# Patient Record
Sex: Male | Born: 1945
Health system: Southern US, Community
[De-identification: ages and names within clinical notes are randomized; demographics above are authoritative.]

## PROBLEM LIST (undated history)

## (undated) DIAGNOSIS — E669 Obesity, unspecified: Secondary | ICD-10-CM

## (undated) DIAGNOSIS — N529 Male erectile dysfunction, unspecified: Secondary | ICD-10-CM

## (undated) DIAGNOSIS — I2510 Atherosclerotic heart disease of native coronary artery without angina pectoris: Secondary | ICD-10-CM

## (undated) DIAGNOSIS — I10 Essential (primary) hypertension: Secondary | ICD-10-CM

## (undated) DIAGNOSIS — G4700 Insomnia, unspecified: Secondary | ICD-10-CM

## (undated) DIAGNOSIS — G4733 Obstructive sleep apnea (adult) (pediatric): Secondary | ICD-10-CM

## (undated) DIAGNOSIS — E781 Pure hyperglyceridemia: Secondary | ICD-10-CM

## (undated) DIAGNOSIS — Z86010 Personal history of colonic polyps: Secondary | ICD-10-CM

## (undated) DIAGNOSIS — R748 Abnormal levels of other serum enzymes: Secondary | ICD-10-CM

## (undated) DIAGNOSIS — I219 Acute myocardial infarction, unspecified: Secondary | ICD-10-CM

## (undated) DIAGNOSIS — C434 Malignant melanoma of scalp and neck: Secondary | ICD-10-CM

## (undated) DIAGNOSIS — T7840XA Allergy, unspecified, initial encounter: Secondary | ICD-10-CM

## (undated) DIAGNOSIS — C439 Malignant melanoma of skin, unspecified: Secondary | ICD-10-CM

## (undated) DIAGNOSIS — K5790 Diverticulosis of intestine, part unspecified, without perforation or abscess without bleeding: Secondary | ICD-10-CM

## (undated) DIAGNOSIS — E785 Hyperlipidemia, unspecified: Secondary | ICD-10-CM

## (undated) HISTORY — DX: Male erectile dysfunction, unspecified: N52.9

## (undated) HISTORY — PX: INGUINAL HERNIA REPAIR: SUR1180

## (undated) HISTORY — DX: Obesity, unspecified: E66.9

## (undated) HISTORY — PX: FINGER SURGERY: SHX640

## (undated) HISTORY — PX: CHOLECYSTECTOMY: SHX55

## (undated) HISTORY — DX: Allergy, unspecified, initial encounter: T78.40XA

## (undated) HISTORY — DX: Abnormal levels of other serum enzymes: R74.8

## (undated) HISTORY — DX: Essential (primary) hypertension: I10

## (undated) HISTORY — DX: Obstructive sleep apnea (adult) (pediatric): G47.33

## (undated) HISTORY — DX: Pure hyperglyceridemia: E78.1

## (undated) HISTORY — DX: Malignant melanoma of skin, unspecified: C43.9

## (undated) HISTORY — DX: Malignant melanoma of scalp and neck: C43.4

## (undated) HISTORY — PX: ORTHOPEDIC SURGERY: SHX850

## (undated) HISTORY — DX: Insomnia, unspecified: G47.00

## (undated) HISTORY — DX: Personal history of colonic polyps: Z86.010

## (undated) HISTORY — PX: CORONARY STENT PLACEMENT: SHX1402

## (undated) HISTORY — DX: Hyperlipidemia, unspecified: E78.5

## (undated) HISTORY — DX: Atherosclerotic heart disease of native coronary artery without angina pectoris: I25.10

## (undated) HISTORY — DX: Acute myocardial infarction, unspecified: I21.9

## (undated) HISTORY — PX: FOOT SURGERY: SHX648

## (undated) HISTORY — PX: NECK SURGERY: SHX720

## (undated) HISTORY — PX: CORONARY ANGIOPLASTY: SHX604

## (undated) HISTORY — PX: OTHER SURGICAL HISTORY: SHX169

## (undated) HISTORY — DX: Diverticulosis of intestine, part unspecified, without perforation or abscess without bleeding: K57.90

---

## 1997-12-18 ENCOUNTER — Encounter (HOSPITAL_COMMUNITY): Admission: RE | Admit: 1997-12-18 | Discharge: 1998-03-18 | Payer: Self-pay | Admitting: Cardiology

## 2000-01-11 ENCOUNTER — Other Ambulatory Visit: Admission: RE | Admit: 2000-01-11 | Discharge: 2000-01-11 | Payer: Self-pay | Admitting: Urology

## 2001-11-29 ENCOUNTER — Ambulatory Visit (HOSPITAL_COMMUNITY): Admission: RE | Admit: 2001-11-29 | Discharge: 2001-11-29 | Payer: Self-pay | Admitting: Cardiology

## 2001-11-29 ENCOUNTER — Encounter: Payer: Self-pay | Admitting: Cardiology

## 2003-02-05 DIAGNOSIS — Z860100 Personal history of colon polyps, unspecified: Secondary | ICD-10-CM

## 2003-02-05 HISTORY — DX: Personal history of colonic polyps: Z86.010

## 2003-02-05 HISTORY — DX: Personal history of colon polyps, unspecified: Z86.0100

## 2003-03-04 ENCOUNTER — Encounter: Admission: RE | Admit: 2003-03-04 | Discharge: 2003-06-02 | Payer: Self-pay | Admitting: Internal Medicine

## 2003-07-08 ENCOUNTER — Encounter: Admission: RE | Admit: 2003-07-08 | Discharge: 2003-10-06 | Payer: Self-pay | Admitting: Internal Medicine

## 2004-07-20 ENCOUNTER — Ambulatory Visit: Payer: Self-pay

## 2004-07-22 ENCOUNTER — Ambulatory Visit: Payer: Self-pay | Admitting: Cardiology

## 2004-08-05 ENCOUNTER — Ambulatory Visit: Payer: Self-pay | Admitting: Internal Medicine

## 2004-09-07 ENCOUNTER — Ambulatory Visit: Payer: Self-pay | Admitting: Cardiology

## 2005-06-28 ENCOUNTER — Ambulatory Visit: Payer: Self-pay | Admitting: Cardiology

## 2005-07-01 ENCOUNTER — Ambulatory Visit: Payer: Self-pay | Admitting: Cardiology

## 2005-07-04 ENCOUNTER — Ambulatory Visit: Payer: Self-pay

## 2005-07-19 ENCOUNTER — Ambulatory Visit: Payer: Self-pay | Admitting: Internal Medicine

## 2005-10-07 ENCOUNTER — Ambulatory Visit: Payer: Self-pay | Admitting: Internal Medicine

## 2005-10-12 ENCOUNTER — Ambulatory Visit (HOSPITAL_BASED_OUTPATIENT_CLINIC_OR_DEPARTMENT_OTHER): Admission: RE | Admit: 2005-10-12 | Discharge: 2005-10-12 | Payer: Self-pay | Admitting: Surgery

## 2006-01-24 ENCOUNTER — Ambulatory Visit: Payer: Self-pay | Admitting: Gastroenterology

## 2006-02-20 ENCOUNTER — Ambulatory Visit: Payer: Self-pay | Admitting: Gastroenterology

## 2006-03-06 ENCOUNTER — Ambulatory Visit: Payer: Self-pay | Admitting: Gastroenterology

## 2006-04-07 ENCOUNTER — Ambulatory Visit: Payer: Self-pay | Admitting: Cardiology

## 2006-04-11 ENCOUNTER — Ambulatory Visit: Payer: Self-pay | Admitting: Cardiology

## 2006-08-09 ENCOUNTER — Ambulatory Visit: Payer: Self-pay | Admitting: Internal Medicine

## 2006-09-13 ENCOUNTER — Ambulatory Visit: Payer: Self-pay | Admitting: Cardiology

## 2006-09-14 ENCOUNTER — Ambulatory Visit: Payer: Self-pay | Admitting: Cardiology

## 2006-10-03 ENCOUNTER — Ambulatory Visit: Payer: Self-pay | Admitting: Internal Medicine

## 2006-10-09 ENCOUNTER — Emergency Department (HOSPITAL_COMMUNITY): Admission: EM | Admit: 2006-10-09 | Discharge: 2006-10-09 | Payer: Self-pay | Admitting: Family Medicine

## 2006-10-17 ENCOUNTER — Encounter: Payer: Self-pay | Admitting: Pulmonary Disease

## 2006-10-17 ENCOUNTER — Ambulatory Visit (HOSPITAL_BASED_OUTPATIENT_CLINIC_OR_DEPARTMENT_OTHER): Admission: RE | Admit: 2006-10-17 | Discharge: 2006-10-17 | Payer: Self-pay | Admitting: Internal Medicine

## 2006-10-24 ENCOUNTER — Ambulatory Visit: Payer: Self-pay | Admitting: Pulmonary Disease

## 2006-11-09 ENCOUNTER — Ambulatory Visit: Payer: Self-pay

## 2006-11-09 ENCOUNTER — Ambulatory Visit: Payer: Self-pay | Admitting: Cardiology

## 2006-12-11 ENCOUNTER — Ambulatory Visit: Payer: Self-pay | Admitting: Pulmonary Disease

## 2007-01-19 ENCOUNTER — Ambulatory Visit: Payer: Self-pay | Admitting: Pulmonary Disease

## 2007-02-13 ENCOUNTER — Ambulatory Visit: Payer: Self-pay | Admitting: Pulmonary Disease

## 2007-03-13 ENCOUNTER — Encounter: Payer: Self-pay | Admitting: Internal Medicine

## 2007-04-20 ENCOUNTER — Ambulatory Visit: Payer: Self-pay | Admitting: Cardiology

## 2007-04-20 LAB — CONVERTED CEMR LAB
ALT: 26 units/L (ref 0–53)
AST: 31 units/L (ref 0–37)
Albumin: 4 g/dL (ref 3.5–5.2)
Alkaline Phosphatase: 77 units/L (ref 39–117)
BUN: 17 mg/dL (ref 6–23)
CO2: 28 meq/L (ref 19–32)
Calcium: 9.4 mg/dL (ref 8.4–10.5)
Chloride: 104 meq/L (ref 96–112)
Cholesterol: 132 mg/dL (ref 0–200)
Creatinine, Ser: 0.9 mg/dL (ref 0.4–1.5)
Direct LDL: 69 mg/dL
GFR calc Af Amer: 110 mL/min
GFR calc non Af Amer: 91 mL/min
Glucose, Bld: 155 mg/dL — ABNORMAL HIGH (ref 70–99)
HDL: 30 mg/dL — ABNORMAL LOW (ref >39.0)
Potassium: 4.1 meq/L (ref 3.5–5.1)
Sodium: 139 meq/L (ref 135–145)
Total Bilirubin: 0.7 mg/dL (ref 0.3–1.2)
Total CHOL/HDL Ratio: 4.4
Total Protein: 6.2 g/dL (ref 6.0–8.3)
Triglycerides: 204 mg/dL (ref 0–149)
VLDL: 41 mg/dL — ABNORMAL HIGH (ref 0–40)

## 2007-04-24 ENCOUNTER — Ambulatory Visit: Payer: Self-pay | Admitting: Cardiology

## 2007-05-09 DIAGNOSIS — K5730 Diverticulosis of large intestine without perforation or abscess without bleeding: Secondary | ICD-10-CM | POA: Insufficient documentation

## 2007-05-09 DIAGNOSIS — I252 Old myocardial infarction: Secondary | ICD-10-CM

## 2007-05-09 DIAGNOSIS — E785 Hyperlipidemia, unspecified: Secondary | ICD-10-CM | POA: Insufficient documentation

## 2007-05-09 DIAGNOSIS — E1151 Type 2 diabetes mellitus with diabetic peripheral angiopathy without gangrene: Secondary | ICD-10-CM

## 2007-07-23 ENCOUNTER — Ambulatory Visit: Payer: Self-pay | Admitting: Pulmonary Disease

## 2007-08-20 ENCOUNTER — Ambulatory Visit: Payer: Self-pay | Admitting: Cardiology

## 2007-09-05 ENCOUNTER — Encounter: Payer: Self-pay | Admitting: Pulmonary Disease

## 2007-09-26 ENCOUNTER — Encounter: Payer: Self-pay | Admitting: Internal Medicine

## 2007-10-30 ENCOUNTER — Telehealth: Payer: Self-pay | Admitting: Pulmonary Disease

## 2007-11-26 ENCOUNTER — Ambulatory Visit: Payer: Self-pay | Admitting: Internal Medicine

## 2007-11-26 DIAGNOSIS — F528 Other sexual dysfunction not due to a substance or known physiological condition: Secondary | ICD-10-CM | POA: Insufficient documentation

## 2008-01-28 ENCOUNTER — Emergency Department (HOSPITAL_COMMUNITY): Admission: EM | Admit: 2008-01-28 | Discharge: 2008-01-28 | Payer: Self-pay | Admitting: Emergency Medicine

## 2008-01-29 ENCOUNTER — Ambulatory Visit: Payer: Self-pay | Admitting: Internal Medicine

## 2008-01-29 ENCOUNTER — Telehealth: Payer: Self-pay | Admitting: Internal Medicine

## 2008-02-07 ENCOUNTER — Telehealth (INDEPENDENT_AMBULATORY_CARE_PROVIDER_SITE_OTHER): Payer: Self-pay | Admitting: *Deleted

## 2008-02-15 ENCOUNTER — Telehealth: Payer: Self-pay | Admitting: *Deleted

## 2008-02-21 ENCOUNTER — Encounter: Admission: RE | Admit: 2008-02-21 | Discharge: 2008-02-21 | Payer: Self-pay | Admitting: Internal Medicine

## 2008-03-04 ENCOUNTER — Telehealth: Payer: Self-pay | Admitting: Internal Medicine

## 2008-03-27 ENCOUNTER — Ambulatory Visit: Payer: Self-pay | Admitting: Cardiology

## 2008-03-27 LAB — CONVERTED CEMR LAB
ALT: 35 units/L (ref 0–53)
AST: 36 units/L (ref 0–37)
Albumin: 4.1 g/dL (ref 3.5–5.2)
Alkaline Phosphatase: 72 units/L (ref 39–117)
Bilirubin, Direct: 0.1 mg/dL (ref 0.0–0.3)
Cholesterol: 148 mg/dL (ref 0–200)
HDL: 30.4 mg/dL — ABNORMAL LOW (ref >39.0)
LDL Cholesterol: 83 mg/dL (ref 0–99)
Total Bilirubin: 0.7 mg/dL (ref 0.3–1.2)
Total CHOL/HDL Ratio: 4.9
Total Protein: 6.9 g/dL (ref 6.0–8.3)
Triglycerides: 171 mg/dL — ABNORMAL HIGH (ref 0–149)
VLDL: 34 mg/dL (ref 0–40)

## 2008-06-24 ENCOUNTER — Ambulatory Visit: Payer: Self-pay | Admitting: Internal Medicine

## 2008-06-30 ENCOUNTER — Telehealth: Payer: Self-pay | Admitting: Internal Medicine

## 2008-07-16 ENCOUNTER — Telehealth: Payer: Self-pay | Admitting: Internal Medicine

## 2008-09-08 ENCOUNTER — Telehealth: Payer: Self-pay | Admitting: Internal Medicine

## 2008-09-16 ENCOUNTER — Ambulatory Visit: Payer: Self-pay | Admitting: Internal Medicine

## 2008-09-16 LAB — CONVERTED CEMR LAB
ALT: 37 units/L (ref 0–53)
Albumin: 3.8 g/dL (ref 3.5–5.2)
Alkaline Phosphatase: 69 units/L (ref 39–117)
BUN: 16 mg/dL (ref 6–23)
CO2: 30 meq/L (ref 19–32)
Cholesterol: 138 mg/dL (ref 0–200)
GFR calc Af Amer: 110 mL/min
Glucose, Bld: 134 mg/dL — ABNORMAL HIGH (ref 70–99)
HDL: 31.3 mg/dL — ABNORMAL LOW (ref >39.0)
Potassium: 4.3 meq/L (ref 3.5–5.1)
Total Protein: 6.4 g/dL (ref 6.0–8.3)
VLDL: 38 mg/dL (ref 0–40)

## 2008-09-23 ENCOUNTER — Ambulatory Visit: Payer: Self-pay | Admitting: Internal Medicine

## 2008-09-23 DIAGNOSIS — E669 Obesity, unspecified: Secondary | ICD-10-CM

## 2008-10-20 ENCOUNTER — Ambulatory Visit: Payer: Self-pay | Admitting: Cardiology

## 2008-11-04 ENCOUNTER — Telehealth (INDEPENDENT_AMBULATORY_CARE_PROVIDER_SITE_OTHER): Payer: Self-pay | Admitting: *Deleted

## 2008-11-28 ENCOUNTER — Ambulatory Visit: Payer: Self-pay | Admitting: Pulmonary Disease

## 2008-11-28 DIAGNOSIS — G4733 Obstructive sleep apnea (adult) (pediatric): Secondary | ICD-10-CM | POA: Insufficient documentation

## 2008-11-28 DIAGNOSIS — G4700 Insomnia, unspecified: Secondary | ICD-10-CM

## 2008-12-04 ENCOUNTER — Telehealth: Payer: Self-pay | Admitting: Internal Medicine

## 2009-01-23 ENCOUNTER — Ambulatory Visit: Payer: Self-pay | Admitting: Internal Medicine

## 2009-01-23 LAB — CONVERTED CEMR LAB
ALT: 31 units/L (ref 0–53)
AST: 33 units/L (ref 0–37)
Alkaline Phosphatase: 76 units/L (ref 39–117)
Bilirubin, Direct: 0 mg/dL (ref 0.0–0.3)
CO2: 29 meq/L (ref 19–32)
Chloride: 107 meq/L (ref 96–112)
Creatinine, Ser: 0.8 mg/dL (ref 0.4–1.5)
Potassium: 3.9 meq/L (ref 3.5–5.1)
Sodium: 142 meq/L (ref 135–145)
Total Bilirubin: 0.4 mg/dL (ref 0.3–1.2)
Total CHOL/HDL Ratio: 4
Total Protein: 6.5 g/dL (ref 6.0–8.3)
Triglycerides: 106 mg/dL (ref 0.0–149.0)

## 2009-02-13 ENCOUNTER — Ambulatory Visit: Payer: Self-pay | Admitting: Internal Medicine

## 2009-02-13 LAB — CONVERTED CEMR LAB
Eosinophils Absolute: 0.2 10*3/uL (ref 0.0–0.7)
HCT: 39.3 % (ref 39.0–52.0)
Lymphs Abs: 1.7 10*3/uL (ref 0.7–4.0)
MCHC: 34.6 g/dL (ref 30.0–36.0)
MCV: 90.9 fL (ref 78.0–100.0)
Monocytes Absolute: 0.5 10*3/uL (ref 0.1–1.0)
Neutrophils Relative %: 56.1 % (ref 43.0–77.0)
Platelets: 220 10*3/uL (ref 150.0–400.0)
RDW: 12.1 % (ref 11.5–14.6)
TSH: 0.99 microintl units/mL (ref 0.35–5.50)

## 2009-03-05 ENCOUNTER — Telehealth: Payer: Self-pay | Admitting: Internal Medicine

## 2009-04-14 ENCOUNTER — Encounter: Payer: Self-pay | Admitting: Internal Medicine

## 2009-04-17 ENCOUNTER — Telehealth: Payer: Self-pay | Admitting: Internal Medicine

## 2009-05-30 DIAGNOSIS — I2510 Atherosclerotic heart disease of native coronary artery without angina pectoris: Secondary | ICD-10-CM | POA: Insufficient documentation

## 2009-05-30 DIAGNOSIS — I10 Essential (primary) hypertension: Secondary | ICD-10-CM | POA: Insufficient documentation

## 2009-06-04 ENCOUNTER — Telehealth: Payer: Self-pay | Admitting: Internal Medicine

## 2009-06-10 ENCOUNTER — Encounter: Payer: Self-pay | Admitting: Internal Medicine

## 2009-06-16 ENCOUNTER — Ambulatory Visit: Payer: Self-pay | Admitting: Cardiology

## 2009-06-16 ENCOUNTER — Ambulatory Visit: Payer: Self-pay

## 2009-06-18 ENCOUNTER — Ambulatory Visit: Payer: Self-pay | Admitting: Internal Medicine

## 2009-06-23 ENCOUNTER — Ambulatory Visit: Payer: Self-pay | Admitting: Internal Medicine

## 2009-06-24 ENCOUNTER — Ambulatory Visit: Payer: Self-pay | Admitting: Internal Medicine

## 2009-06-26 ENCOUNTER — Telehealth: Payer: Self-pay | Admitting: Internal Medicine

## 2009-06-28 ENCOUNTER — Emergency Department (HOSPITAL_COMMUNITY): Admission: EM | Admit: 2009-06-28 | Discharge: 2009-06-29 | Payer: Self-pay | Admitting: Emergency Medicine

## 2009-07-02 ENCOUNTER — Telehealth: Payer: Self-pay | Admitting: Internal Medicine

## 2009-07-06 ENCOUNTER — Ambulatory Visit: Payer: Self-pay | Admitting: Internal Medicine

## 2009-07-16 ENCOUNTER — Telehealth: Payer: Self-pay | Admitting: Internal Medicine

## 2009-07-17 ENCOUNTER — Telehealth: Payer: Self-pay | Admitting: Internal Medicine

## 2009-07-20 ENCOUNTER — Encounter: Payer: Self-pay | Admitting: Internal Medicine

## 2009-07-21 ENCOUNTER — Telehealth (INDEPENDENT_AMBULATORY_CARE_PROVIDER_SITE_OTHER): Payer: Self-pay | Admitting: *Deleted

## 2009-08-03 ENCOUNTER — Ambulatory Visit: Payer: Self-pay | Admitting: Internal Medicine

## 2009-08-03 LAB — CONVERTED CEMR LAB
ALT: 35 units/L (ref 0–53)
Alkaline Phosphatase: 91 units/L (ref 39–117)
BUN: 13 mg/dL (ref 6–23)
Bilirubin, Direct: 0.1 mg/dL (ref 0.0–0.3)
Chloride: 105 meq/L (ref 96–112)
Cholesterol: 110 mg/dL (ref 0–200)
Creatinine, Ser: 0.9 mg/dL (ref 0.4–1.5)
Glucose, Bld: 139 mg/dL — ABNORMAL HIGH (ref 70–99)
Hgb A1c MFr Bld: 7.1 % — ABNORMAL HIGH (ref 4.6–6.5)
LDL Cholesterol: 53 mg/dL (ref 0–99)
Potassium: 4.7 meq/L (ref 3.5–5.1)
Total Bilirubin: 0.6 mg/dL (ref 0.3–1.2)
Total Protein: 6.6 g/dL (ref 6.0–8.3)
VLDL: 30 mg/dL (ref 0.0–40.0)

## 2009-08-10 ENCOUNTER — Ambulatory Visit: Payer: Self-pay | Admitting: Internal Medicine

## 2009-08-26 ENCOUNTER — Encounter: Admission: RE | Admit: 2009-08-26 | Discharge: 2009-09-08 | Payer: Self-pay | Admitting: Internal Medicine

## 2009-08-28 ENCOUNTER — Encounter (INDEPENDENT_AMBULATORY_CARE_PROVIDER_SITE_OTHER): Payer: Self-pay | Admitting: *Deleted

## 2009-09-08 ENCOUNTER — Encounter: Payer: Self-pay | Admitting: Internal Medicine

## 2009-11-09 ENCOUNTER — Telehealth: Payer: Self-pay | Admitting: Internal Medicine

## 2009-11-10 ENCOUNTER — Encounter: Payer: Self-pay | Admitting: Internal Medicine

## 2009-11-12 ENCOUNTER — Telehealth (INDEPENDENT_AMBULATORY_CARE_PROVIDER_SITE_OTHER): Payer: Self-pay | Admitting: *Deleted

## 2009-11-27 ENCOUNTER — Ambulatory Visit: Payer: Self-pay | Admitting: Pulmonary Disease

## 2009-12-08 ENCOUNTER — Ambulatory Visit: Payer: Self-pay | Admitting: Cardiology

## 2009-12-09 ENCOUNTER — Ambulatory Visit: Payer: Self-pay | Admitting: Internal Medicine

## 2009-12-09 DIAGNOSIS — R5383 Other fatigue: Secondary | ICD-10-CM | POA: Insufficient documentation

## 2009-12-09 DIAGNOSIS — R5381 Other malaise: Secondary | ICD-10-CM

## 2009-12-14 ENCOUNTER — Encounter: Payer: Self-pay | Admitting: Internal Medicine

## 2009-12-14 LAB — CONVERTED CEMR LAB
AST: 34 units/L (ref 0–37)
Alkaline Phosphatase: 97 units/L (ref 39–117)
BUN: 13 mg/dL (ref 6–23)
Basophils Absolute: 0 10*3/uL (ref 0.0–0.1)
Bilirubin, Direct: 0 mg/dL (ref 0.0–0.3)
Chloride: 105 meq/L (ref 96–112)
Cholesterol: 147 mg/dL (ref 0–200)
Creatinine, Ser: 0.9 mg/dL (ref 0.4–1.5)
Eosinophils Absolute: 0.1 10*3/uL (ref 0.0–0.7)
Glucose, Bld: 134 mg/dL — ABNORMAL HIGH (ref 70–99)
HCT: 42.7 % (ref 39.0–52.0)
Hemoglobin: 14.2 g/dL (ref 13.0–17.0)
Lymphs Abs: 1.9 10*3/uL (ref 0.7–4.0)
MCHC: 33.2 g/dL (ref 30.0–36.0)
MCV: 92.9 fL (ref 78.0–100.0)
Monocytes Absolute: 0.4 10*3/uL (ref 0.1–1.0)
Monocytes Relative: 8 % (ref 3.0–12.0)
Neutro Abs: 3 10*3/uL (ref 1.4–7.7)
Platelets: 221 10*3/uL (ref 150.0–400.0)
Potassium: 4.3 meq/L (ref 3.5–5.1)
RDW: 12.4 % (ref 11.5–14.6)
TSH: 1.65 microintl units/mL (ref 0.35–5.50)
Total Bilirubin: 0.5 mg/dL (ref 0.3–1.2)
Total CHOL/HDL Ratio: 4
VLDL: 45 mg/dL — ABNORMAL HIGH (ref 0.0–40.0)

## 2010-01-04 ENCOUNTER — Encounter: Admission: RE | Admit: 2010-01-04 | Discharge: 2010-04-04 | Payer: Self-pay | Admitting: Internal Medicine

## 2010-01-05 ENCOUNTER — Encounter: Payer: Self-pay | Admitting: Internal Medicine

## 2010-01-15 ENCOUNTER — Telehealth (INDEPENDENT_AMBULATORY_CARE_PROVIDER_SITE_OTHER): Payer: Self-pay | Admitting: *Deleted

## 2010-01-23 ENCOUNTER — Ambulatory Visit: Payer: Self-pay | Admitting: Family Medicine

## 2010-01-23 DIAGNOSIS — F341 Dysthymic disorder: Secondary | ICD-10-CM | POA: Insufficient documentation

## 2010-01-25 ENCOUNTER — Telehealth (INDEPENDENT_AMBULATORY_CARE_PROVIDER_SITE_OTHER): Payer: Self-pay | Admitting: *Deleted

## 2010-02-16 ENCOUNTER — Telehealth: Payer: Self-pay | Admitting: Family Medicine

## 2010-02-17 ENCOUNTER — Encounter: Payer: Self-pay | Admitting: Pulmonary Disease

## 2010-03-13 ENCOUNTER — Encounter: Payer: Self-pay | Admitting: Pulmonary Disease

## 2010-04-05 ENCOUNTER — Telehealth: Payer: Self-pay | Admitting: Pulmonary Disease

## 2010-04-06 ENCOUNTER — Encounter: Admission: RE | Admit: 2010-04-06 | Discharge: 2010-06-17 | Payer: Self-pay | Admitting: Internal Medicine

## 2010-04-16 ENCOUNTER — Ambulatory Visit: Payer: Self-pay | Admitting: Internal Medicine

## 2010-04-19 LAB — CONVERTED CEMR LAB
ALT: 37 units/L (ref 0–53)
BUN: 15 mg/dL (ref 6–23)
Basophils Absolute: 0 10*3/uL (ref 0.0–0.1)
CO2: 25 meq/L (ref 19–32)
Calcium: 9.2 mg/dL (ref 8.4–10.5)
Eosinophils Relative: 1.7 % (ref 0.0–5.0)
Glucose, Bld: 218 mg/dL — ABNORMAL HIGH (ref 70–99)
HDL: 30.5 mg/dL — ABNORMAL LOW (ref >39.00)
Hemoglobin: 14.4 g/dL (ref 13.0–17.0)
Lymphocytes Relative: 30.9 % (ref 12.0–46.0)
Monocytes Relative: 8.3 % (ref 3.0–12.0)
Platelets: 251 10*3/uL (ref 150.0–400.0)
RDW: 12.5 % (ref 11.5–14.6)
Sodium: 133 meq/L — ABNORMAL LOW (ref 135–145)
TSH: 1.31 microintl units/mL (ref 0.35–5.50)
Testosterone: 239.35 ng/dL — ABNORMAL LOW (ref 350.00–890.00)
WBC: 6.1 10*3/uL (ref 4.5–10.5)

## 2010-05-01 ENCOUNTER — Encounter: Payer: Self-pay | Admitting: Pulmonary Disease

## 2010-05-10 ENCOUNTER — Telehealth: Payer: Self-pay | Admitting: Internal Medicine

## 2010-07-01 ENCOUNTER — Telehealth: Payer: Self-pay | Admitting: Internal Medicine

## 2010-07-12 ENCOUNTER — Telehealth: Payer: Self-pay | Admitting: Pulmonary Disease

## 2010-08-06 ENCOUNTER — Ambulatory Visit: Payer: Self-pay | Admitting: Internal Medicine

## 2010-10-19 NOTE — Assessment & Plan Note (Signed)
Summary: FUP//CCM   Vital Signs:  Patient profile:   65 year old male Weight:      227 pounds Temp:     98.6 degrees F oral Pulse rate:   80 / minute Pulse rhythm:   regular BP sitting:   160 / 84  (left arm) Cuff size:   regular  Vitals Entered By: Kern Reap CMA Duncan Dull) (April 16, 2010 11:34 AM)  CC: follow-up visit Is Patient Diabetic? Yes Did you bring your meter with you today? No Pain Assessment Patient in pain? no        Primary Care Provider:  Ruthie Berch  CC:  follow-up visit.  History of Present Illness: OSA---reviewed communications with Dr. Shelle Iron  admits to fatigue---ongoing for months--years  mood---doing well on lower dose of celexa (reviewed dr schaller's note)  DM--tolerating meds--not checking CBGs except rarely (thinks in the 140 range)  All other systems reviewed and were negative   Preventive Screening-Counseling & Management  Alcohol-Tobacco     Smoking Status: quit     Year Quit: 1991  Allergies: 1)  ! Codeine Phosphate (Codeine Phosphate) 2)  Penicillin G Potassium (Penicillin G Potassium) 3)  Oracea (Doxycycline (Rosacea))  Social History: Smoking Status:  quit  Physical Exam  General:  alert and well-developed.   Head:  normocephalic and atraumatic.   Eyes:  pupils equal and pupils round.   Ears:  R ear normal and L ear normal.   Neck:  No deformities, masses, or tenderness noted. Chest Wall:  no deformities and no masses.   Lungs:  normal respiratory effort and no intercostal retractions.   Heart:  normal rate and regular rhythm.   Abdomen:  soft and non-tender. obese  Msk:  No deformity or scoliosis noted of thoracic or lumbar spine.   Neurologic:  cranial nerves II-XII intact and gait normal.    Diabetes Management Exam:    Eye Exam:       Eye Exam done elsewhere          Date: 04/08/2010          Results: normal          Done by: Emily Filbert   Impression & Recommendations:  Problem # 1:  FATIGUE (ICD-780.79)  check  testosterone  could all be related to OSA  Orders: TLB-CBC Platelet - w/Differential (85025-CBCD) TLB-Testosterone, Total (84403-TESTO)  Problem # 2:  HYPERTENSION, UNSPECIFIED (ICD-401.9) not controlled--increase meds side effects discussed His updated medication list for this problem includes:    Lisinopril 5 Mg Tabs (Lisinopril) .Marland Kitchen... Take 1 tablet by mouth once a day  BP today: 160/84 Prior BP: 152/80 (01/23/2010)  Prior 10 Yr Risk Heart Disease: N/A (06/16/2009)  Labs Reviewed: K+: 4.3 (12/09/2009) Creat: : 0.9 (12/09/2009)   Chol: 147 (12/09/2009)   HDL: 33.50 (12/09/2009)   LDL: 53 (08/03/2009)   TG: 225.0 (12/09/2009)  Problem # 3:  DIABETES MELLITUS, TYPE II (ICD-250.00)  check labs today continue current medications  His updated medication list for this problem includes:    Metformin Hcl 1000 Mg Tabs (Metformin hcl) .Marland Kitchen... Take 1 tablet by mouth two times a day    Lisinopril 5 Mg Tabs (Lisinopril) .Marland Kitchen... Take 1 tablet by mouth once a day    Aspirin Ec 325 Mg Tbec (Aspirin) ..... Once daily  Labs Reviewed: Creat: 0.9 (12/09/2009)     Last Eye Exam: normal (04/08/2010) Reviewed HgBA1c results: 8.4 (12/09/2009)  7.1 (08/03/2009)  Problem # 4:  HYPERLIPIDEMIA (ICD-272.4)  controlled continue current  medications  His updated medication list for this problem includes:    Crestor 20 Mg Tabs (Rosuvastatin calcium) .Marland Kitchen... Take 1 tablet by mouth at bedtime    Niaspan 1000 Mg Tbcr (Niacin (antihyperlipidemic)) .Marland Kitchen... Take 1 tablet by mouth daily    Tricor 145 Mg Tabs (Fenofibrate) .Marland Kitchen... Take 1 tablet by mouth once a day  Labs Reviewed: SGOT: 34 (12/09/2009)   SGPT: 41 (12/09/2009)  Prior 10 Yr Risk Heart Disease: N/A (06/16/2009)   HDL:33.50 (12/09/2009), 26.60 (08/03/2009)  LDL:53 (08/03/2009), 66 (01/23/2009)  Chol:147 (12/09/2009), 110 (08/03/2009)  Trig:225.0 (12/09/2009), 150.0 (08/03/2009)  Orders: TLB-TSH (Thyroid Stimulating Hormone) (84443-TSH)  Complete  Medication List: 1)  Onetouch Ultra Test Strp (Glucose blood) .... Once daily as needed 2)  Crestor 20 Mg Tabs (Rosuvastatin calcium) .... Take 1 tablet by mouth at bedtime 3)  Metformin Hcl 1000 Mg Tabs (Metformin hcl) .... Take 1 tablet by mouth two times a day 4)  Lisinopril 5 Mg Tabs (Lisinopril) .... Take 1 tablet by mouth once a day 5)  Rozerem 8 Mg Tabs (Ramelteon) .... Take 1 tab by mouth at bedtime 6)  Niaspan 1000 Mg Tbcr (Niacin (antihyperlipidemic)) .... Take 1 tablet by mouth daily 7)  Flax Seed Oil 1000 Mg Caps (Flaxseed (linseed)) .... Two times a day 8)  Omega-3 350 Mg Caps (Omega-3 fatty acids) .... Two times a day 9)  Centrum Silver Tabs (Multiple vitamins-minerals) .... Once daily 10)  Bd U/f Mini Pen Needle 31g X 5 Mm Misc (Insulin pen needle) .... Use as directed 11)  Aspirin Ec 325 Mg Tbec (Aspirin) .... Once daily 12)  Fluticasone Propionate 50 Mcg/act Susp (Fluticasone propionate) .... 2 sprays each nostril once daily 13)  Onetouch Ultrasoft Lancets Misc (Lancets) .... Use as directed once daily 14)  Tricor 145 Mg Tabs (Fenofibrate) .... Take 1 tablet by mouth once a day 15)  Celexa 20 Mg Tabs (Citalopram hydrobromide) .... Take 1  tablet by mouth once a day 16)  Vitamin B-12 1000 Mcg Tabs (Cyanocobalamin) .... Take one tab by mouth once daily  Other Orders: Venipuncture (16109) TLB-A1C / Hgb A1C (Glycohemoglobin) (83036-A1C) TLB-BMP (Basic Metabolic Panel-BMET) (80048-METABOL) TLB-Lipid Panel (80061-LIPID) TLB-ALT (SGPT) (84460-ALT)  Patient Instructions: 1)  Please schedule a follow-up appointment in 3 months. Prescriptions: LISINOPRIL 5 MG  TABS (LISINOPRIL) Take 1 tablet by mouth once a day  #90 x 3   Entered and Authorized by:   Birdie Sons MD   Signed by:   Birdie Sons MD on 04/16/2010   Method used:   Print then Give to Patient   RxID:   [id number]

## 2010-10-19 NOTE — Progress Notes (Signed)
Summary: Call-A-Nurse Report  Phone Note Outgoing Call   Summary of Call: call patient--- see if he needs OV  Follow-up for Phone Call        Pt is better and will call if needs OV. Follow-up by: Lynann Beaver CMA,  Jan 25, 2010 1:25 PM      [facility] Triage Call Report Triage Record Num: [id number] Operator: Elita Boone Patient Name: Brent [name] Call Date & Time: 01/23/2010 11:51:55AM Patient Phone: [phone] PCP: Valetta Mole. Swords Patient Gender: Male PCP Fax : [phone] Patient DOB: 1946/05/23 Practice Name: [facility] Reason for Call: Wife calling to report that pt is having abdominal pain "due to stress". Pt is not sleeping will. Pt has had some nausea. Wife reports that pt is on his way to office. Office back line called and pt is being seen. Wife notiifed. Home care advice given for abdominla pain. No emergent s/s. Protocol(s) Used: Abdominal Pain / Discomfort Recommended Outcome per Protocol: See Provider within 72 Hours Reason for Outcome: All other situations Care Advice:  ~ Call provider if symptoms do not improve or symptoms worsen after trying home care measures. 01/23/2010 12:04:03PM Page 1 of 1 [email or web address]

## 2010-10-19 NOTE — Assessment & Plan Note (Signed)
Summary: 4 MTH F/U // [facility] BMP/NJR   Vital Signs:  Patient profile:   65 year old male Weight:      234 pounds Temp:     98.1 degrees F oral Pulse rate:   68 / minute BP sitting:   130 / 80  (left arm) Cuff size:   large  Vitals Entered By: Kern Reap CMA Duncan Dull) (December 09, 2009 11:48 AM)  CC: follow-up visit Is Patient Diabetic? Yes Did you bring your meter with you today? No   CC:  follow-up visit.  History of Present Illness:  Follow-Up Visit      This is a 65 year old man who presents for Follow-up visit.  The patient denies chest pain and palpitations.  Since the last visit the patient notes no new problems or concerns except notes fatigue---ongoing for months, has seen dr. Tedra Senegal and dr clance (adjusting CPAP).  The patient reports taking meds as prescribed.  When questioned about possible medication side effects, the patient notes none.    DM---CBGs in the a.m. 140-180. later in the day CBG can be as high as 200  All other systems reviewed and were negative   Current Problems (verified): 1)  Cad  (ICD-414.00) 2)  Myocardial Infarction, Hx of  (ICD-412) 3)  Hypertension, Unspecified  (ICD-401.9) 4)  Hyperlipidemia  (ICD-272.4) 5)  Obesity  (ICD-278.00) 6)  Diabetes Mellitus, Type II  (ICD-250.00) 7)  Obstructive Sleep Apnea  (ICD-327.23) 8)  Persistent Disorder Initiating/maintaining Sleep  (ICD-307.42) 9)  Erectile Dysfunction  (ICD-302.72) 10)  Nephrolithiasis, Hx of  (ICD-V13.01) 11)  Diverticulosis, Colon  (ICD-562.10)  Current Medications (verified): 1)  Onetouch Ultra Test  Strp (Glucose Blood) .... Once Daily As Needed 2)  Crestor 20 Mg  Tabs (Rosuvastatin Calcium) .... Take 1 Tablet By Mouth At Bedtime 3)  Metformin Hcl 1000 Mg  Tabs (Metformin Hcl) .... Take 1 Tablet By Mouth Two Times A Day 4)  Lisinopril 5 Mg  Tabs (Lisinopril) .... 1/2 Once Daily 5)  Rozerem 8 Mg  Tabs (Ramelteon) .... Take 1 Tab By Mouth At Bedtime 6)  Niaspan 1000 Mg  Tbcr  (Niacin (Antihyperlipidemic)) .... Take 1 Tablet By Mouth Daily 7)  Flax Seed Oil 1000 Mg  Caps (Flaxseed (Linseed)) .... Two Times A Day 8)  Omega-3 350 Mg  Caps (Omega-3 Fatty Acids) .... Two Times A Day 9)  Centrum Silver   Tabs (Multiple Vitamins-Minerals) .... Once Daily 10)  Vitamin B-12 500 Mcg  Tabs (Cyanocobalamin) .... Once Daily 11)  Bd U/f Mini Pen Needle 31g X 5 Mm  Misc (Insulin Pen Needle) .... Use As Directed 12)  Aspirin Ec 325 Mg Tbec (Aspirin) .... Once Daily 13)  Fluticasone Propionate 50 Mcg/act  Susp (Fluticasone Propionate) .... 2 Sprays Each Nostril Once Daily 14)  Onetouch Ultrasoft Lancets  Misc (Lancets) .... Use As Directed Once Daily 15)  Tricor 145 Mg Tabs (Fenofibrate) .... Take 1 Tablet By Mouth Once A Day 16)  Celexa 20 Mg Tabs (Citalopram Hydrobromide) .... Take 1 Tablet By Mouth Once A Day  Allergies (verified): 1)  ! Codeine Phosphate (Codeine Phosphate) 2)  Penicillin G Potassium (Penicillin G Potassium) 3)  Oracea (Doxycycline (Rosacea))  Past History:  Past Medical History: Last updated: 05/30/2009 Current Problems:  CAD (ICD-414.00)- status post multiple percutaneous coronary interventions.  MYOCARDIAL INFARCTION, HX OF (ICD-412) HYPERTENSION, UNSPECIFIED (ICD-401.9) HYPERLIPIDEMIA (ICD-272.4) OBESITY (ICD-278.00) DIABETES MELLITUS, TYPE II (ICD-250.00) FATIGUE (ICD-780.79) OBSTRUCTIVE SLEEP APNEA (ICD-327.23) PERSISTENT DISORDER INITIATING/MAINTAINING  SLEEP (ICD-307.42) ERECTILE DYSFUNCTION (ICD-302.72) NEPHROLITHIASIS, HX OF (ICD-V13.01) DIVERTICULOSIS, COLON (ICD-562.10)  Past Surgical History: Last updated: 05/30/2009 stenting of the right coronary artery with followup rotational  atherectomy.  Cholecystectomy Colonoscopy  coronaryangioplasty   History of multiple orthopedic procedures.  Family History: Last updated: 05/30/2009   Remarkable for mother and father having heart disease.  Social History: Last updated:  05/30/2009  He is married and has children.  He is currently  retired.  He has a history of smoking in the past, but has not done  so in 20 years.  Risk Factors: Smoking Status: quit > 6 months (08/10/2009)  Physical Exam  General:  ow male in nad Head:  normocephalic and atraumatic.   Eyes:  pupils equal and pupils round.   Ears:  R ear normal and L ear normal.   Neck:  No deformities, masses, or tenderness noted. Chest Wall:  no deformities and no masses.   Lungs:  normal respiratory effort and no intercostal retractions.   Heart:  normal rate and regular rhythm.   Abdomen:  soft and non-tender. obese  Msk:  No deformity or scoliosis noted of thoracic or lumbar spine.   Neurologic:  cranial nerves II-XII intact and gait normal.    Diabetes Management Exam:    Eye Exam:       Eye Exam done elsewhere          Date: 03/19/2009          Results: normal          Done by: Emily Filbert   Impression & Recommendations:  Problem # 1:  HYPERTENSION, UNSPECIFIED (ICD-401.9) reasonable contro would improve with weight loss His updated medication list for this problem includes:    Lisinopril 5 Mg Tabs (Lisinopril) .Marland Kitchen... 1/2 once daily  BP today: 130/80 Prior BP: 138/72 (11/27/2009)  Prior 10 Yr Risk Heart Disease: N/A (06/16/2009)  Labs Reviewed: K+: 4.7 (08/03/2009) Creat: : 0.9 (08/03/2009)   Chol: 110 (08/03/2009)   HDL: 26.60 (08/03/2009)   LDL: 53 (08/03/2009)   TG: 150.0 (08/03/2009)  Problem # 2:  HYPERLIPIDEMIA (ICD-272.4) check labs today continue same meds His updated medication list for this problem includes:    Crestor 20 Mg Tabs (Rosuvastatin calcium) .Marland Kitchen... Take 1 tablet by mouth at bedtime    Niaspan 1000 Mg Tbcr (Niacin (antihyperlipidemic)) .Marland Kitchen... Take 1 tablet by mouth daily    Tricor 145 Mg Tabs (Fenofibrate) .Marland Kitchen... Take 1 tablet by mouth once a day  Labs Reviewed: SGOT: 37 (08/03/2009)   SGPT: 35 (08/03/2009)  Prior 10 Yr Risk Heart Disease: N/A (06/16/2009)    HDL:26.60 (08/03/2009), 29.40 (01/23/2009)  LDL:53 (08/03/2009), 66 (01/23/2009)  Chol:110 (08/03/2009), 117 (01/23/2009)  Trig:150.0 (08/03/2009), 106.0 (01/23/2009)  Problem # 3:  CAD (ICD-414.00) asymptomatic continue current medications  His updated medication list for this problem includes:    Lisinopril 5 Mg Tabs (Lisinopril) .Marland Kitchen... 1/2 once daily    Aspirin Ec 325 Mg Tbec (Aspirin) ..... Once daily  ETT Interpretation: abnormal-evidence of ST depression consistent with ischemia (06/16/2009)  ETT Comments: Had mild 1mm St depression in inferor and lateral leads.  No significant ectopy.  Borderline abnormality with good overall exercise tolerance.  treadmill tracings look similar to 2008 tracings with borderline ST segment change. (06/16/2009)  Labs Reviewed: Chol: 110 (08/03/2009)   HDL: 26.60 (08/03/2009)   LDL: 53 (08/03/2009)   TG: 150.0 (08/03/2009)  Problem # 4:  DIABETES MELLITUS, TYPE II (ICD-250.00)  check labs today His updated medication list  for this problem includes:    Metformin Hcl 1000 Mg Tabs (Metformin hcl) .Marland Kitchen... Take 1 tablet by mouth two times a day    Lisinopril 5 Mg Tabs (Lisinopril) .Marland Kitchen... 1/2 once daily    Aspirin Ec 325 Mg Tbec (Aspirin) ..... Once daily  Labs Reviewed: Creat: 0.9 (08/03/2009)     Last Eye Exam: normal (03/19/2009) Reviewed HgBA1c results: 7.1 (08/03/2009)  7.1 (01/23/2009)  Problem # 5:  FATIGUE (ICD-780.79)  Orders: TLB-Hepatic/Liver Function Pnl (80076-HEPATIC) TLB-CBC Platelet - w/Differential (85025-CBCD) TLB-TSH (Thyroid Stimulating Hormone) (84443-TSH) Sedimentation Rate, non-automated (45409) Venipuncture (81191) esr he will check coverage for medicare  Complete Medication List: 1)  Onetouch Ultra Test Strp (Glucose blood) .... Once daily as needed 2)  Crestor 20 Mg Tabs (Rosuvastatin calcium) .... Take 1 tablet by mouth at bedtime 3)  Metformin Hcl 1000 Mg Tabs (Metformin hcl) .... Take 1 tablet by mouth two times a  day 4)  Lisinopril 5 Mg Tabs (Lisinopril) .... 1/2 once daily 5)  Rozerem 8 Mg Tabs (Ramelteon) .... Take 1 tab by mouth at bedtime 6)  Niaspan 1000 Mg Tbcr (Niacin (antihyperlipidemic)) .... Take 1 tablet by mouth daily 7)  Flax Seed Oil 1000 Mg Caps (Flaxseed (linseed)) .... Two times a day 8)  Omega-3 350 Mg Caps (Omega-3 fatty acids) .... Two times a day 9)  Centrum Silver Tabs (Multiple vitamins-minerals) .... Once daily 10)  Vitamin B-12 500 Mcg Tabs (Cyanocobalamin) .... Once daily 11)  Bd U/f Mini Pen Needle 31g X 5 Mm Misc (Insulin pen needle) .... Use as directed 12)  Aspirin Ec 325 Mg Tbec (Aspirin) .... Once daily 13)  Fluticasone Propionate 50 Mcg/act Susp (Fluticasone propionate) .... 2 sprays each nostril once daily 14)  Onetouch Ultrasoft Lancets Misc (Lancets) .... Use as directed once daily 15)  Tricor 145 Mg Tabs (Fenofibrate) .... Take 1 tablet by mouth once a day 16)  Celexa 20 Mg Tabs (Citalopram hydrobromide) .... Take 1 tablet by mouth once a day  Other Orders: TLB-A1C / Hgb A1C (Glycohemoglobin) (83036-A1C) TLB-BMP (Basic Metabolic Panel-BMET) (80048-METABOL) TLB-Lipid Panel (80061-LIPID)   Laboratory Results   Blood Tests     SED rate: 2 mm/hr  Comments: Rita Ohara  December 09, 2009 1:05 PM

## 2010-10-19 NOTE — Progress Notes (Signed)
Summary: Is there an alternative for Rozerem  Phone Note Call from Patient Call back at [phone]   Caller: Patient Summary of Call: pt would like to replace zolpidem-er or zolpidem for rozerem [facility] Initial call taken by: Rickard Patience,  January 15, 2010 2:21 PM  Follow-up for Phone Call        The patient is doing fine on Rozerem but wants to know if Zolpidem ER or Zolpidem would be appropriate alternatives for him. He is paying $60 monthly for the Rozerem and the Zolpidem would be less expensive. He says he will stay on the Rozerem if that will benefit him the most. Please advise. Follow-up by: Michel Bickers CMA,  January 15, 2010 2:35 PM  Additional Follow-up for Phone Call Additional follow up Details #1::        zolpidem is not a good medication to take chronically.  It is addicting, builds tolerance, and can actually worsen sleep apnea.  Rozerem doesn't do any of these things.  The other option is to wean yourself off rozerem to see if you really need everynight. Additional Follow-up by: Barbaraann Share MD,  January 15, 2010 2:39 PM    Additional Follow-up for Phone Call Additional follow up Details #2::    The patient had verbal understanding that Zolpidem would not be a good alternative for him and says he will continue on Rozerem for now. Follow-up by: Michel Bickers CMA,  January 15, 2010 2:55 PM

## 2010-10-19 NOTE — Miscellaneous (Signed)
Summary: Orders Update  Clinical Lists Changes  Orders: Added new Referral order of Diabetic Clinic Referral (Diabetic) - Signed 

## 2010-10-19 NOTE — Progress Notes (Signed)
Summary: switch rozerem to [place] for cost effectiveness?-  Phone Note Refill Request Call back at [facility] from:  Fax from Pharmacy on July 12, 2010 4:56 PM  received fax from pharmacy requesting a change from rozerem to zolpidem 5mg  or 10mg  with an estimated annual savings of $180; 90day supply.  last OV 11/27/09, next 11/26/10.  please advise if okay to switch.  KC not in the office until Friday.  will forward message to his inbox to address when he returns.  Initial call taken by: Boone Master CNA/MA,  July 12, 2010 4:58 PM  Follow-up for Phone Call        I would not recommend a change to a drug that has addicton potential and tolerance issues.  We had discussed in the past trying to come off rozeram...is he willing to try this? Follow-up by: Barbaraann Share MD,  July 13, 2010 1:17 PM  Additional Follow-up for Phone Call Additional follow up Details #1::        ATC pt LMTCB  Vernie Murders  July 13, 2010 1:39 PM  LMOMTCB  Vernie Murders  July 14, 2010 4:52 PM     Additional Follow-up for Phone Call Additional follow up Details #2::    Spoke with pt and advised that we had recieved this fax regarding switching from rozerem to zolpidem.  Pt states that he did not request this.  I advised him of KC's recs.  He states that he could try to come off of the rozerem, but that "now is not a good time for that"- due to stress.  He states that he is fine with the cost of med and that eventually he will try and come off of this.  Will forward to [facility] so that he is aware. Follow-up by: Vernie Murders,  July 14, 2010 5:10 PM  Additional Follow-up for Phone Call Additional follow up Details #3:: Details for Additional Follow-up Action Taken: noted. Additional Follow-up by: Barbaraann Share MD,  July 15, 2010 10:27 AM

## 2010-10-19 NOTE — Assessment & Plan Note (Signed)
Summary: ROV  Medications Added TRICOR 145 MG TABS (FENOFIBRATE) Take 1 tablet by mouth once a day CELEXA 20 MG TABS (CITALOPRAM HYDROBROMIDE) Take 1 tablet by mouth once a day        Visit Type:  Follow-up Primary Provider:  Swords  CC:  No cardiac complains.  History of Present Illness: His major complaint is that of fatigue.  Sugars are as high as 170-180mg .  Denies any chest pain associated with this.  He continues to have some shortness of breath with activity.  No major chest pain.  Denies progression of symptoms.  Main symptom is that of weakness and fatigue commonly.   Current Medications (verified): 1)  Onetouch Ultra Test  Strp (Glucose Blood) .... Once Daily As Needed 2)  Crestor 20 Mg  Tabs (Rosuvastatin Calcium) .... Take 1 Tablet By Mouth At Bedtime 3)  Metformin Hcl 1000 Mg  Tabs (Metformin Hcl) .... Take 1 Tablet By Mouth Two Times A Day 4)  Lisinopril 5 Mg  Tabs (Lisinopril) .... 1/2 Once Daily 5)  Rozerem 8 Mg  Tabs (Ramelteon) .... Take 1 Tab By Mouth At Bedtime 6)  Niaspan 1000 Mg  Tbcr (Niacin (Antihyperlipidemic)) .... Take 1 Tablet By Mouth Daily 7)  Flax Seed Oil 1000 Mg  Caps (Flaxseed (Linseed)) .... Two Times A Day 8)  Omega-3 350 Mg  Caps (Omega-3 Fatty Acids) .... Two Times A Day 9)  Centrum Silver   Tabs (Multiple Vitamins-Minerals) .... Once Daily 10)  Vitamin B-12 500 Mcg  Tabs (Cyanocobalamin) .... Once Daily 11)  Bd U/f Mini Pen Needle 31g X 5 Mm  Misc (Insulin Pen Needle) .... Use As Directed 12)  Aspirin Ec 325 Mg Tbec (Aspirin) .... Once Daily 13)  Fluticasone Propionate 50 Mcg/act  Susp (Fluticasone Propionate) .... 2 Sprays Each Nostril Once Daily 14)  Onetouch Ultrasoft Lancets  Misc (Lancets) .... Use As Directed Once Daily 15)  Tricor 145 Mg Tabs (Fenofibrate) .... Take 1 Tablet By Mouth Once A Day 16)  Celexa 20 Mg Tabs (Citalopram Hydrobromide) .... Take 1 Tablet By Mouth Once A Day  Allergies: 1)  ! Codeine Phosphate (Codeine  Phosphate) 2)  Penicillin G Potassium (Penicillin G Potassium) 3)  Oracea (Doxycycline (Rosacea))  Vital Signs:  Patient profile:   65 year old male Height:      70 inches Weight:      233.75 pounds BMI:     33.66 Pulse rate:   68 / minute Pulse rhythm:   regular Resp:     18 per minute BP sitting:   138 / 68  (right arm) Cuff size:   large  Vitals Entered By: Vikki Ports (December 08, 2009 11:24 AM)  Physical Exam  General:  Well developed, well nourished, in no acute distress. Head:  normocephalic and atraumatic Eyes:  PERRLA/EOM intact; conjunctiva and lids normal. Neck:  Supple Lungs:  Clear bilaterally to auscultation and percussion. Heart:  PMI non displaced.  Normal S1 and S2 with gallop or rubs.   Abdomen:  Bowel sounds positive; abdomen soft and non-tender without masses, organomegaly, or hernias noted. No hepatosplenomegaly. Extremities:  No clubbing or cyanosis. Neurologic:  Alert and oriented x 3.   EKG  Procedure date:  12/10/2009  Findings:      NSR.  WNL.    Impression & Recommendations:  Problem # 1:  CAD (ICD-414.00) Last GXT in September 2010, similar to before.  Went 7 minutes with no symptoms, and borderline changes which have been  noted in the past.  Overall status remains unchanged in the interim.  Weakness could be predominantly from hyperglycemia which remains with borderline control.  Will continue to follow as OP on regular basis.  Pt will see Dr. Cato Mulligan in next 24 hours for followup. His updated medication list for this problem includes:    Lisinopril 5 Mg Tabs (Lisinopril) .Marland Kitchen... 1/2 once daily    Aspirin Ec 325 Mg Tbec (Aspirin) ..... Once daily  Orders: EKG w/ Interpretation ([address])  Problem # 2:   HYPERTENSION, UNSPECIFIED (ICD-401.9) Appears controlled on current medical regimen. His updated medication list for this problem includes:    Lisinopril 5 Mg Tabs (Lisinopril) .Marland Kitchen... 1/2 once daily    Aspirin Ec 325 Mg Tbec (Aspirin) .....  Once daily  Orders: EKG w/ Interpretation ([address])  Problem # 3:  HYPERLIPIDEMIA (ICD-272.4) Managed by Dr. Cato Mulligan.  Getting lab work done in office with BS tomorrow.  Will review. His updated medication list for this problem includes:    Crestor 20 Mg Tabs (Rosuvastatin calcium) .Marland Kitchen... Take 1 tablet by mouth at bedtime    Niaspan 1000 Mg Tbcr (Niacin (antihyperlipidemic)) .Marland Kitchen... Take 1 tablet by mouth daily    Tricor 145 Mg Tabs (Fenofibrate) .Marland Kitchen... Take 1 tablet by mouth once a day  Orders: EKG w/ Interpretation ([address])  Patient Instructions: 1)  Your physician recommends that you continue on your current medications as directed. Please refer to the Current Medication list given to you today. 2)  Your physician wants you to follow-up in:   1 YEAR. You will receive a reminder letter in the mail two months in advance. If you don't receive a letter, please call our office to schedule the follow-up appointment.

## 2010-10-19 NOTE — Assessment & Plan Note (Signed)
Summary: FLU-SHOT/RCD  Nurse Visit   Allergies: 1)  ! Codeine Phosphate (Codeine Phosphate) 2)  Penicillin G Potassium (Penicillin G Potassium) 3)  Oracea (Doxycycline (Rosacea))  Orders Added: 1)  Flu Vaccine 19yrs + MEDICARE PATIENTS [Q2039] 2)  Administration Flu vaccine - MCR [G0008] Flu Vaccine Consent Questions     Do you have a history of severe allergic reactions to this vaccine? no    Any prior history of allergic reactions to egg and/or gelatin? no    Do you have a sensitivity to the preservative Thimersol? no    Do you have a past history of Guillan-Barre Syndrome? no    Do you currently have an acute febrile illness? no    Have you ever had a severe reaction to latex? no    Vaccine information given and explained to patient? yes    Are you currently pregnant? no    Lot Number:[id number]   Exp Date:03/19/2011   Site Given  Left Deltoid IM .[email or web address]

## 2010-10-19 NOTE — Letter (Signed)
Summary: [facility]  [facility]   Imported By: Lester Hatboro 05/05/2010 10:11:14  _____________________________________________________________________  External Attachment:    Type:   Image     Comment:   External Document

## 2010-10-19 NOTE — Progress Notes (Signed)
Summary: Xanax  Phone Note Call from Patient   Caller: Patient Call For: Birdie Sons MD Summary of Call: [facility]) [phone] Pt is having extreme anxiety due to family situations and would like a prescripton for Xanax. Initial call taken by: Lynann Beaver CMA,  May 10, 2010 10:08 AM  Follow-up for Phone Call        see rx call in Follow-up by: Birdie Sons MD,  May 10, 2010 1:21 PM    New/Updated Medications: ALPRAZOLAM 0.25 MG TABS (ALPRAZOLAM) Take 1 tablet by mouth once a day as needed anxiety Prescriptions: ALPRAZOLAM 0.25 MG TABS (ALPRAZOLAM) Take 1 tablet by mouth once a day as needed anxiety  #30 x 3   Entered and Authorized by:   Birdie Sons MD   Signed by:   Birdie Sons MD on 05/10/2010   Method used:   Telephoned to ...       [facility] [address] [phone]* (retail)       [address]       [place], [place]  [address]       Ph: [phone] or [phone]       Fax: [phone]   RxID:   [id number]  Pt notified.

## 2010-10-19 NOTE — Miscellaneous (Signed)
Summary: optimal pressure 17cm  Clinical Lists Changes  Orders: Added new Referral order of DME Referral (DME) - Signed auto shows great compliance, no leaks, and optimal pressure of 17cm

## 2010-10-19 NOTE — Letter (Signed)
Summary: Generic Letter  [facility]  [address] [place], [place] [address]   Phone: [phone]  Fax: [phone]    11/10/2009  ELEAZAR KIMMEY [address] [place], [place]  [address]   To Whom It May Concern,  Mr. Wetzel Meester is cleared to return to work Tuesday November 10, 2009.        Sincerely,     Birdie Sons MD

## 2010-10-19 NOTE — Assessment & Plan Note (Signed)
Summary: rov for osa   CC:  Pt is here for a 1 yr f/u appt.  Pt states he is wearing his cpap machine every night. Approx 8 hours per night.  Pt denied any complaints with mask.  Pt wonders if pressure may be too low.  Pt requests rx for Fluticasone spray. Brent [name]  History of Present Illness: The pt comes in today for f/u of his osa.  He is wearing cpap compliantly, and feels that he is sleeping well with adequate daytime alertness (though not as good as in past).  He denies any issues with mask fit, but wonders if his pressure may be too low.  He continues to have intermittant issues with nasal congestion and rhinorrhea.  Current Medications (verified): 1)  Onetouch Ultra Test  Strp (Glucose Blood) .... Once Daily As Needed 2)  Crestor 20 Mg  Tabs (Rosuvastatin Calcium) .... Take 1 Tablet By Mouth At Bedtime 3)  Metformin Hcl 1000 Mg  Tabs (Metformin Hcl) .... Take 1 Tablet By Mouth Two Times A Day 4)  Lisinopril 5 Mg  Tabs (Lisinopril) .... 1/2 Once Daily 5)  Rozerem 8 Mg  Tabs (Ramelteon) .... Take 1 Tab By Mouth At Bedtime 6)  Niaspan 1000 Mg  Tbcr (Niacin (Antihyperlipidemic)) .... Take 1 Tablet By Mouth Daily 7)  Flax Seed Oil 1000 Mg  Caps (Flaxseed (Linseed)) .... Two Times A Day 8)  Omega-3 350 Mg  Caps (Omega-3 Fatty Acids) .... Two Times A Day 9)  Centrum Silver   Tabs (Multiple Vitamins-Minerals) .... Once Daily 10)  Vitamin B-12 500 Mcg  Tabs (Cyanocobalamin) .... Once Daily 11)  Bd U/f Mini Pen Needle 31g X 5 Mm  Misc (Insulin Pen Needle) .... Use As Directed 12)  Aspirin Ec 325 Mg Tbec (Aspirin) .... Once Daily 13)  Fluticasone Propionate 50 Mcg/act  Susp (Fluticasone Propionate) .... 2 Sprays Each Nostril Once Daily 14)  Onetouch Ultrasoft Lancets  Misc (Lancets) .... Use As Directed Once Daily 15)  Fenofibrate 160 Mg Tabs (Fenofibrate) .... Take One Tablet Daily 16)  Citalopram Hydrobromide 40 Mg Tabs (Citalopram Hydrobromide) .... Take One Tablet By Mouth Daily  Allergies  (verified): 1)  ! Codeine Phosphate (Codeine Phosphate) 2)  Penicillin G Potassium (Penicillin G Potassium) 3)  Oracea (Doxycycline (Rosacea))  Review of Systems      See HPI  Vital Signs:  Patient profile:   65 year old male Height:      70 inches Weight:      239.38 pounds BMI:     34.47 O2 Sat:      96 % on Room air Temp:     98.1 degrees F oral Pulse (ortho):   72 / minute BP sitting:   138 / 72  (left arm) Cuff size:   regular  Vitals Entered By: Arman Filter LPN (November 27, 2009 10:04 AM)  O2 Flow:  Room air CC: Pt is here for a 1 yr f/u appt.  Pt states he is wearing his cpap machine every night. Approx 8 hours per night.  Pt denied any complaints with mask.  Pt wonders if pressure may be too low.  Pt requests rx for Fluticasone spray.  Comments Medications reviewed with patient Arman Filter LPN  November 27, 2009 10:04 AM    Physical Exam  General:  obese male in nad Nose:  no skin breakdown or pressure necrosis from cpap mask Neurologic:  alert and awake, doesn't appear sleepy moves all 4.   Impression &  Recommendations:  Problem # 1:  OBSTRUCTIVE SLEEP APNEA (ICD-327.23) the pt is doing fairly well with cpap, but feels his pressure may not be enough.  He feels that he also has mild increased symptoms of sleepiness.  His weight has not changed dramatically, and his machine is only 67yrs old.  Will do autotitration study at home for 2 weeks to re-optimize his pressure.  Have also stressed the need to work on weight loss.  Problem # 2:  ALLERGIC RHINITIS CAUSE UNSPECIFIED (ICD-477.9) I have asked the pt to get back on his nasal spray for awhile, and can also try otc chlorpheniramine.  Other Orders: Est. Patient Level III ([address]6440) Prescription Created Electronically [phone]) DME Referral (DME)  Patient Instructions: 1)  will re-optimize your pressure with an auto device at home for 2 weeks.  Will call with your pressure setting. 2)  can try chlorpheniramine 8mg   at bedtime (otc) for allergies as needed.  Can also use nasal spray as needed. 3)  work on weight loss. 4)  followup with me in one year.   Prescriptions: FLUTICASONE PROPIONATE 50 MCG/ACT  SUSP (FLUTICASONE PROPIONATE) 2 sprays each nostril once daily  #1 vial x 3   Entered and Authorized by:   Barbaraann Share MD   Signed by:   Barbaraann Share MD on 11/27/2009   Method used:   Electronically to        [facility] [address] [phone]* (retail)       [address]       [place], [place]  [address]       Ph: [phone] or [phone]       Fax: [phone]   RxID:   [id number]
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
## 2010-10-19 NOTE — Letter (Signed)
[facility]: [facility]  [facility]   Imported By: Maryln Gottron 01/15/2010 13:19:21  _____________________________________________________________________  External Attachment:    Type:   Image     Comment:   External Document

## 2010-10-19 NOTE — Miscellaneous (Signed)
Summary: Discharge Summary for PT Services/[facility]  Discharge Summary for PT Services/[facility]   Imported By: Maryln Gottron 10/01/2009 11:30:43  _____________________________________________________________________  External Attachment:    Type:   Image     Comment:   External Document

## 2010-10-19 NOTE — Progress Notes (Signed)
   Recieved paper from [facility] forwarded to [facility] for processing [facility]  November 12, 2009 8:39 AM

## 2010-10-19 NOTE — Letter (Signed)
Summary: [facility]  [employer] [facility]   Imported By: Sherian Rein 02/23/2010 10:33:04  _____________________________________________________________________  External Attachment:    Type:   Image     Comment:   External Document

## 2010-10-19 NOTE — Progress Notes (Signed)
Summary: REQ FOR 90-DAY Rx (Xanax)  Phone Note Refill Request   Refills Requested: Medication #1:  ALPRAZOLAM 0.25 MG TABS Take 1 tablet by mouth once a day as needed anxiety.   [facility] [address] Request [facility]-day Rx.    Initial call taken by: Debbra Riding,  July 01, 2010 4:05 PM     Appended Document: REQ FOR 90-DAY Rx (Xanax) telephoned in

## 2010-10-19 NOTE — Progress Notes (Signed)
Summary: rx request  Phone Note Call from Patient Call back at Home Phone [phone]   Caller: Patient Call For: clance Summary of Call: needs rx for a 90 days supply of FLUTICASONE (if not a 90 days supply ins won't pay for this). [facility] [address]  Initial call taken by: Tivis Ringer, CNA,  April 05, 2010 3:20 PM  Follow-up for Phone Call        called and spoke with pt and he is aware of 3 month supply with 3 refills---pt is to follow up in 1 year. Randell Loop CMA  April 05, 2010 3:29 PM     Prescriptions: FLUTICASONE PROPIONATE 50 MCG/ACT  SUSP (FLUTICASONE PROPIONATE) 2 sprays each nostril once daily  #3 x 3   Entered by:   Randell Loop CMA   Authorized by:   Barbaraann Share MD   Signed by:   Randell Loop CMA on 04/05/2010   Method used:   Electronically to        [facility] [address] [phone]* (retail)       [address]       [place], [place]  [address]       Ph: [phone] or [phone]       Fax: [phone]   RxID:   [id number]

## 2010-10-19 NOTE — Progress Notes (Signed)
Summary: Citalopram  Phone Note Refill Request Message from:  Fax from Pharmacy on Feb 16, 2010 1:07 PM  Refills Requested: Medication #1:  CELEXA 20 MG TABS Take 2 tablet by mouth once a day. It appears from the  last note that this patient needs an appt. which is not scheduled.  He is now requesting a 90 day supply of medication.  Please advise.  [facility] [address]   Method Requested: Electronic Initial call taken by: Delilah Shan CMA Duncan Dull),  Feb 16, 2010 1:08 PM  Follow-up for Phone Call        refill and make appt in 1-3 months Follow-up by: Birdie Sons MD,  Feb 16, 2010 3:06 PM    Prescriptions: CELEXA 20 MG TABS (CITALOPRAM HYDROBROMIDE) Take 2 tablet by mouth once a day  #60 x 2   Entered by:   Lynann Beaver CMA   Authorized by:   Birdie Sons MD   Signed by:   Lynann Beaver CMA on 02/16/2010   Method used:   Electronically to        [facility] [address] [phone]* (retail)       [address]       [place], [place]  [address]       Ph: [phone] or [phone]       Fax: [phone]   RxID:   [id number]
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
## 2010-11-03 ENCOUNTER — Encounter: Payer: Self-pay | Admitting: Pulmonary Disease

## 2010-11-16 NOTE — Letter (Signed)
Summary: CMN for CPAP Supplies/[facility]  CMN for CPAP Supplies/[facility]   Imported By: Sherian Rein 11/10/2010 12:17:54  _____________________________________________________________________  External Attachment:    Type:   Image     Comment:   External Document

## 2010-11-17 ENCOUNTER — Telehealth: Payer: Self-pay | Admitting: Internal Medicine

## 2010-11-17 NOTE — Telephone Encounter (Signed)
Triage vm-------has a dry cough foe several days and night. Sometimes it is dark green and would like something for cough and chest congestion to [facility]-----[place].

## 2010-11-18 NOTE — Telephone Encounter (Signed)
Mucinex DM one by mouth twice a day for 7 days.

## 2010-11-19 NOTE — Telephone Encounter (Signed)
Pt.notified

## 2010-11-26 ENCOUNTER — Encounter: Payer: Self-pay | Admitting: Pulmonary Disease

## 2010-11-26 ENCOUNTER — Ambulatory Visit (INDEPENDENT_AMBULATORY_CARE_PROVIDER_SITE_OTHER): Payer: Medicare Other | Admitting: Pulmonary Disease

## 2010-11-26 DIAGNOSIS — G4700 Insomnia, unspecified: Secondary | ICD-10-CM

## 2010-11-26 DIAGNOSIS — G4733 Obstructive sleep apnea (adult) (pediatric): Secondary | ICD-10-CM

## 2010-11-30 NOTE — Assessment & Plan Note (Signed)
Summary: rov for osa    Primary Provider/Referring Provider:  Swords  CC:  1 year f/u appt for OSA. Pt states he wears his cpap machine every night.  Approx 8 hours per night.  Pt states he recently got a new mask and denies any complaints with it. Marland Kitchen  History of Present Illness: the pt comes in today for f/u of his osa.  He is wearing cpap compliantly, and has no issue with cpap mask or pressure.  He is sleeping well, and reports no alertness issues during the day.  He is taking rozerem as needed for his sleep onset issues, and is working well.   Medications Prior to Update: 1)  Onetouch Ultra Test  Strp (Glucose Blood) .... Once Daily As Needed 2)  Crestor 20 Mg  Tabs (Rosuvastatin Calcium) .... Take 1 Tablet By Mouth At Bedtime 3)  Metformin Hcl 1000 Mg  Tabs (Metformin Hcl) .... Take 1 Tablet By Mouth Two Times A Day 4)  Lisinopril 5 Mg  Tabs (Lisinopril) .... Take 1 Tablet By Mouth Once A Day 5)  Rozerem 8 Mg  Tabs (Ramelteon) .... Take 1 Tab By Mouth At Bedtime 6)  Niaspan 1000 Mg  Tbcr (Niacin (Antihyperlipidemic)) .... Take 1 Tablet By Mouth Daily 7)  Flax Seed Oil 1000 Mg  Caps (Flaxseed (Linseed)) .... Two Times A Day 8)  Omega-3 350 Mg  Caps (Omega-3 Fatty Acids) .... Two Times A Day 9)  Centrum Silver   Tabs (Multiple Vitamins-Minerals) .... Once Daily 10)  Bd U/f Mini Pen Needle 31g X 5 Mm  Misc (Insulin Pen Needle) .... Use As Directed 11)  Aspirin Ec 325 Mg Tbec (Aspirin) .... Once Daily 12)  Fluticasone Propionate 50 Mcg/act  Susp (Fluticasone Propionate) .... 2 Sprays Each Nostril Once Daily 13)  Onetouch Ultrasoft Lancets  Misc (Lancets) .... Use As Directed Once Daily 14)  Tricor 145 Mg Tabs (Fenofibrate) .... Take 1 Tablet By Mouth Once A Day 15)  Celexa 20 Mg Tabs (Citalopram Hydrobromide) .... Take 1  Tablet By Mouth Once A Day 16)  Vitamin B-12 1000 Mcg Tabs (Cyanocobalamin) .... Take One Tab By Mouth Once Daily 17)  Alprazolam 0.25 Mg Tabs (Alprazolam) .... Take 1  Tablet By Mouth Once A Day As Needed Anxiety  Allergies (verified): 1)  ! Codeine Phosphate (Codeine Phosphate) 2)  Penicillin G Potassium (Penicillin G Potassium) 3)  Oracea (Doxycycline (Rosacea))  Vital Signs:  Patient profile:   65 year old male Height:      70 inches Weight:      232 pounds BMI:     33.41 O2 Sat:      96 % on Room air Temp:     98.1 degrees F oral Pulse rate:   74 / minute BP sitting:   128 / 72  (right arm) Cuff size:   regular  Vitals Entered By: Arman Filter LPN (November 25, 1608 10:06 AM)  O2 Flow:  Room air CC: 1 year f/u appt for OSA. Pt states he wears his cpap machine every night.  Approx 8 hours per night.  Pt states he recently got a new mask and denies any complaints with it.  Comments Medications reviewed with patient Arman Filter LPN  November 26, 9602 10:06 AM    Physical Exam  General:  ow male in nad  Nose:  no skin breakdown or pressure necrosis from cpap mask  Extremities:  no edema or cyanosis  Neurologic:  alert, not  sleepy, moves all 4    Impression & Recommendations:  Problem # 1:  OBSTRUCTIVE SLEEP APNEA (ICD-327.23)  the pt is doing well on cpap, and is satisfied with his sleep and daytime alertness.  He has lost weight since the last visit, and I have asked him to continue with this.  Will see him back in one year, or sooner if having issues.   Problem # 2:  PERSISTENT DISORDER INITIATING/MAINTAINING SLEEP (ICD-307.42)  doing well with rozerem   Other Orders: Est. Patient Level III ([address])  Patient Instructions: 1)  continue with cpap  2)  work on weight loss 3)  followup with me in one year.

## 2010-12-09 ENCOUNTER — Other Ambulatory Visit: Payer: Self-pay | Admitting: Pulmonary Disease

## 2010-12-13 ENCOUNTER — Other Ambulatory Visit: Payer: Self-pay | Admitting: *Deleted

## 2010-12-13 DIAGNOSIS — F341 Dysthymic disorder: Secondary | ICD-10-CM

## 2010-12-13 MED ORDER — ALPRAZOLAM 0.25 MG PO TABS: 0.2500 mg | ORAL_TABLET | Freq: Three times a day (TID) | ORAL | Status: DC | PRN

## 2010-12-15 ENCOUNTER — Telehealth: Payer: Self-pay | Admitting: Pulmonary Disease

## 2010-12-15 MED ORDER — RAMELTEON 8 MG PO TABS: 8.0000 mg | ORAL_TABLET | Freq: Every day | ORAL | Status: DC

## 2010-12-15 NOTE — Telephone Encounter (Signed)
Unsure why this med was denied.  Pt was recently seen by [facility] on 11-26-2010 and was told to f/u in 1 year.  rx sent to pharmacy.  Pt aware.

## 2010-12-16 ENCOUNTER — Ambulatory Visit: Payer: Self-pay | Admitting: Cardiology

## 2011-01-01 ENCOUNTER — Encounter: Payer: Self-pay | Admitting: Cardiology

## 2011-01-03 ENCOUNTER — Other Ambulatory Visit (HOSPITAL_COMMUNITY): Payer: Self-pay | Admitting: Orthopedic Surgery

## 2011-01-03 ENCOUNTER — Encounter (HOSPITAL_COMMUNITY)
Admission: RE | Admit: 2011-01-03 | Discharge: 2011-01-03 | Disposition: A | Discharge status: Home / Self Care | Payer: Medicare Other | Source: Ambulatory Visit | Attending: Orthopedic Surgery | Admitting: Orthopedic Surgery

## 2011-01-03 DIAGNOSIS — M7540 Impingement syndrome of unspecified shoulder: Secondary | ICD-10-CM

## 2011-01-03 LAB — URINALYSIS, ROUTINE W REFLEX MICROSCOPIC
Bilirubin Urine: NEGATIVE
Ketones, ur: NEGATIVE mg/dL
Nitrite: NEGATIVE
Protein, ur: NEGATIVE mg/dL
Specific Gravity, Urine: 1.026 (ref 1.005–1.030)
Urobilinogen, UA: 1 mg/dL (ref 0.0–1.0)

## 2011-01-03 LAB — COMPREHENSIVE METABOLIC PANEL
Alkaline Phosphatase: 112 U/L (ref 39–117)
BUN: 12 mg/dL (ref 6–23)
CO2: 27 mEq/L (ref 19–32)
Chloride: 104 mEq/L (ref 96–112)
Creatinine, Ser: 0.94 mg/dL (ref 0.4–1.5)
GFR calc non Af Amer: >60 mL/min (ref >60)
Glucose, Bld: 275 mg/dL — ABNORMAL HIGH (ref 70–99)
Potassium: 4.5 mEq/L (ref 3.5–5.1)
Total Bilirubin: 0.5 mg/dL (ref 0.3–1.2)

## 2011-01-03 LAB — CBC
HCT: 41.8 % (ref 39.0–52.0)
MCHC: 34.4 g/dL (ref 30.0–36.0)
RDW: 12.4 % (ref 11.5–15.5)

## 2011-01-03 LAB — URINE MICROSCOPIC-ADD ON

## 2011-01-03 LAB — PROTIME-INR: INR: 0.91 (ref 0.00–1.49)

## 2011-01-03 LAB — SURGICAL PCR SCREEN: MRSA, PCR: NEGATIVE

## 2011-01-04 ENCOUNTER — Encounter: Payer: Self-pay | Admitting: Cardiology

## 2011-01-04 ENCOUNTER — Ambulatory Visit (INDEPENDENT_AMBULATORY_CARE_PROVIDER_SITE_OTHER): Payer: Medicare Other | Admitting: Cardiology

## 2011-01-04 VITALS — BP 138/82 | HR 78 | Ht 72.0 in | Wt 228.0 lb

## 2011-01-04 DIAGNOSIS — I10 Essential (primary) hypertension: Secondary | ICD-10-CM

## 2011-01-04 DIAGNOSIS — E785 Hyperlipidemia, unspecified: Secondary | ICD-10-CM

## 2011-01-04 DIAGNOSIS — I2510 Atherosclerotic heart disease of native coronary artery without angina pectoris: Secondary | ICD-10-CM

## 2011-01-04 NOTE — Patient Instructions (Signed)
Your physician recommends that you continue on your current medications as directed. Please refer to the Current Medication list given to you today.  Your physician wants you to follow-up in: 6 MONTHS. You will receive a reminder letter in the mail two months in advance. If you don't receive a letter, please call our office to schedule the follow-up appointment.  

## 2011-01-05 ENCOUNTER — Telehealth: Payer: Self-pay | Admitting: Cardiology

## 2011-01-05 NOTE — Telephone Encounter (Signed)
LOV,12 faxed to [facility] @ [phone] 01/05/11/KM

## 2011-01-05 NOTE — Progress Notes (Signed)
HPI:  [name] comes in for follow up.  He is doing pretty well cardiac wise.  He denies any chest pain.  We had a long discussion today regarding surgical risks.  He is able to get out and walk two miles without chest tightness, shortness of breath, or other major symptoms.  We also reviewed his need for surgery.  He clearly needs something to be done.    Current Outpatient Prescriptions  Medication Sig Dispense Refill  . ALPRAZolam (XANAX) 0.25 MG tablet Take 0.25 mg by mouth at bedtime as needed.        . citalopram (CELEXA) 20 MG tablet Take 20 mg by mouth daily.        . fenofibrate (TRICOR) 145 MG tablet Take 145 mg by mouth daily.        Marland Kitchen FLAXSEED, LINSEED, PO 1 tab po qd       . fluticasone (FLONASE) 50 MCG/ACT nasal spray Spray twice up each nostril as needed      . lisinopril (PRINIVIL,ZESTRIL) 5 MG tablet Take 5 mg by mouth daily.        . metformin (FORTAMET) 1000 MG (OSM) 24 hr tablet Take 1,000 mg by mouth 2 (two) times daily with a meal.       . Multiple Vitamins-Minerals (CENTRUM SILVER PO) 1 tab po qd       . niacin (NIASPAN) 1000 MG CR tablet Take 1,000 mg by mouth at bedtime.        . ramelteon (ROZEREM) 8 MG tablet Take 1 tablet (8 mg total) by mouth at bedtime.  90 tablet  3  . rosuvastatin (CRESTOR) 20 MG tablet Take 20 mg by mouth daily.        . vitamin B-12 (CYANOCOBALAMIN) 1000 MCG tablet Take 1,000 mcg by mouth daily.        Marland Kitchen DISCONTD: ALPRAZolam (XANAX) 0.25 MG tablet Take 1 tablet (0.25 mg total) by mouth 3 (three) times daily as needed for anxiety.  90 tablet  0  . sertraline (ZOLOFT) 25 MG tablet Take 25 mg by mouth daily. Begin after finishing celexa         Allergies  Allergen Reactions  . Codeine Phosphate     REACTION: nausea and vomiting  . Penicillins     REACTION: unspecified    Past Medical History  Diagnosis Date  . CAD (coronary artery disease)   . Myocardial infarction   . HTN (hypertension)   . Hyperlipidemia   . Obesity   . Diabetes  mellitus   . Fatigue   . OSA (obstructive sleep apnea)   . Persistent disorder of initiating or maintaining sleep   . Erectile dysfunction   . Nephrolithiasis   . Diverticulosis     Past Surgical History  Procedure Date  . Coronary stent placement     stenting of the right coronary artery with followup rotational  atherectomy.   . Cholecystectomy   . Colonoscopy   . Coronary angioplasty   . Orthopedic surgery     Family History  Problem Relation Age of Onset  . Heart disease      History   Social History  . Marital Status: Married    Spouse Name: N/A    Number of Children: N/A  . Years of Education: N/A   Occupational History  . Not on file.   Social History Main Topics  . Smoking status: Former Games developer  . Smokeless tobacco: Not on file  . Alcohol Use:  Not on file  . Drug Use: No  . Sexually Active: Not on file   Other Topics Concern  . Not on file   Social History Narrative  . No narrative on file    ROS: Please see the HPI.  All other systems reviewed and negative.  PHYSICAL EXAM:  BP 138/82  Pulse 78  Ht 6' (1.829 m)  Wt 228 lb (103.42 kg)  BMI 30.92 kg/m2  General: Well developed, well nourished, in no acute distress. Head:  Normocephalic and atraumatic. Neck: no JVD Lungs: Clear to auscultation and percussion. Heart: Normal S1 and S2.  No murmur, rubs or gallops.  Abdomen:  Normal bowel sounds; soft; non tender; no organomegaly Pulses: Pulses normal in all 4 extremities. Extremities: No clubbing or cyanosis. No edema. Neurologic: Alert and oriented x 3.  EKG: NSR.  WNL.    ASSESSMENT AND PLAN:

## 2011-01-05 NOTE — Assessment & Plan Note (Signed)
He currently has no active symptoms.  He last underwent cath in 2003, the results of which are in [facility] for reference.  He does have prior PCI involving a lengthy segment of RCA, and known LAD disease.  No active symptoms.  GXT done in the fall of 2010.  He went over seven minutes without chest pain.  There was borderline ST change, as expected, unchanged from 2008.  There has been no clinical change from the last testing.  He remains active.  As I pointed out, there is perhaps some increase in risk, but further testing at this point in that he is asymptomatic, with GXT done one and a half years ago without interval change would not change approach.  He believes surgery is needed, and wants to proceed.  He should remain on medical therapy.  He far exceeds the four met level with his exercise.  We will be available should any problems develop.

## 2011-01-05 NOTE — Assessment & Plan Note (Signed)
Has been treated aggressively over the years.  Under the management of Dr. Cato Mulligan.  Has also been seen in lipid clinic.  Should have annual numbers done with three drug treatment.

## 2011-01-05 NOTE — Assessment & Plan Note (Signed)
Currently under control.  Also had DM.  Managed as noted by Dr. Cato Mulligan.

## 2011-01-06 ENCOUNTER — Ambulatory Visit (HOSPITAL_COMMUNITY)
Admission: RE | Admit: 2011-01-06 | Discharge: 2011-01-06 | Disposition: A | Discharge status: Home / Self Care | Payer: Medicare Other | Source: Ambulatory Visit | Attending: Orthopedic Surgery | Admitting: Orthopedic Surgery

## 2011-01-06 DIAGNOSIS — I2510 Atherosclerotic heart disease of native coronary artery without angina pectoris: Secondary | ICD-10-CM | POA: Insufficient documentation

## 2011-01-06 DIAGNOSIS — E119 Type 2 diabetes mellitus without complications: Secondary | ICD-10-CM | POA: Insufficient documentation

## 2011-01-06 DIAGNOSIS — M25819 Other specified joint disorders, unspecified shoulder: Secondary | ICD-10-CM | POA: Insufficient documentation

## 2011-01-06 DIAGNOSIS — I10 Essential (primary) hypertension: Secondary | ICD-10-CM | POA: Insufficient documentation

## 2011-01-06 DIAGNOSIS — E669 Obesity, unspecified: Secondary | ICD-10-CM | POA: Insufficient documentation

## 2011-01-06 DIAGNOSIS — Z01818 Encounter for other preprocedural examination: Secondary | ICD-10-CM | POA: Insufficient documentation

## 2011-01-06 DIAGNOSIS — M67919 Unspecified disorder of synovium and tendon, unspecified shoulder: Secondary | ICD-10-CM | POA: Insufficient documentation

## 2011-01-06 DIAGNOSIS — M719 Bursopathy, unspecified: Secondary | ICD-10-CM | POA: Insufficient documentation

## 2011-01-06 DIAGNOSIS — I252 Old myocardial infarction: Secondary | ICD-10-CM | POA: Insufficient documentation

## 2011-01-06 DIAGNOSIS — M19019 Primary osteoarthritis, unspecified shoulder: Secondary | ICD-10-CM | POA: Insufficient documentation

## 2011-01-06 DIAGNOSIS — Z01812 Encounter for preprocedural laboratory examination: Secondary | ICD-10-CM | POA: Insufficient documentation

## 2011-01-06 DIAGNOSIS — G4733 Obstructive sleep apnea (adult) (pediatric): Secondary | ICD-10-CM | POA: Insufficient documentation

## 2011-01-06 LAB — GLUCOSE, CAPILLARY: Glucose-Capillary: 175 mg/dL — ABNORMAL HIGH (ref 70–99)

## 2011-01-11 NOTE — Op Note (Signed)
[name], [name] NO.:  [id number]  MEDICAL RECORD NO.:  [id number]           PATIENT TYPE:  O  LOCATION:  [facility]                         FACILITY:  [facility]  PHYSICIAN:  Vania Rea. Hampton Wixom, M.D.  DATE OF BIRTH:  December 02, 1945  DATE OF PROCEDURE:  01/06/2011 DATE OF DISCHARGE:  01/06/2011                              OPERATIVE REPORT   PREOPERATIVE DIAGNOSES: 1. Chronic left shoulder impingement syndrome. 2. Left shoulder symptomatic acromioclavicular joint arthrosis. 3. Possible full-thickness rotator cuff tear.  POSTOPERATIVE DIAGNOSES: 1. Chronic left shoulder impingement syndrome. 2. Left shoulder acromioclavicular joint arthrosis. 3. Left shoulder full-thickness rotator cuff tear. 4. Left shoulder complex extensive labral tear.  PROCEDURES: 1. Left shoulder examination under anesthesia. 2. Left shoulder glenohumeral joint diagnostic arthroscopy. 3. Debridement of complex and extensive degenerative labral tear  including a superior as well as posterior element. 4. Arthroscopic subacromial decompression and bursectomy. 5. Arthroscopic distal clavicle resection. 6. Arthroscopic rotator cuff repair using a double row suture bridge     repair construct.  SURGEON:  Vania Rea. Lakedra Washington, MD  ASSISTANT:  Ralene Bathe, PA-C  ANESTHESIA:  General endotracheal as well as an interscalene block  ESTIMATED BLOOD LOSS:  Minimal.  DRAINS:  None.  HISTORY:  Brent [name] is a 65 year old gentleman who has had persistent and progressive increasing left shoulder pain as well as functional limitations with symptoms refractory to prolonged attempts at conservative management.  Plain radiographs showed advanced AC joint arthrosis with an MRI scan showing an apparently significant partial not small full-thickness rotator cuff tear.  Due to his ongoing pain and functional limitation, he is brought to the operating room at this time for planned left shoulder arthroscopy as  described below.  Preoperatively, we counseled Brent [name] on treatment options as well as risks versus benefits thereof.  Possible surgical complications were reviewed with potential for bleeding, infection, neurovascular injury, persistent pain, loss of motion, anesthetic complication, and possible need for additional surgery.  He understands and accepts and agrees with our planned procedure.  PROCEDURE IN DETAIL:  After undergoing routine preop evaluation, the patient received prophylactic antibiotics and an interscalene block was established in the holding area by the Anesthesia Department.  Placed supine on the table, underwent smooth induction of general endotracheal anesthesia.  Turned to the right lateral decubitus position on a beanbag and appropriately padded and protected.  Left shoulder examination under anesthesia revealed full motion with no instability patterns.  Left arm suspended at 70 degrees of abduction with 15 pounds of traction.  The left shoulder girdle region was sterilely prepped and draped in standard fashion.  A time-out was called.  Posterior portal was established in the glenohumeral joint and anterior portal established under direct visualization.  The glenohumeral articular surfaces were in good condition.  There was some degenerative tearing of the anterior as well as superior aspects of the labrum which was debrided with a shaver. Posteriorly, there was a significant complex and extensive labral tear and this was debrided with a shaver to stable margin.  There were no instability patterns noted.  The rotator cuff showed some fraying on the  articular side but did not visualize any obvious full-thickness tears when looking from the articular side.  At this point, further instruments were then removed.  I should mention the biceps anchor was stable and biceps tendon was normal in caliber.  The arm was then dropped down to 30 degrees of abduction with the  arthroscope introduced in the subacromial space of the posterior portal and a direct lateral portal established in the subacromial space.  Abundant dense prolific bursal tissue was encountered and this was excised with a combination of a shaver and a Stryker wand.  Wand was then used to repair the periosteum from the undersurface of the anterior half of the acromion. Then the subacromial decompression was performed with a bur creating a type 1 morphology.  In addition, we found a small accessory ossicle anterior to the acromion which we excised with a combination of a shaver and a bur.  A portal was established directly anterior to the distal clavicle and the distal clavicle resection was performed with a bur. Care was taken to ensure the entire circumference of the distal clavicle could be visualized to ensure adequate removal of bone.  We then completed the subacromial/subdeltoid bursectomy.  The bursal surface of rotator cuff showed an obvious defect involving the distal supraspinatus and this was debrided back to stable margin.  We found a essentially full-thickness defect with just a very small intact articular-sided portion of the rotator cuff representing less than 5-10% of the thickness of the tendons.  We went ahead and completed the rotator cuff tear and debrided all back to healthy tissue.  We then prepared the greater tuberosity and gently abraded the bone.  There was approximately 2.5 cm width.  I established an accessory portal device tear and then through the stab wound on the lateral margin of the acromion we placed an Arthrex PEEK corkscrew suture anchor.  Limbs of the suture anchor were then passed to the adjacent margin of the rotator cuff in a horizontal mattress pattern.  These were then tied with sliding locking knots followed by multiple overhand throws and alternating posts.  We then created a "suture bridge" to PushLock suture anchors which nicely reinforced the  repair and compressed the free margin of the rotator cuff against bony bed of the tuberosity.  Suture limbs were clipped.  The overall appearance was much to our satisfaction.  Final irrigation was completed.  Fluid and instruments were removed.  The ports were closed with Monocryl and Steri-Strips with bulky dry dressing taped to the left shoulder and left arm was placed in a sling immobilizer.  The patient was awakened, extubated, and taken to the recovery room in stable condition.     Vania Rea. Ghislaine Harcum, M.D.     KMS/MEDQ  D:  01/06/2011  T:  01/06/2011  Job:  [id number]  Electronically Signed by Francena Hanly M.D. on 01/11/2011 06:24:59 PM

## 2011-02-01 NOTE — Letter (Signed)
August 20, 2007    [name], MD  [address] [place], [place] [address]   RE:  [name], BATCH  MRN:  [id number]  /  DOB:  [birth year]   Dear Smitty Cords:   I had the pleasure of seeing Brent [name] in the office today in  followup.  Other than fatigue, he seems to be getting along well.  He  stopped exercising because of some discomfort in the balls of his feet  when he went on the treadmill.  Otherwise, he has gotten along nicely.   PHYSICAL EXAMINATION:  VITAL SIGNS:  Blood pressure 141/82, pulse 73.  LUNGS:  The lung fields are clear.  CARDIAC:  The rhythm is regular.  EXTREMITIES:  No edema.   His electrocardiogram is entirely within normal limits.   Overall, Brent [name] is doing relatively well.  I have encouraged him to  exercise on a regular basis.  He had a treadmill done earlier in the  year, and this was nonischemic, so I feel comfortable pushing him in  this direction.  He has also had some erectile dysfunction, and he sees  Darvin Neighbours.  I have encouraged him to follow up with Ron.  As long as he  is not using  nitrates or having unstable symptoms, he should be safe for use of a  phosphodiesterase inhibitor.   Thanks for allowing me to share in his care.  He will call me if  anything develops, and I will see him back in 6 months otherwise.    Sincerely,      Arturo Morton. Riley Kill, MD, [facility]  Electronically Signed    TDS/MedQ  DD: 08/20/2007  DT: 08/20/2007  Job #: [id number]

## 2011-02-01 NOTE — Assessment & Plan Note (Signed)
[facility]                            CARDIOLOGY OFFICE NOTE   NAME:Brent [name]                      MRN:          [id number]  DATE:03/27/2008                            DOB:          Nov 29, 1945    [name] is in for followup.  He is stable.  He has not been having any  ongoing chest pain or shortness of breath.  He actually feels quite  well.  He did present to the emergency room because of hives, received  prednisone, and his sugars have been somewhat more difficult to control  since then.   He does not think that depression has been a problem.   PHYSICAL EXAMINATION:  VITALS:  Blood pressure is 130/80, pulse is 66,  weight is down from 231 to 225.  LUNG:  Fields are clear to auscultation and percussion.  CARDIAC:  Rhythm is regular.  There is no significant murmur noted.   Electrocardiogram demonstrates normal sinus rhythm essentially within  normal limits.   IMPRESSION:  1. Coronary artery disease with preserved left ventricular function      and scattered three-vessel disease, medically managed with prior      stenting of the right coronary artery with followup rotational      atherectomy.  2. Hypercholesterolemia.  3. Diabetes mellitus.  4. Hypertension.   PLAN:  1. Lipid and liver profile.  2. Return to clinic in 6 months.      Arturo Morton. Riley Kill, MD, [facility]  Electronically Signed     Arturo Morton. Riley Kill, MD, [facility]  Electronically Signed   TDS/MedQ  DD: 03/27/2008  DT: 03/27/2008  Job #: [id number]   cc:   Valetta Mole. Swords, MD

## 2011-02-01 NOTE — Assessment & Plan Note (Signed)
Brent [name]                            CARDIOLOGY OFFICE NOTE   NAME:Brent [name], Brent [name]                      MRN:          [id number]  DATE:10/20/2008                            DOB:          [birth year]    Brent [name] is in for followup.  Cardiac-wise, he is doing well.  He is  being followed closely by Dr. Birdie Sons.  His lipids are intact.  His  hemoglobin A1c is up slightly.  He has had no chest pain.  He continues  to get along well without any recurrent symptoms.   MEDICATIONS:  1. Metformin 1000 mg b.i.d.  2. Lisinopril 5 mg one half daily.  3. TriCor 145 mg daily.  4. Crestor 20 mg daily.  5. Celexa 20 mg daily.  6. Niaspan 1000 mg daily.  7. Aspirin 325 mg daily.  8. Centrum Silver daily.  9. Omega 3 b.i.d.  10.Flaxseed oil 1000 b.i.d.  11.Rozerem 8 mg nightly.  12.B12 1000 mg daily.  13.Lantus 48 units.   PHYSICAL EXAMINATION:  VITAL SIGNS:  The blood pressure is 128/73, the  pulse is 71.  LUNGS:  Fields are clear.  NECK:  The upstrokes are brisk.  CARDIAC:  Rhythm is regular without significant murmur.  EXTREMITIES:  No edema.   EKG; normal sinus rhythm essentially within normal limits.   LABORATORY DATA:  Recent laboratory studies with Dr. Birdie Sons  revealed normal liver functions.  A hemoglobin A1c is 7.5.  Cholesterol  is 138, triglycerides 190, HDL 31, and LDL 69.  I discussed with him the  fact that his triglycerides may be elevated and the weight loss might be  helpful in this regard.   IMPRESSION:  1. Coronary artery disease, status post multiple percutaneous coronary      interventions.  2. Hypercholesterolemia.  3. Insulin-dependent diabetes mellitus.   RECOMMENDATIONS:  1. Continue current medical regimen.  2. Return to clinic for a 39-month treadmill.     Brent [name]. Riley Kill, MD, [facility]  Electronically Signed    TDS/MedQ  DD: 10/20/2008  DT: 10/21/2008  Job #: [id number]

## 2011-02-01 NOTE — Assessment & Plan Note (Signed)
[facility]                            CARDIOLOGY OFFICE NOTE   NAME:Brent [name], Brent [name]                      MRN:          [id number]  DATE:04/24/2007                            DOB:          1946/03/27    Brent [name] is in for a follow-up visit.  He denies any chest pain.  He  denies any significant shortness of breath.  Unfortunately he has had a  fair amount of fatigue.  He does not use any of his exercise machines  and he says the treadmill actually makes him a little bit worse.  He has  also had an exercise bicycle.  We have talked about various forms of  exercise.  He has been playing golf, but he says he is fairly limited.   MEDICAL DECISION MAKING:  1. Metformin 1000 mg p.o. twice daily.  2. Lisinopril 5 mg, 1/2 daily.  3. Tri-Chlor 145 mg daily.  4. Crestor 20 mg daily.  5. Celexa 20 mg daily.  6. Niaspan 1000 mg daily.  7. Aspirin 325 mg daily.  8. Centrum Silver daily.  9. B12 daily.  10.Omega-3 daily.  11.Flax seed oil daily.  12.Rozerem 8 mg q.h.s.   PHYSICAL EXAMINATION:  VITAL SIGNS:  Blood pressure 130/70, pulse 70.  LUNGS:  Fields are clear.  HEART:  Rhythm is regular.  There is not a progressive murmur noted.   DISCUSSION:  Brent [name] has been consistently stable.  His lipid profile  and basic metabolic profile are okay.  His LDL is 69 on a triple drug  therapy.  His glucose is elevated somewhat.  We have talked about  various strategies here.  He thinks the treatment of sleep apnea has  helped.  I would like to get him to exercise more.  If his symptoms  persist, then we might need to look at this with regard to a cardiac  catheterization.  His electrocardiogram today shows no acute changes.   FOLLOWUP:  We will see him in followup in six months, but sooner if he  does not improve.     Arturo Morton. Riley Kill, MD, [facility]  Electronically Signed    TDS/MedQ  DD: 04/24/2007  DT: 04/25/2007  Job #: [id number]

## 2011-02-04 NOTE — Assessment & Plan Note (Signed)
[facility]                              CARDIOLOGY OFFICE NOTE   NAME:Vantol, FOYE DAMRON                      MRN:          [id number]  DATE:04/11/2006                            DOB:          1946-03-22    Mr. Glennon is in for a followup visit.  To briefly summarize, the patient is  stable, and he has not been having any ongoing chest pain.  He has switched  to a form of generic Statin, and he thinks it is Crestor.  He does not have  specific chest pain.  His most recent laboratory studies include a  cholesterol of 145, HDL of 29, LDL of 80, and this includes therapy on  Tricor 145 mg daily, Crestor 20 mg, and Niaspan 1000 mg nightly.   PHYSICAL EXAMINATION:  He is an alert and oriented gentleman, in no acute  distress.  BLOOD PRESSURE:  134/78.  PULSE:  66.  No carotid bruits.  LUNGS:  Fields clear.  PMI:  Non-displaced, normal first and second heart sound.  EXTREMITIES:  No edema.   Electrocardiogram demonstrates normal sinus rhythm, essentially within  normal limits.   On recent lipid profile, BUN is 16, creatinine 1.   IMPRESSION:  1.  Multivessel coronary artery disease.  2.  Hypercholesterolemia on lipid-lowering therapy.   DISPOSITION:  1.  Continue current medical regimen.  2.  Return to clinic in 6 months.  3.  Patient will call us with regard to the medicine he is actually      receiving from the [place] system.                              Arturo Morton. Riley Kill, MD, [facility]    TDS/MedQ  DD:  04/11/2006  DT:  04/11/2006  Job #:  [id number]

## 2011-02-04 NOTE — Assessment & Plan Note (Signed)
[facility]                                 ON-CALL NOTE   NAME:[name], Brent OUTTEN                 MRN:          [id number]  DATE:10/14/2006                            DOB:          19-Oct-1945    [phone].  Patient of Dr. Cato Mulligan.   The patient calling because of severe abdominal pain.  Advised to go to  the emergency room for eval.     Tinnie Gens A. Tawanna Cooler, MD  Electronically Signed    JAT/MedQ  DD: 10/17/2006  DT: 10/17/2006  Job #: [id number]

## 2011-02-04 NOTE — Op Note (Signed)
Brent [name], Brent [name]               ACCOUNT NO.:  [id number]   MEDICAL RECORD NO.:  [id number]          PATIENT TYPE:  AMB   LOCATION:  DSC                          FACILITY:  [facility]   PHYSICIAN:  Currie Paris, M.D.DATE OF BIRTH:  19-Aug-1946   DATE OF PROCEDURE:  10/12/2005  DATE OF DISCHARGE:                                 OPERATIVE REPORT   CCS [id number]   PREOP DIAGNOSIS:  Left inguinal hernia.   POSTOPERATIVE DIAGNOSIS:  Left inguinal hernia--direct.   OPERATION:  Repair of left inguinal hernia with mesh (plug and patch).   SURGEON:  Dr. Jamey Ripa.   ANESTHESIA:  General.   CLINICAL HISTORY:  Mr. Ong is a 65 year old gentleman who had a somewhat  symptomatic left inguinal hernia that he desired to have repaired.   DESCRIPTION OF PROCEDURE:  The patient was seen in the holding area. We  identified the left inguinal area as the operative site and it was marked.  The patient had no further questions regarding the surgery.   He was taken to operating room and after satisfactory general anesthesia had  been obtained, the left inguinal area was clipped, prepped and draped. The  time-out occurred.   I used 0.25% plain Marcaine to see if we could help with postoperative pain  relief. The incisional area was anesthetized as was an area below the fascia  near the anterior superior iliac spine. Incision was made in an oblique  fashion and deep to the external oblique with additional local infiltrate as  deeper layers were encountered. The external oblique was opened in line of  its fibers and elevated off the underlying structures. The cord was  dissected up off the inguinal floor and a direct hernia with fat protruding  through was found and stuck to the back surface of the cord. This was peeled  off and reduced. The defect itself was about 2 cm but there is a large  amount of tissue bulging through.   I closely inspected the cord. There was no indirect sac present.   I then  opened the attenuated transversalis across the direct defect and  placed a large mesh plug in and sutured this with some numerous sutures of  Prolene starting at the tubercle and going from medial to lateral and  suturing it superiorly and inferiorly until it was well anchored. The  attenuated transversalis was closed over this and then the mesh patch  applied. It was started in with an anchoring suture at the pubic tubercle  and a running 2-0 Prolene used to take it to the level of the deep ring. It  was then tacked with 2-0 Prolene under the external oblique and onto the  internal oblique all the way around and up and then well lateral to the deep  ring where it went. The patch was split to allow the cord to come through.  This covered the area nicely and with no tension and anchored well. There  appeared to be adequate space for the cord structures.   A check was made for hemostasis and everything appeared to be dry.  Additional local was infiltrated again to make sure all deeper layers were  anesthetized. At this point the external oblique was closed with a 3-0  Vicryl, Scarpa's with 3-0 Vicryl, the skin with a 4-0 Monocryl subcuticular  plus Dermabond.   The patient tolerated the procedure well. There no operative complications  and all counts were correct.      Currie Paris, M.D.  Electronically Signed     CJS/MEDQ  D:  10/12/2005  T:  10/12/2005  Job:  [id number]   cc:   Valetta Mole. Swords, M.D. [facility]  [address] [place]  [place] [address]   Arturo Morton. Riley Kill, M.D. [facility]  [address]  [address]  [place]  [place] [address]

## 2011-02-04 NOTE — Procedures (Signed)
NAMESAMMUEL, BLICK NO.:  [id number]   MEDICAL RECORD NO.:  [id number]          PATIENT TYPE:  OUT   LOCATION:  SLEEP CENTER                 FACILITY:  [facility]   PHYSICIAN:  Barbaraann Share, MD,FCCPDATE OF BIRTH:  05/27/44   DATE OF STUDY:  10/17/2006                            NOCTURNAL POLYSOMNOGRAM   INDICATION FOR STUDY:  Persistent disorder of initiating and maintaining  sleep.   EPWORTH SLEEPINESS SCORE:  2.   SLEEP ARCHITECTURE:  The patient had a total sleep time of 337 minutes  with very small quantity of REM and never achieved slow wave sleep.  Sleep onset latency was normal and REM onset was prolonged at 186  minutes.  Sleep efficiency was decreased at 83%.   RESPIRATORY DATA:  The patient was found to have 16 hypopneas and 21  apneas for a respiratory disturbance index of 7 events per hour.  The  events were associated with moderate snoring and were clearly worse in  the supine position.  The patient did not meet split night protocol due  to the small numbers of events.   OXYGEN DATA:  There was O2 desaturation as low as 86% with the patient's  obstructive events.   CARDIAC DATA:  Rare PAC noted throughout but no clinically significant  arrhythmia.   MOVEMENT-PARASOMNIA:  The patient was found to have 55 leg jerks with 4  per hour with arousal or awakening.  There was also intermittent mild  paroxysm noted.   IMPRESSIONS-RECOMMENDATIONS:  1. Very mild obstructive sleep apnea/hypopnea syndrome with a      respiratory disturbance index of 7 events per hour and O2      desaturation as low as 86%.  Moderate snoring was noted and the      events were clearly worse in the supine position.  The patient's      degree of sleep apnea may be under estimated by the decreased      quantity of REM and low wave sleep.  2. Rare PAC noted throughout.  3. Intermittent bruxism was noted.  4. Moderate numbers of leg jerks with what appears to be at least  mild      sleep disruption.  It is unclear whether      this is more of an issue for the patient's sleep problems than his      actual sleep disordered breathing.  Clinical correlation is      suggested.      Barbaraann Share, MD,FCCP  Diplomate, American Board of Sleep  Medicine  Electronically Signed     KMC/MEDQ  D:  10/25/2006 15:54:59  T:  10/25/2006 20:06:20  Job:  [id number]

## 2011-02-04 NOTE — Procedures (Signed)
[facility]                              EXERCISE TREADMILL   NAME:Render, DANIELLE LENTO                      MRN:          [id number]  DATE:11/09/2006                            DOB:          1945-12-27    DURATION OF EXERCISE:  9 minutes.   MAXIMUM HEART RATE:  135.   PERCENT OF PREDICTED MAXIMUM HEART RATE:  85%.   COMMENTS:  Mr. Koors exercised today on the Bruce protocol.  He  experienced no chest pain.  He recently had some mild discomfort with  vacuuming; however, at peak exercise today there was no chest pain.  The  blood pressure rose appropriately, and there was no significant  diastolic increase in blood pressure.  There was borderline J-point  depression with slowly upsloping ST segments, but these did not appear  to be significant at 80 milliseconds following the J-point.  Moreover,  in early and late recovery, there was no significant horizontal ST  depression.  The study was nondiagnostic, with no evidence of high-grade  exercise-induced ischemia.   DISPOSITION:  The patient last underwent catheterization in 2003.  At  that time, his stents were patent in the right coronary artery and had  moderate disease of the LAD.  He has been continued to be treated  medically.  He has continued to get along well.  We plan to see him back  in followup in six months.  Aggressive lipid reduction will be  continued.  The patient does have leg jerks and is seeing Dr. Shelle Iron.  Finally, his last LDL was 80, with an HDL of 32.     Arturo Morton. Riley Kill, MD, [facility]  Electronically Signed    TDS/MedQ  DD: 11/09/2006  DT: 11/09/2006  Job #: [id number]

## 2011-02-04 NOTE — Assessment & Plan Note (Signed)
[facility]                             PULMONARY OFFICE NOTE   NAME:Brent [name], Brent [name]                      MRN:          [id number]  DATE:12/11/2006                            DOB:          [birth year]    HISTORY OF PRESENT ILLNESS:  The patient is a 65 year old gentleman who  I have been asked to see for multiple sleep issues.  The patient feels  that his primary issue is sleep onset insomnia as well as sleep  maintenance.  The patient started having difficulty with this after his  heart attack in 1994 and has been on chronic Ambien since that time.  If  he does not take the Ambien, it typically takes him 2 hours to get to  sleep and then tosses and turns the rest of the night.  Typically goes  to bed between 11 and 12 and gets up to 7 to 9 to start his day.  If he  takes the Ambien, he feels that he sleeps very well and the majority of  the time feels very rested in the mornings.  He does have some daytime  fatigue, but minimal sleepiness with periods of inactivity.  Overall, he  is barely satisfied with his alertness level during the day.  When the  patient does not take Ambien (ie, when he runs out), he will toss and  turn and will typically stay in bed and not get up.  He definitely has a  sense of frustration about his sleep and has anxiety if he runs out of  his medication.  The patient never takes naps during the day and does  not dose in front of the TV in the evenings.  In terms of his caffeine  intake, he will have 2-3 cups of coffee before 10-11 a.m. and drinks  caffeine-free sodas during the day.  He does not drink tea.  The patient  has no symptoms consistent with restless leg syndrome and his wife does  not complain of any specific ticking.  She states that he is just  restless.   PAST MEDICAL HISTORY:  1. Significant for myocardial infarction with percutaneous      intervention.  2. History of diabetes.  3. History of dyslipidemia.  4. History of allergic rhinitis.  5. Status post cholecystectomy.  6. History of multiple orthopedic procedures.   CURRENT MEDICATIONS:  1. Metformin 1000 mg daily.  2. Lisinopril 5 mg 1/2 daily.  3. TriCor 145 mg daily.  4. Crestor 20 mg daily.  5. Celexa 20 mg daily.  6. Niaspan 1000 mg daily.  7. Ambien 10 mg q.h.s.  8. Aspirin 325 mg daily.  9. Centrum Silver daily.  10.Multiple over-the-counter preparations.   ALLERGIES:  No known drug allergies.   SOCIAL HISTORY:  He is married and has children.  He is currently  retired.  He has a history of smoking in the past, but has not done so  in 20 years.   FAMILY HISTORY:  Remarkable for mother and father having heart disease.   REVIEW OF SYSTEMS:  As per history  of present illness.  Also see patient  intake form documented in the chart.   PHYSICAL EXAMINATION:  GENERAL:  He is an overweight white male in no  acute distress.  VITAL SIGNS:  Blood pressure 140/82, pulse 80, temperature 98.6, weight  226 pounds.  6 feet tall.  O2 saturation on room air is 98%.  HEENT:  Pupils equal, round, and reactive to light and accommodation.  Extraocular muscles intact.  Nares have deviated septum to the left with  significant narrowing.  Right shows turbinate hypertrophy with crusting.  Oropharynx has significant elongation of the soft palate and uvula with  side wall narrowing.  NECK:  Supple without lymphadenopathy or thyromegaly.  It is difficult  to assess JVD because of his large size.  CHEST:  Totally clear.  HEART:  Regular rate and rhythm.  No murmurs, rubs, or gallops.  ABDOMEN:  Soft and nontender with good bowel sounds.  GENITOURINARY:  RECTAL:  Not done and not indicated.  EXTREMITIES:  Lower extremities without edema, good pulses distally, no  calf tenderness.  NEUROLOGY:  Alert and oriented with no motor deficits.   LABORATORY DATA:  The patient recently underwent a nocturnal  polysomnography on October 17, 2006, where he  was found to have very  mild obstructive sleep apnea with respiratory disturbance index of 7  events per hour and O2 saturations as low as 86%.  The patient was also  found to have moderate numbers of leg jerks with mild sleep disruption.   IMPRESSION:  1. Minimal obstructive sleep apnea documented by nocturnal      polysomnography.  The patient has loud snoring which is bothering      his wife, however, he does not feel that it is significantly      impacting his quality of life during the day.  Certainly, his      degree of sleep apnea is not a health risk for him.  In terms of      treatment of this, I think we could consider weight loss alone as a      trial versus upper airway surgery versus CPAP.  Really this is more      of a sleep disruption issue for the wife than it is truly bothering      the patient.  2. Classic psychophysiologic insomnia with current addiction to      Ambien.  The patient clearly has a sense of frustration of going to      bed if he does not take any sleeping pill.  I talked with him about      the longterm issues with chronic Ambien use and he is willing to      try to come off the medication.   PLAN:  1. We will discontinue the Ambien and start Trazodone 50 mg q.h.s.,      which I hope will be short term.  2. Rozerem 8 mg q.h.s.  3. I discussed with him stimulus control/cognitive behavioral therapy      for his insomnia.  This will ultimately allow him to come off of      all medications possibly.  4. Refer to ENT for upper airway evaluation since the patient would      consider this as a viable treatment option.  He will also discuss      with his wife the other options of weight loss as well      as CPAP and get back with me.  5. The patient will follow up in 4 weeks or sooner if there are      problems.     Barbaraann Share, MD,FCCP  Electronically Signed    KMC/MedQ  DD: 12/11/2006  DT: 12/11/2006  Job #: [id number]  cc:   Valetta Mole. Swords,  MD  [facility]

## 2011-02-04 NOTE — Cardiovascular Report (Signed)
[facility]  Patient:    Brent [name], Brent [name] Visit Number: [id number] MRN: [id number]          Service Type: CAT Location: [facility] 2857 01 Attending Physician:  Ronaldo Miyamoto Dictated by:   Arturo Morton Riley Kill, M.D. [facility] Proc. Date: 11/29/01 Admit Date:  11/29/2001 Discharge Date: 11/29/2001                          Cardiac Catheterization  INDICATIONS:  The patient is a 65 year old well known to Brent [name].  He has had prior extensive stenting of the right coronary artery with later in-stent restenosis and rotational atherectomy with balloon dilatation.  He clinically has done well.  We have put him in the lipid clinic and been very aggressive about lipid lowering with 3-drug therapy including Niaspan, TriCor, and low dose of statins.  He has been able to reach target.  He has had some very mild symptoms as of late, and given the nature of his prior history, we were worried about progression of disease.  Risks and indications were discussed with the patient in detail.  PROCEDURES: 1. Left heart catheterization. 2. Selective coronary arteriography. 3. Selective left ventriculography.  DESCRIPTION OF PROCEDURE:  The procedure was performed from the right femoral artery using #6 French catheters.  Brent [name] tolerated the procedure well and there were no complications.  He was taken to the holding area where the sheath was removed and manual compression applied.  HEMODYNAMIC DATA: 1. Central aorta 130/67. 2. Left ventricle 141/20. 3. No gradient on pullback across the aortic valve.  ANGIOGRAPHIC DATA: 1. Ventriculography was performed in the RAO projection.  Overall systolic    function was preserved.  Ejection fraction was calculated at 61%.  No    segmental abnormalities, contraction were identified.  2. The left main coronary artery had about 30% tapered narrowing at the    ostium; however, it appeared to be well in excess of the size of the    diagnostic catheter.  3. The LAD coursed to the apex.  The LAD proper had about 70-75% stenosis    that was eccentric, relatively smooth, and just beyond the origin of a    septal perforator.  There was a tiny diagonal branch which had its exodus    from the main vessel at this location.  There was about 40% segmental    narrowing crossing the second diagonal branch.  The distal vessel wrapped    the apex.  4. The circumflex consisted of a ramus intermedius and an AV circumflex.  The    ramus vessel, as on the previous study, has about 70-75% focal stenosis    proximally.  The AV circumflex itself is without critical disease but does    demonstrate some mild luminal irregularity.  5. The right coronary artery demonstrates about 50% narrowing followed by 40%    narrowing just prior to the previously treated area.  The long area of    in-stent renarrowing is less than 30% in residual luminal narrowing and    this crosses the origin of the PDA which has been previously dilated.    There does not appear to be substantial narrowing.  The stent ends leading    into the first posterolateral branch and this first posterolateral branch    is relatively small and has about 50% proximal narrowing.  The second    posterolateral system is relatively small.  CONCLUSIONS: 1.  Norma left ventricular function. 2. Continued patency of both the LAD and ramus intermedius with similar    appearances to the previous study.  I reviewed the angiograms and the    current studies and compared them with the patients spouse present in the    angiographic suite. 3. The right coronary artery demonstrates continued patency at the site of    prior rotational atherectomy and balloon dilatation.  The stents remain    widely patent.  There is segmental disease throughout the artery.  DISPOSITION:  At the present time, I will plan to continue aggressive medical therapy.  The ramus intermedius is not in an optimal  location for percutaneous intervention particularly because of its closeness to both the left main and the AV circumflex.  The LAD proper could be stented but there is a small diagonal branch which would probably be lost in the process.  We will continue on a medical treatment program at the present time and I will see him back in followup closely in the office.  Dictated by:   Arturo Morton Riley Kill, M.D. [facility]  Attending Physician:  Ronaldo Miyamoto DD:  11/29/01 TD:  11/30/01 Job: [id number]

## 2011-02-04 NOTE — Assessment & Plan Note (Signed)
[facility]                            CARDIOLOGY OFFICE NOTE   NAME:Brent [name], Brent [name]                      MRN:          [id number]  DATE:09/14/2006                            DOB:          [birth year]    Mr. Iddings is in for followup.  In general, he is doing reasonably well  although his main complaint is fatigue.  He has been talking to the  physician at the [place] where he got his general followup.  He also sees Dr.  Cato Mulligan.  They raise the issue of whether he might have sleep apnea.  In  discussing this with the patient, he has had a lot of snoring.  He does  not sleep well at night.  He is tired frequently during the day.  His  last radionuclide imaging study was in October 2006 and did not have  significant ischemia.  His LDL at the [place] was 66, which is in keeping  with an excellent response and his most recent lipid profile done here  reveals an LDL of 80.   Today on examination, the blood pressure is 132/80, the pulse is 63, the  weight is 224.  The lung fields are clear and the cardiac rhythm is  regular.  No significant murmur is appreciated.   The EKG reveals normal sinus rhythm with nonspecific T-wave abnormality.   Overall, the patient is doing well.  He has raised concerns about the  possibility of sleep apnea and I have asked him to see Dr. Birdie Sons  with regard to whether or not a sleep study should be indicated.  With  regard to his cardiac situation, he has been stable and we will do a  routine stress test to reassess for ischemia in light of his multivessel  disease.  He also questioned whether or not he needs to remain on lipid-  lowering therapy but I pointed out to him multiple studies demonstrating  improved outcomes with low LDL.  We will see him back in followup in 6  months.     Arturo Morton. Riley Kill, MD, [facility]  Electronically Signed    TDS/MedQ  DD: 09/14/2006  DT: 09/14/2006  Job #: [id number]

## 2011-02-13 ENCOUNTER — Emergency Department (HOSPITAL_COMMUNITY): Payer: Medicare Other

## 2011-02-13 ENCOUNTER — Inpatient Hospital Stay (HOSPITAL_COMMUNITY)
Admission: EM | Admit: 2011-02-13 | Discharge: 2011-02-16 | DRG: 287 | Disposition: A | Discharge status: Home / Self Care | Payer: Medicare Other | Attending: Cardiology | Admitting: Cardiology

## 2011-02-13 DIAGNOSIS — Z880 Allergy status to penicillin: Secondary | ICD-10-CM

## 2011-02-13 DIAGNOSIS — E7800 Pure hypercholesterolemia, unspecified: Secondary | ICD-10-CM | POA: Diagnosis present

## 2011-02-13 DIAGNOSIS — J90 Pleural effusion, not elsewhere classified: Secondary | ICD-10-CM | POA: Diagnosis present

## 2011-02-13 DIAGNOSIS — Z7982 Long term (current) use of aspirin: Secondary | ICD-10-CM

## 2011-02-13 DIAGNOSIS — E119 Type 2 diabetes mellitus without complications: Secondary | ICD-10-CM | POA: Diagnosis present

## 2011-02-13 DIAGNOSIS — G4733 Obstructive sleep apnea (adult) (pediatric): Secondary | ICD-10-CM | POA: Diagnosis present

## 2011-02-13 DIAGNOSIS — R079 Chest pain, unspecified: Secondary | ICD-10-CM

## 2011-02-13 DIAGNOSIS — I10 Essential (primary) hypertension: Secondary | ICD-10-CM | POA: Diagnosis present

## 2011-02-13 DIAGNOSIS — Z794 Long term (current) use of insulin: Secondary | ICD-10-CM

## 2011-02-13 DIAGNOSIS — Z87891 Personal history of nicotine dependence: Secondary | ICD-10-CM

## 2011-02-13 DIAGNOSIS — E785 Hyperlipidemia, unspecified: Secondary | ICD-10-CM | POA: Diagnosis present

## 2011-02-13 DIAGNOSIS — Z9861 Coronary angioplasty status: Secondary | ICD-10-CM

## 2011-02-13 DIAGNOSIS — I200 Unstable angina: Secondary | ICD-10-CM | POA: Diagnosis present

## 2011-02-13 DIAGNOSIS — I2510 Atherosclerotic heart disease of native coronary artery without angina pectoris: Principal | ICD-10-CM | POA: Diagnosis present

## 2011-02-13 DIAGNOSIS — R0609 Other forms of dyspnea: Secondary | ICD-10-CM

## 2011-02-13 DIAGNOSIS — R0989 Other specified symptoms and signs involving the circulatory and respiratory systems: Secondary | ICD-10-CM

## 2011-02-13 DIAGNOSIS — E669 Obesity, unspecified: Secondary | ICD-10-CM | POA: Diagnosis present

## 2011-02-13 LAB — DIFFERENTIAL
Basophils Absolute: 0 10*3/uL (ref 0.0–0.1)
Basophils Relative: 0 % (ref 0–1)
Eosinophils Relative: 1 % (ref 0–5)
Monocytes Absolute: 0.7 10*3/uL (ref 0.1–1.0)
Monocytes Relative: 7 % (ref 3–12)

## 2011-02-13 LAB — COMPREHENSIVE METABOLIC PANEL
ALT: 29 U/L (ref 0–53)
Albumin: 4.1 g/dL (ref 3.5–5.2)
Alkaline Phosphatase: 114 U/L (ref 39–117)
BUN: 12 mg/dL (ref 6–23)
Chloride: 99 mEq/L (ref 96–112)
Potassium: 4.1 mEq/L (ref 3.5–5.1)
Sodium: 136 mEq/L (ref 135–145)
Total Bilirubin: 0.3 mg/dL (ref 0.3–1.2)
Total Protein: 7.2 g/dL (ref 6.0–8.3)

## 2011-02-13 LAB — GLUCOSE, CAPILLARY: Glucose-Capillary: 215 mg/dL — ABNORMAL HIGH (ref 70–99)

## 2011-02-13 LAB — CBC
HCT: 43.8 % (ref 39.0–52.0)
Hemoglobin: 15.3 g/dL (ref 13.0–17.0)
MCH: 30.7 pg (ref 26.0–34.0)
MCHC: 34.9 g/dL (ref 30.0–36.0)
RDW: 12.3 % (ref 11.5–15.5)

## 2011-02-13 LAB — CARDIAC PANEL(CRET KIN+CKTOT+MB+TROPI): Relative Index: 1.8 (ref 0.0–2.5)

## 2011-02-13 LAB — PROTIME-INR
INR: 0.93 (ref 0.00–1.49)
Prothrombin Time: 12.7 seconds (ref 11.6–15.2)

## 2011-02-13 LAB — HEPARIN LEVEL (UNFRACTIONATED): Heparin Unfractionated: 0.48 IU/mL (ref 0.30–0.70)

## 2011-02-13 LAB — TROPONIN I: Troponin I: <0.3 ng/mL (ref <0.30)

## 2011-02-13 LAB — CK TOTAL AND CKMB (NOT AT ARMC): Relative Index: 1.7 (ref 0.0–2.5)

## 2011-02-14 ENCOUNTER — Inpatient Hospital Stay (HOSPITAL_COMMUNITY): Payer: Medicare Other

## 2011-02-14 LAB — BASIC METABOLIC PANEL
BUN: 13 mg/dL (ref 6–23)
Creatinine, Ser: 0.69 mg/dL (ref 0.4–1.5)
GFR calc non Af Amer: >60 mL/min (ref >60)
Glucose, Bld: 168 mg/dL — ABNORMAL HIGH (ref 70–99)
Potassium: 4.2 mEq/L (ref 3.5–5.1)

## 2011-02-14 LAB — CARDIAC PANEL(CRET KIN+CKTOT+MB+TROPI): Troponin I: <0.3 ng/mL (ref <0.30)

## 2011-02-14 LAB — LIPID PANEL
HDL: 25 mg/dL — ABNORMAL LOW (ref >39)
Total CHOL/HDL Ratio: 4.2 RATIO
Triglycerides: 271 mg/dL — ABNORMAL HIGH (ref <150)
VLDL: 54 mg/dL — ABNORMAL HIGH (ref 0–40)

## 2011-02-14 LAB — GLUCOSE, CAPILLARY
Glucose-Capillary: 182 mg/dL — ABNORMAL HIGH (ref 70–99)
Glucose-Capillary: 272 mg/dL — ABNORMAL HIGH (ref 70–99)

## 2011-02-14 LAB — HEMOGLOBIN A1C: Hgb A1c MFr Bld: 8.3 % — ABNORMAL HIGH (ref <5.7)

## 2011-02-14 LAB — HEPARIN LEVEL (UNFRACTIONATED): Heparin Unfractionated: 0.59 IU/mL (ref 0.30–0.70)

## 2011-02-15 DIAGNOSIS — I2510 Atherosclerotic heart disease of native coronary artery without angina pectoris: Secondary | ICD-10-CM

## 2011-02-15 DIAGNOSIS — I519 Heart disease, unspecified: Secondary | ICD-10-CM

## 2011-02-15 LAB — CBC
MCH: 30.1 pg (ref 26.0–34.0)
MCV: 88.5 fL (ref 78.0–100.0)
Platelets: 235 10*3/uL (ref 150–400)
RBC: 4.42 MIL/uL (ref 4.22–5.81)
RDW: 12.2 % (ref 11.5–15.5)
WBC: 8.1 10*3/uL (ref 4.0–10.5)

## 2011-02-15 LAB — GLUCOSE, CAPILLARY
Glucose-Capillary: 179 mg/dL — ABNORMAL HIGH (ref 70–99)
Glucose-Capillary: 168 mg/dL — ABNORMAL HIGH (ref 70–99)
Glucose-Capillary: 355 mg/dL — ABNORMAL HIGH (ref 70–99)

## 2011-02-15 LAB — HEPARIN LEVEL (UNFRACTIONATED): Heparin Unfractionated: 0.5 IU/mL (ref 0.30–0.70)

## 2011-02-15 LAB — POCT ACTIVATED CLOTTING TIME: Activated Clotting Time: 128 seconds

## 2011-02-16 LAB — CBC
HCT: 39.8 % (ref 39.0–52.0)
Hemoglobin: 13.3 g/dL (ref 13.0–17.0)
MCH: 30 pg (ref 26.0–34.0)
MCHC: 33.4 g/dL (ref 30.0–36.0)
RDW: 12.5 % (ref 11.5–15.5)

## 2011-02-16 LAB — BASIC METABOLIC PANEL
BUN: 8 mg/dL (ref 6–23)
CO2: 24 mEq/L (ref 19–32)
Calcium: 9.1 mg/dL (ref 8.4–10.5)
Creatinine, Ser: 0.77 mg/dL (ref 0.4–1.5)
Glucose, Bld: 242 mg/dL — ABNORMAL HIGH (ref 70–99)

## 2011-02-17 ENCOUNTER — Other Ambulatory Visit (HOSPITAL_COMMUNITY): Payer: Medicare Other | Admitting: Radiology

## 2011-02-17 NOTE — H&P (Signed)
Brent [name], Brent NO.:  [id number]  MEDICAL RECORD NO.:  [id number]           PATIENT TYPE:  I  LOCATION:  2003                         FACILITY:  [facility]  PHYSICIAN:  Cassell Clement, M.D. DATE OF BIRTH:  1946/06/24  DATE OF ADMISSION:  02/13/2011 DATE OF DISCHARGE:                             HISTORY & PHYSICAL   PRIMARY CARDIOLOGIST:  Arturo Morton. Riley Kill, MD, [facility]  PRIMARY CARE PHYSICIAN:  Valetta Mole. Swords, MD  REASON FOR ADMISSION:  Chest pain and dyspnea.  HISTORY OF PRESENT ILLNESS:  This is a 65 year old obese Caucasian male with known history of CAD with stent to the right coronary artery in- stent restenosis with rotational atherectomy and balloon dilatation in 2000 with most recent cath revealing patent RCA and patent stent in 2003.  The patient was sitting at lunch having had eaten a sandwich and then had sudden onset of shortness of breath, nausea, dizziness, quivering, and became more short of breath.  He states that he had not felt that way before.  He did not take any nitroglycerin, as he did not have any.  He got up to go to the car to come to the emergency room and collapsed from weakness.  He was then brought to the emergency room by his wife.  He was very emotional at the time of this evaluation.  He states the pain is not as intense with the use of nitroglycerin and heparin, which was provided by ER prior to our seeing him.  He states his nausea is some better, but he remains very dizzy.  He was most recently seen by Dr. Riley Kill in April 2012 prior to a rotator cuff repair at that time which occurred approximately 1 week later.  The patient was given a prednisone injection post procedure.  He admits to some dietary noncompliance with his eating concerning his diabetic diet. When he saw Dr. Riley Kill last in April 2012, he was doing well then and there was no plan to repeat cardiac catheterization unless the pain returned.  The patient did  have a stress test in February 2008, which did not reveal any ischemia.  Currently, the patient is mildly anxious and short of breath in the ER with no further complaints.  REVIEW OF SYSTEMS:  Vertigo, chest pain, shortness of breath, presyncope, nausea.  All other systems reviewed and found to be negative unless listed above.  CODE STATUS:  Full code.  CURRENT MEDICATIONS:  Prior to admission, 1. Metformin 1000 mg b.i.d. 2. Lisinopril 2.5 mg daily. 3. TriCor 145 mcg daily. 4. Crestor 120 mg daily. 5. Celexa 20 mg daily. 6. Niaspan 1000 mg daily. 7. Aspirin 325 mg daily . 8. Centrum Silver daily. 9. Omega-3 b.i.d. 10.Flaxseed oil 1000 mg b.i.d. 11.Rozerem 8 mg at bedtime. 12.Vitamin B12 1000 mg daily. 13.Lantus insulin 48 units at bedtime.  ALLERGIES:  CODEINE, PENICILLIN, AND ORENCIA.  PAST MEDICAL HISTORY: 1. CAD.     a.     Stent to the right coronary artery with in-stent restenosis      and rotational atherectomy and balloon dilatation in 2000.  Most  recent cardiac catheterization patent, RCA patent stent in 2003. 2. Hypercholesterolemia. 3. Insulin-dependent diabetes. 4. Obstructive sleep apnea with CPAP. 5. Most recent rotator cuff repair of April 2012.  PAST SURGICAL HISTORY:  Cholecystectomy and hernia repair.  SOCIAL HISTORY:  He lives in [place] with his wife.  He is retired. He is married.  Does not smoke.  He occasionally drinks.  Negative for illicit drug use.  LABORATORY DATA:  Current labs, sodium 136, potassium 4.1, chloride 99, CO2 of 21, BUN 12, creatinine 0.76, glucose 256.  Hemoglobin 15.3, hematocrit 43.8, white blood cells 10.1, platelets 272.  D-dimer 0.38. PT 12.7, INR 0.93.  Troponin less than 0.30.  RADIOLOGY:  Chest x-ray, bibasilar atelectasis with possible layering pleural effusion at the left hemothorax.  EKG, sinus rhythm, rate of 91 beats per minute with lateral T-wave flattening.  PHYSICAL EXAM:  VITAL SIGNS:  Blood pressure  156/80, pulse 89, respirations 24, temperature 97.4, O2 sat 100% on 2 liters. GENERAL:  He is dyspneic and anxious. HEENT:  Head is normocephalic and atraumatic.  Eyes, PERRLA. NECK:  Supple without JVD, carotid bruit, or thyromegaly. CARDIOVASCULAR:  Regular rate and rhythm with 1/6 systolic murmur. LUNGS:  Essentially are clear to auscultation, mildly diminished in the bases. ABDOMEN:  Soft, nontender with 2+ bowel sounds. EXTREMITIES:  Without clubbing, cyanosis, or edema. MUSCULOSKELETAL:  No joint deformity or effusions. NEURO:  Cranial nerves II through XII are grossly intact.  IMPRESSION: 1. Unstable angina with sudden onset of chest pain after eating with     associated dyspnea, dizziness, and nausea.  EKG shows T-wave     flattening in inferior lateral leads.  D-dimer was found to be     negative.  We will admit to check enzymes and rule out myocardial     infarction.  Continue heparin and nitroglycerin. 2. Known history of coronary artery disease with stent to the right     coronary artery and PTCA in 2003.  Planned cath for Tuesday to rule     out progression of CAD at Dr. Rosalyn Charters discretion as he is his     primary cardiologist. 3. Unilateral pleural effusion on the left.  We will continue to     monitor this with repeat chest x-ray.  PLAN:  This patient has been seen and examined by myself and Dr. Patty Sermons in the emergency room.  This is a 65 year old male with known history of CAD, who presents with sudden onset of dizziness, dyspnea, and chest pain.  EKG in ER is nonacute.  Initial cardiac enzymes are negative.  Chest x-ray shows questionable pleural effusion.  D- dimer is negative.  Recent shoulder surgery 6 weeks ago.  Exam reveals very anxious patient and wife.  We will admit, continue heparin and nitroglycerin and repeat serial enzymes, possible cath on Tuesday at Dr. Rosalyn Charters discretion.     [name]. Lyman Bishop,  NP   ______________________________ Cassell Clement, M.D.    KML/MEDQ  D:  02/13/2011  T:  02/13/2011  Job:  [id number]  cc:   Valetta Mole. Swords, MD  Electronically Signed by Joni Reining NP on 02/14/2011 09:11:46 PM Electronically Signed by Cassell Clement M.D. on 02/17/2011 12:48:34 PM

## 2011-02-17 NOTE — Cardiovascular Report (Signed)
NAMEDEIGO, [name] NO.:  [id number]  MEDICAL RECORD NO.:  [id number]           PATIENT TYPE:  I  LOCATION:  2003                         FACILITY:  [facility]  PHYSICIAN:  Brent Morton. Riley Kill, MD, FACCDATE OF BIRTH:  04-12-1946  DATE OF PROCEDURE:  02/15/2011 DATE OF DISCHARGE:                           CARDIAC CATHETERIZATION   INDICATIONS:  [name] is a very delightful gentleman well known to me. He presented to the hospital with some substernal chest pain that was worrisome for unstable angina.  His EKG was nondiagnostic and his enzymes were largely negative.  He has had some recurrent sharp left chest pain.  However, his enzymes have remained negative.  He has been on intravenous heparin.  He is brought today for repeat catheterization. His last cath was in 2003.  He has had prior stenting of the right coronary artery followed by rotational atherectomy for in-stent restenosis, but with documented continued patency after a long trial. Risks, benefits, and alternatives were discussed and the patient consented to proceed.  PROCEDURES: 1. Left heart catheterization. 2. Selective coronary arteriography. 3. Selective left ventriculography.  DESCRIPTION OF PROCEDURE:  The procedure was performed from the right femoral artery using 4-French catheters.  He tolerated the procedure without complication and was taken to the holding area in satisfactory clinical condition.  I reviewed the pictures with the patient in the room and then subsequently reviewed the entire set with his family including his wife and sister.  There were no major complications.  HEMODYNAMIC DATA: 1. The central aortic pressure is 159/83, mean 112. 2. LV pressure 131/17. 3. There was no gradient or pullback across aortic valve.  ANGIOGRAPHIC DATA: 1. Ventriculography done in the RAO projection reveals preserved     global systolic function.  No definite segmental wall motion  abnormalities are identified.  There may be trace mitral     regurgitation, but none of hemodynamic significance. 2. The left main is slightly tapered, but without significant focal     narrowing. 3. The LAD is fairly heavily calcified.  There is an eccentric plaque     of approximately 75% noted in the midvessel.  This is just after     the septal and overlying the takeoff of the small first diagonal.     The small first diagonal has about 80% narrowing, unchanged from     the previous study.  The mid and distal LAD has about 50%     narrowing, is calcified, but is largely unchanged from prior     studies and the vessel wraps the apex. 4. The circumflex is a large-caliber vessel.  There is a ramus     intermedius that has 70-75% proximal segmental plaque.  The vessel     beyond this is widely patent.  When compared carefully to the     previous study, there does not appear to be a substantial change.     The AV circumflex is a large-caliber vessel without significant     focal narrowing.  It is also calcified. 5. The right coronary artery has been previously stented.  There is a  long stent in the mid vessel extending down to the distal vessel     overlapping the PDA takeoff, and extending into the posterolateral     segment across the first PLL-1.  The stent has been previously     opened, then subsequently rotobladed.  The vessel remains widely     patent with about 40% diffuse in-stent re-narrowing, but without     significant focal narrowing.  CONCLUSION: 1. Well-preserved left ventricular systolic function. 2. Continued 70-75% eccentric stenosis of the left anterior descending     artery, largely unchanged from 2003. 3. 70-75% proximal ramus intermedius stenosis, again appears unchanged     from 2003. 4. Prior extensive stenting of the right coronary artery with     subsequent treatment with rotational atherectomy with continued     patency.  DISPOSITION:  We will repeat  the patient's D-dimer.  Continued observation will be important.  There is a question of a pleural effusion, but repeat PA and lateral chest x-ray has been unremarkable. He has had a cough.  He has had a fair amount of stress recently.  The pain is sharp, not inspirational.  His D-dimer has been negative.  Other potential causes will be sought.     Brent Morton. Riley Kill, MD, [facility]     TDS/MEDQ  D:  02/15/2011  T:  02/16/2011  Job:  [id number]  cc:   Brent Mole. Swords, MD [facility]  Electronically Signed by Brent Pons MD [facility] on 02/17/2011 09:21:20 AM

## 2011-02-18 ENCOUNTER — Encounter: Payer: Self-pay | Admitting: Internal Medicine

## 2011-02-18 ENCOUNTER — Ambulatory Visit (INDEPENDENT_AMBULATORY_CARE_PROVIDER_SITE_OTHER): Payer: 59 | Admitting: Internal Medicine

## 2011-02-18 DIAGNOSIS — I2510 Atherosclerotic heart disease of native coronary artery without angina pectoris: Secondary | ICD-10-CM

## 2011-02-18 DIAGNOSIS — F341 Dysthymic disorder: Secondary | ICD-10-CM

## 2011-02-18 DIAGNOSIS — E119 Type 2 diabetes mellitus without complications: Secondary | ICD-10-CM

## 2011-02-18 MED ORDER — BENZONATATE 100 MG PO CAPS: 100.0000 mg | ORAL_CAPSULE | Freq: Two times a day (BID) | ORAL | Status: DC | PRN

## 2011-02-18 MED ORDER — LORAZEPAM 0.5 MG PO TABS: 0.5000 mg | ORAL_TABLET | Freq: Every day | ORAL | Status: DC | PRN

## 2011-02-18 NOTE — Assessment & Plan Note (Signed)
Reviewed cath report--he has f/u with dr Tedra Senegal

## 2011-02-18 NOTE — Assessment & Plan Note (Signed)
Admits to worsening depression---[facility]  Has a lot of anxiety---will try lorazepam

## 2011-02-18 NOTE — Progress Notes (Signed)
  Subjective:    Patient ID: Brent [name], male    DOB: [birth year], 65 y.o.   MRN: [id number]  HPI  Cough---2 weeks. Occasionally productive. Note on lisinopril  Anxiety/depression--pt describes difficult family situation He has stopped citalopram--now on sertraline  Patient with hypertension. He is tolerating his medications without difficulty.  Coronary artery disease patient denies any chest pain, shortness of breath or PND.  Past Medical History  Diagnosis Date  . CAD (coronary artery disease)   . Myocardial infarction   . HTN (hypertension)   . Hyperlipidemia   . Obesity   . Diabetes mellitus   . Fatigue   . OSA (obstructive sleep apnea)   . Persistent disorder of initiating or maintaining sleep   . Erectile dysfunction   . Nephrolithiasis   . Diverticulosis    Past Surgical History  Procedure Date  . Coronary stent placement     stenting of the right coronary artery with followup rotational  atherectomy.   . Cholecystectomy   . Colonoscopy   . Coronary angioplasty   . Orthopedic surgery     reports that he has quit smoking. He does not have any smokeless tobacco history on file. He reports that he does not use illicit drugs. His alcohol history not on file. family history includes Heart disease in an unspecified family member. Allergies  Allergen Reactions  . Codeine Phosphate     REACTION: nausea and vomiting  . Penicillins     REACTION: unspecified     Review of Systems    patient denies chest pain, shortness of breath, orthopnea. Denies lower extremity edema, abdominal pain, change in appetite, change in bowel movements. Patient denies rashes, musculoskeletal complaints. No other specific complaints in a complete review of systems.    Objective:   Physical Exam  well-developed well-nourished male in no acute distress. HEENT exam atraumatic, normocephalic, neck supple without jugular venous distention. Chest clear to auscultation cardiac exam S1-S2 are  regular. Abdominal exam overweight with bowel sounds, soft and nontender. Extremities no edema. Neurologic exam is alert with a normal gait.        Assessment & Plan:

## 2011-02-21 ENCOUNTER — Other Ambulatory Visit (HOSPITAL_COMMUNITY): Payer: Medicare Other | Admitting: Radiology

## 2011-02-22 ENCOUNTER — Ambulatory Visit (HOSPITAL_COMMUNITY): Payer: Medicare Other | Attending: Cardiology | Admitting: Radiology

## 2011-02-22 DIAGNOSIS — E119 Type 2 diabetes mellitus without complications: Secondary | ICD-10-CM | POA: Insufficient documentation

## 2011-02-22 DIAGNOSIS — I252 Old myocardial infarction: Secondary | ICD-10-CM | POA: Insufficient documentation

## 2011-02-22 DIAGNOSIS — I2510 Atherosclerotic heart disease of native coronary artery without angina pectoris: Secondary | ICD-10-CM | POA: Insufficient documentation

## 2011-02-22 DIAGNOSIS — E669 Obesity, unspecified: Secondary | ICD-10-CM | POA: Insufficient documentation

## 2011-02-22 DIAGNOSIS — R0609 Other forms of dyspnea: Secondary | ICD-10-CM

## 2011-02-22 DIAGNOSIS — I10 Essential (primary) hypertension: Secondary | ICD-10-CM | POA: Insufficient documentation

## 2011-02-22 DIAGNOSIS — G4733 Obstructive sleep apnea (adult) (pediatric): Secondary | ICD-10-CM | POA: Insufficient documentation

## 2011-02-22 DIAGNOSIS — E785 Hyperlipidemia, unspecified: Secondary | ICD-10-CM | POA: Insufficient documentation

## 2011-02-22 DIAGNOSIS — R0789 Other chest pain: Secondary | ICD-10-CM

## 2011-02-22 MED ORDER — TECHNETIUM TC 99M TETROFOSMIN IV KIT
33.0000 | PACK | Freq: Once | INTRAVENOUS | Status: AC | PRN
  Administered 2011-02-22: 33 via INTRAVENOUS

## 2011-02-22 MED ORDER — TECHNETIUM TC 99M TETROFOSMIN IV KIT
11.0000 | PACK | Freq: Once | INTRAVENOUS | Status: AC | PRN
  Administered 2011-02-22: 11 via INTRAVENOUS

## 2011-02-22 MED ORDER — REGADENOSON 0.4 MG/5ML IV SOLN
0.4000 mg | Freq: Once | INTRAVENOUS | Status: AC
  Administered 2011-02-22: 0.4 mg via INTRAVENOUS

## 2011-02-22 NOTE — Progress Notes (Signed)
[facility] SITE 3 NUCLEAR MED [address] [place] [address] [phone]  Cardiology Nuclear Med Study  Brent [name] is a 65 y.o. male [id number] 17-Apr-1946   Nuclear Med Background Indication for Stress Test:  Evaluation for Ischemia, Stent Patency and 02/16/11 Post Hospital: CP, (-) enzymes., >Cath History:  '99 HSRA/Stent-RCA, '06 OZH:YQMVHQ, EF=55%, '00 HSRA/PTCA-RCA, '10 GXT:BL abnormal, 5/12 Echo:EF=50-55%, mild anterior and antero-septal hypokinesis, 5/12 Cath:patent stent with n/o CAD  Cardiac Risk Factors: Family History - CAD, History of Smoking, Hypertension, IDDM Type 2 and Lipids  Symptoms:  Chest Pain, Pressure and Tightness (none since discharge), Dizziness, DOE, Fatigue, Nausea and SOB   Nuclear Pre-Procedure Caffeine/Decaff Intake:  None NPO After: 8:00pm   Lungs:  Clear.  O2 sat 99% on RA. IV 0.9% NS with Angio Cath:  18g  IV Site: R Antecubital  IV Started by:  Stanton Kidney, EMT-P  Chest Size (in):  44 Cup Size: n/a  Height: 6' (1.829 m)  Weight:  211 lb (95.709 kg)  BMI:  Body mass index is 28.62 kg/(m^2). Tech Comments:  NA    Nuclear Med Study 1 or 2 day study: 1 day  Stress Test Type:  Treadmill/Lexiscan  Reading MD: Charlton Haws, MD  Order Authorizing Provider:  Shawnie Pons, MD  Resting Radionuclide: Technetium 18m Tetrofosmin  Resting Radionuclide Dose: 11 mCi   Stress Radionuclide:  Technetium 49m Tetrofosmin  Stress Radionuclide Dose: 33 mCi           Stress Protocol Rest HR: 75 Stress HR: 116  Rest BP: 119/69 Stress BP: 169/67  Exercise Time (min): 2:00 METS: n/a   Predicted Max HR: 154 bpm % Max HR: 75.32 bpm Rate Pressure Product: 46962   Dose of Adenosine (mg):  n/a Dose of Lexiscan: 0.4 mg  Dose of Atropine (mg): n/a Dose of Dobutamine: n/a mcg/kg/min (at max HR)  Stress Test Technologist: Smiley Houseman, CMA-N  Nuclear Technologist:  Domenic Polite, CNMT     Rest Procedure:  Myocardial perfusion  imaging was performed at rest 45 minutes following the intravenous administration of Technetium 61m Tetrofosmin.  Rest ECG: Non-specific ST-T wave changes  Stress Procedure:  The patient received IV Lexiscan 0.4 mg over 15-seconds with concurrent low level exercise and then Technetium 63m Tetrofosmin was injected at 30-seconds while the patient continued walking one more minute.  There were no diagnostic changes with Lexiscan.  There were nonspecific T-wave changes in V4-V6.  Quantitative spect images were obtained after a 45-minute delay.  Stress ECG: no changes  QPS Raw Data Images:  Normal; no motion artifact; normal heart/lung ratio. Stress Images:  Normal homogeneous uptake in all areas of the myocardium. Rest Images:  Normal homogeneous uptake in all areas of the myocardium. Subtraction (SDS):  Normal Transient Ischemic Dilatation (Normal <1.22):  1.03 Lung/Heart Ratio (Normal <0.45):  0.26  Quantitative Gated Spect Images QGS EDV:  111 ml QGS ESV:  42 ml QGS cine images:  NL LV Function; NL Wall Motion QGS EF: 63%  Impression Exercise Capacity:  Lexiscan with low level exercise. BP Response:  Normal blood pressure response. Clinical Symptoms:  No chest pain. ECG Impression:  No significant ST segment change suggestive of ischemia. Comparison with Prior Nuclear Study: No images to compare  Overall Impression:  Normal stress nuclear study.   Charlton Haws

## 2011-02-22 NOTE — Assessment & Plan Note (Signed)
Advise aggressive weight loss, low carbohydrate diet.

## 2011-02-23 ENCOUNTER — Ambulatory Visit (INDEPENDENT_AMBULATORY_CARE_PROVIDER_SITE_OTHER): Payer: 59 | Admitting: Cardiology

## 2011-02-23 ENCOUNTER — Encounter: Payer: Self-pay | Admitting: Cardiology

## 2011-02-23 DIAGNOSIS — E785 Hyperlipidemia, unspecified: Secondary | ICD-10-CM

## 2011-02-23 DIAGNOSIS — I2510 Atherosclerotic heart disease of native coronary artery without angina pectoris: Secondary | ICD-10-CM

## 2011-02-23 DIAGNOSIS — E119 Type 2 diabetes mellitus without complications: Secondary | ICD-10-CM

## 2011-02-23 NOTE — Progress Notes (Addendum)
Copy routed to Dr. Jobe Igo reviewed with patient at the time of followup office visit in conjunction with catheterization data.  Reviewed in detail.  Based on findings, continued medical therapy and observation is warranted. TS

## 2011-02-23 NOTE — Patient Instructions (Signed)
Your physician recommends that you schedule a follow-up appointment in: 2 months.  Your physician recommends that you continue on your current medications as directed. Please refer to the Current Medication list given to you today.  

## 2011-02-25 ENCOUNTER — Ambulatory Visit (INDEPENDENT_AMBULATORY_CARE_PROVIDER_SITE_OTHER): Payer: 59 | Admitting: Internal Medicine

## 2011-02-25 ENCOUNTER — Encounter: Payer: Self-pay | Admitting: Internal Medicine

## 2011-02-25 DIAGNOSIS — F341 Dysthymic disorder: Secondary | ICD-10-CM

## 2011-02-25 MED ORDER — TADALAFIL 20 MG PO TABS: ORAL_TABLET | ORAL | Status: DC

## 2011-02-25 MED ORDER — SERTRALINE HCL 100 MG PO TABS: 25.0000 mg | ORAL_TABLET | Freq: Every day | ORAL | Status: DC

## 2011-02-25 MED ORDER — LORAZEPAM 0.5 MG PO TABS: 0.5000 mg | ORAL_TABLET | Freq: Two times a day (BID) | ORAL | Status: DC | PRN

## 2011-02-25 NOTE — Progress Notes (Signed)
  Subjective:    Patient ID: Brent [name], male    DOB: 11/07/1945, 65 y.o.   MRN: [id number]  HPI  Pt wants to discuss anxiety---I'm miserable  After leaving the hospital---had a red uretheral opening---better now.  ED---he would like to try a medication  Past Medical History  Diagnosis Date  . CAD (coronary artery disease)   . Myocardial infarction   . HTN (hypertension)   . Hyperlipidemia   . Obesity   . Diabetes mellitus   . Fatigue   . OSA (obstructive sleep apnea)   . Persistent disorder of initiating or maintaining sleep   . Erectile dysfunction   . Nephrolithiasis   . Diverticulosis    Past Surgical History  Procedure Date  . Coronary stent placement     stenting of the right coronary artery with followup rotational  atherectomy.   . Cholecystectomy   . Colonoscopy   . Coronary angioplasty   . Orthopedic surgery     reports that he quit smoking about 30 years ago. He does not have any smokeless tobacco history on file. He reports that he drinks alcohol. He reports that he does not use illicit drugs. family history includes Heart disease in an unspecified family member. Allergies  Allergen Reactions  . Codeine Phosphate     REACTION: nausea and vomiting  ---- not all codeine products  . Penicillins     REACTION: unspecified    Review of Systems  patient denies chest pain, shortness of breath, orthopnea. Denies lower extremity edema, abdominal pain, change in appetite, change in bowel movements. Patient denies rashes, musculoskeletal complaints. No other specific complaints in a complete review of systems.     Objective:   Physical Exam  well-developed well-nourished male in no acute distress. HEENT exam atraumatic, normocephalic, neck supple without jugular venous distention. Chest clear to auscultation cardiac exam S1-S2 are regular. Abdominal exam overweight with bowel sounds, soft and nontender. Extremities no edema. Neurologic exam is alert with a  normal gait.        Assessment & Plan:  Urethral erythema has resolved. On examination there is no concern. No further evaluation necessary.  In regards to erectile dysfunction I have given him a prescription for Cialis. He understands the side effects and risks.

## 2011-02-27 NOTE — Assessment & Plan Note (Addendum)
I reveiwed his cath study with him today, and extensively reviewed it with his family in the hospital.  I cannot appreciate a discernible change in his anatomy.  I am reluctant to do to much with this finding, given the nature of its stability.  THere were some atypical features. CXR is unremarkable, d dimer was negative.  Cor anatomy unchanged.   We will follow him closely, and if symptoms persist, may opt for a nuclear stress test to identify location or severity of ischemia.  He is likely not going to tolerate nitrates.  See in follow up in two months, or ealier should a problem occur.

## 2011-02-27 NOTE — Progress Notes (Signed)
HPI:  [name] came in today and we had a long discussion, and basically reviewed his anatomy in detail regarding his recent cath study.  He had an episode of chest pain, and on followup study, the RCA stents were patent, and the intermediate appeared largely unchanged.  He has not had specific exertional related symptoms, and has been under considerable stress per his wife, and he admits to this.  His son, with a small child, is going through a divorce, and it is tearing him up.  He has had some baseline depression in the past.  Since discharge, he has not had a lot more discomfort.  He thinks he may need more anxiety medication, and I outlined why MDs are reluctant to give too much in the way of anxiolytics.  He also had a headache with the imdur.  His ddimer was negative times two, and CXR was unremarkable.    Current Outpatient Prescriptions  Medication Sig Dispense Refill  . amLODipine (NORVASC) 2.5 MG tablet Take 1 tablet by mouth daily as needed.       Marland Kitchen aspirin 325 MG tablet Take 325 mg by mouth daily.        . fenofibrate (TRICOR) 145 MG tablet Take 145 mg by mouth daily.        . fish oil-omega-3 fatty acids 1000 MG capsule Take 2 g by mouth daily.        Marland Kitchen FLAXSEED, LINSEED, PO 1 tab po qd       . fluticasone (FLONASE) 50 MCG/ACT nasal spray Spray twice up each nostril as needed      . lisinopril (PRINIVIL,ZESTRIL) 5 MG tablet Take 5 mg by mouth daily.        . metformin (FORTAMET) 1000 MG (OSM) 24 hr tablet Take 1,000 mg by mouth 2 (two) times daily with a meal.       . Multiple Vitamins-Minerals (CENTRUM SILVER PO) 1 tab po qd       . niacin (NIASPAN) 1000 MG CR tablet Take 1,000 mg by mouth at bedtime.        . rosuvastatin (CRESTOR) 20 MG tablet Take 20 mg by mouth daily.        . vitamin B-12 (CYANOCOBALAMIN) 1000 MCG tablet Take 1,000 mcg by mouth daily.        . benzonatate (TESSALON) 100 MG capsule Take 1 capsule by mouth daily.      Marland Kitchen LORazepam (ATIVAN) 0.5 MG tablet Take 1  tablet (0.5 mg total) by mouth 2 (two) times daily as needed.  60 tablet  1  . sertraline (ZOLOFT) 100 MG tablet Take 0.5 tablets (50 mg total) by mouth daily. Begin after finishing celexa  90 tablet  3  . tadalafil (CIALIS) 20 MG tablet 1/2 - 1 po prior to intercourse  10 tablet  0  . DISCONTD: ALPRAZolam (XANAX) 0.25 MG tablet Take 1 tablet (0.25 mg total) by mouth 3 (three) times daily as needed for anxiety.  90 tablet  0    Allergies  Allergen Reactions  . Codeine Phosphate     REACTION: nausea and vomiting  ---- not all codeine products  . Penicillins     REACTION: unspecified    Past Medical History  Diagnosis Date  . CAD (coronary artery disease)   . Myocardial infarction   . HTN (hypertension)   . Hyperlipidemia   . Obesity   . Diabetes mellitus   . Fatigue   . OSA (obstructive sleep apnea)   .  Persistent disorder of initiating or maintaining sleep   . Erectile dysfunction   . Nephrolithiasis   . Diverticulosis     Past Surgical History  Procedure Date  . Coronary stent placement     stenting of the right coronary artery with followup rotational  atherectomy.   . Cholecystectomy   . Colonoscopy   . Coronary angioplasty   . Orthopedic surgery     Family History  Problem Relation Age of Onset  . Heart disease      History   Social History  . Marital Status: Married    Spouse Name: N/A    Number of Children: N/A  . Years of Education: N/A   Occupational History  . Not on file.   Social History Main Topics  . Smoking status: Former Smoker -- 1.5 packs/day for 30 years    Quit date: 09/19/1980  . Smokeless tobacco: Not on file  . Alcohol Use: Yes     social  . Drug Use: No  . Sexually Active: Not on file   Other Topics Concern  . Not on file   Social History Narrative  . No narrative on file    ROS: Please see the HPI.  All other systems reviewed and negative.  PHYSICAL EXAM:  BP 142/64  Pulse 95  Ht 6' (1.829 m)  Wt 213 lb (96.616  kg)  BMI 28.89 kg/m2  General: Well developed, well nourished, in no acute distress. Head:  Normocephalic and atraumatic. Neck: no JVD Lungs: Clear to auscultation and percussion. Heart: Normal S1 and S2.  No murmur, rubs or gallops.  Cath site is ok.  Pulses: Pulses normal in all 4 extremities. Extremities: No clubbing or cyanosis. No edema. Neurologic: Alert and oriented x 3.  EKG:  ASSESSMENT AND PLAN:

## 2011-02-27 NOTE — Assessment & Plan Note (Signed)
LDL was at target, but triglycerides high on admission.  Likely secondary to DM which does not appear in good control.

## 2011-02-27 NOTE — Assessment & Plan Note (Signed)
Recent hgb AIc was elevated in the 8 range.  Will defer to Dr. Cato Mulligan.

## 2011-03-04 NOTE — Assessment & Plan Note (Signed)
Discuss treatment options. Will increase sertraline. We'll use lorazepam as needed. Side effects of both discussed. He'll followup with me in one month.

## 2011-04-07 NOTE — Discharge Summary (Signed)
  NAMEKARRY, CAUSER NO.:  [id number]  MEDICAL RECORD NO.:  [id number]           PATIENT TYPE:  I  LOCATION:  2003                         FACILITY:  [facility]  PHYSICIAN:  Arturo Morton. Riley Kill, MD, FACCDATE OF BIRTH:  09-May-1946  DATE OF ADMISSION:  02/13/2011 DATE OF DISCHARGE:  02/16/2011                              DISCHARGE SUMMARY   ADDENDUM  Medication was inadvertently left off Mr. Fricker discharge med list, it is Norvasc 2.5 mg daily.  All other medications and discharge instructions are unchanged.     Theodore Demark, PA-C   ______________________________ Arturo Morton. Riley Kill, MD, [facility]    RB/MEDQ  D:  02/16/2011  T:  02/16/2011  Job:  [id number]  Electronically Signed by Theodore Demark PA-C on 03/03/2011 03:27:51 PM Electronically Signed by Shawnie Pons MD [facility] on 04/07/2011 07:18:35 AM

## 2011-04-07 NOTE — Discharge Summary (Signed)
Brent [name], Brent [name] NO.:  [id number]  MEDICAL RECORD NO.:  [id number]           PATIENT TYPE:  I  LOCATION:  2003                         FACILITY:  [facility]  PHYSICIAN:  Arturo Morton. Riley Kill, MD, FACCDATE OF BIRTH:  March 31, 1946  DATE OF ADMISSION:  02/13/2011 DATE OF DISCHARGE:  02/16/2011                              DISCHARGE SUMMARY   PROCEDURES: 1. Cardiac catheterization. 2. Coronary arteriogram. 3. Left ventriculogram. 4. A 2-D echocardiogram. 5. Two-view chest x-ray.  PRIMARY FINAL DISCHARGE DIAGNOSIS:  Chest pain, medical therapy for angina recommended.  SECONDARY DIAGNOSES: 1. Obesity. 2. Status post stent to the right coronary artery and subsequent     rotational atherectomy and percutaneous transluminal coronary     angioplasty for in-stent restenosis, last intervention in 2000. 3. Status post rotator cuff repair in April 2012. 4. Allergy or intolerance to CODEINE, PENICILLIN, and ORENCIA. 5. Hyperlipidemia. 6. Insulin-dependent diabetes. 7. Obstructive sleep apnea on continuous positive airway pressure. 8. Status post cholecystectomy and hernia repair. 9. Remote history of tobacco use. 10.Hypertension.  TIME AT DISCHARGE:  Thirty three minutes.  HOSPITAL COURSE:  Brent [name] is a 65 year old male with a history of coronary artery disease.  He had recurrent chest pain and came to the hospital where he was admitted for further evaluation.  His cardiac enzymes were negative for MI.  His blood sugars were followed closely.  Hemoglobin A1c was 8.3 with a lipid profile showing total cholesterol 104, triglycerides 271, HDL 25, LDL 25.  On Feb 15, 2011, he was taken to the cath lab.  Cardiac catheterization showed a 75% and 50% lesion in the LAD, first diagonal 80%, circumflex 70%, RCA 40% in-stent restenosis with an EF that was normal.  He had a D-dimer done that was within normal limits at 0.28.  A two-view chest x-ray showed no active disease  and a large dense calcification in the left upper quadrant was unchanged in over 4 years.  This was felt to be benign.  On Feb 16, 2011, Brent [name] was still having episodes of left lateral chest pain that the patient thought was preceded by anxiety. Dr. Riley Kill felt that medical therapy was the best option.  He is to continue on an aspirin and statin and amlodipine was added to his medication regimen.  He was considered stable for discharge on Feb 16, 2011.  DISCHARGE INSTRUCTIONS:  His activity level is to be increased gradually with no lifting for 2 weeks and no driving for 2 days.  He is encouraged to stick to a low-sodium, heart-healthy diet.  He is to call our office for problems with the cath site.  He is to get a stress test on February 22, 2011, at 9 a.m. and follow up with Dr. Riley Kill on February 23, 2011, at 10:45.  He is to follow up with Dr. Cato Mulligan as needed.  DISCHARGE MEDICATIONS: 1. Tylenol Extra Strength 2 tablets q.6 h. p.r.n. 2. Lisinopril 5 mg daily. 3. Zoloft 50 mg daily. 4. Niaspan 1000 mg daily.5. Omega-3 one tablet b.i.d. 6. Rozerem 8 mg daily. 7. Metformin 1000 mg b.i.d., hold until February 18, 2011. 8. Flonase at bedtime. 9. TriCor q.p.m. 10.Crestor 20 mg daily. 11.Lantus 50 units at bedtime. 12.Multivitamins daily. 13.Sublingual nitroglycerin p.r.n. 14.Aspirin 325 mg a day. 15.Flaxseed oil 1000 mg b.i.d. 16.Vitamin B12 1000 mg daily.     Theodore Demark, PA-C   ______________________________ Arturo Morton. Riley Kill, MD, [facility]    RB/MEDQ  D:  02/16/2011  T:  02/16/2011  Job:  [id number]  cc:   Valetta Mole. Swords, MD  Electronically Signed by Theodore Demark PA-C on 03/03/2011 03:27:26 PM Electronically Signed by Shawnie Pons MD [facility] on 04/07/2011 07:18:30 AM

## 2011-04-20 ENCOUNTER — Ambulatory Visit: Payer: Medicare Other | Admitting: Internal Medicine

## 2011-04-25 ENCOUNTER — Ambulatory Visit: Payer: Medicare Other | Admitting: Cardiology

## 2011-04-27 ENCOUNTER — Encounter: Payer: Self-pay | Admitting: Cardiology

## 2011-04-27 ENCOUNTER — Ambulatory Visit (INDEPENDENT_AMBULATORY_CARE_PROVIDER_SITE_OTHER): Payer: 59 | Admitting: Cardiology

## 2011-04-27 DIAGNOSIS — E785 Hyperlipidemia, unspecified: Secondary | ICD-10-CM

## 2011-04-27 DIAGNOSIS — I2510 Atherosclerotic heart disease of native coronary artery without angina pectoris: Secondary | ICD-10-CM

## 2011-04-27 DIAGNOSIS — I10 Essential (primary) hypertension: Secondary | ICD-10-CM

## 2011-04-27 NOTE — Progress Notes (Signed)
HPI:  He is stable at the present time.  He is not having chest pain.  Some of the same stresses are still there, but he is handling it better than he was.  We reveiwed the discussion regarding his anatomy, medical therapy vs revascularization.  Cath report reviewed.    Current Outpatient Prescriptions  Medication Sig Dispense Refill  . aspirin 325 MG tablet Take 325 mg by mouth daily.        . fenofibrate (TRICOR) 145 MG tablet Take 145 mg by mouth daily.        . fish oil-omega-3 fatty acids 1000 MG capsule Take 2 g by mouth daily.        Marland Kitchen FLAXSEED, LINSEED, PO 1 tab po qd       . fluticasone (FLONASE) 50 MCG/ACT nasal spray Spray twice up each nostril as needed      . lisinopril (PRINIVIL,ZESTRIL) 5 MG tablet Take 5 mg by mouth daily.        Marland Kitchen LORazepam (ATIVAN) 0.5 MG tablet Take 1 tablet (0.5 mg total) by mouth 2 (two) times daily as needed.  60 tablet  1  . metformin (FORTAMET) 1000 MG (OSM) 24 hr tablet Take 1,000 mg by mouth 2 (two) times daily with a meal.       . Multiple Vitamins-Minerals (CENTRUM SILVER PO) 1 tab po qd       . niacin (NIASPAN) 1000 MG CR tablet Take 1,000 mg by mouth at bedtime.        . ramelteon (ROZEREM) 8 MG tablet Take 8 mg by mouth at bedtime.        . rosuvastatin (CRESTOR) 20 MG tablet Take 20 mg by mouth daily.        . sertraline (ZOLOFT) 100 MG tablet Take 0.5 tablets (50 mg total) by mouth daily. Begin after finishing celexa  90 tablet  3  . tadalafil (CIALIS) 20 MG tablet 1/2 - 1 po prior to intercourse  10 tablet  0  . vitamin B-12 (CYANOCOBALAMIN) 1000 MCG tablet Take 1,000 mcg by mouth daily.          Allergies  Allergen Reactions  . Codeine Phosphate     REACTION: nausea and vomiting  ---- not all codeine products  . Penicillins     REACTION: unspecified    Past Medical History  Diagnosis Date  . CAD (coronary artery disease)   . Myocardial infarction   . HTN (hypertension)   . Hyperlipidemia   . Obesity   . Diabetes mellitus   .  Fatigue   . OSA (obstructive sleep apnea)   . Persistent disorder of initiating or maintaining sleep   . Erectile dysfunction   . Nephrolithiasis   . Diverticulosis     Past Surgical History  Procedure Date  . Coronary stent placement     stenting of the right coronary artery with followup rotational  atherectomy.   . Cholecystectomy   . Colonoscopy   . Coronary angioplasty   . Orthopedic surgery     Family History  Problem Relation Age of Onset  . Heart disease      History   Social History  . Marital Status: Married    Spouse Name: N/A    Number of Children: N/A  . Years of Education: N/A   Occupational History  . Not on file.   Social History Main Topics  . Smoking status: Former Smoker -- 1.5 packs/day for 30 years    Quit  date: 09/19/1980  . Smokeless tobacco: Not on file  . Alcohol Use: Yes     social  . Drug Use: No  . Sexually Active: Not on file   Other Topics Concern  . Not on file   Social History Narrative  . No narrative on file    ROS: Please see the HPI.  All other systems reviewed and negative.  PHYSICAL EXAM:  BP 131/72  Pulse 91  Ht 6' (1.829 m)  Wt 217 lb (98.431 kg)  BMI 29.43 kg/m2  General: Well developed, well nourished, in no acute distress. Head:  Normocephalic and atraumatic. Neck: no JVD Lungs: Clear to auscultation and percussion. Heart: Normal S1 and S2.  No murmur, rubs or gallops.  Abdomen:  Normal bowel sounds; soft; non tender; no organomegaly Pulses: Pulses normal in all 4 extremities. Extremities: No clubbing or cyanosis. No edema. Neurologic: Alert and oriented x 3.  EKG:  NSR.  RIghtward axis.  Nonspecific T abnormality.    ASSESSMENT AND PLAN:

## 2011-04-27 NOTE — Assessment & Plan Note (Signed)
He continues to remain stable with aggressive medical therapy, mainly lipid reduction.  No new symtoms.

## 2011-04-27 NOTE — Assessment & Plan Note (Signed)
He is stable, but understands trigylcerides are elevated.  He understands weight loss will help that, as will DM control.

## 2011-04-27 NOTE — Patient Instructions (Signed)
Your physician recommends that you schedule a follow-up appointment in: 4 months  

## 2011-04-27 NOTE — Assessment & Plan Note (Signed)
Controlled at the present time.  

## 2011-05-25 LAB — HEMOGLOBIN A1C: A1c: 10.1

## 2011-05-27 ENCOUNTER — Ambulatory Visit: Payer: Medicare Other | Admitting: Internal Medicine

## 2011-06-03 ENCOUNTER — Other Ambulatory Visit: Payer: Self-pay | Admitting: *Deleted

## 2011-06-03 MED ORDER — LORAZEPAM 0.5 MG PO TABS: 0.5000 mg | ORAL_TABLET | Freq: Two times a day (BID) | ORAL | Status: DC | PRN

## 2011-06-13 ENCOUNTER — Ambulatory Visit (INDEPENDENT_AMBULATORY_CARE_PROVIDER_SITE_OTHER): Payer: Medicare Other | Admitting: Internal Medicine

## 2011-06-13 ENCOUNTER — Encounter: Payer: Self-pay | Admitting: Internal Medicine

## 2011-06-13 VITALS — BP 148/78 | HR 96 | Temp 98.3°F | Ht 72.0 in | Wt 224.0 lb

## 2011-06-13 DIAGNOSIS — G4733 Obstructive sleep apnea (adult) (pediatric): Secondary | ICD-10-CM

## 2011-06-13 DIAGNOSIS — E119 Type 2 diabetes mellitus without complications: Secondary | ICD-10-CM

## 2011-06-13 DIAGNOSIS — E785 Hyperlipidemia, unspecified: Secondary | ICD-10-CM

## 2011-06-13 DIAGNOSIS — Z23 Encounter for immunization: Secondary | ICD-10-CM

## 2011-06-13 MED ORDER — INSULIN GLARGINE 100 UNIT/ML ~~LOC~~ SOLN: 55.0000 [IU] | Freq: Every day | SUBCUTANEOUS | Status: DC

## 2011-06-13 NOTE — Patient Instructions (Addendum)
Call your insurance company and see if they will cover shingles vaccine. If they will, call us and we will give it to you---[facility] will probably give it to you  [facility] with [facility] in regards to lantus dosing.

## 2011-06-13 NOTE — Progress Notes (Signed)
  Subjective:    Patient ID: Brent [name]., male    DOB: [birth year], 65 y.o.   MRN: [id number]  HPI  patient comes in for followup of multiple medical problems including type 2 diabetes, hyperlipidemia, hypertension. The patient does not check blood sugar or blood pressure at home. The patetient does not follow an exercise or diet program. The patient denies any polyuria, polydipsia.  In the past the patient has gone to diabetic treatment center. The patient is tolerating medications  Without difficulty. The patient does admit to medication compliance.   Past Medical History  Diagnosis Date  . CAD (coronary artery disease)   . Myocardial infarction   . HTN (hypertension)   . Hyperlipidemia   . Obesity   . Diabetes mellitus   . Fatigue   . OSA (obstructive sleep apnea)   . Persistent disorder of initiating or maintaining sleep   . Erectile dysfunction   . Nephrolithiasis   . Diverticulosis    Past Surgical History  Procedure Date  . Coronary stent placement     stenting of the right coronary artery with followup rotational  atherectomy.   . Cholecystectomy   . Colonoscopy   . Coronary angioplasty   . Orthopedic surgery     reports that he quit smoking about 30 years ago. He does not have any smokeless tobacco history on file. He reports that he drinks alcohol. He reports that he does not use illicit drugs. family history includes Heart disease in an unspecified family member. Allergies  Allergen Reactions  . Codeine Phosphate     REACTION: nausea and vomiting  ---- not all codeine products  . Penicillins     REACTION: unspecified      Review of Systems  patient denies chest pain, shortness of breath, orthopnea. Denies lower extremity edema, abdominal pain, change in appetite, change in bowel movements. Patient denies rashes, musculoskeletal complaints. No other specific complaints in a complete review of systems.      Objective:   Physical Exam  well-developed  well-nourished male in no acute distress. HEENT exam atraumatic, normocephalic, neck supple without jugular venous distention. Chest clear to auscultation cardiac exam S1-S2 are regular. Abdominal exam overweight with bowel sounds, soft and nontender. Extremities no edema. Neurologic exam is alert with a normal gait.        Assessment & Plan:

## 2011-06-15 ENCOUNTER — Encounter: Payer: Self-pay | Admitting: Internal Medicine

## 2011-06-20 NOTE — Assessment & Plan Note (Signed)
Well controlled. Continue current medications  

## 2011-06-20 NOTE — Assessment & Plan Note (Signed)
Patient tolerating CPAP.

## 2011-06-20 NOTE — Assessment & Plan Note (Signed)
Poor control. Discussed need for aggressive weight loss. Continue current medications.

## 2011-06-30 ENCOUNTER — Encounter: Payer: Self-pay | Admitting: Internal Medicine

## 2011-07-08 ENCOUNTER — Telehealth: Payer: Self-pay | Admitting: Pulmonary Disease

## 2011-07-08 MED ORDER — ZOLPIDEM TARTRATE 10 MG PO TABS: 10.0000 mg | ORAL_TABLET | Freq: Every evening | ORAL | Status: DC | PRN

## 2011-07-08 NOTE — Telephone Encounter (Signed)
Pt c/o not sleeping well, having problems going to sleep and staying sleep x 2 weeks. Pt has been on Rozerem 8mg  every night at least 2 years. Has tried Ambien 10mg  in the past and resulted with the current symptoms. I informed pt that [facility] is out of the office x 1 week and will forward to one of his sleep colleagues. Dr. Maple Hudson please advise on an alternate until [facility] returns. Thanks.  Allergies  Allergen Reactions  . Codeine Phosphate     REACTION: nausea and vomiting  ---- not all codeine products  . Penicillins     REACTION: unspecified    [facility]

## 2011-07-08 NOTE — Telephone Encounter (Signed)
Per CDY-Ambien 10 mg #20 1 qhs then discuss with Dr. Shelle Iron on return.-I called and informed pt of same, and [facility]. Also advised pt if no relief to give [place] a call back.

## 2011-07-13 NOTE — Telephone Encounter (Signed)
[email or web address]  

## 2011-07-13 NOTE — Telephone Encounter (Signed)
Pt called back.  Informed him of KC's recs.  Pt agreed to come in to be seen.  Pt scheduled for 07/22/11 at 3:45 pm

## 2011-07-13 NOTE — Telephone Encounter (Signed)
Please let pt know that I do not prescribe ambien for chronic insomnia, and he will need OV to discuss different treatment if the rozerem doesn't work for him.

## 2011-07-22 ENCOUNTER — Ambulatory Visit (INDEPENDENT_AMBULATORY_CARE_PROVIDER_SITE_OTHER): Payer: Medicare Other | Admitting: Pulmonary Disease

## 2011-07-22 ENCOUNTER — Encounter: Payer: Self-pay | Admitting: Gastroenterology

## 2011-07-22 ENCOUNTER — Encounter: Payer: Self-pay | Admitting: Pulmonary Disease

## 2011-07-22 DIAGNOSIS — G4700 Insomnia, unspecified: Secondary | ICD-10-CM

## 2011-07-22 DIAGNOSIS — G4733 Obstructive sleep apnea (adult) (pediatric): Secondary | ICD-10-CM

## 2011-07-22 MED ORDER — TRAZODONE HCL 50 MG PO TABS: 50.0000 mg | ORAL_TABLET | Freq: Every day | ORAL | Status: DC

## 2011-07-22 NOTE — Assessment & Plan Note (Signed)
The patient has had increased issues with sleep onset insomnia, and feels the rozeram no longer works for him.  He has started Ambien short-term, and feels this does work.  However, I have explained the issues with tolerance and the addiction potential.  I have also explained behavioral therapies are the best treatment for her insomnia.  I have outlined stimulus control therapy, and I am willing to give him a short course of trazodone at bedtime to see if it will help with "breaking the cycle".

## 2011-07-22 NOTE — Patient Instructions (Signed)
Trial of trazodone 50mg  about one hour before bedtime.  Stop rozerem and ambien Do not stay in bed more than if you cannot initiate sleep.  Do this as many times as it takes until you fall asleep.  Start your day no later than 8am, and NO napping during the day.   No reading or watching tv in bed, no computer after 10pm.  Continue with cpap, work on weight loss followup with me in one year, but call in 3 weeks to give me update on your progress.

## 2011-07-22 NOTE — Progress Notes (Signed)
  Subjective:    Patient ID: Brent Loh., male    DOB: 1945/11/17, 66 y.o.   MRN: [id number]  HPI Patient comes in today for followup of his known obstructive sleep apnea.  He is wearing CPAP compliantly, and is having no issues with his mask fit or pressure.  He is rested in the mornings upon arising, and denies significant daytime sleepiness.  He is having increasing issues with sleep onset insomnia, and feels the rozeram is no longer working.  He is asking for continuation of Ambien, but I have told him this is not a good medication to take long-term.  The patient is watching TV and reading in bed before sleep onset, and is cat napping at times during the day.   Review of Systems  Constitutional: Positive for diaphoresis. Negative for fever and unexpected weight change.  HENT: Positive for sore throat. Negative for ear pain, nosebleeds, congestion, rhinorrhea, sneezing, trouble swallowing, dental problem, postnasal drip and sinus pressure.   Eyes: Negative for redness and itching.  Respiratory: Negative for cough, chest tightness, shortness of breath and wheezing.   Cardiovascular: Negative for palpitations and leg swelling.  Gastrointestinal: Negative for nausea and vomiting.  Genitourinary: Negative for dysuria.  Musculoskeletal: Positive for joint swelling.  Skin: Negative for rash.  Neurological: Positive for headaches.  Hematological: Bruises/bleeds easily.  Psychiatric/Behavioral: Negative for dysphoric mood. The patient is not nervous/anxious.        Objective:   Physical Exam Obese male in no acute distress No skin breakdown or pressure necrosis from the CPAP mask Nose without purulence or discharge noted Chest clear to auscultation Extremities without edema, no cyanosis noted Alert, does not appear to be sleepy, moves all 4 extremities.       Assessment & Plan:

## 2011-07-22 NOTE — Assessment & Plan Note (Signed)
The patient is doing very well with CPAP, and denies any issues with mask or pressure tolerance.  I have asked him to work aggressively on weight reduction.

## 2011-08-01 ENCOUNTER — Telehealth: Payer: Self-pay | Admitting: *Deleted

## 2011-08-01 NOTE — Telephone Encounter (Signed)
Low grade fever only.  No SOB.  Notified. Pt.

## 2011-08-01 NOTE — Telephone Encounter (Signed)
Hycodan---1 tsp tid prn cough-- 120cc/0refills.  OV for fever or SOB

## 2011-08-01 NOTE — Telephone Encounter (Signed)
Pt is asking if Dr Cato Mulligan would call in a cough RX x 3 days and and feels terrible.  Mucinex has not helped at all.

## 2011-08-15 ENCOUNTER — Telehealth: Payer: Self-pay | Admitting: *Deleted

## 2011-08-15 NOTE — Telephone Encounter (Signed)
Dr Jarold Motto:  Pt is scheduled for PV 11//27, Tuesday for recall colonoscopy.  Pt is 64 with a hx of adenomatous polyps.  Last colonoscopy 2007.  Procedure report says recall colonoscopy in 5 years (2012).  Recall assessment sheet says "N0" to recall.  Could you review chart  And advise as to whether or not this pt is due for recall colon now?  Thanks,  Ezra Sites

## 2011-08-16 ENCOUNTER — Ambulatory Visit (AMBULATORY_SURGERY_CENTER): Payer: Medicare Other

## 2011-08-16 ENCOUNTER — Encounter: Payer: Self-pay | Admitting: Gastroenterology

## 2011-08-16 VITALS — Ht 72.0 in | Wt 226.3 lb

## 2011-08-16 DIAGNOSIS — Z86010 Personal history of colonic polyps: Secondary | ICD-10-CM

## 2011-08-16 MED ORDER — PEG-KCL-NACL-NASULF-NA ASC-C 100 G PO SOLR: 1.0000 | Freq: Once | ORAL | Status: AC

## 2011-08-18 ENCOUNTER — Encounter: Payer: Self-pay | Admitting: *Deleted

## 2011-08-18 NOTE — Telephone Encounter (Signed)
Spoke with pt to inform him he needs an OV before we can schedule his Recall Colon; pt stated understanding and will come in tomorrow.

## 2011-08-18 NOTE — Telephone Encounter (Signed)
Needs ov first

## 2011-08-18 NOTE — Telephone Encounter (Signed)
lmom for pt to call back to schedule OV.

## 2011-08-19 ENCOUNTER — Encounter: Payer: Self-pay | Admitting: Gastroenterology

## 2011-08-19 ENCOUNTER — Ambulatory Visit (INDEPENDENT_AMBULATORY_CARE_PROVIDER_SITE_OTHER): Payer: Medicare Other | Admitting: Gastroenterology

## 2011-08-19 ENCOUNTER — Encounter: Payer: Self-pay | Admitting: *Deleted

## 2011-08-19 VITALS — BP 138/70 | HR 96 | Ht 72.0 in | Wt 225.8 lb

## 2011-08-19 DIAGNOSIS — Z860100 Personal history of colon polyps, unspecified: Secondary | ICD-10-CM | POA: Insufficient documentation

## 2011-08-19 DIAGNOSIS — K5730 Diverticulosis of large intestine without perforation or abscess without bleeding: Secondary | ICD-10-CM

## 2011-08-19 DIAGNOSIS — E119 Type 2 diabetes mellitus without complications: Secondary | ICD-10-CM

## 2011-08-19 DIAGNOSIS — I2510 Atherosclerotic heart disease of native coronary artery without angina pectoris: Secondary | ICD-10-CM

## 2011-08-19 NOTE — Patient Instructions (Signed)
Keep colonoscopy appt on 08/29/2011

## 2011-08-19 NOTE — Progress Notes (Signed)
History of Present Illness:  This is a very nice 65 year old Caucasian male with multiple medical problems including coronary artery disease with previous stenting, adult onset insulin-dependent diabetes, hyperlipidemia, cholelithiasis with surgically removed common bile duct stones, chronic depression, B12 deficiency, well-controlled hypertension. He had colonoscopy in May of 2004 which revealed diverticulosis and some benign colon polyps. He has not had followup exams since that time. He currently is asymptomatic in terms of GI complaints without direct or do, melena, hematochezia, or upper GI or hepatobiliary complaints. He is on aspirin 325 mg a day and when necessary Ativan 1 mg, Zoloft 100 mg at bedtime. He also takes trazodone 50 mg before bed. Obviously has chronic depression which appears to be under fairly good control. Hemoglobin A1c has been 9-10, but he denies complications from his diabetes.  I have reviewed this patient's present history, medical and surgical past history, allergies and medications.     ROS: The remainder of the 10 point ROS is negative     Physical Exam: Awake and alert no acute distress. I cannot appreciate stigmata of chronic liver disease. Chest is clear he appears a bit irregular rhythm with a soft systolic ejection murmur but no S3 gallop. His abdomen is obese without definite organomegaly, masses or tenderness. Bowel sounds are nonobstructive. There is no peripheral edema, phlebitis, or swollen joints. Mental status is normal.  Assessment and plan: Patient definitely needs followup colonoscopy per his previous history of what appears to be a significant adenoma. We have explained to him the risk and benefits of colonoscopy, and we'll proceed as planned with adjustment in his diabetic medications appropriate for his balanced electrolytes colonoscopy preparation. His continue all other medications as listed and reviewed per Dr. Cato Mulligan and Riley Kill...  Encounter  Diagnoses  Name Primary?  . Personal history of colonic polyps Yes  . Diverticulosis of colon (without mention of hemorrhage)   . DM (diabetes mellitus)   . CAD (coronary artery disease)

## 2011-08-23 ENCOUNTER — Encounter: Payer: Self-pay | Admitting: Cardiology

## 2011-08-23 ENCOUNTER — Ambulatory Visit (INDEPENDENT_AMBULATORY_CARE_PROVIDER_SITE_OTHER): Payer: Medicare Other | Admitting: Cardiology

## 2011-08-23 DIAGNOSIS — I2510 Atherosclerotic heart disease of native coronary artery without angina pectoris: Secondary | ICD-10-CM

## 2011-08-23 DIAGNOSIS — E785 Hyperlipidemia, unspecified: Secondary | ICD-10-CM

## 2011-08-23 NOTE — Assessment & Plan Note (Signed)
Remains on aggressive medical therapy.  See meds.  LDL Direct 76

## 2011-08-23 NOTE — Assessment & Plan Note (Signed)
Continues to remain stable.  I discussed ASA dosing with him today and suggested he consider reducing the dose to 81mg .  I went through the reasoning.  He will consider it.

## 2011-08-23 NOTE — Progress Notes (Signed)
HPI:  About the same.  Long discussion regarding the statins, and their benefits.  He is doing pretty well with nor recurrent chest pain.  He is getting ready to see a diabetic MD at the [facility].  He will rediscuss statins with them as well.    Current Outpatient Prescriptions  Medication Sig Dispense Refill  . aspirin 325 MG tablet Take 325 mg by mouth daily.        . fenofibrate (TRICOR) 145 MG tablet Take 145 mg by mouth daily. Take 160 mg daily      . fish oil-omega-3 fatty acids 1000 MG capsule Take 2 g by mouth 2 (two) times daily.       Marland Kitchen FLAXSEED, LINSEED, PO Take 1 tablet by mouth daily. 1 tab po bid      . fluticasone (FLONASE) 50 MCG/ACT nasal spray Spray twice up each nostril as needed      . insulin glargine (LANTUS) 100 UNIT/ML injection Inject 55 Units into the skin at bedtime.  10 mL    . lisinopril (PRINIVIL,ZESTRIL) 5 MG tablet Take 5 mg by mouth daily.        Marland Kitchen LORazepam (ATIVAN) 0.5 MG tablet Take 1 mg by mouth daily.        . metformin (FORTAMET) 1000 MG (OSM) 24 hr tablet Take 1,000 mg by mouth 2 (two) times daily with a meal.       . Multiple Vitamins-Minerals (CENTRUM SILVER PO) 1 tab po qd       . niacin (NIASPAN) 1000 MG CR tablet Take 500 mg by mouth at bedtime.       . rosuvastatin (CRESTOR) 20 MG tablet Take 20 mg by mouth daily.        . sertraline (ZOLOFT) 100 MG tablet Take 50 mg by mouth daily. Begin after finishing celexa      . tadalafil (CIALIS) 20 MG tablet 1/2 - 1 po prior to intercourse  10 tablet  0  . vitamin B-12 (CYANOCOBALAMIN) 1000 MCG tablet Take 1,000 mcg by mouth daily.          Allergies  Allergen Reactions  . Codeine Phosphate     REACTION: nausea and vomiting  ---- not all codeine products  . Penicillins     REACTION: unspecified    Past Medical History  Diagnosis Date  . CAD (coronary artery disease)   . Myocardial infarction   . HTN (hypertension)   . Hyperlipidemia   . Obesity   . Diabetes mellitus   . Fatigue   . OSA  (obstructive sleep apnea)   . Persistent disorder of initiating or maintaining sleep   . Erectile dysfunction   . Nephrolithiasis   . Diverticulosis   . Hemorrhoids   . Personal history of colonic polyps 02/05/2003    Past Surgical History  Procedure Date  . Coronary stent placement     stenting of the right coronary artery with followup rotational  atherectomy.   . Cholecystectomy   . Coronary angioplasty   . Orthopedic surgery   . Rotator cuff surg     Bil  . Inguinal hernia repair     right  . Foot surgery     right  . Finger surgery     right    Family History  Problem Relation Age of Onset  . Liver cancer Father   . Heart disease Sister   . Lung cancer Mother   . Lung cancer Father  History   Social History  . Marital Status: Married    Spouse Name: N/A    Number of Children: N/A  . Years of Education: N/A   Occupational History  . Not on file.   Social History Main Topics  . Smoking status: Former Smoker -- 1.5 packs/day for 30 years    Types: Cigarettes    Quit date: 09/19/1980  . Smokeless tobacco: Never Used  . Alcohol Use: 0.5 oz/week    1 drink(s) per week     social  . Drug Use: No  . Sexually Active: Not on file   Other Topics Concern  . Not on file   Social History Narrative  . No narrative on file    ROS: Please see the HPI.  All other systems reviewed and negative.  PHYSICAL EXAM:  BP 128/66  Pulse 82  Ht 6' (1.829 m)  Wt 101.515 kg (223 lb 12.8 oz)  BMI 30.35 kg/m2  General: Well developed, well nourished, in no acute distress. Head:  Normocephalic and atraumatic. Neck: no JVD Lungs: Clear to auscultation and percussion. Heart: Normal S1 and S2.  No murmur, rubs or gallops.  Abdomen:  Normal bowel sounds; soft; non tender; no organomegaly Pulses: Pulses normal in all 4 extremities. Extremities: No clubbing or cyanosis. No edema. Neurologic: Alert and oriented x 3.  EKG:  NSR. WNL ASSESSMENT AND PLAN:s

## 2011-08-23 NOTE — Patient Instructions (Signed)
Your physician recommends that you continue on your current medications as directed. Please refer to the Current Medication list given to you today.  Your physician wants you to follow-up in: 6 MONTHS. You will receive a reminder letter in the mail two months in advance. If you don't receive a letter, please call our office to schedule the follow-up appointment.  

## 2011-08-23 NOTE — Progress Notes (Signed)
Patient ID: Brent Helwig., male   DOB: June 04, 1946, 65 y.o.   MRN: [id number]

## 2011-08-29 ENCOUNTER — Encounter: Payer: Self-pay | Admitting: Gastroenterology

## 2011-08-29 ENCOUNTER — Ambulatory Visit (AMBULATORY_SURGERY_CENTER): Payer: Medicare Other | Admitting: Gastroenterology

## 2011-08-29 VITALS — BP 147/82 | HR 74 | Temp 96.3°F | Resp 15 | Ht 72.0 in | Wt 223.0 lb

## 2011-08-29 DIAGNOSIS — Z86010 Personal history of colonic polyps: Secondary | ICD-10-CM

## 2011-08-29 DIAGNOSIS — D126 Benign neoplasm of colon, unspecified: Secondary | ICD-10-CM

## 2011-08-29 DIAGNOSIS — K5730 Diverticulosis of large intestine without perforation or abscess without bleeding: Secondary | ICD-10-CM

## 2011-08-29 DIAGNOSIS — Z1211 Encounter for screening for malignant neoplasm of colon: Secondary | ICD-10-CM

## 2011-08-29 LAB — GLUCOSE, CAPILLARY: Glucose-Capillary: 192 mg/dL — ABNORMAL HIGH (ref 70–99)

## 2011-08-29 MED ORDER — SODIUM CHLORIDE 0.9 % IV SOLN: 500.0000 mL | INTRAVENOUS | Status: DC

## 2011-08-29 NOTE — Op Note (Signed)
[name] [address] [place], [place]  [address]  COLONOSCOPY PROCEDURE REPORT  PATIENT:  Brent [name], Brent [name]  MR#:  [id number] BIRTHDATE:  1946-01-09, 64 yrs. old  GENDER:  male ENDOSCOPIST:  Vania Rea. Jarold Motto, MD, [facility] REF. BY: PROCEDURE DATE:  08/29/2011 PROCEDURE:  Colonoscopy with biopsy ASA CLASS:  Class III INDICATIONS:  history of polyps MEDICATIONS:   Fentanyl 75 mcg IV, Versed 7 mg IV, These medications were titrated to patient response per physician's verbal order  DESCRIPTION OF PROCEDURE:   After the risks and benefits and of the procedure were explained, informed consent was obtained. Digital rectal exam was performed and revealed no abnormalities. The LB [id number] endoscope was introduced through the anus and advanced to the cecum, which was identified by both the appendix and ileocecal valve.  The quality of the prep was excellent, using MoviPrep.  The instrument was then slowly withdrawn as the colon was fully examined. <<PROCEDUREIMAGES>>  FINDINGS:  Severe diverticulosis was found in the left colon.  A sessile polyp was found. 3 MM SIGMOID POLYP COLD SNARE EXCISED. Retroflexed views in the rectum revealed no abnormalities.    The scope was then withdrawn from the patient and the procedure completed.  COMPLICATIONS:  None ENDOSCOPIC IMPRESSION: 1) Severe diverticulosis in the left colon 2) Sessile polyp R/O ADENOMA. RECOMMENDATIONS: 1) Await biopsy results 2) Repeat colonoscopy in 5 years if polyp adenomatous; otherwise 10 years 3) High fiber diet. RESUME ALL MEDS  REPEAT EXAM:  No  ______________________________ Vania Rea. Jarold Motto, MD, Clement[facility] Graham  CC:  Lindley Magnus, MD  n. Rosalie DoctorVania Rea. Patterson at 08/29/2011 10:56 AM  Carlus Pavlov, [id number]

## 2011-08-29 NOTE — Progress Notes (Signed)
Patient did not experience any of the following events: a burn prior to discharge; a fall within the facility; wrong site/side/patient/procedure/implant event; or a hospital transfer or hospital admission upon discharge from the facility. (G8907) Patient did not have preoperative order for IV antibiotic SSI prophylaxis. (G8918)  

## 2011-08-29 NOTE — Progress Notes (Signed)
Pt had cramping with the scope advancement.  Medications were titrated per Dr. Norval Gable orders.  Once the cecum was reached, the pt relaxed and rested comfortably with his eyes closed.  Maw

## 2011-08-29 NOTE — Patient Instructions (Signed)
Discharge instructions given with verbal understanding.  Handouts on polyps and diverticulosis given.  Resume previous medications. 

## 2011-08-30 ENCOUNTER — Telehealth: Payer: Self-pay | Admitting: *Deleted

## 2011-08-30 NOTE — Telephone Encounter (Signed)
No answer, message left

## 2011-09-05 ENCOUNTER — Other Ambulatory Visit: Payer: Self-pay | Admitting: Pulmonary Disease

## 2011-09-05 ENCOUNTER — Other Ambulatory Visit: Payer: Self-pay | Admitting: *Deleted

## 2011-09-05 ENCOUNTER — Encounter: Payer: Self-pay | Admitting: Gastroenterology

## 2011-09-05 MED ORDER — LORAZEPAM 0.5 MG PO TABS: 1.0000 mg | ORAL_TABLET | Freq: Every day | ORAL | Status: DC

## 2011-09-07 ENCOUNTER — Telehealth: Payer: Self-pay | Admitting: Internal Medicine

## 2011-09-07 NOTE — Telephone Encounter (Signed)
Pt aware.

## 2011-09-07 NOTE — Telephone Encounter (Signed)
Robitussin DM bid cause he is allergic to codiene

## 2011-09-07 NOTE — Telephone Encounter (Signed)
Pt requesting rx to be sent in for Hydrocodone cough syrup to [facility]. Pt requesting you contact him as well

## 2011-10-08 ENCOUNTER — Emergency Department (INDEPENDENT_AMBULATORY_CARE_PROVIDER_SITE_OTHER)
Admission: EM | Admit: 2011-10-08 | Discharge: 2011-10-08 | Disposition: A | Discharge status: Home / Self Care | Payer: Medicare Other | Source: Home / Self Care

## 2011-10-08 ENCOUNTER — Emergency Department (INDEPENDENT_AMBULATORY_CARE_PROVIDER_SITE_OTHER): Payer: Medicare Other

## 2011-10-08 ENCOUNTER — Encounter (HOSPITAL_COMMUNITY): Payer: Self-pay | Admitting: *Deleted

## 2011-10-08 DIAGNOSIS — S52501A Unspecified fracture of the lower end of right radius, initial encounter for closed fracture: Secondary | ICD-10-CM

## 2011-10-08 DIAGNOSIS — M25539 Pain in unspecified wrist: Secondary | ICD-10-CM | POA: Diagnosis not present

## 2011-10-08 DIAGNOSIS — M7989 Other specified soft tissue disorders: Secondary | ICD-10-CM | POA: Diagnosis not present

## 2011-10-08 DIAGNOSIS — S52599A Other fractures of lower end of unspecified radius, initial encounter for closed fracture: Secondary | ICD-10-CM

## 2011-10-08 MED ORDER — IBUPROFEN 800 MG PO TABS
ORAL_TABLET | ORAL | Status: AC
  Filled 2011-10-08: qty 1

## 2011-10-08 MED ORDER — PERCOCET 5-325 MG PO TABS: 1.0000 | ORAL_TABLET | Freq: Four times a day (QID) | ORAL | Status: AC | PRN

## 2011-10-08 MED ORDER — IBUPROFEN 800 MG PO TABS
800.0000 mg | ORAL_TABLET | Freq: Once | ORAL | Status: AC
  Administered 2011-10-08: 800 mg via ORAL

## 2011-10-08 NOTE — ED Notes (Signed)
Ortho tech is on the way. 

## 2011-10-08 NOTE — ED Provider Notes (Signed)
Medical screening examination/treatment/procedure(s) were performed by non-physician practitioner and as supervising physician I was immediately available for consultation/collaboration.  Corrie Mckusick, MD 10/08/11 2139

## 2011-10-08 NOTE — ED Notes (Signed)
Pt slipped on ice this am fell injured right wrist - swelling severe pain with movement

## 2011-10-08 NOTE — ED Provider Notes (Signed)
History     CSN: [id number]  Arrival date & time 10/08/11  1047   None     Chief Complaint  Patient presents with  . Fall  . Wrist Pain    (Consider location/radiation/quality/duration/timing/severity/associated sxs/prior treatment) HPI Comments: The patient reports he was walking around his car at 9:30am today and slipped on a patch of ice.  He fell, putting his hands out to catch him.  Reports pain in his right wrist with decreased sensation and tingling in his right hand.  States he thinks his lowered his head to the ground because it was wet but did not hit his head hard, denies LOC or head pain.  Denies other injury.    Patient is a 66 y.o. male presenting with fall and wrist pain. The history is provided by the patient.  Fall  Wrist Pain    Past Medical History  Diagnosis Date  . CAD (coronary artery disease)   . Myocardial infarction   . HTN (hypertension)   . Hyperlipidemia   . Obesity   . Diabetes mellitus   . Fatigue   . OSA (obstructive sleep apnea)   . Persistent disorder of initiating or maintaining sleep   . Erectile dysfunction   . Nephrolithiasis   . Diverticulosis   . Hemorrhoids   . Personal history of colonic polyps 02/05/2003    Past Surgical History  Procedure Date  . Coronary stent placement     stenting of the right coronary artery with followup rotational  atherectomy.   . Cholecystectomy   . Coronary angioplasty   . Orthopedic surgery   . Rotator cuff surg     Bil  . Inguinal hernia repair     right  . Foot surgery     right  . Finger surgery     right    Family History  Problem Relation Age of Onset  . Liver cancer Father   . Heart disease Sister   . Lung cancer Mother   . Lung cancer Father     History  Substance Use Topics  . Smoking status: Former Smoker -- 1.5 packs/day for 30 years    Types: Cigarettes    Quit date: 09/19/1980  . Smokeless tobacco: Never Used  . Alcohol Use: 0.5 oz/week    1 drink(s) per week     social      Review of Systems  All other systems reviewed and are negative.    Allergies  Codeine phosphate and Penicillins  Home Medications   Current Outpatient Rx  Name Route Sig Dispense Refill  . ASPIRIN 325 MG PO TABS Oral Take 325 mg by mouth daily.      . FENOFIBRATE 145 MG PO TABS Oral Take 145 mg by mouth daily. Take 160 mg daily    . OMEGA-3 FATTY ACIDS 1000 MG PO CAPS Oral Take 2 g by mouth 2 (two) times daily.     Marland Kitchen FLAXSEED (LINSEED) PO Oral Take 1 tablet by mouth daily. 1 tab po bid    . FLUTICASONE PROPIONATE 50 MCG/ACT NA SUSP  2 SPRAYS EACH NOSTRIL ONCE DAILY 16 g 10  . INSULIN DETEMIR [place] Subcutaneous Inject 60 Units into the skin once.     Marland Kitchen LISINOPRIL 5 MG PO TABS Oral Take 5 mg by mouth daily.      Marland Kitchen LORAZEPAM 0.5 MG PO TABS Oral Take 2 tablets (1 mg total) by mouth daily. 30 tablet 1  . METFORMIN HCL ER (OSM) 1000  MG PO TB24 Oral Take 1,000 mg by mouth 2 (two) times daily with a meal.     . CENTRUM SILVER PO  1 tab po qd     . NIACIN ER (ANTIHYPERLIPIDEMIC) 1000 MG PO TBCR Oral Take 500 mg by mouth at bedtime.     Marland Kitchen ROSUVASTATIN CALCIUM 20 MG PO TABS Oral Take 20 mg by mouth daily.      . SERTRALINE HCL 100 MG PO TABS Oral Take 50 mg by mouth daily. Begin after finishing celexa    . TADALAFIL 20 MG PO TABS  1/2 - 1 po prior to intercourse 10 tablet 0  . TRAZODONE HCL 50 MG PO TABS      . VITAMIN B-12 1000 MCG PO TABS Oral Take 1,000 mcg by mouth daily.      Marland Kitchen ZOLPIDEM TARTRATE 10 MG PO TABS        BP 136/64  Pulse 86  Temp(Src) 98.7 F (37.1 C) (Oral)  Resp 20  SpO2 98%  Physical Exam  Nursing note and vitals reviewed. Constitutional: He is oriented to person, place, and time. He appears well-developed and well-nourished.  HENT:  Head: Normocephalic and atraumatic.  Neck: Neck supple.  Pulmonary/Chest: Effort normal.  Musculoskeletal:       Right shoulder: Normal.       Right elbow: Normal.      Right wrist: He exhibits decreased range of  motion, tenderness, bony tenderness and swelling. He exhibits no crepitus.       Left wrist: Normal.       Right knee: Normal.       Cervical back: Normal. He exhibits no tenderness.       Thoracic back: Normal. He exhibits no tenderness.       Lumbar back: Normal. He exhibits no tenderness.       Arms:      Left hand: Normal.       Right hand with mild swelling of ulnar side.  Decreased sensation throughout hand - pt states it "feels swollen"  Change in sensation does not follow dermatomal distribution but is diffuse.  No apparent radial nerve impairment.  Radial pulse intact.  Patient moves all fingers.  Strength 5/5 in each finger.  Patient extends and flexes fingers completely. Capillary refill < 2 seconds throughout.   Neurological: He is alert and oriented to person, place, and time. He has normal strength. He exhibits normal muscle tone. Coordination normal. GCS eye subscore is 4. GCS verbal subscore is 5. GCS motor subscore is 6.  Psychiatric: He has a normal mood and affect. His behavior is normal.    ED Course  Procedures (including critical care time)  Labs Reviewed - No data to display Dg Wrist Complete Right  10/08/2011  *RADIOLOGY REPORT*  Clinical Data: Fall, wrist pain  RIGHT WRIST - COMPLETE 3+ VIEW  Comparison: None.  Findings: Minimally displaced distal radial fracture with intra- articular extension.  No additional fractures are seen.  Mild radial soft tissue swelling.  IMPRESSION: Minimally displaced distal radial fracture with intra-articular extension.  Original Report Authenticated By: Charline Bills, M.D.     1. Closed fracture of right distal radius       MDM  Patient with fall earlier today.  No head trauma.  Pain and swelling to radial aspect of wrist - xray showing minimally displaced distal radius fracture with intra-articular extension.  Doubt tendon or nerve injury.  Patient requested follow up with [facility] as he is  already a patient there.   Have given patient Dr Carlos Levering information for follow up.  Splint placed in urgent care, pain medication prescribed.          Rise Patience, [place] 10/08/11 1320

## 2011-10-10 DIAGNOSIS — S52599A Other fractures of lower end of unspecified radius, initial encounter for closed fracture: Secondary | ICD-10-CM | POA: Diagnosis not present

## 2011-10-24 DIAGNOSIS — S52599A Other fractures of lower end of unspecified radius, initial encounter for closed fracture: Secondary | ICD-10-CM | POA: Diagnosis not present

## 2011-10-28 DIAGNOSIS — N403 Nodular prostate with lower urinary tract symptoms: Secondary | ICD-10-CM | POA: Diagnosis not present

## 2011-11-02 DIAGNOSIS — N403 Nodular prostate with lower urinary tract symptoms: Secondary | ICD-10-CM | POA: Diagnosis not present

## 2011-11-02 DIAGNOSIS — N138 Other obstructive and reflux uropathy: Secondary | ICD-10-CM | POA: Diagnosis not present

## 2011-11-03 ENCOUNTER — Ambulatory Visit (INDEPENDENT_AMBULATORY_CARE_PROVIDER_SITE_OTHER): Payer: Medicare Other | Admitting: Family

## 2011-11-03 ENCOUNTER — Encounter: Payer: Self-pay | Admitting: Family

## 2011-11-03 VITALS — BP 146/80 | Temp 98.3°F | Wt 221.0 lb

## 2011-11-03 DIAGNOSIS — R1013 Epigastric pain: Secondary | ICD-10-CM | POA: Diagnosis not present

## 2011-11-03 DIAGNOSIS — R109 Unspecified abdominal pain: Secondary | ICD-10-CM | POA: Diagnosis not present

## 2011-11-03 LAB — BASIC METABOLIC PANEL
CO2: 22 mEq/L (ref 19–32)
Calcium: 9.3 mg/dL (ref 8.4–10.5)
Creatinine, Ser: 0.7 mg/dL (ref 0.4–1.5)
GFR: 113.86 mL/min (ref >60.00)

## 2011-11-03 LAB — CBC WITH DIFFERENTIAL/PLATELET
Basophils Absolute: 0 10*3/uL (ref 0.0–0.1)
Eosinophils Absolute: 0.1 10*3/uL (ref 0.0–0.7)
HCT: 39.4 % (ref 39.0–52.0)
Hemoglobin: 13.3 g/dL (ref 13.0–17.0)
Lymphs Abs: 1.7 10*3/uL (ref 0.7–4.0)
MCHC: 33.8 g/dL (ref 30.0–36.0)
Monocytes Absolute: 0.5 10*3/uL (ref 0.1–1.0)
Neutro Abs: 3.7 10*3/uL (ref 1.4–7.7)
RDW: 12.7 % (ref 11.5–14.6)

## 2011-11-03 MED ORDER — LORAZEPAM 1 MG PO TABS: 0.5000 mg | ORAL_TABLET | Freq: Two times a day (BID) | ORAL | Status: DC

## 2011-11-03 NOTE — Progress Notes (Signed)
Subjective:    Patient ID: Brent [name]., male    DOB: 04/15/1946, 66 y.o.   MRN: [id number]  HPI 66 y/o male WM, nonsmoker, patient of Dr. Lovell Sheehan is in with c/o abdominal pain that has been going for 5 days. The pain has remained constant and crampy but varying in intensity. Rates the pain 1-2/10 at current and 8-9/10 at its worst. It is pain is portion of the abdomen but at times is generalized. The pain is particularly worse with lying down. He's taken Pepto-Bismol with no relief and has also tried an enema just in case he was having constipation that also did not help. He denies any vomiting, diarrhea, constipation, blood in the stools, dark black stools. He has a past medical history of diverticula as identified on his colonoscopy from December 2012. He has never had a flare of diverticulitis. Patient denies any recent travel, no changes in foods.   Review of Systems  Constitutional: Negative.   HENT: Negative.   Respiratory: Negative.   Cardiovascular: Negative.   Gastrointestinal: Positive for abdominal pain. Negative for nausea, vomiting, diarrhea, constipation, blood in stool and rectal pain.       Epigastric pain  Genitourinary: Negative.   Musculoskeletal: Negative.   Neurological: Negative.   Hematological: Negative.   Psychiatric/Behavioral: Negative.    Past Medical History  Diagnosis Date  . CAD (coronary artery disease)   . Myocardial infarction   . HTN (hypertension)   . Hyperlipidemia   . Obesity   . Diabetes mellitus   . Fatigue   . OSA (obstructive sleep apnea)   . Persistent disorder of initiating or maintaining sleep   . Erectile dysfunction   . Nephrolithiasis   . Diverticulosis   . Hemorrhoids   . Personal history of colonic polyps 02/05/2003    History   Social History  . Marital Status: Married    Spouse Name: N/A    Number of Children: N/A  . Years of Education: N/A   Occupational History  . Not on file.   Social History Main Topics   . Smoking status: Former Smoker -- 1.5 packs/day for 30 years    Types: Cigarettes    Quit date: 09/19/1980  . Smokeless tobacco: Never Used  . Alcohol Use: 0.5 oz/week    1 drink(s) per week     social  . Drug Use: No  . Sexually Active: Not on file   Other Topics Concern  . Not on file   Social History Narrative  . No narrative on file    Past Surgical History  Procedure Date  . Coronary stent placement     stenting of the right coronary artery with followup rotational  atherectomy.   . Cholecystectomy   . Coronary angioplasty   . Orthopedic surgery   . Rotator cuff surg     Bil  . Inguinal hernia repair     right  . Foot surgery     right  . Finger surgery     right    Family History  Problem Relation Age of Onset  . Liver cancer Father   . Heart disease Sister   . Lung cancer Mother   . Lung cancer Father     Allergies  Allergen Reactions  . Penicillins     REACTION: unspecified    Current Outpatient Prescriptions on File Prior to Visit  Medication Sig Dispense Refill  . aspirin 325 MG tablet Take 325 mg by mouth daily.        Marland Kitchen  fenofibrate (TRICOR) 145 MG tablet Take 145 mg by mouth daily. Take 160 mg daily      . fish oil-omega-3 fatty acids 1000 MG capsule Take 2 g by mouth 2 (two) times daily.       Marland Kitchen FLAXSEED, LINSEED, PO Take 1 tablet by mouth daily. 1 tab po bid      . fluticasone (FLONASE) 50 MCG/ACT nasal spray 2 SPRAYS EACH NOSTRIL ONCE DAILY  16 g  10  . INSULIN DETEMIR [place] Inject 60 Units into the skin once.       Marland Kitchen lisinopril (PRINIVIL,ZESTRIL) 5 MG tablet Take 5 mg by mouth daily.        . metformin (FORTAMET) 1000 MG (OSM) 24 hr tablet Take 1,000 mg by mouth 2 (two) times daily with a meal.       . Multiple Vitamins-Minerals (CENTRUM SILVER PO) 1 tab po qd       . niacin (NIASPAN) 1000 MG CR tablet Take 500 mg by mouth at bedtime.       . rosuvastatin (CRESTOR) 20 MG tablet Take 20 mg by mouth daily.        . sertraline (ZOLOFT) 100 MG  tablet Take 50 mg by mouth daily. Begin after finishing celexa      . vitamin B-12 (CYANOCOBALAMIN) 1000 MCG tablet Take 1,000 mcg by mouth daily.        Marland Kitchen zolpidem (AMBIEN) 10 MG tablet       . traZODone (DESYREL) 50 MG tablet         BP 146/80  Temp(Src) 98.3 F (36.8 C) (Oral)  Wt 221 lb (100.245 kg)chart I    Objective:   Physical Exam  Constitutional: He is oriented to person, place, and time. He appears well-developed and well-nourished.  HENT:  Right Ear: External ear normal.  Left Ear: External ear normal.  Nose: Nose normal.  Mouth/Throat: Oropharynx is clear and moist.  Eyes: Conjunctivae and EOM are normal. Pupils are equal, round, and reactive to light.  Neck: Normal range of motion. Neck supple.  Cardiovascular: Normal rate and normal heart sounds.   Pulmonary/Chest: Effort normal and breath sounds normal.  Abdominal: Soft. Bowel sounds are normal. He exhibits no mass. There is tenderness. There is no rebound and no guarding.       Epigastric pain  Neurological: He is alert and oriented to person, place, and time.  Skin: Skin is warm and dry.  Psychiatric: He has a normal mood and affect.          Assessment & Plan:  Assessment: Epigastric Pain, Abdominal Pain  Plan: Nexium 40mg  1 tab po BID.  Labs sent to include bmp, cbc, h. Pylori. Call if symptoms worsen or persist. Recheck as scheduled and as needed.

## 2011-11-03 NOTE — Patient Instructions (Signed)

## 2011-11-14 DIAGNOSIS — S52599A Other fractures of lower end of unspecified radius, initial encounter for closed fracture: Secondary | ICD-10-CM | POA: Diagnosis not present

## 2011-11-24 ENCOUNTER — Encounter: Payer: Self-pay | Admitting: Internal Medicine

## 2011-11-24 ENCOUNTER — Ambulatory Visit (INDEPENDENT_AMBULATORY_CARE_PROVIDER_SITE_OTHER): Payer: Medicare Other | Admitting: Internal Medicine

## 2011-11-24 VITALS — BP 148/74 | Temp 98.6°F | Wt 223.0 lb

## 2011-11-24 DIAGNOSIS — J40 Bronchitis, not specified as acute or chronic: Secondary | ICD-10-CM | POA: Diagnosis not present

## 2011-11-24 MED ORDER — DOXYCYCLINE HYCLATE 100 MG PO TABS: 100.0000 mg | ORAL_TABLET | Freq: Two times a day (BID) | ORAL | Status: AC

## 2011-11-24 MED ORDER — HYDROCODONE-HOMATROPINE 5-1.5 MG/5ML PO SYRP: 5.0000 mL | ORAL_SOLUTION | Freq: Three times a day (TID) | ORAL | Status: AC | PRN

## 2011-11-25 NOTE — Progress Notes (Signed)
Patient ID: Brent [name]., male   DOB: [birth year], 66 y.o.   MRN: [id number] Patient comes in for evaluation of 3 day history of URI symptoms. He describes sinus congestion that has now turned into a cough. Cough is productive of yellow mucous. He denies any chest pain. He denies any shortness of breath or fever.  Past Medical History  Diagnosis Date  . CAD (coronary artery disease)   . Myocardial infarction   . HTN (hypertension)   . Hyperlipidemia   . Obesity   . Diabetes mellitus   . Fatigue   . OSA (obstructive sleep apnea)   . Persistent disorder of initiating or maintaining sleep   . Erectile dysfunction   . Nephrolithiasis   . Diverticulosis   . Hemorrhoids   . Personal history of colonic polyps 02/05/2003    History   Social History  . Marital Status: Married    Spouse Name: N/A    Number of Children: N/A  . Years of Education: N/A   Occupational History  . Not on file.   Social History Main Topics  . Smoking status: Former Smoker -- 1.5 packs/day for 30 years    Types: Cigarettes    Quit date: 09/19/1980  . Smokeless tobacco: Never Used  . Alcohol Use: 0.5 oz/week    1 drink(s) per week     social  . Drug Use: No  . Sexually Active: Not on file   Other Topics Concern  . Not on file   Social History Narrative  . No narrative on file    Past Surgical History  Procedure Date  . Coronary stent placement     stenting of the right coronary artery with followup rotational  atherectomy.   . Cholecystectomy   . Coronary angioplasty   . Orthopedic surgery   . Rotator cuff surg     Bil  . Inguinal hernia repair     right  . Foot surgery     right  . Finger surgery     right    Family History  Problem Relation Age of Onset  . Liver cancer Father   . Heart disease Sister   . Lung cancer Mother   . Lung cancer Father     Allergies  Allergen Reactions  . Penicillins     REACTION: unspecified    Current Outpatient Prescriptions on File  Prior to Visit  Medication Sig Dispense Refill  . aspirin 325 MG tablet Take 325 mg by mouth daily.        . fish oil-omega-3 fatty acids 1000 MG capsule Take 2 g by mouth 2 (two) times daily.       Marland Kitchen FLAXSEED, LINSEED, PO Take 1 tablet by mouth daily. 1 tab po bid      . fluticasone (FLONASE) 50 MCG/ACT nasal spray 2 SPRAYS EACH NOSTRIL ONCE DAILY  16 g  10  . INSULIN DETEMIR [place] Inject 60 Units into the skin once.       Marland Kitchen lisinopril (PRINIVIL,ZESTRIL) 5 MG tablet Take 5 mg by mouth daily.        Marland Kitchen LORazepam (ATIVAN) 1 MG tablet Take 0.5-1 tablets (0.5-1 mg total) by mouth 2 (two) times daily.  90 tablet  0  . metformin (FORTAMET) 1000 MG (OSM) 24 hr tablet Take 1,000 mg by mouth 2 (two) times daily with a meal.       . Multiple Vitamins-Minerals (CENTRUM SILVER PO) 1 tab po qd       .  niacin (NIASPAN) 1000 MG CR tablet Take 500 mg by mouth at bedtime.       . rosuvastatin (CRESTOR) 20 MG tablet Take 20 mg by mouth daily.        . sertraline (ZOLOFT) 100 MG tablet Take 50 mg by mouth daily. Begin after finishing celexa      . vitamin B-12 (CYANOCOBALAMIN) 1000 MCG tablet Take 1,000 mcg by mouth daily.        Marland Kitchen zolpidem (AMBIEN) 10 MG tablet          patient denies chest pain, shortness of breath, orthopnea. Denies lower extremity edema, abdominal pain, change in appetite, change in bowel movements. Patient denies rashes, musculoskeletal complaints. No other specific complaints in a complete review of systems.   BP 148/74  Temp(Src) 98.6 F (37 C) (Oral)  Wt 223 lb (101.152 kg) Well-developed male in no acute distress. HEENT exam atraumatic, normocephalic, neck supple. Chest good auscultation cardiac exam S1-S2 are regular. Abdomen; soft, soft, overweight.  Assessment and plan: He may have bronchitis. It is worth treating him with doxycycline for 7 days. He'll call if his symptoms persist.

## 2011-11-28 ENCOUNTER — Ambulatory Visit: Payer: Medicare Other | Admitting: Pulmonary Disease

## 2011-12-05 ENCOUNTER — Ambulatory Visit: Payer: Medicare Other | Admitting: Pulmonary Disease

## 2012-01-31 ENCOUNTER — Other Ambulatory Visit: Payer: Self-pay | Admitting: *Deleted

## 2012-01-31 MED ORDER — LORAZEPAM 1 MG PO TABS: 0.5000 mg | ORAL_TABLET | Freq: Two times a day (BID) | ORAL | Status: DC

## 2012-03-12 ENCOUNTER — Encounter: Payer: Self-pay | Admitting: Cardiology

## 2012-03-12 ENCOUNTER — Ambulatory Visit (INDEPENDENT_AMBULATORY_CARE_PROVIDER_SITE_OTHER): Payer: Medicare Other | Admitting: Cardiology

## 2012-03-12 VITALS — BP 146/78 | HR 75 | Ht 72.0 in | Wt 226.0 lb

## 2012-03-12 DIAGNOSIS — I2510 Atherosclerotic heart disease of native coronary artery without angina pectoris: Secondary | ICD-10-CM | POA: Diagnosis not present

## 2012-03-12 DIAGNOSIS — E119 Type 2 diabetes mellitus without complications: Secondary | ICD-10-CM

## 2012-03-12 DIAGNOSIS — E785 Hyperlipidemia, unspecified: Secondary | ICD-10-CM | POA: Diagnosis not present

## 2012-03-12 NOTE — Patient Instructions (Addendum)
Your physician wants you to follow-up in: 6 MONTHS with Dr Stuckey.  You will receive a reminder letter in the mail two months in advance. If you don't receive a letter, please call our office to schedule the follow-up appointment.  Your physician recommends that you continue on your current medications as directed. Please refer to the Current Medication list given to you today.  

## 2012-03-12 NOTE — Assessment & Plan Note (Signed)
Not under good control.  Insulin  (Levimir) is being increased.

## 2012-03-12 NOTE — Assessment & Plan Note (Signed)
I have asked him to talk with the lipid nurse at the [facility] clinic.  One option would be to consider a CTEP trial.

## 2012-03-12 NOTE — Progress Notes (Signed)
HPI:  The patient is stable. His rare chest pain, none ever related to exertion. He's been under a lot of stress at home as his son is living in a motel, and they're taking care of her grandchild on regular basis.  Is not have any exertional symptoms. He didn't bring his lipid profile today, and his HDL remains low his triglycerides modestly elevated he is being managed at the [place], and is on niacin and fenofibrate in addition to his statin.  Current Outpatient Prescriptions  Medication Sig Dispense Refill  . aspirin 325 MG tablet Take 325 mg by mouth daily.        . fenofibrate 160 MG tablet Take 160 mg by mouth daily.      . fish oil-omega-3 fatty acids 1000 MG capsule Take 2 g by mouth 2 (two) times daily.       Marland Kitchen FLAXSEED, LINSEED, PO Take 1 tablet by mouth daily. 1 tab po bid      . INSULIN DETEMIR [place] Inject 60 Units into the skin once.       Marland Kitchen lisinopril (PRINIVIL,ZESTRIL) 5 MG tablet Take 5 mg by mouth daily.        Marland Kitchen LORazepam (ATIVAN) 1 MG tablet Take 0.5-1 tablets (0.5-1 mg total) by mouth 2 (two) times daily.  90 tablet  0  . metformin (FORTAMET) 1000 MG (OSM) 24 hr tablet Take 1,000 mg by mouth 2 (two) times daily with a meal.       . Multiple Vitamins-Minerals (CENTRUM SILVER PO) 1 tab po qd       . niacin (NIASPAN) 1000 MG CR tablet Take 500 mg by mouth at bedtime.       . ramelteon (ROZEREM) 8 MG tablet Take 8 mg by mouth at bedtime.      . rosuvastatin (CRESTOR) 20 MG tablet Take 20 mg by mouth daily.        . sertraline (ZOLOFT) 100 MG tablet Take 50 mg by mouth daily. Begin after finishing celexa      . vitamin B-12 (CYANOCOBALAMIN) 1000 MCG tablet Take 1,000 mcg by mouth daily.        . fluticasone (FLONASE) 50 MCG/ACT nasal spray 2 SPRAYS EACH NOSTRIL ONCE DAILY  16 g  10  . traZODone (DESYREL) 50 MG tablet Take 1 tablet (50 mg total) by mouth at bedtime.  30 tablet  0  . zolpidem (AMBIEN) 10 MG tablet Take 1 tablet (10 mg total) by mouth at bedtime as needed for sleep.  20  tablet  0  . zolpidem (AMBIEN) 10 MG tablet         Allergies  Allergen Reactions  . Penicillins     REACTION: unspecified    Past Medical History  Diagnosis Date  . CAD (coronary artery disease)   . Myocardial infarction   . HTN (hypertension)   . Hyperlipidemia   . Obesity   . Diabetes mellitus   . Fatigue   . OSA (obstructive sleep apnea)   . Persistent disorder of initiating or maintaining sleep   . Erectile dysfunction   . Nephrolithiasis   . Diverticulosis   . Hemorrhoids   . Personal history of colonic polyps 02/05/2003    Past Surgical History  Procedure Date  . Coronary stent placement     stenting of the right coronary artery with followup rotational  atherectomy.   . Cholecystectomy   . Coronary angioplasty   . Orthopedic surgery   . Rotator cuff surg  Bil  . Inguinal hernia repair     right  . Foot surgery     right  . Finger surgery     right    Family History  Problem Relation Age of Onset  . Liver cancer Father   . Heart disease Sister   . Lung cancer Mother   . Lung cancer Father     History   Social History  . Marital Status: Married    Spouse Name: N/A    Number of Children: N/A  . Years of Education: N/A   Occupational History  . Not on file.   Social History Main Topics  . Smoking status: Former Smoker -- 1.5 packs/day for 30 years    Types: Cigarettes    Quit date: 09/19/1980  . Smokeless tobacco: Never Used  . Alcohol Use: 0.5 oz/week    1 drink(s) per week     social  . Drug Use: No  . Sexually Active: Not on file   Other Topics Concern  . Not on file   Social History Narrative  . No narrative on file    ROS: Please see the HPI.  All other systems reviewed and negative.  PHYSICAL EXAM:  BP 146/78  Pulse 75  Ht 6' (1.829 m)  Wt 226 lb (102.513 kg)  BMI 30.65 kg/m2  General: Well developed, well nourished, in no acute distress. Head:  Normocephalic and atraumatic. Neck: no JVD Lungs: Clear to  auscultation and percussion. Heart: Normal S1 and S2.  No murmur, rubs or gallops.  Pulses: Pulses normal in all 4 extremities. Extremities: No clubbing or cyanosis. No edema. Neurologic: Alert and oriented x 3.  EKG:  NSR.  WNL.  No acute changes.   ASSESSMENT AND PLAN:

## 2012-03-12 NOTE — Assessment & Plan Note (Signed)
See cath study from one year ago.  He continues to remain stable from a symptomatic standpoint.

## 2012-04-17 ENCOUNTER — Telehealth: Payer: Self-pay | Admitting: Cardiology

## 2012-04-17 NOTE — Telephone Encounter (Signed)
Please return call to patient on mobile [phone] regarding medical study information he received per [facility]

## 2012-04-17 NOTE — Telephone Encounter (Signed)
I spoke with the patient. He states he has received information from [facility] on the Accelerate Study for lipids. In order to participate, he has been told he will need to stop Niacin. He wanted to make Dr. Riley Kill aware of this and see if that would be ok with him. I advised I will forward to Dr. Riley Kill and his nurse, Leotis Shames for recommendations. I have advised that he may hear back from Lauren around Thursday with Dr. Rosalyn Charters recommendations. He is agreeable with this.

## 2012-04-18 NOTE — Telephone Encounter (Signed)
Fu call °Pt returning your call  °

## 2012-04-18 NOTE — Telephone Encounter (Signed)
Left message to call back  

## 2012-04-18 NOTE — Telephone Encounter (Signed)
I spoke with the Brent [name] and made him aware that Maureen Ralphs did speak to Dr Riley Kill about the Brent [name] needing to discontinue Niaspan for study purposes.  The Brent [name] will stop this medication due to research study.

## 2012-04-24 ENCOUNTER — Other Ambulatory Visit: Payer: Self-pay | Admitting: *Deleted

## 2012-04-24 MED ORDER — ZOLPIDEM TARTRATE 10 MG PO TABS: 5.0000 mg | ORAL_TABLET | Freq: Every evening | ORAL | Status: DC | PRN

## 2012-05-07 ENCOUNTER — Encounter (HOSPITAL_COMMUNITY): Payer: Self-pay | Admitting: Emergency Medicine

## 2012-05-07 ENCOUNTER — Emergency Department (HOSPITAL_COMMUNITY)
Admission: EM | Admit: 2012-05-07 | Discharge: 2012-05-07 | Disposition: A | Discharge status: Home / Self Care | Payer: Medicare Other | Attending: Emergency Medicine | Admitting: Emergency Medicine

## 2012-05-07 DIAGNOSIS — Z87891 Personal history of nicotine dependence: Secondary | ICD-10-CM | POA: Diagnosis not present

## 2012-05-07 DIAGNOSIS — Z794 Long term (current) use of insulin: Secondary | ICD-10-CM | POA: Diagnosis not present

## 2012-05-07 DIAGNOSIS — R51 Headache: Secondary | ICD-10-CM | POA: Insufficient documentation

## 2012-05-07 DIAGNOSIS — Z79899 Other long term (current) drug therapy: Secondary | ICD-10-CM | POA: Diagnosis not present

## 2012-05-07 DIAGNOSIS — F4321 Adjustment disorder with depressed mood: Secondary | ICD-10-CM | POA: Diagnosis not present

## 2012-05-07 DIAGNOSIS — E669 Obesity, unspecified: Secondary | ICD-10-CM | POA: Insufficient documentation

## 2012-05-07 DIAGNOSIS — I252 Old myocardial infarction: Secondary | ICD-10-CM | POA: Insufficient documentation

## 2012-05-07 DIAGNOSIS — Z9861 Coronary angioplasty status: Secondary | ICD-10-CM | POA: Diagnosis not present

## 2012-05-07 DIAGNOSIS — E785 Hyperlipidemia, unspecified: Secondary | ICD-10-CM | POA: Diagnosis not present

## 2012-05-07 DIAGNOSIS — I10 Essential (primary) hypertension: Secondary | ICD-10-CM | POA: Diagnosis not present

## 2012-05-07 DIAGNOSIS — I2510 Atherosclerotic heart disease of native coronary artery without angina pectoris: Secondary | ICD-10-CM | POA: Insufficient documentation

## 2012-05-07 DIAGNOSIS — G4733 Obstructive sleep apnea (adult) (pediatric): Secondary | ICD-10-CM | POA: Insufficient documentation

## 2012-05-07 LAB — GLUCOSE, CAPILLARY: Glucose-Capillary: 209 mg/dL — ABNORMAL HIGH (ref 70–99)

## 2012-05-07 MED ORDER — LORAZEPAM 1 MG PO TABS
1.0000 mg | ORAL_TABLET | Freq: Once | ORAL | Status: AC
  Administered 2012-05-07: 1 mg via ORAL
  Filled 2012-05-07: qty 2

## 2012-05-07 MED ORDER — ACETAMINOPHEN 325 MG PO TABS
650.0000 mg | ORAL_TABLET | Freq: Once | ORAL | Status: AC
  Administered 2012-05-07: 650 mg via ORAL
  Filled 2012-05-07: qty 2

## 2012-05-07 NOTE — ED Notes (Signed)
PT'S SON PASSED AWAY TODAY , PT. STATES HEADACHE THIS EVENING WHILE ATTENDING TO HIS WIFE AT [facility] , ALSO CONCERNED ABOUT HIS BLOOD PRESSURE.

## 2012-05-07 NOTE — ED Provider Notes (Signed)
History   This chart was scribed for Brent Szostak B. Bernette Mayers, MD by Melba Coon. The patient was seen in room TR10C/TR10C and the patient's care was started at 9:35PM.    CSN: [id number]  Arrival date & time 05/07/12  2105   None     Chief Complaint  Patient presents with  . Headache    (Consider location/radiation/quality/duration/timing/severity/associated sxs/prior treatment) HPI Brent [name]. is a 66 y.o. male who presents to the Emergency Department complaining of constant, moderate, splitting headache with an onset tonight. Pt just found out today that his son passed away. Pt initially presented to the ED for his wife in [place], but pt started to have the HA while his wife was waiting for a room. Pt is concerned about his HTN. No fever, neck pain, sore throat, rash, back pain, CP, SOB, abd pain, n/v/d, dysuria, or extremity pain, edema, weakness, numbness, or tingling. Hx of diabetes; pt forgot to take his metformin before dinner tonight but has taken his daytime dose of insulin. Pt has a Hx of anxiety and takes ativan. No other pertinent medical symptoms.   Past Medical History  Diagnosis Date  . CAD (coronary artery disease)   . Myocardial infarction   . HTN (hypertension)   . Hyperlipidemia   . Obesity   . Diabetes mellitus   . Fatigue   . OSA (obstructive sleep apnea)   . Persistent disorder of initiating or maintaining sleep   . Erectile dysfunction   . Nephrolithiasis   . Diverticulosis   . Hemorrhoids   . Personal history of colonic polyps 02/05/2003    Past Surgical History  Procedure Date  . Coronary stent placement     stenting of the right coronary artery with followup rotational  atherectomy.   . Cholecystectomy   . Coronary angioplasty   . Orthopedic surgery   . Rotator cuff surg     Bil  . Inguinal hernia repair     right  . Foot surgery     right  . Finger surgery     right    Family History  Problem Relation Age of Onset  . Liver  cancer Father   . Heart disease Sister   . Lung cancer Mother   . Lung cancer Father     History  Substance Use Topics  . Smoking status: Former Smoker -- 1.5 packs/day for 30 years    Types: Cigarettes    Quit date: 09/19/1980  . Smokeless tobacco: Never Used  . Alcohol Use: 0.5 oz/week    1 drink(s) per week     social      Review of Systems 10 Systems reviewed and all are negative for acute change except as noted in the HPI.   Allergies  Penicillins  Home Medications   Current Outpatient Rx  Name Route Sig Dispense Refill  . ASPIRIN 325 MG PO TABS Oral Take 325 mg by mouth daily.      . FENOFIBRATE 160 MG PO TABS Oral Take 160 mg by mouth daily.    . OMEGA-3 FATTY ACIDS 1000 MG PO CAPS Oral Take 1 g by mouth 2 (two) times daily.     Marland Kitchen FLAXSEED (LINSEED) PO Oral Take 1 tablet by mouth 2 (two) times daily.     Marland Kitchen FLUTICASONE PROPIONATE 50 MCG/ACT NA SUSP Nasal Place 2 sprays into the nose daily.    . INSULIN DETEMIR 100 UNIT/ML [place] SOLN Subcutaneous Inject 2-10 Units into the skin daily  before supper. Pt is on a sliding scale    . INSULIN GLARGINE 100 UNIT/ML  SOLN Subcutaneous Inject 20-50 Units into the skin 2 (two) times daily. 50 units in the am, 20 units at bedtime    . LISINOPRIL 5 MG PO TABS Oral Take 5 mg by mouth daily.      Marland Kitchen LORAZEPAM 1 MG PO TABS Oral Take 1 mg by mouth at bedtime.    Marland Kitchen METFORMIN HCL 1000 MG PO TABS Oral Take 1,000 mg by mouth 2 (two) times daily with a meal.    . CENTRUM SILVER PO  1 tab po qd     . ROSUVASTATIN CALCIUM 20 MG PO TABS Oral Take 20 mg by mouth daily.      . SERTRALINE HCL 100 MG PO TABS Oral Take 50 mg by mouth daily.     Marland Kitchen VITAMIN B-12 1000 MCG PO TABS Oral Take 1,000 mcg by mouth daily.      Marland Kitchen ZOLPIDEM TARTRATE 10 MG PO TABS Oral Take 10 mg by mouth at bedtime as needed. For insomnia    . TRAZODONE HCL 50 MG PO TABS Oral Take 1 tablet (50 mg total) by mouth at bedtime. 30 tablet 0    BP 152/63  Pulse 83  Temp 98.1 F  (36.7 C) (Oral)  Resp 14  SpO2 96%  Physical Exam  Nursing note and vitals reviewed. Constitutional: He is oriented to person, place, and time. He appears well-developed and well-nourished.  HENT:  Head: Normocephalic and atraumatic.  Eyes: EOM are normal. Pupils are equal, round, and reactive to light.  Neck: Normal range of motion. Neck supple.  Cardiovascular: Normal rate, normal heart sounds and intact distal pulses.   Pulmonary/Chest: Effort normal and breath sounds normal.  Abdominal: Bowel sounds are normal. He exhibits no distension. There is no tenderness.  Musculoskeletal: Normal range of motion. He exhibits no edema and no tenderness.  Neurological: He is alert and oriented to person, place, and time. He has normal strength. No cranial nerve deficit or sensory deficit.  Skin: Skin is warm and dry. No rash noted.  Psychiatric: He has a normal mood and affect.    ED Course  Procedures (including critical care time)  DIAGNOSTIC STUDIES: Oxygen Saturation is 96% on room air, adequate by my interpretation.    COORDINATION OF CARE:  9:37PM - tylenol, ativan, and cbg will be ordered for the pt. 10:06PM recheck; cbg is slightly elevated. Pt's HA is not alleviated.    Labs Reviewed  GLUCOSE, CAPILLARY - Abnormal; Notable for the following:    Glucose-Capillary 209 (*)     All other components within normal limits   No results found.   No diagnosis found.    MDM  Pt given Tylenol and Ativan. This is likely a grief reaction, no concern for serious cause of headache. Will d/c patient to be with his wife who is also a patient.   I personally performed the services described in the documentation, which were scribed in my presence. The recorded information has been reviewed and considered.        Ivey Nembhard B. Bernette Mayers, MD 05/07/12 2208

## 2012-05-07 NOTE — ED Notes (Signed)
Pt reports having a headache since this afternoon and blurred vision for the past two to three days.  Pt denies nausea or vomiting.

## 2012-05-19 ENCOUNTER — Encounter (HOSPITAL_COMMUNITY): Payer: Self-pay | Admitting: *Deleted

## 2012-05-19 ENCOUNTER — Emergency Department (HOSPITAL_COMMUNITY)
Admission: EM | Admit: 2012-05-19 | Discharge: 2012-05-19 | Disposition: A | Discharge status: Home / Self Care | Payer: Medicare Other | Attending: Emergency Medicine | Admitting: Emergency Medicine

## 2012-05-19 ENCOUNTER — Emergency Department (HOSPITAL_COMMUNITY): Payer: Medicare Other

## 2012-05-19 DIAGNOSIS — I10 Essential (primary) hypertension: Secondary | ICD-10-CM | POA: Insufficient documentation

## 2012-05-19 DIAGNOSIS — Z9089 Acquired absence of other organs: Secondary | ICD-10-CM | POA: Insufficient documentation

## 2012-05-19 DIAGNOSIS — K7689 Other specified diseases of liver: Secondary | ICD-10-CM | POA: Diagnosis not present

## 2012-05-19 DIAGNOSIS — N200 Calculus of kidney: Secondary | ICD-10-CM | POA: Diagnosis not present

## 2012-05-19 DIAGNOSIS — K4020 Bilateral inguinal hernia, without obstruction or gangrene, not specified as recurrent: Secondary | ICD-10-CM | POA: Diagnosis not present

## 2012-05-19 DIAGNOSIS — I2510 Atherosclerotic heart disease of native coronary artery without angina pectoris: Secondary | ICD-10-CM | POA: Insufficient documentation

## 2012-05-19 DIAGNOSIS — E119 Type 2 diabetes mellitus without complications: Secondary | ICD-10-CM | POA: Diagnosis not present

## 2012-05-19 DIAGNOSIS — R109 Unspecified abdominal pain: Secondary | ICD-10-CM | POA: Diagnosis not present

## 2012-05-19 DIAGNOSIS — Z794 Long term (current) use of insulin: Secondary | ICD-10-CM | POA: Insufficient documentation

## 2012-05-19 DIAGNOSIS — R319 Hematuria, unspecified: Secondary | ICD-10-CM | POA: Insufficient documentation

## 2012-05-19 DIAGNOSIS — I252 Old myocardial infarction: Secondary | ICD-10-CM | POA: Insufficient documentation

## 2012-05-19 LAB — URINALYSIS, ROUTINE W REFLEX MICROSCOPIC
Bilirubin Urine: NEGATIVE
Specific Gravity, Urine: 1.023 (ref 1.005–1.030)
Urobilinogen, UA: 1 mg/dL (ref 0.0–1.0)

## 2012-05-19 LAB — URINE MICROSCOPIC-ADD ON

## 2012-05-19 MED ORDER — KETOROLAC TROMETHAMINE 30 MG/ML IJ SOLN
30.0000 mg | Freq: Once | INTRAMUSCULAR | Status: AC
  Administered 2012-05-19: 30 mg via INTRAVENOUS
  Filled 2012-05-19: qty 1

## 2012-05-19 MED ORDER — SODIUM CHLORIDE 0.9 % IV BOLUS (SEPSIS)
1000.0000 mL | Freq: Once | INTRAVENOUS | Status: AC
  Administered 2012-05-19: 1000 mL via INTRAVENOUS

## 2012-05-19 MED ORDER — ONDANSETRON HCL 4 MG/2ML IJ SOLN
4.0000 mg | Freq: Once | INTRAMUSCULAR | Status: AC
  Administered 2012-05-19: 4 mg via INTRAVENOUS
  Filled 2012-05-19: qty 2

## 2012-05-19 MED ORDER — OXYCODONE-ACETAMINOPHEN 5-325 MG PO TABS: 1.0000 | ORAL_TABLET | Freq: Four times a day (QID) | ORAL | Status: AC | PRN

## 2012-05-19 MED ORDER — MORPHINE SULFATE 4 MG/ML IJ SOLN
4.0000 mg | Freq: Once | INTRAMUSCULAR | Status: AC
  Administered 2012-05-19: 4 mg via INTRAVENOUS
  Filled 2012-05-19: qty 1

## 2012-05-19 NOTE — ED Provider Notes (Signed)
History     CSN: [id number]  Arrival date & time 05/19/12  1021   First MD Initiated Contact with Patient 05/19/12 1044      Chief Complaint  Patient presents with  . Flank Pain    left    (Consider location/radiation/quality/duration/timing/severity/associated sxs/prior treatment) HPI Comments: Patient started this morning with severe left flank pain with radiation into the left groin.  He had a kidney stone many years ago and this feels the same.  No bowel or urinary complaints.  No fevers or chills.  No injury or trauma.    Patient is a 66 y.o. male presenting with flank pain. The history is provided by the patient.  Flank Pain This is a new problem. The current episode started less than 1 hour ago. The problem occurs constantly. The problem has been rapidly worsening. Associated symptoms include abdominal pain. Associated symptoms comments: Flank pain . Nothing aggravates the symptoms. Nothing relieves the symptoms. He has tried nothing for the symptoms.    Past Medical History  Diagnosis Date  . CAD (coronary artery disease)   . Myocardial infarction   . HTN (hypertension)   . Hyperlipidemia   . Obesity   . Diabetes mellitus   . Fatigue   . OSA (obstructive sleep apnea)   . Persistent disorder of initiating or maintaining sleep   . Erectile dysfunction   . Nephrolithiasis   . Diverticulosis   . Hemorrhoids   . Personal history of colonic polyps 02/05/2003  . Kidney stones     Past Surgical History  Procedure Date  . Coronary stent placement     stenting of the right coronary artery with followup rotational  atherectomy.   . Cholecystectomy   . Coronary angioplasty   . Orthopedic surgery   . Rotator cuff surg     Bil  . Inguinal hernia repair     right  . Foot surgery     right  . Finger surgery     right    Family History  Problem Relation Age of Onset  . Liver cancer Father   . Heart disease Sister   . Lung cancer Mother   . Lung cancer Father       History  Substance Use Topics  . Smoking status: Former Smoker -- 1.5 packs/day for 30 years    Types: Cigarettes    Quit date: 09/19/1980  . Smokeless tobacco: Never Used  . Alcohol Use: 0.5 oz/week    1 drink(s) per week     social      Review of Systems  Gastrointestinal: Positive for abdominal pain.  Genitourinary: Positive for flank pain.  All other systems reviewed and are negative.    Allergies  Penicillins  Home Medications   Current Outpatient Rx  Name Route Sig Dispense Refill  . ASPIRIN 325 MG PO TABS Oral Take 325 mg by mouth daily.      . FENOFIBRATE 160 MG PO TABS Oral Take 160 mg by mouth daily.    . OMEGA-3 FATTY ACIDS 1000 MG PO CAPS Oral Take 1 g by mouth 2 (two) times daily.     Marland Kitchen FLAXSEED (LINSEED) PO Oral Take 1 tablet by mouth 2 (two) times daily.     Marland Kitchen FLUTICASONE PROPIONATE 50 MCG/ACT NA SUSP Nasal Place 2 sprays into the nose daily.    . INSULIN DETEMIR 100 UNIT/ML [place] SOLN Subcutaneous Inject 2-10 Units into the skin daily before supper. Pt is on a sliding scale    .  INSULIN GLARGINE 100 UNIT/ML [place] SOLN Subcutaneous Inject 20-50 Units into the skin 2 (two) times daily. 50 units in the am, 20 units at bedtime    . LISINOPRIL 5 MG PO TABS Oral Take 5 mg by mouth daily.      Marland Kitchen LORAZEPAM 1 MG PO TABS Oral Take 1 mg by mouth at bedtime.    Marland Kitchen METFORMIN HCL 1000 MG PO TABS Oral Take 1,000 mg by mouth 2 (two) times daily with a meal.    . CENTRUM SILVER PO  1 tab po qd     . ROSUVASTATIN CALCIUM 20 MG PO TABS Oral Take 20 mg by mouth daily.      . SERTRALINE HCL 100 MG PO TABS Oral Take 50 mg by mouth daily.     . TRAZODONE HCL 50 MG PO TABS Oral Take 1 tablet (50 mg total) by mouth at bedtime. 30 tablet 0  . VITAMIN B-12 1000 MCG PO TABS Oral Take 1,000 mcg by mouth daily.      Marland Kitchen ZOLPIDEM TARTRATE 10 MG PO TABS Oral Take 10 mg by mouth at bedtime as needed. For insomnia      BP 187/94  Pulse 73  Temp 98.8 F (37.1 C) (Oral)  Resp 20  SpO2  98%  Physical Exam  Nursing note and vitals reviewed. Constitutional: He is oriented to person, place, and time. He appears well-developed and well-nourished.       Extremely uncomfortable.  HENT:  Head: Normocephalic and atraumatic.  Mouth/Throat: Oropharynx is clear and moist.  Neck: Normal range of motion.  Cardiovascular: Normal rate and regular rhythm.   Pulmonary/Chest: Breath sounds normal. No respiratory distress. He has no wheezes.  Abdominal: Soft. Bowel sounds are normal.       There is ttp in the left abdomen and left flank.  No rebound or guarding.    Musculoskeletal: Normal range of motion. He exhibits no edema.  Neurological: He is alert and oriented to person, place, and time.  Skin: He is diaphoretic.       Diaphoretic     ED Course  Procedures (including critical care time)   Labs Reviewed  URINALYSIS, ROUTINE W REFLEX MICROSCOPIC   No results found.   No diagnosis found.    MDM  The patient's presentation and ua are consistent with kidney stone.  He was given morphine and toradol and has since had complete resolution of the pain.  The ct does not reveal an obstructing stone and suspect that he had one and passed prior to the ct.  The scan does not reveal an alternative diagnosis and I believe that there is other emergent pathology.  He will be discharge with pain medication and is to return for fever or worsening of symptoms.          Geoffery Lyons, MD 05/19/12 [phone]

## 2012-05-19 NOTE — ED Notes (Signed)
Pt is aggitated and uncomfortable at triage.  Pt states woke up this am with increasing pain to left flank pain and all over.  Pt has nausea that comes in waves.

## 2012-05-19 NOTE — Discharge Instructions (Signed)
Flank Pain Flank pain refers to pain that is located on the side of the body between the upper abdomen and the back. It can be caused by many things. CAUSES  Some of the more common causes of flank pain include:  Muscle strain.   Muscle spasms.   A disease of your spine (vertebral disk disease).   A lung infection (pneumonia).   Fluid around your lungs (pulmonary edema).   A kidney infection.   Kidney stones.   A very painful skin rash on only one side of your body (shingles).   Gallbladder disease.  DIAGNOSIS  Blood tests, urine tests, and X-rays may help your caregiver determine what is wrong. TREATMENT  The treatment of pain depends on the cause. Your caregiver will determine what treatment will work best for you. HOME CARE INSTRUCTIONS   Home care will depend on the cause of your pain.   Some medications may help relieve the pain. Take medication for relief of pain as directed by your caregiver.   Tell your caregiver about any changes in your pain.   Follow up with your caregiver.  SEEK IMMEDIATE MEDICAL CARE IF:   Your pain is not controlled with medication.   The pain increases.   You have abdominal pain.   You have shortness of breath.   You have persistent nausea or vomiting.   You have swelling in your abdomen.   You feel faint or pass out.   You have a temperature by mouth above 102 F (38.9 C), not controlled by medicine.  MAKE SURE YOU:   Understand these instructions.   Will watch your condition.   Will get help right away if you are not doing well or get worse.  Document Released: 10/27/2005 Document Revised: 08/25/2011 Document Reviewed: 02/20/2010 [employer] Patient Information 2012 [employer]. 

## 2012-06-18 ENCOUNTER — Other Ambulatory Visit: Payer: Self-pay | Admitting: Urology

## 2012-06-18 DIAGNOSIS — N200 Calculus of kidney: Secondary | ICD-10-CM | POA: Insufficient documentation

## 2012-06-18 DIAGNOSIS — N402 Nodular prostate without lower urinary tract symptoms: Secondary | ICD-10-CM | POA: Diagnosis not present

## 2012-06-18 DIAGNOSIS — N400 Enlarged prostate without lower urinary tract symptoms: Secondary | ICD-10-CM | POA: Diagnosis not present

## 2012-06-18 DIAGNOSIS — R109 Unspecified abdominal pain: Secondary | ICD-10-CM | POA: Diagnosis not present

## 2012-06-18 DIAGNOSIS — R3915 Urgency of urination: Secondary | ICD-10-CM | POA: Diagnosis not present

## 2012-06-18 DIAGNOSIS — R300 Dysuria: Secondary | ICD-10-CM | POA: Diagnosis not present

## 2012-06-21 ENCOUNTER — Ambulatory Visit
Admission: RE | Admit: 2012-06-21 | Discharge: 2012-06-21 | Disposition: A | Discharge status: Home / Self Care | Payer: Medicare Other | Source: Ambulatory Visit | Attending: Urology | Admitting: Urology

## 2012-06-21 DIAGNOSIS — R109 Unspecified abdominal pain: Secondary | ICD-10-CM

## 2012-06-21 DIAGNOSIS — N289 Disorder of kidney and ureter, unspecified: Secondary | ICD-10-CM | POA: Diagnosis not present

## 2012-07-04 ENCOUNTER — Ambulatory Visit (INDEPENDENT_AMBULATORY_CARE_PROVIDER_SITE_OTHER): Payer: Medicare Other

## 2012-07-04 DIAGNOSIS — Z23 Encounter for immunization: Secondary | ICD-10-CM

## 2012-07-13 ENCOUNTER — Other Ambulatory Visit: Payer: Self-pay | Admitting: Internal Medicine

## 2012-07-16 ENCOUNTER — Emergency Department (HOSPITAL_COMMUNITY): Payer: Medicare Other

## 2012-07-16 ENCOUNTER — Encounter (HOSPITAL_COMMUNITY): Payer: Self-pay | Admitting: Cardiology

## 2012-07-16 ENCOUNTER — Emergency Department (HOSPITAL_COMMUNITY)
Admission: EM | Admit: 2012-07-16 | Discharge: 2012-07-16 | Disposition: A | Discharge status: Home / Self Care | Payer: Medicare Other | Attending: Emergency Medicine | Admitting: Emergency Medicine

## 2012-07-16 DIAGNOSIS — I2510 Atherosclerotic heart disease of native coronary artery without angina pectoris: Secondary | ICD-10-CM | POA: Insufficient documentation

## 2012-07-16 DIAGNOSIS — Z794 Long term (current) use of insulin: Secondary | ICD-10-CM | POA: Insufficient documentation

## 2012-07-16 DIAGNOSIS — Z8719 Personal history of other diseases of the digestive system: Secondary | ICD-10-CM | POA: Insufficient documentation

## 2012-07-16 DIAGNOSIS — R300 Dysuria: Secondary | ICD-10-CM | POA: Insufficient documentation

## 2012-07-16 DIAGNOSIS — I10 Essential (primary) hypertension: Secondary | ICD-10-CM | POA: Insufficient documentation

## 2012-07-16 DIAGNOSIS — I252 Old myocardial infarction: Secondary | ICD-10-CM | POA: Diagnosis not present

## 2012-07-16 DIAGNOSIS — N2889 Other specified disorders of kidney and ureter: Secondary | ICD-10-CM | POA: Diagnosis not present

## 2012-07-16 DIAGNOSIS — G4733 Obstructive sleep apnea (adult) (pediatric): Secondary | ICD-10-CM | POA: Insufficient documentation

## 2012-07-16 DIAGNOSIS — IMO0002 Reserved for concepts with insufficient information to code with codable children: Secondary | ICD-10-CM | POA: Insufficient documentation

## 2012-07-16 DIAGNOSIS — E119 Type 2 diabetes mellitus without complications: Secondary | ICD-10-CM | POA: Diagnosis not present

## 2012-07-16 DIAGNOSIS — Z860100 Personal history of colon polyps, unspecified: Secondary | ICD-10-CM | POA: Insufficient documentation

## 2012-07-16 DIAGNOSIS — E669 Obesity, unspecified: Secondary | ICD-10-CM | POA: Diagnosis not present

## 2012-07-16 DIAGNOSIS — E785 Hyperlipidemia, unspecified: Secondary | ICD-10-CM | POA: Diagnosis not present

## 2012-07-16 DIAGNOSIS — R109 Unspecified abdominal pain: Secondary | ICD-10-CM | POA: Diagnosis not present

## 2012-07-16 DIAGNOSIS — Z7982 Long term (current) use of aspirin: Secondary | ICD-10-CM | POA: Diagnosis not present

## 2012-07-16 DIAGNOSIS — R82998 Other abnormal findings in urine: Secondary | ICD-10-CM | POA: Diagnosis not present

## 2012-07-16 DIAGNOSIS — Z79899 Other long term (current) drug therapy: Secondary | ICD-10-CM | POA: Diagnosis not present

## 2012-07-16 DIAGNOSIS — Z87891 Personal history of nicotine dependence: Secondary | ICD-10-CM | POA: Diagnosis not present

## 2012-07-16 DIAGNOSIS — N200 Calculus of kidney: Secondary | ICD-10-CM | POA: Insufficient documentation

## 2012-07-16 LAB — URINE MICROSCOPIC-ADD ON

## 2012-07-16 LAB — BASIC METABOLIC PANEL
CO2: 23 mEq/L (ref 19–32)
Calcium: 9.4 mg/dL (ref 8.4–10.5)
Glucose, Bld: 244 mg/dL — ABNORMAL HIGH (ref 70–99)
Potassium: 4.2 mEq/L (ref 3.5–5.1)
Sodium: 136 mEq/L (ref 135–145)

## 2012-07-16 LAB — URINALYSIS, ROUTINE W REFLEX MICROSCOPIC
Nitrite: NEGATIVE
Specific Gravity, Urine: 1.025 (ref 1.005–1.030)
Urobilinogen, UA: 1 mg/dL (ref 0.0–1.0)

## 2012-07-16 LAB — CBC
Hemoglobin: 14.4 g/dL (ref 13.0–17.0)
MCH: 30.3 pg (ref 26.0–34.0)
RBC: 4.75 MIL/uL (ref 4.22–5.81)

## 2012-07-16 MED ORDER — HYDROMORPHONE HCL PF 1 MG/ML IJ SOLN
1.0000 mg | Freq: Once | INTRAMUSCULAR | Status: AC
  Administered 2012-07-16: 1 mg via INTRAVENOUS
  Filled 2012-07-16: qty 1

## 2012-07-16 MED ORDER — ONDANSETRON HCL 4 MG/2ML IJ SOLN
4.0000 mg | INTRAMUSCULAR | Status: DC | PRN
  Administered 2012-07-16: 4 mg via INTRAVENOUS

## 2012-07-16 MED ORDER — MORPHINE SULFATE 4 MG/ML IJ SOLN
8.0000 mg | Freq: Once | INTRAMUSCULAR | Status: AC
  Administered 2012-07-16: 8 mg via INTRAVENOUS
  Filled 2012-07-16: qty 2

## 2012-07-16 MED ORDER — ONDANSETRON HCL 4 MG/2ML IJ SOLN
INTRAMUSCULAR | Status: AC
  Filled 2012-07-16: qty 2

## 2012-07-16 MED ORDER — KETOROLAC TROMETHAMINE 30 MG/ML IJ SOLN: 30.0000 mg | Freq: Once | INTRAMUSCULAR | Status: DC

## 2012-07-16 MED ORDER — KETOROLAC TROMETHAMINE 30 MG/ML IJ SOLN
INTRAMUSCULAR | Status: AC
  Filled 2012-07-16: qty 1

## 2012-07-16 MED ORDER — PERCOCET 5-325 MG PO TABS: 1.0000 | ORAL_TABLET | Freq: Four times a day (QID) | ORAL | Status: DC | PRN

## 2012-07-16 MED ORDER — NAPROXEN 375 MG PO TABS: 375.0000 mg | ORAL_TABLET | Freq: Two times a day (BID) | ORAL | Status: DC

## 2012-07-16 NOTE — ED Notes (Signed)
Pt reports left sided flank pain that started this morning. Reports he took pain medication at home but seems to be wearing off now. Pt reports some nausea, no vomiting. Skin warm and dry.

## 2012-07-16 NOTE — ED Notes (Signed)
Patient transported to CT 

## 2012-07-16 NOTE — ED Provider Notes (Signed)
Medical screening examination/treatment/procedure(s) were performed by non-physician practitioner and as supervising physician I was immediately available for consultation/collaboration.    Nelia Shi, MD 07/16/12 [phone]

## 2012-07-16 NOTE — ED Notes (Signed)
Pt. Returned from CT.

## 2012-07-16 NOTE — ED Provider Notes (Signed)
History     CSN: [id number]  Arrival date & time 07/16/12  1127   First MD Initiated Contact with Patient 07/16/12 1311      Chief Complaint  Patient presents with  . Flank Pain    (Consider location/radiation/quality/duration/timing/severity/associated sxs/prior treatment) HPI Comments: 66 year old male with a history of MI, diabetes, diverticulosis and nephrolithiasis presents emergency department with a chief complaint of left flank pain.  Patient reports a similar episode last August which she was evaluated for in this emergency department.  Patient was diagnosed with a passed stone even know there was no stone seen in the arteries at that time.  One week later the patient reports another attack of severe flank pain that resolved after approximately 20 minutes.  Today's episode began acutely at 730 a.m.  Patient took a Percocet at 930 a.m. which E. the pain, however it returned and has gradually worsened since.  Pain is located in his left flank with radiation to the groin.  Severity 10/10.  Pain worsened by any movement or vibration.  Patient recently saw urologist 6 weeks ago and have a followup appointment scheduled for November 11 (PSA 1.3).  Associated symptoms include urinary urgency the patient denies frequency, hematuria, dysuria, change in appetite, abdominal pain, syncope, fever, chills, change in bowel movement, leg weakness, trauma, or weight loss.  Patient is a 66 y.o. male presenting with flank pain.  Flank Pain Associated symptoms include diaphoresis. Pertinent negatives include no abdominal pain, chest pain, chills, coughing, fatigue, fever, headaches, nausea, rash or vomiting.    Past Medical History  Diagnosis Date  . CAD (coronary artery disease)   . Myocardial infarction   . HTN (hypertension)   . Hyperlipidemia   . Obesity   . Diabetes mellitus   . Fatigue   . OSA (obstructive sleep apnea)   . Persistent disorder of initiating or maintaining sleep   .  Erectile dysfunction   . Nephrolithiasis   . Diverticulosis   . Hemorrhoids   . Personal history of colonic polyps 02/05/2003  . Kidney stones     Past Surgical History  Procedure Date  . Coronary stent placement     stenting of the right coronary artery with followup rotational  atherectomy.   . Cholecystectomy   . Coronary angioplasty   . Orthopedic surgery   . Rotator cuff surg     Bil  . Inguinal hernia repair     right  . Foot surgery     right  . Finger surgery     right    Family History  Problem Relation Age of Onset  . Liver cancer Father   . Heart disease Sister   . Lung cancer Mother   . Lung cancer Father     History  Substance Use Topics  . Smoking status: Former Smoker -- 1.5 packs/day for 30 years    Types: Cigarettes    Quit date: 09/19/1980  . Smokeless tobacco: Never Used  . Alcohol Use: 0.5 oz/week    1 drink(s) per week     social      Review of Systems  Constitutional: Positive for diaphoresis. Negative for fever, chills and fatigue.  HENT: Negative for ear pain and sinus pressure.   Eyes: Negative for visual disturbance.  Respiratory: Negative for cough.   Cardiovascular: Negative for chest pain.  Gastrointestinal: Negative for nausea, vomiting and abdominal pain.  Genitourinary: Positive for urgency, flank pain and difficulty urinating. Negative for dysuria, discharge, penile swelling, scrotal  swelling, genital sores, penile pain and testicular pain.  Skin: Negative for color change and rash.  Neurological: Negative for dizziness and headaches.  Psychiatric/Behavioral: Negative for confusion.  All other systems reviewed and are negative.    Allergies  Penicillins  Home Medications   Current Outpatient Rx  Name Route Sig Dispense Refill  . ASPIRIN 325 MG PO TABS Oral Take 325 mg by mouth daily.      . FENOFIBRATE 160 MG PO TABS Oral Take 160 mg by mouth daily.    . OMEGA-3 FATTY ACIDS 1000 MG PO CAPS Oral Take 1 g by mouth 2  (two) times daily.     Marland Kitchen FLAXSEED (LINSEED) PO Oral Take 1 tablet by mouth 2 (two) times daily.     Marland Kitchen FLUTICASONE PROPIONATE 50 MCG/ACT NA SUSP Nasal Place 2 sprays into the nose daily.    . INSULIN DETEMIR 100 UNIT/ML Martin SOLN Subcutaneous Inject 2-10 Units into the skin daily before supper. Pt is on a sliding scale    . INSULIN GLARGINE 100 UNIT/ML  SOLN Subcutaneous Inject 20-50 Units into the skin 2 (two) times daily. 50 units in the am, 20 units at bedtime    . LISINOPRIL 5 MG PO TABS Oral Take 5 mg by mouth daily.      Marland Kitchen LORAZEPAM 1 MG PO TABS  TAKE 1/2 TABLET BY MOUTH TWICE A DAY AS NEEDED 90 tablet 0  . METFORMIN HCL 1000 MG PO TABS Oral Take 1,000 mg by mouth 2 (two) times daily with a meal.    . CENTRUM SILVER PO  1 tab po qd     . ROSUVASTATIN CALCIUM 20 MG PO TABS Oral Take 20 mg by mouth daily.      . SERTRALINE HCL 100 MG PO TABS Oral Take 50 mg by mouth daily.     Marland Kitchen VITAMIN B-12 1000 MCG PO TABS Oral Take 1,000 mcg by mouth daily.      Marland Kitchen ZOLPIDEM TARTRATE 10 MG PO TABS Oral Take 10 mg by mouth at bedtime as needed. For insomnia      BP 180/77  Pulse 70  Temp 98.3 F (36.8 C) (Oral)  Resp 20  SpO2 98%  Physical Exam  Nursing note and vitals reviewed. Constitutional: He is oriented to person, place, and time. He appears well-developed and well-nourished. He appears distressed.  HENT:  Head: Normocephalic and atraumatic.  Eyes: Conjunctivae normal and EOM are normal.  Neck: Normal range of motion. Neck supple.  Cardiovascular: Normal rate, regular rhythm and normal heart sounds.   Pulmonary/Chest: Effort normal.  Abdominal: Soft. Normal appearance and bowel sounds are normal. He exhibits no ascites and no pulsatile midline mass. There is CVA tenderness (left ).       Soft  Non-tender abdomen, normal bowel sounds. Non pulsatile aorta. No masses palpated  Genitourinary: Testes normal and penis normal. No phimosis, paraphimosis, hypospadias, penile erythema or penile  tenderness. No discharge found.       Exam chaperoned: penis and scrotum/testes normal, no evidence of incarcerated or strangulated hernia   Musculoskeletal: Normal range of motion.  Neurological: He is alert and oriented to person, place, and time.  Skin: Skin is warm and dry. He is not diaphoretic.  Psychiatric: He has a normal mood and affect. His behavior is normal.    ED Course  Procedures (including critical care time)  Labs Reviewed  BASIC METABOLIC PANEL - Abnormal; Notable for the following:    Glucose, Bld 244 (*)  GFR calc non Af Amer 88 (*)     All other components within normal limits  CBC  URINALYSIS, ROUTINE W REFLEX MICROSCOPIC   Ct Abdomen Pelvis Wo Contrast  07/16/2012  *RADIOLOGY REPORT*  Clinical Data: Left flank pain since this morning.  History of kidney stones.  CT ABDOMEN AND PELVIS WITHOUT CONTRAST  Technique:  Multidetector CT imaging of the abdomen and pelvis was performed following the standard protocol without intravenous contrast.  Comparison: 05/19/2012  Findings: There is a 10 mm stone at the left ureterovesicular junction that causes left mild hydroureter nephrosis and more significant left perinephric stranding and pararenal fluid attenuation.  The a large calcification arising from the parenchyma of the posterior upper pole of the left kidney measuring 3.5 cm is stable. A small low density area along the medial aspect of the left kidney at the mid pole, likely a cyst, is also stable.  No other renal masses.  The right kidney demonstrates a 8-3 mm nonobstructing calculus in the lower pole.  The right renal collecting system is otherwise unremarkable.  Normal right ureter.  Normal bladder.  There is some minor reticular opacity at the lung bases likely subsegmental atelectasis, scarring or a combination.  The heart is normal in size.  The liver shows extensive fatty infiltration.  This is stable.  No liver mass or focal lesion.  Normal spleen and pancreas.  No  bile duct dilation.  The gallbladder surgically absent.  No adrenal masses.  No adenopathy.  No abnormal fluid collections.  There are diverticula along the colon, mostly the left colon.  No diverticulitis.  Bowel is otherwise unremarkable.  A normal appendix is visualized.  Degenerative changes of the visualized spine.  No osteoblastic or osteolytic lesions.  IMPRESSION: 2 mm stone at the left ureterovesicular junction causes mild hydroureteronephrosis and more significant perinephric and pararenal stranding/edema.  No other acute findings.  Nonobstructing stone in the right kidney.  Large renal cortical calcification in the upper pole of the left kidney, stable.   Original Report Authenticated By: Domenic Moras, M.D.      No diagnosis found.  BP 180/77  Pulse 70  Temp 98.3 F (36.8 C) (Oral)  Resp 20  SpO2 98%   MDM  Kidney stone  Pt has been diagnosed with a Kidney Stone via CT. There is no evidence of significant hydronephrosis, serum creatine WNL, vitals sign stable and the pt does not have irratractable vomiting. Pt will be dc home with pain medications & has been advised to keep his urology apt scheduled for Nov 11th         Jaci Carrel, [place] 07/16/12 1541

## 2012-07-30 DIAGNOSIS — N481 Balanitis: Secondary | ICD-10-CM | POA: Insufficient documentation

## 2012-07-30 DIAGNOSIS — N320 Bladder-neck obstruction: Secondary | ICD-10-CM | POA: Insufficient documentation

## 2012-07-30 DIAGNOSIS — N201 Calculus of ureter: Secondary | ICD-10-CM | POA: Diagnosis not present

## 2012-07-30 DIAGNOSIS — N200 Calculus of kidney: Secondary | ICD-10-CM | POA: Diagnosis not present

## 2012-07-30 DIAGNOSIS — N476 Balanoposthitis: Secondary | ICD-10-CM | POA: Diagnosis not present

## 2012-08-01 ENCOUNTER — Encounter: Payer: Self-pay | Admitting: Pulmonary Disease

## 2012-08-01 ENCOUNTER — Ambulatory Visit (INDEPENDENT_AMBULATORY_CARE_PROVIDER_SITE_OTHER): Payer: Medicare Other | Admitting: Pulmonary Disease

## 2012-08-01 VITALS — BP 150/82 | HR 99 | Ht 72.0 in | Wt 234.0 lb

## 2012-08-01 DIAGNOSIS — G4733 Obstructive sleep apnea (adult) (pediatric): Secondary | ICD-10-CM | POA: Diagnosis not present

## 2012-08-01 NOTE — Assessment & Plan Note (Signed)
The pt is doing well on cpap, and feels that he is sleeping well with adequate daytime alertness.  He is having no mask or pressure issues, but feels that his humidifier may not be working properly.  I will have his dme check this.  I have asked the pt to keep up with mask changes and supplies, and to work aggressively on weight loss.

## 2012-08-01 NOTE — Patient Instructions (Addendum)
Continue with cpap, and keep up with mask changes and supplies. Work on [employer] loss Will have your dme check your humidifier. followup with me in one year if you are doing well.

## 2012-08-01 NOTE — Progress Notes (Signed)
  Subjective:    Patient ID: Brent [name], male    DOB: 1946/04/12, 66 y.o.   MRN: [id number]  HPI The patient comes in today for followup of his known obstructive sleep apnea.  He is wearing CPAP compliantly, and feels that his sleep and daytime alertness are doing well.  He is having no issues with his mask fit or pressure, but is having an issue with his humidifier.  Of note, the patient's weight is up 10 pounds since his last visit.   Review of Systems  Constitutional: Negative for fever and unexpected weight change.  HENT: Negative for ear pain, nosebleeds, congestion, sore throat, rhinorrhea, sneezing, trouble swallowing, dental problem, postnasal drip and sinus pressure.   Eyes: Negative for redness and itching.  Respiratory: Positive for cough. Negative for chest tightness, shortness of breath and wheezing.   Cardiovascular: Negative for palpitations and leg swelling.  Gastrointestinal: Negative for nausea and vomiting.  Genitourinary: Negative for dysuria.  Musculoskeletal: Negative for joint swelling.  Skin: Negative for rash.  Neurological: Negative for headaches.  Hematological: Does not bruise/bleed easily.  Psychiatric/Behavioral: Negative for dysphoric mood. The patient is not nervous/anxious.        Objective:   Physical Exam Overweight male in no acute distress No skin breakdown or pressure necrosis from the CPAP mask Nose without purulence or discharge noted Lower extremities without edema, no cyanosis Alert and oriented, moves all 4 extremities, does not appear to be sleepy.       Assessment & Plan:

## 2012-08-07 ENCOUNTER — Telehealth: Payer: Self-pay | Admitting: Pulmonary Disease

## 2012-08-07 NOTE — Telephone Encounter (Signed)
Signed and put in "to fax" area.

## 2012-08-07 NOTE — Telephone Encounter (Signed)
Sharyn Creamer, spoke with Crystal who stated that a Letter of Medical Necessity was faxed on 11.14.13 for [facility] to sign so that pt may get his new CPAP and heater.  Form located, handed to [place] to place in [facility] lookat.  Will forward message to [facility].

## 2012-08-09 NOTE — Telephone Encounter (Signed)
Spoke with Crystal to verify they received fax from [facility], states that fax was received but now they are needing last OV notes for face-to-face confirmation.  OV note 11/13 printed and faxed to [facility] ATTN:CAROL/CRYSTAL [phone]

## 2012-08-28 ENCOUNTER — Other Ambulatory Visit: Payer: Self-pay | Admitting: Internal Medicine

## 2012-08-29 ENCOUNTER — Encounter: Payer: Self-pay | Admitting: Family Medicine

## 2012-08-29 ENCOUNTER — Ambulatory Visit (INDEPENDENT_AMBULATORY_CARE_PROVIDER_SITE_OTHER): Payer: Medicare Other | Admitting: Family Medicine

## 2012-08-29 ENCOUNTER — Ambulatory Visit: Payer: Medicare Other | Admitting: Family Medicine

## 2012-08-29 VITALS — BP 160/78 | Temp 98.2°F | Wt 236.0 lb

## 2012-08-29 DIAGNOSIS — J209 Acute bronchitis, unspecified: Secondary | ICD-10-CM

## 2012-08-29 MED ORDER — HYDROCODONE-HOMATROPINE 5-1.5 MG/5ML PO SYRP: 5.0000 mL | ORAL_SOLUTION | Freq: Four times a day (QID) | ORAL | Status: AC | PRN

## 2012-08-29 NOTE — Progress Notes (Signed)
  Subjective:    Patient ID: Brent [name], male    DOB: 05-10-1946, 66 y.o.   MRN: [id number]  HPI  Patient seen four day history of cough. Productive of green sputum. Mild sore throat. Mild nasal congestion. No fever. No dyspnea. Ex-smoker. No history of asthma.  No sick contacts. Type 2 diabetes which has been somewhat poorly controlled.  Past Medical History  Diagnosis Date  . CAD (coronary artery disease)   . Myocardial infarction   . HTN (hypertension)   . Hyperlipidemia   . Obesity   . Diabetes mellitus   . Fatigue   . OSA (obstructive sleep apnea)   . Persistent disorder of initiating or maintaining sleep   . Erectile dysfunction   . Nephrolithiasis   . Diverticulosis   . Hemorrhoids   . Personal history of colonic polyps 02/05/2003  . Kidney stones    Past Surgical History  Procedure Date  . Coronary stent placement     stenting of the right coronary artery with followup rotational  atherectomy.   . Cholecystectomy   . Coronary angioplasty   . Orthopedic surgery   . Rotator cuff surg     Bil  . Inguinal hernia repair     right  . Foot surgery     right  . Finger surgery     right    reports that he quit smoking about 31 years ago. His smoking use included Cigarettes. He has a 45 pack-year smoking history. He has never used smokeless tobacco. He reports that he drinks about .5 ounces of alcohol per week. He reports that he does not use illicit drugs. family history includes Heart disease in his sister; Liver cancer in his father; and Lung cancer in his father and mother. Allergies  Allergen Reactions  . Penicillins     REACTION: unspecified     Review of Systems  Constitutional: Negative for fever and chills.  HENT: Positive for congestion and sore throat.   Respiratory: Positive for cough. Negative for shortness of breath and wheezing.   Cardiovascular: Negative for chest pain.  Neurological: Negative for headaches.       Objective:   Physical  Exam  Constitutional: He appears well-developed and well-nourished.  HENT:  Right Ear: External ear normal.  Left Ear: External ear normal.  Mouth/Throat: Oropharynx is clear and moist.  Neck: Neck supple.  Cardiovascular: Normal rate and regular rhythm.   Pulmonary/Chest: Effort normal and breath sounds normal. No respiratory distress. He has no wheezes. He has no rales.  Musculoskeletal: He exhibits no edema.  Lymphadenopathy:    He has no cervical adenopathy.          Assessment & Plan:  Acute bronchitis. Suspect viral. Hycodan cough syrup 1 teaspoon each bedtime as needed. Followup promptly for fever or worsening symptoms

## 2012-08-29 NOTE — Patient Instructions (Addendum)

## 2012-08-30 ENCOUNTER — Telehealth: Payer: Self-pay | Admitting: Pulmonary Disease

## 2012-08-30 NOTE — Telephone Encounter (Signed)
Pt has spoke to [email or web address]  she informed him to turn up his humidifier and see if that makes a difference and he is to call them back tomorrow Tobe Sos

## 2012-09-14 DIAGNOSIS — L280 Lichen simplex chronicus: Secondary | ICD-10-CM | POA: Diagnosis not present

## 2012-09-14 DIAGNOSIS — L570 Actinic keratosis: Secondary | ICD-10-CM | POA: Diagnosis not present

## 2012-09-14 DIAGNOSIS — L821 Other seborrheic keratosis: Secondary | ICD-10-CM | POA: Diagnosis not present

## 2012-09-20 ENCOUNTER — Other Ambulatory Visit: Payer: Self-pay | Admitting: *Deleted

## 2012-09-20 MED ORDER — FLUTICASONE PROPIONATE 50 MCG/ACT NA SUSP: 2.0000 | Freq: Every day | NASAL | Status: DC

## 2012-09-24 ENCOUNTER — Encounter: Payer: Self-pay | Admitting: Cardiology

## 2012-09-24 ENCOUNTER — Ambulatory Visit (INDEPENDENT_AMBULATORY_CARE_PROVIDER_SITE_OTHER): Payer: Medicare Other | Admitting: Cardiology

## 2012-09-24 VITALS — BP 132/70 | HR 81 | Ht 72.0 in | Wt 234.0 lb

## 2012-09-24 DIAGNOSIS — E785 Hyperlipidemia, unspecified: Secondary | ICD-10-CM | POA: Diagnosis not present

## 2012-09-24 DIAGNOSIS — E119 Type 2 diabetes mellitus without complications: Secondary | ICD-10-CM

## 2012-09-24 DIAGNOSIS — I2510 Atherosclerotic heart disease of native coronary artery without angina pectoris: Secondary | ICD-10-CM

## 2012-09-24 NOTE — Patient Instructions (Addendum)
Your physician wants you to follow-up in: 6 MONTHS with Dr McAlhany.  You will receive a reminder letter in the mail two months in advance. If you don't receive a letter, please call our office to schedule the follow-up appointment.  Your physician recommends that you continue on your current medications as directed. Please refer to the Current Medication list given to you today.  

## 2012-09-24 NOTE — Assessment & Plan Note (Signed)
Currently in HDL raising trial with CTEP inhibitor.  Labs from the [place] are in the chart.

## 2012-09-24 NOTE — Assessment & Plan Note (Signed)
Needs better control.  Currently working with the [place].

## 2012-09-24 NOTE — Assessment & Plan Note (Signed)
He continues to remain stable.  Better DM control would be helpful.  No new changes.

## 2012-09-24 NOTE — Progress Notes (Signed)
HPI:  Patient seen back in followup. From a cardiac standpoint he has been stable. His diabetes has been poorly controlled. He says that they are trauma with the death of his son.  Denies any chest pain.    Current Outpatient Prescriptions  Medication Sig Dispense Refill  . aspirin 325 MG tablet Take 325 mg by mouth daily.        Marland Kitchen atorvastatin (LIPITOR) 40 MG tablet Take 40 mg by mouth daily.      . fenofibrate 160 MG tablet Take 160 mg by mouth daily.      . fish oil-omega-3 fatty acids 1000 MG capsule Take 1 g by mouth 2 (two) times daily.       Marland Kitchen FLAXSEED, LINSEED, PO Take 1 tablet by mouth 2 (two) times daily.       . fluticasone (FLONASE) 50 MCG/ACT nasal spray Place 2 sprays into the nose daily.  16 g  2  . insulin detemir (LEVEMIR) 100 UNIT/ML injection Inject 2-10 Units into the skin daily before supper. Pt is on a sliding scale      . insulin glargine (LANTUS) 100 UNIT/ML injection Inject 20-50 Units into the skin 2 (two) times daily. 50 units in the am, 20 units at bedtime      . lisinopril (PRINIVIL,ZESTRIL) 5 MG tablet Take 5 mg by mouth daily.        Marland Kitchen LORazepam (ATIVAN) 1 MG tablet Take 0.5 mg by mouth 2 (two) times daily as needed. For anxiety      . metFORMIN (GLUCOPHAGE) 1000 MG tablet Take 1,000 mg by mouth 2 (two) times daily with a meal.      . Multiple Vitamins-Minerals (CENTRUM SILVER PO) 1 tab po qd       . sertraline (ZOLOFT) 100 MG tablet Take 50 mg by mouth daily.       . STUDY MEDICATION Take 1 tablet by mouth daily. Accelerate Study Drug (to raise HDL) Subject ID# [id number] [facility] Dr Olga Millers      . vitamin B-12 (CYANOCOBALAMIN) 1000 MCG tablet Take 1,000 mcg by mouth daily.        Marland Kitchen zolpidem (AMBIEN) 10 MG tablet Take 10 mg by mouth at bedtime as needed. For insomnia        Allergies  Allergen Reactions  . Penicillins     REACTION: unspecified    Past Medical History  Diagnosis Date  . CAD (coronary artery disease)   . Myocardial  infarction   . HTN (hypertension)   . Hyperlipidemia   . Obesity   . Diabetes mellitus   . Fatigue   . OSA (obstructive sleep apnea)   . Persistent disorder of initiating or maintaining sleep   . Erectile dysfunction   . Nephrolithiasis   . Diverticulosis   . Hemorrhoids   . Personal history of colonic polyps 02/05/2003  . Kidney stones     Past Surgical History  Procedure Date  . Coronary stent placement     stenting of the right coronary artery with followup rotational  atherectomy.   . Cholecystectomy   . Coronary angioplasty   . Orthopedic surgery   . Rotator cuff surg     Bil  . Inguinal hernia repair     right  . Foot surgery     right  . Finger surgery     right    Family History  Problem Relation Age of Onset  . Liver cancer Father   .  Heart disease Sister   . Lung cancer Mother   . Lung cancer Father     History   Social History  . Marital Status: Married    Spouse Name: N/A    Number of Children: N/A  . Years of Education: N/A   Occupational History  . Not on file.   Social History Main Topics  . Smoking status: Former Smoker -- 1.5 packs/day for 30 years    Types: Cigarettes    Quit date: 09/19/1980  . Smokeless tobacco: Never Used  . Alcohol Use: 0.5 oz/week    1 drink(s) per week     Comment: social  . Drug Use: No  . Sexually Active: Not on file   Other Topics Concern  . Not on file   Social History Narrative  . No narrative on file    ROS: Please see the HPI.  All other systems reviewed and negative.  PHYSICAL EXAM:  BP 132/70  Pulse 81  Ht 6' (1.829 m)  Wt 234 lb (106.142 kg)  BMI 31.74 kg/m2  SpO2 98%  General: Well developed, well nourished, in no acute distress. Head:  Normocephalic and atraumatic. Neck: no JVD Lungs: Clear to auscultation and percussion. Heart: Normal S1 and S2.  No murmur, rubs or gallops.  Pulses: Pulses normal in all 4 extremities. Extremities: No clubbing or cyanosis. No  edema. Neurologic: Alert and oriented x 3.  EKG:  NSR.  Delay in R wave progression.    ASSESSMENT AND PLAN:

## 2012-09-30 ENCOUNTER — Other Ambulatory Visit: Payer: Self-pay | Admitting: Internal Medicine

## 2012-10-15 ENCOUNTER — Other Ambulatory Visit: Payer: Self-pay | Admitting: Urology

## 2012-10-15 DIAGNOSIS — N1330 Unspecified hydronephrosis: Secondary | ICD-10-CM

## 2012-10-15 DIAGNOSIS — N201 Calculus of ureter: Secondary | ICD-10-CM | POA: Diagnosis not present

## 2012-10-15 DIAGNOSIS — N320 Bladder-neck obstruction: Secondary | ICD-10-CM | POA: Diagnosis not present

## 2012-10-15 DIAGNOSIS — N400 Enlarged prostate without lower urinary tract symptoms: Secondary | ICD-10-CM | POA: Diagnosis not present

## 2012-10-16 ENCOUNTER — Ambulatory Visit
Admission: RE | Admit: 2012-10-16 | Discharge: 2012-10-16 | Disposition: A | Discharge status: Home / Self Care | Payer: Medicare Other | Source: Ambulatory Visit | Attending: Urology | Admitting: Urology

## 2012-10-16 DIAGNOSIS — N1330 Unspecified hydronephrosis: Secondary | ICD-10-CM

## 2012-10-16 DIAGNOSIS — N281 Cyst of kidney, acquired: Secondary | ICD-10-CM | POA: Diagnosis not present

## 2012-10-16 DIAGNOSIS — N200 Calculus of kidney: Secondary | ICD-10-CM | POA: Diagnosis not present

## 2012-11-08 ENCOUNTER — Other Ambulatory Visit: Payer: Self-pay | Admitting: *Deleted

## 2012-11-08 MED ORDER — FLUTICASONE PROPIONATE 50 MCG/ACT NA SUSP: 2.0000 | Freq: Every day | NASAL | Status: DC

## 2012-11-08 NOTE — Telephone Encounter (Signed)
New Flonase RX sent per insurance request of 90 day supply.

## 2012-12-10 ENCOUNTER — Other Ambulatory Visit: Payer: Self-pay | Admitting: Internal Medicine

## 2013-01-09 ENCOUNTER — Ambulatory Visit (INDEPENDENT_AMBULATORY_CARE_PROVIDER_SITE_OTHER): Payer: Medicare Other | Admitting: Internal Medicine

## 2013-01-09 ENCOUNTER — Encounter: Payer: Self-pay | Admitting: Internal Medicine

## 2013-01-09 VITALS — BP 158/80 | HR 84 | Temp 98.1°F | Wt 233.0 lb

## 2013-01-09 DIAGNOSIS — E785 Hyperlipidemia, unspecified: Secondary | ICD-10-CM

## 2013-01-09 DIAGNOSIS — E119 Type 2 diabetes mellitus without complications: Secondary | ICD-10-CM

## 2013-01-09 DIAGNOSIS — I2510 Atherosclerotic heart disease of native coronary artery without angina pectoris: Secondary | ICD-10-CM

## 2013-01-09 DIAGNOSIS — E1159 Type 2 diabetes mellitus with other circulatory complications: Secondary | ICD-10-CM

## 2013-01-09 LAB — BASIC METABOLIC PANEL
CO2: 25 mEq/L (ref 19–32)
GFR: 92.66 mL/min (ref >60.00)
Glucose, Bld: 288 mg/dL — ABNORMAL HIGH (ref 70–99)
Potassium: 4.1 mEq/L (ref 3.5–5.1)
Sodium: 136 mEq/L (ref 135–145)

## 2013-01-09 LAB — LIPID PANEL
HDL: 25.5 mg/dL — ABNORMAL LOW (ref >39.00)
VLDL: 110.8 mg/dL — ABNORMAL HIGH (ref 0.0–40.0)

## 2013-01-09 LAB — HEPATIC FUNCTION PANEL
Albumin: 3.9 g/dL (ref 3.5–5.2)
Alkaline Phosphatase: 108 U/L (ref 39–117)
Total Protein: 6.6 g/dL (ref 6.0–8.3)

## 2013-01-09 LAB — MICROALBUMIN / CREATININE URINE RATIO
Creatinine,U: 62.8 mg/dL
Microalb, Ur: 1.7 mg/dL (ref 0.0–1.9)

## 2013-01-09 MED ORDER — INSULIN ASPART 100 UNIT/ML ~~LOC~~ SOLN: SUBCUTANEOUS | Status: DC

## 2013-01-09 MED ORDER — LORAZEPAM 1 MG PO TABS: 1.0000 mg | ORAL_TABLET | Freq: Two times a day (BID) | ORAL | Status: DC

## 2013-01-09 MED ORDER — LORAZEPAM 1 MG PO TABS: 1.0000 mg | ORAL_TABLET | Freq: Every day | ORAL | Status: DC | PRN

## 2013-01-09 NOTE — Progress Notes (Signed)
Patient ID: Brent Sawa., male   DOB: [birth year], 67 y.o.   MRN: [id number] Anxiety-- needs lorazepam refilled  CAD-- no sxs  DM- poorly controlled. CBGs- he is seeing someone at [place]. CBGs nearly all > 200. Lantus 55 units a.m. He is convinced that he has no control over his blood sugar with diet or exercise. a1c >11  Reviewed pmh, psh, sochx   patient denies chest pain, shortness of breath, orthopnea. Denies lower extremity edema, abdominal pain, change in appetite, change in bowel movements. Patient denies rashes, musculoskeletal complaints. No other specific complaints in a complete review of systems.    well-developed well-nourished male in no acute distress. HEENT exam atraumatic, normocephalic, neck supple without jugular venous distention. Chest clear to auscultation cardiac exam S1-S2 are regular. Abdominal exam overweight with bowel sounds, soft and nontender. Extremities no edema. Neurologic exam is alert with a normal gait.

## 2013-01-10 ENCOUNTER — Other Ambulatory Visit: Payer: Self-pay | Admitting: Internal Medicine

## 2013-01-10 DIAGNOSIS — E119 Type 2 diabetes mellitus without complications: Secondary | ICD-10-CM

## 2013-01-10 NOTE — Assessment & Plan Note (Signed)
He is not having any sxs Need to address risk factors. He has not been willing to adjust lifestyle

## 2013-01-10 NOTE — Assessment & Plan Note (Signed)
He desperately needs better control of Dm May refer to endocrinology Check labs Discussed need for lifestyle modification. He should never eat any animal fat, concentrated sweets.  He should limit his diet to vegetables and natural foods.  He must lose weight-- initial goal weight < 200 pounds (he will still be obese at this weight) but I suspect his blood sugars will be much better controlled

## 2013-01-10 NOTE — Assessment & Plan Note (Signed)
Check labs Continue meds for now

## 2013-01-11 ENCOUNTER — Ambulatory Visit: Payer: Medicare Other | Admitting: Internal Medicine

## 2013-01-15 DIAGNOSIS — E1139 Type 2 diabetes mellitus with other diabetic ophthalmic complication: Secondary | ICD-10-CM | POA: Diagnosis not present

## 2013-01-28 ENCOUNTER — Ambulatory Visit: Payer: Medicare Other | Admitting: Internal Medicine

## 2013-01-30 ENCOUNTER — Encounter: Payer: Self-pay | Admitting: Dietician

## 2013-01-30 ENCOUNTER — Encounter: Payer: Medicare Other | Attending: Internal Medicine | Admitting: Dietician

## 2013-01-30 VITALS — Ht 71.5 in | Wt 229.0 lb

## 2013-01-30 DIAGNOSIS — R635 Abnormal weight gain: Secondary | ICD-10-CM | POA: Diagnosis not present

## 2013-01-30 DIAGNOSIS — Z713 Dietary counseling and surveillance: Secondary | ICD-10-CM | POA: Diagnosis not present

## 2013-01-30 DIAGNOSIS — R7309 Other abnormal glucose: Secondary | ICD-10-CM | POA: Diagnosis not present

## 2013-01-30 DIAGNOSIS — E119 Type 2 diabetes mellitus without complications: Secondary | ICD-10-CM

## 2013-01-30 NOTE — Progress Notes (Signed)
Medical Nutrition Therapy:  Appt start time: 1045 end time:  1155.   Assessment:  Primary concerns today: Wants some help with what do, what to eat when I get really hungry. Recent A1C at 10.0% and has gained weight and has increased TG to 554 mg/dl.  Looking to lose weight, improve glucose levels and to feel better. Generally his diabetes is being followed at the [place] and he notes that seeing the CDE or the RD is not always an option.  Feels he needs some individual support.  MEDICATIONS: Med review completed.  Using Novolog insulin 3 units before each meal.  Lantus insulin at 55 units in the AM and 30 units in the PM  BLOOD GLUCOSE MONITORING: Does not have meter or glucose log with him today.  Fasting: 97, 100-120 mg  2-3 units of Novolog   AC Lunch: About 100-130 mg  2-3 units of Novolog  AC Dinner: About 100-130  Mg 2-3 units of Novolog   DILATED EYE EXAM: Recently completed.  DAILY FOOT SELF-EXAM:  Completing daily.   DIETARY INTAKE:  Usual eating pattern includes 3 meals and 1-2 snacks per day.  Everyday foods include variety of foods.  Avoided foods include currently trying to avoid concentrated sweets.    24-hr recall:  B ( AM): 8:00 2 slices of ww toast and 2 [place] sausages OR Mordche Hedglin add an egg to meal.  Coffee with splenda and low fat half and half. OR cheerioes 2 cuups with strawberries/banana  (1/2)and skim milk 6 oz. Snk ( AM): none  L ( PM): 12:30 [place] breast sandwich (ww bread 2 slices, lite mayo and slice swiss cheese) diet coke, 5-6 sun chips,  Snk ( PM): hungry prior to dinner.  Uses a pack of the Nab crackers OR an apple  D ( PM): 6:30 tacos hard shell 3 (lettuce, tomato, low fat cheese, sauce with ground beef ) 1/2 cup of refried beans, water Snk ( PM): Depends, sometimes will have microwave the Smart Pop OR Ikeisha Blumberg have 1/2 [place] breast sandwich or a little cereal. Beverages: water, coffee, diet soda.  Usual physical activity: treadmill will walk now (had aching  feet, has orthoratics) will walk 2.5 miles for 45 minutes for about every 2 days to every 3 days).  Will decrease   the glucose to 92, 97, 101 mg.  Estimated energy needs:  HT: 71.5 in  WT: 229.9  BMI: 31.6 kg/m2  Adj WT:192 lb (87 kg)  1700-1800 calories 175 g carbohydrates 130-135 g protein 56-58 g fat  Progress Towards Goal(s):  In progress.   Nutritional Diagnosis:  [place]-2.1 Inpaired nutrition utilization As related to glucose.  As evidenced by diagnosis of diabetes for 10 years or greater, A1C recently at 10.0%, insulin for glucose control with only recent improved glucose control.    Intervention:  Nutrition/Diabetes  Continue to check your glucose when you are "real hungry"  If glucose is high, hydrate with water (12-16 oz)and maybe some of the carrots and celery, radishes and get on the treadmill.  Snacks at 15-30 gm of carb and always add a lean or low fat protein source to the snack.  Continue regular meals and snacks.  Plan for 45-60 gm of Carb for meals.  Use food labels when available. Aim for fiber in all foods when appropriate. Bread aim for 2 gm fiber per slice. Cereals aim for 3+ gm of fiber per serving.  Try to sugar at the (0-9 gm) per serving.   Protein  does not go to glucose.   Aim for a protein serving at all meals and snacks.   Aim for lean protein foods.   Bake, broil, grill, roast, steam, stew. Avoid frying.   Meal protein serving is size of palm of the hand.   Snack protein is 1-2 oz   Fats: do not go to glucose  Monitor portion sized and use the food label as a guide.   Aim for 2-3 servings of added fat at meals and 1 serving at snacks.   Use more of the monounsaturated fats.   Non-starchy vegetables:   Low in carb and calories.   High in fiber and vitamins, minerals, anti-oxidants.  These are free. Have multiple servings at meals and through out the day.  Carbohydrates go to glucose.  Aim for program of 45-60 gm of carb per meal. Can use the  menu suggestions.  Aim for 15 gm of carb for snacks and plan to add a lean protein serving  Use your Carb Counting guide for serving sizes for starches, fruits and the combination foods.  Plan to begin exercising when the MD clears you to exercise. Start low and slow. If weight loss and glucose are not getting to your goal, consider following up with Pincus Large RD,CDE in the next 12-16 weeks  Handouts given during visit include:  Living Well with Diabetes  Controlling Blood Glucose  Novo Nordisk Carb Counting Guide  Yellow Card with Diet Prescription and Exchange list  Monitoring/Evaluation:  Dietary intake, exercise, blood glucose levels, and body weight in 12 weeks with Bev Paddock CDE.

## 2013-01-30 NOTE — Patient Instructions (Addendum)
   Continue to check your glucose when you are "real hungry"  If glucose is high, hydrate with water (12-16 oz)and maybe some of the carrots and celery, radishes and get on the treadmill.  Snacks at 15-30 gm of carb and always add a lean or low fat protein source to the snack.  Continue regular meals and snacks.  Plan for 45-60 gm of Carb for meals.  Use food labels when available. Aim for fiber in all foods when appropriate. Bread aim for 2 gm fiber per slice. Cereals aim for 3+ gm of fiber per serving.  Try to sugar at the (0-9 gm) per serving.   Protein does not go to glucose.   Aim for a protein serving at all meals and snacks.   Aim for lean protein foods.   Bake, broil, grill, roast, steam, stew. Avoid frying.   Meal protein serving is size of palm of the hand.   Snack protein is 1-2 oz   Fats: do not go to glucose  Monitor portion sized and use the food label as a guide.   Aim for 2-3 servings of added fat at meals and 1 serving at snacks.   Use more of the monounsaturated fats.   Non-starchy vegetables:   Low in carb and calories.   High in fiber and vitamins, minerals, anti-oxidants.  These are free. Have multiple servings at meals and through out the day.  Carbohydrates go to glucose.  Aim for program of 45-60 gm of carb per meal. Can use the menu suggestions.  Aim for 15 gm of carb for snacks and plan to add a lean protein serving  Use your Carb Counting guide for serving sizes for starches, fruits and the combination foods.  Plan to begin exercising when the MD clears you to exercise. Start low and slow. If weight loss and glucose are not getting to your goal, consider following up with Pincus Large RD,CDE in the next 12-16 weeks

## 2013-01-31 ENCOUNTER — Ambulatory Visit: Payer: Medicare Other | Admitting: Internal Medicine

## 2013-02-08 ENCOUNTER — Encounter: Payer: Self-pay | Admitting: Internal Medicine

## 2013-02-08 ENCOUNTER — Ambulatory Visit (INDEPENDENT_AMBULATORY_CARE_PROVIDER_SITE_OTHER): Payer: Medicare Other | Admitting: Internal Medicine

## 2013-02-08 VITALS — BP 134/62 | HR 88 | Temp 98.0°F | Resp 12 | Ht 71.5 in | Wt 227.0 lb

## 2013-02-08 DIAGNOSIS — E119 Type 2 diabetes mellitus without complications: Secondary | ICD-10-CM

## 2013-02-08 MED ORDER — V-GO 40 KIT: 1.0000 | PACK | Freq: Every day | Status: DC

## 2013-02-08 MED ORDER — INSULIN ASPART 100 UNIT/ML ~~LOC~~ SOLN: SUBCUTANEOUS | Status: DC

## 2013-02-08 NOTE — Progress Notes (Signed)
Patient ID: Brent Zinn., male   DOB: 03-22-1946, 67 y.o.   MRN: [id number]  HPI: Brent [name]. is a 67 y.o.-year-old male, referred by his PCP, Dr.Swords, for management of DM2, insulin-dependent, uncontrolled, with complications (coronary artery disease, peripheral neuropathy). He was told he might have developed it from [place] (agent [place]).  He gets his meds from the [place] (needs his Rx printed).  Patient has been diagnosed with diabetes in 2003; he added Lantus 3 years ago insulin before. Last hemoglobin A1c was: Lab Results  Component Value Date   HGBA1C 10.0* 01/09/2013  Prev. 11%.   Pt is on a regimen of: - Metformin 1000 mg po bid - Lantus 55 units in am and 30 in hs - Novolog SSI tid ac 150-200: +2  201-250: +4 251-300: +6 301-350: +8 >350: 10  Pt checks his sugars 4 a day and they are: - am: 96-230 - before lunch 88 (lowest, x1) - 190s.  - after lunch: 140-220 - before dinner: 97-196 - After dinner 145-268 - Bedtime 117-233 No lows. Lowest sugar was 88 in last 3 mo. She also had a 93 and a 97, usually the low sugars are obtained after exercise; he has hypoglycemia awareness at 80. Highest sugar was 330.  Pt's meals are: - Breakfast: cereal with fruit or [place] sausage with eggs, toasttoast - Lunch: [place] or ham sandwich - Dinner: salads, ham, beef, baked potatoes, vegetables - Snacks: protein bars or nuts, carrots He powerwalks on treadmill: 1h 3.25 miles 3-4x a week.  He had cortisone inj. 3 years ago. He had a total of 2 injections.   Pt does not have chronic kidney disease, last BUN/creatinine was:  Lab Results  Component Value Date   BUN 16 01/09/2013   CREATININE 0.9 01/09/2013   Last set of lipids: Lab Results  Component Value Date   CHOL 194 01/09/2013   HDL 25.50* 01/09/2013   LDLCALC 25       02/14/2011   LDLDIRECT 99.9 01/09/2013   TRIG 554.0 Triglyceride is over 400; calculations on Lipids are invalid.* 01/09/2013   CHOLHDL 8 01/09/2013    Pt's last eye exam was in 3 weeks ago (no DR, reportedly) - Dr Emily Filbert. No DR. Has numbness and tingling in his legs - saw [place] [facility] - has new insoles, pain better with these.  PMH: I reviewed his chart and he also has a history of hyperlipidemia, hypertension, anxiety (on Ativan), kidney stones history of hydronephrosis, OSA-on CPAP, history of right distal radius fracture, colon polyps.  Pt has FH of DM in mother dx at 49 y/o.  ROS: Constitutional: + weight loss, no fatigue, ,increased appetite no subjective hyperthermia/hypothermia, increased urination Eyes: no blurry vision, no xerophthalmia ENT: no sore throat, no nodules palpated in throat, no dysphagia/odynophagia, no hoarseness Cardiovascular: no CP/SOB/palpitations/leg swelling Respiratory: no cough/SOB Gastrointestinal: no N/V/D/C Musculoskeletal: + muscle aches/no joint aches Skin: no rashes Neurological: no tremors/numbness/tingling/dizziness Psychiatric: + depression/anxiety Low libido, difficulty with erections  Past Surgical History  Procedure Laterality Date  . Coronary stent placement      stenting of the right coronary artery with followup rotational  atherectomy.   . Cholecystectomy    . Coronary angioplasty    . Orthopedic surgery    . Rotator cuff surg      Bil  . Inguinal hernia repair      right  . Foot surgery      right  . Finger surgery  right   History   Social History  . Marital Status: Married    Spouse Name: N/A    Number of Children: 0   Occupational History  . Retired   Social History Main Topics  . Smoking status: Former Smoker -- 1.50 packs/day for 30 years    Types: Cigarettes    Quit date: 1988  . Smokeless tobacco: Never Used  . Alcohol Use: Beer or bourbon daily, 1-2 drinks  . Drug Use: No   Medication Sig  . aspirin 81 MG tablet Take 81 mg by mouth daily.  Marland Kitchen atorvastatin (LIPITOR) 40 MG tablet Take 40 mg by mouth daily.  . fenofibrate 160 MG tablet Take 160 mg  by mouth daily.  . fluticasone (FLONASE) 50 MCG/ACT nasal spray Place 2 sprays into the nose daily.  . insulin aspart (NOVOLOG) 100 UNIT/ML injection 8 units with each meal but follow sliding scale before supper (dinner)  . insulin glargine (LANTUS) 100 UNIT/ML injection Inject 55 units in the am, 30 units at bedtime  . lisinopril (PRINIVIL,ZESTRIL) 5 MG tablet Take 5 mg by mouth daily.    Marland Kitchen LORazepam (ATIVAN) 1 MG tablet Take 1 tablet (1 mg total) by mouth daily as needed for anxiety.  . metFORMIN (GLUCOPHAGE) 1000 MG tablet Take 1,000 mg by mouth 2 (two) times daily with a meal.  . Multiple Vitamins-Minerals (CENTRUM SILVER PO) 1 tab po qd   . sertraline (ZOLOFT) 100 MG tablet Take 100 mg by mouth daily.   . STUDY MEDICATION Take 1 tablet by mouth daily. Accelerate Study Drug (to raise HDL) Subject ID# [id number] [facility] Dr Olga Millers  . vitamin B-12 (CYANOCOBALAMIN) 1000 MCG tablet Take 1,000 mcg by mouth daily.    Marland Kitchen zolpidem (AMBIEN) 10 MG tablet Take 10 mg by mouth at bedtime as needed. For insomnia   No current facility-administered medications on file prior to visit.   Allergies  Allergen Reactions  . Penicillins     REACTION: unspecified   Family History  Problem Relation Age of Onset  . Liver cancer Father   . Lung cancer Father   . Cancer Father   . Heart disease Father   . Heart disease Sister   . Lung cancer Mother   . COPD Mother   . Cancer Mother   . Obesity Mother    PE: BP 134/62  Pulse 88  Temp(Src) 98 F (36.7 C) (Oral)  Resp 12  Ht 5' 11.5" (1.816 m)  Wt 227 lb (102.967 kg)  BMI 31.22 kg/m2  SpO2 97% Wt Readings from Last 3 Encounters:  02/08/13 227 lb (102.967 kg)  01/30/13 229 lb (103.874 kg)  01/09/13 233 lb (105.688 kg)   Constitutional: overweight, in NAD Eyes: PERRLA, EOMI, no exophthalmos ENT: moist mucous membranes, no thyromegaly, no cervical lymphadenopathy Cardiovascular: RRR, No MRG Respiratory: CTA B Gastrointestinal:  abdomen soft, NT, ND, BS+ Musculoskeletal: no deformities, strength intact in all 4 Skin: moist, warm, no rashes Neurological: no tremor with outstretched hands, DTR normal in all 4  ASSESSMENT: 1. DM2, insulin-dependent, uncontrolled, with complications - coronary artery disease, history of MI - Peripheral neuropathy  PLAN:  1. I reviewed patient's sugar log along with him, and we discussed about ways to improve his basal-bolus regimen, as for now, he is on a very large dose of basal insulin, with a small amount of insulin from bolusing. This predisposes him to lows, or if he will suboptimal control.  - we discussed about decreasing  the Lantus and increasing the bolusing, and I suggested the following: Please decrease the Lantus dose to 30 units twice a day. Start Novolog 6 units with every meal, except take only 4 units when you prepare to exercise after the meal. Add the following sliding scale of Novolog; - 150-175: + 2 units - 176-200: + 4 units - 201-225: + 6 units - 226-250: + 8 units - >250: + 10 units - Will continue the metformin at 1000 mg twice a day - we also discussed about dietary changes, especially in snacking, and I made some suggestions about low calorie, low fat snacks, the would have a lower impact on his sugars - I believe that he would be a great candidate for a VGo mechanical pump, we can start with the VGo 40, and give 3 clicks per meal for bolusing for now - He has an appointment at the [facility] next week, he will take his prescriptions for the VGo 40 system and for the NovoLog insulin with him to see if the [place] can provide him with the system. If not, he can still get them if these are covered by Medicare. He has Medicare and supplemental [employer].  - given foot care handout and explained the principles - given instructions for hypoglycemia management "15-15 rule" - For now we'll continue with the above insulin changes, and now with see him back, with his log, in  a month

## 2013-02-08 NOTE — Patient Instructions (Addendum)
Please return in 1 month with your sugar log.  Please decrease the Lantus dose to 30 units twice a day. Start Novolog 6 units with every meal, except take only 4 units when you prepare to exercise after the meal. Add the following sliding scale of Novolog; - 150-175: + 2 units - 176-200: + 4 units - 201-225: + 6 units - 226-250: + 8 units - >250: + 10 units Please return in a month with your sugar log.  I think you would greatly benefit from a VGo system - likely VGo 40. Please ask if the [facility] or your Medicare would cover this.    PATIENT INSTRUCTIONS FOR TYPE 2 DIABETES:  **Please join MyChart!** - see attached instructions about how to join   DIET AND EXERCISE Diet and exercise is an important part of diabetic treatment.  We recommended aerobic exercise in the form of brisk walking (working between 40-60% of maximal aerobic capacity, similar to brisk walking) for 150 minutes per week (such as 30 minutes five days per week) along with 3 times per week performing 'resistance' training (using various gauge rubber tubes with handles) 5-10 exercises involving the major muscle groups (upper body, lower body and core) performing 10-15 repetitions (or near fatigue) each exercise. Start at half the above goal but build slowly to reach the above goals. If limited by weight, joint pain, or disability, we recommend daily walking in a swimming pool with water up to waist to reduce pressure from joints while allow for adequate exercise.    BLOOD GLUCOSES Monitoring your blood glucoses is important for continued management of your diabetes. Please check your blood glucoses 2-4 times a day: fasting, before meals and at bedtime (you can rotate these measurements - e.g. one day check before the 3 meals, the next day check before 2 of the meals and before bedtime, etc.   HYPOGLYCEMIA (low blood sugar) Hypoglycemia is usually a reaction to not eating, exercising, or taking too much insulin/ other  diabetes drugs.  Symptoms include tremors, sweating, hunger, confusion, headache, etc. Treat IMMEDIATELY with 15 grams of Carbs:   4 glucose tablets    cup regular juice/soda   2 tablespoons raisins   4 teaspoons sugar   1 tablespoon honey Recheck blood glucose in 15 mins and repeat above if still symptomatic/blood glucose <100. Please contact our office at [phone] if you have questions about how to next handle your insulin.  RECOMMENDATIONS TO REDUCE YOUR RISK OF DIABETIC COMPLICATIONS: * Take your prescribed MEDICATION(S). * Follow a DIABETIC diet: Complex carbs, fiber rich foods, heart healthy fish twice weekly, (monounsaturated and polyunsaturated) fats * AVOID saturated/trans fats, high fat foods, >2,300 mg salt per day. * EXERCISE at least 5 times a week for 30 minutes or preferably daily.  * DO NOT SMOKE OR DRINK more than 1 drink a day. * Check your FEET every day. Do not wear tightfitting shoes. Contact us if you develop an ulcer * See your EYE doctor once a year or more if needed * Get a FLU shot once a year * Get a PNEUMONIA vaccine once before and once after age 10 years  GOALS:  * Your Hemoglobin A1c of <7%  * Your Systolic BP should be 140 or lower  * Your Diastolic BP should be 80 or lower  * Your HDL (Good Cholesterol) should be 40 or higher  * Your LDL (Bad Cholesterol) should be 100 or lower  * Your Triglycerides should be 150 or  lower  * Your Urine microalbumin (kidney function) should be <30 * Your Body Mass Index should be 25 or lower   We will be glad to help you achieve these goals. Our telephone number is: [phone].

## 2013-02-20 ENCOUNTER — Telehealth: Payer: Self-pay | Admitting: *Deleted

## 2013-02-20 NOTE — Telephone Encounter (Signed)
Pt called stating that he has novolog pens that was prescribed to him and he is now unable to use them. Pt wants to know if he can trade them in for novolog vials with [place]? Also, pt wants to know if he should add another click (bolus) at bedtime. Pt is checking his bg before bed. Please advise.

## 2013-02-20 NOTE — Telephone Encounter (Signed)
Called pt and lvm that we can exchange them. Regarding the extra bolus at bedtime, it all depends on sugars then... If sugars at bedtime are consistently high, he likely needs an extra bolus with dinner. Dr Elvera Lennox really needs to know what his sugars are before deciding on amount. I asked the patient to call me back and let me what his sugar levels have been for the last few days.

## 2013-02-20 NOTE — Telephone Encounter (Signed)
Yes, we can exchange them. Regarding the extra bolus at bedtime, it all depends on sugars then... If sugars at bedtime are consistently high, he likely needs an extra bolus with dinner. I really need to know his sugars before deciding. Can he dictate them to you for the last few days? Thank you.

## 2013-03-01 ENCOUNTER — Telehealth: Payer: Self-pay | Admitting: *Deleted

## 2013-03-01 NOTE — Telephone Encounter (Signed)
Called patient and we discussed about his sugars. His sugars are tending up towards the end of the day, in the 170s before dinner and 200s before bedtime. Last night was an exception, when his CBG before bedtime increased to 300s. Thinking back, for dinner, he had a sub with sweet Teriyaki chicken, and thinks this was probably the reason. He is wondering whether he should cover his bedtime sugars with insulin. I advised him not to do this, but to try to avoid the high sugars before bedtime by increasing his dinnertime insulin. Because his sugars before dinner were also high, in the 170s for the last 3 days, I advised him to increase his rapid acting insulin with lunch and dinner by 2 units. His new regimen will be: - Lantus 30 units twice a day - NovoLog 6-8-8, plus correction I advised him to call me again with his sugars in a week.  Regarding his VGo system, he tells me that the [place] will approve it if recommended by a [place] endocrinologist. He will have an appointment in July. I advised him to take his sugar log from the time when he was on the VGo 40 system to show it to the [place] endocrinologist. His sugars were doing much better then. Until then, he will stay on the above regimen.

## 2013-03-01 NOTE — Telephone Encounter (Signed)
Pt called and lvm stating that he has decided not to go on the Ve-go because of cost. Pt states yesterday his glucose readings were: 144 at breakfast, 139 at lunch, 175 at dinner and 335 at bedtime. Pt received my previous message about needing the extra bolus at dinner. Pt is unsure what to do at this point. Please advise. Thank you.

## 2013-03-14 ENCOUNTER — Ambulatory Visit (INDEPENDENT_AMBULATORY_CARE_PROVIDER_SITE_OTHER): Payer: Medicare Other | Admitting: Cardiovascular Disease

## 2013-03-14 ENCOUNTER — Encounter: Payer: Self-pay | Admitting: Cardiovascular Disease

## 2013-03-14 VITALS — BP 132/66 | HR 80 | Ht 71.0 in | Wt 223.0 lb

## 2013-03-14 DIAGNOSIS — E785 Hyperlipidemia, unspecified: Secondary | ICD-10-CM | POA: Diagnosis not present

## 2013-03-14 DIAGNOSIS — I2510 Atherosclerotic heart disease of native coronary artery without angina pectoris: Secondary | ICD-10-CM

## 2013-03-14 NOTE — Progress Notes (Signed)
History of Present Illness: 67 yo male with history of CAD, HLD, DM, OSA who is here today for cardiac follow up. He has been followed in the past by Dr. Riley Kill. He had angioplasty of his RCA in 1990. He had a bare metal stent placed in the RCA in 2000 followed by rotational atherectomy shortly after for stent restenosis. Last cath in May 2012 per Dr. Riley Kill with stable moderate diffuse CAD. (70% mid LAD, 80% diagonal, 70% Ramus, 40% mid to distal RCA stent restenosis). He has done well with medical management over the last ten years. He is seeing an endocrinologist for his DM. He is on CPAP and see Dr. Shelle Iron.   He is here today for follow up. He is feeling well. No chest pain or SOB. More energy since he has had his blood sugars controlled. He works in the yard and around [employer]. He has had a broken toe and back pain so his activity level has been limited lately.   Primary Care Physician: Birdie Sons  Last Lipid Profile:Lipid Panel     Component Value Date/Time   CHOL 194 01/09/2013 0949   TRIG 554.0 Triglyceride is over 400; calculations on Lipids are invalid.* 01/09/2013 0949   HDL 25.50* 01/09/2013 0949   CHOLHDL 8 01/09/2013 0949   VLDL 110.8* 01/09/2013 0949   LDLCALC  Value: 25        Total Cholesterol/HDL:CHD Risk Coronary Heart Disease Risk Table                     Men   Women  1/2 Average Risk   3.4   3.3  Average Risk       5.0   4.4  2 X Average Risk   9.6   7.1  3 X Average Risk  23.4   11.0        Use the calculated Patient Ratio above and the CHD Risk Table to determine the patient's CHD Risk.        ATP III CLASSIFICATION (LDL):  <100     mg/dL   Optimal  161-096  mg/dL   Near or Above                    Optimal  130-159  mg/dL   Borderline  045-409  mg/dL   High  >811     mg/dL   Very High 06/02/7828 5621     Past Medical History  Diagnosis Date  . CAD (coronary artery disease)   . Myocardial infarction   . HTN (hypertension)   . Hyperlipidemia   . Obesity   . Diabetes  mellitus   . Fatigue   . OSA (obstructive sleep apnea)   . Persistent disorder of initiating or maintaining sleep   . Erectile dysfunction   . Nephrolithiasis   . Diverticulosis   . Hemorrhoids   . Personal history of colonic polyps 02/05/2003  . Kidney stones     Past Surgical History  Procedure Laterality Date  . Coronary stent placement      stenting of the right coronary artery with followup rotational  atherectomy.   . Cholecystectomy    . Coronary angioplasty    . Orthopedic surgery    . Rotator cuff surg      Bil  . Inguinal hernia repair      right  . Foot surgery      right  . Finger surgery  right    Current Outpatient Prescriptions  Medication Sig Dispense Refill  . aspirin 81 MG tablet Take 81 mg by mouth daily.      Marland Kitchen atorvastatin (LIPITOR) 40 MG tablet Take 40 mg by mouth daily.      . fenofibrate 160 MG tablet Take 160 mg by mouth daily.      . fluticasone (FLONASE) 50 MCG/ACT nasal spray Place 2 sprays into the nose daily.  48 g  1  . insulin aspart (NOVOLOG) 100 UNIT/ML injection 76 units/day for the VGo system.  3 vial  2  . insulin aspart (NOVOLOG) 100 UNIT/ML injection Inject 100 Units into the skin. 6 units before breakfast, 6 units before lunch and 8 units before supper (dinner) on a sliding scale.      . insulin glargine (LANTUS) 100 UNIT/ML injection Inject 20-50 Units into the skin 2 (two) times daily. 30 units in the am, 30 units at bedtime      . Insulin Pump Disposable (V-GO 40) KIT 1 each by Does not apply route daily.  30 kit  3  . lisinopril (PRINIVIL,ZESTRIL) 5 MG tablet Take 5 mg by mouth daily.        Marland Kitchen LORazepam (ATIVAN) 1 MG tablet Take 1 tablet (1 mg total) by mouth daily as needed for anxiety.  90 tablet  1  . metFORMIN (GLUCOPHAGE) 1000 MG tablet Take 1,000 mg by mouth 2 (two) times daily with a meal.      . Multiple Vitamins-Minerals (CENTRUM SILVER PO) 1 tab po qd       . sertraline (ZOLOFT) 100 MG tablet Take 100 mg by mouth  daily.       . STUDY MEDICATION Take 1 tablet by mouth daily. Accelerate Study Drug (to raise HDL) Subject ID# [id number] [facility] Dr Olga Millers      . vitamin B-12 (CYANOCOBALAMIN) 1000 MCG tablet Take 1,000 mcg by mouth daily.        Marland Kitchen zolpidem (AMBIEN) 10 MG tablet Take 10 mg by mouth at bedtime as needed. For insomnia       No current facility-administered medications for this visit.    Allergies  Allergen Reactions  . Penicillins     REACTION: unspecified    History   Social History  . Marital Status: Married    Spouse Name: N/A    Number of Children: N/A  . Years of Education: N/A   Occupational History  . Not on file.   Social History Main Topics  . Smoking status: Former Smoker -- 1.50 packs/day for 30 years    Types: Cigarettes    Quit date: 09/19/1980  . Smokeless tobacco: Never Used  . Alcohol Use: 0.5 oz/week    1 drink(s) per week     Comment: social  . Drug Use: No  . Sexually Active: Not on file   Other Topics Concern  . Not on file   Social History Narrative  . No narrative on file    Family History  Problem Relation Age of Onset  . Liver cancer Father   . Lung cancer Father   . Cancer Father   . Heart disease Father   . Heart disease Sister   . Lung cancer Mother   . COPD Mother   . Cancer Mother   . Obesity Mother     Review of Systems:  As stated in the HPI and otherwise negative.   BP 132/66  Pulse 80  Ht 5'  11" (1.803 m)  Wt 223 lb (101.152 kg)  BMI 31.12 kg/m2  Physical Examination: General: Well developed, well nourished, NAD HEENT: OP clear, mucus membranes moist SKIN: warm, dry. No rashes. Neuro: No focal deficits Musculoskeletal: Muscle strength 5/5 all ext Psychiatric: Mood and affect normal Neck: No JVD, no carotid bruits, no thyromegaly, no lymphadenopathy. Lungs:Clear bilaterally, no wheezes, rhonci, crackles Cardiovascular: Regular rate and rhythm. No murmurs, gallops or rubs. Abdomen:Soft. Bowel  sounds present. Non-tender.  Extremities: No lower extremity edema. Pulses are 2 + in the bilateral DP/PT.  Cardiac cath 02/06/11: 1. Ventriculography done in the RAO projection reveals preserved  global systolic function. No definite segmental wall motion  abnormalities are identified. There may be trace mitral  regurgitation, but none of hemodynamic significance.  2. The left main is slightly tapered, but without significant focal  narrowing.  3. The LAD is fairly heavily calcified. There is an eccentric plaque  of approximately 75% noted in the midvessel. This is just after  the septal and overlying the takeoff of the small first diagonal.  The small first diagonal has about 80% narrowing, unchanged from  the previous study. The mid and distal LAD has about 50%  narrowing, is calcified, but is largely unchanged from prior  studies and the vessel wraps the apex.  4. The circumflex is a large-caliber vessel. There is a ramus  intermedius that has 70-75% proximal segmental plaque. The vessel  beyond this is widely patent. When compared carefully to the  previous study, there does not appear to be a substantial change.  The AV circumflex is a large-caliber vessel without significant  focal narrowing. It is also calcified.  5. The right coronary artery has been previously stented. There is a  long stent in the mid vessel extending down to the distal vessel  overlapping the PDA takeoff, and extending into the posterolateral  segment across the first PLL-1. The stent has been previously  opened, then subsequently rotobladed. The vessel remains widely  patent with about 40% diffuse in-stent re-narrowing, but without  significant focal narrowing.  CONCLUSION:  1. Well-preserved left ventricular systolic function.  2. Continued 70-75% eccentric stenosis of the left anterior descending  artery, largely unchanged from 2003.  3. 70-75% proximal ramus intermedius stenosis, again appears  unchanged  from 2003.  4. Prior extensive stenting of the right coronary artery with  subsequent treatment with rotational atherectomy with continued  patency.   Assessment and Plan:   1. CAD: Known moderate CAD. Stable at this time. Continue ASA, statin, Ace-inh.   .        2. HYPERLIPIDEMIA: Currently in HDL raising trial with CTEP inhibitor. He is on a statin and fenofibrate.

## 2013-03-14 NOTE — Patient Instructions (Addendum)
Your physician wants you to follow-up in:  6 months. You will receive a reminder letter in the mail two months in advance. If you don't receive a letter, please call our office to schedule the follow-up appointment.   

## 2013-03-15 ENCOUNTER — Encounter: Payer: Self-pay | Admitting: Internal Medicine

## 2013-03-15 ENCOUNTER — Ambulatory Visit (INDEPENDENT_AMBULATORY_CARE_PROVIDER_SITE_OTHER): Payer: Medicare Other | Admitting: Internal Medicine

## 2013-03-15 VITALS — BP 122/72 | HR 78 | Temp 98.5°F | Resp 10 | Ht 71.5 in | Wt 224.0 lb

## 2013-03-15 DIAGNOSIS — E119 Type 2 diabetes mellitus without complications: Secondary | ICD-10-CM

## 2013-03-15 NOTE — Patient Instructions (Signed)
Please decrease the Lantus dose to 25 units in am and 30 units in HS. If sugars in am <100 after this, can decrease evening Lantus to 25, too. Increase Novolog to 04-26-09 units with each meal Continue the following sliding scale of Novolog; - 150-175: + 2 units - 176-200: + 4 units - 201-225: + 6 units - 226-250: + 8 units - >250: + 10 units - continue the metformin at 1000 mg twice a day  Please return in 2 months.

## 2013-03-15 NOTE — Progress Notes (Signed)
Patient ID: Brent [name]., male   DOB: 1946-09-12, 67 y.o.   MRN: [id number]  HPI: Brent [name]. is a 67 y.o.-year-old male, referred by his PCP, Dr.Swords, for management of DM2, insulin-dependent, uncontrolled, with complications (coronary artery disease, peripheral neuropathy). He was told he might have developed it from [place] (agent [place]). He gets his meds from the [place] (needs his Rx printed).  Patient has been diagnosed with diabetes in 2003; he added Lantus 3 years ago insulin before. Last hemoglobin A1c was: Lab Results  Component Value Date   HGBA1C 10.0* 01/09/2013  Prev. 11%.   He hurt his back 1 mo ago (for 2 weeks after he was here last ) >> he could not exercise, then broke his L toe >> barely walks.   Since her last visit, we have tried him on the VGo 40 with 6 units from bolusing with each meal, he did great, with improved sugars on drastically reduced number of units of insulin. However, he mentions that he cannot afford this, and he hopes that this can be prescribed by his doctor at the [place] so that he can be covered by [place].  Pt is on a regimen of: - Metformin 1000 mg po bid - Lantus 30 units bid, decreased from 55 in am and 30 in HS - Start Novolog 6-6-8 + sliding scale of Novolog: - 150-175: + 2 units - 176-200: + 4 units - 201-225: + 6 units - 226-250: + 8 units - >250: + 10 units  Pt checks his sugars 4 a day and they are: - am: 96-230 >> 120-172 now - before lunch 88 (lowest, x1) - 190s >> 109-174 - after lunch: 140-220 >>not checking - before dinner: 97-196 >> 147-275 (but rarely >200) - After dinner 145-268 >> not checking - Bedtime 117-233 >> 135-231 (but rarely >200) No lows. Lowest sugar was 88 in last 3 mo.   Pt does not have chronic kidney disease, last BUN/creatinine was:  Lab Results  Component Value Date   BUN 16 01/09/2013   CREATININE 0.9 01/09/2013   Last set of lipids: Lab Results  Component Value Date   CHOL 194 01/09/2013   HDL  25.50* 01/09/2013   LDLCALC 25       02/14/2011   LDLDIRECT 99.9 01/09/2013   TRIG 554.0 Triglyceride is over 400; calculations on Lipids are invalid.* 01/09/2013   CHOLHDL 8 01/09/2013   Pt's last eye exam was 2 mo ago (no DR, reportedly) - Dr Emily Filbert. No DR. Has numbness and tingling in his legs - saw [facility] - has new insoles, pain better with these.  I reviewed pt's medications, allergies, PMH, social hx, family hx and no changes required, except as mentioned above.  PMH: I reviewed his chart and he also has a history of hyperlipidemia, hypertension, anxiety (on Ativan), kidney stones history of hydronephrosis, OSA-on CPAP, history of right distal radius fracture, colon polyps.  ROS: Constitutional: no weight loss, no fatigue, no subjective hyperthermia/hypothermia Eyes: no blurry vision, no xerophthalmia ENT: no sore throat, no nodules palpated in throat, no dysphagia/odynophagia, no hoarseness Cardiovascular: no CP/SOB/palpitations/leg swelling Respiratory: no cough/SOB Gastrointestinal: no N/V/D/C Musculoskeletal: no muscle aches/no joint aches  Skin: no rashes Neurological: no tremors/numbness/tingling/dizziness Psychiatric: no depresion/anxiety  PE: BP 122/72  Pulse 78  Temp(Src) 98.5 F (36.9 C) (Oral)  Resp 10  Ht 5' 11.5" (1.816 m)  Wt 224 lb (101.606 kg)  BMI 30.81 kg/m2  SpO2 96% Wt Readings from  Last 3 Encounters:  03/15/13 224 lb (101.606 kg)  03/14/13 223 lb (101.152 kg)  02/08/13 227 lb (102.967 kg)   Constitutional: overweight, in NAD Eyes: PERRLA, EOMI, no exophthalmos ENT: moist mucous membranes, no thyromegaly, no cervical lymphadenopathy Cardiovascular: RRR, No MRG Respiratory: CTA B Gastrointestinal: abdomen soft, NT, ND, BS+ Musculoskeletal: no deformities, strength intact in all 4 Skin: moist, warm, no rashes Neurological: no tremor with outstretched hands, DTR normal in all 4  ASSESSMENT: 1. DM2, insulin-dependent, uncontrolled, with  complications - coronary artery disease, history of MI - Peripheral neuropathy  PLAN:  1. I reviewed patient's sugar log along with him, and we discussed about improving his basal-bolus regimen, as for now, he is still on a large dose of basal insulin, with a small amount of insulin from bolusing. However, ugars are definitely improved from last time, with only few sugars in the 200s now.  - we discussed about decreasing the Lantus and increasing the bolusing further, and I suggested the following: Please decrease the Lantus dose to 25 units in am and 30 units in HS. If sugars in am <100 after this, can decrease evening Lantus to 25, too Increase Novolog to 04-26-09 units with each meal Continue the following sliding scale of Novolog; - 150-175: + 2 units - 176-200: + 4 units - 201-225: + 6 units - 226-250: + 8 units - >250: + 10 units - Will continue the metformin at 1000 mg twice a day - we also discussed about dietary changes, especially in snacking - I still think he would be a great candidate for a VGo mechanical pump, he did great on the VGo 40 + 3 clicks per meal for bolusing  [facility] - he will [facility] there about this. He has Medicare and supplemental [employer].  - I will see him back in 2 months - will check HbA1c then

## 2013-03-28 ENCOUNTER — Other Ambulatory Visit: Payer: Self-pay

## 2013-04-08 DIAGNOSIS — M5137 Other intervertebral disc degeneration, lumbosacral region: Secondary | ICD-10-CM | POA: Diagnosis not present

## 2013-04-08 DIAGNOSIS — M999 Biomechanical lesion, unspecified: Secondary | ICD-10-CM | POA: Diagnosis not present

## 2013-04-09 DIAGNOSIS — M999 Biomechanical lesion, unspecified: Secondary | ICD-10-CM | POA: Diagnosis not present

## 2013-04-09 DIAGNOSIS — M5137 Other intervertebral disc degeneration, lumbosacral region: Secondary | ICD-10-CM | POA: Diagnosis not present

## 2013-04-15 DIAGNOSIS — M999 Biomechanical lesion, unspecified: Secondary | ICD-10-CM | POA: Diagnosis not present

## 2013-04-15 DIAGNOSIS — N476 Balanoposthitis: Secondary | ICD-10-CM | POA: Diagnosis not present

## 2013-04-15 DIAGNOSIS — N200 Calculus of kidney: Secondary | ICD-10-CM | POA: Diagnosis not present

## 2013-04-15 DIAGNOSIS — M5137 Other intervertebral disc degeneration, lumbosacral region: Secondary | ICD-10-CM | POA: Diagnosis not present

## 2013-04-15 DIAGNOSIS — N402 Nodular prostate without lower urinary tract symptoms: Secondary | ICD-10-CM | POA: Diagnosis not present

## 2013-04-16 DIAGNOSIS — M999 Biomechanical lesion, unspecified: Secondary | ICD-10-CM | POA: Diagnosis not present

## 2013-04-16 DIAGNOSIS — M5137 Other intervertebral disc degeneration, lumbosacral region: Secondary | ICD-10-CM | POA: Diagnosis not present

## 2013-05-16 ENCOUNTER — Encounter: Payer: Self-pay | Admitting: Internal Medicine

## 2013-05-16 ENCOUNTER — Ambulatory Visit (INDEPENDENT_AMBULATORY_CARE_PROVIDER_SITE_OTHER): Payer: Medicare Other | Admitting: Internal Medicine

## 2013-05-16 VITALS — BP 120/60 | HR 85 | Temp 98.3°F | Resp 12 | Wt 225.0 lb

## 2013-05-16 DIAGNOSIS — E119 Type 2 diabetes mellitus without complications: Secondary | ICD-10-CM | POA: Diagnosis not present

## 2013-05-16 NOTE — Progress Notes (Signed)
Patient ID: Brent Diehl., male   DOB: Feb 07, 1946, 67 y.o.   MRN: [id number]  HPI: Brent [name]. is a 67 y.o.-year-old male, referred by his PCP, Dr.Swords, for management of DM2, dx 2003, insulin-dependent since 2011, uncontrolled, with complications (coronary artery disease, peripheral neuropathy). He was told he might have developed it from [place] (agent [place]). He gets his meds from the [place] (needs his Rx printed). Last visit 2 mo ago.  Last hemoglobin A1c was: checked at the [place]: 04/19/2013: HbA1C was 7.5%.  Lab Results  Component Value Date   HGBA1C 10.0* 01/09/2013  Prev. 11%.   Few months ago, we have tried him on a VGo 40 system, with 6 units from bolusing with each meal, he did great, with improved sugars on drastically reduced number of units of insulin. However, he mentions that he cannot afford this, and this is not covered by the [place], either.  Pt is on a regimen of: - Metformin 1000 mg po bid - Lantus 25 units in am and 30 in hs - vials - Novolog 04-26-09 - pens (can end up with 16 or 18 for dinner) + sliding scale of Novolog: - 150-175: + 2 units - 176-200: + 4 units - 201-225: + 6 units - 226-250: + 8 units - >250: + 10 units  Pt checks his sugars 4 a day and they are: - am: 96-230 >> 120-172 >> 156-274 - before lunch 88 (lowest, x1) - 190s >> 109-174 >> 119-251, mostly in the low 100s  - after lunch: 140-220 >>not checking - before dinner: 97-196 >> 147-275 (but rarely >200) >> 111-227 - After dinner 145-268 >> not checking - Bedtime 117-233 >> 135-231 (but rarely >200) >> 143-208 No lows. Lowest sugar was 111 in last month. Highest: 303 x 2.   Pt does not have chronic kidney disease, last BUN/creatinine was:  Lab Results  Component Value Date   BUN 16 01/09/2013   CREATININE 0.9 01/09/2013   Last set of lipids: .lastlip Lab Results  Component Value Date   CHOL 194 01/09/2013   HDL 25.50* 01/09/2013   LDLCALC 25       02/14/2011   LDLDIRECT 99.9  01/09/2013   TRIG 554.0 Triglyceride is over 400; calculations on Lipids are invalid.* 01/09/2013   CHOLHDL 8 01/09/2013   Pt's last eye exam was in 02/2013. mo ago (no DR, reportedly) - Dr Emily Filbert. No DR. Has numbness and tingling in his legs - saw [facility] - has new insoles, pain better with these.  I reviewed pt's medications, allergies, PMH, social hx, family hx and no changes required, except as mentioned above.  Pt also has a history of HL, HTN, anxiety (on Ativan), kidney stones history of hydronephrosis, OSA-on CPAP, history of right distal radius fracture, colon polyps.  ROS: Constitutional: no weight loss, no fatigue, no subjective hyperthermia/hypothermia Eyes: no blurry vision, no xerophthalmia ENT: no sore throat, no nodules palpated in throat, no dysphagia/odynophagia, no hoarseness Cardiovascular: no CP/SOB/palpitations/leg swelling Respiratory: no cough/SOB Gastrointestinal: no N/V/D/C Musculoskeletal: no muscle aches/no joint aches  Skin: no rashes, + easy bruising Neurological: no tremors/numbness/tingling/dizziness Psychiatric: no depresion/anxiety  PE: BP 120/60  Pulse 85  Temp(Src) 98.3 F (36.8 C) (Oral)  Resp 12  Wt 225 lb (102.059 kg)  BMI 30.95 kg/m2  SpO2 97% Wt Readings from Last 3 Encounters:  05/16/13 225 lb (102.059 kg)  03/15/13 224 lb (101.606 kg)  03/14/13 223 lb (101.152 kg)   Constitutional: overweight,  in NAD Eyes: PERRLA, EOMI, no exophthalmos ENT: moist mucous membranes, no thyromegaly, no cervical lymphadenopathy Cardiovascular: RRR, No MRG Respiratory: CTA B Gastrointestinal: abdomen soft, NT, ND, BS+ Musculoskeletal: no deformities, strength intact in all 4 Skin: moist, warm, no rashes Neurological: no tremor with outstretched hands, DTR normal in all 4  ASSESSMENT: 1. DM2, insulin-dependent, uncontrolled, with complications - coronary artery disease, history of MI - Peripheral neuropathy  PLAN:  1. Patient with  long-standing diabetes, with a much improved hemoglobin [facility]c [facility] ([facility] at the [facility] of the [facility]). I reviewed patient's sugar log along with him, and issuers are still fairly high at most times of the day, except before lunch, which are the best of the day. We discussed about improving his basal-bolus regimen, as for now, he is still on a large dose of basal insulin, with a small amount of insulin from bolusing, however, I do not see patterns, to be able to address. Upon questioning, the patient starts feeling weak and hungry around 4 PM, and he has to have a snack (usually an apple or a pack of nabs. These are driving his sugars up, therefore before dinner, his sugars are between 111 and 227. Unfortunately, he did not check his sugars at those times to see if he has hypoglycemia. I advised him to start doing so, and send me a message to my chart with these sugars. I also advised him to check before and 2 hours after a meal, for tweaking his mealtime insulin. - we we'll do the following for now: ContinueLantus dose to 25 units in am but increase to 35 units in HS.  Continue Novolog to 04-26-09 units with each meal Continue the following sliding scale of Novolog; - 150-175: + 2 units - 176-200: + 4 units - 201-225: + 6 units - 226-250: + 8 units - >250: + 10 units Will continue the metformin at 1000 mg twice a day - also increase the Lantus,  To check if he is not dropping his sugars at night, I advised him to check cbg's few times around 3 AM - he saw an endocrinologist in the [place] system, however they cannot prescribe the VGo mechanical pump, as they did not have a contract with the company. This is most unfortunate, as he did great on the VGo 40 + 3 clicks per meal for bolusing. He could not afford they for the pump out of his pocket. - I will see him back in 1 month with his sugar log

## 2013-05-16 NOTE — Patient Instructions (Signed)
Please continue with Lantus 25 units in am and 35 units at night. Continue with the same regimen of Novolog: 04-26-09 units with main meals. Continue Sliding scale. Check sugars before and 2h after same meal.  Check sugars at 4 pm when you feel week and hungry.  Please return in 1 month with your sugar log.

## 2013-05-27 ENCOUNTER — Encounter (INDEPENDENT_AMBULATORY_CARE_PROVIDER_SITE_OTHER): Payer: Self-pay

## 2013-05-27 DIAGNOSIS — R0989 Other specified symptoms and signs involving the circulatory and respiratory systems: Secondary | ICD-10-CM

## 2013-06-01 ENCOUNTER — Encounter: Payer: Self-pay | Admitting: Family

## 2013-06-01 ENCOUNTER — Ambulatory Visit (INDEPENDENT_AMBULATORY_CARE_PROVIDER_SITE_OTHER): Payer: Medicare Other | Admitting: Family

## 2013-06-01 VITALS — BP 140/80 | HR 79 | Temp 98.7°F | Resp 20 | Wt 226.0 lb

## 2013-06-01 DIAGNOSIS — R0982 Postnasal drip: Secondary | ICD-10-CM

## 2013-06-01 DIAGNOSIS — R05 Cough: Secondary | ICD-10-CM

## 2013-06-01 DIAGNOSIS — J309 Allergic rhinitis, unspecified: Secondary | ICD-10-CM | POA: Diagnosis not present

## 2013-06-01 MED ORDER — HYDROCOD POLST-CHLORPHEN POLST 10-8 MG/5ML PO LQCR: 5.0000 mL | Freq: Two times a day (BID) | ORAL | Status: DC | PRN

## 2013-06-01 NOTE — Progress Notes (Signed)
Subjective:    Patient ID: Brent Grob., male    DOB: 10/02/1945, 67 y.o.   MRN: [id number]  HPI 67 year old WM, nonsmoker, is in today with c/o cough and a tickle in his throat x 1 day. He had left over Hycodan that has expired that is not helping. Cough is nonproductive. Has a history of Type 2 DM that has been uncontrolled.    Review of Systems  Constitutional: Negative.   HENT: Positive for congestion and postnasal drip. Negative for sore throat.   Respiratory: Positive for cough.   Cardiovascular: Negative.   Musculoskeletal: Negative.   Skin: Negative.   Allergic/Immunologic: Positive for environmental allergies. Negative for food allergies.  Neurological: Negative.   Hematological: Negative.   Psychiatric/Behavioral: Negative.    Past Medical History  Diagnosis Date  . CAD (coronary artery disease)   . Myocardial infarction   . HTN (hypertension)   . Hyperlipidemia   . Obesity   . Diabetes mellitus   . Fatigue   . OSA (obstructive sleep apnea)   . Persistent disorder of initiating or maintaining sleep   . Erectile dysfunction   . Nephrolithiasis   . Diverticulosis   . Hemorrhoids   . Personal history of colonic polyps 02/05/2003  . Kidney stones     History   Social History  . Marital Status: Married    Spouse Name: N/A    Number of Children: N/A  . Years of Education: N/A   Occupational History  . Not on file.   Social History Main Topics  . Smoking status: Former Smoker -- 1.50 packs/day for 30 years    Types: Cigarettes    Quit date: 09/19/1980  . Smokeless tobacco: Never Used  . Alcohol Use: 0.5 oz/week    1 drink(s) per week     Comment: social  . Drug Use: No  . Sexual Activity: Not on file   Other Topics Concern  . Not on file   Social History Narrative  . No narrative on file    Past Surgical History  Procedure Laterality Date  . Coronary stent placement      stenting of the right coronary artery with followup rotational   atherectomy.   . Cholecystectomy    . Coronary angioplasty    . Orthopedic surgery    . Rotator cuff surg      Bil  . Inguinal hernia repair      right  . Foot surgery      right  . Finger surgery      right    Family History  Problem Relation Age of Onset  . Liver cancer Father   . Lung cancer Father   . Cancer Father   . Heart disease Father   . Heart disease Sister   . Lung cancer Mother   . COPD Mother   . Cancer Mother   . Obesity Mother     Allergies  Allergen Reactions  . Penicillins     REACTION: unspecified    Current Outpatient Prescriptions on File Prior to Visit  Medication Sig Dispense Refill  . aspirin 81 MG tablet Take 81 mg by mouth daily.      Marland Kitchen atorvastatin (LIPITOR) 40 MG tablet Take 40 mg by mouth daily.      . fenofibrate 160 MG tablet Take 160 mg by mouth daily.      . fluticasone (FLONASE) 50 MCG/ACT nasal spray Place 2 sprays into the nose daily.  48 g  1  . insulin aspart (NOVOLOG) 100 UNIT/ML injection Inject 100 Units into the skin. 8 units before breakfast, 8 units before lunch and 10 units before supper (dinner) on a sliding scale.      . insulin glargine (LANTUS) 100 UNIT/ML injection Inject 20-50 Units into the skin 2 (two) times daily. 25 units in the am, 35 units at bedtime      . lisinopril (PRINIVIL,ZESTRIL) 5 MG tablet Take 5 mg by mouth daily.        Marland Kitchen LORazepam (ATIVAN) 1 MG tablet Take 1 tablet (1 mg total) by mouth daily as needed for anxiety.  90 tablet  1  . metFORMIN (GLUCOPHAGE) 1000 MG tablet Take 1,000 mg by mouth 2 (two) times daily with a meal.      . Multiple Vitamins-Minerals (CENTRUM SILVER PO) 1 tab po qd       . sertraline (ZOLOFT) 100 MG tablet Take 100 mg by mouth daily.       . STUDY MEDICATION Take 1 tablet by mouth daily. Accelerate Study Drug (to raise HDL) Subject ID# [id number] [facility] Dr Olga Millers      . vitamin B-12 (CYANOCOBALAMIN) 1000 MCG tablet Take 1,000 mcg by mouth daily.        Marland Kitchen  zolpidem (AMBIEN) 10 MG tablet Take 10 mg by mouth at bedtime as needed. For insomnia       No current facility-administered medications on file prior to visit.    BP 140/80  Pulse 79  Temp(Src) 98.7 F (37.1 C) (Oral)  Resp 20  Wt 226 lb (102.513 kg)  BMI 31.08 kg/m2  SpO2 98%chart    Objective:   Physical Exam  Constitutional: He is oriented to person, place, and time. He appears well-developed and well-nourished.  HENT:  Right Ear: External ear normal.  Left Ear: External ear normal.  Nose: Nose normal.  Mouth/Throat: Oropharynx is clear and moist.  Neck: Normal range of motion. Neck supple.  Cardiovascular: Normal rate, regular rhythm and normal heart sounds.   Pulmonary/Chest: Effort normal and breath sounds normal. No respiratory distress. He has no wheezes.  Neurological: He is alert and oriented to person, place, and time.  Skin: Skin is warm and dry.  Psychiatric: He has a normal mood and affect.          Assessment & Plan:  Assessment: 1. Cough 2. Post Nasal Drip  3. Allergic Rhinitis  Plan: Tussinex as needed for cough twice a day. Zyrtec once a day. Call the office if symptoms worsen or persist. Recheck as needed.

## 2013-06-01 NOTE — Patient Instructions (Addendum)
1. OTC Zyrtec once daily x 7-10 days.

## 2013-06-03 ENCOUNTER — Telehealth: Payer: Self-pay | Admitting: *Deleted

## 2013-06-03 NOTE — Telephone Encounter (Signed)
Pt called stating he recently went to the [place] for an appt and spoke with them about getting a V-Go. They told him they do not have a contract with V-Go and they cannot refer for him to have it. Pt stated he understood. Then a few days later he got a call about having an insulin pump put on. Pt wanted to see if Dr Alinda Sierras feels this is a good idea. He just wanted to touch base with her before moving forward with this decision. Please advise.

## 2013-06-05 ENCOUNTER — Telehealth: Payer: Self-pay | Admitting: Internal Medicine

## 2013-06-06 ENCOUNTER — Telehealth: Payer: Self-pay | Admitting: *Deleted

## 2013-06-06 NOTE — Telephone Encounter (Signed)
This would a great idea. I would like him to see Cristy Folks or Pincus Large  to discuss with them about what a pump entails and then decide if he wants to go ahead with it. Carollee Herter, can you refer him to DM education for pump discussion?

## 2013-06-06 NOTE — Telephone Encounter (Signed)
Called pt and spoke with his wife. Advised her that Dr Elvera Lennox said the pump would a great idea. I would like him to see Cristy Folks or Pincus Large to discuss with them about what a pump entails and then decide if he wants to go ahead with it. Carollee Herter, can you refer him to DM education for pump discussion? She stated [facility] and [facility] if we would check and see what the cost was with [place] and let them know. That will the be determining factor for them. Be advised.

## 2013-06-10 NOTE — Telephone Encounter (Signed)
Sounds good

## 2013-06-10 NOTE — Telephone Encounter (Signed)
I spoke with pt's wife on the 18th and she stated that they are going to discuss this together. They will probably go through the [place] because it will not cost them anything to go there. She will advise [place] when they decide.

## 2013-06-14 ENCOUNTER — Ambulatory Visit (INDEPENDENT_AMBULATORY_CARE_PROVIDER_SITE_OTHER): Payer: Medicare Other

## 2013-06-14 DIAGNOSIS — Z23 Encounter for immunization: Secondary | ICD-10-CM | POA: Diagnosis not present

## 2013-06-20 ENCOUNTER — Ambulatory Visit (INDEPENDENT_AMBULATORY_CARE_PROVIDER_SITE_OTHER): Payer: Medicare Other | Admitting: Internal Medicine

## 2013-06-20 ENCOUNTER — Encounter: Payer: Self-pay | Admitting: Internal Medicine

## 2013-06-20 VITALS — BP 124/62 | HR 77 | Temp 98.7°F | Resp 10 | Wt 227.6 lb

## 2013-06-20 DIAGNOSIS — E119 Type 2 diabetes mellitus without complications: Secondary | ICD-10-CM | POA: Diagnosis not present

## 2013-06-20 NOTE — Progress Notes (Signed)
Patient ID: Brent Kassebaum., male   DOB: Nov 26, 1945, 67 y.o.   MRN: [id number]  HPI: Brent Pauwels. is a 67 y.o.-year-old male, referred by his PCP, Dr.Swords, for management of DM2, dx 2003, insulin-dependent since 2011, uncontrolled, with complications (coronary artery disease, peripheral neuropathy). He was told he might have developed it from [place] (agent [place]). He gets his meds from the [place] (needs his Rx printed). Last visit 1 mo ago.  Last hemoglobin A1c was: checked at the [place]: 04/12/2013: HbA1C was 7.1%.  Lab Results  Component Value Date   HGBA1C 10.0* 01/09/2013  Prev. 11%.   Few months ago, we have tried him on a VGo 40 system, with 6 units from bolusing with each meal, he did great, with improved sugars on drastically reduced number of units of insulin. However, he mentions that he cannot afford this, and this is not covered by the [place], either. However, they would cover an insulin pump, which is great news! He is excited about this.  Pt is on a regimen of: - Metformin 1000 mg po bid - Lantus 25 (<< 20) units in am and 35 (<<30) in hs - vials - Novolog 04-26-09 - pens (can end up with 16 or 18 for dinner) + sliding scale of Novolog: - 150-175: + 2 units - 176-200: + 4 units - 201-225: + 6 units - 226-250: + 8 units - >250: + 10 units  Pt checks his sugars 4 a day and brings a log that we reviewed together): - am: 96-230 >> 120-172 >> 156-274 >> 133-205 - before lunch 88 (lowest, x1) - 190s >> 109-174 >> 119-251, mostly in the low 100s >> 102-204 - after lunch: 140-220 >>not checking >> 156-188 - before dinner: 97-196 >> 147-275 (but rarely >200) >> 111-227 >> 117-304 - After dinner 145-268 >> not checking >> not checking - Bedtime 117-233 >> 135-231 (but rarely >200) >> 143-208 >> 92-263 No lows. Lowest sugar was 92 in last month. Highest: 304 x 2.   Pt does not have chronic kidney disease, last BUN/creatinine was:  Lab Results  Component Value Date   BUN 16  01/09/2013   CREATININE 0.9 01/09/2013   Last set of lipids: .lastlip Lab Results  Component Value Date   CHOL 194 01/09/2013   HDL 25.50* 01/09/2013   LDLCALC 25       02/14/2011   LDLDIRECT 99.9 01/09/2013   TRIG 554.0 Triglyceride is over 400; calculations on Lipids are invalid.* 01/09/2013   CHOLHDL 8 01/09/2013   Pt's last eye exam was in 02/2013. mo ago (no DR, reportedly) - Dr Emily Filbert. No DR. Has numbness and tingling in his legs - saw [facility] - has new insoles, pain better with these.  I reviewed pt's medications, allergies, PMH, social hx, family hx and no changes required, except as mentioned above.  Pt also has a history of HL, HTN, anxiety (on Ativan), kidney stones history of hydronephrosis, OSA-on CPAP, history of right distal radius fracture, colon polyps.  ROS: Constitutional: no weight loss, no fatigue, no subjective hyperthermia/hypothermia Eyes: no blurry vision, no xerophthalmia ENT: no sore throat, no nodules palpated in throat, no dysphagia/odynophagia, no hoarseness Cardiovascular: no CP/SOB/palpitations/leg swelling Respiratory: no cough/SOB Gastrointestinal: no N/V/D/C Musculoskeletal: no muscle aches/no joint aches  Skin: no rashes, + easy bruising Neurological: no tremors/numbness/tingling/dizziness  PE: BP 124/62  Pulse 77  Temp(Src) 98.7 F (37.1 C) (Oral)  Resp 10  Wt 227 lb 9.6 oz (103.239 kg)  BMI 31.3 kg/m2  SpO2 98% Wt Readings from Last 3 Encounters:  06/20/13 227 lb 9.6 oz (103.239 kg)  06/01/13 226 lb (102.513 kg)  05/16/13 225 lb (102.059 kg)   Constitutional: overweight, in NAD Eyes: PERRLA, EOMI, no exophthalmos ENT: moist mucous membranes, no thyromegaly, no cervical lymphadenopathy Cardiovascular: RRR, No MRG Respiratory: CTA B Gastrointestinal: abdomen soft, NT, ND, BS+ Musculoskeletal: no deformities, strength intact in all 4 Skin: moist, warm, no rashes Neurological: no tremor with outstretched hands, DTR normal in all  4  ASSESSMENT: 1. DM2, insulin-dependent, uncontrolled, with complications - coronary artery disease, history of MI - Peripheral neuropathy  PLAN:  1. Patient with long-standing diabetes, with a much improved hemoglobin A[facility]c recently ([facility] 7.[facility]% at the beginning of the month). I reviewed patient's sugar log along with him, and issuers are still fairly high at most times of the day, except before lunch, which are the best of the day. We discussed about improving his basal-bolus regimen, as for now, he is still on a large dose of basal insulin, with a small amount of insulin from bolusing, however, no patterns, to be able to address.  - we will do the following for now: Continue Lantus dose to 25 units in am but increase to 35 units in HS.  Continue Novolog to 08-30-14 units with each meal Continue the following sliding scale of Novolog; - 150-175: + 2 units - 176-200: + 4 units - 201-225: + 6 units - 226-250: + 8 units - >250: + 10 units Continue the metformin at 1000 mg twice a day - had flu vaccine last weel - he saw an endocrinologist in the [place] system, however they cannot prescribe the VGo mechanical pump, as they did not have a contract with the company. This is most unfortunate, as he did great on the VGo 40 + 3 clicks per meal for bolusing. He could not afford they for the pump out of his pocket. Pt called before the appt today [facility] >> this is great news and I referred him to DM education to get prepump training to see if he wants to go ahead with it. - I will see him back in 1.5 month with his sugar log

## 2013-06-20 NOTE — Patient Instructions (Signed)
Continue Lantus dose to 25 units in am but increase to 35 units in HS.  Continue Novolog to 08-30-14 units with each meal Continue the following sliding scale of Novolog; - 150-175: + 2 units - 176-200: + 4 units - 201-225: + 6 units - 226-250: + 8 units - >250: + 10 units Will continue the metformin at 1000 mg twice a day  Please return in 1.5 months.

## 2013-06-24 ENCOUNTER — Other Ambulatory Visit: Payer: Self-pay | Admitting: Internal Medicine

## 2013-07-01 ENCOUNTER — Ambulatory Visit (INDEPENDENT_AMBULATORY_CARE_PROVIDER_SITE_OTHER)
Admission: RE | Admit: 2013-07-01 | Discharge: 2013-07-01 | Disposition: A | Discharge status: Home / Self Care | Payer: Medicare Other | Source: Ambulatory Visit | Attending: Family Medicine | Admitting: Family Medicine

## 2013-07-01 ENCOUNTER — Ambulatory Visit (INDEPENDENT_AMBULATORY_CARE_PROVIDER_SITE_OTHER): Payer: Medicare Other | Admitting: Family Medicine

## 2013-07-01 ENCOUNTER — Encounter: Payer: Self-pay | Admitting: Family Medicine

## 2013-07-01 VITALS — BP 130/76 | HR 80 | Temp 98.2°F | Wt 230.0 lb

## 2013-07-01 DIAGNOSIS — S8000XA Contusion of unspecified knee, initial encounter: Secondary | ICD-10-CM

## 2013-07-01 DIAGNOSIS — S8990XA Unspecified injury of unspecified lower leg, initial encounter: Secondary | ICD-10-CM | POA: Diagnosis not present

## 2013-07-01 DIAGNOSIS — S8002XA Contusion of left knee, initial encounter: Secondary | ICD-10-CM

## 2013-07-01 DIAGNOSIS — M25569 Pain in unspecified knee: Secondary | ICD-10-CM | POA: Diagnosis not present

## 2013-07-01 MED ORDER — DICLOFENAC SODIUM 75 MG PO TBEC: 75.0000 mg | DELAYED_RELEASE_TABLET | Freq: Two times a day (BID) | ORAL | Status: DC

## 2013-07-01 NOTE — Progress Notes (Signed)
  Subjective:    Patient ID: Brent [name]., male    DOB: Dec 01, 1945, 67 y.o.   MRN: [id number]  HPI Here for an injury to the left knee which occurred one month ago. As he was trying to pull a tree root out of the ground the root suddenly gave way, causing his left elbow to strike his left knee quite hard. He had immediate pain in the medial knee but little swelling. He did not ice it that day. Since then it has continued to be painful and it bothers him all day when walking and especially when going up stairs. No locking or giving way.   Review of Systems  Constitutional: Negative.   Musculoskeletal: Positive for arthralgias and joint swelling.       Objective:   Physical Exam  Constitutional: He appears well-developed and well-nourished. No distress.  Musculoskeletal:  The left knee is mildly swollen on the medial side and he is very tender over the medial femoral condyle. No crepitus. Full ROM.          Assessment & Plan:  His knee is contused and we need to rule out a fracture. He will get Xrays of the knee today. Use ice and Diclofenac. If the Xrays are negative he will wear an elastic support sleeve

## 2013-07-02 ENCOUNTER — Telehealth: Payer: Self-pay | Admitting: Internal Medicine

## 2013-07-02 NOTE — Telephone Encounter (Signed)
Pt is calling to inquire about his xray results from 07/01/13. Please assist.

## 2013-07-03 NOTE — Telephone Encounter (Signed)
Pt would like xray results. Please call pt today

## 2013-07-03 NOTE — Telephone Encounter (Signed)
Spoke with pt about x-ray results and pt is aware.

## 2013-07-03 NOTE — Progress Notes (Signed)
Quick Note:  Called and spoke with pt and pt is aware. ______ 

## 2013-07-25 ENCOUNTER — Other Ambulatory Visit: Payer: Self-pay

## 2013-07-31 ENCOUNTER — Encounter: Payer: Self-pay | Admitting: Pulmonary Disease

## 2013-07-31 ENCOUNTER — Ambulatory Visit (INDEPENDENT_AMBULATORY_CARE_PROVIDER_SITE_OTHER): Payer: Medicare Other | Admitting: Pulmonary Disease

## 2013-07-31 VITALS — BP 132/80 | HR 81 | Temp 98.3°F | Ht 72.0 in | Wt 230.4 lb

## 2013-07-31 DIAGNOSIS — G4733 Obstructive sleep apnea (adult) (pediatric): Secondary | ICD-10-CM

## 2013-07-31 NOTE — Patient Instructions (Signed)
Continue with cpap, and keep up with mask changes and supplies. Work on weight reduction followup with me in one year.  

## 2013-07-31 NOTE — Progress Notes (Signed)
  Subjective:    Patient ID: Brent Resor., male    DOB: 08/26/1946, 66 y.o.   MRN: [id number]  HPI Patient comes in today for followup of his obstructive sleep apnea.  He is wearing his CPAP compliantly, and is having no issues with his mask fit or pressure.  He feels that he is sleeping well, and is satisfied with his daytime alertness.  His weight is down 4 pounds from the last visit.   Review of Systems  Constitutional: Negative for fever and unexpected weight change.  HENT: Negative for congestion, dental problem, ear pain, nosebleeds, postnasal drip, rhinorrhea, sinus pressure, sneezing, sore throat and trouble swallowing.   Eyes: Negative for redness and itching.  Respiratory: Negative for cough, chest tightness, shortness of breath and wheezing.   Cardiovascular: Negative for palpitations and leg swelling.  Gastrointestinal: Negative for nausea and vomiting.  Genitourinary: Negative for dysuria.  Musculoskeletal: Negative for joint swelling.  Skin: Negative for rash.  Neurological: Negative for headaches.  Hematological: Does not bruise/bleed easily.  Psychiatric/Behavioral: Negative for dysphoric mood. The patient is not nervous/anxious.        Objective:   Physical Exam Overweight male in no acute distress Nose without purulence or discharge noted No skin breakdown or pressure necrosis from the CPAP mask Neck without lymphadenopathy or thyromegaly Lower extremities without edema, no cyanosis Alert and oriented, moves all 4 extremities       Assessment & Plan:

## 2013-07-31 NOTE — Assessment & Plan Note (Signed)
The patient is doing well from a sleep apnea standpoint on his CPAP device.  I have asked him to keep up with his mask cushion changes and supplies, and to work aggressively on weight loss.  He will followup with me in one year.

## 2013-08-06 ENCOUNTER — Ambulatory Visit: Payer: Medicare Other | Admitting: Internal Medicine

## 2013-08-29 ENCOUNTER — Other Ambulatory Visit: Payer: Self-pay | Admitting: Pulmonary Disease

## 2013-09-03 ENCOUNTER — Telehealth: Payer: Self-pay | Admitting: Pulmonary Disease

## 2013-09-03 MED ORDER — FLUTICASONE PROPIONATE 50 MCG/ACT NA SUSP: 2.0000 | Freq: Every day | NASAL | Status: DC

## 2013-09-03 NOTE — Telephone Encounter (Signed)
Rx has been resent. Pt is aware.  

## 2013-09-04 ENCOUNTER — Encounter: Payer: Self-pay | Admitting: Cardiovascular Disease

## 2013-09-04 ENCOUNTER — Ambulatory Visit (INDEPENDENT_AMBULATORY_CARE_PROVIDER_SITE_OTHER): Payer: Medicare Other | Admitting: Cardiovascular Disease

## 2013-09-04 VITALS — BP 146/74 | HR 77 | Ht 72.0 in | Wt 229.0 lb

## 2013-09-04 DIAGNOSIS — I2510 Atherosclerotic heart disease of native coronary artery without angina pectoris: Secondary | ICD-10-CM

## 2013-09-04 DIAGNOSIS — E785 Hyperlipidemia, unspecified: Secondary | ICD-10-CM

## 2013-09-04 NOTE — Progress Notes (Signed)
History of Present Illness: 67 yo male with history of CAD, HLD, DM, OSA who is here today for cardiac follow up. He has been followed in the past by Dr. Riley Kill. He had angioplasty of his RCA in 1990. He had a bare metal stent placed in the RCA in 2000 followed by rotational atherectomy shortly after for stent restenosis. Last cath in May 2012 per Dr. Riley Kill with stable moderate diffuse CAD. (70% mid LAD, 80% diagonal, 70% Ramus, 40% mid to distal RCA stent restenosis). He has done well with medical management. He is seeing an endocrinologist for his DM. He is on CPAP and see Dr. Shelle Iron.   He is here today for follow up. He is feeling well. No chest pain or SOB. More energy since he has had his blood sugars controlled. He works in the yard and around [employer]. He had a recent knee injury.   Primary Care Physician: Birdie Sons  Last Lipid Profile:Lipid Panel     Component Value Date/Time   CHOL 194 01/09/2013 0949   TRIG 554.0 Triglyceride is over 400; calculations on Lipids are invalid.* 01/09/2013 0949   HDL 25.50* 01/09/2013 0949   CHOLHDL 8 01/09/2013 0949   VLDL 110.8* 01/09/2013 0949   LDLCALC  Value: 25        Total Cholesterol/HDL:CHD Risk Coronary Heart Disease Risk Table                     Men   Women  1/2 Average Risk   3.4   3.3  Average Risk       5.0   4.4  2 X Average Risk   9.6   7.1  3 X Average Risk  23.4   11.0        Use the calculated Patient Ratio above and the CHD Risk Table to determine the patient's CHD Risk.        ATP III CLASSIFICATION (LDL):  <100     mg/dL   Optimal  161-096  mg/dL   Near or Above                    Optimal  130-159  mg/dL   Borderline  045-409  mg/dL   High  >811     mg/dL   Very High 06/02/7828 5621     Past Medical History  Diagnosis Date  . CAD (coronary artery disease)   . Myocardial infarction   . HTN (hypertension)   . Hyperlipidemia   . Obesity   . Diabetes mellitus   . Fatigue   . OSA (obstructive sleep apnea)   . Persistent  disorder of initiating or maintaining sleep   . Erectile dysfunction   . Nephrolithiasis   . Diverticulosis   . Hemorrhoids   . Personal history of colonic polyps 02/05/2003  . Kidney stones     Past Surgical History  Procedure Laterality Date  . Coronary stent placement      stenting of the right coronary artery with followup rotational  atherectomy.   . Cholecystectomy    . Coronary angioplasty    . Orthopedic surgery    . Rotator cuff surg      Bil  . Inguinal hernia repair      right  . Foot surgery      right  . Finger surgery      right    Current Outpatient Prescriptions  Medication Sig Dispense Refill  .  aspirin 81 MG tablet Take 81 mg by mouth daily.      Marland Kitchen atorvastatin (LIPITOR) 40 MG tablet Take 40 mg by mouth daily.      . chlorpheniramine-HYDROcodone (TUSSIONEX) 10-8 MG/5ML LQCR       . diclofenac (VOLTAREN) 75 MG EC tablet Take 1 tablet (75 mg total) by mouth 2 (two) times daily.  60 tablet  2  . fenofibrate 160 MG tablet Take 160 mg by mouth daily.      . fluticasone (FLONASE) 50 MCG/ACT nasal spray Place 2 sprays into both nostrils daily.  48 g  1  . insulin aspart (NOVOLOG) 100 UNIT/ML injection Inject 100 Units into the skin. Insulin pump gives 1 unit for every 15 carbs      . lisinopril (PRINIVIL,ZESTRIL) 5 MG tablet Take 5 mg by mouth daily.        Marland Kitchen LORazepam (ATIVAN) 1 MG tablet TAKE 1 TABLET EVERY DAY AS NEEDED FOR ANXIETY  90 tablet  1  . metFORMIN (GLUCOPHAGE) 1000 MG tablet Take 1,000 mg by mouth 2 (two) times daily with a meal.      . Multiple Vitamins-Minerals (CENTRUM SILVER PO) 1 tab po qd       . sertraline (ZOLOFT) 100 MG tablet Take 100 mg by mouth daily.       . STUDY MEDICATION Take 1 tablet by mouth daily. Accelerate Study Drug (to raise HDL) Subject ID# [id number] [facility] Dr Olga Millers      . vitamin B-12 (CYANOCOBALAMIN) 1000 MCG tablet Take 1,000 mcg by mouth daily.        Marland Kitchen zolpidem (AMBIEN) 10 MG tablet Take 10 mg by  mouth at bedtime as needed. For insomnia       No current facility-administered medications for this visit.    Allergies  Allergen Reactions  . Penicillins     REACTION: unspecified    History   Social History  . Marital Status: Married    Spouse Name: N/A    Number of Children: N/A  . Years of Education: N/A   Occupational History  . Not on file.   Social History Main Topics  . Smoking status: Former Smoker -- 1.50 packs/day for 30 years    Types: Cigarettes    Quit date: 09/19/1980  . Smokeless tobacco: Never Used  . Alcohol Use: 0.0 oz/week     Comment: social  . Drug Use: No  . Sexual Activity: Not on file   Other Topics Concern  . Not on file   Social History Narrative  . No narrative on file    Family History  Problem Relation Age of Onset  . Liver cancer Father   . Lung cancer Father   . Cancer Father   . Heart disease Father   . Heart disease Sister   . Lung cancer Mother   . COPD Mother   . Cancer Mother   . Obesity Mother     Review of Systems:  As stated in the HPI and otherwise negative.   BP 146/74  Pulse 77  Ht 6' (1.829 m)  Wt 229 lb (103.874 kg)  BMI 31.05 kg/m2  Physical Examination: General: Well developed, well nourished, NAD HEENT: OP clear, mucus membranes moist SKIN: warm, dry. No rashes. Neuro: No focal deficits Musculoskeletal: Muscle strength 5/5 all ext Psychiatric: Mood and affect normal Neck: No JVD, no carotid bruits, no thyromegaly, no lymphadenopathy. Lungs:Clear bilaterally, no wheezes, rhonci, crackles Cardiovascular: Regular rate and rhythm.  No murmurs, gallops or rubs. Abdomen:Soft. Bowel sounds present. Non-tender.  Extremities: No lower extremity edema. Pulses are 2 + in the bilateral DP/PT.  Cardiac cath 02/06/11: 1. Ventriculography done in the RAO projection reveals preserved  global systolic function. No definite segmental wall motion  abnormalities are identified. There may be trace mitral    regurgitation, but none of hemodynamic significance.  2. The left main is slightly tapered, but without significant focal  narrowing.  3. The LAD is fairly heavily calcified. There is an eccentric plaque  of approximately 75% noted in the midvessel. This is just after  the septal and overlying the takeoff of the small first diagonal.  The small first diagonal has about 80% narrowing, unchanged from  the previous study. The mid and distal LAD has about 50%  narrowing, is calcified, but is largely unchanged from prior  studies and the vessel wraps the apex.  4. The circumflex is a large-caliber vessel. There is a ramus  intermedius that has 70-75% proximal segmental plaque. The vessel  beyond this is widely patent. When compared carefully to the  previous study, there does not appear to be a substantial change.  The AV circumflex is a large-caliber vessel without significant  focal narrowing. It is also calcified.  5. The right coronary artery has been previously stented. There is a  long stent in the mid vessel extending down to the distal vessel  overlapping the PDA takeoff, and extending into the posterolateral  segment across the first PLL-1. The stent has been previously  opened, then subsequently rotobladed. The vessel remains widely  patent with about 40% diffuse in-stent re-narrowing, but without  significant focal narrowing.  CONCLUSION:  1. Well-preserved left ventricular systolic function.  2. Continued 70-75% eccentric stenosis of the left anterior descending  artery, largely unchanged from 2003.  3. 70-75% proximal ramus intermedius stenosis, again appears unchanged  from 2003.  4. Prior extensive stenting of the right coronary artery with  subsequent treatment with rotational atherectomy with continued  patency.  EKG: NSR, rate 77 bpm. T wave inversion inferior leads (new)  Assessment and Plan:   1. CAD: Known moderate CAD. No recent chest pains. EKG with new T wave  inversion inferior leads. Continue ASA, statin, Ace-inh.  Will arrange exercise stress myoview to exclude ischemia. Based on results of stress test will discuss need for repeat cath.     2. HYPERLIPIDEMIA: Currently in HDL raising trial with CTEP inhibitor. He is on a statin and fenofibrate.

## 2013-09-04 NOTE — Patient Instructions (Signed)
Your physician wants you to follow-up in:  6 months.  You will receive a reminder letter in the mail two months in advance. If you don't receive a letter, please call our office to schedule the follow-up appointment.  Your physician has requested that you have an exercise stress myoview. For further information please visit [email or web address]. Please follow instruction sheet, as given.  

## 2013-09-06 ENCOUNTER — Encounter: Payer: Self-pay | Admitting: Internal Medicine

## 2013-09-06 ENCOUNTER — Ambulatory Visit (INDEPENDENT_AMBULATORY_CARE_PROVIDER_SITE_OTHER): Payer: Medicare Other | Admitting: Internal Medicine

## 2013-09-06 VITALS — BP 132/68 | HR 79 | Temp 98.1°F | Resp 12 | Wt 231.0 lb

## 2013-09-06 DIAGNOSIS — E119 Type 2 diabetes mellitus without complications: Secondary | ICD-10-CM

## 2013-09-06 LAB — HEMOGLOBIN A1C: Hgb A1c MFr Bld: 6.5 % (ref 4.6–6.5)

## 2013-09-06 NOTE — Patient Instructions (Signed)
Please increase the basal rates as follows (bold): 12 am: 1 u/h 6 am: 1.1 u/h >> 1.3 10 am: 1.1 u/h Please stop at the lab. Please return in 1 month with your sugar log.

## 2013-09-06 NOTE — Progress Notes (Signed)
Patient ID: Pierson Vantol., male   DOB: 1946/08/29, 67 y.o.   MRN: [id number]  HPI: Jarryd Gratz. is a 67 y.o.-year-old male, returning for f/u for DM2, dx 2003, insulin-dependent since 2011, uncontrolled, with complications (coronary artery disease, peripheral neuropathy). He was told he might have developed it from [place] (agent [place]). He gets his meds from the [place] (needs his Rx printed). Last visit 1 mo ago.  Last hemoglobin A1c was:  - at the [place]: 04/12/2013: HbA1C was 7.1%.  Lab Results  Component Value Date   HGBA1C 10.0* 01/09/2013  Prev. 11%.   He did great on a VGo pump but [facility] could not cover this, but they approved a regular insulin pump for him. He started a T-slim pump on 08/28/2013. No CGM. DM educator: Karlene Einstein.  Current regimen:  - Metformin 1000 mg po bid Pump Settings: - basal rates: 12 am: 1 u/h   6 am: 1.1 u/h Basal 16.45 tdd - ICR: 3? Bolus 34.6  tdd - ISF: ? - target: ? - IOB: ? - changes infusion site q 3 days  Pt was on a regimen of: - Metformin 1000 mg po bid - Lantus 25 (<< 20) units in am and 35 (<<30) in hs - vials - Novolog 04-26-09 - pens (can end up with 16 or 18 for dinner) + sliding scale of Novolog: - 150-175: + 2 units - 176-200: + 4 units - 201-225: + 6 units - 226-250: + 8 units - >250: + 10 units  Pt checks his sugars 4 a day and brings a log that we reviewed together): - am: 96-230 >> 120-172 >> 156-274 >> 133-205 >> 117-162 - after b'fast: 120-152 - before lunch 88 (lowest, x1) - 190s >> 109-174 >> 119-251, mostly in the low 100s >> 102-204 >> 93-170 - after lunch: 140-220 >>not checking >> 156-188 >> 111-135 - before dinner: 97-196 >> 147-275 (but rarely >200) >> 111-227 >> 117-304 >> 83-205 (most 140-170) - After dinner 145-268 >> not checking >> not checking >> 122-150 - Bedtime 117-233 >> 135-231 (but rarely >200) >> 143-208 >> 92-263 >> 73-152 No lows. Lowest sugar was 73 in last month. Highest: 206  Pt does not  have chronic kidney disease, last BUN/creatinine was:  Lab Results  Component Value Date   BUN 16 01/09/2013   CREATININE 0.9 01/09/2013   Last set of lipids: Lab Results  Component Value Date   CHOL 194 01/09/2013   HDL 25.50* 01/09/2013   LDLCALC 25       02/14/2011   LDLDIRECT 99.9 01/09/2013   TRIG 554.0 Triglyceride is over 400; calculations on Lipids are invalid.* 01/09/2013   CHOLHDL 8 01/09/2013   Pt's last eye exam was in 02/2013 (no DR, reportedly) - Dr Emily Filbert. No DR.  Has numbness and tingling in his legs - saw [facility].  I reviewed pt's medications, allergies, PMH, social hx, family hx and no changes required, except as mentioned above.  Pt also has a history of HL, HTN, anxiety (on Ativan), kidney stones history of hydronephrosis, OSA-on CPAP, history of right distal radius fracture, colon polyps.  ROS: Constitutional: no weight loss, no fatigue, no subjective hyperthermia/hypothermia Eyes: no blurry vision, no xerophthalmia ENT: no sore throat, no nodules palpated in throat, no dysphagia/odynophagia, no hoarseness Cardiovascular: no CP/SOB/palpitations/leg swelling Respiratory: no cough/SOB Gastrointestinal: no N/V/D/C Musculoskeletal: no muscle aches/no joint aches  Skin: no rashes, + easy bruising  PE: BP 132/68  Pulse 79  Temp(Src) 98.1 F (36.7 C) (Oral)  Resp 12  Wt 231 lb (104.781 kg)  SpO2 97% Wt Readings from Last 3 Encounters:  09/06/13 231 lb (104.781 kg)  09/04/13 229 lb (103.874 kg)  07/31/13 230 lb 6.4 oz (104.509 kg)   Constitutional: overweight, in NAD Eyes: PERRLA, EOMI, no exophthalmos ENT: moist mucous membranes, no thyromegaly, no cervical lymphadenopathy Cardiovascular: RRR, No MRG Respiratory: CTA B Gastrointestinal: abdomen soft, NT, ND, BS+ Musculoskeletal: no deformities, strength intact in all 4 Skin: moist, warm, no rashes Neurological: no tremor with outstretched hands, DTR normal in all 4  ASSESSMENT: 1. DM2,  insulin-dependent, uncontrolled, with complications - coronary artery disease, history of MI - Peripheral neuropathy  PLAN:  1. Patient with long-standing diabetes, with a much improved hemoglobin A1c this summer (per [place], 7.1%).  - he started on an insulin pump (T slim) be more than 2 weeks ago, with good control, except for higher sugars in the morning. We had a hard time reviewing his pump and I could not get all the settings out of it. I am still not sure what is his target blood sugar, insulin sensitivity factor, insulin carb ratio, and active insulin time. He does not know either. - However, I do not see any problem with the above, and the only thing that I would like to change his his morning basal rate: Patient Instructions  Please increase the basal rates as follows (bold): 12 am: 1 u/h 6 am: 1.1 u/h >> 1.3 10 am: 1.1 u/h Please stop at the lab. Please return in 1 month with your sugar log.  - he will talk to his diabetes educator tomorrow and will find out how to obtain the above settings from the pump. I will see him back in a month to see if he needs any other adjustments, but overall, I am pleased with his control and so is he. - we will check a hemoglobin A1c today  Office Visit on 09/06/2013  Component Date Value Range Status  . Hemoglobin A1C 09/06/2013 6.5  4.6 - 6.5 % Final   Glycemic Control Guidelines for People with Diabetes:Non Diabetic:  <6%Goal of Therapy: <7%Additional Action Suggested:  >8%    EXCELLENT! Will let him know.

## 2013-09-16 DIAGNOSIS — L570 Actinic keratosis: Secondary | ICD-10-CM | POA: Diagnosis not present

## 2013-09-16 DIAGNOSIS — D1801 Hemangioma of skin and subcutaneous tissue: Secondary | ICD-10-CM | POA: Diagnosis not present

## 2013-09-16 DIAGNOSIS — L821 Other seborrheic keratosis: Secondary | ICD-10-CM | POA: Diagnosis not present

## 2013-09-25 ENCOUNTER — Ambulatory Visit (HOSPITAL_COMMUNITY): Payer: Medicare Other | Attending: Cardiovascular Disease | Admitting: Radiology

## 2013-09-25 ENCOUNTER — Encounter: Payer: Self-pay | Admitting: Cardiovascular Disease

## 2013-09-25 VITALS — BP 164/72 | HR 70 | Ht 72.0 in | Wt 227.0 lb

## 2013-09-25 DIAGNOSIS — R9439 Abnormal result of other cardiovascular function study: Secondary | ICD-10-CM

## 2013-09-25 DIAGNOSIS — Z9861 Coronary angioplasty status: Secondary | ICD-10-CM | POA: Insufficient documentation

## 2013-09-25 DIAGNOSIS — E119 Type 2 diabetes mellitus without complications: Secondary | ICD-10-CM | POA: Diagnosis not present

## 2013-09-25 DIAGNOSIS — Z87891 Personal history of nicotine dependence: Secondary | ICD-10-CM | POA: Insufficient documentation

## 2013-09-25 DIAGNOSIS — I252 Old myocardial infarction: Secondary | ICD-10-CM | POA: Diagnosis not present

## 2013-09-25 DIAGNOSIS — I10 Essential (primary) hypertension: Secondary | ICD-10-CM | POA: Insufficient documentation

## 2013-09-25 DIAGNOSIS — I2510 Atherosclerotic heart disease of native coronary artery without angina pectoris: Secondary | ICD-10-CM | POA: Insufficient documentation

## 2013-09-25 MED ORDER — TECHNETIUM TC 99M SESTAMIBI GENERIC - CARDIOLITE
10.8000 | Freq: Once | INTRAVENOUS | Status: AC | PRN
  Administered 2013-09-25: 11 via INTRAVENOUS

## 2013-09-25 MED ORDER — TECHNETIUM TC 99M SESTAMIBI GENERIC - CARDIOLITE
33.0000 | Freq: Once | INTRAVENOUS | Status: AC | PRN
  Administered 2013-09-25: 33 via INTRAVENOUS

## 2013-09-25 NOTE — Progress Notes (Signed)
[facility] [address] [place], [place] [address] [phone]    Cardiology Nuclear Med Study  Brent [name]. is a 68 y.o. male     MRN : [id number]     DOB: 1946/04/09  Procedure Date: 09/25/2013  Nuclear Med Background Indication for Stress Test:  Evaluation for Ischemia History:  CAD, MI, Cath 2012, Stent RCA 2000, PTCA RCA 1990, MPI 2012 (normal) EF 63% Cardiac Risk Factors: Family History - CAD, History of Smoking, Hypertension, IDDM Type 2 and Lipids  Symptoms:  Non indicated   Nuclear Pre-Procedure Caffeine/Decaff Intake:  None NPO After: 10:00pm   Lungs:  clear O2 Sat: 97% on room air. IV 0.9% NS with Angio Cath:  22g  IV Site: R Hand  IV Started by:  Crissie Figures, RN  Chest Size (in):  46 Cup Size: n/a  Height: 6' (1.829 m)  Weight:  227 lb (102.967 kg)  BMI:  Body mass index is 30.78 kg/(m^2). Tech Comments:  N/A    Nuclear Med Study 1 or 2 day study: 1 day  Stress Test Type:  Stress  Reading MD: N/A  Order Authorizing Provider:  Lauree Chandler, MD  Resting Radionuclide: Technetium 77m Sestamibi  Resting Radionuclide Dose: 11.0 mCi   Stress Radionuclide:  Technetium 25m Sestamibi  Stress Radionuclide Dose: 33.0 mCi           Stress Protocol Rest HR: 70 Stress HR: 130  Rest BP: 164/72 Stress BP: 207/81  Exercise Time (min): 8:30 METS: 10.1           Dose of Adenosine (mg):  n/a Dose of Lexiscan: n/a mg  Dose of Atropine (mg): n/a Dose of Dobutamine: n/a mcg/kg/min (at max HR)  Stress Test Technologist: Glade Lloyd, BS-ES  Nuclear Technologist:  Charlton Amor, CNMT     Rest Procedure:  Myocardial perfusion imaging was performed at rest 45 minutes following the intravenous administration of Technetium 72m Sestamibi. Rest ECG: NSR nonspecific ST/T wave changes  Stress Procedure:  The patient exercised on the treadmill utilizing the Bruce Protocol for 8:30 minutes. The patient stopped due to SOB and denied  any chest pain.  Technetium 67m Sestamibi was injected at peak exercise and myocardial perfusion imaging was performed after a brief delay. Stress ECG: Significant ST abnormalities consistent with ischemia.  QPS Raw Data Images:  Patient motion noted. Stress Images:  Normal homogeneous uptake in all areas of the myocardium. Rest Images:  Normal homogeneous uptake in all areas of the myocardium. Subtraction (SDS):  Normal Transient Ischemic Dilatation (Normal <1.22):  0.97 Lung/Heart Ratio (Normal <0.45):  0.24  Quantitative Gated Spect Images QGS EDV:  115 ml QGS ESV:  55 ml  Impression Exercise Capacity:  Fair exercise capacity. BP Response:  Hypertensive blood pressure response. Clinical Symptoms:  There is dyspnea. ECG Impression:  Significant ST abnormalities consistent with ischemia. Comparison with Prior Nuclear Study: No images to compare  Overall Impression:  Low risk stress nuclear study No perfusion defects.  Hypertensive response to exercise.  Baseline ECG with nonspecific ST/T wave changes but 71mm ST segment depression in inferior and lateral leads with stress.  LV Ejection Fraction: 52%.  LV Wall Motion:  NL LV Function; NL Wall Motion  Jenkins Rouge

## 2013-10-08 ENCOUNTER — Ambulatory Visit (INDEPENDENT_AMBULATORY_CARE_PROVIDER_SITE_OTHER): Payer: Medicare Other | Admitting: Internal Medicine

## 2013-10-08 ENCOUNTER — Encounter: Payer: Self-pay | Admitting: Internal Medicine

## 2013-10-08 VITALS — BP 122/62 | HR 84 | Temp 99.0°F | Resp 12 | Wt 230.5 lb

## 2013-10-08 DIAGNOSIS — E119 Type 2 diabetes mellitus without complications: Secondary | ICD-10-CM

## 2013-10-08 NOTE — Progress Notes (Signed)
Patient ID: Brent Kallam., male   DOB: [birth year], 68 y.o.   MRN: [id number]  HPI: Brent Mackowski. is a 68 y.o.-year-old male, returning for f/u for DM2, dx 2003, insulin-dependent since 2011, uncontrolled, with complications (coronary artery disease, peripheral neuropathy). He was told he might have developed it from [place] (agent [place]). He gets his meds from the [place] (needs his Rx printed). Last visit 1 mo ago.  Last hemoglobin A1c was:  Lab Results  Component Value Date   HGBA1C 6.5 09/06/2013  - at the [place]: 04/12/2013: HbA1C was 7.1%.  - Previously, HbA1c was 11%.   He did great on a VGo pump but [facility] could not cover this, but they approved a regular insulin pump for him. He started a T-slim pump on 08/28/2013. No CGM. DM educator: Nelia Shi.  Current regimen:  - Metformin 1000 mg po bid Pump Settings: - basal rates - 28 units tdd: changed by Wells Guiles 1 mo ago 12 am: 1 u/h >> 1.15   6 am: 1.1 u/h >> 1.3 >> 1.175   10 am: 1.1 u/h - ICR: 3 - ISF: 20 - target: 120 - IOB: 4h  Bolus tdd: ~50 units - changes infusion site q 3 days  Pt checks his sugars 4 a day and brings a log that we reviewed together): - am: 96-230 >> 120-172 >> 156-274 >> 133-205 >> 117-162 >> 108-167 - after b'fast: 120-152 >> n/c - before lunch 88 (lowest, x1) - 190s >> 109-174 >> 119-251, mostly in the low 100s >> 102-204 >> 93-170 >> 92-130s - after lunch: 140-220 >>not checking >> 156-188 >> 111-135 >> n/c  - before dinner: 97-196 >> 147-275 (but rarely >200) >> 111-227 >> 117-304 >> 83-205 (most 140-170) >> 76-151 - After dinner 145-268 >> not checking >> not checking >> 122-150 >> 99-188 - Bedtime 117-233 >> 135-231 (but rarely >200) >> 143-208 >> 92-263 >> 73-152 No lows. Lowest sugar was 76 in last month. Highest: 205 (did not bolus).  Pt does not have chronic kidney disease, last BUN/creatinine was:  Lab Results  Component Value Date   BUN 16 01/09/2013   CREATININE 0.9 01/09/2013    Last set of lipids: Lab Results  Component Value Date   CHOL 194 01/09/2013   HDL 25.50* 01/09/2013   LDLCALC 25       02/14/2011   LDLDIRECT 99.9 01/09/2013   TRIG 554.0 Triglyceride is over 400; calculations on Lipids are invalid.* 01/09/2013   CHOLHDL 8 01/09/2013   Pt's last eye exam was in 02/2013 (no DR, reportedly) - Dr Delman Cheadle. No DR.  Has numbness and tingling in his legs - saw [facility].  I reviewed pt's medications, allergies, PMH, social hx, family hx and no changes required, except as mentioned above.  Pt also has a history of HL, HTN, anxiety, kidney stones history of hydronephrosis, OSA-on CPAP, history of right distal radius fracture, colon polyps.  ROS: Constitutional: no weight loss, + fatigue, no subjective hyperthermia/hypothermia, + poor sleep Eyes: no blurry vision, no xerophthalmia ENT: no sore throat, no nodules palpated in throat, no dysphagia/odynophagia, no hoarseness Cardiovascular: no CP/SOB/palpitations/leg swelling Respiratory: no cough/SOB Gastrointestinal: no N/V/D/C Musculoskeletal: no muscle aches/no joint aches  Skin: no rashes, + easy bruising  PE: BP 122/62  Pulse 84  Temp(Src) 99 F (37.2 C) (Oral)  Resp 12  Wt 230 lb 8 oz (104.554 kg)  SpO2 96% Wt Readings from Last 3 Encounters:  10/08/13 230 lb 8  oz (104.554 kg)  09/25/13 227 lb (102.967 kg)  09/06/13 231 lb (104.781 kg)   Constitutional: overweight, in NAD Eyes: PERRLA, EOMI, no exophthalmos ENT: moist mucous membranes, no thyromegaly, no cervical lymphadenopathy Cardiovascular: RRR, No MRG Respiratory: CTA B Gastrointestinal: abdomen soft, NT, ND, BS+ Musculoskeletal: no deformities, strength intact in all 4 Skin: moist, warm, no rashes Neurological: no tremor with outstretched hands, DTR normal in all 4  ASSESSMENT: 1. DM2, insulin-dependent, uncontrolled, with complications - coronary artery disease, history of MI - Peripheral neuropathy  PLAN:  1. Patient with  long-standing diabetes, with a much improved hemoglobin A1c after starting ona n insulin pump >> HbA1c 6.5% at last visit. - he is on an insulin pump (T slim) and sugars are well controlled on the current settings.  - We will continue the current settings: Patient Instructions  Stay on the same regimen: - Metformin 1000 mg po bid  Pump Settings:  - basal rates   12 am: 1 u/h >> 1.15   6 am: 1.1 u/h >> 1.3 >> 1.175   10 am: 1.1 u/h  - ICR: 3  - ISF: 20  - target: 120  - IOB: 4h  - I will see him back in 3 months >> HbA1c at next visit.

## 2013-10-08 NOTE — Patient Instructions (Signed)
Stay on the same regimen: - Metformin 1000 mg po bid  Pump Settings:  - basal rates   12 am: 1 u/h >> 1.15   6 am: 1.1 u/h >> 1.3 >> 1.175   10 am: 1.1 u/h  - ICR: 3  - ISF: 20  - target: 120  - IOB: 4h

## 2013-10-21 ENCOUNTER — Other Ambulatory Visit: Payer: Self-pay | Admitting: Urology

## 2013-10-21 DIAGNOSIS — N200 Calculus of kidney: Secondary | ICD-10-CM

## 2013-10-23 ENCOUNTER — Ambulatory Visit
Admission: RE | Admit: 2013-10-23 | Discharge: 2013-10-23 | Disposition: A | Discharge status: Home / Self Care | Payer: Medicare Other | Source: Ambulatory Visit | Attending: Urology | Admitting: Urology

## 2013-10-23 DIAGNOSIS — N281 Cyst of kidney, acquired: Secondary | ICD-10-CM | POA: Diagnosis not present

## 2013-10-23 DIAGNOSIS — N200 Calculus of kidney: Secondary | ICD-10-CM

## 2013-11-26 ENCOUNTER — Telehealth: Payer: Self-pay | Admitting: *Deleted

## 2013-11-26 NOTE — Telephone Encounter (Signed)
Patient is followed in the Accelerate Study.CPK on 3/3 2015 was 425 U/L. Patient had repeat CPK on 11/26/13 and CPK was 226 U/L.Patient was notified by phone of results.

## 2013-11-27 NOTE — Telephone Encounter (Signed)
Thanks

## 2013-12-02 ENCOUNTER — Encounter: Payer: Self-pay | Admitting: Cardiology

## 2013-12-16 ENCOUNTER — Other Ambulatory Visit: Payer: Self-pay | Admitting: Internal Medicine

## 2013-12-26 ENCOUNTER — Encounter: Payer: Self-pay | Admitting: Cardiology

## 2014-01-01 ENCOUNTER — Telehealth: Payer: Self-pay | Admitting: *Deleted

## 2014-01-01 NOTE — Telephone Encounter (Signed)
Thanks

## 2014-01-01 NOTE — Telephone Encounter (Signed)
I received a call from patient today stating he has been weak and lethargic for 3 weeks. Patient decided to take himself off the Accelerate Study drug on November 29, 2013. He felt much better off drug. He resumed study drug on March 21,2015. Patient began to feel weak and lethargic again. Patient stopped study drug on 12/25/13.Patient stated he felt better. I told patient to stay off study drug and we will bring him back in to research office on 01/01/14. I will draw labs and have Dr. Lia Foyer see patient. Patient verbalized understanding.

## 2014-01-06 ENCOUNTER — Encounter: Payer: Self-pay | Admitting: Internal Medicine

## 2014-01-06 ENCOUNTER — Ambulatory Visit (INDEPENDENT_AMBULATORY_CARE_PROVIDER_SITE_OTHER): Payer: Medicare Other | Admitting: Internal Medicine

## 2014-01-06 VITALS — BP 118/74 | HR 87 | Temp 98.3°F | Resp 12 | Wt 228.0 lb

## 2014-01-06 DIAGNOSIS — I2510 Atherosclerotic heart disease of native coronary artery without angina pectoris: Secondary | ICD-10-CM

## 2014-01-06 DIAGNOSIS — E119 Type 2 diabetes mellitus without complications: Secondary | ICD-10-CM | POA: Diagnosis not present

## 2014-01-06 LAB — HEMOGLOBIN A1C: Hgb A1c MFr Bld: 6.3 % (ref 4.6–6.5)

## 2014-01-06 NOTE — Progress Notes (Signed)
Patient ID: Brent Pfarr., male   DOB: 1946-04-10, 68 y.o.   MRN: [id number]  HPI: Brent Dishman. is a 68 y.o.-year-old male, returning for f/u for DM2, dx 2003, insulin-dependent since 2011, uncontrolled, with complications (coronary artery disease, peripheral neuropathy). He was told he might have developed it from [place] (agent [place]). He gets his meds from the [place] (needs his Rx printed). Last visit 1 mo ago.  Last hemoglobin A1c was:  Lab Results  Component Value Date   HGBA1C 6.5 09/06/2013  - at the [place]: 04/12/2013: HbA1C was 7.1%.  - Previously, HbA1c was 11%.   He started a T-slim pump on 08/28/2013. No CGM. DM educator: Nelia Shi.  Current regimen:  - Metformin 1000 mg po bid Pump Settings: - basal rates - 29 units tdd:  12 am: 1 u/h >> 1.15   6 am: 1.1 u/h >> 1.3 >> 1.175   10 am: 1.1 u/h >> 1.175 - ICR: 3 - ISF: 20 - target: 120 - IOB: 4h  Bolus tdd: ~50 units - changes infusion site q 3 days, no pb with the infusion site  Pt checks his sugars 4 a day and brings a log that we reviewed together): - am: 96-230 >> 120-172 >> 156-274 >> 133-205 >> 117-162 >> 108-167 >> 116-209 - after b'fast: 120-152 >> n/c >> 128 - before lunch 88 (lowest, x1) - 190s >> 109-174 >> 119-251, mostly in the low 100s >> 102-204 >> 93-170 >> 92-130s >> 89-156 - after lunch: 140-220 >>not checking >> 156-188 >> 111-135 >> n/c >> n/c - before dinner: 97-196 >> 147-275 (but rarely >200) >> 111-227 >> 117-304 >> 83-205 (most 140-170) >> 76-151 >> 106-178 - After dinner 145-268 >> not checking >> not checking >> 122-150 >> 99-188 - Bedtime 117-233 >> 135-231 (but rarely >200) >> 143-208 >> 92-263 >> 73-152 >> 91-215 No lows. Highest: 215.  Pt does not have chronic kidney disease, last BUN/creatinine was:  Lab Results  Component Value Date   BUN 16 01/09/2013   CREATININE 0.9 01/09/2013   Last set of lipids: Lab Results  Component Value Date   CHOL 194 01/09/2013   HDL 25.50*  01/09/2013   LDLCALC 25       02/14/2011   LDLDIRECT 99.9 01/09/2013   TRIG 554.0 Triglyceride is over 400; calculations on Lipids are invalid.* 01/09/2013   CHOLHDL 8 01/09/2013   Pt's last eye exam was in 02/2013 (no DR, reportedly) - Dr Delman Cheadle. No DR.  Has numbness and tingling in his legs - saw [facility] [facility].  Pt also has a history of HL, HTN, anxiety, kidney stones history of hydronephrosis, OSA-on CPAP, history of right distal radius fracture, colon polyps.  ROS: Constitutional: no weight loss, + fatigue, no subjective hyperthermia/hypothermia, no nocturia Eyes: no blurry vision, no xerophthalmia ENT: no sore throat, no nodules palpated in throat, no dysphagia/odynophagia, no hoarseness Cardiovascular: no CP/SOB/palpitations/leg swelling Respiratory: + cough/no SOB Gastrointestinal: no N/V/D/C Musculoskeletal: no muscle aches/no joint aches  Skin: no rashes  I reviewed pt's medications, allergies, PMH, social hx, family hx and no changes required, except as mentioned above.  PE: BP 118/74  Pulse 87  Temp(Src) 98.3 F (36.8 C) (Oral)  Resp 12  Wt 228 lb (103.42 kg)  SpO2 96% Wt Readings from Last 3 Encounters:  01/06/14 228 lb (103.42 kg)  10/08/13 230 lb 8 oz (104.554 kg)  09/25/13 227 lb (102.967 kg)   Constitutional: overweight, in NAD Eyes: PERRLA,  EOMI, no exophthalmos ENT: moist mucous membranes, no thyromegaly, no cervical lymphadenopathy Cardiovascular: RRR, No MRG Respiratory: CTA B Gastrointestinal: abdomen soft, NT, ND, BS+ Musculoskeletal: no deformities, strength intact in all 4 Skin: moist, warm, no rashes Neurological: no tremor with outstretched hands, DTR normal in all 4  ASSESSMENT: 1. DM2, insulin-dependent, uncontrolled, with complications - coronary artery disease, history of MI - Peripheral neuropathy * on a T-slim pump  PLAN:  1. Patient with long-standing diabetes, with a much improved hemoglobin A1c after starting on insulin pump >> HbA1c  6.5% at last visit. Sugars controlled, with occasional high's, higher than goal in am and also occasionally after dinner. Still has an imbalance BD:ZHGDJ and bolus TDD, with predominance of bolusing. - We will continue the current settings   Patient Instructions  - Continue Metformin 1000 mg po bid Change the Pump Settings: - basal rates  12 am: 1.15 u/h   6 am: 1.175 u/h >> 1.225 - ICR: 3 - ISF: 20 - target: 120 - IOB: 4h   Please bolus 15 min before you eat.   Please stop at the lab.  - check A1c today - I will see him back in 3 months  Office Visit on 01/06/2014  Component Date Value Ref Range Status  . Hemoglobin A1C 01/06/2014 6.3  4.6 - 6.5 % Final   Glycemic Control Guidelines for People with Diabetes:Non Diabetic:  <6%Goal of Therapy: <7%Additional Action Suggested:  >8%

## 2014-01-06 NOTE — Patient Instructions (Signed)
-   Continue Metformin 1000 mg po bid Change the Pump Settings: - basal rates  12 am: 1.15 u/h   6 am: 1.175 u/h >> 1.225 - ICR: 3 - ISF: 20 - target: 120 - IOB: 4h   Please bolus 15 min before you eat.   Please stop at the lab.

## 2014-01-09 ENCOUNTER — Encounter: Payer: Self-pay | Admitting: Cardiology

## 2014-01-13 LAB — HEMOGLOBIN A1C: Hgb A1c MFr Bld: 6.2 % — AB (ref 4.0–6.0)

## 2014-01-15 DIAGNOSIS — H2510 Age-related nuclear cataract, unspecified eye: Secondary | ICD-10-CM | POA: Diagnosis not present

## 2014-01-15 DIAGNOSIS — E119 Type 2 diabetes mellitus without complications: Secondary | ICD-10-CM | POA: Diagnosis not present

## 2014-01-15 LAB — HM DIABETES EYE EXAM

## 2014-01-21 ENCOUNTER — Ambulatory Visit (INDEPENDENT_AMBULATORY_CARE_PROVIDER_SITE_OTHER): Payer: Medicare Other | Admitting: Physician Assistant

## 2014-01-21 ENCOUNTER — Encounter: Payer: Self-pay | Admitting: Physician Assistant

## 2014-01-21 VITALS — BP 120/68 | HR 87 | Temp 97.9°F | Resp 16 | Wt 230.0 lb

## 2014-01-21 DIAGNOSIS — J3489 Other specified disorders of nose and nasal sinuses: Secondary | ICD-10-CM

## 2014-01-21 DIAGNOSIS — Z23 Encounter for immunization: Secondary | ICD-10-CM

## 2014-01-21 DIAGNOSIS — I2510 Atherosclerotic heart disease of native coronary artery without angina pectoris: Secondary | ICD-10-CM

## 2014-01-21 NOTE — Patient Instructions (Signed)
Call ENT office of [facility] phone number: [phone] and make appointment to be seen.  Try to leave the area alone as much as possible.  Follow up to clinic as needed.

## 2014-01-21 NOTE — Progress Notes (Signed)
Subjective:    Patient ID: Brent [name]., male    DOB: Nov 03, 1945, 68 y.o.   MRN: [id number]  HPI Patient is a 68 year old Caucasian male nonsmoker presenting to the clinic today for a sore in his nose. Patient states that he has been dealing with a right nostril sore for approximately a month and a half. He states that he started putting Neosporin on it when it first arose, using a Q-tip, which she believes helped it resolve a little as it scabbed over. He states that it never fully healed, and a week and a half ago it started bothering him again. He states that the pain is 3/10 bothersome stinging pain, it's worse whenever it is touched, and it feels "raw". He's tried Neosporin for it again this time, however this is not working. He denies fevers, chills, nausea, vomiting, diarrhea, and shortness of breath.   Review of Systems As per the history of present illness and are otherwise negative.  Past Medical History  Diagnosis Date  . CAD (coronary artery disease)   . Myocardial infarction   . HTN (hypertension)   . Hyperlipidemia   . Obesity   . Diabetes mellitus   . Fatigue   . OSA (obstructive sleep apnea)   . Persistent disorder of initiating or maintaining sleep   . Erectile dysfunction   . Nephrolithiasis   . Diverticulosis   . Hemorrhoids   . Personal history of colonic polyps 02/05/2003  . Kidney stones    Past Surgical History  Procedure Laterality Date  . Coronary stent placement      stenting of the right coronary artery with followup rotational  atherectomy.   . Cholecystectomy    . Coronary angioplasty    . Orthopedic surgery    . Rotator cuff surg      Bil  . Inguinal hernia repair      right  . Foot surgery      right  . Finger surgery      right    reports that he quit smoking about 33 years ago. His smoking use included Cigarettes. He has a 45 pack-year smoking history. He has never used smokeless tobacco. He reports that he drinks alcohol. He  reports that he does not use illicit drugs. family history includes COPD in his mother; Cancer in his father and mother; Heart disease in his father and sister; Liver cancer in his father; Lung cancer in his father and mother; Obesity in his mother. Allergies  Allergen Reactions  . Penicillins     REACTION: unspecified       Objective:   Physical Exam  Nursing note and vitals reviewed. Constitutional: He is oriented to person, place, and time. He appears well-developed and well-nourished. No distress.  HENT:  Head: Normocephalic and atraumatic.  Mouth/Throat: Oropharynx is clear and moist. No oropharyngeal exudate.  Right nostril: There is a small subcentimeter erythematous lesion at the nasal septum near the opening of the nostril. This looks slightly friable and has a small laceration at its posterior. It is mildly tender to palpation.  Left nostril: Appears normal.  Eyes: Conjunctivae and EOM are normal. Pupils are equal, round, and reactive to light.  Neck: Normal range of motion.  Cardiovascular: Normal rate, regular rhythm, normal heart sounds and intact distal pulses.  Exam reveals no gallop and no friction rub.   No murmur heard. Pulmonary/Chest: Effort normal and breath sounds normal. No respiratory distress. He has no wheezes. He has no  rales. He exhibits no tenderness.  Musculoskeletal: Normal range of motion.  Neurological: He is alert and oriented to person, place, and time.  Skin: Skin is warm and dry. No rash noted. He is not diaphoretic. No erythema. No pallor.  Psychiatric: He has a normal mood and affect. His behavior is normal. Judgment and thought content normal.   Filed Vitals:   01/21/14 1423  BP: 120/68  Pulse: 87  Temp: 97.9 F (36.6 C)  Resp: 16   Lab Results  Component Value Date   WBC 10.3 07/16/2012   HGB 14.4 07/16/2012   HCT 41.8 07/16/2012   PLT 249 07/16/2012   GLUCOSE 288* 01/09/2013   CHOL 194 01/09/2013   TRIG 554.0 Triglyceride is over  400; calculations on Lipids are invalid.* 01/09/2013   HDL 25.50* 01/09/2013   LDLDIRECT 99.9 01/09/2013   LDLCALC  Value: 25        Total Cholesterol/HDL:CHD Risk Coronary Heart Disease Risk Table                     Men   Women  1/2 Average Risk   3.4   3.3  Average Risk       5.0   4.4  2 X Average Risk   9.6   7.1  3 X Average Risk  23.4   11.0        Use the calculated Patient Ratio above and the CHD Risk Table to determine the patient's CHD Risk.        ATP III CLASSIFICATION (LDL):  <100     mg/dL   Optimal  100-129  mg/dL   Near or Above                    Optimal  130-159  mg/dL   Borderline  160-189  mg/dL   High  >190     mg/dL   Very High 02/14/2011   ALT 44 01/09/2013   AST 43* 01/09/2013   NA 136 01/09/2013   K 4.1 01/09/2013   CL 102 01/09/2013   CREATININE 0.9 01/09/2013   BUN 16 01/09/2013   CO2 25 01/09/2013   TSH 1.31 04/16/2010   INR 0.93 02/13/2011   HGBA1C 6.3 01/06/2014   MICROALBUR 1.7 01/09/2013      Assessment & Plan:  Makenzie was seen today for sore in nose.  Diagnoses and associated orders for this visit:  Need for prophylactic vaccination with combined diphtheria-tetanus-pertussis (DTP) vaccine - Tdap vaccine greater than or equal to 7yo IM  Nasal lesion - Spoke with Dr. Sherren Mocha regarding pt. We agree that this lesion would be best treated by ENT. Dr. Sherren Mocha recommended [facility] and noted that pt does not require referral to be seen there. Pt will make an appointment to be seen by ENT after visit.   Patient Instructions  Call ENT office of [facility] phone number: [phone] and make appointment to be seen.  Try to leave the area alone as much as possible.  Follow up to clinic as needed.

## 2014-01-21 NOTE — Progress Notes (Signed)
Pre visit review using our clinic review tool, if applicable. No additional management support is needed unless otherwise documented below in the visit note. 

## 2014-01-24 ENCOUNTER — Encounter: Payer: Self-pay | Admitting: Internal Medicine

## 2014-01-30 DIAGNOSIS — J00 Acute nasopharyngitis [common cold]: Secondary | ICD-10-CM | POA: Diagnosis not present

## 2014-02-07 ENCOUNTER — Encounter: Payer: Self-pay | Admitting: Cardiovascular Disease

## 2014-03-06 ENCOUNTER — Ambulatory Visit: Payer: Medicare Other | Admitting: Cardiovascular Disease

## 2014-03-11 ENCOUNTER — Other Ambulatory Visit: Payer: Self-pay | Admitting: Internal Medicine

## 2014-03-18 ENCOUNTER — Other Ambulatory Visit: Payer: Self-pay | Admitting: Internal Medicine

## 2014-03-20 ENCOUNTER — Encounter: Payer: Self-pay | Admitting: Internal Medicine

## 2014-03-20 ENCOUNTER — Ambulatory Visit (INDEPENDENT_AMBULATORY_CARE_PROVIDER_SITE_OTHER): Payer: Medicare Other | Admitting: Internal Medicine

## 2014-03-20 VITALS — BP 160/90 | HR 109 | Temp 98.4°F | Resp 20 | Ht 72.0 in | Wt 237.0 lb

## 2014-03-20 DIAGNOSIS — E785 Hyperlipidemia, unspecified: Secondary | ICD-10-CM | POA: Diagnosis not present

## 2014-03-20 DIAGNOSIS — I10 Essential (primary) hypertension: Secondary | ICD-10-CM

## 2014-03-20 DIAGNOSIS — I2510 Atherosclerotic heart disease of native coronary artery without angina pectoris: Secondary | ICD-10-CM | POA: Diagnosis not present

## 2014-03-20 DIAGNOSIS — E119 Type 2 diabetes mellitus without complications: Secondary | ICD-10-CM

## 2014-03-20 DIAGNOSIS — I252 Old myocardial infarction: Secondary | ICD-10-CM | POA: Diagnosis not present

## 2014-03-20 MED ORDER — LORAZEPAM 1 MG PO TABS: ORAL_TABLET | ORAL | Status: DC

## 2014-03-20 NOTE — Progress Notes (Signed)
Subjective:    Patient ID: Brent [name]., male    DOB: Jun 18, 1946, 68 y.o.   MRN: [id number]  HPI  68 year old patient who is followed closely by cardiology and endocrinology.  He has type 2 diabetes on a insulin pump.  He has CAD, history of hypertension, and dyslipidemia.  Doing quite well. He is also followed [facility]. His wife is followed by me and he is requesting transfer due to PCP on availability.  SH- he and his wife care for a grandchild after his son's death 3 years ago  Past Medical History  Diagnosis Date  . CAD (coronary artery disease)   . Myocardial infarction   . HTN (hypertension)   . Hyperlipidemia   . Obesity   . Diabetes mellitus   . Fatigue   . OSA (obstructive sleep apnea)   . Persistent disorder of initiating or maintaining sleep   . Erectile dysfunction   . Nephrolithiasis   . Diverticulosis   . Hemorrhoids   . Personal history of colonic polyps 02/05/2003  . Kidney stones     History   Social History  . Marital Status: Married    Spouse Name: N/A    Number of Children: N/A  . Years of Education: N/A   Occupational History  . Not on file.   Social History Main Topics  . Smoking status: Former Smoker -- 1.50 packs/day for 30 years    Types: Cigarettes    Quit date: 09/19/1980  . Smokeless tobacco: Never Used  . Alcohol Use: 0.0 oz/week     Comment: social  . Drug Use: No  . Sexual Activity: Not on file   Other Topics Concern  . Not on file   Social History Narrative  . No narrative on file    Past Surgical History  Procedure Laterality Date  . Coronary stent placement      stenting of the right coronary artery with followup rotational  atherectomy.   . Cholecystectomy    . Coronary angioplasty    . Orthopedic surgery    . Rotator cuff surg      Bil  . Inguinal hernia repair      right  . Foot surgery      right  . Finger surgery      right    Family History  Problem Relation Age of Onset  . Liver  cancer Father   . Lung cancer Father   . Cancer Father   . Heart disease Father   . Heart disease Sister   . Lung cancer Mother   . COPD Mother   . Cancer Mother   . Obesity Mother     Allergies  Allergen Reactions  . Penicillins     REACTION: unspecified    Current Outpatient Prescriptions on File Prior to Visit  Medication Sig Dispense Refill  . aspirin 81 MG tablet Take 81 mg by mouth daily.      Marland Kitchen atorvastatin (LIPITOR) 40 MG tablet Take 40 mg by mouth daily.      . diclofenac (VOLTAREN) 75 MG EC tablet Take 1 tablet (75 mg total) by mouth 2 (two) times daily.  60 tablet  2  . fenofibrate 160 MG tablet Take 160 mg by mouth daily.      . fluticasone (FLONASE) 50 MCG/ACT nasal spray Place 2 sprays into both nostrils daily.  48 g  1  . insulin aspart (NOVOLOG) 100 UNIT/ML injection Inject 100 Units into the  skin. Insulin pump gives 1 unit for every 15 carbs      . lisinopril (PRINIVIL,ZESTRIL) 5 MG tablet Take 5 mg by mouth daily.        . metFORMIN (GLUCOPHAGE) 1000 MG tablet Take 1,000 mg by mouth 2 (two) times daily with a meal.      . Multiple Vitamins-Minerals (CENTRUM SILVER PO) 1 tab po qd       . sertraline (ZOLOFT) 100 MG tablet Take 100 mg by mouth daily.       . vitamin B-12 (CYANOCOBALAMIN) 1000 MCG tablet Take 1,000 mcg by mouth daily.        Marland Kitchen zolpidem (AMBIEN) 10 MG tablet Take 10 mg by mouth at bedtime as needed. For insomnia      . chlorpheniramine-HYDROcodone (TUSSIONEX) 10-8 MG/5ML LQCR        No current facility-administered medications on file prior to visit.    BP 160/90  Pulse 109  Temp(Src) 98.4 F (36.9 C) (Oral)  Resp 20  Ht 6' (1.829 m)  Wt 237 lb (107.502 kg)  BMI 32.14 kg/m2  SpO2 97%       Review of Systems  Constitutional: Negative for fever, chills, appetite change and fatigue.  HENT: Negative for congestion, dental problem, ear pain, hearing loss, sore throat, tinnitus, trouble swallowing and voice change.   Eyes: Negative for  pain, discharge and visual disturbance.  Respiratory: Negative for cough, chest tightness, wheezing and stridor.   Cardiovascular: Negative for chest pain, palpitations and leg swelling.  Gastrointestinal: Negative for nausea, vomiting, abdominal pain, diarrhea, constipation, blood in stool and abdominal distention.  Genitourinary: Negative for urgency, hematuria, flank pain, discharge, difficulty urinating and genital sores.  Musculoskeletal: Negative for arthralgias, back pain, gait problem, joint swelling, myalgias and neck stiffness.  Skin: Negative for rash.  Neurological: Negative for dizziness, syncope, speech difficulty, weakness, numbness and headaches.  Hematological: Negative for adenopathy. Does not bruise/bleed easily.  Psychiatric/Behavioral: Negative for behavioral problems and dysphoric mood. The patient is not nervous/anxious.        Objective:   Physical Exam  Constitutional: He is oriented to person, place, and time. He appears well-developed.  HENT:  Head: Normocephalic.  Right Ear: External ear normal.  Left Ear: External ear normal.  Eyes: Conjunctivae and EOM are normal.  Neck: Normal range of motion.  Cardiovascular: Normal rate, normal heart sounds and intact distal pulses.   Pulmonary/Chest: Breath sounds normal.  Abdominal: Bowel sounds are normal.  Musculoskeletal: Normal range of motion. He exhibits no edema and no tenderness.  Neurological: He is alert and oriented to person, place, and time.  Psychiatric: He has a normal mood and affect. His behavior is normal.          Assessment & Plan:   Diabetes mellitus.  Well controlled.  Followup endocrinology Hypertension slightly elevated today after 3 cups of coffee.  We'll continue to observe. Coronary artery disease, stable.  Continue aggressive risk factor modification, and cardiology followup  Recheck here 6 months

## 2014-03-20 NOTE — Progress Notes (Signed)
Pre visit review using our clinic review tool, if applicable. No additional management support is needed unless otherwise documented below in the visit note. 

## 2014-03-20 NOTE — Patient Instructions (Signed)
Limit your sodium (Salt) intake  Return in 6 months for follow-up    It is important that you exercise regularly, at least 20 minutes 3 to 4 times per week.  If you develop chest pain or shortness of breath seek  medical attention.  

## 2014-03-24 ENCOUNTER — Telehealth: Payer: Self-pay | Admitting: Internal Medicine

## 2014-03-24 NOTE — Telephone Encounter (Signed)
Relevant patient education assigned to patient using Emmi. ° °

## 2014-04-07 ENCOUNTER — Ambulatory Visit (INDEPENDENT_AMBULATORY_CARE_PROVIDER_SITE_OTHER): Payer: Medicare Other | Admitting: Internal Medicine

## 2014-04-07 ENCOUNTER — Encounter: Payer: Self-pay | Admitting: Internal Medicine

## 2014-04-07 VITALS — BP 152/70 | HR 82 | Temp 98.1°F | Resp 16 | Wt 232.0 lb

## 2014-04-07 DIAGNOSIS — E119 Type 2 diabetes mellitus without complications: Secondary | ICD-10-CM | POA: Diagnosis not present

## 2014-04-07 DIAGNOSIS — I2510 Atherosclerotic heart disease of native coronary artery without angina pectoris: Secondary | ICD-10-CM | POA: Diagnosis not present

## 2014-04-07 LAB — HEMOGLOBIN A1C: Hgb A1c MFr Bld: 6.8 % — ABNORMAL HIGH (ref 4.6–6.5)

## 2014-04-07 MED ORDER — GLUCAGON (RDNA) 1 MG IJ KIT: 1.0000 mg | PACK | Freq: Once | INTRAMUSCULAR | Status: DC | PRN

## 2014-04-07 NOTE — Patient Instructions (Addendum)
Continue Metformin 1000 mg 2x a day. Continue the same pump settings: - basal rates:  12 am: 1.15 u/h   6 am: 1.300 u/h - ICR: 3 - ISF: 20 - target: 12 am: 120   6 am: 110 - IOB: 4h   Please decrease the evening snack bolus by half.  Please return in 3 month with your sugar log.   Please stop at the lab.

## 2014-04-07 NOTE — Progress Notes (Signed)
Patient ID: Brent Bannister., male   DOB: 12/31/1945, 68 y.o.   MRN: [id number]  HPI: Brent Livecchi. is a 68 y.o.-year-old male, returning for f/u for DM2, dx 2003, insulin-dependent since 2011, uncontrolled, with complications (coronary artery disease, peripheral neuropathy). He was told he might have developed it from [place] (agent [place]). He gets his meds from the [place] (needs his Rx printed). Last visit 75moago.  He had an episode of passing out after drinking 2 mixed drinks >> wife woke him up with difficulty >> checked sugar =199.   Last hemoglobin A1c was:  Lab Results  Component Value Date   HGBA1C 6.3 01/06/2014   HGBA1C 6.5 09/06/2013   HGBA1C 10.0* 01/09/2013  - at the [facility] 04/12/2013: HbA1C was 7.1%.  - Previously, HbA1c was 11%.   He started a T-slim pump on 08/28/2013. No CGM. DM educator: RNelia Shi  Current regimen:  - Metformin 1000 mg po bid Pump Settings: - basal rates : TDD 30.3 units  12 am: 1.15 u/h   6 am: 1.175 u/h >> 1.225 >> 1.300 since last visit - ICR: 3 - ISF: 20 - target: 12 am: 120   6 am: 110 - IOB: 4h  He boluses for snacks too Bolus tdd: ~45-60 units - changes infusion site q 3 days, no pb with the infusion site  Pt checks his sugars 4x a day >> reviewed the pump and meter - ave 7 days: 142 - am: 96-230 >> 120-172 >> 156-274 >> 133-205 >> 117-162 >> 108-167 >> 116-209 >> 66 x1, 135-150 - after b'fast: 120-152 >> n/c >> 128 >> n/c - before lunch 88 (lowest, x1) - 190s >> 109-174 >> 119-251, mostly in the low 100s >> 102-204 >> 93-170 >> 92-130s >> 89-156 >> 130-140 - after lunch: 140-220 >>not checking >> 156-188 >> 111-135 >> n/c >> n/c - before dinner: 97-196 >> 147-275 (but rarely >200) >> 111-227 >> 117-304 >> 83-205 (most 140-170) >> 76-151 >> 106-178 >> 142-150 - After dinner 145-268 >> not checking >> not checking >> 122-150 >> 99-188 >> 155-160 - Bedtime 117-233 >> 135-231 (but rarely >200) >> 143-208 >> 92-263 >> 73-152 >>  91-215 >> 150s-160 - nighttime: 40s x1, 56 x1 (in last 2 weeks) - when he takes too much insulin at bedtime! + lows - see above. Highest: 298  Pt does not have chronic kidney disease, last BUN/creatinine was:  Lab Results  Component Value Date   BUN 16 01/09/2013   CREATININE 0.9 01/09/2013  On Lisinopril. Last set of lipids: Lab Results  Component Value Date   CHOL 194 01/09/2013   HDL 25.50* 01/09/2013   LDLCALC 25       02/14/2011   LDLDIRECT 99.9 01/09/2013   TRIG 554.0 Triglyceride is over 400; calculations on Lipids are invalid.* 01/09/2013   CHOLHDL 8 01/09/2013   Pt's last eye exam was in 01/15/2014 (no DR, reportedly) - Dr GDelman Cheadle  Has numbness and tingling in his legs - saw [place] [facility].  Pt also has a history of HL, HTN, anxiety, kidney stones history of hydronephrosis, OSA-on CPAP, history of right distal radius fracture, colon polyps.  ROS: Constitutional: + weight gain, + fatigue, no subjective hyperthermia/hypothermia, no nocturia Eyes: no blurry vision, no xerophthalmia ENT: no sore throat, no nodules palpated in throat, no dysphagia/odynophagia, no hoarseness Cardiovascular: no CP/SOB/palpitations/leg swelling Respiratory: no cough/no SOB Gastrointestinal: no N/V/D/C Musculoskeletal: no muscle aches/no joint aches  Skin: no rashes  I  reviewed pt's medications, allergies, PMH, social hx, family hx and no changes required, except as mentioned above.  PE: BP 152/70  Pulse 82  Temp(Src) 98.1 F (36.7 C) (Oral)  Resp 16  Wt 232 lb (105.235 kg)  SpO2 98% Wt Readings from Last 3 Encounters:  04/07/14 232 lb (105.235 kg)  03/20/14 237 lb (107.502 kg)  01/21/14 230 lb (104.327 kg)   Constitutional: overweight, in NAD Eyes: PERRLA, EOMI, no exophthalmos ENT: moist mucous membranes, no thyromegaly, no cervical lymphadenopathy Cardiovascular: RRR, No MRG Respiratory: CTA B Gastrointestinal: abdomen soft, NT, ND, BS+ Musculoskeletal: no deformities, strength intact  in all 4 Skin: moist, warm, no rashes Neurological: no tremor with outstretched hands, DTR normal in all 4  ASSESSMENT: 1. DM2, insulin-dependent, uncontrolled, with complications - coronary artery disease, history of MI - Peripheral neuropathy * on a T-slim pump  PLAN:  1. Patient with long-standing diabetes, with a much improved hemoglobin A1c after starting on insulin pump >> HbA1c 6.3 at last visit. Sugars controlled, with occasional high's, higher than goal in am and also occasionally after dinner. Still has an imbalance JF:[id number] and bolus TDD, with predominance of bolusing. - We will continue the current settings, except I advised him to decrease his evening bolusing with snacks to avoid nighttime lows. Also, he will see Leonia Reader with DM education for teaching about extended boluses.    Patient Instructions  Continue Metformin 1000 mg 2x a day. Continue the same pump settings: - basal rates:  12 am: 1.15 u/h   6 am: 1.300 u/h - ICR: 3 - ISF: 20 - target: 12 am: 120   6 am: 110 - IOB: 4h   Please decrease the evening snack bolus by half.  Please return in 3 month with your sugar log.   Please stop at the lab. - sent Glucagon EMG kit to his pharmacy and advised him how family should use it if he has impaired consciousness and sugars are low. - check A1c today - I will see him back in 3 months  Office Visit on 04/07/2014  Component Date Value Ref Range Status  . Hemoglobin A1C 04/07/2014 6.8* 4.6 - 6.5 % Final   Glycemic Control Guidelines for People with Diabetes:Non Diabetic:  <6%Goal of Therapy: <7%Additional Action Suggested:  >8%    A1c a little higher, but still at goal. He really needs to start working more on his diet, as we discussed at the time of the visit.

## 2014-04-09 ENCOUNTER — Encounter: Payer: Self-pay | Admitting: Internal Medicine

## 2014-04-21 DIAGNOSIS — N200 Calculus of kidney: Secondary | ICD-10-CM | POA: Diagnosis not present

## 2014-04-21 DIAGNOSIS — N476 Balanoposthitis: Secondary | ICD-10-CM | POA: Diagnosis not present

## 2014-04-21 DIAGNOSIS — Q619 Cystic kidney disease, unspecified: Secondary | ICD-10-CM | POA: Diagnosis not present

## 2014-04-21 DIAGNOSIS — N400 Enlarged prostate without lower urinary tract symptoms: Secondary | ICD-10-CM | POA: Diagnosis not present

## 2014-04-30 ENCOUNTER — Encounter: Payer: Medicare Other | Admitting: Cardiovascular Disease

## 2014-04-30 NOTE — Progress Notes (Signed)
No show

## 2014-05-07 ENCOUNTER — Encounter: Payer: Self-pay | Admitting: Gastroenterology

## 2014-05-28 ENCOUNTER — Ambulatory Visit (INDEPENDENT_AMBULATORY_CARE_PROVIDER_SITE_OTHER): Payer: Medicare Other | Admitting: Internal Medicine

## 2014-05-28 ENCOUNTER — Encounter: Payer: Self-pay | Admitting: Internal Medicine

## 2014-05-28 VITALS — BP 150/70 | HR 79 | Temp 98.4°F | Resp 20 | Ht 72.0 in | Wt 232.0 lb

## 2014-05-28 DIAGNOSIS — M7710 Lateral epicondylitis, unspecified elbow: Secondary | ICD-10-CM | POA: Diagnosis not present

## 2014-05-28 DIAGNOSIS — Z23 Encounter for immunization: Secondary | ICD-10-CM | POA: Diagnosis not present

## 2014-05-28 DIAGNOSIS — E1159 Type 2 diabetes mellitus with other circulatory complications: Secondary | ICD-10-CM

## 2014-05-28 DIAGNOSIS — I2510 Atherosclerotic heart disease of native coronary artery without angina pectoris: Secondary | ICD-10-CM | POA: Diagnosis not present

## 2014-05-28 DIAGNOSIS — M7711 Lateral epicondylitis, right elbow: Secondary | ICD-10-CM

## 2014-05-28 NOTE — Progress Notes (Signed)
Pre visit review using our clinic review tool, if applicable. No additional management support is needed unless otherwise documented below in the visit note. 

## 2014-05-28 NOTE — Progress Notes (Signed)
Subjective:    Patient ID: Brent [name]., male    DOB: 12-Feb-1946, 68 y.o.   MRN: [id number]  HPI  68 year old left handed individual who presents with a six-week history of right lateral elbow pain.  This has been refractory to rest and ibuprofen.  Past Medical History  Diagnosis Date  . CAD (coronary artery disease)   . Myocardial infarction   . HTN (hypertension)   . Hyperlipidemia   . Obesity   . Diabetes mellitus   . Fatigue   . OSA (obstructive sleep apnea)   . Persistent disorder of initiating or maintaining sleep   . Erectile dysfunction   . Nephrolithiasis   . Diverticulosis   . Hemorrhoids   . Personal history of colonic polyps 02/05/2003  . Kidney stones     History   Social History  . Marital Status: Married    Spouse Name: N/A    Number of Children: N/A  . Years of Education: N/A   Occupational History  . Not on file.   Social History Main Topics  . Smoking status: Former Smoker -- 1.50 packs/day for 30 years    Types: Cigarettes    Quit date: 09/19/1980  . Smokeless tobacco: Never Used  . Alcohol Use: 0.0 oz/week     Comment: social  . Drug Use: No  . Sexual Activity: Not on file   Other Topics Concern  . Not on file   Social History Narrative  . No narrative on file    Past Surgical History  Procedure Laterality Date  . Coronary stent placement      stenting of the right coronary artery with followup rotational  atherectomy.   . Cholecystectomy    . Coronary angioplasty    . Orthopedic surgery    . Rotator cuff surg      Bil  . Inguinal hernia repair      right  . Foot surgery      right  . Finger surgery      right    Family History  Problem Relation Age of Onset  . Liver cancer Father   . Lung cancer Father   . Cancer Father   . Heart disease Father   . Heart disease Sister   . Lung cancer Mother   . COPD Mother   . Cancer Mother   . Obesity Mother     Allergies  Allergen Reactions  . Penicillins    REACTION: unspecified    Current Outpatient Prescriptions on File Prior to Visit  Medication Sig Dispense Refill  . aspirin 81 MG tablet Take 81 mg by mouth daily.      Marland Kitchen atorvastatin (LIPITOR) 40 MG tablet Take 40 mg by mouth daily.      . fenofibrate 160 MG tablet Take 160 mg by mouth daily.      . fluticasone (FLONASE) 50 MCG/ACT nasal spray Place 2 sprays into both nostrils daily.  48 g  1  . glucagon (GLUCAGON EMERGENCY) 1 MG injection Inject 1 mg into the muscle once as needed.  1 each  12  . insulin aspart (NOVOLOG) 100 UNIT/ML injection Inject 100 Units into the skin. Insulin pump gives 1 unit for every 15 carbs      . lisinopril (PRINIVIL,ZESTRIL) 5 MG tablet Take 5 mg by mouth daily.        Marland Kitchen LORazepam (ATIVAN) 1 MG tablet TAKE 1 TABLET BY MOUTH EVERY DAY AS NEEDED FOR ANXIETY  90 tablet  1  . metFORMIN (GLUCOPHAGE) 1000 MG tablet Take 1,000 mg by mouth 2 (two) times daily with a meal.      . Multiple Vitamins-Minerals (CENTRUM SILVER PO) 1 tab po qd       . sertraline (ZOLOFT) 100 MG tablet Take 100 mg by mouth daily.       . vitamin B-12 (CYANOCOBALAMIN) 1000 MCG tablet Take 1,000 mcg by mouth daily.        Marland Kitchen zolpidem (AMBIEN) 10 MG tablet Take 10 mg by mouth at bedtime as needed. For insomnia       No current facility-administered medications on file prior to visit.    BP 150/70  Pulse 79  Temp(Src) 98.4 F (36.9 C) (Oral)  Resp 20  Ht 6' (1.829 m)  Wt 232 lb (105.235 kg)  BMI 31.46 kg/m2  SpO2 98%     Review of Systems  Constitutional: Negative for fever, chills, appetite change and fatigue.  HENT: Negative for congestion, dental problem, ear pain, hearing loss, sore throat, tinnitus, trouble swallowing and voice change.   Eyes: Negative for pain, discharge and visual disturbance.  Respiratory: Negative for cough, chest tightness, wheezing and stridor.   Cardiovascular: Negative for chest pain, palpitations and leg swelling.  Gastrointestinal: Negative for  nausea, vomiting, abdominal pain, diarrhea, constipation, blood in stool and abdominal distention.  Genitourinary: Negative for urgency, hematuria, flank pain, discharge, difficulty urinating and genital sores.  Musculoskeletal: Negative for arthralgias, back pain, gait problem, joint swelling, myalgias and neck stiffness.       Significant right lateral elbow discomfort with activity  Skin: Negative for rash.  Neurological: Negative for dizziness, syncope, speech difficulty, weakness, numbness and headaches.  Hematological: Negative for adenopathy. Does not bruise/bleed easily.  Psychiatric/Behavioral: Negative for behavioral problems and dysphoric mood. The patient is not nervous/anxious.        Objective:   Physical Exam  Constitutional: He appears well-developed and well-nourished. No distress.  Musculoskeletal:  Point tenderness over the right lateral elbow region          Assessment & Plan:  Lateral epicondylitis.  Options discussed will try a wrist brace.  Procedure note.  After informed consent and local injection of 1 cc of 80 mg of Depo-Medrol and 2 cc of 1% Xylocaine injected over the lateral epicondyle.  Region. Patient tolerated procedure well  Rehabilitation exercise discussed and information dispensed

## 2014-05-28 NOTE — Patient Instructions (Signed)
Lateral Epicondylitis (Tennis Elbow) with Rehab Lateral epicondylitis involves inflammation and pain around the outer portion of the elbow. The pain is caused by inflammation of the tendons in the forearm that bring back (extend) the wrist. Lateral epicondylitis is also called tennis elbow, because it is very common in tennis players. However, it may occur in any individual who extends the wrist repetitively. If lateral epicondylitis is left untreated, it may become a chronic problem. SYMPTOMS   Pain, tenderness, and inflammation on the outer (lateral) side of the elbow.  Pain or weakness with gripping activities.  Pain that increases with wrist-twisting motions (playing tennis, using a screwdriver, opening a door or a jar).  Pain with lifting objects, including a coffee cup. CAUSES  Lateral epicondylitis is caused by inflammation of the tendons that extend the wrist. Causes of injury may include:  Repetitive stress and strain on the muscles and tendons that extend the wrist.  Sudden change in activity level or intensity.  Incorrect grip in racquet sports.  Incorrect grip size of racquet (often too large).  Incorrect hitting position or technique (usually backhand, leading with the elbow).  Using a racket that is too heavy. RISK INCREASES WITH:  Sports or occupations that require repetitive and/or strenuous forearm and wrist movements (tennis, squash, racquetball, carpentry).  Poor wrist and forearm strength and flexibility.  Failure to warm up properly before activity.  Resuming activity before healing, rehabilitation, and conditioning are complete. PREVENTION   Warm up and stretch properly before activity.  Maintain physical fitness:  Strength, flexibility, and endurance.  Cardiovascular fitness.  Wear and use properly fitted equipment.  Learn and use proper technique and have a coach correct improper technique.  Wear a tennis elbow (counterforce) brace. PROGNOSIS    The course of this condition depends on the degree of the injury. If treated properly, acute cases (symptoms lasting less than 4 weeks) are often resolved in 2 to 6 weeks. Chronic (longer lasting cases) often resolve in 3 to 6 months but may require physical therapy. RELATED COMPLICATIONS   Frequently recurring symptoms, resulting in a chronic problem. Properly treating the problem the first time decreases frequency of recurrence.  Chronic inflammation, scarring tendon degeneration, and partial tendon tear, requiring surgery.  Delayed healing or resolution of symptoms. TREATMENT  Treatment first involves the use of ice and medicine to reduce pain and inflammation. Strengthening and stretching exercises may help reduce discomfort if performed regularly. These exercises may be performed at home if the condition is an acute injury. Chronic cases may require a referral to a physical therapist for evaluation and treatment. Your caregiver may advise a corticosteroid injection to help reduce inflammation. Rarely, surgery is needed. MEDICATION  If pain medicine is needed, nonsteroidal anti-inflammatory medicines (aspirin and ibuprofen), or other minor pain relievers (acetaminophen), are often advised.  Do not take pain medicine for 7 days before surgery.  Prescription pain relievers may be given, if your caregiver thinks they are needed. Use only as directed and only as much as you need.  Corticosteroid injections may be recommended. These injections should be reserved only for the most severe cases, because they can only be given a certain number of times. HEAT AND COLD  Cold treatment (icing) should be applied for 10 to 15 minutes every 2 to 3 hours for inflammation and pain, and immediately after activity that aggravates your symptoms. Use ice packs or an ice massage.  Heat treatment may be used before performing stretching and strengthening activities prescribed by your   caregiver, physical  therapist, or athletic trainer. Use a heat pack or a warm water soak. SEEK MEDICAL CARE IF: Symptoms get worse or do not improve in 2 weeks, despite treatment. EXERCISES  RANGE OF MOTION (ROM) AND STRETCHING EXERCISES - Epicondylitis, Lateral (Tennis Elbow) These exercises may help you when beginning to rehabilitate your injury. Your symptoms may go away with or without further involvement from your physician, physical therapist, or athletic trainer. While completing these exercises, remember:   Restoring tissue flexibility helps normal motion to return to the joints. This allows healthier, less painful movement and activity.  An effective stretch should be held for at least 30 seconds.  A stretch should never be painful. You should only feel a gentle lengthening or release in the stretched tissue. RANGE OF MOTION - Wrist Flexion, Active-Assisted  Extend your right / left elbow with your fingers pointing down.*  Gently pull the back of your hand towards you, until you feel a gentle stretch on the top of your forearm.  Hold this position for __________ seconds. Repeat __________ times. Complete this exercise __________ times per day.  *If directed by your physician, physical therapist or athletic trainer, complete this stretch with your elbow bent, rather than extended. RANGE OF MOTION - Wrist Extension, Active-Assisted  Extend your right / left elbow and turn your palm upwards.*  Gently pull your palm and fingertips back, so your wrist extends and your fingers point more toward the ground.  You should feel a gentle stretch on the inside of your forearm.  Hold this position for __________ seconds. Repeat __________ times. Complete this exercise __________ times per day. *If directed by your physician, physical therapist or athletic trainer, complete this stretch with your elbow bent, rather than extended. STRETCH - Wrist Flexion  Place the back of your right / left hand on a  tabletop, leaving your elbow slightly bent. Your fingers should point away from your body.  Gently press the back of your hand down onto the table by straightening your elbow. You should feel a stretch on the top of your forearm.  Hold this position for __________ seconds. Repeat __________ times. Complete this stretch __________ times per day.  STRETCH - Wrist Extension   Place your right / left fingertips on a tabletop, leaving your elbow slightly bent. Your fingers should point backwards.  Gently press your fingers and palm down onto the table by straightening your elbow. You should feel a stretch on the inside of your forearm.  Hold this position for __________ seconds. Repeat __________ times. Complete this stretch __________ times per day.  STRENGTHENING EXERCISES - Epicondylitis, Lateral (Tennis Elbow) These exercises may help you when beginning to rehabilitate your injury. They may resolve your symptoms with or without further involvement from your physician, physical therapist, or athletic trainer. While completing these exercises, remember:   Muscles can gain both the endurance and the strength needed for everyday activities through controlled exercises.  Complete these exercises as instructed by your physician, physical therapist or athletic trainer. Increase the resistance and repetitions only as guided.  You may experience muscle soreness or fatigue, but the pain or discomfort you are trying to eliminate should never worsen during these exercises. If this pain does get worse, stop and make sure you are following the directions exactly. If the pain is still present after adjustments, discontinue the exercise until you can discuss the trouble with your caregiver. STRENGTH - Wrist Flexors  Sit with your right / left forearm palm-up   and fully supported on a table or countertop. Your elbow should be resting below the height of your shoulder. Allow your wrist to extend over the edge of  the surface.  Loosely holding a __________ weight, or a piece of rubber exercise band or tubing, slowly curl your hand up toward your forearm.  Hold this position for __________ seconds. Slowly lower the wrist back to the starting position in a controlled manner. Repeat __________ times. Complete this exercise __________ times per day.  STRENGTH - Wrist Extensors  Sit with your right / left forearm palm-down and fully supported on a table or countertop. Your elbow should be resting below the height of your shoulder. Allow your wrist to extend over the edge of the surface.  Loosely holding a __________ weight, or a piece of rubber exercise band or tubing, slowly curl your hand up toward your forearm.  Hold this position for __________ seconds. Slowly lower the wrist back to the starting position in a controlled manner. Repeat __________ times. Complete this exercise __________ times per day.  STRENGTH - Ulnar Deviators  Stand with a ____________________ weight in your right / left hand, or sit while holding a rubber exercise band or tubing, with your healthy arm supported on a table or countertop.  Move your wrist, so that your pinkie travels toward your forearm and your thumb moves away from your forearm.  Hold this position for __________ seconds and then slowly lower the wrist back to the starting position. Repeat __________ times. Complete this exercise __________ times per day STRENGTH - Radial Deviators  Stand with a ____________________ weight in your right / left hand, or sit while holding a rubber exercise band or tubing, with your injured arm supported on a table or countertop.  Raise your hand upward in front of you or pull up on the rubber tubing.  Hold this position for __________ seconds and then slowly lower the wrist back to the starting position. Repeat __________ times. Complete this exercise __________ times per day. STRENGTH - Forearm Supinators   Sit with your  right / left forearm supported on a table, keeping your elbow below shoulder height. Rest your hand over the edge, palm down.  Gently grip a hammer or a soup ladle.  Without moving your elbow, slowly turn your palm and hand upward to a "thumbs-up" position.  Hold this position for __________ seconds. Slowly return to the starting position. Repeat __________ times. Complete this exercise __________ times per day.  STRENGTH - Forearm Pronators   Sit with your right / left forearm supported on a table, keeping your elbow below shoulder height. Rest your hand over the edge, palm up.  Gently grip a hammer or a soup ladle.  Without moving your elbow, slowly turn your palm and hand upward to a "thumbs-up" position.  Hold this position for __________ seconds. Slowly return to the starting position. Repeat __________ times. Complete this exercise __________ times per day.  STRENGTH - Grip  Grasp a tennis ball, a dense sponge, or a large, rolled sock in your hand.  Squeeze as hard as you can, without increasing any pain.  Hold this position for __________ seconds. Release your grip slowly. Repeat __________ times. Complete this exercise __________ times per day.  STRENGTH - Elbow Extensors, Isometric  Stand or sit upright, on a firm surface. Place your right / left arm so that your palm faces your stomach, and it is at the height of your waist.  Place your opposite hand   on the underside of your forearm. Gently push up as your right / left arm resists. Push as hard as you can with both arms, without causing any pain or movement at your right / left elbow. Hold this stationary position for __________ seconds. Gradually release the tension in both arms. Allow your muscles to relax completely before repeating. Document Released: 09/05/2005 Document Revised: 01/20/2014 Document Reviewed: 12/18/2008 [facility] Patient Information 2015 [place], [place]. This information is not intended to replace advice  given to you by your health care provider. Make sure you discuss any questions you have with your health care provider. There is a long long he says and that is fed as tolerated.  Back on her prior notes for her.  Her prognosis was very well since his [place] at her prior dose and within a week to resume it and thinks

## 2014-06-09 ENCOUNTER — Telehealth: Payer: Self-pay | Admitting: *Deleted

## 2014-06-09 NOTE — Telephone Encounter (Signed)
Received lab results from study pt is in. CPK elevated. Per Dr. Angelena Form pt should hold Lipitor for one month and repeat LFT's/CPK in one month. I placed call to pt and left message for him to call back.

## 2014-06-09 NOTE — Telephone Encounter (Signed)
Spoke with pt and gave him instructions from Dr. Angelena Form. He will hold Atorvastatin starting today and have lab work done when here for appt with Dr. Angelena Form on July 10, 2014.

## 2014-06-13 ENCOUNTER — Encounter: Payer: Self-pay | Admitting: Cardiology

## 2014-07-02 ENCOUNTER — Other Ambulatory Visit: Payer: Self-pay | Admitting: Pulmonary Disease

## 2014-07-08 ENCOUNTER — Ambulatory Visit (INDEPENDENT_AMBULATORY_CARE_PROVIDER_SITE_OTHER): Payer: Medicare Other | Admitting: Internal Medicine

## 2014-07-08 ENCOUNTER — Encounter: Payer: Self-pay | Admitting: Internal Medicine

## 2014-07-08 VITALS — BP 164/66 | HR 78 | Temp 97.9°F | Resp 12 | Wt 234.0 lb

## 2014-07-08 DIAGNOSIS — I2510 Atherosclerotic heart disease of native coronary artery without angina pectoris: Secondary | ICD-10-CM | POA: Diagnosis not present

## 2014-07-08 DIAGNOSIS — E1159 Type 2 diabetes mellitus with other circulatory complications: Secondary | ICD-10-CM | POA: Diagnosis not present

## 2014-07-08 DIAGNOSIS — E1151 Type 2 diabetes mellitus with diabetic peripheral angiopathy without gangrene: Secondary | ICD-10-CM

## 2014-07-08 LAB — LDL CHOLESTEROL, DIRECT: LDL DIRECT: 153.7 mg/dL

## 2014-07-08 LAB — HEMOGLOBIN A1C: Hgb A1c MFr Bld: 6.5 % (ref 4.6–6.5)

## 2014-07-08 LAB — COMPREHENSIVE METABOLIC PANEL
ALT: 40 U/L (ref 0–53)
AST: 38 U/L — ABNORMAL HIGH (ref 0–37)
Albumin: 3.8 g/dL (ref 3.5–5.2)
Alkaline Phosphatase: 75 U/L (ref 39–117)
BILIRUBIN TOTAL: 0.6 mg/dL (ref 0.2–1.2)
BUN: 19 mg/dL (ref 6–23)
CO2: 22 meq/L (ref 19–32)
CREATININE: 1 mg/dL (ref 0.4–1.5)
Calcium: 9.7 mg/dL (ref 8.4–10.5)
Chloride: 105 mEq/L (ref 96–112)
GFR: 76.79 mL/min (ref >60.00)
Glucose, Bld: 139 mg/dL — ABNORMAL HIGH (ref 70–99)
Potassium: 4.2 mEq/L (ref 3.5–5.1)
Sodium: 136 mEq/L (ref 135–145)
Total Protein: 7.7 g/dL (ref 6.0–8.3)

## 2014-07-08 LAB — LIPID PANEL
CHOL/HDL RATIO: 8
Cholesterol: 235 mg/dL — ABNORMAL HIGH (ref 0–200)
HDL: 31.1 mg/dL — AB (ref >39.00)
NonHDL: 203.9
Triglycerides: 338 mg/dL — ABNORMAL HIGH (ref 0.0–149.0)
VLDL: 67.6 mg/dL — AB (ref 0.0–40.0)

## 2014-07-08 NOTE — Patient Instructions (Addendum)
Continue Metformin 1000 mg 2x a day. Change the pump settings as follows: - basal rates:  12 am: 1.15 u/h   6 am: 1.300 u/h - ICR: 3, except 9:30 pm - 12 am: 7 - ISF: 20 - target: 12 am: 120   6 am: 110 - IOB: 4h  Please return in 3 month with your sugar log.  Please stop at the lab.

## 2014-07-08 NOTE — Progress Notes (Signed)
Patient ID: Brent [name]., male   DOB: [birth year], 68 y.o.   MRN: [id number]  HPI: Brent [name]. is a 68 y.o.-year-old male, returning for f/u for DM2, dx 2003, insulin-dependent since 2011, uncontrolled, with complications (coronary artery disease, peripheral neuropathy). He was told he might have developed it from [place] (agent [place]). He gets his meds from the [place] (needs his Rx printed). Last visit 3 mo ago.  Last hemoglobin A1c was:  Lab Results  Component Value Date   HGBA1C 6.8* 04/07/2014   HGBA1C 6.2* 01/13/2014   HGBA1C 6.3 01/06/2014  - at the [facility]: 04/12/2013: HbA1C was 7.1%.  - Previously, HbA1c was 11%.   He started a T-slim pump on 08/28/2013. No CGM. DM educator: Nelia Shi.  Current regimen:  - Metformin 1000 mg po bid Pump Settings: - basal rates : TDD 30.3 units  12 am: 1.15 u/h   6 am: 1.300  - ICR: 3 - ISF: 20 - target: 12 am: 120   6 am: 110 - IOB: 4h  He boluses for snacks too. Bolus tdd: ~45-60 units - changes infusion site q 3 days, no pb with the infusion site  He tells me he drops his sugars at night to 60s-70s if he boluses with a snack at night.  Pt checks his sugars 4x a day - ave 134: - am: 96-230 >> 120-172 >> 156-274 >> 133-205 >> 117-162 >> 108-167 >> 116-209 >> 66 x1, 135-150 >> 112-136 - after b'fast: 120-152 >> n/c >> 128 >> n/c - before lunch 88 (lowest, x1) - 190s >> 109-174 >> 119-251, mostly in the low 100s >> 102-204 >> 93-170 >> 92-130s >> 89-156 >> 130-140 >> 125-135 - after lunch: 140-220 >>not checking >> 156-188 >> 111-135 >> n/c >> n/c - before dinner: 97-196 >> 147-275 (but rarely >200) >> 111-227 >> 117-304 >> 83-205 (most 140-170) >> 76-151 >> 106-178 >> 142-150 >> 135-145 - After dinner 145-268 >> not checking >> not checking >> 122-150 >> 99-188 >> 155-160 >> n/c - Bedtime 117-233 >> 135-231 (but rarely >200) >> 143-208 >> 92-263 >> 73-152 >> 91-215 >> 150s-160 >> 150-160 - nighttime: 40s x1, 56 x1 (in last 2  weeks) - when he takes too much insulin at bedtime! >> 60-70 sometimes + lows - see above. Highest: 298 >> 300s (forgot insulin)  Pt does not have chronic kidney disease, last BUN/creatinine was:  Lab Results  Component Value Date   BUN 16 01/09/2013   CREATININE 0.9 01/09/2013  On Lisinopril. Last set of lipids: Lab Results  Component Value Date   CHOL 194 01/09/2013   HDL 25.50* 01/09/2013   LDLCALC 25       02/14/2011   LDLDIRECT 99.9 01/09/2013   TRIG 554.0 Triglyceride is over 400; calculations on Lipids are invalid.* 01/09/2013   CHOLHDL 8 01/09/2013  He was taken off statin by his cardiologist. Pt's last eye exam was in 01/15/2014 (no DR, reportedly) - Dr Delman Cheadle.  Has numbness and tingling in his legs - saw [facility].  Pt also has a history of HL, HTN, anxiety, kidney stones history of hydronephrosis, OSA-on CPAP, history of right distal radius fracture, colon polyps.  ROS: Constitutional: no weight gain, + fatigue, + subjective hyperthermia, no nocturia Eyes: no blurry vision, no xerophthalmia ENT: no sore throat, no nodules palpated in throat, no dysphagia/odynophagia, no hoarseness Cardiovascular: no CP/SOB/palpitations/leg swelling Respiratory: no cough/no SOB Gastrointestinal: no N/V/D/C Musculoskeletal: no muscle aches/no joint aches  Skin: no rashes  I reviewed pt's medications, allergies, PMH, social hx, family hx and no changes required, except as mentioned above.  PE: BP 164/66  Pulse 78  Temp(Src) 97.9 F (36.6 C) (Oral)  Resp 12  Wt 234 lb (106.142 kg)  SpO2 97% Wt Readings from Last 3 Encounters:  07/08/14 234 lb (106.142 kg)  05/28/14 232 lb (105.235 kg)  04/07/14 232 lb (105.235 kg)   Constitutional: overweight, in NAD Eyes: PERRLA, EOMI, no exophthalmos ENT: moist mucous membranes, no thyromegaly, no cervical lymphadenopathy Cardiovascular: RRR, No MRG Respiratory: CTA B Gastrointestinal: abdomen soft, NT, ND, BS+ Musculoskeletal: no  deformities, strength intact in all 4 Skin: moist, warm, no rashes Neurological: no tremor with outstretched hands, DTR normal in all 4  ASSESSMENT: 1. DM2, insulin-dependent, uncontrolled, with complications - coronary artery disease, history of MI - Peripheral neuropathy * on a T-slim pump  PLAN:  1. Patient with long-standing diabetes, with a much improved hemoglobin A1c after starting on insulin pump >> HbA1c 6.8 at last visit. He has low sugars at night after bolusing with a snack, and did not decrease his evening bolusing by half as advised at last visit >> will change his ICR with that snack.  Patient Instructions  Continue Metformin 1000 mg 2x a day. Change the pump settings as follows: - basal rates:  12 am: 1.15 u/h   6 am: 1.300 u/h - ICR: 3, except 9:30 pm - 12 am: 7 - ISF: 20 - target: 12 am: 120   6 am: 110 - IOB: 4h  Please return in 3 month with your sugar log.  Please stop at the lab.   - check A1c today, will add lipid panel (ate toast 2h ago) and CMP. - he had the flu vaccine this season - I will see him back in 3 months  Office Visit on 07/08/2014  Component Date Value Ref Range Status  . Hemoglobin A1C 07/08/2014 6.5  4.6 - 6.5 % Final   Glycemic Control Guidelines for People with Diabetes:Non Diabetic:  <6%Goal of Therapy: <7%Additional Action Suggested:  >8%   . Sodium 07/08/2014 136  135 - 145 mEq/L Final  . Potassium 07/08/2014 4.2  3.5 - 5.1 mEq/L Final  . Chloride 07/08/2014 105  96 - 112 mEq/L Final  . CO2 07/08/2014 22  19 - 32 mEq/L Final  . Glucose, Bld 07/08/2014 139* 70 - 99 mg/dL Final  . BUN 07/08/2014 19  6 - 23 mg/dL Final  . Creatinine, Ser 07/08/2014 1.0  0.4 - 1.5 mg/dL Final  . Total Bilirubin 07/08/2014 0.6  0.2 - 1.2 mg/dL Final  . Alkaline Phosphatase 07/08/2014 75  39 - 117 U/L Final  . AST 07/08/2014 38* 0 - 37 U/L Final  . ALT 07/08/2014 40  0 - 53 U/L Final  . Total Protein 07/08/2014 7.7  6.0 - 8.3 g/dL Final  . Albumin  07/08/2014 3.8  3.5 - 5.2 g/dL Final  . Calcium 07/08/2014 9.7  8.4 - 10.5 mg/dL Final  . GFR 07/08/2014 76.79  >60.00 mL/min Final  . Cholesterol 07/08/2014 235* 0 - 200 mg/dL Final   ATP III Classification       Desirable:  < 200 mg/dL               Borderline High:  200 - 239 mg/dL          High:  > = 240 mg/dL  . Triglycerides 07/08/2014 338.0* 0.0 - 149.0 mg/dL Final  Normal:  <150 mg/dLBorderline High:  150 - 199 mg/dL  . HDL 07/08/2014 31.10* >39.00 mg/dL Final  . VLDL 07/08/2014 67.6* 0.0 - 40.0 mg/dL Final  . Total CHOL/HDL Ratio 07/08/2014 8   Final                  Men          Women1/2 Average Risk     3.4          3.3Average Risk          5.0          4.42X Average Risk          9.6          7.13X Average Risk          15.0          11.0                      . NonHDL 07/08/2014 203.90   Final   NOTE:  Non-HDL goal should be 30 mg/dL higher than patient's LDL goal (i.e. LDL goal of < 70 mg/dL, would have non-HDL goal of < 100 mg/dL)  . Direct LDL 07/08/2014 153.7   Final   Optimal:  <100 mg/dLNear or Above Optimal:  100-129 mg/dLBorderline High:  130-159 mg/dLHigh:  160-189 mg/dLVery High:  >190 mg/dL   HbA1c better! AST high, but improved. TG high, but improved. Continue Fenofibrate. LDL worsened. Management per cardiology. He sees Dr Angelena Form, I will FWD him the note.

## 2014-07-10 ENCOUNTER — Ambulatory Visit (INDEPENDENT_AMBULATORY_CARE_PROVIDER_SITE_OTHER): Payer: Medicare Other | Admitting: Cardiovascular Disease

## 2014-07-10 ENCOUNTER — Encounter: Payer: Self-pay | Admitting: Cardiovascular Disease

## 2014-07-10 VITALS — BP 130/66 | HR 73 | Ht 72.0 in | Wt 235.1 lb

## 2014-07-10 DIAGNOSIS — E785 Hyperlipidemia, unspecified: Secondary | ICD-10-CM | POA: Diagnosis not present

## 2014-07-10 DIAGNOSIS — I2510 Atherosclerotic heart disease of native coronary artery without angina pectoris: Secondary | ICD-10-CM

## 2014-07-10 DIAGNOSIS — I10 Essential (primary) hypertension: Secondary | ICD-10-CM

## 2014-07-10 LAB — CK: Total CK: 425 U/L — ABNORMAL HIGH (ref 7–232)

## 2014-07-10 NOTE — Patient Instructions (Signed)
Your physician wants you to follow-up in:  6 months. You will receive a reminder letter in the mail two months in advance. If you don't receive a letter, please call our office to schedule the follow-up appointment.   

## 2014-07-10 NOTE — Progress Notes (Signed)
History of Present Illness: 68 yo male with history of CAD, HLD, DM, OSA who is here today for cardiac follow up. He has been followed in the past by Dr. Lia Foyer. He had angioplasty of his RCA in 1990. He had a bare metal stent placed in the RCA in 2000 followed by rotational atherectomy shortly after for stent restenosis. Last cath in May 2012 per Dr. Lia Foyer with stable moderate diffuse CAD. (70% mid LAD, 80% diagonal, 70% Ramus, 40% mid to distal RCA stent restenosis). He has done well with medical management. He is seeing an endocrinologist for his DM. He is on CPAP and see Dr. Gwenette Greet.   He is here today for follow up. He is feeling well. No chest pain or SOB. CPK was elevated on ACCELERATE trial labs. Lipitor held 06/08/14. Now labs in primary care 07/08/14 show elevated LDL at 153. LFTS ok. No CPK repeated yet.    Primary Care Physician: Phoebe Sharps  Last Lipid Profile:Lipid Panel     Component Value Date/Time   CHOL 235* 07/08/2014 1047   TRIG 338.0* 07/08/2014 1047   HDL 31.10* 07/08/2014 1047   CHOLHDL 8 07/08/2014 1047   VLDL 67.6* 07/08/2014 1047   LDLCALC  Value: 25        Total Cholesterol/HDL:CHD Risk Coronary Heart Disease Risk Table                     Men   Women  1/2 Average Risk   3.4   3.3  Average Risk       5.0   4.4  2 X Average Risk   9.6   7.1  3 X Average Risk  23.4   11.0        Use the calculated Patient Ratio above and the CHD Risk Table to determine the patient's CHD Risk.        ATP III CLASSIFICATION (LDL):  <100     mg/dL   Optimal  100-129  mg/dL   Near or Above                    Optimal  130-159  mg/dL   Borderline  160-189  mg/dL   High  >190     mg/dL   Very High 02/14/2011 0147     Past Medical History  Diagnosis Date  . CAD (coronary artery disease)   . Myocardial infarction   . HTN (hypertension)   . Hyperlipidemia   . Obesity   . Diabetes mellitus   . Fatigue   . OSA (obstructive sleep apnea)   . Persistent disorder of initiating or  maintaining sleep   . Erectile dysfunction   . Nephrolithiasis   . Diverticulosis   . Hemorrhoids   . Personal history of colonic polyps 02/05/2003  . Kidney stones     Past Surgical History  Procedure Laterality Date  . Coronary stent placement      stenting of the right coronary artery with followup rotational  atherectomy.   . Cholecystectomy    . Coronary angioplasty    . Orthopedic surgery    . Rotator cuff surg      Bil  . Inguinal hernia repair      right  . Foot surgery      right  . Finger surgery      right    Current Outpatient Prescriptions  Medication Sig Dispense Refill  . aspirin 81 MG tablet Take  81 mg by mouth daily.      . fenofibrate 160 MG tablet Take 160 mg by mouth daily.      . fluticasone (FLONASE) 50 MCG/ACT nasal spray PLACE 2 SPRAYS INTO BOTH NOSTRILS DAILY.  48 g  1  . glucagon (GLUCAGON EMERGENCY) 1 MG injection Inject 1 mg into the muscle once as needed.  1 each  12  . insulin aspart (NOVOLOG) 100 UNIT/ML injection Inject 100 Units into the skin. Insulin pump gives 1 unit for every 15 carbs      . lisinopril (PRINIVIL,ZESTRIL) 5 MG tablet Take 5 mg by mouth daily.        Marland Kitchen LORazepam (ATIVAN) 1 MG tablet TAKE 1 TABLET BY MOUTH EVERY DAY AS NEEDED FOR ANXIETY  90 tablet  1  . metFORMIN (GLUCOPHAGE) 1000 MG tablet Take 1,000 mg by mouth 2 (two) times daily with a meal.      . Multiple Vitamins-Minerals (CENTRUM SILVER PO) 1 tab po qd       . sertraline (ZOLOFT) 100 MG tablet Take 100 mg by mouth daily.       . vitamin B-12 (CYANOCOBALAMIN) 1000 MCG tablet Take 1,000 mcg by mouth daily.        Marland Kitchen zolpidem (AMBIEN) 10 MG tablet Take 10 mg by mouth at bedtime as needed. For insomnia       No current facility-administered medications for this visit.    Allergies  Allergen Reactions  . Penicillins     REACTION: unspecified    History   Social History  . Marital Status: Married    Spouse Name: N/A    Number of Children: N/A  . Years of  Education: N/A   Occupational History  . Not on file.   Social History Main Topics  . Smoking status: Former Smoker -- 1.50 packs/day for 30 years    Types: Cigarettes    Quit date: 09/19/1980  . Smokeless tobacco: Never Used  . Alcohol Use: 0.0 oz/week     Comment: social  . Drug Use: No  . Sexual Activity: Not on file   Other Topics Concern  . Not on file   Social History Narrative  . No narrative on file    Family History  Problem Relation Age of Onset  . Liver cancer Father   . Lung cancer Father   . Cancer Father   . Heart disease Father   . Heart disease Sister   . Lung cancer Mother   . COPD Mother   . Cancer Mother   . Obesity Mother     Review of Systems:  As stated in the HPI and otherwise negative.   BP 130/66  Pulse 73  Ht 6' (1.829 m)  Wt 235 lb 1.9 oz (106.65 kg)  BMI 31.88 kg/m2  Physical Examination: General: Well developed, well nourished, NAD HEENT: OP clear, mucus membranes moist SKIN: warm, dry. No rashes. Neuro: No focal deficits Musculoskeletal: Muscle strength 5/5 all ext Psychiatric: Mood and affect normal Neck: No JVD, no carotid bruits, no thyromegaly, no lymphadenopathy. Lungs:Clear bilaterally, no wheezes, rhonci, crackles Cardiovascular: Regular rate and rhythm. No murmurs, gallops or rubs. Abdomen:Soft. Bowel sounds present. Non-tender.  Extremities: No lower extremity edema. Pulses are 2 + in the bilateral DP/PT.  Stress myoview 09/25/13: Rest Procedure: Myocardial perfusion imaging was performed at rest 45 minutes following the intravenous administration of Technetium 42m Sestamibi.  Rest ECG: NSR nonspecific ST/T wave changes  Stress Procedure: The patient exercised on  the treadmill utilizing the Bruce Protocol for 8:30 minutes. The patient stopped due to SOB and denied any chest pain. Technetium 74m Sestamibi was injected at peak exercise and myocardial perfusion imaging was performed after a brief delay.  Stress ECG:  Significant ST abnormalities consistent with ischemia.  QPS  Raw Data Images: Patient motion noted.  Stress Images: Normal homogeneous uptake in all areas of the myocardium.  Rest Images: Normal homogeneous uptake in all areas of the myocardium.  Subtraction (SDS): Normal  Transient Ischemic Dilatation (Normal <1.22): 0.97  Lung/Heart Ratio (Normal <0.45): 0.24  Quantitative Gated Spect Images  QGS EDV: 115 ml  QGS ESV: 55 ml  Impression  Exercise Capacity: Fair exercise capacity.  BP Response: Hypertensive blood pressure response.  Clinical Symptoms: There is dyspnea.  ECG Impression: Significant ST abnormalities consistent with ischemia.  Comparison with Prior Nuclear Study: No images to compare  Overall Impression: Low risk stress nuclear study No perfusion defects. Hypertensive response to exercise. Baseline ECG with nonspecific ST/T wave changes but 17mm ST segment depression in inferior and lateral leads with stress.  LV Ejection Fraction: 52%. LV Wall Motion: NL LV Function; NL Wall Motion  Cardiac cath 02/06/11: 1. Ventriculography done in the RAO projection reveals preserved  global systolic function. No definite segmental wall motion  abnormalities are identified. There may be trace mitral  regurgitation, but none of hemodynamic significance.  2. The left main is slightly tapered, but without significant focal  narrowing.  3. The LAD is fairly heavily calcified. There is an eccentric plaque  of approximately 75% noted in the midvessel. This is just after  the septal and overlying the takeoff of the small first diagonal.  The small first diagonal has about 80% narrowing, unchanged from  the previous study. The mid and distal LAD has about 50%  narrowing, is calcified, but is largely unchanged from prior  studies and the vessel wraps the apex.  4. The circumflex is a large-caliber vessel. There is a ramus  intermedius that has 70-75% proximal segmental plaque. The vessel    beyond this is widely patent. When compared carefully to the  previous study, there does not appear to be a substantial change.  The AV circumflex is a large-caliber vessel without significant  focal narrowing. It is also calcified.  5. The right coronary artery has been previously stented. There is a  long stent in the mid vessel extending down to the distal vessel  overlapping the PDA takeoff, and extending into the posterolateral  segment across the first PLL-1. The stent has been previously  opened, then subsequently rotobladed. The vessel remains widely  patent with about 40% diffuse in-stent re-narrowing, but without  significant focal narrowing.  CONCLUSION:  1. Well-preserved left ventricular systolic function.  2. Continued 70-75% eccentric stenosis of the left anterior descending  artery, largely unchanged from 2003.  3. 70-75% proximal ramus intermedius stenosis, again appears unchanged  from 2003.  4. Prior extensive stenting of the right coronary artery with  subsequent treatment with rotational atherectomy with continued  patency.  EKG: NSR, rate 73 bpm.   Assessment and Plan:   1. CAD: Known moderate CAD. No recent chest pains. Continue ASA, statin, Ace-inh.      2. HYPERLIPIDEMIA: He had been in the ACCELERATE trial. Off of lipitor now for one month with elevated CPK in study labs. LDL now over 150, had been 99 on atorvastatin. LFTs 07/08/14 were ok. Will check CPK today. Will discuss restarting atorvastatin depending  on results of CPK.    3. HTN: BP controlled. No changes  4. DM: Per primary care

## 2014-07-15 ENCOUNTER — Telehealth: Payer: Self-pay | Admitting: Cardiovascular Disease

## 2014-07-15 DIAGNOSIS — R748 Abnormal levels of other serum enzymes: Secondary | ICD-10-CM

## 2014-07-15 NOTE — Telephone Encounter (Signed)
Pat,  Would you let him know that I reviewed with Dr. Inda Merlin and he would like to hold his statin and repeat the CK level in 6 weeks. Can we put in for the repeat CK level in 6 weeks and let Mr. Shonk know? It is not clear what is making his CK level high but I would like for him to f/u with Dr. Inda Merlin to discuss further. Thanks, chris

## 2014-07-15 NOTE — Telephone Encounter (Signed)
New problems   Pt need to speak to nurse concerning results of a liver function test. Please call pt.

## 2014-07-15 NOTE — Telephone Encounter (Signed)
Informed patient of McAlhany's note from [facility] lab. (Notes Recorded by Burnell Blanks, MD on 07/11/2014 at 2:36 PM His CK is still elevated off of statins for one month. Can we let him know? I will forward to Dr. Inda Merlin and Dr. Cruzita Lederer to see if anyone has any ideas about where to go from here. Will not restart statin.)  He would like to know next step in plan of care.  I explained that I would forward this to Dr. Angelena Form and his nurse. He verbalized understanding.

## 2014-07-16 NOTE — Telephone Encounter (Signed)
Spoke with pt and reviewed recommendations from Dr. Angelena Form with him. He will come in for repeat CK on August 26, 2014

## 2014-07-31 ENCOUNTER — Encounter: Payer: Self-pay | Admitting: Pulmonary Disease

## 2014-07-31 ENCOUNTER — Ambulatory Visit (INDEPENDENT_AMBULATORY_CARE_PROVIDER_SITE_OTHER): Payer: Medicare Other | Admitting: Pulmonary Disease

## 2014-07-31 VITALS — BP 138/60 | HR 88 | Temp 97.3°F | Ht 72.0 in | Wt 239.8 lb

## 2014-07-31 DIAGNOSIS — G4733 Obstructive sleep apnea (adult) (pediatric): Secondary | ICD-10-CM | POA: Diagnosis not present

## 2014-07-31 DIAGNOSIS — I2510 Atherosclerotic heart disease of native coronary artery without angina pectoris: Secondary | ICD-10-CM | POA: Diagnosis not present

## 2014-07-31 NOTE — Progress Notes (Signed)
   Subjective:    Patient ID: Brent [name]., male    DOB: Feb 19, 1946, 68 y.o.   MRN: [id number]  HPI The patient comes in today for follow-up of his obstructive sleep apnea. He is wearing C Pap compliantly, and is having no issues with his mask fit or pressure. He feels that he sleeps adequately, and has no daytime sleepiness. His weight has gone up 9 pounds since last visit, and I've encouraged him to work on this.   Review of Systems  Constitutional: Negative for fever and unexpected weight change.  HENT: Negative for congestion, dental problem, ear pain, nosebleeds, postnasal drip, rhinorrhea, sinus pressure, sneezing, sore throat and trouble swallowing.   Eyes: Negative for redness and itching.  Respiratory: Negative for cough, chest tightness, shortness of breath and wheezing.   Cardiovascular: Negative for palpitations and leg swelling.  Gastrointestinal: Negative for nausea and vomiting.  Genitourinary: Negative for dysuria.  Musculoskeletal: Negative for joint swelling.  Skin: Negative for rash.  Neurological: Negative for headaches.  Hematological: Does not bruise/bleed easily.  Psychiatric/Behavioral: Negative for dysphoric mood. The patient is not nervous/anxious.        Objective:   Physical Exam Obese male in no acute distress Nose without purulence or discharge noted Neck without lymphadenopathy or thyromegaly No skin breakdown or pressure necrosis from the CPAP mask Lower extremities with mild edema, no cyanosis Alert and oriented, moves all 4 extremities.       Assessment & Plan:

## 2014-07-31 NOTE — Assessment & Plan Note (Signed)
The patient feels that he is doing very well overall with his C Pap device, and is not having any issues with mask fit or pressure. He is satisfied with his sleep and daytime alertness. I have asked him to keep up with his mask changes and supplies, and to work aggressively on weight loss. We will also get a download off his device to make sure things are going well.

## 2014-07-31 NOTE — Patient Instructions (Signed)
Will get download off your machine to check on things.  Just take your card by your home care company at your convenience. Work on weight loss followup with me again in one year.

## 2014-08-11 ENCOUNTER — Telehealth: Payer: Self-pay | Admitting: Internal Medicine

## 2014-08-11 NOTE — Telephone Encounter (Signed)
Please read note below and advise.  

## 2014-08-11 NOTE — Telephone Encounter (Signed)
I only made 1 change at last visit, that should not affect him if he does not eat after 9:30... But if he does, let's change setting as follows (in bold): - basal rates:  12 am: 1.15 u/h 6 am: 1.300 u/h - Insulin to carb ratio: 3, except 9:30 pm - 12 am: 7 >> 5 - Insulin sensitivity factor: 20 - target: 12 am: 120  6 am: 110

## 2014-08-11 NOTE — Telephone Encounter (Signed)
Patient states Dr. Cruzita Lederer changed the formula in his pump and he has been noticing in the AM blood sugars are high 150 average   Please advise patient    Thank you

## 2014-08-11 NOTE — Telephone Encounter (Signed)
Called pt. He does not know how to change the ICR on his pump. Pt to come in the morning to see Brent [name] to learn how to change the settings on his pump. Be advised.

## 2014-08-12 ENCOUNTER — Encounter: Payer: Medicare Other | Admitting: Nutrition

## 2014-08-18 ENCOUNTER — Encounter: Payer: Medicare Other | Admitting: Nutrition

## 2014-08-26 ENCOUNTER — Other Ambulatory Visit (INDEPENDENT_AMBULATORY_CARE_PROVIDER_SITE_OTHER): Payer: Medicare Other | Admitting: *Deleted

## 2014-08-26 DIAGNOSIS — R748 Abnormal levels of other serum enzymes: Secondary | ICD-10-CM

## 2014-08-26 LAB — CK: Total CK: 198 U/L (ref 7–232)

## 2014-08-29 ENCOUNTER — Ambulatory Visit (INDEPENDENT_AMBULATORY_CARE_PROVIDER_SITE_OTHER): Payer: Medicare Other | Admitting: Pharmacist

## 2014-08-29 DIAGNOSIS — E785 Hyperlipidemia, unspecified: Secondary | ICD-10-CM

## 2014-08-29 MED ORDER — PITAVASTATIN CALCIUM 4 MG PO TABS: 2.0000 mg | ORAL_TABLET | Freq: Every day | ORAL | Status: DC

## 2014-08-29 NOTE — Progress Notes (Signed)
     History of Present Illness: 68 yo male with history of CAD, HLD, DM, OSA who was referred to the [facility] by Dr. Angelena Form.  Pt recently participated int eh ACCELERATE trial (CTEP inhibitor) and was noted to have elevated CK.  He states he stopped the study drug about 4-5 months ago due to weakness.  His first elevated CK was sometime around the end of September and he stopped Lipitor at that time.  Unfortunately those labs are not available.  His CK in October off Lipitor was still elevated at 425.  A repeat 6 weeks later showed that it had normalized at 198.    RF: CAD, DM, Age  Goal: LDL <70, non-HDL <100 Current Therapies: fenofibrate 160mg  daily Intolerances: Lipitor (increased CK), Crestor 20mg  [facility]  ([facility])   Labs:  08/2014: CK 198  06/2014: TC 235, TG 338, HDL 31, LDL 154, LFTs normal, CK 425 (fenofibrate 160mg )   Current Outpatient Prescriptions  Medication Sig Dispense Refill  . aspirin 81 MG tablet Take 81 mg by mouth daily.    . fenofibrate 160 MG tablet Take 160 mg by mouth daily.    . fluticasone (FLONASE) 50 MCG/ACT nasal spray PLACE 2 SPRAYS INTO BOTH NOSTRILS DAILY. (Patient taking differently: PLACE 2 SPRAYS INTO BOTH NOSTRILS DAILY. as needed) 48 g 1  . glucagon (GLUCAGON EMERGENCY) 1 MG injection Inject 1 mg into the muscle once as needed. 1 each 12  . insulin aspart (NOVOLOG) 100 UNIT/ML injection Inject 100 Units into the skin. Insulin pump gives 1 unit for every 15 carbs    . lisinopril (PRINIVIL,ZESTRIL) 5 MG tablet Take 5 mg by mouth daily.      Marland Kitchen LORazepam (ATIVAN) 1 MG tablet TAKE 1 TABLET BY MOUTH EVERY DAY AS NEEDED FOR ANXIETY 90 tablet 1  . metFORMIN (GLUCOPHAGE) 1000 MG tablet Take 1,000 mg by mouth 2 (two) times daily with a meal.    . Multiple Vitamins-Minerals (CENTRUM SILVER PO) 1 tab po qd     . Pitavastatin Calcium (LIVALO) 4 MG TABS Take 0.5 tablets (2 mg total) by mouth daily. 21 tablet 0    . sertraline (ZOLOFT) 100 MG tablet Take 100 mg by mouth daily.     . vitamin B-12 (CYANOCOBALAMIN) 1000 MCG tablet Take 1,000 mcg by mouth daily.      Marland Kitchen zolpidem (AMBIEN) 10 MG tablet Take 10 mg by mouth at bedtime as needed. For insomnia     No current facility-administered medications for this visit.    Allergies  Allergen Reactions  . Penicillins     REACTION: unspecified

## 2014-08-29 NOTE — Patient Instructions (Signed)
Start Livalo 2mg  daily (1/2 tablet).  If you have any muscle aches or dark urine, please call Gay Filler at [phone].   We will recheck labs in 6 weeks.

## 2014-08-29 NOTE — Assessment & Plan Note (Signed)
Discussed elevated CK with Dr. Angelena Form and Dr. Lia Foyer (research coordinator with [facility]). Given pt's extensive cardiac history, both are okay with retry of statin. Will try Livalo given its lack of hepatic metabolism.  Pt is agreeable.  Will start Livalo 2mg  daily and recheck labs in 6 weeks.  He is advised to call with any muscle aches or dark urine that may be a sign of elevated CK again.

## 2014-09-01 ENCOUNTER — Telehealth: Payer: Self-pay | Admitting: Pulmonary Disease

## 2014-09-01 NOTE — Telephone Encounter (Signed)
Pt called back and Ria Comment gave pt results. He voiced understanding and had no questions

## 2014-09-01 NOTE — Telephone Encounter (Signed)
[id number] x1 

## 2014-09-01 NOTE — Telephone Encounter (Signed)
Let pt know that his download shows good control of his sleep apnea, but is having a little bit of mask leak.  Make sure his cushions are up to date. Thanks.

## 2014-09-08 ENCOUNTER — Encounter: Payer: Self-pay | Admitting: Cardiology

## 2014-09-09 ENCOUNTER — Telehealth: Payer: Self-pay | Admitting: Internal Medicine

## 2014-09-09 MED ORDER — HYDROCODONE-HOMATROPINE 5-1.5 MG/5ML PO SYRP: 5.0000 mL | ORAL_SOLUTION | Freq: Three times a day (TID) | ORAL | Status: DC | PRN

## 2014-09-09 NOTE — Telephone Encounter (Signed)
6 oz

## 2014-09-09 NOTE — Telephone Encounter (Signed)
Pt would like refill on Hycodan cough syrup, please advise.

## 2014-09-09 NOTE — Telephone Encounter (Signed)
Pt notified Rx ready for pickup. Rx printed and signed by Dr. Raliegh Ip.

## 2014-09-09 NOTE — Telephone Encounter (Signed)
Pt called to ask for refill on hydrocodone cholorpheniram [place]

## 2014-09-11 ENCOUNTER — Telehealth: Payer: Self-pay | Admitting: *Deleted

## 2014-09-11 MED ORDER — LORAZEPAM 1 MG PO TABS: ORAL_TABLET | ORAL | Status: DC

## 2014-09-11 NOTE — Telephone Encounter (Signed)
Patient is requesting a refill of Lorazepam 1 mg  Surescripts

## 2014-09-22 ENCOUNTER — Encounter: Payer: Self-pay | Admitting: Internal Medicine

## 2014-09-22 ENCOUNTER — Ambulatory Visit (INDEPENDENT_AMBULATORY_CARE_PROVIDER_SITE_OTHER): Payer: Medicare Other | Admitting: Internal Medicine

## 2014-09-22 DIAGNOSIS — E1159 Type 2 diabetes mellitus with other circulatory complications: Secondary | ICD-10-CM

## 2014-09-22 DIAGNOSIS — E1151 Type 2 diabetes mellitus with diabetic peripheral angiopathy without gangrene: Secondary | ICD-10-CM

## 2014-09-22 DIAGNOSIS — E785 Hyperlipidemia, unspecified: Secondary | ICD-10-CM

## 2014-09-22 DIAGNOSIS — Z23 Encounter for immunization: Secondary | ICD-10-CM | POA: Diagnosis not present

## 2014-09-22 DIAGNOSIS — I10 Essential (primary) hypertension: Secondary | ICD-10-CM | POA: Diagnosis not present

## 2014-09-22 DIAGNOSIS — I2510 Atherosclerotic heart disease of native coronary artery without angina pectoris: Secondary | ICD-10-CM

## 2014-09-22 MED ORDER — LORAZEPAM 1 MG PO TABS
1.0000 mg | ORAL_TABLET | Freq: Two times a day (BID) | ORAL | Status: DC | PRN
Start: 2014-09-22 — End: 2015-03-25

## 2014-09-22 NOTE — Progress Notes (Signed)
Subjective:    Patient ID: Brent Gip., male    DOB: 1945-10-16, 68 y.o.   MRN: [id number]  HPI  69 year old patient who is seen today in follow-up.  He has multiple subspecialists including endocrinology lipidology and cardiology.  He continues to do well.  He continues to tolerate statin therapy Cardiac status has been stable. He does have a history of anxiety and does take lorazepam periodically.  He is requesting a refill.  Past Medical History  Diagnosis Date  . CAD (coronary artery disease)   . Myocardial infarction   . HTN (hypertension)   . Hyperlipidemia   . Obesity   . Diabetes mellitus   . Fatigue   . OSA (obstructive sleep apnea)   . Persistent disorder of initiating or maintaining sleep   . Erectile dysfunction   . Nephrolithiasis   . Diverticulosis   . Hemorrhoids   . Personal history of colonic polyps 02/05/2003  . Kidney stones     History   Social History  . Marital Status: Married    Spouse Name: N/A    Number of Children: N/A  . Years of Education: N/A   Occupational History  . Not on file.   Social History Main Topics  . Smoking status: Former Smoker -- 1.50 packs/day for 30 years    Types: Cigarettes    Quit date: 09/19/1980  . Smokeless tobacco: Never Used  . Alcohol Use: 0.0 oz/week     Comment: social  . Drug Use: No  . Sexual Activity: Not on file   Other Topics Concern  . Not on file   Social History Narrative    Past Surgical History  Procedure Laterality Date  . Coronary stent placement      stenting of the right coronary artery with followup rotational  atherectomy.   . Cholecystectomy    . Coronary angioplasty    . Orthopedic surgery    . Rotator cuff surg      Bil  . Inguinal hernia repair      right  . Foot surgery      right  . Finger surgery      right    Family History  Problem Relation Age of Onset  . Liver cancer Father   . Lung cancer Father   . Cancer Father   . Heart disease Father   .  Heart disease Sister   . Lung cancer Mother   . COPD Mother   . Cancer Mother   . Obesity Mother     Allergies  Allergen Reactions  . Penicillins     REACTION: unspecified    Current Outpatient Prescriptions on File Prior to Visit  Medication Sig Dispense Refill  . aspirin 81 MG tablet Take 81 mg by mouth daily.    . fenofibrate 160 MG tablet Take 160 mg by mouth daily.    . fluticasone (FLONASE) 50 MCG/ACT nasal spray PLACE 2 SPRAYS INTO BOTH NOSTRILS DAILY. (Patient taking differently: PLACE 2 SPRAYS INTO BOTH NOSTRILS DAILY. as needed) 48 g 1  . glucagon (GLUCAGON EMERGENCY) 1 MG injection Inject 1 mg into the muscle once as needed. 1 each 12  . HYDROcodone-homatropine (HYCODAN) 5-1.5 MG/5ML syrup Take 5 mLs by mouth every 8 (eight) hours as needed for cough. 120 mL 0  . insulin aspart (NOVOLOG) 100 UNIT/ML injection Inject 100 Units into the skin. Insulin pump gives 1 unit for every 15 carbs    . lisinopril (PRINIVIL,ZESTRIL) 5 MG  tablet Take 5 mg by mouth daily.      Marland Kitchen LORazepam (ATIVAN) 1 MG tablet TAKE 1 TABLET BY MOUTH EVERY DAY AS NEEDED FOR ANXIETY 90 tablet 1  . metFORMIN (GLUCOPHAGE) 1000 MG tablet Take 1,000 mg by mouth 2 (two) times daily with a meal.    . Multiple Vitamins-Minerals (CENTRUM SILVER PO) 1 tab po qd     . Pitavastatin Calcium (LIVALO) 4 MG TABS Take 0.5 tablets (2 mg total) by mouth daily. 21 tablet 0  . vitamin B-12 (CYANOCOBALAMIN) 1000 MCG tablet Take 1,000 mcg by mouth daily.      Marland Kitchen zolpidem (AMBIEN) 10 MG tablet Take 10 mg by mouth at bedtime as needed. For insomnia     No current facility-administered medications on file prior to visit.    BP 160/80 mmHg  Pulse 81  Temp(Src) 98.1 F (36.7 C) (Oral)  Resp 20  Ht 6' (1.829 m)  Wt 240 lb (108.863 kg)  BMI 32.54 kg/m2  SpO2 98%     Review of Systems  Constitutional: Negative for fever, chills, appetite change and fatigue.  HENT: Negative for congestion, dental problem, ear pain, hearing  loss, sore throat, tinnitus, trouble swallowing and voice change.   Eyes: Negative for pain, discharge and visual disturbance.  Respiratory: Negative for cough, chest tightness, wheezing and stridor.   Cardiovascular: Negative for chest pain, palpitations and leg swelling.  Gastrointestinal: Negative for nausea, vomiting, abdominal pain, diarrhea, constipation, blood in stool and abdominal distention.  Genitourinary: Negative for urgency, hematuria, flank pain, discharge, difficulty urinating and genital sores.  Musculoskeletal: Negative for myalgias, back pain, joint swelling, arthralgias, gait problem and neck stiffness.  Skin: Negative for rash.  Neurological: Negative for dizziness, syncope, speech difficulty, weakness, numbness and headaches.  Hematological: Negative for adenopathy. Does not bruise/bleed easily.  Psychiatric/Behavioral: Negative for behavioral problems and dysphoric mood. The patient is nervous/anxious.        Objective:   Physical Exam  Constitutional: He is oriented to person, place, and time. He appears well-developed.  HENT:  Head: Normocephalic.  Right Ear: External ear normal.  Left Ear: External ear normal.  Eyes: Conjunctivae and EOM are normal.  Neck: Normal range of motion.  Cardiovascular: Normal rate, normal heart sounds and intact distal pulses.   Pulmonary/Chest: Breath sounds normal.  Abdominal: Bowel sounds are normal.  Musculoskeletal: Normal range of motion. He exhibits no edema or tenderness.  Neurological: He is alert and oriented to person, place, and time.  Psychiatric: He has a normal mood and affect. His behavior is normal.          Assessment & Plan:   Anxiety disorder.  Lorazepam refilled Diabetes mellitus.  Follow-up endocrinology Coronary artery disease, stable Hypertension  CPX 6 months

## 2014-09-22 NOTE — Progress Notes (Signed)
Pre visit review using our clinic review tool, if applicable. No additional management support is needed unless otherwise documented below in the visit note. 

## 2014-09-22 NOTE — Patient Instructions (Addendum)
Limit your sodium (Salt) intake    It is important that you exercise regularly, at least 20 minutes 3 to 4 times per week.  If you develop chest pain or shortness of breath seek  medical attention.  You need to lose weight.  Consider a lower calorie diet and regular exercise.  Return in 6 months for follow-up Lateral Epicondylitis (Tennis Elbow) with Rehab Lateral epicondylitis involves inflammation and pain around the outer portion of the elbow. The pain is caused by inflammation of the tendons in the forearm that bring back (extend) the wrist. Lateral epicondylitis is also called tennis elbow, because it is very common in tennis players. However, it may occur in any individual who extends the wrist repetitively. If lateral epicondylitis is left untreated, it may become a chronic problem. SYMPTOMS   Pain, tenderness, and inflammation on the outer (lateral) side of the elbow.  Pain or weakness with gripping activities.  Pain that increases with wrist-twisting motions (playing tennis, using a screwdriver, opening a door or a jar).  Pain with lifting objects, including a coffee cup. CAUSES  Lateral epicondylitis is caused by inflammation of the tendons that extend the wrist. Causes of injury may include:  Repetitive stress and strain on the muscles and tendons that extend the wrist.  Sudden change in activity level or intensity.  Incorrect grip in racquet sports.  Incorrect grip size of racquet (often too large).  Incorrect hitting position or technique (usually backhand, leading with the elbow).  Using a racket that is too heavy. RISK INCREASES WITH:  Sports or occupations that require repetitive and/or strenuous forearm and wrist movements (tennis, squash, racquetball, carpentry).  Poor wrist and forearm strength and flexibility.  Failure to warm up properly before activity.  Resuming activity before healing, rehabilitation, and conditioning are complete. PREVENTION   Warm  up and stretch properly before activity.  Maintain physical fitness:  Strength, flexibility, and endurance.  Cardiovascular fitness.  Wear and use properly fitted equipment.  Learn and use proper technique and have a coach correct improper technique.  Wear a tennis elbow (counterforce) brace. PROGNOSIS  The course of this condition depends on the degree of the injury. If treated properly, acute cases (symptoms lasting less than 4 weeks) are often resolved in 2 to 6 weeks. Chronic (longer lasting cases) often resolve in 3 to 6 months but may require physical therapy. RELATED COMPLICATIONS   Frequently recurring symptoms, resulting in a chronic problem. Properly treating the problem the first time decreases frequency of recurrence.  Chronic inflammation, scarring tendon degeneration, and partial tendon tear, requiring surgery.  Delayed healing or resolution of symptoms. TREATMENT  Treatment first involves the use of ice and medicine to reduce pain and inflammation. Strengthening and stretching exercises may help reduce discomfort if performed regularly. These exercises may be performed at home if the condition is an acute injury. Chronic cases may require a referral to a physical therapist for evaluation and treatment. Your caregiver may advise a corticosteroid injection to help reduce inflammation. Rarely, surgery is needed. MEDICATION  If pain medicine is needed, nonsteroidal anti-inflammatory medicines (aspirin and ibuprofen), or other minor pain relievers (acetaminophen), are often advised.  Do not take pain medicine for 7 days before surgery.  Prescription pain relievers may be given, if your caregiver thinks they are needed. Use only as directed and only as much as you need.  Corticosteroid injections may be recommended. These injections should be reserved only for the most severe cases, because they can only be  given a certain number of times. HEAT AND COLD  Cold treatment  (icing) should be applied for 10 to 15 minutes every 2 to 3 hours for inflammation and pain, and immediately after activity that aggravates your symptoms. Use ice packs or an ice massage.  Heat treatment may be used before performing stretching and strengthening activities prescribed by your caregiver, physical therapist, or athletic trainer. Use a heat pack or a warm water soak. SEEK MEDICAL CARE IF: Symptoms get worse or do not improve in 2 weeks, despite treatment. EXERCISES  RANGE OF MOTION (ROM) AND STRETCHING EXERCISES - Epicondylitis, Lateral (Tennis Elbow) These exercises may help you when beginning to rehabilitate your injury. Your symptoms may go away with or without further involvement from your physician, physical therapist, or athletic trainer. While completing these exercises, remember:   Restoring tissue flexibility helps normal motion to return to the joints. This allows healthier, less painful movement and activity.  An effective stretch should be held for at least 30 seconds.  A stretch should never be painful. You should only feel a gentle lengthening or release in the stretched tissue. RANGE OF MOTION - Wrist Flexion, Active-Assisted  Extend your right / left elbow with your fingers pointing down.*  Gently pull the back of your hand towards you, until you feel a gentle stretch on the top of your forearm.  Hold this position for __________ seconds. Repeat __________ times. Complete this exercise __________ times per day.  *If directed by your physician, physical therapist or athletic trainer, complete this stretch with your elbow bent, rather than extended. RANGE OF MOTION - Wrist Extension, Active-Assisted  Extend your right / left elbow and turn your palm upwards.*  Gently pull your palm and fingertips back, so your wrist extends and your fingers point more toward the ground.  You should feel a gentle stretch on the inside of your forearm.  Hold this position for  __________ seconds. Repeat __________ times. Complete this exercise __________ times per day. *If directed by your physician, physical therapist or athletic trainer, complete this stretch with your elbow bent, rather than extended. STRETCH - Wrist Flexion  Place the back of your right / left hand on a tabletop, leaving your elbow slightly bent. Your fingers should point away from your body.  Gently press the back of your hand down onto the table by straightening your elbow. You should feel a stretch on the top of your forearm.  Hold this position for __________ seconds. Repeat __________ times. Complete this stretch __________ times per day.  STRETCH - Wrist Extension   Place your right / left fingertips on a tabletop, leaving your elbow slightly bent. Your fingers should point backwards.  Gently press your fingers and palm down onto the table by straightening your elbow. You should feel a stretch on the inside of your forearm.  Hold this position for __________ seconds. Repeat __________ times. Complete this stretch __________ times per day.  STRENGTHENING EXERCISES - Epicondylitis, Lateral (Tennis Elbow) These exercises may help you when beginning to rehabilitate your injury. They may resolve your symptoms with or without further involvement from your physician, physical therapist, or athletic trainer. While completing these exercises, remember:   Muscles can gain both the endurance and the strength needed for everyday activities through controlled exercises.  Complete these exercises as instructed by your physician, physical therapist or athletic trainer. Increase the resistance and repetitions only as guided.  You may experience muscle soreness or fatigue, but the pain or discomfort  you are trying to eliminate should never worsen during these exercises. If this pain does get worse, stop and make sure you are following the directions exactly. If the pain is still present after  adjustments, discontinue the exercise until you can discuss the trouble with your caregiver. STRENGTH - Wrist Flexors  Sit with your right / left forearm palm-up and fully supported on a table or countertop. Your elbow should be resting below the height of your shoulder. Allow your wrist to extend over the edge of the surface.  Loosely holding a __________ weight, or a piece of rubber exercise band or tubing, slowly curl your hand up toward your forearm.  Hold this position for __________ seconds. Slowly lower the wrist back to the starting position in a controlled manner. Repeat __________ times. Complete this exercise __________ times per day.  STRENGTH - Wrist Extensors  Sit with your right / left forearm palm-down and fully supported on a table or countertop. Your elbow should be resting below the height of your shoulder. Allow your wrist to extend over the edge of the surface.  Loosely holding a __________ weight, or a piece of rubber exercise band or tubing, slowly curl your hand up toward your forearm.  Hold this position for __________ seconds. Slowly lower the wrist back to the starting position in a controlled manner. Repeat __________ times. Complete this exercise __________ times per day.  STRENGTH - Ulnar Deviators  Stand with a ____________________ weight in your right / left hand, or sit while holding a rubber exercise band or tubing, with your healthy arm supported on a table or countertop.  Move your wrist, so that your pinkie travels toward your forearm and your thumb moves away from your forearm.  Hold this position for __________ seconds and then slowly lower the wrist back to the starting position. Repeat __________ times. Complete this exercise __________ times per day STRENGTH - Radial Deviators  Stand with a ____________________ weight in your right / left hand, or sit while holding a rubber exercise band or tubing, with your injured arm supported on a table or  countertop.  Raise your hand upward in front of you or pull up on the rubber tubing.  Hold this position for __________ seconds and then slowly lower the wrist back to the starting position. Repeat __________ times. Complete this exercise __________ times per day. STRENGTH - Forearm Supinators   Sit with your right / left forearm supported on a table, keeping your elbow below shoulder height. Rest your hand over the edge, palm down.  Gently grip a hammer or a soup ladle.  Without moving your elbow, slowly turn your palm and hand upward to a "thumbs-up" position.  Hold this position for __________ seconds. Slowly return to the starting position. Repeat __________ times. Complete this exercise __________ times per day.  STRENGTH - Forearm Pronators   Sit with your right / left forearm supported on a table, keeping your elbow below shoulder height. Rest your hand over the edge, palm up.  Gently grip a hammer or a soup ladle.  Without moving your elbow, slowly turn your palm and hand upward to a "thumbs-up" position.  Hold this position for __________ seconds. Slowly return to the starting position. Repeat __________ times. Complete this exercise __________ times per day.  STRENGTH - Grip  Grasp a tennis ball, a dense sponge, or a large, rolled sock in your hand.  Squeeze as hard as you can, without increasing any pain.  Hold this  position for __________ seconds. Release your grip slowly. Repeat __________ times. Complete this exercise __________ times per day.  STRENGTH - Elbow Extensors, Isometric  Stand or sit upright, on a firm surface. Place your right / left arm so that your palm faces your stomach, and it is at the height of your waist.  Place your opposite hand on the underside of your forearm. Gently push up as your right / left arm resists. Push as hard as you can with both arms, without causing any pain or movement at your right / left elbow. Hold this stationary position  for __________ seconds. Gradually release the tension in both arms. Allow your muscles to relax completely before repeating. Document Released: 09/05/2005 Document Revised: 01/20/2014 Document Reviewed: 12/18/2008 [facility] Patient Information 2015 [place], [place]. This information is not intended to replace advice given to you by your health care provider. Make sure you discuss any questions you have with your health care provider.

## 2014-09-23 DIAGNOSIS — L821 Other seborrheic keratosis: Secondary | ICD-10-CM | POA: Diagnosis not present

## 2014-09-23 DIAGNOSIS — L570 Actinic keratosis: Secondary | ICD-10-CM | POA: Diagnosis not present

## 2014-10-07 ENCOUNTER — Other Ambulatory Visit (INDEPENDENT_AMBULATORY_CARE_PROVIDER_SITE_OTHER): Payer: Medicare Other | Admitting: *Deleted

## 2014-10-07 DIAGNOSIS — E785 Hyperlipidemia, unspecified: Secondary | ICD-10-CM | POA: Diagnosis not present

## 2014-10-07 LAB — HEPATIC FUNCTION PANEL
ALBUMIN: 4.1 g/dL (ref 3.5–5.2)
ALT: 36 U/L (ref 0–53)
AST: 36 U/L (ref 0–37)
Alkaline Phosphatase: 59 U/L (ref 39–117)
Bilirubin, Direct: 0.1 mg/dL (ref 0.0–0.3)
TOTAL PROTEIN: 6.9 g/dL (ref 6.0–8.3)
Total Bilirubin: 0.4 mg/dL (ref 0.2–1.2)

## 2014-10-07 LAB — LDL CHOLESTEROL, DIRECT: Direct LDL: 101 mg/dL

## 2014-10-07 LAB — LIPID PANEL
Cholesterol: 159 mg/dL (ref 0–200)
HDL: 29.1 mg/dL — ABNORMAL LOW (ref >39.00)
NonHDL: 129.9
TRIGLYCERIDES: 263 mg/dL — AB (ref 0.0–149.0)
Total CHOL/HDL Ratio: 5
VLDL: 52.6 mg/dL — ABNORMAL HIGH (ref 0.0–40.0)

## 2014-10-07 LAB — CK: Total CK: 417 U/L — ABNORMAL HIGH (ref 7–232)

## 2014-10-08 ENCOUNTER — Encounter: Payer: Self-pay | Admitting: Internal Medicine

## 2014-10-08 ENCOUNTER — Ambulatory Visit (INDEPENDENT_AMBULATORY_CARE_PROVIDER_SITE_OTHER): Payer: Medicare Other | Admitting: Internal Medicine

## 2014-10-08 VITALS — BP 142/72 | HR 85 | Temp 97.2°F | Resp 12 | Wt 236.0 lb

## 2014-10-08 DIAGNOSIS — E1159 Type 2 diabetes mellitus with other circulatory complications: Secondary | ICD-10-CM

## 2014-10-08 DIAGNOSIS — I2510 Atherosclerotic heart disease of native coronary artery without angina pectoris: Secondary | ICD-10-CM

## 2014-10-08 DIAGNOSIS — E1151 Type 2 diabetes mellitus with diabetic peripheral angiopathy without gangrene: Secondary | ICD-10-CM

## 2014-10-08 LAB — HEMOGLOBIN A1C: HEMOGLOBIN A1C: 7.1 % — AB (ref 4.6–6.5)

## 2014-10-08 NOTE — Patient Instructions (Addendum)
Please change the pump settings as follows: - basal rates :  12 am: 1.15 >> 1.25   6 am: 1.300 >> 1.5 - ICR: 3, except 10:30 pm - 12 am: 5 >> 4 - ISF: 20 - target: 12 am: 120   6 am: 110 - IOB: 4h   Please stop at the lab.  Please come back for a follow-up appointment in 3 months.

## 2014-10-08 NOTE — Progress Notes (Signed)
Patient ID: Brent [name]., male   DOB: [birth year], 69 y.o.   MRN: [id number]  HPI: Brent [name]. is a 69 y.o.-year-old male, returning for f/u for DM2, dx 2003, insulin-dependent since 2011, uncontrolled, with complications (coronary artery disease, peripheral neuropathy). He was told he might have developed it from [place] (agent [place]). He gets his meds from the [place] (needs his Rx printed). Last visit 3 mo ago.  He had a URI >> coughing.  Last hemoglobin A1c was:  Lab Results  Component Value Date   HGBA1C 6.5 07/08/2014   HGBA1C 6.8* 04/07/2014   HGBA1C 6.2* 01/13/2014  - at the [facility]: 04/12/2013: HbA1C was 7.1%.  - Previously, HbA1c was 11%.   He started a T-slim pump on 08/28/2013. No CGM. DM educator: Nelia Shi.  Current regimen:  - Metformin 1000 mg po bid Pump Settings: - basal rates : TDD 30.3 units  12 am: 1.15 u/h   6 am: 1.300  - ICR: 3, except 10:30 pm - 12 am: 5 - ISF: 20 - target: 12 am: 120   6 am: 110 - IOB: 4h  He boluses for snacks too. Bolus tdd: ~45-60 units - changes infusion site q 3 days, no pb with the infusion site  Pt checks his sugars 3-4x a day:  - am: 116-209 >> 66 x1, 135-150 >> 112-136 >> 146-194 - after b'fast: 120-152 >> n/c >> 128 >> n/c - before lunch 102-204 >> 93-170 >> 92-130s >> 89-156 >> 130-140 >> 125-135 >> 120-150 >> 111-201 - after lunch: 140-220 >>not checking >> 156-188 >> 111-135 >> n/c >> n/c - before dinner: 83-205 (most 140-170) >> 76-151 >> 106-178 >> 142-150 >> 135-145 >> 121-195 - After dinner 145-268 >> not checking >> not checking >> 122-150 >> 99-188 >> 155-160 >> n/c - Bedtime 143-208 >> 92-263 >> 73-152 >> 91-215 >> 150s-160 >> 150-160 >> 95, 146-191 - nighttime: 40s x1, 56 x1 (in last 2 weeks) - when he takes too much insulin at bedtime! >> 60-70 sometimes >> had a 49!  Pt does not have chronic kidney disease, last BUN/creatinine was:  Lab Results  Component Value Date   BUN 19 07/08/2014   CREATININE 1.0 07/08/2014  On Lisinopril. Last set of lipids: Lab Results  Component Value Date   CHOL 159 10/07/2014   HDL 29.10* 10/07/2014   LDLCALC  02/14/2011    25        Total Cholesterol/HDL:CHD Risk Coronary Heart Disease Risk Table                     Men   Women  1/2 Average Risk   3.4   3.3  Average Risk       5.0   4.4  2 X Average Risk   9.6   7.1  3 X Average Risk  23.4   11.0        Use the calculated Patient Ratio above and the CHD Risk Table to determine the patient's CHD Risk.        ATP III CLASSIFICATION (LDL):  <100     mg/dL   Optimal  100-129  mg/dL   Near or Above                    Optimal  130-159  mg/dL   Borderline  160-189  mg/dL   High  >190     mg/dL   Very High   LDLDIRECT  101.0 10/07/2014   TRIG 263.0* 10/07/2014   CHOLHDL 5 10/07/2014   He was taken off statin by his cardiologist >> restarted statin >> CK increased again. Pt's last eye exam was in 01/15/2014 (no DR, reportedly) - Dr Delman Cheadle.  Has numbness and tingling in his legs - saw [facility].  Pt also has a history of HL, HTN, anxiety, kidney stones history of hydronephrosis, OSA-on CPAP, history of right distal radius fracture, colon polyps.  ROS: Constitutional: no weight gain, + fatigue, no subjective hyperthermia, no nocturia Eyes: no blurry vision, no xerophthalmia ENT: no sore throat, no nodules palpated in throat, no dysphagia/odynophagia, no hoarseness Cardiovascular: no CP/SOB/palpitations/leg swelling Respiratory: + cough/no SOB Gastrointestinal: no N/V/D/C Musculoskeletal: + muscle aches/no joint aches  Skin: no rashes, + easy bruising + difficulty with erections  I reviewed pt's medications, allergies, PMH, social hx, family hx and no changes required, except as mentioned above.  PE: BP 142/72 mmHg  Pulse 85  Temp(Src) 97.2 F (36.2 C) (Oral)  Resp 12  Wt 236 lb (107.049 kg)  SpO2 97% Wt Readings from Last 3 Encounters:  10/08/14 236 lb (107.049 kg)    09/22/14 240 lb (108.863 kg)  07/31/14 239 lb 12.8 oz (108.773 kg)   Constitutional: overweight, in NAD Eyes: PERRLA, EOMI, no exophthalmos ENT: moist mucous membranes, no thyromegaly, no cervical lymphadenopathy Cardiovascular: RRR, No MRG Respiratory: CTA B Gastrointestinal: abdomen soft, NT, ND, BS+ Musculoskeletal: no deformities, strength intact in all 4 Skin: moist, warm, no rashes Neurological: no tremor with outstretched hands, DTR normal in all 4  ASSESSMENT: 1. DM2, insulin-dependent, uncontrolled, with complications - coronary artery disease, history of MI - Peripheral neuropathy * on a T-slim pump  PLAN:  1. Patient with long-standing diabetes, with a much improved hemoglobin A1c after starting on insulin pump >> HbA1c 6.5 at last visit. His sugars are uniformly above target >> will need to increase his basal rates:  Patient Instructions  Please change the pump settings as follows: - basal rates :  12 am: 1.15 >> 1.25   6 am: 1.300 >> 1.5 - ICR: 3, except 10:30 pm - 12 am: 5 >> 4 - ISF: 20 - target: 12 am: 120   6 am: 110 - IOB: 4h   Please stop at the lab.  Please come back for a follow-up appointment in 3 months.  - check A1c today - he had the flu vaccine this season - I will see him back in 3 months   Office Visit on 10/08/2014  Component Date Value Ref Range Status  . Hgb A1c MFr Bld 10/08/2014 7.1* 4.6 - 6.5 % Final   Glycemic Control Guidelines for People with Diabetes:Non Diabetic:  <6%Goal of Therapy: <7%Additional Action Suggested:  >8%    HbA1c is higher, please see changes above.

## 2014-10-10 ENCOUNTER — Ambulatory Visit: Payer: Medicare Other | Admitting: Pharmacist

## 2014-10-16 ENCOUNTER — Ambulatory Visit (INDEPENDENT_AMBULATORY_CARE_PROVIDER_SITE_OTHER): Payer: Medicare Other | Admitting: Pharmacist

## 2014-10-16 DIAGNOSIS — E785 Hyperlipidemia, unspecified: Secondary | ICD-10-CM | POA: Diagnosis not present

## 2014-10-16 DIAGNOSIS — I2510 Atherosclerotic heart disease of native coronary artery without angina pectoris: Secondary | ICD-10-CM | POA: Diagnosis not present

## 2014-10-16 NOTE — Assessment & Plan Note (Signed)
Pt's CK did increase with the use of Livalo.  Fortunately he has not had any symptoms related to this increase.  Discussed with Dr. Angelena Form.  Increase was about 2x pt's baseline in December.  He does have a history of CAD and DM so needs to stay on statin if anyway possible.  Will plan on staying on statin since patient is asymptomatic and rechecking CK in 1 month.  If they continue to trend up, will have to stop statin and may consider PCS[facility]-9.  Pt asked about changing from Livalo back to Lipitor given both caused increase in CK and he has several bottles of Lipitor at home.  This will be fine as his LDL was very similar with the 2 agents.

## 2014-10-16 NOTE — Patient Instructions (Signed)
Go ahead and change from Livalo back to the Lipitor 40mg  you have at home.  We will check your CK level in a month.  If it continues to go up, we will stop the Lipitor and talk about other alternatives.

## 2014-10-16 NOTE — Progress Notes (Signed)
     History of Present Illness: 69 yo male with history of CAD, HLD, DM, OSA who was referred to the [facility] by Dr. Angelena Form.  Pt recently participated in the ACCELERATE trial (CTEP inhibitor) and was noted to have elevated CK.  He states he stopped the study drug mid 2015 due to weakness.  His first elevated CK was sometime around the end of September and he stopped Lipitor at that time.  Unfortunately those labs are not available.  His CK in October off Lipitor was still elevated at 425.  A repeat 6 weeks later showed that it had normalized at 198.  Given his risk factors, at his visit in December we discussed the need for retrying statin.  He was agreeable.  He was started on Livalo 2mg  daily.  Pt reports not have any change in muscle aches since starting Livalo.  He says he is always a little tired and some baseline discomfort but he takes care of his 15 year old grandson so thinks it may be related to this.   RF: CAD, DM, Age  Goal: LDL <70, non-HDL <100 Current Therapies: fenofibrate 160mg  daily, Livalo 2mg  daily Intolerances: Lipitor (increased CK), Crestor 20mg  [facility]  [facility]   Labs:  09/2013: TC 159, TG 263, HDL 29, LDL 101, LFTs normal, CK 417 (fenofibrate 160mg , Livalo 2mg ) 08/2014: CK 198  06/2014: TC 235, TG 338, HDL 31, LDL 154, LFTs normal, CK 425 (fenofibrate 160mg )   Current Outpatient Prescriptions  Medication Sig Dispense Refill  . aspirin 81 MG tablet Take 81 mg by mouth daily.    . fenofibrate 160 MG tablet Take 160 mg by mouth daily.    . fluticasone (FLONASE) 50 MCG/ACT nasal spray PLACE 2 SPRAYS INTO BOTH NOSTRILS DAILY. (Patient taking differently: PLACE 2 SPRAYS INTO BOTH NOSTRILS DAILY. as needed) 48 g 1  . glucagon (GLUCAGON EMERGENCY) 1 MG injection Inject 1 mg into the muscle once as needed. 1 each 12  . HYDROcodone-homatropine (HYCODAN) 5-1.5 MG/5ML syrup Take 5 mLs by mouth every 8 (eight) hours as needed  for cough. 120 mL 0  . insulin aspart (NOVOLOG) 100 UNIT/ML injection Inject 100 Units into the skin. Insulin pump gives 1 unit for every 15 carbs    . lisinopril (PRINIVIL,ZESTRIL) 5 MG tablet Take 5 mg by mouth daily.      Marland Kitchen LORazepam (ATIVAN) 1 MG tablet Take 1 tablet (1 mg total) by mouth 2 (two) times daily as needed for anxiety. 60 tablet 3  . metFORMIN (GLUCOPHAGE) 1000 MG tablet Take 1,000 mg by mouth 2 (two) times daily with a meal.    . Multiple Vitamins-Minerals (CENTRUM SILVER PO) 1 tab po qd     . Pitavastatin Calcium (LIVALO) 4 MG TABS Take 0.5 tablets (2 mg total) by mouth daily. 21 tablet 0  . sertraline (ZOLOFT) 50 MG tablet Take 100 mg by mouth daily.     . vitamin B-12 (CYANOCOBALAMIN) 1000 MCG tablet Take 1,000 mcg by mouth daily.      Marland Kitchen zolpidem (AMBIEN) 10 MG tablet Take 10 mg by mouth at bedtime as needed. For insomnia     No current facility-administered medications for this visit.    Allergies  Allergen Reactions  . Penicillins     REACTION: unspecified

## 2014-11-07 DIAGNOSIS — N401 Enlarged prostate with lower urinary tract symptoms: Secondary | ICD-10-CM | POA: Diagnosis not present

## 2014-11-07 DIAGNOSIS — Q6100 Congenital renal cyst, unspecified: Secondary | ICD-10-CM | POA: Diagnosis not present

## 2014-11-07 DIAGNOSIS — N200 Calculus of kidney: Secondary | ICD-10-CM | POA: Diagnosis not present

## 2014-11-18 ENCOUNTER — Other Ambulatory Visit (INDEPENDENT_AMBULATORY_CARE_PROVIDER_SITE_OTHER): Payer: Medicare Other | Admitting: *Deleted

## 2014-11-18 DIAGNOSIS — E785 Hyperlipidemia, unspecified: Secondary | ICD-10-CM | POA: Diagnosis not present

## 2014-11-18 LAB — CK: Total CK: 333 U/L — ABNORMAL HIGH (ref 7–232)

## 2014-11-18 NOTE — Addendum Note (Signed)
Addended by: Eulis Foster on: 11/18/2014 09:06 AM   Modules accepted: Orders

## 2014-11-20 ENCOUNTER — Encounter: Payer: Self-pay | Admitting: Pharmacist

## 2014-11-24 ENCOUNTER — Other Ambulatory Visit: Payer: Self-pay | Admitting: *Deleted

## 2014-11-24 MED ORDER — ROSUVASTATIN CALCIUM 20 MG PO TABS: 20.0000 mg | ORAL_TABLET | Freq: Every day | ORAL | Status: DC

## 2014-12-17 ENCOUNTER — Telehealth: Payer: Self-pay | Admitting: *Deleted

## 2014-12-17 NOTE — Telephone Encounter (Signed)
I called patient to let him know that he does not qualify for the [facility] since he participated in the Accelerate study which was an exclusion for [facility]. I left message with wife.

## 2014-12-18 NOTE — Telephone Encounter (Signed)
Thanks

## 2015-01-07 ENCOUNTER — Ambulatory Visit: Payer: Medicare Other | Admitting: Internal Medicine

## 2015-01-10 ENCOUNTER — Encounter (HOSPITAL_COMMUNITY): Payer: Self-pay | Admitting: Nurse Practitioner

## 2015-01-10 ENCOUNTER — Emergency Department (HOSPITAL_COMMUNITY)
Admission: EM | Admit: 2015-01-10 | Discharge: 2015-01-10 | Disposition: A | Discharge status: Home / Self Care | Payer: Medicare Other | Attending: Emergency Medicine | Admitting: Emergency Medicine

## 2015-01-10 ENCOUNTER — Emergency Department (HOSPITAL_COMMUNITY): Admission: EM | Admit: 2015-01-10 | Discharge: 2015-01-10 | Discharge status: Absent without leave | Payer: Self-pay

## 2015-01-10 DIAGNOSIS — I2510 Atherosclerotic heart disease of native coronary artery without angina pectoris: Secondary | ICD-10-CM | POA: Diagnosis not present

## 2015-01-10 DIAGNOSIS — E669 Obesity, unspecified: Secondary | ICD-10-CM | POA: Diagnosis not present

## 2015-01-10 DIAGNOSIS — Z87448 Personal history of other diseases of urinary system: Secondary | ICD-10-CM | POA: Diagnosis not present

## 2015-01-10 DIAGNOSIS — Z7982 Long term (current) use of aspirin: Secondary | ICD-10-CM | POA: Insufficient documentation

## 2015-01-10 DIAGNOSIS — I252 Old myocardial infarction: Secondary | ICD-10-CM | POA: Diagnosis not present

## 2015-01-10 DIAGNOSIS — Z86010 Personal history of colonic polyps: Secondary | ICD-10-CM | POA: Insufficient documentation

## 2015-01-10 DIAGNOSIS — E119 Type 2 diabetes mellitus without complications: Secondary | ICD-10-CM | POA: Insufficient documentation

## 2015-01-10 DIAGNOSIS — Z87442 Personal history of urinary calculi: Secondary | ICD-10-CM | POA: Insufficient documentation

## 2015-01-10 DIAGNOSIS — Z880 Allergy status to penicillin: Secondary | ICD-10-CM | POA: Insufficient documentation

## 2015-01-10 DIAGNOSIS — Z7951 Long term (current) use of inhaled steroids: Secondary | ICD-10-CM | POA: Insufficient documentation

## 2015-01-10 DIAGNOSIS — Z794 Long term (current) use of insulin: Secondary | ICD-10-CM | POA: Diagnosis not present

## 2015-01-10 DIAGNOSIS — Z9861 Coronary angioplasty status: Secondary | ICD-10-CM | POA: Diagnosis not present

## 2015-01-10 DIAGNOSIS — Z8669 Personal history of other diseases of the nervous system and sense organs: Secondary | ICD-10-CM | POA: Diagnosis not present

## 2015-01-10 DIAGNOSIS — K1121 Acute sialoadenitis: Secondary | ICD-10-CM | POA: Diagnosis not present

## 2015-01-10 DIAGNOSIS — E785 Hyperlipidemia, unspecified: Secondary | ICD-10-CM | POA: Diagnosis not present

## 2015-01-10 DIAGNOSIS — I10 Essential (primary) hypertension: Secondary | ICD-10-CM | POA: Diagnosis not present

## 2015-01-10 DIAGNOSIS — Z87891 Personal history of nicotine dependence: Secondary | ICD-10-CM | POA: Diagnosis not present

## 2015-01-10 DIAGNOSIS — R42 Dizziness and giddiness: Secondary | ICD-10-CM | POA: Diagnosis present

## 2015-01-10 LAB — CBG MONITORING, ED: Glucose-Capillary: 108 mg/dL — ABNORMAL HIGH (ref 70–99)

## 2015-01-10 LAB — BASIC METABOLIC PANEL
ANION GAP: 13 (ref 5–15)
BUN: 14 mg/dL (ref 6–23)
CHLORIDE: 105 mmol/L (ref 96–112)
CO2: 20 mmol/L (ref 19–32)
Calcium: 9.2 mg/dL (ref 8.4–10.5)
Creatinine, Ser: 1.03 mg/dL (ref 0.50–1.35)
GFR calc Af Amer: 83 mL/min — ABNORMAL LOW (ref >90)
GFR calc non Af Amer: 72 mL/min — ABNORMAL LOW (ref >90)
Glucose, Bld: 149 mg/dL — ABNORMAL HIGH (ref 70–99)
Potassium: 3.9 mmol/L (ref 3.5–5.1)
SODIUM: 138 mmol/L (ref 135–145)

## 2015-01-10 LAB — CBC
HEMATOCRIT: 41.4 % (ref 39.0–52.0)
Hemoglobin: 13.9 g/dL (ref 13.0–17.0)
MCH: 30.1 pg (ref 26.0–34.0)
MCHC: 33.6 g/dL (ref 30.0–36.0)
MCV: 89.6 fL (ref 78.0–100.0)
Platelets: 232 10*3/uL (ref 150–400)
RBC: 4.62 MIL/uL (ref 4.22–5.81)
RDW: 13 % (ref 11.5–15.5)
WBC: 7.3 10*3/uL (ref 4.0–10.5)

## 2015-01-10 LAB — I-STAT TROPONIN, ED: TROPONIN I, POC: 0 ng/mL (ref 0.00–0.08)

## 2015-01-10 MED ORDER — DIAZEPAM 5 MG PO TABS: 5.0000 mg | ORAL_TABLET | Freq: Two times a day (BID) | ORAL | Status: DC

## 2015-01-10 MED ORDER — MECLIZINE HCL 25 MG PO TABS
25.0000 mg | ORAL_TABLET | Freq: Once | ORAL | Status: AC
  Administered 2015-01-10: 25 mg via ORAL
  Filled 2015-01-10: qty 1

## 2015-01-10 NOTE — ED Notes (Signed)
He c/o dizziness since yesterday. Hes had pain in his anterior neck and jaw and states "it feels like my glands are swollen." he denie sSOB, CP, sore throat, cough, congestion. He is A&Ox4, resp e/u

## 2015-01-10 NOTE — ED Provider Notes (Signed)
CSN: [id number]     Arrival date & time 01/10/15  48 History   First MD Initiated Contact with Patient 01/10/15 1905     Chief Complaint  Patient presents with  . Dizziness     (Consider location/radiation/quality/duration/timing/severity/associated sxs/prior Treatment) HPI Comments: Patient presents emergency department with chief complaint of dizziness. He states symptoms started yesterday. He also complains of pain in his cheeks and jaw and anterior neck. States that "it feels like my glands are swollen." Patient denies any fevers, chills, chest pain, shortness of breath. States that he has had a mild sore throat and cough. Denies any sick contacts. The swelling in his cheeks are tender to palpation.  The history is provided by the patient. No language interpreter was used.    Past Medical History  Diagnosis Date  . CAD (coronary artery disease)   . Myocardial infarction   . HTN (hypertension)   . Hyperlipidemia   . Obesity   . Diabetes mellitus   . Fatigue   . OSA (obstructive sleep apnea)   . Persistent disorder of initiating or maintaining sleep   . Erectile dysfunction   . Nephrolithiasis   . Diverticulosis   . Hemorrhoids   . Personal history of colonic polyps 02/05/2003  . Kidney stones    Past Surgical History  Procedure Laterality Date  . Coronary stent placement      stenting of the right coronary artery with followup rotational  atherectomy.   . Cholecystectomy    . Coronary angioplasty    . Orthopedic surgery    . Rotator cuff surg      Bil  . Inguinal hernia repair      right  . Foot surgery      right  . Finger surgery      right   Family History  Problem Relation Age of Onset  . Liver cancer Father   . Lung cancer Father   . Cancer Father   . Heart disease Father   . Heart disease Sister   . Lung cancer Mother   . COPD Mother   . Cancer Mother   . Obesity Mother    History  Substance Use Topics  . Smoking status: Former Smoker -- 1.50  packs/day for 30 years    Types: Cigarettes    Quit date: 09/19/1980  . Smokeless tobacco: Never Used  . Alcohol Use: 0.0 oz/week     Comment: social    Review of Systems  Constitutional: Negative for fever and chills.  HENT:       Cheek swelling  Respiratory: Negative for shortness of breath.   Cardiovascular: Negative for chest pain.  Gastrointestinal: Negative for nausea, vomiting, diarrhea and constipation.  Genitourinary: Negative for dysuria.  All other systems reviewed and are negative.     Allergies  Penicillins  Home Medications   Prior to Admission medications   Medication Sig Start Date End Date Taking? Authorizing Provider  aspirin 81 MG tablet Take 81 mg by mouth daily.   Yes Historical Provider, MD  fenofibrate 160 MG tablet Take 160 mg by mouth daily.   Yes Historical Provider, MD  fluticasone (FLONASE) 50 MCG/ACT nasal spray PLACE 2 SPRAYS INTO BOTH NOSTRILS DAILY. Patient taking differently: PLACE 2 SPRAYS INTO BOTH NOSTRILS DAILY AS NEEDED FOR CONGESTION 07/02/14  Yes Kathee Delton, MD  Insulin Human (INSULIN PUMP) SOLN Inject 1 each into the skin continuous. Novolog 1 unit for every 15 carbs   Yes Historical Provider, MD  lisinopril (PRINIVIL,ZESTRIL) 5 MG tablet Take 5 mg by mouth daily.     Yes Historical Provider, MD  LORazepam (ATIVAN) 1 MG tablet Take 1 tablet (1 mg total) by mouth 2 (two) times daily as needed for anxiety. Patient taking differently: Take 1 mg by mouth at bedtime.  09/22/14  Yes Marletta Lor, MD  metFORMIN (GLUCOPHAGE) 1000 MG tablet Take 1,000 mg by mouth 2 (two) times daily with a meal.   Yes Historical Provider, MD  Multiple Vitamins-Minerals (CENTRUM SILVER PO) 1 tab po qd    Yes Historical Provider, MD  rosuvastatin (CRESTOR) 20 MG tablet Take 1 tablet (20 mg total) by mouth daily. 11/24/14  Yes Burnell Blanks, MD  sertraline (ZOLOFT) 100 MG tablet Take 100 mg by mouth daily. Total of 125mg  daily   Yes Historical  Provider, MD  sertraline (ZOLOFT) 50 MG tablet Take 25 mg by mouth daily. Total of 125mg  daily 09/12/14  Yes Historical Provider, MD  vitamin B-12 (CYANOCOBALAMIN) 1000 MCG tablet Take 1,000 mcg by mouth daily.     Yes Historical Provider, MD  zolpidem (AMBIEN) 10 MG tablet Take 10 mg by mouth at bedtime as needed. For insomnia 04/24/12  Yes Bruce Kendall Flack, MD  glucagon (GLUCAGON EMERGENCY) 1 MG injection Inject 1 mg into the muscle once as needed. 04/07/14   Philemon Kingdom, MD  HYDROcodone-homatropine [facility]) 5-1.5 MG/5ML syrup Take 5 mLs by mouth every 8 (eight) hours as needed for cough. 09/09/14   Marletta Lor, MD   BP 153/66 mmHg  Pulse 86  Temp(Src) 98.5 F (36.9 C)  Resp 18  SpO2 97% Physical Exam  Constitutional: He is oriented to person, place, and time. He appears well-developed and well-nourished.  HENT:  Head: Normocephalic and atraumatic.  Parotid gland swelling with tenderness to palpation, but no abscess, or erythema  Eyes: Conjunctivae and EOM are normal. Pupils are equal, round, and reactive to light. Right eye exhibits no discharge. Left eye exhibits no discharge. No scleral icterus.  Neck: Normal range of motion. Neck supple. No JVD present.  Cardiovascular: Normal rate, regular rhythm and normal heart sounds.  Exam reveals no gallop and no friction rub.   No murmur heard. Pulmonary/Chest: Effort normal and breath sounds normal. No respiratory distress. He has no wheezes. He has no rales. He exhibits no tenderness.  Abdominal: Soft. He exhibits no distension and no mass. There is no tenderness. There is no rebound and no guarding.  Musculoskeletal: Normal range of motion. He exhibits no edema or tenderness.  Neurological: He is alert and oriented to person, place, and time.  Skin: Skin is warm and dry.  Psychiatric: He has a normal mood and affect. His behavior is normal. Judgment and thought content normal.  Nursing note and vitals reviewed.   ED Course   Procedures (including critical care time) Labs Review Labs Reviewed  BASIC METABOLIC PANEL - Abnormal; Notable for the following:    Glucose, Bld 149 (*)    GFR calc non Af Amer 72 (*)    GFR calc Af Amer 83 (*)    All other components within normal limits  CBC  I-STAT TROPOININ, ED    Imaging Review No results found.   EKG Interpretation   Date/Time:  Saturday January 10 2015 17:02:49 EDT Ventricular Rate:  86 PR Interval:  160 QRS Duration: 96 QT Interval:  382 QTC Calculation: 457 R Axis:   72 Text Interpretation:  Normal sinus rhythm Cannot rule out Anterior infarct  ,  age undetermined No significant change since last tracing Confirmed by  [facility]  MD, Loree Fee ([id number]) on 01/10/2015 7:04:31 PM      MDM   Final diagnoses:  Parotitis, acute    Patient with bilateral parotid gland swelling with tenderness to palpation, no evidence of abscess. Suspect parotitis, also with vertigo symptoms. We'll check basic labs, EKG. Will clean. Check orthostatic vital signs. Will reassess.    Ambulates to bathroom without difficulty.  Patient seen by and discussed with Dr. Maryan Rued, who agrees with workup and treatment plan.  Symptomatic treatment.  Isolation precautions.  Valium for vertigo.     Montine Circle, PA-C 01/10/15 2116  Blanchie Dessert, MD 01/11/15 [phone]

## 2015-01-10 NOTE — ED Notes (Signed)
Pt verbalized understanding of d/c instructions and has no further questions.  

## 2015-01-10 NOTE — Discharge Instructions (Signed)
Parotitis Parotitis is soreness and inflammation of one or both parotid glands. The parotid glands produce saliva. They are located on each side of the face, below and in front of the earlobes. The saliva produced comes out of tiny openings (ducts) inside the cheeks. In most cases, parotitis goes away over time or with treatment. If your parotitis is caused by certain long-term (chronic) diseases, it may come back again.  CAUSES  Parotitis can be caused by:  Viral infections. Mumps is one viral infection that can cause parotitis.  Bacterial infections.  Blockage of the salivary ducts due to a salivary stone.  Narrowing of the salivary ducts.  Swelling of the salivary ducts.  Dehydration.  Autoimmune conditions, such as sarcoidosis or Sjogren syndrome.  Air from activities such as scuba diving, glass blowing, or playing an instrument (rare).  Human immunodeficiency virus (HIV) or acquired immunodeficiency syndrome (AIDS).  Tuberculosis. SIGNS AND SYMPTOMS   The ears may appear to be pushed up and out from their normal position.  Redness (erythema) of the skin over the parotid glands.  Pain and tenderness over the parotid glands.  Swelling in the parotid gland area.  Yellowish-white fluid (pus) coming from the ducts inside the cheeks.  Dry mouth.  Bad taste in the mouth. DIAGNOSIS  Your health care provider may determine that you have parotitis based on your symptoms and a physical exam. A sample of fluid may also be taken from the parotid gland and tested to find the cause of your infection. X-rays or computed tomography (CT) scans may be taken if your health care provider thinks you might have a salivary stone blocking your salivary duct. TREATMENT  Treatment varies depending upon the cause of your parotitis. If your parotitis is caused by mumps, no treatment is needed. The condition will go away on its own after 7 to 10 days. In other cases, treatment may  include:  Antibiotic medicine if your infection was caused by bacteria.  Pain medicines.  Gland massage.  Eating sour candy to increase your saliva production.  Removal of salivary stones. Your health care provider may flush stones out with fluids or remove them with tweezers.  Surgery to remove the parotid glands. HOME CARE INSTRUCTIONS   If you were prescribed an antibiotic medicine, finish it all even if you start to feel better.  Put warm compresses on the sore area.  Take medicines only as directed by your health care provider.  Drink enough fluids to keep your urine clear or pale yellow. SEEK IMMEDIATE MEDICAL CARE IF:   You have increasing pain or swelling that is not controlled with medicine.  You have a fever. MAKE SURE YOU:  Understand these instructions.  Will watch your condition.  Will get help right away if you are not doing well or get worse. Document Released: 02/25/2002 Document Revised: 01/20/2014 Document Reviewed: 08/01/2011 [facility] Patient Information 2015 [place], [place]. This information is not intended to replace advice given to you by your health care provider. Make sure you discuss any questions you have with your health care provider.  Mumps Mumps is an infection caused by a type of germ (virus). Mumps is usually seen in children between the ages of 53 to 37 years of age, but it can occur in older teenagers and adults. Mumps is common worldwide. Vaccination in one country may not protect you against the mumps virus in a different country. Your caregiver may have specific recommendations. CAUSES  The mumps is caused by direct contact  with an infected person. The person you get the mumps from may not have had any symptoms at the time that you or your child came in contact with them. That is because the length of time between being exposed to an illness and when symptoms occur (incubation period) ranges from 2 to 3 weeks. SYMPTOMS   Painful  enlargement of the salivary glands, especially the parotid gland. The parotid gland is a large gland that lies just behind the upper jaw. The swelling usually occurs over several days and often begins on one side of the face. The swelling goes away in about 1 week.  Muscular aches and pains.  Fever.  Headache.  Abdominal pain.  Loss of appetite.  General tiredness (malaise).  Males (usually over the age of 40 years) may have severe pain of their testicles on one or both sides. DIAGNOSIS  A caregiver will usually perform a physical exam. Blood tests can help confirm the diagnosis when necessary. A caregiver will decide if any additional blood tests are necessary to look for rare complications of the illness. Generally, viral cultures and lab tests are not needed.  TREATMENT Treatment is symptomatic. This means that treatment can only help improve symptoms. There is no medication to treat Mumps. HOME CARE INSTRUCTIONS   A caregiver may recommend immunizations if there is a mumps outbreak.  Keep the infected person away from others, especially those who have not had their full course of vaccines or are pregnant.  School or daycare should be avoided for 9 days from the onset of swollen glands or as directed by a caregiver.  Wash your hand well at home. This will help prevent the spread of the virus.  Get plenty of rest.  Drink enough fluids to keep your urine clear or pale yellow.  A soft diet is helpful for jaw pain.  Avoid food and fluids that are acidic as they will upset the stomach and worsen mouth pain. Examples include:  Orange juice.  Tomatoes.  Products containing vinegar.  Only take over-the-counter or prescription medicines for pain, discomfort, or fever as directed by your caregiver. Do not give aspirin to children. SEEK MEDICAL CARE IF:   You or your child has an oral temperature above 102 F (38.9 C).  A severe headache develops.  You or your child has  weakness.  You or your child becomes confused.  You or your child keeps throwing up (vomiting).  Ringing in the ears develops.  You or your child has neck pain or stiff neck.  There is pain in the testicles. MAKE SURE YOU:   Understand these instructions.  Will watch your condition.  Will get help right away if you are not doing well or get worse. Document Released: 09/05/2005 Document Revised: 11/28/2011 Document Reviewed: 04/23/2009 [facility] Patient Information 2015 [place], [place]. This information is not intended to replace advice given to you by your health care provider. Make sure you discuss any questions you have with your health care provider.

## 2015-01-10 NOTE — ED Notes (Signed)
Dr Plunkett in room 

## 2015-01-28 ENCOUNTER — Ambulatory Visit (INDEPENDENT_AMBULATORY_CARE_PROVIDER_SITE_OTHER): Payer: Medicare Other | Admitting: Cardiovascular Disease

## 2015-01-28 ENCOUNTER — Encounter: Payer: Self-pay | Admitting: Cardiovascular Disease

## 2015-01-28 VITALS — BP 112/66 | HR 77 | Ht 72.0 in | Wt 236.2 lb

## 2015-01-28 DIAGNOSIS — I10 Essential (primary) hypertension: Secondary | ICD-10-CM | POA: Diagnosis not present

## 2015-01-28 DIAGNOSIS — I2510 Atherosclerotic heart disease of native coronary artery without angina pectoris: Secondary | ICD-10-CM | POA: Diagnosis not present

## 2015-01-28 DIAGNOSIS — E785 Hyperlipidemia, unspecified: Secondary | ICD-10-CM

## 2015-01-28 NOTE — Patient Instructions (Signed)
Medication Instructions:  Your physician recommends that you continue on your current medications as directed. Please refer to the Current Medication list given to you today.   Labwork: none  Testing/Procedures: none  Follow-Up: Your physician wants you to follow-up in: 6 months.  You will receive a reminder letter in the mail two months in advance. If you don't receive a letter, please call our office to schedule the follow-up appointment.       

## 2015-01-28 NOTE — Progress Notes (Signed)
History of Present Illness: 69 yo male with history of CAD, HLD, DM, OSA who is here today for cardiac follow up. He has been followed in the past by Dr. Lia Foyer. He had angioplasty of his RCA in 1990. He had a bare metal stent placed in the RCA in 2000 followed by rotational atherectomy shortly after for stent restenosis. Last cath in May 2012 per Dr. Lia Foyer with stable moderate diffuse CAD. (70% mid LAD, 80% diagonal, 70% Ramus, 40% mid to distal RCA stent restenosis). He has done well with medical management. He is seeing an endocrinologist for his DM. He is on CPAP and see Dr. Gwenette Greet. He has had elevated CK on statins. He has been seen in the lipid clinic and was tried on Livalo which also caused elevated CK. He was placed back on Crestor. Stress myoview January 2015 with no ischemia.   He is here today for follow up. He is feeling well. No chest pain or SOB. Recent dizziness and swelling in neck and seen in the ED. He is very active.   Primary Care Physician: Phoebe Sharps  Last Lipid Profile:Lipid Panel     Component Value Date/Time   CHOL 159 10/07/2014 0854   TRIG 263.0* 10/07/2014 0854   HDL 29.10* 10/07/2014 0854   CHOLHDL 5 10/07/2014 0854   VLDL 52.6* 10/07/2014 0854   LDLCALC  02/14/2011 0147    25        Total Cholesterol/HDL:CHD Risk Coronary Heart Disease Risk Table                     Men   Women  1/2 Average Risk   3.4   3.3  Average Risk       5.0   4.4  2 X Average Risk   9.6   7.1  3 X Average Risk  23.4   11.0        Use the calculated Patient Ratio above and the CHD Risk Table to determine the patient's CHD Risk.        ATP III CLASSIFICATION (LDL):  <100     mg/dL   Optimal  100-129  mg/dL   Near or Above                    Optimal  130-159  mg/dL   Borderline  160-189  mg/dL   High  >190     mg/dL   Very High     Past Medical History  Diagnosis Date  . CAD (coronary artery disease)   . Myocardial infarction   . HTN (hypertension)   .  Hyperlipidemia   . Obesity   . Diabetes mellitus   . Fatigue   . OSA (obstructive sleep apnea)   . Persistent disorder of initiating or maintaining sleep   . Erectile dysfunction   . Nephrolithiasis   . Diverticulosis   . Hemorrhoids   . Personal history of colonic polyps 02/05/2003  . Kidney stones     Past Surgical History  Procedure Laterality Date  . Coronary stent placement      stenting of the right coronary artery with followup rotational  atherectomy.   . Cholecystectomy    . Coronary angioplasty    . Orthopedic surgery    . Rotator cuff surg      Bil  . Inguinal hernia repair      right  . Foot surgery      right  .  Finger surgery      right    Current Outpatient Prescriptions  Medication Sig Dispense Refill  . HYDROcodone-homatropine (HYCODAN) 5-1.5 MG/5ML syrup Take 5 mLs by mouth every 8 (eight) hours as needed for cough (for cough).    Marland Kitchen aspirin 81 MG tablet Take 81 mg by mouth daily.    . diazepam (VALIUM) 5 MG tablet Take 1 tablet (5 mg total) by mouth 2 (two) times daily. 5 tablet 0  . fenofibrate 160 MG tablet Take 160 mg by mouth daily.    Marland Kitchen glucagon (GLUCAGON EMERGENCY) 1 MG injection Inject 1 mg into the muscle once as needed. 1 each 12  . HYDROcodone-homatropine (HYCODAN) 5-1.5 MG/5ML syrup Take 5 mLs by mouth every 8 (eight) hours as needed for cough. 120 mL 0  . Insulin Human (INSULIN PUMP) SOLN Inject 1 each into the skin continuous. Novolog 1 unit for every 15 carbs    . lisinopril (PRINIVIL,ZESTRIL) 5 MG tablet Take 5 mg by mouth daily.      Marland Kitchen LORazepam (ATIVAN) 1 MG tablet Take 1 tablet (1 mg total) by mouth 2 (two) times daily as needed for anxiety. (Patient taking differently: Take 1 mg by mouth at bedtime. ) 60 tablet 3  . metFORMIN (GLUCOPHAGE) 1000 MG tablet Take 1,000 mg by mouth 2 (two) times daily with a meal.    . Multiple Vitamins-Minerals (CENTRUM SILVER PO) 1 tab po qd     . rosuvastatin (CRESTOR) 20 MG tablet Take 1 tablet (20 mg  total) by mouth daily. 90 tablet 3  . sertraline (ZOLOFT) 100 MG tablet Take 100 mg by mouth daily. Total of 125mg  daily    . sertraline (ZOLOFT) 50 MG tablet Take 25 mg by mouth daily. Total of 125mg  daily    . vitamin B-12 (CYANOCOBALAMIN) 1000 MCG tablet Take 1,000 mcg by mouth daily.      Marland Kitchen zolpidem (AMBIEN) 10 MG tablet Take 10 mg by mouth at bedtime as needed. For insomnia     No current facility-administered medications for this visit.    Allergies  Allergen Reactions  . Penicillins Other (See Comments)    REACTION: unspecified    History   Social History  . Marital Status: Married    Spouse Name: N/A  . Number of Children: N/A  . Years of Education: N/A   Occupational History  . Not on file.   Social History Main Topics  . Smoking status: Former Smoker -- 1.50 packs/day for 30 years    Types: Cigarettes    Quit date: 09/19/1980  . Smokeless tobacco: Never Used  . Alcohol Use: 0.0 oz/week     Comment: social  . Drug Use: No  . Sexual Activity: Not on file   Other Topics Concern  . Not on file   Social History Narrative    Family History  Problem Relation Age of Onset  . Liver cancer Father   . Lung cancer Father   . Cancer Father   . Heart disease Father   . Heart disease Sister   . Lung cancer Mother   . COPD Mother   . Cancer Mother   . Obesity Mother     Review of Systems:  As stated in the HPI and otherwise negative.   BP 112/66 mmHg  Pulse 77  Ht 6' (1.829 m)  Wt 236 lb 3.2 oz (107.14 kg)  BMI 32.03 kg/m2  Physical Examination: General: Well developed, well nourished, NAD HEENT: OP clear, mucus membranes  moist SKIN: warm, dry. No rashes. Neuro: No focal deficits Musculoskeletal: Muscle strength 5/5 all ext Psychiatric: Mood and affect normal Neck: No JVD, no carotid bruits, no thyromegaly, no lymphadenopathy. Lungs:Clear bilaterally, no wheezes, rhonci, crackles Cardiovascular: Regular rate and rhythm. No murmurs, gallops or  rubs. Abdomen:Soft. Bowel sounds present. Non-tender.  Extremities: No lower extremity edema. Pulses are 2 + in the bilateral DP/PT.  Stress myoview 09/25/13: Rest Procedure: Myocardial perfusion imaging was performed at rest 45 minutes following the intravenous administration of Technetium 30m Sestamibi.  Rest ECG: NSR nonspecific ST/T wave changes  Stress Procedure: The patient exercised on the treadmill utilizing the Bruce Protocol for 8:30 minutes. The patient stopped due to SOB and denied any chest pain. Technetium 58m Sestamibi was injected at peak exercise and myocardial perfusion imaging was performed after a brief delay.  Stress ECG: Significant ST abnormalities consistent with ischemia.  QPS  Raw Data Images: Patient motion noted.  Stress Images: Normal homogeneous uptake in all areas of the myocardium.  Rest Images: Normal homogeneous uptake in all areas of the myocardium.  Subtraction (SDS): Normal  Transient Ischemic Dilatation (Normal <1.22): 0.97  Lung/Heart Ratio (Normal <0.45): 0.24  Quantitative Gated Spect Images  QGS EDV: 115 ml  QGS ESV: 55 ml  Impression  Exercise Capacity: Fair exercise capacity.  BP Response: Hypertensive blood pressure response.  Clinical Symptoms: There is dyspnea.  ECG Impression: Significant ST abnormalities consistent with ischemia.  Comparison with Prior Nuclear Study: No images to compare  Overall Impression: Low risk stress nuclear study No perfusion defects. Hypertensive response to exercise. Baseline ECG with nonspecific ST/T wave changes but 6mm ST segment depression in inferior and lateral leads with stress.  LV Ejection Fraction: 52%. LV Wall Motion: NL LV Function; NL Wall Motion  Cardiac cath 02/06/11: 1. Ventriculography done in the RAO projection reveals preserved  global systolic function. No definite segmental wall motion  abnormalities are identified. There may be trace mitral  regurgitation, but none of hemodynamic  significance.  2. The left main is slightly tapered, but without significant focal  narrowing.  3. The LAD is fairly heavily calcified. There is an eccentric plaque  of approximately 75% noted in the midvessel. This is just after  the septal and overlying the takeoff of the small first diagonal.  The small first diagonal has about 80% narrowing, unchanged from  the previous study. The mid and distal LAD has about 50%  narrowing, is calcified, but is largely unchanged from prior  studies and the vessel wraps the apex.  4. The circumflex is a large-caliber vessel. There is a ramus  intermedius that has 70-75% proximal segmental plaque. The vessel  beyond this is widely patent. When compared carefully to the  previous study, there does not appear to be a substantial change.  The AV circumflex is a large-caliber vessel without significant  focal narrowing. It is also calcified.  5. The right coronary artery has been previously stented. There is a  long stent in the mid vessel extending down to the distal vessel  overlapping the PDA takeoff, and extending into the posterolateral  segment across the first PLL-1. The stent has been previously  opened, then subsequently rotobladed. The vessel remains widely  patent with about 40% diffuse in-stent re-narrowing, but without  significant focal narrowing.  CONCLUSION:  1. Well-preserved left ventricular systolic function.  2. Continued 70-75% eccentric stenosis of the left anterior descending  artery, largely unchanged from 2003.  3. 70-75% proximal ramus intermedius  stenosis, again appears unchanged  from 2003.  4. Prior extensive stenting of the right coronary artery with  subsequent treatment with rotational atherectomy with continued  patency.  EKG:  EKG is not ordered today. The ekg ordered today demonstrates   Recent Labs: 10/07/2014: ALT 36 01/10/2015: BUN 14; Creatinine 1.03; Hemoglobin 13.9; Platelets 232; Potassium 3.9; Sodium 138    Lipid Panel    Component Value Date/Time   CHOL 159 10/07/2014 0854   TRIG 263.0* 10/07/2014 0854   HDL 29.10* 10/07/2014 0854   CHOLHDL 5 10/07/2014 0854   VLDL 52.6* 10/07/2014 0854   LDLCALC  02/14/2011 0147    25        Total Cholesterol/HDL:CHD Risk Coronary Heart Disease Risk Table                     Men   Women  1/2 Average Risk   3.4   3.3  Average Risk       5.0   4.4  2 X Average Risk   9.6   7.1  3 X Average Risk  23.4   11.0        Use the calculated Patient Ratio above and the CHD Risk Table to determine the patient's CHD Risk.        ATP III CLASSIFICATION (LDL):  <100     mg/dL   Optimal  100-129  mg/dL   Near or Above                    Optimal  130-159  mg/dL   Borderline  160-189  mg/dL   High  >190     mg/dL   Very High   LDLDIRECT 101.0 10/07/2014 0854     Wt Readings from Last 3 Encounters:  01/28/15 236 lb 3.2 oz (107.14 kg)  10/08/14 236 lb (107.049 kg)  09/22/14 240 lb (108.863 kg)     Other studies Reviewed: Additional studies/ records that were reviewed today include: . Review of the above records demonstrates:    Assessment and Plan:   1. CAD: Known moderate CAD. No recent chest pains. Stress myoview January 2015 with no ischemia. Continue ASA, statin, Ace-inh.      2. HYPERLIPIDEMIA: Now on Crestor. Known to have elevated CK on statins.      3. HTN: BP controlled. No changes  4. DM: Per primary care    Current medicines are reviewed at length with the patient today.  The patient does not have concerns regarding medicines.  The following changes have been made:  no change  Labs/ tests ordered today include:  No orders of the defined types were placed in this encounter.    Disposition:   FU with me in 6 months  Signed, Lauree Chandler, MD 01/28/2015 2:29 PM    [address], [place], [place]  [address] Phone: [phone]; Fax: [phone]

## 2015-01-29 DIAGNOSIS — H2513 Age-related nuclear cataract, bilateral: Secondary | ICD-10-CM | POA: Diagnosis not present

## 2015-01-29 DIAGNOSIS — E119 Type 2 diabetes mellitus without complications: Secondary | ICD-10-CM | POA: Diagnosis not present

## 2015-02-04 ENCOUNTER — Ambulatory Visit: Payer: Medicare Other | Admitting: Internal Medicine

## 2015-02-05 ENCOUNTER — Encounter: Payer: Self-pay | Admitting: Internal Medicine

## 2015-02-05 ENCOUNTER — Ambulatory Visit (INDEPENDENT_AMBULATORY_CARE_PROVIDER_SITE_OTHER): Payer: Medicare Other | Admitting: Internal Medicine

## 2015-02-05 ENCOUNTER — Telehealth: Payer: Self-pay | Admitting: Internal Medicine

## 2015-02-05 VITALS — BP 132/84 | HR 91 | Temp 97.9°F | Resp 12 | Wt 239.0 lb

## 2015-02-05 DIAGNOSIS — I2510 Atherosclerotic heart disease of native coronary artery without angina pectoris: Secondary | ICD-10-CM

## 2015-02-05 DIAGNOSIS — E1159 Type 2 diabetes mellitus with other circulatory complications: Secondary | ICD-10-CM | POA: Diagnosis not present

## 2015-02-05 DIAGNOSIS — E1151 Type 2 diabetes mellitus with diabetic peripheral angiopathy without gangrene: Secondary | ICD-10-CM

## 2015-02-05 NOTE — Telephone Encounter (Signed)
Pt's pump is not working. Dr Cruzita Lederer will advise at appt.

## 2015-02-05 NOTE — Telephone Encounter (Signed)
Patient would like to talk to before his appt,

## 2015-02-05 NOTE — Progress Notes (Signed)
Patient ID: Brent Duchesne., male   DOB: [birth year], 69 y.o.   MRN: [id number]  HPI: Brent Spalla. is a 69 y.o.-year-old male, returning for f/u for DM2, dx 2003, insulin-dependent since 2011, uncontrolled, with complications (coronary artery disease, peripheral neuropathy). He was told he might have developed it from [place] (agent [place]). He gets his meds from the [facility] (needs his Rx printed). Last visit 4 mo ago.  He had parotiditis (mumps) on 01/10/2015 >> ED.  Sugars improved after last visit >> last HbA1c was 6.2% 3 mo after last visit, at [facility]. However, they worsened recently >> feels insulin not getting in and he had problems with his pump >> will get a new one tomorrow.  Last hemoglobin A1c was:  12/2014: HbA1c 6.2% Lab Results  Component Value Date   HGBA1C 7.1* 10/08/2014   HGBA1C 6.5 07/08/2014   HGBA1C 6.8* 04/07/2014  - at the [facility]: 04/12/2013: HbA1C was 7.1%.  - Previously, HbA1c was 11%.   He is on a T-slim pump on 08/28/2013. No CGM. DM educator: Nelia Shi.  Current regimen:  - Metformin 1000 mg po bid Pump Settings: - basal rates :  12 am: 1.15 >> 1.25   6 am: 1.300 >> 1.5 - ICR: 3, except 10:30 pm - 12 am: 5 >> 4 - ISF: 20 - target: 12 am: 120   6 am: 110 - IOB: 4h   - changes infusion site q 3 days, no pb with the infusion site  Pt checks his sugars 3-4x a day, but no sugars available >> pump broke >> will get a new pump tomorrow. - am: 116-209 >> 66 x1, 135-150 >> 112-136 >> 146-194 - after b'fast: 120-152 >> n/c >> 128 >> n/c - before lunch 102-204 >> 93-170 >> 92-130s >> 89-156 >> 130-140 >> 125-135 >> 120-150 >> 111-201 - after lunch: 140-220 >>not checking >> 156-188 >> 111-135 >> n/c >> n/c - before dinner: 83-205 (most 140-170) >> 76-151 >> 106-178 >> 142-150 >> 135-145 >> 121-195 - After dinner 145-268 >> not checking >> not checking >> 122-150 >> 99-188 >> 155-160 >> n/c - Bedtime 143-208 >> 92-263 >> 73-152 >> 91-215 >> 150s-160 >>  150-160 >> 95, 146-191 - nighttime: 40s x1, 56 x1 (in last 2 weeks) - when he takes too much insulin at bedtime! >> 60-70 sometimes >> had a 49!  Pt does not have chronic kidney disease, last BUN/creatinine was:  Lab Results  Component Value Date   BUN 14 01/10/2015   CREATININE 1.03 01/10/2015  On Lisinopril. Last set of lipids: Lab Results  Component Value Date   CHOL 159 10/07/2014   HDL 29.10* 10/07/2014   LDLCALC  02/14/2011    25        Total Cholesterol/HDL:CHD Risk Coronary Heart Disease Risk Table                     Men   Women  1/2 Average Risk   3.4   3.3  Average Risk       5.0   4.4  2 X Average Risk   9.6   7.1  3 X Average Risk  23.4   11.0        Use the calculated Patient Ratio above and the CHD Risk Table to determine the patient's CHD Risk.        ATP III CLASSIFICATION (LDL):  <100     mg/dL   Optimal  100-129  mg/dL   Near or Above                    Optimal  130-159  mg/dL   Borderline  160-189  mg/dL   High  >190     mg/dL   Very High   LDLDIRECT 101.0 10/07/2014   TRIG 263.0* 10/07/2014   CHOLHDL 5 10/07/2014  Lipids- per cardiology. Pt's last eye exam was in 01/2015 (no DR, reportedly) - Dr Delman Cheadle.  Has numbness and tingling in his legs - saw [facility].  Pt also has a history of HL, HTN, anxiety, kidney stones history of hydronephrosis, OSA-on CPAP, history of right distal radius fracture, colon polyps.  ROS: Constitutional: no weight gain/loss, no fatigue, + subjective hyperthermia Eyes: no blurry vision, no xerophthalmia ENT: no sore throat, no nodules palpated in throat, no dysphagia/odynophagia, no hoarseness Cardiovascular: no CP/SOB/palpitations/leg swelling Respiratory: no cough/SOB Gastrointestinal: no N/V/D/C Musculoskeletal: no muscle/joint aches Skin: no rashes Neurological: no tremors/numbness/tingling/dizziness  I reviewed pt's medications, allergies, PMH, social hx, family hx, and changes were documented in the  history of present illness. Otherwise, unchanged from my initial visit note.  PE: BP 132/84 mmHg  Pulse 91  Temp(Src) 97.9 F (36.6 C) (Oral)  Resp 12  Wt 239 lb (108.41 kg)  SpO2 97% Body mass index is 32.41 kg/(m^2). Wt Readings from Last 3 Encounters:  02/05/15 239 lb (108.41 kg)  01/28/15 236 lb 3.2 oz (107.14 kg)  10/08/14 236 lb (107.049 kg)   Constitutional: overweight, in NAD Eyes: PERRLA, EOMI, no exophthalmos ENT: moist mucous membranes, no thyromegaly, no cervical lymphadenopathy Cardiovascular: RRR, No MRG Respiratory: CTA B Gastrointestinal: abdomen soft, NT, ND, BS+ Musculoskeletal: no deformities, strength intact in all 4 Skin: moist, warm, no rashes Neurological: no tremor with outstretched hands, DTR normal in all 4  ASSESSMENT: 1. DM2, insulin-dependent, uncontrolled, with complications - coronary artery disease, history of MI - Peripheral neuropathy * on a T-slim pump  PLAN:  1. Patient with long-standing diabetes, with a much improved hemoglobin A1c after changing his settings at last visit >> HbA1c 6.2% at the [facility]. Will get a new pump tomorrow >> will continue current settings:  Patient Instructions  Please continue: - basal rates :  12 am: 1.25   6 am: 1.5 - ICR: 3, except 10:30 pm - 12 am: 4 - ISF: 20 - target: 12 am: 120   6 am: 110 - IOB: 4h   Please come back for a follow-up appointment in 2-2.5 months.  - check A1c at next visit - had eye exam recently - I will see him back in 2-3 months

## 2015-02-05 NOTE — Patient Instructions (Signed)
Please continue: - basal rates :  12 am: 1.25   6 am: 1.5 - ICR: 3, except 10:30 pm - 12 am: 4 - ISF: 20 - target: 12 am: 120   6 am: 110 - IOB: 4h   Please come back for a follow-up appointment in 2-2.5 months.

## 2015-02-11 ENCOUNTER — Other Ambulatory Visit: Payer: Self-pay | Admitting: Pulmonary Disease

## 2015-03-18 ENCOUNTER — Other Ambulatory Visit (INDEPENDENT_AMBULATORY_CARE_PROVIDER_SITE_OTHER): Payer: Medicare Other

## 2015-03-18 DIAGNOSIS — Z0000 Encounter for general adult medical examination without abnormal findings: Secondary | ICD-10-CM

## 2015-03-18 DIAGNOSIS — E785 Hyperlipidemia, unspecified: Secondary | ICD-10-CM | POA: Diagnosis not present

## 2015-03-18 DIAGNOSIS — N529 Male erectile dysfunction, unspecified: Secondary | ICD-10-CM

## 2015-03-18 DIAGNOSIS — E1310 Other specified diabetes mellitus with ketoacidosis without coma: Secondary | ICD-10-CM | POA: Diagnosis not present

## 2015-03-18 DIAGNOSIS — E1110 Type 2 diabetes mellitus with ketoacidosis without coma: Secondary | ICD-10-CM

## 2015-03-18 LAB — LIPID PANEL
Cholesterol: 138 mg/dL (ref 0–200)
HDL: 26.7 mg/dL — ABNORMAL LOW (ref >39.00)
NonHDL: 111.3
Total CHOL/HDL Ratio: 5
Triglycerides: 274 mg/dL — ABNORMAL HIGH (ref 0.0–149.0)
VLDL: 54.8 mg/dL — AB (ref 0.0–40.0)

## 2015-03-18 LAB — CBC WITH DIFFERENTIAL/PLATELET
BASOS ABS: 0 10*3/uL (ref 0.0–0.1)
BASOS PCT: 0.4 % (ref 0.0–3.0)
EOS ABS: 0.1 10*3/uL (ref 0.0–0.7)
Eosinophils Relative: 1.9 % (ref 0.0–5.0)
HEMATOCRIT: 40.6 % (ref 39.0–52.0)
Hemoglobin: 13.6 g/dL (ref 13.0–17.0)
LYMPHS PCT: 35.9 % (ref 12.0–46.0)
Lymphs Abs: 2.3 10*3/uL (ref 0.7–4.0)
MCHC: 33.6 g/dL (ref 30.0–36.0)
MCV: 89.2 fl (ref 78.0–100.0)
MONO ABS: 0.4 10*3/uL (ref 0.1–1.0)
Monocytes Relative: 6.1 % (ref 3.0–12.0)
NEUTROS ABS: 3.6 10*3/uL (ref 1.4–7.7)
Neutrophils Relative %: 55.7 % (ref 43.0–77.0)
PLATELETS: 236 10*3/uL (ref 150.0–400.0)
RBC: 4.55 Mil/uL (ref 4.22–5.81)
RDW: 13.7 % (ref 11.5–15.5)
WBC: 6.5 10*3/uL (ref 4.0–10.5)

## 2015-03-18 LAB — COMPREHENSIVE METABOLIC PANEL
ALT: 28 U/L (ref 0–53)
AST: 28 U/L (ref 0–37)
Albumin: 4.2 g/dL (ref 3.5–5.2)
Alkaline Phosphatase: 63 U/L (ref 39–117)
BUN: 19 mg/dL (ref 6–23)
CO2: 26 meq/L (ref 19–32)
Calcium: 9.4 mg/dL (ref 8.4–10.5)
Chloride: 103 mEq/L (ref 96–112)
Creatinine, Ser: 0.96 mg/dL (ref 0.40–1.50)
GFR: 82.18 mL/min (ref >60.00)
Glucose, Bld: 131 mg/dL — ABNORMAL HIGH (ref 70–99)
Potassium: 4.2 mEq/L (ref 3.5–5.1)
SODIUM: 138 meq/L (ref 135–145)
TOTAL PROTEIN: 6.6 g/dL (ref 6.0–8.3)
Total Bilirubin: 0.4 mg/dL (ref 0.2–1.2)

## 2015-03-18 LAB — POCT URINALYSIS DIPSTICK
BILIRUBIN UA: NEGATIVE
GLUCOSE UA: NEGATIVE
Ketones, UA: NEGATIVE
Leukocytes, UA: NEGATIVE
NITRITE UA: NEGATIVE
Protein, UA: NEGATIVE
RBC UA: NEGATIVE
Spec Grav, UA: 1.02
Urobilinogen, UA: 2
pH, UA: 7

## 2015-03-18 LAB — HEMOGLOBIN A1C: Hgb A1c MFr Bld: 6.4 % (ref 4.6–6.5)

## 2015-03-18 LAB — MICROALBUMIN / CREATININE URINE RATIO
Creatinine,U: 129.8 mg/dL
MICROALB UR: 4.2 mg/dL — AB (ref 0.0–1.9)
MICROALB/CREAT RATIO: 3.2 mg/g (ref 0.0–30.0)

## 2015-03-18 LAB — LDL CHOLESTEROL, DIRECT: LDL DIRECT: 78 mg/dL

## 2015-03-18 LAB — TSH: TSH: 2.79 u[IU]/mL (ref 0.35–4.50)

## 2015-03-25 ENCOUNTER — Ambulatory Visit (INDEPENDENT_AMBULATORY_CARE_PROVIDER_SITE_OTHER): Payer: Medicare Other | Admitting: Internal Medicine

## 2015-03-25 ENCOUNTER — Encounter: Payer: Self-pay | Admitting: Internal Medicine

## 2015-03-25 VITALS — BP 150/76 | HR 78 | Temp 98.4°F | Resp 20 | Ht 70.5 in | Wt 239.0 lb

## 2015-03-25 DIAGNOSIS — E1159 Type 2 diabetes mellitus with other circulatory complications: Secondary | ICD-10-CM

## 2015-03-25 DIAGNOSIS — E785 Hyperlipidemia, unspecified: Secondary | ICD-10-CM

## 2015-03-25 DIAGNOSIS — N528 Other male erectile dysfunction: Secondary | ICD-10-CM | POA: Diagnosis not present

## 2015-03-25 DIAGNOSIS — E1151 Type 2 diabetes mellitus with diabetic peripheral angiopathy without gangrene: Secondary | ICD-10-CM

## 2015-03-25 DIAGNOSIS — I10 Essential (primary) hypertension: Secondary | ICD-10-CM

## 2015-03-25 DIAGNOSIS — I2510 Atherosclerotic heart disease of native coronary artery without angina pectoris: Secondary | ICD-10-CM | POA: Diagnosis not present

## 2015-03-25 DIAGNOSIS — Z0000 Encounter for general adult medical examination without abnormal findings: Secondary | ICD-10-CM | POA: Diagnosis not present

## 2015-03-25 DIAGNOSIS — I252 Old myocardial infarction: Secondary | ICD-10-CM

## 2015-03-25 LAB — TESTOSTERONE: Testosterone: 239.05 ng/dL — ABNORMAL LOW (ref 300.00–890.00)

## 2015-03-25 MED ORDER — LORAZEPAM 1 MG PO TABS: 1.0000 mg | ORAL_TABLET | Freq: Two times a day (BID) | ORAL | Status: DC | PRN

## 2015-03-25 NOTE — Progress Notes (Signed)
Subjective:    Patient ID: Brent Gip., male    DOB: 1946/04/06, 69 y.o.   MRN: [id number]  HPI 69 year old patient who is followed closely by cardiology and endocrinology.  He has type 2 diabetes on a insulin pump.  He has CAD, history of hypertension, and dyslipidemia.  Doing quite well. He is also followed [facility]. He is seen today for an annual Medicare wellness exam  SH- he and his wife care for a grandchild after his son's death 3 years ago  Past Medical History  Diagnosis Date  . CAD (coronary artery disease)   . Myocardial infarction   . HTN (hypertension)   . Hyperlipidemia   . Obesity   . Diabetes mellitus   . Fatigue   . OSA (obstructive sleep apnea)   . Persistent disorder of initiating or maintaining sleep   . Erectile dysfunction   . Nephrolithiasis   . Diverticulosis   . Hemorrhoids   . Personal history of colonic polyps 02/05/2003  . Kidney stones     History   Social History  . Marital Status: Married    Spouse Name: N/A  . Number of Children: N/A  . Years of Education: N/A   Occupational History  . Not on file.   Social History Main Topics  . Smoking status: Former Smoker -- 1.50 packs/day for 30 years    Types: Cigarettes    Quit date: 09/19/1980  . Smokeless tobacco: Never Used  . Alcohol Use: 0.0 oz/week     Comment: social  . Drug Use: No  . Sexual Activity: Not on file   Other Topics Concern  . Not on file   Social History Narrative    Past Surgical History  Procedure Laterality Date  . Coronary stent placement      stenting of the right coronary artery with followup rotational  atherectomy.   . Cholecystectomy    . Coronary angioplasty    . Orthopedic surgery    . Rotator cuff surg      Bil  . Inguinal hernia repair      right  . Foot surgery      right  . Finger surgery      right    Family History  Problem Relation Age of Onset  . Liver cancer Father   . Lung cancer Father   . Cancer Father     . Heart disease Father   . Heart disease Sister   . Lung cancer Mother   . COPD Mother   . Cancer Mother   . Obesity Mother     Allergies  Allergen Reactions  . Penicillins Other (See Comments)    REACTION: unspecified    Current Outpatient Prescriptions on File Prior to Visit  Medication Sig Dispense Refill  . aspirin 81 MG tablet Take 81 mg by mouth daily.    . fenofibrate 160 MG tablet Take 160 mg by mouth daily.    Marland Kitchen glucagon (GLUCAGON EMERGENCY) 1 MG injection Inject 1 mg into the muscle once as needed. 1 each 12  . Insulin Human (INSULIN PUMP) SOLN Inject 1 each into the skin continuous. Novolog 1 unit for every 15 carbs    . lisinopril (PRINIVIL,ZESTRIL) 5 MG tablet Take 5 mg by mouth daily.      . metFORMIN (GLUCOPHAGE) 1000 MG tablet Take 1,000 mg by mouth 2 (two) times daily with a meal.    . Multiple Vitamins-Minerals (CENTRUM SILVER PO) 1 tab  po qd     . rosuvastatin (CRESTOR) 20 MG tablet Take 1 tablet (20 mg total) by mouth daily. 90 tablet 3  . sertraline (ZOLOFT) 100 MG tablet Take 100 mg by mouth daily. Total of 125mg  daily    . sertraline (ZOLOFT) 50 MG tablet Take 25 mg by mouth daily. Total of 125mg  daily    . vitamin B-12 (CYANOCOBALAMIN) 1000 MCG tablet Take 1,000 mcg by mouth daily.      Marland Kitchen zolpidem (AMBIEN) 10 MG tablet Take 10 mg by mouth at bedtime as needed. For insomnia    . HYDROcodone-homatropine (HYCODAN) 5-1.5 MG/5ML syrup Take 5 mLs by mouth every 8 (eight) hours as needed for cough. (Patient not taking: Reported on 02/05/2015) 120 mL 0   No current facility-administered medications on file prior to visit.    BP 150/76 mmHg  Pulse 78  Temp(Src) 98.4 F (36.9 C) (Oral)  Resp 20  Ht 5' 10.5" (1.791 m)  Wt 239 lb (108.41 kg)  BMI 33.80 kg/m2  SpO2 98%  1. Risk factors, based on past  M,S,F history.  Patient has known coronary artery disease.  Cardiovascular risk factors include diabetes hypertension and dyslipidemia  2.  Physical activities:  No major limitations.  He does walk 2 or 3 times per week  3.  Depression/mood: History mild depression, anxiety has been on sertraline  4.  Hearing: No major issues  5.  ADL's: Independent  6.  Fall risk: Low  7.  Home safety: No problems identified  8.  Height weight, and visual acuity; height and weight stable.  Recent eye examination on 01/29/2015  9.  Counseling: Heart healthy diet, weight loss more exercise.  All encouraged  10. Lab orders based on risk factors:  11. Referral : Follow-up cardiology, endocrinology urology  12. Care plan: Continue efforts at aggressive risk factor modification  13. Cognitive assessment: Alert and oriented with normal affect no cognitive dysfunction  14. Screening: Patient provided with a written and personalized 5-10 year screening schedule in the AVS.  .  We'll continue annual clinical examinations with screening lab.  Will continue on periodic colonoscopies.  This seen by urology annually with exam and PSA  15. Provider List Update: Includes primary care medicine and ophthalmology, cardiology, endocrinology and urology       Review of Systems  Constitutional: Positive for fatigue. Negative for fever, chills and appetite change.  HENT: Negative for congestion, dental problem, ear pain, hearing loss, sore throat, tinnitus, trouble swallowing and voice change.   Eyes: Negative for pain, discharge and visual disturbance.  Respiratory: Negative for cough, chest tightness, wheezing and stridor.   Cardiovascular: Negative for chest pain, palpitations and leg swelling.  Gastrointestinal: Negative for nausea, vomiting, abdominal pain, diarrhea, constipation, blood in stool and abdominal distention.  Genitourinary: Negative for urgency, hematuria, flank pain, discharge, difficulty urinating and genital sores.  Musculoskeletal: Positive for back pain. Negative for myalgias, joint swelling, arthralgias, gait problem and neck stiffness.  Skin:  Negative for rash.  Neurological: Negative for dizziness, syncope, speech difficulty, weakness, numbness and headaches.  Hematological: Negative for adenopathy. Does not bruise/bleed easily.  Psychiatric/Behavioral: Negative for behavioral problems and dysphoric mood. The patient is not nervous/anxious.        Objective:   Physical Exam  Constitutional: He is oriented to person, place, and time. He appears well-developed.  HENT:  Head: Normocephalic.  Right Ear: External ear normal.  Left Ear: External ear normal.  Pharyngeal crowding  Eyes: Conjunctivae and  EOM are normal.  Neck: Normal range of motion.  Cardiovascular: Normal rate, normal heart sounds and intact distal pulses.   Pulmonary/Chest: Breath sounds normal.  Abdominal: Bowel sounds are normal.  Musculoskeletal: Normal range of motion. He exhibits no edema or tenderness.  Neurological: He is alert and oriented to person, place, and time.  Skin:  Insulin pump site midabdominal wall area  Psychiatric: He has a normal mood and affect. His behavior is normal.          Assessment & Plan:   Diabetes mellitus.  Well controlled.  Followup endocrinology Hypertension.  We'll continue present therapy Coronary artery disease, stable.  Continue aggressive risk factor modification, and cardiology followup Dyslipidemia Fatigue.  The patient was seen at the [facility] by endocrinology and suggested a testosterone level.  Patient's wife is not in good health and although he has poor libido and ED sexual activity is not a concern  Recheck here 12 months

## 2015-03-25 NOTE — Progress Notes (Signed)
Pre visit review using our clinic review tool, if applicable. No additional management support is needed unless otherwise documented below in the visit note. 

## 2015-03-25 NOTE — Patient Instructions (Signed)
Limit your sodium (Salt) intake    It is important that you exercise regularly, at least 20 minutes 3 to 4 times per week.  If you develop chest pain or shortness of breath seek  medical attention.  You need to lose weight.  Consider a lower calorie diet and regular exercise. 

## 2015-04-13 ENCOUNTER — Telehealth: Payer: Self-pay | Admitting: Pulmonary Disease

## 2015-04-13 MED ORDER — FLUTICASONE PROPIONATE 50 MCG/ACT NA SUSP: 2.0000 | Freq: Every day | NASAL | Status: DC

## 2015-04-13 NOTE — Telephone Encounter (Signed)
Patient needs refill of Flonase, patient uses every night, do not see on current medication list, but is on history list.  Patient gets 3 bottles at a time due to insurance. Former patient of [facility], last OV 08/03/2014  Ok to refill Flonase?  MR - please advise.

## 2015-04-13 NOTE — Telephone Encounter (Signed)
Ok. BUt I prefer generic fluticasone if that is ok with him; chepaer

## 2015-04-13 NOTE — Telephone Encounter (Signed)
Called pt and aware RX sent in. Nothing further needed 

## 2015-04-15 ENCOUNTER — Encounter: Payer: Self-pay | Admitting: Internal Medicine

## 2015-04-16 ENCOUNTER — Ambulatory Visit: Payer: 59 | Admitting: Internal Medicine

## 2015-04-20 ENCOUNTER — Ambulatory Visit (INDEPENDENT_AMBULATORY_CARE_PROVIDER_SITE_OTHER): Payer: Medicare Other | Admitting: Internal Medicine

## 2015-04-20 ENCOUNTER — Encounter: Payer: Self-pay | Admitting: Internal Medicine

## 2015-04-20 DIAGNOSIS — M7061 Trochanteric bursitis, right hip: Secondary | ICD-10-CM | POA: Diagnosis not present

## 2015-04-20 DIAGNOSIS — I2510 Atherosclerotic heart disease of native coronary artery without angina pectoris: Secondary | ICD-10-CM

## 2015-04-20 NOTE — Progress Notes (Signed)
Pre visit review using our clinic review tool, if applicable. No additional management support is needed unless otherwise documented below in the visit note. 

## 2015-04-20 NOTE — Patient Instructions (Signed)
Trochanteric Bursitis You have hip pain due to trochanteric bursitis. Bursitis means that the sack near the outside of the hip is filled with fluid and inflamed. This sack is made up of protective soft tissue. The pain from trochanteric bursitis can be severe and keep you from sleep. It can radiate to the buttocks or down the outside of the thigh to the knee. The pain is almost always worse when rising from the seated or lying position and with walking. Pain can improve after you take a few steps. It happens more often in people with hip joint and lumbar spine problems, such as arthritis or previous surgery. Very rarely the trochanteric bursa can become infected, and antibiotics and/or surgery may be needed. Treatment often includes an injection of local anesthetic mixed with cortisone medicine. This medicine is injected into the area where it is most tender over the hip. Repeat injections may be necessary if the response to treatment is slow. You can apply ice packs over the tender area for 30 minutes every 2 hours for the next few days. Anti-inflammatory and/or narcotic pain medicine may also be helpful. Limit your activity for the next few days if the pain continues. See your caregiver in 5-10 days if you are not greatly improved.  SEEK IMMEDIATE MEDICAL CARE IF:  You develop severe pain, fever, or increased redness.  You have pain that radiates below the knee. EXERCISES STRETCHING EXERCISES - Trochanteric Bursitis  These exercises may help you when beginning to rehabilitate your injury. Your symptoms may resolve with or without further involvement from your physician, physical therapist, or athletic trainer. While completing these exercises, remember:   Restoring tissue flexibility helps normal motion to return to the joints. This allows healthier, less painful movement and activity.  An effective stretch should be held for at least 30 seconds.  A stretch should never be painful. You should only  feel a gentle lengthening or release in the stretched tissue. STRETCH - Iliotibial Band  On the floor or bed, lie on your side so your injured leg is on top. Bend your knee and grab your ankle.  Slowly bring your knee back so that your thigh is in line with your trunk. Keep your heel at your buttocks and gently arch your back so your head, shoulders and hips line up.  Slowly lower your leg so that your knee approaches the floor/bed until you feel a gentle stretch on the outside of your thigh. If you do not feel a stretch and your knee will not fall farther, place the heel of your opposite foot on top of your knee and pull your thigh down farther.  Hold this stretch for __________ seconds.  Repeat __________ times. Complete this exercise __________ times per day. STRETCH - Hamstrings, Supine   Lie on your back. Loop a belt or towel over the ball of your foot as shown.  Straighten your knee and slowly pull on the belt to raise your injured leg. Do not allow the knee to bend. Keep your opposite leg flat on the floor.  Raise the leg until you feel a gentle stretch behind your knee or thigh. Hold this position for __________ seconds.  Repeat __________ times. Complete this stretch __________ times per day. STRETCH - Quadriceps, Prone   Lie on your stomach on a firm surface, such as a bed or padded floor.  Bend your knee and grasp your ankle. If you are unable to reach your ankle or pant leg, use a belt   around your foot to lengthen your reach.  Gently pull your heel toward your buttocks. Your knee should not slide out to the side. You should feel a stretch in the front of your thigh and/or knee.  Hold this position for __________ seconds.  Repeat __________ times. Complete this stretch __________ times per day. STRETCHING - Hip Flexors, Lunge Half kneel with your knee on the floor and your opposite knee bent and directly over your ankle.  Keep good posture with your head over your  shoulders. Tighten your buttocks to point your tailbone downward; this will prevent your back from arching too much.  You should feel a gentle stretch in the front of your thigh and/or hip. If you do not feel any resistance, slightly slide your opposite foot forward and then slowly lunge forward so your knee once again lines up over your ankle. Be sure your tailbone remains pointed downward.  Hold this stretch for __________ seconds.  Repeat __________ times. Complete this stretch __________ times per day. STRETCH - Adductors, Lunge  While standing, spread your legs.  Lean away from your injured leg by bending your opposite knee. You may rest your hands on your thigh for balance.  You should feel a stretch in your inner thigh. Hold for __________ seconds.  Repeat __________ times. Complete this exercise __________ times per day. Document Released: 10/13/2004 Document Revised: 01/20/2014 Document Reviewed: 12/18/2008 Exit[employer] Patient Information 2015 [employer]. This information is not intended to replace advice given to you by your health care provider. Make sure you discuss any questions you have with your health care provider.  

## 2015-04-20 NOTE — Progress Notes (Signed)
Subjective:    Patient ID: Brent Gip., male    DOB: 06-Sep-1946, 69 y.o.   MRN: [id number]  HPI 69 year old patient who has a history of essential hypertension and diabetes.  He presents with a 4-5 day history of right lateral hip pain.  This was present when he awoke one morning.  Pain was initially quite severe, interfering with ambulation.  He remains uncomfortable, but the pain has moderated.  He has been using the ibuprofen.  Past Medical History  Diagnosis Date  . CAD (coronary artery disease)   . Myocardial infarction   . HTN (hypertension)   . Hyperlipidemia   . Obesity   . Diabetes mellitus   . Fatigue   . OSA (obstructive sleep apnea)   . Persistent disorder of initiating or maintaining sleep   . Erectile dysfunction   . Nephrolithiasis   . Diverticulosis   . Hemorrhoids   . Personal history of colonic polyps 02/05/2003  . Kidney stones     History   Social History  . Marital Status: Married    Spouse Name: N/A  . Number of Children: N/A  . Years of Education: N/A   Occupational History  . Not on file.   Social History Main Topics  . Smoking status: Former Smoker -- 1.50 packs/day for 30 years    Types: Cigarettes    Quit date: 09/19/1980  . Smokeless tobacco: Never Used  . Alcohol Use: 0.0 oz/week     Comment: social  . Drug Use: No  . Sexual Activity: Not on file   Other Topics Concern  . Not on file   Social History Narrative    Past Surgical History  Procedure Laterality Date  . Coronary stent placement      stenting of the right coronary artery with followup rotational  atherectomy.   . Cholecystectomy    . Coronary angioplasty    . Orthopedic surgery    . Rotator cuff surg      Bil  . Inguinal hernia repair      right  . Foot surgery      right  . Finger surgery      right    Family History  Problem Relation Age of Onset  . Liver cancer Father   . Lung cancer Father   . Cancer Father   . Heart disease Father   .  Heart disease Sister   . Lung cancer Mother   . COPD Mother   . Cancer Mother   . Obesity Mother     Allergies  Allergen Reactions  . Penicillins Other (See Comments)    REACTION: unspecified    Current Outpatient Prescriptions on File Prior to Visit  Medication Sig Dispense Refill  . aspirin 81 MG tablet Take 81 mg by mouth daily.    . fenofibrate 160 MG tablet Take 160 mg by mouth daily.    . fluticasone (FLONASE) 50 MCG/ACT nasal spray Place 2 sprays into both nostrils daily. 48 g 1  . glucagon (GLUCAGON EMERGENCY) 1 MG injection Inject 1 mg into the muscle once as needed. 1 each 12  . Insulin Human (INSULIN PUMP) SOLN Inject 1 each into the skin continuous. Novolog 1 unit for every 15 carbs    . lisinopril (PRINIVIL,ZESTRIL) 5 MG tablet Take 5 mg by mouth daily.      Marland Kitchen LORazepam (ATIVAN) 1 MG tablet Take 1 tablet (1 mg total) by mouth 2 (two) times daily as needed for  anxiety. 180 tablet 1  . metFORMIN (GLUCOPHAGE) 1000 MG tablet Take 1,000 mg by mouth 2 (two) times daily with a meal.    . Multiple Vitamins-Minerals (CENTRUM SILVER PO) 1 tab po qd     . rosuvastatin (CRESTOR) 20 MG tablet Take 1 tablet (20 mg total) by mouth daily. 90 tablet 3  . sertraline (ZOLOFT) 100 MG tablet Take 100 mg by mouth daily. Total of 125mg  daily    . sertraline (ZOLOFT) 50 MG tablet Take 25 mg by mouth daily. Total of 125mg  daily    . vitamin B-12 (CYANOCOBALAMIN) 1000 MCG tablet Take 1,000 mcg by mouth daily.      Marland Kitchen zolpidem (AMBIEN) 10 MG tablet Take 10 mg by mouth at bedtime as needed. For insomnia     No current facility-administered medications on file prior to visit.    BP 140/70 mmHg  Pulse 84  Temp(Src) 98.4 F (36.9 C) (Oral)  Resp 20  Ht 5' 10.5" (1.791 m)  Wt 239 lb (108.41 kg)  BMI 33.80 kg/m2  SpO2 96%      Review of Systems  Constitutional: Negative for fever, chills, appetite change and fatigue.  HENT: Negative for congestion, dental problem, ear pain, hearing loss,  sore throat, tinnitus, trouble swallowing and voice change.   Eyes: Negative for pain, discharge and visual disturbance.  Respiratory: Negative for cough, chest tightness, wheezing and stridor.   Cardiovascular: Negative for chest pain, palpitations and leg swelling.  Gastrointestinal: Negative for nausea, vomiting, abdominal pain, diarrhea, constipation, blood in stool and abdominal distention.  Genitourinary: Negative for urgency, hematuria, flank pain, discharge, difficulty urinating and genital sores.  Musculoskeletal: Negative for myalgias, back pain, joint swelling, arthralgias, gait problem and neck stiffness.       Right hip pain  Skin: Negative for rash.  Neurological: Negative for dizziness, syncope, speech difficulty, weakness, numbness and headaches.  Hematological: Negative for adenopathy. Does not bruise/bleed easily.  Psychiatric/Behavioral: Negative for behavioral problems and dysphoric mood. The patient is not nervous/anxious.        Objective:   Physical Exam  Constitutional: He appears well-developed and well-nourished. No distress.  Musculoskeletal:  Tenderness over the right greater trochanter area.  Overlying skin was normal without erythema  Full range of motion of the right hip          Assessment & Plan:   Right greater trochanteric bursitis  Procedure note.  After informed consent.  The area about the right lateral hip area was prepped with Betadine and alcohol swabbing.  After local anesthesia with 1% Xylocaine, 1 cc of 80 mg of Depo-Medrol was injected about the right greater trochanter area. After local lidocaine injection, the patient had pain relief.  A Band-Aid applied  Patient information dispensed  Patient will call if unimproved

## 2015-05-20 MED ORDER — METHYLPREDNISOLONE ACETATE 80 MG/ML IJ SUSP
80.0000 mg | Freq: Once | INTRAMUSCULAR | Status: AC
  Administered 2015-04-20: 80 mg via INTRAMUSCULAR

## 2015-05-20 NOTE — Addendum Note (Signed)
Addended by: Marian Sorrow on: 05/20/2015 02:58 PM   Modules accepted: Orders

## 2015-06-15 ENCOUNTER — Telehealth: Payer: Self-pay | Admitting: Pulmonary Disease

## 2015-06-15 DIAGNOSIS — Z9989 Dependence on other enabling machines and devices: Principal | ICD-10-CM

## 2015-06-15 DIAGNOSIS — G4733 Obstructive sleep apnea (adult) (pediatric): Secondary | ICD-10-CM

## 2015-06-15 NOTE — Telephone Encounter (Signed)
lmtcb for pt.  

## 2015-06-16 NOTE — Telephone Encounter (Signed)
Spoke with pt, states that he was set up to get cpap supplies through [facility], but they no longer are contracted with Medicare.  Pt is needing to be switched to another DME provider.  PCC's please advise if you know which company Medicare patients can switch to.  Thanks!

## 2015-06-16 NOTE — Telephone Encounter (Signed)
Brent [name] with [facility] @ [phone] needs new order for CPAP supplies.

## 2015-06-16 NOTE — Telephone Encounter (Signed)
lmtcb X1 for Mandy with [facility].   Pt has a recall in system to see any of our 3 sleep docs in November.   VS are you ok with ordering these cpap supplies?  Thanks!

## 2015-06-16 NOTE — Telephone Encounter (Signed)
I spoke to Brent [name] about this issue she stated to send to [facility] that they can take [place] and Medicare. Sent staff message to [facility]

## 2015-06-16 NOTE — Telephone Encounter (Signed)
mandy returning call and can be reached @ [phone].Brent [name]

## 2015-06-17 NOTE — Telephone Encounter (Signed)
Patient notified. Order entered to renew CPAP supplies. Nothing further needed.

## 2015-06-17 NOTE — Telephone Encounter (Signed)
Okay to send order for new CPAP supplies. 

## 2015-06-26 DIAGNOSIS — N401 Enlarged prostate with lower urinary tract symptoms: Secondary | ICD-10-CM | POA: Diagnosis not present

## 2015-06-26 DIAGNOSIS — N2889 Other specified disorders of kidney and ureter: Secondary | ICD-10-CM | POA: Diagnosis not present

## 2015-06-26 DIAGNOSIS — N281 Cyst of kidney, acquired: Secondary | ICD-10-CM | POA: Diagnosis not present

## 2015-06-26 DIAGNOSIS — N481 Balanitis: Secondary | ICD-10-CM | POA: Diagnosis not present

## 2015-06-26 DIAGNOSIS — E291 Testicular hypofunction: Secondary | ICD-10-CM | POA: Diagnosis not present

## 2015-06-26 DIAGNOSIS — N200 Calculus of kidney: Secondary | ICD-10-CM | POA: Diagnosis not present

## 2015-07-03 ENCOUNTER — Emergency Department (HOSPITAL_COMMUNITY): Payer: No Typology Code available for payment source

## 2015-07-03 ENCOUNTER — Emergency Department (HOSPITAL_COMMUNITY)
Admission: EM | Admit: 2015-07-03 | Discharge: 2015-07-03 | Disposition: A | Discharge status: Home / Self Care | Payer: No Typology Code available for payment source | Attending: Emergency Medicine | Admitting: Emergency Medicine

## 2015-07-03 ENCOUNTER — Encounter (HOSPITAL_COMMUNITY): Payer: Self-pay | Admitting: *Deleted

## 2015-07-03 DIAGNOSIS — R1032 Left lower quadrant pain: Secondary | ICD-10-CM | POA: Diagnosis not present

## 2015-07-03 DIAGNOSIS — Y9389 Activity, other specified: Secondary | ICD-10-CM | POA: Diagnosis not present

## 2015-07-03 DIAGNOSIS — I10 Essential (primary) hypertension: Secondary | ICD-10-CM | POA: Insufficient documentation

## 2015-07-03 DIAGNOSIS — E119 Type 2 diabetes mellitus without complications: Secondary | ICD-10-CM | POA: Diagnosis not present

## 2015-07-03 DIAGNOSIS — Z87891 Personal history of nicotine dependence: Secondary | ICD-10-CM | POA: Diagnosis not present

## 2015-07-03 DIAGNOSIS — M542 Cervicalgia: Secondary | ICD-10-CM | POA: Diagnosis not present

## 2015-07-03 DIAGNOSIS — M25562 Pain in left knee: Secondary | ICD-10-CM | POA: Diagnosis not present

## 2015-07-03 DIAGNOSIS — Z7982 Long term (current) use of aspirin: Secondary | ICD-10-CM | POA: Insufficient documentation

## 2015-07-03 DIAGNOSIS — I252 Old myocardial infarction: Secondary | ICD-10-CM | POA: Insufficient documentation

## 2015-07-03 DIAGNOSIS — Y92410 Unspecified street and highway as the place of occurrence of the external cause: Secondary | ICD-10-CM | POA: Diagnosis not present

## 2015-07-03 DIAGNOSIS — I2510 Atherosclerotic heart disease of native coronary artery without angina pectoris: Secondary | ICD-10-CM | POA: Diagnosis not present

## 2015-07-03 DIAGNOSIS — S301XXA Contusion of abdominal wall, initial encounter: Secondary | ICD-10-CM | POA: Diagnosis not present

## 2015-07-03 DIAGNOSIS — S3992XA Unspecified injury of lower back, initial encounter: Secondary | ICD-10-CM | POA: Insufficient documentation

## 2015-07-03 DIAGNOSIS — Y998 Other external cause status: Secondary | ICD-10-CM | POA: Insufficient documentation

## 2015-07-03 DIAGNOSIS — M25571 Pain in right ankle and joints of right foot: Secondary | ICD-10-CM | POA: Diagnosis not present

## 2015-07-03 DIAGNOSIS — S99921A Unspecified injury of right foot, initial encounter: Secondary | ICD-10-CM | POA: Diagnosis present

## 2015-07-03 DIAGNOSIS — Z86010 Personal history of colonic polyps: Secondary | ICD-10-CM | POA: Insufficient documentation

## 2015-07-03 DIAGNOSIS — E669 Obesity, unspecified: Secondary | ICD-10-CM | POA: Diagnosis not present

## 2015-07-03 DIAGNOSIS — R51 Headache: Secondary | ICD-10-CM | POA: Diagnosis not present

## 2015-07-03 DIAGNOSIS — R079 Chest pain, unspecified: Secondary | ICD-10-CM | POA: Diagnosis not present

## 2015-07-03 DIAGNOSIS — S300XXA Contusion of lower back and pelvis, initial encounter: Secondary | ICD-10-CM | POA: Diagnosis not present

## 2015-07-03 DIAGNOSIS — M25561 Pain in right knee: Secondary | ICD-10-CM | POA: Diagnosis not present

## 2015-07-03 DIAGNOSIS — S92191A Other fracture of right talus, initial encounter for closed fracture: Secondary | ICD-10-CM | POA: Insufficient documentation

## 2015-07-03 DIAGNOSIS — S2020XA Contusion of thorax, unspecified, initial encounter: Secondary | ICD-10-CM

## 2015-07-03 DIAGNOSIS — Z87442 Personal history of urinary calculi: Secondary | ICD-10-CM | POA: Diagnosis not present

## 2015-07-03 DIAGNOSIS — E785 Hyperlipidemia, unspecified: Secondary | ICD-10-CM | POA: Diagnosis not present

## 2015-07-03 DIAGNOSIS — Z7951 Long term (current) use of inhaled steroids: Secondary | ICD-10-CM | POA: Diagnosis not present

## 2015-07-03 DIAGNOSIS — S92151A Displaced avulsion fracture (chip fracture) of right talus, initial encounter for closed fracture: Secondary | ICD-10-CM | POA: Diagnosis not present

## 2015-07-03 DIAGNOSIS — M545 Low back pain: Secondary | ICD-10-CM | POA: Diagnosis not present

## 2015-07-03 DIAGNOSIS — Z9861 Coronary angioplasty status: Secondary | ICD-10-CM | POA: Diagnosis not present

## 2015-07-03 LAB — CBC
HEMATOCRIT: 39.9 % (ref 39.0–52.0)
Hemoglobin: 12.9 g/dL — ABNORMAL LOW (ref 13.0–17.0)
MCH: 29.5 pg (ref 26.0–34.0)
MCHC: 32.3 g/dL (ref 30.0–36.0)
MCV: 91.3 fL (ref 78.0–100.0)
Platelets: 251 10*3/uL (ref 150–400)
RBC: 4.37 MIL/uL (ref 4.22–5.81)
RDW: 12.9 % (ref 11.5–15.5)
WBC: 9.5 10*3/uL (ref 4.0–10.5)

## 2015-07-03 LAB — COMPREHENSIVE METABOLIC PANEL
ALT: 29 U/L (ref 17–63)
AST: 35 U/L (ref 15–41)
Albumin: 3.8 g/dL (ref 3.5–5.0)
Alkaline Phosphatase: 65 U/L (ref 38–126)
Anion gap: 10 (ref 5–15)
BILIRUBIN TOTAL: 0.4 mg/dL (ref 0.3–1.2)
BUN: 20 mg/dL (ref 6–20)
CO2: 24 mmol/L (ref 22–32)
CREATININE: 0.96 mg/dL (ref 0.61–1.24)
Calcium: 9.6 mg/dL (ref 8.9–10.3)
Chloride: 106 mmol/L (ref 101–111)
Glucose, Bld: 106 mg/dL — ABNORMAL HIGH (ref 65–99)
POTASSIUM: 4.2 mmol/L (ref 3.5–5.1)
Sodium: 140 mmol/L (ref 135–145)
TOTAL PROTEIN: 6.8 g/dL (ref 6.5–8.1)

## 2015-07-03 LAB — CBG MONITORING, ED: GLUCOSE-CAPILLARY: 118 mg/dL — AB (ref 65–99)

## 2015-07-03 MED ORDER — FENTANYL CITRATE (PF) 100 MCG/2ML IJ SOLN
50.0000 ug | INTRAMUSCULAR | Status: DC | PRN
  Administered 2015-07-03 (×2): 50 ug via INTRAVENOUS
  Filled 2015-07-03 (×2): qty 2

## 2015-07-03 MED ORDER — IBUPROFEN 600 MG PO TABS: 600.0000 mg | ORAL_TABLET | Freq: Four times a day (QID) | ORAL | Status: DC | PRN

## 2015-07-03 MED ORDER — HYDROCODONE-ACETAMINOPHEN 5-325 MG PO TABS
2.0000 | ORAL_TABLET | ORAL | Status: DC | PRN
Start: 2015-07-03 — End: 2015-08-03

## 2015-07-03 MED ORDER — IOHEXOL 300 MG/ML  SOLN
100.0000 mL | Freq: Once | INTRAMUSCULAR | Status: AC | PRN
  Administered 2015-07-03: 100 mL via INTRAVENOUS

## 2015-07-03 NOTE — ED Notes (Signed)
CT contacted re: scan, pt & family updated on delay
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
## 2015-07-09 DIAGNOSIS — S8254XA Nondisplaced fracture of medial malleolus of right tibia, initial encounter for closed fracture: Secondary | ICD-10-CM | POA: Diagnosis not present

## 2015-07-13 ENCOUNTER — Other Ambulatory Visit: Payer: Self-pay | Admitting: *Deleted

## 2015-07-13 ENCOUNTER — Ambulatory Visit (INDEPENDENT_AMBULATORY_CARE_PROVIDER_SITE_OTHER): Payer: Medicare Other | Admitting: Internal Medicine

## 2015-07-13 ENCOUNTER — Other Ambulatory Visit (INDEPENDENT_AMBULATORY_CARE_PROVIDER_SITE_OTHER): Payer: Medicare Other | Admitting: *Deleted

## 2015-07-13 ENCOUNTER — Encounter: Payer: Self-pay | Admitting: Internal Medicine

## 2015-07-13 VITALS — BP 118/62 | HR 84 | Temp 98.0°F | Resp 12 | Wt 241.0 lb

## 2015-07-13 DIAGNOSIS — E1151 Type 2 diabetes mellitus with diabetic peripheral angiopathy without gangrene: Secondary | ICD-10-CM

## 2015-07-13 DIAGNOSIS — I2510 Atherosclerotic heart disease of native coronary artery without angina pectoris: Secondary | ICD-10-CM

## 2015-07-13 DIAGNOSIS — Z23 Encounter for immunization: Secondary | ICD-10-CM

## 2015-07-13 LAB — POCT GLYCOSYLATED HEMOGLOBIN (HGB A1C): Hemoglobin A1C: 6.5
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
## 2015-07-13 NOTE — Patient Instructions (Signed)
Please change the pump settings as follows: - basal rates :  12 am: 1.25 >> 1.35   6 am: 1.5   10:30 pm: 1.3 >> 1.5 - ICR: 3   except 10:30 pm - 12 am: 4 - ISF: 20 >> 15 - target: 12 am: 120   6 am: 110 - IOB: 4h   Please come back for a follow-up appointment in 3 months.

## 2015-07-27 DIAGNOSIS — S8254XD Nondisplaced fracture of medial malleolus of right tibia, subsequent encounter for closed fracture with routine healing: Secondary | ICD-10-CM | POA: Diagnosis not present

## 2015-08-03 ENCOUNTER — Encounter: Payer: Self-pay | Admitting: Internal Medicine

## 2015-08-03 ENCOUNTER — Ambulatory Visit (INDEPENDENT_AMBULATORY_CARE_PROVIDER_SITE_OTHER): Payer: Medicare Other | Admitting: Internal Medicine

## 2015-08-03 VITALS — BP 158/70 | HR 85 | Temp 98.2°F | Ht 72.0 in | Wt 240.8 lb

## 2015-08-03 DIAGNOSIS — I2510 Atherosclerotic heart disease of native coronary artery without angina pectoris: Secondary | ICD-10-CM | POA: Diagnosis not present

## 2015-08-03 DIAGNOSIS — J069 Acute upper respiratory infection, unspecified: Secondary | ICD-10-CM | POA: Diagnosis not present

## 2015-08-03 DIAGNOSIS — B9789 Other viral agents as the cause of diseases classified elsewhere: Principal | ICD-10-CM

## 2015-08-03 MED ORDER — HYDROCODONE-HOMATROPINE 5-1.5 MG/5ML PO SYRP: 5.0000 mL | ORAL_SOLUTION | ORAL | Status: DC | PRN

## 2015-08-03 NOTE — Progress Notes (Signed)
Pre visit review using our clinic review tool, if applicable. No additional management support is needed unless otherwise documented below in the visit note.  Chief Complaint  Patient presents with  . Cough    Started on Friday  . Nasal Congestion    HPI: Patient Brent [name].  comes in today for SDA for  new problem evaluation. PCP NA Cough  Including  At night    Drainage   Some heaviness.  For 3 days  No fever  Gets  Spring and fall.    otc not that helpful  Cough med  To help calm at night   No hx asthma or copd . No sob clills fever  Tessalon perles dont help much ROS: See pertinent positives and negatives per HPI. No resp pain   Past Medical History  Diagnosis Date  . CAD (coronary artery disease)   . Myocardial infarction ([place])   . HTN (hypertension)   . Hyperlipidemia   . Obesity   . Diabetes mellitus   . Fatigue   . OSA (obstructive sleep apnea)   . Persistent disorder of initiating or maintaining sleep   . Erectile dysfunction   . Nephrolithiasis   . Diverticulosis   . Hemorrhoids   . Personal history of colonic polyps 02/05/2003  . Kidney stones     Family History  Problem Relation Age of Onset  . Liver cancer Father   . Lung cancer Father   . Cancer Father   . Heart disease Father   . Heart disease Sister   . Lung cancer Mother   . COPD Mother   . Cancer Mother   . Obesity Mother     Social History   Social History  . Marital Status: Married    Spouse Name: N/A  . Number of Children: N/A  . Years of Education: N/A   Social History Main Topics  . Smoking status: Former Smoker -- 1.50 packs/day for 30 years    Types: Cigarettes    Quit date: 09/19/1980  . Smokeless tobacco: Never Used  . Alcohol Use: 0.0 oz/week     Comment: social  . Drug Use: No  . Sexual Activity: Not Asked   Other Topics Concern  . None   Social History Narrative    Outpatient Prescriptions Prior to Visit  Medication Sig Dispense Refill  . aspirin 81 MG  tablet Take 81 mg by mouth daily.    . fenofibrate 160 MG tablet Take 160 mg by mouth daily.    . fluticasone (FLONASE) 50 MCG/ACT nasal spray Place 2 sprays into both nostrils daily. 48 g 1  . ibuprofen (ADVIL,MOTRIN) 600 MG tablet Take 1 tablet (600 mg total) by mouth every 6 (six) hours as needed for moderate pain. 30 tablet 0  . Insulin Human (INSULIN PUMP) SOLN Inject 1 each into the skin continuous. Novolog 1 unit for every 15 carbs    . lisinopril (PRINIVIL,ZESTRIL) 5 MG tablet Take 5 mg by mouth daily.      Marland Kitchen LORazepam (ATIVAN) 1 MG tablet Take 1 tablet (1 mg total) by mouth 2 (two) times daily as needed for anxiety. 180 tablet 1  . metFORMIN (GLUCOPHAGE) 1000 MG tablet Take 1,000 mg by mouth 2 (two) times daily with a meal.    . Multiple Vitamins-Minerals (CENTRUM SILVER PO) 1 tab po qd     . rosuvastatin (CRESTOR) 20 MG tablet Take 1 tablet (20 mg total) by mouth daily. 90 tablet 3  .  sertraline (ZOLOFT) 100 MG tablet Take 100 mg by mouth daily. Total of 125mg  daily    . sertraline (ZOLOFT) 50 MG tablet Take 25 mg by mouth daily. Total of 125mg  daily    . vitamin B-12 (CYANOCOBALAMIN) 1000 MCG tablet Take 1,000 mcg by mouth daily.      Marland Kitchen zolpidem (AMBIEN) 10 MG tablet Take 10 mg by mouth at bedtime as needed. For insomnia    . HYDROcodone-acetaminophen (NORCO/VICODIN) 5-325 MG tablet Take 2 tablets by mouth every 4 (four) hours as needed. 6 tablet 0  . glucagon (GLUCAGON EMERGENCY) 1 MG injection Inject 1 mg into the muscle once as needed. (Patient not taking: Reported on 08/03/2015) 1 each 12   No facility-administered medications prior to visit.     EXAM:  BP 158/70 mmHg  Pulse 85  Temp(Src) 98.2 F (36.8 C) (Oral)  Ht 6' (1.829 m)  Wt 240 lb 12.8 oz (109.226 kg)  BMI 32.65 kg/m2  SpO2 97%  Body mass index is 32.65 kg/(m^2).  GENERAL: vitals reviewed and listed above, alert, oriented, appears well hydrated and in no acute distress throat clearing   Nl resp HEENT:  atraumatic, conjunctiva  clear, no obvious abnormalities on inspection of external nose and ears tms ok OP : no lesion edema or exudate  NECK: no obvious masses on inspection palpation  LUNGS: clear to auscultation bilaterally, no wheezes, rales or rhonchi,  CV: HRRR, no clubbing cyanosis or  peripheral edema nl cap refill  MS: moves all extremities without noticeable focal  abnormality PSYCH: pleasant and cooperative,  Skin nl cap refill   ASSESSMENT AND PLAN:  Discussed the following assessment and plan:  Viral upper respiratory tract infection with cough - uncomplicated vs  allergic caution with ough med  use as needed expectant managment return for alarm sx.  On acei inhibitor  Bu no hx of problem  Reassuring exam. -Patient advised to return or notify health care team  if symptoms worsen ,persist or new concerns arise.  Patient Instructions  This acts like a chest cold   Although could have some alelrfgy infolved Could may last  10 - 18 days .  But should feeling better by then Contact us if fever    Shortness of breath      Cough, Adult Coughing is a reflex that clears your throat and your airways. Coughing helps to heal and protect your lungs. It is normal to cough occasionally, but a cough that happens with other symptoms or lasts a long time may be a sign of a condition that needs treatment. A cough may last only 2-3 weeks (acute), or it may last longer than 8 weeks (chronic). CAUSES Coughing is commonly caused by:  Breathing in substances that irritate your lungs.  A viral or bacterial respiratory infection.  Allergies.  Asthma.  Postnasal drip.  Smoking.  Acid backing up from the stomach into the esophagus (gastroesophageal reflux).  Certain medicines.  Chronic lung problems, including COPD (or rarely, lung cancer).  Other medical conditions such as heart failure. HOME CARE INSTRUCTIONS  Pay attention to any changes in your symptoms. Take these actions to  help with your discomfort:  Take medicines only as told by your health care provider.  If you were prescribed an antibiotic medicine, take it as told by your health care provider. Do not stop taking the antibiotic even if you start to feel better.  Talk with your health care provider before you take a cough suppressant medicine.  Drink enough fluid to keep your urine clear or pale yellow.  If the air is dry, use a cold steam vaporizer or humidifier in your bedroom or your home to help loosen secretions.  Avoid anything that causes you to cough at work or at home.  If your cough is worse at night, try sleeping in a semi-upright position.  Avoid cigarette smoke. If you smoke, quit smoking. If you need help quitting, ask your health care provider.  Avoid caffeine.  Avoid alcohol.  Rest as needed. SEEK MEDICAL CARE IF:   You have new symptoms.  You cough up pus.  Your cough does not get better after 2-3 weeks, or your cough gets worse.  You cannot control your cough with suppressant medicines and you are losing sleep.  You develop pain that is getting worse or pain that is not controlled with pain medicines.  You have a fever.  You have unexplained weight loss.  You have night sweats. SEEK IMMEDIATE MEDICAL CARE IF:  You cough up blood.  You have difficulty breathing.  Your heartbeat is very fast.   This information is not intended to replace advice given to you by your health care provider. Make sure you discuss any questions you have with your health care provider.   Document Released: 03/04/2011 Document Revised: 05/27/2015 Document Reviewed: 11/12/2014 Elsevier Interactive Patient Education 2016 [place] K. Panosh M.D.

## 2015-08-03 NOTE — Patient Instructions (Signed)
This acts like a chest cold   Although could have some alelrfgy infolved Could may last  10 - 18 days .  But should feeling better by then Contact us if fever    Shortness of breath      Cough, Adult Coughing is a reflex that clears your throat and your airways. Coughing helps to heal and protect your lungs. It is normal to cough occasionally, but a cough that happens with other symptoms or lasts a long time may be a sign of a condition that needs treatment. A cough may last only 2-3 weeks (acute), or it may last longer than 8 weeks (chronic). CAUSES Coughing is commonly caused by:  Breathing in substances that irritate your lungs.  A viral or bacterial respiratory infection.  Allergies.  Asthma.  Postnasal drip.  Smoking.  Acid backing up from the stomach into the esophagus (gastroesophageal reflux).  Certain medicines.  Chronic lung problems, including COPD (or rarely, lung cancer).  Other medical conditions such as heart failure. HOME CARE INSTRUCTIONS  Pay attention to any changes in your symptoms. Take these actions to help with your discomfort:  Take medicines only as told by your health care provider.  If you were prescribed an antibiotic medicine, take it as told by your health care provider. Do not stop taking the antibiotic even if you start to feel better.  Talk with your health care provider before you take a cough suppressant medicine.  Drink enough fluid to keep your urine clear or pale yellow.  If the air is dry, use a cold steam vaporizer or humidifier in your bedroom or your home to help loosen secretions.  Avoid anything that causes you to cough at work or at home.  If your cough is worse at night, try sleeping in a semi-upright position.  Avoid cigarette smoke. If you smoke, quit smoking. If you need help quitting, ask your health care provider.  Avoid caffeine.  Avoid alcohol.  Rest as needed. SEEK MEDICAL CARE IF:   You have new  symptoms.  You cough up pus.  Your cough does not get better after 2-3 weeks, or your cough gets worse.  You cannot control your cough with suppressant medicines and you are losing sleep.  You develop pain that is getting worse or pain that is not controlled with pain medicines.  You have a fever.  You have unexplained weight loss.  You have night sweats. SEEK IMMEDIATE MEDICAL CARE IF:  You cough up blood.  You have difficulty breathing.  Your heartbeat is very fast.   This information is not intended to replace advice given to you by your health care provider. Make sure you discuss any questions you have with your health care provider.   Document Released: 03/04/2011 Document Revised: 05/27/2015 Document Reviewed: 11/12/2014 Elsevier Interactive Patient Education [employer].

## 2015-08-06 ENCOUNTER — Ambulatory Visit: Payer: Medicare Other | Admitting: Pulmonary Disease

## 2015-08-07 ENCOUNTER — Encounter: Payer: Self-pay | Admitting: Internal Medicine

## 2015-08-07 ENCOUNTER — Ambulatory Visit (INDEPENDENT_AMBULATORY_CARE_PROVIDER_SITE_OTHER): Payer: Medicare Other | Admitting: Internal Medicine

## 2015-08-07 VITALS — BP 130/78 | HR 81 | Ht 72.0 in | Wt 239.6 lb

## 2015-08-07 DIAGNOSIS — G4733 Obstructive sleep apnea (adult) (pediatric): Secondary | ICD-10-CM

## 2015-08-07 DIAGNOSIS — I2510 Atherosclerotic heart disease of native coronary artery without angina pectoris: Secondary | ICD-10-CM | POA: Diagnosis not present

## 2015-08-07 DIAGNOSIS — G4700 Insomnia, unspecified: Secondary | ICD-10-CM | POA: Diagnosis not present

## 2015-08-07 NOTE — Assessment & Plan Note (Signed)
Uses Ambien most nights. We discussed basics of good sleep hygiene

## 2015-08-07 NOTE — Assessment & Plan Note (Signed)
He is satisfied that CPAP has improved his quality of life. Plan-continue CPAP/[facility]. Obtain download

## 2015-08-07 NOTE — Progress Notes (Signed)
07/31/14- Dr Gwenette Greet The patient comes in today for follow-up of his obstructive sleep apnea. He is wearing C Pap compliantly, and is having no issues with his mask fit or pressure. He feels that he sleeps adequately, and has no daytime sleepiness. His weight has gone up 9 pounds since last visit, and I've encouraged him to work on this.   08/07/2015-69 year old male former smoker followed for obstructive sleep apnea, insomnia complicated by CAD/MI/stent, DM FOLLOWS FOR: DME is [facility]-Wears CPAP every night for aobut 7 hours;No recent DL. He is not sure about his pressure but is comfortable with fullface mask. Told he rarely snores through CPAP. Takes Ambien 10 mg most nights to help with chronic insomnia. Sleep is disturbed by somatic pains including old ankle fracture.  ROS-see HPI   Negative unless "+" Constitutional:    weight loss, night sweats, fevers, chills, fatigue, lassitude. HEENT:    headaches, difficulty swallowing, tooth/dental problems, sore throat,       sneezing, itching, ear ache, nasal congestion, post nasal drip, snoring CV:    chest pain, orthopnea, PND, swelling in lower extremities, anasarca,                                                       dizziness, palpitations Resp:   shortness of breath with exertion or at rest.                productive cough,   non-productive cough, coughing up of blood.              change in color of mucus.  wheezing.   Skin:    rash or lesions. GI:  No-   heartburn, indigestion, abdominal pain, nausea, vomiting,  GU:  MS:   joint pain, stiffness,  Neuro-     nothing unusual Psych:  change in mood or affect.  depression or anxiety.   memory loss.  OBJ- Physical Exam General- Alert, Oriented, Affect-appropriate, Distress- none acute Skin- rash-none, lesions- none, excoriation- none Lymphadenopathy- none Head- atraumatic            Eyes- Gross vision intact, PERRLA, conjunctivae and secretions clear            Ears- Hearing,  canals-normal            Nose- Clear, no-Septal dev, mucus, polyps, erosion, perforation             Throat- Mallampati II-III , mucosa clear , drainage- none, tonsils- atrophic Neck- flexible , trachea midline, no stridor , thyroid nl, carotid no bruit Chest - symmetrical excursion , unlabored           Heart/CV- RRR , no murmur , no gallop  , no rub, nl s1 s2                           - JVD- none , edema- none, stasis changes- none, varices- none           Lung- clear to P&A, wheeze- none, cough + dry , dullness-none, rub- none           Chest wall-  Abd-  Br/ Gen/ Rectal- Not done, not indicated Extrem- cyanosis- none, clubbing, none, atrophy- none, strength- nl Neuro- grossly intact to observation

## 2015-08-07 NOTE — Assessment & Plan Note (Signed)
He denies recent acute issues. Managed by cardiology.

## 2015-08-07 NOTE — Patient Instructions (Signed)
Order- DME [facility] continue CPAP current pressure, mask of choice, humidifier, supplies. AirView  Dx OSA

## 2015-08-11 ENCOUNTER — Ambulatory Visit: Payer: 59 | Admitting: Adult Health
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
## 2015-08-19 ENCOUNTER — Ambulatory Visit (INDEPENDENT_AMBULATORY_CARE_PROVIDER_SITE_OTHER): Payer: Medicare Other | Admitting: Cardiovascular Disease

## 2015-08-19 ENCOUNTER — Encounter: Payer: Self-pay | Admitting: Cardiovascular Disease

## 2015-08-19 VITALS — BP 138/64 | HR 87 | Ht 71.5 in | Wt 237.1 lb

## 2015-08-19 DIAGNOSIS — E785 Hyperlipidemia, unspecified: Secondary | ICD-10-CM

## 2015-08-19 DIAGNOSIS — I2510 Atherosclerotic heart disease of native coronary artery without angina pectoris: Secondary | ICD-10-CM

## 2015-08-19 DIAGNOSIS — I10 Essential (primary) hypertension: Secondary | ICD-10-CM

## 2015-08-19 NOTE — Patient Instructions (Signed)

## 2015-08-24 DIAGNOSIS — S8254XD Nondisplaced fracture of medial malleolus of right tibia, subsequent encounter for closed fracture with routine healing: Secondary | ICD-10-CM | POA: Diagnosis not present

## 2015-09-10 ENCOUNTER — Encounter: Payer: Self-pay | Admitting: Adult Health

## 2015-09-10 ENCOUNTER — Ambulatory Visit (INDEPENDENT_AMBULATORY_CARE_PROVIDER_SITE_OTHER): Payer: Medicare Other | Admitting: Adult Health

## 2015-09-10 DIAGNOSIS — I2510 Atherosclerotic heart disease of native coronary artery without angina pectoris: Secondary | ICD-10-CM

## 2015-09-10 DIAGNOSIS — H6503 Acute serous otitis media, bilateral: Secondary | ICD-10-CM | POA: Diagnosis not present

## 2015-09-10 NOTE — Progress Notes (Signed)
Pre visit review using our clinic review tool, if applicable. No additional management support is needed unless otherwise documented below in the visit note. 

## 2015-09-10 NOTE — Progress Notes (Signed)
Subjective:    Patient ID: Brent Gip., male    DOB: Dec 24, 1945, 69 y.o.   MRN: [id number]  HPI  69 year old male who presents to the office today for the feeling of " ear fullness" and he endorses that some days he has trouble hearing. He complains of a " pressure" type pain. The pressure gets worse over the course of the afternoon.   He denies Tinnitus, sinus pain or pressure.   He has not tried anything over the counter.   Review of Systems  Constitutional: Negative.   HENT: Positive for ear pain and hearing loss. Negative for ear discharge, postnasal drip, rhinorrhea, sinus pressure and voice change.   Neurological: Negative.   All other systems reviewed and are negative.  Past Medical History  Diagnosis Date  . CAD (coronary artery disease)   . Myocardial infarction ([place])   . HTN (hypertension)   . Hyperlipidemia   . Obesity   . Diabetes mellitus   . Fatigue   . OSA (obstructive sleep apnea)   . Persistent disorder of initiating or maintaining sleep   . Erectile dysfunction   . Nephrolithiasis   . Diverticulosis   . Hemorrhoids   . Personal history of colonic polyps 02/05/2003  . Kidney stones     Social History   Social History  . Marital Status: Married    Spouse Name: N/A  . Number of Children: N/A  . Years of Education: N/A   Occupational History  . Not on file.   Social History Main Topics  . Smoking status: Former Smoker -- 1.50 packs/day for 30 years    Types: Cigarettes    Quit date: 09/19/1980  . Smokeless tobacco: Never Used  . Alcohol Use: 0.0 oz/week     Comment: social  . Drug Use: No  . Sexual Activity: Not on file   Other Topics Concern  . Not on file   Social History Narrative    Past Surgical History  Procedure Laterality Date  . Coronary stent placement      stenting of the right coronary artery with followup rotational  atherectomy.   . Cholecystectomy    . Coronary angioplasty    . Orthopedic surgery    .  Rotator cuff surg      Bil  . Inguinal hernia repair      right  . Foot surgery      right  . Finger surgery      right    Family History  Problem Relation Age of Onset  . Liver cancer Father   . Lung cancer Father   . Cancer Father   . Heart disease Father   . Heart disease Sister   . Lung cancer Mother   . COPD Mother   . Cancer Mother   . Obesity Mother     Allergies  Allergen Reactions  . Penicillins Hives and Rash    Current Outpatient Prescriptions on File Prior to Visit  Medication Sig Dispense Refill  . aspirin 81 MG tablet Take 81 mg by mouth daily.    . fenofibrate 160 MG tablet Take 160 mg by mouth daily.    . fluticasone (FLONASE) 50 MCG/ACT nasal spray Place 2 sprays into both nostrils daily. 48 g 1  . glucagon (GLUCAGON EMERGENCY) 1 MG injection Inject 1 mg into the muscle once as needed. 1 each 12  . ibuprofen (ADVIL,MOTRIN) 600 MG tablet Take 1 tablet (600 mg total) by mouth  every 6 (six) hours as needed for moderate pain. 30 tablet 0  . Insulin Human (INSULIN PUMP) SOLN Inject 1 each into the skin continuous. Novolog 1 unit for every 15 carbs    . lisinopril (PRINIVIL,ZESTRIL) 5 MG tablet Take 5 mg by mouth daily.      Marland Kitchen LORazepam (ATIVAN) 1 MG tablet Take 1 tablet (1 mg total) by mouth 2 (two) times daily as needed for anxiety. 180 tablet 1  . metFORMIN (GLUCOPHAGE) 1000 MG tablet Take 1,000 mg by mouth 2 (two) times daily with a meal.    . Multiple Vitamins-Minerals (CENTRUM SILVER PO) 1 tab po qd     . rosuvastatin (CRESTOR) 20 MG tablet Take 1 tablet (20 mg total) by mouth daily. 90 tablet 3  . sertraline (ZOLOFT) 100 MG tablet Take 100 mg by mouth daily. Total of 125mg  daily    . sertraline (ZOLOFT) 50 MG tablet Take 25 mg by mouth daily. Total of 125mg  daily    . vitamin B-12 (CYANOCOBALAMIN) 1000 MCG tablet Take 1,000 mcg by mouth daily.      Marland Kitchen zolpidem (AMBIEN) 10 MG tablet Take 10 mg by mouth at bedtime as needed. For insomnia     No current  facility-administered medications on file prior to visit.    BP 120/68 mmHg  Temp(Src) 98.2 F (36.8 C) (Oral)  Ht 6' (1.829 m)  Wt 237 lb 3.2 oz (107.593 kg)  BMI 32.16 kg/m2       Objective:   Physical Exam  Constitutional: He is oriented to person, place, and time. He appears well-developed and well-nourished. No distress.  HENT:  Head: Normocephalic and atraumatic.  Right Ear: External ear normal.  Left Ear: External ear normal.  Nose: Nose normal.  Mouth/Throat: Oropharynx is clear and moist. No oropharyngeal exudate.  Fluid noted behind both TM's. No signs of acute infection  Cardiovascular: Normal rate, regular rhythm, normal heart sounds and intact distal pulses.  Exam reveals no gallop and no friction rub.   No murmur heard. Pulmonary/Chest: Effort normal and breath sounds normal. No respiratory distress. He has no wheezes. He has no rales. He exhibits no tenderness.  Neurological: He is alert and oriented to person, place, and time.  Skin: Skin is warm and dry. No rash noted. He is not diaphoretic. No erythema. No pallor.  Psychiatric: He has a normal mood and affect. His behavior is normal. Judgment and thought content normal.  Nursing note and vitals reviewed.     Assessment & Plan:  1. Bilateral acute serous otitis media, recurrence not specified - Use Flonase every day for the next week - Sudafed as directed for the next 2 days - Follow up if no improvement.

## 2015-09-10 NOTE — Patient Instructions (Addendum)
It was great meeting you today!  Exam showed fluid behind the tympanic membrane. It does not appear you have an infection at this time.   As discussed please use Sudafed for the next 2-3 days. If you continue to have this sensation, please let myself or Dr. Raliegh Ip know.     Serous Otitis Media Serous otitis media is fluid in the middle ear space. This space contains the bones for hearing and air. Air in the middle ear space helps to transmit sound.  The air gets there through the eustachian tube. This tube goes from the back of the nose (nasopharynx) to the middle ear space. It keeps the pressure in the middle ear the same as the outside world. It also helps to drain fluid from the middle ear space. CAUSES  Serous otitis media occurs when the eustachian tube gets blocked. Blockage can come from:  Ear infections.  Colds and other upper respiratory infections.  Allergies.  Irritants such as cigarette smoke.  Sudden changes in air pressure (such as descending in an airplane).  Enlarged adenoids.  A mass in the nasopharynx. During colds and upper respiratory infections, the middle ear space can become temporarily filled with fluid. This can happen after an ear infection also. Once the infection clears, the fluid will generally drain out of the ear through the eustachian tube. If it does not, then serous otitis media occurs. SIGNS AND SYMPTOMS   Hearing loss.  A feeling of fullness in the ear, without pain.  Young children may not show any symptoms but may show slight behavioral changes, such as agitation, ear pulling, or crying. DIAGNOSIS  Serous otitis media is diagnosed by an ear exam. Tests may be done to check on the movement of the eardrum. Hearing exams may also be done. TREATMENT  The fluid most often goes away without treatment. If allergy is the cause, allergy treatment may be helpful. Fluid that persists for several months may require minor surgery. A small tube is placed in  the eardrum to:  Drain the fluid.  Restore the air in the middle ear space. In certain situations, antibiotic medicines are used to avoid surgery. Surgery may be done to remove enlarged adenoids (if this is the cause). HOME CARE INSTRUCTIONS   Keep children away from tobacco smoke.  Keep all follow-up visits as directed by your health care provider. SEEK MEDICAL CARE IF:   Your hearing is not better in 3 months.  Your hearing is worse.  You have ear pain.  You have drainage from the ear.  You have dizziness.  You have serous otitis media only in one ear or have any bleeding from your nose (epistaxis).  You notice a lump on your neck. MAKE SURE YOU:  Understand these instructions.   Will watch your condition.   Will get help right away if you are not doing well or get worse.    This information is not intended to replace advice given to you by your health care provider. Make sure you discuss any questions you have with your health care provider.   Document Released: 11/26/2003 Document Revised: 09/26/2014 Document Reviewed: 04/02/2013 Elsevier Interactive Patient Education [employer].

## 2015-09-22 ENCOUNTER — Telehealth: Payer: Self-pay | Admitting: Internal Medicine

## 2015-09-22 NOTE — Telephone Encounter (Signed)
Patient reported that he got his pump working by putting in his old cartridge.  He refilled his old cartridge and it is running now without any problems.  We reviewed the fact that if his pump fails, he will need to give his basal insulin X 4 q 4 hours.  He reported good understanding of this. He was told to call me to schedule a time to see me tomorrow if he needs help putting in his settings into his new pump.  He was advised to write the settings down now incase his pump stops working.  He agreed to do this. He had no final questions.

## 2015-09-22 NOTE — Telephone Encounter (Signed)
Patient stated that he has a T slim meter and it is not working, he called the company they are sending him a new one, but until then he need to know how much insulin to give himself. Please advise

## 2015-09-22 NOTE — Telephone Encounter (Signed)
Please read message below and advise.  

## 2015-09-25 ENCOUNTER — Telehealth: Payer: Self-pay | Admitting: Internal Medicine

## 2015-09-25 NOTE — Telephone Encounter (Signed)
Appt scheduled 09/29/15 with Dr. Burnice Logan. Thanks!

## 2015-09-25 NOTE — Telephone Encounter (Signed)
[facility] - Client [facility] Patient Name: Brent [name] DOB: 07-11-1946 Initial Comment Caller states ears started clogging up before Christmas, saw PA. Saw some fluid in ear, told to call back if not better. Then got cold, now cannot hear out of one ear. Nurse Assessment Nurse: Markus Daft, RN, Sherre Poot Date/Time (Eastern Time): 09/25/2015 9:04:17 AM Confirm and document reason for call. If symptomatic, describe symptoms. ---Caller states ears started clogging up 2-3 months ago. Saw PA for some fluid in right ear, but shouldn't affect hearing, and to take Sudafed and if not better in 2-3 days then call back. Then got cold which is better. Now cannot hear out of right ear hardly at all. Has the patient traveled out of the country within the last 30 days? ---Not Applicable Does the patient have any new or worsening symptoms? ---Yes Will a triage be completed? ---Yes Related visit to physician within the last 2 weeks? ---Yes Does the PT have any chronic conditions? (i.e. diabetes, asthma, etc.) ---Yes List chronic conditions. ---Diabetic, high cholesterol, CAD, MI x 2 about 9 - 15 years ago, on low dose of lisinopril to protect the kidneys Is this a behavioral health or substance abuse call? ---No Guidelines Guideline Title Affirmed Question Affirmed Notes Hearing Loss or Change [1] Hearing loss in one or both ears AND [2] sudden onset AND [3] present now Final Disposition User See Physician within 4 Hours (or PCP triage) Markus Daft, RN, Windy Comments Checked with office. There is nothing available, and to go to UC or ER. - RN advised that would check with his MD (thru messaging) and if any new advice to call the patient back. Caller verb. understanding and will wait an hour to hear back. Referrals REFERRED TO PCP OFFICE Disagree/Comply: Comply

## 2015-09-29 ENCOUNTER — Ambulatory Visit (INDEPENDENT_AMBULATORY_CARE_PROVIDER_SITE_OTHER): Payer: Medicare Other | Admitting: Internal Medicine

## 2015-09-29 ENCOUNTER — Encounter: Payer: Self-pay | Admitting: Internal Medicine

## 2015-09-29 VITALS — BP 160/76 | HR 84 | Temp 98.5°F | Resp 20 | Ht 72.0 in | Wt 240.0 lb

## 2015-09-29 DIAGNOSIS — H6591 Unspecified nonsuppurative otitis media, right ear: Secondary | ICD-10-CM | POA: Diagnosis not present

## 2015-09-29 DIAGNOSIS — E1151 Type 2 diabetes mellitus with diabetic peripheral angiopathy without gangrene: Secondary | ICD-10-CM | POA: Diagnosis not present

## 2015-09-29 DIAGNOSIS — I10 Essential (primary) hypertension: Secondary | ICD-10-CM

## 2015-09-29 NOTE — Progress Notes (Signed)
Pre visit review using our clinic review tool, if applicable. No additional management support is needed unless otherwise documented below in the visit note. 

## 2015-09-29 NOTE — Progress Notes (Signed)
Subjective:    Patient ID: Brent [name]., male    DOB: December 03, 1945, 70 y.o.   MRN: [id number]  HPI  Lab Results  Component Value Date   HGBA1C 6.5 07/13/2015    BP Readings from Last 3 Encounters:  09/29/15 160/76  09/10/15 120/68  08/19/15 35/70    70 year old patient who has diabetes, hypertension and coronary artery disease.  For over 2 months.  He has had fullness in the right ear associated with diminished acuity.  A few weeks ago he had bilateral fullness in the ears, but more recently has lateralized again to the right.  He has tried anti-histamines decongestants and is on fluticasone without benefit.  There is been no significant fever, postnasal drip or focal pain. Presently, his complaints are fullness in the right ear associated with diminished auditory acuity.  Past Medical History  Diagnosis Date  . CAD (coronary artery disease)   . Myocardial infarction ([place])   . HTN (hypertension)   . Hyperlipidemia   . Obesity   . Diabetes mellitus   . Fatigue   . OSA (obstructive sleep apnea)   . Persistent disorder of initiating or maintaining sleep   . Erectile dysfunction   . Nephrolithiasis   . Diverticulosis   . Hemorrhoids   . Personal history of colonic polyps 02/05/2003  . Kidney stones     Social History   Social History  . Marital Status: Married    Spouse Name: N/A  . Number of Children: N/A  . Years of Education: N/A   Occupational History  . Not on file.   Social History Main Topics  . Smoking status: Former Smoker -- 1.50 packs/day for 30 years    Types: Cigarettes    Quit date: 09/19/1980  . Smokeless tobacco: Never Used  . Alcohol Use: 0.0 oz/week     Comment: social  . Drug Use: No  . Sexual Activity: Not on file   Other Topics Concern  . Not on file   Social History Narrative    Past Surgical History  Procedure Laterality Date  . Coronary stent placement      stenting of the right coronary artery with followup rotational   atherectomy.   . Cholecystectomy    . Coronary angioplasty    . Orthopedic surgery    . Rotator cuff surg      Bil  . Inguinal hernia repair      right  . Foot surgery      right  . Finger surgery      right    Family History  Problem Relation Age of Onset  . Liver cancer Father   . Lung cancer Father   . Cancer Father   . Heart disease Father   . Heart disease Sister   . Lung cancer Mother   . COPD Mother   . Cancer Mother   . Obesity Mother     Allergies  Allergen Reactions  . Penicillins Hives and Rash    Current Outpatient Prescriptions on File Prior to Visit  Medication Sig Dispense Refill  . aspirin 81 MG tablet Take 81 mg by mouth daily.    . fenofibrate 160 MG tablet Take 160 mg by mouth daily.    . fluticasone (FLONASE) 50 MCG/ACT nasal spray Place 2 sprays into both nostrils daily. 48 g 1  . glucagon (GLUCAGON EMERGENCY) 1 MG injection Inject 1 mg into the muscle once as needed. 1 each 12  . ibuprofen (  ADVIL,MOTRIN) 600 MG tablet Take 1 tablet (600 mg total) by mouth every 6 (six) hours as needed for moderate pain. 30 tablet 0  . Insulin Human (INSULIN PUMP) SOLN Inject 1 each into the skin continuous. Novolog 1 unit for every 15 carbs    . lisinopril (PRINIVIL,ZESTRIL) 5 MG tablet Take 5 mg by mouth daily.      Marland Kitchen LORazepam (ATIVAN) 1 MG tablet Take 1 tablet (1 mg total) by mouth 2 (two) times daily as needed for anxiety. 180 tablet 1  . metFORMIN (GLUCOPHAGE) 1000 MG tablet Take 1,000 mg by mouth 2 (two) times daily with a meal.    . Multiple Vitamins-Minerals (CENTRUM SILVER PO) 1 tab po qd     . rosuvastatin (CRESTOR) 20 MG tablet Take 1 tablet (20 mg total) by mouth daily. 90 tablet 3  . sertraline (ZOLOFT) 100 MG tablet Take 100 mg by mouth daily. Total of 125mg  daily    . sertraline (ZOLOFT) 50 MG tablet Take 25 mg by mouth daily. Total of 125mg  daily    . vitamin B-12 (CYANOCOBALAMIN) 1000 MCG tablet Take 1,000 mcg by mouth daily.      Marland Kitchen zolpidem  (AMBIEN) 10 MG tablet Take 10 mg by mouth at bedtime as needed. For insomnia     No current facility-administered medications on file prior to visit.    BP 160/76 mmHg  Pulse 84  Temp(Src) 98.5 F (36.9 C) (Oral)  Resp 20  Ht 6' (1.829 m)  Wt 240 lb (108.863 kg)  BMI 32.54 kg/m2  SpO2 98%     Review of Systems  Constitutional: Negative for fever, chills, appetite change and fatigue.  HENT: Positive for congestion and hearing loss. Negative for dental problem, ear pain, sore throat, tinnitus, trouble swallowing and voice change.   Eyes: Negative for pain, discharge and visual disturbance.  Respiratory: Negative for cough, chest tightness, wheezing and stridor.   Cardiovascular: Negative for chest pain, palpitations and leg swelling.  Gastrointestinal: Negative for nausea, vomiting, abdominal pain, diarrhea, constipation, blood in stool and abdominal distention.  Genitourinary: Negative for urgency, hematuria, flank pain, discharge, difficulty urinating and genital sores.  Musculoskeletal: Negative for myalgias, back pain, joint swelling, arthralgias, gait problem and neck stiffness.  Skin: Negative for rash.  Neurological: Negative for dizziness, syncope, speech difficulty, weakness, numbness and headaches.  Hematological: Negative for adenopathy. Does not bruise/bleed easily.  Psychiatric/Behavioral: Negative for behavioral problems and dysphoric mood. The patient is not nervous/anxious.        Objective:   Physical Exam  Constitutional: He appears well-developed and well-nourished. No distress.  Repeat blood pressure 144/70  HENT:  Both tympanic membranes appeared normal Weber does not lateralize Decreased hearing on the right          Assessment & Plan:  Probable serous otitis.  This has been a chronic.  Will set up for ENT evaluation Essential hypertension Diabetes

## 2015-09-29 NOTE — Patient Instructions (Addendum)
Serous Otitis Media Serous otitis media is fluid in the middle ear space. This space contains the bones for hearing and air. Air in the middle ear space helps to transmit sound.  The air gets there through the eustachian tube. This tube goes from the back of the nose (nasopharynx) to the middle ear space. It keeps the pressure in the middle ear the same as the outside world. It also helps to drain fluid from the middle ear space. CAUSES  Serous otitis media occurs when the eustachian tube gets blocked. Blockage can come from:  Ear infections.  Colds and other upper respiratory infections.  Allergies.  Irritants such as cigarette smoke.  Sudden changes in air pressure (such as descending in an airplane).  Enlarged adenoids.  A mass in the nasopharynx. During colds and upper respiratory infections, the middle ear space can become temporarily filled with fluid. This can happen after an ear infection also. Once the infection clears, the fluid will generally drain out of the ear through the eustachian tube. If it does not, then serous otitis media occurs. SIGNS AND SYMPTOMS   Hearing loss.  A feeling of fullness in the ear, without pain.  Young children may not show any symptoms but may show slight behavioral changes, such as agitation, ear pulling, or crying. DIAGNOSIS  Serous otitis media is diagnosed by an ear exam. Tests may be done to check on the movement of the eardrum. Hearing exams may also be done. TREATMENT  The fluid most often goes away without treatment. If allergy is the cause, allergy treatment may be helpful. Fluid that persists for several months may require minor surgery. A small tube is placed in the eardrum to:  Drain the fluid.  Restore the air in the middle ear space. In certain situations, antibiotic medicines are used to avoid surgery. Surgery may be done to remove enlarged adenoids (if this is the cause). HOME CARE INSTRUCTIONS   Keep children away from  tobacco smoke.  Keep all follow-up visits as directed by your health care provider. SEEK MEDICAL CARE IF:   Your hearing is not better in 3 months.  Your hearing is worse.  You have ear pain.  You have drainage from the ear.  You have dizziness.  You have serous otitis media only in one ear or have any bleeding from your nose (epistaxis).  You notice a lump on your neck. MAKE SURE YOU:  Understand these instructions.   Will watch your condition.   Will get help right away if you are not doing well or get worse.    This information is not intended to replace advice given to you by your health care provider. Make sure you discuss any questions you have with your health care provider.   Document Released: 11/26/2003 Document Revised: 09/26/2014 Document Reviewed: 04/02/2013 Elsevier Interactive Patient Education 2016 [employer].   ENT evaluation as discussed

## 2015-09-30 DIAGNOSIS — D1801 Hemangioma of skin and subcutaneous tissue: Secondary | ICD-10-CM | POA: Diagnosis not present

## 2015-09-30 DIAGNOSIS — D485 Neoplasm of uncertain behavior of skin: Secondary | ICD-10-CM | POA: Diagnosis not present

## 2015-09-30 DIAGNOSIS — C44519 Basal cell carcinoma of skin of other part of trunk: Secondary | ICD-10-CM | POA: Diagnosis not present

## 2015-09-30 DIAGNOSIS — L821 Other seborrheic keratosis: Secondary | ICD-10-CM | POA: Diagnosis not present

## 2015-09-30 DIAGNOSIS — L570 Actinic keratosis: Secondary | ICD-10-CM | POA: Diagnosis not present

## 2015-10-01 DIAGNOSIS — H9071 Mixed conductive and sensorineural hearing loss, unilateral, right ear, with unrestricted hearing on the contralateral side: Secondary | ICD-10-CM | POA: Diagnosis not present

## 2015-10-01 DIAGNOSIS — H903 Sensorineural hearing loss, bilateral: Secondary | ICD-10-CM | POA: Diagnosis not present

## 2015-10-01 DIAGNOSIS — H6521 Chronic serous otitis media, right ear: Secondary | ICD-10-CM | POA: Diagnosis not present

## 2015-10-12 DIAGNOSIS — M9903 Segmental and somatic dysfunction of lumbar region: Secondary | ICD-10-CM | POA: Diagnosis not present

## 2015-10-12 DIAGNOSIS — M5116 Intervertebral disc disorders with radiculopathy, lumbar region: Secondary | ICD-10-CM | POA: Diagnosis not present

## 2015-10-13 ENCOUNTER — Other Ambulatory Visit (INDEPENDENT_AMBULATORY_CARE_PROVIDER_SITE_OTHER): Payer: Medicare Other | Admitting: *Deleted

## 2015-10-13 ENCOUNTER — Ambulatory Visit (INDEPENDENT_AMBULATORY_CARE_PROVIDER_SITE_OTHER): Payer: Medicare Other | Admitting: Internal Medicine

## 2015-10-13 ENCOUNTER — Encounter: Payer: Self-pay | Admitting: Internal Medicine

## 2015-10-13 VITALS — BP 114/62 | HR 87 | Temp 98.3°F | Resp 12 | Wt 236.6 lb

## 2015-10-13 DIAGNOSIS — M5116 Intervertebral disc disorders with radiculopathy, lumbar region: Secondary | ICD-10-CM | POA: Diagnosis not present

## 2015-10-13 DIAGNOSIS — M9903 Segmental and somatic dysfunction of lumbar region: Secondary | ICD-10-CM | POA: Diagnosis not present

## 2015-10-13 DIAGNOSIS — E1151 Type 2 diabetes mellitus with diabetic peripheral angiopathy without gangrene: Secondary | ICD-10-CM

## 2015-10-13 LAB — POCT GLYCOSYLATED HEMOGLOBIN (HGB A1C): Hemoglobin A1C: 6.6

## 2015-10-13 NOTE — Patient Instructions (Addendum)
Please change the pump settings as follows: - basal rates :  12 am: 1.35 >> 1.45   6 am: 1.5 >> 1.6   10:30 pm: 1.5 >> 1.6 - ICR: 3, except 10:30 pm - 12 am: 4 >> 3.5 - ISF: 15, except from 6 am to 12 am: 20 - target:   12 am: 120   6 am: 110 - IOB: 4h   Please come back for a follow-up appointment in 3 months.
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
## 2015-10-19 DIAGNOSIS — M9903 Segmental and somatic dysfunction of lumbar region: Secondary | ICD-10-CM | POA: Diagnosis not present

## 2015-10-19 DIAGNOSIS — H903 Sensorineural hearing loss, bilateral: Secondary | ICD-10-CM | POA: Diagnosis not present

## 2015-10-19 DIAGNOSIS — M5116 Intervertebral disc disorders with radiculopathy, lumbar region: Secondary | ICD-10-CM | POA: Diagnosis not present

## 2015-10-21 DIAGNOSIS — M5116 Intervertebral disc disorders with radiculopathy, lumbar region: Secondary | ICD-10-CM | POA: Diagnosis not present

## 2015-10-21 DIAGNOSIS — M9903 Segmental and somatic dysfunction of lumbar region: Secondary | ICD-10-CM | POA: Diagnosis not present

## 2015-10-22 DIAGNOSIS — M5116 Intervertebral disc disorders with radiculopathy, lumbar region: Secondary | ICD-10-CM | POA: Diagnosis not present

## 2015-10-22 DIAGNOSIS — M9903 Segmental and somatic dysfunction of lumbar region: Secondary | ICD-10-CM | POA: Diagnosis not present

## 2015-10-26 DIAGNOSIS — M5116 Intervertebral disc disorders with radiculopathy, lumbar region: Secondary | ICD-10-CM | POA: Diagnosis not present

## 2015-10-26 DIAGNOSIS — M9903 Segmental and somatic dysfunction of lumbar region: Secondary | ICD-10-CM | POA: Diagnosis not present

## 2015-10-28 ENCOUNTER — Telehealth: Payer: Self-pay | Admitting: Internal Medicine

## 2015-10-28 DIAGNOSIS — M9903 Segmental and somatic dysfunction of lumbar region: Secondary | ICD-10-CM | POA: Diagnosis not present

## 2015-10-28 DIAGNOSIS — M5116 Intervertebral disc disorders with radiculopathy, lumbar region: Secondary | ICD-10-CM | POA: Diagnosis not present

## 2015-10-28 NOTE — Telephone Encounter (Signed)
Patient would like for the LPN to give him a call concerning some medication that he has run out of.

## 2015-10-29 NOTE — Telephone Encounter (Signed)
Left message on voicemail to call office.  

## 2015-10-30 NOTE — Telephone Encounter (Signed)
Spoke to pt, asked him what medication he needed? Pt said he got it all straightened out at the [place] and has all of his medications now. Told pt okay call if you need anything. Pt verbalized understanding.

## 2015-11-04 DIAGNOSIS — M5116 Intervertebral disc disorders with radiculopathy, lumbar region: Secondary | ICD-10-CM | POA: Diagnosis not present

## 2015-11-04 DIAGNOSIS — M9903 Segmental and somatic dysfunction of lumbar region: Secondary | ICD-10-CM | POA: Diagnosis not present

## 2015-11-09 DIAGNOSIS — M9903 Segmental and somatic dysfunction of lumbar region: Secondary | ICD-10-CM | POA: Diagnosis not present

## 2015-11-09 DIAGNOSIS — M5116 Intervertebral disc disorders with radiculopathy, lumbar region: Secondary | ICD-10-CM | POA: Diagnosis not present

## 2015-11-11 DIAGNOSIS — M5116 Intervertebral disc disorders with radiculopathy, lumbar region: Secondary | ICD-10-CM | POA: Diagnosis not present

## 2015-11-11 DIAGNOSIS — M9903 Segmental and somatic dysfunction of lumbar region: Secondary | ICD-10-CM | POA: Diagnosis not present

## 2015-11-17 DIAGNOSIS — M5116 Intervertebral disc disorders with radiculopathy, lumbar region: Secondary | ICD-10-CM | POA: Diagnosis not present

## 2015-11-17 DIAGNOSIS — M9903 Segmental and somatic dysfunction of lumbar region: Secondary | ICD-10-CM | POA: Diagnosis not present

## 2015-11-25 DIAGNOSIS — M9903 Segmental and somatic dysfunction of lumbar region: Secondary | ICD-10-CM | POA: Diagnosis not present

## 2015-11-25 DIAGNOSIS — M5116 Intervertebral disc disorders with radiculopathy, lumbar region: Secondary | ICD-10-CM | POA: Diagnosis not present

## 2015-12-01 ENCOUNTER — Ambulatory Visit (INDEPENDENT_AMBULATORY_CARE_PROVIDER_SITE_OTHER): Payer: Medicare Other | Admitting: Family Medicine

## 2015-12-01 ENCOUNTER — Encounter: Payer: Self-pay | Admitting: Family Medicine

## 2015-12-01 VITALS — BP 126/74 | HR 94 | Temp 98.4°F | Ht 72.0 in | Wt 243.4 lb

## 2015-12-01 DIAGNOSIS — J069 Acute upper respiratory infection, unspecified: Secondary | ICD-10-CM | POA: Diagnosis not present

## 2015-12-01 MED ORDER — BENZONATATE 100 MG PO CAPS: 100.0000 mg | ORAL_CAPSULE | Freq: Two times a day (BID) | ORAL | Status: DC | PRN

## 2015-12-01 NOTE — Patient Instructions (Signed)
INSTRUCTIONS FOR UPPER RESPIRATORY INFECTION:  - AFRIN nasal spray twice daily for 5 days - but do NOT use longer then 5 days  -can use tylenol as directed for aches and sorethroat  -in the winter time, using a humidifier at night is helpful (please follow cleaning instructions)  -if you are taking a cough medication - use only as directed, may also try a teaspoon of honey to coat the throat and throat lozenges.   -for sore throat, salt water gargles can help  -follow up if you have fevers, facial pain, tooth pain, difficulty breathing or are worsening or symptoms persist longer then expected  Upper Respiratory Infection, Adult An upper respiratory infection (URI) is also known as the common cold. It is often caused by a type of germ (virus). Colds are easily spread (contagious). You can pass it to others by kissing, coughing, sneezing, or drinking out of the same glass. Usually, you get better in 1 to 3  weeks.  However, the cough can last for even longer. HOME CARE   Only take medicine as told by your doctor. Follow instructions provided above.  Drink enough water and fluids to keep your pee (urine) clear or pale yellow.  Get plenty of rest.  Return to work when your temperature is < 100 for 24 hours or as told by your doctor. You may use a face mask and wash your hands to stop your cold from spreading. GET HELP RIGHT AWAY IF:   After the first few days, you feel you are getting worse.  You have questions about your medicine.  You have chills, shortness of breath, or red spit (mucus).  You have pain in the face for more then 1-2 days, especially when you bend forward.  You have a fever, puffy (swollen) neck, pain when you swallow, or white spots in the back of your throat.  You have a bad headache, ear pain, sinus pain, or chest pain.  You have a high-pitched whistling sound when you breathe in and out (wheezing).  You cough up blood.  You have sore muscles or a stiff  neck. MAKE SURE YOU:   Understand these instructions.  Will watch your condition.  Will get help right away if you are not doing well or get worse. Document Released: 02/22/2008 Document Revised: 11/28/2011 Document Reviewed: 12/11/2013 [facility] Patient Information 2015 [place], [place]. This information is not intended to replace advice given to you by your health care provider. Make sure you discuss any questions you have with your health care provider.

## 2015-12-01 NOTE — Progress Notes (Signed)
HPI:  Acute visit for URI: -started: 3 days ago -symptoms:congestion, cough -denies:fever, body aches, wheezing, SOB, NVD, tooth pain, sinus pain -has tried: flonase -sick contacts/travel/risks: no reported flu, strep or tick exposure - grandson had similar symptoms - he had negative flu test -Hx of: allergies ROS: See pertinent positives and negatives per HPI.  Past Medical History  Diagnosis Date  . CAD (coronary artery disease)   . Myocardial infarction ([place])   . HTN (hypertension)   . Hyperlipidemia   . Obesity   . Diabetes mellitus   . Fatigue   . OSA (obstructive sleep apnea)   . Persistent disorder of initiating or maintaining sleep   . Erectile dysfunction   . Nephrolithiasis   . Diverticulosis   . Hemorrhoids   . Personal history of colonic polyps 02/05/2003  . Kidney stones     Past Surgical History  Procedure Laterality Date  . Coronary stent placement      stenting of the right coronary artery with followup rotational  atherectomy.   . Cholecystectomy    . Coronary angioplasty    . Orthopedic surgery    . Rotator cuff surg      Bil  . Inguinal hernia repair      right  . Foot surgery      right  . Finger surgery      right    Family History  Problem Relation Age of Onset  . Liver cancer Father   . Lung cancer Father   . Cancer Father   . Heart disease Father   . Heart disease Sister   . Lung cancer Mother   . COPD Mother   . Cancer Mother   . Obesity Mother     Social History   Social History  . Marital Status: Married    Spouse Name: N/A  . Number of Children: N/A  . Years of Education: N/A   Social History Main Topics  . Smoking status: Former Smoker -- 1.50 packs/day for 30 years    Types: Cigarettes    Quit date: 09/19/1980  . Smokeless tobacco: Never Used  . Alcohol Use: 0.0 oz/week     Comment: social  . Drug Use: No  . Sexual Activity: Not Asked   Other Topics Concern  . None   Social History Narrative      Current outpatient prescriptions:  .  aspirin 81 MG tablet, Take 81 mg by mouth daily., Disp: , Rfl:  .  fenofibrate 160 MG tablet, Take 160 mg by mouth daily., Disp: , Rfl:  .  fluticasone (FLONASE) 50 MCG/ACT nasal spray, Place 2 sprays into both nostrils daily., Disp: 48 g, Rfl: 1 .  glucagon (GLUCAGON EMERGENCY) 1 MG injection, Inject 1 mg into the muscle once as needed., Disp: 1 each, Rfl: 12 .  Insulin Human (INSULIN PUMP) SOLN, Inject 1 each into the skin continuous. Novolog 1 unit for every 15 carbs, Disp: , Rfl:  .  lisinopril (PRINIVIL,ZESTRIL) 5 MG tablet, Take 5 mg by mouth daily.  , Disp: , Rfl:  .  LORazepam (ATIVAN) 1 MG tablet, Take 1 tablet (1 mg total) by mouth 2 (two) times daily as needed for anxiety., Disp: 180 tablet, Rfl: 1 .  metFORMIN (GLUCOPHAGE) 1000 MG tablet, Take 1,000 mg by mouth 2 (two) times daily with a meal., Disp: , Rfl:  .  Multiple Vitamins-Minerals (CENTRUM SILVER PO), 1 tab po qd , Disp: , Rfl:  .  rosuvastatin (CRESTOR) 20  MG tablet, Take 1 tablet (20 mg total) by mouth daily., Disp: 90 tablet, Rfl: 3 .  sertraline (ZOLOFT) 100 MG tablet, Take 100 mg by mouth daily. Total of 125mg  daily, Disp: , Rfl:  .  sertraline (ZOLOFT) 50 MG tablet, Take 25 mg by mouth daily. Total of 125mg  daily, Disp: , Rfl:  .  vitamin B-12 (CYANOCOBALAMIN) 1000 MCG tablet, Take 1,000 mcg by mouth daily.  , Disp: , Rfl:  .  zolpidem (AMBIEN) 10 MG tablet, Take 10 mg by mouth at bedtime as needed. For insomnia, Disp: , Rfl:  .  benzonatate (TESSALON) 100 MG capsule, Take 1 capsule (100 mg total) by mouth 2 (two) times daily as needed for cough., Disp: 20 capsule, Rfl: 0  EXAM:  Filed Vitals:   12/01/15 1118  BP: 126/74  Pulse: 94  Temp: 98.4 F (36.9 C)    Body mass index is 33 kg/(m^2).  GENERAL: vitals reviewed and listed above, alert, oriented, appears well hydrated and in no acute distress  HEENT: atraumatic, conjunttiva clear, no obvious abnormalities on  inspection of external nose and ears, normal appearance of ear canals and TMs, clear nasal congestion, mild post oropharyngeal erythema with PND, no tonsillar edema or exudate, no sinus TTP  NECK: no obvious masses on inspection  LUNGS: clear to auscultation bilaterally, no wheezes, rales or rhonchi, good air movement  CV: HRRR, no peripheral edema  MS: moves all extremities without noticeable abnormality  PSYCH: pleasant and cooperative, no obvious depression or anxiety  ASSESSMENT AND PLAN:  Discussed the following assessment and plan:  Viral upper respiratory illness  -given HPI and exam findings today, a serious infection or illness is unlikely. We discussed potential etiologies, with VURI being most likely, and advised supportive care and monitoring. We discussed treatment side effects, likely course, antibiotic misuse, transmission, and signs of developing a serious illness.  -of course, we advised to return or notify a doctor immediately if symptoms worsen or persist or new concerns arise.    Patient Instructions  INSTRUCTIONS FOR UPPER RESPIRATORY INFECTION:  - AFRIN nasal spray twice daily for 5 days - but do NOT use longer then 5 days  -can use tylenol as directed for aches and sorethroat  -in the winter time, using a humidifier at night is helpful (please follow cleaning instructions)  -if you are taking a cough medication - use only as directed, may also try a teaspoon of honey to coat the throat and throat lozenges.   -for sore throat, salt water gargles can help  -follow up if you have fevers, facial pain, tooth pain, difficulty breathing or are worsening or symptoms persist longer then expected  Upper Respiratory Infection, Adult An upper respiratory infection (URI) is also known as the common cold. It is often caused by a type of germ (virus). Colds are easily spread (contagious). You can pass it to others by kissing, coughing, sneezing, or drinking out of the  same glass. Usually, you get better in 1 to 3  weeks.  However, the cough can last for even longer. HOME CARE   Only take medicine as told by your doctor. Follow instructions provided above.  Drink enough water and fluids to keep your pee (urine) clear or pale yellow.  Get plenty of rest.  Return to work when your temperature is < 100 for 24 hours or as told by your doctor. You may use a face mask and wash your hands to stop your cold from spreading. GET HELP  RIGHT AWAY IF:   After the first few days, you feel you are getting worse.  You have questions about your medicine.  You have chills, shortness of breath, or red spit (mucus).  You have pain in the face for more then 1-2 days, especially when you bend forward.  You have a fever, puffy (swollen) neck, pain when you swallow, or white spots in the back of your throat.  You have a bad headache, ear pain, sinus pain, or chest pain.  You have a high-pitched whistling sound when you breathe in and out (wheezing).  You cough up blood.  You have sore muscles or a stiff neck. MAKE SURE YOU:   Understand these instructions.  Will watch your condition.  Will get help right away if you are not doing well or get worse. Document Released: 02/22/2008 Document Revised: 11/28/2011 Document Reviewed: 12/11/2013 [facility] Patient Information 2015 [place], [place]. This information is not intended to replace advice given to you by your health care provider. Make sure you discuss any questions you have with your health care provider.       Colin Benton R.

## 2015-12-01 NOTE — Progress Notes (Signed)
Pre visit review using our clinic review tool, if applicable. No additional management support is needed unless otherwise documented below in the visit note. 

## 2015-12-29 DIAGNOSIS — Z794 Long term (current) use of insulin: Secondary | ICD-10-CM | POA: Diagnosis not present

## 2015-12-29 DIAGNOSIS — R197 Diarrhea, unspecified: Secondary | ICD-10-CM | POA: Diagnosis not present

## 2015-12-29 DIAGNOSIS — E119 Type 2 diabetes mellitus without complications: Secondary | ICD-10-CM | POA: Diagnosis not present

## 2015-12-29 DIAGNOSIS — Z87891 Personal history of nicotine dependence: Secondary | ICD-10-CM | POA: Diagnosis not present

## 2015-12-29 DIAGNOSIS — K529 Noninfective gastroenteritis and colitis, unspecified: Secondary | ICD-10-CM | POA: Diagnosis not present

## 2015-12-29 DIAGNOSIS — R112 Nausea with vomiting, unspecified: Secondary | ICD-10-CM | POA: Diagnosis not present

## 2015-12-29 DIAGNOSIS — Z880 Allergy status to penicillin: Secondary | ICD-10-CM | POA: Diagnosis not present

## 2016-01-07 ENCOUNTER — Telehealth: Payer: Self-pay | Admitting: Internal Medicine

## 2016-01-07 NOTE — Telephone Encounter (Signed)
lmtcb X1 for pt with pt's spouse.   

## 2016-01-08 MED ORDER — FLUTICASONE PROPIONATE 50 MCG/ACT NA SUSP: 2.0000 | Freq: Every day | NASAL | Status: DC

## 2016-01-08 NOTE — Telephone Encounter (Signed)
Spoke with pt, requesting 90 day supply of flonase sent to pharmacy.  This has been sent.  Nothing further needed.

## 2016-01-08 NOTE — Telephone Encounter (Signed)
Left message for patient to call back to confirm medication he needs refilled. Awaiting call back from patient.

## 2016-01-08 NOTE — Telephone Encounter (Signed)
Pt returning call  Please call [phone]

## 2016-01-14 ENCOUNTER — Encounter: Payer: Self-pay | Admitting: Internal Medicine

## 2016-01-14 ENCOUNTER — Other Ambulatory Visit (INDEPENDENT_AMBULATORY_CARE_PROVIDER_SITE_OTHER): Payer: Medicare Other | Admitting: *Deleted

## 2016-01-14 ENCOUNTER — Ambulatory Visit (INDEPENDENT_AMBULATORY_CARE_PROVIDER_SITE_OTHER): Payer: Medicare Other | Admitting: Internal Medicine

## 2016-01-14 VITALS — BP 110/64 | HR 85 | Temp 97.6°F | Resp 12 | Wt 236.0 lb

## 2016-01-14 DIAGNOSIS — E1151 Type 2 diabetes mellitus with diabetic peripheral angiopathy without gangrene: Secondary | ICD-10-CM | POA: Diagnosis not present

## 2016-01-14 DIAGNOSIS — R5382 Chronic fatigue, unspecified: Secondary | ICD-10-CM

## 2016-01-14 LAB — TSH: TSH: 2.51 u[IU]/mL (ref 0.35–4.50)

## 2016-01-14 LAB — VITAMIN B12: Vitamin B-12: 607 pg/mL (ref 211–911)

## 2016-01-14 LAB — POCT GLYCOSYLATED HEMOGLOBIN (HGB A1C): Hemoglobin A1C: 6.3

## 2016-01-14 LAB — VITAMIN D 25 HYDROXY (VIT D DEFICIENCY, FRACTURES): VITD: 30.21 ng/mL (ref 30.00–100.00)

## 2016-01-14 NOTE — Patient Instructions (Addendum)
Please change the pump settings as follows: - basal rates :  12 am: 1.45 >> 1.6   6 am: 1.6 >> 1.65   9 pm: 1.6 >> 1.7 - ICR: 3, except 9 pm - 12 am: 4 >> 3.5 - ISF: 15, except from 6 am to 12 am: 20 - target:   12 am: 120   6 am: 110 - IOB: 4h   Continue Metformin 1000 mg 2x a day.  Please come back for a follow-up appointment in 3 months.
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
## 2016-02-17 DIAGNOSIS — E119 Type 2 diabetes mellitus without complications: Secondary | ICD-10-CM | POA: Diagnosis not present

## 2016-02-17 LAB — HM DIABETES EYE EXAM

## 2016-02-23 DIAGNOSIS — H6991 Unspecified Eustachian tube disorder, right ear: Secondary | ICD-10-CM | POA: Diagnosis not present

## 2016-02-26 ENCOUNTER — Encounter: Payer: Self-pay | Admitting: Family Medicine

## 2016-03-04 ENCOUNTER — Encounter: Payer: Self-pay | Admitting: Internal Medicine

## 2016-03-23 ENCOUNTER — Other Ambulatory Visit: Payer: Self-pay | Admitting: Internal Medicine

## 2016-04-15 ENCOUNTER — Ambulatory Visit (INDEPENDENT_AMBULATORY_CARE_PROVIDER_SITE_OTHER): Payer: Medicare Other | Admitting: Internal Medicine

## 2016-04-15 ENCOUNTER — Encounter: Payer: Self-pay | Admitting: Internal Medicine

## 2016-04-15 VITALS — BP 136/66 | HR 80 | Temp 97.9°F | Wt 241.0 lb

## 2016-04-15 DIAGNOSIS — E1151 Type 2 diabetes mellitus with diabetic peripheral angiopathy without gangrene: Secondary | ICD-10-CM

## 2016-04-15 LAB — COMPLETE METABOLIC PANEL WITH GFR
ALT: 29 U/L (ref 9–46)
AST: 27 U/L (ref 10–35)
Albumin: 4.1 g/dL (ref 3.6–5.1)
Alkaline Phosphatase: 89 U/L (ref 40–115)
BUN: 19 mg/dL (ref 7–25)
CALCIUM: 9.6 mg/dL (ref 8.6–10.3)
CHLORIDE: 103 mmol/L (ref 98–110)
CO2: 21 mmol/L (ref 20–31)
Creat: 0.87 mg/dL (ref 0.70–1.18)
GFR, EST NON AFRICAN AMERICAN: 87 mL/min (ref >=60)
GFR, Est African American: >89 mL/min (ref >=60)
GLUCOSE: 169 mg/dL — AB (ref 65–99)
POTASSIUM: 4.5 mmol/L (ref 3.5–5.3)
SODIUM: 136 mmol/L (ref 135–146)
Total Bilirubin: 0.4 mg/dL (ref 0.2–1.2)
Total Protein: 6.6 g/dL (ref 6.1–8.1)

## 2016-04-15 LAB — LIPID PANEL
Cholesterol: 153 mg/dL (ref 0–200)
HDL: 27.4 mg/dL — AB (ref >39.00)
Total CHOL/HDL Ratio: 6

## 2016-04-15 LAB — HEMOGLOBIN A1C: Hgb A1c MFr Bld: 7.4 % — ABNORMAL HIGH (ref 4.6–6.5)

## 2016-04-15 LAB — LDL CHOLESTEROL, DIRECT: LDL DIRECT: 77 mg/dL
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
## 2016-04-15 NOTE — Progress Notes (Signed)
Pre visit review using our clinic review tool, if applicable. No additional management support is needed unless otherwise documented below in the visit note. 

## 2016-04-15 NOTE — Patient Instructions (Addendum)
Pleae continue: - basal rates :  12 am: 1.6   6 am: 1.65   9 pm: 1.7 - ICR: 3, except 9 pm - 12 am: 3.5 - ISF:   6 am - 9 pm: 20, exc. 12 am - 6 am: 15 - target:   12 am: 120   6 am: 110 - IOB: 4h   Please schedule an appt with Brent [name] in 1 week.  .Please return in 1.5 months with your sugar log.

## 2016-04-25 ENCOUNTER — Encounter: Payer: Medicare Other | Admitting: Nutrition

## 2016-05-09 ENCOUNTER — Encounter: Payer: Medicare Other | Attending: Internal Medicine | Admitting: Nutrition

## 2016-05-09 DIAGNOSIS — Z713 Dietary counseling and surveillance: Secondary | ICD-10-CM | POA: Diagnosis not present

## 2016-05-09 DIAGNOSIS — E1151 Type 2 diabetes mellitus with diabetic peripheral angiopathy without gangrene: Secondary | ICD-10-CM | POA: Diagnosis not present

## 2016-05-09 NOTE — Progress Notes (Signed)
Pump download shows average blood sugar is 183.  FBSs: 195-235. HE says he is putting in 70 carbs and only eating 28 for meals to try to get his blood sugars down.  He is putting the blood sugar readings in, but not always giving the correction dose.  He says in doing this, it helps to bring it down better, but still ususally in the 150s-200s He also says when he does just a correction bolus, it does not bring him down at all, and will need to add 1-3 extra units. Discussed the need to change the basal rate, and to decrease the carb ratio.  He agreed and the basal rate was increased by 0 .15u/hr to 12AM: 1.75, 6AM: 1.80, 9PM: 1.85.   I/C ratio changed from 3 to 2.5, and ISF changed from 15 to 12. He will call Wednesday with blood sugar readings.  He will call sooner, if blood sugars drop low

## 2016-05-10 NOTE — Patient Instructions (Signed)
Call on Wednesday with blood sugar readings. Call sooner if readings drop below 80

## 2016-05-25 ENCOUNTER — Telehealth: Payer: Self-pay | Admitting: Nutrition

## 2016-05-25 NOTE — Telephone Encounter (Signed)
Pb appears to be the basal rates at night >> we can increase these for now.

## 2016-05-25 NOTE — Telephone Encounter (Signed)
Patient phoned in blood sugar readings.  He is putting the exact amount of carbs into the pump before each meal and his blood sugar readings as well.  Pt. Denies eating after supper or between meals.  Date             acB      2hr.pcB    acL     2hr. pcL       acS    . 2hrpcS           HS  05/16/16        233        192         177       181                                                147 05/17/16        220        218                                         178          144              05/18/16        170        192          201                          170          178 05/19/16        210

## 2016-05-31 NOTE — Telephone Encounter (Signed)
Basal rate increased from MN to 6AM from 1.75 to 1.85u/hr.  He will call me next week if FBSs are still high

## 2016-06-02 ENCOUNTER — Ambulatory Visit: Payer: Medicare Other | Admitting: Internal Medicine

## 2016-06-20 DIAGNOSIS — N289 Disorder of kidney and ureter, unspecified: Secondary | ICD-10-CM

## 2016-06-20 DIAGNOSIS — N481 Balanitis: Secondary | ICD-10-CM | POA: Diagnosis not present

## 2016-06-20 DIAGNOSIS — N200 Calculus of kidney: Secondary | ICD-10-CM | POA: Diagnosis not present

## 2016-06-20 DIAGNOSIS — N3941 Urge incontinence: Secondary | ICD-10-CM | POA: Diagnosis not present

## 2016-06-20 DIAGNOSIS — N401 Enlarged prostate with lower urinary tract symptoms: Secondary | ICD-10-CM | POA: Diagnosis not present

## 2016-06-20 DIAGNOSIS — E291 Testicular hypofunction: Secondary | ICD-10-CM | POA: Diagnosis not present

## 2016-06-20 DIAGNOSIS — N281 Cyst of kidney, acquired: Secondary | ICD-10-CM | POA: Diagnosis not present

## 2016-06-20 HISTORY — DX: Disorder of kidney and ureter, unspecified: N28.9

## 2016-07-01 ENCOUNTER — Encounter: Payer: Self-pay | Admitting: Gastroenterology

## 2016-07-26 ENCOUNTER — Encounter: Payer: Self-pay | Admitting: Internal Medicine

## 2016-07-26 ENCOUNTER — Ambulatory Visit (INDEPENDENT_AMBULATORY_CARE_PROVIDER_SITE_OTHER): Payer: Medicare Other | Admitting: Internal Medicine

## 2016-07-26 VITALS — BP 134/84 | HR 92 | Ht 70.5 in | Wt 245.0 lb

## 2016-07-26 DIAGNOSIS — E1151 Type 2 diabetes mellitus with diabetic peripheral angiopathy without gangrene: Secondary | ICD-10-CM | POA: Diagnosis not present

## 2016-07-26 LAB — POCT GLYCOSYLATED HEMOGLOBIN (HGB A1C): Hemoglobin A1C: 6.7
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
## 2016-07-26 NOTE — Patient Instructions (Signed)
Please continue: - basal rates :  12 am: 1.85 3 am: 1.85 >> 1.9   6 am: 1.8 >> 1.85   9 pm: 1.85 - ICR: 2.5 - ISF:   6 am - 9 pm: 15, exc. 12 am - 6 am: 12 - target:   12 am: 120   6 am: 110 - IOB: 4h   Metformin 1000 mg 2x a day.  Please restart exercise.  Please return in 3 months with your sugar log.

## 2016-07-29 ENCOUNTER — Ambulatory Visit (INDEPENDENT_AMBULATORY_CARE_PROVIDER_SITE_OTHER): Payer: Medicare Other

## 2016-07-29 DIAGNOSIS — Z23 Encounter for immunization: Secondary | ICD-10-CM | POA: Diagnosis not present

## 2016-08-08 ENCOUNTER — Ambulatory Visit (INDEPENDENT_AMBULATORY_CARE_PROVIDER_SITE_OTHER): Payer: Medicare Other | Admitting: Internal Medicine

## 2016-08-08 ENCOUNTER — Encounter: Payer: Self-pay | Admitting: Internal Medicine

## 2016-08-08 VITALS — BP 146/70 | HR 85 | Ht 72.0 in | Wt 247.6 lb

## 2016-08-08 DIAGNOSIS — G4733 Obstructive sleep apnea (adult) (pediatric): Secondary | ICD-10-CM | POA: Diagnosis not present

## 2016-08-08 NOTE — Progress Notes (Signed)
07/31/14- Dr Gwenette Greet The patient comes in today for follow-up of his obstructive sleep apnea. He is wearing C Pap compliantly, and is having no issues with his mask fit or pressure. He feels that he sleeps adequately, and has no daytime sleepiness. His weight has gone up 9 pounds since last visit, and I've encouraged him to work on this.  08/07/2015-70 year old male former smoker followed for obstructive sleep apnea, insomnia complicated by CAD/MI/stent, DM FOLLOWS FOR: DME is [facility]-Wears CPAP every night for aobut 7 hours;No recent DL. He is not sure about his pressure but is comfortable with fullface mask. Told he rarely snores through CPAP. Takes Ambien 10 mg most nights to help with chronic insomnia. Sleep is disturbed by somatic pains including old ankle fracture.  08/08/2016-70 year old male former smoker followed for OSA, insomnia, complicated by CAD/MI/stent, DM CPAP 17 [facility] FOLLOWS FOR: DME [facility]; Pt wears CPAP every night for at least 6-7 hours; DL attached.No new supplies needed. Download 86% 4 hour compliance with CPAP 17, AHI 0.4/hour. He notes sleepiness in the morning after he's been up for a while. No naps usually. 3 cups of coffee. He uses Ambien 10 mg at bedtime often-discussed.  ROS-see HPI   Negative unless "+" Constitutional:    weight loss, night sweats, fevers, chills, + fatigue, lassitude. HEENT:    headaches, difficulty swallowing, tooth/dental problems, sore throat,       sneezing, itching, ear ache, nasal congestion, post nasal drip, snoring CV:    chest pain, orthopnea, PND, swelling in lower extremities, anasarca,                                                       dizziness, palpitations Resp:   shortness of breath with exertion or at rest.                productive cough,   non-productive cough, coughing up of blood.              change in color of mucus.  wheezing.   Skin:    rash or lesions. GI:  No-   heartburn, indigestion, abdominal pain, nausea,  vomiting,  GU:  MS:   joint pain, stiffness,  Neuro-     nothing unusual Psych:  change in mood or affect.  depression or anxiety.   memory loss.  OBJ- Physical Exam General- Alert, Oriented, Affect-appropriate, Distress- none acute Skin- rash-none, lesions- none, excoriation- none Lymphadenopathy- none Head- atraumatic            Eyes- Gross vision intact, PERRLA, conjunctivae and secretions clear            Ears- Hearing, canals-normal            Nose- Clear, no-Septal dev, mucus, polyps, erosion, perforation             Throat- Mallampati II-III , mucosa clear , drainage- none, tonsils- atrophic Neck- flexible , trachea midline, no stridor , thyroid nl, carotid no bruit Chest - symmetrical excursion , unlabored           Heart/CV- RRR , no murmur , no gallop  , no rub, nl s1 s2                           - JVD- none ,  edema- none, stasis changes- none, varices- none           Lung- clear to P&A, wheeze- none, cough + dry , dullness-none, rub- none           Chest wall-  Abd-  Br/ Gen/ Rectal- Not done, not indicated Extrem- cyanosis- none, clubbing, none, atrophy- none, strength- nl Neuro- grossly intact to observation

## 2016-08-08 NOTE — Patient Instructions (Signed)
Order- DME [facility]- please evaluate eligibility for replacement of old CPAP machine, continue mask of choice, 17 cwp, humidifier, supplies, AirView    Dx  OSA   Please call as needed

## 2016-08-09 ENCOUNTER — Telehealth: Payer: Self-pay | Admitting: Internal Medicine

## 2016-08-10 NOTE — Telephone Encounter (Signed)
Per Eliezer Lofts with [facility], pt is not eligable for new cpap until dec 2018. Pt current machine is not 72 years old.  Nothing further needed at this time  Will route to CY for a FYI.

## 2016-08-17 NOTE — Assessment & Plan Note (Signed)
Download reviewed with him. Satisfactory compliance and excellent control. He thinks his machine is old and needing repair. We discussed eligibility for replacement. Current pressure seems good.

## 2016-09-07 ENCOUNTER — Telehealth: Payer: Self-pay | Admitting: Internal Medicine

## 2016-09-07 NOTE — Telephone Encounter (Signed)
Pt would like to have Hep C screening may I have a order for this?

## 2016-09-08 NOTE — Telephone Encounter (Signed)
Left message on voicemail to call office. Pt needs to wait till he has his physical for Hep C to be done with his physical labs due to Medicare.

## 2016-10-04 DIAGNOSIS — Z85828 Personal history of other malignant neoplasm of skin: Secondary | ICD-10-CM | POA: Diagnosis not present

## 2016-10-04 DIAGNOSIS — D1801 Hemangioma of skin and subcutaneous tissue: Secondary | ICD-10-CM | POA: Diagnosis not present

## 2016-10-04 DIAGNOSIS — L814 Other melanin hyperpigmentation: Secondary | ICD-10-CM | POA: Diagnosis not present

## 2016-10-04 DIAGNOSIS — L821 Other seborrheic keratosis: Secondary | ICD-10-CM | POA: Diagnosis not present

## 2016-10-04 DIAGNOSIS — L400 Psoriasis vulgaris: Secondary | ICD-10-CM | POA: Diagnosis not present

## 2016-10-04 DIAGNOSIS — L570 Actinic keratosis: Secondary | ICD-10-CM | POA: Diagnosis not present

## 2016-10-17 DIAGNOSIS — M9903 Segmental and somatic dysfunction of lumbar region: Secondary | ICD-10-CM | POA: Diagnosis not present

## 2016-10-17 DIAGNOSIS — M5136 Other intervertebral disc degeneration, lumbar region: Secondary | ICD-10-CM | POA: Diagnosis not present

## 2016-10-19 DIAGNOSIS — M9903 Segmental and somatic dysfunction of lumbar region: Secondary | ICD-10-CM | POA: Diagnosis not present

## 2016-10-19 DIAGNOSIS — M5136 Other intervertebral disc degeneration, lumbar region: Secondary | ICD-10-CM | POA: Diagnosis not present

## 2016-10-20 DIAGNOSIS — M9903 Segmental and somatic dysfunction of lumbar region: Secondary | ICD-10-CM | POA: Diagnosis not present

## 2016-10-20 DIAGNOSIS — M5136 Other intervertebral disc degeneration, lumbar region: Secondary | ICD-10-CM | POA: Diagnosis not present

## 2016-10-24 DIAGNOSIS — M5136 Other intervertebral disc degeneration, lumbar region: Secondary | ICD-10-CM | POA: Diagnosis not present

## 2016-10-24 DIAGNOSIS — M9903 Segmental and somatic dysfunction of lumbar region: Secondary | ICD-10-CM | POA: Diagnosis not present

## 2016-10-26 DIAGNOSIS — M9903 Segmental and somatic dysfunction of lumbar region: Secondary | ICD-10-CM | POA: Diagnosis not present

## 2016-10-26 DIAGNOSIS — M5136 Other intervertebral disc degeneration, lumbar region: Secondary | ICD-10-CM | POA: Diagnosis not present

## 2016-10-27 ENCOUNTER — Ambulatory Visit: Payer: Medicare Other | Admitting: Internal Medicine

## 2016-10-31 DIAGNOSIS — M9903 Segmental and somatic dysfunction of lumbar region: Secondary | ICD-10-CM | POA: Diagnosis not present

## 2016-10-31 DIAGNOSIS — M5136 Other intervertebral disc degeneration, lumbar region: Secondary | ICD-10-CM | POA: Diagnosis not present

## 2016-11-01 ENCOUNTER — Ambulatory Visit (INDEPENDENT_AMBULATORY_CARE_PROVIDER_SITE_OTHER): Payer: Medicare Other | Admitting: Internal Medicine

## 2016-11-01 ENCOUNTER — Encounter: Payer: Self-pay | Admitting: Internal Medicine

## 2016-11-01 VITALS — BP 134/80 | HR 81 | Ht 70.5 in | Wt 244.0 lb

## 2016-11-01 DIAGNOSIS — E1151 Type 2 diabetes mellitus with diabetic peripheral angiopathy without gangrene: Secondary | ICD-10-CM

## 2016-11-01 LAB — POCT GLYCOSYLATED HEMOGLOBIN (HGB A1C): Hemoglobin A1C: 7
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
## 2016-11-01 NOTE — Patient Instructions (Addendum)
Please change: - basal rates :  12 am: 1.85 3 am: 1.85 >> 1.9   6 am: 1.8 >> 1.85   9 pm: 1.85 - ICR: 2.5 - ISF:   6 am - 9 pm: 15, exc. 12 am - 6 am: 12 - target:   12 am: 120   6 am: 110 - IOB: 4h   Metformin 1000 mg 2x a day.  Please return in 1.5 months with your sugar log.

## 2016-11-07 DIAGNOSIS — M9903 Segmental and somatic dysfunction of lumbar region: Secondary | ICD-10-CM | POA: Diagnosis not present

## 2016-11-07 DIAGNOSIS — M5136 Other intervertebral disc degeneration, lumbar region: Secondary | ICD-10-CM | POA: Diagnosis not present

## 2016-11-08 ENCOUNTER — Telehealth: Payer: Self-pay | Admitting: Internal Medicine

## 2016-11-08 DIAGNOSIS — Z1159 Encounter for screening for other viral diseases: Secondary | ICD-10-CM

## 2016-11-08 NOTE — Telephone Encounter (Signed)
Pt would like to have a HepC test done may I have an order?

## 2016-11-09 NOTE — Telephone Encounter (Signed)
See message below, Is it ok for me to order Hep c lab draw. Please advise

## 2016-11-14 ENCOUNTER — Other Ambulatory Visit: Payer: Self-pay | Admitting: Internal Medicine

## 2016-11-14 DIAGNOSIS — Z1159 Encounter for screening for other viral diseases: Secondary | ICD-10-CM

## 2016-11-14 DIAGNOSIS — M9903 Segmental and somatic dysfunction of lumbar region: Secondary | ICD-10-CM | POA: Diagnosis not present

## 2016-11-14 DIAGNOSIS — M5136 Other intervertebral disc degeneration, lumbar region: Secondary | ICD-10-CM | POA: Diagnosis not present

## 2016-11-14 NOTE — Telephone Encounter (Signed)
lmom for pt to call and schedule lab appointment.

## 2016-11-16 ENCOUNTER — Other Ambulatory Visit (INDEPENDENT_AMBULATORY_CARE_PROVIDER_SITE_OTHER): Payer: Medicare Other

## 2016-11-16 DIAGNOSIS — Z1159 Encounter for screening for other viral diseases: Secondary | ICD-10-CM

## 2016-11-17 LAB — HEPATITIS C ANTIBODY: HCV Ab: NEGATIVE

## 2016-11-22 DIAGNOSIS — M5136 Other intervertebral disc degeneration, lumbar region: Secondary | ICD-10-CM | POA: Diagnosis not present

## 2016-11-22 DIAGNOSIS — M9903 Segmental and somatic dysfunction of lumbar region: Secondary | ICD-10-CM | POA: Diagnosis not present

## 2016-11-22 NOTE — Telephone Encounter (Signed)
Okay for order for hep C antibody

## 2016-11-23 ENCOUNTER — Encounter: Payer: Self-pay | Admitting: Gastroenterology

## 2016-11-24 NOTE — Telephone Encounter (Signed)
Spoke with pt and he stated that he had already had labs drown and his results were negative.

## 2016-11-24 NOTE — Addendum Note (Signed)
Addended by: Abelardo Diesel on: 11/24/2016 09:14 AM   Modules accepted: Orders

## 2016-11-30 ENCOUNTER — Ambulatory Visit (INDEPENDENT_AMBULATORY_CARE_PROVIDER_SITE_OTHER): Payer: Medicare Other

## 2016-11-30 ENCOUNTER — Encounter (INDEPENDENT_AMBULATORY_CARE_PROVIDER_SITE_OTHER): Payer: Self-pay | Admitting: Physical Medicine and Rehabilitation

## 2016-11-30 ENCOUNTER — Ambulatory Visit (INDEPENDENT_AMBULATORY_CARE_PROVIDER_SITE_OTHER): Payer: Medicare Other | Admitting: Physical Medicine and Rehabilitation

## 2016-11-30 VITALS — BP 152/71 | HR 76

## 2016-11-30 DIAGNOSIS — G8929 Other chronic pain: Secondary | ICD-10-CM | POA: Diagnosis not present

## 2016-11-30 DIAGNOSIS — M545 Low back pain: Secondary | ICD-10-CM

## 2016-11-30 DIAGNOSIS — M25551 Pain in right hip: Secondary | ICD-10-CM

## 2016-11-30 DIAGNOSIS — M5416 Radiculopathy, lumbar region: Secondary | ICD-10-CM

## 2016-11-30 MED ORDER — IBUPROFEN 800 MG PO TABS: 800.0000 mg | ORAL_TABLET | Freq: Three times a day (TID) | ORAL | 0 refills | Status: DC

## 2016-11-30 NOTE — Progress Notes (Signed)
Brent [name]. - 71 y.o. male MRN [id number]  Date of birth: Dec 06, 1945  Office Visit Note: Visit Date: 11/30/2016 PCP: Brent Cowden, MD Referred by: Brent Lor, MD  Subjective: Chief Complaint  Patient presents with  . Lower Back - Pain  . Right Hip - Pain   HPI: Brent [name] is a very pleasant 71 year old gentleman seen for the first time in our office referred to [place] through a mutual patient of Brent [name]. He reports chronic stiffness of low back pain for many years off and on that he is just lived with without too much trouble. However he reports increasing worsening severe pain for about 73 weeks-7 or 8 on pain scale particularly on the right side referring into the buttock and posterior lateral area around the greater trochanter and hamstring. Right now the pain is at a 3- has been resting and that has helped. He reports a feeling of paresthesia on occasion that is more posterior lateral hamstring.. The hip pain is present when he gets up in the morning and gets better after moving around some. The hip pain is lateral and posterior. The back pain increases with activities like vacuuming. Center of lower back is sore and stiff. He does not endorse any specific injury 3 weeks ago. He has been taking ibuprofen 600 mg up to 2 times a day but really as needed. He has had no prior back surgery or other interventions. He has had chiropractic care through Brent [name] in [place]. He went to 6-8 sessions of this without much relief. He was given some exercises as well as tried to do some of those. He really tells me he's cut down on some activities and rested and that seems to help to some degree. He is a type II diabetic but is well controlled. His hemoglobin A1c's are below 7. He does report a history of polyneuropathy in the feet. He has not had any imaging or MRI imaging of the lumbar spine. We did obtain x-rays of the lumbar spine and pelvis today and is a reviewed below.      Review of Systems  Constitutional: Negative for chills, fever, malaise/fatigue and weight loss.  HENT: Negative for hearing loss and sinus pain.   Eyes: Negative for blurred vision, double vision and photophobia.  Respiratory: Negative for cough and shortness of breath.   Cardiovascular: Negative for chest pain, palpitations and leg swelling.  Gastrointestinal: Negative for abdominal pain, nausea and vomiting.  Genitourinary: Negative for flank pain.  Musculoskeletal: Positive for back pain and joint pain. Negative for myalgias.  Skin: Negative for itching and rash.  Neurological: Negative for tremors, focal weakness and weakness.  Endo/Heme/Allergies: Negative.   Psychiatric/Behavioral: Negative for depression.  All other systems reviewed and are negative.  Otherwise per HPI.  Assessment & Plan: Visit Diagnoses:  1. Chronic right-sided low back pain, with sciatica presence unspecified   2. Pain in right hip   3. Lumbar radiculopathy     Plan: Findings:  Chronic history of axial low back pain with stiffness which is most likely facet mediated low back pain and somewhat mechanical. Over the last 3 weeks she's had acute onset of right low back and buttock and hamstring pain that he refers to as hip pain. I feel like this is multifactorial and seems to be most consistent with facet mediated pain on exam but could be related to small disc protrusion. Nonetheless, he has improved a little bit over the last 3 weeks. I  want to stay conservative and have him take ibuprofen 800 mg 3 times a day just for the next couple of weeks with food. I don't think this would be any problem for him even with the diabetes. I also want to continue with his home exercises and to increase his activity as tolerated just to see how he does. I'll see him back in 4 weeks. At that point we would decide depending on how he is doing whether there is a need for interventional facet joint block or similar. We can also look  at potentially MRI imaging if his symptoms worsened. He has no red flag complaints at this point. I spent more than 30 minutes speaking face-to-face with the patient with 50% of the time in counseling.    Meds & Orders:  Meds ordered this encounter  Medications  . ibuprofen (ADVIL,MOTRIN) 800 MG tablet    Sig: Take 1 tablet (800 mg total) by mouth 3 (three) times daily with meals.    Dispense:  90 tablet    Refill:  0    Orders Placed This Encounter  Procedures  . XR Lumbar Spine Complete  . XR Pelvis 1-2 Views    Follow-up: Return in about 4 weeks (around 12/28/2016).   Procedures: No procedures performed  No notes on file   Clinical History: No specialty comments available.  He reports that he quit smoking about 36 years ago. His smoking use included Cigarettes. He has a 45.00 pack-year smoking history. He has never used smokeless tobacco.   Recent Labs  04/15/16 1133 07/26/16 1127 11/01/16 1353  HGBA1C 7.4* 6.7 7.0    Objective:  VS:  HT:    WT:   BMI:     BP:(!) 152/71  HR:76bpm  TEMP: ( )  RESP:  Physical Exam  Constitutional: He is oriented to person, place, and time. He appears well-developed and well-nourished. No distress.  HENT:  Head: Normocephalic and atraumatic.  Nose: Nose normal.  Mouth/Throat: Oropharynx is clear and moist.  Eyes: Conjunctivae are normal. Pupils are equal, round, and reactive to light.  Neck: Normal range of motion. Neck supple.  Cardiovascular: Regular rhythm and intact distal pulses.   Pulmonary/Chest: Effort normal and breath sounds normal.  Abdominal: Soft. He exhibits no distension.  Musculoskeletal: He exhibits no deformity.  Patient is very slow to rise from a seated position with catching sensation. He has concordant low back pain with extension rotation particularly rotation to the right. He has some tenderness over the right greater trochanter but not left. He has no groin pain with hip rotation although is very stiff. He  actually has some pain with external rotation he has a somewhat positive right Patrick's test. He has a negative forward finger test. He has good distal strength without clonus.  Neurological: He is alert and oriented to person, place, and time. He exhibits normal muscle tone. Coordination normal.  Skin: Skin is warm. No rash noted.  Psychiatric: He has a normal mood and affect. His behavior is normal.  Nursing note and vitals reviewed.   Ortho Exam Imaging: Xr Lumbar Spine Complete  Result Date: 12/01/2016 4 view lumbar spine films show mild rightward scoliosis centered at about L3 with degenerative disc changes at L4-S3 and L3-for with some anterior spurring. There is facet arthropathy throughout the lower L4-5 and L5-S1 facet joints without listhesis. Sacroiliac joints look well maintained. Hip heights are equal. Upper part of both hips show some mild degenerative change medially. There is no  fractures or dislocations. There are no pars defects.  Xr Pelvis 1-2 Views  Result Date: 12/01/2016 AP of the pelvis shows well-maintained sacroiliac joints with equal pelvic height and equal hip height. There is no fractures or dislocations. Hips looked likely have good joint spacing except medially there is some degenerative changes. There is some mild spurring.   Past Medical/Family/Surgical/Social History: Medications & Allergies reviewed per EMR Patient Active Problem List   Diagnosis Date Noted  . Insomnia 08/07/2015  . Benign neoplasm of colon 08/29/2011  . DEPRESSION, SITUATIONAL, ACUTE 01/23/2010  . Essential hypertension 05/30/2009  . Coronary atherosclerosis 05/30/2009  . Obstructive sleep apnea 11/28/2008  . OBESITY 09/23/2008  . ERECTILE DYSFUNCTION 11/26/2007  . Well controlled type 2 diabetes mellitus with peripheral circulatory disorder ([place]) 05/09/2007  . Hyperlipidemia 05/09/2007  . MYOCARDIAL INFARCTION, HX OF 05/09/2007  . DIVERTICULOSIS, COLON 05/09/2007   Past Medical  History:  Diagnosis Date  . CAD (coronary artery disease)   . Diabetes mellitus   . Diverticulosis   . Erectile dysfunction   . Fatigue   . Hemorrhoids   . HTN (hypertension)   . Hyperlipidemia   . Kidney stones   . Myocardial infarction   . Nephrolithiasis   . Obesity   . OSA (obstructive sleep apnea)   . Persistent disorder of initiating or maintaining sleep   . Personal history of colonic polyps 02/05/2003   Family History  Problem Relation Age of Onset  . Liver cancer Father   . Lung cancer Father   . Heart disease Father   . Heart disease Sister   . Lung cancer Mother   . COPD Mother   . Obesity Mother    Past Surgical History:  Procedure Laterality Date  . CHOLECYSTECTOMY    . CORONARY ANGIOPLASTY    . CORONARY STENT PLACEMENT     stenting of the right coronary artery with followup rotational  atherectomy.   Marland Kitchen FINGER SURGERY     right  . FOOT SURGERY     right  . INGUINAL HERNIA REPAIR     right  . ORTHOPEDIC SURGERY    . rotator cuff surg     Bil   Social History   Occupational History  . Not on file.   Social History Main Topics  . Smoking status: Former Smoker    Packs/day: 1.50    Years: 30.00    Types: Cigarettes    Quit date: 09/19/1980  . Smokeless tobacco: Never Used  . Alcohol use 0.0 oz/week     Comment: social  . Drug use: No  . Sexual activity: Not on file

## 2016-12-01 DIAGNOSIS — M5136 Other intervertebral disc degeneration, lumbar region: Secondary | ICD-10-CM | POA: Diagnosis not present

## 2016-12-01 DIAGNOSIS — M9903 Segmental and somatic dysfunction of lumbar region: Secondary | ICD-10-CM | POA: Diagnosis not present

## 2016-12-08 DIAGNOSIS — M9903 Segmental and somatic dysfunction of lumbar region: Secondary | ICD-10-CM | POA: Diagnosis not present

## 2016-12-08 DIAGNOSIS — M5136 Other intervertebral disc degeneration, lumbar region: Secondary | ICD-10-CM | POA: Diagnosis not present

## 2016-12-13 ENCOUNTER — Ambulatory Visit: Payer: Medicare Other | Admitting: Internal Medicine

## 2016-12-28 ENCOUNTER — Ambulatory Visit (INDEPENDENT_AMBULATORY_CARE_PROVIDER_SITE_OTHER): Payer: Medicare Other | Admitting: Physical Medicine and Rehabilitation

## 2017-01-10 ENCOUNTER — Other Ambulatory Visit (INDEPENDENT_AMBULATORY_CARE_PROVIDER_SITE_OTHER): Payer: Self-pay | Admitting: Physical Medicine and Rehabilitation

## 2017-01-10 DIAGNOSIS — M25551 Pain in right hip: Secondary | ICD-10-CM

## 2017-01-10 DIAGNOSIS — G8929 Other chronic pain: Secondary | ICD-10-CM

## 2017-01-10 DIAGNOSIS — M545 Low back pain: Principal | ICD-10-CM

## 2017-01-10 DIAGNOSIS — M5416 Radiculopathy, lumbar region: Secondary | ICD-10-CM

## 2017-01-10 NOTE — Telephone Encounter (Signed)
Please advise 

## 2017-01-11 ENCOUNTER — Ambulatory Visit (AMBULATORY_SURGERY_CENTER): Payer: Self-pay

## 2017-01-11 ENCOUNTER — Telehealth: Payer: Self-pay

## 2017-01-11 VITALS — Ht 72.0 in | Wt 245.0 lb

## 2017-01-11 DIAGNOSIS — Z860100 Personal history of colon polyps, unspecified: Secondary | ICD-10-CM

## 2017-01-11 MED ORDER — SUPREP BOWEL PREP KIT 17.5-3.13-1.6 GM/177ML PO SOLN: 1.0000 | Freq: Once | ORAL | 0 refills | Status: AC

## 2017-01-11 NOTE — Telephone Encounter (Signed)
Brent [name],      PLEASE CONTACT CRISTINA GHERGHE FOR INSULIN PUMP INSTRUCTIONS PRIOR TO COLONOSCOPY. THANKS, [name]/PV

## 2017-01-11 NOTE — Telephone Encounter (Signed)
If he is still hurting and needing to take this medication we need to see him in OV or he needs MRI of Lspine and then OV, see last note. We wanted to see him 4 weeks

## 2017-01-11 NOTE — Progress Notes (Signed)
No allergies to eggs or soy No home oxygen No diet meds No past problems with anesthesia  Registered for emmi

## 2017-01-11 NOTE — Telephone Encounter (Signed)
Scheduled for an OV to discuss.

## 2017-01-12 ENCOUNTER — Other Ambulatory Visit: Payer: Self-pay

## 2017-01-12 NOTE — Telephone Encounter (Signed)
Letter sent to Dr Cruzita Lederer for instructions.

## 2017-01-13 NOTE — Telephone Encounter (Signed)
Philemon Kingdom, MD  Magdalene River V, CMA        He can continue the current insulin basal rates, just not take insulin boluses until he eats full meals.  Sincerely,  Philemon Kingdom MD    Pt has been notified and aware, he states clear understanding. Advised to call Dr Pablo Ledger for any follow up questions.

## 2017-01-24 ENCOUNTER — Telehealth (INDEPENDENT_AMBULATORY_CARE_PROVIDER_SITE_OTHER): Payer: Self-pay | Admitting: Physical Medicine and Rehabilitation

## 2017-01-24 ENCOUNTER — Encounter: Payer: Self-pay | Admitting: Gastroenterology

## 2017-01-24 ENCOUNTER — Ambulatory Visit (INDEPENDENT_AMBULATORY_CARE_PROVIDER_SITE_OTHER): Payer: Medicare Other | Admitting: Physical Medicine and Rehabilitation

## 2017-01-24 ENCOUNTER — Encounter (INDEPENDENT_AMBULATORY_CARE_PROVIDER_SITE_OTHER): Payer: Self-pay | Admitting: Physical Medicine and Rehabilitation

## 2017-01-24 VITALS — BP 154/76 | HR 65

## 2017-01-24 DIAGNOSIS — M25551 Pain in right hip: Secondary | ICD-10-CM | POA: Diagnosis not present

## 2017-01-24 DIAGNOSIS — M47816 Spondylosis without myelopathy or radiculopathy, lumbar region: Secondary | ICD-10-CM | POA: Diagnosis not present

## 2017-01-24 DIAGNOSIS — M545 Low back pain: Secondary | ICD-10-CM | POA: Diagnosis not present

## 2017-01-24 DIAGNOSIS — G8929 Other chronic pain: Secondary | ICD-10-CM

## 2017-01-24 DIAGNOSIS — M19049 Primary osteoarthritis, unspecified hand: Secondary | ICD-10-CM

## 2017-01-24 DIAGNOSIS — M5416 Radiculopathy, lumbar region: Secondary | ICD-10-CM | POA: Diagnosis not present

## 2017-01-24 MED ORDER — IBUPROFEN 800 MG PO TABS: 800.0000 mg | ORAL_TABLET | Freq: Three times a day (TID) | ORAL | 0 refills | Status: DC

## 2017-01-24 NOTE — Progress Notes (Signed)
Brent [name]. - 71 y.o. male MRN [id number]  Date of birth: [birth year]  Office Visit Note: Visit Date: 01/24/2017 PCP: Marletta Lor, MD Referred by: Marletta Lor, MD  Subjective: Chief Complaint  Patient presents with  . Lower Back - Pain   HPI: Brent [name] is a very pleasant 71 year old gentleman that I saw initially in March of this year with chronic worsening severe right low back pain and some referral to the hip. X-rays revealed some degenerative disc changes as well as facet arthropathy. We felt like his pain was probably facet joint mediated low back pain. At the time he had been doing some better while taking ibuprofen. He is taking 600 mg up to 2 times a day. His history is complicated by diabetes, chronic kidney disease, hypertension and coronary artery disease. These make it problematic to keep him on a nonsteroidal anti-inflammatory. Because of the time I had seen him and only been going on for 3-4 weeks we decided to maintain him on ibuprofen for 2-3 weeks more to see if that would just calm things down and hopefully would not need any of the other medications. We contemplated at the time in interventional procedures such as facet joint block. We also thought about MRI of the lumbar spine although it was not warranted at the time. Nonetheless he left it was actually doing quite well while taking the ibuprofen. He is taking 800 mg 3 times a day at my direction and we are going to do that for a few weeks. He called in to have it refilled and I want to see him for an office visit because obviously would just want to stay on that medication. He reports as long as he states taking a does okay. He says now his pain levels back to where it was before. He also has a lot of pain in the bilateral hands. He says since he is quit taking the ibuprofen his hands are hurting fairly severely. He does have osteophyte is in the hands bilaterally. He has no history of rheumatologic  condition. He has not seen a rheumatologist in the past. He denies any radicular complaints or new trauma. No real changes other than worsening symptoms.    Review of Systems  Constitutional: Negative for chills, fever, malaise/fatigue and weight loss.  HENT: Negative for hearing loss and sinus pain.   Eyes: Negative for blurred vision, double vision and photophobia.  Respiratory: Negative for cough and shortness of breath.   Cardiovascular: Negative for chest pain, palpitations and leg swelling.  Gastrointestinal: Negative for abdominal pain, nausea and vomiting.  Genitourinary: Negative for flank pain.  Musculoskeletal: Positive for back pain and joint pain. Negative for myalgias.  Skin: Negative for itching and rash.  Neurological: Negative for tremors, focal weakness and weakness.  Endo/Heme/Allergies: Negative.   Psychiatric/Behavioral: Negative for depression.  All other systems reviewed and are negative.  Otherwise per HPI.  Assessment & Plan: Visit Diagnoses:  1. Spondylosis without myelopathy or radiculopathy, lumbar region   2. Hand arthritis   3. Chronic right-sided low back pain, with sciatica presence unspecified   4. Pain in right hip   5. Lumbar radiculopathy     Plan: Findings:  Osteoarthritis of the lumbar spine spondylosis particularly at L4-5 and L5-S1. Pain is mostly right-sided worse with extension in standing. I do feel like this is facet joint mediated pain. He does well on anti-inflammatory but he just cannot take those long-term given his medical conditions  of hypertension and coronary artery disease and diabetes and kidney disease. The next step is diagnostic facet joint block on the right at L4-5 and L5-S1. This would be diagnostic of a therapeutic. Depending on the relief we can ultimately look at radiofrequency ablation which I think would help him greatly it would not require anti-inflammatory. However he is having this hand pain bilaterally. He has  significant Heberden and Bouchard's nodes as well as some pain in the [facility] joints. He is no synovitis. I think this is osteoarthritis of the hands and is really bothering him now that he's been off the anti-inflammatory. It may be worth getting him in to see a rheumatologist to see if there is anything he can take other than anti-inflammatory. We discussed the use of something like tramadol as well as potentially Tumeric or glucosamine. For right now moderate make any medication changes. I would let him take the 800 mg of ibuprofen for another week until we can get the injection completed. We are going to ask for approval of a right L4-5 and L5-S1 facet joint block diagnostically. Depending on the relief he gets we will modify his medications or maybe seek referral to rheumatologist. I spent more than 25 minutes speaking face-to-face with the patient with 50% of the time in counseling.    Meds & Orders:  Meds ordered this encounter  Medications  . ibuprofen (ADVIL,MOTRIN) 800 MG tablet    Sig: Take 1 tablet (800 mg total) by mouth 3 (three) times daily with meals.    Dispense:  90 tablet    Refill:  0   No orders of the defined types were placed in this encounter.   Follow-up: Return for Right L4-5 and L5-S1 facet joint block.   Procedures: No procedures performed  No notes on file   Clinical History: No specialty comments available.  He reports that he quit smoking about 36 years ago. His smoking use included Cigarettes. He has a 45.00 pack-year smoking history. He has never used smokeless tobacco.   Recent Labs  04/15/16 1133 07/26/16 1127 11/01/16 1353  HGBA1C 7.4* 6.7 7.0    Objective:  VS:  HT:    WT:   BMI:     BP:(!) 154/76  HR:65bpm  TEMP: ( )  RESP:  Physical Exam  Constitutional: He is oriented to person, place, and time. He appears well-developed and well-nourished. No distress.  HENT:  Head: Normocephalic and atraumatic.  Eyes: Conjunctivae are normal. Pupils are  equal, round, and reactive to light.  Neck: Normal range of motion. Neck supple.  Cardiovascular: Regular rhythm and intact distal pulses.   Pulmonary/Chest: Effort normal. No respiratory distress.  Musculoskeletal:  Patient has pain with extension rotation of the lumbar spine. He ambulates without aid with good distal strength. He has no pain with hip rotation. He has no pain over the greater trochanters. Looking at his hands he does have significant osteoarthritis of the DIP and PIP joints. There is no active synovitis. His hands are tender. There is no atrophy.  Neurological: He is alert and oriented to person, place, and time.  Skin: Skin is warm and dry. No rash noted. No erythema.  Psychiatric: He has a normal mood and affect.  Nursing note and vitals reviewed.   Ortho Exam Imaging: No results found.  Past Medical/Family/Surgical/Social History: Medications & Allergies reviewed per EMR Patient Active Problem List   Diagnosis Date Noted  . Insomnia 08/07/2015  . Benign neoplasm of colon 08/29/2011  .  DEPRESSION, SITUATIONAL, ACUTE 01/23/2010  . Essential hypertension 05/30/2009  . Coronary atherosclerosis 05/30/2009  . Obstructive sleep apnea 11/28/2008  . OBESITY 09/23/2008  . ERECTILE DYSFUNCTION 11/26/2007  . Well controlled type 2 diabetes mellitus with peripheral circulatory disorder ([place]) 05/09/2007  . Hyperlipidemia 05/09/2007  . MYOCARDIAL INFARCTION, HX OF 05/09/2007  . DIVERTICULOSIS, COLON 05/09/2007   Past Medical History:  Diagnosis Date  . Allergy   . CAD (coronary artery disease)   . Chronic kidney disease   . Diabetes mellitus   . Diverticulosis   . Erectile dysfunction   . Fatigue   . Hemorrhoids   . HTN (hypertension)   . Hyperlipidemia   . Myocardial infarction ([place])   . Obesity   . OSA (obstructive sleep apnea)   . Persistent disorder of initiating or maintaining sleep   . Personal history of colonic polyps 02/05/2003   Family History    Problem Relation Age of Onset  . Liver cancer Father   . Lung cancer Father   . Heart disease Father   . Heart disease Sister   . Lung cancer Mother   . COPD Mother   . Obesity Mother   . Colon cancer Neg Hx    Past Surgical History:  Procedure Laterality Date  . CHOLECYSTECTOMY    . CORONARY ANGIOPLASTY    . CORONARY STENT PLACEMENT     stenting of the right coronary artery with followup rotational  atherectomy.   Marland Kitchen FINGER SURGERY     right  . FOOT SURGERY     right  . INGUINAL HERNIA REPAIR     right  . ORTHOPEDIC SURGERY     foot right  . rotator cuff surg     Bil   Social History   Occupational History  . Not on file.   Social History Main Topics  . Smoking status: Former Smoker    Packs/day: 1.50    Years: 30.00    Types: Cigarettes    Quit date: 09/19/1980  . Smokeless tobacco: Never Used  . Alcohol use 4.2 - 8.4 oz/week    7 - 14 Shots of liquor per week  . Drug use: No  . Sexual activity: Not on file

## 2017-01-24 NOTE — Progress Notes (Deleted)
Pain was better while taking the ibuprofen 800 mg 3 times a day. Now without taking it the pain has returned to the same level as before.Right sided lower back, buttock and leg pain. Also has some pain going down left side now.

## 2017-01-25 ENCOUNTER — Encounter (INDEPENDENT_AMBULATORY_CARE_PROVIDER_SITE_OTHER): Payer: Self-pay | Admitting: Physical Medicine and Rehabilitation

## 2017-01-25 ENCOUNTER — Encounter: Payer: Self-pay | Admitting: Gastroenterology

## 2017-01-25 ENCOUNTER — Ambulatory Visit (AMBULATORY_SURGERY_CENTER): Payer: Medicare Other | Admitting: Gastroenterology

## 2017-01-25 VITALS — BP 124/63 | HR 60 | Temp 97.6°F | Resp 11 | Ht 72.0 in | Wt 245.0 lb

## 2017-01-25 DIAGNOSIS — G4733 Obstructive sleep apnea (adult) (pediatric): Secondary | ICD-10-CM | POA: Diagnosis not present

## 2017-01-25 DIAGNOSIS — E109 Type 1 diabetes mellitus without complications: Secondary | ICD-10-CM | POA: Diagnosis not present

## 2017-01-25 DIAGNOSIS — F329 Major depressive disorder, single episode, unspecified: Secondary | ICD-10-CM | POA: Diagnosis not present

## 2017-01-25 DIAGNOSIS — D122 Benign neoplasm of ascending colon: Secondary | ICD-10-CM

## 2017-01-25 DIAGNOSIS — Z86010 Personal history of colonic polyps: Secondary | ICD-10-CM | POA: Diagnosis not present

## 2017-01-25 DIAGNOSIS — Z86011 Personal history of benign neoplasm of the brain: Secondary | ICD-10-CM | POA: Diagnosis not present

## 2017-01-25 DIAGNOSIS — I10 Essential (primary) hypertension: Secondary | ICD-10-CM | POA: Diagnosis not present

## 2017-01-25 DIAGNOSIS — I2510 Atherosclerotic heart disease of native coronary artery without angina pectoris: Secondary | ICD-10-CM | POA: Diagnosis not present

## 2017-01-25 MED ORDER — SODIUM CHLORIDE 0.9 % IV SOLN: 500.0000 mL | INTRAVENOUS | Status: DC

## 2017-01-25 NOTE — Patient Instructions (Signed)
Impression/recommendations:  Polyps (handout given) Diverticulosis (handout given) High Fiber Diet (handout given)  YOU HAD AN ENDOSCOPIC PROCEDURE TODAY AT THE [facility]:   Refer to the procedure report that was given to you for any specific questions about what was found during the examination.  If the procedure report does not answer your questions, please call your gastroenterologist to clarify.  If you requested that your care partner not be given the details of your procedure findings, then the procedure report has been included in a sealed envelope for you to review at your convenience later.  YOU SHOULD EXPECT: Some feelings of bloating in the abdomen. Passage of more gas than usual.  Walking can help get rid of the air that was put into your GI tract during the procedure and reduce the bloating. If you had a lower endoscopy (such as a colonoscopy or flexible sigmoidoscopy) you may notice spotting of blood in your stool or on the toilet paper. If you underwent a bowel prep for your procedure, you may not have a normal bowel movement for a few days.  Please Note:  You might notice some irritation and congestion in your nose or some drainage.  This is from the oxygen used during your procedure.  There is no need for concern and it should clear up in a day or so.  SYMPTOMS TO REPORT IMMEDIATELY:   Following lower endoscopy (colonoscopy or flexible sigmoidoscopy):  Excessive amounts of blood in the stool  Significant tenderness or worsening of abdominal pains  Swelling of the abdomen that is new, acute  Fever of 100F or higher   For urgent or emergent issues, a gastroenterologist can be reached at any hour by calling [phone].   DIET:  We do recommend a small meal at first, but then you may proceed to your regular diet.  Drink plenty of fluids but you should avoid alcoholic beverages for 24 hours.  ACTIVITY:  You should plan to take it easy for the rest of today  and you should NOT DRIVE or use heavy machinery until tomorrow (because of the sedation medicines used during the test).    FOLLOW UP: Our staff will call the number listed on your records the next business day following your procedure to check on you and address any questions or concerns that you may have regarding the information given to you following your procedure. If we do not reach you, we will leave a message.  However, if you are feeling well and you are not experiencing any problems, there is no need to return our call.  We will assume that you have returned to your regular daily activities without incident.  If any biopsies were taken you will be contacted by phone or by letter within the next 1-3 weeks.  Please call us at [phone] if you have not heard about the biopsies in 3 weeks.    SIGNATURES/CONFIDENTIALITY: You and/or your care partner have signed paperwork which will be entered into your electronic medical record.  These signatures attest to the fact that that the information above on your After Visit Summary has been reviewed and is understood.  Full responsibility of the confidentiality of this discharge information lies with you and/or your care-partner. 

## 2017-01-25 NOTE — Progress Notes (Signed)
Called to room to assist during endoscopic procedure.  Patient ID and intended procedure confirmed with present staff. Received instructions for my participation in the procedure from the performing physician.  

## 2017-01-25 NOTE — Progress Notes (Signed)
TO PACU  Pt awake and alert. Report to RN 

## 2017-01-25 NOTE — Op Note (Signed)
[place] Patient Name: Brent [name] Procedure Date: 01/25/2017 8:13 AM MRN: [id number] Endoscopist: Mallie Mussel L. Loletha Carrow , MD Age: 71 Referring MD:  Date of Birth: [birth year] Gender: Male Account #: [id number] Procedure:                Colonoscopy Indications:              Surveillance: Personal history of adenomatous                            polyps on last colonoscopy > 5 years ago Medicines:                Monitored Anesthesia Care Procedure:                Pre-Anesthesia Assessment:                           - Prior to the procedure, a History and Physical                            was performed, and patient medications and                            allergies were reviewed. The patient's tolerance of                            previous anesthesia was also reviewed. The risks                            and benefits of the procedure and the sedation                            options and risks were discussed with the patient.                            All questions were answered, and informed consent                            was obtained. Anticoagulants: The patient has taken                            aspirin. It was decided not to withhold this                            medication prior to the procedure. ASA Grade                            Assessment: III - A patient with severe systemic                            disease. After reviewing the risks and benefits,                            the patient was deemed in satisfactory condition to  undergo the procedure.                           After obtaining informed consent, the colonoscope                            was passed under direct vision. Throughout the                            procedure, the patient's blood pressure, pulse, and                            oxygen saturations were monitored continuously. The                            Colonoscope was introduced through the anus and                             advanced to the the cecum, identified by                            appendiceal orifice and ileocecal valve. The                            colonoscopy was performed without difficulty. The                            patient tolerated the procedure well. The quality                            of the bowel preparation was good. The ileocecal                            valve, appendiceal orifice, and rectum were                            photographed. The quality of the bowel preparation                            was evaluated using the BBPS [facility] Bowel                            Preparation Scale) with scores of: Right Colon = 2,                            Transverse Colon = 2 and Left Colon = 2. The total                            BBPS score equals 6. The bowel preparation used was                            SUPREP. Scope In: 8:23:00 AM Scope Out: 8:38:17 AM Scope Withdrawal Time: 0 hours 11 minutes 26 seconds  Total Procedure Duration:  0 hours 15 minutes 17 seconds  Findings:                 The perianal and digital rectal examinations were                            normal.                           Two sessile polyps were found in the ascending                            colon. The polyps were 2 to 4 mm in size. These                            polyps were removed with a cold snare. Resection                            and retrieval were complete.                           Multiple large-mouthed diverticula were found in                            the entire colon.                           The exam was otherwise without abnormality on                            direct and retroflexion views. Complications:            No immediate complications. Estimated Blood Loss:     Estimated blood loss: none. Impression:               - Two 2 to 4 mm polyps in the ascending colon,                            removed with a cold snare. Resected and retrieved.                            - Diverticulosis in the entire examined colon.                           - The examination was otherwise normal on direct                            and retroflexion views. Recommendation:           - Patient has a contact number available for                            emergencies. The signs and symptoms of potential                            delayed complications were discussed with the  patient. Return to normal activities tomorrow.                            Written discharge instructions were provided to the                            patient.                           - Resume previous diet.                           - Continue present medications.                           - Await pathology results.                           - No repeat routine colonoscopy due to age. Yuepheng Schaller L. Loletha Carrow, MD 01/25/2017 8:46:54 AM This report has been signed electronically.

## 2017-01-25 NOTE — Telephone Encounter (Signed)
Done

## 2017-01-26 ENCOUNTER — Telehealth: Payer: Self-pay

## 2017-01-26 NOTE — Telephone Encounter (Signed)
  Follow up Call-  Call back number 01/25/2017  Post procedure Call Back phone  # [phone]  Permission to leave phone message Yes  Some recent data might be hidden     Patient questions:  Do you have a fever, pain , or abdominal swelling? No. Pain Score  0 *  Have you tolerated food without any problems? Yes.    Have you been able to return to your normal activities? Yes.    Do you have any questions about your discharge instructions: Diet   No. Medications  No. Follow up visit  No.  Do you have questions or concerns about your Care? No.  Actions: * If pain score is 4 or above: No action needed, pain <4.

## 2017-01-31 ENCOUNTER — Encounter: Payer: Self-pay | Admitting: Gastroenterology

## 2017-01-31 NOTE — Telephone Encounter (Signed)
Scheduled for 02/08/17 at 4pm with a driver.

## 2017-01-31 NOTE — Telephone Encounter (Signed)
No precert required. Left message # 1 for pt to call back for scheduling.

## 2017-02-03 ENCOUNTER — Ambulatory Visit (INDEPENDENT_AMBULATORY_CARE_PROVIDER_SITE_OTHER): Payer: Medicare Other | Admitting: Internal Medicine

## 2017-02-03 ENCOUNTER — Encounter: Payer: Self-pay | Admitting: Internal Medicine

## 2017-02-03 VITALS — BP 144/62 | HR 79 | Temp 98.2°F | Ht 72.0 in | Wt 245.4 lb

## 2017-02-03 DIAGNOSIS — E1151 Type 2 diabetes mellitus with diabetic peripheral angiopathy without gangrene: Secondary | ICD-10-CM | POA: Diagnosis not present

## 2017-02-03 DIAGNOSIS — M19041 Primary osteoarthritis, right hand: Secondary | ICD-10-CM | POA: Diagnosis not present

## 2017-02-03 DIAGNOSIS — M19042 Primary osteoarthritis, left hand: Secondary | ICD-10-CM | POA: Diagnosis not present

## 2017-02-03 DIAGNOSIS — I10 Essential (primary) hypertension: Secondary | ICD-10-CM

## 2017-02-03 LAB — SEDIMENTATION RATE: Sed Rate: 2 mm/hr (ref 0–20)

## 2017-02-03 NOTE — Progress Notes (Signed)
Subjective:    Patient ID: Brent Gip., male    DOB: 06-Dec-1945, 71 y.o.   MRN: [id number]  HPI  71 year old patient who presents with a chief complaint of pain and stiffness involving his hands of 2 weeks' duration. He is followed by orthopedics due to low back pain and is scheduled for a epidural injections soon. He states that pain first began about 2 weeks ago when he sustained fairly minor trauma to the lateral aspect of his left hand resulting in significant pain over the past 2 weeks pain has involved both hands and is worse in the late afternoon and early evening.  He still has some pain and stiffness when he awakes in the morning.  Pain seems maximal involving the right first and second PIP joints and the left second PIP joint.  No other joint involvement or systemic complaints No new medications He has been prescribed ibuprofen but really has taken none of this medicine for the past 2 weeks  Past Medical History:  Diagnosis Date  . Allergy   . CAD (coronary artery disease)   . Chronic kidney disease   . Diabetes mellitus   . Diverticulosis   . Erectile dysfunction   . Fatigue   . Hemorrhoids   . HTN (hypertension)   . Hyperlipidemia   . Myocardial infarction ([place])   . Obesity   . OSA (obstructive sleep apnea)   . Persistent disorder of initiating or maintaining sleep   . Personal history of colonic polyps 02/05/2003     Social History   Social History  . Marital status: Married    Spouse name: N/A  . Number of children: N/A  . Years of education: N/A   Occupational History  . Not on file.   Social History Main Topics  . Smoking status: Former Smoker    Packs/day: 1.50    Years: 30.00    Types: Cigarettes    Quit date: 09/19/1980  . Smokeless tobacco: Never Used  . Alcohol use 4.2 - 8.4 oz/week    7 - 14 Shots of liquor per week  . Drug use: No  . Sexual activity: Not on file   Other Topics Concern  . Not on file   Social History Narrative    . No narrative on file    Past Surgical History:  Procedure Laterality Date  . CHOLECYSTECTOMY    . CORONARY ANGIOPLASTY    . CORONARY STENT PLACEMENT     stenting of the right coronary artery with followup rotational  atherectomy.   Marland Kitchen FINGER SURGERY     right  . FOOT SURGERY     right  . INGUINAL HERNIA REPAIR     right  . ORTHOPEDIC SURGERY     foot right  . rotator cuff surg     Bil    Family History  Problem Relation Age of Onset  . Liver cancer Father   . Lung cancer Father   . Heart disease Father   . Heart disease Sister   . Lung cancer Mother   . COPD Mother   . Obesity Mother   . Colon cancer Neg Hx     Allergies  Allergen Reactions  . Penicillins Hives and Rash    Current Outpatient Prescriptions on File Prior to Visit  Medication Sig Dispense Refill  . aspirin 81 MG tablet Take 81 mg by mouth daily.    . fenofibrate 160 MG tablet Take 160 mg by mouth daily.    Marland Kitchen  fluticasone (FLONASE) 50 MCG/ACT nasal spray Place 2 sprays into both nostrils daily. 48 g 3  . ibuprofen (ADVIL,MOTRIN) 800 MG tablet Take 1 tablet (800 mg total) by mouth 3 (three) times daily with meals. 90 tablet 0  . Insulin Human (INSULIN PUMP) SOLN Inject 1 each into the skin continuous. Novolog 1 unit for every 15 carbs    . lisinopril (PRINIVIL,ZESTRIL) 5 MG tablet Take 5 mg by mouth daily.      Marland Kitchen LORazepam (ATIVAN) 1 MG tablet TAKE 1 TABLET BY MOUTH TWICE DAILY AS NEEDED FOR ANXIETY 180 tablet 1  . metFORMIN (GLUCOPHAGE) 1000 MG tablet Take 1,000 mg by mouth 2 (two) times daily with a meal.    . Multiple Vitamins-Minerals (CENTRUM SILVER PO) 1 tab po qd     . rosuvastatin (CRESTOR) 20 MG tablet Take 1 tablet (20 mg total) by mouth daily. 90 tablet 3  . sertraline (ZOLOFT) 100 MG tablet Take 150 mg by mouth daily. Total of 150mg  daily    . vitamin B-12 (CYANOCOBALAMIN) 1000 MCG tablet Take 1,000 mcg by mouth daily.      Marland Kitchen zolpidem (AMBIEN) 10 MG tablet Take 10 mg by mouth at bedtime as  needed. For insomnia     Current Facility-Administered Medications on File Prior to Visit  Medication Dose Route Frequency Provider Last Rate Last Dose  . 0.9 %  sodium chloride infusion  500 mL Intravenous Continuous Danis, Estill Cotta III, MD        BP (!) 144/62 (BP Location: Left Arm, Patient Position: Sitting, Cuff Size: Large)   Pulse 79   Temp 98.2 F (36.8 C) (Oral)   Ht 6' (1.829 m)   Wt 245 lb 6.4 oz (111.3 kg)   SpO2 98%   BMI 33.28 kg/m     Review of Systems  Constitutional: Negative for appetite change, chills, fatigue and fever.  HENT: Negative for congestion, dental problem, ear pain, hearing loss, sore throat, tinnitus, trouble swallowing and voice change.   Eyes: Negative for pain, discharge and visual disturbance.  Respiratory: Negative for cough, chest tightness, wheezing and stridor.   Cardiovascular: Negative for chest pain, palpitations and leg swelling.  Gastrointestinal: Negative for abdominal distention, abdominal pain, blood in stool, constipation, diarrhea, nausea and vomiting.  Genitourinary: Negative for difficulty urinating, discharge, flank pain, genital sores, hematuria and urgency.  Musculoskeletal: Positive for arthralgias. Negative for back pain, gait problem, joint swelling, myalgias and neck stiffness.  Skin: Negative for rash.  Neurological: Negative for dizziness, syncope, speech difficulty, weakness, numbness and headaches.  Hematological: Negative for adenopathy. Does not bruise/bleed easily.  Psychiatric/Behavioral: Negative for behavioral problems and dysphoric mood. The patient is not nervous/anxious.        Objective:   Physical Exam  Constitutional: He appears well-developed and well-nourished. No distress.  Musculoskeletal:  Scattered mild Heberden's nodes of several DIP joints Suggestion of mild swelling about the second PIP joints bilaterally Chronic deformity of the right fifth finger with inability to fully extend            Assessment & Plan:   Hand arthritis.  Will try ibuprofen 2 or 3 times daily.  We'll check a sedimentation rate, and anti-CCP antibody Follow-up 4 months Will report any clinical worsening  Nyoka Cowden

## 2017-02-03 NOTE — Patient Instructions (Addendum)
WE NOW OFFER   [facility] FAST TRACK!!!  SAME DAY Appointments for ACUTE CARE  Such as: Sprains, Injuries, cuts, abrasions, rashes, muscle pain, joint pain, back pain Colds, flu, sore throats, headache, allergies, cough, fever  Ear pain, sinus and eye infections Abdominal pain, nausea, vomiting, diarrhea, upset stomach Animal/insect bites  3 Easy Ways to Schedule: Walk-In Scheduling Call in scheduling Mychart Sign-up: [email or web address]         Arthritis Arthritis means joint pain. It can also mean joint disease. A joint is a place where bones come together. People who have arthritis may have:  Red joints.  Swollen joints.  Stiff joints.  Warm joints.  A fever.  A feeling of being sick. Follow these instructions at home: Pay attention to any changes in your symptoms. Take these actions to help with your pain and swelling. Medicines   Take over-the-counter and prescription medicines only as told by your doctor.  Do not take aspirin for pain if your doctor says that you may have gout. Activity   Rest your joint if your doctor tells you to.  Avoid activities that make the pain worse.  Exercise your joint regularly as told by your doctor. Try doing exercises like:  Swimming.  Water aerobics.  Biking.  Walking. Joint Care    If your joint is swollen, keep it raised (elevated) if told by your doctor.  If your joint feels stiff in the morning, try taking a warm shower.  If you have diabetes, do not apply heat without asking your doctor.  If told, apply heat to the joint:  Put a towel between the joint and the hot pack or heating pad.  Leave the heat on the area for 20-30 minutes.  If told, apply ice to the joint:  Put ice in a plastic bag.  Place a towel between your skin and the bag.  Leave the ice on for 20 minutes, 2-3 times per day.  Keep all follow-up visits as told by your doctor. Contact a doctor if:  The  pain gets worse.  You have a fever. Get help right away if:  You have very bad pain in your joint.  You have swelling in your joint.  Your joint is red.  Many joints become painful and swollen.  You have very bad back pain.  Your leg is very weak.  You cannot control your pee (urine) or poop (stool). This information is not intended to replace advice given to you by your health care provider. Make sure you discuss any questions you have with your health care provider. Document Released: 11/30/2009 Document Revised: 02/11/2016 Document Reviewed: 12/01/2014 Elsevier Interactive Patient Education  2017 [employer].

## 2017-02-06 LAB — CYCLIC CITRUL PEPTIDE ANTIBODY, IGG: Cyclic Citrullin Peptide Ab: <16 Units

## 2017-02-08 ENCOUNTER — Encounter (INDEPENDENT_AMBULATORY_CARE_PROVIDER_SITE_OTHER): Payer: Self-pay | Admitting: Physical Medicine and Rehabilitation

## 2017-02-08 ENCOUNTER — Ambulatory Visit (INDEPENDENT_AMBULATORY_CARE_PROVIDER_SITE_OTHER): Payer: Medicare Other | Admitting: Physical Medicine and Rehabilitation

## 2017-02-08 ENCOUNTER — Ambulatory Visit (INDEPENDENT_AMBULATORY_CARE_PROVIDER_SITE_OTHER): Payer: Self-pay

## 2017-02-08 VITALS — BP 140/73 | HR 81

## 2017-02-08 DIAGNOSIS — M47816 Spondylosis without myelopathy or radiculopathy, lumbar region: Secondary | ICD-10-CM | POA: Diagnosis not present

## 2017-02-08 MED ORDER — METHYLPREDNISOLONE ACETATE 80 MG/ML IJ SUSP
80.0000 mg | Freq: Once | INTRAMUSCULAR | Status: AC
  Administered 2017-02-08: 80 mg

## 2017-02-08 MED ORDER — LIDOCAINE HCL (PF) 1 % IJ SOLN
2.0000 mL | Freq: Once | INTRAMUSCULAR | Status: AC
  Administered 2017-02-08: 2 mL

## 2017-02-08 NOTE — Patient Instructions (Signed)

## 2017-02-08 NOTE — Progress Notes (Deleted)
Patient is here today for planned right L4-5 and L5-S1 facet injection. No change in symptoms.

## 2017-02-09 NOTE — Procedures (Signed)
Lumbar Facet Joint Intra-Articular Injection(s) with Fluoroscopic Guidance  Patient: Brent [name].      Date of Birth: 11-11-1945 MRN: [id number] PCP: Marletta Lor, MD      Visit Date: 02/08/2017   Brent [name] is a 71 year old gentleman here for planned right L4-5 and L5-S1 facet joint block diagnostically. He does well with this we'll watch it and direct physical therapy towards helping him with core strengthening and neutral spine activities. At that point depending on length of result we would look at either further imaging or possibly radiofrequency ablation.  Universal Protocol:    Date/Time: 05/24/181:13 PM  Consent Given By: the patient  Position: PRONE   Additional Comments: Vital signs were monitored before and after the procedure. Patient was prepped and draped in the usual sterile fashion. The correct patient, procedure, and site was verified.   Injection Procedure Details:  Procedure Site One Meds Administered:  Meds ordered this encounter  Medications  . lidocaine (PF) (XYLOCAINE) 1 % injection 2 mL  . methylPREDNISolone acetate (DEPO-MEDROL) injection 80 mg     Laterality: Right  Location/Site:  L4-L5 L5-S1  Needle size: 22 guage  Needle type: Spinal  Needle Placement: Articular  Findings:  -Contrast Used: 1 mL iohexol 180 mg iodine/mL   -Comments: Excellent flow of contrast producing a partial arthrogram.  Procedure Details: The fluoroscope beam is vertically oriented in AP, and the inferior recess is visualized beneath the lower pole of the inferior apophyseal process, which represents the target point for needle insertion. When direct visualization is difficult the target point is located at the medial projection of the vertebral pedicle. The region overlying each aforementioned target is locally anesthetized with a 1 to 2 ml. volume of 1% Lidocaine without Epinephrine.   The spinal needle was inserted into each of the above mentioned  facet joints using biplanar fluoroscopic guidance. A 0.25 to 0.5 ml. volume of Isovue-250 was injected and a partial facet joint arthrogram was obtained. A single spot film was obtained of the resulting arthrogram.    One to 1.25 ml of the steroid/anesthetic solution was then injected into each of the facet joints noted above.   Additional Comments:  No complications occurred Dressing: Band-Aid    Post-procedure details: Patient was observed during the procedure. Post-procedure instructions were reviewed.  Patient left the clinic in stable condition.

## 2017-02-14 ENCOUNTER — Other Ambulatory Visit: Payer: Self-pay | Admitting: Internal Medicine

## 2017-02-16 ENCOUNTER — Ambulatory Visit: Payer: Medicare Other | Admitting: Internal Medicine

## 2017-03-21 DIAGNOSIS — E119 Type 2 diabetes mellitus without complications: Secondary | ICD-10-CM | POA: Diagnosis not present

## 2017-03-21 LAB — HM DIABETES EYE EXAM

## 2017-04-06 ENCOUNTER — Ambulatory Visit: Payer: Medicare Other | Admitting: Internal Medicine

## 2017-04-14 ENCOUNTER — Encounter: Payer: Self-pay | Admitting: Internal Medicine

## 2017-04-17 ENCOUNTER — Other Ambulatory Visit: Payer: Self-pay

## 2017-06-08 ENCOUNTER — Encounter: Payer: Self-pay | Admitting: Internal Medicine

## 2017-06-19 DIAGNOSIS — N401 Enlarged prostate with lower urinary tract symptoms: Secondary | ICD-10-CM | POA: Diagnosis not present

## 2017-06-19 DIAGNOSIS — N289 Disorder of kidney and ureter, unspecified: Secondary | ICD-10-CM | POA: Diagnosis not present

## 2017-06-19 DIAGNOSIS — N138 Other obstructive and reflux uropathy: Secondary | ICD-10-CM | POA: Diagnosis not present

## 2017-06-19 DIAGNOSIS — N200 Calculus of kidney: Secondary | ICD-10-CM | POA: Diagnosis not present

## 2017-06-19 DIAGNOSIS — N481 Balanitis: Secondary | ICD-10-CM | POA: Diagnosis not present

## 2017-06-19 DIAGNOSIS — R3915 Urgency of urination: Secondary | ICD-10-CM | POA: Diagnosis not present

## 2017-06-19 DIAGNOSIS — N281 Cyst of kidney, acquired: Secondary | ICD-10-CM | POA: Diagnosis not present

## 2017-07-05 ENCOUNTER — Ambulatory Visit (INDEPENDENT_AMBULATORY_CARE_PROVIDER_SITE_OTHER): Payer: Medicare Other

## 2017-07-05 DIAGNOSIS — Z0000 Encounter for general adult medical examination without abnormal findings: Secondary | ICD-10-CM | POA: Diagnosis not present

## 2017-07-05 DIAGNOSIS — Z23 Encounter for immunization: Secondary | ICD-10-CM

## 2017-07-05 NOTE — Progress Notes (Addendum)
Subjective:   Brent [name]. is a 71 y.o. male who presents for Medicare Annual/Subsequent preventive examination.  .The Patient was informed that the wellness visit is to identify future health risk and educate and initiate measures that can reduce risk for increased disease through the lifespan.    Annual Wellness Assessment  Reports health as good   [facility] IS FOLLOWING DIABETES ALSO FOLLOWING CARDIAC MEDS GETTING ALL MEDS FORM [facility]  Scheduled to go to [place] but goes to [facility]  Medicare Annual Preventive Care Visit - Subsequent Last OV 01/2017  Colonoscopy 01/2017 Health Maintenance Due  Topic Date Due  . FOOT EXAM  03/21/2015  . INFLUENZA VACCINE  04/19/2017   MEDS: States the [place] stopped his insulin pump    Placed him on Lantus 60 u at hs and Novolog ac meals   Also taking testosterone    Flu  Vaccine given Foot exam completed   ETOH; occasional Smoking hx; quit 82; 45 pack year hx AAA waived due to post CT abd and pelvis with contrast  Noted no evidence of aneurysm throughout the pelvic and abd vasculature  VS reviewed;   Diet  Brent [name] to monitor carbs Breakfast bacon /eggs; toast pork chop biscuit Lunch; sandwich Supper main meal Vegetables in the evening   Raising Brent [name]; 68 yo  HIs son passed unexpectedly    BMI 33.6   Exercise Taking care of Brent [name] Plays soccer and basketball Still a member of the gym Back has hurt some and had to stop briefly  Dr. Ernestina Patches; shot helped   Diabetic eye exam 03/2017   Stressors:  Hx of some depression but coping well now  Endocrinologist has him taking testosterone which has helped Was discussing feeling tired   Sleep patterns:   Pain in hands;  Dropping things  8 in the am and after he loosens his joints; 2 or 3     Cardiac Risk Factors Addressed Hx of MI - coronary stent  Family + for HD  Hyperlipidemia - CHOL/HDL 6; chol 153;  HDL 27; trig 449  Diabetes - A1c 7.0  States last A1c 5.5 and under good control   Obesity 33   Advanced Directives completed   Patient Care Team: Marletta Lor, MD as PCP - General (Internal Medicine)  [facility] [place]; Going to [place] office     Cardiac Risk Factors include: advanced age (>81men, >86 women);diabetes mellitus;dyslipidemia;family history of premature cardiovascular disease;hypertension;male gender;obesity (BMI >30kg/m2)     Objective:    Vitals: BP (!) 144/60   Pulse 81   Ht 6' (1.829 m)   Wt 248 lb (112.5 kg)   SpO2 98%   BMI 33.63 kg/m   Body mass index is 33.63 kg/m. Had sausage biscuit this am; Educated regarding sodium  Tobacco History  Smoking Status  . Former Smoker  . Packs/day: 1.50  . Years: 30.00  . Types: Cigarettes  . Quit date: 09/19/1980  Smokeless Tobacco  . Never Used     Counseling given: Yes   Past Medical History:  Diagnosis Date  . Allergy   . CAD (coronary artery disease)   . Chronic kidney disease   . Diabetes mellitus   . Diverticulosis   . Erectile dysfunction   . Fatigue   . Hemorrhoids   . HTN (hypertension)   . Hyperlipidemia   . Myocardial infarction ([place])   . Obesity   . OSA (  obstructive sleep apnea)   . Persistent disorder of initiating or maintaining sleep   . Personal history of colonic polyps 02/05/2003   Past Surgical History:  Procedure Laterality Date  . CHOLECYSTECTOMY    . CORONARY ANGIOPLASTY    . CORONARY STENT PLACEMENT     stenting of the right coronary artery with followup rotational  atherectomy.   Marland Kitchen FINGER SURGERY     right  . FOOT SURGERY     right  . INGUINAL HERNIA REPAIR     right  . ORTHOPEDIC SURGERY     foot right  . rotator cuff surg     Bil   Family History  Problem Relation Age of Onset  . Liver cancer Father   . Lung cancer Father   . Heart disease Father   . Heart disease Sister   . Lung cancer Mother   . COPD Mother   . Obesity Mother   .  Colon cancer Neg Hx    History  Sexual Activity  . Sexual activity: Not on file    Outpatient Encounter Prescriptions as of 07/05/2017  Medication Sig  . aspirin 81 MG tablet Take 81 mg by mouth daily.  . fenofibrate 160 MG tablet Take 160 mg by mouth daily.  . fluticasone (FLONASE) 50 MCG/ACT nasal spray Place 2 sprays into both nostrils daily.  Marland Kitchen ibuprofen (ADVIL,MOTRIN) 800 MG tablet Take 1 tablet (800 mg total) by mouth 3 (three) times daily with meals.  Marland Kitchen lisinopril (PRINIVIL,ZESTRIL) 5 MG tablet Take 5 mg by mouth daily.    Marland Kitchen LORazepam (ATIVAN) 1 MG tablet TAKE 1 TABLET TWICE A DAY AS NEEDED FOR ANXIETY  . metFORMIN (GLUCOPHAGE) 1000 MG tablet Take 1,000 mg by mouth 2 (two) times daily with a meal.  . Multiple Vitamins-Minerals (CENTRUM SILVER PO) 1 tab po qd   . rosuvastatin (CRESTOR) 20 MG tablet Take 1 tablet (20 mg total) by mouth daily.  . sertraline (ZOLOFT) 100 MG tablet Take 150 mg by mouth daily. Total of 150mg  daily  . vitamin B-12 (CYANOCOBALAMIN) 1000 MCG tablet Take 1,000 mcg by mouth daily.    Marland Kitchen zolpidem (AMBIEN) 10 MG tablet Take 10 mg by mouth at bedtime as needed. For insomnia  . Insulin Human (INSULIN PUMP) SOLN Inject 1 each into the skin continuous. Novolog 1 unit for every 15 carbs   Facility-Administered Encounter Medications as of 07/05/2017  Medication  . 0.9 %  sodium chloride infusion    Activities of Daily Living In your present state of health, do you have any difficulty performing the following activities: 07/05/2017  Hearing? N  Vision? N  Difficulty concentrating or making decisions? N  Walking or climbing stairs? N  Dressing or bathing? N  Doing errands, shopping? N  Preparing Food and eating ? N  Using the Toilet? N  In the past six months, have you accidently leaked urine? Y  Comment goes to urologist   Do you have problems with loss of bowel control? N  Managing your Medications? N  Managing your Finances? N  Housekeeping or managing  your Housekeeping? N  Some recent data might be hidden    Patient Care Team: Marletta Lor, MD as PCP - General (Internal Medicine)   Assessment:     Exercise Activities and Dietary recommendations Current Exercise Habits: Structured exercise class, Time (Minutes): 60, Frequency (Times/Week): 5, Weekly Exercise (Minutes/Week): 300, Intensity: Moderate  Goals    . Weight (lb) < 235 lb (106.6 kg)  If you lose 10 to 12 lbs, you will lose 1in in your waist  Get back to the gym;  We know that 30 minutes will help you metabolically but 60 will burn calories Continue with your work out       Fall Risk Fall Risk  07/05/2017 03/25/2015  Falls in the past year? No No   Depression Screen PHQ 2/9 Scores 07/05/2017 03/25/2015  PHQ - 2 Score 0 1    Cognitive Function MMSE - Mini Mental State Exam 07/05/2017  Not completed: (No Data)   Ad8 score reviewed for issues:  Issues making decisions:  Less interest in hobbies / activities:  Repeats questions, stories (family complaining):  Trouble using ordinary gadgets (microwave, computer, phone):  Forgets the month or year:   Mismanaging finances:   Remembering appts:  Daily problems with thinking and/or memory: Ad8 score is=0          Immunization History  Administered Date(s) Administered  . Influenza Split 06/13/2011, 07/04/2012  . Influenza Whole 06/24/2008, 06/18/2009, 08/06/2010  . Influenza, High Dose Seasonal PF 07/29/2016, 07/05/2017  . Influenza,inj,Quad PF,6+ Mos 06/14/2013, 05/28/2014, 07/13/2015  . Pneumococcal Conjugate-13 09/22/2014  . Pneumococcal Polysaccharide-23 09/19/2002, 06/13/2011  . Td 09/19/2001  . Tdap 01/21/2014  . Zoster 09/20/2011   Screening Tests Health Maintenance  Topic Date Due  . FOOT EXAM  03/21/2015  . INFLUENZA VACCINE  04/19/2017  . HEMOGLOBIN A1C  06/25/2018 (Originally 05/01/2017)  . OPHTHALMOLOGY EXAM  03/21/2018  . COLONOSCOPY  01/25/2022  . TETANUS/TDAP   01/22/2024  . Hepatitis C Screening  Completed  . PNA vac Low Risk Adult  Completed      Plan:      PCP Notes   Health Maintenance BP elevated but last [facility] check 124/ 76 Had sausage biscuit this am   Educated regarding sodium   Flu vaccine given today   Foot exam completed   Abnormal Screens  Educated on BMI 33.6   Referrals  Educated regarding [employer]    Patient concerns; C/o about OA of hand, especially middle finger on on the left hand Will make a fup apt with Dr. Raliegh Ip to evaluate as pain is described as an 8 in the am   Nurse Concerns; As noted   Next PCP apt    I have personally reviewed and noted the following in the patient's chart:   . Medical and social history . Use of alcohol, tobacco or illicit drugs  . Current medications and supplements . Functional ability and status . Nutritional status . Physical activity . Advanced directives . List of other physicians . Hospitalizations, surgeries, and ER visits in previous 12 months . Vitals . Screenings to include cognitive, depression, and falls . Referrals and appointments  In addition, I have reviewed and discussed with patient certain preventive protocols, quality metrics, and best practice recommendations. A written personalized care plan for preventive services as well as general preventive health recommendations were provided to patient.     CBJSE,GBTDV, RN  07/05/2017  Results of patient's annual Medicare wellness visit, reviewed and agree with findings.  Nyoka Cowden

## 2017-07-05 NOTE — Patient Instructions (Addendum)
Mr. Pepitone , Thank you for taking time to come for your Medicare Wellness Visit. I appreciate your ongoing commitment to your health goals. Please review the following plan we discussed and let me know if I can assist you in the future.  Hearing right year; tinnitis [employer]  Try to get your labs from the [place] when you go again Sees endocrinologist every 3 months  Can make an OV with Dr. Raliegh Ip to review OA and other hand issues    These are the goals we discussed: Goals    . Weight (lb) < 235 lb (106.6 kg)          If you lose 10 to 12 lbs, you will lose 1in in your waist  Get back to the gym;  We know that 30 minutes will help you metabolically but 60 will burn calories Continue with your work out        This is a list of the screening recommended for you and due dates:  Health Maintenance  Topic Date Due  . Complete foot exam   03/21/2015  . Flu Shot  04/19/2017  . Hemoglobin A1C  05/01/2017  . Eye exam for diabetics  03/21/2018  . Colon Cancer Screening  01/25/2022  . Tetanus Vaccine  01/22/2024  .  Hepatitis C: One time screening is recommended by Center for Disease Control  (CDC) for  adults born from [birth year] through 1967.   Completed  . Pneumonia vaccines  Completed    Prevention of falls: Remove rugs or any tripping hazards in the home Use Non slip mats in bathtubs and showers Placing grab bars next to the toilet and or shower Placing handrails on both sides of the stair way Adding extra lighting in the home.   Personal safety issues reviewed:  1. Consider starting a community watch program per [facility] 2.  Changes batteries is smoke detector and/or carbon monoxide detector  3.  If you have firearms; keep them in a safe place 4.  Wear protection when in the sun; Always wear sunscreen or a hat; It is good to have your doctor check your skin annually or review any new areas of concern 5. Driving safety; Keep in the right lane; stay 3 car  lengths behind the car in front of you on the highway; look 3 times prior to pulling out; carry your cell phone everywhere you go!    Learn about the Yellow Dot program:  The program allows first responders at your emergency to have access to who your physician is, as well as your medications and medical conditions.  Citizens requesting the Yellow Dot Packages should contact Master Corporal Nunzio Cobbs at the [facility] [phone] for the first week of the program and beginning the week after Easter citizens should contact their Scientist, physiological.        Diabetes and Foot Care Diabetes may cause you to have problems because of poor blood supply (circulation) to your feet and legs. This may cause the skin on your feet to become thinner, break easier, and heal more slowly. Your skin may become dry, and the skin may peel and crack. You may also have nerve damage in your legs and feet causing decreased feeling in them. You may not notice minor injuries to your feet that could lead to infections or more serious problems. Taking care of your feet is one of the most important things you can do for yourself. Follow  these instructions at home:  Wear shoes at all times, even in the house. Do not go barefoot. Bare feet are easily injured.  Check your feet daily for blisters, cuts, and redness. If you cannot see the bottom of your feet, use a mirror or ask someone for help.  Wash your feet with warm water (do not use hot water) and mild soap. Then pat your feet and the areas between your toes until they are completely dry. Do not soak your feet as this can dry your skin.  Apply a moisturizing lotion or petroleum jelly (that does not contain alcohol and is unscented) to the skin on your feet and to dry, brittle toenails. Do not apply lotion between your toes.  Trim your toenails straight across. Do not dig under them or around the cuticle. File the edges of your  nails with an emery board or nail file.  Do not cut corns or calluses or try to remove them with medicine.  Wear clean socks or stockings every day. Make sure they are not too tight. Do not wear knee-high stockings since they may decrease blood flow to your legs.  Wear shoes that fit properly and have enough cushioning. To break in new shoes, wear them for just a few hours a day. This prevents you from injuring your feet. Always look in your shoes before you put them on to be sure there are no objects inside.  Do not cross your legs. This may decrease the blood flow to your feet.  If you find a minor scrape, cut, or break in the skin on your feet, keep it and the skin around it clean and dry. These areas may be cleansed with mild soap and water. Do not cleanse the area with peroxide, alcohol, or iodine.  When you remove an adhesive bandage, be sure not to damage the skin around it.  If you have a wound, look at it several times a day to make sure it is healing.  Do not use heating pads or hot water bottles. They may burn your skin. If you have lost feeling in your feet or legs, you may not know it is happening until it is too late.  Make sure your health care provider performs a complete foot exam at least annually or more often if you have foot problems. Report any cuts, sores, or bruises to your health care provider immediately. Contact a health care provider if:  You have an injury that is not healing.  You have cuts or breaks in the skin.  You have an ingrown nail.  You notice redness on your legs or feet.  You feel burning or tingling in your legs or feet.  You have pain or cramps in your legs and feet.  Your legs or feet are numb.  Your feet always feel cold. Get help right away if:  There is increasing redness, swelling, or pain in or around a wound.  There is a red line that goes up your leg.  Pus is coming from a wound.  You develop a fever or as directed by your  health care provider.  You notice a bad smell coming from an ulcer or wound. This information is not intended to replace advice given to you by your health care provider. Make sure you discuss any questions you have with your health care provider. Document Released: 09/02/2000 Document Revised: 02/11/2016 Document Reviewed: 02/12/2013 Elsevier Interactive Patient Education  2017 [place]  in the Home Falls can cause injuries. They can happen to people of all ages. There are many things you can do to make your home safe and to help prevent falls. What can I do on the outside of my home?  Regularly fix the edges of walkways and driveways and fix any cracks.  Remove anything that might make you trip as you walk through a door, such as a raised step or threshold.  Trim any bushes or trees on the path to your home.  Use bright outdoor lighting.  Clear any walking paths of anything that might make someone trip, such as rocks or tools.  Regularly check to see if handrails are loose or broken. Make sure that both sides of any steps have handrails.  Any raised decks and porches should have guardrails on the edges.  Have any leaves, snow, or ice cleared regularly.  Use sand or salt on walking paths during winter.  Clean up any spills in your garage right away. This includes oil or grease spills. What can I do in the bathroom?  Use night lights.  Install grab bars by the toilet and in the tub and shower. Do not use towel bars as grab bars.  Use non-skid mats or decals in the tub or shower.  If you need to sit down in the shower, use a plastic, non-slip stool.  Keep the floor dry. Clean up any water that spills on the floor as soon as it happens.  Remove soap buildup in the tub or shower regularly.  Attach bath mats securely with double-sided non-slip rug tape.  Do not have throw rugs and other things on the floor that can make you trip. What can I do in the  bedroom?  Use night lights.  Make sure that you have a light by your bed that is easy to reach.  Do not use any sheets or blankets that are too big for your bed. They should not hang down onto the floor.  Have a firm chair that has side arms. You can use this for support while you get dressed.  Do not have throw rugs and other things on the floor that can make you trip. What can I do in the kitchen?  Clean up any spills right away.  Avoid walking on wet floors.  Keep items that you use a lot in easy-to-reach places.  If you need to reach something above you, use a strong step stool that has a grab bar.  Keep electrical cords out of the way.  Do not use floor polish or wax that makes floors slippery. If you must use wax, use non-skid floor wax.  Do not have throw rugs and other things on the floor that can make you trip. What can I do with my stairs?  Do not leave any items on the stairs.  Make sure that there are handrails on both sides of the stairs and use them. Fix handrails that are broken or loose. Make sure that handrails are as long as the stairways.  Check any carpeting to make sure that it is firmly attached to the stairs. Fix any carpet that is loose or worn.  Avoid having throw rugs at the top or bottom of the stairs. If you do have throw rugs, attach them to the floor with carpet tape.  Make sure that you have a light switch at the top of the stairs and the bottom of the stairs. If you do not have  them, ask someone to add them for you. What else can I do to help prevent falls?  Wear shoes that: ? Do not have high heels. ? Have rubber bottoms. ? Are comfortable and fit you well. ? Are closed at the toe. Do not wear sandals.  If you use a stepladder: ? Make sure that it is fully opened. Do not climb a closed stepladder. ? Make sure that both sides of the stepladder are locked into place. ? Ask someone to hold it for you, if possible.  Clearly mark and make  sure that you can see: ? Any grab bars or handrails. ? First and last steps. ? Where the edge of each step is.  Use tools that help you move around (mobility aids) if they are needed. These include: ? Canes. ? Walkers. ? Scooters. ? Crutches.  Turn on the lights when you go into a dark area. Replace any light bulbs as soon as they burn out.  Set up your furniture so you have a clear path. Avoid moving your furniture around.  If any of your floors are uneven, fix them.  If there are any pets around you, be aware of where they are.  Review your medicines with your doctor. Some medicines can make you feel dizzy. This can increase your chance of falling. Ask your doctor what other things that you can do to help prevent falls. This information is not intended to replace advice given to you by your health care provider. Make sure you discuss any questions you have with your health care provider. Document Released: 07/02/2009 Document Revised: 02/11/2016 Document Reviewed: 10/10/2014 Elsevier Interactive Patient Education  2018 [place] Maintenance, Male A healthy lifestyle and preventive care is important for your health and wellness. Ask your health care provider about what schedule of regular examinations is right for you. What should I know about weight and diet? Eat a Healthy Diet  Eat plenty of vegetables, fruits, whole grains, low-fat dairy products, and lean protein.  Do not eat a lot of foods high in solid fats, added sugars, or salt.  Maintain a Healthy Weight Regular exercise can help you achieve or maintain a healthy weight. You should:  Do at least 150 minutes of exercise each week. The exercise should increase your heart rate and make you sweat (moderate-intensity exercise).  Do strength-training exercises at least twice a week.  Watch Your Levels of Cholesterol and Blood Lipids  Have your blood tested for lipids and cholesterol every 5 years starting  at 73 years of age. If you are at high risk for heart disease, you should start having your blood tested when you are 73 years old. You may need to have your cholesterol levels checked more often if: ? Your lipid or cholesterol levels are high. ? You are older than 73 years of age. ? You are at high risk for heart disease.  What should I know about cancer screening? Many types of cancers can be detected early and may often be prevented. Lung Cancer  You should be screened every year for lung cancer if: ? You are a current smoker who has smoked for at least 30 years. ? You are a former smoker who has quit within the past 15 years.  Talk to your health care provider about your screening options, when you should start screening, and how often you should be screened.  Colorectal Cancer  Routine colorectal cancer screening usually begins at 73 years of  age and should be repeated every 5-10 years until you are 73 years old. You may need to be screened more often if early forms of precancerous polyps or small growths are found. Your health care provider may recommend screening at an earlier age if you have risk factors for colon cancer.  Your health care provider may recommend using home test kits to check for hidden blood in the stool.  A small camera at the end of a tube can be used to examine your colon (sigmoidoscopy or colonoscopy). This checks for the earliest forms of colorectal cancer.  Prostate and Testicular Cancer  Depending on your age and overall health, your health care provider may do certain tests to screen for prostate and testicular cancer.  Talk to your health care provider about any symptoms or concerns you have about testicular or prostate cancer.  Skin Cancer  Check your skin from head to toe regularly.  Tell your health care provider about any new moles or changes in moles, especially if: ? There is a change in a mole's size, shape, or color. ? You have a mole that  is larger than a pencil eraser.  Always use sunscreen. Apply sunscreen liberally and repeat throughout the day.  Protect yourself by wearing long sleeves, pants, a wide-brimmed hat, and sunglasses when outside.  What should I know about heart disease, diabetes, and high blood pressure?  If you are 87-21 years of age, have your blood pressure checked every 3-5 years. If you are 36 years of age or older, have your blood pressure checked every year. You should have your blood pressure measured twice-once when you are at a hospital or clinic, and once when you are not at a hospital or clinic. Record the average of the two measurements. To check your blood pressure when you are not at a hospital or clinic, you can use: ? An automated blood pressure machine at a pharmacy. ? A home blood pressure monitor.  Talk to your health care provider about your target blood pressure.  If you are between 38-78 years old, ask your health care provider if you should take aspirin to prevent heart disease.  Have regular diabetes screenings by checking your fasting blood sugar level. ? If you are at a normal weight and have a low risk for diabetes, have this test once every three years after the age of 50. ? If you are overweight and have a high risk for diabetes, consider being tested at a younger age or more often.  A one-time screening for abdominal aortic aneurysm (AAA) by ultrasound is recommended for men aged 44-75 years who are current or former smokers. What should I know about preventing infection? Hepatitis B If you have a higher risk for hepatitis B, you should be screened for this virus. Talk with your health care provider to find out if you are at risk for hepatitis B infection. Hepatitis C Blood testing is recommended for:  Everyone born from [birth year] through 1967.  Anyone with known risk factors for hepatitis C.  Sexually Transmitted Diseases (STDs)  You should be screened each year for STDs  including gonorrhea and chlamydia if: ? You are sexually active and are younger than 73 years of age. ? You are older than 73 years of age and your health care provider tells you that you are at risk for this type of infection. ? Your sexual activity has changed since you were last screened and you are at an increased risk  for chlamydia or gonorrhea. Ask your health care provider if you are at risk.  Talk with your health care provider about whether you are at high risk of being infected with HIV. Your health care provider may recommend a prescription medicine to help prevent HIV infection.  What else can I do?  Schedule regular health, dental, and eye exams.  Stay current with your vaccines (immunizations).  Do not use any tobacco products, such as cigarettes, chewing tobacco, and e-cigarettes. If you need help quitting, ask your health care provider.  Limit alcohol intake to no more than 2 drinks per day. One drink equals 12 ounces of beer, 5 ounces of wine, or 1 ounces of hard liquor.  Do not use street drugs.  Do not share needles.  Ask your health care provider for help if you need support or information about quitting drugs.  Tell your health care provider if you often feel depressed.  Tell your health care provider if you have ever been abused or do not feel safe at home. This information is not intended to replace advice given to you by your health care provider. Make sure you discuss any questions you have with your health care provider. Document Released: 03/03/2008 Document Revised: 05/04/2016 Document Reviewed: 06/09/2015 Elsevier Interactive Patient Education  [employer].

## 2017-07-13 ENCOUNTER — Other Ambulatory Visit: Payer: Self-pay | Admitting: Internal Medicine

## 2017-07-25 ENCOUNTER — Telehealth: Payer: Self-pay | Admitting: Internal Medicine

## 2017-07-25 NOTE — Telephone Encounter (Signed)
Pt needs new rx hydrocodone °

## 2017-07-25 NOTE — Telephone Encounter (Signed)
Appointment was made for 07/25/17.

## 2017-07-26 ENCOUNTER — Encounter: Payer: Self-pay | Admitting: Internal Medicine

## 2017-07-26 ENCOUNTER — Ambulatory Visit (INDEPENDENT_AMBULATORY_CARE_PROVIDER_SITE_OTHER): Payer: Medicare Other | Admitting: Internal Medicine

## 2017-07-26 VITALS — BP 162/78 | HR 76 | Temp 98.3°F | Ht 72.0 in | Wt 242.6 lb

## 2017-07-26 DIAGNOSIS — I2510 Atherosclerotic heart disease of native coronary artery without angina pectoris: Secondary | ICD-10-CM | POA: Diagnosis not present

## 2017-07-26 DIAGNOSIS — E1151 Type 2 diabetes mellitus with diabetic peripheral angiopathy without gangrene: Secondary | ICD-10-CM | POA: Diagnosis not present

## 2017-07-26 DIAGNOSIS — E7800 Pure hypercholesterolemia, unspecified: Secondary | ICD-10-CM | POA: Diagnosis not present

## 2017-07-26 DIAGNOSIS — I10 Essential (primary) hypertension: Secondary | ICD-10-CM | POA: Diagnosis not present

## 2017-07-26 MED ORDER — HYDROCODONE-ACETAMINOPHEN 5-325 MG PO TABS: 1.0000 | ORAL_TABLET | Freq: Four times a day (QID) | ORAL | 0 refills | Status: DC | PRN

## 2017-07-26 MED ORDER — HYDROCODONE-HOMATROPINE 5-1.5 MG/5ML PO SYRP: 5.0000 mL | ORAL_SOLUTION | Freq: Four times a day (QID) | ORAL | 0 refills | Status: DC | PRN

## 2017-07-26 NOTE — Patient Instructions (Signed)
Limit your sodium (Salt) intake   Please check your hemoglobin A1c every 3 months    It is important that you exercise regularly, at least 20 minutes 3 to 4 times per week.  If you develop chest pain or shortness of breath seek  medical attention.  You need to lose weight.  Consider a lower calorie diet and regular exercise. 

## 2017-07-26 NOTE — Progress Notes (Signed)
Subjective:    Patient ID: Brent Gip., male    DOB: [birth year], 71 y.o.   MRN: [id number]  HPI  71 year old patient who is seen today for follow-up.  He has had a recent annual Medicare wellness visit.  He is followed by endocrinology for diabetes both locally and also at the [facility].  He has until recently been on a insulin pump but presently is on basal bolus insulin. He has a history of hypertension.  He states he also has been diagnosed with testosterone deficiency and is on bimonthly injections.  He has B12 deficiency.  Past Medical History:  Diagnosis Date  . Allergy   . CAD (coronary artery disease)   . Chronic kidney disease   . Diabetes mellitus   . Diverticulosis   . Erectile dysfunction   . Fatigue   . Hemorrhoids   . HTN (hypertension)   . Hyperlipidemia   . Myocardial infarction ([place])   . Obesity   . OSA (obstructive sleep apnea)   . Persistent disorder of initiating or maintaining sleep   . Personal history of colonic polyps 02/05/2003     Social History   Socioeconomic History  . Marital status: Married    Spouse name: Not on file  . Number of children: Not on file  . Years of education: Not on file  . Highest education level: Not on file  Social Needs  . Financial resource strain: Not on file  . Food insecurity - worry: Not on file  . Food insecurity - inability: Not on file  . Transportation needs - medical: Not on file  . Transportation needs - non-medical: Not on file  Occupational History  . Not on file  Tobacco Use  . Smoking status: Former Smoker    Packs/day: 1.50    Years: 30.00    Pack years: 45.00    Types: Cigarettes    Last attempt to quit: 09/19/1980    Years since quitting: 36.8  . Smokeless tobacco: Never Used  Substance and Sexual Activity  . Alcohol use: Yes    Alcohol/week: 4.2 - 8.4 oz    Types: 7 - 14 Shots of liquor per week  . Drug use: No  . Sexual activity: Not on file  Other Topics Concern  . Not  on file  Social History Narrative  . Not on file    Past Surgical History:  Procedure Laterality Date  . CHOLECYSTECTOMY    . CORONARY ANGIOPLASTY    . CORONARY STENT PLACEMENT     stenting of the right coronary artery with followup rotational  atherectomy.   Marland Kitchen FINGER SURGERY     right  . FOOT SURGERY     right  . INGUINAL HERNIA REPAIR     right  . ORTHOPEDIC SURGERY     foot right  . rotator cuff surg     Bil    Family History  Problem Relation Age of Onset  . Liver cancer Father   . Lung cancer Father   . Heart disease Father   . Heart disease Sister   . Lung cancer Mother   . COPD Mother   . Obesity Mother   . Colon cancer Neg Hx     Allergies  Allergen Reactions  . Penicillins Hives and Rash    Current Outpatient Medications on File Prior to Visit  Medication Sig Dispense Refill  . aspirin 81 MG tablet Take 81 mg by mouth daily.    Marland Kitchen  fenofibrate 160 MG tablet Take 160 mg by mouth daily.    . fluticasone (FLONASE) 50 MCG/ACT nasal spray PLACE 2 SPRAYS INTO BOTH NOSTRILS DAILY. 48 g 3  . insulin aspart (NOVOLOG FLEXPEN) 100 UNIT/ML FlexPen Inject 40 Units 3 (three) times daily with meals into the skin.    Marland Kitchen insulin glargine (LANTUS) 100 unit/mL SOPN Inject 60 Units at bedtime into the skin.    Marland Kitchen lisinopril (PRINIVIL,ZESTRIL) 5 MG tablet Take 5 mg by mouth daily.      Marland Kitchen LORazepam (ATIVAN) 1 MG tablet TAKE 1 TABLET BY MOUTH TWICE A DAY AS NEEDED ANXIETY 180 tablet 0  . metFORMIN (GLUCOPHAGE) 1000 MG tablet Take 1,000 mg by mouth 2 (two) times daily with a meal.    . Multiple Vitamins-Minerals (CENTRUM SILVER PO) 1 tab po qd     . rosuvastatin (CRESTOR) 20 MG tablet Take 1 tablet (20 mg total) by mouth daily. 90 tablet 3  . sertraline (ZOLOFT) 100 MG tablet Take 150 mg by mouth daily. Total of 150mg  daily    . vitamin B-12 (CYANOCOBALAMIN) 1000 MCG tablet Take 1,000 mcg by mouth daily.      Marland Kitchen zolpidem (AMBIEN) 10 MG tablet Take 10 mg by mouth at bedtime as  needed. For insomnia     No current facility-administered medications on file prior to visit.     BP (!) 162/78 (BP Location: Left Arm, Patient Position: Sitting, Cuff Size: Normal)   Pulse 76   Temp 98.3 F (36.8 C) (Oral)   Ht 6' (1.829 m)   Wt 242 lb 9.6 oz (110 kg)   SpO2 96%   BMI 32.90 kg/m     Review of Systems  Constitutional: Negative for appetite change, chills, fatigue and fever.  HENT: Negative for congestion, dental problem, ear pain, hearing loss, sore throat, tinnitus, trouble swallowing and voice change.   Eyes: Negative for pain, discharge and visual disturbance.  Respiratory: Positive for cough. Negative for chest tightness, wheezing and stridor.   Cardiovascular: Negative for chest pain, palpitations and leg swelling.  Gastrointestinal: Negative for abdominal distention, abdominal pain, blood in stool, constipation, diarrhea, nausea and vomiting.  Genitourinary: Negative for difficulty urinating, discharge, flank pain, genital sores, hematuria and urgency.  Musculoskeletal: Negative for arthralgias, back pain, gait problem, joint swelling, myalgias and neck stiffness.  Skin: Negative for rash.  Neurological: Negative for dizziness, syncope, speech difficulty, weakness, numbness and headaches.  Hematological: Negative for adenopathy. Does not bruise/bleed easily.  Psychiatric/Behavioral: Negative for behavioral problems and dysphoric mood. The patient is not nervous/anxious.        Objective:   Physical Exam  Constitutional: He is oriented to person, place, and time. He appears well-developed.  HENT:  Head: Normocephalic.  Right Ear: External ear normal.  Left Ear: External ear normal.  Eyes: Conjunctivae and EOM are normal.  Neck: Normal range of motion.  Cardiovascular: Normal rate and normal heart sounds.  Pulmonary/Chest: Breath sounds normal. No respiratory distress. He has no wheezes. He has no rales.  Abdominal: Bowel sounds are normal.    Musculoskeletal: Normal range of motion. He exhibits no edema or tenderness.  Neurological: He is alert and oriented to person, place, and time.  Psychiatric: He has a normal mood and affect. His behavior is normal.          Assessment & Plan:   Mild URI with cough.  He is requesting cough medicine.  He was initially offered the appointment today for evaluation of chronic pain  management due to his hydrocodone request.  He does not take hydrocodone for chronic pain.  He has seen orthopedics in the past and has done quite well with epidurals. Diabetes mellitus.  Now on basal bolus insulin.  He is followed at the [facility] and also by endocrinology here locally Hypertension Dyslipidemia continue statin therapy History of testosterone deficiency.  He presently is on testosterone injections.  And followed at the [facility]  Return here 6 months  Nyoka Cowden

## 2017-08-02 ENCOUNTER — Ambulatory Visit: Payer: Self-pay | Admitting: *Deleted

## 2017-08-02 ENCOUNTER — Encounter: Payer: Self-pay | Admitting: Family Medicine

## 2017-08-02 ENCOUNTER — Ambulatory Visit (INDEPENDENT_AMBULATORY_CARE_PROVIDER_SITE_OTHER): Payer: Medicare Other | Admitting: Family Medicine

## 2017-08-02 VITALS — BP 132/58 | HR 97 | Temp 99.5°F | Wt 246.0 lb

## 2017-08-02 DIAGNOSIS — I2510 Atherosclerotic heart disease of native coronary artery without angina pectoris: Secondary | ICD-10-CM

## 2017-08-02 DIAGNOSIS — B9789 Other viral agents as the cause of diseases classified elsewhere: Secondary | ICD-10-CM

## 2017-08-02 DIAGNOSIS — J069 Acute upper respiratory infection, unspecified: Secondary | ICD-10-CM | POA: Diagnosis not present

## 2017-08-02 NOTE — Progress Notes (Signed)
Brent [name] is a 71 year old married male nonsmoker patient of Dr. Raliegh Ip who comes in today to see if he has pneumonia  He was around his 38-year-old grandson 2 weeks ago who had a cold.  1 week ago he developed head congestion sore throat and cough. No fever chills etc. etc. He does have allergic rhinitis he uses steroid nasal spray. Dr. Raliegh Ip called him in some Hydromet on Tuesday. He's been taken a half a teaspoon to a full teaspoon at bedtime which helps with the nighttime cough  BP (!) 132/58 (BP Location: Left Arm, Patient Position: Sitting, Cuff Size: Normal)   Pulse 97   Temp 99.5 F (37.5 C) (Oral)   Wt 246 lb (111.6 kg)   SpO2 94%   BMI 33.36 kg/m  He's well-developed well-nourished male no acute distress vital signs stable he is afebrile HEENT were negative neck was supple no adenopathy lungs are clear  #1 viral syndrome with cough........... treat symptomatically.... Return when necessary

## 2017-08-02 NOTE — Patient Instructions (Signed)
Drink lots of liquids  Premedicate you nose with Afrin nasal spray then use the steroid nasal spray.......Marland Kitchen remember the Afrin has a 5 night limit  Hydromet per Dr. Raliegh Ip.........Marland Kitchen 1/2-1 teaspoon at bedtime when necessary for cough  Return when necessary

## 2017-08-02 NOTE — Telephone Encounter (Signed)
Wife states patient cough worse at times and has come chest congestion. Sob with exertion. Care advice given to patient.  Reason for Disposition . [1] Nasal discharge AND [2] present > 10 days  Answer Assessment - Initial Assessment Questions 1. ONSET: "When did the cough begin?"      Last week 2. SEVERITY: "How bad is the cough today?"      Moderate now 3. RESPIRATORY DISTRESS: "Describe your breathing."      Sob with exertion 4. FEVER: "Do you have a fever?" If so, ask: "What is your temperature, how was it measured, and when did it start?"     Not sure, slightly warm 5. SPUTUM: "Describe the color of your sputum" (clear, white, yellow, green)     Yellow sputum 6. HEMOPTYSIS: "Are you coughing up any blood?" If so ask: "How much?" (flecks, streaks, tablespoons, etc.)     Had some flecks of blood at the beginning 7. CARDIAC HISTORY: "Do you have any history of heart disease?" (e.g., heart attack, congestive heart failure)      Heart attack 8. LUNG HISTORY: "Do you have any history of lung disease?"  (e.g., pulmonary embolus, asthma, emphysema)     no 9. PE RISK FACTORS: "Do you have a history of blood clots?" (or: recent major surgery, recent prolonged travel, bedridden )     no 10. OTHER SYMPTOMS: "Do you have any other symptoms?" (e.g., runny nose, wheezing, chest pain)       no 11. PREGNANCY: "Is there any chance you are pregnant?" "When was your last menstrual period?"       no 12. TRAVEL: "Have you traveled out of the country in the last month?" (e.g., travel history, exposures)       no  Protocols used: [place]

## 2017-08-08 ENCOUNTER — Ambulatory Visit (INDEPENDENT_AMBULATORY_CARE_PROVIDER_SITE_OTHER)
Admission: RE | Admit: 2017-08-08 | Discharge: 2017-08-08 | Disposition: A | Discharge status: Home / Self Care | Payer: Medicare Other | Source: Ambulatory Visit | Attending: Internal Medicine | Admitting: Internal Medicine

## 2017-08-08 ENCOUNTER — Ambulatory Visit (INDEPENDENT_AMBULATORY_CARE_PROVIDER_SITE_OTHER): Payer: Medicare Other | Admitting: Internal Medicine

## 2017-08-08 ENCOUNTER — Other Ambulatory Visit: Payer: Medicare Other

## 2017-08-08 ENCOUNTER — Telehealth: Payer: Self-pay | Admitting: Internal Medicine

## 2017-08-08 ENCOUNTER — Encounter: Payer: Self-pay | Admitting: Internal Medicine

## 2017-08-08 VITALS — BP 122/62 | HR 91 | Temp 98.2°F | Ht 72.0 in | Wt 242.8 lb

## 2017-08-08 DIAGNOSIS — J069 Acute upper respiratory infection, unspecified: Secondary | ICD-10-CM

## 2017-08-08 DIAGNOSIS — J209 Acute bronchitis, unspecified: Secondary | ICD-10-CM | POA: Diagnosis not present

## 2017-08-08 DIAGNOSIS — R05 Cough: Secondary | ICD-10-CM | POA: Diagnosis not present

## 2017-08-08 DIAGNOSIS — I2510 Atherosclerotic heart disease of native coronary artery without angina pectoris: Secondary | ICD-10-CM | POA: Diagnosis not present

## 2017-08-08 DIAGNOSIS — B9789 Other viral agents as the cause of diseases classified elsewhere: Secondary | ICD-10-CM | POA: Diagnosis not present

## 2017-08-08 DIAGNOSIS — G4733 Obstructive sleep apnea (adult) (pediatric): Secondary | ICD-10-CM

## 2017-08-08 MED ORDER — LEVALBUTEROL HCL 0.63 MG/3ML IN NEBU
0.6300 mg | INHALATION_SOLUTION | Freq: Once | RESPIRATORY_TRACT | Status: AC
  Administered 2017-08-08: 0.63 mg via RESPIRATORY_TRACT

## 2017-08-08 MED ORDER — DOXYCYCLINE HYCLATE 100 MG PO TABS: ORAL_TABLET | ORAL | 0 refills | Status: DC

## 2017-08-08 MED ORDER — METHYLPREDNISOLONE ACETATE 80 MG/ML IJ SUSP
80.0000 mg | Freq: Once | INTRAMUSCULAR | Status: AC
  Administered 2017-08-08: 80 mg via INTRAMUSCULAR

## 2017-08-08 NOTE — Assessment & Plan Note (Signed)
Acute illness is most likely an infectious bronchitis, probably viral.  Sputum was initially discolored.  We will give doxycycline to be on the safe side, since this is a holiday week, but he will go to the ER if he gets worse. Plan-Xopenex nebulizer treatment, Depo-Medrol with glucose discussion, CXR, doxycycline

## 2017-08-08 NOTE — Patient Instructions (Addendum)
Script sent for doxycycline  Order- neb xop 0.63    fx acute bronchitis             Depo 22  Order- CXR     Dx acute bronchitis  Order- DME [facility]- please replace old CPAP machine, change to auto 10-20, mask of choice, humidifier, supplies, AirView     Dx OSA  If you get worse, go to the ER  Please call if we can help

## 2017-08-08 NOTE — Telephone Encounter (Signed)
Rx was supposed to be sent in but it didn't. Per KW - Doxy 100mg  2 tablets today then one daily until gone #6. Rx has been sent in. Nothing further was needed.

## 2017-08-08 NOTE — Assessment & Plan Note (Signed)
He describes good compliance and control with CPAP, saying he sleeps much better when he wears it.  Download not available today.  With machine is worn out. Plan-replacement for old CPAP machine, changing to AutoPap 10-20

## 2017-08-08 NOTE — Progress Notes (Signed)
HPI male former smoker followed for OSA, insomnia, complicated by CAD/MI/stent, DM2 NPSG 2008:  AHI 7/hr.  -------------------------------------------------------------------- 08/08/2016-71 year old male former smoker followed for OSA, insomnia, complicated by CAD/MI/stent, DM CPAP 17 [facility] FOLLOWS FOR: D[facility]; Pt wears CPAP every night for at least 6-7 hours; DL attached.No new supplies needed. Download 86% 4 hour compliance with CPAP 17, AHI 0.4/hour. He notes sleepiness in the morning after he's been up for a while. No naps usually. 3 cups of coffee. He uses Ambien 10 mg at bedtime often-discussed.  08/08/17- 71 year old male former smoker followed for OSA, insomnia, complicated by CAD/MI/stent, DM OSA; DME [facility]; pt needs new CPAP machine; currently wears his nightly-did not bring SD card and not in AV. Pt is sick as well today x 2 weeks; continues to have wheezing, chest tightness, SOB and cough.  Acute illness began 2 weeks ago with cough, productive initially dark green sputum, temperature 99 degrees some blood streaks in sputum, muscle aches.  Has had flu shot.  Mild GI upset from cough syrup.  Sputum color has cleared some.  ROS-see HPI   + = positive Constitutional:    weight loss, night sweats, fevers, chills, + fatigue, lassitude. HEENT:    headaches, difficulty swallowing, tooth/dental problems, sore throat,       sneezing, itching, ear ache, nasal congestion, post nasal drip, snoring CV:    chest pain, orthopnea, PND, swelling in lower extremities, anasarca,                                                      dizziness, palpitations Resp:   + Shortness of breath with exertion or at rest.               Aside productive cough,   non-productive cough, + coughing up of blood.              + Change in color of mucus.  wheezing.   Skin:    rash or lesions. GI:  No-   heartburn, indigestion, abdominal pain, nausea, vomiting,  GU:  MS:   joint pain, stiffness,  Neuro-      nothing unusual Psych:  change in mood or affect.  depression or anxiety.   memory loss.  OBJ- Physical Exam General- Alert, Oriented, Affect-appropriate, Distress- none acute Skin- rash-none, lesions- none, excoriation- none Lymphadenopathy- none Head- atraumatic            Eyes- Gross vision intact, PERRLA, conjunctivae and secretions clear            Ears- Hearing, canals-normal            Nose- Clear, no-Septal dev, mucus, polyps, erosion, perforation             Throat- Mallampati II-III , mucosa clear , drainage- none, tonsils- atrophic Neck- flexible , trachea midline, no stridor , thyroid nl, carotid no bruit Chest - symmetrical excursion , unlabored           Heart/CV- RRR , no murmur , no gallop  , no rub, nl s1 s2                           - JVD- none , edema- none, stasis changes- none, varices- none  Lung-+ wearing facemask, mildly labored with saturation 94%, wheeze- none,                    cough + raspy, dullness-none, rub- none           Chest wall-  Abd-  Br/ Gen/ Rectal- Not done, not indicated Extrem- cyanosis- none, clubbing, none, atrophy- none, strength- nl Neuro- grossly intact to observation

## 2017-08-09 ENCOUNTER — Telehealth: Payer: Self-pay | Admitting: Family Medicine

## 2017-08-09 ENCOUNTER — Telehealth: Payer: Self-pay | Admitting: Internal Medicine

## 2017-08-09 ENCOUNTER — Ambulatory Visit (INDEPENDENT_AMBULATORY_CARE_PROVIDER_SITE_OTHER): Payer: Medicare Other | Admitting: Physician Assistant

## 2017-08-09 ENCOUNTER — Telehealth: Payer: Self-pay | Admitting: Cardiovascular Disease

## 2017-08-09 ENCOUNTER — Encounter: Payer: Self-pay | Admitting: Physician Assistant

## 2017-08-09 VITALS — BP 148/66 | HR 75 | Ht 72.0 in | Wt 240.0 lb

## 2017-08-09 DIAGNOSIS — E785 Hyperlipidemia, unspecified: Secondary | ICD-10-CM

## 2017-08-09 DIAGNOSIS — R9389 Abnormal findings on diagnostic imaging of other specified body structures: Secondary | ICD-10-CM | POA: Diagnosis not present

## 2017-08-09 DIAGNOSIS — I2510 Atherosclerotic heart disease of native coronary artery without angina pectoris: Secondary | ICD-10-CM

## 2017-08-09 DIAGNOSIS — I10 Essential (primary) hypertension: Secondary | ICD-10-CM

## 2017-08-09 DIAGNOSIS — R5383 Other fatigue: Secondary | ICD-10-CM

## 2017-08-09 DIAGNOSIS — R05 Cough: Secondary | ICD-10-CM | POA: Diagnosis not present

## 2017-08-09 DIAGNOSIS — R0600 Dyspnea, unspecified: Secondary | ICD-10-CM | POA: Diagnosis not present

## 2017-08-09 DIAGNOSIS — R059 Cough, unspecified: Secondary | ICD-10-CM

## 2017-08-09 NOTE — Telephone Encounter (Signed)
New message    Patient spouse calling, states she was told by Dr Janee Morn office on yesterday her husband needed to be seen today because he "may have chf".  States patient is short of breath.     Pt c/o Shortness Of Breath: STAT if SOB developed within the last 24 hours or pt is noticeably SOB on the phone  1. Are you currently SOB (can you hear that pt is SOB on the phone)? n/a  2. How long have you been experiencing SOB? 2 weeks  3. Are you SOB when sitting or when up moving around?  After coughing  4. Are you currently experiencing any other symptoms?  acute bronchitis

## 2017-08-09 NOTE — Patient Instructions (Addendum)
Medication Instructions:  Your physician recommends that you continue on your current medications as directed. Please refer to the Current Medication list given to you today.   Labwork: STAT:  BMET, CBC, & PRO BNP , TSH  Testing/Procedures: Your physician has requested that you have an echocardiogram. Echocardiography is a painless test that uses sound waves to create images of your heart. It provides your doctor with information about the size and shape of your heart and how well your heart's chambers and valves are working. This procedure takes approximately one hour. There are no restrictions for this procedure.   Follow-Up: Your physician recommends that you schedule a follow-up appointment in:  [address] [place], PA-C [place] APP  Any Other Special Instructions Will Be Listed Below (If Applicable). Echocardiogram An echocardiogram, or echocardiography, uses sound waves (ultrasound) to produce an image of your heart. The echocardiogram is simple, painless, obtained within a short period of time, and offers valuable information to your health care provider. The images from an echocardiogram can provide information such as:  Evidence of coronary artery disease (CAD).  Heart size.  Heart muscle function.  Heart valve function.  Aneurysm detection.  Evidence of a past heart attack.  Fluid buildup around the heart.  Heart muscle thickening.  Assess heart valve function.  Tell a health care provider about:  Any allergies you have.  All medicines you are taking, including vitamins, herbs, eye drops, creams, and over-the-counter medicines.  Any problems you or family members have had with anesthetic medicines.  Any blood disorders you have.  Any surgeries you have had.  Any medical conditions you have.  Whether you are pregnant or may be pregnant. What happens before the procedure? No special preparation is needed. Eat and drink normally. What  happens during the procedure?  In order to produce an image of your heart, gel will be applied to your chest and a wand-like tool (transducer) will be moved over your chest. The gel will help transmit the sound waves from the transducer. The sound waves will harmlessly bounce off your heart to allow the heart images to be captured in real-time motion. These images will then be recorded.  You may need an IV to receive a medicine that improves the quality of the pictures. What happens after the procedure? You may return to your normal schedule including diet, activities, and medicines, unless your health care provider tells you otherwise. This information is not intended to replace advice given to you by your health care provider. Make sure you discuss any questions you have with your health care provider. Document Released: 09/02/2000 Document Revised: 04/23/2016 Document Reviewed: 05/13/2013 Elsevier Interactive Patient Education  2017 [employer].     If you need a refill on your cardiac medications before your next appointment, please call your pharmacy.

## 2017-08-09 NOTE — Progress Notes (Signed)
Cardiology Office Note    Date:  08/09/2017  ID:  Brent Tokarz., DOB 05/18/1946, MRN [id number] PCP:  Marletta Lor, MD  Cardiologist:  Angelena Form   Chief Complaint: cough  History of Present Illness:  Brent Lyness. is a 71 y.o. male with history of CAD s/p PCI 1990 & 2000, HTN, HLD, DM, OSA followed by pulm, obesity who presents for evaluation of possible CHF at the request of Dr. Annamaria Boots.   He was followed in the past by Dr. Lia Foyer. He had angioplasty of his RCA in 1990. He had a bare metal stent placed in the RCA in 2000 followed by rotational atherectomy shortly after for stent restenosis. His last cath was in 2012 showing stable moderate diffuse CAD. (70% mid LAD, 80% diagonal, 70% Ramus, 40% mid to distal RCA stent restenosis). He has done well with medical management. He is seeing an endocrinologist for his DM. He is on CPAP and see Dr. Gwenette Greet. He has had elevated CK on statins. He has been seen in the lipid clinic and was tried on Livalo which also caused elevated CK. He was placed back on Crestor. He has not been seen since 2016. Stress myoview January 2015 showed hypertensive response to exercise, 90mm ST segment depression in inferior and lateral leads with stress, but no ischemia and felt to be low risk. Last pertinent labs showed A1c of 7.0 (10/2016), K 4.5, Cr 0.87, normal LFTs, normal TSH, direct LDL 77; last CBC in 06/2015 showed Hgb 12.9.   He has been sick for the last 3 weeks or so. It started after his 87-year-old grandson had a URI and was out of school for 3 days. He developed initial symptoms of productive cough of green sputum, low grade temp elevation and myagias. He saw primary care twice in the meantime with supportive care including hydrocodone cough syrup. He saw pulmonology yesterday for sleep apnea follow-up and reported continued malaise, ongoing cough, new hoarseness for the last week, wheezing, dyspnea on exertion, and chest discomfort when coughing.  Dr. Annamaria Boots ordered CXR which showed cardiomegaly and multifocal bilateral pulmonary infiltrates and/or edema. He was given a shot of steroids and neb in the office and was prescribed doxycycline for acute bronchitis. No prior echo on file. He does not feel significantly different today but just started med last night. Denies any orthopnea, LEE, weight gain, PND, abdominal distention, or chest pain reminiscent of prior angina. He does report sensation of generalized fatigue that goes back for a few months.   Past Medical History:  Diagnosis Date  . Allergy   . CAD (coronary artery disease)    a. angioplasty of his RCA in 1990. b. bare metal stent placed in the RCA in 2000 followed by rotational atherectomy shortly after for stent restenosis. c. last cath was in 2012 showing stable moderate diffuse CAD. (70% mid LAD, 80% diagonal, 70% Ramus, 40% mid to distal RCA stent restenosis). d. Low risk nuc in 2015.  . Diabetes mellitus   . Diverticulosis   . Erectile dysfunction   . Hemorrhoids   . HTN (hypertension)   . Hyperlipidemia   . Myocardial infarction ([place])   . Obesity   . OSA (obstructive sleep apnea)   . Persistent disorder of initiating or maintaining sleep   . Personal history of colonic polyps 02/05/2003    Past Surgical History:  Procedure Laterality Date  . CHOLECYSTECTOMY    . CORONARY ANGIOPLASTY    . CORONARY STENT PLACEMENT  stenting of the right coronary artery with followup rotational  atherectomy.   Marland Kitchen FINGER SURGERY     right  . FOOT SURGERY     right  . INGUINAL HERNIA REPAIR     right  . ORTHOPEDIC SURGERY     foot right  . rotator cuff surg     Bil    Current Medications: Current Meds  Medication Sig  . aspirin 81 MG tablet Take 81 mg by mouth daily.  Marland Kitchen doxycycline (VIBRA-TABS) 100 MG tablet Take 2 tablets today then one daily until gone  . fenofibrate 160 MG tablet Take 160 mg by mouth daily.  . fluticasone (FLONASE) 50 MCG/ACT nasal spray PLACE 2  SPRAYS INTO BOTH NOSTRILS DAILY.  Marland Kitchen insulin aspart (NOVOLOG FLEXPEN) 100 UNIT/ML FlexPen Inject 40 Units 3 (three) times daily with meals into the skin.  Marland Kitchen insulin glargine (LANTUS) 100 unit/mL SOPN Inject 60 Units at bedtime into the skin.  Marland Kitchen lisinopril (PRINIVIL,ZESTRIL) 5 MG tablet Take 5 mg by mouth daily.    Marland Kitchen LORazepam (ATIVAN) 1 MG tablet TAKE 1 TABLET BY MOUTH TWICE A DAY AS NEEDED ANXIETY  . metFORMIN (GLUCOPHAGE) 1000 MG tablet Take 1,000 mg by mouth 2 (two) times daily with a meal.  . Multiple Vitamins-Minerals (CENTRUM SILVER PO) 1 tab po qd   . rosuvastatin (CRESTOR) 20 MG tablet Take 1 tablet (20 mg total) by mouth daily.  . sertraline (ZOLOFT) 100 MG tablet Take 150 mg by mouth daily. Total of 150mg  daily  . vitamin B-12 (CYANOCOBALAMIN) 1000 MCG tablet Take 1,000 mcg by mouth daily.    Marland Kitchen zolpidem (AMBIEN) 10 MG tablet Take 10 mg by mouth at bedtime as needed. For insomnia     Allergies:   Penicillins   Social History   Socioeconomic History  . Marital status: Married    Spouse name: None  . Number of children: None  . Years of education: None  . Highest education level: None  Social Needs  . Financial resource strain: None  . Food insecurity - worry: None  . Food insecurity - inability: None  . Transportation needs - medical: None  . Transportation needs - non-medical: None  Occupational History  . None  Tobacco Use  . Smoking status: Former Smoker    Packs/day: 1.50    Years: 30.00    Pack years: 45.00    Types: Cigarettes    Last attempt to quit: 09/19/1980    Years since quitting: 36.9  . Smokeless tobacco: Never Used  Substance and Sexual Activity  . Alcohol use: Yes    Alcohol/week: 4.2 - 8.4 oz    Types: 7 - 14 Shots of liquor per week  . Drug use: No  . Sexual activity: None  Other Topics Concern  . None  Social History Narrative  . None     Family History:  Family History  Problem Relation Age of Onset  . Liver cancer Father   . Lung  cancer Father   . Heart disease Father   . Heart disease Sister   . Lung cancer Mother   . COPD Mother   . Obesity Mother   . Colon cancer Neg Hx      ROS:   Please see the history of present illness.  All other systems are reviewed and otherwise negative.    PHYSICAL EXAM:   VS:  BP (!) 148/66   Pulse 75   Ht 6' (1.829 m)   Wt 240 lb (  108.9 kg)   SpO2 96%   BMI 32.55 kg/m   BMI: Body mass index is 32.55 kg/m. GEN: Well nourished, well developed obese WM, in no acute distress  HEENT: normocephalic, atraumatic Neck: no JVD, carotid bruits, or masses Cardiac: RRR; no murmurs, rubs, or gallops, no edema  Respiratory:  Coarse crackles at bases, otherwise no wheezing or rhonchi, normal work of breathing GI: soft, nontender, nondistended, + BS MS: no deformity or atrophy  Skin: warm and dry, no rash Neuro:  Alert and Oriented x 3, Strength and sensation are intact, follows commands Psych: euthymic mood, full affect  Wt Readings from Last 3 Encounters:  08/09/17 240 lb (108.9 kg)  08/08/17 242 lb 12.8 oz (110.1 kg)  08/02/17 246 lb (111.6 kg)      Studies/Labs Reviewed:   EKG:  EKG was ordered today and personally reviewed by me and demonstrates NSR 75bpm without acute ST-T changess, TWI avL no acute change from prior.  Recent Labs: No results found for requested labs within last 8760 hours.   Lipid Panel   Additional studies/ records that were reviewed today include: Summarized above    ASSESSMENT & PLAN:   1. Cough/dyspnea/abnormal CXR/hoarseness - suspect symptoms are c/w clinical pneumonia as suggested by CXR, particularly the low grade fevers, sputum production, hoarseness and onset after sick contact. It is completely feasible he could have component of interstitial edema superimposed on this, but has no other stigmata of heart failure so I do not think his infiltrates are primarily due to CHF. Will check stat BNP along with basic labs to evaluate for  contribution of HF. Will set up echo. Reviewed 2g sodium restriction, 2L fluid restriction (encouraged to stay hydrated while ill but not to excess). 2. CAD - he has had dyspnea coinciding with this recent acute illness. F/u echo. If this persists beyond treatment for PNA would consider updating ischemic testing. Will have him f/u closely in 2 weeks. Continue ASA, statin. 3. HTN - BP mildly elevated today but just received steroids yesterday. BP yesterday in pulm office was 122/62 so will follow up at next visit to determine if med adjustment is needed. 4. Hyperlipidemia - continue statin. 5. Fatigue - update labs including CBC and TSH. Will not be surprised if WBC elevation is present given steroids in pulm office yesterday.  Disposition: F/u with Dr. McAlhany/care team APP in 2 weeks.   Medication Adjustments/Labs and Tests Ordered: Current medicines are reviewed at length with the patient today.  Concerns regarding medicines are outlined above. Medication changes, Labs and Tests ordered today are summarized above and listed in the Patient Instructions accessible in Encounters.   Signed, Brent Pitter, Brent [name]  08/09/2017 9:56 AM    [facility] [place], [place], [place]  [address] Phone: [phone]; Fax: [phone]

## 2017-08-09 NOTE — Telephone Encounter (Signed)
Spoke with pt's spouse, [name] who states pt has been scheduled with Dr. Idolina Primer today.  Nothing further needed.

## 2017-08-09 NOTE — Telephone Encounter (Signed)
Chart reviewed and note on chest X-ray indicates pt needs to be seen by cardiology today. I reviewed with Melina Copa, PA and she can see pt today at 9:30 if he is able to get here. I spoke with pt's wife who reports pt continues to be short of breath but no worse than when seen by pulmonary yesterday.  I offered pt's wife appt today at 9:30 with Melina Copa, PA. I also told her if pt was unable to be here for this appt I would discuss with another provider in the office to see if he can be worked in later today.  Wife states pt feels he can be here at 9:30 today and will be here for appt.

## 2017-08-09 NOTE — Telephone Encounter (Signed)
Copied from [place]. Topic: General - Other >> Aug 09, 2017  2:37 PM Carolyn Stare wrote: Reason for CRM:    PT REFILL ON HYDROCODONE COUGH SYRUP

## 2017-08-10 LAB — BASIC METABOLIC PANEL
BUN/Creatinine Ratio: 18 (ref 10–24)
BUN: 16 mg/dL (ref 8–27)
CHLORIDE: 101 mmol/L (ref 96–106)
CO2: 20 mmol/L (ref 20–29)
Calcium: 8.7 mg/dL (ref 8.6–10.2)
Creatinine, Ser: 0.87 mg/dL (ref 0.76–1.27)
GFR, EST AFRICAN AMERICAN: 100 mL/min/{1.73_m2} (ref >59)
GFR, EST NON AFRICAN AMERICAN: 86 mL/min/{1.73_m2} (ref >59)
GLUCOSE: 87 mg/dL (ref 65–99)
Potassium: 4.3 mmol/L (ref 3.5–5.2)
Sodium: 138 mmol/L (ref 134–144)

## 2017-08-10 LAB — CBC
HEMATOCRIT: 38.3 % (ref 37.5–51.0)
HEMOGLOBIN: 12 g/dL — AB (ref 13.0–17.7)
MCH: 26.4 pg — AB (ref 26.6–33.0)
MCHC: 31.3 g/dL — AB (ref 31.5–35.7)
MCV: 84 fL (ref 79–97)
Platelets: 529 10*3/uL — ABNORMAL HIGH (ref 150–379)
RBC: 4.54 x10E6/uL (ref 4.14–5.80)
RDW: 14.7 % (ref 12.3–15.4)
WBC: 12 10*3/uL — ABNORMAL HIGH (ref 3.4–10.8)

## 2017-08-10 LAB — PRO B NATRIURETIC PEPTIDE: NT-Pro BNP: 287 pg/mL (ref 0–376)

## 2017-08-10 LAB — TSH: TSH: 2.51 u[IU]/mL (ref 0.450–4.500)

## 2017-08-14 NOTE — Telephone Encounter (Signed)
Please advise 

## 2017-08-14 NOTE — Telephone Encounter (Signed)
Okay for refill?  

## 2017-08-16 ENCOUNTER — Other Ambulatory Visit: Payer: Self-pay | Admitting: Internal Medicine

## 2017-08-16 MED ORDER — HYDROCODONE-HOMATROPINE 5-1.5 MG/5ML PO SYRP: 5.0000 mL | ORAL_SOLUTION | Freq: Three times a day (TID) | ORAL | 0 refills | Status: DC | PRN

## 2017-08-16 NOTE — Telephone Encounter (Signed)
Rx printed, awaiting to be signed by MD.

## 2017-08-17 NOTE — Telephone Encounter (Signed)
Pt notified Rx is ready for pickup. Rx printed and signed.

## 2017-08-18 ENCOUNTER — Other Ambulatory Visit: Payer: Self-pay

## 2017-08-18 ENCOUNTER — Ambulatory Visit (HOSPITAL_COMMUNITY): Payer: Medicare Other | Attending: Cardiology

## 2017-08-18 DIAGNOSIS — R05 Cough: Secondary | ICD-10-CM

## 2017-08-18 DIAGNOSIS — R0600 Dyspnea, unspecified: Secondary | ICD-10-CM | POA: Diagnosis not present

## 2017-08-18 DIAGNOSIS — I5030 Unspecified diastolic (congestive) heart failure: Secondary | ICD-10-CM | POA: Diagnosis not present

## 2017-08-18 DIAGNOSIS — R059 Cough, unspecified: Secondary | ICD-10-CM

## 2017-08-21 ENCOUNTER — Other Ambulatory Visit (INDEPENDENT_AMBULATORY_CARE_PROVIDER_SITE_OTHER): Payer: Medicare Other

## 2017-08-21 ENCOUNTER — Telehealth: Payer: Self-pay | Admitting: Internal Medicine

## 2017-08-21 ENCOUNTER — Ambulatory Visit (INDEPENDENT_AMBULATORY_CARE_PROVIDER_SITE_OTHER)
Admission: RE | Admit: 2017-08-21 | Discharge: 2017-08-21 | Disposition: A | Discharge status: Home / Self Care | Payer: Medicare Other | Source: Ambulatory Visit | Attending: Internal Medicine | Admitting: Internal Medicine

## 2017-08-21 DIAGNOSIS — J189 Pneumonia, unspecified organism: Secondary | ICD-10-CM | POA: Diagnosis not present

## 2017-08-21 DIAGNOSIS — R05 Cough: Secondary | ICD-10-CM | POA: Diagnosis not present

## 2017-08-21 LAB — CBC WITH DIFFERENTIAL/PLATELET
Basophils Absolute: 0 10*3/uL (ref 0.0–0.1)
Basophils Relative: 0.5 % (ref 0.0–3.0)
Eosinophils Absolute: 0.1 10*3/uL (ref 0.0–0.7)
Eosinophils Relative: 0.8 % (ref 0.0–5.0)
HCT: 44 % (ref 39.0–52.0)
Hemoglobin: 14.1 g/dL (ref 13.0–17.0)
Lymphocytes Relative: 30.2 % (ref 12.0–46.0)
Lymphs Abs: 2.6 10*3/uL (ref 0.7–4.0)
MCHC: 32.1 g/dL (ref 30.0–36.0)
MCV: 84 fl (ref 78.0–100.0)
Monocytes Absolute: 0.5 10*3/uL (ref 0.1–1.0)
Monocytes Relative: 5.2 % (ref 3.0–12.0)
Neutro Abs: 5.5 10*3/uL (ref 1.4–7.7)
Neutrophils Relative %: 63.3 % (ref 43.0–77.0)
Platelets: 453 10*3/uL — ABNORMAL HIGH (ref 150.0–400.0)
RBC: 5.24 Mil/uL (ref 4.22–5.81)
RDW: 16.2 % — ABNORMAL HIGH (ref 11.5–15.5)
WBC: 8.7 10*3/uL (ref 4.0–10.5)

## 2017-08-21 MED ORDER — FLUCONAZOLE 150 MG PO TABS: 150.0000 mg | ORAL_TABLET | Freq: Every day | ORAL | 0 refills | Status: DC

## 2017-08-21 NOTE — Telephone Encounter (Signed)
I have reviewed cardiology note. His CXR at last visit here was non-specific, but we thought he had some pneumonia and questioned whether there was some fluid overload.   I suggest we now order outpatient CXR for dx of pneumonia. And lab for repeat CBC w diff.  I want to see if the xray has begun to clear, and if there is evidence for bacterial infection.   For the hoarseness- we can try diflucan 150 mg, 1 daily x 3 days,   # 3.  A yeast infection in the throat could do this. If hoarseness doesn't get better, we can get an ENT to check his vocal cors.

## 2017-08-21 NOTE — Telephone Encounter (Signed)
Spoke with the pt's spouse  She states pt is still feeling fatigued since the last ov here 08/08/17  He is also still having discomfort in his chest with deep inspiration  She states this is no worse than before  He is coughing less and producing some clear sputum  He is so hoarse he can not speak  Spouse req recs and also wants CDY to review notes from cards (In [place]) Please advise thanks! Allergies  Allergen Reactions  . Penicillins Hives and Rash   Current Outpatient Medications on File Prior to Visit  Medication Sig Dispense Refill  . aspirin 81 MG tablet Take 81 mg by mouth daily.    Marland Kitchen doxycycline (VIBRA-TABS) 100 MG tablet Take 2 tablets today then one daily until gone 6 tablet 0  . fenofibrate 160 MG tablet Take 160 mg by mouth daily.    . fluticasone (FLONASE) 50 MCG/ACT nasal spray PLACE 2 SPRAYS INTO BOTH NOSTRILS DAILY. 48 g 3  . HYDROcodone-homatropine (HYCODAN) 5-1.5 MG/5ML syrup Take 5 mLs by mouth every 8 (eight) hours as needed for cough. 120 mL 0  . insulin aspart (NOVOLOG FLEXPEN) 100 UNIT/ML FlexPen Inject 40 Units 3 (three) times daily with meals into the skin.    Marland Kitchen insulin glargine (LANTUS) 100 unit/mL SOPN Inject 60 Units at bedtime into the skin.    Marland Kitchen lisinopril (PRINIVIL,ZESTRIL) 5 MG tablet Take 5 mg by mouth daily.      Marland Kitchen LORazepam (ATIVAN) 1 MG tablet TAKE 1 TABLET BY MOUTH TWICE A DAY AS NEEDED ANXIETY 180 tablet 0  . metFORMIN (GLUCOPHAGE) 1000 MG tablet Take 1,000 mg by mouth 2 (two) times daily with a meal.    . Multiple Vitamins-Minerals (CENTRUM SILVER PO) 1 tab po qd     . rosuvastatin (CRESTOR) 20 MG tablet Take 1 tablet (20 mg total) by mouth daily. 90 tablet 3  . sertraline (ZOLOFT) 100 MG tablet Take 150 mg by mouth daily. Total of 150mg  daily    . vitamin B-12 (CYANOCOBALAMIN) 1000 MCG tablet Take 1,000 mcg by mouth daily.      Marland Kitchen zolpidem (AMBIEN) 10 MG tablet Take 10 mg by mouth at bedtime as needed. For insomnia     No current  facility-administered medications on file prior to visit.

## 2017-08-21 NOTE — Telephone Encounter (Signed)
Spoke with pt's wife and advised of Dr Janee Morn recommendations.  Rx sent to pharmacy.  Order placed for cxr and cbcd.  She verbalized understanding.  Nothing further needed.

## 2017-08-25 ENCOUNTER — Telehealth: Payer: Self-pay | Admitting: Internal Medicine

## 2017-08-25 MED ORDER — PREDNISONE 10 MG PO TABS: 10.0000 mg | ORAL_TABLET | Freq: Every day | ORAL | 0 refills | Status: DC

## 2017-08-25 NOTE — Telephone Encounter (Signed)
The xrays have shown a chronic bronchitis pattern. I suggest we try prednisone 10 mg, # 10, 1 daily.  This will raise blood sugar in diabetic, so he will need to watch for this.  We are hoping the anti-inflammatory effect of prednisone will make his chest feel better and help his breathing.

## 2017-08-25 NOTE — Telephone Encounter (Signed)
Pt is coughing up some clear thick mucus but is still fatigued.  Pt is wanting to know if something can be sent in for him. Dr. Annamaria Boots, please advise if we can do this for pt.  Thanks!

## 2017-08-25 NOTE — Telephone Encounter (Signed)
Spoke with pt letting him know I was sending in a script of prednisone 10mg  #10 for him to take one daily. Told pt that he needed to monitor his sugars while taking this med as it could raise his sugars. Pt expressed understanding. Nothing further needed.

## 2017-08-28 ENCOUNTER — Ambulatory Visit: Payer: Medicare Other | Admitting: Physician Assistant

## 2017-09-04 ENCOUNTER — Telehealth: Payer: Self-pay | Admitting: Internal Medicine

## 2017-09-04 NOTE — Telephone Encounter (Signed)
lmtcb x1 for pt. 

## 2017-09-04 NOTE — Telephone Encounter (Signed)
Spoke with pt, scheduled to see TP at 12 on Wednesday.  Nothing further needed.

## 2017-09-04 NOTE — Telephone Encounter (Signed)
Please see if one of the NPs could see him before [facility]

## 2017-09-04 NOTE — Telephone Encounter (Signed)
Spoke with pt, he states he is not 100% better, he has some cough with no mucus, voice is not normal, shallow breathing when doing nothing,  and still very fatigue. He just finished the prednisone and is wondering if he should take something else for his symptoms. Please advise CY.  [facility]  Current Outpatient Medications on File Prior to Visit  Medication Sig Dispense Refill  . aspirin 81 MG tablet Take 81 mg by mouth daily.    Marland Kitchen doxycycline (VIBRA-TABS) 100 MG tablet Take 2 tablets today then one daily until gone 6 tablet 0  . fenofibrate 160 MG tablet Take 160 mg by mouth daily.    . fluconazole (DIFLUCAN) 150 MG tablet Take 1 tablet (150 mg total) by mouth daily. 3 tablet 0  . fluticasone (FLONASE) 50 MCG/ACT nasal spray PLACE 2 SPRAYS INTO BOTH NOSTRILS DAILY. 48 g 3  . HYDROcodone-homatropine (HYCODAN) 5-1.5 MG/5ML syrup Take 5 mLs by mouth every 8 (eight) hours as needed for cough. 120 mL 0  . insulin aspart (NOVOLOG FLEXPEN) 100 UNIT/ML FlexPen Inject 40 Units 3 (three) times daily with meals into the skin.    Marland Kitchen insulin glargine (LANTUS) 100 unit/mL SOPN Inject 60 Units at bedtime into the skin.    Marland Kitchen lisinopril (PRINIVIL,ZESTRIL) 5 MG tablet Take 5 mg by mouth daily.      Marland Kitchen LORazepam (ATIVAN) 1 MG tablet TAKE 1 TABLET BY MOUTH TWICE A DAY AS NEEDED ANXIETY 180 tablet 0  . metFORMIN (GLUCOPHAGE) 1000 MG tablet Take 1,000 mg by mouth 2 (two) times daily with a meal.    . Multiple Vitamins-Minerals (CENTRUM SILVER PO) 1 tab po qd     . predniSONE (DELTASONE) 10 MG tablet Take 1 tablet (10 mg total) by mouth daily with breakfast. 10 tablet 0  . rosuvastatin (CRESTOR) 20 MG tablet Take 1 tablet (20 mg total) by mouth daily. 90 tablet 3  . sertraline (ZOLOFT) 100 MG tablet Take 150 mg by mouth daily. Total of 150mg  daily    . vitamin B-12 (CYANOCOBALAMIN) 1000 MCG tablet Take 1,000 mcg by mouth daily.      Marland Kitchen zolpidem (AMBIEN) 10 MG tablet Take 10 mg by mouth at bedtime as needed. For  insomnia     No current facility-administered medications on file prior to visit.    Allergies  Allergen Reactions  . Penicillins Hives and Rash

## 2017-09-06 ENCOUNTER — Ambulatory Visit (INDEPENDENT_AMBULATORY_CARE_PROVIDER_SITE_OTHER): Payer: Medicare Other | Admitting: Adult Health

## 2017-09-06 ENCOUNTER — Ambulatory Visit (INDEPENDENT_AMBULATORY_CARE_PROVIDER_SITE_OTHER)
Admission: RE | Admit: 2017-09-06 | Discharge: 2017-09-06 | Disposition: A | Discharge status: Home / Self Care | Payer: Medicare Other | Source: Ambulatory Visit | Attending: Adult Health | Admitting: Adult Health

## 2017-09-06 ENCOUNTER — Encounter: Payer: Self-pay | Admitting: Adult Health

## 2017-09-06 VITALS — BP 140/64 | HR 77 | Ht 72.0 in | Wt 232.6 lb

## 2017-09-06 DIAGNOSIS — I2510 Atherosclerotic heart disease of native coronary artery without angina pectoris: Secondary | ICD-10-CM

## 2017-09-06 DIAGNOSIS — J181 Lobar pneumonia, unspecified organism: Secondary | ICD-10-CM | POA: Diagnosis not present

## 2017-09-06 DIAGNOSIS — J189 Pneumonia, unspecified organism: Secondary | ICD-10-CM | POA: Diagnosis not present

## 2017-09-06 DIAGNOSIS — R05 Cough: Secondary | ICD-10-CM | POA: Diagnosis not present

## 2017-09-06 MED ORDER — LOSARTAN POTASSIUM 25 MG PO TABS: 25.0000 mg | ORAL_TABLET | Freq: Every day | ORAL | 0 refills | Status: DC

## 2017-09-06 NOTE — Patient Instructions (Addendum)
Stop Lisinopril .  Begin Cozaar 25mg  daily .  Follow up with Primary MD for this in next month .  Begin Mucinex Twice daily As needed  Cough/congestion  Begin Delsym 2 tsp Twice daily  As needed  Cough .  Sips of water to soothe throat , no throat clearing .  NO MINTS.  Begin Prilosec 20mg  daily for 2 weeks .  Chest xray today .  Follow up Dr. Annamaria Boots  In 6 weeks and .As needed   Please contact office for sooner follow up if symptoms do not improve or worsen or seek emergency care

## 2017-09-06 NOTE — Progress Notes (Signed)
@Patient  ID: Brent Gip., male    DOB: October 25, 1945, 71 y.o.   MRN: [id number]  Chief Complaint  Patient presents with  . Follow-up    OSA     Referring provider: Marletta Lor, MD  HPI: 71 yo male followed for OSA   09/06/2017 Follow up : Bronchitis /PNA  Patient presents for a one-month follow-up.  He was recently seen with acute symptoms of cough congestion.  Chest x-ray showed .Bilateral infiltrates.  Patient was treated with antibiotics.  Serial chest x-ray has showed gradual clearing. He was complaining of lingering cough, prednisone was called in but does not seem to help cough .  Patient says he is feeling better has no increased congestion.  He continues to have lingering dry cough.  Feels that he could cough up something but hard to get up.  Cough is keeping him up.  Feels like he is got something irritating his throat. He denies any hemoptysis orthopnea PND or leg swelling. Allergies  Allergen Reactions  . Penicillins Hives and Rash    Immunization History  Administered Date(s) Administered  . Influenza Split 06/13/2011, 07/04/2012  . Influenza Whole 06/24/2008, 06/18/2009, 08/06/2010  . Influenza, High Dose Seasonal PF 07/29/2016, 07/05/2017  . Influenza,inj,Quad PF,6+ Mos 06/14/2013, 05/28/2014, 07/13/2015  . Pneumococcal Conjugate-13 09/22/2014  . Pneumococcal Polysaccharide-23 09/19/2002, 06/13/2011  . Td 09/19/2001  . Tdap 01/21/2014  . Zoster 09/20/2011    Past Medical History:  Diagnosis Date  . Allergy   . CAD (coronary artery disease)    a. angioplasty of his RCA in 1990. b. bare metal stent placed in the RCA in 2000 followed by rotational atherectomy shortly after for stent restenosis. c. last cath was in 2012 showing stable moderate diffuse CAD. (70% mid LAD, 80% diagonal, 70% Ramus, 40% mid to distal RCA stent restenosis). d. Low risk nuc in 2015.  . Diabetes mellitus   . Diverticulosis   . Erectile dysfunction   . Hemorrhoids   . HTN  (hypertension)   . Hyperlipidemia   . Myocardial infarction ([place])   . Obesity   . OSA (obstructive sleep apnea)   . Persistent disorder of initiating or maintaining sleep   . Personal history of colonic polyps 02/05/2003    Tobacco History: Social History   Tobacco Use  Smoking Status Former Smoker  . Packs/day: 1.50  . Years: 30.00  . Pack years: 45.00  . Types: Cigarettes  . Last attempt to quit: 09/19/1980  . Years since quitting: 36.9  Smokeless Tobacco Never Used   Counseling given: Not Answered   Outpatient Encounter Medications as of 09/06/2017  Medication Sig  . aspirin 81 MG tablet Take 81 mg by mouth daily.  . fenofibrate 160 MG tablet Take 160 mg by mouth daily.  . fluticasone (FLONASE) 50 MCG/ACT nasal spray PLACE 2 SPRAYS INTO BOTH NOSTRILS DAILY.  Marland Kitchen insulin aspart (NOVOLOG FLEXPEN) 100 UNIT/ML FlexPen Inject 40 Units 3 (three) times daily with meals into the skin.  Marland Kitchen insulin glargine (LANTUS) 100 unit/mL SOPN Inject 60 Units at bedtime into the skin.  Marland Kitchen lisinopril (PRINIVIL,ZESTRIL) 5 MG tablet Take 5 mg by mouth daily.    Marland Kitchen LORazepam (ATIVAN) 1 MG tablet TAKE 1 TABLET BY MOUTH TWICE A DAY AS NEEDED ANXIETY  . metFORMIN (GLUCOPHAGE) 1000 MG tablet Take 1,000 mg by mouth 2 (two) times daily with a meal.  . Multiple Vitamins-Minerals (CENTRUM SILVER PO) 1 tab po qd   . rosuvastatin (CRESTOR) 20 MG  tablet Take 1 tablet (20 mg total) by mouth daily.  . sertraline (ZOLOFT) 100 MG tablet Take 150 mg by mouth daily. Total of 150mg  daily  . vitamin B-12 (CYANOCOBALAMIN) 1000 MCG tablet Take 1,000 mcg by mouth daily.    Marland Kitchen zolpidem (AMBIEN) 10 MG tablet Take 10 mg by mouth at bedtime as needed. For insomnia  . [DISCONTINUED] doxycycline (VIBRA-TABS) 100 MG tablet Take 2 tablets today then one daily until gone  . [DISCONTINUED] fluconazole (DIFLUCAN) 150 MG tablet Take 1 tablet (150 mg total) by mouth daily.  . [DISCONTINUED] HYDROcodone-homatropine (HYCODAN) 5-1.5 MG/5ML  syrup Take 5 mLs by mouth every 8 (eight) hours as needed for cough.  . [DISCONTINUED] predniSONE (DELTASONE) 10 MG tablet Take 1 tablet (10 mg total) by mouth daily with breakfast.  . losartan (COZAAR) 25 MG tablet Take 1 tablet (25 mg total) by mouth daily.   No facility-administered encounter medications on file as of 09/06/2017.      Review of Systems  Constitutional:   No  weight loss, night sweats,  Fevers, chills,  +fatigue, or  lassitude.  HEENT:   No headaches,  Difficulty swallowing,  Tooth/dental problems, or  Sore throat,                No sneezing, itching, ear ache,  +nasal congestion, post nasal drip,   CV:  No chest pain,  Orthopnea, PND, swelling in lower extremities, anasarca, dizziness, palpitations, syncope.   GI  No heartburn, indigestion, abdominal pain, nausea, vomiting, diarrhea, change in bowel habits, loss of appetite, bloody stools.   Resp:    No chest wall deformity  Skin: no rash or lesions.  GU: no dysuria, change in color of urine, no urgency or frequency.  No flank pain, no hematuria   MS:  No joint pain or swelling.  No decreased range of motion.  No back pain.    Physical Exam  BP 140/64 (BP Location: Left Arm, Cuff Size: Normal)   Pulse 77   Ht 6' (1.829 m)   Wt 232 lb 9.6 oz (105.5 kg)   SpO2 96%   BMI 31.55 kg/m   GEN: A/Ox3; pleasant , NAD, elderly    HEENT:  [place]/AT,  EACs-clear, TMs-wnl, NOSE-clear, THROAT-clear, no lesions, no postnasal drip or exudate noted.   NECK:  Supple w/ fair ROM; no JVD; normal carotid impulses w/o bruits; no thyromegaly or nodules palpated; no lymphadenopathy.    RESP  Clear  P & A; w/o, wheezes/ rales/ or rhonchi. no accessory muscle use, no dullness to percussion  CARD:  RRR, no m/r/g, no peripheral edema, pulses intact, no cyanosis or clubbing.  GI:   Soft & nt; nml bowel sounds; no organomegaly or masses detected.   Musco: Warm bil, no deformities or joint swelling noted.   Neuro: alert, no  focal deficits noted.    Skin: Warm, no lesions or rashes    Lab Results:  CBC   BNP No results found for: BNP  ProBNP    Component Value Date/Time   PROBNP 287 08/09/2017 1024    Imaging: Dg Chest 2 View  Result Date: 09/06/2017 CLINICAL DATA:  Followup pneumonia, persistent cough and congestion, history coronary artery disease post stenting, hypertension, diabetes mellitus EXAM: CHEST  2 VIEW COMPARISON:  08/21/2017, 08/08/2017 FINDINGS: Minimal enlargement of cardiac silhouette. Mediastinal contours and pulmonary vascularity normal. Mild central peribronchial thickening. Accentuation of interstitial markings in the mid to lower lungs, slightly improved since 08/21/2017. No new areas of  consolidation, pleural effusion, or pneumothorax. Bones demineralized. Mild atherosclerotic calcification thoracic aorta. IMPRESSION: Bronchitic changes with continued improvement in interstitial infiltrates at lower lungs. Electronically Signed   By: Lavonia Dana M.D.   On: 09/06/2017 12:49   Dg Chest 2 View  Result Date: 08/21/2017 CLINICAL DATA:  71 year old male with cough, dyspnea and congestion returns for follow-up on pneumonic consolidations. EXAM: CHEST  2 VIEW COMPARISON:  08/08/2017, 07/03/2015 FINDINGS: Stable mild cardiomegaly with aortic atherosclerosis. Chronic bronchitic change is noted of the lungs with near complete resolution of previously noted multifocal pneumonic consolidations in both upper lobes and left lung base. Two tiny nodular opacities in the left upper lobe may represent branch points for pulmonary vessels or tiny nodules measuring 4 mm or less. No acute osseous appearing abnormality. IMPRESSION: Near complete resolution bilateral airspace opacities with chronic bronchitic change currently noted. Two tiny nodular densities in the left upper lobe are demonstrated after clearance of pneumonic consolidations, the more cephalad can be identified dating back to 2016. Findings or  likely to represent benign findings. Electronically Signed   By: Ashley Royalty M.D.   On: 08/21/2017 15:21     Assessment & Plan:   CAP (community acquired pneumonia) Slowly resolving PNA -clinically pt w/ residual cough  ACE inibitor may be aggravating cough .      Plan  Patient Instructions  Stop Lisinopril .  Begin Cozaar 25mg  daily .  Follow up with Primary MD for this in next month .  Begin Mucinex Twice daily As needed  Cough/congestion  Begin Delsym 2 tsp Twice daily  As needed  Cough .  Sips of water to soothe throat , no throat clearing .  NO MINTS.  Begin Prilosec 20mg  daily for 2 weeks .  Chest xray today .  Follow up Dr. Annamaria Boots  In 6 weeks and .As needed   Please contact office for sooner follow up if symptoms do not improve or worsen or seek emergency care          Rexene Edison, NP

## 2017-09-08 DIAGNOSIS — J189 Pneumonia, unspecified organism: Secondary | ICD-10-CM | POA: Insufficient documentation

## 2017-09-08 NOTE — Assessment & Plan Note (Signed)
Slowly resolving PNA -clinically pt w/ residual cough  ACE inibitor may be aggravating cough .      Plan  Patient Instructions  Stop Lisinopril .  Begin Cozaar 25mg  daily .  Follow up with Primary MD for this in next month .  Begin Mucinex Twice daily As needed  Cough/congestion  Begin Delsym 2 tsp Twice daily  As needed  Cough .  Sips of water to soothe throat , no throat clearing .  NO MINTS.  Begin Prilosec 20mg  daily for 2 weeks .  Chest xray today .  Follow up Dr. Annamaria Boots  In 6 weeks and .As needed   Please contact office for sooner follow up if symptoms do not improve or worsen or seek emergency care

## 2017-09-20 ENCOUNTER — Ambulatory Visit (INDEPENDENT_AMBULATORY_CARE_PROVIDER_SITE_OTHER): Payer: Medicare Other | Admitting: Cardiology

## 2017-09-20 ENCOUNTER — Encounter: Payer: Self-pay | Admitting: Cardiology

## 2017-09-20 VITALS — BP 130/64 | HR 72 | Ht 72.0 in | Wt 231.4 lb

## 2017-09-20 DIAGNOSIS — I2510 Atherosclerotic heart disease of native coronary artery without angina pectoris: Secondary | ICD-10-CM

## 2017-09-20 NOTE — Progress Notes (Signed)
09/20/2017 Hermann Dottavio   10-24-1945  [id number]  Primary Physician Marletta Lor, MD Primary Cardiologist: Dr. Angelena Form   Reason for Visit/CC: f/u for dyspnea  HPI:  Jeryl Umholtz. is a 72 y.o. male who is being seen today for f/u given recent symptoms of dyspnea and cough. He is a former pt of Dr. Lia Foyer and now followed by Dr. Angelena Form. He has a h/o CAD s/p  angioplasty of his RCA in 1990. He had a bare metal stent placed in the RCA in 2000 followed by rotational atherectomy shortly after for stent restenosis. Last cath in May 2012 per Dr. Lia Foyer with stable moderate diffuse CAD (70% mid LAD, 80% diagonal, 70% Ramus, 40% mid to distal RCA stent restenosis). Stress test in 2015 showed no ischemia. Other medical conditions include DM, HLD and OSA.   He was recently seen by his pulmonologist for dyspnea and was felt to have PNA based on CXR. CXR also showed cardiomegaly. He was placed on antibiotics and referred back to our office for further w/u for possible CHF. He was seen by Melina Copa, PA-C, on 08/09/17. BNP was obtained and was WNL. CBC showed normal hgb at 14. 2D echo was also ordered and showed normal LVEF at 55-60% with G1DD and trace MR and TR.   He presents back to clinic today for f/u. He continues to have a mild residual cough but his dyspnea has resolved with antibiotics. He has f/u with Dr. Annamaria Boots soon. He denies exertional dyspnea. No chest pain. BP is well controlled.    2D Echo 08/18/17 Study Conclusions  - Left ventricle: The cavity size was normal. Wall thickness was   normal. Systolic function was normal. The estimated ejection   fraction was in the range of 55% to 60%. Wall motion was normal;   there were no regional wall motion abnormalities. Doppler   parameters are consistent with abnormal left ventricular   relaxation (grade 1 diastolic dysfunction).  Impressions:  - Normal LV systolic function; mild diastolic dysfunction; trace MR   and  TR.  Current Meds  Medication Sig  . aspirin 81 MG tablet Take 81 mg by mouth daily.  . fenofibrate 160 MG tablet Take 160 mg by mouth daily.  . fluticasone (FLONASE) 50 MCG/ACT nasal spray PLACE 2 SPRAYS INTO BOTH NOSTRILS DAILY.  Marland Kitchen insulin aspart (NOVOLOG FLEXPEN) 100 UNIT/ML FlexPen Inject 40 Units 3 (three) times daily with meals into the skin.  Marland Kitchen insulin glargine (LANTUS) 100 unit/mL SOPN Inject 60 Units at bedtime into the skin.  Marland Kitchen LORazepam (ATIVAN) 1 MG tablet TAKE 1 TABLET BY MOUTH TWICE A DAY AS NEEDED ANXIETY  . losartan (COZAAR) 25 MG tablet Take 1 tablet (25 mg total) by mouth daily.  . metFORMIN (GLUCOPHAGE) 1000 MG tablet Take 1,000 mg by mouth 2 (two) times daily with a meal.  . Multiple Vitamins-Minerals (CENTRUM SILVER PO) 1 tab po qd   . oxybutynin (DITROPAN) 5 MG tablet Take 1 tablet by mouth daily.  . rosuvastatin (CRESTOR) 20 MG tablet Take 1 tablet (20 mg total) by mouth daily.  . sertraline (ZOLOFT) 100 MG tablet Take 150 mg by mouth daily. Total of 150mg  daily  . vitamin B-12 (CYANOCOBALAMIN) 1000 MCG tablet Take 1,000 mcg by mouth daily.    Marland Kitchen zolpidem (AMBIEN) 10 MG tablet Take 10 mg by mouth at bedtime as needed. For insomnia   Allergies  Allergen Reactions  . Penicillins Hives and Rash   Past  Medical History:  Diagnosis Date  . Allergy   . CAD (coronary artery disease)    a. angioplasty of his RCA in 1990. b. bare metal stent placed in the RCA in 2000 followed by rotational atherectomy shortly after for stent restenosis. c. last cath was in 2012 showing stable moderate diffuse CAD. (70% mid LAD, 80% diagonal, 70% Ramus, 40% mid to distal RCA stent restenosis). d. Low risk nuc in 2015.  . Diabetes mellitus   . Diverticulosis   . Erectile dysfunction   . Hemorrhoids   . HTN (hypertension)   . Hyperlipidemia   . Myocardial infarction ([place])   . Obesity   . OSA (obstructive sleep apnea)   . Persistent disorder of initiating or maintaining sleep   .  Personal history of colonic polyps 02/05/2003   Family History  Problem Relation Age of Onset  . Liver cancer Father   . Lung cancer Father   . Heart disease Father   . Heart disease Sister   . Lung cancer Mother   . COPD Mother   . Obesity Mother   . Colon cancer Neg Hx    Past Surgical History:  Procedure Laterality Date  . CHOLECYSTECTOMY    . CORONARY ANGIOPLASTY    . CORONARY STENT PLACEMENT     stenting of the right coronary artery with followup rotational  atherectomy.   Marland Kitchen FINGER SURGERY     right  . FOOT SURGERY     right  . INGUINAL HERNIA REPAIR     right  . ORTHOPEDIC SURGERY     foot right  . rotator cuff surg     Bil   Social History   Socioeconomic History  . Marital status: Married    Spouse name: Not on file  . Number of children: Not on file  . Years of education: Not on file  . Highest education level: Not on file  Social Needs  . Financial resource strain: Not on file  . Food insecurity - worry: Not on file  . Food insecurity - inability: Not on file  . Transportation needs - medical: Not on file  . Transportation needs - non-medical: Not on file  Occupational History  . Not on file  Tobacco Use  . Smoking status: Former Smoker    Packs/day: 1.50    Years: 30.00    Pack years: 45.00    Types: Cigarettes    Last attempt to quit: 09/19/1980    Years since quitting: 37.0  . Smokeless tobacco: Never Used  Substance and Sexual Activity  . Alcohol use: Yes    Alcohol/week: 4.2 - 8.4 oz    Types: 7 - 14 Shots of liquor per week  . Drug use: No  . Sexual activity: Not on file  Other Topics Concern  . Not on file  Social History Narrative  . Not on file     Review of Systems: General: negative for chills, fever, night sweats or weight changes.  Cardiovascular: negative for chest pain, dyspnea on exertion, edema, orthopnea, palpitations, paroxysmal nocturnal dyspnea or shortness of breath Dermatological: negative for rash Respiratory:  negative for cough or wheezing Urologic: negative for hematuria Abdominal: negative for nausea, vomiting, diarrhea, bright red blood per rectum, melena, or hematemesis Neurologic: negative for visual changes, syncope, or dizziness All other systems reviewed and are otherwise negative except as noted above.   Physical Exam:  Blood pressure 130/64, pulse 72, height 6' (1.829 m), weight 231 lb 6.4 oz (105  kg), SpO2 98 %.  General appearance: alert, cooperative and no distress Neck: no carotid bruit and no JVD Lungs: clear to auscultation bilaterally Heart: regular rate and rhythm, S1, S2 normal, no murmur, click, rub or gallop Extremities: extremities normal, atraumatic, no cyanosis or edema Pulses: 2+ and symmetric Skin: Skin color, texture, turgor normal. No rashes or lesions Neurologic: Grossly normal  EKG not performed -- personally reviewed   ASSESSMENT AND PLAN:   1. Dyspnea: resolved after treatment of PNA w/ antibiotics. He denies any further dyspnea. No chest pain. BNP and CBC were WNL. 2D echo with normal LVEF, G1DD and mild MR and TR. No further cardiac w/u at this time.   2. CAD: stable w/o anginal symptoms. Continue ASA and statin therapy.   3. DM: followed by endocrinology.   4. HLD: on statin therapy with Crestor. Lipids followed by PCP.   5. OSA: compliant with CPAP. Followed by Dr. Annamaria Boots.   6. PNA: clinical improvement after course of antibiotics.   7. MR/TR: mild by echo 08/18/17.    Follow-Up w/ Dr. Angelena Form in 6 months  Brittainy Ladoris Gene, MHS [facility] 09/20/2017 8:08 AM

## 2017-09-20 NOTE — Patient Instructions (Addendum)
Medication Instructions:  Your physician recommends that you continue on your current medications as directed. Please refer to the Current Medication list given to you today.   Labwork: None Ordered   Testing/Procedures: None Ordered   Follow-Up: Your physician recommends that you return for a follow-up appointment on February 19, 2018 at 2:00 pm.  Please arrive by 1:45 pm in order to get registered.    If you need a refill on your cardiac medications before your next appointment, please call your pharmacy.   Thank you for choosing [facility]! Christen Bame, RN [phone]

## 2017-10-03 ENCOUNTER — Telehealth (INDEPENDENT_AMBULATORY_CARE_PROVIDER_SITE_OTHER): Payer: Self-pay | Admitting: Physical Medicine and Rehabilitation

## 2017-10-04 DIAGNOSIS — L820 Inflamed seborrheic keratosis: Secondary | ICD-10-CM | POA: Diagnosis not present

## 2017-10-04 DIAGNOSIS — L570 Actinic keratosis: Secondary | ICD-10-CM | POA: Diagnosis not present

## 2017-10-04 DIAGNOSIS — L821 Other seborrheic keratosis: Secondary | ICD-10-CM | POA: Diagnosis not present

## 2017-10-04 NOTE — Telephone Encounter (Signed)
Scheduled for 1/29 for [place] an [id number]. Medicare and ?[employer]. ?precert- none required last time.

## 2017-10-04 NOTE — Telephone Encounter (Signed)
If last injection helped greatly then ok to repeat and I will talk to him, if no relief then MRI Lspine.

## 2017-10-05 NOTE — Telephone Encounter (Signed)
No precert required 

## 2017-10-17 ENCOUNTER — Encounter (INDEPENDENT_AMBULATORY_CARE_PROVIDER_SITE_OTHER): Payer: Self-pay | Admitting: Physical Medicine and Rehabilitation

## 2017-10-17 ENCOUNTER — Ambulatory Visit (INDEPENDENT_AMBULATORY_CARE_PROVIDER_SITE_OTHER): Payer: Medicare Other

## 2017-10-17 ENCOUNTER — Ambulatory Visit (INDEPENDENT_AMBULATORY_CARE_PROVIDER_SITE_OTHER): Payer: Medicare Other | Admitting: Physical Medicine and Rehabilitation

## 2017-10-17 VITALS — BP 143/73 | HR 73 | Temp 98.2°F

## 2017-10-17 DIAGNOSIS — M47816 Spondylosis without myelopathy or radiculopathy, lumbar region: Secondary | ICD-10-CM

## 2017-10-17 DIAGNOSIS — M5416 Radiculopathy, lumbar region: Secondary | ICD-10-CM

## 2017-10-17 DIAGNOSIS — M25551 Pain in right hip: Secondary | ICD-10-CM | POA: Diagnosis not present

## 2017-10-17 DIAGNOSIS — I2510 Atherosclerotic heart disease of native coronary artery without angina pectoris: Secondary | ICD-10-CM | POA: Diagnosis not present

## 2017-10-17 DIAGNOSIS — R202 Paresthesia of skin: Secondary | ICD-10-CM | POA: Diagnosis not present

## 2017-10-17 MED ORDER — METHYLPREDNISOLONE ACETATE 80 MG/ML IJ SUSP: 80.0000 mg | Freq: Once | INTRAMUSCULAR | Status: DC

## 2017-10-17 NOTE — Patient Instructions (Signed)

## 2017-10-17 NOTE — Progress Notes (Signed)
Brent [name]. - 72 y.o. male MRN [id number]  Date of birth: 1946/04/15  Office Visit Note: Visit Date: 10/17/2017 PCP: Marletta Lor, MD Referred by: Marletta Lor, MD  Subjective: Chief Complaint  Patient presents with  . Right Hip - Pain  . Right Leg - Numbness  . Lower Back - Pain   HPI: Brent [name] is a very pleasant 72 year old gentleman who comes in today with his wife who provides some of the history. I saw him  initially in March of this year with chronic worsening severe right low back pain and some referral to the hip. X-rays revealed some degenerative disc changes as well as facet arthropathy. We felt like his pain was probably facet joint mediated low back pain. At the time he had been doing some better while taking ibuprofen. He is taking 600 mg up to 2 times a day. His history is complicated by diabetes, chronic kidney disease, hypertension and coronary artery disease. These make it problematic to keep him on a nonsteroidal anti-inflammatory.  Initially a short course of 800 mg of ibuprofen 3 times per day just for a couple weeks was provided he actually did fairly well.  We ultimately did see him back in May for similar symptoms and completed a diagnostic and therapeutic right L4-5 and L5-S1 facet joint block.  He reports significant relief with that injection up until just recently. He describes the pain is in the right low back and posterior lateral hip with what he refers to is some numbness in the posterior lateral buttock and hamstring region.  He does not have anything down the leg past the knee.  He does not have any groin pain.  He says that sitting and standing will make the pain worse and sometimes just sitting for a length of time will make the pain worse.  Cannot find a position to lay and it does not really limit what he can do but he knows it is there most days.  Very pleased with the injection.  He does not have any red flag symptoms of bowel or  bladder changes or focal weakness.  He has had no new trauma.    Review of Systems  Constitutional: Negative for chills, fever, malaise/fatigue and weight loss.  HENT: Negative for hearing loss and sinus pain.   Eyes: Negative for blurred vision, double vision and photophobia.  Respiratory: Negative for cough and shortness of breath.   Cardiovascular: Negative for chest pain, palpitations and leg swelling.  Gastrointestinal: Negative for abdominal pain, nausea and vomiting.  Genitourinary: Negative for flank pain.  Musculoskeletal: Positive for back pain and joint pain. Negative for myalgias.  Skin: Negative for itching and rash.  Neurological: Positive for tingling. Negative for tremors, focal weakness and weakness.  Endo/Heme/Allergies: Negative.   Psychiatric/Behavioral: Negative for depression.  All other systems reviewed and are negative.  Otherwise per HPI.  Assessment & Plan: Visit Diagnoses:  1. Spondylosis without myelopathy or radiculopathy, lumbar region   2. Paresthesia of skin   3. Lumbar radiculopathy   4. Pain in right hip     Plan: Findings:  Chronic worsening right hip pain which is posterior with a feeling of numbness or paresthesia but is really proximal.  Facet joint block done in May 2018 gave him substantial relief and seemed to help for many months and really just worsened over the last few weeks.  He reports similar symptoms as before with no real changes.  I do think  it would be reasonable to complete an injection of the facet joints again diagnostically from a double block paradigm.  It would be problematic for him to stay on anti-inflammatories with his medical conditions.  The next step depending on how long the injection were to help would be MRI of the lumbar spine to rule out any stenosis.  He does not really have radicular complaints other than this weird sensation of the buttocks.  Evaluate his hips and he does have very stiff hips bilaterally but no  reproduction of pain.  Regrouping physical therapy would be beneficial when confirmative diagnosis is made.    Meds & Orders:  Meds ordered this encounter  Medications  . methylPREDNISolone acetate (DEPO-MEDROL) injection 80 mg    Orders Placed This Encounter  Procedures  . Facet Injection  . XR C-ARM NO REPORT    Follow-up: Return if symptoms worsen or fail to improve, for Consider lumbar spine MRI.   Procedures: No procedures performed  Lumbar Facet Joint Intra-Articular Injection(s) with Fluoroscopic Guidance  Patient: Brent [name].      Date of Birth: 02-08-1946 MRN: [id number] PCP: Marletta Lor, MD      Visit Date: 10/17/2017   Universal Protocol:    Date/Time: 10/17/2017  Consent Given By: the patient  Position: PRONE   Additional Comments: Vital signs were monitored before and after the procedure. Patient was prepped and draped in the usual sterile fashion. The correct patient, procedure, and site was verified.   Injection Procedure Details:  Procedure Site One Meds Administered:  Meds ordered this encounter  Medications  . methylPREDNISolone acetate (DEPO-MEDROL) injection 80 mg     Laterality: Right  Location/Site:  L4-L5 L5-S1  Needle size: 22 guage  Needle type: Spinal  Needle Placement: Articular  Findings:  -Comments: Excellent flow of contrast producing a partial arthrogram.  Procedure Details: The fluoroscope beam is vertically oriented in AP, and the inferior recess is visualized beneath the lower pole of the inferior apophyseal process, which represents the target point for needle insertion. When direct visualization is difficult the target point is located at the medial projection of the vertebral pedicle. The region overlying each aforementioned target is locally anesthetized with a 1 to 2 ml. volume of 1% Lidocaine without Epinephrine.   The spinal needle was inserted into each of the above mentioned facet joints using  biplanar fluoroscopic guidance. A 0.25 to 0.5 ml. volume of Isovue-250 was injected and a partial facet joint arthrogram was obtained. A single spot film was obtained of the resulting arthrogram.    One to 1.25 ml of the steroid/anesthetic solution was then injected into each of the facet joints noted above.   Additional Comments:  The patient tolerated the procedure well Dressing: Band-Aid    Post-procedure details: Patient was observed during the procedure. Post-procedure instructions were reviewed.  Patient left the clinic in stable condition.  Pertinent Imaging: No specialty comments available.      Clinical History: No specialty comments available.  He reports that he quit smoking about 37 years ago. His smoking use included cigarettes. He has a 45.00 pack-year smoking history. he has never used smokeless tobacco.  Recent Labs    11/01/16 1353  HGBA1C 7.0    Objective:  VS:  HT:    WT:   BMI:     BP:(!) 143/73  HR:73bpm  TEMP:98.2 F (36.8 C)(Oral)  RESP:96 % Physical Exam  Constitutional: He is oriented to person, place, and time. He appears  well-developed and well-nourished. No distress.  HENT:  Head: Normocephalic and atraumatic.  Eyes: Conjunctivae are normal. Pupils are equal, round, and reactive to light.  Neck: Normal range of motion. Neck supple.  Cardiovascular: Regular rhythm and intact distal pulses.  Pulmonary/Chest: Effort normal. No respiratory distress.  Musculoskeletal:  Patient is somewhat slow to rise from seated position and does have concordant back pain and hip pain with extension rotation of the lumbar spine to the right.  No pain over the greater trochanters.  He has stiffness with both hips right more than left and does lack some range of motion but does not reproduce any pain in the groin or posterior buttock.  He has good strength in both legs proximal and distal and symmetric.  He has no clonus.  Neurological: He is alert and oriented to  person, place, and time. He exhibits normal muscle tone. Coordination normal.  Skin: Skin is warm and dry. No rash noted. No erythema.  Psychiatric: He has a normal mood and affect.  Nursing note and vitals reviewed.   Ortho Exam Imaging: Xr C-arm No Report  Result Date: 10/17/2017 Please see Notes or Procedures tab for imaging impression.   Past Medical/Family/Surgical/Social History: Medications & Allergies reviewed per EMR Patient Active Problem List   Diagnosis Date Noted  . CAP (community acquired pneumonia) 09/08/2017  . Viral URI with cough 08/02/2017  . Insomnia 08/07/2015  . Benign neoplasm of colon 08/29/2011  . DEPRESSION, SITUATIONAL, ACUTE 01/23/2010  . Essential hypertension 05/30/2009  . Coronary atherosclerosis 05/30/2009  . Obstructive sleep apnea 11/28/2008  . OBESITY 09/23/2008  . ERECTILE DYSFUNCTION 11/26/2007  . Well controlled type 2 diabetes mellitus with peripheral circulatory disorder ([place]) 05/09/2007  . Hyperlipidemia 05/09/2007  . MYOCARDIAL INFARCTION, HX OF 05/09/2007  . DIVERTICULOSIS, COLON 05/09/2007   Past Medical History:  Diagnosis Date  . Allergy   . CAD (coronary artery disease)    a. angioplasty of his RCA in 1990. b. bare metal stent placed in the RCA in 2000 followed by rotational atherectomy shortly after for stent restenosis. c. last cath was in 2012 showing stable moderate diffuse CAD. (70% mid LAD, 80% diagonal, 70% Ramus, 40% mid to distal RCA stent restenosis). d. Low risk nuc in 2015.  . Diabetes mellitus   . Diverticulosis   . Erectile dysfunction   . Hemorrhoids   . HTN (hypertension)   . Hyperlipidemia   . Myocardial infarction ([place])   . Obesity   . OSA (obstructive sleep apnea)   . Persistent disorder of initiating or maintaining sleep   . Personal history of colonic polyps 02/05/2003   Family History  Problem Relation Age of Onset  . Liver cancer Father   . Lung cancer Father   . Heart disease Father   . Heart  disease Sister   . Lung cancer Mother   . COPD Mother   . Obesity Mother   . Colon cancer Neg Hx    Past Surgical History:  Procedure Laterality Date  . CHOLECYSTECTOMY    . CORONARY ANGIOPLASTY    . CORONARY STENT PLACEMENT     stenting of the right coronary artery with followup rotational  atherectomy.   Marland Kitchen FINGER SURGERY     right  . FOOT SURGERY     right  . INGUINAL HERNIA REPAIR     right  . ORTHOPEDIC SURGERY     foot right  . rotator cuff surg     Bil   Social History  Occupational History  . Not on file  Tobacco Use  . Smoking status: Former Smoker    Packs/day: 1.50    Years: 30.00    Pack years: 45.00    Types: Cigarettes    Last attempt to quit: 09/19/1980    Years since quitting: 37.1  . Smokeless tobacco: Never Used  Substance and Sexual Activity  . Alcohol use: Yes    Alcohol/week: 4.2 - 8.4 oz    Types: 7 - 14 Shots of liquor per week  . Drug use: No  . Sexual activity: Not on file

## 2017-10-17 NOTE — Progress Notes (Deleted)
Pt states pain in right hip with numbness in right leg. Pt states pain has been at its worse for the past month. Pt states last injection 02/08/17 helped out a lot and lasted until a month ago. Pt states sitting makes pain worse, laying in a comfortable position eases pain. -Driver, -BT, -Dye Allergies.

## 2017-10-18 ENCOUNTER — Ambulatory Visit: Payer: Medicare Other | Admitting: Internal Medicine

## 2017-10-18 ENCOUNTER — Encounter (INDEPENDENT_AMBULATORY_CARE_PROVIDER_SITE_OTHER): Payer: Self-pay | Admitting: Physical Medicine and Rehabilitation

## 2017-10-18 NOTE — Procedures (Signed)
Lumbar Facet Joint Intra-Articular Injection(s) with Fluoroscopic Guidance  Patient: Brent [name].      Date of Birth: 25-Oct-1945 MRN: [id number] PCP: Marletta Lor, MD      Visit Date: 10/17/2017   Universal Protocol:    Date/Time: 10/17/2017  Consent Given By: the patient  Position: PRONE   Additional Comments: Vital signs were monitored before and after the procedure. Patient was prepped and draped in the usual sterile fashion. The correct patient, procedure, and site was verified.   Injection Procedure Details:  Procedure Site One Meds Administered:  Meds ordered this encounter  Medications  . methylPREDNISolone acetate (DEPO-MEDROL) injection 80 mg     Laterality: Right  Location/Site:  L4-L5 L5-S1  Needle size: 22 guage  Needle type: Spinal  Needle Placement: Articular  Findings:  -Comments: Excellent flow of contrast producing a partial arthrogram.  Procedure Details: The fluoroscope beam is vertically oriented in AP, and the inferior recess is visualized beneath the lower pole of the inferior apophyseal process, which represents the target point for needle insertion. When direct visualization is difficult the target point is located at the medial projection of the vertebral pedicle. The region overlying each aforementioned target is locally anesthetized with a 1 to 2 ml. volume of 1% Lidocaine without Epinephrine.   The spinal needle was inserted into each of the above mentioned facet joints using biplanar fluoroscopic guidance. A 0.25 to 0.5 ml. volume of Isovue-250 was injected and a partial facet joint arthrogram was obtained. A single spot film was obtained of the resulting arthrogram.    One to 1.25 ml of the steroid/anesthetic solution was then injected into each of the facet joints noted above.   Additional Comments:  The patient tolerated the procedure well Dressing: Band-Aid    Post-procedure details: Patient was observed during the  procedure. Post-procedure instructions were reviewed.  Patient left the clinic in stable condition.  Pertinent Imaging: No specialty comments available.

## 2017-10-28 ENCOUNTER — Other Ambulatory Visit: Payer: Self-pay | Admitting: Adult Health

## 2017-11-29 ENCOUNTER — Other Ambulatory Visit: Payer: Self-pay | Admitting: Adult Health

## 2018-01-24 ENCOUNTER — Other Ambulatory Visit: Payer: Self-pay | Admitting: Podiatry

## 2018-01-24 ENCOUNTER — Ambulatory Visit (INDEPENDENT_AMBULATORY_CARE_PROVIDER_SITE_OTHER): Payer: Medicare Other | Admitting: Podiatry

## 2018-01-24 ENCOUNTER — Encounter: Payer: Self-pay | Admitting: Podiatry

## 2018-01-24 ENCOUNTER — Ambulatory Visit (INDEPENDENT_AMBULATORY_CARE_PROVIDER_SITE_OTHER): Payer: Medicare Other

## 2018-01-24 DIAGNOSIS — M779 Enthesopathy, unspecified: Secondary | ICD-10-CM

## 2018-01-24 DIAGNOSIS — M7662 Achilles tendinitis, left leg: Secondary | ICD-10-CM | POA: Diagnosis not present

## 2018-01-24 DIAGNOSIS — M7661 Achilles tendinitis, right leg: Secondary | ICD-10-CM

## 2018-01-24 DIAGNOSIS — M778 Other enthesopathies, not elsewhere classified: Secondary | ICD-10-CM

## 2018-01-24 DIAGNOSIS — R2243 Localized swelling, mass and lump, lower limb, bilateral: Secondary | ICD-10-CM

## 2018-01-24 MED ORDER — MELOXICAM 15 MG PO TABS: 15.0000 mg | ORAL_TABLET | Freq: Every day | ORAL | 1 refills | Status: DC

## 2018-01-24 NOTE — Progress Notes (Signed)
   HPI: 72 year old male presents the office today for evaluation regarding bilateral foot pain.  Patient was recently diagnosed with diabetes mellitus.  He denies injury but states that he has some intermittent chronic pain that has been going on for several months.  He presents today for further treatment evaluation  Past Medical History:  Diagnosis Date  . Allergy   . CAD (coronary artery disease)    a. angioplasty of his RCA in 1990. b. bare metal stent placed in the RCA in 2000 followed by rotational atherectomy shortly after for stent restenosis. c. last cath was in 2012 showing stable moderate diffuse CAD. (70% mid LAD, 80% diagonal, 70% Ramus, 40% mid to distal RCA stent restenosis). d. Low risk nuc in 2015.  . Diabetes mellitus   . Diverticulosis   . Erectile dysfunction   . Hemorrhoids   . HTN (hypertension)   . Hyperlipidemia   . Myocardial infarction ([place])   . Obesity   . OSA (obstructive sleep apnea)   . Persistent disorder of initiating or maintaining sleep   . Personal history of colonic polyps 02/05/2003     Physical Exam: General: The patient is alert and oriented x3 in no acute distress.  Dermatology: Skin is warm, dry and supple bilateral lower extremities. Negative for open lesions or macerations.  Vascular: Palpable pedal pulses bilaterally. No edema or erythema noted. Capillary refill within normal limits.  Neurological: Epicritic and protective threshold grossly intact bilaterally.   Musculoskeletal Exam: Range of motion within normal limits to all pedal and ankle joints bilateral. Muscle strength 5/5 in all groups bilateral.  Diffuse bone spur formation which is palpable noted along the dorsal midfoot of the bilateral feet as well as posterior tubercles to the bilateral calcaneus.  Radiographic Exam:  Normal osseous mineralization.  Diffuse DJD with joint space narrowing noted throughout the joints of the midfoot bilateral.  Posterior heel spurs noted  bilateral.  Bone spur formation noted to the dorsum of the feet bilateral.  No fracture/dislocation/boney destruction.    Assessment: 1.  DJD/capsulitis bilateral feet 2.  Posterior heel spurs   Plan of Care:  1. Patient evaluated. X-Rays reviewed.  2.  Recommend custom molded orthotics.  Prescription was provided for the patient to take to the [place] for authorization. 3.  Prescription for meloxicam 15 mg to take daily as needed 4.  Recommend good supportive shoe gear.  Continue wearing custom molded orthotics 5.  Return to clinic as needed      Edrick Kins, DPM [facility]  Dr. Edrick Kins, DPM    [address], [place] [address]                Office [phone]  Fax [phone]

## 2018-01-25 ENCOUNTER — Telehealth: Payer: Self-pay | Admitting: Podiatry

## 2018-01-25 NOTE — Telephone Encounter (Signed)
Brent [name] with [facility] called requesting office visit notes from date of service 24 Jan 2018 be faxed to her at [phone].

## 2018-02-07 ENCOUNTER — Encounter: Payer: Self-pay | Admitting: Internal Medicine

## 2018-02-07 ENCOUNTER — Ambulatory Visit (INDEPENDENT_AMBULATORY_CARE_PROVIDER_SITE_OTHER): Payer: Medicare Other | Admitting: Internal Medicine

## 2018-02-07 VITALS — BP 142/60 | HR 83 | Temp 98.3°F | Wt 229.0 lb

## 2018-02-07 DIAGNOSIS — E1151 Type 2 diabetes mellitus with diabetic peripheral angiopathy without gangrene: Secondary | ICD-10-CM | POA: Diagnosis not present

## 2018-02-07 DIAGNOSIS — M67449 Ganglion, unspecified hand: Secondary | ICD-10-CM

## 2018-02-07 DIAGNOSIS — I2510 Atherosclerotic heart disease of native coronary artery without angina pectoris: Secondary | ICD-10-CM

## 2018-02-07 NOTE — Progress Notes (Signed)
Subjective:    Patient ID: Brent [name]., male    DOB: 1946-07-04, 72 y.o.   MRN: [id number]  HPI Lab Results  Component Value Date   HGBA1C 7.0 11/01/2016   72 year old patient who is followed primarily at the [facility] system. He has diabetes.  Recent hemoglobin A1c had improved from 7.2-6.7 He presents with a chief complaint of a painful nodule at the base of his Right thumb this has been worsening over the past 2 weeks.  Past Medical History:  Diagnosis Date  . Allergy   . CAD (coronary artery disease)    a. angioplasty of his RCA in 1990. b. bare metal stent placed in the RCA in 2000 followed by rotational atherectomy shortly after for stent restenosis. c. last cath was in 2012 showing stable moderate diffuse CAD. (70% mid LAD, 80% diagonal, 70% Ramus, 40% mid to distal RCA stent restenosis). d. Low risk nuc in 2015.  . Diabetes mellitus   . Diverticulosis   . Erectile dysfunction   . Hemorrhoids   . HTN (hypertension)   . Hyperlipidemia   . Myocardial infarction ([place])   . Obesity   . OSA (obstructive sleep apnea)   . Persistent disorder of initiating or maintaining sleep   . Personal history of colonic polyps 02/05/2003     Social History   Socioeconomic History  . Marital status: Married    Spouse name: Not on file  . Number of children: Not on file  . Years of education: Not on file  . Highest education level: Not on file  Occupational History  . Not on file  Social Needs  . Financial resource strain: Not on file  . Food insecurity:    Worry: Not on file    Inability: Not on file  . Transportation needs:    Medical: Not on file    Non-medical: Not on file  Tobacco Use  . Smoking status: Former Smoker    Packs/day: 1.50    Years: 30.00    Pack years: 45.00    Types: Cigarettes    Last attempt to quit: 09/19/1980    Years since quitting: 37.4  . Smokeless tobacco: Never Used  Substance and Sexual Activity  . Alcohol use: Yes    Alcohol/week:  4.2 - 8.4 oz    Types: 7 - 14 Shots of liquor per week  . Drug use: No  . Sexual activity: Not on file  Lifestyle  . Physical activity:    Days per week: Not on file    Minutes per session: Not on file  . Stress: Not on file  Relationships  . Social connections:    Talks on phone: Not on file    Gets together: Not on file    Attends religious service: Not on file    Active member of club or organization: Not on file    Attends meetings of clubs or organizations: Not on file    Relationship status: Not on file  . Intimate partner violence:    Fear of current or ex partner: Not on file    Emotionally abused: Not on file    Physically abused: Not on file    Forced sexual activity: Not on file  Other Topics Concern  . Not on file  Social History Narrative  . Not on file    Past Surgical History:  Procedure Laterality Date  . CHOLECYSTECTOMY    . CORONARY ANGIOPLASTY    . CORONARY STENT  PLACEMENT     stenting of the right coronary artery with followup rotational  atherectomy.   Marland Kitchen FINGER SURGERY     right  . FOOT SURGERY     right  . INGUINAL HERNIA REPAIR     right  . ORTHOPEDIC SURGERY     foot right  . rotator cuff surg     Bil    Family History  Problem Relation Age of Onset  . Liver cancer Father   . Lung cancer Father   . Heart disease Father   . Heart disease Sister   . Lung cancer Mother   . COPD Mother   . Obesity Mother   . Colon cancer Neg Hx     Allergies  Allergen Reactions  . Penicillins Hives and Rash    Current Outpatient Medications on File Prior to Visit  Medication Sig Dispense Refill  . aspirin 81 MG tablet Take 81 mg by mouth daily.    Marland Kitchen DEPO-TESTOSTERONE 200 MG/ML injection     . fenofibrate 160 MG tablet Take 160 mg by mouth daily.    . fluticasone (FLONASE) 50 MCG/ACT nasal spray PLACE 2 SPRAYS INTO BOTH NOSTRILS DAILY. 48 g 3  . insulin aspart (NOVOLOG FLEXPEN) 100 UNIT/ML FlexPen Inject 40 Units 3 (three) times daily with meals  into the skin.    Marland Kitchen insulin glargine (LANTUS) 100 unit/mL SOPN Inject 60 Units at bedtime into the skin.    Marland Kitchen LISINOPRIL PO Take by mouth.    Marland Kitchen LORazepam (ATIVAN) 1 MG tablet TAKE 1 TABLET BY MOUTH TWICE A DAY AS NEEDED ANXIETY 180 tablet 0  . losartan (COZAAR) 25 MG tablet TAKE 1 TABLET BY MOUTH EVERY DAY 30 tablet 0  . metFORMIN (GLUCOPHAGE) 1000 MG tablet Take 1,000 mg by mouth 2 (two) times daily with a meal.    . Multiple Vitamins-Minerals (CENTRUM SILVER PO) 1 tab po qd     . oxybutynin (DITROPAN) 5 MG tablet Take 1 tablet by mouth daily.    . rosuvastatin (CRESTOR) 20 MG tablet Take 1 tablet (20 mg total) by mouth daily. 90 tablet 3  . sertraline (ZOLOFT) 100 MG tablet Take 150 mg by mouth daily. Total of 150mg  daily    . vitamin B-12 (CYANOCOBALAMIN) 1000 MCG tablet Take 1,000 mcg by mouth daily.      Marland Kitchen zolpidem (AMBIEN) 10 MG tablet Take 10 mg by mouth at bedtime as needed. For insomnia     Current Facility-Administered Medications on File Prior to Visit  Medication Dose Route Frequency Provider Last Rate Last Dose  . methylPREDNISolone acetate (DEPO-MEDROL) injection 80 mg  80 mg Other Once Magnus Sinning, MD        BP (!) 142/60 (BP Location: Right Arm, Patient Position: Sitting, Cuff Size: Large)   Pulse 83   Temp 98.3 F (36.8 C) (Oral)   Wt 229 lb (103.9 kg)   SpO2 97%   BMI 31.06 kg/m      Review of Systems  Constitutional: Negative for appetite change, chills, fatigue and fever.  HENT: Negative for congestion, dental problem, ear pain, hearing loss, sore throat, tinnitus, trouble swallowing and voice change.   Eyes: Negative for pain, discharge and visual disturbance.  Respiratory: Negative for cough, chest tightness, wheezing and stridor.   Cardiovascular: Negative for chest pain, palpitations and leg swelling.  Gastrointestinal: Negative for abdominal distention, abdominal pain, blood in stool, constipation, diarrhea, nausea and vomiting.  Genitourinary:  Negative for difficulty urinating, discharge, flank pain,  genital sores, hematuria and urgency.  Musculoskeletal: Negative for arthralgias, back pain, gait problem, joint swelling, myalgias and neck stiffness.       Painful nodule at the base of the right thumb  Skin: Negative for rash.  Neurological: Negative for dizziness, syncope, speech difficulty, weakness, numbness and headaches.  Hematological: Negative for adenopathy. Does not bruise/bleed easily.  Psychiatric/Behavioral: Negative for behavioral problems and dysphoric mood. The patient is not nervous/anxious.        Objective:   Physical Exam  Constitutional: He is oriented to person, place, and time. He appears well-developed.  HENT:  Head: Normocephalic.  Right Ear: External ear normal.  Left Ear: External ear normal.  Eyes: Conjunctivae and EOM are normal.  Neck: Normal range of motion.  Cardiovascular: Normal rate and normal heart sounds.  Pulmonary/Chest: Breath sounds normal.  Abdominal: Bowel sounds are normal.  Musculoskeletal: Normal range of motion. He exhibits no edema or tenderness.  1 cm slightly tender nodule at the base of the right thumb flexor surface  Neurological: He is alert and oriented to person, place, and time.  Psychiatric: He has a normal mood and affect. His behavior is normal.          Assessment & Plan:   Probable ganglion cyst.  Options discussed.  Hand surgeon referral if unimproved Diabetes stable  [id number],PETER Pilar Plate

## 2018-02-07 NOTE — Patient Instructions (Addendum)
Ganglion Cyst A ganglion cyst is a noncancerous, fluid-filled lump that occurs near joints or tendons. The ganglion cyst grows out of a joint or the lining of a tendon. It most often develops in the hand or wrist, but it can also develop in the shoulder, elbow, hip, knee, ankle, or foot. The round or oval ganglion cyst can be the size of a pea or larger than a grape. Increased activity may enlarge the size of the cyst because more fluid starts to build up. What are the causes? It is not known what causes a ganglion cyst to grow. However, it may be related to:  Inflammation or irritation around the joint.  An injury.  Repetitive movements or overuse.  Arthritis.  What increases the risk? Risk factors include:  Being a woman.  Being age 70-50.  What are the signs or symptoms? Symptoms may include:  A lump. This most often appears on the hand or wrist, but it can occur in other areas of the body.  Tingling.  Pain.  Numbness.  Muscle weakness.  Weak grip.  Less movement in a joint.  How is this diagnosed? Ganglion cysts are most often diagnosed based on a physical exam. Your health care provider will feel the lump and may shine a light alongside it. If it is a ganglion cyst, a light often shines through it. Your health care provider may order an X-ray, ultrasound, or MRI to rule out other conditions. How is this treated? Ganglion cysts usually go away on their own without treatment. If pain or other symptoms are involved, treatment may be needed. Treatment is also needed if the ganglion cyst limits your movement or if it gets infected. Treatment may include:  Wearing a brace or splint on your wrist or finger.  Taking anti-inflammatory medicine.  Draining fluid from the lump with a needle (aspiration).  Injecting a steroid into the joint.  Surgery to remove the ganglion cyst.  Follow these instructions at home:  Do not press on the ganglion cyst, poke it with a  needle, or hit it.  Take medicines only as directed by your health care provider.  Wear your brace or splint as directed by your health care provider.  Watch your ganglion cyst for any changes.  Keep all follow-up visits as directed by your health care provider. This is important. Contact a health care provider if:  Your ganglion cyst becomes larger or more painful.  You have increased redness, red streaks, or swelling.  You have pus coming from the lump.  You have weakness or numbness in the affected area.  You have a fever or chills.   Please call for referral to hand surgeon if unimproved

## 2018-02-19 ENCOUNTER — Ambulatory Visit: Payer: Medicare Other | Admitting: Cardiovascular Disease

## 2018-02-21 ENCOUNTER — Other Ambulatory Visit: Payer: Self-pay | Admitting: Internal Medicine

## 2018-03-20 NOTE — Progress Notes (Signed)
Chief Complaint  Patient presents with  . Follow-up    CAD   History of Present Illness: 72 yo male with history of CAD, HLD, DM, OSA who is here today for cardiac follow up. He has been followed in the past by Dr. Lia Foyer. He had angioplasty of his RCA in 1990. He had a bare metal stent placed in the RCA in 2000 followed by rotational atherectomy shortly after for stent restenosis. Last cath in May 2012 per Dr. Lia Foyer with stable moderate diffuse CAD. (70% mid LAD, 80% diagonal, 70% Ramus, 40% mid to distal RCA stent restenosis). He has done well with medical management. He is seeing an endocrinologist for his DM. He is on CPAP and see Dr. Gwenette Greet. He has had elevated CK on statins. He has been seen in the lipid clinic and was tried on Livalo which also caused elevated CK. He was placed back on Crestor. Stress myoview January 2015 with no ischemia. Echo November 2018 with normal LV systolic function, UDJS=97-02%, grade 1 diastolic dysfunction, no significant valve disease.   He is here today for follow up. The patient denies any chest pain, dyspnea, palpitations, lower extremity edema, orthopnea, PND, dizziness, near syncope or syncope.   Primary Care Physician: Marletta Lor, MD  Past Medical History:  Diagnosis Date  . Allergy   . CAD (coronary artery disease)    a. angioplasty of his RCA in 1990. b. bare metal stent placed in the RCA in 2000 followed by rotational atherectomy shortly after for stent restenosis. c. last cath was in 2012 showing stable moderate diffuse CAD. (70% mid LAD, 80% diagonal, 70% Ramus, 40% mid to distal RCA stent restenosis). d. Low risk nuc in 2015.  . Diabetes mellitus   . Diverticulosis   . Erectile dysfunction   . Hemorrhoids   . HTN (hypertension)   . Hyperlipidemia   . Myocardial infarction ([place])   . Obesity   . OSA (obstructive sleep apnea)   . Persistent disorder of initiating or maintaining sleep   . Personal history of colonic polyps  02/05/2003    Past Surgical History:  Procedure Laterality Date  . CHOLECYSTECTOMY    . CORONARY ANGIOPLASTY    . CORONARY STENT PLACEMENT     stenting of the right coronary artery with followup rotational  atherectomy.   Marland Kitchen FINGER SURGERY     right  . FOOT SURGERY     right  . INGUINAL HERNIA REPAIR     right  . ORTHOPEDIC SURGERY     foot right  . rotator cuff surg     Bil    Current Outpatient Medications  Medication Sig Dispense Refill  . aspirin 81 MG tablet Take 81 mg by mouth daily.    Marland Kitchen DEPO-TESTOSTERONE 200 MG/ML injection     . empagliflozin (JARDIANCE) 25 MG TABS tablet Take 25 mg by mouth daily.    . fenofibrate 160 MG tablet Take 160 mg by mouth daily.    . fluticasone (FLONASE) 50 MCG/ACT nasal spray PLACE 2 SPRAYS INTO BOTH NOSTRILS DAILY. 48 g 3  . insulin aspart (NOVOLOG FLEXPEN) 100 UNIT/ML FlexPen Inject 40 Units 3 (three) times daily with meals into the skin.    Marland Kitchen insulin glargine (LANTUS) 100 unit/mL SOPN Inject 60 Units at bedtime into the skin.    Marland Kitchen lisinopril (PRINIVIL,ZESTRIL) 5 MG tablet Take 5 mg by mouth daily.    Marland Kitchen LORazepam (ATIVAN) 1 MG tablet TAKE 1 TABLET TWICE A DAY  AS NEEDED FOR ANXIETY 180 tablet 0  . metFORMIN (GLUCOPHAGE) 1000 MG tablet Take 1,000 mg by mouth 2 (two) times daily with a meal.    . Multiple Vitamins-Minerals (CENTRUM SILVER PO) 1 tab po qd     . oxybutynin (DITROPAN) 5 MG tablet Take 1 tablet by mouth daily.    . rosuvastatin (CRESTOR) 20 MG tablet Take 1 tablet (20 mg total) by mouth daily. 90 tablet 3  . sertraline (ZOLOFT) 100 MG tablet Take 150 mg by mouth daily. Total of 150mg  daily    . vitamin B-12 (CYANOCOBALAMIN) 1000 MCG tablet Take 1,000 mcg by mouth daily.      Marland Kitchen zolpidem (AMBIEN) 10 MG tablet Take 10 mg by mouth at bedtime as needed. For insomnia     Current Facility-Administered Medications  Medication Dose Route Frequency Provider Last Rate Last Dose  . methylPREDNISolone acetate (DEPO-MEDROL) injection 80 mg   80 mg Other Once Magnus Sinning, MD        Allergies  Allergen Reactions  . Penicillins Hives and Rash    Social History   Socioeconomic History  . Marital status: Married    Spouse name: Not on file  . Number of children: Not on file  . Years of education: Not on file  . Highest education level: Not on file  Occupational History  . Not on file  Social Needs  . Financial resource strain: Not on file  . Food insecurity:    Worry: Not on file    Inability: Not on file  . Transportation needs:    Medical: Not on file    Non-medical: Not on file  Tobacco Use  . Smoking status: Former Smoker    Packs/day: 1.50    Years: 30.00    Pack years: 45.00    Types: Cigarettes    Last attempt to quit: 09/19/1980    Years since quitting: 37.5  . Smokeless tobacco: Never Used  Substance and Sexual Activity  . Alcohol use: Yes    Alcohol/week: 4.2 - 8.4 oz    Types: 7 - 14 Shots of liquor per week  . Drug use: No  . Sexual activity: Not on file  Lifestyle  . Physical activity:    Days per week: Not on file    Minutes per session: Not on file  . Stress: Not on file  Relationships  . Social connections:    Talks on phone: Not on file    Gets together: Not on file    Attends religious service: Not on file    Active member of club or organization: Not on file    Attends meetings of clubs or organizations: Not on file    Relationship status: Not on file  . Intimate partner violence:    Fear of current or ex partner: Not on file    Emotionally abused: Not on file    Physically abused: Not on file    Forced sexual activity: Not on file  Other Topics Concern  . Not on file  Social History Narrative  . Not on file    Family History  Problem Relation Age of Onset  . Liver cancer Father   . Lung cancer Father   . Heart disease Father   . Heart disease Sister   . Lung cancer Mother   . COPD Mother   . Obesity Mother   . Colon cancer Neg Hx     Review of Systems:  As  stated in the  HPI and otherwise negative.   BP (!) 138/58   Pulse 95   Ht 6' (1.829 m)   Wt 229 lb (103.9 kg)   SpO2 90%   BMI 31.06 kg/m   Physical Examination:  General: Well developed, well nourished, NAD  HEENT: OP clear, mucus membranes moist  SKIN: warm, dry. No rashes. Neuro: No focal deficits  Musculoskeletal: Muscle strength 5/5 all ext  Psychiatric: Mood and affect normal  Neck: No JVD, no carotid bruits, no thyromegaly, no lymphadenopathy.  Lungs:Clear bilaterally, no wheezes, rhonci, crackles Cardiovascular: Regular rate and rhythm. No murmurs, gallops or rubs. Abdomen:Soft. Bowel sounds present. Non-tender.  Extremities: No lower extremity edema. Pulses are 2 + in the bilateral DP/PT.  Cardiac cath 02/06/11: 1. Ventriculography done in the RAO projection reveals preserved  global systolic function. No definite segmental wall motion  abnormalities are identified. There may be trace mitral  regurgitation, but none of hemodynamic significance.  2. The left main is slightly tapered, but without significant focal  narrowing.  3. The LAD is fairly heavily calcified. There is an eccentric plaque  of approximately 75% noted in the midvessel. This is just after  the septal and overlying the takeoff of the small first diagonal.  The small first diagonal has about 80% narrowing, unchanged from  the previous study. The mid and distal LAD has about 50%  narrowing, is calcified, but is largely unchanged from prior  studies and the vessel wraps the apex.  4. The circumflex is a large-caliber vessel. There is a ramus  intermedius that has 70-75% proximal segmental plaque. The vessel  beyond this is widely patent. When compared carefully to the  previous study, there does not appear to be a substantial change.  The AV circumflex is a large-caliber vessel without significant  focal narrowing. It is also calcified.  5. The right coronary artery has been previously stented. There  is a  long stent in the mid vessel extending down to the distal vessel  overlapping the PDA takeoff, and extending into the posterolateral  segment across the first PLL-1. The stent has been previously  opened, then subsequently rotobladed. The vessel remains widely  patent with about 40% diffuse in-stent re-narrowing, but without  significant focal narrowing.  CONCLUSION:  1. Well-preserved left ventricular systolic function.  2. Continued 70-75% eccentric stenosis of the left anterior descending  artery, largely unchanged from 2003.  3. 70-75% proximal ramus intermedius stenosis, again appears unchanged  from 2003.  4. Prior extensive stenting of the right coronary artery with  subsequent treatment with rotational atherectomy with continued  patency.  EKG:  EKG is not ordered today. The ekg ordered today demonstrates   Recent Labs: 08/09/2017: BUN 16; Creatinine, Ser 0.87; NT-Pro BNP 287; Potassium 4.3; Sodium 138; TSH 2.510 08/21/2017: Hemoglobin 14.1; Platelets 453.0   Lipid Panel    Component Value Date/Time   CHOL 153 04/15/2016 1133   TRIG (H) 04/15/2016 1133    449.0 Triglyceride is over 400; calculations on Lipids are invalid.   HDL 27.40 (L) 04/15/2016 1133   CHOLHDL 6 04/15/2016 1133   VLDL 54.8 (H) 03/18/2015 0833   LDLCALC  02/14/2011 0147    25        Total Cholesterol/HDL:CHD Risk Coronary Heart Disease Risk Table                     Men   Women  1/2 Average Risk   3.4   3.3  Average Risk  5.0   4.4  2 X Average Risk   9.6   7.1  3 X Average Risk  23.4   11.0        Use the calculated Patient Ratio above and the CHD Risk Table to determine the patient's CHD Risk.        ATP III CLASSIFICATION (LDL):  <100     mg/dL   Optimal  100-129  mg/dL   Near or Above                    Optimal  130-159  mg/dL   Borderline  160-189  mg/dL   High  >190     mg/dL   Very High   LDLDIRECT 77.0 04/15/2016 1133     Wt Readings from Last 3 Encounters:    03/23/18 229 lb (103.9 kg)  02/07/18 229 lb (103.9 kg)  09/20/17 231 lb 6.4 oz (105 kg)     Other studies Reviewed: Additional studies/ records that were reviewed today include: . Review of the above records demonstrates:    Assessment and Plan:   1. CAD without angina: He has moderate CAD by last cath 2012. He has done well with medical management of his CAD. Echo November 2018 with normal LV systolic function. He has no chest pains worrisome for angina. Stress myoview January 2015 with no ischemia. Continue ASA and statin.      2. HYPERLIPIDEMIA: Continue Crestor. I will arrange fasting lipids and LFTs  3. HTN: BP is controlled. No changes.   4. DM: Followed in primary care  5. Sleep apnea: he is on CPAP  Current medicines are reviewed at length with the patient today.  The patient does not have concerns regarding medicines.  The following changes have been made:  no change  Labs/ tests ordered today include:   Orders Placed This Encounter  Procedures  . Lipid Profile  . Hepatic function panel    Disposition:   FU with me in 6 months  Signed, Lauree Chandler, MD 03/23/2018 11:00 AM    [facility] [place], [place], [place]  [address] Phone: [phone]; Fax: [phone]

## 2018-03-23 ENCOUNTER — Encounter: Payer: Self-pay | Admitting: Cardiovascular Disease

## 2018-03-23 ENCOUNTER — Ambulatory Visit (INDEPENDENT_AMBULATORY_CARE_PROVIDER_SITE_OTHER): Payer: Medicare Other | Admitting: Cardiovascular Disease

## 2018-03-23 VITALS — BP 138/58 | HR 95 | Ht 72.0 in | Wt 229.0 lb

## 2018-03-23 DIAGNOSIS — I2510 Atherosclerotic heart disease of native coronary artery without angina pectoris: Secondary | ICD-10-CM | POA: Diagnosis not present

## 2018-03-23 DIAGNOSIS — E785 Hyperlipidemia, unspecified: Secondary | ICD-10-CM | POA: Diagnosis not present

## 2018-03-23 DIAGNOSIS — I10 Essential (primary) hypertension: Secondary | ICD-10-CM

## 2018-03-23 NOTE — Patient Instructions (Addendum)
Medication Instructions:  Your physician recommends that you continue on your current medications as directed. Please refer to the Current Medication list given to you today.   Labwork: Your physician recommends that you return for lab work on July 8,2019.  This will be fasting. The lab opens at 7:30 AM   Testing/Procedures: none  Follow-Up: Your physician recommends that you schedule a follow-up appointment in: 12 months. Please call our office in about 8 months to schedule this appointment    Any Other Special Instructions Will Be Listed Below (If Applicable).     If you need a refill on your cardiac medications before your next appointment, please call your pharmacy.

## 2018-03-26 ENCOUNTER — Other Ambulatory Visit: Payer: Medicare Other | Admitting: *Deleted

## 2018-03-26 DIAGNOSIS — E785 Hyperlipidemia, unspecified: Secondary | ICD-10-CM | POA: Diagnosis not present

## 2018-03-26 LAB — HEPATIC FUNCTION PANEL
ALBUMIN: 4.2 g/dL (ref 3.5–4.8)
ALK PHOS: 92 IU/L (ref 39–117)
ALT: 25 IU/L (ref 0–44)
AST: 29 IU/L (ref 0–40)
BILIRUBIN TOTAL: 0.3 mg/dL (ref 0.0–1.2)
Bilirubin, Direct: 0.12 mg/dL (ref 0.00–0.40)
Total Protein: 6.8 g/dL (ref 6.0–8.5)

## 2018-03-26 LAB — LIPID PANEL
CHOLESTEROL TOTAL: 147 mg/dL (ref 100–199)
Chol/HDL Ratio: 7.7 ratio — ABNORMAL HIGH (ref 0.0–5.0)
HDL: 19 mg/dL — ABNORMAL LOW (ref >39)
TRIGLYCERIDES: 443 mg/dL — AB (ref 0–149)

## 2018-04-04 DIAGNOSIS — H2513 Age-related nuclear cataract, bilateral: Secondary | ICD-10-CM | POA: Diagnosis not present

## 2018-04-04 DIAGNOSIS — E119 Type 2 diabetes mellitus without complications: Secondary | ICD-10-CM | POA: Diagnosis not present

## 2018-04-04 LAB — HM DIABETES EYE EXAM

## 2018-04-10 ENCOUNTER — Ambulatory Visit (INDEPENDENT_AMBULATORY_CARE_PROVIDER_SITE_OTHER): Payer: Non-veteran care | Admitting: Orthotics

## 2018-04-10 DIAGNOSIS — M779 Enthesopathy, unspecified: Secondary | ICD-10-CM

## 2018-04-10 DIAGNOSIS — M778 Other enthesopathies, not elsewhere classified: Secondary | ICD-10-CM

## 2018-04-10 DIAGNOSIS — M7661 Achilles tendinitis, right leg: Secondary | ICD-10-CM

## 2018-04-10 DIAGNOSIS — M7662 Achilles tendinitis, left leg: Secondary | ICD-10-CM

## 2018-04-10 NOTE — Progress Notes (Signed)
Patient seen today for CMFO per Dr. Amalia Hailey w/ PO from [facility] [place].  Patietn has high cavus foot type as well as prominent bone spurs posterior b/l.  Plan on Richie device with 1/4" heel lift b/l as well as pockets to offload spurs. Deep heel c up and min arch fill.

## 2018-04-10 NOTE — Addendum Note (Signed)
Addended by: Velora Heckler on: 04/10/2018 10:13 AM   Modules accepted: Level of Service

## 2018-04-18 ENCOUNTER — Ambulatory Visit (INDEPENDENT_AMBULATORY_CARE_PROVIDER_SITE_OTHER): Payer: Medicare Other | Admitting: Pharmacist

## 2018-04-18 DIAGNOSIS — E782 Mixed hyperlipidemia: Secondary | ICD-10-CM

## 2018-04-18 DIAGNOSIS — I2510 Atherosclerotic heart disease of native coronary artery without angina pectoris: Secondary | ICD-10-CM | POA: Diagnosis not present

## 2018-04-18 LAB — CK: CK TOTAL: 221 U/L — AB (ref 24–204)

## 2018-04-18 NOTE — Progress Notes (Signed)
Patient ID: Brent [name].                 DOB: [birth year]                    MRN: [id number]     HPI: Brent [name]. is a 72 y.o. male patient referred to lipid clinic by Dr Angelena Form for elevated TG. PMH is significant for CAD s/p angioplasty of RCA in 1990 and BMS in RCA in 2000, HLD, DM, and OSA. Pt previously participated in the ACCELERATE trial (CTEP inhibitor) and was noted to have elevated CK.  He stopped the study drug mid 2015 due to weakness.  His first elevated CK was sometime around the end of September and he stopped Lipitor at that time. Unfortunately those labs are not available.  His CK in October off Lipitor was still elevated at 425.  A repeat 6 weeks later showed that it had normalized at 198.  He was previously tried on Livalo which also caused elevated CK to 417. He was then placed on Crestor in 01/2015 and has been taking 20mg  daily since then. No recent CK checks since 2016.  Pt reports tolerating his rosuvastatin and fenofibrate well. His A1c was 6.5 when checked at the [facility] last month. He watches his carbs and sugar. He does eat bacon or sausage with eggs every morning and drinks 1-2 whiskey and diet Cokes most nights.   Current Medications: rosuvastatin 20mg  daily, fenofibrate 160mg  daily Intolerances: atorvastatin 40mg  daily, Livalo 2mg  daily - increased CK Risk Factors: CAD s/p PCI, DM LDL goal: 70mg /dL  Diet:  Breakfast - 1-2 eggs, 2 slices of toast, bacon or sausage, coffee.  Lunch - [place] or ham sandwich, chips, diet soda. Dinner - more meals at home than restaurants. Likes chicken, pork, vegetable, carb, water. 1-2 whiskey and diet Cokes most days a week.  Exercise: 48 year old grandchild keeps him busy. [facility]. Rides the stationary bike, uses resistance/weight machines.   Family History: Heart disease in father and sister.  Social History: Former smoker 1.5 PPD for 30 years, quit in 1982. 7-14 shots of liquor per  week.   Labs: 03/2018: TC 147, TG 443, HDL 19, LDL not calculable (rosuvastatin 20mg  daily, fenofibrate 160mg  daily) 09/2013: TC 159, TG 263, HDL 29, LDL 101, LFTs normal, CK 417 (fenofibrate 160mg , Livalo 2mg ) 08/2014: CK 198 (fenofibrate 160mg  daily)   Past Medical History:  Diagnosis Date  . Allergy   . CAD (coronary artery disease)    a. angioplasty of his RCA in 1990. b. bare metal stent placed in the RCA in 2000 followed by rotational atherectomy shortly after for stent restenosis. c. last cath was in 2012 showing stable moderate diffuse CAD. (70% mid LAD, 80% diagonal, 70% Ramus, 40% mid to distal RCA stent restenosis). d. Low risk nuc in 2015.  . Diabetes mellitus   . Diverticulosis   . Erectile dysfunction   . Hemorrhoids   . HTN (hypertension)   . Hyperlipidemia   . Myocardial infarction ([place])   . Obesity   . OSA (obstructive sleep apnea)   . Persistent disorder of initiating or maintaining sleep   . Personal history of colonic polyps 02/05/2003    Current Outpatient Medications on File Prior to Visit  Medication Sig Dispense Refill  . aspirin 81 MG tablet Take 81 mg by mouth daily.    Marland Kitchen DEPO-TESTOSTERONE 200 MG/ML injection     .  empagliflozin (JARDIANCE) 25 MG TABS tablet Take 25 mg by mouth daily.    . fenofibrate 160 MG tablet Take 160 mg by mouth daily.    . fluticasone (FLONASE) 50 MCG/ACT nasal spray PLACE 2 SPRAYS INTO BOTH NOSTRILS DAILY. 48 g 3  . insulin aspart (NOVOLOG FLEXPEN) 100 UNIT/ML FlexPen Inject 40 Units 3 (three) times daily with meals into the skin.    Marland Kitchen insulin glargine (LANTUS) 100 unit/mL SOPN Inject 60 Units at bedtime into the skin.    Marland Kitchen lisinopril (PRINIVIL,ZESTRIL) 5 MG tablet Take 5 mg by mouth daily.    Marland Kitchen LORazepam (ATIVAN) 1 MG tablet TAKE 1 TABLET TWICE A DAY AS NEEDED FOR ANXIETY 180 tablet 0  . metFORMIN (GLUCOPHAGE) 1000 MG tablet Take 1,000 mg by mouth 2 (two) times daily with a meal.    . Multiple Vitamins-Minerals (CENTRUM SILVER  PO) 1 tab po qd     . oxybutynin (DITROPAN) 5 MG tablet Take 1 tablet by mouth daily.    . rosuvastatin (CRESTOR) 20 MG tablet Take 1 tablet (20 mg total) by mouth daily. 90 tablet 3  . sertraline (ZOLOFT) 100 MG tablet Take 150 mg by mouth daily. Total of 150mg  daily    . vitamin B-12 (CYANOCOBALAMIN) 1000 MCG tablet Take 1,000 mcg by mouth daily.      Marland Kitchen zolpidem (AMBIEN) 10 MG tablet Take 10 mg by mouth at bedtime as needed. For insomnia     Current Facility-Administered Medications on File Prior to Visit  Medication Dose Route Frequency Provider Last Rate Last Dose  . methylPREDNISolone acetate (DEPO-MEDROL) injection 80 mg  80 mg Other Once Magnus Sinning, MD        Allergies  Allergen Reactions  . Penicillins Hives and Rash    Assessment/Plan:  1. Hyperlipidemia - LDL not calculable due to elevated TG. Will start Vascepa 2g BID for TG lowering and positive cardiovascular outcomes as seen in REDUCE-IT trial. Discussed reducing alcohol to help lower TG as well. Will check CK today on rosuvastatin 20mg  daily and if stable, will increase rosuvastatin to 40mg  daily to target LDL < 70. Encouraged pt to limit intake of eggs, bacon, and sausage. Will recheck labs in 2-3 months. Provided pt with hard copy prescriptions to bring to the [place] (including Lovaza rx if Vascepa is not covered).   Alizaya Oshea E. Miette Molenda, PharmD, BCACP, [employer] [address], [place], [place] [address] Phone: [phone]; Fax: [phone] 04/18/2018 10:47 AM

## 2018-04-18 NOTE — Patient Instructions (Addendum)
It was nice to meet you today  Increase your rosuvastatin (Crestor) to 40mg  daily  Start taking prescription fish oil (Vascepa) 2 grams twice a day. If the [place] does not cover this, we would like to try Lovaza 2 grams twice a day.  Continue taking fenofibrate 160mg  daily  Try to reduce your intake of alcohol, bacon, and sausage  We will recheck your cholesterol on Monday, October 14th. Come in any time after 7:30am for fasting lab work

## 2018-05-01 ENCOUNTER — Other Ambulatory Visit: Payer: Non-veteran care | Admitting: Orthotics

## 2018-05-02 ENCOUNTER — Ambulatory Visit: Payer: Non-veteran care | Admitting: Orthotics

## 2018-05-02 DIAGNOSIS — M7662 Achilles tendinitis, left leg: Secondary | ICD-10-CM

## 2018-05-02 DIAGNOSIS — M7661 Achilles tendinitis, right leg: Secondary | ICD-10-CM

## 2018-05-02 DIAGNOSIS — M778 Other enthesopathies, not elsewhere classified: Secondary | ICD-10-CM

## 2018-05-02 DIAGNOSIS — M779 Enthesopathy, unspecified: Principal | ICD-10-CM

## 2018-05-02 NOTE — Progress Notes (Signed)
Patient came in today to pick up custom made foot orthotics.  The goals were accomplished and the patient reported no dissatisfaction with said orthotics.  Patient was advised of breakin period and how to report any issues. 

## 2018-05-22 ENCOUNTER — Other Ambulatory Visit: Payer: Self-pay | Admitting: Internal Medicine

## 2018-05-23 ENCOUNTER — Other Ambulatory Visit: Payer: Self-pay | Admitting: Internal Medicine

## 2018-05-23 NOTE — Telephone Encounter (Signed)
Needs office visit for refill.  Patient may consider having this refilled at the [facility] where he is primarily followed

## 2018-05-24 ENCOUNTER — Ambulatory Visit (INDEPENDENT_AMBULATORY_CARE_PROVIDER_SITE_OTHER): Payer: Medicare Other | Admitting: Internal Medicine

## 2018-05-24 ENCOUNTER — Encounter: Payer: Self-pay | Admitting: Internal Medicine

## 2018-05-24 VITALS — BP 120/62 | HR 75 | Temp 98.2°F | Wt 226.2 lb

## 2018-05-24 DIAGNOSIS — Z23 Encounter for immunization: Secondary | ICD-10-CM | POA: Diagnosis not present

## 2018-05-24 DIAGNOSIS — E782 Mixed hyperlipidemia: Secondary | ICD-10-CM

## 2018-05-24 DIAGNOSIS — E1151 Type 2 diabetes mellitus with diabetic peripheral angiopathy without gangrene: Secondary | ICD-10-CM

## 2018-05-24 DIAGNOSIS — I10 Essential (primary) hypertension: Secondary | ICD-10-CM | POA: Diagnosis not present

## 2018-05-24 DIAGNOSIS — I2510 Atherosclerotic heart disease of native coronary artery without angina pectoris: Secondary | ICD-10-CM | POA: Diagnosis not present

## 2018-05-24 LAB — POCT GLYCOSYLATED HEMOGLOBIN (HGB A1C): Hemoglobin A1C: 6 % — AB (ref 4.0–5.6)

## 2018-05-24 MED ORDER — LORAZEPAM 1 MG PO TABS: ORAL_TABLET | ORAL | 0 refills | Status: DC

## 2018-05-24 NOTE — Patient Instructions (Addendum)
Limit your sodium (Salt) intake  Please see your eye doctor yearly to check for diabetic eye damage   Please check your hemoglobin A1c every 3-4 months  Please check your blood pressure on a regular basis.  If it is consistently greater than 140/90, please make an office appointment.  Return in 6 months for follow-up  Please attempt to titrate lorazepam use

## 2018-05-24 NOTE — Progress Notes (Signed)
Subjective:    Patient ID: Brent [name]., male    DOB: [birth year], 72 y.o.   MRN: [id number]  HPI  72 year old patient who is seen today for follow-up.  He is also followed at [facility]. He has a history of diabetes.  Remains on basal insulin 60 units.  He remains on mealtime sliding scale coverage.  He checks preprandial blood sugars and does carb counting to determine prandial insulin dosing He has essential hypertension and dyslipidemia. He has history of anxiety disorder.  He is on lorazepam that he takes often 1 mg twice daily.  He and his wife care for a 51 year old grandchild He has a history of coronary artery disease which has been stable.  No exertional chest pain.  Past Medical History:  Diagnosis Date  . Allergy   . CAD (coronary artery disease)    a. angioplasty of his RCA in 1990. b. bare metal stent placed in the RCA in 2000 followed by rotational atherectomy shortly after for stent restenosis. c. last cath was in 2012 showing stable moderate diffuse CAD. (70% mid LAD, 80% diagonal, 70% Ramus, 40% mid to distal RCA stent restenosis). d. Low risk nuc in 2015.  . Diabetes mellitus   . Diverticulosis   . Erectile dysfunction   . Hemorrhoids   . HTN (hypertension)   . Hyperlipidemia   . Myocardial infarction ([place])   . Obesity   . OSA (obstructive sleep apnea)   . Persistent disorder of initiating or maintaining sleep   . Personal history of colonic polyps 02/05/2003     Social History   Socioeconomic History  . Marital status: Married    Spouse name: Not on file  . Number of children: Not on file  . Years of education: Not on file  . Highest education level: Not on file  Occupational History  . Not on file  Social Needs  . Financial resource strain: Not on file  . Food insecurity:    Worry: Not on file    Inability: Not on file  . Transportation needs:    Medical: Not on file    Non-medical: Not on file  Tobacco Use  . Smoking status:  Former Smoker    Packs/day: 1.50    Years: 30.00    Pack years: 45.00    Types: Cigarettes    Last attempt to quit: 09/19/1980    Years since quitting: 37.7  . Smokeless tobacco: Never Used  Substance and Sexual Activity  . Alcohol use: Yes    Alcohol/week: 7.0 - 14.0 standard drinks    Types: 7 - 14 Shots of liquor per week  . Drug use: No  . Sexual activity: Not on file  Lifestyle  . Physical activity:    Days per week: Not on file    Minutes per session: Not on file  . Stress: Not on file  Relationships  . Social connections:    Talks on phone: Not on file    Gets together: Not on file    Attends religious service: Not on file    Active member of club or organization: Not on file    Attends meetings of clubs or organizations: Not on file    Relationship status: Not on file  . Intimate partner violence:    Fear of current or ex partner: Not on file    Emotionally abused: Not on file    Physically abused: Not on file    Forced sexual activity:  Not on file  Other Topics Concern  . Not on file  Social History Narrative  . Not on file    Past Surgical History:  Procedure Laterality Date  . CHOLECYSTECTOMY    . CORONARY ANGIOPLASTY    . CORONARY STENT PLACEMENT     stenting of the right coronary artery with followup rotational  atherectomy.   Marland Kitchen FINGER SURGERY     right  . FOOT SURGERY     right  . INGUINAL HERNIA REPAIR     right  . ORTHOPEDIC SURGERY     foot right  . rotator cuff surg     Bil    Family History  Problem Relation Age of Onset  . Liver cancer Father   . Lung cancer Father   . Heart disease Father   . Heart disease Sister   . Lung cancer Mother   . COPD Mother   . Obesity Mother   . Colon cancer Neg Hx     Allergies  Allergen Reactions  . Penicillins Hives and Rash    Current Outpatient Medications on File Prior to Visit  Medication Sig Dispense Refill  . aspirin 81 MG tablet Take 81 mg by mouth daily.    Marland Kitchen DEPO-TESTOSTERONE 200  MG/ML injection     . empagliflozin (JARDIANCE) 25 MG TABS tablet Take 25 mg by mouth daily.    . fenofibrate 160 MG tablet Take 160 mg by mouth daily.    . fluticasone (FLONASE) 50 MCG/ACT nasal spray PLACE 2 SPRAYS INTO BOTH NOSTRILS DAILY. 48 g 3  . Icosapent Ethyl (VASCEPA) 1 g CAPS Take 2 capsules (2 g total) by mouth 2 (two) times daily. 120 capsule   . insulin aspart (NOVOLOG FLEXPEN) 100 UNIT/ML FlexPen Inject 40 Units 3 (three) times daily with meals into the skin.    Marland Kitchen insulin glargine (LANTUS) 100 unit/mL SOPN Inject 60 Units at bedtime into the skin.    Marland Kitchen lisinopril (PRINIVIL,ZESTRIL) 5 MG tablet Take 5 mg by mouth daily.    . metFORMIN (GLUCOPHAGE) 1000 MG tablet Take 1,000 mg by mouth 2 (two) times daily with a meal.    . Multiple Vitamins-Minerals (CENTRUM SILVER PO) 1 tab po qd     . oxybutynin (DITROPAN) 5 MG tablet Take 1 tablet by mouth daily.    . rosuvastatin (CRESTOR) 40 MG tablet Take 1 tablet (40 mg total) by mouth daily.    . sertraline (ZOLOFT) 100 MG tablet Take 150 mg by mouth daily. Total of 150mg  daily    . vitamin B-12 (CYANOCOBALAMIN) 1000 MCG tablet Take 1,000 mcg by mouth daily.      Marland Kitchen zolpidem (AMBIEN) 10 MG tablet Take 10 mg by mouth at bedtime as needed. For insomnia     Current Facility-Administered Medications on File Prior to Visit  Medication Dose Route Frequency Provider Last Rate Last Dose  . methylPREDNISolone acetate (DEPO-MEDROL) injection 80 mg  80 mg Other Once Magnus Sinning, MD        BP 120/62 (BP Location: Right Arm, Patient Position: Sitting, Cuff Size: Normal)   Pulse 75   Temp 98.2 F (36.8 C) (Oral)   Wt 226 lb 3.2 oz (102.6 kg)   SpO2 96%   BMI 30.68 kg/m     Review of Systems  Constitutional: Negative for appetite change, chills, fatigue and fever.  HENT: Negative for congestion, dental problem, ear pain, hearing loss, sore throat, tinnitus, trouble swallowing and voice change.   Eyes: Negative for  pain, discharge and  visual disturbance.  Respiratory: Negative for cough, chest tightness, wheezing and stridor.   Cardiovascular: Negative for chest pain, palpitations and leg swelling.  Gastrointestinal: Negative for abdominal distention, abdominal pain, blood in stool, constipation, diarrhea, nausea and vomiting.  Genitourinary: Negative for difficulty urinating, discharge, flank pain, genital sores, hematuria and urgency.  Musculoskeletal: Negative for arthralgias, back pain, gait problem, joint swelling, myalgias and neck stiffness.  Skin: Negative for rash.  Neurological: Negative for dizziness, syncope, speech difficulty, weakness, numbness and headaches.  Hematological: Negative for adenopathy. Does not bruise/bleed easily.  Psychiatric/Behavioral: Negative for behavioral problems and dysphoric mood. The patient is nervous/anxious.        Objective:   Physical Exam  Constitutional: He is oriented to person, place, and time. He appears well-developed.  HENT:  Head: Normocephalic.  Right Ear: External ear normal.  Left Ear: External ear normal.  Eyes: Conjunctivae and EOM are normal.  Neck: Normal range of motion.  Cardiovascular: Normal rate and normal heart sounds.  Pulmonary/Chest: Breath sounds normal.  Abdominal: Bowel sounds are normal.  Musculoskeletal: Normal range of motion. He exhibits no edema or tenderness.  Neurological: He is alert and oriented to person, place, and time.  Psychiatric: He has a normal mood and affect. His behavior is normal.          Assessment & Plan:   Diabetes mellitus.  Well-controlled Essential hypertension stable Coronary artery disease stable. Adjustment disorder with anxious mood.  Patient was asked to attempt to taper lorazepam use.  New prescription updated.  [facility] Patient will establish a new provider here in the next 3 to 6 months Medications updated  Marletta Lor

## 2018-06-06 DIAGNOSIS — D485 Neoplasm of uncertain behavior of skin: Secondary | ICD-10-CM | POA: Diagnosis not present

## 2018-06-06 DIAGNOSIS — C434 Malignant melanoma of scalp and neck: Secondary | ICD-10-CM | POA: Diagnosis not present

## 2018-06-06 DIAGNOSIS — L565 Disseminated superficial actinic porokeratosis (DSAP): Secondary | ICD-10-CM | POA: Diagnosis not present

## 2018-06-06 DIAGNOSIS — L821 Other seborrheic keratosis: Secondary | ICD-10-CM | POA: Diagnosis not present

## 2018-06-12 DIAGNOSIS — L821 Other seborrheic keratosis: Secondary | ICD-10-CM | POA: Diagnosis not present

## 2018-06-12 DIAGNOSIS — D485 Neoplasm of uncertain behavior of skin: Secondary | ICD-10-CM | POA: Diagnosis not present

## 2018-06-12 DIAGNOSIS — Z85820 Personal history of malignant melanoma of skin: Secondary | ICD-10-CM | POA: Diagnosis not present

## 2018-06-12 DIAGNOSIS — C434 Malignant melanoma of scalp and neck: Secondary | ICD-10-CM | POA: Diagnosis not present

## 2018-06-12 DIAGNOSIS — D1801 Hemangioma of skin and subcutaneous tissue: Secondary | ICD-10-CM | POA: Diagnosis not present

## 2018-06-12 DIAGNOSIS — C44612 Basal cell carcinoma of skin of right upper limb, including shoulder: Secondary | ICD-10-CM | POA: Diagnosis not present

## 2018-06-12 DIAGNOSIS — C44519 Basal cell carcinoma of skin of other part of trunk: Secondary | ICD-10-CM | POA: Diagnosis not present

## 2018-06-20 ENCOUNTER — Encounter: Payer: Self-pay | Admitting: Internal Medicine

## 2018-06-20 DIAGNOSIS — C434 Malignant melanoma of scalp and neck: Secondary | ICD-10-CM | POA: Diagnosis not present

## 2018-06-21 DIAGNOSIS — C434 Malignant melanoma of scalp and neck: Secondary | ICD-10-CM | POA: Diagnosis not present

## 2018-07-02 ENCOUNTER — Other Ambulatory Visit: Payer: Medicare Other

## 2018-07-02 DIAGNOSIS — N189 Chronic kidney disease, unspecified: Secondary | ICD-10-CM | POA: Diagnosis not present

## 2018-07-02 DIAGNOSIS — G4730 Sleep apnea, unspecified: Secondary | ICD-10-CM | POA: Diagnosis not present

## 2018-07-02 DIAGNOSIS — C434 Malignant melanoma of scalp and neck: Secondary | ICD-10-CM | POA: Diagnosis not present

## 2018-07-02 DIAGNOSIS — Z801 Family history of malignant neoplasm of trachea, bronchus and lung: Secondary | ICD-10-CM | POA: Diagnosis not present

## 2018-07-02 DIAGNOSIS — I2510 Atherosclerotic heart disease of native coronary artery without angina pectoris: Secondary | ICD-10-CM | POA: Diagnosis not present

## 2018-07-02 DIAGNOSIS — Z880 Allergy status to penicillin: Secondary | ICD-10-CM | POA: Diagnosis not present

## 2018-07-02 DIAGNOSIS — Z9989 Dependence on other enabling machines and devices: Secondary | ICD-10-CM | POA: Diagnosis not present

## 2018-07-02 DIAGNOSIS — Z800 Family history of malignant neoplasm of digestive organs: Secondary | ICD-10-CM | POA: Diagnosis not present

## 2018-07-02 DIAGNOSIS — Z955 Presence of coronary angioplasty implant and graft: Secondary | ICD-10-CM | POA: Diagnosis not present

## 2018-07-02 DIAGNOSIS — Z79899 Other long term (current) drug therapy: Secondary | ICD-10-CM | POA: Diagnosis not present

## 2018-07-02 DIAGNOSIS — I252 Old myocardial infarction: Secondary | ICD-10-CM | POA: Diagnosis not present

## 2018-07-02 DIAGNOSIS — F329 Major depressive disorder, single episode, unspecified: Secondary | ICD-10-CM | POA: Diagnosis not present

## 2018-07-02 DIAGNOSIS — Z87891 Personal history of nicotine dependence: Secondary | ICD-10-CM | POA: Diagnosis not present

## 2018-07-02 DIAGNOSIS — Z794 Long term (current) use of insulin: Secondary | ICD-10-CM | POA: Diagnosis not present

## 2018-07-02 DIAGNOSIS — Z9049 Acquired absence of other specified parts of digestive tract: Secondary | ICD-10-CM | POA: Diagnosis not present

## 2018-07-02 DIAGNOSIS — F419 Anxiety disorder, unspecified: Secondary | ICD-10-CM | POA: Diagnosis not present

## 2018-07-02 DIAGNOSIS — L905 Scar conditions and fibrosis of skin: Secondary | ICD-10-CM | POA: Diagnosis not present

## 2018-07-09 ENCOUNTER — Other Ambulatory Visit: Payer: Medicare Other

## 2018-07-10 ENCOUNTER — Ambulatory Visit: Payer: Medicare Other

## 2018-07-11 DIAGNOSIS — C434 Malignant melanoma of scalp and neck: Secondary | ICD-10-CM | POA: Diagnosis not present

## 2018-07-13 DIAGNOSIS — Z85820 Personal history of malignant melanoma of skin: Secondary | ICD-10-CM | POA: Diagnosis not present

## 2018-07-18 ENCOUNTER — Other Ambulatory Visit: Payer: Medicare Other

## 2018-07-31 DIAGNOSIS — L0889 Other specified local infections of the skin and subcutaneous tissue: Secondary | ICD-10-CM | POA: Diagnosis not present

## 2018-07-31 DIAGNOSIS — Z85820 Personal history of malignant melanoma of skin: Secondary | ICD-10-CM | POA: Diagnosis not present

## 2018-07-31 DIAGNOSIS — L98499 Non-pressure chronic ulcer of skin of other sites with unspecified severity: Secondary | ICD-10-CM | POA: Diagnosis not present

## 2018-07-31 DIAGNOSIS — Z85828 Personal history of other malignant neoplasm of skin: Secondary | ICD-10-CM | POA: Diagnosis not present

## 2018-08-08 ENCOUNTER — Ambulatory Visit (INDEPENDENT_AMBULATORY_CARE_PROVIDER_SITE_OTHER)
Admission: RE | Admit: 2018-08-08 | Discharge: 2018-08-08 | Disposition: A | Discharge status: Home / Self Care | Payer: Medicare Other | Source: Ambulatory Visit | Attending: Internal Medicine | Admitting: Internal Medicine

## 2018-08-08 ENCOUNTER — Encounter: Payer: Self-pay | Admitting: Internal Medicine

## 2018-08-08 ENCOUNTER — Ambulatory Visit (INDEPENDENT_AMBULATORY_CARE_PROVIDER_SITE_OTHER): Payer: Medicare Other | Admitting: Internal Medicine

## 2018-08-08 VITALS — BP 132/62 | HR 89 | Ht 72.0 in | Wt 224.2 lb

## 2018-08-08 DIAGNOSIS — C439 Malignant melanoma of skin, unspecified: Secondary | ICD-10-CM

## 2018-08-08 DIAGNOSIS — R05 Cough: Secondary | ICD-10-CM | POA: Diagnosis not present

## 2018-08-08 DIAGNOSIS — I2510 Atherosclerotic heart disease of native coronary artery without angina pectoris: Secondary | ICD-10-CM | POA: Diagnosis not present

## 2018-08-08 DIAGNOSIS — R0600 Dyspnea, unspecified: Secondary | ICD-10-CM

## 2018-08-08 NOTE — Patient Instructions (Signed)
Order- CXR dx melanoma, dyspnea  We can continue CPAP at current settings, mask of choice, humidifier, supplies, AirView  Please call if we can help

## 2018-08-08 NOTE — Progress Notes (Signed)
HPI male former smoker followed for OSA, insomnia, complicated by CAD/MI/stent, DM2 NPSG 2008:  AHI 7/hr.  --------------------------------------------------------------------  08/08/17- 72 year old male former smoker followed for OSA, insomnia, complicated by CAD/MI/stent, DM OSA; DME [facility]; pt needs new CPAP machine; currently wears his nightly-did not bring SD card and not in AV. Pt is sick as well today x 2 weeks; continues to have wheezing, chest tightness, SOB and cough.  Acute illness began 2 weeks ago with cough, productive initially dark green sputum, temperature 99 degrees some blood streaks in sputum, muscle aches.  Has had flu shot.  Mild GI upset from cough syrup.  Sputum color has cleared some.  08/08/2018-  72 year old male former smoker followed for OSA, insomnia, complicated by CAD/MI/stent, DM2, malignant melanoma, CPAP  Auto 10-20/ [facility] -----OSA: DME: [facility] Pt states he wears CPAP nightly and did not bring SD card or machine today. No new supplies needed at this time.  Download compliance 80% compliance AHI 0.5/hour He says he could not sleep without CPAP now. Breathing is fine except for a light chronic cough.  Uses Flonase every night.  ROS-see HPI   + = positive Constitutional:    weight loss, night sweats, fevers, chills, + fatigue, lassitude. HEENT:    headaches, difficulty swallowing, tooth/dental problems, sore throat,       sneezing, itching, ear ache, +nasal congestion, post nasal drip, snoring CV:    chest pain, orthopnea, PND, swelling in lower extremities, anasarca,                                                      dizziness, palpitations Resp:   + Shortness of breath with exertion or at rest.               Aside productive cough,   +non-productive cough, + coughing up of blood.              + Change in color of mucus.  wheezing.   Skin:    rash or lesions. GI:  No-   heartburn, indigestion, abdominal pain, nausea, vomiting,  GU:  MS:   joint pain,  stiffness,  Neuro-     nothing unusual Psych:  change in mood or affect.  depression or anxiety.   memory loss.  OBJ- Physical Exam General- Alert, Oriented, Affect-appropriate, Distress- none acute Skin- rash-none, lesions- none, excoriation- none Lymphadenopathy- none Head- -- + and its left side of neck from recently resected melanoma.            Eyes- Gross vision intact, PERRLA, conjunctivae and secretions clear            Ears- Hearing, canals-normal            Nose- Clear, no-Septal dev, mucus, polyps, erosion, perforation             Throat- Mallampati II-III , mucosa clear , drainage- none, tonsils- atrophic Neck- flexible , trachea midline, no stridor , thyroid nl, carotid no bruit Chest - symmetrical excursion , unlabored           Heart/CV- RRR , no murmur , no gallop  , no rub, nl s1 s2                           - JVD- none ,  edema- none, stasis changes- none, varices- none           Lung-+ wearing facemask, mildly labored with saturation 94%, wheeze- none,                                          cough -none, dullness-none, rub- none           Chest wall-  Abd-  Br/ Gen/ Rectal- Not done, not indicated Extrem- cyanosis- none, clubbing, none, atrophy- none, strength- nl Neuro- grossly intact to observation

## 2018-08-11 ENCOUNTER — Encounter: Payer: Self-pay | Admitting: Internal Medicine

## 2018-08-28 ENCOUNTER — Encounter

## 2018-08-29 ENCOUNTER — Other Ambulatory Visit: Payer: Self-pay | Admitting: Internal Medicine

## 2018-08-29 MED ORDER — FLUTICASONE PROPIONATE 50 MCG/ACT NA SUSP: 2.0000 | Freq: Every day | NASAL | 3 refills | Status: DC

## 2018-09-10 DIAGNOSIS — N138 Other obstructive and reflux uropathy: Secondary | ICD-10-CM | POA: Diagnosis not present

## 2018-09-10 DIAGNOSIS — E291 Testicular hypofunction: Secondary | ICD-10-CM | POA: Diagnosis not present

## 2018-09-10 DIAGNOSIS — N200 Calculus of kidney: Secondary | ICD-10-CM | POA: Diagnosis not present

## 2018-09-10 DIAGNOSIS — N481 Balanitis: Secondary | ICD-10-CM | POA: Diagnosis not present

## 2018-09-10 DIAGNOSIS — N401 Enlarged prostate with lower urinary tract symptoms: Secondary | ICD-10-CM | POA: Diagnosis not present

## 2018-09-10 DIAGNOSIS — N281 Cyst of kidney, acquired: Secondary | ICD-10-CM | POA: Diagnosis not present

## 2018-10-03 DIAGNOSIS — C434 Malignant melanoma of scalp and neck: Secondary | ICD-10-CM | POA: Diagnosis not present

## 2018-10-22 ENCOUNTER — Other Ambulatory Visit: Payer: Self-pay | Admitting: Internal Medicine

## 2018-10-25 ENCOUNTER — Encounter (INDEPENDENT_AMBULATORY_CARE_PROVIDER_SITE_OTHER): Payer: Self-pay | Admitting: Physical Medicine and Rehabilitation

## 2018-10-25 ENCOUNTER — Ambulatory Visit (INDEPENDENT_AMBULATORY_CARE_PROVIDER_SITE_OTHER): Payer: Medicare Other | Admitting: Physical Medicine and Rehabilitation

## 2018-10-25 VITALS — BP 152/66 | HR 80

## 2018-10-25 DIAGNOSIS — G518 Other disorders of facial nerve: Secondary | ICD-10-CM | POA: Insufficient documentation

## 2018-10-25 DIAGNOSIS — M542 Cervicalgia: Secondary | ICD-10-CM | POA: Diagnosis not present

## 2018-10-25 DIAGNOSIS — C434 Malignant melanoma of scalp and neck: Secondary | ICD-10-CM | POA: Diagnosis not present

## 2018-10-25 DIAGNOSIS — M7918 Myalgia, other site: Secondary | ICD-10-CM | POA: Diagnosis not present

## 2018-10-25 DIAGNOSIS — M5481 Occipital neuralgia: Secondary | ICD-10-CM

## 2018-10-25 HISTORY — DX: Malignant melanoma of scalp and neck: C43.4

## 2018-10-25 MED ORDER — TRIAMCINOLONE ACETONIDE 40 MG/ML IJ SUSP
20.0000 mg | INTRAMUSCULAR | Status: AC | PRN
  Administered 2018-10-25: 20 mg via INTRAMUSCULAR

## 2018-10-25 MED ORDER — LIDOCAINE HCL 1 % IJ SOLN
3.0000 mL | INTRAMUSCULAR | Status: AC | PRN
  Administered 2018-10-25: 3 mL

## 2018-10-25 NOTE — Progress Notes (Signed)
  Numeric Pain Rating Scale and Functional Assessment Average Pain 6 Pain Right Now 5 My pain is constant and stinging Pain is worse with: touching behind ear, worse later in the day Pain improves with: nothing   In the last MONTH (on 0-10 scale) has pain interfered with the following?  1. General activity like being  able to carry out your everyday physical activities such as walking, climbing stairs, carrying groceries, or moving a chair?  Rating(6)  2. Relation with others like being able to carry out your usual social activities and roles such as  activities at home, at work and in your community. Rating(6)  3. Enjoyment of life such that you have  been bothered by emotional problems such as feeling anxious, depressed or irritable?  Rating(6)
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
## 2018-11-02 ENCOUNTER — Other Ambulatory Visit (INDEPENDENT_AMBULATORY_CARE_PROVIDER_SITE_OTHER): Payer: Self-pay | Admitting: Physical Medicine and Rehabilitation

## 2018-11-02 MED ORDER — DICLOFENAC SODIUM 2 % TD SOLN: TRANSDERMAL | 0 refills | Status: DC

## 2018-11-06 ENCOUNTER — Other Ambulatory Visit (INDEPENDENT_AMBULATORY_CARE_PROVIDER_SITE_OTHER): Payer: Self-pay | Admitting: Physical Medicine and Rehabilitation

## 2018-11-06 ENCOUNTER — Encounter: Payer: Self-pay | Admitting: Internal Medicine

## 2018-11-06 DIAGNOSIS — D485 Neoplasm of uncertain behavior of skin: Secondary | ICD-10-CM | POA: Diagnosis not present

## 2018-11-06 DIAGNOSIS — L400 Psoriasis vulgaris: Secondary | ICD-10-CM | POA: Diagnosis not present

## 2018-11-06 DIAGNOSIS — L821 Other seborrheic keratosis: Secondary | ICD-10-CM | POA: Diagnosis not present

## 2018-11-06 DIAGNOSIS — D2371 Other benign neoplasm of skin of right lower limb, including hip: Secondary | ICD-10-CM | POA: Diagnosis not present

## 2018-11-06 DIAGNOSIS — Z85820 Personal history of malignant melanoma of skin: Secondary | ICD-10-CM | POA: Diagnosis not present

## 2018-11-06 DIAGNOSIS — L570 Actinic keratosis: Secondary | ICD-10-CM | POA: Diagnosis not present

## 2018-11-06 DIAGNOSIS — L565 Disseminated superficial actinic porokeratosis (DSAP): Secondary | ICD-10-CM | POA: Diagnosis not present

## 2018-11-06 DIAGNOSIS — C44519 Basal cell carcinoma of skin of other part of trunk: Secondary | ICD-10-CM | POA: Diagnosis not present

## 2018-11-06 DIAGNOSIS — Z85828 Personal history of other malignant neoplasm of skin: Secondary | ICD-10-CM | POA: Diagnosis not present

## 2018-11-06 MED ORDER — DICLOFENAC SODIUM 2 % TD SOLN: TRANSDERMAL | 0 refills | Status: DC

## 2018-11-11 ENCOUNTER — Emergency Department (HOSPITAL_COMMUNITY): Payer: Medicare Other

## 2018-11-11 ENCOUNTER — Other Ambulatory Visit: Payer: Self-pay

## 2018-11-11 ENCOUNTER — Emergency Department (HOSPITAL_COMMUNITY)
Admission: EM | Admit: 2018-11-11 | Discharge: 2018-11-11 | Disposition: A | Discharge status: Home / Self Care | Payer: Medicare Other | Attending: Emergency Medicine | Admitting: Emergency Medicine

## 2018-11-11 ENCOUNTER — Encounter (HOSPITAL_COMMUNITY): Payer: Self-pay

## 2018-11-11 DIAGNOSIS — Z794 Long term (current) use of insulin: Secondary | ICD-10-CM | POA: Diagnosis not present

## 2018-11-11 DIAGNOSIS — I10 Essential (primary) hypertension: Secondary | ICD-10-CM | POA: Insufficient documentation

## 2018-11-11 DIAGNOSIS — I2510 Atherosclerotic heart disease of native coronary artery without angina pectoris: Secondary | ICD-10-CM | POA: Insufficient documentation

## 2018-11-11 DIAGNOSIS — E119 Type 2 diabetes mellitus without complications: Secondary | ICD-10-CM | POA: Insufficient documentation

## 2018-11-11 DIAGNOSIS — Z880 Allergy status to penicillin: Secondary | ICD-10-CM | POA: Diagnosis not present

## 2018-11-11 DIAGNOSIS — Z79899 Other long term (current) drug therapy: Secondary | ICD-10-CM | POA: Diagnosis not present

## 2018-11-11 DIAGNOSIS — Z85828 Personal history of other malignant neoplasm of skin: Secondary | ICD-10-CM | POA: Diagnosis not present

## 2018-11-11 DIAGNOSIS — J020 Streptococcal pharyngitis: Secondary | ICD-10-CM | POA: Diagnosis not present

## 2018-11-11 DIAGNOSIS — Z7982 Long term (current) use of aspirin: Secondary | ICD-10-CM | POA: Diagnosis not present

## 2018-11-11 DIAGNOSIS — R05 Cough: Secondary | ICD-10-CM | POA: Diagnosis not present

## 2018-11-11 DIAGNOSIS — J029 Acute pharyngitis, unspecified: Secondary | ICD-10-CM | POA: Diagnosis present

## 2018-11-11 DIAGNOSIS — Z87891 Personal history of nicotine dependence: Secondary | ICD-10-CM | POA: Diagnosis not present

## 2018-11-11 LAB — GROUP A STREP BY PCR: Group A Strep by PCR: DETECTED — AB

## 2018-11-11 MED ORDER — CLINDAMYCIN HCL 150 MG PO CAPS
300.0000 mg | ORAL_CAPSULE | Freq: Once | ORAL | Status: AC
  Administered 2018-11-11: 300 mg via ORAL
  Filled 2018-11-11: qty 2

## 2018-11-11 MED ORDER — MAGIC MOUTHWASH
10.0000 mL | Freq: Once | ORAL | Status: DC
  Filled 2018-11-11: qty 10

## 2018-11-11 MED ORDER — ONDANSETRON HCL 4 MG PO TABS: 4.0000 mg | ORAL_TABLET | Freq: Three times a day (TID) | ORAL | 0 refills | Status: DC | PRN

## 2018-11-11 MED ORDER — ACETAMINOPHEN 325 MG PO TABS
650.0000 mg | ORAL_TABLET | Freq: Once | ORAL | Status: AC
  Administered 2018-11-11: 650 mg via ORAL
  Filled 2018-11-11: qty 2

## 2018-11-11 MED ORDER — BENZONATATE 100 MG PO CAPS
100.0000 mg | ORAL_CAPSULE | Freq: Once | ORAL | Status: AC
  Administered 2018-11-11: 100 mg via ORAL
  Filled 2018-11-11: qty 1

## 2018-11-11 MED ORDER — CLINDAMYCIN HCL 150 MG PO CAPS: 300.0000 mg | ORAL_CAPSULE | Freq: Three times a day (TID) | ORAL | 0 refills | Status: DC

## 2018-11-11 MED ORDER — LIDOCAINE VISCOUS HCL 2 % MT SOLN: 15.0000 mL | OROMUCOSAL | 0 refills | Status: DC | PRN

## 2018-11-11 NOTE — ED Notes (Signed)
Patient ambulatory to bathroom with steady gait at this time 

## 2018-11-11 NOTE — ED Notes (Signed)
Patient verbalizes understanding of discharge instructions. Opportunity for questioning and answers were provided. Armband removed by staff, pt discharged from ED ambulatory.   

## 2018-11-11 NOTE — ED Notes (Signed)
Pt and wife state concern about nausea. Will inform provider.

## 2018-11-11 NOTE — Discharge Instructions (Signed)
You have been diagnosed with strep throat.  Take clindamycin as prescribed as treatment.  Gargle and spit or swallow viscous lidocaine as needed for sore throat.  Take tylenol at home for pain.  Return if you have any concerns.

## 2018-11-11 NOTE — ED Triage Notes (Signed)
Pt arrives to ED with complaints of a sore throat, nausea, and vomiting x2 days. Pt states he cannot keep anything down and has not since yesterday. Pt endorses dry cough and some bilateral chest pain with deep breathing.

## 2018-11-11 NOTE — ED Provider Notes (Signed)
[facility] EMERGENCY DEPARTMENT Provider Note   CSN: [id number] Arrival date & time: 11/11/18  1038    History   Chief Complaint No chief complaint on file.   HPI Brent [name]. is a 73 y.o. male.     The history is provided by the patient. No language interpreter was used.  Emesis  Sore Throat      74 year old male with history of coronary disease, diabetes, hypertension presenting today with cold symptoms.  Patient report for the past 2 to 3 days he has had chills, headache, sinus congestion, throat irritation, left ear discomfort, cough productive with white to yellow sputum, nausea, vomiting, and some mild loose stools.  Symptoms moderate in severity.  He has been taking over-the-counter medication such as Tylenol, ibuprofen and cough medication with some improvement.  He denies any exertional chest pain or shortness of breath, no dysuria, no hemoptysis or rash.  He has had his flu shot.  He denies any fever or body aches.  He has been eating much and states that his throat feels very parched.  He denies tobacco use and history alcohol on occasion.  Patient lives at home with his wife and grandchildren.  Past Medical History:  Diagnosis Date  . Allergy   . CAD (coronary artery disease)    a. angioplasty of his RCA in 1990. b. bare metal stent placed in the RCA in 2000 followed by rotational atherectomy shortly after for stent restenosis. c. last cath was in 2012 showing stable moderate diffuse CAD. (70% mid LAD, 80% diagonal, 70% Ramus, 40% mid to distal RCA stent restenosis). d. Low risk nuc in 2015.  . Diabetes mellitus   . Diverticulosis   . Erectile dysfunction   . Hemorrhoids   . HTN (hypertension)   . Hyperlipidemia   . Malignant melanoma of left side of neck ([place]) 10/25/2018  . Myocardial infarction ([place])   . Obesity   . OSA (obstructive sleep apnea)   . Persistent disorder of initiating or maintaining sleep   . Personal history of colonic  polyps 02/05/2003    Patient Active Problem List   Diagnosis Date Noted  . Malignant melanoma of left side of neck ([place]) 10/25/2018  . Facial neuritis 10/25/2018  . Myofascial pain syndrome 10/25/2018  . Occipital neuralgia of left side 10/25/2018  . CAP (community acquired pneumonia) 09/08/2017  . Viral URI with cough 08/02/2017  . Male hypogonadism 06/20/2016  . Renal lesion 06/20/2016  . Insomnia 08/07/2015  . Enlarged prostate with lower urinary tract symptoms (LUTS) 11/07/2014  . Balanitis 07/30/2012  . Bladder neck obstruction 07/30/2012  . Prostate nodule 06/18/2012  . Recurrent nephrolithiasis 06/18/2012  . Urinary urgency 06/18/2012  . Benign neoplasm of colon 08/29/2011  . DEPRESSION, SITUATIONAL, ACUTE 01/23/2010  . Essential hypertension 05/30/2009  . Coronary atherosclerosis 05/30/2009  . Obstructive sleep apnea 11/28/2008  . OBESITY 09/23/2008  . ERECTILE DYSFUNCTION 11/26/2007  . Well controlled type 2 diabetes mellitus with peripheral circulatory disorder ([place]) 05/09/2007  . Hyperlipidemia 05/09/2007  . MYOCARDIAL INFARCTION, HX OF 05/09/2007  . DIVERTICULOSIS, COLON 05/09/2007    Past Surgical History:  Procedure Laterality Date  . CHOLECYSTECTOMY    . CORONARY ANGIOPLASTY    . CORONARY STENT PLACEMENT     stenting of the right coronary artery with followup rotational  atherectomy.   Marland Kitchen FINGER SURGERY     right  . FOOT SURGERY     right  . INGUINAL HERNIA REPAIR  right  . ORTHOPEDIC SURGERY     foot right  . rotator cuff surg     Bil        Home Medications    Prior to Admission medications   Medication Sig Start Date End Date Taking? Authorizing Provider  aspirin 81 MG tablet Take 81 mg by mouth daily.    [provider]  Diclofenac Sodium (PENNSAID) 2 % SOLN Apply 2 pumps to affected area 2 times daily. 11/06/18   Magnus Sinning, MD  empagliflozin (JARDIANCE) 25 MG TABS tablet Take 25 mg by mouth daily.    [provider]  fenofibrate 160 MG tablet Take 160 mg by mouth daily.    [provider]  fluticasone (FLONASE) 50 MCG/ACT nasal spray Place 2 sprays into both nostrils daily. 08/29/18   Deneise Lever, MD  Icosapent Ethyl (VASCEPA) 1 g CAPS Take 2 capsules (2 g total) by mouth 2 (two) times daily. 04/18/18   Burnell Blanks, MD  insulin aspart (NOVOLOG FLEXPEN) 100 UNIT/ML FlexPen Inject 40 Units 3 (three) times daily with meals into the skin.    [provider]  insulin glargine (LANTUS) 100 unit/mL SOPN Inject 60 Units at bedtime into the skin.    [provider]  lisinopril (PRINIVIL,ZESTRIL) 5 MG tablet Take 5 mg by mouth daily.    [provider]  LORazepam (ATIVAN) 1 MG tablet TAKE 1 TABLET TWICE A DAY AS NEEDED FOR ANXIETY 05/24/18   Marletta Lor, MD  metFORMIN (GLUCOPHAGE) 1000 MG tablet Take 1,000 mg by mouth 2 (two) times daily with a meal.    [provider]  Multiple Vitamins-Minerals (CENTRUM SILVER PO) 1 tab po qd     [provider]  oxybutynin (DITROPAN) 5 MG tablet Take 1 tablet by mouth daily. 09/14/17   [provider]  rosuvastatin (CRESTOR) 40 MG tablet Take 1 tablet (40 mg total) by mouth daily. 04/18/18   Burnell Blanks, MD  sertraline (ZOLOFT) 100 MG tablet Take 150 mg by mouth daily. Total of 150mg  daily    [provider]  vitamin B-12 (CYANOCOBALAMIN) 1000 MCG tablet Take 1,000 mcg by mouth daily.      [provider]  zolpidem (AMBIEN) 10 MG tablet Take 10 mg by mouth at bedtime as needed. For insomnia 04/24/12   Swords, Darrick Penna, MD    Family History Family History  Problem Relation Age of Onset  . Liver cancer Father   . Lung cancer Father   . Heart disease Father   . Heart disease Sister   . Lung cancer Mother   . COPD Mother   . Obesity Mother   . Colon cancer Neg Hx     Social History Social History   Tobacco Use  . Smoking status: Former Smoker     Packs/day: 1.50    Years: 30.00    Pack years: 45.00    Types: Cigarettes    Last attempt to quit: 09/19/1980    Years since quitting: 38.1  . Smokeless tobacco: Never Used  Substance Use Topics  . Alcohol use: Yes    Alcohol/week: 7.0 - 14.0 standard drinks    Types: 7 - 14 Shots of liquor per week  . Drug use: No     Allergies   Penicillins   Review of Systems Review of Systems  Gastrointestinal: Positive for vomiting.  All other systems reviewed and are negative.    Physical Exam Updated Vital Signs BP Marland Kitchen)  157/67   Pulse 88   Temp 98.3 F (36.8 C) (Oral)   Resp 17   Ht 6' (1.829 m)   Wt 101.2 kg   SpO2 100%   BMI 30.24 kg/m   Physical Exam Vitals signs and nursing note reviewed.  Constitutional:      General: He is not in acute distress.    Appearance: He is well-developed.  HENT:     Head: Atraumatic.     Comments: Ears: TMs normal bilaterally Nose: Rhinorrhea Throat: Posterior oropharyngeal erythema with mild whitish plaque present.  No tonsillar enlargement no trismus. Eyes:     Conjunctiva/sclera: Conjunctivae normal.  Neck:     Musculoskeletal: Neck supple.  Skin:    Findings: No rash.  Neurological:     Mental Status: He is alert.      ED Treatments / Results  Labs (all labs ordered are listed, but only abnormal results are displayed) Labs Reviewed  GROUP A STREP BY PCR - Abnormal; Notable for the following components:      Result Value   Group A Strep by PCR DETECTED (*)    All other components within normal limits    EKG None  Radiology Dg Chest 2 View  Result Date: 11/11/2018 CLINICAL DATA:  Cough and congestion with headache x 3 days. Hx 2 heart attacks EXAM: CHEST - 2 VIEW COMPARISON:  08/08/2018 FINDINGS: Midline trachea. Borderline cardiomegaly. Atherosclerosis in the transverse aorta. No pleural effusion or pneumothorax. Clear lungs. IMPRESSION: 1. No acute cardiopulmonary disease. 2.  Aortic Atherosclerosis (ICD10-I70.0).  Electronically Signed   By: Abigail Miyamoto M.D.   On: 11/11/2018 12:25    Procedures Procedures (including critical care time)  Medications Ordered in ED Medications  magic mouthwash (has no administration in time range)  benzonatate (TESSALON) capsule 100 mg (100 mg Oral Given 11/11/18 1108)  clindamycin (CLEOCIN) capsule 300 mg (300 mg Oral Given 11/11/18 1217)  acetaminophen (TYLENOL) tablet 650 mg (650 mg Oral Given 11/11/18 1227)     Initial Impression / Assessment and Plan / ED Course  I have reviewed the triage vital signs and the nursing notes.  Pertinent labs & imaging results that were available during my care of the patient were reviewed by me and considered in my medical decision making (see chart for details).        BP (!) 157/67   Pulse 88   Temp 98.3 F (36.8 C) (Oral)   Resp 17   Ht 6' (1.829 m)   Wt 101.2 kg   SpO2 100%   BMI 30.24 kg/m    Final Clinical Impressions(s) / ED Diagnoses   Final diagnoses:  Strep pharyngitis    ED Discharge Orders         Ordered    clindamycin (CLEOCIN) 150 MG capsule  3 times daily     11/11/18 1244    lidocaine (XYLOCAINE) 2 % solution  As needed     11/11/18 1244         11:08 AM Patient here with cold symptoms.  No fever or body aches to suggest influenza.  Lungs clear on auscultation.  Throat exam shows posterior oropharyngeal erythema therefore strep test obtained.  Chest x-ray ordered.  12:46 PM Strep test positive.  Patient is allergic to penicillin therefore will treat with clindamycin.  Tylenol for headache.  Viscous lidocaine provided for sore throat.  His chest x-ray without concerning features.  Care discussed with Dr. Venora Maples.  Patient stable for discharge, return precautions  discussed.   Domenic Moras, PA-C 11/11/18 1246    Jola Schmidt, MD 11/11/18 1537

## 2018-11-15 ENCOUNTER — Ambulatory Visit
Admission: RE | Admit: 2018-11-15 | Discharge: 2018-11-15 | Disposition: A | Discharge status: Home / Self Care | Payer: Medicare Other | Source: Ambulatory Visit | Attending: Physical Medicine and Rehabilitation | Admitting: Physical Medicine and Rehabilitation

## 2018-11-15 ENCOUNTER — Other Ambulatory Visit (INDEPENDENT_AMBULATORY_CARE_PROVIDER_SITE_OTHER): Payer: Self-pay | Admitting: Physical Medicine and Rehabilitation

## 2018-11-15 DIAGNOSIS — M542 Cervicalgia: Secondary | ICD-10-CM

## 2018-11-15 DIAGNOSIS — R599 Enlarged lymph nodes, unspecified: Secondary | ICD-10-CM | POA: Diagnosis not present

## 2018-11-15 MED ORDER — GADOBENATE DIMEGLUMINE 529 MG/ML IV SOLN
20.0000 mL | Freq: Once | INTRAVENOUS | Status: AC | PRN
  Administered 2018-11-15: 20 mL via INTRAVENOUS

## 2018-11-22 ENCOUNTER — Encounter: Payer: Self-pay | Admitting: Internal Medicine

## 2018-11-22 ENCOUNTER — Ambulatory Visit (INDEPENDENT_AMBULATORY_CARE_PROVIDER_SITE_OTHER): Payer: Medicare Other | Admitting: Internal Medicine

## 2018-11-22 VITALS — BP 130/70 | HR 74 | Temp 98.1°F | Ht 72.0 in | Wt 219.6 lb

## 2018-11-22 DIAGNOSIS — E782 Mixed hyperlipidemia: Secondary | ICD-10-CM | POA: Diagnosis not present

## 2018-11-22 DIAGNOSIS — I10 Essential (primary) hypertension: Secondary | ICD-10-CM

## 2018-11-22 DIAGNOSIS — E1151 Type 2 diabetes mellitus with diabetic peripheral angiopathy without gangrene: Secondary | ICD-10-CM

## 2018-11-22 DIAGNOSIS — G4700 Insomnia, unspecified: Secondary | ICD-10-CM | POA: Diagnosis not present

## 2018-11-22 DIAGNOSIS — F341 Dysthymic disorder: Secondary | ICD-10-CM | POA: Diagnosis not present

## 2018-11-22 LAB — POCT GLYCOSYLATED HEMOGLOBIN (HGB A1C): Hemoglobin A1C: 6.3 % — AB (ref 4.0–5.6)

## 2018-11-22 MED ORDER — LORAZEPAM 1 MG PO TABS: ORAL_TABLET | ORAL | 1 refills | Status: DC

## 2018-11-22 NOTE — Patient Instructions (Signed)
-  Nice meeting you today!  -Schedule follow up in 6 months.  -Schedule follow up with Raynelle Dick for your annual medicare wellness visit.

## 2018-11-22 NOTE — Progress Notes (Signed)
Established Patient Office Visit     CC/Reason for Visit: Establish care, follow-up chronic medical conditions  HPI: Chandra Feger. is a 73 y.o. male who is coming in today for the above mentioned reasons. Past Medical History is significant for: Well-controlled insulin-dependent diabetes, hypertension, hyperlipidemia.  He also follows at the [facility] health system.  History of coronary artery disease that has been stable, no exertional chest pain or shortness of breath.  He has an anxiety disorder and is on Lorazepam for which he takes 1 mg always at nighttime and sometimes an additional tablet during the day.  He and his wife have custody of their 26 year old grandson.  He has no acute complaints today.   Past Medical/Surgical History: Past Medical History:  Diagnosis Date  . Allergy   . CAD (coronary artery disease)    a. angioplasty of his RCA in 1990. b. bare metal stent placed in the RCA in 2000 followed by rotational atherectomy shortly after for stent restenosis. c. last cath was in 2012 showing stable moderate diffuse CAD. (70% mid LAD, 80% diagonal, 70% Ramus, 40% mid to distal RCA stent restenosis). d. Low risk nuc in 2015.  . Diabetes mellitus   . Diverticulosis   . Erectile dysfunction   . Hemorrhoids   . HTN (hypertension)   . Hyperlipidemia   . Malignant melanoma of left side of neck ([place]) 10/25/2018  . Myocardial infarction ([place])   . Obesity   . OSA (obstructive sleep apnea)   . Persistent disorder of initiating or maintaining sleep   . Personal history of colonic polyps 02/05/2003    Past Surgical History:  Procedure Laterality Date  . CHOLECYSTECTOMY    . CORONARY ANGIOPLASTY    . CORONARY STENT PLACEMENT     stenting of the right coronary artery with followup rotational  atherectomy.   Marland Kitchen FINGER SURGERY     right  . FOOT SURGERY     right  . INGUINAL HERNIA REPAIR     right  . ORTHOPEDIC SURGERY     foot right  . rotator cuff surg     Bil     Social History:  reports that he quit smoking about 38 years ago. His smoking use included cigarettes. He has a 45.00 pack-year smoking history. He has never used smokeless tobacco. He reports current alcohol use of about 7.0 - 14.0 standard drinks of alcohol per week. He reports that he does not use drugs.  Allergies: Allergies  Allergen Reactions  . Penicillins Hives and Rash    Family History:  Family History  Problem Relation Age of Onset  . Liver cancer Father   . Lung cancer Father   . Heart disease Father   . Heart disease Sister   . Lung cancer Mother   . COPD Mother   . Obesity Mother   . Colon cancer Neg Hx      Current Outpatient Medications:  .  aspirin 81 MG tablet, Take 81 mg by mouth daily., Disp: , Rfl:  .  Diclofenac Sodium (PENNSAID) 2 % SOLN, Apply 2 pumps to affected area 2 times daily., Disp: 1 Bottle, Rfl: 0 .  empagliflozin (JARDIANCE) 25 MG TABS tablet, Take 25 mg by mouth daily., Disp: , Rfl:  .  fenofibrate 160 MG tablet, Take 160 mg by mouth daily., Disp: , Rfl:  .  fluticasone (FLONASE) 50 MCG/ACT nasal spray, Place 2 sprays into both nostrils daily., Disp: 48 g, Rfl: 3 .  Icosapent Ethyl (VASCEPA) 1 g CAPS, Take 2 capsules (2 g total) by mouth 2 (two) times daily., Disp: 120 capsule, Rfl:  .  insulin aspart (NOVOLOG FLEXPEN) 100 UNIT/ML FlexPen, Inject 40 Units 3 (three) times daily with meals into the skin., Disp: , Rfl:  .  insulin glargine (LANTUS) 100 unit/mL SOPN, Inject 60 Units at bedtime into the skin., Disp: , Rfl:  .  lidocaine (XYLOCAINE) 2 % solution, Use as directed 15 mLs in the mouth or throat as needed for mouth pain., Disp: 150 mL, Rfl: 0 .  lisinopril (PRINIVIL,ZESTRIL) 5 MG tablet, Take 5 mg by mouth daily., Disp: , Rfl:  .  LORazepam (ATIVAN) 1 MG tablet, TAKE 1 TABLET TWICE A DAY AS NEEDED FOR ANXIETY, Disp: 180 tablet, Rfl: 1 .  metFORMIN (GLUCOPHAGE) 1000 MG tablet, Take 1,000 mg by mouth 2 (two) times daily with a meal.,  Disp: , Rfl:  .  Multiple Vitamins-Minerals (CENTRUM SILVER PO), 1 tab po qd , Disp: , Rfl:  .  ondansetron (ZOFRAN) 4 MG tablet, Take 1 tablet (4 mg total) by mouth every 8 (eight) hours as needed for nausea or vomiting., Disp: 30 tablet, Rfl: 0 .  oxybutynin (DITROPAN) 5 MG tablet, Take 1 tablet by mouth daily., Disp: , Rfl:  .  rosuvastatin (CRESTOR) 40 MG tablet, Take 1 tablet (40 mg total) by mouth daily., Disp: , Rfl:  .  sertraline (ZOLOFT) 100 MG tablet, Take 150 mg by mouth daily. Total of 150mg  daily, Disp: , Rfl:  .  vitamin B-12 (CYANOCOBALAMIN) 1000 MCG tablet, Take 1,000 mcg by mouth daily.  , Disp: , Rfl:  .  zolpidem (AMBIEN) 10 MG tablet, Take 10 mg by mouth at bedtime as needed. For insomnia, Disp: , Rfl:   Review of Systems:  Constitutional: Denies fever, chills, diaphoresis, appetite change and fatigue.  HEENT: Denies photophobia, eye pain, redness, hearing loss, ear pain, congestion, sore throat, rhinorrhea, sneezing, mouth sores, trouble swallowing, neck pain, neck stiffness and tinnitus.   Respiratory: Denies SOB, DOE, cough, chest tightness,  and wheezing.   Cardiovascular: Denies chest pain, palpitations and leg swelling.  Gastrointestinal: Denies nausea, vomiting, abdominal pain, diarrhea, constipation, blood in stool and abdominal distention.  Genitourinary: Denies dysuria, urgency, frequency, hematuria, flank pain and difficulty urinating.  Endocrine: Denies: hot or cold intolerance, sweats, changes in hair or nails, polyuria, polydipsia. Musculoskeletal: Denies myalgias, back pain, joint swelling, arthralgias and gait problem.  Skin: Denies pallor, rash and wound.  Neurological: Denies dizziness, seizures, syncope, weakness, light-headedness, numbness and headaches.  Hematological: Denies adenopathy. Easy bruising, personal or family bleeding history  Psychiatric/Behavioral: Denies suicidal ideation, mood changes, confusion, nervousness, sleep disturbance and  agitation    Physical Exam: Vitals:   11/22/18 1058  BP: 130/70  Pulse: 74  Temp: 98.1 F (36.7 C)  TempSrc: Oral  SpO2: 97%  Weight: 219 lb 9.6 oz (99.6 kg)  Height: 6' (1.829 m)    Body mass index is 29.78 kg/m.   Constitutional: NAD, calm, comfortable Eyes: PERRL, lids and conjunctivae normal ENMT: Mucous membranes are moist.  Respiratory: clear to auscultation bilaterally, no wheezing, no crackles. Normal respiratory effort. No accessory muscle use.  Cardiovascular: Regular rate and rhythm, no murmurs / rubs / gallops. No extremity edema. 2+ pedal pulses. No carotid bruits.  Abdomen: no tenderness, no masses palpated. No hepatosplenomegaly. Bowel sounds positive.  Musculoskeletal: no clubbing / cyanosis. No joint deformity upper and lower extremities. Good ROM, no contractures. Normal muscle tone.  Psychiatric: Normal judgment and insight. Alert and oriented x 3. Normal mood.    Impression and Plan:  Well controlled type 2 diabetes mellitus with peripheral circulatory disorder ([place]) -A1c today demonstrates excellent control at 6.3. -Have continue to enforce lifestyle modifications. -He will continue basal bolus insulin.  Mixed hyperlipidemia -Is on statin and vibrate, last LDL was 77 in July 2017, will recheck lipids at next visit fasting.  DEPRESSION, SITUATIONAL, ACUTE/insomnia -He takes Ativan at nighttime for insomnia, sometimes adds an additional Ativan during the day, has been kept on this regimen by his prior PCP, he and his wife have custody of a young grandson and this comes with challenges. -Will refill Ativan today  Essential hypertension -Well-controlled, continue lisinopril     Patient Instructions  -Nice meeting you today!  -Schedule follow up in 6 months.  -Schedule follow up with Raynelle Dick for your annual medicare wellness visit.     Lelon Frohlich, MD [facility]

## 2018-11-27 ENCOUNTER — Encounter (INDEPENDENT_AMBULATORY_CARE_PROVIDER_SITE_OTHER): Payer: Self-pay | Admitting: Physical Medicine and Rehabilitation

## 2018-11-27 ENCOUNTER — Ambulatory Visit (INDEPENDENT_AMBULATORY_CARE_PROVIDER_SITE_OTHER): Payer: Medicare Other | Admitting: Physical Medicine and Rehabilitation

## 2018-11-27 VITALS — BP 146/67 | HR 72

## 2018-11-27 DIAGNOSIS — C434 Malignant melanoma of scalp and neck: Secondary | ICD-10-CM | POA: Diagnosis not present

## 2018-11-27 DIAGNOSIS — M7918 Myalgia, other site: Secondary | ICD-10-CM | POA: Diagnosis not present

## 2018-11-27 DIAGNOSIS — M542 Cervicalgia: Secondary | ICD-10-CM | POA: Diagnosis not present

## 2018-11-27 DIAGNOSIS — M792 Neuralgia and neuritis, unspecified: Secondary | ICD-10-CM | POA: Diagnosis not present

## 2018-11-27 NOTE — Progress Notes (Signed)
  Numeric Pain Rating Scale and Functional Assessment Average Pain 7   In the last MONTH (on 0-10 scale) has pain interfered with the following?  1. General activity like being  able to carry out your everyday physical activities such as walking, climbing stairs, carrying groceries, or moving a chair?  Rating(3)

## 2018-12-04 ENCOUNTER — Encounter (INDEPENDENT_AMBULATORY_CARE_PROVIDER_SITE_OTHER): Payer: Self-pay | Admitting: Physical Medicine and Rehabilitation

## 2018-12-04 DIAGNOSIS — M7918 Myalgia, other site: Secondary | ICD-10-CM | POA: Diagnosis not present

## 2018-12-04 DIAGNOSIS — C434 Malignant melanoma of scalp and neck: Secondary | ICD-10-CM | POA: Diagnosis not present

## 2018-12-04 DIAGNOSIS — M792 Neuralgia and neuritis, unspecified: Secondary | ICD-10-CM | POA: Diagnosis not present

## 2018-12-04 DIAGNOSIS — M542 Cervicalgia: Secondary | ICD-10-CM | POA: Diagnosis not present

## 2018-12-04 MED ORDER — TRIAMCINOLONE ACETONIDE 40 MG/ML IJ SUSP
20.0000 mg | INTRAMUSCULAR | Status: AC | PRN
  Administered 2018-12-04: 20 mg via INTRAMUSCULAR

## 2018-12-04 MED ORDER — LIDOCAINE HCL 1 % IJ SOLN
3.0000 mL | INTRAMUSCULAR | Status: AC | PRN
  Administered 2018-12-04: 3 mL

## 2018-12-04 NOTE — Progress Notes (Signed)
Brent [name]. - 73 y.o. male MRN [id number]  Date of birth: 10/11/1945  Office Visit Note: Visit Date: 11/27/2018 PCP: [name], [name], MD Referred by: [name], Estel*  Subjective: Chief Complaint  Patient presents with  . Neck - Pain   HPI: Brent [name]. is a 73 y.o. male who comes in today For review of cervical spine MRI and soft tissue MRI of the neck.  This is reviewed with the patient and reviewed below.  Luckily it does not show any new signs of invasion of melanoma and everything looks clear from that standpoint.  The radiologist pay particular attention to the nerves of the greater auricular branches and did not see any indication of any compression or scar tissue around the nerve.  Patient continues to have pain with the cervical spine with some referral to the left jawline postauricular area and a pretty convincing auricular nerve distribution.  He reports the use of Pennsaid2 helping and he did get a bottle to continue to use.  He now reports some pain in the upper shoulder blade area on the left with some referral pattern into the neck.  He has had no other new issues no other new headaches or other trauma or any other red flag symptoms.  Review of Systems  Constitutional: Negative for chills, fever, malaise/fatigue and weight loss.  HENT: Negative for hearing loss and sinus pain.   Eyes: Negative for blurred vision, double vision and photophobia.  Respiratory: Negative for cough and shortness of breath.   Cardiovascular: Negative for chest pain, palpitations and leg swelling.  Gastrointestinal: Negative for abdominal pain, nausea and vomiting.  Genitourinary: Negative for flank pain.  Musculoskeletal: Positive for neck pain. Negative for myalgias.  Skin: Negative for itching and rash.  Neurological: Negative for tremors, focal weakness and weakness.  Endo/Heme/Allergies: Negative.   Psychiatric/Behavioral: Negative for depression.  All other  systems reviewed and are negative.  Otherwise per HPI.  Assessment & Plan: Visit Diagnoses:  1. Cervicalgia   2. Myofascial pain syndrome   3. Neuralgia and neuritis   4. Malignant melanoma of left side of neck ([place])     Plan: Findings:  Worsening neck pain and shoulder blade pain with referral pain from the neck to the left jaw area and posterior auricular area.  MRI has been done and is reassuring.  He has doing somewhat better overall.  Today were going to complete greater auricular nerve block as well as trigger point injection in the left levator scapular region.  He will continue to use the Pennsaid2.  We will see him back as needed.  Would expect potential for serial nerve block and then depending on how he is doing with that referral potentially for something longer lasting if it is really felt to be a nerve issue.    Meds & Orders: No orders of the defined types were placed in this encounter.   Orders Placed This Encounter  Procedures  . Trigger Point Inj    Follow-up: Return if symptoms worsen or fail to improve.   Procedures: Trigger Point Inj Date/Time: 12/04/2018 5:53 AM Performed by: Magnus Sinning, MD Authorized by: Magnus Sinning, MD   Consent Given by:  Patient Site marked: the procedure site was marked   Timeout: prior to procedure the correct patient, procedure, and site was verified   Total # of Trigger Points:  3 or more Location: neck and back   Needle Size:  25 G Approach:  Dorsal Medications #1:  20 mg triamcinolone acetonide 40 MG/ML Medications #2:  3 mL lidocaine 1 % Additional Injections?: No   Patient tolerance:  Patient tolerated the procedure well with no immediate complications Comments: Trigger point located in the left levator scapula trapezius area and this was completed using triggering and needling technique.   Greater Auricular Nerve Block  Patient: Brent [name].      Date of Birth: 27-Jan-1946 MRN: [id number] PCP: [name], [name], MD      Visit Date: 11/27/2018   Universal Protocol:    Date/Time: 03/17/206:03 AM  Consent Given By: the patient  Position: supine  Additional Comments: Vital signs were monitored before and after the procedure. Patient was prepped and draped in the usual sterile fashion. The correct patient, procedure, and site was verified.   Injection Procedure Details:  Procedure Site One Meds Administered: 40 mg of Depo-Medrol with a mixture of 1% lidocaine without epinephrine and 0.25% Marcaine.  Laterality: Left  Location/Site: Greater auricular nerve located along the posterior edge of the sternocleidomastoid muscle one third of the way inferior from the mastoid to the insertion point on the clavicle.   Needle size: 25 G  Findings: Patient did seem to have some anesthesia along the posterior auricular area after the block.    Procedure Details: The patient's head was rotated to the right and the sternocleidomastoid was palpated from the left mastoid down to the clavicle.  The target area located approximately one third of the way down from the mastoid along this distance and along the posterior or dorsal edge was located and was then prepped in a sterile fashion.  Local anesthesia was provided by copious use of ethyl chloride and then a 25 gauge 1.5 in. needle was then advance perpendicular to the dorsal edge of the sternocleidomastoid at this target area. The needle is redirected superiorly and inferiorly and 28ml of injectate was delivered in several aliquots in a fan-like pattern.   Additional Comments:  The patient tolerated the procedure well Dressing: Band-Aid    Post-procedure details: Patient was observed during the procedure. Post-procedure instructions were reviewed.  Patient left the clinic in stable condition.   No notes on file   Clinical History: CT CERVICAL SPINE FINDINGS  There is no fracture or spondylolisthesis. Prevertebral soft tissues  and predental space regions are normal. There is mild disc space narrowing at C4-5 with slightly greater narrowing at C5-6 and C6-7. There are prominent anterior osteophytes at C5 and C6. There is facet hypertrophy at multiple levels without nerve root edema or effacement. No disc extrusion or high-grade stenosis. There are foci of carotid artery calcification bilaterally.  IMPRESSION: CT head: Mild sulcal atrophy diffusely. The ventricles appear normal. There is a posterior fossa arachnoid cyst with remodeling of cerebellar hemispheres, a chronic finding, more pronounced on the right than on the left. No intracranial hemorrhage, extra-axial fluid collection, or edema. No acute infarct evident.  CT cervical spine: Osteoarthritic change at multiple levels. No fracture or spondylolisthesis. Scattered foci of carotid artery calcification.   Electronically Signed   By: Lowella Grip III M.D.   On: 07/03/2015 13:09   He reports that he quit smoking about 38 years ago. His smoking use included cigarettes. He has a 45.00 pack-year smoking history. He has never used smokeless tobacco.  Recent Labs    05/24/18 1235 11/22/18 1108  HGBA1C 6.0* 6.3*    Objective:  VS:  HT:    WT:  BMI:     BP:(!) 146/67  HR:72bpm  TEMP: ( )  RESP:  Physical Exam Vitals signs and nursing note reviewed.  Constitutional:      General: He is not in acute distress.    Appearance: Normal appearance. He is well-developed.  HENT:     Head: Normocephalic and atraumatic.  Eyes:     Conjunctiva/sclera: Conjunctivae normal.     Pupils: Pupils are equal, round, and reactive to light.  Neck:     Musculoskeletal: Normal range of motion and neck supple. Muscular tenderness present. No neck rigidity.  Cardiovascular:     Rate and Rhythm: Normal rate.     Pulses: Normal pulses.     Heart sounds: Normal heart sounds.  Pulmonary:     Effort: Pulmonary effort is normal. No respiratory distress.   Musculoskeletal:     Right lower leg: No edema.     Left lower leg: No edema.     Comments: Examination of the cervical spine shows extensive post incision area that is well-healed.  There is no induration or redness or swelling.  He does have positive trigger point in the left levator scapular trapezius area but does reproduce some of his pain.  He also has areas of tenderness in the paraspinal muscles that also seem to reproduce some of the pain.  He has a negative Tinel's but he is tender over the auricular nerve area.  He has some painful range of motion to left rotation.  He has good strength in the upper extremities without deficit.  Negative Hoffmann's.  Skin:    General: Skin is warm and dry.     Findings: No erythema or rash.  Neurological:     General: No focal deficit present.     Mental Status: He is alert and oriented to person, place, and time.     Sensory: No sensory deficit.     Coordination: Coordination normal.     Gait: Gait normal.  Psychiatric:        Mood and Affect: Mood normal.        Behavior: Behavior normal.     Ortho Exam Imaging: No results found.  Past Medical/Family/Surgical/Social History: Medications & Allergies reviewed per EMR, new medications updated. Patient Active Problem List   Diagnosis Date Noted  . Malignant melanoma of left side of neck ([place]) 10/25/2018  . Facial neuritis 10/25/2018  . Myofascial pain syndrome 10/25/2018  . Occipital neuralgia of left side 10/25/2018  . CAP (community acquired pneumonia) 09/08/2017  . Viral URI with cough 08/02/2017  . Male hypogonadism 06/20/2016  . Renal lesion 06/20/2016  . Insomnia 08/07/2015  . Enlarged prostate with lower urinary tract symptoms (LUTS) 11/07/2014  . Balanitis 07/30/2012  . Bladder neck obstruction 07/30/2012  . Prostate nodule 06/18/2012  . Recurrent nephrolithiasis 06/18/2012  . Urinary urgency 06/18/2012  . Benign neoplasm of colon 08/29/2011  . DEPRESSION, SITUATIONAL,  ACUTE 01/23/2010  . Essential hypertension 05/30/2009  . Coronary atherosclerosis 05/30/2009  . Obstructive sleep apnea 11/28/2008  . OBESITY 09/23/2008  . ERECTILE DYSFUNCTION 11/26/2007  . Well controlled type 2 diabetes mellitus with peripheral circulatory disorder ([place]) 05/09/2007  . Hyperlipidemia 05/09/2007  . MYOCARDIAL INFARCTION, HX OF 05/09/2007  . DIVERTICULOSIS, COLON 05/09/2007   Past Medical History:  Diagnosis Date  . Allergy   . CAD (coronary artery disease)    a. angioplasty of his RCA in 1990. b. bare metal stent placed in the RCA in 2000 followed by rotational atherectomy shortly  after for stent restenosis. c. last cath was in 2012 showing stable moderate diffuse CAD. (70% mid LAD, 80% diagonal, 70% Ramus, 40% mid to distal RCA stent restenosis). d. Low risk nuc in 2015.  . Diabetes mellitus   . Diverticulosis   . Erectile dysfunction   . Hemorrhoids   . HTN (hypertension)   . Hyperlipidemia   . Malignant melanoma of left side of neck ([place]) 10/25/2018  . Myocardial infarction ([place])   . Obesity   . OSA (obstructive sleep apnea)   . Persistent disorder of initiating or maintaining sleep   . Personal history of colonic polyps 02/05/2003   Family History  Problem Relation Age of Onset  . Liver cancer Father   . Lung cancer Father   . Heart disease Father   . Heart disease Sister   . Lung cancer Mother   . COPD Mother   . Obesity Mother   . Colon cancer Neg Hx    Past Surgical History:  Procedure Laterality Date  . CHOLECYSTECTOMY    . CORONARY ANGIOPLASTY    . CORONARY STENT PLACEMENT     stenting of the right coronary artery with followup rotational  atherectomy.   Marland Kitchen FINGER SURGERY     right  . FOOT SURGERY     right  . INGUINAL HERNIA REPAIR     right  . ORTHOPEDIC SURGERY     foot right  . rotator cuff surg     Bil   Social History   Occupational History  . Not on file  Tobacco Use  . Smoking status: Former Smoker    Packs/day: 1.50     Years: 30.00    Pack years: 45.00    Types: Cigarettes    Last attempt to quit: 09/19/1980    Years since quitting: 38.2  . Smokeless tobacco: Never Used  Substance and Sexual Activity  . Alcohol use: Yes    Alcohol/week: 7.0 - 14.0 standard drinks    Types: 7 - 14 Shots of liquor per week  . Drug use: No  . Sexual activity: Not on file

## 2018-12-07 ENCOUNTER — Telehealth: Payer: Self-pay

## 2018-12-07 ENCOUNTER — Telehealth (INDEPENDENT_AMBULATORY_CARE_PROVIDER_SITE_OTHER): Payer: Self-pay | Admitting: Physical Medicine and Rehabilitation

## 2018-12-07 NOTE — Telephone Encounter (Signed)
Patient states pain is at least at a level of 8/10. Scheduled for Monday 3/23 at 1330.

## 2018-12-07 NOTE — Telephone Encounter (Signed)
Author phoned pt. to assess interest in rescheduling awv to limit COVID exposure in clinic, and/or be seen virtually through e-visit for AWV. Pt. Confirmed that he has a camera on his computer with internet access, and is open to doing AWV virtually. Appointment tentatively rescheduled for 3/27. Author stated she would touch base with pt. on Friday one way or another in event there is a Teaching laboratory technician with visit set-up, and pt. Verbalized understanding.

## 2018-12-07 NOTE — Telephone Encounter (Signed)
If bad enough can see him Monday, let him we are trying to limit due to coronavirus

## 2018-12-10 ENCOUNTER — Ambulatory Visit: Payer: Medicare Other

## 2018-12-10 ENCOUNTER — Ambulatory Visit (INDEPENDENT_AMBULATORY_CARE_PROVIDER_SITE_OTHER): Payer: Medicare Other | Admitting: Physical Medicine and Rehabilitation

## 2018-12-10 ENCOUNTER — Encounter (INDEPENDENT_AMBULATORY_CARE_PROVIDER_SITE_OTHER): Payer: Self-pay | Admitting: Physical Medicine and Rehabilitation

## 2018-12-10 ENCOUNTER — Other Ambulatory Visit: Payer: Self-pay

## 2018-12-10 DIAGNOSIS — I2510 Atherosclerotic heart disease of native coronary artery without angina pectoris: Secondary | ICD-10-CM

## 2018-12-10 DIAGNOSIS — G8929 Other chronic pain: Secondary | ICD-10-CM | POA: Diagnosis not present

## 2018-12-10 DIAGNOSIS — Z87891 Personal history of nicotine dependence: Secondary | ICD-10-CM | POA: Diagnosis not present

## 2018-12-10 DIAGNOSIS — I119 Hypertensive heart disease without heart failure: Secondary | ICD-10-CM

## 2018-12-10 DIAGNOSIS — M7918 Myalgia, other site: Secondary | ICD-10-CM

## 2018-12-10 DIAGNOSIS — M25512 Pain in left shoulder: Secondary | ICD-10-CM

## 2018-12-10 DIAGNOSIS — E119 Type 2 diabetes mellitus without complications: Secondary | ICD-10-CM | POA: Diagnosis not present

## 2018-12-10 DIAGNOSIS — M542 Cervicalgia: Secondary | ICD-10-CM

## 2018-12-10 DIAGNOSIS — M4802 Spinal stenosis, cervical region: Secondary | ICD-10-CM | POA: Diagnosis not present

## 2018-12-10 MED ORDER — METHYLPREDNISOLONE ACETATE 40 MG/ML IJ SUSP
40.0000 mg | INTRAMUSCULAR | Status: AC | PRN
  Administered 2018-12-10: 40 mg via INTRAMUSCULAR

## 2018-12-10 MED ORDER — TIZANIDINE HCL 4 MG PO TABS: 2.0000 mg | ORAL_TABLET | Freq: Every day | ORAL | 0 refills | Status: DC

## 2018-12-10 MED ORDER — LIDOCAINE HCL (PF) 1 % IJ SOLN
3.0000 mL | INTRAMUSCULAR | Status: AC | PRN
  Administered 2018-12-10: 3 mL

## 2018-12-10 NOTE — Progress Notes (Signed)
Brent [name]. - 73 y.o. male MRN [id number]  Date of birth: 09-11-1946  Office Visit Note: Visit Date: 12/10/2018 PCP: [name], [name], MD Referred by: [name], Estel*  Subjective: Chief Complaint  Patient presents with  . Neck - Pain  . Left Shoulder - Pain   HPI: Brent Can. is a 73 y.o. male who comes in today For reevaluation of his left-sided neck and upper back and left shoulder pain.  The last time we saw the patient we completed left greater auricular nerve block as well as trigger point injection which was felt to be more of the levator scapular region.  He reports he is better overall than he has been.  He still using Pennsaid2 and some over-the-counter salves.  He now states he has a hard time abducting his left arm up without a lot of pain.  The pain is not over the lateral shoulder or into the arm or hand.  He has no numbness tingling or paresthesia.  He says if he moves the right arm he actually feels it in the left upper back.  He has no right arm pain.  He is left-hand dominant.  He feels like there is a significant knot that he can press and it reproduces most of his pain.  I can feel this today and is pretty significant.  It seems to be probably more of a supraspinatus muscle versus trapezius.  He has had no other issues.  No specific headache.  Still pain referring to the jaw at times.  This seems to be myofascial.  Review of Systems  Constitutional: Negative for chills, fever, malaise/fatigue and weight loss.  HENT: Negative for hearing loss and sinus pain.   Eyes: Negative for blurred vision, double vision and photophobia.  Respiratory: Negative for cough and shortness of breath.   Cardiovascular: Negative for chest pain, palpitations and leg swelling.  Gastrointestinal: Negative for abdominal pain, nausea and vomiting.  Genitourinary: Negative for flank pain.  Musculoskeletal: Positive for joint pain and neck pain. Negative for  myalgias.  Skin: Negative for itching and rash.  Neurological: Negative for tremors, focal weakness and weakness.  Endo/Heme/Allergies: Negative.   Psychiatric/Behavioral: Negative for depression.  All other systems reviewed and are negative.  Otherwise per HPI.  Assessment & Plan: Visit Diagnoses:  1. Myofascial muscle pain   2. Cervicalgia   3. Chronic left shoulder pain   4. Spinal stenosis of cervical region     Plan: Findings:  Chronic worsening left-sided neck pain some referral to the left jawline here but also pain into the upper part of the shoulder and anterior clavicle but not laterally.  Exam does not really reveal any impingement syndrome although he does have some limited range of motion with external rotation internal rotation of the left shoulder.  I do feel like this is myofascial pain and trigger points really more than anything else.  I did review the MRI of the cervical spine which is really more of a soft tissue MRI but it does show the spine fairly well.  There is one area of some central stenosis but is mild.  Today we did repeat trigger point injection but slightly in a different muscle which I think is supraspinatus or trapezius.  Hopefully will get some relief with that.  I have given him some exercises to do and will get a try to see him back hopefully several weeks from now if we need to given the  current situation with the Sars Cov2 virus.    Meds & Orders:  Meds ordered this encounter  Medications  . tiZANidine (ZANAFLEX) 4 MG tablet    Sig: Take 0.5-1 tablets (2-4 mg total) by mouth at bedtime.    Dispense:  60 tablet    Refill:  0    Orders Placed This Encounter  Procedures  . Trigger Point Inj    Follow-up: Return if symptoms worsen or fail to improve.   Procedures: Trigger Point Inj Date/Time: 12/10/2018 2:46 PM Performed by: Magnus Sinning, MD Authorized by: Magnus Sinning, MD   Consent Given by:  Patient Site marked: the procedure site  was marked   Timeout: prior to procedure the correct patient, procedure, and site was verified   Indications:  Pain Total # of Trigger Points:  1 Location: neck and back   Needle Size:  25 G Approach:  Dorsal Medications #1:  40 mg methylPREDNISolone acetate 40 MG/ML; 3 mL lidocaine (PF) 1 % Patient tolerance:  Patient tolerated the procedure well with no immediate complications    No notes on file   Clinical History: CT CERVICAL SPINE FINDINGS  There is no fracture or spondylolisthesis. Prevertebral soft tissues and predental space regions are normal. There is mild disc space narrowing at C4-5 with slightly greater narrowing at C5-6 and C6-7. There are prominent anterior osteophytes at C5 and C6. There is facet hypertrophy at multiple levels without nerve root edema or effacement. No disc extrusion or high-grade stenosis. There are foci of carotid artery calcification bilaterally.  IMPRESSION: CT head: Mild sulcal atrophy diffusely. The ventricles appear normal. There is a posterior fossa arachnoid cyst with remodeling of cerebellar hemispheres, a chronic finding, more pronounced on the right than on the left. No intracranial hemorrhage, extra-axial fluid collection, or edema. No acute infarct evident.  CT cervical spine: Osteoarthritic change at multiple levels. No fracture or spondylolisthesis. Scattered foci of carotid artery calcification.   Electronically Signed   By: Lowella Grip III M.D.   On: 07/03/2015 13:09   He reports that he quit smoking about 38 years ago. His smoking use included cigarettes. He has a 45.00 pack-year smoking history. He has never used smokeless tobacco.  Recent Labs    05/24/18 1235 11/22/18 1108  HGBA1C 6.0* 6.3*    Objective:  VS:  HT:    WT:   BMI:     BP:   HR: bpm  TEMP: ( )  RESP:  Physical Exam Vitals signs and nursing note reviewed.  Constitutional:      General: He is not in acute distress.    Appearance: He  is well-developed.  HENT:     Head: Normocephalic and atraumatic.     Nose: Nose normal.     Mouth/Throat:     Mouth: Mucous membranes are moist.     Pharynx: Oropharynx is clear.  Eyes:     Conjunctiva/sclera: Conjunctivae normal.     Pupils: Pupils are equal, round, and reactive to light.  Neck:     Musculoskeletal: Normal range of motion and neck supple.     Trachea: No tracheal deviation.  Cardiovascular:     Rate and Rhythm: Normal rate and regular rhythm.     Pulses: Normal pulses.  Pulmonary:     Effort: Pulmonary effort is normal.     Breath sounds: Normal breath sounds.  Abdominal:     General: There is no distension.     Palpations: Abdomen is soft.  Tenderness: There is no guarding or rebound.  Musculoskeletal:        General: No deformity.     Right lower leg: No edema.     Left lower leg: No edema.     Comments: Examination of the cervical spine shows forward flexed cervical spine with increased prominence of C7 spinous process.  There is a focal trigger point in the left supraspinatus trapezius area that does reproduce a lot of his pain.  There is well-healed superficial scar on the left posterior neck.  Examination of both shoulder show that there is some decreased range of motion with external rotation more than internal rotation but this does not reproduce his pain.  There is no crepitus noted.  Fairly fluid motion otherwise.  Good strength in the upper extremities.  Skin:    General: Skin is warm and dry.     Findings: No erythema or rash.  Neurological:     General: No focal deficit present.     Mental Status: He is alert and oriented to person, place, and time.     Motor: No abnormal muscle tone.     Coordination: Coordination normal.     Gait: Gait normal.  Psychiatric:        Mood and Affect: Mood normal.        Behavior: Behavior normal.        Thought Content: Thought content normal.     Ortho Exam Imaging: No results found.  Past  Medical/Family/Surgical/Social History: Medications & Allergies reviewed per EMR, new medications updated. Patient Active Problem List   Diagnosis Date Noted  . Malignant melanoma of left side of neck ([place]) 10/25/2018  . Facial neuritis 10/25/2018  . Myofascial pain syndrome 10/25/2018  . Occipital neuralgia of left side 10/25/2018  . CAP (community acquired pneumonia) 09/08/2017  . Viral URI with cough 08/02/2017  . Male hypogonadism 06/20/2016  . Renal lesion 06/20/2016  . Insomnia 08/07/2015  . Enlarged prostate with lower urinary tract symptoms (LUTS) 11/07/2014  . Balanitis 07/30/2012  . Bladder neck obstruction 07/30/2012  . Prostate nodule 06/18/2012  . Recurrent nephrolithiasis 06/18/2012  . Urinary urgency 06/18/2012  . Benign neoplasm of colon 08/29/2011  . DEPRESSION, SITUATIONAL, ACUTE 01/23/2010  . Essential hypertension 05/30/2009  . Coronary atherosclerosis 05/30/2009  . Obstructive sleep apnea 11/28/2008  . OBESITY 09/23/2008  . ERECTILE DYSFUNCTION 11/26/2007  . Well controlled type 2 diabetes mellitus with peripheral circulatory disorder ([place]) 05/09/2007  . Hyperlipidemia 05/09/2007  . MYOCARDIAL INFARCTION, HX OF 05/09/2007  . DIVERTICULOSIS, COLON 05/09/2007   Past Medical History:  Diagnosis Date  . Allergy   . CAD (coronary artery disease)    a. angioplasty of his RCA in 1990. b. bare metal stent placed in the RCA in 2000 followed by rotational atherectomy shortly after for stent restenosis. c. last cath was in 2012 showing stable moderate diffuse CAD. (70% mid LAD, 80% diagonal, 70% Ramus, 40% mid to distal RCA stent restenosis). d. Low risk nuc in 2015.  . Diabetes mellitus   . Diverticulosis   . Erectile dysfunction   . Hemorrhoids   . HTN (hypertension)   . Hyperlipidemia   . Malignant melanoma of left side of neck ([place]) 10/25/2018  . Myocardial infarction ([place])   . Obesity   . OSA (obstructive sleep apnea)   . Persistent disorder of initiating or  maintaining sleep   . Personal history of colonic polyps 02/05/2003   Family History  Problem Relation Age of Onset  .  Liver cancer Father   . Lung cancer Father   . Heart disease Father   . Heart disease Sister   . Lung cancer Mother   . COPD Mother   . Obesity Mother   . Colon cancer Neg Hx    Past Surgical History:  Procedure Laterality Date  . CHOLECYSTECTOMY    . CORONARY ANGIOPLASTY    . CORONARY STENT PLACEMENT     stenting of the right coronary artery with followup rotational  atherectomy.   Marland Kitchen FINGER SURGERY     right  . FOOT SURGERY     right  . INGUINAL HERNIA REPAIR     right  . ORTHOPEDIC SURGERY     foot right  . rotator cuff surg     [place]il   Social History   Occupational History  . Not on file  Tobacco Use  . Smoking status: Former Smoker    Packs/day: 1.50    Years: 30.00    Pack years: 45.00    Types: Cigarettes    Last attempt to quit: 09/19/1980    Years since quitting: 38.2  . Smokeless tobacco: Never Used  Substance and Sexual Activity  . Alcohol use: Yes    Alcohol/week: 7.0 - 14.0 standard drinks    Types: 7 - 14 Shots of liquor per week  . Drug use: No  . Sexual activity: Not on file

## 2018-12-10 NOTE — Progress Notes (Signed)
Pain from top of left shoulder up to jaw. Cannot lift left arm over head. Icy hot and Pennsaid help relieve the pain. Left hand dominant.  Numeric Pain Rating Scale and Functional Assessment Average Pain 5   In the last MONTH (on 0-10 scale) has pain interfered with the following?  1. General activity like being  able to carry out your everyday physical activities such as walking, climbing stairs, carrying groceries, or moving a chair?  Rating(10)

## 2018-12-12 ENCOUNTER — Telehealth: Payer: Self-pay

## 2018-12-12 NOTE — Telephone Encounter (Signed)
Author phoned pt.'s mobile to notify that email had been sent from [place], and to check spam folder for email. Pt. Did not answer, detailed VM left. Will re-send invite to email on file. Author instructed pt. To dial main [place] number if he still does not receive or see email from sender "[email or web address]".

## 2018-12-12 NOTE — Telephone Encounter (Signed)
Copied from [place] [id number]. Topic: General - Inquiry >> Dec 11, 2018 12:56 PM Scherrie Gerlach wrote: Reason for CRM:  pt calling back about his appt on Friday.  He was clear on the virtual AWV visit.  Advised he would get an email with instructions.  Pt email confirmed. Pt verbalized understanding. >> Dec 11, 2018  1:00 PM Scherrie Gerlach wrote: Pt has not received invite yet

## 2018-12-13 NOTE — Progress Notes (Signed)
Subjective:   Brent Munch. is a 73 y.o. male who presents for Medicare Annual/Subsequent preventive examination.  I connected with Noralee Stain on 12/14/18 at 10:00 AM EDT by a video enabled telemedicine application and verified that I am speaking with the correct person using two identifiers.   Review of Systems:  No ROS.  Medicare Wellness Visit. Additional risk factors are reflected in the social history.  Cardiac Risk Factors include: advanced age (>43men, >49 women);male gender;sedentary lifestyle;dyslipidemia;diabetes mellitus;hypertension Sleep patterns: has interrupted sleep. Sees sleep doctor through [place].   Home Safety/Smoke Alarms: Feels safe in home. Smoke alarms in place.  Living environment; residence and Firearm Safety: No use or need for DME at this time. . Seat Belt Safety/Bike Helmet: Wears seat belt.  Male:   CCS- 01/2017, no need for repeat per Dr. Loletha Carrow report PSA- No results found for: PSA      Objective:    Vitals: There were no vitals taken for this visit.  There is no height or weight on file to calculate BMI.  Advanced Directives 12/14/2018 11/11/2018 07/05/2017 01/25/2017 01/11/2017 07/03/2015  Does Patient Have a Medical Advance Directive? (No Data) Yes Yes Yes Yes No  Type of Advance Directive - [place];Living will - [place];Living will [place];Living will -  Does patient want to make changes to medical advance directive? - No - Patient declined - - - -  Copy of [place] in Chart? - No - copy requested - Yes No - copy requested -  Would patient like information on creating a medical advance directive? - No - Patient declined - - - Yes - Scientist, clinical (histocompatibility and immunogenetics) given    Tobacco Social History   Tobacco Use  Smoking Status Former Smoker  . Packs/day: 1.50  . Years: 30.00  . Pack years: 45.00  . Types: Cigarettes  . Last attempt to quit: 09/19/1980  . Years since  quitting: 38.2  Smokeless Tobacco Never Used     Counseling given: Not Answered   Past Medical History:  Diagnosis Date  . Allergy   . CAD (coronary artery disease)    a. angioplasty of his RCA in 1990. b. bare metal stent placed in the RCA in 2000 followed by rotational atherectomy shortly after for stent restenosis. c. last cath was in 2012 showing stable moderate diffuse CAD. (70% mid LAD, 80% diagonal, 70% Ramus, 40% mid to distal RCA stent restenosis). d. Low risk nuc in 2015.  . Diabetes mellitus   . Diverticulosis   . Erectile dysfunction   . Hemorrhoids   . HTN (hypertension)   . Hyperlipidemia   . Malignant melanoma of left side of neck ([place]) 10/25/2018  . Myocardial infarction ([place])   . Obesity   . OSA (obstructive sleep apnea)   . Persistent disorder of initiating or maintaining sleep   . Personal history of colonic polyps 02/05/2003   Past Surgical History:  Procedure Laterality Date  . CHOLECYSTECTOMY    . CORONARY ANGIOPLASTY    . CORONARY STENT PLACEMENT     stenting of the right coronary artery with followup rotational  atherectomy.   Marland Kitchen FINGER SURGERY     right  . FOOT SURGERY     right  . INGUINAL HERNIA REPAIR     right  . melanoma removal    . ORTHOPEDIC SURGERY     foot right  . rotator cuff surg     Bil  Family History  Problem Relation Age of Onset  . Liver cancer Father   . Lung cancer Father   . Heart disease Father   . Heart disease Sister   . Lung cancer Mother   . COPD Mother   . Obesity Mother   . Colon cancer Neg Hx    Social History   Socioeconomic History  . Marital status: Married    Spouse name: Not on file  . Number of children: 1  . Years of education: Not on file  . Highest education level: Not on file  Occupational History  . Occupation: Teacher, English as a foreign language    Comment: [employer]  . Financial resource strain: Not hard at all  . Food insecurity:    Worry: Never true    Inability: Never true  . Transportation  needs:    Medical: No    Non-medical: No  Tobacco Use  . Smoking status: Former Smoker    Packs/day: 1.50    Years: 30.00    Pack years: 45.00    Types: Cigarettes    Last attempt to quit: 09/19/1980    Years since quitting: 38.2  . Smokeless tobacco: Never Used  Substance and Sexual Activity  . Alcohol use: Yes    Alcohol/week: 7.0 - 14.0 standard drinks    Types: 7 - 14 Shots of liquor per week  . Drug use: No  . Sexual activity: Not on file  Lifestyle  . Physical activity:    Days per week: 0 days    Minutes per session: 0 min  . Stress: Not at all  Relationships  . Social connections:    Talks on phone: Once a week    Gets together: Never    Attends religious service: Not on file    Active member of club or organization: Not on file    Attends meetings of clubs or organizations: Not on file    Relationship status: Married  Other Topics Concern  . Not on file  Social History Narrative   12/14/2018: Lives with wife and 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 (son had passed away several years ago)   Recevies much of his care through [place]   Enjoys golfing    Outpatient Encounter Medications as of 12/14/2018  Medication Sig  . aspirin 81 MG tablet Take 81 mg by mouth daily.  . Diclofenac Sodium (PENNSAID) 2 % SOLN Apply 2 pumps to affected area 2 times daily.  . empagliflozin (JARDIANCE) 25 MG TABS tablet Take 25 mg by mouth daily.  . fenofibrate 160 MG tablet Take 160 mg by mouth daily.  . fluticasone (FLONASE) 50 MCG/ACT nasal spray Place 2 sprays into both nostrils daily.  [name] Ethyl (VASCEPA) 1 g CAPS Take 2 capsules (2 g total) by mouth 2 (two) times daily.  . insulin aspart (NOVOLOG FLEXPEN) 100 UNIT/ML FlexPen Inject 40 Units 3 (three) times daily with meals into the skin.  Marland Kitchen insulin glargine (LANTUS) 100 unit/mL SOPN Inject 60 Units at bedtime into the skin.  Marland Kitchen lidocaine (XYLOCAINE) 2 % solution Use as directed 15 mLs in the mouth or throat as needed for mouth pain.  Marland Kitchen  lisinopril (PRINIVIL,ZESTRIL) 5 MG tablet Take 5 mg by mouth daily.  Marland Kitchen LORazepam (ATIVAN) 1 MG tablet TAKE 1 TABLET TWICE A DAY AS NEEDED FOR ANXIETY  . metFORMIN (GLUCOPHAGE) 1000 MG tablet Take 1,000 mg by mouth 2 (two) times daily with a meal.  . Multiple Vitamins-Minerals (CENTRUM SILVER PO) 1 tab  po qd   . oxybutynin (DITROPAN) 5 MG tablet Take 1 tablet by mouth daily.  . rosuvastatin (CRESTOR) 40 MG tablet Take 1 tablet (40 mg total) by mouth daily.  . sertraline (ZOLOFT) 100 MG tablet Take 150 mg by mouth daily. Total of 150mg  daily  . tiZANidine (ZANAFLEX) 4 MG tablet Take 0.5-1 tablets (2-4 mg total) by mouth at bedtime.  . vitamin B-12 (CYANOCOBALAMIN) 1000 MCG tablet Take 1,000 mcg by mouth daily.    Marland Kitchen zolpidem (AMBIEN) 10 MG tablet Take 10 mg by mouth at bedtime as needed. For insomnia  . ondansetron (ZOFRAN) 4 MG tablet Take 1 tablet (4 mg total) by mouth every 8 (eight) hours as needed for nausea or vomiting. (Patient not taking: Reported on 12/14/2018)   No facility-administered encounter medications on file as of 12/14/2018.     Activities of Daily Living In your present state of health, do you have any difficulty performing the following activities: 12/14/2018  Hearing? N  Vision? N  Difficulty concentrating or making decisions? N  Walking or climbing stairs? N  Dressing or bathing? N  Doing errands, shopping? N  Preparing Food and eating ? N  Using the Toilet? N  In the past six months, have you accidently leaked urine? N  Do you have problems with loss of bowel control? N  Managing your Medications? N  Managing your Finances? N  Housekeeping or managing your Housekeeping? N  Some recent data might be hidden    Patient Care Team: [name], [name], MD as PCP - General (Internal Medicine) Burnell Blanks, MD as PCP - Cardiology (Cardiology) Melissa Noon, [place] as Referring Physician (Optometry)   Assessment:   This is a routine wellness examination  for [place]. Physical assessment deferred to PCP.   Exercise Activities and Dietary recommendations Current Exercise Habits: The patient does not participate in regular exercise at present, Exercise limited by: psychological condition(s);cardiac condition(s) Diet (meal preparation, eat out, water intake, caffeinated beverages, dairy products, fruits and vegetables): diabetic. States he does a lot of carbohydrate counting, but has noticed his BG has been lower recently, getting down into 40s at times. He is able to tell when he is hypoglycemic however, and has OJ on hand. Author encouraged pt. to always keep sugar candy on hand, and consume protein with every meal, especially during a hypoglycemic episode so as to stabilize sugars. Pt. Stated he has not changed his diabetes medication regimine or diet recently. Will notify PCP.        Goals    . Patient Stated     Stay busy, and mentally healthy during 'shelter-in-place' pandemic. Reach out to counseling resources if needed.    . Weight (lb) < 235 lb (106.6 kg)     If you lose 10 to 12 lbs, you will lose 1in in your waist  Get back to the gym;  We know that 30 minutes will help you metabolically but 60 will burn calories Continue with your work out        Fall Risk Fall Risk  12/14/2018 11/22/2018 07/05/2017 04/17/2017 03/25/2015  Falls in the past year? 0 0 No Yes No  Comment - - - [facility] Telephone Survey: data to providers prior to load -  Number falls in past yr: - 0 - 1 -  Comment - - - [facility] Telephone Survey Actual Response = 1 -  Injury with Fall? - 0 - No -     Depression Screen PHQ 2/9 Scores  12/14/2018 11/22/2018 07/05/2017 03/25/2015  PHQ - 2 Score 4 1 0 1  PHQ- 9 Score 4 - - -   Pt. Acknowledged that he struggles with depression, now more with recent pandemic. It became obvious there were stressors at home but he was unable to speak much on them because both wife and grandson were present during webex visit. Pt. Denied suicidal  ideation. Behavioral health resource to be mailed to pt., pt. Receptive to that.   Cognitive Function MMSE - Mini Mental State Exam 07/05/2017  Not completed: (No Data)       Ad8 score reviewed for issues:  Issues making decisions: no  Less interest in hobbies / activities: no  Repeats questions, stories (family complaining): no  Trouble using ordinary gadgets (microwave, computer, phone):no  Forgets the month or year: no  Mismanaging finances: no  Remembering appts: no  Daily problems with thinking and/or memory: no Ad8 score is= 0    Immunization History  Administered Date(s) Administered  . Influenza Split 06/13/2011, 07/04/2012  . Influenza Whole 06/24/2008, 06/18/2009, 08/06/2010  . Influenza, High Dose Seasonal PF 07/29/2016, 07/05/2017, 05/24/2018  . Influenza,inj,Quad PF,6+ Mos 06/14/2013, 05/28/2014, 07/13/2015  . Pneumococcal Conjugate-13 09/22/2014  . Pneumococcal Polysaccharide-23 09/19/2002, 06/13/2011  . Td 09/19/2001  . Tdap 01/21/2014  . Zoster 09/20/2011    Qualifies for Shingles Vaccine? Yes, advised to receive at local pharmacy.   Screening Tests Health Maintenance  Topic Date Due  . FOOT EXAM  07/26/2018  . OPHTHALMOLOGY EXAM  04/05/2019  . HEMOGLOBIN A1C  05/25/2019  . COLONOSCOPY  01/25/2022  . TETANUS/TDAP  01/22/2024  . INFLUENZA VACCINE  Completed  . Hepatitis C Screening  Completed  . PNA vac Low Risk Adult  Completed         Plan:     I have personally reviewed and noted the following in the patient's chart:   . Medical and social history . Use of alcohol, tobacco or illicit drugs  . Current medications and supplements . Functional ability and status . Nutritional status . Physical activity . Advanced directives . List of other physicians . Vitals . Screenings to include cognitive, depression, and falls . Referrals and appointments  In addition, I have reviewed and discussed with patient certain preventive  protocols, quality metrics, and best practice recommendations. A written personalized care plan for preventive services as well as general preventive health recommendations were provided to patient.     Alphia Moh, RN  12/14/2018

## 2018-12-14 ENCOUNTER — Ambulatory Visit (INDEPENDENT_AMBULATORY_CARE_PROVIDER_SITE_OTHER): Payer: Medicare Other

## 2018-12-14 ENCOUNTER — Telehealth: Payer: Self-pay

## 2018-12-14 ENCOUNTER — Other Ambulatory Visit: Payer: Self-pay

## 2018-12-14 DIAGNOSIS — Z0000 Encounter for general adult medical examination without abnormal findings: Secondary | ICD-10-CM | POA: Diagnosis not present

## 2018-12-14 NOTE — Patient Instructions (Addendum)
[name] , Thank you for taking time to come for your Medicare Wellness Visit. I appreciate your ongoing commitment to your health goals. Please review the following plan we discussed and let me know if I can assist you in the future.   These are the goals we discussed: Goals    . Patient Stated     Stay busy, and mentally healthy during 'shelter-in-place' pandemic. Reach out to counseling resources if needed.    . Weight (lb) < 235 lb (106.6 kg)     If you lose 10 to 12 lbs, you will lose 1in in your waist  Get back to the gym;  We know that 30 minutes will help you metabolically but 60 will burn calories Continue with your work out        This is a list of the screening recommended for you and due dates:  Health Maintenance  Topic Date Due  . Complete foot exam   05/24/2019*  . Eye exam for diabetics  04/05/2019  . Hemoglobin A1C  05/25/2019  . Colon Cancer Screening  01/25/2022  . Tetanus Vaccine  01/22/2024  . Flu Shot  Completed  .  Hepatitis C: One time screening is recommended by Center for Disease Control  (CDC) for  adults born from [birth year] through 1967.   Completed  . Pneumonia vaccines  Completed  *Topic was postponed. The date shown is not the original due date.    Our records indicate that:  Your last colonoscopy was in May 2018. Per report from Dr. Loletha Carrow, there is no need to repeat unless you are having GI issues.  I will follow-up with Dr. Jerilee Hoh regarding your low blood sugar readings recently, and ask about getting any necessary labwork ordered prior to your appointment with her on 05/24/2019.  Refer to home and behavioral health resources provided.  We are all in this together, we can do it! Thank you for taking the time to do a webex visit with me! Let us know if you need anything [phone].   Diabetes Mellitus and Foot Care Foot care is an important part of your health, especially when you have diabetes. Diabetes may cause you to have problems  because of poor blood flow (circulation) to your feet and legs, which can cause your skin to:  Become thinner and drier.  Break more easily.  Heal more slowly.  Peel and crack. You may also have nerve damage (neuropathy) in your legs and feet, causing decreased feeling in them. This means that you may not notice minor injuries to your feet that could lead to more serious problems. Noticing and addressing any potential problems early is the best way to prevent future foot problems. How to care for your feet Foot hygiene  Wash your feet daily with warm water and mild soap. Do not use hot water. Then, pat your feet and the areas between your toes until they are completely dry. Do not soak your feet as this can dry your skin.  Trim your toenails straight across. Do not dig under them or around the cuticle. File the edges of your nails with an emery board or nail file.  Apply a moisturizing lotion or petroleum jelly to the skin on your feet and to dry, brittle toenails. Use lotion that does not contain alcohol and is unscented. Do not apply lotion between your toes. Shoes and socks  Wear clean socks or stockings every day. Make sure they are not too tight. Do not wear  knee-high stockings since they may decrease blood flow to your legs.  Wear shoes that fit properly and have enough cushioning. Always look in your shoes before you put them on to be sure there are no objects inside.  To break in new shoes, wear them for just a few hours a day. This prevents injuries on your feet. Wounds, scrapes, corns, and calluses  Check your feet daily for blisters, cuts, bruises, sores, and redness. If you cannot see the bottom of your feet, use a mirror or ask someone for help.  Do not cut corns or calluses or try to remove them with medicine.  If you find a minor scrape, cut, or break in the skin on your feet, keep it and the skin around it clean and dry. You may clean these areas with mild soap and  water. Do not clean the area with peroxide, alcohol, or iodine.  If you have a wound, scrape, corn, or callus on your foot, look at it several times a day to make sure it is healing and not infected. Check for: ? Redness, swelling, or pain. ? Fluid or blood. ? Warmth. ? Pus or a bad smell. General instructions  Do not cross your legs. This may decrease blood flow to your feet.  Do not use heating pads or hot water bottles on your feet. They may burn your skin. If you have lost feeling in your feet or legs, you may not know this is happening until it is too late.  Protect your feet from hot and cold by wearing shoes, such as at the beach or on hot pavement.  Schedule a complete foot exam at least once a year (annually) or more often if you have foot problems. If you have foot problems, report any cuts, sores, or bruises to your health care provider immediately. Contact a health care provider if:  You have a medical condition that increases your risk of infection and you have any cuts, sores, or bruises on your feet.  You have an injury that is not healing.  You have redness on your legs or feet.  You feel burning or tingling in your legs or feet.  You have pain or cramps in your legs and feet.  Your legs or feet are numb.  Your feet always feel cold.  You have pain around a toenail. Get help right away if:  You have a wound, scrape, corn, or callus on your foot and: ? You have pain, swelling, or redness that gets worse. ? You have fluid or blood coming from the wound, scrape, corn, or callus. ? Your wound, scrape, corn, or callus feels warm to the touch. ? You have pus or a bad smell coming from the wound, scrape, corn, or callus. ? You have a fever. ? You have a red line going up your leg. Summary  Check your feet every day for cuts, sores, red spots, swelling, and blisters.  Moisturize feet and legs daily.  Wear shoes that fit properly and have enough cushioning.   If you have foot problems, report any cuts, sores, or bruises to your health care provider immediately.  Schedule a complete foot exam at least once a year (annually) or more often if you have foot problems. This information is not intended to replace advice given to you by your health care provider. Make sure you discuss any questions you have with your health care provider. Document Released: 09/02/2000 Document Revised: 10/18/2017 Document Reviewed: 10/07/2016 Elsevier Interactive  Patient Education  2019 [place] Maintenance, Male A healthy lifestyle and preventive care is important for your health and wellness. Ask your health care provider about what schedule of regular examinations is right for you. What should I know about weight and diet? Eat a Healthy Diet  Eat plenty of vegetables, fruits, whole grains, low-fat dairy products, and lean protein.  Do not eat a lot of foods high in solid fats, added sugars, or salt.  Maintain a Healthy Weight Regular exercise can help you achieve or maintain a healthy weight. You should:  Do at least 150 minutes of exercise each week. The exercise should increase your heart rate and make you sweat (moderate-intensity exercise).  Do strength-training exercises at least twice a week. Watch Your Levels of Cholesterol and Blood Lipids  Have your blood tested for lipids and cholesterol every 5 years starting at 75 years of age. If you are at high risk for heart disease, you should start having your blood tested when you are 75 years old. You may need to have your cholesterol levels checked more often if: ? Your lipid or cholesterol levels are high. ? You are older than 75 years of age. ? You are at high risk for heart disease. What should I know about cancer screening? Many types of cancers can be detected early and may often be prevented. Lung Cancer  You should be screened every year for lung cancer if: ? You are a current smoker  who has smoked for at least 30 years. ? You are a former smoker who has quit within the past 15 years.  Talk to your health care provider about your screening options, when you should start screening, and how often you should be screened. Colorectal Cancer  Routine colorectal cancer screening usually begins at 75 years of age and should be repeated every 5-10 years until you are 75 years old. You may need to be screened more often if early forms of precancerous polyps or small growths are found. Your health care provider may recommend screening at an earlier age if you have risk factors for colon cancer.  Your health care provider may recommend using home test kits to check for hidden blood in the stool.  A small camera at the end of a tube can be used to examine your colon (sigmoidoscopy or colonoscopy). This checks for the earliest forms of colorectal cancer. Prostate and Testicular Cancer  Depending on your age and overall health, your health care provider may do certain tests to screen for prostate and testicular cancer.  Talk to your health care provider about any symptoms or concerns you have about testicular or prostate cancer. Skin Cancer  Check your skin from head to toe regularly.  Tell your health care provider about any new moles or changes in moles, especially if: ? There is a change in a mole's size, shape, or color. ? You have a mole that is larger than a pencil eraser.  Always use sunscreen. Apply sunscreen liberally and repeat throughout the day.  Protect yourself by wearing long sleeves, pants, a wide-brimmed hat, and sunglasses when outside. What should I know about heart disease, diabetes, and high blood pressure?  If you are 39-65 years of age, have your blood pressure checked every 3-5 years. If you are 61 years of age or older, have your blood pressure checked every year. You should have your blood pressure measured twice-once when you are at a hospital or clinic,  and once when you are not at a hospital or clinic. Record the average of the two measurements. To check your blood pressure when you are not at a hospital or clinic, you can use: ? An automated blood pressure machine at a pharmacy. ? A home blood pressure monitor.  Talk to your health care provider about your target blood pressure.  If you are between 32-21 years old, ask your health care provider if you should take aspirin to prevent heart disease.  Have regular diabetes screenings by checking your fasting blood sugar level. ? If you are at a normal weight and have a low risk for diabetes, have this test once every three years after the age of 4. ? If you are overweight and have a high risk for diabetes, consider being tested at a younger age or more often.  A one-time screening for abdominal aortic aneurysm (AAA) by ultrasound is recommended for men aged 35-75 years who are current or former smokers. What should I know about preventing infection? Hepatitis B If you have a higher risk for hepatitis B, you should be screened for this virus. Talk with your health care provider to find out if you are at risk for hepatitis B infection. Hepatitis C Blood testing is recommended for:  Everyone born from [birth year] through 1967.  Anyone with known risk factors for hepatitis C. Sexually Transmitted Diseases (STDs)  You should be screened each year for STDs including gonorrhea and chlamydia if: ? You are sexually active and are younger than 75 years of age. ? You are older than 75 years of age and your health care provider tells you that you are at risk for this type of infection. ? Your sexual activity has changed since you were last screened and you are at an increased risk for chlamydia or gonorrhea. Ask your health care provider if you are at risk.  Talk with your health care provider about whether you are at high risk of being infected with HIV. Your health care provider may recommend a  prescription medicine to help prevent HIV infection. What else can I do?  Schedule regular health, dental, and eye exams.  Stay current with your vaccines (immunizations).  Do not use any tobacco products, such as cigarettes, chewing tobacco, and e-cigarettes. If you need help quitting, ask your health care provider.  Limit alcohol intake to no more than 2 drinks per day. One drink equals 12 ounces of beer, 5 ounces of wine, or 1 ounces of hard liquor.  Do not use street drugs.  Do not share needles.  Ask your health care provider for help if you need support or information about quitting drugs.  Tell your health care provider if you often feel depressed.  Tell your health care provider if you have ever been abused or do not feel safe at home. This information is not intended to replace advice given to you by your health care provider. Make sure you discuss any questions you have with your health care provider. Document Released: 03/03/2008 Document Revised: 05/04/2016 Document Reviewed: 06/09/2015 Elsevier Interactive Patient Education  2019 [employer].

## 2018-12-14 NOTE — Telephone Encounter (Signed)
During awv, pt. stated his BG has been lower than usual, dipping down to 40s at times. Pt. Is able to tell when he has hypoglycemic episodes and has candy or juice on hand. Pt. Stated he has not changed his diet or diabetes medication regimine, only just started taking zanafelx for muscle spasms. Diabetes care largely received through [place]. Pt. Stated he did not feel like he needed to come into office or see PCP virtually at this time, but did want to know if/when he can have labwork completed. Pt. is scheduled for 6 month follow-up with Dr. Jerilee Hoh 05/24/2019. Routed to PCP to advise.

## 2018-12-18 ENCOUNTER — Other Ambulatory Visit: Payer: Self-pay

## 2018-12-18 ENCOUNTER — Ambulatory Visit (INDEPENDENT_AMBULATORY_CARE_PROVIDER_SITE_OTHER): Payer: Medicare Other | Admitting: Internal Medicine

## 2018-12-18 DIAGNOSIS — E119 Type 2 diabetes mellitus without complications: Secondary | ICD-10-CM | POA: Diagnosis not present

## 2018-12-18 DIAGNOSIS — Z794 Long term (current) use of insulin: Secondary | ICD-10-CM | POA: Diagnosis not present

## 2018-12-18 DIAGNOSIS — IMO0001 Reserved for inherently not codable concepts without codable children: Secondary | ICD-10-CM

## 2018-12-18 DIAGNOSIS — E162 Hypoglycemia, unspecified: Secondary | ICD-10-CM

## 2018-12-18 NOTE — Progress Notes (Signed)
Virtual Visit via Video Note  I connected with Brent [name]. on 12/18/18 at  3:00 PM EDT by a video enabled telemedicine application and verified that I am speaking with the correct person using two identifiers.  Location patient: home Location provider: work office Persons participating in the virtual visit: patient, provider  I discussed the limitations of evaluation and management by telemedicine and the availability of in person appointments. The patient expressed understanding and agreed to proceed.   HPI: Visit is scheduled as a follow up to his AWV where he mentioned concerns for hypoglycemia. FBS had been around 40. No change in eating habits, he has been less physically active due to quarantine. He has been modifying his lantus dose. Supposed to be on 60 units and dropped down to 40, but CBGs were too high, so last night took 50 units and FBS this am was 89. No other complaints.   ROS: Constitutional: Denies fever, chills, diaphoresis, appetite change and fatigue.  HEENT: Denies photophobia, eye pain, redness, hearing loss, ear pain, congestion, sore throat, rhinorrhea, sneezing, mouth sores, trouble swallowing, neck pain, neck stiffness and tinnitus.   Respiratory: Denies SOB, DOE, cough, chest tightness,  and wheezing.   Cardiovascular: Denies chest pain, palpitations and leg swelling.  Gastrointestinal: Denies nausea, vomiting, abdominal pain, diarrhea, constipation, blood in stool and abdominal distention.  Genitourinary: Denies dysuria, urgency, frequency, hematuria, flank pain and difficulty urinating.  Endocrine: Denies: hot or cold intolerance, sweats, changes in hair or nails, polyuria, polydipsia. Musculoskeletal: Denies myalgias, back pain, joint swelling, arthralgias and gait problem.  Skin: Denies pallor, rash and wound.  Neurological: Denies dizziness, seizures, syncope, weakness, light-headedness, numbness and headaches.  Hematological: Denies adenopathy.  Easy bruising, personal or family bleeding history  Psychiatric/Behavioral: Denies suicidal ideation, mood changes, confusion, nervousness, sleep disturbance and agitation   Past Medical History:  Diagnosis Date   Allergy    CAD (coronary artery disease)    a. angioplasty of his RCA in 1990. b. bare metal stent placed in the RCA in 2000 followed by rotational atherectomy shortly after for stent restenosis. c. last cath was in 2012 showing stable moderate diffuse CAD. (70% mid LAD, 80% diagonal, 70% Ramus, 40% mid to distal RCA stent restenosis). d. Low risk nuc in 2015.   Diabetes mellitus    Diverticulosis    Erectile dysfunction    Hemorrhoids    HTN (hypertension)    Hyperlipidemia    Malignant melanoma of left side of neck ([place]) 10/25/2018   Myocardial infarction (HCC)    Obesity    OSA (obstructive sleep apnea)    Persistent disorder of initiating or maintaining sleep    Personal history of colonic polyps 02/05/2003    Past Surgical History:  Procedure Laterality Date   CHOLECYSTECTOMY     CORONARY ANGIOPLASTY     CORONARY STENT PLACEMENT     stenting of the right coronary artery with followup rotational  atherectomy.    FINGER SURGERY     right   FOOT SURGERY     right   INGUINAL HERNIA REPAIR     right   melanoma removal     ORTHOPEDIC SURGERY     foot right   rotator cuff surg     Bil    Family History  Problem Relation Age of Onset   Liver cancer Father    Lung cancer Father    Heart disease Father    Heart disease Sister    Lung  cancer Mother    COPD Mother    Obesity Mother    Colon cancer Neg Hx     SOCIAL HX:   reports that he quit smoking about 38 years ago. His smoking use included cigarettes. He has a 45.00 pack-year smoking history. He has never used smokeless tobacco. He reports current alcohol use of about 7.0 - 14.0 standard drinks of alcohol per week. He reports that he does not use drugs.   Current Outpatient  Medications:    aspirin 81 MG tablet, Take 81 mg by mouth daily., Disp: , Rfl:    Diclofenac Sodium (PENNSAID) 2 % SOLN, Apply 2 pumps to affected area 2 times daily., Disp: 1 Bottle, Rfl: 0   empagliflozin (JARDIANCE) 25 MG TABS tablet, Take 25 mg by mouth daily., Disp: , Rfl:    fenofibrate 160 MG tablet, Take 160 mg by mouth daily., Disp: , Rfl:    fluticasone (FLONASE) 50 MCG/ACT nasal spray, Place 2 sprays into both nostrils daily., Disp: 48 g, Rfl: 3   Icosapent Ethyl (VASCEPA) 1 g CAPS, Take 2 capsules (2 g total) by mouth 2 (two) times daily., Disp: 120 capsule, Rfl:    insulin aspart (NOVOLOG FLEXPEN) 100 UNIT/ML FlexPen, Inject 40 Units 3 (three) times daily with meals into the skin., Disp: , Rfl:    insulin glargine (LANTUS) 100 unit/mL SOPN, Inject 50 Units into the skin at bedtime., Disp: , Rfl:    lidocaine (XYLOCAINE) 2 % solution, Use as directed 15 mLs in the mouth or throat as needed for mouth pain., Disp: 150 mL, Rfl: 0   lisinopril (PRINIVIL,ZESTRIL) 5 MG tablet, Take 5 mg by mouth daily., Disp: , Rfl:    LORazepam (ATIVAN) 1 MG tablet, TAKE 1 TABLET TWICE A DAY AS NEEDED FOR ANXIETY, Disp: 180 tablet, Rfl: 1   metFORMIN (GLUCOPHAGE) 1000 MG tablet, Take 1,000 mg by mouth 2 (two) times daily with a meal., Disp: , Rfl:    Multiple Vitamins-Minerals (CENTRUM SILVER PO), 1 tab po qd , Disp: , Rfl:    ondansetron (ZOFRAN) 4 MG tablet, Take 1 tablet (4 mg total) by mouth every 8 (eight) hours as needed for nausea or vomiting. (Patient not taking: Reported on 12/14/2018), Disp: 30 tablet, Rfl: 0   oxybutynin (DITROPAN) 5 MG tablet, Take 1 tablet by mouth daily., Disp: , Rfl:    rosuvastatin (CRESTOR) 40 MG tablet, Take 1 tablet (40 mg total) by mouth daily., Disp: , Rfl:    sertraline (ZOLOFT) 100 MG tablet, Take 150 mg by mouth daily. Total of 150mg  daily, Disp: , Rfl:    tiZANidine (ZANAFLEX) 4 MG tablet, Take 0.5-1 tablets (2-4 mg total) by mouth at bedtime., Disp:  60 tablet, Rfl: 0   vitamin B-12 (CYANOCOBALAMIN) 1000 MCG tablet, Take 1,000 mcg by mouth daily.  , Disp: , Rfl:    zolpidem (AMBIEN) 10 MG tablet, Take 10 mg by mouth at bedtime as needed. For insomnia, Disp: , Rfl:   EXAM:   VITALS per patient if applicable: none reported  GENERAL: alert, oriented, appears well and in no acute distress  HEENT: atraumatic, conjunttiva clear, no obvious abnormalities on inspection of external nose and ears, wears corrective lenses  NECK: normal movements of the head and neck  LUNGS: on inspection no signs of respiratory distress, breathing rate appears normal, no obvious gross increased work of breathing, gasping or wheezing  CV: no obvious cyanosis  MS: moves all visible extremities without noticeable abnormality  PSYCH/NEURO: pleasant  and cooperative, no obvious depression or anxiety, speech and thought processing grossly intact  ASSESSMENT AND PLAN:   Hypoglycemia IDDM (insulin dependent diabetes mellitus) ([place]) -He was done well, self titrating his lantus dosing at home. -Continue 50 units for now. -He will keep a CBG log and notify us with extreme changes.     I discussed the assessment and treatment plan with the patient. The patient was provided an opportunity to ask questions and all were answered. The patient agreed with the plan and demonstrated an understanding of the instructions.   The patient was advised to call back or seek an in-person evaluation if the symptoms worsen or if the condition fails to improve as anticipated.    Lelon Frohlich, MD  [facility]

## 2018-12-18 NOTE — Telephone Encounter (Signed)
WebEx appointment scheduled

## 2018-12-18 NOTE — Telephone Encounter (Signed)
Apolonio Schneiders, you mind giving him a call? If he has routine hypoglycemia I definitely want to see him.

## 2019-01-15 ENCOUNTER — Ambulatory Visit (INDEPENDENT_AMBULATORY_CARE_PROVIDER_SITE_OTHER): Payer: Medicare Other | Admitting: Gastroenterology

## 2019-01-15 ENCOUNTER — Other Ambulatory Visit: Payer: Self-pay

## 2019-01-15 ENCOUNTER — Encounter: Payer: Self-pay | Admitting: Gastroenterology

## 2019-01-15 ENCOUNTER — Ambulatory Visit: Payer: Medicare Other | Admitting: Gastroenterology

## 2019-01-15 VITALS — Ht 72.0 in | Wt 219.0 lb

## 2019-01-15 DIAGNOSIS — K625 Hemorrhage of anus and rectum: Secondary | ICD-10-CM | POA: Diagnosis not present

## 2019-01-15 NOTE — Progress Notes (Signed)
This patient contacted our office requesting a physician telemedicine video consultation regarding clinical questions and/or test results.  Participants on the conference : myself and patient   The patient consented to phone consultation and was aware that a charge will be placed through their insurance.  I was in my office and the patient was at home   Encounter time:  Total time 18 minutes, with 15 minutes spent with patient on phone/webex    Wilfrid Lund, MD   _____________________________________________________________________________________________               [name] Note  Chief Complaint: Rectal bleeding  Subjective  History:  Last seen by me May 2018 for surveillance colonoscopy.  2 diminutive tubular adenomas were removed, recommendation then for no future surveillance due to age, based on current guidelines.   Last few weeks getting bleeding with BMs, now also occurring with blood in toilet bowel.  No abdominal pain, bowel habits regular without straining. Appetite good and weight stable. ROS: Cardiovascular:  no chest pain Respiratory: no dyspnea  The patient's Past Medical, Family and Social History were reviewed and are on file in the EMR.  Objective:  Med list reviewed  Current Outpatient Medications:  .  aspirin 81 MG tablet, Take 81 mg by mouth daily., Disp: , Rfl:  .  Diclofenac Sodium (PENNSAID) 2 % SOLN, Apply 2 pumps to affected area 2 times daily., Disp: 1 Bottle, Rfl: 0 .  empagliflozin (JARDIANCE) 25 MG TABS tablet, Take 25 mg by mouth daily., Disp: , Rfl:  .  fenofibrate 160 MG tablet, Take 160 mg by mouth daily., Disp: , Rfl:  .  fluticasone (FLONASE) 50 MCG/ACT nasal spray, Place 2 sprays into both nostrils daily., Disp: 48 g, Rfl: 3 .  insulin aspart (NOVOLOG FLEXPEN) 100 UNIT/ML FlexPen, Inject 40 Units 3 (three) times daily with meals into the skin., Disp: , Rfl:  .  insulin glargine (LANTUS) 100 unit/mL SOPN, Inject  50 Units into the skin at bedtime., Disp: , Rfl:  .  lisinopril (PRINIVIL,ZESTRIL) 5 MG tablet, Take 5 mg by mouth daily., Disp: , Rfl:  .  LORazepam (ATIVAN) 1 MG tablet, TAKE 1 TABLET TWICE A DAY AS NEEDED FOR ANXIETY, Disp: 180 tablet, Rfl: 1 .  metFORMIN (GLUCOPHAGE) 1000 MG tablet, Take 1,000 mg by mouth 2 (two) times daily with a meal., Disp: , Rfl:  .  Multiple Vitamins-Minerals (CENTRUM SILVER PO), 1 tab po qd , Disp: , Rfl:  .  ondansetron (ZOFRAN) 4 MG tablet, Take 1 tablet (4 mg total) by mouth every 8 (eight) hours as needed for nausea or vomiting. (Patient not taking: Reported on 12/14/2018), Disp: 30 tablet, Rfl: 0 .  oxybutynin (DITROPAN) 5 MG tablet, Take 1 tablet by mouth daily., Disp: , Rfl:  .  rosuvastatin (CRESTOR) 40 MG tablet, Take 1 tablet (40 mg total) by mouth daily., Disp: , Rfl:  .  sertraline (ZOLOFT) 100 MG tablet, Take 150 mg by mouth daily. Total of 150mg  daily, Disp: , Rfl:  .  tiZANidine (ZANAFLEX) 4 MG tablet, Take 0.5-1 tablets (2-4 mg total) by mouth at bedtime., Disp: 60 tablet, Rfl: 0 .  vitamin B-12 (CYANOCOBALAMIN) 1000 MCG tablet, Take 1,000 mcg by mouth daily.  , Disp: , Rfl:  .  zolpidem (AMBIEN) 10 MG tablet, Take 10 mg by mouth at bedtime as needed. For insomnia, Disp: , Rfl:     No exam - virtual visit    @ASSESSMENTPLANBEGIN @ Assessment: Encounter Diagnosis  Name Primary?  . Rectal bleeding Yes   Sounds most likely to be internal hemorrhoids.   Plan: Recommended OTC prep H suppository 1-2 times daily.  My office will contact him to schedule an in person clinic visit in the next couple of weeks so I can examine him and determine cause of bleeding and best treatment.   Nelida Meuse III

## 2019-01-30 ENCOUNTER — Telehealth: Payer: Self-pay | Admitting: General Surgery

## 2019-01-30 NOTE — Telephone Encounter (Signed)
Covid-19 travel screening questions  Have you traveled in the last 14 days? no If yes where?  Do you now or have you had a fever in the last 14 days? no  Do you have any respiratory symptoms of shortness of breath or cough now or in the last 14 days? no  Do you have a medical history of Congestive Heart Failure? no  Do you have a medical history of lung disease? no  Do you have any family members or close contacts with diagnosed or suspected Covid-19? no       

## 2019-01-31 ENCOUNTER — Encounter: Payer: Self-pay | Admitting: Gastroenterology

## 2019-01-31 ENCOUNTER — Ambulatory Visit (INDEPENDENT_AMBULATORY_CARE_PROVIDER_SITE_OTHER): Payer: Medicare Other | Admitting: Gastroenterology

## 2019-01-31 ENCOUNTER — Other Ambulatory Visit: Payer: Self-pay

## 2019-01-31 VITALS — BP 122/72 | HR 65 | Temp 97.7°F | Ht 72.0 in | Wt 218.0 lb

## 2019-01-31 DIAGNOSIS — K648 Other hemorrhoids: Secondary | ICD-10-CM | POA: Diagnosis not present

## 2019-01-31 DIAGNOSIS — K5909 Other constipation: Secondary | ICD-10-CM

## 2019-01-31 NOTE — Progress Notes (Signed)
[facility] Progress Note  Chief Complaint: Rectal bleeding  Subjective  History: Last colonoscopy May 2018.  Recent painless rectal bleeding, see telemedicine note.  Recommended Preparation H suppository and office evaluation.  The suppositories have not been helpful, he continues to have bleeding several days a week, sometimes with straining, but other times without.  Occasionally stool is loose, most often is formed.  He denies abdominal pain.  ROS: Cardiovascular:  no chest pain Respiratory: no dyspnea  The patient's Past Medical, Family and Social History were reviewed and are on file in the EMR.  Objective:  Med list reviewed  Current Outpatient Medications:  .  aspirin 81 MG tablet, Take 81 mg by mouth daily., Disp: , Rfl:  .  Diclofenac Sodium (PENNSAID) 2 % SOLN, Apply 2 pumps to affected area 2 times daily., Disp: 1 Bottle, Rfl: 0 .  empagliflozin (JARDIANCE) 25 MG TABS tablet, Take 25 mg by mouth daily., Disp: , Rfl:  .  fenofibrate 160 MG tablet, Take 160 mg by mouth daily., Disp: , Rfl:  .  fluticasone (FLONASE) 50 MCG/ACT nasal spray, Place 2 sprays into both nostrils daily., Disp: 48 g, Rfl: 3 .  insulin aspart (NOVOLOG FLEXPEN) 100 UNIT/ML FlexPen, Inject 40 Units 3 (three) times daily with meals into the skin., Disp: , Rfl:  .  insulin glargine (LANTUS) 100 unit/mL SOPN, Inject 50 Units into the skin at bedtime., Disp: , Rfl:  .  lisinopril (PRINIVIL,ZESTRIL) 5 MG tablet, Take 5 mg by mouth daily., Disp: , Rfl:  .  LORazepam (ATIVAN) 1 MG tablet, TAKE 1 TABLET TWICE A DAY AS NEEDED FOR ANXIETY, Disp: 180 tablet, Rfl: 1 .  metFORMIN (GLUCOPHAGE) 1000 MG tablet, Take 1,000 mg by mouth 2 (two) times daily with a meal., Disp: , Rfl:  .  Multiple Vitamins-Minerals (CENTRUM SILVER PO), 1 tab po qd , Disp: , Rfl:  .  oxybutynin (DITROPAN) 5 MG tablet, Take 1 tablet by mouth daily., Disp: , Rfl:  .  rosuvastatin (CRESTOR) 40 MG tablet, Take 1 tablet (40 mg total)  by mouth daily., Disp: , Rfl:  .  sertraline (ZOLOFT) 100 MG tablet, Take 150 mg by mouth daily. Total of 150mg  daily, Disp: , Rfl:  .  tiZANidine (ZANAFLEX) 4 MG tablet, Take 0.5-1 tablets (2-4 mg total) by mouth at bedtime., Disp: 60 tablet, Rfl: 0 .  vitamin B-12 (CYANOCOBALAMIN) 1000 MCG tablet, Take 1,000 mcg by mouth daily.  , Disp: , Rfl:  .  zolpidem (AMBIEN) 10 MG tablet, Take 10 mg by mouth at bedtime as needed. For insomnia, Disp: , Rfl:    Vital signs in last 24 hrs: Vitals:   01/31/19 0849  BP: 122/72  Pulse: 65  Temp: 97.7 F (36.5 C)    Physical Exam  He is well-appearing    Neck: supple, no thyromegaly, JVD or lymphadenopathy  Cardiac: RRR without murmurs, S1S2 heard, no peripheral edema  Pulm: clear to auscultation bilaterally, normal RR and effort noted  Abdomen: soft, no tenderness, with active bowel sounds. No guarding or palpable hepatosplenomegaly.  Skin; warm and dry, no jaundice or rash Rectal: Normal sphincter tone, no fissure, no palpable internal lesions, soft brown stool in rectal vault   Anoscopy: Internal hemorrhoids, largest appears to be RP column    @ASSESSMENTPLANBEGIN @ Assessment: Encounter Diagnoses  Name Primary?  . Bleeding internal hemorrhoids Yes  . Chronic constipation    We discussed the nature of hemorrhoids and bleeding, I showed him a  diagram of the anatomy and discuss treatment options.  He was offered hemorrhoidal banding, with procedure described in detail along with risks and benefits.  He wished to proceed and consent was obtained.   PROCEDURE NOTE: The patient presents with symptomatic grade 2 hemorrhoids, requesting rubber band ligation of his/her hemorrhoidal disease. All risks, benefits and alternative forms of therapy were described and informed consent was obtained.  The anorectum was pre-medicated with 0.125% NTG and lubricant. The decision was made to band the RP internal hemorrhoids, and the [facility] was used to perform band ligation without complication. Digital anorectal examination was then performed to assure proper positioning of the band, and to adjust the banded tissue as required. The patient was discharged home without pain or other issues. Dietary and behavioral recommendations were given and along with follow-up instructions.   The following adjunctive treatments were recommended:  Daily stool softener, and avoid straining and sitting in the toilet for prolonged periods.  The patient will call in 2 weeks with update, and possible additional banding can be scheduled as required. No complications were encountered and the patient tolerated the procedure well.  [name]

## 2019-01-31 NOTE — Patient Instructions (Addendum)
If you are age 75 or older, your body mass index should be between 23-30. Your Body mass index is 29.57 kg/m. If this is out of the aforementioned range listed, please consider follow up with your Primary Care Provider.  If you are age 44 or younger, your body mass index should be between 19-25. Your Body mass index is 29.57 kg/m. If this is out of the aformentioned range listed, please consider follow up with your Primary Care Provider.   Please use  Ducosate 100 mg capsule daily.  Please call in 2 weeks with an update. Vaughan Basta RN [phone]  HEMORRHOID BANDING PROCEDURE    FOLLOW-UP CARE   1. The procedure you have had should have been relatively painless since the banding of the area involved does not have nerve endings and there is no pain sensation.  The rubber band cuts off the blood supply to the hemorrhoid and the band may fall off as soon as 48 hours after the banding (the band may occasionally be seen in the toilet bowl following a bowel movement). You may notice a temporary feeling of fullness in the rectum which should respond adequately to plain Tylenol or Motrin.  2. Following the banding, avoid strenuous exercise that evening and resume full activity the next day.  A sitz bath (soaking in a warm tub) or bidet is soothing, and can be useful for cleansing the area after bowel movements.     3. To avoid constipation, take two tablespoons of natural wheat bran, natural oat bran, flax, Benefiber or any over the counter fiber supplement and increase your water intake to 7-8 glasses daily.    4. Unless you have been prescribed anorectal medication, do not put anything inside your rectum for two weeks: No suppositories, enemas, fingers, etc.  5. Occasionally, you may have more bleeding than usual after the banding procedure.  This is often from the untreated hemorrhoids rather than the treated one.  Don't be concerned if there is a tablespoon or so of blood.  If there is more blood  than this, lie flat with your bottom higher than your head and apply an ice pack to the area. If the bleeding does not stop within a half an hour or if you feel faint, call our office at [phone] or go to the emergency room.  6. Problems are not common; however, if there is a substantial amount of bleeding, severe pain, chills, fever or difficulty passing urine (very rare) or other problems, you should call us at [phone] or report to the nearest emergency room.  7. Do not stay seated continuously for more than 2-3 hours for a day or two after the procedure.  Tighten your buttock muscles 10-15 times every two hours and take 10-15 deep breaths every 1-2 hours.  Do not spend more than a few minutes on the toilet if you cannot empty your bowel; instead re-visit the toilet at a later time.      It was a pleasure to see you today!  Dr. Loletha Carrow

## 2019-02-05 ENCOUNTER — Other Ambulatory Visit (INDEPENDENT_AMBULATORY_CARE_PROVIDER_SITE_OTHER): Payer: Self-pay | Admitting: Physical Medicine and Rehabilitation

## 2019-02-05 NOTE — Telephone Encounter (Signed)
Please advise 

## 2019-03-02 ENCOUNTER — Other Ambulatory Visit (INDEPENDENT_AMBULATORY_CARE_PROVIDER_SITE_OTHER): Payer: Self-pay | Admitting: Physical Medicine and Rehabilitation

## 2019-03-04 ENCOUNTER — Encounter: Payer: Self-pay | Admitting: Internal Medicine

## 2019-03-04 DIAGNOSIS — R52 Pain, unspecified: Secondary | ICD-10-CM | POA: Diagnosis not present

## 2019-03-04 DIAGNOSIS — R079 Chest pain, unspecified: Secondary | ICD-10-CM | POA: Diagnosis not present

## 2019-03-04 DIAGNOSIS — Z955 Presence of coronary angioplasty implant and graft: Secondary | ICD-10-CM | POA: Diagnosis not present

## 2019-03-04 DIAGNOSIS — R110 Nausea: Secondary | ICD-10-CM | POA: Diagnosis not present

## 2019-03-04 DIAGNOSIS — E785 Hyperlipidemia, unspecified: Secondary | ICD-10-CM | POA: Diagnosis not present

## 2019-03-04 DIAGNOSIS — I429 Cardiomyopathy, unspecified: Secondary | ICD-10-CM | POA: Diagnosis not present

## 2019-03-04 DIAGNOSIS — R109 Unspecified abdominal pain: Secondary | ICD-10-CM | POA: Diagnosis not present

## 2019-03-04 DIAGNOSIS — Z79899 Other long term (current) drug therapy: Secondary | ICD-10-CM | POA: Diagnosis not present

## 2019-03-11 NOTE — Telephone Encounter (Signed)
Please advise 

## 2019-03-13 ENCOUNTER — Encounter: Payer: Self-pay | Admitting: Physical Medicine and Rehabilitation

## 2019-03-13 ENCOUNTER — Other Ambulatory Visit: Payer: Self-pay

## 2019-03-13 ENCOUNTER — Ambulatory Visit (INDEPENDENT_AMBULATORY_CARE_PROVIDER_SITE_OTHER): Payer: Medicare Other | Admitting: Physical Medicine and Rehabilitation

## 2019-03-13 VITALS — BP 148/68 | HR 74

## 2019-03-13 DIAGNOSIS — I2510 Atherosclerotic heart disease of native coronary artery without angina pectoris: Secondary | ICD-10-CM | POA: Diagnosis not present

## 2019-03-13 DIAGNOSIS — M542 Cervicalgia: Secondary | ICD-10-CM | POA: Diagnosis not present

## 2019-03-13 NOTE — Progress Notes (Signed)
  Numeric Pain Rating Scale and Functional Assessment Average Pain 7   In the last MONTH (on 0-10 scale) has pain interfered with the following?  1. General activity like being  able to carry out your everyday physical activities such as walking, climbing stairs, carrying groceries, or moving a chair?  Rating(5)

## 2019-03-28 DIAGNOSIS — H52203 Unspecified astigmatism, bilateral: Secondary | ICD-10-CM | POA: Diagnosis not present

## 2019-03-28 DIAGNOSIS — H2513 Age-related nuclear cataract, bilateral: Secondary | ICD-10-CM | POA: Diagnosis not present

## 2019-03-28 DIAGNOSIS — E119 Type 2 diabetes mellitus without complications: Secondary | ICD-10-CM | POA: Diagnosis not present

## 2019-03-28 DIAGNOSIS — H524 Presbyopia: Secondary | ICD-10-CM | POA: Diagnosis not present

## 2019-03-28 LAB — HM DIABETES EYE EXAM

## 2019-04-02 ENCOUNTER — Telehealth: Payer: Self-pay

## 2019-04-02 DIAGNOSIS — C439 Malignant melanoma of skin, unspecified: Secondary | ICD-10-CM | POA: Diagnosis not present

## 2019-04-02 NOTE — Telephone Encounter (Signed)
Covid-19 screening questions  Do you now or have you had a fever in the last 14 days?  Do you have any respiratory symptoms of shortness of breath or cough now or in the last 14 days?  Do you have any family members or close contacts with diagnosed or suspected Covid-19 in the past 14 days?  Have you been tested for Covid-19 and found to be positive?   Pt answered NO to all Covid screening questions above.  He understands to wear a mask to his appt with Dr. Loletha Carrow tomorrow on 7-15.

## 2019-04-03 ENCOUNTER — Encounter: Payer: Self-pay | Admitting: Gastroenterology

## 2019-04-03 ENCOUNTER — Other Ambulatory Visit: Payer: Self-pay

## 2019-04-03 ENCOUNTER — Ambulatory Visit (INDEPENDENT_AMBULATORY_CARE_PROVIDER_SITE_OTHER): Payer: Medicare Other | Admitting: Gastroenterology

## 2019-04-03 VITALS — BP 142/64 | HR 86 | Temp 97.6°F | Ht 72.0 in | Wt 216.2 lb

## 2019-04-03 DIAGNOSIS — K648 Other hemorrhoids: Secondary | ICD-10-CM

## 2019-04-03 NOTE — Patient Instructions (Signed)
If you are age 75 or older, your body mass index should be between 23-30. Your Body mass index is 29.33 kg/m. If this is out of the aforementioned range listed, please consider follow up with your Primary Care Provider.  If you are age 37 or younger, your body mass index should be between 19-25. Your Body mass index is 29.33 kg/m. If this is out of the aformentioned range listed, please consider follow up with your Primary Care Provider.   HEMORRHOID BANDING PROCEDURE    FOLLOW-UP CARE   1. The procedure you have had should have been relatively painless since the banding of the area involved does not have nerve endings and there is no pain sensation.  The rubber band cuts off the blood supply to the hemorrhoid and the band may fall off as soon as 48 hours after the banding (the band may occasionally be seen in the toilet bowl following a bowel movement). You may notice a temporary feeling of fullness in the rectum which should respond adequately to plain Tylenol or Motrin.  2. Following the banding, avoid strenuous exercise that evening and resume full activity the next day.  A sitz bath (soaking in a warm tub) or bidet is soothing, and can be useful for cleansing the area after bowel movements.     3. To avoid constipation, take two tablespoons of natural wheat bran, natural oat bran, flax, Benefiber or any over the counter fiber supplement and increase your water intake to 7-8 glasses daily.    4. Unless you have been prescribed anorectal medication, do not put anything inside your rectum for two weeks: No suppositories, enemas, fingers, etc.  5. Occasionally, you may have more bleeding than usual after the banding procedure.  This is often from the untreated hemorrhoids rather than the treated one.  Don't be concerned if there is a tablespoon or so of blood.  If there is more blood than this, lie flat with your bottom higher than your head and apply an ice pack to the area. If the bleeding  does not stop within a half an hour or if you feel faint, call our office at [phone] or go to the emergency room.  6. Problems are not common; however, if there is a substantial amount of bleeding, severe pain, chills, fever or difficulty passing urine (very rare) or other problems, you should call us at [phone] or report to the nearest emergency room.  7. Do not stay seated continuously for more than 2-3 hours for a day or two after the procedure.  Tighten your buttock muscles 10-15 times every two hours and take 10-15 deep breaths every 1-2 hours.  Do not spend more than a few minutes on the toilet if you cannot empty your bowel; instead re-visit the toilet at a later time.

## 2019-04-03 NOTE — Progress Notes (Signed)
PROCEDURE NOTE: The patient presents with symptomatic grade 2 hemorrhoids, requesting rubber band ligation of his/her hemorrhoidal disease. All risks, benefits and alternative forms of therapy were described and informed consent was obtained.  DRE revealed: normal   The anorectum was pre-medicated with 0.125% NTG and lubricant. The decision was made to band the RA internal hemorrhoids, and the [facility] System was used to perform band ligation without complication. Digital anorectal examination was then performed to assure proper positioning of the band, and to adjust the banded tissue as required. The patient was discharged home without pain or other issues. Dietary and behavioral recommendations were given and along with follow-up instructions.   The following adjunctive treatments were recommended:  none  The patient will return 3-4 weeks  for follow-up and possible additional banding as required. No complications were encountered and the patient tolerated the procedure well.  

## 2019-04-04 ENCOUNTER — Encounter: Payer: Self-pay | Admitting: Internal Medicine

## 2019-04-07 ENCOUNTER — Encounter: Payer: Self-pay | Admitting: Physician Assistant

## 2019-04-07 NOTE — Progress Notes (Signed)
Cardiology Office Note    Date:  04/10/2019  ID:  [name], DOB Jun 19, 1946, MRN [id number] PCP:  [name], [name], MD  Cardiologist:  Lauree Chandler, MD   Chief Complaint: 12 month f/u of CAD + risk factors  History of Present Illness:  Brent Parenteau. is a 73 y.o. male with history of CAD s/p PCI 1990 & 2000, HTN, HLD, DM, OSA followed by pulmonology, obesity, elevated CK level who presents for 12 month f/u.   He was followed in the past by Dr. Lia Foyer, and more recently by Dr. Angelena Form. He had angioplasty of his RCA in 1990. He had a bare metal stent placed in the RCA in 2000 followed by rotational atherectomy shortly after for stent restenosis. His last cath was in 2012 showing stable moderate diffuse CAD. (70% mid LAD, 80% diagonal, 70% Ramus, 40% mid to distal RCA stent restenosis). He has done well with medical management. Stress myoview January 2015 showed hypertensive response to exercise, 90mm ST segment depression in inferior and lateral leads with stress, but no ischemia and felt to be low risk. Per pharmD note, "Pt previously participated in the ACCELERATE trial (CTEP inhibitor) and was noted to have elevated CK. He stopped the study drug mid 2015 due to weakness. His first elevated CK was sometime around the end of September and he stopped Lipitor at that time. Unfortunately those labs are not available. His CK in October off Lipitor was still elevated at 425. A repeat 6 weeks later showed that it had normalized at 198.  He was previously tried on Livalo which also caused elevated CK to 417." This was changed to Crestor, and he was seen in lipid clinic 03/2018 with total CK 221. He has not had f/u since that time. There was a plan to start Vascepa but does not appear this ever occurred on the patient's end. Last labs otherwise in 03/2018 showed LFTs wnl, trig 443, 08/2017 Hgb 14.1, plt 453, TSH and pBNP wnl. It was noted in lipid clinic pt's diet  frequently consisted of bacon/eggs/sausage in AM and 1-2 whiskey/Diet cokes nightly.  He returns for follow-up doing well without any CP or SOB. He denies any palpitations, syncope, coughing, orthopnea, edema. His BP is running slightly elevated today and has been so for several visits in Epic. He does notice he tends to bruise easily. He is being followed by GI for hemorrhoids with spotty BRBPR but denies any melena or frank bleeding. We discussed reduction in ETOH and salt in diet. He takes care of grandson (7 y/o).   Past Medical History:  Diagnosis Date  . Allergy   . CAD (coronary artery disease)    a. angioplasty of his RCA in 1990. b. bare metal stent placed in the RCA in 2000 followed by rotational atherectomy shortly after for stent restenosis. c. last cath was in 2012 showing stable moderate diffuse CAD. (70% mid LAD, 80% diagonal, 70% Ramus, 40% mid to distal RCA stent restenosis). d. Low risk nuc in 2015.  . Diabetes mellitus   . Diverticulosis   . Elevated CK   . Erectile dysfunction   . Hemorrhoids   . HTN (hypertension)   . Hyperlipidemia   . Hypertriglyceridemia   . Malignant melanoma of left side of neck ([place]) 10/25/2018  . Myocardial infarction ([place])   . Obesity   . OSA (obstructive sleep apnea)   . Persistent disorder of initiating or maintaining sleep   . Personal history  of colonic polyps 02/05/2003    Past Surgical History:  Procedure Laterality Date  . CHOLECYSTECTOMY    . CORONARY ANGIOPLASTY    . CORONARY STENT PLACEMENT     stenting of the right coronary artery with followup rotational  atherectomy.   Marland Kitchen FINGER SURGERY     right  . FOOT SURGERY     right  . INGUINAL HERNIA REPAIR     right  . melanoma removal    . NECK SURGERY     melanoma  . ORTHOPEDIC SURGERY     foot right  . rotator cuff surg     Bil    Current Medications: Current Meds  Medication Sig  . aspirin 81 MG tablet Take 81 mg by mouth daily.  . empagliflozin (JARDIANCE) 25 MG  TABS tablet Take 25 mg by mouth daily.  . fenofibrate 160 MG tablet Take 160 mg by mouth daily.  . fluticasone (FLONASE) 50 MCG/ACT nasal spray Place 2 sprays into both nostrils daily.  . insulin aspart (NOVOLOG FLEXPEN) 100 UNIT/ML FlexPen Inject 40 Units 3 (three) times daily with meals into the skin.  Marland Kitchen insulin glargine (LANTUS) 100 unit/mL SOPN Inject 50 Units into the skin at bedtime.  Marland Kitchen LORazepam (ATIVAN) 1 MG tablet TAKE 1 TABLET TWICE A DAY AS NEEDED FOR ANXIETY  . metFORMIN (GLUCOPHAGE) 1000 MG tablet Take 1,000 mg by mouth 2 (two) times daily with a meal.  . Multiple Vitamins-Minerals (CENTRUM SILVER PO) 1 tab po qd   . oxybutynin (DITROPAN) 5 MG tablet Take 1 tablet by mouth daily.  . rosuvastatin (CRESTOR) 40 MG tablet Take 1 tablet (40 mg total) by mouth daily.  . sertraline (ZOLOFT) 100 MG tablet Take 150 mg by mouth daily. Total of 150mg  daily  . vitamin B-12 (CYANOCOBALAMIN) 1000 MCG tablet Take 1,000 mcg by mouth daily.    Marland Kitchen zolpidem (AMBIEN) 10 MG tablet Take 10 mg by mouth at bedtime as needed. For insomnia  . [DISCONTINUED] lisinopril (PRINIVIL,ZESTRIL) 5 MG tablet Take 5 mg by mouth daily.     Allergies:   Penicillins   Social History   Socioeconomic History  . Marital status: Married    Spouse name: Not on file  . Number of children: 1  . Years of education: Not on file  . Highest education level: Not on file  Occupational History  . Occupation: Teacher, English as a foreign language    Comment: [employer]  . Financial resource strain: Not hard at all  . Food insecurity    Worry: Never true    Inability: Never true  . Transportation needs    Medical: No    Non-medical: No  Tobacco Use  . Smoking status: Former Smoker    Packs/day: 1.50    Years: 30.00    Pack years: 45.00    Types: Cigarettes    Quit date: 09/19/1980    Years since quitting: 38.5  . Smokeless tobacco: Never Used  Substance and Sexual Activity  . Alcohol use: Yes    Alcohol/week: 7.0 - 14.0  standard drinks    Types: 7 - 14 Shots of liquor per week    Comment: occ.   . Drug use: No  . Sexual activity: Not on file  Lifestyle  . Physical activity    Days per week: 0 days    Minutes per session: 0 min  . Stress: Not at all  Relationships  . Social connections    Talks on phone: Once a week  Gets together: Never    Attends religious service: Not on file    Active member of club or organization: Not on file    Attends meetings of clubs or organizations: Not on file    Relationship status: Married  Other Topics Concern  . Not on file  Social History Narrative   12/14/2018: Lives with wife and 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 (son had passed away several years ago)   Recevies much of his care through [place]   Enjoys golfing     Family History:  The patient's family history includes COPD in his mother; Heart disease in his father and sister; Liver cancer in his father; Lung cancer in his father and mother; Obesity in his mother. There is no history of Colon cancer, Stomach cancer, or Pancreatic cancer.  ROS:   Please see the history of present illness.  All other systems are reviewed and otherwise negative.    PHYSICAL EXAM:   VS:  BP 140/64   Pulse 69   Ht 6' (1.829 m)   Wt 219 lb (99.3 kg)   SpO2 96%   BMI 29.70 kg/m   BMI: Body mass index is 29.7 kg/m. GEN: Well nourished, well developed WM in no acute distress HEENT: normocephalic, atraumatic Neck: no JVD, carotid bruits, or masses Cardiac:  RRR; no murmurs, rubs, or gallops, no edema  Respiratory:  clear to auscultation bilaterally, normal work of breathing GI: soft, nontender, nondistended, + BS MS: no deformity or atrophy Skin: warm and dry, no rash, very small rare senile purpura Neuro:  Alert and Oriented x 3, Strength and sensation are intact, follows commands Psych: euthymic mood, full affect  Wt Readings from Last 3 Encounters:  04/10/19 219 lb (99.3 kg)  04/03/19 216 lb 4 oz (98.1 kg)  01/31/19 218 lb  (98.9 kg)      Studies/Labs Reviewed:   EKG:  EKG was ordered today and personally reviewed by me and demonstrates NSR 69bpm, nonspecific STT changes, non acute. No acute change from prior.  Recent Labs: No results found for requested labs within last 8760 hours.   Lipid Panel    Component Value Date/Time   CHOL 147 03/26/2018 1039   TRIG 443 (H) 03/26/2018 1039   HDL 19 (L) 03/26/2018 1039   CHOLHDL 7.7 (H) 03/26/2018 1039   CHOLHDL 6 04/15/2016 1133   VLDL 54.8 (H) 03/18/2015 0833   LDLCALC Comment 03/26/2018 1039   LDLDIRECT 77.0 04/15/2016 1133    Additional studies/ records that were reviewed today include: Summarized above   ASSESSMENT & PLAN:   1. CAD - doing well without angina. Continue ASA, statin. Update labs when he returns fasting - CMET, CBC, lipid profile, direct LDL. Discussed important lifestyle choices including reduction in ETOH and lower sodium diet. Discussed warning sx of more significant bleeding. 2. Essential HTN - BP remains slightly above goal. Repeat confirmed by me, was similar to intake. Increase lisinopril to 10mg  to start. He was instructed to check his blood pressure daily and call/MyChart on Monday with those readings. Update labs when he returns fasting. 3. Hyperlipidemia - he has eaten breakfast today. Will need to return for labs fasting.  4. Elevated CK level - will assess on f/u labwork and involve lipid clinic if this remains elevated.  Disposition: F/u with Dr. Angelena Form in 1 year.  Medication Adjustments/Labs and Tests Ordered: Current medicines are reviewed at length with the patient today.  Concerns regarding medicines are outlined above. Medication changes, Labs and Tests  ordered today are summarized above and listed in the Patient Instructions accessible in Encounters.   Signed, Charlie Pitter, PA-C  04/10/2019 11:53 AM    [facility] [place], [place], [place]  [address] Phone: [phone]; Fax: [phone]

## 2019-04-08 ENCOUNTER — Encounter: Payer: Self-pay | Admitting: Physician Assistant

## 2019-04-09 ENCOUNTER — Telehealth: Payer: Self-pay | Admitting: Physician Assistant

## 2019-04-09 NOTE — Telephone Encounter (Signed)

## 2019-04-10 ENCOUNTER — Ambulatory Visit (INDEPENDENT_AMBULATORY_CARE_PROVIDER_SITE_OTHER): Payer: Medicare Other | Admitting: Physician Assistant

## 2019-04-10 ENCOUNTER — Encounter: Payer: Self-pay | Admitting: Physician Assistant

## 2019-04-10 ENCOUNTER — Other Ambulatory Visit: Payer: Self-pay

## 2019-04-10 VITALS — BP 140/64 | HR 69 | Ht 72.0 in | Wt 219.0 lb

## 2019-04-10 DIAGNOSIS — R748 Abnormal levels of other serum enzymes: Secondary | ICD-10-CM | POA: Diagnosis not present

## 2019-04-10 DIAGNOSIS — I2510 Atherosclerotic heart disease of native coronary artery without angina pectoris: Secondary | ICD-10-CM | POA: Diagnosis not present

## 2019-04-10 DIAGNOSIS — E785 Hyperlipidemia, unspecified: Secondary | ICD-10-CM

## 2019-04-10 DIAGNOSIS — I10 Essential (primary) hypertension: Secondary | ICD-10-CM

## 2019-04-10 MED ORDER — LISINOPRIL 10 MG PO TABS: 10.0000 mg | ORAL_TABLET | Freq: Every day | ORAL | 3 refills | Status: DC

## 2019-04-10 NOTE — Patient Instructions (Addendum)
Medication Instructions:  Your physician has recommended you make the following change in your medication:  1.  INCREASE the Lisinopril to 10 mg.  Take 2 of the 5 mg tablets that you have    If you need a refill on your cardiac medications before your next appointment, please call your pharmacy.   Lab work: AT YOUR NEXT FASTING:  CMET, CBC, LIPID, CK, & DIRECT LDL  If you have labs (blood work) drawn today and your tests are completely normal, you will receive your results only by: Marland Kitchen MyChart Message (if you have MyChart) OR . A paper copy in the mail If you have any lab test that is abnormal or we need to change your treatment, we will call you to review the results.  Testing/Procedures: None ordered  Follow-Up: At [facility], you and your health needs are our priority.  As part of our continuing mission to provide you with exceptional heart care, we have created designated Provider Care Teams.  These Care Teams include your primary Cardiologist (physician) and Advanced Practice Providers (APPs -  Physician Assistants and Nurse Practitioners) who all work together to provide you with the care you need, when you need it. You will need a follow up appointment in 12 months.  Please call our office 2 months in advance to schedule this appointment.  You may see Lauree Chandler, MD or one of the following Advanced Practice Providers on your designated Care Team:   [place], PA-C Melina Copa, PA-C . Ermalinda Barrios, PA-C  Any Other Special Instructions Will Be Listed Below (If Applicable).  To check your blood pressure, choose a time about 3 hours after taking your blood pressure medicines. Remain seated in a chair for 5 minutes quietly beforehand, then check it. Please check your blood pressure for several days and send [place] in a Mychart message with those readings. With your medical history we are aiming for a goal blood pressure between 115-130 on the top number.    DASH  Eating Plan DASH stands for "Dietary Approaches to Stop Hypertension." The DASH eating plan is a healthy eating plan that has been shown to reduce high blood pressure (hypertension). It may also reduce your risk for type 2 diabetes, heart disease, and stroke. The DASH eating plan may also help with weight loss. What are tips for following this plan?  General guidelines  Avoid eating more than 2,300 mg (milligrams) of salt (sodium) a day. If you have hypertension, you may need to reduce your sodium intake to 1,500 mg a day.  Limit alcohol intake to no more than 1 drink a day for nonpregnant women and 2 drinks a day for men. One drink equals 12 oz of beer, 5 oz of wine, or 1 oz of hard liquor.  Work with your health care provider to maintain a healthy body weight or to lose weight. Ask what an ideal weight is for you.  Get at least 30 minutes of exercise that causes your heart to beat faster (aerobic exercise) most days of the week. Activities may include walking, swimming, or biking.  Work with your health care provider or diet and nutrition specialist (dietitian) to adjust your eating plan to your individual calorie needs. Reading food labels   Check food labels for the amount of sodium per serving. Choose foods with less than 5 percent of the Daily Value of sodium. Generally, foods with less than 300 mg of sodium per serving fit into this eating plan.  To  find whole grains, look for the word "whole" as the first word in the ingredient list. Shopping  Buy products labeled as "low-sodium" or "no salt added."  Buy fresh foods. Avoid canned foods and premade or frozen meals. Cooking  Avoid adding salt when cooking. Use salt-free seasonings or herbs instead of table salt or sea salt. Check with your health care provider or pharmacist before using salt substitutes.  Do not fry foods. Cook foods using healthy methods such as baking, boiling, grilling, and broiling instead.  Cook with  heart-healthy oils, such as olive, canola, soybean, or sunflower oil. Meal planning  Eat a balanced diet that includes: ? 5 or more servings of fruits and vegetables each day. At each meal, try to fill half of your plate with fruits and vegetables. ? Up to 6-8 servings of whole grains each day. ? Less than 6 oz of lean meat, poultry, or fish each day. A 3-oz serving of meat is about the same size as a deck of cards. One egg equals 1 oz. ? 2 servings of low-fat dairy each day. ? A serving of nuts, seeds, or beans 5 times each week. ? Heart-healthy fats. Healthy fats called Omega-3 fatty acids are found in foods such as flaxseeds and coldwater fish, like sardines, salmon, and mackerel.  Limit how much you eat of the following: ? Canned or prepackaged foods. ? Food that is high in trans fat, such as fried foods. ? Food that is high in saturated fat, such as fatty meat. ? Sweets, desserts, sugary drinks, and other foods with added sugar. ? Full-fat dairy products.  Do not salt foods before eating.  Try to eat at least 2 vegetarian meals each week.  Eat more home-cooked food and less restaurant, buffet, and fast food.  When eating at a restaurant, ask that your food be prepared with less salt or no salt, if possible. What foods are recommended? The items listed may not be a complete list. Talk with your dietitian about what dietary choices are best for you. Grains Whole-grain or whole-wheat bread. Whole-grain or whole-wheat pasta. Brown rice. Modena Morrow. Bulgur. Whole-grain and low-sodium cereals. Pita bread. Low-fat, low-sodium crackers. Whole-wheat flour tortillas. Vegetables Fresh or frozen vegetables (raw, steamed, roasted, or grilled). Low-sodium or reduced-sodium tomato and vegetable juice. Low-sodium or reduced-sodium tomato sauce and tomato paste. Low-sodium or reduced-sodium canned vegetables. Fruits All fresh, dried, or frozen fruit. Canned fruit in natural juice (without  added sugar). Meat and other protein foods Skinless chicken or [place]. Ground chicken or [place]. Pork with fat trimmed off. Fish and seafood. Egg whites. Dried beans, peas, or lentils. Unsalted nuts, nut butters, and seeds. Unsalted canned beans. Lean cuts of beef with fat trimmed off. Low-sodium, lean deli meat. Dairy Low-fat (1%) or fat-free (skim) milk. Fat-free, low-fat, or reduced-fat cheeses. Nonfat, low-sodium ricotta or cottage cheese. Low-fat or nonfat yogurt. Low-fat, low-sodium cheese. Fats and oils Soft margarine without trans fats. Vegetable oil. Low-fat, reduced-fat, or light mayonnaise and salad dressings (reduced-sodium). Canola, safflower, olive, soybean, and sunflower oils. Avocado. Seasoning and other foods Herbs. Spices. Seasoning mixes without salt. Unsalted popcorn and pretzels. Fat-free sweets. What foods are not recommended? The items listed may not be a complete list. Talk with your dietitian about what dietary choices are best for you. Grains Baked goods made with fat, such as croissants, muffins, or some breads. Dry pasta or rice meal packs. Vegetables Creamed or fried vegetables. Vegetables in a cheese sauce. Regular canned vegetables (not low-sodium  or reduced-sodium). Regular canned tomato sauce and paste (not low-sodium or reduced-sodium). Regular tomato and vegetable juice (not low-sodium or reduced-sodium). Angie Fava. Olives. Fruits Canned fruit in a light or heavy syrup. Fried fruit. Fruit in cream or butter sauce. Meat and other protein foods Fatty cuts of meat. Ribs. Fried meat. Berniece Salines. Sausage. Bologna and other processed lunch meats. Salami. Fatback. Hotdogs. Bratwurst. Salted nuts and seeds. Canned beans with added salt. Canned or smoked fish. Whole eggs or egg yolks. Chicken or [place] with skin. Dairy Whole or 2% milk, cream, and half-and-half. Whole or full-fat cream cheese. Whole-fat or sweetened yogurt. Full-fat cheese. Nondairy creamers. Whipped toppings.  Processed cheese and cheese spreads. Fats and oils Butter. Stick margarine. Lard. Shortening. Ghee. Bacon fat. Tropical oils, such as coconut, palm kernel, or palm oil. Seasoning and other foods Salted popcorn and pretzels. Onion salt, garlic salt, seasoned salt, table salt, and sea salt. Worcestershire sauce. Tartar sauce. Barbecue sauce. Teriyaki sauce. Soy sauce, including reduced-sodium. Steak sauce. Canned and packaged gravies. Fish sauce. Oyster sauce. Cocktail sauce. Horseradish that you find on the shelf. Ketchup. Mustard. Meat flavorings and tenderizers. Bouillon cubes. Hot sauce and Tabasco sauce. Premade or packaged marinades. Premade or packaged taco seasonings. Relishes. Regular salad dressings. Where to find more information:  National Heart, Lung, and [place]: https://wilson-eaton.com/  American Heart Association: www.heart.org Summary  The DASH eating plan is a healthy eating plan that has been shown to reduce high blood pressure (hypertension). It may also reduce your risk for type 2 diabetes, heart disease, and stroke.  With the DASH eating plan, you should limit salt (sodium) intake to 2,300 mg a day. If you have hypertension, you may need to reduce your sodium intake to 1,500 mg a day.  When on the DASH eating plan, aim to eat more fresh fruits and vegetables, whole grains, lean proteins, low-fat dairy, and heart-healthy fats.  Work with your health care provider or diet and nutrition specialist (dietitian) to adjust your eating plan to your individual calorie needs. This information is not intended to replace advice given to you by your health care provider. Make sure you discuss any questions you have with your health care provider. Document Released: 08/25/2011 Document Revised: 08/18/2017 Document Reviewed: 08/29/2016 Elsevier Patient Education  2020 [employer].

## 2019-04-11 ENCOUNTER — Other Ambulatory Visit: Payer: Medicare Other | Admitting: *Deleted

## 2019-04-11 DIAGNOSIS — R748 Abnormal levels of other serum enzymes: Secondary | ICD-10-CM | POA: Diagnosis not present

## 2019-04-11 DIAGNOSIS — I10 Essential (primary) hypertension: Secondary | ICD-10-CM | POA: Diagnosis not present

## 2019-04-11 DIAGNOSIS — I2510 Atherosclerotic heart disease of native coronary artery without angina pectoris: Secondary | ICD-10-CM

## 2019-04-11 DIAGNOSIS — E785 Hyperlipidemia, unspecified: Secondary | ICD-10-CM

## 2019-04-11 LAB — CBC
Hematocrit: 46.1 % (ref 37.5–51.0)
Hemoglobin: 15 g/dL (ref 13.0–17.7)
MCH: 29.6 pg (ref 26.6–33.0)
MCHC: 32.5 g/dL (ref 31.5–35.7)
MCV: 91 fL (ref 79–97)
Platelets: 205 10*3/uL (ref 150–450)
RBC: 5.06 x10E6/uL (ref 4.14–5.80)
RDW: 13 % (ref 11.6–15.4)
WBC: 7.3 10*3/uL (ref 3.4–10.8)

## 2019-04-11 LAB — COMPREHENSIVE METABOLIC PANEL
ALT: 26 IU/L (ref 0–44)
AST: 29 IU/L (ref 0–40)
Albumin/Globulin Ratio: 2.3 — ABNORMAL HIGH (ref 1.2–2.2)
Albumin: 4.6 g/dL (ref 3.7–4.7)
Alkaline Phosphatase: 68 IU/L (ref 39–117)
BUN/Creatinine Ratio: 25 — ABNORMAL HIGH (ref 10–24)
BUN: 21 mg/dL (ref 8–27)
Bilirubin Total: 0.3 mg/dL (ref 0.0–1.2)
CO2: 23 mmol/L (ref 20–29)
Calcium: 9.5 mg/dL (ref 8.6–10.2)
Chloride: 101 mmol/L (ref 96–106)
Creatinine, Ser: 0.85 mg/dL (ref 0.76–1.27)
GFR calc Af Amer: 99 mL/min/{1.73_m2} (ref >59)
GFR calc non Af Amer: 86 mL/min/{1.73_m2} (ref >59)
Globulin, Total: 2 g/dL (ref 1.5–4.5)
Glucose: 134 mg/dL — ABNORMAL HIGH (ref 65–99)
Potassium: 4.4 mmol/L (ref 3.5–5.2)
Sodium: 140 mmol/L (ref 134–144)
Total Protein: 6.6 g/dL (ref 6.0–8.5)

## 2019-04-11 LAB — LIPID PANEL
Chol/HDL Ratio: 4.6 ratio (ref 0.0–5.0)
Cholesterol, Total: 125 mg/dL (ref 100–199)
HDL: 27 mg/dL — ABNORMAL LOW (ref >39)
LDL Calculated: 40 mg/dL (ref 0–99)
Triglycerides: 288 mg/dL — ABNORMAL HIGH (ref 0–149)
VLDL Cholesterol Cal: 58 mg/dL — ABNORMAL HIGH (ref 5–40)

## 2019-04-11 LAB — LDL CHOLESTEROL, DIRECT: LDL Direct: 61 mg/dL (ref 0–99)

## 2019-04-11 LAB — CK: Total CK: 125 U/L (ref 41–331)

## 2019-04-12 ENCOUNTER — Telehealth: Payer: Self-pay | Admitting: *Deleted

## 2019-04-12 MED ORDER — VASCEPA 1 G PO CAPS: 2.0000 g | ORAL_CAPSULE | Freq: Two times a day (BID) | ORAL | Status: DC

## 2019-04-12 NOTE — Telephone Encounter (Signed)
-----   Message from Charlie Pitter, [place] sent at 04/11/2019  5:40 PM EDT ----- Please call patient. I am quite pleased at his labs. Kidney function, electrolytes, liver all look good. Blood count is normal. His CK is actually normal. His triglycerides are improving. His LDL is at goal. While I think his triglycerides could use some improvement, the CK level has presented an issue in the past with regards to increasing therapy. We have 2 options at this juncture. We can either a) add Vascepa 2g BID as previously instructed by the lipid clinic or b) have him begin to work very hard at diet and lifestyle - he had reported incorporating bacon into breakfast, 1-2 drinks nightly, etc and he is in the "overweight" category by body mass index as well. If he can work hard on diet, it would be reasonable to repeat in 3 months with CK, liver, lipid profile to reassess. But if he does not think he can seriously make lifestyle changes, Vascepa is the way to go.

## 2019-04-15 ENCOUNTER — Telehealth: Payer: Self-pay | Admitting: *Deleted

## 2019-04-15 DIAGNOSIS — E785 Hyperlipidemia, unspecified: Secondary | ICD-10-CM

## 2019-04-15 DIAGNOSIS — I2510 Atherosclerotic heart disease of native coronary artery without angina pectoris: Secondary | ICD-10-CM

## 2019-04-15 NOTE — Telephone Encounter (Signed)
Called pt re: setting up labs in 3 months (07/16/2019) left a message for him to call back.

## 2019-04-30 ENCOUNTER — Encounter: Payer: Self-pay | Admitting: Gastroenterology

## 2019-04-30 ENCOUNTER — Other Ambulatory Visit: Payer: Self-pay

## 2019-04-30 ENCOUNTER — Ambulatory Visit (INDEPENDENT_AMBULATORY_CARE_PROVIDER_SITE_OTHER): Payer: Medicare Other | Admitting: Gastroenterology

## 2019-04-30 VITALS — BP 142/58 | HR 88 | Temp 97.8°F | Ht 72.0 in | Wt 218.0 lb

## 2019-04-30 DIAGNOSIS — K648 Other hemorrhoids: Secondary | ICD-10-CM | POA: Diagnosis not present

## 2019-04-30 NOTE — Progress Notes (Signed)
PROCEDURE NOTE: The patient presents with symptomatic grade 2 hemorrhoids, requesting rubber band ligation of his/her hemorrhoidal disease. All risks, benefits and alternative forms of therapy were described and informed consent was obtained.  DRE revealed: normal   The anorectum was pre-medicated with 0.125% NTG and lubricant. The decision was made to band the LL internal hemorrhoids, and the [place] was used to perform band ligation without complication. Digital anorectal examination was then performed to assure proper positioning of the band, and to adjust the banded tissue as required. The patient was discharged home without pain or other issues. Dietary and behavioral recommendations were given and along with follow-up instructions.   The following adjunctive treatments were recommended:  none  The patient will return prn  for follow-up and possible additional banding as required. No complications were encountered and the patient tolerated the procedure well.

## 2019-04-30 NOTE — Patient Instructions (Addendum)
If you are age 75 or older, your body mass index should be between 23-30. Your Body mass index is 29.57 kg/m. If this is out of the aforementioned range listed, please consider follow up with your Primary Care Provider.  If you are age 29 or younger, your body mass index should be between 19-25. Your Body mass index is 29.57 kg/m. If this is out of the aformentioned range listed, please consider follow up with your Primary Care Provider.   HEMORRHOID BANDING PROCEDURE    FOLLOW-UP CARE   1. The procedure you have had should have been relatively painless since the banding of the area involved does not have nerve endings and there is no pain sensation.  The rubber band cuts off the blood supply to the hemorrhoid and the band may fall off as soon as 48 hours after the banding (the band may occasionally be seen in the toilet bowl following a bowel movement). You may notice a temporary feeling of fullness in the rectum which should respond adequately to plain Tylenol or Motrin.  2. Following the banding, avoid strenuous exercise that evening and resume full activity the next day.  A sitz bath (soaking in a warm tub) or bidet is soothing, and can be useful for cleansing the area after bowel movements.     3. To avoid constipation, take two tablespoons of natural wheat bran, natural oat bran, flax, Benefiber or any over the counter fiber supplement and increase your water intake to 7-8 glasses daily.    4. Unless you have been prescribed anorectal medication, do not put anything inside your rectum for two weeks: No suppositories, enemas, fingers, etc.  5. Occasionally, you may have more bleeding than usual after the banding procedure.  This is often from the untreated hemorrhoids rather than the treated one.  Don't be concerned if there is a tablespoon or so of blood.  If there is more blood than this, lie flat with your bottom higher than your head and apply an ice pack to the area. If the bleeding  does not stop within a half an hour or if you feel faint, call our office at [phone] or go to the emergency room.  6. Problems are not common; however, if there is a substantial amount of bleeding, severe pain, chills, fever or difficulty passing urine (very rare) or other problems, you should call us at [phone] or report to the nearest emergency room.  7. Do not stay seated continuously for more than 2-3 hours for a day or two after the procedure.  Tighten your buttock muscles 10-15 times every two hours and take 10-15 deep breaths every 1-2 hours.  Do not spend more than a few minutes on the toilet if you cannot empty your bowel; instead re-visit the toilet at a later time.    Follow up as needed. It was a pleasure to see you today!   Dr. Loletha Carrow

## 2019-05-01 ENCOUNTER — Encounter: Payer: Self-pay | Admitting: Physical Medicine and Rehabilitation

## 2019-05-01 DIAGNOSIS — M542 Cervicalgia: Secondary | ICD-10-CM | POA: Diagnosis not present

## 2019-05-01 MED ORDER — METHYLPREDNISOLONE ACETATE 40 MG/ML IJ SUSP
40.0000 mg | INTRAMUSCULAR | Status: AC | PRN
  Administered 2019-05-01: 40 mg via INTRAMUSCULAR

## 2019-05-01 MED ORDER — LIDOCAINE HCL (PF) 1 % IJ SOLN
3.0000 mL | INTRAMUSCULAR | Status: AC | PRN
  Administered 2019-05-01: 3 mL

## 2019-05-01 NOTE — Progress Notes (Signed)
Brent [name]. - 73 y.o. male MRN [id number]  Date of birth: 02/21/1946  Office Visit Note: Visit Date: 03/13/2019 PCP: [name], [name], MD Referred by: [name], Estel*  Subjective: Chief Complaint  Patient presents with  . Neck - Pain  . Middle Back - Pain  . Left Shoulder - Pain   HPI: Brent [name]. is a 73 y.o. male who comes in today for reevaluation of his left-sided neck and upper back and left shoulder pain.  He has a history of malignant melanoma resection in the left posterior neck with pretty extensive resection.  We did complete auricular nerve block and trigger point type injection which seemed to help with the time.Marland Kitchen  He reports he is better overall than he has been.  He still using Pennsaid2 and some over-the-counter salves.  He now states he has a hard time abducting his left arm up without a lot of pain.  The pain is not over the lateral shoulder or into the arm or hand.  He has no numbness tingling or paresthesia.  He says if he moves the right arm he actually feels it in the left upper back.  He has no right arm pain.  He is left-hand dominant.  He feels like there is a significant knot that he can press and it reproduces most of his pain.  I can feel this today and is pretty significant.  He denies any real occipital headache or paresthesias in the arms or hands.  He does have diabetes and heart disease.  He does use some Ambien at night and he has at times use lorazepam during the day for anxiety.  We did update MRI of the cervical spine and soft tissue and this did not show any specific nerve damage.  Moving his arm or working seems to increase the pain in his pain, increases over time.  Review of Systems  Constitutional: Negative for chills, fever, malaise/fatigue and weight loss.  HENT: Negative for hearing loss and sinus pain.   Eyes: Negative for blurred vision, double vision and photophobia.  Respiratory: Negative for cough and shortness  of breath.   Cardiovascular: Negative for chest pain, palpitations and leg swelling.  Gastrointestinal: Negative for abdominal pain, nausea and vomiting.  Genitourinary: Negative for flank pain.  Musculoskeletal: Positive for back pain and neck pain. Negative for myalgias.  Skin: Negative for itching and rash.  Neurological: Negative for tremors, focal weakness and weakness.  Endo/Heme/Allergies: Negative.   Psychiatric/Behavioral: Negative for depression.  All other systems reviewed and are negative.  Otherwise per HPI.  Assessment & Plan: Visit Diagnoses: No diagnosis found.  Plan: Findings:  Left more than right axial neck pain really in the distribution probably more myofascial but could be somewhat related to cervical arthritis.  He does have active trigger points on palpation and I do think this is a lot of the problem.  Prior injections have helped.  We have discussed different types of stretching and relief during the day when he can to get the forward flexion posture to be reduced in relaxation and maybe this can help the musculature to some degree.  We have also talked about doing isometric neck exercises and we have showed him these.  He does have some level of underlying anxiety and myofascial pain syndrome.  We will go ahead today and inject the area around the neck as we did before and to see if we can get him some relief.  At the same time we are going to try to get him on board with probably [facility] physical therapy for dry needling.  We will continue to watch.  He can continue use the topical Voltaren.  Voltaren gel is now over-the-counter and we talked about that.    Meds & Orders: No orders of the defined types were placed in this encounter.   Orders Placed This Encounter  Procedures  . Trigger Point Inj    Follow-up: Return if symptoms worsen or fail to improve.   Procedures: Muscle tendon injection  Date/Time: 05/01/2019 5:44 AM Performed by: Magnus Sinning, MD  Authorized by: Magnus Sinning, MD   Consent Given by:  Patient Site marked: the procedure site was marked   Timeout: prior to procedure the correct patient, procedure, and site was verified   Total # of Trigger Points:  1 Location: neck and back   Needle Size:  25 G Approach:  Dorsal Medications #1:  40 mg methylPREDNISolone acetate 40 MG/ML; 3 mL lidocaine (PF) 1 % Patient tolerance:  Patient tolerated the procedure well with no immediate complications Comments: Appropriate areas over the levator scapula and supraspinatus were injected along the muscle tendon insertion.    No notes on file   Clinical History: CT CERVICAL SPINE FINDINGS  There is no fracture or spondylolisthesis. Prevertebral soft tissues and predental space regions are normal. There is mild disc space narrowing at C4-5 with slightly greater narrowing at C5-6 and C6-7. There are prominent anterior osteophytes at C5 and C6. There is facet hypertrophy at multiple levels without nerve root edema or effacement. No disc extrusion or high-grade stenosis. There are foci of carotid artery calcification bilaterally.  IMPRESSION: CT head: Mild sulcal atrophy diffusely. The ventricles appear normal. There is a posterior fossa arachnoid cyst with remodeling of cerebellar hemispheres, a chronic finding, more pronounced on the right than on the left. No intracranial hemorrhage, extra-axial fluid collection, or edema. No acute infarct evident.  CT cervical spine: Osteoarthritic change at multiple levels. No fracture or spondylolisthesis. Scattered foci of carotid artery calcification.   Electronically Signed   By: Lowella Grip III M.D.   On: 07/03/2015 13:09   He reports that he quit smoking about 38 years ago. His smoking use included cigarettes. He has a 45.00 pack-year smoking history. He has never used smokeless tobacco.  Recent Labs    05/24/18 1235 11/22/18 1108  HGBA1C 6.0* 6.3*    Objective:   VS:  HT:    WT:   BMI:     BP:(!) 148/68  HR:74bpm  TEMP: ( )  RESP:  Physical Exam Vitals signs and nursing note reviewed.  Constitutional:      General: He is not in acute distress.    Appearance: Normal appearance. He is well-developed.  HENT:     Head: Normocephalic and atraumatic.  Eyes:     Conjunctiva/sclera: Conjunctivae normal.     Pupils: Pupils are equal, round, and reactive to light.  Neck:     Musculoskeletal: Neck supple. Muscular tenderness present. No neck rigidity.  Cardiovascular:     Rate and Rhythm: Normal rate.     Pulses: Normal pulses.     Heart sounds: Normal heart sounds.  Pulmonary:     Effort: Pulmonary effort is normal. No respiratory distress.  Musculoskeletal:     Right lower leg: No edema.     Left lower leg: No edema.     Comments: Patient sits with forward flexed cervical spine he has  well-healed surgical scar on the left posterior neck.  He has negative Tinel's over the occipital nerves.  He has pain at end ranges of rotation left and right.  He has active trigger points in the splenius capitis as well as trapezius levator scapula and supraspinatus.  He has mild shoulder impingement left more than right.  He has good strength in the upper extremities bilaterally.  Negative Hoffmann's.  Lymphadenopathy:     Cervical: No cervical adenopathy.  Skin:    General: Skin is warm and dry.     Findings: No erythema or rash.  Neurological:     General: No focal deficit present.     Mental Status: He is alert and oriented to person, place, and time.     Sensory: No sensory deficit.     Coordination: Coordination normal.     Gait: Gait normal.  Psychiatric:        Mood and Affect: Mood normal.        Behavior: Behavior normal.     Ortho Exam Imaging: No results found.  Past Medical/Family/Surgical/Social History: Medications & Allergies reviewed per EMR, new medications updated. Patient Active Problem List   Diagnosis Date Noted  . Malignant  melanoma of left side of neck ([place]) 10/25/2018  . Facial neuritis 10/25/2018  . Myofascial pain syndrome 10/25/2018  . Occipital neuralgia of left side 10/25/2018  . CAP (community acquired pneumonia) 09/08/2017  . Viral URI with cough 08/02/2017  . Male hypogonadism 06/20/2016  . Renal lesion 06/20/2016  . Insomnia 08/07/2015  . Enlarged prostate with lower urinary tract symptoms (LUTS) 11/07/2014  . Balanitis 07/30/2012  . Bladder neck obstruction 07/30/2012  . Prostate nodule 06/18/2012  . Recurrent nephrolithiasis 06/18/2012  . Urinary urgency 06/18/2012  . Benign neoplasm of colon 08/29/2011  . DEPRESSION, SITUATIONAL, ACUTE 01/23/2010  . Essential hypertension 05/30/2009  . Coronary atherosclerosis 05/30/2009  . Obstructive sleep apnea 11/28/2008  . OBESITY 09/23/2008  . ERECTILE DYSFUNCTION 11/26/2007  . Well controlled type 2 diabetes mellitus with peripheral circulatory disorder ([place]) 05/09/2007  . Hyperlipidemia 05/09/2007  . MYOCARDIAL INFARCTION, HX OF 05/09/2007  . DIVERTICULOSIS, COLON 05/09/2007   Past Medical History:  Diagnosis Date  . Allergy   . CAD (coronary artery disease)    a. angioplasty of his RCA in 1990. b. bare metal stent placed in the RCA in 2000 followed by rotational atherectomy shortly after for stent restenosis. c. last cath was in 2012 showing stable moderate diffuse CAD. (70% mid LAD, 80% diagonal, 70% Ramus, 40% mid to distal RCA stent restenosis). d. Low risk nuc in 2015.  . Diabetes mellitus   . Diverticulosis   . Elevated CK   . Erectile dysfunction   . Hemorrhoids   . HTN (hypertension)   . Hyperlipidemia   . Hypertriglyceridemia   . Malignant melanoma of left side of neck ([place]) 10/25/2018  . Myocardial infarction ([place])   . Obesity   . OSA (obstructive sleep apnea)   . Persistent disorder of initiating or maintaining sleep   . Personal history of colonic polyps 02/05/2003   Family History  Problem Relation Age of Onset  . Liver  cancer Father   . Lung cancer Father   . Heart disease Father   . Heart disease Sister   . Lung cancer Mother   . COPD Mother   . Obesity Mother   . Colon cancer Neg Hx   . Stomach cancer Neg Hx   . Pancreatic cancer Neg Hx  Past Surgical History:  Procedure Laterality Date  . CHOLECYSTECTOMY    . CORONARY ANGIOPLASTY    . CORONARY STENT PLACEMENT     stenting of the right coronary artery with followup rotational  atherectomy. (3 stents placed)  . FINGER SURGERY     right  . FOOT SURGERY     right  . INGUINAL HERNIA REPAIR     right  . melanoma removal     neck  . ORTHOPEDIC SURGERY     foot right  . rotator cuff surg     Bil   Social History   Occupational History  . Occupation: Teacher, English as a foreign language    Comment: [place] Use  . Smoking status: Former Smoker    Packs/day: 1.50    Years: 30.00    Pack years: 45.00    Types: Cigarettes    Quit date: 09/19/1980    Years since quitting: 38.6  . Smokeless tobacco: Never Used  Substance and Sexual Activity  . Alcohol use: Yes    Alcohol/week: 7.0 - 14.0 standard drinks    Types: 7 - 14 Shots of liquor per week  . Drug use: No  . Sexual activity: Not on file

## 2019-05-07 DIAGNOSIS — Z85820 Personal history of malignant melanoma of skin: Secondary | ICD-10-CM | POA: Diagnosis not present

## 2019-05-07 DIAGNOSIS — Z85828 Personal history of other malignant neoplasm of skin: Secondary | ICD-10-CM | POA: Diagnosis not present

## 2019-05-07 DIAGNOSIS — D692 Other nonthrombocytopenic purpura: Secondary | ICD-10-CM | POA: Diagnosis not present

## 2019-05-07 DIAGNOSIS — L820 Inflamed seborrheic keratosis: Secondary | ICD-10-CM | POA: Diagnosis not present

## 2019-05-07 DIAGNOSIS — L570 Actinic keratosis: Secondary | ICD-10-CM | POA: Diagnosis not present

## 2019-05-07 DIAGNOSIS — D485 Neoplasm of uncertain behavior of skin: Secondary | ICD-10-CM | POA: Diagnosis not present

## 2019-05-24 ENCOUNTER — Ambulatory Visit: Payer: Medicare Other | Admitting: Internal Medicine

## 2019-05-24 ENCOUNTER — Other Ambulatory Visit: Payer: Self-pay

## 2019-05-24 ENCOUNTER — Ambulatory Visit (INDEPENDENT_AMBULATORY_CARE_PROVIDER_SITE_OTHER): Payer: Medicare Other | Admitting: Internal Medicine

## 2019-05-24 ENCOUNTER — Encounter: Payer: Self-pay | Admitting: Internal Medicine

## 2019-05-24 VITALS — BP 120/70 | HR 75 | Temp 97.7°F | Wt 218.3 lb

## 2019-05-24 DIAGNOSIS — F341 Dysthymic disorder: Secondary | ICD-10-CM

## 2019-05-24 DIAGNOSIS — E1151 Type 2 diabetes mellitus with diabetic peripheral angiopathy without gangrene: Secondary | ICD-10-CM | POA: Diagnosis not present

## 2019-05-24 DIAGNOSIS — I10 Essential (primary) hypertension: Secondary | ICD-10-CM | POA: Diagnosis not present

## 2019-05-24 DIAGNOSIS — Z23 Encounter for immunization: Secondary | ICD-10-CM | POA: Diagnosis not present

## 2019-05-24 DIAGNOSIS — I2510 Atherosclerotic heart disease of native coronary artery without angina pectoris: Secondary | ICD-10-CM | POA: Diagnosis not present

## 2019-05-24 DIAGNOSIS — E782 Mixed hyperlipidemia: Secondary | ICD-10-CM

## 2019-05-24 LAB — POCT GLYCOSYLATED HEMOGLOBIN (HGB A1C): Hemoglobin A1C: 6.4 % — AB (ref 4.0–5.6)

## 2019-05-24 NOTE — Patient Instructions (Signed)
-  Nice seeing you today!!  -Diabetes looks good!! Keep up the good work.  -Schedule follow up in 3 months.

## 2019-05-24 NOTE — Addendum Note (Signed)
Addended by: Westley Hummer B on: 05/24/2019 09:53 AM   Modules accepted: Orders

## 2019-05-24 NOTE — Progress Notes (Signed)
Established Patient Office Visit     CC/Reason for Visit: Diabetic follow-up  HPI: Brent [name]. is a 73 y.o. male who is coming in today for the above mentioned reasons. Past Medical History is significant for: Insulin-dependent diabetes, hypertension, hyperlipidemia, coronary artery disease as well as a history of anxiety and depression.  All of these are well controlled, he follows with cardiology.  In the last few months he has been having issues with hypoglycemia and we had decreased his Lantus from 60 to 50 units at bedtime.  He does not bring in his CBG log today but tells me that on average his sugars throughout the day have been around 150.  He has had no further hypoglycemic events.  Foot exam to be performed today.  He worries about a tingling/shooting type pain that he has around his left little toe and would like me to examine it.  He brings in today the report of some genetic testing as he thought that I had ordered it.  I did not.  It states that he has an SCN5A gene mutation and that the clinical significance of this is not established.  I doubt this has any clinical importance but have asked him to forward the results to his cardiologist.   Past Medical/Surgical History: Past Medical History:  Diagnosis Date  . Allergy   . CAD (coronary artery disease)    a. angioplasty of his RCA in 1990. b. bare metal stent placed in the RCA in 2000 followed by rotational atherectomy shortly after for stent restenosis. c. last cath was in 2012 showing stable moderate diffuse CAD. (70% mid LAD, 80% diagonal, 70% Ramus, 40% mid to distal RCA stent restenosis). d. Low risk nuc in 2015.  . Diabetes mellitus   . Diverticulosis   . Elevated CK   . Erectile dysfunction   . Hemorrhoids   . HTN (hypertension)   . Hyperlipidemia   . Hypertriglyceridemia   . Malignant melanoma of left side of neck ([place]) 10/25/2018  . Myocardial infarction ([place])   . Obesity   . OSA (obstructive sleep  apnea)   . Persistent disorder of initiating or maintaining sleep   . Personal history of colonic polyps 02/05/2003    Past Surgical History:  Procedure Laterality Date  . CHOLECYSTECTOMY    . CORONARY ANGIOPLASTY    . CORONARY STENT PLACEMENT     stenting of the right coronary artery with followup rotational  atherectomy. (3 stents placed)  . FINGER SURGERY     right  . FOOT SURGERY     right  . INGUINAL HERNIA REPAIR     right  . melanoma removal     neck  . ORTHOPEDIC SURGERY     foot right  . rotator cuff surg     Bil    Social History:  reports that he quit smoking about 38 years ago. His smoking use included cigarettes. He has a 45.00 pack-year smoking history. He has never used smokeless tobacco. He reports current alcohol use of about 7.0 - 14.0 standard drinks of alcohol per week. He reports that he does not use drugs.  Allergies: Allergies  Allergen Reactions  . Penicillins Hives and Rash    Family History:  Family History  Problem Relation Age of Onset  . Liver cancer Father   . Lung cancer Father   . Heart disease Father   . Heart disease Sister   . Lung cancer Mother   .  COPD Mother   . Obesity Mother   . Colon cancer Neg Hx   . Stomach cancer Neg Hx   . Pancreatic cancer Neg Hx      Current Outpatient Medications:  .  aspirin 81 MG tablet, Take 81 mg by mouth daily., Disp: , Rfl:  .  empagliflozin (JARDIANCE) 25 MG TABS tablet, Take 25 mg by mouth daily., Disp: , Rfl:  .  fenofibrate 160 MG tablet, Take 160 mg by mouth daily., Disp: , Rfl:  .  fluticasone (FLONASE) 50 MCG/ACT nasal spray, Place 2 sprays into both nostrils daily., Disp: 48 g, Rfl: 3 .  insulin aspart (NOVOLOG FLEXPEN) 100 UNIT/ML FlexPen, Inject 40 Units 3 (three) times daily with meals into the skin., Disp: , Rfl:  .  insulin glargine (LANTUS) 100 unit/mL SOPN, Inject 50 Units into the skin at bedtime., Disp: , Rfl:  .  lisinopril (ZESTRIL) 10 MG tablet, Take 1 tablet (10 mg  total) by mouth daily., Disp: 90 tablet, Rfl: 3 .  LORazepam (ATIVAN) 1 MG tablet, TAKE 1 TABLET TWICE A DAY AS NEEDED FOR ANXIETY, Disp: 180 tablet, Rfl: 1 .  metFORMIN (GLUCOPHAGE) 1000 MG tablet, Take 1,000 mg by mouth 2 (two) times daily with a meal., Disp: , Rfl:  .  Multiple Vitamins-Minerals (CENTRUM SILVER PO), 1 tab po qd , Disp: , Rfl:  .  rosuvastatin (CRESTOR) 40 MG tablet, Take 1 tablet (40 mg total) by mouth daily., Disp: , Rfl:  .  sertraline (ZOLOFT) 100 MG tablet, Take 150 mg by mouth daily. Total of 150mg  daily, Disp: , Rfl:  .  vitamin B-12 (CYANOCOBALAMIN) 1000 MCG tablet, Take 1,000 mcg by mouth daily.  , Disp: , Rfl:  .  zolpidem (AMBIEN) 10 MG tablet, Take 10 mg by mouth at bedtime as needed. For insomnia, Disp: , Rfl:   Review of Systems:  Constitutional: Denies fever, chills, diaphoresis, appetite change and fatigue.  HEENT: Denies photophobia, eye pain, redness, hearing loss, ear pain, congestion, sore throat, rhinorrhea, sneezing, mouth sores, trouble swallowing, neck pain, neck stiffness and tinnitus.   Respiratory: Denies SOB, DOE, cough, chest tightness,  and wheezing.   Cardiovascular: Denies chest pain, palpitations and leg swelling.  Gastrointestinal: Denies nausea, vomiting, abdominal pain, diarrhea, constipation, blood in stool and abdominal distention.  Genitourinary: Denies dysuria, urgency, frequency, hematuria, flank pain and difficulty urinating.  Endocrine: Denies: hot or cold intolerance, sweats, changes in hair or nails, polyuria, polydipsia. Musculoskeletal: Denies myalgias, back pain, joint swelling, arthralgias and gait problem.  Skin: Denies pallor, rash and wound.  Neurological: Denies dizziness, seizures, syncope, weakness, light-headedness, numbness and headaches.  Hematological: Denies adenopathy. Easy bruising, personal or family bleeding history  Psychiatric/Behavioral: Denies suicidal ideation, mood changes, confusion, nervousness, sleep  disturbance and agitation    Physical Exam: Vitals:   05/24/19 0854  BP: 120/70  Pulse: 75  Temp: 97.7 F (36.5 C)  TempSrc: Temporal  SpO2: 96%  Weight: 218 lb 4.8 oz (99 kg)    Body mass index is 29.61 kg/m.   Constitutional: NAD, calm, comfortable Eyes: PERRL, lids and conjunctivae normal ENMT: Mucous membranes are moist. Respiratory: clear to auscultation bilaterally, no wheezing, no crackles. Normal respiratory effort. No accessory muscle use.  Cardiovascular: Regular rate and rhythm, no murmurs / rubs / gallops. No extremity edema. 2+ pedal pulses. No carotid bruits.  Abdomen: no tenderness, no masses palpated. No hepatosplenomegaly. Bowel sounds positive.  Musculoskeletal: no clubbing / cyanosis. No joint deformity upper and lower extremities.  Good ROM, no contractures. Normal muscle tone.  Neurologic: Grossly intact and nonfocal  Psychiatric: Normal judgment and insight. Alert and oriented x 3. Normal mood.    Diabetic Foot Exam - Simple   Simple Foot Form Diabetic Foot exam was performed with the following findings: Yes 05/24/2019  9:24 AM  Visual Inspection No deformities, no ulcerations, no other skin breakdown bilaterally: Yes Sensation Testing Intact to touch and monofilament testing bilaterally: Yes Pulse Check Posterior Tibialis and Dorsalis pulse intact bilaterally: Yes Comments      Impression and Plan:  Well controlled type 2 diabetes mellitus with peripheral circulatory disorder ([place])  -A1c today is 6.4. -I am not concerned about his average CBGs being around 150 given his A1c and his recent hypoglycemia. -Have advised he continue his Lantus at 15 units at bedtime. -Foot exam performed today without deformities, ulcerations or skin breakdown, sensation intact to pinprick and vibration, pulses were present. -Suspect his foot pain is related to diabetic neuropathy, have discussed treatment options with him, he prefers to observe for now. -He had a  diabetic eye exam in July of this year that was negative for retinopathy.  Essential hypertension -Well-controlled, continue current regimen.  Mixed hyperlipidemia -Well-controlled with a recent LDL of 40 in July 2020.  Atherosclerosis of native coronary artery of native heart without angina pectoris -Follows with cardiology routinely  DEPRESSION, SITUATIONAL, ACUTE -Well-controlled on Zoloft and as needed Ativan.    Patient Instructions  -Nice seeing you today!!  -Diabetes looks good!! Keep up the good work.  -Schedule follow up in 3 months.      Lelon Frohlich, MD [facility]

## 2019-07-16 ENCOUNTER — Other Ambulatory Visit: Payer: Medicare Other

## 2019-07-17 ENCOUNTER — Other Ambulatory Visit: Payer: Medicare Other | Admitting: *Deleted

## 2019-07-17 ENCOUNTER — Other Ambulatory Visit: Payer: Self-pay

## 2019-07-17 ENCOUNTER — Other Ambulatory Visit: Payer: Self-pay | Admitting: Internal Medicine

## 2019-07-17 DIAGNOSIS — G4700 Insomnia, unspecified: Secondary | ICD-10-CM

## 2019-07-17 DIAGNOSIS — I2510 Atherosclerotic heart disease of native coronary artery without angina pectoris: Secondary | ICD-10-CM

## 2019-07-17 DIAGNOSIS — E785 Hyperlipidemia, unspecified: Secondary | ICD-10-CM | POA: Diagnosis not present

## 2019-07-17 LAB — LIPID PANEL
Chol/HDL Ratio: 5.6 ratio — ABNORMAL HIGH (ref 0.0–5.0)
Cholesterol, Total: 129 mg/dL (ref 100–199)
HDL: 23 mg/dL — ABNORMAL LOW (ref >39)
LDL Chol Calc (NIH): 52 mg/dL (ref 0–99)
Triglycerides: 351 mg/dL — ABNORMAL HIGH (ref 0–149)
VLDL Cholesterol Cal: 54 mg/dL — ABNORMAL HIGH (ref 5–40)

## 2019-07-17 LAB — HEPATIC FUNCTION PANEL
ALT: 25 IU/L (ref 0–44)
AST: 30 IU/L (ref 0–40)
Albumin: 4.4 g/dL (ref 3.7–4.7)
Alkaline Phosphatase: 109 IU/L (ref 39–117)
Bilirubin Total: 0.3 mg/dL (ref 0.0–1.2)
Bilirubin, Direct: 0.13 mg/dL (ref 0.00–0.40)
Total Protein: 6.8 g/dL (ref 6.0–8.5)

## 2019-07-19 ENCOUNTER — Telehealth: Payer: Self-pay | Admitting: *Deleted

## 2019-07-19 DIAGNOSIS — G4733 Obstructive sleep apnea (adult) (pediatric): Secondary | ICD-10-CM

## 2019-07-19 DIAGNOSIS — I2510 Atherosclerotic heart disease of native coronary artery without angina pectoris: Secondary | ICD-10-CM

## 2019-07-19 DIAGNOSIS — E1151 Type 2 diabetes mellitus with diabetic peripheral angiopathy without gangrene: Secondary | ICD-10-CM

## 2019-07-19 MED ORDER — VASCEPA 1 G PO CAPS: 2.0000 g | ORAL_CAPSULE | Freq: Two times a day (BID) | ORAL | 3 refills | Status: DC

## 2019-07-19 NOTE — Telephone Encounter (Signed)
See result note 07/17/2019

## 2019-07-19 NOTE — Telephone Encounter (Signed)
-----   Message from Charlie Pitter, [place] sent at 07/17/2019  7:42 PM EDT ----- Please call patient. LDL remains at goal but unfortunately triglycerides look worse. 288->351. LFTs normal. Per prior result note, would add Vascepa 2g BID as previously instructed by the lipid clinic and arrange f/u with them. They will need to f/u his lipid profile, liver and CK level. Jinny Blossom Supple saw him last year. Also needs to continue to work on diet and exercise because long term this is far more powerful than any medication we can prescribe. If he feels he needs some help working through a nutrition/weight loss plan, would refer to [facility] and [place] - these changes are tough for all of [place] to make and they could help empower him to make some lasting impacts on his health. Thanks! Dayna Dunn PA-C

## 2019-07-26 ENCOUNTER — Telehealth: Payer: Self-pay | Admitting: Internal Medicine

## 2019-07-26 NOTE — Telephone Encounter (Signed)
Copied from [place]. Topic: General - Other >> Jul 26, 2019 11:42 AM Keene Breath wrote: Reason for CRM: Patient called to inform the nurse that his right ear lobe is itching and red for almost a week.  Patient is not sure where it is coming from.  CB#  [phone]

## 2019-07-26 NOTE — Telephone Encounter (Signed)
Spoke with patient and he will be scheduled for an appointment 07/27/2019.

## 2019-07-27 ENCOUNTER — Telehealth (INDEPENDENT_AMBULATORY_CARE_PROVIDER_SITE_OTHER): Payer: Medicare Other | Admitting: Family Medicine

## 2019-07-27 ENCOUNTER — Encounter: Payer: Self-pay | Admitting: Family Medicine

## 2019-07-27 ENCOUNTER — Other Ambulatory Visit: Payer: Self-pay

## 2019-07-27 DIAGNOSIS — I2510 Atherosclerotic heart disease of native coronary artery without angina pectoris: Secondary | ICD-10-CM | POA: Diagnosis not present

## 2019-07-27 DIAGNOSIS — L299 Pruritus, unspecified: Secondary | ICD-10-CM

## 2019-07-27 MED ORDER — TRIAMCINOLONE ACETONIDE 0.1 % EX OINT: 1.0000 "application " | TOPICAL_OINTMENT | Freq: Two times a day (BID) | CUTANEOUS | 1 refills | Status: DC

## 2019-07-27 NOTE — Progress Notes (Signed)
Virtual Visit via Video   I connected with patient on 07/27/19 at 10:00 AM EST by a video enabled telemedicine application and verified that I am speaking with the correct person using two identifiers.  Location patient: Home Location provider: Acupuncturist, Office Persons participating in the virtual visit: Patient, Provider, [place] (Jess B)  I discussed the limitations of evaluation and management by telemedicine and the availability of in person appointments. The patient expressed understanding and agreed to proceed.  Subjective:   HPI:   Itchy earlobe- R earlobe, sxs started 'over a week ago'.  'it feels like little bugs are running around in it'.  Has applied Lidocaine and Cortizone and Vasoline w/o relief.  Hurts just inside opening of EAC.  No fevers.  No drainage.  No recent swimming.  No evidence of rash or bug bite.  When he inserts little finger into ear, 'it's a little bit crusty'.  No changes to soaps or laundry detergent.  ROS:   See pertinent positives and negatives per HPI.  Patient Active Problem List   Diagnosis Date Noted  . Malignant melanoma of left side of neck ([place]) 10/25/2018  . Facial neuritis 10/25/2018  . Myofascial pain syndrome 10/25/2018  . Occipital neuralgia of left side 10/25/2018  . CAP (community acquired pneumonia) 09/08/2017  . Viral URI with cough 08/02/2017  . Male hypogonadism 06/20/2016  . Renal lesion 06/20/2016  . Insomnia 08/07/2015  . Enlarged prostate with lower urinary tract symptoms (LUTS) 11/07/2014  . Balanitis 07/30/2012  . Bladder neck obstruction 07/30/2012  . Prostate nodule 06/18/2012  . Recurrent nephrolithiasis 06/18/2012  . Urinary urgency 06/18/2012  . Benign neoplasm of colon 08/29/2011  . DEPRESSION, SITUATIONAL, ACUTE 01/23/2010  . Essential hypertension 05/30/2009  . Coronary atherosclerosis 05/30/2009  . Obstructive sleep apnea 11/28/2008  . OBESITY 09/23/2008  . ERECTILE DYSFUNCTION 11/26/2007  .  Well controlled type 2 diabetes mellitus with peripheral circulatory disorder ([place]) 05/09/2007  . Hyperlipidemia 05/09/2007  . MYOCARDIAL INFARCTION, HX OF 05/09/2007  . DIVERTICULOSIS, COLON 05/09/2007    Social History   Tobacco Use  . Smoking status: Former Smoker    Packs/day: 1.50    Years: 30.00    Pack years: 45.00    Types: Cigarettes    Quit date: 09/19/1980    Years since quitting: 38.8  . Smokeless tobacco: Never Used  Substance Use Topics  . Alcohol use: Yes    Alcohol/week: 7.0 - 14.0 standard drinks    Types: 7 - 14 Shots of liquor per week    Current Outpatient Medications:  .  aspirin 81 MG tablet, Take 81 mg by mouth daily., Disp: , Rfl:  .  empagliflozin (JARDIANCE) 25 MG TABS tablet, Take 25 mg by mouth daily., Disp: , Rfl:  .  fenofibrate 160 MG tablet, Take 160 mg by mouth daily., Disp: , Rfl:  .  fluticasone (FLONASE) 50 MCG/ACT nasal spray, Place 2 sprays into both nostrils daily., Disp: 48 g, Rfl: 3 .  Icosapent Ethyl (VASCEPA) 1 g CAPS, Take 2 capsules (2 g total) by mouth 2 (two) times daily., Disp: 120 capsule, Rfl: 3 .  insulin aspart (NOVOLOG FLEXPEN) 100 UNIT/ML FlexPen, Inject 40 Units 3 (three) times daily with meals into the skin., Disp: , Rfl:  .  insulin glargine (LANTUS) 100 unit/mL SOPN, Inject 50 Units into the skin at bedtime., Disp: , Rfl:  .  LORazepam (ATIVAN) 1 MG tablet, TAKE 1 TABLET BY MOUTH TWICE A DAY AS NEEDED FOR  ANXIETY, Disp: 180 tablet, Rfl: 0 .  metFORMIN (GLUCOPHAGE) 1000 MG tablet, Take 1,000 mg by mouth 2 (two) times daily with a meal., Disp: , Rfl:  .  Multiple Vitamins-Minerals (CENTRUM SILVER PO), 1 tab po qd , Disp: , Rfl:  .  rosuvastatin (CRESTOR) 40 MG tablet, Take 1 tablet (40 mg total) by mouth daily., Disp: , Rfl:  .  sertraline (ZOLOFT) 100 MG tablet, Take 150 mg by mouth daily. Total of 150mg  daily, Disp: , Rfl:  .  vitamin B-12 (CYANOCOBALAMIN) 1000 MCG tablet, Take 1,000 mcg by mouth daily.  , Disp: , Rfl:  .   zolpidem (AMBIEN) 10 MG tablet, Take 10 mg by mouth at bedtime as needed. For insomnia, Disp: , Rfl:  .  lisinopril (ZESTRIL) 10 MG tablet, Take 1 tablet (10 mg total) by mouth daily., Disp: 90 tablet, Rfl: 3  Allergies  Allergen Reactions  . Penicillins Hives and Rash    Objective:   There were no vitals taken for this visit. AAOx3, NAD NCAT, EOMI No obvious CN deficits R external ear mildly red, no rash visible Coloring WNL Pt is able to speak clearly, coherently without shortness of breath or increased work of breathing.  Thought process is linear.  Mood is appropriate.   Assessment and Plan:   Ear itching- new.  No obvious rash or infection.  Encouraged pt to start OTC antihistamine and will add topical triamcinolone ointment.  Pt expressed understanding and is in agreement w/ plan.    Annye Asa, MD 07/27/2019

## 2019-08-09 ENCOUNTER — Other Ambulatory Visit: Payer: Self-pay

## 2019-08-09 ENCOUNTER — Ambulatory Visit (INDEPENDENT_AMBULATORY_CARE_PROVIDER_SITE_OTHER): Payer: Medicare Other | Admitting: Internal Medicine

## 2019-08-09 ENCOUNTER — Encounter: Payer: Self-pay | Admitting: Internal Medicine

## 2019-08-09 DIAGNOSIS — C434 Malignant melanoma of scalp and neck: Secondary | ICD-10-CM

## 2019-08-09 DIAGNOSIS — I2510 Atherosclerotic heart disease of native coronary artery without angina pectoris: Secondary | ICD-10-CM | POA: Diagnosis not present

## 2019-08-09 DIAGNOSIS — G4733 Obstructive sleep apnea (adult) (pediatric): Secondary | ICD-10-CM | POA: Diagnosis not present

## 2019-08-09 NOTE — Progress Notes (Signed)
HPI male former smoker followed for OSA, insomnia, complicated by CAD/MI/stent, DM2 NPSG 2008:  AHI 7/hr.  --------------------------------------------------------------------   08/08/2018-  73 year old male former smoker followed for OSA, insomnia, complicated by CAD/MI/stent, DM2, malignant melanoma, CPAP  Auto 10-20/ [facility] -----OSA: DME: [facility] Pt states he wears CPAP nightly and did not bring SD card or machine today. No new supplies needed at this time.  Download compliance 80% compliance AHI 0.5/hour He says he could not sleep without CPAP now. Breathing is fine except for a light chronic cough.  Uses Flonase every night.  08/09/2019- 73 year old male former smoker followed for OSA, insomnia, complicated by CAD/MI/stent, DM2, malignant melanoma, HBP, CPAP  Auto 10-20/ [facility] S9 AutoSet machine) Download compliance - 60%, AHI 0.8/ hr Adjustable bed. Admits he has been slack with CPAP, falling asleep sometimes before he puts it on.  Otherwise no new health issues- no relapse of melanoma CXR 08/08/18- 1. No acute cardiopulmonary disease. 2.  Aortic Atherosclerosis (ICD10-I70.0).   ROS-see HPI   + = positive Constitutional:    weight loss, night sweats, fevers, chills, + fatigue, lassitude. HEENT:    headaches, difficulty swallowing, tooth/dental problems, sore throat,       sneezing, itching, ear ache, +nasal congestion, post nasal drip, snoring CV:    chest pain, orthopnea, PND, swelling in lower extremities, anasarca,                                                      dizziness, palpitations Resp:   + Shortness of breath with exertion or at rest.               Aside productive cough,   +non-productive cough, + coughing up of blood.               Change in color of mucus.  wheezing.   Skin:    rash or lesions. GI:  No-   heartburn, indigestion, abdominal pain, nausea, vomiting,  GU:  MS:   joint pain, stiffness,  Neuro-     nothing unusual Psych:  change in mood or  affect.  depression or anxiety.   memory loss.  OBJ- Physical Exam General- Alert, Oriented, Affect-appropriate, Distress- none acute Skin- rash-none, lesions- none, excoriation- none Lymphadenopathy- none Head- --             Eyes- Gross vision intact, PERRLA, conjunctivae and secretions clear            Ears- Hearing, canals-normal            Nose- Clear, no-Septal dev, mucus, polyps, erosion, perforation             Throat- Mallampati II-III , mucosa clear , drainage- none, tonsils- atrophic Neck- flexible , trachea midline, no stridor , thyroid nl, carotid no bruit Chest - symmetrical excursion , unlabored           Heart/CV- RRR , no murmur , no gallop  , no rub, nl s1 s2                           - JVD- none , edema- none, stasis changes- none, varices- none           Lung-   clear, wheeze- none,  cough -none, dullness-none, rub- none           Chest wall-  Abd-  Br/ Gen/ Rectal- Not done, not indicated Extrem- cyanosis- none, clubbing, none, atrophy- none, strength- nl Neuro- grossly intact to observation

## 2019-08-09 NOTE — Patient Instructions (Signed)
Keep using your CPAP 17, all night, every night. It is important to protect your brain and heart from drops in oxygen level while you sleep.  Please call us if we can help. You can contact [place] for support and replacement of supplies as needed.

## 2019-08-10 ENCOUNTER — Encounter: Payer: Self-pay | Admitting: Internal Medicine

## 2019-08-10 NOTE — Assessment & Plan Note (Signed)
No recurrence so far. Managed by Oncology/ Derm

## 2019-08-10 NOTE — Assessment & Plan Note (Signed)
He benefits when used, but getting lax. Education done.  Plan- continue CPAP auto 10-20

## 2019-08-22 ENCOUNTER — Other Ambulatory Visit: Payer: Self-pay

## 2019-08-23 ENCOUNTER — Encounter: Payer: Self-pay | Admitting: Internal Medicine

## 2019-08-23 ENCOUNTER — Ambulatory Visit (INDEPENDENT_AMBULATORY_CARE_PROVIDER_SITE_OTHER): Payer: Medicare Other | Admitting: Internal Medicine

## 2019-08-23 VITALS — BP 130/64 | HR 71 | Temp 97.0°F | Wt 221.5 lb

## 2019-08-23 DIAGNOSIS — E782 Mixed hyperlipidemia: Secondary | ICD-10-CM | POA: Diagnosis not present

## 2019-08-23 DIAGNOSIS — E1151 Type 2 diabetes mellitus with diabetic peripheral angiopathy without gangrene: Secondary | ICD-10-CM | POA: Diagnosis not present

## 2019-08-23 DIAGNOSIS — Z6830 Body mass index (BMI) 30.0-30.9, adult: Secondary | ICD-10-CM

## 2019-08-23 DIAGNOSIS — E6609 Other obesity due to excess calories: Secondary | ICD-10-CM

## 2019-08-23 DIAGNOSIS — I10 Essential (primary) hypertension: Secondary | ICD-10-CM | POA: Diagnosis not present

## 2019-08-23 DIAGNOSIS — I2510 Atherosclerotic heart disease of native coronary artery without angina pectoris: Secondary | ICD-10-CM

## 2019-08-23 LAB — POCT GLYCOSYLATED HEMOGLOBIN (HGB A1C): Hemoglobin A1C: 6.4 % — AB (ref 4.0–5.6)

## 2019-08-23 MED ORDER — LISINOPRIL 20 MG PO TABS: 20.0000 mg | ORAL_TABLET | Freq: Every day | ORAL | 1 refills | Status: DC

## 2019-08-23 NOTE — Progress Notes (Signed)
Established Patient Office Visit     This visit occurred during the SARS-CoV-2 public health emergency.  Safety protocols were in place, including screening questions prior to the visit, additional usage of staff PPE, and extensive cleaning of exam room while observing appropriate contact time as indicated for disinfecting solutions.    CC/Reason for Visit: Follow-up of chronic medical conditions  HPI: Brent [name]. is a 73 y.o. male who is coming in today for the above mentioned reasons. Past Medical History is significant for: Insulin-dependent diabetes that has been well controlled recently, uncontrolled hypertension, hyperlipidemia, coronary artery disease, anxiety and depression.  He is mainly here today for diabetic follow-up.  He has been having hypoglycemic episodes in the morning which prompted [place] to decrease his bedtime Lantus from 60 to 50 units about 6 months ago.  Since then he has been doing well, CBGs have been ranging between 99 and 205 with an average of around 150-160.  He has had no further hypoglycemic episodes.  He states that since he last saw me he saw cardiology who increased his lisinopril from 10 to 20 mg.  He submitted the prescription to the [place] and has not yet had it refilled.  He has been doubling up on his 10 mg of lisinopril and is now running out.  He is wondering if I can give him a month's prescription until the [place] refills kick in.   Past Medical/Surgical History: Past Medical History:  Diagnosis Date  . Allergy   . CAD (coronary artery disease)    a. angioplasty of his RCA in 1990. b. bare metal stent placed in the RCA in 2000 followed by rotational atherectomy shortly after for stent restenosis. c. last cath was in 2012 showing stable moderate diffuse CAD. (70% mid LAD, 80% diagonal, 70% Ramus, 40% mid to distal RCA stent restenosis). d. Low risk nuc in 2015.  . Diabetes mellitus   . Diverticulosis   . Elevated CK   . Erectile dysfunction   .  Hemorrhoids   . HTN (hypertension)   . Hyperlipidemia   . Hypertriglyceridemia   . Malignant melanoma of left side of neck ([place]) 10/25/2018  . Myocardial infarction ([place])   . Obesity   . OSA (obstructive sleep apnea)   . Persistent disorder of initiating or maintaining sleep   . Personal history of colonic polyps 02/05/2003    Past Surgical History:  Procedure Laterality Date  . CHOLECYSTECTOMY    . CORONARY ANGIOPLASTY    . CORONARY STENT PLACEMENT     stenting of the right coronary artery with followup rotational  atherectomy. (3 stents placed)  . FINGER SURGERY     right  . FOOT SURGERY     right  . INGUINAL HERNIA REPAIR     right  . melanoma removal     neck  . ORTHOPEDIC SURGERY     foot right  . rotator cuff surg     [place]il    Social History:  reports that he quit smoking about 38 years ago. His smoking use included cigarettes. He has a 45.00 pack-year smoking history. He has never used smokeless tobacco. He reports current alcohol use of about 7.0 - 14.0 standard drinks of alcohol per week. He reports that he does not use drugs.  Allergies: Allergies  Allergen Reactions  . Penicillins Hives and Rash    Family History:  Family History  Problem Relation Age of Onset  . Liver cancer Father   .  Lung cancer Father   . Heart disease Father   . Heart disease Sister   . Lung cancer Mother   . COPD Mother   . Obesity Mother   . Colon cancer Neg Hx   . Stomach cancer Neg Hx   . Pancreatic cancer Neg Hx      Current Outpatient Medications:  .  aspirin 81 MG tablet, Take 81 mg by mouth daily., Disp: , Rfl:  .  fenofibrate 160 MG tablet, Take 160 mg by mouth daily., Disp: , Rfl:  .  fluticasone (FLONASE) 50 MCG/ACT nasal spray, Place 2 sprays into both nostrils daily., Disp: 48 g, Rfl: 3 .  Icosapent Ethyl (VASCEPA) 1 g CAPS, Take 2 capsules (2 g total) by mouth 2 (two) times daily., Disp: 120 capsule, Rfl: 3 .  insulin aspart (NOVOLOG FLEXPEN) 100 UNIT/ML FlexPen,  Inject 40 Units 3 (three) times daily with meals into the skin., Disp: , Rfl:  .  insulin glargine (LANTUS) 100 unit/mL SOPN, Inject 50 Units into the skin at bedtime., Disp: , Rfl:  .  LORazepam (ATIVAN) 1 MG tablet, TAKE 1 TABLET BY MOUTH TWICE A DAY AS NEEDED FOR ANXIETY, Disp: 180 tablet, Rfl: 0 .  metFORMIN (GLUCOPHAGE) 1000 MG tablet, Take 1,000 mg by mouth 2 (two) times daily with a meal., Disp: , Rfl:  .  Multiple Vitamins-Minerals (CENTRUM SILVER PO), 1 tab po qd , Disp: , Rfl:  .  rosuvastatin (CRESTOR) 40 MG tablet, Take 1 tablet (40 mg total) by mouth daily., Disp: , Rfl:  .  sertraline (ZOLOFT) 100 MG tablet, Take 150 mg by mouth daily. Total of 150mg  daily, Disp: , Rfl:  .  triamcinolone ointment (KENALOG) 0.1 %, Apply 1 application topically 2 (two) times daily., Disp: 80 g, Rfl: 1 .  vitamin B-12 (CYANOCOBALAMIN) 1000 MCG tablet, Take 1,000 mcg by mouth daily.  , Disp: , Rfl:  .  zolpidem (AMBIEN) 10 MG tablet, Take 10 mg by mouth at bedtime as needed. For insomnia, Disp: , Rfl:  .  lisinopril (ZESTRIL) 20 MG tablet, Take 1 tablet (20 mg total) by mouth daily., Disp: 90 tablet, Rfl: 1  Review of Systems:  Constitutional: Denies fever, chills, diaphoresis, appetite change and fatigue.  HEENT: Denies photophobia, eye pain, redness, hearing loss, ear pain, congestion, sore throat, rhinorrhea, sneezing, mouth sores, trouble swallowing, neck pain, neck stiffness and tinnitus.   Respiratory: Denies SOB, DOE, cough, chest tightness,  and wheezing.   Cardiovascular: Denies chest pain, palpitations and leg swelling.  Gastrointestinal: Denies nausea, vomiting, abdominal pain, diarrhea, constipation, blood in stool and abdominal distention.  Genitourinary: Denies dysuria, urgency, frequency, hematuria, flank pain and difficulty urinating.  Endocrine: Denies: hot or cold intolerance, sweats, changes in hair or nails, polyuria, polydipsia. Musculoskeletal: Denies myalgias, back pain, joint  swelling, arthralgias and gait problem.  Skin: Denies pallor, rash and wound.  Neurological: Denies dizziness, seizures, syncope, weakness, light-headedness, numbness and headaches.  Hematological: Denies adenopathy. Easy bruising, personal or family bleeding history  Psychiatric/Behavioral: Denies suicidal ideation, mood changes, confusion, nervousness, sleep disturbance and agitation    Physical Exam: Vitals:   08/23/19 0921  BP: 130/64  Pulse: 71  Temp: (!) 97 F (36.1 C)  TempSrc: Temporal  SpO2: 96%  Weight: 221 lb 8 oz (100.5 kg)    Body mass index is 30.04 kg/m.   Constitutional: NAD, calm, comfortable Eyes: PERRL, lids and conjunctivae normal, wears corrective lenses ENMT: Mucous membranes are moist.  Respiratory: clear to auscultation  bilaterally, no wheezing, no crackles. Normal respiratory effort. No accessory muscle use.  Cardiovascular: Regular rate and rhythm, no murmurs / rubs / gallops. No extremity edema. 2+ pedal pulses.  Abdomen: no tenderness, no masses palpated. No hepatosplenomegaly. Bowel sounds positive.  Musculoskeletal: no clubbing / cyanosis. No joint deformity upper and lower extremities. Good ROM, no contractures. Normal muscle tone.  Neurologic: Grossly intact and nonfocal Psychiatric: Normal judgment and insight. Alert and oriented x 3. Normal mood.    Impression and Plan:  Well controlled type 2 diabetes mellitus with peripheral circulatory disorder ([place])  -A1c today demonstrates good control at 6.4. -Plan to continue Lantus 50 units at bedtime, NovoLog sliding scale of which he takes on average between 25 and 30 minutes with each meal, Metformin 1000 mg twice daily. -He also has Jardiance on his medication list but he states he has not been taking this, will discontinue.  Mixed hyperlipidemia -Last LDL was 61 in July 2020, continue Crestor.  Class 1 obesity due to excess calories with serious comorbidity and body mass index (BMI) of 30.0  to 30.9 in adult -Discussed healthy lifestyle, including increased physical activity and better food choices to promote weight loss.  Essential hypertension  -Well-controlled. -Refill lisinopril.    Patient Instructions  -Nice seeing you today!!  -Diabetes remains well controlled.  -Lisinopril refill has been sent.  -Schedule follow up in 3 months.     Lelon Frohlich, MD  Primary Care at [facility]

## 2019-08-23 NOTE — Patient Instructions (Addendum)
-  Nice seeing you today!!  -Diabetes remains well controlled.  -Lisinopril refill has been sent.  -Schedule follow up in 3 months.

## 2019-08-30 DIAGNOSIS — N281 Cyst of kidney, acquired: Secondary | ICD-10-CM | POA: Diagnosis not present

## 2019-10-09 DIAGNOSIS — C434 Malignant melanoma of scalp and neck: Secondary | ICD-10-CM | POA: Diagnosis not present

## 2019-10-14 ENCOUNTER — Telehealth: Payer: Self-pay

## 2019-10-14 DIAGNOSIS — E782 Mixed hyperlipidemia: Secondary | ICD-10-CM

## 2019-10-14 NOTE — Telephone Encounter (Signed)
[facility] for lipid labs. Orders placed for labs

## 2019-10-21 ENCOUNTER — Other Ambulatory Visit: Payer: Self-pay

## 2019-10-21 ENCOUNTER — Other Ambulatory Visit: Payer: Medicare Other | Admitting: *Deleted

## 2019-10-21 DIAGNOSIS — E782 Mixed hyperlipidemia: Secondary | ICD-10-CM | POA: Diagnosis not present

## 2019-10-21 LAB — LIPID PANEL
Chol/HDL Ratio: 5 ratio (ref 0.0–5.0)
Cholesterol, Total: 139 mg/dL (ref 100–199)
HDL: 28 mg/dL — ABNORMAL LOW (ref >39)
LDL Chol Calc (NIH): 66 mg/dL (ref 0–99)
Triglycerides: 282 mg/dL — ABNORMAL HIGH (ref 0–149)
VLDL Cholesterol Cal: 45 mg/dL — ABNORMAL HIGH (ref 5–40)

## 2019-10-22 ENCOUNTER — Telehealth: Payer: Self-pay | Admitting: Pharmacist

## 2019-10-22 MED ORDER — FENOFIBRATE 200 MG PO CAPS: 200.0000 mg | ORAL_CAPSULE | Freq: Every day | ORAL | 3 refills | Status: DC

## 2019-10-22 NOTE — Telephone Encounter (Signed)
Spoke with patient about his lipid panel results. TG improved, but still elevated. Patient is taking Vascepa 2g BID, Crestor 40mg  and fenofibrate 160mg . States he tries to watch his cabs/sugars. His  blood sugar has been a little erratic lately. Discussed increasing fenofibrate to 200mg  daily. Patient is in agreement. Reviewed the positive effects on TG and microvascular complications. Will print Rx for patient to pick up and get filled at the [place]. Repeat labs in 3 months

## 2019-10-29 ENCOUNTER — Encounter (INDEPENDENT_AMBULATORY_CARE_PROVIDER_SITE_OTHER): Payer: Self-pay

## 2019-11-06 ENCOUNTER — Ambulatory Visit (INDEPENDENT_AMBULATORY_CARE_PROVIDER_SITE_OTHER): Payer: Medicare Other | Admitting: Family Medicine

## 2019-11-06 ENCOUNTER — Ambulatory Visit (INDEPENDENT_AMBULATORY_CARE_PROVIDER_SITE_OTHER): Payer: Medicare Other | Admitting: Bariatrics

## 2019-11-08 ENCOUNTER — Telehealth: Payer: Self-pay

## 2019-11-08 NOTE — Telephone Encounter (Signed)
-----   Message from Ramond Dial, [facility] sent at 11/07/2019  8:20 AM EST ----- I would NOT recommend 1.5 tablets of fenofibrate. I would just ask patient to continue to restrict his carbs/sugars and alcohol intake. He should continue taking current medications including fenofibrate 160mg  daily.

## 2019-11-08 NOTE — Telephone Encounter (Signed)
Pt aware to continue restricting sugar/carbs and only take the recommended 160mg  of Fenofibrate as prescribed. Pt verbalized understanding and is aware to call back with any questions.

## 2019-11-13 ENCOUNTER — Other Ambulatory Visit: Payer: Self-pay | Admitting: Internal Medicine

## 2019-11-13 DIAGNOSIS — G4700 Insomnia, unspecified: Secondary | ICD-10-CM

## 2019-11-20 ENCOUNTER — Ambulatory Visit (INDEPENDENT_AMBULATORY_CARE_PROVIDER_SITE_OTHER): Payer: Medicare Other | Admitting: Bariatrics

## 2019-11-27 DIAGNOSIS — Z85820 Personal history of malignant melanoma of skin: Secondary | ICD-10-CM | POA: Diagnosis not present

## 2019-11-27 DIAGNOSIS — L821 Other seborrheic keratosis: Secondary | ICD-10-CM | POA: Diagnosis not present

## 2019-11-27 DIAGNOSIS — D692 Other nonthrombocytopenic purpura: Secondary | ICD-10-CM | POA: Diagnosis not present

## 2019-11-27 DIAGNOSIS — D485 Neoplasm of uncertain behavior of skin: Secondary | ICD-10-CM | POA: Diagnosis not present

## 2019-11-27 DIAGNOSIS — Z85828 Personal history of other malignant neoplasm of skin: Secondary | ICD-10-CM | POA: Diagnosis not present

## 2019-11-27 DIAGNOSIS — D2272 Melanocytic nevi of left lower limb, including hip: Secondary | ICD-10-CM | POA: Diagnosis not present

## 2019-11-27 DIAGNOSIS — L570 Actinic keratosis: Secondary | ICD-10-CM | POA: Diagnosis not present

## 2019-12-19 ENCOUNTER — Other Ambulatory Visit: Payer: Self-pay | Admitting: Internal Medicine

## 2019-12-19 ENCOUNTER — Telehealth: Payer: Self-pay | Admitting: Internal Medicine

## 2019-12-19 DIAGNOSIS — R05 Cough: Secondary | ICD-10-CM

## 2019-12-19 DIAGNOSIS — R059 Cough, unspecified: Secondary | ICD-10-CM

## 2019-12-19 MED ORDER — BENZONATATE 100 MG PO CAPS: 100.0000 mg | ORAL_CAPSULE | Freq: Two times a day (BID) | ORAL | 0 refills | Status: DC | PRN

## 2019-12-19 NOTE — Telephone Encounter (Signed)
Pt states he gets the same cold twice a year and he gets a persistant cough. He is unable to sleep because of it. He states that he has taken a prescription cough syrup previously for it and is wondering if his PCP will call some in?    Pt did not feel an appt was needed  Pharmacy: [facility]: [phone]

## 2019-12-19 NOTE — Telephone Encounter (Signed)
Patient is aware 

## 2019-12-19 NOTE — Telephone Encounter (Signed)
Will send is tessalon perles to take BID PRN cough

## 2019-12-19 NOTE — Telephone Encounter (Signed)
Sent Rx for tessalon perles to take BID PRN cough. Needs OV if worsens.

## 2019-12-20 ENCOUNTER — Other Ambulatory Visit: Payer: Self-pay | Admitting: Internal Medicine

## 2020-01-16 ENCOUNTER — Telehealth: Payer: Self-pay | Admitting: Pharmacist

## 2020-01-16 DIAGNOSIS — E782 Mixed hyperlipidemia: Secondary | ICD-10-CM

## 2020-01-16 NOTE — Telephone Encounter (Signed)
Called patient to set up repeat lipid labs Left message for patient to call back

## 2020-01-17 NOTE — Telephone Encounter (Signed)
Pt returned call to clinic. Scheduled fasting labs on 5/3.

## 2020-01-17 NOTE — Addendum Note (Signed)
Addended by: Hridaan Bouse E on: 01/17/2020 11:01 AM   Modules accepted: Orders

## 2020-01-20 ENCOUNTER — Other Ambulatory Visit: Payer: Medicare Other | Admitting: *Deleted

## 2020-01-20 ENCOUNTER — Other Ambulatory Visit: Payer: Self-pay

## 2020-01-20 DIAGNOSIS — E782 Mixed hyperlipidemia: Secondary | ICD-10-CM

## 2020-01-20 LAB — LIPID PANEL
Chol/HDL Ratio: 5.9 ratio — ABNORMAL HIGH (ref 0.0–5.0)
Cholesterol, Total: 135 mg/dL (ref 100–199)
HDL: 23 mg/dL — ABNORMAL LOW (ref >39)
LDL Chol Calc (NIH): 65 mg/dL (ref 0–99)
Triglycerides: 295 mg/dL — ABNORMAL HIGH (ref 0–149)
VLDL Cholesterol Cal: 47 mg/dL — ABNORMAL HIGH (ref 5–40)

## 2020-01-20 LAB — LDL CHOLESTEROL, DIRECT: LDL Direct: 68 mg/dL (ref 0–99)

## 2020-01-20 LAB — HEPATIC FUNCTION PANEL
ALT: 22 IU/L (ref 0–44)
AST: 29 IU/L (ref 0–40)
Albumin: 4.6 g/dL (ref 3.7–4.7)
Alkaline Phosphatase: 94 IU/L (ref 39–117)
Bilirubin Total: 0.3 mg/dL (ref 0.0–1.2)
Bilirubin, Direct: 0.12 mg/dL (ref 0.00–0.40)
Total Protein: 6.6 g/dL (ref 6.0–8.5)

## 2020-01-24 ENCOUNTER — Other Ambulatory Visit: Payer: Self-pay | Admitting: Pharmacist

## 2020-02-04 ENCOUNTER — Telehealth: Payer: Self-pay | Admitting: Internal Medicine

## 2020-02-04 DIAGNOSIS — I10 Essential (primary) hypertension: Secondary | ICD-10-CM

## 2020-02-04 DIAGNOSIS — E782 Mixed hyperlipidemia: Secondary | ICD-10-CM

## 2020-02-04 DIAGNOSIS — I2510 Atherosclerotic heart disease of native coronary artery without angina pectoris: Secondary | ICD-10-CM

## 2020-02-04 DIAGNOSIS — E1151 Type 2 diabetes mellitus with diabetic peripheral angiopathy without gangrene: Secondary | ICD-10-CM

## 2020-02-04 NOTE — Chronic Care Management (AMB) (Signed)
  Chronic Care Management   Note  02/04/2020 Name: Brent [name]. MRN: [id number] DOB: 01-03-1946  Brent [name]. is a 74 y.o. year old male who is a primary care patient of [name], [name], MD. I reached out to Daine Gip. by phone today in response to a referral sent by [name]'s PCP, [name], [name], MD.   Brent [name] was given information about Chronic Care Management services today including:  1. CCM service includes personalized support from designated clinical staff supervised by his physician, including individualized plan of care and coordination with other care providers 2. 24/7 contact phone numbers for assistance for urgent and routine care needs. 3. Service will only be billed when office clinical staff spend 20 minutes or more in a month to coordinate care. 4. Only one practitioner may furnish and bill the service in a calendar month. 5. The patient may stop CCM services at any time (effective at the end of the month) by phone call to the office staff.   Patient agreed to services and verbal consent obtained.   Follow up plan:   [place]

## 2020-03-04 NOTE — Addendum Note (Signed)
Addended by: Westley Hummer B on: 03/04/2020 09:58 AM   Modules accepted: Orders

## 2020-03-05 NOTE — Chronic Care Management (AMB) (Signed)
Chronic Care Management Pharmacy  Name: Brent [name].  MRN: [id number] DOB: 05-05-1946  Initial Questions: 1. Have you seen any other providers since your last visit? NA 2. Any changes in your medicines or health? No   Chief Complaint/ HPI  Brent [name].,  74 y.o. , male presents for their Initial CCM visit with the clinical pharmacist via telephone due to COVID-19 Pandemic.  PCP : [name], [name], MD  Their chronic conditions include: HTN, DM, HLD, History of MI, coronary atherosclerosis, LUTS, insomnia, depression, allergic rhinits   Office Visits: 08/23/2019- Lelon Frohlich, MD- patient presented for office visit for follow up. A1c: 6.4. Patient to continue Lantus 50 units at bedtime, Novolog sliding scale, and Metformin 1056m twice daily. Patient to continue all other medication. Follow up in 3 months.   Consult Visit: 10/09/2019- Surgical Oncology- DBill Salinas MD- Patient presented for office visit for follow up. 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 Patient reported no new complaints. Patient to alternate visits between dermatology and return in 6 months.   09/02/2019- Urology- RNelma Rothman MD- Patient presented for office visit for recurrent renal cyst. Patient to monitor ulcer and call back for biopsy. Renal UKorealooked good. Patient to return in a year for renal [place]and OV. [facility]   08/09/2019- Pulmonology- CBaird Lyons MD- Patient presented for office visit for OSA, insomnia. Patient reports being slack with CPAP. Patient instructed to use CPAP 17 all night, every night. Patient to return in 1 year.   Medications: Outpatient Encounter Medications as of 03/06/2020  Medication Sig  . aspirin 81 MG tablet Take 81 mg by mouth daily.  . empagliflozin (JARDIANCE) 25 MG TABS tablet Take 12.5 mg by mouth daily.  . fenofibrate 160 MG  tablet Take 160 mg by mouth daily.  . fluticasone (FLONASE) 50 MCG/ACT nasal spray SPRAY 2 SPRAYS INTO EACH NOSTRIL EVERY DAY  . Icosapent Ethyl (VASCEPA) 1 g CAPS Take 2 capsules (2 g total) by mouth 2 (two) times daily.  . insulin aspart (NOVOLOG FLEXPEN) 100 UNIT/ML FlexPen Inject 20 Units into the skin 3 (three) times daily with meals.   . insulin glargine (LANTUS) 100 unit/mL SOPN Inject 60 Units into the skin at bedtime.   .Marland Kitchenlisinopril (ZESTRIL) 20 MG tablet Take 1 tablet (20 mg total) by mouth daily.  .Marland KitchenLORazepam (ATIVAN) 1 MG tablet TAKE 1 TABLET BY MOUTH TWICE A DAY AS NEEDED FOR ANXIETY  . metFORMIN (GLUCOPHAGE) 1000 MG tablet Take 1,000 mg by mouth 2 (two) times daily with a meal.  . Multiple Vitamins-Minerals (CENTRUM SILVER PO) 1 tab po qd   . rosuvastatin (CRESTOR) 40 MG tablet Take 1 tablet (40 mg total) by mouth daily.  . sertraline (ZOLOFT) 100 MG tablet Take 150 mg by mouth daily. Total of 1547mdaily  . triamcinolone ointment (KENALOG) 0.1 % Apply 1 application topically 2 (two) times daily.  . vitamin B-12 (CYANOCOBALAMIN) 1000 MCG tablet Take 1,000 mcg by mouth daily.    . Marland Kitchenolpidem (AMBIEN) 10 MG tablet Take 10 mg by mouth at bedtime as needed. For insomnia  . benzonatate (TESSALON) 100 MG capsule Take 1 capsule (100 mg total) by mouth 2 (two) times daily as needed for cough. (Patient not taking: Reported on 03/06/2020)   No facility-administered encounter medications on file as of 03/06/2020.  Current Diagnosis/Assessment:  Goals Addressed            This Visit's Progress   . Pharmacy Care Plan       CARE PLAN ENTRY (see longitudinal plan of care for additional care plan information)  Current Barriers:  . Chronic Disease Management support, education, and care coordination needs related to Hypertension, Hyperlipidemia, Diabetes, Coronary Artery Disease, and history of myocardial infarction, lower urinary tract symptoms   Hypertension BP Readings from Last 3  Encounters:  08/23/19 130/64  08/09/19 122/62  05/24/19 120/70   . Pharmacist Clinical Goal(s): o Over the next 180 days, patient will work with PharmD and providers to maintain BP goal <130/80 . Current regimen:  o Lisinopril 9m,1 tablet once daily (taking primary for kidney protection)  . Patient self care activities - Over the next 180 days, patient will: o Check BP as instructed, document, and provide at future appointments o Ensure daily salt intake < 2300 mg/day  Hyperlipidemia Lab Results  Component Value Date/Time   LDLCALC 65 01/20/2020 07:54 AM   LDLDIRECT 68 01/20/2020 07:54 AM   LDLDIRECT 77.0 04/15/2016 11:33 AM   . Pharmacist Clinical Goal(s): o Over the next 180 days, patient will work with PharmD and providers to maintain LDL goal < 70 . Current regimen:   Fenofibrate 1661m 1 tablet once daily  Vascepa 1g, 2 capsules twice daily  Rosuvastatin 4049m1 tablet once daily  . Interventions: o We discussed:  diet and exercise extensively . We discussed how a diet high in plant sterols (fruits/vegetables/nuts/whole grains/legumes) may reduce your cholesterol.  Encouraged increasing fiber to a daily intake of 10-25g/day  . Patient self care activities - Over the next 180 days, patient will: o Continue current medication and work on lifestyle modifications (exercise and diet).   Diabetes Lab Results  Component Value Date/Time   HGBA1C 6.4 (A) 08/23/2019 09:30 AM   HGBA1C 6.4 (A) 05/24/2019 09:03 AM   HGBA1C 7.4 (H) 04/15/2016 11:33 AM   HGBA1C 6.4 03/18/2015 08:33 AM   . Pharmacist Clinical Goal(s): o Over the next 180 days, patient will work with PharmD and providers to maintain A1c goal <7% . Current regimen:   Insulin aspart (Novolog Flexpen), inject 20 to 30 units three times daily with meals  Insulin glargine (Lantus), inject 60 units at bedtime   Metformin 1000m80m tablet twice daily with a meal   Jardiance 12.5mg,72mtablet once daily (also for  heart protection) . Interventions: o We discussed:  how to recognize and treat signs of hypoglycemia   Benefits of continuous glucose monitor (ex. Freestyle Libre or Dexcom G6) in detecting hypoglycemia at night and decreasing need of finger pricking.  . Patient self care activities - Over the next 180 days, patient will: o Check blood sugar 3-4 times daily, document, and provide at future appointments o Contact provider with any episodes of hypoglycemia  History of MI/ coronary atherosclerosis  . Pharmacist Clinical Goal(s) o Over the next 180 days, patient will work with PharmD and providers to minimize heart events.  . Current regimen:  o Aspirin 81mg,37mablet once daily  . Patient self care activities - Over the next 180 days, patient will: o Continue current medications as instructed.   Lower urinary tract symptoms  . Pharmacist Clinical Goal(s) o Over the next 180 days, patient will work with PharmD and providers to minimize symptoms  . Current regimen:  o Oxybutynin 5mg, 155mblet once daily (taking on and off)  .  Patient self care activities o Patient will discuss at follow up with urologist.   Medication management . Pharmacist Clinical Goal(s): o Over the next 180 days, patient will work with PharmD and providers to maintain optimal medication adherence . Current pharmacy: [facility]  . Interventions o Comprehensive medication review performed. o Continue current medication management strategy . Patient self care activities - Over the next 180 days, patient will: o Take medications as prescribed o Report any questions or concerns to PharmD and/or provider(s)  Initial goal documentation        Diabetes    Patient reports having calluses and wants to get them taken care of by a podiatrist. He requested referral to podiatry since [facility] may take a while.  Patient endorses previous episodes of hypoglycemia when BG was 71. In the past, has gone down  to 50s, but states it has been "awhile" (unable to give time estimate). Patient reports drinking small amount of cranberry juice to help bring BGs up.   Recent Relevant Labs: Lab Results  Component Value Date/Time   HGBA1C 6.4 (A) 08/23/2019 09:30 AM   HGBA1C 6.4 (A) 05/24/2019 09:03 AM   HGBA1C 7.4 (H) 04/15/2016 11:33 AM   HGBA1C 6.4 03/18/2015 08:33 AM   MICROALBUR 4.2 (H) 03/18/2015 08:33 AM   MICROALBUR 1.7 01/09/2013 09:49 AM    Checking BG: 4x per day  Patient did not bring meter and could not recall BG number, but states overall his BGs have been doing well.   Recent FBG Readings: Not available Recent pre-meal BG readings: not available Recent 2hr PP BG readings:  Not available Recent HS BG readings:127   Patient has failed these meds in past: none  Patient is currently controlled on the following medications:   Insulin aspart (Novolog Flexpen), inject 20 to 30 units three times daily with meals  Insulin glargine (Lantus), inject 60 units at bedtime   Metformin 1097m, 1 tablet twice daily with a meal   Jardiance 12.531m 1 tablet once daily    Last diabetic Eye exam:  Lab Results  Component Value Date/Time   HMDIABEYEEXA No Retinopathy 03/28/2019 12:00 AM   - patient reports obtaining once a year   Last diabetic Foot exam: No results found for: HMDIABFOOTEX   We discussed:  how to recognize and treat signs of hypoglycemia   Benefits of CGM in detecting hypoglycemia at night   Plan Continue current medications  Request referral to podiatry.  [facility]   Hypertension  Patient reported not really having high blood pressure, but taking lisinopril for kidney protection)   Office blood pressures are  BP Readings from Last 3 Encounters:  08/23/19 130/64  08/09/19 122/62  05/24/19 120/70   Patient has failed these meds in the past: losartan   Patient checks BP at home daily  Patient home BP  readings are ranging: unable to provide readings (patient states doing well)    Patient is controlled on:   Lisinopril 2068m tablet once daily   Plan Continue current medications   Hyperlipidemia  Patient endorses having problems with elevated triglycerides almost all his life.   Diet- Patient reports his diet consists of mostly chicken: grilled, baked BBQ chicken, salads, vegetables. He states he takes care of his 11 31ar-old grandson so will make him hotdogs and he will eat that too. He endorses his diet can be better, and will aim to make small changes.  Exercise: patient reports being active and going to the gym. He reports feeling better when he was active. Now he states he does not have much time going to gym as he takes care of his grandson, but does remain active when taking care of grandson.   LDL goal < 70   Lipid Panel     Component Value Date/Time   CHOL 135 01/20/2020 0754   TRIG 295 (H) 01/20/2020 0754   HDL 23 (L) 01/20/2020 0754   LDLCALC 65 01/20/2020 0754   LDLDIRECT 68 01/20/2020 0754   LDLDIRECT 77.0 04/15/2016 1133    Hepatic Function Latest Ref Rng & Units 01/20/2020 07/17/2019 04/11/2019  Total Protein 6.0 - 8.5 g/dL 6.6 6.8 6.6  Albumin 3.7 - 4.7 g/dL 4.6 4.4 4.6  AST 0 - 40 IU/L _0 ALT 0 - 44 IU/L _1 Alk Phosphatase 39 - 117 IU/L 94 109 68  Total Bilirubin 0.0 - 1.2 mg/dL 0.3 0.3 0.3  Bilirubin, Direct 0.00 - 0.40 mg/dL 0.12 0.13 -     The ASCVD Risk score (Peters., et al., 2013) failed to calculate for the following reasons:   The patient has a prior MI or stroke diagnosis   Patient has failed these meds in past: none   Patient is currently controlled on the following medications:   Fenofibrate 133m, 1 tablet once daily  Vascepa 1g, 2 capsules twice daily  Rosuvastatin 436m 1 tablet once daily   We discussed:  diet and exercise extensively  Plan Continue current medications  Patient to obtain repeat labs in 6 months.   Managed by lipid clinic.   Hx of MI/ coronary atherosclerosis   Patient has failed these meds in past: none   Patient is currently controlled on the following medications:   Aspirin 8123m1 tablet once daily   Plan Managed by cardiologist (McAngelena FormContinue current medications  Allergic rhinitis    Patient is currently controlled on the following medications:  . Fluticasone (Flonase), 2 sprays into each nostril every day   Plan Continue current medications  LUTS  Patient reports taking oxybutynin on and off. Previously, he reports taking BID, but felt he was not going to the restroom at all. When he decreased to once daily, he felt that did not control his symptoms. Patient reports having a follow up visit with urologist coming up and will discuss with him.  Patient has failed these meds in past: none   Patient is currently uncontrolled on the following medications:  . Oxybutynin 5mg46m tablet once daily  Plan Managed by urology (Dr. DaviRosana Hoesatient to follow-up at next visit for further management.   Depression/ anxiety   PHQ9 SCORE ONLY 03/06/2020 12/14/2018 11/22/2018  PHQ-9 Total Score 0 4 1   Patient is currently controlled on the following medications:  . Lorazepam 1mg,1mtablet twice daily as needed for anxiety (patient reports takes mostly 1 tablet once daily) . Sertraline 100mg,48mmg o77mdaily   Plan Continue current medications  Insomnia   Patient reports insomnia is controlled with zolpidem. He reports, if he does not take it, he does not sleep. He denies  Patient reports using CPAP, but states that does not bother him as it did before and states full compliance.   Patient has failed these meds in past: none   Patient is currently controlled on the following medications:  . ZolpiMarland Kitchenem 10mg, 166mlet at bedtime as needed for insomnia  We discussed:    Cautioned use in older adults   Plan Continue current medications  OTC/ supplements   Patient  reports being told to take vitamin B12 since he was diagnosed with diabetes and placed on metformin.   Vitamin B-12  Date Value Ref Range Status  01/14/2016 607 211 - 911 pg/mL Final   Patient is currently on the following medications:  Marland Kitchen Vitamin B12 1074mg, 1 tablet once daily   Plan Continue current medications  Medication Management  Patient organizes medications: patient uses own strategy; pill box and separates by AM and PM  [facility]  Adherence: unable to determine- information not [facility] [place] [facility]    Follow up Follow up visit with PharmD in 6 months.   AAnson Crofts PharmD Clinical Pharmacist [facility]([phone]

## 2020-03-06 ENCOUNTER — Other Ambulatory Visit: Payer: Self-pay

## 2020-03-06 ENCOUNTER — Ambulatory Visit: Payer: Medicare Other

## 2020-03-06 DIAGNOSIS — I252 Old myocardial infarction: Secondary | ICD-10-CM

## 2020-03-06 DIAGNOSIS — I10 Essential (primary) hypertension: Secondary | ICD-10-CM

## 2020-03-06 DIAGNOSIS — I2510 Atherosclerotic heart disease of native coronary artery without angina pectoris: Secondary | ICD-10-CM

## 2020-03-06 DIAGNOSIS — N401 Enlarged prostate with lower urinary tract symptoms: Secondary | ICD-10-CM

## 2020-03-06 DIAGNOSIS — E1151 Type 2 diabetes mellitus with diabetic peripheral angiopathy without gangrene: Secondary | ICD-10-CM

## 2020-03-06 DIAGNOSIS — E782 Mixed hyperlipidemia: Secondary | ICD-10-CM

## 2020-03-06 NOTE — Patient Instructions (Addendum)
Visit Information  Goals Addressed            This Visit's Progress   . Pharmacy Care Plan       CARE PLAN ENTRY (see longitudinal plan of care for additional care plan information)  Current Barriers:  . Chronic Disease Management support, education, and care coordination needs related to Hypertension, Hyperlipidemia, Diabetes, Coronary Artery Disease, and history of myocardial infarction, lower urinary tract symptoms   Hypertension BP Readings from Last 3 Encounters:  08/23/19 130/64  08/09/19 122/62  05/24/19 120/70   . Pharmacist Clinical Goal(s): o Over the next 180 days, patient will work with PharmD and providers to maintain BP goal <130/80 . Current regimen:  o Lisinopril 20mg ,1 tablet once daily (taking primary for kidney protection)  . Patient self care activities - Over the next 180 days, patient will: o Check BP as instructed, document, and provide at future appointments o Ensure daily salt intake < 2300 mg/day  Hyperlipidemia Lab Results  Component Value Date/Time   LDLCALC 65 01/20/2020 07:54 AM   LDLDIRECT 68 01/20/2020 07:54 AM   LDLDIRECT 77.0 04/15/2016 11:33 AM   . Pharmacist Clinical Goal(s): o Over the next 180 days, patient will work with PharmD and providers to maintain LDL goal < 70 . Current regimen:   Fenofibrate 160mg , 1 tablet once daily  Vascepa 1g, 2 capsules twice daily  Rosuvastatin 40mg , 1 tablet once daily  . Interventions: o We discussed:  diet and exercise extensively . We discussed how a diet high in plant sterols (fruits/vegetables/nuts/whole grains/legumes) may reduce your cholesterol.  Encouraged increasing fiber to a daily intake of 10-25g/day  . Patient self care activities - Over the next 180 days, patient will: o Continue current medication and work on lifestyle modifications (exercise and diet).   Diabetes Lab Results  Component Value Date/Time   HGBA1C 6.4 (A) 08/23/2019 09:30 AM   HGBA1C 6.4 (A) 05/24/2019 09:03 AM    HGBA1C 7.4 (H) 04/15/2016 11:33 AM   HGBA1C 6.4 03/18/2015 08:33 AM   . Pharmacist Clinical Goal(s): o Over the next 180 days, patient will work with PharmD and providers to maintain A1c goal <7% . Current regimen:   Insulin aspart (Novolog Flexpen), inject 20 to 30 units three times daily with meals  Insulin glargine (Lantus), inject 60 units at bedtime   Metformin 1000mg , 1 tablet twice daily with a meal   Jardiance 12.5mg , 1 tablet once daily (also for heart protection) . Interventions: o We discussed:  how to recognize and treat signs of hypoglycemia   Benefits of continuous glucose monitor (ex. Freestyle Libre or Dexcom G6) in detecting hypoglycemia at night and decreasing need of finger pricking.  . Patient self care activities - Over the next 180 days, patient will: o Check blood sugar 3-4 times daily, document, and provide at future appointments o Contact provider with any episodes of hypoglycemia  History of MI/ coronary atherosclerosis  . Pharmacist Clinical Goal(s) o Over the next 180 days, patient will work with PharmD and providers to minimize heart events.  . Current regimen:  o Aspirin 81mg , 1 tablet once daily  . Patient self care activities - Over the next 180 days, patient will: o Continue current medications as instructed.   Lower urinary tract symptoms  . Pharmacist Clinical Goal(s) o Over the next 180 days, patient will work with PharmD and providers to minimize symptoms  . Current regimen:  o Oxybutynin 5mg , 1 tablet once daily (taking on and off)  .  Patient self care activities o Patient will discuss at follow up with urologist.   Medication management . Pharmacist Clinical Goal(s): o Over the next 180 days, patient will work with PharmD and providers to maintain optimal medication adherence . Current pharmacy: [facility]  . Interventions o Comprehensive medication review performed. o Continue current medication management strategy . Patient  self care activities - Over the next 180 days, patient will: o Take medications as prescribed o Report any questions or concerns to PharmD and/or provider(s)  Initial goal documentation        Brent [name] was given information about Chronic Care Management services today including:  1. CCM service includes personalized support from designated clinical staff supervised by his physician, including individualized plan of care and coordination with other care providers 2. 24/7 contact phone numbers for assistance for urgent and routine care needs. 3. Standard insurance, coinsurance, copays and deductibles apply for chronic care management only during months in which we provide at least 20 minutes of these services. Most insurances cover these services at 100%, however patients may be responsible for any copay, coinsurance and/or deductible if applicable. This service may help you avoid the need for more expensive face-to-face services. 4. Only one practitioner may furnish and bill the service in a calendar month. 5. The patient may stop CCM services at any time (effective at the end of the month) by phone call to the office staff.  Patient agreed to services and verbal consent obtained.   The patient verbalized understanding of instructions provided today and agreed to receive a mailed copy of patient instruction and/or educational materials. Telephone follow up appointment with pharmacy team member scheduled for: 08/31/2020  Anson Crofts, PharmD Clinical Pharmacist [facility] [place] [phone]    High Triglycerides Eating Plan Triglycerides are a type of fat in the blood. High levels of triglycerides can increase your risk of heart disease and stroke. If your triglyceride levels are high, choosing the right foods can help lower your triglycerides and keep your heart healthy. Work with your health care provider or a diet and nutrition specialist (dietitian) to  develop an eating plan that is right for you. What are tips for following this plan? General guidelines   Lose weight, if you are overweight. For most people, losing 5-10 lbs (2-5 kg) helps lower triglyceride levels. A weight-loss plan may include. ? 30 minutes of exercise at least 5 days a week. ? Reducing the amount of calories, sugar, and fat you eat.  Eat a wide variety of fresh fruits, vegetables, and whole grains. These foods are high in fiber.  Eat foods that contain healthy fats, such as fatty fish, nuts, seeds, and olive oil.  Avoid foods that are high in added sugar, added salt (sodium), saturated fat, and trans fat.  Avoid low-fiber, refined carbohydrates such as white bread, crackers, noodles, and white rice.  Avoid foods with partially hydrogenated oils (trans fats), such as fried foods or stick margarine.  Limit alcohol intake to no more than 1 drink a day for nonpregnant women and 2 drinks a day for men. One drink equals 12 oz of beer, 5 oz of wine, or 1 oz of hard liquor. Your health care provider may recommend that you drink less depending on your overall health. Reading food labels  Check food labels for the amount of saturated fat. Choose foods with no or very little saturated fat.  Check food labels for the amount of trans fat. Choose  foods with no trans fat.  Check food labels for the amount of cholesterol. Choose foods low in cholesterol. Ask your dietitian how much cholesterol you should have each day.  Check food labels for the amount of sodium. Choose foods with less than 140 milligrams (mg) per serving. Shopping  Buy dairy products labeled as nonfat (skim) or low-fat (1%).  Avoid buying processed or prepackaged foods. These are often high in added sugar, sodium, and fat. Cooking  Choose healthy fats when cooking, such as olive oil or canola oil.  Cook foods using lower fat methods, such as baking, broiling, boiling, or grilling.  Make your own  sauces, dressings, and marinades when possible, instead of buying them. Store-bought sauces, dressings, and marinades are often high in sodium and sugar. Meal planning  Eat more home-cooked food and less restaurant, buffet, and fast food.  Eat fatty fish at least 2 times each week. Examples of fatty fish include salmon, trout, mackerel, tuna, and herring.  If you eat whole eggs, do not eat more than 3 egg yolks per week. What foods are recommended? The items listed may not be a complete list. Talk with your dietitian about what dietary choices are best for you. Grains Whole wheat or whole grain breads, crackers, cereals, and pasta. Unsweetened oatmeal. Bulgur. Barley. Quinoa. Brown rice. Whole wheat flour tortillas. Vegetables Fresh or frozen vegetables. Low-sodium canned vegetables. Fruits All fresh, canned (in natural juice), or frozen fruits. Meats and other protein foods Skinless chicken or [place]. Ground chicken or [place]. Lean cuts of pork, trimmed of fat. Fish and seafood, especially salmon, trout, and herring. Egg whites. Dried beans, peas, or lentils. Unsalted nuts or seeds. Unsalted canned beans. Natural peanut or almond butter. Dairy Low-fat dairy products. Skim or low-fat (1%) milk. Reduced fat (2%) and low-sodium cheese. Low-fat ricotta cheese. Low-fat cottage cheese. Plain, low-fat yogurt. Fats and oils Tub margarine without trans fats. Light or reduced-fat mayonnaise. Light or reduced-fat salad dressings. Avocado. Safflower, olive, sunflower, soybean, and canola oils. What foods are not recommended? The items listed may not be a complete list. Talk with your dietitian about what dietary choices are best for you. Grains White bread. White (regular) pasta. White rice. Cornbread. Bagels. Pastries. Crackers that contain trans fat. Vegetables Creamed or fried vegetables. Vegetables in a cheese sauce. Fruits Sweetened dried fruit. Canned fruit in syrup. Fruit juice. Meats and  other protein foods Fatty cuts of meat. Ribs. Chicken wings. Berniece Salines. Sausage. Bologna. Salami. Chitterlings. Fatback. Hot dogs. Bratwurst. Packaged lunch meats. Dairy Whole or reduced-fat (2%) milk. Half-and-half. Cream cheese. Full-fat or sweetened yogurt. Full-fat cheese. Nondairy creamers. Whipped toppings. Processed cheese or cheese spreads. Cheese curds. Beverages Alcohol. Sweetened drinks, such as soda, lemonade, fruit drinks, or punches. Fats and oils Butter. Stick margarine. Lard. Shortening. Ghee. Bacon fat. Tropical oils, such as coconut, palm kernel, or palm oils. Sweets and desserts Corn syrup. Sugars. Honey. Molasses. Candy. Jam and jelly. Syrup. Sweetened cereals. Cookies. Pies. Cakes. Donuts. Muffins. Ice cream. Condiments Store-bought sauces, dressings, and marinades that are high in sugar, such as ketchup and barbecue sauce. Summary  High levels of triglycerides can increase the risk of heart disease and stroke. Choosing the right foods can help lower your triglycerides.  Eat plenty of fresh fruits, vegetables, and whole grains. Choose low-fat dairy and lean meats. Eat fatty fish at least twice a week.  Avoid processed and prepackaged foods with added sugar, sodium, saturated fat, and trans fat.  If you need suggestions or have  questions about what types of food are good for you, talk with your health care provider or a dietitian. This information is not intended to replace advice given to you by your health care provider. Make sure you discuss any questions you have with your health care provider. Document Revised: 08/18/2017 Document Reviewed: 11/08/2016 Elsevier Patient Education  2020 [employer].

## 2020-03-08 ENCOUNTER — Emergency Department (HOSPITAL_BASED_OUTPATIENT_CLINIC_OR_DEPARTMENT_OTHER): Payer: Medicare Other

## 2020-03-08 ENCOUNTER — Emergency Department (HOSPITAL_COMMUNITY): Payer: Medicare Other

## 2020-03-08 ENCOUNTER — Inpatient Hospital Stay (HOSPITAL_COMMUNITY)
Admission: EM | Admit: 2020-03-08 | Discharge: 2020-03-25 | DRG: 058 | Disposition: A | Discharge status: Rehabilitation Facility | Payer: Medicare Other | Attending: Internal Medicine | Admitting: Internal Medicine

## 2020-03-08 ENCOUNTER — Observation Stay (HOSPITAL_COMMUNITY): Payer: Medicare Other

## 2020-03-08 ENCOUNTER — Encounter (HOSPITAL_COMMUNITY): Payer: Self-pay

## 2020-03-08 DIAGNOSIS — G360 Neuromyelitis optica [Devic]: Principal | ICD-10-CM | POA: Diagnosis present

## 2020-03-08 DIAGNOSIS — M21331 Wrist drop, right wrist: Secondary | ICD-10-CM | POA: Diagnosis not present

## 2020-03-08 DIAGNOSIS — N179 Acute kidney failure, unspecified: Secondary | ICD-10-CM

## 2020-03-08 DIAGNOSIS — Z5320 Procedure and treatment not carried out because of patient's decision for unspecified reasons: Secondary | ICD-10-CM | POA: Diagnosis not present

## 2020-03-08 DIAGNOSIS — J9621 Acute and chronic respiratory failure with hypoxia: Secondary | ICD-10-CM

## 2020-03-08 DIAGNOSIS — E860 Dehydration: Secondary | ICD-10-CM | POA: Diagnosis not present

## 2020-03-08 DIAGNOSIS — R52 Pain, unspecified: Secondary | ICD-10-CM

## 2020-03-08 DIAGNOSIS — G4733 Obstructive sleep apnea (adult) (pediatric): Secondary | ICD-10-CM | POA: Diagnosis present

## 2020-03-08 DIAGNOSIS — G8220 Paraplegia, unspecified: Secondary | ICD-10-CM

## 2020-03-08 DIAGNOSIS — R252 Cramp and spasm: Secondary | ICD-10-CM

## 2020-03-08 DIAGNOSIS — K5900 Constipation, unspecified: Secondary | ICD-10-CM | POA: Diagnosis present

## 2020-03-08 DIAGNOSIS — R739 Hyperglycemia, unspecified: Secondary | ICD-10-CM

## 2020-03-08 DIAGNOSIS — Z794 Long term (current) use of insulin: Secondary | ICD-10-CM

## 2020-03-08 DIAGNOSIS — I252 Old myocardial infarction: Secondary | ICD-10-CM

## 2020-03-08 DIAGNOSIS — J690 Pneumonitis due to inhalation of food and vomit: Secondary | ICD-10-CM | POA: Diagnosis not present

## 2020-03-08 DIAGNOSIS — Z880 Allergy status to penicillin: Secondary | ICD-10-CM

## 2020-03-08 DIAGNOSIS — Z7982 Long term (current) use of aspirin: Secondary | ICD-10-CM

## 2020-03-08 DIAGNOSIS — E1142 Type 2 diabetes mellitus with diabetic polyneuropathy: Secondary | ICD-10-CM

## 2020-03-08 DIAGNOSIS — R200 Anesthesia of skin: Secondary | ICD-10-CM | POA: Insufficient documentation

## 2020-03-08 DIAGNOSIS — R531 Weakness: Secondary | ICD-10-CM

## 2020-03-08 DIAGNOSIS — Z6832 Body mass index (BMI) 32.0-32.9, adult: Secondary | ICD-10-CM

## 2020-03-08 DIAGNOSIS — E1165 Type 2 diabetes mellitus with hyperglycemia: Secondary | ICD-10-CM | POA: Diagnosis not present

## 2020-03-08 DIAGNOSIS — R059 Cough, unspecified: Secondary | ICD-10-CM

## 2020-03-08 DIAGNOSIS — Z85820 Personal history of malignant melanoma of skin: Secondary | ICD-10-CM

## 2020-03-08 DIAGNOSIS — I2510 Atherosclerotic heart disease of native coronary artery without angina pectoris: Secondary | ICD-10-CM

## 2020-03-08 DIAGNOSIS — E781 Pure hyperglyceridemia: Secondary | ICD-10-CM | POA: Diagnosis present

## 2020-03-08 DIAGNOSIS — R29898 Other symptoms and signs involving the musculoskeletal system: Secondary | ICD-10-CM | POA: Diagnosis not present

## 2020-03-08 DIAGNOSIS — J189 Pneumonia, unspecified organism: Secondary | ICD-10-CM

## 2020-03-08 DIAGNOSIS — G4700 Insomnia, unspecified: Secondary | ICD-10-CM | POA: Diagnosis present

## 2020-03-08 DIAGNOSIS — E669 Obesity, unspecified: Secondary | ICD-10-CM | POA: Diagnosis present

## 2020-03-08 DIAGNOSIS — M545 Low back pain, unspecified: Secondary | ICD-10-CM

## 2020-03-08 DIAGNOSIS — B372 Candidiasis of skin and nail: Secondary | ICD-10-CM | POA: Diagnosis present

## 2020-03-08 DIAGNOSIS — J9811 Atelectasis: Secondary | ICD-10-CM | POA: Diagnosis not present

## 2020-03-08 DIAGNOSIS — E11649 Type 2 diabetes mellitus with hypoglycemia without coma: Secondary | ICD-10-CM | POA: Diagnosis not present

## 2020-03-08 DIAGNOSIS — Z9049 Acquired absence of other specified parts of digestive tract: Secondary | ICD-10-CM

## 2020-03-08 DIAGNOSIS — G8250 Quadriplegia, unspecified: Secondary | ICD-10-CM

## 2020-03-08 DIAGNOSIS — Z8719 Personal history of other diseases of the digestive system: Secondary | ICD-10-CM

## 2020-03-08 DIAGNOSIS — Z8249 Family history of ischemic heart disease and other diseases of the circulatory system: Secondary | ICD-10-CM

## 2020-03-08 DIAGNOSIS — E8770 Fluid overload, unspecified: Secondary | ICD-10-CM | POA: Diagnosis not present

## 2020-03-08 DIAGNOSIS — R079 Chest pain, unspecified: Secondary | ICD-10-CM | POA: Diagnosis present

## 2020-03-08 DIAGNOSIS — Z955 Presence of coronary angioplasty implant and graft: Secondary | ICD-10-CM

## 2020-03-08 DIAGNOSIS — J9601 Acute respiratory failure with hypoxia: Secondary | ICD-10-CM

## 2020-03-08 DIAGNOSIS — R0789 Other chest pain: Secondary | ICD-10-CM | POA: Diagnosis present

## 2020-03-08 DIAGNOSIS — Z7289 Other problems related to lifestyle: Secondary | ICD-10-CM

## 2020-03-08 DIAGNOSIS — Z801 Family history of malignant neoplasm of trachea, bronchus and lung: Secondary | ICD-10-CM

## 2020-03-08 DIAGNOSIS — R0682 Tachypnea, not elsewhere classified: Secondary | ICD-10-CM

## 2020-03-08 DIAGNOSIS — L304 Erythema intertrigo: Secondary | ICD-10-CM | POA: Diagnosis present

## 2020-03-08 DIAGNOSIS — T380X5A Adverse effect of glucocorticoids and synthetic analogues, initial encounter: Secondary | ICD-10-CM | POA: Diagnosis not present

## 2020-03-08 DIAGNOSIS — R0600 Dyspnea, unspecified: Secondary | ICD-10-CM

## 2020-03-08 DIAGNOSIS — I10 Essential (primary) hypertension: Secondary | ICD-10-CM | POA: Diagnosis present

## 2020-03-08 DIAGNOSIS — N400 Enlarged prostate without lower urinary tract symptoms: Secondary | ICD-10-CM | POA: Diagnosis present

## 2020-03-08 DIAGNOSIS — M21332 Wrist drop, left wrist: Secondary | ICD-10-CM | POA: Diagnosis not present

## 2020-03-08 DIAGNOSIS — J449 Chronic obstructive pulmonary disease, unspecified: Secondary | ICD-10-CM | POA: Diagnosis present

## 2020-03-08 DIAGNOSIS — Z87891 Personal history of nicotine dependence: Secondary | ICD-10-CM

## 2020-03-08 DIAGNOSIS — E785 Hyperlipidemia, unspecified: Secondary | ICD-10-CM

## 2020-03-08 DIAGNOSIS — E871 Hypo-osmolality and hyponatremia: Secondary | ICD-10-CM

## 2020-03-08 DIAGNOSIS — F329 Major depressive disorder, single episode, unspecified: Secondary | ICD-10-CM | POA: Diagnosis present

## 2020-03-08 DIAGNOSIS — Z79899 Other long term (current) drug therapy: Secondary | ICD-10-CM

## 2020-03-08 DIAGNOSIS — F419 Anxiety disorder, unspecified: Secondary | ICD-10-CM | POA: Diagnosis present

## 2020-03-08 DIAGNOSIS — Z713 Dietary counseling and surveillance: Secondary | ICD-10-CM

## 2020-03-08 DIAGNOSIS — Y95 Nosocomial condition: Secondary | ICD-10-CM | POA: Diagnosis not present

## 2020-03-08 DIAGNOSIS — J9690 Respiratory failure, unspecified, unspecified whether with hypoxia or hypercapnia: Secondary | ICD-10-CM

## 2020-03-08 DIAGNOSIS — Z20822 Contact with and (suspected) exposure to covid-19: Secondary | ICD-10-CM | POA: Diagnosis present

## 2020-03-08 LAB — CBC WITH DIFFERENTIAL/PLATELET
Abs Immature Granulocytes: 0.02 10*3/uL (ref 0.00–0.07)
Basophils Absolute: 0 10*3/uL (ref 0.0–0.1)
Basophils Relative: 0 %
Eosinophils Absolute: 0.1 10*3/uL (ref 0.0–0.5)
Eosinophils Relative: 1 %
HCT: 42.2 % (ref 39.0–52.0)
Hemoglobin: 13.8 g/dL (ref 13.0–17.0)
Immature Granulocytes: 0 %
Lymphocytes Relative: 41 %
Lymphs Abs: 2.9 10*3/uL (ref 0.7–4.0)
MCH: 29.1 pg (ref 26.0–34.0)
MCHC: 32.7 g/dL (ref 30.0–36.0)
MCV: 89 fL (ref 80.0–100.0)
Monocytes Absolute: 0.3 10*3/uL (ref 0.1–1.0)
Monocytes Relative: 5 %
Neutro Abs: 3.8 10*3/uL (ref 1.7–7.7)
Neutrophils Relative %: 53 %
Platelets: 173 10*3/uL (ref 150–400)
RBC: 4.74 MIL/uL (ref 4.22–5.81)
RDW: 13.3 % (ref 11.5–15.5)
WBC: 7.2 10*3/uL (ref 4.0–10.5)
nRBC: 0 % (ref 0.0–0.2)

## 2020-03-08 LAB — COMPREHENSIVE METABOLIC PANEL
ALT: 30 U/L (ref 0–44)
AST: 40 U/L (ref 15–41)
Albumin: 4 g/dL (ref 3.5–5.0)
Alkaline Phosphatase: 71 U/L (ref 38–126)
Anion gap: 12 (ref 5–15)
BUN: 18 mg/dL (ref 8–23)
CO2: 24 mmol/L (ref 22–32)
Calcium: 9.5 mg/dL (ref 8.9–10.3)
Chloride: 103 mmol/L (ref 98–111)
Creatinine, Ser: 0.96 mg/dL (ref 0.61–1.24)
GFR calc Af Amer: >60 mL/min (ref >60)
GFR calc non Af Amer: >60 mL/min (ref >60)
Glucose, Bld: 229 mg/dL — ABNORMAL HIGH (ref 70–99)
Potassium: 4 mmol/L (ref 3.5–5.1)
Sodium: 139 mmol/L (ref 135–145)
Total Bilirubin: 0.8 mg/dL (ref 0.3–1.2)
Total Protein: 6.5 g/dL (ref 6.5–8.1)

## 2020-03-08 LAB — SARS CORONAVIRUS 2 BY RT PCR (HOSPITAL ORDER, PERFORMED IN ~~LOC~~ HOSPITAL LAB): SARS Coronavirus 2: NEGATIVE

## 2020-03-08 LAB — GLUCOSE, CAPILLARY: Glucose-Capillary: 210 mg/dL — ABNORMAL HIGH (ref 70–99)

## 2020-03-08 LAB — TROPONIN I (HIGH SENSITIVITY)
Troponin I (High Sensitivity): 4 ng/L (ref <18)
Troponin I (High Sensitivity): 7 ng/L (ref <18)

## 2020-03-08 LAB — CBG MONITORING, ED: Glucose-Capillary: 111 mg/dL — ABNORMAL HIGH (ref 70–99)

## 2020-03-08 LAB — LIPASE, BLOOD: Lipase: 29 U/L (ref 11–51)

## 2020-03-08 MED ORDER — LISINOPRIL 10 MG PO TABS
10.0000 mg | ORAL_TABLET | Freq: Every day | ORAL | Status: DC
  Administered 2020-03-09: 10 mg via ORAL
  Filled 2020-03-08: qty 1

## 2020-03-08 MED ORDER — ASPIRIN EC 81 MG PO TBEC
81.0000 mg | DELAYED_RELEASE_TABLET | Freq: Every day | ORAL | Status: DC
  Administered 2020-03-09 – 2020-03-24 (×16): 81 mg via ORAL
  Filled 2020-03-08 (×16): qty 1

## 2020-03-08 MED ORDER — FLUTICASONE PROPIONATE 50 MCG/ACT NA SUSP
2.0000 | Freq: Every day | NASAL | Status: DC
  Administered 2020-03-08 – 2020-03-24 (×17): 2 via NASAL
  Filled 2020-03-08: qty 16

## 2020-03-08 MED ORDER — LORAZEPAM 2 MG/ML IJ SOLN
1.0000 mg | Freq: Once | INTRAMUSCULAR | Status: AC
  Administered 2020-03-08: 1 mg via INTRAVENOUS
  Filled 2020-03-08: qty 1

## 2020-03-08 MED ORDER — LORAZEPAM 1 MG PO TABS
1.0000 mg | ORAL_TABLET | Freq: Every evening | ORAL | Status: DC | PRN
  Administered 2020-03-11 – 2020-03-23 (×8): 1 mg via ORAL
  Filled 2020-03-08 (×8): qty 1

## 2020-03-08 MED ORDER — OXYBUTYNIN CHLORIDE 5 MG PO TABS
5.0000 mg | ORAL_TABLET | Freq: Two times a day (BID) | ORAL | Status: DC
  Administered 2020-03-08 – 2020-03-25 (×33): 5 mg via ORAL
  Filled 2020-03-08 (×35): qty 1

## 2020-03-08 MED ORDER — INSULIN ASPART 100 UNIT/ML ~~LOC~~ SOLN
0.0000 [IU] | Freq: Three times a day (TID) | SUBCUTANEOUS | Status: DC
  Administered 2020-03-09: 3 [IU] via SUBCUTANEOUS
  Administered 2020-03-09: 2 [IU] via SUBCUTANEOUS
  Administered 2020-03-10 (×2): 1 [IU] via SUBCUTANEOUS
  Administered 2020-03-11: 2 [IU] via SUBCUTANEOUS
  Administered 2020-03-11: 9 [IU] via SUBCUTANEOUS
  Administered 2020-03-11: 3 [IU] via SUBCUTANEOUS
  Administered 2020-03-12 (×2): 2 [IU] via SUBCUTANEOUS
  Administered 2020-03-12: 1 [IU] via SUBCUTANEOUS
  Administered 2020-03-13: 2 [IU] via SUBCUTANEOUS
  Administered 2020-03-13 – 2020-03-14 (×2): 1 [IU] via SUBCUTANEOUS
  Administered 2020-03-14: 2 [IU] via SUBCUTANEOUS

## 2020-03-08 MED ORDER — NITROGLYCERIN 2 % TD OINT
1.0000 [in_us] | TOPICAL_OINTMENT | Freq: Four times a day (QID) | TRANSDERMAL | Status: DC
  Administered 2020-03-09 (×3): 1 [in_us] via TOPICAL
  Filled 2020-03-08: qty 30

## 2020-03-08 MED ORDER — ACETAMINOPHEN 325 MG PO TABS
650.0000 mg | ORAL_TABLET | Freq: Four times a day (QID) | ORAL | Status: DC | PRN
  Administered 2020-03-08 – 2020-03-19 (×11): 650 mg via ORAL
  Filled 2020-03-08 (×11): qty 2

## 2020-03-08 MED ORDER — ACETAMINOPHEN 650 MG RE SUPP
650.0000 mg | Freq: Four times a day (QID) | RECTAL | Status: DC | PRN
  Administered 2020-03-17: 650 mg via RECTAL
  Filled 2020-03-08: qty 1

## 2020-03-08 MED ORDER — ROSUVASTATIN CALCIUM 20 MG PO TABS
40.0000 mg | ORAL_TABLET | Freq: Every day | ORAL | Status: DC
  Administered 2020-03-08 – 2020-03-21 (×15): 40 mg via ORAL
  Filled 2020-03-08: qty 8
  Filled 2020-03-08 (×2): qty 2
  Filled 2020-03-08: qty 8
  Filled 2020-03-08 (×8): qty 2
  Filled 2020-03-08: qty 8
  Filled 2020-03-08 (×3): qty 2
  Filled 2020-03-08: qty 8

## 2020-03-08 MED ORDER — MORPHINE SULFATE (PF) 2 MG/ML IV SOLN
2.0000 mg | Freq: Once | INTRAVENOUS | Status: AC
  Administered 2020-03-08: 2 mg via INTRAVENOUS
  Filled 2020-03-08: qty 1

## 2020-03-08 MED ORDER — LIDOCAINE 5 % EX PTCH
1.0000 | MEDICATED_PATCH | CUTANEOUS | Status: DC
  Administered 2020-03-08 – 2020-03-24 (×15): 1 via TRANSDERMAL
  Filled 2020-03-08 (×16): qty 1

## 2020-03-08 MED ORDER — INSULIN ASPART 100 UNIT/ML ~~LOC~~ SOLN
0.0000 [IU] | Freq: Every day | SUBCUTANEOUS | Status: DC
  Administered 2020-03-08: 2 [IU] via SUBCUTANEOUS
  Administered 2020-03-11: 4 [IU] via SUBCUTANEOUS
  Administered 2020-03-13: 2 [IU] via SUBCUTANEOUS
  Administered 2020-03-14: 3 [IU] via SUBCUTANEOUS

## 2020-03-08 MED ORDER — ADULT MULTIVITAMIN W/MINERALS CH
1.0000 | ORAL_TABLET | Freq: Every day | ORAL | Status: DC
  Administered 2020-03-09 – 2020-03-25 (×17): 1 via ORAL
  Filled 2020-03-08 (×17): qty 1

## 2020-03-08 MED ORDER — ENOXAPARIN SODIUM 40 MG/0.4ML ~~LOC~~ SOLN
40.0000 mg | SUBCUTANEOUS | Status: DC
  Administered 2020-03-08 – 2020-03-09 (×2): 40 mg via SUBCUTANEOUS
  Filled 2020-03-08 (×2): qty 0.4

## 2020-03-08 MED ORDER — MORPHINE SULFATE (PF) 4 MG/ML IV SOLN
4.0000 mg | Freq: Once | INTRAVENOUS | Status: AC
  Administered 2020-03-08: 4 mg via INTRAVENOUS
  Filled 2020-03-08: qty 1

## 2020-03-08 MED ORDER — INSULIN ASPART 100 UNIT/ML FLEXPEN
25.0000 [IU] | PEN_INJECTOR | Freq: Three times a day (TID) | SUBCUTANEOUS | Status: DC
  Filled 2020-03-08: qty 3

## 2020-03-08 MED ORDER — ICOSAPENT ETHYL 1 G PO CAPS
2.0000 g | ORAL_CAPSULE | Freq: Two times a day (BID) | ORAL | Status: DC
  Administered 2020-03-08 – 2020-03-25 (×32): 2 g via ORAL
  Filled 2020-03-08 (×37): qty 2

## 2020-03-08 MED ORDER — SERTRALINE HCL 50 MG PO TABS
150.0000 mg | ORAL_TABLET | Freq: Every day | ORAL | Status: DC
  Administered 2020-03-08 – 2020-03-24 (×17): 150 mg via ORAL
  Filled 2020-03-08 (×17): qty 1

## 2020-03-08 MED ORDER — FENOFIBRATE 160 MG PO TABS
160.0000 mg | ORAL_TABLET | Freq: Every day | ORAL | Status: DC
  Administered 2020-03-08 – 2020-03-24 (×17): 160 mg via ORAL
  Filled 2020-03-08 (×18): qty 1

## 2020-03-08 MED ORDER — ZOLPIDEM TARTRATE 10 MG PO TABS
10.0000 mg | ORAL_TABLET | Freq: Every day | ORAL | Status: DC
  Administered 2020-03-08 – 2020-03-14 (×7): 10 mg via ORAL
  Filled 2020-03-08 (×7): qty 2

## 2020-03-08 MED ORDER — SODIUM CHLORIDE 0.9 % IV BOLUS
500.0000 mL | Freq: Once | INTRAVENOUS | Status: AC
  Administered 2020-03-08: 500 mL via INTRAVENOUS

## 2020-03-08 MED ORDER — IOHEXOL 350 MG/ML SOLN
100.0000 mL | Freq: Once | INTRAVENOUS | Status: AC | PRN
  Administered 2020-03-08: 100 mL via INTRAVENOUS

## 2020-03-08 MED ORDER — VITAMIN B-12 100 MCG PO TABS
500.0000 ug | ORAL_TABLET | Freq: Every day | ORAL | Status: DC
  Administered 2020-03-09 – 2020-03-25 (×18): 500 ug via ORAL
  Filled 2020-03-08 (×2): qty 1
  Filled 2020-03-08: qty 5
  Filled 2020-03-08 (×3): qty 1
  Filled 2020-03-08: qty 5
  Filled 2020-03-08: qty 1
  Filled 2020-03-08 (×2): qty 5
  Filled 2020-03-08 (×2): qty 1
  Filled 2020-03-08: qty 5
  Filled 2020-03-08 (×4): qty 1

## 2020-03-08 MED ORDER — FENTANYL CITRATE (PF) 100 MCG/2ML IJ SOLN
100.0000 ug | Freq: Once | INTRAMUSCULAR | Status: AC
  Administered 2020-03-08: 100 ug via INTRAVENOUS
  Filled 2020-03-08: qty 2

## 2020-03-08 MED ORDER — INSULIN GLARGINE 100 UNIT/ML ~~LOC~~ SOLN
60.0000 [IU] | Freq: Every day | SUBCUTANEOUS | Status: DC
  Administered 2020-03-08 – 2020-03-14 (×7): 60 [IU] via SUBCUTANEOUS
  Filled 2020-03-08 (×9): qty 0.6

## 2020-03-08 MED ORDER — NITROGLYCERIN 0.4 MG SL SUBL
0.4000 mg | SUBLINGUAL_TABLET | SUBLINGUAL | Status: AC | PRN
  Administered 2020-03-08 (×3): 0.4 mg via SUBLINGUAL
  Filled 2020-03-08 (×2): qty 1

## 2020-03-08 MED ORDER — INSULIN GLARGINE 100 UNITS/ML SOLOSTAR PEN
60.0000 [IU] | PEN_INJECTOR | Freq: Every day | SUBCUTANEOUS | Status: DC
  Filled 2020-03-08: qty 3

## 2020-03-08 MED ORDER — INSULIN ASPART 100 UNIT/ML ~~LOC~~ SOLN
25.0000 [IU] | Freq: Three times a day (TID) | SUBCUTANEOUS | Status: DC
  Administered 2020-03-09 – 2020-03-13 (×12): 25 [IU] via SUBCUTANEOUS

## 2020-03-08 NOTE — Consult Note (Addendum)
Cardiology Consultation:   Patient ID: Esmeralda Blanford. MRN: [id number]; DOB: 05/08/1946  Admit date: 03/08/2020 Date of Consult: 03/08/2020  Primary Care Provider: [name], [name], MD [facility] Cardiologist: Lauree Chandler, MD  [facility] Electrophysiologist:  None    Patient Profile:   Bolden Hagerman. is a 74 y.o. male with a hx of CAD status post PCI in 1990 and 2000, OSA, diabetes, obesity, hypertension, hyperlipidemia who is being seen today for the evaluation of chest pain at the request of Dr. Rex Kras.  History of Present Illness:   Mr. Francis is a 74 year old male with the above medical history who presents with chest pain.  He underwent angioplasty of his RCA in 1990.  In 2000, he had a bare-metal stent placed in his RCA and subsequently developed in-stent restenosis and underwent rotational atherectomy.  His most recent catheterization was in 2012 and showed stable moderate CAD (70% mid LAD, 80% diagonal, 70% Ramus, 40% mid to distal RCA stent restenosis).  Most recent stress Myoview was in January 2015 and showed 1 mm ST depressions in inferior/lateral leads with stress but normal perfusion.  Most recent echocardiogram was in 07/2017 and showed normal LV systolic function, mild diastolic function, no significant valvular disease.  In the ED, initial vitals showed BP 140/56, pulse 69, SPO2 99% on room air.  Labs notable for high-sensitivity troponin 4 > 7, creatinine 0.96, hemoglobin 13.8, platelets.  CTA chest abdomen pelvis was done which showed no aortic aneurysm or dissection.  EKG shows sinus rhythm, rate 68, poor R wave progression, less than 1 mm ST depressions in leads I, II and T wave inversions in aVL.  While in the ED, he reported onset of sudden weakness in his right leg, states that he was unable to move his leg.  Sensation intact, and no other neuro symptoms.      Past Medical History:  Diagnosis Date  . Allergy   . CAD (coronary  artery disease)    a. angioplasty of his RCA in 1990. b. bare metal stent placed in the RCA in 2000 followed by rotational atherectomy shortly after for stent restenosis. c. last cath was in 2012 showing stable moderate diffuse CAD. (70% mid LAD, 80% diagonal, 70% Ramus, 40% mid to distal RCA stent restenosis). d. Low risk nuc in 2015.  . Diabetes mellitus   . Diverticulosis   . Elevated CK   . Erectile dysfunction   . Hemorrhoids   . HTN (hypertension)   . Hyperlipidemia   . Hypertriglyceridemia   . Malignant melanoma of left side of neck ([place]) 10/25/2018  . Myocardial infarction ([place])   . Obesity   . OSA (obstructive sleep apnea)   . Persistent disorder of initiating or maintaining sleep   . Personal history of colonic polyps 02/05/2003    Past Surgical History:  Procedure Laterality Date  . CHOLECYSTECTOMY    . CORONARY ANGIOPLASTY    . CORONARY STENT PLACEMENT     stenting of the right coronary artery with followup rotational  atherectomy. (3 stents placed)  . FINGER SURGERY     right  . FOOT SURGERY     right  . INGUINAL HERNIA REPAIR     right  . melanoma removal     neck  . ORTHOPEDIC SURGERY     foot right  . rotator cuff surg     Bil       Inpatient Medications: Scheduled Meds:  Continuous Infusions:  PRN Meds:  nitroGLYCERIN  Allergies:    Allergies  Allergen Reactions  . Penicillins Hives and Rash    Social History:   Social History   Socioeconomic History  . Marital status: Married    Spouse name: Not on file  . Number of children: 1  . Years of education: Not on file  . Highest education level: Not on file  Occupational History  . Occupation: Teacher, English as a foreign language    Comment: [place] Use  . Smoking status: Former Smoker    Packs/day: 1.50    Years: 30.00    Pack years: 45.00    Types: Cigarettes    Quit date: 09/19/1980    Years since quitting: 39.4  . Smokeless tobacco: Never Used  Vaping Use  . Vaping Use: Never used  Substance  and Sexual Activity  . Alcohol use: Yes    Alcohol/week: 7.0 - 14.0 standard drinks    Types: 7 - 14 Shots of liquor per week  . Drug use: No  . Sexual activity: Not on file  Other Topics Concern  . Not on file  Social History Narrative   12/14/2018: Lives with wife and 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 (son had passed away several years ago)   Recevies much of his care through [place]   Enjoys golfing   Social Determinants of Health   Financial Resource Strain: Low Risk   . Difficulty of Paying Living Expenses: Not hard at all  Food Insecurity:   . Worried About Charity fundraiser in the Last Year:   . Arboriculturist in the Last Year:   Transportation Needs: No Transportation Needs  . Lack of Transportation (Medical): No  . Lack of Transportation (Non-Medical): No  Physical Activity:   . Days of Exercise per Week:   . Minutes of Exercise per Session:   Stress:   . Feeling of Stress :   Social Connections:   . Frequency of Communication with Friends and Family:   . Frequency of Social Gatherings with Friends and Family:   . Attends Religious Services:   . Active Member of Clubs or Organizations:   . Attends Archivist Meetings:   Marland Kitchen Marital Status:   Intimate Partner Violence:   . Fear of Current or Ex-Partner:   . Emotionally Abused:   Marland Kitchen Physically Abused:   . Sexually Abused:     Family History:    Family History  Problem Relation Age of Onset  . Liver cancer Father   . Lung cancer Father   . Heart disease Father   . Heart disease Sister   . Lung cancer Mother   . COPD Mother   . Obesity Mother   . Colon cancer Neg Hx   . Stomach cancer Neg Hx   . Pancreatic cancer Neg Hx      ROS:  Please see the history of present illness.   All other ROS reviewed and negative.     Physical Exam/Data:   Vitals:   03/08/20 1134 03/08/20 1144 03/08/20 1244 03/08/20 1300  BP:  (!) 96/52 123/86 (!) 149/56  Pulse:  68  64  Resp:  15  19  Temp:      TempSrc:      SpO2:   97%  98%  Weight: 101.2 kg     Height: 6' (1.829 m)      No intake or output data in the 24 hours ending 03/08/20 1508 Last 3 Weights 03/08/2020 08/23/2019 08/09/2019  Weight (  lbs) 223 lb 221 lb 8 oz 221 lb 9.6 oz  Weight (kg) 101.152 kg 100.472 kg 100.517 kg     Body mass index is 30.24 kg/m.  General:   in no acute distress HEENT: normal Neck: no JVD Vascular: 2+ dorsalis pedis pulses bilaterally Cardiac:  normal S1, S2; RRR; no murmur  Lungs:  clear to auscultation bilaterally, no wheezing, rhonchi or rales  Abd: soft, nontender, no hepatomegaly  Ext: no edema Musculoskeletal:  No deformities Skin: warm and dry  Neuro:  no focal abnormalities noted Psych:  Normal affect   EKG:  The EKG was personally reviewed and demonstrates:   sinus rhythm, rate 68, poor R wave progression, less than 1 mm ST depressions in leads I, II and T wave inversions in aVL. Telemetry:  Telemetry was personally reviewed and demonstrates:  NSR  Relevant CV Studies: ABIs: Right: Resting right ankle-brachial index is within normal range. No  evidence of significant right lower extremity arterial disease. The right  toe-brachial index is normal.   Left: Resting left ankle-brachial index is within normal range. No  evidence of significant left lower extremity arterial disease. The left  toe-brachial index is normal.   Laboratory Data:  High Sensitivity Troponin:   Recent Labs  Lab 03/08/20 1142 03/08/20 1359  TROPONINIHS 4 7     Chemistry Recent Labs  Lab 03/08/20 1142  NA 139  K 4.0  CL 103  CO2 24  GLUCOSE 229*  BUN 18  CREATININE 0.96  CALCIUM 9.5  GFRNONAA >60  GFRAA >60  ANIONGAP 12    Recent Labs  Lab 03/08/20 1142  PROT 6.5  ALBUMIN 4.0  AST 40  ALT 30  ALKPHOS 71  BILITOT 0.8   Hematology Recent Labs  Lab 03/08/20 1142  WBC 7.2  RBC 4.74  HGB 13.8  HCT 42.2  MCV 89.0  MCH 29.1  MCHC 32.7  RDW 13.3  PLT 173   BNPNo results for input(s): BNP, PROBNP in  the last 168 hours.  DDimer No results for input(s): DDIMER in the last 168 hours.   Radiology/Studies:  DG Chest Portable 1 View  Result Date: 03/08/2020 CLINICAL DATA:  Central chest pain radiating to back which shortness-of-breath and diaphoresis. EXAM: PORTABLE CHEST 1 VIEW COMPARISON:  11/11/2018 FINDINGS: Lungs are adequately inflated without focal airspace consolidation or effusion. Borderline stable cardiomegaly. Degenerative change of the spine. IMPRESSION: No acute cardiopulmonary disease. Electronically Signed   By: Marin Olp M.D.   On: 03/08/2020 12:31   CT Angio Chest/Abd/Pel for Dissection W and/or W/WO  Result Date: 03/08/2020 CLINICAL DATA:  Chest pain radiating to the back. Shortness of breath EXAM: CT ANGIOGRAPHY CHEST, ABDOMEN AND PELVIS TECHNIQUE: Non-contrast CT of the chest was initially obtained. Multidetector CT imaging through the chest, abdomen and pelvis was performed using the standard protocol during bolus administration of intravenous contrast. Multiplanar reconstructed images and MIPs were obtained and reviewed to evaluate the vascular anatomy. CONTRAST:  186mL OMNIPAQUE IOHEXOL 350 MG/ML SOLN COMPARISON:  CT 07/03/2015, 07/16/2012. FINDINGS: CTA CHEST FINDINGS Cardiovascular: Preferential opacification of the thoracic aorta. No evidence of thoracic aortic aneurysm or dissection. Precontrast images of the chest reveal no evidence of intramural hematoma. Three vessel arch. Atherosclerotic calcifications of the aorta and coronary arteries. Normal heart size. No pericardial effusion. Mediastinum/Nodes: No enlarged mediastinal, hilar, or axillary lymph nodes. Thyroid gland, trachea, and esophagus demonstrate no significant findings. Lungs/Pleura: Mild bibasilar subsegmental atelectasis. Mosaic attenuation within the mid to lower lung fields  bilaterally. No focal airspace consolidation. No pleural effusion or pneumothorax. Musculoskeletal: No chest wall abnormality. No acute  or significant osseous findings. Review of the MIP images confirms the above findings. CTA ABDOMEN AND PELVIS FINDINGS VASCULAR Aorta: Normal caliber aorta without aneurysm, dissection, vasculitis or significant stenosis. Moderate calcified atherosclerotic plaque. Celiac: Patent without evidence of aneurysm, dissection, vasculitis or significant stenosis. SMA: Patent without evidence of aneurysm, dissection, vasculitis or significant stenosis. Renals: Both renal arteries are patent without evidence of aneurysm, dissection, vasculitis, fibromuscular dysplasia or significant stenosis. IMA: Patent. Inflow: Patent without evidence of aneurysm, dissection, vasculitis or significant stenosis. Moderate calcified atherosclerotic plaque. Veins: No obvious venous abnormality within the limitations of this arterial phase study. Review of the MIP images confirms the above findings. NON-VASCULAR Hepatobiliary: No focal liver abnormality is seen. Status post cholecystectomy. No biliary dilatation. Pancreas: Unremarkable. No pancreatic ductal dilatation or surrounding inflammatory changes. Spleen: Normal in size without focal abnormality. Adrenals/Urinary Tract: Unremarkable adrenal glands. Left upper pole renal cyst with layering high attenuation material has slightly increased in size from 2016, now measuring 3.9 cm and previously measuring 3.4 cm. Additional mid pole 2.9 cm left simple renal cyst has also slightly increased in size (previously measured 2.2 cm. The right kidney is unremarkable. No suspicious renal mass, stone, or hydronephrosis. Ureters and urinary bladder within normal limits. Stomach/Bowel: Stomach is within normal limits. Appendix appears normal (series 7, image 257). Scattered colonic diverticulosis. No evidence of bowel wall thickening, distention, or inflammatory changes. Lymphatic: No abdominopelvic lymphadenopathy. Reproductive: Prostate gland within normal limits. Other: No abdominal wall hernia or  abnormality. No abdominopelvic ascites. Soft tissue density within the lower anterior abdominal wall, slightly increased from prior, and may reflect injection related changes. Musculoskeletal: No acute or significant osseous findings. Review of the MIP images confirms the above findings. IMPRESSION: 1. Negative for aortic aneurysm or dissection. 2. Mosaic attenuation within the mid to lower lung fields bilaterally, which can be seen in the setting of small airways disease. 3. No acute findings within the chest, abdomen, or pelvis. 4. Colonic diverticulosis without evidence of acute diverticulitis. 5. Aortic and coronary artery atherosclerosis. Electronically Signed   By: Davina Poke D.O.   On: 03/08/2020 13:56      Assessment and Plan:   Chest pain: Given continuous chest pain with negative troponins, no evidence of an acute coronary syndrome as the cause of his symptoms.  However does have known CAD with most recent catheterization was in 2012 showed stable moderate CAD (70% mid LAD, 80% diagonal, 70% Ramus, 40% mid to distal RCA stent restenosis).  Normal LV systolic function on echo in 2018.  Most recent Myoview in 2015 showed normal perfusion. - Plan for lexiscan myoview for evaluation of ischemia  Acute RLE weakness: unclear cause, states it started while in ED.  Reports cannot move his right leg.  Leg is cold compared to left though I do feel a dorsalis pedis pulse.  ABIs are normal.  No evidence of dissection on CTA a/p.  No evidence of vascular cause. ?Consider neuro evaluation   For questions or updates, please contact [place] Please consult [email or web address] for contact info under    Signed, Donato Heinz, MD  03/08/2020 3:08 PM

## 2020-03-08 NOTE — ED Notes (Signed)
PT's wife to desk stating that pt has had increase in chest pain. RN aware.

## 2020-03-08 NOTE — H&P (Signed)
History and Physical    Brent [name]. PXT:[id number] DOB: [birth year] DOA: 03/08/2020  PCP: [name], [name], MD (Confirm with patient/family/NH records and if not entered, this has to be entered at [facility] point of entry) Patient coming from: Home  I have personally briefly reviewed patient's old medical records in [place]  Chief Complaint: Chest pain and new onset right leg weakness  HPI: Brent [name]. is a 74 y.o. male with medical history significant of remote MI stenting x3 in 1990s, CAD, IDDM, DM neuropathy, sciatica with injection treatment 2 years ago, HTN, HLD, came with new onset of chest pain.  Chest pain happened about patient was in the church and standing.  Pressure-like 8-9 over 10, associated with feeling of nausea and sweating no vomiting no short of breath no palpitations.  EMS arrived and gave patient some aspirin nitroglycerin sublingual with little improvement.  Then upon arrival in the ED the chest pain slowly progressed to radiate to the left shoulder and his back, then slowly subsided he estimated the whole process about an hour.  While staying in the ED patient started to feel weakness of the whole right-sided leg, he remember that while lying on the stretcher in the ambulance, the stretcher had a bump right beneath his right hip area, where he had " bad inflamed nerve which received injection about 2 years ago in [place]".  Although his right leg/buttock symptoms 2 years ago was mainly pain and no weakness or numbness of the right leg compared to this time he feels the whole right leg very discomfort but denies any numbness or loss of feeling but only weakness from hip to toe on the right side.  He denied baseline claudication, he does have chronic back pain on and off.  Patient also admitted he has a baseline diabetic neuropathy with frequent on and off tingling sensation of toes and fingertips. ED Course: Troponin II sets negative, CTA dissection study  negative.  Cardiology recommended TBI study to rule out acute right lower extremity ischemia which came back negative.  Review of Systems: As per HPI otherwise 10 point review of systems negative.    Past Medical History:  Diagnosis Date  . Allergy   . CAD (coronary artery disease)    a. angioplasty of his RCA in 1990. b. bare metal stent placed in the RCA in 2000 followed by rotational atherectomy shortly after for stent restenosis. c. last cath was in 2012 showing stable moderate diffuse CAD. (70% mid LAD, 80% diagonal, 70% Ramus, 40% mid to distal RCA stent restenosis). d. Low risk nuc in 2015.  . Diabetes mellitus   . Diverticulosis   . Elevated CK   . Erectile dysfunction   . Hemorrhoids   . HTN (hypertension)   . Hyperlipidemia   . Hypertriglyceridemia   . Malignant melanoma of left side of neck ([place]) 10/25/2018  . Myocardial infarction ([place])   . Obesity   . OSA (obstructive sleep apnea)   . Persistent disorder of initiating or maintaining sleep   . Personal history of colonic polyps 02/05/2003    Past Surgical History:  Procedure Laterality Date  . CHOLECYSTECTOMY    . CORONARY ANGIOPLASTY    . CORONARY STENT PLACEMENT     stenting of the right coronary artery with followup rotational  atherectomy. (3 stents placed)  . FINGER SURGERY     right  . FOOT SURGERY     right  . INGUINAL HERNIA REPAIR  right  . melanoma removal     neck  . ORTHOPEDIC SURGERY     foot right  . rotator cuff surg     Bil     reports that he quit smoking about 39 years ago. His smoking use included cigarettes. He has a 45.00 pack-year smoking history. He has never used smokeless tobacco. He reports current alcohol use of about 7.0 - 14.0 standard drinks of alcohol per week. He reports that he does not use drugs.  Allergies  Allergen Reactions  . Penicillins Hives and Rash    Family History  Problem Relation Age of Onset  . Liver cancer Father   . Lung cancer Father   . Heart  disease Father   . Heart disease Sister   . Lung cancer Mother   . COPD Mother   . Obesity Mother   . Colon cancer Neg Hx   . Stomach cancer Neg Hx   . Pancreatic cancer Neg Hx      Prior to Admission medications   Medication Sig Start Date End Date Taking? Authorizing Provider  aspirin 81 MG tablet Take 81 mg by mouth daily.    [provider]  benzonatate (TESSALON) 100 MG capsule Take 1 capsule (100 mg total) by mouth 2 (two) times daily as needed for cough. Patient not taking: Reported on 03/06/2020 12/19/19   [name], [name], MD  empagliflozin (JARDIANCE) 25 MG TABS tablet Take 12.5 mg by mouth daily.    [provider]  fenofibrate 160 MG tablet Take 160 mg by mouth daily.    [provider]  fluticasone (FLONASE) 50 MCG/ACT nasal spray SPRAY 2 SPRAYS INTO EACH NOSTRIL EVERY DAY 12/23/19   Young, Kasandra Knudsen, MD  Icosapent Ethyl (VASCEPA) 1 g CAPS Take 2 capsules (2 g total) by mouth 2 (two) times daily. 07/19/19   Dunn, Lisbeth Renshaw N, PA-C  insulin aspart (NOVOLOG FLEXPEN) 100 UNIT/ML FlexPen Inject 20 Units into the skin 3 (three) times daily with meals.     [provider]  insulin glargine (LANTUS) 100 unit/mL SOPN Inject 60 Units into the skin at bedtime.     [provider]  lisinopril (ZESTRIL) 20 MG tablet Take 1 tablet (20 mg total) by mouth daily. 08/23/19 03/06/20  Erline Hau, MD  LORazepam (ATIVAN) 1 MG tablet TAKE 1 TABLET BY MOUTH TWICE A DAY AS NEEDED FOR ANXIETY 11/14/19   Dutch Quint B, FNP  metFORMIN (GLUCOPHAGE) 1000 MG tablet Take 1,000 mg by mouth 2 (two) times daily with a meal.    [provider]  Multiple Vitamins-Minerals (CENTRUM SILVER PO) 1 tab po qd     [provider]  rosuvastatin (CRESTOR) 40 MG tablet Take 1 tablet (40 mg total) by mouth daily. 04/18/18   Burnell Blanks, MD  sertraline (ZOLOFT) 100 MG tablet Take 150 mg by mouth daily. Total of 150mg  daily    [provider]  triamcinolone ointment (KENALOG) 0.1 % Apply 1 application topically 2 (two) times daily. 07/27/19 07/26/20  Midge Minium, MD  vitamin B-12 (CYANOCOBALAMIN) 1000 MCG tablet Take 1,000 mcg by mouth daily.      [provider]  zolpidem (AMBIEN) 10 MG tablet Take 10 mg by mouth at bedtime as needed. For insomnia 04/24/12   Lisabeth Pick, MD    Physical Exam: Vitals:   03/08/20 1134 03/08/20 1144 03/08/20 1244 03/08/20 1300  BP:  (!) 96/52 123/86 (!) 149/56  Pulse:  68  64  Resp:  15  19  Temp:      TempSrc:      SpO2:  97%  98%  Weight: 101.2 kg     Height: 6' (1.829 m)       Constitutional: NAD, calm, comfortable Vitals:   03/08/20 1134 03/08/20 1144 03/08/20 1244 03/08/20 1300  BP:  (!) 96/52 123/86 (!) 149/56  Pulse:  68  64  Resp:  15  19  Temp:      TempSrc:      SpO2:  97%  98%  Weight: 101.2 kg     Height: 6' (1.829 m)      Eyes: PERRL, lids and conjunctivae normal ENMT: Mucous membranes are moist. Posterior pharynx clear of any exudate or lesions.Normal dentition.  Neck: normal, supple, no masses, no thyromegaly Respiratory: clear to auscultation bilaterally, no wheezing, no crackles. Normal respiratory effort. No accessory muscle use.  Cardiovascular: Regular rate and rhythm, no murmurs / rubs / gallops. No extremity edema. 2+ pedal pulses. No carotid bruits.  Abdomen: no tenderness, no masses palpated. No hepatosplenomegaly. Bowel sounds positive.  Musculoskeletal: no clubbing / cyanosis. No joint deformity upper and lower extremities. Good ROM, no contractures. Normal muscle tone.  Skin: no rashes, lesions, ulcers. No induration Neurologic: CN 2-12 grossly intact. Sensation intact, DTR normal.  Right lower extremity strength 4/5 compared to 5/5 on the left side.  Light touch sensation intact. Psychiatric: Normal judgment and insight. Alert and oriented x 3. Normal mood.     Labs on Admission: I have personally reviewed following labs  and imaging studies  CBC: Recent Labs  Lab 03/08/20 1142  WBC 7.2  NEUTROABS 3.8  HGB 13.8  HCT 42.2  MCV 89.0  PLT 944   Basic Metabolic Panel: Recent Labs  Lab 03/08/20 1142  NA 139  K 4.0  CL 103  CO2 24  GLUCOSE 229*  BUN 18  CREATININE 0.96  CALCIUM 9.5   GFR: Estimated Creatinine Clearance: 81.8 mL/min (by C-G formula based on SCr of 0.96 mg/dL). Liver Function Tests: Recent Labs  Lab 03/08/20 1142  AST 40  ALT 30  ALKPHOS 71  BILITOT 0.8  PROT 6.5  ALBUMIN 4.0   Recent Labs  Lab 03/08/20 1142  LIPASE 29   No results for input(s): AMMONIA in the last 168 hours. Coagulation Profile: No results for input(s): INR, PROTIME in the last 168 hours. Cardiac Enzymes: No results for input(s): CKTOTAL, CKMB, CKMBINDEX, TROPONINI in the last 168 hours. BNP (last 3 results) No results for input(s): PROBNP in the last 8760 hours. HbA1C: No results for input(s): HGBA1C in the last 72 hours. CBG: No results for input(s): GLUCAP in the last 168 hours. Lipid Profile: No results for input(s): CHOL, HDL, LDLCALC, TRIG, CHOLHDL, LDLDIRECT in the last 72 hours. Thyroid Function Tests: No results for input(s): TSH, T4TOTAL, FREET4, T3FREE, THYROIDAB in the last 72 hours. Anemia Panel: No results for input(s): VITAMINB12, FOLATE, FERRITIN, TIBC, IRON, RETICCTPCT in the last 72 hours. Urine analysis:    Component Value Date/Time   COLORURINE YELLOW 07/16/2012 [place] 07/16/2012 1345   LABSPEC 1.025 07/16/2012 1345   PHURINE 5.0 07/16/2012 1345   GLUCOSEU >1000 (A) 07/16/2012 1345   HGBUR NEGATIVE 07/16/2012 1345   BILIRUBINUR n 03/18/2015 0928   KETONESUR NEGATIVE 07/16/2012 1345   PROTEINUR n 03/18/2015 0928   PROTEINUR NEGATIVE 07/16/2012 1345   UROBILINOGEN 2.0 03/18/2015 0928   UROBILINOGEN 1.0 07/16/2012 1345  NITRITE n 03/18/2015 0928   NITRITE NEGATIVE 07/16/2012 1345   LEUKOCYTESUR Negative 03/18/2015 0928    Radiological Exams  on Admission: DG Chest Portable 1 View  Result Date: 03/08/2020 CLINICAL DATA:  Central chest pain radiating to back which shortness-of-breath and diaphoresis. EXAM: PORTABLE CHEST 1 VIEW COMPARISON:  11/11/2018 FINDINGS: Lungs are adequately inflated without focal airspace consolidation or effusion. Borderline stable cardiomegaly. Degenerative change of the spine. IMPRESSION: No acute cardiopulmonary disease. Electronically Signed   By: Marin Olp M.D.   On: 03/08/2020 12:31   CT Angio Chest/Abd/Pel for Dissection W and/or W/WO  Result Date: 03/08/2020 CLINICAL DATA:  Chest pain radiating to the back. Shortness of breath EXAM: CT ANGIOGRAPHY CHEST, ABDOMEN AND PELVIS TECHNIQUE: Non-contrast CT of the chest was initially obtained. Multidetector CT imaging through the chest, abdomen and pelvis was performed using the standard protocol during bolus administration of intravenous contrast. Multiplanar reconstructed images and MIPs were obtained and reviewed to evaluate the vascular anatomy. CONTRAST:  171mL OMNIPAQUE IOHEXOL 350 MG/ML SOLN COMPARISON:  CT 07/03/2015, 07/16/2012. FINDINGS: CTA CHEST FINDINGS Cardiovascular: Preferential opacification of the thoracic aorta. No evidence of thoracic aortic aneurysm or dissection. Precontrast images of the chest reveal no evidence of intramural hematoma. Three vessel arch. Atherosclerotic calcifications of the aorta and coronary arteries. Normal heart size. No pericardial effusion. Mediastinum/Nodes: No enlarged mediastinal, hilar, or axillary lymph nodes. Thyroid gland, trachea, and esophagus demonstrate no significant findings. Lungs/Pleura: Mild bibasilar subsegmental atelectasis. Mosaic attenuation within the mid to lower lung fields bilaterally. No focal airspace consolidation. No pleural effusion or pneumothorax. Musculoskeletal: No chest wall abnormality. No acute or significant osseous findings. Review of the MIP images confirms the above findings. CTA  ABDOMEN AND PELVIS FINDINGS VASCULAR Aorta: Normal caliber aorta without aneurysm, dissection, vasculitis or significant stenosis. Moderate calcified atherosclerotic plaque. Celiac: Patent without evidence of aneurysm, dissection, vasculitis or significant stenosis. SMA: Patent without evidence of aneurysm, dissection, vasculitis or significant stenosis. Renals: Both renal arteries are patent without evidence of aneurysm, dissection, vasculitis, fibromuscular dysplasia or significant stenosis. IMA: Patent. Inflow: Patent without evidence of aneurysm, dissection, vasculitis or significant stenosis. Moderate calcified atherosclerotic plaque. Veins: No obvious venous abnormality within the limitations of this arterial phase study. Review of the MIP images confirms the above findings. NON-VASCULAR Hepatobiliary: No focal liver abnormality is seen. Status post cholecystectomy. No biliary dilatation. Pancreas: Unremarkable. No pancreatic ductal dilatation or surrounding inflammatory changes. Spleen: Normal in size without focal abnormality. Adrenals/Urinary Tract: Unremarkable adrenal glands. Left upper pole renal cyst with layering high attenuation material has slightly increased in size from 2016, now measuring 3.9 cm and previously measuring 3.4 cm. Additional mid pole 2.9 cm left simple renal cyst has also slightly increased in size (previously measured 2.2 cm. The right kidney is unremarkable. No suspicious renal mass, stone, or hydronephrosis. Ureters and urinary bladder within normal limits. Stomach/Bowel: Stomach is within normal limits. Appendix appears normal (series 7, image 257). Scattered colonic diverticulosis. No evidence of bowel wall thickening, distention, or inflammatory changes. Lymphatic: No abdominopelvic lymphadenopathy. Reproductive: Prostate gland within normal limits. Other: No abdominal wall hernia or abnormality. No abdominopelvic ascites. Soft tissue density within the lower anterior abdominal  wall, slightly increased from prior, and may reflect injection related changes. Musculoskeletal: No acute or significant osseous findings. Review of the MIP images confirms the above findings. IMPRESSION: 1. Negative for aortic aneurysm or dissection. 2. Mosaic attenuation within the mid to lower lung fields bilaterally, which can be seen in the setting of  small airways disease. 3. No acute findings within the chest, abdomen, or pelvis. 4. Colonic diverticulosis without evidence of acute diverticulitis. 5. Aortic and coronary artery atherosclerosis. Electronically Signed   By: Davina Poke D.O.   On: 03/08/2020 13:56    EKG: Independently reviewed.  Normal sinus rhythm no acute ST-T changes  Assessment/Plan Active Problems:   * No active hospital problems. *  (please populate well all problems here in Problem List. (For example, if patient is on BP meds at home and you resume or decide to hold them, it is a problem that needs to be her. Same for CAD, COPD, HLD and so on)  Angina-like chest pain -Work-up so far negative for ACS, but patient has significant risk factors and his most recent stress test was more than 5 years ago. -Inpatient stress test as per cardiology -Continue his CAD medications  Acute right leg pain and weakness -Clinically suspect pressure palsy -Suspect there is a progress of his sciatica on top of diabetic neuropathy -Check a lumbar x-ray, recommend [facility] at [facility] for a putative injection -Lidocaine patch and PT evaluation -If symptoms persist, consider further work-up.   IDDM -No insulin coverage, hold p.o. diabetic medications in case patient will need IV contrast  HTN -Continue home meds  HLD -On statin  DVT prophylaxis: Lovenox Code Status: Full code Family Communication: Wife at bedside Disposition Plan: Likely can go home within 24 hours after cardiac work-up Consults called: Cardiology Admission status: Telemetry obs   Lequita Halt MD [facility] Pager [phone]   03/08/2020, 4:39 PM

## 2020-03-08 NOTE — ED Triage Notes (Signed)
Pt bib gcems from home w/ c/o sharp, constant, centrally located, 8/10 pain radiating toward back. Pt endorses sob, diaphoresis. Pt denies n/v. Pt has hx of MI, most recent being approx 10 mins. Pt received 1 nitro, 324 mg aspirin w/ no relief. EMS VSS, pt placed on 2 lpm O2 via [place] by EMS for comfort.

## 2020-03-08 NOTE — Progress Notes (Signed)
VASCULAR LAB    ABIs completed.    Preliminary report:  See CV proc for preliminary results.  Iam Lipson, RVT 03/08/2020, 5:06 PM

## 2020-03-08 NOTE — ED Notes (Signed)
NT in to notify writer that patient had fallen. Pt's wife, let railing down, allowed patient out of bed and patient then fell. Pt and wife were asked to use call bell for assistance prior to fall. Pt AOx4, ambulatory at baseline.

## 2020-03-08 NOTE — Progress Notes (Signed)
Pt arrived to the floor via the stretcher. Pt's right leg is not able to move. Bed weight obtained, CHG bath done and cardiac monitoring and High fall risk initiated. We'll continue to monitor.

## 2020-03-08 NOTE — ED Provider Notes (Signed)
Signout from Dr. Rex Kras.  74 year old male known coronary disease diabetic here with substernal chest pain radiating through the back associated with diaphoresis shortness of breath that started this morning while at rest.  He has had 2 - troponins and a negative CT angio chest abdomen and pelvis.  He is pending a cardiology consult.  He also newly has right leg cramping pain and difficulty using that started while he was in the ED today.  Denies prior history of same. Physical Exam  BP (!) 149/56   Pulse 64   Temp 98 F (36.7 C) (Oral)   Resp 19   Ht 6' (1.829 m)   Wt 101.2 kg   SpO2 98%   BMI 30.24 kg/m   Physical Exam  ED Course/Procedures     Procedures  MDM  Patient was seen by cardiology and they are recommending admission to the hospital service for likely some sort of cardiac testing tomorrow.  They also are recommending getting a arterial duplex of his right lower extremity because they are not clear on why he is having that symptom.  I went to examine the patient.  He has coolness on the right leg but intact distal pulses.  Seem to have a lot of reproducible tenderness around his right greater tuberosity.  Unable to lift the leg but unclear if his pain or true weakness as he is great muscle tone in that leg.  Almost seems to be resisting.  Intact distal reflexes.  Discussed with Dr. Roosevelt Locks from [facility] who will evaluate the patient for admission.       Hayden Rasmussen, MD 03/09/20 [phone]

## 2020-03-08 NOTE — ED Provider Notes (Signed)
[facility] EMERGENCY DEPARTMENT Provider Note   CSN: [id number] Arrival date & time: 03/08/20  1113     History Chief Complaint  Patient presents with  . Chest Pain    Brent [name]. is a 74 y.o. male.  74yo M w/ PMH including CAD s/p stenting, T2DM, HTN, HLD, OSA who p/w chest pain. Pt was in church this morning at rest and began having central chest pain that is sharp and later began radiating to back, associated w/ diaphoresis and SOB.  Pain has been severe and constant.  He denies any nausea or vomiting, abdominal pain, or recent illness.  No recent travel or history of blood clots.  He received aspirin and nitroglycerin by EMS, states no improvement in his pain.  The history is provided by the patient.  Chest Pain      Past Medical History:  Diagnosis Date  . Allergy   . CAD (coronary artery disease)    a. angioplasty of his RCA in 1990. b. bare metal stent placed in the RCA in 2000 followed by rotational atherectomy shortly after for stent restenosis. c. last cath was in 2012 showing stable moderate diffuse CAD. (70% mid LAD, 80% diagonal, 70% Ramus, 40% mid to distal RCA stent restenosis). d. Low risk nuc in 2015.  . Diabetes mellitus   . Diverticulosis   . Elevated CK   . Erectile dysfunction   . Hemorrhoids   . HTN (hypertension)   . Hyperlipidemia   . Hypertriglyceridemia   . Malignant melanoma of left side of neck ([place]) 10/25/2018  . Myocardial infarction ([place])   . Obesity   . OSA (obstructive sleep apnea)   . Persistent disorder of initiating or maintaining sleep   . Personal history of colonic polyps 02/05/2003    Patient Active Problem List   Diagnosis Date Noted  . Malignant melanoma of left side of neck ([place]) 10/25/2018  . Facial neuritis 10/25/2018  . Myofascial pain syndrome 10/25/2018  . Occipital neuralgia of left side 10/25/2018  . CAP (community acquired pneumonia) 09/08/2017  . Viral URI with cough 08/02/2017  . Male  hypogonadism 06/20/2016  . Renal lesion 06/20/2016  . Insomnia 08/07/2015  . Enlarged prostate with lower urinary tract symptoms (LUTS) 11/07/2014  . Balanitis 07/30/2012  . Bladder neck obstruction 07/30/2012  . Prostate nodule 06/18/2012  . Recurrent nephrolithiasis 06/18/2012  . Urinary urgency 06/18/2012  . Benign neoplasm of colon 08/29/2011  . DEPRESSION, SITUATIONAL, ACUTE 01/23/2010  . Essential hypertension 05/30/2009  . Coronary atherosclerosis 05/30/2009  . Obstructive sleep apnea 11/28/2008  . OBESITY 09/23/2008  . ERECTILE DYSFUNCTION 11/26/2007  . Well controlled type 2 diabetes mellitus with peripheral circulatory disorder ([place]) 05/09/2007  . Hyperlipidemia 05/09/2007  . MYOCARDIAL INFARCTION, HX OF 05/09/2007  . DIVERTICULOSIS, COLON 05/09/2007    Past Surgical History:  Procedure Laterality Date  . CHOLECYSTECTOMY    . CORONARY ANGIOPLASTY    . CORONARY STENT PLACEMENT     stenting of the right coronary artery with followup rotational  atherectomy. (3 stents placed)  . FINGER SURGERY     right  . FOOT SURGERY     right  . INGUINAL HERNIA REPAIR     right  . melanoma removal     neck  . ORTHOPEDIC SURGERY     foot right  . rotator cuff surg     Bil       Family History  Problem Relation Age of Onset  . Liver  cancer Father   . Lung cancer Father   . Heart disease Father   . Heart disease Sister   . Lung cancer Mother   . COPD Mother   . Obesity Mother   . Colon cancer Neg Hx   . Stomach cancer Neg Hx   . Pancreatic cancer Neg Hx     Social History   Tobacco Use  . Smoking status: Former Smoker    Packs/day: 1.50    Years: 30.00    Pack years: 45.00    Types: Cigarettes    Quit date: 09/19/1980    Years since quitting: 39.4  . Smokeless tobacco: Never Used  Vaping Use  . Vaping Use: Never used  Substance Use Topics  . Alcohol use: Yes    Alcohol/week: 7.0 - 14.0 standard drinks    Types: 7 - 14 Shots of liquor per week  . Drug  use: No    Home Medications Prior to Admission medications   Medication Sig Start Date End Date Taking? Authorizing Provider  aspirin 81 MG tablet Take 81 mg by mouth daily.    [provider]  benzonatate (TESSALON) 100 MG capsule Take 1 capsule (100 mg total) by mouth 2 (two) times daily as needed for cough. Patient not taking: Reported on 03/06/2020 12/19/19   [name], [name], MD  empagliflozin (JARDIANCE) 25 MG TABS tablet Take 12.5 mg by mouth daily.    [provider]  fenofibrate 160 MG tablet Take 160 mg by mouth daily.    [provider]  fluticasone (FLONASE) 50 MCG/ACT nasal spray SPRAY 2 SPRAYS INTO EACH NOSTRIL EVERY DAY 12/23/19   Young, Kasandra Knudsen, MD  Icosapent Ethyl (VASCEPA) 1 g CAPS Take 2 capsules (2 g total) by mouth 2 (two) times daily. 07/19/19   Dunn, Lisbeth Renshaw N, PA-C  insulin aspart (NOVOLOG FLEXPEN) 100 UNIT/ML FlexPen Inject 20 Units into the skin 3 (three) times daily with meals.     [provider]  insulin glargine (LANTUS) 100 unit/mL SOPN Inject 60 Units into the skin at bedtime.     [provider]  lisinopril (ZESTRIL) 20 MG tablet Take 1 tablet (20 mg total) by mouth daily. 08/23/19 03/06/20  Erline Hau, MD  LORazepam (ATIVAN) 1 MG tablet TAKE 1 TABLET BY MOUTH TWICE A DAY AS NEEDED FOR ANXIETY 11/14/19   Dutch Quint B, FNP  metFORMIN (GLUCOPHAGE) 1000 MG tablet Take 1,000 mg by mouth 2 (two) times daily with a meal.    [provider]  Multiple Vitamins-Minerals (CENTRUM SILVER PO) 1 tab po qd     [provider]  rosuvastatin (CRESTOR) 40 MG tablet Take 1 tablet (40 mg total) by mouth daily. 04/18/18   Burnell Blanks, MD  sertraline (ZOLOFT) 100 MG tablet Take 150 mg by mouth daily. Total of 150mg  daily    [provider]  triamcinolone ointment (KENALOG) 0.1 % Apply 1 application topically 2 (two) times daily. 07/27/19 07/26/20  Midge Minium, MD  vitamin  B-12 (CYANOCOBALAMIN) 1000 MCG tablet Take 1,000 mcg by mouth daily.      [provider]  zolpidem (AMBIEN) 10 MG tablet Take 10 mg by mouth at bedtime as needed. For insomnia 04/24/12   Swords, Darrick Penna, MD    Allergies    Penicillins  Review of Systems   Review of Systems  Cardiovascular: Positive for chest pain.   All other systems reviewed and are negative except that which was  mentioned in HPI  Physical Exam Updated Vital Signs BP (!) 149/56   Pulse 64   Temp 98 F (36.7 C) (Oral)   Resp 19   Ht 6' (1.829 m)   Wt 101.2 kg   SpO2 98%   BMI 30.24 kg/m   Physical Exam Vitals and nursing note reviewed.  Constitutional:      General: He is not in acute distress.    Appearance: He is well-developed.     Comments: uncomfortable  HENT:     Head: Normocephalic and atraumatic.  Eyes:     Conjunctiva/sclera: Conjunctivae normal.  Cardiovascular:     Rate and Rhythm: Normal rate and regular rhythm.     Heart sounds: Normal heart sounds. No murmur heard.   Pulmonary:     Effort: Pulmonary effort is normal.     Breath sounds: Normal breath sounds.  Abdominal:     General: Bowel sounds are normal. There is no distension.     Palpations: Abdomen is soft.     Tenderness: There is abdominal tenderness. There is no guarding or rebound.     Comments: midepigastric TTP, mild RUQ tenderness to deep palpation  Musculoskeletal:     Cervical back: Neck supple.     Right lower leg: No edema.     Left lower leg: No edema.  Skin:    General: Skin is warm and dry.  Neurological:     Mental Status: He is alert and oriented to person, place, and time.     Comments: Fluent speech  Psychiatric:        Judgment: Judgment normal.     Comments: Mildly anxious     ED Results / Procedures / Treatments   Labs (all labs ordered are listed, but only abnormal results are displayed) Labs Reviewed  COMPREHENSIVE METABOLIC PANEL - Abnormal; Notable for the following components:       Result Value   Glucose, Bld 229 (*)    All other components within normal limits  SARS CORONAVIRUS 2 BY RT PCR (HOSPITAL ORDER, [facility])  CBC WITH DIFFERENTIAL/PLATELET  LIPASE, BLOOD  TROPONIN I (HIGH SENSITIVITY)  TROPONIN I (HIGH SENSITIVITY)    EKG EKG Interpretation  Date/Time:  Sunday March 08 2020 12:27:15 EDT Ventricular Rate:  68 PR Interval:    QRS Duration: 103 QT Interval:  413 QTC Calculation: 440 R Axis:   46 Text Interpretation: Sinus rhythm Low voltage, precordial leads Probable anteroseptal infarct, old Abnormal T, consider ischemia, lateral leads No significant change since last tracing Confirmed by Theotis Burrow [id number]) on 03/08/2020 1:21:58 PM   Radiology DG Chest Portable 1 View  Result Date: 03/08/2020 CLINICAL DATA:  Central chest pain radiating to back which shortness-of-breath and diaphoresis. EXAM: PORTABLE CHEST 1 VIEW COMPARISON:  11/11/2018 FINDINGS: Lungs are adequately inflated without focal airspace consolidation or effusion. Borderline stable cardiomegaly. Degenerative change of the spine. IMPRESSION: No acute cardiopulmonary disease. Electronically Signed   By: Marin Olp M.D.   On: 03/08/2020 12:31   CT Angio Chest/Abd/Pel for Dissection W and/or W/WO  Result Date: 03/08/2020 CLINICAL DATA:  Chest pain radiating to the back. Shortness of breath EXAM: CT ANGIOGRAPHY CHEST, ABDOMEN AND PELVIS TECHNIQUE: Non-contrast CT of the chest was initially obtained. Multidetector CT imaging through the chest, abdomen and pelvis was performed using the standard protocol during bolus administration of intravenous contrast. Multiplanar reconstructed images and MIPs were obtained and reviewed to evaluate the vascular anatomy. CONTRAST:  132mL OMNIPAQUE  IOHEXOL 350 MG/ML SOLN COMPARISON:  CT 07/03/2015, 07/16/2012. FINDINGS: CTA CHEST FINDINGS Cardiovascular: Preferential opacification of the thoracic aorta. No evidence of thoracic  aortic aneurysm or dissection. Precontrast images of the chest reveal no evidence of intramural hematoma. Three vessel arch. Atherosclerotic calcifications of the aorta and coronary arteries. Normal heart size. No pericardial effusion. Mediastinum/Nodes: No enlarged mediastinal, hilar, or axillary lymph nodes. Thyroid gland, trachea, and esophagus demonstrate no significant findings. Lungs/Pleura: Mild bibasilar subsegmental atelectasis. Mosaic attenuation within the mid to lower lung fields bilaterally. No focal airspace consolidation. No pleural effusion or pneumothorax. Musculoskeletal: No chest wall abnormality. No acute or significant osseous findings. Review of the MIP images confirms the above findings. CTA ABDOMEN AND PELVIS FINDINGS VASCULAR Aorta: Normal caliber aorta without aneurysm, dissection, vasculitis or significant stenosis. Moderate calcified atherosclerotic plaque. Celiac: Patent without evidence of aneurysm, dissection, vasculitis or significant stenosis. SMA: Patent without evidence of aneurysm, dissection, vasculitis or significant stenosis. Renals: Both renal arteries are patent without evidence of aneurysm, dissection, vasculitis, fibromuscular dysplasia or significant stenosis. IMA: Patent. Inflow: Patent without evidence of aneurysm, dissection, vasculitis or significant stenosis. Moderate calcified atherosclerotic plaque. Veins: No obvious venous abnormality within the limitations of this arterial phase study. Review of the MIP images confirms the above findings. NON-VASCULAR Hepatobiliary: No focal liver abnormality is seen. Status post cholecystectomy. No biliary dilatation. Pancreas: Unremarkable. No pancreatic ductal dilatation or surrounding inflammatory changes. Spleen: Normal in size without focal abnormality. Adrenals/Urinary Tract: Unremarkable adrenal glands. Left upper pole renal cyst with layering high attenuation material has slightly increased in size from 2016, now  measuring 3.9 cm and previously measuring 3.4 cm. Additional mid pole 2.9 cm left simple renal cyst has also slightly increased in size (previously measured 2.2 cm. The right kidney is unremarkable. No suspicious renal mass, stone, or hydronephrosis. Ureters and urinary bladder within normal limits. Stomach/Bowel: Stomach is within normal limits. Appendix appears normal (series 7, image 257). Scattered colonic diverticulosis. No evidence of bowel wall thickening, distention, or inflammatory changes. Lymphatic: No abdominopelvic lymphadenopathy. Reproductive: Prostate gland within normal limits. Other: No abdominal wall hernia or abnormality. No abdominopelvic ascites. Soft tissue density within the lower anterior abdominal wall, slightly increased from prior, and may reflect injection related changes. Musculoskeletal: No acute or significant osseous findings. Review of the MIP images confirms the above findings. IMPRESSION: 1. Negative for aortic aneurysm or dissection. 2. Mosaic attenuation within the mid to lower lung fields bilaterally, which can be seen in the setting of small airways disease. 3. No acute findings within the chest, abdomen, or pelvis. 4. Colonic diverticulosis without evidence of acute diverticulitis. 5. Aortic and coronary artery atherosclerosis. Electronically Signed   By: Davina Poke D.O.   On: 03/08/2020 13:56    Procedures Procedures (including critical care time)  Medications Ordered in ED Medications  nitroGLYCERIN (NITROSTAT) SL tablet 0.4 mg (0.4 mg Sublingual Given 03/08/20 1141)  LORazepam (ATIVAN) injection 1 mg (has no administration in time range)  fentaNYL (SUBLIMAZE) injection 100 mcg (100 mcg Intravenous Given 03/08/20 1211)  sodium chloride 0.9 % bolus 500 mL (0 mLs Intravenous Stopped 03/08/20 1503)  morphine 4 MG/ML injection 4 mg (4 mg Intravenous Given 03/08/20 1332)  iohexol (OMNIPAQUE) 350 MG/ML injection 100 mL (100 mLs Intravenous Contrast Given 03/08/20  1320)    ED Course  I have reviewed the triage vital signs and the nursing notes.  Pertinent labs & imaging results that were available during my care of the patient were reviewed by me and  considered in my medical decision making (see chart for details).    MDM Rules/Calculators/A&P                          Uncomfortable on arrival, vital signs reassuring.  EKG without acute ischemic changes.  No improvement with nitroglycerin and pressure became soft therefore gave fentanyl.  Lab work shows reassuring CMP and CBC, negative initial troponin.  Because of sudden onset of pain, sharp nature of pain radiating to back, obtain CTA to rule out dissection.  Patient later began complaining of right leg numbness which was even more compelling reason to rule out dissection.  Thankfully, CTA is negative for aortic dissection or any other acute process in chest, abdomen, or pelvis.  Serial troponins negative.  On reassessment, the patient is stating that he cannot move his leg.  On exam, he struggles to lift right leg off the bed however has overall INCREASED muscle tone, resisting my attempts to flex at hip at knee and not allowing me to dorsiflex his foot for clonus testing. He is hyperreflexic at patella. Compared to L leg. He endorses some cramping sensations in his calf. No lower back pain/sciatica symptoms. No actual flaccid paralysis to suggest acute stroke. Discussed briefly with neurology, Dr. Rory Percy, who recommended trial of muscle relaxant; I ordered IV ativan.  Regarding his chest pain, given his extensive cardiac history and no recent stress testing or catheterization, I spoke with Dr. Gardiner Rhyme who will evaluate pt in ED. PT signed out to oncoming provider pending cardiology recs and recheck after ativan.  Final Clinical Impression(s) / ED Diagnoses Final diagnoses:  None    Rx / DC Orders ED Discharge Orders    None       Danayah Smyre, Wenda Overland, MD 03/08/20 1540

## 2020-03-09 ENCOUNTER — Inpatient Hospital Stay (HOSPITAL_COMMUNITY): Payer: Medicare Other

## 2020-03-09 ENCOUNTER — Encounter (HOSPITAL_COMMUNITY): Payer: Self-pay | Admitting: Internal Medicine

## 2020-03-09 ENCOUNTER — Observation Stay (HOSPITAL_COMMUNITY): Payer: Medicare Other

## 2020-03-09 ENCOUNTER — Other Ambulatory Visit: Payer: Self-pay

## 2020-03-09 DIAGNOSIS — F419 Anxiety disorder, unspecified: Secondary | ICD-10-CM | POA: Diagnosis present

## 2020-03-09 DIAGNOSIS — J189 Pneumonia, unspecified organism: Secondary | ICD-10-CM | POA: Diagnosis not present

## 2020-03-09 DIAGNOSIS — G373 Acute transverse myelitis in demyelinating disease of central nervous system: Secondary | ICD-10-CM | POA: Diagnosis not present

## 2020-03-09 DIAGNOSIS — Z794 Long term (current) use of insulin: Secondary | ICD-10-CM | POA: Diagnosis not present

## 2020-03-09 DIAGNOSIS — I2510 Atherosclerotic heart disease of native coronary artery without angina pectoris: Secondary | ICD-10-CM | POA: Diagnosis present

## 2020-03-09 DIAGNOSIS — G8220 Paraplegia, unspecified: Secondary | ICD-10-CM | POA: Diagnosis not present

## 2020-03-09 DIAGNOSIS — N179 Acute kidney failure, unspecified: Secondary | ICD-10-CM | POA: Diagnosis not present

## 2020-03-09 DIAGNOSIS — E871 Hypo-osmolality and hyponatremia: Secondary | ICD-10-CM | POA: Diagnosis not present

## 2020-03-09 DIAGNOSIS — R200 Anesthesia of skin: Secondary | ICD-10-CM

## 2020-03-09 DIAGNOSIS — D62 Acute posthemorrhagic anemia: Secondary | ICD-10-CM | POA: Diagnosis not present

## 2020-03-09 DIAGNOSIS — E1142 Type 2 diabetes mellitus with diabetic polyneuropathy: Secondary | ICD-10-CM | POA: Diagnosis present

## 2020-03-09 DIAGNOSIS — J9621 Acute and chronic respiratory failure with hypoxia: Secondary | ICD-10-CM | POA: Diagnosis not present

## 2020-03-09 DIAGNOSIS — R531 Weakness: Secondary | ICD-10-CM | POA: Diagnosis not present

## 2020-03-09 DIAGNOSIS — R079 Chest pain, unspecified: Secondary | ICD-10-CM | POA: Diagnosis not present

## 2020-03-09 DIAGNOSIS — J690 Pneumonitis due to inhalation of food and vomit: Secondary | ICD-10-CM | POA: Diagnosis not present

## 2020-03-09 DIAGNOSIS — I252 Old myocardial infarction: Secondary | ICD-10-CM | POA: Diagnosis not present

## 2020-03-09 DIAGNOSIS — I639 Cerebral infarction, unspecified: Secondary | ICD-10-CM | POA: Diagnosis not present

## 2020-03-09 DIAGNOSIS — E860 Dehydration: Secondary | ICD-10-CM | POA: Diagnosis not present

## 2020-03-09 DIAGNOSIS — R252 Cramp and spasm: Secondary | ICD-10-CM

## 2020-03-09 DIAGNOSIS — E781 Pure hyperglyceridemia: Secondary | ICD-10-CM | POA: Diagnosis present

## 2020-03-09 DIAGNOSIS — I10 Essential (primary) hypertension: Secondary | ICD-10-CM | POA: Diagnosis present

## 2020-03-09 DIAGNOSIS — G4700 Insomnia, unspecified: Secondary | ICD-10-CM | POA: Diagnosis present

## 2020-03-09 DIAGNOSIS — L89153 Pressure ulcer of sacral region, stage 3: Secondary | ICD-10-CM | POA: Diagnosis not present

## 2020-03-09 DIAGNOSIS — K592 Neurogenic bowel, not elsewhere classified: Secondary | ICD-10-CM | POA: Diagnosis not present

## 2020-03-09 DIAGNOSIS — Y95 Nosocomial condition: Secondary | ICD-10-CM | POA: Diagnosis not present

## 2020-03-09 DIAGNOSIS — R739 Hyperglycemia, unspecified: Secondary | ICD-10-CM | POA: Diagnosis not present

## 2020-03-09 DIAGNOSIS — N319 Neuromuscular dysfunction of bladder, unspecified: Secondary | ICD-10-CM | POA: Diagnosis not present

## 2020-03-09 DIAGNOSIS — M21332 Wrist drop, left wrist: Secondary | ICD-10-CM | POA: Diagnosis not present

## 2020-03-09 DIAGNOSIS — M21331 Wrist drop, right wrist: Secondary | ICD-10-CM | POA: Diagnosis not present

## 2020-03-09 DIAGNOSIS — G8250 Quadriplegia, unspecified: Secondary | ICD-10-CM | POA: Diagnosis not present

## 2020-03-09 DIAGNOSIS — J449 Chronic obstructive pulmonary disease, unspecified: Secondary | ICD-10-CM | POA: Diagnosis present

## 2020-03-09 DIAGNOSIS — G360 Neuromyelitis optica [Devic]: Secondary | ICD-10-CM | POA: Diagnosis present

## 2020-03-09 DIAGNOSIS — T380X5A Adverse effect of glucocorticoids and synthetic analogues, initial encounter: Secondary | ICD-10-CM | POA: Diagnosis not present

## 2020-03-09 DIAGNOSIS — R0789 Other chest pain: Secondary | ICD-10-CM | POA: Diagnosis present

## 2020-03-09 DIAGNOSIS — R0682 Tachypnea, not elsewhere classified: Secondary | ICD-10-CM | POA: Diagnosis not present

## 2020-03-09 DIAGNOSIS — J9601 Acute respiratory failure with hypoxia: Secondary | ICD-10-CM | POA: Diagnosis not present

## 2020-03-09 DIAGNOSIS — E1165 Type 2 diabetes mellitus with hyperglycemia: Secondary | ICD-10-CM | POA: Diagnosis not present

## 2020-03-09 DIAGNOSIS — M545 Low back pain: Secondary | ICD-10-CM | POA: Diagnosis present

## 2020-03-09 DIAGNOSIS — G4733 Obstructive sleep apnea (adult) (pediatric): Secondary | ICD-10-CM | POA: Diagnosis present

## 2020-03-09 DIAGNOSIS — J9811 Atelectasis: Secondary | ICD-10-CM | POA: Diagnosis not present

## 2020-03-09 DIAGNOSIS — G9511 Acute infarction of spinal cord (embolic) (nonembolic): Secondary | ICD-10-CM | POA: Diagnosis not present

## 2020-03-09 DIAGNOSIS — E669 Obesity, unspecified: Secondary | ICD-10-CM | POA: Diagnosis present

## 2020-03-09 DIAGNOSIS — Z20822 Contact with and (suspected) exposure to covid-19: Secondary | ICD-10-CM | POA: Diagnosis not present

## 2020-03-09 DIAGNOSIS — E785 Hyperlipidemia, unspecified: Secondary | ICD-10-CM | POA: Diagnosis present

## 2020-03-09 LAB — NM MYOCAR MULTI W/SPECT W/WALL MOTION / EF
Estimated workload: 1 METS
Exercise duration (min): 0 min
Exercise duration (sec): 0 s
MPHR: 145 {beats}/min
Peak HR: 105 {beats}/min
Percent HR: 72 %
Rest HR: 73 {beats}/min

## 2020-03-09 LAB — GLUCOSE, CAPILLARY
Glucose-Capillary: 182 mg/dL — ABNORMAL HIGH (ref 70–99)
Glucose-Capillary: 182 mg/dL — ABNORMAL HIGH (ref 70–99)
Glucose-Capillary: 224 mg/dL — ABNORMAL HIGH (ref 70–99)
Glucose-Capillary: 155 mg/dL — ABNORMAL HIGH (ref 70–99)

## 2020-03-09 LAB — FOLATE: Folate: 45.6 ng/mL

## 2020-03-09 LAB — HEMOGLOBIN A1C
Hgb A1c MFr Bld: 6.7 % — ABNORMAL HIGH (ref 4.8–5.6)
Mean Plasma Glucose: 146 mg/dL

## 2020-03-09 LAB — VITAMIN B12: Vitamin B-12: 714 pg/mL (ref 180–914)

## 2020-03-09 MED ORDER — KETOROLAC TROMETHAMINE 15 MG/ML IJ SOLN
15.0000 mg | Freq: Four times a day (QID) | INTRAMUSCULAR | Status: DC | PRN
  Administered 2020-03-09: 15 mg via INTRAVENOUS

## 2020-03-09 MED ORDER — REGADENOSON 0.4 MG/5ML IV SOLN
0.4000 mg | Freq: Once | INTRAVENOUS | Status: AC
  Administered 2020-03-09: 0.4 mg via INTRAVENOUS
  Filled 2020-03-09: qty 5

## 2020-03-09 MED ORDER — TECHNETIUM TC 99M TETROFOSMIN IV KIT
29.0000 | PACK | Freq: Once | INTRAVENOUS | Status: AC | PRN
  Administered 2020-03-09: 29 via INTRAVENOUS

## 2020-03-09 MED ORDER — LORAZEPAM 2 MG/ML IJ SOLN
1.0000 mg | Freq: Once | INTRAMUSCULAR | Status: AC
  Administered 2020-03-09: 1 mg via INTRAVENOUS
  Filled 2020-03-09: qty 1

## 2020-03-09 MED ORDER — TECHNETIUM TC 99M TETROFOSMIN IV KIT
11.0000 | PACK | Freq: Once | INTRAVENOUS | Status: AC | PRN
  Administered 2020-03-09: 11 via INTRAVENOUS

## 2020-03-09 NOTE — Progress Notes (Signed)
MRI notified this RN patient is claustrophobic. Notified NP Sharlet Salina. Ordered one time dose of Ativan IV 1mg .  Paulene Floor, RN

## 2020-03-09 NOTE — Plan of Care (Signed)
POC initiated and progressing. 

## 2020-03-09 NOTE — Progress Notes (Signed)
PT Cancellation Note  Patient Details Name: Brent [name]. MRN: [id number] DOB: 1946-03-14   Cancelled Treatment:    Reason Eval/Treat Not Completed: Patient at procedure or test/unavailable   Shary Decamp [facility] 03/09/2020, 11:56 AM [place] Pager [phone] Office [phone]

## 2020-03-09 NOTE — Progress Notes (Signed)
This RN is present of Nuc Med Stress test. Prior to test it was noted that the patient is experienceing profound weakness in the bilateral upper and lower extremities. He could barley grip the pen. Sande Rives, PA is present at the bedside. Modified NIH screening completed. The patient has sensation but loss of movement in bilateral lower extremities and is unable to grip or squeeze bilateral upper extremities. Further investigation on these findings will be addressed by the PA.    The patient has received Lexiscan dose. He experienced mild nausea without vomiting. No other complications or distress. The appropriate staff was present during this test.

## 2020-03-09 NOTE — Progress Notes (Signed)
PT Cancellation Note  Patient Details Name: Brent [name]. MRN: [id number] DOB: 01-04-1946   Cancelled Treatment:    Reason Eval/Treat Not Completed: Patient at procedure or test/unavailable. Pt still in nuclear med.    Shary Decamp [place] 03/09/2020, 2:16 PM [place] Pager [phone] Office [phone]

## 2020-03-09 NOTE — Progress Notes (Signed)
PROGRESS NOTE    Brent [name].  DTO:[id number] DOB: [birth year] DOA: 03/08/2020 PCP: [name], [name], MD    Brief Narrative:  74 y.o. male with medical history significant of remote MI stenting x3 in 1990s, CAD, IDDM, DM neuropathy, sciatica with injection treatment 2 years ago, HTN, HLD, came with new onset of chest pain.  Chest pain happened about patient was in the church and standing.  Pressure-like 8-9 over 10, associated with feeling of nausea and sweating no vomiting no short of breath no palpitations.  EMS arrived and gave patient some aspirin nitroglycerin sublingual with little improvement.  Then upon arrival in the ED the chest pain slowly progressed to radiate to the left shoulder and his back, then slowly subsided he estimated the whole process about an hour.  While staying in the ED patient started to feel weakness of the whole right-sided leg, he remember that while lying on the stretcher in the ambulance, the stretcher had a bump right beneath his right hip area, where he had " bad inflamed nerve which received injection about 2 years ago in [place]".  Although his right leg/buttock symptoms 2 years ago was mainly pain and no weakness or numbness of the right leg compared to this time he feels the whole right leg very discomfort but denies any numbness or loss of feeling but only weakness from hip to toe on the right side.  He denied baseline claudication, he does have chronic back pain on and off.  Patient also admitted he has a baseline diabetic neuropathy with frequent on and off tingling sensation of toes and fingertips. ED Course: Troponin II sets negative, CTA dissection study negative.  Cardiology recommended TBI study to rule out acute right lower extremity ischemia which came back negative.  Assessment & Plan:   Active Problems:   Chest pain   Weakness  Chest pain -Pt has significant prior cardiac history -Given risk factors, Cardiology consulted and pt  underwent nuclear stress test on 6/21, reviewed, with no reversible ischemia or infarct. LVEF of 53%, normal LV wall motion. Low risk study -Continue his CAD medications as tolerated  Progressive BLE weakness, numbness, loss of B hand grip strength -new findings since initial presentation -Symptoms initially with RLE, since progressing to involve BLE weakness and B grip strength. Also sensory deficit from nipple line down to feet -Have consulted Neurology for assistance  IDDM -Continue to hold p.o. diabetic medications  -Continue with SSI coverage as needed  HTN -Continue home meds -BP stable and controlled  HLD -Continue with statin as tolerated  Obesity -Recommend diet/lifestyle modification  Hx malignant melanoma -chart reviewed. Pt s/p biopsy on 10/19, followed by [facility] -Seems to be stable at this time  DVT prophylaxis: Lovenox subq Code Status: Full Family Communication: Pt in room, family at bedside  Status is: Inpatient  Remains inpatient appropriate because:Ongoing diagnostic testing needed not appropriate for outpatient work up and Inpatient level of care appropriate due to severity of illness   Dispo: The patient is from: Home              Anticipated d/c is to: Given marked weakness, will need PT eval to establish appropriate disposition              Anticipated d/c date is: 3 days              Patient currently is not medically stable to d/c.       Consultants:   Cardiology  Neurology  Procedures:   Stress test 6/21  Antimicrobials: Anti-infectives (From admission, onward)   None       Subjective: Noting progressive BLE weakness and numbness extending from nipple line down, also worsening B grip strength  Objective: Vitals:   03/09/20 1346 03/09/20 1347 03/09/20 1534 03/09/20 1739  BP: (!) 104/59 140/65 (!) 111/58 111/65  Pulse: 100 97 72   Resp:   14 19  Temp:   98.2 F (36.8 C)   TempSrc:   Oral   SpO2:   98% (P) 98%  Weight:       Height:        Intake/Output Summary (Last 24 hours) at 03/09/2020 1742 Last data filed at 03/09/2020 0418 Gross per 24 hour  Intake --  Output 925 ml  Net -925 ml   Filed Weights   03/08/20 1134 03/08/20 2044  Weight: 101.2 kg 98.4 kg    Examination: General exam: Appears calm and comfortable  Respiratory system: Clear to auscultation. Respiratory effort normal. Cardiovascular system: S1 & S2 heard, Regular Gastrointestinal system: Abdomen is nondistended, soft and nontender. No organomegaly or masses felt. Normal bowel sounds heard. Central nervous system: Alert and oriented. Numbness from nipple line down to feet, BLE weakness, loss of grip strength Extremities: Symmetric 5 x 5 power. Skin: No rashes, lesions Psychiatry: Judgement and insight appear normal. Mood & affect appropriate.   Data Reviewed: I have personally reviewed following labs and imaging studies  CBC: Recent Labs  Lab 03/08/20 1142  WBC 7.2  NEUTROABS 3.8  HGB 13.8  HCT 42.2  MCV 89.0  PLT 354   Basic Metabolic Panel: Recent Labs  Lab 03/08/20 1142  NA 139  K 4.0  CL 103  CO2 24  GLUCOSE 229*  BUN 18  CREATININE 0.96  CALCIUM 9.5   GFR: Estimated Creatinine Clearance: 80.8 mL/min (by C-G formula based on SCr of 0.96 mg/dL). Liver Function Tests: Recent Labs  Lab 03/08/20 1142  AST 40  ALT 30  ALKPHOS 71  BILITOT 0.8  PROT 6.5  ALBUMIN 4.0   Recent Labs  Lab 03/08/20 1142  LIPASE 29   No results for input(s): AMMONIA in the last 168 hours. Coagulation Profile: No results for input(s): INR, PROTIME in the last 168 hours. Cardiac Enzymes: No results for input(s): CKTOTAL, CKMB, CKMBINDEX, TROPONINI in the last 168 hours. BNP (last 3 results) No results for input(s): PROBNP in the last 8760 hours. HbA1C: No results for input(s): HGBA1C in the last 72 hours. CBG: Recent Labs  Lab 03/08/20 1917 03/08/20 2112 03/09/20 0615 03/09/20 1054 03/09/20 1636  GLUCAP 111* 210*  182* 182* 224*   Lipid Profile: No results for input(s): CHOL, HDL, LDLCALC, TRIG, CHOLHDL, LDLDIRECT in the last 72 hours. Thyroid Function Tests: No results for input(s): TSH, T4TOTAL, FREET4, T3FREE, THYROIDAB in the last 72 hours. Anemia Panel: No results for input(s): VITAMINB12, FOLATE, FERRITIN, TIBC, IRON, RETICCTPCT in the last 72 hours. Sepsis Labs: No results for input(s): PROCALCITON, LATICACIDVEN in the last 168 hours.  Recent Results (from the past 240 hour(s))  SARS Coronavirus 2 by RT PCR (hospital order, performed in [facility] hospital lab) Nasopharyngeal Nasopharyngeal Swab     Status: None   Collection Time: 03/08/20 11:33 AM   Specimen: Nasopharyngeal Swab  Result Value Ref Range Status   SARS Coronavirus 2 NEGATIVE NEGATIVE Final    Comment: (NOTE) SARS-CoV-2 target nucleic acids are NOT DETECTED.  The SARS-CoV-2 RNA is generally detectable in upper  and lower respiratory specimens during the acute phase of infection. The lowest concentration of SARS-CoV-2 viral copies this assay can detect is 250 copies / mL. A negative result does not preclude SARS-CoV-2 infection and should not be used as the sole basis for treatment or other patient management decisions.  A negative result may occur with improper specimen collection / handling, submission of specimen other than nasopharyngeal swab, presence of viral mutation(s) within the areas targeted by this assay, and inadequate number of viral copies (<250 copies / mL). A negative result must be combined with clinical observations, patient history, and epidemiological information.  Fact Sheet for Patients:   StrictlyIdeas.no  Fact Sheet for Healthcare Providers: BankingDealers.co.za  This test is not yet approved or  cleared by the [place] FDA and has been authorized for detection and/or diagnosis of SARS-CoV-2 by FDA under an Emergency Use Authorization (EUA).   This EUA will remain in effect (meaning this test can be used) for the duration of the COVID-19 declaration under Section 564(b)(1) of the Act, 21 U.S.C. section 360bbb-3(b)(1), unless the authorization is terminated or revoked sooner.  Performed at [facility], [place] [address] [place], [place] [address]      Radiology Studies: DG Lumbar Spine 2-3 Views  Result Date: 03/08/2020 CLINICAL DATA:  74 year old male with right lower extremity numbness. EXAM: LUMBAR SPINE - 2-3 VIEW COMPARISON:  Abdominal radiograph dated 11/30/2016 and CT dated 03/08/2020. FINDINGS: Five lumbar type vertebra. There is no acute fracture or subluxation of the lumbar spine. The bones are osteopenic. Multilevel degenerative changes with spurring. There is disc space narrowing at L4-L5. The visualized posterior elements are intact. There is atherosclerotic calcification of the abdominal aorta. Partially visualized high attenuating structure in the left upper abdomen corresponds to the cyst seen on the earlier CT. Excreted contrast noted within the urinary bladder. Linear density over the left lower abdomen may be artifactual and superimposed on the patient. IMPRESSION: 1. No acute fracture or subluxation of the lumbar spine. 2. Osteopenia with multilevel degenerative changes. Electronically Signed   By: Anner Crete M.D.   On: 03/08/2020 19:03   NM Myocar Multi W/Spect W/Wall Motion / EF  Result Date: 03/09/2020 CLINICAL DATA:  Recent onset of chest pain. History diabetes, remote myocardial infarction and coronary stenting. EXAM: MYOCARDIAL IMAGING WITH SPECT (REST AND PHARMACOLOGIC-STRESS) GATED LEFT VENTRICULAR WALL MOTION STUDY LEFT VENTRICULAR EJECTION FRACTION TECHNIQUE: Standard myocardial SPECT imaging was performed after resting intravenous injection of 11 mCi Tc-12m tetrofosmin. Subsequently, intravenous infusion of Lexiscan was performed under the supervision of the Cardiology staff. At peak effect of the  drug, 29 mCi Tc-53m tetrofosmin was injected intravenously and standard myocardial SPECT imaging was performed. Quantitative gated imaging was also performed to evaluate left ventricular wall motion, and estimate left ventricular ejection fraction. COMPARISON:  Images from nuclear medicine myocardial perfusion study 09/25/2013 without report FINDINGS: Perfusion: No decreased activity in the left ventricle on stress imaging to suggest reversible ischemia or infarction. Mildly prominent subdiaphragmatic activity on the rest images. Wall Motion: Normal left ventricular wall motion. No left ventricular dilation. Left Ventricular Ejection Fraction: 53 % End diastolic volume 91 ml End systolic volume 43 ml IMPRESSION: 1. No reversible ischemia or infarction. 2. Normal left ventricular wall motion. 3. Left ventricular ejection fraction 53% 4. Non invasive risk stratification*: Low *2012 Appropriate Use Criteria for Coronary Revascularization Focused Update: J Am Coll Cardiol. 8527;78(2):423-536. http://content.airportbarriers.com.aspx?articleid=[email or web address] Electronically Signed   By: Richardean Sale M.D.   On: 03/09/2020  16:43   DG Chest Portable 1 View  Result Date: 03/08/2020 CLINICAL DATA:  Central chest pain radiating to back which shortness-of-breath and diaphoresis. EXAM: PORTABLE CHEST 1 VIEW COMPARISON:  11/11/2018 FINDINGS: Lungs are adequately inflated without focal airspace consolidation or effusion. Borderline stable cardiomegaly. Degenerative change of the spine. IMPRESSION: No acute cardiopulmonary disease. Electronically Signed   By: Marin Olp M.D.   On: 03/08/2020 12:31   VAS [place] ABI WITH/WO TBI  Result Date: 03/09/2020 LOWER EXTREMITY DOPPLER STUDY Indications: Cold right foot, unable to move right leg, hip pain. High Risk Factors: Hypertension, hyperlipidemia, Diabetes, past history of                    smoking, coronary artery disease. Other Factors: History of lumbar issues.  Comparison  Study: No prior study on file Performing Technologist: Sharion Dove RVS  Examination Guidelines: A complete evaluation includes at minimum, Doppler waveform signals and systolic blood pressure reading at the level of bilateral brachial, anterior tibial, and posterior tibial arteries, when vessel segments are accessible. Bilateral testing is considered an integral part of a complete examination. Photoelectric Plethysmograph (PPG) waveforms and toe systolic pressure readings are included as required and additional duplex testing as needed. Limited examinations for reoccurring indications may be performed as noted.  ABI Findings: +---------+------------------+-----+-----------+--------+ Right    Rt Pressure (mmHg)IndexWaveform   Comment  +---------+------------------+-----+-----------+--------+ Brachial 161                    triphasic           +---------+------------------+-----+-----------+--------+ Popliteal                       multiphasic         +---------+------------------+-----+-----------+--------+ PTA      197               1.22 multiphasic         +---------+------------------+-----+-----------+--------+ DP       201               1.25 multiphasic         +---------+------------------+-----+-----------+--------+ Great Toe122               0.76                     +---------+------------------+-----+-----------+--------+ +---------+------------------+-----+-----------+-------+ Left     Lt Pressure (mmHg)IndexWaveform   Comment +---------+------------------+-----+-----------+-------+ Brachial 144                    triphasic          +---------+------------------+-----+-----------+-------+ PTA      196               1.22 multiphasic        +---------+------------------+-----+-----------+-------+ DP       191               1.19 multiphasic        +---------+------------------+-----+-----------+-------+ Great Toe115               0.71                     +---------+------------------+-----+-----------+-------+ +-------+-----------+-----------+------------+------------+ ABI/TBIToday's ABIToday's TBIPrevious ABIPrevious TBI +-------+-----------+-----------+------------+------------+ Right  1.2        0.76                                +-------+-----------+-----------+------------+------------+  Left   1.2        0.71                                +-------+-----------+-----------+------------+------------+  Summary: Right: Resting right ankle-brachial index is within normal range. No evidence of significant right lower extremity arterial disease. The right toe-brachial index is normal. Left: Resting left ankle-brachial index is within normal range. No evidence of significant left lower extremity arterial disease. The left toe-brachial index is normal.  *See table(s) above for measurements and observations.  Electronically signed by Ruta Hinds MD on 03/09/2020 at 3:38:09 PM.    Final    CT Angio Chest/Abd/Pel for Dissection W and/or W/WO  Result Date: 03/08/2020 CLINICAL DATA:  Chest pain radiating to the back. Shortness of breath EXAM: CT ANGIOGRAPHY CHEST, ABDOMEN AND PELVIS TECHNIQUE: Non-contrast CT of the chest was initially obtained. Multidetector CT imaging through the chest, abdomen and pelvis was performed using the standard protocol during bolus administration of intravenous contrast. Multiplanar reconstructed images and MIPs were obtained and reviewed to evaluate the vascular anatomy. CONTRAST:  151mL OMNIPAQUE IOHEXOL 350 MG/ML SOLN COMPARISON:  CT 07/03/2015, 07/16/2012. FINDINGS: CTA CHEST FINDINGS Cardiovascular: Preferential opacification of the thoracic aorta. No evidence of thoracic aortic aneurysm or dissection. Precontrast images of the chest reveal no evidence of intramural hematoma. Three vessel arch. Atherosclerotic calcifications of the aorta and coronary arteries. Normal heart size. No pericardial effusion.  Mediastinum/Nodes: No enlarged mediastinal, hilar, or axillary lymph nodes. Thyroid gland, trachea, and esophagus demonstrate no significant findings. Lungs/Pleura: Mild bibasilar subsegmental atelectasis. Mosaic attenuation within the mid to lower lung fields bilaterally. No focal airspace consolidation. No pleural effusion or pneumothorax. Musculoskeletal: No chest wall abnormality. No acute or significant osseous findings. Review of the MIP images confirms the above findings. CTA ABDOMEN AND PELVIS FINDINGS VASCULAR Aorta: Normal caliber aorta without aneurysm, dissection, vasculitis or significant stenosis. Moderate calcified atherosclerotic plaque. Celiac: Patent without evidence of aneurysm, dissection, vasculitis or significant stenosis. SMA: Patent without evidence of aneurysm, dissection, vasculitis or significant stenosis. Renals: Both renal arteries are patent without evidence of aneurysm, dissection, vasculitis, fibromuscular dysplasia or significant stenosis. IMA: Patent. Inflow: Patent without evidence of aneurysm, dissection, vasculitis or significant stenosis. Moderate calcified atherosclerotic plaque. Veins: No obvious venous abnormality within the limitations of this arterial phase study. Review of the MIP images confirms the above findings. NON-VASCULAR Hepatobiliary: No focal liver abnormality is seen. Status post cholecystectomy. No biliary dilatation. Pancreas: Unremarkable. No pancreatic ductal dilatation or surrounding inflammatory changes. Spleen: Normal in size without focal abnormality. Adrenals/Urinary Tract: Unremarkable adrenal glands. Left upper pole renal cyst with layering high attenuation material has slightly increased in size from 2016, now measuring 3.9 cm and previously measuring 3.4 cm. Additional mid pole 2.9 cm left simple renal cyst has also slightly increased in size (previously measured 2.2 cm. The right kidney is unremarkable. No suspicious renal mass, stone, or  hydronephrosis. Ureters and urinary bladder within normal limits. Stomach/Bowel: Stomach is within normal limits. Appendix appears normal (series 7, image 257). Scattered colonic diverticulosis. No evidence of bowel wall thickening, distention, or inflammatory changes. Lymphatic: No abdominopelvic lymphadenopathy. Reproductive: Prostate gland within normal limits. Other: No abdominal wall hernia or abnormality. No abdominopelvic ascites. Soft tissue density within the lower anterior abdominal wall, slightly increased from prior, and may reflect injection related changes. Musculoskeletal: No acute or significant osseous findings. Review of the MIP images confirms the above findings.  IMPRESSION: 1. Negative for aortic aneurysm or dissection. 2. Mosaic attenuation within the mid to lower lung fields bilaterally, which can be seen in the setting of small airways disease. 3. No acute findings within the chest, abdomen, or pelvis. 4. Colonic diverticulosis without evidence of acute diverticulitis. 5. Aortic and coronary artery atherosclerosis. Electronically Signed   By: Davina Poke D.O.   On: 03/08/2020 13:56    Scheduled Meds: . aspirin EC  81 mg Oral QHS  . enoxaparin (LOVENOX) injection  40 mg Subcutaneous Q24H  . fenofibrate  160 mg Oral QHS  . fluticasone  2 spray Each Nare QHS  . icosapent Ethyl  2 g Oral BID  . insulin aspart  0-5 Units Subcutaneous QHS  . insulin aspart  0-9 Units Subcutaneous TID WC  . insulin aspart  25 Units Subcutaneous TID AC  . insulin glargine  60 Units Subcutaneous QHS  . lidocaine  1 patch Transdermal Q24H  . lisinopril  10 mg Oral Daily  . multivitamin with minerals  1 tablet Oral Daily  . nitroGLYCERIN  1 inch Topical Q6H  . oxybutynin  5 mg Oral BID  . rosuvastatin  40 mg Oral QHS  . sertraline  150 mg Oral QHS  . vitamin B-12  500 mcg Oral Daily  . zolpidem  10 mg Oral QHS   Continuous Infusions:   LOS: 0 days   Marylu Lund, MD [facility] Pager On Amion  If 7PM-7AM, please contact night-coverage 03/09/2020, 5:42 PM

## 2020-03-09 NOTE — Progress Notes (Signed)
Progress Note  Patient Name: Brent [name]. Date of Encounter: 03/09/2020  [facility] Cardiologist: Lauree Chandler, MD   Subjective   Saw patient down in nuclear medicine. Patient denies any chest pain. He has headache with the Nitro patch and would like this removed. He notes some shortness of breath at rest. However, his main complaint is profound weakness that has progressed since admission. He presenting with right leg weakness but he now how bilateral lower and upper extremity weakness. He cannot sit up in bed or hold a pen to sign consent form. Called and spoke with Dr. Debara Pickett who recommended proceeding with Lexiscan and then consulting Neurology.  Inpatient Medications    Scheduled Meds: . aspirin EC  81 mg Oral QHS  . enoxaparin (LOVENOX) injection  40 mg Subcutaneous Q24H  . fenofibrate  160 mg Oral QHS  . fluticasone  2 spray Each Nare QHS  . icosapent Ethyl  2 g Oral BID  . insulin aspart  0-5 Units Subcutaneous QHS  . insulin aspart  0-9 Units Subcutaneous TID WC  . insulin aspart  25 Units Subcutaneous TID AC  . insulin glargine  60 Units Subcutaneous QHS  . lidocaine  1 patch Transdermal Q24H  . lisinopril  10 mg Oral Daily  . multivitamin with minerals  1 tablet Oral Daily  . nitroGLYCERIN  1 inch Topical Q6H  . oxybutynin  5 mg Oral BID  . rosuvastatin  40 mg Oral QHS  . sertraline  150 mg Oral QHS  . vitamin B-12  500 mcg Oral Daily  . zolpidem  10 mg Oral QHS   Continuous Infusions:  PRN Meds: acetaminophen **OR** acetaminophen, LORazepam   Vital Signs    Vitals:   03/08/20 2044 03/08/20 2347 03/09/20 0416 03/09/20 0740  BP: (!) 162/71 (!) 142/68 119/68 124/67  Pulse: 85 72 71 78  Resp: 16 13 19 18   Temp: 98.5 F (36.9 C) 98.6 F (37 C) 98.4 F (36.9 C) 98.9 F (37.2 C)  TempSrc: Oral Oral Oral Oral  SpO2: 94% 96% 98% 97%  Weight: 98.4 kg     Height:        Intake/Output Summary (Last 24 hours) at 03/09/2020 1243 Last data  filed at 03/09/2020 0418 Gross per 24 hour  Intake --  Output 925 ml  Net -925 ml   Last 3 Weights 03/08/2020 03/08/2020 08/23/2019  Weight (lbs) 216 lb 14.9 oz 223 lb 221 lb 8 oz  Weight (kg) 98.4 kg 101.152 kg 100.472 kg      Telemetry    Unable to review telemetry down in nuclear medicine but in normal sinus rhythm throughout test. - Personally Reviewed  ECG    No new EKG tracing. - Personally Reviewed  Physical Exam   GEN: No acute distress.   Neck: Supple. Cardiac: RRR. No murmurs, rubs, or gallops.  Respiratory: Clear to auscultation bilaterally. GI: Soft, non-distended, and non-tender. MS: No lower extremity edema. No deformity. Neuro:  Profound lower and upper extremity weakness. Could not move lower extremities against resistance. Could not grip pen to sign consent form. No facial asymmetry. Psych: Normal affect   Labs    High Sensitivity Troponin:   Recent Labs  Lab 03/08/20 1142 03/08/20 1359  TROPONINIHS 4 7      Chemistry Recent Labs  Lab 03/08/20 1142  NA 139  K 4.0  CL 103  CO2 24  GLUCOSE 229*  BUN 18  CREATININE 0.96  CALCIUM 9.5  PROT 6.5  ALBUMIN 4.0  AST 40  ALT 30  ALKPHOS 71  BILITOT 0.8  GFRNONAA >60  GFRAA >60  ANIONGAP 12     Hematology Recent Labs  Lab 03/08/20 1142  WBC 7.2  RBC 4.74  HGB 13.8  HCT 42.2  MCV 89.0  MCH 29.1  MCHC 32.7  RDW 13.3  PLT 173    BNPNo results for input(s): BNP, PROBNP in the last 168 hours.   DDimer No results for input(s): DDIMER in the last 168 hours.   Radiology    DG Lumbar Spine 2-3 Views  Result Date: 03/08/2020 CLINICAL DATA:  74 year old male with right lower extremity numbness. EXAM: LUMBAR SPINE - 2-3 VIEW COMPARISON:  Abdominal radiograph dated 11/30/2016 and CT dated 03/08/2020. FINDINGS: Five lumbar type vertebra. There is no acute fracture or subluxation of the lumbar spine. The bones are osteopenic. Multilevel degenerative changes with spurring. There is disc space  narrowing at L4-L5. The visualized posterior elements are intact. There is atherosclerotic calcification of the abdominal aorta. Partially visualized high attenuating structure in the left upper abdomen corresponds to the cyst seen on the earlier CT. Excreted contrast noted within the urinary bladder. Linear density over the left lower abdomen may be artifactual and superimposed on the patient. IMPRESSION: 1. No acute fracture or subluxation of the lumbar spine. 2. Osteopenia with multilevel degenerative changes. Electronically Signed   By: Anner Crete M.D.   On: 03/08/2020 19:03   DG Chest Portable 1 View  Result Date: 03/08/2020 CLINICAL DATA:  Central chest pain radiating to back which shortness-of-breath and diaphoresis. EXAM: PORTABLE CHEST 1 VIEW COMPARISON:  11/11/2018 FINDINGS: Lungs are adequately inflated without focal airspace consolidation or effusion. Borderline stable cardiomegaly. Degenerative change of the spine. IMPRESSION: No acute cardiopulmonary disease. Electronically Signed   By: Marin Olp M.D.   On: 03/08/2020 12:31   VAS [place] ABI WITH/WO TBI  Result Date: 03/08/2020 LOWER EXTREMITY DOPPLER STUDY Indications: Cold right foot, unable to move right leg, hip pain. High Risk Factors: Hypertension, hyperlipidemia, Diabetes, past history of                    smoking, coronary artery disease. Other Factors: History of lumbar issues.  Comparison Study: No prior study on file Performing Technologist: Sharion Dove RVS  Examination Guidelines: A complete evaluation includes at minimum, Doppler waveform signals and systolic blood pressure reading at the level of bilateral brachial, anterior tibial, and posterior tibial arteries, when vessel segments are accessible. Bilateral testing is considered an integral part of a complete examination. Photoelectric Plethysmograph (PPG) waveforms and toe systolic pressure readings are included as required and additional duplex testing as needed.  Limited examinations for reoccurring indications may be performed as noted.  ABI Findings: +---------+------------------+-----+-----------+--------+ Right    Rt Pressure (mmHg)IndexWaveform   Comment  +---------+------------------+-----+-----------+--------+ Brachial 161                    triphasic           +---------+------------------+-----+-----------+--------+ Popliteal                       multiphasic         +---------+------------------+-----+-----------+--------+ PTA      197               1.22 multiphasic         +---------+------------------+-----+-----------+--------+ DP       201  1.25 multiphasic         +---------+------------------+-----+-----------+--------+ Great Toe122               0.76                     +---------+------------------+-----+-----------+--------+ +---------+------------------+-----+-----------+-------+ Left     Lt Pressure (mmHg)IndexWaveform   Comment +---------+------------------+-----+-----------+-------+ Brachial 144                    triphasic          +---------+------------------+-----+-----------+-------+ PTA      196               1.22 multiphasic        +---------+------------------+-----+-----------+-------+ DP       191               1.19 multiphasic        +---------+------------------+-----+-----------+-------+ Great Toe115               0.71                    +---------+------------------+-----+-----------+-------+ +-------+-----------+-----------+------------+------------+ ABI/TBIToday's ABIToday's TBIPrevious ABIPrevious TBI +-------+-----------+-----------+------------+------------+ Right  1.2        0.76                                +-------+-----------+-----------+------------+------------+ Left   1.2        0.71                                +-------+-----------+-----------+------------+------------+  Summary: Right: Resting right ankle-brachial index is  within normal range. No evidence of significant right lower extremity arterial disease. The right toe-brachial index is normal. Left: Resting left ankle-brachial index is within normal range. No evidence of significant left lower extremity arterial disease. The left toe-brachial index is normal.  *See table(s) above for measurements and observations.     Preliminary    CT Angio Chest/Abd/Pel for Dissection W and/or W/WO  Result Date: 03/08/2020 CLINICAL DATA:  Chest pain radiating to the back. Shortness of breath EXAM: CT ANGIOGRAPHY CHEST, ABDOMEN AND PELVIS TECHNIQUE: Non-contrast CT of the chest was initially obtained. Multidetector CT imaging through the chest, abdomen and pelvis was performed using the standard protocol during bolus administration of intravenous contrast. Multiplanar reconstructed images and MIPs were obtained and reviewed to evaluate the vascular anatomy. CONTRAST:  138mL OMNIPAQUE IOHEXOL 350 MG/ML SOLN COMPARISON:  CT 07/03/2015, 07/16/2012. FINDINGS: CTA CHEST FINDINGS Cardiovascular: Preferential opacification of the thoracic aorta. No evidence of thoracic aortic aneurysm or dissection. Precontrast images of the chest reveal no evidence of intramural hematoma. Three vessel arch. Atherosclerotic calcifications of the aorta and coronary arteries. Normal heart size. No pericardial effusion. Mediastinum/Nodes: No enlarged mediastinal, hilar, or axillary lymph nodes. Thyroid gland, trachea, and esophagus demonstrate no significant findings. Lungs/Pleura: Mild bibasilar subsegmental atelectasis. Mosaic attenuation within the mid to lower lung fields bilaterally. No focal airspace consolidation. No pleural effusion or pneumothorax. Musculoskeletal: No chest wall abnormality. No acute or significant osseous findings. Review of the MIP images confirms the above findings. CTA ABDOMEN AND PELVIS FINDINGS VASCULAR Aorta: Normal caliber aorta without aneurysm, dissection, vasculitis or significant  stenosis. Moderate calcified atherosclerotic plaque. Celiac: Patent without evidence of aneurysm, dissection, vasculitis or significant stenosis. SMA: Patent without evidence of aneurysm, dissection, vasculitis or significant stenosis. Renals: Both renal arteries  are patent without evidence of aneurysm, dissection, vasculitis, fibromuscular dysplasia or significant stenosis. IMA: Patent. Inflow: Patent without evidence of aneurysm, dissection, vasculitis or significant stenosis. Moderate calcified atherosclerotic plaque. Veins: No obvious venous abnormality within the limitations of this arterial phase study. Review of the MIP images confirms the above findings. NON-VASCULAR Hepatobiliary: No focal liver abnormality is seen. Status post cholecystectomy. No biliary dilatation. Pancreas: Unremarkable. No pancreatic ductal dilatation or surrounding inflammatory changes. Spleen: Normal in size without focal abnormality. Adrenals/Urinary Tract: Unremarkable adrenal glands. Left upper pole renal cyst with layering high attenuation material has slightly increased in size from 2016, now measuring 3.9 cm and previously measuring 3.4 cm. Additional mid pole 2.9 cm left simple renal cyst has also slightly increased in size (previously measured 2.2 cm. The right kidney is unremarkable. No suspicious renal mass, stone, or hydronephrosis. Ureters and urinary bladder within normal limits. Stomach/Bowel: Stomach is within normal limits. Appendix appears normal (series 7, image 257). Scattered colonic diverticulosis. No evidence of bowel wall thickening, distention, or inflammatory changes. Lymphatic: No abdominopelvic lymphadenopathy. Reproductive: Prostate gland within normal limits. Other: No abdominal wall hernia or abnormality. No abdominopelvic ascites. Soft tissue density within the lower anterior abdominal wall, slightly increased from prior, and may reflect injection related changes. Musculoskeletal: No acute or significant  osseous findings. Review of the MIP images confirms the above findings. IMPRESSION: 1. Negative for aortic aneurysm or dissection. 2. Mosaic attenuation within the mid to lower lung fields bilaterally, which can be seen in the setting of small airways disease. 3. No acute findings within the chest, abdomen, or pelvis. 4. Colonic diverticulosis without evidence of acute diverticulitis. 5. Aortic and coronary artery atherosclerosis. Electronically Signed   By: Davina Poke D.O.   On: 03/08/2020 13:56    Cardiac Studies   Echocardiogram 08/18/2017: Study Conclusions: - Left ventricle: The cavity size was normal. Wall thickness was  normal. Systolic function was normal. The estimated ejection  fraction was in the range of 55% to 60%. Wall motion was normal;  there were no regional wall motion abnormalities. Doppler  parameters are consistent with abnormal left ventricular  relaxation (grade 1 diastolic dysfunction).   Impressions: - Normal LV systolic function; mild diastolic dysfunction; trace MR  and TR.  Patient Profile     74 y.o. male with a history of CAD s/p PCI in 1990 and 2000, obstructive sleep apnea, hypertension, hyperlipidemia, diabetes, and obesity who is being seen for evaluation of chest pain at the request of Dr. Rex Kras.  Assessment & Plan    Chest Pain with known CAD - History of CAD with angioplasty of RCA in 1990 and BMS to RCA in 2000. Last cath in 2012 showed stable moderate diffuse CAD.  - High-sensitivity troponin negative x2. - EKG shows T wave inversion in lead aVL but this is not new and seen on prior tracings.  - Currently chest pain free. - Removed Nitro patch given headache. - Continue aspirin and statin. - Results of nuclear stress test pending.  Profound Progressive Weakness - Patient has profound weakness that has progressed since being seen in the ED.  - Called and spoke with Dr. Lorraine Lax (Neurology) who suggested initial work-up for  generalized weakness with [facility]. Called and spoke with Dr. Wyline Copas who is going to exam patient and likely order head, cervical, thoracic, and lumbar MRI.  Hypertension - BP elevated at time. - Continue Lisinopril 10mg  daily. Can increase if needed.  Hyperlipidemia  - LDL 65 in 01/2020.  -  Continue home Crestor 40mg  daily, Vascepa, and fenofibrate.  Type 2 Diabetes Mellitus - Management per primary team.    For questions or updates, please contact [place] Please consult [email or web address] for contact info under        Signed, Darreld Mclean, PA-C  03/09/2020, 12:43 PM

## 2020-03-09 NOTE — Consult Note (Addendum)
NEUROLOGY CONSULT  Reason for Consult: weakness Referring Physician: Sarajane [name], [place]  CC: weakness in both hands and bilat legs  HPI: Brent [name]. is an 74 y.o. male who was admitted on 6/20 for severe CP that radiated to his back with associated sob and diaphoresis. He reports that he walked to the EMS stretcher without weakness, but did not try to stand again till late last night, when he did try to stand he crumpled to the ground. He noted some right leg weakness first, then quickly had bilateral leg weakness. He says the sensation is altered and describes a heavy feeling, but can feel light touch and proprioception on exam. This afternoon he noted his fingers and hands becoming tingly and having the same sensation and now his grip strength is limited. He does report a GI illness ~3 weeks ago.   Past Medical History Past Medical History:  Diagnosis Date  . Allergy   . CAD (coronary artery disease)    a. angioplasty of his RCA in 1990. b. bare metal stent placed in the RCA in 2000 followed by rotational atherectomy shortly after for stent restenosis. c. last cath was in 2012 showing stable moderate diffuse CAD. (70% mid LAD, 80% diagonal, 70% Ramus, 40% mid to distal RCA stent restenosis). d. Low risk nuc in 2015.  . Diabetes mellitus   . Diverticulosis   . Elevated CK   . Erectile dysfunction   . Hemorrhoids   . HTN (hypertension)   . Hyperlipidemia   . Hypertriglyceridemia   . Malignant melanoma of left side of neck ([place]) 10/25/2018  . Myocardial infarction ([place])   . Obesity   . OSA (obstructive sleep apnea)   . Persistent disorder of initiating or maintaining sleep   . Personal history of colonic polyps 02/05/2003    Past Surgical History Past Surgical History:  Procedure Laterality Date  . CHOLECYSTECTOMY    . CORONARY ANGIOPLASTY    . CORONARY STENT PLACEMENT     stenting of the right coronary artery with followup rotational  atherectomy. (3 stents placed)  . FINGER  SURGERY     right  . FOOT SURGERY     right  . INGUINAL HERNIA REPAIR     right  . melanoma removal     neck  . ORTHOPEDIC SURGERY     foot right  . rotator cuff surg     Bil    Family History Family History  Problem Relation Age of Onset  . Liver cancer Father   . Lung cancer Father   . Heart disease Father   . Heart disease Sister   . Lung cancer Mother   . COPD Mother   . Obesity Mother   . Colon cancer Neg Hx   . Stomach cancer Neg Hx   . Pancreatic cancer Neg Hx     Social History    reports that he quit smoking about 39 years ago. His smoking use included cigarettes. He has a 45.00 pack-year smoking history. He has never used smokeless tobacco. He reports current alcohol use of about 7.0 - 14.0 standard drinks of alcohol per week. He reports that he does not use drugs.  Allergies Allergies  Allergen Reactions  . Penicillins Hives and Rash    Home Medications Medications Prior to Admission  Medication Sig Dispense Refill  . aspirin EC 81 MG tablet Take 81 mg by mouth at bedtime. Swallow whole.    . empagliflozin (JARDIANCE) 25 MG TABS tablet Take  12.5 mg by mouth daily.    . fenofibrate 160 MG tablet Take 160 mg by mouth at bedtime.     . fluticasone (FLONASE) 50 MCG/ACT nasal spray SPRAY 2 SPRAYS INTO EACH NOSTRIL EVERY DAY (Patient taking differently: Place 2 sprays into both nostrils at bedtime. ) 48 mL 3  . ibuprofen (ADVIL) 200 MG tablet Take 600 mg by mouth daily as needed for headache (pain).    Brent [name] (VASCEPA) 1 g CAPS Take 2 capsules (2 g total) by mouth 2 (two) times daily. 120 capsule 3  . insulin aspart (NOVOLOG FLEXPEN) 100 UNIT/ML FlexPen Inject 25-30 Units into the skin 3 (three) times daily before meals.     . insulin glargine (LANTUS) 100 unit/mL SOPN Inject 60 Units into the skin at bedtime.     Marland Kitchen lisinopril (ZESTRIL) 10 MG tablet Take 10 mg by mouth daily. For kidney protection    . LORazepam (ATIVAN) 1 MG tablet TAKE 1 TABLET BY  MOUTH TWICE A DAY AS NEEDED FOR ANXIETY (Patient taking differently: Take 1 mg by mouth See admin instructions. Take one tablet (1 mg) by mouth daily at bedtime, may also take one tablet (1 mg) during the day as needed for anxiety) 180 tablet 0  . metFORMIN (GLUCOPHAGE) 1000 MG tablet Take 1,000 mg by mouth 2 (two) times daily with a meal.    . Multiple Vitamin (MULTIVITAMIN WITH MINERALS) TABS tablet Take 1 tablet by mouth daily. Centrum Silver Men's    . oxybutynin (DITROPAN) 5 MG tablet Take 5 mg by mouth 2 (two) times daily.     . rosuvastatin (CRESTOR) 40 MG tablet Take 40 mg by mouth at bedtime.     . sertraline (ZOLOFT) 100 MG tablet Take 150 mg by mouth at bedtime.     . vitamin B-12 (CYANOCOBALAMIN) 500 MCG tablet Take 500 mcg by mouth daily.    Marland Kitchen zolpidem (AMBIEN) 10 MG tablet Take 10 mg by mouth at bedtime. For insomnia     . benzonatate (TESSALON) 100 MG capsule Take 1 capsule (100 mg total) by mouth 2 (two) times daily as needed for cough. (Patient not taking: Reported on 03/06/2020) 20 capsule 0  . lisinopril (ZESTRIL) 20 MG tablet Take 1 tablet (20 mg total) by mouth daily. 90 tablet 1  . triamcinolone ointment (KENALOG) 0.1 % Apply 1 application topically 2 (two) times daily. (Patient not taking: Reported on 03/08/2020) 80 g 1    Hospital Medications . aspirin EC  81 mg Oral QHS  . enoxaparin (LOVENOX) injection  40 mg Subcutaneous Q24H  . fenofibrate  160 mg Oral QHS  . fluticasone  2 spray Each Nare QHS  . icosapent [name]  2 g Oral BID  . insulin aspart  0-5 Units Subcutaneous QHS  . insulin aspart  0-9 Units Subcutaneous TID WC  . insulin aspart  25 Units Subcutaneous TID AC  . insulin glargine  60 Units Subcutaneous QHS  . lidocaine  1 patch Transdermal Q24H  . lisinopril  10 mg Oral Daily  . multivitamin with minerals  1 tablet Oral Daily  . nitroGLYCERIN  1 inch Topical Q6H  . oxybutynin  5 mg Oral BID  . rosuvastatin  40 mg Oral QHS  . sertraline  150 mg Oral QHS  .  vitamin B-12  500 mcg Oral Daily  . zolpidem  10 mg Oral QHS     ROS: History obtained from pt and wife  General ROS: negative for -  chills, fatigue, fever, night sweats, weight gain or weight loss Psychological ROS: negative for - behavioral disorder, hallucinations, memory difficulties, mood swings or suicidal ideation Ophthalmic ROS: negative for - blurry vision, double vision, eye pain or loss of vision ENT ROS: negative for - epistaxis, nasal discharge, oral lesions, sore throat, tinnitus or vertigo Allergy and Immunology ROS: negative for - hives or itchy/watery eyes Hematological and Lymphatic ROS: negative for - bleeding problems, bruising or swollen lymph nodes Endocrine ROS: negative for - galactorrhea, hair pattern changes, polydipsia/polyuria or temperature intolerance Respiratory ROS: negative for - cough, hemoptysis, shortness of breath or wheezing Cardiovascular ROS: positive for - chest pain, dyspnea on exertion. No edema or irregular heartbeat Gastrointestinal ROS: negative for - abdominal pain, diarrhea, hematemesis, nausea/vomiting or stool incontinence Genito-Urinary ROS: negative for - dysuria, hematuria, incontinence or urinary frequency/urgency Musculoskeletal ROS: negative for - joint swelling or muscular weakness Neurological ROS: as noted in HPI Dermatological ROS: negative for rash and skin lesion changes   Physical Examination:  Vitals:   03/09/20 1344 03/09/20 1346 03/09/20 1347 03/09/20 1534  BP: (!) 120/55 (!) 104/59 140/65 (!) 111/58  Pulse: 89 100 97 72  Resp:    14  Temp:    98.2 F (36.8 C)  TempSrc:    Oral  SpO2:    98%  Weight:      Height:        General - obese elderly male in mild distress Heart - Regular rate and rhythm - no murmer Lungs - Clear to auscultation Abdomen - Soft - non tender Extremities - Distal pulses intact - no edema Skin - Warm and dry  Neurologic Examination:   Mental Status:  Alert, oriented, thought content  appropriate. Speech without evidence of dysarthria or aphasia. Able to follow 3 step commands without difficulty.  Cranial Nerves:  II-bilateral visual fields intact III/IV/VI-Pupils were equal and reacted. Extraocular movements were full.  V/VII-no facial numbness and no facial weakness.  VIII-hearing normal.  X-normal speech and symmetrical palatal movement.  XII-midline tongue extension  Motor: Upper extremities both have good prox strength is 5/5, distal grip strength is 1/5. Both lower extremities are 0/5     Tone and bulk:normal tone throughout; no atrophy noted Sensory: Intact to light touch in all extremities and able to determine proprioception in toes on both sides, yet reports a "heavy" abnormal sensation in both legs, both hands to wrists. He also notes this same abnormal feeling in a band across abdomen right at umbilical line Deep Tendon Reflexes: 3/4 throughout Plantars: Downgoing bilaterally  Cerebellar: Normal finger to nose and heel to shin bilaterally. Gait: not tested   LABORATORY STUDIES:  Basic Metabolic Panel: Recent Labs  Lab 03/08/20 1142  NA 139  K 4.0  CL 103  CO2 24  GLUCOSE 229*  BUN 18  CREATININE 0.96  CALCIUM 9.5    Liver Function Tests: Recent Labs  Lab 03/08/20 1142  AST 40  ALT 30  ALKPHOS 71  BILITOT 0.8  PROT 6.5  ALBUMIN 4.0   Recent Labs  Lab 03/08/20 1142  LIPASE 29   No results for input(s): AMMONIA in the last 168 hours.  CBC: Recent Labs  Lab 03/08/20 1142  WBC 7.2  NEUTROABS 3.8  HGB 13.8  HCT 42.2  MCV 89.0  PLT 173    Cardiac Enzymes: No results for input(s): CKTOTAL, CKMB, CKMBINDEX, TROPONINI in the last 168 hours.  BNP: Invalid input(s): POCBNP  CBG: Recent Labs  Lab 03/08/20  1917 03/08/20 2112 03/09/20 0615 03/09/20 1054 03/09/20 1636  GLUCAP 111* 210* 182* 182* 224*    Microbiology:   Coagulation Studies: No results for input(s): LABPROT, INR in the last 72 hours.  Urinalysis: No  results for input(s): COLORURINE, LABSPEC, PHURINE, GLUCOSEU, HGBUR, BILIRUBINUR, KETONESUR, PROTEINUR, UROBILINOGEN, NITRITE, LEUKOCYTESUR in the last 168 hours.  Invalid input(s): APPERANCEUR  Lipid Panel:     Component Value Date/Time   CHOL 135 01/20/2020 0754   TRIG 295 (H) 01/20/2020 0754   HDL 23 (L) 01/20/2020 0754   CHOLHDL 5.9 (H) 01/20/2020 0754   CHOLHDL 6 04/15/2016 1133   VLDL 54.8 (H) 03/18/2015 0833   LDLCALC 65 01/20/2020 0754    HgbA1C:  Lab Results  Component Value Date   HGBA1C 6.4 (A) 08/23/2019    Urine Drug Screen:  No results found for: LABOPIA, COCAINSCRNUR, LABBENZ, AMPHETMU, THCU, LABBARB   Alcohol Level:  No results for input(s): ETH in the last 168 hours.  Miscellaneous labs:  EKG  EKG  IMAGING: DG Lumbar Spine 2-3 Views  Result Date: 03/08/2020 CLINICAL DATA:  74 year old male with right lower extremity numbness. EXAM: LUMBAR SPINE - 2-3 VIEW COMPARISON:  Abdominal radiograph dated 11/30/2016 and CT dated 03/08/2020. FINDINGS: Five lumbar type vertebra. There is no acute fracture or subluxation of the lumbar spine. The bones are osteopenic. Multilevel degenerative changes with spurring. There is disc space narrowing at L4-L5. The visualized posterior elements are intact. There is atherosclerotic calcification of the abdominal aorta. Partially visualized high attenuating structure in the left upper abdomen corresponds to the cyst seen on the earlier CT. Excreted contrast noted within the urinary bladder. Linear density over the left lower abdomen may be artifactual and superimposed on the patient. IMPRESSION: 1. No acute fracture or subluxation of the lumbar spine. 2. Osteopenia with multilevel degenerative changes. Electronically Signed   By: Anner Crete M.D.   On: 03/08/2020 19:03   NM Myocar Multi W/Spect W/Wall Motion / EF  Result Date: 03/09/2020 CLINICAL DATA:  Recent onset of chest pain. History diabetes, remote myocardial infarction  and coronary stenting. EXAM: MYOCARDIAL IMAGING WITH SPECT (REST AND PHARMACOLOGIC-STRESS) GATED LEFT VENTRICULAR WALL MOTION STUDY LEFT VENTRICULAR EJECTION FRACTION TECHNIQUE: Standard myocardial SPECT imaging was performed after resting intravenous injection of 11 mCi Tc-49m tetrofosmin. Subsequently, intravenous infusion of Lexiscan was performed under the supervision of the Cardiology staff. At peak effect of the drug, 29 mCi Tc-36m tetrofosmin was injected intravenously and standard myocardial SPECT imaging was performed. Quantitative gated imaging was also performed to evaluate left ventricular wall motion, and estimate left ventricular ejection fraction. COMPARISON:  Images from nuclear medicine myocardial perfusion study 09/25/2013 without report FINDINGS: Perfusion: No decreased activity in the left ventricle on stress imaging to suggest reversible ischemia or infarction. Mildly prominent subdiaphragmatic activity on the rest images. Wall Motion: Normal left ventricular wall motion. No left ventricular dilation. Left Ventricular Ejection Fraction: 53 % End diastolic volume 91 ml End systolic volume 43 ml IMPRESSION: 1. No reversible ischemia or infarction. 2. Normal left ventricular wall motion. 3. Left ventricular ejection fraction 53% 4. Non invasive risk stratification*: Low *2012 Appropriate Use Criteria for Coronary Revascularization Focused Update: J Am Coll Cardiol. 1610;96(0):454-098. http://content.[email or web address].aspx?[email or web address] Electronically Signed   By: Richardean Sale M.D.   On: 03/09/2020 16:43   DG Chest Portable 1 View  Result Date: 03/08/2020 CLINICAL DATA:  Central chest pain radiating to back which shortness-of-breath and diaphoresis. EXAM: PORTABLE CHEST 1 VIEW COMPARISON:  11/11/2018 FINDINGS: Lungs are  adequately inflated without focal airspace consolidation or effusion. Borderline stable cardiomegaly. Degenerative change of the spine. IMPRESSION: No acute  cardiopulmonary disease. Electronically Signed   By: Marin Olp M.D.   On: 03/08/2020 12:31   VAS [place] ABI WITH/WO TBI  Result Date: 03/09/2020 LOWER EXTREMITY DOPPLER STUDY Indications: Cold right foot, unable to move right leg, hip pain. High Risk Factors: Hypertension, hyperlipidemia, Diabetes, past history of                    smoking, coronary artery disease. Other Factors: History of lumbar issues.  Comparison Study: No prior study on file Performing Technologist: Sharion Dove RVS  Examination Guidelines: A complete evaluation includes at minimum, Doppler waveform signals and systolic blood pressure reading at the level of bilateral brachial, anterior tibial, and posterior tibial arteries, when vessel segments are accessible. Bilateral testing is considered an integral part of a complete examination. Photoelectric Plethysmograph (PPG) waveforms and toe systolic pressure readings are included as required and additional duplex testing as needed. Limited examinations for reoccurring indications may be performed as noted.  ABI Findings: +---------+------------------+-----+-----------+--------+ Right    Rt Pressure (mmHg)IndexWaveform   Comment  +---------+------------------+-----+-----------+--------+ Brachial 161                    triphasic           +---------+------------------+-----+-----------+--------+ Popliteal                       multiphasic         +---------+------------------+-----+-----------+--------+ PTA      197               1.22 multiphasic         +---------+------------------+-----+-----------+--------+ DP       201               1.25 multiphasic         +---------+------------------+-----+-----------+--------+ Great Toe122               0.76                     +---------+------------------+-----+-----------+--------+ +---------+------------------+-----+-----------+-------+ Left     Lt Pressure (mmHg)IndexWaveform   Comment  +---------+------------------+-----+-----------+-------+ Brachial 144                    triphasic          +---------+------------------+-----+-----------+-------+ PTA      196               1.22 multiphasic        +---------+------------------+-----+-----------+-------+ DP       191               1.19 multiphasic        +---------+------------------+-----+-----------+-------+ Great Toe115               0.71                    +---------+------------------+-----+-----------+-------+ +-------+-----------+-----------+------------+------------+ ABI/TBIToday's ABIToday's TBIPrevious ABIPrevious TBI +-------+-----------+-----------+------------+------------+ Right  1.2        0.76                                +-------+-----------+-----------+------------+------------+ Left   1.2        0.71                                +-------+-----------+-----------+------------+------------+  Summary: Right: Resting right ankle-brachial index is within normal range. No evidence of significant right lower extremity arterial disease. The right toe-brachial index is normal. Left: Resting left ankle-brachial index is within normal range. No evidence of significant left lower extremity arterial disease. The left toe-brachial index is normal.  *See table(s) above for measurements and observations.  Electronically signed by Ruta Hinds MD on 03/09/2020 at 3:38:09 PM.    Final    CT Angio Chest/Abd/Pel for Dissection W and/or W/WO  Result Date: 03/08/2020 CLINICAL DATA:  Chest pain radiating to the back. Shortness of breath EXAM: CT ANGIOGRAPHY CHEST, ABDOMEN AND PELVIS TECHNIQUE: Non-contrast CT of the chest was initially obtained. Multidetector CT imaging through the chest, abdomen and pelvis was performed using the standard protocol during bolus administration of intravenous contrast. Multiplanar reconstructed images and MIPs were obtained and reviewed to evaluate the vascular anatomy.  CONTRAST:  163mL OMNIPAQUE IOHEXOL 350 MG/ML SOLN COMPARISON:  CT 07/03/2015, 07/16/2012. FINDINGS: CTA CHEST FINDINGS Cardiovascular: Preferential opacification of the thoracic aorta. No evidence of thoracic aortic aneurysm or dissection. Precontrast images of the chest reveal no evidence of intramural hematoma. Three vessel arch. Atherosclerotic calcifications of the aorta and coronary arteries. Normal heart size. No pericardial effusion. Mediastinum/Nodes: No enlarged mediastinal, hilar, or axillary lymph nodes. Thyroid gland, trachea, and esophagus demonstrate no significant findings. Lungs/Pleura: Mild bibasilar subsegmental atelectasis. Mosaic attenuation within the mid to lower lung fields bilaterally. No focal airspace consolidation. No pleural effusion or pneumothorax. Musculoskeletal: No chest wall abnormality. No acute or significant osseous findings. Review of the MIP images confirms the above findings. CTA ABDOMEN AND PELVIS FINDINGS VASCULAR Aorta: Normal caliber aorta without aneurysm, dissection, vasculitis or significant stenosis. Moderate calcified atherosclerotic plaque. Celiac: Patent without evidence of aneurysm, dissection, vasculitis or significant stenosis. SMA: Patent without evidence of aneurysm, dissection, vasculitis or significant stenosis. Renals: Both renal arteries are patent without evidence of aneurysm, dissection, vasculitis, fibromuscular dysplasia or significant stenosis. IMA: Patent. Inflow: Patent without evidence of aneurysm, dissection, vasculitis or significant stenosis. Moderate calcified atherosclerotic plaque. Veins: No obvious venous abnormality within the limitations of this arterial phase study. Review of the MIP images confirms the above findings. NON-VASCULAR Hepatobiliary: No focal liver abnormality is seen. Status post cholecystectomy. No biliary dilatation. Pancreas: Unremarkable. No pancreatic ductal dilatation or surrounding inflammatory changes. Spleen: Normal  in size without focal abnormality. Adrenals/Urinary Tract: Unremarkable adrenal glands. Left upper pole renal cyst with layering high attenuation material has slightly increased in size from 2016, now measuring 3.9 cm and previously measuring 3.4 cm. Additional mid pole 2.9 cm left simple renal cyst has also slightly increased in size (previously measured 2.2 cm. The right kidney is unremarkable. No suspicious renal mass, stone, or hydronephrosis. Ureters and urinary bladder within normal limits. Stomach/Bowel: Stomach is within normal limits. Appendix appears normal (series 7, image 257). Scattered colonic diverticulosis. No evidence of bowel wall thickening, distention, or inflammatory changes. Lymphatic: No abdominopelvic lymphadenopathy. Reproductive: Prostate gland within normal limits. Other: No abdominal wall hernia or abnormality. No abdominopelvic ascites. Soft tissue density within the lower anterior abdominal wall, slightly increased from prior, and may reflect injection related changes. Musculoskeletal: No acute or significant osseous findings. Review of the MIP images confirms the above findings. IMPRESSION: 1. Negative for aortic aneurysm or dissection. 2. Mosaic attenuation within the mid to lower lung fields bilaterally, which can be seen in the setting of small airways disease. 3. No acute findings within the chest, abdomen, or pelvis. 4. Colonic diverticulosis without evidence  of acute diverticulitis. 5. Aortic and coronary artery atherosclerosis. Electronically Signed   By: Davina Poke D.O.   On: 03/08/2020 13:56     Assessment/Plan: 74yr old admitted for CP and cardiology wk up. Today staff noted diffuse weakness in bilat legs and bilat hands. Neurology was consulted. Pt reports leg weakness started yesterday, the fingers and hands started today.   # Weakness- odd presentation of first being right leg, then both and now both hands. Ddx: Spinal pathology such as cord compression,  transverse myelitis, infarct, Guillain-Barr # CP- Lexiscan per cardiology today # Headache- likely r/t nitro patch as it started with this  PLAN: MRI C/T/L spine w/wo Unfortunately pt just received Lovenox and we cannot do LP today LP if imaging is neg  Further recommendations based on imaging   Attending neurologist's note to follow  Desiree Metzger-Cihelka, ARNP-C, ANVP-BC Pager: [phone]   NEUROHOSPITALIST ADDENDUM Performed a face to face diagnostic evaluation.   74 year old male with past medical history significant for coronary artery disease, diabetes mellitus, hypertension, hyperlipidemia, obstructive sleep apnea, obesity presents to the emergency department with chest pain, diaphoresis as well as sharp back pain. In the ED chest pain slowly progressed to radiate to the left shoulder and then back.  When patient tried to get up in the emergency room, he fell as his right leg gave out.  This was felt to be radiculopathy-however CTA dissection study was performed which was negative.  Cardiology also evaluated patient and recommended TBI study which ruled out acute right lower extremity ischemia.  Patient was taken to nuclear medicine scan today, prior to the procedure he was able to move the left leg although was complaining of leg numbness.  Postprocedure he was unable to move both legs and then later unable to move both hands.  Neurology was consulted for further recommendations.  On examination, patient has normal cranial nerve exam and normal mental status. On his motor exam-he has 1/5 in bilateral lower extremities, unable to wiggle his toes.  In bilateral upper extremities has distal weakness with weak grip strength.  Normal strength in shoulders, biceps and triceps. His reflexes were 2+ bilateral biceps, 2+ bilateral triceps and 2+ bilateral brachioradialis. He had 4+ patellar reflexes with patellar clonus bilaterally, difficult to get ankle clonus bilaterally.   Increased tone in lower extremities. Sensory exam: Sensory level around T6- T9.  Reduced sensation to light touch, temperature, vibration in bilateral lower extremities.  Differentials at this time is concern for acute spinal pathology-possibly cord compression vs transverse myelitis.  AIDP also consideration due to ascending pattern of weakness, however do not expect intact to brisk reflexes.  Stat MRI of CT and L-spine ordered-we will follow up results and make further plans as needed.  Please hold Lovenox if MRI spine is negative, as he will require lumbar puncture.   CRITICAL CARE Performed by: Lanice Schwab Betzalel Umbarger   Total critical care time: 45  minutes  Critical care time was exclusive of separately billable procedures and treating other patients.  Critical care was necessary to treat or prevent imminent or life-threatening deterioration.  Critical care was time spent personally by me on the following activities: development of treatment plan with patient and/or surrogate as well as nursing, discussions with consultants, evaluation of patient's response to treatment, examination of patient, obtaining history from patient or surrogate, ordering and performing treatments and interventions, ordering and review of laboratory studies, ordering and review of radiographic studies, pulse oximetry and re-evaluation of patient's condition.  Karena Addison Brice Potteiger MD [facility] [phone]   If 7pm to 7am, please call on call as listed on AMION.

## 2020-03-09 NOTE — Progress Notes (Signed)
Patient back from nuclear med. Vital signs obtained. Alert and oriented to room and call light. Call bell within reach.  Paulene Floor, RN

## 2020-03-09 NOTE — Progress Notes (Signed)
Patient feels numbness in abdomen and can not grip with his hands. Notified MD. Will continue to monitor.  Paulene Floor, RN

## 2020-03-10 ENCOUNTER — Inpatient Hospital Stay (HOSPITAL_COMMUNITY): Payer: Medicare Other

## 2020-03-10 LAB — CBC
HCT: 40.1 % (ref 39.0–52.0)
Hemoglobin: 13.1 g/dL (ref 13.0–17.0)
MCH: 29.2 pg (ref 26.0–34.0)
MCHC: 32.7 g/dL (ref 30.0–36.0)
MCV: 89.3 fL (ref 80.0–100.0)
Platelets: 174 10*3/uL (ref 150–400)
RBC: 4.49 MIL/uL (ref 4.22–5.81)
RDW: 13.4 % (ref 11.5–15.5)
WBC: 8.3 10*3/uL (ref 4.0–10.5)
nRBC: 0 % (ref 0.0–0.2)

## 2020-03-10 LAB — COMPREHENSIVE METABOLIC PANEL
ALT: 22 U/L (ref 0–44)
AST: 29 U/L (ref 15–41)
Albumin: 3.7 g/dL (ref 3.5–5.0)
Alkaline Phosphatase: 44 U/L (ref 38–126)
Anion gap: 11 (ref 5–15)
BUN: 37 mg/dL — ABNORMAL HIGH (ref 8–23)
CO2: 21 mmol/L — ABNORMAL LOW (ref 22–32)
Calcium: 9.3 mg/dL (ref 8.9–10.3)
Chloride: 102 mmol/L (ref 98–111)
Creatinine, Ser: 1.62 mg/dL — ABNORMAL HIGH (ref 0.61–1.24)
GFR calc Af Amer: 47 mL/min — ABNORMAL LOW (ref >60)
GFR calc non Af Amer: 41 mL/min — ABNORMAL LOW (ref >60)
Glucose, Bld: 176 mg/dL — ABNORMAL HIGH (ref 70–99)
Potassium: 3.8 mmol/L (ref 3.5–5.1)
Sodium: 134 mmol/L — ABNORMAL LOW (ref 135–145)
Total Bilirubin: 0.6 mg/dL (ref 0.3–1.2)
Total Protein: 6.2 g/dL — ABNORMAL LOW (ref 6.5–8.1)

## 2020-03-10 LAB — GRAM STAIN

## 2020-03-10 LAB — GLUCOSE, CAPILLARY
Glucose-Capillary: 187 mg/dL — ABNORMAL HIGH (ref 70–99)
Glucose-Capillary: 146 mg/dL — ABNORMAL HIGH (ref 70–99)
Glucose-Capillary: 140 mg/dL — ABNORMAL HIGH (ref 70–99)
Glucose-Capillary: 77 mg/dL (ref 70–99)
Glucose-Capillary: 153 mg/dL — ABNORMAL HIGH (ref 70–99)

## 2020-03-10 LAB — CERULOPLASMIN: Ceruloplasmin: 17.7 mg/dL (ref 16.0–31.0)

## 2020-03-10 LAB — PROTEIN, CSF: Total  Protein, CSF: 53 mg/dL — ABNORMAL HIGH (ref 15–45)

## 2020-03-10 LAB — CSF CELL COUNT WITH DIFFERENTIAL
RBC Count, CSF: 13 /mm3 — ABNORMAL HIGH
Tube #: 1
WBC, CSF: 1 /mm3 (ref 0–5)

## 2020-03-10 LAB — GLUCOSE, CSF: Glucose, CSF: 77 mg/dL — ABNORMAL HIGH (ref 40–70)

## 2020-03-10 MED ORDER — SODIUM CHLORIDE 0.9 % IV SOLN
1000.0000 mg | Freq: Every day | INTRAVENOUS | Status: AC
  Administered 2020-03-10 – 2020-03-14 (×5): 1000 mg via INTRAVENOUS
  Filled 2020-03-10 (×5): qty 8

## 2020-03-10 MED ORDER — LORAZEPAM 2 MG/ML IJ SOLN
1.0000 mg | Freq: Once | INTRAMUSCULAR | Status: AC
  Administered 2020-03-10: 1 mg via INTRAVENOUS
  Filled 2020-03-10: qty 1

## 2020-03-10 MED ORDER — LACTATED RINGERS IV SOLN: INTRAVENOUS | Status: DC

## 2020-03-10 MED ORDER — LIDOCAINE HCL (PF) 1 % IJ SOLN
5.0000 mL | Freq: Once | INTRAMUSCULAR | Status: AC
  Administered 2020-03-10: 4 mL

## 2020-03-10 MED ORDER — GADOBUTROL 1 MMOL/ML IV SOLN
9.8000 mL | Freq: Once | INTRAVENOUS | Status: AC | PRN
  Administered 2020-03-10: 9.8 mL via INTRAVENOUS

## 2020-03-10 MED ORDER — PANTOPRAZOLE SODIUM 40 MG PO TBEC
40.0000 mg | DELAYED_RELEASE_TABLET | Freq: Every day | ORAL | Status: DC
  Administered 2020-03-10 – 2020-03-25 (×16): 40 mg via ORAL
  Filled 2020-03-10 (×2): qty 1
  Filled 2020-03-10: qty 2
  Filled 2020-03-10 (×12): qty 1

## 2020-03-10 MED ORDER — SODIUM CHLORIDE 0.9 % IV SOLN
INTRAVENOUS | Status: DC | PRN
  Administered 2020-03-10: 500 mL via INTRAVENOUS
  Administered 2020-03-15 – 2020-03-17 (×2): 250 mL via INTRAVENOUS

## 2020-03-10 MED ORDER — TRAMADOL HCL 50 MG PO TABS
50.0000 mg | ORAL_TABLET | Freq: Four times a day (QID) | ORAL | Status: DC | PRN
  Administered 2020-03-11 – 2020-03-24 (×14): 50 mg via ORAL
  Filled 2020-03-10 (×14): qty 1

## 2020-03-10 MED ORDER — MORPHINE SULFATE (PF) 2 MG/ML IV SOLN
1.0000 mg | Freq: Once | INTRAVENOUS | Status: AC
  Administered 2020-03-10: 1 mg via INTRAVENOUS
  Filled 2020-03-10: qty 1

## 2020-03-10 NOTE — Progress Notes (Signed)
PROGRESS NOTE    Brent [name].  LFY:[id number] DOB: 1946/05/09 DOA: 03/08/2020 PCP: [name], [name], MD    Brief Narrative:  75 y.o. male with medical history significant of remote MI stenting x3 in 1990s, CAD, IDDM, DM neuropathy, sciatica with injection treatment 2 years ago, HTN, HLD, came with new onset of chest pain.  Chest pain happened about patient was in the church and standing.  Pressure-like 8-9 over 10, associated with feeling of nausea and sweating no vomiting no short of breath no palpitations.  EMS arrived and gave patient some aspirin nitroglycerin sublingual with little improvement.  Then upon arrival in the ED the chest pain slowly progressed to radiate to the left shoulder and his back, then slowly subsided he estimated the whole process about an hour.  While staying in the ED patient started to feel weakness of the whole right-sided leg, he remember that while lying on the stretcher in the ambulance, the stretcher had a bump right beneath his right hip area, where he had " bad inflamed nerve which received injection about 2 years ago in [place]".  Although his right leg/buttock symptoms 2 years ago was mainly pain and no weakness or numbness of the right leg compared to this time he feels the whole right leg very discomfort but denies any numbness or loss of feeling but only weakness from hip to toe on the right side.  He denied baseline claudication, he does have chronic back pain on and off.  Patient also admitted he has a baseline diabetic neuropathy with frequent on and off tingling sensation of toes and fingertips. ED Course: Troponin II sets negative, CTA dissection study negative.  Cardiology recommended TBI study to rule out acute right lower extremity ischemia which came back negative.  Assessment & Plan:   Active Problems:   Chest pain   Weakness  Chest pain -Pt has significant prior cardiac history -Given risk factors, Cardiology consulted and pt  underwent nuclear stress test on 6/21, reviewed, with no reversible ischemia or infarct. LVEF of 53%, normal LV wall motion. Low risk study -Continue his cardiac meds as tolerated  Progressive BLE weakness, numbness, loss of B hand grip strength  -new findings since initial presentation -Symptoms initially with RLE, since progressing to involve BLE weakness and B grip strength. Also sensory deficit from nipple line down to feet -Appreciate assistance by Neurology. Spine MRI ordered and reviewed, without unremarkable acute findings noted. -This AM, symptoms remain unchanged -Head CT with f/u LP currently pending per Neurology. Will follow up recs  IDDM -Continue to hold p.o. diabetic medications while in hospital -Continue with SSI coverage as needed -Glycemic trends reviewed, stable  HTN -Continue home meds -BP stable and controlled at this time  HLD -Continue with statin as tolerated  Obesity -Recommend diet/lifestyle modification  Hx malignant melanoma -chart reviewed. Pt s/p biopsy on 10/19, followed by [facility] -Seems to be stable currently  ARF with dehydration -Cr from 0.96 to 1.62 today -Mucus membranes are dry and pt complaining of increased thirst -Have discontinued toradol and lisinopril -Have started LR at 75cc/hr -Would repeat renal panel in AM  DVT prophylaxis: Lovenox subq Code Status: Full Family Communication: Pt in room, family currently not at bedside  Status is: Inpatient  Remains inpatient appropriate because:Ongoing diagnostic testing needed not appropriate for outpatient work up and Inpatient level of care appropriate due to severity of illness  Dispo: The patient is from: Home  Anticipated d/c is to: [facility]              Anticipated d/c date is: 3 days              Patient currently is not medically stable to d/c.     Consultants:   Cardiology  Neurology  Procedures:   Stress test 6/21  Antimicrobials: Anti-infectives (From  admission, onward)   None      Subjective: Remains with BLE marked weakness and numbness from chest down  Objective: Vitals:   03/10/20 0024 03/10/20 0431 03/10/20 0758 03/10/20 1139  BP: (!) 97/50  (!) 100/49 (!) 106/55  Pulse: (!) 57  (!) 58 66  Resp: 19   20  Temp: 98.9 F (37.2 C) 97.7 F (36.5 C) 97.9 F (36.6 C) 98.2 F (36.8 C)  TempSrc: Oral Oral Oral Oral  SpO2: 96%  97% 100%  Weight:      Height:        Intake/Output Summary (Last 24 hours) at 03/10/2020 1333 Last data filed at 03/10/2020 0900 Gross per 24 hour  Intake 600 ml  Output --  Net 600 ml   Filed Weights   03/08/20 1134 03/08/20 2044  Weight: 101.2 kg 98.4 kg    Examination: General exam: Conversant, in no acute distress Respiratory system: normal chest rise, clear, no audible wheezing Cardiovascular system: regular rhythm, s1-s2 Gastrointestinal system: Nondistended, nontender, pos BS Central nervous system: No seizures, no tremors, BLE weakness, decreased sensation from nipple line down, B loss of grip strength Extremities: No cyanosis, no joint deformities Skin: No rashes, no pallor Psychiatry: Affect normal // no auditory hallucinations   Data Reviewed: I have personally reviewed following labs and imaging studies  CBC: Recent Labs  Lab 03/08/20 1142 03/10/20 0319  WBC 7.2 8.3  NEUTROABS 3.8  --   HGB 13.8 13.1  HCT 42.2 40.1  MCV 89.0 89.3  PLT 173 557   Basic Metabolic Panel: Recent Labs  Lab 03/08/20 1142 03/10/20 0319  NA 139 134*  K 4.0 3.8  CL 103 102  CO2 24 21*  GLUCOSE 229* 176*  BUN 18 37*  CREATININE 0.96 1.62*  CALCIUM 9.5 9.3   GFR: Estimated Creatinine Clearance: 47.9 mL/min (A) (by C-G formula based on SCr of 1.62 mg/dL (H)). Liver Function Tests: Recent Labs  Lab 03/08/20 1142 03/10/20 0319  AST 40 29  ALT 30 22  ALKPHOS 71 44  BILITOT 0.8 0.6  PROT 6.5 6.2*  ALBUMIN 4.0 3.7   Recent Labs  Lab 03/08/20 1142  LIPASE 29   No results for  input(s): AMMONIA in the last 168 hours. Coagulation Profile: No results for input(s): INR, PROTIME in the last 168 hours. Cardiac Enzymes: No results for input(s): CKTOTAL, CKMB, CKMBINDEX, TROPONINI in the last 168 hours. BNP (last 3 results) No results for input(s): PROBNP in the last 8760 hours. HbA1C: Recent Labs    03/09/20 0404  HGBA1C 6.7*   CBG: Recent Labs  Lab 03/09/20 1636 03/09/20 2118 03/09/20 2141 03/10/20 0633 03/10/20 1139  GLUCAP 224* 187* 155* 146* 140*   Lipid Profile: No results for input(s): CHOL, HDL, LDLCALC, TRIG, CHOLHDL, LDLDIRECT in the last 72 hours. Thyroid Function Tests: No results for input(s): TSH, T4TOTAL, FREET4, T3FREE, THYROIDAB in the last 72 hours. Anemia Panel: Recent Labs    03/09/20 1823  VITAMINB12 714  FOLATE 45.6   Sepsis Labs: No results for input(s): PROCALCITON, LATICACIDVEN in the last 168 hours.  Recent  Results (from the past 240 hour(s))  SARS Coronavirus 2 by RT PCR (hospital order, performed in [facility] hospital lab) Nasopharyngeal Nasopharyngeal Swab     Status: None   Collection Time: 03/08/20 11:33 AM   Specimen: Nasopharyngeal Swab  Result Value Ref Range Status   SARS Coronavirus 2 NEGATIVE NEGATIVE Final    Comment: (NOTE) SARS-CoV-2 target nucleic acids are NOT DETECTED.  The SARS-CoV-2 RNA is generally detectable in upper and lower respiratory specimens during the acute phase of infection. The lowest concentration of SARS-CoV-2 viral copies this assay can detect is 250 copies / mL. A negative result does not preclude SARS-CoV-2 infection and should not be used as the sole basis for treatment or other patient management decisions.  A negative result may occur with improper specimen collection / handling, submission of specimen other than nasopharyngeal swab, presence of viral mutation(s) within the areas targeted by this assay, and inadequate number of viral copies (<250 copies / mL). A negative  result must be combined with clinical observations, patient history, and epidemiological information.  Fact Sheet for Patients:   StrictlyIdeas.no  Fact Sheet for Healthcare Providers: BankingDealers.co.za  This test is not yet approved or  cleared by the [place] FDA and has been authorized for detection and/or diagnosis of SARS-CoV-2 by FDA under an Emergency Use Authorization (EUA).  This EUA will remain in effect (meaning this test can be used) for the duration of the COVID-19 declaration under Section 564(b)(1) of the Act, 21 U.S.C. section 360bbb-3(b)(1), unless the authorization is terminated or revoked sooner.  Performed at [facility], [place] [address], [place], [place] [address]      Radiology Studies: DG Lumbar Spine 2-3 Views  Result Date: 03/08/2020 CLINICAL DATA:  74 year old male with right lower extremity numbness. EXAM: LUMBAR SPINE - 2-3 VIEW COMPARISON:  Abdominal radiograph dated 11/30/2016 and CT dated 03/08/2020. FINDINGS: Five lumbar type vertebra. There is no acute fracture or subluxation of the lumbar spine. The bones are osteopenic. Multilevel degenerative changes with spurring. There is disc space narrowing at L4-L5. The visualized posterior elements are intact. There is atherosclerotic calcification of the abdominal aorta. Partially visualized high attenuating structure in the left upper abdomen corresponds to the cyst seen on the earlier CT. Excreted contrast noted within the urinary bladder. Linear density over the left lower abdomen may be artifactual and superimposed on the patient. IMPRESSION: 1. No acute fracture or subluxation of the lumbar spine. 2. Osteopenia with multilevel degenerative changes. Electronically Signed   By: Anner Crete M.D.   On: 03/08/2020 19:03   MR CERVICAL SPINE WO CONTRAST  Result Date: 03/09/2020 CLINICAL DATA:  Periodic paralysis EXAM: MRI CERVICAL, THORACIC AND  LUMBAR SPINE WITHOUT CONTRAST TECHNIQUE: Multiplanar and multiecho pulse sequences of the cervical spine, to include the craniocervical junction and cervicothoracic junction, and thoracic and lumbar spine, were obtained without intravenous contrast. COMPARISON:  None. FINDINGS: MRI CERVICAL SPINE FINDINGS Alignment: Physiologic. Vertebrae: No fracture, evidence of discitis, or bone lesion. Cord: Normal signal and morphology. Posterior Fossa, vertebral arteries, paraspinal tissues: Negative. Disc levels: C1-C2: Normal. C2-C3: Small central disc protrusion.  No stenosis. C3-C4: Uncovertebral hypertrophy and right asymmetric disc bulge. Moderate right foraminal stenosis. Moderate right facet hypertrophy. C4-C5: Mild right-greater-than-left facet hypertrophy. Minor disc bulge. Mild right foraminal stenosis. No spinal canal stenosis. C5-C6: Small disc bulge mild bilateral facet hypertrophy. Mild spinal canal stenosis. C6-C7: Small disc bulge with moderate spinal canal stenosis. C7-T1: Small central disc protrusion.  No spinal  canal stenosis. MRI THORACIC SPINE FINDINGS Alignment:  Normal Vertebrae: No fracture, evidence of discitis, or bone lesion. Cord:  Normal Paraspinal and other soft tissues: There is a partially cystic lesion within the left renal lower pole with an internal fluid level. Disc levels: No large disc herniation or spinal canal stenosis. MRI LUMBAR SPINE FINDINGS Segmentation:  Standard Alignment:  Grade 1 anterolisthesis at L4-5 Vertebrae:  No fracture, evidence of discitis, or bone lesion. Conus medullaris and cauda equina: Conus extends to the L1 level. Conus and cauda equina appear normal. Paraspinal and other soft tissues: Negative Disc levels: T12-L1: Normal disc space and facets. No spinal canal or neuroforaminal stenosis. L1-L2: Normal disc space and facets. No spinal canal or neuroforaminal stenosis. L2-L3: Normal disc space and facets. No spinal canal or neuroforaminal stenosis. L3-L4: Minimal  disc bulge.  Mild facet hypertrophy.  No stenosis. L4-L5: Moderate right asymmetric disc bulge with moderate bilateral facet hypertrophy. There is right lateral recess stenosis with mild central spinal canal stenosis. Moderate right foraminal stenosis. L5-S1: Severe facet hypertrophy with small central disc protrusion. No spinal canal stenosis. No neural foraminal stenosis. Visualized sacrum: Normal. IMPRESSION: 1. No acute abnormality of the cervical, thoracic or lumbar spine. 2. Moderate C6-7 and mild C5-6 spinal canal stenosis. 3. Moderate right C3-4 neural foraminal stenosis. 4. Grade 1 anterolisthesis at L4-5 with mild spinal canal and right lateral recess stenosis and moderate right foraminal stenosis. Electronically Signed   By: Ulyses Jarred M.D.   On: 03/09/2020 21:17   MR THORACIC SPINE WO CONTRAST  Result Date: 03/09/2020 CLINICAL DATA:  Periodic paralysis EXAM: MRI CERVICAL, THORACIC AND LUMBAR SPINE WITHOUT CONTRAST TECHNIQUE: Multiplanar and multiecho pulse sequences of the cervical spine, to include the craniocervical junction and cervicothoracic junction, and thoracic and lumbar spine, were obtained without intravenous contrast. COMPARISON:  None. FINDINGS: MRI CERVICAL SPINE FINDINGS Alignment: Physiologic. Vertebrae: No fracture, evidence of discitis, or bone lesion. Cord: Normal signal and morphology. Posterior Fossa, vertebral arteries, paraspinal tissues: Negative. Disc levels: C1-C2: Normal. C2-C3: Small central disc protrusion.  No stenosis. C3-C4: Uncovertebral hypertrophy and right asymmetric disc bulge. Moderate right foraminal stenosis. Moderate right facet hypertrophy. C4-C5: Mild right-greater-than-left facet hypertrophy. Minor disc bulge. Mild right foraminal stenosis. No spinal canal stenosis. C5-C6: Small disc bulge mild bilateral facet hypertrophy. Mild spinal canal stenosis. C6-C7: Small disc bulge with moderate spinal canal stenosis. C7-T1: Small central disc protrusion.  No  spinal canal stenosis. MRI THORACIC SPINE FINDINGS Alignment:  Normal Vertebrae: No fracture, evidence of discitis, or bone lesion. Cord:  Normal Paraspinal and other soft tissues: There is a partially cystic lesion within the left renal lower pole with an internal fluid level. Disc levels: No large disc herniation or spinal canal stenosis. MRI LUMBAR SPINE FINDINGS Segmentation:  Standard Alignment:  Grade 1 anterolisthesis at L4-5 Vertebrae:  No fracture, evidence of discitis, or bone lesion. Conus medullaris and cauda equina: Conus extends to the L1 level. Conus and cauda equina appear normal. Paraspinal and other soft tissues: Negative Disc levels: T12-L1: Normal disc space and facets. No spinal canal or neuroforaminal stenosis. L1-L2: Normal disc space and facets. No spinal canal or neuroforaminal stenosis. L2-L3: Normal disc space and facets. No spinal canal or neuroforaminal stenosis. L3-L4: Minimal disc bulge.  Mild facet hypertrophy.  No stenosis. L4-L5: Moderate right asymmetric disc bulge with moderate bilateral facet hypertrophy. There is right lateral recess stenosis with mild central spinal canal stenosis. Moderate right foraminal stenosis. L5-S1: Severe facet hypertrophy with small central  disc protrusion. No spinal canal stenosis. No neural foraminal stenosis. Visualized sacrum: Normal. IMPRESSION: 1. No acute abnormality of the cervical, thoracic or lumbar spine. 2. Moderate C6-7 and mild C5-6 spinal canal stenosis. 3. Moderate right C3-4 neural foraminal stenosis. 4. Grade 1 anterolisthesis at L4-5 with mild spinal canal and right lateral recess stenosis and moderate right foraminal stenosis. Electronically Signed   By: Ulyses Jarred M.D.   On: 03/09/2020 21:17   MR LUMBAR SPINE WO CONTRAST  Result Date: 03/09/2020 CLINICAL DATA:  Periodic paralysis EXAM: MRI CERVICAL, THORACIC AND LUMBAR SPINE WITHOUT CONTRAST TECHNIQUE: Multiplanar and multiecho pulse sequences of the cervical spine, to  include the craniocervical junction and cervicothoracic junction, and thoracic and lumbar spine, were obtained without intravenous contrast. COMPARISON:  None. FINDINGS: MRI CERVICAL SPINE FINDINGS Alignment: Physiologic. Vertebrae: No fracture, evidence of discitis, or bone lesion. Cord: Normal signal and morphology. Posterior Fossa, vertebral arteries, paraspinal tissues: Negative. Disc levels: C1-C2: Normal. C2-C3: Small central disc protrusion.  No stenosis. C3-C4: Uncovertebral hypertrophy and right asymmetric disc bulge. Moderate right foraminal stenosis. Moderate right facet hypertrophy. C4-C5: Mild right-greater-than-left facet hypertrophy. Minor disc bulge. Mild right foraminal stenosis. No spinal canal stenosis. C5-C6: Small disc bulge mild bilateral facet hypertrophy. Mild spinal canal stenosis. C6-C7: Small disc bulge with moderate spinal canal stenosis. C7-T1: Small central disc protrusion.  No spinal canal stenosis. MRI THORACIC SPINE FINDINGS Alignment:  Normal Vertebrae: No fracture, evidence of discitis, or bone lesion. Cord:  Normal Paraspinal and other soft tissues: There is a partially cystic lesion within the left renal lower pole with an internal fluid level. Disc levels: No large disc herniation or spinal canal stenosis. MRI LUMBAR SPINE FINDINGS Segmentation:  Standard Alignment:  Grade 1 anterolisthesis at L4-5 Vertebrae:  No fracture, evidence of discitis, or bone lesion. Conus medullaris and cauda equina: Conus extends to the L1 level. Conus and cauda equina appear normal. Paraspinal and other soft tissues: Negative Disc levels: T12-L1: Normal disc space and facets. No spinal canal or neuroforaminal stenosis. L1-L2: Normal disc space and facets. No spinal canal or neuroforaminal stenosis. L2-L3: Normal disc space and facets. No spinal canal or neuroforaminal stenosis. L3-L4: Minimal disc bulge.  Mild facet hypertrophy.  No stenosis. L4-L5: Moderate right asymmetric disc bulge with moderate  bilateral facet hypertrophy. There is right lateral recess stenosis with mild central spinal canal stenosis. Moderate right foraminal stenosis. L5-S1: Severe facet hypertrophy with small central disc protrusion. No spinal canal stenosis. No neural foraminal stenosis. Visualized sacrum: Normal. IMPRESSION: 1. No acute abnormality of the cervical, thoracic or lumbar spine. 2. Moderate C6-7 and mild C5-6 spinal canal stenosis. 3. Moderate right C3-4 neural foraminal stenosis. 4. Grade 1 anterolisthesis at L4-5 with mild spinal canal and right lateral recess stenosis and moderate right foraminal stenosis. Electronically Signed   By: Ulyses Jarred M.D.   On: 03/09/2020 21:17   NM Myocar Multi W/Spect W/Wall Motion / EF  Result Date: 03/09/2020 CLINICAL DATA:  Recent onset of chest pain. History diabetes, remote myocardial infarction and coronary stenting. EXAM: MYOCARDIAL IMAGING WITH SPECT (REST AND PHARMACOLOGIC-STRESS) GATED LEFT VENTRICULAR WALL MOTION STUDY LEFT VENTRICULAR EJECTION FRACTION TECHNIQUE: Standard myocardial SPECT imaging was performed after resting intravenous injection of 11 mCi Tc-65m tetrofosmin. Subsequently, intravenous infusion of Lexiscan was performed under the supervision of the Cardiology staff. At peak effect of the drug, 29 mCi Tc-54m tetrofosmin was injected intravenously and standard myocardial SPECT imaging was performed. Quantitative gated imaging was also performed to evaluate left ventricular  wall motion, and estimate left ventricular ejection fraction. COMPARISON:  Images from nuclear medicine myocardial perfusion study 09/25/2013 without report FINDINGS: Perfusion: No decreased activity in the left ventricle on stress imaging to suggest reversible ischemia or infarction. Mildly prominent subdiaphragmatic activity on the rest images. Wall Motion: Normal left ventricular wall motion. No left ventricular dilation. Left Ventricular Ejection Fraction: 53 % End diastolic volume 91 ml  End systolic volume 43 ml IMPRESSION: 1. No reversible ischemia or infarction. 2. Normal left ventricular wall motion. 3. Left ventricular ejection fraction 53% 4. Non invasive risk stratification*: Low *2012 Appropriate Use Criteria for Coronary Revascularization Focused Update: J Am Coll Cardiol. 3545;62(5):638-937. http://content.airportbarriers.com.aspx?articleid=[email or web address] Electronically Signed   By: Richardean Sale M.D.   On: 03/09/2020 16:43   VAS [place] ABI WITH/WO TBI  Result Date: 03/09/2020 LOWER EXTREMITY DOPPLER STUDY Indications: Cold right foot, unable to move right leg, hip pain. High Risk Factors: Hypertension, hyperlipidemia, Diabetes, past history of                    smoking, coronary artery disease. Other Factors: History of lumbar issues.  Comparison Study: No prior study on file Performing Technologist: Sharion Dove RVS  Examination Guidelines: A complete evaluation includes at minimum, Doppler waveform signals and systolic blood pressure reading at the level of bilateral brachial, anterior tibial, and posterior tibial arteries, when vessel segments are accessible. Bilateral testing is considered an integral part of a complete examination. Photoelectric Plethysmograph (PPG) waveforms and toe systolic pressure readings are included as required and additional duplex testing as needed. Limited examinations for reoccurring indications may be performed as noted.  ABI Findings: +---------+------------------+-----+-----------+--------+ Right    Rt Pressure (mmHg)IndexWaveform   Comment  +---------+------------------+-----+-----------+--------+ Brachial 161                    triphasic           +---------+------------------+-----+-----------+--------+ Popliteal                       multiphasic         +---------+------------------+-----+-----------+--------+ PTA      197               1.22 multiphasic         +---------+------------------+-----+-----------+--------+ DP        201               1.25 multiphasic         +---------+------------------+-----+-----------+--------+ Great Toe122               0.76                     +---------+------------------+-----+-----------+--------+ +---------+------------------+-----+-----------+-------+ Left     Lt Pressure (mmHg)IndexWaveform   Comment +---------+------------------+-----+-----------+-------+ Brachial 144                    triphasic          +---------+------------------+-----+-----------+-------+ PTA      196               1.22 multiphasic        +---------+------------------+-----+-----------+-------+ DP       191               1.19 multiphasic        +---------+------------------+-----+-----------+-------+ Great Toe115               0.71                    +---------+------------------+-----+-----------+-------+ +-------+-----------+-----------+------------+------------+  ABI/TBIToday's ABIToday's TBIPrevious ABIPrevious TBI +-------+-----------+-----------+------------+------------+ Right  1.2        0.76                                +-------+-----------+-----------+------------+------------+ Left   1.2        0.71                                +-------+-----------+-----------+------------+------------+  Summary: Right: Resting right ankle-brachial index is within normal range. No evidence of significant right lower extremity arterial disease. The right toe-brachial index is normal. Left: Resting left ankle-brachial index is within normal range. No evidence of significant left lower extremity arterial disease. The left toe-brachial index is normal.  *See table(s) above for measurements and observations.  Electronically signed by Ruta Hinds MD on 03/09/2020 at 3:38:09 PM.    Final     Scheduled Meds: . aspirin EC  81 mg Oral QHS  . fenofibrate  160 mg Oral QHS  . fluticasone  2 spray Each Nare QHS  . icosapent Ethyl  2 g Oral BID  . insulin aspart  0-5  Units Subcutaneous QHS  . insulin aspart  0-9 Units Subcutaneous TID WC  . insulin aspart  25 Units Subcutaneous TID AC  . insulin glargine  60 Units Subcutaneous QHS  . lidocaine  1 patch Transdermal Q24H  . multivitamin with minerals  1 tablet Oral Daily  . nitroGLYCERIN  1 inch Topical Q6H  . oxybutynin  5 mg Oral BID  . rosuvastatin  40 mg Oral QHS  . sertraline  150 mg Oral QHS  . vitamin B-12  500 mcg Oral Daily  . zolpidem  10 mg Oral QHS   Continuous Infusions: . lactated ringers 75 mL/hr at 03/10/20 1304     LOS: 1 day   Marylu Lund, MD [facility] Pager On Amion  If 7PM-7AM, please contact night-coverage 03/10/2020, 1:33 PM

## 2020-03-10 NOTE — Progress Notes (Signed)
Pt able to perform NIF of -40 with great effort.

## 2020-03-10 NOTE — Progress Notes (Signed)
Pt came back to rm 4 from MRI. Restarted fluid. VSS.  Call bell within reach.  Lavenia Atlas, RN

## 2020-03-10 NOTE — Progress Notes (Signed)
OT Cancellation Note  Patient Details Name: Brent [name]. MRN: [id number] DOB: 05-22-1946   Cancelled Treatment:    Reason Eval/Treat Not Completed: Patient at procedure or test/ unavailable (CT); will follow up for OT eval as able.  Lou Cal, OT Acute Rehabilitation Services Pager [phone] Office [phone]   Raymondo Band 03/10/2020, 4:11 PM

## 2020-03-10 NOTE — Procedures (Signed)
Technically successful L2-L3 fluoroscopically guided lumbar puncture. 12 mL CSF sent to lab for analysis. No immediate post-procedure complication.

## 2020-03-10 NOTE — Progress Notes (Addendum)
Reason for consult:.  Acute onset progressive quadriparesis  Subjective: Patient is awake and alert and oriented x3.  No worsening of weakness in bilateral upper extremities and lower extremities remain plegic   ROS: negative except above   Examination  Vital signs in last 24 hours: Temp:  [97.7 F (36.5 C)-98.9 F (37.2 C)] 98.2 F (36.8 C) (06/22 1139) Pulse Rate:  [57-72] 66 (06/22 1139) Resp:  [14-20] 20 (06/22 1139) BP: (97-111)/(49-65) 106/55 (06/22 1139) SpO2:  [96 %-100 %] 100 % (06/22 1139)  General: lying in bed CVS: pulse-normal rate and rhythm RS: breathing comfortably Extremities: normal   Neuro: MS: Alert oriented x3 CN: Normal 2-12 Motor exam: LLE : 1/5 RLE 1/5 RUE: 4+/5 proximal muscles, wrist drop LUE: 4+/5 proximal muscles, wrist drop Reflexes:  2+ bilateral biceps, 2+ bilateral triceps and 2+ bilateral brachioradialis. He had 4+ patellar reflexes with patellar clonus bilaterally, difficult to get ankle clonus bilaterally.  Increased tone in lower extremities. Sensory exam: Sensory level around T6- T9.  Reduced sensation to light touch, temperature, vibration in bilateral lower extremities. Coordination: No gross ataxia noted Gait: unable to walk  Basic Metabolic Panel: Recent Labs  Lab 03/08/20 1142 03/10/20 0319  NA 139 134*  K 4.0 3.8  CL 103 102  CO2 24 21*  GLUCOSE 229* 176*  BUN 18 37*  CREATININE 0.96 1.62*  CALCIUM 9.5 9.3    CBC: Recent Labs  Lab 03/08/20 1142 03/10/20 0319  WBC 7.2 8.3  NEUTROABS 3.8  --   HGB 13.8 13.1  HCT 42.2 40.1  MCV 89.0 89.3  PLT 173 174     Coagulation Studies: No results for input(s): LABPROT, INR in the last 72 hours.  Imaging Reviewed:     ASSESSMENT AND PLAN  74 year old male with past medical history significant for coronary artery disease, diabetes mellitus, hypertension, hyperlipidemia, obstructive sleep apnea, obesity presents to the emergency department with chest pain,  diaphoresis as well as sharp back pain-CTA ruled out dissection. Subsequently had progressive weakness, starting with right leg and left leg and then bilateral distal upper extremity weakness.  Sensory level T6-T9.  Stat MRI C, T, L spine performed which was reported as negative yesterday: however, on my review shows subtle signal change or cervical, thoracic spine.  I discussed with neuro rads today also agree there is subtle signal abnormality in thoracic spine.  Unfortunately patient could not cooperate for contrasted MRI and therefore we need to repeat today.  LP has been scheduled-I do think he still warrants it to assess of this inflammatory process. Less likely on the differential is cord infarct-however this should not cause progressive weakness.  Suspicion for Acute transverse myelitis  -LP under fluoroscopy to assess for protein, cell count -Repeat MRI C and T-spine with contrast -CT head before performing LP -We will plan to start IV Solu-Medrol 1 g x 5 days.  -NIF every 12 hours   CRITICAL CARE Performed by: Lanice Schwab Yaviel Kloster   Total critical care time: 45 minutes  Critical care time was exclusive of separately billable procedures and treating other patients.  Critical care was necessary to treat or prevent imminent or life-threatening deterioration.  Critical care was time spent personally by me on the following activities: development of treatment plan with patient and/or surrogate as well as nursing, discussions with consultants, evaluation of patient's response to treatment, examination of patient, obtaining history from patient or surrogate, ordering and performing treatments and interventions, ordering and review of laboratory studies, ordering  and review of radiographic studies, pulse oximetry and re-evaluation of patient's condition.   Addendum -Patient underwent LP, WBC count 1 which would be unusual for transverse myelitis.  Protein only slightly elevated at  53. -MRI C and T-spine with contrast does not show any new enhancement.    Karena Addison Jilleen Essner [facility] Pager Number [phone] For questions after 7pm please refer to AMION to reach the Neurologist on call

## 2020-03-10 NOTE — Progress Notes (Signed)
Inpatient Rehab Admissions Coordinator Note:   Per PT recommendations, pt was screened for CIR candidacy by Shann Medal, PT, DPT.  At this time we are recommending a CIR consult.  I will place an order per our protocol.  Please contact me with questions.   Shann Medal, PT, DPT [phone] 03/10/20 4:19 PM

## 2020-03-10 NOTE — Progress Notes (Signed)
Pt NIF greater than -40 with excellent effort.

## 2020-03-10 NOTE — Evaluation (Signed)
Physical Therapy Evaluation Patient Details Name: Brent [name]. MRN: [id number] DOB: 26-Sep-1945 Today's Date: 03/10/2020   History of Present Illness  Pt adm 6/20 with chest pain and developed RLE weakness in ED. Pt for nuclear stress test on 6/21 where his weakness progressed to BLE's and Bil hands. Neuro consulted and work up in progress. PMH - CAD, DM, peripheral neuropathy, HTN, MI, obesity  Clinical Impression  Pt presents to PT with no lower extremity or trunk (below nipple line) motor function bilaterally, no motor function of hands, extensor spasms of LE's, decr sensation of trunk and BLE's. Pt will need extensive rehab for this. Will be high risk for skin breakdown and I asked nursing to have him moved to air bed. Feel pt will need CIR.     Follow Up Recommendations CIR    Equipment Recommendations  Other (comment) (To be determined at next venue)    Recommendations for Other Services       Precautions / Restrictions Precautions Precautions: Fall      Mobility  Bed Mobility               General bed mobility comments: Not attempted with 1 person due to profound weakness. Assume +2 total assist  Transfers                 General transfer comment: Not attempted  Ambulation/Gait                Stairs            Wheelchair Mobility    Modified Rankin (Stroke Patients Only)       Balance                                             Pertinent Vitals/Pain Pain Assessment: Faces Faces Pain Scale: Hurts a little bit Pain Location: head and neck Pain Descriptors / Indicators: Aching Pain Intervention(s): Monitored during session    Home Living Family/patient expects to be discharged to:: Private residence Living Arrangements: Spouse/significant other;Other relatives (18 year old grandson) Available Help at Discharge: Family;Available 24 hours/day Type of Home: House Home Access: Stairs to enter Entrance  Stairs-Rails: None Entrance Stairs-Number of Steps: 2 Home Layout: Two level;1/2 bath on main level;Bed/bath upstairs Home Equipment: None      Prior Function Level of Independence: Independent               Hand Dominance   Dominant Hand: Left    Extremity/Trunk Assessment   Upper Extremity Assessment Upper Extremity Assessment: Defer to OT evaluation    Lower Extremity Assessment Lower Extremity Assessment: RLE deficits/detail;LLE deficits/detail RLE Deficits / Details: Strength 0/5. Passive range of motion WFL but difficult due to extensor spasms RLE Sensation: decreased light touch;decreased proprioception;history of peripheral neuropathy LLE Deficits / Details: Strength 0/5. Passive range of motion WFL but difficult due to extensor spasms LLE Sensation: decreased light touch;decreased proprioception;history of peripheral neuropathy       Communication   Communication: No difficulties  Cognition Arousal/Alertness: Awake/alert Behavior During Therapy: WFL for tasks assessed/performed Overall Cognitive Status: Within Functional Limits for tasks assessed                                        General Comments  Exercises     Assessment/Plan    PT Assessment Patient needs continued PT services  PT Problem List Decreased strength;Decreased balance;Decreased mobility;Impaired sensation;Impaired tone       PT Treatment Interventions DME instruction;Functional mobility training;Therapeutic activities;Therapeutic exercise;Balance training;Patient/family education;Wheelchair mobility training    PT Goals (Current goals can be found in the Care Plan section)  Acute Rehab PT Goals Patient Stated Goal: return home PT Goal Formulation: With patient Time For Goal Achievement: 03/24/20 Potential to Achieve Goals: Good    Frequency Min 3X/week   Barriers to discharge Inaccessible home environment stairs to enter and 2 level home     Co-evaluation               AM-PAC PT "6 Clicks" Mobility  Outcome Measure Help needed turning from your back to your side while in a flat bed without using bedrails?: Total Help needed moving from lying on your back to sitting on the side of a flat bed without using bedrails?: Total Help needed moving to and from a bed to a chair (including a wheelchair)?: Total Help needed standing up from a chair using your arms (e.g., wheelchair or bedside chair)?: Total Help needed to walk in hospital room?: Total Help needed climbing 3-5 steps with a railing? : Total 6 Click Score: 6    End of Session   Activity Tolerance: Patient tolerated treatment well Patient left: in bed;with call bell/phone within reach;with nursing/sitter in room Nurse Communication: Mobility status;Need for lift equipment;Other (comment) (Need for air mattress) PT Visit Diagnosis: Other abnormalities of gait and mobility (R26.89);Other symptoms and signs involving the nervous system (R29.898)    Time: 3267-1245 PT Time Calculation (min) (ACUTE ONLY): 19 min   Charges:   PT Evaluation $PT Eval Moderate Complexity: [place] Pager [phone] Office [place] 03/10/2020, 2:17 PM

## 2020-03-11 ENCOUNTER — Inpatient Hospital Stay (HOSPITAL_COMMUNITY): Payer: Medicare Other

## 2020-03-11 DIAGNOSIS — E871 Hypo-osmolality and hyponatremia: Secondary | ICD-10-CM

## 2020-03-11 DIAGNOSIS — N179 Acute kidney failure, unspecified: Secondary | ICD-10-CM

## 2020-03-11 DIAGNOSIS — R739 Hyperglycemia, unspecified: Secondary | ICD-10-CM

## 2020-03-11 DIAGNOSIS — T380X5A Adverse effect of glucocorticoids and synthetic analogues, initial encounter: Secondary | ICD-10-CM

## 2020-03-11 DIAGNOSIS — I2510 Atherosclerotic heart disease of native coronary artery without angina pectoris: Secondary | ICD-10-CM

## 2020-03-11 DIAGNOSIS — E1142 Type 2 diabetes mellitus with diabetic polyneuropathy: Secondary | ICD-10-CM

## 2020-03-11 DIAGNOSIS — G8220 Paraplegia, unspecified: Secondary | ICD-10-CM

## 2020-03-11 DIAGNOSIS — R0682 Tachypnea, not elsewhere classified: Secondary | ICD-10-CM

## 2020-03-11 DIAGNOSIS — G8250 Quadriplegia, unspecified: Secondary | ICD-10-CM

## 2020-03-11 DIAGNOSIS — E785 Hyperlipidemia, unspecified: Secondary | ICD-10-CM

## 2020-03-11 LAB — COMPREHENSIVE METABOLIC PANEL
ALT: 24 U/L (ref 0–44)
AST: 32 U/L (ref 15–41)
Albumin: 3.6 g/dL (ref 3.5–5.0)
Alkaline Phosphatase: 45 U/L (ref 38–126)
Anion gap: 12 (ref 5–15)
BUN: 60 mg/dL — ABNORMAL HIGH (ref 8–23)
CO2: 21 mmol/L — ABNORMAL LOW (ref 22–32)
Calcium: 8.9 mg/dL (ref 8.9–10.3)
Chloride: 100 mmol/L (ref 98–111)
Creatinine, Ser: 1.74 mg/dL — ABNORMAL HIGH (ref 0.61–1.24)
GFR calc Af Amer: 43 mL/min — ABNORMAL LOW (ref >60)
GFR calc non Af Amer: 38 mL/min — ABNORMAL LOW (ref >60)
Glucose, Bld: 189 mg/dL — ABNORMAL HIGH (ref 70–99)
Potassium: 4.3 mmol/L (ref 3.5–5.1)
Sodium: 133 mmol/L — ABNORMAL LOW (ref 135–145)
Total Bilirubin: 0.6 mg/dL (ref 0.3–1.2)
Total Protein: 6.3 g/dL — ABNORMAL LOW (ref 6.5–8.1)

## 2020-03-11 LAB — CBC
HCT: 40.4 % (ref 39.0–52.0)
Hemoglobin: 13.4 g/dL (ref 13.0–17.0)
MCH: 29.2 pg (ref 26.0–34.0)
MCHC: 33.2 g/dL (ref 30.0–36.0)
MCV: 88 fL (ref 80.0–100.0)
Platelets: 183 10*3/uL (ref 150–400)
RBC: 4.59 MIL/uL (ref 4.22–5.81)
RDW: 13.2 % (ref 11.5–15.5)
WBC: 9.1 10*3/uL (ref 4.0–10.5)
nRBC: 0 % (ref 0.0–0.2)

## 2020-03-11 LAB — GLUCOSE, CAPILLARY
Glucose-Capillary: 195 mg/dL — ABNORMAL HIGH (ref 70–99)
Glucose-Capillary: 232 mg/dL — ABNORMAL HIGH (ref 70–99)
Glucose-Capillary: 303 mg/dL — ABNORMAL HIGH (ref 70–99)
Glucose-Capillary: 423 mg/dL — ABNORMAL HIGH (ref 70–99)

## 2020-03-11 MED ORDER — FENTANYL CITRATE (PF) 100 MCG/2ML IJ SOLN
25.0000 ug | Freq: Once | INTRAMUSCULAR | Status: AC
  Administered 2020-03-11: 25 ug via INTRAVENOUS
  Filled 2020-03-11: qty 2

## 2020-03-11 MED ORDER — INSULIN GLARGINE 100 UNIT/ML ~~LOC~~ SOLN
10.0000 [IU] | Freq: Every day | SUBCUTANEOUS | Status: DC
  Administered 2020-03-11: 10 [IU] via SUBCUTANEOUS
  Filled 2020-03-11 (×2): qty 0.1

## 2020-03-11 MED ORDER — LORAZEPAM 2 MG/ML IJ SOLN
1.5000 mg | Freq: Once | INTRAMUSCULAR | Status: AC
  Administered 2020-03-11: 1.5 mg via INTRAVENOUS
  Filled 2020-03-11: qty 1

## 2020-03-11 MED ORDER — SODIUM CHLORIDE 0.9 % IV SOLN: INTRAVENOUS | Status: DC

## 2020-03-11 NOTE — Evaluation (Addendum)
Occupational Therapy Evaluation Patient Details Name: Brent [name]. MRN: [id number] DOB: 10/04/1945 Today's Date: 03/11/2020    History of Present Illness Pt adm 6/20 with chest pain and developed RLE weakness in ED. Pt for nuclear stress test on 6/21 where his weakness progressed to BLE's and Bil hands. Neuro consulted and work up in progress. PMH - CAD, DM, peripheral neuropathy, HTN, MI, obesity   Clinical Impression   PTA pt living with family, independent for BADL/IADL. At time of eval, pt completed bed mobility with total A +2- currently limited by extensor tone and BLE spasms. Pt returned supine and placed in chair position to facilitate flexion to help inhibit tone. Per BUE assessment, noted RUE to be stronger than L (dominant) extremity. Some trace movement noted with hip flexion on R hip. Pt currently does not have functional dexterity in bil hands for BADLs. He reports needing assist for feeding, using his phone, and pressing call bell at this time. Issued soft touch call bell and began discussing AE for improves independence. Educated pt on ways to make phone more accessible with voice activated functions. Pt is highly motivated to return to baseline. Given current status, recommend CIR for patient to continue intensive neurological rehab for pt. Will continue to follow per POC listed below.    Follow Up Recommendations  CIR    Equipment Recommendations  Other (comment) (TBD)    Recommendations for Other Services Rehab consult     Precautions / Restrictions Precautions Precautions: Fall Precaution Comments: no sensation below nipple line; R side stronger; extensor tone with transfer Restrictions Weight Bearing Restrictions: No      Mobility Bed Mobility Overal bed mobility: Needs Assistance Bed Mobility: Supine to Sit;Sit to Supine     Supine to sit: Total assist;+2 for physical assistance;+2 for safety/equipment Sit to supine: Total assist;+2 for physical  assistance;+2 for safety/equipment   General bed mobility comments: total A to come to EOB to support BLEs and trunk. Upon sitting pt assumed full body extension, needing to immediately lie back down and reposition in bed  Transfers                 General transfer comment: Not attempted    Balance Overall balance assessment: Needs assistance Sitting-balance support: Feet supported Sitting balance-Leahy Scale: Zero Sitting balance - Comments: extensor tone limiting                                   ADL either performed or assessed with clinical judgement   ADL Overall ADL's : Needs assistance/impaired Eating/Feeding: Minimal assistance;With adaptive utensils;With assist to don/doff brace/orthosis;Bed level Eating/Feeding Details (indicate cue type and reason): will need feeding AE to efficiently feed self Grooming: Minimal assistance;Bed level   Upper Body Bathing: Maximal assistance;Sitting;Bed level   Lower Body Bathing: Total assistance;Sitting/lateral leans;Sit to/from stand;Bed level   Upper Body Dressing : Sitting;Bed level;Maximal assistance   Lower Body Dressing: Total assistance;Sitting/lateral leans;Sit to/from stand;Bed level   Toilet Transfer: Total assistance Toilet Transfer Details (indicate cue type and reason): assumes full extensor tone when taken EOB Toileting- Clothing Manipulation and Hygiene: Total assistance         General ADL Comments: pt limited in transfers due to extensor tone     Vision Baseline Vision/History: Wears glasses Wears Glasses: At all times Patient Visual Report: No change from baseline       Perception  Praxis      Pertinent Vitals/Pain Pain Assessment: Faces Faces Pain Scale: Hurts a little bit Pain Location: head and neck Pain Descriptors / Indicators: Aching Pain Intervention(s): Monitored during session     Hand Dominance Left   Extremity/Trunk Assessment Upper Extremity  Assessment Upper Extremity Assessment: RUE deficits/detail;LUE deficits/detail RUE Deficits / Details: strength 5/5 in shoulder, biceps, triceps. Able to moblize fingers slightly but no functional grasp. Sensation appears WFL LUE Deficits / Details: strength 5/5 shoulder, 3/5 tricep, 5/5 bicep. No functional hand movement noted   Lower Extremity Assessment Lower Extremity Assessment: Defer to PT evaluation       Communication Communication Communication: No difficulties   Cognition Arousal/Alertness: Awake/alert Behavior During Therapy: WFL for tasks assessed/performed Overall Cognitive Status: Within Functional Limits for tasks assessed                                     General Comments       Exercises Other Exercises Other Exercises: Educated on weight shifting by hooking arms on bed rail for pressure relief   Shoulder Instructions      Home Living Family/patient expects to be discharged to:: Private residence Living Arrangements: Spouse/significant other;Other relatives (76 y/o grandson) Available Help at Discharge: Family;Available 24 hours/day Type of Home: House Home Access: Stairs to enter [employer] of Steps: 2 Entrance Stairs-Rails: None Home Layout: Two level;1/2 bath on main level;Bed/bath upstairs Alternate Level Stairs-Number of Steps: flight   Bathroom Shower/Tub: Teacher, early years/pre: Standard     Home Equipment: None          Prior Functioning/Environment Level of Independence: Independent                 OT Problem List: Decreased strength;Decreased knowledge of use of DME or AE;Decreased coordination;Decreased activity tolerance;Impaired UE functional use;Impaired balance (sitting and/or standing)      OT Treatment/Interventions: Self-care/ADL training;Therapeutic exercise;Patient/family education;Balance training;Energy conservation;Therapeutic activities;Neuromuscular education;DME and/or AE  instruction    OT Goals(Current goals can be found in the care plan section) Acute Rehab OT Goals Patient Stated Goal: return to independence OT Goal Formulation: With patient Time For Goal Achievement: 03/25/20 Potential to Achieve Goals: Good  OT Frequency: Min 2X/week   Barriers to D/C:            Co-evaluation              AM-PAC OT "6 Clicks" Daily Activity     Outcome Measure Help from another person eating meals?: A Little Help from another person taking care of personal grooming?: A Little Help from another person toileting, which includes using toliet, bedpan, or urinal?: Total Help from another person bathing (including washing, rinsing, drying)?: Total Help from another person to put on and taking off regular upper body clothing?: A Lot Help from another person to put on and taking off regular lower body clothing?: Total 6 Click Score: 11   End of Session Nurse Communication: Mobility status;Other (comment) (soft touch call bell)  Activity Tolerance: Patient tolerated treatment well Patient left: in bed;with call bell/phone within reach  OT Visit Diagnosis: Other abnormalities of gait and mobility (R26.89);Muscle weakness (generalized) (M62.81);Other symptoms and signs involving the nervous system (R29.898)                Time: 2585-2778 OT Time Calculation (min): 54 min Charges:  OT General Charges $OT Visit: 1 Visit  OT Evaluation $OT Eval Moderate Complexity: 1 Mod OT Treatments $Self Care/Home Management : 38-52 mins  Zenovia Jarred, MSOT, OTR/L Acute Rehabilitation Services [facility] Office Number: [phone] Pager: [phone]  Zenovia Jarred 03/11/2020, 1:21 PM

## 2020-03-11 NOTE — Progress Notes (Signed)
PROGRESS NOTE    Brent [name].  MVE:[id number] DOB: 1946-04-11 DOA: 03/08/2020 PCP: [name], [name], MD    Brief Narrative:  74 y.o. male with medical history significant of remote MI stenting x3 in 1990s, CAD, IDDM, DM neuropathy, sciatica with injection treatment 2 years ago, HTN, HLD, came with new onset of chest pain.  Chest pain happened about patient was in the church and standing.  Pressure-like 8-9 over 10, associated with feeling of nausea and sweating no vomiting no short of breath no palpitations.  EMS arrived and gave patient some aspirin nitroglycerin sublingual with little improvement.  Then upon arrival in the ED the chest pain slowly progressed to radiate to the left shoulder and his back, then slowly subsided he estimated the whole process about an hour.  While staying in the ED patient started to feel weakness of the whole right-sided leg, he remember that while lying on the stretcher in the ambulance, the stretcher had a bump right beneath his right hip area, where he had " bad inflamed nerve which received injection about 2 years ago in [place]".  Although his right leg/buttock symptoms 2 years ago was mainly pain and no weakness or numbness of the right leg compared to this time he feels the whole right leg very discomfort but denies any numbness or loss of feeling but only weakness from hip to toe on the right side.  He denied baseline claudication, he does have chronic back pain on and off.  Patient also admitted he has a baseline diabetic neuropathy with frequent on and off tingling sensation of toes and fingertips. ED Course: Troponin II sets negative, CTA dissection study negative.  Cardiology recommended TBI study to rule out acute right lower extremity ischemia which came back negative.  Assessment & Plan:   Active Problems:   Chest pain   Weakness  Chest pain -Pt has significant prior cardiac history -Given risk factors, Cardiology consulted and pt  underwent nuclear stress test on 6/21, reviewed, with no reversible ischemia or infarct. LVEF of 53%, normal LV wall motion. Low risk study -Continue his cardiac meds as tolerated  Progressive BLE weakness, numbness, loss of B hand grip strength  -new findings since initial presentation -Symptoms initially with RLE, since progressing to involve BLE weakness and B grip strength.  -Appreciate assistance by Neurology.  Spine MRI and LP done and arranged by neurology.  He was started on high-dose Solu-Medrol for 5 days with first day starting on 03/10/2020.  Management deferred to neurology.  Appreciate their help.  IDDM/type 2 diabetes mellitus -Continue to hold p.o. diabetic medications while in hospital -Continue with SSI coverage as needed, premeal NovoLog and now that his blood sugar is slightly elevated which was expected and I expect more radiation tomorrow so I will start him on Lantus 10 units as well.  Essential HTN -Continue home meds -BP stable and controlled at this time  HLD -Continue with statin as tolerated  Obesity -Recommend diet/lifestyle modification  Hx malignant melanoma -chart reviewed. Pt s/p biopsy on 10/19, followed by [facility] -Seems to be stable currently  AKI/dehydration: Came in with normal GFR.  Creatinine jumped to 1.62 and now to 1.74.  He is on Ringer's lactate at 75 cc/h.  We will switch to normal saline and increase frequency to 125 cc/h.  Repeat labs in the morning.  Electrolytes are stable.  DVT prophylaxis: Now on SCD, likely due to having LP yesterday.  Will wait for neurology clearance before  resuming back on Lovenox. Code Status: Full Family Communication: Pt in room, family currently not at bedside  Status is: Inpatient  Remains inpatient appropriate because:Ongoing diagnostic testing needed not appropriate for outpatient work up and Inpatient level of care appropriate due to severity of illness  Dispo: The patient is from: Home               Anticipated d/c is to: [facility]              Anticipated d/c date is: 3 days              Patient currently is not medically stable to d/c.     Consultants:   Cardiology  Neurology  Procedures:   Stress test 6/21  Antimicrobials: Anti-infectives (From admission, onward)   None      Subjective: Seen and examined.  Does not feel any difference.  Continues to have weakness in the lower extremities and weakness in the hand grip.  No problem in the sensation.  Objective: Vitals:   03/11/20 0026 03/11/20 0459 03/11/20 0805 03/11/20 1156  BP: (!) 107/49 (!) 114/58 122/60 (!) 118/58  Pulse: 62 60 60 61  Resp: 17 14 20 17   Temp: 98.6 F (37 C) 98.2 F (36.8 C) 97.6 F (36.4 C) 98.1 F (36.7 C)  TempSrc: Oral Oral Oral Oral  SpO2: 94% 94% 95% 96%  Weight:      Height:        Intake/Output Summary (Last 24 hours) at 03/11/2020 1216 Last data filed at 03/11/2020 0610 Gross per 24 hour  Intake 291.09 ml  Output 700 ml  Net -408.91 ml   Filed Weights   03/08/20 1134 03/08/20 2044  Weight: 101.2 kg 98.4 kg    Examination:  General exam: Appears calm and comfortable  Respiratory system: Clear to auscultation. Respiratory effort normal. Cardiovascular system: S1 & S2 heard, RRR. No JVD, murmurs, rubs, gallops or clicks. No pedal edema. Gastrointestinal system: Abdomen is nondistended, soft and nontender. No organomegaly or masses felt. Normal bowel sounds heard. Central nervous system: Alert and oriented.  0/5 power in bilateral lower extremities.  4/5 power in bilateral upper extremities.  Poor handgrip.  Sensation intact. Skin: No rashes, lesions or ulcers.  Psychiatry: Judgement and insight appear normal. Mood & affect appropriate.    Data Reviewed: I have personally reviewed following labs and imaging studies  CBC: Recent Labs  Lab 03/08/20 1142 03/10/20 0319 03/11/20 0222  WBC 7.2 8.3 9.1  NEUTROABS 3.8  --   --   HGB 13.8 13.1 13.4  HCT 42.2 40.1 40.4  MCV  89.0 89.3 88.0  PLT 173 174 500   Basic Metabolic Panel: Recent Labs  Lab 03/08/20 1142 03/10/20 0319 03/11/20 0222  NA 139 134* 133*  K 4.0 3.8 4.3  CL 103 102 100  CO2 24 21* 21*  GLUCOSE 229* 176* 189*  BUN 18 37* 60*  CREATININE 0.96 1.62* 1.74*  CALCIUM 9.5 9.3 8.9   GFR: Estimated Creatinine Clearance: 44.6 mL/min (A) (by C-G formula based on SCr of 1.74 mg/dL (H)). Liver Function Tests: Recent Labs  Lab 03/08/20 1142 03/10/20 0319 03/11/20 0222  AST 40 29 32  ALT 30 22 24   ALKPHOS 71 44 45  BILITOT 0.8 0.6 0.6  PROT 6.5 6.2* 6.3*  ALBUMIN 4.0 3.7 3.6   Recent Labs  Lab 03/08/20 1142  LIPASE 29   No results for input(s): AMMONIA in the last 168 hours. Coagulation Profile: No  results for input(s): INR, PROTIME in the last 168 hours. Cardiac Enzymes: No results for input(s): CKTOTAL, CKMB, CKMBINDEX, TROPONINI in the last 168 hours. BNP (last 3 results) No results for input(s): PROBNP in the last 8760 hours. HbA1C: Recent Labs    03/09/20 0404  HGBA1C 6.7*   CBG: Recent Labs  Lab 03/10/20 1139 03/10/20 1850 03/10/20 2104 03/11/20 0606 03/11/20 1120  GLUCAP 140* 77 153* 195* 232*   Lipid Profile: No results for input(s): CHOL, HDL, LDLCALC, TRIG, CHOLHDL, LDLDIRECT in the last 72 hours. Thyroid Function Tests: No results for input(s): TSH, T4TOTAL, FREET4, T3FREE, THYROIDAB in the last 72 hours. Anemia Panel: Recent Labs    03/09/20 1823  VITAMINB12 714  FOLATE 45.6   Sepsis Labs: No results for input(s): PROCALCITON, LATICACIDVEN in the last 168 hours.  Recent Results (from the past 240 hour(s))  SARS Coronavirus 2 by RT PCR (hospital order, performed in [facility] hospital lab) Nasopharyngeal Nasopharyngeal Swab     Status: None   Collection Time: 03/08/20 11:33 AM   Specimen: Nasopharyngeal Swab  Result Value Ref Range Status   SARS Coronavirus 2 NEGATIVE NEGATIVE Final    Comment: (NOTE) SARS-CoV-2 target nucleic acids are NOT  DETECTED.  The SARS-CoV-2 RNA is generally detectable in upper and lower respiratory specimens during the acute phase of infection. The lowest concentration of SARS-CoV-2 viral copies this assay can detect is 250 copies / mL. A negative result does not preclude SARS-CoV-2 infection and should not be used as the sole basis for treatment or other patient management decisions.  A negative result may occur with improper specimen collection / handling, submission of specimen other than nasopharyngeal swab, presence of viral mutation(s) within the areas targeted by this assay, and inadequate number of viral copies (<250 copies / mL). A negative result must be combined with clinical observations, patient history, and epidemiological information.  Fact Sheet for Patients:   StrictlyIdeas.no  Fact Sheet for Healthcare Providers: BankingDealers.co.za  This test is not yet approved or  cleared by the [place] FDA and has been authorized for detection and/or diagnosis of SARS-CoV-2 by FDA under an Emergency Use Authorization (EUA).  This EUA will remain in effect (meaning this test can be used) for the duration of the COVID-19 declaration under Section 564(b)(1) of the Act, 21 U.S.C. section 360bbb-3(b)(1), unless the authorization is terminated or revoked sooner.  Performed at [facility], [place] [address] [place], [place] [address]   Gram stain     Status: None   Collection Time: 03/10/20  5:15 PM   Specimen: PATH Cytology CSF; Cerebrospinal Fluid  Result Value Ref Range Status   Specimen Description CSF  Final   Special Requests NONE  Final   Gram Stain   Final    WBC PRESENT, PREDOMINANTLY PMN NO ORGANISMS SEEN CYTOSPIN SMEAR Performed at [facility], [place] [address], [place],  [address]    Report Status 03/10/2020 FINAL  Final     Radiology Studies: CT HEAD WO CONTRAST  Result Date: 03/10/2020 CLINICAL  DATA:  Periodic paralysis.  Pre lumbar puncture evaluation. EXAM: CT HEAD WITHOUT CONTRAST TECHNIQUE: Contiguous axial images were obtained from the base of the skull through the vertex without intravenous contrast. COMPARISON:  CT head 07/03/2015 FINDINGS: Brain: Ventricle size normal. Mild generalized atrophy. Negative for acute infarct or hemorrhage. Retro cerebellar arachnoid cyst measuring 2.7 x 5.8 cm unchanged from the prior study. Benign cysts in the basal ganglia bilaterally unchanged.  Vascular: Negative for hyperdense vessel Skull: No focal skeletal lesion. Sinuses/Orbits: Mucosal edema paranasal sinuses.  Negative orbit. Other: None IMPRESSION: No acute abnormality Retro cerebellar arachnoid cyst is stable. Electronically Signed   By: Franchot Gallo M.D.   On: 03/10/2020 16:22   MR CERVICAL SPINE WO CONTRAST  Addendum Date: 03/10/2020   ADDENDUM REPORT: 03/10/2020 19:12 ADDENDUM: Examination discussed with Dr. Macy Mis, who had previously discussed with Dr. Karena Addison Aroor. On further review, I agree there is abnormal T2-weighted signal intensity in the upper thoracic spinal cord. There is no spinal cord expansion. There are no abnormal flow voids along the surface of the spinal cord. This could indicate a subacute to chronic spinal cord infarction. Demyelinating disease or transverse myelitis are also possibilities. The patient is undergoing a contrast-enhanced cervical and thoracic spine MRI. Electronically Signed   By: Ulyses Jarred M.D.   On: 03/10/2020 19:12   Result Date: 03/10/2020 CLINICAL DATA:  Periodic paralysis EXAM: MRI CERVICAL, THORACIC AND LUMBAR SPINE WITHOUT CONTRAST TECHNIQUE: Multiplanar and multiecho pulse sequences of the cervical spine, to include the craniocervical junction and cervicothoracic junction, and thoracic and lumbar spine, were obtained without intravenous contrast. COMPARISON:  None. FINDINGS: MRI CERVICAL SPINE FINDINGS Alignment: Physiologic. Vertebrae: No  fracture, evidence of discitis, or bone lesion. Cord: Normal signal and morphology. Posterior Fossa, vertebral arteries, paraspinal tissues: Negative. Disc levels: C1-C2: Normal. C2-C3: Small central disc protrusion.  No stenosis. C3-C4: Uncovertebral hypertrophy and right asymmetric disc bulge. Moderate right foraminal stenosis. Moderate right facet hypertrophy. C4-C5: Mild right-greater-than-left facet hypertrophy. Minor disc bulge. Mild right foraminal stenosis. No spinal canal stenosis. C5-C6: Small disc bulge mild bilateral facet hypertrophy. Mild spinal canal stenosis. C6-C7: Small disc bulge with moderate spinal canal stenosis. C7-T1: Small central disc protrusion.  No spinal canal stenosis. MRI THORACIC SPINE FINDINGS Alignment:  Normal Vertebrae: No fracture, evidence of discitis, or bone lesion. Cord:  Normal Paraspinal and other soft tissues: There is a partially cystic lesion within the left renal lower pole with an internal fluid level. Disc levels: No large disc herniation or spinal canal stenosis. MRI LUMBAR SPINE FINDINGS Segmentation:  Standard Alignment:  Grade 1 anterolisthesis at L4-5 Vertebrae:  No fracture, evidence of discitis, or bone lesion. Conus medullaris and cauda equina: Conus extends to the L1 level. Conus and cauda equina appear normal. Paraspinal and other soft tissues: Negative Disc levels: T12-L1: Normal disc space and facets. No spinal canal or neuroforaminal stenosis. L1-L2: Normal disc space and facets. No spinal canal or neuroforaminal stenosis. L2-L3: Normal disc space and facets. No spinal canal or neuroforaminal stenosis. L3-L4: Minimal disc bulge.  Mild facet hypertrophy.  No stenosis. L4-L5: Moderate right asymmetric disc bulge with moderate bilateral facet hypertrophy. There is right lateral recess stenosis with mild central spinal canal stenosis. Moderate right foraminal stenosis. L5-S1: Severe facet hypertrophy with small central disc protrusion. No spinal canal  stenosis. No neural foraminal stenosis. Visualized sacrum: Normal. IMPRESSION: 1. No acute abnormality of the cervical, thoracic or lumbar spine. 2. Moderate C6-7 and mild C5-6 spinal canal stenosis. 3. Moderate right C3-4 neural foraminal stenosis. 4. Grade 1 anterolisthesis at L4-5 with mild spinal canal and right lateral recess stenosis and moderate right foraminal stenosis. Electronically Signed: By: Ulyses Jarred M.D. On: 03/09/2020 21:17   MR CERVICAL SPINE W CONTRAST  Result Date: 03/10/2020 CLINICAL DATA:  Myelopathy, acute or progressive. EXAM: MRI CERVICAL SPINE WITH CONTRAST TECHNIQUE: Multiplanar, multisequence MR imaging of the cervical spine was performed following the administration  of intravenous contrast. COMPARISON:  MRI cervical spine without contrast yesterday FINDINGS: Normal enhancement following contrast infusion. No enhancing vertebral lesion. No enhancing lesion in the cord. Paraspinous negative for soft tissue mass or abnormal enhancement. Multilevel degenerative change in the cervical spine as described on the report from yesterday. IMPRESSION: Normal enhancement of the cervical spine and cord. No mass lesion identified. Cervical spondylosis. Electronically Signed   By: Franchot Gallo M.D.   On: 03/10/2020 18:55   MR THORACIC SPINE WO CONTRAST  Addendum Date: 03/10/2020   ADDENDUM REPORT: 03/10/2020 19:12 ADDENDUM: Examination discussed with Dr. Macy Mis, who had previously discussed with Dr. Karena Addison Aroor. On further review, I agree there is abnormal T2-weighted signal intensity in the upper thoracic spinal cord. There is no spinal cord expansion. There are no abnormal flow voids along the surface of the spinal cord. This could indicate a subacute to chronic spinal cord infarction. Demyelinating disease or transverse myelitis are also possibilities. The patient is undergoing a contrast-enhanced cervical and thoracic spine MRI. Electronically Signed   By: Ulyses Jarred M.D.    On: 03/10/2020 19:12   Result Date: 03/10/2020 CLINICAL DATA:  Periodic paralysis EXAM: MRI CERVICAL, THORACIC AND LUMBAR SPINE WITHOUT CONTRAST TECHNIQUE: Multiplanar and multiecho pulse sequences of the cervical spine, to include the craniocervical junction and cervicothoracic junction, and thoracic and lumbar spine, were obtained without intravenous contrast. COMPARISON:  None. FINDINGS: MRI CERVICAL SPINE FINDINGS Alignment: Physiologic. Vertebrae: No fracture, evidence of discitis, or bone lesion. Cord: Normal signal and morphology. Posterior Fossa, vertebral arteries, paraspinal tissues: Negative. Disc levels: C1-C2: Normal. C2-C3: Small central disc protrusion.  No stenosis. C3-C4: Uncovertebral hypertrophy and right asymmetric disc bulge. Moderate right foraminal stenosis. Moderate right facet hypertrophy. C4-C5: Mild right-greater-than-left facet hypertrophy. Minor disc bulge. Mild right foraminal stenosis. No spinal canal stenosis. C5-C6: Small disc bulge mild bilateral facet hypertrophy. Mild spinal canal stenosis. C6-C7: Small disc bulge with moderate spinal canal stenosis. C7-T1: Small central disc protrusion.  No spinal canal stenosis. MRI THORACIC SPINE FINDINGS Alignment:  Normal Vertebrae: No fracture, evidence of discitis, or bone lesion. Cord:  Normal Paraspinal and other soft tissues: There is a partially cystic lesion within the left renal lower pole with an internal fluid level. Disc levels: No large disc herniation or spinal canal stenosis. MRI LUMBAR SPINE FINDINGS Segmentation:  Standard Alignment:  Grade 1 anterolisthesis at L4-5 Vertebrae:  No fracture, evidence of discitis, or bone lesion. Conus medullaris and cauda equina: Conus extends to the L1 level. Conus and cauda equina appear normal. Paraspinal and other soft tissues: Negative Disc levels: T12-L1: Normal disc space and facets. No spinal canal or neuroforaminal stenosis. L1-L2: Normal disc space and facets. No spinal canal or  neuroforaminal stenosis. L2-L3: Normal disc space and facets. No spinal canal or neuroforaminal stenosis. L3-L4: Minimal disc bulge.  Mild facet hypertrophy.  No stenosis. L4-L5: Moderate right asymmetric disc bulge with moderate bilateral facet hypertrophy. There is right lateral recess stenosis with mild central spinal canal stenosis. Moderate right foraminal stenosis. L5-S1: Severe facet hypertrophy with small central disc protrusion. No spinal canal stenosis. No neural foraminal stenosis. Visualized sacrum: Normal. IMPRESSION: 1. No acute abnormality of the cervical, thoracic or lumbar spine. 2. Moderate C6-7 and mild C5-6 spinal canal stenosis. 3. Moderate right C3-4 neural foraminal stenosis. 4. Grade 1 anterolisthesis at L4-5 with mild spinal canal and right lateral recess stenosis and moderate right foraminal stenosis. Electronically Signed: By: Ulyses Jarred M.D. On: 03/09/2020 21:17   MR  THORACIC SPINE W CONTRAST  Result Date: 03/10/2020 CLINICAL DATA:  Myelopathy, acute or progressive. EXAM: MRI THORACIC SPINE WITH CONTRAST TECHNIQUE: Multiplanar, multisequence MR imaging of the thoracic spine was performed following the administration of intravenous contrast. COMPARISON:  MRI thoracic spine without contrast yesterday CONTRAST:  9.8 mL Gadovist IV FINDINGS: 6 x 10 mm enhancing lesion in the T9 vertebral body on the left located posteriorly. This is low signal on T1 and high signal on T2 on yesterday's study and shows fairly homogeneous enhancement. This lesion is not visible on recent chest CT of 03/08/2020. No fracture or extraosseous extension. No other bone marrow lesions identified. Normal enhancement of the spinal cord.  No cord mass identified. Paraspinous soft tissues enhance normally. IMPRESSION: 6 x 10 mm enhancing lesion left T9 vertebral body. This appearance is nonspecific and could be seen with benign lesion such as atypical hemangioma versus neoplasm such as metastatic disease. The  patient has history of melanoma. There is no cord compression or cause for myelopathy identified. Electronically Signed   By: Franchot Gallo M.D.   On: 03/10/2020 19:06   MR LUMBAR SPINE WO CONTRAST  Addendum Date: 03/10/2020   ADDENDUM REPORT: 03/10/2020 19:12 ADDENDUM: Examination discussed with Dr. Macy Mis, who had previously discussed with Dr. Karena Addison Aroor. On further review, I agree there is abnormal T2-weighted signal intensity in the upper thoracic spinal cord. There is no spinal cord expansion. There are no abnormal flow voids along the surface of the spinal cord. This could indicate a subacute to chronic spinal cord infarction. Demyelinating disease or transverse myelitis are also possibilities. The patient is undergoing a contrast-enhanced cervical and thoracic spine MRI. Electronically Signed   By: Ulyses Jarred M.D.   On: 03/10/2020 19:12   Result Date: 03/10/2020 CLINICAL DATA:  Periodic paralysis EXAM: MRI CERVICAL, THORACIC AND LUMBAR SPINE WITHOUT CONTRAST TECHNIQUE: Multiplanar and multiecho pulse sequences of the cervical spine, to include the craniocervical junction and cervicothoracic junction, and thoracic and lumbar spine, were obtained without intravenous contrast. COMPARISON:  None. FINDINGS: MRI CERVICAL SPINE FINDINGS Alignment: Physiologic. Vertebrae: No fracture, evidence of discitis, or bone lesion. Cord: Normal signal and morphology. Posterior Fossa, vertebral arteries, paraspinal tissues: Negative. Disc levels: C1-C2: Normal. C2-C3: Small central disc protrusion.  No stenosis. C3-C4: Uncovertebral hypertrophy and right asymmetric disc bulge. Moderate right foraminal stenosis. Moderate right facet hypertrophy. C4-C5: Mild right-greater-than-left facet hypertrophy. Minor disc bulge. Mild right foraminal stenosis. No spinal canal stenosis. C5-C6: Small disc bulge mild bilateral facet hypertrophy. Mild spinal canal stenosis. C6-C7: Small disc bulge with moderate spinal canal  stenosis. C7-T1: Small central disc protrusion.  No spinal canal stenosis. MRI THORACIC SPINE FINDINGS Alignment:  Normal Vertebrae: No fracture, evidence of discitis, or bone lesion. Cord:  Normal Paraspinal and other soft tissues: There is a partially cystic lesion within the left renal lower pole with an internal fluid level. Disc levels: No large disc herniation or spinal canal stenosis. MRI LUMBAR SPINE FINDINGS Segmentation:  Standard Alignment:  Grade 1 anterolisthesis at L4-5 Vertebrae:  No fracture, evidence of discitis, or bone lesion. Conus medullaris and cauda equina: Conus extends to the L1 level. Conus and cauda equina appear normal. Paraspinal and other soft tissues: Negative Disc levels: T12-L1: Normal disc space and facets. No spinal canal or neuroforaminal stenosis. L1-L2: Normal disc space and facets. No spinal canal or neuroforaminal stenosis. L2-L3: Normal disc space and facets. No spinal canal or neuroforaminal stenosis. L3-L4: Minimal disc bulge.  Mild facet hypertrophy.  No  stenosis. L4-L5: Moderate right asymmetric disc bulge with moderate bilateral facet hypertrophy. There is right lateral recess stenosis with mild central spinal canal stenosis. Moderate right foraminal stenosis. L5-S1: Severe facet hypertrophy with small central disc protrusion. No spinal canal stenosis. No neural foraminal stenosis. Visualized sacrum: Normal. IMPRESSION: 1. No acute abnormality of the cervical, thoracic or lumbar spine. 2. Moderate C6-7 and mild C5-6 spinal canal stenosis. 3. Moderate right C3-4 neural foraminal stenosis. 4. Grade 1 anterolisthesis at L4-5 with mild spinal canal and right lateral recess stenosis and moderate right foraminal stenosis. Electronically Signed: By: Ulyses Jarred M.D. On: 03/09/2020 21:17   NM Myocar Multi W/Spect W/Wall Motion / EF  Result Date: 03/09/2020 CLINICAL DATA:  Recent onset of chest pain. History diabetes, remote myocardial infarction and coronary stenting.  EXAM: MYOCARDIAL IMAGING WITH SPECT (REST AND PHARMACOLOGIC-STRESS) GATED LEFT VENTRICULAR WALL MOTION STUDY LEFT VENTRICULAR EJECTION FRACTION TECHNIQUE: Standard myocardial SPECT imaging was performed after resting intravenous injection of 11 mCi Tc-54m tetrofosmin. Subsequently, intravenous infusion of Lexiscan was performed under the supervision of the Cardiology staff. At peak effect of the drug, 29 mCi Tc-58m tetrofosmin was injected intravenously and standard myocardial SPECT imaging was performed. Quantitative gated imaging was also performed to evaluate left ventricular wall motion, and estimate left ventricular ejection fraction. COMPARISON:  Images from nuclear medicine myocardial perfusion study 09/25/2013 without report FINDINGS: Perfusion: No decreased activity in the left ventricle on stress imaging to suggest reversible ischemia or infarction. Mildly prominent subdiaphragmatic activity on the rest images. Wall Motion: Normal left ventricular wall motion. No left ventricular dilation. Left Ventricular Ejection Fraction: 53 % End diastolic volume 91 ml End systolic volume 43 ml IMPRESSION: 1. No reversible ischemia or infarction. 2. Normal left ventricular wall motion. 3. Left ventricular ejection fraction 53% 4. Non invasive risk stratification*: Low *2012 Appropriate Use Criteria for Coronary Revascularization Focused Update: J Am Coll Cardiol. 8756;43(3):295-188. http://content.airportbarriers.com.aspx?articleid=[email or web address] Electronically Signed   By: Richardean Sale M.D.   On: 03/09/2020 16:43   DG FL GUIDED LUMBAR PUNCTURE  Result Date: 03/10/2020 CLINICAL DATA:  Paraplegia. EXAM: DIAGNOSTIC LUMBAR PUNCTURE UNDER FLUOROSCOPIC GUIDANCE FLUOROSCOPY TIME:  Fluoroscopy Time:  2 minutes, 36 seconds Radiation Exposure Index (if provided by the fluoroscopic device): 73.0 mGy Number of Acquired Spot Images: 5 PROCEDURE: Informed consent was obtained from the patient prior to the procedure, including  potential complications of headache, allergy, and pain. With the patient prone, the lower back was prepped with Betadine. 1% Lidocaine was used for local anesthesia. Lumbar puncture was performed at the L2-L3 level using a 20 gauge needle with return of clear CSF. 12 ml of CSF were obtained for laboratory studies. The patient tolerated the procedure well and there were no immediate postprocedure complications. IMPRESSION: Technically successful fluoroscopically guided L2-L3 lumbar puncture. No immediate postprocedure complication. 12 mL of CSF were obtained for laboratory studies. Electronically Signed   By: Kellie Simmering DO   On: 03/10/2020 17:18    Scheduled Meds:  aspirin EC  81 mg Oral QHS   fenofibrate  160 mg Oral QHS   fluticasone  2 spray Each Nare QHS   icosapent Ethyl  2 g Oral BID   insulin aspart  0-5 Units Subcutaneous QHS   insulin aspart  0-9 Units Subcutaneous TID WC   insulin aspart  25 Units Subcutaneous TID AC   insulin glargine  10 Units Subcutaneous Daily   insulin glargine  60 Units Subcutaneous QHS   lidocaine  1 patch Transdermal Q24H  multivitamin with minerals  1 tablet Oral Daily   nitroGLYCERIN  1 inch Topical Q6H   oxybutynin  5 mg Oral BID   pantoprazole  40 mg Oral Daily   rosuvastatin  40 mg Oral QHS   sertraline  150 mg Oral QHS   vitamin B-12  500 mcg Oral Daily   zolpidem  10 mg Oral QHS   Continuous Infusions:  sodium chloride Stopped (03/10/20 2249)   sodium chloride 125 mL/hr at 03/11/20 0902   methylPREDNISolone (SOLU-MEDROL) injection 1,000 mg (03/11/20 0905)     LOS: 2 days   Darliss Cheney, MD [facility] Pager On Amion  If 7PM-7AM, please contact night-coverage 03/11/2020, 12:16 PM

## 2020-03-11 NOTE — Progress Notes (Signed)
Pt able to perform NIF at -40 and VC of 1.8L with good effort.

## 2020-03-11 NOTE — Progress Notes (Addendum)
BG=[id number]. Page MD and Waiting for MD called back. Given pt total 34 units of novolog per instruction in the [facility]. Pt asymptomatic, VSS. Communicated with the night shift nurse to follow up with BG.   Lavenia Atlas, RN

## 2020-03-11 NOTE — Progress Notes (Signed)
NIF: > -40 VC: 1.9L with good pt effort

## 2020-03-11 NOTE — Progress Notes (Signed)
Inpatient Rehabilitation-Admissions Coordinator   Met with pt and his wife bedside as follow up from PM&R MD consult (please see consult completed by Dr. Delice Lesch from 6/23 for details). Discussed current recommended rehab program, including expectations, anticipated LOS, and expected functional outcomes. We also discussed that this program will only be recommended if his workup reveals a diagnosis that is amenable to rehab. Pt and his wife had many questions about the workup and AC continued to refer them back to MD for further discussion. Pt is very interested in this program if he qualifies. Confirmed Min A support at DC from his wife. AC will plan to follow along while workup continuing to see if this is the most appropriate post acute venue.   *Wife reports he uses CPAP at home but has not been on it since being in the hospital. [facility] notified RN of this information.   Raechel Ache, OTR/L  Rehab Admissions Coordinator  [phone] 03/11/2020 3:37 PM

## 2020-03-11 NOTE — Evaluation (Signed)
Clinical/Bedside Swallow Evaluation Patient Details  Name: Brent [name]. MRN: [id number] Date of Birth: 19-Apr-1946  Today's Date: 03/11/2020 Time: SLP Start Time (ACUTE ONLY): 1525 SLP Stop Time (ACUTE ONLY): 1542 SLP Time Calculation (min) (ACUTE ONLY): 17 min  Past Medical History:  Past Medical History:  Diagnosis Date   Allergy    CAD (coronary artery disease)    a. angioplasty of his RCA in 1990. b. bare metal stent placed in the RCA in 2000 followed by rotational atherectomy shortly after for stent restenosis. c. last cath was in 2012 showing stable moderate diffuse CAD. (70% mid LAD, 80% diagonal, 70% Ramus, 40% mid to distal RCA stent restenosis). d. Low risk nuc in 2015.   Diabetes mellitus    Diverticulosis    Elevated CK    Erectile dysfunction    Hemorrhoids    HTN (hypertension)    Hyperlipidemia    Hypertriglyceridemia    Malignant melanoma of left side of neck ([place]) 10/25/2018   Myocardial infarction (HCC)    Obesity    OSA (obstructive sleep apnea)    Persistent disorder of initiating or maintaining sleep    Personal history of colonic polyps 02/05/2003   Past Surgical History:  Past Surgical History:  Procedure Laterality Date   CHOLECYSTECTOMY     CORONARY ANGIOPLASTY     CORONARY STENT PLACEMENT     stenting of the right coronary artery with followup rotational  atherectomy. (3 stents placed)   FINGER SURGERY     right   FOOT SURGERY     right   INGUINAL HERNIA REPAIR     right   melanoma removal     neck   ORTHOPEDIC SURGERY     foot right   rotator cuff surg     Bil   HPI:  Pt is a 74 y.o. male with medical history significant of remote MI stenting x3 in 1990s, CAD, IDDM, DM neuropathy, sciatica with injection treatment 2 years ago, HTN, HLD, came with new onset of chest pain and developed RLE weakness in ED.  CXR 6/20 and Ct head 6/22 negative for acute changes.  Pt reported that his stomach is numb and that for the  past few days he has not had the strength to cough with strength.    Assessment / Plan / Recommendation Clinical Impression  Pt was seen for bedside swallow evaluation with his wife present. Pt reported that his cough has been weak and that he has had difficulty expectorating mucous due to this but he denied any symptoms of oropharyngeal dysphagia. Pt's nurse reported that he has been consuming the current diet and pills with thin liquids without difficulty. Oral mechanism exam was [facility] and dentition adequate. He tolerated all solids and liquids without signs or symptoms of oropharyngeal dysphagia. A regular texture diet with thin liquids is recommended at this time and further skilled SLP services are not clinically indicated for swallowing.  SLP Visit Diagnosis: Dysphagia, unspecified (R13.10)    Aspiration Risk  No limitations    Diet Recommendation Regular;Thin liquid   Liquid Administration via: Cup;Straw Medication Administration: Whole meds with liquid    Other  Recommendations     Follow up Recommendations None      Frequency and Duration            Prognosis        Swallow Study   General Date of Onset: 03/10/20 HPI: Pt is a 74 y.o. male with medical history significant of  remote MI stenting x3 in 1990s, CAD, IDDM, DM neuropathy, sciatica with injection treatment 2 years ago, HTN, HLD, came with new onset of chest pain and developed RLE weakness in ED.  CXR 6/20 and Ct head 6/22 negative for acute changes.  Pt reported that his stomach is numb and that for the past few days he has not had the strength to cough with strength.  Type of Study: Bedside Swallow Evaluation Previous Swallow Assessment: None Diet Prior to this Study: Regular;Thin liquids Temperature Spikes Noted: No Respiratory Status: Room air History of Recent Intubation: No Behavior/Cognition: Alert;Cooperative;Pleasant mood Oral Cavity Assessment: Within Functional Limits Oral Care Completed by SLP:  No Oral Cavity - Dentition: Adequate natural dentition Vision: Functional for self-feeding Self-Feeding Abilities: Able to feed self Patient Positioning: Upright in bed;Postural control adequate for testing Baseline Vocal Quality: Normal Volitional Cough: Weak Volitional Swallow: Able to elicit    Oral/Motor/Sensory Function Overall Oral Motor/Sensory Function: Within functional limits   Ice Chips Ice chips: Within functional limits Presentation: Cup   Thin Liquid Thin Liquid: Within functional limits Presentation: Cup;Straw    Nectar Thick Nectar Thick Liquid: Not tested   Honey Thick Honey Thick Liquid: Not tested   Puree Puree: Within functional limits Presentation: Spoon   Solid     Solid: Within functional limits Presentation: [place] [place], [place], [place] Office number [phone] Pager [phone]  Horton Marshall 03/11/2020,5:16 PM

## 2020-03-11 NOTE — Consult Note (Signed)
Physical Medicine and Rehabilitation Consult Reason for Consult: Bilateral upper and lower extremity weakness Referring Physician: [facility]   HPI: Brent [name]. is a 74 y.o. right-handed male with history of remote MI stenting x3 in 1990s maintained on aspirin,, diabetes mellitus with neuropathy, hyperlipidemia, obesity with BMI 29.42.  Per chart review lives with spouse and 16 year old grandson.  History taken from chart review, patient, and wife.  Reportedly independent prior to admission.  Two-level home half bath on main level bedroom upstairs.  He presented on 03/08/20 with chest pain radiating to his back associated shortness of breath and diaphoresis as well as reported some right leg weakness then quickly had bilateral lower extremity weakness as well as numbness in both hands..  Troponin unremarkable and chemistries within normal limits except glucose 229.  Chest x-ray no acute process.  CT angiogram of chest negative for aortic aneurysm or dissection.  No acute findings in the chest abdomen or pelvis.  MRI thoracic spine showed a 6 x 10 mm enhancing lesion left T9 vertebral body appearance nonspecific.  No cord compression or cause for myelopathy identified.  MRI cervical spine normal enhancement no mass lesion.  Cranial CT scan negative. Neurology follow-up lumbar puncture completed for work-up current results pending.  Placed on IV Solu-Medrol 1 g x 5 days question transverse myelitis.  Cardiology follow-up for issues in regards to chest pain Lexiscan Myoview study pending.  Therapy evaluation completed with recommendations of physical medicine rehab consult.  Review of Systems  Constitutional: Negative for chills and fever.  HENT: Negative for hearing loss.   Eyes: Negative for blurred vision and double vision.  Respiratory: Negative for cough.   Gastrointestinal: Positive for constipation. Negative for heartburn, nausea and vomiting.  Genitourinary: Negative for dysuria, flank  pain and hematuria.  Musculoskeletal: Positive for joint pain and myalgias.  Skin: Negative for rash.  Neurological: Positive for sensory change, focal weakness and weakness.  Psychiatric/Behavioral: Positive for depression.  All other systems reviewed and are negative.  Past Medical History:  Diagnosis Date  . Allergy   . CAD (coronary artery disease)    a. angioplasty of his RCA in 1990. b. bare metal stent placed in the RCA in 2000 followed by rotational atherectomy shortly after for stent restenosis. c. last cath was in 2012 showing stable moderate diffuse CAD. (70% mid LAD, 80% diagonal, 70% Ramus, 40% mid to distal RCA stent restenosis). d. Low risk nuc in 2015.  . Diabetes mellitus   . Diverticulosis   . Elevated CK   . Erectile dysfunction   . Hemorrhoids   . HTN (hypertension)   . Hyperlipidemia   . Hypertriglyceridemia   . Malignant melanoma of left side of neck ([place]) 10/25/2018  . Myocardial infarction ([place])   . Obesity   . OSA (obstructive sleep apnea)   . Persistent disorder of initiating or maintaining sleep   . Personal history of colonic polyps 02/05/2003   Past Surgical History:  Procedure Laterality Date  . CHOLECYSTECTOMY    . CORONARY ANGIOPLASTY    . CORONARY STENT PLACEMENT     stenting of the right coronary artery with followup rotational  atherectomy. (3 stents placed)  . FINGER SURGERY     right  . FOOT SURGERY     right  . INGUINAL HERNIA REPAIR     right  . melanoma removal     neck  . ORTHOPEDIC SURGERY     foot right  . rotator cuff  surg     Bil   Family History  Problem Relation Age of Onset  . Liver cancer Father   . Lung cancer Father   . Heart disease Father   . Heart disease Sister   . Lung cancer Mother   . COPD Mother   . Obesity Mother   . Colon cancer Neg Hx   . Stomach cancer Neg Hx   . Pancreatic cancer Neg Hx    Social History:  reports that he quit smoking about 39 years ago. His smoking use included cigarettes. He has  a 45.00 pack-year smoking history. He has never used smokeless tobacco. He reports current alcohol use of about 7.0 - 14.0 standard drinks of alcohol per week. He reports that he does not use drugs. Allergies:  Allergies  Allergen Reactions  . Penicillins Hives and Rash   Medications Prior to Admission  Medication Sig Dispense Refill  . aspirin EC 81 MG tablet Take 81 mg by mouth at bedtime. Swallow whole.    . empagliflozin (JARDIANCE) 25 MG TABS tablet Take 12.5 mg by mouth daily.    . fenofibrate 160 MG tablet Take 160 mg by mouth at bedtime.     . fluticasone (FLONASE) 50 MCG/ACT nasal spray SPRAY 2 SPRAYS INTO EACH NOSTRIL EVERY DAY (Patient taking differently: Place 2 sprays into both nostrils at bedtime. ) 48 mL 3  . ibuprofen (ADVIL) 200 MG tablet Take 600 mg by mouth daily as needed for headache (pain).    [name] Ethyl (VASCEPA) 1 g CAPS Take 2 capsules (2 g total) by mouth 2 (two) times daily. 120 capsule 3  . insulin aspart (NOVOLOG FLEXPEN) 100 UNIT/ML FlexPen Inject 25-30 Units into the skin 3 (three) times daily before meals.     . insulin glargine (LANTUS) 100 unit/mL SOPN Inject 60 Units into the skin at bedtime.     Marland Kitchen lisinopril (ZESTRIL) 10 MG tablet Take 10 mg by mouth daily. For kidney protection    . LORazepam (ATIVAN) 1 MG tablet TAKE 1 TABLET BY MOUTH TWICE A DAY AS NEEDED FOR ANXIETY (Patient taking differently: Take 1 mg by mouth See admin instructions. Take one tablet (1 mg) by mouth daily at bedtime, may also take one tablet (1 mg) during the day as needed for anxiety) 180 tablet 0  . metFORMIN (GLUCOPHAGE) 1000 MG tablet Take 1,000 mg by mouth 2 (two) times daily with a meal.    . Multiple Vitamin (MULTIVITAMIN WITH MINERALS) TABS tablet Take 1 tablet by mouth daily. Centrum Silver Men's    . oxybutynin (DITROPAN) 5 MG tablet Take 5 mg by mouth 2 (two) times daily.     . rosuvastatin (CRESTOR) 40 MG tablet Take 40 mg by mouth at bedtime.     . sertraline  (ZOLOFT) 100 MG tablet Take 150 mg by mouth at bedtime.     . vitamin B-12 (CYANOCOBALAMIN) 500 MCG tablet Take 500 mcg by mouth daily.    Marland Kitchen zolpidem (AMBIEN) 10 MG tablet Take 10 mg by mouth at bedtime. For insomnia     . benzonatate (TESSALON) 100 MG capsule Take 1 capsule (100 mg total) by mouth 2 (two) times daily as needed for cough. (Patient not taking: Reported on 03/06/2020) 20 capsule 0  . lisinopril (ZESTRIL) 20 MG tablet Take 1 tablet (20 mg total) by mouth daily. 90 tablet 1  . triamcinolone ointment (KENALOG) 0.1 % Apply 1 application topically 2 (two) times daily. (Patient not taking: Reported on  03/08/2020) 80 g 1    Home: Home Living Family/patient expects to be discharged to:: Private residence Living Arrangements: Spouse/significant other, Other relatives (48 year old grandson) Available Help at Discharge: Family, Available 24 hours/day Type of Home: House Home Access: Stairs to enter [employer] of Steps: 2 Entrance Stairs-Rails: None Home Layout: Two level, 1/2 bath on main level, Bed/bath upstairs Alternate Level Stairs-Number of Steps: flight Bathroom Shower/Tub: Chiropodist: Standard Home Equipment: None  Functional History: Prior Function Level of Independence: Independent Functional Status:  Mobility: Bed Mobility General bed mobility comments: Not attempted with 1 person due to profound weakness. Assume +2 total assist Transfers General transfer comment: Not attempted      ADL:    Cognition: Cognition Overall Cognitive Status: Within Functional Limits for tasks assessed Orientation Level: Oriented X4 Cognition Arousal/Alertness: Awake/alert Behavior During Therapy: WFL for tasks assessed/performed Overall Cognitive Status: Within Functional Limits for tasks assessed  Blood pressure (!) 114/58, pulse 60, temperature 98.2 F (36.8 C), temperature source Oral, resp. rate 14, height 6' (1.829 m), weight 98.4 kg, SpO2 94  %. Physical Exam  Vitals reviewed. Constitutional: No distress.  HENT:  Head: Normocephalic and atraumatic.  Respiratory: Effort normal. No stridor.  GI: Soft.  Musculoskeletal:     Cervical back: Normal range of motion.     Comments: No edema or tenderness in extremities  Neurological: He is alert.  Patient is alert in no acute distress.   Oriented x3 and follows commands. Motor: Bilateral upper extremities: Shoulder abduction, elbow flexion/extension 5/5, wrist extension 4+/5, distally 0/5 Lateral lower extremities: 0/5 proximal distal Sensation diminished to light touch distal to bilateral hands  Skin: Skin is warm and dry.  Psychiatric: His behavior is normal. Mood normal.    Results for orders placed or performed during the hospital encounter of 03/08/20 (from the past 24 hour(s))  Glucose, capillary     Status: Abnormal   Collection Time: 03/10/20  6:33 AM  Result Value Ref Range   Glucose-Capillary 146 (H) 70 - 99 mg/dL  Glucose, capillary     Status: Abnormal   Collection Time: 03/10/20 11:39 AM  Result Value Ref Range   Glucose-Capillary 140 (H) 70 - 99 mg/dL  Glucose, CSF     Status: Abnormal   Collection Time: 03/10/20  5:15 PM  Result Value Ref Range   Glucose, CSF 77 (H) 40 - 70 mg/dL  Protein, CSF     Status: Abnormal   Collection Time: 03/10/20  5:15 PM  Result Value Ref Range   Total  Protein, CSF 53 (H) 15 - 45 mg/dL  CSF cell count with differential     Status: Abnormal   Collection Time: 03/10/20  5:15 PM  Result Value Ref Range   Tube # 1    Color, CSF COLORLESS COLORLESS   Appearance, CSF CLEAR CLEAR   Supernatant NOT INDICATED    RBC Count, CSF 13 (H) 0 /cu mm   WBC, CSF 1 0 - 5 /cu mm   Other Cells, CSF TOO FEW TO COUNT, SMEAR AVAILABLE FOR REVIEW   Gram stain     Status: None   Collection Time: 03/10/20  5:15 PM   Specimen: PATH Cytology CSF; Cerebrospinal Fluid  Result Value Ref Range   Specimen Description CSF    Special Requests NONE     Gram Stain      WBC PRESENT, PREDOMINANTLY PMN NO ORGANISMS SEEN CYTOSPIN SMEAR Performed at [facility], [address], [place],  [address]    Report Status 03/10/2020 FINAL   Glucose, capillary     Status: None   Collection Time: 03/10/20  6:50 PM  Result Value Ref Range   Glucose-Capillary 77 70 - 99 mg/dL  Glucose, capillary     Status: Abnormal   Collection Time: 03/10/20  9:04 PM  Result Value Ref Range   Glucose-Capillary 153 (H) 70 - 99 mg/dL  Comprehensive metabolic panel     Status: Abnormal   Collection Time: 03/11/20  2:22 AM  Result Value Ref Range   Sodium 133 (L) 135 - 145 mmol/L   Potassium 4.3 3.5 - 5.1 mmol/L   Chloride 100 98 - 111 mmol/L   CO2 21 (L) 22 - 32 mmol/L   Glucose, Bld 189 (H) 70 - 99 mg/dL   BUN 60 (H) 8 - 23 mg/dL   Creatinine, Ser 1.74 (H) 0.61 - 1.24 mg/dL   Calcium 8.9 8.9 - 10.3 mg/dL   Total Protein 6.3 (L) 6.5 - 8.1 g/dL   Albumin 3.6 3.5 - 5.0 g/dL   AST 32 15 - 41 U/L   ALT 24 0 - 44 U/L   Alkaline Phosphatase 45 38 - 126 U/L   Total Bilirubin 0.6 0.3 - 1.2 mg/dL   GFR calc non Af Amer 38 (L) >60 mL/min   GFR calc Af Amer 43 (L) >60 mL/min   Anion gap 12 5 - 15  CBC     Status: None   Collection Time: 03/11/20  2:22 AM  Result Value Ref Range   WBC 9.1 4.0 - 10.5 K/uL   RBC 4.59 4.22 - 5.81 MIL/uL   Hemoglobin 13.4 13.0 - 17.0 g/dL   HCT 40.4 39 - 52 %   MCV 88.0 80.0 - 100.0 fL   MCH 29.2 26.0 - 34.0 pg   MCHC 33.2 30.0 - 36.0 g/dL   RDW 13.2 11.5 - 15.5 %   Platelets 183 150 - 400 K/uL   nRBC 0.0 0.0 - 0.2 %   CT HEAD WO CONTRAST  Result Date: 03/10/2020 CLINICAL DATA:  Periodic paralysis.  Pre lumbar puncture evaluation. EXAM: CT HEAD WITHOUT CONTRAST TECHNIQUE: Contiguous axial images were obtained from the base of the skull through the vertex without intravenous contrast. COMPARISON:  CT head 07/03/2015 FINDINGS: Brain: Ventricle size normal. Mild generalized atrophy. Negative for acute infarct or  hemorrhage. Retro cerebellar arachnoid cyst measuring 2.7 x 5.8 cm unchanged from the prior study. Benign cysts in the basal ganglia bilaterally unchanged. Vascular: Negative for hyperdense vessel Skull: No focal skeletal lesion. Sinuses/Orbits: Mucosal edema paranasal sinuses.  Negative orbit. Other: None IMPRESSION: No acute abnormality Retro cerebellar arachnoid cyst is stable. Electronically Signed   By: Franchot Gallo M.D.   On: 03/10/2020 16:22   MR CERVICAL SPINE WO CONTRAST  Addendum Date: 03/10/2020   ADDENDUM REPORT: 03/10/2020 19:12 ADDENDUM: Examination discussed with Dr. Macy Mis, who had previously discussed with Dr. Karena Addison Aroor. On further review, I agree there is abnormal T2-weighted signal intensity in the upper thoracic spinal cord. There is no spinal cord expansion. There are no abnormal flow voids along the surface of the spinal cord. This could indicate a subacute to chronic spinal cord infarction. Demyelinating disease or transverse myelitis are also possibilities. The patient is undergoing a contrast-enhanced cervical and thoracic spine MRI. Electronically Signed   By: Ulyses Jarred M.D.   On: 03/10/2020 19:12   Result Date: 03/10/2020 CLINICAL DATA:  Periodic paralysis  EXAM: MRI CERVICAL, THORACIC AND LUMBAR SPINE WITHOUT CONTRAST TECHNIQUE: Multiplanar and multiecho pulse sequences of the cervical spine, to include the craniocervical junction and cervicothoracic junction, and thoracic and lumbar spine, were obtained without intravenous contrast. COMPARISON:  None. FINDINGS: MRI CERVICAL SPINE FINDINGS Alignment: Physiologic. Vertebrae: No fracture, evidence of discitis, or bone lesion. Cord: Normal signal and morphology. Posterior Fossa, vertebral arteries, paraspinal tissues: Negative. Disc levels: C1-C2: Normal. C2-C3: Small central disc protrusion.  No stenosis. C3-C4: Uncovertebral hypertrophy and right asymmetric disc bulge. Moderate right foraminal stenosis. Moderate  right facet hypertrophy. C4-C5: Mild right-greater-than-left facet hypertrophy. Minor disc bulge. Mild right foraminal stenosis. No spinal canal stenosis. C5-C6: Small disc bulge mild bilateral facet hypertrophy. Mild spinal canal stenosis. C6-C7: Small disc bulge with moderate spinal canal stenosis. C7-T1: Small central disc protrusion.  No spinal canal stenosis. MRI THORACIC SPINE FINDINGS Alignment:  Normal Vertebrae: No fracture, evidence of discitis, or bone lesion. Cord:  Normal Paraspinal and other soft tissues: There is a partially cystic lesion within the left renal lower pole with an internal fluid level. Disc levels: No large disc herniation or spinal canal stenosis. MRI LUMBAR SPINE FINDINGS Segmentation:  Standard Alignment:  Grade 1 anterolisthesis at L4-5 Vertebrae:  No fracture, evidence of discitis, or bone lesion. Conus medullaris and cauda equina: Conus extends to the L1 level. Conus and cauda equina appear normal. Paraspinal and other soft tissues: Negative Disc levels: T12-L1: Normal disc space and facets. No spinal canal or neuroforaminal stenosis. L1-L2: Normal disc space and facets. No spinal canal or neuroforaminal stenosis. L2-L3: Normal disc space and facets. No spinal canal or neuroforaminal stenosis. L3-L4: Minimal disc bulge.  Mild facet hypertrophy.  No stenosis. L4-L5: Moderate right asymmetric disc bulge with moderate bilateral facet hypertrophy. There is right lateral recess stenosis with mild central spinal canal stenosis. Moderate right foraminal stenosis. L5-S1: Severe facet hypertrophy with small central disc protrusion. No spinal canal stenosis. No neural foraminal stenosis. Visualized sacrum: Normal. IMPRESSION: 1. No acute abnormality of the cervical, thoracic or lumbar spine. 2. Moderate C6-7 and mild C5-6 spinal canal stenosis. 3. Moderate right C3-4 neural foraminal stenosis. 4. Grade 1 anterolisthesis at L4-5 with mild spinal canal and right lateral recess stenosis and  moderate right foraminal stenosis. Electronically Signed: By: Ulyses Jarred M.D. On: 03/09/2020 21:17   MR CERVICAL SPINE W CONTRAST  Result Date: 03/10/2020 CLINICAL DATA:  Myelopathy, acute or progressive. EXAM: MRI CERVICAL SPINE WITH CONTRAST TECHNIQUE: Multiplanar, multisequence MR imaging of the cervical spine was performed following the administration of intravenous contrast. COMPARISON:  MRI cervical spine without contrast yesterday FINDINGS: Normal enhancement following contrast infusion. No enhancing vertebral lesion. No enhancing lesion in the cord. Paraspinous negative for soft tissue mass or abnormal enhancement. Multilevel degenerative change in the cervical spine as described on the report from yesterday. IMPRESSION: Normal enhancement of the cervical spine and cord. No mass lesion identified. Cervical spondylosis. Electronically Signed   By: Franchot Gallo M.D.   On: 03/10/2020 18:55   MR THORACIC SPINE WO CONTRAST  Addendum Date: 03/10/2020   ADDENDUM REPORT: 03/10/2020 19:12 ADDENDUM: Examination discussed with Dr. Macy Mis, who had previously discussed with Dr. Karena Addison Aroor. On further review, I agree there is abnormal T2-weighted signal intensity in the upper thoracic spinal cord. There is no spinal cord expansion. There are no abnormal flow voids along the surface of the spinal cord. This could indicate a subacute to chronic spinal cord infarction. Demyelinating disease or transverse myelitis are also possibilities.  The patient is undergoing a contrast-enhanced cervical and thoracic spine MRI. Electronically Signed   By: Ulyses Jarred M.D.   On: 03/10/2020 19:12   Result Date: 03/10/2020 CLINICAL DATA:  Periodic paralysis EXAM: MRI CERVICAL, THORACIC AND LUMBAR SPINE WITHOUT CONTRAST TECHNIQUE: Multiplanar and multiecho pulse sequences of the cervical spine, to include the craniocervical junction and cervicothoracic junction, and thoracic and lumbar spine, were obtained without  intravenous contrast. COMPARISON:  None. FINDINGS: MRI CERVICAL SPINE FINDINGS Alignment: Physiologic. Vertebrae: No fracture, evidence of discitis, or bone lesion. Cord: Normal signal and morphology. Posterior Fossa, vertebral arteries, paraspinal tissues: Negative. Disc levels: C1-C2: Normal. C2-C3: Small central disc protrusion.  No stenosis. C3-C4: Uncovertebral hypertrophy and right asymmetric disc bulge. Moderate right foraminal stenosis. Moderate right facet hypertrophy. C4-C5: Mild right-greater-than-left facet hypertrophy. Minor disc bulge. Mild right foraminal stenosis. No spinal canal stenosis. C5-C6: Small disc bulge mild bilateral facet hypertrophy. Mild spinal canal stenosis. C6-C7: Small disc bulge with moderate spinal canal stenosis. C7-T1: Small central disc protrusion.  No spinal canal stenosis. MRI THORACIC SPINE FINDINGS Alignment:  Normal Vertebrae: No fracture, evidence of discitis, or bone lesion. Cord:  Normal Paraspinal and other soft tissues: There is a partially cystic lesion within the left renal lower pole with an internal fluid level. Disc levels: No large disc herniation or spinal canal stenosis. MRI LUMBAR SPINE FINDINGS Segmentation:  Standard Alignment:  Grade 1 anterolisthesis at L4-5 Vertebrae:  No fracture, evidence of discitis, or bone lesion. Conus medullaris and cauda equina: Conus extends to the L1 level. Conus and cauda equina appear normal. Paraspinal and other soft tissues: Negative Disc levels: T12-L1: Normal disc space and facets. No spinal canal or neuroforaminal stenosis. L1-L2: Normal disc space and facets. No spinal canal or neuroforaminal stenosis. L2-L3: Normal disc space and facets. No spinal canal or neuroforaminal stenosis. L3-L4: Minimal disc bulge.  Mild facet hypertrophy.  No stenosis. L4-L5: Moderate right asymmetric disc bulge with moderate bilateral facet hypertrophy. There is right lateral recess stenosis with mild central spinal canal stenosis. Moderate  right foraminal stenosis. L5-S1: Severe facet hypertrophy with small central disc protrusion. No spinal canal stenosis. No neural foraminal stenosis. Visualized sacrum: Normal. IMPRESSION: 1. No acute abnormality of the cervical, thoracic or lumbar spine. 2. Moderate C6-7 and mild C5-6 spinal canal stenosis. 3. Moderate right C3-4 neural foraminal stenosis. 4. Grade 1 anterolisthesis at L4-5 with mild spinal canal and right lateral recess stenosis and moderate right foraminal stenosis. Electronically Signed: By: Ulyses Jarred M.D. On: 03/09/2020 21:17   MR THORACIC SPINE W CONTRAST  Result Date: 03/10/2020 CLINICAL DATA:  Myelopathy, acute or progressive. EXAM: MRI THORACIC SPINE WITH CONTRAST TECHNIQUE: Multiplanar, multisequence MR imaging of the thoracic spine was performed following the administration of intravenous contrast. COMPARISON:  MRI thoracic spine without contrast yesterday CONTRAST:  9.8 mL Gadovist IV FINDINGS: 6 x 10 mm enhancing lesion in the T9 vertebral body on the left located posteriorly. This is low signal on T1 and high signal on T2 on yesterday's study and shows fairly homogeneous enhancement. This lesion is not visible on recent chest CT of 03/08/2020. No fracture or extraosseous extension. No other bone marrow lesions identified. Normal enhancement of the spinal cord.  No cord mass identified. Paraspinous soft tissues enhance normally. IMPRESSION: 6 x 10 mm enhancing lesion left T9 vertebral body. This appearance is nonspecific and could be seen with benign lesion such as atypical hemangioma versus neoplasm such as metastatic disease. The patient has history of melanoma. There  is no cord compression or cause for myelopathy identified. Electronically Signed   By: Franchot Gallo M.D.   On: 03/10/2020 19:06   MR LUMBAR SPINE WO CONTRAST  Addendum Date: 03/10/2020   ADDENDUM REPORT: 03/10/2020 19:12 ADDENDUM: Examination discussed with Dr. Macy Mis, who had previously discussed  with Dr. Karena Addison Aroor. On further review, I agree there is abnormal T2-weighted signal intensity in the upper thoracic spinal cord. There is no spinal cord expansion. There are no abnormal flow voids along the surface of the spinal cord. This could indicate a subacute to chronic spinal cord infarction. Demyelinating disease or transverse myelitis are also possibilities. The patient is undergoing a contrast-enhanced cervical and thoracic spine MRI. Electronically Signed   By: Ulyses Jarred M.D.   On: 03/10/2020 19:12   Result Date: 03/10/2020 CLINICAL DATA:  Periodic paralysis EXAM: MRI CERVICAL, THORACIC AND LUMBAR SPINE WITHOUT CONTRAST TECHNIQUE: Multiplanar and multiecho pulse sequences of the cervical spine, to include the craniocervical junction and cervicothoracic junction, and thoracic and lumbar spine, were obtained without intravenous contrast. COMPARISON:  None. FINDINGS: MRI CERVICAL SPINE FINDINGS Alignment: Physiologic. Vertebrae: No fracture, evidence of discitis, or bone lesion. Cord: Normal signal and morphology. Posterior Fossa, vertebral arteries, paraspinal tissues: Negative. Disc levels: C1-C2: Normal. C2-C3: Small central disc protrusion.  No stenosis. C3-C4: Uncovertebral hypertrophy and right asymmetric disc bulge. Moderate right foraminal stenosis. Moderate right facet hypertrophy. C4-C5: Mild right-greater-than-left facet hypertrophy. Minor disc bulge. Mild right foraminal stenosis. No spinal canal stenosis. C5-C6: Small disc bulge mild bilateral facet hypertrophy. Mild spinal canal stenosis. C6-C7: Small disc bulge with moderate spinal canal stenosis. C7-T1: Small central disc protrusion.  No spinal canal stenosis. MRI THORACIC SPINE FINDINGS Alignment:  Normal Vertebrae: No fracture, evidence of discitis, or bone lesion. Cord:  Normal Paraspinal and other soft tissues: There is a partially cystic lesion within the left renal lower pole with an internal fluid level. Disc levels: No  large disc herniation or spinal canal stenosis. MRI LUMBAR SPINE FINDINGS Segmentation:  Standard Alignment:  Grade 1 anterolisthesis at L4-5 Vertebrae:  No fracture, evidence of discitis, or bone lesion. Conus medullaris and cauda equina: Conus extends to the L1 level. Conus and cauda equina appear normal. Paraspinal and other soft tissues: Negative Disc levels: T12-L1: Normal disc space and facets. No spinal canal or neuroforaminal stenosis. L1-L2: Normal disc space and facets. No spinal canal or neuroforaminal stenosis. L2-L3: Normal disc space and facets. No spinal canal or neuroforaminal stenosis. L3-L4: Minimal disc bulge.  Mild facet hypertrophy.  No stenosis. L4-L5: Moderate right asymmetric disc bulge with moderate bilateral facet hypertrophy. There is right lateral recess stenosis with mild central spinal canal stenosis. Moderate right foraminal stenosis. L5-S1: Severe facet hypertrophy with small central disc protrusion. No spinal canal stenosis. No neural foraminal stenosis. Visualized sacrum: Normal. IMPRESSION: 1. No acute abnormality of the cervical, thoracic or lumbar spine. 2. Moderate C6-7 and mild C5-6 spinal canal stenosis. 3. Moderate right C3-4 neural foraminal stenosis. 4. Grade 1 anterolisthesis at L4-5 with mild spinal canal and right lateral recess stenosis and moderate right foraminal stenosis. Electronically Signed: By: Ulyses Jarred M.D. On: 03/09/2020 21:17   NM Myocar Multi W/Spect W/Wall Motion / EF  Result Date: 03/09/2020 CLINICAL DATA:  Recent onset of chest pain. History diabetes, remote myocardial infarction and coronary stenting. EXAM: MYOCARDIAL IMAGING WITH SPECT (REST AND PHARMACOLOGIC-STRESS) GATED LEFT VENTRICULAR WALL MOTION STUDY LEFT VENTRICULAR EJECTION FRACTION TECHNIQUE: Standard myocardial SPECT imaging was performed after resting intravenous injection  of 11 mCi Tc-20m tetrofosmin. Subsequently, intravenous infusion of Lexiscan was performed under the supervision  of the Cardiology staff. At peak effect of the drug, 29 mCi Tc-30m tetrofosmin was injected intravenously and standard myocardial SPECT imaging was performed. Quantitative gated imaging was also performed to evaluate left ventricular wall motion, and estimate left ventricular ejection fraction. COMPARISON:  Images from nuclear medicine myocardial perfusion study 09/25/2013 without report FINDINGS: Perfusion: No decreased activity in the left ventricle on stress imaging to suggest reversible ischemia or infarction. Mildly prominent subdiaphragmatic activity on the rest images. Wall Motion: Normal left ventricular wall motion. No left ventricular dilation. Left Ventricular Ejection Fraction: 53 % End diastolic volume 91 ml End systolic volume 43 ml IMPRESSION: 1. No reversible ischemia or infarction. 2. Normal left ventricular wall motion. 3. Left ventricular ejection fraction 53% 4. Non invasive risk stratification*: Low *2012 Appropriate Use Criteria for Coronary Revascularization Focused Update: J Am Coll Cardiol. 8916;94(5):038-882. http://content.airportbarriers.com.aspx?articleid=[email or web address] Electronically Signed   By: Richardean Sale M.D.   On: 03/09/2020 16:43   DG FL GUIDED LUMBAR PUNCTURE  Result Date: 03/10/2020 CLINICAL DATA:  Paraplegia. EXAM: DIAGNOSTIC LUMBAR PUNCTURE UNDER FLUOROSCOPIC GUIDANCE FLUOROSCOPY TIME:  Fluoroscopy Time:  2 minutes, 36 seconds Radiation Exposure Index (if provided by the fluoroscopic device): 73.0 mGy Number of Acquired Spot Images: 5 PROCEDURE: Informed consent was obtained from the patient prior to the procedure, including potential complications of headache, allergy, and pain. With the patient prone, the lower back was prepped with Betadine. 1% Lidocaine was used for local anesthesia. Lumbar puncture was performed at the L2-L3 level using a 20 gauge needle with return of clear CSF. 12 ml of CSF were obtained for laboratory studies. The patient tolerated the procedure  well and there were no immediate postprocedure complications. IMPRESSION: Technically successful fluoroscopically guided L2-L3 lumbar puncture. No immediate postprocedure complication. 12 mL of CSF were obtained for laboratory studies. Electronically Signed   By: Kellie Simmering DO   On: 03/10/2020 17:18    Assessment/Plan: Diagnosis: Bilateral hand and lower extremity weakness Labs and images (see above) independently reviewed.  Records reviewed and summated above.  1. Does the need for close, 24 hr/day medical supervision in concert with the patient's rehab needs make it unreasonable for this patient to be served in a less intensive setting? Yes  2. 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 Due to bladder management, bowel management, safety, skin/wound care, disease management, medication administration and patient education, does the patient require 24 hr/day rehab nursing? Yes 4. Does the patient require coordinated care of a physician, rehab nurse, therapy disciplines of PT/OT to address physical and functional deficits in the context of the above medical diagnosis(es)? Yes Addressing deficits in the following areas: balance, endurance, locomotion, strength, transferring, bathing, dressing, toileting and psychosocial support 5. Can the patient actively participate in an intensive therapy program of at least 3 hrs of therapy per day at least 5 days per week? Yes 6. The potential for patient to make measurable gains while on inpatient rehab is excellent 7. Anticipated functional outcomes upon discharge  from inpatient rehab are min assist and mod assist  with PT, min assist and mod assist with OT, n/a with SLP. 8. Estimated  rehab length of stay to reach the above functional goals is: 18-23 days. 9. Anticipated discharge destination: Home 10. Overall Rehab/Functional Prognosis: good  RECOMMENDATIONS: This patient's condition is appropriate for continued rehabilitative care in the following setting: Await completion of medical work-up.  Recommend CIR if amenable diagnosis. Patient has agreed to participate in recommended program. Yes Note that insurance prior authorization may be required for reimbursement for recommended care.  Comment: Rehab Admissions Coordinator to follow up.  I have personally performed a face to face diagnostic evaluation, including, but not limited to relevant history and physical exam findings, of this patient and developed relevant assessment and plan.  Additionally, I have reviewed and concur with the physician assistant's documentation above.   Delice Lesch, MD, ABPMR Lavon Paganini Angiulli, PA-C 03/11/2020

## 2020-03-11 NOTE — Progress Notes (Addendum)
Reason for consult: Acute myelopathy  Subjective: Patient is lying in bed, appears clinically stable compared to yesterday.   ROS: negative except above   Examination  Vital signs in last 24 hours: Temp:  [97.6 F (36.4 C)-98.6 F (37 C)] 98.2 F (36.8 C) (06/23 1842) Pulse Rate:  [60-65] 65 (06/23 1842) Resp:  [14-20] 20 (06/23 1842) BP: (107-127)/(49-60) 127/60 (06/23 1842) SpO2:  [94 %-96 %] 95 % (06/23 1842)  General: lying in bed CVS: pulse-normal rate and rhythm RS: breathing comfortably Extremities: normal   Neuro: MS: Alert oriented x3 CN: Normal 2-12 Motor exam: LLE : 1/5 RLE 1/5 RUE: 4+/5 proximal muscles, wrist drop LUE: 4+/5 proximal muscles, wrist drop Reflexes:  2+ bilateral biceps, 2+ bilateral triceps and 2+ bilateral brachioradialis. He had 4+ patellar reflexes with patellar clonus bilaterally, difficult to get ankle clonus bilaterally.  Increased tone in lower extremities. Sensory exam: Sensory level around T6- T9.  Reduced sensation to light touch, temperature, vibration in bilateral lower extremities. Coordination: No gross ataxia noted Gait: unable to walk  Basic Metabolic Panel: Recent Labs  Lab 03/08/20 1142 03/10/20 0319 03/11/20 0222  NA 139 134* 133*  K 4.0 3.8 4.3  CL 103 102 100  CO2 24 21* 21*  GLUCOSE 229* 176* 189*  BUN 18 37* 60*  CREATININE 0.96 1.62* 1.74*  CALCIUM 9.5 9.3 8.9    CBC: Recent Labs  Lab 03/08/20 1142 03/10/20 0319 03/11/20 0222  WBC 7.2 8.3 9.1  NEUTROABS 3.8  --   --   HGB 13.8 13.1 13.4  HCT 42.2 40.1 40.4  MCV 89.0 89.3 88.0  PLT 173 174 183     Coagulation Studies: No results for input(s): LABPROT, INR in the last 72 hours.  Imaging Reviewed:     ASSESSMENT AND PLAN  74 year old male with past medical history significant for coronary artery disease, diabetes mellitus, hypertension, hyperlipidemia, obstructive sleep apnea, obesity presents to the emergency department with chest pain,  diaphoresis as well as sharp back pain-CTA ruled out dissection.  Subsequently had progressive weakness, starting with right leg and left leg and then bilateral distal upper extremity weakness.  Sensory level T6-T9.  Stat MRI C, T, L spine performed which was reported as negative yesterday: however, on my review shows subtle signal change or cervical, thoracic spine.  I discussed with neuro rads who also agree there is subtle signal abnormality in thoracic spine.  Edited report: "Examination discussed with Dr. Macy Mis, who had previously discussed with Dr. Karena Addison Henretta Quist. On further review, I agree there is abnormal T2-weighted signal intensity in the upper thoracic spinal cord. There is no spinal cord expansion. There are no abnormal flow voids along the surface of the spinal cord. This could indicate a subacute to chronic spinal cord infarction. Demyelinating disease or transverse myelitis are also possibilities. The patient is undergoing a contrast-enhanced cervical and thoracic spine MRI. MRI C-spine and T-spine with contrast did not show any contrast-enhancement"  Underwent LP under fluoroscopy :  CSF cell count 1 Protein 53 IgG index still pending  Other labs include B12 714 B1 pending Ceruloplasmin normal Folate normal A1c 6.7  Acute myelopathy with signal change in upper thoracic spinal cord with no contrast-enhancement -Differential includes spinal cord infarct versus transverse myelitis  Recommendations -IV Solumedrol 1 g x 5 days, being given empirically. CSF findings do not support typical transverse myelitis. -Consider aspirin 81 mg daily and atorvastatin 20 mg daily -Protonix 40 mg daily -NIF every 12 hours -Frequent  neurochecks Although progressive weakness over 1 day atypical for spinal cord infarct would recommend stroke work-up including echocardiogram, MRI brain, carotid doppler, lipid profile.   Karena Addison Zellie Jenning [facility] Pager Number  [phone] For questions after 7pm please refer to AMION to reach the Neurologist on call

## 2020-03-12 ENCOUNTER — Inpatient Hospital Stay (HOSPITAL_COMMUNITY): Payer: Medicare Other

## 2020-03-12 DIAGNOSIS — I639 Cerebral infarction, unspecified: Secondary | ICD-10-CM

## 2020-03-12 DIAGNOSIS — R079 Chest pain, unspecified: Secondary | ICD-10-CM

## 2020-03-12 LAB — CBC WITH DIFFERENTIAL/PLATELET
Abs Immature Granulocytes: 0.07 10*3/uL (ref 0.00–0.07)
Basophils Absolute: 0 10*3/uL (ref 0.0–0.1)
Basophils Relative: 0 %
Eosinophils Absolute: 0 10*3/uL (ref 0.0–0.5)
Eosinophils Relative: 0 %
HCT: 39.2 % (ref 39.0–52.0)
Hemoglobin: 12.9 g/dL — ABNORMAL LOW (ref 13.0–17.0)
Immature Granulocytes: 1 %
Lymphocytes Relative: 23 %
Lymphs Abs: 2.5 10*3/uL (ref 0.7–4.0)
MCH: 29.3 pg (ref 26.0–34.0)
MCHC: 32.9 g/dL (ref 30.0–36.0)
MCV: 88.9 fL (ref 80.0–100.0)
Monocytes Absolute: 0.4 10*3/uL (ref 0.1–1.0)
Monocytes Relative: 4 %
Neutro Abs: 7.9 10*3/uL — ABNORMAL HIGH (ref 1.7–7.7)
Neutrophils Relative %: 72 %
Platelets: 183 10*3/uL (ref 150–400)
RBC: 4.41 MIL/uL (ref 4.22–5.81)
RDW: 13.2 % (ref 11.5–15.5)
WBC: 10.9 10*3/uL — ABNORMAL HIGH (ref 4.0–10.5)
nRBC: 0 % (ref 0.0–0.2)

## 2020-03-12 LAB — GLUCOSE, CAPILLARY
Glucose-Capillary: 162 mg/dL — ABNORMAL HIGH (ref 70–99)
Glucose-Capillary: 126 mg/dL — ABNORMAL HIGH (ref 70–99)
Glucose-Capillary: 162 mg/dL — ABNORMAL HIGH (ref 70–99)
Glucose-Capillary: 168 mg/dL — ABNORMAL HIGH (ref 70–99)

## 2020-03-12 LAB — BASIC METABOLIC PANEL
Anion gap: 13 (ref 5–15)
BUN: 77 mg/dL — ABNORMAL HIGH (ref 8–23)
CO2: 17 mmol/L — ABNORMAL LOW (ref 22–32)
Calcium: 8.6 mg/dL — ABNORMAL LOW (ref 8.9–10.3)
Chloride: 103 mmol/L (ref 98–111)
Creatinine, Ser: 1.64 mg/dL — ABNORMAL HIGH (ref 0.61–1.24)
GFR calc Af Amer: 47 mL/min — ABNORMAL LOW (ref >60)
GFR calc non Af Amer: 40 mL/min — ABNORMAL LOW (ref >60)
Glucose, Bld: 235 mg/dL — ABNORMAL HIGH (ref 70–99)
Potassium: 4.1 mmol/L (ref 3.5–5.1)
Sodium: 133 mmol/L — ABNORMAL LOW (ref 135–145)

## 2020-03-12 MED ORDER — DOCUSATE SODIUM 100 MG PO CAPS
100.0000 mg | ORAL_CAPSULE | Freq: Two times a day (BID) | ORAL | Status: DC
  Administered 2020-03-12 – 2020-03-25 (×27): 100 mg via ORAL
  Filled 2020-03-12 (×27): qty 1

## 2020-03-12 MED ORDER — INSULIN GLARGINE 100 UNIT/ML ~~LOC~~ SOLN
30.0000 [IU] | Freq: Every day | SUBCUTANEOUS | Status: DC
  Administered 2020-03-12 – 2020-03-14 (×3): 30 [IU] via SUBCUTANEOUS
  Filled 2020-03-12 (×4): qty 0.3

## 2020-03-12 MED ORDER — POLYETHYLENE GLYCOL 3350 17 G PO PACK
17.0000 g | PACK | Freq: Every day | ORAL | Status: DC
  Administered 2020-03-12 – 2020-03-25 (×14): 17 g via ORAL
  Filled 2020-03-12 (×14): qty 1

## 2020-03-12 MED ORDER — ENOXAPARIN SODIUM 60 MG/0.6ML ~~LOC~~ SOLN
50.0000 mg | SUBCUTANEOUS | Status: DC
  Administered 2020-03-13 – 2020-03-25 (×13): 50 mg via SUBCUTANEOUS
  Filled 2020-03-12 (×13): qty 0.6

## 2020-03-12 MED ORDER — ENOXAPARIN SODIUM 40 MG/0.4ML ~~LOC~~ SOLN
40.0000 mg | SUBCUTANEOUS | Status: DC
  Administered 2020-03-12: 40 mg via SUBCUTANEOUS
  Filled 2020-03-12: qty 0.4

## 2020-03-12 NOTE — Progress Notes (Signed)
Carotid duplex bilateral study completed.   See Cv Proc for preliminary results.   Brent [name]  

## 2020-03-12 NOTE — Progress Notes (Signed)
NIF >-40 VC: 1.8L with good pt effort  

## 2020-03-12 NOTE — Progress Notes (Signed)
PROGRESS NOTE    [name].  IRC:[id number] DOB: Aug 01, 1946 DOA: 03/08/2020 PCP: [name], [name], MD    Brief Narrative:  74 y.o. male with medical history significant of remote MI stenting x3 in 1990s, CAD, IDDM, DM neuropathy, sciatica with injection treatment 2 years ago, HTN, HLD, came with new onset of chest pain which started when patient was in the church and standing.  Pressure-like 8-9 over 10, associated with feeling of nausea and sweating no vomiting no short of breath no palpitations.  EMS arrived and gave patient some aspirin nitroglycerin sublingual with little improvement.  Then upon arrival in the ED the chest pain slowly progressed to radiate to the left shoulder and his back, then slowly subsided.  While staying in the ED patient started to feel weakness of the whole right-sided leg, he remember that while lying on the stretcher in the ambulance, the stretcher had a bump right beneath his right hip area, where he had " bad inflamed nerve which received injection about 2 years ago in [place]".   He denieD any numbness or loss of feeling but only weakness from hip to toe on the right side.  He denied baseline claudication, he does have chronic back pain on and off.  Patient also admitted he has a baseline diabetic neuropathy with frequent on and off tingling sensation of toes and fingertips. Upon arrival to ED, troponin II sets negative, CTA dissection study negative.  Patient was admitted to hospital service.  Cardiology was consulted.  He underwent nuclear stress test which was unremarkable.  He then started having weakness in left lower extremity as well as decreased grip strength in bilateral hands.  Neurology was consulted.  Spine MRI was ordered which was without any remarkable acute findings.  CT head was unremarkable.  LP was also unremarkable.  His initial working diagnosis was transverse myelitis however the work-up has not been indicative of that.  Patient was  started on high-dose Solu-Medrol 1000 mg IV daily for 5 days starting 03/10/2020.  Assessment & Plan:   Active Problems:   Chest pain   Weakness   Quadriplegia (HCC)   Coronary artery disease involving native coronary artery of native heart without angina pectoris   Steroid-induced hyperglycemia   Diabetic peripheral neuropathy (HCC)   Tachypnea   Hyponatremia   AKI (acute kidney injury) ([place])  Chest pain -Pt has significant prior cardiac history -Given risk factors, Cardiology consulted and pt underwent nuclear stress test on 6/21, reviewed, with no reversible ischemia or infarct. LVEF of 53%, normal LV wall motion. Low risk study -Continue his cardiac meds as tolerated  Progressive BLE weakness, numbness, loss of B hand grip strength  -new findings since initial presentation -Symptoms initially with RLE, since progressing to involve BLE weakness and B grip strength.  -Appreciate assistance by Neurology.  Spine MRI and LP done and arranged by neurology.  He was started on high-dose Solu-Medrol for 5 days with first day starting on 03/10/2020.  The work-up so far has not been indicated of transverse myelitis but this is so far his working diagnosis per neurology and he is getting Solu-Medrol empirically for that.  He has not noted any improvement in his weakness since started on Solu-Medrol.  Management deferred to neurology.  Appreciate their help.  IDDM/type 2 diabetes mellitus Significantly elevated which was expected due to being on high-dose steroids.  We will increase his Lantus to 30 units from 10 units and continue SSI.  Essential HTN -  Continue home meds -BP stable and controlled at this time  HLD -Continue with statin as tolerated  Obesity -Recommend diet/lifestyle modification  Hx malignant melanoma -chart reviewed. Pt s/p biopsy on 10/19, followed by [facility] -Seems to be stable currently  AKI/dehydration: Came in with normal GFR.  Creatinine jumped to 1.62 and now  to 1.74.  Renal function has remained stable.  Continue IV fluids and recheck in the morning.  If no improvement, consider nephrology consultation.  DVT prophylaxis: Now on SCD, likely due to having LP yesterday.  We will start on Lovenox now that he has been 48 hours out of his LP. Code Status: Full Family Communication: Pt in room, family currently not at bedside  Status is: Inpatient  Remains inpatient appropriate because:Ongoing diagnostic testing needed not appropriate for outpatient work up and Inpatient level of care appropriate due to severity of illness  Dispo: The patient is from: Home              Anticipated d/c is to: CIR              Anticipated d/c date is: 3 days              Patient currently is not medically stable to d/c.     Consultants:   Cardiology  Neurology  Procedures:   Stress test 6/21  Antimicrobials: Anti-infectives (From admission, onward)   None      Subjective: Seen and examined.  Patient does not feel any improvement in weakness in bilateral lower extremities or handgrip.  No new complaint though.  Objective: Vitals:   03/12/20 0524 03/12/20 0619 03/12/20 0759 03/12/20 1103  BP: (!) 115/59  (!) 117/57 127/60  Pulse: 60 65 68 62  Resp: 15 18 20 19   Temp: 98.1 F (36.7 C)  97.7 F (36.5 C) 97.7 F (36.5 C)  TempSrc: Oral  Oral Oral  SpO2: 96% 98% 99% 96%  Weight:  102.7 kg    Height:        Intake/Output Summary (Last 24 hours) at 03/12/2020 1308 Last data filed at 03/12/2020 0900 Gross per 24 hour  Intake 1329.78 ml  Output 1750 ml  Net -420.22 ml   Filed Weights   03/08/20 1134 03/08/20 2044 03/12/20 0619  Weight: 101.2 kg 98.4 kg 102.7 kg    Examination:  General exam: Appears calm and comfortable  Respiratory system: Clear to auscultation. Respiratory effort normal. Cardiovascular system: S1 & S2 heard, RRR. No JVD, murmurs, rubs, gallops or clicks. No pedal edema. Gastrointestinal system: Abdomen is nondistended,  soft and nontender. No organomegaly or masses felt. Normal bowel sounds heard. Central nervous system: Alert and oriented.  Power 0/5 in bilateral lower extremities.  Weak handgrip. Skin: No rashes, lesions or ulcers.  Psychiatry: Judgement and insight appear normal. Mood & affect appropriate.     Data Reviewed: I have personally reviewed following labs and imaging studies  CBC: Recent Labs  Lab 03/08/20 1142 03/10/20 0319 03/11/20 0222 03/12/20 0339  WBC 7.2 8.3 9.1 10.9*  NEUTROABS 3.8  --   --  7.9*  HGB 13.8 13.1 13.4 12.9*  HCT 42.2 40.1 40.4 39.2  MCV 89.0 89.3 88.0 88.9  PLT 173 174 183 628   Basic Metabolic Panel: Recent Labs  Lab 03/08/20 1142 03/10/20 0319 03/11/20 0222 03/12/20 0339  NA 139 134* 133* 133*  K 4.0 3.8 4.3 4.1  CL 103 102 100 103  CO2 24 21* 21* 17*  GLUCOSE 229* 176* 189*  235*  BUN 18 37* 60* 77*  CREATININE 0.96 1.62* 1.74* 1.64*  CALCIUM 9.5 9.3 8.9 8.6*   GFR: Estimated Creatinine Clearance: 48.2 mL/min (A) (by C-G formula based on SCr of 1.64 mg/dL (H)). Liver Function Tests: Recent Labs  Lab 03/08/20 1142 03/10/20 0319 03/11/20 0222  AST 40 29 32  ALT 30 22 24   ALKPHOS 71 44 45  BILITOT 0.8 0.6 0.6  PROT 6.5 6.2* 6.3*  ALBUMIN 4.0 3.7 3.6   Recent Labs  Lab 03/08/20 1142  LIPASE 29   No results for input(s): AMMONIA in the last 168 hours. Coagulation Profile: No results for input(s): INR, PROTIME in the last 168 hours. Cardiac Enzymes: No results for input(s): CKTOTAL, CKMB, CKMBINDEX, TROPONINI in the last 168 hours. BNP (last 3 results) No results for input(s): PROBNP in the last 8760 hours. HbA1C: No results for input(s): HGBA1C in the last 72 hours. CBG: Recent Labs  Lab 03/11/20 1120 03/11/20 1805 03/11/20 2338 03/12/20 0615 03/12/20 1104  GLUCAP 232* 423* 303* 162* 126*   Lipid Profile: No results for input(s): CHOL, HDL, LDLCALC, TRIG, CHOLHDL, LDLDIRECT in the last 72 hours. Thyroid Function  Tests: No results for input(s): TSH, T4TOTAL, FREET4, T3FREE, THYROIDAB in the last 72 hours. Anemia Panel: Recent Labs    03/09/20 1823  VITAMINB12 714  FOLATE 45.6   Sepsis Labs: No results for input(s): PROCALCITON, LATICACIDVEN in the last 168 hours.  Recent Results (from the past 240 hour(s))  SARS Coronavirus 2 by RT PCR (hospital order, performed in [facility] hospital lab) Nasopharyngeal Nasopharyngeal Swab     Status: None   Collection Time: 03/08/20 11:33 AM   Specimen: Nasopharyngeal Swab  Result Value Ref Range Status   SARS Coronavirus 2 NEGATIVE NEGATIVE Final    Comment: (NOTE) SARS-CoV-2 target nucleic acids are NOT DETECTED.  The SARS-CoV-2 RNA is generally detectable in upper and lower respiratory specimens during the acute phase of infection. The lowest concentration of SARS-CoV-2 viral copies this assay can detect is 250 copies / mL. A negative result does not preclude SARS-CoV-2 infection and should not be used as the sole basis for treatment or other patient management decisions.  A negative result may occur with improper specimen collection / handling, submission of specimen other than nasopharyngeal swab, presence of viral mutation(s) within the areas targeted by this assay, and inadequate number of viral copies (<250 copies / mL). A negative result must be combined with clinical observations, patient history, and epidemiological information.  Fact Sheet for Patients:   StrictlyIdeas.no  Fact Sheet for Healthcare Providers: BankingDealers.co.za  This test is not yet approved or  cleared by the [place] FDA and has been authorized for detection and/or diagnosis of SARS-CoV-2 by FDA under an Emergency Use Authorization (EUA).  This EUA will remain in effect (meaning this test can be used) for the duration of the COVID-19 declaration under Section 564(b)(1) of the Act, 21 U.S.C. section  360bbb-3(b)(1), unless the authorization is terminated or revoked sooner.  Performed at [facility], [place] [address] [name], [place] [address]   Gram stain     Status: None   Collection Time: 03/10/20  5:15 PM   Specimen: PATH Cytology CSF; Cerebrospinal Fluid  Result Value Ref Range Status   Specimen Description CSF  Final   Special Requests NONE  Final   Gram Stain   Final    WBC PRESENT, PREDOMINANTLY PMN NO ORGANISMS SEEN CYTOSPIN SMEAR Performed at [facility]  Lab, [address] [place], [place] [address]    Report Status 03/10/2020 FINAL  Final     Radiology Studies: CT HEAD WO CONTRAST  Result Date: 03/10/2020 CLINICAL DATA:  Periodic paralysis.  Pre lumbar puncture evaluation. EXAM: CT HEAD WITHOUT CONTRAST TECHNIQUE: Contiguous axial images were obtained from the base of the skull through the vertex without intravenous contrast. COMPARISON:  CT head 07/03/2015 FINDINGS: Brain: Ventricle size normal. Mild generalized atrophy. Negative for acute infarct or hemorrhage. Retro cerebellar arachnoid cyst measuring 2.7 x 5.8 cm unchanged from the prior study. Benign cysts in the basal ganglia bilaterally unchanged. Vascular: Negative for hyperdense vessel Skull: No focal skeletal lesion. Sinuses/Orbits: Mucosal edema paranasal sinuses.  Negative orbit. Other: None IMPRESSION: No acute abnormality Retro cerebellar arachnoid cyst is stable. Electronically Signed   By: Franchot Gallo M.D.   On: 03/10/2020 16:22   MR BRAIN WO CONTRAST  Result Date: 03/11/2020 CLINICAL DATA:  Stroke follow-up EXAM: MRI HEAD WITHOUT CONTRAST TECHNIQUE: Multiplanar, multiecho pulse sequences of the brain and surrounding structures were obtained without intravenous contrast. COMPARISON:  Head CT 03/10/2020 FINDINGS: BRAIN: No acute infarct, acute hemorrhage or extra-axial collection. Normal white matter signal. There is mild generalized atrophy. Unchanged mega cisterna magna. No chronic  microhemorrhage. Normal midline structures. VASCULAR: Major flow voids are preserved. SKULL AND UPPER CERVICAL SPINE: Normal calvarium and skull base. Visualized upper cervical spine and soft tissues are normal. SINUSES/ORBITS: No paranasal sinus fluid levels or advanced mucosal thickening. No mastoid or middle ear effusion. Normal orbits. IMPRESSION: 1. No acute intracranial process. 2. Mild generalized atrophy. Electronically Signed   By: Ulyses Jarred M.D.   On: 03/11/2020 22:51   MR CERVICAL SPINE W CONTRAST  Result Date: 03/10/2020 CLINICAL DATA:  Myelopathy, acute or progressive. EXAM: MRI CERVICAL SPINE WITH CONTRAST TECHNIQUE: Multiplanar, multisequence MR imaging of the cervical spine was performed following the administration of intravenous contrast. COMPARISON:  MRI cervical spine without contrast yesterday FINDINGS: Normal enhancement following contrast infusion. No enhancing vertebral lesion. No enhancing lesion in the cord. Paraspinous negative for soft tissue mass or abnormal enhancement. Multilevel degenerative change in the cervical spine as described on the report from yesterday. IMPRESSION: Normal enhancement of the cervical spine and cord. No mass lesion identified. Cervical spondylosis. Electronically Signed   By: Franchot Gallo M.D.   On: 03/10/2020 18:55   MR THORACIC SPINE W CONTRAST  Result Date: 03/10/2020 CLINICAL DATA:  Myelopathy, acute or progressive. EXAM: MRI THORACIC SPINE WITH CONTRAST TECHNIQUE: Multiplanar, multisequence MR imaging of the thoracic spine was performed following the administration of intravenous contrast. COMPARISON:  MRI thoracic spine without contrast yesterday CONTRAST:  9.8 mL Gadovist IV FINDINGS: 6 x 10 mm enhancing lesion in the T9 vertebral body on the left located posteriorly. This is low signal on T1 and high signal on T2 on yesterday's study and shows fairly homogeneous enhancement. This lesion is not visible on recent chest CT of 03/08/2020. No  fracture or extraosseous extension. No other bone marrow lesions identified. Normal enhancement of the spinal cord.  No cord mass identified. Paraspinous soft tissues enhance normally. IMPRESSION: 6 x 10 mm enhancing lesion left T9 vertebral body. This appearance is nonspecific and could be seen with benign lesion such as atypical hemangioma versus neoplasm such as metastatic disease. The patient has history of melanoma. There is no cord compression or cause for myelopathy identified. Electronically Signed   By: Franchot Gallo M.D.   On: 03/10/2020 19:06   DG FL  GUIDED LUMBAR PUNCTURE  Result Date: 03/10/2020 CLINICAL DATA:  Paraplegia. EXAM: DIAGNOSTIC LUMBAR PUNCTURE UNDER FLUOROSCOPIC GUIDANCE FLUOROSCOPY TIME:  Fluoroscopy Time:  2 minutes, 36 seconds Radiation Exposure Index (if provided by the fluoroscopic device): 73.0 mGy Number of Acquired Spot Images: 5 PROCEDURE: Informed consent was obtained from the patient prior to the procedure, including potential complications of headache, allergy, and pain. With the patient prone, the lower back was prepped with Betadine. 1% Lidocaine was used for local anesthesia. Lumbar puncture was performed at the L2-L3 level using a 20 gauge needle with return of clear CSF. 12 ml of CSF were obtained for laboratory studies. The patient tolerated the procedure well and there were no immediate postprocedure complications. IMPRESSION: Technically successful fluoroscopically guided L2-L3 lumbar puncture. No immediate postprocedure complication. 12 mL of CSF were obtained for laboratory studies. Electronically Signed   By: Kellie Simmering DO   On: 03/10/2020 17:18    Scheduled Meds: . aspirin EC  81 mg Oral QHS  . docusate sodium  100 mg Oral BID  . enoxaparin (LOVENOX) injection  40 mg Subcutaneous Q24H  . fenofibrate  160 mg Oral QHS  . fluticasone  2 spray Each Nare QHS  . icosapent Ethyl  2 g Oral BID  . insulin aspart  0-5 Units Subcutaneous QHS  . insulin aspart   0-9 Units Subcutaneous TID WC  . insulin aspart  25 Units Subcutaneous TID AC  . insulin glargine  30 Units Subcutaneous Daily  . insulin glargine  60 Units Subcutaneous QHS  . lidocaine  1 patch Transdermal Q24H  . multivitamin with minerals  1 tablet Oral Daily  . nitroGLYCERIN  1 inch Topical Q6H  . oxybutynin  5 mg Oral BID  . pantoprazole  40 mg Oral Daily  . polyethylene glycol  17 g Oral Daily  . rosuvastatin  40 mg Oral QHS  . sertraline  150 mg Oral QHS  . vitamin B-12  500 mcg Oral Daily  . zolpidem  10 mg Oral QHS   Continuous Infusions: . sodium chloride Stopped (03/10/20 2249)  . sodium chloride 125 mL/hr at 03/12/20 0618  . methylPREDNISolone (SOLU-MEDROL) injection 1,000 mg (03/12/20 0934)     LOS: 3 days   Darliss Cheney, MD [facility] Pager On Amion  If 7PM-7AM, please contact night-coverage 03/12/2020, 1:08 PM

## 2020-03-12 NOTE — Progress Notes (Signed)
Inpatient Diabetes Program Recommendations  AACE/ADA: New Consensus Statement on Inpatient Glycemic Control (2015)  Target Ranges:  Prepandial:   less than 140 mg/dL      Peak postprandial:   less than 180 mg/dL (1-2 hours)      Critically ill patients:  140 - 180 mg/dL   Lab Results  Component Value Date   GLUCAP 126 (H) 03/12/2020   HGBA1C 6.7 (H) 03/09/2020    Review of Glycemic Control Results for [name], [name]. "B[name] (MRN [id number]) as of 03/12/2020 11:21  Ref. Range 03/11/2020 11:20 03/11/2020 18:05 03/11/2020 23:38 03/12/2020 06:15 03/12/2020 11:04  Glucose-Capillary Latest Ref Range: 70 - 99 mg/dL 232 (H) 423 (H) 303 (H) 162 (H) 126 (H)   Diabetes history:  DM 2 Outpatient Diabetes medications: Jardiance 12.5 daily + novolog 25-30 tid + Lantus 60 units daily + Metformin 1000 bid Current orders for Inpatient glycemic control:  Lantus 30 units qam (increased from 10 units yesterday) Lantus 60 units qpm Novolog 0-9 units tid + hs scale Novolog 25 units tid meal coverage  Solumedrol 1000 mg Daily  Inpatient Diabetes Program Recommendations:    Glucose trends today 162/126, with lantus 10 units yesterday am and 60 units in the evening.   Noted pt already received Lantus 30 units this am. -   Consider reducing Lantus doses to prevent hypoglycemia  Would recommend reducing Lantus to 15 units qam and 60 units qpm and watch with meal coverage, if trends increase after meal intake with Solumedrol increase meal coverage and correction scale.  Thanks,  Tama Headings RN, MSN, BC-ADM Inpatient Diabetes Coordinator Team Pager [phone] (8a-5p)

## 2020-03-12 NOTE — Progress Notes (Addendum)
Reason for consult:   Subjective: Patient alert and oriented.  States there is no significant improvement after initiation of steroids.  Request for second opinion.   ROS: negative except above  Examination  Vital signs in last 24 hours: Temp:  [97.7 F (36.5 C)-98.3 F (36.8 C)] 98.3 F (36.8 C) (06/24 1642) Pulse Rate:  [56-68] 62 (06/24 1642) Resp:  [15-20] 19 (06/24 1642) BP: (115-127)/(55-62) 120/62 (06/24 1642) SpO2:  [95 %-99 %] 96 % (06/24 1642) Weight:  [102.7 kg] 102.7 kg (06/24 0619)  General: lying in bed CVS: pulse-normal rate and rhythm RS: breathing comfortably Extremities: normal   Neuro: MS: Alert oriented x3 CN: Normal 2-12 Motor exam: LLE : 1/5 RLE 1/5 RUE: 4+/5 proximal muscles, wrist drop LUE: 4+/5 proximal muscles, wrist drop Reflexes:  2+ bilateral biceps, 2+ bilateral triceps and 2+ bilateral brachioradialis. He had 4+ patellar reflexes with patellar clonus bilaterally,difficult to get ankle clonus bilaterally.Increased tone in lower extremities. Sensory exam:Sensory level around T6- T9.Reduced sensation to light touch, temperature, vibration in bilateral lower extremities. Coordination: No gross ataxia noted Gait: unable to walk  Basic Metabolic Panel: Recent Labs  Lab 03/08/20 1142 03/08/20 1142 03/10/20 0319 03/11/20 0222 03/12/20 0339  NA 139  --  134* 133* 133*  K 4.0  --  3.8 4.3 4.1  CL 103  --  102 100 103  CO2 24  --  21* 21* 17*  GLUCOSE 229*  --  176* 189* 235*  BUN 18  --  37* 60* 77*  CREATININE 0.96  --  1.62* 1.74* 1.64*  CALCIUM 9.5   < > 9.3 8.9 8.6*   < > = values in this interval not displayed.    CBC: Recent Labs  Lab 03/08/20 1142 03/10/20 0319 03/11/20 0222 03/12/20 0339  WBC 7.2 8.3 9.1 10.9*  NEUTROABS 3.8  --   --  7.9*  HGB 13.8 13.1 13.4 12.9*  HCT 42.2 40.1 40.4 39.2  MCV 89.0 89.3 88.0 88.9  PLT 173 174 183 183   A hold  Coagulation Studies: No results for input(s): LABPROT, INR in the  last 72 hours.  Imaging: MRI brain : no acute findings,    ASSESSMENT AND PLAN   74 year old male with past medical history significant for coronary artery disease, diabetes mellitus, hypertension, hyperlipidemia, obstructive sleep apnea, obesity presents to the emergency department with chest pain, diaphoresis as well as sharp back pain-CTA ruled out dissection.  Subsequently had progressive weakness,starting with right leg and left leg and then bilateral distal upper extremity weakness. Sensory level T6-T9.  Stat MRI C, T, L spineperformed which was reported as negative yesterday:however,on my reviewshows subtlesignal change or cervical,thoracic spine.I discussed with neuro rads who also agree there is subtle signal abnormality in thoracic spine.  Edited report: "Examination discussed with Dr. Macy Mis, who had previously discussed with Dr. Karena Addison Delante Karapetyan. On further review, I agree there is abnormal T2-weighted signal intensity in the upper thoracic spinal cord. There is no spinal cord expansion. There are no abnormal flow voids along the surface of the spinal cord. This could indicate a subacute to chronic spinal cord infarction. Demyelinating disease or transverse myelitis are also possibilities. The patient is undergoing a contrast-enhanced cervical and thoracic spine MRI. MRI C-spine and T-spine with contrast did not show any contrast-enhancement"  Underwent LP under fluoroscopy :  CSF cell count 1 Protein 53 IgG index still pending  Other labs include B12 714 Ceruloplasmin normal Folate normal A1c 6.7 Pending:  NMO SSA,SSB ACE ANA, RF B1   Acute myelopathy with signal change in upper thoracic spinal cord with no contrast-enhancement -Differential includes spinal cord infarct versus longitudinal extensive transverse myelitis  Recommendations -IV Solumedrol 1 g x 5 days, being given empirically. CSF findings do not support typical transverse  myelitis. -Consider aspirin 81 mg daily and atorvastatin 20 mg daily -Protonix 40 mg daily -NIFevery 12 hours -Frequent neurochecks -MRI brain negative for acute infarct/demyelination.  Echocardiogram completed with pending results.  Telemetry so far does not show A. fib. - may consider paraneoplastic panel   Karena Addison Garnell Phenix [facility] Pager Number [phone] For questions after 7pm please refer to AMION to reach the Neurologist on call

## 2020-03-12 NOTE — Progress Notes (Addendum)
Ok to resume DVT prophylaxis lovenox per Dr. Lorraine Lax. We will use 0.5mg /kg/day due to wt.   Lovenox 50mg  SQ qday Rx will monitor peripherally  Onnie Boer, PharmD, BCIDP, AAHIVP, CPP Infectious Disease Pharmacist 03/12/2020 9:16 AM

## 2020-03-12 NOTE — Progress Notes (Signed)
Physical Therapy Treatment Patient Details Name: Brent [name]. MRN: [id number] DOB: 10-16-1945 Today's Date: 03/12/2020    History of Present Illness Pt adm 6/20 with chest pain and developed RLE weakness in ED. Pt for nuclear stress test on 6/21 where his weakness progressed to BLE's and Bil hands. Neuro consulted and work up in progress. PMH - CAD, DM, peripheral neuropathy, HTN, MI, obesity    PT Comments    Pt in bed upon arrival of PT/OT and eager to participate in therapy session with focus on progressing functional mobility and balance OOB. The pt was able to participate in multiple rolls in the supine position to allow for cleaning and positioning of lift pad prior to being lifted to the recliner. The pt was able to assist with rolling using a "hook" position with his UE. Although the pt's activities in a seated position have been limited in the past by extensor tone, the pt was able to tolerate being seated in the chair position and will continue to benefit from skilled PT to further progress functional transfer and stability training to reduce caregiver burden.     Follow Up Recommendations  CIR     Equipment Recommendations   (defer to post acute)    Recommendations for Other Services       Precautions / Restrictions Precautions Precautions: Fall Precaution Comments: sensation around T6-9 Restrictions Weight Bearing Restrictions: No    Mobility  Bed Mobility Overal bed mobility: Needs Assistance Bed Mobility: Rolling Rolling: Max assist         General bed mobility comments: maxA to roll with pt using elbow to "hook" around PT arm or bed rail to assist in roll. unable to assist with movement of LE.  Transfers Overall transfer level: Needs assistance Equipment used: Ambulation equipment used             General transfer comment: maximove used to assist patient to chair. BLEs placed in flexion to help break up tone  Ambulation/Gait Ambulation/Gait  assistance:  (pt unable)                     Balance Overall balance assessment: Needs assistance Sitting-balance support: Feet supported Sitting balance-Leahy Scale: Zero Sitting balance - Comments: extensor tone limiting                                    Cognition Arousal/Alertness: Awake/alert Behavior During Therapy: WFL for tasks assessed/performed Overall Cognitive Status: Within Functional Limits for tasks assessed                                 General Comments: pt agreeable and cooperative through session. Wife present and supportive      Exercises      General Comments        Pertinent Vitals/Pain Pain Assessment: No/denies pain Pain Intervention(s): Limited activity within patient's tolerance;Monitored during session;Repositioned           PT Goals (current goals can now be found in the care plan section) Acute Rehab PT Goals Patient Stated Goal: return to independence PT Goal Formulation: With patient Time For Goal Achievement: 03/24/20 Potential to Achieve Goals: Good Progress towards PT goals: Progressing toward goals    Frequency    Min 3X/week      PT Plan Current plan remains appropriate  Co-evaluation PT/OT/SLP Co-Evaluation/Treatment: Yes Reason for Co-Treatment: Complexity of the patient's impairments (multi-system involvement);To address functional/ADL transfers PT goals addressed during session: Mobility/safety with mobility;Balance        AM-PAC PT "6 Clicks" Mobility   Outcome Measure  Help needed turning from your back to your side while in a flat bed without using bedrails?: A Lot Help needed moving from lying on your back to sitting on the side of a flat bed without using bedrails?: Total Help needed moving to and from a bed to a chair (including a wheelchair)?: Total Help needed standing up from a chair using your arms (e.g., wheelchair or bedside chair)?: Total Help needed to walk  in hospital room?: Total Help needed climbing 3-5 steps with a railing? : Total 6 Click Score: 7    End of Session Equipment Utilized During Treatment:  (lift) Activity Tolerance: Patient tolerated treatment well Patient left: in bed;with call bell/phone within reach;with nursing/sitter in room Nurse Communication: Mobility status;Need for lift equipment;Other (comment) (lift equipment, need for air bed for pressure relief, pt would benefit from transitioning to room with maxisky) PT Visit Diagnosis: Other abnormalities of gait and mobility (R26.89);Other symptoms and signs involving the nervous system (R29.898)     Time: 1215-1300 PT Time Calculation (min) (ACUTE ONLY): 45 min  Charges:  $Therapeutic Activity: 23-37 mins                     Karma Ganja, PT, DPT   Acute Rehabilitation Department Pager #: [phone]   Otho Bellows 03/12/2020, 5:01 PM

## 2020-03-12 NOTE — Progress Notes (Signed)
Occupational Therapy Treatment Patient Details Name: Brent [name]. MRN: [id number] DOB: 1945/12/05 Today's Date: 03/12/2020    History of present illness Pt adm 6/20 with chest pain and developed RLE weakness in ED. Pt for nuclear stress test on 6/21 where his weakness progressed to BLE's and Bil hands. Neuro consulted and work up in progress. PMH - CAD, DM, peripheral neuropathy, HTN, MI, obesity   OT comments  Pt making steady progress toward OT goals. Focused session on mobility progression and AE education. Introduced feeding equipment to pt, was able to feed self lunch with set up assist with u-cuff while in chair position. Maximove then used to assist pt back to bed. Pt able to roll with max A and hooking arms with therapist to facilitate roll. Pt needed assist with BLEs in flexion to inhibit extensor tone. VSS, pt tolerated well. Wife educated and present in session. Will continue to follow. D/c recs remain appropriate.    Follow Up Recommendations  CIR    Equipment Recommendations  Wheelchair (measurements OT);Wheelchair cushion (measurements OT);Hospital bed    Recommendations for Other Services Rehab consult    Precautions / Restrictions Precautions Precautions: Fall Precaution Comments: sensation around T6-9 Restrictions Weight Bearing Restrictions: No       Mobility Bed Mobility Overal bed mobility: Needs Assistance Bed Mobility: Rolling Rolling: Max assist         General bed mobility comments: max A to roll for lift pad placement and bed linen change. Used UEs in "hooking" position to roll  Transfers   Equipment used: Ambulation equipment used             General transfer comment: maximove used to assist patient to chair. BLEs placed in flexion to help break up tone    Balance Overall balance assessment: Needs assistance Sitting-balance support: Feet supported Sitting balance-Leahy Scale: Zero                                      ADL either performed or assessed with clinical judgement   ADL Overall ADL's : Needs assistance/impaired Eating/Feeding: Set up;Bed level Eating/Feeding Details (indicate cue type and reason): issued pt U- cuff. Was able to feed self fruit with fork with set up while in chair position in bed.   Grooming Details (indicate cue type and reason): educated pt on using u cuff for brushing teeth, as well as compensating with electric toothbrush                                     Vision Baseline Vision/History: Wears glasses Wears Glasses: At all times Patient Visual Report: No change from baseline     Perception     Praxis      Cognition Arousal/Alertness: Awake/alert Behavior During Therapy: WFL for tasks assessed/performed Overall Cognitive Status: Within Functional Limits for tasks assessed                                          Exercises     Shoulder Instructions       General Comments      Pertinent Vitals/ Pain       Pain Assessment: No/denies pain  Home Living  Prior Functioning/Environment              Frequency  Min 2X/week        Progress Toward Goals  OT Goals(current goals can now be found in the care plan section)  Progress towards OT goals: Progressing toward goals  Acute Rehab OT Goals Patient Stated Goal: return to independence OT Goal Formulation: With patient Time For Goal Achievement: 03/25/20 Potential to Achieve Goals: Good  Plan Discharge plan remains appropriate    Co-evaluation                 AM-PAC OT "6 Clicks" Daily Activity     Outcome Measure   Help from another person eating meals?: A Little Help from another person taking care of personal grooming?: A Little Help from another person toileting, which includes using toliet, bedpan, or urinal?: Total Help from another person bathing (including washing, rinsing,  drying)?: Total Help from another person to put on and taking off regular upper body clothing?: A Lot Help from another person to put on and taking off regular lower body clothing?: Total 6 Click Score: 11    End of Session    OT Visit Diagnosis: Other abnormalities of gait and mobility (R26.89);Muscle weakness (generalized) (M62.81);Other symptoms and signs involving the nervous system (R29.898)   Activity Tolerance Patient tolerated treatment well   Patient Left in chair;with call bell/phone within reach;with family/visitor present   Nurse Communication Mobility status;Need for lift equipment        Time: 1212-1302 OT Time Calculation (min): 50 min  Charges: OT Treatments $Self Care/Home Management : 23-37 mins  Zenovia Jarred, MSOT, OTR/L Acute Rehabilitation Services [facility] Office Number: [phone] Pager: [phone]  Zenovia Jarred 03/12/2020, 2:42 PM

## 2020-03-12 NOTE — Addendum Note (Signed)
Addended by: Westley Hummer B on: 03/12/2020 04:50 PM   Modules accepted: Orders

## 2020-03-12 NOTE — Progress Notes (Signed)
Occupational Therapy Treatment Patient Details Name: Brent [name]. MRN: [id number] DOB: 1946-08-03 Today's Date: 03/12/2020    History of present illness Pt adm 6/20 with chest pain and developed RLE weakness in ED. Pt for nuclear stress test on 6/21 where his weakness progressed to BLE's and Bil hands. Neuro consulted and work up in progress. PMH - CAD, DM, peripheral neuropathy, HTN, MI, obesity   OT comments  Pt seen for additional treatment to assist pt in returning to bed and educating RN staff of lift equipment and proper positioning for pt tone. Used maximove with pt in hip and knee flexion to inhibit extensor tone for safety in lift. Pt rolled with max A and "hooking" of forearms with staff for linen change. Pt shows good awareness of needing position change, had requested to get back to bed for skin integrity purposes. D/c recs remain appropriate, will continue to follow.  Follow Up Recommendations  CIR    Equipment Recommendations  Wheelchair (measurements OT);Wheelchair cushion (measurements OT);Hospital bed    Recommendations for Other Services Rehab consult    Precautions / Restrictions Precautions Precautions: Fall Precaution Comments: sensation around T6-9 Restrictions Weight Bearing Restrictions: No       Mobility Bed Mobility Overal bed mobility: Needs Assistance Bed Mobility: Rolling Rolling: Max assist         General bed mobility comments: maxA to roll with pt using elbow to "hook" around OT arm or bed rail to assist in roll. unable to assist with movement of LE.  Transfers Overall transfer level: Needs assistance Equipment used: Ambulation equipment used             General transfer comment: maximove used to assist patient back to bed. BLEs placed in flexion to help break up tone    Balance Overall balance assessment: Needs assistance Sitting-balance support: Feet supported Sitting balance-Leahy Scale: Zero Sitting balance - Comments:  extensor tone limiting                                   ADL either performed or assessed with clinical judgement   ADL Overall ADL's : Needs assistance/impaired Eating/Feeding: Set up;Bed level Eating/Feeding Details (indicate cue type and reason): issued pt U- cuff. Was able to feed self fruit with fork with set up while in chair position in bed.   Grooming Details (indicate cue type and reason): educated pt on using u cuff for brushing teeth, as well as compensating with electric toothbrush                               General ADL Comments: session focused on assisting pt back to bed with lift and educating RN staff     Vision Baseline Vision/History: Wears glasses Wears Glasses: At all times Patient Visual Report: No change from baseline     Perception     Praxis      Cognition Arousal/Alertness: Awake/alert Behavior During Therapy: WFL for tasks assessed/performed Overall Cognitive Status: Within Functional Limits for tasks assessed                                 General Comments: pt agreeable and cooperative through session. Wife present and supportive        Exercises     Shoulder Instructions  General Comments      Pertinent Vitals/ Pain       Pain Assessment: No/denies pain Pain Intervention(s): Limited activity within patient's tolerance;Monitored during session;Repositioned  Home Living                                          Prior Functioning/Environment              Frequency  Min 2X/week        Progress Toward Goals  OT Goals(current goals can now be found in the care plan section)  Progress towards OT goals: Progressing toward goals  Acute Rehab OT Goals Patient Stated Goal: return to independence OT Goal Formulation: With patient Time For Goal Achievement: 03/25/20 Potential to Achieve Goals: Good  Plan Discharge plan remains appropriate    Co-evaluation       Reason for Co-Treatment: Complexity of the patient's impairments (multi-system involvement);To address functional/ADL transfers PT goals addressed during session: Mobility/safety with mobility;Balance        AM-PAC OT "6 Clicks" Daily Activity     Outcome Measure   Help from another person eating meals?: A Little Help from another person taking care of personal grooming?: A Little Help from another person toileting, which includes using toliet, bedpan, or urinal?: Total Help from another person bathing (including washing, rinsing, drying)?: Total Help from another person to put on and taking off regular upper body clothing?: A Lot Help from another person to put on and taking off regular lower body clothing?: Total 6 Click Score: 11    End of Session    OT Visit Diagnosis: Other abnormalities of gait and mobility (R26.89);Muscle weakness (generalized) (M62.81);Other symptoms and signs involving the nervous system (R29.898)   Activity Tolerance Patient tolerated treatment well   Patient Left in bed;with call bell/phone within reach;with bed alarm set;with family/visitor present   Nurse Communication Mobility status;Need for lift equipment        Time: 1445-1505 OT Time Calculation (min): 20 min  Charges: OT General Charges $OT Visit: 1 Visit OT Treatments $Self Care/Home Management : 23-37 mins $Therapeutic Activity: 8-22 mins   Zenovia Jarred, MSOT, OTR/L Acute Rehabilitation Services [facility] Office Number: [phone] Pager: [phone]  Zenovia Jarred 03/12/2020, 5:39 PM

## 2020-03-12 NOTE — Progress Notes (Signed)
NIF > -40, VC 1.6L. Pt had good effort

## 2020-03-12 NOTE — Progress Notes (Signed)
Went into patient's room a second time to see if he was ready to go on CPAP.  Patient was complaining again about his bed.  RN was about to give meds to patient.  I asked RN to call when patient was ready to go on to CPAP.  No distress noted at this time in regards to respiratory.

## 2020-03-13 LAB — VITAMIN B1: Vitamin B1 (Thiamine): 180.9 nmol/L (ref 66.5–200.0)

## 2020-03-13 LAB — CBC WITH DIFFERENTIAL/PLATELET
Abs Immature Granulocytes: 0 10*3/uL (ref 0.00–0.07)
Basophils Absolute: 0 10*3/uL (ref 0.0–0.1)
Basophils Relative: 0 %
Eosinophils Absolute: 0 10*3/uL (ref 0.0–0.5)
Eosinophils Relative: 0 %
HCT: 38.2 % — ABNORMAL LOW (ref 39.0–52.0)
Hemoglobin: 12.5 g/dL — ABNORMAL LOW (ref 13.0–17.0)
Lymphocytes Relative: 13 %
Lymphs Abs: 1.4 10*3/uL (ref 0.7–4.0)
MCH: 29.4 pg (ref 26.0–34.0)
MCHC: 32.7 g/dL (ref 30.0–36.0)
MCV: 89.9 fL (ref 80.0–100.0)
Monocytes Absolute: 0.3 10*3/uL (ref 0.1–1.0)
Monocytes Relative: 3 %
Neutro Abs: 9.2 10*3/uL — ABNORMAL HIGH (ref 1.7–7.7)
Neutrophils Relative %: 84 %
Platelets: 199 10*3/uL (ref 150–400)
RBC: 4.25 MIL/uL (ref 4.22–5.81)
RDW: 13.7 % (ref 11.5–15.5)
WBC: 10.9 10*3/uL — ABNORMAL HIGH (ref 4.0–10.5)
nRBC: 0 % (ref 0.0–0.2)
nRBC: 0 /100 WBC

## 2020-03-13 LAB — IGG CSF INDEX
Albumin CSF-mCnc: 35 mg/dL (ref 15–55)
Albumin: 4.2 g/dL (ref 3.7–4.7)
CSF IgG Index: 0.1 (ref 0.0–0.7)
IgG (Immunoglobin G), Serum: 602 mg/dL — ABNORMAL LOW (ref 603–1613)
IgG, CSF: 0.4 mg/dL (ref 0.0–10.3)
IgG/Alb Ratio, CSF: 0.01 (ref 0.00–0.25)

## 2020-03-13 LAB — BASIC METABOLIC PANEL
Anion gap: 10 (ref 5–15)
BUN: 63 mg/dL — ABNORMAL HIGH (ref 8–23)
CO2: 20 mmol/L — ABNORMAL LOW (ref 22–32)
Calcium: 8.5 mg/dL — ABNORMAL LOW (ref 8.9–10.3)
Chloride: 108 mmol/L (ref 98–111)
Creatinine, Ser: 1.32 mg/dL — ABNORMAL HIGH (ref 0.61–1.24)
GFR calc Af Amer: >60 mL/min (ref >60)
GFR calc non Af Amer: 52 mL/min — ABNORMAL LOW (ref >60)
Glucose, Bld: 114 mg/dL — ABNORMAL HIGH (ref 70–99)
Potassium: 4.6 mmol/L (ref 3.5–5.1)
Sodium: 138 mmol/L (ref 135–145)

## 2020-03-13 LAB — GLUCOSE, CAPILLARY
Glucose-Capillary: 129 mg/dL — ABNORMAL HIGH (ref 70–99)
Glucose-Capillary: 118 mg/dL — ABNORMAL HIGH (ref 70–99)
Glucose-Capillary: 185 mg/dL — ABNORMAL HIGH (ref 70–99)
Glucose-Capillary: 215 mg/dL — ABNORMAL HIGH (ref 70–99)

## 2020-03-13 LAB — OLIGOCLONAL BANDS, CSF + SERM

## 2020-03-13 MED ORDER — SALINE SPRAY 0.65 % NA SOLN
1.0000 | NASAL | Status: DC | PRN
  Administered 2020-03-13 – 2020-03-17 (×2): 1 via NASAL
  Filled 2020-03-13: qty 44

## 2020-03-13 MED ORDER — BISACODYL 10 MG RE SUPP
10.0000 mg | Freq: Once | RECTAL | Status: AC
  Administered 2020-03-13: 10 mg via RECTAL
  Filled 2020-03-13: qty 1

## 2020-03-13 NOTE — Progress Notes (Signed)
Inpatient Rehabilitation-Admissions Coordinator   [facility] continues to follow for completion of medical workup. Will follow up with pt Monday.  Raechel Ache, OTR/L  Rehab Admissions Coordinator  [phone] 03/13/2020 5:19 PM

## 2020-03-13 NOTE — Progress Notes (Signed)
PROGRESS NOTE    Brent [name].  XTK:[id number] DOB: Oct 30, 1945 DOA: 03/08/2020 PCP: [name], [name], MD    Brief Narrative:  74 y.o. male with medical history significant of remote MI stenting x3 in 1990s, CAD, IDDM, DM neuropathy, sciatica with injection treatment 2 years ago, HTN, HLD, came with new onset of chest pain which started when patient was in the church and standing.  Pressure-like 8-9 over 10, associated with feeling of nausea and sweating no vomiting no short of breath no palpitations.  EMS arrived and gave patient some aspirin nitroglycerin sublingual with little improvement.  Then upon arrival in the ED the chest pain slowly progressed to radiate to the left shoulder and his back, then slowly subsided.  While staying in the ED patient started to feel weakness of the whole right-sided leg, he remember that while lying on the stretcher in the ambulance, the stretcher had a bump right beneath his right hip area, where he had " bad inflamed nerve which received injection about 2 years ago in [place]".   He denieD any numbness or loss of feeling but only weakness from hip to toe on the right side.  He denied baseline claudication, he does have chronic back pain on and off.  Patient also admitted he has a baseline diabetic neuropathy with frequent on and off tingling sensation of toes and fingertips. Upon arrival to ED, troponin II sets negative, CTA dissection study negative.  Patient was admitted to hospital service.  Cardiology was consulted.  He underwent nuclear stress test which was unremarkable.  He then started having weakness in left lower extremity as well as decreased grip strength in bilateral hands.  Neurology was consulted.  Spine MRI was ordered which was without any remarkable acute findings.  CT head was unremarkable.  LP was also unremarkable.  His initial working diagnosis was transverse myelitis however the work-up has not been indicative of that.  Patient was  started on high-dose Solu-Medrol 1000 mg IV daily for 5 days starting 03/10/2020.  Assessment & Plan:   Active Problems:   Chest pain   Weakness   Quadriplegia (HCC)   Coronary artery disease involving native coronary artery of native heart without angina pectoris   Steroid-induced hyperglycemia   Diabetic peripheral neuropathy (HCC)   Tachypnea   Hyponatremia   AKI (acute kidney injury) ([place])  Chest pain -Pt has significant prior cardiac history -Given risk factors, Cardiology consulted and pt underwent nuclear stress test on 6/21, reviewed, with no reversible ischemia or infarct. LVEF of 53%, normal LV wall motion. Low risk study -Continue his cardiac meds as tolerated  Progressive BLE weakness, numbness, loss of B hand grip strength  -new findings since initial presentation -Symptoms initially with RLE, since progressing to involve BLE weakness and B grip strength.  -Appreciate assistance by Neurology.  Spine MRI and LP done and arranged by neurology.  He was started on high-dose Solu-Medrol for 5 days with first day starting on 03/10/2020.  The work-up so far has not been indicative of transverse myelitis but this is so far his working diagnosis per neurology and he is getting Solu-Medrol empirically for that.  He has not noted any improvement in his weakness since started on Solu-Medrol.  Per Dr. Lorraine Lax, he will consult with [facility] neurology for further help.  Management deferred to neurology.  Appreciate their help.  IDDM/type 2 diabetes mellitus Blood sugar now much better controlled since started on 30 units of Lantus.  Continue same  regimen.  Essential HTN -Continue home meds -BP stable and controlled at this time  HLD -Continue with statin as tolerated  Obesity -Recommend diet/lifestyle modification  Hx malignant melanoma -chart reviewed. Pt s/p biopsy on 10/19, followed by [facility] -Seems to be stable currently  AKI/dehydration: Came in with normal GFR.  Creatinine  jumped to 1.74.  Now renal function has started to improve.  Creatinine down to 1.32.  Continue IV fluids.  Constipation: No abdominal pain.  No bowel movement in last 5 days.  Passing flatus.  Started on Colace and MiraLAX yesterday with no results.  Dulcolax suppository today.  DVT prophylaxis: Now on SCD, likely due to having LP yesterday.  We will start on Lovenox now that he has been 48 hours out of his LP. Code Status: Full Family Communication: Pt in room, family currently not at bedside  Status is: Inpatient  Remains inpatient appropriate because:Ongoing diagnostic testing needed not appropriate for outpatient work up and Inpatient level of care appropriate due to severity of illness  Dispo: The patient is from: Home              Anticipated d/c is to: CIR              Anticipated d/c date is:1-2 days              Patient currently is not medically stable to d/c.     Consultants:   Cardiology  Neurology  Procedures:   Stress test 6/21  Antimicrobials: Anti-infectives (From admission, onward)   None      Subjective: Patient seen and examined.  He does not feel any improvement in his weakness in bilateral lower extremities and ankle.  He does not have any other complaint.  He is passing gas but has not had any bowel movement last 5 days.  Objective: Vitals:   03/13/20 0115 03/13/20 0600 03/13/20 0907 03/13/20 1200  BP:  (!) 128/59 120/61 120/63  Pulse: 65 63 60 (!) 58  Resp: 19  19 20   Temp:  98.3 F (36.8 C) 97.7 F (36.5 C) 98.1 F (36.7 C)  TempSrc:  Oral Oral Oral  SpO2: 93% 96% 100% 99%  Weight:      Height:        Intake/Output Summary (Last 24 hours) at 03/13/2020 1320 Last data filed at 03/13/2020 0630 Gross per 24 hour  Intake --  Output 3000 ml  Net -3000 ml   Filed Weights   03/08/20 1134 03/08/20 2044 03/12/20 0619  Weight: 101.2 kg 98.4 kg 102.7 kg    Examination:  General exam: Appears calm and comfortable  Respiratory system:  Clear to auscultation. Respiratory effort normal. Cardiovascular system: S1 & S2 heard, RRR. No JVD, murmurs, rubs, gallops or clicks. No pedal edema. Gastrointestinal system: Abdomen is nondistended, soft and nontender. No organomegaly or masses felt. Normal bowel sounds heard. Central nervous system: Alert and oriented.  Power 0/5 in both lower extremities.  Poor handgrip. Skin: No rashes, lesions or ulcers.  Psychiatry: Judgement and insight appear normal. Mood & affect appropriate.    Data Reviewed: I have personally reviewed following labs and imaging studies  CBC: Recent Labs  Lab 03/08/20 1142 03/10/20 0319 03/11/20 0222 03/12/20 0339 03/13/20 0403  WBC 7.2 8.3 9.1 10.9* 10.9*  NEUTROABS 3.8  --   --  7.9* 9.2*  HGB 13.8 13.1 13.4 12.9* 12.5*  HCT 42.2 40.1 40.4 39.2 38.2*  MCV 89.0 89.3 88.0 88.9 89.9  PLT 173 174  183 183 976   Basic Metabolic Panel: Recent Labs  Lab 03/08/20 1142 03/10/20 0319 03/11/20 0222 03/12/20 0339 03/13/20 0403  NA 139 134* 133* 133* 138  K 4.0 3.8 4.3 4.1 4.6  CL 103 102 100 103 108  CO2 24 21* 21* 17* 20*  GLUCOSE 229* 176* 189* 235* 114*  BUN 18 37* 60* 77* 63*  CREATININE 0.96 1.62* 1.74* 1.64* 1.32*  CALCIUM 9.5 9.3 8.9 8.6* 8.5*   GFR: Estimated Creatinine Clearance: 59.9 mL/min (A) (by C-G formula based on SCr of 1.32 mg/dL (H)). Liver Function Tests: Recent Labs  Lab 03/08/20 1142 03/10/20 0319 03/10/20 1757 03/11/20 0222  AST 40 29  --  32  ALT 30 22  --  24  ALKPHOS 71 44  --  45  BILITOT 0.8 0.6  --  0.6  PROT 6.5 6.2*  --  6.3*  ALBUMIN 4.0 3.7 4.2 3.6   Recent Labs  Lab 03/08/20 1142  LIPASE 29   No results for input(s): AMMONIA in the last 168 hours. Coagulation Profile: No results for input(s): INR, PROTIME in the last 168 hours. Cardiac Enzymes: No results for input(s): CKTOTAL, CKMB, CKMBINDEX, TROPONINI in the last 168 hours. BNP (last 3 results) No results for input(s): PROBNP in the last 8760  hours. HbA1C: No results for input(s): HGBA1C in the last 72 hours. CBG: Recent Labs  Lab 03/12/20 1104 03/12/20 1639 03/12/20 2058 03/13/20 0655 03/13/20 1157  GLUCAP 126* 162* 168* 129* 118*   Lipid Profile: No results for input(s): CHOL, HDL, LDLCALC, TRIG, CHOLHDL, LDLDIRECT in the last 72 hours. Thyroid Function Tests: No results for input(s): TSH, T4TOTAL, FREET4, T3FREE, THYROIDAB in the last 72 hours. Anemia Panel: No results for input(s): VITAMINB12, FOLATE, FERRITIN, TIBC, IRON, RETICCTPCT in the last 72 hours. Sepsis Labs: No results for input(s): PROCALCITON, LATICACIDVEN in the last 168 hours.  Recent Results (from the past 240 hour(s))  SARS Coronavirus 2 by RT PCR (hospital order, performed in [facility] hospital lab) Nasopharyngeal Nasopharyngeal Swab     Status: None   Collection Time: 03/08/20 11:33 AM   Specimen: Nasopharyngeal Swab  Result Value Ref Range Status   SARS Coronavirus 2 NEGATIVE NEGATIVE Final    Comment: (NOTE) SARS-CoV-2 target nucleic acids are NOT DETECTED.  The SARS-CoV-2 RNA is generally detectable in upper and lower respiratory specimens during the acute phase of infection. The lowest concentration of SARS-CoV-2 viral copies this assay can detect is 250 copies / mL. A negative result does not preclude SARS-CoV-2 infection and should not be used as the sole basis for treatment or other patient management decisions.  A negative result may occur with improper specimen collection / handling, submission of specimen other than nasopharyngeal swab, presence of viral mutation(s) within the areas targeted by this assay, and inadequate number of viral copies (<250 copies / mL). A negative result must be combined with clinical observations, patient history, and epidemiological information.  Fact Sheet for Patients:   StrictlyIdeas.no  Fact Sheet for Healthcare  Providers: BankingDealers.co.za  This test is not yet approved or  cleared by the [place] FDA and has been authorized for detection and/or diagnosis of SARS-CoV-2 by FDA under an Emergency Use Authorization (EUA).  This EUA will remain in effect (meaning this test can be used) for the duration of the COVID-19 declaration under Section 564(b)(1) of the Act, 21 U.S.C. section 360bbb-3(b)(1), unless the authorization is terminated or revoked sooner.  Performed at [facility],  [address], [place], [place] [address]   Gram stain     Status: None   Collection Time: 03/10/20  5:15 PM   Specimen: PATH Cytology CSF; Cerebrospinal Fluid  Result Value Ref Range Status   Specimen Description CSF  Final   Special Requests NONE  Final   Gram Stain   Final    WBC PRESENT, PREDOMINANTLY PMN NO ORGANISMS SEEN CYTOSPIN SMEAR Performed at [facility], [place] [address], [place], [place] [address]    Report Status 03/10/2020 FINAL  Final     Radiology Studies: MR BRAIN WO CONTRAST  Result Date: 03/11/2020 CLINICAL DATA:  Stroke follow-up EXAM: MRI HEAD WITHOUT CONTRAST TECHNIQUE: Multiplanar, multiecho pulse sequences of the brain and surrounding structures were obtained without intravenous contrast. COMPARISON:  Head CT 03/10/2020 FINDINGS: BRAIN: No acute infarct, acute hemorrhage or extra-axial collection. Normal white matter signal. There is mild generalized atrophy. Unchanged mega cisterna magna. No chronic microhemorrhage. Normal midline structures. VASCULAR: Major flow voids are preserved. SKULL AND UPPER CERVICAL SPINE: Normal calvarium and skull base. Visualized upper cervical spine and soft tissues are normal. SINUSES/ORBITS: No paranasal sinus fluid levels or advanced mucosal thickening. No mastoid or middle ear effusion. Normal orbits. IMPRESSION: 1. No acute intracranial process. 2. Mild generalized atrophy. Electronically Signed   By: Ulyses Jarred  M.D.   On: 03/11/2020 22:51   ECHOCARDIOGRAM COMPLETE BUBBLE STUDY  Result Date: 03/12/2020    ECHOCARDIOGRAM REPORT   Patient Name:   Brent [name]. Date of Exam: 03/12/2020 Medical Rec #:  [id number]           Height:       72.0 in Accession #:    [id number]          Weight:       226.4 lb Date of Birth:  1946/03/18          BSA:          2.246 m Patient Age:    15 years            BP:           117/57 mmHg Patient Gender: M                   HR:           58 bpm. Exam Location:  Inpatient Procedure: 2D Echo and Saline Contrast Bubble Study Indications:    stroke 434.91  History:        Patient has prior history of Echocardiogram examinations, most                 recent 08/18/2017. CAD, Signs/Symptoms:Chest Pain; Risk                 Factors:Hypertension, Dyslipidemia, Diabetes and quadriplegia.  Sonographer:    Johny Chess Referring Phys: [id number] [place] R AROOR IMPRESSIONS  1. Left ventricular ejection fraction, by estimation, is 60 to 65%. The left ventricle has normal function. The left ventricle has no regional wall motion abnormalities. There is mild left ventricular hypertrophy. Left ventricular diastolic parameters were normal.  2. Right ventricular systolic function is normal. The right ventricular size is normal. There is normal pulmonary artery systolic pressure. The estimated right ventricular systolic pressure is 95.1 mmHg.  3. The mitral valve is normal in structure. No evidence of mitral valve regurgitation.  4. The aortic valve is tricuspid. Aortic valve regurgitation is not visualized. No aortic stenosis is present.  5.  The inferior vena cava is dilated in size with <50% respiratory variability, suggesting right atrial pressure of 15 mmHg.  6. Agitated saline contrast bubble study was negative, with no evidence of any interatrial shunt, though technically difficult study FINDINGS  Left Ventricle: Left ventricular ejection fraction, by estimation, is 60 to 65%. The left ventricle  has normal function. The left ventricle has no regional wall motion abnormalities. The left ventricular internal cavity size was normal in size. There is  mild left ventricular hypertrophy. Left ventricular diastolic parameters were normal. Right Ventricle: The right ventricular size is normal. Right vetricular wall thickness was not assessed. Right ventricular systolic function is normal. There is normal pulmonary artery systolic pressure. The tricuspid regurgitant velocity is 2.06 m/s, and with an assumed right atrial pressure of 15 mmHg, the estimated right ventricular systolic pressure is 02.5 mmHg. Left Atrium: Left atrial size was normal in size. Right Atrium: Right atrial size was normal in size. Pericardium: There is no evidence of pericardial effusion. Mitral Valve: The mitral valve is normal in structure. No evidence of mitral valve regurgitation. Tricuspid Valve: The tricuspid valve is normal in structure. Tricuspid valve regurgitation is trivial. Aortic Valve: The aortic valve is tricuspid. Aortic valve regurgitation is not visualized. No aortic stenosis is present. Pulmonic Valve: The pulmonic valve was not well visualized. Pulmonic valve regurgitation is not visualized. Aorta: The aortic root and ascending aorta are structurally normal, with no evidence of dilitation. Venous: The inferior vena cava is dilated in size with less than 50% respiratory variability, suggesting right atrial pressure of 15 mmHg. IAS/Shunts: No atrial level shunt detected by color flow Doppler. Agitated saline contrast was given intravenously to evaluate for intracardiac shunting. Agitated saline contrast bubble study was negative, with no evidence of any interatrial shunt.  LEFT VENTRICLE PLAX 2D LVIDd:         5.10 cm  Diastology LVIDs:         3.30 cm  LV e' lateral:   10.80 cm/s LV PW:         0.90 cm  LV E/e' lateral: 7.1 LV IVS:        1.10 cm  LV e' medial:    9.36 cm/s LVOT diam:     2.00 cm  LV E/e' medial:  8.2 LV SV:          76 LV SV Index:   34 LVOT Area:     3.14 cm  RIGHT VENTRICLE             IVC RV S prime:     10.80 cm/s  IVC diam: 2.50 cm TAPSE (M-mode): 2.6 cm LEFT ATRIUM             Index       RIGHT ATRIUM           Index LA diam:        3.70 cm 1.65 cm/m  RA Area:     19.80 cm LA Vol (A2C):   58.1 ml 25.87 ml/m RA Volume:   58.50 ml  26.05 ml/m LA Vol (A4C):   61.2 ml 27.25 ml/m LA Biplane Vol: 62.4 ml 27.78 ml/m  AORTIC VALVE LVOT Vmax:   92.00 cm/s LVOT Vmean:  65.600 cm/s LVOT VTI:    0.241 m  AORTA Ao Root diam: 3.20 cm Ao Asc diam:  3.40 cm MITRAL VALVE               TRICUSPID VALVE MV Area (PHT): 5.38  cm    TR Peak grad:   17.0 mmHg MV Decel Time: 141 msec    TR Vmax:        206.00 cm/s MV E velocity: 77.10 cm/s MV A velocity: 58.30 cm/s  SHUNTS MV E/A ratio:  1.32        Systemic VTI:  0.24 m                            Systemic Diam: 2.00 cm Oswaldo Milian MD Electronically signed by Oswaldo Milian MD Signature Date/Time: 03/12/2020/5:44:29 PM    Final    VAS US CAROTID  Result Date: 03/12/2020 Carotid Arterial Duplex Study Indications:       Numbness and Weakness. Risk Factors:      Hypertension, hyperlipidemia, Diabetes, prior MI, coronary                    artery disease. Comparison Study:  No prior studies. Performing Technologist: Darlin Coco  Examination Guidelines: A complete evaluation includes B-mode imaging, spectral Doppler, color Doppler, and power Doppler as needed of all accessible portions of each vessel. Bilateral testing is considered an integral part of a complete examination. Limited examinations for reoccurring indications may be performed as noted.  Right Carotid Findings: +----------+--------+--------+--------+---------------------+------------------+           PSV cm/sEDV cm/sStenosisPlaque Description   Comments           +----------+--------+--------+--------+---------------------+------------------+ CCA Prox  70      11              heterogenous and      intimal thickening                                   focal                                   +----------+--------+--------+--------+---------------------+------------------+ CCA Distal69      11                                   intimal thickening +----------+--------+--------+--------+---------------------+------------------+ ICA Prox  78      9       1-39%   calcific                                +----------+--------+--------+--------+---------------------+------------------+ ICA Distal63      11                                                      +----------+--------+--------+--------+---------------------+------------------+ ECA       110                                                             +----------+--------+--------+--------+---------------------+------------------+ +----------+--------+-------+----------------+-------------------+           PSV cm/sEDV cmsDescribe  Arm Pressure (mmHG) +----------+--------+-------+----------------+-------------------+ [id number]            Multiphasic, WNL                    +----------+--------+-------+----------------+-------------------+ +---------+--------+--+--------+--+---------+ VertebralPSV cm/s42EDV cm/s11Antegrade +---------+--------+--+--------+--+---------+  Left Carotid Findings: +----------+--------+--------+--------+------------------+------------------+           PSV cm/sEDV cm/sStenosisPlaque DescriptionComments           +----------+--------+--------+--------+------------------+------------------+ CCA Prox  70      12              focal and smooth  intimal thickening +----------+--------+--------+--------+------------------+------------------+ CCA Distal67      10                                intimal thickening +----------+--------+--------+--------+------------------+------------------+ ICA Prox  56      10      1-39%   calcific                              +----------+--------+--------+--------+------------------+------------------+ ICA Distal56      11                                                   +----------+--------+--------+--------+------------------+------------------+ +----------+--------+--------+----------------+-------------------+           PSV cm/sEDV cm/sDescribe        Arm Pressure (mmHG) +----------+--------+--------+----------------+-------------------+ [id number]              Multiphasic, WNL                    +----------+--------+--------+----------------+-------------------+ +---------+--------+--+--------+--+---------+ VertebralPSV cm/s59EDV cm/s12Antegrade +---------+--------+--+--------+--+---------+   Summary: Right Carotid: Velocities in the right ICA are consistent with a 1-39% stenosis.                Non-hemodynamically significant plaque <50% noted in the CCA. The                extracranial vessels were near-normal with only minimal wall                thickening or plaque. Left Carotid: Velocities in the left ICA are consistent with a 1-39% stenosis.               Non-hemodynamically significant plaque <50% noted in the CCA. The               extracranial vessels were near-normal with only minimal wall               thickening or plaque. Vertebrals:  Bilateral vertebral arteries demonstrate antegrade flow. Subclavians: Normal flow hemodynamics were seen in bilateral subclavian              arteries. *See table(s) above for measurements and observations.  Electronically signed by Servando Snare MD on 03/12/2020 at 9:01:50 PM.    Final     Scheduled Meds: . aspirin EC  81 mg Oral QHS  . docusate sodium  100 mg Oral BID  . enoxaparin (LOVENOX) injection  50 mg Subcutaneous Q24H  . fenofibrate  160 mg Oral QHS  . fluticasone  2 spray Each Nare QHS  . icosapent Ethyl  2 g Oral BID  . insulin aspart  0-5 Units Subcutaneous QHS  . insulin  aspart  0-9 Units Subcutaneous TID WC  . insulin aspart  25  Units Subcutaneous TID AC  . insulin glargine  30 Units Subcutaneous Daily  . insulin glargine  60 Units Subcutaneous QHS  . lidocaine  1 patch Transdermal Q24H  . multivitamin with minerals  1 tablet Oral Daily  . nitroGLYCERIN  1 inch Topical Q6H  . oxybutynin  5 mg Oral BID  . pantoprazole  40 mg Oral Daily  . polyethylene glycol  17 g Oral Daily  . rosuvastatin  40 mg Oral QHS  . sertraline  150 mg Oral QHS  . vitamin B-12  500 mcg Oral Daily  . zolpidem  10 mg Oral QHS   Continuous Infusions: . sodium chloride Stopped (03/10/20 2249)  . sodium chloride 125 mL/hr at 03/12/20 2330  . methylPREDNISolone (SOLU-MEDROL) injection 1,000 mg (03/13/20 1014)     LOS: 4 days   Darliss Cheney, MD [facility] Pager On Amion  If 7PM-7AM, please contact night-coverage 03/13/2020, 1:20 PM

## 2020-03-13 NOTE — Progress Notes (Addendum)
Reason for consult:   Subjective: No significant change in exam.  Patient subjectively feels worse.   ROS: negative except above  Examination  Vital signs in last 24 hours: Temp:  [97.7 F (36.5 C)-98.6 F (37 C)] 98.6 F (37 C) (06/25 1700) Pulse Rate:  [58-68] 62 (06/25 1700) Resp:  [17-20] 20 (06/25 1700) BP: (120-135)/(53-67) 125/53 (06/25 1700) SpO2:  [93 %-100 %] 94 % (06/25 1700)  General: lying in bed** CVS: pulse-normal rate and rhythm RS: breathing comfortably Extremities: normal   Neuro: HK:VQQVZ oriented CN: No ptosis ,ocular movements intact, visual fields full, no facial droop RUE: 4+/5 proximal muscles, wrist drop LUE: 4+/5 proximal muscles, wrist drop. Lower extremities: Flaccid paralysis bilaterally. Reflexes: Brisk patellar reflexes Coordination:No gross ataxia noted Gait:unable to walk  Basic Metabolic Panel: Recent Labs  Lab 03/08/20 1142 03/08/20 1142 03/10/20 0319 03/10/20 0319 03/11/20 0222 03/12/20 0339 03/13/20 0403  NA 139  --  134*  --  133* 133* 138  K 4.0  --  3.8  --  4.3 4.1 4.6  CL 103  --  102  --  100 103 108  CO2 24  --  21*  --  21* 17* 20*  GLUCOSE 229*  --  176*  --  189* 235* 114*  BUN 18  --  37*  --  60* 77* 63*  CREATININE 0.96  --  1.62*  --  1.74* 1.64* 1.32*  CALCIUM 9.5   < > 9.3   < > 8.9 8.6* 8.5*   < > = values in this interval not displayed.    CBC: Recent Labs  Lab 03/08/20 1142 03/10/20 0319 03/11/20 0222 03/12/20 0339 03/13/20 0403  WBC 7.2 8.3 9.1 10.9* 10.9*  NEUTROABS 3.8  --   --  7.9* 9.2*  HGB 13.8 13.1 13.4 12.9* 12.5*  HCT 42.2 40.1 40.4 39.2 38.2*  MCV 89.0 89.3 88.0 88.9 89.9  PLT 173 174 183 183 199     Coagulation Studies: No results for input(s): LABPROT, INR in the last 72 hours.  Imaging Reviewed:     ASSESSMENT AND PLAN  74 year old male with past medical history significant for coronary artery disease, diabetes mellitus, hypertension, hyperlipidemia, obstructive  sleep apnea, obesity presents to the emergency department with chest pain, diaphoresis as well as sharp back pain-CTA ruled out dissection.  Subsequently had progressive weakness,starting with right leg and left leg and then bilateral distal upper extremity weakness. Sensory level T6-T9.  Stat MRI C, T, L spineperformed which was reported as negative yesterday:however,on my reviewshows subtlesignal change or cervical,thoracic spine.I discussed with neuro radswhoalso agree there is subtle signal abnormality in thoracic spine.  Edited report: "Examination discussed with Dr. Macy Mis, who had previously discussed with Dr. Karena Addison Marrian Bells. On further review, I agree there is abnormal T2-weighted signal intensity in the upper thoracic spinal cord. There is no spinal cord expansion. There are no abnormal flow voids along the surface of the spinal cord. This could indicate a subacute to chronic spinal cord infarction. Demyelinating disease or transverse myelitis are also possibilities. The patient is undergoing a contrast-enhanced cervical and thoracic spine MRI. MRI C-spine and T-spine with contrast did not show any contrast-enhancement"  Underwent LP under fluoroscopy: CSF cell count 1 Protein 53 IgG index still pending  Other labs include B12 714 Ceruloplasmin normal Folate normal A1c 6.7 Pending:  NMO SSA,SSB ACE ANA, RF B1 Serum paraneoplastic panel Serum MOG antibody   Impression:  Acute myelopathywith signal change in upper  thoracic spinal cord with no contrast-enhancement -Differential includes spinal cord infarct versus longitudinal extensive transverse myelitis  Recommendations -IVSolumedrol1 gx 5 days, now 4/5 being given empirically. -Aggressive PT OT -Consider aspirin 81 mg daily and atorvastatin20 mg daily -Protonix 40 mg daily -NIFevery 12 hours -Frequent neurochecks -MRI brain negative for acute infarct/demyelination.   Echocardiogram completed : No PFO, EF 60 to 65%.  Telemetry so far does not show A. Fib. Outpatient loop monitor. - can consider repeat MRI C and T spine on Monday to see if lesion is stable or worse  Discussed case with neuro-immunologist from [facility] for transfer.  He reviewed charts/notes and imaging and agrees that there is a signal change in the thoracic cord. In the absence of contrast enhancement and that patient is not progressively getting worse clinically, does not feel there is any role for IVIG/plasmapheresis.  Also feels spinal cord infarct may still be possibility despite progression over 12- 16 hours.  Declined transfer as he felt there was no additional testing or treatment that he could offer.  Recommend to go ahead and get paraneoplastic panel which have ordered.   CRITICAL CARE Performed by: Lanice Schwab Merritt Mccravy   Total critical care time: 60 minutes  Critical care time was exclusive of separately billable procedures and treating other patients.  Critical care was necessary to treat or prevent imminent or life-threatening deterioration.  Critical care was time spent personally by me on the following activities: development of treatment plan with patient and/or surrogate as well as nursing, discussions with consultants, evaluation of patient's response to treatment, examination of patient, obtaining history from patient or surrogate, ordering and performing treatments and interventions, ordering and review of laboratory studies, ordering and review of radiographic studies, pulse oximetry and re-evaluation of patient's condition.      Karena Addison Endrit Gittins [facility] Pager Number [phone] For questions after 7pm please refer to AMION to reach the Neurologist on call

## 2020-03-13 NOTE — Progress Notes (Signed)
VC 1.6L and NIF -40. Good effort from patient.

## 2020-03-13 NOTE — Progress Notes (Signed)
Assisted pt with placing CPAP mask on. Pt tolerated well, no further needs at this time. RT will continue to monitor and be available if needed.

## 2020-03-14 LAB — CBC WITH DIFFERENTIAL/PLATELET
Abs Immature Granulocytes: 0.08 10*3/uL — ABNORMAL HIGH (ref 0.00–0.07)
Basophils Absolute: 0 10*3/uL (ref 0.0–0.1)
Basophils Relative: 0 %
Eosinophils Absolute: 0 10*3/uL (ref 0.0–0.5)
Eosinophils Relative: 0 %
HCT: 39.6 % (ref 39.0–52.0)
Hemoglobin: 12.6 g/dL — ABNORMAL LOW (ref 13.0–17.0)
Immature Granulocytes: 1 %
Lymphocytes Relative: 25 %
Lymphs Abs: 2.8 10*3/uL (ref 0.7–4.0)
MCH: 28.9 pg (ref 26.0–34.0)
MCHC: 31.8 g/dL (ref 30.0–36.0)
MCV: 90.8 fL (ref 80.0–100.0)
Monocytes Absolute: 0.6 10*3/uL (ref 0.1–1.0)
Monocytes Relative: 5 %
Neutro Abs: 7.9 10*3/uL — ABNORMAL HIGH (ref 1.7–7.7)
Neutrophils Relative %: 69 %
Platelets: 188 10*3/uL (ref 150–400)
RBC: 4.36 MIL/uL (ref 4.22–5.81)
RDW: 13.6 % (ref 11.5–15.5)
WBC: 11.3 10*3/uL — ABNORMAL HIGH (ref 4.0–10.5)
nRBC: 0 % (ref 0.0–0.2)

## 2020-03-14 LAB — GLUCOSE, CAPILLARY
Glucose-Capillary: 76 mg/dL (ref 70–99)
Glucose-Capillary: 124 mg/dL — ABNORMAL HIGH (ref 70–99)
Glucose-Capillary: 156 mg/dL — ABNORMAL HIGH (ref 70–99)
Glucose-Capillary: 258 mg/dL — ABNORMAL HIGH (ref 70–99)

## 2020-03-14 LAB — BASIC METABOLIC PANEL
Anion gap: 9 (ref 5–15)
BUN: 46 mg/dL — ABNORMAL HIGH (ref 8–23)
CO2: 20 mmol/L — ABNORMAL LOW (ref 22–32)
Calcium: 8.4 mg/dL — ABNORMAL LOW (ref 8.9–10.3)
Chloride: 110 mmol/L (ref 98–111)
Creatinine, Ser: 1.06 mg/dL (ref 0.61–1.24)
GFR calc Af Amer: >60 mL/min (ref >60)
GFR calc non Af Amer: >60 mL/min (ref >60)
Glucose, Bld: 98 mg/dL (ref 70–99)
Potassium: 4.8 mmol/L (ref 3.5–5.1)
Sodium: 139 mmol/L (ref 135–145)

## 2020-03-14 LAB — ANGIOTENSIN CONVERTING ENZYME: Angiotensin-Converting Enzyme: 19 U/L (ref 14–82)

## 2020-03-14 LAB — RHEUMATOID FACTOR: Rheumatoid fact SerPl-aCnc: 19.1 IU/mL — ABNORMAL HIGH (ref 0.0–13.9)

## 2020-03-14 NOTE — Progress Notes (Signed)
Patient refusing CPAP tonight. Does not like mask compared to his at home. Will call if he changes his mind and would like to go on later.

## 2020-03-14 NOTE — Progress Notes (Signed)
NIF -40 VC 1.3L  Both with great effort

## 2020-03-14 NOTE — Progress Notes (Signed)
NIF > -40, VC 1.4L with excellent effort.

## 2020-03-14 NOTE — Progress Notes (Addendum)
PROGRESS NOTE    Brent [name].  HYQ:[id number] DOB: Jan 18, 1946 DOA: 03/08/2020 PCP: [name], [name], MD    Brief Narrative:  74 y.o. male with medical history significant of remote MI stenting x3 in 1990s, CAD, IDDM, DM neuropathy, sciatica with injection treatment 2 years ago, HTN, HLD, came with new onset of chest pain which started when patient was in the church and standing.  Pressure-like 8-9 over 10, associated with feeling of nausea and sweating no vomiting no short of breath no palpitations.  EMS arrived and gave patient some aspirin nitroglycerin sublingual with little improvement.  Then upon arrival in the ED the chest pain slowly progressed to radiate to the left shoulder and his back, then slowly subsided.  While staying in the ED patient started to feel weakness of the whole right-sided leg, he remember that while lying on the stretcher in the ambulance, the stretcher had a bump right beneath his right hip area, where he had " bad inflamed nerve which received injection about 2 years ago in [place]".   He denieD any numbness or loss of feeling but only weakness from hip to toe on the right side.  He denied baseline claudication, he does have chronic back pain on and off.  Patient also admitted he has a baseline diabetic neuropathy with frequent on and off tingling sensation of toes and fingertips. Upon arrival to ED, troponin II sets negative, CTA dissection study negative.  Patient was admitted to hospital service.  Cardiology was consulted.  He underwent nuclear stress test which was unremarkable.  He then started having weakness in left lower extremity as well as decreased grip strength in bilateral hands.  Neurology was consulted. C Spine MRI was ordered which was without any remarkable acute findings per official radiology report however per neurology MRI spine shows subtle signal change on cervical, thoracic spine and he had discussed with neuroradiology as well.  T-spine MRI  showed 6 x 10 mm enhancing lesion left T9 vertebral body, low signal on T1 and high signal on T2.  CT head was unremarkable.  LP was also unremarkable.  His initial working diagnosis was transverse myelitis however the work-up has not been indicative of that.  Patient was started on high-dose Solu-Medrol 1000 mg IV daily for 5 days starting 03/10/2020.  Assessment & Plan:   Active Problems:   Chest pain   Weakness   Quadriplegia (HCC)   Coronary artery disease involving native coronary artery of native heart without angina pectoris   Steroid-induced hyperglycemia   Diabetic peripheral neuropathy (HCC)   Tachypnea   Hyponatremia   AKI (acute kidney injury) ([place])  Chest pain -Pt has significant prior cardiac history -Given risk factors, Cardiology consulted and pt underwent nuclear stress test on 6/21, reviewed, with no reversible ischemia or infarct. LVEF of 53%, normal LV wall motion. Low risk study -Continue his cardiac meds as tolerated  Progressive BLE weakness, numbness, loss of B hand grip strength  -new findings since initial presentation -Symptoms initially with RLE, since progressing to involve BLE weakness and B grip strength.  -Appreciate assistance by Neurology.  C Spine MRI was ordered which was without any remarkable acute findings per official radiology report however per neurology MRI spine shows subtle signal change on cervical, thoracic spine and he had discussed with neuroradiology as well.  T-spine MRI showed 6 x 10 mm enhancing lesion left T9 vertebral body, low signal on T1 and high signal on T2.  CT head was  unremarkable.  LP was also unremarkable.  He was started on high-dose Solu-Medrol for 5 days with first day starting on 03/10/2020.  The work-up so far has not been indicative of transverse myelitis but this along with possible spinal infarct is so far his working diagnosis per neurology and he is getting Solu-Medrol empirically for that.  He has not noted any  improvement in his weakness since started on Solu-Medrol.  He is going to receive his last dose today.Dr. Lorraine Lax discussed case with [place] neuro immunologist on 03/15/2020 who had declined transfer and opined that there is nothing further that they have to offer and agreed with the treatment that patient is receiving here.  Due to consideration of possible infarct, I discussed with Dr. Lorraine Lax about possibility of arranging loop recorder for this patient.  He agrees with that plan.  We have not seen any atrial fibrillation on telemetry here.  We will touch base with cardiology tomorrow to possibly arrange this for him on Monday before discharging to CIR if cleared by neurology.  IDDM/type 2 diabetes mellitus Blood sugar now much better controlled since started on 30 units of Lantus.  Continue same regimen.  Essential HTN -Continue home meds -BP stable and controlled at this time  HLD -Continue with statin as tolerated  Obesity -Recommend diet/lifestyle modification  Hx malignant melanoma -chart reviewed. Pt s/p biopsy on 10/19, followed by [facility] -Seems to be stable currently  AKI/dehydration: Came in with normal GFR.  Creatinine jumped to 1.74.  Now renal function has started to improve.  Creatinine down to 1.32.  Continue IV fluids.  Constipation: Patient had one bowel movement yesterday and small one this morning but he still feels that he has more to go.  Will continue daily MiraLAX and Colace and will provide with 1 more dose of Dulcolax suppository today.  DVT prophylaxis: Place and maintain sequential compression device Start: 03/09/20 1849 And Lovenox Code Status: Full Family Communication: Pt in room, family currently not at bedside  Status is: Inpatient  Remains inpatient appropriate because:Ongoing diagnostic testing needed not appropriate for outpatient work up and Inpatient level of care appropriate due to severity of illness  Dispo: The patient is from: Home               Anticipated d/c is to: CIR              Anticipated d/c date is:1-2 days              Patient currently is not medically stable to d/c.     Consultants:   Cardiology  Neurology  Procedures:   Stress test 6/21  Antimicrobials: Anti-infectives (From admission, onward)   None      Subjective: Seen and examined.  No difference or any progress since yesterday.  Continues to have bilateral lower extremity weakness and weak handgrip bilaterally.  No new complaint.  Objective: Vitals:   03/13/20 2300 03/13/20 2345 03/14/20 0431 03/14/20 0752  BP:  (!) 146/68 134/61 126/64  Pulse: 72 70 61 66  Resp: 18 20 20 18   Temp:  98.6 F (37 C) 98.3 F (36.8 C) 97.8 F (36.6 C)  TempSrc:  Oral Oral Oral  SpO2: 95% 94% 93% 93%  Weight:      Height:        Intake/Output Summary (Last 24 hours) at 03/14/2020 1117 Last data filed at 03/14/2020 0835 Gross per 24 hour  Intake 120 ml  Output 2850 ml  Net -2730 ml  Filed Weights   03/08/20 1134 03/08/20 2044 03/12/20 0619  Weight: 101.2 kg 98.4 kg 102.7 kg    Examination:  General exam: Appears calm and comfortable  Respiratory system: Clear to auscultation. Respiratory effort normal. Cardiovascular system: S1 & S2 heard, RRR. No JVD, murmurs, rubs, gallops or clicks. No pedal edema. Gastrointestinal system: Abdomen is nondistended, soft and nontender. No organomegaly or masses felt. Normal bowel sounds heard. Central nervous system: Alert and oriented.  0/5 power bilateral lower extremity.  Poor handgrip. Skin: No rashes, lesions or ulcers.  Psychiatry: Judgement and insight appear normal. Mood & affect appropriate.    Data Reviewed: I have personally reviewed following labs and imaging studies  CBC: Recent Labs  Lab 03/08/20 1142 03/08/20 1142 03/10/20 0319 03/11/20 0222 03/12/20 0339 03/13/20 0403 03/14/20 0406  WBC 7.2   < > 8.3 9.1 10.9* 10.9* 11.3*  NEUTROABS 3.8  --   --   --  7.9* 9.2* 7.9*  HGB 13.8   < >  13.1 13.4 12.9* 12.5* 12.6*  HCT 42.2   < > 40.1 40.4 39.2 38.2* 39.6  MCV 89.0   < > 89.3 88.0 88.9 89.9 90.8  PLT 173   < > 174 183 183 199 188   < > = values in this interval not displayed.   Basic Metabolic Panel: Recent Labs  Lab 03/10/20 0319 03/11/20 0222 03/12/20 0339 03/13/20 0403 03/14/20 0406  NA 134* 133* 133* 138 139  K 3.8 4.3 4.1 4.6 4.8  CL 102 100 103 108 110  CO2 21* 21* 17* 20* 20*  GLUCOSE 176* 189* 235* 114* 98  BUN 37* 60* 77* 63* 46*  CREATININE 1.62* 1.74* 1.64* 1.32* 1.06  CALCIUM 9.3 8.9 8.6* 8.5* 8.4*   GFR: Estimated Creatinine Clearance: 74.6 mL/min (by C-G formula based on SCr of 1.06 mg/dL). Liver Function Tests: Recent Labs  Lab 03/08/20 1142 03/10/20 0319 03/10/20 1757 03/11/20 0222  AST 40 29  --  32  ALT 30 22  --  24  ALKPHOS 71 44  --  45  BILITOT 0.8 0.6  --  0.6  PROT 6.5 6.2*  --  6.3*  ALBUMIN 4.0 3.7 4.2 3.6   Recent Labs  Lab 03/08/20 1142  LIPASE 29   No results for input(s): AMMONIA in the last 168 hours. Coagulation Profile: No results for input(s): INR, PROTIME in the last 168 hours. Cardiac Enzymes: No results for input(s): CKTOTAL, CKMB, CKMBINDEX, TROPONINI in the last 168 hours. BNP (last 3 results) No results for input(s): PROBNP in the last 8760 hours. HbA1C: No results for input(s): HGBA1C in the last 72 hours. CBG: Recent Labs  Lab 03/13/20 0655 03/13/20 1157 03/13/20 1705 03/13/20 2057 03/14/20 0631  GLUCAP 129* 118* 185* 215* 76   Lipid Profile: No results for input(s): CHOL, HDL, LDLCALC, TRIG, CHOLHDL, LDLDIRECT in the last 72 hours. Thyroid Function Tests: No results for input(s): TSH, T4TOTAL, FREET4, T3FREE, THYROIDAB in the last 72 hours. Anemia Panel: No results for input(s): VITAMINB12, FOLATE, FERRITIN, TIBC, IRON, RETICCTPCT in the last 72 hours. Sepsis Labs: No results for input(s): PROCALCITON, LATICACIDVEN in the last 168 hours.  Recent Results (from the past 240 hour(s))    SARS Coronavirus 2 by RT PCR (hospital order, performed in [facility] hospital lab) Nasopharyngeal Nasopharyngeal Swab     Status: None   Collection Time: 03/08/20 11:33 AM   Specimen: Nasopharyngeal Swab  Result Value Ref Range Status   SARS Coronavirus 2 NEGATIVE  NEGATIVE Final    Comment: (NOTE) SARS-CoV-2 target nucleic acids are NOT DETECTED.  The SARS-CoV-2 RNA is generally detectable in upper and lower respiratory specimens during the acute phase of infection. The lowest concentration of SARS-CoV-2 viral copies this assay can detect is 250 copies / mL. A negative result does not preclude SARS-CoV-2 infection and should not be used as the sole basis for treatment or other patient management decisions.  A negative result may occur with improper specimen collection / handling, submission of specimen other than nasopharyngeal swab, presence of viral mutation(s) within the areas targeted by this assay, and inadequate number of viral copies (<250 copies / mL). A negative result must be combined with clinical observations, patient history, and epidemiological information.  Fact Sheet for Patients:   StrictlyIdeas.no  Fact Sheet for Healthcare Providers: BankingDealers.co.za  This test is not yet approved or  cleared by the [place] FDA and has been authorized for detection and/or diagnosis of SARS-CoV-2 by FDA under an Emergency Use Authorization (EUA).  This EUA will remain in effect (meaning this test can be used) for the duration of the COVID-19 declaration under Section 564(b)(1) of the Act, 21 U.S.C. section 360bbb-3(b)(1), unless the authorization is terminated or revoked sooner.  Performed at [facility], [place] [address], [place], [place] [address]   Gram stain     Status: None   Collection Time: 03/10/20  5:15 PM   Specimen: PATH Cytology CSF; Cerebrospinal Fluid  Result Value Ref Range Status   Specimen  Description CSF  Final   Special Requests NONE  Final   Gram Stain   Final    WBC PRESENT, PREDOMINANTLY PMN NO ORGANISMS SEEN CYTOSPIN SMEAR Performed at [facility], [place] [address] [place], [place] [address]    Report Status 03/10/2020 FINAL  Final     Radiology Studies: ECHOCARDIOGRAM COMPLETE BUBBLE STUDY  Result Date: 03/12/2020    ECHOCARDIOGRAM REPORT   Patient Name:   Brent [name]. Date of Exam: 03/12/2020 Medical Rec #:  [id number]           Height:       72.0 in Accession #:    [id number]          Weight:       226.4 lb Date of Birth:  [birth year]          BSA:          2.246 m Patient Age:    25 years            BP:           117/57 mmHg Patient Gender: M                   HR:           58 bpm. Exam Location:  Inpatient Procedure: 2D Echo and Saline Contrast Bubble Study Indications:    stroke 434.91  History:        Patient has prior history of Echocardiogram examinations, most                 recent 08/18/2017. CAD, Signs/Symptoms:Chest Pain; Risk                 Factors:Hypertension, Dyslipidemia, Diabetes and quadriplegia.  Sonographer:    Johny Chess Referring Phys: [id number] [place] R AROOR IMPRESSIONS  1. Left ventricular ejection fraction, by estimation, is 60 to 65%. The left ventricle has normal function. The left ventricle  has no regional wall motion abnormalities. There is mild left ventricular hypertrophy. Left ventricular diastolic parameters were normal.  2. Right ventricular systolic function is normal. The right ventricular size is normal. There is normal pulmonary artery systolic pressure. The estimated right ventricular systolic pressure is 84.6 mmHg.  3. The mitral valve is normal in structure. No evidence of mitral valve regurgitation.  4. The aortic valve is tricuspid. Aortic valve regurgitation is not visualized. No aortic stenosis is present.  5. The inferior vena cava is dilated in size with <50% respiratory variability, suggesting right atrial  pressure of 15 mmHg.  6. Agitated saline contrast bubble study was negative, with no evidence of any interatrial shunt, though technically difficult study FINDINGS  Left Ventricle: Left ventricular ejection fraction, by estimation, is 60 to 65%. The left ventricle has normal function. The left ventricle has no regional wall motion abnormalities. The left ventricular internal cavity size was normal in size. There is  mild left ventricular hypertrophy. Left ventricular diastolic parameters were normal. Right Ventricle: The right ventricular size is normal. Right vetricular wall thickness was not assessed. Right ventricular systolic function is normal. There is normal pulmonary artery systolic pressure. The tricuspid regurgitant velocity is 2.06 m/s, and with an assumed right atrial pressure of 15 mmHg, the estimated right ventricular systolic pressure is 65.9 mmHg. Left Atrium: Left atrial size was normal in size. Right Atrium: Right atrial size was normal in size. Pericardium: There is no evidence of pericardial effusion. Mitral Valve: The mitral valve is normal in structure. No evidence of mitral valve regurgitation. Tricuspid Valve: The tricuspid valve is normal in structure. Tricuspid valve regurgitation is trivial. Aortic Valve: The aortic valve is tricuspid. Aortic valve regurgitation is not visualized. No aortic stenosis is present. Pulmonic Valve: The pulmonic valve was not well visualized. Pulmonic valve regurgitation is not visualized. Aorta: The aortic root and ascending aorta are structurally normal, with no evidence of dilitation. Venous: The inferior vena cava is dilated in size with less than 50% respiratory variability, suggesting right atrial pressure of 15 mmHg. IAS/Shunts: No atrial level shunt detected by color flow Doppler. Agitated saline contrast was given intravenously to evaluate for intracardiac shunting. Agitated saline contrast bubble study was negative, with no evidence of any interatrial  shunt.  LEFT VENTRICLE PLAX 2D LVIDd:         5.10 cm  Diastology LVIDs:         3.30 cm  LV e' lateral:   10.80 cm/s LV PW:         0.90 cm  LV E/e' lateral: 7.1 LV IVS:        1.10 cm  LV e' medial:    9.36 cm/s LVOT diam:     2.00 cm  LV E/e' medial:  8.2 LV SV:         76 LV SV Index:   34 LVOT Area:     3.14 cm  RIGHT VENTRICLE             IVC RV S prime:     10.80 cm/s  IVC diam: 2.50 cm TAPSE (M-mode): 2.6 cm LEFT ATRIUM             Index       RIGHT ATRIUM           Index LA diam:        3.70 cm 1.65 cm/m  RA Area:     19.80 cm LA Vol (A2C):   58.1 ml  25.87 ml/m RA Volume:   58.50 ml  26.05 ml/m LA Vol (A4C):   61.2 ml 27.25 ml/m LA Biplane Vol: 62.4 ml 27.78 ml/m  AORTIC VALVE LVOT Vmax:   92.00 cm/s LVOT Vmean:  65.600 cm/s LVOT VTI:    0.241 m  AORTA Ao Root diam: 3.20 cm Ao Asc diam:  3.40 cm MITRAL VALVE               TRICUSPID VALVE MV Area (PHT): 5.38 cm    TR Peak grad:   17.0 mmHg MV Decel Time: 141 msec    TR Vmax:        206.00 cm/s MV E velocity: 77.10 cm/s MV A velocity: 58.30 cm/s  SHUNTS MV E/A ratio:  1.32        Systemic VTI:  0.24 m                            Systemic Diam: 2.00 cm Oswaldo Milian MD Electronically signed by Oswaldo Milian MD Signature Date/Time: 03/12/2020/5:44:29 PM    Final    VAS US CAROTID  Result Date: 03/12/2020 Carotid Arterial Duplex Study Indications:       Numbness and Weakness. Risk Factors:      Hypertension, hyperlipidemia, Diabetes, prior MI, coronary                    artery disease. Comparison Study:  No prior studies. Performing Technologist: Darlin Coco  Examination Guidelines: A complete evaluation includes B-mode imaging, spectral Doppler, color Doppler, and power Doppler as needed of all accessible portions of each vessel. Bilateral testing is considered an integral part of a complete examination. Limited examinations for reoccurring indications may be performed as noted.  Right Carotid Findings:  +----------+--------+--------+--------+---------------------+------------------+           PSV cm/sEDV cm/sStenosisPlaque Description   Comments           +----------+--------+--------+--------+---------------------+------------------+ CCA Prox  70      11              heterogenous and     intimal thickening                                   focal                                   +----------+--------+--------+--------+---------------------+------------------+ CCA Distal69      11                                   intimal thickening +----------+--------+--------+--------+---------------------+------------------+ ICA Prox  78      9       1-39%   calcific                                +----------+--------+--------+--------+---------------------+------------------+ ICA Distal63      11                                                      +----------+--------+--------+--------+---------------------+------------------+ ECA  110                                                             +----------+--------+--------+--------+---------------------+------------------+ +----------+--------+-------+----------------+-------------------+           PSV cm/sEDV cmsDescribe        Arm Pressure (mmHG) +----------+--------+-------+----------------+-------------------+ [id number]            Multiphasic, WNL                    +----------+--------+-------+----------------+-------------------+ +---------+--------+--+--------+--+---------+ VertebralPSV cm/s42EDV cm/s11Antegrade +---------+--------+--+--------+--+---------+  Left Carotid Findings: +----------+--------+--------+--------+------------------+------------------+           PSV cm/sEDV cm/sStenosisPlaque DescriptionComments           +----------+--------+--------+--------+------------------+------------------+ CCA Prox  70      12              focal and smooth  intimal thickening  +----------+--------+--------+--------+------------------+------------------+ CCA Distal67      10                                intimal thickening +----------+--------+--------+--------+------------------+------------------+ ICA Prox  56      10      1-39%   calcific                             +----------+--------+--------+--------+------------------+------------------+ ICA Distal56      11                                                   +----------+--------+--------+--------+------------------+------------------+ +----------+--------+--------+----------------+-------------------+           PSV cm/sEDV cm/sDescribe        Arm Pressure (mmHG) +----------+--------+--------+----------------+-------------------+ [id number]              Multiphasic, WNL                    +----------+--------+--------+----------------+-------------------+ +---------+--------+--+--------+--+---------+ VertebralPSV cm/s59EDV cm/s12Antegrade +---------+--------+--+--------+--+---------+   Summary: Right Carotid: Velocities in the right ICA are consistent with a 1-39% stenosis.                Non-hemodynamically significant plaque <50% noted in the CCA. The                extracranial vessels were near-normal with only minimal wall                thickening or plaque. Left Carotid: Velocities in the left ICA are consistent with a 1-39% stenosis.               Non-hemodynamically significant plaque <50% noted in the CCA. The               extracranial vessels were near-normal with only minimal wall               thickening or plaque. Vertebrals:  Bilateral vertebral arteries demonstrate antegrade flow. Subclavians: Normal flow hemodynamics were seen in bilateral subclavian              arteries. *See table(s) above for measurements and observations.  Electronically signed by  Servando Snare MD on 03/12/2020 at 9:01:50 PM.    Final     Scheduled Meds: . aspirin EC  81 mg Oral QHS  . docusate  sodium  100 mg Oral BID  . enoxaparin (LOVENOX) injection  50 mg Subcutaneous Q24H  . fenofibrate  160 mg Oral QHS  . fluticasone  2 spray Each Nare QHS  . icosapent Ethyl  2 g Oral BID  . insulin aspart  0-5 Units Subcutaneous QHS  . insulin aspart  0-9 Units Subcutaneous TID WC  . insulin aspart  25 Units Subcutaneous TID AC  . insulin glargine  30 Units Subcutaneous Daily  . insulin glargine  60 Units Subcutaneous QHS  . lidocaine  1 patch Transdermal Q24H  . multivitamin with minerals  1 tablet Oral Daily  . oxybutynin  5 mg Oral BID  . pantoprazole  40 mg Oral Daily  . polyethylene glycol  17 g Oral Daily  . rosuvastatin  40 mg Oral QHS  . sertraline  150 mg Oral QHS  . vitamin B-12  500 mcg Oral Daily  . zolpidem  10 mg Oral QHS   Continuous Infusions: . sodium chloride Stopped (03/10/20 2249)  . sodium chloride 125 mL/hr at 03/14/20 0127  . methylPREDNISolone (SOLU-MEDROL) injection 1,000 mg (03/14/20 1043)     LOS: 5 days   Darliss Cheney, MD [facility] Pager On Amion  If 7PM-7AM, please contact night-coverage 03/14/2020, 11:17 AM

## 2020-03-15 ENCOUNTER — Inpatient Hospital Stay (HOSPITAL_COMMUNITY): Payer: Medicare Other

## 2020-03-15 DIAGNOSIS — J9601 Acute respiratory failure with hypoxia: Secondary | ICD-10-CM

## 2020-03-15 DIAGNOSIS — R531 Weakness: Secondary | ICD-10-CM

## 2020-03-15 LAB — CBC WITH DIFFERENTIAL/PLATELET
Abs Immature Granulocytes: 0.06 10*3/uL (ref 0.00–0.07)
Basophils Absolute: 0 10*3/uL (ref 0.0–0.1)
Basophils Relative: 0 %
Eosinophils Absolute: 0 10*3/uL (ref 0.0–0.5)
Eosinophils Relative: 0 %
HCT: 42.8 % (ref 39.0–52.0)
Hemoglobin: 13.6 g/dL (ref 13.0–17.0)
Immature Granulocytes: 1 %
Lymphocytes Relative: 22 %
Lymphs Abs: 2.3 10*3/uL (ref 0.7–4.0)
MCH: 29.1 pg (ref 26.0–34.0)
MCHC: 31.8 g/dL (ref 30.0–36.0)
MCV: 91.5 fL (ref 80.0–100.0)
Monocytes Absolute: 0.5 10*3/uL (ref 0.1–1.0)
Monocytes Relative: 5 %
Neutro Abs: 7.5 10*3/uL (ref 1.7–7.7)
Neutrophils Relative %: 72 %
Platelets: 218 10*3/uL (ref 150–400)
RBC: 4.68 MIL/uL (ref 4.22–5.81)
RDW: 13.6 % (ref 11.5–15.5)
WBC: 10.4 10*3/uL (ref 4.0–10.5)
nRBC: 0 % (ref 0.0–0.2)

## 2020-03-15 LAB — BASIC METABOLIC PANEL
Anion gap: 9 (ref 5–15)
BUN: 36 mg/dL — ABNORMAL HIGH (ref 8–23)
CO2: 21 mmol/L — ABNORMAL LOW (ref 22–32)
Calcium: 8.5 mg/dL — ABNORMAL LOW (ref 8.9–10.3)
Chloride: 107 mmol/L (ref 98–111)
Creatinine, Ser: 0.99 mg/dL (ref 0.61–1.24)
GFR calc Af Amer: >60 mL/min (ref >60)
GFR calc non Af Amer: >60 mL/min (ref >60)
Glucose, Bld: 172 mg/dL — ABNORMAL HIGH (ref 70–99)
Potassium: 4.8 mmol/L (ref 3.5–5.1)
Sodium: 137 mmol/L (ref 135–145)

## 2020-03-15 LAB — GLUCOSE, CAPILLARY
Glucose-Capillary: 126 mg/dL — ABNORMAL HIGH (ref 70–99)
Glucose-Capillary: 52 mg/dL — ABNORMAL LOW (ref 70–99)
Glucose-Capillary: 72 mg/dL (ref 70–99)
Glucose-Capillary: 74 mg/dL (ref 70–99)
Glucose-Capillary: 90 mg/dL (ref 70–99)
Glucose-Capillary: 91 mg/dL (ref 70–99)

## 2020-03-15 LAB — POCT I-STAT 7, (LYTES, BLD GAS, ICA,H+H)
Acid-Base Excess: 1 mmol/L (ref 0.0–2.0)
Bicarbonate: 25.5 mmol/L (ref 20.0–28.0)
Calcium, Ion: 1.23 mmol/L (ref 1.15–1.40)
HCT: 38 % — ABNORMAL LOW (ref 39.0–52.0)
Hemoglobin: 12.9 g/dL — ABNORMAL LOW (ref 13.0–17.0)
O2 Saturation: 99 %
Patient temperature: 97.6
Potassium: 4.5 mmol/L (ref 3.5–5.1)
Sodium: 140 mmol/L (ref 135–145)
TCO2: 27 mmol/L (ref 22–32)
pCO2 arterial: 40.4 mmHg (ref 32.0–48.0)
pH, Arterial: 7.405 (ref 7.350–7.450)
pO2, Arterial: 113 mmHg — ABNORMAL HIGH (ref 83.0–108.0)

## 2020-03-15 LAB — LACTIC ACID, PLASMA: Lactic Acid, Venous: 0.9 mmol/L (ref 0.5–1.9)

## 2020-03-15 LAB — CK: Total CK: 119 U/L (ref 49–397)

## 2020-03-15 LAB — SJOGRENS SYNDROME-A EXTRACTABLE NUCLEAR ANTIBODY: SSA (Ro) (ENA) Antibody, IgG: <0.2 AI (ref 0.0–0.9)

## 2020-03-15 LAB — PROCALCITONIN: Procalcitonin: <0.1 ng/mL

## 2020-03-15 LAB — ANA: Anti Nuclear Antibody (ANA): NEGATIVE

## 2020-03-15 LAB — VITAMIN B12: Vitamin B-12: 814 pg/mL (ref 180–914)

## 2020-03-15 LAB — SEDIMENTATION RATE: Sed Rate: 48 mm/hr — ABNORMAL HIGH (ref 0–16)

## 2020-03-15 LAB — SJOGRENS SYNDROME-B EXTRACTABLE NUCLEAR ANTIBODY: SSB (La) (ENA) Antibody, IgG: <0.2 AI (ref 0.0–0.9)

## 2020-03-15 LAB — TSH: TSH: 2.3 u[IU]/mL (ref 0.350–4.500)

## 2020-03-15 MED ORDER — FUROSEMIDE 10 MG/ML IJ SOLN
INTRAMUSCULAR | Status: AC
  Administered 2020-03-15: 40 mg via INTRAVENOUS
  Filled 2020-03-15: qty 4

## 2020-03-15 MED ORDER — INSULIN ASPART 100 UNIT/ML ~~LOC~~ SOLN
0.0000 [IU] | SUBCUTANEOUS | Status: DC
  Administered 2020-03-16: 1 [IU] via SUBCUTANEOUS
  Administered 2020-03-16: 3 [IU] via SUBCUTANEOUS
  Administered 2020-03-17: 1 [IU] via SUBCUTANEOUS

## 2020-03-15 MED ORDER — ZOLPIDEM TARTRATE 5 MG PO TABS
5.0000 mg | ORAL_TABLET | Freq: Every day | ORAL | Status: DC
  Administered 2020-03-15 – 2020-03-24 (×10): 5 mg via ORAL
  Filled 2020-03-15 (×10): qty 1

## 2020-03-15 MED ORDER — DEXTROSE 50 % IV SOLN
INTRAVENOUS | Status: AC
  Administered 2020-03-15: 50 mL
  Filled 2020-03-15: qty 50

## 2020-03-15 MED ORDER — LORAZEPAM 0.5 MG PO TABS
0.5000 mg | ORAL_TABLET | ORAL | Status: DC | PRN
  Administered 2020-03-15 – 2020-03-16 (×4): 0.5 mg via ORAL
  Filled 2020-03-15 (×4): qty 1

## 2020-03-15 MED ORDER — METOCLOPRAMIDE HCL 5 MG/ML IJ SOLN
10.0000 mg | Freq: Four times a day (QID) | INTRAMUSCULAR | Status: DC
  Administered 2020-03-15 – 2020-03-16 (×2): 10 mg via INTRAVENOUS
  Filled 2020-03-15 (×2): qty 2

## 2020-03-15 MED ORDER — CHLORHEXIDINE GLUCONATE CLOTH 2 % EX PADS
6.0000 | MEDICATED_PAD | Freq: Every day | CUTANEOUS | Status: DC
  Administered 2020-03-16 – 2020-03-24 (×8): 6 via TOPICAL

## 2020-03-15 MED ORDER — CHLORHEXIDINE GLUCONATE 0.12 % MT SOLN
15.0000 mL | Freq: Two times a day (BID) | OROMUCOSAL | Status: DC
  Administered 2020-03-15 – 2020-03-24 (×19): 15 mL via OROMUCOSAL
  Filled 2020-03-15 (×15): qty 15

## 2020-03-15 MED ORDER — INSULIN ASPART 100 UNIT/ML ~~LOC~~ SOLN: 15.0000 [IU] | Freq: Three times a day (TID) | SUBCUTANEOUS | Status: DC

## 2020-03-15 MED ORDER — NYSTATIN 100000 UNIT/GM EX POWD
1.0000 "application " | Freq: Three times a day (TID) | CUTANEOUS | Status: DC
  Administered 2020-03-15 – 2020-03-25 (×28): 1 via TOPICAL
  Filled 2020-03-15 (×2): qty 15

## 2020-03-15 MED ORDER — ORAL CARE MOUTH RINSE
15.0000 mL | Freq: Two times a day (BID) | OROMUCOSAL | Status: DC
  Administered 2020-03-16 – 2020-03-24 (×15): 15 mL via OROMUCOSAL

## 2020-03-15 MED ORDER — BISACODYL 10 MG RE SUPP
10.0000 mg | Freq: Once | RECTAL | Status: DC
  Filled 2020-03-15: qty 1

## 2020-03-15 MED ORDER — FUROSEMIDE 10 MG/ML IJ SOLN
40.0000 mg | Freq: Once | INTRAMUSCULAR | Status: AC
  Administered 2020-03-15: 40 mg via INTRAVENOUS

## 2020-03-15 NOTE — Consult Note (Addendum)
NAME:  Brent Muldrow., MRN:  [id number], DOB:  08-Jan-1946, LOS: 6 ADMISSION DATE:  03/08/2020, CONSULTATION DATE:  03/15/20 REFERRING MD:  Doristine Bosworth, CHIEF COMPLAINT:  SOB   Brief History   74 year old man w/ hx of metabolic syndrome presenting with chest pain followed by increasing weakness and SOB.  Worsened respiratory status prompted CCM consult.  History of present illness   74 year old man with PMH listed below including melanoma presenting with chest pain with neg stress test.  He then developed scattered neurological deficits prompting extensive workup to date (LP, MRI of entire spine, brain).  Differential is presently spinal cord infarct, transverse myelitis, paraneoplastic disease.  Thought to be stable in terms of neuro exam until this evening when he became more SOB.  Sounds like he has not been having many BMs.     Past Medical History  IDDM CAD HTN HLD OSA Prior melanoma of L neck  Significant Hospital Events   6/21 admitted 6/22 LP  Consults:  Neuro, Cardiology  Procedures:  N/A  Significant Diagnostic Tests:  Echo grade 1 diastolic dysfunction MRI Brain neg C/T/L spine w/ wo contrast: 6x73m enhancing lesion in the T9  Micro Data:  COVID 6/20 neg CSF neg  Antimicrobials:  N/A  6/22-6/26 IV steroids 1g daily  Interim history/subjective:  Consulted  Objective   Blood pressure (!) 171/74, pulse 75, temperature 98.5 F (36.9 C), temperature source Oral, resp. rate (!) 22, height 6' (1.829 m), weight 102.7 kg, SpO2 98 %.        Intake/Output Summary (Last 24 hours) at 03/15/2020 1816 Last data filed at 03/15/2020 1004 Gross per 24 hour  Intake 360 ml  Output 2000 ml  Net -1640 ml   Filed Weights   03/08/20 1134 03/08/20 2044 03/12/20 0619  Weight: 101.2 kg 98.4 kg 102.7 kg    Examination: GEN: elderly man in NAD HEENT: MMM, malampatti 4 CV: RRR, ext warm PULM: Poor inspiratory effort, crackles GI:  Protuberant and tympanic to  percussion, +BS EXT: trace edema NEURO: cannot move BL LE, has distal hand weakness PSYCH: RASS 0, fair insight SKIN: No rashes  CBC benign Bmp benign  Stat CXR ordered R>L diffuse infiltrates vs. edema  Resolved Hospital Problem list   N/A  Assessment & Plan:  Neuromuscular weakness of unclear origin Acute hypoxemic respiratory failure related to weakness, question secretion clearance issues and volume overload as well as constipation reducing FRC - Move to ICU, high risk for intubation - CPT, flutter - BIPAP at night and PRN - ESR/TSH/B12/Pct - Will touch base with neuro to see if we need to consider any more aggressive therapies like IVIG or plasmapheresis: for now they think its not related to his neurological issues - Lasix trial, monitor BMP/Mg/Phos  DM with hyperglycemia- sugars now low off steroids - Drop premeal insulin for now  Constipation- KUB reassuring, add suppositories to miralax, reglan  Groin candidal infection- nystatin  Best practice:  Diet: diabetic diet if he turns around with BIPAP Pain/Anxiety/Delirium protocol (if indicated): N/A VAP protocol (if indicated): N/A DVT prophylaxis: Lovenox GI prophylaxis: N/A Glucose control: See above Mobility: BR Code Status: Full Family Communication: Updated wife at bedside Disposition: ICU  Labs   CBC: Recent Labs  Lab 03/11/20 0222 03/12/20 0339 03/13/20 0403 03/14/20 0406 03/15/20 0138  WBC 9.1 10.9* 10.9* 11.3* 10.4  NEUTROABS  --  7.9* 9.2* 7.9* 7.5  HGB 13.4 12.9* 12.5* 12.6* 13.6  HCT 40.4 39.2 38.2*  39.6 42.8  MCV 88.0 88.9 89.9 90.8 91.5  PLT 183 183 199 188 761    Basic Metabolic Panel: Recent Labs  Lab 03/11/20 0222 03/12/20 0339 03/13/20 0403 03/14/20 0406 03/15/20 0138  NA 133* 133* 138 139 137  K 4.3 4.1 4.6 4.8 4.8  CL 100 103 108 110 107  CO2 21* 17* 20* 20* 21*  GLUCOSE 189* 235* 114* 98 172*  BUN 60* 77* 63* 46* 36*  CREATININE 1.74* 1.64* 1.32* 1.06 0.99  CALCIUM  8.9 8.6* 8.5* 8.4* 8.5*   GFR: Estimated Creatinine Clearance: 79.9 mL/min (by C-G formula based on SCr of 0.99 mg/dL). Recent Labs  Lab 03/12/20 0339 03/13/20 0403 03/14/20 0406 03/15/20 0138  WBC 10.9* 10.9* 11.3* 10.4    Liver Function Tests: Recent Labs  Lab 03/10/20 0319 03/10/20 1757 03/11/20 0222  AST 29  --  32  ALT 22  --  24  ALKPHOS 44  --  45  BILITOT 0.6  --  0.6  PROT 6.2*  --  6.3*  ALBUMIN 3.7 4.2 3.6   No results for input(s): LIPASE, AMYLASE in the last 168 hours. No results for input(s): AMMONIA in the last 168 hours.  ABG No results found for: PHART, PCO2ART, PO2ART, HCO3, TCO2, ACIDBASEDEF, O2SAT   Coagulation Profile: No results for input(s): INR, PROTIME in the last 168 hours.  Cardiac Enzymes: No results for input(s): CKTOTAL, CKMB, CKMBINDEX, TROPONINI in the last 168 hours.  HbA1C: Hemoglobin A1C  Date/Time Value Ref Range Status  08/23/2019 09:30 AM 6.4 (A) 4.0 - 5.6 % Final  05/24/2019 09:03 AM 6.4 (A) 4.0 - 5.6 % Final   Hgb A1c MFr Bld  Date/Time Value Ref Range Status  03/09/2020 04:04 AM 6.7 (H) 4.8 - 5.6 % Final    Comment:    (NOTE)         Prediabetes: 5.7 - 6.4         Diabetes: >6.4         Glycemic control for adults with diabetes: <7.0   04/15/2016 11:33 AM 7.4 (H) 4.6 - 6.5 % Final    Comment:    Glycemic Control Guidelines for People with Diabetes:Non Diabetic:  <6%Goal of Therapy: <7%Additional Action Suggested:  >8%     CBG: Recent Labs  Lab 03/14/20 1621 03/14/20 2112 03/15/20 0627 03/15/20 1253 03/15/20 1717  GLUCAP 156* 258* 91 74 72    Review of Systems:    Positive Symptoms in bold:  Constitutional fevers, chills, weight loss, fatigue, anorexia, malaise  Eyes decreased vision, double vision, eye irritation  Ears, Nose, Mouth, Throat sore throat, trouble swallowing, sinus congestion  Cardiovascular chest pain, paroxysmal nocturnal dyspnea, lower ext edema, palpitations   Respiratory SOB,  cough, DOE, hemoptysis, wheezing  Gastrointestinal nausea, vomiting, diarrhea  Genitourinary burning with urination, trouble urinating  Musculoskeletal joint aches, joint swelling, back pain  Integumentary  rashes, skin lesions  Neurological focal weakness, focal numbness, trouble speaking, headaches  Psychiatric depression, anxiety, confusion  Endocrine polyuria, polydipsia, cold intolerance, heat intolerance  Hematologic abnormal bruising, abnormal bleeding, unexplained nose bleeds  Allergic/Immunologic recurrent infections, hives, swollen lymph nodes     Past Medical History  He,  has a past medical history of Allergy, CAD (coronary artery disease), Diabetes mellitus, Diverticulosis, Elevated CK, Erectile dysfunction, Hemorrhoids, HTN (hypertension), Hyperlipidemia, Hypertriglyceridemia, Malignant melanoma of left side of neck ([place]) (10/25/2018), Myocardial infarction ([place]), Obesity, OSA (obstructive sleep apnea), Persistent disorder of initiating or maintaining sleep, and Personal history of  colonic polyps (02/05/2003).   Surgical History    Past Surgical History:  Procedure Laterality Date  . CHOLECYSTECTOMY    . CORONARY ANGIOPLASTY    . CORONARY STENT PLACEMENT     stenting of the right coronary artery with followup rotational  atherectomy. (3 stents placed)  . FINGER SURGERY     right  . FOOT SURGERY     right  . INGUINAL HERNIA REPAIR     right  . melanoma removal     neck  . ORTHOPEDIC SURGERY     foot right  . rotator cuff surg     Bil     Social History   reports that he quit smoking about 39 years ago. His smoking use included cigarettes. He has a 45.00 pack-year smoking history. He has never used smokeless tobacco. He reports current alcohol use of about 7.0 - 14.0 standard drinks of alcohol per week. He reports that he does not use drugs.   Family History   His family history includes COPD in his mother; Heart disease in his father and sister; Liver cancer in  his father; Lung cancer in his father and mother; Obesity in his mother. There is no history of Colon cancer, Stomach cancer, or Pancreatic cancer.   Allergies Allergies  Allergen Reactions  . Penicillins Hives and Rash     Home Medications  Prior to Admission medications   Medication Sig Start Date End Date Taking? Authorizing Provider  aspirin EC 81 MG tablet Take 81 mg by mouth at bedtime. Swallow whole.   Yes [provider]  empagliflozin (JARDIANCE) 25 MG TABS tablet Take 12.5 mg by mouth daily.   Yes [provider]  fenofibrate 160 MG tablet Take 160 mg by mouth at bedtime.    Yes [provider]  fluticasone (FLONASE) 50 MCG/ACT nasal spray SPRAY 2 SPRAYS INTO EACH NOSTRIL EVERY DAY Patient taking differently: Place 2 sprays into both nostrils at bedtime.  12/23/19  Yes Young, Tarri Fuller D, MD  ibuprofen (ADVIL) 200 MG tablet Take 600 mg by mouth daily as needed for headache (pain).   Yes [provider]  Icosapent Ethyl (VASCEPA) 1 g CAPS Take 2 capsules (2 g total) by mouth 2 (two) times daily. 07/19/19  Yes Dunn, Dayna N, PA-C  insulin aspart (NOVOLOG FLEXPEN) 100 UNIT/ML FlexPen Inject 25-30 Units into the skin 3 (three) times daily before meals.    Yes [provider]  insulin glargine (LANTUS) 100 unit/mL SOPN Inject 60 Units into the skin at bedtime.    Yes [provider]  lisinopril (ZESTRIL) 10 MG tablet Take 10 mg by mouth daily. For kidney protection   Yes [provider]  LORazepam (ATIVAN) 1 MG tablet TAKE 1 TABLET BY MOUTH TWICE A DAY AS NEEDED FOR ANXIETY Patient taking differently: Take 1 mg by mouth See admin instructions. Take one tablet (1 mg) by mouth daily at bedtime, may also take one tablet (1 mg) during the day as needed for anxiety 11/14/19  Yes Dutch Quint B, FNP  metFORMIN (GLUCOPHAGE) 1000 MG tablet Take 1,000 mg by mouth 2 (two) times daily with a meal.   Yes [provider]  Multiple  Vitamin (MULTIVITAMIN WITH MINERALS) TABS tablet Take 1 tablet by mouth daily. Centrum Silver Men's   Yes [provider]  oxybutynin (DITROPAN) 5 MG tablet Take 5 mg by mouth 2 (two) times daily.    Yes [provider]  rosuvastatin (CRESTOR) 40 MG tablet  Take 40 mg by mouth at bedtime.  04/18/18  Yes Burnell Blanks, MD  sertraline (ZOLOFT) 100 MG tablet Take 150 mg by mouth at bedtime.    Yes [provider]  vitamin B-12 (CYANOCOBALAMIN) 500 MCG tablet Take 500 mcg by mouth daily.   Yes [provider]  zolpidem (AMBIEN) 10 MG tablet Take 10 mg by mouth at bedtime. For insomnia  04/24/12  Yes Swords, Darrick Penna, MD  benzonatate (TESSALON) 100 MG capsule Take 1 capsule (100 mg total) by mouth 2 (two) times daily as needed for cough. Patient not taking: Reported on 03/06/2020 12/19/19   [name], [name], MD  lisinopril (ZESTRIL) 20 MG tablet Take 1 tablet (20 mg total) by mouth daily. 08/23/19 03/06/20  [name], [name], MD  triamcinolone ointment (KENALOG) 0.1 % Apply 1 application topically 2 (two) times daily. Patient not taking: Reported on 03/08/2020 07/27/19 07/26/20  Midge Minium, MD     Critical care time: 38 minutes

## 2020-03-15 NOTE — Significant Event (Addendum)
Rapid Response Event Note  Overview: Time Called: [place] Time: 1735 Event Type: Respiratory Acute oxygen desaturation and increased work of breathing. Pt was on 5LNC with acute oxygen desaturation to mid 80s.  Initial Focused Assessment: Pt lying in bed, awake/alert. He is able to follow commands and answers questions appropriately. Pt endorses pain in his back, upper thoracic region- this pain has been ongoing. He denies any other pains. Skin is warm to touch. Mild diaphoresis to pt's face and upper chest. Accessory muscle use noted. Abdomen is round, distended, firm. Lung sounds are clear, diminished. Pt is able to move his upper extremities, but endorses weakness/immobility to his lower extremities.   Interventions: -100% NRB -PCCM consulted: new orders for KUB and CXR -Transfer to ICU  Plan of Care (if not transferred): -Transfer to ICU -BiPAP  Event Summary: Name of Physician Notified: Dr. Doristine Bosworth at 1745 Outcome: Transferred (Comment) Event End Time: [place]

## 2020-03-15 NOTE — Progress Notes (Addendum)
PROGRESS NOTE    Brent [name].  WUJ:[id number] DOB: 1945-10-25 DOA: 03/08/2020 PCP: [name], [name], MD    Brief Narrative:  74 y.o. male with medical history significant of remote MI stenting x3 in 1990s, CAD, IDDM, DM neuropathy, sciatica with injection treatment 2 years ago, HTN, HLD, came with new onset of chest pain which started when patient was in the church and standing.  Pressure-like 8-9 over 10, associated with feeling of nausea and sweating no vomiting no short of breath no palpitations.  EMS arrived and gave patient some aspirin nitroglycerin sublingual with little improvement.  Then upon arrival in the ED the chest pain slowly progressed to radiate to the left shoulder and his back, then slowly subsided.  While staying in the ED patient started to feel weakness of the whole right-sided leg, he remember that while lying on the stretcher in the ambulance, the stretcher had a bump right beneath his right hip area, where he had " bad inflamed nerve which received injection about 2 years ago in [place]".   He denieD any numbness or loss of feeling but only weakness from hip to toe on the right side.  He denied baseline claudication, he does have chronic back pain on and off.  Patient also admitted he has a baseline diabetic neuropathy with frequent on and off tingling sensation of toes and fingertips. Upon arrival to ED, troponin II sets negative, CTA dissection study negative.  Patient was admitted to hospital service.  Cardiology was consulted.  He underwent nuclear stress test which was unremarkable.  He then started having weakness in left lower extremity as well as decreased grip strength in bilateral hands.  Neurology was consulted. C Spine MRI was ordered which was without any remarkable acute findings per official radiology report however per neurology MRI spine shows subtle signal change on cervical, thoracic spine and he had discussed with neuroradiology as well.  T-spine MRI  showed 6 x 10 mm enhancing lesion left T9 vertebral body, low signal on T1 and high signal on T2.  CT head was unremarkable.  LP was also unremarkable.  His initial working diagnosis was transverse myelitis however the work-up has not been indicative of that.  Patient was started on high-dose Solu-Medrol 1000 mg IV daily for 5 days starting 03/10/2020.  Assessment & Plan:   Active Problems:   Chest pain   Weakness   Quadriplegia (HCC)   Coronary artery disease involving native coronary artery of native heart without angina pectoris   Steroid-induced hyperglycemia   Diabetic peripheral neuropathy (HCC)   Tachypnea   Hyponatremia   AKI (acute kidney injury) ([place])  Chest pain -Pt has significant prior cardiac history -Given risk factors, Cardiology consulted and pt underwent nuclear stress test on 6/21, reviewed, with no reversible ischemia or infarct. LVEF of 53%, normal LV wall motion. Low risk study -Continue his cardiac meds as tolerated  Progressive BLE weakness, numbness, loss of B hand grip strength  -new findings since initial presentation -Symptoms initially with RLE, since progressing to involve BLE weakness and B grip strength.  -Appreciate assistance by Neurology.  C Spine MRI was ordered which was without any remarkable acute findings per official radiology report however per neurology MRI spine shows subtle signal change on cervical, thoracic spine and he had discussed with neuroradiology as well.  T-spine MRI showed 6 x 10 mm enhancing lesion left T9 vertebral body, low signal on T1 and high signal on T2.  CT head was  unremarkable.  LP was also unremarkable.  He was started on high-dose Solu-Medrol for 5 days with first day starting on 03/10/2020.  The work-up so far has not been indicative of transverse myelitis but this along with possible spinal infarct is so far his working diagnosis per neurology and he is getting Solu-Medrol empirically for that.  He has not noted any  improvement in his weakness since started on Solu-Medrol.  He is going to receive his last dose today.Dr. Lorraine Lax discussed case with [facility] neuro immunologist on 03/15/2020 who had declined transfer and opined that there is nothing further that they have to offer and agreed with the treatment that patient is receiving here.  Due to consideration of possible infarct, I discussed with Dr. Lorraine Lax about possibility of arranging loop recorder for this patient.  He agrees with that plan.  We have not seen any atrial fibrillation on telemetry here.  I have consulted cardiology today who will see patient today and decide whether he would be a candidate for loop recorder or 30-day event monitor.  Neurology plans to repeat MRI spine tomorrow to see if there is any change.  IDDM/type 2 diabetes mellitus Blood sugar this morning was 91.  Was elevated last night.  Now that he is not on high-dose Solu-Medrol so I will discontinue his Lantus and continue rest of the premeal as well as SSI.  Essential HTN -Continue home meds -BP stable and controlled at this time  HLD -Continue with statin as tolerated  Obesity -Recommend diet/lifestyle modification  Hx malignant melanoma -chart reviewed. Pt s/p biopsy on 10/19, followed by [facility] -Seems to be stable currently  AKI/dehydration: Came in with normal GFR.  Creatinine jumped to 1.74.  AKI has now resolved.  Stop IV fluids.  Constipation: Resolved.  Continue daily MiraLAX and Colace.  DVT prophylaxis: Place and maintain sequential compression device Start: 03/09/20 1849 And Lovenox Code Status: Full Family Communication: Pt in room, family currently not at bedside  Status is: Inpatient  Remains inpatient appropriate because:Ongoing diagnostic testing needed not appropriate for outpatient work up and Inpatient level of care appropriate due to severity of illness  Dispo: The patient is from: Home              Anticipated d/c is to: CIR              Anticipated  d/c date is:1-2 days              Patient currently is not medically stable to d/c.     Consultants:   Cardiology  Neurology  Procedures:   Stress test 6/21  Antimicrobials: Anti-infectives (From admission, onward)   None      Subjective: Patient seen and examined.  No new complaint but no improvement either.  Objective: Vitals:   03/14/20 1928 03/14/20 2307 03/15/20 0503 03/15/20 0917  BP: 130/66 (!) 143/62 (!) 132/58 124/64  Pulse: 68 67 69 60  Resp: 19 (!) 21 (!) 23 19  Temp: (!) 97.5 F (36.4 C) 98.1 F (36.7 C) 98 F (36.7 C) 97.9 F (36.6 C)  TempSrc: Oral Oral Oral Oral  SpO2: 95% 95% 93% 93%  Weight:      Height:        Intake/Output Summary (Last 24 hours) at 03/15/2020 1213 Last data filed at 03/15/2020 1004 Gross per 24 hour  Intake 480 ml  Output 2900 ml  Net -2420 ml   Filed Weights   03/08/20 1134 03/08/20 2044 03/12/20 4431  Weight: 101.2 kg 98.4 kg 102.7 kg    Examination:  General exam: Appears calm and comfortable  Respiratory system: Clear to auscultation. Respiratory effort normal. Cardiovascular system: S1 & S2 heard, RRR. No JVD, murmurs, rubs, gallops or clicks. No pedal edema. Gastrointestinal system: Abdomen is nondistended, soft and nontender. No organomegaly or masses felt. Normal bowel sounds heard. Central nervous system: Alert and oriented.  0/5 power in lower extremities.  Poor handgrip.  Brisk deep tendon reflexes in bilateral lower extremities with increased tone.  Decreased sensation below mid abdomen as well. Skin: No rashes, lesions or ulcers.  Psychiatry: Judgement and insight appear normal. Mood & affect appropriate.    Data Reviewed: I have personally reviewed following labs and imaging studies  CBC: Recent Labs  Lab 03/11/20 0222 03/12/20 0339 03/13/20 0403 03/14/20 0406 03/15/20 0138  WBC 9.1 10.9* 10.9* 11.3* 10.4  NEUTROABS  --  7.9* 9.2* 7.9* 7.5  HGB 13.4 12.9* 12.5* 12.6* 13.6  HCT 40.4 39.2 38.2*  39.6 42.8  MCV 88.0 88.9 89.9 90.8 91.5  PLT 183 183 199 188 191   Basic Metabolic Panel: Recent Labs  Lab 03/11/20 0222 03/12/20 0339 03/13/20 0403 03/14/20 0406 03/15/20 0138  NA 133* 133* 138 139 137  K 4.3 4.1 4.6 4.8 4.8  CL 100 103 108 110 107  CO2 21* 17* 20* 20* 21*  GLUCOSE 189* 235* 114* 98 172*  BUN 60* 77* 63* 46* 36*  CREATININE 1.74* 1.64* 1.32* 1.06 0.99  CALCIUM 8.9 8.6* 8.5* 8.4* 8.5*   GFR: Estimated Creatinine Clearance: 79.9 mL/min (by C-G formula based on SCr of 0.99 mg/dL). Liver Function Tests: Recent Labs  Lab 03/10/20 0319 03/10/20 1757 03/11/20 0222  AST 29  --  32  ALT 22  --  24  ALKPHOS 44  --  45  BILITOT 0.6  --  0.6  PROT 6.2*  --  6.3*  ALBUMIN 3.7 4.2 3.6   No results for input(s): LIPASE, AMYLASE in the last 168 hours. No results for input(s): AMMONIA in the last 168 hours. Coagulation Profile: No results for input(s): INR, PROTIME in the last 168 hours. Cardiac Enzymes: No results for input(s): CKTOTAL, CKMB, CKMBINDEX, TROPONINI in the last 168 hours. BNP (last 3 results) No results for input(s): PROBNP in the last 8760 hours. HbA1C: No results for input(s): HGBA1C in the last 72 hours. CBG: Recent Labs  Lab 03/14/20 0631 03/14/20 1125 03/14/20 1621 03/14/20 2112 03/15/20 0627  GLUCAP 76 124* 156* 258* 91   Lipid Profile: No results for input(s): CHOL, HDL, LDLCALC, TRIG, CHOLHDL, LDLDIRECT in the last 72 hours. Thyroid Function Tests: No results for input(s): TSH, T4TOTAL, FREET4, T3FREE, THYROIDAB in the last 72 hours. Anemia Panel: No results for input(s): VITAMINB12, FOLATE, FERRITIN, TIBC, IRON, RETICCTPCT in the last 72 hours. Sepsis Labs: No results for input(s): PROCALCITON, LATICACIDVEN in the last 168 hours.  Recent Results (from the past 240 hour(s))  SARS Coronavirus 2 by RT PCR (hospital order, performed in [facility] hospital lab) Nasopharyngeal Nasopharyngeal Swab     Status: None   Collection  Time: 03/08/20 11:33 AM   Specimen: Nasopharyngeal Swab  Result Value Ref Range Status   SARS Coronavirus 2 NEGATIVE NEGATIVE Final    Comment: (NOTE) SARS-CoV-2 target nucleic acids are NOT DETECTED.  The SARS-CoV-2 RNA is generally detectable in upper and lower respiratory specimens during the acute phase of infection. The lowest concentration of SARS-CoV-2 viral copies this assay can detect is 250  copies / mL. A negative result does not preclude SARS-CoV-2 infection and should not be used as the sole basis for treatment or other patient management decisions.  A negative result may occur with improper specimen collection / handling, submission of specimen other than nasopharyngeal swab, presence of viral mutation(s) within the areas targeted by this assay, and inadequate number of viral copies (<250 copies / mL). A negative result must be combined with clinical observations, patient history, and epidemiological information.  Fact Sheet for Patients:   StrictlyIdeas.no  Fact Sheet for Healthcare Providers: BankingDealers.co.za  This test is not yet approved or  cleared by the [place] FDA and has been authorized for detection and/or diagnosis of SARS-CoV-2 by FDA under an Emergency Use Authorization (EUA).  This EUA will remain in effect (meaning this test can be used) for the duration of the COVID-19 declaration under Section 564(b)(1) of the Act, 21 U.S.C. section 360bbb-3(b)(1), unless the authorization is terminated or revoked sooner.  Performed at [facility], [place] [address] [place], [place] [address]   Gram stain     Status: None   Collection Time: 03/10/20  5:15 PM   Specimen: PATH Cytology CSF; Cerebrospinal Fluid  Result Value Ref Range Status   Specimen Description CSF  Final   Special Requests NONE  Final   Gram Stain   Final    WBC PRESENT, PREDOMINANTLY PMN NO ORGANISMS SEEN CYTOSPIN SMEAR Performed  at [facility], [place] [address], [place], [place] [address]    Report Status 03/10/2020 FINAL  Final     Radiology Studies: No results found.  Scheduled Meds: . aspirin EC  81 mg Oral QHS  . docusate sodium  100 mg Oral BID  . enoxaparin (LOVENOX) injection  50 mg Subcutaneous Q24H  . fenofibrate  160 mg Oral QHS  . fluticasone  2 spray Each Nare QHS  . icosapent Ethyl  2 g Oral BID  . insulin aspart  0-5 Units Subcutaneous QHS  . insulin aspart  0-9 Units Subcutaneous TID WC  . insulin aspart  25 Units Subcutaneous TID AC  . insulin glargine  60 Units Subcutaneous QHS  . lidocaine  1 patch Transdermal Q24H  . multivitamin with minerals  1 tablet Oral Daily  . oxybutynin  5 mg Oral BID  . pantoprazole  40 mg Oral Daily  . polyethylene glycol  17 g Oral Daily  . rosuvastatin  40 mg Oral QHS  . sertraline  150 mg Oral QHS  . vitamin B-12  500 mcg Oral Daily  . zolpidem  10 mg Oral QHS   Continuous Infusions: . sodium chloride Stopped (03/10/20 2249)  . sodium chloride 125 mL/hr at 03/15/20 0526     LOS: 6 days   Darliss Cheney, MD [facility] Pager On Amion  If 7PM-7AM, please contact night-coverage 03/15/2020, 12:13 PM

## 2020-03-15 NOTE — Progress Notes (Signed)
Gave report 3100 nurse transported pt.

## 2020-03-15 NOTE — Consult Note (Signed)
CARDIOLOGY CONSULT NOTE     Primary Care Physician: [name], [name], MD Referring Physician:  Admit Date: 03/08/2020  Reason for consultation:  Consideration of monitoring to evaluate for afib as cause for spinal injury  Brent [name]. is a 74 y.o. male with a h/o CAD, diabetes, and HTN.  He was initially admitted with chest pain.  He was evaluated by cardiology and had low risk myoview.  Cardiology has signed off.  The patient did have a spinal cord lesion which has caused weakness.  There is concern that this could be cardioembolic in nature.  The patient does not have a known history of afib.   Echo with bubble study this admission revealed normal EF with negative bubble study.  He is planned to go to rehab soon.  He has developed mild SOB.  Denies further CP.     Past Medical History:  Diagnosis Date  . Allergy   . CAD (coronary artery disease)    a. angioplasty of his RCA in 1990. b. bare metal stent placed in the RCA in 2000 followed by rotational atherectomy shortly after for stent restenosis. c. last cath was in 2012 showing stable moderate diffuse CAD. (70% mid LAD, 80% diagonal, 70% Ramus, 40% mid to distal RCA stent restenosis). d. Low risk nuc in 2015.  . Diabetes mellitus   . Diverticulosis   . Elevated CK   . Erectile dysfunction   . Hemorrhoids   . HTN (hypertension)   . Hyperlipidemia   . Hypertriglyceridemia   . Malignant melanoma of left side of neck ([place]) 10/25/2018  . Myocardial infarction ([place])   . Obesity   . OSA (obstructive sleep apnea)   . Persistent disorder of initiating or maintaining sleep   . Personal history of colonic polyps 02/05/2003   Past Surgical History:  Procedure Laterality Date  . CHOLECYSTECTOMY    . CORONARY ANGIOPLASTY    . CORONARY STENT PLACEMENT     stenting of the right coronary artery with followup rotational  atherectomy. (3 stents placed)  . FINGER SURGERY     right  . FOOT SURGERY     right  . INGUINAL  HERNIA REPAIR     right  . melanoma removal     neck  . ORTHOPEDIC SURGERY     foot right  . rotator cuff surg     Bil    . aspirin EC  81 mg Oral QHS  . docusate sodium  100 mg Oral BID  . enoxaparin (LOVENOX) injection  50 mg Subcutaneous Q24H  . fenofibrate  160 mg Oral QHS  . fluticasone  2 spray Each Nare QHS  . icosapent Ethyl  2 g Oral BID  . insulin aspart  0-5 Units Subcutaneous QHS  . insulin aspart  0-9 Units Subcutaneous TID WC  . insulin aspart  25 Units Subcutaneous TID AC  . insulin glargine  60 Units Subcutaneous QHS  . lidocaine  1 patch Transdermal Q24H  . multivitamin with minerals  1 tablet Oral Daily  . oxybutynin  5 mg Oral BID  . pantoprazole  40 mg Oral Daily  . polyethylene glycol  17 g Oral Daily  . rosuvastatin  40 mg Oral QHS  . sertraline  150 mg Oral QHS  . vitamin B-12  500 mcg Oral Daily  . zolpidem  10 mg Oral QHS   . sodium chloride Stopped (03/10/20 2249)    Allergies  Allergen Reactions  . Penicillins Hives  and Rash    Social History   Socioeconomic History  . Marital status: Married    Spouse name: Not on file  . Number of children: 1  . Years of education: Not on file  . Highest education level: Not on file  Occupational History  . Occupation: Teacher, English as a foreign language    Comment: [place] Use  . Smoking status: Former Smoker    Packs/day: 1.50    Years: 30.00    Pack years: 45.00    Types: Cigarettes    Quit date: 09/19/1980    Years since quitting: 39.5  . Smokeless tobacco: Never Used  Vaping Use  . Vaping Use: Never used  Substance and Sexual Activity  . Alcohol use: Yes    Alcohol/week: 7.0 - 14.0 standard drinks    Types: 7 - 14 Shots of liquor per week  . Drug use: No  . Sexual activity: Not on file  Other Topics Concern  . Not on file  Social History Narrative   12/14/2018: Lives with wife and 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 (son had passed away several years ago)   Recevies much of his care through [place]   Enjoys  golfing   Social Determinants of Health   Financial Resource Strain: Low Risk   . Difficulty of Paying Living Expenses: Not hard at all  Food Insecurity:   . Worried About Charity fundraiser in the Last Year:   . Arboriculturist in the Last Year:   Transportation Needs: No Transportation Needs  . Lack of Transportation (Medical): No  . Lack of Transportation (Non-Medical): No  Physical Activity:   . Days of Exercise per Week:   . Minutes of Exercise per Session:   Stress:   . Feeling of Stress :   Social Connections:   . Frequency of Communication with Friends and Family:   . Frequency of Social Gatherings with Friends and Family:   . Attends Religious Services:   . Active Member of Clubs or Organizations:   . Attends Archivist Meetings:   Marland Kitchen Marital Status:   Intimate Partner Violence:   . Fear of Current or Ex-Partner:   . Emotionally Abused:   Marland Kitchen Physically Abused:   . Sexually Abused:     Family History  Problem Relation Age of Onset  . Liver cancer Father   . Lung cancer Father   . Heart disease Father   . Heart disease Sister   . Lung cancer Mother   . COPD Mother   . Obesity Mother   . Colon cancer Neg Hx   . Stomach cancer Neg Hx   . Pancreatic cancer Neg Hx     ROS- All systems are reviewed and negative except as per the HPI above  Physical Exam: Telemetry: Vitals:   03/14/20 2307 03/15/20 0503 03/15/20 0917 03/15/20 1256  BP: (!) 143/62 (!) 132/58 124/64 127/74  Pulse: 67 69 60   Resp: (!) 21 (!) 23 19 (!) 31  Temp: 98.1 F (36.7 C) 98 F (36.7 C) 97.9 F (36.6 C) 98.5 F (36.9 C)  TempSrc: Oral Oral Oral Oral  SpO2: 95% 93% 93% 95%  Weight:      Height:        GEN- The patient is ill appearing, alert and oriented x 3 today.   Head- normocephalic, atraumatic Eyes-  Sclera clear, conjunctiva pink Ears- hearing intact Oropharynx- clear Neck- supple,   Lungs-   normal work of breathing Heart-  Regular rate and rhythm  GI- soft    Extremities- no clubbing, cyanosis, + dependant edema edema Skin- no rash or lesion Psych- euthymic mood, full affect  EKG:  All ekgs in epic back to 2014 are reviewed and do not reveal AF.  Labs:   Lab Results  Component Value Date   WBC 10.4 03/15/2020   HGB 13.6 03/15/2020   HCT 42.8 03/15/2020   MCV 91.5 03/15/2020   PLT 218 03/15/2020    Recent Labs  Lab 03/11/20 0222 03/12/20 0339 03/15/20 0138  NA 133*   < > 137  K 4.3   < > 4.8  CL 100   < > 107  CO2 21*   < > 21*  BUN 60*   < > 36*  CREATININE 1.74*   < > 0.99  CALCIUM 8.9   < > 8.5*  PROT 6.3*  --   --   BILITOT 0.6  --   --   ALKPHOS 45  --   --   ALT 24  --   --   AST 32  --   --   GLUCOSE 189*   < > 172*   < > = values in this interval not displayed.   Lab Results  Component Value Date   CKTOTAL 125 04/11/2019   CKMB 1.9 02/14/2011   TROPONINI  02/14/2011    <0.30        Due to the release kinetics of cTnI, a negative result within the first hours of the onset of symptoms does not rule out myocardial infarction with certainty. If myocardial infarction is still suspected, repeat the test at appropriate intervals. **Please note change in reference range.**      Echo:  Reviewed as above  ASSESSMENT AND PLAN:   1. ? Spinal cord infarct There is consideration of spinal infarct as the possible cause of his weakness.  On review of MRIs and notes, this does not appear to be a definitive diagnosis. EP is asked to consider monitoring for afib as a possible cause of his event. He is planned for rehab.  At this time, I would advise 30 day monitor rather than log term monitoring.  We will arrange 30 day monitor at discharge.  He can follow-up with EP APP in 2 months for further evaluation and consideration of ILR if he has made good recovery and is done with rehab at that time.  Electrophysiology team to see as needed while here. Please call with questions.   Thompson Grayer, MD 03/15/2020  1:46  PM

## 2020-03-15 NOTE — Progress Notes (Signed)
NIF > -40, VC 1.27L.  Pt gave good effort.

## 2020-03-15 NOTE — Progress Notes (Signed)
Reason for consult: Myelopathy/bilateral lower extremity weakness  Subjective: Patient states that he did not sleep well last night.  Otherwise no significant change in his exam.   ROS: negative except above  Examination  Vital signs in last 24 hours: Temp:  [97.9 F (36.6 C)-98.5 F (36.9 C)] 98.5 F (36.9 C) (06/27 1256) Pulse Rate:  [60-75] 69 (06/27 1900) Resp:  [19-37] 34 (06/27 1900) BP: (95-171)/(58-90) 95/61 (06/27 1900) SpO2:  [93 %-98 %] 95 % (06/27 1900)  General: lying in bed CVS: pulse-normal rate and rhythm RS: breathing comfortably Abd: distended Extremities: normal   Neuro: MS: Alert, oriented, follows commands CN: pupils equal and reactive,  EOMI, face symmetric, tongue midline, normal sensation over face, Motor: Bilateral upper extremities 4+/5 with reduced hand grip, lower extremities 0/5  Reflexes: 3+ bilateral patella with crossed adductor's positive Coordination: normal Gait: not tested  Basic Metabolic Panel: Recent Labs  Lab 03/11/20 0222 03/11/20 0222 03/12/20 0339 03/12/20 0339 03/13/20 0403 03/14/20 0406 03/15/20 0138  NA 133*  --  133*  --  138 139 137  K 4.3  --  4.1  --  4.6 4.8 4.8  CL 100  --  103  --  108 110 107  CO2 21*  --  17*  --  20* 20* 21*  GLUCOSE 189*  --  235*  --  114* 98 172*  BUN 60*  --  77*  --  63* 46* 36*  CREATININE 1.74*  --  1.64*  --  1.32* 1.06 0.99  CALCIUM 8.9   < > 8.6*   < > 8.5* 8.4* 8.5*   < > = values in this interval not displayed.    CBC: Recent Labs  Lab 03/11/20 0222 03/12/20 0339 03/13/20 0403 03/14/20 0406 03/15/20 0138  WBC 9.1 10.9* 10.9* 11.3* 10.4  NEUTROABS  --  7.9* 9.2* 7.9* 7.5  HGB 13.4 12.9* 12.5* 12.6* 13.6  HCT 40.4 39.2 38.2* 39.6 42.8  MCV 88.0 88.9 89.9 90.8 91.5  PLT 183 183 199 188 218     Coagulation Studies: No results for input(s): LABPROT, INR in the last 72 hours.  Imaging Reviewed:     ASSESSMENT AND PLAN  74 year old male with past medical  history significant for coronary artery disease, diabetes mellitus, hypertension, hyperlipidemia, obstructive sleep apnea, obesity presents to the emergency department with chest pain, diaphoresis as well as sharp back pain-CTA ruled out dissection.  Subsequently had progressive weakness,starting with right leg and left leg and then bilateral distal upper extremity weakness. Sensory level T6-T9.  Stat MRI C, T, L spineperformed which was reported as negative yesterday:however,on my reviewshows subtlesignal change or cervical,thoracic spine.I discussed with neuro radswhoalso agree there is subtle signal abnormality in thoracic spine.  Edited report: "Examination discussed with Dr. Macy Mis, who had previously discussed with Dr. Karena Addison Raylan Troiani. On further review, I agree there is abnormal T2-weighted signal intensity in the upper thoracic spinal cord. There is no spinal cord expansion. There are no abnormal flow voids along the surface of the spinal cord. This could indicate a subacute to chronic spinal cord infarction. Demyelinating disease or transverse myelitis are also possibilities. The patient is undergoing a contrast-enhanced cervical and thoracic spine MRI. MRI C-spine and T-spine with contrast did not show any contrast-enhancement"  Underwent LP under fluoroscopy: CSF cell count 1 Protein 53 IgG index: normal  Other labs include B12 714 Ceruloplasmin normal Folate normal A1c 6.7 SSA,SSB: Negative ACE: Negative  Pending:  NMO ANA, RF  B1 Serum paraneoplastic panel Serum MOG antibody  Discussed case with neuro-immunologist from [facility] for transfer.  He reviewed charts/notes and imaging and agrees that there is a signal change in the thoracic cord. In the absence of contrast enhancement and that patient is not progressively getting worse clinically, does not feel there is any role for IVIG/plasmapheresis.  Also feels spinal cord infarct may still be  possibility despite progression over 12- 16 hours.  Declined transfer as he felt there was no additional testing or treatment that he could offer.  Recommend to go ahead and get paraneoplastic panel which have ordered.   Acute myelopathywith signal change in upper thoracic spinal cord with no contrast-enhancement -Differential includes spinal cord infarct versuslongitudinal extensivetransverse myelitis  Recommendations -IVSolumedrol1 gx 5 days. -Aggressive PT OT -Consider aspirin 81 mg daily and atorvastatin20 mg daily -Protonix 40 mg daily -NIFevery 12 hours -Frequent neurochecks -MRI brain negative for acute infarct/demyelination. Echocardiogram completed : No PFO, EF 60 to 65%.  Telemetry so far does not show A. Fib. Outpatient loop monitor. - repeat MRI C and T spine on Monday to see if lesion is stable or worse  Addendum -Transferred to ICU due to increased work of breathing, abdominal distention which has been ongoing likely due to mp bowel movementsx 5days -hospitalist service has been working on this. Reexamined patient-has good strength in neck flexors, good upper motor strength.  I doubt that the this is related to expansion of his myelitis.   Karena Addison Tamico Mundo [facility] Pager Number [phone] For questions after 7pm please refer to AMION to reach the Neurologist on call

## 2020-03-15 NOTE — PMR Pre-admission (Addendum)
PMR Admission Coordinator Pre-Admission Assessment  Patient: Brent [name]. is an 74 y.o., male MRN: [id number] DOB: 02/06/1946 Height: 6' (182.9 cm) Weight: 102.7 kg              Insurance Information HMO:     PPO:      PCP:      IPA:      80/20: yes     OTHER:  PRIMARY: Medicare A and B       Policy#: [id number]      Subscriber: patient CM Name:       Phone#:      Fax#:  Pre-Cert#:       Employer:  Benefits:  Phone #:      Name: verified eligibility online via [place] on 03/23/20 Eff. Date: Medicare Part A and B effective 08/19/2009;      Deduct: $1,484      Out of Pocket Max: NA      Life Max: NA  CIR: Covered per Medicare guidelines once yearly deductible has been met      SNF: days 1-20, 100%; days 21-100, 80% Outpatient: 80%     Co-Pay: 20% Home Health: 100%      Co-Pay:  DME: 80%     Co-Pay: 20% Providers: Pt's choice SECONDARY: [employer]      Policy#: [id number]      Phone#: [phone]  Financial Counselor:       Phone#:  The "Data Collection Information Summary" for patients in Inpatient Rehabilitation Facilities with attached "Privacy Act [place] Records" was provided and verbally reviewed with: Patient  Emergency Contact Information Contact Information     Name Relation Home Work Mobile   Jenkins Spouse [phone]  [phone]      Current Medical History  Patient Admitting Diagnosis: 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   History of Present Illness:Brent [name] is a 74 year old right-handed male with history of remote MI stenting x3 in the 1990s maintained on aspirin, diabetes mellitus with neuropathy, hyperlipidemia, obesity with BMI 29.42.  Per chart review lives with spouse and 91 year old grandson.  History taken from chart review patient and wife.  Reportedly independent prior to admission.  Two-level home half bath on main level bedroom upstairs.  Presented 03/08/2020 with chest pain radiating to  his back associated shortness of breath and diaphoresis as well as reported some right leg weakness then quickly had bilateral lower extremity weakness as well as numbness in both hands.  Troponin unremarkable and chemistries within normal limits except glucose 229.  Chest x-ray no acute process.  CT angiogram of chest negative for aortic aneurysm or dissection.  No acute findings in the chest abdomen or pelvis.  MRI thoracic spine showed a 6 x 10 mm enhancing lesion left T9 vertebral body appeared nonspecific.  No cord compression or cause for myelopathy identified.  MRI cervical spine normal enhancement no mass lesion.  Lumbar puncture CSF cell count 1, protein 53, IgG index normal.  Cranial CT/MRI scan negative.  Neurology follow-up initial work-up for transverse myelitis however work-up had not been indicated of of that.  He was placed on high-dose Solu-Medrol x5 days starting 03/10/2020.  He completed course of steroids 03/14/2020.  Patient suddenly had acute shortness of breath respiratory distress around 5:45 PM on the evening of 03/15/2020.  Rapid response was called he was moved to the ICU.  Patient initially required nonrebreather.  Chest x-ray and abdominal films were done chest x-ray showed possibly infiltrates  or atelectasis.  MRI cervical spine T-spine repeated by neurology which showed progressive cord edema extending from C4-5 to T4-5 with mild patchiness seen.  Progression again favored myelitis as well as NMO came back positive with serum MOG antibody negative and serum paraneoplastic panel also pending.Marland Kitchen  He was again started on IVIG 03/16/2020 and course has been completed. Per neurology on 7/6, the patient's clinical picture leans towards NMO and he was started on Rituxan. He will have an additional infusion planned in 2 weeks.  He completed a course of Maxipime for HCAP .  During hospital course and work-up cardiology services consulted in regards to possible need for possibly A. fib being a  contributor to spinal cord infarction recommendations were for 30-day cardiac event monitor which should be arranged at discharge and no further recommendations were made. Pt is to admit to CIR on 03/25/20.         Past Medical History  Past Medical History:  Diagnosis Date   Allergy    CAD (coronary artery disease)    a. angioplasty of his RCA in 1990. b. bare metal stent placed in the RCA in 2000 followed by rotational atherectomy shortly after for stent restenosis. c. last cath was in 2012 showing stable moderate diffuse CAD. (70% mid LAD, 80% diagonal, 70% Ramus, 40% mid to distal RCA stent restenosis). d. Low risk nuc in 2015.   Diabetes mellitus    Diverticulosis    Elevated CK    Erectile dysfunction    Hemorrhoids    HTN (hypertension)    Hyperlipidemia    Hypertriglyceridemia    Malignant melanoma of left side of neck (HCC) 10/25/2018   Myocardial infarction (HCC)    Obesity    OSA (obstructive sleep apnea)    Persistent disorder of initiating or maintaining sleep    Personal history of colonic polyps 02/05/2003    Family History  family history includes COPD in his mother; Heart disease in his father and sister; Liver cancer in his father; Lung cancer in his father and mother; Obesity in his mother.  Prior Rehab/Hospitalizations:  Has the patient had prior rehab or hospitalizations prior to admission? No  Has the patient had major surgery during 100 days prior to admission? No  Current Medications   Current Facility-Administered Medications:    0.9 %  sodium chloride infusion, , Intravenous, PRN, Donne Hazel, MD, Stopped at 03/10/20 2249   acetaminophen (TYLENOL) tablet 650 mg, 650 mg, Oral, Q6H PRN, 650 mg at 03/10/20 2144 **OR** acetaminophen (TYLENOL) suppository 650 mg, 650 mg, Rectal, Q6H PRN, Wynetta Fines T, MD   aspirin EC tablet 81 mg, 81 mg, Oral, QHS, Wynetta Fines T, MD, 81 mg at 03/14/20 2149   docusate sodium (COLACE) capsule 100 mg, 100 mg, Oral, BID,  Pahwani, Ravi, MD, 100 mg at 03/15/20 0944   enoxaparin (LOVENOX) injection 50 mg, 50 mg, Subcutaneous, Q24H, Pahwani, Ravi, MD, 50 mg at 03/15/20 0940   fenofibrate tablet 160 mg, 160 mg, Oral, QHS, Wynetta Fines T, MD, 160 mg at 03/14/20 2149   fluticasone (FLONASE) 50 MCG/ACT nasal spray 2 spray, 2 spray, Each Nare, QHS, Lequita Halt, MD, 2 spray at 03/14/20 2150   icosapent Ethyl (VASCEPA) 1 g capsule 2 g, 2 g, Oral, BID, Wynetta Fines T, MD, 2 g at 03/15/20 0943   insulin aspart (novoLOG) injection 0-5 Units, 0-5 Units, Subcutaneous, QHS, Lequita Halt, MD, 3 Units at 03/14/20 2155   insulin aspart (novoLOG) injection 0-9  Units, 0-9 Units, Subcutaneous, TID WC, Lequita Halt, MD, 2 Units at 03/14/20 1729   insulin aspart (novoLOG) injection 25 Units, 25 Units, Subcutaneous, TID AC, Lequita Halt, MD, 25 Units at 03/13/20 1719   insulin glargine (LANTUS) injection 60 Units, 60 Units, Subcutaneous, QHS, Lequita Halt, MD, 60 Units at 03/14/20 2149   lidocaine (LIDODERM) 5 % 1 patch, 1 patch, Transdermal, Q24H, Wynetta Fines T, MD, 1 patch at 03/14/20 2158   LORazepam (ATIVAN) tablet 0.5 mg, 0.5 mg, Oral, Q4H PRN, Mansy, Jan A, MD, 0.5 mg at 03/15/20 1701   LORazepam (ATIVAN) tablet 1 mg, 1 mg, Oral, QHS PRN, Wynetta Fines T, MD, 1 mg at 03/15/20 0039   multivitamin with minerals tablet 1 tablet, 1 tablet, Oral, Daily, Wynetta Fines T, MD, 1 tablet at 03/15/20 0943   oxybutynin (DITROPAN) tablet 5 mg, 5 mg, Oral, BID, Wynetta Fines T, MD, 5 mg at 03/15/20 0944   pantoprazole (PROTONIX) EC tablet 40 mg, 40 mg, Oral, Daily, Aroor, Karena Addison R, MD, 40 mg at 03/15/20 0943   polyethylene glycol (MIRALAX / GLYCOLAX) packet 17 g, 17 g, Oral, Daily, Pahwani, Ravi, MD, 17 g at 03/15/20 0944   rosuvastatin (CRESTOR) tablet 40 mg, 40 mg, Oral, QHS, Wynetta Fines T, MD, 40 mg at 03/14/20 2148   sertraline (ZOLOFT) tablet 150 mg, 150 mg, Oral, QHS, Wynetta Fines T, MD, 150 mg at 03/14/20 2147   sodium chloride (OCEAN) 0.65 %  nasal spray 1 spray, 1 spray, Each Nare, PRN, Darliss Cheney, MD, 1 spray at 03/13/20 1054   traMADol (ULTRAM) tablet 50 mg, 50 mg, Oral, Q6H PRN, Donne Hazel, MD, 50 mg at 03/14/20 2148   vitamin B-12 (CYANOCOBALAMIN) tablet 500 mcg, 500 mcg, Oral, Daily, Wynetta Fines T, MD, 500 mcg at 03/15/20 0944   zolpidem (AMBIEN) tablet 10 mg, 10 mg, Oral, QHS, Wynetta Fines T, MD, 10 mg at 03/14/20 2148  Patients Current Diet:  Diet Order             Diet heart healthy/carb modified Room service appropriate? No; Fluid consistency: Thin  Diet effective now                   Precautions / Restrictions Precautions Precautions: Fall Precaution Comments: sensation around T6-9 Restrictions Weight Bearing Restrictions: No   Has the patient had 2 or more falls or a fall with injury in the past year?No  Prior Activity Level Community (5-7x/wk): retired from the post office, still driving, Independent with an AD PTA. lives with wife and 64 yo grandson   Prior Functional Level Prior Function Level of Independence: Independent  Self Care: Did the patient need help bathing, dressing, using the toilet or eating?  Independent  Indoor Mobility: Did the patient need assistance with walking from room to room (with or without device)? Independent  Stairs: Did the patient need assistance with internal or external stairs (with or without device)? Independent  Functional Cognition: Did the patient need help planning regular tasks such as shopping or remembering to take medications? Independent  Home Assistive Devices / Equipment Home Assistive Devices/Equipment: None Home Equipment: None  Prior Device Use: Indicate devices/aids used by the patient prior to current illness, exacerbation or injury? None of the above  Current Functional Level Cognition  Overall Cognitive Status: Within Functional Limits for tasks assessed Orientation Level: Oriented X4 General Comments: pt agreeable and  cooperative through session. Wife present and supportive    Extremity Assessment (includes  Sensation/Coordination)  Upper Extremity Assessment: RUE deficits/detail, LUE deficits/detail RUE Deficits / Details: strength 5/5 in shoulder, biceps, triceps. Able to moblize fingers slightly but no functional grasp. Sensation appears WFL LUE Deficits / Details: strength 5/5 shoulder, 3/5 tricep, 5/5 bicep. No functional hand movement noted  Lower Extremity Assessment: Defer to PT evaluation RLE Deficits / Details: Strength 0/5. Passive range of motion WFL but difficult due to extensor spasms RLE Sensation: decreased light touch, decreased proprioception, history of peripheral neuropathy LLE Deficits / Details: Strength 0/5. Passive range of motion WFL but difficult due to extensor spasms LLE Sensation: decreased light touch, decreased proprioception, history of peripheral neuropathy    ADLs  Overall ADL's : Needs assistance/impaired Eating/Feeding: Set up, Bed level Eating/Feeding Details (indicate cue type and reason): issued pt U- cuff. Was able to feed self fruit with fork with set up while in chair position in bed. Grooming: Minimal assistance, Bed level Grooming Details (indicate cue type and reason): educated pt on using u cuff for brushing teeth, as well as compensating with electric toothbrush Upper Body Bathing: Maximal assistance, Sitting, Bed level Lower Body Bathing: Total assistance, Sitting/lateral leans, Sit to/from stand, Bed level Upper Body Dressing : Sitting, Bed level, Maximal assistance Lower Body Dressing: Total assistance, Sitting/lateral leans, Sit to/from stand, Bed level Toilet Transfer: Total assistance Toilet Transfer Details (indicate cue type and reason): assumes full extensor tone when taken EOB Toileting- Clothing Manipulation and Hygiene: Total assistance General ADL Comments: session focused on assisting pt back to bed with lift and educating RN staff     Mobility  Overal bed mobility: Needs Assistance Bed Mobility: Rolling Rolling: Max assist Supine to sit: Total assist, +2 for physical assistance, +2 for safety/equipment Sit to supine: Total assist, +2 for physical assistance, +2 for safety/equipment General bed mobility comments: maxA to roll with pt using elbow to "hook" around OT arm or bed rail to assist in roll. unable to assist with movement of LE.    Transfers  Overall transfer level: Needs assistance Equipment used: Ambulation equipment used Transfer via Lift Equipment: Bruno transfer comment: maximove used to assist patient back to bed. BLEs placed in flexion to help break up tone    Ambulation / Gait / Stairs / Wheelchair Mobility  Ambulation/Gait Ambulation/Gait assistance:  (pt unable)    Posture / Balance Dynamic Sitting Balance Sitting balance - Comments: extensor tone limiting Balance Overall balance assessment: Needs assistance Sitting-balance support: Feet supported Sitting balance-Leahy Scale: Zero Sitting balance - Comments: extensor tone limiting    Special needs/care consideration BiPAP: order in acute Continuous Drip IV: cefepime Oxygen: on RA  Special Bed: gets chest physiotherapy in bed Skin: MASD to groin, scrotum (right; left)  Diabetic management: yes Special service needs: may need hoyer lift with RN staff and Designated visitor : wife [name]     Previous Environmental health practitioner (from acute therapy documentation) Living Arrangements: Spouse/significant other, Other relatives (32 y/o grandson) Available Help at Discharge: Family, Available 24 hours/day Type of Home: House Home Layout: Two level, 1/2 bath on main level, Bed/bath upstairs Alternate Level Stairs-Number of Steps: flight Home Access: Stairs to enter Entrance Stairs-Rails: None Entrance Stairs-Number of Steps: 2 Bathroom Shower/Tub: Chiropodist: Standard Home Care Services: No  Discharge Living  Setting Plans for Discharge Living Setting: House, Lives with (comment) (lives with wife and 29 yo grandson)  Type of Home at Discharge: House Discharge Home Layout: Two level, 1/2 bath on main level, Bed/bath upstairs Alternate Level Stairs-Rails:  None; pt will need to stay downstairs Alternate Level Stairs-Number of Steps: NA Discharge Home Access: Stairs to enter (discussed ramp needs) Entrance Stairs-Rails: None Entrance Stairs-Number of Steps: 2-3 Discharge Bathroom Shower/Tub: Tub/shower unit Discharge Bathroom Toilet: Standard Discharge Bathroom Accessibility: Yes How Accessible: Accessible via walker Does the patient have any problems obtaining your medications?: No  Social/Family/Support Systems Patient Roles: Spouse, Other (Comment) (parent to his 69 yo grandson) Sport and exercise psychologist Information: [name] (wife): [phone] cell; home: [phone] Anticipated Caregiver: wife Anticipated Caregiver's Contact Information: see above Ability/Limitations of Caregiver: Min A Caregiver Availability: 24/7 Discharge Plan Discussed with Primary Caregiver: Yes Is Caregiver In Agreement with Plan?: Yes Does Caregiver/Family have Issues with Lodging/Transportation while Pt is in Rehab?: No   Goals Patient/Family Goal for Rehab: PT/OT: Mod A SLP: NA Expected length of stay: 27-32 days  Pt/Family Agrees to Admission and willing to participate: Yes Program Orientation Provided & Reviewed with Pt/Caregiver Including Roles  & Responsibilities: Yes  Barriers to Discharge: Home environment access/layout, Neurogenic Bowel & Bladder, Lack of/limited family support  Barriers to Discharge Comments: steps to enter home; main bed/bath upstairs; new neurogenic b&b; pt understands SNF will need to be sought if he cannot progress to a level his wife can provide.    Decrease burden of Care through IP rehab admission: Specialzed equipment needs, Decrease number of caregivers, Bowel and bladder program and  Patient/family education   Possible need for SNF placement upon discharge: possibly; pt's wife is limited in how much physical assist she can provide at baseline. If pt does not progress to a Min A level, anticipate pt may need SNF placement. Pt and family aware.     Patient Condition: This patient's medical and functional status has changed since the consult dated 03/11/20 in which the Rehabilitation Physician determined and documented that the patient was potentially appropriate for intensive rehabilitative care in an inpatient rehabilitation facility. Issues have been addressed and update has been discussed with Dr. Posey Pronto and patient now appropriate for inpatient rehabilitation. Pt has a diagnosis that is amendable to CIR and his workup has been complete with nuerology strongly suspecting neuromyelitis optica. Will admit to inpatient rehab today.   Preadmission Screen Completed By:  Raechel Ache, OT, 03/15/2020 5:24 PM ______________________________________________________________________   Discussed status with Dr. Posey Pronto on 03/25/20 at 10:01AM and received approval for admission today.  Admission Coordinator:  Raechel Ache, time 10:01AM/Date 03/25/20.

## 2020-03-15 NOTE — Progress Notes (Signed)
[facility] Progress Note Patient Name: Brent [name]. DOB: 24-Nov-1945 MRN: [id number]   Date of Service  03/15/2020  HPI/Events of Note  Patient placed on BiPAP. No f/u ABG ordered. Now NPO on AC/HS insulin coverage.   eICU Interventions  Plan: 1. ABG now.  2. Change to Q 4 hour sensitive Novolog SSI coverage.      Intervention Category Major Interventions: Respiratory failure - evaluation and management  Lawrie Tunks Cornelia Copa 03/15/2020, 10:33 PM

## 2020-03-16 ENCOUNTER — Inpatient Hospital Stay (HOSPITAL_COMMUNITY): Payer: Medicare Other

## 2020-03-16 ENCOUNTER — Other Ambulatory Visit: Payer: Self-pay | Admitting: Physician Assistant

## 2020-03-16 DIAGNOSIS — G8220 Paraplegia, unspecified: Secondary | ICD-10-CM

## 2020-03-16 DIAGNOSIS — G9511 Acute infarction of spinal cord (embolic) (nonembolic): Secondary | ICD-10-CM

## 2020-03-16 LAB — BASIC METABOLIC PANEL
Anion gap: 10 (ref 5–15)
BUN: 33 mg/dL — ABNORMAL HIGH (ref 8–23)
CO2: 23 mmol/L (ref 22–32)
Calcium: 8.7 mg/dL — ABNORMAL LOW (ref 8.9–10.3)
Chloride: 106 mmol/L (ref 98–111)
Creatinine, Ser: 0.94 mg/dL (ref 0.61–1.24)
GFR calc Af Amer: >60 mL/min (ref >60)
GFR calc non Af Amer: >60 mL/min (ref >60)
Glucose, Bld: 66 mg/dL — ABNORMAL LOW (ref 70–99)
Potassium: 4.3 mmol/L (ref 3.5–5.1)
Sodium: 139 mmol/L (ref 135–145)

## 2020-03-16 LAB — CBC WITH DIFFERENTIAL/PLATELET
Abs Immature Granulocytes: 0.05 10*3/uL (ref 0.00–0.07)
Basophils Absolute: 0 10*3/uL (ref 0.0–0.1)
Basophils Relative: 0 %
Eosinophils Absolute: 0 10*3/uL (ref 0.0–0.5)
Eosinophils Relative: 0 %
HCT: 41.5 % (ref 39.0–52.0)
Hemoglobin: 13.5 g/dL (ref 13.0–17.0)
Immature Granulocytes: 1 %
Lymphocytes Relative: 20 %
Lymphs Abs: 1.9 10*3/uL (ref 0.7–4.0)
MCH: 29.3 pg (ref 26.0–34.0)
MCHC: 32.5 g/dL (ref 30.0–36.0)
MCV: 90 fL (ref 80.0–100.0)
Monocytes Absolute: 0.5 10*3/uL (ref 0.1–1.0)
Monocytes Relative: 5 %
Neutro Abs: 7 10*3/uL (ref 1.7–7.7)
Neutrophils Relative %: 74 %
Platelets: 210 10*3/uL (ref 150–400)
RBC: 4.61 MIL/uL (ref 4.22–5.81)
RDW: 13.6 % (ref 11.5–15.5)
WBC: 9.5 10*3/uL (ref 4.0–10.5)
nRBC: 0 % (ref 0.0–0.2)

## 2020-03-16 LAB — NEUROMYELITIS OPTICA AUTOAB, IGG: NMO-IgG: 6.5 U/mL — ABNORMAL HIGH (ref 0.0–3.0)

## 2020-03-16 LAB — MRSA PCR SCREENING: MRSA by PCR: NEGATIVE

## 2020-03-16 LAB — GLUCOSE, CAPILLARY
Glucose-Capillary: 100 mg/dL — ABNORMAL HIGH (ref 70–99)
Glucose-Capillary: 111 mg/dL — ABNORMAL HIGH (ref 70–99)
Glucose-Capillary: 135 mg/dL — ABNORMAL HIGH (ref 70–99)
Glucose-Capillary: 139 mg/dL — ABNORMAL HIGH (ref 70–99)
Glucose-Capillary: 222 mg/dL — ABNORMAL HIGH (ref 70–99)
Glucose-Capillary: 50 mg/dL — ABNORMAL LOW (ref 70–99)
Glucose-Capillary: 68 mg/dL — ABNORMAL LOW (ref 70–99)

## 2020-03-16 LAB — PHOSPHORUS: Phosphorus: 4 mg/dL (ref 2.5–4.6)

## 2020-03-16 LAB — MAGNESIUM: Magnesium: 2.2 mg/dL (ref 1.7–2.4)

## 2020-03-16 LAB — PROCALCITONIN: Procalcitonin: 0.16 ng/mL

## 2020-03-16 MED ORDER — BISACODYL 10 MG RE SUPP
10.0000 mg | Freq: Once | RECTAL | Status: AC
  Administered 2020-03-16: 10 mg via RECTAL

## 2020-03-16 MED ORDER — DEXTROSE 50 % IV SOLN
INTRAVENOUS | Status: AC
  Administered 2020-03-16: 50 mL
  Filled 2020-03-16: qty 50

## 2020-03-16 MED ORDER — WHITE PETROLATUM EX OINT
TOPICAL_OINTMENT | CUTANEOUS | Status: AC
  Administered 2020-03-16: 0.2
  Filled 2020-03-16: qty 28.35

## 2020-03-16 MED ORDER — ONDANSETRON HCL 4 MG/2ML IJ SOLN
4.0000 mg | Freq: Four times a day (QID) | INTRAMUSCULAR | Status: DC | PRN
  Administered 2020-03-16: 4 mg via INTRAVENOUS
  Filled 2020-03-16: qty 2

## 2020-03-16 MED ORDER — IMMUNE GLOBULIN (HUMAN) 10 GM/100ML IV SOLN
400.0000 mg/kg | INTRAVENOUS | Status: AC
  Administered 2020-03-16 – 2020-03-20 (×5): 45 g via INTRAVENOUS
  Filled 2020-03-16: qty 50
  Filled 2020-03-16 (×2): qty 400
  Filled 2020-03-16: qty 50
  Filled 2020-03-16: qty 400

## 2020-03-16 MED ORDER — METOCLOPRAMIDE HCL 5 MG/ML IJ SOLN
10.0000 mg | Freq: Four times a day (QID) | INTRAMUSCULAR | Status: AC
  Administered 2020-03-16 – 2020-03-17 (×6): 10 mg via INTRAVENOUS
  Filled 2020-03-16 (×6): qty 2

## 2020-03-16 MED ORDER — FLEET ENEMA 7-19 GM/118ML RE ENEM
1.0000 | ENEMA | Freq: Every day | RECTAL | Status: DC | PRN
  Administered 2020-03-18: 1 via RECTAL
  Filled 2020-03-16: qty 1

## 2020-03-16 MED ORDER — FUROSEMIDE 10 MG/ML IJ SOLN
40.0000 mg | Freq: Once | INTRAMUSCULAR | Status: AC
  Administered 2020-03-16: 40 mg via INTRAVENOUS
  Filled 2020-03-16: qty 4

## 2020-03-16 MED ORDER — GADOBUTROL 1 MMOL/ML IV SOLN
10.0000 mL | Freq: Once | INTRAVENOUS | Status: AC | PRN
  Administered 2020-03-16: 10 mL via INTRAVENOUS

## 2020-03-16 MED ORDER — ALPRAZOLAM 0.5 MG PO TABS: 0.5000 mg | ORAL_TABLET | Freq: Once | ORAL | Status: DC | PRN

## 2020-03-16 MED ORDER — DEXTROSE 10 % IV SOLN: INTRAVENOUS | Status: DC

## 2020-03-16 NOTE — Progress Notes (Signed)
Inpatient Rehabilitation-Admissions Coordinator   [facility] continues to follow for completion of medical workup.   Raechel Ache, OTR/L  Rehab Admissions Coordinator  [phone] 03/16/2020 1:44 PM

## 2020-03-16 NOTE — Progress Notes (Signed)
PROGRESS NOTE    Brent [name].  LYY:[id number] DOB: 1945-10-12 DOA: 03/08/2020 PCP: [name], [name], MD    Brief Narrative:  74 y.o. male with medical history significant of remote MI stenting x3 in 1990s, CAD, IDDM, DM neuropathy, sciatica with injection treatment 2 years ago, HTN, HLD, came with new onset of chest pain which started when patient was in the church and standing.  Pressure-like 8-9 over 10, associated with feeling of nausea and sweating no vomiting no short of breath no palpitations.  EMS arrived and gave patient some aspirin nitroglycerin sublingual with little improvement.  Then upon arrival in the ED the chest pain slowly progressed to radiate to the left shoulder and his back, then slowly subsided.  While staying in the ED patient started to feel weakness of the whole right-sided leg, he remember that while lying on the stretcher in the ambulance, the stretcher had a bump right beneath his right hip area, where he had " bad inflamed nerve which received injection about 2 years ago in [place]".   He denieD any numbness or loss of feeling but only weakness from hip to toe on the right side.  He denied baseline claudication, he does have chronic back pain on and off.  Patient also admitted he has a baseline diabetic neuropathy with frequent on and off tingling sensation of toes and fingertips. Upon arrival to ED, troponin II sets negative, CTA dissection study negative.  Patient was admitted to hospital service.  Cardiology was consulted.  He underwent nuclear stress test which was unremarkable.  He then started having weakness in left lower extremity as well as decreased grip strength in bilateral hands.  Neurology was consulted. C Spine MRI was ordered which was without any remarkable acute findings per official radiology report however per neurology MRI spine shows subtle signal change on cervical, thoracic spine and he had discussed with neuroradiology as well.  T-spine MRI  showed 6 x 10 mm enhancing lesion left T9 vertebral body, low signal on T1 and high signal on T2.  CT head was unremarkable.  LP was also unremarkable.  His initial working diagnosis was transverse myelitis however the work-up has not been indicative of that.  Patient was started on high-dose Solu-Medrol 1000 mg IV daily for 5 days starting 03/10/2020.  Patient was stable but was not progressing as far as the weakness goes.  He completed his high-dose Solu-Medrol on 03/14/2020.  Patient suddenly had acute shortness of breath/respiratory distress at around 5:45 PM on the evening of 03/15/2020.  Rapid response was called.  Patient was moved to ICU.  PCCM was consulted.  Patient initially required nonrebreather.  Chest x-ray and abdominal x-ray were done.  Chest x-ray shows possibility of infiltrates or atelectasis.  MRI C-spine and T-spine was repeated by neurology on 03/08/2020 which showed progressive cord edema extending from C4-5 to T4-5 with mild patchiness been.  Progression favors myelitis.   Assessment & Plan:   Active Problems:   Chest pain   Weakness   Quadriplegia (HCC)   Coronary artery disease involving native coronary artery of native heart without angina pectoris   Steroid-induced hyperglycemia   Diabetic peripheral neuropathy (HCC)   Tachypnea   Hyponatremia   AKI (acute kidney injury) ([place])   Acute hypoxemic respiratory failure (HCC)  Chest pain -Pt has significant prior cardiac history -Given risk factors, Cardiology consulted and pt underwent nuclear stress test on 6/21, reviewed, with no reversible ischemia or infarct. LVEF of 53%,  normal LV wall motion. Low risk study -Continue his cardiac meds as tolerated  Progressive BLE weakness, numbness, loss of B hand grip strength  -new findings since initial presentation -Symptoms initially with RLE, since progressing to involve BLE weakness and B grip strength.  -Appreciate assistance by Neurology.  C Spine MRI was ordered which was  without any remarkable acute findings per official radiology report however per neurology MRI spine shows subtle signal change on cervical, thoracic spine and he had discussed with neuroradiology as well.  T-spine MRI showed 6 x 10 mm enhancing lesion left T9 vertebral body, low signal on T1 and high signal on T2.  CT head was unremarkable.  LP was also unremarkable.  He was started on high-dose Solu-Medrol for 5 days with first day starting on 03/10/2020.  The work-up so far has not been indicative of transverse myelitis but this along with possible spinal infarct is so far his working diagnosis per neurology and he is getting Solu-Medrol empirically for that.  He has not noted any improvement in his weakness since started on Solu-Medrol.  He is going to receive his last dose today.Dr. Lorraine Lax discussed case with [facility] neuro immunologist on 03/15/2020 who had declined transfer and opined that there is nothing further that they have to offer and agreed with the treatment that patient is receiving here.  Due to consideration of possible infarct, I discussed with Dr. Lorraine Lax about possibility of arranging loop recorder for this patient.  He agrees with that plan.  We have not seen any atrial fibrillation on telemetry here.  I consulted cardiology on 03/15/2020 who planned to do a event monitor for 30 days in near future.  Patient had repeat MRI of the C and T-spine today ordered by neurology which shows progressive cord edema extending from C4-5 to T4-5 with mild patchiness been.  Progression favors myelitis.  Discussed with Dr. Cheral Marker over the phone who stated that they are likely going to do plasma exchange but he needs to talk to patient and get consent from him.  Appreciate neurology help.  Acute hypoxic respiratory failure/bilateral atelectasis/neuromuscular weakness of unknown origin: Events from last night noted.  Patient suddenly had acute shortness of breath/respiratory distress at around 5:45 PM on the evening of  03/15/2020.  Rapid response was called.  Patient was moved to ICU.  PCCM was consulted.  Patient initially required nonrebreather.  Chest x-ray and abdominal x-ray were done.  Chest x-ray shows possibility of infiltrates or atelectasis.  Patient has remained afebrile without any leukocytosis.  This was likely secondary to distended abdomen secondary to constipation might have caused atelectasis.  Procalcitonin unremarkable.  No indication of antibiotics.  PCCM managing and they also do not favor antibiotics.  Nightly mandatory BiPAP and daytime as needed BiPAP.  Pulmonary hygiene.  Currently he is on 2 L of nasal oxygen and saturating 96%.  No shortness of breath.  IDDM/type 2 diabetes mellitus Patient had hypoglycemia early this morning prompted dextrose fluids started.  Patient was on 60 units of Lantus however this was not given yesterday.  I will discontinue Lantus now that he is on dextrose to keep his sugars up.  Continue SSI.  Essential HTN -Continue home meds -BP stable and controlled at this time  HLD -Continue with statin as tolerated  Obesity: -Recommend diet/lifestyle modification  Hx malignant melanoma -chart reviewed. Pt s/p biopsy on 10/19, followed by [facility] -Seems to be stable currently  AKI/dehydration: Came in with normal GFR.  Creatinine jumped to  1.74.  AKI has resolved.   Constipation: Did not have a bowel movement yesterday or today.  Abdomen is still slightly distended but nontender.  I have ordered MiraLAX and Dulcolax suppository.  If no bowel movement in the next 6 hours then will use Fleet enema.  DVT prophylaxis: Place and maintain sequential compression device Start: 03/09/20 1849 And Lovenox Code Status: Full Family Communication: Pt in room, family currently not at bedside  Status is: Inpatient  Remains inpatient appropriate because:Ongoing diagnostic testing needed not appropriate for outpatient work up and Inpatient level of care appropriate due to  severity of illness  Dispo: The patient is from: Home              Anticipated d/c is to: CIR              Anticipated d/c date is:1-2 days              Patient currently is not medically stable to d/c.     Consultants:   Cardiology  Neurology  Procedures:   Stress test 6/21  Antimicrobials: Anti-infectives (From admission, onward)   None      Subjective: Patient seen and examined.  No new complaint but no improvement either.  Objective: Vitals:   03/16/20 1041 03/16/20 1100 03/16/20 1113 03/16/20 1200  BP:  (!) 124/48  (!) 130/43  Pulse: 60 62  65  Resp: (!) 28 (!) 24  (!) 21  Temp:   98.2 F (36.8 C)   TempSrc:   Axillary   SpO2: 94% 95%  95%  Weight:      Height:        Intake/Output Summary (Last 24 hours) at 03/16/2020 1232 Last data filed at 03/16/2020 1200 Gross per 24 hour  Intake 960.52 ml  Output 3505 ml  Net -2544.48 ml   Filed Weights   03/08/20 2044 03/12/20 0619 03/15/20 1905  Weight: 98.4 kg 102.7 kg 110.3 kg    Examination:  General exam: Appears calm and comfortable, obese Respiratory system: Diminished breath sounds with poor inspiratory effort Cardiovascular system: S1 & S2 heard, RRR. No JVD, murmurs, rubs, gallops or clicks. No pedal edema. Gastrointestinal system: Abdomen is nondistended, soft and nontender. No organomegaly or masses felt. Normal bowel sounds heard. Central nervous system: Alert and oriented.  0/5 power in bilateral lower extremities.  Poor/weak bilateral handgrip. Psychiatry: Judgement and insight appear normal. Mood & affect appropriate.   Data Reviewed: I have personally reviewed following labs and imaging studies  CBC: Recent Labs  Lab 03/12/20 0339 03/12/20 0339 03/13/20 0403 03/14/20 0406 03/15/20 0138 03/15/20 2305 03/16/20 0215  WBC 10.9*  --  10.9* 11.3* 10.4  --  9.5  NEUTROABS 7.9*  --  9.2* 7.9* 7.5  --  7.0  HGB 12.9*   < > 12.5* 12.6* 13.6 12.9* 13.5  HCT 39.2   < > 38.2* 39.6 42.8 38.0*  41.5  MCV 88.9  --  89.9 90.8 91.5  --  90.0  PLT 183  --  199 188 218  --  210   < > = values in this interval not displayed.   Basic Metabolic Panel: Recent Labs  Lab 03/12/20 0339 03/12/20 0339 03/13/20 0403 03/14/20 0406 03/15/20 0138 03/15/20 2305 03/16/20 0215  NA 133*   < > 138 139 137 140 139  K 4.1   < > 4.6 4.8 4.8 4.5 4.3  CL 103  --  108 110 107  --  106  CO2  17*  --  20* 20* 21*  --  23  GLUCOSE 235*  --  114* 98 172*  --  66*  BUN 77*  --  63* 46* 36*  --  33*  CREATININE 1.64*  --  1.32* 1.06 0.99  --  0.94  CALCIUM 8.6*  --  8.5* 8.4* 8.5*  --  8.7*  MG  --   --   --   --   --   --  2.2  PHOS  --   --   --   --   --   --  4.0   < > = values in this interval not displayed.   GFR: Estimated Creatinine Clearance: 87.1 mL/min (by C-G formula based on SCr of 0.94 mg/dL). Liver Function Tests: Recent Labs  Lab 03/10/20 0319 03/10/20 1757 03/11/20 0222  AST 29  --  32  ALT 22  --  24  ALKPHOS 44  --  45  BILITOT 0.6  --  0.6  PROT 6.2*  --  6.3*  ALBUMIN 3.7 4.2 3.6   No results for input(s): LIPASE, AMYLASE in the last 168 hours. No results for input(s): AMMONIA in the last 168 hours. Coagulation Profile: No results for input(s): INR, PROTIME in the last 168 hours. Cardiac Enzymes: Recent Labs  Lab 03/15/20 1921  CKTOTAL 119   BNP (last 3 results) No results for input(s): PROBNP in the last 8760 hours. HbA1C: No results for input(s): HGBA1C in the last 72 hours. CBG: Recent Labs  Lab 03/15/20 2335 03/16/20 0324 03/16/20 0413 03/16/20 0806 03/16/20 1112  GLUCAP 90 50* 100* 68* 111*   Lipid Profile: No results for input(s): CHOL, HDL, LDLCALC, TRIG, CHOLHDL, LDLDIRECT in the last 72 hours. Thyroid Function Tests: Recent Labs    03/15/20 1921  TSH 2.300   Anemia Panel: Recent Labs    03/15/20 1921  VITAMINB12 814   Sepsis Labs: Recent Labs  Lab 03/15/20 1921 03/16/20 0215  PROCALCITON <0.10 0.16  LATICACIDVEN 0.9  --      Recent Results (from the past 240 hour(s))  SARS Coronavirus 2 by RT PCR (hospital order, performed in [facility] hospital lab) Nasopharyngeal Nasopharyngeal Swab     Status: None   Collection Time: 03/08/20 11:33 AM   Specimen: Nasopharyngeal Swab  Result Value Ref Range Status   SARS Coronavirus 2 NEGATIVE NEGATIVE Final    Comment: (NOTE) SARS-CoV-2 target nucleic acids are NOT DETECTED.  The SARS-CoV-2 RNA is generally detectable in upper and lower respiratory specimens during the acute phase of infection. The lowest concentration of SARS-CoV-2 viral copies this assay can detect is 250 copies / mL. A negative result does not preclude SARS-CoV-2 infection and should not be used as the sole basis for treatment or other patient management decisions.  A negative result may occur with improper specimen collection / handling, submission of specimen other than nasopharyngeal swab, presence of viral mutation(s) within the areas targeted by this assay, and inadequate number of viral copies (<250 copies / mL). A negative result must be combined with clinical observations, patient history, and epidemiological information.  Fact Sheet for Patients:   StrictlyIdeas.no  Fact Sheet for Healthcare Providers: BankingDealers.co.za  This test is not yet approved or  cleared by the [place] FDA and has been authorized for detection and/or diagnosis of SARS-CoV-2 by FDA under an Emergency Use Authorization (EUA).  This EUA will remain in effect (meaning this test can be used) for the duration  of the COVID-19 declaration under Section 564(b)(1) of the Act, 21 U.S.C. section 360bbb-3(b)(1), unless the authorization is terminated or revoked sooner.  Performed at [facility], [place] [address] [place], [place] [address]   Gram stain     Status: None   Collection Time: 03/10/20  5:15 PM   Specimen: PATH Cytology CSF; Cerebrospinal Fluid   Result Value Ref Range Status   Specimen Description CSF  Final   Special Requests NONE  Final   Gram Stain   Final    WBC PRESENT, PREDOMINANTLY PMN NO ORGANISMS SEEN CYTOSPIN SMEAR Performed at [facility], [place] [address], [place], [place] [address]    Report Status 03/10/2020 FINAL  Final     Radiology Studies: DG Chest 1 View  Result Date: 03/16/2020 CLINICAL DATA:  Respiratory failure EXAM: CHEST  1 VIEW COMPARISON:  There yesterday FINDINGS: Patchy interstitial and airspace opacity with mild improvement in aeration. As before, this could be infection or atypical edema. No effusion or pneumothorax. Stable cardiomediastinal contours which are distorted by rightward rotation. IMPRESSION: Mildly improved aeration. Electronically Signed   By: Monte Fantasia M.D.   On: 03/16/2020 05:30   MR CERVICAL SPINE W WO CONTRAST  Result Date: 03/16/2020 CLINICAL DATA:  Demyelinating disease EXAM: MRI CERVICAL AND THORACIC SPINE WITHOUT AND WITH CONTRAST TECHNIQUE: Multisequence MR imaging of the CERVICAL AND THORACIC SPINE was performed prior to and following IV contrast administration. CONTRAST:  67mL GADAVIST GADOBUTROL 1 MMOL/ML IV SOLN COMPARISON:  03/09/2020 FINDINGS: MRI CERVICAL SPINE FINDINGS Alignment: Physiologic Vertebrae: No fracture, evidence of discitis, or bone lesion. No evidence of altered marrow enhancement. Cord: Progressive T2 hyperintensity within the central cord extending from C4-5 to T4-5, with mild cord expansion and subtle patchy enhancement at the cervical levels. No cord compression or canal collection. No abnormal vessels over the cord. Posterior Fossa, vertebral arteries, paraspinal tissues: No contributory finding Disc levels: Ordinary degenerative changes.  No cord compression. MRI THORACIC SPINE FINDINGS Alignment:  Mild exaggerated thoracic kyphosis. Vertebrae: No fracture, evidence of discitis, or bone lesion. No abnormal vertebral enhancement. Cord:  T2  hyperintensity in the central cord as noted above. Paraspinal and other soft tissues: Small pleural effusions. Mild dependent atelectasis. Disc levels: Spondylosis which is likely bridging at multiple levels. IMPRESSION: Progressive cord edema extending from C4-5 to T4-5 with mild patchy enhancement. Transverse myelitis and cord infarct are the primary considerations, with progression favoring myelitis. Electronically Signed   By: Monte Fantasia M.D.   On: 03/16/2020 10:45   MR THORACIC SPINE W WO CONTRAST  Result Date: 03/16/2020 CLINICAL DATA:  Demyelinating disease EXAM: MRI CERVICAL AND THORACIC SPINE WITHOUT AND WITH CONTRAST TECHNIQUE: Multisequence MR imaging of the CERVICAL AND THORACIC SPINE was performed prior to and following IV contrast administration. CONTRAST:  63mL GADAVIST GADOBUTROL 1 MMOL/ML IV SOLN COMPARISON:  03/09/2020 FINDINGS: MRI CERVICAL SPINE FINDINGS Alignment: Physiologic Vertebrae: No fracture, evidence of discitis, or bone lesion. No evidence of altered marrow enhancement. Cord: Progressive T2 hyperintensity within the central cord extending from C4-5 to T4-5, with mild cord expansion and subtle patchy enhancement at the cervical levels. No cord compression or canal collection. No abnormal vessels over the cord. Posterior Fossa, vertebral arteries, paraspinal tissues: No contributory finding Disc levels: Ordinary degenerative changes.  No cord compression. MRI THORACIC SPINE FINDINGS Alignment:  Mild exaggerated thoracic kyphosis. Vertebrae: No fracture, evidence of discitis, or bone lesion. No abnormal vertebral enhancement. Cord:  T2 hyperintensity in the central cord  as noted above. Paraspinal and other soft tissues: Small pleural effusions. Mild dependent atelectasis. Disc levels: Spondylosis which is likely bridging at multiple levels. IMPRESSION: Progressive cord edema extending from C4-5 to T4-5 with mild patchy enhancement. Transverse myelitis and cord infarct are the  primary considerations, with progression favoring myelitis. Electronically Signed   By: Monte Fantasia M.D.   On: 03/16/2020 10:45   DG CHEST PORT 1 VIEW  Result Date: 03/15/2020 CLINICAL DATA:  Shortness of breath EXAM: PORTABLE CHEST 1 VIEW COMPARISON:  03/08/2020 FINDINGS: Airspace disease throughout the lungs bilaterally, right greater than left. Mild cardiomegaly. No visible effusions. No acute bony abnormality. IMPRESSION: Patchy bilateral airspace disease, right greater than left with cardiomegaly. Findings could reflect asymmetric edema or infection. Electronically Signed   By: Rolm Baptise M.D.   On: 03/15/2020 18:43   DG Abd Portable 1V  Result Date: 03/15/2020 CLINICAL DATA:  Shortness of breath EXAM: PORTABLE ABDOMEN - 1 VIEW COMPARISON:  CT 03/08/2020 FINDINGS: Nonobstructive bowel gas pattern. Prior cholecystectomy. No organomegaly or free air. Hyperdense area seen in the left upper quadrant corresponding to the layering high-density material/calcifications within the left renal cyst. Diffuse airspace disease throughout the lungs. IMPRESSION: No evidence of bowel obstruction or free air. Layering calcifications within the left renal cyst as seen on prior CT. Diffuse airspace disease throughout the lungs. Electronically Signed   By: Rolm Baptise M.D.   On: 03/15/2020 18:46    Scheduled Meds: . aspirin EC  81 mg Oral QHS  . bisacodyl  10 mg Rectal Once  . chlorhexidine  15 mL Mouth Rinse BID  . Chlorhexidine Gluconate Cloth  6 each Topical Q0600  . docusate sodium  100 mg Oral BID  . enoxaparin (LOVENOX) injection  50 mg Subcutaneous Q24H  . fenofibrate  160 mg Oral QHS  . fluticasone  2 spray Each Nare QHS  . icosapent Ethyl  2 g Oral BID  . insulin aspart  0-9 Units Subcutaneous Q4H  . lidocaine  1 patch Transdermal Q24H  . mouth rinse  15 mL Mouth Rinse q12n4p  . metoCLOPramide (REGLAN) injection  10 mg Intravenous Q6H  . multivitamin with minerals  1 tablet Oral Daily  .  nystatin  1 application Topical TID  . oxybutynin  5 mg Oral BID  . pantoprazole  40 mg Oral Daily  . polyethylene glycol  17 g Oral Daily  . rosuvastatin  40 mg Oral QHS  . sertraline  150 mg Oral QHS  . vitamin B-12  500 mcg Oral Daily  . zolpidem  5 mg Oral QHS   Continuous Infusions: . sodium chloride 10 mL/hr at 03/16/20 1200  . dextrose Stopped (03/16/20 1103)     LOS: 7 days   Darliss Cheney, MD [facility] Pager On Amion  If 7PM-7AM, please contact night-coverage 03/16/2020, 12:32 PM

## 2020-03-16 NOTE — Progress Notes (Signed)
[facility] Progress Note Patient Name: Brent [name]. DOB: [birth year] MRN: [id number]   Date of Service  03/16/2020  HPI/Events of Note  RN requests anti-emetic.  eICU Interventions  Zofran ordered.     Intervention Category Minor Interventions: Other:  Charlott Rakes 03/16/2020, 8:43 PM

## 2020-03-16 NOTE — Progress Notes (Addendum)
Subjective: No improvement. LUE is weaker than RUE. BLE continue to have no volitional movement and hyperreflexia.   Objective: Current vital signs: BP (!) 123/53   Pulse 63   Temp 98.2 F (36.8 C) (Axillary)   Resp (!) 25   Ht 6' (1.829 m)   Wt 110.3 kg   SpO2 95%   BMI 32.98 kg/m  Vital signs in last 24 hours: Temp:  [97.6 F (36.4 C)-98.6 F (37 C)] 98.2 F (36.8 C) (06/28 1113) Pulse Rate:  [53-75] 63 (06/28 1400) Resp:  [11-37] 25 (06/28 1400) BP: (95-171)/(43-90) 123/53 (06/28 1400) SpO2:  [90 %-98 %] 95 % (06/28 1400) Weight:  [110.3 kg] 110.3 kg (06/27 1905)  Intake/Output from previous day: 06/27 0701 - 06/28 0700 In: 1012.4 [P.O.:850; I.V.:162.4] Out: 3155 [Urine:3155] Intake/Output this shift: Total I/O In: 448.1 [P.O.:360; I.V.:88.1] Out: 1625 [Urine:1625] Nutritional status:  Diet Order            Diet Carb Modified Fluid consistency: Thin; Room service appropriate? Yes  Diet effective now                HEENT: [place]/AT Lungs: No tachypnea or accessory muscles of respiration noted.  Ext: Non-cyanotic  Neurologic Exam: Mental Status: Alert, oriented, thought content appropriate.  Speech fluent without evidence of aphasia.  Able to follow all commands without difficulty. Cranial Nerves: II:  Tracks and fixates normally. PERRL.  III,IV, VI: EOMI V,VII: Smile symmetric, facial temp sensation equal bilaterally VIII: hearing intact to voice IX,X: No hypophonia XI: Neck flexion 5/5.  XII: midline tongue extension  Motor: Neck extension 5/5 RUE: 4+/5 deltoid, biceps, triceps and wrist flexion. 4/5 wrist extension. 2/5 grip. 0/5 finger abduction. Can wiggle fingers weakly.  LUE: 4-/5 deltoid, biceps, triceps. 3/5 wrist flexion. 1/5 wrist extension. 0/5 grip. 0/5 finger abduction. 0/5 finger extension.   BLE: 0/5 proximally and distally. There is reflexive dorsiflexion of great toe and ankle in response to plantar stimulation bilaterally.  Sensory:  Decreased but present temp sensation bilateral lower extremities. Bilateral upper extremities with subjectively normal temp sensation.  Deep Tendon Reflexes:  3+ low ampliltude triceps, biceps and brachioradialis bilaterally 4+ patellars bilaterally (crossed adductors) 2+ achilles bilaterally Toes upgoing bilaterally Cerebellar: No ataxia noted in upper extremities.  Gait: Unable to assess   Lab Results: Results for orders placed or performed during the hospital encounter of 03/08/20 (from the past 48 hour(s))  Glucose, capillary     Status: Abnormal   Collection Time: 03/14/20  4:21 PM  Result Value Ref Range   Glucose-Capillary 156 (H) 70 - 99 mg/dL    Comment: Glucose reference range applies only to samples taken after fasting for at least 8 hours.   Comment 1 Notify RN   Glucose, capillary     Status: Abnormal   Collection Time: 03/14/20  9:12 PM  Result Value Ref Range   Glucose-Capillary 258 (H) 70 - 99 mg/dL    Comment: Glucose reference range applies only to samples taken after fasting for at least 8 hours.  CBC with Differential/Platelet     Status: None   Collection Time: 03/15/20  1:38 AM  Result Value Ref Range   WBC 10.4 4.0 - 10.5 K/uL   RBC 4.68 4.22 - 5.81 MIL/uL   Hemoglobin 13.6 13.0 - 17.0 g/dL   HCT 42.8 39 - 52 %   MCV 91.5 80.0 - 100.0 fL   MCH 29.1 26.0 - 34.0 pg   MCHC 31.8 30.0 - 36.0 g/dL  RDW 13.6 11.5 - 15.5 %   Platelets 218 150 - 400 K/uL   nRBC 0.0 0.0 - 0.2 %   Neutrophils Relative % 72 %   Neutro Abs 7.5 1.7 - 7.7 K/uL   Lymphocytes Relative 22 %   Lymphs Abs 2.3 0.7 - 4.0 K/uL   Monocytes Relative 5 %   Monocytes Absolute 0.5 0 - 1 K/uL   Eosinophils Relative 0 %   Eosinophils Absolute 0.0 0 - 0 K/uL   Basophils Relative 0 %   Basophils Absolute 0.0 0 - 0 K/uL   Immature Granulocytes 1 %   Abs Immature Granulocytes 0.06 0.00 - 0.07 K/uL    Comment: Performed at [facility] [address] [place], [place] [address]  Basic  metabolic panel     Status: Abnormal   Collection Time: 03/15/20  1:38 AM  Result Value Ref Range   Sodium 137 135 - 145 mmol/L   Potassium 4.8 3.5 - 5.1 mmol/L   Chloride 107 98 - 111 mmol/L   CO2 21 (L) 22 - 32 mmol/L   Glucose, Bld 172 (H) 70 - 99 mg/dL    Comment: Glucose reference range applies only to samples taken after fasting for at least 8 hours.   BUN 36 (H) 8 - 23 mg/dL   Creatinine, Ser 0.99 0.61 - 1.24 mg/dL   Calcium 8.5 (L) 8.9 - 10.3 mg/dL   GFR calc non Af Amer >60 >60 mL/min   GFR calc Af Amer >60 >60 mL/min   Anion gap 9 5 - 15    Comment: Performed at [facility] [address] [place], [place] [address]  Glucose, capillary     Status: None   Collection Time: 03/15/20  6:27 AM  Result Value Ref Range   Glucose-Capillary 91 70 - 99 mg/dL    Comment: Glucose reference range applies only to samples taken after fasting for at least 8 hours.  Glucose, capillary     Status: None   Collection Time: 03/15/20 12:53 PM  Result Value Ref Range   Glucose-Capillary 74 70 - 99 mg/dL    Comment: Glucose reference range applies only to samples taken after fasting for at least 8 hours.  Glucose, capillary     Status: None   Collection Time: 03/15/20  5:17 PM  Result Value Ref Range   Glucose-Capillary 72 70 - 99 mg/dL    Comment: Glucose reference range applies only to samples taken after fasting for at least 8 hours.  CK     Status: None   Collection Time: 03/15/20  7:21 PM  Result Value Ref Range   Total CK 119 49.0 - 397.0 U/L    Comment: Performed at [facility], [place] [address] [place], [place] [address]  Lactic acid, plasma     Status: None   Collection Time: 03/15/20  7:21 PM  Result Value Ref Range   Lactic Acid, Venous 0.9 0.5 - 1.9 mmol/L    Comment: Performed at [facility] [address], [place], [place] [address]  TSH     Status: None   Collection Time: 03/15/20  7:21 PM  Result Value Ref Range   TSH 2.300 0.350 - 4.500 uIU/mL     Comment: Performed by a 3rd Generation assay with a functional sensitivity of <=0.01 uIU/mL. Performed at [facility], [place] [address], [place], [place] [address]   Vitamin B12     Status:  None   Collection Time: 03/15/20  7:21 PM  Result Value Ref Range   Vitamin B-12 814 180 - 914 pg/mL    Comment: (NOTE) This assay is not validated for testing neonatal or myeloproliferative syndrome specimens for Vitamin B12 levels. Performed at [facility], [place] [address] [place], [place] [address]   Procalcitonin - Baseline     Status: None   Collection Time: 03/15/20  7:21 PM  Result Value Ref Range   Procalcitonin <0.10 ng/mL    Comment:        Interpretation: PCT (Procalcitonin) <= 0.5 ng/mL: Systemic infection (sepsis) is not likely. Local bacterial infection is possible. (NOTE)       Sepsis PCT Algorithm           Lower Respiratory Tract                                      Infection PCT Algorithm    ----------------------------     ----------------------------         PCT < 0.25 ng/mL                PCT < 0.10 ng/mL          Strongly encourage             Strongly discourage   discontinuation of antibiotics    initiation of antibiotics    ----------------------------     -----------------------------       PCT 0.25 - 0.50 ng/mL            PCT 0.10 - 0.25 ng/mL               OR       >80% decrease in PCT            Discourage initiation of                                            antibiotics      Encourage discontinuation           of antibiotics    ----------------------------     -----------------------------         PCT >= 0.50 ng/mL              PCT 0.26 - 0.50 ng/mL               AND        <80% decrease in PCT             Encourage initiation of                                             antibiotics       Encourage continuation           of antibiotics    ----------------------------     -----------------------------        PCT >= 0.50 ng/mL                   PCT > 0.50 ng/mL               AND  increase in PCT                  Strongly encourage                                      initiation of antibiotics    Strongly encourage escalation           of antibiotics                                     -----------------------------                                           PCT <= 0.25 ng/mL                                                 OR                                        > 80% decrease in PCT                                      Discontinue / Do not initiate                                             antibiotics  Performed at [facility], [address], [place], [place] [address]   Sedimentation rate     Status: Abnormal   Collection Time: 03/15/20  7:21 PM  Result Value Ref Range   Sed Rate 48 (H) 0 - 16 mm/hr    Comment: Performed at [facility] [address] [place], [place] [address]  Glucose, capillary     Status: Abnormal   Collection Time: 03/15/20 10:05 PM  Result Value Ref Range   Glucose-Capillary 52 (L) 70 - 99 mg/dL    Comment: Glucose reference range applies only to samples taken after fasting for at least 8 hours.  Glucose, capillary     Status: Abnormal   Collection Time: 03/15/20 10:42 PM  Result Value Ref Range   Glucose-Capillary 126 (H) 70 - 99 mg/dL    Comment: Glucose reference range applies only to samples taken after fasting for at least 8 hours.  I-STAT 7, (LYTES, BLD GAS, ICA, H+H)     Status: Abnormal   Collection Time: 03/15/20 11:05 PM  Result Value Ref Range   pH, Arterial 7.405 7.35 - 7.45   pCO2 arterial 40.4 32 - 48 mmHg   pO2, Arterial 113 (H) 83 - 108 mmHg   Bicarbonate 25.5 20.0 - 28.0 mmol/L   TCO2 27 22 - 32 mmol/L   O2 Saturation 99.0 %   Acid-Base Excess 1.0 0.0 - 2.0 mmol/L   Sodium 140 135 - 145 mmol/L   Potassium  4.5 3.5 - 5.1 mmol/L   Calcium, Ion 1.23 1.15 - 1.40 mmol/L   HCT 38.0 (L) 39 - 52 %   Hemoglobin 12.9 (L) 13.0 - 17.0 g/dL   Patient  temperature 97.6 F    Collection site Radial    Drawn by Operator    Sample type ARTERIAL   Glucose, capillary     Status: None   Collection Time: 03/15/20 11:35 PM  Result Value Ref Range   Glucose-Capillary 90 70 - 99 mg/dL    Comment: Glucose reference range applies only to samples taken after fasting for at least 8 hours.  CBC with Differential/Platelet     Status: None   Collection Time: 03/16/20  2:15 AM  Result Value Ref Range   WBC 9.5 4.0 - 10.5 K/uL   RBC 4.61 4.22 - 5.81 MIL/uL   Hemoglobin 13.5 13.0 - 17.0 g/dL   HCT 41.5 39 - 52 %   MCV 90.0 80.0 - 100.0 fL   MCH 29.3 26.0 - 34.0 pg   MCHC 32.5 30.0 - 36.0 g/dL   RDW 13.6 11.5 - 15.5 %   Platelets 210 150 - 400 K/uL   nRBC 0.0 0.0 - 0.2 %   Neutrophils Relative % 74 %   Neutro Abs 7.0 1.7 - 7.7 K/uL   Lymphocytes Relative 20 %   Lymphs Abs 1.9 0.7 - 4.0 K/uL   Monocytes Relative 5 %   Monocytes Absolute 0.5 0 - 1 K/uL   Eosinophils Relative 0 %   Eosinophils Absolute 0.0 0 - 0 K/uL   Basophils Relative 0 %   Basophils Absolute 0.0 0 - 0 K/uL   Immature Granulocytes 1 %   Abs Immature Granulocytes 0.05 0.00 - 0.07 K/uL    Comment: Performed at [facility], [address] [place], [place] [address]  Procalcitonin     Status: None   Collection Time: 03/16/20  2:15 AM  Result Value Ref Range   Procalcitonin 0.16 ng/mL    Comment:        Interpretation: PCT (Procalcitonin) <= 0.5 ng/mL: Systemic infection (sepsis) is not likely. Local bacterial infection is possible. (NOTE)       Sepsis PCT Algorithm           Lower Respiratory Tract                                      Infection PCT Algorithm    ----------------------------     ----------------------------         PCT < 0.25 ng/mL                PCT < 0.10 ng/mL          Strongly encourage             Strongly discourage   discontinuation of antibiotics    initiation of antibiotics    ----------------------------     -----------------------------        PCT 0.25 - 0.50 ng/mL            PCT 0.10 - 0.25 ng/mL               OR       >80% decrease in PCT            Discourage initiation of  antibiotics      Encourage discontinuation           of antibiotics    ----------------------------     -----------------------------         PCT >= 0.50 ng/mL              PCT 0.26 - 0.50 ng/mL               AND        <80% decrease in PCT             Encourage initiation of                                             antibiotics       Encourage continuation           of antibiotics    ----------------------------     -----------------------------        PCT >= 0.50 ng/mL                  PCT > 0.50 ng/mL               AND         increase in PCT                  Strongly encourage                                      initiation of antibiotics    Strongly encourage escalation           of antibiotics                                     -----------------------------                                           PCT <= 0.25 ng/mL                                                 OR                                        > 80% decrease in PCT                                      Discontinue / Do not initiate                                             antibiotics  Performed at [facility], [place] [address] [place], [place] [address]   Magnesium     Status: None   Collection Time: 03/16/20  2:15 AM  Result Value Ref Range   Magnesium 2.2 1.7 - 2.4 mg/dL    Comment: Performed at [facility], [place] [address], [place], [place] [address]  Phosphorus     Status: None   Collection Time: 03/16/20  2:15 AM  Result Value Ref Range   Phosphorus 4.0 2.5 - 4.6 mg/dL    Comment: Performed at [facility] [address], [place], [place] [address]  Basic metabolic panel     Status: Abnormal   Collection Time: 03/16/20  2:15 AM  Result Value Ref Range   Sodium 139 135 - 145 mmol/L   Potassium 4.3  3.5 - 5.1 mmol/L   Chloride 106 98 - 111 mmol/L   CO2 23 22 - 32 mmol/L   Glucose, Bld 66 (L) 70 - 99 mg/dL    Comment: Glucose reference range applies only to samples taken after fasting for at least 8 hours.   BUN 33 (H) 8 - 23 mg/dL   Creatinine, Ser 0.94 0.61 - 1.24 mg/dL   Calcium 8.7 (L) 8.9 - 10.3 mg/dL   GFR calc non Af Amer >60 >60 mL/min   GFR calc Af Amer >60 >60 mL/min   Anion gap 10 5 - 15    Comment: Performed at [facility] [address] [place], [place] [address]  Glucose, capillary     Status: Abnormal   Collection Time: 03/16/20  3:24 AM  Result Value Ref Range   Glucose-Capillary 50 (L) 70 - 99 mg/dL    Comment: Glucose reference range applies only to samples taken after fasting for at least 8 hours.  Glucose, capillary     Status: Abnormal   Collection Time: 03/16/20  4:13 AM  Result Value Ref Range   Glucose-Capillary 100 (H) 70 - 99 mg/dL    Comment: Glucose reference range applies only to samples taken after fasting for at least 8 hours.  Glucose, capillary     Status: Abnormal   Collection Time: 03/16/20  8:06 AM  Result Value Ref Range   Glucose-Capillary 68 (L) 70 - 99 mg/dL    Comment: Glucose reference range applies only to samples taken after fasting for at least 8 hours.  Glucose, capillary     Status: Abnormal   Collection Time: 03/16/20 11:12 AM  Result Value Ref Range   Glucose-Capillary 111 (H) 70 - 99 mg/dL    Comment: Glucose reference range applies only to samples taken after fasting for at least 8 hours.  MRSA PCR Screening     Status: None   Collection Time: 03/16/20 11:49 AM   Specimen: Nasal Mucosa; Nasopharyngeal  Result Value Ref Range   MRSA by PCR NEGATIVE NEGATIVE    Comment:        The GeneXpert MRSA Assay (FDA approved for NASAL specimens only), is one component of a comprehensive MRSA colonization surveillance program. It is not intended to diagnose MRSA infection nor to guide or monitor treatment for MRSA  infections. Performed at [facility], [place] [address] [place], [place] [address]     Recent Results (from the past 240 hour(s))  SARS Coronavirus 2 by RT PCR (hospital order, performed in [facility] hospital lab) Nasopharyngeal Nasopharyngeal Swab     Status: None   Collection Time: 03/08/20 11:33 AM   Specimen: Nasopharyngeal Swab  Result Value Ref Range Status   SARS Coronavirus 2 NEGATIVE NEGATIVE Final    Comment: (NOTE) SARS-CoV-2 target nucleic acids  are NOT DETECTED.  The SARS-CoV-2 RNA is generally detectable in upper and lower respiratory specimens during the acute phase of infection. The lowest concentration of SARS-CoV-2 viral copies this assay can detect is 250 copies / mL. A negative result does not preclude SARS-CoV-2 infection and should not be used as the sole basis for treatment or other patient management decisions.  A negative result may occur with improper specimen collection / handling, submission of specimen other than nasopharyngeal swab, presence of viral mutation(s) within the areas targeted by this assay, and inadequate number of viral copies (<250 copies / mL). A negative result must be combined with clinical observations, patient history, and epidemiological information.  Fact Sheet for Patients:   StrictlyIdeas.no  Fact Sheet for Healthcare Providers: BankingDealers.co.za  This test is not yet approved or  cleared by the [place] FDA and has been authorized for detection and/or diagnosis of SARS-CoV-2 by FDA under an Emergency Use Authorization (EUA).  This EUA will remain in effect (meaning this test can be used) for the duration of the COVID-19 declaration under Section 564(b)(1) of the Act, 21 U.S.C. section 360bbb-3(b)(1), unless the authorization is terminated or revoked sooner.  Performed at [facility], [place] [address] [place], [place] [address]   Gram stain     Status:  None   Collection Time: 03/10/20  5:15 PM   Specimen: PATH Cytology CSF; Cerebrospinal Fluid  Result Value Ref Range Status   Specimen Description CSF  Final   Special Requests NONE  Final   Gram Stain   Final    WBC PRESENT, PREDOMINANTLY PMN NO ORGANISMS SEEN CYTOSPIN SMEAR Performed at [facility], [place] [address] [place], [place] [address]    Report Status 03/10/2020 FINAL  Final  MRSA PCR Screening     Status: None   Collection Time: 03/16/20 11:49 AM   Specimen: Nasal Mucosa; Nasopharyngeal  Result Value Ref Range Status   MRSA by PCR NEGATIVE NEGATIVE Final    Comment:        The GeneXpert MRSA Assay (FDA approved for NASAL specimens only), is one component of a comprehensive MRSA colonization surveillance program. It is not intended to diagnose MRSA infection nor to guide or monitor treatment for MRSA infections. Performed at [facility], [place] [address], [place], [place] [address]     Lipid Panel No results for input(s): CHOL, TRIG, HDL, CHOLHDL, VLDL, LDLCALC in the last 72 hours.  Studies/Results: DG Chest 1 View  Result Date: 03/16/2020 CLINICAL DATA:  Respiratory failure EXAM: CHEST  1 VIEW COMPARISON:  There yesterday FINDINGS: Patchy interstitial and airspace opacity with mild improvement in aeration. As before, this could be infection or atypical edema. No effusion or pneumothorax. Stable cardiomediastinal contours which are distorted by rightward rotation. IMPRESSION: Mildly improved aeration. Electronically Signed   By: Monte Fantasia M.D.   On: 03/16/2020 05:30   MR CERVICAL SPINE W WO CONTRAST  Result Date: 03/16/2020 CLINICAL DATA:  Demyelinating disease EXAM: MRI CERVICAL AND THORACIC SPINE WITHOUT AND WITH CONTRAST TECHNIQUE: Multisequence MR imaging of the CERVICAL AND THORACIC SPINE was performed prior to and following IV contrast administration. CONTRAST:  48mL GADAVIST GADOBUTROL 1 MMOL/ML IV SOLN COMPARISON:  03/09/2020 FINDINGS: MRI  CERVICAL SPINE FINDINGS Alignment: Physiologic Vertebrae: No fracture, evidence of discitis, or bone lesion. No evidence of altered marrow enhancement. Cord: Progressive T2 hyperintensity within the central cord extending from C4-5 to T4-5, with mild cord expansion and subtle patchy enhancement  at the cervical levels. No cord compression or canal collection. No abnormal vessels over the cord. Posterior Fossa, vertebral arteries, paraspinal tissues: No contributory finding Disc levels: Ordinary degenerative changes.  No cord compression. MRI THORACIC SPINE FINDINGS Alignment:  Mild exaggerated thoracic kyphosis. Vertebrae: No fracture, evidence of discitis, or bone lesion. No abnormal vertebral enhancement. Cord:  T2 hyperintensity in the central cord as noted above. Paraspinal and other soft tissues: Small pleural effusions. Mild dependent atelectasis. Disc levels: Spondylosis which is likely bridging at multiple levels. IMPRESSION: Progressive cord edema extending from C4-5 to T4-5 with mild patchy enhancement. Transverse myelitis and cord infarct are the primary considerations, with progression favoring myelitis. Electronically Signed   By: Monte Fantasia M.D.   On: 03/16/2020 10:45   MR THORACIC SPINE W WO CONTRAST  Result Date: 03/16/2020 CLINICAL DATA:  Demyelinating disease EXAM: MRI CERVICAL AND THORACIC SPINE WITHOUT AND WITH CONTRAST TECHNIQUE: Multisequence MR imaging of the CERVICAL AND THORACIC SPINE was performed prior to and following IV contrast administration. CONTRAST:  70mL GADAVIST GADOBUTROL 1 MMOL/ML IV SOLN COMPARISON:  03/09/2020 FINDINGS: MRI CERVICAL SPINE FINDINGS Alignment: Physiologic Vertebrae: No fracture, evidence of discitis, or bone lesion. No evidence of altered marrow enhancement. Cord: Progressive T2 hyperintensity within the central cord extending from C4-5 to T4-5, with mild cord expansion and subtle patchy enhancement at the cervical levels. No cord compression or canal  collection. No abnormal vessels over the cord. Posterior Fossa, vertebral arteries, paraspinal tissues: No contributory finding Disc levels: Ordinary degenerative changes.  No cord compression. MRI THORACIC SPINE FINDINGS Alignment:  Mild exaggerated thoracic kyphosis. Vertebrae: No fracture, evidence of discitis, or bone lesion. No abnormal vertebral enhancement. Cord:  T2 hyperintensity in the central cord as noted above. Paraspinal and other soft tissues: Small pleural effusions. Mild dependent atelectasis. Disc levels: Spondylosis which is likely bridging at multiple levels. IMPRESSION: Progressive cord edema extending from C4-5 to T4-5 with mild patchy enhancement. Transverse myelitis and cord infarct are the primary considerations, with progression favoring myelitis. Electronically Signed   By: Monte Fantasia M.D.   On: 03/16/2020 10:45   DG CHEST PORT 1 VIEW  Result Date: 03/15/2020 CLINICAL DATA:  Shortness of breath EXAM: PORTABLE CHEST 1 VIEW COMPARISON:  03/08/2020 FINDINGS: Airspace disease throughout the lungs bilaterally, right greater than left. Mild cardiomegaly. No visible effusions. No acute bony abnormality. IMPRESSION: Patchy bilateral airspace disease, right greater than left with cardiomegaly. Findings could reflect asymmetric edema or infection. Electronically Signed   By: Rolm Baptise M.D.   On: 03/15/2020 18:43   DG Abd Portable 1V  Result Date: 03/15/2020 CLINICAL DATA:  Shortness of breath EXAM: PORTABLE ABDOMEN - 1 VIEW COMPARISON:  CT 03/08/2020 FINDINGS: Nonobstructive bowel gas pattern. Prior cholecystectomy. No organomegaly or free air. Hyperdense area seen in the left upper quadrant corresponding to the layering high-density material/calcifications within the left renal cyst. Diffuse airspace disease throughout the lungs. IMPRESSION: No evidence of bowel obstruction or free air. Layering calcifications within the left renal cyst as seen on prior CT. Diffuse airspace disease  throughout the lungs. Electronically Signed   By: Rolm Baptise M.D.   On: 03/15/2020 18:46    Medications:  Scheduled: . aspirin EC  81 mg Oral QHS  . bisacodyl  10 mg Rectal Once  . chlorhexidine  15 mL Mouth Rinse BID  . Chlorhexidine Gluconate Cloth  6 each Topical Q0600  . docusate sodium  100 mg Oral BID  . enoxaparin (LOVENOX) injection  50 mg  Subcutaneous Q24H  . fenofibrate  160 mg Oral QHS  . fluticasone  2 spray Each Nare QHS  . icosapent Ethyl  2 g Oral BID  . insulin aspart  0-9 Units Subcutaneous Q4H  . lidocaine  1 patch Transdermal Q24H  . mouth rinse  15 mL Mouth Rinse q12n4p  . metoCLOPramide (REGLAN) injection  10 mg Intravenous Q6H  . multivitamin with minerals  1 tablet Oral Daily  . nystatin  1 application Topical TID  . oxybutynin  5 mg Oral BID  . pantoprazole  40 mg Oral Daily  . polyethylene glycol  17 g Oral Daily  . rosuvastatin  40 mg Oral QHS  . sertraline  150 mg Oral QHS  . vitamin B-12  500 mcg Oral Daily  . zolpidem  5 mg Oral QHS   Continuous: . sodium chloride 10 mL/hr at 03/16/20 1400      Assessment: 74 year old male with past medical history significant for coronary artery disease, diabetes mellitus, hypertension, hyperlipidemia, obstructive sleep apnea and obesity who presented to the ED on 6/20 with initial chief complaint of acute chest pain, diaphoresis and sharp back pain. MI was ruled out and CTA was negative for dissection. Later on the same day, the patient experienced abrupt onset of RLE flaccid weakness with preservation of sensation. The weakness spread to the LLE on 6/21 and distal bilateral upper extremity weakness was noted shortly afterwards. He had a sensory level at T6-T9. 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 The patient was treated with 5 days of IV methylprednisolone without improvement.  1. Contrast-enhanced cervical and thoracic spine MRI performed this AM (6/28) revealed progressive cord edema extending from C4-5 to T4-5 with mild patchy enhancement. Transverse myelitis and cord infarct are the primary considerations, with progression favoring myelitis, per Radiology. However, the abrupt onset on 6/20 is more suggestive of a spinal cord infarction. 2. Underwent LP under fluoroscopy on 6/22 with the following results: CSF cell count 1, Protein 53, IgG index: normal 3. Other labs included the following: B12 714, Ceruloplasmin normal, Folate normal, A1c 6.7, SSA and SSB: Negative, ACE: Negative, ANA negative, vitamin B1 normal 4. Pending labs: NMO, RF, serum paraneoplastic panel, serumMOG antibody 5. MRI brain was negative for acute infarction or demyelination.  6. Transferred to the ICU on Sunday due to increased work of breathing and abdominal distention which has been ongoing likely due to no bowel movementsx x 5 days. His respiratory decompensation may be related to the rostral extension of the cord edema to the C4-5 level, as seen on today's MRI (6/28). .   Recommendations: - Discussed recommendation to start IVIG with the patient and his wife. Risks/benefits discussed at length. Although I feel that his presentation is overall most consistent with a spinal cord infarction, the probability that this may be an atypical appearing transverse myelitis is high enough to warrant inpatient immunomodulatory treatment. Patient and wife would like to discuss this in private prior to making a decision.  -- PLEX is not a good option due to the risk for hypotension, which could worsen his condition if his cord lesion is due to a spinal cord infarction.  -Aggressive PT/OT -Continue aspirin 81 mg daily  -Consider starting atorvastatin20 mg daily -Protonix 40 mg  daily -NIFevery 12 hours -Frequent  neurochecks -Echocardiogram:No PFO, EF 60 to 65%. Outpatient loop monitor.  50 minutes spent in the neurological evaluation and management of this critically ill patient. Greater than 50% of this time was spent discussing the clinical presentation, lab results and treatment options with the patient and his wife.   Addendum: The patient has decided, after discussion with his wife, to proceed with IVIG.   LOS: 7 days   @Electronically  signed: Dr. Kerney Elbe 03/16/2020  2:49 PM

## 2020-03-16 NOTE — Progress Notes (Signed)
[facility] Progress Note Patient Name: Brent [name]. DOB: Jan 16, 1946 MRN: [id number]   Date of Service  03/16/2020  HPI/Events of Note  Hypoglycemia - Blood glucose = 50.   eICU Interventions  Plan: 1. D10W IV infusion to run at 30 mL/hour.      Intervention Category Major Interventions: Other:  Lysle Dingwall 03/16/2020, 4:38 AM

## 2020-03-16 NOTE — Progress Notes (Signed)
Inpatient Diabetes Program Recommendations  AACE/ADA: New Consensus Statement on Inpatient Glycemic Control  Target Ranges:  Prepandial:   less than 140 mg/dL      Peak postprandial:   less than 180 mg/dL (1-2 hours)      Critically ill patients:  140 - 180 mg/dL   Results for [name], Brent [name]. "Brent [name] (MRN [id number]) as of 03/16/2020 10:56  Ref. Range 03/15/2020 06:27 03/15/2020 12:53 03/15/2020 17:17 03/15/2020 22:05 03/15/2020 22:42 03/15/2020 23:35 03/16/2020 03:24 03/16/2020 04:13 03/16/2020 08:06  Glucose-Capillary Latest Ref Range: 70 - 99 mg/dL 91 74 72 52 (L) 126 (H) 90 50 (L) 100 (H) 68 (L)   Review of Glycemic Control  Diabetes history: DM2 Outpatient Diabetes medications: Lantus 60 units daily, Novolog 25-30 units TID with meals,Jardiance 12.5 mg daily, Metformin 1000 mg BID Current orders for Inpatient glycemic control: Lantus 60 units QHS, Novolog 0-9 units Q4H  Inpatient Diabetes Program Recommendations:   Insulin - Basal: Lantus was not given on 03/15/20. Noted steroids have been discontinued and diet resumed today.  Please consider decreasing Lantus to 30 units QHS.  Thanks, Barnie Alderman, RN, MSN, CDE Diabetes Coordinator Inpatient Diabetes Program [phone] (Team Pager from 8am to 5pm)

## 2020-03-16 NOTE — Progress Notes (Signed)
NIF= -20 VC= 1.1L Good effort from pt.

## 2020-03-16 NOTE — Progress Notes (Signed)
NIF and VC are ordered QD, was done during day shift.

## 2020-03-16 NOTE — Progress Notes (Signed)
Physical Therapy Treatment Patient Details Name: Brent [name]. MRN: [id number] DOB: 1945/11/13 Today's Date: 03/16/2020    History of Present Illness Pt adm 6/20 with chest pain and developed RLE weakness in ED. Pt for nuclear stress test on 6/21 where his weakness progressed to BLE's and Bil hands. Neuro consulted and work up in progress. PMH - CAD, DM, peripheral neuropathy, HTN, MI, obesity    PT Comments    Fielded questions from wife and pt about different potential diagnoses.  Emphasis on rolling for pericare, transtitions to EOB, sitting balance with UE's in midline and propped back supported on bil UE's.  Ended upright in full sitting in bed.    Follow Up Recommendations  CIR     Equipment Recommendations  Other (comment);Wheelchair (measurements PT);Wheelchair cushion (measurements PT) (TBD further)    Recommendations for Other Services       Precautions / Restrictions Precautions Precautions: Fall    Mobility  Bed Mobility Overal bed mobility: Needs Assistance Bed Mobility: Rolling;Sidelying to Sit;Sit to Supine Rolling: Mod assist Sidelying to sit: Max assist;+2 for physical assistance   Sit to supine: Max assist;+2 for physical assistance   General bed mobility comments: Pt helps with UE's.  Used the "hook" method for pt to assist minimally.  Significant truncal assist to come up with pt helping minimally with UE's  Transfers                 General transfer comment: pt stayed in the bed today  Ambulation/Gait                 Stairs             Wheelchair Mobility    Modified Rankin (Stroke Patients Only) Modified Rankin (Stroke Patients Only) Pre-Morbid Rankin Score: No symptoms Modified Rankin: Severe disability (If this is a spinal cord infarct.)     Balance Overall balance assessment: Needs assistance Sitting-balance support: Feet supported;Single extremity supported;Bilateral upper extremity supported Sitting  balance-Leahy Scale: Poor Sitting balance - Comments: Worked on UE supported sitting (propped on arms behind) with minimal support in addition.  Pt able to support himself without assist for several secs..  Total time at EOB>10                                     Cognition Arousal/Alertness: Awake/alert Behavior During Therapy: WFL for tasks assessed/performed Overall Cognitive Status: Within Functional Limits for tasks assessed                                 General Comments: pt agreeable and cooperative through session. Wife present and supportive      Exercises Other Exercises Other Exercises: PROM/stretch on bil LE's, aa/aROM of bil UE's    General Comments General comments (skin integrity, edema, etc.): vss with pt sitting up.  Pt ended up in bed in a chair position with minimal UE propping.      Pertinent Vitals/Pain Pain Assessment: Faces Faces Pain Scale: Hurts a little bit Pain Location: head and neck Pain Descriptors / Indicators: Aching Pain Intervention(s): Monitored during session    Home Living                      Prior Function            PT Goals (current  goals can now be found in the care plan section) Acute Rehab PT Goals Patient Stated Goal: return to independence PT Goal Formulation: With patient Time For Goal Achievement: 03/24/20 Potential to Achieve Goals: Good Progress towards PT goals: Progressing toward goals    Frequency    Min 3X/week      PT Plan Current plan remains appropriate    Co-evaluation              AM-PAC PT "6 Clicks" Mobility   Outcome Measure  Help needed turning from your back to your side while in a flat bed without using bedrails?: A Lot Help needed moving from lying on your back to sitting on the side of a flat bed without using bedrails?: Total Help needed moving to and from a bed to a chair (including a wheelchair)?: Total Help needed standing up from a chair using  your arms (e.g., wheelchair or bedside chair)?: Total Help needed to walk in hospital room?: Total Help needed climbing 3-5 steps with a railing? : Total 6 Click Score: 7    End of Session   Activity Tolerance: Patient tolerated treatment well Patient left: in bed;with call bell/phone within reach;with chair alarm set;Other (comment) (in full chair position.) Nurse Communication: Mobility status;Need for lift equipment;Other (comment) PT Visit Diagnosis: Other abnormalities of gait and mobility (R26.89);Other symptoms and signs involving the nervous system (R29.898)     Time: 1615-1700 PT Time Calculation (min) (ACUTE ONLY): 45 min  Charges:  $Therapeutic Activity: 23-37 mins $Neuromuscular Re-education: 8-22 mins                     03/16/2020  Brent Carne., PT Acute Rehabilitation Services [phone]  (pager) [phone]  (office)   Brent [name] 03/16/2020, 5:34 PM

## 2020-03-16 NOTE — Progress Notes (Signed)
NAME:  Brent [name]., MRN:  [id number], DOB:  Dec 25, 1945, LOS: 7 ADMISSION DATE:  03/08/2020, CONSULTATION DATE:  03/15/20 REFERRING MD:  Doristine Bosworth, CHIEF COMPLAINT:  SOB   Brief History   74 year old man w/ hx of metabolic syndrome presenting with chest pain followed by increasing weakness and SOB.  Worsened respiratory status prompted CCM consult.  History of present illness   74 year old man with PMH listed below including melanoma presenting with chest pain with neg stress test.  He then developed scattered neurological deficits prompting extensive workup to date (LP, MRI of entire spine, brain).  Differential is presently spinal cord infarct, transverse myelitis, paraneoplastic disease.  Thought to be stable in terms of neuro exam until this evening when he became more SOB.  Sounds like he has not been having many BMs.     Past Medical History  IDDM CAD HTN HLD OSA Prior melanoma of L neck  Significant Hospital Events   6/21 admitted 6/22 LP  Consults:  Neuro, Cardiology  Procedures:  N/A  Significant Diagnostic Tests:  Echo grade 1 diastolic dysfunction MRI Brain neg C/T/L spine w/ wo contrast: 6x58mm enhancing lesion in the T9  Micro Data:  COVID 6/20 neg CSF neg  Antimicrobials:  N/A  6/22-6/26 IV steroids 1g daily  Interim history/subjective:   Just went down for MRI brain, results pending.  Received low-dose Ativan to tolerate Hypoglycemic overnight while n.p.o., D10 started  Objective   Blood pressure (!) 130/57, pulse 62, temperature 98.3 F (36.8 C), resp. rate (!) 21, height 6' (1.829 m), weight 110.3 kg, SpO2 91 %.        Intake/Output Summary (Last 24 hours) at 03/16/2020 1023 Last data filed at 03/16/2020 0900 Gross per 24 hour  Intake 823.38 ml  Output 2755 ml  Net -1931.62 ml   Filed Weights   03/08/20 2044 03/12/20 0619 03/15/20 1905  Weight: 98.4 kg 102.7 kg 110.3 kg    Examination: GEN: No distress, very slight  tachypnea HEENT: Oropharynx dry, no lesions, pupils equal CV: Regular, distant, no murmur PULM: Good effort, decreased at both bases with some inspiratory crackles GI: Protuberant, tympanic, positive bowel sounds EXT: Trace bilateral lower extremity edema NEURO: Unable to move his bilateral lower extremities against gravity, no foot flexion or extension.  Weak grip but can move his arms with good strength PSYCH: RASS 0 SKIN: No rash  Chest x-ray today 6/28, slightly improved bilateral lower extremity atelectasis, interstitial pattern  Resolved Hospital Problem list   N/A  Assessment & Plan:  Neuromuscular weakness of unclear origin Acute hypoxemic respiratory failure related to weakness, question secretion clearance issues and volume overload as well as constipation reducing FRC.  Procalcitonin -6/27 Continue to follow closely BiPAP nightly mandatory, as needed during the day Repeat empiric Lasix 6/28 Will continue to discuss with neurology, question any other interventions to make for his weakness Pulmonary hygiene  DM now with hypoglycemia (off steroids) Continue D10 until able to restart enteral nutrition 6/28 Following CBG  Constipation-  Bowel regimen  Groin candidal infection- Nystatin  Best practice:  Diet: diabetic diet 6/28 Pain/Anxiety/Delirium protocol (if indicated): N/A VAP protocol (if indicated): N/A DVT prophylaxis: Lovenox GI prophylaxis: N/A Glucose control: See above Mobility: BR Code Status: Full Family Communication: no family at bedside 6/28 Disposition: ICU  Labs   CBC: Recent Labs  Lab 03/12/20 0339 03/12/20 0339 03/13/20 0403 03/14/20 0406 03/15/20 0138 03/15/20 2305 03/16/20 0215  WBC 10.9*  --  10.9* 11.3* 10.4  --  9.5  NEUTROABS 7.9*  --  9.2* 7.9* 7.5  --  7.0  HGB 12.9*   < > 12.5* 12.6* 13.6 12.9* 13.5  HCT 39.2   < > 38.2* 39.6 42.8 38.0* 41.5  MCV 88.9  --  89.9 90.8 91.5  --  90.0  PLT 183  --  199 188 218  --  210   <  > = values in this interval not displayed.    Basic Metabolic Panel: Recent Labs  Lab 03/12/20 0339 03/12/20 0339 03/13/20 0403 03/14/20 0406 03/15/20 0138 03/15/20 2305 03/16/20 0215  NA 133*   < > 138 139 137 140 139  K 4.1   < > 4.6 4.8 4.8 4.5 4.3  CL 103  --  108 110 107  --  106  CO2 17*  --  20* 20* 21*  --  23  GLUCOSE 235*  --  114* 98 172*  --  66*  BUN 77*  --  63* 46* 36*  --  33*  CREATININE 1.64*  --  1.32* 1.06 0.99  --  0.94  CALCIUM 8.6*  --  8.5* 8.4* 8.5*  --  8.7*  MG  --   --   --   --   --   --  2.2  PHOS  --   --   --   --   --   --  4.0   < > = values in this interval not displayed.   GFR: Estimated Creatinine Clearance: 87.1 mL/min (by C-G formula based on SCr of 0.94 mg/dL). Recent Labs  Lab 03/13/20 0403 03/14/20 0406 03/15/20 0138 03/15/20 1921 03/16/20 0215  PROCALCITON  --   --   --  <0.10 0.16  WBC 10.9* 11.3* 10.4  --  9.5  LATICACIDVEN  --   --   --  0.9  --     Liver Function Tests: Recent Labs  Lab 03/10/20 0319 03/10/20 1757 03/11/20 0222  AST 29  --  32  ALT 22  --  24  ALKPHOS 44  --  45  BILITOT 0.6  --  0.6  PROT 6.2*  --  6.3*  ALBUMIN 3.7 4.2 3.6   No results for input(s): LIPASE, AMYLASE in the last 168 hours. No results for input(s): AMMONIA in the last 168 hours.  ABG    Component Value Date/Time   PHART 7.405 03/15/2020 2305   PCO2ART 40.4 03/15/2020 2305   PO2ART 113 (H) 03/15/2020 2305   HCO3 25.5 03/15/2020 2305   TCO2 27 03/15/2020 2305   O2SAT 99.0 03/15/2020 2305     Coagulation Profile: No results for input(s): INR, PROTIME in the last 168 hours.  Cardiac Enzymes: Recent Labs  Lab 03/15/20 1921  CKTOTAL 119    HbA1C: Hemoglobin A1C  Date/Time Value Ref Range Status  08/23/2019 09:30 AM 6.4 (A) 4.0 - 5.6 % Final  05/24/2019 09:03 AM 6.4 (A) 4.0 - 5.6 % Final   Hgb A1c MFr Bld  Date/Time Value Ref Range Status  03/09/2020 04:04 AM 6.7 (H) 4.8 - 5.6 % Final    Comment:    (NOTE)          Prediabetes: 5.7 - 6.4         Diabetes: >6.4         Glycemic control for adults with diabetes: <7.0   04/15/2016 11:33 AM 7.4 (H) 4.6 - 6.5 % Final  Comment:    Glycemic Control Guidelines for People with Diabetes:Non Diabetic:  <6%Goal of Therapy: <7%Additional Action Suggested:  >8%     CBG: Recent Labs  Lab 03/15/20 2242 03/15/20 2335 03/16/20 0324 03/16/20 0413 03/16/20 0806  GLUCAP 126* 90 50* 100* 68*     Critical care time: 31 minutes     Baltazar Apo, MD, PhD 03/16/2020, 10:23 AM [facility] [phone] or if no answer [phone]

## 2020-03-16 NOTE — Progress Notes (Signed)
Patient transported to MRI via bed with oxygen and RN .

## 2020-03-17 ENCOUNTER — Inpatient Hospital Stay (HOSPITAL_COMMUNITY): Payer: Medicare Other

## 2020-03-17 LAB — BASIC METABOLIC PANEL
Anion gap: 8 (ref 5–15)
BUN: 33 mg/dL — ABNORMAL HIGH (ref 8–23)
CO2: 24 mmol/L (ref 22–32)
Calcium: 8.3 mg/dL — ABNORMAL LOW (ref 8.9–10.3)
Chloride: 104 mmol/L (ref 98–111)
Creatinine, Ser: 0.97 mg/dL (ref 0.61–1.24)
GFR calc Af Amer: >60 mL/min (ref >60)
GFR calc non Af Amer: >60 mL/min (ref >60)
Glucose, Bld: 98 mg/dL (ref 70–99)
Potassium: 4 mmol/L (ref 3.5–5.1)
Sodium: 136 mmol/L (ref 135–145)

## 2020-03-17 LAB — GLUCOSE, CAPILLARY
Glucose-Capillary: 92 mg/dL (ref 70–99)
Glucose-Capillary: 90 mg/dL (ref 70–99)
Glucose-Capillary: 111 mg/dL — ABNORMAL HIGH (ref 70–99)
Glucose-Capillary: 198 mg/dL — ABNORMAL HIGH (ref 70–99)
Glucose-Capillary: 250 mg/dL — ABNORMAL HIGH (ref 70–99)

## 2020-03-17 LAB — CBC WITH DIFFERENTIAL/PLATELET
Abs Immature Granulocytes: 0.05 10*3/uL (ref 0.00–0.07)
Basophils Absolute: 0 10*3/uL (ref 0.0–0.1)
Basophils Relative: 0 %
Eosinophils Absolute: 0.1 10*3/uL (ref 0.0–0.5)
Eosinophils Relative: 1 %
HCT: 37.6 % — ABNORMAL LOW (ref 39.0–52.0)
Hemoglobin: 12.1 g/dL — ABNORMAL LOW (ref 13.0–17.0)
Immature Granulocytes: 1 %
Lymphocytes Relative: 20 %
Lymphs Abs: 1.5 10*3/uL (ref 0.7–4.0)
MCH: 29.1 pg (ref 26.0–34.0)
MCHC: 32.2 g/dL (ref 30.0–36.0)
MCV: 90.4 fL (ref 80.0–100.0)
Monocytes Absolute: 0.4 10*3/uL (ref 0.1–1.0)
Monocytes Relative: 5 %
Neutro Abs: 5.5 10*3/uL (ref 1.7–7.7)
Neutrophils Relative %: 73 %
Platelets: 203 10*3/uL (ref 150–400)
RBC: 4.16 MIL/uL — ABNORMAL LOW (ref 4.22–5.81)
RDW: 13.6 % (ref 11.5–15.5)
WBC: 7.5 10*3/uL (ref 4.0–10.5)
nRBC: 0 % (ref 0.0–0.2)

## 2020-03-17 LAB — ANTIPHOSPHOLIPID SYNDROME EVAL, BLD
Anticardiolipin IgA: <9 APL U/mL (ref 0–11)
Anticardiolipin IgG: <9 GPL U/mL (ref 0–14)
Anticardiolipin IgM: 9 MPL U/mL (ref 0–12)
DRVVT: 46.5 s (ref 0.0–47.0)
PTT Lupus Anticoagulant: 32.7 s (ref 0.0–51.9)
Phosphatydalserine, IgA: 1 APS IgA (ref 0–20)
Phosphatydalserine, IgG: 3 GPS IgG (ref 0–11)
Phosphatydalserine, IgM: 14 MPS IgM (ref 0–25)

## 2020-03-17 LAB — PROCALCITONIN: Procalcitonin: 0.33 ng/mL

## 2020-03-17 MED ORDER — INSULIN ASPART 100 UNIT/ML ~~LOC~~ SOLN
0.0000 [IU] | Freq: Three times a day (TID) | SUBCUTANEOUS | Status: DC
  Administered 2020-03-17 – 2020-03-18 (×3): 4 [IU] via SUBCUTANEOUS
  Administered 2020-03-18: 7 [IU] via SUBCUTANEOUS
  Administered 2020-03-19: 4 [IU] via SUBCUTANEOUS
  Administered 2020-03-19: 7 [IU] via SUBCUTANEOUS
  Administered 2020-03-19: 3 [IU] via SUBCUTANEOUS
  Administered 2020-03-20: 4 [IU] via SUBCUTANEOUS
  Administered 2020-03-20: 7 [IU] via SUBCUTANEOUS
  Administered 2020-03-20: 3 [IU] via SUBCUTANEOUS
  Administered 2020-03-21 – 2020-03-22 (×4): 4 [IU] via SUBCUTANEOUS
  Administered 2020-03-22: 3 [IU] via SUBCUTANEOUS
  Administered 2020-03-22 – 2020-03-23 (×2): 4 [IU] via SUBCUTANEOUS
  Administered 2020-03-23: 7 [IU] via SUBCUTANEOUS
  Administered 2020-03-23 – 2020-03-24 (×3): 4 [IU] via SUBCUTANEOUS
  Administered 2020-03-24: 3 [IU] via SUBCUTANEOUS
  Administered 2020-03-25: 4 [IU] via SUBCUTANEOUS

## 2020-03-17 MED ORDER — INSULIN ASPART 100 UNIT/ML ~~LOC~~ SOLN
0.0000 [IU] | Freq: Every day | SUBCUTANEOUS | Status: DC
  Administered 2020-03-17: 2 [IU] via SUBCUTANEOUS
  Administered 2020-03-18: 3 [IU] via SUBCUTANEOUS
  Administered 2020-03-20 – 2020-03-24 (×3): 2 [IU] via SUBCUTANEOUS

## 2020-03-17 MED ORDER — BISACODYL 10 MG RE SUPP
10.0000 mg | Freq: Once | RECTAL | Status: AC
  Administered 2020-03-17: 10 mg via RECTAL
  Filled 2020-03-17: qty 1

## 2020-03-17 NOTE — Evaluation (Signed)
Clinical/Bedside Swallow Evaluation Patient Details  Name: Brent [name]. MRN: [id number] Date of Birth: 07/11/1946  Today's Date: 03/17/2020 Time: SLP Start Time (ACUTE ONLY): 1125 SLP Stop Time (ACUTE ONLY): 1137 SLP Time Calculation (min) (ACUTE ONLY): 12 min  Past Medical History:  Past Medical History:  Diagnosis Date  . Allergy   . CAD (coronary artery disease)    a. angioplasty of his RCA in 1990. b. bare metal stent placed in the RCA in 2000 followed by rotational atherectomy shortly after for stent restenosis. c. last cath was in 2012 showing stable moderate diffuse CAD. (70% mid LAD, 80% diagonal, 70% Ramus, 40% mid to distal RCA stent restenosis). d. Low risk nuc in 2015.  . Diabetes mellitus   . Diverticulosis   . Elevated CK   . Erectile dysfunction   . Hemorrhoids   . HTN (hypertension)   . Hyperlipidemia   . Hypertriglyceridemia   . Malignant melanoma of left side of neck ([place]) 10/25/2018  . Myocardial infarction ([place])   . Obesity   . OSA (obstructive sleep apnea)   . Persistent disorder of initiating or maintaining sleep   . Personal history of colonic polyps 02/05/2003   Past Surgical History:  Past Surgical History:  Procedure Laterality Date  . CHOLECYSTECTOMY    . CORONARY ANGIOPLASTY    . CORONARY STENT PLACEMENT     stenting of the right coronary artery with followup rotational  atherectomy. (3 stents placed)  . FINGER SURGERY     right  . FOOT SURGERY     right  . INGUINAL HERNIA REPAIR     right  . melanoma removal     neck  . ORTHOPEDIC SURGERY     foot right  . rotator cuff surg     Bil   HPI:  74 year old man with PMH listed below including melanoma presenting with chest pain with neg stress test.  He then developed scattered neurological deficits prompting extensive workup to date.   Pt was evalauted by SLP on 6/23, found ot have weak cough mechanism, but no signs of aspiration, tolerating diet. Thought to be stable in terms of neuro  exam until 6/27 when he became more SOB, reported increased LE weakness. Critical care questioned secretion clearance issues and volume overload as well as constipation reducing FRC.   Chest x-ray shows possibility of infiltrates or atelectasis.  MRI performed (6/28) revealed progressive cord edema extending from C4-5 to T4-5 with mild patchy enhancement. Transverse myelitis and cord infarct are the primary considerations, with progression favoring myelitis, per Radiology. However, the abrupt onset on 6/20 is more suggestive of a spinal cord infarction. Initiated empiric IVIG. On a liquid diet.    Assessment / Plan / Recommendation Clinical Impression  Per pt and RN pt has been coughing/choking on water at the end of his meal. RN has observed that it seems to happen more aften if he is sipping from a cup while also being fed. But they both report it hasnt really happened when he is just taking sips of water from a straw. On oral mech, no CN weakness is observed. He does have dry oral mucosa, articulation is not a crisp as expected, vocal quality is low and slightly breathy. Pts cough is weak and pt uses accessory muscles to achieve a cough. His NIF has stayed stable and adequate at -40 per notes and discussion with RT. Pt reports he is always fatigued, but also more so with activity. He  cannot confirm or deny respiratory or muscle fatigue with meals. Under observation, pt tolerates sips of water from a cup alternating with bite of cookie, without difficulty. Will proceed to [facility] tomorrow at 1 pm. Hopeful that pt will be moderately fatigued from the day and present with deficits if present. He may continue current diet at this time.  SLP Visit Diagnosis: Dysphagia, unspecified (R13.10)    Aspiration Risk  Mild aspiration risk    Diet Recommendation Regular;Thin liquid   Liquid Administration via: Cup;Straw Medication Administration: Whole meds with liquid Supervision: Patient able to self  feed Compensations: Slow rate;Small sips/bites Postural Changes: Seated upright at 90 degrees    Other  Recommendations Oral Care Recommendations: Oral care BID   Follow up Recommendations Inpatient Rehab      Frequency and Duration min 2x/week  2 weeks       Prognosis Prognosis for Safe Diet Advancement: Good      Swallow Study   General HPI: 74 year old man with PMH listed below including melanoma presenting with chest pain with neg stress test.  He then developed scattered neurological deficits prompting extensive workup to date.   Pt was evalauted by SLP on 6/23, found ot have weak cough mechanism, but no signs of aspiration, tolerating diet. Thought to be stable in terms of neuro exam until 6/27 when he became more SOB, reported increased LE weakness. Critical care questioned secretion clearance issues and volume overload as well as constipation reducing FRC.   Chest x-ray shows possibility of infiltrates or atelectasis.  MRI performed (6/28) revealed progressive cord edema extending from C4-5 to T4-5 with mild patchy enhancement. Transverse myelitis and cord infarct are the primary considerations, with progression favoring myelitis, per Radiology. However, the abrupt onset on 6/20 is more suggestive of a spinal cord infarction. Initiated empiric IVIG. On a liquid diet.  Type of Study: Bedside Swallow Evaluation Previous Swallow Assessment: None Diet Prior to this Study: Regular;Thin liquids Temperature Spikes Noted: No Respiratory Status: Room air History of Recent Intubation: No Behavior/Cognition: Alert;Cooperative;Pleasant mood Oral Cavity Assessment: Dry Oral Care Completed by SLP: No Oral Cavity - Dentition: Adequate natural dentition Vision: Functional for self-feeding Self-Feeding Abilities: Needs assist Patient Positioning: Upright in bed Baseline Vocal Quality: Breathy;Low vocal intensity Volitional Cough: Weak Volitional Swallow: Able to elicit     Oral/Motor/Sensory Function Overall Oral Motor/Sensory Function: Within functional limits   Ice Chips     Thin Liquid Thin Liquid: Within functional limits Presentation: Cup;Straw;Self Fed    Nectar Thick Nectar Thick Liquid: Not tested   Honey Thick Honey Thick Liquid: Not tested   Puree Puree: Not tested   Solid     Solid: Within functional limits      Honey Brent [name], Katherene Ponto 03/17/2020,12:11 PM

## 2020-03-17 NOTE — Progress Notes (Addendum)
NIF and VC are QD.  Day shift RT gathered information earlier.

## 2020-03-17 NOTE — Progress Notes (Signed)
Patient has refused Bipap for tonight.  He just is not tolerating our mask and stated it was not like his mask at home that he uses.  Will watch patient tonight and place on [place] if needed.

## 2020-03-17 NOTE — Progress Notes (Signed)
Physical Therapy Treatment Patient Details Name: Brent [name]. MRN: [id number] DOB: 1946/02/21 Today's Date: 03/17/2020    History of Present Illness Pt adm 6/20 with chest pain and developed RLE weakness in ED. Pt for nuclear stress test on 6/21 where his weakness progressed to BLE's and Bil hands. Neuro consulted and work up in progress. PMH - CAD, DM, peripheral neuropathy, HTN, MI, obesity    PT Comments    Pt worked hard on sitting balance and trunk activation with sitting activities in the recliner over 30 minutes with observed lower trunk activation and improved control of the upper spared trunk.    Follow Up Recommendations  CIR     Equipment Recommendations  Other (comment);Wheelchair (measurements PT);Wheelchair cushion (measurements PT)    Recommendations for Other Services       Precautions / Restrictions Precautions Precautions: Fall Precaution Comments: sensation around T6-9    Mobility  Bed Mobility               General bed mobility comments: OOB in recliner most of the day  Transfers Overall transfer level: Needs assistance               General transfer comment: in recliner.  Ambulation/Gait                 Stairs             Wheelchair Mobility    Modified Rankin (Stroke Patients Only) Modified Rankin (Stroke Patients Only) Pre-Morbid Rankin Score: No symptoms Modified Rankin: Severe disability (If spinal infarct)     Balance Overall balance assessment: Needs assistance Sitting-balance support: Feet supported;Single extremity supported;Bilateral upper extremity supported Sitting balance-Leahy Scale: Poor Sitting balance - Comments: Pt able to sit in unsupported sitting away from the back of the chair using unilateral UE for assist                                    Cognition Arousal/Alertness: Awake/alert Behavior During Therapy: [facility] for tasks assessed/performed Overall Cognitive Status:  Within Functional Limits for tasks assessed                                        Exercises Other Exercises Other Exercises: open chained AROM bil UE's-- "punching toward ceiling at targets and punching forward toward target. x10 reps bil each.    General Comments General comments (skin integrity, edema, etc.): vss through out.      Pertinent Vitals/Pain Pain Assessment: Faces Faces Pain Scale: No hurt Pain Intervention(s): Monitored during session    Home Living                      Prior Function            PT Goals (current goals can now be found in the care plan section) Acute Rehab PT Goals Patient Stated Goal: return to independence PT Goal Formulation: With patient Time For Goal Achievement: 03/24/20 Potential to Achieve Goals: Good Progress towards PT goals: Progressing toward goals    Frequency    Min 3X/week      PT Plan Current plan remains appropriate    Co-evaluation PT/OT/SLP Co-Evaluation/Treatment: Yes Reason for Co-Treatment: Complexity of the patient's impairments (multi-system involvement);For patient/therapist safety;To address functional/ADL transfers PT goals addressed during session: Balance;Mobility/safety with  mobility;Strengthening/ROM OT goals addressed during session: ADL's and self-care;Strengthening/ROM      AM-PAC PT "6 Clicks" Mobility   Outcome Measure  Help needed turning from your back to your side while in a flat bed without using bedrails?: A Lot Help needed moving from lying on your back to sitting on the side of a flat bed without using bedrails?: Total Help needed moving to and from a bed to a chair (including a wheelchair)?: Total Help needed standing up from a chair using your arms (e.g., wheelchair or bedside chair)?: Total Help needed to walk in hospital room?: Total Help needed climbing 3-5 steps with a railing? : Total 6 Click Score: 7    End of Session   Activity Tolerance: Patient  tolerated treatment well Patient left: in chair;with call bell/phone within reach;with chair alarm set;Other (comment) (on skylift pad.) Nurse Communication: Mobility status;Need for lift equipment;Other (comment) PT Visit Diagnosis: Other abnormalities of gait and mobility (R26.89);Other symptoms and signs involving the nervous system (R29.898)     Time: 7416-3845 PT Time Calculation (min) (ACUTE ONLY): 43 min  Charges:  $Neuromuscular Re-education: 8-22 mins                     03/17/2020  Ginger Carne., PT Acute Rehabilitation Services [phone]  (pager) [phone]  (office)   Tessie Fass [name] 03/17/2020, 4:48 PM

## 2020-03-17 NOTE — Progress Notes (Signed)
PROGRESS NOTE    Brent [name].  NLZ:[id number] DOB: 12-02-1945 DOA: 03/08/2020 PCP: [name], [name], MD    Brief Narrative:  74 y.o. male with medical history significant of remote MI stenting x3 in 1990s, CAD, IDDM, DM neuropathy, sciatica with injection treatment 2 years ago, HTN, HLD, came with new onset of chest pain which started when patient was in the church and standing.  Pressure-like 8-9 over 10, associated with feeling of nausea and sweating no vomiting no short of breath no palpitations.  EMS arrived and gave patient some aspirin nitroglycerin sublingual with little improvement.  Then upon arrival in the ED the chest pain slowly progressed to radiate to the left shoulder and his back, then slowly subsided.  While staying in the ED patient started to feel weakness of the whole right-sided leg, he remember that while lying on the stretcher in the ambulance, the stretcher had a bump right beneath his right hip area, where he had " bad inflamed nerve which received injection about 2 years ago in [place]".   He denieD any numbness or loss of feeling but only weakness from hip to toe on the right side.  He denied baseline claudication, he does have chronic back pain on and off.  Patient also admitted he has a baseline diabetic neuropathy with frequent on and off tingling sensation of toes and fingertips. Upon arrival to ED, troponin II sets negative, CTA dissection study negative.  Patient was admitted to hospital service.  Cardiology was consulted.  He underwent nuclear stress test which was unremarkable.  He then started having weakness in left lower extremity as well as decreased grip strength in bilateral hands.  Neurology was consulted. C Spine MRI was ordered which was without any remarkable acute findings per official radiology report however per neurology MRI spine shows subtle signal change on cervical, thoracic spine and he had discussed with neuroradiology as well.  T-spine MRI  showed 6 x 10 mm enhancing lesion left T9 vertebral body, low signal on T1 and high signal on T2.  CT head was unremarkable.  LP was also unremarkable.  His initial working diagnosis was transverse myelitis however the work-up has not been indicative of that.  Patient was started on high-dose Solu-Medrol 1000 mg IV daily for 5 days starting 03/10/2020.  Patient was stable but was not progressing as far as the weakness goes.  He completed his high-dose Solu-Medrol on 03/14/2020.  Patient suddenly had acute shortness of breath/respiratory distress at around 5:45 PM on the evening of 03/15/2020.  Rapid response was called.  Patient was moved to ICU.  PCCM was consulted.  Patient initially required nonrebreather.  Chest x-ray and abdominal x-ray were done.  Chest x-ray shows possibility of infiltrates or atelectasis.  MRI C-spine and T-spine was repeated by neurology on 03/08/2020 which showed progressive cord edema extending from C4-5 to T4-5 with mild patchiness been.  Progression favors myelitis.  Patient started on IVIG on 03/16/2020.  Assessment & Plan:   Active Problems:   Chest pain   Weakness   Quadriplegia (HCC)   Coronary artery disease involving native coronary artery of native heart without angina pectoris   Steroid-induced hyperglycemia   Diabetic peripheral neuropathy (HCC)   Tachypnea   Hyponatremia   AKI (acute kidney injury) ([place])   Acute hypoxemic respiratory failure (HCC)  Chest pain -Pt has significant prior cardiac history -Given risk factors, Cardiology consulted and pt underwent nuclear stress test on 6/21, reviewed, with no reversible  ischemia or infarct. LVEF of 53%, normal LV wall motion. Low risk study -Continue his cardiac meds as tolerated  Progressive BLE weakness, numbness, loss of B hand grip strength  -new findings since initial presentation -Symptoms initially with RLE, since progressing to involve BLE weakness and B grip strength.  -Appreciate assistance by  Neurology.  C Spine MRI was ordered which was without any remarkable acute findings per official radiology report however per neurology MRI spine shows subtle signal change on cervical, thoracic spine and he had discussed with neuroradiology as well.  T-spine MRI showed 6 x 10 mm enhancing lesion left T9 vertebral body, low signal on T1 and high signal on T2.  CT head was unremarkable.  LP was also unremarkable.  He was started on high-dose Solu-Medrol for 5 days with first day starting on 03/10/2020.  The work-up so far has not been indicative of transverse myelitis but this along with possible spinal infarct is so far his working diagnosis per neurology and he is getting Solu-Medrol empirically for that.  He has not noted any improvement in his weakness since started on Solu-Medrol.  He is going to receive his last dose today.Dr. Lorraine Lax discussed case with [place] neuro immunologist on 03/15/2020 who had declined transfer and opined that there is nothing further that they have to offer and agreed with the treatment that patient is receiving here.  Due to consideration of possible infarct, I discussed with Dr. Lorraine Lax about possibility of arranging loop recorder for this patient.  He agrees with that plan.  We have not seen any atrial fibrillation on telemetry here.  I consulted cardiology on 03/15/2020 who planned to do a event monitor for 30 days in near future.  Patient had repeat MRI of the C and T-spine today ordered by neurology which shows progressive cord edema extending from C4-5 to T4-5 with mild patchiness been.  Progression favors myelitis.  Patient started on IVIG on 03/16/2020.  Acute hypoxic respiratory failure/bilateral atelectasis/neuromuscular weakness of unknown origin: Patient suddenly had acute shortness of breath/respiratory distress at around 5:45 PM on the evening of 03/15/2020.  Rapid response was called.  Patient was moved to ICU.  PCCM was consulted.  Patient initially required nonrebreather.   Chest x-ray and abdominal x-ray were done.  Chest x-ray shows possibility of infiltrates or atelectasis.  Patient has remained afebrile without any leukocytosis.  This was likely secondary to distended abdomen secondary to constipation might have caused atelectasis.  Procalcitonin unremarkable.  No indication of antibiotics.  PCCM managing and they also do not favor antibiotics.  Nightly mandatory BiPAP and daytime as needed BiPAP.  Pulmonary hygiene.  Currently he is on 1 L of nasal oxygen and saturating 96%.  No shortness of breath.  Has had 2 bowel movements yesterday but is still with distended abdomen.  IDDM/type 2 diabetes mellitus Blood sugar controlled.  Continue SSI.  Essential HTN -Continue home meds -BP stable and controlled at this time  HLD -Continue with statin as tolerated  Obesity: -Recommend diet/lifestyle modification  Hx malignant melanoma -chart reviewed. Pt s/p biopsy on 10/19, followed by [facility] -Seems to be stable currently  AKI/dehydration: Came in with normal GFR.  Creatinine jumped to 1.74.  AKI has resolved.   Constipation: He had 2 bowel movements yesterday after Dulcolax suppository.  Abdomen is still distended.  Will provide another order for Dulcolax suppository today.  DVT prophylaxis: Place and maintain sequential compression device Start: 03/09/20 1849 And Lovenox Code Status: Full Family Communication: Pt in  room, family currently not at bedside  Status is: Inpatient  Remains inpatient appropriate because:Ongoing diagnostic testing needed not appropriate for outpatient work up and Inpatient level of care appropriate due to severity of illness  Dispo: The patient is from: Home              Anticipated d/c is to: CIR              Anticipated d/c date is: 03/20/2020              Patient currently is not medically stable to d/c.     Consultants:   Cardiology  Neurology  Procedures:   Stress test 6/21  Antimicrobials: Anti-infectives (From  admission, onward)   None      Subjective: Patient seen and examined.  Overall feels slightly better but continues to have same weakness in lower extremities and bilateral hand grip.  Had 2 bowel movements yesterday.  Objective: Vitals:   03/17/20 1000 03/17/20 1100 03/17/20 1138 03/17/20 1200  BP: 135/60     Pulse: 63 62  65  Resp: (!) 21 (!) 27  17  Temp:   97.6 F (36.4 C)   TempSrc:   Axillary   SpO2: 94% 95%  91%  Weight:      Height:        Intake/Output Summary (Last 24 hours) at 03/17/2020 1404 Last data filed at 03/17/2020 1100 Gross per 24 hour  Intake 1913.69 ml  Output 2477 ml  Net -563.31 ml   Filed Weights   03/08/20 2044 03/12/20 0619 03/15/20 1905  Weight: 98.4 kg 102.7 kg 110.3 kg    Examination:  General exam: Appears calm and comfortable, morbidly obese Respiratory system: Diminished breath sounds at the bases bilaterally. Respiratory effort normal. Cardiovascular system: S1 & S2 heard, RRR. No JVD, murmurs, rubs, gallops or clicks. No pedal edema. Gastrointestinal system: Abdomen is nondistended, soft and nontender. No organomegaly or masses felt. Normal bowel sounds heard. Central nervous system: Alert and oriented.  0/5 power in bilateral lower extremities.  Weak handgrip. Skin: No rashes, lesions or ulcers.  Psychiatry: Judgement and insight appear normal. Mood & affect appropriate.   Data Reviewed: I have personally reviewed following labs and imaging studies  CBC: Recent Labs  Lab 03/13/20 0403 03/13/20 0403 03/14/20 0406 03/15/20 0138 03/15/20 2305 03/16/20 0215 03/17/20 0216  WBC 10.9*  --  11.3* 10.4  --  9.5 7.5  NEUTROABS 9.2*  --  7.9* 7.5  --  7.0 5.5  HGB 12.5*   < > 12.6* 13.6 12.9* 13.5 12.1*  HCT 38.2*   < > 39.6 42.8 38.0* 41.5 37.6*  MCV 89.9  --  90.8 91.5  --  90.0 90.4  PLT 199  --  188 218  --  210 203   < > = values in this interval not displayed.   Basic Metabolic Panel: Recent Labs  Lab 03/13/20 0403  03/13/20 0403 03/14/20 0406 03/15/20 0138 03/15/20 2305 03/16/20 0215 03/17/20 0216  NA 138   < > 139 137 140 139 136  K 4.6   < > 4.8 4.8 4.5 4.3 4.0  CL 108  --  110 107  --  106 104  CO2 20*  --  20* 21*  --  23 24  GLUCOSE 114*  --  98 172*  --  66* 98  BUN 63*  --  46* 36*  --  33* 33*  CREATININE 1.32*  --  1.06 0.99  --  0.94 0.97  CALCIUM 8.5*  --  8.4* 8.5*  --  8.7* 8.3*  MG  --   --   --   --   --  2.2  --   PHOS  --   --   --   --   --  4.0  --    < > = values in this interval not displayed.   GFR: Estimated Creatinine Clearance: 84.4 mL/min (by C-G formula based on SCr of 0.97 mg/dL). Liver Function Tests: Recent Labs  Lab 03/10/20 1757 03/11/20 0222  AST  --  32  ALT  --  24  ALKPHOS  --  45  BILITOT  --  0.6  PROT  --  6.3*  ALBUMIN 4.2 3.6   No results for input(s): LIPASE, AMYLASE in the last 168 hours. No results for input(s): AMMONIA in the last 168 hours. Coagulation Profile: No results for input(s): INR, PROTIME in the last 168 hours. Cardiac Enzymes: Recent Labs  Lab 03/15/20 1921  CKTOTAL 119   BNP (last 3 results) No results for input(s): PROBNP in the last 8760 hours. HbA1C: No results for input(s): HGBA1C in the last 72 hours. CBG: Recent Labs  Lab 03/16/20 1948 03/16/20 2336 03/17/20 0339 03/17/20 0716 03/17/20 1128  GLUCAP 222* 139* 92 90 111*   Lipid Profile: No results for input(s): CHOL, HDL, LDLCALC, TRIG, CHOLHDL, LDLDIRECT in the last 72 hours. Thyroid Function Tests: Recent Labs    03/15/20 1921  TSH 2.300   Anemia Panel: Recent Labs    03/15/20 1921  VITAMINB12 814   Sepsis Labs: Recent Labs  Lab 03/15/20 1921 03/16/20 0215 03/17/20 0216  PROCALCITON <0.10 0.16 0.33  LATICACIDVEN 0.9  --   --     Recent Results (from the past 240 hour(s))  SARS Coronavirus 2 by RT PCR (hospital order, performed in [facility] hospital lab) Nasopharyngeal Nasopharyngeal Swab     Status: None   Collection Time:  03/08/20 11:33 AM   Specimen: Nasopharyngeal Swab  Result Value Ref Range Status   SARS Coronavirus 2 NEGATIVE NEGATIVE Final    Comment: (NOTE) SARS-CoV-2 target nucleic acids are NOT DETECTED.  The SARS-CoV-2 RNA is generally detectable in upper and lower respiratory specimens during the acute phase of infection. The lowest concentration of SARS-CoV-2 viral copies this assay can detect is 250 copies / mL. A negative result does not preclude SARS-CoV-2 infection and should not be used as the sole basis for treatment or other patient management decisions.  A negative result may occur with improper specimen collection / handling, submission of specimen other than nasopharyngeal swab, presence of viral mutation(s) within the areas targeted by this assay, and inadequate number of viral copies (<250 copies / mL). A negative result must be combined with clinical observations, patient history, and epidemiological information.  Fact Sheet for Patients:   StrictlyIdeas.no  Fact Sheet for Healthcare Providers: BankingDealers.co.za  This test is not yet approved or  cleared by the [place] FDA and has been authorized for detection and/or diagnosis of SARS-CoV-2 by FDA under an Emergency Use Authorization (EUA).  This EUA will remain in effect (meaning this test can be used) for the duration of the COVID-19 declaration under Section 564(b)(1) of the Act, 21 U.S.C. section 360bbb-3(b)(1), unless the authorization is terminated or revoked sooner.  Performed at [facility], [place] [address], [place], [place] [address]   Gram stain     Status: None   Collection Time:  03/10/20  5:15 PM   Specimen: PATH Cytology CSF; Cerebrospinal Fluid  Result Value Ref Range Status   Specimen Description CSF  Final   Special Requests NONE  Final   Gram Stain   Final    WBC PRESENT, PREDOMINANTLY PMN NO ORGANISMS SEEN CYTOSPIN SMEAR Performed at  [facility], [place] [address] [place], [place] [address]    Report Status 03/10/2020 FINAL  Final  MRSA PCR Screening     Status: None   Collection Time: 03/16/20 11:49 AM   Specimen: Nasal Mucosa; Nasopharyngeal  Result Value Ref Range Status   MRSA by PCR NEGATIVE NEGATIVE Final    Comment:        The GeneXpert MRSA Assay (FDA approved for NASAL specimens only), is one component of a comprehensive MRSA colonization surveillance program. It is not intended to diagnose MRSA infection nor to guide or monitor treatment for MRSA infections. Performed at [facility], [place] [address] [place], [place] [address]      Radiology Studies: DG Chest 1 View  Result Date: 03/16/2020 CLINICAL DATA:  Respiratory failure EXAM: CHEST  1 VIEW COMPARISON:  There yesterday FINDINGS: Patchy interstitial and airspace opacity with mild improvement in aeration. As before, this could be infection or atypical edema. No effusion or pneumothorax. Stable cardiomediastinal contours which are distorted by rightward rotation. IMPRESSION: Mildly improved aeration. Electronically Signed   By: Monte Fantasia M.D.   On: 03/16/2020 05:30   MR CERVICAL SPINE W WO CONTRAST  Result Date: 03/16/2020 CLINICAL DATA:  Demyelinating disease EXAM: MRI CERVICAL AND THORACIC SPINE WITHOUT AND WITH CONTRAST TECHNIQUE: Multisequence MR imaging of the CERVICAL AND THORACIC SPINE was performed prior to and following IV contrast administration. CONTRAST:  24mL GADAVIST GADOBUTROL 1 MMOL/ML IV SOLN COMPARISON:  03/09/2020 FINDINGS: MRI CERVICAL SPINE FINDINGS Alignment: Physiologic Vertebrae: No fracture, evidence of discitis, or bone lesion. No evidence of altered marrow enhancement. Cord: Progressive T2 hyperintensity within the central cord extending from C4-5 to T4-5, with mild cord expansion and subtle patchy enhancement at the cervical levels. No cord compression or canal collection. No abnormal vessels over the cord.  Posterior Fossa, vertebral arteries, paraspinal tissues: No contributory finding Disc levels: Ordinary degenerative changes.  No cord compression. MRI THORACIC SPINE FINDINGS Alignment:  Mild exaggerated thoracic kyphosis. Vertebrae: No fracture, evidence of discitis, or bone lesion. No abnormal vertebral enhancement. Cord:  T2 hyperintensity in the central cord as noted above. Paraspinal and other soft tissues: Small pleural effusions. Mild dependent atelectasis. Disc levels: Spondylosis which is likely bridging at multiple levels. IMPRESSION: Progressive cord edema extending from C4-5 to T4-5 with mild patchy enhancement. Transverse myelitis and cord infarct are the primary considerations, with progression favoring myelitis. Electronically Signed   By: Monte Fantasia M.D.   On: 03/16/2020 10:45   MR THORACIC SPINE W WO CONTRAST  Result Date: 03/16/2020 CLINICAL DATA:  Demyelinating disease EXAM: MRI CERVICAL AND THORACIC SPINE WITHOUT AND WITH CONTRAST TECHNIQUE: Multisequence MR imaging of the CERVICAL AND THORACIC SPINE was performed prior to and following IV contrast administration. CONTRAST:  60mL GADAVIST GADOBUTROL 1 MMOL/ML IV SOLN COMPARISON:  03/09/2020 FINDINGS: MRI CERVICAL SPINE FINDINGS Alignment: Physiologic Vertebrae: No fracture, evidence of discitis, or bone lesion. No evidence of altered marrow enhancement. Cord: Progressive T2 hyperintensity within the central cord extending from C4-5 to T4-5, with mild cord expansion and subtle patchy enhancement at the cervical levels. No cord compression or canal collection. No abnormal vessels over the cord.  Posterior Fossa, vertebral arteries, paraspinal tissues: No contributory finding Disc levels: Ordinary degenerative changes.  No cord compression. MRI THORACIC SPINE FINDINGS Alignment:  Mild exaggerated thoracic kyphosis. Vertebrae: No fracture, evidence of discitis, or bone lesion. No abnormal vertebral enhancement. Cord:  T2 hyperintensity in the  central cord as noted above. Paraspinal and other soft tissues: Small pleural effusions. Mild dependent atelectasis. Disc levels: Spondylosis which is likely bridging at multiple levels. IMPRESSION: Progressive cord edema extending from C4-5 to T4-5 with mild patchy enhancement. Transverse myelitis and cord infarct are the primary considerations, with progression favoring myelitis. Electronically Signed   By: Monte Fantasia M.D.   On: 03/16/2020 10:45   DG Chest Port 1 View  Result Date: 03/17/2020 CLINICAL DATA:  Acute on chronic respiratory failure with hypoxia. EXAM: PORTABLE CHEST 1 VIEW COMPARISON:  Chest x-ray 03/16/2020 FINDINGS: Worsening lung aeration with progressive interstitial and airspace process more pronounced in the right lung. No definite pleural effusions. Stable tortuous and calcified thoracic aorta. No cardiac enlargement. IMPRESSION: Worsening lung aeration with progressive interstitial and airspace process more pronounced in the right lung. Electronically Signed   By: Marijo Sanes M.D.   On: 03/17/2020 07:51   DG CHEST PORT 1 VIEW  Result Date: 03/15/2020 CLINICAL DATA:  Shortness of breath EXAM: PORTABLE CHEST 1 VIEW COMPARISON:  03/08/2020 FINDINGS: Airspace disease throughout the lungs bilaterally, right greater than left. Mild cardiomegaly. No visible effusions. No acute bony abnormality. IMPRESSION: Patchy bilateral airspace disease, right greater than left with cardiomegaly. Findings could reflect asymmetric edema or infection. Electronically Signed   By: Rolm Baptise M.D.   On: 03/15/2020 18:43   DG Abd Portable 1V  Result Date: 03/15/2020 CLINICAL DATA:  Shortness of breath EXAM: PORTABLE ABDOMEN - 1 VIEW COMPARISON:  CT 03/08/2020 FINDINGS: Nonobstructive bowel gas pattern. Prior cholecystectomy. No organomegaly or free air. Hyperdense area seen in the left upper quadrant corresponding to the layering high-density material/calcifications within the left renal cyst.  Diffuse airspace disease throughout the lungs. IMPRESSION: No evidence of bowel obstruction or free air. Layering calcifications within the left renal cyst as seen on prior CT. Diffuse airspace disease throughout the lungs. Electronically Signed   By: Rolm Baptise M.D.   On: 03/15/2020 18:46    Scheduled Meds: . aspirin EC  81 mg Oral QHS  . chlorhexidine  15 mL Mouth Rinse BID  . Chlorhexidine Gluconate Cloth  6 each Topical Q0600  . docusate sodium  100 mg Oral BID  . enoxaparin (LOVENOX) injection  50 mg Subcutaneous Q24H  . fenofibrate  160 mg Oral QHS  . fluticasone  2 spray Each Nare QHS  . icosapent Ethyl  2 g Oral BID  . insulin aspart  0-20 Units Subcutaneous TID WC  . insulin aspart  0-5 Units Subcutaneous QHS  . lidocaine  1 patch Transdermal Q24H  . mouth rinse  15 mL Mouth Rinse q12n4p  . metoCLOPramide (REGLAN) injection  10 mg Intravenous Q6H  . multivitamin with minerals  1 tablet Oral Daily  . nystatin  1 application Topical TID  . oxybutynin  5 mg Oral BID  . pantoprazole  40 mg Oral Daily  . polyethylene glycol  17 g Oral Daily  . rosuvastatin  40 mg Oral QHS  . sertraline  150 mg Oral QHS  . vitamin B-12  500 mcg Oral Daily  . zolpidem  5 mg Oral QHS   Continuous Infusions: . sodium chloride Stopped (03/17/20 0902)  . Immune Globulin  10% Stopped (03/16/20 2100)     LOS: 8 days   Darliss Cheney, MD [facility] Pager On Amion  If 7PM-7AM, please contact night-coverage 03/17/2020, 2:04 PM

## 2020-03-17 NOTE — Progress Notes (Signed)
VC is 1.5 liters with excellent effort by the patient.

## 2020-03-17 NOTE — Progress Notes (Signed)
Occupational Therapy Treatment Patient Details Name: Brent [name]. MRN: [id number] DOB: 04/12/1946 Today's Date: 03/17/2020    History of present illness Pt adm 6/20 with chest pain and developed RLE weakness in ED. Pt for nuclear stress test on 6/21 where his weakness progressed to BLE's and Bil hands. Neuro consulted and work up in progress. PMH - CAD, DM, peripheral neuropathy, HTN, MI, obesity   OT comments  Pt seen in conjunction with PT.  Worked on edge of chair sitting with focus on sitting balance (with and without UEs), postural control, and activation of trunk musculature.  Pt able to activate some of his lower trunk muscles with facilitation.  He is very motivated to improve.  Recommend CIR.   Follow Up Recommendations  No OT follow up    Equipment Recommendations  3 in 1 bedside commode    Recommendations for Other Services Rehab consult    Precautions / Restrictions Precautions Precautions: Fall Precaution Comments: sensation around T6-9       Mobility Bed Mobility               General bed mobility comments: OOB in recliner most of the day  Transfers Overall transfer level: Needs assistance               General transfer comment: in recliner.    Balance Overall balance assessment: Needs assistance Sitting-balance support: Feet supported;Single extremity supported;Bilateral upper extremity supported Sitting balance-Leahy Scale: Poor Sitting balance - Comments: Pt able to sit in unsupported sitting away from the back of the chair using unilateral UE for assist                                   ADL either performed or assessed with clinical judgement   ADL Overall ADL's : Needs assistance/impaired   Eating/Feeding Details (indicate cue type and reason): Pt able to drink from styrofoam cup holding it with both hands                                          Vision       Perception     Praxis       Cognition Arousal/Alertness: Awake/alert Behavior During Therapy: WFL for tasks assessed/performed Overall Cognitive Status: Within Functional Limits for tasks assessed                                          Exercises Exercises: Other exercises Other Exercises Other Exercises: Pt sitting up in recliner.  Pt moved away from the back the chair pulling himself forward with mod A.  Worked on postural control, balance, and shifting forward off BOS, and being able to prevent fall/LOB and pull self back.  Worked on active trunk extension, as wall as static sitting with and without UE support    Shoulder Instructions       General Comments VSS     Pertinent Vitals/ Pain       Pain Assessment: Faces Faces Pain Scale: No hurt Pain Intervention(s): Monitored during session  Home Living  Prior Functioning/Environment              Frequency  Min 1X/week        Progress Toward Goals  OT Goals(current goals can now be found in the care plan section)  Progress towards OT goals: Progressing toward goals  Acute Rehab OT Goals Patient Stated Goal: return to independence  Plan Discharge plan remains appropriate    Co-evaluation    PT/OT/SLP Co-Evaluation/Treatment: Yes Reason for Co-Treatment: Complexity of the patient's impairments (multi-system involvement);For patient/therapist safety;To address functional/ADL transfers PT goals addressed during session: Balance;Mobility/safety with mobility;Strengthening/ROM OT goals addressed during session: ADL's and self-care;Strengthening/ROM      AM-PAC OT "6 Clicks" Daily Activity     Outcome Measure   Help from another person eating meals?: A Little Help from another person taking care of personal grooming?: A Little Help from another person toileting, which includes using toliet, bedpan, or urinal?: Total Help from another person bathing (including  washing, rinsing, drying)?: Total Help from another person to put on and taking off regular upper body clothing?: A Lot Help from another person to put on and taking off regular lower body clothing?: Total 6 Click Score: 11    End of Session    OT Visit Diagnosis: Unsteadiness on feet (R26.81)   Activity Tolerance Patient tolerated treatment well   Patient Left in chair;with call bell/phone within reach   Nurse Communication Mobility status        Time: 3559-7416 OT Time Calculation (min): 41 min  Charges: OT General Charges $OT Visit: 1 Visit OT Treatments $Neuromuscular Re-education: 23-37 mins  Nilsa Nutting., OTR/L Acute Rehabilitation Services Pager [phone] Office [phone]    Lucille Passy M 03/17/2020, 4:52 PM

## 2020-03-17 NOTE — Progress Notes (Signed)
NIF= -40 Could not obtain VC, looking for device.

## 2020-03-17 NOTE — Progress Notes (Signed)
NAME:  Brent Fancher., MRN:  [id number], DOB:  1946-03-02, LOS: 8 ADMISSION DATE:  03/08/2020, CONSULTATION DATE:  03/15/20 REFERRING MD:  Doristine Bosworth, CHIEF COMPLAINT:  SOB   Brief History   74 year old man w/ hx of metabolic syndrome presenting with chest pain followed by increasing weakness and SOB.  Worsened respiratory status prompted CCM consult.  History of present illness   74 year old man with PMH listed below including melanoma presenting with chest pain with neg stress test.  He then developed scattered neurological deficits prompting extensive workup to date (LP, MRI of entire spine, brain).  Differential is presently spinal cord infarct, transverse myelitis, paraneoplastic disease.  Thought to be stable in terms of neuro exam until this evening when he became more SOB.  Sounds like he has not been having many BMs.     Past Medical History  IDDM, CAD, HTN, HLD, OSA, Prior melanoma of L neck  Significant Hospital Events   6/21 admitted 6/22 start high dose solumedrol 6/27 complete high dose solumedrol 6/28 start IVIG  Consults:  Neuro, Cardiology  Procedures:    Significant Diagnostic Tests:   CT angio chest/abd/pelvis 6/20 >> atherosclerosis, mild ATX, mosaic attenuation in mid/lower lung fields, Lt renal cyst, diverticulosis  MRI T spine 6/22 >> 6 x 10 mm enhancing lesion Lt T9 vertebral body  LP 6/22 >> glucose 77, protein 53, RBC 13, WBC 1  MRI brain 6/23 >> mild atrophy  Echo 6/24 >> EF 60 to 65%, RVSP 32 mmHg, negative bubble study  MRI C/T spine 6/28 >> progressive cord edema C5 to T4-5  Micro Data:  COVID 6/20 >> negative CSF 6/22 >> negative  Antimicrobials:    Interim history/subjective:  Slept okay.  Denies chest pain, chest congestion.  Feels breathing is okay.  Objective   Blood pressure (!) 128/44, pulse (!) 59, temperature 98 F (36.7 C), temperature source Oral, resp. rate (!) 23, height 6' (1.829 m), weight 110.3 kg, SpO2 94 %.      FiO2 (%):  [40 %] 40 %   Intake/Output Summary (Last 24 hours) at 03/17/2020 0737 Last data filed at 03/17/2020 0700 Gross per 24 hour  Intake 2041.28 ml  Output 3952 ml  Net -1910.72 ml   Filed Weights   03/08/20 2044 03/12/20 0619 03/15/20 1905  Weight: 98.4 kg 102.7 kg 110.3 kg    Examination:  General - alert Eyes - pupils reactive ENT - no sinus tenderness, no stridor Cardiac - regular rate/rhythm, no murmur Chest - equal breath sounds b/l, no wheezing or rales Abdomen - soft, non tender, + bowel sounds Extremities - no cyanosis, clubbing, or edema Skin - no rashes Neuro - has some movement in arms, not so much in legs Psych - normal mood and behavior  Resolved Hospital Problem list     Assessment & Plan:   B/l progressive upper and lower extremity weakness with T6-T9 sensory level with C4 to T5 enhancement on MRI concerning for transverse myelitis versus cord infarction. 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 Hx of OSA. - still has acceptable cough - no increase WOB at present - monitor NIF, VC - goal SpO2 > 92% - Bipap prn - monitor need for ETT - if he starts to run persistent fever or develop respiratory secretions,  then add ABx for aspiration pneumonia  Hx of HTN, HLD, CAD. - continue ASA, fenofibrate, crestor, vascepa  DM type II with steroid induced hyperglycemia. - SSI  Hx of depression, insomnia. - continue zoloft, ambien  BPH. - continue ditropan  Constipation. - continue colace  Intertrigo. - continue nystatin powder  Best practice:  Diet: carb modified DVT prophylaxis: Lovenox GI prophylaxis: protonix Mobility: BR Code Status: Full Disposition: ICU  Labs    CMP Latest Ref Rng & Units 03/17/2020 03/16/2020 03/15/2020   Glucose 70 - 99 mg/dL 98 66(L) -  BUN 8 - 23 mg/dL 33(H) 33(H) -  Creatinine 0.61 - 1.24 mg/dL 0.97 0.94 -  Sodium 135 - 145 mmol/L 136 139 140  Potassium 3.5 - 5.1 mmol/L 4.0 4.3 4.5  Chloride 98 - 111 mmol/L 104 106 -  CO2 22 - 32 mmol/L 24 23 -  Calcium 8.9 - 10.3 mg/dL 8.3(L) 8.7(L) -  Total Protein 6.5 - 8.1 g/dL - - -  Total Bilirubin 0.3 - 1.2 mg/dL - - -  Alkaline Phos 38 - 126 U/L - - -  AST 15 - 41 U/L - - -  ALT 0 - 44 U/L - - -    CBC Latest Ref Rng & Units 03/17/2020 03/16/2020 03/15/2020  WBC 4.0 - 10.5 K/uL 7.5 9.5 -  Hemoglobin 13.0 - 17.0 g/dL 12.1(L) 13.5 12.9(L)  Hematocrit 39 - 52 % 37.6(L) 41.5 38.0(L)  Platelets 150 - 400 K/uL 203 210 -    ABG    Component Value Date/Time   PHART 7.405 03/15/2020 2305   PCO2ART 40.4 03/15/2020 2305   PO2ART 113 (H) 03/15/2020 2305   HCO3 25.5 03/15/2020 2305   TCO2 27 03/15/2020 2305   O2SAT 99.0 03/15/2020 2305    CBG (last 3)  Recent Labs    03/16/20 2336 03/17/20 0339 03/17/20 0716  GLUCAP 139* 92 90    Critical care time: 34 minutes  Chesley Mires, MD [place] Pager - [phone] - [phone] 03/17/2020, 8:01 AM

## 2020-03-18 ENCOUNTER — Inpatient Hospital Stay (HOSPITAL_COMMUNITY): Payer: Medicare Other

## 2020-03-18 DIAGNOSIS — J189 Pneumonia, unspecified organism: Secondary | ICD-10-CM

## 2020-03-18 LAB — COMPREHENSIVE METABOLIC PANEL
ALT: 66 U/L — ABNORMAL HIGH (ref 0–44)
AST: 74 U/L — ABNORMAL HIGH (ref 15–41)
Albumin: 2 g/dL — ABNORMAL LOW (ref 3.5–5.0)
Alkaline Phosphatase: 76 U/L (ref 38–126)
Anion gap: 8 (ref 5–15)
BUN: 26 mg/dL — ABNORMAL HIGH (ref 8–23)
CO2: 22 mmol/L (ref 22–32)
Calcium: 8.3 mg/dL — ABNORMAL LOW (ref 8.9–10.3)
Chloride: 104 mmol/L (ref 98–111)
Creatinine, Ser: 0.87 mg/dL (ref 0.61–1.24)
GFR calc Af Amer: >60 mL/min (ref >60)
GFR calc non Af Amer: >60 mL/min (ref >60)
Glucose, Bld: 175 mg/dL — ABNORMAL HIGH (ref 70–99)
Potassium: 4.1 mmol/L (ref 3.5–5.1)
Sodium: 134 mmol/L — ABNORMAL LOW (ref 135–145)
Total Bilirubin: 0.7 mg/dL (ref 0.3–1.2)
Total Protein: 6.8 g/dL (ref 6.5–8.1)

## 2020-03-18 LAB — CBC
HCT: 37.4 % — ABNORMAL LOW (ref 39.0–52.0)
Hemoglobin: 11.8 g/dL — ABNORMAL LOW (ref 13.0–17.0)
MCH: 28.6 pg (ref 26.0–34.0)
MCHC: 31.6 g/dL (ref 30.0–36.0)
MCV: 90.6 fL (ref 80.0–100.0)
Platelets: 227 10*3/uL (ref 150–400)
RBC: 4.13 MIL/uL — ABNORMAL LOW (ref 4.22–5.81)
RDW: 13.5 % (ref 11.5–15.5)
WBC: 7.4 10*3/uL (ref 4.0–10.5)
nRBC: 0 % (ref 0.0–0.2)

## 2020-03-18 LAB — GLUCOSE, CAPILLARY
Glucose-Capillary: 157 mg/dL — ABNORMAL HIGH (ref 70–99)
Glucose-Capillary: 223 mg/dL — ABNORMAL HIGH (ref 70–99)
Glucose-Capillary: 183 mg/dL — ABNORMAL HIGH (ref 70–99)
Glucose-Capillary: 254 mg/dL — ABNORMAL HIGH (ref 70–99)

## 2020-03-18 LAB — MISC LABCORP TEST (SEND OUT)
Labcorp test code: 505310
Labcorp test code: 9985

## 2020-03-18 LAB — COPPER, SERUM: Copper: 115 ug/dL (ref 69–132)

## 2020-03-18 LAB — PROCALCITONIN: Procalcitonin: 0.24 ng/mL

## 2020-03-18 LAB — B. BURGDORFI ANTIBODIES: B burgdorferi Ab IgG+IgM: <0.91 {ISR} (ref 0.00–0.90)

## 2020-03-18 MED ORDER — SODIUM CHLORIDE 0.9 % IV SOLN
2.0000 g | Freq: Three times a day (TID) | INTRAVENOUS | Status: DC
  Administered 2020-03-18 – 2020-03-21 (×10): 2 g via INTRAVENOUS
  Filled 2020-03-18 (×10): qty 2

## 2020-03-18 MED ORDER — INSULIN GLARGINE 100 UNIT/ML ~~LOC~~ SOLN
20.0000 [IU] | Freq: Every day | SUBCUTANEOUS | Status: DC
  Administered 2020-03-18: 20 [IU] via SUBCUTANEOUS
  Filled 2020-03-18 (×2): qty 0.2

## 2020-03-18 NOTE — Progress Notes (Signed)
NIF= -30 VC= 1.3L Pt effort= good

## 2020-03-18 NOTE — Progress Notes (Signed)
NAME:  Brent Winker., MRN:  [id number], DOB:  05-15-1946, LOS: 9 ADMISSION DATE:  03/08/2020, CONSULTATION DATE:  03/15/20 REFERRING MD:  Doristine Bosworth, CHIEF COMPLAINT:  SOB   Brief History   74 year old man w/ hx of metabolic syndrome presenting with chest pain followed by increasing weakness and SOB.  Worsened respiratory status prompted CCM consult.  History of present illness   74 year old man with PMH listed below including melanoma presenting with chest pain with neg stress test.  He then developed scattered neurological deficits prompting extensive workup to date (LP, MRI of entire spine, brain).  Differential is presently spinal cord infarct, transverse myelitis, paraneoplastic disease.  Thought to be stable in terms of neuro exam until this evening when he became more SOB.  Sounds like he has not been having many BMs.     Past Medical History  IDDM, CAD, HTN, HLD, OSA, Prior melanoma of L neck  Significant Hospital Events   6/21 admitted 6/22 start high dose solumedrol 6/27 complete high dose solumedrol 6/28 start IVIG  Consults:  Neuro, Cardiology  Procedures:    Significant Diagnostic Tests:   CT angio chest/abd/pelvis 6/20 >> atherosclerosis, mild ATX, mosaic attenuation in mid/lower lung fields, Lt renal cyst, diverticulosis  MRI T spine 6/22 >> 6 x 10 mm enhancing lesion Lt T9 vertebral body  LP 6/22 >> glucose 77, protein 53, RBC 13, WBC 1  MRI brain 6/23 >> mild atrophy  Echo 6/24 >> EF 60 to 65%, RVSP 32 mmHg, negative bubble study  MRI C/T spine 6/28 >> progressive cord edema C5 to T4-5  Micro Data:  COVID 6/20 >> negative CSF 6/22 >> negative  Antimicrobials:  Cefepime 6/30 >>   Interim history/subjective:  Declined Bipap last night.  On supplemental oxygen.  Has some cough.  Denies chest congestion.  Objective   Blood pressure (!) 142/60, pulse (!) 57, temperature 98.2 F (36.8 C), temperature source Oral, resp. rate 20, height 6' (1.829 m),  weight 110.3 kg, SpO2 98 %.        Intake/Output Summary (Last 24 hours) at 03/18/2020 0841 Last data filed at 03/18/2020 0800 Gross per 24 hour  Intake 1070.48 ml  Output 3550 ml  Net -2479.52 ml   Filed Weights   03/08/20 2044 03/12/20 0619 03/15/20 1905  Weight: 98.4 kg 102.7 kg 110.3 kg    Examination:  General - alert Eyes - pupils reactive ENT - no sinus tenderness, no stridor Cardiac - regular rate/rhythm, no murmur Chest - scattered rhonchi Abdomen - soft, non tender, + bowel sounds Extremities - 1+ edema Skin - no rashes Neuro - improvement movement in upper extremities, no movement in lower extremities  normal strength, moves extremities, follows commands Psych - normal mood and behavior  Resolved Hospital Problem list     Assessment & Plan:   B/l progressive upper and lower extremity weakness with T6-T9 sensory level with C4 to T5 enhancement on MRI concerning for transverse myelitis versus cord infarction. 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 Hx of OSA. - goal SpO2 > 92% - monitor NIF, VC - prn Bipap  HCAP. - add cefepime 6/30 - f/u CXR intermittently  Hx of HTN, HLD, CAD. - continue ASA, fenofibrate, crestor, vascepa  DM type II with steroid  induced hyperglycemia. - SSI  Hx of depression, insomnia. - continue zoloft, ambien  BPH. - continue ditropan  Constipation. - continue colace  Intertrigo. - continue nystatin powder  Best practice:  Diet: carb modified DVT prophylaxis: Lovenox GI prophylaxis: protonix Mobility: BR Code Status: Full Disposition: okay to transfer to progressive care from PCCM stand point >> Defer to hospitalist.  Labs    CMP Latest Ref Rng & Units 03/18/2020 03/17/2020 03/16/2020  Glucose 70 - 99  mg/dL 175(H) 98 66(L)  BUN 8 - 23 mg/dL 26(H) 33(H) 33(H)  Creatinine 0.61 - 1.24 mg/dL 0.87 0.97 0.94  Sodium 135 - 145 mmol/L 134(L) 136 139  Potassium 3.5 - 5.1 mmol/L 4.1 4.0 4.3  Chloride 98 - 111 mmol/L 104 104 106  CO2 22 - 32 mmol/L 22 24 23   Calcium 8.9 - 10.3 mg/dL 8.3(L) 8.3(L) 8.7(L)  Total Protein 6.5 - 8.1 g/dL 6.8 - -  Total Bilirubin 0.3 - 1.2 mg/dL 0.7 - -  Alkaline Phos 38 - 126 U/L 76 - -  AST 15 - 41 U/L 74(H) - -  ALT 0 - 44 U/L 66(H) - -    CBC Latest Ref Rng & Units 03/18/2020 03/17/2020 03/16/2020  WBC 4.0 - 10.5 K/uL 7.4 7.5 9.5  Hemoglobin 13.0 - 17.0 g/dL 11.8(L) 12.1(L) 13.5  Hematocrit 39 - 52 % 37.4(L) 37.6(L) 41.5  Platelets 150 - 400 K/uL 227 203 210    ABG    Component Value Date/Time   PHART 7.405 03/15/2020 2305   PCO2ART 40.4 03/15/2020 2305   PO2ART 113 (H) 03/15/2020 2305   HCO3 25.5 03/15/2020 2305   TCO2 27 03/15/2020 2305   O2SAT 99.0 03/15/2020 2305    CBG (last 3)  Recent Labs    03/17/20 1527 03/17/20 1920 03/18/20 0725  GLUCAP 198* 250* 157*    Signature:  Chesley Mires, MD [place] Pager - [phone] 03/18/2020, 8:41 AM

## 2020-03-18 NOTE — Progress Notes (Signed)
PROGRESS NOTE    Brent [name].  TFT:[id number] DOB: 1946/06/26 DOA: 03/08/2020 PCP: [name], [name], MD    Brief Narrative:  74 y.o. male with medical history significant of remote MI stenting x3 in 1990s, CAD, IDDM, DM neuropathy, sciatica with injection treatment 2 years ago, HTN, HLD, came with new onset of chest pain which started when patient was in the church and standing.  Pressure-like 8-9 over 10, associated with feeling of nausea and sweating no vomiting no short of breath no palpitations.  EMS arrived and gave patient some aspirin nitroglycerin sublingual with little improvement.  Then upon arrival in the ED the chest pain slowly progressed to radiate to the left shoulder and his back, then slowly subsided.  While staying in the ED patient started to feel weakness of the whole right-sided leg, he remember that while lying on the stretcher in the ambulance, the stretcher had a bump right beneath his right hip area, where he had " bad inflamed nerve which received injection about 2 years ago in [place]".   He denieD any numbness or loss of feeling but only weakness from hip to toe on the right side.  He denied baseline claudication, he does have chronic back pain on and off.  Patient also admitted he has a baseline diabetic neuropathy with frequent on and off tingling sensation of toes and fingertips. Upon arrival to ED, troponin II sets negative, CTA dissection study negative.  Patient was admitted to hospital service.  Cardiology was consulted.  He underwent nuclear stress test which was unremarkable.  He then started having weakness in left lower extremity as well as decreased grip strength in bilateral hands.  Neurology was consulted. C Spine MRI was ordered which was without any remarkable acute findings per official radiology report however per neurology MRI spine shows subtle signal change on cervical, thoracic spine and he had discussed with neuroradiology as well.  T-spine MRI  showed 6 x 10 mm enhancing lesion left T9 vertebral body, low signal on T1 and high signal on T2.  CT head was unremarkable.  LP was also unremarkable.  His initial working diagnosis was transverse myelitis however the work-up has not been indicative of that.  Patient was started on high-dose Solu-Medrol 1000 mg IV daily for 5 days starting 03/10/2020.  Patient was stable but was not progressing as far as the weakness goes.  He completed his high-dose Solu-Medrol on 03/14/2020.  Patient suddenly had acute shortness of breath/respiratory distress at around 5:45 PM on the evening of 03/15/2020.  Rapid response was called.  Patient was moved to ICU.  PCCM was consulted.  Patient initially required nonrebreather.  Chest x-ray and abdominal x-ray were done.  Chest x-ray shows possibility of infiltrates or atelectasis.  MRI C-spine and T-spine was repeated by neurology on 03/08/2020 which showed progressive cord edema extending from C4-5 to T4-5 with mild patchiness been.  Progression favors myelitis.  Patient started on IVIG on 03/16/2020.  Assessment & Plan:   Active Problems:   Chest pain   Weakness   Quadriplegia (HCC)   Coronary artery disease involving native coronary artery of native heart without angina pectoris   Steroid-induced hyperglycemia   Diabetic peripheral neuropathy (HCC)   Tachypnea   Hyponatremia   AKI (acute kidney injury) ([place])   Acute hypoxemic respiratory failure (HCC)  Chest pain -Pt has significant prior cardiac history -Given risk factors, Cardiology consulted and pt underwent nuclear stress test on 6/21, reviewed, with no reversible  ischemia or infarct. LVEF of 53%, normal LV wall motion. Low risk study -Continue his cardiac meds as tolerated  Progressive BLE weakness, numbness, loss of B hand grip strength  -new findings since initial presentation -Symptoms initially with RLE, since progressing to involve BLE weakness and B grip strength.  -Appreciate assistance by  Neurology.  C Spine MRI was ordered which was without any remarkable acute findings per official radiology report however per neurology MRI spine shows subtle signal change on cervical, thoracic spine and he had discussed with neuroradiology as well.  T-spine MRI showed 6 x 10 mm enhancing lesion left T9 vertebral body, low signal on T1 and high signal on T2.  CT head was unremarkable.  LP was also unremarkable.  He was started on high-dose Solu-Medrol for 5 days with first day starting on 03/10/2020.  The work-up so far has not been indicative of transverse myelitis but this along with possible spinal infarct is so far his working diagnosis per neurology and he is getting Solu-Medrol empirically for that.  He has not noted any improvement in his weakness since started on Solu-Medrol.  He is going to receive his last dose today.Dr. Lorraine Lax discussed case with [facility] neuro immunologist on 03/15/2020 who had declined transfer and opined that there is nothing further that they have to offer and agreed with the treatment that patient is receiving here.  Due to consideration of possible infarct, I discussed with Dr. Lorraine Lax about possibility of arranging loop recorder for this patient.  He agrees with that plan.  We have not seen any atrial fibrillation on telemetry here.  I consulted cardiology on 03/15/2020 who planned to do a event monitor for 30 days in near future.  Patient had repeat MRI of the C and T-spine today ordered by neurology which shows progressive cord edema extending from C4-5 to T4-5 with mild patchiness been.  Progression favors myelitis.  Patient started on IVIG on 03/16/2020.  Patient has not noticed any change in his weakness.  Acute hypoxic respiratory failure/bilateral atelectasis/neuromuscular weakness of unknown origin/aspiration pneumonia: Patient suddenly had acute shortness of breath/respiratory distress at around 5:45 PM on the evening of 03/15/2020.  Rapid response was called.  Patient was moved to  ICU.  PCCM was consulted.  Patient initially required nonrebreather.  Chest x-ray and abdominal x-ray were done.  Chest x-ray shows possibility of infiltrates or atelectasis, worsened.  Patient has remained afebrile without any leukocytosis.  Procalcitonin is stable however he has been started on cefepime by PCCM now.  I agree with that.  Currently he is on 1 to 2 L nasal congestion.  Denies any shortness of breath.  He refused BiPAP last night.  IDDM/type 2 diabetes mellitus He is off of the dextrose since yesterday and now his blood sugar slightly elevated.  We will start him on Lantus 20 units and continue SSI.  Essential HTN -Continue home meds -BP stable and controlled at this time  HLD -Continue with statin as tolerated  Obesity: -Recommend diet/lifestyle modification  Hx malignant melanoma -chart reviewed. Pt s/p biopsy on 10/19, followed by [facility] -Seems to be stable currently  AKI/dehydration: Came in with normal GFR.  Creatinine jumped to 1.74.  AKI has resolved.   Constipation: Had another bowel movement this morning.  Abdomen is still distended but much softer.  Continue Colace and MiraLAX on daily basis.  DVT prophylaxis: Place and maintain sequential compression device Start: 03/09/20 1849 And Lovenox Code Status: Full Family Communication: Pt in room, family  currently not at bedside  Status is: Inpatient  Remains inpatient appropriate because:Ongoing diagnostic testing needed not appropriate for outpatient work up and Inpatient level of care appropriate due to severity of illness  Dispo: The patient is from: Home              Anticipated d/c is to: CIR              Anticipated d/c date is: 03/20/2020              Patient currently is not medically stable to d/c.     Consultants:   Cardiology  Neurology  Procedures:   Stress test 6/21  Antimicrobials: Anti-infectives (From admission, onward)   Start     Dose/Rate Route Frequency Ordered Stop   03/18/20  1000  ceFEPIme (MAXIPIME) 2 g in sodium chloride 0.9 % 100 mL IVPB     Discontinue     2 g 200 mL/hr over 30 Minutes Intravenous Every 8 hours 03/18/20 0849        Subjective: Patient seen and examined.  Denies any shortness of breath.  No improvement in his weakness.  No other complaint. Objective: Vitals:   03/18/20 1059 03/18/20 1100 03/18/20 1114 03/18/20 1200  BP:  94/61  (!) 132/47  Pulse: (!) 59 (!) 57 (!) 55 (!) 54  Resp: 19 18 19 17   Temp:   98.4 F (36.9 C)   TempSrc:   Oral   SpO2: 94% 98% 98% 99%  Weight:      Height:        Intake/Output Summary (Last 24 hours) at 03/18/2020 1250 Last data filed at 03/18/2020 1200 Gross per 24 hour  Intake 920 ml  Output 3750 ml  Net -2830 ml   Filed Weights   03/08/20 2044 03/12/20 0619 03/15/20 1905  Weight: 98.4 kg 102.7 kg 110.3 kg    Examination:  General exam: Appears calm and comfortable, morbidly obese Respiratory system: Rhonchi bilaterally, no wheezes.Marland Kitchen Respiratory effort normal. Cardiovascular system: S1 & S2 heard, RRR. No JVD, murmurs, rubs, gallops or clicks. No pedal edema. Gastrointestinal system: Abdomen slightly distended, soft and nontender. No organomegaly or masses felt. Normal bowel sounds heard. Central nervous system: Alert and oriented.  0/5 power in bilateral lower extremity.  Weak handgrip.  Brisk bilateral lower extremity deep tendon reflexes. Skin: No rashes, lesions or ulcers.  Psychiatry: Judgement and insight appear normal. Mood & affect appropriate.    Data Reviewed: I have personally reviewed following labs and imaging studies  CBC: Recent Labs  Lab 03/13/20 0403 03/13/20 0403 03/14/20 0406 03/14/20 0406 03/15/20 0138 03/15/20 2305 03/16/20 0215 03/17/20 0216 03/18/20 0324  WBC 10.9*   < > 11.3*  --  10.4  --  9.5 7.5 7.4  NEUTROABS 9.2*  --  7.9*  --  7.5  --  7.0 5.5  --   HGB 12.5*   < > 12.6*   < > 13.6 12.9* 13.5 12.1* 11.8*  HCT 38.2*   < > 39.6   < > 42.8 38.0* 41.5  37.6* 37.4*  MCV 89.9   < > 90.8  --  91.5  --  90.0 90.4 90.6  PLT 199   < > 188  --  218  --  210 203 227   < > = values in this interval not displayed.   Basic Metabolic Panel: Recent Labs  Lab 03/14/20 0406 03/14/20 0406 03/15/20 0138 03/15/20 2305 03/16/20 0215 03/17/20 0216 03/18/20 0324  NA  139   < > 137 140 139 136 134*  K 4.8   < > 4.8 4.5 4.3 4.0 4.1  CL 110  --  107  --  106 104 104  CO2 20*  --  21*  --  23 24 22   GLUCOSE 98  --  172*  --  66* 98 175*  BUN 46*  --  36*  --  33* 33* 26*  CREATININE 1.06  --  0.99  --  0.94 0.97 0.87  CALCIUM 8.4*  --  8.5*  --  8.7* 8.3* 8.3*  MG  --   --   --   --  2.2  --   --   PHOS  --   --   --   --  4.0  --   --    < > = values in this interval not displayed.   GFR: Estimated Creatinine Clearance: 94.1 mL/min (by C-G formula based on SCr of 0.87 mg/dL). Liver Function Tests: Recent Labs  Lab 03/18/20 0324  AST 74*  ALT 66*  ALKPHOS 76  BILITOT 0.7  PROT 6.8  ALBUMIN 2.0*   No results for input(s): LIPASE, AMYLASE in the last 168 hours. No results for input(s): AMMONIA in the last 168 hours. Coagulation Profile: No results for input(s): INR, PROTIME in the last 168 hours. Cardiac Enzymes: Recent Labs  Lab 03/15/20 1921  CKTOTAL 119   BNP (last 3 results) No results for input(s): PROBNP in the last 8760 hours. HbA1C: No results for input(s): HGBA1C in the last 72 hours. CBG: Recent Labs  Lab 03/17/20 1128 03/17/20 1527 03/17/20 1920 03/18/20 0725 03/18/20 1113  GLUCAP 111* 198* 250* 157* 223*   Lipid Profile: No results for input(s): CHOL, HDL, LDLCALC, TRIG, CHOLHDL, LDLDIRECT in the last 72 hours. Thyroid Function Tests: Recent Labs    03/15/20 1921  TSH 2.300   Anemia Panel: Recent Labs    03/15/20 1921  VITAMINB12 814   Sepsis Labs: Recent Labs  Lab 03/15/20 1921 03/16/20 0215 03/17/20 0216 03/18/20 0324  PROCALCITON <0.10 0.16 0.33 0.24  LATICACIDVEN 0.9  --   --   --      Recent Results (from the past 240 hour(s))  Gram stain     Status: None   Collection Time: 03/10/20  5:15 PM   Specimen: PATH Cytology CSF; Cerebrospinal Fluid  Result Value Ref Range Status   Specimen Description CSF  Final   Special Requests NONE  Final   Gram Stain   Final    WBC PRESENT, PREDOMINANTLY PMN NO ORGANISMS SEEN CYTOSPIN SMEAR Performed at [facility], [place] [address], [place], [place] [address]    Report Status 03/10/2020 FINAL  Final  MRSA PCR Screening     Status: None   Collection Time: 03/16/20 11:49 AM   Specimen: Nasal Mucosa; Nasopharyngeal  Result Value Ref Range Status   MRSA by PCR NEGATIVE NEGATIVE Final    Comment:        The GeneXpert MRSA Assay (FDA approved for NASAL specimens only), is one component of a comprehensive MRSA colonization surveillance program. It is not intended to diagnose MRSA infection nor to guide or monitor treatment for MRSA infections. Performed at [facility], [place] [address] [place], [place] [address]      Radiology Studies: DG Chest Port 1 View  Result Date: 03/18/2020 CLINICAL DATA:  Cough EXAM: PORTABLE CHEST 1 VIEW COMPARISON:  March 17, 2020 FINDINGS: There  is persistent airspace opacity in the right upper lobe. There is more ill-defined airspace opacity in both mid and lower lung regions, similar to 1 day prior. No new opacity evident. Heart is upper normal in size, stable, with pulmonary vascularity within normal limits. No adenopathy. There is aortic atherosclerosis. There is degenerative change in each shoulder. IMPRESSION: Multifocal airspace opacity bilaterally, most severe in the right upper lobe. Suspect multifocal pneumonia, although a degree of superimposed pulmonary edema may be present. No new opacity evident. Stable cardiac silhouette. Aortic Atherosclerosis (ICD10-I70.0). Electronically Signed   By: Lowella Grip III M.D.   On: 03/18/2020 08:01   DG Chest Port 1 View  Result Date:  03/17/2020 CLINICAL DATA:  Acute on chronic respiratory failure with hypoxia. EXAM: PORTABLE CHEST 1 VIEW COMPARISON:  Chest x-ray 03/16/2020 FINDINGS: Worsening lung aeration with progressive interstitial and airspace process more pronounced in the right lung. No definite pleural effusions. Stable tortuous and calcified thoracic aorta. No cardiac enlargement. IMPRESSION: Worsening lung aeration with progressive interstitial and airspace process more pronounced in the right lung. Electronically Signed   By: Marijo Sanes M.D.   On: 03/17/2020 07:51    Scheduled Meds: . aspirin EC  81 mg Oral QHS  . chlorhexidine  15 mL Mouth Rinse BID  . Chlorhexidine Gluconate Cloth  6 each Topical Q0600  . docusate sodium  100 mg Oral BID  . enoxaparin (LOVENOX) injection  50 mg Subcutaneous Q24H  . fenofibrate  160 mg Oral QHS  . fluticasone  2 spray Each Nare QHS  . icosapent Ethyl  2 g Oral BID  . insulin aspart  0-20 Units Subcutaneous TID WC  . insulin aspart  0-5 Units Subcutaneous QHS  . insulin glargine  20 Units Subcutaneous Daily  . lidocaine  1 patch Transdermal Q24H  . mouth rinse  15 mL Mouth Rinse q12n4p  . multivitamin with minerals  1 tablet Oral Daily  . nystatin  1 application Topical TID  . oxybutynin  5 mg Oral BID  . pantoprazole  40 mg Oral Daily  . polyethylene glycol  17 g Oral Daily  . rosuvastatin  40 mg Oral QHS  . sertraline  150 mg Oral QHS  . vitamin B-12  500 mcg Oral Daily  . zolpidem  5 mg Oral QHS   Continuous Infusions: . sodium chloride Stopped (03/17/20 0902)  . ceFEPime (MAXIPIME) IV 2 g (03/18/20 0922)  . Immune Globulin 10% 0 g (03/16/20 2100)     LOS: 9 days   Darliss Cheney, MD [facility] Pager On Amion  If 7PM-7AM, please contact night-coverage 03/18/2020, 12:50 PM

## 2020-03-18 NOTE — Progress Notes (Signed)
Modified Barium Swallow Progress Note  Patient Details  Name: Brent [name]. MRN: [id number] Date of Birth: 1946-09-04  Today's Date: 03/18/2020  Modified Barium Swallow completed.  Full report located under Chart Review in the Imaging Section.  Brief recommendations include the following:  Clinical Impression  Pt demonstrates two instances of trace premature spillage prior to swallow initiation that resulted in laryngeal penetration with eventual ejection via laryngeal squeeze. Otherwise pharyngeal function WNL. Pt has dry oral mucosa, suspect this may reduce oral control and sensation. Problem is mild and not significantly detrimental to pts safety. Recommend pt continue current diet and focus on oral hygiene and hydration. No SLP f/u needed, will sign off.    Swallow Evaluation Recommendations       SLP Diet Recommendations: Regular solids;Thin liquid   Liquid Administration via: Cup;Straw   Medication Administration: Whole meds with liquid   Supervision: Patient able to self feed       Postural Changes: Seated upright at 90 degrees   Oral Care Recommendations: Oral care QID       Brent [place], MA [place] Pager [phone] Office [phone]  Brent [name], Brent [name] 03/18/2020,1:36 PM

## 2020-03-18 NOTE — Progress Notes (Signed)
Patient performed NIF and VC with good effort.  NIF -40  VC 1.3L

## 2020-03-18 NOTE — Progress Notes (Signed)
Patient refused BIPAP for the evening. Rn made aware, will continue to monitor pt.

## 2020-03-19 DIAGNOSIS — J9621 Acute and chronic respiratory failure with hypoxia: Secondary | ICD-10-CM

## 2020-03-19 LAB — CBC WITH DIFFERENTIAL/PLATELET
Abs Immature Granulocytes: 0.04 10*3/uL (ref 0.00–0.07)
Basophils Absolute: 0 10*3/uL (ref 0.0–0.1)
Basophils Relative: 0 %
Eosinophils Absolute: 0 10*3/uL (ref 0.0–0.5)
Eosinophils Relative: 0 %
HCT: 35.2 % — ABNORMAL LOW (ref 39.0–52.0)
Hemoglobin: 11.4 g/dL — ABNORMAL LOW (ref 13.0–17.0)
Immature Granulocytes: 1 %
Lymphocytes Relative: 24 %
Lymphs Abs: 1.5 10*3/uL (ref 0.7–4.0)
MCH: 29 pg (ref 26.0–34.0)
MCHC: 32.4 g/dL (ref 30.0–36.0)
MCV: 89.6 fL (ref 80.0–100.0)
Monocytes Absolute: 0.4 10*3/uL (ref 0.1–1.0)
Monocytes Relative: 6 %
Neutro Abs: 4.4 10*3/uL (ref 1.7–7.7)
Neutrophils Relative %: 69 %
Platelets: 235 10*3/uL (ref 150–400)
RBC: 3.93 MIL/uL — ABNORMAL LOW (ref 4.22–5.81)
RDW: 13.3 % (ref 11.5–15.5)
WBC: 6.3 10*3/uL (ref 4.0–10.5)
nRBC: 0 % (ref 0.0–0.2)

## 2020-03-19 LAB — BASIC METABOLIC PANEL
Anion gap: 7 (ref 5–15)
BUN: 26 mg/dL — ABNORMAL HIGH (ref 8–23)
CO2: 22 mmol/L (ref 22–32)
Calcium: 8.2 mg/dL — ABNORMAL LOW (ref 8.9–10.3)
Chloride: 104 mmol/L (ref 98–111)
Creatinine, Ser: 1.01 mg/dL (ref 0.61–1.24)
GFR calc Af Amer: >60 mL/min (ref >60)
GFR calc non Af Amer: >60 mL/min (ref >60)
Glucose, Bld: 171 mg/dL — ABNORMAL HIGH (ref 70–99)
Potassium: 4.1 mmol/L (ref 3.5–5.1)
Sodium: 133 mmol/L — ABNORMAL LOW (ref 135–145)

## 2020-03-19 LAB — GLUCOSE, CAPILLARY
Glucose-Capillary: 142 mg/dL — ABNORMAL HIGH (ref 70–99)
Glucose-Capillary: 184 mg/dL — ABNORMAL HIGH (ref 70–99)
Glucose-Capillary: 209 mg/dL — ABNORMAL HIGH (ref 70–99)
Glucose-Capillary: 181 mg/dL — ABNORMAL HIGH (ref 70–99)

## 2020-03-19 LAB — LYME DISEASE DNA BY PCR(BORRELIA BURG): Lyme Disease(B.burgdorferi)PCR: NEGATIVE

## 2020-03-19 MED ORDER — INSULIN GLARGINE 100 UNIT/ML ~~LOC~~ SOLN
30.0000 [IU] | Freq: Every day | SUBCUTANEOUS | Status: DC
  Administered 2020-03-19 – 2020-03-25 (×7): 30 [IU] via SUBCUTANEOUS
  Filled 2020-03-19 (×7): qty 0.3

## 2020-03-19 MED ORDER — BISACODYL 10 MG RE SUPP
10.0000 mg | Freq: Once | RECTAL | Status: AC
  Administered 2020-03-19: 10 mg via RECTAL
  Filled 2020-03-19: qty 1

## 2020-03-19 NOTE — Progress Notes (Signed)
Messaged Dr. Doristine Bosworth re: CCM MD ok for pt to tx out if [facility] ok with it.

## 2020-03-19 NOTE — Progress Notes (Signed)
VAST consulted to start IVIG. Spoke with patient about his previous doses to ascertain tolerance. Pt and his wife reported he had a headache and chills the first night; "the nurse slowed the infusion and he tolerated the remainder of the infusion well".  Marya Amsler, RN for night shift walked in as VAST RN was hanging IVIG. He informed VAST RN that the last 2 nights he has given IVIG to patient and stopped increasing infusion at a max rate of 200mg /kg/hr for remainder of infusion and patient tolerated well. Marya Amsler, RN verbalized that he consulted pharmacy to make adjustment to infusion rate.

## 2020-03-19 NOTE — Progress Notes (Signed)
PROGRESS NOTE    Brent [name].  NGE:[id number] DOB: Nov 19, 1945 DOA: 03/08/2020 PCP: [name], [name], MD    Brief Narrative:  74 y.o. male with medical history significant of remote MI stenting x3 in 1990s, CAD, IDDM, DM neuropathy, sciatica with injection treatment 2 years ago, HTN, HLD, came with new onset of chest pain which started when patient was in the church and standing.  Pressure-like 8-9 over 10, associated with feeling of nausea and sweating no vomiting no short of breath no palpitations.  EMS arrived and gave patient some aspirin nitroglycerin sublingual with little improvement.  Then upon arrival in the ED the chest pain slowly progressed to radiate to the left shoulder and his back, then slowly subsided.  While staying in the ED patient started to feel weakness of the whole right-sided leg, he remember that while lying on the stretcher in the ambulance, the stretcher had a bump right beneath his right hip area, where he had " bad inflamed nerve which received injection about 2 years ago in [place]".   He denieD any numbness or loss of feeling but only weakness from hip to toe on the right side.  He denied baseline claudication, he does have chronic back pain on and off.  Patient also admitted he has a baseline diabetic neuropathy with frequent on and off tingling sensation of toes and fingertips. Upon arrival to ED, troponin II sets negative, CTA dissection study negative.  Patient was admitted to hospital service.  Cardiology was consulted.  He underwent nuclear stress test which was unremarkable.  He then started having weakness in left lower extremity as well as decreased grip strength in bilateral hands.  Neurology was consulted. C Spine MRI was ordered which was without any remarkable acute findings per official radiology report however per neurology MRI spine shows subtle signal change on cervical, thoracic spine and he had discussed with neuroradiology as well.  T-spine MRI  showed 6 x 10 mm enhancing lesion left T9 vertebral body, low signal on T1 and high signal on T2.  CT head was unremarkable.  LP was also unremarkable.  His initial working diagnosis was transverse myelitis however the work-up has not been indicative of that.  Patient was started on high-dose Solu-Medrol 1000 mg IV daily for 5 days starting 03/10/2020.  Patient was stable but was not progressing as far as the weakness goes.  He completed his high-dose Solu-Medrol on 03/14/2020.  Patient suddenly had acute shortness of breath/respiratory distress at around 5:45 PM on the evening of 03/15/2020.  Rapid response was called.  Patient was moved to ICU.  PCCM was consulted.  Patient initially required nonrebreather.  Chest x-ray and abdominal x-ray were done.  Chest x-ray shows possibility of infiltrates or atelectasis.  MRI C-spine and T-spine was repeated by neurology on 03/08/2020 which showed progressive cord edema extending from C4-5 to T4-5 with mild patchiness been.  Progression favors myelitis.  Patient started on IVIG on 03/16/2020.  Assessment & Plan:   Active Problems:   Chest pain   Weakness   Quadriplegia (HCC)   Coronary artery disease involving native coronary artery of native heart without angina pectoris   Steroid-induced hyperglycemia   Diabetic peripheral neuropathy (HCC)   Tachypnea   Hyponatremia   AKI (acute kidney injury) ([place])   Acute hypoxemic respiratory failure (HCC)  Chest pain -Pt has significant prior cardiac history -Given risk factors, Cardiology consulted and pt underwent nuclear stress test on 6/21, reviewed, with no reversible  ischemia or infarct. LVEF of 53%, normal LV wall motion. Low risk study -Continue his cardiac meds as tolerated  Progressive BLE weakness, numbness, loss of B hand grip strength  -new findings since initial presentation -Symptoms initially with RLE, since progressing to involve BLE weakness and B grip strength.  -Appreciate assistance by  Neurology.  C Spine MRI was ordered which was without any remarkable acute findings per official radiology report however per neurology MRI spine shows subtle signal change on cervical, thoracic spine and he had discussed with neuroradiology as well.  T-spine MRI showed 6 x 10 mm enhancing lesion left T9 vertebral body, low signal on T1 and high signal on T2.  CT head was unremarkable.  LP was also unremarkable.  He was started on high-dose Solu-Medrol for 5 days with first day starting on 03/10/2020.  The work-up so far has not been indicative of transverse myelitis but this along with possible spinal infarct is so far his working diagnosis per neurology and he is getting Solu-Medrol empirically for that.  He has not noted any improvement in his weakness since started on Solu-Medrol.  He is going to receive his last dose today.Dr. Lorraine Lax discussed case with [facility] neuro immunologist on 03/15/2020 who had declined transfer and opined that there is nothing further that they have to offer and agreed with the treatment that patient is receiving here.  Due to consideration of possible infarct, I discussed with Dr. Lorraine Lax about possibility of arranging loop recorder for this patient.  He agrees with that plan.  We have not seen any atrial fibrillation on telemetry here.  I consulted cardiology on 03/15/2020 who planned to do a event monitor for 30 days in near future.  Patient had repeat MRI of the C and T-spine today ordered by neurology which shows progressive cord edema extending from C4-5 to T4-5 with mild patchiness been.  Progression favors myelitis.  Patient started on IVIG on 03/16/2020 for 5 days. neuromyelitis optica Ab positive from 6/24. B burgdorfi Ab negative . Lyme DNA still in process.  Patient tells me that he feels better today.  He also noted some movement in left lower extremity today.  Acute hypoxic respiratory failure/bilateral atelectasis/neuromuscular weakness of unknown origin/aspiration pneumonia:  Patient suddenly had acute shortness of breath/respiratory distress at around 5:45 PM on the evening of 03/15/2020.  Rapid response was called.  Patient was moved to ICU.  PCCM was consulted.  Patient initially required nonrebreather.  Chest x-ray and abdominal x-ray were done.  Chest x-ray shows possibility of infiltrates or atelectasis, worsened.  Patient has remained afebrile without any leukocytosis.  Procalcitonin is stable.  Started on cefepime on 03/18/2020.  Continue that.  He is now comfortable on room air.  IDDM/type 2 diabetes mellitus Blood sugar slightly elevated.  Increase Lantus to 30 units and continue SSI.  Essential HTN -Continue home meds -BP stable and controlled at this time  HLD -Continue with statin as tolerated  Obesity: -Recommend diet/lifestyle modification  Hx malignant melanoma -chart reviewed. Pt s/p biopsy on 10/19, followed by [facility] -Seems to be stable currently  AKI/dehydration: Came in with normal GFR.  Creatinine jumped to 1.74.  AKI has resolved.   Constipation: Had another bowel movement this morning.  Abdomen is still distended but much softer.  Continue Colace and MiraLAX on daily basis.  DVT prophylaxis: Place and maintain sequential compression device Start: 03/09/20 1849 And Lovenox Code Status: Full Family Communication: Pt in room, family currently not at bedside  Status  is: Inpatient  Remains inpatient appropriate because:Ongoing diagnostic testing needed not appropriate for outpatient work up and Inpatient level of care appropriate due to severity of illness  Dispo: The patient is from: Home              Anticipated d/c is to: CIR              Anticipated d/c date is: 03/20/2020              Patient currently is not medically stable to d/c.     Consultants:   Cardiology  Neurology  Procedures:   Stress test 6/21  Antimicrobials: Anti-infectives (From admission, onward)   Start     Dose/Rate Route Frequency Ordered Stop    03/18/20 1000  ceFEPIme (MAXIPIME) 2 g in sodium chloride 0.9 % 100 mL IVPB     Discontinue     2 g 200 mL/hr over 30 Minutes Intravenous Every 8 hours 03/18/20 0849        Subjective: Seen and examined.  Feels much better.  Feels a little stronger.  No shortness of breath.  No other complaint.  Tells me that he felt slight movement in left foot.  But he is unable to initiate any movement in lower extremities during my encounter.  Objective: Vitals:   03/19/20 0600 03/19/20 0710 03/19/20 0940 03/19/20 1108  BP: (!) 135/53  (!) 111/33   Pulse: (!) 55  (!) 57   Resp: (!) 26  15   Temp:  98.7 F (37.1 C)  99.1 F (37.3 C)  TempSrc:  Oral  Oral  SpO2: 95%  96%   Weight:      Height:        Intake/Output Summary (Last 24 hours) at 03/19/2020 1254 Last data filed at 03/19/2020 1200 Gross per 24 hour  Intake 1454 ml  Output 2900 ml  Net -1446 ml   Filed Weights   03/08/20 2044 03/12/20 0619 03/15/20 1905  Weight: 98.4 kg 102.7 kg 110.3 kg    Examination:  General exam: Appears calm and comfortable, morbidly obese Respiratory system: Clear to auscultation. Respiratory effort normal. Cardiovascular system: S1 & S2 heard, RRR. No JVD, murmurs, rubs, gallops or clicks. No pedal edema. Gastrointestinal system: Abdomen is nondistended, soft and nontender. No organomegaly or masses felt. Normal bowel sounds heard. Central nervous system: Alert and oriented.  0/5 power in bilateral lower extremities with brisk deep tendon reflexes.  Poor strength in hands affecting grip. Skin: No rashes, lesions or ulcers.  Psychiatry: Judgement and insight appear normal. Mood & affect appropriate.    Data Reviewed: I have personally reviewed following labs and imaging studies  CBC: Recent Labs  Lab 03/14/20 0406 03/14/20 0406 03/15/20 0138 03/15/20 0138 03/15/20 2305 03/16/20 0215 03/17/20 0216 03/18/20 0324 03/19/20 0340  WBC 11.3*   < > 10.4  --   --  9.5 7.5 7.4 6.3  NEUTROABS 7.9*  --   7.5  --   --  7.0 5.5  --  4.4  HGB 12.6*   < > 13.6   < > 12.9* 13.5 12.1* 11.8* 11.4*  HCT 39.6   < > 42.8   < > 38.0* 41.5 37.6* 37.4* 35.2*  MCV 90.8   < > 91.5  --   --  90.0 90.4 90.6 89.6  PLT 188   < > 218  --   --  210 203 227 235   < > = values in this interval not displayed.  Basic Metabolic Panel: Recent Labs  Lab 03/15/20 0138 03/15/20 0138 03/15/20 2305 03/16/20 0215 03/17/20 0216 03/18/20 0324 03/19/20 0340  NA 137   < > 140 139 136 134* 133*  K 4.8   < > 4.5 4.3 4.0 4.1 4.1  CL 107  --   --  106 104 104 104  CO2 21*  --   --  23 24 22 22   GLUCOSE 172*  --   --  66* 98 175* 171*  BUN 36*  --   --  33* 33* 26* 26*  CREATININE 0.99  --   --  0.94 0.97 0.87 1.01  CALCIUM 8.5*  --   --  8.7* 8.3* 8.3* 8.2*  MG  --   --   --  2.2  --   --   --   PHOS  --   --   --  4.0  --   --   --    < > = values in this interval not displayed.   GFR: Estimated Creatinine Clearance: 81.1 mL/min (by C-G formula based on SCr of 1.01 mg/dL). Liver Function Tests: Recent Labs  Lab 03/18/20 0324  AST 74*  ALT 66*  ALKPHOS 76  BILITOT 0.7  PROT 6.8  ALBUMIN 2.0*   No results for input(s): LIPASE, AMYLASE in the last 168 hours. No results for input(s): AMMONIA in the last 168 hours. Coagulation Profile: No results for input(s): INR, PROTIME in the last 168 hours. Cardiac Enzymes: Recent Labs  Lab 03/15/20 1921  CKTOTAL 119   BNP (last 3 results) No results for input(s): PROBNP in the last 8760 hours. HbA1C: No results for input(s): HGBA1C in the last 72 hours. CBG: Recent Labs  Lab 03/18/20 1113 03/18/20 1605 03/18/20 2119 03/19/20 0708 03/19/20 1106  GLUCAP 223* 183* 254* 142* 184*   Lipid Profile: No results for input(s): CHOL, HDL, LDLCALC, TRIG, CHOLHDL, LDLDIRECT in the last 72 hours. Thyroid Function Tests: No results for input(s): TSH, T4TOTAL, FREET4, T3FREE, THYROIDAB in the last 72 hours. Anemia Panel: No results for input(s): VITAMINB12, FOLATE,  FERRITIN, TIBC, IRON, RETICCTPCT in the last 72 hours. Sepsis Labs: Recent Labs  Lab 03/15/20 1921 03/16/20 0215 03/17/20 0216 03/18/20 0324  PROCALCITON <0.10 0.16 0.33 0.24  LATICACIDVEN 0.9  --   --   --     Recent Results (from the past 240 hour(s))  Gram stain     Status: None   Collection Time: 03/10/20  5:15 PM   Specimen: PATH Cytology CSF; Cerebrospinal Fluid  Result Value Ref Range Status   Specimen Description CSF  Final   Special Requests NONE  Final   Gram Stain   Final    WBC PRESENT, PREDOMINANTLY PMN NO ORGANISMS SEEN CYTOSPIN SMEAR Performed at [facility], [place] [address], [place], [place] [address]    Report Status 03/10/2020 FINAL  Final  MRSA PCR Screening     Status: None   Collection Time: 03/16/20 11:49 AM   Specimen: Nasal Mucosa; Nasopharyngeal  Result Value Ref Range Status   MRSA by PCR NEGATIVE NEGATIVE Final    Comment:        The GeneXpert MRSA Assay (FDA approved for NASAL specimens only), is one component of a comprehensive MRSA colonization surveillance program. It is not intended to diagnose MRSA infection nor to guide or monitor treatment for MRSA infections. Performed at [facility], [place] [address], [place], [place] [address]  Radiology Studies: DG Chest Port 1 View  Result Date: 03/18/2020 CLINICAL DATA:  Cough EXAM: PORTABLE CHEST 1 VIEW COMPARISON:  March 17, 2020 FINDINGS: There is persistent airspace opacity in the right upper lobe. There is more ill-defined airspace opacity in both mid and lower lung regions, similar to 1 day prior. No new opacity evident. Heart is upper normal in size, stable, with pulmonary vascularity within normal limits. No adenopathy. There is aortic atherosclerosis. There is degenerative change in each shoulder. IMPRESSION: Multifocal airspace opacity bilaterally, most severe in the right upper lobe. Suspect multifocal pneumonia, although a degree of superimposed pulmonary edema may be  present. No new opacity evident. Stable cardiac silhouette. Aortic Atherosclerosis (ICD10-I70.0). Electronically Signed   By: Lowella Grip III M.D.   On: 03/18/2020 08:01   DG Swallowing Func-Speech Pathology  Result Date: 03/18/2020 Objective Swallowing Evaluation: Type of Study: MBS-Modified Barium Swallow Study  Patient Details Name: Brent [name]. MRN: [id number] Date of Birth: April 06, 1946 Today's Date: 03/18/2020 Time: SLP Start Time (ACUTE ONLY): 1310 -SLP Stop Time (ACUTE ONLY): 1325 SLP Time Calculation (min) (ACUTE ONLY): 15 min Past Medical History: Past Medical History: Diagnosis Date . Allergy  . CAD (coronary artery disease)   a. angioplasty of his RCA in 1990. b. bare metal stent placed in the RCA in 2000 followed by rotational atherectomy shortly after for stent restenosis. c. last cath was in 2012 showing stable moderate diffuse CAD. (70% mid LAD, 80% diagonal, 70% Ramus, 40% mid to distal RCA stent restenosis). d. Low risk nuc in 2015. . Diabetes mellitus  . Diverticulosis  . Elevated CK  . Erectile dysfunction  . Hemorrhoids  . HTN (hypertension)  . Hyperlipidemia  . Hypertriglyceridemia  . Malignant melanoma of left side of neck ([place]) 10/25/2018 . Myocardial infarction ([place])  . Obesity  . OSA (obstructive sleep apnea)  . Persistent disorder of initiating or maintaining sleep  . Personal history of colonic polyps 02/05/2003 Past Surgical History: Past Surgical History: Procedure Laterality Date . CHOLECYSTECTOMY   . CORONARY ANGIOPLASTY   . CORONARY STENT PLACEMENT    stenting of the right coronary artery with followup rotational  atherectomy. (3 stents placed) . FINGER SURGERY    right . FOOT SURGERY    right . INGUINAL HERNIA REPAIR    right . melanoma removal    neck . ORTHOPEDIC SURGERY    foot right . rotator cuff surg    Bil HPI: 74 year old man with PMH listed below including melanoma presenting with chest pain with neg stress test.  He then developed scattered neurological deficits  prompting extensive workup to date.   Pt was evalauted by SLP on 6/23, found ot have weak cough mechanism, but no signs of aspiration, tolerating diet. Thought to be stable in terms of neuro exam until 6/27 when he became more SOB, reported increased LE weakness. Critical care questioned secretion clearance issues and volume overload as well as constipation reducing FRC.   Chest x-ray shows possibility of infiltrates or atelectasis.  MRI performed (6/28) revealed progressive cord edema extending from C4-5 to T4-5 with mild patchy enhancement. Transverse myelitis and cord infarct are the primary considerations, with progression favoring myelitis, per Radiology. However, the abrupt onset on 6/20 is more suggestive of a spinal cord infarction. Initiated empiric IVIG. On a liquid diet.  No data recorded Assessment / Plan / Recommendation CHL IP CLINICAL IMPRESSIONS 03/18/2020 Clinical Impression Pt demosntrates two instances of trace premature spillage prior to swallow initiation  that resulted in laryngeal penetration with eventual ejection via laryngeal squeeze. Otherwise pharyngeal function WNL. Pt has dry oral mucosa, suspect this may reduce oral control and sensation. Problem is mild and not significantly detrimental to pts safety. Recommend pt continue current diet and focus on oral hygiene and hydration. No SLP f/u needed, will sign off.  SLP Visit Diagnosis Dysphagia, oral phase (R13.11) Attention and concentration deficit following -- Frontal lobe and executive function deficit following -- Impact on safety and function Mild aspiration risk   CHL IP TREATMENT RECOMMENDATION 03/18/2020 Treatment Recommendations No treatment recommended at this time   Prognosis 03/17/2020 Prognosis for Safe Diet Advancement Good Barriers to Reach Goals -- Barriers/Prognosis Comment -- CHL IP DIET RECOMMENDATION 03/18/2020 SLP Diet Recommendations Regular solids;Thin liquid Liquid Administration via Cup;Straw Medication Administration  Whole meds with liquid Compensations -- Postural Changes Seated upright at 90 degrees   CHL IP OTHER RECOMMENDATIONS 03/18/2020 Recommended Consults -- Oral Care Recommendations Oral care QID Other Recommendations --   CHL IP FOLLOW UP RECOMMENDATIONS 03/18/2020 Follow up Recommendations None   CHL IP FREQUENCY AND DURATION 03/17/2020 Speech Therapy Frequency (ACUTE ONLY) min 2x/week Treatment Duration 2 weeks      CHL IP ORAL PHASE 03/18/2020 Oral Phase Impaired Oral - Pudding Teaspoon -- Oral - Pudding Cup -- Oral - Honey Teaspoon -- Oral - Honey Cup -- Oral - Nectar Teaspoon -- Oral - Nectar Cup -- Oral - Nectar Straw -- Oral - Thin Teaspoon -- Oral - Thin Cup NT Oral - Thin Straw Premature spillage Oral - Puree WFL Oral - Mech Soft -- Oral - Regular WFL Oral - Multi-Consistency -- Oral - Pill -- Oral Phase - Comment --  No flowsheet data found. No flowsheet data found. Herbie Baltimore, MA CCC-SLP Acute Rehabilitation Services Pager [phone] Office [phone] Lynann Beaver 03/18/2020, 1:37 PM               Scheduled Meds: . aspirin EC  81 mg Oral QHS  . chlorhexidine  15 mL Mouth Rinse BID  . Chlorhexidine Gluconate Cloth  6 each Topical Q0600  . docusate sodium  100 mg Oral BID  . enoxaparin (LOVENOX) injection  50 mg Subcutaneous Q24H  . fenofibrate  160 mg Oral QHS  . fluticasone  2 spray Each Nare QHS  . icosapent Ethyl  2 g Oral BID  . insulin aspart  0-20 Units Subcutaneous TID WC  . insulin aspart  0-5 Units Subcutaneous QHS  . insulin glargine  30 Units Subcutaneous Daily  . lidocaine  1 patch Transdermal Q24H  . mouth rinse  15 mL Mouth Rinse q12n4p  . multivitamin with minerals  1 tablet Oral Daily  . nystatin  1 application Topical TID  . oxybutynin  5 mg Oral BID  . pantoprazole  40 mg Oral Daily  . polyethylene glycol  17 g Oral Daily  . rosuvastatin  40 mg Oral QHS  . sertraline  150 mg Oral QHS  . vitamin B-12  500 mcg Oral Daily  . zolpidem  5 mg Oral QHS    Continuous Infusions: . sodium chloride Stopped (03/17/20 0902)  . ceFEPime (MAXIPIME) IV 2 g (03/19/20 1054)  . Immune Globulin 10% 0 g (03/16/20 2100)     LOS: 10 days   Darliss Cheney, MD [facility] Pager On Amion  If 7PM-7AM, please contact night-coverage 03/19/2020, 12:54 PM

## 2020-03-19 NOTE — Progress Notes (Signed)
Physical Therapy Treatment Patient Details Name: Brent [name]. MRN: [id number] DOB: [birth year] Today's Date: 03/19/2020    History of Present Illness Pt adm 6/20 with chest pain and developed RLE weakness in ED. Pt for nuclear stress test on 6/21 where his weakness progressed to BLE's and Bil hands. Neuro consulted and work up in progress. PMH - CAD, DM, peripheral neuropathy, HTN, MI, obesity    PT Comments    In good spirits in bed.  Emphasis on transitions to EOB and work on balance at EOB before completing squat pivot transfer with 2 persons in to a recliner onto a lift pad.    Follow Up Recommendations  CIR     Equipment Recommendations  Other (comment);Wheelchair (measurements PT);Wheelchair cushion (measurements PT) (TBA)    Recommendations for Other Services       Precautions / Restrictions Precautions Precautions: Fall    Mobility  Bed Mobility Overal bed mobility: Needs Assistance Bed Mobility: Rolling;Sidelying to Sit Rolling: Mod assist Sidelying to sit: +2 for physical assistance;Max assist       General bed mobility comments: up via R elbow and truncal assist.  Hook assisted rolling.  Transfers Overall transfer level: Needs assistance   Transfers: Squat Pivot Transfers     Squat pivot transfers: Max assist;+2 physical assistance     General transfer comment: pt assisted with UE's during a 2 person face to face squat pivot transfer.  Ambulation/Gait                 Stairs             Wheelchair Mobility    Modified Rankin (Stroke Patients Only) Modified Rankin (Stroke Patients Only) Pre-Morbid Rankin Score: No symptoms Modified Rankin: Severe disability (if he has a cord infarct)     Balance Overall balance assessment: Needs assistance Sitting-balance support: Single extremity supported;Bilateral upper extremity supported Sitting balance-Leahy Scale: Poor Sitting balance - Comments: worked on sitting propped back on  elbows and on truncal /cervical extension, reaching all over 10 min sitting EOB,                                    Cognition Arousal/Alertness: Awake/alert Behavior During Therapy: WFL for tasks assessed/performed Overall Cognitive Status: Within Functional Limits for tasks assessed                                        Exercises      General Comments        Pertinent Vitals/Pain Pain Assessment: Faces Faces Pain Scale: No hurt Pain Intervention(s): Monitored during session    Home Living                      Prior Function            PT Goals (current goals can now be found in the care plan section) Acute Rehab PT Goals Patient Stated Goal: return to independence PT Goal Formulation: With patient Time For Goal Achievement: 03/24/20 Potential to Achieve Goals: Good Progress towards PT goals: Progressing toward goals    Frequency    Min 3X/week      PT Plan Current plan remains appropriate    Co-evaluation PT/OT/SLP Co-Evaluation/Treatment: Yes Reason for Co-Treatment: Complexity of the patient's impairments (multi-system involvement);For patient/therapist safety PT goals  addressed during session: Mobility/safety with mobility;Strengthening/ROM OT goals addressed during session: Strengthening/ROM;ADL's and self-care      AM-PAC PT "6 Clicks" Mobility   Outcome Measure  Help needed turning from your back to your side while in a flat bed without using bedrails?: A Lot Help needed moving from lying on your back to sitting on the side of a flat bed without using bedrails?: Total Help needed moving to and from a bed to a chair (including a wheelchair)?: Total Help needed standing up from a chair using your arms (e.g., wheelchair or bedside chair)?: Total Help needed to walk in hospital room?: Total Help needed climbing 3-5 steps with a railing? : Total 6 Click Score: 7    End of Session   Activity Tolerance:  Patient tolerated treatment well Patient left: in chair;with call bell/phone within reach;with chair alarm set;Other (comment) Nurse Communication: Mobility status;Need for lift equipment PT Visit Diagnosis: Other abnormalities of gait and mobility (R26.89);Other symptoms and signs involving the nervous system (R29.898)     Time: 6468-0321 PT Time Calculation (min) (ACUTE ONLY): 40 min  Charges:  $Therapeutic Activity: 8-22 mins $Neuromuscular Re-education: 8-22 mins                     03/19/2020  Ginger Carne., PT Acute Rehabilitation Services [phone]  (pager) [phone]  (office)   Tessie Fass [place] 03/19/2020, 5:20 PM

## 2020-03-19 NOTE — Progress Notes (Signed)
Occupational Therapy Treatment Patient Details Name: Brent [name]. MRN: [id number] DOB: April 18, 1946 Today's Date: 03/19/2020    History of present illness Pt adm 6/20 with chest pain and developed RLE weakness in ED. Pt for nuclear stress test on 6/21 where his weakness progressed to BLE's and Bil hands. Neuro consulted and work up in progress. PMH - CAD, DM, peripheral neuropathy, HTN, MI, obesity   OT comments  Pt continuing to progress well toward stated goals. Session focused on EOB trunk activation and neuro re-ed techniques to begin to advance pt toward OOB BADL. Pt completed rolling with mod A using hook technique and bed mobility with max A +2. Once EOB pt completed various truncal exercises to emphasize sitting balance and posture for BADLs. Pt completed sitting EOB for entirety of exercises for ~10 minutes. Was able to sit entirely unsupported 3-5 seconds at a time. Educated pt on propping with bil UEs with shoulders in extension and palms flat (pt trying to use fisted hand). He was able to tolerate a max A +2 squat pivot transfer this date. No extensor tone noted in BLEs. Continue to recommend CIR at d/c. Will continue to follow.   Follow Up Recommendations  CIR    Equipment Recommendations  Wheelchair (measurements OT);Wheelchair cushion (measurements OT)    Recommendations for Other Services      Precautions / Restrictions Precautions Precautions: Fall Precaution Comments: sensation around T6-9 Restrictions Weight Bearing Restrictions: No       Mobility Bed Mobility Overal bed mobility: Needs Assistance Bed Mobility: Rolling;Sidelying to Sit Rolling: Mod assist Sidelying to sit: Max assist;+2 for physical assistance       General bed mobility comments: pt able to initiate partial EOB sitting with use of R elbow to push while therapist supported legs and hip pivot with bed pad; mod A to roll using hook technique  Transfers Overall transfer level: Needs  assistance   Transfers: Squat Pivot Transfers     Squat pivot transfers: Max assist;+2 physical assistance     General transfer comment: pt assisted with UE's during a 2 person face to face squat pivot transfer.    Balance Overall balance assessment: Needs assistance Sitting-balance support: Single extremity supported;Bilateral upper extremity supported Sitting balance-Leahy Scale: Poor Sitting balance - Comments: worked on sitting propped back on bil UEs and on truncal /cervical extension, reaching all over 10 min sitting EOB,                                   ADL either performed or assessed with clinical judgement   ADL   Eating/Feeding: Set up;Bed level;With adaptive utensils Eating/Feeding Details (indicate cue type and reason): using adaptive feeding devices                                   General ADL Comments: session focused on EOB truncal activation and neuro re-ed techniques to promote eventual increased engagement in [place] [facility]/History: Wears glasses Wears Glasses: At all times Patient Visual Report: No change from baseline     Perception     Praxis      Cognition Arousal/Alertness: Awake/alert Behavior During Therapy: WFL for tasks assessed/performed Overall Cognitive Status: Within Functional Limits for tasks assessed  Exercises Other Exercises Other Exercises: While sitting EOB pt worked on "place and hold" posture techniques for 3-5 seconds without support, trunk flexion, lateral trunk flexion Other Exercises: Bil UE reaching with min-mod trunk support sitting EOB Other Exercises: completed propping with flat hand (pt with tendency to try and use fisted hand)   Shoulder Instructions       General Comments      Pertinent Vitals/ Pain       Pain Assessment: No/denies pain Faces Pain Scale: No hurt Pain Intervention(s): Monitored  during session  Home Living                                          Prior Functioning/Environment              Frequency  Min 2X/week        Progress Toward Goals  OT Goals(current goals can now be found in the care plan section)  Progress towards OT goals: Progressing toward goals  Acute Rehab OT Goals Patient Stated Goal: return to independence OT Goal Formulation: With patient Time For Goal Achievement: 03/25/20 Potential to Achieve Goals: Good  Plan Discharge plan remains appropriate    Co-evaluation    PT/OT/SLP Co-Evaluation/Treatment: Yes Reason for Co-Treatment: Complexity of the patient's impairments (multi-system involvement);For patient/therapist safety;To address functional/ADL transfers PT goals addressed during session: Mobility/safety with mobility;Strengthening/ROM OT goals addressed during session: ADL's and self-care;Strengthening/ROM      AM-PAC OT "6 Clicks" Daily Activity     Outcome Measure   Help from another person eating meals?: A Little Help from another person taking care of personal grooming?: A Little Help from another person toileting, which includes using toliet, bedpan, or urinal?: Total Help from another person bathing (including washing, rinsing, drying)?: Total Help from another person to put on and taking off regular upper body clothing?: A Lot Help from another person to put on and taking off regular lower body clothing?: Total 6 Click Score: 11    End of Session    OT Visit Diagnosis: Other symptoms and signs involving the nervous system (R29.898);Muscle weakness (generalized) (M62.81)   Activity Tolerance Patient tolerated treatment well   Patient Left in chair;with call bell/phone within reach   Nurse Communication Mobility status        Time: 7034-0352 OT Time Calculation (min): 40 min  Charges: OT General Charges $OT Visit: 1 Visit OT Treatments $Neuromuscular Re-education: 8-22  mins  Zenovia Jarred, MSOT, OTR/L [facility] Office Number: [phone] Pager: [phone]  Zenovia Jarred 03/19/2020, 5:44 PM

## 2020-03-19 NOTE — Progress Notes (Signed)
NAME:  Brent Vera., MRN:  [id number], DOB:  04/29/1946, LOS: 79 ADMISSION DATE:  03/08/2020, CONSULTATION DATE:  03/15/20 REFERRING MD:  Doristine Bosworth, CHIEF COMPLAINT:  SOB   Brief History   74 year old man w/ hx of metabolic syndrome who presented with chest pain followed by increasing weakness and SOB.  Worsened respiratory status prompted CCM consult.  History of present illness   74 year old man with PMH listed below including melanoma presenting with chest pain with negative stress test.  He then developed scattered neurological deficits prompting extensive workup to date (LP, MRI of entire spine, brain).  Differential is presently spinal cord infarct, transverse myelitis, paraneoplastic disease.  Thought to be stable in terms of neuro exam until this evening when he became more SOB.  Sounds like he has not been having many BMs.     Past Medical History  IDDM, CAD, HTN, HLD, OSA, Prior melanoma of L neck  Significant Hospital Events   6/21 admitted 6/22 start high dose solumedrol 6/27 complete high dose solumedrol 6/28 start IVIG  Consults:  Neurology, Cardiology  Procedures:   Echocardiogram, bubble study 03/12/20  Significant Diagnostic Tests:   CT angio chest/abd/pelvis 6/20 >> atherosclerosis, mild ATX, mosaic attenuation in mid/lower lung fields, Lt renal cyst, diverticulosis  MRI T spine 6/22 >> 6 x 10 mm enhancing lesion Lt T9 vertebral body  LP 6/22 >> glucose 77, protein 53, RBC 13, WBC 1  MRI brain 6/23 >> mild atrophy  Echo 6/24 >> EF 60 to 65%, RVSP 32 mmHg, negative bubble study  MRI C/T spine 6/28 >> progressive cord edema C5 to T4-5  Micro Data:  COVID 6/20 >> negative CSF 6/22 >> negative  Antimicrobials:  Cefepime 6/30 >>   Interim history/subjective:  Declined Bipap last night.  On supplemental oxygen.  Has some cough.  Denies chest congestion.  Objective   Blood pressure (!) 111/33, pulse (!) 57, temperature 99.1 F (37.3 C), temperature  source Oral, resp. rate 15, height 6' (1.829 m), weight 110.3 kg, SpO2 96 %.        Intake/Output Summary (Last 24 hours) at 03/19/2020 1141 Last data filed at 03/19/2020 1000 Gross per 24 hour  Intake 1292 ml  Output 2450 ml  Net -1158 ml   Filed Weights   03/08/20 2044 03/12/20 0619 03/15/20 1905  Weight: 98.4 kg 102.7 kg 110.3 kg    Examination:  General - alert,elderly male sitting up in bed in NAD  Eyes - no scleral icterus, EOMI bilaterally  ENT - MMM  Cardiac - bradycardiac heart sounds, no friction rub  Chest - decreased expiratory lung sounds, do not appreciate wheezing  Abdomen - mildly distended, non tender to palpation  Extremities - trace LE edema bilaterally  Skin - some dry skin with peeling, no rashes appreciated  Neuro - alert, oriented, unable to demonstrate adequate grip strength, unable to move bilateral feet voluntarily, sensation intact in upper and lower extremities     Psych - calm and cooperative with exam, pleasantly conversational   Resolved Hospital Problem list     Assessment & Plan:   B/L progressive upper and lower extremity weakness  T6-T9 sensory level with C4 to T5 enhancement on MRI concerning for transverse myelitis versus cord infarction. - completed high dose solumedrol 6/27 - started IVIG 6/28 >> to end on 7/2 - neuromyelitis optica Ab positive from 6/24 - B burgdorfi Ab negative  - Lyme DNA still in process   Acute  hypoxic respiratory failure in setting of neuromuscular weakness. Hx of OSA - goal SpO2 > 92% - continue to monitor NIF, VC - qHS Bipap - repeat CXR 7/2  HCAP. Afebrile overnight, Tmax 100.1. CXR without improvement. Patient without SOB or dyspnea.  - continue cefepime 6/30 to end 03/22/20 - f/u CXR 7/2  Hx of HTN, HLD, CAD. - continue ASA, fenofibrate, crestor, vascepa  DM type II with steroid induced hyperglycemia. CBG in last 24 hours ranged from 157-254. Received 20 units of glargine and 18 units SSI in last  24 hours.  - SSI - continue to titrate lantus   Hx of depression, insomnia. - continue zoloft, ambien  BPH. - continue ditropan  Constipation. - continue colace  Intertrigo. - continue nystatin powder  DISPO:   Best practice:  Diet: carb modified DVT prophylaxis: Lovenox GI prophylaxis: protonix Mobility: BR Code Status: Full Disposition: okay to transfer to progressive care from PCCM stand point >> Defer to hospitalist.  Labs    CMP Latest Ref Rng & Units 03/19/2020 03/18/2020 03/17/2020  Glucose 70 - 99 mg/dL 171(H) 175(H) 98  BUN 8 - 23 mg/dL 26(H) 26(H) 33(H)  Creatinine 0.61 - 1.24 mg/dL 1.01 0.87 0.97  Sodium 135 - 145 mmol/L 133(L) 134(L) 136  Potassium 3.5 - 5.1 mmol/L 4.1 4.1 4.0  Chloride 98 - 111 mmol/L 104 104 104  CO2 22 - 32 mmol/L 22 22 24   Calcium 8.9 - 10.3 mg/dL 8.2(L) 8.3(L) 8.3(L)  Total Protein 6.5 - 8.1 g/dL - 6.8 -  Total Bilirubin 0.3 - 1.2 mg/dL - 0.7 -  Alkaline Phos 38 - 126 U/L - 76 -  AST 15 - 41 U/L - 74(H) -  ALT 0 - 44 U/L - 66(H) -    CBC Latest Ref Rng & Units 03/19/2020 03/18/2020 03/17/2020  WBC 4.0 - 10.5 K/uL 6.3 7.4 7.5  Hemoglobin 13.0 - 17.0 g/dL 11.4(L) 11.8(L) 12.1(L)  Hematocrit 39 - 52 % 35.2(L) 37.4(L) 37.6(L)  Platelets 150 - 400 K/uL 235 227 203    ABG    Component Value Date/Time   PHART 7.405 03/15/2020 2305   PCO2ART 40.4 03/15/2020 2305   PO2ART 113 (H) 03/15/2020 2305   HCO3 25.5 03/15/2020 2305   TCO2 27 03/15/2020 2305   O2SAT 99.0 03/15/2020 2305    CBG (last 3)  Recent Labs    03/18/20 2119 03/19/20 0708 03/19/20 1106  GLUCAP 254* 142* 184*    Signature:  Eulis Foster, MD [facility]  PGY-2  Pager - [phone] 03/19/2020, 11:41 AM

## 2020-03-19 NOTE — Progress Notes (Signed)
Pt performed NIF an VC w/good effort. Pts VC 1.5 L, NIF -38. RT will continue to monitor.

## 2020-03-20 LAB — GLUCOSE, CAPILLARY
Glucose-Capillary: 130 mg/dL — ABNORMAL HIGH (ref 70–99)
Glucose-Capillary: 187 mg/dL — ABNORMAL HIGH (ref 70–99)
Glucose-Capillary: 217 mg/dL — ABNORMAL HIGH (ref 70–99)
Glucose-Capillary: 220 mg/dL — ABNORMAL HIGH (ref 70–99)

## 2020-03-20 LAB — BASIC METABOLIC PANEL
Anion gap: 7 (ref 5–15)
BUN: 27 mg/dL — ABNORMAL HIGH (ref 8–23)
CO2: 21 mmol/L — ABNORMAL LOW (ref 22–32)
Calcium: 8.4 mg/dL — ABNORMAL LOW (ref 8.9–10.3)
Chloride: 105 mmol/L (ref 98–111)
Creatinine, Ser: 0.98 mg/dL (ref 0.61–1.24)
GFR calc Af Amer: >60 mL/min (ref >60)
GFR calc non Af Amer: >60 mL/min (ref >60)
Glucose, Bld: 152 mg/dL — ABNORMAL HIGH (ref 70–99)
Potassium: 4.1 mmol/L (ref 3.5–5.1)
Sodium: 133 mmol/L — ABNORMAL LOW (ref 135–145)

## 2020-03-20 LAB — CBC WITH DIFFERENTIAL/PLATELET
Abs Immature Granulocytes: 0.05 10*3/uL (ref 0.00–0.07)
Basophils Absolute: 0 10*3/uL (ref 0.0–0.1)
Basophils Relative: 0 %
Eosinophils Absolute: 0 10*3/uL (ref 0.0–0.5)
Eosinophils Relative: 0 %
HCT: 36.2 % — ABNORMAL LOW (ref 39.0–52.0)
Hemoglobin: 11.7 g/dL — ABNORMAL LOW (ref 13.0–17.0)
Immature Granulocytes: 1 %
Lymphocytes Relative: 25 %
Lymphs Abs: 1.7 10*3/uL (ref 0.7–4.0)
MCH: 29.1 pg (ref 26.0–34.0)
MCHC: 32.3 g/dL (ref 30.0–36.0)
MCV: 90 fL (ref 80.0–100.0)
Monocytes Absolute: 0.3 10*3/uL (ref 0.1–1.0)
Monocytes Relative: 5 %
Neutro Abs: 4.8 10*3/uL (ref 1.7–7.7)
Neutrophils Relative %: 69 %
Platelets: 271 10*3/uL (ref 150–400)
RBC: 4.02 MIL/uL — ABNORMAL LOW (ref 4.22–5.81)
RDW: 13.4 % (ref 11.5–15.5)
WBC: 6.9 10*3/uL (ref 4.0–10.5)
nRBC: 0 % (ref 0.0–0.2)

## 2020-03-20 MED ORDER — ATROPINE SULFATE 1 MG/10ML IJ SOSY: 1.0000 mg | PREFILLED_SYRINGE | Freq: Once | INTRAMUSCULAR | Status: DC

## 2020-03-20 MED ORDER — EPINEPHRINE 1 MG/10ML IJ SOSY: 1.0000 mg | PREFILLED_SYRINGE | Freq: Once | INTRAMUSCULAR | Status: DC

## 2020-03-20 MED ORDER — BISACODYL 10 MG RE SUPP
10.0000 mg | Freq: Once | RECTAL | Status: AC
  Administered 2020-03-20: 10 mg via RECTAL
  Filled 2020-03-20: qty 1

## 2020-03-20 MED ORDER — EPINEPHRINE NICU 0.1 MG/ML INJECTION: 0.1000 mL/kg | Freq: Once | INTRAMUSCULAR | Status: DC

## 2020-03-20 MED ORDER — SODIUM BICARBONATE 8.4 % IV SOLN: 100.0000 meq | Freq: Once | INTRAVENOUS | Status: DC

## 2020-03-20 NOTE — Progress Notes (Signed)
Patient performed VC 1.5L and NIF -39. Good patient effort. RT will continue to monitor.

## 2020-03-20 NOTE — Progress Notes (Signed)
NMO has come back positive at 6.5.   On day 4/5 IVIG.   Electronically signed: Dr. Kerney Elbe

## 2020-03-20 NOTE — Progress Notes (Signed)
Inpatient Rehabilitation-Admissions Coordinator   Met with pt and his wife bedside for ongoing discussions regarding CIR and post acute rehab. I have discussed this case with PM&R MD, Dr. Naaman Plummer. Feel pt is an appropriate candidate for CIR given rehab needs. Pt and his wife still interested in the IP Rehab program. It appears pt will finish up his IVIG treatment soon . Discussed we will follow up Monday for possible admit, pending bed availability and medical readiness.  All questions answered.   Raechel Ache, OTR/L  Rehab Admissions Coordinator  [phone] 03/20/2020 4:36 PM

## 2020-03-20 NOTE — H&P (Addendum)
Physical Medicine and Rehabilitation Admission H&P    Chief Complaint  Patient presents with  . Chest Pain  : HPI: Kaide Gage. Prom is a 74 year old right-handed male with history of remote MI stenting x3 in the 1990s maintained on aspirin, diabetes mellitus with neuropathy, hyperlipidemia, obesity with BMI 29.42.  Per chart review lives with spouse and 46 year old grandson.  History taken from chart review and patient.  Reportedly independent prior to admission.  Two-level home half bath on main level bedroom upstairs.  He presented on 02/22/2020 with chest pain to his back associated shortness of breath and diaphoresis as well as reported some right leg weakness then quickly had bilateral lower extremity weakness as well as numbness in both hands.  Troponin unremarkable and chemistries within normal limits except glucose 229.  Chest x-ray no acute process.  CT angiogram of chest negative for aortic aneurysm or dissection.  No acute findings in the chest abdomen or pelvis.  MRI thoracic spine showed a 6 x 10 mm enhancing lesion at left T9 vertebral body appeared nonspecific.  No cord compression or cause for myelopathy identified.  MRI cervical spine normal enhancement no mass lesion.  Lumbar puncture CSF cell count 1, protein 53, IgG index normal.  Cranial CT/MRI scan unremarkable for acute intracranial process.  Neurology follow-up initial work-up for transverse myelitis however work-up had not been indicated of of that.  He was placed on high-dose IV Solu-Medrol x5 days starting 03/10/2020.  He completed course of steroids 03/14/2020.  Patient suddenly had acute shortness of breath respiratory distress around 5:45 PM on the evening of 03/15/2020.  Rapid response was called he was moved to the ICU.  Patient initially required nonrebreather.  Chest x-ray and abdominal films were done chest x-ray showed possibly infiltrates or atelectasis.  MRI cervical spine T-spine repeated by neurology which showed  progressive cord edema extending from C4-5 to T4-5 with mild patchiness seen.  Progression again favored myelitis as well as NMO came back positive with serum MOG antibody negative and serum paraneoplastic panel also pending.  He was again started on IVIG 03/16/2020 and course has been completed.  Hospital course further complicated by HCAP and he was started on Maxipime for HCAP .  During hospital course and work-up cardiology services consulted in regards to possible need for possibly atrial fibrillation being a contributor to spinal cord infarction recommendations were for 30-day cardiac event monitor which should be arranged at discharge and no further recommendations were made.  Please see preadmission assessment from earlier today as well.  Review of Systems  Constitutional: Negative for chills and fever.  HENT: Negative for hearing loss.   Eyes: Negative for blurred vision and double vision.  Respiratory: Negative for cough and shortness of breath.   Cardiovascular: Negative for chest pain and palpitations.  Gastrointestinal: Positive for constipation. Negative for heartburn, nausea and vomiting.  Genitourinary: Negative for dysuria, flank pain and hematuria.  Musculoskeletal: Positive for joint pain and myalgias.  Skin: Negative for rash.  Neurological: Positive for focal weakness.  Psychiatric/Behavioral: Positive for depression.  All other systems reviewed and are negative.  Past Medical History:  Diagnosis Date  . Allergy   . CAD (coronary artery disease)    a. angioplasty of his RCA in 1990. b. bare metal stent placed in the RCA in 2000 followed by rotational atherectomy shortly after for stent restenosis. c. last cath was in 2012 showing stable moderate diffuse CAD. (70% mid LAD, 80% diagonal, 70% Ramus, 40% mid  to distal RCA stent restenosis). d. Low risk nuc in 2015.  . Diabetes mellitus   . Diverticulosis   . Elevated CK   . Erectile dysfunction   . Hemorrhoids   . HTN  (hypertension)   . Hyperlipidemia   . Hypertriglyceridemia   . Malignant melanoma of left side of neck ([place]) 10/25/2018  . Myocardial infarction ([place])   . Obesity   . OSA (obstructive sleep apnea)   . Persistent disorder of initiating or maintaining sleep   . Personal history of colonic polyps 02/05/2003   Past Surgical History:  Procedure Laterality Date  . CHOLECYSTECTOMY    . CORONARY ANGIOPLASTY    . CORONARY STENT PLACEMENT     stenting of the right coronary artery with followup rotational  atherectomy. (3 stents placed)  . FINGER SURGERY     right  . FOOT SURGERY     right  . INGUINAL HERNIA REPAIR     right  . melanoma removal     neck  . ORTHOPEDIC SURGERY     foot right  . rotator cuff surg     Bil   Family History  Problem Relation Age of Onset  . Liver cancer Father   . Lung cancer Father   . Heart disease Father   . Heart disease Sister   . Lung cancer Mother   . COPD Mother   . Obesity Mother   . Colon cancer Neg Hx   . Stomach cancer Neg Hx   . Pancreatic cancer Neg Hx    Social History:  reports that he quit smoking about 39 years ago. His smoking use included cigarettes. He has a 45.00 pack-year smoking history. He has never used smokeless tobacco. He reports current alcohol use of about 7.0 - 14.0 standard drinks of alcohol per week. He reports that he does not use drugs. Allergies:  Allergies  Allergen Reactions  . Levofloxacin Rash  . Penicillins Hives and Rash   Medications Prior to Admission  Medication Sig Dispense Refill  . aspirin EC 81 MG tablet Take 81 mg by mouth at bedtime. Swallow whole.    . empagliflozin (JARDIANCE) 25 MG TABS tablet Take 12.5 mg by mouth daily.    . fenofibrate 160 MG tablet Take 160 mg by mouth at bedtime.     . fluticasone (FLONASE) 50 MCG/ACT nasal spray SPRAY 2 SPRAYS INTO EACH NOSTRIL EVERY DAY (Patient taking differently: Place 2 sprays into both nostrils at bedtime. ) 48 mL 3  . ibuprofen (ADVIL) 200 MG  tablet Take 600 mg by mouth daily as needed for headache (pain).    [name] Ethyl (VASCEPA) 1 g CAPS Take 2 capsules (2 g total) by mouth 2 (two) times daily. 120 capsule 3  . insulin aspart (NOVOLOG FLEXPEN) 100 UNIT/ML FlexPen Inject 25-30 Units into the skin 3 (three) times daily before meals.     Marland Kitchen lisinopril (ZESTRIL) 10 MG tablet Take 10 mg by mouth daily. For kidney protection    . LORazepam (ATIVAN) 1 MG tablet TAKE 1 TABLET BY MOUTH TWICE A DAY AS NEEDED FOR ANXIETY (Patient taking differently: Take 1 mg by mouth See admin instructions. Take one tablet (1 mg) by mouth daily at bedtime, may also take one tablet (1 mg) during the day as needed for anxiety) 180 tablet 0  . metFORMIN (GLUCOPHAGE) 1000 MG tablet Take 1,000 mg by mouth 2 (two) times daily with a meal.    . Multiple Vitamin (MULTIVITAMIN WITH MINERALS)  TABS tablet Take 1 tablet by mouth daily. Centrum Silver Men's    . oxybutynin (DITROPAN) 5 MG tablet Take 5 mg by mouth 2 (two) times daily.     . rosuvastatin (CRESTOR) 40 MG tablet Take 40 mg by mouth at bedtime.     . sertraline (ZOLOFT) 100 MG tablet Take 150 mg by mouth at bedtime.     . vitamin B-12 (CYANOCOBALAMIN) 500 MCG tablet Take 500 mcg by mouth daily.    Marland Kitchen zolpidem (AMBIEN) 10 MG tablet Take 10 mg by mouth at bedtime. For insomnia     . [DISCONTINUED] insulin glargine (LANTUS) 100 unit/mL SOPN Inject 60 Units into the skin at bedtime.     . benzonatate (TESSALON) 100 MG capsule Take 1 capsule (100 mg total) by mouth 2 (two) times daily as needed for cough. (Patient not taking: Reported on 03/06/2020) 20 capsule 0  . lisinopril (ZESTRIL) 20 MG tablet Take 1 tablet (20 mg total) by mouth daily. 90 tablet 1  . triamcinolone ointment (KENALOG) 0.1 % Apply 1 application topically 2 (two) times daily. (Patient not taking: Reported on 03/08/2020) 80 g 1    Drug Regimen Review Drug regimen was reviewed and remains appropriate with no significant issues  identified  Home: Home Living Family/patient expects to be discharged to:: Private residence Living Arrangements: Spouse/significant other, Other relatives (59 y/o grandson) Available Help at Discharge: Family, Available 24 hours/day Type of Home: House Home Access: Stairs to enter [employer] of Steps: 2 Entrance Stairs-Rails: None Home Layout: Two level, 1/2 bath on main level, Bed/bath upstairs Alternate Level Stairs-Number of Steps: flight Bathroom Shower/Tub: Chiropodist: Standard Home Equipment: None   Functional History: Prior Function Level of Independence: Independent  Functional Status:  Mobility: Bed Mobility Overal bed mobility: Needs Assistance Bed Mobility: Rolling, Sidelying to Sit Rolling: Mod assist Sidelying to sit: Total assist, +2 for safety/equipment Supine to sit: Total assist, +2 for physical assistance, +2 for safety/equipment Sit to supine: +2 for physical assistance, Total assist General bed mobility comments: in good supported sitting position for feeding task in b ed Transfers Overall transfer level: Needs assistance Equipment used: Ambulation equipment used Transfer via Lift Equipment: Maximove Transfers: Public house manager Squat pivot transfers: Max assist, +2 physical assistance General transfer comment: unable to attempt today due to increased fatigue and incontinence of stool  Ambulation/Gait Ambulation/Gait assistance:  (pt unable)    ADL: ADL Overall ADL's : Needs assistance/impaired Eating/Feeding: Set up, Bed level, With adaptive utensils Eating/Feeding Details (indicate cue type and reason): Pt feeding self 50% of meal with adapted/built up fork. Pt able to use LUE to feed self bagel after butter applied. Pt required coffee to be poured into styrofoam cup. Pt seen for 2nd session to administer 2nd red tubing for self feeding and for NT to be present to encourage self feeding/rdrinking. Grooming:  Minimal assistance, Bed level Grooming Details (indicate cue type and reason): educated pt on using u cuff for brushing teeth, as well as compensating with electric toothbrush Upper Body Bathing: Maximal assistance, Sitting, Bed level Lower Body Bathing: Total assistance, Sitting/lateral leans, Sit to/from stand, Bed level Upper Body Dressing : Sitting, Bed level, Maximal assistance Lower Body Dressing: Total assistance, Sitting/lateral leans, Sit to/from stand, Bed level Toilet Transfer: Total assistance Toilet Transfer Details (indicate cue type and reason): assumes full extensor tone when taken EOB Toileting- Clothing Manipulation and Hygiene: Total assistance General ADL Comments: Pt session focused on self feeding and NT to  observe to encourage self feed  Cognition: Cognition Overall Cognitive Status: Within Functional Limits for tasks assessed Orientation Level: Oriented X4 Cognition Arousal/Alertness: Awake/alert Behavior During Therapy: WFL for tasks assessed/performed Overall Cognitive Status: Within Functional Limits for tasks assessed General Comments: pt continues to be optimistic and agreeable to therapy   Physical Exam: Blood pressure (!) 143/56, pulse (!) 51, temperature 97.6 F (36.4 C), temperature source Axillary, resp. rate 20, height 6' (1.829 m), weight 108.6 kg, SpO2 96 %. Physical Exam Vitals reviewed.  Constitutional:      Appearance: He is well-developed. He is obese.  HENT:     Head: Normocephalic and atraumatic.     Comments: Right external ear normal Left external ear normal Eyes:     Extraocular Movements: Extraocular movements intact.  Cardiovascular:     Rate and Rhythm: Normal rate and regular rhythm.  Pulmonary:     Effort: Pulmonary effort is normal. No tachypnea.     Breath sounds: No stridor.  Abdominal:     Palpations: Abdomen is soft.     Comments: Nondistended  Musculoskeletal:     Cervical back: Neck supple.     Comments: No edema or  tenderness in extremities  Skin:    General: Skin is warm and dry.  Neurological:     Mental Status: He is alert.     Comments: Patient is alert in no acute distress.   Oriented x3 and follows commands.   Cooperative with exam Sensation intact to light touch Motor:  RUE: Shoulder abduction, elbow flexion 5/5, wrist extension 4+/5, elbow ext 4+/5, hand grip 1+/5 LUE: Shoulder abduction, elbow flexion 5/5, wrist extension 4+/5, elbow ext 3+/5, hand grip 1/5 B/l LE: 0/5 proxima to distal  Psychiatric:        Mood and Affect: Mood normal.        Behavior: Behavior normal.     Results for orders placed or performed during the hospital encounter of 03/08/20 (from the past 48 hour(s))  Glucose, capillary     Status: Abnormal   Collection Time: 03/23/20 12:10 PM  Result Value Ref Range   Glucose-Capillary 191 (H) 70 - 99 mg/dL    Comment: Glucose reference range applies only to samples taken after fasting for at least 8 hours.   Comment 1 Notify RN    Comment 2 Document in Chart   Glucose, capillary     Status: Abnormal   Collection Time: 03/23/20  5:12 PM  Result Value Ref Range   Glucose-Capillary 202 (H) 70 - 99 mg/dL    Comment: Glucose reference range applies only to samples taken after fasting for at least 8 hours.   Comment 1 Notify RN    Comment 2 Document in Chart   Glucose, capillary     Status: Abnormal   Collection Time: 03/23/20  9:10 PM  Result Value Ref Range   Glucose-Capillary 232 (H) 70 - 99 mg/dL    Comment: Glucose reference range applies only to samples taken after fasting for at least 8 hours.  Glucose, capillary     Status: Abnormal   Collection Time: 03/24/20  7:45 AM  Result Value Ref Range   Glucose-Capillary 125 (H) 70 - 99 mg/dL    Comment: Glucose reference range applies only to samples taken after fasting for at least 8 hours.  Glucose, capillary     Status: Abnormal   Collection Time: 03/24/20  1:07 PM  Result Value Ref Range   Glucose-Capillary  160 (H) 70 -  99 mg/dL    Comment: Glucose reference range applies only to samples taken after fasting for at least 8 hours.  Glucose, capillary     Status: Abnormal   Collection Time: 03/24/20  5:32 PM  Result Value Ref Range   Glucose-Capillary 200 (H) 70 - 99 mg/dL    Comment: Glucose reference range applies only to samples taken after fasting for at least 8 hours.  Glucose, capillary     Status: Abnormal   Collection Time: 03/24/20  9:10 PM  Result Value Ref Range   Glucose-Capillary 249 (H) 70 - 99 mg/dL    Comment: Glucose reference range applies only to samples taken after fasting for at least 8 hours.  Basic metabolic panel     Status: Abnormal   Collection Time: 03/25/20  7:53 AM  Result Value Ref Range   Sodium 135 135 - 145 mmol/L   Potassium 4.2 3.5 - 5.1 mmol/L   Chloride 106 98 - 111 mmol/L   CO2 22 22 - 32 mmol/L   Glucose, Bld 130 (H) 70 - 99 mg/dL    Comment: Glucose reference range applies only to samples taken after fasting for at least 8 hours.   BUN 29 (H) 8 - 23 mg/dL   Creatinine, Ser 0.95 0.61 - 1.24 mg/dL   Calcium 8.6 (L) 8.9 - 10.3 mg/dL   GFR calc non Af Amer >60 >60 mL/min   GFR calc Af Amer >60 >60 mL/min   Anion gap 7 5 - 15    Comment: Performed at [facility] [address], [place], [place] [address]  CBC with Differential/Platelet     Status: Abnormal   Collection Time: 03/25/20  7:53 AM  Result Value Ref Range   WBC 4.4 4.0 - 10.5 K/uL   RBC 3.89 (L) 4.22 - 5.81 MIL/uL   Hemoglobin 11.1 (L) 13.0 - 17.0 g/dL   HCT 35.5 (L) 39 - 52 %   MCV 91.3 80.0 - 100.0 fL   MCH 28.5 26.0 - 34.0 pg   MCHC 31.3 30.0 - 36.0 g/dL   RDW 13.7 11.5 - 15.5 %   Platelets 269 150 - 400 K/uL   nRBC 0.0 0.0 - 0.2 %   Neutrophils Relative % 78 %   Neutro Abs 3.4 1.7 - 7.7 K/uL   Lymphocytes Relative 14 %   Lymphs Abs 0.6 (L) 0.7 - 4.0 K/uL   Monocytes Relative 5 %   Monocytes Absolute 0.2 0 - 1 K/uL   Eosinophils Relative 2 %   Eosinophils Absolute 0.1  0 - 0 K/uL   Basophils Relative 0 %   Basophils Absolute 0.0 0 - 0 K/uL   Immature Granulocytes 1 %   Abs Immature Granulocytes 0.03 0.00 - 0.07 K/uL    Comment: Performed at [facility], [place] [address], [place], [place] [address]  Glucose, capillary     Status: Abnormal   Collection Time: 03/25/20  8:01 AM  Result Value Ref Range   Glucose-Capillary 116 (H) 70 - 99 mg/dL    Comment: Glucose reference range applies only to samples taken after fasting for at least 8 hours.  Glucose, capillary     Status: Abnormal   Collection Time: 03/25/20 10:58 AM  Result Value Ref Range   Glucose-Capillary 197 (H) 70 - 99 mg/dL    Comment: Glucose reference range applies only to samples taken after fasting for at least 8 hours.   No results found.  Medical Problem List and Plan: 1.  Quadriparesis secondary to spinal cord infarct versus transverse myelitis/NMO.  IV IG completed 03/20/2020 as well as received Rituxan dose 03/24/2020.  -patient may shower  -ELOS/Goals: 27-30 days/Mod A  Admit to CIR 2.  Antithrombotics: -DVT/anticoagulation: Lovenox     Vascular study ordered  -antiplatelet therapy: Aspirin 81 mg daily 3. Pain Management: Lidoderm patch, tramadol as needed 4. Mood: Zoloft 150 mg nightly, Ativan as needed  -antipsychotic agents: N/A 5. Neuropsych: This patient is capable of making decisions on his own behalf. 6. Skin/Wound Care: Routine skin checks 7. Fluids/Electrolytes/Nutrition: Routine in and outs. CMP ordered 8.  Steroid-induced hyperglycemia on diabetes mellitus.  Hemoglobin A1c 6.7.  Lantus insulin 30 units daily  Monitor with increased mobility 9. HCAP/OSA.  Completed course of Maxipime initiated 03/18/2020 x14 doses.  Continue BiPAP 10.  Hyperlipidemia. Lipitor/Vascepa 11.  Hypertension.  Lisinopril 20 mg daily  Monitor with increased mobility  Cathlyn Parsons, PA-C 03/25/2020  I have personally performed a face to face diagnostic evaluation, including, but  not limited to relevant history and physical exam findings, of this patient and developed relevant assessment and plan.  Additionally, I have reviewed and concur with the physician assistant's documentation above.  Delice Lesch, MD, ABPMR

## 2020-03-20 NOTE — Progress Notes (Signed)
Patient performed NIF and VC with good effort  NIF -45 VC 1.1L

## 2020-03-20 NOTE — Progress Notes (Signed)
PROGRESS NOTE    Brent [name].  MEQ:[id number] DOB: 04-01-1946 DOA: 03/08/2020 PCP: [name], [name], MD    Brief Narrative:  74 y.o. male with medical history significant of remote MI stenting x3 in 1990s, CAD, IDDM, DM neuropathy, sciatica with injection treatment 2 years ago, HTN, HLD, came with new onset of chest pain which started when patient was in the church and standing.  Pressure-like 8-9 over 10, associated with feeling of nausea and sweating no vomiting no short of breath no palpitations.  EMS arrived and gave patient some aspirin nitroglycerin sublingual with little improvement.  Then upon arrival in the ED the chest pain slowly progressed to radiate to the left shoulder and his back, then slowly subsided.  While staying in the ED patient started to feel weakness of the whole right-sided leg, he remember that while lying on the stretcher in the ambulance, the stretcher had a bump right beneath his right hip area, where he had " bad inflamed nerve which received injection about 2 years ago in [place]".   He denieD any numbness or loss of feeling but only weakness from hip to toe on the right side.  He denied baseline claudication, he does have chronic back pain on and off.  Patient also admitted he has a baseline diabetic neuropathy with frequent on and off tingling sensation of toes and fingertips. Upon arrival to ED, troponin II sets negative, CTA dissection study negative.  Patient was admitted to hospital service.  Cardiology was consulted.  He underwent nuclear stress test which was unremarkable.  He then started having weakness in left lower extremity as well as decreased grip strength in bilateral hands.  Neurology was consulted. C Spine MRI was ordered which was without any remarkable acute findings per official radiology report however per neurology MRI spine shows subtle signal change on cervical, thoracic spine and he had discussed with neuroradiology as well.  T-spine MRI  showed 6 x 10 mm enhancing lesion left T9 vertebral body, low signal on T1 and high signal on T2.  CT head was unremarkable.  LP was also unremarkable.  His initial working diagnosis was transverse myelitis however the work-up has not been indicative of that.  Patient was started on high-dose Solu-Medrol 1000 mg IV daily for 5 days starting 03/10/2020.  Patient was stable but was not progressing as far as the weakness goes.  He completed his high-dose Solu-Medrol on 03/14/2020.  Patient suddenly had acute shortness of breath/respiratory distress at around 5:45 PM on the evening of 03/15/2020.  Rapid response was called.  Patient was moved to ICU.  PCCM was consulted.  Patient initially required nonrebreather.  Chest x-ray and abdominal x-ray were done.  Chest x-ray shows possibility of infiltrates or atelectasis.  MRI C-spine and T-spine was repeated by neurology on 03/08/2020 which showed progressive cord edema extending from C4-5 to T4-5 with mild patchiness been.  Progression favors myelitis.  Patient started on IVIG on 03/16/2020.  Assessment & Plan:   Active Problems:   Chest pain   Weakness   Quadriplegia (HCC)   Coronary artery disease involving native coronary artery of native heart without angina pectoris   Steroid-induced hyperglycemia   Diabetic peripheral neuropathy (HCC)   Tachypnea   Hyponatremia   AKI (acute kidney injury) ([facility])   Acute hypoxemic respiratory failure (HCC)   Acute on chronic respiratory failure with hypoxia (HCC)  Chest pain -Pt has significant prior cardiac history -Given risk factors, Cardiology consulted and pt  underwent nuclear stress test on 6/21, reviewed, with no reversible ischemia or infarct. LVEF of 53%, normal LV wall motion. Low risk study -Continue his cardiac meds as tolerated  Progressive BLE weakness, numbness, loss of B hand grip strength  -new findings since initial presentation -Symptoms initially with RLE, since progressing to involve BLE  weakness and B grip strength.  -Appreciate assistance by Neurology.  C Spine MRI was ordered which was without any remarkable acute findings per official radiology report however per neurology MRI spine shows subtle signal change on cervical, thoracic spine and he had discussed with neuroradiology as well.  T-spine MRI showed 6 x 10 mm enhancing lesion left T9 vertebral body, low signal on T1 and high signal on T2.  CT head was unremarkable.  LP was also unremarkable.  He was started on high-dose Solu-Medrol for 5 days with first day starting on 03/10/2020.  The work-up so far has not been indicative of transverse myelitis but this along with possible spinal infarct is so far his working diagnosis per neurology and he is getting Solu-Medrol empirically for that.  He has not noted any improvement in his weakness since started on Solu-Medrol.  He is going to receive his last dose today.Dr. Lorraine Lax discussed case with [facility] neuro immunologist on 03/15/2020 who had declined transfer and opined that there is nothing further that they have to offer and agreed with the treatment that patient is receiving here.  Due to consideration of possible infarct, I discussed with Dr. Lorraine Lax about possibility of arranging loop recorder for this patient.  He agreed with that plan.  We have not seen any atrial fibrillation on telemetry here.  I consulted cardiology on 03/15/2020 who planned to do a event monitor for 30 days in near future.  Patient had repeat MRI of the C and T-spine today ordered by neurology which shows progressive cord edema extending from C4-5 to T4-5 with mild patchiness been.  Progression favors myelitis.  Patient started on IVIG on 03/16/2020 for 5 days. neuromyelitis optica Ab positive from 6/24. B burgdorfi Ab negative . Lyme DNA still in process.  Patient states that he in fact feels better.  He is unable to wiggle his toes in front of me and I do not see any improvement in his hand grip either but he tells me that  intermittently, he is able to move his toes now.  He is still on IVIG.  No note from neurology last few days.  Further management deferred to neurology.  Acute hypoxic respiratory failure/bilateral atelectasis/neuromuscular weakness of unknown origin/aspiration pneumonia: Patient suddenly had acute shortness of breath/respiratory distress at around 5:45 PM on the evening of 03/15/2020.  Rapid response was called.  Patient was moved to ICU.  PCCM was consulted.  Patient initially required nonrebreather.  Chest x-ray and abdominal x-ray were done.  Chest x-ray shows possibility of infiltrates or atelectasis, worsened.  Patient has remained afebrile without any leukocytosis.  Procalcitonin is stable.  Started on cefepime on 03/18/2020.  Continue that.  He is now comfortable on room air.  IDDM/type 2 diabetes mellitus Blood sugar fairly stable.  Continue Lantus 30 units and SSI.  Essential HTN -Continue home meds -BP stable and controlled at this time  HLD -Continue with statin as tolerated  Obesity: -Recommend diet/lifestyle modification  Hx malignant melanoma -chart reviewed. Pt s/p biopsy on 10/19, followed by [facility] -Seems to be stable currently  AKI/dehydration: Came in with normal GFR.  Creatinine jumped to 1.74.  AKI has  resolved.   Constipation: Had another bowel movement yesterday.  Will give him another dose of Dulcolax suppository.  DVT prophylaxis: Place and maintain sequential compression device Start: 03/09/20 1849 And Lovenox Code Status: Full Family Communication: Pt in room, family currently not at bedside  Status is: Inpatient  Remains inpatient appropriate because:Ongoing diagnostic testing needed not appropriate for outpatient work up and Inpatient level of care appropriate due to severity of illness  Dispo: The patient is from: Home              Anticipated d/c is to: CIR              Anticipated d/c date is: Uncertain.              Patient currently is not  medically stable to d/c.     Consultants:   Cardiology  Neurology  Procedures:   Stress test 6/21  Antimicrobials: Anti-infectives (From admission, onward)   Start     Dose/Rate Route Frequency Ordered Stop   03/18/20 1000  ceFEPIme (MAXIPIME) 2 g in sodium chloride 0.9 % 100 mL IVPB     Discontinue     2 g 200 mL/hr over 30 Minutes Intravenous Every 8 hours 03/18/20 0849 03/22/20 2359      Subjective: Seen and examined.  Feels much better.  Feels a little stronger.  No shortness of breath.  No other complaint.  Tells me that he felt slight movement in left foot.  But he is unable to initiate any movement in lower extremities during my encounter.  Objective: Vitals:   03/20/20 1000 03/20/20 1100 03/20/20 1200 03/20/20 1223  BP: 139/70 (!) 159/65 (!) 151/61   Pulse: (!) 56 (!) 52 (!) 54   Resp: 18 18 19    Temp:    98 F (36.7 C)  TempSrc:    Oral  SpO2: 95% 94% 95%   Weight:      Height:        Intake/Output Summary (Last 24 hours) at 03/20/2020 1231 Last data filed at 03/20/2020 0800 Gross per 24 hour  Intake 1868.67 ml  Output 3250 ml  Net -1381.33 ml   Filed Weights   03/08/20 2044 03/12/20 0619 03/15/20 1905  Weight: 98.4 kg 102.7 kg 110.3 kg    Examination:  General exam: Appears calm and comfortable, morbidly obese Respiratory system: Clear to auscultation. Respiratory effort normal. Cardiovascular system: S1 & S2 heard, RRR. No JVD, murmurs, rubs, gallops or clicks. No pedal edema. Gastrointestinal system: Abdomen is nondistended, soft and nontender. No organomegaly or masses felt. Normal bowel sounds heard. Central nervous system: Alert and oriented.  0/5 power in bilateral lower extremities.  Weak handgrip bilaterally. Skin: No rashes, lesions or ulcers.  Psychiatry: Judgement and insight appear normal. Mood & affect appropriate.   Data Reviewed: I have personally reviewed following labs and imaging studies  CBC: Recent Labs  Lab 03/15/20 0138  03/15/20 2305 03/16/20 0215 03/17/20 0216 03/18/20 0324 03/19/20 0340 03/20/20 0236  WBC 10.4  --  9.5 7.5 7.4 6.3 6.9  NEUTROABS 7.5  --  7.0 5.5  --  4.4 4.8  HGB 13.6   < > 13.5 12.1* 11.8* 11.4* 11.7*  HCT 42.8   < > 41.5 37.6* 37.4* 35.2* 36.2*  MCV 91.5  --  90.0 90.4 90.6 89.6 90.0  PLT 218  --  210 203 227 235 271   < > = values in this interval not displayed.   Basic Metabolic Panel: Recent  Labs  Lab 03/16/20 0215 03/17/20 0216 03/18/20 0324 03/19/20 0340 03/20/20 0236  NA 139 136 134* 133* 133*  K 4.3 4.0 4.1 4.1 4.1  CL 106 104 104 104 105  CO2 23 24 22 22  21*  GLUCOSE 66* 98 175* 171* 152*  BUN 33* 33* 26* 26* 27*  CREATININE 0.94 0.97 0.87 1.01 0.98  CALCIUM 8.7* 8.3* 8.3* 8.2* 8.4*  MG 2.2  --   --   --   --   PHOS 4.0  --   --   --   --    GFR: Estimated Creatinine Clearance: 83.6 mL/min (by C-G formula based on SCr of 0.98 mg/dL). Liver Function Tests: Recent Labs  Lab 03/18/20 0324  AST 74*  ALT 66*  ALKPHOS 76  BILITOT 0.7  PROT 6.8  ALBUMIN 2.0*   No results for input(s): LIPASE, AMYLASE in the last 168 hours. No results for input(s): AMMONIA in the last 168 hours. Coagulation Profile: No results for input(s): INR, PROTIME in the last 168 hours. Cardiac Enzymes: Recent Labs  Lab 03/15/20 1921  CKTOTAL 119   BNP (last 3 results) No results for input(s): PROBNP in the last 8760 hours. HbA1C: No results for input(s): HGBA1C in the last 72 hours. CBG: Recent Labs  Lab 03/19/20 1106 03/19/20 1520 03/19/20 2139 03/20/20 0722 03/20/20 1225  GLUCAP 184* 209* 181* 130* 187*   Lipid Profile: No results for input(s): CHOL, HDL, LDLCALC, TRIG, CHOLHDL, LDLDIRECT in the last 72 hours. Thyroid Function Tests: No results for input(s): TSH, T4TOTAL, FREET4, T3FREE, THYROIDAB in the last 72 hours. Anemia Panel: No results for input(s): VITAMINB12, FOLATE, FERRITIN, TIBC, IRON, RETICCTPCT in the last 72 hours. Sepsis Labs: Recent Labs    Lab 03/15/20 1921 03/16/20 0215 03/17/20 0216 03/18/20 0324  PROCALCITON <0.10 0.16 0.33 0.24  LATICACIDVEN 0.9  --   --   --     Recent Results (from the past 240 hour(s))  Gram stain     Status: None   Collection Time: 03/10/20  5:15 PM   Specimen: PATH Cytology CSF; Cerebrospinal Fluid  Result Value Ref Range Status   Specimen Description CSF  Final   Special Requests NONE  Final   Gram Stain   Final    WBC PRESENT, PREDOMINANTLY PMN NO ORGANISMS SEEN CYTOSPIN SMEAR Performed at [facility], [place] [address], [place], [place] [address]    Report Status 03/10/2020 FINAL  Final  MRSA PCR Screening     Status: None   Collection Time: 03/16/20 11:49 AM   Specimen: Nasal Mucosa; Nasopharyngeal  Result Value Ref Range Status   MRSA by PCR NEGATIVE NEGATIVE Final    Comment:        The GeneXpert MRSA Assay (FDA approved for NASAL specimens only), is one component of a comprehensive MRSA colonization surveillance program. It is not intended to diagnose MRSA infection nor to guide or monitor treatment for MRSA infections. Performed at [facility], [place] [address] [place], [place] [address]      Radiology Studies: DG Swallowing Func-Speech Pathology  Result Date: 03/18/2020 Objective Swallowing Evaluation: Type of Study: MBS-Modified Barium Swallow Study  Patient Details Name: Ronaldo Crilly. MRN: [id number] Date of Birth: 1946/08/25 Today's Date: 03/18/2020 Time: SLP Start Time (ACUTE ONLY): 1310 -SLP Stop Time (ACUTE ONLY): 1325 SLP Time Calculation (min) (ACUTE ONLY): 15 min Past Medical History: Past Medical History: Diagnosis Date . Allergy  . CAD (coronary artery disease)  a. angioplasty of his RCA in 1990. b. bare metal stent placed in the RCA in 2000 followed by rotational atherectomy shortly after for stent restenosis. c. last cath was in 2012 showing stable moderate diffuse CAD. (70% mid LAD, 80% diagonal, 70% Ramus, 40% mid to distal RCA stent  restenosis). d. Low risk nuc in 2015. . Diabetes mellitus  . Diverticulosis  . Elevated CK  . Erectile dysfunction  . Hemorrhoids  . HTN (hypertension)  . Hyperlipidemia  . Hypertriglyceridemia  . Malignant melanoma of left side of neck ([place]) 10/25/2018 . Myocardial infarction ([place])  . Obesity  . OSA (obstructive sleep apnea)  . Persistent disorder of initiating or maintaining sleep  . Personal history of colonic polyps 02/05/2003 Past Surgical History: Past Surgical History: Procedure Laterality Date . CHOLECYSTECTOMY   . CORONARY ANGIOPLASTY   . CORONARY STENT PLACEMENT    stenting of the right coronary artery with followup rotational  atherectomy. (3 stents placed) . FINGER SURGERY    right . FOOT SURGERY    right . INGUINAL HERNIA REPAIR    right . melanoma removal    neck . ORTHOPEDIC SURGERY    foot right . rotator cuff surg    Bil HPI: 74 year old man with PMH listed below including melanoma presenting with chest pain with neg stress test.  He then developed scattered neurological deficits prompting extensive workup to date.   Pt was evalauted by SLP on 6/23, found ot have weak cough mechanism, but no signs of aspiration, tolerating diet. Thought to be stable in terms of neuro exam until 6/27 when he became more SOB, reported increased LE weakness. Critical care questioned secretion clearance issues and volume overload as well as constipation reducing FRC.   Chest x-ray shows possibility of infiltrates or atelectasis.  MRI performed (6/28) revealed progressive cord edema extending from C4-5 to T4-5 with mild patchy enhancement. Transverse myelitis and cord infarct are the primary considerations, with progression favoring myelitis, per Radiology. However, the abrupt onset on 6/20 is more suggestive of a spinal cord infarction. Initiated empiric IVIG. On a liquid diet.  No data recorded Assessment / Plan / Recommendation CHL IP CLINICAL IMPRESSIONS 03/18/2020 Clinical Impression Pt demosntrates two instances of  trace premature spillage prior to swallow initiation that resulted in laryngeal penetration with eventual ejection via laryngeal squeeze. Otherwise pharyngeal function WNL. Pt has dry oral mucosa, suspect this may reduce oral control and sensation. Problem is mild and not significantly detrimental to pts safety. Recommend pt continue current diet and focus on oral hygiene and hydration. No SLP f/u needed, will sign off.  SLP Visit Diagnosis Dysphagia, oral phase (R13.11) Attention and concentration deficit following -- Frontal lobe and executive function deficit following -- Impact on safety and function Mild aspiration risk   CHL IP TREATMENT RECOMMENDATION 03/18/2020 Treatment Recommendations No treatment recommended at this time   Prognosis 03/17/2020 Prognosis for Safe Diet Advancement Good Barriers to Reach Goals -- Barriers/Prognosis Comment -- CHL IP DIET RECOMMENDATION 03/18/2020 SLP Diet Recommendations Regular solids;Thin liquid Liquid Administration via Cup;Straw Medication Administration Whole meds with liquid Compensations -- Postural Changes Seated upright at 90 degrees   CHL IP OTHER RECOMMENDATIONS 03/18/2020 Recommended Consults -- Oral Care Recommendations Oral care QID Other Recommendations --   CHL IP FOLLOW UP RECOMMENDATIONS 03/18/2020 Follow up Recommendations None   CHL IP FREQUENCY AND DURATION 03/17/2020 Speech Therapy Frequency (ACUTE ONLY) min 2x/week Treatment Duration 2 weeks      CHL IP ORAL PHASE 03/18/2020  Oral Phase Impaired Oral - Pudding Teaspoon -- Oral - Pudding Cup -- Oral - Honey Teaspoon -- Oral - Honey Cup -- Oral - Nectar Teaspoon -- Oral - Nectar Cup -- Oral - Nectar Straw -- Oral - Thin Teaspoon -- Oral - Thin Cup NT Oral - Thin Straw Premature spillage Oral - Puree WFL Oral - Mech Soft -- Oral - Regular WFL Oral - Multi-Consistency -- Oral - Pill -- Oral Phase - Comment --  No flowsheet data found. No flowsheet data found. Herbie Baltimore, MA CCC-SLP Acute Rehabilitation  Services Pager [phone] Office [phone] Lynann Beaver 03/18/2020, 1:37 PM               Scheduled Meds: . aspirin EC  81 mg Oral QHS  . chlorhexidine  15 mL Mouth Rinse BID  . Chlorhexidine Gluconate Cloth  6 each Topical Q0600  . docusate sodium  100 mg Oral BID  . enoxaparin (LOVENOX) injection  50 mg Subcutaneous Q24H  . fenofibrate  160 mg Oral QHS  . fluticasone  2 spray Each Nare QHS  . icosapent Ethyl  2 g Oral BID  . insulin aspart  0-20 Units Subcutaneous TID WC  . insulin aspart  0-5 Units Subcutaneous QHS  . insulin glargine  30 Units Subcutaneous Daily  . lidocaine  1 patch Transdermal Q24H  . mouth rinse  15 mL Mouth Rinse q12n4p  . multivitamin with minerals  1 tablet Oral Daily  . nystatin  1 application Topical TID  . oxybutynin  5 mg Oral BID  . pantoprazole  40 mg Oral Daily  . polyethylene glycol  17 g Oral Daily  . rosuvastatin  40 mg Oral QHS  . sertraline  150 mg Oral QHS  . vitamin B-12  500 mcg Oral Daily  . zolpidem  5 mg Oral QHS   Continuous Infusions: . sodium chloride Stopped (03/17/20 0902)  . ceFEPime (MAXIPIME) IV 2 g (03/20/20 1104)  . Immune Globulin 10% Stopped (03/19/20 2200)     LOS: 11 days   Darliss Cheney, MD [facility] Pager On Amion  If 7PM-7AM, please contact night-coverage 03/20/2020, 12:31 PM

## 2020-03-21 DIAGNOSIS — G9511 Acute infarction of spinal cord (embolic) (nonembolic): Secondary | ICD-10-CM

## 2020-03-21 LAB — CBC WITH DIFFERENTIAL/PLATELET
Abs Immature Granulocytes: 0.09 10*3/uL — ABNORMAL HIGH (ref 0.00–0.07)
Basophils Absolute: 0 10*3/uL (ref 0.0–0.1)
Basophils Relative: 0 %
Eosinophils Absolute: 0.1 10*3/uL (ref 0.0–0.5)
Eosinophils Relative: 1 %
HCT: 35.1 % — ABNORMAL LOW (ref 39.0–52.0)
Hemoglobin: 11.3 g/dL — ABNORMAL LOW (ref 13.0–17.0)
Immature Granulocytes: 1 %
Lymphocytes Relative: 22 %
Lymphs Abs: 1.6 10*3/uL (ref 0.7–4.0)
MCH: 29.4 pg (ref 26.0–34.0)
MCHC: 32.2 g/dL (ref 30.0–36.0)
MCV: 91.2 fL (ref 80.0–100.0)
Monocytes Absolute: 0.3 10*3/uL (ref 0.1–1.0)
Monocytes Relative: 4 %
Neutro Abs: 5.2 10*3/uL (ref 1.7–7.7)
Neutrophils Relative %: 72 %
Platelets: 288 10*3/uL (ref 150–400)
RBC: 3.85 MIL/uL — ABNORMAL LOW (ref 4.22–5.81)
RDW: 13.5 % (ref 11.5–15.5)
WBC: 7.3 10*3/uL (ref 4.0–10.5)
nRBC: 0 % (ref 0.0–0.2)

## 2020-03-21 LAB — GLUCOSE, CAPILLARY
Glucose-Capillary: 165 mg/dL — ABNORMAL HIGH (ref 70–99)
Glucose-Capillary: 167 mg/dL — ABNORMAL HIGH (ref 70–99)
Glucose-Capillary: 181 mg/dL — ABNORMAL HIGH (ref 70–99)
Glucose-Capillary: 177 mg/dL — ABNORMAL HIGH (ref 70–99)

## 2020-03-21 MED ORDER — LEVOFLOXACIN 750 MG PO TABS
750.0000 mg | ORAL_TABLET | Freq: Every day | ORAL | Status: DC
  Administered 2020-03-21: 750 mg via ORAL
  Filled 2020-03-21 (×2): qty 1

## 2020-03-21 MED ORDER — DIPHENHYDRAMINE HCL 25 MG PO CAPS
25.0000 mg | ORAL_CAPSULE | Freq: Four times a day (QID) | ORAL | Status: DC | PRN
  Administered 2020-03-21: 25 mg via ORAL
  Filled 2020-03-21: qty 1

## 2020-03-21 MED ORDER — WHITE PETROLATUM EX OINT
TOPICAL_OINTMENT | CUTANEOUS | Status: AC
  Administered 2020-03-21: 0.2
  Filled 2020-03-21: qty 28.35

## 2020-03-21 NOTE — Progress Notes (Signed)
Patient is resting comfortably on room air with no respiratory distress noted. BIPAP not needed at this time. RT will monitor as needed.

## 2020-03-21 NOTE — Progress Notes (Signed)
NIF -38 FVC 1.5L  Patient performed both with good effort.

## 2020-03-21 NOTE — Progress Notes (Signed)
NIF -60 VC 1.2L  Patient performed both with great effort.

## 2020-03-21 NOTE — Progress Notes (Addendum)
Subjective: Patient in bed, awake, alert, NAD, no family at bedside. Completed dose 5/5 of IVIG overnight with no improvement.  Objective: Current vital signs: BP (!) 147/66 (BP Location: Left Arm)   Pulse (!) 53   Temp 98.5 F (36.9 C) (Oral)   Resp 20   Ht 6' (1.829 m)   Wt 110.3 kg   SpO2 97%   BMI 32.98 kg/m  Vital signs in last 24 hours: Temp:  [97.9 F (36.6 C)-98.9 F (37.2 C)] 98.5 F (36.9 C) (07/03 1100) Pulse Rate:  [53-58] 53 (07/03 1100) Resp:  [16-23] 20 (07/03 1100) BP: (133-158)/(49-110) 147/66 (07/03 1100) SpO2:  [92 %-100 %] 97 % (07/03 1100)  Intake/Output from previous day: 07/02 0701 - 07/03 0700 In: 1120.1 [P.O.:720; IV Piggyback:400.1] Out: 1300 [Urine:1300] Intake/Output this shift: No intake/output data recorded. Nutritional status:  Diet Order            Diet Carb Modified Fluid consistency: Thin; Room service appropriate? Yes  Diet effective now                General: Appears well developed and well nourished. HEENT: [place]/AT Lungs: No tachypnea or accessory muscles of respiration noted.    Neurologic Exam: Mental Status: Alert, oriented, thought content appropriate.  Speech fluent without evidence of aphasia.  Able to follow all commands without difficulty. Cranial Nerves: II:  VFF. PERRL.  III,IV, VI: EOMI V,VII: Smile symmetric, facial temp sensation equal bilaterally VIII: hearing intact to voice IX,X: No hypophonia XI: Neck flexion 5/5.  XII: midline tongue extension  Motor: Neck extension 5/5 RUE: 5/5 deltoid,  5/5biceps, 5/5 triceps and wrist flexion. 4/5 wrist extension. 2/5 grip. 0/5 finger abduction. Can wiggle fingers weakly on right side only.  LUE: 5/5 deltoid, 5/5biceps,5/5 triceps. 4/5 wrist flexion. 4/5 wrist extension. 0/5 grip. 0/5 finger abduction. 0/5 finger extension can not wiggle fingers. BLE: 0/5 proximally and distally.  Sensory: intact to LT throughout Deep Tendon Reflexes:  3+ , biceps bilaterally 4+  patellars bilaterally  With cross adduction 2+ achilles bilaterally Cerebellar: No ataxia noted in upper extremities.  Gait: Unable to assess   Lab Results: Results for orders placed or performed during the hospital encounter of 03/08/20 (from the past 48 hour(s))  Glucose, capillary     Status: Abnormal   Collection Time: 03/19/20  9:39 PM  Result Value Ref Range   Glucose-Capillary 181 (H) 70 - 99 mg/dL    Comment: Glucose reference range applies only to samples taken after fasting for at least 8 hours.  Basic metabolic panel     Status: Abnormal   Collection Time: 03/20/20  2:36 AM  Result Value Ref Range   Sodium 133 (L) 135 - 145 mmol/L   Potassium 4.1 3.5 - 5.1 mmol/L   Chloride 105 98 - 111 mmol/L   CO2 21 (L) 22 - 32 mmol/L   Glucose, Bld 152 (H) 70 - 99 mg/dL    Comment: Glucose reference range applies only to samples taken after fasting for at least 8 hours.   BUN 27 (H) 8 - 23 mg/dL   Creatinine, Ser 0.98 0.61 - 1.24 mg/dL   Calcium 8.4 (L) 8.9 - 10.3 mg/dL   GFR calc non Af Amer >60 >60 mL/min   GFR calc Af Amer >60 >60 mL/min   Anion gap 7 5 - 15    Comment: Performed at [facility] [address], [place], [place] [address]  CBC with Differential/Platelet  Status: Abnormal   Collection Time: 03/20/20  2:36 AM  Result Value Ref Range   WBC 6.9 4.0 - 10.5 K/uL   RBC 4.02 (L) 4.22 - 5.81 MIL/uL   Hemoglobin 11.7 (L) 13.0 - 17.0 g/dL   HCT 36.2 (L) 39 - 52 %   MCV 90.0 80.0 - 100.0 fL   MCH 29.1 26.0 - 34.0 pg   MCHC 32.3 30.0 - 36.0 g/dL   RDW 13.4 11.5 - 15.5 %   Platelets 271 150 - 400 K/uL   nRBC 0.0 0.0 - 0.2 %   Neutrophils Relative % 69 %   Neutro Abs 4.8 1.7 - 7.7 K/uL   Lymphocytes Relative 25 %   Lymphs Abs 1.7 0.7 - 4.0 K/uL   Monocytes Relative 5 %   Monocytes Absolute 0.3 0 - 1 K/uL   Eosinophils Relative 0 %   Eosinophils Absolute 0.0 0 - 0 K/uL   Basophils Relative 0 %   Basophils Absolute 0.0 0 - 0 K/uL   Immature Granulocytes 1  %   Abs Immature Granulocytes 0.05 0.00 - 0.07 K/uL    Comment: Performed at [facility], [address] [place], [place] [address]  Glucose, capillary     Status: Abnormal   Collection Time: 03/20/20  7:22 AM  Result Value Ref Range   Glucose-Capillary 130 (H) 70 - 99 mg/dL    Comment: Glucose reference range applies only to samples taken after fasting for at least 8 hours.  Glucose, capillary     Status: Abnormal   Collection Time: 03/20/20 12:25 PM  Result Value Ref Range   Glucose-Capillary 187 (H) 70 - 99 mg/dL    Comment: Glucose reference range applies only to samples taken after fasting for at least 8 hours.  Glucose, capillary     Status: Abnormal   Collection Time: 03/20/20  3:29 PM  Result Value Ref Range   Glucose-Capillary 217 (H) 70 - 99 mg/dL    Comment: Glucose reference range applies only to samples taken after fasting for at least 8 hours.  Glucose, capillary     Status: Abnormal   Collection Time: 03/20/20  8:50 PM  Result Value Ref Range   Glucose-Capillary 220 (H) 70 - 99 mg/dL    Comment: Glucose reference range applies only to samples taken after fasting for at least 8 hours.  CBC with Differential/Platelet     Status: Abnormal   Collection Time: 03/21/20  3:34 AM  Result Value Ref Range   WBC 7.3 4.0 - 10.5 K/uL   RBC 3.85 (L) 4.22 - 5.81 MIL/uL   Hemoglobin 11.3 (L) 13.0 - 17.0 g/dL   HCT 35.1 (L) 39 - 52 %   MCV 91.2 80.0 - 100.0 fL   MCH 29.4 26.0 - 34.0 pg   MCHC 32.2 30.0 - 36.0 g/dL   RDW 13.5 11.5 - 15.5 %   Platelets 288 150 - 400 K/uL   nRBC 0.0 0.0 - 0.2 %   Neutrophils Relative % 72 %   Neutro Abs 5.2 1.7 - 7.7 K/uL   Lymphocytes Relative 22 %   Lymphs Abs 1.6 0.7 - 4.0 K/uL   Monocytes Relative 4 %   Monocytes Absolute 0.3 0 - 1 K/uL   Eosinophils Relative 1 %   Eosinophils Absolute 0.1 0 - 0 K/uL   Basophils Relative 0 %   Basophils Absolute 0.0 0 - 0 K/uL   Immature Granulocytes 1 %   Abs Immature Granulocytes  0.09 (H) 0.00  - 0.07 K/uL    Comment: Performed at [facility], [place] [address], [place], [place] [address]  Glucose, capillary     Status: Abnormal   Collection Time: 03/21/20  7:30 AM  Result Value Ref Range   Glucose-Capillary 165 (H) 70 - 99 mg/dL    Comment: Glucose reference range applies only to samples taken after fasting for at least 8 hours.  Glucose, capillary     Status: Abnormal   Collection Time: 03/21/20 12:06 PM  Result Value Ref Range   Glucose-Capillary 167 (H) 70 - 99 mg/dL    Comment: Glucose reference range applies only to samples taken after fasting for at least 8 hours.   Comment 1 Notify RN    Comment 2 Document in Chart     Recent Results (from the past 240 hour(s))  MRSA PCR Screening     Status: None   Collection Time: 03/16/20 11:49 AM   Specimen: Nasal Mucosa; Nasopharyngeal  Result Value Ref Range Status   MRSA by PCR NEGATIVE NEGATIVE Final    Comment:        The GeneXpert MRSA Assay (FDA approved for NASAL specimens only), is one component of a comprehensive MRSA colonization surveillance program. It is not intended to diagnose MRSA infection nor to guide or monitor treatment for MRSA infections. Performed at [facility], [place] [address], [place], [place] [address]     Lipid Panel No results for input(s): CHOL, TRIG, HDL, CHOLHDL, VLDL, LDLCALC in the last 72 hours.  Studies/Results: No results found.  Medications:  Scheduled: . aspirin EC  81 mg Oral QHS  . chlorhexidine  15 mL Mouth Rinse BID  . Chlorhexidine Gluconate Cloth  6 each Topical Q0600  . docusate sodium  100 mg Oral BID  . enoxaparin (LOVENOX) injection  50 mg Subcutaneous Q24H  . fenofibrate  160 mg Oral QHS  . fluticasone  2 spray Each Nare QHS  . icosapent Ethyl  2 g Oral BID  . insulin aspart  0-20 Units Subcutaneous TID WC  . insulin aspart  0-5 Units Subcutaneous QHS  . insulin glargine  30 Units Subcutaneous Daily  . levofloxacin  750 mg Oral Daily  . lidocaine   1 patch Transdermal Q24H  . mouth rinse  15 mL Mouth Rinse q12n4p  . multivitamin with minerals  1 tablet Oral Daily  . nystatin  1 application Topical TID  . oxybutynin  5 mg Oral BID  . pantoprazole  40 mg Oral Daily  . polyethylene glycol  17 g Oral Daily  . rosuvastatin  40 mg Oral QHS  . sertraline  150 mg Oral QHS  . vitamin B-12  500 mcg Oral Daily  . zolpidem  5 mg Oral QHS   Continuous: . sodium chloride Stopped (03/17/20 0902)    Laurey Morale, MSN, NP-C [facility] [phone]   Assessment: 74 year old male with a past medical history significant for coronary artery disease, diabetes mellitus, hypertension, hyperlipidemia, obstructive sleep apnea and obesity who presented to the ED on 6/20 with initial chief complaint of acute chest pain, diaphoresis and sharp back pain. MI was ruled out and CTA was negative for dissection. Later on the same day, the patient experienced abrupt onset of RLE flaccid weakness with preservation of sensation. The weakness spread to the LLE on 6/21 and distal bilateral upper extremity weakness was noted shortly afterwards. He had a sensory level at T6-T9. STAT MRI of C,  T and L spinerevealed abnormalsignal in the anterior cervical andthoracic spinal cord with grey matter being primarily affected, appearing most consistent with an anterior spinal cord infarction.Demyelinating disease and transverse myelitis were also on the Radiological DDx. The patient was treated with 5 days of IV methylprednisolone without improvement. He subsequently was started on a 5 day course of IVIG, which was completed yesterday (7/2).  1. Contrast-enhanced cervical and thoracic spine MRI performed on 6/28 revealed progressive cord edema extending from C4-5 to T4-5 with mild patchy enhancement. Transverse myelitis and cord infarct were the primary considerations, with progression favoring myelitis, per Radiology. However, the abrupt onset on 6/20 was more  suggestive of a spinal cord infarction. 2. Underwent LP under fluoroscopy on 6/22 with the following results: CSF cell count 1, Protein 53, IgG index: normal 3. Other labs included the following: B12 714, Ceruloplasmin normal, Folate normal, A1c 6.7, SSA and SSB: Negative, ACE: Negative, ANA negative, vitamin B1 normal 4.  NMO came back positive at 6.5. RF: 19.1, serumMOG antibody negative. Pending labs: Serum paraneoplastic panel 5. MRI brain was negative for acute infarction or demyelination.  6. On exam today following a full course of IVIG treatment, the patient shows no improvement.  .   Recommendations: - PLEX is not a good option due to the risk for hypotension, which could worsen his condition if his cord lesion is due to a spinal cord infarction.  -Aggressive PT/OT -Continue aspirin 81 mg daily  -Consider starting atorvastatin20 mg daily -Protonix 40 mg daily -NIFevery 12 hours -Frequent neuro checks  Electronically signed: Dr. Kerney Elbe

## 2020-03-21 NOTE — Progress Notes (Signed)
PROGRESS NOTE    Brent [name].  EXH:[id number] DOB: 1946/06/27 DOA: 03/08/2020 PCP: [name], [name], MD   Brief Narrative:  Patient is a 74 year old male with history of coronary artery disease status post stenting, IDDM, diabetic neuropathy, sciatica, hypertension, hyperlipidemia who presented with chest pain.  Troponins were negative.  CT dissection study was negative.  Cardiology was consulted.  He underwent nuclear stress test which was unremarkable.  He then he started having weakness on left lower extremity as well as decreased ability with strength on bilateral hands.  Neurology consulted.  MRI spine showed subtle signal changes in cervical, thoracic spine.  T-spine MRI showed 6/10 mm enhancing lesion in the left T9 vertebral body.  LP was unremarkable.  Initial working diagnosis is transverse myelitis as per neurology but work up was not 3 conclusive.  Patient was started on high-dose Solu-Medrol and it was given for 5 days.   MRI C-spine and T-spine were repeated by neurology in 03/08/2020 which showed progressive cord edema extending from C4-5 to T4-5 favoring myelitis.  He was also started on IVIG.  Patient suddenly developed acute shortness of breath/respiratory/on evening of 03/15/2020 and was moved to ICU.  Chest x-ray showed multifocal pneumonia.  Currently respiratory status is stable. Patient seen by PT/OT and recommended CIR.  Now waiting for CIR bed.  Assessment & Plan:   Active Problems:   Chest pain   Weakness   Quadriplegia (HCC)   Coronary artery disease involving native coronary artery of native heart without angina pectoris   Steroid-induced hyperglycemia   Diabetic peripheral neuropathy (HCC)   Tachypnea   Hyponatremia   AKI (acute kidney injury) ([facility])   Acute hypoxemic respiratory failure (HCC)   Acute on chronic respiratory failure with hypoxia (HCC)   Transverse myelitis/cord infarction: Started with abrupt onset of right lower extremity  flaccid weakness with preservation of sensation.  The weakness spread to the left lower extremity on 6/21 and distal bilateral upper extremity weakness was noted shortly afterwards.  MRI revealed abnormal signal in the anterior cervical and thoracic spinal cord with gray matter being primarily affected, consistent with anterior spinal cord infarction.  Demyelinating disease and transverse myelitis were on the differentials.  Patient was treated with 5 days of IV methylprednisolone with improvement.  Completed 5 days course of IVIG.  LP was done and CSF cell count, protein, IgG index were normal.  Normal vitamin B12, ceruloplasmin was normal, folate was normal.  Negative SSA/SSB, ACE, ANA, normal vitamin B1.  And MO came out to be positive at 6.5.  MRI brain was negative for acute infarction or demyelination. Patient continues to have bilateral lower extremity severe weakness and weak handgrip.  Continue aspirin, Lipitor. Echocardiogram did not show any PFO, normal ejection fraction.  Outpatient loop monitoring recommended. PT/OT evaluation done and recommended CIR.  Acute hypoxic respiratory failure: Aspiration pneumonia suspected.  He had deveopled  acute respite distress and was transferred to ICU.  Chest x-ray showed multifocal pneumonia .Currently respiratory status is stable.  Lungs are clear.  Currently on room air.  Antibiotics will be changed to oral to finish 5 days course.  Chest pain: Initially presented with chest pain.  Troponins were negative.  Currently chest pain-free.  Nuclear stress test negative for reversible ischemia.  Insulin-dependent diabetes type 2: Monitor blood sugars.  Continue Lantus, sliding scale  Hypertension: Continue home medications.  Blood pressure currently stable  Hyperlipidemia: On Lipitor  History of malignant melanoma: Currently in remission.  Being  follow[facility] at [facility]  AKI: Resolved with IV fluids  Constipation: Continue bowel regimen  Obesity: BMI of  32.9          DVT prophylaxis:SCD Code Status: Full Family Communication: Wife on phone Status is: Inpatient  Remains inpatient appropriate because:Unsafe d/c plan   Dispo:  Patient From: Home  Planned Disposition: Inpatient Rehab  Expected discharge date: 2-3 days  Medically stable for discharge: Yes      Consultants: Neurology, PCCM  Procedures: LP  Antimicrobials:  Anti-infectives (From admission, onward)   Start     Dose/Rate Route Frequency Ordered Stop   03/18/20 1000  ceFEPIme (MAXIPIME) 2 g in sodium chloride 0.9 % 100 mL IVPB     Discontinue     2 g 200 mL/hr over 30 Minutes Intravenous Every 8 hours 03/18/20 0849 03/22/20 2359      Subjective:  Patient seen and examined at the bedside this morning.  Hemodynamically stable.  Comfortable.  Still has severe weakness of bilateral lower extremities and hand grip bilaterally.  He was talking on phone to his wife.  Denies any new complaints. Objective: Vitals:   03/21/20 0500 03/21/20 0600 03/21/20 0658 03/21/20 1100  BP: (!) 133/49 (!) 145/51 (!) 146/55 (!) 147/66  Pulse: (!) 55 (!) 55 (!) 54 (!) 53  Resp: 18 19 18 20   Temp:   97.9 F (36.6 C) 98.5 F (36.9 C)  TempSrc:   Oral Oral  SpO2: 94% 95% 100% 97%  Weight:      Height:        Intake/Output Summary (Last 24 hours) at 03/21/2020 1109 Last data filed at 03/21/2020 0400 Gross per 24 hour  Intake 1120.06 ml  Output 900 ml  Net 220.06 ml   Filed Weights   03/08/20 2044 03/12/20 0619 03/15/20 1905  Weight: 98.4 kg 102.7 kg 110.3 kg    Examination:  General exam: Appears calm and comfortable ,Not in distress,obese HEENT:PERRL,Oral mucosa moist, Ear/Nose normal on gross exam Respiratory system: Bilateral equal air entry, normal vesicular breath sounds, no wheezes or crackles  Cardiovascular system: S1 & S2 heard, RRR. No JVD, murmurs, rubs, gallops or clicks. No pedal edema. Gastrointestinal system: Abdomen is distended, soft and nontender. No  organomegaly or masses felt. Normal bowel sounds heard. Central nervous system: Alert and oriented.  Severe weakness of the bilateral lower extremities, bilateral hand grip. Extremities: No edema, no clubbing ,no cyanosi Skin: No rashes, lesions or ulcers,no icterus ,no pallor    Data Reviewed: I have personally reviewed following labs and imaging studies  CBC: Recent Labs  Lab 03/16/20 0215 03/16/20 0215 03/17/20 0216 03/18/20 0324 03/19/20 0340 03/20/20 0236 03/21/20 0334  WBC 9.5   < > 7.5 7.4 6.3 6.9 7.3  NEUTROABS 7.0  --  5.5  --  4.4 4.8 5.2  HGB 13.5   < > 12.1* 11.8* 11.4* 11.7* 11.3*  HCT 41.5   < > 37.6* 37.4* 35.2* 36.2* 35.1*  MCV 90.0   < > 90.4 90.6 89.6 90.0 91.2  PLT 210   < > 203 227 235 271 288   < > = values in this interval not displayed.   Basic Metabolic Panel: Recent Labs  Lab 03/16/20 0215 03/17/20 0216 03/18/20 0324 03/19/20 0340 03/20/20 0236  NA 139 136 134* 133* 133*  K 4.3 4.0 4.1 4.1 4.1  CL 106 104 104 104 105  CO2 23 24 22 22  21*  GLUCOSE 66* 98 175* 171* 152*  BUN 33* 33*  26* 26* 27*  CREATININE 0.94 0.97 0.87 1.01 0.98  CALCIUM 8.7* 8.3* 8.3* 8.2* 8.4*  MG 2.2  --   --   --   --   PHOS 4.0  --   --   --   --    GFR: Estimated Creatinine Clearance: 83.6 mL/min (by C-G formula based on SCr of 0.98 mg/dL). Liver Function Tests: Recent Labs  Lab 03/18/20 0324  AST 74*  ALT 66*  ALKPHOS 76  BILITOT 0.7  PROT 6.8  ALBUMIN 2.0*   No results for input(s): LIPASE, AMYLASE in the last 168 hours. No results for input(s): AMMONIA in the last 168 hours. Coagulation Profile: No results for input(s): INR, PROTIME in the last 168 hours. Cardiac Enzymes: Recent Labs  Lab 03/15/20 1921  CKTOTAL 119   BNP (last 3 results) No results for input(s): PROBNP in the last 8760 hours. HbA1C: No results for input(s): HGBA1C in the last 72 hours. CBG: Recent Labs  Lab 03/20/20 0722 03/20/20 1225 03/20/20 1529 03/20/20 2050  03/21/20 0730  GLUCAP 130* 187* 217* 220* 165*   Lipid Profile: No results for input(s): CHOL, HDL, LDLCALC, TRIG, CHOLHDL, LDLDIRECT in the last 72 hours. Thyroid Function Tests: No results for input(s): TSH, T4TOTAL, FREET4, T3FREE, THYROIDAB in the last 72 hours. Anemia Panel: No results for input(s): VITAMINB12, FOLATE, FERRITIN, TIBC, IRON, RETICCTPCT in the last 72 hours. Sepsis Labs: Recent Labs  Lab 03/15/20 1921 03/16/20 0215 03/17/20 0216 03/18/20 0324  PROCALCITON <0.10 0.16 0.33 0.24  LATICACIDVEN 0.9  --   --   --     Recent Results (from the past 240 hour(s))  MRSA PCR Screening     Status: None   Collection Time: 03/16/20 11:49 AM   Specimen: Nasal Mucosa; Nasopharyngeal  Result Value Ref Range Status   MRSA by PCR NEGATIVE NEGATIVE Final    Comment:        The GeneXpert MRSA Assay (FDA approved for NASAL specimens only), is one component of a comprehensive MRSA colonization surveillance program. It is not intended to diagnose MRSA infection nor to guide or monitor treatment for MRSA infections. Performed at [facility], [place] [address], [place], [place] [address]          Radiology Studies: No results found.      Scheduled Meds: . aspirin EC  81 mg Oral QHS  . chlorhexidine  15 mL Mouth Rinse BID  . Chlorhexidine Gluconate Cloth  6 each Topical Q0600  . docusate sodium  100 mg Oral BID  . enoxaparin (LOVENOX) injection  50 mg Subcutaneous Q24H  . fenofibrate  160 mg Oral QHS  . fluticasone  2 spray Each Nare QHS  . icosapent Ethyl  2 g Oral BID  . insulin aspart  0-20 Units Subcutaneous TID WC  . insulin aspart  0-5 Units Subcutaneous QHS  . insulin glargine  30 Units Subcutaneous Daily  . lidocaine  1 patch Transdermal Q24H  . mouth rinse  15 mL Mouth Rinse q12n4p  . multivitamin with minerals  1 tablet Oral Daily  . nystatin  1 application Topical TID  . oxybutynin  5 mg Oral BID  . pantoprazole  40 mg Oral Daily  .  polyethylene glycol  17 g Oral Daily  . rosuvastatin  40 mg Oral QHS  . sertraline  150 mg Oral QHS  . vitamin B-12  500 mcg Oral Daily  . zolpidem  5 mg Oral QHS   Continuous  Infusions: . sodium chloride Stopped (03/17/20 0902)  . ceFEPime (MAXIPIME) IV 2 g (03/21/20 1007)     LOS: 12 days    Time spent:25 mins. More than 50% of that time was spent in counseling and/or coordination of care.      Shelly Coss, MD [facility] P7/11/2019, 11:09 AM

## 2020-03-22 LAB — GLUCOSE, CAPILLARY
Glucose-Capillary: 152 mg/dL — ABNORMAL HIGH (ref 70–99)
Glucose-Capillary: 146 mg/dL — ABNORMAL HIGH (ref 70–99)
Glucose-Capillary: 167 mg/dL — ABNORMAL HIGH (ref 70–99)
Glucose-Capillary: 197 mg/dL — ABNORMAL HIGH (ref 70–99)

## 2020-03-22 MED ORDER — ATORVASTATIN CALCIUM 10 MG PO TABS
20.0000 mg | ORAL_TABLET | Freq: Every day | ORAL | Status: DC
  Administered 2020-03-22 – 2020-03-25 (×4): 20 mg via ORAL
  Filled 2020-03-22 (×4): qty 2

## 2020-03-22 MED ORDER — SODIUM CHLORIDE 0.9 % IV SOLN
2.0000 g | Freq: Three times a day (TID) | INTRAVENOUS | Status: DC
  Administered 2020-03-22 – 2020-03-23 (×5): 2 g via INTRAVENOUS
  Filled 2020-03-22 (×5): qty 2

## 2020-03-22 NOTE — Progress Notes (Signed)
NIF -60 VC 1.5 L Great patient effort

## 2020-03-22 NOTE — Progress Notes (Signed)
   03/22/20 1946  Assess: MEWS Score  Temp 98.6 F (37 C)  BP (!) 143/60  ECG Heart Rate (!) 50  Resp (!) 24  SpO2 100 %  O2 Device Room Air  Assess: MEWS Score  MEWS Temp 0  MEWS Systolic 0  MEWS Pulse 1  MEWS RR 1  MEWS LOC 0  MEWS Score 2  MEWS Score Color Yellow  Assess: if the MEWS score is Yellow or Red  Were vital signs taken at a resting state? Yes  Focused Assessment Documented focused assessment  Early Detection of Sepsis Score *See Row Information* Low  MEWS guidelines implemented *See Row Information* Yes  Treat  MEWS Interventions Other (Comment) (RR recheched 19, Pulse 52 (is always low, MD aware). )  Take Vital Signs  Increase Vital Sign Frequency  Yellow: Q 2hr X 2 then Q 4hr X 2, if remains yellow, continue Q 4hrs  Escalate  MEWS: Escalate Yellow: discuss with charge nurse/RN and consider discussing with provider and RRT  Notify: Charge Nurse/RN  Name of Charge Nurse/RN Notified [place] RN  Date Charge Nurse/RN Notified 03/22/20  Time Charge Nurse/RN Notified 1946

## 2020-03-22 NOTE — Progress Notes (Signed)
NIF -60 VC 1.2 L Both with great effort.

## 2020-03-22 NOTE — Progress Notes (Signed)
PROGRESS NOTE    Brent [name].  GQB:[id number] DOB: March 25, 1946 DOA: 03/08/2020 PCP: [name], [name], MD   Brief Narrative:  Patient is a 74 year old male with history of coronary artery disease status post stenting, IDDM, diabetic neuropathy, sciatica, hypertension, hyperlipidemia who presented with chest pain.  Troponins were negative.  CT dissection study was negative.  Cardiology was consulted.  He underwent nuclear stress test which was unremarkable.  He then he started having weakness on left lower extremity as well as decreased ability with strength on bilateral hands.  Neurology consulted.  MRI spine showed subtle signal changes in cervical, thoracic spine.  T-spine MRI showed 6/10 mm enhancing lesion in the left T9 vertebral body.  LP was unremarkable.  Initial working diagnosis is transverse myelitis as per neurology but work up was not 3 conclusive.  Patient was started on high-dose Solu-Medrol and it was given for 5 days.   MRI C-spine and T-spine were repeated by neurology in 03/08/2020 which showed progressive cord edema extending from C4-5 to T4-5 favoring myelitis.  He was also started on IVIG and he completed the course without improvement. Patient suddenly developed acute shortness of breath/respiratory/on evening of 03/15/2020 and was moved to ICU.  Chest x-ray showed multifocal pneumonia.  Currently respiratory status is stable. Patient seen by PT/OT and recommended CIR.  Now waiting for CIR bed.  Assessment & Plan:   Active Problems:   Chest pain   Weakness   Quadriplegia (HCC)   Coronary artery disease involving native coronary artery of native heart without angina pectoris   Steroid-induced hyperglycemia   Diabetic peripheral neuropathy (HCC)   Tachypnea   Hyponatremia   AKI (acute kidney injury) ([place])   Acute hypoxemic respiratory failure (HCC)   Acute on chronic respiratory failure with hypoxia (HCC)   Transverse myelitis/cord infarction: Started  with abrupt onset of right lower extremity flaccid weakness with preservation of sensation.  The weakness spread to the left lower extremity on 6/21 and distal bilateral upper extremity weakness was noted shortly afterwards.  MRI revealed abnormal signal in the anterior cervical and thoracic spinal cord with gray matter being primarily affected, consistent with anterior spinal cord infarction.  Demyelinating disease and transverse myelitis were on the differentials.  Patient was treated with 5 days of IV methylprednisolone with improvement.  Completed 5 days course of IVIG.  LP was done and CSF cell count, protein, IgG index were normal.  Normal vitamin B12, ceruloplasmin was normal, folate was normal.  Negative SSA/SSB, ACE, ANA, normal vitamin B1.  And MO came out to be positive at 6.5.  MRI brain was negative for acute infarction or demyelination. Patient continues to have bilateral lower extremity severe weakness and weak handgrip.  Continue aspirin, Lipitor. Echocardiogram did not show any PFO, normal ejection fraction.  Outpatient loop monitoring recommended. PT/OT evaluation done and recommended CIR.  Acute hypoxic respiratory failure: Aspiration pneumonia suspected.  He had deveopled  acute respite distress and was transferred to ICU.  Chest x-ray showed multifocal pneumonia .Currently respiratory status is stable.  Lungs are clear.  Currently on room air.  Continue cefepime to complete the course of 7 days.  Chest pain: Initially presented with chest pain.  Troponins were negative.  Currently chest pain-free.  Nuclear stress test negative for reversible ischemia.  Insulin-dependent diabetes type 2: Monitor blood sugars.  Continue Lantus, sliding scale  Hypertension: Continue home medications.  Blood pressure currently stable  Hyperlipidemia: On Lipitor  History of malignant melanoma: Currently  in remission.  Being [facility]  AKI: Resolved with IV fluids  Constipation: Continue bowel  regimen  Obesity: BMI of 32.9          DVT prophylaxis:SCD Code Status: Full Family Communication: Wife on phone on 03/22/20 Status is: Inpatient  Remains inpatient appropriate because:Unsafe d/c plan   Dispo:  Patient From: Home  Planned Disposition: Inpatient Rehab  Expected discharge date: 2-3 days  Medically stable for discharge: Yes      Consultants: Neurology, PCCM  Procedures: LP  Antimicrobials:  Anti-infectives (From admission, onward)   Start     Dose/Rate Route Frequency Ordered Stop   03/22/20 1000  ceFEPIme (MAXIPIME) 2 g in sodium chloride 0.9 % 100 mL IVPB     Discontinue     2 g 200 mL/hr over 30 Minutes Intravenous Every 8 hours 03/22/20 0954 03/24/20 0959   03/21/20 1800  levofloxacin (LEVAQUIN) tablet 750 mg  Status:  Discontinued        750 mg Oral Daily 03/21/20 1112 03/22/20 0954   03/18/20 1000  ceFEPIme (MAXIPIME) 2 g in sodium chloride 0.9 % 100 mL IVPB  Status:  Discontinued        2 g 200 mL/hr over 30 Minutes Intravenous Every 8 hours 03/18/20 0849 03/21/20 1112      Subjective:  Patient seen and examined at the bedside this morning.  Hemodynamically stable.  He developed some rash on his back after he was given levofloxacin.  The rash is not worsening.  Levofloxacin is stopped.  No new complaints today.  Weakness same as yesterday.   Objective: Vitals:   03/21/20 2054 03/22/20 0005 03/22/20 0416 03/22/20 1151  BP:  (!) 115/57 (!) 143/67 (!) 154/53  Pulse:    (!) 48  Resp:    18  Temp: 98.3 F (36.8 C) 98.3 F (36.8 C) 98.3 F (36.8 C) 98.2 F (36.8 C)  TempSrc: Oral Oral Oral Oral  SpO2:    98%  Weight:   107.3 kg   Height:        Intake/Output Summary (Last 24 hours) at 03/22/2020 1154 Last data filed at 03/22/2020 0541 Gross per 24 hour  Intake 360 ml  Output 2100 ml  Net -1740 ml   Filed Weights   03/12/20 0619 03/15/20 1905 03/22/20 0416  Weight: 102.7 kg 110.3 kg 107.3 kg    Examination:   General exam:  Appears calm and comfortable ,Not in distress,obese HEENT:PERRL,Oral mucosa moist, Ear/Nose normal on gross exam Respiratory system: Bilateral equal air entry, normal vesicular breath sounds, no wheezes or crackles  Cardiovascular system: S1 & S2 heard, RRR. No JVD, murmurs, rubs, gallops or clicks. Gastrointestinal system: Abdomen is nondistended, soft and nontender. No organomegaly or masses felt. Normal bowel sounds heard. Central nervous system: Alert and oriented. Severe weakness of the bilateral lower extremities, bilateral hand grip. Extremities: No edema, no clubbing ,no cyanosis, distal peripheral pulses palpable. Skin: Macular rash on the back     Data Reviewed: I have personally reviewed following labs and imaging studies  CBC: Recent Labs  Lab 03/16/20 0215 03/16/20 0215 03/17/20 0216 03/18/20 0324 03/19/20 0340 03/20/20 0236 03/21/20 0334  WBC 9.5   < > 7.5 7.4 6.3 6.9 7.3  NEUTROABS 7.0  --  5.5  --  4.4 4.8 5.2  HGB 13.5   < > 12.1* 11.8* 11.4* 11.7* 11.3*  HCT 41.5   < > 37.6* 37.4* 35.2* 36.2* 35.1*  MCV 90.0   < >  90.4 90.6 89.6 90.0 91.2  PLT 210   < > 203 227 235 271 288   < > = values in this interval not displayed.   Basic Metabolic Panel: Recent Labs  Lab 03/16/20 0215 03/17/20 0216 03/18/20 0324 03/19/20 0340 03/20/20 0236  NA 139 136 134* 133* 133*  K 4.3 4.0 4.1 4.1 4.1  CL 106 104 104 104 105  CO2 23 24 22 22  21*  GLUCOSE 66* 98 175* 171* 152*  BUN 33* 33* 26* 26* 27*  CREATININE 0.94 0.97 0.87 1.01 0.98  CALCIUM 8.7* 8.3* 8.3* 8.2* 8.4*  MG 2.2  --   --   --   --   PHOS 4.0  --   --   --   --    GFR: Estimated Creatinine Clearance: 82.4 mL/min (by C-G formula based on SCr of 0.98 mg/dL). Liver Function Tests: Recent Labs  Lab 03/18/20 0324  AST 74*  ALT 66*  ALKPHOS 76  BILITOT 0.7  PROT 6.8  ALBUMIN 2.0*   No results for input(s): LIPASE, AMYLASE in the last 168 hours. No results for input(s): AMMONIA in the last 168  hours. Coagulation Profile: No results for input(s): INR, PROTIME in the last 168 hours. Cardiac Enzymes: Recent Labs  Lab 03/15/20 1921  CKTOTAL 119   BNP (last 3 results) No results for input(s): PROBNP in the last 8760 hours. HbA1C: No results for input(s): HGBA1C in the last 72 hours. CBG: Recent Labs  Lab 03/21/20 0730 03/21/20 1206 03/21/20 1706 03/21/20 2053 03/22/20 0757  GLUCAP 165* 167* 181* 177* 152*   Lipid Profile: No results for input(s): CHOL, HDL, LDLCALC, TRIG, CHOLHDL, LDLDIRECT in the last 72 hours. Thyroid Function Tests: No results for input(s): TSH, T4TOTAL, FREET4, T3FREE, THYROIDAB in the last 72 hours. Anemia Panel: No results for input(s): VITAMINB12, FOLATE, FERRITIN, TIBC, IRON, RETICCTPCT in the last 72 hours. Sepsis Labs: Recent Labs  Lab 03/15/20 1921 03/16/20 0215 03/17/20 0216 03/18/20 0324  PROCALCITON <0.10 0.16 0.33 0.24  LATICACIDVEN 0.9  --   --   --     Recent Results (from the past 240 hour(s))  MRSA PCR Screening     Status: None   Collection Time: 03/16/20 11:49 AM   Specimen: Nasal Mucosa; Nasopharyngeal  Result Value Ref Range Status   MRSA by PCR NEGATIVE NEGATIVE Final    Comment:        The GeneXpert MRSA Assay (FDA approved for NASAL specimens only), is one component of a comprehensive MRSA colonization surveillance program. It is not intended to diagnose MRSA infection nor to guide or monitor treatment for MRSA infections. Performed at [facility], [place] [address], [place],  [address]          Radiology Studies: No results found.      Scheduled Meds: . aspirin EC  81 mg Oral QHS  . atorvastatin  20 mg Oral Daily  . chlorhexidine  15 mL Mouth Rinse BID  . Chlorhexidine Gluconate Cloth  6 each Topical Q0600  . docusate sodium  100 mg Oral BID  . enoxaparin (LOVENOX) injection  50 mg Subcutaneous Q24H  . fenofibrate  160 mg Oral QHS  . fluticasone  2 spray Each Nare QHS  .  icosapent Ethyl  2 g Oral BID  . insulin aspart  0-20 Units Subcutaneous TID WC  . insulin aspart  0-5 Units Subcutaneous QHS  . insulin glargine  30 Units Subcutaneous Daily  . lidocaine  1 patch Transdermal Q24H  . mouth rinse  15 mL Mouth Rinse q12n4p  . multivitamin with minerals  1 tablet Oral Daily  . nystatin  1 application Topical TID  . oxybutynin  5 mg Oral BID  . pantoprazole  40 mg Oral Daily  . polyethylene glycol  17 g Oral Daily  . sertraline  150 mg Oral QHS  . vitamin B-12  500 mcg Oral Daily  . zolpidem  5 mg Oral QHS   Continuous Infusions: . sodium chloride Stopped (03/17/20 0902)  . ceFEPime (MAXIPIME) IV       LOS: 13 days    Time spent:25 mins. More than 50% of that time was spent in counseling and/or coordination of care.      Shelly Coss, MD [facility] P7/12/2019, 11:54 AM

## 2020-03-23 DIAGNOSIS — G360 Neuromyelitis optica [Devic]: Principal | ICD-10-CM

## 2020-03-23 LAB — GLUCOSE, CAPILLARY
Glucose-Capillary: 175 mg/dL — ABNORMAL HIGH (ref 70–99)
Glucose-Capillary: 202 mg/dL — ABNORMAL HIGH (ref 70–99)
Glucose-Capillary: 232 mg/dL — ABNORMAL HIGH (ref 70–99)

## 2020-03-23 MED ORDER — ALBUTEROL SULFATE (2.5 MG/3ML) 0.083% IN NEBU: 2.5000 mg | INHALATION_SOLUTION | Freq: Once | RESPIRATORY_TRACT | Status: DC | PRN

## 2020-03-23 MED ORDER — SODIUM CHLORIDE 0.9 % IV SOLN
1000.0000 mg | Freq: Once | INTRAVENOUS | Status: DC
  Filled 2020-03-23: qty 100

## 2020-03-23 MED ORDER — SODIUM CHLORIDE 0.9 % IV SOLN
1000.0000 mg | Freq: Once | INTRAVENOUS | Status: AC
  Administered 2020-03-24: 1000 mg via INTRAVENOUS
  Filled 2020-03-23: qty 100

## 2020-03-23 MED ORDER — LISINOPRIL 20 MG PO TABS
20.0000 mg | ORAL_TABLET | Freq: Every day | ORAL | Status: DC
  Administered 2020-03-23 – 2020-03-25 (×3): 20 mg via ORAL
  Filled 2020-03-23 (×3): qty 1

## 2020-03-23 MED ORDER — FAMOTIDINE IN NACL 20-0.9 MG/50ML-% IV SOLN
20.0000 mg | Freq: Once | INTRAVENOUS | Status: DC | PRN
  Filled 2020-03-23: qty 50

## 2020-03-23 MED ORDER — DIPHENHYDRAMINE HCL 25 MG PO CAPS
50.0000 mg | ORAL_CAPSULE | Freq: Once | ORAL | Status: AC
  Administered 2020-03-24: 50 mg via ORAL
  Filled 2020-03-23: qty 2

## 2020-03-23 MED ORDER — SODIUM CHLORIDE 0.9 % IV SOLN: 1000.0000 mg | Freq: Once | INTRAVENOUS | Status: DC

## 2020-03-23 MED ORDER — METHYLPREDNISOLONE SODIUM SUCC 125 MG IJ SOLR: 125.0000 mg | Freq: Once | INTRAMUSCULAR | Status: DC | PRN

## 2020-03-23 MED ORDER — EPINEPHRINE PF 1 MG/ML IJ SOLN: 0.3000 mg | INTRAMUSCULAR | Status: DC | PRN

## 2020-03-23 MED ORDER — DIPHENHYDRAMINE HCL 50 MG/ML IJ SOLN: 50.0000 mg | Freq: Once | INTRAMUSCULAR | Status: DC | PRN

## 2020-03-23 MED ORDER — ACETAMINOPHEN 325 MG PO TABS: 650.0000 mg | ORAL_TABLET | Freq: Once | ORAL | Status: DC

## 2020-03-23 MED ORDER — SODIUM CHLORIDE 0.9 % IV BOLUS: 1000.0000 mL | Freq: Once | INTRAVENOUS | Status: DC | PRN

## 2020-03-23 NOTE — Progress Notes (Signed)
Inpatient Rehabilitation-Admissions Coordinator   I continue to follow for possible admit. Unfortunately, I do not have a bed available for this patient today. Will follow up tomorrow.   Raechel Ache, OTR/L  Rehab Admissions Coordinator  [phone] 03/23/2020 9:47 AM

## 2020-03-23 NOTE — Progress Notes (Addendum)
Occupational Therapy Treatment Patient Details Name: Brent [name]. MRN: [id number] DOB: 1946/07/01 Today's Date: 03/23/2020    History of present illness Pt adm 6/20 with chest pain and developed RLE weakness in ED. Pt for nuclear stress test on 6/21 where his weakness progressed to BLE's and Bil hands. Neuro consulted and work up in progress. PMH - CAD, DM, peripheral neuropathy, HTN, MI, obesity   OT comments  Pt feeding self 50% of meal with adapted/built up fork. Pt able to use LUE to feed self bagel after butter applied. Pt required coffee to be poured into styrofoam cup. Pt seen for 2nd session to administer 2nd red tubing for self feeding and for NT to be present to encourage self feeding/rdrinking. Pt would benefit from continued OT skilled services. OT following acutely.    Follow Up Recommendations  CIR    Equipment Recommendations  Wheelchair (measurements OT);Wheelchair cushion (measurements OT)    Recommendations for Other Services      Precautions / Restrictions Precautions Precautions: Fall Precaution Comments: sensation around T6-9 Restrictions Weight Bearing Restrictions: No       Mobility Bed Mobility Overal bed mobility: Needs Assistance Bed Mobility: Rolling;Sidelying to Sit Rolling: Mod assist Sidelying to sit: Total assist;+2 for safety/equipment   Sit to supine: +2 for physical assistance;Total assist   General bed mobility comments: in good supported sitting position for feeding task in b ed  Transfers                 General transfer comment: unable to attempt today due to increased fatigue and incontinence of stool     Balance Overall balance assessment: Needs assistance Sitting-balance support: Single extremity supported;Bilateral upper extremity supported Sitting balance-Leahy Scale: Poor Sitting balance - Comments: worked on sitting propped back on bil UEs with wrist and fingers extended, pt able to hold sitting independently  for 90 sec                                    ADL either performed or assessed with clinical judgement   ADL Overall ADL's : Needs assistance/impaired Eating/Feeding: Set up;Bed level;With adaptive utensils Eating/Feeding Details (indicate cue type and reason): Pt feeding self 50% of meal with adapted/built up fork. Pt able to use LUE to feed self bagel after butter applied. Pt required coffee to be poured into styrofoam cup. Pt seen for 2nd session to administer 2nd red tubing for self feeding and for NT to be present to encourage self feeding/rdrinking.                                   General ADL Comments: Pt session focused on self feeding and NT to observe to encourage self feed     Vision       Perception     Praxis      Cognition Arousal/Alertness: Awake/alert Behavior During Therapy: WFL for tasks assessed/performed Overall Cognitive Status: Within Functional Limits for tasks assessed                                 General Comments: pt continues to be optimistic and agreeable to therapy         Exercises Exercises: Other exercises Other Exercises Other Exercises: static sitting for 90 seconds, pt able  to tolerate with extended wrists and fingers    Shoulder Instructions       General Comments VSS. Pt appears very optimistic about his condition and hoping to go to CIR soon.    Pertinent Vitals/ Pain       Pain Assessment: No/denies pain Faces Pain Scale: No hurt  Home Living                                          Prior Functioning/Environment              Frequency  Min 2X/week        Progress Toward Goals  OT Goals(current goals can now be found in the care plan section)  Progress towards OT goals: Progressing toward goals  Acute Rehab OT Goals Patient Stated Goal: return to independence OT Goal Formulation: With patient Time For Goal Achievement: 04/06/20 Potential to  Achieve Goals: Good ADL Goals Pt Will Perform Eating: with supervision;with adaptive utensils;with assist to don/doff brace/orthosis;sitting Pt Will Perform Grooming: with supervision;sitting;with adaptive equipment Pt Will Transfer to Toilet: with max assist;anterior/posterior transfer;bedside commode Additional ADL Goal #1: Pt will utilize adaptive methods/equip  to improve cell phone usage for IADL Additional ADL Goal #2: Pt will tolerate sitting unsupported EOB for 2 minutes prior to needing rest break to improve sitting tolerance for BADLs  Plan Discharge plan remains appropriate    Co-evaluation                 AM-PAC OT "6 Clicks" Daily Activity     Outcome Measure   Help from another person eating meals?: A Little Help from another person taking care of personal grooming?: A Little Help from another person toileting, which includes using toliet, bedpan, or urinal?: Total Help from another person bathing (including washing, rinsing, drying)?: Total Help from another person to put on and taking off regular upper body clothing?: A Lot Help from another person to put on and taking off regular lower body clothing?: Total 6 Click Score: 11    End of Session    OT Visit Diagnosis: Other symptoms and signs involving the nervous system (R29.898);Muscle weakness (generalized) (M62.81)   Activity Tolerance Patient tolerated treatment well   Patient Left in bed;with call bell/phone within reach;with bed alarm set   Nurse Communication Mobility status        Time: 3825-0539 OT Time Calculation (min): 10 min  Charges: OT General Charges $OT Visit: 1 Visit OT Treatments $Self Care/Home Management : 8-22 mins  Brent [name], OTR/L Acute Rehabilitation Services Pager: [phone] Office: [phone]    Brent [name] 03/23/2020, 4:12 PM

## 2020-03-23 NOTE — Progress Notes (Signed)
Physical Therapy Treatment Patient Details Name: Brent [name]. MRN: [id number] DOB: August 01, 1946 Today's Date: 03/23/2020    History of Present Illness Pt adm 6/20 with chest pain and developed RLE weakness in ED. Pt for nuclear stress test on 6/21 where his weakness progressed to BLE's and Bil hands. Neuro consulted and work up in progress. PMH - CAD, DM, peripheral neuropathy, HTN, MI, obesity    PT Comments    Pt reports increased fatigue today as he did not sleep very well last night but agreeable to get to EoB with therapy. Pt requires total A for transfer to EoB. Pt able to tolerate 20 minutes of sitting EoB with work on weightbearing through bilateral extended wrists and fingers. Plan to transfer to recliner deferred due to increased fatigue and incontinence of stool. Returned to supine with total A and mod assist for rolling for pericare. Pt no longer on Air Mattress, asked RN to order due to decreased mobility. Pt hopeful for bed at [facility] tomorrow. PT will continue to follow acutely.    Follow Up Recommendations  CIR     Equipment Recommendations  Other (comment);Wheelchair (measurements PT);Wheelchair cushion (measurements PT) (TBA)       Precautions / Restrictions Precautions Precautions: Fall Precaution Comments: sensation around T6-9 Restrictions Weight Bearing Restrictions: No    Mobility  Bed Mobility Overal bed mobility: Needs Assistance Bed Mobility: Rolling;Sidelying to Sit Rolling: Mod assist Sidelying to sit: Total assist;+2 for safety/equipment   Sit to supine: +2 for physical assistance;Total assist   General bed mobility comments: pt able to roll onto R side with modA, requires total A for LE mangement, trunk to upright and pad scoot of hips to EoB  Transfers                 General transfer comment: unable to attempt today due to increased fatigue and incontinence of stool   Ambulation/Gait Ambulation/Gait assistance:  (pt unable)                   Modified Rankin (Stroke Patients Only) Modified Rankin (Stroke Patients Only) Pre-Morbid Rankin Score: No symptoms Modified Rankin: Severe disability (if he has a cord infarct)     Balance Overall balance assessment: Needs assistance Sitting-balance support: Single extremity supported;Bilateral upper extremity supported Sitting balance-Leahy Scale: Poor Sitting balance - Comments: worked on sitting propped back on bil UEs with wrist and fingers extended, pt able to hold sitting independently for 90 sec                                     Cognition Arousal/Alertness: Awake/alert Behavior During Therapy: WFL for tasks assessed/performed Overall Cognitive Status: Within Functional Limits for tasks assessed                                 General Comments: pt continues to be optimistic and agreeable to therapy       Exercises Other Exercises Other Exercises: static sitting for 90 seconds, pt able to tolerate with extended wrists and fingers     General Comments General comments (skin integrity, edema, etc.): VSS      Pertinent Vitals/Pain Pain Assessment: No/denies pain Faces Pain Scale: No hurt           PT Goals (current goals can now be found in the care plan section) Acute  Rehab PT Goals Patient Stated Goal: return to independence PT Goal Formulation: With patient Time For Goal Achievement: 03/24/20 Potential to Achieve Goals: Good Progress towards PT goals: Progressing toward goals    Frequency    Min 3X/week      PT Plan Current plan remains appropriate       AM-PAC PT "6 Clicks" Mobility   Outcome Measure  Help needed turning from your back to your side while in a flat bed without using bedrails?: A Lot Help needed moving from lying on your back to sitting on the side of a flat bed without using bedrails?: Total Help needed moving to and from a bed to a chair (including a wheelchair)?: Total Help needed  standing up from a chair using your arms (e.g., wheelchair or bedside chair)?: Total Help needed to walk in hospital room?: Total Help needed climbing 3-5 steps with a railing? : Total 6 Click Score: 7    End of Session   Activity Tolerance: Patient tolerated treatment well Patient left: in bed;with nursing/sitter in room Nurse Communication: Mobility status;Need for lift equipment PT Visit Diagnosis: Other abnormalities of gait and mobility (R26.89);Other symptoms and signs involving the nervous system (R83.094)     Time: 0768-0881 PT Time Calculation (min) (ACUTE ONLY): 36 min  Charges:  $Therapeutic Activity: 8-22 mins $Neuromuscular Re-education: 8-22 mins                     Aleina Burgio B. Migdalia Dk PT, DPT Acute Rehabilitation Services Pager [phone] Office [phone]    [place] 03/23/2020, 2:03 PM

## 2020-03-23 NOTE — Progress Notes (Signed)
Subjective: No significant changes  Exam: Vitals:   03/23/20 1143 03/23/20 1441  BP: (!) 153/69 (!) 141/84  Pulse: (!) 48 (!) 48  Resp: 20 19  Temp: 98.3 F (36.8 C) 98.2 F (36.8 C)  SpO2: 96% 96%   Gen: In bed, NAD Resp: non-labored breathing, no acute distress Abd: soft, nt  Neuro: MS: Awake, alert, interactive and appropriate CN: Pupils equal round and reactive to light, extraocular movements intact,  Motor: He has good proximal strength bilateral upper extremities, some grip in his right hand, severely impaired grip in his left hand, and no movement in either lower extremity DTR: Hyperreflexic in bilateral legs  Pertinent Labs: NMO - 6.5(upper limit of normal is 3.1) Anti-MOG negative [facility] paraneoplastic panel - pending.  ANA, SSA,SSB, antiphospholipid, ANA - normal CSF WBC 1 CSF protein 53 CSF glucose-negative OC bands and IgG index were negative CBC 7.3 B12, B1 - normal   Impression: 74 year old male who presented with progressive myelopathy over 24 hours with longitudinally extensive lesion on MRI.  The progressive onset as well as the positivity of the antibody or strongly suggestive that this is a NMO associated longitudinally extensive transverse myelitis.  He did not have elevation of his WBC, but this can be seen in NMO.  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  Given the antibody positivity, I do think he would benefit from rituximab and would favor going ahead and giving his first dose ASAP.  Recommendations: 1) Rituxan 1 g x 1, then additional gram in 2 weeks 2) he will need rehab placement  Roland Rack, MD [facility] [phone]  If 7pm- 7am, please page neurology on call as listed in [place].

## 2020-03-23 NOTE — Care Management Important Message (Signed)
Important Message  Patient Details  Name: Brent [name]. MRN: [id number] Date of Birth: 05-23-1946   Medicare Important Message Given:  Yes     Shelda Altes 03/23/2020, 12:19 PM

## 2020-03-23 NOTE — Progress Notes (Signed)
PROGRESS NOTE    Brent [name].  GYF:[id number] DOB: 08-24-1946 DOA: 03/08/2020 PCP: [name], [name], MD   Brief Narrative:  Patient is a 74 year old male with history of coronary artery disease status post stenting, IDDM, diabetic neuropathy, sciatica, hypertension, hyperlipidemia who presented with chest pain.  Troponins were negative.  CT dissection study was negative.  Cardiology was consulted.  He underwent nuclear stress test which was unremarkable.  He then he started having weakness on left lower extremity as well as decreased ability with strength on bilateral hands.  Neurology consulted.  MRI spine showed subtle signal changes in cervical, thoracic spine.  T-spine MRI showed 6/10 mm enhancing lesion in the left T9 vertebral body.  LP was unremarkable.  Initial working diagnosis is transverse myelitis as per neurology but work up was not 3 conclusive.  Patient was started on high-dose Solu-Medrol and it was given for 5 days.   MRI C-spine and T-spine were repeated by neurology in 03/08/2020 which showed progressive cord edema extending from C4-5 to T4-5 favoring myelitis.  He was also started on IVIG and he completed the course without improvement. Patient suddenly developed acute shortness of breath/respiratory/on evening of 03/15/2020 and was moved to ICU.  Chest x-ray showed multifocal pneumonia.  Currently respiratory status is stable. Patient seen by PT/OT and recommended CIR.  Now waiting for CIR bed.  Assessment & Plan:   Active Problems:   Chest pain   Weakness   Quadriplegia (HCC)   Coronary artery disease involving native coronary artery of native heart without angina pectoris   Steroid-induced hyperglycemia   Diabetic peripheral neuropathy (HCC)   Tachypnea   Hyponatremia   AKI (acute kidney injury) ([place])   Acute hypoxemic respiratory failure (HCC)   Acute on chronic respiratory failure with hypoxia (HCC)   Transverse myelitis/cord infarction: Started  with abrupt onset of right lower extremity flaccid weakness with preservation of sensation.  The weakness spread to the left lower extremity on 6/21 and distal bilateral upper extremity weakness was noted shortly afterwards.  MRI revealed abnormal signal in the anterior cervical and thoracic spinal cord with gray matter being primarily affected, consistent with anterior spinal cord infarction.  Demyelinating disease and transverse myelitis were on the differentials.  Patient was treated with 5 days of IV methylprednisolone with improvement.  Completed 5 days course of IVIG.  LP was done and CSF cell count, protein, IgG index were normal.  Normal vitamin B12, ceruloplasmin was normal, folate was normal.  Negative SSA/SSB, ACE, ANA, normal vitamin B1.  And MO came out to be positive at 6.5.  MRI brain was negative for acute infarction or demyelination. Patient continues to have bilateral lower extremity severe weakness and weak handgrip.  Continue aspirin, Lipitor. Echocardiogram did not show any PFO, normal ejection fraction.  Outpatient loop monitoring recommended. PT/OT evaluation done and recommended CIR.  Acute hypoxic respiratory failure: Aspiration pneumonia suspected.  He had deveopled  acute respite distress and was transferred to ICU.  Chest x-ray showed multifocal pneumonia .Currently respiratory status is stable.  Lungs are clear.  Currently on room air.  Continue cefepime to complete the course of 7 days.  Chest pain: Initially presented with chest pain.  Troponins were negative.  Currently chest pain-free.  Nuclear stress test negative for reversible ischemia.  Insulin-dependent diabetes type 2: Monitor blood sugars.  Continue Lantus, sliding scale  Hypertension: Continue home medications.  Blood pressure acceptable  Hyperlipidemia: On Lipitor  History of malignant melanoma: Currently in  remission.  Being followed at [facility]  AKI: Resolved with IV fluids  Constipation: Continue bowel  regimen  Obesity: BMI of 32.9          DVT prophylaxis:SCD Code Status: Full Family Communication: Wife on phone on 03/22/20 Status is: Inpatient  Remains inpatient appropriate because:Unsafe d/c plan   Dispo:  Patient From: Home  Planned Disposition: Inpatient Rehab  Expected discharge date: tomorrow  Medically stable for discharge: Yes      Consultants: Neurology, PCCM  Procedures: LP  Antimicrobials:  Anti-infectives (From admission, onward)   Start     Dose/Rate Route Frequency Ordered Stop   03/22/20 1000  ceFEPIme (MAXIPIME) 2 g in sodium chloride 0.9 % 100 mL IVPB     Discontinue     2 g 200 mL/hr over 30 Minutes Intravenous Every 8 hours 03/22/20 0954 03/24/20 0959   03/21/20 1800  levofloxacin (LEVAQUIN) tablet 750 mg  Status:  Discontinued        750 mg Oral Daily 03/21/20 1112 03/22/20 0954   03/18/20 1000  ceFEPIme (MAXIPIME) 2 g in sodium chloride 0.9 % 100 mL IVPB  Status:  Discontinued        2 g 200 mL/hr over 30 Minutes Intravenous Every 8 hours 03/18/20 0849 03/21/20 1112      Subjective:  Patient seen and examined at the bedside this morning.  Hemodynamically stable.  Comfortable.  Denies any new complaints.  Waiting for CIR bed.  Objective: Vitals:   03/22/20 2356 03/23/20 0144 03/23/20 0406 03/23/20 0741  BP: (!) 127/97 (!) 146/55 (!) 149/56 (!) 154/65  Pulse: (!) 50  (!) 49 (!) 50  Resp: 20 18 (!) 21 18  Temp: 97.9 F (36.6 C) 98.5 F (36.9 C) 98.4 F (36.9 C) 98.6 F (37 C)  TempSrc: Oral Oral Oral Oral  SpO2: 98% 96% 96% 95%  Weight:   104.9 kg   Height:        Intake/Output Summary (Last 24 hours) at 03/23/2020 0908 Last data filed at 03/23/2020 0436 Gross per 24 hour  Intake 100 ml  Output 1500 ml  Net -1400 ml   Filed Weights   03/15/20 1905 03/22/20 0416 03/23/20 0406  Weight: 110.3 kg 107.3 kg 104.9 kg    Examination:   General exam: Appears calm and comfortable ,Not in distress,obese HEENT:PERRL,Oral mucosa  moist, Ear/Nose normal on gross exam Respiratory system: Bilateral equal air entry, normal vesicular breath sounds, no wheezes or crackles  Cardiovascular system: S1 & S2 heard, RRR. No JVD, murmurs, rubs, gallops or clicks. Gastrointestinal system: Abdomen is nondistended, soft and nontender. No organomegaly or masses felt. Normal bowel sounds heard. Central nervous system: Alert and oriented.Severe weakness of the bilateral lower extremities, bilateral hand grip. Extremities: No edema, no clubbing ,no cyanosis Skin: Improving macular rash, no ulcers,no icterus ,no pallor   Data Reviewed: I have personally reviewed following labs and imaging studies  CBC: Recent Labs  Lab 03/17/20 0216 03/18/20 0324 03/19/20 0340 03/20/20 0236 03/21/20 0334  WBC 7.5 7.4 6.3 6.9 7.3  NEUTROABS 5.5  --  4.4 4.8 5.2  HGB 12.1* 11.8* 11.4* 11.7* 11.3*  HCT 37.6* 37.4* 35.2* 36.2* 35.1*  MCV 90.4 90.6 89.6 90.0 91.2  PLT 203 227 235 271 798   Basic Metabolic Panel: Recent Labs  Lab 03/17/20 0216 03/18/20 0324 03/19/20 0340 03/20/20 0236  NA 136 134* 133* 133*  K 4.0 4.1 4.1 4.1  CL 104 104 104 105  CO2 24 22 22  21*  GLUCOSE 98 175* 171* 152*  BUN 33* 26* 26* 27*  CREATININE 0.97 0.87 1.01 0.98  CALCIUM 8.3* 8.3* 8.2* 8.4*   GFR: Estimated Creatinine Clearance: 81.5 mL/min (by C-G formula based on SCr of 0.98 mg/dL). Liver Function Tests: Recent Labs  Lab 03/18/20 0324  AST 74*  ALT 66*  ALKPHOS 76  BILITOT 0.7  PROT 6.8  ALBUMIN 2.0*   No results for input(s): LIPASE, AMYLASE in the last 168 hours. No results for input(s): AMMONIA in the last 168 hours. Coagulation Profile: No results for input(s): INR, PROTIME in the last 168 hours. Cardiac Enzymes: No results for input(s): CKTOTAL, CKMB, CKMBINDEX, TROPONINI in the last 168 hours. BNP (last 3 results) No results for input(s): PROBNP in the last 8760 hours. HbA1C: No results for input(s): HGBA1C in the last 72  hours. CBG: Recent Labs  Lab 03/22/20 0757 03/22/20 1250 03/22/20 1730 03/22/20 2130 03/23/20 0738  GLUCAP 152* 146* 167* 197* 175*   Lipid Profile: No results for input(s): CHOL, HDL, LDLCALC, TRIG, CHOLHDL, LDLDIRECT in the last 72 hours. Thyroid Function Tests: No results for input(s): TSH, T4TOTAL, FREET4, T3FREE, THYROIDAB in the last 72 hours. Anemia Panel: No results for input(s): VITAMINB12, FOLATE, FERRITIN, TIBC, IRON, RETICCTPCT in the last 72 hours. Sepsis Labs: Recent Labs  Lab 03/17/20 0216 03/18/20 0324  PROCALCITON 0.33 0.24    Recent Results (from the past 240 hour(s))  MRSA PCR Screening     Status: None   Collection Time: 03/16/20 11:49 AM   Specimen: Nasal Mucosa; Nasopharyngeal  Result Value Ref Range Status   MRSA by PCR NEGATIVE NEGATIVE Final    Comment:        The GeneXpert MRSA Assay (FDA approved for NASAL specimens only), is one component of a comprehensive MRSA colonization surveillance program. It is not intended to diagnose MRSA infection nor to guide or monitor treatment for MRSA infections. Performed at [facility], [place] [address], [place],  [address]          Radiology Studies: No results found.      Scheduled Meds: . aspirin EC  81 mg Oral QHS  . atorvastatin  20 mg Oral Daily  . chlorhexidine  15 mL Mouth Rinse BID  . Chlorhexidine Gluconate Cloth  6 each Topical Q0600  . docusate sodium  100 mg Oral BID  . enoxaparin (LOVENOX) injection  50 mg Subcutaneous Q24H  . fenofibrate  160 mg Oral QHS  . fluticasone  2 spray Each Nare QHS  . icosapent Ethyl  2 g Oral BID  . insulin aspart  0-20 Units Subcutaneous TID WC  . insulin aspart  0-5 Units Subcutaneous QHS  . insulin glargine  30 Units Subcutaneous Daily  . lidocaine  1 patch Transdermal Q24H  . mouth rinse  15 mL Mouth Rinse q12n4p  . multivitamin with minerals  1 tablet Oral Daily  . nystatin  1 application Topical TID  . oxybutynin  5 mg  Oral BID  . pantoprazole  40 mg Oral Daily  . polyethylene glycol  17 g Oral Daily  . sertraline  150 mg Oral QHS  . vitamin B-12  500 mcg Oral Daily  . zolpidem  5 mg Oral QHS   Continuous Infusions: . sodium chloride Stopped (03/17/20 0902)  . ceFEPime (MAXIPIME) IV 2 g (03/23/20 0830)     LOS: 14 days    Time spent:15 mins. More than 50% of that time was spent in  counseling and/or coordination of care.      Shelly Coss, MD [facility] P7/01/2020, 9:08 AM

## 2020-03-23 NOTE — Progress Notes (Signed)
Spoke with Laverta Baltimore, pharmacist regarding rituxan administration order.  Chemo nurse has gone for the day and earliest medication can be started would be tomorrow morning.  Laverta Baltimore states that she will notify MD.  Email sent to the [email or web address] to schedule administration in am.

## 2020-03-23 NOTE — Progress Notes (Signed)
NIF 60 VC 1.6L Good effort

## 2020-03-23 NOTE — Progress Notes (Signed)
Occupational Therapy Treatment Patient Details Name: Brent [name]. MRN: [id number] DOB: 03/12/1946 Today's Date: 03/23/2020    History of present illness Pt adm 6/20 with chest pain and developed RLE weakness in ED. Pt for nuclear stress test on 6/21 where his weakness progressed to BLE's and Bil hands. Neuro consulted and work up in progress. PMH - CAD, DM, peripheral neuropathy, HTN, MI, obesity   OT comments  RN reported that pt was unable to feed self; pt was only able to bring cup to mouth. Pt's session focused on ADL feeding with AE- red tubing for stylus already present and another red tubing added for better grip strength due to poor ROM/strength in hands. Light ROM shoulder through digits performed within ADL tasks. Pt would benefit from continued OT skilled services for ADL and activity tolerance.    Follow Up Recommendations  CIR    Equipment Recommendations  Wheelchair (measurements OT);Wheelchair cushion (measurements OT)    Recommendations for Other Services      Precautions / Restrictions Precautions Precautions: Fall Precaution Comments: sensation around T6-9 Restrictions Weight Bearing Restrictions: No       Mobility Bed Mobility Overal bed mobility: Needs Assistance Bed Mobility: Rolling;Sidelying to Sit Rolling: Mod assist Sidelying to sit: Total assist;+2 for safety/equipment   Sit to supine: +2 for physical assistance;Total assist   General bed mobility comments: in good supported sitting position for feeding task in b ed  Transfers                 General transfer comment: unable to attempt today due to increased fatigue and incontinence of stool     Balance Overall balance assessment: Needs assistance Sitting-balance support: Single extremity supported;Bilateral upper extremity supported Sitting balance-Leahy Scale: Poor Sitting balance - Comments: worked on sitting propped back on bil UEs with wrist and fingers extended, pt able to  hold sitting independently for 90 sec                                    ADL either performed or assessed with clinical judgement   ADL Overall ADL's : Needs assistance/impaired Eating/Feeding: Set up;Bed level;With adaptive utensils Eating/Feeding Details (indicate cue type and reason): Pt holding cup with 2 hand to bring to mouth from tray in front;  Pt using buit up silverware with red tubing with greater succes for fork and for stylus when using phone.                                   General ADL Comments: session focused on ADL feeding with AE- red tubing for stylus already present and another red tubing added for better grip strength due to poor ROM/strength in hands.     Vision       Perception     Praxis      Cognition Arousal/Alertness: Awake/alert Behavior During Therapy: WFL for tasks assessed/performed Overall Cognitive Status: Within Functional Limits for tasks assessed                                 General Comments: pt continues to be optimistic and agreeable to therapy         Exercises Exercises: Other exercises Other Exercises Other Exercises: static sitting for 90 seconds, pt able to tolerate  with extended wrists and fingers    Shoulder Instructions       General Comments VSS    Pertinent Vitals/ Pain       Pain Assessment: No/denies pain Faces Pain Scale: No hurt  Home Living                                          Prior Functioning/Environment              Frequency  Min 2X/week        Progress Toward Goals  OT Goals(current goals can now be found in the care plan section)  Progress towards OT goals: Progressing toward goals  Acute Rehab OT Goals Patient Stated Goal: return to independence OT Goal Formulation: With patient Time For Goal Achievement: 04/06/20 Potential to Achieve Goals: Good ADL Goals Pt Will Perform Eating: with supervision;with adaptive  utensils;with assist to don/doff brace/orthosis;sitting Pt Will Perform Grooming: with supervision;sitting;with adaptive equipment Pt Will Transfer to Toilet: with max assist;anterior/posterior transfer;bedside commode Additional ADL Goal #1: Pt will utilize adaptive methods/equip  to improve cell phone usage for IADL Additional ADL Goal #2: Pt will tolerate sitting unsupported EOB for 2 minutes prior to needing rest break to improve sitting tolerance for BADLs  Plan Discharge plan remains appropriate    Co-evaluation                 AM-PAC OT "6 Clicks" Daily Activity     Outcome Measure   Help from another person eating meals?: A Little Help from another person taking care of personal grooming?: A Little Help from another person toileting, which includes using toliet, bedpan, or urinal?: Total Help from another person bathing (including washing, rinsing, drying)?: Total Help from another person to put on and taking off regular upper body clothing?: A Lot Help from another person to put on and taking off regular lower body clothing?: Total 6 Click Score: 11    End of Session    OT Visit Diagnosis: Other symptoms and signs involving the nervous system (R29.898);Muscle weakness (generalized) (M62.81)   Activity Tolerance Patient tolerated treatment well   Patient Left in bed;with call bell/phone within reach;with bed alarm set   Nurse Communication Mobility status        Time: 0092-3300 OT Time Calculation (min): 18 min  Charges: OT General Charges $OT Visit: 1 Visit OT Treatments $Self Care/Home Management : 8-22 mins  [name], OTR/L Acute Rehabilitation Services Pager: [phone] Office: [phone]    Louine Tenpenny C 03/23/2020, 3:25 PM

## 2020-03-24 LAB — GLUCOSE, CAPILLARY
Glucose-Capillary: 191 mg/dL — ABNORMAL HIGH (ref 70–99)
Glucose-Capillary: 125 mg/dL — ABNORMAL HIGH (ref 70–99)
Glucose-Capillary: 160 mg/dL — ABNORMAL HIGH (ref 70–99)
Glucose-Capillary: 200 mg/dL — ABNORMAL HIGH (ref 70–99)
Glucose-Capillary: 249 mg/dL — ABNORMAL HIGH (ref 70–99)

## 2020-03-24 MED ORDER — ACETAMINOPHEN 325 MG PO TABS
650.0000 mg | ORAL_TABLET | Freq: Once | ORAL | Status: AC
  Administered 2020-03-24: 650 mg via ORAL
  Filled 2020-03-24: qty 2

## 2020-03-24 NOTE — Progress Notes (Signed)
PROGRESS NOTE    Brent [name].  WVP:[id number] DOB: 1946-01-03 DOA: 03/08/2020 PCP: [name], [name], MD   Brief Narrative:  Patient is a 74 year old male with history of coronary artery disease status post stenting, IDDM, diabetic neuropathy, sciatica, hypertension, hyperlipidemia who presented with chest pain.  Troponins were negative.  CT dissection study was negative.  Cardiology was consulted.  He underwent nuclear stress test which was unremarkable.  He then he started having weakness on left lower extremity as well as decreased ability with strength on bilateral hands.  Neurology consulted.  MRI spine showed subtle signal changes in cervical, thoracic spine.  T-spine MRI showed 6/10 mm enhancing lesion in the left T9 vertebral body.  LP was unremarkable.  Initial working diagnosis is transverse myelitis as per neurology but work up was not 3 conclusive.  Patient was started on high-dose Solu-Medrol and it was given for 5 days.   MRI C-spine and T-spine were repeated by neurology in 03/08/2020 which showed progressive cord edema extending from C4-5 to T4-5 favoring myelitis.  He was also started on IVIG and he completed the course without improvement. Patient suddenly developed acute shortness of breath/respiratory/on evening of 03/15/2020 and was moved to ICU.  Chest x-ray showed multifocal pneumonia.  Currently respiratory status is stable. Patient seen by PT/OT and recommended CIR.  Now waiting for CIR bed. Since NMO is positive, neurology strongly suspecting neuromyelitis optica so he was given a dose of Rituxan on 03/24/20  Assessment & Plan:   Active Problems:   Chest pain   Weakness   Quadriplegia (HCC)   Coronary artery disease involving native coronary artery of native heart without angina pectoris   Steroid-induced hyperglycemia   Diabetic peripheral neuropathy (HCC)   Tachypnea   Hyponatremia   AKI (acute kidney injury) ([place])   Acute hypoxemic respiratory  failure (HCC)   Acute on chronic respiratory failure with hypoxia (HCC)   Transverse myelitis/cord infarction: Started with abrupt onset of right lower extremity flaccid weakness with preservation of sensation.  The weakness spread to the left lower extremity on 6/21 and distal bilateral upper extremity weakness was noted shortly afterwards.  MRI revealed abnormal signal in the anterior cervical and thoracic spinal cord with gray matter being primarily affected, consistent with anterior spinal cord infarction.  Demyelinating disease and transverse myelitis were on the differentials.  Patient was treated with 5 days of IV methylprednisolone with improvement.  Completed 5 days course of IVIG.  LP was done and CSF cell count, protein, IgG index were normal.  Normal vitamin B12, ceruloplasmin was normal, folate was normal.  Negative SSA/SSB, ACE, ANA, normal vitamin B1.   MRI brain was negative for acute infarction or demyelination. Patient continues to have bilateral lower extremity severe weakness and weak handgrip.  Continue aspirin, Lipitor. Echocardiogram did not show any PFO, normal ejection fraction.  Outpatient loop monitoring recommended. PT/OT evaluation done and recommended CIR. Since NMO came out to be  positive, neurology strongly suspecting neuromyelitis optica so he was given a dose of Rituxan on 03/24/20.  Plan is to give another dose in 2 weeks.  Acute hypoxic respiratory failure: Aspiration pneumonia suspected.  He had deveopled  acute respite distress and was transferred to ICU.  Chest x-ray showed multifocal pneumonia .Currently respiratory status is stable.  Lungs are clear.  Currently on room air.  He completed the cefepime course  Chest pain: Initially presented with chest pain.  Troponins were negative.  Currently chest pain-free.  Nuclear  stress test negative for reversible ischemia.  Insulin-dependent diabetes type 2: Monitor blood sugars.  Continue Lantus, sliding  scale  Hypertension: Continue home medications.  Blood pressure acceptable  Hyperlipidemia: On Lipitor  History of malignant melanoma: Currently in remission.  Being followed at [facility]  AKI: Resolved with IV fluids  Constipation: Continue bowel regimen  Obesity: BMI of 32.9          DVT prophylaxis:SCD Code Status: Full Family Communication: Wife on phone on 03/22/20 Status is: Inpatient  Remains inpatient appropriate because:Unsafe d/c plan   Dispo:  Patient From: Home  Planned Disposition: Inpatient Rehab  Expected discharge date: On the day of bed availibility  Medically stable for discharge: Yes      Consultants: Neurology, PCCM  Procedures: LP  Antimicrobials:  Anti-infectives (From admission, onward)   Start     Dose/Rate Route Frequency Ordered Stop   03/22/20 1000  ceFEPIme (MAXIPIME) 2 g in sodium chloride 0.9 % 100 mL IVPB  Status:  Discontinued        2 g 200 mL/hr over 30 Minutes Intravenous Every 8 hours 03/22/20 0954 03/23/20 1809   03/21/20 1800  levofloxacin (LEVAQUIN) tablet 750 mg  Status:  Discontinued        750 mg Oral Daily 03/21/20 1112 03/22/20 0954   03/18/20 1000  ceFEPIme (MAXIPIME) 2 g in sodium chloride 0.9 % 100 mL IVPB  Status:  Discontinued        2 g 200 mL/hr over 30 Minutes Intravenous Every 8 hours 03/18/20 0849 03/21/20 1112      Subjective:  Patient seen and examined at the bedside this morning.  Hemodynamically stable.  Comfortable.  Denies any complaints.  Objective: Vitals:   03/24/20 0746 03/24/20 1044 03/24/20 1122 03/24/20 1142  BP: (!) 139/51 (!) 143/49 (!) 131/54 (!) 137/56  Pulse: (!) 52 (!) 49 (!) 52 (!) 51  Resp: 16 16 19 19   Temp: 98.6 F (37 C) 98.6 F (37 C) 98.5 F (36.9 C) 98.4 F (36.9 C)  TempSrc: Oral Oral Oral Oral  SpO2: 96% 97% 97% 99%  Weight:      Height:        Intake/Output Summary (Last 24 hours) at 03/24/2020 1410 Last data filed at 03/24/2020 1143 Gross per 24 hour  Intake 240 ml   Output 3000 ml  Net -2760 ml   Filed Weights   03/22/20 0416 03/23/20 0406 03/24/20 0541  Weight: 107.3 kg 104.9 kg 105.1 kg    Examination:  General exam: Appears calm and comfortable ,Not in distress,average built HEENT:PERRL,Oral mucosa moist, Ear/Nose normal on gross exam Respiratory system: Bilateral equal air entry, normal vesicular breath sounds, no wheezes or crackles  Cardiovascular system: S1 & S2 heard, RRR. No JVD, murmurs, rubs, gallops or clicks. Gastrointestinal system: Abdomen is nondistended, soft and nontender. No organomegaly or masses felt. Normal bowel sounds heard. Central nervous system: Alert and oriented.Severe weakness of the bilateral lower extremities,weak  bilateral hand grip. Extremities: No edema, no clubbing ,no cyanosis Skin: No rashes, lesions or ulcers,no icterus ,no pallor  Data Reviewed: I have personally reviewed following labs and imaging studies  CBC: Recent Labs  Lab 03/18/20 0324 03/19/20 0340 03/20/20 0236 03/21/20 0334  WBC 7.4 6.3 6.9 7.3  NEUTROABS  --  4.4 4.8 5.2  HGB 11.8* 11.4* 11.7* 11.3*  HCT 37.4* 35.2* 36.2* 35.1*  MCV 90.6 89.6 90.0 91.2  PLT 227 235 271 341   Basic Metabolic Panel: Recent Labs  Lab  03/18/20 0324 03/19/20 0340 03/20/20 0236  NA 134* 133* 133*  K 4.1 4.1 4.1  CL 104 104 105  CO2 22 22 21*  GLUCOSE 175* 171* 152*  BUN 26* 26* 27*  CREATININE 0.87 1.01 0.98  CALCIUM 8.3* 8.2* 8.4*   GFR: Estimated Creatinine Clearance: 81.6 mL/min (by C-G formula based on SCr of 0.98 mg/dL). Liver Function Tests: Recent Labs  Lab 03/18/20 0324  AST 74*  ALT 66*  ALKPHOS 76  BILITOT 0.7  PROT 6.8  ALBUMIN 2.0*   No results for input(s): LIPASE, AMYLASE in the last 168 hours. No results for input(s): AMMONIA in the last 168 hours. Coagulation Profile: No results for input(s): INR, PROTIME in the last 168 hours. Cardiac Enzymes: No results for input(s): CKTOTAL, CKMB, CKMBINDEX, TROPONINI in the  last 168 hours. BNP (last 3 results) No results for input(s): PROBNP in the last 8760 hours. HbA1C: No results for input(s): HGBA1C in the last 72 hours. CBG: Recent Labs  Lab 03/23/20 1210 03/23/20 1712 03/23/20 2110 03/24/20 0745 03/24/20 1307  GLUCAP 191* 202* 232* 125* 160*   Lipid Profile: No results for input(s): CHOL, HDL, LDLCALC, TRIG, CHOLHDL, LDLDIRECT in the last 72 hours. Thyroid Function Tests: No results for input(s): TSH, T4TOTAL, FREET4, T3FREE, THYROIDAB in the last 72 hours. Anemia Panel: No results for input(s): VITAMINB12, FOLATE, FERRITIN, TIBC, IRON, RETICCTPCT in the last 72 hours. Sepsis Labs: Recent Labs  Lab 03/18/20 0324  PROCALCITON 0.24    Recent Results (from the past 240 hour(s))  MRSA PCR Screening     Status: None   Collection Time: 03/16/20 11:49 AM   Specimen: Nasal Mucosa; Nasopharyngeal  Result Value Ref Range Status   MRSA by PCR NEGATIVE NEGATIVE Final    Comment:        The GeneXpert MRSA Assay (FDA approved for NASAL specimens only), is one component of a comprehensive MRSA colonization surveillance program. It is not intended to diagnose MRSA infection nor to guide or monitor treatment for MRSA infections. Performed at [facility], [place] [address] [place], [place] [address]          Radiology Studies: No results found.      Scheduled Meds: . aspirin EC  81 mg Oral QHS  . atorvastatin  20 mg Oral Daily  . chlorhexidine  15 mL Mouth Rinse BID  . Chlorhexidine Gluconate Cloth  6 each Topical Q0600  . docusate sodium  100 mg Oral BID  . enoxaparin (LOVENOX) injection  50 mg Subcutaneous Q24H  . fenofibrate  160 mg Oral QHS  . fluticasone  2 spray Each Nare QHS  . icosapent Ethyl  2 g Oral BID  . insulin aspart  0-20 Units Subcutaneous TID WC  . insulin aspart  0-5 Units Subcutaneous QHS  . insulin glargine  30 Units Subcutaneous Daily  . lidocaine  1 patch Transdermal Q24H  . lisinopril  20 mg Oral  Daily  . mouth rinse  15 mL Mouth Rinse q12n4p  . multivitamin with minerals  1 tablet Oral Daily  . nystatin  1 application Topical TID  . oxybutynin  5 mg Oral BID  . pantoprazole  40 mg Oral Daily  . polyethylene glycol  17 g Oral Daily  . sertraline  150 mg Oral QHS  . vitamin B-12  500 mcg Oral Daily  . zolpidem  5 mg Oral QHS   Continuous Infusions:    LOS: 15 days    Time spent:15  mins. More than 50% of that time was spent in counseling and/or coordination of care.      Shelly Coss, MD [facility] P7/02/2020, 2:10 PM

## 2020-03-24 NOTE — Progress Notes (Signed)
NIF 50  VC 1.5 L Good effort.

## 2020-03-24 NOTE — Progress Notes (Signed)
Subjective: No significant changes  Exam: Vitals:   03/24/20 0541 03/24/20 0746  BP: (!) 124/51 (!) 139/51  Pulse: (!) 51 (!) 52  Resp: 15 16  Temp: 98.3 F (36.8 C) 98.6 F (37 C)  SpO2: 95% 96%   Gen: In bed, NAD Resp: non-labored breathing, no acute distress Abd: soft, nt  Neuro: MS: Awake, alert, interactive and appropriate CN: Pupils equal round and reactive to light, extraocular movements intact,  Motor: He has good proximal strength bilateral upper extremities, some grip in his right hand, severely impaired grip in his left hand, and no movement in either lower extremity DTR: Hyperreflexic in bilateral legs  Pertinent Labs: NMO - 6.5(upper limit of normal is 3.1) Anti-MOG negative [facility] paraneoplastic panel - pending.  ANA, SSA,SSB, antiphospholipid, ANA - normal CSF WBC 1 CSF protein 53 CSF glucose-negative OC bands and IgG index were negative CBC 7.3 B12, B1 - normal   Impression: 74 year old male who presented with progressive myelopathy over 24 hours with longitudinally extensive lesion on MRI.  The progressive onset as well as the positivity of the antibody or strongly suggestive that this is a NMO associated longitudinally extensive transverse myelitis.  He did not have elevation of his WBC, but this can be seen in NMO.  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  Given the antibody positivity, I do think he would benefit from rituximab and would favor going ahead and giving his first dose ASAP.  I discussed risks and benefits of rituxan with the patient this morning and he is agreeable with proceeding with the medication.   Recommendations: 1) Rituxan 1 g x 1, then additional gram in 2 weeks.  2) he will need rehab placement  Roland Rack, MD [facility] [phone]  If 7pm- 7am, please page neurology on call as listed in [place].

## 2020-03-24 NOTE — Procedures (Signed)
Patient performed both NIF and VC with good effort.    VC 1.4L NIF > -40

## 2020-03-24 NOTE — Progress Notes (Signed)
Inpatient Rehabilitation-Admissions Coordinator   Noted pt is to get Rituxan infusion. I also do not have a bed open for this patient today. AC will follow up tomorrow for possible admit, pending bed availability.   Raechel Ache, OTR/L  Rehab Admissions Coordinator  [phone] 03/24/2020 12:47 PM

## 2020-03-25 ENCOUNTER — Encounter (HOSPITAL_COMMUNITY): Payer: Self-pay | Admitting: Physical Medicine & Rehabilitation

## 2020-03-25 ENCOUNTER — Other Ambulatory Visit: Payer: Self-pay

## 2020-03-25 ENCOUNTER — Inpatient Hospital Stay (HOSPITAL_COMMUNITY)
Admission: RE | Admit: 2020-03-25 | Discharge: 2020-04-24 | DRG: 097 | Disposition: A | Discharge status: Skilled Nursing Facility | Payer: Medicare Other | Source: Intra-hospital | Attending: Physical Medicine & Rehabilitation | Admitting: Physical Medicine & Rehabilitation

## 2020-03-25 DIAGNOSIS — K5900 Constipation, unspecified: Secondary | ICD-10-CM | POA: Diagnosis present

## 2020-03-25 DIAGNOSIS — K592 Neurogenic bowel, not elsewhere classified: Secondary | ICD-10-CM

## 2020-03-25 DIAGNOSIS — I10 Essential (primary) hypertension: Secondary | ICD-10-CM | POA: Diagnosis present

## 2020-03-25 DIAGNOSIS — Z880 Allergy status to penicillin: Secondary | ICD-10-CM

## 2020-03-25 DIAGNOSIS — E1165 Type 2 diabetes mellitus with hyperglycemia: Secondary | ICD-10-CM | POA: Diagnosis present

## 2020-03-25 DIAGNOSIS — Z20822 Contact with and (suspected) exposure to covid-19: Secondary | ICD-10-CM | POA: Diagnosis present

## 2020-03-25 DIAGNOSIS — L89156 Pressure-induced deep tissue damage of sacral region: Secondary | ICD-10-CM | POA: Diagnosis not present

## 2020-03-25 DIAGNOSIS — R1084 Generalized abdominal pain: Secondary | ICD-10-CM | POA: Diagnosis not present

## 2020-03-25 DIAGNOSIS — I252 Old myocardial infarction: Secondary | ICD-10-CM | POA: Diagnosis not present

## 2020-03-25 DIAGNOSIS — E1140 Type 2 diabetes mellitus with diabetic neuropathy, unspecified: Secondary | ICD-10-CM | POA: Diagnosis present

## 2020-03-25 DIAGNOSIS — R2681 Unsteadiness on feet: Secondary | ICD-10-CM | POA: Diagnosis not present

## 2020-03-25 DIAGNOSIS — G373 Acute transverse myelitis in demyelinating disease of central nervous system: Principal | ICD-10-CM

## 2020-03-25 DIAGNOSIS — T380X5A Adverse effect of glucocorticoids and synthetic analogues, initial encounter: Secondary | ICD-10-CM | POA: Diagnosis not present

## 2020-03-25 DIAGNOSIS — Z85820 Personal history of malignant melanoma of skin: Secondary | ICD-10-CM | POA: Diagnosis not present

## 2020-03-25 DIAGNOSIS — Z881 Allergy status to other antibiotic agents status: Secondary | ICD-10-CM

## 2020-03-25 DIAGNOSIS — R7401 Elevation of levels of liver transaminase levels: Secondary | ICD-10-CM

## 2020-03-25 DIAGNOSIS — R532 Functional quadriplegia: Secondary | ICD-10-CM | POA: Diagnosis not present

## 2020-03-25 DIAGNOSIS — J302 Other seasonal allergic rhinitis: Secondary | ICD-10-CM | POA: Diagnosis present

## 2020-03-25 DIAGNOSIS — L98423 Non-pressure chronic ulcer of back with necrosis of muscle: Secondary | ICD-10-CM | POA: Insufficient documentation

## 2020-03-25 DIAGNOSIS — L89153 Pressure ulcer of sacral region, stage 3: Secondary | ICD-10-CM | POA: Diagnosis not present

## 2020-03-25 DIAGNOSIS — Z6832 Body mass index (BMI) 32.0-32.9, adult: Secondary | ICD-10-CM | POA: Diagnosis not present

## 2020-03-25 DIAGNOSIS — Z87891 Personal history of nicotine dependence: Secondary | ICD-10-CM

## 2020-03-25 DIAGNOSIS — G360 Neuromyelitis optica [Devic]: Secondary | ICD-10-CM | POA: Diagnosis not present

## 2020-03-25 DIAGNOSIS — Z7982 Long term (current) use of aspirin: Secondary | ICD-10-CM

## 2020-03-25 DIAGNOSIS — D62 Acute posthemorrhagic anemia: Secondary | ICD-10-CM | POA: Diagnosis not present

## 2020-03-25 DIAGNOSIS — R0989 Other specified symptoms and signs involving the circulatory and respiratory systems: Secondary | ICD-10-CM | POA: Diagnosis not present

## 2020-03-25 DIAGNOSIS — N390 Urinary tract infection, site not specified: Secondary | ICD-10-CM

## 2020-03-25 DIAGNOSIS — E669 Obesity, unspecified: Secondary | ICD-10-CM | POA: Diagnosis present

## 2020-03-25 DIAGNOSIS — M8708 Idiopathic aseptic necrosis of bone, other site: Secondary | ICD-10-CM | POA: Diagnosis not present

## 2020-03-25 DIAGNOSIS — D702 Other drug-induced agranulocytosis: Secondary | ICD-10-CM | POA: Diagnosis not present

## 2020-03-25 DIAGNOSIS — M6281 Muscle weakness (generalized): Secondary | ICD-10-CM | POA: Diagnosis not present

## 2020-03-25 DIAGNOSIS — Z794 Long term (current) use of insulin: Secondary | ICD-10-CM | POA: Diagnosis not present

## 2020-03-25 DIAGNOSIS — R7309 Other abnormal glucose: Secondary | ICD-10-CM | POA: Diagnosis not present

## 2020-03-25 DIAGNOSIS — G8250 Quadriplegia, unspecified: Secondary | ICD-10-CM | POA: Diagnosis not present

## 2020-03-25 DIAGNOSIS — R109 Unspecified abdominal pain: Secondary | ICD-10-CM

## 2020-03-25 DIAGNOSIS — N319 Neuromuscular dysfunction of bladder, unspecified: Secondary | ICD-10-CM | POA: Diagnosis not present

## 2020-03-25 DIAGNOSIS — G4733 Obstructive sleep apnea (adult) (pediatric): Secondary | ICD-10-CM | POA: Diagnosis present

## 2020-03-25 DIAGNOSIS — Z7289 Other problems related to lifestyle: Secondary | ICD-10-CM

## 2020-03-25 DIAGNOSIS — Z86010 Personal history of colonic polyps: Secondary | ICD-10-CM

## 2020-03-25 DIAGNOSIS — M2550 Pain in unspecified joint: Secondary | ICD-10-CM | POA: Diagnosis not present

## 2020-03-25 DIAGNOSIS — M7989 Other specified soft tissue disorders: Secondary | ICD-10-CM | POA: Diagnosis not present

## 2020-03-25 DIAGNOSIS — E1142 Type 2 diabetes mellitus with diabetic polyneuropathy: Secondary | ICD-10-CM | POA: Diagnosis not present

## 2020-03-25 DIAGNOSIS — E785 Hyperlipidemia, unspecified: Secondary | ICD-10-CM

## 2020-03-25 DIAGNOSIS — Z7401 Bed confinement status: Secondary | ICD-10-CM | POA: Diagnosis not present

## 2020-03-25 DIAGNOSIS — J189 Pneumonia, unspecified organism: Secondary | ICD-10-CM

## 2020-03-25 DIAGNOSIS — B9620 Unspecified Escherichia coli [E. coli] as the cause of diseases classified elsewhere: Secondary | ICD-10-CM | POA: Diagnosis present

## 2020-03-25 DIAGNOSIS — I2510 Atherosclerotic heart disease of native coronary artery without angina pectoris: Secondary | ICD-10-CM | POA: Diagnosis present

## 2020-03-25 DIAGNOSIS — D72819 Decreased white blood cell count, unspecified: Secondary | ICD-10-CM

## 2020-03-25 DIAGNOSIS — R41841 Cognitive communication deficit: Secondary | ICD-10-CM | POA: Diagnosis not present

## 2020-03-25 DIAGNOSIS — R1312 Dysphagia, oropharyngeal phase: Secondary | ICD-10-CM | POA: Diagnosis not present

## 2020-03-25 DIAGNOSIS — G9511 Acute infarction of spinal cord (embolic) (nonembolic): Secondary | ICD-10-CM | POA: Insufficient documentation

## 2020-03-25 DIAGNOSIS — Z955 Presence of coronary angioplasty implant and graft: Secondary | ICD-10-CM | POA: Diagnosis not present

## 2020-03-25 DIAGNOSIS — Z79899 Other long term (current) drug therapy: Secondary | ICD-10-CM

## 2020-03-25 DIAGNOSIS — Z9049 Acquired absence of other specified parts of digestive tract: Secondary | ICD-10-CM

## 2020-03-25 DIAGNOSIS — I959 Hypotension, unspecified: Secondary | ICD-10-CM | POA: Diagnosis not present

## 2020-03-25 DIAGNOSIS — R195 Other fecal abnormalities: Secondary | ICD-10-CM | POA: Diagnosis not present

## 2020-03-25 LAB — CBC WITH DIFFERENTIAL/PLATELET
Abs Immature Granulocytes: 0.03 10*3/uL (ref 0.00–0.07)
Basophils Absolute: 0 10*3/uL (ref 0.0–0.1)
Basophils Relative: 0 %
Eosinophils Absolute: 0.1 10*3/uL (ref 0.0–0.5)
Eosinophils Relative: 2 %
HCT: 35.5 % — ABNORMAL LOW (ref 39.0–52.0)
Hemoglobin: 11.1 g/dL — ABNORMAL LOW (ref 13.0–17.0)
Immature Granulocytes: 1 %
Lymphocytes Relative: 14 %
Lymphs Abs: 0.6 10*3/uL — ABNORMAL LOW (ref 0.7–4.0)
MCH: 28.5 pg (ref 26.0–34.0)
MCHC: 31.3 g/dL (ref 30.0–36.0)
MCV: 91.3 fL (ref 80.0–100.0)
Monocytes Absolute: 0.2 10*3/uL (ref 0.1–1.0)
Monocytes Relative: 5 %
Neutro Abs: 3.4 10*3/uL (ref 1.7–7.7)
Neutrophils Relative %: 78 %
Platelets: 269 10*3/uL (ref 150–400)
RBC: 3.89 MIL/uL — ABNORMAL LOW (ref 4.22–5.81)
RDW: 13.7 % (ref 11.5–15.5)
WBC: 4.4 10*3/uL (ref 4.0–10.5)
nRBC: 0 % (ref 0.0–0.2)

## 2020-03-25 LAB — BASIC METABOLIC PANEL
Anion gap: 7 (ref 5–15)
BUN: 29 mg/dL — ABNORMAL HIGH (ref 8–23)
CO2: 22 mmol/L (ref 22–32)
Calcium: 8.6 mg/dL — ABNORMAL LOW (ref 8.9–10.3)
Chloride: 106 mmol/L (ref 98–111)
Creatinine, Ser: 0.95 mg/dL (ref 0.61–1.24)
GFR calc Af Amer: >60 mL/min (ref >60)
GFR calc non Af Amer: >60 mL/min (ref >60)
Glucose, Bld: 130 mg/dL — ABNORMAL HIGH (ref 70–99)
Potassium: 4.2 mmol/L (ref 3.5–5.1)
Sodium: 135 mmol/L (ref 135–145)

## 2020-03-25 LAB — GLUCOSE, CAPILLARY
Glucose-Capillary: 116 mg/dL — ABNORMAL HIGH (ref 70–99)
Glucose-Capillary: 197 mg/dL — ABNORMAL HIGH (ref 70–99)
Glucose-Capillary: 166 mg/dL — ABNORMAL HIGH (ref 70–99)
Glucose-Capillary: 190 mg/dL — ABNORMAL HIGH (ref 70–99)

## 2020-03-25 MED ORDER — PANTOPRAZOLE SODIUM 40 MG PO TBEC
40.0000 mg | DELAYED_RELEASE_TABLET | Freq: Every day | ORAL | Status: DC
  Administered 2020-03-26 – 2020-04-24 (×30): 40 mg via ORAL
  Filled 2020-03-25 (×30): qty 1

## 2020-03-25 MED ORDER — DOCUSATE SODIUM 100 MG PO CAPS
100.0000 mg | ORAL_CAPSULE | Freq: Two times a day (BID) | ORAL | Status: DC
  Administered 2020-03-25 – 2020-03-30 (×11): 100 mg via ORAL
  Filled 2020-03-25 (×12): qty 1

## 2020-03-25 MED ORDER — INSULIN ASPART 100 UNIT/ML ~~LOC~~ SOLN
0.0000 [IU] | Freq: Three times a day (TID) | SUBCUTANEOUS | Status: DC
  Administered 2020-03-25: 4 [IU] via SUBCUTANEOUS
  Administered 2020-03-26: 3 [IU] via SUBCUTANEOUS
  Administered 2020-03-26: 4 [IU] via SUBCUTANEOUS
  Administered 2020-03-27: 7 [IU] via SUBCUTANEOUS
  Administered 2020-03-27 – 2020-03-28 (×3): 3 [IU] via SUBCUTANEOUS
  Administered 2020-03-28 – 2020-03-29 (×3): 4 [IU] via SUBCUTANEOUS
  Administered 2020-03-30 – 2020-04-01 (×5): 3 [IU] via SUBCUTANEOUS
  Administered 2020-04-01 – 2020-04-02 (×2): 4 [IU] via SUBCUTANEOUS
  Administered 2020-04-02 (×2): 3 [IU] via SUBCUTANEOUS
  Administered 2020-04-03: 4 [IU] via SUBCUTANEOUS
  Administered 2020-04-03 – 2020-04-05 (×2): 3 [IU] via SUBCUTANEOUS
  Administered 2020-04-05: 4 [IU] via SUBCUTANEOUS
  Administered 2020-04-06 – 2020-04-07 (×4): 3 [IU] via SUBCUTANEOUS
  Administered 2020-04-07: 4 [IU] via SUBCUTANEOUS
  Administered 2020-04-08 – 2020-04-09 (×3): 3 [IU] via SUBCUTANEOUS
  Administered 2020-04-09: 4 [IU] via SUBCUTANEOUS
  Administered 2020-04-10: 3 [IU] via SUBCUTANEOUS
  Administered 2020-04-10 – 2020-04-11 (×2): 4 [IU] via SUBCUTANEOUS
  Administered 2020-04-12 – 2020-04-13 (×2): 3 [IU] via SUBCUTANEOUS
  Administered 2020-04-13: 4 [IU] via SUBCUTANEOUS
  Administered 2020-04-14: 3 [IU] via SUBCUTANEOUS
  Administered 2020-04-14: 4 [IU] via SUBCUTANEOUS
  Administered 2020-04-15: 3 [IU] via SUBCUTANEOUS
  Administered 2020-04-15 – 2020-04-16 (×3): 4 [IU] via SUBCUTANEOUS
  Administered 2020-04-17: 3 [IU] via SUBCUTANEOUS
  Administered 2020-04-17 (×2): 4 [IU] via SUBCUTANEOUS
  Administered 2020-04-18: 3 [IU] via SUBCUTANEOUS
  Administered 2020-04-18: 4 [IU] via SUBCUTANEOUS
  Administered 2020-04-19 – 2020-04-20 (×3): 3 [IU] via SUBCUTANEOUS
  Administered 2020-04-22: 4 [IU] via SUBCUTANEOUS
  Administered 2020-04-22 – 2020-04-23 (×3): 3 [IU] via SUBCUTANEOUS
  Administered 2020-04-24: 4 [IU] via SUBCUTANEOUS
  Administered 2020-04-24: 3 [IU] via SUBCUTANEOUS

## 2020-03-25 MED ORDER — ICOSAPENT ETHYL 1 G PO CAPS
2.0000 g | ORAL_CAPSULE | Freq: Two times a day (BID) | ORAL | Status: DC
  Administered 2020-03-25 – 2020-04-24 (×60): 2 g via ORAL
  Filled 2020-03-25 (×62): qty 2

## 2020-03-25 MED ORDER — LIDOCAINE 5 % EX PTCH: 1.0000 | MEDICATED_PATCH | CUTANEOUS | 0 refills | Status: DC

## 2020-03-25 MED ORDER — INSULIN GLARGINE 100 UNIT/ML ~~LOC~~ SOLN
30.0000 [IU] | Freq: Every day | SUBCUTANEOUS | Status: DC
  Administered 2020-03-26 – 2020-04-24 (×30): 30 [IU] via SUBCUTANEOUS
  Filled 2020-03-25 (×31): qty 0.3

## 2020-03-25 MED ORDER — SERTRALINE HCL 50 MG PO TABS
150.0000 mg | ORAL_TABLET | Freq: Every day | ORAL | Status: DC
  Administered 2020-03-25 – 2020-04-23 (×30): 150 mg via ORAL
  Filled 2020-03-25 (×31): qty 3

## 2020-03-25 MED ORDER — ALBUTEROL SULFATE (2.5 MG/3ML) 0.083% IN NEBU: 2.5000 mg | INHALATION_SOLUTION | Freq: Once | RESPIRATORY_TRACT | Status: DC | PRN

## 2020-03-25 MED ORDER — DOCUSATE SODIUM 100 MG PO CAPS: 100.0000 mg | ORAL_CAPSULE | Freq: Two times a day (BID) | ORAL | 0 refills | Status: DC

## 2020-03-25 MED ORDER — PANTOPRAZOLE SODIUM 40 MG PO TBEC: 40.0000 mg | DELAYED_RELEASE_TABLET | Freq: Every day | ORAL | Status: DC

## 2020-03-25 MED ORDER — POLYETHYLENE GLYCOL 3350 17 G PO PACK
17.0000 g | PACK | Freq: Every day | ORAL | Status: DC
  Administered 2020-03-26 – 2020-04-03 (×8): 17 g via ORAL
  Filled 2020-03-25 (×9): qty 1

## 2020-03-25 MED ORDER — FLUTICASONE PROPIONATE 50 MCG/ACT NA SUSP
2.0000 | Freq: Every day | NASAL | Status: DC
  Administered 2020-03-25 – 2020-04-04 (×11): 2 via NASAL
  Filled 2020-03-25: qty 16

## 2020-03-25 MED ORDER — FLEET ENEMA 7-19 GM/118ML RE ENEM: 1.0000 | ENEMA | Freq: Every day | RECTAL | Status: DC | PRN

## 2020-03-25 MED ORDER — FENOFIBRATE 160 MG PO TABS
160.0000 mg | ORAL_TABLET | Freq: Every day | ORAL | Status: DC
  Administered 2020-03-25 – 2020-04-23 (×30): 160 mg via ORAL
  Filled 2020-03-25 (×32): qty 1

## 2020-03-25 MED ORDER — ASPIRIN 81 MG PO TBEC: 81.0000 mg | DELAYED_RELEASE_TABLET | Freq: Every day | ORAL | 11 refills | Status: DC

## 2020-03-25 MED ORDER — POLYETHYLENE GLYCOL 3350 17 G PO PACK: 17.0000 g | PACK | Freq: Every day | ORAL | 0 refills | Status: DC

## 2020-03-25 MED ORDER — TRAMADOL HCL 50 MG PO TABS
50.0000 mg | ORAL_TABLET | Freq: Four times a day (QID) | ORAL | Status: DC | PRN
  Administered 2020-03-25 – 2020-04-23 (×33): 50 mg via ORAL
  Filled 2020-03-25 (×34): qty 1

## 2020-03-25 MED ORDER — ATORVASTATIN CALCIUM 20 MG PO TABS: 20.0000 mg | ORAL_TABLET | Freq: Every day | ORAL | Status: DC

## 2020-03-25 MED ORDER — VITAMIN B-12 1000 MCG PO TABS
500.0000 ug | ORAL_TABLET | Freq: Every day | ORAL | Status: DC
  Administered 2020-03-26 – 2020-04-24 (×30): 500 ug via ORAL
  Filled 2020-03-25 (×30): qty 1

## 2020-03-25 MED ORDER — ATORVASTATIN CALCIUM 10 MG PO TABS
20.0000 mg | ORAL_TABLET | Freq: Every day | ORAL | Status: DC
  Administered 2020-03-26 – 2020-04-24 (×30): 20 mg via ORAL
  Filled 2020-03-25 (×30): qty 2

## 2020-03-25 MED ORDER — ENOXAPARIN SODIUM 60 MG/0.6ML ~~LOC~~ SOLN: 50.0000 mg | SUBCUTANEOUS | Status: DC

## 2020-03-25 MED ORDER — LORAZEPAM 0.5 MG PO TABS
0.5000 mg | ORAL_TABLET | ORAL | Status: DC | PRN
  Administered 2020-04-23: 0.5 mg via ORAL
  Filled 2020-03-25 (×2): qty 1

## 2020-03-25 MED ORDER — LISINOPRIL 20 MG PO TABS
20.0000 mg | ORAL_TABLET | Freq: Every day | ORAL | Status: DC
  Administered 2020-03-26 – 2020-04-10 (×16): 20 mg via ORAL
  Filled 2020-03-25 (×16): qty 1

## 2020-03-25 MED ORDER — LORAZEPAM 0.5 MG PO TABS
1.0000 mg | ORAL_TABLET | Freq: Every evening | ORAL | Status: DC | PRN
  Administered 2020-03-27 – 2020-04-14 (×11): 1 mg via ORAL
  Filled 2020-03-25 (×11): qty 2

## 2020-03-25 MED ORDER — ASPIRIN EC 81 MG PO TBEC
81.0000 mg | DELAYED_RELEASE_TABLET | Freq: Every day | ORAL | Status: DC
  Administered 2020-03-25 – 2020-04-23 (×30): 81 mg via ORAL
  Filled 2020-03-25 (×30): qty 1

## 2020-03-25 MED ORDER — INSULIN GLARGINE 100 UNITS/ML SOLOSTAR PEN: 30.0000 [IU] | PEN_INJECTOR | Freq: Every day | SUBCUTANEOUS | 11 refills | Status: DC

## 2020-03-25 MED ORDER — ADULT MULTIVITAMIN W/MINERALS CH
1.0000 | ORAL_TABLET | Freq: Every day | ORAL | Status: DC
  Administered 2020-03-26 – 2020-04-24 (×30): 1 via ORAL
  Filled 2020-03-25 (×30): qty 1

## 2020-03-25 MED ORDER — ENOXAPARIN SODIUM 60 MG/0.6ML ~~LOC~~ SOLN
50.0000 mg | SUBCUTANEOUS | Status: DC
  Administered 2020-03-26 – 2020-04-24 (×30): 50 mg via SUBCUTANEOUS
  Filled 2020-03-25 (×33): qty 0.5

## 2020-03-25 MED ORDER — LIDOCAINE 5 % EX PTCH
1.0000 | MEDICATED_PATCH | CUTANEOUS | Status: DC
  Administered 2020-03-25 – 2020-04-23 (×30): 1 via TRANSDERMAL
  Filled 2020-03-25 (×29): qty 1

## 2020-03-25 MED ORDER — OXYBUTYNIN CHLORIDE 5 MG PO TABS
5.0000 mg | ORAL_TABLET | Freq: Two times a day (BID) | ORAL | Status: DC
  Administered 2020-03-25 – 2020-04-24 (×60): 5 mg via ORAL
  Filled 2020-03-25 (×60): qty 1

## 2020-03-25 MED ORDER — NYSTATIN 100000 UNIT/GM EX POWD
1.0000 "application " | Freq: Three times a day (TID) | CUTANEOUS | Status: DC
  Administered 2020-03-25 – 2020-04-24 (×79): 1 via TOPICAL
  Filled 2020-03-25 (×2): qty 15

## 2020-03-25 NOTE — Progress Notes (Signed)
Physical Therapy Treatment Patient Details Name: Brent [name]. MRN: [id number] DOB: 10-12-1945 Today's Date: 03/25/2020    History of Present Illness Pt adm 6/20 with chest pain and developed RLE weakness in ED. Pt for nuclear stress test on 6/21 where his weakness progressed to BLE's and Bil hands. Neuro consulted and work up in progress. PMH - CAD, DM, peripheral neuropathy, HTN, MI, obesity    PT Comments    Pt laying supine in bed on hospital bed on entry. Plan was to lift to recliner and work on seated balance, however pt reports he is going to CIR after lunch. Plan revised to work on seated balance with bed in chair position. When covers were pulled back pt found to be incontinent of stool and urine. Enlisted RN/NT help for cleaning, while waiting for them to gather supplies worked with pt on crossbody reaching for side rails and holding with HoB elevated. While RN/NT cleaning, worked with pt on rolling and holding sidelying x5 each side due to neurogenic bladder/bowel incontinence. Discussed need for turning pt due to Air Bed not being placed to prevent pressure injuries. Skin tear on sacrum noted when foam pad removed. Request RN ask for Air Bed when giving report for transfer to CIR this afternoon.     Follow Up Recommendations  CIR     Equipment Recommendations  Other (comment);Wheelchair (measurements PT);Wheelchair cushion (measurements PT) (TBA)       Precautions / Restrictions Precautions Precautions: Fall Precaution Comments: sensation around T6-9 Restrictions Weight Bearing Restrictions: No    Mobility  Bed Mobility Overal bed mobility: Needs Assistance Bed Mobility: Rolling;Sidelying to Sit Rolling: Mod assist         General bed mobility comments: pt modA for turning x 5 each way for ongoing pericare secondary to neurogenic bladder/bowel   Transfers                 General transfer comment: unable to attempt due to fatigue from rolling to clean  up   Ambulation/Gait Ambulation/Gait assistance:  (pt unable)                  Modified Rankin (Stroke Patients Only) Modified Rankin (Stroke Patients Only) Pre-Morbid Rankin Score: No symptoms Modified Rankin: Severe disability (if he has a cord infarct)        Cognition Arousal/Alertness: Awake/alert Behavior During Therapy: WFL for tasks assessed/performed Overall Cognitive Status: Within Functional Limits for tasks assessed                                 General Comments: pt continues to be optimistic and agreeable to therapy       Exercises Other Exercises Other Exercises: with HoB elevated 5x each side reaching for bed rail, hooking with elbow and pulling across body     General Comments General comments (skin integrity, edema, etc.): VSS on RA      Pertinent Vitals/Pain Pain Assessment: Faces Faces Pain Scale: Hurts a little bit Pain Location: trigger point on L shoulder Pain Descriptors / Indicators: Aching;Sore Pain Intervention(s): Monitored during session;Repositioned           PT Goals (current goals can now be found in the care plan section) Acute Rehab PT Goals Patient Stated Goal: return to independence PT Goal Formulation: With patient Time For Goal Achievement: 03/24/20 Potential to Achieve Goals: Good Progress towards PT goals: Not progressing toward goals - comment (limited  by continuous stool/urine cleaning)    Frequency    Min 3X/week      PT Plan Current plan remains appropriate    Co-evaluation PT/OT/SLP Co-Evaluation/Treatment: Yes            AM-PAC PT "6 Clicks" Mobility   Outcome Measure  Help needed turning from your back to your side while in a flat bed without using bedrails?: A Lot Help needed moving from lying on your back to sitting on the side of a flat bed without using bedrails?: Total Help needed moving to and from a bed to a chair (including a wheelchair)?: Total Help needed standing up  from a chair using your arms (e.g., wheelchair or bedside chair)?: Total Help needed to walk in hospital room?: Total Help needed climbing 3-5 steps with a railing? : Total 6 Click Score: 7    End of Session Equipment Utilized During Treatment:  (lift) Activity Tolerance: Patient tolerated treatment well Patient left: in bed;with nursing/sitter in room Nurse Communication: Mobility status;Need for lift equipment PT Visit Diagnosis: Other abnormalities of gait and mobility (R26.89);Other symptoms and signs involving the nervous system (T34.287)     Time: 6811-5726 PT Time Calculation (min) (ACUTE ONLY): 57 min  Charges:  $Therapeutic Exercise: 8-22 mins $Therapeutic Activity: 38-52 mins                     Kayle Correa B. Migdalia Dk PT, DPT Acute Rehabilitation Services Pager [phone] Office [phone]    [place] 03/25/2020, 1:27 PM

## 2020-03-25 NOTE — Progress Notes (Signed)
Patient arrived and educated about rehab procedures. Spouse at bedside, educated about infection prevention measures. Verbalized that will call for assistance. Resting in bed with call bell in place.

## 2020-03-25 NOTE — Progress Notes (Signed)
Brent Arn, MD  Physician  Physical Medicine and Rehabilitation  Consult Note     Signed  Date of Service:  03/11/2020  6:07 AM      Related encounter: ED to Hosp-Admission (Discharged) from 03/08/2020 in [facility]      Signed      Expand All Collapse All  Show:Clear all [x] Manual[x] Template[] Copied  Added by: [x] Angiulli, Lavon Paganini, PA-C[x] Posey Pronto, Domenick Bookbinder, MD  [] Hover for details          Physical Medicine and Rehabilitation Consult Reason for Consult: Bilateral upper and lower extremity weakness Referring Physician: [facility]     HPI: Brent Duchemin. is a 74 y.o. right-handed male with history of remote MI stenting x3 in 1990s maintained on aspirin,, diabetes mellitus with neuropathy, hyperlipidemia, obesity with BMI 29.42.  Per chart review lives with spouse and 39 year old grandson.  History taken from chart review, patient, and wife.  Reportedly independent prior to admission.  Two-level home half bath on main level bedroom upstairs.  He presented on 03/08/20 with chest pain radiating to his back associated shortness of breath and diaphoresis as well as reported some right leg weakness then quickly had bilateral lower extremity weakness as well as numbness in both hands..  Troponin unremarkable and chemistries within normal limits except glucose 229.  Chest x-ray no acute process.  CT angiogram of chest negative for aortic aneurysm or dissection.  No acute findings in the chest abdomen or pelvis.  MRI thoracic spine showed a 6 x 10 mm enhancing lesion left T9 vertebral body appearance nonspecific.  No cord compression or cause for myelopathy identified.  MRI cervical spine normal enhancement no mass lesion.  Cranial CT scan negative. Neurology follow-up lumbar puncture completed for work-up current results pending.  Placed on IV Solu-Medrol 1 g x 5 days question transverse myelitis.  Cardiology follow-up for issues in regards to chest pain Lexiscan  Myoview study pending.  Therapy evaluation completed with recommendations of physical medicine rehab consult.   Review of Systems  Constitutional: Negative for chills and fever.  HENT: Negative for hearing loss.   Eyes: Negative for blurred vision and double vision.  Respiratory: Negative for cough.   Gastrointestinal: Positive for constipation. Negative for heartburn, nausea and vomiting.  Genitourinary: Negative for dysuria, flank pain and hematuria.  Musculoskeletal: Positive for joint pain and myalgias.  Skin: Negative for rash.  Neurological: Positive for sensory change, focal weakness and weakness.  Psychiatric/Behavioral: Positive for depression.  All other systems reviewed and are negative.       Past Medical History:  Diagnosis Date  . Allergy    . CAD (coronary artery disease)      a. angioplasty of his RCA in 1990. b. bare metal stent placed in the RCA in 2000 followed by rotational atherectomy shortly after for stent restenosis. c. last cath was in 2012 showing stable moderate diffuse CAD. (70% mid LAD, 80% diagonal, 70% Ramus, 40% mid to distal RCA stent restenosis). d. Low risk nuc in 2015.  . Diabetes mellitus    . Diverticulosis    . Elevated CK    . Erectile dysfunction    . Hemorrhoids    . HTN (hypertension)    . Hyperlipidemia    . Hypertriglyceridemia    . Malignant melanoma of left side of neck ([place]) 10/25/2018  . Myocardial infarction ([place])    . Obesity    . OSA (obstructive sleep apnea)    . Persistent disorder of  initiating or maintaining sleep    . Personal history of colonic polyps 02/05/2003         Past Surgical History:  Procedure Laterality Date  . CHOLECYSTECTOMY      . CORONARY ANGIOPLASTY      . CORONARY STENT PLACEMENT        stenting of the right coronary artery with followup rotational  atherectomy. (3 stents placed)  . FINGER SURGERY        right  . FOOT SURGERY        right  . INGUINAL HERNIA REPAIR        right  . melanoma removal         neck  . ORTHOPEDIC SURGERY        foot right  . rotator cuff surg        Bil         Family History  Problem Relation Age of Onset  . Liver cancer Father    . Lung cancer Father    . Heart disease Father    . Heart disease Sister    . Lung cancer Mother    . COPD Mother    . Obesity Mother    . Colon cancer Neg Hx    . Stomach cancer Neg Hx    . Pancreatic cancer Neg Hx      Social History:  reports that he quit smoking about 39 years ago. His smoking use included cigarettes. He has a 45.00 pack-year smoking history. He has never used smokeless tobacco. He reports current alcohol use of about 7.0 - 14.0 standard drinks of alcohol per week. He reports that he does not use drugs. Allergies:      Allergies  Allergen Reactions  . Penicillins Hives and Rash          Medications Prior to Admission  Medication Sig Dispense Refill  . aspirin EC 81 MG tablet Take 81 mg by mouth at bedtime. Swallow whole.      . empagliflozin (JARDIANCE) 25 MG TABS tablet Take 12.5 mg by mouth daily.      . fenofibrate 160 MG tablet Take 160 mg by mouth at bedtime.       . fluticasone (FLONASE) 50 MCG/ACT nasal spray SPRAY 2 SPRAYS INTO EACH NOSTRIL EVERY DAY (Patient taking differently: Place 2 sprays into both nostrils at bedtime. ) 48 mL 3  . ibuprofen (ADVIL) 200 MG tablet Take 600 mg by mouth daily as needed for headache (pain).      [name] Ethyl (VASCEPA) 1 g CAPS Take 2 capsules (2 g total) by mouth 2 (two) times daily. 120 capsule 3  . insulin aspart (NOVOLOG FLEXPEN) 100 UNIT/ML FlexPen Inject 25-30 Units into the skin 3 (three) times daily before meals.       . insulin glargine (LANTUS) 100 unit/mL SOPN Inject 60 Units into the skin at bedtime.       Marland Kitchen lisinopril (ZESTRIL) 10 MG tablet Take 10 mg by mouth daily. For kidney protection      . LORazepam (ATIVAN) 1 MG tablet TAKE 1 TABLET BY MOUTH TWICE A DAY AS NEEDED FOR ANXIETY (Patient taking differently: Take 1 mg by mouth See  admin instructions. Take one tablet (1 mg) by mouth daily at bedtime, may also take one tablet (1 mg) during the day as needed for anxiety) 180 tablet 0  . metFORMIN (GLUCOPHAGE) 1000 MG tablet Take 1,000 mg by mouth 2 (two) times daily with a meal.      .  Multiple Vitamin (MULTIVITAMIN WITH MINERALS) TABS tablet Take 1 tablet by mouth daily. Centrum Silver Men's      . oxybutynin (DITROPAN) 5 MG tablet Take 5 mg by mouth 2 (two) times daily.       . rosuvastatin (CRESTOR) 40 MG tablet Take 40 mg by mouth at bedtime.       . sertraline (ZOLOFT) 100 MG tablet Take 150 mg by mouth at bedtime.       . vitamin B-12 (CYANOCOBALAMIN) 500 MCG tablet Take 500 mcg by mouth daily.      Marland Kitchen zolpidem (AMBIEN) 10 MG tablet Take 10 mg by mouth at bedtime. For insomnia       . benzonatate (TESSALON) 100 MG capsule Take 1 capsule (100 mg total) by mouth 2 (two) times daily as needed for cough. (Patient not taking: Reported on 03/06/2020) 20 capsule 0  . lisinopril (ZESTRIL) 20 MG tablet Take 1 tablet (20 mg total) by mouth daily. 90 tablet 1  . triamcinolone ointment (KENALOG) 0.1 % Apply 1 application topically 2 (two) times daily. (Patient not taking: Reported on 03/08/2020) 80 g 1      Home: Home Living Family/patient expects to be discharged to:: Private residence Living Arrangements: Spouse/significant other, Other relatives (57 year old grandson) Available Help at Discharge: Family, Available 24 hours/day Type of Home: House Home Access: Stairs to enter [employer] of Steps: 2 Entrance Stairs-Rails: None Home Layout: Two level, 1/2 bath on main level, Bed/bath upstairs Alternate Level Stairs-Number of Steps: flight Bathroom Shower/Tub: Chiropodist: Standard Home Equipment: None  Functional History: Prior Function Level of Independence: Independent Functional Status:  Mobility: Bed Mobility General bed mobility comments: Not attempted with 1 person due to profound  weakness. Assume +2 total assist Transfers General transfer comment: Not attempted   ADL:   Cognition: Cognition Overall Cognitive Status: Within Functional Limits for tasks assessed Orientation Level: Oriented X4 Cognition Arousal/Alertness: Awake/alert Behavior During Therapy: WFL for tasks assessed/performed Overall Cognitive Status: Within Functional Limits for tasks assessed   Blood pressure (!) 114/58, pulse 60, temperature 98.2 F (36.8 C), temperature source Oral, resp. rate 14, height 6' (1.829 m), weight 98.4 kg, SpO2 94 %. Physical Exam  Vitals reviewed. Constitutional: No distress.  HENT:  Head: Normocephalic and atraumatic.  Respiratory: Effort normal. No stridor.  GI: Soft.  Musculoskeletal:     Cervical back: Normal range of motion.     Comments: No edema or tenderness in extremities  Neurological: He is alert.  Patient is alert in no acute distress.   Oriented x3 and follows commands. Motor: Bilateral upper extremities: Shoulder abduction, elbow flexion/extension 5/5, wrist extension 4+/5, distally 0/5 Lateral lower extremities: 0/5 proximal distal Sensation diminished to light touch distal to bilateral hands  Skin: Skin is warm and dry.  Psychiatric: His behavior is normal. Mood normal.      Lab Results Last 24 Hours  Results for orders placed or performed during the hospital encounter of 03/08/20 (from the past 24 hour(s))  Glucose, capillary     Status: Abnormal    Collection Time: 03/10/20  6:33 AM  Result Value Ref Range    Glucose-Capillary 146 (H) 70 - 99 mg/dL  Glucose, capillary     Status: Abnormal    Collection Time: 03/10/20 11:39 AM  Result Value Ref Range    Glucose-Capillary 140 (H) 70 - 99 mg/dL  Glucose, CSF     Status: Abnormal    Collection Time: 03/10/20  5:15 PM  Result Value Ref Range    Glucose, CSF 77 (H) 40 - 70 mg/dL  Protein, CSF     Status: Abnormal    Collection Time: 03/10/20  5:15 PM  Result Value Ref Range    Total   Protein, CSF 53 (H) 15 - 45 mg/dL  CSF cell count with differential     Status: Abnormal    Collection Time: 03/10/20  5:15 PM  Result Value Ref Range    Tube # 1      Color, CSF COLORLESS COLORLESS    Appearance, CSF CLEAR CLEAR    Supernatant NOT INDICATED      RBC Count, CSF 13 (H) 0 /cu mm    WBC, CSF 1 0 - 5 /cu mm    Other Cells, CSF TOO FEW TO COUNT, SMEAR AVAILABLE FOR REVIEW    Gram stain     Status: None    Collection Time: 03/10/20  5:15 PM    Specimen: PATH Cytology CSF; Cerebrospinal Fluid  Result Value Ref Range    Specimen Description CSF      Special Requests NONE      Gram Stain          WBC PRESENT, PREDOMINANTLY PMN NO ORGANISMS SEEN CYTOSPIN SMEAR Performed at [facility], [address], [place], [place] [address]      Report Status 03/10/2020 FINAL    Glucose, capillary     Status: None    Collection Time: 03/10/20  6:50 PM  Result Value Ref Range    Glucose-Capillary 77 70 - 99 mg/dL  Glucose, capillary     Status: Abnormal    Collection Time: 03/10/20  9:04 PM  Result Value Ref Range    Glucose-Capillary 153 (H) 70 - 99 mg/dL  Comprehensive metabolic panel     Status: Abnormal    Collection Time: 03/11/20  2:22 AM  Result Value Ref Range    Sodium 133 (L) 135 - 145 mmol/L    Potassium 4.3 3.5 - 5.1 mmol/L    Chloride 100 98 - 111 mmol/L    CO2 21 (L) 22 - 32 mmol/L    Glucose, Bld 189 (H) 70 - 99 mg/dL    BUN 60 (H) 8 - 23 mg/dL    Creatinine, Ser 1.74 (H) 0.61 - 1.24 mg/dL    Calcium 8.9 8.9 - 10.3 mg/dL    Total Protein 6.3 (L) 6.5 - 8.1 g/dL    Albumin 3.6 3.5 - 5.0 g/dL    AST 32 15 - 41 U/L    ALT 24 0 - 44 U/L    Alkaline Phosphatase 45 38 - 126 U/L    Total Bilirubin 0.6 0.3 - 1.2 mg/dL    GFR calc non Af Amer 38 (L) >60 mL/min    GFR calc Af Amer 43 (L) >60 mL/min    Anion gap 12 5 - 15  CBC     Status: None    Collection Time: 03/11/20  2:22 AM  Result Value Ref Range    WBC 9.1 4.0 - 10.5 K/uL    RBC 4.59 4.22 - 5.81  MIL/uL    Hemoglobin 13.4 13.0 - 17.0 g/dL    HCT 40.4 39 - 52 %    MCV 88.0 80.0 - 100.0 fL    MCH 29.2 26.0 - 34.0 pg    MCHC 33.2 30.0 - 36.0 g/dL    RDW 13.2 11.5 - 15.5 %    Platelets 183 150 -  400 K/uL    nRBC 0.0 0.0 - 0.2 %       Imaging Results (Last 48 hours)  CT HEAD WO CONTRAST   Result Date: 03/10/2020 CLINICAL DATA:  Periodic paralysis.  Pre lumbar puncture evaluation. EXAM: CT HEAD WITHOUT CONTRAST TECHNIQUE: Contiguous axial images were obtained from the base of the skull through the vertex without intravenous contrast. COMPARISON:  CT head 07/03/2015 FINDINGS: Brain: Ventricle size normal. Mild generalized atrophy. Negative for acute infarct or hemorrhage. Retro cerebellar arachnoid cyst measuring 2.7 x 5.8 cm unchanged from the prior study. Benign cysts in the basal ganglia bilaterally unchanged. Vascular: Negative for hyperdense vessel Skull: No focal skeletal lesion. Sinuses/Orbits: Mucosal edema paranasal sinuses.  Negative orbit. Other: None IMPRESSION: No acute abnormality Retro cerebellar arachnoid cyst is stable. Electronically Signed   By: Franchot Gallo M.D.   On: 03/10/2020 16:22    MR CERVICAL SPINE WO CONTRAST   Addendum Date: 03/10/2020   ADDENDUM REPORT: 03/10/2020 19:12 ADDENDUM: Examination discussed with Dr. Macy Mis, who had previously discussed with Dr. Karena Addison Aroor. On further review, I agree there is abnormal T2-weighted signal intensity in the upper thoracic spinal cord. There is no spinal cord expansion. There are no abnormal flow voids along the surface of the spinal cord. This could indicate a subacute to chronic spinal cord infarction. Demyelinating disease or transverse myelitis are also possibilities. The patient is undergoing a contrast-enhanced cervical and thoracic spine MRI. Electronically Signed   By: Ulyses Jarred M.D.   On: 03/10/2020 19:12    Result Date: 03/10/2020 CLINICAL DATA:  Periodic paralysis EXAM: MRI CERVICAL, THORACIC AND  LUMBAR SPINE WITHOUT CONTRAST TECHNIQUE: Multiplanar and multiecho pulse sequences of the cervical spine, to include the craniocervical junction and cervicothoracic junction, and thoracic and lumbar spine, were obtained without intravenous contrast. COMPARISON:  None. FINDINGS: MRI CERVICAL SPINE FINDINGS Alignment: Physiologic. Vertebrae: No fracture, evidence of discitis, or bone lesion. Cord: Normal signal and morphology. Posterior Fossa, vertebral arteries, paraspinal tissues: Negative. Disc levels: C1-C2: Normal. C2-C3: Small central disc protrusion.  No stenosis. C3-C4: Uncovertebral hypertrophy and right asymmetric disc bulge. Moderate right foraminal stenosis. Moderate right facet hypertrophy. C4-C5: Mild right-greater-than-left facet hypertrophy. Minor disc bulge. Mild right foraminal stenosis. No spinal canal stenosis. C5-C6: Small disc bulge mild bilateral facet hypertrophy. Mild spinal canal stenosis. C6-C7: Small disc bulge with moderate spinal canal stenosis. C7-T1: Small central disc protrusion.  No spinal canal stenosis. MRI THORACIC SPINE FINDINGS Alignment:  Normal Vertebrae: No fracture, evidence of discitis, or bone lesion. Cord:  Normal Paraspinal and other soft tissues: There is a partially cystic lesion within the left renal lower pole with an internal fluid level. Disc levels: No large disc herniation or spinal canal stenosis. MRI LUMBAR SPINE FINDINGS Segmentation:  Standard Alignment:  Grade 1 anterolisthesis at L4-5 Vertebrae:  No fracture, evidence of discitis, or bone lesion. Conus medullaris and cauda equina: Conus extends to the L1 level. Conus and cauda equina appear normal. Paraspinal and other soft tissues: Negative Disc levels: T12-L1: Normal disc space and facets. No spinal canal or neuroforaminal stenosis. L1-L2: Normal disc space and facets. No spinal canal or neuroforaminal stenosis. L2-L3: Normal disc space and facets. No spinal canal or neuroforaminal stenosis. L3-L4: Minimal  disc bulge.  Mild facet hypertrophy.  No stenosis. L4-L5: Moderate right asymmetric disc bulge with moderate bilateral facet hypertrophy. There is right lateral recess stenosis with mild central spinal canal stenosis. Moderate right foraminal stenosis. L5-S1: Severe facet hypertrophy with small central  disc protrusion. No spinal canal stenosis. No neural foraminal stenosis. Visualized sacrum: Normal. IMPRESSION: 1. No acute abnormality of the cervical, thoracic or lumbar spine. 2. Moderate C6-7 and mild C5-6 spinal canal stenosis. 3. Moderate right C3-4 neural foraminal stenosis. 4. Grade 1 anterolisthesis at L4-5 with mild spinal canal and right lateral recess stenosis and moderate right foraminal stenosis. Electronically Signed: By: Ulyses Jarred M.D. On: 03/09/2020 21:17    MR CERVICAL SPINE W CONTRAST   Result Date: 03/10/2020 CLINICAL DATA:  Myelopathy, acute or progressive. EXAM: MRI CERVICAL SPINE WITH CONTRAST TECHNIQUE: Multiplanar, multisequence MR imaging of the cervical spine was performed following the administration of intravenous contrast. COMPARISON:  MRI cervical spine without contrast yesterday FINDINGS: Normal enhancement following contrast infusion. No enhancing vertebral lesion. No enhancing lesion in the cord. Paraspinous negative for soft tissue mass or abnormal enhancement. Multilevel degenerative change in the cervical spine as described on the report from yesterday. IMPRESSION: Normal enhancement of the cervical spine and cord. No mass lesion identified. Cervical spondylosis. Electronically Signed   By: Franchot Gallo M.D.   On: 03/10/2020 18:55    MR THORACIC SPINE WO CONTRAST   Addendum Date: 03/10/2020   ADDENDUM REPORT: 03/10/2020 19:12 ADDENDUM: Examination discussed with Dr. Macy Mis, who had previously discussed with Dr. Karena Addison Aroor. On further review, I agree there is abnormal T2-weighted signal intensity in the upper thoracic spinal cord. There is no spinal cord  expansion. There are no abnormal flow voids along the surface of the spinal cord. This could indicate a subacute to chronic spinal cord infarction. Demyelinating disease or transverse myelitis are also possibilities. The patient is undergoing a contrast-enhanced cervical and thoracic spine MRI. Electronically Signed   By: Ulyses Jarred M.D.   On: 03/10/2020 19:12    Result Date: 03/10/2020 CLINICAL DATA:  Periodic paralysis EXAM: MRI CERVICAL, THORACIC AND LUMBAR SPINE WITHOUT CONTRAST TECHNIQUE: Multiplanar and multiecho pulse sequences of the cervical spine, to include the craniocervical junction and cervicothoracic junction, and thoracic and lumbar spine, were obtained without intravenous contrast. COMPARISON:  None. FINDINGS: MRI CERVICAL SPINE FINDINGS Alignment: Physiologic. Vertebrae: No fracture, evidence of discitis, or bone lesion. Cord: Normal signal and morphology. Posterior Fossa, vertebral arteries, paraspinal tissues: Negative. Disc levels: C1-C2: Normal. C2-C3: Small central disc protrusion.  No stenosis. C3-C4: Uncovertebral hypertrophy and right asymmetric disc bulge. Moderate right foraminal stenosis. Moderate right facet hypertrophy. C4-C5: Mild right-greater-than-left facet hypertrophy. Minor disc bulge. Mild right foraminal stenosis. No spinal canal stenosis. C5-C6: Small disc bulge mild bilateral facet hypertrophy. Mild spinal canal stenosis. C6-C7: Small disc bulge with moderate spinal canal stenosis. C7-T1: Small central disc protrusion.  No spinal canal stenosis. MRI THORACIC SPINE FINDINGS Alignment:  Normal Vertebrae: No fracture, evidence of discitis, or bone lesion. Cord:  Normal Paraspinal and other soft tissues: There is a partially cystic lesion within the left renal lower pole with an internal fluid level. Disc levels: No large disc herniation or spinal canal stenosis. MRI LUMBAR SPINE FINDINGS Segmentation:  Standard Alignment:  Grade 1 anterolisthesis at L4-5 Vertebrae:  No  fracture, evidence of discitis, or bone lesion. Conus medullaris and cauda equina: Conus extends to the L1 level. Conus and cauda equina appear normal. Paraspinal and other soft tissues: Negative Disc levels: T12-L1: Normal disc space and facets. No spinal canal or neuroforaminal stenosis. L1-L2: Normal disc space and facets. No spinal canal or neuroforaminal stenosis. L2-L3: Normal disc space and facets. No spinal canal or neuroforaminal stenosis. L3-L4: Minimal disc bulge.  Mild facet hypertrophy.  No stenosis. L4-L5: Moderate right asymmetric disc bulge with moderate bilateral facet hypertrophy. There is right lateral recess stenosis with mild central spinal canal stenosis. Moderate right foraminal stenosis. L5-S1: Severe facet hypertrophy with small central disc protrusion. No spinal canal stenosis. No neural foraminal stenosis. Visualized sacrum: Normal. IMPRESSION: 1. No acute abnormality of the cervical, thoracic or lumbar spine. 2. Moderate C6-7 and mild C5-6 spinal canal stenosis. 3. Moderate right C3-4 neural foraminal stenosis. 4. Grade 1 anterolisthesis at L4-5 with mild spinal canal and right lateral recess stenosis and moderate right foraminal stenosis. Electronically Signed: By: Ulyses Jarred M.D. On: 03/09/2020 21:17    MR THORACIC SPINE W CONTRAST   Result Date: 03/10/2020 CLINICAL DATA:  Myelopathy, acute or progressive. EXAM: MRI THORACIC SPINE WITH CONTRAST TECHNIQUE: Multiplanar, multisequence MR imaging of the thoracic spine was performed following the administration of intravenous contrast. COMPARISON:  MRI thoracic spine without contrast yesterday CONTRAST:  9.8 mL Gadovist IV FINDINGS: 6 x 10 mm enhancing lesion in the T9 vertebral body on the left located posteriorly. This is low signal on T1 and high signal on T2 on yesterday's study and shows fairly homogeneous enhancement. This lesion is not visible on recent chest CT of 03/08/2020. No fracture or extraosseous extension. No other  bone marrow lesions identified. Normal enhancement of the spinal cord.  No cord mass identified. Paraspinous soft tissues enhance normally. IMPRESSION: 6 x 10 mm enhancing lesion left T9 vertebral body. This appearance is nonspecific and could be seen with benign lesion such as atypical hemangioma versus neoplasm such as metastatic disease. The patient has history of melanoma. There is no cord compression or cause for myelopathy identified. Electronically Signed   By: Franchot Gallo M.D.   On: 03/10/2020 19:06    MR LUMBAR SPINE WO CONTRAST   Addendum Date: 03/10/2020   ADDENDUM REPORT: 03/10/2020 19:12 ADDENDUM: Examination discussed with Dr. Macy Mis, who had previously discussed with Dr. Karena Addison Aroor. On further review, I agree there is abnormal T2-weighted signal intensity in the upper thoracic spinal cord. There is no spinal cord expansion. There are no abnormal flow voids along the surface of the spinal cord. This could indicate a subacute to chronic spinal cord infarction. Demyelinating disease or transverse myelitis are also possibilities. The patient is undergoing a contrast-enhanced cervical and thoracic spine MRI. Electronically Signed   By: Ulyses Jarred M.D.   On: 03/10/2020 19:12    Result Date: 03/10/2020 CLINICAL DATA:  Periodic paralysis EXAM: MRI CERVICAL, THORACIC AND LUMBAR SPINE WITHOUT CONTRAST TECHNIQUE: Multiplanar and multiecho pulse sequences of the cervical spine, to include the craniocervical junction and cervicothoracic junction, and thoracic and lumbar spine, were obtained without intravenous contrast. COMPARISON:  None. FINDINGS: MRI CERVICAL SPINE FINDINGS Alignment: Physiologic. Vertebrae: No fracture, evidence of discitis, or bone lesion. Cord: Normal signal and morphology. Posterior Fossa, vertebral arteries, paraspinal tissues: Negative. Disc levels: C1-C2: Normal. C2-C3: Small central disc protrusion.  No stenosis. C3-C4: Uncovertebral hypertrophy and right  asymmetric disc bulge. Moderate right foraminal stenosis. Moderate right facet hypertrophy. C4-C5: Mild right-greater-than-left facet hypertrophy. Minor disc bulge. Mild right foraminal stenosis. No spinal canal stenosis. C5-C6: Small disc bulge mild bilateral facet hypertrophy. Mild spinal canal stenosis. C6-C7: Small disc bulge with moderate spinal canal stenosis. C7-T1: Small central disc protrusion.  No spinal canal stenosis. MRI THORACIC SPINE FINDINGS Alignment:  Normal Vertebrae: No fracture, evidence of discitis, or bone lesion. Cord:  Normal Paraspinal and other soft tissues: There is a  partially cystic lesion within the left renal lower pole with an internal fluid level. Disc levels: No large disc herniation or spinal canal stenosis. MRI LUMBAR SPINE FINDINGS Segmentation:  Standard Alignment:  Grade 1 anterolisthesis at L4-5 Vertebrae:  No fracture, evidence of discitis, or bone lesion. Conus medullaris and cauda equina: Conus extends to the L1 level. Conus and cauda equina appear normal. Paraspinal and other soft tissues: Negative Disc levels: T12-L1: Normal disc space and facets. No spinal canal or neuroforaminal stenosis. L1-L2: Normal disc space and facets. No spinal canal or neuroforaminal stenosis. L2-L3: Normal disc space and facets. No spinal canal or neuroforaminal stenosis. L3-L4: Minimal disc bulge.  Mild facet hypertrophy.  No stenosis. L4-L5: Moderate right asymmetric disc bulge with moderate bilateral facet hypertrophy. There is right lateral recess stenosis with mild central spinal canal stenosis. Moderate right foraminal stenosis. L5-S1: Severe facet hypertrophy with small central disc protrusion. No spinal canal stenosis. No neural foraminal stenosis. Visualized sacrum: Normal. IMPRESSION: 1. No acute abnormality of the cervical, thoracic or lumbar spine. 2. Moderate C6-7 and mild C5-6 spinal canal stenosis. 3. Moderate right C3-4 neural foraminal stenosis. 4. Grade 1 anterolisthesis at  L4-5 with mild spinal canal and right lateral recess stenosis and moderate right foraminal stenosis. Electronically Signed: By: Ulyses Jarred M.D. On: 03/09/2020 21:17    NM Myocar Multi W/Spect W/Wall Motion / EF   Result Date: 03/09/2020 CLINICAL DATA:  Recent onset of chest pain. History diabetes, remote myocardial infarction and coronary stenting. EXAM: MYOCARDIAL IMAGING WITH SPECT (REST AND PHARMACOLOGIC-STRESS) GATED LEFT VENTRICULAR WALL MOTION STUDY LEFT VENTRICULAR EJECTION FRACTION TECHNIQUE: Standard myocardial SPECT imaging was performed after resting intravenous injection of 11 mCi Tc-29m tetrofosmin. Subsequently, intravenous infusion of Lexiscan was performed under the supervision of the Cardiology staff. At peak effect of the drug, 29 mCi Tc-22m tetrofosmin was injected intravenously and standard myocardial SPECT imaging was performed. Quantitative gated imaging was also performed to evaluate left ventricular wall motion, and estimate left ventricular ejection fraction. COMPARISON:  Images from nuclear medicine myocardial perfusion study 09/25/2013 without report FINDINGS: Perfusion: No decreased activity in the left ventricle on stress imaging to suggest reversible ischemia or infarction. Mildly prominent subdiaphragmatic activity on the rest images. Wall Motion: Normal left ventricular wall motion. No left ventricular dilation. Left Ventricular Ejection Fraction: 53 % End diastolic volume 91 ml End systolic volume 43 ml IMPRESSION: 1. No reversible ischemia or infarction. 2. Normal left ventricular wall motion. 3. Left ventricular ejection fraction 53% 4. Non invasive risk stratification*: Low *2012 Appropriate Use Criteria for Coronary Revascularization Focused Update: J Am Coll Cardiol. 3976;73(4):193-790. http://content.airportbarriers.com.aspx?articleid=[email or web address] Electronically Signed   By: Richardean Sale M.D.   On: 03/09/2020 16:43    DG FL GUIDED LUMBAR PUNCTURE   Result Date:  03/10/2020 CLINICAL DATA:  Paraplegia. EXAM: DIAGNOSTIC LUMBAR PUNCTURE UNDER FLUOROSCOPIC GUIDANCE FLUOROSCOPY TIME:  Fluoroscopy Time:  2 minutes, 36 seconds Radiation Exposure Index (if provided by the fluoroscopic device): 73.0 mGy Number of Acquired Spot Images: 5 PROCEDURE: Informed consent was obtained from the patient prior to the procedure, including potential complications of headache, allergy, and pain. With the patient prone, the lower back was prepped with Betadine. 1% Lidocaine was used for local anesthesia. Lumbar puncture was performed at the L2-L3 level using a 20 gauge needle with return of clear CSF. 12 ml of CSF were obtained for laboratory studies. The patient tolerated the procedure well and there were no immediate postprocedure complications. IMPRESSION: Technically successful fluoroscopically guided L2-L3 lumbar  puncture. No immediate postprocedure complication. 12 mL of CSF were obtained for laboratory studies. Electronically Signed   By: Kellie Simmering DO   On: 03/10/2020 17:18       Assessment/Plan: Diagnosis: Bilateral hand and lower extremity weakness Labs and images (see above) independently reviewed.  Records reviewed and summated above.   1. Does the need for close, 24 hr/day medical supervision in concert with the patient's rehab needs make it unreasonable for this patient to be served in a less intensive setting? Yes  2. 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 Due to bladder management, bowel management, safety, skin/wound care, disease management, medication administration  and patient education, does the patient require 24 hr/day rehab nursing? Yes 4. Does the patient require coordinated care of a physician, rehab nurse, therapy disciplines of PT/OT to address physical and functional deficits in the context of the above medical diagnosis(es)? Yes Addressing deficits in the following areas: balance, endurance, locomotion, strength, transferring, bathing, dressing, toileting and psychosocial support 5. Can the patient actively participate in an intensive therapy program of at least 3 hrs of therapy per day at least 5 days per week? Yes 6. The potential for patient to make measurable gains while on inpatient rehab is excellent 7. Anticipated functional outcomes upon discharge from inpatient rehab are min assist and mod assist  with PT, min assist and mod assist with OT, n/a with SLP. 8. Estimated rehab length of stay to reach the above functional goals is: 18-23 days. 9. Anticipated discharge destination: Home 10. Overall Rehab/Functional Prognosis: good   RECOMMENDATIONS: This patient's condition is appropriate for continued rehabilitative care in the following setting: Await completion of medical work-up.  Recommend CIR if amenable diagnosis. Patient has agreed to participate in recommended program. Yes Note that insurance prior authorization may be required for reimbursement for recommended care.   Comment: Rehab Admissions Coordinator to follow up.   I have personally performed a face to face diagnostic evaluation, including, but not limited to relevant history and physical exam findings, of this patient and developed relevant assessment and plan.  Additionally, I have reviewed and concur with the physician assistant's documentation above.    Delice Lesch, MD, ABPMR Cathlyn Parsons, PA-C 03/11/2020        Revision History                     Routing History           Note Details  Author Brent Arn, MD File Time 03/11/2020  2:23 PM  Author  Type Physician Status Signed  Last Editor Brent Arn, MD Service [facility] # [phone] Admit Date 03/25/2020

## 2020-03-25 NOTE — Progress Notes (Addendum)
Inpatient Rehabilitation Medication Review by a Pharmacist  A complete drug regimen review was completed for this patient to identify any potential clinically significant medication issues.  Clinically significant medication issues were identified:  yes   Type of Medication Issue Identified Description of Issue Urgent (address now) Non-Urgent (address on AM team rounds) Plan Plan Accepted by Provider? (Yes / No / Pending AM Rounds)  Drug Interaction(s) (clinically significant)       Duplicate Therapy       Allergy       No Medication Administration End Date       Incorrect Dose       Additional Drug Therapy Needed  Home meds restarted on discharge summary. Need to clarify if intentionally not restarting the following home medications upon rehab admission: metformin, empagliflozin  Non-urgent MD will just hold off and watch after discussion this AM Yes  Other         For non-urgent medication issues to be resolved on team rounds tomorrow morning a [facility] Secure Chat Handoff was sent to: Dr Posey Pronto   Pharmacist comments: no urgent medication issues identified  Time spent performing this drug regimen review (minutes):  [address] PharmD, [place], AAHIVP, CPP Infectious Disease Pharmacist 03/26/2020 8:43 AM

## 2020-03-25 NOTE — H&P (Signed)
Physical Medicine and Rehabilitation Admission H&P    Chief Complaint  Patient presents with  . Chest Pain  : HPI: Brent [name]. Berkovich is a 74 year old right-handed male with history of remote MI stenting x3 in the 1990s maintained on aspirin, diabetes mellitus with neuropathy, hyperlipidemia, obesity with BMI 29.42.  Per chart review lives with spouse and 51 year old grandson.  History taken from chart review and patient.  Reportedly independent prior to admission.  Two-level home half bath on main level bedroom upstairs.  He presented on 02/22/2020 with chest pain to his back associated shortness of breath and diaphoresis as well as reported some right leg weakness then quickly had bilateral lower extremity weakness as well as numbness in both hands.  Troponin unremarkable and chemistries within normal limits except glucose 229.  Chest x-ray no acute process.  CT angiogram of chest negative for aortic aneurysm or dissection.  No acute findings in the chest abdomen or pelvis.  MRI thoracic spine showed a 6 x 10 mm enhancing lesion at left T9 vertebral body appeared nonspecific.  No cord compression or cause for myelopathy identified.  MRI cervical spine normal enhancement no mass lesion.  Lumbar puncture CSF cell count 1, protein 53, IgG index normal.  Cranial CT/MRI scan unremarkable for acute intracranial process.  Neurology follow-up initial work-up for transverse myelitis however work-up had not been indicated of of that.  He was placed on high-dose IV Solu-Medrol x5 days starting 03/10/2020.  He completed course of steroids 03/14/2020.  Patient suddenly had acute shortness of breath respiratory distress around 5:45 PM on the evening of 03/15/2020.  Rapid response was called he was moved to the ICU.  Patient initially required nonrebreather.  Chest x-ray and abdominal films were done chest x-ray showed possibly infiltrates or atelectasis.  MRI cervical spine T-spine repeated by neurology which showed  progressive cord edema extending from C4-5 to T4-5 with mild patchiness seen.  Progression again favored myelitis as well as NMO came back positive with serum MOG antibody negative and serum paraneoplastic panel also pending.  He was again started on IVIG 03/16/2020 and course has been completed.  Hospital course further complicated by HCAP and he was started on Maxipime for HCAP .  During hospital course and work-up cardiology services consulted in regards to possible need for possibly atrial fibrillation being a contributor to spinal cord infarction recommendations were for 30-day cardiac event monitor which should be arranged at discharge and no further recommendations were made.  Please see preadmission assessment from earlier today as well.  Review of Systems  Constitutional: Negative for chills and fever.  HENT: Negative for hearing loss.   Eyes: Negative for blurred vision and double vision.  Respiratory: Negative for cough and shortness of breath.   Cardiovascular: Negative for chest pain and palpitations.  Gastrointestinal: Positive for constipation. Negative for heartburn, nausea and vomiting.  Genitourinary: Negative for dysuria, flank pain and hematuria.  Musculoskeletal: Positive for joint pain and myalgias.  Skin: Negative for rash.  Neurological: Positive for focal weakness.  Psychiatric/Behavioral: Positive for depression.  All other systems reviewed and are negative.  Past Medical History:  Diagnosis Date  . Allergy   . CAD (coronary artery disease)    a. angioplasty of his RCA in 1990. b. bare metal stent placed in the RCA in 2000 followed by rotational atherectomy shortly after for stent restenosis. c. last cath was in 2012 showing stable moderate diffuse CAD. (70% mid LAD, 80% diagonal, 70% Ramus, 40% mid  to distal RCA stent restenosis). d. Low risk nuc in 2015.  . Diabetes mellitus   . Diverticulosis   . Elevated CK   . Erectile dysfunction   . Hemorrhoids   . HTN  (hypertension)   . Hyperlipidemia   . Hypertriglyceridemia   . Malignant melanoma of left side of neck ([place]) 10/25/2018  . Myocardial infarction ([place])   . Obesity   . OSA (obstructive sleep apnea)   . Persistent disorder of initiating or maintaining sleep   . Personal history of colonic polyps 02/05/2003   Past Surgical History:  Procedure Laterality Date  . CHOLECYSTECTOMY    . CORONARY ANGIOPLASTY    . CORONARY STENT PLACEMENT     stenting of the right coronary artery with followup rotational  atherectomy. (3 stents placed)  . FINGER SURGERY     right  . FOOT SURGERY     right  . INGUINAL HERNIA REPAIR     right  . melanoma removal     neck  . ORTHOPEDIC SURGERY     foot right  . rotator cuff surg     Bil   Family History  Problem Relation Age of Onset  . Liver cancer Father   . Lung cancer Father   . Heart disease Father   . Heart disease Sister   . Lung cancer Mother   . COPD Mother   . Obesity Mother   . Colon cancer Neg Hx   . Stomach cancer Neg Hx   . Pancreatic cancer Neg Hx    Social History:  reports that he quit smoking about 39 years ago. His smoking use included cigarettes. He has a 45.00 pack-year smoking history. He has never used smokeless tobacco. He reports current alcohol use of about 7.0 - 14.0 standard drinks of alcohol per week. He reports that he does not use drugs. Allergies:  Allergies  Allergen Reactions  . Levofloxacin Rash  . Penicillins Hives and Rash   Medications Prior to Admission  Medication Sig Dispense Refill  . aspirin EC 81 MG tablet Take 81 mg by mouth at bedtime. Swallow whole.    . empagliflozin (JARDIANCE) 25 MG TABS tablet Take 12.5 mg by mouth daily.    . fenofibrate 160 MG tablet Take 160 mg by mouth at bedtime.     . fluticasone (FLONASE) 50 MCG/ACT nasal spray SPRAY 2 SPRAYS INTO EACH NOSTRIL EVERY DAY (Patient taking differently: Place 2 sprays into both nostrils at bedtime. ) 48 mL 3  . ibuprofen (ADVIL) 200 MG  tablet Take 600 mg by mouth daily as needed for headache (pain).    [name] Ethyl (VASCEPA) 1 g CAPS Take 2 capsules (2 g total) by mouth 2 (two) times daily. 120 capsule 3  . insulin aspart (NOVOLOG FLEXPEN) 100 UNIT/ML FlexPen Inject 25-30 Units into the skin 3 (three) times daily before meals.     Marland Kitchen lisinopril (ZESTRIL) 10 MG tablet Take 10 mg by mouth daily. For kidney protection    . LORazepam (ATIVAN) 1 MG tablet TAKE 1 TABLET BY MOUTH TWICE A DAY AS NEEDED FOR ANXIETY (Patient taking differently: Take 1 mg by mouth See admin instructions. Take one tablet (1 mg) by mouth daily at bedtime, may also take one tablet (1 mg) during the day as needed for anxiety) 180 tablet 0  . metFORMIN (GLUCOPHAGE) 1000 MG tablet Take 1,000 mg by mouth 2 (two) times daily with a meal.    . Multiple Vitamin (MULTIVITAMIN WITH MINERALS)  TABS tablet Take 1 tablet by mouth daily. Centrum Silver Men's    . oxybutynin (DITROPAN) 5 MG tablet Take 5 mg by mouth 2 (two) times daily.     . rosuvastatin (CRESTOR) 40 MG tablet Take 40 mg by mouth at bedtime.     . sertraline (ZOLOFT) 100 MG tablet Take 150 mg by mouth at bedtime.     . vitamin B-12 (CYANOCOBALAMIN) 500 MCG tablet Take 500 mcg by mouth daily.    Marland Kitchen zolpidem (AMBIEN) 10 MG tablet Take 10 mg by mouth at bedtime. For insomnia     . [DISCONTINUED] insulin glargine (LANTUS) 100 unit/mL SOPN Inject 60 Units into the skin at bedtime.     . benzonatate (TESSALON) 100 MG capsule Take 1 capsule (100 mg total) by mouth 2 (two) times daily as needed for cough. (Patient not taking: Reported on 03/06/2020) 20 capsule 0  . lisinopril (ZESTRIL) 20 MG tablet Take 1 tablet (20 mg total) by mouth daily. 90 tablet 1  . triamcinolone ointment (KENALOG) 0.1 % Apply 1 application topically 2 (two) times daily. (Patient not taking: Reported on 03/08/2020) 80 g 1    Drug Regimen Review Drug regimen was reviewed and remains appropriate with no significant issues  identified  Home: Home Living Family/patient expects to be discharged to:: Private residence Living Arrangements: Spouse/significant other, Other relatives (19 y/o grandson) Available Help at Discharge: Family, Available 24 hours/day Type of Home: House Home Access: Stairs to enter [employer] of Steps: 2 Entrance Stairs-Rails: None Home Layout: Two level, 1/2 bath on main level, Bed/bath upstairs Alternate Level Stairs-Number of Steps: flight Bathroom Shower/Tub: Chiropodist: Standard Home Equipment: None   Functional History: Prior Function Level of Independence: Independent  Functional Status:  Mobility: Bed Mobility Overal bed mobility: Needs Assistance Bed Mobility: Rolling, Sidelying to Sit Rolling: Mod assist Sidelying to sit: Total assist, +2 for safety/equipment Supine to sit: Total assist, +2 for physical assistance, +2 for safety/equipment Sit to supine: +2 for physical assistance, Total assist General bed mobility comments: in good supported sitting position for feeding task in b ed Transfers Overall transfer level: Needs assistance Equipment used: Ambulation equipment used Transfer via Lift Equipment: Maximove Transfers: Public house manager Squat pivot transfers: Max assist, +2 physical assistance General transfer comment: unable to attempt today due to increased fatigue and incontinence of stool  Ambulation/Gait Ambulation/Gait assistance:  (pt unable)    ADL: ADL Overall ADL's : Needs assistance/impaired Eating/Feeding: Set up, Bed level, With adaptive utensils Eating/Feeding Details (indicate cue type and reason): Pt feeding self 50% of meal with adapted/built up fork. Pt able to use LUE to feed self bagel after butter applied. Pt required coffee to be poured into styrofoam cup. Pt seen for 2nd session to administer 2nd red tubing for self feeding and for NT to be present to encourage self feeding/rdrinking. Grooming:  Minimal assistance, Bed level Grooming Details (indicate cue type and reason): educated pt on using u cuff for brushing teeth, as well as compensating with electric toothbrush Upper Body Bathing: Maximal assistance, Sitting, Bed level Lower Body Bathing: Total assistance, Sitting/lateral leans, Sit to/from stand, Bed level Upper Body Dressing : Sitting, Bed level, Maximal assistance Lower Body Dressing: Total assistance, Sitting/lateral leans, Sit to/from stand, Bed level Toilet Transfer: Total assistance Toilet Transfer Details (indicate cue type and reason): assumes full extensor tone when taken EOB Toileting- Clothing Manipulation and Hygiene: Total assistance General ADL Comments: Pt session focused on self feeding and NT to  observe to encourage self feed  Cognition: Cognition Overall Cognitive Status: Within Functional Limits for tasks assessed Orientation Level: Oriented X4 Cognition Arousal/Alertness: Awake/alert Behavior During Therapy: WFL for tasks assessed/performed Overall Cognitive Status: Within Functional Limits for tasks assessed General Comments: pt continues to be optimistic and agreeable to therapy   Physical Exam: Blood pressure (!) 143/56, pulse (!) 51, temperature 97.6 F (36.4 C), temperature source Axillary, resp. rate 20, height 6' (1.829 m), weight 108.6 kg, SpO2 96 %. Physical Exam Vitals reviewed.  Constitutional:      Appearance: He is well-developed. He is obese.  HENT:     Head: Normocephalic and atraumatic.     Comments: Right external ear normal Left external ear normal Eyes:     Extraocular Movements: Extraocular movements intact.  Cardiovascular:     Rate and Rhythm: Normal rate and regular rhythm.  Pulmonary:     Effort: Pulmonary effort is normal. No tachypnea.     Breath sounds: No stridor.  Abdominal:     Palpations: Abdomen is soft.     Comments: Nondistended  Musculoskeletal:     Cervical back: Neck supple.     Comments: No edema or  tenderness in extremities  Skin:    General: Skin is warm and dry.  Neurological:     Mental Status: He is alert.     Comments: Patient is alert in no acute distress.   Oriented x3 and follows commands.   Cooperative with exam Sensation intact to light touch Motor:  RUE: Shoulder abduction, elbow flexion 5/5, wrist extension 4+/5, elbow ext 4+/5, hand grip 1+/5 LUE: Shoulder abduction, elbow flexion 5/5, wrist extension 4+/5, elbow ext 3+/5, hand grip 1/5 B/l LE: 0/5 proxima to distal  Psychiatric:        Mood and Affect: Mood normal.        Behavior: Behavior normal.     Results for orders placed or performed during the hospital encounter of 03/08/20 (from the past 48 hour(s))  Glucose, capillary     Status: Abnormal   Collection Time: 03/23/20 12:10 PM  Result Value Ref Range   Glucose-Capillary 191 (H) 70 - 99 mg/dL    Comment: Glucose reference range applies only to samples taken after fasting for at least 8 hours.   Comment 1 Notify RN    Comment 2 Document in Chart   Glucose, capillary     Status: Abnormal   Collection Time: 03/23/20  5:12 PM  Result Value Ref Range   Glucose-Capillary 202 (H) 70 - 99 mg/dL    Comment: Glucose reference range applies only to samples taken after fasting for at least 8 hours.   Comment 1 Notify RN    Comment 2 Document in Chart   Glucose, capillary     Status: Abnormal   Collection Time: 03/23/20  9:10 PM  Result Value Ref Range   Glucose-Capillary 232 (H) 70 - 99 mg/dL    Comment: Glucose reference range applies only to samples taken after fasting for at least 8 hours.  Glucose, capillary     Status: Abnormal   Collection Time: 03/24/20  7:45 AM  Result Value Ref Range   Glucose-Capillary 125 (H) 70 - 99 mg/dL    Comment: Glucose reference range applies only to samples taken after fasting for at least 8 hours.  Glucose, capillary     Status: Abnormal   Collection Time: 03/24/20  1:07 PM  Result Value Ref Range   Glucose-Capillary  160 (H) 70 -  99 mg/dL    Comment: Glucose reference range applies only to samples taken after fasting for at least 8 hours.  Glucose, capillary     Status: Abnormal   Collection Time: 03/24/20  5:32 PM  Result Value Ref Range   Glucose-Capillary 200 (H) 70 - 99 mg/dL    Comment: Glucose reference range applies only to samples taken after fasting for at least 8 hours.  Glucose, capillary     Status: Abnormal   Collection Time: 03/24/20  9:10 PM  Result Value Ref Range   Glucose-Capillary 249 (H) 70 - 99 mg/dL    Comment: Glucose reference range applies only to samples taken after fasting for at least 8 hours.  Basic metabolic panel     Status: Abnormal   Collection Time: 03/25/20  7:53 AM  Result Value Ref Range   Sodium 135 135 - 145 mmol/L   Potassium 4.2 3.5 - 5.1 mmol/L   Chloride 106 98 - 111 mmol/L   CO2 22 22 - 32 mmol/L   Glucose, Bld 130 (H) 70 - 99 mg/dL    Comment: Glucose reference range applies only to samples taken after fasting for at least 8 hours.   BUN 29 (H) 8 - 23 mg/dL   Creatinine, Ser 0.95 0.61 - 1.24 mg/dL   Calcium 8.6 (L) 8.9 - 10.3 mg/dL   GFR calc non Af Amer >60 >60 mL/min   GFR calc Af Amer >60 >60 mL/min   Anion gap 7 5 - 15    Comment: Performed at [facility] [address], [place], [place] [address]  CBC with Differential/Platelet     Status: Abnormal   Collection Time: 03/25/20  7:53 AM  Result Value Ref Range   WBC 4.4 4.0 - 10.5 K/uL   RBC 3.89 (L) 4.22 - 5.81 MIL/uL   Hemoglobin 11.1 (L) 13.0 - 17.0 g/dL   HCT 35.5 (L) 39 - 52 %   MCV 91.3 80.0 - 100.0 fL   MCH 28.5 26.0 - 34.0 pg   MCHC 31.3 30.0 - 36.0 g/dL   RDW 13.7 11.5 - 15.5 %   Platelets 269 150 - 400 K/uL   nRBC 0.0 0.0 - 0.2 %   Neutrophils Relative % 78 %   Neutro Abs 3.4 1.7 - 7.7 K/uL   Lymphocytes Relative 14 %   Lymphs Abs 0.6 (L) 0.7 - 4.0 K/uL   Monocytes Relative 5 %   Monocytes Absolute 0.2 0 - 1 K/uL   Eosinophils Relative 2 %   Eosinophils Absolute 0.1  0 - 0 K/uL   Basophils Relative 0 %   Basophils Absolute 0.0 0 - 0 K/uL   Immature Granulocytes 1 %   Abs Immature Granulocytes 0.03 0.00 - 0.07 K/uL    Comment: Performed at [facility], [place] [address], [place], [place] [address]  Glucose, capillary     Status: Abnormal   Collection Time: 03/25/20  8:01 AM  Result Value Ref Range   Glucose-Capillary 116 (H) 70 - 99 mg/dL    Comment: Glucose reference range applies only to samples taken after fasting for at least 8 hours.  Glucose, capillary     Status: Abnormal   Collection Time: 03/25/20 10:58 AM  Result Value Ref Range   Glucose-Capillary 197 (H) 70 - 99 mg/dL    Comment: Glucose reference range applies only to samples taken after fasting for at least 8 hours.   No results found.  Medical Problem List and Plan: 1.  Quadriparesis secondary to spinal cord infarct versus transverse myelitis/NMO.  IV IG completed 03/20/2020 as well as received Rituxan dose 03/24/2020.  -patient may shower  -ELOS/Goals: 27-30 days/Mod A  Admit to CIR 2.  Antithrombotics: -DVT/anticoagulation: Lovenox     Vascular study ordered  -antiplatelet therapy: Aspirin 81 mg daily 3. Pain Management: Lidoderm patch, tramadol as needed 4. Mood: Zoloft 150 mg nightly, Ativan as needed  -antipsychotic agents: N/A 5. Neuropsych: This patient is capable of making decisions on his own behalf. 6. Skin/Wound Care: Routine skin checks 7. Fluids/Electrolytes/Nutrition: Routine in and outs. CMP ordered 8.  Steroid-induced hyperglycemia on diabetes mellitus.  Hemoglobin A1c 6.7.  Lantus insulin 30 units daily  Monitor with increased mobility 9. HCAP/OSA.  Completed course of Maxipime initiated 03/18/2020 x14 doses.  Continue BiPAP 10.  Hyperlipidemia. Lipitor/Vascepa 11.  Hypertension.  Lisinopril 20 mg daily  Monitor with increased mobility  Cathlyn Parsons, PA-C 03/25/2020  I have personally performed a face to face diagnostic evaluation, including, but  not limited to relevant history and physical exam findings, of this patient and developed relevant assessment and plan.  Additionally, I have reviewed and concur with the physician assistant's documentation above.  Delice Lesch, MD, ABPMR  The patient's status has not changed. The original post admission physician evaluation remains appropriate, and any changes from the pre-admission screening or documentation from the acute chart are noted above.   Delice Lesch, MD, ABPMR

## 2020-03-25 NOTE — Progress Notes (Signed)
Inpatient Rehabilitation-Admissions Coordinator   Received medical clearance from Dr. Tawanna Solo for admit to CIR today. Notified pt of bed offer and he has accepted. Reviewed insurance benefits letter and consent forms. All questions answered. RN and TOC notified of plan for today.   Please call if questions.   Raechel Ache, OTR/L  Rehab Admissions Coordinator  [phone] 03/25/2020 11:01 AM

## 2020-03-25 NOTE — Progress Notes (Signed)
NIF > -40, VC 1.5L.  Pt gave excellent effort.

## 2020-03-25 NOTE — Discharge Summary (Signed)
Physician Discharge Summary  Brent [name]. OVF:[id number] DOB: 12-14-1945 DOA: 03/08/2020  PCP: [name], [name], MD  Admit date: 03/08/2020 Discharge date: 03/25/2020  Admitted From: Home Disposition:  CIR  Discharge Condition:Stable CODE STATUS:FULL Diet recommendation: Heart Health  Brief/Interim Summary:  Patient is a 74 year old male with history of coronary artery disease status post stenting, IDDM, diabetic neuropathy, sciatica, hypertension, hyperlipidemia who presented with chest pain.  Troponins were negative.  CT dissection study was negative.  Cardiology was consulted.  He underwent nuclear stress test which was unremarkable.  He then he started having weakness on left lower extremity as well as decreased ability with strength on bilateral hands.  Neurology consulted.  MRI spine showed subtle signal changes in cervical, thoracic spine.  T-spine MRI showed 6/10 mm enhancing lesion in the left T9 vertebral body.  LP was unremarkable.  Initial working diagnosis is transverse myelitis as per neurology but work up was not 3 conclusive.  Patient was started on high-dose Solu-Medrol and it was given for 5 days.   MRI C-spine and T-spine were repeated by neurology in 03/08/2020 which showed progressive cord edema extending from C4-5 to T4-5 favoring myelitis.  He was also started on IVIG and he completed the course without improvement. Patient suddenly developed acute shortness of breath/respiratory/on evening of 03/15/2020 and was moved to ICU.  Chest x-ray showed multifocal pneumonia.  Currently respiratory status is stable. Since NMO is positive, neurology strongly suspecting neuromyelitis optica so he was given a dose of Rituxan on 03/24/20. PT/OT recommended CIR on discharge.  Following problems were addressed during his hospitalization:   Transverse myelitis/cord infarction: Started with abrupt onset of right lower extremity flaccid weakness with preservation of sensation.   The weakness spread to the left lower extremity on 6/21 and distal bilateral upper extremity weakness was noted shortly afterwards.  MRI revealed abnormal signal in the anterior cervical and thoracic spinal cord with gray matter being primarily affected, consistent with anterior spinal cord infarction.  Demyelinating disease and transverse myelitis were on the differentials.  Patient was treated with 5 days of IV methylprednisolone with improvement.  Completed 5 days course of IVIG.  LP was done and CSF cell count, protein, IgG index were normal.  Normal vitamin B12, ceruloplasmin was normal, folate was normal.  Negative SSA/SSB, ACE, ANA, normal vitamin B1.   MRI brain was negative for acute infarction or demyelination. Patient continues to have bilateral lower extremity severe weakness and weak handgrip.  Continue aspirin, Lipitor. Echocardiogram did not show any PFO, normal ejection fraction. PT/OT evaluation done and recommended CIR. Since NMO came out to be  positive, neurology strongly suspecting neuromyelitis optica so he was given a dose of Rituxan on 03/24/20.  Plan is to give another dose in 2 weeks.  Acute hypoxic respiratory failure: Aspiration pneumonia suspected.  He had deveopled  acute respite distress and was transferred to ICU.  Chest x-ray showed multifocal pneumonia .Currently respiratory status is stable.  Lungs are clear.  Currently on room air.  He completed the cefepime course  Chest pain: Initially presented with chest pain.  Troponins were negative.  Currently chest pain-free.  Nuclear stress test negative for reversible ischemia.  Insulin-dependent diabetes type 2: Monitor blood sugars.  Continue Lantus, sliding scale  Hypertension: Continue home medications.  Blood pressure acceptable  Hyperlipidemia: On Lipitor  History of malignant melanoma: Currently in remission.  Being followed at [facility]  AKI: Resolved with IV fluids  Constipation: Continue bowel  regimen  Obesity: BMI of 32.9   Discharge Diagnoses:  Active Problems:   Chest pain   Weakness   Quadriplegia (HCC)   Coronary artery disease involving native coronary artery of native heart without angina pectoris   Steroid-induced hyperglycemia   Diabetic peripheral neuropathy (HCC)   Tachypnea   Hyponatremia   AKI (acute kidney injury) ([place])   Acute hypoxemic respiratory failure (HCC)   Acute on chronic respiratory failure with hypoxia [facility])    Discharge Instructions  Discharge Instructions    Diet - low sodium heart healthy   Complete by: As directed    Discharge instructions   Complete by: As directed    1)Please take prescribed medications as instructed. 2)Follow up with [facility] neurology as an outpatient in 4 weeks   Increase activity slowly   Complete by: As directed    No wound care   Complete by: As directed      Allergies as of 03/25/2020      Reactions   Levofloxacin Rash   Penicillins Hives, Rash      Medication List    STOP taking these medications   rosuvastatin 40 MG tablet Commonly known as: CRESTOR     TAKE these medications   aspirin EC 81 MG tablet Take 81 mg by mouth at bedtime. Swallow whole.   atorvastatin 20 MG tablet Commonly known as: LIPITOR Take 1 tablet (20 mg total) by mouth daily. Start taking on: March 26, 2020   benzonatate 100 MG capsule Commonly known as: TESSALON Take 1 capsule (100 mg total) by mouth 2 (two) times daily as needed for cough.   docusate sodium 100 MG capsule Commonly known as: COLACE Take 1 capsule (100 mg total) by mouth 2 (two) times daily.   empagliflozin 25 MG Tabs tablet Commonly known as: JARDIANCE Take 12.5 mg by mouth daily.   fenofibrate 160 MG tablet Take 160 mg by mouth at bedtime.   fluticasone 50 MCG/ACT nasal spray Commonly known as: FLONASE SPRAY 2 SPRAYS INTO EACH NOSTRIL EVERY DAY What changed: See the new instructions.   ibuprofen 200 MG tablet Commonly known as:  ADVIL Take 600 mg by mouth daily as needed for headache (pain).   insulin glargine 100 unit/mL Sopn Commonly known as: LANTUS Inject 0.3 mLs (30 Units total) into the skin at bedtime. What changed: how much to take   lidocaine 5 % Commonly known as: LIDODERM Place 1 patch onto the skin daily. Remove & Discard patch within 12 hours or as directed by MD   lisinopril 20 MG tablet Commonly known as: ZESTRIL Take 1 tablet (20 mg total) by mouth daily. What changed: Another medication with the same name was removed. Continue taking this medication, and follow the directions you see here.   LORazepam 1 MG tablet Commonly known as: ATIVAN TAKE 1 TABLET BY MOUTH TWICE A DAY AS NEEDED FOR ANXIETY What changed: See the new instructions.   metFORMIN 1000 MG tablet Commonly known as: GLUCOPHAGE Take 1,000 mg by mouth 2 (two) times daily with a meal.   multivitamin with minerals Tabs tablet Take 1 tablet by mouth daily. Centrum Silver Men's   NovoLOG FlexPen 100 UNIT/ML FlexPen Generic drug: insulin aspart Inject 25-30 Units into the skin 3 (three) times daily before meals.   oxybutynin 5 MG tablet Commonly known as: DITROPAN Take 5 mg by mouth 2 (two) times daily.   pantoprazole 40 MG tablet Commonly known as: PROTONIX Take 1 tablet (40 mg total) by mouth daily. Start  taking on: March 26, 2020   polyethylene glycol 17 g packet Commonly known as: MIRALAX / GLYCOLAX Take 17 g by mouth daily. Start taking on: March 26, 2020   sertraline 100 MG tablet Commonly known as: ZOLOFT Take 150 mg by mouth at bedtime.   triamcinolone ointment 0.1 % Commonly known as: KENALOG Apply 1 application topically 2 (two) times daily.   Vascepa 1 g capsule Generic drug: icosapent Ethyl Take 2 capsules (2 g total) by mouth 2 (two) times daily.   vitamin B-12 500 MCG tablet Commonly known as: CYANOCOBALAMIN Take 500 mcg by mouth daily.   zolpidem 10 MG tablet Commonly known as: AMBIEN Take 10  mg by mouth at bedtime. For insomnia       Allergies  Allergen Reactions  . Levofloxacin Rash  . Penicillins Hives and Rash    Consultations:  Neurology   Procedures/Studies: DG Chest 1 View  Result Date: 03/16/2020 CLINICAL DATA:  Respiratory failure EXAM: CHEST  1 VIEW COMPARISON:  There yesterday FINDINGS: Patchy interstitial and airspace opacity with mild improvement in aeration. As before, this could be infection or atypical edema. No effusion or pneumothorax. Stable cardiomediastinal contours which are distorted by rightward rotation. IMPRESSION: Mildly improved aeration. Electronically Signed   By: Monte Fantasia M.D.   On: 03/16/2020 05:30   DG Lumbar Spine 2-3 Views  Result Date: 03/08/2020 CLINICAL DATA:  74 year old male with right lower extremity numbness. EXAM: LUMBAR SPINE - 2-3 VIEW COMPARISON:  Abdominal radiograph dated 11/30/2016 and CT dated 03/08/2020. FINDINGS: Five lumbar type vertebra. There is no acute fracture or subluxation of the lumbar spine. The bones are osteopenic. Multilevel degenerative changes with spurring. There is disc space narrowing at L4-L5. The visualized posterior elements are intact. There is atherosclerotic calcification of the abdominal aorta. Partially visualized high attenuating structure in the left upper abdomen corresponds to the cyst seen on the earlier CT. Excreted contrast noted within the urinary bladder. Linear density over the left lower abdomen may be artifactual and superimposed on the patient. IMPRESSION: 1. No acute fracture or subluxation of the lumbar spine. 2. Osteopenia with multilevel degenerative changes. Electronically Signed   By: Anner Crete M.D.   On: 03/08/2020 19:03   CT HEAD WO CONTRAST  Result Date: 03/10/2020 CLINICAL DATA:  Periodic paralysis.  Pre lumbar puncture evaluation. EXAM: CT HEAD WITHOUT CONTRAST TECHNIQUE: Contiguous axial images were obtained from the base of the skull through the vertex without  intravenous contrast. COMPARISON:  CT head 07/03/2015 FINDINGS: Brain: Ventricle size normal. Mild generalized atrophy. Negative for acute infarct or hemorrhage. Retro cerebellar arachnoid cyst measuring 2.7 x 5.8 cm unchanged from the prior study. Benign cysts in the basal ganglia bilaterally unchanged. Vascular: Negative for hyperdense vessel Skull: No focal skeletal lesion. Sinuses/Orbits: Mucosal edema paranasal sinuses.  Negative orbit. Other: None IMPRESSION: No acute abnormality Retro cerebellar arachnoid cyst is stable. Electronically Signed   By: Franchot Gallo M.D.   On: 03/10/2020 16:22   MR BRAIN WO CONTRAST  Result Date: 03/11/2020 CLINICAL DATA:  Stroke follow-up EXAM: MRI HEAD WITHOUT CONTRAST TECHNIQUE: Multiplanar, multiecho pulse sequences of the brain and surrounding structures were obtained without intravenous contrast. COMPARISON:  Head CT 03/10/2020 FINDINGS: BRAIN: No acute infarct, acute hemorrhage or extra-axial collection. Normal white matter signal. There is mild generalized atrophy. Unchanged mega cisterna magna. No chronic microhemorrhage. Normal midline structures. VASCULAR: Major flow voids are preserved. SKULL AND UPPER CERVICAL SPINE: Normal calvarium and skull base. Visualized upper cervical spine  and soft tissues are normal. SINUSES/ORBITS: No paranasal sinus fluid levels or advanced mucosal thickening. No mastoid or middle ear effusion. Normal orbits. IMPRESSION: 1. No acute intracranial process. 2. Mild generalized atrophy. Electronically Signed   By: Ulyses Jarred M.D.   On: 03/11/2020 22:51   MR CERVICAL SPINE WO CONTRAST  Addendum Date: 03/10/2020   ADDENDUM REPORT: 03/10/2020 19:12 ADDENDUM: Examination discussed with Dr. Macy Mis, who had previously discussed with Dr. Karena Addison Aroor. On further review, I agree there is abnormal T2-weighted signal intensity in the upper thoracic spinal cord. There is no spinal cord expansion. There are no abnormal flow voids  along the surface of the spinal cord. This could indicate a subacute to chronic spinal cord infarction. Demyelinating disease or transverse myelitis are also possibilities. The patient is undergoing a contrast-enhanced cervical and thoracic spine MRI. Electronically Signed   By: Ulyses Jarred M.D.   On: 03/10/2020 19:12   Result Date: 03/10/2020 CLINICAL DATA:  Periodic paralysis EXAM: MRI CERVICAL, THORACIC AND LUMBAR SPINE WITHOUT CONTRAST TECHNIQUE: Multiplanar and multiecho pulse sequences of the cervical spine, to include the craniocervical junction and cervicothoracic junction, and thoracic and lumbar spine, were obtained without intravenous contrast. COMPARISON:  None. FINDINGS: MRI CERVICAL SPINE FINDINGS Alignment: Physiologic. Vertebrae: No fracture, evidence of discitis, or bone lesion. Cord: Normal signal and morphology. Posterior Fossa, vertebral arteries, paraspinal tissues: Negative. Disc levels: C1-C2: Normal. C2-C3: Small central disc protrusion.  No stenosis. C3-C4: Uncovertebral hypertrophy and right asymmetric disc bulge. Moderate right foraminal stenosis. Moderate right facet hypertrophy. C4-C5: Mild right-greater-than-left facet hypertrophy. Minor disc bulge. Mild right foraminal stenosis. No spinal canal stenosis. C5-C6: Small disc bulge mild bilateral facet hypertrophy. Mild spinal canal stenosis. C6-C7: Small disc bulge with moderate spinal canal stenosis. C7-T1: Small central disc protrusion.  No spinal canal stenosis. MRI THORACIC SPINE FINDINGS Alignment:  Normal Vertebrae: No fracture, evidence of discitis, or bone lesion. Cord:  Normal Paraspinal and other soft tissues: There is a partially cystic lesion within the left renal lower pole with an internal fluid level. Disc levels: No large disc herniation or spinal canal stenosis. MRI LUMBAR SPINE FINDINGS Segmentation:  Standard Alignment:  Grade 1 anterolisthesis at L4-5 Vertebrae:  No fracture, evidence of discitis, or bone lesion.  Conus medullaris and cauda equina: Conus extends to the L1 level. Conus and cauda equina appear normal. Paraspinal and other soft tissues: Negative Disc levels: T12-L1: Normal disc space and facets. No spinal canal or neuroforaminal stenosis. L1-L2: Normal disc space and facets. No spinal canal or neuroforaminal stenosis. L2-L3: Normal disc space and facets. No spinal canal or neuroforaminal stenosis. L3-L4: Minimal disc bulge.  Mild facet hypertrophy.  No stenosis. L4-L5: Moderate right asymmetric disc bulge with moderate bilateral facet hypertrophy. There is right lateral recess stenosis with mild central spinal canal stenosis. Moderate right foraminal stenosis. L5-S1: Severe facet hypertrophy with small central disc protrusion. No spinal canal stenosis. No neural foraminal stenosis. Visualized sacrum: Normal. IMPRESSION: 1. No acute abnormality of the cervical, thoracic or lumbar spine. 2. Moderate C6-7 and mild C5-6 spinal canal stenosis. 3. Moderate right C3-4 neural foraminal stenosis. 4. Grade 1 anterolisthesis at L4-5 with mild spinal canal and right lateral recess stenosis and moderate right foraminal stenosis. Electronically Signed: By: Ulyses Jarred M.D. On: 03/09/2020 21:17   MR CERVICAL SPINE W CONTRAST  Result Date: 03/10/2020 CLINICAL DATA:  Myelopathy, acute or progressive. EXAM: MRI CERVICAL SPINE WITH CONTRAST TECHNIQUE: Multiplanar, multisequence MR imaging of the cervical spine was performed  following the administration of intravenous contrast. COMPARISON:  MRI cervical spine without contrast yesterday FINDINGS: Normal enhancement following contrast infusion. No enhancing vertebral lesion. No enhancing lesion in the cord. Paraspinous negative for soft tissue mass or abnormal enhancement. Multilevel degenerative change in the cervical spine as described on the report from yesterday. IMPRESSION: Normal enhancement of the cervical spine and cord. No mass lesion identified. Cervical spondylosis.  Electronically Signed   By: Franchot Gallo M.D.   On: 03/10/2020 18:55   MR THORACIC SPINE WO CONTRAST  Addendum Date: 03/10/2020   ADDENDUM REPORT: 03/10/2020 19:12 ADDENDUM: Examination discussed with Dr. Macy Mis, who had previously discussed with Dr. Karena Addison Aroor. On further review, I agree there is abnormal T2-weighted signal intensity in the upper thoracic spinal cord. There is no spinal cord expansion. There are no abnormal flow voids along the surface of the spinal cord. This could indicate a subacute to chronic spinal cord infarction. Demyelinating disease or transverse myelitis are also possibilities. The patient is undergoing a contrast-enhanced cervical and thoracic spine MRI. Electronically Signed   By: Ulyses Jarred M.D.   On: 03/10/2020 19:12   Result Date: 03/10/2020 CLINICAL DATA:  Periodic paralysis EXAM: MRI CERVICAL, THORACIC AND LUMBAR SPINE WITHOUT CONTRAST TECHNIQUE: Multiplanar and multiecho pulse sequences of the cervical spine, to include the craniocervical junction and cervicothoracic junction, and thoracic and lumbar spine, were obtained without intravenous contrast. COMPARISON:  None. FINDINGS: MRI CERVICAL SPINE FINDINGS Alignment: Physiologic. Vertebrae: No fracture, evidence of discitis, or bone lesion. Cord: Normal signal and morphology. Posterior Fossa, vertebral arteries, paraspinal tissues: Negative. Disc levels: C1-C2: Normal. C2-C3: Small central disc protrusion.  No stenosis. C3-C4: Uncovertebral hypertrophy and right asymmetric disc bulge. Moderate right foraminal stenosis. Moderate right facet hypertrophy. C4-C5: Mild right-greater-than-left facet hypertrophy. Minor disc bulge. Mild right foraminal stenosis. No spinal canal stenosis. C5-C6: Small disc bulge mild bilateral facet hypertrophy. Mild spinal canal stenosis. C6-C7: Small disc bulge with moderate spinal canal stenosis. C7-T1: Small central disc protrusion.  No spinal canal stenosis. MRI THORACIC SPINE  FINDINGS Alignment:  Normal Vertebrae: No fracture, evidence of discitis, or bone lesion. Cord:  Normal Paraspinal and other soft tissues: There is a partially cystic lesion within the left renal lower pole with an internal fluid level. Disc levels: No large disc herniation or spinal canal stenosis. MRI LUMBAR SPINE FINDINGS Segmentation:  Standard Alignment:  Grade 1 anterolisthesis at L4-5 Vertebrae:  No fracture, evidence of discitis, or bone lesion. Conus medullaris and cauda equina: Conus extends to the L1 level. Conus and cauda equina appear normal. Paraspinal and other soft tissues: Negative Disc levels: T12-L1: Normal disc space and facets. No spinal canal or neuroforaminal stenosis. L1-L2: Normal disc space and facets. No spinal canal or neuroforaminal stenosis. L2-L3: Normal disc space and facets. No spinal canal or neuroforaminal stenosis. L3-L4: Minimal disc bulge.  Mild facet hypertrophy.  No stenosis. L4-L5: Moderate right asymmetric disc bulge with moderate bilateral facet hypertrophy. There is right lateral recess stenosis with mild central spinal canal stenosis. Moderate right foraminal stenosis. L5-S1: Severe facet hypertrophy with small central disc protrusion. No spinal canal stenosis. No neural foraminal stenosis. Visualized sacrum: Normal. IMPRESSION: 1. No acute abnormality of the cervical, thoracic or lumbar spine. 2. Moderate C6-7 and mild C5-6 spinal canal stenosis. 3. Moderate right C3-4 neural foraminal stenosis. 4. Grade 1 anterolisthesis at L4-5 with mild spinal canal and right lateral recess stenosis and moderate right foraminal stenosis. Electronically Signed: By: Ulyses Jarred M.D. On: 03/09/2020 21:17  MR THORACIC SPINE W CONTRAST  Result Date: 03/10/2020 CLINICAL DATA:  Myelopathy, acute or progressive. EXAM: MRI THORACIC SPINE WITH CONTRAST TECHNIQUE: Multiplanar, multisequence MR imaging of the thoracic spine was performed following the administration of intravenous  contrast. COMPARISON:  MRI thoracic spine without contrast yesterday CONTRAST:  9.8 mL Gadovist IV FINDINGS: 6 x 10 mm enhancing lesion in the T9 vertebral body on the left located posteriorly. This is low signal on T1 and high signal on T2 on yesterday's study and shows fairly homogeneous enhancement. This lesion is not visible on recent chest CT of 03/08/2020. No fracture or extraosseous extension. No other bone marrow lesions identified. Normal enhancement of the spinal cord.  No cord mass identified. Paraspinous soft tissues enhance normally. IMPRESSION: 6 x 10 mm enhancing lesion left T9 vertebral body. This appearance is nonspecific and could be seen with benign lesion such as atypical hemangioma versus neoplasm such as metastatic disease. The patient has history of melanoma. There is no cord compression or cause for myelopathy identified. Electronically Signed   By: Franchot Gallo M.D.   On: 03/10/2020 19:06   MR LUMBAR SPINE WO CONTRAST  Addendum Date: 03/10/2020   ADDENDUM REPORT: 03/10/2020 19:12 ADDENDUM: Examination discussed with Dr. Macy Mis, who had previously discussed with Dr. Karena Addison Aroor. On further review, I agree there is abnormal T2-weighted signal intensity in the upper thoracic spinal cord. There is no spinal cord expansion. There are no abnormal flow voids along the surface of the spinal cord. This could indicate a subacute to chronic spinal cord infarction. Demyelinating disease or transverse myelitis are also possibilities. The patient is undergoing a contrast-enhanced cervical and thoracic spine MRI. Electronically Signed   By: Ulyses Jarred M.D.   On: 03/10/2020 19:12   Result Date: 03/10/2020 CLINICAL DATA:  Periodic paralysis EXAM: MRI CERVICAL, THORACIC AND LUMBAR SPINE WITHOUT CONTRAST TECHNIQUE: Multiplanar and multiecho pulse sequences of the cervical spine, to include the craniocervical junction and cervicothoracic junction, and thoracic and lumbar spine, were  obtained without intravenous contrast. COMPARISON:  None. FINDINGS: MRI CERVICAL SPINE FINDINGS Alignment: Physiologic. Vertebrae: No fracture, evidence of discitis, or bone lesion. Cord: Normal signal and morphology. Posterior Fossa, vertebral arteries, paraspinal tissues: Negative. Disc levels: C1-C2: Normal. C2-C3: Small central disc protrusion.  No stenosis. C3-C4: Uncovertebral hypertrophy and right asymmetric disc bulge. Moderate right foraminal stenosis. Moderate right facet hypertrophy. C4-C5: Mild right-greater-than-left facet hypertrophy. Minor disc bulge. Mild right foraminal stenosis. No spinal canal stenosis. C5-C6: Small disc bulge mild bilateral facet hypertrophy. Mild spinal canal stenosis. C6-C7: Small disc bulge with moderate spinal canal stenosis. C7-T1: Small central disc protrusion.  No spinal canal stenosis. MRI THORACIC SPINE FINDINGS Alignment:  Normal Vertebrae: No fracture, evidence of discitis, or bone lesion. Cord:  Normal Paraspinal and other soft tissues: There is a partially cystic lesion within the left renal lower pole with an internal fluid level. Disc levels: No large disc herniation or spinal canal stenosis. MRI LUMBAR SPINE FINDINGS Segmentation:  Standard Alignment:  Grade 1 anterolisthesis at L4-5 Vertebrae:  No fracture, evidence of discitis, or bone lesion. Conus medullaris and cauda equina: Conus extends to the L1 level. Conus and cauda equina appear normal. Paraspinal and other soft tissues: Negative Disc levels: T12-L1: Normal disc space and facets. No spinal canal or neuroforaminal stenosis. L1-L2: Normal disc space and facets. No spinal canal or neuroforaminal stenosis. L2-L3: Normal disc space and facets. No spinal canal or neuroforaminal stenosis. L3-L4: Minimal disc bulge.  Mild facet hypertrophy.  No stenosis. L4-L5: Moderate right asymmetric disc bulge with moderate bilateral facet hypertrophy. There is right lateral recess stenosis with mild central spinal canal  stenosis. Moderate right foraminal stenosis. L5-S1: Severe facet hypertrophy with small central disc protrusion. No spinal canal stenosis. No neural foraminal stenosis. Visualized sacrum: Normal. IMPRESSION: 1. No acute abnormality of the cervical, thoracic or lumbar spine. 2. Moderate C6-7 and mild C5-6 spinal canal stenosis. 3. Moderate right C3-4 neural foraminal stenosis. 4. Grade 1 anterolisthesis at L4-5 with mild spinal canal and right lateral recess stenosis and moderate right foraminal stenosis. Electronically Signed: By: Ulyses Jarred M.D. On: 03/09/2020 21:17   MR CERVICAL SPINE W WO CONTRAST  Result Date: 03/16/2020 CLINICAL DATA:  Demyelinating disease EXAM: MRI CERVICAL AND THORACIC SPINE WITHOUT AND WITH CONTRAST TECHNIQUE: Multisequence MR imaging of the CERVICAL AND THORACIC SPINE was performed prior to and following IV contrast administration. CONTRAST:  71mL GADAVIST GADOBUTROL 1 MMOL/ML IV SOLN COMPARISON:  03/09/2020 FINDINGS: MRI CERVICAL SPINE FINDINGS Alignment: Physiologic Vertebrae: No fracture, evidence of discitis, or bone lesion. No evidence of altered marrow enhancement. Cord: Progressive T2 hyperintensity within the central cord extending from C4-5 to T4-5, with mild cord expansion and subtle patchy enhancement at the cervical levels. No cord compression or canal collection. No abnormal vessels over the cord. Posterior Fossa, vertebral arteries, paraspinal tissues: No contributory finding Disc levels: Ordinary degenerative changes.  No cord compression. MRI THORACIC SPINE FINDINGS Alignment:  Mild exaggerated thoracic kyphosis. Vertebrae: No fracture, evidence of discitis, or bone lesion. No abnormal vertebral enhancement. Cord:  T2 hyperintensity in the central cord as noted above. Paraspinal and other soft tissues: Small pleural effusions. Mild dependent atelectasis. Disc levels: Spondylosis which is likely bridging at multiple levels. IMPRESSION: Progressive cord edema extending  from C4-5 to T4-5 with mild patchy enhancement. Transverse myelitis and cord infarct are the primary considerations, with progression favoring myelitis. Electronically Signed   By: Monte Fantasia M.D.   On: 03/16/2020 10:45   MR THORACIC SPINE W WO CONTRAST  Result Date: 03/16/2020 CLINICAL DATA:  Demyelinating disease EXAM: MRI CERVICAL AND THORACIC SPINE WITHOUT AND WITH CONTRAST TECHNIQUE: Multisequence MR imaging of the CERVICAL AND THORACIC SPINE was performed prior to and following IV contrast administration. CONTRAST:  19mL GADAVIST GADOBUTROL 1 MMOL/ML IV SOLN COMPARISON:  03/09/2020 FINDINGS: MRI CERVICAL SPINE FINDINGS Alignment: Physiologic Vertebrae: No fracture, evidence of discitis, or bone lesion. No evidence of altered marrow enhancement. Cord: Progressive T2 hyperintensity within the central cord extending from C4-5 to T4-5, with mild cord expansion and subtle patchy enhancement at the cervical levels. No cord compression or canal collection. No abnormal vessels over the cord. Posterior Fossa, vertebral arteries, paraspinal tissues: No contributory finding Disc levels: Ordinary degenerative changes.  No cord compression. MRI THORACIC SPINE FINDINGS Alignment:  Mild exaggerated thoracic kyphosis. Vertebrae: No fracture, evidence of discitis, or bone lesion. No abnormal vertebral enhancement. Cord:  T2 hyperintensity in the central cord as noted above. Paraspinal and other soft tissues: Small pleural effusions. Mild dependent atelectasis. Disc levels: Spondylosis which is likely bridging at multiple levels. IMPRESSION: Progressive cord edema extending from C4-5 to T4-5 with mild patchy enhancement. Transverse myelitis and cord infarct are the primary considerations, with progression favoring myelitis. Electronically Signed   By: Monte Fantasia M.D.   On: 03/16/2020 10:45   NM Myocar Multi W/Spect W/Wall Motion / EF  Result Date: 03/09/2020 CLINICAL DATA:  Recent onset of chest pain. History  diabetes, remote myocardial infarction and coronary stenting.  EXAM: MYOCARDIAL IMAGING WITH SPECT (REST AND PHARMACOLOGIC-STRESS) GATED LEFT VENTRICULAR WALL MOTION STUDY LEFT VENTRICULAR EJECTION FRACTION TECHNIQUE: Standard myocardial SPECT imaging was performed after resting intravenous injection of 11 mCi Tc-28m tetrofosmin. Subsequently, intravenous infusion of Lexiscan was performed under the supervision of the Cardiology staff. At peak effect of the drug, 29 mCi Tc-69m tetrofosmin was injected intravenously and standard myocardial SPECT imaging was performed. Quantitative gated imaging was also performed to evaluate left ventricular wall motion, and estimate left ventricular ejection fraction. COMPARISON:  Images from nuclear medicine myocardial perfusion study 09/25/2013 without report FINDINGS: Perfusion: No decreased activity in the left ventricle on stress imaging to suggest reversible ischemia or infarction. Mildly prominent subdiaphragmatic activity on the rest images. Wall Motion: Normal left ventricular wall motion. No left ventricular dilation. Left Ventricular Ejection Fraction: 53 % End diastolic volume 91 ml End systolic volume 43 ml IMPRESSION: 1. No reversible ischemia or infarction. 2. Normal left ventricular wall motion. 3. Left ventricular ejection fraction 53% 4. Non invasive risk stratification*: Low *2012 Appropriate Use Criteria for Coronary Revascularization Focused Update: J Am Coll Cardiol. 0272;53(6):644-034. http://content.airportbarriers.com.aspx?articleid=[email or web address] Electronically Signed   By: Richardean Sale M.D.   On: 03/09/2020 16:43   DG Chest Port 1 View  Result Date: 03/18/2020 CLINICAL DATA:  Cough EXAM: PORTABLE CHEST 1 VIEW COMPARISON:  March 17, 2020 FINDINGS: There is persistent airspace opacity in the right upper lobe. There is more ill-defined airspace opacity in both mid and lower lung regions, similar to 1 day prior. No new opacity evident. Heart is upper normal  in size, stable, with pulmonary vascularity within normal limits. No adenopathy. There is aortic atherosclerosis. There is degenerative change in each shoulder. IMPRESSION: Multifocal airspace opacity bilaterally, most severe in the right upper lobe. Suspect multifocal pneumonia, although a degree of superimposed pulmonary edema may be present. No new opacity evident. Stable cardiac silhouette. Aortic Atherosclerosis (ICD10-I70.0). Electronically Signed   By: Lowella Grip III M.D.   On: 03/18/2020 08:01   DG Chest Port 1 View  Result Date: 03/17/2020 CLINICAL DATA:  Acute on chronic respiratory failure with hypoxia. EXAM: PORTABLE CHEST 1 VIEW COMPARISON:  Chest x-ray 03/16/2020 FINDINGS: Worsening lung aeration with progressive interstitial and airspace process more pronounced in the right lung. No definite pleural effusions. Stable tortuous and calcified thoracic aorta. No cardiac enlargement. IMPRESSION: Worsening lung aeration with progressive interstitial and airspace process more pronounced in the right lung. Electronically Signed   By: Marijo Sanes M.D.   On: 03/17/2020 07:51   DG CHEST PORT 1 VIEW  Result Date: 03/15/2020 CLINICAL DATA:  Shortness of breath EXAM: PORTABLE CHEST 1 VIEW COMPARISON:  03/08/2020 FINDINGS: Airspace disease throughout the lungs bilaterally, right greater than left. Mild cardiomegaly. No visible effusions. No acute bony abnormality. IMPRESSION: Patchy bilateral airspace disease, right greater than left with cardiomegaly. Findings could reflect asymmetric edema or infection. Electronically Signed   By: Rolm Baptise M.D.   On: 03/15/2020 18:43   DG Chest Portable 1 View  Result Date: 03/08/2020 CLINICAL DATA:  Central chest pain radiating to back which shortness-of-breath and diaphoresis. EXAM: PORTABLE CHEST 1 VIEW COMPARISON:  11/11/2018 FINDINGS: Lungs are adequately inflated without focal airspace consolidation or effusion. Borderline stable cardiomegaly.  Degenerative change of the spine. IMPRESSION: No acute cardiopulmonary disease. Electronically Signed   By: Marin Olp M.D.   On: 03/08/2020 12:31   DG Abd Portable 1V  Result Date: 03/15/2020 CLINICAL DATA:  Shortness of breath EXAM: PORTABLE ABDOMEN - 1 VIEW  COMPARISON:  CT 03/08/2020 FINDINGS: Nonobstructive bowel gas pattern. Prior cholecystectomy. No organomegaly or free air. Hyperdense area seen in the left upper quadrant corresponding to the layering high-density material/calcifications within the left renal cyst. Diffuse airspace disease throughout the lungs. IMPRESSION: No evidence of bowel obstruction or free air. Layering calcifications within the left renal cyst as seen on prior CT. Diffuse airspace disease throughout the lungs. Electronically Signed   By: Rolm Baptise M.D.   On: 03/15/2020 18:46   DG Swallowing Func-Speech Pathology  Result Date: 03/18/2020 Objective Swallowing Evaluation: Type of Study: MBS-Modified Barium Swallow Study  Patient Details Name: Yussuf Sawyers. MRN: [id number] Date of Birth: 07-19-1946 Today's Date: 03/18/2020 Time: SLP Start Time (ACUTE ONLY): 1310 -SLP Stop Time (ACUTE ONLY): 1325 SLP Time Calculation (min) (ACUTE ONLY): 15 min Past Medical History: Past Medical History: Diagnosis Date . Allergy  . CAD (coronary artery disease)   a. angioplasty of his RCA in 1990. b. bare metal stent placed in the RCA in 2000 followed by rotational atherectomy shortly after for stent restenosis. c. last cath was in 2012 showing stable moderate diffuse CAD. (70% mid LAD, 80% diagonal, 70% Ramus, 40% mid to distal RCA stent restenosis). d. Low risk nuc in 2015. . Diabetes mellitus  . Diverticulosis  . Elevated CK  . Erectile dysfunction  . Hemorrhoids  . HTN (hypertension)  . Hyperlipidemia  . Hypertriglyceridemia  . Malignant melanoma of left side of neck ([place]) 10/25/2018 . Myocardial infarction ([place])  . Obesity  . OSA (obstructive sleep apnea)  . Persistent disorder of  initiating or maintaining sleep  . Personal history of colonic polyps 02/05/2003 Past Surgical History: Past Surgical History: Procedure Laterality Date . CHOLECYSTECTOMY   . CORONARY ANGIOPLASTY   . CORONARY STENT PLACEMENT    stenting of the right coronary artery with followup rotational  atherectomy. (3 stents placed) . FINGER SURGERY    right . FOOT SURGERY    right . INGUINAL HERNIA REPAIR    right . melanoma removal    neck . ORTHOPEDIC SURGERY    foot right . rotator cuff surg    Bil HPI: 74 year old man with PMH listed below including melanoma presenting with chest pain with neg stress test.  He then developed scattered neurological deficits prompting extensive workup to date.   Pt was evalauted by SLP on 6/23, found ot have weak cough mechanism, but no signs of aspiration, tolerating diet. Thought to be stable in terms of neuro exam until 6/27 when he became more SOB, reported increased LE weakness. Critical care questioned secretion clearance issues and volume overload as well as constipation reducing FRC.   Chest x-ray shows possibility of infiltrates or atelectasis.  MRI performed (6/28) revealed progressive cord edema extending from C4-5 to T4-5 with mild patchy enhancement. Transverse myelitis and cord infarct are the primary considerations, with progression favoring myelitis, per Radiology. However, the abrupt onset on 6/20 is more suggestive of a spinal cord infarction. Initiated empiric IVIG. On a liquid diet.  No data recorded Assessment / Plan / Recommendation CHL IP CLINICAL IMPRESSIONS 03/18/2020 Clinical Impression Pt demosntrates two instances of trace premature spillage prior to swallow initiation that resulted in laryngeal penetration with eventual ejection via laryngeal squeeze. Otherwise pharyngeal function WNL. Pt has dry oral mucosa, suspect this may reduce oral control and sensation. Problem is mild and not significantly detrimental to pts safety. Recommend pt continue current diet and  focus on oral hygiene and hydration. No SLP  f/u needed, will sign off.  SLP Visit Diagnosis Dysphagia, oral phase (R13.11) Attention and concentration deficit following -- Frontal lobe and executive function deficit following -- Impact on safety and function Mild aspiration risk   CHL IP TREATMENT RECOMMENDATION 03/18/2020 Treatment Recommendations No treatment recommended at this time   Prognosis 03/17/2020 Prognosis for Safe Diet Advancement Good Barriers to Reach Goals -- Barriers/Prognosis Comment -- CHL IP DIET RECOMMENDATION 03/18/2020 SLP Diet Recommendations Regular solids;Thin liquid Liquid Administration via Cup;Straw Medication Administration Whole meds with liquid Compensations -- Postural Changes Seated upright at 90 degrees   CHL IP OTHER RECOMMENDATIONS 03/18/2020 Recommended Consults -- Oral Care Recommendations Oral care QID Other Recommendations --   CHL IP FOLLOW UP RECOMMENDATIONS 03/18/2020 Follow up Recommendations None   CHL IP FREQUENCY AND DURATION 03/17/2020 Speech Therapy Frequency (ACUTE ONLY) min 2x/week Treatment Duration 2 weeks      CHL IP ORAL PHASE 03/18/2020 Oral Phase Impaired Oral - Pudding Teaspoon -- Oral - Pudding Cup -- Oral - Honey Teaspoon -- Oral - Honey Cup -- Oral - Nectar Teaspoon -- Oral - Nectar Cup -- Oral - Nectar Straw -- Oral - Thin Teaspoon -- Oral - Thin Cup NT Oral - Thin Straw Premature spillage Oral - Puree WFL Oral - Mech Soft -- Oral - Regular WFL Oral - Multi-Consistency -- Oral - Pill -- Oral Phase - Comment --  No flowsheet data found. No flowsheet data found. Herbie [place], MA CCC-SLP Acute Rehabilitation Services Pager [phone] Office [phone] Lynann Beaver 03/18/2020, 1:37 PM              VAS [place] ABI WITH/WO TBI  Result Date: 03/09/2020 LOWER EXTREMITY DOPPLER STUDY Indications: Cold right foot, unable to move right leg, hip pain. High Risk Factors: Hypertension, hyperlipidemia, Diabetes, past history of                    smoking,  coronary artery disease. Other Factors: History of lumbar issues.  Comparison Study: No prior study on file Performing Technologist: Sharion Dove RVS  Examination Guidelines: A complete evaluation includes at minimum, Doppler waveform signals and systolic blood pressure reading at the level of bilateral brachial, anterior tibial, and posterior tibial arteries, when vessel segments are accessible. Bilateral testing is considered an integral part of a complete examination. Photoelectric Plethysmograph (PPG) waveforms and toe systolic pressure readings are included as required and additional duplex testing as needed. Limited examinations for reoccurring indications may be performed as noted.  ABI Findings: +---------+------------------+-----+-----------+--------+ Right    Rt Pressure (mmHg)IndexWaveform   Comment  +---------+------------------+-----+-----------+--------+ Brachial 161                    triphasic           +---------+------------------+-----+-----------+--------+ Popliteal                       multiphasic         +---------+------------------+-----+-----------+--------+ PTA      197               1.22 multiphasic         +---------+------------------+-----+-----------+--------+ DP       201               1.25 multiphasic         +---------+------------------+-----+-----------+--------+ Great Toe122               0.76                     +---------+------------------+-----+-----------+--------+ +---------+------------------+-----+-----------+-------+  Left     Lt Pressure (mmHg)IndexWaveform   Comment +---------+------------------+-----+-----------+-------+ Brachial 144                    triphasic          +---------+------------------+-----+-----------+-------+ PTA      196               1.22 multiphasic        +---------+------------------+-----+-----------+-------+ DP       191               1.19 multiphasic         +---------+------------------+-----+-----------+-------+ Great Toe115               0.71                    +---------+------------------+-----+-----------+-------+ +-------+-----------+-----------+------------+------------+ ABI/TBIToday's ABIToday's TBIPrevious ABIPrevious TBI +-------+-----------+-----------+------------+------------+ Right  1.2        0.76                                +-------+-----------+-----------+------------+------------+ Left   1.2        0.71                                +-------+-----------+-----------+------------+------------+  Summary: Right: Resting right ankle-brachial index is within normal range. No evidence of significant right lower extremity arterial disease. The right toe-brachial index is normal. Left: Resting left ankle-brachial index is within normal range. No evidence of significant left lower extremity arterial disease. The left toe-brachial index is normal.  *See table(s) above for measurements and observations.  Electronically signed by Ruta Hinds MD on 03/09/2020 at 3:38:09 PM.    Final    ECHOCARDIOGRAM COMPLETE BUBBLE STUDY  Result Date: 03/12/2020    ECHOCARDIOGRAM REPORT   Patient Name:   Rishawn Walck. Date of Exam: 03/12/2020 Medical Rec #:  [id number]           Height:       72.0 in Accession #:    [id number]          Weight:       226.4 lb Date of Birth:  Dec 09, 1945          BSA:          2.246 m Patient Age:    73 years            BP:           117/57 mmHg Patient Gender: M                   HR:           58 bpm. Exam Location:  Inpatient Procedure: 2D Echo and Saline Contrast Bubble Study Indications:    stroke 434.91  History:        Patient has prior history of Echocardiogram examinations, most                 recent 08/18/2017. CAD, Signs/Symptoms:Chest Pain; Risk                 Factors:Hypertension, Dyslipidemia, Diabetes and quadriplegia.  Sonographer:    Johny Chess Referring Phys: [id number] [place] R AROOR  IMPRESSIONS  1. Left ventricular ejection fraction, by estimation, is 60 to 65%. The left ventricle has normal function. The left ventricle has no regional wall  motion abnormalities. There is mild left ventricular hypertrophy. Left ventricular diastolic parameters were normal.  2. Right ventricular systolic function is normal. The right ventricular size is normal. There is normal pulmonary artery systolic pressure. The estimated right ventricular systolic pressure is 51.7 mmHg.  3. The mitral valve is normal in structure. No evidence of mitral valve regurgitation.  4. The aortic valve is tricuspid. Aortic valve regurgitation is not visualized. No aortic stenosis is present.  5. The inferior vena cava is dilated in size with <50% respiratory variability, suggesting right atrial pressure of 15 mmHg.  6. Agitated saline contrast bubble study was negative, with no evidence of any interatrial shunt, though technically difficult study FINDINGS  Left Ventricle: Left ventricular ejection fraction, by estimation, is 60 to 65%. The left ventricle has normal function. The left ventricle has no regional wall motion abnormalities. The left ventricular internal cavity size was normal in size. There is  mild left ventricular hypertrophy. Left ventricular diastolic parameters were normal. Right Ventricle: The right ventricular size is normal. Right vetricular wall thickness was not assessed. Right ventricular systolic function is normal. There is normal pulmonary artery systolic pressure. The tricuspid regurgitant velocity is 2.06 m/s, and with an assumed right atrial pressure of 15 mmHg, the estimated right ventricular systolic pressure is 61.6 mmHg. Left Atrium: Left atrial size was normal in size. Right Atrium: Right atrial size was normal in size. Pericardium: There is no evidence of pericardial effusion. Mitral Valve: The mitral valve is normal in structure. No evidence of mitral valve regurgitation. Tricuspid Valve: The  tricuspid valve is normal in structure. Tricuspid valve regurgitation is trivial. Aortic Valve: The aortic valve is tricuspid. Aortic valve regurgitation is not visualized. No aortic stenosis is present. Pulmonic Valve: The pulmonic valve was not well visualized. Pulmonic valve regurgitation is not visualized. Aorta: The aortic root and ascending aorta are structurally normal, with no evidence of dilitation. Venous: The inferior vena cava is dilated in size with less than 50% respiratory variability, suggesting right atrial pressure of 15 mmHg. IAS/Shunts: No atrial level shunt detected by color flow Doppler. Agitated saline contrast was given intravenously to evaluate for intracardiac shunting. Agitated saline contrast bubble study was negative, with no evidence of any interatrial shunt.  LEFT VENTRICLE PLAX 2D LVIDd:         5.10 cm  Diastology LVIDs:         3.30 cm  LV e' lateral:   10.80 cm/s LV PW:         0.90 cm  LV E/e' lateral: 7.1 LV IVS:        1.10 cm  LV e' medial:    9.36 cm/s LVOT diam:     2.00 cm  LV E/e' medial:  8.2 LV SV:         76 LV SV Index:   34 LVOT Area:     3.14 cm  RIGHT VENTRICLE             IVC RV S prime:     10.80 cm/s  IVC diam: 2.50 cm TAPSE (M-mode): 2.6 cm LEFT ATRIUM             Index       RIGHT ATRIUM           Index LA diam:        3.70 cm 1.65 cm/m  RA Area:     19.80 cm LA Vol (A2C):   58.1 ml 25.87 ml/m RA Volume:  58.50 ml  26.05 ml/m LA Vol (A4C):   61.2 ml 27.25 ml/m LA Biplane Vol: 62.4 ml 27.78 ml/m  AORTIC VALVE LVOT Vmax:   92.00 cm/s LVOT Vmean:  65.600 cm/s LVOT VTI:    0.241 m  AORTA Ao Root diam: 3.20 cm Ao Asc diam:  3.40 cm MITRAL VALVE               TRICUSPID VALVE MV Area (PHT): 5.38 cm    TR Peak grad:   17.0 mmHg MV Decel Time: 141 msec    TR Vmax:        206.00 cm/s MV E velocity: 77.10 cm/s MV A velocity: 58.30 cm/s  SHUNTS MV E/A ratio:  1.32        Systemic VTI:  0.24 m                            Systemic Diam: 2.00 cm Oswaldo Milian MD  Electronically signed by Oswaldo Milian MD Signature Date/Time: 03/12/2020/5:44:29 PM    Final    CT Angio Chest/Abd/Pel for Dissection W and/or W/WO  Result Date: 03/08/2020 CLINICAL DATA:  Chest pain radiating to the back. Shortness of breath EXAM: CT ANGIOGRAPHY CHEST, ABDOMEN AND PELVIS TECHNIQUE: Non-contrast CT of the chest was initially obtained. Multidetector CT imaging through the chest, abdomen and pelvis was performed using the standard protocol during bolus administration of intravenous contrast. Multiplanar reconstructed images and MIPs were obtained and reviewed to evaluate the vascular anatomy. CONTRAST:  160mL OMNIPAQUE IOHEXOL 350 MG/ML SOLN COMPARISON:  CT 07/03/2015, 07/16/2012. FINDINGS: CTA CHEST FINDINGS Cardiovascular: Preferential opacification of the thoracic aorta. No evidence of thoracic aortic aneurysm or dissection. Precontrast images of the chest reveal no evidence of intramural hematoma. Three vessel arch. Atherosclerotic calcifications of the aorta and coronary arteries. Normal heart size. No pericardial effusion. Mediastinum/Nodes: No enlarged mediastinal, hilar, or axillary lymph nodes. Thyroid gland, trachea, and esophagus demonstrate no significant findings. Lungs/Pleura: Mild bibasilar subsegmental atelectasis. Mosaic attenuation within the mid to lower lung fields bilaterally. No focal airspace consolidation. No pleural effusion or pneumothorax. Musculoskeletal: No chest wall abnormality. No acute or significant osseous findings. Review of the MIP images confirms the above findings. CTA ABDOMEN AND PELVIS FINDINGS VASCULAR Aorta: Normal caliber aorta without aneurysm, dissection, vasculitis or significant stenosis. Moderate calcified atherosclerotic plaque. Celiac: Patent without evidence of aneurysm, dissection, vasculitis or significant stenosis. SMA: Patent without evidence of aneurysm, dissection, vasculitis or significant stenosis. Renals: Both renal arteries are  patent without evidence of aneurysm, dissection, vasculitis, fibromuscular dysplasia or significant stenosis. IMA: Patent. Inflow: Patent without evidence of aneurysm, dissection, vasculitis or significant stenosis. Moderate calcified atherosclerotic plaque. Veins: No obvious venous abnormality within the limitations of this arterial phase study. Review of the MIP images confirms the above findings. NON-VASCULAR Hepatobiliary: No focal liver abnormality is seen. Status post cholecystectomy. No biliary dilatation. Pancreas: Unremarkable. No pancreatic ductal dilatation or surrounding inflammatory changes. Spleen: Normal in size without focal abnormality. Adrenals/Urinary Tract: Unremarkable adrenal glands. Left upper pole renal cyst with layering high attenuation material has slightly increased in size from 2016, now measuring 3.9 cm and previously measuring 3.4 cm. Additional mid pole 2.9 cm left simple renal cyst has also slightly increased in size (previously measured 2.2 cm. The right kidney is unremarkable. No suspicious renal mass, stone, or hydronephrosis. Ureters and urinary bladder within normal limits. Stomach/Bowel: Stomach is within normal limits. Appendix appears normal (series 7, image 257). Scattered colonic  diverticulosis. No evidence of bowel wall thickening, distention, or inflammatory changes. Lymphatic: No abdominopelvic lymphadenopathy. Reproductive: Prostate gland within normal limits. Other: No abdominal wall hernia or abnormality. No abdominopelvic ascites. Soft tissue density within the lower anterior abdominal wall, slightly increased from prior, and may reflect injection related changes. Musculoskeletal: No acute or significant osseous findings. Review of the MIP images confirms the above findings. IMPRESSION: 1. Negative for aortic aneurysm or dissection. 2. Mosaic attenuation within the mid to lower lung fields bilaterally, which can be seen in the setting of small airways disease. 3. No  acute findings within the chest, abdomen, or pelvis. 4. Colonic diverticulosis without evidence of acute diverticulitis. 5. Aortic and coronary artery atherosclerosis. Electronically Signed   By: Davina Poke D.O.   On: 03/08/2020 13:56   VAS US CAROTID  Result Date: 03/12/2020 Carotid Arterial Duplex Study Indications:       Numbness and Weakness. Risk Factors:      Hypertension, hyperlipidemia, Diabetes, prior MI, coronary                    artery disease. Comparison Study:  No prior studies. Performing Technologist: Darlin Coco  Examination Guidelines: A complete evaluation includes B-mode imaging, spectral Doppler, color Doppler, and power Doppler as needed of all accessible portions of each vessel. Bilateral testing is considered an integral part of a complete examination. Limited examinations for reoccurring indications may be performed as noted.  Right Carotid Findings: +----------+--------+--------+--------+---------------------+------------------+           PSV cm/sEDV cm/sStenosisPlaque Description   Comments           +----------+--------+--------+--------+---------------------+------------------+ CCA Prox  70      11              heterogenous and     intimal thickening                                   focal                                   +----------+--------+--------+--------+---------------------+------------------+ CCA Distal69      11                                   intimal thickening +----------+--------+--------+--------+---------------------+------------------+ ICA Prox  78      9       1-39%   calcific                                +----------+--------+--------+--------+---------------------+------------------+ ICA Distal63      11                                                      +----------+--------+--------+--------+---------------------+------------------+ ECA       110                                                              +----------+--------+--------+--------+---------------------+------------------+ +----------+--------+-------+----------------+-------------------+  PSV cm/sEDV cmsDescribe        Arm Pressure (mmHG) +----------+--------+-------+----------------+-------------------+ [id number]            Multiphasic, WNL                    +----------+--------+-------+----------------+-------------------+ +---------+--------+--+--------+--+---------+ VertebralPSV cm/s42EDV cm/s11Antegrade +---------+--------+--+--------+--+---------+  Left Carotid Findings: +----------+--------+--------+--------+------------------+------------------+           PSV cm/sEDV cm/sStenosisPlaque DescriptionComments           +----------+--------+--------+--------+------------------+------------------+ CCA Prox  70      12              focal and smooth  intimal thickening +----------+--------+--------+--------+------------------+------------------+ CCA Distal67      10                                intimal thickening +----------+--------+--------+--------+------------------+------------------+ ICA Prox  56      10      1-39%   calcific                             +----------+--------+--------+--------+------------------+------------------+ ICA Distal56      11                                                   +----------+--------+--------+--------+------------------+------------------+ +----------+--------+--------+----------------+-------------------+           PSV cm/sEDV cm/sDescribe        Arm Pressure (mmHG) +----------+--------+--------+----------------+-------------------+ [id number]              Multiphasic, WNL                    +----------+--------+--------+----------------+-------------------+ +---------+--------+--+--------+--+---------+ VertebralPSV cm/s59EDV cm/s12Antegrade +---------+--------+--+--------+--+---------+   Summary: Right Carotid:  Velocities in the right ICA are consistent with a 1-39% stenosis.                Non-hemodynamically significant plaque <50% noted in the CCA. The                extracranial vessels were near-normal with only minimal wall                thickening or plaque. Left Carotid: Velocities in the left ICA are consistent with a 1-39% stenosis.               Non-hemodynamically significant plaque <50% noted in the CCA. The               extracranial vessels were near-normal with only minimal wall               thickening or plaque. Vertebrals:  Bilateral vertebral arteries demonstrate antegrade flow. Subclavians: Normal flow hemodynamics were seen in bilateral subclavian              arteries. *See table(s) above for measurements and observations.  Electronically signed by Servando Snare MD on 03/12/2020 at 9:01:50 PM.    Final    DG FL GUIDED LUMBAR PUNCTURE  Result Date: 03/10/2020 CLINICAL DATA:  Paraplegia. EXAM: DIAGNOSTIC LUMBAR PUNCTURE UNDER FLUOROSCOPIC GUIDANCE FLUOROSCOPY TIME:  Fluoroscopy Time:  2 minutes, 36 seconds Radiation Exposure Index (if provided by the fluoroscopic device): 73.0 mGy Number of Acquired Spot Images: 5 PROCEDURE: Informed consent was obtained from the patient prior to the  procedure, including potential complications of headache, allergy, and pain. With the patient prone, the lower back was prepped with Betadine. 1% Lidocaine was used for local anesthesia. Lumbar puncture was performed at the L2-L3 level using a 20 gauge needle with return of clear CSF. 12 ml of CSF were obtained for laboratory studies. The patient tolerated the procedure well and there were no immediate postprocedure complications. IMPRESSION: Technically successful fluoroscopically guided L2-L3 lumbar puncture. No immediate postprocedure complication. 12 mL of CSF were obtained for laboratory studies. Electronically Signed   By: Kellie Simmering DO   On: 03/10/2020 17:18       Subjective: Patient seen and examined  the bedside this morning.  Hemodynamically stable for discharge to CIR today.  Discharge Exam: Vitals:   03/25/20 0425 03/25/20 0809  BP: (!) 120/50 (!) 143/56  Pulse: (!) 52 (!) 51  Resp: 14 20  Temp: 99 F (37.2 C) 97.6 F (36.4 C)  SpO2: 94% 96%   Vitals:   03/24/20 2347 03/25/20 0425 03/25/20 0432 03/25/20 0809  BP: (!) 133/45 (!) 120/50  (!) 143/56  Pulse: (!) 51 (!) 52  (!) 51  Resp: 18 14  20   Temp: 98.7 F (37.1 C) 99 F (37.2 C)  97.6 F (36.4 C)  TempSrc: Oral Oral  Axillary  SpO2: 96% 94%  96%  Weight:   108.6 kg   Height:   6' (1.829 m)     General: Pt is alert, awake, not in acute distress Cardiovascular: RRR, S1/S2 +, no rubs, no gallops Respiratory: CTA bilaterally, no wheezing, no rhonchi Abdominal: Soft, NT, ND, bowel sounds + Extremities: no edema, no cyanosis    The results of significant diagnostics from this hospitalization (including imaging, microbiology, ancillary and laboratory) are listed below for reference.     Microbiology: Recent Results (from the past 240 hour(s))  MRSA PCR Screening     Status: None   Collection Time: 03/16/20 11:49 AM   Specimen: Nasal Mucosa; Nasopharyngeal  Result Value Ref Range Status   MRSA by PCR NEGATIVE NEGATIVE Final    Comment:        The GeneXpert MRSA Assay (FDA approved for NASAL specimens only), is one component of a comprehensive MRSA colonization surveillance program. It is not intended to diagnose MRSA infection nor to guide or monitor treatment for MRSA infections. Performed at [facility], [place] [address], [place], [place] [address]      Labs: BNP (last 3 results) No results for input(s): BNP in the last 8760 hours. Basic Metabolic Panel: Recent Labs  Lab 03/19/20 0340 03/20/20 0236 03/25/20 0753  NA 133* 133* 135  K 4.1 4.1 4.2  CL 104 105 106  CO2 22 21* 22  GLUCOSE 171* 152* 130*  BUN 26* 27* 29*  CREATININE 1.01 0.98 0.95  CALCIUM 8.2* 8.4* 8.6*   Liver Function  Tests: No results for input(s): AST, ALT, ALKPHOS, BILITOT, PROT, ALBUMIN in the last 168 hours. No results for input(s): LIPASE, AMYLASE in the last 168 hours. No results for input(s): AMMONIA in the last 168 hours. CBC: Recent Labs  Lab 03/19/20 0340 03/20/20 0236 03/21/20 0334 03/25/20 0753  WBC 6.3 6.9 7.3 4.4  NEUTROABS 4.4 4.8 5.2 3.4  HGB 11.4* 11.7* 11.3* 11.1*  HCT 35.2* 36.2* 35.1* 35.5*  MCV 89.6 90.0 91.2 91.3  PLT 235 271 288 269   Cardiac Enzymes: No results for input(s): CKTOTAL, CKMB, CKMBINDEX, TROPONINI in the last 168 hours. BNP: Invalid input(s):  POCBNP CBG: Recent Labs  Lab 03/24/20 0745 03/24/20 1307 03/24/20 1732 03/24/20 2110 03/25/20 0801  GLUCAP 125* 160* 200* 249* 116*   D-Dimer No results for input(s): DDIMER in the last 72 hours. Hgb A1c No results for input(s): HGBA1C in the last 72 hours. Lipid Profile No results for input(s): CHOL, HDL, LDLCALC, TRIG, CHOLHDL, LDLDIRECT in the last 72 hours. Thyroid function studies No results for input(s): TSH, T4TOTAL, T3FREE, THYROIDAB in the last 72 hours.  Invalid input(s): FREET3 Anemia work up No results for input(s): VITAMINB12, FOLATE, FERRITIN, TIBC, IRON, RETICCTPCT in the last 72 hours. Urinalysis    Component Value Date/Time   COLORURINE YELLOW 07/16/2012 [place] 07/16/2012 1345   LABSPEC 1.025 07/16/2012 1345   PHURINE 5.0 07/16/2012 1345   GLUCOSEU >1000 (A) 07/16/2012 1345   HGBUR NEGATIVE 07/16/2012 1345   BILIRUBINUR n 03/18/2015 0928   KETONESUR NEGATIVE 07/16/2012 1345   PROTEINUR n 03/18/2015 0928   PROTEINUR NEGATIVE 07/16/2012 1345   UROBILINOGEN 2.0 03/18/2015 0928   UROBILINOGEN 1.0 07/16/2012 1345   NITRITE n 03/18/2015 0928   NITRITE NEGATIVE 07/16/2012 1345   LEUKOCYTESUR Negative 03/18/2015 0928   Sepsis Labs Invalid input(s): PROCALCITONIN,  WBC,  LACTICIDVEN Microbiology Recent Results (from the past 240 hour(s))  MRSA PCR Screening      Status: None   Collection Time: 03/16/20 11:49 AM   Specimen: Nasal Mucosa; Nasopharyngeal  Result Value Ref Range Status   MRSA by PCR NEGATIVE NEGATIVE Final    Comment:        The GeneXpert MRSA Assay (FDA approved for NASAL specimens only), is one component of a comprehensive MRSA colonization surveillance program. It is not intended to diagnose MRSA infection nor to guide or monitor treatment for MRSA infections. Performed at [facility], [place] [address] [place], [place] [address]     Please note: You were cared for by a hospitalist during your hospital stay. Once you are discharged, your primary care physician will handle any further medical issues. Please note that NO REFILLS for any discharge medications will be authorized once you are discharged, as it is imperative that you return to your primary care physician (or establish a relationship with a primary care physician if you do not have one) for your post hospital discharge needs so that they can reassess your need for medications and monitor your lab values.    Time coordinating discharge: 40 minutes  SIGNED:   Shelly Coss, MD  [facility] 03/25/2020, 10:58 AM Pager [phone]  If 7PM-7AM, please contact night-coverage [email or web address] Password [email or web address]

## 2020-03-25 NOTE — IPOC Note (Signed)
Individualized overall Plan of Care [facility]) Patient Details Name: Brent [name]. MRN: [id number] DOB: Nov 01, 1945  Admitting Diagnosis: Quadriplegia and quadriparesis [facility])  Hospital Problems: Principal Problem:   Quadriplegia and quadriparesis ([place]) Active Problems:   Acute blood loss anemia   Leukopenia   Transaminitis   Controlled type 2 diabetes mellitus with hyperglycemia, with long-term current use of insulin (HCC)     Functional Problem List: Nursing Bladder, Bowel, Edema, Medication Management, Motor, Pain, Perception, Sensory, Skin Integrity  PT Balance, Endurance, Motor, Perception, Safety, Skin Integrity  OT Balance, Endurance, Motor, Sensory, Skin Integrity  SLP    TR         Basic ADL's: OT Eating, Grooming, Bathing, Dressing, Toileting     Advanced  ADL's: OT       Transfers: PT Bed Mobility, Bed to Chair, Car, Manufacturing systems engineer, Metallurgist: PT Ambulation, Emergency planning/management officer, Stairs     Additional Impairments: OT Fuctional Use of Upper Extremity  SLP        TR      Anticipated Outcomes Item Anticipated Outcome  Self Feeding min A  Swallowing      Basic self-care  mod A  Toileting  mod A   Bathroom Transfers dependent  Bowel/Bladder  maintain regular pattern of emptying bowel/bladder using program  Transfers  mod A with LRAD  Locomotion  N/A  Communication     Cognition     Pain  Less than 4  Safety/Judgment  remain free of infection, skin breakdown and falls   Therapy Plan: PT Intensity: Minimum of 1-2 x/day ,45 to 90 minutes PT Frequency: 5 out of 7 days PT Duration Estimated Length of Stay: 5 weeks OT Intensity: Minimum of 1-2 x/day, 45 to 90 minutes OT Frequency: 5 out of 7 days OT Duration/Estimated Length of Stay: 5 weeks      Team Interventions: Nursing Interventions Patient/Family Education, Bowel Management, Pain Management, Skin Care/Wound Management, Psychosocial Support, Bladder Management   PT interventions Discharge planning, Functional mobility training, Psychosocial support, Therapeutic Activities, Balance/vestibular training, Disease management/prevention, Neuromuscular re-education, Skin care/wound management, Therapeutic Exercise, Wheelchair propulsion/positioning, DME/adaptive equipment instruction, Pain management, UE/LE Strength taining/ROM, Community reintegration, Technical sales engineer stimulation, Patient/family education, UE/LE Coordination activities  OT Interventions Training and development officer, Neuromuscular re-education, Self Care/advanced ADL retraining, Wheelchair propulsion/positioning, DME/adaptive equipment instruction, Skin care/wound managment, UE/LE Strength taining/ROM, Academic librarian, Barrister's clerk education, UE/LE Coordination activities, Discharge planning, Functional mobility training, Therapeutic Activities  SLP Interventions    TR Interventions    SW/CM Interventions Discharge Planning, Psychosocial Support, Patient/Family Education   Barriers to Discharge MD  Medical stability, Weight, and unequivocal diagnosis  Nursing      PT Inaccessible home environment, Home environment access/layout, Incontinence, Neurogenic Bowel & Bladder quadriplegia, 2 STE with 0 rails, 1 flight to get to bedroom upstairs  OT Home environment access/layout    SLP      SW       Team Discharge Planning: Destination: PT-Home ,OT- Home , SLP-  Projected Follow-up: PT-Home health PT, OT-  Home health OT, SLP-  Projected Equipment Needs: PT-To be determined, OT- To be determined, Other (comment) (hospital bed), SLP-  Equipment Details: PT-pt has no equipment at home, OT-pending trunk control and transfer ability Patient/family involved in discharge planning: PT- Patient,  OT-Patient, SLP-   MD ELOS: 28-32 days. Medical Rehab Prognosis:  Fair Assessment: 74 year old right-handed male with history of remote MI stenting x3 in the 1990s maintained on aspirin,  diabetes  mellitus with neuropathy, hyperlipidemia, obesity with BMI 29.42.  He presented on 02/22/2020 with chest pain to his back associated shortness of breath and diaphoresis as well as reported some right leg weakness then quickly had bilateral lower extremity weakness as well as numbness in both hands.  Troponin unremarkable and chemistries within normal limits except glucose 229.  Chest x-ray no acute process.  CT angiogram of chest negative for aortic aneurysm or dissection.  No acute findings in the chest abdomen or pelvis.  MRI thoracic spine showed a 6 x 10 mm enhancing lesion at left T9 vertebral body appeared nonspecific.  No cord compression or cause for myelopathy identified.  MRI cervical spine normal enhancement no mass lesion.  Lumbar puncture CSF cell count 1, protein 53, IgG index normal.  Cranial CT/MRI scan unremarkable for acute intracranial process.  Neurology follow-up initial work-up for transverse myelitis however work-up had not been indicated of of that.  He was placed on high-dose IV Solu-Medrol x5 days starting 03/10/2020.  He completed course of steroids 03/14/2020.  Patient suddenly had acute shortness of breath respiratory distress around 5:45 PM on the evening of 03/15/2020.  Rapid response was called he was moved to the ICU.  Patient initially required nonrebreather.  Chest x-ray and abdominal films were done chest x-ray showed possibly infiltrates or atelectasis.  MRI cervical spine T-spine repeated by neurology which showed progressive cord edema extending from C4-5 to T4-5 with mild patchiness seen.  Progression again favored myelitis as well as NMO came back positive with serum MOG antibody negative and serum paraneoplastic panel also pending.  He was again started on IVIG 03/16/2020 and course has been completed.  Hospital course further complicated by HCAP and he was started on Maxipime for HCAP .  During hospital course and work-up cardiology services consulted in regards to  possible need for possibly atrial fibrillation being a contributor to spinal cord infarction recommendations were for 30-day cardiac event monitor which should be arranged at discharge and no further recommendations were made.  Patient with resulting functional deficits.  Gait, transfers, endurance, self-care. Will set goals for Mod A with PT/OT.  Due to the current state of emergency, patients may not be receiving their 3-hours of Medicare-mandated therapy.  See Team Conference Notes for weekly updates to the plan of care

## 2020-03-25 NOTE — Progress Notes (Signed)
Jamse Arn, MD  Physician  Physical Medicine and Rehabilitation  PMR Pre-admission     Addendum  Date of Service:  03/15/2020  5:23 PM      Related encounter: ED to Hosp-Admission (Discharged) from 03/08/2020 in [facility]       PMR Admission Coordinator Pre-Admission Assessment   Patient: Brent [name]. is an 74 y.o., male MRN: [id number] DOB: 1946/01/31 Height: 6' (182.9 cm) Weight: 102.7 kg                                                                                                                                                  Insurance Information HMO:     PPO:      PCP:      IPA:      80/20: yes     OTHER:  PRIMARY: Medicare A and B       Policy#: [id number]      Subscriber: patient CM Name:       Phone#:      Fax#:  Pre-Cert#:       Employer:  Benefits:  Phone #:      Name: verified eligibility online via [place] on 03/23/20 Eff. Date: Medicare Part A and B effective 08/19/2009;      Deduct: $1,484      Out of Pocket Max: NA      Life Max: NA  CIR: Covered per Medicare guidelines once yearly deductible has been met      SNF: days 1-20, 100%; days 21-100, 80% Outpatient: 80%     Co-Pay: 20% Home Health: 100%      Co-Pay:  DME: 80%     Co-Pay: 20% Providers: Pt's choice SECONDARY: [employer]      Policy#: [id number]      Phone#: [phone]   Financial Counselor:       Phone#:   The "Data Collection Information Summary" for patients in Inpatient Rehabilitation Facilities with attached "Privacy Act [place] Records" was provided and verbally reviewed with: Patient   Emergency Contact Information Contact Information       Name Relation Home Work Mobile    Bryson Spouse [phone]   [phone]         Current Medical History  Patient Admitting Diagnosis: 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    History of Present Illness:Brent [name] is a 74 year old right-handed male  with history of remote MI stenting x3 in the 1990s maintained on aspirin, diabetes mellitus with neuropathy, hyperlipidemia, obesity with BMI 29.42.  Per chart review lives with spouse and 21 year old grandson.  History taken from chart review patient and wife.  Reportedly independent prior to admission.  Two-level home half bath on main level bedroom upstairs.  Presented 03/08/2020 with chest pain radiating to his back associated shortness of breath  and diaphoresis as well as reported some right leg weakness then quickly had bilateral lower extremity weakness as well as numbness in both hands.  Troponin unremarkable and chemistries within normal limits except glucose 229.  Chest x-ray no acute process.  CT angiogram of chest negative for aortic aneurysm or dissection.  No acute findings in the chest abdomen or pelvis.  MRI thoracic spine showed a 6 x 10 mm enhancing lesion left T9 vertebral body appeared nonspecific.  No cord compression or cause for myelopathy identified.  MRI cervical spine normal enhancement no mass lesion.  Lumbar puncture CSF cell count 1, protein 53, IgG index normal.  Cranial CT/MRI scan negative.  Neurology follow-up initial work-up for transverse myelitis however work-up had not been indicated of of that.  He was placed on high-dose Solu-Medrol x5 days starting 03/10/2020.  He completed course of steroids 03/14/2020.  Patient suddenly had acute shortness of breath respiratory distress around 5:45 PM on the evening of 03/15/2020.  Rapid response was called he was moved to the ICU.  Patient initially required nonrebreather.  Chest x-ray and abdominal films were done chest x-ray showed possibly infiltrates or atelectasis.  MRI cervical spine T-spine repeated by neurology which showed progressive cord edema extending from C4-5 to T4-5 with mild patchiness seen.  Progression again favored myelitis as well as NMO came back positive with serum MOG antibody negative and serum paraneoplastic panel also  pending.Marland Kitchen  He was again started on IVIG 03/16/2020 and course has been completed. Per neurology on 7/6, the patient's clinical picture leans towards NMO and he was started on Rituxan. He will have an additional infusion planned in 2 weeks.  He completed a course of Maxipime for HCAP .  During hospital course and work-up cardiology services consulted in regards to possible need for possibly A. fib being a contributor to spinal cord infarction recommendations were for 30-day cardiac event monitor which should be arranged at discharge and no further recommendations were made. Pt is to admit to CIR on 03/25/20.      Past Medical History      Past Medical History:  Diagnosis Date  . Allergy    . CAD (coronary artery disease)      a. angioplasty of his RCA in 1990. b. bare metal stent placed in the RCA in 2000 followed by rotational atherectomy shortly after for stent restenosis. c. last cath was in 2012 showing stable moderate diffuse CAD. (70% mid LAD, 80% diagonal, 70% Ramus, 40% mid to distal RCA stent restenosis). d. Low risk nuc in 2015.  . Diabetes mellitus    . Diverticulosis    . Elevated CK    . Erectile dysfunction    . Hemorrhoids    . HTN (hypertension)    . Hyperlipidemia    . Hypertriglyceridemia    . Malignant melanoma of left side of neck ([place]) 10/25/2018  . Myocardial infarction ([place])    . Obesity    . OSA (obstructive sleep apnea)    . Persistent disorder of initiating or maintaining sleep    . Personal history of colonic polyps 02/05/2003      Family History  family history includes COPD in his mother; Heart disease in his father and sister; Liver cancer in his father; Lung cancer in his father and mother; Obesity in his mother.   Prior Rehab/Hospitalizations:  Has the patient had prior rehab or hospitalizations prior to admission? No   Has the patient had major surgery during 100 days prior  to admission? No   Current Medications    Current Facility-Administered  Medications:  .  0.9 %  sodium chloride infusion, , Intravenous, PRN, Donne Hazel, MD, Stopped at 03/10/20 2249 .  acetaminophen (TYLENOL) tablet 650 mg, 650 mg, Oral, Q6H PRN, 650 mg at 03/10/20 2144 **OR** acetaminophen (TYLENOL) suppository 650 mg, 650 mg, Rectal, Q6H PRN, Wynetta Fines T, MD .  aspirin EC tablet 81 mg, 81 mg, Oral, QHS, Lequita Halt, MD, 81 mg at 03/14/20 2149 .  docusate sodium (COLACE) capsule 100 mg, 100 mg, Oral, BID, Pahwani, Ravi, MD, 100 mg at 03/15/20 0944 .  enoxaparin (LOVENOX) injection 50 mg, 50 mg, Subcutaneous, Q24H, Pahwani, Ravi, MD, 50 mg at 03/15/20 0940 .  fenofibrate tablet 160 mg, 160 mg, Oral, QHS, Wynetta Fines T, MD, 160 mg at 03/14/20 2149 .  fluticasone (FLONASE) 50 MCG/ACT nasal spray 2 spray, 2 spray, Each Nare, QHS, Lequita Halt, MD, 2 spray at 03/14/20 2150 .  icosapent Ethyl (VASCEPA) 1 g capsule 2 g, 2 g, Oral, BID, Wynetta Fines T, MD, 2 g at 03/15/20 0943 .  insulin aspart (novoLOG) injection 0-5 Units, 0-5 Units, Subcutaneous, QHS, Lequita Halt, MD, 3 Units at 03/14/20 2155 .  insulin aspart (novoLOG) injection 0-9 Units, 0-9 Units, Subcutaneous, TID WC, Lequita Halt, MD, 2 Units at 03/14/20 1729 .  insulin aspart (novoLOG) injection 25 Units, 25 Units, Subcutaneous, TID AC, Lequita Halt, MD, 25 Units at 03/13/20 1719 .  insulin glargine (LANTUS) injection 60 Units, 60 Units, Subcutaneous, QHS, Lequita Halt, MD, 60 Units at 03/14/20 2149 .  lidocaine (LIDODERM) 5 % 1 patch, 1 patch, Transdermal, Q24H, Lequita Halt, MD, 1 patch at 03/14/20 2158 .  LORazepam (ATIVAN) tablet 0.5 mg, 0.5 mg, Oral, Q4H PRN, Mansy, Jan A, MD, 0.5 mg at 03/15/20 1701 .  LORazepam (ATIVAN) tablet 1 mg, 1 mg, Oral, QHS PRN, Wynetta Fines T, MD, 1 mg at 03/15/20 0039 .  multivitamin with minerals tablet 1 tablet, 1 tablet, Oral, Daily, Lequita Halt, MD, 1 tablet at 03/15/20 [phone] .  oxybutynin (DITROPAN) tablet 5 mg, 5 mg, Oral, BID, Wynetta Fines T, MD, 5 mg at  03/15/20 0944 .  pantoprazole (PROTONIX) EC tablet 40 mg, 40 mg, Oral, Daily, Aroor, Lanice Schwab, MD, 40 mg at 03/15/20 0943 .  polyethylene glycol (MIRALAX / GLYCOLAX) packet 17 g, 17 g, Oral, Daily, Pahwani, Ravi, MD, 17 g at 03/15/20 0944 .  rosuvastatin (CRESTOR) tablet 40 mg, 40 mg, Oral, QHS, Wynetta Fines T, MD, 40 mg at 03/14/20 2148 .  sertraline (ZOLOFT) tablet 150 mg, 150 mg, Oral, QHS, Wynetta Fines T, MD, 150 mg at 03/14/20 2147 .  sodium chloride (OCEAN) 0.65 % nasal spray 1 spray, 1 spray, Each Nare, PRN, Darliss Cheney, MD, 1 spray at 03/13/20 1054 .  traMADol (ULTRAM) tablet 50 mg, 50 mg, Oral, Q6H PRN, Donne Hazel, MD, 50 mg at 03/14/20 2148 .  vitamin B-12 (CYANOCOBALAMIN) tablet 500 mcg, 500 mcg, Oral, Daily, Wynetta Fines T, MD, 500 mcg at 03/15/20 0944 .  zolpidem (AMBIEN) tablet 10 mg, 10 mg, Oral, QHS, Wynetta Fines T, MD, 10 mg at 03/14/20 2148   Patients Current Diet:  Diet Order                  Diet heart healthy/carb modified Room service appropriate? No; Fluid consistency: Thin  Diet effective now  Precautions / Restrictions Precautions Precautions: Fall Precaution Comments: sensation around T6-9 Restrictions Weight Bearing Restrictions: No    Has the patient had 2 or more falls or a fall with injury in the past year?No   Prior Activity Level Community (5-7x/wk): retired from the post office, still driving, Independent with an AD PTA. lives with wife and 39 yo grandson    Prior Functional Level Prior Function Level of Independence: Independent   Self Care: Did the patient need help bathing, dressing, using the toilet or eating?  Independent   Indoor Mobility: Did the patient need assistance with walking from room to room (with or without device)? Independent   Stairs: Did the patient need assistance with internal or external stairs (with or without device)? Independent   Functional Cognition: Did the patient need help  planning regular tasks such as shopping or remembering to take medications? Independent   Home Assistive Devices / Equipment Home Assistive Devices/Equipment: None Home Equipment: None   Prior Device Use: Indicate devices/aids used by the patient prior to current illness, exacerbation or injury? None of the above   Current Functional Level Cognition   Overall Cognitive Status: Within Functional Limits for tasks assessed Orientation Level: Oriented X4 General Comments: pt agreeable and cooperative through session. Wife present and supportive    Extremity Assessment (includes Sensation/Coordination)   Upper Extremity Assessment: RUE deficits/detail, LUE deficits/detail RUE Deficits / Details: strength 5/5 in shoulder, biceps, triceps. Able to moblize fingers slightly but no functional grasp. Sensation appears WFL LUE Deficits / Details: strength 5/5 shoulder, 3/5 tricep, 5/5 bicep. No functional hand movement noted  Lower Extremity Assessment: Defer to PT evaluation RLE Deficits / Details: Strength 0/5. Passive range of motion WFL but difficult due to extensor spasms RLE Sensation: decreased light touch, decreased proprioception, history of peripheral neuropathy LLE Deficits / Details: Strength 0/5. Passive range of motion WFL but difficult due to extensor spasms LLE Sensation: decreased light touch, decreased proprioception, history of peripheral neuropathy     ADLs   Overall ADL's : Needs assistance/impaired Eating/Feeding: Set up, Bed level Eating/Feeding Details (indicate cue type and reason): issued pt U- cuff. Was able to feed self fruit with fork with set up while in chair position in bed. Grooming: Minimal assistance, Bed level Grooming Details (indicate cue type and reason): educated pt on using u cuff for brushing teeth, as well as compensating with electric toothbrush Upper Body Bathing: Maximal assistance, Sitting, Bed level Lower Body Bathing: Total assistance,  Sitting/lateral leans, Sit to/from stand, Bed level Upper Body Dressing : Sitting, Bed level, Maximal assistance Lower Body Dressing: Total assistance, Sitting/lateral leans, Sit to/from stand, Bed level Toilet Transfer: Total assistance Toilet Transfer Details (indicate cue type and reason): assumes full extensor tone when taken EOB Toileting- Clothing Manipulation and Hygiene: Total assistance General ADL Comments: session focused on assisting pt back to bed with lift and educating RN staff     Mobility   Overal bed mobility: Needs Assistance Bed Mobility: Rolling Rolling: Max assist Supine to sit: Total assist, +2 for physical assistance, +2 for safety/equipment Sit to supine: Total assist, +2 for physical assistance, +2 for safety/equipment General bed mobility comments: maxA to roll with pt using elbow to "hook" around OT arm or bed rail to assist in roll. unable to assist with movement of LE.     Transfers   Overall transfer level: Needs assistance Equipment used: Ambulation equipment used Transfer via Lift Equipment: [place] transfer comment: maximove used to assist patient back  to bed. BLEs placed in flexion to help break up tone     Ambulation / Gait / Stairs / Wheelchair Mobility   Ambulation/Gait Ambulation/Gait assistance:  (pt unable)     Posture / Balance Dynamic Sitting Balance Sitting balance - Comments: extensor tone limiting Balance Overall balance assessment: Needs assistance Sitting-balance support: Feet supported Sitting balance-Leahy Scale: Zero Sitting balance - Comments: extensor tone limiting     Special needs/care consideration BiPAP: order in acute Continuous Drip IV: cefepime Oxygen: on RA  Special Bed: gets chest physiotherapy in bed Skin: MASD to groin, scrotum (right; left)  Diabetic management: yes Special service needs: may need hoyer lift with RN staff and Designated visitor : wife [name]        Previous Environmental health practitioner (from  acute therapy documentation) Living Arrangements: Spouse/significant other, Other relatives (76 y/o grandson) Available Help at Discharge: Family, Available 24 hours/day Type of Home: House Home Layout: Two level, 1/2 bath on main level, Bed/bath upstairs Alternate Level Stairs-Number of Steps: flight Home Access: Stairs to enter Entrance Stairs-Rails: None Entrance Stairs-Number of Steps: 2 Bathroom Shower/Tub: Chiropodist: Standard Home Care Services: No   Discharge Living Setting Plans for Discharge Living Setting: House, Lives with (comment) (lives with wife and 70 yo grandson)  Type of Home at Discharge: House Discharge Home Layout: Two level, 1/2 bath on main level, Bed/bath upstairs Alternate Level Stairs-Rails: None; pt will need to stay downstairs Alternate Level Stairs-Number of Steps: NA Discharge Home Access: Stairs to enter (discussed ramp needs) Entrance Stairs-Rails: None Entrance Stairs-Number of Steps: 2-3 Discharge Bathroom Shower/Tub: Tub/shower unit Discharge Bathroom Toilet: Standard Discharge Bathroom Accessibility: Yes How Accessible: Accessible via walker Does the patient have any problems obtaining your medications?: No   Social/Family/Support Systems Patient Roles: Spouse, Other (Comment) (parent to his 41 yo grandson) Sport and exercise psychologist Information: [name] (wife): [phone] cell; home: [phone] Anticipated Caregiver: wife Anticipated Caregiver's Contact Information: see above Ability/Limitations of Caregiver: Min A Caregiver Availability: 24/7 Discharge Plan Discussed with Primary Caregiver: Yes Is Caregiver In Agreement with Plan?: Yes Does Caregiver/Family have Issues with Lodging/Transportation while Pt is in Rehab?: No     Goals Patient/Family Goal for Rehab: PT/OT: Mod A SLP: NA Expected length of stay: 27-32 days  Pt/Family Agrees to Admission and willing to participate: Yes Program Orientation Provided & Reviewed with  Pt/Caregiver Including Roles  & Responsibilities: Yes  Barriers to Discharge: Home environment access/layout, Neurogenic Bowel & Bladder, Lack of/limited family support  Barriers to Discharge Comments: steps to enter home; main bed/bath upstairs; new neurogenic b&b; pt understands SNF will need to be sought if he cannot progress to a level his wife can provide.      Decrease burden of Care through IP rehab admission: Specialzed equipment needs, Decrease number of caregivers, Bowel and bladder program and Patient/family education     Possible need for SNF placement upon discharge: possibly; pt's wife is limited in how much physical assist she can provide at baseline. If pt does not progress to a Min A level, anticipate pt may need SNF placement. Pt and family aware.       Patient Condition: This patient's medical and functional status has changed since the consult dated 03/11/20 in which the Rehabilitation Physician determined and documented that the patient was potentially appropriate for intensive rehabilitative care in an inpatient rehabilitation facility. Issues have been addressed and update has been discussed with Dr. Posey Pronto and patient now appropriate for inpatient rehabilitation. Pt has a diagnosis that  is amendable to CIR and his workup has been complete with nuerology strongly suspecting neuromyelitis optica. Will admit to inpatient rehab today.    Preadmission Screen Completed By:  Raechel Ache, OT, 03/15/2020 5:24 PM ______________________________________________________________________   Discussed status with Dr. Posey Pronto on 03/25/20 at 10:01AM and received approval for admission today.   Admission Coordinator:  Raechel Ache, time 10:01AM/Date 03/25/20.          Revision History                          Note Details  Author Jamse Arn, MD File Time 03/25/2020 10:55 AM  Author Type Physician Status Addendum  Last Editor Jamse Arn, MD Service Physical Medicine and  [facility] # [id number] Admit Date 03/25/2020

## 2020-03-26 ENCOUNTER — Inpatient Hospital Stay (HOSPITAL_COMMUNITY): Payer: Medicare Other

## 2020-03-26 ENCOUNTER — Inpatient Hospital Stay (HOSPITAL_COMMUNITY): Payer: Medicare Other | Admitting: Occupational Therapy

## 2020-03-26 DIAGNOSIS — G8250 Quadriplegia, unspecified: Secondary | ICD-10-CM

## 2020-03-26 DIAGNOSIS — D72819 Decreased white blood cell count, unspecified: Secondary | ICD-10-CM

## 2020-03-26 DIAGNOSIS — I10 Essential (primary) hypertension: Secondary | ICD-10-CM

## 2020-03-26 DIAGNOSIS — R739 Hyperglycemia, unspecified: Secondary | ICD-10-CM

## 2020-03-26 DIAGNOSIS — M7989 Other specified soft tissue disorders: Secondary | ICD-10-CM

## 2020-03-26 DIAGNOSIS — Z794 Long term (current) use of insulin: Secondary | ICD-10-CM

## 2020-03-26 DIAGNOSIS — E1165 Type 2 diabetes mellitus with hyperglycemia: Secondary | ICD-10-CM

## 2020-03-26 DIAGNOSIS — D62 Acute posthemorrhagic anemia: Secondary | ICD-10-CM

## 2020-03-26 DIAGNOSIS — R7401 Elevation of levels of liver transaminase levels: Secondary | ICD-10-CM

## 2020-03-26 LAB — CBC WITH DIFFERENTIAL/PLATELET
Abs Immature Granulocytes: 0.02 10*3/uL (ref 0.00–0.07)
Basophils Absolute: 0 10*3/uL (ref 0.0–0.1)
Basophils Relative: 0 %
Eosinophils Absolute: 0.1 10*3/uL (ref 0.0–0.5)
Eosinophils Relative: 3 %
HCT: 35.5 % — ABNORMAL LOW (ref 39.0–52.0)
Hemoglobin: 11.1 g/dL — ABNORMAL LOW (ref 13.0–17.0)
Immature Granulocytes: 1 %
Lymphocytes Relative: 21 %
Lymphs Abs: 0.7 10*3/uL (ref 0.7–4.0)
MCH: 28.4 pg (ref 26.0–34.0)
MCHC: 31.3 g/dL (ref 30.0–36.0)
MCV: 90.8 fL (ref 80.0–100.0)
Monocytes Absolute: 0.2 10*3/uL (ref 0.1–1.0)
Monocytes Relative: 6 %
Neutro Abs: 2.3 10*3/uL (ref 1.7–7.7)
Neutrophils Relative %: 69 %
Platelets: 264 10*3/uL (ref 150–400)
RBC: 3.91 MIL/uL — ABNORMAL LOW (ref 4.22–5.81)
RDW: 13.3 % (ref 11.5–15.5)
WBC: 3.3 10*3/uL — ABNORMAL LOW (ref 4.0–10.5)
nRBC: 0 % (ref 0.0–0.2)

## 2020-03-26 LAB — COMPREHENSIVE METABOLIC PANEL
ALT: 48 U/L — ABNORMAL HIGH (ref 0–44)
AST: 55 U/L — ABNORMAL HIGH (ref 15–41)
Albumin: 1.8 g/dL — ABNORMAL LOW (ref 3.5–5.0)
Alkaline Phosphatase: 52 U/L (ref 38–126)
Anion gap: 8 (ref 5–15)
BUN: 24 mg/dL — ABNORMAL HIGH (ref 8–23)
CO2: 23 mmol/L (ref 22–32)
Calcium: 9 mg/dL (ref 8.9–10.3)
Chloride: 106 mmol/L (ref 98–111)
Creatinine, Ser: 0.84 mg/dL (ref 0.61–1.24)
GFR calc Af Amer: >60 mL/min (ref >60)
GFR calc non Af Amer: >60 mL/min (ref >60)
Glucose, Bld: 114 mg/dL — ABNORMAL HIGH (ref 70–99)
Potassium: 4.1 mmol/L (ref 3.5–5.1)
Sodium: 137 mmol/L (ref 135–145)
Total Bilirubin: 0.7 mg/dL (ref 0.3–1.2)
Total Protein: 6.7 g/dL (ref 6.5–8.1)

## 2020-03-26 LAB — GLUCOSE, CAPILLARY
Glucose-Capillary: 102 mg/dL — ABNORMAL HIGH (ref 70–99)
Glucose-Capillary: 137 mg/dL — ABNORMAL HIGH (ref 70–99)
Glucose-Capillary: 197 mg/dL — ABNORMAL HIGH (ref 70–99)
Glucose-Capillary: 194 mg/dL — ABNORMAL HIGH (ref 70–99)

## 2020-03-26 NOTE — Progress Notes (Signed)
[facility] Individual Statement of Services  Patient Name:  Brent [name].  Date:  03/26/2020  Welcome to the [facility].  Our goal is to provide you with an individualized program based on your diagnosis and situation, designed to meet your specific needs.  With this comprehensive rehabilitation program, you will be expected to participate in at least 3 hours of rehabilitation therapies Monday-Friday, with modified therapy programming on the weekends.  Your rehabilitation program will include the following services:  Physical Therapy (PT), Occupational Therapy (OT), Speech Therapy (ST), 24 hour per day rehabilitation nursing, Therapeutic Recreaction (TR), Neuropsychology, Care Coordinator, Rehabilitation Medicine, Nutrition Services, Pharmacy Services and Other  Weekly team conferences will be held on Wednesdays to discuss your progress.  Your Inpatient Rehabilitation Care Coordinator will talk with you frequently to get your input and to update you on team discussions.  Team conferences with you and your family in attendance may also be held.  Expected length of stay: 27-32 Days  Overall anticipated outcome: MOD A  Depending on your progress and recovery, your program may change. Your Inpatient Rehabilitation Care Coordinator will coordinate services and will keep you informed of any changes. Your Inpatient Rehabilitation Care Coordinator's name and contact numbers are listed  below.  The following services may also be recommended but are not provided by the [facility]:    [place] will be made to provide these services after discharge if needed.  Arrangements include referral to agencies that provide these services.  Your insurance has been verified to be:  Medicare Your primary doctor is:  [name], Olam Idler, MD  Pertinent information  will be shared with your doctor and your insurance company.  Inpatient Rehabilitation Care Coordinator:  Erlene Quan, [place] or [phone]  Information discussed with and copy given to patient by: Dyanne Iha, 03/26/2020, 9:24 AM

## 2020-03-26 NOTE — Evaluation (Signed)
Occupational Therapy Assessment and Plan  Patient Details  Name: Brent [name]. MRN: [id number] Date of Birth: [birth year]  OT Diagnosis: abnormal posture, muscle weakness (generalized) and quadriparesis at level C6 Rehab Potential: Rehab Potential (ACUTE ONLY): Fair ELOS: 5 weeks   Today's Date: 03/26/2020 OT Individual Time: 0800-0906   &   1100-1200 OT Individual Time Calculation (min): 66 min   & 60 min  Hospital Problem: Principal Problem:   Quadriplegia and quadriparesis ([name]) Active Problems:   Acute blood loss anemia   Leukopenia   Transaminitis   Controlled type 2 diabetes mellitus with hyperglycemia, with long-term current use of insulin (HCC)   Past Medical History:  Past Medical History:  Diagnosis Date  . Allergy   . CAD (coronary artery disease)    a. angioplasty of his RCA in 1990. b. bare metal stent placed in the RCA in 2000 followed by rotational atherectomy shortly after for stent restenosis. c. last cath was in 2012 showing stable moderate diffuse CAD. (70% mid LAD, 80% diagonal, 70% Ramus, 40% mid to distal RCA stent restenosis). d. Low risk nuc in 2015.  . Diabetes mellitus   . Diverticulosis   . Elevated CK   . Erectile dysfunction   . Hemorrhoids   . HTN (hypertension)   . Hyperlipidemia   . Hypertriglyceridemia   . Malignant melanoma of left side of neck ([place]) 10/25/2018  . Myocardial infarction ([place])   . Obesity   . OSA (obstructive sleep apnea)   . Persistent disorder of initiating or maintaining sleep   . Personal history of colonic polyps 02/05/2003   Past Surgical History:  Past Surgical History:  Procedure Laterality Date  . CHOLECYSTECTOMY    . CORONARY ANGIOPLASTY    . CORONARY STENT PLACEMENT     stenting of the right coronary artery with followup rotational  atherectomy. (3 stents placed)  . FINGER SURGERY     right  . FOOT SURGERY     right  . INGUINAL HERNIA REPAIR     right  . melanoma removal     neck  . ORTHOPEDIC  SURGERY     foot right  . rotator cuff surg     Bil    Assessment & Plan Clinical Impression: Patient is a 74 y.o. year old male with history of remote MI stenting x3 in the 1990s maintained on aspirin, diabetes mellitus with neuropathy, hyperlipidemia, obesity with BMI 29.42.  Per chart review lives with spouse and 4 year old grandson.  History taken from chart review and patient.  Reportedly independent prior to admission.  Two-level home half bath on main level bedroom upstairs.  He presented on 02/22/2020 with chest pain to his back associated shortness of breath and diaphoresis as well as reported some right leg weakness then quickly had bilateral lower extremity weakness as well as numbness in both hands.  Troponin unremarkable and chemistries within normal limits except glucose 229.  Chest x-ray no acute process.  CT angiogram of chest negative for aortic aneurysm or dissection.  No acute findings in the chest abdomen or pelvis.  MRI thoracic spine showed a 6 x 10 mm enhancing lesion at left T9 vertebral body appeared nonspecific.  No cord compression or cause for myelopathy identified.  MRI cervical spine normal enhancement no mass lesion.  Lumbar puncture CSF cell count 1, protein 53, IgG index normal.  Cranial CT/MRI scan unremarkable for acute intracranial process.  Neurology follow-up initial work-up for transverse myelitis however work-up had not been  indicated of of that.  He was placed on high-dose IV Solu-Medrol x5 days starting 03/10/2020.  He completed course of steroids 03/14/2020.  Patient suddenly had acute shortness of breath respiratory distress around 5:45 PM on the evening of 03/15/2020.  Rapid response was called he was moved to the ICU.  Patient initially required nonrebreather.  Chest x-ray and abdominal films were done chest x-ray showed possibly infiltrates or atelectasis.  MRI cervical spine T-spine repeated by neurology which showed progressive cord edema extending from C4-5 to  T4-5 with mild patchiness seen.  Progression again favored myelitis as well as NMO came back positive with serum MOG antibody negative and serum paraneoplastic panel also pending.  He was again started on IVIG 03/16/2020 and course has been completed.  Hospital course further complicated by HCAP and he was started on Maxipime for HCAP .  During hospital course and work-up cardiology services consulted in regards to possible need for possibly atrial fibrillation being a contributor to spinal cord infarction recommendations were for 30-day cardiac event monitor which should be arranged at discharge and no further recommendations were made.    Patient transferred to CIR on 03/25/2020 .    Patient currently requires total with basic self-care skills and IADL secondary to muscle weakness and muscle paralysis, decreased cardiorespiratoy endurance, abnormal tone, unbalanced muscle activation and motor apraxia and decreased sitting balance and decreased postural control.  Prior to hospitalization, patient could complete ADL/IADL with independent .  Patient will benefit from skilled intervention to decrease level of assist with basic self-care skills, increase independence with basic self-care skills and increase level of independence with iADL prior to discharge home with care partner.  Anticipate patient will require moderate physical assestance and follow up home health.  OT - End of Session Activity Tolerance: Tolerates 10 - 20 min activity with multiple rests Endurance Deficit: Yes Endurance Deficit Description: pt required rest breaks throughout session due to fatigue OT Assessment Rehab Potential (ACUTE ONLY): Fair OT Barriers to Discharge: Home environment access/layout OT Patient demonstrates impairments in the following area(s): Balance;Endurance;Motor;Sensory;Skin Integrity OT Basic ADL's Functional Problem(s): Eating;Grooming;Bathing;Dressing;Toileting OT Transfers Functional Problem(s):  Toilet;Tub/Shower OT Additional Impairment(s): Fuctional Use of Upper Extremity OT Plan OT Intensity: Minimum of 1-2 x/day, 45 to 90 minutes OT Frequency: 5 out of 7 days OT Duration/Estimated Length of Stay: 5 weeks OT Treatment/Interventions: Balance/vestibular training;Neuromuscular re-education;Self Care/advanced ADL retraining;Wheelchair propulsion/positioning;DME/adaptive equipment instruction;Skin care/wound managment;UE/LE Strength taining/ROM;Community reintegration;Patient/family education;UE/LE Coordination activities;Discharge planning;Functional mobility training;Therapeutic Activities OT Self Feeding Anticipated Outcome(s): min A OT Basic Self-Care Anticipated Outcome(s): mod A OT Toileting Anticipated Outcome(s): mod A OT Bathroom Transfers Anticipated Outcome(s): dependent OT Recommendation Recommendations for Other Services: Neuropsych consult;Therapeutic Recreation consult Therapeutic Recreation Interventions: Stress management Patient destination: Home Follow Up Recommendations: Home health OT Equipment Recommended: To be determined;Other (comment) (hospital bed) Equipment Details: pending trunk control and transfer ability   Skilled Therapeutic Intervention Session 1 - Patient in bed, alert and ready for therapy session.  Therapy evaluation completed as documented below - patient presents with impaired motor control in bilateral UEs and trunk, and absent motor control in LEs limiting his ability to move at bed level, complete functional transfers/mobility and perform self care tasks.  He is pleasant and cooperative, cognition and vision are intact - he is an excellent candidate for IP rehab.  Reviewed role of OT, schedule for therapy services, goals for therapy, plan of care, safety, skin care, weight shift/positioning.  He demonstrates good understanding and is motivated to participate.  He participated in eating, bathing and grooming activity completion and education  related to strategies for rolling in bed, use of assistive devices for eating, and positioning to maximize performance.  He requires built up handles for eating with mod A and cues for handling techniques.  Bathing requires max A/dependent for thoroughness, dependent to change soiled brief.  Rolling in bed dependent.  Oral care - he is able to get toothbrush to mouth but has difficulty with maintaining pressure for thorough cleaning - overall max A to complete.  He remained in bed at close of session with bed alarm set and call bell in hand - he is able to use call bell and smart phone when in reach and propped for optimal reach.    Session 2 - patient in bed, alert and ready for OT session.  Completed rolling activities in bed - improved to max A with cues and carryover of strategy, change of wet brief at dependent level, reviewed constant urine leakage with nursing who will follow up with MD.  LB dressing dependent, UB dressing max A - used rails and HOB elevated with max A forward lean to pull shirt down over back.  Provided therapy ball to promote right hand MP/IP flexion and grasp.  He remained in bed at close of session with tech to perform dopplers bilateral LEs, bed alarm set and call bell in reach.    OT Evaluation Precautions/Restrictions  Precautions Precautions: Fall Precaution Comments: quadriplegia Restrictions Weight Bearing Restrictions: No Vital Signs Therapy Vitals Temp: 98.5 F (36.9 C) Temp Source: Oral Pulse Rate: 93 Resp: 19 BP: (!) 151/59 Patient Position (if appropriate): Lying Oxygen Therapy SpO2: 100 % O2 Device: Room Air Pain Pain Assessment Pain Scale: 0-10 Pain Score: 0-No pain Home Living/Prior Functioning Home Living Family/patient expects to be discharged to:: Private residence Living Arrangements: Spouse/significant other, Children Available Help at Discharge: Family, Available 24 hours/day Type of Home: House Home Access: Stairs to enter Engineer, site of Steps: 2 Entrance Stairs-Rails: None Home Layout: Two level, 1/2 bath on main level, Bed/bath upstairs Alternate Level Stairs-Number of Steps: flight Alternate Level Stairs-Rails: Right Bathroom Shower/Tub: Chiropodist: Standard Bathroom Accessibility: Yes Additional Comments: patient states that family installing a ramp at main entrance to home  Lives With: Spouse IADL History Occupation: Retired Type of Occupation: Actor Prior Function Level of Independence: Independent with basic ADLs, Independent with gait, Independent with homemaking with ambulation, Independent with transfers  Able to Take Stairs?: Yes Driving: Yes 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, fast food Comments: likes to golf ADL ADL Eating: Maximal assistance Where Assessed-Eating: Bed level Grooming: Maximal assistance Where Assessed-Grooming: Bed level Upper Body Bathing: Maximal assistance Where Assessed-Upper Body Bathing: Bed level Lower Body Bathing: Dependent Where Assessed-Lower Body Bathing: Bed level Upper Body Dressing: Maximal assistance Where Assessed-Upper Body Dressing: Bed level Lower Body Dressing: Dependent Where Assessed-Lower Body Dressing: Bed level Toileting: Dependent Where Assessed-Toileting: Bed level Vision Baseline Vision/History: Wears glasses Wears Glasses: At all times Patient Visual Report: No change from baseline Vision Assessment?: No apparent visual deficits Perception  Perception: Within Functional Limits Praxis Praxis: Intact Cognition Overall Cognitive Status: Within Functional Limits for tasks assessed Arousal/Alertness: Awake/alert Orientation Level: Person;Place;Situation Person: Oriented Place: Oriented Situation: Oriented Year: 2021 Month: July Day of Week: Correct Memory: Appears intact Immediate Memory Recall: Sock;Blue;Bed Memory Recall Sock: Without Cue Memory Recall Blue: Without  Cue Memory Recall Bed: Without Cue Awareness: Impaired Problem Solving: Impaired Safety/Judgment: Impaired Sensation Sensation  Light Touch: Appears Intact Proprioception: Impaired by gross assessment Additional Comments: decreased R medial malleoli >L Coordination Gross Motor Movements are Fluid and Coordinated: No Fine Motor Movements are Fluid and Coordinated: No Coordination and Movement Description: grossly uncoordinated due to quadriplegia, decreased balance/postural control, and generalized weakness Finger Nose Finger Test: mild dysmetria bilaterally Heel Shin Test: unable to perform bilaterally 9 Hole Peg Test: unable Motor  Motor Motor: Abnormal postural alignment and control Motor - Skilled Clinical Observations: grossly uncoordinated due to quadriplegia, decreased balance/postural control, and generalized weakness Mobility  Bed Mobility Bed Mobility: Rolling Right;Rolling Left;Supine to Sit Rolling Right: Total Assistance - Patient < 25% Rolling Left: Total Assistance - Patient < 25% Supine to Sit: 2 Helpers  Trunk/Postural Assessment  Cervical Assessment Cervical Assessment: Within Functional Limits Thoracic Assessment Thoracic Assessment: Exceptions to [facility] (mild kyphosis) Lumbar Assessment Lumbar Assessment: Exceptions to [facility] (posterior pelvic tilt) Postural Control Postural Control: Deficits on evaluation Righting Reactions: delayed  Balance Balance Balance Assessed: Yes Static Sitting Balance Static Sitting - Balance Support: Bilateral upper extremity supported;Feet supported Static Sitting - Level of Assistance: 3: Mod assist Dynamic Sitting Balance Dynamic Sitting - Balance Support: Bilateral upper extremity supported;Feet supported Dynamic Sitting - Level of Assistance: 1: +2 Total assist Extremity/Trunk Assessment RUE Assessment Passive Range of Motion (PROM) Comments: WFL Active Range of Motion (AROM) Comments: full at shoulder (supported by bed  surface), elbow, forearm/wrist  - MP flex/ext 1/4-1/2, trace at IPs General Strength Comments: grossly 4/5 shoulder, elbow, wrist LUE Assessment Passive Range of Motion (PROM) Comments: shoulder flex 120 otherwise [facility] Active Range of Motion (AROM) Comments: shoulder 3/4, elbow flex full, ext 3/4, forearm/wrist full, no digit flex/ext General Strength Comments: shoulder 3/4, elbow flex 4/5, ext 3/5, wrist/forearm 4/5, 0 digits - uses tenodesis motion for gross grasp     Refer to Care Plan for Long Term Goals  Recommendations for other services: Neuropsych and Therapeutic Recreation  Stress management   Discharge Criteria: Patient will be discharged from OT if patient refuses treatment 3 consecutive times without medical reason, if treatment goals not met, if there is a change in medical status, if patient makes no progress towards goals or if patient is discharged from hospital.  The above assessment, treatment plan, treatment alternatives and goals were discussed and mutually agreed upon: by patient  Carlos Levering 03/26/2020, 4:10 PM

## 2020-03-26 NOTE — Plan of Care (Signed)
  Problem: SCI BOWEL ELIMINATION Goal: RH STG SCI MANAGE BOWEL PROGRAM W/ASSIST OR AS APPROPRIATE Description: STG SCI Manage bowel program w/assist or as appropriate. Max assist Outcome: Progressing Flowsheets (Taken 03/26/2020 1525) STG: SCI Pt will manage bowels with assistance or as appropriate: Total assist   Problem: SCI BLADDER ELIMINATION Goal: RH STG SCI MANAGE BLADDER PROGRAM W/ASSISTANCE Description: Manage bladder with max assist Outcome: Progressing Flowsheets (Taken 03/26/2020 1525) STG: SCI Pt will manage bladder with assistance or as appropriate: 1-Total assistance   Problem: RH SKIN INTEGRITY Goal: RH STG SKIN FREE OF INFECTION/BREAKDOWN Description: Prevent skin breakdown with max assist Outcome: Progressing Goal: RH STG ABLE TO PERFORM INCISION/WOUND CARE W/ASSISTANCE Description: STG Able To Perform Incision/Wound Care With Assistance. Manage peri area redness with mod assist Outcome: Progressing Flowsheets (Taken 03/26/2020 1525) STG: Pt will be able to perform incision/wound care with assistance: 1-Total assistance   Problem: RH PAIN MANAGEMENT Goal: RH STG PAIN MANAGED AT OR BELOW PT'S PAIN GOAL Description: Less than 4 Outcome: Progressing   Problem: Consults Goal: RH SPINAL CORD INJURY PATIENT EDUCATION Description:  See Patient Education module for education specifics.  Outcome: Progressing

## 2020-03-26 NOTE — Progress Notes (Signed)
Inpatient Rehabilitation  Patient information reviewed and entered into eRehab system by Kamry Faraci M. Marvel Mcphillips, M.A., CCC/SLP, PPS Coordinator.  Information including medical coding, functional ability and quality indicators will be reviewed and updated through discharge.    

## 2020-03-26 NOTE — Progress Notes (Signed)
[facility] PHYSICAL MEDICINE & REHABILITATION PROGRESS NOTE  Subjective/Complaints: Patient seen sitting up in bed this morning, eating breakfast, working with therapies.  He states he slept fairly overnight due to being in a new place.  He is ready to begin therapies.  He has questions regarding redness in his groin.  Discussed with nursing.  ROS: Denies CP, SOB, N/V/D  Objective: Vital Signs: Blood pressure (!) 139/50, pulse (!) 51, temperature 99.4 F (37.4 C), temperature source Oral, resp. rate 18, height 6' (1.829 m), weight 108.4 kg, SpO2 96 %. No results found. Recent Labs    03/25/20 0753 03/26/20 0513  WBC 4.4 3.3*  HGB 11.1* 11.1*  HCT 35.5* 35.5*  PLT 269 264   Recent Labs    03/25/20 0753 03/26/20 0513  NA 135 137  K 4.2 4.1  CL 106 106  CO2 22 23  GLUCOSE 130* 114*  BUN 29* 24*  CREATININE 0.95 0.84  CALCIUM 8.6* 9.0    Physical Exam: BP (!) 139/50   Pulse (!) 51   Temp 99.4 F (37.4 C) (Oral)   Resp 18   Ht 6' (1.829 m)   Wt 108.4 kg   SpO2 96%   BMI 32.41 kg/m  Constitutional: No distress . Vital signs reviewed.  Obese. HENT: Normocephalic.  Atraumatic. Eyes: EOMI. No discharge. Cardiovascular: No JVD. Respiratory: Normal effort.  No stridor. GI: Non-distended. Skin: Erythema in inguinal area Psych: Normal mood.  Normal behavior. Musc: No edema in extremities.  No tenderness in extremities. Neuro: Alert Aware of deficits Motor:  RUE: Shoulder abduction, elbow flexion 5/5, wrist extension 4+/5, elbow ext 4+/5, hand grip 1+/5 LUE: Shoulder abduction, elbow flexion 5/5, wrist extension 4+/5, elbow ext 3+/5, hand grip 1/5 B/l LE: 0/5 proxima to distal, unchanged  Assessment/Plan: 1. Functional deficits secondary to quadriparesis which require 3+ hours per day of interdisciplinary therapy in a comprehensive inpatient rehab setting.  Physiatrist is providing close team supervision and 24 hour management of active medical problems listed  below.  Physiatrist and rehab team continue to assess barriers to discharge/monitor patient progress toward functional and medical goals  Care Tool:  Bathing              Bathing assist       Upper Body Dressing/Undressing Upper body dressing   What is the patient wearing?: Hospital gown only    Upper body assist Assist Level: Maximal Assistance - Patient 25 - 49%    Lower Body Dressing/Undressing Lower body dressing      What is the patient wearing?: Incontinence brief     Lower body assist Assist for lower body dressing: 2 Helpers     Toileting Toileting    Toileting assist Assist for toileting: 2 Helpers     Transfers Chair/bed transfer  Transfers assist           Locomotion Ambulation   Ambulation assist              Walk 10 feet activity   Assist           Walk 50 feet activity   Assist           Walk 150 feet activity   Assist           Walk 10 feet on uneven surface  activity   Assist           Wheelchair     Assist  Wheelchair 50 feet with 2 turns activity    Assist            Wheelchair 150 feet activity     Assist            Medical Problem List and Plan: 1.  Quadriparesis secondary to spinal cord infarct versus transverse myelitis/NMO.  IV IG completed 03/20/2020 as well as received Rituxan dose 03/24/2020.  Begin CIR evaluations 2.  Antithrombotics: -DVT/anticoagulation: Lovenox                Vascular study ordered             -antiplatelet therapy: Aspirin 81 mg daily 3. Pain Management: Lidoderm patch, tramadol as needed  Monitor with increased exertion 4. Mood: Zoloft 150 mg nightly, Ativan as needed             -antipsychotic agents: N/A 5. Neuropsych: This patient is capable of making decisions on his own behalf. 6. Skin/Wound Care: Routine skin checks 7. Fluids/Electrolytes/Nutrition: Routine in and outs. CMP ordered 8.  Diabetes mellitus type  2 with hyperglycemia.  Hemoglobin A1c 6.7.    On Jardiance 12.5 mg daily, Metformin 1000 mg twice daily, Lantus 30 units, NovoLog 25-30 units 3 times daily PTA  Lantus insulin 30 units daily  Home medications discussed with pharmacy  Monitor the increased mobility  9. HCAP/OSA.  Completed course of Maxipime initiated 03/18/2020 x14 doses.  Continue BiPAP 10. Hyperlipidemia. Lipitor/Vascepa 11. Hypertension.  Lisinopril 20 mg daily  Monitor with increased mobility 12.  Transaminitis  LFTs elevated on 7/8   Continue to monitor 13.  Leukopenia-likely medication induced  WBCs 3.3 on 7/8  Continue to monitor 14.  Acute blood loss anemia  Hemoglobin 11.1 on 7/8  Continue to monitor  LOS: 1 days A FACE TO FACE EVALUATION WAS PERFORMED  Timothee Gali Lorie Phenix 03/26/2020, 8:42 AM

## 2020-03-26 NOTE — Evaluation (Signed)
Physical Therapy Assessment and Plan  Patient Details  Name: Brent [name]. MRN: [id number] Date of Birth: Feb 04, 1946  PT Diagnosis: Abnormal posture, Difficulty walking, Muscle spasms, Muscle weakness and Quadriplegia Rehab Potential: Fair ELOS: 5 weeks   Today's Date: 03/26/2020 PT Individual Time: 1330-1445 PT Individual Time Calculation (min): 75 min    Hospital Problem: Principal Problem:   Quadriplegia and quadriparesis ([place])   Past Medical History:  Past Medical History:  Diagnosis Date  . Allergy   . CAD (coronary artery disease)    a. angioplasty of his RCA in 1990. b. bare metal stent placed in the RCA in 2000 followed by rotational atherectomy shortly after for stent restenosis. c. last cath was in 2012 showing stable moderate diffuse CAD. (70% mid LAD, 80% diagonal, 70% Ramus, 40% mid to distal RCA stent restenosis). d. Low risk nuc in 2015.  . Diabetes mellitus   . Diverticulosis   . Elevated CK   . Erectile dysfunction   . Hemorrhoids   . HTN (hypertension)   . Hyperlipidemia   . Hypertriglyceridemia   . Malignant melanoma of left side of neck ([place]) 10/25/2018  . Myocardial infarction ([place])   . Obesity   . OSA (obstructive sleep apnea)   . Persistent disorder of initiating or maintaining sleep   . Personal history of colonic polyps 02/05/2003   Past Surgical History:  Past Surgical History:  Procedure Laterality Date  . CHOLECYSTECTOMY    . CORONARY ANGIOPLASTY    . CORONARY STENT PLACEMENT     stenting of the right coronary artery with followup rotational  atherectomy. (3 stents placed)  . FINGER SURGERY     right  . FOOT SURGERY     right  . INGUINAL HERNIA REPAIR     right  . melanoma removal     neck  . ORTHOPEDIC SURGERY     foot right  . rotator cuff surg     Bil    Assessment & Plan Clinical Impression: Patient is a 74 y.o. year old male with history of remote MI stenting x3 in the 1990s maintained on aspirin, diabetes mellitus with  neuropathy, hyperlipidemia, obesity with BMI 29.42.  Per chart review lives with spouse and 76 year old grandson.  History taken from chart review and patient.  Reportedly independent prior to admission.  Two-level home half bath on main level bedroom upstairs.  He presented on 02/22/2020 with chest pain to his back associated shortness of breath and diaphoresis as well as reported some right leg weakness then quickly had bilateral lower extremity weakness as well as numbness in both hands.  Troponin unremarkable and chemistries within normal limits except glucose 229.  Chest x-ray no acute process.  CT angiogram of chest negative for aortic aneurysm or dissection.  No acute findings in the chest abdomen or pelvis.  MRI thoracic spine showed a 6 x 10 mm enhancing lesion at left T9 vertebral body appeared nonspecific.  No cord compression or cause for myelopathy identified.  MRI cervical spine normal enhancement no mass lesion.  Lumbar puncture CSF cell count 1, protein 53, IgG index normal.  Cranial CT/MRI scan unremarkable for acute intracranial process.  Neurology follow-up initial work-up for transverse myelitis however work-up had not been indicated of of that.  He was placed on high-dose IV Solu-Medrol x5 days starting 03/10/2020.  He completed course of steroids 03/14/2020.  Patient suddenly had acute shortness of breath respiratory distress around 5:45 PM on the evening of 03/15/2020.  Rapid  response was called he was moved to the ICU.  Patient initially required nonrebreather.  Chest x-ray and abdominal films were done chest x-ray showed possibly infiltrates or atelectasis.  MRI cervical spine T-spine repeated by neurology which showed progressive cord edema extending from C4-5 to T4-5 with mild patchiness seen.  Progression again favored myelitis as well as NMO came back positive with serum MOG antibody negative and serum paraneoplastic panel also pending.  He was again started on IVIG 03/16/2020 and course has  been completed.  Hospital course further complicated by HCAP and he was started on Maxipime for HCAP .  During hospital course and work-up cardiology services consulted in regards to possible need for possibly atrial fibrillation being a contributor to spinal cord infarction recommendations were for 30-day cardiac event monitor which should be arranged at discharge and no further recommendations were made.  Please see preadmission assessment from earlier today as well.  Patient currently requires total A +2 with mobility secondary to muscle weakness and muscle paralysis, abnormal tone and unbalanced muscle activation and decreased sitting balance, decreased postural control and decreased balance strategies.  Prior to hospitalization, patient was independent  with mobility and lived with Spouse in a House home.  Home access is 2Stairs to enter.  Patient will benefit from skilled PT intervention to maximize safe functional mobility, minimize fall risk and decrease caregiver burden for planned discharge home with 24 hour assist.  Anticipate patient will benefit from follow up [facility] at discharge.  PT - End of Session Activity Tolerance: Tolerates 30+ min activity with multiple rests Endurance Deficit: Yes Endurance Deficit Description: pt required rest breaks throughout session due to fatigue PT Assessment Rehab Potential (ACUTE/IP ONLY): Fair PT Barriers to Discharge: [place] home environment;Home environment access/layout;Incontinence;Neurogenic Bowel & Bladder PT Barriers to Discharge Comments: quadriplegia, 2 STE with 0 rails, 1 flight to get to bedroom upstairs PT Patient demonstrates impairments in the following area(s): Balance;Endurance;Motor;Perception;Safety;Skin Integrity PT Transfers Functional Problem(s): Bed Mobility;Bed to Chair;Car;Furniture PT Locomotion Functional Problem(s): Ambulation;Wheelchair Mobility;Stairs PT Plan PT Intensity: Minimum of 1-2 x/day ,45 to 90 minutes PT  Frequency: 5 out of 7 days PT Duration Estimated Length of Stay: 5 weeks PT Treatment/Interventions: Discharge planning;Functional mobility training;Psychosocial support;Therapeutic Activities;Balance/vestibular training;Disease management/prevention;Neuromuscular re-education;Skin care/wound management;Therapeutic Exercise;Wheelchair propulsion/positioning;DME/adaptive equipment instruction;Pain management;UE/LE Strength taining/ROM;Community reintegration;Functional electrical stimulation;Patient/family education;UE/LE Coordination activities PT Transfers Anticipated Outcome(s): mod A with LRAD PT Locomotion Anticipated Outcome(s): N/A PT Recommendation Recommendations for Other Services: Therapeutic Recreation consult Therapeutic Recreation Interventions: Stress management Follow Up Recommendations: Home health PT Patient destination: Home Equipment Recommended: To be determined Equipment Details: pt has no equipment at home  Skilled Therapeutic Intervention Evaluation completed (see details above and below) with education on PT POC and goals and individual treatment initiated with focus on functional mobility/transfers, generalized strengthening, dynamic sitting balance/coordination, and improved activity tolerance. Received pt supine in bed, pt educated on PT evaluation, CIR policies, and therapy schedule and agreeable. Pt denied any pain during session. RN contacted to disconnect suction cath and pt donned pants in supine with max A +2 for time management and rolled to L and R with max A and use of bedrails and required +2 assist to pull pants over hips. Pt transferred supine<>sitting EOB with HOB elevated and use of bedrails with max A +2 for trunk and LE management. Pt reported increased dizziness sitting EOB but resolved within 2 minutes. Pt initially required mod A to maintain static sitting balance fading to CGA within a few minutes. Pt transferred bed<>TIS  WC via slideboard max A +2 with  cues for anterior weight shifting, head/hips relationship, and scooting technique. Educated pt on pressure relief strategies in bed and in TIS WC and encouraged pt to call nursing to reposition. Therapist located Roho cushion and adjusted it for proper pressure relief. Pt transported to ortho gym in [facility] total A and performed bilateral UE strengthening on UBE for 1 minute and 40 seconds with emphasis on cardiovascular endurance but stopped due to fatigue. Pt transported back to room in TIS WC total A. Concluded session with pt semi-reclined in TIS WC, needs within reach, and seatbelt alarm on. Safety plan updated.   PT Evaluation Precautions/Restrictions Precautions Precautions: Fall Precaution Comments: quadriplegia Restrictions Weight Bearing Restrictions: No Home Living/Prior Functioning Home Living Available Help at Discharge: Family;Available 24 hours/day Type of Home: House Home Access: Stairs to enter [employer] of Steps: 2 Entrance Stairs-Rails: None Home Layout: Two level;1/2 bath on main level;Bed/bath upstairs Alternate Level Stairs-Number of Steps: flight Alternate Level Stairs-Rails: Right Bathroom Shower/Tub: Chiropodist: Standard Bathroom Accessibility: Yes Additional Comments: patient states that family installing a ramp at main entrance to home  Lives With: Spouse Prior Function Level of Independence: Independent with basic ADLs;Independent with gait;Independent with homemaking with ambulation;Independent with transfers  Able to Take Stairs?: Yes Driving: Yes 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, fast food Comments: likes to Water engineer Overall Cognitive Status: Within Functional Limits for tasks assessed Arousal/Alertness: Awake/alert Orientation Level: Oriented X4 Memory: Appears intact Awareness: Impaired Problem Solving: Impaired Safety/Judgment: Impaired Sensation Sensation Light Touch: Appears  Intact Proprioception: Impaired by gross assessment Additional Comments: decreased R medial malleoli >L Coordination Gross Motor Movements are Fluid and Coordinated: No Fine Motor Movements are Fluid and Coordinated: No Coordination and Movement Description: grossly uncoordinated due to quadriplegia, decreased balance/postural control, and generalized weakness Finger Nose Finger Test: mild dysmetria bilaterally Heel Shin Test: unable to perform bilaterally Motor  Motor Motor: Abnormal postural alignment and control Motor - Skilled Clinical Observations: grossly uncoordinated due to quadriplegia, decreased balance/postural control, and generalized weakness  Mobility Bed Mobility Bed Mobility: Rolling Right;Rolling Left;Supine to Sit Rolling Right: Maximal Assistance - Patient 25-49% Rolling Left: Maximal Assistance - Patient 25-49% Supine to Sit: 2 Helpers Transfers Transfers: Lateral/Scoot Transfers Lateral/Scoot Transfers: 2 Press photographer (Assistive device): Other (Comment) (slideboard) Locomotion  Gait Ambulation: No Gait Gait: No Stairs / Additional Locomotion Stairs: No Wheelchair Mobility Wheelchair Mobility: Yes Wheelchair Assistance: Dependent - Patient 0% Wheelchair Parts Management: Needs assistance Distance: 172f  Trunk/Postural Assessment  Cervical Assessment Cervical Assessment: Within Functional Limits Thoracic Assessment Thoracic Assessment: Exceptions to WFL (mild kyphosis) Lumbar Assessment Lumbar Assessment: Exceptions to [facility](posterior pelvic tilt) Postural Control Postural Control: Deficits on evaluation Righting Reactions: delayed  Balance Balance Balance Assessed: Yes Static Sitting Balance Static Sitting - Balance Support: Bilateral upper extremity supported;Feet supported Static Sitting - Level of Assistance: 3: Mod assist Dynamic Sitting Balance Dynamic Sitting - Balance Support: Bilateral upper extremity supported;Feet  supported Dynamic Sitting - Level of Assistance: 1: +2 Total assist Extremity Assessment  RLE Assessment RLE Assessment: Exceptions to [facility] Strength Comments: 0/5 due to quadriplegia LLE Assessment LLE Assessment: Exceptions to [facility] Strength Comments: 0/5 due to quadriplegia  Refer to Care Plan for Long Term Goals  Recommendations for other services: Therapeutic Recreation  Stress management  Discharge Criteria: Patient will be discharged from PT if patient refuses treatment 3 consecutive times without medical reason, if treatment goals not met, if there is a  change in medical status, if patient makes no progress towards goals or if patient is discharged from hospital.  The above assessment, treatment plan, treatment alternatives and goals were discussed and mutually agreed upon: by patient  Alfonse Alpers PT, DPT  03/26/2020, 7:24 AM

## 2020-03-26 NOTE — Discharge Instructions (Signed)
Inpatient Rehab Discharge Instructions  Brent [name]. Discharge date and time: No discharge date for patient encounter.   Activities/Precautions/ Functional Status: Activity: activity as tolerated Diet: diabetic diet Wound Care: none needed Functional status:  ___ No restrictions     ___ Walk up steps independently ___ 24/7 supervision/assistance   ___ Walk up steps with assistance ___ Intermittent supervision/assistance  ___ Bathe/dress independently ___ Walk with walker     _x__ Bathe/dress with assistance ___ Walk Independently    ___ Shower independently ___ Walk with assistance    ___ Shower with assistance ___ No alcohol     ___ Return to work/school ________  Special Instructions: No driving smoking or alcohol   My questions have been answered and I understand these instructions. I will adhere to these goals and the provided educational materials after my discharge from the hospital.  Patient/Caregiver Signature _______________________________ Date __________  Clinician Signature _______________________________________ Date __________  Please bring this form and your medication list with you to all your follow-up doctor's appointments.

## 2020-03-26 NOTE — Progress Notes (Signed)
Physical Therapy Session Note  Patient Details  Name: Brent [name]. MRN: [id number] Date of Birth: 1946-07-19  Today's Date: 03/26/2020 PT Individual Time: 1705-1730 PT Individual Time Calculation (min): 25 min   Short Term Goals: Week 1:  PT Short Term Goal 1 (Week 1): Pt will roll L and R with mod A and use of bed features PT Short Term Goal 2 (Week 1): Pt will transfer supine<>sitting EOB with max A of 1 PT Short Term Goal 3 (Week 1): Pt will transfer bed<>WC with LRAD max A of 1  Skilled Therapeutic Interventions/Progress Updates:  Pt received sitting in WC and agreeable to PT, requesting to get in bed. RN reports that she cannot find proper transfer equipment to assist with safe transfer back to bed. PT obtained maximove sling. Pt requesting to use lift rather than transfer board to return to bed. Attempted maxi move transfer, but first lift not working properly. Obtained additional maximove lift. Pt transported to bed in lift. Rolling R and L with mod assist each direction with assist to control pelvis and proper LE positioning to remove sling. Pt left in bed with call bell in reach and RN present    Therapy Documentation Precautions:  Precautions Precautions: Fall Precaution Comments: quadriplegia Restrictions Weight Bearing Restrictions: No    Vital Signs: Therapy Vitals Temp: 98.5 F (36.9 C) Temp Source: Oral Pulse Rate: 93 Resp: 19 BP: (!) 151/59 Patient Position (if appropriate): Lying Oxygen Therapy SpO2: 100 % O2 Device: Room Air Pain: Pain Assessment Pain Scale: 0-10 Pain Score: 0-No pain   Therapy/Group: Individual Therapy  Lorie Phenix 03/26/2020, 5:33 PM

## 2020-03-26 NOTE — Progress Notes (Signed)
Inpatient Rehabilitation Care Coordinator Assessment and Plan  Patient Details  Name: Brent [name]. MRN: [id number] Date of Birth: 06-10-1946  Today's Date: 03/26/2020  Problem List:  Patient Active Problem List   Diagnosis Date Noted  . Acute blood loss anemia   . Leukopenia   . Transaminitis   . Controlled type 2 diabetes mellitus with hyperglycemia, with long-term current use of insulin ([place])   . Spinal cord infarction ([place]) 03/25/2020  . Quadriplegia and quadriparesis ([place])   . Dyslipidemia   . HCAP (healthcare-associated pneumonia)   . Acute on chronic respiratory failure with hypoxia ([place])   . Acute hypoxemic respiratory failure ([place])   . Quadriplegia ([place])   . Coronary artery disease involving native coronary artery of native heart without angina pectoris   . Steroid-induced hyperglycemia   . Diabetic peripheral neuropathy ([place])   . Tachypnea   . Hyponatremia   . AKI (acute kidney injury) ([place])   . Weakness 03/09/2020  . Chest pain 03/08/2020  . Right leg numbness   . Malignant melanoma of left side of neck ([place]) 10/25/2018  . Facial neuritis 10/25/2018  . Myofascial pain syndrome 10/25/2018  . Occipital neuralgia of left side 10/25/2018  . CAP (community acquired pneumonia) 09/08/2017  . Viral URI with cough 08/02/2017  . Male hypogonadism 06/20/2016  . Renal lesion 06/20/2016  . Insomnia 08/07/2015  . Enlarged prostate with lower urinary tract symptoms (LUTS) 11/07/2014  . Balanitis 07/30/2012  . Bladder neck obstruction 07/30/2012  . Prostate nodule 06/18/2012  . Recurrent nephrolithiasis 06/18/2012  . Urinary urgency 06/18/2012  . Benign neoplasm of colon 08/29/2011  . DEPRESSION, SITUATIONAL, ACUTE 01/23/2010  . Essential hypertension 05/30/2009  . Coronary atherosclerosis 05/30/2009  . Obstructive sleep apnea 11/28/2008  . OBESITY 09/23/2008  . ERECTILE DYSFUNCTION 11/26/2007  . Well controlled type 2 diabetes mellitus with peripheral circulatory  disorder ([place]) 05/09/2007  . Hyperlipidemia 05/09/2007  . MYOCARDIAL INFARCTION, HX OF 05/09/2007  . DIVERTICULOSIS, COLON 05/09/2007   Past Medical History:  Past Medical History:  Diagnosis Date  . Allergy   . CAD (coronary artery disease)    a. angioplasty of his RCA in 1990. b. bare metal stent placed in the RCA in 2000 followed by rotational atherectomy shortly after for stent restenosis. c. last cath was in 2012 showing stable moderate diffuse CAD. (70% mid LAD, 80% diagonal, 70% Ramus, 40% mid to distal RCA stent restenosis). d. Low risk nuc in 2015.  . Diabetes mellitus   . Diverticulosis   . Elevated CK   . Erectile dysfunction   . Hemorrhoids   . HTN (hypertension)   . Hyperlipidemia   . Hypertriglyceridemia   . Malignant melanoma of left side of neck ([place]) 10/25/2018  . Myocardial infarction ([place])   . Obesity   . OSA (obstructive sleep apnea)   . Persistent disorder of initiating or maintaining sleep   . Personal history of colonic polyps 02/05/2003   Past Surgical History:  Past Surgical History:  Procedure Laterality Date  . CHOLECYSTECTOMY    . CORONARY ANGIOPLASTY    . CORONARY STENT PLACEMENT     stenting of the right coronary artery with followup rotational  atherectomy. (3 stents placed)  . FINGER SURGERY     right  . FOOT SURGERY     right  . INGUINAL HERNIA REPAIR     right  . melanoma removal     neck  . ORTHOPEDIC SURGERY     foot right  .  rotator cuff surg     [place]   Social History:  reports that he quit smoking about 39 years ago. His smoking use included cigarettes. He has a 45.00 pack-year smoking history. He has never used smokeless tobacco. He reports current alcohol use of about 7.0 - 14.0 standard drinks of alcohol per week. He reports that he does not use drugs.  Family / Support Systems Patient Roles: Spouse Spouse/Significant Other: Brent [name] Children: raising 32 year old grandson Anticipated Caregiver: Brent [name] Ability/Limitations of  Caregiver: Limited physical asisst Caregiver Availability: 24/7  Social History Preferred language: English Religion: Baptist Cultural Background: Retired Sales executive Read: Yes Write: Yes Employment Status: Retired   Abuse/Neglect Abuse/Neglect Assessment Can Be Completed: Yes Physical Abuse: Denies Verbal Abuse: Denies Sexual Abuse: Denies Exploitation of patient/patient's resources: Denies Self-Neglect: Denies  Emotional Status Pt's affect, behavior and adjustment status: no Recent Psychosocial Issues: no Psychiatric History: no Substance Abuse History: no  Patient / Family Perceptions, Expectations & Goals Pt/Family understanding of illness & functional limitations: yes Pt/family expectations/goals: Goal to discharge back home  [place]: None Premorbid Home Care/DME Agencies: None Transportation available at discharge: spouse able to transport  Discharge Planning Living Arrangements: Spouse/significant other, Children Support Systems: Spouse/significant other, Children Type of Residence: Private residence (2 story home: 3 steps to enter front door. Bedrooms on 2nd flooe 13 steps to get to 2nd floor) Insurance Resources: Medicare Living Expenses: Own Does the patient have any problems obtaining your medications?: No Care Coordinator Anticipated Follow Up Needs: HH/OP  Clinical Impression Sw entered room, introduced self, explained role and process. Patient pleasant wife present via telephone. Sw will continue to follow up with questions and concerns.  Dyanne Iha 03/26/2020, 10:29 AM

## 2020-03-26 NOTE — Progress Notes (Signed)
Bilateral lower extremity venous duplex has been completed. Preliminary results can be found in CV Proc through chart review.   03/26/20 12:59 PM Brent [name] RVT

## 2020-03-27 ENCOUNTER — Inpatient Hospital Stay (HOSPITAL_COMMUNITY): Payer: Medicare Other

## 2020-03-27 ENCOUNTER — Inpatient Hospital Stay (HOSPITAL_COMMUNITY): Payer: Medicare Other | Admitting: Occupational Therapy

## 2020-03-27 DIAGNOSIS — N319 Neuromuscular dysfunction of bladder, unspecified: Secondary | ICD-10-CM

## 2020-03-27 DIAGNOSIS — K592 Neurogenic bowel, not elsewhere classified: Secondary | ICD-10-CM

## 2020-03-27 LAB — MISC LABCORP TEST (SEND OUT): Labcorp test code: 9985

## 2020-03-27 LAB — GLUCOSE, CAPILLARY
Glucose-Capillary: 128 mg/dL — ABNORMAL HIGH (ref 70–99)
Glucose-Capillary: 172 mg/dL — ABNORMAL HIGH (ref 70–99)
Glucose-Capillary: 246 mg/dL — ABNORMAL HIGH (ref 70–99)
Glucose-Capillary: 200 mg/dL — ABNORMAL HIGH (ref 70–99)

## 2020-03-27 MED ORDER — BISACODYL 10 MG RE SUPP: 10.0000 mg | Freq: Every morning | RECTAL | Status: DC

## 2020-03-27 MED ORDER — BISACODYL 10 MG RE SUPP: 10.0000 mg | Freq: Every day | RECTAL | Status: DC

## 2020-03-27 MED ORDER — BISACODYL 10 MG RE SUPP
10.0000 mg | Freq: Every day | RECTAL | Status: DC
  Administered 2020-03-27 – 2020-04-05 (×7): 10 mg via RECTAL
  Filled 2020-03-27 (×8): qty 1

## 2020-03-27 NOTE — Progress Notes (Signed)
Occupational Therapy Session Note  Patient Details  Name: Brent [name]. MRN: [id number] Date of Birth: 09-04-1946  Today's Date: 03/27/2020 OT Individual Time: 0905-1002 OT Individual Time Calculation (min): 57 min    Short Term Goals: Week 1:  OT Short Term Goal 1 (Week 1): patient will increase bilateral hand ROM/dexterity and strength to perform eating and basic grooming tasks with min A OT Short Term Goal 2 (Week 1): patient will increase UB adl to mod A bed level - progress to seated in w/c as able OT Short Term Goal 3 (Week 1): patient will roll in bed with mod A and tolerate sitting in TIS w/c for 1 hour OT Short Term Goal 4 (Week 1): patient will direct lift transfer with mod cues OT Short Term Goal 5 (Week 1): patient will monitor and direct weight shifts with min cues  Skilled Therapeutic Interventions/Progress Updates:    Treatment session with focus on ADL retraining with bathing and dressing at bed level incorporating rolling for hygiene.  Pt received semi-reclined in bed agreeable to therapy session.  Engaged in [place] with +2 assist for rolling, pt able to maintain sidelying with UE support on bed rails.  Pt noted to be incontinent of bowel, with no awareness.  Completed hygiene in sidelying with rolling to don clean incontinence brief.  Engaged in LB bathing in upright sitting position in bed with bed support to maintain sitting position.  Educated on long sitting and circle sitting to assist in bathing lower legs, pt able to wash to knees with increased time due to decreased sustained grasp.  Total assist to thread BLE in to shorts and completed pulling pants over hips while rolling Rt and Lt with +2 assist for rolling.  Engaged in [place] bathing and dressing in supported sitting upright in bed.  Provided hand over hand assist to maintain grasp on wash cloth when washing chest and underarms - pt would benefit from use of wash mitt due to decreased sustained grasp during  activity.  Pt able to thread BUE in to shirt and pull shirt over head, requiring max assist for upright sitting balance when pulling shirt over head and then assist to pull down over back.  Utilized BUE to bring cup to mouth to drink from straw with setup to grasp cup.  Therapist donned thigh high TEDS and hospital socks. Pt remained in upright sitting position in bed with all needs in reach.  Therapy Documentation Precautions:  Precautions Precautions: Fall Precaution Comments: quadriplegia Restrictions Weight Bearing Restrictions: No Pain: Pain Assessment Pain Scale: 0-10 Pain Score: 0-No pain   Therapy/Group: Individual Therapy  Simonne Come 03/27/2020, 10:26 AM

## 2020-03-27 NOTE — Care Management (Signed)
Patient ID: Brent F Stangelo Jr., male   DOB: 10/02/1945, 73 y.o.   MRN: [id number] Met with patient and wife to review role of CM and collaboration with SW (Christina) to facilitate preparation for discharge. Reviewed neurogenic bowel and bladder management and prep for bladder catheterization instead of condom cath use and suppository with rectal vault check daily to decrease accidents and skin management for MASD. Reviewed vit B 12 deficit treatment with CMM diet for DM. Wife reported patient completed insulin administration himself. Discussed possible need for wife to administer at discharge if patient unable to due to fine motor control issues. Wife in agreement. No other nursing concerns noted at present.  

## 2020-03-27 NOTE — Progress Notes (Addendum)
[place] PHYSICAL MEDICINE & REHABILITATION PROGRESS NOTE  Subjective/Complaints: Patient seen sitting up in bed this morning.  He states he slept better overnight.  He states he had a tiring first day of therapies yesterday, but it is to be expected.  He notes need to be I/O caths yesterday.  ROS: Denies CP, SOB, N/V/D  Objective: Vital Signs: Blood pressure (!) 165/59, pulse (!) 56, temperature 98 F (36.7 C), resp. rate 18, height 6' (1.829 m), weight 108.4 kg, SpO2 95 %. VAS [place] LOWER EXTREMITY VENOUS (DVT)  Result Date: 03/26/2020  Lower Venous DVTStudy Indications: Swelling.  Risk Factors: None identified. Limitations: Poor ultrasound/tissue interface. Comparison Study: No prior studies. Performing Technologist: Oliver Hum RVT  Examination Guidelines: A complete evaluation includes B-mode imaging, spectral Doppler, color Doppler, and power Doppler as needed of all accessible portions of each vessel. Bilateral testing is considered an integral part of a complete examination. Limited examinations for reoccurring indications may be performed as noted. The reflux portion of the exam is performed with the patient in reverse Trendelenburg.  +---------+---------------+---------+-----------+----------+--------------+ RIGHT    CompressibilityPhasicitySpontaneityPropertiesThrombus Aging +---------+---------------+---------+-----------+----------+--------------+ CFV      Full           Yes      Yes                                 +---------+---------------+---------+-----------+----------+--------------+ SFJ      Full                                                        +---------+---------------+---------+-----------+----------+--------------+ FV Prox  Full                                                        +---------+---------------+---------+-----------+----------+--------------+ FV Mid   Full                                                         +---------+---------------+---------+-----------+----------+--------------+ FV DistalFull                                                        +---------+---------------+---------+-----------+----------+--------------+ PFV      Full                                                        +---------+---------------+---------+-----------+----------+--------------+ POP      Full           Yes      Yes                                 +---------+---------------+---------+-----------+----------+--------------+  PTV      Full                                                        +---------+---------------+---------+-----------+----------+--------------+ PERO     Full                                                        +---------+---------------+---------+-----------+----------+--------------+   +---------+---------------+---------+-----------+----------+--------------+ LEFT     CompressibilityPhasicitySpontaneityPropertiesThrombus Aging +---------+---------------+---------+-----------+----------+--------------+ CFV      Full           Yes      Yes                                 +---------+---------------+---------+-----------+----------+--------------+ SFJ      Full                                                        +---------+---------------+---------+-----------+----------+--------------+ FV Prox  Full                                                        +---------+---------------+---------+-----------+----------+--------------+ FV Mid   Full                                                        +---------+---------------+---------+-----------+----------+--------------+ FV DistalFull                                                        +---------+---------------+---------+-----------+----------+--------------+ PFV      Full                                                         +---------+---------------+---------+-----------+----------+--------------+ POP      Full           Yes      Yes                                 +---------+---------------+---------+-----------+----------+--------------+ PTV      Full                                                        +---------+---------------+---------+-----------+----------+--------------+  PERO     Full                                                        +---------+---------------+---------+-----------+----------+--------------+     Summary: RIGHT: - There is no evidence of deep vein thrombosis in the lower extremity.  - No cystic structure found in the popliteal fossa.  LEFT: - There is no evidence of deep vein thrombosis in the lower extremity.  - No cystic structure found in the popliteal fossa.  *See table(s) above for measurements and observations. Electronically signed by Servando Snare MD on 03/26/2020 at 8:18:20 PM.    Final    Recent Labs    03/25/20 0753 03/26/20 0513  WBC 4.4 3.3*  HGB 11.1* 11.1*  HCT 35.5* 35.5*  PLT 269 264   Recent Labs    03/25/20 0753 03/26/20 0513  NA 135 137  K 4.2 4.1  CL 106 106  CO2 22 23  GLUCOSE 130* 114*  BUN 29* 24*  CREATININE 0.95 0.84  CALCIUM 8.6* 9.0    Physical Exam: BP (!) 165/59   Pulse (!) 56   Temp 98 F (36.7 C)   Resp 18   Ht 6' (1.829 m)   Wt 108.4 kg   SpO2 95%   BMI 32.41 kg/m  Constitutional: No distress . Vital signs reviewed. HENT: Normocephalic.  Atraumatic. Eyes: EOMI. No discharge. Cardiovascular: No JVD. Respiratory: Normal effort.  No stridor. GI: Non-distended. Skin: Warm and dry.  Intact. Psych: Normal mood.  Normal behavior. Musc: No edema in extremities.  No tenderness in extremities. Neuro: Alert Aware of deficits Motor:  RUE: Shoulder abduction, elbow flexion 5/5, wrist extension 4+/5, elbow ext 4+/5, hand grip 1+/5, unchanged LUE: Shoulder abduction, elbow flexion 5/5, wrist extension 4+/5, elbow ext  4/5, hand grip 1/5 B/l LE: 0/5 proxima to distal, unchanged  Assessment/Plan: 1. Functional deficits secondary to quadriparesis which require 3+ hours per day of interdisciplinary therapy in a comprehensive inpatient rehab setting.  Physiatrist is providing close team supervision and 24 hour management of active medical problems listed below.  Physiatrist and rehab team continue to assess barriers to discharge/monitor patient progress toward functional and medical goals  Care Tool:  Bathing    Body parts bathed by patient: Chest, Abdomen, Left arm, Face   Body parts bathed by helper: Right arm, Left arm, Chest, Abdomen, Front perineal area, Buttocks, Right upper leg, Left upper leg, Right lower leg, Left lower leg, Face     Bathing assist Assist Level: Total Assistance - Patient < 25%     Upper Body Dressing/Undressing Upper body dressing   What is the patient wearing?: Pull over shirt    Upper body assist Assist Level: Total Assistance - Patient < 25%    Lower Body Dressing/Undressing Lower body dressing      What is the patient wearing?: Incontinence brief     Lower body assist Assist for lower body dressing: Total Assistance - Patient < 25%     Toileting Toileting    Toileting assist Assist for toileting: Dependent - Patient 0%     Transfers Chair/bed transfer  Transfers assist     Chair/bed transfer assist level: 2 Helpers (slideboard)     Locomotion Ambulation   Ambulation assist   Ambulation activity did not occur: Safety/medical concerns (  quadriplegia, decreased balance/postural control)          Walk 10 feet activity   Assist  Walk 10 feet activity did not occur: Safety/medical concerns (quadriplegia, decreased balance/postural control)        Walk 50 feet activity   Assist Walk 50 feet with 2 turns activity did not occur: Safety/medical concerns (quadriplegia, decreased balance/postural control)         Walk 150 feet  activity   Assist Walk 150 feet activity did not occur: Safety/medical concerns (quadriplegia, decreased balance/postural control)         Walk 10 feet on uneven surface  activity   Assist Walk 10 feet on uneven surfaces activity did not occur: Safety/medical concerns (quadriplegia, decreased balance/postural control)         Wheelchair     Assist Will patient use wheelchair at discharge?: Yes Type of Wheelchair: Manual    Wheelchair assist level: Dependent - Patient 0% Max wheelchair distance: 136ft    Wheelchair 50 feet with 2 turns activity    Assist        Assist Level: Dependent - Patient 0%   Wheelchair 150 feet activity     Assist     Assist Level: Dependent - Patient 0%      Medical Problem List and Plan: 1.  Quadriparesis secondary to spinal cord infarct versus transverse myelitis/NMO.  IV IG completed 03/20/2020 as well as received Rituxan dose 03/24/2020.  Continue CIR  B/l PRAFO ordered 2.  Antithrombotics: -DVT/anticoagulation: Lovenox                Vascular study ordered             -antiplatelet therapy: Aspirin 81 mg daily 3. Pain Management: Lidoderm patch, tramadol as needed  Monitor with increased exertion 4. Mood: Zoloft 150 mg nightly, Ativan as needed             -antipsychotic agents: N/A 5. Neuropsych: This patient is capable of making decisions on his own behalf. 6. Skin/Wound Care: Routine skin checks 7. Fluids/Electrolytes/Nutrition: Routine in and outs. CMP ordered 8.  Diabetes mellitus type 2 with hyperglycemia.  Hemoglobin A1c 6.7.    On Jardiance 12.5 mg daily, Metformin 1000 mg twice daily, Lantus 30 units, NovoLog 25-30 units 3 times daily PTA  Lantus insulin 30 units daily  Home medications discussed with pharmacy  Labile on 7/9, monitor for trend  Monitor the increased mobility  9. HCAP/OSA.  Completed course of Maxipime initiated 03/18/2020 x14 doses.  Continue BiPAP 10. Hyperlipidemia. Lipitor/Vascepa 11.  Hypertension.  Lisinopril 20 mg daily  Elevated on 7/9, will consider medication adjustments persistent  Monitor with increased mobility 12.  Transaminitis  LFTs elevated on 7/8  Continue to monitor 13.  Leukopenia-likely medication induced  WBCs 3.3 on 7/8, labs ordered for Monday  Continue to monitor 14.  Acute blood loss anemia  Hemoglobin 11.1 on 7/8, labs ordered for Monday  Continue to monitor 10.  Neurogenic bowel/bladder  I/O caths  Bowel program  LOS: 2 days A FACE TO FACE EVALUATION WAS PERFORMED  Troyce Gieske Lorie Phenix 03/27/2020, 8:17 AM

## 2020-03-27 NOTE — Plan of Care (Signed)
  Problem: RH PAIN MANAGEMENT Goal: RH STG PAIN MANAGED AT OR BELOW PT'S PAIN GOAL Description: Less than 4 Outcome: Progressing   Problem: SCI BLADDER ELIMINATION Goal: RH STG SCI MANAGE BLADDER PROGRAM W/ASSISTANCE Description: Manage bladder with max assist Outcome: Progressing

## 2020-03-27 NOTE — Progress Notes (Signed)
Occupational Therapy Session Note  Patient Details  Name: Brent [name]. MRN: [id number] Date of Birth: 1945-11-22  Today's Date: 03/27/2020 OT Individual Time: 0300-9233 OT Individual Time Calculation (min): 60 min    Short Term Goals: Week 1:  OT Short Term Goal 1 (Week 1): patient will increase bilateral hand ROM/dexterity and strength to perform eating and basic grooming tasks with min A OT Short Term Goal 2 (Week 1): patient will increase UB adl to mod A bed level - progress to seated in w/c as able OT Short Term Goal 3 (Week 1): patient will roll in bed with mod A and tolerate sitting in TIS w/c for 1 hour OT Short Term Goal 4 (Week 1): patient will direct lift transfer with mod cues OT Short Term Goal 5 (Week 1): patient will monitor and direct weight shifts with min cues  Skilled Therapeutic Interventions/Progress Updates:    Treatment session with focus on neck and shoulder ROM targeting potential trigger point in neck/upper trapezius, sitting balance, and rolling for hygiene.  Pt received upright in TIS w/c reporting feeling good sitting up for lunch.  Discussed use of built up u-cuff for self-feeding with pt reporting good success with self-feeding with Rt hand (Lt hand dominant).  Therapist directed pt in shoulder shrugs, cervical flexion, and neck rolls to attempt release trigger point.  Provided manipulation at tender spot with some manipulation and loosening.  Encouraged use of thera cane in future session to further target trigger point.  Completed transfer w/c > bed with use of slide board and Max +2 with facilitation for anterior weight shift.  Pt with good carryover of hand placement during transfer.  Engaged in sitting balance on EOB with pt demonstrating frequent LOB backwards, able to prop self for ~5-10 seconds while seated EOB with CGA.  Attempted reaching with RUE, pt unable to maintain sitting balance without BUE support.  Returned to supine in bed +2 for sequencing  and management of trunk and BLE.  Noted pt to be incontinent of bowel, therefore engaged in rolling with focus on sequencing of rolling.  Therapist completed hygiene and clothing management, +2 for rolling.  Discussed adaptive techniques for utilizing call bell/remote with pt demonstrating use of built up handle on phone stylus.  Pt remained semi-reclined in bed with all needs in reach.  Therapy Documentation Precautions:  Precautions Precautions: Fall Precaution Comments: quadriplegia Restrictions Weight Bearing Restrictions: No General:   Vital Signs: Therapy Vitals Temp: 98.6 F (37 C) Temp Source: Oral Pulse Rate: (!) 55 Resp: 18 BP: (!) 142/52 Patient Position (if appropriate): Sitting Oxygen Therapy SpO2: 99 % O2 Device: Room Air Pain:  Pt with no c/o pain   Therapy/Group: Individual Therapy  Simonne Come 03/27/2020, 3:22 PM

## 2020-03-27 NOTE — Progress Notes (Signed)
Orthopedic Tech Progress Note Patient Details:  Brent [name] 1946/05/27 [id number] Called in brace Patient ID: Brent Gip., male   DOB: August 04, 1946, 74 y.o.   MRN: [id number]   Brent [name] 03/27/2020, 11:47 AM

## 2020-03-27 NOTE — Progress Notes (Signed)
Physical Therapy Session Note  Patient Details  Name: Brent [name]. MRN: [id number] Date of Birth: 03-05-1946  Today's Date: 03/27/2020 PT Individual Time: 1100-1159 PT Individual Time Calculation (min): 59 min   Short Term Goals: Week 1:  PT Short Term Goal 1 (Week 1): Pt will roll L and R with mod A and use of bed features PT Short Term Goal 2 (Week 1): Pt will transfer supine<>sitting EOB with max A of 1 PT Short Term Goal 3 (Week 1): Pt will transfer bed<>WC with LRAD max A of 1  Skilled Therapeutic Interventions/Progress Updates:   Received pt supine in bed, pt agreeable to therapy, and denied any pain during session. Session with emphasis on functional mobility/transfers, UE strengthening, dynamic sitting balance/coordination, trunk control, and improved activity tolerance. Donned shoes supine in bed total A +2 for time management purposes. Pt rolled to R max A of 1 and transferred supine<>sitting EOB max A +2 for LE management and trunk control. Pt initially requiring mod A fading to CGA to maintain static sitting balance but pt's UEs frequently "giving out" and required mod A to correct. Pt reported increased dizziness upon sitting. Attempted to take BP however technical difficulties with Dynamap and symptoms decreased after sitting EOB for approximately 5 minutes. Therapist with goal to transfer bed<>WC via slideboard however, appropriate assist not available and for safety reasons transferred sit<>supine +2 assist and bed<>TIS WC via Maximove dependently with +2 assist. Noted pt with red pressure area along posterior head and notified RN. Pt transported to therapy gym in TIS WC total A and worked on dynamic sitting balance and trunk control tossing horseshoes with L UE x 2 trials with mod/max A for sitting balance to prevent falling anteriorly. Pt with increased difficulty with fine motor control and bilateral grip strength. Pt performed 2x10 bicep curls with 1lb dowel with therapist  repositioning dowel in pt's hands throughout activity. Pt transported back to room in TIS WC total A. Concluded session with pt semi-reclined in TIS WC, needs within reach, and seatbelt alarm on. Therapist provided drink/snack for pt.   Therapy Documentation Precautions:  Precautions Precautions: Fall Precaution Comments: quadriplegia Restrictions Weight Bearing Restrictions: No  Therapy/Group: Individual Therapy Alfonse Alpers PT, DPT   03/27/2020, 7:34 AM

## 2020-03-28 DIAGNOSIS — R7309 Other abnormal glucose: Secondary | ICD-10-CM

## 2020-03-28 DIAGNOSIS — G373 Acute transverse myelitis in demyelinating disease of central nervous system: Principal | ICD-10-CM

## 2020-03-28 DIAGNOSIS — R0989 Other specified symptoms and signs involving the circulatory and respiratory systems: Secondary | ICD-10-CM

## 2020-03-28 DIAGNOSIS — G360 Neuromyelitis optica [Devic]: Secondary | ICD-10-CM

## 2020-03-28 LAB — GLUCOSE, CAPILLARY
Glucose-Capillary: 147 mg/dL — ABNORMAL HIGH (ref 70–99)
Glucose-Capillary: 116 mg/dL — ABNORMAL HIGH (ref 70–99)
Glucose-Capillary: 164 mg/dL — ABNORMAL HIGH (ref 70–99)
Glucose-Capillary: 170 mg/dL — ABNORMAL HIGH (ref 70–99)

## 2020-03-28 MED ORDER — ACETAMINOPHEN 325 MG PO TABS
650.0000 mg | ORAL_TABLET | Freq: Four times a day (QID) | ORAL | Status: DC | PRN
  Administered 2020-03-28 – 2020-04-19 (×3): 650 mg via ORAL
  Filled 2020-03-28 (×2): qty 2

## 2020-03-28 MED ORDER — CHLORHEXIDINE GLUCONATE CLOTH 2 % EX PADS
6.0000 | MEDICATED_PAD | Freq: Every day | CUTANEOUS | Status: DC
  Administered 2020-03-29 – 2020-04-15 (×13): 6 via TOPICAL

## 2020-03-28 NOTE — Plan of Care (Signed)
  Problem: SCI BOWEL ELIMINATION Goal: RH STG SCI MANAGE BOWEL PROGRAM W/ASSIST OR AS APPROPRIATE Description: STG SCI Manage bowel program w/assist or as appropriate. Max assist Outcome: Progressing   Problem: SCI BLADDER ELIMINATION Goal: RH STG SCI MANAGE BLADDER PROGRAM W/ASSISTANCE Description: Manage bladder with max assist Outcome: Progressing   Problem: RH SKIN INTEGRITY Goal: RH STG SKIN FREE OF INFECTION/BREAKDOWN Description: Prevent skin breakdown with max assist Outcome: Progressing Goal: RH STG ABLE TO PERFORM INCISION/WOUND CARE W/ASSISTANCE Description: STG Able To Perform Incision/Wound Care With Assistance. Manage peri area redness with mod assist Outcome: Progressing   Problem: RH PAIN MANAGEMENT Goal: RH STG PAIN MANAGED AT OR BELOW PT'S PAIN GOAL Description: Less than 4 Outcome: Progressing   Problem: Consults Goal: RH SPINAL CORD INJURY PATIENT EDUCATION Description:  See Patient Education module for education specifics.  Outcome: Progressing

## 2020-03-28 NOTE — Progress Notes (Signed)
[place] PHYSICAL MEDICINE & REHABILITATION PROGRESS NOTE  Subjective/Complaints: Patient seen sitting up in bed this morning.  He states he slept well overnight.  He states he was able to tolerate his PRAFOs overnight.  He is questions regarding therapy scheduling.  ROS: Denies CP, SOB, N/V/D  Objective: Vital Signs: Blood pressure (!) 136/57, pulse (!) 55, temperature 98 F (36.7 C), temperature source Oral, resp. rate 16, height 6' (1.829 m), weight 108.4 kg, SpO2 96 %. VAS [place] LOWER EXTREMITY VENOUS (DVT)  Result Date: 03/26/2020  Lower Venous DVTStudy Indications: Swelling.  Risk Factors: None identified. Limitations: Poor ultrasound/tissue interface. Comparison Study: No prior studies. Performing Technologist: Oliver Hum RVT  Examination Guidelines: A complete evaluation includes B-mode imaging, spectral Doppler, color Doppler, and power Doppler as needed of all accessible portions of each vessel. Bilateral testing is considered an integral part of a complete examination. Limited examinations for reoccurring indications may be performed as noted. The reflux portion of the exam is performed with the patient in reverse Trendelenburg.  +---------+---------------+---------+-----------+----------+--------------+ RIGHT    CompressibilityPhasicitySpontaneityPropertiesThrombus Aging +---------+---------------+---------+-----------+----------+--------------+ CFV      Full           Yes      Yes                                 +---------+---------------+---------+-----------+----------+--------------+ SFJ      Full                                                        +---------+---------------+---------+-----------+----------+--------------+ FV Prox  Full                                                        +---------+---------------+---------+-----------+----------+--------------+ FV Mid   Full                                                         +---------+---------------+---------+-----------+----------+--------------+ FV DistalFull                                                        +---------+---------------+---------+-----------+----------+--------------+ PFV      Full                                                        +---------+---------------+---------+-----------+----------+--------------+ POP      Full           Yes      Yes                                 +---------+---------------+---------+-----------+----------+--------------+  PTV      Full                                                        +---------+---------------+---------+-----------+----------+--------------+ PERO     Full                                                        +---------+---------------+---------+-----------+----------+--------------+   +---------+---------------+---------+-----------+----------+--------------+ LEFT     CompressibilityPhasicitySpontaneityPropertiesThrombus Aging +---------+---------------+---------+-----------+----------+--------------+ CFV      Full           Yes      Yes                                 +---------+---------------+---------+-----------+----------+--------------+ SFJ      Full                                                        +---------+---------------+---------+-----------+----------+--------------+ FV Prox  Full                                                        +---------+---------------+---------+-----------+----------+--------------+ FV Mid   Full                                                        +---------+---------------+---------+-----------+----------+--------------+ FV DistalFull                                                        +---------+---------------+---------+-----------+----------+--------------+ PFV      Full                                                         +---------+---------------+---------+-----------+----------+--------------+ POP      Full           Yes      Yes                                 +---------+---------------+---------+-----------+----------+--------------+ PTV      Full                                                        +---------+---------------+---------+-----------+----------+--------------+  PERO     Full                                                        +---------+---------------+---------+-----------+----------+--------------+     Summary: RIGHT: - There is no evidence of deep vein thrombosis in the lower extremity.  - No cystic structure found in the popliteal fossa.  LEFT: - There is no evidence of deep vein thrombosis in the lower extremity.  - No cystic structure found in the popliteal fossa.  *See table(s) above for measurements and observations. Electronically signed by Servando Snare MD on 03/26/2020 at 8:18:20 PM.    Final    Recent Labs    03/26/20 0513  WBC 3.3*  HGB 11.1*  HCT 35.5*  PLT 264   Recent Labs    03/26/20 0513  NA 137  K 4.1  CL 106  CO2 23  GLUCOSE 114*  BUN 24*  CREATININE 0.84  CALCIUM 9.0    Physical Exam: BP (!) 136/57 (BP Location: Left Arm)   Pulse (!) 55 Comment: Asx bradycardia  Temp 98 F (36.7 C) (Oral)   Resp 16   Ht 6' (1.829 m)   Wt 108.4 kg   SpO2 96%   BMI 32.41 kg/m  Constitutional: No distress . Vital signs reviewed. HENT: Normocephalic.  Atraumatic. Eyes: EOMI. No discharge. Cardiovascular: No JVD. Respiratory: Normal effort.  No stridor. GI: Non-distended. Skin: Warm and dry.  Intact. Psych: Normal mood.  Normal behavior. Musc: No edema in extremities.  No tenderness in extremities. Neuro: Alert Aware of deficits Motor:  RUE: Shoulder abduction, elbow flexion 5/5, wrist extension 4+/5, elbow ext 4+/5, hand grip 1+/5, stable LUE: Shoulder abduction, elbow flexion 5/5, wrist extension 4+/5, elbow ext 4/5, hand grip 1/5, stable B/l LE:  0/5 proxima to distal, unchanged  Assessment/Plan: 1. Functional deficits secondary to quadriparesis which require 3+ hours per day of interdisciplinary therapy in a comprehensive inpatient rehab setting.  Physiatrist is providing close team supervision and 24 hour management of active medical problems listed below.  Physiatrist and rehab team continue to assess barriers to discharge/monitor patient progress toward functional and medical goals  Care Tool:  Bathing    Body parts bathed by patient: Chest, Abdomen, Left arm, Face   Body parts bathed by helper: Right arm, Left arm, Chest, Abdomen, Front perineal area, Buttocks, Right upper leg, Left upper leg, Right lower leg, Left lower leg, Face     Bathing assist Assist Level: Total Assistance - Patient < 25%     Upper Body Dressing/Undressing Upper body dressing Upper body dressing/undressing activity did not occur (including orthotics): N/A What is the patient wearing?: Pull over shirt    Upper body assist Assist Level: Maximal Assistance - Patient 25 - 49%    Lower Body Dressing/Undressing Lower body dressing      What is the patient wearing?: Incontinence brief     Lower body assist Assist for lower body dressing: 2 Helpers     Toileting Toileting    Toileting assist Assist for toileting: Dependent - Patient 0%     Transfers Chair/bed transfer  Transfers assist     Chair/bed transfer assist level: Dependent - mechanical lift     Locomotion Ambulation   Ambulation assist   Ambulation activity did not occur: Safety/medical concerns (quadriplegia, decreased  balance/postural control)          Walk 10 feet activity   Assist  Walk 10 feet activity did not occur: Safety/medical concerns (quadriplegia, decreased balance/postural control)        Walk 50 feet activity   Assist Walk 50 feet with 2 turns activity did not occur: Safety/medical concerns (quadriplegia, decreased balance/postural  control)         Walk 150 feet activity   Assist Walk 150 feet activity did not occur: Safety/medical concerns (quadriplegia, decreased balance/postural control)         Walk 10 feet on uneven surface  activity   Assist Walk 10 feet on uneven surfaces activity did not occur: Safety/medical concerns (quadriplegia, decreased balance/postural control)         Wheelchair     Assist Will patient use wheelchair at discharge?: Yes Type of Wheelchair: Manual    Wheelchair assist level: Dependent - Patient 0% Max wheelchair distance: 143ft    Wheelchair 50 feet with 2 turns activity    Assist        Assist Level: Dependent - Patient 0%   Wheelchair 150 feet activity     Assist     Assist Level: Dependent - Patient 0%      Medical Problem List and Plan: 1.  Quadriparesis secondary to transverse myelitis/NMO.  IV IG completed 03/20/2020 as well as received Rituxan dose 03/24/2020, plan for repeat dose ~7/20.  Continue CIR  B/l PRAFO nightly  Discussed with neurology diagnosis and treatment plan 2.  Antithrombotics: -DVT/anticoagulation: Lovenox                Vascular study ordered             -antiplatelet therapy: Aspirin 81 mg daily 3. Pain Management: Lidoderm patch, tramadol as needed  Controlled on 7/10  Monitor with increased exertion 4. Mood: Zoloft 150 mg nightly, Ativan as needed             -antipsychotic agents: N/A 5. Neuropsych: This patient is capable of making decisions on his own behalf. 6. Skin/Wound Care: Routine skin checks 7. Fluids/Electrolytes/Nutrition: Routine in and outs. CMP ordered 8.  Diabetes mellitus type 2 with hyperglycemia.  Hemoglobin A1c 6.7.    On Jardiance 12.5 mg daily, Metformin 1000 mg twice daily, Lantus 30 units, NovoLog 25-30 units 3 times daily PTA  Lantus insulin 30 units daily  Home medications discussed with pharmacy  Labile on 7/10, monitor for trend  Monitor the increased mobility  9. HCAP/OSA.   Completed course of Maxipime initiated 03/18/2020 x14 doses.  Continue BiPAP 10. Hyperlipidemia. Lipitor/Vascepa 11. Hypertension.  Lisinopril 20 mg daily  Labile on 7/10, monitor trend  Monitor with increased mobility 12.  Transaminitis  LFTs elevated on 7/8  Continue to monitor 13.  Leukopenia-likely medication induced  WBCs 3.3 on 7/8, labs ordered for Monday  Continue to monitor 14.  Acute blood loss anemia  Hemoglobin 11.1 on 7/8, labs ordered for Monday  Continue to monitor 10.  Neurogenic bowel/bladder  I/O caths  Bowel program  LOS: 3 days A FACE TO FACE EVALUATION WAS PERFORMED  Niara Bunker Lorie Phenix 03/28/2020, 10:43 AM

## 2020-03-29 ENCOUNTER — Inpatient Hospital Stay (HOSPITAL_COMMUNITY): Payer: Medicare Other | Admitting: Physical Therapy

## 2020-03-29 ENCOUNTER — Inpatient Hospital Stay (HOSPITAL_COMMUNITY): Payer: Medicare Other | Admitting: Occupational Therapy

## 2020-03-29 LAB — GLUCOSE, CAPILLARY
Glucose-Capillary: 103 mg/dL — ABNORMAL HIGH (ref 70–99)
Glucose-Capillary: 186 mg/dL — ABNORMAL HIGH (ref 70–99)
Glucose-Capillary: 164 mg/dL — ABNORMAL HIGH (ref 70–99)
Glucose-Capillary: 181 mg/dL — ABNORMAL HIGH (ref 70–99)

## 2020-03-29 MED ORDER — SENNA 8.6 MG PO TABS
1.0000 | ORAL_TABLET | Freq: Every day | ORAL | Status: DC
  Administered 2020-03-29 – 2020-03-30 (×2): 8.6 mg via ORAL
  Filled 2020-03-29 (×3): qty 1

## 2020-03-29 NOTE — Progress Notes (Signed)
[place] PHYSICAL MEDICINE & REHABILITATION PROGRESS NOTE  Subjective/Complaints: Patient seen sitting up in bed this morning.  He states he slept well overnight.  Discussed traumatic cath with nursing, Foley placed.  ROS: Denies CP, SOB, N/V/D  Objective: Vital Signs: Blood pressure (!) 146/56, pulse (!) 54, temperature 98.8 F (37.1 C), temperature source Oral, resp. rate 16, height 6' (1.829 m), weight 108.4 kg, SpO2 96 %. No results found. No results for input(s): WBC, HGB, HCT, PLT in the last 72 hours. No results for input(s): NA, K, CL, CO2, GLUCOSE, BUN, CREATININE, CALCIUM in the last 72 hours.  Physical Exam: BP (!) 146/56 (BP Location: Left Arm)   Pulse (!) 54   Temp 98.8 F (37.1 C) (Oral)   Resp 16   Ht 6' (1.829 m)   Wt 108.4 kg   SpO2 96%   BMI 32.41 kg/m  Constitutional: No distress . Vital signs reviewed. HENT: Normocephalic.  Atraumatic. Eyes: EOMI. No discharge. Cardiovascular: No JVD. Respiratory: Normal effort.  No stridor. GI: Non-distended. Skin: Warm and dry.  Intact. Psych: Normal mood.  Normal behavior. Musc: No edema in extremities.  No tenderness in extremities. Neuro: Alert Aware of deficits Motor:  RUE: Shoulder abduction, elbow flexion 5/5, wrist extension 4+/5, elbow ext 4+/5, hand grip 1+/5, unchanged LUE: Shoulder abduction, elbow flexion 5/5, wrist extension 4+/5, elbow ext 4/5, hand grip 1/5, unchanged B/l LE: 0/5 proxima to distal, unchanged  Assessment/Plan: 1. Functional deficits secondary to NMO/transverse myelitis which require 3+ hours per day of interdisciplinary therapy in a comprehensive inpatient rehab setting.  Physiatrist is providing close team supervision and 24 hour management of active medical problems listed below.  Physiatrist and rehab team continue to assess barriers to discharge/monitor patient progress toward functional and medical goals  Care Tool:  Bathing    Body parts bathed by patient: Chest,  Abdomen, Left arm, Face   Body parts bathed by helper: Right arm, Left arm, Chest, Abdomen, Front perineal area, Buttocks, Right upper leg, Left upper leg, Right lower leg, Left lower leg, Face     Bathing assist Assist Level: Total Assistance - Patient < 25%     Upper Body Dressing/Undressing Upper body dressing Upper body dressing/undressing activity did not occur (including orthotics): N/A What is the patient wearing?: Pull over shirt    Upper body assist Assist Level: Maximal Assistance - Patient 25 - 49%    Lower Body Dressing/Undressing Lower body dressing      What is the patient wearing?: Incontinence brief     Lower body assist Assist for lower body dressing: 2 Helpers     Toileting Toileting    Toileting assist Assist for toileting: Dependent - Patient 0%     Transfers Chair/bed transfer  Transfers assist     Chair/bed transfer assist level: Dependent - mechanical lift     Locomotion Ambulation   Ambulation assist   Ambulation activity did not occur: Safety/medical concerns (quadriplegia, decreased balance/postural control)          Walk 10 feet activity   Assist  Walk 10 feet activity did not occur: Safety/medical concerns (quadriplegia, decreased balance/postural control)        Walk 50 feet activity   Assist Walk 50 feet with 2 turns activity did not occur: Safety/medical concerns (quadriplegia, decreased balance/postural control)         Walk 150 feet activity   Assist Walk 150 feet activity did not occur: Safety/medical concerns (quadriplegia, decreased balance/postural control)  Walk 10 feet on uneven surface  activity   Assist Walk 10 feet on uneven surfaces activity did not occur: Safety/medical concerns (quadriplegia, decreased balance/postural control)         Wheelchair     Assist Will patient use wheelchair at discharge?: Yes Type of Wheelchair: Manual    Wheelchair assist level: Dependent -  Patient 0% Max wheelchair distance: 173ft    Wheelchair 50 feet with 2 turns activity    Assist        Assist Level: Dependent - Patient 0%   Wheelchair 150 feet activity     Assist     Assist Level: Dependent - Patient 0%      Medical Problem List and Plan: 1.  Quadriparesis secondary to transverse myelitis/NMO.  IV IG completed 03/20/2020 as well as received Rituxan dose 03/24/2020, plan for repeat dose ~7/20.  Continue CIR  B/l PRAFO nightly 2.  Antithrombotics: -DVT/anticoagulation: Lovenox                Vascular study negative for DVT             -antiplatelet therapy: Aspirin 81 mg daily 3. Pain Management: Lidoderm patch, tramadol as needed  Controlled on 7/11  Monitor with increased exertion 4. Mood: Zoloft 150 mg nightly, Ativan as needed             -antipsychotic agents: N/A 5. Neuropsych: This patient is capable of making decisions on his own behalf. 6. Skin/Wound Care: Routine skin checks 7. Fluids/Electrolytes/Nutrition: Routine in and outs. CMP ordered 8.  Diabetes mellitus type 2 with hyperglycemia.  Hemoglobin A1c 6.7.    On Jardiance 12.5 mg daily, Metformin 1000 mg twice daily, Lantus 30 units, NovoLog 25-30 units 3 times daily PTA  Lantus insulin 30 units daily  Home medications discussed with pharmacy  Slightly labile on 7/11  Monitor the increased mobility  9. HCAP/OSA.  Completed course of Maxipime initiated 03/18/2020 x14 doses.  Continue BiPAP 10. Hyperlipidemia. Lipitor/Vascepa 11. Hypertension.  Lisinopril 20 mg daily  Slightly labile on 7/11  Monitor with increased mobility 12.  Transaminitis  LFTs elevated on 7/8  Continue to monitor 13.  Leukopenia-likely medication induced  WBCs 3.3 on 7/8, labs ordered for tomorrow  Continue to monitor 14.  Acute blood loss anemia  Hemoglobin 11.1 on 7/8, labs ordered for tomorrow  Continue to monitor 10.  Neurogenic bowel/bladder  Indwelling Foley due to traumatic I/O caths  Bowel program  adjusted on 7/11  LOS: 4 days A FACE TO FACE EVALUATION WAS PERFORMED  Donya Hitch Lorie Phenix 03/29/2020, 7:59 AM

## 2020-03-29 NOTE — Plan of Care (Signed)
  Problem: SCI BOWEL ELIMINATION Goal: RH STG SCI MANAGE BOWEL PROGRAM W/ASSIST OR AS APPROPRIATE Description: STG SCI Manage bowel program w/assist or as appropriate. Max assist Outcome: Progressing   Problem: SCI BLADDER ELIMINATION Goal: RH STG SCI MANAGE BLADDER PROGRAM W/ASSISTANCE Description: Manage bladder with max assist Outcome: Progressing   Problem: RH SKIN INTEGRITY Goal: RH STG SKIN FREE OF INFECTION/BREAKDOWN Description: Prevent skin breakdown with max assist Outcome: Progressing Goal: RH STG ABLE TO PERFORM INCISION/WOUND CARE W/ASSISTANCE Description: STG Able To Perform Incision/Wound Care With Assistance. Manage peri area redness with mod assist Outcome: Progressing   Problem: RH PAIN MANAGEMENT Goal: RH STG PAIN MANAGED AT OR BELOW PT'S PAIN GOAL Description: Less than 4 Outcome: Progressing   Problem: Consults Goal: RH SPINAL CORD INJURY PATIENT EDUCATION Description:  See Patient Education module for education specifics.  Outcome: Progressing

## 2020-03-29 NOTE — Progress Notes (Signed)
Physical Therapy Session Note  Patient Details  Name: Brent [name]. MRN: [id number] Date of Birth: [birth year]  Today's Date: 03/29/2020 PT Individual Time: 1001-1058 and 7253-6644 PT Individual Time Calculation (min): 57 min and 31 min  Short Term Goals: Week 1:  PT Short Term Goal 1 (Week 1): Pt will roll L and R with mod A and use of bed features PT Short Term Goal 2 (Week 1): Pt will transfer supine<>sitting EOB with max A of 1 PT Short Term Goal 3 (Week 1): Pt will transfer bed<>WC with LRAD max A of 1  Skilled Therapeutic Interventions/Progress Updates:   First session:  Pt presents supine in bed but agreeable to therapy.  Pt tolerated LE stretches to increase ROM, elasticity.  Pt states relief during same.  PT performed long stretches to hip/knee extensors, adductors/IR's supine in bed.  Pt performed multiple rolls side to sidew/ max A and assist to hook-lying position and use of side rails.  Pt performed roll to right  And then to sit w/ total A although does initiate transition using UEs.  Pt sat EOB w/ feet flat and supported and able to maintain approx. 1-2'.  Pt states some dizziness but unable to check BP and maintain balance.  Pt returned to supine w/ total A and rolled to place lift pad under.  Pt transferred OOB w/ maximove and assist of NT.  Pt wheeled to gym and participated in seated reaching activities w/ max A for forward lean and maintaining trunk support. Pt returned to room and reclined in TIS w/ chair alarm on and all needs in reach.  Pillows placed to improve neutral rotation of hips in chair.    Second session:  Pt performed UE there ex to improve use during functional transfers.  Pt used 1# wand for biceps curls, shoulder flexion w/ underhand grip, chest press and overhead press w/ overhand grip 3 x 10.  Pt required "spotting" for LUE to increase ROM as well as assist w/ "grip" of bar.  Pt returned to room and TIS tilted back and chair alarm on, needs in reach.      Therapy Documentation Precautions:  Precautions Precautions: Fall Precaution Comments: quadriplegia Restrictions Weight Bearing Restrictions: No General:   Vital Signs:  Pain:0/10 Pain Assessment Pain Scale: 0-10 Pain Score: 0-No pain Mobility:      Therapy/Group: Individual Therapy  Ladoris Gene 03/29/2020, 11:07 AM

## 2020-03-29 NOTE — Progress Notes (Signed)
Occupational Therapy Session Note  Patient Details  Name: Brent [name]. MRN: [id number] Date of Birth: [birth year]  Today's Date: 03/29/2020 OT Individual Time: 0800-0900 and  1400-1444 OT Individual Time Calculation (min): 60 min and 44 mins   Short Term Goals: Week 1:  OT Short Term Goal 1 (Week 1): patient will increase bilateral hand ROM/dexterity and strength to perform eating and basic grooming tasks with min A OT Short Term Goal 2 (Week 1): patient will increase UB adl to mod A bed level - progress to seated in w/c as able OT Short Term Goal 3 (Week 1): patient will roll in bed with mod A and tolerate sitting in TIS w/c for 1 hour OT Short Term Goal 4 (Week 1): patient will direct lift transfer with mod cues OT Short Term Goal 5 (Week 1): patient will monitor and direct weight shifts with min cues  Skilled Therapeutic Interventions/Progress Updates:    Session 1: Upon entering the room, pt in bed with NT and eating breakfast. Pt able to use u cuff in R hand to eat several bites of pancakes with minimal spillage. OT also assisting with bringing food and drink to mouth in order to increase pt's food intake. Pt sitting into long sitting position and OT placing pt into circle sitting position for stretch and then also to thread pants onto feet. Pt attempted to assist but with little success. Once pants threaded onto B LEs and over knees pt was able to bring up a little further with B hands. Pt rolling L <> R with max A to pull up pants and OT noted pt have been incontinent of bowels. Pt was unaware of BM and needing total A for hygiene and to change brief and pull over B hips. Pt fatigued with rolling L <> R for hygiene and clothing management and taking rest break. Pt remained in bed at end of session with all needs within reach.   Session 2: Upon entering the room, pt seated in tilt in space wheelchair. Pt has been sitting up for about 3.5 hours and OT began education on pressure relief  in chair and demonstrated how that would feel/look in tilt in space. Pt transferred from tilt in space with maxi move back to bed. Pt repositioned in bed and sitting straight up in bed. OT attempting to have pt grab onto bed rail to come into long sitting but pt was unable secondary to grip strength. Pt was able to lean forward onto elbows with min A for balance for ~ 3 minutes. Heat applied to shoulders and pt performing gentle shoulder shrugs and circles. Heat monitored and removed with no adverse affects this session. Pt positioned in side lying for pressure relief. Call bell and all needed items within reach. Bed alarm activated.     Therapy Documentation Precautions:  Precautions Precautions: Fall Precaution Comments: quadriplegia Restrictions Weight Bearing Restrictions: No   Pain: Pain Assessment Pain Scale: 0-10 Pain Score: 0-No pain ADL: ADL Eating: Maximal assistance Where Assessed-Eating: Bed level Grooming: Maximal assistance Where Assessed-Grooming: Bed level Upper Body Bathing: Maximal assistance Where Assessed-Upper Body Bathing: Bed level Lower Body Bathing: Dependent Where Assessed-Lower Body Bathing: Bed level Upper Body Dressing: Maximal assistance Where Assessed-Upper Body Dressing: Bed level Lower Body Dressing: Dependent Where Assessed-Lower Body Dressing: Bed level Toileting: Dependent Where Assessed-Toileting: Bed level Vision   Perception    Praxis   Exercises:   Other Treatments:     Therapy/Group: Individual Therapy  Darleen Crocker  P 03/29/2020, 10:53 AM

## 2020-03-30 ENCOUNTER — Inpatient Hospital Stay (HOSPITAL_COMMUNITY): Payer: Medicare Other

## 2020-03-30 ENCOUNTER — Inpatient Hospital Stay (HOSPITAL_COMMUNITY): Payer: Medicare Other | Admitting: Occupational Therapy

## 2020-03-30 LAB — BASIC METABOLIC PANEL
Anion gap: 7 (ref 5–15)
BUN: 15 mg/dL (ref 8–23)
CO2: 25 mmol/L (ref 22–32)
Calcium: 8.8 mg/dL — ABNORMAL LOW (ref 8.9–10.3)
Chloride: 104 mmol/L (ref 98–111)
Creatinine, Ser: 0.66 mg/dL (ref 0.61–1.24)
GFR calc Af Amer: >60 mL/min (ref >60)
GFR calc non Af Amer: >60 mL/min (ref >60)
Glucose, Bld: 179 mg/dL — ABNORMAL HIGH (ref 70–99)
Potassium: 3.8 mmol/L (ref 3.5–5.1)
Sodium: 136 mmol/L (ref 135–145)

## 2020-03-30 LAB — CBC WITH DIFFERENTIAL/PLATELET
Abs Immature Granulocytes: 0.02 10*3/uL (ref 0.00–0.07)
Basophils Absolute: 0 10*3/uL (ref 0.0–0.1)
Basophils Relative: 1 %
Eosinophils Absolute: 0.1 10*3/uL (ref 0.0–0.5)
Eosinophils Relative: 3 %
HCT: 36.9 % — ABNORMAL LOW (ref 39.0–52.0)
Hemoglobin: 11.7 g/dL — ABNORMAL LOW (ref 13.0–17.0)
Immature Granulocytes: 1 %
Lymphocytes Relative: 25 %
Lymphs Abs: 0.8 10*3/uL (ref 0.7–4.0)
MCH: 28.9 pg (ref 26.0–34.0)
MCHC: 31.7 g/dL (ref 30.0–36.0)
MCV: 91.1 fL (ref 80.0–100.0)
Monocytes Absolute: 0.2 10*3/uL (ref 0.1–1.0)
Monocytes Relative: 6 %
Neutro Abs: 2.1 10*3/uL (ref 1.7–7.7)
Neutrophils Relative %: 64 %
Platelets: 263 10*3/uL (ref 150–400)
RBC: 4.05 MIL/uL — ABNORMAL LOW (ref 4.22–5.81)
RDW: 13 % (ref 11.5–15.5)
WBC: 3.3 10*3/uL — ABNORMAL LOW (ref 4.0–10.5)
nRBC: 0 % (ref 0.0–0.2)

## 2020-03-30 LAB — GLUCOSE, CAPILLARY
Glucose-Capillary: 109 mg/dL — ABNORMAL HIGH (ref 70–99)
Glucose-Capillary: 143 mg/dL — ABNORMAL HIGH (ref 70–99)
Glucose-Capillary: 136 mg/dL — ABNORMAL HIGH (ref 70–99)
Glucose-Capillary: 166 mg/dL — ABNORMAL HIGH (ref 70–99)

## 2020-03-30 NOTE — Progress Notes (Signed)
Occupational Therapy Session Note  Patient Details  Name: [name]. MRN: [id number] Date of Birth: 04/24/1946  Today's Date: 03/30/2020 OT Individual Time: 0900-1000 OT Individual Time Calculation (min): 60 min    Short Term Goals: Week 1:  OT Short Term Goal 1 (Week 1): patient will increase bilateral hand ROM/dexterity and strength to perform eating and basic grooming tasks with min A OT Short Term Goal 2 (Week 1): patient will increase UB adl to mod A bed level - progress to seated in w/c as able OT Short Term Goal 3 (Week 1): patient will roll in bed with mod A and tolerate sitting in TIS w/c for 1 hour OT Short Term Goal 4 (Week 1): patient will direct lift transfer with mod cues OT Short Term Goal 5 (Week 1): patient will monitor and direct weight shifts with min cues  Skilled Therapeutic Interventions/Progress Updates:    Patient in bed, alert and ready for therapy session.  He notes mild pain in scapular area that is relieved with change of position.  LB bathing and dressing completed bed level.  Max A of one to roll right and left.  Dependent for bathing, dressing and to change soiled incontinence brief.  Reviewed need for sidelying due to pressure injury to sacral area.  roho cushion inflation checked.  Maxi move for bed to TIS w/c dependent.  Completed grooming, UB bathing and dressing w/c level.  He requires min A for UB bathing, max A for OH shirt, min A for oral care and applying deodrant.  He remained in w/c at close of session in tilted position, seat belt alarm set and call bell in hand.    Therapy Documentation Precautions:  Precautions Precautions: Fall Precaution Comments: quadriplegia Restrictions Weight Bearing Restrictions: No   Therapy/Group: Individual Therapy  Carlos Levering 03/30/2020, 7:33 AM

## 2020-03-30 NOTE — Progress Notes (Addendum)
Physical Therapy Session Note  Patient Details  Name: Brent [name]. MRN: [id number] Date of Birth: 05-Mar-1946  Today's Date: 03/30/2020 PT Individual Time: 1350-1459 PT Individual Time Calculation (min): 69 min   Short Term Goals: Week 1:  PT Short Term Goal 1 (Week 1): Pt will roll L and R with mod A and use of bed features PT Short Term Goal 2 (Week 1): Pt will transfer supine<>sitting EOB with max A of 1 PT Short Term Goal 3 (Week 1): Pt will transfer bed<>WC with LRAD max A of 1  Skilled Therapeutic Interventions/Progress Updates:     Patient in bed asleep upon PT arrival. Patient easily aroused and agreeable to PT session. Patient denied pain during session, reported sensation of having a BM prior to mobility.   Therapeutic Activity: Bed Mobility: Patient performed rolling R/L with max A with second person to assist with maintaining side-lying while peri-car and LB clothing management were performed with total A. He performed supine to sit with max-mod A. Provided verbal cues for bending opposite LE, reaching with opposite UE, and turning head to roll, then cued patient to set bottom elbow to push to his elbow then hand to come to sitting. Transfers: Performed dependent transfer using Maxi move due to sacral breakdown and increased fatigue following NMR EOB, see below.   Neuromuscular Re-ed: Patient performed the following sitting balance activities EOB and UE motor control activities seated in TIS w/c: Sitting EOB: -static sitting balance progressing from mod A-close supervision with B UE support, progressed to single UE support and no UE support with CGA for steadying assist, performed alternating vertical reaching x4 with min A-CGA for sitting balance, provided cues for erect posture and cervical extension with reaching -performed knee extension in sitting with feet unsupported and B UE support with PROM progressing with minimal activation with AAROM 2x5 with PNF tapping and  min A-CGA for trunk support with cues for reduced retropulsion as compensatory mechanism for ROM -lateral leans x2 for trunk control while placing lift sling in sitting with min A for returning to sitting and total A for lifting LE to place sling Seated in TIS w/c: -ball toss with blue rubber ball 2x1.5-2 min increased distance of toss and variability of direction including mild reaching outside of BOS -B palms pressing into blue rubber ball for isometric muscle activation focused on L hand position and grip 2x5 -active L wrist flexion/extension with manual facilitation for finger flexion extension  Patient in TIS w/c with gait belt around thight for reduced hip abduction in sitting with Roho cushion checked for optimal inflation at end of session with breaks locked, seat belt alarm set, and all needs within reach. Adjusted head rest during session for improved head support in sitting.    Therapy Documentation Precautions:  Precautions Precautions: Fall Precaution Comments: quadriplegia Restrictions Weight Bearing Restrictions: No    Therapy/Group: Individual Therapy  Gloris Shiroma L Rondle Lohse PT, DPT  03/30/2020, 8:30 PM

## 2020-03-30 NOTE — Progress Notes (Signed)
[place] PHYSICAL MEDICINE & REHABILITATION PROGRESS NOTE  Subjective/Complaints:   Pt reports feels washed out and weak- no pain currently- LBM yesterday "sometime" doesn't remember any suppository or bowel program- has order, but don't see documentation last night- says has no control when has BM.   Has foley now due to traumatic cath.  Per bowel program note tonight, pt had results, and wife was taught foley care and bowel program.     ROS: Pt denies SOB, abd pain, CP, N/V/C/D, and vision changes   Objective: Vital Signs: Blood pressure (!) 157/95, pulse (!) 53, temperature 98.8 F (37.1 C), temperature source Oral, resp. rate 17, height 6' (1.829 m), weight 108.4 kg, SpO2 99 %. No results found. Recent Labs    03/30/20 0819  WBC 3.3*  HGB 11.7*  HCT 36.9*  PLT 263   Recent Labs    03/30/20 0819  NA 136  K 3.8  CL 104  CO2 25  GLUCOSE 179*  BUN 15  CREATININE 0.66  CALCIUM 8.8*    Physical Exam: BP (!) 157/95   Pulse (!) 53   Temp 98.8 F (37.1 C) (Oral)   Resp 17   Ht 6' (1.829 m)   Wt 108.4 kg   SpO2 99%   BMI 32.41 kg/m  Constitutional: No distress . Vital signs reviewed. Laying in bed- appropriate, but vague, NAD HENT: Normocephalic.  Atraumatic. Eyes: conjugate gaze Cardiovascular: no JVD Respiratory:no accessory muscle use GI: nondistended; soft Skin: Warm and dry.  Intact. Psych: vague, a little sign of poor memory. Musc: No edema in extremities.  No tenderness in extremities. Neuro: Alert Aware of deficits Motor:  RUE: Shoulder abduction, elbow flexion 5/5, wrist extension 4+/5, elbow ext 4+/5, hand grip 1+/5, unchanged LUE: Shoulder abduction, elbow flexion 5/5, wrist extension 4+/5, elbow ext 4/5, hand grip 1/5, unchanged B/l LE: 0/5 proxima to distal, unchanged  Assessment/Plan: 1. Functional deficits secondary to NMO/transverse myelitis which require 3+ hours per day of interdisciplinary therapy in a comprehensive inpatient rehab  setting.  Physiatrist is providing close team supervision and 24 hour management of active medical problems listed below.  Physiatrist and rehab team continue to assess barriers to discharge/monitor patient progress toward functional and medical goals  Care Tool:  Bathing    Body parts bathed by patient: Chest, Abdomen, Right arm, Left arm, Face   Body parts bathed by helper: Front perineal area, Buttocks, Right upper leg, Left upper leg, Right lower leg, Left lower leg     Bathing assist Assist Level: Moderate Assistance - Patient 50 - 74%     Upper Body Dressing/Undressing Upper body dressing Upper body dressing/undressing activity did not occur (including orthotics): N/A What is the patient wearing?: Pull over shirt    Upper body assist Assist Level: Maximal Assistance - Patient 25 - 49%    Lower Body Dressing/Undressing Lower body dressing      What is the patient wearing?: Pants, Incontinence brief     Lower body assist Assist for lower body dressing: Dependent - Patient 0%     Toileting Toileting    Toileting assist Assist for toileting: Dependent - Patient 0%     Transfers Chair/bed transfer  Transfers assist     Chair/bed transfer assist level: Dependent - mechanical lift     Locomotion Ambulation   Ambulation assist   Ambulation activity did not occur: Safety/medical concerns (quadriplegia, decreased balance/postural control)          Walk 10 feet activity  Assist  Walk 10 feet activity did not occur: Safety/medical concerns (quadriplegia, decreased balance/postural control)        Walk 50 feet activity   Assist Walk 50 feet with 2 turns activity did not occur: Safety/medical concerns (quadriplegia, decreased balance/postural control)         Walk 150 feet activity   Assist Walk 150 feet activity did not occur: Safety/medical concerns (quadriplegia, decreased balance/postural control)         Walk 10 feet on uneven  surface  activity   Assist Walk 10 feet on uneven surfaces activity did not occur: Safety/medical concerns (quadriplegia, decreased balance/postural control)         Wheelchair     Assist Will patient use wheelchair at discharge?: Yes Type of Wheelchair: Manual    Wheelchair assist level: Dependent - Patient 0% Max wheelchair distance: 172ft    Wheelchair 50 feet with 2 turns activity    Assist        Assist Level: Dependent - Patient 0%   Wheelchair 150 feet activity     Assist     Assist Level: Dependent - Patient 0%      Medical Problem List and Plan: 1.  Quadriparesis secondary to transverse myelitis/NMO.  IV IG completed 03/20/2020 as well as received Rituxan dose 03/24/2020, plan for repeat dose ~7/20.  Continue CIR  B/l PRAFO nightly 2.  Antithrombotics: -DVT/anticoagulation: Lovenox                Vascular study negative for DVT             -antiplatelet therapy: Aspirin 81 mg daily 3. Pain Management: Lidoderm patch, tramadol as needed  7/12- controlled pain  Monitor with increased exertion 4. Mood: Zoloft 150 mg nightly, Ativan as needed             -antipsychotic agents: N/A 5. Neuropsych: This patient is capable of making decisions on his own behalf. 6. Skin/Wound Care: Routine skin checks 7. Fluids/Electrolytes/Nutrition: Routine in and outs. CMP ordered 8.  Diabetes mellitus type 2 with hyperglycemia.  Hemoglobin A1c 6.7.    On Jardiance 12.5 mg daily, Metformin 1000 mg twice daily, Lantus 30 units, NovoLog 25-30 units 3 times daily PTA  Lantus insulin 30 units daily  Home medications discussed with pharmacy  7/12- BGs 109-181- better since this AM- con't to monitor  Monitor the increased mobility  9. HCAP/OSA.  Completed course of Maxipime initiated 03/18/2020 x14 doses.  Continue BiPAP 10. Hyperlipidemia. Lipitor/Vascepa 11. Hypertension.  Lisinopril 20 mg daily  Slightly labile on 7/11  Monitor with increased mobility 12.   Transaminitis  LFTs elevated on 7/8  Continue to monitor 13.  Leukopenia-likely medication induced  WBCs 3.3 on 7/8, labs ordered for tomorrow  7/12- WBC still 3.3  Continue to monitor 14.  Acute blood loss anemia  Hemoglobin 11.1 on 7/8, labs ordered for tomorrow  Continue to monitor 10.  Neurogenic bowel/bladder  Indwelling Foley due to traumatic I/O caths  Bowel program adjusted on 7/11  7/12- pt had bowel program this evening, not last night, of note- family ed is occurring  LOS: 5 days A FACE TO FACE EVALUATION WAS PERFORMED  Cullin Dishman 03/30/2020, 8:15 PM

## 2020-03-30 NOTE — Progress Notes (Signed)
Occupational Therapy Session Note  Patient Details  Name: Brent [name]. MRN: [id number] Date of Birth: 1946-01-23  Today's Date: 03/30/2020 OT Individual Time: 5361-4431 OT Individual Time Calculation (min): 54 min    Short Term Goals: Week 1:  OT Short Term Goal 1 (Week 1): patient will increase bilateral hand ROM/dexterity and strength to perform eating and basic grooming tasks with min A OT Short Term Goal 2 (Week 1): patient will increase UB adl to mod A bed level - progress to seated in w/c as able OT Short Term Goal 3 (Week 1): patient will roll in bed with mod A and tolerate sitting in TIS w/c for 1 hour OT Short Term Goal 4 (Week 1): patient will direct lift transfer with mod cues OT Short Term Goal 5 (Week 1): patient will monitor and direct weight shifts with min cues  Skilled Therapeutic Interventions/Progress Updates:    Treatment session with focus on transfers, static and dynamic sitting balance, and functional use of BUE.  Pt received up in TIS w/c agreeable to therapy session.  Completed slide board transfer Max assist +2 to facilitate anterior weight shift and clearance of buttocks during transfer.  Engaged in trunk control and obtaining/maintaining static sitting balance.  Utilized Geologist, engineering for visual feedback of sitting posture with therapist positioned behind to further facilitate trunk control.  Incorporated reaching for pegs in pegboard with Rt and then Lt hand to remove pegs and place in container on Rt.  Pt with improved grasp and manipulation of pegs in Rt hand compared to Lt.  Facilitation provided for weight shifting to place pegs in container on Rt.  Pt reports dizziness in sitting.  BP assessed 98/52, provided supported semi-reclined position and sips of water.  After rest BP up to 109/56 with pt still stating some dizziness. Pt tolerated OOB activity ~25 mins. Completed side board transfer mat > w/c > bed with Max assist +2 for weight shifting and clearance of  buttocks.  Educated on causes of dizziness and low BP (pt wearing thigh high TEDS).  Left pt semi-reclined in bed with all needs in reach and built up handle on stylus to select buttons on remote and phone.  Therapy Documentation Precautions:  Precautions Precautions: Fall Precaution Comments: quadriplegia Restrictions Weight Bearing Restrictions: No Pain:  Pt with c/o pain in Rt upper trap/neck.  Premedicated.   Therapy/Group: Individual Therapy  Simonne Come 03/30/2020, 12:17 PM

## 2020-03-30 NOTE — Progress Notes (Signed)
Bowel program executed with digital stimulation performed.  Resultant large bowel movement.  Wife, June, present at bedside for demonstrated teaching of digital stimulation, insulin administration and foley catheter care. Would benefit from repeat and reinforced teaching.  Carry Ortez Montey Hora, MSN, RN, CNL IP Rehab

## 2020-03-30 NOTE — Plan of Care (Signed)
Problem: SCI BOWEL ELIMINATION Goal: RH STG SCI MANAGE BOWEL PROGRAM W/ASSIST OR AS APPROPRIATE Description: STG SCI Manage bowel program w/assist or as appropriate. Max assist Outcome: Progressing   Problem: SCI BLADDER ELIMINATION Goal: RH STG SCI MANAGE BLADDER PROGRAM W/ASSISTANCE Description: Manage bladder with max assist Outcome: Progressing   Problem: RH SKIN INTEGRITY Goal: RH STG SKIN FREE OF INFECTION/BREAKDOWN Description: Prevent skin breakdown with max assist Outcome: Progressing   Problem: RH PAIN MANAGEMENT Goal: RH STG PAIN MANAGED AT OR BELOW PT'S PAIN GOAL Description: Less than 4 Outcome: Progressing

## 2020-03-31 ENCOUNTER — Inpatient Hospital Stay (HOSPITAL_COMMUNITY): Payer: Medicare Other | Admitting: Occupational Therapy

## 2020-03-31 ENCOUNTER — Inpatient Hospital Stay (HOSPITAL_COMMUNITY): Payer: Medicare Other | Admitting: Physical Therapy

## 2020-03-31 LAB — GLUCOSE, CAPILLARY
Glucose-Capillary: 114 mg/dL — ABNORMAL HIGH (ref 70–99)
Glucose-Capillary: 101 mg/dL — ABNORMAL HIGH (ref 70–99)
Glucose-Capillary: 131 mg/dL — ABNORMAL HIGH (ref 70–99)
Glucose-Capillary: 154 mg/dL — ABNORMAL HIGH (ref 70–99)

## 2020-03-31 MED ORDER — DOCUSATE SODIUM 100 MG PO CAPS
100.0000 mg | ORAL_CAPSULE | Freq: Every day | ORAL | Status: DC
  Administered 2020-04-01: 100 mg via ORAL
  Filled 2020-03-31: qty 1

## 2020-03-31 MED ORDER — METFORMIN HCL 500 MG PO TABS
500.0000 mg | ORAL_TABLET | Freq: Two times a day (BID) | ORAL | Status: DC
  Administered 2020-03-31 – 2020-04-05 (×11): 500 mg via ORAL
  Filled 2020-03-31 (×11): qty 1

## 2020-03-31 MED ORDER — LOPERAMIDE HCL 2 MG PO CAPS
2.0000 mg | ORAL_CAPSULE | ORAL | Status: DC | PRN
  Administered 2020-03-31: 2 mg via ORAL
  Filled 2020-03-31: qty 1

## 2020-03-31 NOTE — Progress Notes (Signed)
Occupational Therapy Session Note  Patient Details  Name: Demond Shallenberger. MRN: [id number] Date of Birth: May 20, 1946  Today's Date: 03/31/2020 OT Individual Time: 9323-5573 OT Individual Time Calculation (min): 62 min    Short Term Goals: Week 1:  OT Short Term Goal 1 (Week 1): patient will increase bilateral hand ROM/dexterity and strength to perform eating and basic grooming tasks with min A OT Short Term Goal 2 (Week 1): patient will increase UB adl to mod A bed level - progress to seated in w/c as able OT Short Term Goal 3 (Week 1): patient will roll in bed with mod A and tolerate sitting in TIS w/c for 1 hour OT Short Term Goal 4 (Week 1): patient will direct lift transfer with mod cues OT Short Term Goal 5 (Week 1): patient will monitor and direct weight shifts with min cues  Skilled Therapeutic Interventions/Progress Updates:    Patient in bed, alert and ready for therapy session.   He denies pain but states that he is tired due to poor sleep as he had multiple bowel movements over night and this am.  Completed LB bathing and dressing bed level - overall max A.   Dependent to change brief - needed to change 2 additional times during this session as he was having ongoing small bowel movements.  UB bathing and dressing completed bed level this session due to bowel issues.  UB bathing with min A, OH shirt mod A.  Oral care completed with set up.   Practiced forward lean in long sit position with max A.  He remained in bed at close of session in side lying position.  Bed alarm set and call bell in hand.    Therapy Documentation Precautions:  Precautions Precautions: Fall Precaution Comments: quadriplegia Restrictions Weight Bearing Restrictions: No   Therapy/Group: Individual Therapy  Carlos Levering 03/31/2020, 7:33 AM

## 2020-03-31 NOTE — Progress Notes (Signed)
Occupational Therapy Session Note  Patient Details  Name: Brent [name]. MRN: [id number] Date of Birth: Apr 09, 1946  Today's Date: 03/31/2020 OT Individual Time: 1100-1205 OT Individual Time Calculation (min): 65 min    Short Term Goals: Week 1:  OT Short Term Goal 1 (Week 1): patient will increase bilateral hand ROM/dexterity and strength to perform eating and basic grooming tasks with min A OT Short Term Goal 2 (Week 1): patient will increase UB adl to mod A bed level - progress to seated in w/c as able OT Short Term Goal 3 (Week 1): patient will roll in bed with mod A and tolerate sitting in TIS w/c for 1 hour OT Short Term Goal 4 (Week 1): patient will direct lift transfer with mod cues OT Short Term Goal 5 (Week 1): patient will monitor and direct weight shifts with min cues  Skilled Therapeutic Interventions/Progress Updates:    Treatment session with focus on bed mobility, sitting balance, and functional use of BUE.  Pt received supine in bed reporting having a rough night due to frequent BMs.  Engaged in rolling at bed level with focus on positioning of BLE to assist with rolling, completing rolling max assist with use of bed rails.  Assessed for incontinence, pt with no incontinence at beginning or end of session.  Engaged in bed mobility to come to sitting at EOB with max assist of one.  Engaged in sitting activity with focus on obtaining static sitting balance and progressing to weight shifting to engage in since UE support in sitting.  Pt required CGA to Mod assist for sitting balance depending on UE support, challenge demand, and fatigue.  Engaged in reaching incorporating crossing midline, requiring increased support for trunk control/stability.  Engaged in grasping activity with medium sized pegs, pt able to remove pegs with RUE and LUE but unable to grasp pegs from tabletop and manipulate to place in to peg board.  Attempted gross grasp with yellow resistive clothespins, pt  unable to grasp enough to squeeze to open clothespin.  Engaged in 2 sets of 10 chest presses increasing shoulder flexion against gravity and then challenging to add slight diagonal to challenge increased reach against gravity while challenging sitting balance.  Returned to supine and assessed for incontinence, none.  Utilized Maximove to transfer pt to TIS w/c to allow for increased OOB tolerance and increased success in upright supported position for self-feeding.  Pt remained tilted back in TIS w/c with gait belt around thighs to promote increased positioning and all needs in reach.  Therapy Documentation Precautions:  Precautions Precautions: Fall Precaution Comments: quadriplegia Restrictions Weight Bearing Restrictions: No Pain: Pain Assessment Pain Scale: 0-10 Pain Score: 0-No pain   Therapy/Group: Individual Therapy  Simonne Come 03/31/2020, 12:19 PM

## 2020-03-31 NOTE — Progress Notes (Signed)
Physical Therapy Session Note  Patient Details  Name: Brent [name]. MRN: [id number] Date of Birth: 1946-07-15  Today's Date: 03/31/2020 PT Individual Time: 1345-1435 PT Individual Time Calculation (min): 50 min   Short Term Goals: Week 1:  PT Short Term Goal 1 (Week 1): Pt will roll L and R with mod A and use of bed features PT Short Term Goal 2 (Week 1): Pt will transfer supine<>sitting EOB with max A of 1 PT Short Term Goal 3 (Week 1): Pt will transfer bed<>WC with LRAD max A of 1  Skilled Therapeutic Interventions/Progress Updates:    Pt received seated in TIS w/c in room, agreeable to PT session. No complaints of pain. Pt reports feeling very fatigued this date due to not sleeping last night due to frequent bowel incontinence. Pt agreeable to participate in session as able this date. Dependent transport via TIS w/c to/from therapy gym. Pt is assist x 2 in order to scoot forwards in chair to place feet on the ground prior to transfer. Slide board transfer w/c to mat table with max to total A +2. Once seated EOM pt requires mod to max A for sitting balance due to poor trunk control. Pt tends to fall anteriorly and posteriorly when he experiences LOB. Session focus on sitting balance EOM. Pt initially in "tripod" position propping himself up on his BUE. With posterior trunk support pt is able to walk his hands forward on mat table and place in his lap. Pt is able to maintain upright sitting position with hands in his lap x 1 min with mod A overall for trunk control. Pt fatigues quickly with this task and reports feeling short of breath. Pt recovers with seated and reclined rest break against therapy ball. Slide board transfer back to w/c with total A x 2 with 4" step under BLE for improved support. Slide board transfer back to bed in similar manner. Sit to supine assist x 2 for trunk control and LE management. Rolling L/R with max A and skilled cueing and assist for UE and LE placement to  assist with transfer in order to straighten out chuck pad. Pt left semi-reclined in bed with needs in reach. Pt missed 25 min of scheduled therapy session due to fatigue this date.  Therapy Documentation Precautions:  Precautions Precautions: Fall Precaution Comments: quadriplegia Restrictions Weight Bearing Restrictions: No General: PT Amount of Missed Time (min): 25 Minutes PT Missed Treatment Reason: Patient fatigue    Therapy/Group: Individual Therapy   Excell Seltzer, PT, DPT  03/31/2020, 2:51 PM

## 2020-03-31 NOTE — Progress Notes (Signed)
[place] PHYSICAL MEDICINE & REHABILITATION PROGRESS NOTE  Subjective/Complaints: Patient seen laying in bed this morning.  He states he did not sleep well overnight due to frequent stools.  ROS: + Frequent stools. Denies CP, SOB, N/V/D  Objective: Vital Signs: Blood pressure (!) 119/54, pulse (!) 55, temperature 98.4 F (36.9 C), resp. rate 17, height 6' (1.829 m), weight 108.4 kg, SpO2 96 %. No results found. Recent Labs    03/30/20 0819  WBC 3.3*  HGB 11.7*  HCT 36.9*  PLT 263   Recent Labs    03/30/20 0819  NA 136  K 3.8  CL 104  CO2 25  GLUCOSE 179*  BUN 15  CREATININE 0.66  CALCIUM 8.8*    Physical Exam: BP (!) 119/54 (BP Location: Right Arm)   Pulse (!) 55   Temp 98.4 F (36.9 C)   Resp 17   Ht 6' (1.829 m)   Wt 108.4 kg   SpO2 96%   BMI 32.41 kg/m  Constitutional: No distress . Vital signs reviewed. HENT: Normocephalic.  Atraumatic. Eyes: EOMI. No discharge. Cardiovascular: No JVD. Respiratory: Normal effort.  No stridor. GI: Non-distended. Skin: Warm and dry.  Intact. Psych: Normal mood.  Normal behavior. Musc: No edema in extremities.  No tenderness in extremities. Neuro: Alert Aware of deficits Motor:  RUE: Shoulder abduction, elbow flexion 5/5, wrist extension 4+/5, elbow ext 4+/5, hand grip 1+/5, stable LUE: Shoulder abduction, elbow flexion 5/5, wrist extension 4+/5, elbow ext 4/5, hand grip 1/5, stable B/l LE: 0/5 proxima to distal, stable  Assessment/Plan: 1. Functional deficits secondary to NMO/transverse myelitis which require 3+ hours per day of interdisciplinary therapy in a comprehensive inpatient rehab setting.  Physiatrist is providing close team supervision and 24 hour management of active medical problems listed below.  Physiatrist and rehab team continue to assess barriers to discharge/monitor patient progress toward functional and medical goals  Care Tool:  Bathing    Body parts bathed by patient: Chest, Abdomen,  Right arm, Left arm, Face   Body parts bathed by helper: Front perineal area, Buttocks, Right upper leg, Left upper leg, Right lower leg, Left lower leg     Bathing assist Assist Level: Moderate Assistance - Patient 50 - 74%     Upper Body Dressing/Undressing Upper body dressing Upper body dressing/undressing activity did not occur (including orthotics): N/A What is the patient wearing?: Pull over shirt    Upper body assist Assist Level: Maximal Assistance - Patient 25 - 49%    Lower Body Dressing/Undressing Lower body dressing      What is the patient wearing?: Pants, Incontinence brief     Lower body assist Assist for lower body dressing: Dependent - Patient 0%     Toileting Toileting    Toileting assist Assist for toileting: Dependent - Patient 0%     Transfers Chair/bed transfer  Transfers assist     Chair/bed transfer assist level: Dependent - Patient 0%     Locomotion Ambulation   Ambulation assist   Ambulation activity did not occur: Safety/medical concerns (quadriplegia, decreased balance/postural control)          Walk 10 feet activity   Assist  Walk 10 feet activity did not occur: Safety/medical concerns (quadriplegia, decreased balance/postural control)        Walk 50 feet activity   Assist Walk 50 feet with 2 turns activity did not occur: Safety/medical concerns (quadriplegia, decreased balance/postural control)         Walk 150 feet activity  Assist Walk 150 feet activity did not occur: Safety/medical concerns (quadriplegia, decreased balance/postural control)         Walk 10 feet on uneven surface  activity   Assist Walk 10 feet on uneven surfaces activity did not occur: Safety/medical concerns (quadriplegia, decreased balance/postural control)         Wheelchair     Assist Will patient use wheelchair at discharge?: Yes Type of Wheelchair: Manual    Wheelchair assist level: Dependent - Patient 0% Max  wheelchair distance: 132ft    Wheelchair 50 feet with 2 turns activity    Assist        Assist Level: Dependent - Patient 0%   Wheelchair 150 feet activity     Assist     Assist Level: Dependent - Patient 0%      Medical Problem List and Plan: 1.  Quadriparesis secondary to transverse myelitis/NMO.  IV IG completed 03/20/2020 as well as received Rituxan dose 03/24/2020, plan for repeat dose ~7/20.  Continue CIR  B/l PRAFO nightly 2.  Antithrombotics: -DVT/anticoagulation: Lovenox                Vascular study negative for DVT             -antiplatelet therapy: Aspirin 81 mg daily 3. Pain Management: Lidoderm patch, tramadol as needed  Controlled on 7/13  Monitor with increased exertion 4. Mood: Zoloft 150 mg nightly, Ativan as needed             -antipsychotic agents: N/A 5. Neuropsych: This patient is capable of making decisions on his own behalf. 6. Skin/Wound Care: Routine skin checks 7. Fluids/Electrolytes/Nutrition: Routine in and outs. CMP ordered 8.  Diabetes mellitus type 2 with hyperglycemia.  Hemoglobin A1c 6.7.    On Jardiance 12.5 mg daily, Metformin 1000 mg twice daily, Lantus 30 units, NovoLog 25-30 units 3 times daily PTA  Lantus insulin 30 units daily  Metformin 500 twice daily started on 7/13  Home medications discussed with pharmacy  Monitor the increased mobility  9. HCAP/OSA.  Completed course of Maxipime initiated 03/18/2020 x14 doses.  Continue BiPAP 10. Hyperlipidemia. Lipitor/Vascepa 11. Hypertension.  Lisinopril 20 mg daily  Labile on 7/12, monitor for trend  Monitor with increased mobility 12.  Transaminitis  LFTs elevated on 7/8  Continue to monitor 13.  Leukopenia-likely medication induced  WBCs 3.3 on 7/12  Continue to monitor 14.  Acute blood loss anemia  Hemoglobin 11.7 on 7/12  Continue to monitor 10.  Neurogenic bowel/bladder  Indwelling Foley due to traumatic I/O caths  Bowel program adjusted on 7/11, decreased on  7/12  LOS: 6 days A FACE TO FACE EVALUATION WAS PERFORMED  Brent [name] Brent [name] 03/31/2020, 8:06 AM

## 2020-03-31 NOTE — Plan of Care (Signed)
  Problem: SCI BOWEL ELIMINATION Goal: RH STG SCI MANAGE BOWEL PROGRAM W/ASSIST OR AS APPROPRIATE Description: STG SCI Manage bowel program w/assist or as appropriate. Max assist Outcome: Progressing   Problem: SCI BLADDER ELIMINATION Goal: RH STG SCI MANAGE BLADDER PROGRAM W/ASSISTANCE Description: Manage bladder with max assist Outcome: Progressing   Problem: RH SKIN INTEGRITY Goal: RH STG SKIN FREE OF INFECTION/BREAKDOWN Description: Prevent skin breakdown with max assist Outcome: Progressing Goal: RH STG ABLE TO PERFORM INCISION/WOUND CARE W/ASSISTANCE Description: STG Able To Perform Incision/Wound Care With Assistance. Manage peri area redness with mod assist Outcome: Progressing   Problem: RH PAIN MANAGEMENT Goal: RH STG PAIN MANAGED AT OR BELOW PT'S PAIN GOAL Description: Less than 4 Outcome: Progressing   Problem: Consults Goal: RH SPINAL CORD INJURY PATIENT EDUCATION Description:  See Patient Education module for education specifics.  Outcome: Progressing

## 2020-04-01 ENCOUNTER — Inpatient Hospital Stay (HOSPITAL_COMMUNITY): Payer: Medicare Other | Admitting: Occupational Therapy

## 2020-04-01 ENCOUNTER — Encounter (HOSPITAL_COMMUNITY): Payer: Medicare Other | Admitting: Psychology

## 2020-04-01 ENCOUNTER — Inpatient Hospital Stay (HOSPITAL_COMMUNITY): Payer: Medicare Other | Admitting: Physical Therapy

## 2020-04-01 LAB — GLUCOSE, CAPILLARY
Glucose-Capillary: 139 mg/dL — ABNORMAL HIGH (ref 70–99)
Glucose-Capillary: 149 mg/dL — ABNORMAL HIGH (ref 70–99)
Glucose-Capillary: 153 mg/dL — ABNORMAL HIGH (ref 70–99)
Glucose-Capillary: 129 mg/dL — ABNORMAL HIGH (ref 70–99)

## 2020-04-01 NOTE — Patient Care Conference (Signed)
Inpatient RehabilitationTeam Conference and Plan of Care Update Date: 04/01/2020   Time: 3:09 PM    Patient Name: Brent [name].      Medical Record Number: [id number]  Date of Birth: [birth year] Sex: Male         Room/Bed: 4M08C/4M08C-01 Payor Info: Payor: MEDICARE / Plan: MEDICARE PART A AND B / Product Type: *No Product type* /    Admit Date/Time:  03/25/2020  2:57 PM  Primary Diagnosis:  Quadriplegia and quadriparesis [facility])  Hospital Problems: Principal Problem:   Quadriplegia and quadriparesis ([place]) Active Problems:   Acute blood loss anemia   Leukopenia   Transaminitis   Controlled type 2 diabetes mellitus with hyperglycemia, with long-term current use of insulin (HCC)   Neurogenic bladder   Neurogenic bowel   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 Delice Lesch Care Coodinator Present: Dorien Chihuahua, RN, BSN, CRRN;Christina Blodgett, [place] Nurse Present: Serena Croissant, LPN PT Present: Excell Seltzer, PT OT Present: Simonne Come, OT PPS Coordinator present : Gunnar Fusi, SLP     Current Status/Progress Goal Weekly Team Focus  Bowel/Bladder   Foley catth for retention. Continent of bowel. LBM 7/13. on bowel program. 7/13 had multiple loose stoool suppository on hold x1dose.  Remain continent, establish bowel regular bowel movements  assess toileting needs qshift and PRN   Swallow/Nutrition/ Hydration             ADL's   max- total +2 Slideboard transfers, mod-maxA sitting balance, max-total A bed level LB b/d  modA dyn sit, toileting, LB dress, minA groom, bathe, set up/I eating/UB dress, func use of BUE  ADL retraining, balance, conditioning, trunk control   Mobility   max A rolling, max A +2 supine to/from sit, max to total A +2 SB transfers  mod A at w/c level  bed mobility,  transfers, sitting balance   Communication             Safety/Cognition/ Behavioral Observations            Pain   c/o back pain relieved with PRN tramadol  pain less than 3  assess pain qshift and PRN   Skin   MASD to bottom and groin foam dressing and barrier cream applied daily and PRN  promote wound healing  assess skin qshift and PRN.     Team Discussion:  Discharge Planning/Teaching Needs:  Goal to discharge home with spouse and grandson.  Will schedule with spouse if reccommended   Current Update: on target  Current Barriers to Discharge:  Decreased caregiver support, Home enviroment access/layout and Neurogenic bowel and bladder  Possible Resolutions to Barriers: Hands on education for wife and recommendation of addition of caregivers ,  Install ramp for entry to home, and adaptive equipment recommendations to decrease assistance required Foley placed for urinary incontinence and need for q 4 hour intermittent catheterizations  Patient on target to meet rehab goals: yes, progress is slow.  *See Care Plan and progress notes for long and short-term goals.   Incontinent of bowel - MD adjusting medications. Evening bowel program continued  Skin issues addressed  Revisions to Treatment Plan:      Medical Summary Current Status: Quadriparesis secondary to transverse myelitis/NMO.  IV IG completed 03/20/2020 as well as received Rituxan dose 03/24/2020, plan for repeat dose ~7/20. Weekly  Focus/Goal: Improve mobility, neurogenic bowel/bladder, WBCs, BP, CBGs  Barriers to Discharge: Medical stability   Possible Resolutions to Barriers: Therapies, follow labs, optimize DM/BP meds, bowel/bladder program   Continued Need for Acute Rehabilitation Level of Care: The patient requires daily medical management by a physician with specialized training in physical medicine and rehabilitation for the following reasons: Direction of a multidisciplinary physical rehabilitation program  to maximize functional independence : Yes Medical management of patient stability for increased activity during participation in an intensive rehabilitation regime.: Yes Analysis of laboratory values and/or radiology reports with any subsequent need for medication adjustment and/or medical intervention. : Yes   I attest that I was present, lead the team conference, and concur with the assessment and plan of the team.   Dorien Chihuahua B 04/01/2020, 3:09 PM

## 2020-04-01 NOTE — Progress Notes (Signed)
Occupational Therapy Session Note  Patient Details  Name: Brent [name]. MRN: [id number] Date of Birth: Jan 30, 1946  Today's Date: 04/01/2020 OT Individual Time: 1003-1048 OT Individual Time Calculation (min): 45 min    Short Term Goals: Week 1:  OT Short Term Goal 1 (Week 1): patient will increase bilateral hand ROM/dexterity and strength to perform eating and basic grooming tasks with min A OT Short Term Goal 2 (Week 1): patient will increase UB adl to mod A bed level - progress to seated in w/c as able OT Short Term Goal 3 (Week 1): patient will roll in bed with mod A and tolerate sitting in TIS w/c for 1 hour OT Short Term Goal 4 (Week 1): patient will direct lift transfer with mod cues OT Short Term Goal 5 (Week 1): patient will monitor and direct weight shifts with min cues  Skilled Therapeutic Interventions/Progress Updates:    Treatment session with focus on functional use of BUE.  Pt received upright in w/c reporting already dressed during PT session and ready to continue with therapy sessions. Engaged in [place] with focus on gross sustained grasp and endurance in [place].  Pt tolerated 90 seconds forwards and unable to maintain loose grasp to complete backwards for > 15 seconds.  Pt reports fatigue in BUE/shoulders with pedaling motion.  Engaged in table top task in supported sitting from TIS w/c.  Pt demonstrating increased ability to grasp loosely large foam blocks to stack and unstack up to 4 blocks with both RUE and LUE.  Pt continues to demonstrate increased grasp and mobility with RUE compared to Lt.  Placed pegboard on vertical surface to increase challenge of wrist extension. Pt able to remove pegs from pegboard with BUE, noting increased wrist extension greater on Rt than Lt.  Pt able to place pegs in to peg board 6/6 pegs on horizontal surface but unable when on vertical surface.  Pt demonstrating increased use of BUE in supported sitting this session.  Recommend pt be up in w/c  for meals to increase success with BUE. Pt remained tilted back in TIS w/c with seat belt alarm on, gait belt around thighs for improved positioning, and all needs in reach.  Therapy Documentation Precautions:  Precautions Precautions: Fall Precaution Comments: quadriplegia Restrictions Weight Bearing Restrictions: No Pain: Pain Assessment Pain Scale: 0-10 Pain Score: 0-No pain   Therapy/Group: Individual Therapy  Simonne Come 04/01/2020, 12:05 PM

## 2020-04-01 NOTE — Progress Notes (Signed)
Physical Therapy Session Note  Patient Details  Name: Brent [name]. MRN: [id number] Date of Birth: 07-22-1946  Today's Date: 04/01/2020 PT Individual Time: 0800-0900 PT Individual Time Calculation (min): 60 min   Short Term Goals: Week 1:  PT Short Term Goal 1 (Week 1): Pt will roll L and R with mod A and use of bed features PT Short Term Goal 2 (Week 1): Pt will transfer supine<>sitting EOB with max A of 1 PT Short Term Goal 3 (Week 1): Pt will transfer bed<>WC with LRAD max A of 1  Skilled Therapeutic Interventions/Progress Updates:  Pt received semi-reclined in bed in room. Agreeable to therapy. Denies any pain. PRAFOs removed by therapist. Thigh high TED hose and shoes donned dependently at bed level. Pt performed rolling R/L in bed with max A throughout session. Skilled cueing/assist required for UE and LE placement. Supine to sit at EOB with A x 2 for trunk control and LE management. Unable to completely flatten bed d/t mechanical issues so slide board transfer deferred at this time for safety. Sit to supine in bed with A x 2 for trunk control and LE management. Dependent transfer from bed to [place]. Dependent TIS W/C transfer to/from therapy gym for time conservation. Slide board transfer TIS W/C <> mat table with total A x 2. Pt requires mod to max A to maintain sitting balance on mat table d/t poor trunk control, and exhibits R lateral trunk lean with verbal cues required for upright posture. Remainder of session focused on sitting balance at EOM and trunk control. Performed R/L lateral leans 1 x 5 each side with mod-max A required when leaning to the R and min-mod A required when leaning to the L to return to sitting. Pt fatigues quickly with this task and recovers with seated and reclined rest breaks. From "tripod" position propping himself up on BUE, pt is able to walk his hands forward on mat table and place in his lap x3 reps with posterior trunk support, mod A,  and rest breaks as needed. Verbal cues required for upright posture. Pt left seated in TIS W/C in room with all needs in reach. Safety belt in place. Gait belt around thighs for reduced hip abduction in sitting. All questions answered.  Therapy Documentation Precautions:  Precautions Precautions: Fall Precaution Comments: quadriplegia Restrictions Weight Bearing Restrictions: No  Therapy/Group: Individual Therapy  Nadeen Landau, SPT 04/01/2020, 12:45 PM

## 2020-04-01 NOTE — Progress Notes (Signed)
Team Conference Report to Patient/Family  Team Conference discussion was reviewed with the patient and caregiver, including goals, any changes in plan of care and target discharge date.  Patient and caregiver express understanding and are in agreement.  The patient has a target discharge date of 04/30/20.  Dyanne Iha 04/01/2020, 1:56 PM

## 2020-04-01 NOTE — Progress Notes (Signed)
Occupational Therapy Session Note  Patient Details  Name: Brent [name]. MRN: [id number] Date of Birth: 10/28/1945  Today's Date: 04/01/2020 OT Individual Time: 1304-1410 OT Individual Time Calculation (min): 66 min    Short Term Goals: Week 1:  OT Short Term Goal 1 (Week 1): patient will increase bilateral hand ROM/dexterity and strength to perform eating and basic grooming tasks with min A OT Short Term Goal 2 (Week 1): patient will increase UB adl to mod A bed level - progress to seated in w/c as able OT Short Term Goal 3 (Week 1): patient will roll in bed with mod A and tolerate sitting in TIS w/c for 1 hour OT Short Term Goal 4 (Week 1): patient will direct lift transfer with mod cues OT Short Term Goal 5 (Week 1): patient will monitor and direct weight shifts with min cues  Skilled Therapeutic Interventions/Progress Updates:    Treatment session with focus on functional use of BUE to engage in self-feeding, sitting balance, and transfers.  Pt received upright in TIS w/c reporting lunch tray having just arrived.  Therapist assisted pt with setup of built up u-cuff and opening containers.  Pt eating soup for lunch, discussed alternative bowls to increase success with scooping soup/cereal.  Completed slide board transfer w/c <> therapy mat total assist +2 with facilitation for anterior weight shift and clearance of buttocks during transfer.  Engaged in sitting on edge of mat with therapy ball at back to facilitate increased trunk support and sitting balance.  Pt reports dizziness and nausea, BP 115/43.  RN notified and provided water and assessed pt. Returned to room and completed transfer back to bed via [facility] as pt with decreased endurance and fatigue.  Remained semi-reclined in bed with all needs in reach.  Therapy Documentation Precautions:  Precautions Precautions: Fall Precaution Comments: quadriplegia Restrictions Weight Bearing Restrictions: No Pain: Pain  Assessment Pain Scale: 0-10 Pain Score: 0-No pain  Therapy/Group: Individual Therapy  Simonne Come 04/01/2020, 2:18 PM

## 2020-04-01 NOTE — Plan of Care (Signed)
  Problem: SCI BOWEL ELIMINATION Goal: RH STG SCI MANAGE BOWEL PROGRAM W/ASSIST OR AS APPROPRIATE Description: STG SCI Manage bowel program w/assist or as appropriate. Max assist Outcome: Progressing   Problem: SCI BLADDER ELIMINATION Goal: RH STG SCI MANAGE BLADDER PROGRAM W/ASSISTANCE Description: Manage bladder with max assist Outcome: Progressing   Problem: RH SKIN INTEGRITY Goal: RH STG SKIN FREE OF INFECTION/BREAKDOWN Description: Prevent skin breakdown with max assist Outcome: Progressing Goal: RH STG ABLE TO PERFORM INCISION/WOUND CARE W/ASSISTANCE Description: STG Able To Perform Incision/Wound Care With Assistance. Manage peri area redness with mod assist Outcome: Progressing   Problem: RH PAIN MANAGEMENT Goal: RH STG PAIN MANAGED AT OR BELOW PT'S PAIN GOAL Description: Less than 4 Outcome: Progressing   Problem: Consults Goal: RH SPINAL CORD INJURY PATIENT EDUCATION Description:  See Patient Education module for education specifics.  Outcome: Progressing

## 2020-04-01 NOTE — Progress Notes (Signed)
[place] PHYSICAL MEDICINE & REHABILITATION PROGRESS NOTE  Subjective/Complaints: Patient seen sitting up in bed this AM. He states he slept well overnight. He notes improvement in bowel movements.  ROS: Denies CP, SOB, N/V/D  Objective: Vital Signs: Blood pressure (!) 114/46, pulse (!) 54, temperature 98.1 F (36.7 C), resp. rate 18, height 6' (1.829 m), weight 108.4 kg, SpO2 96 %. No results found. Recent Labs    03/30/20 0819  WBC 3.3*  HGB 11.7*  HCT 36.9*  PLT 263   Recent Labs    03/30/20 0819  NA 136  K 3.8  CL 104  CO2 25  GLUCOSE 179*  BUN 15  CREATININE 0.66  CALCIUM 8.8*    Physical Exam: BP (!) 114/46   Pulse (!) 54   Temp 98.1 F (36.7 C)   Resp 18   Ht 6' (1.829 m)   Wt 108.4 kg   SpO2 96%   BMI 32.41 kg/m  Constitutional: No distress . Vital signs reviewed. HENT: Normocephalic.  Atraumatic. Eyes: EOMI. No discharge. Cardiovascular: No JVD.  RRR. Respiratory: Normal effort.  No stridor.  Bilaterally clear to auscultation. GI: Non-distended.  BS +. Skin: Warm and dry.  Intact. Psych: Normal mood.  Normal behavior. Musc: No edema in extremities.  No tenderness in extremities. Neuro: Alert Aware of deficits Motor:  RUE: Shoulder abduction, elbow flexion 5/5, wrist extension 4+/5, elbow ext 4+/5, limited finger flexion at MCP joints, distally 0/5 LUE: Shoulder abduction, elbow flexion 5/5, wrist extension 4+/5, elbow ext 4/5, hand grip 0/5 B/l LE: 0/5 proxima to distal, stable  Assessment/Plan: 1. Functional deficits secondary to NMO/transverse myelitis which require 3+ hours per day of interdisciplinary therapy in a comprehensive inpatient rehab setting.  Physiatrist is providing close team supervision and 24 hour management of active medical problems listed below.  Physiatrist and rehab team continue to assess barriers to discharge/monitor patient progress toward functional and medical goals  Care Tool:  Bathing    Body parts bathed  by patient: Chest, Abdomen, Right arm, Left arm, Face   Body parts bathed by helper: Front perineal area, Buttocks, Right upper leg, Left upper leg, Right lower leg, Left lower leg     Bathing assist Assist Level: Moderate Assistance - Patient 50 - 74%     Upper Body Dressing/Undressing Upper body dressing Upper body dressing/undressing activity did not occur (including orthotics): N/A What is the patient wearing?: Pull over shirt    Upper body assist Assist Level: Maximal Assistance - Patient 25 - 49%    Lower Body Dressing/Undressing Lower body dressing      What is the patient wearing?: Pants, Incontinence brief     Lower body assist Assist for lower body dressing: Dependent - Patient 0%     Toileting Toileting    Toileting assist Assist for toileting: Dependent - Patient 0%     Transfers Chair/bed transfer  Transfers assist     Chair/bed transfer assist level: 2 Helpers     Locomotion Ambulation   Ambulation assist   Ambulation activity did not occur: Safety/medical concerns (quadriplegia, decreased balance/postural control)          Walk 10 feet activity   Assist  Walk 10 feet activity did not occur: Safety/medical concerns (quadriplegia, decreased balance/postural control)        Walk 50 feet activity   Assist Walk 50 feet with 2 turns activity did not occur: Safety/medical concerns (quadriplegia, decreased balance/postural control)  Walk 150 feet activity   Assist Walk 150 feet activity did not occur: Safety/medical concerns (quadriplegia, decreased balance/postural control)         Walk 10 feet on uneven surface  activity   Assist Walk 10 feet on uneven surfaces activity did not occur: Safety/medical concerns (quadriplegia, decreased balance/postural control)         Wheelchair     Assist Will patient use wheelchair at discharge?: Yes Type of Wheelchair: Manual    Wheelchair assist level: Dependent -  Patient 0% Max wheelchair distance: 176ft    Wheelchair 50 feet with 2 turns activity    Assist        Assist Level: Dependent - Patient 0%   Wheelchair 150 feet activity     Assist     Assist Level: Dependent - Patient 0%      Medical Problem List and Plan: 1.  Quadriparesis secondary to transverse myelitis/NMO.  IV IG completed 03/20/2020 as well as received Rituxan dose 03/24/2020, plan for repeat dose ~7/20.  Continue CIR  Team conference today to discuss current and goals and coordination of care, home and environmental barriers, and discharge planning with nursing, case manager, and therapies.   B/l PRAFO nightly 2.  Antithrombotics: -DVT/anticoagulation: Lovenox                Vascular study negative for DVT             -antiplatelet therapy: Aspirin 81 mg daily 3. Pain Management: Lidoderm patch, tramadol as needed  Controlled on 7/14  Monitor with increased exertion 4. Mood: Zoloft 150 mg nightly, Ativan as needed             -antipsychotic agents: N/A 5. Neuropsych: This patient is capable of making decisions on his own behalf. 6. Skin/Wound Care: Routine skin checks 7. Fluids/Electrolytes/Nutrition: Routine in and outs. CMP ordered 8.  Diabetes mellitus type 2 with hyperglycemia.  Hemoglobin A1c 6.7.    On Jardiance 12.5 mg daily, Metformin 1000 mg twice daily, Lantus 30 units, NovoLog 25-30 units 3 times daily PTA  Lantus insulin 30 units daily  Metformin 500 twice daily started on 7/13  Remains slightly elevated on 7/14  Home medications discussed with pharmacy  Monitor the increased mobility  9. HCAP/OSA.  Completed course of Maxipime initiated 03/18/2020 x14 doses.  Continue BiPAP 10. Hyperlipidemia. Lipitor/Vascepa 11. Hypertension.  Lisinopril 20 mg daily  Slightly labile on 7/14  Monitor with increased mobility 12.  Transaminitis  LFTs elevated on 7/8  Continue to monitor 13.  Leukopenia-likely medication induced  WBCs 3.3 on 7/12  Continue  to monitor 14.  Acute blood loss anemia  Hemoglobin 11.7 on 7/12  Continue to monitor 10.  Neurogenic bowel/bladder  Indwelling Foley due to traumatic I/O caths  Bowel program adjusted on 7/11, decreased on 7/12   Improving  LOS: 7 days A FACE TO FACE EVALUATION WAS PERFORMED  Anetta Olvera Lorie Phenix 04/01/2020, 7:58 AM

## 2020-04-02 ENCOUNTER — Inpatient Hospital Stay (HOSPITAL_COMMUNITY): Payer: Medicare Other | Admitting: *Deleted

## 2020-04-02 ENCOUNTER — Inpatient Hospital Stay (HOSPITAL_COMMUNITY): Payer: Medicare Other | Admitting: Physical Therapy

## 2020-04-02 ENCOUNTER — Inpatient Hospital Stay (HOSPITAL_COMMUNITY): Payer: Medicare Other | Admitting: Occupational Therapy

## 2020-04-02 ENCOUNTER — Inpatient Hospital Stay (HOSPITAL_COMMUNITY): Payer: Medicare Other

## 2020-04-02 LAB — GLUCOSE, CAPILLARY
Glucose-Capillary: 143 mg/dL — ABNORMAL HIGH (ref 70–99)
Glucose-Capillary: 147 mg/dL — ABNORMAL HIGH (ref 70–99)
Glucose-Capillary: 184 mg/dL — ABNORMAL HIGH (ref 70–99)
Glucose-Capillary: 178 mg/dL — ABNORMAL HIGH (ref 70–99)

## 2020-04-02 NOTE — Progress Notes (Signed)
Occupational Therapy Session Note  Patient Details  Name: Brent [name]. MRN: [id number] Date of Birth: [birth year]  Today's Date: 04/02/2020 OT Individual Time: 1445-1540 OT Individual Time Calculation (min): 55 min    Short Term Goals: Week 1:  OT Short Term Goal 1 (Week 1): patient will increase bilateral hand ROM/dexterity and strength to perform eating and basic grooming tasks with min A OT Short Term Goal 2 (Week 1): patient will increase UB adl to mod A bed level - progress to seated in w/c as able OT Short Term Goal 3 (Week 1): patient will roll in bed with mod A and tolerate sitting in TIS w/c for 1 hour OT Short Term Goal 4 (Week 1): patient will direct lift transfer with mod cues OT Short Term Goal 5 (Week 1): patient will monitor and direct weight shifts with min cues  Skilled Therapeutic Interventions/Progress Updates:    Treatment session with focus on functional use of BUE with grasp and 3 jaw chuck and sitting balance.  Pt received in TIS w/c agreeable to therapy session.  Engaged in functional grasp activity with reaching with gross grasp and attempting 3 jaw chuck to increase finger function.  Pt unable to pick up any items despite modifying size and setup with 3 jaw chuck, pt reports some success with pinch during previous OT session.  Gross grasp decreased from grasp during yesterday's session with this therapist, question fatigue related.  Engaged in bicep curls and chest presses with unweighted dowel to address functional grasp.  Required placement of dycem on dowel to maintain grasp.  Completed slide board transfer total assist +2 to EOB.  Pt demonstrating increased anterior weight shift and sequencing of transfer, however still requiring total +2 for clearance of buttocks during transfer.  Engaged in reaching in unsupported sitting with pt requiring CGA to Mod assist while alternating UE support.  Pt returned to sidelying and encouraged to maintain in semi-sidelying on  pillows for pressure relief on buttocks.  Left with phone and call bell in reach.  Therapy Documentation Precautions:  Precautions Precautions: Fall Precaution Comments: quadriplegia Restrictions Weight Bearing Restrictions: No General:   Vital Signs: Therapy Vitals Temp: 98.6 F (37 C) Pulse Rate: (!) 52 Resp: 19 BP: (!) 120/50 Patient Position (if appropriate): Sitting Oxygen Therapy SpO2: 99 % O2 Device: Room Air Pain:  Pt with c/o pain in Lt neck/trap area. Repositioned.   Therapy/Group: Individual Therapy  Simonne Come 04/02/2020, 4:54 PM

## 2020-04-02 NOTE — Progress Notes (Signed)
Occupational Therapy Session Note  Patient Details  Name: Brent [name]. MRN: [id number] Date of Birth: 1946-08-12  Today's Date: 04/02/2020 OT Individual Time: 0762-2633 OT Individual Time Calculation (min): 49 min    Short Term Goals: Week 1:  OT Short Term Goal 1 (Week 1): patient will increase bilateral hand ROM/dexterity and strength to perform eating and basic grooming tasks with min A OT Short Term Goal 2 (Week 1): patient will increase UB adl to mod A bed level - progress to seated in w/c as able OT Short Term Goal 3 (Week 1): patient will roll in bed with mod A and tolerate sitting in TIS w/c for 1 hour OT Short Term Goal 4 (Week 1): patient will direct lift transfer with mod cues OT Short Term Goal 5 (Week 1): patient will monitor and direct weight shifts with min cues  Skilled Therapeutic Interventions/Progress Updates:    patient seated in w/c, he denies pain and eager to participate in therapy session.  Reviewed and set up for transfer to tilt table - while in maxi move to transition to tilt table patient able to feel BM.  Returned to w/c and then to bed via maxi move.  Mod/max A for rolling side to side for dependent change of soiled brief.  Completed side lying to sitting edge of bed with max A of one.  Completed trunk posture, balance, weight shift activities in unsupported sitting with min A to maintain balance using both UEs for support, mod A when attempting to use one UE at a time.  Returned to w/c via maxi move where he remained at close of session for upright positioning for lunch, seat belt alarm set and call bell in hand.    Therapy Documentation Precautions:  Precautions Precautions: Fall Precaution Comments: quadriplegia Restrictions Weight Bearing Restrictions: No   Therapy/Group: Individual Therapy  Carlos Levering 04/02/2020, 7:38 AM

## 2020-04-02 NOTE — Progress Notes (Signed)
Pt had 4 medium sized bowel movements this morning , liquid in consistency. Bowel program was not completed this evening due to increased bowel movements. Will continue  to monitor.

## 2020-04-02 NOTE — Care Management (Signed)
Patient ID: Brent [name]., male   DOB: 1945-09-21, 74 y.o.   MRN: [id number]  Met with patient and nurse Loree Fee) to review skin and assess heels. Clarification of use of PRAFO boots and Prevalon boots. Callous noted on bil heels however no redness, pressure areas or irritation noted. Review risk for pressure areas and need to elevate heels off bed when in bed and pressure relief off sacrum when in the chair and turn during the night. Patient reported he had slept better than the night before. Expressed concern that his wife was overwhelmed by the amount of care he required. Reported limited resources to help her at discharge manage the patient's care. Reassured patient that family education would be completed and practice sessions before discharge to assure she was comfortable providing the care. [name]

## 2020-04-02 NOTE — Progress Notes (Signed)
Physical Therapy Weekly Progress Note  Patient Details  Name: Brent [name]. MRN: [id number] Date of Birth: 09-Jun-1946  Beginning of progress report period: March 26, 2020 End of progress report period: April 02, 2020  Today's Date: 04/02/2020 PT Individual Time: 0900-1000 PT Individual Time Calculation (min): 60 min   Patient has met 0 of 3 short term goals. Pt is making very slow progress towards rehab goals. Pt is currently mod to max A for rolling in bed with use of hospital bed features, is max to total A x 1-2 for supine to/from sit transfers, can require up to mod A for static sitting balance due to poor core control, and requires total A +2 for slide board transfers due to inability to functionally assist much with transfer. Pt remains motivated and exhibits great participation in therapy sessions as he is able. Pt has been limited at times due to fluctuations in blood pressure and decreased overall endurance.  Patient continues to demonstrate the following deficits muscle weakness, muscle joint tightness and muscle paralysis, decreased cardiorespiratoy endurance, abnormal tone, unbalanced muscle activation and decreased coordination and decreased sitting balance, decreased postural control and decreased balance strategies and therefore will continue to benefit from skilled PT intervention to increase functional independence with mobility.  Patient progressing toward long term goals..  Continue plan of care.  PT Short Term Goals Week 1:  PT Short Term Goal 1 (Week 1): Pt will roll L and R with mod A and use of bed features PT Short Term Goal 1 - Progress (Week 1): Progressing toward goal PT Short Term Goal 2 (Week 1): Pt will transfer supine<>sitting EOB with max A of 1 PT Short Term Goal 2 - Progress (Week 1): Progressing toward goal PT Short Term Goal 3 (Week 1): Pt will transfer bed<>WC with LRAD max A of 1 PT Short Term Goal 3 - Progress (Week 1): Progressing toward goal Week  2:  PT Short Term Goal 1 (Week 2): Pt will perform rolling L/R with mod A x 1 PT Short Term Goal 2 (Week 2): Pt will initiate power wheelchair mobility PT Short Term Goal 3 (Week 2): Pt will maintain static sitting balance x 5 min with CGA  Skilled Therapeutic Interventions/Progress Updates:    Pt received seated in bed, agreeable to PT session. No complaints of pain at rest. Pt is dependent to don TED hose and pants at bed level with mod to max A for rolling R/L with use of hospital bed features. Supine to sit with total A for BLE management and trunk control. Slide board transfer to w/c with total A x 2 throughout session. Assisted pt with changing his shirt while seated in w/c, pt requires assist to lean trunk anteriorly and to pull shirt over his head, able to thread BUE with min A. Dependent transport via TIS to/from therapy gym. Session focus on sitting balance and core control EOM. Pt is able to perform bicep curls 2 x 10 reps with 1# dowel rod, first set with posterior trunk supported by therapy ball and second set unsupported with mod A needed to maintain upright posture. Seated 1# dowel rod rows x 10 reps with mod A for trunk support. Pt tends to lose balance anteriorly with onset of fatigue. Pt left semi-reclined in TIS w/c in room with needs in reach, quick release belt and chair alarm in place at end of session. Pt also reports onset of L upper trap pain during session, applied short-acting hot pack  to region at end of session for pain management.  Therapy Documentation Precautions:  Precautions Precautions: Fall Precaution Comments: quadriplegia Restrictions Weight Bearing Restrictions: No   Therapy/Group: Individual Therapy   Excell Seltzer, PT, DPT  04/02/2020, 12:25 PM

## 2020-04-02 NOTE — Evaluation (Signed)
Recreational Therapy Assessment and Plan  Patient Details  Name: Brent [name]. MRN: [id number] Date of Birth: [birth year] Today's Date: 04/02/2020  Rehab Potential:  Good ELOS:   d/c 8/12  Assessment  Hospital Problem: Principal Problem:   Quadriplegia and quadriparesis [facility])   Past Medical History:      Past Medical History:  Diagnosis Date  . Allergy   . CAD (coronary artery disease)    a. angioplasty of his RCA in 1990. b. bare metal stent placed in the RCA in 2000 followed by rotational atherectomy shortly after for stent restenosis. c. last cath was in 2012 showing stable moderate diffuse CAD. (70% mid LAD, 80% diagonal, 70% Ramus, 40% mid to distal RCA stent restenosis). d. Low risk nuc in 2015.  . Diabetes mellitus   . Diverticulosis   . Elevated CK   . Erectile dysfunction   . Hemorrhoids   . HTN (hypertension)   . Hyperlipidemia   . Hypertriglyceridemia   . Malignant melanoma of left side of neck ([place]) 10/25/2018  . Myocardial infarction ([place])   . Obesity   . OSA (obstructive sleep apnea)   . Persistent disorder of initiating or maintaining sleep   . Personal history of colonic polyps 02/05/2003   Past Surgical History:       Past Surgical History:  Procedure Laterality Date  . CHOLECYSTECTOMY    . CORONARY ANGIOPLASTY    . CORONARY STENT PLACEMENT     stenting of the right coronary artery with followup rotational  atherectomy. (3 stents placed)  . FINGER SURGERY     right  . FOOT SURGERY     right  . INGUINAL HERNIA REPAIR     right  . melanoma removal     neck  . ORTHOPEDIC SURGERY     foot right  . rotator cuff surg     Bil    Assessment & Plan Clinical Impression: Patient is a 74 y.o. year old male with history of remote MI stenting x3 in the 1990s maintained on aspirin, diabetes mellitus with neuropathy, hyperlipidemia, obesity with BMI 29.42. Per chart review lives with spouse and 108 year old  grandson. History taken from chart review and patient. Reportedly independent prior to admission. Two-level home half bath on main level bedroom upstairs. He presented on 02/22/2020 with chest pain to his back associated shortness of breath and diaphoresis as well as reported some right leg weakness then quickly had bilateral lower extremity weakness as well as numbness in both hands. Troponin unremarkable and chemistries within normal limits except glucose 229. Chest x-ray no acute process. CT angiogram of chest negative for aortic aneurysm or dissection. No acute findings in the chest abdomen or pelvis. MRI thoracic spine showed a 6 x 10 mm enhancing lesion at left T9 vertebral body appeared nonspecific. No cord compression or cause for myelopathy identified. MRI cervical spine normal enhancement no mass lesion. Lumbar puncture CSF cell count 1, protein 53, IgG index normal. Cranial CT/MRI scan unremarkable for acute intracranial process. Neurology follow-up initial work-up for transverse myelitis however work-up had not been indicated of of that. He was placed on high-dose IV Solu-Medrol x5 days starting 03/10/2020. He completed course of steroids 03/14/2020. Patient suddenly had acute shortness of breath respiratory distress around 5:45 PM on the evening of 03/15/2020. Rapid response was called he was moved to the ICU. Patient initially required nonrebreather. Chest x-ray and abdominal films were done chest x-ray showed possibly infiltrates or atelectasis. MRI cervical spine T-spine repeated  by neurology which showed progressive cord edema extending from C4-5 to T4-5 with mild patchiness seen. Progression again favored myelitis as well as NMO came back positive with serum MOG antibody negative and serum paraneoplastic panel also pending. He was again started on IVIG 03/16/2020 and course has been completed. Hospital course further complicated by HCAP and he was started on Maxipime for HCAP .  During hospital course and work-up cardiology services consulted in regards to possible need for possibly atrial fibrillation being a contributor to spinal cord infarction recommendations were for 30-day cardiac event monitor which should be arranged at discharge and no further recommendations were made. Please see preadmission assessment from earlier today as well.  Pt transferred to CIR 03/25/2020.  Pt presents with decreased activity tolerance, decreased functional mobility, decreased balance Limiting pt's independence with leisure/community pursuits.  Plan Min 1 TR session >20 minutes per week during LOS  Recommendations for other services: Neuropsych  Discharge Criteria: Patient will be discharged from TR if patient refuses treatment 3 consecutive times without medical reason.  If treatment goals not met, if there is a change in medical status, if patient makes no progress towards goals or if patient is discharged from hospital.  The above assessment, treatment plan, treatment alternatives and goals were discussed and mutually agreed upon: by patient  [place] 04/02/2020, 3:48 PM

## 2020-04-02 NOTE — Progress Notes (Signed)
Occupational Therapy Session Note  Patient Details  Name: Brent [name]. MRN: [id number] Date of Birth: [birth year]  Today's Date: 04/02/2020 OT Individual Time: 1330-1425 OT Individual Time Calculation (min): 55 min    Short Term Goals: Week 1:  OT Short Term Goal 1 (Week 1): patient will increase bilateral hand ROM/dexterity and strength to perform eating and basic grooming tasks with min A OT Short Term Goal 2 (Week 1): patient will increase UB adl to mod A bed level - progress to seated in w/c as able OT Short Term Goal 3 (Week 1): patient will roll in bed with mod A and tolerate sitting in TIS w/c for 1 hour OT Short Term Goal 4 (Week 1): patient will direct lift transfer with mod cues OT Short Term Goal 5 (Week 1): patient will monitor and direct weight shifts with min cues  Skilled Therapeutic Interventions/Progress Updates:    Pt resting in TIS w/c upon arrival. OT intervention with focus on RUE/hand functional use for self care tasks. RUE NMR including NMES  1:1 NMES applied to RUE finger flexors to facilitate increased functional grasp during functional tasks Ratio 1:3 Rate 35 pps Waveform- Asymmetric Ramp 1.0 Pulse 300 Intensity- 14 Duration -  10   Report of pain at the beginning of session none Report of pain at the end of session none  No adverse reactions after treatment and is skin intact.   Minimal gains in finger flexion during and following NMES  Table activities including using pincher grasp to pick up small foam cubes and place in cup.  Pt challenged with removing cubes individually from cup with considerable difficulty. Pt also challenged with picking up large wooden pegs and placing in peg board.  Pt initially unable to pick up with pincher grasp but was more successful after coband applied to pegs. Pt returned to room and remained in TIS w/c with all needs within reach and belt alarm activated.    Therapy Documentation Precautions:   Precautions Precautions: Fall Precaution Comments: quadriplegia Restrictions Weight Bearing Restrictions: No Pain:  Pt denies pain this afternoon   Therapy/Group: Individual Therapy  Leroy Libman 04/02/2020, 3:03 PM

## 2020-04-02 NOTE — Progress Notes (Signed)
[place] PHYSICAL MEDICINE & REHABILITATION PROGRESS NOTE  Subjective/Complaints: Patient seen sitting up in bed this morning.  He states he slept well overnight.  He does note that he had a bowel movement at 3 AM.  Discussed plan to resume suppository today.  ROS: Denies CP, SOB, N/V/D  Objective: Vital Signs: Blood pressure (!) 120/39, pulse (!) 52, temperature 98 F (36.7 C), temperature source Oral, resp. rate 18, height 6' (1.829 m), weight 108.4 kg, SpO2 97 %. No results found. No results for input(s): WBC, HGB, HCT, PLT in the last 72 hours. No results for input(s): NA, K, CL, CO2, GLUCOSE, BUN, CREATININE, CALCIUM in the last 72 hours.  Physical Exam: BP (!) 120/39 (BP Location: Right Arm)   Pulse (!) 52   Temp 98 F (36.7 C) (Oral)   Resp 18   Ht 6' (1.829 m)   Wt 108.4 kg   SpO2 97%   BMI 32.41 kg/m  Constitutional: No distress . Vital signs reviewed. HENT: Normocephalic.  Atraumatic. Eyes: EOMI. No discharge. Cardiovascular: No JVD. Respiratory: Normal effort.  No stridor. GI: Non-distended. Skin: Warm and dry.  Intact. Psych: Normal mood.  Normal behavior. Musc: No edema in extremities.  No tenderness in extremities. Neuro: Alert Aware of deficits Motor:  RUE: Shoulder abduction, elbow flexion 5/5, wrist extension 4+/5, elbow ext 4+/5, distally 0/5 LUE: Shoulder abduction, elbow flexion 5/5, wrist extension 4+/5, elbow ext 4/5, hand grip 0/5 B/l LE: 0/5 proxima to distal, unchanged  Assessment/Plan: 1. Functional deficits secondary to NMO/transverse myelitis which require 3+ hours per day of interdisciplinary therapy in a comprehensive inpatient rehab setting.  Physiatrist is providing close team supervision and 24 hour management of active medical problems listed below.  Physiatrist and rehab team continue to assess barriers to discharge/monitor patient progress toward functional and medical goals  Care Tool:  Bathing    Body parts bathed by  patient: Chest, Abdomen, Right arm, Left arm, Face   Body parts bathed by helper: Front perineal area, Buttocks, Right upper leg, Left upper leg, Right lower leg, Left lower leg     Bathing assist Assist Level: Moderate Assistance - Patient 50 - 74%     Upper Body Dressing/Undressing Upper body dressing Upper body dressing/undressing activity did not occur (including orthotics): N/A What is the patient wearing?: Pull over shirt    Upper body assist Assist Level: Maximal Assistance - Patient 25 - 49%    Lower Body Dressing/Undressing Lower body dressing      What is the patient wearing?: Pants, Incontinence brief     Lower body assist Assist for lower body dressing: Dependent - Patient 0%     Toileting Toileting    Toileting assist Assist for toileting: Dependent - Patient 0%     Transfers Chair/bed transfer  Transfers assist     Chair/bed transfer assist level: 2 Helpers     Locomotion Ambulation   Ambulation assist   Ambulation activity did not occur: Safety/medical concerns (quadriplegia, decreased balance/postural control)          Walk 10 feet activity   Assist  Walk 10 feet activity did not occur: Safety/medical concerns (quadriplegia, decreased balance/postural control)        Walk 50 feet activity   Assist Walk 50 feet with 2 turns activity did not occur: Safety/medical concerns (quadriplegia, decreased balance/postural control)         Walk 150 feet activity   Assist Walk 150 feet activity did not occur: Safety/medical concerns (quadriplegia,  decreased balance/postural control)         Walk 10 feet on uneven surface  activity   Assist Walk 10 feet on uneven surfaces activity did not occur: Safety/medical concerns (quadriplegia, decreased balance/postural control)         Wheelchair     Assist Will patient use wheelchair at discharge?: Yes Type of Wheelchair: Manual    Wheelchair assist level: Dependent - Patient  0% Max wheelchair distance: 159ft    Wheelchair 50 feet with 2 turns activity    Assist        Assist Level: Dependent - Patient 0%   Wheelchair 150 feet activity     Assist     Assist Level: Dependent - Patient 0%      Medical Problem List and Plan: 1.  Quadriparesis secondary to transverse myelitis/NMO.  IV IG completed 03/20/2020 as well as received Rituxan dose 03/24/2020, plan for repeat dose ~7/20.  Continue CIR  B/l PRAFO nightly 2.  Antithrombotics: -DVT/anticoagulation: Lovenox                Vascular study negative for DVT             -antiplatelet therapy: Aspirin 81 mg daily 3. Pain Management: Lidoderm patch, tramadol as needed  Controlled on 7/15  Monitor with increased exertion 4. Mood: Zoloft 150 mg nightly, Ativan as needed             -antipsychotic agents: N/A 5. Neuropsych: This patient is capable of making decisions on his own behalf. 6. Skin/Wound Care: Routine skin checks 7. Fluids/Electrolytes/Nutrition: Routine in and outs. CMP ordered 8.  Diabetes mellitus type 2 with hyperglycemia.  Hemoglobin A1c 6.7.    On Jardiance 12.5 mg daily, Metformin 1000 mg twice daily, Lantus 30 units, NovoLog 25-30 units 3 times daily PTA  Lantus insulin 30 units daily  Metformin 500 twice daily started on 7/13  Remains slightly elevated on 7/15, will consider further medication adjustments tomorrow if persistent  Home medications discussed with pharmacy  Monitor the increased mobility  9. HCAP/OSA.  Completed course of Maxipime initiated 03/18/2020 x14 doses.  Continue BiPAP 10. Hyperlipidemia. Lipitor/Vascepa 11. Hypertension.  Lisinopril 20 mg daily  Controlled on 7/15  Monitor with increased mobility 12.  Transaminitis  LFTs elevated on 7/8, labs ordered for tomorrow  Continue to monitor 13.  Leukopenia-likely medication induced  WBCs 3.3 on 7/12, labs ordered for tomorrow  Continue to monitor 14.  Acute blood loss anemia  Hemoglobin 11.7 on 7/12,  labs ordered for tomorrow  Continue to monitor 10.  Neurogenic bowel/bladder  Indwelling Foley due to traumatic I/O caths  Bowel program adjusted on 7/11, decreased on 7/12   Improving  LOS: 8 days A FACE TO FACE EVALUATION WAS PERFORMED  Brent [name] 04/02/2020, 8:20 AM

## 2020-04-02 NOTE — Plan of Care (Signed)
°  Problem: SCI BOWEL ELIMINATION Goal: RH STG SCI MANAGE BOWEL PROGRAM W/ASSIST OR AS APPROPRIATE Description: STG SCI Manage bowel program w/assist or as appropriate. Max assist Outcome: Progressing   Problem: SCI BLADDER ELIMINATION Goal: RH STG SCI MANAGE BLADDER PROGRAM W/ASSISTANCE Description: Manage bladder with max assist Outcome: Progressing   Problem: RH SKIN INTEGRITY Goal: RH STG SKIN FREE OF INFECTION/BREAKDOWN Description: Prevent skin breakdown with max assist Outcome: Progressing Goal: RH STG ABLE TO PERFORM INCISION/WOUND CARE W/ASSISTANCE Description: STG Able To Perform Incision/Wound Care With Assistance. Manage peri area redness with mod assist Outcome: Progressing   Problem: RH PAIN MANAGEMENT Goal: RH STG PAIN MANAGED AT OR BELOW PT'S PAIN GOAL Description: Less than 4 Outcome: Progressing   Problem: Consults Goal: RH SPINAL CORD INJURY PATIENT EDUCATION Description:  See Patient Education module for education specifics.  Outcome: Progressing

## 2020-04-03 ENCOUNTER — Inpatient Hospital Stay (HOSPITAL_COMMUNITY): Payer: Medicare Other | Admitting: Physical Therapy

## 2020-04-03 ENCOUNTER — Inpatient Hospital Stay (HOSPITAL_COMMUNITY): Payer: Medicare Other | Admitting: Occupational Therapy

## 2020-04-03 DIAGNOSIS — D702 Other drug-induced agranulocytosis: Secondary | ICD-10-CM

## 2020-04-03 LAB — CBC WITH DIFFERENTIAL/PLATELET
Abs Immature Granulocytes: 0.02 10*3/uL (ref 0.00–0.07)
Basophils Absolute: 0 10*3/uL (ref 0.0–0.1)
Basophils Relative: 1 %
Eosinophils Absolute: 0.1 10*3/uL (ref 0.0–0.5)
Eosinophils Relative: 4 %
HCT: 36.2 % — ABNORMAL LOW (ref 39.0–52.0)
Hemoglobin: 11.7 g/dL — ABNORMAL LOW (ref 13.0–17.0)
Immature Granulocytes: 1 %
Lymphocytes Relative: 34 %
Lymphs Abs: 0.9 10*3/uL (ref 0.7–4.0)
MCH: 29.2 pg (ref 26.0–34.0)
MCHC: 32.3 g/dL (ref 30.0–36.0)
MCV: 90.3 fL (ref 80.0–100.0)
Monocytes Absolute: 0.3 10*3/uL (ref 0.1–1.0)
Monocytes Relative: 10 %
Neutro Abs: 1.4 10*3/uL — ABNORMAL LOW (ref 1.7–7.7)
Neutrophils Relative %: 50 %
Platelets: 297 10*3/uL (ref 150–400)
RBC: 4.01 MIL/uL — ABNORMAL LOW (ref 4.22–5.81)
RDW: 13 % (ref 11.5–15.5)
WBC: 2.7 10*3/uL — ABNORMAL LOW (ref 4.0–10.5)
nRBC: 0 % (ref 0.0–0.2)

## 2020-04-03 LAB — GLUCOSE, CAPILLARY
Glucose-Capillary: 115 mg/dL — ABNORMAL HIGH (ref 70–99)
Glucose-Capillary: 151 mg/dL — ABNORMAL HIGH (ref 70–99)
Glucose-Capillary: 125 mg/dL — ABNORMAL HIGH (ref 70–99)
Glucose-Capillary: 122 mg/dL — ABNORMAL HIGH (ref 70–99)

## 2020-04-03 LAB — COMPREHENSIVE METABOLIC PANEL
ALT: 38 U/L (ref 0–44)
AST: 54 U/L — ABNORMAL HIGH (ref 15–41)
Albumin: 2.2 g/dL — ABNORMAL LOW (ref 3.5–5.0)
Alkaline Phosphatase: 61 U/L (ref 38–126)
Anion gap: 9 (ref 5–15)
BUN: 17 mg/dL (ref 8–23)
CO2: 24 mmol/L (ref 22–32)
Calcium: 9.1 mg/dL (ref 8.9–10.3)
Chloride: 103 mmol/L (ref 98–111)
Creatinine, Ser: 0.65 mg/dL (ref 0.61–1.24)
GFR calc Af Amer: >60 mL/min (ref >60)
GFR calc non Af Amer: >60 mL/min (ref >60)
Glucose, Bld: 127 mg/dL — ABNORMAL HIGH (ref 70–99)
Potassium: 4.2 mmol/L (ref 3.5–5.1)
Sodium: 136 mmol/L (ref 135–145)
Total Bilirubin: 0.2 mg/dL — ABNORMAL LOW (ref 0.3–1.2)
Total Protein: 6.1 g/dL — ABNORMAL LOW (ref 6.5–8.1)

## 2020-04-03 MED ORDER — EMPAGLIFLOZIN 10 MG PO TABS
10.0000 mg | ORAL_TABLET | Freq: Every day | ORAL | Status: DC
  Administered 2020-04-03 – 2020-04-24 (×22): 10 mg via ORAL
  Filled 2020-04-03 (×23): qty 1

## 2020-04-03 NOTE — Consult Note (Signed)
WOC Nurse Consult Note: Patient receiving care in [facility] [phone].  Primary RN present at the time of my assessment, and fortunately had this patient last week and was able to speak to the appearance of the areas today compared to when she cared for him last.  She stated last week the areas were pink only, nothing more. Reason for Consult: sacral wound Wound type: deep tissue pressure injuries Pressure Injury POA: No Measurement: right buttock/sacral wound is maroon and blood filled and measures 9 cm x 5 cm; the overlying skin is beginning to lift off.  The coccyx area wound measures 4 cm x 0.8 cm and is maroon, without blood filling. Wound bed: as above Drainage (amount, consistency, odor) none Periwound: pink, but blanchable Dressing procedure/placement/frequency:  Continue the use of the foam dressing. Turn every 2 hours and avoid pressure to the areas. I have entered an order for a mattress with low air loss.  Both his primary RN and I explained the importance of keeping the pressure off of the areas. Monitor the wound area(s) for worsening of condition such as: Signs/symptoms of infection,  Increase in size,  Development of or worsening of odor, Development of pain, or increased pain at the affected locations.  Notify the medical team if any of these develop.  Thank you for the consult.  Discussed plan of care with the patient and bedside nurse. Val Riles, RN, MSN, CWOCN, CNS-BC, pager [phone]

## 2020-04-03 NOTE — Progress Notes (Signed)
Physical Therapy Session Note  Patient Details  Name: Brent [name]. MRN: [id number] Date of Birth: May 29, 1946  Today's Date: 04/03/2020 PT Individual Time: 5697-9480 PT Individual Time Calculation (min): 45 min   Short Term Goals: Week 2:  PT Short Term Goal 1 (Week 2): Pt will perform rolling L/R with mod A x 1 PT Short Term Goal 2 (Week 2): Pt will initiate power wheelchair mobility PT Short Term Goal 3 (Week 2): Pt will maintain static sitting balance x 5 min with CGA  Skilled Therapeutic Interventions/Progress Updates:    Pt received seated in bed, agreeable to PT session. Pt reports ongoing pain in L upper trap area, not rated and reports he is utilizing lidocaine patch in the evening for pain relief. Assisted pt with doffing PRAFOs and donning thigh-high TED hose at bed level. Supine to sit with max A via logroll for trunk elevation and BLE management. Pt is mod A for sitting balance EOB due to posterior and R lateral lean. Introduced power wheelchair to patient this session. He is open to trialing use of a power wheelchair for increased independence with functional mobility. Slide board transfer with total A x 2 to [place]. Reviewed how to turn PWC on/off and joystick controller. Pt is able to perform power wheelchair mobility x 200 ft with Supervision cueing for safety. Pt does tend to activate speed control adjuster while navigating joystick and needs cues for adjusting hand placement to avoid changing speed during wheelchair mobility. Slide board transfer back to bed then to TIS w/c with total A x 2. Pt left semi-reclined in TIS w/c in room so that power wheelchair can charge until next session due to low battery. Will continue to review power wheelchair mobility and further controls including tilt and recline in future sessions. Needs in reach, quick release belt and chair alarm in place at end of session.  Therapy Documentation Precautions:  Precautions Precautions: Fall Precaution  Comments: quadriplegia Restrictions Weight Bearing Restrictions: No    Therapy/Group: Individual Therapy   Excell Seltzer, PT, DPT  04/03/2020, 10:16 AM

## 2020-04-03 NOTE — Progress Notes (Addendum)
[name] PHYSICAL MEDICINE & REHABILITATION PROGRESS NOTE  Subjective/Complaints: Patient seen sitting up at the edge of his bed this AM.  He states he did not sleep overnight due to another patient having medical issues all primary on the unit.  He is working with therapies.  States he did not receive a suppository yesterday-discussed with nursing.  ROS: Denies CP, SOB, N/V/D  Objective: Vital Signs: Blood pressure (!) 130/57, pulse (!) 58, temperature 98.4 F (36.9 C), resp. rate 16, height 6' (1.829 m), weight 108.4 kg, SpO2 98 %. No results found. Recent Labs    04/03/20 0551  WBC 2.7*  HGB 11.7*  HCT 36.2*  PLT 297   Recent Labs    04/03/20 0551  NA 136  K 4.2  CL 103  CO2 24  GLUCOSE 127*  BUN 17  CREATININE 0.65  CALCIUM 9.1    Physical Exam: BP (!) 130/57 (BP Location: Right Arm)   Pulse (!) 58   Temp 98.4 F (36.9 C)   Resp 16   Ht 6' (1.829 m)   Wt 108.4 kg   SpO2 98%   BMI 32.41 kg/m  Constitutional: No distress . Vital signs reviewed. HENT: Normocephalic.  Atraumatic. Eyes: EOMI. No discharge. Cardiovascular: No JVD. Respiratory: Normal effort.  No stridor. GI: Non-distended. GU: +Foley Skin: Sacral wound not examined today due to positioning. Psych: Normal mood.  Normal behavior. Musc: No edema in extremities.  No tenderness in extremities. Neuro: Alert Aware of deficits Motor:  RUE: Shoulder abduction, elbow flexion 5/5, wrist extension 4+/5, elbow ext 4+/5, distally 0/5, unchanged LUE: Shoulder abduction, elbow flexion 5/5, wrist extension 4+/5, elbow ext 4/5, hand grip 0/5, unchanged B/l LE: 0/5 proxima to distal, unchanged  Assessment/Plan: 1. Functional deficits secondary to NMO/transverse myelitis which require 3+ hours per day of interdisciplinary therapy in a comprehensive inpatient rehab setting.  Physiatrist is providing close team supervision and 24 hour management of active medical problems listed below.  Physiatrist and  rehab team continue to assess barriers to discharge/monitor patient progress toward functional and medical goals  Care Tool:  Bathing    Body parts bathed by patient: Chest, Abdomen, Right arm, Left arm, Face   Body parts bathed by helper: Front perineal area, Buttocks, Right upper leg, Left upper leg, Right lower leg, Left lower leg     Bathing assist Assist Level: Moderate Assistance - Patient 50 - 74%     Upper Body Dressing/Undressing Upper body dressing Upper body dressing/undressing activity did not occur (including orthotics): N/A What is the patient wearing?: Pull over shirt    Upper body assist Assist Level: Maximal Assistance - Patient 25 - 49%    Lower Body Dressing/Undressing Lower body dressing      What is the patient wearing?: Pants, Incontinence brief     Lower body assist Assist for lower body dressing: Dependent - Patient 0%     Toileting Toileting    Toileting assist Assist for toileting: Dependent - Patient 0%     Transfers Chair/bed transfer  Transfers assist     Chair/bed transfer assist level: 2 Helpers     Locomotion Ambulation   Ambulation assist   Ambulation activity did not occur: Safety/medical concerns (quadriplegia, decreased balance/postural control)          Walk 10 feet activity   Assist  Walk 10 feet activity did not occur: Safety/medical concerns (quadriplegia, decreased balance/postural control)        Walk 50 feet activity   Assist  Walk 50 feet with 2 turns activity did not occur: Safety/medical concerns (quadriplegia, decreased balance/postural control)         Walk 150 feet activity   Assist Walk 150 feet activity did not occur: Safety/medical concerns (quadriplegia, decreased balance/postural control)         Walk 10 feet on uneven surface  activity   Assist Walk 10 feet on uneven surfaces activity did not occur: Safety/medical concerns (quadriplegia, decreased balance/postural  control)         Wheelchair     Assist Will patient use wheelchair at discharge?: Yes Type of Wheelchair: Manual    Wheelchair assist level: Dependent - Patient 0% Max wheelchair distance: 155ft    Wheelchair 50 feet with 2 turns activity    Assist        Assist Level: Dependent - Patient 0%   Wheelchair 150 feet activity     Assist     Assist Level: Dependent - Patient 0%      Medical Problem List and Plan: 1.  Quadriparesis secondary to transverse myelitis/NMO.  IV IG completed 03/20/2020 as well as received Rituxan dose 03/24/2020, plan for repeat dose ~7/20.  Continue CIR  B/l PRAFO nightly 2.  Antithrombotics: -DVT/anticoagulation: Lovenox                Vascular study negative for DVT             -antiplatelet therapy: Aspirin 81 mg daily 3. Pain Management: Lidoderm patch, tramadol as needed  Controlled on 7/16  Monitor with increased exertion 4. Mood: Zoloft 150 mg nightly, Ativan as needed             -antipsychotic agents: N/A 5. Neuropsych: This patient is capable of making decisions on his own behalf. 6. Skin/Wound Care: Routine skin checks  Air mattress ordered 7. Fluids/Electrolytes/Nutrition: Routine in and outs. CMP ordered 8.  Diabetes mellitus type 2 with hyperglycemia.  Hemoglobin A1c 6.7.    On Jardiance 12.5 mg daily, Metformin 1000 mg twice daily, Lantus 30 units, NovoLog 25-30 units 3 times daily PTA  Lantus insulin 30 units daily  Metformin 500 twice daily started on 7/13  Jardiance 10 started on 7/16  Home medications discussed with pharmacy  Monitor the increased mobility  9. HCAP/OSA.  Completed course of Maxipime initiated 03/18/2020 x14 doses.  Continue BiPAP 10. Hyperlipidemia. Lipitor/Vascepa 11. Hypertension.  Lisinopril 20 mg daily  Controlled on 7/15  Monitor with increased mobility 12.  Transaminitis  LFTs elevated, but improving on 7/16  Continue to monitor 13.  Leukopenia-likely medication induced  WBCs 2.7 on  7/16, labs ordered for Monday  Discussed with pharmacy - ANC WNL  Continue to monitor 14.  Acute blood loss anemia  Hemoglobin 11.7 on 7/16  Continue to monitor 10.  Neurogenic bowel/bladder-discussed with nursing  Indwelling Foley due to traumatic I/O caths  Bowel program adjusted on 7/11, decreased on 7/12     LOS: 9 days A FACE TO FACE EVALUATION WAS PERFORMED  Brent [name] Lorie Phenix 04/03/2020, 9:27 AM

## 2020-04-03 NOTE — Progress Notes (Signed)
Occupational Therapy Weekly Progress Note  Patient Details  Name: Brent [name]. MRN: [id number] Date of Birth: 1946/09/02  Beginning of progress report period: March 26, 2020 End of progress report period: April 03, 2020  Today's Date: 04/03/2020 OT Individual Time: 1122-1208 OT Individual Time Calculation (min): 46 min    Patient has met 2 of 5 short term goals.  Pt is making slow progress towards goals due to level of impairment.  Pt is able to complete rolling at bed level and sidelying to sitting max assist of one caregiver, continue to require up to +2 assistance for hygiene and LB dressing.  Have introduced transfers via slide board requiring max-total +2 due to decreased weight shift and ability to clear buttocks. Have also introduced use of mechanical lift due to areas of concern on buttocks due to decreased ability to weight shift and roll for pressure relief both in supine and when seated upright.  Pt is able to tolerate sitting OOB 2-4 hours at a time, however has demonstrated mild hypotension and reports of dizziness with activity after prolonged sitting OOB.  Pt is demonstrating improved functional use of BUE, Rt> Lt, with loose gross grasp and use of built up handles and u-cuff for self-feeding and to press buttons on phone and call bell/remote.  Pt continues to demonstrate decreased pinch/grasp and finger isolation for functional use of BUE.  Pt remains motivated and exhibits great participation in therapy sessions as he is able. Pt has been limited at times due to fluctuations in blood pressure and decreased overall endurance.  Patient continues to demonstrate the following deficits: muscle weakness and muscle paralysis, decreased cardiorespiratoy endurance, abnormal tone, unbalanced muscle activation and motor apraxia and decreased sitting balance and decreased postural control and therefore will continue to benefit from skilled OT intervention to enhance overall performance with  BADL and Reduce care partner burden.  Patient progressing toward long term goals..  Continue plan of care.  OT Short Term Goals Week 1:  OT Short Term Goal 1 (Week 1): patient will increase bilateral hand ROM/dexterity and strength to perform eating and basic grooming tasks with min A OT Short Term Goal 1 - Progress (Week 1): Met OT Short Term Goal 2 (Week 1): patient will increase UB adl to mod A bed level - progress to seated in w/c as able OT Short Term Goal 2 - Progress (Week 1): Met OT Short Term Goal 3 (Week 1): patient will roll in bed with mod A and tolerate sitting in TIS w/c for 1 hour OT Short Term Goal 3 - Progress (Week 1): Partly met OT Short Term Goal 4 (Week 1): patient will direct lift transfer with mod cues OT Short Term Goal 4 - Progress (Week 1): Progressing toward goal OT Short Term Goal 5 (Week 1): patient will monitor and direct weight shifts with min cues OT Short Term Goal 5 - Progress (Week 1): Progressing toward goal Week 2:  OT Short Term Goal 1 (Week 2): Pt will complete LB bathing at bed level, utilizing circle sitting/figure 4 and AE as needed, with mod assist OT Short Term Goal 2 (Week 2): Pt will don pants at bed level with max assist, utilizing circle sitting/figure 4 and AE as needed. OT Short Term Goal 3 (Week 2): Pt will maintain upright sitting position with CGA for 2 mins to demonstrate increased postural control as needed for UB dressing OT Short Term Goal 4 (Week 2): Pt will monitor and direct weight shifts  when OOB with min cues  Skilled Therapeutic Interventions/Progress Updates:    Treatment session with focus on self-care retraining and functional transfers.  Pt received reclined in TIS w/c.  Pt expressing desire to bathe and change clothing.  Engaged in [place] bathing, dressing, and grooming tasks while seated in w/c at sink.  Pt required setup assist to apply toothpaste, but pt able to turn water on and utilized BUE together when using standard  toothbrush without built up handle. Doffed shirt with max assist.  Therapist provided pt with wash mit and assisted pt with donning mit to wash UB and face due to decreased sustained grasp on washcloth.  Pt able to don shirt with min assist and mod cues for use of hands as gross assist when threading sleeves over arms.  Returned to bed total assist +2 via slide board due to decreased weight shifting and inability to clear buttocks during transfers.  Engaged in [place] with pt able to complete with max assist with use of bed rails.  Noted pt to be incontinent of stool.  Completed hygiene total assist in sidelying and removed foam pad from backside due to soiled.  Therapist notified RN of breakdown/discoloration on sacrum when pad removed.  Encouraged and educated pt on maintaining in sidelying positioning.  RN in to assess skin and apply clean foam and clean brief.  Notified MD of skin issues on butt.  Therapy Documentation Precautions:  Precautions Precautions: Fall Precaution Comments: quadriplegia Restrictions Weight Bearing Restrictions: No General:   Vital Signs: Therapy Vitals Temp: 98.4 F (36.9 C) Pulse Rate: (!) 58 Resp: 16 BP: (!) 130/57 Patient Position (if appropriate): Lying Oxygen Therapy SpO2: 98 % O2 Device: Room Air Pain:   Pt with no c/o pain  Therapy/Group: Individual Therapy  Brent [name] 04/03/2020, 9:23 AM

## 2020-04-03 NOTE — Progress Notes (Signed)
Occupational Therapy Session Note  Patient Details  Name: Brent [name]. MRN: [id number] Date of Birth: [birth year]  Today's Date: 04/03/2020 OT Individual Time: 4158-3094 OT Individual Time Calculation (min): 80 min    Short Term Goals: Week 2:  OT Short Term Goal 1 (Week 2): Pt will complete LB bathing at bed level, utilizing circle sitting/figure 4 and AE as needed, with mod assist OT Short Term Goal 2 (Week 2): Pt will don pants at bed level with max assist, utilizing circle sitting/figure 4 and AE as needed. OT Short Term Goal 3 (Week 2): Pt will maintain upright sitting position with CGA for 2 mins to demonstrate increased postural control as needed for UB dressing OT Short Term Goal 4 (Week 2): Pt will monitor and direct weight shifts when OOB with min cues  Skilled Therapeutic Interventions/Progress Updates:    Treatment session with focus on bed mobility, LB dressing, education on pressure relief, and functional mobility in power w/c.  Pt received in sidelying in bed.  Pt updated therapist on wound care RN recommendations.  Therapist discussed with pt, plan to utilize lift primarily at this time for transfers to decrease pressure/sheering of wound.  Engaged in hygiene at bed level with focus on rolling Rt and Lt to clean incontinent BM.  Pt able to roll with max assist with use of bed rails.  Donned pants total assist at bed level with +2 to maintain positioning in sidelying when pulling pants over hips.  Transferred to power w/c via [facility] to preserve integrity of wound.  Educated pt on tilt and lift settings of power w/c.  Educated on recommendation for tilting every 20-30 mins for pressure relief, provided and set timer to allow for increased recall and reminder of need to complete regular tilting.  Pt able to demonstrate switching between settings to drive and to adjust tilt of w/c with increased time and effort to select buttons due to decreased finger function.  Pt able to  complete tilt function and demonstrate turning off and resetting timer.  Pt maneuvered through hallway and doorways with supervision and min cues to avoid doorways on Lt.  Remained upright in power w/c in semi-tilted position with gait belt around thighs for improved posture, seat belt alarm on, and all needs in reach.  RN in room setting up new air mattress bed.  Therapy Documentation Precautions:  Precautions Precautions: Fall Precaution Comments: quadriplegia Restrictions Weight Bearing Restrictions: No General:   Vital Signs: Therapy Vitals Temp: 98.1 F (36.7 C) Temp Source: Oral Pulse Rate: (!) 52 Resp: 18 BP: (!) 123/56 Patient Position (if appropriate): Lying Oxygen Therapy SpO2: 99 % O2 Device: Room Air Pain:  Pt with no c/o pain   Therapy/Group: Individual Therapy  Simonne Come 04/03/2020, 3:19 PM

## 2020-04-03 NOTE — Plan of Care (Signed)
°  Problem: RH PAIN MANAGEMENT Goal: RH STG PAIN MANAGED AT OR BELOW PT'S PAIN GOAL Description: Less than 4 Outcome: Progressing   Problem: SCI BLADDER ELIMINATION Goal: RH STG SCI MANAGE BLADDER PROGRAM W/ASSISTANCE Description: Manage bladder with max assist Outcome: Progressing

## 2020-04-04 ENCOUNTER — Inpatient Hospital Stay (HOSPITAL_COMMUNITY): Payer: Medicare Other | Admitting: Occupational Therapy

## 2020-04-04 LAB — GLUCOSE, CAPILLARY
Glucose-Capillary: 83 mg/dL (ref 70–99)
Glucose-Capillary: 93 mg/dL (ref 70–99)
Glucose-Capillary: 113 mg/dL — ABNORMAL HIGH (ref 70–99)
Glucose-Capillary: 114 mg/dL — ABNORMAL HIGH (ref 70–99)

## 2020-04-04 MED ORDER — SACCHAROMYCES BOULARDII 250 MG PO CAPS
250.0000 mg | ORAL_CAPSULE | Freq: Two times a day (BID) | ORAL | Status: DC
  Administered 2020-04-04 – 2020-04-24 (×41): 250 mg via ORAL
  Filled 2020-04-04 (×41): qty 1

## 2020-04-04 NOTE — Progress Notes (Signed)
[place] PHYSICAL MEDICINE & REHABILITATION PROGRESS NOTE  Subjective/Complaints: Overall doing fairly well. Reports that stools are still loose. Had a few mushy stools after suppository last night. Looks like miralax and colace were stopped over the last couple days  ROS: Patient denies fever, rash, sore throat, blurred vision, nausea, vomiting, diarrhea, cough, shortness of breath or chest pain,  headache, or mood change.   Objective: Vital Signs: Blood pressure (!) 112/47, pulse (!) 58, temperature 97.9 F (36.6 C), temperature source Oral, resp. rate 16, height 6' (1.829 m), weight 108.4 kg, SpO2 98 %. No results found. Recent Labs    04/03/20 0551  WBC 2.7*  HGB 11.7*  HCT 36.2*  PLT 297   Recent Labs    04/03/20 0551  NA 136  K 4.2  CL 103  CO2 24  GLUCOSE 127*  BUN 17  CREATININE 0.65  CALCIUM 9.1    Physical Exam: BP (!) 112/47 (BP Location: Left Arm)   Pulse (!) 58   Temp 97.9 F (36.6 C) (Oral)   Resp 16   Ht 6' (1.829 m)   Wt 108.4 kg   SpO2 98%   BMI 32.41 kg/m  Constitutional: No distress . Vital signs reviewed. HEENT: EOMI, oral membranes moist Neck: supple Cardiovascular: RRR without murmur. No JVD    Respiratory/Chest: CTA Bilaterally without wheezes or rales. Normal effort    GI/Abdomen: BS +, non-tender, non-distended Ext: no clubbing, cyanosis, or edema Psych: pleasant and cooperative. GU: +Foley Skin: Sacral wound not examined Musc: No edema in extremities.  No tenderness in extremities. Neuro: Alert Aware of deficits Motor:  RUE: Shoulder abduction, elbow flexion 5/5, wrist extension 4+/5, elbow ext 4+/5, distally 0/5, no change LUE: Shoulder abduction, elbow flexion 5/5, wrist extension 4+/5, elbow ext 4/5, hand grip 0/5, unchanged B/l LE: 0/5 proxima to distal, no change  Assessment/Plan: 1. Functional deficits secondary to NMO/transverse myelitis which require 3+ hours per day of interdisciplinary therapy in a comprehensive  inpatient rehab setting.  Physiatrist is providing close team supervision and 24 hour management of active medical problems listed below.  Physiatrist and rehab team continue to assess barriers to discharge/monitor patient progress toward functional and medical goals  Care Tool:  Bathing    Body parts bathed by patient: Chest, Abdomen, Right arm, Left arm, Face   Body parts bathed by helper: Front perineal area, Buttocks, Right upper leg, Left upper leg, Right lower leg, Left lower leg     Bathing assist Assist Level: Moderate Assistance - Patient 50 - 74%     Upper Body Dressing/Undressing Upper body dressing Upper body dressing/undressing activity did not occur (including orthotics): N/A What is the patient wearing?: Pull over shirt    Upper body assist Assist Level: Maximal Assistance - Patient 25 - 49%    Lower Body Dressing/Undressing Lower body dressing      What is the patient wearing?: Pants, Incontinence brief     Lower body assist Assist for lower body dressing: Dependent - Patient 0%     Toileting Toileting    Toileting assist Assist for toileting: Dependent - Patient 0%     Transfers Chair/bed transfer  Transfers assist     Chair/bed transfer assist level: 2 Helpers     Locomotion Ambulation   Ambulation assist   Ambulation activity did not occur: Safety/medical concerns (quadriplegia, decreased balance/postural control)          Walk 10 feet activity   Assist  Walk 10 feet activity did  not occur: Safety/medical concerns (quadriplegia, decreased balance/postural control)        Walk 50 feet activity   Assist Walk 50 feet with 2 turns activity did not occur: Safety/medical concerns (quadriplegia, decreased balance/postural control)         Walk 150 feet activity   Assist Walk 150 feet activity did not occur: Safety/medical concerns (quadriplegia, decreased balance/postural control)         Walk 10 feet on uneven  surface  activity   Assist Walk 10 feet on uneven surfaces activity did not occur: Safety/medical concerns (quadriplegia, decreased balance/postural control)         Wheelchair     Assist Will patient use wheelchair at discharge?: Yes Type of Wheelchair: Power    Wheelchair assist level: Supervision/Verbal cueing Max wheelchair distance: 200'    Wheelchair 50 feet with 2 turns activity    Assist        Assist Level: Supervision/Verbal cueing   Wheelchair 150 feet activity     Assist     Assist Level: Supervision/Verbal cueing      Medical Problem List and Plan: 1.  Quadriparesis secondary to transverse myelitis/NMO.  IV IG completed 03/20/2020 as well as received Rituxan dose 03/24/2020, plan for repeat dose ~7/20.  Continue CIR  B/l PRAFO nightly 2.  Antithrombotics: -DVT/anticoagulation: Lovenox                Vascular study negative for DVT             -antiplatelet therapy: Aspirin 81 mg daily 3. Pain Management: Lidoderm patch, tramadol as needed  Controlled on 7/16  Monitor with increased exertion 4. Mood: Zoloft 150 mg nightly, Ativan as needed             -antipsychotic agents: N/A 5. Neuropsych: This patient is capable of making decisions on his own behalf. 6. Skin/Wound Care: Routine skin checks  Air mattress ordered 7. Fluids/Electrolytes/Nutrition: Routine in and outs. CMP ordered 8.  Diabetes mellitus type 2 with hyperglycemia.  Hemoglobin A1c 6.7.    On Jardiance 12.5 mg daily, Metformin 1000 mg twice daily, Lantus 30 units, NovoLog 25-30 units 3 times daily PTA  Lantus insulin 30 units daily  Metformin 500 twice daily started on 7/13  Jardiance 10 started on 7/16  7/17 cbg control reasonable right now  9. HCAP/OSA.  Completed course of Maxipime initiated 03/18/2020 x14 doses.  Continue BiPAP 10. Hyperlipidemia. Lipitor/Vascepa 11. Hypertension.  Lisinopril 20 mg daily  Controlled on 7/17  Monitor with increased mobility 12.   Transaminitis  LFTs elevated, but improving on 7/16  Continue to monitor 13.  Leukopenia-likely medication induced  WBCs 2.7 on 7/16, labs ordered for Monday  Discussed with pharmacy - ANC WNL  Continue to monitor 14.  Acute blood loss anemia  Hemoglobin 11.7 on 7/16  Continue to monitor 10.  Neurogenic bowel/bladder-discussed with nursing  Indwelling Foley due to traumatic I/O caths  Bowel program: still having mushy stools   -miralax just stopped yesterday   -will add probiotic   -continue PM suppository   -has been on metformin at home PTA     LOS: 10 days A FACE TO [place] 04/04/2020, 9:06 AM

## 2020-04-04 NOTE — Plan of Care (Signed)
  Problem: SCI BOWEL ELIMINATION Goal: RH STG SCI MANAGE BOWEL PROGRAM W/ASSIST OR AS APPROPRIATE Description: STG SCI Manage bowel program w/assist or as appropriate. Max assist Outcome: Not Progressing; incontinence Problem: SCI BLADDER ELIMINATION Goal: RH STG SCI MANAGE BLADDER PROGRAM W/ASSISTANCE Description: Manage bladder with max assist Outcome: Not Progressing; foley cath

## 2020-04-04 NOTE — Progress Notes (Signed)
Bowel program started. Patient has loose stool.

## 2020-04-05 ENCOUNTER — Inpatient Hospital Stay (HOSPITAL_COMMUNITY): Payer: Medicare Other

## 2020-04-05 ENCOUNTER — Inpatient Hospital Stay (HOSPITAL_COMMUNITY): Payer: Medicare Other | Admitting: Occupational Therapy

## 2020-04-05 LAB — GLUCOSE, CAPILLARY
Glucose-Capillary: 88 mg/dL (ref 70–99)
Glucose-Capillary: 151 mg/dL — ABNORMAL HIGH (ref 70–99)
Glucose-Capillary: 123 mg/dL — ABNORMAL HIGH (ref 70–99)
Glucose-Capillary: 145 mg/dL — ABNORMAL HIGH (ref 70–99)

## 2020-04-05 MED ORDER — FLUTICASONE PROPIONATE 50 MCG/ACT NA SUSP: 2.0000 | Freq: Two times a day (BID) | NASAL | Status: DC

## 2020-04-05 MED ORDER — FLUTICASONE PROPIONATE 50 MCG/ACT NA SUSP
2.0000 | Freq: Two times a day (BID) | NASAL | Status: DC
  Administered 2020-04-05 – 2020-04-23 (×28): 2 via NASAL
  Filled 2020-04-05 (×2): qty 16

## 2020-04-05 MED ORDER — PSYLLIUM 95 % PO PACK
1.0000 | PACK | Freq: Every day | ORAL | Status: DC
  Administered 2020-04-05 – 2020-04-09 (×5): 1 via ORAL
  Filled 2020-04-05 (×5): qty 1

## 2020-04-05 NOTE — Progress Notes (Signed)
Physical Therapy Session Note  Patient Details  Name: Brent [name]. MRN: [id number] Date of Birth: [birth year]  Today's Date: 04/05/2020 PT Individual Time: 0800-0900 PT Individual Time Calculation (min): 60 min   Short Term Goals: Week 1:  PT Short Term Goal 1 (Week 1): Pt will roll L and R with mod A and use of bed features PT Short Term Goal 1 - Progress (Week 1): Progressing toward goal PT Short Term Goal 2 (Week 1): Pt will transfer supine<>sitting EOB with max A of 1 PT Short Term Goal 2 - Progress (Week 1): Progressing toward goal PT Short Term Goal 3 (Week 1): Pt will transfer bed<>WC with LRAD max A of 1 PT Short Term Goal 3 - Progress (Week 1): Progressing toward goal Week 2:  PT Short Term Goal 1 (Week 2): Pt will perform rolling L/R with mod A x 1 PT Short Term Goal 2 (Week 2): Pt will initiate power wheelchair mobility PT Short Term Goal 3 (Week 2): Pt will maintain static sitting balance x 5 min with CGA  Skilled Therapeutic Interventions/Progress Updates:    PAIN denies pain this am  Pt initially supine and agreeable to session.  Dependent for donning of Ted Hose and pants to thighs.  Rolls L and R using UEs/rails and max assist for dependent completion of lower body dressing.   Pt supine to R side to sit w/max assist.    Worked on trunk control/sitting balance on edge of bed w/feet supported.  Pt w/post tendency, fearful of falling forwards and of sliding but cooperative and reassured by therapist.  Pt performed sitting w/hands on knees, transitions from midline to side on elbow/return to midline/mod assist, A/P wt shifting, lifting single hand from knee and returning to midline, etc.  Sits w/cga to mod assist w/activities/challenges.   Pt removes night shirt and dons clean t shirt w/max assist to complete and min to max assist for balance.   Pt returned to supine w/max assist and cues for sequencing.   Pt rolls L/R w/max assist for therapist to place sling.  NT in  to assist w/maximove lift transfer to wc/performed to avoid shear to areas of skin breakdown.  Once in wc pt fully tilted for optimal positioning by therapist, returned to upright, seatbelt fastened and gait belt used to promote hip alignment.    Reviewed pressure relief schedule and operation of joystick control for both driving and tilting of wc/how to change between, powering on/off wc.  Pt then demonstrated competent performance of tilting wc/ pressure relief x 2 min.    Pt propelled PWC in hallway w/supervision on lowest speed setting.  Due to session timing NT agreed to assist pt back to room and set up w/needs.  Pt agreed to perform pressure relief as instructed while OOB.   Therapy Documentation Precautions:  Precautions Precautions: Fall Precaution Comments: quadriplegia Restrictions Weight Bearing Restrictions: No    Therapy/Group: Individual Therapy  Callie Fielding, [place] 04/05/2020, 4:48 PM

## 2020-04-05 NOTE — Plan of Care (Signed)
  Problem: SCI BOWEL ELIMINATION Goal: RH STG SCI MANAGE BOWEL PROGRAM W/ASSIST OR AS APPROPRIATE Description: STG SCI Manage bowel program w/assist or as appropriate. Max assist Outcome: Not Progressing; bowel program   Problem: SCI BLADDER ELIMINATION Goal: RH STG SCI MANAGE BLADDER PROGRAM W/ASSISTANCE Description: Manage bladder with max assist Outcome: Not Progressing; foley

## 2020-04-05 NOTE — Progress Notes (Signed)
[place] PHYSICAL MEDICINE & REHABILITATION PROGRESS NOTE  Subjective/Complaints: Still having loose stool after suppository. Had 4 or 5 episodes last night--no abnl odor, afebrile  ROS: Patient denies fever, rash, sore throat, blurred vision, nausea, vomiting,  cough, shortness of breath or chest pain, joint or back pain, headache, or mood change.   Objective: Vital Signs: Blood pressure (!) 108/46, pulse (!) 53, temperature 98.2 F (36.8 C), temperature source Oral, resp. rate 16, height 6' (1.829 m), weight 108.4 kg, SpO2 96 %. No results found. Recent Labs    04/03/20 0551  WBC 2.7*  HGB 11.7*  HCT 36.2*  PLT 297   Recent Labs    04/03/20 0551  NA 136  K 4.2  CL 103  CO2 24  GLUCOSE 127*  BUN 17  CREATININE 0.65  CALCIUM 9.1    Physical Exam: BP (!) 108/46 (BP Location: Right Arm)   Pulse (!) 53   Temp 98.2 F (36.8 C) (Oral)   Resp 16   Ht 6' (1.829 m)   Wt 108.4 kg   SpO2 96%   BMI 32.41 kg/m  Constitutional: No distress . Vital signs reviewed. HEENT: EOMI, oral membranes moist Neck: supple Cardiovascular: RRR without murmur. No JVD    Respiratory/Chest: CTA Bilaterally without wheezes or rales. Normal effort    GI/Abdomen: BS +, non-tender, non-distended Ext: no clubbing, cyanosis, or edema Psych: pleasant and cooperative GU: +Foley Skin: Sacral wound not examined Musc: No edema in extremities.  No tenderness in extremities. Neuro: Alert Aware of deficits Motor:  RUE: Shoulder abduction, elbow flexion 5/5, wrist extension 4+/5, elbow ext 4+/5, distally 0/5, no change LUE: Shoulder abduction, elbow flexion 5/5, wrist extension 4+/5, elbow ext 4/5, hand grip 0/5, unchanged B/l LE: 0/5 proxima to distal, no change  Assessment/Plan: 1. Functional deficits secondary to NMO/transverse myelitis which require 3+ hours per day of interdisciplinary therapy in a comprehensive inpatient rehab setting.  Physiatrist is providing close team supervision and  24 hour management of active medical problems listed below.  Physiatrist and rehab team continue to assess barriers to discharge/monitor patient progress toward functional and medical goals  Care Tool:  Bathing    Body parts bathed by patient: Chest, Abdomen, Right arm, Left arm, Face   Body parts bathed by helper: Front perineal area, Buttocks, Right upper leg, Left upper leg, Right lower leg, Left lower leg     Bathing assist Assist Level: Moderate Assistance - Patient 50 - 74%     Upper Body Dressing/Undressing Upper body dressing Upper body dressing/undressing activity did not occur (including orthotics): N/A What is the patient wearing?: Pull over shirt    Upper body assist Assist Level: Maximal Assistance - Patient 25 - 49%    Lower Body Dressing/Undressing Lower body dressing      What is the patient wearing?: Pants, Incontinence brief     Lower body assist Assist for lower body dressing: Dependent - Patient 0%     Toileting Toileting    Toileting assist Assist for toileting: Dependent - Patient 0%     Transfers Chair/bed transfer  Transfers assist     Chair/bed transfer assist level: 2 Helpers     Locomotion Ambulation   Ambulation assist   Ambulation activity did not occur: Safety/medical concerns (quadriplegia, decreased balance/postural control)          Walk 10 feet activity   Assist  Walk 10 feet activity did not occur: Safety/medical concerns (quadriplegia, decreased balance/postural control)  Walk 50 feet activity   Assist Walk 50 feet with 2 turns activity did not occur: Safety/medical concerns (quadriplegia, decreased balance/postural control)         Walk 150 feet activity   Assist Walk 150 feet activity did not occur: Safety/medical concerns (quadriplegia, decreased balance/postural control)         Walk 10 feet on uneven surface  activity   Assist Walk 10 feet on uneven surfaces activity did not occur:  Safety/medical concerns (quadriplegia, decreased balance/postural control)         Wheelchair     Assist Will patient use wheelchair at discharge?: Yes Type of Wheelchair: Power    Wheelchair assist level: Supervision/Verbal cueing Max wheelchair distance: 200'    Wheelchair 50 feet with 2 turns activity    Assist        Assist Level: Supervision/Verbal cueing   Wheelchair 150 feet activity     Assist     Assist Level: Supervision/Verbal cueing      Medical Problem List and Plan: 1.  Quadriparesis secondary to transverse myelitis/NMO.  IV IG completed 03/20/2020 as well as received Rituxan dose 03/24/2020, plan for repeat dose ~7/20.  Continue CIR  B/L PRAFO nightly 2.  Antithrombotics: -DVT/anticoagulation: Lovenox                Vascular study negative for DVT             -antiplatelet therapy: Aspirin 81 mg daily 3. Pain Management: Lidoderm patch, tramadol as needed  Controlled on 7/16  Monitor with increased exertion 4. Mood: Zoloft 150 mg nightly, Ativan as needed             -antipsychotic agents: N/A 5. Neuropsych: This patient is capable of making decisions on his own behalf. 6. Skin/Wound Care: Routine skin checks  Air mattress ordered 7. Fluids/Electrolytes/Nutrition: Routine in and outs. CMP ordered 8.  Diabetes mellitus type 2 with hyperglycemia.  Hemoglobin A1c 6.7.    On Jardiance 12.5 mg daily, Metformin 1000 mg twice daily, Lantus 30 units, NovoLog 25-30 units 3 times daily PTA  Lantus insulin 30 units daily  Metformin 500 twice daily started on 7/13  Jardiance 10 started on 7/16  7/18 cbg's controlled. Will hold metformin given #10 9. HCAP/OSA.  Completed course of Maxipime initiated 03/18/2020 x14 doses.  Continue BiPAP 10. Hyperlipidemia. Lipitor/Vascepa 11. Hypertension.  Lisinopril 20 mg daily  Controlled on 7/17  Monitor with increased mobility 12.  Transaminitis  LFTs elevated, but improving on 7/16  Continue to monitor 13.   Leukopenia-likely medication induced  WBCs 2.7 on 7/16, labs ordered for Monday  Discussed with pharmacy - ANC WNL  Continue to monitor 14.  Acute blood loss anemia  Hemoglobin 11.7 on 7/16  Continue to monitor 10.  Neurogenic bowel/bladder-discussed with nursing  Indwelling Foley due to traumatic I/O caths  Bowel program: still having mushy stools   -miralax stopped Friday   -added probiotic 7/17   -continue PM suppository   -has been on metformin at home PTA but will go ahead and stop this given loose stool.    -add metamucil      LOS: 11 days A FACE TO FACE EVALUATION WAS PERFORMED  Meredith Staggers 04/05/2020, 8:34 AM

## 2020-04-05 NOTE — Progress Notes (Signed)
Bowel program done. Small stool noted

## 2020-04-05 NOTE — Progress Notes (Signed)
Occupational Therapy Session Note  Patient Details  Name: Brent [name]. MRN: [id number] Date of Birth: 06-08-1946  Today's Date: 04/05/2020 OT Individual Time: 1400-1431 OT Individual Time Calculation (min): 31 min    Short Term Goals: Week 2:  OT Short Term Goal 1 (Week 2): Pt will complete LB bathing at bed level, utilizing circle sitting/figure 4 and AE as needed, with mod assist OT Short Term Goal 2 (Week 2): Pt will don pants at bed level with max assist, utilizing circle sitting/figure 4 and AE as needed. OT Short Term Goal 3 (Week 2): Pt will maintain upright sitting position with CGA for 2 mins to demonstrate increased postural control as needed for UB dressing OT Short Term Goal 4 (Week 2): Pt will monitor and direct weight shifts when OOB with min cues  Skilled Therapeutic Interventions/Progress Updates:    Treatment session with focus on functional use of BUE to complete grooming tasks and management of pressure relief in power w/c.  Pt received upright in power w/c reporting fatigue from being up most of the day.  Pt demonstrated how to complete tilting back in power w/c for pressure relief and recommended frequency for pressure relief.  Pt requested to shave his face.  Completed mobility to sink via power w/c controls with min cues for positioning to reach items at sink.  Pt required assistance to turn on and off electric razor despite use of built up stylus due to decreased strength and manipulation in BUE.  Pt able to shave with use of BUE on razor.  Setup in for transfer back to bed via maximove with focus on anterior and lateral weight shifts to place sling.  Pt incontinent of bowel during transfer back to bed.  Engaged in rolling with mod-max assist and use of bed rails to roll Rt and Lt to remove sling and complete perineal hygiene.  Pt passed off to nursing staff to complete hygiene and change of dressing.    Therapy Documentation Precautions:  Precautions Precautions:  Fall Precaution Comments: quadriplegia Restrictions Weight Bearing Restrictions: No Pain:  Pt with no c/o pain   Therapy/Group: Individual Therapy  Simonne Come 04/05/2020, 3:00 PM

## 2020-04-05 NOTE — Progress Notes (Addendum)
Dig Stim completed. Patient had large bowel movement that was loose and brown. Heel protectant boots in place while sleeping. Wound on bottom observed unstageable DTI as well as some breakdown below with redness. No other concerns to report at this time. Silvia Markuson, lpn

## 2020-04-06 ENCOUNTER — Inpatient Hospital Stay (HOSPITAL_COMMUNITY): Payer: Medicare Other | Admitting: Occupational Therapy

## 2020-04-06 ENCOUNTER — Inpatient Hospital Stay (HOSPITAL_COMMUNITY): Payer: Medicare Other

## 2020-04-06 LAB — CBC WITH DIFFERENTIAL/PLATELET
Abs Immature Granulocytes: 0.02 10*3/uL (ref 0.00–0.07)
Basophils Absolute: 0 10*3/uL (ref 0.0–0.1)
Basophils Relative: 1 %
Eosinophils Absolute: 0.1 10*3/uL (ref 0.0–0.5)
Eosinophils Relative: 2 %
HCT: 39.1 % (ref 39.0–52.0)
Hemoglobin: 12.3 g/dL — ABNORMAL LOW (ref 13.0–17.0)
Immature Granulocytes: 0 %
Lymphocytes Relative: 23 %
Lymphs Abs: 1 10*3/uL (ref 0.7–4.0)
MCH: 28.5 pg (ref 26.0–34.0)
MCHC: 31.5 g/dL (ref 30.0–36.0)
MCV: 90.7 fL (ref 80.0–100.0)
Monocytes Absolute: 0.3 10*3/uL (ref 0.1–1.0)
Monocytes Relative: 7 %
Neutro Abs: 3 10*3/uL (ref 1.7–7.7)
Neutrophils Relative %: 67 %
Platelets: 305 10*3/uL (ref 150–400)
RBC: 4.31 MIL/uL (ref 4.22–5.81)
RDW: 13.2 % (ref 11.5–15.5)
WBC: 4.6 10*3/uL (ref 4.0–10.5)
nRBC: 0 % (ref 0.0–0.2)

## 2020-04-06 LAB — BASIC METABOLIC PANEL
Anion gap: 7 (ref 5–15)
BUN: 19 mg/dL (ref 8–23)
CO2: 25 mmol/L (ref 22–32)
Calcium: 9.3 mg/dL (ref 8.9–10.3)
Chloride: 105 mmol/L (ref 98–111)
Creatinine, Ser: 0.64 mg/dL (ref 0.61–1.24)
GFR calc Af Amer: >60 mL/min (ref >60)
GFR calc non Af Amer: >60 mL/min (ref >60)
Glucose, Bld: 119 mg/dL — ABNORMAL HIGH (ref 70–99)
Potassium: 4 mmol/L (ref 3.5–5.1)
Sodium: 137 mmol/L (ref 135–145)

## 2020-04-06 LAB — GLUCOSE, CAPILLARY
Glucose-Capillary: 121 mg/dL — ABNORMAL HIGH (ref 70–99)
Glucose-Capillary: 128 mg/dL — ABNORMAL HIGH (ref 70–99)
Glucose-Capillary: 150 mg/dL — ABNORMAL HIGH (ref 70–99)
Glucose-Capillary: 146 mg/dL — ABNORMAL HIGH (ref 70–99)

## 2020-04-06 NOTE — Progress Notes (Signed)
[facility] PHYSICAL MEDICINE & REHABILITATION PROGRESS NOTE  Subjective/Complaints: Patient seen sitting up in bed this morning.  He states he slept well overnight.  He states he did incontinent bowel movement overnight and was not aware.  ROS: Denies CP, SOB, N/V/D  Objective: Vital Signs: Blood pressure (!) 125/53, pulse (!) 58, temperature 98.2 F (36.8 C), temperature source Oral, resp. rate 20, height 6' (1.829 m), weight 108.4 kg, SpO2 98 %. No results found. Recent Labs    04/06/20 0823  WBC 4.6  HGB 12.3*  HCT 39.1  PLT 305   No results for input(s): NA, K, CL, CO2, GLUCOSE, BUN, CREATININE, CALCIUM in the last 72 hours.  Physical Exam: BP (!) 125/53 (BP Location: Left Arm)   Pulse (!) 58   Temp 98.2 F (36.8 C) (Oral)   Resp 20   Ht 6' (1.829 m)   Wt 108.4 kg   SpO2 98%   BMI 32.41 kg/m  Constitutional: No distress . Vital signs reviewed. HENT: Normocephalic.  Atraumatic. Eyes: EOMI. No discharge. Cardiovascular: No JVD.  RRR. Respiratory: Normal effort.  No stridor.  Bilateral clear to auscultation. GI: Non-distended.  BS +. GU: + Foley. Skin: Sacral wound not examined today. Psych: Normal mood.  Normal behavior. Musc: No edema in extremities.  No tenderness in extremities. Neuro: Alert Aware of deficits Motor:  RUE: Shoulder abduction, elbow flexion 5/5, wrist extension 4+/5, elbow ext 4+/5, distally 0/5, stable  LUE: Shoulder abduction, elbow flexion 5/5, wrist extension 4+/5, elbow ext 4/5, hand grip 0/5, stable  B/l LE: 0/5 proxima to distal, no change  Assessment/Plan: 1. Functional deficits secondary to NMO/transverse myelitis which require 3+ hours per day of interdisciplinary therapy in a comprehensive inpatient rehab setting.  Physiatrist is providing close team supervision and 24 hour management of active medical problems listed below.  Physiatrist and rehab team continue to assess barriers to discharge/monitor patient progress toward  functional and medical goals  Care Tool:  Bathing    Body parts bathed by patient: Chest, Abdomen, Right arm, Left arm, Face   Body parts bathed by helper: Front perineal area, Buttocks, Right upper leg, Left upper leg, Right lower leg, Left lower leg     Bathing assist Assist Level: Moderate Assistance - Patient 50 - 74%     Upper Body Dressing/Undressing Upper body dressing Upper body dressing/undressing activity did not occur (including orthotics): N/A What is the patient wearing?: Pull over shirt    Upper body assist Assist Level: Maximal Assistance - Patient 25 - 49%    Lower Body Dressing/Undressing Lower body dressing      What is the patient wearing?: Pants, Incontinence brief     Lower body assist Assist for lower body dressing: Dependent - Patient 0%     Toileting Toileting    Toileting assist Assist for toileting: Dependent - Patient 0%     Transfers Chair/bed transfer  Transfers assist     Chair/bed transfer assist level: Dependent - mechanical lift     Locomotion Ambulation   Ambulation assist   Ambulation activity did not occur: Safety/medical concerns (quadriplegia, decreased balance/postural control)          Walk 10 feet activity   Assist  Walk 10 feet activity did not occur: Safety/medical concerns (quadriplegia, decreased balance/postural control)        Walk 50 feet activity   Assist Walk 50 feet with 2 turns activity did not occur: Safety/medical concerns (quadriplegia, decreased balance/postural control)  Walk 150 feet activity   Assist Walk 150 feet activity did not occur: Safety/medical concerns (quadriplegia, decreased balance/postural control)         Walk 10 feet on uneven surface  activity   Assist Walk 10 feet on uneven surfaces activity did not occur: Safety/medical concerns (quadriplegia, decreased balance/postural control)         Wheelchair     Assist Will patient use wheelchair  at discharge?: Yes Type of Wheelchair: Power    Wheelchair assist level: Supervision/Verbal cueing Max wheelchair distance: 300    Wheelchair 50 feet with 2 turns activity    Assist        Assist Level: Supervision/Verbal cueing   Wheelchair 150 feet activity     Assist     Assist Level: Supervision/Verbal cueing      Medical Problem List and Plan: 1.  Quadriparesis secondary to transverse myelitis/NMO.  IV IG completed 03/20/2020 as well as received Rituxan dose 03/24/2020, plan for repeat dose ~7/20.  Continue CIR  B/L PRAFO nightly 2.  Antithrombotics: -DVT/anticoagulation: Lovenox                Vascular study negative for DVT             -antiplatelet therapy: Aspirin 81 mg daily 3. Pain Management: Lidoderm patch, tramadol as needed  Controlled on 7/19  Monitor with increased exertion 4. Mood: Zoloft 150 mg nightly, Ativan as needed             -antipsychotic agents: N/A 5. Neuropsych: This patient is capable of making decisions on his own behalf. 6. Skin/Wound Care: Routine skin checks  Air mattress ordered 7. Fluids/Electrolytes/Nutrition: Routine in and outs. CMP ordered 8.  Diabetes mellitus type 2 with hyperglycemia.  Hemoglobin A1c 6.7.    On Jardiance 12.5 mg daily, Metformin 1000 mg twice daily, Lantus 30 units, NovoLog 25-30 units 3 times daily PTA  Lantus insulin 30 units daily  Metformin 500 twice daily started on 7/13 on hold at present  Jardiance 10 started on 7/16  7/18 cbg's controlled. Will hold metformin given #10 9. HCAP/OSA.  Completed course of Maxipime initiated 03/18/2020 x14 doses.  Continue BiPAP 10. Hyperlipidemia. Lipitor/Vascepa 11. Hypertension.  Lisinopril 20 mg daily  Controlled on 7/19  Monitor with increased mobility 12.  Transaminitis  LFTs elevated, but improving on 7/16  Continue to monitor 13.  Leukopenia-likely medication induced  WBCs 4.6 on 7/19  Discussed with pharmacy - [place] [facility]  Continue to monitor 14.  Acute  blood loss anemia  Hemoglobin 12.3 on 7/19  Continue to monitor 10.  Neurogenic bowel/bladder  Indwelling Foley due to traumatic I/O caths  Bowel program:    -miralax stopped Friday   -added probiotic 7/17   -PM suppository DC'd on 7/19   -added metamucil    Continue digital stimulation     LOS: 12 days A FACE TO FACE EVALUATION WAS PERFORMED  Bela Bonaparte Lorie Phenix 04/06/2020, 8:56 AM

## 2020-04-06 NOTE — Progress Notes (Addendum)
Patient ID: Brent [name]., male   DOB: 20-Aug-1946, 74 y.o.   MRN: [id number]   Spouse called sw overwhelmed about patient's discharge plans. Sw provided a brief update. Goal is for patient to return home, informed wife all recommendations will be made closer to discharge and we'll be able to work through them.  Sw will begin sending spouse resources for [facility], [facility], SNF.

## 2020-04-06 NOTE — Progress Notes (Signed)
Physical Therapy Session Note  Patient Details  Name: Brent [name]. MRN: [id number] Date of Birth: [birth year]  Today's Date: 04/06/2020 PT Individual Time: 9937-1696 PT Individual Time Calculation (min): 60 min   Short Term Goals: Week 1:  PT Short Term Goal 1 (Week 1): Pt will roll L and R with mod A and use of bed features PT Short Term Goal 1 - Progress (Week 1): Progressing toward goal PT Short Term Goal 2 (Week 1): Pt will transfer supine<>sitting EOB with max A of 1 PT Short Term Goal 2 - Progress (Week 1): Progressing toward goal PT Short Term Goal 3 (Week 1): Pt will transfer bed<>WC with LRAD max A of 1 PT Short Term Goal 3 - Progress (Week 1): Progressing toward goal Week 2:  PT Short Term Goal 1 (Week 2): Pt will perform rolling L/R with mod A x 1 PT Short Term Goal 2 (Week 2): Pt will initiate power wheelchair mobility PT Short Term Goal 3 (Week 2): Pt will maintain static sitting balance x 5 min with CGA  Skilled Therapeutic Interventions/Progress Updates:   Received pt supine in bed with RN present administering medications, pt agreeable to therapy, and denied any pain during session but continues to report discomfort in L upper trapezius. Session with emphasis on functional mobility/transfers, dressing, generalized strengthening, and improved activity tolerance. Doffed PRAFO boots total A and donned bilateral ted hose and shoes total A. Pt rolled to L and R with max A and required +2 assist to don shorts. Pt rolled to L and R again and required +2 assist to don Maximove sling in supine. Pt transferred bed<>power WC via Maximove dependently and required max A +2 to reposition hips in power WC. Donned gait belt around bilateral knees to prevent hips from sliding into abduction while sitting. Pt performed WC mobility 17ft x 2 trials with supervision, increased time, and verbal cues in power WC to/from ortho gym. Pt required min cues for joystick features but reported feeling  better navigating it with practice. Worked on dynamic sitting balance, trunk control, UE strength, and cardiovascular endurance on UBE at level 0 for for 1 minute and 20 seconds. Pt with difficulty maintaining trunk control and frequently tilting laterally requiring min/mod A to correct. Pt also with increased difficulty gripping handgrips. Pt reported feeling fatigued at end of session. Concluded session with pt semi-reclined in power WC, needs within reach, and seatbelt alarm on. Therapist provided fresh drink for pt.   Therapy Documentation Precautions:  Precautions Precautions: Fall Precaution Comments: quadriplegia Restrictions Weight Bearing Restrictions: No  Therapy/Group: Individual Therapy Alfonse Alpers PT, DPT   04/06/2020, 7:24 AM

## 2020-04-06 NOTE — Progress Notes (Signed)
Occupational Therapy Session Note  Patient Details  Name: Brent [name]. MRN: [id number] Date of Birth: [birth year]  Today's Date: 04/06/2020 OT Individual Time: 1047-1200 OT Individual Time Calculation (min): 73 min    Short Term Goals: Week 2:  OT Short Term Goal 1 (Week 2): Pt will complete LB bathing at bed level, utilizing circle sitting/figure 4 and AE as needed, with mod assist OT Short Term Goal 2 (Week 2): Pt will don pants at bed level with max assist, utilizing circle sitting/figure 4 and AE as needed. OT Short Term Goal 3 (Week 2): Pt will maintain upright sitting position with CGA for 2 mins to demonstrate increased postural control as needed for UB dressing OT Short Term Goal 4 (Week 2): Pt will monitor and direct weight shifts when OOB with min cues  Skilled Therapeutic Interventions/Progress Updates:    Treatment session with focus on functional use of BUE during grooming and therapeutic activities as well as management of power w/c for mobility and pressure relief.  Pt received upright in power w/c reporting getting dressed during PT therapy session.  Pt expressing desire to complete oral care.  Pt managed power w/c throughout session for all mobility with min cues for sequencing/problem solving.  Pt able to reach outside BOS with min-mod assist for trunk control to obtain toothbrush.  Educated on various types of toothpaste dispensers to increase independence with applying toothpaste to toothbrush.  Pt brushed teeth with use of BUE together.  Engaged in repositioning in w/c with +2 to facilitate weight shift to improve postural control.  Engaged in table top activity with focus on increased trunk control with reaching outside BOS and use of gross grasp to manipulate various items from large lego blocks, pipe tree, and cups.  Discussed current level of impairment as pt asking about why he could reach for items but has so much difficulty with grasp and pinch.  Educated on use of  tenodesis to increase functional grasp when appropriate.  Encouraged use of power w/c for pressure relief during session while educating on rationale.  Pt returned to room with min cues for management of power w/c as pt veered the direction he was looking.  Pt remained upright in power w/c with call bell in reach and seat belt alarm on.  Therapy Documentation Precautions:  Precautions Precautions: Fall Precaution Comments: quadriplegia Restrictions Weight Bearing Restrictions: No Pain:  Pt with no c/o pain   Therapy/Group: Individual Therapy  Simonne Come 04/06/2020, 12:10 PM

## 2020-04-06 NOTE — Progress Notes (Addendum)
Patient ID: Brent Franchi., male   DOB: [birth year], 74 y.o.   MRN: [id number]   Sw followed up with patient to see if there was any questions or concerns. Patient on phone with spouse. Patient concerned about home recommendation. Sw informed him and spouse the team would update them on recommendations closer to discharge.

## 2020-04-06 NOTE — Plan of Care (Signed)
  Problem: SCI BOWEL ELIMINATION Goal: RH STG SCI MANAGE BOWEL PROGRAM W/ASSIST OR AS APPROPRIATE Description: STG SCI Manage bowel program w/assist or as appropriate. Max assist Outcome: Progressing Flowsheets (Taken 04/06/2020 1444) STG: SCI Pt will manage bowels with assistance or as appropriate: Total assist   Problem: SCI BLADDER ELIMINATION Goal: RH STG SCI MANAGE BLADDER PROGRAM W/ASSISTANCE Description: Manage bladder with max assist Outcome: Progressing Flowsheets (Taken 04/06/2020 1444) STG: SCI Pt will manage bladder with assistance or as appropriate: 1-Total assistance   Problem: RH SKIN INTEGRITY Goal: RH STG SKIN FREE OF INFECTION/BREAKDOWN Description: Prevent skin breakdown with max assist Outcome: Progressing Goal: RH STG ABLE TO PERFORM INCISION/WOUND CARE W/ASSISTANCE Description: STG Able To Perform Incision/Wound Care With Assistance. Manage peri area redness with mod assist Outcome: Progressing Flowsheets (Taken 04/06/2020 1444) STG: Pt will be able to perform incision/wound care with assistance: 1-Total assistance   Problem: RH PAIN MANAGEMENT Goal: RH STG PAIN MANAGED AT OR BELOW PT'S PAIN GOAL Description: Less than 4 Outcome: Progressing   Problem: Consults Goal: RH SPINAL CORD INJURY PATIENT EDUCATION Description:  See Patient Education module for education specifics.  Outcome: Progressing

## 2020-04-06 NOTE — Progress Notes (Signed)
Occupational Therapy Session Note  Patient Details  Name: Brent [name]. MRN: [id number] Date of Birth: 05-Aug-1946  Today's Date: 04/06/2020 OT Individual Time: 1403-1505 OT Individual Time Calculation (min): 62 min    Short Term Goals: Week 2:  OT Short Term Goal 1 (Week 2): Pt will complete LB bathing at bed level, utilizing circle sitting/figure 4 and AE as needed, with mod assist OT Short Term Goal 2 (Week 2): Pt will don pants at bed level with max assist, utilizing circle sitting/figure 4 and AE as needed. OT Short Term Goal 3 (Week 2): Pt will maintain upright sitting position with CGA for 2 mins to demonstrate increased postural control as needed for UB dressing OT Short Term Goal 4 (Week 2): Pt will monitor and direct weight shifts when OOB with min cues  Skilled Therapeutic Interventions/Progress Updates:    Treatment session with focus on static and dynamic sitting balance.  Pt received upright in w/c.  Setup with built up u-cuff to allow pt to eat ice cream.  Pt able to use AE after setup without additional assistance to complete self-feeding. Utilized maximove for transfers throughout session due to wound on buttocks.  Engaged in static and dynamic sitting balance with min-mod assist for sitting balance due to decreased trunk control and inability to grade movements during reaching activity.  Pt able to maintain propped on hands sitting with CGA, but required increased assist with dynamic sitting.  Engaged in chest presses and horizontal "twists" with dowel with focus on unsupported sitting.  Therapist providing support on dowel to maintain grasp while 2nd person provided min with lateral reaching and mod with chest presses.  Pt reports dizziness in sitting.  BP 102/39 in sitting, increased to 129/47 once returned to bed.  Pt remained supine in bed with head elevated and all needs in reach.  Therapy Documentation Precautions:  Precautions Precautions: Fall Precaution  Comments: quadriplegia Restrictions Weight Bearing Restrictions: No Pain:  Pt with c/o pain in Lt trap/neck.  Repositioned.   Therapy/Group: Individual Therapy  Simonne Come 04/06/2020, 3:29 PM

## 2020-04-07 ENCOUNTER — Inpatient Hospital Stay (HOSPITAL_COMMUNITY): Payer: Medicare Other | Admitting: Occupational Therapy

## 2020-04-07 ENCOUNTER — Inpatient Hospital Stay (HOSPITAL_COMMUNITY): Payer: Medicare Other

## 2020-04-07 DIAGNOSIS — G8250 Quadriplegia, unspecified: Secondary | ICD-10-CM

## 2020-04-07 LAB — CBC WITH DIFFERENTIAL/PLATELET
Abs Immature Granulocytes: 0.02 10*3/uL (ref 0.00–0.07)
Basophils Absolute: 0 10*3/uL (ref 0.0–0.1)
Basophils Relative: 1 %
Eosinophils Absolute: 0.1 10*3/uL (ref 0.0–0.5)
Eosinophils Relative: 2 %
HCT: 36.6 % — ABNORMAL LOW (ref 39.0–52.0)
Hemoglobin: 11.6 g/dL — ABNORMAL LOW (ref 13.0–17.0)
Immature Granulocytes: 1 %
Lymphocytes Relative: 23 %
Lymphs Abs: 0.8 10*3/uL (ref 0.7–4.0)
MCH: 28.1 pg (ref 26.0–34.0)
MCHC: 31.7 g/dL (ref 30.0–36.0)
MCV: 88.6 fL (ref 80.0–100.0)
Monocytes Absolute: 0.3 10*3/uL (ref 0.1–1.0)
Monocytes Relative: 9 %
Neutro Abs: 2.3 10*3/uL (ref 1.7–7.7)
Neutrophils Relative %: 64 %
Platelets: 280 10*3/uL (ref 150–400)
RBC: 4.13 MIL/uL — ABNORMAL LOW (ref 4.22–5.81)
RDW: 13.1 % (ref 11.5–15.5)
WBC: 3.5 10*3/uL — ABNORMAL LOW (ref 4.0–10.5)
nRBC: 0 % (ref 0.0–0.2)

## 2020-04-07 LAB — GLUCOSE, CAPILLARY
Glucose-Capillary: 165 mg/dL — ABNORMAL HIGH (ref 70–99)
Glucose-Capillary: 135 mg/dL — ABNORMAL HIGH (ref 70–99)
Glucose-Capillary: 147 mg/dL — ABNORMAL HIGH (ref 70–99)

## 2020-04-07 MED ORDER — FAMOTIDINE IN NACL 20-0.9 MG/50ML-% IV SOLN
20.0000 mg | Freq: Once | INTRAVENOUS | Status: DC | PRN
  Filled 2020-04-07: qty 50

## 2020-04-07 MED ORDER — SODIUM CHLORIDE 0.9 % IV BOLUS: 1000.0000 mL | Freq: Once | INTRAVENOUS | Status: DC | PRN

## 2020-04-07 MED ORDER — ACETAMINOPHEN 325 MG PO TABS
650.0000 mg | ORAL_TABLET | Freq: Once | ORAL | Status: AC
  Administered 2020-04-07: 650 mg via ORAL
  Filled 2020-04-07: qty 2

## 2020-04-07 MED ORDER — METHYLPREDNISOLONE SODIUM SUCC 125 MG IJ SOLR: 125.0000 mg | Freq: Once | INTRAMUSCULAR | Status: DC | PRN

## 2020-04-07 MED ORDER — EPINEPHRINE PF 1 MG/ML IJ SOLN: 0.3000 mg | INTRAMUSCULAR | Status: DC | PRN

## 2020-04-07 MED ORDER — DIPHENHYDRAMINE HCL 50 MG/ML IJ SOLN: 50.0000 mg | Freq: Once | INTRAMUSCULAR | Status: DC | PRN

## 2020-04-07 MED ORDER — SODIUM CHLORIDE 0.9 % IV SOLN
1000.0000 mg | Freq: Once | INTRAVENOUS | Status: AC
  Administered 2020-04-07: 1000 mg via INTRAVENOUS
  Filled 2020-04-07: qty 100

## 2020-04-07 MED ORDER — DIPHENHYDRAMINE HCL 25 MG PO CAPS
50.0000 mg | ORAL_CAPSULE | Freq: Once | ORAL | Status: AC
  Administered 2020-04-07: 50 mg via ORAL
  Filled 2020-04-07: qty 2

## 2020-04-07 NOTE — Progress Notes (Signed)
Occupational Therapy Session Note  Patient Details  Name: Brent [name]. MRN: [id number] Date of Birth: November 28, 1945  Today's Date: 04/07/2020 OT Individual Time: 1105-1200 OT Individual Time Calculation (min): 55 min    Short Term Goals: Week 2:  OT Short Term Goal 1 (Week 2): Pt will complete LB bathing at bed level, utilizing circle sitting/figure 4 and AE as needed, with mod assist OT Short Term Goal 2 (Week 2): Pt will don pants at bed level with max assist, utilizing circle sitting/figure 4 and AE as needed. OT Short Term Goal 3 (Week 2): Pt will maintain upright sitting position with CGA for 2 mins to demonstrate increased postural control as needed for UB dressing OT Short Term Goal 4 (Week 2): Pt will monitor and direct weight shifts when OOB with min cues  Skilled Therapeutic Interventions/Progress Updates:    Upon entering the room, pt seated in power chair awaiting OT arrival. Pt with no c/o pain this session and agreeable to OT intervention. Pt does report, " I have to tilt chair way back for my butt (pressure relief) every 30 minutes." Pt demonstrating ability to drive power chair down hallways, back up into a doorway, and perform 3 point turn in tight space with increased time and safely. Pt returning back to room. OT assisted pt into maxi move sling and second helper present to transfer pt via lift back to bed. OT assisted pt into circle sitting and then figure four position to attempt to doff shoes. Pt was unable to grip laces but was able to push with hand to slide shoe from heel and off foot. Pt rolling L <> R with min A and use of bed rails in order to remove sling and to check brief for skin integrity which he was still dry. Pt engaged in pinch task with thumb and index finger to pick up resistive cubes from lap and place into cup. Pt unable to release cubes from L hand. Pt unable to use thumb and any other digits to pick up and not able to close either hand for gross grasp  to pick up cubes. Pt remained in bed at end of session and utilized B UEs to bring cup to mouth at end of session. Call bell and all needed items within reach upon exiting the room.   Therapy Documentation Precautions:  Precautions Precautions: Fall Precaution Comments: quadriplegia Restrictions Weight Bearing Restrictions: No    ADL: ADL Eating: Maximal assistance Where Assessed-Eating: Bed level Grooming: Maximal assistance Where Assessed-Grooming: Bed level Upper Body Bathing: Maximal assistance Where Assessed-Upper Body Bathing: Bed level Lower Body Bathing: Dependent Where Assessed-Lower Body Bathing: Bed level Upper Body Dressing: Maximal assistance Where Assessed-Upper Body Dressing: Bed level Lower Body Dressing: Dependent Where Assessed-Lower Body Dressing: Bed level Toileting: Dependent Where Assessed-Toileting: Bed level   Therapy/Group: Individual Therapy  Gypsy Decant 04/07/2020, 12:19 PM

## 2020-04-07 NOTE — Progress Notes (Signed)
Physical Therapy Session Note  Patient Details  Name: Brent [name]. MRN: [id number] Date of Birth: 06/18/1946  Today's Date: 04/07/2020 PT Individual Time: 1300-1356 PT Individual Time Calculation (min): 56 min   Short Term Goals: Week 1:  PT Short Term Goal 1 (Week 1): Pt will roll L and R with mod A and use of bed features PT Short Term Goal 1 - Progress (Week 1): Progressing toward goal PT Short Term Goal 2 (Week 1): Pt will transfer supine<>sitting EOB with max A of 1 PT Short Term Goal 2 - Progress (Week 1): Progressing toward goal PT Short Term Goal 3 (Week 1): Pt will transfer bed<>WC with LRAD max A of 1 PT Short Term Goal 3 - Progress (Week 1): Progressing toward goal Week 2:  PT Short Term Goal 1 (Week 2): Pt will perform rolling L/R with mod A x 1 PT Short Term Goal 2 (Week 2): Pt will initiate power wheelchair mobility PT Short Term Goal 3 (Week 2): Pt will maintain static sitting balance x 5 min with CGA  Skilled Therapeutic Interventions/Progress Updates:  Session focused on NMR on tilt table for weightbearing through BLE, upright tolerance, improved bowel/bladder/respiratory function, and UE and trunk muscle activation. Educated patient on benefits and pt verbalized understanding and agreeable. Min to mod assist for rolling with use bedrails for support to don maxislide under patient for lateral transfer. +2 total assist for lateral transfer bed <> tilt table.  Supine: 114/65mmHg; Hr = 57 bpm 40 degrees 14min 30 sec total: 77/37 mmHg; HR = 62 bpm asymptomatic 40 degrees 5 min: 70/42 mmHg; HR = 61 bpm asymptomatic 40 degrees 7 min 45 sec total: 78/50 mmHg; HR = 56 bpm asymptomatic 20 degrees 10 min total: 83/48 mmHg; HR = 57 bpm asymptomatic 20 degrees 14 min total: 98/37mmHg; HR = 55 bpm asymptomatic 30 degrees 17 min total: 99/14mmHg; HR = 57 bpm asymptomatic 40 degrees 22 min total: 90/13mmHg; HR = 60 bpm asymptomatic  Total time of 26 min on tilt table. Engaged  in [place] and strengthening exercises for functional strengthening while on tilt table and provided overall education in regards to upright tolerance, progression of activity, and goals. Left in bed at end of session to be hooked up to IV for medication.   Therapy Documentation Precautions:  Precautions Precautions: Fall Precaution Comments: quadriplegia Restrictions Weight Bearing Restrictions: No  Pain:  Denies pain.   Therapy/Group: Individual Therapy  [name], PT, DPT, CBIS  04/07/2020, 3:20 PM

## 2020-04-07 NOTE — Progress Notes (Signed)
Brent [name] PHYSICAL MEDICINE & REHABILITATION PROGRESS NOTE  Subjective/Complaints: Patient seen sitting up in bed this morning.  He states he slept well overnight.  He is working with therapies.  Discussed wound and wound care with therapies and nursing, including pressure relief.  Patient notes bowel movement after dig stim yesterday, without incontinent episode thereafter.  ROS: Denies CP, SOB, N/V/D  Objective: Vital Signs: Blood pressure (!) 112/53, pulse (!) 55, temperature 98.1 F (36.7 C), resp. rate 17, height 6' (1.829 m), weight 108.4 kg, SpO2 98 %. No results found. Recent Labs    04/06/20 0823 04/07/20 0524  WBC 4.6 3.5*  HGB 12.3* 11.6*  HCT 39.1 36.6*  PLT 305 280   Recent Labs    04/06/20 0738  NA 137  K 4.0  CL 105  CO2 25  GLUCOSE 119*  BUN 19  CREATININE 0.64  CALCIUM 9.3    Physical Exam: BP (!) 112/53 (BP Location: Right Arm)   Pulse (!) 55   Temp 98.1 F (36.7 C)   Resp 17   Ht 6' (1.829 m)   Wt 108.4 kg   SpO2 98%   BMI 32.41 kg/m  Constitutional: No distress . Vital signs reviewed. HENT: Normocephalic.  Atraumatic. Eyes: EOMI. No discharge. Cardiovascular: No JVD.  RRR. Respiratory: Normal effort.  No stridor.  Bilaterally clear to auscultation. GI: Non-distended.  BS +. GU: + Foley. Skin: Sacral DTI Psych: Normal mood.  Normal behavior. Musc: No edema in extremities.  No tenderness in extremities. Neuro: Alert Aware of deficits Motor:  RUE: Shoulder abduction, elbow flexion 5/5, wrist extension 4+/5, elbow ext 4+/5, distally 0/5, unchanged LUE: Shoulder abduction, elbow flexion 5/5, wrist extension 4+/5, elbow ext 4/5, hand grip 0/5, unchanged B/l LE: 0/5 proxima to distal, unchanged Sensation intact light touch  Assessment/Plan: 1. Functional deficits secondary to NMO/transverse myelitis which require 3+ hours per day of interdisciplinary therapy in a comprehensive inpatient rehab setting.  Physiatrist is providing close team  supervision and 24 hour management of active medical problems listed below.  Physiatrist and rehab team continue to assess barriers to discharge/monitor patient progress toward functional and medical goals  Care Tool:  Bathing    Body parts bathed by patient: Chest, Abdomen, Right arm, Left arm, Face   Body parts bathed by helper: Front perineal area, Buttocks, Right upper leg, Left upper leg, Right lower leg, Left lower leg     Bathing assist Assist Level: Moderate Assistance - Patient 50 - 74%     Upper Body Dressing/Undressing Upper body dressing Upper body dressing/undressing activity did not occur (including orthotics): N/A What is the patient wearing?: Pull over shirt    Upper body assist Assist Level: Maximal Assistance - Patient 25 - 49%    Lower Body Dressing/Undressing Lower body dressing      What is the patient wearing?: Pants, Incontinence brief     Lower body assist Assist for lower body dressing: Dependent - Patient 0%     Toileting Toileting    Toileting assist Assist for toileting: Dependent - Patient 0%     Transfers Chair/bed transfer  Transfers assist     Chair/bed transfer assist level: Dependent - mechanical lift     Locomotion Ambulation   Ambulation assist   Ambulation activity did not occur: Safety/medical concerns (quadriplegia, decreased balance/postural control)          Walk 10 feet activity   Assist  Walk 10 feet activity did not occur: Safety/medical concerns (quadriplegia, decreased balance/postural  control)        Walk 50 feet activity   Assist Walk 50 feet with 2 turns activity did not occur: Safety/medical concerns (quadriplegia, decreased balance/postural control)         Walk 150 feet activity   Assist Walk 150 feet activity did not occur: Safety/medical concerns (quadriplegia, decreased balance/postural control)         Walk 10 feet on uneven surface  activity   Assist Walk 10 feet on  uneven surfaces activity did not occur: Safety/medical concerns (quadriplegia, decreased balance/postural control)         Wheelchair     Assist Will patient use wheelchair at discharge?: Yes Type of Wheelchair: Power    Wheelchair assist level: Supervision/Verbal cueing Max wheelchair distance: 300    Wheelchair 50 feet with 2 turns activity    Assist        Assist Level: Supervision/Verbal cueing   Wheelchair 150 feet activity     Assist     Assist Level: Supervision/Verbal cueing      Medical Problem List and Plan: 1.  Quadriparesis secondary to transverse myelitis/NMO.  IV IG completed 03/20/2020 as well as received Rituxan dose 03/24/2020, repeat infusion ordered for today after therapies, will monitor closely during and after infusion-discussed with pharmacy..  Continue CIR  B/L PRAFO nightly 2.  Antithrombotics: -DVT/anticoagulation: Lovenox                Vascular study negative for DVT             -antiplatelet therapy: Aspirin 81 mg daily 3. Pain Management: Lidoderm patch, tramadol as needed  Controlled on 7/20  Monitor with increased exertion 4. Mood: Zoloft 150 mg nightly, Ativan as needed             -antipsychotic agents: N/A 5. Neuropsych: This patient is capable of making decisions on his own behalf. 6. Skin/Wound Care: Routine skin checks  Air mattress ordered  Continue to encourage pressure-relief 7. Fluids/Electrolytes/Nutrition: Routine in and outs. CMP ordered 8.  Diabetes mellitus type 2 with hyperglycemia.  Hemoglobin A1c 6.7.    On Jardiance 12.5 mg daily, Metformin 1000 mg twice daily, Lantus 30 units, NovoLog 25-30 units 3 times daily PTA  Lantus insulin 30 units daily  Metformin 500 twice daily started on 7/13 on hold at present  Jardiance 10 started on 7/16  Relatively controlled, no CBG this morning 9. HCAP/OSA.  Completed course of Maxipime initiated 03/18/2020 x14 doses.  Continue BiPAP 10. Hyperlipidemia.  Lipitor/Vascepa 11. Hypertension.  Lisinopril 20 mg daily  Controlled on 7/20  Monitor with increased mobility 12.  Transaminitis  LFTs elevated, but improving on 7/16  Continue to monitor 13.  Leukopenia-likely medication induced  WBCs 3.5 on 7/20  Discussed with pharmacy - [place] WNL  Continue to monitor 14.  Acute blood loss anemia  Hemoglobin 11.6 on 7/20  Continue to monitor 10.  Neurogenic bowel/bladder  Indwelling Foley due to traumatic I/O caths  Bowel program:    -miralax stopped   -added probiotic 7/17   -PM suppository DC'd on 7/19   -added metamucil    Continue digital stimulation   Improving    LOS: 13 days A FACE TO FACE EVALUATION WAS PERFORMED  Ambriella Kitt Lorie Phenix 04/07/2020, 8:50 AM

## 2020-04-07 NOTE — Progress Notes (Signed)
Pt had medium sized bowel movement. Dig stim completed, peri and foley care completed. Pt tolerated well. Perineum still red. DTI on bottom has opened in the middle and has some serosanguinous drainage, color is black and edges pink with some red. Peri wound is pink with some red. Skin tear has some red and pink with some slough. No other concerns to report. Andreia Gandolfi, lpn

## 2020-04-07 NOTE — Progress Notes (Signed)
Request from RN to check on Rituxan infusion, nurse concerned bag isn't emptying. Infusion at max rate, IV site unremarkable. Primary bag lowered further to ensure saline isn't interfering with drip rate. Discussed with pt and wife, both voiced understanding.

## 2020-04-07 NOTE — Progress Notes (Signed)
DTI to sacral area is red, yellow and black. Some areas open. Dressing changed and wound cleansed. Minimal serosanguinous drainage, malodorous.

## 2020-04-07 NOTE — Progress Notes (Signed)
Rituxan administration planned for 7/20. Coordinated with pharmacy and RN to plan for 1400.

## 2020-04-07 NOTE — Progress Notes (Signed)
Pt had large bowel movement, dig stim completed. Peri care and foley care completed. Dressing on bottom changed. Heel protectant boots applied at bedtime. Prefor boots applied in the morning. No other concerns to report.

## 2020-04-07 NOTE — Consult Note (Addendum)
Neurology Consultation  Reason for Consult: Use of Rituxan with leukopenia Referring Physician: Dr. Posey Pronto  CC: Use of Rituxan with leukopenia  History is obtained from: Chart  HPI: Brent [name]. is a 74 y.o. male with longitudinally extensive transverse myelitis, NMO antibodies positive.  Patient now is an inpatient rehab.  Neurology was asked to evaluate patient and make recommendations on further use of Rituxan given he is leukopenia at this point time.    Past Medical History:  Diagnosis Date  . Allergy   . CAD (coronary artery disease)    a. angioplasty of his RCA in 1990. b. bare metal stent placed in the RCA in 2000 followed by rotational atherectomy shortly after for stent restenosis. c. last cath was in 2012 showing stable moderate diffuse CAD. (70% mid LAD, 80% diagonal, 70% Ramus, 40% mid to distal RCA stent restenosis). d. Low risk nuc in 2015.  . Diabetes mellitus   . Diverticulosis   . Elevated CK   . Erectile dysfunction   . Hemorrhoids   . HTN (hypertension)   . Hyperlipidemia   . Hypertriglyceridemia   . Malignant melanoma of left side of neck ([place]) 10/25/2018  . Myocardial infarction ([place])   . Obesity   . OSA (obstructive sleep apnea)   . Persistent disorder of initiating or maintaining sleep   . Personal history of colonic polyps 02/05/2003    Family History  Problem Relation Age of Onset  . Liver cancer Father   . Lung cancer Father   . Heart disease Father   . Heart disease Sister   . Lung cancer Mother   . COPD Mother   . Obesity Mother   . Colon cancer Neg Hx   . Stomach cancer Neg Hx   . Pancreatic cancer Neg Hx    Social History:   reports that he quit smoking about 39 years ago. His smoking use included cigarettes. He has a 45.00 pack-year smoking history. He has never used smokeless tobacco. He reports current alcohol use of about 7.0 - 14.0 standard drinks of alcohol per week. He reports that he does not use  drugs.  Medications  Current Facility-Administered Medications:  .  acetaminophen (TYLENOL) tablet 650 mg, 650 mg, Oral, Q6H PRN, Jamse Arn, MD, 650 mg at 03/28/20 1102 .  albuterol (PROVENTIL) (2.5 MG/3ML) 0.083% nebulizer solution 2.5 mg, 2.5 mg, Nebulization, Once PRN, Angiulli, Lavon Paganini, PA-C .  aspirin EC tablet 81 mg, 81 mg, Oral, QHS, Cathlyn Parsons, PA-C, 81 mg at 04/06/20 2024 .  atorvastatin (LIPITOR) tablet 20 mg, 20 mg, Oral, Daily, Angiulli, Lavon Paganini, PA-C, 20 mg at 04/07/20 1015 .  Chlorhexidine Gluconate Cloth 2 % PADS 6 each, 6 each, Topical, Q0600, Jamse Arn, MD, 6 each at 04/07/20 0700 .  diphenhydrAMINE (BENADRYL) injection 50 mg, 50 mg, Intravenous, Once PRN, Jamse Arn, MD .  empagliflozin (JARDIANCE) tablet 10 mg, 10 mg, Oral, Daily, Posey Pronto, Domenick Bookbinder, MD, 10 mg at 04/07/20 1016 .  enoxaparin (LOVENOX) injection 50 mg, 50 mg, Subcutaneous, Q24H, Angiulli, Lavon Paganini, PA-C, 50 mg at 04/07/20 1017 .  EPINEPHrine (ADRENALIN) 0.3 mg, 0.3 mg, Intramuscular, Q5 min PRN, Posey Pronto, Domenick Bookbinder, MD .  famotidine (PEPCID) IVPB 20 mg premix, 20 mg, Intravenous, Once PRN, Posey Pronto, Domenick Bookbinder, MD .  fenofibrate tablet 160 mg, 160 mg, Oral, QHS, Angiulli, Lavon Paganini, PA-C, 160 mg at 04/06/20 2025 .  fluticasone (FLONASE) 50 MCG/ACT nasal spray 2 spray, 2 spray, Each  Nare, BID, Meredith Staggers, MD, 2 spray at 04/06/20 2025 .  icosapent Ethyl (VASCEPA) 1 g capsule 2 g, 2 g, Oral, BID, Angiulli, Lavon Paganini, PA-C, 2 g at 04/07/20 1017 .  insulin aspart (novoLOG) injection 0-20 Units, 0-20 Units, Subcutaneous, TID WC, Angiulli, Lavon Paganini, PA-C, 4 Units at 04/07/20 1233 .  insulin glargine (LANTUS) injection 30 Units, 30 Units, Subcutaneous, Daily, Cathlyn Parsons, PA-C, 30 Units at 04/07/20 1016 .  lidocaine (LIDODERM) 5 % 1 patch, 1 patch, Transdermal, Q24H, Angiulli, Lavon Paganini, PA-C, 1 patch at 04/06/20 1714 .  lisinopril (ZESTRIL) tablet 20 mg, 20 mg, Oral, Daily,  Angiulli, Lavon Paganini, PA-C, 20 mg at 04/07/20 1015 .  loperamide (IMODIUM) capsule 2 mg, 2 mg, Oral, PRN, Angiulli, Lavon Paganini, PA-C, 2 mg at 03/31/20 1045 .  LORazepam (ATIVAN) tablet 0.5 mg, 0.5 mg, Oral, Q4H PRN, Angiulli, Lavon Paganini, PA-C .  LORazepam (ATIVAN) tablet 1 mg, 1 mg, Oral, QHS PRN, Angiulli, Lavon Paganini, PA-C, 1 mg at 04/06/20 2025 .  methylPREDNISolone sodium succinate (SOLU-MEDROL) 125 mg/2 mL injection 125 mg, 125 mg, Intravenous, Once PRN, Jamse Arn, MD .  multivitamin with minerals tablet 1 tablet, 1 tablet, Oral, Daily, Angiulli, Lavon Paganini, PA-C, 1 tablet at 04/07/20 1014 .  nystatin (MYCOSTATIN/NYSTOP) topical powder 1 application, 1 application, Topical, TID, Angiulli, Lavon Paganini, PA-C, 1 application at 16/10/96 2026 .  oxybutynin (DITROPAN) tablet 5 mg, 5 mg, Oral, BID, Angiulli, Lavon Paganini, PA-C, 5 mg at 04/07/20 1018 .  pantoprazole (PROTONIX) EC tablet 40 mg, 40 mg, Oral, Daily, Angiulli, Lavon Paganini, PA-C, 40 mg at 04/07/20 1016 .  psyllium (HYDROCIL/METAMUCIL) packet 1 packet, 1 packet, Oral, Daily, Meredith Staggers, MD, 1 packet at 04/07/20 1017 .  saccharomyces boulardii (FLORASTOR) capsule 250 mg, 250 mg, Oral, BID, Alger Simons T, MD, 250 mg at 04/07/20 1014 .  sertraline (ZOLOFT) tablet 150 mg, 150 mg, Oral, QHS, Angiulli, Lavon Paganini, PA-C, 150 mg at 04/06/20 2023 .  sodium chloride 0.9 % bolus 1,000 mL, 1,000 mL, Intravenous, Once PRN, Posey Pronto, Domenick Bookbinder, MD .  sodium phosphate (FLEET) 7-19 GM/118ML enema 1 enema, 1 enema, Rectal, Daily PRN, Angiulli, Lavon Paganini, PA-C .  traMADol Veatrice Bourbon) tablet 50 mg, 50 mg, Oral, Q6H PRN, Cathlyn Parsons, PA-C, 50 mg at 04/06/20 2022 .  vitamin B-12 (CYANOCOBALAMIN) tablet 500 mcg, 500 mcg, Oral, Daily, Angiulli, Lavon Paganini, PA-C, 500 mcg at 04/07/20 1015  ROS:    General ROS: negative for - chills, fatigue, fever, night sweats, weight gain or weight loss Psychological ROS: negative for - behavioral disorder, hallucinations, memory  difficulties, mood swings or suicidal ideation Ophthalmic ROS: negative for - blurry vision, double vision, eye pain or loss of vision ENT ROS: negative for - epistaxis, nasal discharge, oral lesions, sore throat, tinnitus or vertigo Allergy and Immunology ROS: negative for - hives or itchy/watery eyes Hematological and Lymphatic ROS: negative for - bleeding problems, bruising or swollen lymph nodes Endocrine ROS: negative for - galactorrhea, hair pattern changes, polydipsia/polyuria or temperature intolerance Respiratory ROS: negative for - cough, hemoptysis, shortness of breath or wheezing Cardiovascular ROS: negative for - chest pain, dyspnea on exertion, edema or irregular heartbeat Gastrointestinal ROS: negative for - abdominal pain, diarrhea, hematemesis, nausea/vomiting or stool incontinence Genito-Urinary ROS: negative for - dysuria, hematuria, incontinence or urinary frequency/urgency Musculoskeletal ROS: Positive for -muscular weakness Neurological ROS: as noted in HPI Dermatological ROS: negative for rash and skin lesion changes  Exam: Current vital signs:  BP (!) 107/48 (BP Location: Left Arm)   Pulse (!) 55   Temp 98.1 F (36.7 C)   Resp 16   Ht 6' (1.829 m)   Wt 108.4 kg   SpO2 97%   BMI 32.41 kg/m  Vital signs in last 24 hours: Temp:  [98.1 F (36.7 C)-98.9 F (37.2 C)] 98.1 F (36.7 C) (07/20 0636) Pulse Rate:  [55-61] 55 (07/20 1404) Resp:  [16-19] 16 (07/20 1404) BP: (107-129)/(48-55) 107/48 (07/20 1404) SpO2:  [97 %-100 %] 97 % (07/20 1404)   Constitutional: Appears well-developed and well-nourished.  Psych: Affect appropriate to situation Eyes: No scleral injection HENT: No OP obstrucion Head: Normocephalic.  Cardiovascular: palpable.  Respiratory: Effort normal, non-labored breathing GI: Soft.  No distension. There is no tenderness.  Skin: WDI  Neuro: Mental Status: Patient is awake, alert, oriented to person, place, month, year, and situation.   Has no problem naming, repeating, comprehension.  Able to follow commands the best of his ability.  No dysarthria or aphasia. Cranial Nerves: II: Visual Fields are full.  III,IV, VI: EOMI with ptosis in left eye. Pupils equal, round and reactive to light V: Facial sensation is symmetric to temperature VII: Slight left facial droop VIII: hearing is intact to voice X: Palat elevates symmetrically XI: Shoulder shrug is symmetric. XII: tongue is midline without atrophy or fasciculations.  Motor: Patient is unable to move his lower extremities.  Bilateral shoulder abduction 4/5, bilateral bicep flexion 4/5 bilateral tricep extension 4/5.  Patient has no wrist flexion/extension and or movements of fingers. Sensory: Sensation is symmetric to light touch and temperature in the arms and legs. Deep Tendon Reflexes: 2+ and symmetric in the biceps and patellae. Plantars: Upgoing toes bilaterally Cerebellar: FNF within normal limits  Labs I have reviewed labs in epic and the results pertinent to this consultation are:  Underwent LP under fluoroscopy: CSF cell count 1 Protein 53 IgG CSF 0.4 IgG serum 602  Labs Lyme disease negative B12 714 Ceruloplasmin normal Folate normal A1c 6.7 NMO positive at 6.5 SSA-negative SSB-negative ACE-19 ANA-negative RF-elevated at 19.1 Serum paraneoplastic panel-negative Antiphospholipid syndrome blood-negative  CBC    Component Value Date/Time   WBC 3.5 (L) 04/07/2020 0524   RBC 4.13 (L) 04/07/2020 0524   HGB 11.6 (L) 04/07/2020 0524   HGB 15.0 04/11/2019 1028   HCT 36.6 (L) 04/07/2020 0524   HCT 46.1 04/11/2019 1028   PLT 280 04/07/2020 0524   PLT 205 04/11/2019 1028   MCV 88.6 04/07/2020 0524   MCV 91 04/11/2019 1028   MCH 28.1 04/07/2020 0524   MCHC 31.7 04/07/2020 0524   RDW 13.1 04/07/2020 0524   RDW 13.0 04/11/2019 1028   LYMPHSABS 0.8 04/07/2020 0524   MONOABS 0.3 04/07/2020 0524   EOSABS 0.1 04/07/2020 0524   BASOSABS 0.0  04/07/2020 0524    CMP     Component Value Date/Time   NA 137 04/06/2020 0738   NA 140 04/11/2019 1028   K 4.0 04/06/2020 0738   CL 105 04/06/2020 0738   CO2 25 04/06/2020 0738   GLUCOSE 119 (H) 04/06/2020 0738   BUN 19 04/06/2020 0738   BUN 21 04/11/2019 1028   CREATININE 0.64 04/06/2020 0738   CREATININE 0.87 04/15/2016 1133   CALCIUM 9.3 04/06/2020 0738   PROT 6.1 (L) 04/03/2020 0551   PROT 6.6 01/20/2020 0754   ALBUMIN 2.2 (L) 04/03/2020 0551   ALBUMIN 4.2 03/10/2020 1757   AST 54 (H) 04/03/2020 0551   ALT 38  04/03/2020 0551   ALKPHOS 61 04/03/2020 0551   BILITOT 0.2 (L) 04/03/2020 0551   BILITOT 0.3 01/20/2020 0754   GFRNONAA >60 04/06/2020 0738   GFRNONAA 87 04/15/2016 1133   GFRAA >60 04/06/2020 0738   GFRAA >89 04/15/2016 1133    Lipid Panel     Component Value Date/Time   CHOL 135 01/20/2020 0754   TRIG 295 (H) 01/20/2020 0754   HDL 23 (L) 01/20/2020 0754   CHOLHDL 5.9 (H) 01/20/2020 0754   CHOLHDL 6 04/15/2016 1133   VLDL 54.8 (H) 03/18/2015 0833   LDLCALC 65 01/20/2020 0754   LDLDIRECT 68 01/20/2020 0754   LDLDIRECT 77.0 04/15/2016 1133     Imaging No recent imaging  Etta Quill PA-C [facility] [phone]  M-F  (9:00 am- 5:00 PM)  04/07/2020, 2:47 PM   NEUROHOSPITALIST ADDENDUM Performed a face to face diagnostic evaluation.   I have reviewed the contents of history and physical exam as documented by PA/ARNP/Resident and agree with above documentation.  I have discussed and formulated the above plan as documented. Edits to the note have been made as needed.   Assessment:  This is a 74 year old male with longitudinally extensive transverse myelitis treated with 5 days of IV steroids and 5 days of IVIG.  Patient had extensive work-up for transverse myelitis- NMO ab +ve.  Was started on Rituxan and received first dose on 7/6 now neurology was consulted for a second dose.  Patient leukopenic with WBC count of  3.5.  Recommendations Continue administer second dose of Rituxan after discussion with Dr. Felecia Shelling Continue physical therapy Continue monitor white count, platelets  Outpatient neurology follow-up with Dr. Felecia Shelling, neuro immunologist in 2 to 4 weeks    Dvon Jiles MD [facility] [phone]   If 7pm to 7am, please call on call as listed on AMION.

## 2020-04-07 NOTE — Progress Notes (Signed)
Occupational Therapy Session Note  Patient Details  Name: Brent [name]. MRN: [id number] Date of Birth: Dec 25, 1945  Today's Date: 04/07/2020 OT Individual Time: 0240-9735 OT Individual Time Calculation (min): 78 min    Short Term Goals: Week 2:  OT Short Term Goal 1 (Week 2): Pt will complete LB bathing at bed level, utilizing circle sitting/figure 4 and AE as needed, with mod assist OT Short Term Goal 2 (Week 2): Pt will don pants at bed level with max assist, utilizing circle sitting/figure 4 and AE as needed. OT Short Term Goal 3 (Week 2): Pt will maintain upright sitting position with CGA for 2 mins to demonstrate increased postural control as needed for UB dressing OT Short Term Goal 4 (Week 2): Pt will monitor and direct weight shifts when OOB with min cues  Skilled Therapeutic Interventions/Progress Updates:    Treatment session with focus on self-care retraining, pressure relief, and functional use of BUE during self-feeding and self-care tasks. Pt received in bed with nurse tech present assisting with feeding.  Encouraged pt to obtain more upright posture to increase success with self-feeding.  Reiterated use of built up u-cuff with nurse tech to allow pt to complete self-feeding after setup.  Pt feed self remainder of breakfast with setup to grasp breakfast sandwith with BUE.  Engaged in LB bathing at bed level with pt able to roll Rt and Lt with mod assist with use of bed rails and able to maintain sidelying with CGA while therapist completing hygiene and donning new brief.  Educated on circle sitting in upright supported position in bed.  Therapist assisted pt with obtaining circle sitting to allow pt to wash lower legs with use of wash mit due to decreased sustained grasp on standard wash cloth.  Pt able to reach to ankles but unable to wash feet due to decreased trunk control and weight shift.  Therapist gathered pants and donned over foot, pt then able to pull pants up leg and  over knee of RLE but unable to complete LLE due to tight elastic.  Required +2 to position maximove sling at bed level and transferred OOB to power w/c with use of mechanical lift and +2 for safety to ensure proper positioning in w/c.  Engaged in anterior weight shift in sitting to reposition hips for more equal weight bearing in sitting.  Pt able to maneuver power w/c to sink to complete oral care.  Setup to apply toothpaste with pt then able to brush teeth utilizing BUE together.  Therapist assisting with brining sips of water to mouth to allow pt to rinse and spit.  Reiterated recommendation for pressure relief every 30 mins to prevent future breakdown as well as promote healing of current wound above sacrum.  Pt remained upright in power w/c with belt around thighs to promote increased BLE positioning to maintain upright sitting position and left with all needs in reach and seat belt alarm on.  Therapy Documentation Precautions:  Precautions Precautions: Fall Precaution Comments: quadriplegia Restrictions Weight Bearing Restrictions: No General:   Vital Signs: Therapy Vitals Temp: 98.1 F (36.7 C) Pulse Rate: (!) 55 Resp: 17 BP: (!) 112/53 Patient Position (if appropriate): Lying Oxygen Therapy SpO2: 98 % O2 Device: Room Air Pain:  Pt with no c/o pain   Therapy/Group: Individual Therapy  Simonne Come 04/07/2020, 9:13 AM

## 2020-04-08 ENCOUNTER — Inpatient Hospital Stay (HOSPITAL_COMMUNITY): Payer: Medicare Other

## 2020-04-08 LAB — GLUCOSE, CAPILLARY
Glucose-Capillary: 96 mg/dL (ref 70–99)
Glucose-Capillary: 114 mg/dL — ABNORMAL HIGH (ref 70–99)
Glucose-Capillary: 129 mg/dL — ABNORMAL HIGH (ref 70–99)
Glucose-Capillary: 157 mg/dL — ABNORMAL HIGH (ref 70–99)

## 2020-04-08 MED ORDER — COLLAGENASE 250 UNIT/GM EX OINT
TOPICAL_OINTMENT | Freq: Every day | CUTANEOUS | Status: DC
  Filled 2020-04-08: qty 30

## 2020-04-08 NOTE — Progress Notes (Addendum)
[name] PHYSICAL MEDICINE & REHABILITATION PROGRESS NOTE  Subjective/Complaints: Patient seen laying in bed this morning.  He was seen by neurology yesterday regarding leukopenia with associated Rituxan infusion, notes reviewed-proceed with infusion.  He states he slept well overnight.  He states he tolerated his Rituxan infusion yesterday.  He states he had a small bowel movement last night.  ROS: Denies CP, SOB, N/V/D  Objective: Vital Signs: Blood pressure (!) 116/50, pulse (!) 55, temperature 97.9 F (36.6 C), resp. rate 18, height 6' (1.829 m), weight 108.4 kg, SpO2 97 %. No results found. Recent Labs    04/06/20 0823 04/07/20 0524  WBC 4.6 3.5*  HGB 12.3* 11.6*  HCT 39.1 36.6*  PLT 305 280   Recent Labs    04/06/20 0738  NA 137  K 4.0  CL 105  CO2 25  GLUCOSE 119*  BUN 19  CREATININE 0.64  CALCIUM 9.3    Physical Exam: BP (!) 116/50 (BP Location: Right Arm)   Pulse (!) 55   Temp 97.9 F (36.6 C)   Resp 18   Ht 6' (1.829 m)   Wt 108.4 kg   SpO2 97%   BMI 32.41 kg/m  Constitutional: No distress . Vital signs reviewed. HENT: Normocephalic.  Atraumatic. Eyes: EOMI. No discharge. Cardiovascular: No JVD. Respiratory: Normal effort.  No stridor. GI: Non-distended. GU: + Foley. Skin: Sacral DTI not examined today.  Warm and dry. Psych: Normal mood.  Normal behavior. Musc: No edema in extremities.  No tenderness in extremities. Neuro: Alert Aware of deficits Motor:  RUE: Shoulder abduction, elbow flexion 5/5, wrist extension 4+/5, elbow ext 4+/5, distally 0/5, stable LUE: Shoulder abduction, elbow flexion 5/5, wrist extension 4+/5, elbow ext 4/5, hand grip 0/5, stable B/l LE: 0/5 proxima to distal, stable Sensation intact light touch  Assessment/Plan: 1. Functional deficits secondary to NMO/transverse myelitis which require 3+ hours per day of interdisciplinary therapy in a comprehensive inpatient rehab setting.  Physiatrist is providing close team  supervision and 24 hour management of active medical problems listed below.  Physiatrist and rehab team continue to assess barriers to discharge/monitor patient progress toward functional and medical goals  Care Tool:  Bathing    Body parts bathed by patient: Right arm, Left arm, Chest, Abdomen, Right upper leg, Left upper leg, Face   Body parts bathed by helper: Buttocks, Right lower leg, Left lower leg     Bathing assist Assist Level: Moderate Assistance - Patient 50 - 74%     Upper Body Dressing/Undressing Upper body dressing Upper body dressing/undressing activity did not occur (including orthotics): N/A What is the patient wearing?: Pull over shirt    Upper body assist Assist Level: Minimal Assistance - Patient > 75%    Lower Body Dressing/Undressing Lower body dressing      What is the patient wearing?: Pants, Incontinence brief     Lower body assist Assist for lower body dressing: Total Assistance - Patient < 25%     Toileting Toileting    Toileting assist Assist for toileting: Dependent - Patient 0%     Transfers Chair/bed transfer  Transfers assist     Chair/bed transfer assist level: Dependent - mechanical lift     Locomotion Ambulation   Ambulation assist   Ambulation activity did not occur: Safety/medical concerns (quadriplegia, decreased balance/postural control)          Walk 10 feet activity   Assist  Walk 10 feet activity did not occur: Safety/medical concerns (quadriplegia, decreased balance/postural control)  Walk 50 feet activity   Assist Walk 50 feet with 2 turns activity did not occur: Safety/medical concerns (quadriplegia, decreased balance/postural control)         Walk 150 feet activity   Assist Walk 150 feet activity did not occur: Safety/medical concerns (quadriplegia, decreased balance/postural control)         Walk 10 feet on uneven surface  activity   Assist Walk 10 feet on uneven surfaces  activity did not occur: Safety/medical concerns (quadriplegia, decreased balance/postural control)         Wheelchair     Assist Will patient use wheelchair at discharge?: Yes Type of Wheelchair: Power    Wheelchair assist level: Supervision/Verbal cueing Max wheelchair distance: 300    Wheelchair 50 feet with 2 turns activity    Assist        Assist Level: Supervision/Verbal cueing   Wheelchair 150 feet activity     Assist     Assist Level: Supervision/Verbal cueing      Medical Problem List and Plan: 1.  Quadriparesis secondary to transverse myelitis/NMO.  IV IG completed 03/20/2020 as well as received Rituxan dose 03/24/2020, repeat dose on 7/20.   Continue CIR  B/L PRAFO nightly  Will consider bilateral tenodesis plans after discussion with therapies  Team conference today to discuss current and goals and coordination of care, home and environmental barriers, and discharge planning with nursing, case manager, and therapies.  Please see note as well. 2.  Antithrombotics: -DVT/anticoagulation: Lovenox                Vascular study negative for DVT             -antiplatelet therapy: Aspirin 81 mg daily 3. Pain Management: Lidoderm patch, tramadol as needed  Controlled on 7/21  Monitor with increased exertion 4. Mood: Zoloft 150 mg nightly, Ativan as needed             -antipsychotic agents: N/A 5. Neuropsych: This patient is capable of making decisions on his own behalf. 6. Skin/Wound Care: Routine skin checks  Air mattress ordered  Continue to encourage pressure-relief 7. Fluids/Electrolytes/Nutrition: Routine in and outs.  8.  Diabetes mellitus type 2 with hyperglycemia.  Hemoglobin A1c 6.7.    On Jardiance 12.5 mg daily, Metformin 1000 mg twice daily, Lantus 30 units, NovoLog 25-30 units 3 times daily PTA  Lantus insulin 30 units daily  Metformin 500 twice daily started on 7/13 on hold at present  Jardiance 10 started on 7/16  Slightly labile on  7/21 9. HCAP/OSA.  Completed course of Maxipime initiated 03/18/2020 x14 doses.  Continue BiPAP 10. Hyperlipidemia. Lipitor/Vascepa 11. Hypertension.  Lisinopril 20 mg daily  Controlled on 7/21  Monitor with increased mobility 12.  Transaminitis  LFTs elevated, but improving on 7/16  Continue to monitor 13.  Leukopenia-likely medication induced  WBCs 3.5 on 7/20, labs ordered for tomorrow  Discussed with pharmacy - [place] WNL  Continue to monitor 14.  Acute blood loss anemia  Hemoglobin 11.6 on 7/20, labs ordered for tomorrow  Continue to monitor 10.  Neurogenic bowel/bladder  Indwelling Foley due to traumatic I/O caths, will trial I/O caths again tomorrow  Bowel program:    -miralax stopped   -added probiotic 7/17   -PM suppository DC'd on 7/19   -added metamucil    Continue digital stimulation   Improving  LOS: 14 days A FACE TO FACE EVALUATION WAS PERFORMED  Brent [name] 04/08/2020, 8:07 AM

## 2020-04-08 NOTE — Patient Care Conference (Signed)
Inpatient RehabilitationTeam Conference and Plan of Care Update Date: 04/08/2020   Time: 3:16 PM    Patient Name: Brent [name].      Medical Record Number: [id number]  Date of Birth: 1946/02/04 Sex: Male         Room/Bed: 4M08C/4M08C-01 Payor Info: Payor: MEDICARE / Plan: MEDICARE PART A AND B / Product Type: *No Product type* /    Admit Date/Time:  03/25/2020  2:57 PM  Primary Diagnosis:  Quadriplegia and quadriparesis [facility])  Hospital Problems: Principal Problem:   Quadriplegia and quadriparesis ([place]) Active Problems:   Acute blood loss anemia   Leukopenia   Transaminitis   Controlled type 2 diabetes mellitus with hyperglycemia, with long-term current use of insulin (HCC)   Neurogenic bladder   Neurogenic bowel   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 Pt incont of bowel. Bowel program after supper. LBM 04/08/20  Become continent, removal of urinary catheter  Asses q shift and prn, continue wirth bowel program   Swallow/Nutrition/ Hydration             ADL's   min-max assist sitting balance, max for dynamic sitting balance, mod assist bathing with LB at bed level and UB at sink, min assist UB dressing, total assist LB dressing.  Using [facility] for transfers at this time due to wound on buttocks.  ModA dynamic sitting, toileting, LB dsg; MinA grooming, bathing; set up eating/UB dress, func use of BUE  ADL retraining, static and dynamic sitting balance, trunk control,  functional use of BUE   Mobility   max A rolling, max A +2 supine<>sit and sit<>supine, bed<>power WC +2 assist (via Maximove due to sacral wounds), power WC mobility 337ft supervision  mod A at w/c level  functional mobility/transfers, dynamic sitting balance/coordination, trunk control, endurance   Communication             Safety/Cognition/ Behavioral Observations            Pain   C/o back pain 7/10  pain less then 2 on 0/10 scale   Assess pan q shift and prn    Skin   MASD to groin. DTI to bottom, skin tear below DTI. skin pink and serousenguanous drainage  prevent furthur wound breakdown   Assess skin q shift and prn. Apply medication to groin      Team Discussion:  Discharge Planning/Teaching Needs:  Goal to discharge home. Patient level of care to high for spouse. Looking into home care, SNF and palliative/hospice  Will schedule with wife if reccommended   Current Update: on target  Current Barriers to Discharge:  Inaccessible home environment, Decreased caregiver support, Neurogenic bowel and bladder, Wound care and Lack of/limited family support  Possible Resolutions to Barriers: SNF placement at discharge vs hired attendants in home Extensive training with the wife on neurogenic bowel and bladder management and wound care   Patient on target to meet rehab goals: no, goals were downgraded. Wife and patient aware of the changes in the  goals and understands rationale for changes made.  *See Care Plan and progress notes for long and short-term goals.   Revisions to Treatment Plan:  Foley discontinue order for 04/09/20 with I+O catheterization trials for discharge/training with wife to resume Modified transfers with equipment( maximove) instead of slide board due to sacral wound    Medical Summary Current Status: Quadriparesis secondary to transverse myelitis/NMO.  IV IG completed 03/20/2020 as well as received Rituxan dose 03/24/2020, repeat dose on 7/20 Weekly  Focus/Goal: Improve mobility, neurogic bowel/bladder, CBGs  Barriers to Discharge: Medical stability;Decreased family/caregiver support   Possible Resolutions to Barriers: Therapies, follow labs, optimize DM/BP meds, bowel/bladder program, ?orthosis   Continued Need for Acute Rehabilitation Level of Care: The patient requires daily medical management by a physician with specialized training in physical medicine and rehabilitation for the following reasons: Direction of a multidisciplinary physical rehabilitation program to maximize functional independence : Yes Medical management of patient stability for increased activity during participation in an intensive rehabilitation regime.: Yes Analysis of laboratory values and/or radiology reports with any subsequent need for medication adjustment and/or medical intervention. : Yes   I attest that I was present, lead the team conference, and concur with the assessment and plan of the team.   Dorien Chihuahua B 04/08/2020, 3:16 PM

## 2020-04-08 NOTE — Progress Notes (Signed)
Physical Therapy Session Note  Patient Details  Name: Brent [name]. MRN: [id number] Date of Birth: 04-Jun-1946  Today's Date: 04/08/2020 PT Individual Time: 1000-1029 and 1345-1455 PT Individual Time Calculation (min): 29 min and 70 min  Short Term Goals: Week 1:  PT Short Term Goal 1 (Week 1): Pt will roll L and R with mod A and use of bed features PT Short Term Goal 1 - Progress (Week 1): Progressing toward goal PT Short Term Goal 2 (Week 1): Pt will transfer supine<>sitting EOB with max A of 1 PT Short Term Goal 2 - Progress (Week 1): Progressing toward goal PT Short Term Goal 3 (Week 1): Pt will transfer bed<>WC with LRAD max A of 1 PT Short Term Goal 3 - Progress (Week 1): Progressing toward goal Week 2:  PT Short Term Goal 1 (Week 2): Pt will perform rolling L/R with mod A x 1 PT Short Term Goal 2 (Week 2): Pt will initiate power wheelchair mobility PT Short Term Goal 3 (Week 2): Pt will maintain static sitting balance x 5 min with CGA  Skilled Therapeutic Interventions/Progress Updates:   Treatment Session 1: 1000-1029 29 min Received pt sitting in power WC, pt agreeable to therapy, and denied any pain during session. Session with emphasis on power WC mobility, dynamic sitting balance and trunk control, and improved activity tolerance. Pt performed power WC mobility 166ft x 2 trials to/from gym with supervision. Worked on visual scanning, dynamic sitting balance, reaching outside BOS, and fine motor control collecting horseshoes along rails in hallway and from various heights with supervision and increased time to grip onto horseshoes. Pt weaved in/out of cones in hallway x 4 trials with supervision and cues for wider turning with success 3/4 trials. Attempted navigating power WC backwards using green line on floor for visual target, however pt with increased difficulty managing joystick quickly enough to correct misalignment. Concluded session with pt sitting in power WC, needs  within reach, and power WC seatbelt and seatbelt alarm on.   Treatment Session 2: 1345-1455 70 min Received pt sitting in power WC, pt agreeable to therapy, and denied any pain during session but continues to report discomfort along L upper trapezius. Session with emphasis on functional mobility/transfers, community navigation in power WC, dynamic sitting balance, trunk control, and fine motor control and improved activity tolerance. Pt requested to go outside for fresh air and performed power WC mobility 233ft to elevator with supervision. Pt able to navigate through doorways, along hallways, and into elevators with supervision. Pt performed power WC mobility through cafeteria downstairs and outside x500 feet with supervision along uneven surfaces. Pt required multiple rest/water breaks throughout session due to increased fatigue. Pt performed power WC mobility to dayroom and worked on dynamic sitting balance, trunk control, reaching outside BOS, and fine motor control playing connect 4 x 1 trial with min/mod A to maintain sitting balance without back support. Pt required assist to grasp pieces and drop them into connect 4 slots. Pt requested to return to bed and performed power WC mobility >127ft with supervision back to room. Required +2 assist to position sling and pt transferred power WC<>bed dependently via Maximove with increased time due to 2 batteries dying during transfer. Pt rolled to L and R with mod/max A and required +2 assist to doff sling. Doffed shoes total A. Concluded session with pt supine in bed, needs within reach, and no bed alarm on per mattress settings.   Therapy Documentation Precautions:  Precautions Precautions:  Fall Precaution Comments: quadriplegia Restrictions Weight Bearing Restrictions: No  Therapy/Group: Individual Therapy Alfonse Alpers PT, DPT   04/08/2020, 7:35 AM

## 2020-04-08 NOTE — Consult Note (Signed)
[facility] Nurse Consult Note: Reason for Consult:Follow up for right sacral Unstageable PI and coccygeal Stage 3 PI Wound type:Pressure, pressure  plus moisture Pressure Injury POA: No Measurement: Right sacral wound measures 5cm x 4.5cm and is 90% covered with eschar. There are two area in the center for which the eschar has dissolved and pink tissue is evident. Small serous exudate on old foam dressing Coccygeal wound, Stage 3 PI, located in the gluteal cleft: measures 4cm x 0.5cm x 0.2cm with pink, moist center. Moderate pink exudate. Left IT:  Silicone foam is placed here prophylactically.  No skin injury noted. Wound bed:As noted above Drainage (amount, consistency, odor) As noted above Periwound: intact. Dressing procedure/placement/frequency: I will add collagenase to the right sacral wound and a thin strip of silver hydrofiber to the gluteal cleft Stage 3 PI today. Patient is able to self reposition from side to side was wearing and incontinent brief at the time of my assessment.  Recommend discontinuing use of incontinence briefs while in bed.  [facility] nursing team will follow, seeing patient every 7-10 days and will remain available to this patient, the nursing and medical teams.   Thanks, Maudie Flakes, MSN, RN, [place], Arther Abbott  Pager# [phone]

## 2020-04-08 NOTE — Progress Notes (Signed)
Occupational Therapy Session Note  Patient Details  Name: Brent [name]. MRN: [id number] Date of Birth: July 09, 1946  Today's Date: 04/08/2020 OT Individual Time: 0800-0900 OT Individual Time Calculation (min): 60 min    Short Term Goals: Week 1:  OT Short Term Goal 1 (Week 1): patient will increase bilateral hand ROM/dexterity and strength to perform eating and basic grooming tasks with min A OT Short Term Goal 1 - Progress (Week 1): Met OT Short Term Goal 2 (Week 1): patient will increase UB adl to mod A bed level - progress to seated in w/c as able OT Short Term Goal 2 - Progress (Week 1): Met OT Short Term Goal 3 (Week 1): patient will roll in bed with mod A and tolerate sitting in TIS w/c for 1 hour OT Short Term Goal 3 - Progress (Week 1): Partly met OT Short Term Goal 4 (Week 1): patient will direct lift transfer with mod cues OT Short Term Goal 4 - Progress (Week 1): Progressing toward goal OT Short Term Goal 5 (Week 1): patient will monitor and direct weight shifts with min cues OT Short Term Goal 5 - Progress (Week 1): Progressing toward goal  Skilled Therapeutic Interventions/Progress Updates:    1:1. Pt received in bed agreeable to OT but reporting not had breakfast yet. Pt self feeds seated in bed wit HOB elevated to 45 degrees with set up of sandwich wrapped in towel to hold together. Pt provided with lidded mug with handle and long straw to use with hot liquids to reduce risk of spilling onto self. Pt able to obtain off of the table and bring it up to face. Pt completes UB dressing with MOD A to doff/don shirt, S for UB bathing with wash mitt and mod A for washing LB with A to wash Les in long sitting figure 4. Pt req total A for LB dressing at bed level and rolls with MIN-MOD A +2 in B directions for CM, pulling shirt down back and placing sling. Utilized maxi move to transfer into w/c with +2 A to position hips in w/c. Exited session with pt seated in w/c, seat belt, belt  alarm and belt around thighs utilized to position pt in PWC. Pt able to verbalize pressure relief protocol. Exited session with pt seated in PWC, exit alarm on and call light in reach   Therapy Documentation Precautions:  Precautions Precautions: Fall Precaution Comments: quadriplegia Restrictions Weight Bearing Restrictions: No General:   Vital Signs:   Pain:   ADL: ADL Eating: Maximal assistance Where Assessed-Eating: Bed level Grooming: Maximal assistance Where Assessed-Grooming: Bed level Upper Body Bathing: Maximal assistance Where Assessed-Upper Body Bathing: Bed level Lower Body Bathing: Dependent Where Assessed-Lower Body Bathing: Bed level Upper Body Dressing: Maximal assistance Where Assessed-Upper Body Dressing: Bed level Lower Body Dressing: Dependent Where Assessed-Lower Body Dressing: Bed level Toileting: Dependent Where Assessed-Toileting: Bed level Vision   Perception    Praxis   Exercises:   Other Treatments:     Therapy/Group: Individual Therapy  Tonny Branch 04/08/2020, 10:30 AM

## 2020-04-08 NOTE — Progress Notes (Signed)
Patient ID: Brent [name]., male   DOB: 1946-05-15, 74 y.o.   MRN: [id number] Team Conference Report to Patient/Family  Team Conference discussion was reviewed with the patient and caregiver, including goals, any changes in plan of care and target discharge date.  Patient and caregiver express understanding and are in agreement.  The patient has a target discharge date of 04/30/20 (SNF vs home with HH/hired attendants for care).  SW call left spouse a voicemail about a conference to health her decision of [facility] VS. SNF  Dyanne Iha 04/08/2020, 1:34 PM

## 2020-04-09 ENCOUNTER — Inpatient Hospital Stay (HOSPITAL_COMMUNITY): Payer: Medicare Other

## 2020-04-09 ENCOUNTER — Inpatient Hospital Stay (HOSPITAL_COMMUNITY): Payer: Medicare Other | Admitting: Occupational Therapy

## 2020-04-09 DIAGNOSIS — L98423 Non-pressure chronic ulcer of back with necrosis of muscle: Secondary | ICD-10-CM | POA: Insufficient documentation

## 2020-04-09 LAB — CBC WITH DIFFERENTIAL/PLATELET
Abs Immature Granulocytes: 0.03 10*3/uL (ref 0.00–0.07)
Basophils Absolute: 0 10*3/uL (ref 0.0–0.1)
Basophils Relative: 1 %
Eosinophils Absolute: 0.1 10*3/uL (ref 0.0–0.5)
Eosinophils Relative: 2 %
HCT: 37.9 % — ABNORMAL LOW (ref 39.0–52.0)
Hemoglobin: 11.9 g/dL — ABNORMAL LOW (ref 13.0–17.0)
Immature Granulocytes: 1 %
Lymphocytes Relative: 25 %
Lymphs Abs: 0.9 10*3/uL (ref 0.7–4.0)
MCH: 28 pg (ref 26.0–34.0)
MCHC: 31.4 g/dL (ref 30.0–36.0)
MCV: 89.2 fL (ref 80.0–100.0)
Monocytes Absolute: 0.4 10*3/uL (ref 0.1–1.0)
Monocytes Relative: 10 %
Neutro Abs: 2.2 10*3/uL (ref 1.7–7.7)
Neutrophils Relative %: 61 %
Platelets: 267 10*3/uL (ref 150–400)
RBC: 4.25 MIL/uL (ref 4.22–5.81)
RDW: 13.3 % (ref 11.5–15.5)
WBC: 3.6 10*3/uL — ABNORMAL LOW (ref 4.0–10.5)
nRBC: 0 % (ref 0.0–0.2)

## 2020-04-09 LAB — GLUCOSE, CAPILLARY
Glucose-Capillary: 134 mg/dL — ABNORMAL HIGH (ref 70–99)
Glucose-Capillary: 150 mg/dL — ABNORMAL HIGH (ref 70–99)
Glucose-Capillary: 157 mg/dL — ABNORMAL HIGH (ref 70–99)
Glucose-Capillary: 185 mg/dL — ABNORMAL HIGH (ref 70–99)

## 2020-04-09 NOTE — Progress Notes (Signed)
Physical Therapy Weekly Progress Note  Patient Details  Name: Brent [name]. MRN: [id number] Date of Birth: 11-Mar-1946  Beginning of progress report period: March 26, 2020 End of progress report period: April 09, 2020  Patient has met 2 of 3 short term goals. Pt demonstrates extremely slow progress towards long term goals. Pt currently able to roll to L and R with mod A and use of bedrails. Pt able to transfer supine<>sit and sit<>supine with max A for trunk control and LE management and requires min/mod A overall to maintain static sitting balance. Due to recent sacral wound, pt has been transferring bed<>power WC dependently via Maximove to prevent sheering however prior to wound, was able to transfer bed<>chair via slideboard with max A +2. Pt introduced to power WC and currently able to perform power tilt/recline with supervision and perform power WC mobility >571f in community environment with supervision overall. Pt continues to be limited by impairments secondary to quadriplegia.   Patient continues to demonstrate the following deficits muscle weakness, muscle joint tightness and muscle paralysis, abnormal tone, unbalanced muscle activation and decreased coordination and decreased sitting balance, decreased postural control and decreased balance strategies and therefore will continue to benefit from skilled PT intervention to increase functional independence with mobility.  Patient progressing toward long term goals..  Continue plan of care.  PT Short Term Goals Week 2:  PT Short Term Goal 1 (Week 2): Pt will perform rolling L/R with mod A x 1 PT Short Term Goal 1 - Progress (Week 2): Met PT Short Term Goal 2 (Week 2): Pt will initiate power wheelchair mobility PT Short Term Goal 2 - Progress (Week 2): Met PT Short Term Goal 3 (Week 2): Pt will maintain static sitting balance x 5 min with CGA PT Short Term Goal 3 - Progress (Week 2): Progressing toward goal Week 3:  PT Short Term Goal  1 (Week 3): Pt will maintain static sitting balance x 5 min with CGA PT Short Term Goal 2 (Week 3): Pt will begin to verbalize instruction for lift transfers PT Short Term Goal 3 (Week 3): Pt will demonstrate power tilt and recline features  Skilled Therapeutic Interventions/Progress Updates:  Discharge planning;Functional mobility training;Psychosocial support;Therapeutic Activities;Balance/vestibular training;Disease management/prevention;Neuromuscular re-education;Skin care/wound management;Therapeutic Exercise;Wheelchair propulsion/positioning;DME/adaptive equipment instruction;Pain management;UE/LE Strength taining/ROM;Community reintegration;Functional electrical stimulation;Patient/family education;UE/LE Coordination activities   Therapy Documentation Precautions:  Precautions Precautions: Fall Precaution Comments: quadriplegia Restrictions Weight Bearing Restrictions: No  Therapy/Group: Individual Therapy AAlfonse AlpersPT, DPT   04/09/2020, 7:35 AM

## 2020-04-09 NOTE — Progress Notes (Signed)
Patient ID: Brent Moster., male   DOB: March 30, 1946, 74 y.o.   MRN: [id number]   SW has not received follow up from patient spouse to set meeting to further discuss what patient discharge would look like.

## 2020-04-09 NOTE — Progress Notes (Signed)
Dressing changed on bottom according to orders. Pt had two small bowel movements brown of color. Foley care complete. Pt is laying in bed with no brief and open to air to prevent further breakdown. Foam heal protectant boots at night, prefor boots donned this am. Pt had problem with a particle being stuck in his throat. He was given water, crackers and requested cough drop. He was able to cough up brown piece of food he stated he had earlier. He stated his throat was sore this morning and requested a cough drop. No other problems to report.

## 2020-04-09 NOTE — Progress Notes (Signed)
Physical Therapy Session Note  Patient Details  Name: Brent [name]. MRN: [id number] Date of Birth: 08/02/1946  Today's Date: 04/09/2020 PT Individual Time: 1761-6073 and 7106-2694  PT Individual Time Calculation (min): 43 min and 38 min  Short Term Goals: Week 2:  PT Short Term Goal 1 (Week 2): Pt will perform rolling L/R with mod A x 1 PT Short Term Goal 1 - Progress (Week 2): Met PT Short Term Goal 2 (Week 2): Pt will initiate power wheelchair mobility PT Short Term Goal 2 - Progress (Week 2): Met PT Short Term Goal 3 (Week 2): Pt will maintain static sitting balance x 5 min with CGA PT Short Term Goal 3 - Progress (Week 2): Progressing toward goal Week 3:  PT Short Term Goal 1 (Week 3): Pt will maintain static sitting balance x 5 min with CGA PT Short Term Goal 2 (Week 3): Pt will begin to verbalize instruction for lift transfers PT Short Term Goal 3 (Week 3): Pt will demonstrate power tilt and recline features  Skilled Therapeutic Interventions/Progress Updates:  Discharge planning;Functional mobility training;Psychosocial support;Therapeutic Activities;Balance/vestibular training;Disease management/prevention;Neuromuscular re-education;Skin care/wound management;Therapeutic Exercise;Wheelchair propulsion/positioning;DME/adaptive equipment instruction;Pain management;UE/LE Strength taining/ROM;Community reintegration;Functional electrical stimulation;Patient/family education;UE/LE Coordination activities   Today's Interventions: Treatment Session 1: 1045-1128 43 min Received pt supine in bed, pt agreeable to therapy, and did not report pain during session. Session with emphasis on functional mobility/transfers, power WC mobility, dynamic sitting balance/coordination, trunk control, UE strengthening, and improved activity tolerance. Doffed PRAFO boots and donned tennis shoes total A and pt rolled to L and R with mod A and required +2 assist to position Maximove sling. Pt transferred  bed<>power WC via Maximove dependently with +2 assist to prevent sheering of sacral wounds. Positioned bilateral hips in power WC with gait belt to prevent excessive hip abduction. Pt performed power WC mobility 182f x 2 trials to/from therapy gym with supervision. Pt performed the following exercises seated in power WC with back support, supervision, and verbal cues for technique: -overhead shoulder flexion with 3/4lb wrist weight x12 reps bilaterally  -horizontal chest press at 90 degrees with 3/4lb wrist weight x12 reps bilaterally  Worked on dynamic sitting balance with emphasis on trunk control to bring bilateral elbows to knees with mod A to achieve position and mod A fading to min A to maintain position. Concluded session with pt semi-reclined in power WC, needs within reach, and seatbelt alarm and power WC seatbelt on.   Treatment Session 2: 1445-1523 38 min Received pt sitting in power WC with recreational therapy present, pt agreeable to therapy, and denied pain at start of session but reported increased discomfort in L upper trapezius with activity. With recreational therapy and pt, discussed pt's grandson and how pt is emotionally handling his current situation and explaining it all to his grandson. Discussed strategies and possibility of co-treat with recreational therapy next week with pt and pt's grandson while performing therapeutic activities. Session with emphasis on functional mobility/power WC mobility, dynamic sitting balance/coordination, trunk control, UE strengthening/ROM, manual therapy, and improved activity tolerance. Pt performed power WC mobility >1526fx 2 trials to/from dayroom with supervision. Worked on dynamic sitting balance, trunk control, and UE ROM playing volleyball with rehab tech x3 trials for approximately 2 minutes each with min/mod A from therapist to maintain sitting balance. Pt reported increased L upper trapezius discomfort and therapist noted trigger point on L  upper trapezius. Therapist performed trigger point release with deep pressure to area and pt reported decreased  symptoms. Extensive discussion had regarding D/C options (home with assist versus SNF) and therapist encouraged pt to communicate with wife to come to decision. Therapist educated pt that if they decide to go home, pt's wife needs to attend multiple family education training sessions for [facility] lift transfers and discuss power wheelchair seating options and methods to transport chair. Pt agreed to discuss with wife. Concluded session with pt semi-reclined in TIS WC, needs within reach, and seatbelt alarm and power WC seatbelt on.   Therapy Documentation Precautions:  Precautions Precautions: Fall Precaution Comments: quadriplegia Restrictions Weight Bearing Restrictions: No  Therapy/Group: Individual Therapy Alfonse Alpers PT, DPT   04/09/2020, 7:37 AM

## 2020-04-09 NOTE — Plan of Care (Signed)
  Problem: RH Car Transfers Goal: LTG Patient will perform car transfers with assist (PT) Description: LTG: Patient will perform car transfers with assistance (PT). Outcome: Not Applicable Flowsheets (Taken 04/09/2020 0740) LTG: Pt will perform car transfers with assist:: (D/C due to SCI impairments) -- Note: D/C due to SCI impairments   Problem: RH Bed to Chair Transfers Goal: LTG Patient will perform bed/chair transfers w/assist (PT) Description: LTG: Patient will perform bed to chair transfers with assistance (PT). Flowsheets (Taken 04/09/2020 0740) LTG: Pt will perform Bed to Chair Transfers with assistance level: (downgraded due to quadriplegia, generalized weakness, and decreased trunk control) Maximal Assistance - Patient 25 - 49% Note: downgraded due to quadriplegia, generalized weakness, and decreased trunk control

## 2020-04-09 NOTE — Progress Notes (Signed)
Occupational Therapy Session Note  Patient Details  Name: Brent [name]. MRN: [id number] Date of Birth: 08-12-1946  Today's Date: 04/09/2020 OT Individual Time: 1300-1330 OT Individual Time Calculation (min): 30 min    Short Term Goals: Week 1:  OT Short Term Goal 1 (Week 1): patient will increase bilateral hand ROM/dexterity and strength to perform eating and basic grooming tasks with min A OT Short Term Goal 1 - Progress (Week 1): Met OT Short Term Goal 2 (Week 1): patient will increase UB adl to mod A bed level - progress to seated in w/c as able OT Short Term Goal 2 - Progress (Week 1): Met OT Short Term Goal 3 (Week 1): patient will roll in bed with mod A and tolerate sitting in TIS w/c for 1 hour OT Short Term Goal 3 - Progress (Week 1): Partly met OT Short Term Goal 4 (Week 1): patient will direct lift transfer with mod cues OT Short Term Goal 4 - Progress (Week 1): Progressing toward goal OT Short Term Goal 5 (Week 1): patient will monitor and direct weight shifts with min cues OT Short Term Goal 5 - Progress (Week 1): Progressing toward goal  Skilled Therapeutic Interventions/Progress Updates:    1:1 Pt received up in the power chair. Discussed understanding that his wife and him are discussing options of home v continued skill therapy at SNF. Introduced the [employer] account planner and demonstrated by transferring the pt from the power chair to the EOM. At the [facility] focus on trying to find and maintain static balance and ability to place Ue slightly behind his trunk. Pt reports fatigue after one minutes requiring full support.   Used manual lift to return to power chair with chair in slight tilted position to help optimally position his hips. Pt able to drive his power chair back to the room and tilt back to achieve full pressure relief. Left resting in the w/c with call bell.   Therapy Documentation Precautions:  Precautions Precautions: Fall Precaution Comments:  quadriplegia Restrictions Weight Bearing Restrictions: No Pain: No c/o pain in session   Therapy/Group: Individual Therapy  Willeen Cass [facility] 04/09/2020, 4:02 PM

## 2020-04-09 NOTE — Progress Notes (Signed)
Occupational Therapy Session Note  Patient Details  Name: Brent [name]. MRN: [id number] Date of Birth: 20-Aug-1946  Today's Date: 04/09/2020 OT Individual Time: 2774-1287 OT Individual Time Calculation (min): 46 min    Short Term Goals: Week 2:  OT Short Term Goal 1 (Week 2): Pt will complete LB bathing at bed level, utilizing circle sitting/figure 4 and AE as needed, with mod assist OT Short Term Goal 2 (Week 2): Pt will don pants at bed level with max assist, utilizing circle sitting/figure 4 and AE as needed. OT Short Term Goal 3 (Week 2): Pt will maintain upright sitting position with CGA for 2 mins to demonstrate increased postural control as needed for UB dressing OT Short Term Goal 4 (Week 2): Pt will monitor and direct weight shifts when OOB with min cues  Skilled Therapeutic Interventions/Progress Updates:    Treatment session with focus on ADL retraining. Pt received semi-reclined in bed with NT present. No pain reported. Provided cueing throughout bed level activity to reduce R lateral lean, repositioning due to pt reported discomfort in shoulder. Completed bed level UB bathing with minA to don wash mit with HOB elevated. Donned shirt with modA to assist with anterior weight shift and advance shirt over head. Bathed LB with modA to place legs into circle sitting and assist with washing feet of BLE. Pt incontinent of bowel upon rolling, completed perineal care with total A with pt rolling with minA. Completed oral care with modA in bed to place toothpaste, with pt able to maneuver toothbrush independently. Ended session with pt semi-reclined in bed with all needs within reach and bed alarm on.  Therapy Documentation Precautions:  Precautions Precautions: Fall Precaution Comments: quadriplegia Restrictions Weight Bearing Restrictions: No  Pain:  Minimal pain in L shoulder, repositioned  Therapy/Group: Individual Therapy  Michelle Nasuti 04/09/2020, 12:17 PM

## 2020-04-09 NOTE — Progress Notes (Signed)
[facility] PHYSICAL MEDICINE & REHABILITATION PROGRESS NOTE  Subjective/Complaints: Patient seen sitting up in bed this morning eating breakfast.  He states he slept well overnight.  He states he had a bowel movement yesterday evening.  ROS: Denies CP, SOB, N/V/D  Objective: Vital Signs: Blood pressure (!) 115/47, pulse (!) 52, temperature 98 F (36.7 C), temperature source Oral, resp. rate 18, height 6' (1.829 m), weight 108.4 kg, SpO2 97 %. No results found. Recent Labs    04/07/20 0524 04/09/20 0552  WBC 3.5* 3.6*  HGB 11.6* 11.9*  HCT 36.6* 37.9*  PLT 280 267   No results for input(s): NA, K, CL, CO2, GLUCOSE, BUN, CREATININE, CALCIUM in the last 72 hours.  Physical Exam: BP (!) 115/47 (BP Location: Right Arm)   Pulse (!) 52   Temp 98 F (36.7 C) (Oral)   Resp 18   Ht 6' (1.829 m)   Wt 108.4 kg   SpO2 97%   BMI 32.41 kg/m  Constitutional: No distress . Vital signs reviewed. HENT: Normocephalic.  Atraumatic. Eyes: EOMI. No discharge. Cardiovascular: No JVD. Respiratory: Normal effort.  No stridor. GI: Non-distended. GU: + Foley Skin: Sacral DTI not examined today.  Warm and dry. Psych: Normal mood.  Normal behavior. Musc: No edema in extremities.  No tenderness in extremities. Neuro: Alert Aware of deficits Motor:  RUE: Shoulder abduction, elbow flexion 5/5, wrist extension 4+/5, elbow ext 4+/5, distally 0/5, unchanged LUE: Shoulder abduction, elbow flexion 5/5, wrist extension 4+/5, elbow ext 4/5, hand grip 0/5, unchanged B/l LE: 0/5 proxima to distal, stable  Assessment/Plan: 1. Functional deficits secondary to NMO/transverse myelitis which require 3+ hours per day of interdisciplinary therapy in a comprehensive inpatient rehab setting.  Physiatrist is providing close team supervision and 24 hour management of active medical problems listed below.  Physiatrist and rehab team continue to assess barriers to discharge/monitor patient progress toward  functional and medical goals  Care Tool:  Bathing    Body parts bathed by patient: Right arm, Left arm, Chest, Abdomen, Right upper leg, Left upper leg, Face   Body parts bathed by helper: Buttocks, Right lower leg, Left lower leg     Bathing assist Assist Level: Moderate Assistance - Patient 50 - 74%     Upper Body Dressing/Undressing Upper body dressing Upper body dressing/undressing activity did not occur (including orthotics): N/A What is the patient wearing?: Pull over shirt    Upper body assist Assist Level: Minimal Assistance - Patient > 75%    Lower Body Dressing/Undressing Lower body dressing      What is the patient wearing?: Pants, Incontinence brief     Lower body assist Assist for lower body dressing: Total Assistance - Patient < 25%     Toileting Toileting    Toileting assist Assist for toileting: Dependent - Patient 0%     Transfers Chair/bed transfer  Transfers assist     Chair/bed transfer assist level: Dependent - mechanical lift     Locomotion Ambulation   Ambulation assist   Ambulation activity did not occur: Safety/medical concerns (quadriplegia, decreased balance/postural control)          Walk 10 feet activity   Assist  Walk 10 feet activity did not occur: Safety/medical concerns (quadriplegia, decreased balance/postural control)        Walk 50 feet activity   Assist Walk 50 feet with 2 turns activity did not occur: Safety/medical concerns (quadriplegia, decreased balance/postural control)         Walk 150  feet activity   Assist Walk 150 feet activity did not occur: Safety/medical concerns (quadriplegia, decreased balance/postural control)         Walk 10 feet on uneven surface  activity   Assist Walk 10 feet on uneven surfaces activity did not occur: Safety/medical concerns (quadriplegia, decreased balance/postural control)         Wheelchair     Assist Will patient use wheelchair at discharge?:  Yes Type of Wheelchair: Power    Wheelchair assist level: Supervision/Verbal cueing Max wheelchair distance: >559ft    Wheelchair 50 feet with 2 turns activity    Assist        Assist Level: Supervision/Verbal cueing   Wheelchair 150 feet activity     Assist     Assist Level: Supervision/Verbal cueing      Medical Problem List and Plan: 1.  Quadriparesis secondary to transverse myelitis/NMO.  IV IG completed 03/20/2020 as well as received Rituxan dose 03/24/2020, repeat dose on 7/20.   Continue CIR  B/L PRAFO nightly  Will consider bilateral tenodesis plans after discussion with therapies, await feedback from primary therapist 2.  Antithrombotics: -DVT/anticoagulation: Lovenox                Vascular study negative for DVT             -antiplatelet therapy: Aspirin 81 mg daily 3. Pain Management: Lidoderm patch, tramadol as needed  Controlled on 7/22  Monitor with increased exertion 4. Mood: Zoloft 150 mg nightly, Ativan as needed             -antipsychotic agents: N/A 5. Neuropsych: This patient is capable of making decisions on his own behalf. 6. Skin/Wound Care: Routine skin checks  Air mattress ordered  Continue to encourage pressure-relief 7. Fluids/Electrolytes/Nutrition: Routine in and outs.  8.  Diabetes mellitus type 2 with hyperglycemia.  Hemoglobin A1c 6.7.    On Jardiance 12.5 mg daily, Metformin 1000 mg twice daily, Lantus 30 units, NovoLog 25-30 units 3 times daily PTA  Lantus insulin 30 units daily  Metformin 500 twice daily resumed on 7/22  Jardiance 10 started on 7/16  Elevated on 7/22 9. HCAP/OSA.  Completed course of Maxipime initiated 03/18/2020 x14 doses.  Continue BiPAP 10. Hyperlipidemia. Lipitor/Vascepa 11. Hypertension.  Lisinopril 20 mg daily  Controlled on 7/22  Monitor with increased mobility 12.  Transaminitis  LFTs elevated, but improving on 7/16  Continue to monitor 13.  Leukopenia- medication induced  WBCs 3.6 on  7/22  Discussed with pharmacy - [place] WNL  Continue to monitor 14.  Acute blood loss anemia  Hemoglobin 11.9 on 7/22  Continue to monitor 10.  Neurogenic bowel/bladder  Indwelling Foley due to traumatic I/O caths, DC Foley with I/O caths  Bowel program:    -miralax stopped   -added probiotic 7/17   -PM suppository DC'd on 7/19   -metamucil d/ced   Continue digital stimulation   Patient having daily BMs  LOS: 15 days A FACE TO FACE EVALUATION WAS PERFORMED  Michal Strzelecki Lorie Phenix 04/09/2020, 9:09 AM

## 2020-04-10 ENCOUNTER — Ambulatory Visit (HOSPITAL_COMMUNITY): Payer: Medicare Other

## 2020-04-10 ENCOUNTER — Inpatient Hospital Stay (HOSPITAL_COMMUNITY): Payer: Medicare Other | Admitting: Occupational Therapy

## 2020-04-10 ENCOUNTER — Encounter (HOSPITAL_COMMUNITY): Payer: Medicare Other | Admitting: Occupational Therapy

## 2020-04-10 LAB — GLUCOSE, CAPILLARY
Glucose-Capillary: 102 mg/dL — ABNORMAL HIGH (ref 70–99)
Glucose-Capillary: 190 mg/dL — ABNORMAL HIGH (ref 70–99)
Glucose-Capillary: 130 mg/dL — ABNORMAL HIGH (ref 70–99)
Glucose-Capillary: 175 mg/dL — ABNORMAL HIGH (ref 70–99)

## 2020-04-10 MED ORDER — LISINOPRIL 10 MG PO TABS
10.0000 mg | ORAL_TABLET | Freq: Every day | ORAL | Status: DC
  Administered 2020-04-10 – 2020-04-22 (×13): 10 mg via ORAL
  Filled 2020-04-10 (×12): qty 1

## 2020-04-10 NOTE — Progress Notes (Signed)
Patient ID: Brent [name]., male   DOB: [birth year], 74 y.o.   MRN: [id number]  SW received phone call from pt pastor/friend Brent [name] [phone])- SW confirmed with nursing that he is on the visitor list. SW and [place] and Erline Levine present for call. [name] reported that he spoke with the patient's wife earlier today and she expressed being very overwhelmed with the amount of care that he will require and stated to Abbe Amsterdam that she will "end it all." The 72 y.o. grandson has indicated that he will commit suicide as well if anything happens to either one of his grandparents, but before he commits suicide, he plans to grab a knife and take out a few people with him. Abbe Amsterdam believes this is a hostile living environment, and does not believe pt should return. It was discussed that SNF is an option for him, however, the wife had asked medical team to not discuss with him. It was also reiterated that pt must be in agreement with SNF. He intends to speak with patient further about d/c plan.   SNF referrals were already sent out. D/c plan pending.   Loralee Pacas, MSW, [place] Office: [phone] Cell: [phone] Fax: [phone]

## 2020-04-10 NOTE — Progress Notes (Signed)
Occupational Therapy Session Note  Patient Details  Name: Brent [name]. MRN: [id number] Date of Birth: 1946-05-15  Today's Date: 04/10/2020 OT Individual Time: 3664-4034 OT Individual Time Calculation (min): 74 min    Short Term Goals: Week 3:  OT Short Term Goal 1 (Week 3): Pt will don pants at bed level with max assist, utilizing circle sitting/figure 4 and AE as needed. OT Short Term Goal 2 (Week 3): Pt will maintain upright sitting position with CGA for 2 mins to demonstrate increased postural control as needed for UB dressing OT Short Term Goal 3 (Week 3): Pt will direct lift transfer with mod cues  Skilled Therapeutic Interventions/Progress Updates:    Treatment session with focus on static and dynamic sitting balance and circle sitting to increase success with LB bathing and dressing tasks.  Pt received upright in power w/c on phone with wife.  Post phone call, pt asking therapist questions regarding d/c plan and why there may be changes in recommendations.  Discussed current progress to this point and hopeful for more return post infusion 7/20 while also considering the enormity of the situation and the ability for his wife to provide the level of care he may require.  Pt continues to report hopeful that he will continue to make improvements.  Placed sling for maximove lift with +2 with therapist positioned in front to facilitate weight shift while 2nd person assisted with placement of sling.  Dependent transfer to therapy mat.  Engaged in circle sitting on therapy mat with pt requiring mod A +2 initially to obtain upright sitting posture.  Pt able to maintain sitting balance 7-8 mins with CGA for trunk control while engaged in simulated LB dressing tasks to include attempts to doff shoes.  Pt unable to maintain grasp on AE to assist with LB dressing, therefore discussed focus on bathing and donning pants but most likely requiring assist for TEDS and shoes.  Engaged in alternating  propped on BUE slightly behind trunk to hands placed in front in tripod sitting with pt requiring mod assist to weight shift to transition hand placement forward and backwards.  Discussed recommendation for hospital bed for home to assist in LB bathing and dressing to provide more supported upright sitting posture.  Returned to room and transferred back to bed via lift.  Pt incontinent of bowel during transfer back to bed, notified RN of need for hygiene.  Therapy Documentation Precautions:  Precautions Precautions: Fall Precaution Comments: quadriplegia Restrictions Weight Bearing Restrictions: No Pain:  Pt with c/o pain in Bilateral upper traps, premedicated and repositioned.   Therapy/Group: Individual Therapy  Simonne Come 04/10/2020, 3:17 PM

## 2020-04-10 NOTE — Progress Notes (Addendum)
Mobility Assessment  Patient was seen today (face to face) for the purpose of a mobility assessment for a powered wheelchair (please see progress notes as well). Patient suffers from loss of mobility and agility related to NMP. Due to NMO, patient is unable to utilize a cane, walker, manual wheelchair, or scooter. The patient is appropriate for a customized power wheelchair. With a power wheelchair  Patient can move independently at a household level and on a limited basis in the community. The chair will also allow the patient to perform ADL's more easily as well as pressure relief given his sacral ulcer. The patient is competent to operate the recommended chair on his own and is motivated to utilize the chair on a daily basis.

## 2020-04-10 NOTE — Progress Notes (Signed)
Occupational Therapy Weekly Progress Note  Patient Details  Name: Brent [name]. MRN: [id number] Date of Birth: 1946/04/04  Beginning of progress report period: April 03, 2020 End of progress report period: April 10, 2020  Today's Date: 04/10/2020 OT Individual Time: 1002-1100 OT Individual Time Calculation (min): 58 min    Patient has met 2 of 4 short term goals.  Pt is making minimal progress towards goals.  Pt progress with transfers has been limited secondary to pressure injuries on sacrum, therefore have begun focusing on transfers via mechanical lift.  Pt is able to follow directions and rolls with as little as mod assist to allow for placement of sling, but is not taking an active part is directing care with transfers or any self-care to this point.  Pt is able to wash upper body with use of wash mit after setup and requires min-mod assist for UB dressing.  Pt continues to require total assist for LB hygiene, toileting, and dressing at bed level.  Pt continues to have incontinent bowel movements with minimal awareness.  Focus has been placed on circle sitting and figure 4 with UB support from hospital bed to increase participation in LB dressing/bathing.  Pt is able to verbalize pressure relief schedule and demonstrates technique in power w/c without cues.  Pt continues to require Mod assist to obtain static sitting balance and is able to maintain for 1-2 mins with cues.  Pt continues to c/o mild dizziness when upright in unsupported position, pt is wearing thigh high TEDS to help with BP.  Patient continues to demonstrate the following deficits: muscle weakness and muscle paralysis,decreased cardiorespiratoy endurance,abnormal tone, unbalanced muscle activation and motor apraxiaand decreased sitting balance and decreased postural control and therefore will continue to benefit from skilled OT intervention to enhance overall performance with BADL and Reduce care partner burden.  Patient  not progressing toward long term goals.  Plan to consider revising after additional week post round of Rituxan.  Continue plan of care.  OT Short Term Goals Week 2:  OT Short Term Goal 1 (Week 2): Pt will complete LB bathing at bed level, utilizing circle sitting/figure 4 and AE as needed, with mod assist OT Short Term Goal 1 - Progress (Week 2): Met OT Short Term Goal 2 (Week 2): Pt will don pants at bed level with max assist, utilizing circle sitting/figure 4 and AE as needed. OT Short Term Goal 2 - Progress (Week 2): Progressing toward goal OT Short Term Goal 3 (Week 2): Pt will maintain upright sitting position with CGA for 2 mins to demonstrate increased postural control as needed for UB dressing OT Short Term Goal 3 - Progress (Week 2): Progressing toward goal OT Short Term Goal 4 (Week 2): Pt will monitor and direct weight shifts when OOB with min cues OT Short Term Goal 4 - Progress (Week 2): Met Week 3:  OT Short Term Goal 1 (Week 3): Pt will don pants at bed level with max assist, utilizing circle sitting/figure 4 and AE as needed. OT Short Term Goal 2 (Week 3): Pt will maintain upright sitting position with CGA for 2 mins to demonstrate increased postural control as needed for UB dressing OT Short Term Goal 3 (Week 3): Pt will direct lift transfer with mod cues  Skilled Therapeutic Interventions/Progress Updates:    Treatment session with focus on use of BUE to complete grooming tasks, UB bathing and dressing, and pressure relief.  Pt's wife not present for family education with  focus on preparing and discussing D/C needs and possible level of care.  Pt received upright in w/c reporting desire to completed oral care and change shirts.  Pt maneuvered power w/c to sink and was able to reach items on sink with increased trunk control when reaching forward.  Provided pt with built up handle for toothbrush to increase success with oral care.  Pt able to manage cup and spit basin with increased  success this session.  Pt doffed shirt with assist to weight shift forward and pull shirt up to head to then allow pt to pull shirt overhead and off arms.  Pt able to doff shirt with increased success with threading arms and over head.  Pt maneuvered power w/c to therapy gym without assistance.  Engaged in table top reaching activity with focus on grasp and use of BUE together when reaching and maneuvering items on vertical magnetic surface. Pt demonstrating increased success with manipulation of hands to remove items, but still no functional grasp at this time.  Returned to room and left upright in w/c with seat belt alarm on and all needs in reach.  Encouraged pt to complete pressure relief per protocol as therapist exiting.  Therapy Documentation Precautions:  Precautions Precautions: Fall Precaution Comments: quadriplegia Restrictions Weight Bearing Restrictions: No Pain:  Pt with no c/o pain   Therapy/Group: Individual Therapy  Simonne Come 04/10/2020, 9:38 AM

## 2020-04-10 NOTE — Progress Notes (Signed)
Physical Therapy Session Note  Patient Details  Name: Brent [name]. MRN: [id number] Date of Birth: [birth year]  Today's Date: 04/10/2020 PT Individual Time: 0900-0957 PT Individual Time Calculation (min): 57 min   Short Term Goals: Week 2:  PT Short Term Goal 1 (Week 2): Pt will perform rolling L/R with mod A x 1 PT Short Term Goal 1 - Progress (Week 2): Met PT Short Term Goal 2 (Week 2): Pt will initiate power wheelchair mobility PT Short Term Goal 2 - Progress (Week 2): Met PT Short Term Goal 3 (Week 2): Pt will maintain static sitting balance x 5 min with CGA PT Short Term Goal 3 - Progress (Week 2): Progressing toward goal Week 3:  PT Short Term Goal 1 (Week 3): Pt will maintain static sitting balance x 5 min with CGA PT Short Term Goal 2 (Week 3): Pt will begin to verbalize instruction for lift transfers PT Short Term Goal 3 (Week 3): Pt will demonstrate power tilt and recline features  Skilled Therapeutic Interventions/Progress Updates:   Received pt supine in bed, pt agreeable to therapy, and reported pain in L upper trapezius but declined pain interventions due to side effects of fatigue. Pt's wife not present for family education training; case manager notified. Session with emphasis on discharge planning, dressing, functional mobility/transfers, power WC mobility, dynamic sitting balance/coordination, trunk control, and improved activity tolerance. Doffed bilateral PRAFO boots total A and donned ted hose and shoes total A. Pt rolled to L and R with mod A and required total A to don brief. Donned pants in supine with total A and rolled to L and R again with mod A and required total A to pull pants over hips. Obtained Hoyer lift and educated pt on expectations for Cook lift if planning to D/C home. Pt able to loop UEs through bedrails and sit forward with mod A fading to CGA and required +2 assist to position Hoyer sling while supine in bed. Pt transferred bed<>power WC dependently  via hoyer lift with +2 assist for education purposes and due to sacral wound. Pt required max A +2 to reposition hips in chair. Discussion had regarding D/C options and pt again stated that he needs time to discuss SNF vs home care options with his wife. Pt with questions regarding what the process looks like if he goes home and then realizes it is too much work for his famil and needs to then transfer to facility. Case manager notified and will provide pt with more information. Concluded session with pt semi-reclined in power WC, needs within reach, and seatbelt alarm and power WC seatbelt on.   Therapy Documentation Precautions:  Precautions Precautions: Fall Precaution Comments: quadriplegia Restrictions Weight Bearing Restrictions: No  Therapy/Group: Individual Therapy Alfonse Alpers PT, DPT   04/10/2020, 7:23 AM

## 2020-04-10 NOTE — NC FL2 (Signed)
[place] LEVEL OF CARE SCREENING TOOL     IDENTIFICATION  Patient Name: Brent [name]. Birthdate: [birth year] Sex: male Admission Date (Current Location): 03/25/2020  [facility] and [place] Number:  Herbalist and Address:  [facility], [place] [address] [place], [place] [address]      Provider Number: [id number]  Attending Physician Name and Address:  Jamse Arn, MD  Relative Name and Phone Number:  Thedford Bunton (wife):[phone]    Current Level of Care: Hospital Recommended Level of Care: Nursing Facility Prior Approval Number:    Date Approved/Denied:   PASRR Number: [id number]  Discharge Plan: SNF    Current Diagnoses: Patient Active Problem List   Diagnosis Date Noted  . Pressure injury of skin 04/09/2020  . 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 Transverse myelitis ([place])   . Labile blood pressure   . Labile blood glucose   . Neurogenic bladder   . Neurogenic bowel   . Acute blood loss anemia   . Leukopenia   . Transaminitis   . Controlled type 2 diabetes mellitus with hyperglycemia, with long-term current use of insulin ([place])   . Spinal cord infarction ([place]) 03/25/2020  . Quadriplegia and quadriparesis ([place])   . Dyslipidemia   . HCAP (healthcare-associated pneumonia)   . Acute on chronic respiratory failure with hypoxia ([place])   . Acute hypoxemic respiratory failure ([place])   . Quadriplegia ([place])   . Coronary artery disease involving native coronary artery of native heart without angina pectoris   . Steroid-induced hyperglycemia   . Diabetic peripheral neuropathy ([place])   . Tachypnea   . Hyponatremia   . AKI (acute kidney injury) ([place])   . Weakness 03/09/2020  . Chest pain 03/08/2020  . Right leg numbness   . Malignant melanoma of left side of neck ([place]) 10/25/2018  . Facial neuritis 10/25/2018  . Myofascial pain syndrome 10/25/2018  . Occipital neuralgia of left side 10/25/2018  . CAP (community  acquired pneumonia) 09/08/2017  . Viral URI with cough 08/02/2017  . Male hypogonadism 06/20/2016  . Renal lesion 06/20/2016  . Insomnia 08/07/2015  . Enlarged prostate with lower urinary tract symptoms (LUTS) 11/07/2014  . Balanitis 07/30/2012  . Bladder neck obstruction 07/30/2012  . Prostate nodule 06/18/2012  . Recurrent nephrolithiasis 06/18/2012  . Urinary urgency 06/18/2012  . Benign neoplasm of colon 08/29/2011  . DEPRESSION, SITUATIONAL, ACUTE 01/23/2010  . Essential hypertension 05/30/2009  . Coronary atherosclerosis 05/30/2009  . Obstructive sleep apnea 11/28/2008  . OBESITY 09/23/2008  . ERECTILE DYSFUNCTION 11/26/2007  . Well controlled type 2 diabetes mellitus with peripheral circulatory disorder ([place]) 05/09/2007  . Hyperlipidemia 05/09/2007  . MYOCARDIAL INFARCTION, HX OF 05/09/2007  . DIVERTICULOSIS, COLON 05/09/2007    Orientation RESPIRATION BLADDER Height & Weight     Self, Time, Situation, Place  Normal Incontinent (Neurogenic bladder; q4 intermittent catheters 63fr) Weight: 238 lb 15.7 oz (108.4 kg) Height:  6' (182.9 cm)  BEHAVIORAL SYMPTOMS/MOOD NEUROLOGICAL BOWEL NUTRITION STATUS      Incontinent (neurogenic bowel; bowel program with suppository at 6pm.) Diet (carb modified medium diet)  AMBULATORY STATUS COMMUNICATION OF NEEDS Skin   Total Care Verbally Other (Comment) (Stage 3 Coccyx- Santyl with mepilex foam dressing daily)                       Personal Care Assistance Level of Assistance  Total care Bathing Assistance: Maximum assistance     Total  Care Assistance: Maximum assistance   Functional Limitations Info             SPECIAL CARE FACTORS FREQUENCY  PT (By licensed PT), OT (By licensed OT)     PT Frequency: 5xs per week OT Frequency: 5xs per week            Contractures Contractures Info: Not present    Additional Factors Info  Code Status (Lantus- long acting insulin 30units daily) Code Status Info: Full              Current Medications (04/10/2020):  This is the current hospital active medication list Current Facility-Administered Medications  Medication Dose Route Frequency Provider Last Rate Last Admin  . acetaminophen (TYLENOL) tablet 650 mg  650 mg Oral Q6H PRN Jamse Arn, MD   650 mg at 03/28/20 1102  . albuterol (PROVENTIL) (2.5 MG/3ML) 0.083% nebulizer solution 2.5 mg  2.5 mg Nebulization Once PRN Angiulli, Lavon Paganini, PA-C      . aspirin EC tablet 81 mg  81 mg Oral QHS Cathlyn Parsons, PA-C   81 mg at 04/09/20 2130  . atorvastatin (LIPITOR) tablet 20 mg  20 mg Oral Daily AngiulliLavon Paganini, PA-C   20 mg at 04/10/20 1147  . Chlorhexidine Gluconate Cloth 2 % PADS 6 each  6 each Topical Q0600 Jamse Arn, MD   6 each at 04/09/20 [phone]  . collagenase (SANTYL) ointment   Topical Daily Jamse Arn, MD   Given at 04/09/20 [phone]  . diphenhydrAMINE (BENADRYL) injection 50 mg  50 mg Intravenous Once PRN Jamse Arn, MD      . empagliflozin (JARDIANCE) tablet 10 mg  10 mg Oral Daily Jamse Arn, MD   10 mg at 04/10/20 1146  . enoxaparin (LOVENOX) injection 50 mg  50 mg Subcutaneous Q24H AngiulliLavon Paganini, PA-C   50 mg at 04/10/20 1147  . EPINEPHrine (ADRENALIN) 0.3 mg  0.3 mg Intramuscular Q5 min PRN Jamse Arn, MD      . famotidine (PEPCID) IVPB 20 mg premix  20 mg Intravenous Once PRN Jamse Arn, MD      . fenofibrate tablet 160 mg  160 mg Oral QHS Cathlyn Parsons, PA-C   160 mg at 04/09/20 2129  . fluticasone (FLONASE) 50 MCG/ACT nasal spray 2 spray  2 spray Each Nare BID Meredith Staggers, MD   2 spray at 04/09/20 2130  . icosapent Ethyl (VASCEPA) 1 g capsule 2 g  2 g Oral BID AngiulliLavon Paganini, PA-C   2 g at 04/10/20 1146  . insulin aspart (novoLOG) injection 0-20 Units  0-20 Units Subcutaneous TID WC Cathlyn Parsons, PA-C   4 Units at 04/09/20 1834  . insulin glargine (LANTUS) injection 30 Units  30 Units Subcutaneous Daily Cathlyn Parsons,  PA-C   30 Units at 04/10/20 1146  . lidocaine (LIDODERM) 5 % 1 patch  1 patch Transdermal Q24H Cathlyn Parsons, PA-C   1 patch at 04/09/20 2133  . lisinopril (ZESTRIL) tablet 10 mg  10 mg Oral Daily Jamse Arn, MD   10 mg at 04/10/20 1148  . loperamide (IMODIUM) capsule 2 mg  2 mg Oral PRN AngiulliLavon Paganini, PA-C   2 mg at 03/31/20 1045  . LORazepam (ATIVAN) tablet 0.5 mg  0.5 mg Oral Q4H PRN Angiulli, Lavon Paganini, PA-C      . LORazepam (ATIVAN) tablet 1 mg  1 mg Oral QHS PRN  Cathlyn Parsons, PA-C   1 mg at 04/09/20 2129  . methylPREDNISolone sodium succinate (SOLU-MEDROL) 125 mg/2 mL injection 125 mg  125 mg Intravenous Once PRN Jamse Arn, MD      . multivitamin with minerals tablet 1 tablet  1 tablet Oral Daily Cathlyn Parsons, PA-C   1 tablet at 04/10/20 1147  . nystatin (MYCOSTATIN/NYSTOP) topical powder 1 application  1 application Topical TID Cathlyn Parsons, PA-C   1 application at 56/31/49 2130  . oxybutynin (DITROPAN) tablet 5 mg  5 mg Oral BID Cathlyn Parsons, PA-C   5 mg at 04/10/20 1147  . pantoprazole (PROTONIX) EC tablet 40 mg  40 mg Oral Daily Cathlyn Parsons, PA-C   40 mg at 04/10/20 1148  . saccharomyces boulardii (FLORASTOR) capsule 250 mg  250 mg Oral BID Meredith Staggers, MD   250 mg at 04/10/20 1147  . sertraline (ZOLOFT) tablet 150 mg  150 mg Oral QHS Cathlyn Parsons, PA-C   150 mg at 04/09/20 2128  . sodium chloride 0.9 % bolus 1,000 mL  1,000 mL Intravenous Once PRN Jamse Arn, MD      . sodium phosphate (FLEET) 7-19 GM/118ML enema 1 enema  1 enema Rectal Daily PRN Angiulli, Lavon Paganini, PA-C      . traMADol Veatrice [place]bon) tablet 50 mg  50 mg Oral Q6H PRN Cathlyn Parsons, PA-C   50 mg at 04/09/20 2129  . vitamin B-12 (CYANOCOBALAMIN) tablet 500 mcg  500 mcg Oral Daily Cathlyn Parsons, PA-C   500 mcg at 04/10/20 1148     Discharge Medications: Please see discharge summary for a list of discharge medications.  Relevant Imaging  Results:  Relevant Lab Results:   Additional Information SS#: [id number]  Rana Snare, LCSW

## 2020-04-10 NOTE — Care Management (Signed)
Patient ID: Brent Trow., male   DOB: [birth year], 74 y.o.   MRN: [id number]   Called the wife to review follow up from team conference andclarify scheduled family education. Reviewed with the wife that therapy had recommended family education with the wife/patient at team conference on Wednesday and had not seen her this morning. Clarified with the wife that she needed to schedule a sitter for the grand-son in order for her to be available. The best time for education sessions would be late mornings and early afternoons. She would work on a Actuary for next week. Also reviewed options for discharge available her and the patient, if assistance for caregivers were not available to assist her with the care at the home. Wife noted she wanted the best care possible for the patient and needed some time to process the situation and speak with the patient about how they wanted to proceed. Reviewed the process for SNF placement and insurance coverage for SNF as a possible discharge option. Wife noted she was initially given 04/30/20 as a potential discharge date and wanted to see how the patient progressed between now and then before making a decision for discharge destination. Requested a call back early next week to review family education times. [name]

## 2020-04-10 NOTE — Progress Notes (Signed)
[place] PHYSICAL MEDICINE & REHABILITATION PROGRESS NOTE  Subjective/Complaints: Patient seen laying in bed this morning.  He states he slept well overnight.  He states he had a BM yesterday evening.  He states he is tolerating in and out caths.  ROS: Denies CP, SOB, N/V/D  Objective: Vital Signs: Blood pressure (!) 105/44, pulse 52, temperature 97.8 F (36.6 C), resp. rate 17, height 6' (1.829 m), weight 108.4 kg, SpO2 100 %. No results found. Recent Labs    04/09/20 0552  WBC 3.6*  HGB 11.9*  HCT 37.9*  PLT 267   No results for input(s): NA, K, CL, CO2, GLUCOSE, BUN, CREATININE, CALCIUM in the last 72 hours.  Physical Exam: BP (!) 105/44 (BP Location: Left Arm)   Pulse 52   Temp 97.8 F (36.6 C)   Resp 17   Ht 6' (1.829 m)   Wt 108.4 kg   SpO2 100%   BMI 32.41 kg/m  Constitutional: No distress . Vital signs reviewed. HENT: Normocephalic.  Atraumatic. Eyes: EOMI. No discharge. Cardiovascular: No JVD. Respiratory: Normal effort.  No stridor. GI: Non-distended. Skin: Sacral DTI not examined today.  Warm and dry. Psych: Normal mood.  Normal behavior. Musc: No edema in extremities.  No tenderness in extremities. Neuro: Alert Aware of deficits Motor:  RUE: Shoulder abduction, elbow flexion 5/5, wrist extension 4+/5, elbow ext 4+/5, distally 0/5, stable LUE: Shoulder abduction, elbow flexion 5/5, wrist extension 4+/5, elbow ext 4/5, hand grip 0/5, stable B/l LE: 0/5 proxima to distal, stable  Assessment/Plan: 1. Functional deficits secondary to NMO/transverse myelitis which require 3+ hours per day of interdisciplinary therapy in a comprehensive inpatient rehab setting.  Physiatrist is providing close team supervision and 24 hour management of active medical problems listed below.  Physiatrist and rehab team continue to assess barriers to discharge/monitor patient progress toward functional and medical goals  Care Tool:  Bathing    Body parts bathed by  patient: Right arm, Left arm, Chest, Abdomen, Right upper leg, Left upper leg, Face   Body parts bathed by helper: Buttocks     Bathing assist Assist Level: Moderate Assistance - Patient 50 - 74%     Upper Body Dressing/Undressing Upper body dressing Upper body dressing/undressing activity did not occur (including orthotics): N/A What is the patient wearing?: Pull over shirt    Upper body assist Assist Level: Minimal Assistance - Patient > 75%    Lower Body Dressing/Undressing Lower body dressing      What is the patient wearing?: Pants, Incontinence brief     Lower body assist Assist for lower body dressing: Total Assistance - Patient < 25%     Toileting Toileting    Toileting assist Assist for toileting: Dependent - Patient 0%     Transfers Chair/bed transfer  Transfers assist     Chair/bed transfer assist level: Dependent - mechanical lift     Locomotion Ambulation   Ambulation assist   Ambulation activity did not occur: Safety/medical concerns (quadriplegia, decreased balance/postural control)          Walk 10 feet activity   Assist  Walk 10 feet activity did not occur: Safety/medical concerns (quadriplegia, decreased balance/postural control)        Walk 50 feet activity   Assist Walk 50 feet with 2 turns activity did not occur: Safety/medical concerns (quadriplegia, decreased balance/postural control)         Walk 150 feet activity   Assist Walk 150 feet activity did not occur: Safety/medical concerns (quadriplegia,  decreased balance/postural control)         Walk 10 feet on uneven surface  activity   Assist Walk 10 feet on uneven surfaces activity did not occur: Safety/medical concerns (quadriplegia, decreased balance/postural control)         Wheelchair     Assist Will patient use wheelchair at discharge?: Yes Type of Wheelchair: Power    Wheelchair assist level: Supervision/Verbal cueing Max wheelchair  distance: >571ft    Wheelchair 50 feet with 2 turns activity    Assist        Assist Level: Supervision/Verbal cueing   Wheelchair 150 feet activity     Assist     Assist Level: Supervision/Verbal cueing      Medical Problem List and Plan: 1.  Quadriparesis secondary to transverse myelitis/NMO.  IV IG completed 03/20/2020 as well as received Rituxan dose 03/24/2020, repeat dose on 7/20.   Continue CIR  B/L PRAFO nightly  Will consider bilateral tenodesis plans after discussion with therapies, await feedback from primary therapist 2.  Antithrombotics: -DVT/anticoagulation: Lovenox                Vascular study negative for DVT             -antiplatelet therapy: Aspirin 81 mg daily 3. Pain Management: Lidoderm patch, tramadol as needed  Controlled on 7/23  Monitor with increased exertion 4. Mood: Zoloft 150 mg nightly, Ativan as needed             -antipsychotic agents: N/A 5. Neuropsych: This patient is capable of making decisions on his own behalf. 6. Skin/Wound Care: Routine skin checks  Air mattress ordered  Continue to encourage pressure-relief  WOC following 7. Fluids/Electrolytes/Nutrition: Routine in and outs.   Labs ordered for Monday. 8.  Diabetes mellitus type 2 with hyperglycemia.  Hemoglobin A1c 6.7.    On Jardiance 12.5 mg daily, Metformin 1000 mg twice daily, Lantus 30 units, NovoLog 25-30 units 3 times daily PTA  Lantus insulin 30 units daily  Metformin 500 twice daily resumed on 7/22  Jardiance 10 started on 7/16  Labile on 7/23 9. HCAP/OSA.  Completed course of Maxipime initiated 03/18/2020 x14 doses.  Continue BiPAP 10. Hyperlipidemia. Lipitor/Vascepa 11. Hypertension.  Lisinopril 20 mg daily, decreased to 10 on 7/23  Soft on 7/23  Monitor with increased mobility 12.  Transaminitis  LFTs elevated, but improving on 7/16  Continue to monitor 13.  Leukopenia- medication induced  WBCs 3.6 on 7/22  Discussed with pharmacy - [facility] WNL  Continue to  monitor 14.  Acute blood loss anemia  Hemoglobin 11.9 on 7/22  Continue to monitor 10.  Neurogenic bowel/bladder  Indwelling Foley due to traumatic I/O caths, DC Foley with I/O caths   Tolerating  Bowel program:    -miralax stopped   -added probiotic 7/17   -PM suppository DC'd on 7/19   -metamucil d/ced   Continue digital stimulation   Controlled  LOS: 16 days A FACE TO FACE EVALUATION WAS PERFORMED  Mildred Tuccillo Lorie Phenix 04/10/2020, 9:00 AM

## 2020-04-11 ENCOUNTER — Inpatient Hospital Stay (HOSPITAL_COMMUNITY): Payer: Medicare Other

## 2020-04-11 LAB — GLUCOSE, CAPILLARY
Glucose-Capillary: 114 mg/dL — ABNORMAL HIGH (ref 70–99)
Glucose-Capillary: 161 mg/dL — ABNORMAL HIGH (ref 70–99)
Glucose-Capillary: 161 mg/dL — ABNORMAL HIGH (ref 70–99)

## 2020-04-11 NOTE — Progress Notes (Signed)
[place] PHYSICAL MEDICINE & REHABILITATION PROGRESS NOTE  Subjective/Complaints: Patient seen sitting up in bed this morning.  He states he slept well overnight.  He states he had a bowel movement last night.  Discussed plan prognosis and discussion with wife.  ROS: Denies CP, SOB, N/V/D  Objective: Vital Signs: Blood pressure (!) 115/50, pulse 54, temperature 98.3 F (36.8 C), temperature source Oral, resp. rate 19, height 6' (1.829 m), weight 108.4 kg, SpO2 97 %. No results found. Recent Labs    04/09/20 0552  WBC 3.6*  HGB 11.9*  HCT 37.9*  PLT 267   No results for input(s): NA, K, CL, CO2, GLUCOSE, BUN, CREATININE, CALCIUM in the last 72 hours.  Physical Exam: BP (!) 115/50 (BP Location: Left Arm)   Pulse 54   Temp 98.3 F (36.8 C) (Oral)   Resp 19   Ht 6' (1.829 m)   Wt 108.4 kg   SpO2 97%   BMI 32.41 kg/m  Constitutional: No distress . Vital signs reviewed. HENT: Normocephalic.  Atraumatic. Eyes: EOMI. No discharge. Cardiovascular: No JVD.  RRR. Respiratory: Normal effort.  No stridor.  Bilateral clear to auscultation GI: Non-distended.  BS +. Skin: Sacral DTI not examined today.  Warm and dry. Psych: Normal mood.  Normal behavior. Musc: No edema in extremities.  No tenderness in extremities. Neuro: Alert Aware of deficits Motor:  RUE: Shoulder abduction, elbow flexion 5/5, wrist extension 4+/5, elbow ext 4+/5, distally 0/5, unchanged LUE: Shoulder abduction, elbow flexion 5/5, wrist extension 4+/5, elbow ext 4/5, hand grip 0/5, unchanged B/l LE: 0/5 proxima to distal, stable  Assessment/Plan: 1. Functional deficits secondary to NMO/transverse myelitis which require 3+ hours per day of interdisciplinary therapy in a comprehensive inpatient rehab setting.  Physiatrist is providing close team supervision and 24 hour management of active medical problems listed below.  Physiatrist and rehab team continue to assess barriers to discharge/monitor patient  progress toward functional and medical goals  Care Tool:  Bathing    Body parts bathed by patient: Right arm, Left arm, Chest, Abdomen, Face   Body parts bathed by helper: Buttocks     Bathing assist Assist Level: Contact Guard/Touching assist (UB)     Upper Body Dressing/Undressing Upper body dressing Upper body dressing/undressing activity did not occur (including orthotics): N/A What is the patient wearing?: Pull over shirt    Upper body assist Assist Level: Minimal Assistance - Patient > 75%    Lower Body Dressing/Undressing Lower body dressing      What is the patient wearing?: Pants, Incontinence brief     Lower body assist Assist for lower body dressing: Total Assistance - Patient < 25%     Toileting Toileting    Toileting assist Assist for toileting: Dependent - Patient 0%     Transfers Chair/bed transfer  Transfers assist     Chair/bed transfer assist level: Dependent - mechanical lift     Locomotion Ambulation   Ambulation assist   Ambulation activity did not occur: Safety/medical concerns (quadriplegia, decreased balance/postural control)          Walk 10 feet activity   Assist  Walk 10 feet activity did not occur: Safety/medical concerns (quadriplegia, decreased balance/postural control)        Walk 50 feet activity   Assist Walk 50 feet with 2 turns activity did not occur: Safety/medical concerns (quadriplegia, decreased balance/postural control)         Walk 150 feet activity   Assist Walk 150 feet activity did  not occur: Safety/medical concerns (quadriplegia, decreased balance/postural control)         Walk 10 feet on uneven surface  activity   Assist Walk 10 feet on uneven surfaces activity did not occur: Safety/medical concerns (quadriplegia, decreased balance/postural control)         Wheelchair     Assist Will patient use wheelchair at discharge?: Yes Type of Wheelchair: Power    Wheelchair  assist level: Supervision/Verbal cueing Max wheelchair distance: >568ft    Wheelchair 50 feet with 2 turns activity    Assist        Assist Level: Supervision/Verbal cueing   Wheelchair 150 feet activity     Assist     Assist Level: Supervision/Verbal cueing      Medical Problem List and Plan: 1.  Quadriparesis secondary to transverse myelitis/NMO.  IV IG completed 03/20/2020 as well as received Rituxan dose 03/24/2020, repeat dose on 7/20.   Continue CIR  B/L PRAFO nightly  Will consider bilateral tenodesis plans after discussion with therapies, await feedback from primary therapist  Had prolonged discussion with patient and wife regarding disease, prognosis, functional level, assistance required, caregiver support, patient involvement, etc. also had discussion with social worker regarding her conversation with trusted family friends regarding high concerns.  Patient plans to discuss with his wife and determine plan. 2.  Antithrombotics: -DVT/anticoagulation: Lovenox                Vascular study negative for DVT             -antiplatelet therapy: Aspirin 81 mg daily 3. Pain Management: Lidoderm patch, tramadol as needed  Controlled on 7/24  Monitor with increased exertion 4. Mood: Zoloft 150 mg nightly, Ativan as needed             -antipsychotic agents: N/A 5. Neuropsych: This patient is capable of making decisions on his own behalf. 6. Skin/Wound Care: Routine skin checks  Air mattress ordered  Continue to encourage pressure-relief  WOC following 7. Fluids/Electrolytes/Nutrition: Routine in and outs.   Labs ordered for Monday. 8.  Diabetes mellitus type 2 with hyperglycemia.  Hemoglobin A1c 6.7.    On Jardiance 12.5 mg daily, Metformin 1000 mg twice daily, Lantus 30 units, NovoLog 25-30 units 3 times daily PTA  Lantus insulin 30 units daily  Metformin 500 twice daily resumed on 7/22  Jardiance 10 started on 7/16  Labile on 7/24 9. HCAP/OSA.  Completed course of  Maxipime initiated 03/18/2020 x14 doses.  Continue BiPAP 10. Hyperlipidemia. Lipitor/Vascepa 11. Hypertension.  Lisinopril 20 mg daily, decreased to 10 on 7/23  Controlled on 7/24  Monitor with increased mobility 12.  Transaminitis  LFTs elevated, but improving on 7/16  Continue to monitor 13.  Leukopenia- medication induced  WBCs 3.6 on 7/22, labs ordered for Monday  Discussed with pharmacy - [place] WNL  Continue to monitor 14.  Acute blood loss anemia  Hemoglobin 11.9 on 7/22, labs ordered for Monday  Continue to monitor 10.  Neurogenic bowel/bladder  Indwelling Foley due to traumatic I/O caths, DC Foley with I/O caths   Tolerating  Bowel program:    -miralax stopped   -added probiotic 7/17   -PM suppository DC'd on 7/19   -metamucil d/ced   Continue digital stimulation   Having regular bowel movements  Greater than 35 minutes spent in total in counseling and coordination of care with social worker, wife, and patient.  Please see above.  LOS: 17 days A FACE TO FACE EVALUATION WAS  PERFORMED  Mery Guadalupe Lorie Phenix 04/11/2020, 9:59 AM

## 2020-04-11 NOTE — Progress Notes (Signed)
Physical Therapy Session Note  Patient Details  Name: Brent F Fenter Jr. MRN: [id number] Date of Birth: 08/07/1946  Today's Date: 04/11/2020 PT Individual Time: 0905-0945 PT Individual Time Calculation (min): 40 min   Short Term Goals: Week 2:  PT Short Term Goal 1 (Week 2): Pt will perform rolling L/R with mod A x 1 PT Short Term Goal 1 - Progress (Week 2): Met PT Short Term Goal 2 (Week 2): Pt will initiate power wheelchair mobility PT Short Term Goal 2 - Progress (Week 2): Met PT Short Term Goal 3 (Week 2): Pt will maintain static sitting balance x 5 min with CGA PT Short Term Goal 3 - Progress (Week 2): Progressing toward goal Week 3:  PT Short Term Goal 1 (Week 3): Pt will maintain static sitting balance x 5 min with CGA PT Short Term Goal 2 (Week 3): Pt will begin to verbalize instruction for lift transfers PT Short Term Goal 3 (Week 3): Pt will demonstrate power tilt and recline features  Skilled Therapeutic Interventions/Progress Updates:   Received pt supine in bed, pt agreeable to therapy, and continues to report discomfort along L upper trapezius but declined pain medication. Session with emphasis on functional mobility/transfers, toileting, dressing, and improved activity tolerance. Donned bilateral ted hose and shoes with total A while supine in bed. Pt rolled to L and R with mod A and use of bedrails x 5 trials throughout session. Positioned Maximove sling with max A +2. Once sling was positioned, pt reported feeling like he had BM. Rolled L and R with mod A required +2 assist to doff sling and required mod A +2 to doff pants/brief. Pt with small loose BM (RN present and aware). Pt required total A for peri-care and total A +2 to don clean brief and pants. Pt rolled L and R with mod A and required +2 assist to position Maximove sling. Pt transferred bed<>power WC via Maximove dependently to preserve sacral wound. Pt required +2 assist to reposition hips in chair. Donned gait belt  around knees to prevent hip abduction. Reviewed pressure relief strategies including power tilt/recline and pt verbalized understanding and that pressure relief should be done every 20-30 minutes. Pt reported speaking with his wife and stated that he thinks he will be D/C to another facility. Therapist educated pt on expectations upon D/C. Concluded session with pt sitting in power WC, needs within reach, and seatbelt alarm and power WC seatbelt on.   Therapy Documentation Precautions:  Precautions Precautions: Fall Precaution Comments: quadriplegia Restrictions Weight Bearing Restrictions: No  Therapy/Group: Individual Therapy  M     PT, DPT   04/11/2020, 7:45 AM  

## 2020-04-11 NOTE — Progress Notes (Signed)
Straight cath x2 this shift per protocol. Scant amount of mucus shreds noted in urine.

## 2020-04-12 LAB — GLUCOSE, CAPILLARY
Glucose-Capillary: 152 mg/dL — ABNORMAL HIGH (ref 70–99)
Glucose-Capillary: 122 mg/dL — ABNORMAL HIGH (ref 70–99)
Glucose-Capillary: 112 mg/dL — ABNORMAL HIGH (ref 70–99)
Glucose-Capillary: 212 mg/dL — ABNORMAL HIGH (ref 70–99)

## 2020-04-12 NOTE — Plan of Care (Signed)
  Problem: SCI BOWEL ELIMINATION Goal: RH STG SCI MANAGE BOWEL PROGRAM W/ASSIST OR AS APPROPRIATE Description: STG SCI Manage bowel program w/assist or as appropriate. Max assist Outcome: Progressing   Problem: SCI BLADDER ELIMINATION Goal: RH STG SCI MANAGE BLADDER PROGRAM W/ASSISTANCE Description: Manage bladder with max assist Outcome: Progressing   Problem: RH SKIN INTEGRITY Goal: RH STG SKIN FREE OF INFECTION/BREAKDOWN Description: Prevent skin breakdown with max assist Outcome: Progressing Goal: RH STG ABLE TO PERFORM INCISION/WOUND CARE W/ASSISTANCE Description: STG Able To Perform Incision/Wound Care With Assistance. Manage peri area redness with mod assist Outcome: Progressing   Problem: RH PAIN MANAGEMENT Goal: RH STG PAIN MANAGED AT OR BELOW PT'S PAIN GOAL Description: Less than 4 Outcome: Progressing   Problem: Consults Goal: RH SPINAL CORD INJURY PATIENT EDUCATION Description:  See Patient Education module for education specifics.  Outcome: Progressing

## 2020-04-12 NOTE — Progress Notes (Signed)
[place] PHYSICAL MEDICINE & REHABILITATION PROGRESS NOTE  Subjective/Complaints: Patient seen laying in bed this morning.  He states he slept well overnight.  He notes he had a bowel movement.  He is concerned about decreasing strength, however discussed with patient and reassured.  ROS: Denies CP, SOB, N/V/D  Objective: Vital Signs: Blood pressure (!) 112/46, pulse 52, temperature 98.3 F (36.8 C), temperature source Oral, resp. rate 18, height 6' (1.829 m), weight 108.4 kg, SpO2 98 %. No results found. No results for input(s): WBC, HGB, HCT, PLT in the last 72 hours. No results for input(s): NA, K, CL, CO2, GLUCOSE, BUN, CREATININE, CALCIUM in the last 72 hours.  Physical Exam: BP (!) 112/46 (BP Location: Right Arm)   Pulse 52   Temp 98.3 F (36.8 C) (Oral)   Resp 18   Ht 6' (1.829 m)   Wt 108.4 kg   SpO2 98%   BMI 32.41 kg/m  Constitutional: No distress . Vital signs reviewed. HENT: Normocephalic.  Atraumatic. Eyes: EOMI. No discharge. Cardiovascular: No JVD.  RRR.  Respiratory: Normal effort.  No stridor.  Bilateral clear to auscultation. GI: Non-distended.  BS +. Skin: Sacral DTI not examined today.  Warm and dry. Psych: Normal mood.  Normal behavior. Musc: No edema in extremities.  No tenderness in extremities. Neuro: Alert Aware of deficits Motor:  RUE: Shoulder abduction, elbow flexion 5/5, wrist extension 4+/5, elbow ext 4+/5, distally 0/5, stable LUE: Shoulder abduction, elbow flexion 5/5, wrist extension 4+/5, elbow ext 4/5, hand grip 0/5, stable B/l LE: 0/5 proxima to distal, stable  Assessment/Plan: 1. Functional deficits secondary to NMO/transverse myelitis which require 3+ hours per day of interdisciplinary therapy in a comprehensive inpatient rehab setting.  Physiatrist is providing close team supervision and 24 hour management of active medical problems listed below.  Physiatrist and rehab team continue to assess barriers to discharge/monitor patient  progress toward functional and medical goals  Care Tool:  Bathing    Body parts bathed by patient: Right arm, Left arm, Chest, Abdomen, Face   Body parts bathed by helper: Buttocks     Bathing assist Assist Level: Contact Guard/Touching assist (UB)     Upper Body Dressing/Undressing Upper body dressing Upper body dressing/undressing activity did not occur (including orthotics): N/A What is the patient wearing?: Pull over shirt    Upper body assist Assist Level: Minimal Assistance - Patient > 75%    Lower Body Dressing/Undressing Lower body dressing      What is the patient wearing?: Pants, Incontinence brief     Lower body assist Assist for lower body dressing: Total Assistance - Patient < 25%     Toileting Toileting    Toileting assist Assist for toileting: Dependent - Patient 0%     Transfers Chair/bed transfer  Transfers assist     Chair/bed transfer assist level: Dependent - mechanical lift     Locomotion Ambulation   Ambulation assist   Ambulation activity did not occur: Safety/medical concerns (quadriplegia, decreased balance/postural control)          Walk 10 feet activity   Assist  Walk 10 feet activity did not occur: Safety/medical concerns (quadriplegia, decreased balance/postural control)        Walk 50 feet activity   Assist Walk 50 feet with 2 turns activity did not occur: Safety/medical concerns (quadriplegia, decreased balance/postural control)         Walk 150 feet activity   Assist Walk 150 feet activity did not occur: Safety/medical concerns (quadriplegia,  decreased balance/postural control)         Walk 10 feet on uneven surface  activity   Assist Walk 10 feet on uneven surfaces activity did not occur: Safety/medical concerns (quadriplegia, decreased balance/postural control)         Wheelchair     Assist Will patient use wheelchair at discharge?: Yes Type of Wheelchair: Power    Wheelchair  assist level: Supervision/Verbal cueing Max wheelchair distance: >58ft    Wheelchair 50 feet with 2 turns activity    Assist        Assist Level: Supervision/Verbal cueing   Wheelchair 150 feet activity     Assist     Assist Level: Supervision/Verbal cueing      Medical Problem List and Plan: 1.  Quadriparesis secondary to transverse myelitis/NMO.  IV IG completed 03/20/2020 as well as received Rituxan dose 03/24/2020, repeat dose on 7/20.   Continue CIR  B/L PRAFO nightly  Will consider bilateral tenodesis plans after discussion with therapies, await feedback from primary therapist  Await final discharge plans from patient 2.  Antithrombotics: -DVT/anticoagulation: Lovenox                Vascular study negative for DVT             -antiplatelet therapy: Aspirin 81 mg daily 3. Pain Management: Lidoderm patch, tramadol as needed  Controlled on 7/25  Monitor with increased exertion 4. Mood: Zoloft 150 mg nightly, Ativan as needed             -antipsychotic agents: N/A 5. Neuropsych: This patient is capable of making decisions on his own behalf. 6. Skin/Wound Care: Routine skin checks  Air mattress ordered  Continue to encourage pressure-relief  WOC following 7. Fluids/Electrolytes/Nutrition: Routine in and outs.   Labs ordered for tomorrow 8.  Diabetes mellitus type 2 with hyperglycemia.  Hemoglobin A1c 6.7.    On Jardiance 12.5 mg daily, Metformin 1000 mg twice daily, Lantus 30 units, NovoLog 25-30 units 3 times daily PTA  Lantus insulin 30 units daily  Metformin 500 twice daily resumed on 7/22  Jardiance 10 started on 7/16  Remains elevated on 7/25, will consider further increase tomorrow if persistent 9. HCAP/OSA.  Completed course of Maxipime initiated 03/18/2020 x14 doses.  Continue BiPAP 10. Hyperlipidemia. Lipitor/Vascepa 11. Hypertension.  Lisinopril 20 mg daily, decreased to 10 on 7/23  Controlled on 7/24  Monitor with increased mobility 12.   Transaminitis  LFTs elevated, but improving on 7/16  Continue to monitor 13.  Leukopenia- medication induced  WBCs 3.6 on 7/22, labs ordered for tomorrow  Discussed with pharmacy - [place] WNL  Continue to monitor 14.  Acute blood loss anemia  Hemoglobin 11.9 on 7/22, labs ordered for tomorrow  Continue to monitor 10.  Neurogenic bowel/bladder  Indwelling Foley due to traumatic I/O caths, DC Foley with I/O caths   Tolerating  Bowel program:    -miralax stopped   -added probiotic 7/17   -PM suppository DC'd on 7/19   -metamucil d/ced   Continue digital stimulation   Continues to have regular bowel movements  LOS: 18 days A FACE TO FACE EVALUATION WAS PERFORMED  Brent [name] 04/12/2020, 7:54 AM

## 2020-04-13 ENCOUNTER — Inpatient Hospital Stay (HOSPITAL_COMMUNITY): Payer: Medicare Other

## 2020-04-13 ENCOUNTER — Inpatient Hospital Stay (HOSPITAL_COMMUNITY): Payer: Medicare Other | Admitting: Occupational Therapy

## 2020-04-13 DIAGNOSIS — L89156 Pressure-induced deep tissue damage of sacral region: Secondary | ICD-10-CM

## 2020-04-13 LAB — GLUCOSE, CAPILLARY
Glucose-Capillary: 110 mg/dL — ABNORMAL HIGH (ref 70–99)
Glucose-Capillary: 158 mg/dL — ABNORMAL HIGH (ref 70–99)
Glucose-Capillary: 145 mg/dL — ABNORMAL HIGH (ref 70–99)
Glucose-Capillary: 138 mg/dL — ABNORMAL HIGH (ref 70–99)

## 2020-04-13 LAB — BASIC METABOLIC PANEL
Anion gap: 10 (ref 5–15)
BUN: 17 mg/dL (ref 8–23)
CO2: 24 mmol/L (ref 22–32)
Calcium: 9.2 mg/dL (ref 8.9–10.3)
Chloride: 103 mmol/L (ref 98–111)
Creatinine, Ser: 0.62 mg/dL (ref 0.61–1.24)
GFR calc Af Amer: >60 mL/min (ref >60)
GFR calc non Af Amer: >60 mL/min (ref >60)
Glucose, Bld: 130 mg/dL — ABNORMAL HIGH (ref 70–99)
Potassium: 3.9 mmol/L (ref 3.5–5.1)
Sodium: 137 mmol/L (ref 135–145)

## 2020-04-13 LAB — CBC WITH DIFFERENTIAL/PLATELET
Abs Immature Granulocytes: 0.05 10*3/uL (ref 0.00–0.07)
Basophils Absolute: 0 10*3/uL (ref 0.0–0.1)
Basophils Relative: 1 %
Eosinophils Absolute: 0 10*3/uL (ref 0.0–0.5)
Eosinophils Relative: 0 %
HCT: 39.9 % (ref 39.0–52.0)
Hemoglobin: 12.4 g/dL — ABNORMAL LOW (ref 13.0–17.0)
Immature Granulocytes: 1 %
Lymphocytes Relative: 22 %
Lymphs Abs: 1 10*3/uL (ref 0.7–4.0)
MCH: 27.7 pg (ref 26.0–34.0)
MCHC: 31.1 g/dL (ref 30.0–36.0)
MCV: 89.1 fL (ref 80.0–100.0)
Monocytes Absolute: 0.4 10*3/uL (ref 0.1–1.0)
Monocytes Relative: 8 %
Neutro Abs: 3.2 10*3/uL (ref 1.7–7.7)
Neutrophils Relative %: 68 %
Platelets: 260 10*3/uL (ref 150–400)
RBC: 4.48 MIL/uL (ref 4.22–5.81)
RDW: 13.2 % (ref 11.5–15.5)
WBC: 4.7 10*3/uL (ref 4.0–10.5)
nRBC: 0 % (ref 0.0–0.2)

## 2020-04-13 NOTE — Progress Notes (Signed)
Occupational Therapy Session Note  Patient Details  Name: Brent [name]. MRN: [id number] Date of Birth: 1946/08/03  Today's Date: 04/13/2020 OT Individual Time: 1345-1500 OT Individual Time Calculation (min): 75 min    Short Term Goals: Week 3:  OT Short Term Goal 1 (Week 3): Pt will don pants at bed level with max assist, utilizing circle sitting/figure 4 and AE as needed. OT Short Term Goal 2 (Week 3): Pt will maintain upright sitting position with CGA for 2 mins to demonstrate increased postural control as needed for UB dressing OT Short Term Goal 3 (Week 3): Pt will direct lift transfer with mod cues  Skilled Therapeutic Interventions/Progress Updates:    Treatment session with focus on activity tolerance and increased postural control in semi-standing position.  Pt received in power w/c expressing desire to utilize tilt table during session.  Educated on purpose of tilt table with increased tolerance to more upright, vertical positioning while allowing for WB through BLE.  Utilized maximove to transfer to tilt table with +2 assist for management of sling and lift.  Engaged in multiple attempts to increase angle with pt tolerating up to 40* for up to 5 mins by end of session.  Pt BP would drop but pt with only one instance of mild light-headedness.  Pt BP 102/46 in supine prior to session and dropping to 64/55 initially but then by end of session BP did not drop below 78/45 and maintained in that range for up to 5 mins.  Pt with increased activity tolerance this session.  Pt asking questions about d/c process if d/c disposition is altered.  Returned to bed via [employer].  Engaged in hygiene at bed level as pt with incontinent BM.  Pt able to complete rolling with mod assist and use of bed rails.  Total assist for hygiene and clothing management.  Remained semi-reclined in bed with all needs in reach.  Therapy Documentation Precautions:  Precautions Precautions: Fall Precaution Comments:  quadriplegia Restrictions Weight Bearing Restrictions: No General:   Vital Signs: Therapy Vitals Temp: 98.1 F (36.7 C) Temp Source: Oral Pulse Rate: 54 Resp: 18 BP: (!) 107/45 Patient Position (if appropriate): Sitting Oxygen Therapy SpO2: 100 % O2 Device: Room Air Pain:  Pt with no c/o pain   Therapy/Group: Individual Therapy  Simonne Come 04/13/2020, 4:04 PM

## 2020-04-13 NOTE — Progress Notes (Signed)
Physical Therapy Session Note  Patient Details  Name: Brent [name]. MRN: [id number] Date of Birth: 07/09/1946  Today's Date: 04/13/2020 PT Individual Time: 0800-0859 PT Individual Time Calculation (min): 59 min   Short Term Goals: Week 2:  PT Short Term Goal 1 (Week 2): Pt will perform rolling L/R with mod A x 1 PT Short Term Goal 1 - Progress (Week 2): Met PT Short Term Goal 2 (Week 2): Pt will initiate power wheelchair mobility PT Short Term Goal 2 - Progress (Week 2): Met PT Short Term Goal 3 (Week 2): Pt will maintain static sitting balance x 5 min with CGA PT Short Term Goal 3 - Progress (Week 2): Progressing toward goal Week 3:  PT Short Term Goal 1 (Week 3): Pt will maintain static sitting balance x 5 min with CGA PT Short Term Goal 2 (Week 3): Pt will begin to verbalize instruction for lift transfers PT Short Term Goal 3 (Week 3): Pt will demonstrate power tilt and recline features  Skilled Therapeutic Interventions/Progress Updates:   Received pt supine in bed with night RN present performing in and out cath, pt agreeable to therapy, and reported pain 5/10 in L upper trapezius. RN notified and administered pain medication during session. Session with emphasis on dressing, functional mobility/transfers, UE strengthening, dynamic sitting balance/coordination, trunk control, eating, and improved activity tolerance. Donned ted hose and shoes dependently while supine in bed and pt rolled to L and R with mod A and use of bedrails for therapist to fasten brief with total A. Pt rolled to L and R mod A x trial 2 and required total A to don shorts in supine. Pt transferred bed<>power WC via Maximove dependently with +2 assist due to sacral wounds. Pt required max A +2 to reposition hips in chair. Day RN present to adminster medications. Pt worked on dynamic sitting balance and trunk control while eating breakfast in power WC. Pt required max A to apply handgrip to hold utensils in R UE.  Therapist assisted with preparing coffee for pt and spreading jelly on muffin while pt used R UE and utenstil assist to eat eggs with supervision. Therapist assisted assembling sandwich with bacon and english muffin for pt for time management purposes and pt required assist from therapist to place sandwich in pt's hands however once sandwich in pt hands, pt able to eat with supervision. While pt ate, therapist provided deep pressure to trigger point along pt's L upper trapezius and pt reported relief. Concluded session with pt semi-reclined in power WC, needs within reach, and seatbelt alarm and power WC seatbelt on.   Therapy Documentation Precautions:  Precautions Precautions: Fall Precaution Comments: quadriplegia Restrictions Weight Bearing Restrictions: No  Therapy/Group: Individual Therapy Alfonse Alpers PT, DPT   04/13/2020, 7:17 AM

## 2020-04-13 NOTE — Plan of Care (Signed)
°  Problem: RH Balance Goal: LTG Patient will maintain dynamic sitting balance (PT) Description: LTG:  Patient will maintain dynamic sitting balance with assistance during mobility activities (PT) Flowsheets (Taken 04/13/2020 1222) LTG: Pt will maintain dynamic sitting balance during mobility activities with:: (downgraded due to quadriplegia and decreased trunk control) Contact Guard/Touching assist Note: downgraded due to quadriplegia and decreased trunk control

## 2020-04-13 NOTE — Progress Notes (Signed)
Patient had smear in brief, did dig stim and produced medium soft stool

## 2020-04-13 NOTE — Progress Notes (Signed)
[name] PHYSICAL MEDICINE & REHABILITATION PROGRESS NOTE  Subjective/Complaints: Patient seen laying in bed this morning.  He had an incontinent bowel movement this morning.  Discussed with nursing.  Patient had 2 bowel movements overnight as well.  After further discussion with nursing, patient has not received bowel program since last Thursday.  Discussed discharge date and venue with therapies as well.  ROS: Denies CP, SOB, N/V/D  Objective: Vital Signs: Blood pressure (!) 119/46, pulse 52, temperature 98.2 F (36.8 C), temperature source Oral, resp. rate 18, height 6' (1.829 m), weight 108.4 kg, SpO2 99 %. No results found. No results for input(s): WBC, HGB, HCT, PLT in the last 72 hours. No results for input(s): NA, K, CL, CO2, GLUCOSE, BUN, CREATININE, CALCIUM in the last 72 hours.  Physical Exam: BP (!) 119/46 (BP Location: Left Arm)   Pulse 52   Temp 98.2 F (36.8 C) (Oral)   Resp 18   Ht 6' (1.829 m)   Wt 108.4 kg   SpO2 99%   BMI 32.41 kg/m  Constitutional: No distress . Vital signs reviewed. HENT: Normocephalic.  Atraumatic. Eyes: EOMI. No discharge. Cardiovascular: No JVD. Respiratory: Normal effort.  No stridor. GI: Non-distended. Skin: Sacral ulcer with slough, margins appear to be improving. Warm and dry. Psych: Normal mood.  Normal behavior. Musc: No edema in extremities.  No tenderness in extremities. Neuro: Alert Aware of deficits Motor:  RUE: Shoulder abduction, elbow flexion 5/5, wrist extension 4+/5, elbow ext 4+/5, distally 0/5, unchanged LUE: Shoulder abduction, elbow flexion 5/5, wrist extension 4+/5, elbow ext 4/5, hand grip 0/5, unchanged B/l LE: 0/5 proxima to distal, stable  Assessment/Plan: 1. Functional deficits secondary to NMO/transverse myelitis which require 3+ hours per day of interdisciplinary therapy in a comprehensive inpatient rehab setting.  Physiatrist is providing close team supervision and 24 hour management of active medical  problems listed below.  Physiatrist and rehab team continue to assess barriers to discharge/monitor patient progress toward functional and medical goals  Care Tool:  Bathing    Body parts bathed by patient: Right arm, Left arm, Chest, Abdomen, Face   Body parts bathed by helper: Buttocks     Bathing assist Assist Level: Contact Guard/Touching assist (UB)     Upper Body Dressing/Undressing Upper body dressing Upper body dressing/undressing activity did not occur (including orthotics): N/A What is the patient wearing?: Pull over shirt    Upper body assist Assist Level: Minimal Assistance - Patient > 75%    Lower Body Dressing/Undressing Lower body dressing      What is the patient wearing?: Pants, Incontinence brief     Lower body assist Assist for lower body dressing: Total Assistance - Patient < 25%     Toileting Toileting    Toileting assist Assist for toileting: Dependent - Patient 0%     Transfers Chair/bed transfer  Transfers assist     Chair/bed transfer assist level: Dependent - mechanical lift     Locomotion Ambulation   Ambulation assist   Ambulation activity did not occur: Safety/medical concerns (quadriplegia, decreased balance/postural control)          Walk 10 feet activity   Assist  Walk 10 feet activity did not occur: Safety/medical concerns (quadriplegia, decreased balance/postural control)        Walk 50 feet activity   Assist Walk 50 feet with 2 turns activity did not occur: Safety/medical concerns (quadriplegia, decreased balance/postural control)         Walk 150 feet activity  Assist Walk 150 feet activity did not occur: Safety/medical concerns (quadriplegia, decreased balance/postural control)         Walk 10 feet on uneven surface  activity   Assist Walk 10 feet on uneven surfaces activity did not occur: Safety/medical concerns (quadriplegia, decreased balance/postural control)          Wheelchair     Assist Will patient use wheelchair at discharge?: Yes Type of Wheelchair: Power    Wheelchair assist level: Supervision/Verbal cueing Max wheelchair distance: >579ft    Wheelchair 50 feet with 2 turns activity    Assist        Assist Level: Supervision/Verbal cueing   Wheelchair 150 feet activity     Assist     Assist Level: Supervision/Verbal cueing      Medical Problem List and Plan: 1.  Quadriparesis secondary to transverse myelitis/NMO.  IV IG completed 03/20/2020 as well as received Rituxan dose 03/24/2020, repeat dose on 7/20.   Continue CIR  B/L PRAFO nightly  Will consider bilateral tenodesis plans after discussion with therapies, await feedback from primary therapist  Patient states that he is going to wait until Wednesday conference to determine discharge disposition. 2.  Antithrombotics: -DVT/anticoagulation: Lovenox                Vascular study negative for DVT             -antiplatelet therapy: Aspirin 81 mg daily 3. Pain Management: Lidoderm patch, tramadol as needed  Controlled on 7/26  Monitor with increased exertion 4. Mood: Zoloft 150 mg nightly, Ativan as needed             -antipsychotic agents: N/A 5. Neuropsych: This patient is capable of making decisions on his own behalf. 6. Skin/Wound Care: Routine skin checks  Air mattress ordered  Continue to encourage pressure-relief  WOC following 7. Fluids/Electrolytes/Nutrition: Routine in and outs.   BMP within acceptable range on 7/26 8.  Diabetes mellitus type 2 with hyperglycemia.  Hemoglobin A1c 6.7.    On Jardiance 12.5 mg daily, Metformin 1000 mg twice daily, Lantus 30 units, NovoLog 25-30 units 3 times daily PTA  Lantus insulin 30 units daily  Metformin 500 twice daily resumed on 7/22  Jardiance 10 started on 7/16  Labile on 7/26, monitor for trend 9. HCAP/OSA.  Completed course of Maxipime initiated 03/18/2020 x14 doses.  Continue BiPAP 10. Hyperlipidemia.  Lipitor/Vascepa 11. Hypertension.  Lisinopril 20 mg daily, decreased to 10 on 7/23  Controlled on 7/26  Monitor with increased mobility 12.  Transaminitis  LFTs elevated, but improving on 7/16  Continue to monitor 13.  Leukopenia- medication induced  WBCs 4.7 on 7/26  Discussed with pharmacy - [place] WNL  Continue to monitor 14.  Acute blood loss anemia  Hemoglobin 12.4 on 7/26  Continue to monitor 10.  Neurogenic bowel/bladder  Indwelling Foley due to traumatic I/O caths, DC Foley with I/O caths   Tolerating  Bowel program:    -miralax stopped   -added probiotic 7/17   -PM suppository DC'd on 7/19   -metamucil d/ced   Continue digital stimulation   Discussed bowel program again with nursing.-dig stim after dinner  LOS: 19 days A FACE TO FACE EVALUATION WAS PERFORMED  Brent [name] Lorie Phenix 04/13/2020, 9:00 AM

## 2020-04-13 NOTE — Progress Notes (Signed)
Occupational Therapy Session Note  Patient Details  Name: Brent [name]. MRN: [id number] Date of Birth: 1945/11/11  Today's Date: 04/13/2020 OT Individual Time: 1018-1100 OT Individual Time Calculation (min): 42 min    Short Term Goals: Week 3:  OT Short Term Goal 1 (Week 3): Pt will don pants at bed level with max assist, utilizing circle sitting/figure 4 and AE as needed. OT Short Term Goal 2 (Week 3): Pt will maintain upright sitting position with CGA for 2 mins to demonstrate increased postural control as needed for UB dressing OT Short Term Goal 3 (Week 3): Pt will direct lift transfer with mod cues  Skilled Therapeutic Interventions/Progress Updates:    Treatment session with focus on functional use of BUE with focus on wrist flexion/extension and pinch grip.  Pt received upright in power w/c reporting already dressing during PT session.  Engaged in discussion regarding possible tenodesis splint with focus on gross vs pinch function with plan to further discuss with MD.  Pt maneuvered power w/c to therapy gym without cues and positioned at table top with only setup for positioning of control panel to increase positioning at table.  Pt able to pick up yellow foam blocks with good 3 jaw chuck, however when increased challenge to picking up 1" cubes (increased weight) pt with increased difficulty.  Applied dycem to surface with pt demonstrating improved ability to pick up cubes however requiring increased time, effort, and sliding of object towards body.  Discussed impact of weight and size of items to success with functional grasp.  Pt with good success with RUE, able to pick up foam pieces with Lt hand but not cubes.  Discussed wrist flexion/extension and ulnar/radial deviation exercises to allow for use of tenodesis grasp.  Provided pt with pink foam block, pt able to grasp and pick up block but unable to squeeze block.  Encouraged pt to attempt squeeze during down time.  Challenged pt to  attempt thumb opposition, pt unable to move thumb on Rt or Lt to abduct or oppose to pinky.  Encouraged continued attempts at each movement.  Returned to room via power w/c.  Pt remained tilted back with w/c belt and seat belt alarm intact, and all needs in reach.   Therapy Documentation Precautions:  Precautions Precautions: Fall Precaution Comments: quadriplegia Restrictions Weight Bearing Restrictions: No Pain: Pain Assessment Pain Scale: 0-10 Pain Score: 5  Pain Type: Acute pain Pain Location: Back Pain Orientation: Upper Pain Descriptors / Indicators: Aching Pain Frequency: Constant Pain Onset: Gradual Pain Intervention(s): Medication (See eMAR)   Therapy/Group: Individual Therapy  Simonne Come 04/13/2020, 11:35 AM

## 2020-04-14 ENCOUNTER — Inpatient Hospital Stay (HOSPITAL_COMMUNITY): Payer: Medicare Other

## 2020-04-14 ENCOUNTER — Encounter (HOSPITAL_COMMUNITY): Payer: Medicare Other | Admitting: Occupational Therapy

## 2020-04-14 ENCOUNTER — Ambulatory Visit (HOSPITAL_COMMUNITY): Payer: Medicare Other

## 2020-04-14 LAB — GLUCOSE, CAPILLARY
Glucose-Capillary: 111 mg/dL — ABNORMAL HIGH (ref 70–99)
Glucose-Capillary: 122 mg/dL — ABNORMAL HIGH (ref 70–99)
Glucose-Capillary: 171 mg/dL — ABNORMAL HIGH (ref 70–99)
Glucose-Capillary: 164 mg/dL — ABNORMAL HIGH (ref 70–99)

## 2020-04-14 MED ORDER — COLLAGENASE 250 UNIT/GM EX OINT
TOPICAL_OINTMENT | Freq: Every day | CUTANEOUS | Status: DC
  Administered 2020-04-20: 1 via TOPICAL
  Filled 2020-04-14: qty 30

## 2020-04-14 NOTE — Progress Notes (Signed)
Bowel program performed and medium stool produced

## 2020-04-14 NOTE — Progress Notes (Signed)
Orthopedic Tech Progress Note Patient Details:  Brent [name] Jan 07, 1946 [id number] Called in order to [facility] for BILATERAL TENODESIS SPLINTS. Patient ID: Brent Frye., male   DOB: 1946-08-18, 74 y.o.   MRN: [id number]   [name] 04/14/2020, 8:34 AM

## 2020-04-14 NOTE — Consult Note (Signed)
[place] Nurse wound follow up Patient receiving care in 4M8.  Patient is on a low air loss mattress. Wound type: unstageable sacral/coccyx PI Measurement: 7 cm c 6.8 cm Wound bed: 95% thick grey/dry slough; remainder is yellow Drainage (amount, consistency, odor)  Periwound: erythematous with slight induration immediately around the wound border Dressing procedure/placement/frequency:  I have requested PT hydrotherapy.  Apply Santyl to sacral/coccyx wounds in a nickel thick layer. Cover with a saline moistened gauze, then foam dressing. Change daily. PT to perform after hydrotherapy. Primary nurse to do when PT not doing hydrotherapy.  Discussed plan of care with the patient and bedside nurse.   Val Riles, RN, MSN, CWOCN, CNS-BC, pager [phone]

## 2020-04-14 NOTE — Progress Notes (Signed)
Physical Therapy Session Note  Patient Details  Name: Brent [name]. MRN: [id number] Date of Birth: 06-22-1946  Today's Date: 04/14/2020 PT Individual Time: 6644-0347 and 1430-1530  PT Individual Time Calculation (min): 46 min and 60 min  Short Term Goals: Week 2:  PT Short Term Goal 1 (Week 2): Pt will perform rolling L/R with mod A x 1 PT Short Term Goal 1 - Progress (Week 2): Met PT Short Term Goal 2 (Week 2): Pt will initiate power wheelchair mobility PT Short Term Goal 2 - Progress (Week 2): Met PT Short Term Goal 3 (Week 2): Pt will maintain static sitting balance x 5 min with CGA PT Short Term Goal 3 - Progress (Week 2): Progressing toward goal Week 3:  PT Short Term Goal 1 (Week 3): Pt will maintain static sitting balance x 5 min with CGA PT Short Term Goal 2 (Week 3): Pt will begin to verbalize instruction for lift transfers PT Short Term Goal 3 (Week 3): Pt will demonstrate power tilt and recline features  Skilled Therapeutic Interventions/Progress Updates:   Treatment Session 1: 1113-1159 46 min Received pt sitting in power WC at sink with OT; PT took over with care. Pt agreeable to therapy, and did not state pain level during session. Session with emphasis on dressing, power WC mobility, UE strengthening, dynamic sitting balance, trunk control, and improved activity tolerance. Worked on dynamic sitting balance, trunk control, and anterior weight shifting washing face with washmit with max A to don mit due to UE fatigue. Pt dried face with washcloth and min A. Pt donned pull over shirt sitting in power WC with min/mod A. Pt able to don mask with supervision and increased time with emphasis on fine motor control. Pt performed power WC mobility 129f x 2 and >1519fx 1 with supervision throughout session. Worked on UE strengthening, ROM, and trunk control on UBE at level 0 for 3 minues with bilateral ace wrapped donned to maintain grip on handles. Pt able to maintain sitting  balance without back support with close supervision/CGA on UBE. Pt with increased fatigue afterwards and required extensive rest break. In dayroom, pt performed bilateral shoulder extensions into physioball on chair 2x8 reps with emphasis on trunk control, sitting balance, and anterior leans with cues for anterior lean and to maintain balance without back support with CGA/mod A to correct lateral lean. Worked on UE ROM and trunk control holding physioball and leaning forward to place physioball in chair, drop it, and lean back against chair 1x3 and 1x5 reps with min/mod A for trunk control. Pt with poor concentric and ecentric control requiring greater assist when leaning forward due to tendency for pt to lean too far anteriorly. Concluded session with pt semi-reclined in power WC, needs within reach, and seatbelt alarm and power WC seatbelt on.   Treatment Session 2: 1430-1530 60 min Received pt sitting in power WC, pt agreeable to therapy, and did not state pain level during session. Session with emphasis on functional mobility/transfers, power WC mobility, UE strengthening, dynamic sitting balance, trunk control, NMR, and improved activity tolerance. Pt performed power WC mobility 15061f 2 trials to/from therapy gym with supervision. Pt transferred power WC<>mat and mat<>power WC x 2 trials throughout session dependently via Maximove with +2 assist. Doffed and donned Maximove sling with total A +2 x 3 reps throughout session. Worked on static sitting balance on mat for approximately 30 minutes with emphasis on dynamic sitting balance, trunk control, and core strengthening. Pt  able to maintain sitting balance with bilateral UE support on mat with mod A of 1 (+2 in front of pt for safety) fading to close supervision/CGA. Pt reported lightheadedness and dizziness. BP: 90/50 sitting on pt but reported decreased symptoms after approximately 6 minutes. Transitioned to placing UE's in lap rather than extended  laterally on mat. However pt with increased difficulty resulting in placing UE's on therapist's forearms x4 repetitions for approximately 1 minute intervals requiring mod A +2 for balance. Pt required cues for anterior weight shifting, as pt with tendency for posterior lean. Pt then transitioned to bilateral elbows onto knees with emphasis on anterior weight shifting then pushing UE's up along LE's to get back to neutral position x 4 reps with mod A +2. Pt required multiple extended rest breaks throughout session resting back on therapist. Worked on trunk control with focus on targeting obliques starting in neutral, transitioning laterally onto L and R elbow, and pushing back up into neutral using triceps x 3 reps with wedge underneath for support with mod A +2 for positioning and trunk control and cues to look towards the floor to facilitate anterior weight shifting. Pt reported increased fatigue and neck discomfort with activity. Pt performed deep pressure to L upper trapezius trigger point and pt reported relief. Concluded session with pt sitting in power WC, needs within reach, and seatbelt alarm and power WC seatbelt on.   Therapy Documentation Precautions:  Precautions Precautions: Fall Precaution Comments: quadriplegia Restrictions Weight Bearing Restrictions: No  Therapy/Group: Individual Therapy Alfonse Alpers PT, DPT   04/14/2020, 7:28 AM

## 2020-04-14 NOTE — Progress Notes (Signed)
Patient ID: Brent Klipfel., male   DOB: 1946/05/09, 74 y.o.   MRN: [id number]  This SW covering for primary SW, Erlene Quan.   SW followed-up with pt wife to discuss discharge plan. Confirms that d/c plan is now for pt to d/c to SNF. She states she has not had enough time to review the referral list that was sent by SW. She indicated that she would review. SW discussed follow-up by Thursday to discuss preferred SNF location.    Brent [name], MSW, [place] Office: [phone] Cell: [phone] Fax: [phone]

## 2020-04-14 NOTE — Progress Notes (Signed)
[place] PHYSICAL MEDICINE & REHABILITATION PROGRESS NOTE  Subjective/Complaints: Patient seen laying in bed this morning.  He states he slept fairly well overnight.  Discussed plans for bowel program with nursing again today.  ROS: Denies CP, SOB, N/V/D  Objective: Vital Signs: Blood pressure (!) 101/47, pulse 51, temperature 98.5 F (36.9 C), temperature source Oral, resp. rate 15, height 6' (1.829 m), weight 108.4 kg, SpO2 96 %. No results found. Recent Labs    04/13/20 0905  WBC 4.7  HGB 12.4*  HCT 39.9  PLT 260   Recent Labs    04/13/20 0905  NA 137  K 3.9  CL 103  CO2 24  GLUCOSE 130*  BUN 17  CREATININE 0.62  CALCIUM 9.2    Physical Exam: BP (!) 101/47 (BP Location: Left Arm)   Pulse 51   Temp 98.5 F (36.9 C) (Oral)   Resp 15   Ht 6' (1.829 m)   Wt 108.4 kg   SpO2 96%   BMI 32.41 kg/m  Constitutional: No distress . Vital signs reviewed. HENT: Normocephalic.  Atraumatic. Eyes: EOMI. No discharge. Cardiovascular: No JVD.  RRR. Respiratory: Normal effort.  No stridor.  Bilateral clear to auscultation. GI: Non-distended.  BS +. Skin: Sacral ulcer not examined today. Warm and dry. Psych: Normal mood.  Normal behavior. Musc: No edema in extremities.  No tenderness in extremities. Neuro: Alert Aware of deficits Motor:  RUE: Shoulder abduction, elbow flexion 5/5, wrist extension 4+/5, elbow ext 4+/5, distally 0/5, stable LUE: Shoulder abduction, elbow flexion 5/5, wrist extension 4+/5, elbow ext 4/5, hand grip 0/5, stable B/l LE: 0/5 proxima to distal, stable  Assessment/Plan: 1. Functional deficits secondary to NMO/transverse myelitis which require 3+ hours per day of interdisciplinary therapy in a comprehensive inpatient rehab setting.  Physiatrist is providing close team supervision and 24 hour management of active medical problems listed below.  Physiatrist and rehab team continue to assess barriers to discharge/monitor patient progress toward  functional and medical goals  Care Tool:  Bathing    Body parts bathed by patient: Right arm, Left arm, Chest, Abdomen, Face   Body parts bathed by helper: Buttocks     Bathing assist Assist Level: Contact Guard/Touching assist (UB)     Upper Body Dressing/Undressing Upper body dressing Upper body dressing/undressing activity did not occur (including orthotics): N/A What is the patient wearing?: Pull over shirt    Upper body assist Assist Level: Minimal Assistance - Patient > 75%    Lower Body Dressing/Undressing Lower body dressing      What is the patient wearing?: Pants, Incontinence brief     Lower body assist Assist for lower body dressing: Total Assistance - Patient < 25%     Toileting Toileting    Toileting assist Assist for toileting: Dependent - Patient 0%     Transfers Chair/bed transfer  Transfers assist     Chair/bed transfer assist level: Dependent - mechanical lift     Locomotion Ambulation   Ambulation assist   Ambulation activity did not occur: Safety/medical concerns (quadriplegia, decreased balance/postural control)          Walk 10 feet activity   Assist  Walk 10 feet activity did not occur: Safety/medical concerns (quadriplegia, decreased balance/postural control)        Walk 50 feet activity   Assist Walk 50 feet with 2 turns activity did not occur: Safety/medical concerns (quadriplegia, decreased balance/postural control)         Walk 150 feet activity  Assist Walk 150 feet activity did not occur: Safety/medical concerns (quadriplegia, decreased balance/postural control)         Walk 10 feet on uneven surface  activity   Assist Walk 10 feet on uneven surfaces activity did not occur: Safety/medical concerns (quadriplegia, decreased balance/postural control)         Wheelchair     Assist Will patient use wheelchair at discharge?: Yes Type of Wheelchair: Power    Wheelchair assist level:  Supervision/Verbal cueing Max wheelchair distance: >572ft    Wheelchair 50 feet with 2 turns activity    Assist        Assist Level: Supervision/Verbal cueing   Wheelchair 150 feet activity     Assist     Assist Level: Supervision/Verbal cueing      Medical Problem List and Plan: 1.  Quadriparesis secondary to transverse myelitis/NMO.  IV IG completed 03/20/2020 as well as received Rituxan dose 03/24/2020, repeat dose on 7/20.   Continue CIR  B/L PRAFO nightly  Discussed with multiple therapies tenodesis splint, purpose, functional goals, etc.  Will order.  Patient states that he is going to wait until Wednesday conference to determine discharge disposition. 2.  Antithrombotics: -DVT/anticoagulation: Lovenox                Vascular study negative for DVT             -antiplatelet therapy: Aspirin 81 mg daily 3. Pain Management: Lidoderm patch, tramadol as needed  Controlled on 7/27  Monitor with increased exertion 4. Mood: Zoloft 150 mg nightly, Ativan as needed             -antipsychotic agents: N/A 5. Neuropsych: This patient is capable of making decisions on his own behalf. 6. Skin/Wound Care: Routine skin checks  Air mattress ordered  Continue to encourage pressure-relief  WOC following 7. Fluids/Electrolytes/Nutrition: Routine in and outs.   BMP within acceptable range on 7/26 8.  Diabetes mellitus type 2 with hyperglycemia.  Hemoglobin A1c 6.7.    On Jardiance 12.5 mg daily, Metformin 1000 mg twice daily, Lantus 30 units, NovoLog 25-30 units 3 times daily PTA  Lantus insulin 30 units daily  Metformin 500 twice daily resumed on 7/22  Jardiance 10 started on 7/16  Slightly labile on 7/27 9. HCAP/OSA.  Completed course of Maxipime initiated 03/18/2020 x14 doses.  Continue BiPAP 10. Hyperlipidemia. Lipitor/Vascepa 11. Hypertension.  Lisinopril 20 mg daily, decreased to 10 on 7/23  Controlled on 7/27  Monitor with increased mobility 12.  Transaminitis  LFTs  elevated, but improving on 7/16  Continue to monitor 13.  Leukopenia- medication induced  WBCs 4.7 on 7/26  Discussed with pharmacy - [place] WNL  Continue to monitor 14.  Acute blood loss anemia  Hemoglobin 12.4 on 7/26  Continue to monitor 10.  Neurogenic bowel/bladder  Indwelling Foley due to traumatic I/O caths, DC Foley with I/O caths   Tolerating  Bowel program:    -miralax stopped   -added probiotic 7/17   -PM suppository DC'd on 7/19   -metamucil d/ced   Continue digital stimulation   Discussed bowel program again with nursing, again.-dig stim after dinner  LOS: 20 days A FACE TO FACE EVALUATION WAS PERFORMED  Sanyiah Kanzler Lorie Phenix 04/14/2020, 8:21 AM

## 2020-04-14 NOTE — Progress Notes (Addendum)
Occupational Therapy Session Note  Patient Details  Name: Brent [name]. MRN: [id number] Date of Birth: 1946/09/11  Today's Date: 04/14/2020 OT Individual Time: 7341-9379 OT Individual Time Calculation (min): 66 min    Short Term Goals: Week 3:  OT Short Term Goal 1 (Week 3): Pt will don pants at bed level with max assist, utilizing circle sitting/figure 4 and AE as needed. OT Short Term Goal 2 (Week 3): Pt will maintain upright sitting position with CGA for 2 mins to demonstrate increased postural control as needed for UB dressing OT Short Term Goal 3 (Week 3): Pt will direct lift transfer with mod cues  Skilled Therapeutic Interventions/Progress Updates:    Treatment session with focus on self-care retraining, trunk control, and use of modified positioning to increase success with LB bathing/dressing.  Pt received supine in bed agreeable to therapy session.  Pt asking questions about his therapy schedule for today - discussed potential that his wife would have been present for education to observe/discuss his current status and goals.  Pt's wife did not attend therapy session this date.  Engaged in LB bathing and dressing and toileting at bed level with focus on rolling, use of bed features to increase success with rolling and sitting balance. Pt able to wash lower legs, including feet this session when seated fully upright in hospital bed.  Therapist assisted pt with obtaining figure 4 position, then pt able to loop opposite arm underneath leg to lift to wash underside of leg.  Utilized wash mit with setup to apply then pt able to wash all body parts, except buttocks, with min-mod assist to obtain positioning.  Pt able to thread RLE in to shorts after setup with increased time due to decreased grasp.  Required assistance to thread LLE and pull pants over hips.  Pt attempted to hook shorts on to hand in sidelying to pull pants over hips - may benefit from loops attached in to pants to increase  success.  Pt demonstrating ability to roll Rt and Lt during LB tasks with mod assist and use of bed rails.  Positioned maximove sling for transfer OOB with +2 for safety during transfer and to ensure proper alignment in chair.  Engaged in [place] bathing at sink with setup to apply wash mit and to pull shirt over trunk and head prior to doffing shirt.  Pt left upright at sink with PT to complete UB bathing/dressing.  Discussed bilateral tenodesis splints with MD to increase functional grasp as needed for self-care tasks.  Upon arrival to room, pt wearing wrist cock up splints on B wrists - these impacted his ability to use his wrist extension to utilize his stylus and other UE movements.  Will follow up with MD regarding wrist cockup splints.  Therapy Documentation Precautions:  Precautions Precautions: Fall Precaution Comments: quadriplegia Restrictions Weight Bearing Restrictions: No Pain: Pain Assessment Pain Scale: 0-10 Pain Score: 0-No pain   Therapy/Group: Individual Therapy  Simonne Come 04/14/2020, 11:34 AM

## 2020-04-14 NOTE — Progress Notes (Signed)
Per Dr. Posey Pronto this pt is on a bowel program. There is no order for bowel program noted in chart. Pt had small bowel movement in brief, dig Stim and bowel program completed. Completed until vault was clear. Pt produced medium soft stool. Dressing in place clean dry and intact. No other concerns to report. Karla Pavone, Lpn

## 2020-04-15 ENCOUNTER — Ambulatory Visit (HOSPITAL_COMMUNITY): Payer: Medicare Other

## 2020-04-15 ENCOUNTER — Inpatient Hospital Stay (HOSPITAL_COMMUNITY): Payer: Medicare Other

## 2020-04-15 LAB — GLUCOSE, CAPILLARY
Glucose-Capillary: 131 mg/dL — ABNORMAL HIGH (ref 70–99)
Glucose-Capillary: 110 mg/dL — ABNORMAL HIGH (ref 70–99)
Glucose-Capillary: 178 mg/dL — ABNORMAL HIGH (ref 70–99)
Glucose-Capillary: 209 mg/dL — ABNORMAL HIGH (ref 70–99)

## 2020-04-15 NOTE — Progress Notes (Addendum)
[place] PHYSICAL MEDICINE & REHABILITATION PROGRESS NOTE  Subjective/Complaints: Patient seen laying in bed this AM.  He states he slept well overnight.  He states he had a BM last night.  He states he was fitted with tenodesis splints yesterday, but was taken off due to questions.  He states he is willing to go to SNF if that is the recommendation, but has not seen the potential locations.   ROS: Denies CP, SOB, N/V/D  Objective: Vital Signs: Blood pressure (!) 104/48, pulse 56, temperature 99.2 F (37.3 C), temperature source Oral, resp. rate 18, height 6' (1.829 m), weight 108.4 kg, SpO2 97 %. No results found. Recent Labs    04/13/20 0905  WBC 4.7  HGB 12.4*  HCT 39.9  PLT 260   Recent Labs    04/13/20 0905  NA 137  K 3.9  CL 103  CO2 24  GLUCOSE 130*  BUN 17  CREATININE 0.62  CALCIUM 9.2    Physical Exam: BP (!) 104/48 (BP Location: Left Arm)   Pulse 56   Temp 99.2 F (37.3 C) (Oral)   Resp 18   Ht 6' (1.829 m)   Wt 108.4 kg   SpO2 97%   BMI 32.41 kg/m  Constitutional: No distress . Vital signs reviewed. HENT: Normocephalic.  Atraumatic. Eyes: EOMI. No discharge. Cardiovascular: No JVD. RRR. Respiratory: Normal effort.  No stridor. Bilaterally clear to auscultation. GI: Non-distended. BS+ Skin: Sacral ulcer not examined today. Warm and dry.  Psych: Normal mood.  Normal behavior. Musc: No edema in extremities.  No tenderness in extremities. Neuro: Alert. Aware of deficits Motor:  RUE: Shoulder abduction, elbow flexion 5/5, wrist extension 4+/5, elbow ext 4+/5, distally 0/5, stable LUE: Shoulder abduction, elbow flexion 5/5, wrist extension 4+/5, elbow ext 4+/5, hand grip 0/5 B/l LE: 0/5 proxima to distal, stable  Assessment/Plan: 1. Functional deficits secondary to NMO/transverse myelitis which require 3+ hours per day of interdisciplinary therapy in a comprehensive inpatient rehab setting.  Physiatrist is providing close team supervision and 24  hour management of active medical problems listed below.  Physiatrist and rehab team continue to assess barriers to discharge/monitor patient progress toward functional and medical goals  Care Tool:  Bathing    Body parts bathed by patient: Right arm, Left arm, Chest, Abdomen, Front perineal area, Right upper leg, Left upper leg, Right lower leg, Left lower leg, Face   Body parts bathed by helper: Buttocks     Bathing assist Assist Level: Moderate Assistance - Patient 50 - 74%     Upper Body Dressing/Undressing Upper body dressing Upper body dressing/undressing activity did not occur (including orthotics): N/A What is the patient wearing?: Pull over shirt    Upper body assist Assist Level: Minimal Assistance - Patient > 75%    Lower Body Dressing/Undressing Lower body dressing      What is the patient wearing?: Pants, Incontinence brief     Lower body assist Assist for lower body dressing: Maximal Assistance - Patient 25 - 49%     Toileting Toileting    Toileting assist Assist for toileting: Dependent - Patient 0%     Transfers Chair/bed transfer  Transfers assist     Chair/bed transfer assist level: Dependent - mechanical lift     Locomotion Ambulation   Ambulation assist   Ambulation activity did not occur: Safety/medical concerns (quadriplegia, decreased balance/postural control)          Walk 10 feet activity   Assist  Walk 10 feet  activity did not occur: Safety/medical concerns (quadriplegia, decreased balance/postural control)        Walk 50 feet activity   Assist Walk 50 feet with 2 turns activity did not occur: Safety/medical concerns (quadriplegia, decreased balance/postural control)         Walk 150 feet activity   Assist Walk 150 feet activity did not occur: Safety/medical concerns (quadriplegia, decreased balance/postural control)         Walk 10 feet on uneven surface  activity   Assist Walk 10 feet on uneven  surfaces activity did not occur: Safety/medical concerns (quadriplegia, decreased balance/postural control)         Wheelchair     Assist Will patient use wheelchair at discharge?: Yes Type of Wheelchair: Manual    Wheelchair assist level: Supervision/Verbal cueing Max wheelchair distance: >547ft    Wheelchair 50 feet with 2 turns activity    Assist        Assist Level: Supervision/Verbal cueing   Wheelchair 150 feet activity     Assist     Assist Level: Supervision/Verbal cueing      Medical Problem List and Plan: 1.  Quadriparesis secondary to transverse myelitis/NMO.  IV IG completed 03/20/2020 as well as received Rituxan dose 03/24/2020, repeat dose on 7/20.   Continue CIR, qdaily now  B/L PRAFO nightly  ?Tenodesis splint delivered, will discuss with therapies again  Team conference today to discuss current and goals and coordination of care, home and environmental barriers, and discharge planning with nursing, case manager, and therapies. Please see conference note from today as well.  2.  Antithrombotics: -DVT/anticoagulation: Lovenox                Vascular study negative for DVT             -antiplatelet therapy: Aspirin 81 mg daily 3. Pain Management: Lidoderm patch, tramadol as needed  Controlled on 7/28  Monitor with increased exertion 4. Mood: Zoloft 150 mg nightly, Ativan as needed             -antipsychotic agents: N/A 5. Neuropsych: This patient is capable of making decisions on his own behalf. 6. Skin/Wound Care: Routine skin checks  Air mattress ordered  Continue to encourage pressure-relief  WOC following 7. Fluids/Electrolytes/Nutrition: Routine in and outs.   BMP within acceptable range on 7/26 8.  Diabetes mellitus type 2 with hyperglycemia.  Hemoglobin A1c 6.7.    On Jardiance 12.5 mg daily, Metformin 1000 mg twice daily, Lantus 30 units, NovoLog 25-30 units 3 times daily PTA  Lantus insulin 30 units daily  Metformin 500 twice  daily resumed on 7/22  Jardiance 10 started on 7/16  Slightly labile on 7/28 9. HCAP/OSA.  Completed course of Maxipime initiated 03/18/2020 x14 doses.  Continue BiPAP 10. Hyperlipidemia. Lipitor/Vascepa 11. Hypertension.  Lisinopril 20 mg daily, decreased to 10 on 7/23  Soft on 7/28, will consider further decrease if necessary  Monitor with increased mobility 12.  Transaminitis  LFTs elevated, but improving on 7/16  Continue to monitor 13.  Leukopenia- medication induced  WBCs 4.7 on 7/26  Discussed with pharmacy - [place] WNL  Continue to monitor 14.  Acute blood loss anemia  Hemoglobin 12.4 on 7/26  Continue to monitor 10.  Neurogenic bowel/bladder  Indwelling Foley due to traumatic I/O caths, DC Foley with I/O caths   Continue to require  Bowel program:    -miralax stopped   -added probiotic 7/17   -PM suppository DC'd on 7/19   -  metamucil d/ced   Continue digital stimulation   Having nightly BMs  LOS: 21 days A FACE TO FACE EVALUATION WAS PERFORMED  Freddi Forster Lorie Phenix 04/15/2020, 7:49 AM

## 2020-04-15 NOTE — Progress Notes (Signed)
Physical Therapy Wound Treatment Patient Details  Name: Brent F Mcnairy Jr. MRN: [id number] Date of Birth: 01/14/1946  Today's Date: 04/15/2020 PT Individual Time: 1152-1214 PT Individual Time Calculation (min): 22 min   Subjective  Subjective: "What does the spray do?" Patient and Family Stated Goals: none stated Date of Onset:  (unknown) Prior Treatments: dressing change  Pain Score: 0/10  Wound Assessment  Pressure Injury 03/25/20 Buttocks Right;Medial Unstageable - Full thickness tissue loss in which the base of the injury is covered by slough (yellow, tan, gray, green or brown) and/or eschar (tan, brown or black) in the wound bed. (Active)  Wound Image   04/15/20 1221  Dressing Type ABD;Barrier Film (skin prep);Gauze (Comment);Moist to moist 04/15/20 1221  Dressing Changed;Clean;Dry;Intact 04/15/20 1221  Dressing Change Frequency Daily 04/15/20 1221  State of Healing Non-healing 04/15/20 1221  Site / Wound Assessment Yellow;Brown 04/15/20 1221  % Wound base Red or Granulating 0% 04/15/20 1221  % Wound base Yellow/Fibrinous Exudate 100% 04/15/20 1221  % Wound base Black/Eschar 0% 04/15/20 1221  % Wound base Other/Granulation Tissue (Comment) 0% 04/15/20 1221  Peri-wound Assessment Erythema (blanchable) 04/15/20 1221  Wound Length (cm) 6 cm 04/15/20 1221  Wound Width (cm) 6 cm 04/15/20 1221  Wound Depth (cm) 0.1 cm 04/15/20 1221  Wound Surface Area (cm^2) 36 cm^2 04/15/20 1221  Wound Volume (cm^3) 3.6 cm^3 04/15/20 1221  Drainage Amount Minimal 04/15/20 1221  Drainage Description Serosanguineous 04/15/20 1221  Treatment Debridement (Selective);Hydrotherapy (Pulse lavage);Packing (Saline gauze) 04/15/20 1221   Santyl applied to wound bed prior to applying dressing.     Hydrotherapy Pulsed lavage therapy - wound location: R buttocks Pulsed Lavage with Suction (psi): 12 psi Pulsed Lavage with Suction - Normal Saline Used: 1000 mL Pulsed Lavage Tip: Tip with splash  shield Selective Debridement Selective Debridement - Location: R buttocks Selective Debridement - Tools Used: Forceps;Scalpel Selective Debridement - Tissue Removed: cross hatched yellow eschar   Wound Assessment and Plan  Wound Therapy - Assess/Plan/Recommendations Wound Therapy - Clinical Statement: Pt presents to hydrotherapy with unstageable pressure injury to right buttocks. Will benefit from further treatment to remove necrotic tissue and promote healing. Wound Therapy - Functional Problem List: decreased mobility, paraplegic Factors Delaying/Impairing Wound Healing: Altered sensation;Immobility Hydrotherapy Plan: Debridement;Patient/family education;Pulsatile lavage with suction Wound Therapy - Frequency: 6X / week Wound Therapy - Follow Up Recommendations: Skilled nursing facility Wound Plan: see above  Wound Therapy Goals- Improve the function of patient's integumentary system by progressing the wound(s) through the phases of wound healing (inflammation - proliferation - remodeling) by: Decrease Necrotic Tissue to: 80 Decrease Necrotic Tissue - Progress: Goal set today Increase Granulation Tissue to: 20 Increase Granulation Tissue - Progress: Goal set today Goals/treatment plan/discharge plan were made with and agreed upon by patient/family: Yes Time For Goal Achievement: 7 days Wound Therapy - Potential for Goals: Fair  Goals will be updated until maximal potential achieved or discharge criteria met.  Discharge criteria: when goals achieved, discharge from hospital, MD decision/surgical intervention, no progress towards goals, refusal/missing three consecutive treatments without notification or medical reason.  GP     Brown, PT, DPT Acute Rehabilitation Services Pager [phone] Office [phone]      Carloine T Brown 04/15/2020, 12:32 PM    

## 2020-04-15 NOTE — Progress Notes (Addendum)
Physical Therapy Session Note  Patient Details  Name: Brent [name]. MRN: [id number] Date of Birth: 23-Feb-1946  Today's Date: 04/15/2020 PT Individual Time: 0915-1028 PT Individual Time Calculation (min): 73 min   Short Term Goals: Week 2:  PT Short Term Goal 1 (Week 2): Pt will perform rolling L/R with mod A x 1 PT Short Term Goal 1 - Progress (Week 2): Met PT Short Term Goal 2 (Week 2): Pt will initiate power wheelchair mobility PT Short Term Goal 2 - Progress (Week 2): Met PT Short Term Goal 3 (Week 2): Pt will maintain static sitting balance x 5 min with CGA PT Short Term Goal 3 - Progress (Week 2): Progressing toward goal Week 3:  PT Short Term Goal 1 (Week 3): Pt will maintain static sitting balance x 5 min with CGA PT Short Term Goal 2 (Week 3): Pt will begin to verbalize instruction for lift transfers PT Short Term Goal 3 (Week 3): Pt will demonstrate power tilt and recline features  Skilled Therapeutic Interventions/Progress Updates:   Received pt supine in bed with NTs present assist with dressing; PT took over with care, pt agreeable to therapy, and continues to report discomfort in L upper trapezius. Session with emphasis on functional mobility/transfers, dressing, UE strengthening, dynamic sitting balance/coordination, trunk control, power WC mobility, and improved activity tolerance. Recreational therapy present during session. Donned shoes dependently. Pt rolled to L and R with mod A and use of bed rails and required +2 assist to position Maximove sling. Pt transferred bed<>power WC via Maximove dependently with +2 assist due to sacral wounds and required +2 assist to reposition in power chair. Doffed dirty shirt and donned clean pull over shirt with mod A with emphasis on anterior leans and trunk control. Pt performed power WC mobility 182f x2 trials with supervision to/from therapy gym. Extensive time spent adjusting speed controls on power WC joystick. Discussed SNF  placement and educated pt on expectations including visitation policies, therapy expectations, and educated pt on importance of advocating for himself and remaining independent in directing his care as much as possible in terms of what his is able to do mobility wise. Pt with questions regarding timeline for SNF placement and asking for list of various facilities to choose from (therapist informed CSW at team conference). Pt emotional during session and near tears during multiple instances. Discussed coping strategies with pt to use with wife and grandson as pt reporting that his entire family feels "overwhelmed". Pt reported not wanting to "burden" his wife with the amount of care he requires and therapist reassured pt that he would have nursing and therapy care at SNF but was unsure of visitation policies at each facility. Concluded session with pt semi-reclined in power WC, needs within reach, and seatbelt alarm and power WC seatbelt on.   Therapy Documentation Precautions:  Precautions Precautions: Fall Precaution Comments: quadriplegia Restrictions Weight Bearing Restrictions: No  Therapy/Group: Individual Therapy AAlfonse AlpersPT, DPT   04/15/2020, 7:43 AM

## 2020-04-15 NOTE — Patient Care Conference (Cosign Needed)
Inpatient RehabilitationTeam Conference and Plan of Care Update Date: 04/15/2020   Time: 11:32    Patient Name: Brent [name].      Medical Record Number: [id number]  Date of Birth: [birth year] Sex: Male         Room/Bed: 4M08C/4M08C-01 Payor Info: Payor: MEDICARE / Plan: MEDICARE PART A AND B / Product Type: *No Product type* /    Admit Date/Time:  03/25/2020  2:57 PM  Primary Diagnosis:  Quadriplegia and quadriparesis [facility])  Hospital Problems: Principal Problem:   Quadriplegia and quadriparesis ([place]) Active Problems:   Acute blood loss anemia   Leukopenia   Transaminitis   Controlled type 2 diabetes mellitus with hyperglycemia, with long-term current use of insulin (HCC)   Neurogenic bladder   Neurogenic bowel   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 Delice Lesch Care Coodinator Present: Dorien Chihuahua, RN, BSN, CRRN;Loralee Pacas, LCSWA Nurse Present: Suella Grove, RN PT Present: Becky Sax, PT OT Present: Lillia Corporal, OT PPS Coordinator present : Gunnar Fusi, Novella Olive, PT     Current Status/Progress Goal Weekly Team Focus  Bowel/Bladder   In and out cath q4 hours for bladder scans over 300.Marland Kitchen incontinent of bowel. LBM 04/14/20, pt is on a bowel program   Become continet of bowel and begin urinating on own   continue in and out cath q 4, continue bowel program, assess q shift and prn   Swallow/Nutrition/ Hydration             ADL's   min-mod assist sitting balance, mod assist bathing with LB bed level and UB at sink, min assist UB dressing, max assist LB dressing.  Using maximove and educating on pressure relief due to wound on buttocks.  ModA dynamic sitting, toileting, LB dsg; MinA grooming, bathing; set up eating/UB dress, func use of  BUE  ADL retraining, static and dynamic sitting balance, trunk control, functional use of BUE, directing care   Mobility   mod A rolling, max A +2 supine<>sit and sit<>supine, bed<>power WC +2 assist (via Maximove due to sacral wounds), power WC mobility 541ft supervision  max A transfers, CGA sitting balance, Mod I power WC mobility  functional mobility/transfers, dynamic sitting balance/coordination, trunk control, endurance, discharge planning   Communication             Safety/Cognition/ Behavioral Observations            Pain   c/o back pain radiating to shoulder 7/10  decrease back pain to level 2/10  assess pain q shift and prn   Skin   MASD to groin, Stage 3 to coccyx   prevent furthur break down and heal current wounds   Santyl and saline soaked gauze to bottom covered with foam daily. Hydrotherapy with therapy daily.      Team Discussion:  Discharge Planning/Teaching Needs:  D/c to SNF. Pt wife prefers SNF in [facility]. Waiting on preferred SNF location. Wife has been asked to provide an update by Thursday (7/29).  Family education as recommended.   Current Update:    Current Barriers to Discharge:  Decreased caregiver support  Neurogenic bowel and bladder Wound Care  Possible Resolutions to Barriers: SNF recommended Bowel program effective/continued, I+O catheterizations q 4-6 hours WOC following wound care regimen  Patient on target to meet rehab goals: no, minimal improvement noted within the past week due to poor endurance. Remains dependent for transfers and total assist of 2 for slideboard transfers  *See Care Plan and progress notes for long and short-term goals.   Revisions to Treatment Plan:  Change to q day therapy schedule     Medical Summary Current Status: Quadriparesis secondary to transverse myelitis/NMO Weekly Focus/Goal: Improve mobility, neurogenic bowel, CBGs, BP  Barriers to Discharge: Medical stability;Decreased family/caregiver  support   Possible Resolutions to Barriers: Therapies, follow labs, optimize DM/BP meds, bowel/bladder program, ?orthosis   Continued Need for Acute Rehabilitation Level of Care: The patient requires daily medical management by a physician with specialized training in physical medicine and rehabilitation for the following reasons: Direction of a multidisciplinary physical rehabilitation program to maximize functional independence : Yes Medical management of patient stability for increased activity during participation in an intensive rehabilitation regime.: Yes Analysis of laboratory values and/or radiology reports with any subsequent need for medication adjustment and/or medical intervention. : Yes   I attest that I was present, lead the team conference, and concur with the assessment and plan of the team.   Dorien Chihuahua B 04/15/2020, 4:13 PM

## 2020-04-15 NOTE — Progress Notes (Signed)
Patient ID: Brent Boze., male   DOB: 1946-09-08, 74 y.o.   MRN: [id number]  This SW covering for primary SW, Erlene Quan.   SW met with pt in room to provide SNF placement list per pt request. SW informed pt on extended bed offers. Pt to review and will follow-up with [place], MSW, [place] Office: [phone] Cell: [phone] Fax: [phone]

## 2020-04-15 NOTE — Progress Notes (Signed)
Physical Therapy Session Note  Patient Details  Name: Brent [name]. MRN: [id number] Date of Birth: May 04, 1946  Today's Date: 04/15/2020 PT Individual Time: 0830-0900 PT Individual Time Calculation (min): 30 min   Short Term Goals: Week 3:  PT Short Term Goal 1 (Week 3): Pt will maintain static sitting balance x 5 min with CGA PT Short Term Goal 2 (Week 3): Pt will begin to verbalize instruction for lift transfers PT Short Term Goal 3 (Week 3): Pt will demonstrate power tilt and recline features  Skilled Therapeutic Interventions/Progress Updates:    Discussion with Meghan from [place] in regards to braces requested by MD/OT yesterday. Assessed wrist flexion/extension and finger ROM/strength with her present. Need to follow up with MD in regards to request to confirm as different brace was delivered yesterday. Educated patient on various options of bracing and purpose.  Focused on bed mobility and dressing at bed level to prepare for OOB today. Total to don [place].Pt able to recall technique for dressing in bed yesterday. Assisted pt into figure 4 position with use of hospital bed features to promote better positioning with assist to thread shorts. Pt able to thread R leg but requires assist with L. Worked on pulling up pants to thighs and then RN administered morning medication and pt required bladder scan and cath, so missed rest of therapy session.   Therapy Documentation Precautions:  Precautions Precautions: Fall Precaution Comments: quadriplegia Restrictions Weight Bearing Restrictions: No  Pain:   Denies pain.   Therapy/Group: Individual Therapy  [name], PT, DPT, CBIS  04/15/2020, 9:10 AM

## 2020-04-15 NOTE — Progress Notes (Signed)
Recreational Therapy Session Note  Patient Details  Name: Henrik Orihuela. MRN: [id number] Date of Birth: Sep 13, 1946 Today's Date: 04/15/2020  Pain: no c/o Skilled Therapeutic Interventions/Progress Updates: Session focused on discharge planning and coping during co-treat with PT.  Pt performing w/c mobility on the unit via power w/c with supervision.  Once in therapy gym, pt discussing discharge questions and concerns that he and his wife had.  Discussed SNF level of care, therapies, potential visitor restrictions, & an emphasis on self advocacy.  Pt with multiple questions about SNF, when would he transfer, what types of therapy would he receive, what would he be working on, what were his options for facilities..he would like a list to review.  Pt stated that both he and his wife feel overwhelmed and now confused on the discharge process.  PT informed pt that she could bring up his concerns at team conference today.  Therapy/Group: Co-Treatment   Kaden Dunkel 04/15/2020, 3:21 PM

## 2020-04-16 ENCOUNTER — Inpatient Hospital Stay (HOSPITAL_COMMUNITY): Payer: Medicare Other

## 2020-04-16 ENCOUNTER — Ambulatory Visit (HOSPITAL_COMMUNITY): Payer: Medicare Other | Admitting: *Deleted

## 2020-04-16 LAB — GLUCOSE, CAPILLARY
Glucose-Capillary: 110 mg/dL — ABNORMAL HIGH (ref 70–99)
Glucose-Capillary: 157 mg/dL — ABNORMAL HIGH (ref 70–99)
Glucose-Capillary: 175 mg/dL — ABNORMAL HIGH (ref 70–99)
Glucose-Capillary: 157 mg/dL — ABNORMAL HIGH (ref 70–99)

## 2020-04-16 NOTE — Progress Notes (Signed)
Physical Therapy Weekly Progress Note  Patient Details  Name: Brent [name]. MRN: [id number] Date of Birth: [birth year]  Beginning of progress report period: March 26, 2020 End of progress report period: April 16, 2020  Today's Date: 04/16/2020 PT Individual Time: 4193-7902 PT Individual Time Calculation (min): 44 min   Patient has met 2 of 3 short term goals. Pt continues to demonstrate slow progress towards long term goals. Pt currently able to roll to L and R with min/mod A and use of bedrails. Pt able to transfer supine<>sit and sit<>supine with max A for trunk control and LE management. Pt is currently able to maintain static sitting balance with bilateral UE support and feet supported with CGA (with 2nd person for safety) for 2-3 minute intervals before needing to rest back on therapist due to fatigue. Pt has continued to transfer bed<>chair dependently using Maximove with +2 assist due to sacral wounds but was previously able to transfer with slideboard and max A +2. Pt demonstrated improvements with managing power WC tilt/recline features and verbalizes understanding of importance of pressure relief every 20-30 minutes. Pt able to perform power WC mobility >586f with supervision. Pt continues to be limited by poor endurance, decreased sitting balance and trunk control, and additional impairments secondary to quadriplegia.   Patient continues to demonstrate the following deficits muscle weakness and muscle paralysis, abnormal tone, unbalanced muscle activation, decreased coordination and decreased motor planning and decreased sitting balance, decreased standing balance, decreased postural control and decreased balance strategies and therefore will continue to benefit from skilled PT intervention to increase functional independence with mobility.  Patient progressing toward long term goals..  Continue plan of care.  PT Short Term Goals Week 3:  PT Short Term Goal 1 (Week 3): Pt will  maintain static sitting balance x 5 min with CGA PT Short Term Goal 1 - Progress (Week 3): Progressing toward goal PT Short Term Goal 2 (Week 3): Pt will begin to verbalize instruction for lift transfers PT Short Term Goal 2 - Progress (Week 3): Met PT Short Term Goal 3 (Week 3): Pt will demonstrate power tilt and recline features PT Short Term Goal 3 - Progress (Week 3): Met Week 4:  PT Short Term Goal 1 (Week 4): Pt will maintain static sitting balance x 5 min with CGA PT Short Term Goal 2 (Week 4): Pt will consistantly be able to instruct process/technique for lift transfers PT Short Term Goal 3 (Week 4): Pt will perform anterior leans in power WC with CGA  Skilled Therapeutic Interventions/Progress Updates:  Discharge planning;Functional mobility training;Psychosocial support;Therapeutic Activities;Balance/vestibular training;Disease management/prevention;Neuromuscular re-education;Skin care/wound management;Therapeutic Exercise;Wheelchair propulsion/positioning;DME/adaptive equipment instruction;Pain management;UE/LE Strength taining/ROM;Community reintegration;Functional electrical stimulation;Patient/family education;UE/LE Coordination activities   Today's Interventions: Received pt supine in bed, pt agreeable to therapy, and did not state pain level. Session with emphasis on functional mobility/transfers, power WC mobility, dressing, dynamic sitting balance/coordination, UE strengthening, trunk control, and improved activity tolerance. Pt rolled to L and R x 3 trials during session with min A and use of bedrails and required total A to don brief, pants, to perform peri-care due to small BM smear, and to position Maximove sling. Donned bilateral ted hose and tennis shoes dependently. NT present to check blood glucose levels. Donned Maximove sling with +2 assist and pt transferred bed<>power WC via Maximove dependently with +2 assist. Therapist tilted pt in power WC and repositioned hips with  total A of 1 and donned gait belt around knees to prevent excessive hip abduction.  Pt performed power WC mobility 157f x 2 trials to/from therapy gym with supervision. Pt performed the following exercises seated in power WC without back support (to emphasize trunk control) and min A for sitting balance: -bicep curls with 1lb dowel x15 reps -horizontal chest press at 90 degrees with 1lb dowel x15 reps  Concluded session with pt semi-reclined in power WC, needs within reach, and seatbelt alarm and power WC seatbelt on. Therapist provided fresh drink for pt.   Therapy Documentation Precautions:  Precautions Precautions: Fall Precaution Comments: quadriplegia Restrictions Weight Bearing Restrictions: No  Therapy/Group: Individual Therapy AAlfonse AlpersPT, DPT   04/16/2020, 7:46 AM

## 2020-04-16 NOTE — Progress Notes (Addendum)
[facility] PHYSICAL MEDICINE & REHABILITATION PROGRESS NOTE  Subjective/Complaints: Patient seen sitting up in bed this morning, work with therapies.  He states he slept well overnight.  States had a bowel movement last night.  He states he received blood up in bed offers and is going to evaluate.  ROS: Denies CP, SOB, N/V/D  Objective: Vital Signs: Blood pressure (!) 105/44, pulse 59, temperature 98.7 F (37.1 C), resp. rate 16, height 6' (1.829 m), weight 108.4 kg, SpO2 96 %. No results found. Recent Labs    04/13/20 0905  WBC 4.7  HGB 12.4*  HCT 39.9  PLT 260   Recent Labs    04/13/20 0905  NA 137  K 3.9  CL 103  CO2 24  GLUCOSE 130*  BUN 17  CREATININE 0.62  CALCIUM 9.2    Physical Exam: BP (!) 105/44 (BP Location: Left Arm)   Pulse 59   Temp 98.7 F (37.1 C)   Resp 16   Ht 6' (1.829 m)   Wt 108.4 kg   SpO2 96%   BMI 32.41 kg/m  Constitutional: No distress . Vital signs reviewed. HENT: Normocephalic.  Atraumatic. Eyes: EOMI. No discharge. Cardiovascular: No JVD.  RRR.  Respiratory: Normal effort.  No stridor.  Bilateral clear to auscultation. GI: Non-distended.  BS +. Skin: Sacral ulcer not examined today.  Warm and dry. Psych: Normal mood.  Normal behavior. Musc: No edema in extremities.  No tenderness in extremities. Neuro: Alert Aware of deficits Motor:  RUE: Shoulder abduction, elbow flexion 5/5, wrist extension 4+/5, elbow ext 4+/5, distally 0/5, unchanged LUE: Shoulder abduction, elbow flexion 5/5, wrist extension 4-4+/5, elbow ext 4+/5, hand grip 0/5 B/l LE: 0/5 proxima to distal, stable  Assessment/Plan: 1. Functional deficits secondary to NMO/transverse myelitis which require 3+ hours per day of interdisciplinary therapy in a comprehensive inpatient rehab setting.  Physiatrist is providing close team supervision and 24 hour management of active medical problems listed below.  Physiatrist and rehab team continue to assess barriers to  discharge/monitor patient progress toward functional and medical goals  Care Tool:  Bathing    Body parts bathed by patient: Right arm, Left arm, Chest, Abdomen, Front perineal area, Right upper leg, Left upper leg, Right lower leg, Left lower leg, Face   Body parts bathed by helper: Buttocks     Bathing assist Assist Level: Moderate Assistance - Patient 50 - 74%     Upper Body Dressing/Undressing Upper body dressing Upper body dressing/undressing activity did not occur (including orthotics): N/A What is the patient wearing?: Pull over shirt    Upper body assist Assist Level: Minimal Assistance - Patient > 75%    Lower Body Dressing/Undressing Lower body dressing      What is the patient wearing?: Pants, Incontinence brief     Lower body assist Assist for lower body dressing: Maximal Assistance - Patient 25 - 49%     Toileting Toileting    Toileting assist Assist for toileting: Dependent - Patient 0%     Transfers Chair/bed transfer  Transfers assist     Chair/bed transfer assist level: Dependent - mechanical lift     Locomotion Ambulation   Ambulation assist   Ambulation activity did not occur: Safety/medical concerns (quadriplegia, decreased balance/postural control)          Walk 10 feet activity   Assist  Walk 10 feet activity did not occur: Safety/medical concerns (quadriplegia, decreased balance/postural control)        Walk 50 feet activity  Assist Walk 50 feet with 2 turns activity did not occur: Safety/medical concerns (quadriplegia, decreased balance/postural control)         Walk 150 feet activity   Assist Walk 150 feet activity did not occur: Safety/medical concerns (quadriplegia, decreased balance/postural control)         Walk 10 feet on uneven surface  activity   Assist Walk 10 feet on uneven surfaces activity did not occur: Safety/medical concerns (quadriplegia, decreased balance/postural control)          Wheelchair     Assist Will patient use wheelchair at discharge?: Yes Type of Wheelchair: Manual    Wheelchair assist level: Supervision/Verbal cueing Max wheelchair distance: >526ft    Wheelchair 50 feet with 2 turns activity    Assist        Assist Level: Supervision/Verbal cueing   Wheelchair 150 feet activity     Assist     Assist Level: Supervision/Verbal cueing      Medical Problem List and Plan: 1.  Quadriparesis secondary to transverse myelitis/NMO.  IV IG completed 03/20/2020 as well as received Rituxan dose 03/24/2020, repeat dose on 7/20.   Continue CIR, qdaily now  B/L PRAFO nightly  Discussed with therapies again, will hold off on tenodesis splint at present. 2.  Antithrombotics: -DVT/anticoagulation: Lovenox                Vascular study negative for DVT             -antiplatelet therapy: Aspirin 81 mg daily 3. Pain Management: Lidoderm patch, tramadol as needed  Controlled on 7/29  Monitor with increased exertion 4. Mood: Zoloft 150 mg nightly, Ativan as needed             -antipsychotic agents: N/A 5. Neuropsych: This patient is capable of making decisions on his own behalf. 6. Skin/Wound Care: Routine skin checks  Air mattress ordered  Continue to encourage pressure-relief  Ongoing hydrotherapy.  WOC following 7. Fluids/Electrolytes/Nutrition: Routine in and outs.   BMP within acceptable range on 7/26 8.  Diabetes mellitus type 2 with hyperglycemia.  Hemoglobin A1c 6.7.    On Jardiance 12.5 mg daily, Metformin 1000 mg twice daily, Lantus 30 units, NovoLog 25-30 units 3 times daily PTA  Lantus insulin 30 units daily  Metformin 500 twice daily resumed on 7/22  Jardiance 10 started on 7/16  Labile on 7/29 9. HCAP/OSA.  Completed course of Maxipime initiated 03/18/2020 x14 doses.  Continue BiPAP 10. Hyperlipidemia. Lipitor/Vascepa 11. Hypertension.  Lisinopril 20 mg daily, decreased to 10 on 7/23  Relatively controlled on 7/29  Monitor  with increased mobility 12.  Transaminitis  LFTs elevated, but improving on 7/16  Continue to monitor 13.  Leukopenia- medication induced  WBCs 4.7 on 7/26  Discussed with pharmacy - [facility] WNL  Continue to monitor 14.  Acute blood loss anemia  Hemoglobin 12.4 on 7/26  Continue to monitor 10.  Neurogenic bowel/bladder  Indwelling Foley due to traumatic I/O caths, DC Foley with I/O caths   Tolerating  Bowel program:    -miralax stopped   -added probiotic 7/17   -PM suppository DC'd on 7/19   -metamucil d/ced   Continue digital stimulation   Regular BMs.  LOS: 22 days A FACE TO FACE EVALUATION WAS PERFORMED  Brent [name] 04/16/2020, 8:37 AM

## 2020-04-16 NOTE — Progress Notes (Signed)
Occupational Therapy Session Note  Patient Details  Name: Brent [name]. MRN: [id number] Date of Birth: 18-Jan-1946  Today's Date: 04/16/2020 OT Individual Time: 1771-1657 OT Individual Time Calculation (min): 26 min    Short Term Goals: Week 3:  OT Short Term Goal 1 (Week 3): Pt will don pants at bed level with max assist, utilizing circle sitting/figure 4 and AE as needed. OT Short Term Goal 2 (Week 3): Pt will maintain upright sitting position with CGA for 2 mins to demonstrate increased postural control as needed for UB dressing OT Short Term Goal 3 (Week 3): Pt will direct lift transfer with mod cues  Skilled Therapeutic Interventions/Progress Updates:    Treatment session with focus on ADL retraining and Streeter to improve functional grasp. No pain reported. Pt received semi-reclined in bed with NT present eating breakfast, reporting having slept poorly. Modified set up via positioning of ADL items to increase independence in eating breakfast, providing minA to eat. Pt demonstrated carry over of education on compensatory strategies. Donned pants with maxA with pt advancing pants over legs with raised HOB in circle sitting, and pt assisting with lifting legs. Provided modA for rolling while donning pants. Repositioned pt with modA +2, with pt assisting pulling with BUE to scoot up in bed. Positioned pt to decrease external rotation of hips after doffing PRAFO boots. Ended session with pt semi-reclined in bed with all needs within reach.    Therapy Documentation Precautions:  Precautions Precautions: Fall Precaution Comments: quadriplegia Restrictions Weight Bearing Restrictions: No General:   Vital Signs:  Pain: No pain reported.  Therapy/Group: Individual Therapy  Michelle Nasuti 04/16/2020, 12:27 PM

## 2020-04-16 NOTE — Progress Notes (Signed)
Bladder scan performed at 0430 with resultant 260 mL.  No straight cath indicated due to less than 300 mL. Abdomen soft and non-distended at this time. Patient resting comfortably in NAD.  Will continue to monitor with bladder scans Q4H per order.  Carrigan Delafuente Montey Hora, MSN, RN, CNL IP Rehab

## 2020-04-16 NOTE — Progress Notes (Signed)
Physical Therapy Wound Treatment Patient Details  Name: Brent [name]. MRN: [id number] Date of Birth: September 23, 1945  Today's Date: 04/16/2020 PT Individual Time: 0913-0945 PT Individual Time Calculation (min): 32 min   Subjective  Subjective: Agreeable to treatment  Patient and Family Stated Goals: none stated Date of Onset:  (unknown) Prior Treatments: dressing change  Pain Score:  0/10  Wound Assessment  Pressure Injury 03/25/20 Buttocks Right;Medial Unstageable - Full thickness tissue loss in which the base of the injury is covered by slough (yellow, tan, gray, green or brown) and/or eschar (tan, brown or black) in the wound bed. (Active)  Dressing Type ABD;Barrier Film (skin prep);Gauze (Comment);Moist to moist 04/16/20 1006  Dressing Changed;Dry;Clean;Intact 04/16/20 1006  Dressing Change Frequency Daily 04/16/20 1006  State of Healing Non-healing 04/16/20 1006  Site / Wound Assessment Yellow;Brown 04/16/20 1006  % Wound base Red or Granulating 0% 04/16/20 1006  % Wound base Yellow/Fibrinous Exudate 100% 04/16/20 1006  % Wound base Black/Eschar 0% 04/16/20 1006  % Wound base Other/Granulation Tissue (Comment) 0% 04/16/20 1006  Peri-wound Assessment Erythema (blanchable) 04/16/20 1006  Wound Length (cm) 6 cm 04/15/20 1221  Wound Width (cm) 6 cm 04/15/20 1221  Wound Depth (cm) 0.1 cm 04/15/20 1221  Wound Surface Area (cm^2) 36 cm^2 04/15/20 1221  Wound Volume (cm^3) 3.6 cm^3 04/15/20 1221  Drainage Amount Minimal 04/16/20 1006  Drainage Description Serosanguineous;Green 04/16/20 1006  Treatment Debridement (Selective);Hydrotherapy (Pulse lavage);Packing (Saline gauze) 04/16/20 1006   Santyl applied to wound bed prior to applying dressing.     Hydrotherapy Pulsed lavage therapy - wound location: R buttocks Pulsed Lavage with Suction (psi): 12 psi Pulsed Lavage with Suction - Normal Saline Used: 1000 mL Pulsed Lavage Tip: Tip with splash shield Selective  Debridement Selective Debridement - Location: R buttocks Selective Debridement - Tools Used: Forceps;Scalpel Selective Debridement - Tissue Removed: yellow necrotic tissue   Wound Assessment and Plan  Wound Therapy - Assess/Plan/Recommendations Wound Therapy - Clinical Statement: Noted green drainage today. Eschar beginning to loosen and able to initiate removal with sharp debridement. Will benefit from further treatment to remove necrotic tissue and promote healing. Wound Therapy - Functional Problem List: decreased mobility, paraplegic Factors Delaying/Impairing Wound Healing: Altered sensation;Immobility Hydrotherapy Plan: Debridement;Patient/family education;Pulsatile lavage with suction Wound Therapy - Frequency: 6X / week Wound Therapy - Follow Up Recommendations: Skilled nursing facility Wound Plan: see above  Wound Therapy Goals- Improve the function of patient's integumentary system by progressing the wound(s) through the phases of wound healing (inflammation - proliferation - remodeling) by: Decrease Necrotic Tissue to: 80 Decrease Necrotic Tissue - Progress: Progressing toward goal Increase Granulation Tissue to: 20 Increase Granulation Tissue - Progress: Progressing toward goal Goals/treatment plan/discharge plan were made with and agreed upon by patient/family: Yes Time For Goal Achievement: 7 days Wound Therapy - Potential for Goals: Fair  Goals will be updated until maximal potential achieved or discharge criteria met.  Discharge criteria: when goals achieved, discharge from hospital, MD decision/surgical intervention, no progress towards goals, refusal/missing three consecutive treatments without notification or medical reason.  GP      Wyona Almas, PT, DPT Acute Rehabilitation Services Pager [phone] Office [phone]   [name] 04/16/2020, 10:11 AM

## 2020-04-17 ENCOUNTER — Inpatient Hospital Stay (HOSPITAL_COMMUNITY): Payer: Medicare Other | Admitting: Occupational Therapy

## 2020-04-17 ENCOUNTER — Inpatient Hospital Stay (HOSPITAL_COMMUNITY): Payer: Medicare Other

## 2020-04-17 LAB — GLUCOSE, CAPILLARY
Glucose-Capillary: 121 mg/dL — ABNORMAL HIGH (ref 70–99)
Glucose-Capillary: 154 mg/dL — ABNORMAL HIGH (ref 70–99)
Glucose-Capillary: 213 mg/dL — ABNORMAL HIGH (ref 70–99)

## 2020-04-17 MED ORDER — DAKINS (1/4 STRENGTH) 0.125 % EX SOLN
Freq: Every morning | CUTANEOUS | Status: AC
  Administered 2020-04-21: 1 via TOPICAL
  Filled 2020-04-17 (×2): qty 473

## 2020-04-17 NOTE — Progress Notes (Signed)
Physical Therapy Session Note  Patient Details  Name: Brent [name]. MRN: [id number] Date of Birth: 10-26-1945  Today's Date: 04/17/2020 PT Individual Time: 1115-1215 PT Individual Time Calculation (min): 60 min   Short Term Goals: Week 3:  PT Short Term Goal 1 (Week 3): Pt will maintain static sitting balance x 5 min with CGA PT Short Term Goal 1 - Progress (Week 3): Progressing toward goal PT Short Term Goal 2 (Week 3): Pt will begin to verbalize instruction for lift transfers PT Short Term Goal 2 - Progress (Week 3): Met PT Short Term Goal 3 (Week 3): Pt will demonstrate power tilt and recline features PT Short Term Goal 3 - Progress (Week 3): Met Week 4:  PT Short Term Goal 1 (Week 4): Pt will maintain static sitting balance x 5 min with CGA PT Short Term Goal 2 (Week 4): Pt will consistantly be able to instruct process/technique for lift transfers PT Short Term Goal 3 (Week 4): Pt will perform anterior leans in power WC with CGA  Skilled Therapeutic Interventions/Progress Updates:   Received pt supine in bed, pt agreeable to therapy, and did not state pain level. Session with emphasis on functional mobility/transfers, power WC mobility, UE strengthening, dynamic sitting balance/coordination, trunk control, and improved activity tolerance. Donned shoes supine in bed dependently and pt rolled to L and R with min A use of bedrails to don Maximove sling with +2 assist. Pt transferred bed<>power WC via Maximove dependently with +2 assist and required +2 assist to reposition hips in chair. Donned gait belt around bilateral knees to prevent excessive hip abduction. Pt performed power WC mobility 19f x 2 trials to/from therapy gym with supervision. Worked on dynamic sitting balance, trunk control, and UE strengthening and ROM tossing horseshoes x 2 trials with min/mod A of 1 for sitting balance with +2 assist from recreational therapist to encourage pt to reach outside BOS in various  directions for horseshoes. Pt required frequent rest breaks due to fatigue. Provided emotional support and encouragement and educated pt on importance of advocating for himself while he is here and when he transfers to his next facility. Concluded session with pt semi-reclined in power WC, needs within reach, and seatbelt alarm and power WC seatbelt on.   Therapy Documentation Precautions:  Precautions Precautions: Fall Precaution Comments: quadriplegia Restrictions Weight Bearing Restrictions: No  Therapy/Group: Individual Therapy AAlfonse AlpersPT, DPT   04/17/2020, 7:32 AM

## 2020-04-17 NOTE — Plan of Care (Signed)
  Problem: RH Toileting Goal: LTG Patient will perform toileting task (3/3 steps) with assistance level (OT) Description: LTG: Patient will perform toileting task (3/3 steps) with assistance level (OT)  Flowsheets (Taken 04/17/2020 0805) LTG: Pt will perform toileting task (3/3 steps) with assistance level: (downgraded) Maximal Assistance - Patient 25 - 49% Note: Downgraded due to decreased functional grasp as needed for clothing management and hygiene   Problem: RH Functional Use of Upper Extremity Goal: LTG Patient will use RT/LT upper extremity as a (OT) Description: LTG: Patient will use right/left upper extremity as a stabilizer/gross assist/diminished/nondominant/dominant level with assist, with/without cues during functional activity (OT) Flowsheets (Taken 04/17/2020 0805) LTG: Pt will use upper extremity in functional activity with assistance level of: (downgraded) Supervision/Verbal cueing Note: Downgraded due to decreased functional grasp

## 2020-04-17 NOTE — Progress Notes (Signed)
[facility] PHYSICAL MEDICINE & REHABILITATION PROGRESS NOTE  Subjective/Complaints:  Pt reports is doing about the same- denies pain- had BM with bowel program last night per pt- don't see it documented in chart. Getting in and out cathed- said doesn't know why he was changed from foley since would be unable to cath self, per pt.   Said doesn't have enough "hand function".      ROS:  Pt denies SOB, abd pain, CP, N/V/C/D, and vision changes   Objective: Vital Signs: Blood pressure (!) 101/49, pulse 56, temperature 98 F (36.7 C), resp. rate 18, height 6' (1.829 m), weight 108.4 kg, SpO2 98 %. No results found. No results for input(s): WBC, HGB, HCT, PLT in the last 72 hours. No results for input(s): NA, K, CL, CO2, GLUCOSE, BUN, CREATININE, CALCIUM in the last 72 hours.  Physical Exam: BP (!) 101/49   Pulse 56   Temp 98 F (36.7 C)   Resp 18   Ht 6' (1.829 m)   Wt 108.4 kg   SpO2 98%   BMI 32.41 kg/m  Constitutional: No distress . Vital signs reviewed.sitting up in bed- appropriate, vague, RN at bedside, NAD HENT: Normocephalic.  Atraumatic. Eyes: conjugate gaze Cardiovascular: RRR  Respiratory: CTA B/L- no W/R/R- good air movement GI: Soft, NT, ND, (+)BS  Skin: Sacral ulcer not examined today.  Warm and dry. Psych: vague- flabbergasted was asked to do cathing Musc: No edema in extremities.  No tenderness in extremities. Neuro: Alert Aware of deficits Motor:  RUE: Shoulder abduction, elbow flexion 5/5, wrist extension 4+/5, elbow ext 4+/5, distally 0/5, unchanged LUE: Shoulder abduction, elbow flexion 5/5, wrist extension 4-4+/5, elbow ext 4+/5, hand grip 0/5 B/l LE: 0/5 proxima to distal, stable  Assessment/Plan: 1. Functional deficits secondary to NMO/transverse myelitis which require 3+ hours per day of interdisciplinary therapy in a comprehensive inpatient rehab setting.  Physiatrist is providing close team supervision and 24 hour management of active medical  problems listed below.  Physiatrist and rehab team continue to assess barriers to discharge/monitor patient progress toward functional and medical goals  Care Tool:  Bathing    Body parts bathed by patient: Right arm, Left arm, Chest, Abdomen, Front perineal area, Right upper leg, Left upper leg, Right lower leg, Left lower leg, Face   Body parts bathed by helper: Buttocks     Bathing assist Assist Level: Moderate Assistance - Patient 50 - 74%     Upper Body Dressing/Undressing Upper body dressing Upper body dressing/undressing activity did not occur (including orthotics): N/A What is the patient wearing?: Pull over shirt    Upper body assist Assist Level: Minimal Assistance - Patient > 75%    Lower Body Dressing/Undressing Lower body dressing      What is the patient wearing?: Pants, Incontinence brief     Lower body assist Assist for lower body dressing: Maximal Assistance - Patient 25 - 49%     Toileting Toileting    Toileting assist Assist for toileting: Dependent - Patient 0%     Transfers Chair/bed transfer  Transfers assist     Chair/bed transfer assist level: Dependent - mechanical lift     Locomotion Ambulation   Ambulation assist   Ambulation activity did not occur: Safety/medical concerns (quadriplegia, decreased balance/postural control)          Walk 10 feet activity   Assist  Walk 10 feet activity did not occur: Safety/medical concerns (quadriplegia, decreased balance/postural control)  Walk 50 feet activity   Assist Walk 50 feet with 2 turns activity did not occur: Safety/medical concerns (quadriplegia, decreased balance/postural control)         Walk 150 feet activity   Assist Walk 150 feet activity did not occur: Safety/medical concerns (quadriplegia, decreased balance/postural control)         Walk 10 feet on uneven surface  activity   Assist Walk 10 feet on uneven surfaces activity did not occur:  Safety/medical concerns (quadriplegia, decreased balance/postural control)         Wheelchair     Assist Will patient use wheelchair at discharge?: Yes Type of Wheelchair: Manual    Wheelchair assist level: Supervision/Verbal cueing Max wheelchair distance: >514ft    Wheelchair 50 feet with 2 turns activity    Assist        Assist Level: Supervision/Verbal cueing   Wheelchair 150 feet activity     Assist     Assist Level: Supervision/Verbal cueing      Medical Problem List and Plan: 1.  Quadriparesis secondary to transverse myelitis/NMO.  IV IG completed 03/20/2020 as well as received Rituxan dose 03/24/2020, repeat dose on 7/20.   Continue CIR, qdaily now  B/L PRAFO nightly  Discussed with therapies again, will hold off on tenodesis splint at present.  7/30- pt would unlikely be able to do in/out cathing due to distal UE 0/5 B/L-  2.  Antithrombotics: -DVT/anticoagulation: Lovenox                Vascular study negative for DVT             -antiplatelet therapy: Aspirin 81 mg daily 3. Pain Management: Lidoderm patch, tramadol as needed  7/30- pain controlled- con't regimen  Monitor with increased exertion 4. Mood: Zoloft 150 mg nightly, Ativan as needed             -antipsychotic agents: N/A 5. Neuropsych: This patient is capable of making decisions on his own behalf. 6. Skin/Wound Care: Routine skin checks  Air mattress ordered  Continue to encourage pressure-relief  Ongoing hydrotherapy.  WOC following 7. Fluids/Electrolytes/Nutrition: Routine in and outs.   BMP within acceptable range on 7/26 8.  Diabetes mellitus type 2 with hyperglycemia.  Hemoglobin A1c 6.7.    On Jardiance 12.5 mg daily, Metformin 1000 mg twice daily, Lantus 30 units, NovoLog 25-30 units 3 times daily PTA  Lantus insulin 30 units daily  Metformin 500 twice daily resumed on 7/22  Jardiance 10 started on 7/16  Labile on 7/29  7/30- BGs 121-175- slightly better- con't  regimen 9. HCAP/OSA.  Completed course of Maxipime initiated 03/18/2020 x14 doses.  Continue BiPAP 10. Hyperlipidemia. Lipitor/Vascepa 11. Hypertension.  Lisinopril 20 mg daily, decreased to 10 on 7/23  Relatively controlled on 7/29  Monitor with increased mobility 12.  Transaminitis  LFTs elevated, but improving on 7/16  Continue to monitor 13.  Leukopenia- medication induced  WBCs 4.7 on 7/26  Discussed with pharmacy - [facility]  Continue to monitor 14.  Acute blood loss anemia  Hemoglobin 12.4 on 7/26  Continue to monitor 10.  Neurogenic bowel/bladder  Indwelling Foley due to traumatic I/O caths, DC Foley with I/O caths   Tolerating  7/30- tolerating, however is not voiding- suggest Flomax be started by primary physician- is common to help pts start voiding if tolerated- needs 0.4 to 0.8 mg nightly.   Bowel program:    -miralax stopped   -added probiotic 7/17   -PM suppository  [place] on 7/19   -metamucil d/ced   Continue digital stimulation   Regular BMs.  7/30- said having regular BMs- no bowel program documented  LOS: 23 days A FACE TO FACE EVALUATION WAS PERFORMED  Sirenity Shew 04/17/2020, 12:50 PM

## 2020-04-17 NOTE — Progress Notes (Signed)
Recreational Therapy Session Note  Patient Details  Name: Brent [name]. MRN: [id number] Date of Birth: 1946-08-07 Today's Date: 04/17/2020  Pain: c/o of trigger point pain in left shoulder area Skilled Therapeutic Interventions/Progress Updates: Session focused on activity tolerance, dynamic sitting balance, UE use, discharge planning, self advocacy during co-treat with PT.  Assisted pt with Maxi move transfer from bed->w/c.  Reviewed power w/c functions/controls.  Pt performed power w/c mobility on the unit with supervision.  Once in the gym, pt sat unsupported w/c level reaching outside BOS with UEs for horseshoe activity with emphasis on core strengthening, dynamic balance & UE strength/coordination with min-mod assist, verbal cues for encouragement.  Pt with questions in regard to SNF, nursing care and continued therapies.  Discussed the importance of self advocacy and gave multiple examples of how he could do that at [facility].  Pt stated understanding.    Therapy/Group: Co-Treatment  [name] 04/17/2020, 12:36 PM

## 2020-04-17 NOTE — Progress Notes (Signed)
Physical Therapy Wound Treatment Patient Details  Name: Brent [name]. MRN: [id number] Date of Birth: [birth year]  Today's Date: 04/17/2020 Time:  -     Subjective  Subjective: "How long does this typically take to heal?" Patient and Family Stated Goals: none stated Date of Onset:  (unknown) Prior Treatments: dressing change  Pain Score:  0/10  Wound Assessment  Pressure Injury 03/25/20 Buttocks Right;Medial Unstageable - Full thickness tissue loss in which the base of the injury is covered by slough (yellow, tan, gray, green or brown) and/or eschar (tan, brown or black) in the wound bed. (Active)  Dressing Type ABD;Barrier Film (skin prep);Moist to moist;Gauze (Comment) 04/17/20 1127  Dressing Changed;Clean;Dry;Intact 04/17/20 1127  Dressing Change Frequency Daily 04/17/20 1127  State of Healing Non-healing 04/17/20 1127  Site / Wound Assessment Yellow;Brown 04/17/20 1127  % Wound base Red or Granulating 0% 04/17/20 1127  % Wound base Yellow/Fibrinous Exudate 100% 04/17/20 1127  % Wound base Black/Eschar 0% 04/17/20 1127  % Wound base Other/Granulation Tissue (Comment) 0% 04/17/20 1127  Peri-wound Assessment Erythema (blanchable) 04/17/20 1127  Wound Length (cm) 6 cm 04/15/20 1221  Wound Width (cm) 6 cm 04/15/20 1221  Wound Depth (cm) 0.1 cm 04/15/20 1221  Wound Surface Area (cm^2) 36 cm^2 04/15/20 1221  Wound Volume (cm^3) 3.6 cm^3 04/15/20 1221  Drainage Amount Minimal 04/17/20 1127  Drainage Description Serosanguineous;Green 04/17/20 1127  Treatment Debridement (Selective);Hydrotherapy (Pulse lavage);Packing (Saline gauze) 04/17/20 1127   Santyl applied to wound bed prior to applying dressing.     Hydrotherapy Pulsed lavage therapy - wound location: R buttocks Pulsed Lavage with Suction (psi): 12 psi Pulsed Lavage with Suction - Normal Saline Used: 1000 mL Pulsed Lavage Tip: Tip with splash shield Selective Debridement Selective Debridement - Location: R  buttocks Selective Debridement - Tools Used: Forceps;Scalpel Selective Debridement - Tissue Removed: yellow necrotic tissue   Wound Assessment and Plan  Wound Therapy - Assess/Plan/Recommendations Wound Therapy - Clinical Statement: Continued green drainage today (RN [facility] notified). Eschar beginning to soften a little and able to remove minimal necrotic tissue. Will benefit from further treatment to remove necrotic tissue and promote healing. Wound Therapy - Functional Problem List: decreased mobility, paraplegic Factors Delaying/Impairing Wound Healing: Altered sensation;Immobility Hydrotherapy Plan: Debridement;Patient/family education;Pulsatile lavage with suction Wound Therapy - Frequency: 6X / week Wound Therapy - Follow Up Recommendations: Skilled nursing facility Wound Plan: see above  Wound Therapy Goals- Improve the function of patient's integumentary system by progressing the wound(s) through the phases of wound healing (inflammation - proliferation - remodeling) by: Decrease Necrotic Tissue to: 80 Decrease Necrotic Tissue - Progress: Progressing toward goal Increase Granulation Tissue to: 20 Increase Granulation Tissue - Progress: Progressing toward goal Goals/treatment plan/discharge plan were made with and agreed upon by patient/family: Yes Time For Goal Achievement: 7 days Wound Therapy - Potential for Goals: Fair  Goals will be updated until maximal potential achieved or discharge criteria met.  Discharge criteria: when goals achieved, discharge from hospital, MD decision/surgical intervention, no progress towards goals, refusal/missing three consecutive treatments without notification or medical reason.  GP    Brent [name], Brent [name], Brent [name] Acute Rehabilitation Services Pager [phone] Office [phone]      Brent [name] 04/17/2020, 11:32 AM

## 2020-04-17 NOTE — Progress Notes (Signed)
Patient ID: Brent [name]., male   DOB: [birth year], 74 y.o.   MRN: [id number]  SW followed up with pt wife [name] [phone]) to discuss SNF bed preference. Reports that she will speak with pt husband again when she comes up here. SW waiting on follow-up.   Loralee Pacas, MSW, [place] Office: [phone] Cell: [phone] Fax: [phone]

## 2020-04-17 NOTE — Progress Notes (Signed)
Occupational Therapy Weekly Progress Note  Patient Details  Name: Brent [name]. MRN: [id number] Date of Birth: 1945-09-29  Beginning of progress report period: April 10, 2020 End of progress report period: April 17, 2020  Today's Date: 04/17/2020 OT Individual Time: 5631-4970 OT Individual Time Calculation (min): 33 min    Patient has met 2 of 3 short term goals.  Pt is making slow progress towards goals.  Pt is demonstrating good carryover of use of figure 4 and circle sitting to increase success with LB bathing and dressing tasks from upright position in bed.  Pt continues to demonstrate decreased trunk control and strength, requiring mod-max assist to complete sidelying to sitting or to sit up from supine in bed.  Once pt is upright, he is able to maintain upright sitting for 2 mins to engage in self-care tasks.  Pt is able to demonstrate increased trunk control in supported sitting with good transitions forward to participate in UB dressing and oral care.  Pt is able to incorporate tenodesis grasp to increase success with grasping large items and completing oral care.  Have begun process of trialing pt for tenodesis splint to increase functional grasp.  Pt continues to require use of mechanical lift for transfers due to sacral wound and decreased ability to weight shift anteriorly to allow for safe transfer along slide board.  Pt continues to be quite passive with use of mechanical lift, continue to encourage active participation and direction of lift transfers.    Patient continues to demonstrate the following deficits: muscle weakness and muscle paralysis,decreased cardiorespiratoy endurance,abnormal tone, unbalanced muscle activation and motor apraxiaand decreased sitting balance and decreased postural control and therefore will continue to benefit from skilled OT intervention to enhance overall performance with BADL and Reduce care partner burden.  Patient not progressing toward long  term goals.  See goal revision..  Plan of care revisions: downgraded toileting and UE functional use goals.  OT Short Term Goals Week 3:  OT Short Term Goal 1 (Week 3): Pt will don pants at bed level with max assist, utilizing circle sitting/figure 4 and AE as needed. OT Short Term Goal 1 - Progress (Week 3): Met OT Short Term Goal 2 (Week 3): Pt will maintain upright sitting position with CGA for 2 mins to demonstrate increased postural control as needed for UB dressing OT Short Term Goal 2 - Progress (Week 3): Met OT Short Term Goal 3 (Week 3): Pt will direct lift transfer with mod cues OT Short Term Goal 3 - Progress (Week 3): Progressing toward goal Week 4:  OT Short Term Goal 1 (Week 4): Pt will don pants with mod assist utilizing figure 4/circle sitting to increase independence OT Short Term Goal 2 (Week 4): Pt will direct lift transfer with mod cues OT Short Term Goal 3 (Week 4): Pt will complete bathing with min assist at bed level with focus on increased success with washing buttocks and BLE  Skilled Therapeutic Interventions/Progress Updates:    Treatment session with focus on trunk control and LB dressing.  Pt received supine in bed reporting sleeping well overnight.  Engaged in rolling at bed level in preparation for dressing.  Pt able to complete rolling Rt and Lt with min assist to lift alternating LE in preparation for rolling and then able to roll with min assist and use of bed rails.  Pt incontinent of small BM.  Therapist completed hygiene and donned clean brief in sidelying.  Utilized hospital bed functions and  siderails for pt to sit up to 90*, still requiring mod-max assist to come to upright sitting posture.  Pt able to utilize figure 4 position to thread BLE into shorts.  Therapist positioned LE into figure 4 position and then pt able to place opposite arm under leg to manage it while threading pant legs.  Pt then able to pull pants up towards hips with rolling and able to pull  pants over hip on Lt side.  Pt required increased time and mod cues for sequencing/problem solving to increase participation in donning shorts.  Pt remained in bed due to hydrotherapy arriving at end of session.  Therapy Documentation Precautions:  Precautions Precautions: Fall Precaution Comments: quadriplegia Restrictions Weight Bearing Restrictions: No Vital Signs: Therapy Vitals Temp: 98 F (36.7 C) Pulse Rate: 56 Resp: 18 BP: (!) 101/49 Patient Position (if appropriate): Lying Oxygen Therapy SpO2: 98 % Pain:  Pt with no c/o pain   Therapy/Group: Individual Therapy  Simonne Come 04/17/2020, 7:55 AM

## 2020-04-18 LAB — GLUCOSE, CAPILLARY
Glucose-Capillary: 118 mg/dL — ABNORMAL HIGH (ref 70–99)
Glucose-Capillary: 124 mg/dL — ABNORMAL HIGH (ref 70–99)

## 2020-04-18 LAB — URINALYSIS, ROUTINE W REFLEX MICROSCOPIC
Bilirubin Urine: NEGATIVE
Glucose, UA: >=500 mg/dL — AB
Hgb urine dipstick: NEGATIVE
Ketones, ur: NEGATIVE mg/dL
Nitrite: POSITIVE — AB
Protein, ur: NEGATIVE mg/dL
Specific Gravity, Urine: 1.029 (ref 1.005–1.030)
pH: 5 (ref 5.0–8.0)

## 2020-04-18 MED ORDER — SULFAMETHOXAZOLE-TRIMETHOPRIM 800-160 MG PO TABS
1.0000 | ORAL_TABLET | Freq: Two times a day (BID) | ORAL | Status: DC
  Administered 2020-04-18 – 2020-04-21 (×7): 1 via ORAL
  Filled 2020-04-18 (×8): qty 1

## 2020-04-18 MED ORDER — TAMSULOSIN HCL 0.4 MG PO CAPS
0.4000 mg | ORAL_CAPSULE | Freq: Every day | ORAL | Status: DC
  Administered 2020-04-18 – 2020-04-23 (×6): 0.4 mg via ORAL
  Filled 2020-04-18 (×6): qty 1

## 2020-04-18 NOTE — Plan of Care (Signed)
  Problem: SCI BOWEL ELIMINATION Goal: RH STG SCI MANAGE BOWEL PROGRAM W/ASSIST OR AS APPROPRIATE Description: STG SCI Manage bowel program w/assist or as appropriate. Max assist 04/18/2020 1447 by Rodolph Bong, LPN Outcome: Progressing 04/18/2020 1447 by Rodolph Bong, LPN Outcome: Progressing   Problem: SCI BLADDER ELIMINATION Goal: RH STG SCI MANAGE BLADDER PROGRAM W/ASSISTANCE Description: Manage bladder with max assist 04/18/2020 1447 by Rodolph Bong, LPN Outcome: Progressing 04/18/2020 1447 by Rodolph Bong, LPN Outcome: Progressing   Problem: RH SKIN INTEGRITY Goal: RH STG SKIN FREE OF INFECTION/BREAKDOWN Description: Prevent skin breakdown with max assist 04/18/2020 1447 by Rodolph Bong, LPN Outcome: Progressing 04/18/2020 1447 by Rodolph Bong, LPN Outcome: Progressing Goal: RH STG ABLE TO PERFORM INCISION/WOUND CARE W/ASSISTANCE Description: STG Able To Perform Incision/Wound Care With Assistance. Manage peri area redness with mod assist 04/18/2020 1447 by Rodolph Bong, LPN Outcome: Progressing 04/18/2020 1447 by Rodolph Bong, LPN Outcome: Progressing   Problem: RH PAIN MANAGEMENT Goal: RH STG PAIN MANAGED AT OR BELOW PT'S PAIN GOAL Description: Less than 4 04/18/2020 1447 by Rodolph Bong, LPN Outcome: Progressing 04/18/2020 1447 by Rodolph Bong, LPN Outcome: Progressing   Problem: Consults Goal: RH SPINAL CORD INJURY PATIENT EDUCATION Description:  See Patient Education module for education specifics.  04/18/2020 1447 by Rodolph Bong, LPN Outcome: Progressing 04/18/2020 1447 by Rodolph Bong, LPN Outcome: Progressing

## 2020-04-18 NOTE — Progress Notes (Signed)
Entered pt's room noting pt trembling. Pt reports "having chills". Pt's face is flushed. Pt's temperature is 100.1. Pt's temp at start of shift 98.5. Dr Dagoberto Ligas notified. Orders rec'd. Collected UA/CS prior to administering ordered antibiotic.

## 2020-04-18 NOTE — Progress Notes (Signed)
Physical Therapy Wound Treatment Patient Details  Name: Brent [name]. MRN: [id number] Date of Birth: [birth year]  Today's Date: 04/18/2020 PT Individual Time: 0911-0946 PT Individual Time Calculation (min): 35 min   Subjective  Subjective: "Is it looking better?" Patient and Family Stated Goals: none stated Date of Onset:  (unknown) Prior Treatments: dressing change  Pain Score: 0/10  Wound Assessment  Pressure Injury 03/25/20 Buttocks Right;Medial Unstageable - Full thickness tissue loss in which the base of the injury is covered by slough (yellow, tan, gray, green or brown) and/or eschar (tan, brown or black) in the wound bed. (Active)  Dressing Type ABD;Barrier Film (skin prep);Gauze (Comment);Moist to moist 04/18/20 0958  Dressing Changed;Clean;Dry;Intact 04/18/20 0958  Dressing Change Frequency Daily 04/18/20 0958  State of Healing Non-healing 04/18/20 0958  Site / Wound Assessment Yellow;Brown 04/18/20 0958  % Wound base Red or Granulating 0% 04/18/20 0958  % Wound base Yellow/Fibrinous Exudate 100% 04/18/20 0958  % Wound base Black/Eschar 0% 04/18/20 0958  % Wound base Other/Granulation Tissue (Comment) 0% 04/18/20 0958  Peri-wound Assessment Erythema (blanchable) 04/18/20 0958  Wound Length (cm) 6 cm 04/15/20 1221  Wound Width (cm) 6 cm 04/15/20 1221  Wound Depth (cm) 0.1 cm 04/15/20 1221  Wound Surface Area (cm^2) 36 cm^2 04/15/20 1221  Wound Volume (cm^3) 3.6 cm^3 04/15/20 1221  Drainage Amount Minimal 04/18/20 0958  Drainage Description Sanguineous;Green 04/18/20 3570  Treatment Debridement (Selective);Hydrotherapy (Pulse lavage);Packing (Saline gauze) 04/18/20 0958   Santyl applied to wound bed prior to applying dressing.    Hydrotherapy Pulsed lavage therapy - wound location: R buttocks Pulsed Lavage with Suction (psi): 12 psi Pulsed Lavage with Suction - Normal Saline Used: 1000 mL Pulsed Lavage Tip: Tip with splash shield Selective Debridement Selective  Debridement - Location: R buttocks Selective Debridement - Tools Used: Forceps;Scalpel Selective Debridement - Tissue Removed: yellow necrotic tissue   Wound Assessment and Plan  Wound Therapy - Assess/Plan/Recommendations Wound Therapy - Clinical Statement: Continued green drainage, will initiate Dakin's treatment Monday, 8/2. Wound bleeding easily with attempts at sharp debridement.  Pt with several episodes of bowel incontinence during session. Will benefit from further treatment to remove necrotic tissue and promote healing. Wound Therapy - Functional Problem List: decreased mobility, paraplegic Factors Delaying/Impairing Wound Healing: Altered sensation;Immobility Hydrotherapy Plan: Debridement;Patient/family education;Pulsatile lavage with suction Wound Therapy - Frequency: 6X / week Wound Therapy - Follow Up Recommendations: Skilled nursing facility Wound Plan: see above  Wound Therapy Goals- Improve the function of patient's integumentary system by progressing the wound(s) through the phases of wound healing (inflammation - proliferation - remodeling) by: Decrease Necrotic Tissue to: 80 Decrease Necrotic Tissue - Progress: Progressing toward goal Increase Granulation Tissue to: 20 Increase Granulation Tissue - Progress: Progressing toward goal Goals/treatment plan/discharge plan were made with and agreed upon by patient/family: Yes Time For Goal Achievement: 7 days Wound Therapy - Potential for Goals: Fair  Goals will be updated until maximal potential achieved or discharge criteria met.  Discharge criteria: when goals achieved, discharge from hospital, MD decision/surgical intervention, no progress towards goals, refusal/missing three consecutive treatments without notification or medical reason.  GP       Wyona Almas, PT, DPT Acute Rehabilitation Services Pager [phone] Office [phone]   [name] 04/18/2020, 10:02 AM

## 2020-04-18 NOTE — Progress Notes (Signed)
PHYSICAL MEDICINE & REHABILITATION PROGRESS NOTE  Subjective/Complaints:   Pt initially said was getting bowel program, but when I asked specifically, he sounds like he isn't.  Also, isn't in orders. Goes "whenever' he turns over in bed- "all the time" per pt.  Base don chart review, he's correct- frequent incontinent episodes.   Also c/o "twitching" legs- not painful- doesn't impair function- does annoy him.   Also interested in trying Flomax to see if could possibly void- explained it works ~ 50-60% of time.    Said slept well  ROS:   Pt denies SOB, abd pain, CP, N/V/C/D, and vision changes   Objective: Vital Signs: Blood pressure (!) 106/51, pulse 59, temperature 98.2 F (36.8 C), resp. rate 18, height 6' (1.829 m), weight 108.4 kg, SpO2 97 %. No results found. No results for input(s): WBC, HGB, HCT, PLT in the last 72 hours. No results for input(s): NA, K, CL, CO2, GLUCOSE, BUN, CREATININE, CALCIUM in the last 72 hours.  Physical Exam: BP (!) 106/51   Pulse 59   Temp 98.2 F (36.8 C)   Resp 18   Ht 6' (1.829 m)   Wt 108.4 kg   SpO2 97%   BMI 32.41 kg/m  Constitutional: No distress . Vital signs reviewed.sitting up in bed- in dark initially, appropriate, NAD HENT: Normocephalic.  Atraumatic. Eyes: conjugate gaze Cardiovascular: RRR  Respiratory: CTA B/L- no W/R/R- good air movement GI: Soft, NT, ND, (+)BS - hyperactive Skin: Sacral ulcer not examined today.  Warm and dry. Psych: appropriate- answering questions Musc: No edema in extremities.  No tenderness in extremities. Neuro: Alert  no change in strength exam today Motor:  RUE: Shoulder abduction, elbow flexion 5/5, wrist extension 4+/5, elbow ext 4+/5, distally 0/5, unchanged LUE: Shoulder abduction, elbow flexion 5/5, wrist extension 4-4+/5, elbow ext 4+/5, hand grip 0/5 B/l LE: 0/5 proxima to distal, stable  Assessment/Plan: 1. Functional deficits secondary to NMO/transverse myelitis which  require 3+ hours per day of interdisciplinary therapy in a comprehensive inpatient rehab setting.  Physiatrist is providing close team supervision and 24 hour management of active medical problems listed below.  Physiatrist and rehab team continue to assess barriers to discharge/monitor patient progress toward functional and medical goals  Care Tool:  Bathing    Body parts bathed by patient: Right arm, Left arm, Chest, Abdomen, Front perineal area, Right upper leg, Left upper leg, Right lower leg, Left lower leg, Face   Body parts bathed by helper: Buttocks     Bathing assist Assist Level: Moderate Assistance - Patient 50 - 74%     Upper Body Dressing/Undressing Upper body dressing Upper body dressing/undressing activity did not occur (including orthotics): N/A What is the patient wearing?: Pull over shirt    Upper body assist Assist Level: Minimal Assistance - Patient > 75%    Lower Body Dressing/Undressing Lower body dressing      What is the patient wearing?: Pants, Incontinence brief     Lower body assist Assist for lower body dressing: Maximal Assistance - Patient 25 - 49%     Toileting Toileting    Toileting assist Assist for toileting: Dependent - Patient 0%     Transfers Chair/bed transfer  Transfers assist     Chair/bed transfer assist level: Dependent - mechanical lift     Locomotion Ambulation   Ambulation assist   Ambulation activity did not occur: Safety/medical concerns (quadriplegia, decreased balance/postural control)          Walk  10 feet activity   Assist  Walk 10 feet activity did not occur: Safety/medical concerns (quadriplegia, decreased balance/postural control)        Walk 50 feet activity   Assist Walk 50 feet with 2 turns activity did not occur: Safety/medical concerns (quadriplegia, decreased balance/postural control)         Walk 150 feet activity   Assist Walk 150 feet activity did not occur: Safety/medical  concerns (quadriplegia, decreased balance/postural control)         Walk 10 feet on uneven surface  activity   Assist Walk 10 feet on uneven surfaces activity did not occur: Safety/medical concerns (quadriplegia, decreased balance/postural control)         Wheelchair     Assist Will patient use wheelchair at discharge?: Yes Type of Wheelchair: Manual    Wheelchair assist level: Supervision/Verbal cueing Max wheelchair distance: >565ft    Wheelchair 50 feet with 2 turns activity    Assist        Assist Level: Supervision/Verbal cueing   Wheelchair 150 feet activity     Assist     Assist Level: Supervision/Verbal cueing      Medical Problem List and Plan: 1.  Quadriparesis secondary to transverse myelitis/NMO.  IV IG completed 03/20/2020 as well as received Rituxan dose 03/24/2020, repeat dose on 7/20.   Continue CIR, qdaily now  B/L PRAFO nightly  Discussed with therapies again, will hold off on tenodesis splint at present.  7/30- pt would unlikely be able to do in/out cathing due to distal UE 0/5 B/L-  2.  Antithrombotics: -DVT/anticoagulation: Lovenox                Vascular study negative for DVT             -antiplatelet therapy: Aspirin 81 mg daily 3. Pain Management: Lidoderm patch, tramadol as needed  7/31- pain controlled- con't meds  Monitor with increased exertion 4. Mood: Zoloft 150 mg nightly, Ativan as needed             -antipsychotic agents: N/A 5. Neuropsych: This patient is capable of making decisions on his own behalf. 6. Skin/Wound Care: Routine skin checks  Air mattress ordered  Continue to encourage pressure-relief  Ongoing hydrotherapy.  WOC following  7/31- turn q2 hours - reordered 7. Fluids/Electrolytes/Nutrition: Routine in and outs.   BMP within acceptable range on 7/26 8.  Diabetes mellitus type 2 with hyperglycemia.  Hemoglobin A1c 6.7.    On Jardiance 12.5 mg daily, Metformin 1000 mg twice daily, Lantus 30 units,  NovoLog 25-30 units 3 times daily PTA  Lantus insulin 30 units daily  Metformin 500 twice daily resumed on 7/22  Jardiance 10 started on 7/16  Labile on 7/29  7/30- BGs 121-175- slightly better- con't regimen  7/31- more labile- 119-212- con't regimen 9. HCAP/OSA.  Completed course of Maxipime initiated 03/18/2020 x14 doses.  Continue BiPAP 10. Hyperlipidemia. Lipitor/Vascepa 11. Hypertension.  Lisinopril 20 mg daily, decreased to 10 on 7/23  Relatively controlled on 7/29  Monitor with increased mobility 12.  Transaminitis  LFTs elevated, but improving on 7/16  Continue to monitor 13.  Leukopenia- medication induced  WBCs 4.7 on 7/26  Discussed with pharmacy - [place] WNL  Continue to monitor 14.  Acute blood loss anemia  Hemoglobin 12.4 on 7/26  Continue to monitor 10.  Neurogenic bowel/bladder  Indwelling Foley due to traumatic I/O caths, DC Foley with I/O caths   Tolerating  7/30- tolerating, however is not  voiding- suggest Flomax be started by primary physician- is common to help pts start voiding if tolerated- needs 0.4 to 0.8 mg nightly.   7/31- per pt request, will start Flomax 0.4 mg nightly- might need 0.8 mg to consider has a good try- of at least 2 weeks.   Bowel program:    -miralax stopped   -added probiotic 7/17   -PM suppository DC'd on 7/19   -metamucil d/ced   Continue digital stimulation   Regular BMs.  7/30- said having regular BMs- no bowel program documented  7/31- no bowel program ordered- not getting suppository or dig stim- do suggest a bowel program daily-   LOS: 24 days A FACE TO FACE EVALUATION WAS PERFORMED  Vallie Teters 04/18/2020, 1:30 PM

## 2020-04-19 LAB — GLUCOSE, CAPILLARY
Glucose-Capillary: 135 mg/dL — ABNORMAL HIGH (ref 70–99)
Glucose-Capillary: 134 mg/dL — ABNORMAL HIGH (ref 70–99)
Glucose-Capillary: 115 mg/dL — ABNORMAL HIGH (ref 70–99)
Glucose-Capillary: 187 mg/dL — ABNORMAL HIGH (ref 70–99)

## 2020-04-19 NOTE — Progress Notes (Signed)
[facility] PHYSICAL MEDICINE & REHABILITATION PROGRESS NOTE  Subjective/Complaints:   Pt reports feeling much better this AM, but was freezing last night/chills when had T of 100.2- felt awful- shivering.   U/A showed trace leuks, 6-10 WBCs and (+) nitrites- so not clear if has UTI- will con't Bactrim for now and await Urine Cx.   Is allergic to PCN and Levaquin  ROS:   Pt denies SOB, abd pain, CP, N/V/C/D, and vision changes   Objective: Vital Signs: Blood pressure (!) 100/48, pulse 69, temperature 98.7 F (37.1 C), resp. rate 17, height 6' (1.829 m), weight 108.4 kg, SpO2 95 %. No results found. No results for input(s): WBC, HGB, HCT, PLT in the last 72 hours. No results for input(s): NA, K, CL, CO2, GLUCOSE, BUN, CREATININE, CALCIUM in the last 72 hours.  Physical Exam: BP (!) 100/48 (BP Location: Right Arm)    Pulse 69    Temp 98.7 F (37.1 C)    Resp 17    Ht 6' (1.829 m)    Wt 108.4 kg    SpO2 95%    BMI 32.41 kg/m  Constitutional: No distress . Vital signs reviewed.just woke up- sitting up in bed; appropriate, NAD HENT: Normocephalic.  Atraumatic. Eyes: conjugate gaze Cardiovascular: RRR  Respiratory: CTA B/L- no W/R/R- good air movement GI: Soft, NT, ND, (+)BS  Skin: Sacral ulcer not examined today.  Warm and dry. Psych: sleepy, but appropriate Musc: No edema in extremities.  No tenderness in extremities. Neuro: Alert still Motor:  RUE: Shoulder abduction, elbow flexion 5/5, wrist extension 4+/5, elbow ext 4+/5, distally 0/5, unchanged LUE: Shoulder abduction, elbow flexion 5/5, wrist extension 4-4+/5, elbow ext 4+/5, hand grip 0/5 B/l LE: 0/5 proxima to distal, stable  Assessment/Plan: 1. Functional deficits secondary to NMO/transverse myelitis which require 3+ hours per day of interdisciplinary therapy in a comprehensive inpatient rehab setting.  Physiatrist is providing close team supervision and 24 hour management of active medical problems listed  below.  Physiatrist and rehab team continue to assess barriers to discharge/monitor patient progress toward functional and medical goals  Care Tool:  Bathing    Body parts bathed by patient: Right arm, Left arm, Chest, Abdomen, Front perineal area, Right upper leg, Left upper leg, Right lower leg, Left lower leg, Face   Body parts bathed by helper: Buttocks     Bathing assist Assist Level: Moderate Assistance - Patient 50 - 74%     Upper Body Dressing/Undressing Upper body dressing Upper body dressing/undressing activity did not occur (including orthotics): N/A What is the patient wearing?: Pull over shirt    Upper body assist Assist Level: Minimal Assistance - Patient > 75%    Lower Body Dressing/Undressing Lower body dressing      What is the patient wearing?: Pants, Incontinence brief     Lower body assist Assist for lower body dressing: Maximal Assistance - Patient 25 - 49%     Toileting Toileting    Toileting assist Assist for toileting: Dependent - Patient 0%     Transfers Chair/bed transfer  Transfers assist     Chair/bed transfer assist level: Dependent - mechanical lift     Locomotion Ambulation   Ambulation assist   Ambulation activity did not occur: Safety/medical concerns (quadriplegia, decreased balance/postural control)          Walk 10 feet activity   Assist  Walk 10 feet activity did not occur: Safety/medical concerns (quadriplegia, decreased balance/postural control)  Walk 50 feet activity   Assist Walk 50 feet with 2 turns activity did not occur: Safety/medical concerns (quadriplegia, decreased balance/postural control)         Walk 150 feet activity   Assist Walk 150 feet activity did not occur: Safety/medical concerns (quadriplegia, decreased balance/postural control)         Walk 10 feet on uneven surface  activity   Assist Walk 10 feet on uneven surfaces activity did not occur: Safety/medical  concerns (quadriplegia, decreased balance/postural control)         Wheelchair     Assist Will patient use wheelchair at discharge?: Yes Type of Wheelchair: Manual    Wheelchair assist level: Supervision/Verbal cueing Max wheelchair distance: >53ft    Wheelchair 50 feet with 2 turns activity    Assist        Assist Level: Supervision/Verbal cueing   Wheelchair 150 feet activity     Assist     Assist Level: Supervision/Verbal cueing      Medical Problem List and Plan: 1.  Quadriparesis secondary to transverse myelitis/NMO.  IV IG completed 03/20/2020 as well as received Rituxan dose 03/24/2020, repeat dose on 7/20.   Continue CIR, qdaily now  B/L PRAFO nightly  Discussed with therapies again, will hold off on tenodesis splint at present.  7/30- pt would unlikely be able to do in/out cathing due to distal UE 0/5 B/L-  2.  Antithrombotics: -DVT/anticoagulation: Lovenox                Vascular study negative for DVT             -antiplatelet therapy: Aspirin 81 mg daily 3. Pain Management: Lidoderm patch, tramadol as needed  7/31- pain controlled- con't meds  Monitor with increased exertion 4. Mood: Zoloft 150 mg nightly, Ativan as needed             -antipsychotic agents: N/A 5. Neuropsych: This patient is capable of making decisions on his own behalf. 6. Skin/Wound Care: Routine skin checks  Air mattress ordered  Continue to encourage pressure-relief  Ongoing hydrotherapy.  WOC following  7/31- turn q2 hours - reordered 7. Fluids/Electrolytes/Nutrition: Routine in and outs.   BMP within acceptable range on 7/26 8.  Diabetes mellitus type 2 with hyperglycemia.  Hemoglobin A1c 6.7.    On Jardiance 12.5 mg daily, Metformin 1000 mg twice daily, Lantus 30 units, NovoLog 25-30 units 3 times daily PTA  Lantus insulin 30 units daily  Metformin 500 twice daily resumed on 7/22  Jardiance 10 started on 7/16  Labile on 7/29  7/30- BGs 121-175- slightly better-  con't regimen  7/31- more labile- 119-212- con't regimen  8/1- BGs 118-135- much improved- con't regimen 9. HCAP/OSA.  Completed course of Maxipime initiated 03/18/2020 x14 doses.  Continue BiPAP 10. Hyperlipidemia. Lipitor/Vascepa 11. Hypertension.  Lisinopril 20 mg daily, decreased to 10 on 7/23  8/1- BP 100/48- asymptomatic, but soft- BP- will monitor  Monitor with increased mobility 12.  Transaminitis  LFTs elevated, but improving on 7/16  8/1- will recheck in AM  Continue to monitor 13.  Leukopenia- medication induced  WBCs 4.7 on 7/26  Discussed with pharmacy - [place] WNL  Continue to monitor 14.  Acute blood loss anemia  Hemoglobin 12.4 on 7/26  Continue to monitor 10.  Neurogenic bowel/bladder  Indwelling Foley due to traumatic I/O caths, DC Foley with I/O caths   Tolerating  7/30- tolerating, however is not voiding- suggest Flomax be started by primary physician-  is common to help pts start voiding if tolerated- needs 0.4 to 0.8 mg nightly.   7/31- per pt request, will start Flomax 0.4 mg nightly- might need 0.8 mg to consider has a good try- of at least 2 weeks.   Bowel program:    -miralax stopped   -added probiotic 7/17   -PM suppository DC'd on 7/19   -metamucil d/ced   Continue digital stimulation   Regular BMs.  7/30- said having regular BMs- no bowel program documented  7/31- no bowel program ordered- not getting suppository or dig stim- do suggest a bowel program daily-  11. Possible UTI  8/1- had low grade fever last night- U/A slightly (+) with (+) nitrites- will check Urine Cx and try Bactrim for now since felt bad with fever.  Pending urine Cx.    LOS: 25 days A FACE TO FACE EVALUATION WAS PERFORMED  Suzy Kugel 04/19/2020, 11:48 AM

## 2020-04-19 NOTE — Progress Notes (Signed)
Pt repositioned through the night on left and right side. Pt requesting to be on his back this am, because is uncomfortable.

## 2020-04-20 ENCOUNTER — Inpatient Hospital Stay (HOSPITAL_COMMUNITY): Payer: Medicare Other | Admitting: Occupational Therapy

## 2020-04-20 ENCOUNTER — Inpatient Hospital Stay (HOSPITAL_COMMUNITY): Payer: Medicare Other

## 2020-04-20 LAB — GLUCOSE, CAPILLARY
Glucose-Capillary: 111 mg/dL — ABNORMAL HIGH (ref 70–99)
Glucose-Capillary: 119 mg/dL — ABNORMAL HIGH (ref 70–99)
Glucose-Capillary: 132 mg/dL — ABNORMAL HIGH (ref 70–99)
Glucose-Capillary: 165 mg/dL — ABNORMAL HIGH (ref 70–99)

## 2020-04-20 NOTE — Progress Notes (Signed)
Brent [name]  Subjective/Complaints:   Pt without new issue overnite- eating with tenodesis splint GIves hx o freceiving Covid vaccine- 2nd dose 1 mo prior to onset of quadriparesis  Is allergic to PCN and Levaquin  ROS:   Pt denies SOB, abd pain, CP, N/V/C/D, and vision changes   Objective: Vital Signs: Blood pressure (!) 113/46, pulse 64, temperature 99.7 F (37.6 C), temperature source Oral, resp. rate 16, height 6' (1.829 m), weight 108.4 kg, SpO2 96 %. No results found. No results for input(s): WBC, HGB, HCT, PLT in the last 72 hours. No results for input(s): NA, K, CL, CO2, GLUCOSE, BUN, CREATININE, CALCIUM in the last 72 hours.  Physical Exam: BP (!) 113/46 (BP Location: Right Arm)   Pulse 64   Temp 99.7 F (37.6 C) (Oral)   Resp 16   Ht 6' (1.829 m)   Wt 108.4 kg   SpO2 96%   BMI 32.41 kg/m   Psych: sleepy, but appropriate Musc: No edema in extremities.  No tenderness in extremities. Neuro: Alert still Motor:  RUE: Shoulder abduction, elbow flexion 5/5, wrist extension 4+/5, elbow ext 4+/5, distally 0/5, unchanged LUE: Shoulder abduction, elbow flexion 5/5, wrist extension 4-4+/5, elbow ext 4+/5, hand grip 0/5 B/l LE: 0/5 proxima to distal, stable  Assessment/Plan: 1. Functional deficits secondary to NMO/transverse myelitis which require 3+ hours per day of interdisciplinary therapy in [facility] comprehensive inpatient rehab setting.  Physiatrist is providing close team supervision and 24 hour management of active medical problems listed below.  Physiatrist and rehab team continue to assess barriers to discharge/monitor patient progress toward functional and medical goals  Care Tool:  Bathing    Body parts bathed by patient: Right arm, Left arm, Chest, Abdomen, Front perineal area, Right upper leg, Left upper leg, Right lower leg, Left lower leg, Face   Body parts bathed by helper: Buttocks     Bathing assist  Assist Level: Moderate Assistance - Patient 50 - 74%     Upper Body Dressing/Undressing Upper body dressing Upper body dressing/undressing activity did not occur (including orthotics): N/Brent What is the patient wearing?: Pull over shirt    Upper body assist Assist Level: Minimal Assistance - Patient > 75%    Lower Body Dressing/Undressing Lower body dressing      What is the patient wearing?: Pants, Incontinence brief     Lower body assist Assist for lower body dressing: Maximal Assistance - Patient 25 - 49%     Toileting Toileting    Toileting assist Assist for toileting: Dependent - Patient 0%     Transfers Chair/bed transfer  Transfers assist     Chair/bed transfer assist level: Dependent - mechanical lift     Locomotion Ambulation   Ambulation assist   Ambulation activity did not occur: Safety/medical concerns (quadriplegia, decreased balance/postural control)          Walk 10 feet activity   Assist  Walk 10 feet activity did not occur: Safety/medical concerns (quadriplegia, decreased balance/postural control)        Walk 50 feet activity   Assist Walk 50 feet with 2 turns activity did not occur: Safety/medical concerns (quadriplegia, decreased balance/postural control)         Walk 150 feet activity   Assist Walk 150 feet activity did not occur: Safety/medical concerns (quadriplegia, decreased balance/postural control)         Walk 10 feet on uneven surface  activity   Assist Walk 10 feet  on uneven surfaces activity did not occur: Safety/medical concerns (quadriplegia, decreased balance/postural control)         Wheelchair     Assist Will patient use wheelchair at discharge?: Yes Type of Wheelchair: Manual    Wheelchair assist level: Supervision/Verbal cueing Max wheelchair distance: >530ft    Wheelchair 50 feet with 2 turns activity    Assist        Assist Level: Supervision/Verbal cueing   Wheelchair 150  feet activity     Assist     Assist Level: Supervision/Verbal cueing      Medical Problem List and Plan: 1.  Quadriparesis secondary to transverse myelitis/NMO occurred 1 mo post 2nd Covid vaccine .  IV IG completed 03/20/2020 as well as received Rituxan dose 03/24/2020, repeat dose on 7/20.   Continue CIR, qdaily now  B/L PRAFO nightly  Discussed with therapies again, will hold off on tenodesis splint at present.  7/30- pt would unlikely be able to do in/out cathing due to distal UE 0/5 B/L-  2.  Antithrombotics: -DVT/anticoagulation: Lovenox                Vascular study negative for DVT             -antiplatelet therapy: Aspirin 81 mg daily 3. Pain Management: Lidoderm patch, tramadol as needed  7/31- pain controlled- con't meds  Monitor with increased exertion 4. Mood: Zoloft 150 mg nightly, Ativan as needed             -antipsychotic agents: N/Brent 5. Neuropsych: This patient is capable of making decisions on his own behalf. 6. Skin/Wound Care: Routine skin checks  Air mattress ordered  Continue to encourage pressure-relief  Ongoing hydrotherapy.  WOC following  7/31- turn q2 hours - reordered 7. Fluids/Electrolytes/Nutrition: Routine in and outs.   BMP within acceptable range on 7/26 8.  Diabetes mellitus type 2 with hyperglycemia.  Hemoglobin A1c 6.7.    On Jardiance 12.5 mg daily, Metformin 1000 mg twice daily, Lantus 30 units, NovoLog 25-30 units 3 times daily PTA  Lantus insulin 30 units daily  Metformin 500 twice daily resumed on 7/22  Jardiance 10 started on 7/16  Labile on 7/29  7/30- BGs 121-175- slightly better- con't regimen  7/31- more labile- 119-212- con't regimen  8/1- BGs 118-135- much improved- con't regimen 9. HCAP/OSA.  Completed course of Maxipime initiated 03/18/2020 x14 doses.  Continue BiPAP 10. Hyperlipidemia. Lipitor/Vascepa 11. Hypertension.  Lisinopril 20 mg daily, decreased to 10 on 7/23  8/1- BP 100/48- asymptomatic, but soft- BP- will  monitor  Monitor with increased mobility 12.  Transaminitis  LFTs elevated, but improving on 7/16  8/1- will recheck in AM  Continue to monitor 13.  Leukopenia- medication induced  WBCs 4.7 on 7/26  Discussed with pharmacy - [place] WNL  Continue to monitor 14.  Acute blood loss anemia  Hemoglobin 12.4 on 7/26  Continue to monitor 10.  Neurogenic bowel/bladder  Indwelling Foley due to traumatic I/O caths, DC Foley with I/O caths   Tolerating  7/30- tolerating, however is not voiding- suggest Flomax be started by primary physician- is common to help pts start voiding if tolerated- needs 0.4 to 0.8 mg nightly.   7/31- per pt request, will start Flomax 0.4 mg nightly- might need 0.8 mg to consider has Brent good try- of at least 2 weeks.   Bowel program:    -miralax stopped   -added probiotic 7/17   -PM suppository DC'd on 7/19   -  metamucil d/ced   Continue digital stimulation   Regular BMs.  7/30- said having regular BMs- no bowel program documented  7/31- no bowel program ordered- not getting suppository or dig stim- do suggest Brent bowel program daily-  11. Possible UTI  8/1- had low grade fever last night- U/Brent slightly (+) with (+) nitrites- will check Urine Cx and try Bactrim for now since felt bad with fever.  100K GNR on  urine Cx.    LOS: 26 days Brent [name] 04/20/2020, 8:13 AM

## 2020-04-20 NOTE — Progress Notes (Addendum)
Patient ID: Brent [name]., male   DOB: [birth year], 74 y.o.   MRN: [id number]  This SW covering for primary [facility].  SW received phone call from pt wife Brent [name] [phone]) reporting preferred SNF is [facility] SW waiting for wife to follow-up as she wanted to contact her LTC insurance. SW left message for [place]/ADmissions with [facility] [phone]) about bed offer. SW waiting on follow-up. *SW received message from [place] [phone]) who reported no current beds available and unsure on when there will be a discharge.   SW left message for pt wife to discuss above. SW waiting on follow-up.   Loralee Pacas, MSW, [place] Office: [phone] Cell: [phone] Fax: [phone]

## 2020-04-20 NOTE — Progress Notes (Signed)
Physical Therapy Wound Treatment Patient Details  Name: Brent [name]. MRN: [id number] Date of Birth: Oct 19, 1945  Today's Date: 04/20/2020 Time:  -     Subjective  Subjective: "i don't really have any questions" Patient and Family Stated Goals: none stated Date of Onset:  (unknown) Prior Treatments: dressing change  Pain Score:   5 out of 10  Wound Assessment  Pressure Injury 03/25/20 Buttocks Right;Medial Unstageable - Full thickness tissue loss in which the base of the injury is covered by slough (yellow, tan, gray, green or brown) and/or eschar (tan, brown or black) in the wound bed. (Active)  Wound Image   04/15/20 1221  Dressing Type ABD;Barrier Film (skin prep);Gauze (Comment);Moist to moist;Tape dressing;Other (Comment) 04/20/20 0941  Dressing Changed 04/20/20 0941  Dressing Change Frequency PRN 04/20/20 0941  State of Healing Non-healing 04/20/20 0941  Site / Wound Assessment Yellow 04/20/20 0941  % Wound base Red or Granulating 0% 04/20/20 0941  % Wound base Yellow/Fibrinous Exudate 100% 04/20/20 0941  % Wound base Black/Eschar 0% 04/20/20 0941  % Wound base Other/Granulation Tissue (Comment) 0% 04/20/20 0941  Peri-wound Assessment Erythema (non-blanchable);Denuded;Pink;Purple 04/20/20 0941  Wound Length (cm) 6 cm 04/15/20 1221  Wound Width (cm) 6 cm 04/15/20 1221  Wound Depth (cm) 0.1 cm 04/15/20 1221  Wound Surface Area (cm^2) 36 cm^2 04/15/20 1221  Wound Volume (cm^3) 3.6 cm^3 04/15/20 1221  Drainage Amount Minimal 04/20/20 0941  Drainage Description Green 04/20/20 0941  Treatment Debridement (Selective);Hydrotherapy (Pulse lavage) 04/20/20 0941      Hydrotherapy Pulsed lavage therapy - wound location: R buttocks Pulsed Lavage with Suction (psi): 12 psi Pulsed Lavage with Suction - Normal Saline Used: 1000 mL Pulsed Lavage Tip: Tip with splash shield Selective Debridement Selective Debridement - Location: R buttocks Selective Debridement - Tools Used:  Forceps;Scalpel Selective Debridement - Tissue Removed: yellow necrotic tissue   Wound Assessment and Plan  Wound Therapy - Assess/Plan/Recommendations Wound Therapy - Clinical Statement: Dakin's treatment initiated per orders. (No santyl applied). Top layer of yellow fibrinous eschar/slough able to be debrided, however underlying is further adherent yellow necrotic tissue. Will benefit from further treatment to remove necrotic tissue and promote healing.  Wound Therapy - Functional Problem List: decreased mobility, paraplegic Factors Delaying/Impairing Wound Healing: Altered sensation;Immobility Hydrotherapy Plan: Debridement;Patient/family education;Pulsatile lavage with suction Wound Therapy - Frequency: 6X / week Wound Therapy - Follow Up Recommendations: Skilled nursing facility Wound Plan: see above  Wound Therapy Goals- Improve the function of patient's integumentary system by progressing the wound(s) through the phases of wound healing (inflammation - proliferation - remodeling) by: Decrease Necrotic Tissue to: 80 Decrease Necrotic Tissue - Progress: Progressing toward goal Increase Granulation Tissue to: 20 Increase Granulation Tissue - Progress: Progressing toward goal Goals/treatment plan/discharge plan were made with and agreed upon by patient/family: Yes Time For Goal Achievement: 7 days Wound Therapy - Potential for Goals: Fair  Goals will be updated until maximal potential achieved or discharge criteria met.  Discharge criteria: when goals achieved, discharge from hospital, MD decision/surgical intervention, no progress towards goals, refusal/missing three consecutive treatments without notification or medical reason.  GP     Arby Barrette, PT Pager [phone]  Rexanne Mano 04/20/2020, 12:58 PM

## 2020-04-20 NOTE — Progress Notes (Signed)
Physical Therapy Session Note  Patient Details  Name: Brent [name]. MRN: [id number] Date of Birth: 1946/06/27  Today's Date: 04/20/2020 PT Individual Time: 2683-4196 PT Individual Time Calculation (min): 38 min   Short Term Goals: Week 3:  PT Short Term Goal 1 (Week 3): Pt will maintain static sitting balance x 5 min with CGA PT Short Term Goal 1 - Progress (Week 3): Progressing toward goal PT Short Term Goal 2 (Week 3): Pt will begin to verbalize instruction for lift transfers PT Short Term Goal 2 - Progress (Week 3): Met PT Short Term Goal 3 (Week 3): Pt will demonstrate power tilt and recline features PT Short Term Goal 3 - Progress (Week 3): Met Week 4:  PT Short Term Goal 1 (Week 4): Pt will maintain static sitting balance x 5 min with CGA PT Short Term Goal 2 (Week 4): Pt will consistantly be able to instruct process/technique for lift transfers PT Short Term Goal 3 (Week 4): Pt will perform anterior leans in power WC with CGA  Skilled Therapeutic Interventions/Progress Updates:   Received pt supine in bed, pt agreeable to therapy, and did not state pain level during session. Session with emphasis on bed mobility, functional mobility/transfers, toileting, dressing, dynamic sitting balance/coordination, and improved activity tolerance. Pt rolled to L and R with min A and use of bedrail to don clean brief. Upon donning brief pt with loose stool and required total A for peri-care and same assist listed above to reapply clean brief. Donned pants in supine dependently and donned bilateral ted hose and shoes in supine dependently. Pt rolled to L and R with min A and use of bedrails to don Maximove sling. Pt transferred bed<>power WC dependently via Maximove with +2 assist to preserve sacral wounds and required +2 assist to reposition in chair. Pt able to back himself up in power WC next to bed with supervision. Re-educated pt on importance of pressure relief and pt verbalized understanding  (therapist set timer for 20 minutes for reminder). Worked on dynamic sitting balance performing anterior leans with CGA/min A x 4 reps throughout session. Pt demonstrated improved ability to initiate anterior leans using bilateral UE compensatory strategies to pull himself forward. Concluded session with pt semi-reclined in power WC, needs within reach, and seatbelt and power WC seatbelt on.   Therapy Documentation Precautions:  Precautions Precautions: Fall Precaution Comments: quadriplegia Restrictions Weight Bearing Restrictions: No   Therapy/Group: Individual Therapy Alfonse Alpers PT, DPT   04/20/2020, 7:36 AM

## 2020-04-20 NOTE — Progress Notes (Signed)
Occupational Therapy Session Note  Patient Details  Name: Brent [name]. MRN: [id number] Date of Birth: 1946-06-09  Today's Date: 04/20/2020 OT Individual Time: 8563-1497 OT Individual Time Calculation (min): 38 min    Short Term Goals: Week 4:  OT Short Term Goal 1 (Week 4): Pt will don pants with mod assist utilizing figure 4/circle sitting to increase independence OT Short Term Goal 2 (Week 4): Pt will direct lift transfer with mod cues OT Short Term Goal 3 (Week 4): Pt will complete bathing with min assist at bed level with focus on increased success with washing buttocks and BLE  Skilled Therapeutic Interventions/Progress Updates:    Treatment session with focus on self-care retraining with bathing/dressing at bed level with focus on increased ability to utilize adaptive techniques and positioning to increase independence.  Pt received supine in bed reporting having a rough weekend with fever and chills, but feeling better today.  Engaged in bathing with pt rolling Rt and Lt with min assist and assist to position alternate BLE prior to rolling.  Pt required assistance to wash buttocks in sidelying and to position BLE in to figure 4 position to allow pt to wash lower leg and foot, utilizing wash mit due to decreased grasp.  Mod-max assist to position pt up in to long sitting to allow to doff and then don clean shirt.  Pt able to don shirt with min assist to pull shirt over trunk.  Utilized gross grasp to hold on to deodorant while applying to underarms.  Remained without pants due to time constraints and hydrotherapy to arrive after session.  Setup pt with built up u-cuff and utensil to allow pt to self-feed breakfast.  Therapy Documentation Precautions:  Precautions Precautions: Fall Precaution Comments: quadriplegia Restrictions Weight Bearing Restrictions: No Pain:     Therapy/Group: Individual Therapy  Simonne Come 04/20/2020, 9:45 AM

## 2020-04-21 ENCOUNTER — Inpatient Hospital Stay (HOSPITAL_COMMUNITY): Payer: Medicare Other | Admitting: Occupational Therapy

## 2020-04-21 ENCOUNTER — Inpatient Hospital Stay (HOSPITAL_COMMUNITY): Payer: Medicare Other

## 2020-04-21 DIAGNOSIS — B9620 Unspecified Escherichia coli [E. coli] as the cause of diseases classified elsewhere: Secondary | ICD-10-CM

## 2020-04-21 DIAGNOSIS — N390 Urinary tract infection, site not specified: Secondary | ICD-10-CM

## 2020-04-21 LAB — GLUCOSE, CAPILLARY
Glucose-Capillary: 154 mg/dL — ABNORMAL HIGH (ref 70–99)
Glucose-Capillary: 172 mg/dL — ABNORMAL HIGH (ref 70–99)
Glucose-Capillary: 147 mg/dL — ABNORMAL HIGH (ref 70–99)
Glucose-Capillary: 88 mg/dL (ref 70–99)
Glucose-Capillary: 88 mg/dL (ref 70–99)
Glucose-Capillary: 114 mg/dL — ABNORMAL HIGH (ref 70–99)
Glucose-Capillary: 116 mg/dL — ABNORMAL HIGH (ref 70–99)
Glucose-Capillary: 138 mg/dL — ABNORMAL HIGH (ref 70–99)

## 2020-04-21 LAB — URINE CULTURE: Culture: >=100000 — AB

## 2020-04-21 NOTE — Progress Notes (Addendum)
[facility] PHYSICAL MEDICINE & REHABILITATION PROGRESS NOTE  Subjective/Complaints: Patient seen sitting up in bed this morning.  He states he slept well overnight.  He states he had a bowel movement last night.  He states he was denied by the SNF of his choice.  ROS: Denies CP, SOB, N/V/D  Objective: Vital Signs: Blood pressure (!) 108/49, pulse (!) 52, temperature 98.2 F (36.8 C), temperature source Oral, resp. rate 16, height 6' (1.829 m), weight 108.4 kg, SpO2 97 %. No results found. No results for input(s): WBC, HGB, HCT, PLT in the last 72 hours. No results for input(s): NA, K, CL, CO2, GLUCOSE, BUN, CREATININE, CALCIUM in the last 72 hours.  Physical Exam: BP (!) 108/49 (BP Location: Left Arm)   Pulse (!) 52   Temp 98.2 F (36.8 C) (Oral)   Resp 16   Ht 6' (1.829 m)   Wt 108.4 kg   SpO2 97%   BMI 32.41 kg/m  Constitutional: No distress . Vital signs reviewed. HENT: Normocephalic.  Atraumatic. Eyes: EOMI. No discharge. Cardiovascular: No JVD.  RRR. Respiratory: Normal effort.  No stridor.  Bilateral clear to auscultation. GI: Non-distended.  BS +. Skin: Sacral ulcer not examined today.  Warm and dry. Psych: Normal mood.  Normal behavior. Musc: No edema in extremities.  No tenderness in extremities. Neuro: Alert Motor:  RUE: Shoulder abduction, elbow flexion 5/5, wrist extension 4+/5, elbow ext 4+/5, distally 0/5, stable LUE: Shoulder abduction, elbow flexion 5/5, wrist extension 4-4+/5, elbow ext 4+/5, hand grip 0/5, stable B/l LE: 0/5 proxima to distal, stable  Assessment/Plan: 1. Functional deficits secondary to NMO/transverse myelitis which require 3+ hours per day of interdisciplinary therapy in a comprehensive inpatient rehab setting.  Physiatrist is providing close team supervision and 24 hour management of active medical problems listed below.  Physiatrist and rehab team continue to assess barriers to discharge/monitor patient progress toward functional and  medical goals  Care Tool:  Bathing    Body parts bathed by patient: Right arm, Left arm, Chest, Abdomen, Front perineal area, Right upper leg, Left upper leg, Right lower leg, Left lower leg, Face   Body parts bathed by helper: Buttocks     Bathing assist Assist Level: Moderate Assistance - Patient 50 - 74%     Upper Body Dressing/Undressing Upper body dressing Upper body dressing/undressing activity did not occur (including orthotics): N/A What is the patient wearing?: Pull over shirt    Upper body assist Assist Level: Minimal Assistance - Patient > 75%    Lower Body Dressing/Undressing Lower body dressing      What is the patient wearing?: Incontinence brief     Lower body assist Assist for lower body dressing: Total Assistance - Patient < 25%     Toileting Toileting    Toileting assist Assist for toileting: Dependent - Patient 0%     Transfers Chair/bed transfer  Transfers assist     Chair/bed transfer assist level: Dependent - mechanical lift     Locomotion Ambulation   Ambulation assist   Ambulation activity did not occur: Safety/medical concerns (quadriplegia, decreased balance/postural control)          Walk 10 feet activity   Assist  Walk 10 feet activity did not occur: Safety/medical concerns (quadriplegia, decreased balance/postural control)        Walk 50 feet activity   Assist Walk 50 feet with 2 turns activity did not occur: Safety/medical concerns (quadriplegia, decreased balance/postural control)  Walk 150 feet activity   Assist Walk 150 feet activity did not occur: Safety/medical concerns (quadriplegia, decreased balance/postural control)         Walk 10 feet on uneven surface  activity   Assist Walk 10 feet on uneven surfaces activity did not occur: Safety/medical concerns (quadriplegia, decreased balance/postural control)         Wheelchair     Assist Will patient use wheelchair at discharge?:  Yes Type of Wheelchair: Manual    Wheelchair assist level: Supervision/Verbal cueing Max wheelchair distance: >571ft    Wheelchair 50 feet with 2 turns activity    Assist        Assist Level: Supervision/Verbal cueing   Wheelchair 150 feet activity     Assist     Assist Level: Supervision/Verbal cueing      Medical Problem List and Plan: 1.  Quadriparesis secondary to transverse myelitis/NMO occurred 1 mo post 2nd Covid vaccine .  IV IG completed 03/20/2020 as well as received Rituxan dose 03/24/2020, repeat dose on 7/20.   Continue CIR, qdaily now  B/L PRAFO nightly  Discussed with therapies again, will hold off on tenodesis splint at present. 2.  Antithrombotics: -DVT/anticoagulation: Lovenox                Vascular study negative for DVT             -antiplatelet therapy: Aspirin 81 mg daily 3. Pain Management: Lidoderm patch, tramadol as needed  Controlled on 8/3  Monitor with increased exertion 4. Mood: Zoloft 150 mg nightly, Ativan as needed             -antipsychotic agents: N/A 5. Neuropsych: This patient is capable of making decisions on his own behalf. 6. Skin/Wound Care: Routine skin checks  Air mattress ordered  Continue to encourage pressure-relief  Ongoing hydrotherapy.  WOC following  7/31- turn q2 hours - reordered 7. Fluids/Electrolytes/Nutrition: Routine in and outs.   BMP within acceptable range on 7/26 8.  Diabetes mellitus type 2 with hyperglycemia.  Hemoglobin A1c 6.7.    On Jardiance 12.5 mg daily, Metformin 1000 mg twice daily, Lantus 30 units, NovoLog 25-30 units 3 times daily PTA  Lantus insulin 30 units daily  Metformin 500 twice daily resumed on 7/22  Jardiance 10 started on 7/16  Slightly labile on 8/3 9. HCAP/OSA.  Completed course of Maxipime initiated 03/18/2020 x14 doses.  Continue BiPAP 10. Hyperlipidemia. Lipitor/Vascepa 11. Hypertension.  Lisinopril 20 mg daily, decreased to 10 on 7/23  Controlled/soft on 8/3, will  consider further decreasing medications if persistent  Monitor with increased mobility 12.  Transaminitis  LFTs elevated, but improving on 7/16, labs ordered for tomorrow  Continue to monitor  Continue to monitor 13.  Leukopenia- medication induced  WBCs 4.7 on 7/26, labs ordered for tomorrow  Discussed with pharmacy - [facility] WNL  Continue to monitor 14.  Acute blood loss anemia  Hemoglobin 12.4 on 7/26  Continue to monitor 10.  Neurogenic bowel/bladder  Indwelling Foley due to traumatic I/O caths, DC Foley with I/O caths   Tolerating   Started Flomax 0.4 mg nightly  Bowel program:    -miralax stopped   -added probiotic 7/17   -PM suppository DC'd on 7/19   -metamucil d/ced   Continue digital stimulation   Regular BMs. 11.  E. coli UTI  Continue Bactrim for now since felt bad with fever.   LOS: 27 days A FACE TO FACE EVALUATION WAS PERFORMED  Lonni Dirden Lorie Phenix 04/21/2020,  8:37 AM

## 2020-04-21 NOTE — Progress Notes (Signed)
Patient ID: Brent [name]., male   DOB: 02/19/1946, 74 y.o.   MRN: [id number]   Sw received phone call from pt wife [name] and her friend to discuss SNF placement process. SW reiterated current bed offers, and requiring a decision to be made. Wife intends to speak with pt again.   SW met with pt in room to discuss SNF. Pt would like follow-up with [facility]. SW informed will follow-up.   Loralee Pacas, MSW, [facility] Office: [phone] Cell: [phone] Fax: [phone]

## 2020-04-21 NOTE — Consult Note (Signed)
[facility] Nurse wound follow up Patient receiving care in [facility] 4M08. Wound type: unstageable sacral wound Measurement: Please see PT notes from hydrotherapy treatment today. Slough is being removed from wound bed.   Wound bed: Drainage (amount, consistency, odor)  Periwound: Dressing procedure/placement/frequency: Continue current hydrotherapy and Santyl. Val Riles, RN, MSN, CWOCN, CNS-BC, pager [phone]

## 2020-04-21 NOTE — Progress Notes (Signed)
Occupational Therapy Session Note  Patient Details  Name: Brent [name]. MRN: [id number] Date of Birth: [birth year]  Today's Date: 04/21/2020 OT Individual Time: 9774-1423 OT Individual Time Calculation (min): 30 min    Short Term Goals: Week 4:  OT Short Term Goal 1 (Week 4): Pt will don pants with mod assist utilizing figure 4/circle sitting to increase independence OT Short Term Goal 2 (Week 4): Pt will direct lift transfer with mod cues OT Short Term Goal 3 (Week 4): Pt will complete bathing with min assist at bed level with focus on increased success with washing buttocks and BLE  Skilled Therapeutic Interventions/Progress Updates:    Treatment session with focus on self-care retraining and trunk control during UB bathing and dressing. Pt received semi-reclined in bed expressing desire to bathe and dress.  Engaged in Brent [name] bathing this session with focus on increased trunk control in unsupported sitting.  Pt with use of bed rails and max assist to come to upright sitting as needed for dressing and to allow therapist to wash his back.  Therapist placed pillow behind back to increase upright sitting to allow for increased success with UB bathing.  Utilized wash mit with pt able to wash upper body and underarms after setup.  Pt donned shirt with min assist to pull shirt down over back and mod assist to maintain upright sitting balance.  Engaged in Brent [name] and [name] to complete hygiene as pt reports feeling like it is "leaking out".  Pt with loose small BM, completed hygiene while pt maintained sidelying with CGA.  Pt remained in bed for hydrotherapy.  Therapy Documentation Precautions:  Precautions Precautions: Fall Precaution Comments: quadriplegia Restrictions Weight Bearing Restrictions: No Pain:  Pt with no c/o pain   Therapy/Group: Individual Therapy  Simonne Come 04/21/2020, 12:33 PM

## 2020-04-21 NOTE — Progress Notes (Signed)
Occupational Therapy Note  Patient Details  Name: Brent [name]. MRN: [id number] Date of Birth: 1945/11/14   Attempted to see pt for unscheduled, additional therapy session with focus on functional hand use.  RN present completing I/O cath, requested therapist return afterwards. When therapist returned, pt's wife had just arrived and reports that she will only be able to stay a short while and pt requests that therapist just return tomorrow.    Simonne Come 04/21/2020, 2:57 PM

## 2020-04-21 NOTE — Progress Notes (Signed)
Physical Therapy Session Note  Patient Details  Name: Brent [name]. MRN: [id number] Date of Birth: Sep 27, 1945  Today's Date: 04/21/2020 PT Individual Time: 6314-9702 and 1300-1326  PT Individual Time Calculation (min): 43 min and 26 min  Short Term Goals: Week 3:  PT Short Term Goal 1 (Week 3): Pt will maintain static sitting balance x 5 min with CGA PT Short Term Goal 1 - Progress (Week 3): Progressing toward goal PT Short Term Goal 2 (Week 3): Pt will begin to verbalize instruction for lift transfers PT Short Term Goal 2 - Progress (Week 3): Met PT Short Term Goal 3 (Week 3): Pt will demonstrate power tilt and recline features PT Short Term Goal 3 - Progress (Week 3): Met Week 4:  PT Short Term Goal 1 (Week 4): Pt will maintain static sitting balance x 5 min with CGA PT Short Term Goal 2 (Week 4): Pt will consistantly be able to instruct process/technique for lift transfers PT Short Term Goal 3 (Week 4): Pt will perform anterior leans in power WC with CGA  Skilled Therapeutic Interventions/Progress Updates:   Treatment Session 1: 1115-1158 43 min Received pt supine in bed, pt agreeable to therapy, and did not state pain level during session. Session with emphasis on functional mobility/transfers, toileting/dressing, generalized strengthening, dynamic sitting balance/coordination, and improved activity tolerance. Pt reported he has been "leaking" stool a lot lately (MD aware) and prior to starting session checked brief. Noted pt with soiled brief. Pt rolled to L and R with min A and use of bedrails and required total A to doff soiled brief, perform peri-care, don clean brief, and pull pants over hips. Donned shoes dependently. Pt rolled L and R with min A and use of bedrails and required +2 assist to position Maximove sling. Pt transferred bed<>power WC dependently via Maximove with +2 assist to preserve sacral wounds. Pt required +2 assist to reposition hips in WC. Gait belt donned to  bilateral knees to prevent excessive hip abduction. Pt reported issue with joystick moving too slow; therapist assisted with adjusting speed controls on joystick as pt performed power WC mobility 86f with supervision down hallway. Concluded session with pt semi-reclined in power WC, needs within reach, and seatbelt alarm and power WC seatbelt on.   Treatment Session 2: 1300-1326 26 min Received pt sitting in power WC, pt agreeable to therapy, and did not state pain level during session. Session with emphasis on functional mobility/transfers, generalized strengthening, dynamic sitting balance/coordination, and improved activity tolerance. Pt performed power mobility [id number] 2 trials to/from therapy gym with supervision and increased time. Worked on dynamic sitting balance and trunk control batting ball with 1lb dowel 1x10, 1x5, 1x8, and 1x4 with min A increasing to mod with fatigue for sitting balance. Pt with 1 LOB anteriorly dropping dowel however, pt able to brace himself with UEs on legs to prevent falling forward and pushed himself back to neutral position with CGA. Pt required multiple rest breaks throughout session and reported fatigue at end of session. Concluded session with pt semi-reclined in power WC performing pressure relief, needs within reach, and seatbelt alarm and power WC seatbelt on.  Therapy Documentation Precautions:  Precautions Precautions: Fall Precaution Comments: quadriplegia Restrictions Weight Bearing Restrictions: No   Therapy/Group: Individual Therapy AnAlfonse AlpersT, DPT   04/21/2020, 7:34 AM

## 2020-04-21 NOTE — Progress Notes (Signed)
Physical Therapy Wound Treatment Patient Details  Name: Brent [name]. MRN: [id number] Date of Birth: 03/06/1946  Today's Date: 04/21/2020 PT Individual Time: 0906-0935 PT Individual Time Calculation (min): 29 min   Subjective  Subjective: Asking how the wound is progressing Patient and Family Stated Goals: none stated Date of Onset:  (unknown) Prior Treatments: dressing change  Pain Score:   Denies pain  Wound Assessment  Pressure Injury 03/25/20 Buttocks Right;Medial Unstageable - Full thickness tissue loss in which the base of the injury is covered by slough (yellow, tan, gray, green or brown) and/or eschar (tan, brown or black) in the wound bed. (Active)  Wound Image   04/15/20 1221  Dressing Type Barrier Film (skin prep);Foam - Lift dressing to assess site every shift;Gauze (Comment);Moist to moist;Other (Comment) 04/21/20 0943  Dressing Changed 04/21/20 0943  Dressing Change Frequency Daily 04/21/20 0943  State of Healing Early/partial granulation 04/21/20 0943  Site / Wound Assessment Pink;Yellow 04/21/20 0943  % Wound base Red or Granulating 5% 04/21/20 0943  % Wound base Yellow/Fibrinous Exudate 95% 04/21/20 0943  % Wound base Black/Eschar 0% 04/20/20 0941  % Wound base Other/Granulation Tissue (Comment) 0% 04/20/20 0941  Peri-wound Assessment Intact;Pink;Purple 04/21/20 0943  Wound Length (cm) 6 cm 04/15/20 1221  Wound Width (cm) 6 cm 04/15/20 1221  Wound Depth (cm) 0.1 cm 04/15/20 1221  Wound Surface Area (cm^2) 36 cm^2 04/15/20 1221  Wound Volume (cm^3) 3.6 cm^3 04/15/20 1221  Margins Unattached edges (unapproximated) 04/21/20 0943  Drainage Amount Scant 04/21/20 0943  Drainage Description Serosanguineous 04/21/20 0943  Treatment Debridement (Selective);Hydrotherapy (Pulse lavage);Other (Comment) 04/21/20 0943      Hydrotherapy Pulsed lavage therapy - wound location: R buttocks Pulsed Lavage with Suction (psi): 12 psi Pulsed Lavage with Suction - Normal  Saline Used: 1000 mL Pulsed Lavage Tip: Tip with splash shield Selective Debridement Selective Debridement - Location: R buttocks Selective Debridement - Tools Used: Forceps;Scalpel;Scissors Selective Debridement - Tissue Removed: yellow necrotic tissue   Wound Assessment and Plan  Wound Therapy - Assess/Plan/Recommendations Wound Therapy - Clinical Statement: Dakin's treatment day#2 per orders. (No santyl applied). Top layer of yellow fibrinous eschar/slough able to be debrided, however underlying is further adherent yellow necrotic tissue. Beginning to see small areas of pink underlying eschar. Will benefit from further treatment to remove necrotic tissue and promote healing.  Wound Therapy - Functional Problem List: decreased mobility, paraplegic Factors Delaying/Impairing Wound Healing: Altered sensation;Immobility Hydrotherapy Plan: Debridement;Patient/family education;Pulsatile lavage with suction Wound Therapy - Frequency: 6X / week Wound Therapy - Follow Up Recommendations: Skilled nursing facility Wound Plan: see above  Wound Therapy Goals- Improve the function of patient's integumentary system by progressing the wound(s) through the phases of wound healing (inflammation - proliferation - remodeling) by: Decrease Necrotic Tissue to: 80 Decrease Necrotic Tissue - Progress: Progressing toward goal Increase Granulation Tissue to: 20 Increase Granulation Tissue - Progress: Progressing toward goal Goals/treatment plan/discharge plan were made with and agreed upon by patient/family: Yes Time For Goal Achievement: 7 days Wound Therapy - Potential for Goals: Fair  Goals will be updated until maximal potential achieved or discharge criteria met.  Discharge criteria: when goals achieved, discharge from hospital, MD decision/surgical intervention, no progress towards goals, refusal/missing three consecutive treatments without notification or medical reason.  GP     Arby Barrette, PT Pager  [phone]  Brent [name] 04/21/2020, 9:52 AM

## 2020-04-22 ENCOUNTER — Inpatient Hospital Stay (HOSPITAL_COMMUNITY): Payer: Medicare Other

## 2020-04-22 ENCOUNTER — Inpatient Hospital Stay (HOSPITAL_COMMUNITY): Payer: Medicare Other | Admitting: Occupational Therapy

## 2020-04-22 LAB — CBC WITH DIFFERENTIAL/PLATELET
Abs Immature Granulocytes: 0.04 10*3/uL (ref 0.00–0.07)
Basophils Absolute: 0 10*3/uL (ref 0.0–0.1)
Basophils Relative: 1 %
Eosinophils Absolute: 0.1 10*3/uL (ref 0.0–0.5)
Eosinophils Relative: 1 %
HCT: 35.9 % — ABNORMAL LOW (ref 39.0–52.0)
Hemoglobin: 11.6 g/dL — ABNORMAL LOW (ref 13.0–17.0)
Immature Granulocytes: 1 %
Lymphocytes Relative: 18 %
Lymphs Abs: 1 10*3/uL (ref 0.7–4.0)
MCH: 28.4 pg (ref 26.0–34.0)
MCHC: 32.3 g/dL (ref 30.0–36.0)
MCV: 87.8 fL (ref 80.0–100.0)
Monocytes Absolute: 0.4 10*3/uL (ref 0.1–1.0)
Monocytes Relative: 8 %
Neutro Abs: 3.7 10*3/uL (ref 1.7–7.7)
Neutrophils Relative %: 71 %
Platelets: 270 10*3/uL (ref 150–400)
RBC: 4.09 MIL/uL — ABNORMAL LOW (ref 4.22–5.81)
RDW: 13.5 % (ref 11.5–15.5)
WBC: 5.2 10*3/uL (ref 4.0–10.5)
nRBC: 0 % (ref 0.0–0.2)

## 2020-04-22 LAB — COMPREHENSIVE METABOLIC PANEL
ALT: 42 U/L (ref 0–44)
AST: 45 U/L — ABNORMAL HIGH (ref 15–41)
Albumin: 2.6 g/dL — ABNORMAL LOW (ref 3.5–5.0)
Alkaline Phosphatase: 51 U/L (ref 38–126)
Anion gap: 10 (ref 5–15)
BUN: 23 mg/dL (ref 8–23)
CO2: 22 mmol/L (ref 22–32)
Calcium: 9.3 mg/dL (ref 8.9–10.3)
Chloride: 104 mmol/L (ref 98–111)
Creatinine, Ser: 0.82 mg/dL (ref 0.61–1.24)
GFR calc Af Amer: >60 mL/min (ref >60)
GFR calc non Af Amer: >60 mL/min (ref >60)
Glucose, Bld: 128 mg/dL — ABNORMAL HIGH (ref 70–99)
Potassium: 4.3 mmol/L (ref 3.5–5.1)
Sodium: 136 mmol/L (ref 135–145)
Total Bilirubin: 0.3 mg/dL (ref 0.3–1.2)
Total Protein: 5.9 g/dL — ABNORMAL LOW (ref 6.5–8.1)

## 2020-04-22 LAB — GLUCOSE, CAPILLARY
Glucose-Capillary: 113 mg/dL — ABNORMAL HIGH (ref 70–99)
Glucose-Capillary: 126 mg/dL — ABNORMAL HIGH (ref 70–99)
Glucose-Capillary: 158 mg/dL — ABNORMAL HIGH (ref 70–99)
Glucose-Capillary: 146 mg/dL — ABNORMAL HIGH (ref 70–99)

## 2020-04-22 LAB — SARS CORONAVIRUS 2 (TAT 6-24 HRS): SARS Coronavirus 2: NEGATIVE

## 2020-04-22 MED ORDER — CEPHALEXIN 250 MG PO CAPS
500.0000 mg | ORAL_CAPSULE | Freq: Four times a day (QID) | ORAL | Status: DC
  Administered 2020-04-22 – 2020-04-24 (×9): 500 mg via ORAL
  Filled 2020-04-22 (×9): qty 2

## 2020-04-22 NOTE — Progress Notes (Signed)
This nurse assisted xray with positioning w NT(Destiny); pt voiced he felt he needed to be cleaned. Upon assessment pt had a small soft-mushy bm. This nurse informed pt during cleaning that I would dig stim, pt tolerated, voiced he felt it "helped with movement"; yet there was no stool to follow or any solid stool present to assist out other than stool of the same mushy texture. Pt has been having constant bms for the last few days with bed mobility and turning.  Nursing will continue to assess and continue with dig stem after meals, per order clarification by Posey Pronto, MD.

## 2020-04-22 NOTE — Discharge Summary (Addendum)
Physician Discharge Summary  Patient ID: Brent [name]. MRN: [id number] DOB/AGE: [birth year] 74 y.o.  Admit date: 03/25/2020 Discharge date: 04/24/2020  Discharge Diagnoses:  Principal Problem:   Quadriplegia and quadriparesis ([place]) Active Problems:   Acute blood loss anemia   Transaminitis   Controlled type 2 diabetes mellitus with hyperglycemia, with long-term current use of insulin (HCC)   Neurogenic bladder   Neurogenic bowel   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HCC)   Transverse myelitis (HCC)   Pressure injury of skin   E. coli UTI   Abdominal pain   Discharged Condition:  Stable   Significant Diagnostic Studies: DG Abd 1 View  Result Date: 04/22/2020 CLINICAL DATA:  Abdominal pain EXAM: ABDOMEN - 1 VIEW COMPARISON:  03/15/2020 FINDINGS: Scattered large and small bowel gas is noted. No free air is seen. Rounded calcification is noted in the left upper abdomen consistent with a renal cyst with milk of calcium within. No acute bony abnormality is noted. Degenerative changes of lumbar spine are seen. Mild retained fecal material is noted within the colon without definitive obstruction. IMPRESSION: Mild retained fecal material. No significant obstructive changes are seen. Electronically Signed   By: Inez Catalina M.D.   On: 04/22/2020 12:30   VAS [place] LOWER EXTREMITY VENOUS (DVT)  Result Date: 03/26/2020  Lower Venous DVTStudy Indications: Swelling.  Risk Factors: None identified. Limitations: Poor ultrasound/tissue interface. Comparison Study: No prior studies. Performing Technologist: Oliver Hum RVT  Examination Guidelines: A complete evaluation includes B-mode imaging, spectral Doppler, color Doppler, and power Doppler as needed of all accessible portions of each vessel. Bilateral testing is considered an integral part of a complete examination. Limited examinations for reoccurring indications may be performed as noted. The reflux portion of the exam is performed with the patient in reverse  Trendelenburg.  +---------+---------------+---------+-----------+----------+--------------+ RIGHT    CompressibilityPhasicitySpontaneityPropertiesThrombus Aging +---------+---------------+---------+-----------+----------+--------------+ CFV      Full           Yes      Yes                                 +---------+---------------+---------+-----------+----------+--------------+ SFJ      Full                                                        +---------+---------------+---------+-----------+----------+--------------+ FV Prox  Full                                                        +---------+---------------+---------+-----------+----------+--------------+ FV Mid   Full                                                        +---------+---------------+---------+-----------+----------+--------------+ FV DistalFull                                                        +---------+---------------+---------+-----------+----------+--------------+  PFV      Full                                                        +---------+---------------+---------+-----------+----------+--------------+ POP      Full           Yes      Yes                                 +---------+---------------+---------+-----------+----------+--------------+ PTV      Full                                                        +---------+---------------+---------+-----------+----------+--------------+ PERO     Full                                                        +---------+---------------+---------+-----------+----------+--------------+   +---------+---------------+---------+-----------+----------+--------------+ LEFT     CompressibilityPhasicitySpontaneityPropertiesThrombus Aging +---------+---------------+---------+-----------+----------+--------------+ CFV      Full           Yes      Yes                                  +---------+---------------+---------+-----------+----------+--------------+ SFJ      Full                                                        +---------+---------------+---------+-----------+----------+--------------+ FV Prox  Full                                                        +---------+---------------+---------+-----------+----------+--------------+ FV Mid   Full                                                        +---------+---------------+---------+-----------+----------+--------------+ FV DistalFull                                                        +---------+---------------+---------+-----------+----------+--------------+ PFV      Full                                                        +---------+---------------+---------+-----------+----------+--------------+   POP      Full           Yes      Yes                                 +---------+---------------+---------+-----------+----------+--------------+ PTV      Full                                                        +---------+---------------+---------+-----------+----------+--------------+ PERO     Full                                                        +---------+---------------+---------+-----------+----------+--------------+     Summary: RIGHT: - There is no evidence of deep vein thrombosis in the lower extremity.  - No cystic structure found in the popliteal fossa.  LEFT: - There is no evidence of deep vein thrombosis in the lower extremity.  - No cystic structure found in the popliteal fossa.  *See table(s) above for measurements and observations. Electronically signed by Servando Snare MD on 03/26/2020 at 8:18:20 PM.    Final     Labs:  Basic Metabolic Panel: BMP Latest Ref Rng & Units 04/22/2020 04/13/2020 04/06/2020  Glucose 70 - 99 mg/dL 128(H) 130(H) 119(H)  BUN 8 - 23 mg/dL 23 17 19   Creatinine 0.61 - 1.24 mg/dL 0.82 0.62 0.64  BUN/Creat Ratio 10 - 24 - - -   Sodium 135 - 145 mmol/L 136 137 137  Potassium 3.5 - 5.1 mmol/L 4.3 3.9 4.0  Chloride 98 - 111 mmol/L 104 103 105  CO2 22 - 32 mmol/L 22 24 25   Calcium 8.9 - 10.3 mg/dL 9.3 9.2 9.3    CBC: CBC Latest Ref Rng & Units 04/22/2020 04/13/2020 04/09/2020  WBC 4.0 - 10.5 K/uL 5.2 4.7 3.6(L)  Hemoglobin 13.0 - 17.0 g/dL 11.6(L) 12.4(L) 11.9(L)  Hematocrit 39 - 52 % 35.9(L) 39.9 37.9(L)  Platelets 150 - 400 K/uL 270 260 267    CBG: Recent Labs  Lab 04/23/20 0622 04/23/20 1212 04/23/20 1628 04/23/20 2109 04/24/20 0604  GLUCAP 108* 134* 131* 151* 140*    Brief HPI:   Nikoloz Huy. is a 74 y.o. male with history of HTN, T2DM with neuropathy, CAD, sciatica, who was originally admitted on 03/08/2020 with chest pain.  Cardiac work-up negative including nuclear stress test was unremarkable and CT chest negative for dissection.  He also reported progressive lower extremity weakness with sensory deficits as well as decreased sensation and weakness in hands.  Neurology was consulted for input and work-up done revealing longitudinally extensive transverse myelitis with positive NMO antibodies.   Cardiology was contacted due to question of spinal cord infarct as cause of weakness and 30-day event monitor recommended on outpatient basis. Work up consistent with NMO and he has been treated with 5-day course of IV steroids, 5 days of IVIG as well as IV Rituxan on 07/06.  Hospital course also complicated by acute hypoxic respiratory failure due to HCAP, AKI and constipation due to neurogenic bladder. He continues to be limited by quadriparesis affecting  ADLs and mobility.  CIR was recommended for follow-up therapy   Hospital Course: [facility]. was admitted to rehab 03/25/2020 for inpatient therapies to consist of PT and OT at least three hours five days a week. Past admission physiatrist, therapy team and rehab RN have worked together to provide customized collaborative inpatient rehab. BLE  Dopplers done were negative for DVT and he was maintained on subcu Lovenox for DVT prophylaxis--he will need to continue on two additional months of Lovenox to complete his DVT prophylaxis course.   He received additional dose of right toxin on 07/20 and is to follow-up with Dr. Felecia Shelling  in 2 to 4 weeks for input and additional treatment.  Medication induced leukopenia has resolved.  Acute blood loss anemia has been monitored and is stable.  Abnormal LFTs have been followed along and is improving. Blood pressures were monitored on TID basis and have been stable. Diabetes has been monitored with ac/hs CBG checks and SSI was use prn for tighter BS control. He was started on bowel program to manage constipation due to neurogenic bowels. He did report abdominal pain and firm nodule felt to be due to multiple injection sites and recommend continuing to treat with warm compress for symptomatic.  Laxatives have been adjusted and he currently has good results with Dulcolax suppository followed by digital stimulation after supper daily.   Urinary retention due to neurogenic bladder has been treated with in and out caths.  Foley was placed briefly due to traumatic catheterizations and he was started on Flomax on 07/31.  Foley was discontinued with repeat voiding trial however he required I/O caths 4-6 hours due to neurogenic bladder with high volumes. Foley was placed at discharge for ease of care. He was found to have E. coli UTI and was started on Septra empirically.  this was changed to Keflex due to resistance on 08/04 for 7 total day of antibiotic therapy. He developed stage III coccygeal wound as well as sacral wound. Hydrotherapy was started to help with debridement and currently has had decrease in necrotic tissue by 80% with increase in granulation tissue by 20%. Recommend air mattress for pressure relief and transition to santyl with damp to dry dressing daily. Change dressing more frequently if soiled.   He  continues have significant limitations due to quadriplegia and requires extensive care. Family unable to provide care needed and has elected on SNF for follow up therapy. He was discharged to [facility] on 04/24/20.     Rehab course: During patient's stay in rehab weekly team conferences were held to monitor patient's progress, set goals and discuss barriers to discharge. At admission, patient required total assist with ADL tasks as well as mobility. He has had improvement in activity tolerance, balance, postural control as well as ability to compensate for deficits.  He requires min assist for upper body dressing and min to max assist for LB dressing tasks. He requires lift for transfers and is able to use power wheelchair at modified independent level.     Disposition: [facility].  Diet: Diabetic diet.  Special Instructions: 1 Bowel program-. Perform digital stimulation followed by suppository after supper daily.  2. Needs to wear hand splints as well as PRAFO's at night daily. 3. Apply santyl with damp to dry dressing to sacral wound. Change dressing daily and more frequently if soiled. 4. Pressure relief measures every 20 minutes when in chair. Side lie when in bed and use pressure relief mattress.  4.  Lovenox to continue for 2 additional months for DVT prophylaxis. 5. Monitor blood sugars before meals and at bedtime--use SSI per protocol.   6. Use BIPAP when napping and at nights.  7. Foley placed on 04/24/20. Continue Keflex for 5 more days to complete 7 day course treatment of UTI.     Discharge Instructions    Ambulatory referral to Neurology   Complete by: As directed    An appointment is requested in approximately 4 weeks transverse myelitis/neuromyelitis optica   Ambulatory referral to Physical Medicine Rehab   Complete by: As directed    Follow up appointment in 4 weeks     Allergies as of 04/24/2020      Reactions   Levofloxacin Rash   Penicillins Hives,  Rash   Tolerated cefepime and cephalexin      Medication List    STOP taking these medications   benzonatate 100 MG capsule Commonly known as: TESSALON   docusate sodium 100 MG capsule Commonly known as: COLACE   ibuprofen 200 MG tablet Commonly known as: ADVIL   metFORMIN 1000 MG tablet Commonly known as: GLUCOPHAGE   NovoLOG FlexPen 100 UNIT/ML FlexPen Generic drug: insulin aspart   polyethylene glycol 17 g packet Commonly known as: MIRALAX / GLYCOLAX   triamcinolone ointment 0.1 % Commonly known as: KENALOG   zolpidem 10 MG tablet Commonly known as: AMBIEN     TAKE these medications   acetaminophen 325 MG tablet Commonly known as: TYLENOL Take 2 tablets (650 mg total) by mouth every 6 (six) hours as needed for headache.   albuterol (2.5 MG/3ML) 0.083% nebulizer solution Commonly known as: PROVENTIL Take 3 mLs (2.5 mg total) by nebulization once as needed for wheezing or shortness of breath (or anaphylaxis due to Rituxan infusion).   aspirin EC 81 MG tablet Take 81 mg by mouth at bedtime. Swallow whole.   atorvastatin 20 MG tablet Commonly known as: LIPITOR Take 1 tablet (20 mg total) by mouth daily.   cephALEXin 500 MG capsule Commonly known as: KEFLEX Take 1 capsule (500 mg total) by mouth every 6 (six) hours.   collagenase ointment Commonly known as: SANTYL Cleanse sacral area and then apply a layer of santyl with damp to dry dressing. Change daily and prn if soiled   empagliflozin 10 MG Tabs tablet Commonly known as: JARDIANCE Take 1 tablet (10 mg total) by mouth daily. Start taking on: April 25, 2020 What changed:   medication strength  how much to take   fenofibrate 160 MG tablet Take 160 mg by mouth at bedtime.   fluticasone 50 MCG/ACT nasal spray Commonly known as: FLONASE Place 2 sprays into both nostrils 2 (two) times daily. What changed: See the new instructions.   insulin glargine 100 unit/mL Sopn Commonly known as:  LANTUS Inject 0.3 mLs (30 Units total) into the skin at bedtime.   lidocaine 5 % Commonly known as: LIDODERM Place 1 patch onto the skin daily. Apply to right hip at 8 am and remove at 8 pm daily. What changed: additional instructions   lisinopril 5 MG tablet Commonly known as: ZESTRIL Take 1 tablet (5 mg total) by mouth daily. Start taking on: April 25, 2020 What changed:   medication strength  how much to take   LORazepam 1 MG tablet Commonly known as: ATIVAN Take 1 tablet (1 mg total) by mouth at bedtime as needed for up to 5 days for sleep. What changed: See the new instructions.   multivitamin with minerals  Tabs tablet Take 1 tablet by mouth daily. Centrum Silver Men's   nystatin powder Commonly known as: MYCOSTATIN/NYSTOP Apply 1 application topically 3 (three) times daily.   oxybutynin 5 MG tablet Commonly known as: DITROPAN Take 5 mg by mouth 2 (two) times daily.   pantoprazole 40 MG tablet Commonly known as: PROTONIX Take 1 tablet (40 mg total) by mouth daily.   saccharomyces boulardii 250 MG capsule Commonly known as: FLORASTOR Take 1 capsule (250 mg total) by mouth 2 (two) times daily.   sertraline 100 MG tablet Commonly known as: ZOLOFT Take 150 mg by mouth at bedtime.   tamsulosin 0.4 MG Caps capsule Commonly known as: FLOMAX Take 1 capsule (0.4 mg total) by mouth daily after supper.   traMADol 50 MG tablet Commonly known as: ULTRAM Take 1 tablet (50 mg total) by mouth every 8 (eight) hours as needed for severe pain.   Vascepa 1 g capsule Generic drug: icosapent Ethyl Take 2 capsules (2 g total) by mouth 2 (two) times daily.   vitamin B-12 500 MCG tablet Commonly known as: CYANOCOBALAMIN Take 500 mcg by mouth daily.       Follow-up Information    Jamse Arn, MD Follow up.   Specialty: Physical Medicine and Rehabilitation Why: Office to call for appointment Contact information: [place]  [address] [phone]        Burnell Blanks, MD Follow up.   Specialty: Cardiology Why: Call for appointment Contact information: [place]. [address] [place] [address] [phone]               Signed: Bary Leriche 04/24/2020, 11:40 AM Patient was seen, face-face, and physical exam performed by me on day of discharge, greater than 30 minutes of total time spent.. Please see progress note from day of discharge as well.  Delice Lesch, MD, ABPMR

## 2020-04-22 NOTE — Patient Care Conference (Addendum)
Inpatient RehabilitationTeam Conference and Plan of Care Update Date: 04/22/2020   Time:  11:33 AM   Patient Name: Brent [name].      Medical Record Number: [id number]  Date of Birth: 1946/04/24 Sex: Male         Room/Bed: 4M08C/4M08C-01 Payor Info: Payor: MEDICARE / Plan: MEDICARE PART A AND B / Product Type: *No Product type* /    Admit Date/Time:  03/25/2020  2:57 PM  Primary Diagnosis:  Quadriplegia and quadriparesis [facility])  Hospital Problems: Principal Problem:   Quadriplegia and quadriparesis ([place]) Active Problems:   Acute blood loss anemia   Leukopenia   Transaminitis   Controlled type 2 diabetes mellitus with hyperglycemia, with long-term current use of insulin (HCC)   Neurogenic bladder   Neurogenic bowel   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[place])   Transverse myelitis (HCC)   Labile blood pressure   Labile blood glucose   Pressure injury of skin   E. coli UTI    Expected Discharge Date: Expected Discharge Date:  (SNF pending ? 04/23/20 discharge)  AM Team Members Present: Physician leading conference: Dr. Delice Lesch Care Coodinator Present: Dorien Chihuahua, RN, BSN, CRRN;Christina Sampson Goon, BSW Nurse Present: Other (comment) Royston Cowper, LPN) PT Present: Becky Sax, PT OT Present: Simonne Come, OT PPS Coordinator present : Ileana Ladd, Burna Mortimer, SLP     Current Status/Progress Goal Weekly Team Focus  Bowel/Bladder   I/O cath q4 hrs for bladder scan > 389ml. Incont of bowel. LBM 8/3  train bowel/bladder  assess q shift/prn. empty bowel/bladder per orders   Swallow/Nutrition/ Hydration             ADL's   min-mod assist sitting balance, min assist bathing with LB bed level and UB at bed/sink, min assist UB dressing, mod-max assist LB dressing.  Using maximove and educating on pressure relief due to wound on buttocks  ModA dynamic sitting, toileting, LB dsg; MinA grooming, bathing; set up eating/UB dress, functional use of BUE  ADL retraining, static and  dynamic sitting balance, trunk control, functional use of BUE, directing care   Mobility   min A rolling, max A +2 supine<>sit and sit<>supine, bed<>power WC +2 assist (via Maximove due to sacral wounds), power WC mobility 523ft supervision  max A transfers, CGA sitting balance, Mod I power WC mobility  functional mobility/transfers, dynamic sitting balance/coordination, trunk control, endurance, discharge planning   Communication             Safety/Cognition/ Behavioral Observations            Pain   c/o back pain; tramadol prn  pain < 3  assess q shift/prn   Skin   MASD to groin, unstageable to buttocks (moist to dry, santyl)  prevent furthur break down and heal current wounds   assess q shift/prn. wound care per orders     Discharge Planning:  D/c to SNF. SNF pending bed offer.   Team Discussion: Reported abd sore with limited flexion today; impaired ability to participate in therapy/sitting upright. Reviewed bowel program and effectiveness reviewed. Continue to note poor endurance limiting sessions Patient on target to meet rehab goals: yes, Maximal assistance goals set  *See Care Plan and progress notes for long and short-term goals.   Revisions to Treatment Plan:  Education on management of power wheelchair Teaching Needs: Assistance needed for care and transfers; positioning that will increase patient participation in ADLs   Current Barriers to Discharge: Decreased caregiver support, Home enviroment access/layout and Neurogenic bowel  and bladder  Possible Resolutions to Barriers: Pursuing SNF placement     Medical Summary Current Status: Quadriparesis secondary to transverse myelitis/NMO occurred 1 mo post 2nd Covid vaccine .  IV IG completed 03/20/2020 as well as received Rituxan dose 03/24/2020, repeat dose on 7/20.  Barriers to Discharge: Medical stability;Decreased family/caregiver support   Possible Resolutions to [employer] Focus: Therapies, follow labs,  optimize DM/BP meds, bowel/bladder program, abx for UTI, pending SNF   Continued Need for Acute Rehabilitation Level of Care: The patient requires daily medical management by a physician with specialized training in physical medicine and rehabilitation for the following reasons: Direction of a multidisciplinary physical rehabilitation program to maximize functional independence : Yes Medical management of patient stability for increased activity during participation in an intensive rehabilitation regime.: Yes Analysis of laboratory values and/or radiology reports with any subsequent need for medication adjustment and/or medical intervention. : Yes   I attest that I was present, lead the team conference, and concur with the assessment and plan of the team.   Dorien Chihuahua B 04/22/2020, 2:46 PM

## 2020-04-22 NOTE — Progress Notes (Signed)
Orthopedic Tech Progress Note Patient Details:  Brent [name] [birth year] [id number] Called in brace Patient ID: Brent Gip., male   DOB: April 27, 1946, 74 y.o.   MRN: [id number]   Brent [name] 04/22/2020, 2:14 PM

## 2020-04-22 NOTE — Progress Notes (Addendum)
Patient ID: Brent [name]., male   DOB: 1945/10/29, 74 y.o.   MRN: [id number]   SW left message for [name]/Admissions with [facility] [phone]) with regard to referral. SW waiting on follow-up.  *Reports she will follow-up after her morning meeting, on when there will be an available bed. Possible bed offer tomorrow.  SW spoke with Soy/Admissions with [name] to follow-up about bed offer. Reports there are no longer any beds available.   SW left message for [name]/Admissions with [facility] [phone]).  *Reports they are reviewing, and will have an answer in the morning.   SW left message for [name]/liasion with [place] [facility] about referral. Waiting on follow-up. *no beds available.   Waiting on follow-up from SNF: SW left message for [name]/Admissions with [facility] [phone]).   SW left message for [name]/Admissions with [facility] [phone]) to follow-up about referral.   SW left message for [name]/[facility] [phone]) about referral.   SW left message for [name]/Admissions with [facility] [phone]) to discuss referral.   Loralee Pacas, MSW, [place] Office: [phone] Cell: [phone] Fax: [phone]

## 2020-04-22 NOTE — Progress Notes (Signed)
Occupational Therapy Session Note  Patient Details  Name: Brent [name]. MRN: [id number] Date of Birth: 05-31-1946  Today's Date: 04/22/2020 OT Individual Time: 4497-5300 OT Individual Time Calculation (min): 63 min    Short Term Goals: Week 4:  OT Short Term Goal 1 (Week 4): Pt will don pants with mod assist utilizing figure 4/circle sitting to increase independence OT Short Term Goal 2 (Week 4): Pt will direct lift transfer with mod cues OT Short Term Goal 3 (Week 4): Pt will complete bathing with min assist at bed level with focus on increased success with washing buttocks and BLE  Skilled Therapeutic Interventions/Progress Updates:    Treatment session with focus on self-care retraining with bathing and LB dressing at bed level.  Pt received semi-reclined in bed with RN present administering meds.  Therapist set up all items to engage in bathing at bed level.  Applied wash mitt to RUE to allow pt to engage in bathing.  Upon adjusting bed to increase sitting balance to allow for increased participation in washing lower legs, pt reports pain and tightness in abdomen.  Pt unable to tolerate sitting >70* upright therefore requiring increased assistance with washing lower legs.  Therapist assisted pt with obtaining figure 4 position with pt able to wash lower legs but unable to reach feet due to decreased upright posture.  Notified RN and MD of increased pain/tightness in abdomen and impacting participation in self-care tasks.  Pt required assistance to thread pant legs over feet this session again due to decreased ability to reach feet in decreased upright sitting posture.  Pt also reports decreased functional use of hands this session, stating he feels that he can't even hold items as he has been - notified MD during conference.  Min assist rolling Rt and Lt for LB dressing with therapist adjusting pants over hips due to decreased functional use of hands.  Transferred OOB with use of maximove  with +2 for safety and positioned upright in power w/c.  Applied gait belt to thighs for improved positioning in w/c.  Pt completed UB dressing upright in w/c with min assist to pull shirt over trunk both when doffing dirty shirt and when donning clean shirt.  Pt setup to complete oral care while at sink.  Pt able to utilize built up handle on toothbrush with gross grasp.  Pt remained upright in power w/c with all needs in reach.  Therapy Documentation Precautions:  Precautions Precautions: Fall Precaution Comments: quadriplegia Restrictions Weight Bearing Restrictions: No Pain: Pain Assessment Pain Scale: 0-10 Pain Score: 0-No pain   Therapy/Group: Individual Therapy  Simonne Come 04/22/2020, 12:29 PM

## 2020-04-22 NOTE — Progress Notes (Signed)
Physical Therapy Wound Treatment Patient Details  Name: Brent [name]. MRN: [id number] Date of Birth: [birth year]  Today's Date: 04/22/2020 Time:  -     Subjective  Subjective: "Is it still coming off?" Patient and Family Stated Goals: none stated Date of Onset:  (unknown) Prior Treatments: dressing change  Pain Score:  0/10  Wound Assessment  Pressure Injury 03/25/20 Buttocks Right;Medial Unstageable - Full thickness tissue loss in which the base of the injury is covered by slough (yellow, tan, gray, green or brown) and/or eschar (tan, brown or black) in the wound bed. (Active)  Wound Image   04/22/20 1000  Dressing Type Barrier Film (skin prep);Gauze (Comment);Moist to moist;Other (Comment) 04/22/20 1000  Dressing Changed;Clean;Dry;Intact 04/22/20 1000  Dressing Change Frequency Daily 04/22/20 1000  State of Healing Early/partial granulation 04/22/20 1000  Site / Wound Assessment Pink;Yellow 04/22/20 1000  % Wound base Red or Granulating 5% 04/22/20 1000  % Wound base Yellow/Fibrinous Exudate 95% 04/22/20 1000  % Wound base Black/Eschar 0% 04/22/20 1000  % Wound base Other/Granulation Tissue (Comment) 0% 04/22/20 1000  Peri-wound Assessment Intact;Erythema (blanchable);Pink;Purple 04/22/20 1000  Wound Length (cm) 7.5 cm 04/22/20 1000  Wound Width (cm) 5.5 cm 04/22/20 1000  Wound Depth (cm) 0.1 cm 04/22/20 1000  Wound Surface Area (cm^2) 41.25 cm^2 04/22/20 1000  Wound Volume (cm^3) 4.12 cm^3 04/22/20 1000  Margins Unattached edges (unapproximated) 04/22/20 1000  Drainage Amount Scant 04/22/20 1000  Drainage Description Serosanguineous;Green 04/22/20 1000  Treatment Debridement (Selective);Hydrotherapy (Pulse lavage);Other (Comment) 04/22/20 1000      Hydrotherapy Pulsed lavage therapy - wound location: R buttocks Pulsed Lavage with Suction (psi): 12 psi Pulsed Lavage with Suction - Normal Saline Used: 1000 mL Pulsed Lavage Tip: Tip with splash shield Selective  Debridement Selective Debridement - Location: R buttocks Selective Debridement - Tools Used: Forceps;Scalpel Selective Debridement - Tissue Removed: yellow necrotic tissue   Wound Assessment and Plan  Wound Therapy - Assess/Plan/Recommendations Wound Therapy - Clinical Statement: Dakin's treatment day #3 applied; yellow necrotic tissue remaining adherent and bleeding easily at attempts at sharp debridement. Will benefit from further treatment to remove necrotic tissue and promote healing.  Wound Therapy - Functional Problem List: decreased mobility, paraplegic Factors Delaying/Impairing Wound Healing: Altered sensation;Immobility Hydrotherapy Plan: Debridement;Patient/family education;Pulsatile lavage with suction Wound Therapy - Frequency: 6X / week Wound Therapy - Follow Up Recommendations: Skilled nursing facility Wound Plan: see above  Wound Therapy Goals- Improve the function of patient's integumentary system by progressing the wound(s) through the phases of wound healing (inflammation - proliferation - remodeling) by: Decrease Necrotic Tissue to: 80 Decrease Necrotic Tissue - Progress: Progressing toward goal Increase Granulation Tissue to: 20 Increase Granulation Tissue - Progress: Progressing toward goal Goals/treatment plan/discharge plan were made with and agreed upon by patient/family: Yes Time For Goal Achievement: 7 days Wound Therapy - Potential for Goals: Fair  Goals will be updated until maximal potential achieved or discharge criteria met.  Discharge criteria: when goals achieved, discharge from hospital, MD decision/surgical intervention, no progress towards goals, refusal/missing three consecutive treatments without notification or medical reason.  GP       Wyona Almas, PT, DPT Acute Rehabilitation Services Pager [phone] Office [phone]   [name] 04/22/2020, 10:06 AM

## 2020-04-22 NOTE — Progress Notes (Signed)
dig stim was performed this evening at 1950 which this nurse was able to retrieve a rather large amount of stool. Pt also reports belly tightness feeling much better afterwards. This was reported to Posey Pronto, MD and night shift RN was informed to perform dig stim at next in & out catheterization if possible, to retrieve remaining stool. Pt was cleaned, and repositioned in bed with splints set aside for night shift nurse and NT to assist with placement once patient is ready for bed. This nurse informed night shift nurse of OT's instructions when wearing the splints.

## 2020-04-22 NOTE — Progress Notes (Signed)
Physical Therapy Session Note  Patient Details  Name: Brent [name]. MRN: [id number] Date of Birth: 01/12/1946  Today's Date: 04/22/2020 PT Individual Time: 7741-2878 PT Individual Time Calculation (min): 27 min   Short Term Goals: Week 3:  PT Short Term Goal 1 (Week 3): Pt will maintain static sitting balance x 5 min with CGA PT Short Term Goal 1 - Progress (Week 3): Progressing toward goal PT Short Term Goal 2 (Week 3): Pt will begin to verbalize instruction for lift transfers PT Short Term Goal 2 - Progress (Week 3): Met PT Short Term Goal 3 (Week 3): Pt will demonstrate power tilt and recline features PT Short Term Goal 3 - Progress (Week 3): Met Week 4:  PT Short Term Goal 1 (Week 4): Pt will maintain static sitting balance x 5 min with CGA PT Short Term Goal 2 (Week 4): Pt will consistantly be able to instruct process/technique for lift transfers PT Short Term Goal 3 (Week 4): Pt will perform anterior leans in power WC with CGA  Skilled Therapeutic Interventions/Progress Updates:   Received pt sitting in power WC in dayroom with OT, pt agreeable to therapy, and did not state pain level. Session with emphasis on power WC mobility, UE strengthening, dynamic sitting balance/coordination, trunk control, and improved activity tolerance. Pt performed power WC mobility 171f x 2 trials to/from therapy gym with supervision. Worked on dynamic sitting balance, trunk control, and UE/grip strength on UBE at level 0 for 2 minutes with bilateral ace wraps donned to UEs for improved grip. Pt able to maintain dynamic sitting balance with close supervision/CGA but limited due to fatigue and poor endurance. Pt encouraged to continue activity but politely declined due to fatigue. Concluded session with pt semi-reclined in power WC, needs within reach, and seatbelt alarm and power WC seatbelt on.   Therapy Documentation Precautions:  Precautions Precautions: Fall Precaution Comments:  quadriplegia Restrictions Weight Bearing Restrictions: No  Therapy/Group: Individual Therapy AAlfonse AlpersPT, DPT   04/22/2020, 7:33 AM

## 2020-04-22 NOTE — Progress Notes (Signed)
Occupational Therapy Session Note  Patient Details  Name: Brent [name]. MRN: [id number] Date of Birth: October 22, 1945  Today's Date: 04/22/2020 OT Individual Time: 8250-5397 OT Individual Time Calculation (min): 31 min    Short Term Goals: Week 4:  OT Short Term Goal 1 (Week 4): Pt will don pants with mod assist utilizing figure 4/circle sitting to increase independence OT Short Term Goal 2 (Week 4): Pt will direct lift transfer with mod cues OT Short Term Goal 3 (Week 4): Pt will complete bathing with min assist at bed level with focus on increased success with washing buttocks and BLE  Skilled Therapeutic Interventions/Progress Updates:    Treatment session with focus on functional use of BUE.  Pt received upright in power w/c agreeable to therapy session.  Pt had previously reported feeling decreased functional use of BUE, especially RUE.  Engaged in table top activity with focus on targeting gross grasp and tenodesis grasp.  Pt demonstrating decreased closure of Rt hand with increased difficulty in picking up items from table top surface.  Utilized large foam blocks with pt able to pick up utilizing thumb and 4th finger to grasp between as unable to use gross grasp.  Noted hand to be more flat and concerned about palmar integrity.  Engaged in [place] with focus on increased grasp requiring hand over hand to facilitate hand closure.  Discussed acewrap/coban for forced closure during therapy sessions.  Requested resting hand splint from PA to maintain palmar integrity and decrease onset of flat hand to maintain functional grasp.  Pt passed off to PT for scheduled session.  Therapy Documentation Precautions:  Precautions Precautions: Fall Precaution Comments: quadriplegia Restrictions Weight Bearing Restrictions: No General:   Vital Signs: Therapy Vitals Temp: 98 F (36.7 C) Temp Source: Oral Pulse Rate: (!) 55 Resp: 17 BP: (!) 109/45 Patient Position (if appropriate):  Sitting Oxygen Therapy SpO2: 99 % O2 Device: Room Air Pain:  Pt with no c/o pain   Therapy/Group: Individual Therapy  Simonne Come 04/22/2020, 3:12 PM

## 2020-04-22 NOTE — Progress Notes (Addendum)
PHYSICAL MEDICINE & REHABILITATION PROGRESS NOTE  Subjective/Complaints: Patient seen sitting up in bed this morning.  He states he slept on/off overnight.  ROS: Denies CP, SOB, N/V/D  Objective: Vital Signs: Blood pressure (!) 110/55, pulse 60, temperature 98.3 F (36.8 C), temperature source Oral, resp. rate 16, height 6' (1.829 m), weight 108.4 kg, SpO2 97 %. No results found. Recent Labs    04/22/20 0551  WBC 5.2  HGB 11.6*  HCT 35.9*  PLT 270   Recent Labs    04/22/20 0551  NA 136  K 4.3  CL 104  CO2 22  GLUCOSE 128*  BUN 23  CREATININE 0.82  CALCIUM 9.3    Physical Exam: BP (!) 110/55 (BP Location: Left Arm)   Pulse 60   Temp 98.3 F (36.8 C) (Oral)   Resp 16   Ht 6' (1.829 m)   Wt 108.4 kg   SpO2 97%   BMI 32.41 kg/m  Constitutional: No distress . Vital signs reviewed. HENT: Normocephalic.  Atraumatic. Eyes: EOMI. No discharge. Cardiovascular: No JVD. Respiratory: Normal effort.  No stridor. GI: Non-distended. Skin: Sacral ulcer not examined today.  Warm and dry. Psych: Normal mood.  Normal behavior. Musc: No edema in extremities.  No tenderness in extremities. Neuro: Alert Motor:  RUE: Shoulder abduction, elbow flexion 5/5, wrist extension 4+/5, elbow ext 4+/5, distally 0/5, unchanged LUE: Shoulder abduction, elbow flexion 5/5, wrist extension 4-4+/5, elbow ext 4+/5, hand grip 0/5, unchanged B/l LE: 0/5 proxima to distal, stable  Assessment/Plan: 1. Functional deficits secondary to NMO/transverse myelitis which require 3+ hours per day of interdisciplinary therapy in a comprehensive inpatient rehab setting.  Physiatrist is providing close team supervision and 24 hour management of active medical problems listed below.  Physiatrist and rehab team continue to assess barriers to discharge/monitor patient progress toward functional and medical goals  Care Tool:  Bathing    Body parts bathed by patient: Right arm, Left arm, Chest,  Abdomen, Front perineal area, Right upper leg, Left upper leg, Right lower leg, Left lower leg, Face   Body parts bathed by helper: Buttocks     Bathing assist Assist Level: Moderate Assistance - Patient 50 - 74%     Upper Body Dressing/Undressing Upper body dressing Upper body dressing/undressing activity did not occur (including orthotics): N/A What is the patient wearing?: Pull over shirt    Upper body assist Assist Level: Minimal Assistance - Patient > 75%    Lower Body Dressing/Undressing Lower body dressing      What is the patient wearing?: Incontinence brief     Lower body assist Assist for lower body dressing: Total Assistance - Patient < 25%     Toileting Toileting    Toileting assist Assist for toileting: Dependent - Patient 0%     Transfers Chair/bed transfer  Transfers assist     Chair/bed transfer assist level: Dependent - mechanical lift     Locomotion Ambulation   Ambulation assist   Ambulation activity did not occur: Safety/medical concerns (quadriplegia, decreased balance/postural control)          Walk 10 feet activity   Assist  Walk 10 feet activity did not occur: Safety/medical concerns (quadriplegia, decreased balance/postural control)        Walk 50 feet activity   Assist Walk 50 feet with 2 turns activity did not occur: Safety/medical concerns (quadriplegia, decreased balance/postural control)         Walk 150 feet activity   Assist Walk 150 feet  activity did not occur: Safety/medical concerns (quadriplegia, decreased balance/postural control)         Walk 10 feet on uneven surface  activity   Assist Walk 10 feet on uneven surfaces activity did not occur: Safety/medical concerns (quadriplegia, decreased balance/postural control)         Wheelchair     Assist Will patient use wheelchair at discharge?: Yes Type of Wheelchair: Manual    Wheelchair assist level: Supervision/Verbal cueing Max  wheelchair distance: >572ft    Wheelchair 50 feet with 2 turns activity    Assist        Assist Level: Supervision/Verbal cueing   Wheelchair 150 feet activity     Assist     Assist Level: Supervision/Verbal cueing      Medical Problem List and Plan: 1.  Quadriparesis secondary to transverse myelitis/NMO occurred 1 mo post 2nd Covid vaccine .  IV IG completed 03/20/2020 as well as received Rituxan dose 03/24/2020, repeat dose on 7/20.   Continue CIR, qdaily now  B/L PRAFO nightly  Discussed with therapies again, will hold off on tenodesis splint at present.  Team conference today to discuss current and goals and coordination of care, home and environmental barriers, and discharge planning with nursing, case manager, and therapies. Please see conference note from today as well.  2.  Antithrombotics: -DVT/anticoagulation: Lovenox                Vascular study negative for DVT             -antiplatelet therapy: Aspirin 81 mg daily 3. Pain Management: Lidoderm patch, tramadol as needed  Controlled on 8/4  Monitor with increased exertion 4. Mood: Zoloft 150 mg nightly, Ativan as needed             -antipsychotic agents: N/A 5. Neuropsych: This patient is capable of making decisions on his own behalf. 6. Skin/Wound Care: Routine skin checks  Air mattress ordered  Continue to encourage pressure-relief  Ongoing hydrotherapy.  WOC following  7/31- turn q2 hours - reordered 7. Fluids/Electrolytes/Nutrition: Routine in and outs.   BMP within acceptable range on 7/26 8.  Diabetes mellitus type 2 with hyperglycemia.  Hemoglobin A1c 6.7.    On Jardiance 12.5 mg daily, Metformin 1000 mg twice daily, Lantus 30 units, NovoLog 25-30 units 3 times daily PTA  Lantus insulin 30 units daily  Metformin 500 twice daily resumed on 7/22  Jardiance 10 started on 7/16  Slightly elevated on 8/4 9. HCAP/OSA.  Completed course of Maxipime initiated 03/18/2020 x14 doses.  Continue BiPAP 10.  Hyperlipidemia. Lipitor/Vascepa 11. Hypertension.  Lisinopril 20 mg daily, decreased to 10 on 7/23  Relatively controlled on 8/4  Monitor with increased mobility 12.  Transaminitis  LFTs elevated, but improving on 8/4  Continue to monitor  Continue to monitor 13.  Leukopenia- medication induced, resolved  WBCs 5.2 on 8/4  Discussed with pharmacy - [place] WNL  Continue to monitor 14.  Acute blood loss anemia  Hemoglobin 11.6 on 8/4  Continue to monitor 10.  Neurogenic bowel/bladder  Indwelling Foley due to traumatic I/O caths, DC Foley with I/O caths   Tolerating   Started Flomax 0.4 mg nightly  Bowel program:    -miralax stopped   -added probiotic 7/17   -PM suppository DC'd on 7/19   -metamucil d/ced   Continue digital stimulation   Regular BMs. 11.  E. coli UTI  Bactrim changed to Keflex on 8/4, discussed with pharmacy  LOS: 28 days A  FACE TO FACE EVALUATION WAS PERFORMED  Brent [name] Lorie Phenix 04/22/2020, 9:09 AM

## 2020-04-23 ENCOUNTER — Inpatient Hospital Stay (HOSPITAL_COMMUNITY): Payer: Medicare Other

## 2020-04-23 ENCOUNTER — Inpatient Hospital Stay (HOSPITAL_COMMUNITY): Payer: Medicare Other | Admitting: *Deleted

## 2020-04-23 DIAGNOSIS — R1084 Generalized abdominal pain: Secondary | ICD-10-CM

## 2020-04-23 DIAGNOSIS — R109 Unspecified abdominal pain: Secondary | ICD-10-CM

## 2020-04-23 LAB — GLUCOSE, CAPILLARY
Glucose-Capillary: 108 mg/dL — ABNORMAL HIGH (ref 70–99)
Glucose-Capillary: 134 mg/dL — ABNORMAL HIGH (ref 70–99)
Glucose-Capillary: 131 mg/dL — ABNORMAL HIGH (ref 70–99)
Glucose-Capillary: 151 mg/dL — ABNORMAL HIGH (ref 70–99)

## 2020-04-23 MED ORDER — SILVER NITRATE-POT NITRATE 75-25 % EX MISC
1.0000 | Freq: Every day | CUTANEOUS | Status: DC | PRN
  Filled 2020-04-23: qty 1

## 2020-04-23 MED ORDER — LISINOPRIL 5 MG PO TABS
5.0000 mg | ORAL_TABLET | Freq: Every day | ORAL | Status: DC
  Administered 2020-04-23 – 2020-04-24 (×2): 5 mg via ORAL
  Filled 2020-04-23: qty 1

## 2020-04-23 NOTE — Progress Notes (Signed)
Physical Therapy Session Note  Patient Details  Name: Brent [name]. MRN: [id number] Date of Birth: [birth year]  Today's Date: 04/23/2020 PT Individual Time: 1130-1157 PT Individual Time Calculation (min): 27 min   Short Term Goals: Week 3:  PT Short Term Goal 1 (Week 3): Pt will maintain static sitting balance x 5 min with CGA PT Short Term Goal 1 - Progress (Week 3): Progressing toward goal PT Short Term Goal 2 (Week 3): Pt will begin to verbalize instruction for lift transfers PT Short Term Goal 2 - Progress (Week 3): Met PT Short Term Goal 3 (Week 3): Pt will demonstrate power tilt and recline features PT Short Term Goal 3 - Progress (Week 3): Met Week 4:  PT Short Term Goal 1 (Week 4): Pt will maintain static sitting balance x 5 min with CGA PT Short Term Goal 2 (Week 4): Pt will consistantly be able to instruct process/technique for lift transfers PT Short Term Goal 3 (Week 4): Pt will perform anterior leans in power WC with CGA  Skilled Therapeutic Interventions/Progress Updates:    Received pt supine in bed, pt agreeable to therapy, and did not state pain level during session but reported continued discomfort in abdomen and reported feeling "tight" and "bloated" but pt eager to get OOB. Session with emphasis on dressing, functional mobility/transfers, generalized strengthening, dynamic sitting balance/coordination, and improved activity tolerance. Donned ted hose and shoes dependently and pt rolled to L and R with min A and use of bedrails and donned pants dependently and required +2 assist to position Maximove sling. CSW present stating there is an open bed at [facility] and the plan is for the pt to discharge tomorrow. Pt transferred bed<>power WC dependently via Maximove with +2 assist to preserve sacral wounds. Pt able to perform anterior lean with min A for therapist to doff sling total A. Pt required +2 assist to reposition hips in power chair. Concluded session with pt  semi-reclined in power WC, needs within reach, and seatbelt alarm and power WC seatbelt on.   Therapy Documentation Precautions:  Precautions Precautions: Fall Precaution Comments: quadriplegia Restrictions Weight Bearing Restrictions: No   Therapy/Group: Individual Therapy Alfonse Alpers PT, DPT   04/23/2020, 7:47 AM

## 2020-04-23 NOTE — Progress Notes (Signed)
Physical Therapy Wound Treatment Patient Details  Name: Brent [name]. MRN: [id number] Date of Birth: 03/17/1946  Today's Date: 04/23/2020 PT Individual Time: 0923-1001 PT Individual Time Calculation (min): 38 min   Subjective  Subjective: Pt agreeable to hydrotherapy, pending d/c to SNF once bed available Patient and Family Stated Goals: none stated Date of Onset:  (unknown) Prior Treatments: dressing change  Pain Score:  0/10  Wound Assessment  Pressure Injury 03/25/20 Buttocks Right;Medial Unstageable - Full thickness tissue loss in which the base of the injury is covered by slough (yellow, tan, gray, green or brown) and/or eschar (tan, brown or black) in the wound bed. (Active)  Dressing Type Barrier Film (skin prep);Gauze (Comment);Foam - Lift dressing to assess site every shift;Moist to moist 04/23/20 1015  Dressing Changed;Clean;Dry;Intact 04/23/20 1015  Dressing Change Frequency Daily 04/23/20 1015  State of Healing Early/partial granulation 04/23/20 1015  Site / Wound Assessment Pink;Yellow 04/23/20 1015  % Wound base Red or Granulating 5% 04/23/20 1015  % Wound base Yellow/Fibrinous Exudate 95% 04/23/20 1015  % Wound base Black/Eschar 0% 04/23/20 1015  % Wound base Other/Granulation Tissue (Comment) 0% 04/23/20 1015  Peri-wound Assessment Intact;Erythema (blanchable);Pink 04/23/20 1015  Wound Length (cm) 7.5 cm 04/22/20 1000  Wound Width (cm) 5.5 cm 04/22/20 1000  Wound Depth (cm) 0.1 cm 04/22/20 1000  Wound Surface Area (cm^2) 41.25 cm^2 04/22/20 1000  Wound Volume (cm^3) 4.12 cm^3 04/22/20 1000  Margins Unattached edges (unapproximated) 04/23/20 1015  Drainage Amount Moderate 04/23/20 1015  Drainage Description Serosanguineous;Green 04/23/20 1015  Treatment Debridement (Selective);Hydrotherapy (Pulse lavage);Packing (Saline gauze) 04/23/20 1015      Hydrotherapy Pulsed lavage therapy - wound location: sacrum Pulsed Lavage with Suction (psi): 12 psi Pulsed  Lavage with Suction - Normal Saline Used: 1000 mL Pulsed Lavage Tip: Tip with splash shield Selective Debridement Selective Debridement - Location: Sacrum Selective Debridement - Tools Used: Forceps;Scalpel Selective Debridement - Tissue Removed: yellow necrotic tissue   Wound Assessment and Plan  Wound Therapy - Assess/Plan/Recommendations Wound Therapy - Clinical Statement: Dakin's complete but pt continues with blue/green drainage. Very thin layer of yellow slough, bleeding easily with attempts at debridement. Will benefit from further treatment to remove necrotic tissue and promote healing.  Wound Therapy - Functional Problem List: decreased mobility, paraplegic Factors Delaying/Impairing Wound Healing: Altered sensation;Immobility Hydrotherapy Plan: Debridement;Patient/family education;Pulsatile lavage with suction Wound Therapy - Frequency: 6X / week Wound Therapy - Follow Up Recommendations: Skilled nursing facility Wound Plan: see above  Wound Therapy Goals- Improve the function of patient's integumentary system by progressing the wound(s) through the phases of wound healing (inflammation - proliferation - remodeling) by: Decrease Necrotic Tissue to: 80 Decrease Necrotic Tissue - Progress: Progressing toward goal Increase Granulation Tissue to: 20 Increase Granulation Tissue - Progress: Progressing toward goal Goals/treatment plan/discharge plan were made with and agreed upon by patient/family: Yes Time For Goal Achievement: 7 days Wound Therapy - Potential for Goals: Fair  Goals will be updated until maximal potential achieved or discharge criteria met.  Discharge criteria: when goals achieved, discharge from hospital, MD decision/surgical intervention, no progress towards goals, refusal/missing three consecutive treatments without notification or medical reason.  GP      Wyona Almas, PT, DPT Acute Rehabilitation Services Pager [phone] Office  [phone]   [name] 04/23/2020, 10:19 AM

## 2020-04-23 NOTE — Progress Notes (Signed)
Patient ID: Brent [name]., male   DOB: [birth year], 74 y.o.   MRN: [id number]   Patient will transport to [place] on tomorrow (8/6) transport set for 3PM!

## 2020-04-23 NOTE — Progress Notes (Signed)
Patient ID: Brent Opdahl., male   DOB: 12/19/1945, 74 y.o.   MRN: [id number]   Sw followed up with patient. He was concerned about his bed options to SNF. Sw informed him that re have not received a follow up from [facility] or [place]. Patient reports he was unaware of the 3 day bed window.   Patient informed sw that he would prefer to go to [facility]. Sw follow up with [facility] Admissions Director Irine Seal) they are able to take patient. AD will be running patient's Medicare and will confirm what day patient will be able to transport. Most likely tomorrow. Will keep everyone updated.

## 2020-04-23 NOTE — Progress Notes (Addendum)
Patient receiving Keflex q6hrs. 18:00 dose rescheduled to 20:00 by day shift nurse; Not given until 21:47. Pharmacy contacted about adjusting time of medication. Pharmacy rescheduled keflex. Midnight dose charted as duplicate per pharmacy  instruction, and next dose moved to 04:00 to resume q6 schedule.

## 2020-04-23 NOTE — Progress Notes (Signed)
Recreational Therapy Discharge Summary Patient Details  Name: Brent [name]. MRN: [id number] Date of Birth: 1946/07/14 Today's Date: 04/23/2020  Long term goals set: 1  Long term goals met: 0  Comments on progress toward goals: Pt is discharging to SNF for continued therapies and 24 hour supervision/assistance.  TR sessions focused on activity analysis identifying potential modifications, stress management techniques, activity tolerance, dynamic sitting balance for simple tasks, self advocacy and discharge planning.  Pt requires min-mod assist reaching outside BOS for leisure task and min assist for simple tasks.  Pt is motivated and hopeful to make further progress at SNF.  Multiple conversation with pt about the importance of self advocacy and identifying ways in which to do that at SNF.  Pt states understanding.   Reasons goals not met: Pt requires min assist for balance.  Reasons for discharge: discharge from hospital  Follow-up: Encourage particpation in activities program  Patient/family agrees with progress made and goals achieved: Yes  Henna Derderian 04/23/2020, 2:05 PM

## 2020-04-23 NOTE — Progress Notes (Signed)
[facility] PHYSICAL MEDICINE & REHABILITATION PROGRESS NOTE  Subjective/Complaints: Patient seen sitting up in bed this morning.  He states he did not sleep well overnight, but for no particular reason.  Discussed bowel movements with nursing, patient with good BM after digital stimulation yesterday.  He notes his stomach feels better.  ROS: Denies CP, SOB, N/V/D  Objective: Vital Signs: Blood pressure (!) 107/50, pulse (!) 54, temperature 97.8 F (36.6 C), resp. rate 14, height 6' (1.829 m), weight 108.4 kg, SpO2 99 %. DG Abd 1 View  Result Date: 04/22/2020 CLINICAL DATA:  Abdominal pain EXAM: ABDOMEN - 1 VIEW COMPARISON:  03/15/2020 FINDINGS: Scattered large and small bowel gas is noted. No free air is seen. Rounded calcification is noted in the left upper abdomen consistent with a renal cyst with milk of calcium within. No acute bony abnormality is noted. Degenerative changes of lumbar spine are seen. Mild retained fecal material is noted within the colon without definitive obstruction. IMPRESSION: Mild retained fecal material. No significant obstructive changes are seen. Electronically Signed   By: Inez Catalina M.D.   On: 04/22/2020 12:30   Recent Labs    04/22/20 0551  WBC 5.2  HGB 11.6*  HCT 35.9*  PLT 270   Recent Labs    04/22/20 0551  NA 136  K 4.3  CL 104  CO2 22  GLUCOSE 128*  BUN 23  CREATININE 0.82  CALCIUM 9.3    Physical Exam: BP (!) 107/50 (BP Location: Left Arm)   Pulse (!) 54   Temp 97.8 F (36.6 C)   Resp 14   Ht 6' (1.829 m)   Wt 108.4 kg   SpO2 99%   BMI 32.41 kg/m  Constitutional: No distress . Vital signs reviewed. HENT: Normocephalic.  Atraumatic. Eyes: EOMI. No discharge. Cardiovascular: No JVD.  RRR.  Respiratory: Normal effort.  No stridor.  Bilateral clear to auscultation.  GI: Non-distended.  BS +. Skin: Warm and dry.  Sacral ulcer not examined today. Psych: Normal mood.  Normal behavior. Musc: No edema in extremities.  No tenderness  in extremities. Neuro: Alert Motor:  RUE: Shoulder abduction, elbow flexion 5/5, wrist extension 4+/5, elbow ext 4+/5, distally 0/5, stable LUE: Shoulder abduction, elbow flexion 5/5, wrist extension 4-4+/5, elbow ext 4+/5, hand grip 0/5, stable B/l LE: 0/5 proxima to distal, stable  Assessment/Plan: 1. Functional deficits secondary to NMO/transverse myelitis which require 3+ hours per day of interdisciplinary therapy in a comprehensive inpatient rehab setting.  Physiatrist is providing close team supervision and 24 hour management of active medical problems listed below.  Physiatrist and rehab team continue to assess barriers to discharge/monitor patient progress toward functional and medical goals  Care Tool:  Bathing    Body parts bathed by patient: Right arm, Left arm, Chest, Abdomen, Front perineal area, Right upper leg, Left upper leg, Right lower leg, Left lower leg, Face   Body parts bathed by helper: Buttocks     Bathing assist Assist Level: Minimal Assistance - Patient > 75%     Upper Body Dressing/Undressing Upper body dressing Upper body dressing/undressing activity did not occur (including orthotics): N/A What is the patient wearing?: Pull over shirt    Upper body assist Assist Level: Minimal Assistance - Patient > 75%    Lower Body Dressing/Undressing Lower body dressing      What is the patient wearing?: Incontinence brief, Pants     Lower body assist Assist for lower body dressing: Maximal Assistance - Patient 25 -  49%     Toileting Toileting    Toileting assist Assist for toileting: Dependent - Patient 0%     Transfers Chair/bed transfer  Transfers assist     Chair/bed transfer assist level: Dependent - mechanical lift     Locomotion Ambulation   Ambulation assist   Ambulation activity did not occur: Safety/medical concerns (quadriplegia, decreased balance/postural control)          Walk 10 feet activity   Assist  Walk 10 feet  activity did not occur: Safety/medical concerns (quadriplegia, decreased balance/postural control)        Walk 50 feet activity   Assist Walk 50 feet with 2 turns activity did not occur: Safety/medical concerns (quadriplegia, decreased balance/postural control)         Walk 150 feet activity   Assist Walk 150 feet activity did not occur: Safety/medical concerns (quadriplegia, decreased balance/postural control)         Walk 10 feet on uneven surface  activity   Assist Walk 10 feet on uneven surfaces activity did not occur: Safety/medical concerns (quadriplegia, decreased balance/postural control)         Wheelchair     Assist Will patient use wheelchair at discharge?: Yes Type of Wheelchair: Manual    Wheelchair assist level: Supervision/Verbal cueing Max wheelchair distance: >5102ft    Wheelchair 50 feet with 2 turns activity    Assist        Assist Level: Supervision/Verbal cueing   Wheelchair 150 feet activity     Assist     Assist Level: Supervision/Verbal cueing      Medical Problem List and Plan: 1.  Quadriparesis secondary to transverse myelitis/NMO occurred 1 mo post 2nd Covid vaccine .  IV IG completed 03/20/2020 as well as received Rituxan dose 03/24/2020, repeat dose on 7/20.   Continue CIR, qdaily now  B/L PRAFO nightly  Discussed with therapies again, will hold off on tenodesis splint at present.  SNF placement?  Today 2.  Antithrombotics: -DVT/anticoagulation: Lovenox                Vascular study negative for DVT             -antiplatelet therapy: Aspirin 81 mg daily 3. Pain Management: Lidoderm patch, tramadol as needed  Controlled on 8/5  Monitor with increased exertion 4. Mood: Zoloft 150 mg nightly, Ativan as needed             -antipsychotic agents: N/A 5. Neuropsych: This patient is capable of making decisions on his own behalf. 6. Skin/Wound Care: Routine skin checks  Air mattress ordered  Continue to encourage  pressure-relief  Ongoing hydrotherapy.  WOC following 7. Fluids/Electrolytes/Nutrition: Routine in and outs.   BMP within acceptable range on 7/26 8.  Diabetes mellitus type 2 with hyperglycemia.  Hemoglobin A1c 6.7.    On Jardiance 12.5 mg daily, Metformin 1000 mg twice daily, Lantus 30 units, NovoLog 25-30 units 3 times daily PTA  Lantus insulin 30 units daily  Metformin 500 twice daily resumed on 7/22  Jardiance 10 started on 7/16  Slightly elevated on 8/5 9. HCAP/OSA.  Completed course of Maxipime initiated 03/18/2020 x14 doses.  Continue BiPAP 10. Hyperlipidemia. Lipitor/Vascepa 11. Hypertension.  Lisinopril 20 mg daily, decreased to 10 on 7/23, decreased to 5 on 8/5  Relatively controlled on 8/4  Monitor with increased mobility 12.  Transaminitis  LFTs elevated, but improving on 8/4  Continue to monitor  Continue to monitor 13.  Leukopenia- medication induced, resolved  WBCs 5.2 on 8/4  Discussed with pharmacy - [place] WNL  Continue to monitor 14.  Acute blood loss anemia  Hemoglobin 11.6 on 8/4  Continue to monitor 10.  Neurogenic bowel/bladder  Indwelling Foley due to traumatic I/O caths, DC Foley with I/O caths   Tolerating   Started Flomax 0.4 mg nightly  Bowel program:    -miralax stopped   -added probiotic 7/17   -PM suppository DC'd on 7/19   -metamucil d/ced   Continue digital stimulation   KUB reviewed-relatively unremarkable   Regular BMs. 11.  E. coli UTI  Bactrim changed to Keflex on 8/4, discussed with pharmacy again  LOS: 29 days A FACE TO FACE EVALUATION WAS PERFORMED  Sharie Amorin Lorie Phenix 04/23/2020, 8:49 AM

## 2020-04-23 NOTE — Progress Notes (Signed)
Dig stim performed due to patient complaining of tightness in stomach. Small BM of soft/mushy stool expelled.

## 2020-04-23 NOTE — Progress Notes (Signed)
Recreational Therapy Session Note  Patient Details  Name: Brent [name]. MRN: [id number] Date of Birth: June 29, 1946 Today's Date: 04/23/2020  Pain: no c/o Skilled Therapeutic Interventions/Progress Updates: Pt in bed upon arrival stating confusion/frustration with discharge planning.  Session spent reviewing documented information about SNF discharge.  Pt stated being unaware of previous deadlines in choosing a facility.  Pt is anxious to know which facilities are offering beds so that he can make a decision.  SW made aware of the above.  Again, encouraged/reviewed self advocacy, pt stated understanding.  Therapy/Group: Individual Therapy  Yobani Schertzer 04/23/2020, 11:55 AM

## 2020-04-23 NOTE — Progress Notes (Signed)
Occupational Therapy Session Note  Patient Details  Name: Brent [name]. MRN: [id number] Date of Birth: Sep 11, 1946  Today's Date: 04/23/2020 OT Individual Time: 0677-0340 OT Individual Time Calculation (min): 54 min    Short Term Goals: Week 1:  OT Short Term Goal 1 (Week 1): patient will increase bilateral hand ROM/dexterity and strength to perform eating and basic grooming tasks with min A OT Short Term Goal 1 - Progress (Week 1): Met OT Short Term Goal 2 (Week 1): patient will increase UB adl to mod A bed level - progress to seated in w/c as able OT Short Term Goal 2 - Progress (Week 1): Met OT Short Term Goal 3 (Week 1): patient will roll in bed with mod A and tolerate sitting in TIS w/c for 1 hour OT Short Term Goal 3 - Progress (Week 1): Partly met OT Short Term Goal 4 (Week 1): patient will direct lift transfer with mod cues OT Short Term Goal 4 - Progress (Week 1): Progressing toward goal OT Short Term Goal 5 (Week 1): patient will monitor and direct weight shifts with min cues OT Short Term Goal 5 - Progress (Week 1): Progressing toward goal  Skilled Therapeutic Interventions/Progress Updates:    1:1. Pt and wife present seated in [place]. Pt reporting 7/10 pain in neck and stomach. RN alerted and delivered pain medicaiton. OT adjusts head rest on PWC for more supportive position and applies heat pack to neck with mild relief. Pt agreeable to going outside and completes PWC mobility on/off of elevators with MOD I. OT demo and educates on cervical stretches for pain relief with slight improvement in pain (lowered to 5/10 from 7/10) with heat reaplied after stretches. Pt able to independently tilt PWB back for pressure relief. Pt returned to room, exit alarm on and call lig hin reach Therapy Documentation Precautions:  Precautions Precautions: Fall Precaution Comments: quadriplegia Restrictions Weight Bearing Restrictions: No General:   Vital Signs: Therapy Vitals Temp: 98  F (36.7 C) Pulse Rate: (!) 51 Resp: 18 BP: (!) 125/47 Patient Position (if appropriate): Sitting Oxygen Therapy SpO2: 100 % O2 Device: Room Air Pain:   ADL: ADL Eating: Maximal assistance Where Assessed-Eating: Bed level Grooming: Maximal assistance Where Assessed-Grooming: Bed level Upper Body Bathing: Maximal assistance Where Assessed-Upper Body Bathing: Bed level Lower Body Bathing: Dependent Where Assessed-Lower Body Bathing: Bed level Upper Body Dressing: Maximal assistance Where Assessed-Upper Body Dressing: Bed level Lower Body Dressing: Dependent Where Assessed-Lower Body Dressing: Bed level Toileting: Dependent Where Assessed-Toileting: Bed level Vision   Perception    Praxis   Exercises:   Other Treatments:     Therapy/Group: Individual Therapy  Tonny Branch 04/23/2020, 1:57 PM

## 2020-04-23 NOTE — Progress Notes (Signed)
Physical Therapy Discharge Summary  Patient Details  Name: Brent [name]. MRN: [id number] Date of Birth: Dec 24, 1945  Patient has met 4 of 5 long term goals due to improved activity tolerance, improved balance, improved postural control, improved awareness and improved coordination. Patient to discharge at a wheelchair level Total Assist. Patient's care partner is unavailable to provide the necessary physical assistance at discharge. Pt currently requires max/total A +2 for transfers, mobility, and safety. Therefore, pt/pt's wife agreeable to pt discharging to SNF.   Reasons goals not met: Pt unable to meet bed<>chair transfer goal of max A due to sacral wounds. Pt has been transferring bed<>power chair via Maximove dependently with +2 assist to preserve sacral wounds. However, pt initially transferred bed<>chair via slideboard with max A +2.   Recommendation:  Patient will benefit from ongoing skilled PT services in skilled nursing facility setting to continue to advance safe functional mobility, address ongoing impairments in transfers, generalized strengthening, dynamic sitting/standing balance/coordination, endurance, and to minimize fall risk.  Equipment: No equipment provided  Reasons for discharge: treatment goals met, discharge from hospital and pt making minimal and very slow progress  Patient/family agrees with progress made and goals achieved: Yes  PT Discharge Precautions/Restrictions Precautions Precautions: Fall Precaution Comments: quadriplegia Restrictions Weight Bearing Restrictions: No Cognition Overall Cognitive Status: Within Functional Limits for tasks assessed Arousal/Alertness: Awake/alert Orientation Level: Oriented X4 Memory: Appears intact Awareness: Appears intact Problem Solving: Impaired Safety/Judgment: Impaired Sensation Sensation Light Touch: Appears Intact Proprioception: Impaired by gross assessment Coordination Gross Motor Movements are  Fluid and Coordinated: No Fine Motor Movements are Fluid and Coordinated: No Coordination and Movement Description: grossly uncoordinated due to quadriplegia, decreased balance/postural and trunk control, generalized weakness, and poor activty tolerance Finger Nose Finger Test: mild dysmetria bilaterally Heel Shin Test: unable to perform bilaterally due to quadriplegia Motor  Motor Motor: Abnormal postural alignment and control;Other (comment) (quadriplegia) Motor - Skilled Clinical Observations: grossly uncoordinated due to quadriplegia, decreased balance/postural and trunk control, generalized weakness, and poor activity tolerance  Mobility Bed Mobility Bed Mobility: Rolling Right;Rolling Left;Sit to Supine;Supine to Sit Rolling Right: Minimal Assistance - Patient > 75% Rolling Left: Minimal Assistance - Patient > 75% Supine to Sit: Maximal Assistance - Patient - Patient 25-49% Sit to Supine: Maximal Assistance - Patient 25-49% Transfers Transfers: Transfer via Geophysicist/field seismologist;Lateral/Scoot Transfers Lateral/Scoot Transfers: 2 Press photographer (Assistive device): Other (Comment) (initially used slideboard, now have to use Maximove due to sacral wounds) Transfer via Lift Equipment: Occupational psychologist Ambulation: No Gait Gait: No Stairs / Additional Locomotion Stairs: No Architect: Yes Wheelchair Assistance: Independent with Camera operator: Power Wheelchair Parts Management: Needs assistance Distance: >537f  Trunk/Postural Assessment  Cervical Assessment Cervical Assessment: Within FScientist, physiologicalAssessment: Exceptions to [facility](mild kyphosis) Lumbar Assessment Lumbar Assessment: Exceptions to [facility](posterior pelvic tilt) Postural Control Righting Reactions: delayed  Balance Balance Balance Assessed: Yes Static Sitting Balance Static Sitting - Balance Support: Bilateral upper  extremity supported;Feet supported Static Sitting - Level of Assistance: 5: Stand by assistance (CGA) Dynamic Sitting Balance Dynamic Sitting - Balance Support: Bilateral upper extremity supported;Feet supported Dynamic Sitting - Level of Assistance: 5: Stand by assistance (CGA) Extremity Assessment  RLE Assessment RLE Assessment: Exceptions to [facility] Strength Comments: 0/5 due to quadriplegia LLE Assessment LLE Assessment: Exceptions to WR[facility] SystemGeneral Strength Comments: 0/5 due to quadriplegia   AAlfonse AlpersPT, DPT  04/23/2020, 5:12 PM

## 2020-04-24 DIAGNOSIS — G9589 Other specified diseases of spinal cord: Secondary | ICD-10-CM | POA: Diagnosis not present

## 2020-04-24 DIAGNOSIS — M533 Sacrococcygeal disorders, not elsewhere classified: Secondary | ICD-10-CM | POA: Diagnosis not present

## 2020-04-24 DIAGNOSIS — I119 Hypertensive heart disease without heart failure: Secondary | ICD-10-CM | POA: Diagnosis not present

## 2020-04-24 DIAGNOSIS — Z20822 Contact with and (suspected) exposure to covid-19: Secondary | ICD-10-CM | POA: Diagnosis not present

## 2020-04-24 DIAGNOSIS — R509 Fever, unspecified: Secondary | ICD-10-CM | POA: Diagnosis not present

## 2020-04-24 DIAGNOSIS — L0390 Cellulitis, unspecified: Secondary | ICD-10-CM | POA: Diagnosis not present

## 2020-04-24 DIAGNOSIS — L89156 Pressure-induced deep tissue damage of sacral region: Secondary | ICD-10-CM | POA: Diagnosis not present

## 2020-04-24 DIAGNOSIS — R404 Transient alteration of awareness: Secondary | ICD-10-CM | POA: Diagnosis not present

## 2020-04-24 DIAGNOSIS — E871 Hypo-osmolality and hyponatremia: Secondary | ICD-10-CM | POA: Diagnosis not present

## 2020-04-24 DIAGNOSIS — Z9189 Other specified personal risk factors, not elsewhere classified: Secondary | ICD-10-CM | POA: Diagnosis not present

## 2020-04-24 DIAGNOSIS — R1084 Generalized abdominal pain: Secondary | ICD-10-CM | POA: Diagnosis not present

## 2020-04-24 DIAGNOSIS — R5381 Other malaise: Secondary | ICD-10-CM | POA: Diagnosis not present

## 2020-04-24 DIAGNOSIS — R110 Nausea: Secondary | ICD-10-CM | POA: Diagnosis not present

## 2020-04-24 DIAGNOSIS — G4733 Obstructive sleep apnea (adult) (pediatric): Secondary | ICD-10-CM | POA: Diagnosis present

## 2020-04-24 DIAGNOSIS — G360 Neuromyelitis optica [Devic]: Secondary | ICD-10-CM | POA: Diagnosis not present

## 2020-04-24 DIAGNOSIS — E118 Type 2 diabetes mellitus with unspecified complications: Secondary | ICD-10-CM | POA: Diagnosis not present

## 2020-04-24 DIAGNOSIS — L89154 Pressure ulcer of sacral region, stage 4: Secondary | ICD-10-CM | POA: Diagnosis not present

## 2020-04-24 DIAGNOSIS — I10 Essential (primary) hypertension: Secondary | ICD-10-CM | POA: Diagnosis not present

## 2020-04-24 DIAGNOSIS — L89510 Pressure ulcer of right ankle, unstageable: Secondary | ICD-10-CM | POA: Diagnosis not present

## 2020-04-24 DIAGNOSIS — U071 COVID-19: Secondary | ICD-10-CM | POA: Diagnosis not present

## 2020-04-24 DIAGNOSIS — K592 Neurogenic bowel, not elsewhere classified: Secondary | ICD-10-CM | POA: Diagnosis not present

## 2020-04-24 DIAGNOSIS — R7982 Elevated C-reactive protein (CRP): Secondary | ICD-10-CM | POA: Diagnosis not present

## 2020-04-24 DIAGNOSIS — Z87891 Personal history of nicotine dependence: Secondary | ICD-10-CM | POA: Diagnosis not present

## 2020-04-24 DIAGNOSIS — Z85820 Personal history of malignant melanoma of skin: Secondary | ICD-10-CM | POA: Diagnosis not present

## 2020-04-24 DIAGNOSIS — E1169 Type 2 diabetes mellitus with other specified complication: Secondary | ICD-10-CM | POA: Diagnosis present

## 2020-04-24 DIAGNOSIS — Z794 Long term (current) use of insulin: Secondary | ICD-10-CM | POA: Diagnosis not present

## 2020-04-24 DIAGNOSIS — E785 Hyperlipidemia, unspecified: Secondary | ICD-10-CM | POA: Diagnosis present

## 2020-04-24 DIAGNOSIS — E782 Mixed hyperlipidemia: Secondary | ICD-10-CM | POA: Diagnosis not present

## 2020-04-24 DIAGNOSIS — E860 Dehydration: Secondary | ICD-10-CM | POA: Diagnosis not present

## 2020-04-24 DIAGNOSIS — R2681 Unsteadiness on feet: Secondary | ICD-10-CM | POA: Diagnosis not present

## 2020-04-24 DIAGNOSIS — Z7189 Other specified counseling: Secondary | ICD-10-CM | POA: Diagnosis not present

## 2020-04-24 DIAGNOSIS — J392 Other diseases of pharynx: Secondary | ICD-10-CM | POA: Diagnosis not present

## 2020-04-24 DIAGNOSIS — F331 Major depressive disorder, recurrent, moderate: Secondary | ICD-10-CM | POA: Diagnosis not present

## 2020-04-24 DIAGNOSIS — M4628 Osteomyelitis of vertebra, sacral and sacrococcygeal region: Secondary | ICD-10-CM | POA: Diagnosis present

## 2020-04-24 DIAGNOSIS — D72828 Other elevated white blood cell count: Secondary | ICD-10-CM | POA: Diagnosis not present

## 2020-04-24 DIAGNOSIS — J811 Chronic pulmonary edema: Secondary | ICD-10-CM | POA: Diagnosis not present

## 2020-04-24 DIAGNOSIS — E46 Unspecified protein-calorie malnutrition: Secondary | ICD-10-CM | POA: Diagnosis not present

## 2020-04-24 DIAGNOSIS — E1165 Type 2 diabetes mellitus with hyperglycemia: Secondary | ICD-10-CM | POA: Diagnosis not present

## 2020-04-24 DIAGNOSIS — F411 Generalized anxiety disorder: Secondary | ICD-10-CM | POA: Diagnosis not present

## 2020-04-24 DIAGNOSIS — B9620 Unspecified Escherichia coli [E. coli] as the cause of diseases classified elsewhere: Secondary | ICD-10-CM | POA: Diagnosis not present

## 2020-04-24 DIAGNOSIS — M4646 Discitis, unspecified, lumbar region: Secondary | ICD-10-CM | POA: Diagnosis not present

## 2020-04-24 DIAGNOSIS — I959 Hypotension, unspecified: Secondary | ICD-10-CM | POA: Diagnosis not present

## 2020-04-24 DIAGNOSIS — A498 Other bacterial infections of unspecified site: Secondary | ICD-10-CM | POA: Diagnosis not present

## 2020-04-24 DIAGNOSIS — K1230 Oral mucositis (ulcerative), unspecified: Secondary | ICD-10-CM | POA: Diagnosis not present

## 2020-04-24 DIAGNOSIS — L89153 Pressure ulcer of sacral region, stage 3: Secondary | ICD-10-CM | POA: Diagnosis not present

## 2020-04-24 DIAGNOSIS — Z79899 Other long term (current) drug therapy: Secondary | ICD-10-CM | POA: Diagnosis not present

## 2020-04-24 DIAGNOSIS — B370 Candidal stomatitis: Secondary | ICD-10-CM | POA: Diagnosis not present

## 2020-04-24 DIAGNOSIS — R1311 Dysphagia, oral phase: Secondary | ICD-10-CM | POA: Diagnosis present

## 2020-04-24 DIAGNOSIS — Z6836 Body mass index (BMI) 36.0-36.9, adult: Secondary | ICD-10-CM | POA: Diagnosis not present

## 2020-04-24 DIAGNOSIS — M6281 Muscle weakness (generalized): Secondary | ICD-10-CM | POA: Diagnosis not present

## 2020-04-24 DIAGNOSIS — R41841 Cognitive communication deficit: Secondary | ICD-10-CM | POA: Diagnosis not present

## 2020-04-24 DIAGNOSIS — E440 Moderate protein-calorie malnutrition: Secondary | ICD-10-CM | POA: Diagnosis not present

## 2020-04-24 DIAGNOSIS — G8220 Paraplegia, unspecified: Secondary | ICD-10-CM | POA: Diagnosis present

## 2020-04-24 DIAGNOSIS — M549 Dorsalgia, unspecified: Secondary | ICD-10-CM | POA: Diagnosis not present

## 2020-04-24 DIAGNOSIS — G8250 Quadriplegia, unspecified: Secondary | ICD-10-CM | POA: Diagnosis not present

## 2020-04-24 DIAGNOSIS — N318 Other neuromuscular dysfunction of bladder: Secondary | ICD-10-CM | POA: Diagnosis not present

## 2020-04-24 DIAGNOSIS — R1312 Dysphagia, oropharyngeal phase: Secondary | ICD-10-CM | POA: Diagnosis not present

## 2020-04-24 DIAGNOSIS — M47812 Spondylosis without myelopathy or radiculopathy, cervical region: Secondary | ICD-10-CM | POA: Diagnosis not present

## 2020-04-24 DIAGNOSIS — K6812 Psoas muscle abscess: Secondary | ICD-10-CM | POA: Diagnosis present

## 2020-04-24 DIAGNOSIS — R748 Abnormal levels of other serum enzymes: Secondary | ICD-10-CM | POA: Diagnosis not present

## 2020-04-24 DIAGNOSIS — R600 Localized edema: Secondary | ICD-10-CM | POA: Diagnosis not present

## 2020-04-24 DIAGNOSIS — L89150 Pressure ulcer of sacral region, unstageable: Secondary | ICD-10-CM | POA: Diagnosis not present

## 2020-04-24 DIAGNOSIS — J45901 Unspecified asthma with (acute) exacerbation: Secondary | ICD-10-CM | POA: Diagnosis not present

## 2020-04-24 DIAGNOSIS — D62 Acute posthemorrhagic anemia: Secondary | ICD-10-CM | POA: Diagnosis not present

## 2020-04-24 DIAGNOSIS — R7881 Bacteremia: Secondary | ICD-10-CM | POA: Diagnosis present

## 2020-04-24 DIAGNOSIS — M4644 Discitis, unspecified, thoracic region: Secondary | ICD-10-CM | POA: Diagnosis not present

## 2020-04-24 DIAGNOSIS — M8708 Idiopathic aseptic necrosis of bone, other site: Secondary | ICD-10-CM | POA: Diagnosis not present

## 2020-04-24 DIAGNOSIS — R1310 Dysphagia, unspecified: Secondary | ICD-10-CM | POA: Diagnosis not present

## 2020-04-24 DIAGNOSIS — Z955 Presence of coronary angioplasty implant and graft: Secondary | ICD-10-CM | POA: Diagnosis not present

## 2020-04-24 DIAGNOSIS — Z7401 Bed confinement status: Secondary | ICD-10-CM | POA: Diagnosis not present

## 2020-04-24 DIAGNOSIS — Z20828 Contact with and (suspected) exposure to other viral communicable diseases: Secondary | ICD-10-CM | POA: Diagnosis not present

## 2020-04-24 DIAGNOSIS — G373 Acute transverse myelitis in demyelinating disease of central nervous system: Secondary | ICD-10-CM | POA: Diagnosis not present

## 2020-04-24 DIAGNOSIS — R531 Weakness: Secondary | ICD-10-CM | POA: Diagnosis not present

## 2020-04-24 DIAGNOSIS — Z7982 Long term (current) use of aspirin: Secondary | ICD-10-CM | POA: Diagnosis not present

## 2020-04-24 DIAGNOSIS — D702 Other drug-induced agranulocytosis: Secondary | ICD-10-CM | POA: Diagnosis not present

## 2020-04-24 DIAGNOSIS — I6529 Occlusion and stenosis of unspecified carotid artery: Secondary | ICD-10-CM | POA: Diagnosis not present

## 2020-04-24 DIAGNOSIS — F419 Anxiety disorder, unspecified: Secondary | ICD-10-CM | POA: Diagnosis not present

## 2020-04-24 DIAGNOSIS — M2550 Pain in unspecified joint: Secondary | ICD-10-CM | POA: Diagnosis not present

## 2020-04-24 DIAGNOSIS — N319 Neuromuscular dysfunction of bladder, unspecified: Secondary | ICD-10-CM | POA: Diagnosis not present

## 2020-04-24 DIAGNOSIS — Z515 Encounter for palliative care: Secondary | ICD-10-CM | POA: Diagnosis not present

## 2020-04-24 DIAGNOSIS — N390 Urinary tract infection, site not specified: Secondary | ICD-10-CM | POA: Diagnosis not present

## 2020-04-24 DIAGNOSIS — M75101 Unspecified rotator cuff tear or rupture of right shoulder, not specified as traumatic: Secondary | ICD-10-CM | POA: Diagnosis not present

## 2020-04-24 DIAGNOSIS — E08622 Diabetes mellitus due to underlying condition with other skin ulcer: Secondary | ICD-10-CM | POA: Diagnosis not present

## 2020-04-24 DIAGNOSIS — R4189 Other symptoms and signs involving cognitive functions and awareness: Secondary | ICD-10-CM | POA: Diagnosis not present

## 2020-04-24 DIAGNOSIS — R627 Adult failure to thrive: Secondary | ICD-10-CM | POA: Diagnosis not present

## 2020-04-24 DIAGNOSIS — R532 Functional quadriplegia: Secondary | ICD-10-CM | POA: Diagnosis not present

## 2020-04-24 DIAGNOSIS — R32 Unspecified urinary incontinence: Secondary | ICD-10-CM | POA: Diagnosis present

## 2020-04-24 DIAGNOSIS — B3781 Candidal esophagitis: Secondary | ICD-10-CM | POA: Diagnosis present

## 2020-04-24 DIAGNOSIS — R682 Dry mouth, unspecified: Secondary | ICD-10-CM | POA: Diagnosis not present

## 2020-04-24 DIAGNOSIS — M4626 Osteomyelitis of vertebra, lumbar region: Secondary | ICD-10-CM | POA: Diagnosis present

## 2020-04-24 DIAGNOSIS — Z86010 Personal history of colonic polyps: Secondary | ICD-10-CM | POA: Diagnosis not present

## 2020-04-24 DIAGNOSIS — R1319 Other dysphagia: Secondary | ICD-10-CM | POA: Diagnosis not present

## 2020-04-24 DIAGNOSIS — L89310 Pressure ulcer of right buttock, unstageable: Secondary | ICD-10-CM | POA: Diagnosis not present

## 2020-04-24 DIAGNOSIS — E1142 Type 2 diabetes mellitus with diabetic polyneuropathy: Secondary | ICD-10-CM | POA: Diagnosis not present

## 2020-04-24 DIAGNOSIS — I2510 Atherosclerotic heart disease of native coronary artery without angina pectoris: Secondary | ICD-10-CM | POA: Diagnosis present

## 2020-04-24 DIAGNOSIS — E119 Type 2 diabetes mellitus without complications: Secondary | ICD-10-CM | POA: Diagnosis not present

## 2020-04-24 DIAGNOSIS — L0231 Cutaneous abscess of buttock: Secondary | ICD-10-CM | POA: Diagnosis not present

## 2020-04-24 DIAGNOSIS — R6883 Chills (without fever): Secondary | ICD-10-CM | POA: Diagnosis not present

## 2020-04-24 DIAGNOSIS — L89159 Pressure ulcer of sacral region, unspecified stage: Secondary | ICD-10-CM | POA: Diagnosis not present

## 2020-04-24 DIAGNOSIS — L98423 Non-pressure chronic ulcer of back with necrosis of muscle: Secondary | ICD-10-CM | POA: Diagnosis not present

## 2020-04-24 DIAGNOSIS — R159 Full incontinence of feces: Secondary | ICD-10-CM | POA: Diagnosis not present

## 2020-04-24 DIAGNOSIS — J029 Acute pharyngitis, unspecified: Secondary | ICD-10-CM | POA: Diagnosis not present

## 2020-04-24 LAB — GLUCOSE, CAPILLARY
Glucose-Capillary: 140 mg/dL — ABNORMAL HIGH (ref 70–99)
Glucose-Capillary: 170 mg/dL — ABNORMAL HIGH (ref 70–99)
Glucose-Capillary: 157 mg/dL — ABNORMAL HIGH (ref 70–99)

## 2020-04-24 MED ORDER — SACCHAROMYCES BOULARDII 250 MG PO CAPS: 250.0000 mg | ORAL_CAPSULE | Freq: Two times a day (BID) | ORAL | Status: DC

## 2020-04-24 MED ORDER — TAMSULOSIN HCL 0.4 MG PO CAPS: 0.4000 mg | ORAL_CAPSULE | Freq: Every day | ORAL | Status: DC

## 2020-04-24 MED ORDER — FLUTICASONE PROPIONATE 50 MCG/ACT NA SUSP: 2.0000 | Freq: Two times a day (BID) | NASAL | 2 refills | Status: DC

## 2020-04-24 MED ORDER — LIDOCAINE 5 % EX PTCH: 1.0000 | MEDICATED_PATCH | CUTANEOUS | 0 refills | Status: DC

## 2020-04-24 MED ORDER — EMPAGLIFLOZIN 10 MG PO TABS: 10.0000 mg | ORAL_TABLET | Freq: Every day | ORAL | Status: DC

## 2020-04-24 MED ORDER — LORAZEPAM 1 MG PO TABS: 1.0000 mg | ORAL_TABLET | Freq: Every evening | ORAL | 0 refills | Status: AC | PRN

## 2020-04-24 MED ORDER — ACETAMINOPHEN 325 MG PO TABS: 650.0000 mg | ORAL_TABLET | Freq: Four times a day (QID) | ORAL | Status: DC | PRN

## 2020-04-24 MED ORDER — LISINOPRIL 5 MG PO TABS: 5.0000 mg | ORAL_TABLET | Freq: Every day | ORAL | Status: DC

## 2020-04-24 MED ORDER — TRAMADOL HCL 50 MG PO TABS: 50.0000 mg | ORAL_TABLET | Freq: Three times a day (TID) | ORAL | 0 refills | Status: DC | PRN

## 2020-04-24 MED ORDER — ALBUTEROL SULFATE (2.5 MG/3ML) 0.083% IN NEBU: 2.5000 mg | INHALATION_SOLUTION | Freq: Once | RESPIRATORY_TRACT | 12 refills | Status: DC | PRN

## 2020-04-24 MED ORDER — NYSTATIN 100000 UNIT/GM EX POWD: 1.0000 "application " | Freq: Three times a day (TID) | CUTANEOUS | 0 refills | Status: DC

## 2020-04-24 MED ORDER — COLLAGENASE 250 UNIT/GM EX OINT: TOPICAL_OINTMENT | CUTANEOUS | 0 refills | Status: DC

## 2020-04-24 MED ORDER — CEPHALEXIN 500 MG PO CAPS: 500.0000 mg | ORAL_CAPSULE | Freq: Four times a day (QID) | ORAL | Status: DC

## 2020-04-24 NOTE — Progress Notes (Signed)
Report given to [place], at [facility] during shift. Waiting transportation at moment. Adria Devon, LPN

## 2020-04-24 NOTE — Progress Notes (Signed)
Occupational Therapy Discharge Summary  Patient Details  Name: Brent [name]. MRN: [id number] Date of Birth: 1945/10/14   Patient has met 5 of 8 long term goals due to improved activity tolerance, improved balance, postural control, ability to compensate for deficits and improved awareness.  Patient to discharge at overall min assist for bathing at bed level and UB dressing in supported sitting, max assist for LB dressing, and use of mechanical lift for transfers level.  Patient's care partner unavailable to provide the necessary physical assistance at discharge.   Reasons goals not met: Pt continues to require increased physical assistance with toileting and LB dressing due to decreased sitting balance and functional use of BUE  Recommendation:  Patient will benefit from ongoing skilled OT services in skilled nursing facility setting to continue to advance functional skills in the area of BADL and Reduce care partner burden.  Equipment: No equipment provided  Reasons for discharge: treatment goals met and discharge from hospital  Patient/family agrees with progress made and goals achieved: Yes  OT Discharge Precautions/Restrictions  Precautions Precautions: Fall Precaution Comments: quadriplegia ADL ADL Equipment Provided: Feeding equipment Eating: Set up Where Assessed-Eating: Chair Grooming: Setup Where Assessed-Grooming: Sitting at sink Upper Body Bathing: Setup Where Assessed-Upper Body Bathing: Bed level, Sitting at sink Lower Body Bathing: Minimal assistance Where Assessed-Lower Body Bathing: Bed level Upper Body Dressing: Minimal assistance Where Assessed-Upper Body Dressing: Sitting at sink Lower Body Dressing: Maximal assistance Where Assessed-Lower Body Dressing: Bed level Toileting: Dependent Where Assessed-Toileting: Bed level Toilet Transfer:  (unsafe due to wound on sacrum) Vision Baseline Vision/History: Wears glasses Wears Glasses: At all  times Patient Visual Report: No change from baseline Vision Assessment?: No apparent visual deficits Perception  Perception: Within Functional Limits Praxis Praxis: Intact Cognition Overall Cognitive Status: Within Functional Limits for tasks assessed Arousal/Alertness: Awake/alert Memory: Appears intact Awareness: Appears intact Problem Solving: Impaired Safety/Judgment: Impaired Sensation Sensation Light Touch: Appears Intact Proprioception: Impaired by gross assessment Coordination Gross Motor Movements are Fluid and Coordinated: No Fine Motor Movements are Fluid and Coordinated: No Coordination and Movement Description: grossly uncoordinated due to quadriplegia, decreased balance/postural and trunk control, generalized weakness, and poor activty tolerance Finger Nose Finger Test: mild dysmetria bilaterally Heel Shin Test: unable to perform bilaterally due to quadriplegia Motor  Motor Motor: Abnormal postural alignment and control;Other (comment) (quadriplegia) Motor - Skilled Clinical Observations: grossly uncoordinated due to quadriplegia, decreased balance/postural and trunk control, generalized weakness, and poor activity tolerance Mobility  Bed Mobility Bed Mobility: Rolling Right;Rolling Left;Sit to Supine;Supine to Sit Rolling Right: Minimal Assistance - Patient > 75% Rolling Left: Minimal Assistance - Patient > 75% Supine to Sit: Maximal Assistance - Patient - Patient 25-49% Sit to Supine: Maximal Assistance - Patient 25-49%  Trunk/Postural Assessment  Cervical Assessment Cervical Assessment: Within Functional Limits Thoracic Assessment Thoracic Assessment: Exceptions to [facility] (mild kyphosis) Lumbar Assessment Lumbar Assessment: Exceptions to [facility] (posterior pelvic tilt) Postural Control Righting Reactions: delayed  Balance Balance Balance Assessed: Yes Static Sitting Balance Static Sitting - Balance Support: Bilateral upper extremity supported;Feet  supported Static Sitting - Level of Assistance: 5: Stand by assistance (CGA) Dynamic Sitting Balance Dynamic Sitting - Balance Support: Bilateral upper extremity supported;Feet supported Dynamic Sitting - Level of Assistance: 5: Stand by assistance (CGA) Extremity/Trunk Assessment RUE Assessment RUE Assessment: Exceptions to [facility] Passive Range of Motion (PROM) Comments: WFL Active Range of Motion (AROM) Comments: full at shoulder (supported by bed surface), elbow, forearm/wrist  - MP flex/ext 1/4-1/2, trace at IPs General  Strength Comments: grossly 4/5 shoulder, elbow, wrist, able to use tenodesis grasp LUE Assessment LUE Assessment: Exceptions to [facility] Passive Range of Motion (PROM) Comments: shoulder flex 120 otherwise [facility] Active Range of Motion (AROM) Comments: shoulder 3/4, elbow flex full, ext 3/4, forearm/wrist full, no digit flex/ext General Strength Comments: shoulder 3/4, elbow flex 4/5, ext 3/5, wrist/forearm 4/5, 0 digits - uses tenodesis motion for gross grasp   Brent [name], [facility] 04/24/2020, 3:15 PM

## 2020-04-24 NOTE — Progress Notes (Signed)
[place] PHYSICAL MEDICINE & REHABILITATION PROGRESS NOTE  Subjective/Complaints: Patient seen sitting up in bed this morning.  He states he slept well overnight.  He has questions regarding firm nodules on his abdomen.  He is aware of plans for discharge to SNF today.  He is questions regarding follow-up care there.  He notes he had a BM yesterday.  ROS: Denies CP, SOB, N/V/D  Objective: Vital Signs: Blood pressure (!) 112/47, pulse (!) 57, temperature 97.9 F (36.6 C), resp. rate 17, height 6' (1.829 m), weight 108.4 kg, SpO2 97 %. DG Abd 1 View  Result Date: 04/22/2020 CLINICAL DATA:  Abdominal pain EXAM: ABDOMEN - 1 VIEW COMPARISON:  03/15/2020 FINDINGS: Scattered large and small bowel gas is noted. No free air is seen. Rounded calcification is noted in the left upper abdomen consistent with a renal cyst with milk of calcium within. No acute bony abnormality is noted. Degenerative changes of lumbar spine are seen. Mild retained fecal material is noted within the colon without definitive obstruction. IMPRESSION: Mild retained fecal material. No significant obstructive changes are seen. Electronically Signed   By: Inez Catalina M.D.   On: 04/22/2020 12:30   Recent Labs    04/22/20 0551  WBC 5.2  HGB 11.6*  HCT 35.9*  PLT 270   Recent Labs    04/22/20 0551  NA 136  K 4.3  CL 104  CO2 22  GLUCOSE 128*  BUN 23  CREATININE 0.82  CALCIUM 9.3    Physical Exam: BP (!) 112/47 (BP Location: Left Arm)   Pulse (!) 57   Temp 97.9 F (36.6 C)   Resp 17   Ht 6' (1.829 m)   Wt 108.4 kg   SpO2 97%   BMI 32.41 kg/m  Constitutional: No distress . Vital signs reviewed. HENT: Normocephalic.  Atraumatic. Eyes: EOMI. No discharge. Cardiovascular: No JVD.  RRR. Respiratory: Normal effort.  No stridor.  Bilateral clear to auscultation GI: Non-distended. BS +.  Skin: Warm and dry.  Sacral ulcer not examined today. Warm nodules along abdomen Psych: Normal mood.  Normal  behavior. Musc: No edema in extremities.  No tenderness in extremities. Neuro: Alert Motor:  RUE: Shoulder abduction, elbow flexion 5/5, wrist extension 4+/5, elbow ext 4+/5, distally 0/5, unchanged LUE: Shoulder abduction, elbow flexion 5/5, wrist extension 4-4+/5, elbow ext 4+/5, hand grip 0/5, unchanged B/l LE: 0/5 proxima to distal, stable  Assessment/Plan: 1. Functional deficits secondary to NMO/transverse myelitis which require 3+ hours per day of interdisciplinary therapy in a comprehensive inpatient rehab setting.  Physiatrist is providing close team supervision and 24 hour management of active medical problems listed below.  Physiatrist and rehab team continue to assess barriers to discharge/monitor patient progress toward functional and medical goals  Care Tool:  Bathing    Body parts bathed by patient: Right arm, Left arm, Chest, Abdomen, Front perineal area, Right upper leg, Left upper leg, Right lower leg, Left lower leg, Face   Body parts bathed by helper: Buttocks     Bathing assist Assist Level: Minimal Assistance - Patient > 75%     Upper Body Dressing/Undressing Upper body dressing Upper body dressing/undressing activity did not occur (including orthotics): N/A What is the patient wearing?: Pull over shirt    Upper body assist Assist Level: Minimal Assistance - Patient > 75%    Lower Body Dressing/Undressing Lower body dressing      What is the patient wearing?: Incontinence brief, Pants     Lower body assist  Assist for lower body dressing: Maximal Assistance - Patient 25 - 49%     Toileting Toileting    Toileting assist Assist for toileting: Dependent - Patient 0%     Transfers Chair/bed transfer  Transfers assist     Chair/bed transfer assist level: Dependent - mechanical lift     Locomotion Ambulation   Ambulation assist   Ambulation activity did not occur: Safety/medical concerns (quadriplegia, decreased balance/postural and trunk  control, generalized weakness, poor activity tolerance)          Walk 10 feet activity   Assist  Walk 10 feet activity did not occur: Safety/medical concerns (quadriplegia, decreased balance/postural and trunk control, generalized weakness, poor activity tolerance)        Walk 50 feet activity   Assist Walk 50 feet with 2 turns activity did not occur: Safety/medical concerns (quadriplegia, decreased balance/postural and trunk control, generalized weakness, poor activity tolerance)         Walk 150 feet activity   Assist Walk 150 feet activity did not occur: Safety/medical concerns (quadriplegia, decreased balance/postural and trunk control, generalized weakness, poor activity tolerance)         Walk 10 feet on uneven surface  activity   Assist Walk 10 feet on uneven surfaces activity did not occur: Safety/medical concerns (quadriplegia, decreased balance/postural and trunk control, generalized weakness, poor activity tolerance)         Wheelchair     Assist Will patient use wheelchair at discharge?: Yes Type of Wheelchair: Power    Wheelchair assist level: Independent Max wheelchair distance: >558ft    Wheelchair 50 feet with 2 turns activity    Assist        Assist Level: Independent   Wheelchair 150 feet activity     Assist     Assist Level: Independent      Medical Problem List and Plan: 1.  Quadriparesis secondary to transverse myelitis/NMO occurred 1 mo post 2nd Covid vaccine .  IV IG completed 03/20/2020 as well as received Rituxan dose 03/24/2020, repeat dose on 7/20.   Plan for discharge and SNF today  Will see patient in 1 month or hospital follow-up  B/L PRAFO nightly  Discussed with therapies again, will hold off on tenodesis splint at present. 2.  Antithrombotics: -DVT/anticoagulation: Lovenox                Vascular study negative for DVT             -antiplatelet therapy: Aspirin 81 mg daily 3. Pain Management:  Lidoderm patch, tramadol as needed  Controlled on 8/6  Monitor with increased exertion 4. Mood: Zoloft 150 mg nightly, Ativan as needed             -antipsychotic agents: N/A 5. Neuropsych: This patient is capable of making decisions on his own behalf. 6. Skin/Wound Care: Routine skin checks  Air mattress ordered  Continue to encourage pressure-relief  Ongoing hydrotherapy during CIR, will DC on Santyl.  WOC following  Firm nodule on abdomen likely sites of anticoagulation injection-can use warm compress for symptomatic relief 7. Fluids/Electrolytes/Nutrition: Routine in and outs.   BMP within acceptable range on 7/26 8.  Diabetes mellitus type 2 with hyperglycemia.  Hemoglobin A1c 6.7.    On Jardiance 12.5 mg daily, Metformin 1000 mg twice daily, Lantus 30 units, NovoLog 25-30 units 3 times daily PTA  Lantus insulin 30 units daily  Metformin 500 twice daily resumed on 7/22  Jardiance 10 started on 7/16  Slightly  elevated on 8/6, continue to monitor at SNF with further adjustments as necessary. 9. HCAP/OSA.  Completed course of Maxipime initiated 03/18/2020 x14 doses.  Continue BiPAP 10. Hyperlipidemia. Lipitor/Vascepa 11. Hypertension.  Lisinopril 20 mg daily, decreased to 10 on 7/23, decreased to 5 on 8/5  Controlled on 8/6  Monitor with increased mobility 12.  Transaminitis  LFTs elevated, but improving on 8/4  Continue to monitor  Continue to monitor 13.  Leukopenia- medication induced, resolved  WBCs 5.2 on 8/4  Discussed with pharmacy - [facility] W[facility]  Continue to monitor 14.  Acute blood loss anemia  Hemoglobin 11.6 on 8/4  Continue to monitor 10.  Neurogenic bowel/bladder  Indwelling Foley due to traumatic I/O caths, DC Foley with I/O caths   Tolerating   Started Flomax 0.4 mg nightly  Bowel program:    -miralax stopped   -added probiotic 7/17   -PM suppository DC'd on 7/19   -metamucil d/ced   Continue digital stimulation   KUB reviewed-relatively  unremarkable   Regular BMs with bowel program. 11.  E. coli UTI  Bactrim changed to Keflex on 8/4 for 7 days total  > 30 minutes spent in total in discharge planning between myself and PA regarding aforementioned, as well discussion regarding DME equipment (although ultimately ended up going to SNF), follow-up appointments, discharge recommendations, discussions regarding discharge to home versus SNF, different SNF options.  LOS: 30 days A FACE TO FACE EVALUATION WAS PERFORMED  Vian Fluegel Lorie Phenix 04/24/2020, 9:22 AM

## 2020-04-24 NOTE — Progress Notes (Signed)
Patient cath placed per md order. Urine return noted. Adria Devon, LPN

## 2020-04-24 NOTE — Progress Notes (Signed)
Patient ID: Brent Fries., male   DOB: 1945/12/28, 74 y.o.   MRN: [id number]   Patient will d/c to SNF today, transport set for 3PM!

## 2020-04-24 NOTE — Plan of Care (Signed)
  Problem: SCI BOWEL ELIMINATION Goal: RH STG SCI MANAGE BOWEL PROGRAM W/ASSIST OR AS APPROPRIATE Description: STG SCI Manage bowel program w/assist or as appropriate. Max assist Outcome: Completed/Met   Problem: SCI BLADDER ELIMINATION Goal: RH STG SCI MANAGE BLADDER PROGRAM W/ASSISTANCE Description: Manage bladder with max assist Outcome: Completed/Met   Problem: RH SKIN INTEGRITY Goal: RH STG SKIN FREE OF INFECTION/BREAKDOWN Description: Prevent skin breakdown with max assist Outcome: Completed/Met Goal: RH STG ABLE TO PERFORM INCISION/WOUND CARE W/ASSISTANCE Description: STG Able To Perform Incision/Wound Care With Assistance. Manage peri area redness with mod assist Outcome: Completed/Met   Problem: RH PAIN MANAGEMENT Goal: RH STG PAIN MANAGED AT OR BELOW PT'S PAIN GOAL Description: Less than 4 Outcome: Completed/Met   Problem: Consults Goal: RH SPINAL CORD INJURY PATIENT EDUCATION Description:  See Patient Education module for education specifics.  Outcome: Completed/Met

## 2020-04-24 NOTE — Progress Notes (Signed)
Inpatient Rehabilitation Care Coordinator  Discharge Note  The overall goal for the admission was met for:   Discharge location: Yes, SNF [facility])  Length of Stay: Yes, 30 Days  Discharge activity level: Yes, Min A  Home/community participation: Yes  Services provided included: MD, RD, PT, OT, SLP, RN, CM, TR, Pharmacy, Neuropsych and SW  Financial Services: Medicare  Follow-up services arranged: Other: D/C to SNF. PT/OT/ST 5x a week  Comments (or additional information):  Patient/Family verbalized understanding of follow-up arrangements: Yes  Individual responsible for coordination of the follow-up plan: [name] [phone]  Confirmed correct DME delivered: Dyanne Iha 04/24/2020    Dyanne Iha

## 2020-04-27 DIAGNOSIS — G4733 Obstructive sleep apnea (adult) (pediatric): Secondary | ICD-10-CM | POA: Diagnosis not present

## 2020-04-27 DIAGNOSIS — G8250 Quadriplegia, unspecified: Secondary | ICD-10-CM | POA: Diagnosis not present

## 2020-04-27 DIAGNOSIS — L89153 Pressure ulcer of sacral region, stage 3: Secondary | ICD-10-CM | POA: Diagnosis not present

## 2020-04-27 DIAGNOSIS — D62 Acute posthemorrhagic anemia: Secondary | ICD-10-CM | POA: Diagnosis not present

## 2020-04-27 DIAGNOSIS — N318 Other neuromuscular dysfunction of bladder: Secondary | ICD-10-CM | POA: Diagnosis not present

## 2020-04-27 DIAGNOSIS — L89156 Pressure-induced deep tissue damage of sacral region: Secondary | ICD-10-CM | POA: Diagnosis not present

## 2020-04-27 DIAGNOSIS — G373 Acute transverse myelitis in demyelinating disease of central nervous system: Secondary | ICD-10-CM | POA: Diagnosis not present

## 2020-04-27 DIAGNOSIS — E118 Type 2 diabetes mellitus with unspecified complications: Secondary | ICD-10-CM | POA: Diagnosis not present

## 2020-04-27 DIAGNOSIS — N390 Urinary tract infection, site not specified: Secondary | ICD-10-CM | POA: Diagnosis not present

## 2020-04-27 DIAGNOSIS — K592 Neurogenic bowel, not elsewhere classified: Secondary | ICD-10-CM | POA: Diagnosis not present

## 2020-04-28 DIAGNOSIS — G8250 Quadriplegia, unspecified: Secondary | ICD-10-CM | POA: Diagnosis not present

## 2020-04-28 DIAGNOSIS — B9620 Unspecified Escherichia coli [E. coli] as the cause of diseases classified elsewhere: Secondary | ICD-10-CM | POA: Diagnosis not present

## 2020-04-28 DIAGNOSIS — G373 Acute transverse myelitis in demyelinating disease of central nervous system: Secondary | ICD-10-CM | POA: Diagnosis not present

## 2020-04-28 DIAGNOSIS — D62 Acute posthemorrhagic anemia: Secondary | ICD-10-CM | POA: Diagnosis not present

## 2020-04-28 DIAGNOSIS — D702 Other drug-induced agranulocytosis: Secondary | ICD-10-CM | POA: Diagnosis not present

## 2020-04-28 DIAGNOSIS — L89153 Pressure ulcer of sacral region, stage 3: Secondary | ICD-10-CM | POA: Diagnosis not present

## 2020-04-28 DIAGNOSIS — I10 Essential (primary) hypertension: Secondary | ICD-10-CM | POA: Diagnosis not present

## 2020-04-28 DIAGNOSIS — N390 Urinary tract infection, site not specified: Secondary | ICD-10-CM | POA: Diagnosis not present

## 2020-04-28 DIAGNOSIS — E118 Type 2 diabetes mellitus with unspecified complications: Secondary | ICD-10-CM | POA: Diagnosis not present

## 2020-04-28 DIAGNOSIS — R748 Abnormal levels of other serum enzymes: Secondary | ICD-10-CM | POA: Diagnosis not present

## 2020-04-28 DIAGNOSIS — N318 Other neuromuscular dysfunction of bladder: Secondary | ICD-10-CM | POA: Diagnosis not present

## 2020-04-28 DIAGNOSIS — K592 Neurogenic bowel, not elsewhere classified: Secondary | ICD-10-CM | POA: Diagnosis not present

## 2020-04-29 ENCOUNTER — Other Ambulatory Visit: Payer: Self-pay | Admitting: *Deleted

## 2020-04-29 NOTE — Patient Outreach (Signed)
Member screened for potential [facility] Care Management needs as a benefit of [place] Medicare.  Mr. Doren is receiving skilled therapy at [facility]. Communication sent to facility SW to inquire about transition plans.  Will continue to follow for potential [facility] Care Management needs while member resides in SNF.   Marthenia Rolling, MSN-Ed, RN,BSN [place] Acute Care Coordinator [phone] [facility]) [phone]  (Toll free office)

## 2020-04-30 DIAGNOSIS — R7982 Elevated C-reactive protein (CRP): Secondary | ICD-10-CM | POA: Diagnosis not present

## 2020-04-30 DIAGNOSIS — E46 Unspecified protein-calorie malnutrition: Secondary | ICD-10-CM | POA: Diagnosis not present

## 2020-04-30 DIAGNOSIS — E871 Hypo-osmolality and hyponatremia: Secondary | ICD-10-CM | POA: Diagnosis not present

## 2020-04-30 DIAGNOSIS — L89150 Pressure ulcer of sacral region, unstageable: Secondary | ICD-10-CM | POA: Diagnosis not present

## 2020-05-04 DIAGNOSIS — L89156 Pressure-induced deep tissue damage of sacral region: Secondary | ICD-10-CM | POA: Diagnosis not present

## 2020-05-04 DIAGNOSIS — G373 Acute transverse myelitis in demyelinating disease of central nervous system: Secondary | ICD-10-CM | POA: Diagnosis not present

## 2020-05-04 DIAGNOSIS — L89150 Pressure ulcer of sacral region, unstageable: Secondary | ICD-10-CM | POA: Diagnosis not present

## 2020-05-04 DIAGNOSIS — L0390 Cellulitis, unspecified: Secondary | ICD-10-CM | POA: Diagnosis not present

## 2020-05-04 DIAGNOSIS — E118 Type 2 diabetes mellitus with unspecified complications: Secondary | ICD-10-CM | POA: Diagnosis not present

## 2020-05-05 ENCOUNTER — Other Ambulatory Visit: Payer: Self-pay | Admitting: *Deleted

## 2020-05-05 NOTE — Patient Outreach (Signed)
[facility] Post- Acute Care Coordinator follow up. Member screened for potential [facility] Care Management needs as a benefit of [place] Medicare.  Mr. Diekman is receiving skilled therapy at [facility]. Facility SW reports next care plan meeting is scheduled in 2 weeks.   Mr. Marullo is from home with spouse. Writer will continue to follow for potential [facility] Care Management needs and transition plans.    Marthenia Rolling, MSN-Ed, RN,BSN [place] Acute Care Coordinator [phone] [facility]) [phone]  (Toll free office)

## 2020-05-06 ENCOUNTER — Encounter: Payer: Self-pay | Admitting: Neurology

## 2020-05-06 ENCOUNTER — Ambulatory Visit (INDEPENDENT_AMBULATORY_CARE_PROVIDER_SITE_OTHER): Payer: Medicare Other | Admitting: Neurology

## 2020-05-06 VITALS — BP 103/56 | HR 74 | Ht 72.0 in

## 2020-05-06 DIAGNOSIS — Z79899 Other long term (current) drug therapy: Secondary | ICD-10-CM

## 2020-05-06 DIAGNOSIS — I2510 Atherosclerotic heart disease of native coronary artery without angina pectoris: Secondary | ICD-10-CM | POA: Diagnosis not present

## 2020-05-06 DIAGNOSIS — G8250 Quadriplegia, unspecified: Secondary | ICD-10-CM | POA: Diagnosis not present

## 2020-05-06 DIAGNOSIS — K592 Neurogenic bowel, not elsewhere classified: Secondary | ICD-10-CM | POA: Diagnosis not present

## 2020-05-06 DIAGNOSIS — G360 Neuromyelitis optica [Devic]: Secondary | ICD-10-CM

## 2020-05-06 DIAGNOSIS — N319 Neuromuscular dysfunction of bladder, unspecified: Secondary | ICD-10-CM | POA: Diagnosis not present

## 2020-05-06 NOTE — Progress Notes (Signed)
[facility]  PATIENT: Brent [name]. DOB: 1945-12-05  REFERRING DOCTOR OR PCP: Lelon Frohlich, MD SOURCE: Patient, notes from recent hospitalization, multiple MRI reports, laboratory reports, multiple MRI images personally reviewed.  _________________________________   HISTORICAL  CHIEF COMPLAINT:  Chief Complaint  Patient presents with  . New Patient (Initial Visit)    RM 25 with wife. Internal referral from Lauraine Rinne, PA-C for transverse myelitis/neuromyelitis. Patient discharged from hospital to [facility] (phone: [phone]: [phone]). Has been there about a week and a half    HISTORY OF PRESENT ILLNESS:  I had the pleasure of seeing patient, Brent [name], at the [place] [facility] for neurologic consultation regarding his transverse myelitis and recent diagnosis of neuromyelitis optica  On March 08, 2020, while sitting at church, he started to have intense pain in his chest.  He was concerned about an MI and drove to his nearby house.   He continued to feel poorly and his wife called an ambulance.   He went to [facility] ED.and EKG was fine.  While in the ED, he started to experience numbness first in his right leg then left leg and then in the hands.     MRIs of the spine were initially felt to be non-contributary.   Initially a spinal stroke was suspected.   For one day, he had SOB and went to the ICU.   Anti-NMO Abs returned positive.   He received 5 day course of IV Solumedrol and 5 days IVIg.    Rituxan was infused 03/24/2020 and again 2 weeks later. He went to the Rehab floor and started to have some improvement.   Currently, he is doing better but is still very weak.   His arms are fine.    Hands are still weak and he has a reduced grip.    Legs are still 0/5.    He has no sensation in his groin though does in legs.    He has an indwelling catheter.   He wears glasses but has no visual changes.  He has a  sensation of full sensation.   He has had constipation but this feels different.    He had his Covid-19 vaccination Levan Hurst) in January an February.  He has diabetes mellitus and had hyperglycemia with the steroids in the hospital.  He may have had a mild polyneuropathy from the diabetes.   Images personally reviewed: MRI 03/16/2020 cervical and thoracic spine: There is a T2 hyperintense focus from C5-T4.  There is minimal patchy enhancement.  It has enlarged compared to the 03/10/2020 MRI.  MRI 03/10/2020 cervical and thoracic spine.  There is a patchy T2 hyperintense focus from T1-T4.  It did not enhance.  MRI brain 03/11/2020 shows mild generalized cortical atrophy and a Mega cisterna magna.  No acute findings.  NMO antibody was positive at 6.5 (less than 3 normal).  ANA, B12, SSA/B, copper, SARS-CoV-2 PCR, antiphospholipid, paraneoplastic panel were negative.   CSF showed mildly elevated protein and was otherwise normal.   REVIEW OF SYSTEMS: Constitutional: No fevers, chills, sweats, or change in appetite Eyes: No visual changes, double vision, eye pain Ear, nose and throat: No hearing loss, ear pain, nasal congestion, sore throat Cardiovascular: No chest pain, palpitations Respiratory: No shortness of breath at rest or with exertion.   No wheezes GastrointestinaI: No nausea, vomiting, diarrhea, abdominal pain, fecal incontinence Genitourinary: No dysuria, urinary retention or frequency.  No nocturia. Musculoskeletal: No neck pain, back  pain Integumentary: No rash, pruritus, skin lesions Neurological: as above Psychiatric: No depression at this time.  No anxiety Endocrine: No palpitations, diaphoresis, change in appetite, change in weigh or increased thirst Hematologic/Lymphatic: No anemia, purpura, petechiae. Allergic/Immunologic: No itchy/runny eyes, nasal congestion, recent allergic reactions, rashes  ALLERGIES: Allergies  Allergen Reactions  . Levofloxacin Rash  .  Penicillins Hives and Rash    Tolerated cefepime and cephalexin    HOME MEDICATIONS:  Current Outpatient Medications:  .  acetaminophen (TYLENOL) 325 MG tablet, Take 2 tablets (650 mg total) by mouth every 6 (six) hours as needed for headache., Disp: , Rfl:  .  albuterol (PROVENTIL) (2.5 MG/3ML) 0.083% nebulizer solution, Take 3 mLs (2.5 mg total) by nebulization once as needed for wheezing or shortness of breath (or anaphylaxis due to Rituxan infusion)., Disp: 75 mL, Rfl: 12 .  Ascorbic Acid (VITAMIN C PO), Take 500 mg by mouth daily., Disp: , Rfl:  .  aspirin EC 81 MG tablet, Take 81 mg by mouth at bedtime. Swallow whole., Disp: , Rfl:  .  atorvastatin (LIPITOR) 20 MG tablet, Take 1 tablet (20 mg total) by mouth daily., Disp: , Rfl:  .  bisacodyl (DULCOLAX) 10 MG suppository, Place 10 mg rectally daily as needed for moderate constipation., Disp: , Rfl:  .  collagenase (SANTYL) ointment, Cleanse sacral area and then apply a layer of santyl with damp to dry dressing. Change daily and prn if soiled, Disp: 15 g, Rfl: 0 .  doxycycline (VIBRAMYCIN) 100 MG capsule, Take 100 mg by mouth 2 (two) times daily., Disp: , Rfl:  .  empagliflozin (JARDIANCE) 10 MG TABS tablet, Take 1 tablet (10 mg total) by mouth daily., Disp: 30 tablet, Rfl:  .  enoxaparin (LOVENOX) 40 MG/0.4ML injection, Inject 40 mg into the skin daily., Disp: , Rfl:  .  fenofibrate 160 MG tablet, Take 160 mg by mouth at bedtime. , Disp: , Rfl:  .  fluticasone (FLONASE) 50 MCG/ACT nasal spray, Place 2 sprays into both nostrils 2 (two) times daily., Disp: , Rfl: 2 .  gabapentin (NEURONTIN) 100 MG capsule, Take 200 mg by mouth at bedtime., Disp: , Rfl:  .  Icosapent Ethyl (VASCEPA) 1 g CAPS, Take 2 capsules (2 g total) by mouth 2 (two) times daily., Disp: 120 capsule, Rfl: 3 .  insulin aspart (NOVOLOG) 100 UNIT/ML injection, Inject into the skin 2 (two) times daily between meals. Per sliding scale, Disp: , Rfl:  .  insulin glargine (LANTUS)  100 unit/mL SOPN, Inject 0.3 mLs (30 Units total) into the skin at bedtime., Disp: 15 mL, Rfl: 11 .  lidocaine (LIDODERM) 5 %, Place 1 patch onto the skin daily. Apply to right hip at 8 am and remove at 8 pm daily., Disp: 30 patch, Rfl: 0 .  lisinopril (ZESTRIL) 2.5 MG tablet, Take 2.5 mg by mouth daily., Disp: , Rfl:  .  Menthol, Topical Analgesic, (BIOFREEZE) 4 % GEL, Apply 1 application topically daily as needed., Disp: , Rfl:  .  Multiple Vitamin (MULTIVITAMIN WITH MINERALS) TABS tablet, Take 1 tablet by mouth daily. Centrum Silver Men's, Disp: , Rfl:  .  nystatin (MYCOSTATIN/NYSTOP) powder, Apply 1 application topically 3 (three) times daily., Disp: 15 g, Rfl: 0 .  oxybutynin (DITROPAN) 5 MG tablet, Take 5 mg by mouth 2 (two) times daily. , Disp: , Rfl:  .  pantoprazole (PROTONIX) 40 MG tablet, Take 1 tablet (40 mg total) by mouth daily., Disp: , Rfl:  .  Pollen Extracts (  PROSTAT PO), Take 30 mLs by mouth in the morning and at bedtime., Disp: , Rfl:  .  saccharomyces boulardii (FLORASTOR) 250 MG capsule, Take 1 capsule (250 mg total) by mouth 2 (two) times daily., Disp: , Rfl:  .  sertraline (ZOLOFT) 100 MG tablet, Take 150 mg by mouth at bedtime. , Disp: , Rfl:  .  tamsulosin (FLOMAX) 0.4 MG CAPS capsule, Take 1 capsule (0.4 mg total) by mouth daily after supper., Disp: 30 capsule, Rfl:  .  traMADol (ULTRAM) 50 MG tablet, Take 1 tablet (50 mg total) by mouth every 8 (eight) hours as needed for severe pain., Disp: 5 tablet, Rfl: 0 .  vitamin B-12 (CYANOCOBALAMIN) 500 MCG tablet, Take 500 mcg by mouth daily., Disp: , Rfl:  .  zinc sulfate 220 (50 Zn) MG capsule, Take 220 mg by mouth daily., Disp: , Rfl:   PAST MEDICAL HISTORY: Past Medical History:  Diagnosis Date  . Allergy   . CAD (coronary artery disease)    a. angioplasty of his RCA in 1990. b. bare metal stent placed in the RCA in 2000 followed by rotational atherectomy shortly after for stent restenosis. c. last cath was in 2012  showing stable moderate diffuse CAD. (70% mid LAD, 80% diagonal, 70% Ramus, 40% mid to distal RCA stent restenosis). d. Low risk nuc in 2015.  . Diabetes mellitus   . Diverticulosis   . Elevated CK   . Erectile dysfunction   . Hemorrhoids   . HTN (hypertension)   . Hyperlipidemia   . Hypertriglyceridemia   . Malignant melanoma of left side of neck ([place]) 10/25/2018  . Myocardial infarction ([place])   . Obesity   . OSA (obstructive sleep apnea)   . Persistent disorder of initiating or maintaining sleep   . Personal history of colonic polyps 02/05/2003    PAST SURGICAL HISTORY: Past Surgical History:  Procedure Laterality Date  . CHOLECYSTECTOMY    . CORONARY ANGIOPLASTY    . CORONARY STENT PLACEMENT     stenting of the right coronary artery with followup rotational  atherectomy. (3 stents placed)  . FINGER SURGERY     right  . FOOT SURGERY     right  . INGUINAL HERNIA REPAIR     right  . melanoma removal     neck  . ORTHOPEDIC SURGERY     foot right  . rotator cuff surg     Bil    FAMILY HISTORY: Family History  Problem Relation Age of Onset  . Liver cancer Father   . Lung cancer Father   . Heart disease Father   . Heart disease Sister   . Lung cancer Mother   . COPD Mother   . Obesity Mother   . Colon cancer Neg Hx   . Stomach cancer Neg Hx   . Pancreatic cancer Neg Hx     SOCIAL HISTORY:  Social History   Socioeconomic History  . Marital status: Married    Spouse name: Not on file  . Number of children: 1  . Years of education: Not on file  . Highest education level: Not on file  Occupational History  . Occupation: Teacher, English as a foreign language    Comment: [employer]  . Smoking status: Former Smoker    Packs/day: 1.50    Years: 30.00    Pack years: 45.00    Types: Cigarettes    Quit date: 09/19/1980    Years since quitting: 39.6  . Smokeless tobacco: Never Used  Vaping Use  . Vaping Use: Never used  Substance and Sexual Activity  . Alcohol use: Not  Currently    Alcohol/week: 7.0 - 14.0 standard drinks    Types: 7 - 14 Shots of liquor per week  . Drug use: No  . Sexual activity: Not on file  Other Topics Concern  . Not on file  Social History Narrative   12/14/2018: Lives with wife and 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 (son had passed away several years ago)   Recevies much of his care through [place]   Enjoys golfing   Social Determinants of Health   Financial Resource Strain: Low Risk   . Difficulty of Paying Living Expenses: Not hard at all  Food Insecurity:   . Worried About Charity fundraiser in the Last Year:   . Arboriculturist in the Last Year:   Transportation Needs: No Transportation Needs  . Lack of Transportation (Medical): No  . Lack of Transportation (Non-Medical): No  Physical Activity:   . Days of Exercise per Week:   . Minutes of Exercise per Session:   Stress:   . Feeling of Stress :   Social Connections:   . Frequency of Communication with Friends and Family:   . Frequency of Social Gatherings with Friends and Family:   . Attends Religious Services:   . Active Member of Clubs or Organizations:   . Attends Archivist Meetings:   Marland Kitchen Marital Status:   Intimate Partner Violence:   . Fear of Current or Ex-Partner:   . Emotionally Abused:   Marland Kitchen Physically Abused:   . Sexually Abused:      PHYSICAL EXAM  Vitals:   05/06/20 0848  BP: (!) 103/56  Pulse: 74  SpO2: 94%  Height: 6' (1.829 m)    Body mass index is 32.41 kg/m.   General: The patient is well-developed and well-nourished and in no acute distress  HEENT:  Head is [place]/AT.  Sclera are anicteric.  Funduscopic exam shows normal optic discs and retinal vessels.  Neck: No carotid bruits are noted.  The neck is nontender.  Cardiovascular: The heart has a regular rate and rhythm with a normal S1 and S2. There were no murmurs, gallops or rubs.    Skin: Extremities show mild edema at the ankles.  No rashes.  Neurologic Exam  Mental status: The  patient is alert and oriented x 3 at the time of the examination. The patient has apparent normal recent and remote memory, with an apparently normal attention span and concentration ability.   Speech is normal.  Cranial nerves: Extraocular movements are full. Pupils are equal, round, and reactive to light and accomodation.  Visual fields are full.  Facial symmetry is present. There is good facial sensation to soft touch bilaterally.Facial strength is normal.  Trapezius and sternocleidomastoid strength is normal. No dysarthria is noted.  The tongue is midline, and the patient has symmetric elevation of the soft palate. No obvious hearing deficits are noted.  Motor:  Muscle bulk is normal.   In the right arm, strength was 5/5 in the deltoid and biceps, 4/5 in the triceps, 3/5 in finger flexors and extensors and 0-1 in the intrinsic hand muscles.  In the left arm, strength was 4/5 in the biceps, 2/5 in the triceps, 2/5 in finger flexors and 0-1 in finger extensors and intrinsic hand muscles.  Strength was 0/5 in both legs.  Sensory: In the legs, he has reduced sensation to temperature but  more normal sensation to touch.  He has fairly normal sensation to vibration.  Coordination: Cerebellar testing reveals mildly reduced finger-nose-finger.  He could not do heel-to-shin.  Gait and station: He is unable to stand  Reflexes: Deep tendon reflexes are 3+ at the knees and 2+ at the ankles.  The plantar responses were extensor.    DIAGNOSTIC DATA (LABS, IMAGING, TESTING) - I reviewed patient records, labs, notes, testing and imaging myself where available.  Lab Results  Component Value Date   WBC 5.2 04/22/2020   HGB 11.6 (L) 04/22/2020   HCT 35.9 (L) 04/22/2020   MCV 87.8 04/22/2020   PLT 270 04/22/2020      Component Value Date/Time   NA 136 04/22/2020 0551   NA 140 04/11/2019 1028   K 4.3 04/22/2020 0551   CL 104 04/22/2020 0551   CO2 22 04/22/2020 0551   GLUCOSE 128 (H) 04/22/2020 0551     BUN 23 04/22/2020 0551   BUN 21 04/11/2019 1028   CREATININE 0.82 04/22/2020 0551   CREATININE 0.87 04/15/2016 1133   CALCIUM 9.3 04/22/2020 0551   PROT 5.9 (L) 04/22/2020 0551   PROT 6.6 01/20/2020 0754   ALBUMIN 2.6 (L) 04/22/2020 0551   ALBUMIN 4.2 03/10/2020 1757   AST 45 (H) 04/22/2020 0551   ALT 42 04/22/2020 0551   ALKPHOS 51 04/22/2020 0551   BILITOT 0.3 04/22/2020 0551   BILITOT 0.3 01/20/2020 0754   GFRNONAA >60 04/22/2020 0551   GFRNONAA 87 04/15/2016 1133   GFRAA >60 04/22/2020 0551   GFRAA >89 04/15/2016 1133   Lab Results  Component Value Date   CHOL 135 01/20/2020   HDL 23 (L) 01/20/2020   LDLCALC 65 01/20/2020   LDLDIRECT 68 01/20/2020   TRIG 295 (H) 01/20/2020   CHOLHDL 5.9 (H) 01/20/2020   Lab Results  Component Value Date   HGBA1C 6.7 (H) 03/09/2020   Lab Results  Component Value Date   XBLTJQZE09 233 03/15/2020   Lab Results  Component Value Date   TSH 2.300 03/15/2020       ASSESSMENT AND PLAN  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devic) (HCC) - Plan: Neuromyelitis optica autoab, IgG, Pan-ANCA, CD19 and CD20, Flow Cytometry  High risk medication use - Plan: Neuromyelitis optica autoab, IgG, Pan-ANCA, CD19 and CD20, Flow Cytometry  Neurogenic bladder  Neurogenic bowel  Quadriplegia (HCC)    In summary, Mr. Mccrone is a 74 year old man who presented with a rapidly progressive spinal cord syndrome and was found to have transverse myelitis.  Testing showed  NMO antibodies consistent with neuromyelitis optica.  He received steroids, IVIG and Rituxan.  He had a very mild improvement in the arms, mostly at the C6 myotomes but no improvement in his legs.  He was started on Rituxan and he tolerated it well.  Because of the aggressiveness of his NMO SD, I recommend that he continue Rituxan therapy.  We will plan on his next Rituxan towards the end of the year.  I will want to see him back a couple weeks before that to retest the NMO antibody and the CD19/CD20  and check IgG/IgM levels.  Fortunately, he had the COVID-19 vaccination in January and February.  I discussed with him and his wife that Rituxan can blunt the vaccination response.  I do not think that the vaccination had anything to do with his transverse myelitis due to the length of time (about 5 months) between the vaccination and the onset of symptoms.  He should call sooner if  there are new or worsening neurologic symptoms.  Thank you for asking me to see Mr. Trulson.  Please let me know if I can be of further assistance with him or other patients in the future.   Naquita Nappier A. Felecia Shelling, MD, [facility] 01/27/210, 1:73 PM Certified in Neurology, Clinical Neurophysiology, Sleep Medicine and Neuroimaging  [facility] [address] [place], [place] [address] [phone]

## 2020-05-06 NOTE — Progress Notes (Signed)
Faxed signed report to [facility] that included the following orders from Dr. Felecia Shelling: "We will schedule Rituxan infusion late December, increase gabapentin to 100mg  q am, 100mg  q afternoon, 300mg  qhs, Return for f/u in 3 months (scheduled for 08/20/20 at 1000am with Dr Felecia Shelling". Fax: [phone]. Received fax confirmation.

## 2020-05-07 DIAGNOSIS — E118 Type 2 diabetes mellitus with unspecified complications: Secondary | ICD-10-CM | POA: Diagnosis not present

## 2020-05-07 DIAGNOSIS — G373 Acute transverse myelitis in demyelinating disease of central nervous system: Secondary | ICD-10-CM | POA: Diagnosis not present

## 2020-05-07 DIAGNOSIS — N319 Neuromuscular dysfunction of bladder, unspecified: Secondary | ICD-10-CM | POA: Diagnosis not present

## 2020-05-07 DIAGNOSIS — G8250 Quadriplegia, unspecified: Secondary | ICD-10-CM | POA: Diagnosis not present

## 2020-05-09 LAB — PAN-ANCA
ANCA Proteinase 3: <3.5 U/mL (ref 0.0–3.5)
Atypical pANCA: <1:20 {titer}
C-ANCA: <1:20 {titer}
Myeloperoxidase Ab: <9 U/mL (ref 0.0–9.0)
P-ANCA: <1:20 {titer}

## 2020-05-09 LAB — NEUROMYELITIS OPTICA AUTOAB, IGG: NMO IgG Autoantibodies: 5 U/mL — ABNORMAL HIGH (ref 0.0–3.0)

## 2020-05-09 LAB — CD19 AND CD20, FLOW CYTOMETRY

## 2020-05-11 ENCOUNTER — Other Ambulatory Visit: Payer: Self-pay | Admitting: *Deleted

## 2020-05-11 ENCOUNTER — Telehealth: Payer: Self-pay | Admitting: *Deleted

## 2020-05-11 DIAGNOSIS — L89310 Pressure ulcer of right buttock, unstageable: Secondary | ICD-10-CM | POA: Diagnosis not present

## 2020-05-11 DIAGNOSIS — F411 Generalized anxiety disorder: Secondary | ICD-10-CM | POA: Diagnosis not present

## 2020-05-11 DIAGNOSIS — F331 Major depressive disorder, recurrent, moderate: Secondary | ICD-10-CM | POA: Diagnosis not present

## 2020-05-11 DIAGNOSIS — L89156 Pressure-induced deep tissue damage of sacral region: Secondary | ICD-10-CM | POA: Diagnosis not present

## 2020-05-11 NOTE — Telephone Encounter (Addendum)
Gave completed/signed Rituxan order (1,000mg  IV on day 1 and day 15 every 24 weeks) to intrafusion to start processing for pt/get insurance approval. Pt due next around end of December , he is already on this. Sent copy of order to be scanned to epic. Pre meds: tylenol 650mg  po 30 min prior to infusion, benadryl 25 mg po 30 min prior, solumedrol 60mg  IV 30 min prior to infusion  Pt has a follow up scheduled for 08/20/20 at 10am with Dr. Felecia Shelling already.

## 2020-05-11 NOTE — Patient Outreach (Signed)
[facility] Post- Acute Care Coordinator follow up.   Update received from [facility] reporting member has wound. Hospice has been recommended. Unsure of what spouse will decide regarding disposition plans. Care plan meeting schedule for this Thursday.  Will continue to follow for transition plans while member remains in SNF.    Brent Rolling, MSN-Ed, RN,BSN [place] Acute Care Coordinator [phone] [facility]) [phone]  (Toll free office)

## 2020-05-12 DIAGNOSIS — R110 Nausea: Secondary | ICD-10-CM | POA: Diagnosis not present

## 2020-05-12 DIAGNOSIS — L89154 Pressure ulcer of sacral region, stage 4: Secondary | ICD-10-CM | POA: Diagnosis not present

## 2020-05-12 DIAGNOSIS — R159 Full incontinence of feces: Secondary | ICD-10-CM | POA: Diagnosis not present

## 2020-05-15 DIAGNOSIS — R6883 Chills (without fever): Secondary | ICD-10-CM | POA: Diagnosis not present

## 2020-05-15 DIAGNOSIS — Z9189 Other specified personal risk factors, not elsewhere classified: Secondary | ICD-10-CM | POA: Diagnosis not present

## 2020-05-15 DIAGNOSIS — M75101 Unspecified rotator cuff tear or rupture of right shoulder, not specified as traumatic: Secondary | ICD-10-CM | POA: Diagnosis not present

## 2020-05-18 DIAGNOSIS — I10 Essential (primary) hypertension: Secondary | ICD-10-CM | POA: Diagnosis not present

## 2020-05-18 DIAGNOSIS — R6883 Chills (without fever): Secondary | ICD-10-CM | POA: Diagnosis not present

## 2020-05-18 DIAGNOSIS — B370 Candidal stomatitis: Secondary | ICD-10-CM | POA: Diagnosis not present

## 2020-05-18 DIAGNOSIS — M549 Dorsalgia, unspecified: Secondary | ICD-10-CM | POA: Diagnosis not present

## 2020-05-19 DIAGNOSIS — G373 Acute transverse myelitis in demyelinating disease of central nervous system: Secondary | ICD-10-CM | POA: Diagnosis not present

## 2020-05-19 DIAGNOSIS — E08622 Diabetes mellitus due to underlying condition with other skin ulcer: Secondary | ICD-10-CM | POA: Diagnosis not present

## 2020-05-19 DIAGNOSIS — L89154 Pressure ulcer of sacral region, stage 4: Secondary | ICD-10-CM | POA: Diagnosis not present

## 2020-05-19 DIAGNOSIS — Z794 Long term (current) use of insulin: Secondary | ICD-10-CM | POA: Diagnosis not present

## 2020-05-20 ENCOUNTER — Telehealth: Payer: Self-pay | Admitting: *Deleted

## 2020-05-20 DIAGNOSIS — G360 Neuromyelitis optica [Devic]: Secondary | ICD-10-CM

## 2020-05-20 DIAGNOSIS — Z79899 Other long term (current) drug therapy: Secondary | ICD-10-CM

## 2020-05-20 DIAGNOSIS — Z114 Encounter for screening for human immunodeficiency virus [HIV]: Secondary | ICD-10-CM

## 2020-05-20 NOTE — Telephone Encounter (Signed)
Dr. Felecia Shelling would like to get labs prior to infusion that is due end of December. He has a f/u 08/20/20 and can complete then. I called wife and reminded her about appt on 08/20/20 at 10am with Dr. Felecia Shelling. Expressed importance of keeping this appt to make sure he gets labs to clear him prior to next infusion. She verbalized understanding. She will let his care facility know.

## 2020-05-21 NOTE — Telephone Encounter (Signed)
Called wife back to inform her Brent [name] has outside lab company: [facility] that can come complete labs. I faxed over orders to [facility] and they will have [facility] complete. She verbalized understanding and appreciation.

## 2020-05-21 NOTE — Telephone Encounter (Signed)
Called wife back. We spoke with Graylon Gunning, RN in the infusion suite and labs need to be done now so she can submit to get Rituxan infusion authorized prior to when he is due end of December. Wife unsure if below labs were completed. May have been done at [facility] in [place] but she has never dealt with them, it was always her husband. Wife states pt at [facility] and [facility] located at [address], [place], [place] [address] [phone]) [phone]. She is unsure if they can do bloodwork. Aware that if they cannot, we can set up a time for him to come to our office. I will call back to let her know plan after I speak with [facility].   Dr. Felecia Shelling requests the following labs: Hep B sAg, Hep B SAb, Heb B cAb, HIV and Quant TB.  I called [facility]. They have outside lab come in to draw labs on patient's there. Company: [facility]. We can fax order to them at [phone] and they will have [facility] complete.  Orders placed in epic, printed, signed by MD. Virgel Gess signed orders to [facility] and received confirmation. Requesting results be faxed to [place] at [phone] attn: Dr. Felecia Shelling.

## 2020-05-21 NOTE — Addendum Note (Signed)
Addended by: Roberts Gaudy L on: 05/21/2020 10:14 AM   Modules accepted: Orders

## 2020-05-25 ENCOUNTER — Emergency Department (HOSPITAL_COMMUNITY)
Admission: EM | Admit: 2020-05-25 | Discharge: 2020-05-25 | Disposition: A | Discharge status: Home / Self Care | Payer: Medicare Other | Attending: Emergency Medicine | Admitting: Emergency Medicine

## 2020-05-25 ENCOUNTER — Other Ambulatory Visit: Payer: Self-pay

## 2020-05-25 ENCOUNTER — Emergency Department (HOSPITAL_COMMUNITY): Payer: Medicare Other

## 2020-05-25 DIAGNOSIS — R682 Dry mouth, unspecified: Secondary | ICD-10-CM | POA: Diagnosis not present

## 2020-05-25 DIAGNOSIS — J029 Acute pharyngitis, unspecified: Secondary | ICD-10-CM | POA: Diagnosis not present

## 2020-05-25 DIAGNOSIS — Z87891 Personal history of nicotine dependence: Secondary | ICD-10-CM | POA: Insufficient documentation

## 2020-05-25 DIAGNOSIS — I119 Hypertensive heart disease without heart failure: Secondary | ICD-10-CM | POA: Insufficient documentation

## 2020-05-25 DIAGNOSIS — M47812 Spondylosis without myelopathy or radiculopathy, cervical region: Secondary | ICD-10-CM | POA: Diagnosis not present

## 2020-05-25 DIAGNOSIS — E860 Dehydration: Secondary | ICD-10-CM | POA: Diagnosis not present

## 2020-05-25 DIAGNOSIS — G373 Acute transverse myelitis in demyelinating disease of central nervous system: Secondary | ICD-10-CM

## 2020-05-25 DIAGNOSIS — I6529 Occlusion and stenosis of unspecified carotid artery: Secondary | ICD-10-CM | POA: Diagnosis not present

## 2020-05-25 DIAGNOSIS — E119 Type 2 diabetes mellitus without complications: Secondary | ICD-10-CM | POA: Diagnosis not present

## 2020-05-25 DIAGNOSIS — R1310 Dysphagia, unspecified: Secondary | ICD-10-CM | POA: Diagnosis not present

## 2020-05-25 DIAGNOSIS — G9589 Other specified diseases of spinal cord: Secondary | ICD-10-CM | POA: Diagnosis not present

## 2020-05-25 DIAGNOSIS — R531 Weakness: Secondary | ICD-10-CM | POA: Diagnosis not present

## 2020-05-25 DIAGNOSIS — Z20822 Contact with and (suspected) exposure to covid-19: Secondary | ICD-10-CM | POA: Insufficient documentation

## 2020-05-25 DIAGNOSIS — Z85820 Personal history of malignant melanoma of skin: Secondary | ICD-10-CM | POA: Diagnosis not present

## 2020-05-25 DIAGNOSIS — J392 Other diseases of pharynx: Secondary | ICD-10-CM | POA: Diagnosis not present

## 2020-05-25 DIAGNOSIS — J811 Chronic pulmonary edema: Secondary | ICD-10-CM | POA: Diagnosis not present

## 2020-05-25 DIAGNOSIS — Z7982 Long term (current) use of aspirin: Secondary | ICD-10-CM | POA: Insufficient documentation

## 2020-05-25 DIAGNOSIS — Z79899 Other long term (current) drug therapy: Secondary | ICD-10-CM | POA: Insufficient documentation

## 2020-05-25 DIAGNOSIS — Z794 Long term (current) use of insulin: Secondary | ICD-10-CM | POA: Insufficient documentation

## 2020-05-25 LAB — CBC WITH DIFFERENTIAL/PLATELET
Abs Immature Granulocytes: 0.18 10*3/uL — ABNORMAL HIGH (ref 0.00–0.07)
Basophils Absolute: 0 10*3/uL (ref 0.0–0.1)
Basophils Relative: 0 %
Eosinophils Absolute: 0 10*3/uL (ref 0.0–0.5)
Eosinophils Relative: 0 %
HCT: 34.9 % — ABNORMAL LOW (ref 39.0–52.0)
Hemoglobin: 10.8 g/dL — ABNORMAL LOW (ref 13.0–17.0)
Immature Granulocytes: 1 %
Lymphocytes Relative: 6 %
Lymphs Abs: 1 10*3/uL (ref 0.7–4.0)
MCH: 26.3 pg (ref 26.0–34.0)
MCHC: 30.9 g/dL (ref 30.0–36.0)
MCV: 84.9 fL (ref 80.0–100.0)
Monocytes Absolute: 0.7 10*3/uL (ref 0.1–1.0)
Monocytes Relative: 5 %
Neutro Abs: 13.2 10*3/uL — ABNORMAL HIGH (ref 1.7–7.7)
Neutrophils Relative %: 88 %
Platelets: 464 10*3/uL — ABNORMAL HIGH (ref 150–400)
RBC: 4.11 MIL/uL — ABNORMAL LOW (ref 4.22–5.81)
RDW: 13.5 % (ref 11.5–15.5)
WBC: 15.1 10*3/uL — ABNORMAL HIGH (ref 4.0–10.5)
nRBC: 0 % (ref 0.0–0.2)

## 2020-05-25 LAB — BASIC METABOLIC PANEL
Anion gap: 14 (ref 5–15)
BUN: 13 mg/dL (ref 8–23)
CO2: 19 mmol/L — ABNORMAL LOW (ref 22–32)
Calcium: 9.8 mg/dL (ref 8.9–10.3)
Chloride: 99 mmol/L (ref 98–111)
Creatinine, Ser: 0.47 mg/dL — ABNORMAL LOW (ref 0.61–1.24)
GFR calc Af Amer: >60 mL/min (ref >60)
GFR calc non Af Amer: >60 mL/min (ref >60)
Glucose, Bld: 100 mg/dL — ABNORMAL HIGH (ref 70–99)
Potassium: 4.2 mmol/L (ref 3.5–5.1)
Sodium: 132 mmol/L — ABNORMAL LOW (ref 135–145)

## 2020-05-25 LAB — URINALYSIS, ROUTINE W REFLEX MICROSCOPIC
Bacteria, UA: NONE SEEN
Bilirubin Urine: NEGATIVE
Glucose, UA: >=500 mg/dL — AB
Hgb urine dipstick: NEGATIVE
Ketones, ur: 20 mg/dL — AB
Leukocytes,Ua: NEGATIVE
Nitrite: NEGATIVE
Protein, ur: NEGATIVE mg/dL
Specific Gravity, Urine: >1.046 — ABNORMAL HIGH (ref 1.005–1.030)
pH: 5 (ref 5.0–8.0)

## 2020-05-25 LAB — CBG MONITORING, ED
Glucose-Capillary: 110 mg/dL — ABNORMAL HIGH (ref 70–99)
Glucose-Capillary: 75 mg/dL (ref 70–99)

## 2020-05-25 LAB — GROUP A STREP BY PCR: Group A Strep by PCR: NOT DETECTED

## 2020-05-25 LAB — SARS CORONAVIRUS 2 BY RT PCR (HOSPITAL ORDER, PERFORMED IN ~~LOC~~ HOSPITAL LAB): SARS Coronavirus 2: NEGATIVE

## 2020-05-25 MED ORDER — ONDANSETRON HCL 4 MG/2ML IJ SOLN
4.0000 mg | Freq: Once | INTRAMUSCULAR | Status: AC
  Administered 2020-05-25: 4 mg via INTRAVENOUS
  Filled 2020-05-25: qty 2

## 2020-05-25 MED ORDER — LORAZEPAM 2 MG/ML IJ SOLN
0.5000 mg | Freq: Once | INTRAMUSCULAR | Status: AC
  Administered 2020-05-25: 0.5 mg via INTRAVENOUS
  Filled 2020-05-25: qty 1

## 2020-05-25 MED ORDER — SODIUM CHLORIDE 0.9 % IV BOLUS
500.0000 mL | Freq: Once | INTRAVENOUS | Status: AC
  Administered 2020-05-25: 500 mL via INTRAVENOUS

## 2020-05-25 MED ORDER — LORAZEPAM 2 MG/ML IJ SOLN: 1.0000 mg | Freq: Once | INTRAMUSCULAR | Status: DC

## 2020-05-25 MED ORDER — IOHEXOL 300 MG/ML  SOLN
75.0000 mL | Freq: Once | INTRAMUSCULAR | Status: AC | PRN
  Administered 2020-05-25: 75 mL via INTRAVENOUS

## 2020-05-25 MED ORDER — GADOBUTROL 1 MMOL/ML IV SOLN
7.0000 mL | Freq: Once | INTRAVENOUS | Status: AC | PRN
  Administered 2020-05-25: 7 mL via INTRAVENOUS

## 2020-05-25 MED ORDER — BACITRACIN ZINC 500 UNIT/GM EX OINT
TOPICAL_OINTMENT | Freq: Two times a day (BID) | CUTANEOUS | Status: DC
  Administered 2020-05-25: 1 via TOPICAL

## 2020-05-25 NOTE — ED Notes (Signed)
Called pt for reassess vitals no answer X1

## 2020-05-25 NOTE — ED Notes (Signed)
Patient transported to CT 

## 2020-05-25 NOTE — ED Triage Notes (Signed)
Pt here from [facility] for sore throat x 2-3 days. EMS was called this morning for dysphagia; however, pt able to swallow well. Just sts it is painful. White patches noted to throat.

## 2020-05-25 NOTE — ED Notes (Signed)
Pt drinking  Ice water. tol well.

## 2020-05-25 NOTE — ED Notes (Signed)
[facility] called to transport pt 

## 2020-05-25 NOTE — Discharge Instructions (Signed)
Your labwork and imaging were all very reassuring. I suspect your dry mouth/sore throat/difficulty swallowing is related to excessive dryness in your mouth that is causing you to not want to eat or drink. You have successfully been able to drink water here without issue.  [place] should allow you to have water at your bedside at all times Follow up with your PCP regarding your ED visit today Return to the ED for any worsening symptoms

## 2020-05-25 NOTE — ED Notes (Signed)
Patient transported to MRI 

## 2020-05-25 NOTE — ED Notes (Signed)
PT CBG 75 ,apple juice given to pt.

## 2020-05-25 NOTE — ED Notes (Signed)
Pt drank 2 cups of ice water

## 2020-05-25 NOTE — ED Notes (Signed)
Called pt for vitals no answer X2

## 2020-05-25 NOTE — ED Notes (Signed)
Pt arrived with 16 foley from facility.

## 2020-05-25 NOTE — ED Provider Notes (Signed)
[facility] EMERGENCY DEPARTMENT Provider Note   CSN: [id number] Arrival date & time: 05/25/20  3846     History Chief Complaint  Patient presents with  . Sore Throat    Brent Bursch. is a 74 y.o. male with PMHx CAD s/p stent, DM, HTN, HLD, transverse myelitis, quadriplegia who presents to the ED today via EMS for sore throat. Per triage report pt is from [facility]; he has been having a sore throat for 2-3 days. EMS was called this morning for apparent dysphagia however they report in triage that pt is swallowing well and states it is painful. They reported white patches noted to throat.   Pt states that he has been having a sore throat on and off for "awhile." They have been treating it with magic mouthwash however he woke up this morning and felt like he could not swallow at all. He states they gave him something to drink at [facility] and it came back up. Pt reports he has also had a lack of appetite over the past couple of days and did not want to eat anything yesterday however did not have any trouble swallowing yesterday; just some pain. Pt denies any fevers or chills. He believes he is vaccinated against COVID 19.   Additional information attempted to be obtained by nursing staff at [facility] however unsuccessful.   The history is provided by the patient and medical records.       Past Medical History:  Diagnosis Date  . Allergy   . CAD (coronary artery disease)    a. angioplasty of his RCA in 1990. b. bare metal stent placed in the RCA in 2000 followed by rotational atherectomy shortly after for stent restenosis. c. last cath was in 2012 showing stable moderate diffuse CAD. (70% mid LAD, 80% diagonal, 70% Ramus, 40% mid to distal RCA stent restenosis). d. Low risk nuc in 2015.  . Diabetes mellitus   . Diverticulosis   . Elevated CK   . Erectile dysfunction   . Hemorrhoids   . HTN (hypertension)   . Hyperlipidemia   . Hypertriglyceridemia     . Malignant melanoma of left side of neck ([place]) 10/25/2018  . Myocardial infarction ([place])   . Obesity   . OSA (obstructive sleep apnea)   . Persistent disorder of initiating or maintaining sleep   . Personal history of colonic polyps 02/05/2003    Patient Active Problem List   Diagnosis Date Noted  . Abdominal pain   . E. coli UTI   . Pressure injury of skin 04/09/2020  . 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 Transverse myelitis ([place])   . Labile blood pressure   . Labile blood glucose   . Neurogenic bladder   . Neurogenic bowel   . Acute blood loss anemia   . Transaminitis   . Controlled type 2 diabetes mellitus with hyperglycemia, with long-term current use of insulin ([place])   . Spinal cord infarction ([place]) 03/25/2020  . Quadriplegia and quadriparesis ([place])   . Dyslipidemia   . HCAP (healthcare-associated pneumonia)   . Acute on chronic respiratory failure with hypoxia ([place])   . Acute hypoxemic respiratory failure ([place])   . Quadriplegia ([place])   . Coronary artery disease involving native coronary artery of native heart without angina pectoris   . Steroid-induced hyperglycemia   . Diabetic peripheral neuropathy ([place])   . Tachypnea   . Hyponatremia   . AKI (acute kidney injury) ([place])   .  Weakness 03/09/2020  . Chest pain 03/08/2020  . Right leg numbness   . Malignant melanoma of left side of neck ([place]) 10/25/2018  . Facial neuritis 10/25/2018  . Myofascial pain syndrome 10/25/2018  . Occipital neuralgia of left side 10/25/2018  . CAP (community acquired pneumonia) 09/08/2017  . Viral URI with cough 08/02/2017  . Male hypogonadism 06/20/2016  . Renal lesion 06/20/2016  . Insomnia 08/07/2015  . Enlarged prostate with lower urinary tract symptoms (LUTS) 11/07/2014  . Balanitis 07/30/2012  . Bladder neck obstruction 07/30/2012  . Prostate nodule 06/18/2012  . Recurrent nephrolithiasis 06/18/2012  . Urinary urgency 06/18/2012  . Benign neoplasm of colon 08/29/2011  .  DEPRESSION, SITUATIONAL, ACUTE 01/23/2010  . Essential hypertension 05/30/2009  . Coronary atherosclerosis 05/30/2009  . Obstructive sleep apnea 11/28/2008  . OBESITY 09/23/2008  . ERECTILE DYSFUNCTION 11/26/2007  . Well controlled type 2 diabetes mellitus with peripheral circulatory disorder ([place]) 05/09/2007  . Hyperlipidemia 05/09/2007  . MYOCARDIAL INFARCTION, HX OF 05/09/2007  . DIVERTICULOSIS, COLON 05/09/2007    Past Surgical History:  Procedure Laterality Date  . CHOLECYSTECTOMY    . CORONARY ANGIOPLASTY    . CORONARY STENT PLACEMENT     stenting of the right coronary artery with followup rotational  atherectomy. (3 stents placed)  . FINGER SURGERY     right  . FOOT SURGERY     right  . INGUINAL HERNIA REPAIR     right  . melanoma removal     neck  . ORTHOPEDIC SURGERY     foot right  . rotator cuff surg     Bil       Family History  Problem Relation Age of Onset  . Liver cancer Father   . Lung cancer Father   . Heart disease Father   . Heart disease Sister   . Lung cancer Mother   . COPD Mother   . Obesity Mother   . Colon cancer Neg Hx   . Stomach cancer Neg Hx   . Pancreatic cancer Neg Hx     Social History   Tobacco Use  . Smoking status: Former Smoker    Packs/day: 1.50    Years: 30.00    Pack years: 45.00    Types: Cigarettes    Quit date: 09/19/1980    Years since quitting: 39.7  . Smokeless tobacco: Never Used  Vaping Use  . Vaping Use: Never used  Substance Use Topics  . Alcohol use: Not Currently    Alcohol/week: 7.0 - 14.0 standard drinks    Types: 7 - 14 Shots of liquor per week  . Drug use: No    Home Medications Prior to Admission medications   Medication Sig Start Date End Date Taking? Authorizing Provider  acetaminophen (TYLENOL) 325 MG tablet Take 2 tablets (650 mg total) by mouth every 6 (six) hours as needed for headache. 04/24/20   Love, Ivan Anchors, PA-C  albuterol (PROVENTIL) (2.5 MG/3ML) 0.083% nebulizer solution Take 3  mLs (2.5 mg total) by nebulization once as needed for wheezing or shortness of breath (or anaphylaxis due to Rituxan infusion). 04/24/20   Love, Ivan Anchors, PA-C  Ascorbic Acid (VITAMIN C PO) Take 500 mg by mouth daily.    [provider]  aspirin EC 81 MG tablet Take 81 mg by mouth at bedtime. Swallow whole.    [provider]  atorvastatin (LIPITOR) 20 MG tablet Take 1 tablet (20 mg total) by mouth daily. 03/26/20   Shelly Coss, MD  bisacodyl (DULCOLAX) 10 MG suppository Place 10 mg rectally daily as needed for moderate constipation.    [provider]  collagenase (SANTYL) ointment Cleanse sacral area and then apply a layer of santyl with damp to dry dressing. Change daily and prn if soiled 04/24/20   Love, Ivan Anchors, PA-C  doxycycline (VIBRAMYCIN) 100 MG capsule Take 100 mg by mouth 2 (two) times daily.    [provider]  empagliflozin (JARDIANCE) 10 MG TABS tablet Take 1 tablet (10 mg total) by mouth daily. 04/25/20   Love, Ivan Anchors, PA-C  enoxaparin (LOVENOX) 40 MG/0.4ML injection Inject 40 mg into the skin daily.    [provider]  fenofibrate 160 MG tablet Take 160 mg by mouth at bedtime.     [provider]  fluticasone (FLONASE) 50 MCG/ACT nasal spray Place 2 sprays into both nostrils 2 (two) times daily. 04/24/20   Love, Ivan Anchors, PA-C  gabapentin (NEURONTIN) 100 MG capsule Take 200 mg by mouth at bedtime.    [provider]  Icosapent Ethyl (VASCEPA) 1 g CAPS Take 2 capsules (2 g total) by mouth 2 (two) times daily. 07/19/19   Dunn, Nedra Hai, PA-C  insulin aspart (NOVOLOG) 100 UNIT/ML injection Inject into the skin 2 (two) times daily between meals. Per sliding scale    [provider]  insulin glargine (LANTUS) 100 unit/mL SOPN Inject 0.3 mLs (30 Units total) into the skin at bedtime. 03/25/20   Shelly Coss, MD  lidocaine (LIDODERM) 5 % Place 1 patch onto the skin daily. Apply to right hip at 8 am and remove at 8 pm daily.  04/24/20   Love, Ivan Anchors, PA-C  lisinopril (ZESTRIL) 2.5 MG tablet Take 2.5 mg by mouth daily.    [provider]  Menthol, Topical Analgesic, (BIOFREEZE) 4 % GEL Apply 1 application topically daily as needed.    [provider]  Multiple Vitamin (MULTIVITAMIN WITH MINERALS) TABS tablet Take 1 tablet by mouth daily. Centrum Silver Men's    [provider]  nystatin (MYCOSTATIN/NYSTOP) powder Apply 1 application topically 3 (three) times daily. 04/24/20   Love, Ivan Anchors, PA-C  oxybutynin (DITROPAN) 5 MG tablet Take 5 mg by mouth 2 (two) times daily.     [provider]  pantoprazole (PROTONIX) 40 MG tablet Take 1 tablet (40 mg total) by mouth daily. 03/26/20   Shelly Coss, MD  Pollen Extracts (PROSTAT PO) Take 30 mLs by mouth in the morning and at bedtime.    [provider]  riTUXimab (RITUXAN IV) Inject 1,000 mg into the vein. 1000mg  IV on day 1 and day 15 every 24 weeks    [provider]  saccharomyces boulardii (FLORASTOR) 250 MG capsule Take 1 capsule (250 mg total) by mouth 2 (two) times daily. 04/24/20   Love, Ivan Anchors, PA-C  sertraline (ZOLOFT) 100 MG tablet Take 150 mg by mouth at bedtime.     [provider]  tamsulosin (FLOMAX) 0.4 MG CAPS capsule Take 1 capsule (0.4 mg total) by mouth daily after supper. 04/24/20   Love, Ivan Anchors, PA-C  traMADol (ULTRAM) 50 MG tablet Take 1 tablet (50 mg total) by mouth every 8 (eight) hours as needed for severe pain. 04/24/20   Love, Ivan Anchors, PA-C  vitamin B-12 (CYANOCOBALAMIN) 500 MCG tablet Take 500 mcg by mouth daily.    [provider]  zinc sulfate 220 (50 Zn) MG capsule Take 220 mg by mouth daily.    [provider]  Allergies    Levofloxacin and Penicillins  Review of Systems   Review of Systems  Constitutional: Negative for chills and fever.  HENT: Positive for sore throat and trouble swallowing.   Respiratory: Negative for cough.   Gastrointestinal: Negative  for abdominal pain.  Neurological: Positive for weakness (generalized weakness in BUEs).  All other systems reviewed and are negative.   Physical Exam Updated Vital Signs BP (!) 120/41 (BP Location: Left Arm)   Pulse 90   Temp 99.9 F (37.7 C) (Oral)   Resp 14   SpO2 98%   Physical Exam Vitals and nursing note reviewed.  Constitutional:      Appearance: He is not ill-appearing or diaphoretic.     Comments: Frail elder male  HENT:     Head: Normocephalic and atraumatic.     Right Ear: Tympanic membrane normal.     Left Ear: Tympanic membrane normal.     Mouth/Throat:     Mouth: Mucous membranes are dry.     Comments: Entire oral mucosa and posterior oropharynx appears extremely dry and cracked with erythema. No obvious exudate appreciated.  Eyes:     Conjunctiva/sclera: Conjunctivae normal.  Cardiovascular:     Rate and Rhythm: Normal rate and regular rhythm.     Heart sounds: Normal heart sounds.  Pulmonary:     Effort: Pulmonary effort is normal.     Breath sounds: Normal breath sounds. No wheezing, rhonchi or rales.  Abdominal:     Palpations: Abdomen is soft.     Tenderness: There is no abdominal tenderness.  Musculoskeletal:     Cervical back: Neck supple.  Skin:    General: Skin is warm and dry.  Neurological:     Mental Status: He is alert.     ED Results / Procedures / Treatments   Labs (all labs ordered are listed, but only abnormal results are displayed) Labs Reviewed  BASIC METABOLIC PANEL - Abnormal; Notable for the following components:      Result Value   Sodium 132 (*)    CO2 19 (*)    Glucose, Bld 100 (*)    Creatinine, Ser 0.47 (*)    All other components within normal limits  CBC WITH DIFFERENTIAL/PLATELET - Abnormal; Notable for the following components:   WBC 15.1 (*)    RBC 4.11 (*)    Hemoglobin 10.8 (*)    HCT 34.9 (*)    Platelets 464 (*)    Neutro Abs 13.2 (*)    Abs Immature Granulocytes 0.18 (*)    All other components within  normal limits  URINALYSIS, ROUTINE W REFLEX MICROSCOPIC - Abnormal; Notable for the following components:   Specific Gravity, Urine >1.046 (*)    Glucose, UA >=500 (*)    Ketones, ur 20 (*)    All other components within normal limits  CBG MONITORING, ED - Abnormal; Notable for the following components:   Glucose-Capillary 110 (*)    All other components within normal limits  GROUP A STREP BY PCR  SARS CORONAVIRUS 2 BY RT PCR (HOSPITAL ORDER, [facility])  CBG MONITORING, ED    EKG None  Radiology CT Soft Tissue Neck W Contrast  Result Date: 05/25/2020 CLINICAL DATA:  Sore throat.  Odynophagia. EXAM: CT NECK WITH CONTRAST TECHNIQUE: Multidetector CT imaging of the neck was performed using the standard protocol following the bolus administration of intravenous contrast. CONTRAST:  12mL OMNIPAQUE IOHEXOL 300 MG/ML  SOLN COMPARISON:  Neck MRI 11/15/2018  FINDINGS: Pharynx and larynx: Mildly limited assessment due to metallic dental streak artifact and mild motion. Mild asymmetric thickening of the left-sided posterior and lateral oropharyngeal soft tissues without a discrete mass. No fluid collection or inflammatory changes in the parapharyngeal or retropharyngeal spaces. Widely patent airway. Salivary glands: No inflammation, mass, or stone. Thyroid: Unremarkable. Lymph nodes: No enlarged or suspicious lymph nodes in the neck. Vascular: Major vascular structures of the neck are patent with mild-to-moderate calcified plaque at the carotid bifurcations. No evidence of flow limiting stenosis. Limited intracranial: Unremarkable. Visualized orbits: Unremarkable. Mastoids and visualized paranasal sinuses: Clear. Skeleton: Mild cervical spondylosis and facet arthrosis. Upper chest: Clear lung apices. Other: Left posterolateral neck scarring with history of melanoma removal in this area. No mass or fluid collection. IMPRESSION: 1. Mild asymmetric thickening of the left-sided  oropharyngeal soft tissues without a discrete mass identified. Correlate with direct visualization. 2. No abscess or other acute abnormality identified. Electronically Signed   By: Logan Bores M.D.   On: 05/25/2020 15:38   MR CERVICAL SPINE W WO CONTRAST  Result Date: 05/25/2020 CLINICAL DATA:  Dysphagia and worsening weakness. History of transverse myelitis/neuromyelitis optica. EXAM: MRI CERVICAL SPINE WITHOUT AND WITH CONTRAST TECHNIQUE: Multiplanar and multiecho pulse sequences of the cervical spine, to include the craniocervical junction and cervicothoracic junction, were obtained without and with intravenous contrast. CONTRAST:  70mL GADAVIST GADOBUTROL 1 MMOL/ML IV SOLN COMPARISON:  03/16/2020 FINDINGS: The study is motion degraded despite repeated imaging attempts including severe motion on axial sequences. Alignment: Normal. Vertebrae: No fracture, suspicious osseous lesion, or significant marrow edema. Cord: There is residual T2 hyperintensity in the lower cervical and included upper thoracic spinal cord which has greatly decreased from the prior study. Cord expansion has resolved, and there is now cord volume loss which is most notable in the included thoracic spine. No residual cord enhancement is evident, and the upper cervical cord remains normal in signal. Posterior Fossa, vertebral arteries, paraspinal tissues: Mega cisterna magna. Preserved vertebral artery flow voids. Unremarkable paraspinal soft tissues. Disc levels: Similar appearance of mild cervical spondylosis and facet arthrosis compared to the prior MRI with detailed assessment limited by motion on axial images. No compressive spinal stenosis. IMPRESSION: Decreased signal abnormality in the lower cervical and included upper thoracic spinal cord with interval development of cord volume loss. No residual enhancement or acute finding. Electronically Signed   By: Logan Bores M.D.   On: 05/25/2020 19:09   DG Chest Port 1 View  Result  Date: 05/25/2020 CLINICAL DATA:  Sore throat, dysphagia. EXAM: PORTABLE CHEST 1 VIEW COMPARISON:  Chest x-rays dated 03/18/2020 and 08/08/2018 FINDINGS: Heart size and mediastinal contours are within normal limits. Coarse lung markings bilaterally suggesting chronic interstitial lung disease. No confluent opacity to suggest superimposed pneumonia or alveolar pulmonary edema. No pleural effusion or pneumothorax is seen. Osseous structures about the chest are unremarkable. IMPRESSION: 1. No active disease. No evidence of pneumonia or pulmonary edema. 2. Probable chronic interstitial lung disease. Electronically Signed   By: Franki Cabot M.D.   On: 05/25/2020 16:18    Procedures Procedures (including critical care time)  Medications Ordered in ED Medications  bacitracin ointment (1 application Topical Given 05/25/20 1744)  iohexol (OMNIPAQUE) 300 MG/ML solution 75 mL (75 mLs Intravenous Contrast Given 05/25/20 1447)  sodium chloride 0.9 % bolus 500 mL (0 mLs Intravenous Stopped 05/25/20 1753)  LORazepam (ATIVAN) injection 0.5 mg (0.5 mg Intravenous Given 05/25/20 1749)  ondansetron (ZOFRAN) injection 4 mg (4 mg Intravenous  Given 05/25/20 1713)  gadobutrol (GADAVIST) 1 MMOL/ML injection 7 mL (7 mLs Intravenous Contrast Given 05/25/20 1840)    ED Course  I have reviewed the triage vital signs and the nursing notes.  Pertinent labs & imaging results that were available during my care of the patient were reviewed by me and considered in my medical decision making (see chart for details).    MDM Rules/Calculators/A&P                          74 year old male with a history of transverse myelitis and quadriplegia who presents to the ED today with complaint of sore throat.  Reports that he had difficulty swallowing earlier today prompting the nursing staff at [facility] to call EMS.  It was noted in triage the patient was not having any difficulty swallowing and so he sat in the waiting room.  A strep test was  obtained which was negative.  Patient's vitals on arrival with a temp of 99.9.  Nontachycardic with a heart rate of 90.  Nontachypneic.  Patient is a frail elderly male however appears to be in no acute distress.  His entire oral mucosa including his lips, buccal mucosa, posterior oropharynx appear extremely dry with erythema and cracked skin.  Was given water while in the ED and drink the entire cup without difficulty.  He believes he is vaccinated against COVID-19 however unable to see this in her records.  Will test at this time.  Will obtain for lab work given patient's history.  We will plan for CTA soft tissue neck to ensure there is no posterior oropharyngeal deep space infection causing his symptoms today.  It does appear he has been dealing with a sore throat intermittently per patient and has been treating it with Magic mouthwash at [facility].  I have attempted to reach [facility] however was unsuccessful as they hung up on me.   COVID negative CBC with leukocytosis 15.1. Hgb 10.8 BMP with sodium 132. Glucose 100. Bicarb 19. Creatinine 0.47.   CT scan without acute findings. Pt does have some thickening of the left oropharyngeal soft tissues without mass. Pt has hx of malignant melanoma on left neck; likely this is what is seen on CT scan today. Airway is patent   Have added on CXR and U/A given leukocytosis to rule out infection CXR clear U/A without infection however increased specific gravity consistent with dehydration. This may be playing a part in pt's chronically dry mouth. Have ordered small amount of fluids.   Discussed with Dr. Rory Percy with neuro  given pts history of transverse myelitis with dysphagia today. He also reports worsening BUE weakness for the past couple of weeks - recommends MRI C spine with and without and if any acute findings to formally consult. If no findings and pt able to tolerate liquids here he can be discharged home.   MRI without acute abnormality. No  signs of worsening transverse myelitis.   There is no medical reason to admit patient at this time. He does report he does not feel like he gets water at [facility] as much as he would like. I have instructed on discharge instructions that patient should have water near his bedside at all times. I suspect this is playing a part in patient's symptoms/dry mouth/throat. He is stable for discharge home at this time.   This note was prepared using Dragon voice recognition software and may include unintentional dictation errors  due to the inherent limitations of voice recognition software.  Final Clinical Impression(s) / ED Diagnoses Final diagnoses:  Dehydration  Dry mouth  Transverse myelitis ([place])    Rx / DC Orders ED Discharge Orders    None       Discharge Instructions     Your labwork and imaging were all very reassuring. I suspect your dry mouth/sore throat/difficulty swallowing is related to excessive dryness in your mouth that is causing you to not want to eat or drink. You have successfully been able to drink water here without issue.  [place] should allow you to have water at your bedside at all times Follow up with your PCP regarding your ED visit today Return to the ED for any worsening symptoms       Brent Maize, PA-C 05/25/20 1930    Malvin Johns, MD 05/28/20 1711

## 2020-05-25 NOTE — ED Notes (Signed)
Pt CBG is not 110, notified Sarah(RN) about wrong pt labels scanned for CBG. POC edit sheet done and sent.

## 2020-05-26 DIAGNOSIS — E860 Dehydration: Secondary | ICD-10-CM | POA: Diagnosis not present

## 2020-05-26 DIAGNOSIS — R110 Nausea: Secondary | ICD-10-CM | POA: Diagnosis not present

## 2020-05-26 DIAGNOSIS — D72828 Other elevated white blood cell count: Secondary | ICD-10-CM | POA: Diagnosis not present

## 2020-05-26 DIAGNOSIS — E871 Hypo-osmolality and hyponatremia: Secondary | ICD-10-CM | POA: Diagnosis not present

## 2020-05-26 DIAGNOSIS — R1319 Other dysphagia: Secondary | ICD-10-CM | POA: Diagnosis not present

## 2020-05-26 DIAGNOSIS — G373 Acute transverse myelitis in demyelinating disease of central nervous system: Secondary | ICD-10-CM | POA: Diagnosis not present

## 2020-05-27 DIAGNOSIS — K1230 Oral mucositis (ulcerative), unspecified: Secondary | ICD-10-CM | POA: Diagnosis not present

## 2020-05-27 DIAGNOSIS — E860 Dehydration: Secondary | ICD-10-CM | POA: Diagnosis not present

## 2020-05-27 DIAGNOSIS — R1319 Other dysphagia: Secondary | ICD-10-CM | POA: Diagnosis not present

## 2020-05-27 DIAGNOSIS — D72828 Other elevated white blood cell count: Secondary | ICD-10-CM | POA: Diagnosis not present

## 2020-06-01 ENCOUNTER — Emergency Department (HOSPITAL_COMMUNITY): Payer: Medicare Other

## 2020-06-01 ENCOUNTER — Inpatient Hospital Stay (HOSPITAL_COMMUNITY): Payer: Medicare Other

## 2020-06-01 ENCOUNTER — Inpatient Hospital Stay (HOSPITAL_COMMUNITY)
Admission: EM | Admit: 2020-06-01 | Discharge: 2020-06-10 | DRG: 628 | Disposition: A | Discharge status: Skilled Nursing Facility | Payer: Medicare Other | Source: Skilled Nursing Facility | Attending: Internal Medicine | Admitting: Internal Medicine

## 2020-06-01 DIAGNOSIS — Z794 Long term (current) use of insulin: Secondary | ICD-10-CM | POA: Diagnosis not present

## 2020-06-01 DIAGNOSIS — Z20822 Contact with and (suspected) exposure to covid-19: Secondary | ICD-10-CM | POA: Diagnosis not present

## 2020-06-01 DIAGNOSIS — Z87891 Personal history of nicotine dependence: Secondary | ICD-10-CM

## 2020-06-01 DIAGNOSIS — Z955 Presence of coronary angioplasty implant and graft: Secondary | ICD-10-CM

## 2020-06-01 DIAGNOSIS — K660 Peritoneal adhesions (postprocedural) (postinfection): Secondary | ICD-10-CM | POA: Diagnosis not present

## 2020-06-01 DIAGNOSIS — R7881 Bacteremia: Secondary | ICD-10-CM | POA: Diagnosis present

## 2020-06-01 DIAGNOSIS — M4644 Discitis, unspecified, thoracic region: Secondary | ICD-10-CM | POA: Diagnosis not present

## 2020-06-01 DIAGNOSIS — K6812 Psoas muscle abscess: Secondary | ICD-10-CM | POA: Diagnosis present

## 2020-06-01 DIAGNOSIS — M4628 Osteomyelitis of vertebra, sacral and sacrococcygeal region: Secondary | ICD-10-CM | POA: Diagnosis not present

## 2020-06-01 DIAGNOSIS — F419 Anxiety disorder, unspecified: Secondary | ICD-10-CM | POA: Diagnosis not present

## 2020-06-01 DIAGNOSIS — E1151 Type 2 diabetes mellitus with diabetic peripheral angiopathy without gangrene: Secondary | ICD-10-CM | POA: Diagnosis not present

## 2020-06-01 DIAGNOSIS — L8990 Pressure ulcer of unspecified site, unspecified stage: Secondary | ICD-10-CM

## 2020-06-01 DIAGNOSIS — L98423 Non-pressure chronic ulcer of back with necrosis of muscle: Secondary | ICD-10-CM | POA: Diagnosis present

## 2020-06-01 DIAGNOSIS — E785 Hyperlipidemia, unspecified: Secondary | ICD-10-CM | POA: Diagnosis present

## 2020-06-01 DIAGNOSIS — L89510 Pressure ulcer of right ankle, unstageable: Secondary | ICD-10-CM | POA: Diagnosis not present

## 2020-06-01 DIAGNOSIS — N319 Neuromuscular dysfunction of bladder, unspecified: Secondary | ICD-10-CM | POA: Diagnosis not present

## 2020-06-01 DIAGNOSIS — I10 Essential (primary) hypertension: Secondary | ICD-10-CM | POA: Diagnosis not present

## 2020-06-01 DIAGNOSIS — R509 Fever, unspecified: Secondary | ICD-10-CM | POA: Diagnosis not present

## 2020-06-01 DIAGNOSIS — L89153 Pressure ulcer of sacral region, stage 3: Secondary | ICD-10-CM | POA: Diagnosis not present

## 2020-06-01 DIAGNOSIS — R32 Unspecified urinary incontinence: Secondary | ICD-10-CM | POA: Diagnosis present

## 2020-06-01 DIAGNOSIS — R1319 Other dysphagia: Secondary | ICD-10-CM | POA: Diagnosis not present

## 2020-06-01 DIAGNOSIS — G4733 Obstructive sleep apnea (adult) (pediatric): Secondary | ICD-10-CM | POA: Diagnosis present

## 2020-06-01 DIAGNOSIS — G373 Acute transverse myelitis in demyelinating disease of central nervous system: Secondary | ICD-10-CM | POA: Diagnosis present

## 2020-06-01 DIAGNOSIS — Z7401 Bed confinement status: Secondary | ICD-10-CM | POA: Diagnosis not present

## 2020-06-01 DIAGNOSIS — L89516 Pressure-induced deep tissue damage of right ankle: Secondary | ICD-10-CM | POA: Diagnosis present

## 2020-06-01 DIAGNOSIS — Z993 Dependence on wheelchair: Secondary | ICD-10-CM

## 2020-06-01 DIAGNOSIS — L89159 Pressure ulcer of sacral region, unspecified stage: Secondary | ICD-10-CM | POA: Diagnosis not present

## 2020-06-01 DIAGNOSIS — Z86010 Personal history of colonic polyps: Secondary | ICD-10-CM

## 2020-06-01 DIAGNOSIS — Z6836 Body mass index (BMI) 36.0-36.9, adult: Secondary | ICD-10-CM

## 2020-06-01 DIAGNOSIS — Z7409 Other reduced mobility: Secondary | ICD-10-CM | POA: Diagnosis not present

## 2020-06-01 DIAGNOSIS — R4189 Other symptoms and signs involving cognitive functions and awareness: Secondary | ICD-10-CM | POA: Diagnosis not present

## 2020-06-01 DIAGNOSIS — E871 Hypo-osmolality and hyponatremia: Secondary | ICD-10-CM | POA: Diagnosis not present

## 2020-06-01 DIAGNOSIS — E860 Dehydration: Secondary | ICD-10-CM | POA: Diagnosis not present

## 2020-06-01 DIAGNOSIS — R1311 Dysphagia, oral phase: Secondary | ICD-10-CM | POA: Diagnosis present

## 2020-06-01 DIAGNOSIS — M2550 Pain in unspecified joint: Secondary | ICD-10-CM | POA: Diagnosis not present

## 2020-06-01 DIAGNOSIS — B3781 Candidal esophagitis: Secondary | ICD-10-CM | POA: Diagnosis present

## 2020-06-01 DIAGNOSIS — M4649 Discitis, unspecified, multiple sites in spine: Secondary | ICD-10-CM | POA: Diagnosis present

## 2020-06-01 DIAGNOSIS — G8220 Paraplegia, unspecified: Secondary | ICD-10-CM | POA: Diagnosis present

## 2020-06-01 DIAGNOSIS — M869 Osteomyelitis, unspecified: Secondary | ICD-10-CM

## 2020-06-01 DIAGNOSIS — Z87440 Personal history of urinary (tract) infections: Secondary | ICD-10-CM

## 2020-06-01 DIAGNOSIS — E538 Deficiency of other specified B group vitamins: Secondary | ICD-10-CM | POA: Diagnosis present

## 2020-06-01 DIAGNOSIS — R1310 Dysphagia, unspecified: Secondary | ICD-10-CM

## 2020-06-01 DIAGNOSIS — I959 Hypotension, unspecified: Secondary | ICD-10-CM | POA: Diagnosis not present

## 2020-06-01 DIAGNOSIS — A498 Other bacterial infections of unspecified site: Secondary | ICD-10-CM | POA: Diagnosis not present

## 2020-06-01 DIAGNOSIS — E1169 Type 2 diabetes mellitus with other specified complication: Principal | ICD-10-CM | POA: Diagnosis present

## 2020-06-01 DIAGNOSIS — Z79899 Other long term (current) drug therapy: Secondary | ICD-10-CM

## 2020-06-01 DIAGNOSIS — Z8249 Family history of ischemic heart disease and other diseases of the circulatory system: Secondary | ICD-10-CM

## 2020-06-01 DIAGNOSIS — Z515 Encounter for palliative care: Secondary | ICD-10-CM | POA: Diagnosis not present

## 2020-06-01 DIAGNOSIS — R159 Full incontinence of feces: Secondary | ICD-10-CM | POA: Diagnosis present

## 2020-06-01 DIAGNOSIS — R7989 Other specified abnormal findings of blood chemistry: Secondary | ICD-10-CM | POA: Diagnosis present

## 2020-06-01 DIAGNOSIS — B964 Proteus (mirabilis) (morganii) as the cause of diseases classified elsewhere: Secondary | ICD-10-CM | POA: Diagnosis present

## 2020-06-01 DIAGNOSIS — Z7189 Other specified counseling: Secondary | ICD-10-CM | POA: Diagnosis not present

## 2020-06-01 DIAGNOSIS — K117 Disturbances of salivary secretion: Secondary | ICD-10-CM | POA: Diagnosis present

## 2020-06-01 DIAGNOSIS — E118 Type 2 diabetes mellitus with unspecified complications: Secondary | ICD-10-CM | POA: Diagnosis not present

## 2020-06-01 DIAGNOSIS — R404 Transient alteration of awareness: Secondary | ICD-10-CM | POA: Diagnosis not present

## 2020-06-01 DIAGNOSIS — E440 Moderate protein-calorie malnutrition: Secondary | ICD-10-CM | POA: Diagnosis present

## 2020-06-01 DIAGNOSIS — L89154 Pressure ulcer of sacral region, stage 4: Secondary | ICD-10-CM | POA: Diagnosis not present

## 2020-06-01 DIAGNOSIS — E876 Hypokalemia: Secondary | ICD-10-CM | POA: Diagnosis present

## 2020-06-01 DIAGNOSIS — M6258 Muscle wasting and atrophy, not elsewhere classified, other site: Secondary | ICD-10-CM | POA: Diagnosis not present

## 2020-06-01 DIAGNOSIS — G8250 Quadriplegia, unspecified: Secondary | ICD-10-CM | POA: Diagnosis not present

## 2020-06-01 DIAGNOSIS — L89526 Pressure-induced deep tissue damage of left ankle: Secondary | ICD-10-CM | POA: Diagnosis present

## 2020-06-01 DIAGNOSIS — M4646 Discitis, unspecified, lumbar region: Secondary | ICD-10-CM | POA: Diagnosis present

## 2020-06-01 DIAGNOSIS — R2681 Unsteadiness on feet: Secondary | ICD-10-CM | POA: Diagnosis not present

## 2020-06-01 DIAGNOSIS — E782 Mixed hyperlipidemia: Secondary | ICD-10-CM | POA: Diagnosis not present

## 2020-06-01 DIAGNOSIS — I2510 Atherosclerotic heart disease of native coronary artery without angina pectoris: Secondary | ICD-10-CM | POA: Diagnosis present

## 2020-06-01 DIAGNOSIS — M4626 Osteomyelitis of vertebra, lumbar region: Secondary | ICD-10-CM | POA: Diagnosis present

## 2020-06-01 DIAGNOSIS — Z881 Allergy status to other antibiotic agents status: Secondary | ICD-10-CM

## 2020-06-01 DIAGNOSIS — Z452 Encounter for adjustment and management of vascular access device: Secondary | ICD-10-CM | POA: Diagnosis not present

## 2020-06-01 DIAGNOSIS — Z9049 Acquired absence of other specified parts of digestive tract: Secondary | ICD-10-CM

## 2020-06-01 DIAGNOSIS — Z801 Family history of malignant neoplasm of trachea, bronchus and lung: Secondary | ICD-10-CM

## 2020-06-01 DIAGNOSIS — Z825 Family history of asthma and other chronic lower respiratory diseases: Secondary | ICD-10-CM

## 2020-06-01 DIAGNOSIS — G360 Neuromyelitis optica [Devic]: Secondary | ICD-10-CM | POA: Diagnosis not present

## 2020-06-01 DIAGNOSIS — Z85820 Personal history of malignant melanoma of skin: Secondary | ICD-10-CM | POA: Diagnosis not present

## 2020-06-01 DIAGNOSIS — L89626 Pressure-induced deep tissue damage of left heel: Secondary | ICD-10-CM | POA: Diagnosis present

## 2020-06-01 DIAGNOSIS — Z95828 Presence of other vascular implants and grafts: Secondary | ICD-10-CM

## 2020-06-01 DIAGNOSIS — L89616 Pressure-induced deep tissue damage of right heel: Secondary | ICD-10-CM | POA: Diagnosis present

## 2020-06-01 DIAGNOSIS — R410 Disorientation, unspecified: Secondary | ICD-10-CM | POA: Diagnosis not present

## 2020-06-01 DIAGNOSIS — Z800 Family history of malignant neoplasm of digestive organs: Secondary | ICD-10-CM

## 2020-06-01 DIAGNOSIS — Z880 Allergy status to penicillin: Secondary | ICD-10-CM

## 2020-06-01 DIAGNOSIS — L0231 Cutaneous abscess of buttock: Secondary | ICD-10-CM | POA: Diagnosis present

## 2020-06-01 DIAGNOSIS — R5381 Other malaise: Secondary | ICD-10-CM | POA: Diagnosis not present

## 2020-06-01 DIAGNOSIS — Z7982 Long term (current) use of aspirin: Secondary | ICD-10-CM

## 2020-06-01 DIAGNOSIS — L89156 Pressure-induced deep tissue damage of sacral region: Secondary | ICD-10-CM | POA: Diagnosis not present

## 2020-06-01 DIAGNOSIS — K1230 Oral mucositis (ulcerative), unspecified: Secondary | ICD-10-CM | POA: Diagnosis not present

## 2020-06-01 DIAGNOSIS — R627 Adult failure to thrive: Secondary | ICD-10-CM | POA: Diagnosis not present

## 2020-06-01 DIAGNOSIS — R278 Other lack of coordination: Secondary | ICD-10-CM | POA: Diagnosis not present

## 2020-06-01 DIAGNOSIS — R1312 Dysphagia, oropharyngeal phase: Secondary | ICD-10-CM | POA: Diagnosis not present

## 2020-06-01 DIAGNOSIS — I252 Old myocardial infarction: Secondary | ICD-10-CM

## 2020-06-01 DIAGNOSIS — M6281 Muscle weakness (generalized): Secondary | ICD-10-CM | POA: Diagnosis not present

## 2020-06-01 DIAGNOSIS — B961 Klebsiella pneumoniae [K. pneumoniae] as the cause of diseases classified elsewhere: Secondary | ICD-10-CM | POA: Diagnosis present

## 2020-06-01 DIAGNOSIS — R600 Localized edema: Secondary | ICD-10-CM | POA: Diagnosis not present

## 2020-06-01 DIAGNOSIS — R41841 Cognitive communication deficit: Secondary | ICD-10-CM | POA: Diagnosis not present

## 2020-06-01 DIAGNOSIS — L89310 Pressure ulcer of right buttock, unstageable: Secondary | ICD-10-CM | POA: Diagnosis not present

## 2020-06-01 DIAGNOSIS — F329 Major depressive disorder, single episode, unspecified: Secondary | ICD-10-CM | POA: Diagnosis present

## 2020-06-01 DIAGNOSIS — B9689 Other specified bacterial agents as the cause of diseases classified elsewhere: Secondary | ICD-10-CM | POA: Diagnosis present

## 2020-06-01 DIAGNOSIS — K592 Neurogenic bowel, not elsewhere classified: Secondary | ICD-10-CM | POA: Diagnosis not present

## 2020-06-01 DIAGNOSIS — D649 Anemia, unspecified: Secondary | ICD-10-CM | POA: Diagnosis present

## 2020-06-01 LAB — BASIC METABOLIC PANEL
Anion gap: 10 (ref 5–15)
BUN: 17 mg/dL (ref 8–23)
CO2: 22 mmol/L (ref 22–32)
Calcium: 9.8 mg/dL (ref 8.9–10.3)
Chloride: 104 mmol/L (ref 98–111)
Creatinine, Ser: 0.31 mg/dL — ABNORMAL LOW (ref 0.61–1.24)
GFR calc Af Amer: >60 mL/min (ref >60)
GFR calc non Af Amer: >60 mL/min (ref >60)
Glucose, Bld: 129 mg/dL — ABNORMAL HIGH (ref 70–99)
Potassium: 2.5 mmol/L — CL (ref 3.5–5.1)
Sodium: 136 mmol/L (ref 135–145)

## 2020-06-01 LAB — CBC WITH DIFFERENTIAL/PLATELET
Abs Immature Granulocytes: 0.11 10*3/uL — ABNORMAL HIGH (ref 0.00–0.07)
Basophils Absolute: 0 10*3/uL (ref 0.0–0.1)
Basophils Relative: 0 %
Eosinophils Absolute: 0 10*3/uL (ref 0.0–0.5)
Eosinophils Relative: 0 %
HCT: 32.7 % — ABNORMAL LOW (ref 39.0–52.0)
Hemoglobin: 9.9 g/dL — ABNORMAL LOW (ref 13.0–17.0)
Immature Granulocytes: 1 %
Lymphocytes Relative: 5 %
Lymphs Abs: 0.5 10*3/uL — ABNORMAL LOW (ref 0.7–4.0)
MCH: 25.3 pg — ABNORMAL LOW (ref 26.0–34.0)
MCHC: 30.3 g/dL (ref 30.0–36.0)
MCV: 83.6 fL (ref 80.0–100.0)
Monocytes Absolute: 0.5 10*3/uL (ref 0.1–1.0)
Monocytes Relative: 5 %
Neutro Abs: 8.6 10*3/uL — ABNORMAL HIGH (ref 1.7–7.7)
Neutrophils Relative %: 89 %
Platelets: 499 10*3/uL — ABNORMAL HIGH (ref 150–400)
RBC: 3.91 MIL/uL — ABNORMAL LOW (ref 4.22–5.81)
RDW: 14.1 % (ref 11.5–15.5)
WBC: 9.7 10*3/uL (ref 4.0–10.5)
nRBC: 0 % (ref 0.0–0.2)

## 2020-06-01 LAB — COMPREHENSIVE METABOLIC PANEL
ALT: 68 U/L — ABNORMAL HIGH (ref 0–44)
AST: 62 U/L — ABNORMAL HIGH (ref 15–41)
Albumin: 1.5 g/dL — ABNORMAL LOW (ref 3.5–5.0)
Alkaline Phosphatase: 134 U/L — ABNORMAL HIGH (ref 38–126)
Anion gap: 14 (ref 5–15)
BUN: 23 mg/dL (ref 8–23)
CO2: 22 mmol/L (ref 22–32)
Calcium: 10.2 mg/dL (ref 8.9–10.3)
Chloride: 99 mmol/L (ref 98–111)
Creatinine, Ser: 0.4 mg/dL — ABNORMAL LOW (ref 0.61–1.24)
GFR calc Af Amer: >60 mL/min (ref >60)
GFR calc non Af Amer: >60 mL/min (ref >60)
Glucose, Bld: 170 mg/dL — ABNORMAL HIGH (ref 70–99)
Potassium: 2.7 mmol/L — CL (ref 3.5–5.1)
Sodium: 135 mmol/L (ref 135–145)
Total Bilirubin: 0.7 mg/dL (ref 0.3–1.2)
Total Protein: 5.3 g/dL — ABNORMAL LOW (ref 6.5–8.1)

## 2020-06-01 LAB — LACTIC ACID, PLASMA: Lactic Acid, Venous: 0.9 mmol/L (ref 0.5–1.9)

## 2020-06-01 LAB — GLUCOSE, CAPILLARY: Glucose-Capillary: 130 mg/dL — ABNORMAL HIGH (ref 70–99)

## 2020-06-01 LAB — MAGNESIUM: Magnesium: 1.8 mg/dL (ref 1.7–2.4)

## 2020-06-01 LAB — HEMOGLOBIN A1C
Hgb A1c MFr Bld: 6.6 % — ABNORMAL HIGH (ref 4.8–5.6)
Mean Plasma Glucose: 142.72 mg/dL

## 2020-06-01 LAB — SARS CORONAVIRUS 2 BY RT PCR (HOSPITAL ORDER, PERFORMED IN ~~LOC~~ HOSPITAL LAB): SARS Coronavirus 2: NEGATIVE

## 2020-06-01 LAB — PHOSPHORUS: Phosphorus: 3.1 mg/dL (ref 2.5–4.6)

## 2020-06-01 MED ORDER — SODIUM CHLORIDE 0.9 % IV SOLN
2.0000 g | Freq: Three times a day (TID) | INTRAVENOUS | Status: DC
  Administered 2020-06-01 – 2020-06-05 (×11): 2 g via INTRAVENOUS
  Filled 2020-06-01 (×14): qty 2

## 2020-06-01 MED ORDER — MAGNESIUM SULFATE 2 GM/50ML IV SOLN
2.0000 g | Freq: Once | INTRAVENOUS | Status: AC
  Administered 2020-06-01: 2 g via INTRAVENOUS
  Filled 2020-06-01: qty 50

## 2020-06-01 MED ORDER — BISACODYL 10 MG RE SUPP: 10.0000 mg | Freq: Every day | RECTAL | Status: DC | PRN

## 2020-06-01 MED ORDER — SODIUM CHLORIDE 0.9 % IV SOLN: INTRAVENOUS | Status: DC

## 2020-06-01 MED ORDER — GABAPENTIN 100 MG PO CAPS
100.0000 mg | ORAL_CAPSULE | Freq: Every day | ORAL | Status: DC
  Administered 2020-06-02 – 2020-06-07 (×5): 100 mg via ORAL
  Filled 2020-06-01 (×5): qty 1

## 2020-06-01 MED ORDER — TAMSULOSIN HCL 0.4 MG PO CAPS
0.4000 mg | ORAL_CAPSULE | Freq: Every day | ORAL | Status: DC
  Administered 2020-06-02 – 2020-06-10 (×7): 0.4 mg via ORAL
  Filled 2020-06-01 (×8): qty 1

## 2020-06-01 MED ORDER — ONDANSETRON 4 MG PO TBDP
4.0000 mg | ORAL_TABLET | Freq: Two times a day (BID) | ORAL | Status: DC
  Administered 2020-06-02 – 2020-06-10 (×14): 4 mg via ORAL
  Filled 2020-06-01 (×17): qty 1

## 2020-06-01 MED ORDER — ENOXAPARIN SODIUM 40 MG/0.4ML ~~LOC~~ SOLN
40.0000 mg | SUBCUTANEOUS | Status: DC
  Administered 2020-06-02 – 2020-06-09 (×8): 40 mg via SUBCUTANEOUS
  Filled 2020-06-01 (×9): qty 0.4

## 2020-06-01 MED ORDER — CHOLESTYRAMINE 4 G PO PACK
4.0000 g | PACK | Freq: Every day | ORAL | Status: DC
  Administered 2020-06-06 – 2020-06-07 (×2): 4 g via ORAL
  Filled 2020-06-01 (×7): qty 1

## 2020-06-01 MED ORDER — GABAPENTIN 300 MG PO CAPS
300.0000 mg | ORAL_CAPSULE | Freq: Every day | ORAL | Status: DC
  Administered 2020-06-01 – 2020-06-06 (×4): 300 mg via ORAL
  Filled 2020-06-01 (×5): qty 1

## 2020-06-01 MED ORDER — LACTATED RINGERS IV BOLUS
1000.0000 mL | Freq: Once | INTRAVENOUS | Status: AC
  Administered 2020-06-01: 1000 mL via INTRAVENOUS

## 2020-06-01 MED ORDER — ASPIRIN EC 81 MG PO TBEC
81.0000 mg | DELAYED_RELEASE_TABLET | Freq: Every day | ORAL | Status: DC
  Administered 2020-06-02 – 2020-06-06 (×3): 81 mg via ORAL
  Filled 2020-06-01 (×5): qty 1

## 2020-06-01 MED ORDER — DICLOFENAC SODIUM 1 % EX GEL
2.0000 g | Freq: Two times a day (BID) | CUTANEOUS | Status: DC
  Administered 2020-06-02 – 2020-06-09 (×12): 2 g via TOPICAL
  Filled 2020-06-01 (×2): qty 100

## 2020-06-01 MED ORDER — ACETAMINOPHEN 325 MG PO TABS
650.0000 mg | ORAL_TABLET | Freq: Four times a day (QID) | ORAL | Status: DC | PRN
  Administered 2020-06-05: 650 mg via ORAL
  Filled 2020-06-01: qty 2

## 2020-06-01 MED ORDER — ALBUMIN HUMAN 25 % IV SOLN
25.0000 g | Freq: Four times a day (QID) | INTRAVENOUS | Status: DC
  Administered 2020-06-01: 25 g via INTRAVENOUS
  Administered 2020-06-02: 12.5 g via INTRAVENOUS
  Administered 2020-06-02: 25 g via INTRAVENOUS
  Administered 2020-06-02: 12.5 g via INTRAVENOUS
  Filled 2020-06-01 (×6): qty 100

## 2020-06-01 MED ORDER — VITAMIN B-12 1000 MCG PO TABS
500.0000 ug | ORAL_TABLET | Freq: Every day | ORAL | Status: DC
  Administered 2020-06-05 – 2020-06-10 (×6): 500 ug via ORAL
  Filled 2020-06-01 (×7): qty 1

## 2020-06-01 MED ORDER — VANCOMYCIN HCL IN DEXTROSE 1-5 GM/200ML-% IV SOLN
1000.0000 mg | Freq: Two times a day (BID) | INTRAVENOUS | Status: DC
  Administered 2020-06-02 – 2020-06-05 (×6): 1000 mg via INTRAVENOUS
  Filled 2020-06-01 (×8): qty 200

## 2020-06-01 MED ORDER — LORAZEPAM 2 MG/ML IJ SOLN: 0.5000 mg | Freq: Four times a day (QID) | INTRAMUSCULAR | Status: AC | PRN

## 2020-06-01 MED ORDER — PANTOPRAZOLE SODIUM 40 MG PO TBEC
40.0000 mg | DELAYED_RELEASE_TABLET | Freq: Every day | ORAL | Status: DC
  Administered 2020-06-05 – 2020-06-07 (×3): 40 mg via ORAL
  Filled 2020-06-01 (×4): qty 1

## 2020-06-01 MED ORDER — ADULT MULTIVITAMIN W/MINERALS CH
1.0000 | ORAL_TABLET | Freq: Every day | ORAL | Status: DC
  Administered 2020-06-06 – 2020-06-07 (×2): 1 via ORAL
  Filled 2020-06-01 (×4): qty 1

## 2020-06-01 MED ORDER — INSULIN ASPART 100 UNIT/ML ~~LOC~~ SOLN
0.0000 [IU] | Freq: Three times a day (TID) | SUBCUTANEOUS | Status: DC
  Administered 2020-06-02: 2 [IU] via SUBCUTANEOUS
  Administered 2020-06-04: 3 [IU] via SUBCUTANEOUS
  Administered 2020-06-05: 5 [IU] via SUBCUTANEOUS
  Administered 2020-06-05: 3 [IU] via SUBCUTANEOUS

## 2020-06-01 MED ORDER — ICOSAPENT ETHYL 1 G PO CAPS
2.0000 g | ORAL_CAPSULE | Freq: Two times a day (BID) | ORAL | Status: DC
  Administered 2020-06-04: 2 g via ORAL
  Filled 2020-06-01 (×19): qty 2

## 2020-06-01 MED ORDER — DRONABINOL 2.5 MG PO CAPS
2.5000 mg | ORAL_CAPSULE | Freq: Two times a day (BID) | ORAL | Status: DC
  Administered 2020-06-02 – 2020-06-06 (×4): 2.5 mg via ORAL
  Filled 2020-06-01 (×5): qty 1

## 2020-06-01 MED ORDER — SACCHAROMYCES BOULARDII 250 MG PO CAPS
250.0000 mg | ORAL_CAPSULE | Freq: Two times a day (BID) | ORAL | Status: DC
  Administered 2020-06-02 – 2020-06-07 (×6): 250 mg via ORAL
  Filled 2020-06-01 (×10): qty 1

## 2020-06-01 MED ORDER — POTASSIUM CHLORIDE 10 MEQ/100ML IV SOLN
10.0000 meq | INTRAVENOUS | Status: AC
  Administered 2020-06-01 (×2): 10 meq via INTRAVENOUS
  Filled 2020-06-01: qty 100

## 2020-06-01 MED ORDER — VANCOMYCIN HCL 2000 MG/400ML IV SOLN
2000.0000 mg | Freq: Once | INTRAVENOUS | Status: AC
  Administered 2020-06-01: 2000 mg via INTRAVENOUS
  Filled 2020-06-01: qty 400

## 2020-06-01 MED ORDER — LISINOPRIL 5 MG PO TABS
2.5000 mg | ORAL_TABLET | Freq: Every day | ORAL | Status: DC
  Filled 2020-06-01 (×2): qty 1

## 2020-06-01 MED ORDER — FLUCONAZOLE 200 MG PO TABS
200.0000 mg | ORAL_TABLET | Freq: Once | ORAL | Status: AC
  Administered 2020-06-02: 200 mg via ORAL
  Filled 2020-06-01: qty 1
  Filled 2020-06-01: qty 2

## 2020-06-01 MED ORDER — LIDOCAINE VISCOUS HCL 2 % MT SOLN
15.0000 mL | OROMUCOSAL | Status: DC | PRN
  Administered 2020-06-04: 15 mL via OROMUCOSAL
  Filled 2020-06-01 (×2): qty 15

## 2020-06-01 MED ORDER — PROSOURCE PLUS PO LIQD
30.0000 mL | Freq: Two times a day (BID) | ORAL | Status: DC
  Filled 2020-06-01: qty 30

## 2020-06-01 MED ORDER — SERTRALINE HCL 50 MG PO TABS
150.0000 mg | ORAL_TABLET | Freq: Every day | ORAL | Status: DC
  Administered 2020-06-01 – 2020-06-06 (×4): 150 mg via ORAL
  Filled 2020-06-01 (×5): qty 1

## 2020-06-01 MED ORDER — FLUCONAZOLE 100 MG PO TABS
100.0000 mg | ORAL_TABLET | Freq: Every day | ORAL | Status: DC
  Administered 2020-06-03 – 2020-06-07 (×4): 100 mg via ORAL
  Filled 2020-06-01 (×5): qty 1

## 2020-06-01 MED ORDER — POTASSIUM CHLORIDE 10 MEQ/100ML IV SOLN
INTRAVENOUS | Status: AC
  Administered 2020-06-01: 10 meq
  Filled 2020-06-01: qty 100

## 2020-06-01 MED ORDER — ATORVASTATIN CALCIUM 10 MG PO TABS
20.0000 mg | ORAL_TABLET | Freq: Every day | ORAL | Status: DC
  Administered 2020-06-03 – 2020-06-07 (×4): 20 mg via ORAL
  Filled 2020-06-01 (×5): qty 2

## 2020-06-01 MED ORDER — BIOTENE DRY MOUTH MT LIQD
10.0000 mL | Freq: Two times a day (BID) | OROMUCOSAL | Status: DC
  Administered 2020-06-02 – 2020-06-09 (×13): 10 mL via OROMUCOSAL

## 2020-06-01 MED ORDER — METRONIDAZOLE IN NACL 5-0.79 MG/ML-% IV SOLN
500.0000 mg | Freq: Three times a day (TID) | INTRAVENOUS | Status: DC
  Administered 2020-06-02 – 2020-06-05 (×11): 500 mg via INTRAVENOUS
  Filled 2020-06-01 (×11): qty 100

## 2020-06-01 MED ORDER — MUSCLE RUB 10-15 % EX CREA
1.0000 "application " | TOPICAL_CREAM | Freq: Two times a day (BID) | CUTANEOUS | Status: DC
  Administered 2020-06-02 – 2020-06-09 (×12): 1 via TOPICAL
  Filled 2020-06-01 (×3): qty 85

## 2020-06-01 MED ORDER — LORAZEPAM 2 MG/ML IJ SOLN
INTRAMUSCULAR | Status: AC
  Administered 2020-06-01: 0.5 mg via INTRAVENOUS
  Filled 2020-06-01: qty 1

## 2020-06-01 MED ORDER — FLUTICASONE PROPIONATE 50 MCG/ACT NA SUSP
2.0000 | Freq: Two times a day (BID) | NASAL | Status: DC
  Administered 2020-06-02 – 2020-06-09 (×14): 2 via NASAL
  Filled 2020-06-01 (×2): qty 16

## 2020-06-01 MED ORDER — LIDOCAINE 5 % EX PTCH
1.0000 | MEDICATED_PATCH | CUTANEOUS | Status: DC
  Administered 2020-06-02 – 2020-06-09 (×6): 1 via TRANSDERMAL
  Filled 2020-06-01 (×9): qty 1

## 2020-06-01 MED ORDER — ASCORBIC ACID 500 MG PO TABS
500.0000 mg | ORAL_TABLET | Freq: Every day | ORAL | Status: DC
  Administered 2020-06-05 – 2020-06-07 (×3): 500 mg via ORAL
  Filled 2020-06-01 (×4): qty 1

## 2020-06-01 MED ORDER — EMPAGLIFLOZIN 10 MG PO TABS
10.0000 mg | ORAL_TABLET | Freq: Every day | ORAL | Status: DC
  Administered 2020-06-02: 10 mg via ORAL
  Filled 2020-06-01 (×2): qty 1

## 2020-06-01 MED ORDER — SODIUM CHLORIDE 0.9 % IV SOLN
2.0000 g | Freq: Once | INTRAVENOUS | Status: AC
  Administered 2020-06-01: 2 g via INTRAVENOUS
  Filled 2020-06-01: qty 2

## 2020-06-01 MED ORDER — OXYBUTYNIN CHLORIDE 5 MG PO TABS
5.0000 mg | ORAL_TABLET | Freq: Every day | ORAL | Status: DC
  Administered 2020-06-05 – 2020-06-07 (×3): 5 mg via ORAL
  Filled 2020-06-01 (×4): qty 1

## 2020-06-01 MED ORDER — ALBUTEROL SULFATE (2.5 MG/3ML) 0.083% IN NEBU: 2.5000 mg | INHALATION_SOLUTION | Freq: Once | RESPIRATORY_TRACT | Status: DC | PRN

## 2020-06-01 MED ORDER — PRO-STAT SUGAR FREE PO LIQD
30.0000 mL | Freq: Two times a day (BID) | ORAL | Status: DC
  Filled 2020-06-01: qty 30

## 2020-06-01 MED ORDER — ZINC SULFATE 220 (50 ZN) MG PO CAPS
220.0000 mg | ORAL_CAPSULE | Freq: Every day | ORAL | Status: DC
  Administered 2020-06-06 – 2020-06-07 (×2): 220 mg via ORAL
  Filled 2020-06-01 (×4): qty 1

## 2020-06-01 MED ORDER — LACTATED RINGERS IV SOLN: INTRAVENOUS | Status: DC

## 2020-06-01 MED ORDER — POTASSIUM CHLORIDE 20 MEQ/15ML (10%) PO SOLN
40.0000 meq | Freq: Once | ORAL | Status: AC
  Administered 2020-06-02: 40 meq via ORAL
  Filled 2020-06-01: qty 30

## 2020-06-01 MED ORDER — TRAMADOL HCL 50 MG PO TABS
50.0000 mg | ORAL_TABLET | Freq: Two times a day (BID) | ORAL | Status: DC | PRN
  Administered 2020-06-05 – 2020-06-06 (×2): 50 mg via ORAL
  Filled 2020-06-01 (×2): qty 1

## 2020-06-01 NOTE — ED Notes (Signed)
Attempted to call report, RN will call back.

## 2020-06-01 NOTE — Progress Notes (Signed)
Pt arrived to 6N10 at [facility] from ED, Air loss bed ordered right away and pt was placed on new bed. BP 95/43. Dr. Roosevelt Locks texted about cardiac monitoring and he agreed due to K being 2.7. Pressure Injury Present on Admission:  Stage 4 with significant tunneling and moderate amounts of tan/pink drainage. Packed sacral Pressure injury wound with very lightly moistened guaze, covered with ABDs and skin prepped whole area surrounding wound and used hyperfix tape. Asked Dr. Roosevelt Locks about consulting [facility] for potential wound debridment.  Measurements: 10.0 x 6.5 cm with brown eschar surrounding measuring total area 12.0 x 11.0 cm. Tunneling at 12:00=3.4 cm, at 3:00=1.4 cm, at 4:00=2.8 cm and at 9:00=3.4 cm, at 10:00=2.1 cm. Skin tears present on the left gluteal fold was 1x 0.5 cm and the right gluteal fold was 1.5x3 cm and was draining clear drainage. Foam dressings placed over gluteal fold skin tears. Foley catheter tubing was secured with a new securement device for stabilization. Pt oozing  from rectum, did not have a flexiseal on the dept at present but ordered one. The BM oozing from the rectum may be too thick to use a fecal containment device but it may be possible.

## 2020-06-01 NOTE — Progress Notes (Signed)
Pt refused 2200 PO Medications

## 2020-06-01 NOTE — Consult Note (Addendum)
Consultation Note Date: 06/01/2020   Patient Name: Brent [name].  DOB: [birth year]  MRN: [id number]  Age / Sex: 74 y.o., male  PCP: [name], [name], MD Referring Physician: Lequita Halt, MD  Reason for Consultation: Establishing goals of care  HPI/Patient Profile: 74 y.o. male  with past medical history of recently diagnosed transverse myelitis and paraplegia secondary to transverse myelitis admitted on 06/01/2020 with difficulty eating and failure to thrive. 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 Patient then went to inpatient rehab 7/7-8/6 with some improvement in functional status but continued to require extensive care. He was discharged to [facility] on 8/6.  He presented to the ED 9/6 with painful swallowing, discharged with diagnosis of dehydration and dry mouth. His nutritional status and appetite has continued to decline since that time. Patient reports swallowing is painful in the back of his mouth.  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 He is somewhat lethargic during my assessment, not able to tell me where he is.  I spoke with wife/Jean by phone to discuss diagnosis, prognosis, GOC, EOL  wishes, disposition, and options.  I introduced Palliative Medicine as specialized medical care for people living with serious illness. It focuses on providing relief from the symptoms and stress of a serious illness.   We discussed a brief life review of the patient/Brent [name]. He worked for the [employer] for his entire career, worked his way up, and retired as the Market researcher. He and Brent [name] have been married for 51 years. They had 1 son, who unfortunately passed away in 12/18/2011. They are now raising his son/Brent [name] (their grandson), who is 61 years old. Brent [name] reports that Pilar Plate has essentially been Brent [name]'s father. She laments that Brent [name]'s illness "came out of the blue" one day in June, and has left a huge void in her and Brent [name]'s life.   As far as functional status, patient cannot use his lower extremities at all but does have limited use of upper extremities. He remain incontinent of bowel and bladder. Prior to discharge to [facility], patient was using a power wheelchair at "modified independent level". Brent [name] expresses frustration that he has not been allowed to use the power wheelchair at [facility].   We discussed his current illness and what it means in the larger context of his ongoing co-morbidities.  Natural disease trajectory of myelitis was discussed. I provided education that  his myelitis is likely irreversible.   We discussed the complications associated with prolonged immobility, including pressure ulcers and infection. I provided education that his wound is extensive. Brent [name] expresses significant frustration with the care her husband received at [facility]. Brent [name] is hopeful the wound will improve with proper positioning and improved nutrition. She inquires about placing a feeding tube to improve his nutritional status. We discussed benefits versus burdens of artificial feeding. I emphasized that feeding tubes are not recommended for patient with irreversible dysphagia or failure to thrive. I  provided education on the burdens of feeding tubes (possible infection, loss of pleasure from eating, risk of aspiration).   I attempted to elicit values and goals of care important to the patient. Brent [name] states the goal is for patient to return home at some point and use the power wheelchair. She understands he has extensive care needs. She states they have resources to hire caregivers at home. She states she had a ramp built at their house.    The difference between aggressive medical intervention and comfort care was considered in light of the patient's goals of care. At this time, wife/Jean wants to pursue full scope medical work-up and treatment.   Advanced directives, concepts specific to code status, artifical feeding and hydration, and rehospitalization were considered and discussed. In the event of cardiopulmonary arrest, Brent [name] wants her husband fully resuscitated despite any potential poor outcomes or additional pain/trauma.    Hospice and Palliative Care services outpatient were explained and offered. Brent [name] is not interested in hospice at this time.   Questions and concerns were addressed.  Wife/Jean was encouraged to call with questions or concerns.    SUMMARY OF RECOMMENDATIONS   - full code, full scope treatment with ongoing discussion - discussed complications of irreversible myelitis and prolonged immobility - wife is hopeful his wound will improve with proper care and nutrition - wife inquires about a feeding tube; I have provided education that feeding tubes are not recommended for irreversible dysphagia or failure to thrive -  Will start oral fluconazole for possible esophageal candida, discussed with pharmacy and Dr. Roosevelt Locks  (Fluconazole 200 mg x 1 dose, then 100 mg daily x 14 days)  Code Status/Advance Care Planning:  Full code  Palliative Prophylaxis:   Frequent Pain Assessment, Oral Care and Turn Reposition  Additional Recommendations (Limitations, Scope,  Preferences):  Full Scope Treatment  Psycho-social/Spiritual:   Desire for further Chaplaincy support: no  Created space and opportunity for patient and family to express thoughts and feelings regarding patient's current medical situation.   Emotional support provided   Prognosis:   Difficult to determine, but likely poor in the setting of myelitis, paraplegia, sacral wound, and failure to thrive  Discharge Planning: To Be Determined      Primary Diagnoses: Present on Admission: **None**   I have reviewed the medical record, interviewed the patient and family, and examined the patient. The following aspects are pertinent.  Past Medical History:  Diagnosis Date  . Allergy   . CAD (coronary artery disease)    a. angioplasty of his RCA in 1990. b. bare metal stent placed in the RCA in 2000 followed by rotational atherectomy shortly after for stent restenosis. c. last cath was in 2012 showing stable moderate diffuse CAD. (70% mid LAD, 80% diagonal, 70% Ramus, 40% mid to distal RCA stent restenosis). d. Low risk nuc in 2015.  . Diabetes mellitus   . Diverticulosis   . Elevated CK   . Erectile dysfunction   .  Hemorrhoids   . HTN (hypertension)   . Hyperlipidemia   . Hypertriglyceridemia   . Malignant melanoma of left side of neck ([place]) 10/25/2018  . Myocardial infarction ([place])   . Obesity   . OSA (obstructive sleep apnea)   . Persistent disorder of initiating or maintaining sleep   . Personal history of colonic polyps 02/05/2003    Scheduled Meds: . antiseptic oral rinse  10 mL Mouth Rinse BID  . vitamin C  500 mg Oral Daily  . aspirin EC  81 mg Oral QHS  . atorvastatin  20 mg Oral Daily  . cholestyramine  4 g Oral Daily  . diclofenac Sodium  2 g Topical BID  . dronabinol  2.5 mg Oral BID AC  . empagliflozin  10 mg Oral Daily  . enoxaparin (LOVENOX) injection  40 mg Subcutaneous Q24H  . feeding supplement (PRO-STAT SUGAR FREE 64)  30 mL Oral BID  . fluticasone  2  spray Each Nare BID  . gabapentin  100 mg Oral Daily  . gabapentin  300 mg Oral QHS  . icosapent Ethyl  2 g Oral BID  . insulin aspart  0-15 Units Subcutaneous TID WC  . lidocaine  1 patch Transdermal Q24H  . lisinopril  2.5 mg Oral Daily  . Menthol (Topical Analgesic)  1 application Apply externally BID  . multivitamin with minerals  1 tablet Oral Daily  . ondansetron  4 mg Oral BID  . oxybutynin  5 mg Oral Daily  . pantoprazole  40 mg Oral Daily  . potassium chloride  40 mEq Oral Once  . saccharomyces boulardii  250 mg Oral BID  . sertraline  150 mg Oral QHS  . tamsulosin  0.4 mg Oral QPC supper  . vitamin B-12  500 mcg Oral Daily  . zinc sulfate  220 mg Oral Daily   Continuous Infusions: . sodium chloride    . albumin human    . ceFEPime (MAXIPIME) IV    . lactated ringers 125 mL/hr at 06/01/20 1400  . metronidazole    . potassium chloride Stopped (06/01/20 1542)   PRN Meds:.acetaminophen, albuterol, bisacodyl, lidocaine, traMADol  Review of Systems  Unable to perform ROS: Other    Physical Exam Vitals reviewed.  Constitutional:      General: He is not in acute distress.    Comments: Chronically ill-appearing  HENT:     Head: Normocephalic and atraumatic.  Cardiovascular:     Rate and Rhythm: Normal rate.  Pulmonary:     Effort: Pulmonary effort is normal.  Neurological:     Mental Status: He is lethargic.     Comments: Oriented to self only     Vital Signs: BP (!) 121/52   Pulse 87   Temp 98.9 F (37.2 C) (Oral)   Resp 15   Ht 6' (1.829 m)   Wt 108 kg   SpO2 98%   BMI 32.28 kg/m  Pain Scale: 0-10   Pain Score: 2    SpO2: SpO2: 98 % O2 Device:SpO2: 98 % O2 Flow Rate: .    Palliative Assessment/Data: 20%     Time In: 15:30 Time Out: 16:40 Time Total: 70 minutes Greater than 50%  of this time was spent counseling and coordinating care related to the above assessment and plan.  Signed by: Lavena Bullion, NP   Please contact  Palliative Medicine Team phone at [phone] for questions and concerns.  For individual provider: See Shea [name]

## 2020-06-01 NOTE — Progress Notes (Signed)
Pharmacy Antibiotic Note  Brent [name]. is a 74 y.o. male admitted on 06/01/2020 with FTT.  Pharmacy has been consulted for cefepime dosing. Pt is afebrile and WBC is WNL. SCr is WNL.   Plan: Cefepime 2gm IV Q8H F/u renal fxn, C&S, clinical status  Height: 6' (182.9 cm) Weight: 108 kg (238 lb) IBW/kg (Calculated) : 77.6  Temp (24hrs), Avg:98.9 F (37.2 C), Min:98.9 F (37.2 C), Max:98.9 F (37.2 C)  Recent Labs  Lab 06/01/20 1248  WBC 9.7  CREATININE 0.40*    CrCl cannot be calculated (Unknown ideal weight.).    Allergies  Allergen Reactions  . Levofloxacin Rash  . Penicillins Hives and Rash    Tolerated cefepime and cephalexin    Antimicrobials this admission: Cefepime 9/13>> Vanc x 1 9/13  Dose adjustments this admission: N/A  Microbiology results: Pending  Thank you for allowing pharmacy to be a part of this patient's care.  [name], Rande Lawman 06/01/2020 12:33 PM

## 2020-06-01 NOTE — ED Notes (Signed)
Date and time results received: 06/01/20 1410 (use smartphrase ".now" to insert current time)  Test: Potassium  Critical Value: 2.7  Name of Provider Notified: Dr, Tyrone Nine   Orders Received? Or Actions Taken?: Orders Received - See Orders for details

## 2020-06-01 NOTE — ED Notes (Signed)
Got patient into a gown on the monitor did ekg shown to Dr [name] patient is resting with call bell in reach  

## 2020-06-01 NOTE — ED Notes (Signed)
Help patient get cleaned up patient is resting with call bell in reach  

## 2020-06-01 NOTE — ED Triage Notes (Signed)
Pt arrived from [facility] by [employer]. Wife wanted pt sent to ED to check his chronic pressure ulcer and asset need for feeding tube.  Pt is confused at baseline but oriented to self, state he is unsure of why he was sent to ED today  Per staff pt has not been eating or drinking for past week, white patches noted in mouth and pt c/o of tongue pain.

## 2020-06-01 NOTE — ED Provider Notes (Signed)
[name] Provider Note   CSN: [id number] Arrival date & time: 06/01/20  1204     History Chief Complaint  Patient presents with  . Failure To Thrive    [name]. is a 74 y.o. male.  74 yo M with a chief complaints of failure to thrive. Patient unfortunately was diagnosed with transverse myelitis back about 3 months ago. Since then he has been bedbound and has not recovered any functionality to his legs. He has developed a sacral ulcer and has not really been able to tolerate anything by mouth for at least a week and a half. He denies any abdominal pain. Denies vomiting. Feels that his mouth is very sore.  The history is provided by the patient and the spouse.  Illness Severity:  Severe Onset quality:  Gradual Duration:  2 months Timing:  Constant Progression:  Worsening Chronicity:  New Associated symptoms: no abdominal pain, no chest pain, no congestion, no diarrhea, no fever, no headaches, no myalgias, no rash, no shortness of breath and no vomiting        Past Medical History:  Diagnosis Date  . Allergy   . CAD (coronary artery disease)    a. angioplasty of his RCA in 1990. b. bare metal stent placed in the RCA in 2000 followed by rotational atherectomy shortly after for stent restenosis. c. last cath was in 2012 showing stable moderate diffuse CAD. (70% mid LAD, 80% diagonal, 70% Ramus, 40% mid to distal RCA stent restenosis). d. Low risk nuc in 2015.  . Diabetes mellitus   . Diverticulosis   . Elevated CK   . Erectile dysfunction   . Hemorrhoids   . HTN (hypertension)   . Hyperlipidemia   . Hypertriglyceridemia   . Malignant melanoma of left side of neck ([place]) 10/25/2018  . Myocardial infarction ([place])   . Obesity   . OSA (obstructive sleep apnea)   . Persistent disorder of initiating or maintaining sleep   . Personal history of colonic polyps 02/05/2003    Patient Active Problem List   Diagnosis Date Noted  .  Abdominal pain   . E. coli UTI   . Pressure injury of skin 04/09/2020  . 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 Transverse myelitis ([place])   . Labile blood pressure   . Labile blood glucose   . Neurogenic bladder   . Neurogenic bowel   . Acute blood loss anemia   . Transaminitis   . Controlled type 2 diabetes mellitus with hyperglycemia, with long-term current use of insulin ([place])   . Spinal cord infarction ([place]) 03/25/2020  . Quadriplegia and quadriparesis ([place])   . Dyslipidemia   . HCAP (healthcare-associated pneumonia)   . Acute on chronic respiratory failure with hypoxia ([place])   . Acute hypoxemic respiratory failure ([place])   . Quadriplegia ([place])   . Coronary artery disease involving native coronary artery of native heart without angina pectoris   . Steroid-induced hyperglycemia   . Diabetic peripheral neuropathy ([place])   . Tachypnea   . Hyponatremia   . AKI (acute kidney injury) ([place])   . Weakness 03/09/2020  . Chest pain 03/08/2020  . Right leg numbness   . Malignant melanoma of left side of neck ([place]) 10/25/2018  . Facial neuritis 10/25/2018  . Myofascial pain syndrome 10/25/2018  . Occipital neuralgia of left side 10/25/2018  . CAP (community acquired pneumonia) 09/08/2017  . Viral URI with cough 08/02/2017  . Male hypogonadism 06/20/2016  .  Renal lesion 06/20/2016  . Insomnia 08/07/2015  . Enlarged prostate with lower urinary tract symptoms (LUTS) 11/07/2014  . Balanitis 07/30/2012  . Bladder neck obstruction 07/30/2012  . Prostate nodule 06/18/2012  . Recurrent nephrolithiasis 06/18/2012  . Urinary urgency 06/18/2012  . Benign neoplasm of colon 08/29/2011  . DEPRESSION, SITUATIONAL, ACUTE 01/23/2010  . Essential hypertension 05/30/2009  . Coronary atherosclerosis 05/30/2009  . Obstructive sleep apnea 11/28/2008  . OBESITY 09/23/2008  . ERECTILE DYSFUNCTION 11/26/2007  . Well controlled type 2 diabetes mellitus with peripheral circulatory disorder ([place]) 05/09/2007  .  Hyperlipidemia 05/09/2007  . MYOCARDIAL INFARCTION, HX OF 05/09/2007  . DIVERTICULOSIS, COLON 05/09/2007    Past Surgical History:  Procedure Laterality Date  . CHOLECYSTECTOMY    . CORONARY ANGIOPLASTY    . CORONARY STENT PLACEMENT     stenting of the right coronary artery with followup rotational  atherectomy. (3 stents placed)  . FINGER SURGERY     right  . FOOT SURGERY     right  . INGUINAL HERNIA REPAIR     right  . melanoma removal     neck  . ORTHOPEDIC SURGERY     foot right  . rotator cuff surg     Bil       Family History  Problem Relation Age of Onset  . Liver cancer Father   . Lung cancer Father   . Heart disease Father   . Heart disease Sister   . Lung cancer Mother   . COPD Mother   . Obesity Mother   . Colon cancer Neg Hx   . Stomach cancer Neg Hx   . Pancreatic cancer Neg Hx     Social History   Tobacco Use  . Smoking status: Former Smoker    Packs/day: 1.50    Years: 30.00    Pack years: 45.00    Types: Cigarettes    Quit date: 09/19/1980    Years since quitting: 39.7  . Smokeless tobacco: Never Used  Vaping Use  . Vaping Use: Never used  Substance Use Topics  . Alcohol use: Not Currently    Alcohol/week: 7.0 - 14.0 standard drinks    Types: 7 - 14 Shots of liquor per week  . Drug use: No    Home Medications Prior to Admission medications   Medication Sig Start Date End Date Taking? Authorizing Provider  acetaminophen (TYLENOL) 325 MG tablet Take 2 tablets (650 mg total) by mouth every 6 (six) hours as needed for headache. 04/24/20  Yes Love, Ivan Anchors, PA-C  albuterol (PROVENTIL) (2.5 MG/3ML) 0.083% nebulizer solution Take 3 mLs (2.5 mg total) by nebulization once as needed for wheezing or shortness of breath (or anaphylaxis due to Rituxan infusion). 04/24/20  Yes Love, Ivan Anchors, PA-C  Amino Acids-Protein Hydrolys (FEEDING SUPPLEMENT, PRO-STAT SUGAR FREE 64,) LIQD Take 30 mLs by mouth 2 (two) times daily.   Yes [provider]    antiseptic oral rinse (BIOTENE) LIQD 10 mLs by Mouth Rinse route 2 (two) times daily. Rinse and spit   Yes [provider]  Ascorbic Acid (VITAMIN C PO) Take 500 mg by mouth daily.   Yes [provider]  aspirin EC 81 MG tablet Take 81 mg by mouth at bedtime. Swallow whole.   Yes [provider]  atorvastatin (LIPITOR) 20 MG tablet Take 1 tablet (20 mg total) by mouth daily. 03/26/20  Yes Shelly Coss, MD  bisacodyl (DULCOLAX) 10 MG suppository Place 10 mg rectally daily as  needed for moderate constipation.   Yes [provider]  cholestyramine (QUESTRAN) 4 g packet Take 4 g by mouth daily.   Yes [provider]  collagenase (SANTYL) ointment Cleanse sacral area and then apply a layer of santyl with damp to dry dressing. Change daily and prn if soiled 04/24/20  Yes Love, Ivan Anchors, PA-C  diclofenac Sodium (VOLTAREN) 1 % GEL Apply 2 g topically 2 (two) times daily. Apply to Right shoulder   Yes [provider]  empagliflozin (JARDIANCE) 10 MG TABS tablet Take 1 tablet (10 mg total) by mouth daily. 04/25/20  Yes Love, Ivan Anchors, PA-C  enoxaparin (LOVENOX) 40 MG/0.4ML injection Inject 40 mg into the skin daily. 04/29/20 06/24/20 Yes [provider]  fluticasone (FLONASE) 50 MCG/ACT nasal spray Place 2 sprays into both nostrils 2 (two) times daily. 04/24/20  Yes Love, Ivan Anchors, PA-C  gabapentin (NEURONTIN) 100 MG capsule Take 100 mg by mouth daily. Give in The Morning   Yes [provider]  gabapentin (NEURONTIN) 300 MG capsule Take 300 mg by mouth at bedtime.    Yes [provider]  Icosapent Ethyl (VASCEPA) 1 g CAPS Take 2 capsules (2 g total) by mouth 2 (two) times daily. 07/19/19  Yes Dunn, Dayna N, PA-C  insulin aspart (NOVOLOG) 100 UNIT/ML injection Inject 2-14 Units into the skin 2 (two) times daily between meals. Per sliding scale: CBG  201-250=2 units, 251-300= 4 units, 301-350= 6 units,351-400= 8 units, 401-450= 10 units,  451-500= 12 units, if over 500 14 units.   Yes [provider]  insulin glargine (LANTUS) 100 unit/mL SOPN Inject 0.3 mLs (30 Units total) into the skin at bedtime. 03/25/20  Yes Adhikari, Tamsen Meek, MD  lidocaine (LIDODERM) 5 % Place 1 patch onto the skin daily. Apply to right hip at 8 am and remove at 8 pm daily. 04/24/20  Yes Love, Ivan Anchors, PA-C  lidocaine (XYLOCAINE) 2 % solution Use as directed 15 mLs in the mouth or throat in the morning, at noon, in the evening, and at bedtime. X 14 days 05/28/20 06/11/20 Yes [provider]  lisinopril (ZESTRIL) 2.5 MG tablet Take 2.5 mg by mouth daily.   Yes [provider]  Menthol, Topical Analgesic, (BIOFREEZE) 4 % GEL Apply 1 application topically 2 (two) times daily.    Yes [provider]  Multiple Vitamin (MULTIVITAMIN WITH MINERALS) TABS tablet Take 1 tablet by mouth daily. Centrum Silver Men's   Yes [provider]  nystatin (MYCOSTATIN/NYSTOP) powder Apply 1 application topically 3 (three) times daily. 04/24/20  Yes Love, Ivan Anchors, PA-C  ondansetron (ZOFRAN-ODT) 4 MG disintegrating tablet Take 4 mg by mouth 2 (two) times daily. X 21 days 05/13/20 06/03/20 Yes [provider]  oxybutynin (DITROPAN) 5 MG tablet Take 5 mg by mouth daily.    Yes [provider]  pantoprazole (PROTONIX) 40 MG tablet Take 1 tablet (40 mg total) by mouth daily. 03/26/20  Yes Shelly Coss, MD  saccharomyces boulardii (FLORASTOR) 250 MG capsule Take 1 capsule (250 mg total) by mouth 2 (two) times daily. Patient taking differently: Take 250 mg by mouth daily.  04/24/20  Yes Love, Ivan Anchors, PA-C  sertraline (ZOLOFT) 100 MG tablet Take 150 mg by mouth at bedtime.    Yes [provider]  tamsulosin (FLOMAX) 0.4 MG CAPS capsule Take 1 capsule (0.4 mg total) by mouth daily after supper. 04/24/20  Yes Love, Ivan Anchors, PA-C  traMADol (ULTRAM) 50 MG tablet Take 1  tablet (50 mg total) by mouth every 8 (eight) hours as needed for  severe pain. Patient taking differently: Take 50 mg by mouth every 12 (twelve) hours as needed for moderate pain.  04/24/20  Yes Love, Ivan Anchors, PA-C  vitamin B-12 (CYANOCOBALAMIN) 500 MCG tablet Take 500 mcg by mouth daily.   Yes [provider]  zinc sulfate 220 (50 Zn) MG capsule Take 220 mg by mouth daily.   Yes [provider]    Allergies    Levofloxacin and Penicillins  Review of Systems   Review of Systems  Constitutional: Negative for chills and fever.  HENT: Negative for congestion and facial swelling.   Eyes: Negative for discharge and visual disturbance.  Respiratory: Negative for shortness of breath.   Cardiovascular: Negative for chest pain and palpitations.  Gastrointestinal: Negative for abdominal pain, diarrhea and vomiting.  Musculoskeletal: Negative for arthralgias and myalgias.  Skin: Positive for wound. Negative for color change and rash.  Neurological: Negative for tremors, syncope and headaches.  Psychiatric/Behavioral: Negative for confusion and dysphoric mood.    Physical Exam Updated Vital Signs BP (!) 121/52   Pulse 87   Temp 98.9 F (37.2 C) (Oral)   Resp 15   Ht 6' (1.829 m)   Wt 108 kg   SpO2 98%   BMI 32.28 kg/m   Physical Exam Vitals and nursing note reviewed.  Constitutional:      Appearance: He is well-developed.     Comments: Temporal muscle wasting  HENT:     Head: Normocephalic and atraumatic.  Eyes:     Pupils: Pupils are equal, round, and reactive to light.  Neck:     Vascular: No JVD.  Cardiovascular:     Rate and Rhythm: Normal rate and regular rhythm.     Heart sounds: No murmur heard.  No friction rub. No gallop.   Pulmonary:     Effort: No respiratory distress.     Breath sounds: No wheezing.  Abdominal:     General: There is no distension.     Tenderness: There is no abdominal tenderness. There is no guarding or rebound.  Genitourinary:    Comments: Very large sacral ulcer down to the muscle, some  purulent drainage peripherally. Musculoskeletal:        General: Normal range of motion.     Cervical back: Normal range of motion and neck supple.  Skin:    Coloration: Skin is not pale.     Findings: No rash.  Neurological:     Mental Status: He is alert and oriented to person, place, and time.  Psychiatric:        Behavior: Behavior normal.     ED Results / Procedures / Treatments   Labs (all labs ordered are listed, but only abnormal results are displayed) Labs Reviewed  CBC WITH DIFFERENTIAL/PLATELET - Abnormal; Notable for the following components:      Result Value   RBC 3.91 (*)    Hemoglobin 9.9 (*)    HCT 32.7 (*)    MCH 25.3 (*)    Platelets 499 (*)    Neutro Abs 8.6 (*)    Lymphs Abs 0.5 (*)    Abs Immature Granulocytes 0.11 (*)    All other components within normal limits  COMPREHENSIVE METABOLIC PANEL - Abnormal; Notable for the following components:   Potassium 2.7 (*)    Glucose, Bld 170 (*)    Creatinine, Ser 0.40 (*)    Total Protein 5.3 (*)  Albumin 1.5 (*)    AST 62 (*)    ALT 68 (*)    Alkaline Phosphatase 134 (*)    All other components within normal limits  SARS CORONAVIRUS 2 BY RT PCR (HOSPITAL ORDER, [place] [facility])  CULTURE, BLOOD (ROUTINE X 2)  CULTURE, BLOOD (ROUTINE X 2)  MAGNESIUM    EKG EKG Interpretation  Date/Time:  Monday June 01 2020 12:05:12 EDT Ventricular Rate:  83 PR Interval:    QRS Duration: 124 QT Interval:  399 QTC Calculation: 469 R Axis:   53 Text Interpretation: likely nsr, hard to appreciate p waves with background noise Multi interpolated vent premature complexes IVCD, consider atypical LBBB Artifact in lead(s) I V1 background noise TECHNICALLY DIFFICULT Confirmed by [name] [phone]) on 06/01/2020 12:07:59 PM   Radiology DG Sacrum/Coccyx  Result Date: 06/01/2020 CLINICAL DATA:  Decubitus ulceration EXAM: SACRUM AND COCCYX - 1  VIEW COMPARISON:  None. FINDINGS: Lateral views  obtained. There is air in the perineal region posteriorly which may well extend to the level of the coccyx. No bony destruction appreciable on lateral view. No fracture or diastasis. There is overlying bandage IMPRESSION: Air is seen in the perineal region on lateral view. Air appears to extend to the level of the coccyx, but no bony destruction is appreciable on lateral view. No fracture or diastasis. Note that CT could be most helpful to better assess the extent of apparent decubitus ulceration as well as to better assess the cortex of the coccyx to exclude possible more subtle bony destruction not evident by radiography. Electronically Signed   By: Lowella Grip III M.D.   On: 06/01/2020 13:36   DG Chest Port 1 View  Result Date: 06/01/2020 CLINICAL DATA:  Fever.  Sacral wound EXAM: PORTABLE CHEST 1 VIEW COMPARISON:  May 25, 2020 FINDINGS: The lungs are clear. The heart size and pulmonary vascularity are normal. No adenopathy. There is aortic atherosclerosis. No bone lesions. IMPRESSION: Lungs clear.  Cardiac silhouette within normal limits. Aortic Atherosclerosis (ICD10-I70.0). Electronically Signed   By: Lowella Grip III M.D.   On: 06/01/2020 13:36    Procedures Procedures (including critical care time)  Medications Ordered in ED Medications  lactated ringers infusion ( Intravenous New Bag/Given 06/01/20 1400)  vancomycin (VANCOREADY) IVPB 2000 mg/400 mL (2,000 mg Intravenous New Bag/Given 06/01/20 1358)  potassium chloride 10 mEq in 100 mL IVPB (10 mEq Intravenous New Bag/Given 06/01/20 1433)  potassium chloride 20 MEQ/15ML (10%) solution 40 mEq (has no administration in time range)  magnesium sulfate IVPB 2 g 50 mL (2 g Intravenous New Bag/Given 06/01/20 1427)  ceFEPIme (MAXIPIME) 2 g in sodium chloride 0.9 % 100 mL IVPB (has no administration in time range)  lactated ringers bolus 1,000 mL (0 mLs Intravenous Stopped 06/01/20 1402)  ceFEPIme (MAXIPIME) 2 g in sodium chloride 0.9 %  100 mL IVPB (0 g Intravenous Stopped 06/01/20 1330)    ED Course  I have reviewed the triage vital signs and the nursing notes.  Pertinent labs & imaging results that were available during my care of the patient were reviewed by me and considered in my medical decision making (see chart for details).    MDM Rules/Calculators/A&P                          74 yo M with a chief complaint of failure to thrive. The patient unfortunately had been diagnosed with transverse myelitis back in  June and since then has not had any significant recovery with muscle strength in his legs. Has been bedbound and has developed a significant sacral ulcer. The patient and his wife would like further treatment for the sacral ulcer and are considering a feeding tube. Will start on antibiotics. I did discuss the case with Dr. Leonel Ramsay, neurology he felt that the patient's chance of having significant recovery to strength to his lower extremities at this point would be very low. Palliative care consult placed.   Palliative to see.    K low at 2.7, will replete.  Discuss with medicine.   The patients results and plan were reviewed and discussed.   Any x-rays performed were independently reviewed by myself.   Differential diagnosis were considered with the presenting HPI.  Medications  lactated ringers infusion ( Intravenous New Bag/Given 06/01/20 1400)  vancomycin (VANCOREADY) IVPB 2000 mg/400 mL (2,000 mg Intravenous New Bag/Given 06/01/20 1358)  potassium chloride 10 mEq in 100 mL IVPB (10 mEq Intravenous New Bag/Given 06/01/20 1433)  potassium chloride 20 MEQ/15ML (10%) solution 40 mEq (has no administration in time range)  magnesium sulfate IVPB 2 g 50 mL (2 g Intravenous New Bag/Given 06/01/20 1427)  ceFEPIme (MAXIPIME) 2 g in sodium chloride 0.9 % 100 mL IVPB (has no administration in time range)  lactated ringers bolus 1,000 mL (0 mLs Intravenous Stopped 06/01/20 1402)  ceFEPIme (MAXIPIME) 2 g in sodium  chloride 0.9 % 100 mL IVPB (0 g Intravenous Stopped 06/01/20 1330)    Vitals:   06/01/20 1345 06/01/20 1400 06/01/20 1415 06/01/20 1430  BP: (!) 115/46 (!) 122/45 (!) 118/44 (!) 121/52  Pulse: 86 82 85 87  Resp: (!) 23 15 17 15   Temp:      TempSrc:      SpO2: 98% 98% 98% 98%  Weight:  108 kg    Height:  6' (1.829 m)      Final diagnoses:  Sacral ulcer, with necrosis of muscle (HCC)  Transverse myelitis (HCC)  FTT (failure to thrive) in adult    Admission/ observation were discussed with the admitting physician, patient and/or family and they are comfortable with the plan.     Final Clinical Impression(s) / ED Diagnoses Final diagnoses:  Sacral ulcer, with necrosis of muscle (HCC)  Transverse myelitis ([place])  FTT (failure to thrive) in adult    Rx / DC Orders ED Discharge Orders    None       [name], DO 06/01/20 1450

## 2020-06-01 NOTE — ED Notes (Signed)
Pt returned from MRI, pt to 6N via stretcher by tech.

## 2020-06-01 NOTE — H&P (Addendum)
History and Physical    Brent [name]. PQZ:[id number] DOB: 22-Jul-1946 DOA: 06/01/2020  PCP: [name], [name], MD (Confirm with patient/family/NH records and if not entered, this has to be entered at [facility] point of entry) Patient coming from: SNF  I have personally briefly reviewed patient's old medical records in [place]  Chief Complaint: Feel weak, trouble to eat  HPI: Brent [name]. is a 74 y.o. male with medical history significant of recent diagnosis of transverse myelitis status post rituximab, paraplegia secondary to transverse myelitis, HTN, HLD, IDDM, CAD, presented with trouble eating and failure to thrive.  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  Patient then underwent inpatient rehab but his further recovery has been hampered by his nutrition status.  Looks like patient developed mild dysphagia and dryness in the mouth on last admission, patient told me that has never went away.  Since last week, his swallow condition worsened, same time, his appetite also dropped significantly.  He told me that even swallow at the point water caused "soreness on the back of the mouth".  Mobility wise, his condition remains the same, paraplegia, he cannot move any of the lower extremities, but light touch sensation preserved.  And he remains incontinent of both urine and stools.  He further report having a lot of pain on his back which need narcotics.  Denies any fever chills.  He is able to use both arms and hands.  No vision changes or hearing problems. ED Course: Potassium 2.7, sacral x-ray showed gas in surrounding tissue and inflammation in the muscle.  Review of Systems: As per HPI otherwise 14 point review of systems negative.    Past Medical History:  Diagnosis Date  . Allergy     . CAD (coronary artery disease)    a. angioplasty of his RCA in 1990. b. bare metal stent placed in the RCA in 2000 followed by rotational atherectomy shortly after for stent restenosis. c. last cath was in 2012 showing stable moderate diffuse CAD. (70% mid LAD, 80% diagonal, 70% Ramus, 40% mid to distal RCA stent restenosis). d. Low risk nuc in 2015.  . Diabetes mellitus   . Diverticulosis   . Elevated CK   . Erectile dysfunction   . Hemorrhoids   . HTN (hypertension)   . Hyperlipidemia   . Hypertriglyceridemia   . Malignant melanoma of left side of neck ([place]) 10/25/2018  . Myocardial infarction ([place])   . Obesity   . OSA (obstructive sleep apnea)   . Persistent disorder of initiating or maintaining sleep   . Personal history of colonic polyps 02/05/2003    Past Surgical History:  Procedure Laterality Date  . CHOLECYSTECTOMY    . CORONARY ANGIOPLASTY    . CORONARY STENT PLACEMENT     stenting of the right coronary artery with followup rotational  atherectomy. (3 stents placed)  . FINGER SURGERY     right  . FOOT SURGERY     right  . INGUINAL HERNIA REPAIR     right  . melanoma removal     neck  . ORTHOPEDIC SURGERY     foot right  . rotator cuff surg     Bil     reports that he quit smoking about 39 years ago. His smoking use included cigarettes. He has a  45.00 pack-year smoking history. He has never used smokeless tobacco. He reports previous alcohol use of about 7.0 - 14.0 standard drinks of alcohol per week. He reports that he does not use drugs.  Allergies  Allergen Reactions  . Levofloxacin Rash  . Penicillins Hives and Rash    Tolerated cefepime and cephalexin    Family History  Problem Relation Age of Onset  . Liver cancer Father   . Lung cancer Father   . Heart disease Father   . Heart disease Sister   . Lung cancer Mother   . COPD Mother   . Obesity Mother   . Colon cancer Neg Hx   . Stomach cancer Neg Hx   . Pancreatic cancer Neg Hx      Prior  to Admission medications   Medication Sig Start Date End Date Taking? Authorizing Provider  acetaminophen (TYLENOL) 325 MG tablet Take 2 tablets (650 mg total) by mouth every 6 (six) hours as needed for headache. 04/24/20  Yes Love, Ivan Anchors, PA-C  albuterol (PROVENTIL) (2.5 MG/3ML) 0.083% nebulizer solution Take 3 mLs (2.5 mg total) by nebulization once as needed for wheezing or shortness of breath (or anaphylaxis due to Rituxan infusion). 04/24/20  Yes Love, Ivan Anchors, PA-C  Amino Acids-Protein Hydrolys (FEEDING SUPPLEMENT, PRO-STAT SUGAR FREE 64,) LIQD Take 30 mLs by mouth 2 (two) times daily.   Yes [provider]  antiseptic oral rinse (BIOTENE) LIQD 10 mLs by Mouth Rinse route 2 (two) times daily. Rinse and spit   Yes [provider]  Ascorbic Acid (VITAMIN C PO) Take 500 mg by mouth daily.   Yes [provider]  aspirin EC 81 MG tablet Take 81 mg by mouth at bedtime. Swallow whole.   Yes [provider]  atorvastatin (LIPITOR) 20 MG tablet Take 1 tablet (20 mg total) by mouth daily. 03/26/20  Yes Shelly Coss, MD  bisacodyl (DULCOLAX) 10 MG suppository Place 10 mg rectally daily as needed for moderate constipation.   Yes [provider]  cholestyramine (QUESTRAN) 4 g packet Take 4 g by mouth daily.   Yes [provider]  collagenase (SANTYL) ointment Cleanse sacral area and then apply a layer of santyl with damp to dry dressing. Change daily and prn if soiled 04/24/20  Yes Love, Ivan Anchors, PA-C  diclofenac Sodium (VOLTAREN) 1 % GEL Apply 2 g topically 2 (two) times daily. Apply to Right shoulder   Yes [provider]  empagliflozin (JARDIANCE) 10 MG TABS tablet Take 1 tablet (10 mg total) by mouth daily. 04/25/20  Yes Love, Ivan Anchors, PA-C  enoxaparin (LOVENOX) 40 MG/0.4ML injection Inject 40 mg into the skin daily. 04/29/20 06/24/20 Yes [provider]  fluticasone (FLONASE) 50 MCG/ACT nasal spray Place 2 sprays into both nostrils 2  (two) times daily. 04/24/20  Yes Love, Ivan Anchors, PA-C  gabapentin (NEURONTIN) 100 MG capsule Take 100 mg by mouth daily. Give in The Morning   Yes [provider]  gabapentin (NEURONTIN) 300 MG capsule Take 300 mg by mouth at bedtime.    Yes [provider]  Icosapent Ethyl (VASCEPA) 1 g CAPS Take 2 capsules (2 g total) by mouth 2 (two) times daily. 07/19/19  Yes Dunn, Dayna N, PA-C  insulin aspart (NOVOLOG) 100 UNIT/ML injection Inject 2-14 Units into the skin 2 (two) times daily between meals. Per sliding scale: CBG  201-250=2 units, 251-300= 4 units, 301-350= 6 units,351-400= 8 units, 401-450= 10 units, 451-500= 12 units, if over  500 14 units.   Yes [provider]  insulin glargine (LANTUS) 100 unit/mL SOPN Inject 0.3 mLs (30 Units total) into the skin at bedtime. 03/25/20  Yes Adhikari, Tamsen Meek, MD  lidocaine (LIDODERM) 5 % Place 1 patch onto the skin daily. Apply to right hip at 8 am and remove at 8 pm daily. 04/24/20  Yes Love, Ivan Anchors, PA-C  lidocaine (XYLOCAINE) 2 % solution Use as directed 15 mLs in the mouth or throat in the morning, at noon, in the evening, and at bedtime. X 14 days 05/28/20 06/11/20 Yes [provider]  lisinopril (ZESTRIL) 2.5 MG tablet Take 2.5 mg by mouth daily.   Yes [provider]  Menthol, Topical Analgesic, (BIOFREEZE) 4 % GEL Apply 1 application topically 2 (two) times daily.    Yes [provider]  Multiple Vitamin (MULTIVITAMIN WITH MINERALS) TABS tablet Take 1 tablet by mouth daily. Centrum Silver Men's   Yes [provider]  nystatin (MYCOSTATIN/NYSTOP) powder Apply 1 application topically 3 (three) times daily. 04/24/20  Yes Love, Ivan Anchors, PA-C  ondansetron (ZOFRAN-ODT) 4 MG disintegrating tablet Take 4 mg by mouth 2 (two) times daily. X 21 days 05/13/20 06/03/20 Yes [provider]  oxybutynin (DITROPAN) 5 MG tablet Take 5 mg by mouth daily.    Yes [provider]  pantoprazole (PROTONIX) 40  MG tablet Take 1 tablet (40 mg total) by mouth daily. 03/26/20  Yes Shelly Coss, MD  saccharomyces boulardii (FLORASTOR) 250 MG capsule Take 1 capsule (250 mg total) by mouth 2 (two) times daily. Patient taking differently: Take 250 mg by mouth daily.  04/24/20  Yes Love, Ivan Anchors, PA-C  sertraline (ZOLOFT) 100 MG tablet Take 150 mg by mouth at bedtime.    Yes [provider]  tamsulosin (FLOMAX) 0.4 MG CAPS capsule Take 1 capsule (0.4 mg total) by mouth daily after supper. 04/24/20  Yes Love, Ivan Anchors, PA-C  traMADol (ULTRAM) 50 MG tablet Take 1 tablet (50 mg total) by mouth every 8 (eight) hours as needed for severe pain. Patient taking differently: Take 50 mg by mouth every 12 (twelve) hours as needed for moderate pain.  04/24/20  Yes Love, Ivan Anchors, PA-C  vitamin B-12 (CYANOCOBALAMIN) 500 MCG tablet Take 500 mcg by mouth daily.   Yes [provider]  zinc sulfate 220 (50 Zn) MG capsule Take 220 mg by mouth daily.   Yes [provider]    Physical Exam: Vitals:   06/01/20 1345 06/01/20 1400 06/01/20 1415 06/01/20 1430  BP: (!) 115/46 (!) 122/45 (!) 118/44 (!) 121/52  Pulse: 86 82 85 87  Resp: (!) 23 15 17 15   Temp:      TempSrc:      SpO2: 98% 98% 98% 98%  Weight:  108 kg    Height:  6' (1.829 m)      Constitutional: NAD, calm, comfortable Vitals:   06/01/20 1345 06/01/20 1400 06/01/20 1415 06/01/20 1430  BP: (!) 115/46 (!) 122/45 (!) 118/44 (!) 121/52  Pulse: 86 82 85 87  Resp: (!) 23 15 17 15   Temp:      TempSrc:      SpO2: 98% 98% 98% 98%  Weight:  108 kg    Height:  6' (1.829 m)     Eyes: PERRL, lids and conjunctivae normal ENMT: Mucous membranes are dry. Posterior pharynx clear of any exudate or lesions.Normal dentition.  Neck: normal, supple, no masses, no thyromegaly Respiratory: clear to auscultation bilaterally, no  wheezing, no crackles. Normal respiratory effort. No accessory muscle use.  Cardiovascular: Regular rate and rhythm, no murmurs /  rubs / gallops. No extremity edema. 2+ pedal pulses. No carotid bruits.  Abdomen: no tenderness, no masses palpated. No hepatosplenomegaly. Bowel sounds positive.  Musculoskeletal: no clubbing / cyanosis. No joint deformity upper and lower extremities. Good ROM, no contractures. Normal muscle tone.  Skin: Large sacral wound deep to the muscle,(reported per ED physician, I was unable to turn patient given his significant leg weakness) Neurologic: CN 2-12 grossly intact. Sensation intact, DTR normal. Strength 0/5 in bilateral lower extremities Psychiatric: Normal judgment and insight. Alert and oriented x 3. Normal mood.      Labs on Admission: I have personally reviewed following labs and imaging studies  CBC: Recent Labs  Lab 06/01/20 1248  WBC 9.7  NEUTROABS 8.6*  HGB 9.9*  HCT 32.7*  MCV 83.6  PLT 409*   Basic Metabolic Panel: Recent Labs  Lab 06/01/20 1248  NA 135  K 2.7*  CL 99  CO2 22  GLUCOSE 170*  BUN 23  CREATININE 0.40*  CALCIUM 10.2   GFR: Estimated Creatinine Clearance: 101.3 mL/min (A) (by C-G formula based on SCr of 0.4 mg/dL (L)). Liver Function Tests: Recent Labs  Lab 06/01/20 1248  AST 62*  ALT 68*  ALKPHOS 134*  BILITOT 0.7  PROT 5.3*  ALBUMIN 1.5*   No results for input(s): LIPASE, AMYLASE in the last 168 hours. No results for input(s): AMMONIA in the last 168 hours. Coagulation Profile: No results for input(s): INR, PROTIME in the last 168 hours. Cardiac Enzymes: No results for input(s): CKTOTAL, CKMB, CKMBINDEX, TROPONINI in the last 168 hours. BNP (last 3 results) No results for input(s): PROBNP in the last 8760 hours. HbA1C: No results for input(s): HGBA1C in the last 72 hours. CBG: Recent Labs  Lab 05/25/20 1736  GLUCAP 75   Lipid Profile: No results for input(s): CHOL, HDL, LDLCALC, TRIG, CHOLHDL, LDLDIRECT in the last 72 hours. Thyroid Function Tests: No results for input(s): TSH, T4TOTAL, FREET4, T3FREE, THYROIDAB in the  last 72 hours. Anemia Panel: No results for input(s): VITAMINB12, FOLATE, FERRITIN, TIBC, IRON, RETICCTPCT in the last 72 hours. Urine analysis:    Component Value Date/Time   COLORURINE YELLOW 05/25/2020 1644   APPEARANCEUR CLEAR 05/25/2020 1644   LABSPEC >1.046 (H) 05/25/2020 1644   PHURINE 5.0 05/25/2020 1644   GLUCOSEU >=500 (A) 05/25/2020 1644   HGBUR NEGATIVE 05/25/2020 [place] 05/25/2020 1644   BILIRUBINUR n 03/18/2015 0928   KETONESUR 20 (A) 05/25/2020 1644   PROTEINUR NEGATIVE 05/25/2020 1644   UROBILINOGEN 2.0 03/18/2015 0928   UROBILINOGEN 1.0 07/16/2012 1345   NITRITE NEGATIVE 05/25/2020 1644   LEUKOCYTESUR NEGATIVE 05/25/2020 1644    Radiological Exams on Admission: DG Sacrum/Coccyx  Result Date: 06/01/2020 CLINICAL DATA:  Decubitus ulceration EXAM: SACRUM AND COCCYX - 1  VIEW COMPARISON:  None. FINDINGS: Lateral views obtained. There is air in the perineal region posteriorly which may well extend to the level of the coccyx. No bony destruction appreciable on lateral view. No fracture or diastasis. There is overlying bandage IMPRESSION: Air is seen in the perineal region on lateral view. Air appears to extend to the level of the coccyx, but no bony destruction is appreciable on lateral view. No fracture or diastasis. Note that CT could be most helpful to better assess the extent of apparent decubitus ulceration as well as to better assess the cortex of the coccyx  to exclude possible more subtle bony destruction not evident by radiography. Electronically Signed   By: Lowella Grip III M.D.   On: 06/01/2020 13:36   DG Chest Port 1 View  Result Date: 06/01/2020 CLINICAL DATA:  Fever.  Sacral wound EXAM: PORTABLE CHEST 1 VIEW COMPARISON:  May 25, 2020 FINDINGS: The lungs are clear. The heart size and pulmonary vascularity are normal. No adenopathy. There is aortic atherosclerosis. No bone lesions. IMPRESSION: Lungs clear.  Cardiac silhouette within  normal limits. Aortic Atherosclerosis (ICD10-I70.0). Electronically Signed   By: Lowella Grip III M.D.   On: 06/01/2020 13:36    EKG: Independently reviewed.  Chronic LBBB  Assessment/Plan Active Problems:   * No active hospital problems. *  (please populate well all problems here in Problem List. (For example, if patient is on BP meds at home and you resume or decide to hold them, it is a problem that needs to be her. Same for CAD, COPD, HLD and so on)  Failure to thrive, dysphagia, odynophagia and anorexia -Patient developed mild oral phase dysphagia during last hospital stay in July, appeared to tolerate solid food at that point.  However patient's dysphagia symptoms has deteriorated in the last 1 to 2 weeks.  It appears that he never regained back his baseline swallow function and on top of that patient reported poor appetite. -Discussed with patient at bedside regarding his nutrition status, patient still desires full code and wants everything to be done including a permanent feeding tube.  Placed a routine consult to [facility]. -Liberate all protein calorie restriction in diet  Paraplegia -Mobility status not much different from the condition when he was discharged on 04/24/2020, bed-wheelchair-bound. -Neurology follows, Dr. Leonel Ramsay reviewed case in the ED, opinion is any meaningful recovery outpatient lower extremity strength seems unlikely.  Prognosis extremely poor.  Left wife message to discuss about prognosis and goal of care.  Waiting for her reply. -Baseline urine and stool incontinence since myelitis.  Severe hypokalemia -Replace and recheck -Magnesium and phosphate level pending  Severe hypoalbuminemia -Albumin to 6H infusion x3 days. -Continue Ensure  Stage III sacral ulcer infection, acute on chronic, POA -Initially developed during the hospital stay in July this year. -MRI to rule out osteomyelitis -Wound care, will discuss with wound care regarding consult  surgery for debridement, however given the overall nutrition status, expect poor healing regardless of debridement were not. -Antibiotics, vancomycin, cefepime and Flagyl -Left wife at bedside, plans to discuss with wife regarding prognosis and possible change of goal of care.  History of mild oral phase dysphagia -Has been on regular diet -Marinol trial  Anorexia -Trial of Marinol  Recent transverse myelitis -As discussed above, neurology consider no further work-up or treatment is indicated.  Overall, prognosis poor.  Recent candidal esophagitis -Status post 2 weeks of nystatin treatment  IDDM -Given his poor protein calorie intake, will discontinue long-acting insulin -Sliding scale for now  Anxiety depression -SSRI  HTN -ACEI  HLD -Statin  DVT prophylaxis: Lovenox  code Status: Full code Family Communication: Left wife message Disposition Plan: Overall prognosis poor, expect 3 to 5 days hospital stay for feeding tube insertion, treatment plan for sacral osteomyelitis Consults called: Neurology, wound care, GI Admission status: MedSurg with telemetry x24 hours   Lequita Halt MD [facility] Pager [phone]  06/01/2020, 2:50 PM

## 2020-06-01 NOTE — Progress Notes (Signed)
Multiple reddened areas on bilateral feet over bony prominences, covered with heel foam protection. Bilateral heels with more purplish coloration and bilateral lateral malleolus with red/purplish areas. On the lateral outer aspect of both legs, there are reddened areas which were covered with rectangular foams. New bilateral Prevalon boots were ordered for pt.

## 2020-06-01 NOTE — Progress Notes (Signed)
MRI came back showed small abscess on sacral wound.  Discussed with patient's nurse who communicated with wound care earlier and recommend surgical debridement.  Communicate with [facility] Dr. Redmond Pulling, plan for I&D tomorrow, n.p.o. after midnight.

## 2020-06-01 NOTE — ED Notes (Signed)
Pt to MRI via stretcher by tech.

## 2020-06-01 NOTE — Progress Notes (Signed)
Pharmacy Antibiotic Note  Raiyan Dalesandro. is a 74 y.o. male admitted on 06/01/2020 with FTT.  Pharmacy has been consulted for cefepime dosing. Now adding vancomycin for concern for osteomyelitis. Pt is afebrile and WBC is WNL. SCr is WNL.   Plan: Cefepime 2gm IV Q8H Vancomycin 2 gm IV load, then start 1 gm IV Q 12 hours  F/u renal fxn, C&S, clinical status  Height: 6' (182.9 cm) Weight: 108 kg (238 lb) IBW/kg (Calculated) : 77.6  Temp (24hrs), Avg:98.9 F (37.2 C), Min:98.9 F (37.2 C), Max:98.9 F (37.2 C)  Recent Labs  Lab 06/01/20 1248  WBC 9.7  CREATININE 0.40*    Estimated Creatinine Clearance: 101.3 mL/min (A) (by C-G formula based on SCr of 0.4 mg/dL (L)).    Allergies  Allergen Reactions  . Levofloxacin Rash  . Penicillins Hives and Rash    Tolerated cefepime and cephalexin    Antimicrobials this admission: Cefepime 9/13>> Vanc x 1 9/13  Dose adjustments this admission: N/A  Microbiology results: Pending  Thank you for allowing pharmacy to be a part of this patient's care.  Albertina Parr, PharmD., BCPS, BCCCP Clinical Pharmacist Clinical phone for 06/01/20 until 11:30pm: [phone] If after 11:30pm, please refer to [facility] for unit-specific pharmacist

## 2020-06-01 NOTE — ED Notes (Signed)
Report called to Roselyn Reef, Therapist, sports. Pt to go to [facility].

## 2020-06-02 ENCOUNTER — Inpatient Hospital Stay (HOSPITAL_COMMUNITY): Payer: Medicare Other

## 2020-06-02 ENCOUNTER — Other Ambulatory Visit: Payer: Self-pay

## 2020-06-02 ENCOUNTER — Encounter (HOSPITAL_COMMUNITY): Payer: Self-pay | Admitting: Internal Medicine

## 2020-06-02 DIAGNOSIS — Z7189 Other specified counseling: Secondary | ICD-10-CM

## 2020-06-02 DIAGNOSIS — M4646 Discitis, unspecified, lumbar region: Secondary | ICD-10-CM | POA: Diagnosis present

## 2020-06-02 DIAGNOSIS — I10 Essential (primary) hypertension: Secondary | ICD-10-CM

## 2020-06-02 DIAGNOSIS — E782 Mixed hyperlipidemia: Secondary | ICD-10-CM

## 2020-06-02 DIAGNOSIS — L0231 Cutaneous abscess of buttock: Secondary | ICD-10-CM | POA: Diagnosis present

## 2020-06-02 DIAGNOSIS — R627 Adult failure to thrive: Secondary | ICD-10-CM

## 2020-06-02 DIAGNOSIS — Z515 Encounter for palliative care: Secondary | ICD-10-CM

## 2020-06-02 DIAGNOSIS — M869 Osteomyelitis, unspecified: Secondary | ICD-10-CM

## 2020-06-02 LAB — BLOOD CULTURE ID PANEL (REFLEXED) - BCID2

## 2020-06-02 LAB — CBC
HCT: 25.6 % — ABNORMAL LOW (ref 39.0–52.0)
Hemoglobin: 8.1 g/dL — ABNORMAL LOW (ref 13.0–17.0)
MCH: 26.3 pg (ref 26.0–34.0)
MCHC: 31.6 g/dL (ref 30.0–36.0)
MCV: 83.1 fL (ref 80.0–100.0)
Platelets: 449 10*3/uL — ABNORMAL HIGH (ref 150–400)
RBC: 3.08 MIL/uL — ABNORMAL LOW (ref 4.22–5.81)
RDW: 14.3 % (ref 11.5–15.5)
WBC: 8.8 10*3/uL (ref 4.0–10.5)
nRBC: 0 % (ref 0.0–0.2)

## 2020-06-02 LAB — GLUCOSE, CAPILLARY
Glucose-Capillary: 123 mg/dL — ABNORMAL HIGH (ref 70–99)
Glucose-Capillary: 79 mg/dL (ref 70–99)
Glucose-Capillary: 85 mg/dL (ref 70–99)
Glucose-Capillary: 68 mg/dL — ABNORMAL LOW (ref 70–99)
Glucose-Capillary: 74 mg/dL (ref 70–99)

## 2020-06-02 LAB — CBC WITH DIFFERENTIAL/PLATELET
Abs Immature Granulocytes: 0.09 10*3/uL — ABNORMAL HIGH (ref 0.00–0.07)
Basophils Absolute: 0 10*3/uL (ref 0.0–0.1)
Basophils Relative: 0 %
Eosinophils Absolute: 0 10*3/uL (ref 0.0–0.5)
Eosinophils Relative: 0 %
HCT: 23.4 % — ABNORMAL LOW (ref 39.0–52.0)
Hemoglobin: 7.2 g/dL — ABNORMAL LOW (ref 13.0–17.0)
Immature Granulocytes: 2 %
Lymphocytes Relative: 10 %
Lymphs Abs: 0.6 10*3/uL — ABNORMAL LOW (ref 0.7–4.0)
MCH: 25.5 pg — ABNORMAL LOW (ref 26.0–34.0)
MCHC: 30.8 g/dL (ref 30.0–36.0)
MCV: 83 fL (ref 80.0–100.0)
Monocytes Absolute: 0.4 10*3/uL (ref 0.1–1.0)
Monocytes Relative: 7 %
Neutro Abs: 4.8 10*3/uL (ref 1.7–7.7)
Neutrophils Relative %: 81 %
Platelets: 381 10*3/uL (ref 150–400)
RBC: 2.82 MIL/uL — ABNORMAL LOW (ref 4.22–5.81)
RDW: 14.1 % (ref 11.5–15.5)
WBC: 5.9 10*3/uL (ref 4.0–10.5)
nRBC: 0 % (ref 0.0–0.2)

## 2020-06-02 LAB — BASIC METABOLIC PANEL
Anion gap: 12 (ref 5–15)
BUN: 18 mg/dL (ref 8–23)
CO2: 21 mmol/L — ABNORMAL LOW (ref 22–32)
Calcium: 9.8 mg/dL (ref 8.9–10.3)
Chloride: 104 mmol/L (ref 98–111)
Creatinine, Ser: 0.34 mg/dL — ABNORMAL LOW (ref 0.61–1.24)
GFR calc Af Amer: >60 mL/min (ref >60)
GFR calc non Af Amer: >60 mL/min (ref >60)
Glucose, Bld: 126 mg/dL — ABNORMAL HIGH (ref 70–99)
Potassium: 2.8 mmol/L — ABNORMAL LOW (ref 3.5–5.1)
Sodium: 137 mmol/L (ref 135–145)

## 2020-06-02 LAB — LACTIC ACID, PLASMA: Lactic Acid, Venous: 0.8 mmol/L (ref 0.5–1.9)

## 2020-06-02 MED ORDER — INFLUENZA VAC A&B SA ADJ QUAD 0.5 ML IM PRSY
0.5000 mL | PREFILLED_SYRINGE | Freq: Once | INTRAMUSCULAR | Status: DC
  Filled 2020-06-02: qty 0.5

## 2020-06-02 MED ORDER — POTASSIUM CHLORIDE 10 MEQ/100ML IV SOLN
10.0000 meq | INTRAVENOUS | Status: AC
  Administered 2020-06-02 (×4): 10 meq via INTRAVENOUS
  Filled 2020-06-02 (×4): qty 100

## 2020-06-02 MED ORDER — CHLORHEXIDINE GLUCONATE CLOTH 2 % EX PADS
6.0000 | MEDICATED_PAD | Freq: Every day | CUTANEOUS | Status: DC
  Administered 2020-06-02 – 2020-06-10 (×9): 6 via TOPICAL

## 2020-06-02 MED ORDER — ENSURE ENLIVE PO LIQD
237.0000 mL | Freq: Two times a day (BID) | ORAL | Status: DC
  Administered 2020-06-02: 237 mL via ORAL

## 2020-06-02 NOTE — Evaluation (Signed)
Physical Therapy Evaluation Patient Details Name: Brent [name]. MRN: [id number] DOB: Apr 14, 1946 Today's Date: 06/02/2020   History of Present Illness  75 y.o. male  with past medical history of recently diagnosed transverse myelitis and paraplegia secondary to transverse myelitis returns to ED on 06/01/2020 with difficulty eating, failure to thrive, and wounds on sacrum and B feet. Was on CIR 7/7-8/6 and then to [facility]. Other PMH: OSA, HTN, CAD, DM.  Clinical Impression  Pt admitted with above diagnosis. Pt fatigued on eval with minimal verbalization possibly due to multiple tests done this AM. Required max A +2 to sit EOB and mod A to maintain sitting once up due to LOB in every direction. Pt could tolerate sitting <10 mins before needing to return to supine due to dizziness and discomfort at sacrum. Dependent for return to supine and positioning in bed.  Pt currently with functional limitations due to the deficits listed below (see PT Problem List). Pt will benefit from skilled PT to increase their independence and safety with mobility to allow discharge to the venue listed below.       Follow Up Recommendations SNF;Supervision/Assistance - 24 hour    Equipment Recommendations  None recommended by PT    Recommendations for Other Services       Precautions / Restrictions Precautions Precautions: Fall Required Braces or Orthoses: Other Brace Other Brace: B PRAFO's Restrictions Weight Bearing Restrictions: No      Mobility  Bed Mobility Overal bed mobility: Needs Assistance Bed Mobility: Supine to Sit;Sit to Supine Rolling: Max assist;+2 for physical assistance   Supine to sit: Max assist;+2 for physical assistance Sit to supine: Max assist;+2 for physical assistance   General bed mobility comments: pt able to grasp rail weakly to assist with rolling. Max A for LE's off bed and elevation of trunk into sitting and pt unable to control trunk in sitting. Dependent for  return to supine  Transfers                 General transfer comment: unable  Ambulation/Gait             General Gait Details: unable  Stairs            Wheelchair Mobility    Modified Rankin (Stroke Patients Only)       Balance Overall balance assessment: Needs assistance Sitting-balance support: Bilateral upper extremity supported;Feet supported Sitting balance-Leahy Scale: Zero Sitting balance - Comments: pt loses balance in any direction that he leans. Mod A to maintain sitting EOB and could tolerate only 8 mins due to fatigue and discomfort                                     Pertinent Vitals/Pain Pain Assessment: Faces Faces Pain Scale: Hurts even more Pain Location: generalized and sacrum Pain Descriptors / Indicators: Grimacing;Discomfort;Guarding Pain Intervention(s): Limited activity within patient's tolerance;Monitored during session    Home Living Family/patient expects to be discharged to:: Skilled nursing facility                 Additional Comments: came from [facility]    Prior Function Level of Independence: Needs assistance   Gait / Transfers Assistance Needed: per chart has mobilized only minimally since he went to [place] / Hom[employer] Assistance Needed: assist needed  Comments: prior to previous admission in 6/21 was independent, living at home with family  and liked to play golf     Hand Dominance   Dominant Hand: Left    Extremity/Trunk Assessment   Upper Extremity Assessment Upper Extremity Assessment: Generalized weakness;Difficult to assess due to impaired cognition    Lower Extremity Assessment Lower Extremity Assessment: Generalized weakness;Difficult to assess due to impaired cognition    Cervical / Trunk Assessment Cervical / Trunk Assessment: Kyphotic  Communication   Communication: Other (comment) (minimal verbalization)  Cognition Arousal/Alertness: Lethargic Behavior  During Therapy: Flat affect Overall Cognitive Status: No family/caregiver present to determine baseline cognitive functioning Area of Impairment: Following commands;Safety/judgement;Awareness;Memory;Problem solving                     Memory: Decreased short-term memory Following Commands: Follows one step commands with increased time;Follows one step commands inconsistently     Problem Solving: Slow processing;Decreased initiation;Requires verbal cues General Comments: difficult to assess due to lethargy and decreased verbalization. Has had several procedures today and is very fatigued      General Comments General comments (skin integrity, edema, etc.): Pt reported dizziness in sitting. pt positioned with pillow under R hip and PRAFO's repositioned    Exercises General Exercises - Upper Extremity Shoulder Flexion: AAROM;Both;5 reps;Supine General Exercises - Lower Extremity Long Arc Quad: PROM;Both;5 reps;Seated   Assessment/Plan    PT Assessment Patient needs continued PT services  PT Problem List Decreased strength;Decreased range of motion;Decreased activity tolerance;Decreased balance;Decreased mobility;Decreased cognition;Pain;Impaired sensation       PT Treatment Interventions DME instruction;Functional mobility training;Therapeutic activities;Therapeutic exercise;Balance training;Neuromuscular re-education;Cognitive remediation;Patient/family education    PT Goals (Current goals can be found in the Care Plan section)  Acute Rehab PT Goals Patient Stated Goal: none stated PT Goal Formulation: Patient unable to participate in goal setting Time For Goal Achievement: 06/16/20 Potential to Achieve Goals: Fair    Frequency Min 2X/week   Barriers to discharge        Co-evaluation               AM-PAC PT "6 Clicks" Mobility  Outcome Measure Help needed turning from your back to your side while in a flat bed without using bedrails?: A Lot Help needed  moving from lying on your back to sitting on the side of a flat bed without using bedrails?: A Lot Help needed moving to and from a bed to a chair (including a wheelchair)?: Total Help needed standing up from a chair using your arms (e.g., wheelchair or bedside chair)?: Total Help needed to walk in hospital room?: Total Help needed climbing 3-5 steps with a railing? : Total 6 Click Score: 8    End of Session   Activity Tolerance: Patient limited by fatigue Patient left: in bed;with call bell/phone within reach Nurse Communication: Mobility status PT Visit Diagnosis: Muscle weakness (generalized) (M62.81);Dizziness and giddiness (R42)    Time: 2671-2458 PT Time Calculation (min) (ACUTE ONLY): 17 min   Charges:   PT Evaluation $PT Eval Moderate Complexity: Cyril  Pager [phone] Office [place] 06/02/2020, 4:14 PM

## 2020-06-02 NOTE — Consult Note (Signed)
   [facility] CM Inpatient Consult   06/02/2020  Martinez Boxx June 29, 1946 [id number]   [facility] [employer] [ACO] Patient: Medicare NextGen   Patient screened for extreme high risk score for unplanned readmission score  For potential [place] Management service needs.  Medical record reviewed for history and potential post follow up needs.  Primary Care Provider is listed as this provider is [name], [name], MD listed to provide the transition of care [TOC] for post hospital follow up.  Plan:  Continue to follow progress and disposition to assess for post hospital care management needs.    For questions contact:   Natividad Brood, RN BSN [facility] Liaison  [phone] business mobile phone Toll free office [phone]  Fax number: [phone] [email or web address]

## 2020-06-02 NOTE — Progress Notes (Signed)
Wound care provided per order and tolerated well.  Will continue to monitor. °

## 2020-06-02 NOTE — Evaluation (Signed)
Clinical/Bedside Swallow Evaluation Patient Details  Name: Brent [name]. MRN: [id number] Date of Birth: 1946/06/26  Today's Date: 06/02/2020 Time: SLP Start Time (ACUTE ONLY): 1555 SLP Stop Time (ACUTE ONLY): 1610 SLP Time Calculation (min) (ACUTE ONLY): 15 min  Past Medical History:  Past Medical History:  Diagnosis Date  . Allergy   . CAD (coronary artery disease)    a. angioplasty of his RCA in 1990. b. bare metal stent placed in the RCA in 2000 followed by rotational atherectomy shortly after for stent restenosis. c. last cath was in 2012 showing stable moderate diffuse CAD. (70% mid LAD, 80% diagonal, 70% Ramus, 40% mid to distal RCA stent restenosis). d. Low risk nuc in 2015.  . Diabetes mellitus   . Diverticulosis   . Elevated CK   . Erectile dysfunction   . Hemorrhoids   . HTN (hypertension)   . Hyperlipidemia   . Hypertriglyceridemia   . Malignant melanoma of left side of neck ([place]) 10/25/2018  . Myocardial infarction ([place])   . Obesity   . OSA (obstructive sleep apnea)   . Persistent disorder of initiating or maintaining sleep   . Personal history of colonic polyps 02/05/2003   Past Surgical History:  Past Surgical History:  Procedure Laterality Date  . CHOLECYSTECTOMY    . CORONARY ANGIOPLASTY    . CORONARY STENT PLACEMENT     stenting of the right coronary artery with followup rotational  atherectomy. (3 stents placed)  . FINGER SURGERY     right  . FOOT SURGERY     right  . INGUINAL HERNIA REPAIR     right  . melanoma removal     neck  . ORTHOPEDIC SURGERY     foot right  . rotator cuff surg     Bil   HPI:  74 y.o. male  with past medical history of recently diagnosed transverse myelitis and paraplegia secondary to transverse myelitis returns to ED on 06/01/2020 with difficulty eating, failure to thrive, and wounds on sacrum and B feet. Was on CIR 7/7-8/6 and then to [facility]. Other PMH: OSA, HTN, CAD, DM.   Assessment / Plan /  Recommendation Clinical Impression  Patient presents with a mild-moderate oropharyngeal dysphagia which appears exacerbated by patient's current state of lethargy and fatigue. Of note, he has been out of room for multiple tests and procedures today and just finished with PT when SLP entered room. Patient's oral mucosa is very dry, he endorses pain along sides of front teeth/gums, and does have some dried secretions on lips and along gumline. Patient did not exhibit any overt s/s of aspiration or penetration but did exhibit suspected delayed swallow, appearance of needing to perform more effortful swallow and patient endorsing some discomfort and pain when swallowing thin liquids. SLP Visit Diagnosis: Dysphagia, unspecified (R13.10)    Aspiration Risk  Moderate aspiration risk;Mild aspiration risk;Risk for inadequate nutrition/hydration    Diet Recommendation Thin liquid;Other (Comment) (full liquids)   Liquid Administration via: Cup;Straw Medication Administration: Crushed with puree Supervision: Staff to assist with self feeding;Patient able to self feed Compensations: Minimize environmental distractions;Slow rate;Small sips/bites Postural Changes: Seated upright at 90 degrees    Other  Recommendations Oral Care Recommendations: Oral care QID   Follow up Recommendations Other (comment) (TBD)      Frequency and Duration min 2x/week  1 week       Prognosis Prognosis for Safe Diet Advancement: Good      Swallow Study   General Date  of Onset: 06/01/20 HPI: 74 y.o. male  with past medical history of recently diagnosed transverse myelitis and paraplegia secondary to transverse myelitis returns to ED on 06/01/2020 with difficulty eating, failure to thrive, and wounds on sacrum and B feet. Was on CIR 7/7-8/6 and then to [facility]. Other PMH: OSA, HTN, CAD, DM. Type of Study: Bedside Swallow Evaluation Previous Swallow Assessment: none Diet Prior to this Study: Dysphagia 3 (soft);Thin  liquids Temperature Spikes Noted: No Respiratory Status: Room air History of Recent Intubation: No Behavior/Cognition: Alert;Lethargic/Drowsy;Cooperative Oral Cavity Assessment: Dried secretions;Dry Oral Care Completed by SLP: Yes Oral Cavity - Dentition: Adequate natural dentition Self-Feeding Abilities: Needs assist Patient Positioning: Upright in bed Baseline Vocal Quality: Normal Volitional Cough: Weak Volitional Swallow: Able to elicit    Oral/Motor/Sensory Function Overall Oral Motor/Sensory Function: Generalized oral weakness Facial ROM: Within Functional Limits Facial Symmetry: Within Functional Limits Facial Strength: Within Functional Limits Facial Sensation: Within Functional Limits Lingual ROM: Within Functional Limits Lingual Symmetry: Within Functional Limits Lingual Strength: Reduced Lingual Sensation: Within Functional Limits Velum: Within Functional Limits Mandible: Within Functional Limits   Ice Chips     Thin Liquid Thin Liquid: Impaired Presentation: Cup Oral Phase Impairments: Reduced labial seal Pharyngeal  Phase Impairments: Suspected delayed Swallow Other Comments: patient endorses discomfort and some pain with swallow    Nectar Thick     Honey Thick     Puree Puree: Not tested   Solid     Solid: Not tested      Sonia Baller, MA, CCC-SLP 06/02/20 5:11 PM

## 2020-06-02 NOTE — Progress Notes (Signed)
PHARMACY - PHYSICIAN COMMUNICATION CRITICAL VALUE ALERT - BLOOD CULTURE IDENTIFICATION (BCID)  Brent [name]. is an 74 y.o. male who presented to [facility] on 06/01/2020 with a chief complaint of sacral decubitus ulcer.   Assessment: Bcx growing GNR in 1o4 bottles (aerobic only), BCID identified as proteus, no resistance detected.   Name of physician (or Provider) Contacted: Dr. Cathlean Sauer  Current antibiotics: vancomycin, cefepime, Flagyl  Changes to prescribed antibiotics recommended:  Follow up culture finalization and plans for debridement of sacral wound.  Patient is on recommended antibiotics - No changes needed  Results for orders placed or performed during the hospital encounter of 06/01/20  Blood Culture ID Panel (Reflexed) (Collected: 06/01/2020 12:35 PM)  Result Value Ref Range   Enterococcus faecalis NOT DETECTED NOT DETECTED   Enterococcus Faecium NOT DETECTED NOT DETECTED   Listeria monocytogenes NOT DETECTED NOT DETECTED   Staphylococcus species NOT DETECTED NOT DETECTED   Staphylococcus aureus (BCID) NOT DETECTED NOT DETECTED   Staphylococcus epidermidis NOT DETECTED NOT DETECTED   Staphylococcus lugdunensis NOT DETECTED NOT DETECTED   Streptococcus species NOT DETECTED NOT DETECTED   Streptococcus agalactiae NOT DETECTED NOT DETECTED   Streptococcus pneumoniae NOT DETECTED NOT DETECTED   Streptococcus pyogenes NOT DETECTED NOT DETECTED   A.calcoaceticus-baumannii NOT DETECTED NOT DETECTED   Bacteroides fragilis NOT DETECTED NOT DETECTED   Enterobacterales DETECTED (A) NOT DETECTED   Enterobacter cloacae complex NOT DETECTED NOT DETECTED   Escherichia coli NOT DETECTED NOT DETECTED   Klebsiella aerogenes NOT DETECTED NOT DETECTED   Klebsiella oxytoca NOT DETECTED NOT DETECTED   Klebsiella pneumoniae NOT DETECTED NOT DETECTED   Proteus species DETECTED (A) NOT DETECTED   Salmonella species NOT DETECTED NOT DETECTED   Serratia marcescens NOT DETECTED NOT DETECTED    Haemophilus influenzae NOT DETECTED NOT DETECTED   Neisseria meningitidis NOT DETECTED NOT DETECTED   Pseudomonas aeruginosa NOT DETECTED NOT DETECTED   Stenotrophomonas maltophilia NOT DETECTED NOT DETECTED   Candida albicans NOT DETECTED NOT DETECTED   Candida auris NOT DETECTED NOT DETECTED   Candida glabrata NOT DETECTED NOT DETECTED   Candida krusei NOT DETECTED NOT DETECTED   Candida parapsilosis NOT DETECTED NOT DETECTED   Candida tropicalis NOT DETECTED NOT DETECTED   Cryptococcus neoformans/gattii NOT DETECTED NOT DETECTED   CTX-M ESBL NOT DETECTED NOT DETECTED   Carbapenem resistance IMP NOT DETECTED NOT DETECTED   Carbapenem resistance KPC NOT DETECTED NOT DETECTED   Carbapenem resistance NDM NOT DETECTED NOT DETECTED   Carbapenem resist OXA 48 LIKE NOT DETECTED NOT DETECTED   Carbapenem resistance VIM NOT DETECTED NOT DETECTED    Romilda Garret, PharmD PGY1 Acute Care Pharmacy Resident Phone: [phone] 06/02/2020 1:25 PM  Please check [email or web address] for unit specific pharmacy phone numbers.

## 2020-06-02 NOTE — Social Work (Signed)
CSW acknowledging consult for SNF placement. Will follow for therapy recommendations needed to best determine disposition/for insurance authorization. Per chart review pt was at [facility] then [facility]. Plan for continued medical intervention, will follow.    Westley Hummer, MSW, [place] Work

## 2020-06-02 NOTE — Consult Note (Addendum)
[facility] Consult: 3:05 PM 06/02/2020  LOS: 1 day    Referring Provider: Dr Cathlean Sauer  Primary Care Physician:  [name], [name], MD Primary Gastroenterologist:  Dr Loletha Carrow     Reason for Consultation:  FTT, dysphagia   HPI: Brent [name]. is a 74 y.o. male.  PMH CAD, cardiac stenting 1990 and 2000.  IDDM.  Malignant melanoma. T2 DM Cholelithiasis, choledocholithiasis.   S/p Cholecystectomy and ERCP before 2008.  Anemia.  B12 deficiency. 1994 EGD for epig pain and FOBT +: erosive antritis.   01/2003 colonoscopy revealed diverticulosis and benign polyps. 02/2006 Colonoscopy: diverticulosis.   08/2011 colonoscopy.  Severe left-sided diverticulosis.  3 mm, sessile polyp in sigmoid resected (TA w/o HGD). 01/2017 colonoscopy. 2 sessile polyps (TAs w/o HGD), 2 to 4 mm sized, removed from ascending colon.  Pan diverticulosis with multiple large mouth diverticula throughout colon. Bleeding internal hemorrhoids, s/p O'Regan system banding 01/2019, 03/2019, 07/2019.   Dx 03/2020 w tranverse Myelitis, paraplegia.  Treated w IV steroids, IVIG, Rituximab.  Has not recovered function in his LE, so essentially bed bound at SNF.  Incontinent of urine, stool.  Developed sacral ulcer.  E coli UTI in July.    ED visit 9/6 w odynophagia.  Dx w dry mouth, dehydration. 05/25/20 CT neck: Mild asymmetric thickening of the left-sided oropharyngeal soft tissues without a discrete mass. Correlate with direct visualization. 2. No abscess or other acute abnormality identified.  Po intake and nutritional status continued decline and having pain at back of throat w po, xerostomia.  Was tolerating chopped diet and thin liquids in limited quantities.    t bili 0.7.  Alk phos 134.  AST/ALT 62/68 (74/66 on 03/18/20, 55/48 on 7/8,  45/42  on 8/4)   K 2.5.  Normal BUN/Creat.   Hgb 8.1, was 11.5 to 12.5 in 03/2020, 10.8 on 9/6 MCV 83.    Pelvic MRI shows focal ulceration along left gluteal fold with sinus tract and multilocular fluid collections within the gluteal musculature, likely small abscesses.  Also seen are changes suggestive of L4/L5 discitis/ osteomyelitis with right paravertebral loculated fluid collection up to 3.2 cm.  Diffuse increased signal in the muscle surrounding pelvis consistent with myositis. Ba Esophagram: limited due to pt unable to drink through straw, had difficulty forming seal aroound straw.  Ba administered via syringe.  No esoph dysmotility or stricture.  Unable to assess for reflux.     Met w palliative care and pt, wife decided on full scope of care and treatment.          Past Medical History:  Diagnosis Date  . Allergy   . CAD (coronary artery disease)    a. angioplasty of his RCA in 1990. b. bare metal stent placed in the RCA in 2000 followed by rotational atherectomy shortly after for stent restenosis. c. last cath was in 2012 showing stable moderate diffuse CAD. (70% mid LAD, 80% diagonal, 70% Ramus, 40% mid to distal RCA stent restenosis). d. Low risk nuc in 2015.  . Diabetes mellitus   .  Diverticulosis   . Elevated CK   . Erectile dysfunction   . Hemorrhoids   . HTN (hypertension)   . Hyperlipidemia   . Hypertriglyceridemia   . Malignant melanoma of left side of neck ([place]) 10/25/2018  . Myocardial infarction ([place])   . Obesity   . OSA (obstructive sleep apnea)   . Persistent disorder of initiating or maintaining sleep   . Personal history of colonic polyps 02/05/2003    Past Surgical History:  Procedure Laterality Date  . CHOLECYSTECTOMY    . CORONARY ANGIOPLASTY    . CORONARY STENT PLACEMENT     stenting of the right coronary artery with followup rotational  atherectomy. (3 stents placed)  . FINGER SURGERY     right  . FOOT SURGERY     right  . INGUINAL HERNIA REPAIR      right  . melanoma removal     neck  . ORTHOPEDIC SURGERY     foot right  . rotator cuff surg     Bil    Prior to Admission medications   Medication Sig Start Date End Date Taking? Authorizing Provider  acetaminophen (TYLENOL) 325 MG tablet Take 2 tablets (650 mg total) by mouth every 6 (six) hours as needed for headache. 04/24/20  Yes Love, Ivan Anchors, PA-C  albuterol (PROVENTIL) (2.5 MG/3ML) 0.083% nebulizer solution Take 3 mLs (2.5 mg total) by nebulization once as needed for wheezing or shortness of breath (or anaphylaxis due to Rituxan infusion). 04/24/20  Yes Love, Ivan Anchors, PA-C  Amino Acids-Protein Hydrolys (FEEDING SUPPLEMENT, PRO-STAT SUGAR FREE 64,) LIQD Take 30 mLs by mouth 2 (two) times daily.   Yes [provider]  antiseptic oral rinse (BIOTENE) LIQD 10 mLs by Mouth Rinse route 2 (two) times daily. Rinse and spit   Yes [provider]  Ascorbic Acid (VITAMIN C PO) Take 500 mg by mouth daily.   Yes [provider]  aspirin EC 81 MG tablet Take 81 mg by mouth at bedtime. Swallow whole.   Yes [provider]  atorvastatin (LIPITOR) 20 MG tablet Take 1 tablet (20 mg total) by mouth daily. 03/26/20  Yes Shelly Coss, MD  bisacodyl (DULCOLAX) 10 MG suppository Place 10 mg rectally daily as needed for moderate constipation.   Yes [provider]  cholestyramine (QUESTRAN) 4 g packet Take 4 g by mouth daily.   Yes [provider]  collagenase (SANTYL) ointment Cleanse sacral area and then apply a layer of santyl with damp to dry dressing. Change daily and prn if soiled 04/24/20  Yes Love, Ivan Anchors, PA-C  diclofenac Sodium (VOLTAREN) 1 % GEL Apply 2 g topically 2 (two) times daily. Apply to Right shoulder   Yes [provider]  empagliflozin (JARDIANCE) 10 MG TABS tablet Take 1 tablet (10 mg total) by mouth daily. 04/25/20  Yes Love, Ivan Anchors, PA-C  enoxaparin (LOVENOX) 40 MG/0.4ML injection Inject 40 mg into the skin daily. 04/29/20  06/24/20 Yes [provider]  fluticasone (FLONASE) 50 MCG/ACT nasal spray Place 2 sprays into both nostrils 2 (two) times daily. 04/24/20  Yes Love, Ivan Anchors, PA-C  gabapentin (NEURONTIN) 100 MG capsule Take 100 mg by mouth daily. Give in The Morning   Yes [provider]  gabapentin (NEURONTIN) 300 MG capsule Take 300 mg by mouth at bedtime.    Yes [provider]  Icosapent Ethyl (VASCEPA) 1 g CAPS Take 2 capsules (2 g total) by mouth 2 (two) times daily. 07/19/19  Yes Dunn, Dayna N, PA-C  insulin aspart (NOVOLOG) 100 UNIT/ML injection Inject 2-14 Units into the skin 2 (two) times daily between meals. Per sliding scale: CBG  201-250=2 units, 251-300= 4 units, 301-350= 6 units,351-400= 8 units, 401-450= 10 units, 451-500= 12 units, if over 500 14 units.   Yes [provider]  insulin glargine (LANTUS) 100 unit/mL SOPN Inject 0.3 mLs (30 Units total) into the skin at bedtime. 03/25/20  Yes Adhikari, Tamsen Meek, MD  lidocaine (LIDODERM) 5 % Place 1 patch onto the skin daily. Apply to right hip at 8 am and remove at 8 pm daily. 04/24/20  Yes Love, Ivan Anchors, PA-C  lidocaine (XYLOCAINE) 2 % solution Use as directed 15 mLs in the mouth or throat in the morning, at noon, in the evening, and at bedtime. X 14 days 05/28/20 06/11/20 Yes [provider]  lisinopril (ZESTRIL) 2.5 MG tablet Take 2.5 mg by mouth daily.   Yes [provider]  Menthol, Topical Analgesic, (BIOFREEZE) 4 % GEL Apply 1 application topically 2 (two) times daily.    Yes [provider]  Multiple Vitamin (MULTIVITAMIN WITH MINERALS) TABS tablet Take 1 tablet by mouth daily. Centrum Silver Men's   Yes [provider]  nystatin (MYCOSTATIN/NYSTOP) powder Apply 1 application topically 3 (three) times daily. 04/24/20  Yes Love, Ivan Anchors, PA-C  ondansetron (ZOFRAN-ODT) 4 MG disintegrating tablet Take 4 mg by mouth 2 (two) times daily. X 21 days 05/13/20 06/03/20 Yes [provider]    oxybutynin (DITROPAN) 5 MG tablet Take 5 mg by mouth daily.    Yes [provider]  pantoprazole (PROTONIX) 40 MG tablet Take 1 tablet (40 mg total) by mouth daily. 03/26/20  Yes Shelly Coss, MD  saccharomyces boulardii (FLORASTOR) 250 MG capsule Take 1 capsule (250 mg total) by mouth 2 (two) times daily. Patient taking differently: Take 250 mg by mouth daily.  04/24/20  Yes Love, Ivan Anchors, PA-C  sertraline (ZOLOFT) 100 MG tablet Take 150 mg by mouth at bedtime.    Yes [provider]  tamsulosin (FLOMAX) 0.4 MG CAPS capsule Take 1 capsule (0.4 mg total) by mouth daily after supper. 04/24/20  Yes Love, Ivan Anchors, PA-C  traMADol (ULTRAM) 50 MG tablet Take 1 tablet (50 mg total) by mouth every 8 (eight) hours as needed for severe pain. Patient taking differently: Take 50 mg by mouth every 12 (twelve) hours as needed for moderate pain.  04/24/20  Yes Love, Ivan Anchors, PA-C  vitamin B-12 (CYANOCOBALAMIN) 500 MCG tablet Take 500 mcg by mouth daily.   Yes [provider]  zinc sulfate 220 (50 Zn) MG capsule Take 220 mg by mouth daily.   Yes [provider]    Scheduled Meds: . (feeding supplement) PROSource Plus  30 mL Oral BID BM  . antiseptic oral rinse  10 mL Mouth Rinse BID  . vitamin C  500 mg Oral Daily  . aspirin EC  81 mg Oral QHS  . atorvastatin  20 mg Oral Daily  . Chlorhexidine Gluconate Cloth  6 each Topical Daily  . cholestyramine  4 g Oral Daily  . diclofenac Sodium  2 g Topical BID  . dronabinol  2.5 mg Oral BID AC  . empagliflozin  10 mg Oral Daily  . enoxaparin (LOVENOX) injection  40 mg Subcutaneous Q24H  . fluconazole  100 mg Oral Daily  . fluconazole  200 mg Oral Once  . fluticasone  2 spray Each Nare BID  .  gabapentin  100 mg Oral Daily  . gabapentin  300 mg Oral QHS  . icosapent Ethyl  2 g Oral BID  . [START ON 06/08/2020] influenza vaccine adjuvanted  0.5 mL Intramuscular Once  . insulin aspart  0-15 Units Subcutaneous TID WC  . lidocaine   1 patch Transdermal Q24H  . lisinopril  2.5 mg Oral Daily  . multivitamin with minerals  1 tablet Oral Daily  . Muscle Rub  1 application Topical BID  . ondansetron  4 mg Oral BID  . oxybutynin  5 mg Oral Daily  . pantoprazole  40 mg Oral Daily  . potassium chloride  40 mEq Oral Once  . saccharomyces boulardii  250 mg Oral BID  . sertraline  150 mg Oral QHS  . tamsulosin  0.4 mg Oral QPC supper  . vitamin B-12  500 mcg Oral Daily  . zinc sulfate  220 mg Oral Daily   Infusions: . albumin human 12.5 g (06/02/20 0939)  . ceFEPime (MAXIPIME) IV 2 g (06/02/20 1443)  . lactated ringers 75 mL/hr at 06/02/20 0931  . metronidazole 500 mg (06/02/20 0937)  . vancomycin 1,000 mg (06/02/20 1337)   PRN Meds: acetaminophen, albuterol, bisacodyl, lidocaine, traMADol   Allergies as of 06/01/2020 - Review Complete 06/01/2020  Allergen Reaction Noted  . Levofloxacin Rash 03/22/2020  . Penicillins Hives and Rash 05/30/2005    Family History  Problem Relation Age of Onset  . Liver cancer Father   . Lung cancer Father   . Heart disease Father   . Heart disease Sister   . Lung cancer Mother   . COPD Mother   . Obesity Mother   . Colon cancer Neg Hx   . Stomach cancer Neg Hx   . Pancreatic cancer Neg Hx     Social History   Socioeconomic History  . Marital status: Married    Spouse name: Not on file  . Number of children: 1  . Years of education: Not on file  . Highest education level: Not on file  Occupational History  . Occupation: Teacher, English as a foreign language    Comment: [place] Use  . Smoking status: Former Smoker    Packs/day: 1.50    Years: 30.00    Pack years: 45.00    Types: Cigarettes    Quit date: 09/19/1980    Years since quitting: 39.7  . Smokeless tobacco: Never Used  Vaping Use  . Vaping Use: Never used  Substance and Sexual Activity  . Alcohol use: Not Currently    Alcohol/week: 7.0 - 14.0 standard drinks    Types: 7 - 14 Shots of liquor per week  . Drug use:  No  . Sexual activity: Not Currently    Partners: Female  Other Topics Concern  . Not on file  Social History Narrative   12/14/2018: Lives with wife and 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 (son had passed away several years ago)   Recevies much of his care through [place]   Enjoys golfing   Social Determinants of Health   Financial Resource Strain: Low Risk   . Difficulty of Paying Living Expenses: Not hard at all  Food Insecurity:   . Worried About Charity fundraiser in the Last Year: Not on file  . Ran Out of Food in the Last Year: Not on file  Transportation Needs: No Transportation Needs  . Lack of Transportation (Medical): No  . Lack of Transportation (Non-Medical): No  Physical Activity:   . Days  of Exercise per Week: Not on file  . Minutes of Exercise per Session: Not on file  Stress:   . Feeling of Stress : Not on file  Social Connections:   . Frequency of Communication with Friends and Family: Not on file  . Frequency of Social Gatherings with Friends and Family: Not on file  . Attends Religious Services: Not on file  . Active Member of Clubs or Organizations: Not on file  . Attends Archivist Meetings: Not on file  . Marital Status: Not on file  Intimate Partner Violence:   . Fear of Current or Ex-Partner: Not on file  . Emotionally Abused: Not on file  . Physically Abused: Not on file  . Sexually Abused: Not on file    REVIEW OF SYSTEMS: Constitutional: Global weakness ENT:  No nose bleeds Pulm: No cough, no shortness of breath CV:  No palpitations, no LE edema.  No chest pressure or angina GU:  No hematuria, no frequency GI: See HPI Heme: No unusual or excessive bleeding or bruising. Transfusions: None Neuro:  No headaches, no peripheral tingling or numbness.  No seizures, no syncope. Derm: Large sacral decubitus. Endocrine:  No sweats or chills.  No polyuria or dysuria Immunization: Reviewed. Travel:  None beyond local counties in last few months.     PHYSICAL EXAM: Vital signs in last 24 hours: Vitals:   06/02/20 0518 06/02/20 1005  BP: (!) 105/46 (!) 122/51  Pulse: 75 77  Resp: 16 16  Temp: 98.1 F (36.7 C) 98.6 F (37 C)  SpO2: 99% 99%   Wt Readings from Last 3 Encounters:  06/01/20 108 kg  03/25/20 108.4 kg  03/25/20 108.6 kg    General: chronically ill looking.  Alert.   Head:  No signs of trauma.  No asymmetry  Eyes: No conjunctival pallor or scleral icterus. Ears: Not hard of hearing Nose: No discharge or congestion Mouth: Extremely dry mucous membranes with crusty exudates on the tongue and palate no sores.  No bleeding or dried blood observed.  It is very difficult for him to speak and enunciate due to his dry mouth. Neck: No JVD, no masses, no thyromegaly Lungs: No labored breathing.  No cough.  Clear bilaterally Heart: RRR. Abdomen: Soft, active bowel sounds, nontender, no active tender.  No HSM, masses, bruits, hernias.   Rectal: Deferred.  Large, deep sacral wound per ED physician exam. Musc/Skeltl: No joint redness, swelling or gross deformity. Extremities: No CCE. Neurologic:  0/5 strength LE strength.  1/5 UE strength at best.  UE tremors.   Skin: No sores, no significant bruising or purpura, no rash. Nodes: No cervical adenopathy Psych: Calm, pleasant, cooperative.  Intake/Output from previous day: 09/13 0701 - 09/14 0700 In: 3301.9 [P.O.:60; I.V.:1036.7; IV Piggyback:2205.2] Out: 1100 [Urine:1100] Intake/Output this shift: No intake/output data recorded.  LAB RESULTS: Recent Labs    06/01/20 1248 06/02/20 0035  WBC 9.7 8.8  HGB 9.9* 8.1*  HCT 32.7* 25.6*  PLT 499* 449*   BMET Lab Results  Component Value Date   NA 137 06/02/2020   NA 136 06/01/2020   NA 135 06/01/2020   K 2.8 (L) 06/02/2020   K 2.5 (LL) 06/01/2020   K 2.7 (LL) 06/01/2020   CL 104 06/02/2020   CL 104 06/01/2020   CL 99 06/01/2020   CO2 21 (L) 06/02/2020   CO2 22 06/01/2020   CO2 22 06/01/2020   GLUCOSE 126  (H) 06/02/2020   GLUCOSE 129 (H) 06/01/2020  GLUCOSE 170 (H) 06/01/2020   BUN 18 06/02/2020   BUN 17 06/01/2020   BUN 23 06/01/2020   CREATININE 0.34 (L) 06/02/2020   CREATININE 0.31 (L) 06/01/2020   CREATININE 0.40 (L) 06/01/2020   CALCIUM 9.8 06/02/2020   CALCIUM 9.8 06/01/2020   CALCIUM 10.2 06/01/2020   LFT Recent Labs    06/01/20 1248  PROT 5.3*  ALBUMIN 1.5*  AST 62*  ALT 68*  ALKPHOS 134*  BILITOT 0.7   PT/INR Lab Results  Component Value Date   INR 0.93 02/13/2011   INR 0.91 01/03/2011   Hepatitis Panel No results for input(s): HEPBSAG, HCVAB, HEPAIGM, HEPBIGM in the last 72 hours. C-Diff No components found for: CDIFF Lipase     Component Value Date/Time   LIPASE 29 03/08/2020 1142    Drugs of Abuse  No results found for: LABOPIA, COCAINSCRNUR, LABBENZ, AMPHETMU, THCU, LABBARB   RADIOLOGY STUDIES: DG Sacrum/Coccyx  Result Date: 06/01/2020 CLINICAL DATA:  Decubitus ulceration EXAM: SACRUM AND COCCYX - 1  VIEW COMPARISON:  None. FINDINGS: Lateral views obtained. There is air in the perineal region posteriorly which may well extend to the level of the coccyx. No bony destruction appreciable on lateral view. No fracture or diastasis. There is overlying bandage IMPRESSION: Air is seen in the perineal region on lateral view. Air appears to extend to the level of the coccyx, but no bony destruction is appreciable on lateral view. No fracture or diastasis. Note that CT could be most helpful to better assess the extent of apparent decubitus ulceration as well as to better assess the cortex of the coccyx to exclude possible more subtle bony destruction not evident by radiography. Electronically Signed   By: Lowella Grip III M.D.   On: 06/01/2020 13:36   MR PELVIS WO CONTRAST  Result Date: 06/01/2020 CLINICAL DATA:  Altered mental status EXAM: MRI PELVIS WITHOUT CONTRAST TECHNIQUE: Multiplanar multisequence MR imaging of the pelvis was performed. No intravenous  contrast was administered. COMPARISON:  None. FINDINGS: Urinary Tract: The visualized distal ureters appear unremarkable. A Foley catheter is seen within the bladder. Bowel: No bowel wall thickening or inflammatory changes are seen. There is a mildly dilated stool-filled rectum. Vascular/Lymphatic: No enlarged pelvic lymph nodes identified. No significant vascular findings. Reproductive: The visualized portion of the deep pelvis is grossly unremarkable. Other: Diffusely increased feathery signal seen within the muscles surrounding the pelvis and paraspinal musculature as well as the iliopsoas. Within the left gluteal musculature there appear to be several T2 bright multilocular fluid collections which extend to the overlying area of ulceration along the left gluteal fold with a sinus tract present. The largest collection measures 2.1 x 1.0 x 2.1 cm. There is overlying subcutaneous edema seen around the bilateral hips. Musculoskeletal: There is fluid seen within the disc space at L4-L5 with endplate reactive changes and question minimal cortical irregularity. There is a multilocular fluid collection extending from the right-sided disc space which measures approximately 1.7 x 1.1 x 3.2 cm. IMPRESSION: 1. Area of focal ulceration along the posterior left gluteal fold with a sinus tract and multilocular fluid collections within the left gluteal musculature, likely small intramuscular abscesses. 2. There is also findings that are suggestive discitis osteomyelitis at L4-L5 with a right paravertebral loculated fluid collection, likely small abscess measuring 1.7 x 1.1 x 3.2 cm. 3. Diffuse increased signal within the muscles surrounding the pelvis, consistent with myositis. Electronically Signed   By: Prudencio Pair M.D.   On: 06/01/2020 18:50  DG Chest Port 1 View  Result Date: 06/01/2020 CLINICAL DATA:  Fever.  Sacral wound EXAM: PORTABLE CHEST 1 VIEW COMPARISON:  May 25, 2020 FINDINGS: The lungs are clear. The  heart size and pulmonary vascularity are normal. No adenopathy. There is aortic atherosclerosis. No bone lesions. IMPRESSION: Lungs clear.  Cardiac silhouette within normal limits. Aortic Atherosclerosis (ICD10-I70.0). Electronically Signed   By: Lowella Grip III M.D.   On: 06/01/2020 13:36   DG ESOPHAGUS W SINGLE CM (SOL OR THIN BA)  Result Date: 06/02/2020 CLINICAL DATA:  Inpatient with paraplegia secondary to transverse myelitis, poor dentition and failure to thrive with weight loss. EXAM: ESOPHOGRAM/BARIUM SWALLOW TECHNIQUE: Single contrast examination was performed using  thin barium. FLUOROSCOPY TIME:  Fluoroscopy Time:  2 minutes 42 seconds Radiation Exposure Index (if provided by the fluoroscopic device): 23.6 mGy Number of Acquired Spot Images: 0 COMPARISON:  None. FINDINGS: Examination is extremely limited due to patient's inability to drink through a straw (patient appeared to have difficulty forming a seal around the straw). Examination performed in the left lateral decubitus position with mild head elevation. Thin barium was administered into the patient's mouth via syringe. Pharyngeal phase of swallowing is grossly normal with no evidence of laryngeal penetration or tracheobronchial aspiration. Esophageal motility is grossly normal. Esophageal distensibility appears normal with no evidence of esophageal mass, stricture or ulcer. No evidence of hiatal hernia. Unable to assess for gastroesophageal reflux given the above limitations. Pill not administered due to patient's limited oral phase of swallowing and inability to sit upright. IMPRESSION: 1. Very limited examination due to limited oral phase of swallowing, see comments and consider speech/swallow therapy consultation. 2. Unremarkable esophagus with no gross evidence of esophageal dysmotility or stricture. Electronically Signed   By: Ilona Sorrel M.D.   On: 06/02/2020 14:50     IMPRESSION:   *   FTT, anorexia.  MD added Marinol.     *   Dysphagia d/t severe xerostomia.  14 d course po diflucan, biotene mouth rinse in place.  By esophagram today appears to be oral phase issue.  No evidence of dysmotility or esoph stricture on albeit sub-optimal study.   No role for EGD.    *    Malnutrition.  Prosource, IV albumin in place.  ,  *   Stage 4, large sacral Decubitus ulcer with abscesses, myositis.   Fecal contamination evident per Dr Craig Guess note.  Dr Bobbye Morton has offered wound debridement, diverting colostomy as well as G tube placement.  May pusue this tmrw.    *   Progressive anemia.    *   Elevated LFTs.  *   Myositis of pelvis w abscess.  Cefepime, Vanc, Flagyl in place.     PLAN:     *    ENT versus dental consult to evaluate xerostomia  *   If G tub not placed within next 24 to 48 hours, consider dropping Cretrack tube to initiate TFs.  And RD input re nutritional supplements Will order chopped diet, thin liquids which he tolerated at [facility].    *   If marinol was added as appetite stimulant, not sure it will help.  His issue is w his dry mouth.     Azucena Freed  06/02/2020, 3:05 PM Phone [phone]

## 2020-06-02 NOTE — Consult Note (Signed)
Reason for Consult/Chief Complaint: sacral decubitus Consultant: Juleen China, MD  Brent Gip. is an 74 y.o. male.   HPI: 7M with recently diagnosed transverse myelitis and paraplegia secondary to transverse myelitis admitted for difficulty eating and failure to thrive. Treated in July with IV steroids, IVIG, and rituximab for TM followed by a month long IP rehab stay. Discharged to [facility] on 8/6.   Consult placed for sacral decubitus ulcer.   Past Medical History:  Diagnosis Date  . Allergy   . CAD (coronary artery disease)    a. angioplasty of his RCA in 1990. b. bare metal stent placed in the RCA in 2000 followed by rotational atherectomy shortly after for stent restenosis. c. last cath was in 2012 showing stable moderate diffuse CAD. (70% mid LAD, 80% diagonal, 70% Ramus, 40% mid to distal RCA stent restenosis). d. Low risk nuc in 2015.  . Diabetes mellitus   . Diverticulosis   . Elevated CK   . Erectile dysfunction   . Hemorrhoids   . HTN (hypertension)   . Hyperlipidemia   . Hypertriglyceridemia   . Malignant melanoma of left side of neck ([place]) 10/25/2018  . Myocardial infarction ([place] [place])   . Obesity   . OSA (obstructive sleep apnea)   . Persistent disorder of initiating or maintaining sleep   . Personal history of colonic polyps 02/05/2003    Past Surgical History:  Procedure Laterality Date  . CHOLECYSTECTOMY    . CORONARY ANGIOPLASTY    . CORONARY STENT PLACEMENT     stenting of the right coronary artery with followup rotational  atherectomy. (3 stents placed)  . FINGER SURGERY     right  . FOOT SURGERY     right  . INGUINAL HERNIA REPAIR     right  . melanoma removal     neck  . ORTHOPEDIC SURGERY     foot right  . rotator cuff surg     Bil    Family History  Problem Relation Age of Onset  . Liver cancer Father   . Lung cancer Father   . Heart disease Father   . Heart disease Sister   . Lung cancer Mother   . COPD Mother   . Obesity Mother    . Colon cancer Neg Hx   . Stomach cancer Neg Hx   . Pancreatic cancer Neg Hx     Social History:  reports that he quit smoking about 39 years ago. His smoking use included cigarettes. He has a 45.00 pack-year smoking history. He has never used smokeless tobacco. He reports previous alcohol use of about 7.0 - 14.0 standard drinks of alcohol per week. He reports that he does not use drugs.  Allergies:  Allergies  Allergen Reactions  . Levofloxacin Rash  . Penicillins Hives and Rash    Tolerated cefepime and cephalexin    Medications: I have reviewed the patient's current medications.  Results for orders placed or performed during the hospital encounter of 06/01/20 (from the past 48 hour(s))  SARS Coronavirus 2 by RT PCR (hospital order, performed in [facility] hospital lab) Nasopharyngeal Nasopharyngeal Swab     Status: None   Collection Time: 06/01/20 12:24 PM   Specimen: Nasopharyngeal Swab  Result Value Ref Range   SARS Coronavirus 2 NEGATIVE NEGATIVE    Comment: (NOTE) SARS-CoV-2 target nucleic acids are NOT DETECTED.  The SARS-CoV-2 RNA is generally detectable in upper and lower respiratory specimens during the acute phase of infection.  The lowest concentration of SARS-CoV-2 viral copies this assay can detect is 250 copies / mL. A negative result does not preclude SARS-CoV-2 infection and should not be used as the sole basis for treatment or other patient management decisions.  A negative result may occur with improper specimen collection / handling, submission of specimen other than nasopharyngeal swab, presence of viral mutation(s) within the areas targeted by this assay, and inadequate number of viral copies (<250 copies / mL). A negative result must be combined with clinical observations, patient history, and epidemiological information.  Fact Sheet for Patients:   StrictlyIdeas.no  Fact Sheet for Healthcare  Providers: BankingDealers.co.za  This test is not yet approved or  cleared by the [place] FDA and has been authorized for detection and/or diagnosis of SARS-CoV-2 by FDA under an Emergency Use Authorization (EUA).  This EUA will remain in effect (meaning this test can be used) for the duration of the COVID-19 declaration under Section 564(b)(1) of the Act, 21 U.S.C. section 360bbb-3(b)(1), unless the authorization is terminated or revoked sooner.  Performed at [facility], [place] [address], [place], [place] [address]   Blood culture (routine x 2)     Status: None (Preliminary result)   Collection Time: 06/01/20 12:30 PM   Specimen: BLOOD  Result Value Ref Range   Specimen Description BLOOD SITE NOT SPECIFIED    Special Requests      BOTTLES DRAWN AEROBIC AND ANAEROBIC Blood Culture adequate volume   Culture      NO GROWTH < 24 HOURS Performed at [facility], [place] [address], [place], [place] [address]    Report Status PENDING   Blood culture (routine x 2)     Status: None (Preliminary result)   Collection Time: 06/01/20 12:35 PM   Specimen: BLOOD  Result Value Ref Range   Specimen Description BLOOD SITE NOT SPECIFIED    Special Requests      BOTTLES DRAWN AEROBIC AND ANAEROBIC Blood Culture adequate volume   Culture  Setup Time      GRAM NEGATIVE RODS AEROBIC BOTTLE ONLY Organism ID to follow    Culture      NO GROWTH < 24 HOURS Performed at [facility], [place] [address], [place], [place] [address]    Report Status PENDING   CBC with Differential     Status: Abnormal   Collection Time: 06/01/20 12:48 PM  Result Value Ref Range   WBC 9.7 4.0 - 10.5 K/uL   RBC 3.91 (L) 4.22 - 5.81 MIL/uL   Hemoglobin 9.9 (L) 13.0 - 17.0 g/dL   HCT 32.7 (L) 39 - 52 %   MCV 83.6 80.0 - 100.0 fL   MCH 25.3 (L) 26.0 - 34.0 pg   MCHC 30.3 30.0 - 36.0 g/dL   RDW 14.1 11.5 - 15.5 %   Platelets 499 (H) 150 - 400 K/uL   nRBC 0.0 0.0 - 0.2 %    Neutrophils Relative % 89 %   Neutro Abs 8.6 (H) 1.7 - 7.7 K/uL   Lymphocytes Relative 5 %   Lymphs Abs 0.5 (L) 0.7 - 4.0 K/uL   Monocytes Relative 5 %   Monocytes Absolute 0.5 0 - 1 K/uL   Eosinophils Relative 0 %   Eosinophils Absolute 0.0 0 - 0 K/uL   Basophils Relative 0 %   Basophils Absolute 0.0 0 - 0 K/uL   Immature Granulocytes 1 %   Abs Immature Granulocytes 0.11 (H) 0.00 - 0.07  K/uL    Comment: Performed at [facility], [place] [address] [place], [place] [address]  Comprehensive metabolic panel     Status: Abnormal   Collection Time: 06/01/20 12:48 PM  Result Value Ref Range   Sodium 135 135 - 145 mmol/L   Potassium 2.7 (LL) 3.5 - 5.1 mmol/L    Comment: CRITICAL RESULT CALLED TO, READ BACK BY AND VERIFIED WITH: J.DODD,RN 1410 06/01/20 CLARK,S    Chloride 99 98 - 111 mmol/L   CO2 22 22 - 32 mmol/L   Glucose, Bld 170 (H) 70 - 99 mg/dL    Comment: Glucose reference range applies only to samples taken after fasting for at least 8 hours.   BUN 23 8 - 23 mg/dL   Creatinine, Ser 0.40 (L) 0.61 - 1.24 mg/dL   Calcium 10.2 8.9 - 10.3 mg/dL   Total Protein 5.3 (L) 6.5 - 8.1 g/dL   Albumin 1.5 (L) 3.5 - 5.0 g/dL   AST 62 (H) 15 - 41 U/L   ALT 68 (H) 0 - 44 U/L   Alkaline Phosphatase 134 (H) 38 - 126 U/L   Total Bilirubin 0.7 0.3 - 1.2 mg/dL   GFR calc non Af Amer >60 >60 mL/min   GFR calc Af Amer >60 >60 mL/min   Anion gap 14 5 - 15    Comment: Performed at [facility], [place] [address], [place], [place] [address]  Magnesium     Status: None   Collection Time: 06/01/20 12:48 PM  Result Value Ref Range   Magnesium 1.8 1.7 - 2.4 mg/dL    Comment: Performed at [facility], [place] [address] [place], [place] [address]  Hemoglobin A1c     Status: Abnormal   Collection Time: 06/01/20 12:48 PM  Result Value Ref Range   Hgb A1c MFr Bld 6.6 (H) 4.8 - 5.6 %    Comment: (NOTE) Pre diabetes:          5.7%-6.4%  Diabetes:              >6.4%  Glycemic control for    <7.0% adults with diabetes    Mean Plasma Glucose 142.72 mg/dL    Comment: Performed at [place] [address], [place], [place] [address]  Phosphorus     Status: None   Collection Time: 06/01/20  7:11 PM  Result Value Ref Range   Phosphorus 3.1 2.5 - 4.6 mg/dL    Comment: Performed at [facility], [place] [address], [place], [place] [address]  Basic metabolic panel     Status: Abnormal   Collection Time: 06/01/20  7:51 PM  Result Value Ref Range   Sodium 136 135 - 145 mmol/L   Potassium 2.5 (LL) 3.5 - 5.1 mmol/L    Comment: CRITICAL RESULT CALLED TO, READ BACK BY AND VERIFIED WITH: N [facility] 2038 06/01/2020 WBOND    Chloride 104 98 - 111 mmol/L   CO2 22 22 - 32 mmol/L   Glucose, Bld 129 (H) 70 - 99 mg/dL    Comment: Glucose reference range applies only to samples taken after fasting for at least 8 hours.   BUN 17 8 - 23 mg/dL   Creatinine, Ser 0.31 (L) 0.61 - 1.24 mg/dL   Calcium 9.8 8.9 - 10.3 mg/dL   GFR calc non Af Amer >60 >60 mL/min   GFR calc Af Amer >60 >60 mL/min   Anion gap 10 5 - 15  Comment: Performed at [facility], [place] [address] [place], [place] [address]  Lactic acid, plasma     Status: None   Collection Time: 06/01/20  7:51 PM  Result Value Ref Range   Lactic Acid, Venous 0.9 0.5 - 1.9 mmol/L    Comment: Performed at [place] [address], [place], [place] [address]  Glucose, capillary     Status: Abnormal   Collection Time: 06/01/20  9:43 PM  Result Value Ref Range   Glucose-Capillary 130 (H) 70 - 99 mg/dL    Comment: Glucose reference range applies only to samples taken after fasting for at least 8 hours.  Basic metabolic panel     Status: Abnormal   Collection Time: 06/02/20 12:35 AM  Result Value Ref Range   Sodium 137 135 - 145 mmol/L   Potassium 2.8 (L) 3.5 - 5.1 mmol/L   Chloride 104 98 - 111 mmol/L   CO2 21 (L) 22 - 32 mmol/L   Glucose, Bld 126 (H) 70 - 99 mg/dL    Comment: Glucose reference range applies  only to samples taken after fasting for at least 8 hours.   BUN 18 8 - 23 mg/dL   Creatinine, Ser 0.34 (L) 0.61 - 1.24 mg/dL   Calcium 9.8 8.9 - 10.3 mg/dL   GFR calc non Af Amer >60 >60 mL/min   GFR calc Af Amer >60 >60 mL/min   Anion gap 12 5 - 15    Comment: Performed at [place] [address], [place], [place] [address]  CBC     Status: Abnormal   Collection Time: 06/02/20 12:35 AM  Result Value Ref Range   WBC 8.8 4.0 - 10.5 K/uL   RBC 3.08 (L) 4.22 - 5.81 MIL/uL   Hemoglobin 8.1 (L) 13.0 - 17.0 g/dL   HCT 25.6 (L) 39 - 52 %   MCV 83.1 80.0 - 100.0 fL   MCH 26.3 26.0 - 34.0 pg   MCHC 31.6 30.0 - 36.0 g/dL   RDW 14.3 11.5 - 15.5 %   Platelets 449 (H) 150 - 400 K/uL   nRBC 0.0 0.0 - 0.2 %    Comment: Performed at [facility], [place] [address], [place], [place] [address]  Lactic acid, plasma     Status: None   Collection Time: 06/02/20 12:35 AM  Result Value Ref Range   Lactic Acid, Venous 0.8 0.5 - 1.9 mmol/L    Comment: Performed at [place] [address] [place], [place] [address]  Glucose, capillary     Status: Abnormal   Collection Time: 06/02/20  8:03 AM  Result Value Ref Range   Glucose-Capillary 123 (H) 70 - 99 mg/dL    Comment: Glucose reference range applies only to samples taken after fasting for at least 8 hours.    DG Sacrum/Coccyx  Result Date: 06/01/2020 CLINICAL DATA:  Decubitus ulceration EXAM: SACRUM AND COCCYX - 1  VIEW COMPARISON:  None. FINDINGS: Lateral views obtained. There is air in the perineal region posteriorly which may well extend to the level of the coccyx. No bony destruction appreciable on lateral view. No fracture or diastasis. There is overlying bandage IMPRESSION: Air is seen in the perineal region on lateral view. Air appears to extend to the level of the coccyx, but no bony destruction is appreciable on lateral view. No fracture or diastasis. Note that CT could be most helpful to better assess the extent of  apparent decubitus ulceration as well as to better assess the cortex of the coccyx to exclude possible more subtle bony destruction not evident by radiography. Electronically Signed   By: Lowella Grip III M.D.   On: 06/01/2020 13:36   MR PELVIS WO CONTRAST  Result Date: 06/01/2020 CLINICAL DATA:  Altered mental status EXAM: MRI PELVIS WITHOUT CONTRAST TECHNIQUE: Multiplanar multisequence MR imaging of the pelvis was performed. No intravenous contrast was administered. COMPARISON:  None. FINDINGS: Urinary Tract: The visualized distal ureters appear unremarkable. A Foley catheter is seen within the bladder. Bowel: No bowel wall thickening or inflammatory changes are seen. There is a mildly dilated stool-filled rectum. Vascular/Lymphatic: No enlarged pelvic lymph nodes identified. No significant vascular findings. Reproductive: The visualized portion of the deep pelvis is grossly unremarkable. Other: Diffusely increased feathery signal seen within the muscles surrounding the pelvis and paraspinal musculature as well as the iliopsoas. Within the left gluteal musculature there appear to be several T2 bright multilocular fluid collections which extend to the overlying area of ulceration along the left gluteal fold with a sinus tract present. The largest collection measures 2.1 x 1.0 x 2.1 cm. There is overlying subcutaneous edema seen around the bilateral hips. Musculoskeletal: There is fluid seen within the disc space at L4-L5 with endplate reactive changes and question minimal cortical irregularity. There is a multilocular fluid collection extending from the right-sided disc space which measures approximately 1.7 x 1.1 x 3.2 cm. IMPRESSION: 1. Area of focal ulceration along the posterior left gluteal fold with a sinus tract and multilocular fluid collections within the left gluteal musculature, likely small intramuscular abscesses. 2. There is also findings that are suggestive discitis osteomyelitis at L4-L5  with a right paravertebral loculated fluid collection, likely small abscess measuring 1.7 x 1.1 x 3.2 cm. 3. Diffuse increased signal within the muscles surrounding the pelvis, consistent with myositis. Electronically Signed   By: Prudencio Pair M.D.   On: 06/01/2020 18:50   DG Chest Port 1 View  Result Date: 06/01/2020 CLINICAL DATA:  Fever.  Sacral wound EXAM: PORTABLE CHEST 1 VIEW COMPARISON:  May 25, 2020 FINDINGS: The lungs are clear. The heart size and pulmonary vascularity are normal. No adenopathy. There is aortic atherosclerosis. No bone lesions. IMPRESSION: Lungs clear.  Cardiac silhouette within normal limits. Aortic Atherosclerosis (ICD10-I70.0). Electronically Signed   By: Lowella Grip III M.D.   On: 06/01/2020 13:36    ROS 10 point review of systems is negative except as listed above in HPI.   Physical Exam Blood pressure (!) 105/46, pulse 75, temperature 98.1 F (36.7 C), temperature source Oral, resp. rate 16, height 6' (1.829 m), weight 108 kg, SpO2 99 %. Constitutional: well-developed, appears well-nourished HEENT: pupils equal, round, reactive to light, 63mm b/l, moist conjunctiva, external inspection of ears and nose normal, hearing intact Oropharynx: normal oropharyngeal mucosa, poor dentition Neck: no thyromegaly, trachea midline, no midline cervical tenderness to palpation Chest: breath sounds equal bilaterally, normal respiratory effort, no midline or lateral chest wall tenderness to palpation/deformity Abdomen: soft, NT, cholecystectomy scar, no bruising, no hepatosplenomegaly GU: no blood at urethral meatus of penis, no scrotal masses or abnormality, open RIH scar Back: no wounds, no thoracic/lumbar spine tenderness to palpation, no thoracic/lumbar spine stepoffs Rectal: incontinent Extremities: 2+ radial and pedal pulses bilaterally, motor and sensation intact to bilateral UE, no motor of b/l LE, + peripheral edema MSK: wheelchair bound, so no gait/station, no  clubbing/cyanosis of fingers/toes, normal ROM of b/l UE, no motor of b/l  LE Skin: warm, dry, no rashes Psych: poor memory, normal mood/affect Neuro: oriented to self and place    Assessment/Plan: 59M with large stage 4 sacral decubitus ulcer with obvious fecal contamination and insensate.   Complex problem as patient's incontinence will inhibit ability to maintain cleanliness of wound and will prohibit wound healing. Also has failure to thrive with poor nutritional intake. Currently DNR. Discussed extensively with the patient's wife regarding all this and offered debridement of his sacral wound +/-  diverting colostomy due to his incontinence, highly recommend colostomy. Given FTT and already being in the abdomen, also offered g-tube placement. There is no offer for g-tube placement without debridement +/- ostomy. Would need cardiac risk stratification prior to surgery if she desires surgery, although opportunities for cardiac optimization are limited. DNR would be rescinded while he is in the operating room and she verbalized understanding. She seems to have limited social support and desires to revisit this conversation on 9/15 to allow her the opportunity to discuss with a church member. Okay to have a diet until midnight, but with current swallowing issues, defer diet order to primary team. Notification of multi-disciplinary team members regarding this.    Jesusita Oka, MD General and [facility]

## 2020-06-02 NOTE — Telephone Encounter (Signed)
[place] [phone] and spoke with Delsa Sale (receptionist). She transferred me to nurse in pt hall. Phone continued to ring. I called back and asked that Delsa Sale have nurse call me back regarding patient. Provided office number and my name.  Wanting to know if labs were completed after orders were sent to them on 05/21/20.

## 2020-06-02 NOTE — Progress Notes (Signed)
PROGRESS NOTE    Brent [name].  ESP:[id number] DOB: 1946-07-31 DOA: 06/01/2020 PCP: [name], [name], MD    Brief Narrative:  Patient was admitted to the hospital with a working diet gnosis dysphagia/odynophagia and anorexia, complicated by sacral wound abscess.  74 year old male with a past medical history of paraplegia due to transverse myelitis. He also has hypertension, dyslipidemia, type 2 diabetes mellitus, and coronary artery disease. Recently diagnosed with transverse myelitis 07/21, received steroids, IVIG and rituximab. Patient received therapy at inpatient rehab but unfortunately his overall health has been rapidly declining, due to very poor oral intake. Positive dysphagia, odynophagia, and significant decreased p.o. intake. Positive bowel and urinary incontinence. Due to worsening p.o. intake and failure to thrive he was brought to the hospital. On his initial physical examination blood pressure 115/46, heart rate 86, respirate 23, oxygen saturation 98%, he had dry mucous membranes, his lungs were clear to auscultation bilaterally, heart S1-S2, present rhythmic, abdomen soft, he had large sacral decubitus wound lesions, positive paraplegia. Sodium 135, potassium 2.7, chloride 99, bicarb 22, glucose 117, BUN 23, creatinine 0.4, magnesium 1.8, AST 62, ALT 68, white count 9.7, hemoglobin 9.9, hematocrit 32.7, platelets 449. SARS COVID-19 negative. Chest radiograph with no infiltrates. EKG 83 bpm, normal axis, normal intervals, sinus rhythm, poor R wave progression, no ST segment or T wave changes.  Pelvis MRI with area of focal ulceration along the posterior left gluteal fold with a sinus tract and multilocular fluid collections within the left gluteal muscle/ intramuscular abscess.  L4-L5 discitis osteomyelitis right paravertebral loculated fluids collection with small abscess 1.7x 1.1 x 3,2 cm.   Assessment & Plan:   Principal Problem:   Abscess, gluteal, left Active  Problems:   Hyperlipidemia   Essential hypertension   Transverse myelitis (HCC)   Sacral ulcer, with necrosis of muscle (HCC)   FTT (failure to thrive) in adult   Palliative care by specialist   Goals of care, counseling/discussion   Discitis of thoracic region   Osteomyelitis ([place])   1. Left gluteal abscess with L4-L5 discitis and osteomyelitis. Patient reports persistent moderate back pain. Wbc id 8,8, blood culture positive for Klebsiella pneumonia.   Continue antibiotic therapy with cefepime/ vancomycin, pain control with acetaminophen and tramadol. Will add morphine IV for severe pain control.    Follow with surgical recommendations for possible debridement, diverting colostomy and G tube placement.  His cardiac revised index is class 1 (low risk from cardiovascular perspective), but he does have increase risk for pulmonary and neurologic post operative complications.  Patient is non ambulatory due to paraplegia.   2. Transverse myelitis with paraplegia and swallow dysfunction/ bilateral heal and ankle pressure ulcers, not able to stage (present on admission). Patient with very poor oral intake, very weak and deconditioned.  Will consult speech therapy and follow with GI recommendations. Continue antiacid therapy with pantoprazole. Patient has received treatment for candida esophagitis with nystatin, now placed on fluconazole.  Continue with dysphagia 3 diet. Continue with dronabinol.   Continue local wound care to lower extremities wounds.   3. HTN/ dyslipidemia. Blood pressure has remained stable, continue to hold on antihypertensive medications. On atorvastatin.   4. Hypokalemia. Persistent hypokalemia, serum K is 2,8 with cr at 0,34 and bicarb at 21. Will continue K correction with Kcl IV and will follow up mg in am.  Continue IV fluids with balanced electrolyte solutions.   5. Controlled T2DM Hgb A1c is 6,6. Continue glucose cover and monitoring with insulin sliding  scale,  patient with very poor oral intake.  Patient continue to be at high risk for worsening abscess   Status is: Inpatient  Remains inpatient appropriate because:IV treatments appropriate due to intensity of illness or inability to take PO   Dispo: The patient is from: Home              Anticipated d/c is to: SNF              Anticipated d/c date is: 3 days              Patient currently is not medically stable to d/c.   DVT prophylaxis: Enoxaparin   Code Status:   full  Family Communication:  No family at the bedside      Nutrition Status:           Skin Documentation: Pressure Injury 06/01/20 Sacrum Right;Left;Mid Stage 4 - Full thickness tissue loss with exposed bone, tendon or muscle. Stage 4 Pressure Injury with Tunneling (Active)  06/01/20 1958  Location: Sacrum  Location Orientation: Right;Left;Mid  Staging: Stage 4 - Full thickness tissue loss with exposed bone, tendon or muscle.  Wound Description (Comments): Stage 4 Pressure Injury with Tunneling  Present on Admission: Yes     Pressure Injury 06/01/20 Ankle Anterior;Left Deep Tissue Pressure Injury - Purple or maroon localized area of discolored intact skin or blood-filled blister due to damage of underlying soft tissue from pressure and/or shear. (Active)  06/01/20 2050  Location: Ankle  Location Orientation: Anterior;Left  Staging: Deep Tissue Pressure Injury - Purple or maroon localized area of discolored intact skin or blood-filled blister due to damage of underlying soft tissue from pressure and/or shear.  Wound Description (Comments):   Present on Admission: Yes     Pressure Injury 06/01/20 Ankle Anterior;Right Deep Tissue Pressure Injury - Purple or maroon localized area of discolored intact skin or blood-filled blister due to damage of underlying soft tissue from pressure and/or shear. (Active)  06/01/20 2051  Location: Ankle  Location Orientation: Anterior;Right  Staging: Deep Tissue Pressure Injury -  Purple or maroon localized area of discolored intact skin or blood-filled blister due to damage of underlying soft tissue from pressure and/or shear.  Wound Description (Comments):   Present on Admission: Yes     Pressure Injury 06/01/20 Heel Right Deep Tissue Pressure Injury - Purple or maroon localized area of discolored intact skin or blood-filled blister due to damage of underlying soft tissue from pressure and/or shear. (Active)  06/01/20 2051  Location: Heel  Location Orientation: Right  Staging: Deep Tissue Pressure Injury - Purple or maroon localized area of discolored intact skin or blood-filled blister due to damage of underlying soft tissue from pressure and/or shear.  Wound Description (Comments):   Present on Admission: Yes     Pressure Injury 06/01/20 Heel Left Deep Tissue Pressure Injury - Purple or maroon localized area of discolored intact skin or blood-filled blister due to damage of underlying soft tissue from pressure and/or shear. (Active)  06/01/20 2052  Location: Heel  Location Orientation: Left  Staging: Deep Tissue Pressure Injury - Purple or maroon localized area of discolored intact skin or blood-filled blister due to damage of underlying soft tissue from pressure and/or shear.  Wound Description (Comments):   Present on Admission: Yes     Consultants:   Surgery   Procedures:     Antimicrobials:   Vancomycin and Zosyn     Subjective: Patient poorly reactive, positive pain at the back, not  able to get more detailed information, no nausea or vomiting.   Objective: Vitals:   06/01/20 2110 06/02/20 0136 06/02/20 0518 06/02/20 1005  BP: 132/78 (!) 110/44 (!) 105/46 (!) 122/51  Pulse: 83 77 75 77  Resp: 18 15 16 16   Temp: 98.4 F (36.9 C) 99.8 F (37.7 C) 98.1 F (36.7 C) 98.6 F (37 C)  TempSrc: Oral Oral Oral Oral  SpO2: 100% 99% 99% 99%  Weight:      Height:        Intake/Output Summary (Last 24 hours) at 06/02/2020 1253 Last data filed  at 06/02/2020 0900 Gross per 24 hour  Intake 3301.92 ml  Output 1100 ml  Net 2201.92 ml   Filed Weights   06/01/20 1400  Weight: 108 kg    Examination:   General: deconditioned and ill looking appearing  Neurology: Awake and alert, hyporeactive, following commands, positive paraplegia.   E ENT: positive pallor, no icterus, oral mucosa dry Cardiovascular: No JVD. S1-S2 present, rhythmic, no gallops, rubs, or murmurs. No lower extremity edema. Pulmonary: positive breath sounds bilaterally, decreases inspiratory effort Gastrointestinal. Abdomen soft and non tender Skin. No rashes Musculoskeletal: no joint deformities     Data Reviewed: I have personally reviewed following labs and imaging studies  CBC: Recent Labs  Lab 06/01/20 1248 06/02/20 0035  WBC 9.7 8.8  NEUTROABS 8.6*  --   HGB 9.9* 8.1*  HCT 32.7* 25.6*  MCV 83.6 83.1  PLT 499* 914*   Basic Metabolic Panel: Recent Labs  Lab 06/01/20 1248 06/01/20 1911 06/01/20 1951 06/02/20 0035  NA 135  --  136 137  K 2.7*  --  2.5* 2.8*  CL 99  --  104 104  CO2 22  --  22 21*  GLUCOSE 170*  --  129* 126*  BUN 23  --  17 18  CREATININE 0.40*  --  0.31* 0.34*  CALCIUM 10.2  --  9.8 9.8  MG 1.8  --   --   --   PHOS  --  3.1  --   --    GFR: Estimated Creatinine Clearance: 101.3 mL/min (A) (by C-G formula based on SCr of 0.34 mg/dL (L)). Liver Function Tests: Recent Labs  Lab 06/01/20 1248  AST 62*  ALT 68*  ALKPHOS 134*  BILITOT 0.7  PROT 5.3*  ALBUMIN 1.5*   No results for input(s): LIPASE, AMYLASE in the last 168 hours. No results for input(s): AMMONIA in the last 168 hours. Coagulation Profile: No results for input(s): INR, PROTIME in the last 168 hours. Cardiac Enzymes: No results for input(s): CKTOTAL, CKMB, CKMBINDEX, TROPONINI in the last 168 hours. BNP (last 3 results) No results for input(s): PROBNP in the last 8760 hours. HbA1C: Recent Labs    06/01/20 1248  HGBA1C 6.6*   CBG: Recent  Labs  Lab 06/01/20 2143 06/02/20 0803 06/02/20 1209  GLUCAP 130* 123* 79   Lipid Profile: No results for input(s): CHOL, HDL, LDLCALC, TRIG, CHOLHDL, LDLDIRECT in the last 72 hours. Thyroid Function Tests: No results for input(s): TSH, T4TOTAL, FREET4, T3FREE, THYROIDAB in the last 72 hours. Anemia Panel: No results for input(s): VITAMINB12, FOLATE, FERRITIN, TIBC, IRON, RETICCTPCT in the last 72 hours.    Radiology Studies: I have reviewed all of the imaging during this hospital visit personally     Scheduled Meds: . (feeding supplement) PROSource Plus  30 mL Oral BID BM  . antiseptic oral rinse  10 mL Mouth Rinse BID  . vitamin  C  500 mg Oral Daily  . aspirin EC  81 mg Oral QHS  . atorvastatin  20 mg Oral Daily  . Chlorhexidine Gluconate Cloth  6 each Topical Daily  . cholestyramine  4 g Oral Daily  . diclofenac Sodium  2 g Topical BID  . dronabinol  2.5 mg Oral BID AC  . empagliflozin  10 mg Oral Daily  . enoxaparin (LOVENOX) injection  40 mg Subcutaneous Q24H  . fluconazole  100 mg Oral Daily  . fluconazole  200 mg Oral Once  . fluticasone  2 spray Each Nare BID  . gabapentin  100 mg Oral Daily  . gabapentin  300 mg Oral QHS  . icosapent Ethyl  2 g Oral BID  . insulin aspart  0-15 Units Subcutaneous TID WC  . lidocaine  1 patch Transdermal Q24H  . lisinopril  2.5 mg Oral Daily  . multivitamin with minerals  1 tablet Oral Daily  . Muscle Rub  1 application Topical BID  . ondansetron  4 mg Oral BID  . oxybutynin  5 mg Oral Daily  . pantoprazole  40 mg Oral Daily  . potassium chloride  40 mEq Oral Once  . saccharomyces boulardii  250 mg Oral BID  . sertraline  150 mg Oral QHS  . tamsulosin  0.4 mg Oral QPC supper  . vitamin B-12  500 mcg Oral Daily  . zinc sulfate  220 mg Oral Daily   Continuous Infusions: . albumin human 12.5 g (06/02/20 0939)  . ceFEPime (MAXIPIME) IV 2 g (06/02/20 0617)  . lactated ringers 75 mL/hr at 06/02/20 0931  . metronidazole 500  mg (06/02/20 0937)  . potassium chloride 10 mEq (06/02/20 1204)  . vancomycin Stopped (06/02/20 1021)     LOS: 1 day        Jayleena Stille Gerome Apley, MD

## 2020-06-02 NOTE — Consult Note (Signed)
WOC Nurse Consult Note: Patient receiving care in [facility] 6N10.  Primary RN at bedside at time of assessment.  Flexiseal had just been inserted. Reason for Consult: sacral wound Wound type: unstageable, highly necrotic Pressure Injury POA: Yes Measurement: 11 cm x 15 cm Wound bed: 100% brown and yellow slough Drainage (amount, consistency, odor) none observed on newly placed dressing Periwound: periwound erythema Dressing procedure/placement/frequency:  Twice daily insertion of saline moistened gauze covered with foam dressing. I have also added a bed with low air loss feature. Thank you for the consult.  Discussed plan of care with the patient and bedside nurse.  [place] nurse will not follow at this time.  Please re-consult the [place] team if needed.  Val Riles, RN, MSN, CWOCN, CNS-BC, pager [phone]

## 2020-06-03 DIAGNOSIS — L0231 Cutaneous abscess of buttock: Secondary | ICD-10-CM

## 2020-06-03 DIAGNOSIS — M4644 Discitis, unspecified, thoracic region: Secondary | ICD-10-CM

## 2020-06-03 LAB — BASIC METABOLIC PANEL
Anion gap: 10 (ref 5–15)
Anion gap: 9 (ref 5–15)
BUN: 7 mg/dL — ABNORMAL LOW (ref 8–23)
BUN: 5 mg/dL — ABNORMAL LOW (ref 8–23)
CO2: 21 mmol/L — ABNORMAL LOW (ref 22–32)
CO2: 22 mmol/L (ref 22–32)
Calcium: 9.5 mg/dL (ref 8.9–10.3)
Calcium: 9.5 mg/dL (ref 8.9–10.3)
Chloride: 107 mmol/L (ref 98–111)
Chloride: 107 mmol/L (ref 98–111)
Creatinine, Ser: 0.33 mg/dL — ABNORMAL LOW (ref 0.61–1.24)
Creatinine, Ser: <0.3 mg/dL — ABNORMAL LOW (ref 0.61–1.24)
GFR calc Af Amer: >60 mL/min (ref >60)
GFR calc non Af Amer: >60 mL/min (ref >60)
Glucose, Bld: 89 mg/dL (ref 70–99)
Glucose, Bld: 91 mg/dL (ref 70–99)
Potassium: 2.4 mmol/L — CL (ref 3.5–5.1)
Potassium: 3.1 mmol/L — ABNORMAL LOW (ref 3.5–5.1)
Sodium: 138 mmol/L (ref 135–145)
Sodium: 138 mmol/L (ref 135–145)

## 2020-06-03 LAB — GLUCOSE, CAPILLARY
Glucose-Capillary: 88 mg/dL (ref 70–99)
Glucose-Capillary: 78 mg/dL (ref 70–99)
Glucose-Capillary: 79 mg/dL (ref 70–99)
Glucose-Capillary: 85 mg/dL (ref 70–99)

## 2020-06-03 LAB — MAGNESIUM: Magnesium: 1.4 mg/dL — ABNORMAL LOW (ref 1.7–2.4)

## 2020-06-03 MED ORDER — POTASSIUM CHLORIDE 10 MEQ/100ML IV SOLN
10.0000 meq | INTRAVENOUS | Status: AC
  Administered 2020-06-03 (×4): 10 meq via INTRAVENOUS
  Filled 2020-06-03 (×4): qty 100

## 2020-06-03 MED ORDER — FENTANYL CITRATE (PF) 100 MCG/2ML IJ SOLN
25.0000 ug | Freq: Once | INTRAMUSCULAR | Status: AC
  Administered 2020-06-03: 25 ug via INTRAVENOUS
  Filled 2020-06-03: qty 2

## 2020-06-03 MED ORDER — MAGNESIUM SULFATE 2 GM/50ML IV SOLN
2.0000 g | Freq: Once | INTRAVENOUS | Status: AC
  Administered 2020-06-03: 2 g via INTRAVENOUS
  Filled 2020-06-03 (×2): qty 50

## 2020-06-03 MED ORDER — LIP MEDEX EX OINT
TOPICAL_OINTMENT | CUTANEOUS | Status: DC | PRN
  Filled 2020-06-03: qty 7

## 2020-06-03 NOTE — Progress Notes (Signed)
Daily Progress Note   Patient Name: Brent [name].       Date: 06/03/2020 DOB: [birth year]  Age: 74 y.o. MRN#: [id number] Attending Physician: Mariel Aloe, MD Primary Care Physician: [name], [name], MD Admit Date: 06/01/2020 HPI/Patient Profile: 74 y.o. male  with past medical history of recently diagnosed transverse myelitis and paraplegia secondary to transverse myelitis admitted on 06/01/2020 with difficulty eating and failure to thrive. 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 Patient then went to inpatient rehab 7/7-8/6 with some improvement in functional status but continued to require extensive care. He was discharged to [facility] on 8/6.  He presented to the ED 9/6 with painful swallowing, discharged with diagnosis of dehydration and dry mouth. His nutritional status and appetite has continued to decline since that time. Patient reports swallowing is painful in the back of his mouth.  ED course: Potassium 2.7, albumin 1.5, sacral x-ray showed gas in surrounding tissue and inflammation in the muscle. MRI concerning for left gluteal abscess and doscitis osteomyelitis at L4-L5.   Surgery has been consulted and offered debridement of sacral wound +/- diverting colostomy due to incontinence. Given FTT, also offered g-tube placement.   Subjective: Patient is alert and oriented. No family at bedside.  During my initial consult, patient was lethargic and I was only able to discuss GOC with wife/Jean, so was hoping to confirm [place] with patient himself.   I introduced Palliative Medicine as specialized medical care for  people living with serious illness. It focuses on providing relief from the symptoms and stress of a serious illness.   We discuss his diagnosis his current diaonoses and what it means in the larger context of his ongoing health conditions. Patient has realistic expectations that we will likely not regain the ability ambulate. We discuss his complications from prolonged immobility including his large sacral wound. He is hopeful the planned surgical intervention will promote healing of this wound.  I attempted to elicit values and goals of care important to the patient. He is hopeful "to get well", which he defines as strong enough to be able to utilize the power wheelchair. He is also hopeful at return home at  some point. He wants to be present to help raise his grandson/Trevor.   The difference between aggressive medical intervention and comfort care was considered in light of the patient's goals of care. The patient is interested in all aggressive medical interventions offered. We did discuss code status. Encouraged patient to consider DNR/DNI status understanding evidenced based poor outcomes in similar hospitalized patients, as the cause of the arrest is likely associated with chronic/terminal disease rather than a reversible acute cardio-pulmonary event. Patient wishes to remain full code at this time. However, he would not want to be prolonged on mechanical ventilation.  Length of Stay: 2  Current Medications: Scheduled Meds:   (feeding supplement) PROSource Plus  30 mL Oral BID BM   antiseptic oral rinse  10 mL Mouth Rinse BID   vitamin C  500 mg Oral Daily   aspirin EC  81 mg Oral QHS   atorvastatin  20 mg Oral Daily   Chlorhexidine Gluconate Cloth  6 each Topical Daily   cholestyramine  4 g Oral Daily   diclofenac Sodium  2 g Topical BID   dronabinol  2.5 mg Oral BID AC   enoxaparin (LOVENOX) injection  40 mg Subcutaneous Q24H   feeding supplement (ENSURE ENLIVE)  237 mL  Oral BID BM   fluconazole  100 mg Oral Daily   fluticasone  2 spray Each Nare BID   gabapentin  100 mg Oral Daily   gabapentin  300 mg Oral QHS   icosapent Ethyl  2 g Oral BID   [START ON 06/08/2020] influenza vaccine adjuvanted  0.5 mL Intramuscular Once   insulin aspart  0-15 Units Subcutaneous TID WC   lidocaine  1 patch Transdermal Q24H   multivitamin with minerals  1 tablet Oral Daily   Muscle Rub  1 application Topical BID   ondansetron  4 mg Oral BID   oxybutynin  5 mg Oral Daily   pantoprazole  40 mg Oral Daily   saccharomyces boulardii  250 mg Oral BID   sertraline  150 mg Oral QHS   tamsulosin  0.4 mg Oral QPC supper   vitamin B-12  500 mcg Oral Daily   zinc sulfate  220 mg Oral Daily    Continuous Infusions:  ceFEPime (MAXIPIME) IV Stopped (06/03/20 1307)   lactated ringers 75 mL/hr at 06/03/20 1642   magnesium sulfate bolus IVPB 2 g (06/03/20 1723)   metronidazole 500 mg (06/03/20 1706)   potassium chloride 10 mEq (06/03/20 1704)   vancomycin Stopped (06/03/20 1502)    PRN Meds: acetaminophen, albuterol, bisacodyl, lidocaine, traMADol  Physical Exam Vitals reviewed.  Constitutional:      General: He is not in acute distress.    Appearance: He is ill-appearing.  Pulmonary:     Effort: Pulmonary effort is normal.  Skin:    Comments: Large sacral wound  Neurological:     Mental Status: He is alert and oriented to person, place, and time.     Comments: paraplegia             Vital Signs: BP 124/65 (BP Location: Left Arm)    Pulse 76    Temp 97.6 F (36.4 C) (Oral)    Resp 20    Ht 6' (1.829 m)    Wt 108 kg    SpO2 94%    BMI 32.28 kg/m  SpO2: SpO2: 94 % O2 Device: O2 Device: Room Air O2 Flow Rate:    Intake/output summary:   Intake/Output Summary (Last 24  hours) at 06/03/2020 1728 Last data filed at 06/03/2020 1642 Gross per 24 hour  Intake 3014.94 ml  Output 1400 ml  Net 1614.94 ml   LBM: Last BM Date: 06/02/20 (pt oozing  stool from rectum ) Baseline Weight: Weight: 108 kg Most recent weight: Weight: 108 kg       Palliative Assessment/Data: 30%      Palliative Care Assessment & Plan   Assessment: - 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) - paraplegia secondary to transverse myelitis - Left gluteal abscess with L4-L5 discitis and osteomyelitis - dysphagia - failure to thrive, malnutrition  Recommendations/Plan: - patient confirms he wishes full code status and full scope of treatment - continue current plan of care - patient would not want to be prolonged on mechanical ventilation - continue fluconazole 100 mg daily (for possible esophageal candida)  No additional PMT needs at this time, please call [phone] if we can be of additional assistance or need to actively re-engage with this patient and family  Goals of Care and Additional Recommendations:  Limitations on Scope of Treatment: Full Scope Treatment  Code Status: Full code  Prognosis: Difficult to determine, but likely poor in the setting of myelitis, paraplegia, sacral wound, and failure to thrive  Discharge Planning:  To Be Determined  Care plan was discussed with bedside RN  Thank you for allowing the Palliative Medicine Team to assist in the care of this patient.   Total Time 35 minutes Prolonged Time Billed  no       Greater than 50%  of this time was spent counseling and coordinating care related to the above assessment and plan.  Lavena Bullion, NP  Please contact Palliative Medicine Team phone at [phone] for questions and concerns.

## 2020-06-03 NOTE — Progress Notes (Signed)
   06/03/20 1237  Clinical Encounter Type  Visited With Patient  Visit Type Initial;Spiritual support  Referral From Physician  Consult/Referral To Chaplain  Stress Factors  Patient Stress Factors Health changes (Communication)  Family Stress Factors Health changes  This chaplain responded to MD consult for spiritual care.  The Pt. is awakened by the chaplain and maintains a pleasant affect throughout the visit.  The Pt. shared the concern that his wife is at home caring for the  Pt. grandson who is COVID+.  The Pt. states he is unaware of today's plan of care and desires more communication about his care from the healthcare team.  Per the Pt. request the chaplain gave the Pt.a damp washcloth to lay on his dry lips and showed the Pt. how to use the remote.  When the Pt. pushed the nurse call button, the Pt. asked to be returned to his room. The chaplain will update the PMT and F/U with spiritual care as needed.

## 2020-06-03 NOTE — Progress Notes (Signed)
Initial Nutrition Assessment  DOCUMENTATION CODES:   Obesity unspecified  INTERVENTION:   -RD will follow for diet advancement and supplement as appropriate -If g-tube is placed, recommend:  Initiate Osmolite 1.5 1.5 @ 20 ml/hr and increase by 10 ml every 8 hours to goal rate of 60 ml/hr.   90 ml Prosource Plus BID.    Tube feeding regimen provides 2280 kcal (100% of needs), 134 grams of protein, and 1097 ml of H2O.   -If feedings are started, recommend monitoring Mg, K, and Phos daily x 3 days  NUTRITION DIAGNOSIS:   Increased nutrient needs related to wound healing as evidenced by estimated needs.  GOAL:   Patient will meet greater than or equal to 90% of their needs  MONITOR:   PO intake, Supplement acceptance, Diet advancement, Labs, Weight trends, Skin, I & O's  REASON FOR ASSESSMENT:   Malnutrition Screening Tool    ASSESSMENT:   Brent [name]. is a 74 y.o. male with medical history significant of recent diagnosis of transverse myelitis status post rituximab, paraplegia secondary to transverse myelitis, HTN, HLD, IDDM, CAD, presented with trouble eating and failure to thrive.  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  Patient then underwent inpatient rehab but his further recovery has been hampered by his nutrition status.  Looks like patient developed mild dysphagia and dryness in the mouth on last admission, patient told me that has never went away.  Since last week, his swallow condition worsened, same time, his appetite also dropped significantly.  He told me that even swallow at the point water caused "soreness on the back of the mouth".  Pt admitted with FTT, dysphagia, odynophagia, and anorexia.   9/14- rectal tube placed, s/p BSE- recommending full liquid  diet  Reviewed I/O's: -240 ml x 24 hours and +2 L since admission  UOP: 2.4 L x 24 hours  Attempted to speak with pt, however, in with MD at time of visit. RD unable to obtain further nutrition-related history or complete nutrition-focused physical exam at this time.   Per chart review, pt has had a decreased appetite and xerostomia. PTA pt was consumung mechanical soft diet and thin liquids, but with poor oral intake. Intake remains poor this hospitalization; noted meal completion 25%.  Per H&P, symptoms have declined over the past 2 weeks.   Per wt hx; wt has been stable over the past 9 months.  Pt currently NPO for possible debridement of sacral wound with diverting colostomy and g-tube placement.   Pt is at high risk for malnutrition, however, unable to identify at this time.   Medications reviewed and include vitamin C, marinol, diflucan, florastor, vitamin B-12, lactated ringers infusion @ 75 ml/hr, and zinc sulfate.   Palliative care following for continues goals of care discussions.   Labs reviewed: K: 2.4 (on IV supplementation) CBGS: 68-88 (inpatient orders for glycemic control are 0-15 units insulin aspart TID with meals).   Diet Order:   Diet Order            Diet NPO time specified  Diet effective midnight                 EDUCATION NEEDS:   No education needs have been identified at this time  Skin:  Skin Assessment: Skin Integrity Issues: Skin Integrity Issues:: DTI, Unstageable DTI:  lt heel Unstageable: sacrum, highly necrotic  Last BM:  06/02/20 (via rectal tube)  Height:   Ht Readings from Last 1 Encounters:  06/01/20 6' (1.829 m)    Weight:   Wt Readings from Last 1 Encounters:  06/01/20 108 kg    Ideal Body Weight:  74.8 kg (adjusted for paraplegia)  BMI:  Body mass index is 32.28 kg/m.  Estimated Nutritional Needs:   Kcal:  4847-2072  Protein:  125-150 grams  Fluid:  > 2 L    Loistine Chance, RD, LDN, [place] Registered Dietitian  II Certified Diabetes Care and Education Specialist Please refer to [facility] for RD and/or RD on-call/weekend/after hours pager

## 2020-06-03 NOTE — Progress Notes (Signed)
PROGRESS NOTE    Brent [name].  ACZ:[id number] DOB: 1945-10-06 DOA: 06/01/2020 PCP: Brent [name], Brent Halsted, MD   Brief Narrative: Brent [name]. is a 74 y.o. male with a history of transverse myelitis status post rituximab, paraplegia secondary to transverse myelitis, hypertension, hyperlipidemia, insulin-dependent diabetes mellitus, CAD.  Patient presented secondary to trouble eating and failure to thrive and was found to have a left gluteal abscess in addition to discitis/osteomyelitis on imaging.  Patient started empirically on antibiotics for management in addition to general surgery consult for debridement of sacral wound/abscess.  Patient started empirically on IV antibiotics.   Assessment & Plan:   Principal Problem:   Abscess, gluteal, left Active Problems:   Hyperlipidemia   Essential hypertension   Transverse myelitis (HCC)   Sacral ulcer, with necrosis of muscle (HCC)   FTT (failure to thrive) in adult   Palliative care by specialist   Goals of care, counseling/discussion   Discitis of thoracic region   Osteomyelitis ([place])   Left gluteal abscess/necrotic sacral wound L4-L5 discitis/osteomyelitis Gram-negative rod bacteremia General surgery consulted for debridement.  Blood cultures significant for gram-negative rods in 1 out of 4 samples.  Unlikely that this is a contaminant in setting of gluteal abscess and discitis. -Continue cefepime and vancomycin -We will obtain ID consult in a.m. -General surgery recommendations: Plan for debridement in the OR with diverting colostomy in addition to placement of G-tube  History of transverse myelitis with paraplegia -PT/OT  Dysphagia Esophagram performed on 9/14 without evidence of esophageal dysmotility. Likely oral phase disability. Speech therapy consulted. Complicated by Xerostomia. GI consulted who recommends ENT consult. -Will discuss with ENT in AM  Failure to thrive In setting of above. Plan for  G-tube placement per general surgery. -Continue LR -General surgery recommendations for G-tube placement -If no G tube placement soon, will place Cortrak tube for tube feeds  Abdominal nodule Unknown etiology. Patient states he has had it for some time. Otherwise unremarkable.  Hyponatremia -Continue IV potassium supplementation while NPO  Hypomagnesemia Contributing to above -Replete with IV magnesium sulfate  Diabetes mellitus, type II Hemoglobin A1c of 6.6%. -Continue sliding scale insulin  Hyperlipidemia -Continue atorvastatin  Essential hypertension On lisinopril as an outpatient which was held on admission.  Blood pressure has been mostly controlled while inpatient.  Pressure injury Sacral, left anterior ankle, right anterior ankle, right heel, left heel.  All present on admission    DVT prophylaxis: Lovenox Code Status:   Code Status: Full Code Family Communication: Wife on telephone Disposition Plan: Discharge likely to SNF in several days pending management of sacral wound by general surgery in addition to management of infection with anticipated ID recommendations for outpatient antibiotic regimen   Consultants:   General surgery  Gastroenterology  Procedures:   None  Antimicrobials:  Vancomycin  Cefepime  Flagyl  Subjective: No concerns today other than wanting to have surgical management of his wound.  Objective: Vitals:   06/02/20 1005 06/02/20 1530 06/02/20 2044 06/03/20 0438  BP: (!) 122/51 (!) 108/39 (!) 119/56 (!) 124/52  Pulse: 77 76 81 77  Resp: 16 18 18 18   Temp: 98.6 F (37 C) 98.4 F (36.9 C) 99.6 F (37.6 C) 98.1 F (36.7 C)  TempSrc: Oral Axillary Oral Oral  SpO2: 99% 98% 98% 98%  Weight:      Height:        Intake/Output Summary (Last 24 hours) at 06/03/2020 0925 Last data filed at 06/03/2020 0438 Gross per  24 hour  Intake 2159.87 ml  Output 2400 ml  Net -240.13 ml   Filed Weights   06/01/20 1400  Weight: 108 kg     Examination:  General exam: Appears calm and comfortable Respiratory system: Clear to auscultation. Respiratory effort normal. Cardiovascular system: S1 & S2 heard, RRR. No murmurs, rubs, gallops or clicks. Gastrointestinal system: Abdomen is nondistended, soft and nontender.  Moderately sized firm mass located in the right upper quadrant that is nontender and nonfluctuant with no overlying erythema.  Normal bowel sounds heard. Central nervous system: Alert and oriented.. Musculoskeletal: No calf tenderness Skin: No cyanosis. No rashes Psychiatry: Judgement and insight appear normal. Mood & affect appropriate.     Data Reviewed: I have personally reviewed following labs and imaging studies  CBC Lab Results  Component Value Date   WBC 5.9 06/02/2020   RBC 2.82 (L) 06/02/2020   HGB 7.2 (L) 06/02/2020   HCT 23.4 (L) 06/02/2020   MCV 83.0 06/02/2020   MCH 25.5 (L) 06/02/2020   PLT 381 06/02/2020   MCHC 30.8 06/02/2020   RDW 14.1 06/02/2020   LYMPHSABS 0.6 (L) 06/02/2020   MONOABS 0.4 06/02/2020   EOSABS 0.0 06/02/2020   BASOSABS 0.0 33/29/5188     Last metabolic panel Lab Results  Component Value Date   NA 138 06/03/2020   K 2.4 (LL) 06/03/2020   CL 107 06/03/2020   CO2 21 (L) 06/03/2020   BUN 7 (L) 06/03/2020   CREATININE 0.33 (L) 06/03/2020   GLUCOSE 89 06/03/2020   GFRNONAA >60 06/03/2020   GFRAA >60 06/03/2020   CALCIUM 9.5 06/03/2020   PHOS 3.1 06/01/2020   PROT 5.3 (L) 06/01/2020   ALBUMIN 1.5 (L) 06/01/2020   LABGLOB 2.0 04/11/2019   AGRATIO 2.3 (H) 04/11/2019   BILITOT 0.7 06/01/2020   ALKPHOS 134 (H) 06/01/2020   AST 62 (H) 06/01/2020   ALT 68 (H) 06/01/2020   ANIONGAP 10 06/03/2020    CBG (last 3)  Recent Labs    06/02/20 2046 06/02/20 2116 06/03/20 0807  GLUCAP 68* 74 88     GFR: Estimated Creatinine Clearance: 101.3 mL/min (A) (by C-G formula based on SCr of 0.33 mg/dL (L)).  Coagulation Profile: No results for input(s): INR,  PROTIME in the last 168 hours.  Recent Results (from the past 240 hour(s))  Group A Strep by PCR     Status: None   Collection Time: 05/25/20  9:44 AM   Specimen: Throat; Sterile Swab  Result Value Ref Range Status   Group A Strep by PCR NOT DETECTED NOT DETECTED Final    Comment: Performed at [facility], [address] [place], [place] [address]  SARS Coronavirus 2 by RT PCR (hospital order, performed in [facility] hospital lab) Nasopharyngeal Nasopharyngeal Swab     Status: None   Collection Time: 05/25/20  1:15 PM   Specimen: Nasopharyngeal Swab  Result Value Ref Range Status   SARS Coronavirus 2 NEGATIVE NEGATIVE Final    Comment: (NOTE) SARS-CoV-2 target nucleic acids are NOT DETECTED.  The SARS-CoV-2 RNA is generally detectable in upper and lower respiratory specimens during the acute phase of infection. The lowest concentration of SARS-CoV-2 viral copies this assay can detect is 250 copies / mL. A negative result does not preclude SARS-CoV-2 infection and should not be used as the sole basis for treatment or other patient management decisions.  A negative result may occur with improper specimen collection / handling, submission of specimen other than  nasopharyngeal swab, presence of viral mutation(s) within the areas targeted by this assay, and inadequate number of viral copies (<250 copies / mL). A negative result must be combined with clinical observations, patient history, and epidemiological information.  Fact Sheet for Patients:   StrictlyIdeas.no  Fact Sheet for Healthcare Providers: BankingDealers.co.za  This test is not yet approved or  cleared by the [place] FDA and has been authorized for detection and/or diagnosis of SARS-CoV-2 by FDA under an Emergency Use Authorization (EUA).  This EUA will remain in effect (meaning this test can be used) for the duration of the COVID-19 declaration under Section  564(b)(1) of the Act, 21 U.S.C. section 360bbb-3(b)(1), unless the authorization is terminated or revoked sooner.  Performed at [facility], [place] [address] [place], [place] [address]   SARS Coronavirus 2 by RT PCR (hospital order, performed in [facility] hospital lab) Nasopharyngeal Nasopharyngeal Swab     Status: None   Collection Time: 06/01/20 12:24 PM   Specimen: Nasopharyngeal Swab  Result Value Ref Range Status   SARS Coronavirus 2 NEGATIVE NEGATIVE Final    Comment: (NOTE) SARS-CoV-2 target nucleic acids are NOT DETECTED.  The SARS-CoV-2 RNA is generally detectable in upper and lower respiratory specimens during the acute phase of infection. The lowest concentration of SARS-CoV-2 viral copies this assay can detect is 250 copies / mL. A negative result does not preclude SARS-CoV-2 infection and should not be used as the sole basis for treatment or other patient management decisions.  A negative result may occur with improper specimen collection / handling, submission of specimen other than nasopharyngeal swab, presence of viral mutation(s) within the areas targeted by this assay, and inadequate number of viral copies (<250 copies / mL). A negative result must be combined with clinical observations, patient history, and epidemiological information.  Fact Sheet for Patients:   StrictlyIdeas.no  Fact Sheet for Healthcare Providers: BankingDealers.co.za  This test is not yet approved or  cleared by the [place] FDA and has been authorized for detection and/or diagnosis of SARS-CoV-2 by FDA under an Emergency Use Authorization (EUA).  This EUA will remain in effect (meaning this test can be used) for the duration of the COVID-19 declaration under Section 564(b)(1) of the Act, 21 U.S.C. section 360bbb-3(b)(1), unless the authorization is terminated or revoked sooner.  Performed at [facility], [place] [address] [place], [place] [address]   Blood culture (routine x 2)     Status: None (Preliminary result)   Collection Time: 06/01/20 12:30 PM   Specimen: BLOOD  Result Value Ref Range Status   Specimen Description BLOOD SITE NOT SPECIFIED  Final   Special Requests   Final    BOTTLES DRAWN AEROBIC AND ANAEROBIC Blood Culture adequate volume   Culture   Final    NO GROWTH 1 DAY Performed at [facility], [place] [address] [place], [place] [address]    Report Status PENDING  Incomplete  Blood culture (routine x 2)     Status: None (Preliminary result)   Collection Time: 06/01/20 12:35 PM   Specimen: BLOOD  Result Value Ref Range Status   Specimen Description BLOOD SITE NOT SPECIFIED  Final   Special Requests   Final    BOTTLES DRAWN AEROBIC AND ANAEROBIC Blood Culture adequate volume   Culture  Setup Time   Final    GRAM NEGATIVE RODS AEROBIC BOTTLE ONLY CRITICAL RESULT CALLED TO, READ BACK BY AND VERIFIED WITH: PHARMD E.FULOIPEN  AT 1205 ON 06/02/2020 BY T.SAAD Performed at [facility], [place] [address] [place], [place] [address]    Culture GRAM NEGATIVE RODS  Final   Report Status PENDING  Incomplete  Blood Culture ID Panel (Reflexed)     Status: Abnormal   Collection Time: 06/01/20 12:35 PM  Result Value Ref Range Status   Enterococcus faecalis NOT DETECTED NOT DETECTED Final   Enterococcus Faecium NOT DETECTED NOT DETECTED Final   Listeria monocytogenes NOT DETECTED NOT DETECTED Final   Staphylococcus species NOT DETECTED NOT DETECTED Final   Staphylococcus aureus (BCID) NOT DETECTED NOT DETECTED Final   Staphylococcus epidermidis NOT DETECTED NOT DETECTED Final   Staphylococcus lugdunensis NOT DETECTED NOT DETECTED Final   Streptococcus species NOT DETECTED NOT DETECTED Final   Streptococcus agalactiae NOT DETECTED NOT DETECTED Final   Streptococcus pneumoniae NOT DETECTED NOT DETECTED Final   Streptococcus pyogenes NOT DETECTED NOT DETECTED Final    A.calcoaceticus-baumannii NOT DETECTED NOT DETECTED Final   Bacteroides fragilis NOT DETECTED NOT DETECTED Final   Enterobacterales DETECTED (A) NOT DETECTED Final    Comment: Enterobacterales represent a large order of gram negative bacteria, not a single organism. CRITICAL RESULT CALLED TO, READ BACK BY AND VERIFIED WITH: PHARMD E.FULOIPEN AT 1205 ON 06/02/2020 BY T.SAAD    Enterobacter cloacae complex NOT DETECTED NOT DETECTED Final   Escherichia coli NOT DETECTED NOT DETECTED Final   Klebsiella aerogenes NOT DETECTED NOT DETECTED Final   Klebsiella oxytoca NOT DETECTED NOT DETECTED Final   Klebsiella pneumoniae NOT DETECTED NOT DETECTED Final   Proteus species DETECTED (A) NOT DETECTED Final    Comment: CRITICAL RESULT CALLED TO, READ BACK BY AND VERIFIED WITH: PHARMD E.FULOIPEN AT 1205 ON 06/02/2020 BY T.SAAD    Salmonella species NOT DETECTED NOT DETECTED Final   Serratia marcescens NOT DETECTED NOT DETECTED Final   Haemophilus influenzae NOT DETECTED NOT DETECTED Final   Neisseria meningitidis NOT DETECTED NOT DETECTED Final   Pseudomonas aeruginosa NOT DETECTED NOT DETECTED Final   Stenotrophomonas maltophilia NOT DETECTED NOT DETECTED Final   Candida albicans NOT DETECTED NOT DETECTED Final   Candida auris NOT DETECTED NOT DETECTED Final   Candida glabrata NOT DETECTED NOT DETECTED Final   Candida krusei NOT DETECTED NOT DETECTED Final   Candida parapsilosis NOT DETECTED NOT DETECTED Final   Candida tropicalis NOT DETECTED NOT DETECTED Final   Cryptococcus neoformans/gattii NOT DETECTED NOT DETECTED Final   CTX-M ESBL NOT DETECTED NOT DETECTED Final   Carbapenem resistance IMP NOT DETECTED NOT DETECTED Final   Carbapenem resistance KPC NOT DETECTED NOT DETECTED Final   Carbapenem resistance NDM NOT DETECTED NOT DETECTED Final   Carbapenem resist OXA 48 LIKE NOT DETECTED NOT DETECTED Final   Carbapenem resistance VIM NOT DETECTED NOT DETECTED Final    Comment: Performed at  [facility], [address], [place], [place] [address]        Radiology Studies: DG Sacrum/Coccyx  Result Date: 06/01/2020 CLINICAL DATA:  Decubitus ulceration EXAM: SACRUM AND COCCYX - 1  VIEW COMPARISON:  None. FINDINGS: Lateral views obtained. There is air in the perineal region posteriorly which may well extend to the level of the coccyx. No bony destruction appreciable on lateral view. No fracture or diastasis. There is overlying bandage IMPRESSION: Air is seen in the perineal region on lateral view. Air appears to extend to the level of the coccyx, but no bony destruction is appreciable on lateral view. No fracture or diastasis. Note that  CT could be most helpful to better assess the extent of apparent decubitus ulceration as well as to better assess the cortex of the coccyx to exclude possible more subtle bony destruction not evident by radiography. Electronically Signed   By: Lowella Grip III M.D.   On: 06/01/2020 13:36   MR PELVIS WO CONTRAST  Result Date: 06/01/2020 CLINICAL DATA:  Altered mental status EXAM: MRI PELVIS WITHOUT CONTRAST TECHNIQUE: Multiplanar multisequence MR imaging of the pelvis was performed. No intravenous contrast was administered. COMPARISON:  None. FINDINGS: Urinary Tract: The visualized distal ureters appear unremarkable. A Foley catheter is seen within the bladder. Bowel: No bowel wall thickening or inflammatory changes are seen. There is a mildly dilated stool-filled rectum. Vascular/Lymphatic: No enlarged pelvic lymph nodes identified. No significant vascular findings. Reproductive: The visualized portion of the deep pelvis is grossly unremarkable. Other: Diffusely increased feathery signal seen within the muscles surrounding the pelvis and paraspinal musculature as well as the iliopsoas. Within the left gluteal musculature there appear to be several T2 bright multilocular fluid collections which extend to the overlying area of ulceration along the left  gluteal fold with a sinus tract present. The largest collection measures 2.1 x 1.0 x 2.1 cm. There is overlying subcutaneous edema seen around the bilateral hips. Musculoskeletal: There is fluid seen within the disc space at L4-L5 with endplate reactive changes and question minimal cortical irregularity. There is a multilocular fluid collection extending from the right-sided disc space which measures approximately 1.7 x 1.1 x 3.2 cm. IMPRESSION: 1. Area of focal ulceration along the posterior left gluteal fold with a sinus tract and multilocular fluid collections within the left gluteal musculature, likely small intramuscular abscesses. 2. There is also findings that are suggestive discitis osteomyelitis at L4-L5 with a right paravertebral loculated fluid collection, likely small abscess measuring 1.7 x 1.1 x 3.2 cm. 3. Diffuse increased signal within the muscles surrounding the pelvis, consistent with myositis. Electronically Signed   By: Prudencio Pair M.D.   On: 06/01/2020 18:50   DG Chest Port 1 View  Result Date: 06/01/2020 CLINICAL DATA:  Fever.  Sacral wound EXAM: PORTABLE CHEST 1 VIEW COMPARISON:  May 25, 2020 FINDINGS: The lungs are clear. The heart size and pulmonary vascularity are normal. No adenopathy. There is aortic atherosclerosis. No bone lesions. IMPRESSION: Lungs clear.  Cardiac silhouette within normal limits. Aortic Atherosclerosis (ICD10-I70.0). Electronically Signed   By: Lowella Grip III M.D.   On: 06/01/2020 13:36   DG ESOPHAGUS W SINGLE CM (SOL OR THIN BA)  Result Date: 06/02/2020 CLINICAL DATA:  Inpatient with paraplegia secondary to transverse myelitis, poor dentition and failure to thrive with weight loss. EXAM: ESOPHOGRAM/BARIUM SWALLOW TECHNIQUE: Single contrast examination was performed using  thin barium. FLUOROSCOPY TIME:  Fluoroscopy Time:  2 minutes 42 seconds Radiation Exposure Index (if provided by the fluoroscopic device): 23.6 mGy Number of Acquired Spot  Images: 0 COMPARISON:  None. FINDINGS: Examination is extremely limited due to patient's inability to drink through a straw (patient appeared to have difficulty forming a seal around the straw). Examination performed in the left lateral decubitus position with mild head elevation. Thin barium was administered into the patient's mouth via syringe. Pharyngeal phase of swallowing is grossly normal with no evidence of laryngeal penetration or tracheobronchial aspiration. Esophageal motility is grossly normal. Esophageal distensibility appears normal with no evidence of esophageal mass, stricture or ulcer. No evidence of hiatal hernia. Unable to assess for gastroesophageal reflux given the above limitations. Pill not administered  due to patient's limited oral phase of swallowing and inability to sit upright. IMPRESSION: 1. Very limited examination due to limited oral phase of swallowing, see comments and consider speech/swallow therapy consultation. 2. Unremarkable esophagus with no gross evidence of esophageal dysmotility or stricture. Electronically Signed   By: Ilona Sorrel M.D.   On: 06/02/2020 14:50        Scheduled Meds: . (feeding supplement) PROSource Plus  30 mL Oral BID BM  . antiseptic oral rinse  10 mL Mouth Rinse BID  . vitamin C  500 mg Oral Daily  . aspirin EC  81 mg Oral QHS  . atorvastatin  20 mg Oral Daily  . Chlorhexidine Gluconate Cloth  6 each Topical Daily  . cholestyramine  4 g Oral Daily  . diclofenac Sodium  2 g Topical BID  . dronabinol  2.5 mg Oral BID AC  . enoxaparin (LOVENOX) injection  40 mg Subcutaneous Q24H  . feeding supplement (ENSURE ENLIVE)  237 mL Oral BID BM  . fluconazole  100 mg Oral Daily  . fluticasone  2 spray Each Nare BID  . gabapentin  100 mg Oral Daily  . gabapentin  300 mg Oral QHS  . icosapent Ethyl  2 g Oral BID  . [START ON 06/08/2020] influenza vaccine adjuvanted  0.5 mL Intramuscular Once  . insulin aspart  0-15 Units Subcutaneous TID WC  .  lidocaine  1 patch Transdermal Q24H  . multivitamin with minerals  1 tablet Oral Daily  . Muscle Rub  1 application Topical BID  . ondansetron  4 mg Oral BID  . oxybutynin  5 mg Oral Daily  . pantoprazole  40 mg Oral Daily  . saccharomyces boulardii  250 mg Oral BID  . sertraline  150 mg Oral QHS  . tamsulosin  0.4 mg Oral QPC supper  . vitamin B-12  500 mcg Oral Daily  . zinc sulfate  220 mg Oral Daily   Continuous Infusions: . ceFEPime (MAXIPIME) IV 2 g (06/03/20 0546)  . lactated ringers 75 mL/hr at 06/03/20 0232  . metronidazole 500 mg (06/03/20 0846)  . potassium chloride 10 mEq (06/03/20 0844)  . vancomycin Stopped (06/03/20 0332)     LOS: 2 days     Cordelia Poche, MD [facility] 06/03/2020, 9:25 AM  If 7PM-7AM, please contact night-coverage [email or web address]

## 2020-06-03 NOTE — Progress Notes (Signed)
SLP Cancellation Note  Patient Details Name: Brent [name]. MRN: [id number] DOB: October 28, 1945   Cancelled treatment:       Reason Eval/Treat Not Completed: Medical issues which prohibited therapy (NPO for procedure per chart). Will f/u as able.   Osie Bond., M.A. [facility] Pager [phone] Office [phone]  06/03/2020, 1:42 PM

## 2020-06-04 ENCOUNTER — Inpatient Hospital Stay (HOSPITAL_COMMUNITY): Payer: Medicare Other | Admitting: Critical Care Medicine

## 2020-06-04 ENCOUNTER — Encounter (HOSPITAL_COMMUNITY)
Admission: EM | Disposition: A | Discharge status: Skilled Nursing Facility | Payer: Self-pay | Source: Skilled Nursing Facility | Attending: Family Medicine

## 2020-06-04 ENCOUNTER — Encounter (HOSPITAL_COMMUNITY): Payer: Self-pay | Admitting: Internal Medicine

## 2020-06-04 ENCOUNTER — Inpatient Hospital Stay (HOSPITAL_COMMUNITY): Payer: Medicare Other

## 2020-06-04 ENCOUNTER — Inpatient Hospital Stay: Payer: Medicare Other | Admitting: Physical Medicine & Rehabilitation

## 2020-06-04 DIAGNOSIS — E440 Moderate protein-calorie malnutrition: Secondary | ICD-10-CM | POA: Diagnosis present

## 2020-06-04 HISTORY — PX: IRRIGATION AND DEBRIDEMENT ABSCESS: SHX5252

## 2020-06-04 HISTORY — PX: LYSIS OF ADHESION: SHX5961

## 2020-06-04 HISTORY — PX: LAPAROSCOPY: SHX197

## 2020-06-04 LAB — POCT I-STAT, CHEM 8
BUN: 5 mg/dL — ABNORMAL LOW (ref 8–23)
BUN: 5 mg/dL — ABNORMAL LOW (ref 8–23)
Calcium, Ion: 1.45 mmol/L — ABNORMAL HIGH (ref 1.15–1.40)
Calcium, Ion: 1.4 mmol/L (ref 1.15–1.40)
Chloride: 102 mmol/L (ref 98–111)
Chloride: 103 mmol/L (ref 98–111)
Creatinine, Ser: <0.2 mg/dL — ABNORMAL LOW (ref 0.61–1.24)
Creatinine, Ser: <0.2 mg/dL — ABNORMAL LOW (ref 0.61–1.24)
Glucose, Bld: 104 mg/dL — ABNORMAL HIGH (ref 70–99)
Glucose, Bld: 112 mg/dL — ABNORMAL HIGH (ref 70–99)
HCT: 24 % — ABNORMAL LOW (ref 39.0–52.0)
HCT: 24 % — ABNORMAL LOW (ref 39.0–52.0)
Hemoglobin: 8.2 g/dL — ABNORMAL LOW (ref 13.0–17.0)
Hemoglobin: 8.2 g/dL — ABNORMAL LOW (ref 13.0–17.0)
Potassium: 3.1 mmol/L — ABNORMAL LOW (ref 3.5–5.1)
Potassium: 3 mmol/L — ABNORMAL LOW (ref 3.5–5.1)
Sodium: 137 mmol/L (ref 135–145)
Sodium: 138 mmol/L (ref 135–145)
TCO2: 21 mmol/L — ABNORMAL LOW (ref 22–32)
TCO2: 21 mmol/L — ABNORMAL LOW (ref 22–32)

## 2020-06-04 LAB — GLUCOSE, CAPILLARY
Glucose-Capillary: 88 mg/dL (ref 70–99)
Glucose-Capillary: 105 mg/dL — ABNORMAL HIGH (ref 70–99)
Glucose-Capillary: 105 mg/dL — ABNORMAL HIGH (ref 70–99)
Glucose-Capillary: 132 mg/dL — ABNORMAL HIGH (ref 70–99)
Glucose-Capillary: 132 mg/dL — ABNORMAL HIGH (ref 70–99)

## 2020-06-04 LAB — CULTURE, BLOOD (ROUTINE X 2): Special Requests: ADEQUATE

## 2020-06-04 LAB — ABO/RH: ABO/RH(D): O POS

## 2020-06-04 LAB — PREPARE RBC (CROSSMATCH)

## 2020-06-04 LAB — SURGICAL PCR SCREEN
MRSA, PCR: NEGATIVE
Staphylococcus aureus: POSITIVE — AB

## 2020-06-04 SURGERY — LAPAROSCOPY, DIAGNOSTIC
Anesthesia: General | Site: Buttocks

## 2020-06-04 MED ORDER — PROSOURCE TF PO LIQD
90.0000 mL | Freq: Two times a day (BID) | ORAL | Status: DC
  Administered 2020-06-05 – 2020-06-10 (×11): 90 mL
  Filled 2020-06-04 (×12): qty 90

## 2020-06-04 MED ORDER — FENTANYL CITRATE (PF) 250 MCG/5ML IJ SOLN
INTRAMUSCULAR | Status: DC | PRN
Start: 2020-06-04 — End: 2020-06-04
  Administered 2020-06-04 (×4): 50 ug via INTRAVENOUS

## 2020-06-04 MED ORDER — LIDOCAINE 2% (20 MG/ML) 5 ML SYRINGE
INTRAMUSCULAR | Status: DC | PRN
  Administered 2020-06-04: 50 mg via INTRAVENOUS

## 2020-06-04 MED ORDER — FENTANYL CITRATE (PF) 100 MCG/2ML IJ SOLN: 25.0000 ug | INTRAMUSCULAR | Status: DC | PRN

## 2020-06-04 MED ORDER — OSMOLITE 1.5 CAL PO LIQD
1000.0000 mL | ORAL | Status: DC
  Administered 2020-06-04 – 2020-06-09 (×7): 1000 mL
  Filled 2020-06-04 (×7): qty 1000

## 2020-06-04 MED ORDER — ONDANSETRON HCL 4 MG/2ML IJ SOLN: 4.0000 mg | Freq: Once | INTRAMUSCULAR | Status: DC | PRN

## 2020-06-04 MED ORDER — ROCURONIUM BROMIDE 10 MG/ML (PF) SYRINGE
PREFILLED_SYRINGE | INTRAVENOUS | Status: AC
  Filled 2020-06-04: qty 20

## 2020-06-04 MED ORDER — PROPOFOL 10 MG/ML IV BOLUS
INTRAVENOUS | Status: DC | PRN
  Administered 2020-06-04: 160 mg via INTRAVENOUS

## 2020-06-04 MED ORDER — BUPIVACAINE HCL (PF) 0.25 % IJ SOLN
INTRAMUSCULAR | Status: AC
  Filled 2020-06-04: qty 30

## 2020-06-04 MED ORDER — CEFAZOLIN SODIUM-DEXTROSE 2-3 GM-%(50ML) IV SOLR
INTRAVENOUS | Status: DC | PRN
  Administered 2020-06-04: 2 g via INTRAVENOUS

## 2020-06-04 MED ORDER — PROSOURCE TF PO LIQD: 90.0000 mL | Freq: Two times a day (BID) | ORAL | Status: DC

## 2020-06-04 MED ORDER — MIDAZOLAM HCL 2 MG/2ML IJ SOLN
INTRAMUSCULAR | Status: AC
  Filled 2020-06-04: qty 2

## 2020-06-04 MED ORDER — JUVEN PO PACK
1.0000 | PACK | Freq: Two times a day (BID) | ORAL | Status: DC
  Administered 2020-06-05 – 2020-06-10 (×12): 1
  Filled 2020-06-04 (×12): qty 1

## 2020-06-04 MED ORDER — PHENYLEPHRINE 40 MCG/ML (10ML) SYRINGE FOR IV PUSH (FOR BLOOD PRESSURE SUPPORT)
PREFILLED_SYRINGE | INTRAVENOUS | Status: AC
  Filled 2020-06-04: qty 20

## 2020-06-04 MED ORDER — MUPIROCIN 2 % EX OINT
1.0000 "application " | TOPICAL_OINTMENT | Freq: Two times a day (BID) | CUTANEOUS | Status: AC
  Administered 2020-06-04 – 2020-06-09 (×10): 1 via NASAL
  Filled 2020-06-04 (×4): qty 22

## 2020-06-04 MED ORDER — ORAL CARE MOUTH RINSE: 15.0000 mL | Freq: Once | OROMUCOSAL | Status: AC

## 2020-06-04 MED ORDER — SUGAMMADEX SODIUM 200 MG/2ML IV SOLN
INTRAVENOUS | Status: DC | PRN
  Administered 2020-06-04: 200 mg via INTRAVENOUS

## 2020-06-04 MED ORDER — ROCURONIUM BROMIDE 10 MG/ML (PF) SYRINGE
PREFILLED_SYRINGE | INTRAVENOUS | Status: DC | PRN
  Administered 2020-06-04: 20 mg via INTRAVENOUS
  Administered 2020-06-04: 50 mg via INTRAVENOUS
  Administered 2020-06-04: 20 mg via INTRAVENOUS

## 2020-06-04 MED ORDER — ONDANSETRON HCL 4 MG/2ML IJ SOLN
INTRAMUSCULAR | Status: AC
  Filled 2020-06-04: qty 2

## 2020-06-04 MED ORDER — DEXAMETHASONE SODIUM PHOSPHATE 10 MG/ML IJ SOLN
INTRAMUSCULAR | Status: AC
  Filled 2020-06-04: qty 1

## 2020-06-04 MED ORDER — CEFAZOLIN SODIUM 1 G IJ SOLR
INTRAMUSCULAR | Status: AC
  Filled 2020-06-04: qty 20

## 2020-06-04 MED ORDER — CHLORHEXIDINE GLUCONATE 0.12 % MT SOLN
OROMUCOSAL | Status: AC
  Administered 2020-06-04: 15 mL via OROMUCOSAL
  Filled 2020-06-04: qty 15

## 2020-06-04 MED ORDER — 0.9 % SODIUM CHLORIDE (POUR BTL) OPTIME
TOPICAL | Status: DC | PRN
  Administered 2020-06-04 (×2): 1000 mL

## 2020-06-04 MED ORDER — CHLORHEXIDINE GLUCONATE CLOTH 2 % EX PADS
6.0000 | MEDICATED_PAD | Freq: Every day | CUTANEOUS | Status: AC
  Administered 2020-06-05 – 2020-06-09 (×5): 6 via TOPICAL

## 2020-06-04 MED ORDER — LACTATED RINGERS IV SOLN: INTRAVENOUS | Status: DC | PRN

## 2020-06-04 MED ORDER — BUPIVACAINE HCL 0.25 % IJ SOLN
INTRAMUSCULAR | Status: DC | PRN
  Administered 2020-06-04: 6 mL

## 2020-06-04 MED ORDER — FENTANYL CITRATE (PF) 250 MCG/5ML IJ SOLN
INTRAMUSCULAR | Status: AC
  Filled 2020-06-04: qty 5

## 2020-06-04 MED ORDER — LIDOCAINE 2% (20 MG/ML) 5 ML SYRINGE
INTRAMUSCULAR | Status: AC
  Filled 2020-06-04: qty 5

## 2020-06-04 MED ORDER — PHENYLEPHRINE HCL-NACL 10-0.9 MG/250ML-% IV SOLN
INTRAVENOUS | Status: DC | PRN
  Administered 2020-06-04: 50 ug/min via INTRAVENOUS

## 2020-06-04 MED ORDER — CHLORHEXIDINE GLUCONATE 0.12 % MT SOLN: 15.0000 mL | Freq: Once | OROMUCOSAL | Status: AC

## 2020-06-04 MED ORDER — ALBUMIN HUMAN 5 % IV SOLN: INTRAVENOUS | Status: DC | PRN

## 2020-06-04 MED ORDER — FENTANYL CITRATE (PF) 100 MCG/2ML IJ SOLN
INTRAMUSCULAR | Status: DC
Start: 2020-06-04 — End: 2020-06-04
  Filled 2020-06-04: qty 2

## 2020-06-04 MED ORDER — SUCCINYLCHOLINE CHLORIDE 200 MG/10ML IV SOSY
PREFILLED_SYRINGE | INTRAVENOUS | Status: AC
  Filled 2020-06-04: qty 10

## 2020-06-04 MED ORDER — STERILE WATER FOR IRRIGATION IR SOLN
Status: DC | PRN
  Administered 2020-06-04: 1000 mL

## 2020-06-04 MED ORDER — PROPOFOL 10 MG/ML IV BOLUS
INTRAVENOUS | Status: AC
  Filled 2020-06-04: qty 20

## 2020-06-04 SURGICAL SUPPLY — 52 items
BLADE CLIPPER SURG (BLADE) IMPLANT
BNDG GAUZE ELAST 4 BULKY (GAUZE/BANDAGES/DRESSINGS) ×4 IMPLANT
BRIDGE OSTOMY 3 1/2 (OSTOMY) ×4 IMPLANT
CANISTER SUCT 3000ML PPV (MISCELLANEOUS) ×4 IMPLANT
CATH GASTROSTOMY 20FR (CATHETERS) ×4 IMPLANT
CATH ROBINSON RED A/P 16FR (CATHETERS) ×4 IMPLANT
CHLORAPREP W/TINT 26 (MISCELLANEOUS) ×4 IMPLANT
COVER SURGICAL LIGHT HANDLE (MISCELLANEOUS) ×4 IMPLANT
COVER WAND RF STERILE (DRAPES) ×4 IMPLANT
DERMABOND ADHESIVE PROPEN (GAUZE/BANDAGES/DRESSINGS) ×2
DERMABOND ADVANCED (GAUZE/BANDAGES/DRESSINGS) ×2
DERMABOND ADVANCED .7 DNX12 (GAUZE/BANDAGES/DRESSINGS) ×2 IMPLANT
DERMABOND ADVANCED .7 DNX6 (GAUZE/BANDAGES/DRESSINGS) ×2 IMPLANT
DRAPE LAPAROSCOPIC ABDOMINAL (DRAPES) IMPLANT
DRAPE LAPAROTOMY 100X72 PEDS (DRAPES) IMPLANT
DRAPE WARM FLUID 44X44 (DRAPES) ×4 IMPLANT
DRSG PAD ABDOMINAL 8X10 ST (GAUZE/BANDAGES/DRESSINGS) ×4 IMPLANT
ELECT REM PT RETURN 9FT ADLT (ELECTROSURGICAL) ×4
ELECTRODE REM PT RTRN 9FT ADLT (ELECTROSURGICAL) ×2 IMPLANT
GAUZE SPONGE 4X4 12PLY STRL (GAUZE/BANDAGES/DRESSINGS) IMPLANT
GLOVE BIO SURGEON STRL SZ 6.5 (GLOVE) ×3 IMPLANT
GLOVE BIO SURGEONS STRL SZ 6.5 (GLOVE) ×1
GLOVE BIOGEL PI IND STRL 6 (GLOVE) ×2 IMPLANT
GLOVE BIOGEL PI INDICATOR 6 (GLOVE) ×2
GOWN STRL REUS W/ TWL LRG LVL3 (GOWN DISPOSABLE) ×4 IMPLANT
GOWN STRL REUS W/TWL LRG LVL3 (GOWN DISPOSABLE) ×8
HANDPIECE INTERPULSE COAX TIP (DISPOSABLE) ×4
KIT BASIN OR (CUSTOM PROCEDURE TRAY) ×8 IMPLANT
KIT OSTOMY DRAINABLE 2.75 STR (WOUND CARE) ×4 IMPLANT
KIT TURNOVER KIT B (KITS) ×4 IMPLANT
NS IRRIG 1000ML POUR BTL (IV SOLUTION) ×4 IMPLANT
PACK GENERAL/GYN (CUSTOM PROCEDURE TRAY) ×4 IMPLANT
PAD ARMBOARD 7.5X6 YLW CONV (MISCELLANEOUS) ×8 IMPLANT
PENCIL SMOKE EVACUATOR (MISCELLANEOUS) ×8 IMPLANT
SCISSORS LAP 5X35 DISP (ENDOMECHANICALS) IMPLANT
SET HNDPC FAN SPRY TIP SCT (DISPOSABLE) ×2 IMPLANT
SET IRRIG TUBING LAPAROSCOPIC (IRRIGATION / IRRIGATOR) IMPLANT
SET TUBE SMOKE EVAC HIGH FLOW (TUBING) ×4 IMPLANT
SLEEVE ENDOPATH XCEL 5M (ENDOMECHANICALS) ×4 IMPLANT
SUT ETHILON 3 0 FSL (SUTURE) ×8 IMPLANT
SUT MNCRL AB 4-0 PS2 18 (SUTURE) ×8 IMPLANT
SUT VIC AB 2-0 SH 18 (SUTURE) ×8 IMPLANT
SWAB COLLECTION DEVICE MRSA (MISCELLANEOUS) ×4 IMPLANT
SWAB CULTURE ESWAB REG 1ML (MISCELLANEOUS) IMPLANT
TOWEL GREEN STERILE (TOWEL DISPOSABLE) ×4 IMPLANT
TOWEL GREEN STERILE FF (TOWEL DISPOSABLE) ×4 IMPLANT
TRAY LAPAROSCOPIC MC (CUSTOM PROCEDURE TRAY) ×4 IMPLANT
TROCAR XCEL BLUNT TIP 100MML (ENDOMECHANICALS) IMPLANT
TROCAR XCEL NON-BLD 5MMX100MML (ENDOMECHANICALS) ×4 IMPLANT
TUBE CONNECTING 12'X1/4 (SUCTIONS) ×1
TUBE CONNECTING 12X1/4 (SUCTIONS) ×3 IMPLANT
YANKAUER SUCT BULB TIP NO VENT (SUCTIONS) ×4 IMPLANT

## 2020-06-04 NOTE — Progress Notes (Addendum)
PROGRESS NOTE    Brent [name].  SLH:[id number] DOB: 04-Sep-1946 DOA: 06/01/2020 PCP: [name], [name], MD   Brief Narrative: Brent [name]. is a 74 y.o. male with a history of transverse myelitis status post rituximab, paraplegia secondary to transverse myelitis, hypertension, hyperlipidemia, insulin-dependent diabetes mellitus, CAD.  Patient presented secondary to trouble eating and failure to thrive and was found to have a left gluteal abscess in addition to discitis/osteomyelitis on imaging.  Patient started empirically on antibiotics for management in addition to general surgery consult for debridement of sacral wound/abscess.  Patient started empirically on IV antibiotics.   Assessment & Plan:   Principal Problem:   Abscess, gluteal, left Active Problems:   Hyperlipidemia   Essential hypertension   Transverse myelitis (HCC)   Sacral ulcer, with necrosis of muscle (HCC)   FTT (failure to thrive) in adult   Palliative care by specialist   Goals of care, counseling/discussion   Discitis of thoracic region   Osteomyelitis ([place])   Left gluteal abscess/necrotic sacral wound L4-L5 discitis/osteomyelitis Gram-negative rod bacteremia General surgery consulted for debridement.  Blood cultures significant for proteus mirabilis in 1 out of 4 samples.  Unlikely that this is a contaminant in setting of gluteal abscess and discitis. -Continue cefepime and vancomycin -Infectious disease consult -General surgery recommendations: Debridement/colostomy/gastrostomy tube today  History of transverse myelitis with paraplegia -PT/OT  Dysphagia Esophagram performed on 9/14 without evidence of esophageal dysmotility. Likely oral phase disability. Speech therapy consulted. Complicated by Xerostomia. GI consulted who recommends ENT consult. -SLP recommendations pending  Failure to thrive In setting of above. Plan for G-tube placement per general surgery. -Continue  LR -General surgery recommendations for G-tube placement -Daily BMP, Magnesium, Phosphorus  Abdominal nodule Unknown etiology. Patient states he has had it for some time. Otherwise unremarkable.  Hyponatremia -Continue IV potassium supplementation while NPO  Hypomagnesemia Contributing to above -Replete with IV magnesium sulfate  Diabetes mellitus, type II Hemoglobin A1c of 6.6%. -Continue sliding scale insulin  Hyperlipidemia -Continue atorvastatin  Essential hypertension On lisinopril as an outpatient which was held on admission.  Blood pressure has been mostly controlled while inpatient.  Pressure injury Sacral, left anterior ankle, right anterior ankle, right heel, left heel.  All present on admission    DVT prophylaxis: Lovenox Code Status:   Code Status: Full Code Family Communication: None at bedside Disposition Plan: Discharge likely to SNF in several days pending management of sacral wound by general surgery in addition to management of infection with anticipated ID recommendations for outpatient antibiotic regimen   Consultants:   General surgery  Gastroenterology  Procedures:   None  Antimicrobials:  Vancomycin  Cefepime  Flagyl  Subjective: No issues today. Persistent dry mouth  Objective: Vitals:   06/03/20 1310 06/03/20 2057 06/04/20 0522 06/04/20 1415  BP: 124/65 (!) 128/56 (!) 118/57   Pulse: 76 73 69   Resp: 20 16 16    Temp: 97.6 F (36.4 C) (!) 97.5 F (36.4 C) (!) 97.5 F (36.4 C) 97.6 F (36.4 C)  TempSrc: Oral     SpO2: 94% 98% 100%   Weight:      Height:        Intake/Output Summary (Last 24 hours) at 06/04/2020 1430 Last data filed at 06/04/2020 1419 Gross per 24 hour  Intake 3874.53 ml  Output 1500 ml  Net 2374.53 ml   Filed Weights   06/01/20 1400  Weight: 108 kg    Examination:  General exam: Appears calm  and comfortable  HEENT: dry mouth. No lesions. Respiratory system: Clear to auscultation. Respiratory  effort normal. Cardiovascular system: S1 & S2 heard, RRR. No murmurs, rubs, gallops or clicks. Gastrointestinal system: Abdomen is nondistended, soft and nontender. No organomegaly or masses felt. Normal bowel sounds heard. Central nervous system: Alert and oriented. No focal neurological deficits. Musculoskeletal: Extremity edema. No calf tenderness Skin: No cyanosis. No rashes Psychiatry: Judgement and insight appear normal. Mood & affect appropriate.      Data Reviewed: I have personally reviewed following labs and imaging studies  CBC Lab Results  Component Value Date   WBC 5.9 06/02/2020   RBC 2.82 (L) 06/02/2020   HGB 8.2 (L) 06/04/2020   HCT 24.0 (L) 06/04/2020   MCV 83.0 06/02/2020   MCH 25.5 (L) 06/02/2020   PLT 381 06/02/2020   MCHC 30.8 06/02/2020   RDW 14.1 06/02/2020   LYMPHSABS 0.6 (L) 06/02/2020   MONOABS 0.4 06/02/2020   EOSABS 0.0 06/02/2020   BASOSABS 0.0 34/19/3790     Last metabolic panel Lab Results  Component Value Date   NA 138 06/04/2020   K 3.0 (L) 06/04/2020   CL 103 06/04/2020   CO2 22 06/03/2020   BUN 5 (L) 06/04/2020   CREATININE <0.20 (L) 06/04/2020   GLUCOSE 112 (H) 06/04/2020   GFRNONAA NOT CALCULATED 06/03/2020   GFRAA NOT CALCULATED 06/03/2020   CALCIUM 9.5 06/03/2020   PHOS 3.1 06/01/2020   PROT 5.3 (L) 06/01/2020   ALBUMIN 1.5 (L) 06/01/2020   LABGLOB 2.0 04/11/2019   AGRATIO 2.3 (H) 04/11/2019   BILITOT 0.7 06/01/2020   ALKPHOS 134 (H) 06/01/2020   AST 62 (H) 06/01/2020   ALT 68 (H) 06/01/2020   ANIONGAP 9 06/03/2020    CBG (last 3)  Recent Labs    06/04/20 0755 06/04/20 0950 06/04/20 1416  GLUCAP 88 105* 105*     GFR: CrCl cannot be calculated (This lab value cannot be used to calculate CrCl because it is not a number: <0.20).  Coagulation Profile: No results for input(s): INR, PROTIME in the last 168 hours.  Recent Results (from the past 240 hour(s))  SARS Coronavirus 2 by RT PCR (hospital order, performed  in [facility] hospital lab) Nasopharyngeal Nasopharyngeal Swab     Status: None   Collection Time: 06/01/20 12:24 PM   Specimen: Nasopharyngeal Swab  Result Value Ref Range Status   SARS Coronavirus 2 NEGATIVE NEGATIVE Final    Comment: (NOTE) SARS-CoV-2 target nucleic acids are NOT DETECTED.  The SARS-CoV-2 RNA is generally detectable in upper and lower respiratory specimens during the acute phase of infection. The lowest concentration of SARS-CoV-2 viral copies this assay can detect is 250 copies / mL. A negative result does not preclude SARS-CoV-2 infection and should not be used as the sole basis for treatment or other patient management decisions.  A negative result may occur with improper specimen collection / handling, submission of specimen other than nasopharyngeal swab, presence of viral mutation(s) within the areas targeted by this assay, and inadequate number of viral copies (<250 copies / mL). A negative result must be combined with clinical observations, patient history, and epidemiological information.  Fact Sheet for Patients:   StrictlyIdeas.no  Fact Sheet for Healthcare Providers: BankingDealers.co.za  This test is not yet approved or  cleared by the [place] FDA and has been authorized for detection and/or diagnosis of SARS-CoV-2 by FDA under an Emergency Use Authorization (EUA).  This EUA will remain in effect (meaning this  test can be used) for the duration of the COVID-19 declaration under Section 564(b)(1) of the Act, 21 U.S.C. section 360bbb-3(b)(1), unless the authorization is terminated or revoked sooner.  Performed at [facility], [place] [address], [place], [place] [address]   Blood culture (routine x 2)     Status: None (Preliminary result)   Collection Time: 06/01/20 12:30 PM   Specimen: BLOOD  Result Value Ref Range Status   Specimen Description BLOOD SITE NOT SPECIFIED  Final   Special  Requests   Final    BOTTLES DRAWN AEROBIC AND ANAEROBIC Blood Culture adequate volume   Culture  Setup Time   Final    AEROBIC BOTTLE ONLY GRAM NEGATIVE RODS CRITICAL VALUE NOTED.  VALUE IS CONSISTENT WITH PREVIOUSLY REPORTED AND CALLED VALUE.    Culture   Final    NO GROWTH 3 DAYS Performed at [facility], [place] [address], [place], [place] [address]    Report Status PENDING  Incomplete  Blood culture (routine x 2)     Status: Abnormal   Collection Time: 06/01/20 12:35 PM   Specimen: BLOOD  Result Value Ref Range Status   Specimen Description BLOOD SITE NOT SPECIFIED  Final   Special Requests   Final    BOTTLES DRAWN AEROBIC AND ANAEROBIC Blood Culture adequate volume   Culture  Setup Time   Final    GRAM NEGATIVE RODS AEROBIC BOTTLE ONLY CRITICAL RESULT CALLED TO, READ BACK BY AND VERIFIED WITH: PHARMD E.FULOIPEN AT 4196 ON 06/02/2020 BY T.SAAD Performed at [facility], [place] [address] [place] [address]    Culture PROTEUS MIRABILIS (A)  Final   Report Status 06/04/2020 FINAL  Final   Organism ID, Bacteria PROTEUS MIRABILIS  Final      Susceptibility   Proteus mirabilis - MIC*    AMPICILLIN <=2 SENSITIVE Sensitive     CEFAZOLIN <=4 SENSITIVE Sensitive     CEFEPIME <=0.12 SENSITIVE Sensitive     CEFTAZIDIME <=1 SENSITIVE Sensitive     CEFTRIAXONE <=0.25 SENSITIVE Sensitive     CIPROFLOXACIN <=0.25 SENSITIVE Sensitive     GENTAMICIN <=1 SENSITIVE Sensitive     IMIPENEM 4 SENSITIVE Sensitive     TRIMETH/SULFA >=320 RESISTANT Resistant     AMPICILLIN/SULBACTAM <=2 SENSITIVE Sensitive     PIP/TAZO <=4 SENSITIVE Sensitive     * PROTEUS MIRABILIS  Blood Culture ID Panel (Reflexed)     Status: Abnormal   Collection Time: 06/01/20 12:35 PM  Result Value Ref Range Status   Enterococcus faecalis NOT DETECTED NOT DETECTED Final   Enterococcus Faecium NOT DETECTED NOT DETECTED Final   Listeria monocytogenes NOT DETECTED NOT DETECTED Final   Staphylococcus  species NOT DETECTED NOT DETECTED Final   Staphylococcus aureus (BCID) NOT DETECTED NOT DETECTED Final   Staphylococcus epidermidis NOT DETECTED NOT DETECTED Final   Staphylococcus lugdunensis NOT DETECTED NOT DETECTED Final   Streptococcus species NOT DETECTED NOT DETECTED Final   Streptococcus agalactiae NOT DETECTED NOT DETECTED Final   Streptococcus pneumoniae NOT DETECTED NOT DETECTED Final   Streptococcus pyogenes NOT DETECTED NOT DETECTED Final   A.calcoaceticus-baumannii NOT DETECTED NOT DETECTED Final   Bacteroides fragilis NOT DETECTED NOT DETECTED Final   Enterobacterales DETECTED (A) NOT DETECTED Final    Comment: Enterobacterales represent a large order of gram negative bacteria, not a single organism. CRITICAL RESULT CALLED TO, READ BACK BY AND VERIFIED WITH: PHARMD E.FULOIPEN AT 1205 ON 06/02/2020 BY T.SAAD    Enterobacter  cloacae complex NOT DETECTED NOT DETECTED Final   Escherichia coli NOT DETECTED NOT DETECTED Final   Klebsiella aerogenes NOT DETECTED NOT DETECTED Final   Klebsiella oxytoca NOT DETECTED NOT DETECTED Final   Klebsiella pneumoniae NOT DETECTED NOT DETECTED Final   Proteus species DETECTED (A) NOT DETECTED Final    Comment: CRITICAL RESULT CALLED TO, READ BACK BY AND VERIFIED WITH: PHARMD E.FULOIPEN AT 1205 ON 06/02/2020 BY T.SAAD    Salmonella species NOT DETECTED NOT DETECTED Final   Serratia marcescens NOT DETECTED NOT DETECTED Final   Haemophilus influenzae NOT DETECTED NOT DETECTED Final   Neisseria meningitidis NOT DETECTED NOT DETECTED Final   Pseudomonas aeruginosa NOT DETECTED NOT DETECTED Final   Stenotrophomonas maltophilia NOT DETECTED NOT DETECTED Final   Candida albicans NOT DETECTED NOT DETECTED Final   Candida auris NOT DETECTED NOT DETECTED Final   Candida glabrata NOT DETECTED NOT DETECTED Final   Candida krusei NOT DETECTED NOT DETECTED Final   Candida parapsilosis NOT DETECTED NOT DETECTED Final   Candida tropicalis NOT DETECTED  NOT DETECTED Final   Cryptococcus neoformans/gattii NOT DETECTED NOT DETECTED Final   CTX-M ESBL NOT DETECTED NOT DETECTED Final   Carbapenem resistance IMP NOT DETECTED NOT DETECTED Final   Carbapenem resistance KPC NOT DETECTED NOT DETECTED Final   Carbapenem resistance NDM NOT DETECTED NOT DETECTED Final   Carbapenem resist OXA 48 LIKE NOT DETECTED NOT DETECTED Final   Carbapenem resistance VIM NOT DETECTED NOT DETECTED Final    Comment: Performed at [facility], [address] [place], [place] [address]  Surgical pcr screen     Status: Abnormal   Collection Time: 06/04/20  9:13 AM   Specimen: Nasal Mucosa; Nasal Swab  Result Value Ref Range Status   MRSA, PCR NEGATIVE NEGATIVE Final   Staphylococcus aureus POSITIVE (A) NEGATIVE Final    Comment: (NOTE) The Xpert SA Assay (FDA approved for NASAL specimens in patients 21 years of age and older), is one component of a comprehensive surveillance program. It is not intended to diagnose infection nor to guide or monitor treatment. Performed at [facility], [place] [address], [place], [place] [address]         Radiology Studies: DG ESOPHAGUS W SINGLE CM (SOL OR THIN BA)  Result Date: 06/02/2020 CLINICAL DATA:  Inpatient with paraplegia secondary to transverse myelitis, poor dentition and failure to thrive with weight loss. EXAM: ESOPHOGRAM/BARIUM SWALLOW TECHNIQUE: Single contrast examination was performed using  thin barium. FLUOROSCOPY TIME:  Fluoroscopy Time:  2 minutes 42 seconds Radiation Exposure Index (if provided by the fluoroscopic device): 23.6 mGy Number of Acquired Spot Images: 0 COMPARISON:  None. FINDINGS: Examination is extremely limited due to patient's inability to drink through a straw (patient appeared to have difficulty forming a seal around the straw). Examination performed in the left lateral decubitus position with mild head elevation. Thin barium was administered into the patient's mouth via syringe.  Pharyngeal phase of swallowing is grossly normal with no evidence of laryngeal penetration or tracheobronchial aspiration. Esophageal motility is grossly normal. Esophageal distensibility appears normal with no evidence of esophageal mass, stricture or ulcer. No evidence of hiatal hernia. Unable to assess for gastroesophageal reflux given the above limitations. Pill not administered due to patient's limited oral phase of swallowing and inability to sit upright. IMPRESSION: 1. Very limited examination due to limited oral phase of swallowing, see comments and consider speech/swallow therapy consultation. 2. Unremarkable esophagus with no gross evidence of esophageal dysmotility  or stricture. Electronically Signed   By: Ilona Sorrel M.D.   On: 06/02/2020 14:50        Scheduled Meds: . [MAR Hold] (feeding supplement) PROSource Plus  30 mL Oral BID BM  . [MAR Hold] antiseptic oral rinse  10 mL Mouth Rinse BID  . [MAR Hold] vitamin C  500 mg Oral Daily  . [MAR Hold] aspirin EC  81 mg Oral QHS  . [MAR Hold] atorvastatin  20 mg Oral Daily  . [MAR Hold] Chlorhexidine Gluconate Cloth  6 each Topical Daily  . [MAR Hold] cholestyramine  4 g Oral Daily  . [MAR Hold] diclofenac Sodium  2 g Topical BID  . [MAR Hold] dronabinol  2.5 mg Oral BID AC  . [MAR Hold] enoxaparin (LOVENOX) injection  40 mg Subcutaneous Q24H  . [MAR Hold] feeding supplement (ENSURE ENLIVE)  237 mL Oral BID BM  . [MAR Hold] fluconazole  100 mg Oral Daily  . [MAR Hold] fluticasone  2 spray Each Nare BID  . [MAR Hold] gabapentin  100 mg Oral Daily  . [MAR Hold] gabapentin  300 mg Oral QHS  . [MAR Hold] icosapent Ethyl  2 g Oral BID  . [START ON 06/08/2020] influenza vaccine adjuvanted  0.5 mL Intramuscular Once  . [MAR Hold] insulin aspart  0-15 Units Subcutaneous TID WC  . [MAR Hold] lidocaine  1 patch Transdermal Q24H  . [MAR Hold] multivitamin with minerals  1 tablet Oral Daily  . [MAR Hold] Muscle Rub  1 application Topical BID    . [MAR Hold] ondansetron  4 mg Oral BID  . [MAR Hold] oxybutynin  5 mg Oral Daily  . [MAR Hold] pantoprazole  40 mg Oral Daily  . [MAR Hold] saccharomyces boulardii  250 mg Oral BID  . [MAR Hold] sertraline  150 mg Oral QHS  . [MAR Hold] tamsulosin  0.4 mg Oral QPC supper  . [MAR Hold] vitamin B-12  500 mcg Oral Daily  . [MAR Hold] zinc sulfate  220 mg Oral Daily   Continuous Infusions: . [MAR Hold] ceFEPime (MAXIPIME) IV 2 g (06/04/20 0549)  . lactated ringers Stopped (06/04/20 1355)  . [MAR Hold] metronidazole 500 mg (06/04/20 0905)  . [MAR Hold] vancomycin 1,000 mg (06/04/20 0236)     LOS: 3 days     Cordelia Poche, MD [facility] 06/04/2020, 2:30 PM  If 7PM-7AM, please contact night-coverage [email or web address]

## 2020-06-04 NOTE — Progress Notes (Signed)
General Surgery Follow Up Note  Subjective:    Overnight Issues:   Objective:  Vital signs for last 24 hours: Temp:  [97.4 F (36.3 C)-97.6 F (36.4 C)] 97.4 F (36.3 C) (09/16 1445) Pulse Rate:  [64-73] 64 (09/16 1500) Resp:  [15-19] 18 (09/16 1500) BP: (111-128)/(46-60) 118/47 (09/16 1500) SpO2:  [97 %-100 %] 97 % (09/16 1500) Arterial Line BP: (152-171)/(42-57) 158/57 (09/16 1445)  Hemodynamic parameters for last 24 hours:    Intake/Output from previous day: 09/15 0701 - 09/16 0700 In: 2629.5 [I.V.:881.5; IV Piggyback:1748.1] Out: 1400 [Urine:1300; Stool:100]  Intake/Output this shift: Total I/O In: 1245 [I.V.:630; Blood:315; IV Piggyback:300] Out: 600 [Urine:350; Blood:250]  Vent settings for last 24 hours:    Physical Exam:  Gen: comfortable, no distress Neuro: non-focal exam HEENT: PERRL Neck: supple CV: RRR Pulm: unlabored breathing Abd: soft, NT GU: clear yellow urine Extr: wwp, no edema   Results for orders placed or performed during the hospital encounter of 06/01/20 (from the past 24 hour(s))  Basic metabolic panel     Status: Abnormal   Collection Time: 06/03/20  3:51 PM  Result Value Ref Range   Sodium 138 135 - 145 mmol/L   Potassium 3.1 (L) 3.5 - 5.1 mmol/L   Chloride 107 98 - 111 mmol/L   CO2 22 22 - 32 mmol/L   Glucose, Bld 91 70 - 99 mg/dL   BUN 5 (L) 8 - 23 mg/dL   Creatinine, Ser <0.30 (L) 0.61 - 1.24 mg/dL   Calcium 9.5 8.9 - 10.3 mg/dL   GFR calc non Af Amer NOT CALCULATED >60 mL/min   GFR calc Af Amer NOT CALCULATED >60 mL/min   Anion gap 9 5 - 15  Glucose, capillary     Status: None   Collection Time: 06/03/20  4:45 PM  Result Value Ref Range   Glucose-Capillary 79 70 - 99 mg/dL  Glucose, capillary     Status: None   Collection Time: 06/03/20  8:54 PM  Result Value Ref Range   Glucose-Capillary 85 70 - 99 mg/dL  Glucose, capillary     Status: None   Collection Time: 06/04/20  7:55 AM  Result Value Ref Range    Glucose-Capillary 88 70 - 99 mg/dL  Surgical pcr screen     Status: Abnormal   Collection Time: 06/04/20  9:13 AM   Specimen: Nasal Mucosa; Nasal Swab  Result Value Ref Range   MRSA, PCR NEGATIVE NEGATIVE   Staphylococcus aureus POSITIVE (A) NEGATIVE  Glucose, capillary     Status: Abnormal   Collection Time: 06/04/20  9:50 AM  Result Value Ref Range   Glucose-Capillary 105 (H) 70 - 99 mg/dL  Prepare RBC (crossmatch)     Status: None   Collection Time: 06/04/20 11:05 AM  Result Value Ref Range   Order Confirmation      ORDER PROCESSED BY BLOOD BANK Performed at [facility], [place] [address], [place], [place] [address]   Type and screen [place]     Status: None (Preliminary result)   Collection Time: 06/04/20 11:05 AM  Result Value Ref Range   ABO/RH(D) O POS    Antibody Screen NEG    Sample Expiration 06/07/2020,2359    Unit Number [id number]    Blood Component Type RBC LR PHER1    Unit division 00    Status of Unit ALLOCATED    Transfusion Status OK TO TRANSFUSE    Crossmatch Result  Compatible Performed at [facility], [place] [address] [place], [place] [address]    Unit Number [id number]    Blood Component Type RED CELLS,LR    Unit division 00    Status of Unit ISSUED    Transfusion Status OK TO TRANSFUSE    Crossmatch Result Compatible    Unit Number [id number]    Blood Component Type RED CELLS,LR    Unit division 00    Status of Unit ALLOCATED    Transfusion Status OK TO TRANSFUSE    Crossmatch Result Compatible    Unit Number [id number]    Blood Component Type RED CELLS,LR    Unit division 00    Status of Unit ALLOCATED    Transfusion Status OK TO TRANSFUSE    Crossmatch Result Compatible   ABO/Rh     Status: None   Collection Time: 06/04/20 11:09 AM  Result Value Ref Range   ABO/RH(D)      O POS Performed at [place] [address], [place], [place] [address]   I-STAT, [place] 8     Status:  Abnormal   Collection Time: 06/04/20 12:17 PM  Result Value Ref Range   Sodium 137 135 - 145 mmol/L   Potassium 3.1 (L) 3.5 - 5.1 mmol/L   Chloride 102 98 - 111 mmol/L   BUN 5 (L) 8 - 23 mg/dL   Creatinine, Ser <0.20 (L) 0.61 - 1.24 mg/dL   Glucose, Bld 104 (H) 70 - 99 mg/dL   Calcium, Ion 1.45 (H) 1.15 - 1.40 mmol/L   TCO2 21 (L) 22 - 32 mmol/L   Hemoglobin 8.2 (L) 13.0 - 17.0 g/dL   HCT 24.0 (L) 39 - 52 %  I-STAT, chem 8     Status: Abnormal   Collection Time: 06/04/20  1:56 PM  Result Value Ref Range   Sodium 138 135 - 145 mmol/L   Potassium 3.0 (L) 3.5 - 5.1 mmol/L   Chloride 103 98 - 111 mmol/L   BUN 5 (L) 8 - 23 mg/dL   Creatinine, Ser <0.20 (L) 0.61 - 1.24 mg/dL   Glucose, Bld 112 (H) 70 - 99 mg/dL   Calcium, Ion 1.40 1.15 - 1.40 mmol/L   TCO2 21 (L) 22 - 32 mmol/L   Hemoglobin 8.2 (L) 13.0 - 17.0 g/dL   HCT 24.0 (L) 39 - 52 %  Glucose, capillary     Status: Abnormal   Collection Time: 06/04/20  2:16 PM  Result Value Ref Range   Glucose-Capillary 105 (H) 70 - 99 mg/dL   Comment 1 Notify RN    Comment 2 Document in Chart     Assessment & Plan:  Present on Admission: . Hyperlipidemia . Essential hypertension . Transverse myelitis ([place])    LOS: 3 days   Additional comments:I reviewed the patient's new clinical lab test results.   and I reviewed the patients new imaging test results.    30M with large stage 4 sacral decubitus ulcer with obvious fecal contamination and insensate.   Stage 4 sacral decubitus ulcer and FTT - to OR today for debridement, diverting loop colostomy, and gastrostomy tube placement. Informed consent obtained from the wife. FEN - may start tube feed four hours post-procedure DVT - SCDs   Jesusita Oka, MD Trauma & General Surgery Please use [email or web address] to contact on call provider  06/04/2020  *Care during the described time interval was provided by me. I have reviewed this patient's available data, including medical  history, events  of note, physical examination and test results as part of my evaluation.

## 2020-06-04 NOTE — Anesthesia Procedure Notes (Signed)
Arterial Line Insertion Start/End9/16/2021 11:30 AM, 06/04/2020 11:30 AM Performed by: CRNA  Patient location: Pre-op. Preanesthetic checklist: patient identified, IV checked, site marked, risks and benefits discussed, surgical consent, monitors and equipment checked, pre-op evaluation, timeout performed and anesthesia consent Lidocaine 1% used for infiltration Left, radial was placed Catheter size: 20 G Hand hygiene performed , maximum sterile barriers used  and Seldinger technique used Allen's test indicative of satisfactory collateral circulation Attempts: 1 Procedure performed without using ultrasound guided technique. Following insertion, dressing applied and Biopatch. Post procedure assessment: normal  Patient tolerated the procedure well with no immediate complications.

## 2020-06-04 NOTE — Op Note (Signed)
   Operative Note   Date: 06/04/2020  Procedure: laparoscopic loop sigmoid colostomy,  laparoscopic gastrostomy tube placement, lysis of adhesions (15 minutes), debridement of sacral decubitus ulcer (14cm x 14cm x 3cm)  Pre-op diagnosis: stage 4 sacral decubitus ulcer, dysphagia and failure to thrive Post-op diagnosis: same, sacral osteomyelitis  Indication and clinical history: The patient is a 74 y.o. year old male with stage 4 sacral decubitus ulcer, dysphagia, and failure to thrive   Surgeon: Jesusita Oka, MD Assistant: Grandville Silos, MD  Anesthesiologist: Linna Caprice, MD Anesthesia: General  Findings:  . Specimen: aerobic and anaerobic cultures . EBL: 25cc . Drains/Implants: gastrostomy tube placement, bridge for colostomy  Disposition: PACU - hemodynamically stable.  Description of procedure: The patient was positioned supine on the operating room table. General anesthetic induction and intubation were uneventful. The abdomen was prepped and draped in the usual sterile fashion. Time-out was performed verifying correct patient, procedure, signature of informed consent, and administration of pre-operative antibiotics.   A Veress needle was used at Palmer's point to insufflate the abdomen and an optiview technique used to enter the peritoneal cavity. A second 56mm port was placed in the right abdomen under direct visualization. After identifying an appropriate seating site, a loop of sigmoid colon was brought up to the abdominal wall. The ostomy site was created and the loop brought through the fascia. Next the abdomen was re-insufflated and the stomach identified and brought up to the abdominal wall. An additional 23mm port was placed under direct visualization to aid in lysis of adhesions for approximately 15 minutes. The incision at this port site was extended and the stomach brought through the abdominal wall. The stomach was sutured to the fascia at three points using 2-0 vicryl suture. A  pursestring was placed and a gastrotomy created, through which the gastrostomy tube was placed. The pursestring was cinched down and the balloon inflated. The incision was closed with two 2-0 vicryl sutures in an interrupted fashion and the bumper secured. Three 3-0 nylon sutures were used to secure the bumper at three points. Next, a red rubber catheter was placed in the mesentery of the loop of colon as a bridge and the colostomy was matured. The bridge was sutured in place and an ostomy appliance placed. The port sites were closed with 4-0 monocryl suture and dressed with dermabond.   The patient was then repositioned to prone and the sacrum prepped and draped. There was obvious purulent drainage and aerobic and anaerobic cultures were sampled as specimens. The wound was debrided using a combination of sharp dissection, curettage, and electrocautery and the wound extended down into the bone. Next a pulse lavage was used to irrigate the wound. The wound was then packed wet to dry with two kerlix covered by an ABD pad as sterile dressing.   All sponge and instrument counts were correct at the conclusion of the procedure. The patient was awakened from anesthesia, extubated uneventfully, and transported to the PACU in good condition. There were no complications.    Jesusita Oka, MD General and [facility]

## 2020-06-04 NOTE — Anesthesia Procedure Notes (Signed)
Central Venous Catheter Insertion Performed by: Roberts Gaudy, MD, anesthesiologist Start/End9/16/2021 10:30 AM, 06/04/2020 10:35 AM Patient location: Pre-op. Preanesthetic checklist: patient identified, IV checked, site marked, risks and benefits discussed, surgical consent, monitors and equipment checked, pre-op evaluation, timeout performed and anesthesia consent Lidocaine 1% used for infiltration and patient sedated Hand hygiene performed  and maximum sterile barriers used  Catheter size: 8 Fr Total catheter length 16. Central line was placed.Double lumen Procedure performed using ultrasound guided technique. Ultrasound Notes:image(s) printed for medical record Attempts: 1 Following insertion, dressing applied and line sutured. Post procedure assessment: blood return through all ports  Patient tolerated the procedure well with no immediate complications.

## 2020-06-04 NOTE — Anesthesia Procedure Notes (Deleted)
Central Venous Catheter Insertion Performed by: Roberts Gaudy, MD, anesthesiologist Start/End9/16/2021 11:30 AM, 06/04/2020 11:40 AM Patient location: Pre-op. Preanesthetic checklist: patient identified, IV checked, site marked, risks and benefits discussed, surgical consent, monitors and equipment checked, pre-op evaluation, timeout performed and anesthesia consent Lidocaine 1% used for infiltration and patient sedated Hand hygiene performed  and maximum sterile barriers used  Catheter size: 8 Fr Total catheter length 16. Central line was placed.Double lumen Procedure performed using ultrasound guided technique. Ultrasound Notes:image(s) printed for medical record Attempts: 1 Following insertion, dressing applied and line sutured. Post procedure assessment: blood return through all ports  Patient tolerated the procedure well with no immediate complications.

## 2020-06-04 NOTE — Anesthesia Procedure Notes (Signed)
Procedure Name: Intubation Date/Time: 06/04/2020 11:21 AM Performed by: Renato Shin, CRNA Pre-anesthesia Checklist: Patient identified, Emergency Drugs available, Suction available and Patient being monitored Patient Re-evaluated:Patient Re-evaluated prior to induction Oxygen Delivery Method: Circle system utilized Preoxygenation: Pre-oxygenation with 100% oxygen Induction Type: IV induction Ventilation: Mask ventilation without difficulty and Oral airway inserted - appropriate to patient size Laryngoscope Size: Miller and 3 Grade View: Grade II Tube type: Oral Tube size: 8.0 mm Number of attempts: 1 Airway Equipment and Method: Stylet and Oral airway Placement Confirmation: ETT inserted through vocal cords under direct vision,  positive ETCO2 and breath sounds checked- equal and bilateral Secured at: 21 cm Tube secured with: Tape Dental Injury: Teeth and Oropharynx as per pre-operative assessment

## 2020-06-04 NOTE — Progress Notes (Addendum)
Nutrition Follow-up  DOCUMENTATION CODES:   Non-severe (moderate) malnutrition in context of chronic illness, Obesity unspecified  INTERVENTION:   -D/c Ensure Enlive po BID, each supplement provides 350 kcal and 20 grams of protein -D/c Prosource Plus  -Initiate Osmolite 1.5 @ 20 ml/hr via g-tube and increase by 10 ml every 8 hours to goal rate of 60 ml/hr.   90 ml Prosource Plus BID.    Tube feeding regimen provides 2280 kcal (100% of needs), 134 grams of protein, and 1097 ml of H2O.   -1 packet Juven BID per tube, each packet provides 95 calories, 2.5 grams of protein (collagen), and 9.8 grams of carbohydrate (3 grams sugar); also contains 7 grams of L-arginine and L-glutamine, 300 mg vitamin C, 15 mg vitamin E, 1.2 mcg vitamin B-12, 9.5 mg zinc, 200 mg calcium, and 1.5 g  Calcium Beta-hydroxy-Beta-methylbutyrate to support wound healing  -Recommend monitoring Mg, K, and Phos daily x 3 days due to high refeeding risk  NUTRITION DIAGNOSIS:   Moderate Malnutrition related to chronic illness (transvere myelitis) as evidenced by energy intake < 75% for > or equal to 1 month, moderate fat depletion, mild fat depletion, mild muscle depletion, moderate muscle depletion.  Ongoing  GOAL:   Patient will meet greater than or equal to 90% of their needs  Progressing   MONITOR:   Diet advancement, Labs, Weight trends, TF tolerance, Skin, I & O's  REASON FOR ASSESSMENT:   Consult Enteral/tube feeding initiation and management  ASSESSMENT:   Brent [name]. is a 74 y.o. male with medical history significant of recent diagnosis of transverse myelitis status post rituximab, paraplegia secondary to transverse myelitis, HTN, HLD, IDDM, CAD, presented with trouble eating and failure to thrive.  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  Patient then underwent inpatient rehab but his further recovery has been hampered by his nutrition status.  Looks like patient developed mild dysphagia and dryness in the mouth on last admission, patient told me that has never went away.  Since last week, his swallow condition worsened, same time, his appetite also dropped significantly.  He told me that even swallow at the point water caused "soreness on the back of the mouth".  9/14- rectal tube placed, s/p BSE- recommending full liquid diet 9/16- s/p Procedure: laparoscopic loop sigmoid colostomy,  laparoscopic gastrostomy tube placement, lysis of adhesions (15 minutes), debridement of sacral decubitus ulcer (14cm x 14cm x 3cm)  Reviewed I/O's: +1.2 L x 24 hours and +3.2 L since admission  Rectal tube output: 100 ml x 24 hours  UOP: 1.3 L x 24 hours  Spoke with pt at bedside, who reports feeling well today (just returned from surgery). He confirms that he had difficulty swallowing and poor appetite over the past several weeks. He was able to acknowledge that he had surgery for feeding tube placement and is eager to start TF to optimize nutrition.   Pt unsure how much weight he lost or what his UBW, but is certain he has lost weight.   Per MD notes, plan to initiate TF at 1800 per general surgery.   Medications reviewed and include vitamin C, marinol, diflucan, florastor, zinc sulfate, lactated ringers infusion @ 75 ml/hr, and vitamin B-12.   Labs reviewed: K: 3.0, CBGS: 105 (inpatient orders for glycemic control are 0-15 units inuslin  aspart TID with meals).   NUTRITION - FOCUSED PHYSICAL EXAM:    Most Recent Value  Orbital Region Moderate depletion  Upper Arm Region Mild depletion  Thoracic and Lumbar Region Mild depletion  Buccal Region Moderate depletion  Temple Region Moderate depletion  Clavicle Bone Region Moderate depletion  Clavicle and Acromion Bone Region Moderate depletion  Scapular  Bone Region Moderate depletion  Dorsal Hand Moderate depletion  Patellar Region Moderate depletion  Anterior Thigh Region Moderate depletion  Posterior Calf Region Moderate depletion  Edema (RD Assessment) Mild  Hair Reviewed  Eyes Reviewed  Mouth Reviewed  Skin Reviewed  Nails Reviewed       Diet Order:   Diet Order            Diet NPO time specified  Diet effective midnight                 EDUCATION NEEDS:   No education needs have been identified at this time  Skin:  Skin Assessment: Skin Integrity Issues: Skin Integrity Issues:: DTI, Unstageable DTI: lt heel Unstageable: sacrum, highly necrotic  Last BM:  06/04/20 (via rectal tube)  Height:   Ht Readings from Last 1 Encounters:  06/01/20 6' (1.829 m)    Weight:   Wt Readings from Last 1 Encounters:  06/01/20 108 kg    Ideal Body Weight:  74.8 kg (adjusted for paraplegia)  BMI:  Body mass index is 32.28 kg/m.  Estimated Nutritional Needs:   Kcal:  4935-5217  Protein:  125-150 grams  Fluid:  > 2 L    Loistine Chance, RD, LDN, [place] Registered Dietitian II Certified Diabetes Care and Education Specialist Please refer to [facility] for RD and/or RD on-call/weekend/after hours pager

## 2020-06-04 NOTE — Telephone Encounter (Signed)
Called and spoke with receptionist at [facility]. She transferred me to nurse hall again. Phone continued to ring, unable to LVM. I tried calling [employer] back, phone continued to ring.

## 2020-06-04 NOTE — Social Work (Signed)
TOC team following for post surgical process for SNF placement.   Westley Hummer, MSW, [place] Work

## 2020-06-04 NOTE — Anesthesia Preprocedure Evaluation (Addendum)
Anesthesia Evaluation  Patient identified by MRN, date of birth, ID band Patient awake    Reviewed: Allergy & Precautions, NPO status , Patient's Chart, lab work & pertinent test results  Airway Mallampati: III  TM Distance: >3 FB     Dental  (+) Teeth Intact, Dental Advisory Given   Pulmonary former smoker,    breath sounds clear to auscultation       Cardiovascular hypertension,  Rhythm:Regular Rate:Normal     Neuro/Psych    GI/Hepatic   Endo/Other  diabetes  Renal/GU      Musculoskeletal   Abdominal   Peds  Hematology   Anesthesia Other Findings   Reproductive/Obstetrics                             Anesthesia Physical Anesthesia Plan  ASA: III  Anesthesia Plan: General   Post-op Pain Management:    Induction: Intravenous  PONV Risk Score and Plan:   Airway Management Planned: Oral ETT  Additional Equipment: Arterial line and CVP  Intra-op Plan:   Post-operative Plan: Possible Post-op intubation/ventilation  Informed Consent: I have reviewed the patients History and Physical, chart, labs and discussed the procedure including the risks, benefits and alternatives for the proposed anesthesia with the patient or authorized representative who has indicated his/her understanding and acceptance.     Dental advisory given  Plan Discussed with: CRNA and Anesthesiologist  Anesthesia Plan Comments:         Anesthesia Quick Evaluation

## 2020-06-04 NOTE — Transfer of Care (Signed)
Immediate Anesthesia Transfer of Care Note  Patient: Markie Frith.  Procedure(s) Performed: LAPAROSCOPY ASSISTED COLOSTOMY, LAPAROSCOPY GASTROSTOMY (N/A Abdomen) IRRIGATION AND DEBRIDEMENT SACRAL WOUND (N/A Buttocks) LYSIS OF ADHESION (N/A Abdomen)  Patient Location: PACU  Anesthesia Type:General  Level of Consciousness: awake and patient cooperative  Airway & Oxygen Therapy: Patient Spontanous Breathing and Patient connected to face mask oxygen  Post-op Assessment: Report given to RN and Post -op Vital signs reviewed and stable  Post vital signs: Reviewed and stable  Last Vitals:  Vitals Value Taken Time  BP 118/46 06/04/20 1414  Temp    Pulse 66 06/04/20 1419  Resp 17 06/04/20 1419  SpO2 100 % 06/04/20 1419  Vitals shown include unvalidated device data.  Last Pain:  Vitals:   06/04/20 0952  TempSrc:   PainSc: Asleep      Patients Stated Pain Goal: 2 (21/78/37 5423)  Complications: No complications documented.

## 2020-06-04 NOTE — Telephone Encounter (Signed)
[place] again and spoke with [facility]. She transferred me to nurse hall. I was able to speak with Vaughan Basta. She states they did not receive orders from 05/21/20. Fax for them: [phone]. Labs were not collected. She advised pt now in the hospital. Advised I will let Dr. Felecia Shelling know. We were aware he was now in hospital.

## 2020-06-04 NOTE — Telephone Encounter (Signed)
I have asked Dr. Lonny Prude, the hospitalist seeing him this week, to check the necessary labs while he is at [facility]

## 2020-06-04 NOTE — Progress Notes (Signed)
SLP Cancellation Note  Patient Details Name: Brent [name]. MRN: [id number] DOB: 01/22/1946   Cancelled treatment:        Pt currently in surgery. Will continue efforts.   Houston Siren 06/04/2020, 11:04 AM   Orbie Pyo Colvin Caroli.Ed Risk analyst [phone] Office [phone]

## 2020-06-04 NOTE — Progress Notes (Signed)
Patient taken to surgery, report called to short stay.

## 2020-06-05 ENCOUNTER — Encounter (HOSPITAL_COMMUNITY): Payer: Self-pay | Admitting: Surgery

## 2020-06-05 DIAGNOSIS — K6812 Psoas muscle abscess: Secondary | ICD-10-CM

## 2020-06-05 DIAGNOSIS — M4646 Discitis, unspecified, lumbar region: Secondary | ICD-10-CM

## 2020-06-05 DIAGNOSIS — R7881 Bacteremia: Secondary | ICD-10-CM | POA: Diagnosis present

## 2020-06-05 DIAGNOSIS — A498 Other bacterial infections of unspecified site: Secondary | ICD-10-CM

## 2020-06-05 LAB — CBC
HCT: 26.8 % — ABNORMAL LOW (ref 39.0–52.0)
Hemoglobin: 8.3 g/dL — ABNORMAL LOW (ref 13.0–17.0)
MCH: 25.5 pg — ABNORMAL LOW (ref 26.0–34.0)
MCHC: 31 g/dL (ref 30.0–36.0)
MCV: 82.2 fL (ref 80.0–100.0)
Platelets: 297 10*3/uL (ref 150–400)
RBC: 3.26 MIL/uL — ABNORMAL LOW (ref 4.22–5.81)
RDW: 14 % (ref 11.5–15.5)
WBC: 5.3 10*3/uL (ref 4.0–10.5)
nRBC: 0 % (ref 0.0–0.2)

## 2020-06-05 LAB — CULTURE, BLOOD (ROUTINE X 2): Special Requests: ADEQUATE

## 2020-06-05 LAB — BASIC METABOLIC PANEL
Anion gap: 11 (ref 5–15)
BUN: 7 mg/dL — ABNORMAL LOW (ref 8–23)
CO2: 21 mmol/L — ABNORMAL LOW (ref 22–32)
Calcium: 8.8 mg/dL — ABNORMAL LOW (ref 8.9–10.3)
Chloride: 105 mmol/L (ref 98–111)
Creatinine, Ser: 0.32 mg/dL — ABNORMAL LOW (ref 0.61–1.24)
GFR calc Af Amer: >60 mL/min (ref >60)
GFR calc non Af Amer: >60 mL/min (ref >60)
Glucose, Bld: 167 mg/dL — ABNORMAL HIGH (ref 70–99)
Potassium: 2.8 mmol/L — ABNORMAL LOW (ref 3.5–5.1)
Sodium: 137 mmol/L (ref 135–145)

## 2020-06-05 LAB — GLUCOSE, CAPILLARY
Glucose-Capillary: 151 mg/dL — ABNORMAL HIGH (ref 70–99)
Glucose-Capillary: 153 mg/dL — ABNORMAL HIGH (ref 70–99)
Glucose-Capillary: 189 mg/dL — ABNORMAL HIGH (ref 70–99)
Glucose-Capillary: 204 mg/dL — ABNORMAL HIGH (ref 70–99)
Glucose-Capillary: 207 mg/dL — ABNORMAL HIGH (ref 70–99)
Glucose-Capillary: 210 mg/dL — ABNORMAL HIGH (ref 70–99)
Glucose-Capillary: 234 mg/dL — ABNORMAL HIGH (ref 70–99)

## 2020-06-05 LAB — PHOSPHORUS: Phosphorus: 2.8 mg/dL (ref 2.5–4.6)

## 2020-06-05 LAB — MAGNESIUM: Magnesium: 1.6 mg/dL — ABNORMAL LOW (ref 1.7–2.4)

## 2020-06-05 MED ORDER — DIPHENHYDRAMINE HCL 25 MG PO CAPS: 25.0000 mg | ORAL_CAPSULE | Freq: Four times a day (QID) | ORAL | Status: DC | PRN

## 2020-06-05 MED ORDER — POTASSIUM CHLORIDE 10 MEQ/100ML IV SOLN
10.0000 meq | INTRAVENOUS | Status: AC
  Administered 2020-06-05 (×4): 10 meq via INTRAVENOUS
  Filled 2020-06-05 (×4): qty 100

## 2020-06-05 MED ORDER — MAGNESIUM SULFATE 4 GM/100ML IV SOLN
4.0000 g | Freq: Once | INTRAVENOUS | Status: AC
  Administered 2020-06-05: 4 g via INTRAVENOUS
  Filled 2020-06-05: qty 100

## 2020-06-05 MED ORDER — DIPHENHYDRAMINE HCL 50 MG/ML IJ SOLN: 25.0000 mg | Freq: Four times a day (QID) | INTRAMUSCULAR | Status: DC | PRN

## 2020-06-05 MED ORDER — POTASSIUM CHLORIDE 10 MEQ/100ML IV SOLN
10.0000 meq | INTRAVENOUS | Status: AC
  Administered 2020-06-05 (×2): 10 meq via INTRAVENOUS
  Filled 2020-06-05 (×2): qty 100

## 2020-06-05 MED ORDER — SODIUM CHLORIDE 0.9% FLUSH: 10.0000 mL | INTRAVENOUS | Status: DC | PRN

## 2020-06-05 MED ORDER — INSULIN ASPART 100 UNIT/ML ~~LOC~~ SOLN
0.0000 [IU] | SUBCUTANEOUS | Status: DC
  Administered 2020-06-05 (×3): 5 [IU] via SUBCUTANEOUS
  Administered 2020-06-06: 3 [IU] via SUBCUTANEOUS
  Administered 2020-06-06: 8 [IU] via SUBCUTANEOUS
  Administered 2020-06-06 (×3): 5 [IU] via SUBCUTANEOUS
  Administered 2020-06-07: 3 [IU] via SUBCUTANEOUS
  Administered 2020-06-07: 5 [IU] via SUBCUTANEOUS
  Administered 2020-06-07: 3 [IU] via SUBCUTANEOUS
  Administered 2020-06-07: 5 [IU] via SUBCUTANEOUS
  Administered 2020-06-07: 3 [IU] via SUBCUTANEOUS
  Administered 2020-06-07: 5 [IU] via SUBCUTANEOUS
  Administered 2020-06-08: 3 [IU] via SUBCUTANEOUS
  Administered 2020-06-08: 5 [IU] via SUBCUTANEOUS
  Administered 2020-06-08: 3 [IU] via SUBCUTANEOUS
  Administered 2020-06-08 (×2): 5 [IU] via SUBCUTANEOUS
  Administered 2020-06-08 – 2020-06-09 (×4): 3 [IU] via SUBCUTANEOUS
  Administered 2020-06-09: 5 [IU] via SUBCUTANEOUS
  Administered 2020-06-09 (×2): 3 [IU] via SUBCUTANEOUS
  Administered 2020-06-10: 2 [IU] via SUBCUTANEOUS
  Administered 2020-06-10: 3 [IU] via SUBCUTANEOUS
  Administered 2020-06-10: 2 [IU] via SUBCUTANEOUS
  Administered 2020-06-10: 5 [IU] via SUBCUTANEOUS
  Administered 2020-06-10: 3 [IU] via SUBCUTANEOUS

## 2020-06-05 MED ORDER — SODIUM CHLORIDE 0.9 % IV SOLN
3.0000 g | Freq: Four times a day (QID) | INTRAVENOUS | Status: DC
  Administered 2020-06-05 – 2020-06-10 (×20): 3 g via INTRAVENOUS
  Filled 2020-06-05 (×3): qty 3
  Filled 2020-06-05: qty 8
  Filled 2020-06-05 (×5): qty 3
  Filled 2020-06-05: qty 8
  Filled 2020-06-05 (×6): qty 3
  Filled 2020-06-05: qty 8
  Filled 2020-06-05 (×2): qty 3
  Filled 2020-06-05: qty 8
  Filled 2020-06-05 (×4): qty 3

## 2020-06-05 NOTE — Progress Notes (Signed)
[facility] Progress Note  1 Day Post-Op  Subjective: Patient comfortable and denies abdominal pain. Has eaten breakfast this AM. Insensate to sacral region.   Objective: Vital signs in last 24 hours: Temp:  [97.4 F (36.3 C)-98.6 F (37 C)] 97.5 F (36.4 C) (09/17 0403) Pulse Rate:  [64-72] 64 (09/17 0403) Resp:  [15-19] 16 (09/17 0403) BP: (111-129)/(42-62) 120/54 (09/17 0403) SpO2:  [96 %-100 %] 96 % (09/17 0403) Arterial Line BP: (152-171)/(42-57) 158/57 (09/16 1445) Weight:  [120.2 kg] 120.2 kg (09/17 0403) Last BM Date: 06/04/20  Intake/Output from previous day: 09/16 0701 - 09/17 0700 In: 1445 [I.V.:630; Blood:315; IV Piggyback:300] Out: 1500 [Urine:1250; Blood:250] Intake/Output this shift: Total I/O In: 120 [P.O.:120] Out: -   PE: General: pleasant, WD, chronically ill appearing male who is laying in bed in NAD Heart: regular, rate, and rhythm. Palpable pedal pulses bilaterally Lungs: Respiratory effort nonlabored Abd: soft, NT, ND, +BS, loop colostomy with some liquid stool output, gastrostomy tube present without any drainage around and TF @ 20 cc/h GU: sacral wound with some bloody drainage but no purulence noted, exposed sacral bone in wound bed   Lab Results:  Recent Labs    06/02/20 1607 06/02/20 1607 06/04/20 1217 06/04/20 1356  WBC 5.9  --   --   --   HGB 7.2*   < > 8.2* 8.2*  HCT 23.4*   < > 24.0* 24.0*  PLT 381  --   --   --    < > = values in this interval not displayed.   BMET Recent Labs    06/03/20 1551 06/04/20 1217 06/04/20 1356 06/05/20 0432  NA 138   < > 138 137  K 3.1*   < > 3.0* 2.8*  CL 107   < > 103 105  CO2 22  --   --  21*  GLUCOSE 91   < > 112* 167*  BUN 5*   < > 5* 7*  CREATININE <0.30*   < > <0.20* 0.32*  CALCIUM 9.5  --   --  8.8*   < > = values in this interval not displayed.   PT/INR No results for input(s): LABPROT, INR in the last 72 hours. CMP     Component Value Date/Time   NA 137  06/05/2020 0432   NA 140 04/11/2019 1028   K 2.8 (L) 06/05/2020 0432   CL 105 06/05/2020 0432   CO2 21 (L) 06/05/2020 0432   GLUCOSE 167 (H) 06/05/2020 0432   BUN 7 (L) 06/05/2020 0432   BUN 21 04/11/2019 1028   CREATININE 0.32 (L) 06/05/2020 0432   CREATININE 0.87 04/15/2016 1133   CALCIUM 8.8 (L) 06/05/2020 0432   PROT 5.3 (L) 06/01/2020 1248   PROT 6.6 01/20/2020 0754   ALBUMIN 1.5 (L) 06/01/2020 1248   ALBUMIN 4.2 03/10/2020 1757   AST 62 (H) 06/01/2020 1248   ALT 68 (H) 06/01/2020 1248   ALKPHOS 134 (H) 06/01/2020 1248   BILITOT 0.7 06/01/2020 1248   BILITOT 0.3 01/20/2020 0754   GFRNONAA >60 06/05/2020 0432   GFRNONAA 87 04/15/2016 1133   GFRAA >60 06/05/2020 0432   GFRAA >89 04/15/2016 1133   Lipase     Component Value Date/Time   LIPASE 29 03/08/2020 1142       Studies/Results: DG CHEST PORT 1 VIEW  Result Date: 06/04/2020 CLINICAL DATA:  PICC line placement EXAM: PORTABLE CHEST 1 VIEW COMPARISON:  None. FINDINGS: The heart size and mediastinal contours  are within normal limits. A left-sided central venous catheter seen at the SVC/brachiocephalic junction. Aortic knob calcifications are seen. Both lungs are clear. The visualized skeletal structures are unremarkable. IMPRESSION: Left-sided central venous catheter is seen with the tip in the SVC brachiocephalic junction. Electronically Signed   By: Prudencio Pair M.D.   On: 06/04/2020 16:27    Anti-infectives: Anti-infectives (From admission, onward)   Start     Dose/Rate Route Frequency Ordered Stop   06/02/20 1000  fluconazole (DIFLUCAN) tablet 100 mg        100 mg Oral Daily 06/01/20 1815 06/16/20 0959   06/02/20 0200  vancomycin (VANCOCIN) IVPB 1000 mg/200 mL premix        1,000 mg 200 mL/hr over 60 Minutes Intravenous Every 12 hours 06/01/20 1739     06/01/20 2200  ceFEPIme (MAXIPIME) 2 g in sodium chloride 0.9 % 100 mL IVPB        2 g 200 mL/hr over 30 Minutes Intravenous Every 8 hours 06/01/20 1418      06/01/20 1830  fluconazole (DIFLUCAN) tablet 200 mg        200 mg Oral  Once 06/01/20 1815 06/02/20 1701   06/01/20 1700  metroNIDAZOLE (FLAGYL) IVPB 500 mg        500 mg 100 mL/hr over 60 Minutes Intravenous Every 8 hours 06/01/20 1534     06/01/20 1245  ceFEPIme (MAXIPIME) 2 g in sodium chloride 0.9 % 100 mL IVPB        2 g 200 mL/hr over 30 Minutes Intravenous  Once 06/01/20 1233 06/01/20 1330   06/01/20 1230  vancomycin (VANCOREADY) IVPB 2000 mg/400 mL        2,000 mg 200 mL/hr over 120 Minutes Intravenous  Once 06/01/20 1229 06/01/20 1729       Assessment/Plan HTN HLD Transverse myelitis with paraplegia  Thoracic discitis FTT Hypokalemia  Dysphagia  S/p gastrostomy tube placement 9/16 Dr. Bobbye Morton - tolerating TF, advance to goal as tolerated - ok to use for meds as well Sacral wound with osteomyelitis S/p I&D and loop diverting colostomy 9/16 Dr. Bobbye Morton - BID dressing changes with NS for now, may need to do some Dakin's if tissue looking iffy over the weekend - ok to d/c rectal tube whenever output has slowed since he is diverted proximally - WOC consult for new stoma - small amount of bleeding from sacral wound bed, check CBC again today  FEN: D3 diet, TF at 20 cc/h, ordered some runs of K for hypokalemia and notified primary attending  VTE: lovenox ID: [email or web address]    LOS: 4 days    Norm Parcel , [facility] 06/05/2020, 9:25 AM Please see Amion for pager number during day hours 7:00am-4:30pm

## 2020-06-05 NOTE — Progress Notes (Signed)
  Speech Language Pathology Treatment: Dysphagia  Patient Details Name: Brent [name]. MRN: [id number] DOB: 19-Jul-1946 Today's Date: 06/05/2020 Time: 7353-2992 SLP Time Calculation (min) (ACUTE ONLY): 30 min  Assessment / Plan / Recommendation Clinical Impression  Patient seen to address dysphagia goals with close friend/pastor of patient present for some education regarding patient's current PO intake and confusion. Patient alert and verbal but very confused, asking SLP if there was someone standing behind when no one was there, thinking he saw one of the gloves flying out of the box. Patient did accept an ice chip and several small straw sips and did not exhibit any overt s/s of aspiration or penetration. He refused all solids offered. Per RN, patient has not been eating much by mouth and he has been very confused all day. He is currently getting PEG tube feedings (PEG surgery yesterday) secondary to sacral wound healing and patient being FTT. Patient requires max encouragement for PO intake.    HPI HPI: 74 y.o. male  with past medical history of recently diagnosed transverse myelitis and paraplegia secondary to transverse myelitis returns to ED on 06/01/2020 with difficulty eating, failure to thrive, and wounds on sacrum and B feet. Was on CIR 7/7-8/6 and then to [facility]. Other PMH: OSA, HTN, CAD, DM.      SLP Plan  Continue with current plan of care       Recommendations  Medication Administration: Crushed with puree Supervision: Full supervision/cueing for compensatory strategies;Staff to assist with self feeding Compensations: Minimize environmental distractions;Slow rate;Small sips/bites                Oral Care Recommendations: Oral care BID Follow up Recommendations: Other (comment) SLP Visit Diagnosis: Dysphagia, unspecified (R13.10) Plan: Continue with current plan of care       [place], MA, [place] [facility] Acute  Rehab

## 2020-06-05 NOTE — Progress Notes (Addendum)
Pharmacy Antibiotic Note  Brent [name]. is a 74 y.o. male admitted on 06/01/2020 with Stg 4 wound infxn w/ abscess/multilocular fluid, discitis osteomyelitis.  Pharmacy has been consulted for cefepime and vancomycin dosing. Patient is also on metronidazole for abscess and fluconazole for thrush.  Pt is afebrile and WBC is WNL. SCr is stable.   Patient missed 9/16 PM vanc dose due to being in OR. Will check trough tomorrow.   ADDENDUM: Vanc and metronidazole DC'd per MD. ID consulted and changed cefepime to amp/sulb. Note hx hives/rash to PCN, has not received any PCN abx here. MD ok to add benadryl PRN allergic reaction.  Plan: Start amp/sulb 3g Q 6hr Monitor for allergic reaction  Stop vancomycin, Cefepime and MTZ per MD Fluconazole 100 qd for thrush end 9/27 F/u renal fxn, C&S, clin status, duration    Height: 6' (182.9 cm) Weight: 120.2 kg (265 lb) IBW/kg (Calculated) : 77.6  Temp (24hrs), Avg:97.7 F (36.5 C), Min:97.4 F (36.3 C), Max:98.6 F (37 C)  Recent Labs  Lab 06/01/20 1248 06/01/20 1248 06/01/20 1951 06/01/20 1951 06/02/20 0035 06/02/20 0035 06/02/20 1607 06/03/20 0403 06/03/20 1551 06/04/20 1217 06/04/20 1356 06/05/20 0432 06/05/20 1200  WBC 9.7  --   --   --  8.8  --  5.9  --   --   --   --   --  5.3  CREATININE 0.40*   < > 0.31*   < > 0.34*   < >  --  0.33* <0.30* <0.20* <0.20* 0.32*  --   LATICACIDVEN  --   --  0.9  --  0.8  --   --   --   --   --   --   --   --    < > = values in this interval not displayed.    Estimated Creatinine Clearance: 106.8 mL/min (A) (by C-G formula based on SCr of 0.32 mg/dL (L)).    Allergies  Allergen Reactions  . Levofloxacin Rash  . Penicillins Hives and Rash    Tolerated cefepime and cephalexin    Antimicrobials this admission: Cefepime 9/13>> Vanc 9/13>> MTZ 9/13>>  Flucon 9/13>>9/27   Microbiology results: 9/13 Bcx 1/2 - proteus  9/16 Tissue: pending   Thank you for allowing pharmacy to be a part  of this patient's care.  Benetta Spar, PharmD, BCPS, BCCP Clinical Pharmacist  Please check AMION for all [facility] phone numbers After 10:00 PM, call [place] [phone]

## 2020-06-05 NOTE — Progress Notes (Signed)
Nutrition Follow-up  DOCUMENTATION CODES:   Non-severe (moderate) malnutrition in context of chronic illness, Obesity unspecified  INTERVENTION:   -ContinueOsmolite 1.5@ 58ml/hr via g-tube and increase by 10 ml every 8hours to goal rate of 82ml/hr.   25ml Prosource TF BID.   Tube feeding regimen provides2280kcal (100% of needs),134grams of protein, and 1058ml of H2O.   -Continue MVI with minerals daily  -1 packet Juven BID per tube, each packet provides 95 calories, 2.5 grams of protein (collagen), and 9.8 grams of carbohydrate (3 grams sugar); also contains 7 grams of L-arginine and L-glutamine, 300 mg vitamin C, 15 mg vitamin E, 1.2 mcg vitamin B-12, 9.5 mg zinc, 200 mg calcium, and 1.5 g  Calcium Beta-hydroxy-Beta-methylbutyrate to support wound healing  -Recommend monitoring Mg, K, and Phos daily x 3 days due to high refeeding risk  NUTRITION DIAGNOSIS:   Moderate Malnutrition related to chronic illness (transvere myelitis) as evidenced by energy intake < 75% for > or equal to 1 month, moderate fat depletion, mild fat depletion, mild muscle depletion, moderate muscle depletion.  Ongoing  GOAL:   Patient will meet greater than or equal to 90% of their needs  Progressing   MONITOR:   Diet advancement, Labs, Weight trends, TF tolerance, Skin, I & O's  REASON FOR ASSESSMENT:   Consult Enteral/tube feeding initiation and management  ASSESSMENT:   Brent [name]. is a 74 y.o. male with medical history significant of recent diagnosis of transverse myelitis status post rituximab, paraplegia secondary to transverse myelitis, HTN, HLD, IDDM, CAD, presented with trouble eating and failure to thrive.  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  Patient then underwent inpatient  rehab but his further recovery has been hampered by his nutrition status.  Looks like patient developed mild dysphagia and dryness in the mouth on last admission, patient told me that has never went away.  Since last week, his swallow condition worsened, same time, his appetite also dropped significantly.  He told me that even swallow at the point water caused "soreness on the back of the mouth".  9/14- rectal tube placed, s/p BSE-recommending full liquid diet 9/16- s/p Procedure:laparoscopic loop sigmoid colostomy, laparoscopic gastrostomy tube placement, lysis of adhesions (15 minutes), debridement of sacral decubitus ulcer (14cm x 14cm x 3cm); TF initiated 9/17- advanced to dysphagia 3 diet  Reviewed I/O's: -55 ml x 24 hours and +3.2 L since admission  UOP: 1.3 L x 24 hours  Case discussed with RN, who reports pt ate a little breakfast this morning and is tolerating TF well. Pt does not prefer hospital food- consumed a few bites of eggs this morning.   Spoke with pt, who appears physically better and more interactive today. Pt confused at times, but answers questions appropriately when redirected. Pt shares that he did not eat most of his breakfast as he did not like it. RD obtained food preferences and obtained/ entered lunch order.   Osmolite 1.5 currently infusing at 30 ml/hr. Pt denies any nausea, vomiting, or abdominal pain/ distention at this time.   Medications reviewed and include lactated ringers infusion @ 75 ml/hr.   Labs reviewed: K: 2.8 (on IV supplementation), Mg: 1.6 (on IV supplementation), Phos WDL. CBGS: 189-234 (inpatient orders for glycemic control are 0-15 units inuslin aspart TID with meals).   Diet Order:   Diet Order  DIET DYS 3 Room service appropriate? Yes; Fluid consistency: Thin  Diet effective now                 EDUCATION NEEDS:   No education needs have been identified at this time  Skin:  Skin Assessment: Skin Integrity Issues: Skin  Integrity Issues:: DTI, Unstageable DTI: lt heel Unstageable: sacrum, highly necrotic  Last BM:  06/04/20 (via rectal tube- no colostomy output yet)  Height:   Ht Readings from Last 1 Encounters:  06/01/20 6' (1.829 m)    Weight:   Wt Readings from Last 1 Encounters:  06/05/20 120.2 kg    Ideal Body Weight:  74.8 kg (adjusted for paraplegia)  BMI:  Body mass index is 35.94 kg/m.  Estimated Nutritional Needs:   Kcal:  1314-3888  Protein:  125-150 grams  Fluid:  > 2 L    Loistine Chance, RD, LDN, [place] Registered Dietitian II Certified Diabetes Care and Education Specialist Please refer to [facility] for RD and/or RD on-call/weekend/after hours pager

## 2020-06-05 NOTE — Consult Note (Addendum)
[facility] [place] Nurse ostomy consult note Orders remain in place for sacral wound care. New diverting loop colostomy in place to promote healing.  Stoma type/location: LLQ colostomy Stomal assessment/size: 1" flush stoma with retention sutures present at 3 and 9 o'clock.  Peristomal assessment: intact Treatment options for stomal/peristomal skin: barrier ring 2 piece 2 3/4" pouch Output pouch was FULL of liquid brown stool.  Ostomy pouching: 2pc. 2 3/4" pouch with barrier ring.  These are kept on unit. I AM ordering flexible convex pouches for next pouch change.  Education provided: Spouse not at bedside.  Patient states she will care for ostomy if discharged home.  I note he likely intends to discharge to SNF.   I continue to educate patient that pouch changes will likely be twice weekly and emptying when 1/3 full. Patient with paraplegia noted.  Enrolled patient in [place] program: No Will follow.  Domenic Moras MSN, RN, FNP-BC [facility]ON Wound, Ostomy, Continence Nurse Pager [phone]

## 2020-06-05 NOTE — Social Work (Signed)
Voicemail left for pt's wife Brent [name] to discuss dc plan. Anticipate SNF needed at dc. Will continue efforts to reach pt's wife to confirm dc plan.   Wandra Feinstein, MSW, LCSW [phone] (coverage)

## 2020-06-05 NOTE — Discharge Instructions (Signed)
WOUND CARE: - midline dressing to be changed twice daily - supplies: sterile saline, kerlix, scissors, ABD pads, tape  - remove dressing and all packing carefully, moistening with sterile saline as needed to avoid packing/internal dressing sticking to the wound. - clean edges of skin around the wound with water/gauze, making sure there is no tape debris or leakage left on skin that could cause skin irritation or breakdown. - dampen and clean kerlix with sterile saline and pack wound from wound base to skin level, making sure to take note of any possible areas of wound tracking, tunneling and packing appropriately. Wound can be packed loosely. Trim kerlix to size if a whole kerlix is not required. - cover wound with a dry ABD pad and secure with tape.  - write the date/time on the dry dressing/tape to better track when the last dressing change occurred. - apply any skin protectant/powder recommended by clinician to protect skin/skin folds. - change dressing as needed if leakage occurs, wound gets contaminated, or patient requests to shower. - patient may shower daily with wound open and following the shower the wound should be dried and a clean dressing placed.   Gastrostomy Tube Home Guide, Adult A gastrostomy tube, or G-tube, is a tube that is inserted through the abdomen into the stomach. The tube is used to give feedings and medicines when a person is unable to eat and drink enough on his or her own. How to care for a G-tube Supplies needed  Saline solution or clean, warm water and soap.  Cotton swab or gauze.  Precut gauze bandage (dressing) and tape, if needed. Instructions 1. Wash your hands with soap and water. 2. If there is a dressing between the person's skin and the tube, remove it. 3. Check the area where the tube enters the skin. Check for problems such as: ? Redness. ? Swelling. ? Pus-like drainage. ? Extra skin growth. 4. Moisten the cotton swab with the saline solution  or soap and water mixture. Gently clean around the insertion site. Remove any drainage or crusting. ? When the G-tube is first put in, a normal saline solution or water can be used to clean the skin. ? Mild soap and warm water can be used when the skin around the G-tube site has healed. 5. If there should be a dressing between the person's skin and the tube, apply it at this time. How to flush a G-tube Flush the G-tube regularly to keep it from clogging. Flush it before and after feedings and as often as told by the health care provider. Supplies needed  Purified or sterile water, warmed. If the person has a weak disease-fighting (immune) system, or if he or she has difficulty fighting off infections (is immunocompromised), use only sterile water. ? If you are unsure about the amount of chemical contaminants in purified or drinking water, use sterile water. ? To purify drinking water by boiling:  Boil water for at least 1 minute. Keep lid over water while it boils. Allow water to cool to room temperature before using.  60cc G-tube syringe. Instructions 1. Wash your hands with soap and water. 2. Draw up 30 mL of warm water in a syringe. 3. Connect the syringe to the tube. 4. Slowly and gently push the water into the tube. G-tube problems and solutions  If the tube comes out: ? Cover the opening with a clean dressing and tape. ? Call a health care provider right away. ? A health care provider will  need to put the tube back in within 4 hours.  If there is skin or scar tissue growing where the tube enters the skin: ? Keep the area clean and dry. ? Secure the tube with tape so that the tube does not move around too much. ? Call a health care provider.  If the tube gets clogged: ? Slowly push warm water into the tube with a large syringe. ? Do not force the fluid into the tube or push an object into the tube. ? If you are not able to unclog the tube, call a health care provider right  away. Follow these instructions at home: Feedings  Give feedings at room temperature.  Cover and place unused feedings in the refrigerator.  If feedings are continuous: ? Do not put more than 4 hours worth of feedings in the feeding bag. ? Stop the feedings when you need to give medicine or flush the tube. Be sure to restart the feedings. ? Make sure the person's head is above his or her stomach (upright position). This will prevent choking and discomfort.  Replace feeding bags and syringes as told by the health care provider.  Make sure the person is in the right position during and after feedings: ? During feedings, the person's position should be in the upright position. ? After a noncontinuous feeding (bolus feeding), have the person stay in the upright position for 1 hour. General instructions  Only use syringes made for G-tubes.  Do not pull or put tension on the tube.  Clamp the tube before removing the cap or disconnecting a syringe.  Measure the length of the G-tube every day from the insertion site to the end of the tube.  If the person's G-tube has a balloon, check the fluid in the balloon every week. The amount of fluid that should be in the balloon can be found in the manufacturer's specifications.  Make sure the person takes care of his or her oral health, such as by brushing his or her teeth.  Remove excess air from the G-tube as told by the person's health care provider. This is called "venting."  Keep the area where the tube enters the skin clean and dry.  Do not push feedings, medicines, or flushes rapidly. Contact a health care provider if:  The person with the tube has any of these problems: ? Constipation. ? Fever.  There is a large amount of fluid or mucus-like liquid leaking from the tube.  Skin or scar tissue appears to be growing where the tube enters the skin.  The length of tube from the insertion site to the G-tube gets longer. Get help  right away if:  The person with the tube has any of these problems: ? Severe abdominal pain. ? Severe tenderness. ? Severe bloating. ? Nausea. ? Vomiting. ? Trouble breathing. ? Shortness of breath.  Any of these problems happen in the area where the tube enters the skin: ? Redness, irritation, swelling, or soreness. ? Pus-like discharge. ? A bad smell.  The tube is clogged and cannot be flushed.  The tube comes out. Summary  A gastrostomy tube, or G-tube, is a tube that is inserted through the abdomen into the stomach. The tube is used to give feedings and medicines when a person is unable to eat and drink enough on his or her own.  Check and clean the insertion site daily as told by the person's health care provider.  Flush the G-tube regularly to keep  it from clogging. Flush it before and after feedings and as often as told by the person's health care provider.  Keep the area where the tube enters the skin clean and dry. This information is not intended to replace advice given to you by your health care provider. Make sure you discuss any questions you have with your health care provider. Document Revised: 08/18/2017 Document Reviewed: 10/31/2016 Elsevier Patient Education  2020 [place], Adult  Colostomy surgery is done to create an opening in the front of the abdomen for stool (feces) to leave the body through an ostomy (stoma). Part of the large intestine is attached to the stoma. A bag, also called a pouch, is fitted over the stoma. Stool and gas will collect in the bag. After surgery, you will need to empty and change your colostomy bag as needed. You will also need to care for your stoma. How to care for the stoma Your stoma should look pink, red, and moist, like the inside of your cheek. Soon after surgery, the stoma may be swollen, but this swelling will go away within 6 weeks. To care for the stoma:  Keep the skin around the stoma clean  and dry.  Use a clean, soft washcloth to gently wash the stoma and the skin around it. Clean using a circular motion, and wipe away from the stoma opening, not toward it. ? Use warm water and only use cleansers recommended by your health care provider. ? Rinse the stoma area with plain water. ? Dry the area around the stoma well.  Use stoma powder or ointment on your skin only as told by your health care provider. Do not use any other powders, gels, wipes, or creams on the skin around the stoma.  Check the stoma area every day for signs of infection. Check for: ? New or worsening redness, swelling, or pain. ? New or increased fluid or blood. ? Pus or warmth.  Measure the stoma opening regularly and record the size. Watch for changes. (It is normal for the stoma to get smaller as swelling goes away.) Share this information with your health care provider. How to empty the colostomy bag  Empty your bag at bedtime and whenever it is one-third to one-half full. Do not let the bag get more than half-full with stool or gas. The bag could leak if it gets too full. Some colostomy bags have a built-in gas release valve that releases gas often throughout the day. Follow these basic steps: 1. Wash your hands with soap and water. 2. Sit far back on the toilet seat. 3. Put several pieces of toilet paper into the toilet water. This will prevent splashing as you empty stool into the toilet. 4. Remove the clip or the hook-and-loop fastener from the tail end of the bag. 5. Unroll the tail, then empty the stool into the toilet. 6. Clean the tail with toilet paper or a moist towelette. 7. Reroll the tail, and close it with the clip or the hook-and-loop fastener. 8. Wash your hands again. How to change the colostomy bag Change your bag every 3-4 days or as often as told by your health care provider. Also change the bag if it is leaking or separating from the skin, or if your skin around the stoma looks or  feels irritated. Irritated skin may be a sign that the bag is leaking. Always have colostomy supplies with you, and follow these basic steps: 1. Wash your  hands with soap and water. Have paper towels or tissues nearby to clean any discharge. 2. Remove the old bag and skin barrier. Use your fingers or a warm cloth to gently push the skin away from the barrier. 3. Clean the stoma area with water or with mild soap and water, as directed. Use water to rinse away any soap. 4. Dry the skin. You may use the cool setting on a hair dryer to do this. 5. Use a tracing pattern (template) to cut the skin barrier to the size needed. 6. If you are using a two-piece bag, attach the bag and the skin barrier to each other. Add the barrier ring, if you use one. 7. If directed, apply stoma powder or skin barrier gel to the skin. 8. Warm the skin barrier with your hands, or blow with a hair dryer for 5-10 seconds. 9. Remove the paper from the adhesive strip of the skin barrier. 10. Press the adhesive strip onto the skin around the stoma. 11. Gently rub the skin barrier onto the skin. This creates heat that helps the barrier to stick. 12. Apply stoma tape to the edges of the skin barrier, if desired. 60. Wash your hands again. General recommendations  Avoid wearing tight clothes or having anything press directly on your stoma or bag. Change your clothing whenever it is soiled or damp.  You may shower or bathe with the bag on or off. Do not use harsh or oily soaps or lotions. Dry the skin and bag after bathing.  Store all supplies in a cool, dry place. Do not leave supplies in extreme heat because some parts can melt or not stick as well.  Whenever you leave home, take extra clothing and an extra skin barrier and bag with you.  If your bag gets wet, you can dry it with a hair dryer on the cool setting.  To prevent odor, you may put drops of ostomy deodorizer in the bag.  If recommended by your health care  provider, put ostomy lubricant inside the bag. This helps stool to slide out of the bag more easily and completely. Contact a health care provider if:  You have new or worsening redness, swelling, or pain around your stoma.  You have new or increased fluid or blood coming from your stoma.  Your stoma feels warm to the touch.  You have pus coming from your stoma.  Your stoma extends in or out farther than normal.  You need to change your bag every day.  You have a fever. Get help right away if:  Your stool is bloody.  You have nausea or you vomit.  You have trouble breathing. Summary  Measure your stoma opening regularly and record the size. Watch for changes.  Empty your bag at bedtime and whenever it is one-third to one-half full. Do not let the bag get more than half-full with stool or gas.  Change your bag every 3-4 days or as often as told by your health care provider.  Whenever you leave home, take extra clothing and an extra skin barrier and bag with you. This information is not intended to replace advice given to you by your health care provider. Make sure you discuss any questions you have with your health care provider. Document Revised: 12/26/2018 Document Reviewed: 03/01/2017 Elsevier Patient Education  [place].
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
## 2020-06-05 NOTE — Consult Note (Addendum)
[facility] for Infectious Disease    Date of Admission:  06/01/2020   Total days of antibiotics 5        Day 4 cefazolin              Reason for Consult: Sacral wound and lumbar infections complicated by Proteus bacteremia    Referring Provider: Cordelia Poche  Assessment: He has developed a sacral wound infection, lumbar infection, right psoas abscess and Proteus bacteremia.  I favor treating him with IV ampicillin sulbactam as he probably has a mixed aerobic anaerobic abscess in his left buttock.  He will need a prolonged course of therapy this will be very difficult to cure given his paraplegia, weight loss and malnutrition.  Plan: 1. Change cefepime to ampicillin sulbactam 2. Consider consolidating IV lines to a PICC 3. Please call Dr. Tommy Medal [phone]) for any infectious disease questions this weekend  Principal Problem:   Abscess, gluteal, left Active Problems:   Sacral ulcer, with necrosis of muscle (HCC)   Lumbar discitis   Psoas abscess, right (HCC)   Gram-negative bacteremia   Hyperlipidemia   Essential hypertension   Transverse myelitis (HCC)   FTT (failure to thrive) in adult   Palliative care by specialist   Goals of care, counseling/discussion   Malnutrition of moderate degree   Scheduled Meds: . antiseptic oral rinse  10 mL Mouth Rinse BID  . vitamin C  500 mg Oral Daily  . aspirin EC  81 mg Oral QHS  . atorvastatin  20 mg Oral Daily  . Chlorhexidine Gluconate Cloth  6 each Topical Daily  . Chlorhexidine Gluconate Cloth  6 each Topical Q0600  . cholestyramine  4 g Oral Daily  . diclofenac Sodium  2 g Topical BID  . dronabinol  2.5 mg Oral BID AC  . enoxaparin (LOVENOX) injection  40 mg Subcutaneous Q24H  . feeding supplement (PROSource TF)  90 mL Per Tube BID  . fluconazole  100 mg Oral Daily  . fluticasone  2 spray Each Nare BID  . gabapentin  100 mg Oral Daily  . gabapentin  300 mg Oral QHS  . icosapent Ethyl  2 g Oral BID  .  [START ON 06/08/2020] influenza vaccine adjuvanted  0.5 mL Intramuscular Once  . insulin aspart  0-15 Units Subcutaneous Q4H  . lidocaine  1 patch Transdermal Q24H  . multivitamin with minerals  1 tablet Oral Daily  . mupirocin ointment  1 application Nasal BID  . Muscle Rub  1 application Topical BID  . nutrition supplement (JUVEN)  1 packet Per Tube BID BM  . ondansetron  4 mg Oral BID  . oxybutynin  5 mg Oral Daily  . pantoprazole  40 mg Oral Daily  . saccharomyces boulardii  250 mg Oral BID  . sertraline  150 mg Oral QHS  . tamsulosin  0.4 mg Oral QPC supper  . vitamin B-12  500 mcg Oral Daily  . zinc sulfate  220 mg Oral Daily   Continuous Infusions: . feeding supplement (OSMOLITE 1.5 CAL) 1,000 mL (06/05/20 1244)  . lactated ringers 75 mL/hr at 06/05/20 0245  . magnesium sulfate bolus IVPB 4 g (06/05/20 1345)   PRN Meds:.acetaminophen, albuterol, bisacodyl, lidocaine, lip balm, sodium chloride flush, traMADol  HPI: Brent [name]. is a 74 y.o. male who developed transverse myelitis and lower extremity paraplegia in July.  He had a lengthy hospital stay before being transferred to  a skilled nursing facility.  He has been suffering from very dry mouth and anorexia.  He has had dramatic weight loss and has developed a large stage IV sacral pressure ulcer.  He was admitted recently because of failure to thrive.  He has been afebrile.  RI revealed a left gluteal abscess adjacent to his wound and L4-5 osteomyelitis with a right psoas abscess.  Admission blood cultures have grown Proteus.  A diverting colostomy was performed.  He says that he is feeling a little bit better now.   Review of Systems: Review of Systems  Constitutional: Positive for malaise/fatigue and weight loss. Negative for chills, diaphoresis and fever.  Gastrointestinal: Negative for abdominal pain, nausea and vomiting.  Musculoskeletal: Negative for back pain.    Past Medical History:  Diagnosis Date  .  Allergy   . CAD (coronary artery disease)    a. angioplasty of his RCA in 1990. b. bare metal stent placed in the RCA in 2000 followed by rotational atherectomy shortly after for stent restenosis. c. last cath was in 2012 showing stable moderate diffuse CAD. (70% mid LAD, 80% diagonal, 70% Ramus, 40% mid to distal RCA stent restenosis). d. Low risk nuc in 2015.  . Diabetes mellitus   . Diverticulosis   . Elevated CK   . Erectile dysfunction   . Hemorrhoids   . HTN (hypertension)   . Hyperlipidemia   . Hypertriglyceridemia   . Malignant melanoma of left side of neck ([place]) 10/25/2018  . Myocardial infarction ([place])   . Obesity   . OSA (obstructive sleep apnea)   . Persistent disorder of initiating or maintaining sleep   . Personal history of colonic polyps 02/05/2003    Social History   Tobacco Use  . Smoking status: Former Smoker    Packs/day: 1.50    Years: 30.00    Pack years: 45.00    Types: Cigarettes    Quit date: 09/19/1980    Years since quitting: 39.7  . Smokeless tobacco: Never Used  Vaping Use  . Vaping Use: Never used  Substance Use Topics  . Alcohol use: Not Currently    Alcohol/week: 7.0 - 14.0 standard drinks    Types: 7 - 14 Shots of liquor per week  . Drug use: No    Family History  Problem Relation Age of Onset  . Liver cancer Father   . Lung cancer Father   . Heart disease Father   . Heart disease Sister   . Lung cancer Mother   . COPD Mother   . Obesity Mother   . Colon cancer Neg Hx   . Stomach cancer Neg Hx   . Pancreatic cancer Neg Hx    Allergies  Allergen Reactions  . Levofloxacin Rash  . Penicillins Hives and Rash    Tolerated cefepime and cephalexin    OBJECTIVE: Blood pressure (!) 120/54, pulse 64, temperature (!) 97.5 F (36.4 C), temperature source Oral, resp. rate 16, height 6' (1.829 m), weight 120.2 kg, SpO2 96 %.  Physical Exam Constitutional:      Comments: He is resting quietly in bed.  He is pale and weak.    Cardiovascular:     Rate and Rhythm: Normal rate and regular rhythm.     Heart sounds: No murmur heard.   Pulmonary:     Effort: Pulmonary effort is normal.     Breath sounds: Normal breath sounds.  Abdominal:     Palpations: Abdomen is soft.     Lab  Results Lab Results  Component Value Date   WBC 5.3 06/05/2020   HGB 8.3 (L) 06/05/2020   HCT 26.8 (L) 06/05/2020   MCV 82.2 06/05/2020   PLT 297 06/05/2020    Lab Results  Component Value Date   CREATININE 0.32 (L) 06/05/2020   BUN 7 (L) 06/05/2020   NA 137 06/05/2020   K 2.8 (L) 06/05/2020   CL 105 06/05/2020   CO2 21 (L) 06/05/2020    Lab Results  Component Value Date   ALT 68 (H) 06/01/2020   AST 62 (H) 06/01/2020   ALKPHOS 134 (H) 06/01/2020   BILITOT 0.7 06/01/2020     Microbiology: Recent Results (from the past 240 hour(s))  SARS Coronavirus 2 by RT PCR (hospital order, performed in [place] hospital lab) Nasopharyngeal Nasopharyngeal Swab     Status: None   Collection Time: 06/01/20 12:24 PM   Specimen: Nasopharyngeal Swab  Result Value Ref Range Status   SARS Coronavirus 2 NEGATIVE NEGATIVE Final    Comment: (NOTE) SARS-CoV-2 target nucleic acids are NOT DETECTED.  The SARS-CoV-2 RNA is generally detectable in upper and lower respiratory specimens during the acute phase of infection. The lowest concentration of SARS-CoV-2 viral copies this assay can detect is 250 copies / mL. A negative result does not preclude SARS-CoV-2 infection and should not be used as the sole basis for treatment or other patient management decisions.  A negative result may occur with improper specimen collection / handling, submission of specimen other than nasopharyngeal swab, presence of viral mutation(s) within the areas targeted by this assay, and inadequate number of viral copies (<250 copies / mL). A negative result must be combined with clinical observations, patient history, and epidemiological information.  Fact  Sheet for Patients:   StrictlyIdeas.no  Fact Sheet for Healthcare Providers: BankingDealers.co.za  This test is not yet approved or  cleared by the [place] FDA and has been authorized for detection and/or diagnosis of SARS-CoV-2 by FDA under an Emergency Use Authorization (EUA).  This EUA will remain in effect (meaning this test can be used) for the duration of the COVID-19 declaration under Section 564(b)(1) of the Act, 21 U.S.C. section 360bbb-3(b)(1), unless the authorization is terminated or revoked sooner.  Performed at [facility], [place] [address], [place], [place] [address]   Blood culture (routine x 2)     Status: Abnormal   Collection Time: 06/01/20 12:30 PM   Specimen: BLOOD  Result Value Ref Range Status   Specimen Description BLOOD SITE NOT SPECIFIED  Final   Special Requests   Final    BOTTLES DRAWN AEROBIC AND ANAEROBIC Blood Culture adequate volume   Culture  Setup Time   Final    AEROBIC BOTTLE ONLY GRAM NEGATIVE RODS CRITICAL VALUE NOTED.  VALUE IS CONSISTENT WITH PREVIOUSLY REPORTED AND CALLED VALUE.    Culture (A)  Final    PROTEUS MIRABILIS SUSCEPTIBILITIES PERFORMED ON PREVIOUS CULTURE WITHIN THE LAST 5 DAYS. Performed at [facility], [place] [address] [place], [place] [address]    Report Status 06/05/2020 FINAL  Final  Blood culture (routine x 2)     Status: Abnormal   Collection Time: 06/01/20 12:35 PM   Specimen: BLOOD  Result Value Ref Range Status   Specimen Description BLOOD SITE NOT SPECIFIED  Final   Special Requests   Final    BOTTLES DRAWN AEROBIC AND ANAEROBIC Blood Culture adequate volume   Culture  Setup Time   Final  GRAM NEGATIVE RODS AEROBIC BOTTLE ONLY CRITICAL RESULT CALLED TO, READ BACK BY AND VERIFIED WITH: PHARMD E.FULOIPEN AT 7867 ON 06/02/2020 BY T.SAAD Performed at [facility], [place] [address], [place], [place] [address]    Culture PROTEUS MIRABILIS (A)   Final   Report Status 06/04/2020 FINAL  Final   Organism ID, Bacteria PROTEUS MIRABILIS  Final      Susceptibility   Proteus mirabilis - MIC*    AMPICILLIN <=2 SENSITIVE Sensitive     CEFAZOLIN <=4 SENSITIVE Sensitive     CEFEPIME <=0.12 SENSITIVE Sensitive     CEFTAZIDIME <=1 SENSITIVE Sensitive     CEFTRIAXONE <=0.25 SENSITIVE Sensitive     CIPROFLOXACIN <=0.25 SENSITIVE Sensitive     GENTAMICIN <=1 SENSITIVE Sensitive     IMIPENEM 4 SENSITIVE Sensitive     TRIMETH/SULFA >=320 RESISTANT Resistant     AMPICILLIN/SULBACTAM <=2 SENSITIVE Sensitive     PIP/TAZO <=4 SENSITIVE Sensitive     * PROTEUS MIRABILIS  Blood Culture ID Panel (Reflexed)     Status: Abnormal   Collection Time: 06/01/20 12:35 PM  Result Value Ref Range Status   Enterococcus faecalis NOT DETECTED NOT DETECTED Final   Enterococcus Faecium NOT DETECTED NOT DETECTED Final   Listeria monocytogenes NOT DETECTED NOT DETECTED Final   Staphylococcus species NOT DETECTED NOT DETECTED Final   Staphylococcus aureus (BCID) NOT DETECTED NOT DETECTED Final   Staphylococcus epidermidis NOT DETECTED NOT DETECTED Final   Staphylococcus lugdunensis NOT DETECTED NOT DETECTED Final   Streptococcus species NOT DETECTED NOT DETECTED Final   Streptococcus agalactiae NOT DETECTED NOT DETECTED Final   Streptococcus pneumoniae NOT DETECTED NOT DETECTED Final   Streptococcus pyogenes NOT DETECTED NOT DETECTED Final   A.calcoaceticus-baumannii NOT DETECTED NOT DETECTED Final   Bacteroides fragilis NOT DETECTED NOT DETECTED Final   Enterobacterales DETECTED (A) NOT DETECTED Final    Comment: Enterobacterales represent a large order of gram negative bacteria, not a single organism. CRITICAL RESULT CALLED TO, READ BACK BY AND VERIFIED WITH: PHARMD E.FULOIPEN AT 1205 ON 06/02/2020 BY T.SAAD    Enterobacter cloacae complex NOT DETECTED NOT DETECTED Final   Escherichia coli NOT DETECTED NOT DETECTED Final   Klebsiella aerogenes NOT DETECTED  NOT DETECTED Final   Klebsiella oxytoca NOT DETECTED NOT DETECTED Final   Klebsiella pneumoniae NOT DETECTED NOT DETECTED Final   Proteus species DETECTED (A) NOT DETECTED Final    Comment: CRITICAL RESULT CALLED TO, READ BACK BY AND VERIFIED WITH: PHARMD E.FULOIPEN AT 1205 ON 06/02/2020 BY T.SAAD    Salmonella species NOT DETECTED NOT DETECTED Final   Serratia marcescens NOT DETECTED NOT DETECTED Final   Haemophilus influenzae NOT DETECTED NOT DETECTED Final   Neisseria meningitidis NOT DETECTED NOT DETECTED Final   Pseudomonas aeruginosa NOT DETECTED NOT DETECTED Final   Stenotrophomonas maltophilia NOT DETECTED NOT DETECTED Final   Candida albicans NOT DETECTED NOT DETECTED Final   Candida auris NOT DETECTED NOT DETECTED Final   Candida glabrata NOT DETECTED NOT DETECTED Final   Candida krusei NOT DETECTED NOT DETECTED Final   Candida parapsilosis NOT DETECTED NOT DETECTED Final   Candida tropicalis NOT DETECTED NOT DETECTED Final   Cryptococcus neoformans/gattii NOT DETECTED NOT DETECTED Final   CTX-M ESBL NOT DETECTED NOT DETECTED Final   Carbapenem resistance IMP NOT DETECTED NOT DETECTED Final   Carbapenem resistance KPC NOT DETECTED NOT DETECTED Final   Carbapenem resistance NDM NOT DETECTED NOT DETECTED Final   Carbapenem resist OXA 48 LIKE NOT DETECTED  NOT DETECTED Final   Carbapenem resistance VIM NOT DETECTED NOT DETECTED Final    Comment: Performed at [facility], [place] [address], [place], [place] [address]  Surgical pcr screen     Status: Abnormal   Collection Time: 06/04/20  9:13 AM   Specimen: Nasal Mucosa; Nasal Swab  Result Value Ref Range Status   MRSA, PCR NEGATIVE NEGATIVE Final   Staphylococcus aureus POSITIVE (A) NEGATIVE Final    Comment: (NOTE) The Xpert SA Assay (FDA approved for NASAL specimens in patients 67 years of age and older), is one component of a comprehensive surveillance program. It is not intended to diagnose infection nor to guide or  monitor treatment. Performed at [facility], [place] [address], [place], [place] [address]   Aerobic/Anaerobic Culture (surgical/deep wound)     Status: None (Preliminary result)   Collection Time: 06/04/20  1:43 PM   Specimen: Soft Tissue, Other  Result Value Ref Range Status   Specimen Description WOUND  Final   Special Requests SACRAL  Final   Gram Stain NO WBC SEEN NO ORGANISMS SEEN   Final   Culture   Final    CULTURE REINCUBATED FOR BETTER GROWTH Performed at [facility], [address] [place], [place] [address]    Report Status PENDING  Incomplete    Michel Bickers, MD [facility] [phone] pager   [phone] cell 06/05/2020, 2:56 PM

## 2020-06-05 NOTE — Progress Notes (Signed)
PROGRESS NOTE    Brent [name].  DUK:[id number] DOB: 1946/06/22 DOA: 06/01/2020 PCP: [name], [name], MD   Brief Narrative: Brent [name]. is a 74 y.o. male with a history of transverse myelitis status post rituximab, paraplegia secondary to transverse myelitis, hypertension, hyperlipidemia, insulin-dependent diabetes mellitus, CAD.  Patient presented secondary to trouble eating and failure to thrive and was found to have a left gluteal abscess in addition to discitis/osteomyelitis on imaging.  Patient started empirically on antibiotics for management in addition to general surgery consult for debridement of sacral wound/abscess.  Patient started empirically on IV antibiotics.   Assessment & Plan:   Principal Problem:   Abscess, gluteal, left Active Problems:   Hyperlipidemia   Essential hypertension   Transverse myelitis (HCC)   Sacral ulcer, with necrosis of muscle (HCC)   FTT (failure to thrive) in adult   Palliative care by specialist   Goals of care, counseling/discussion   Discitis of thoracic region   Osteomyelitis ([place])   Malnutrition of moderate degree   Left gluteal abscess/necrotic sacral wound L4-L5 discitis/osteomyelitis Proteus mirabilis bacteremia General surgery consulted for debridement.  Blood cultures significant for proteus mirabilis in 1 out of 4 samples.  Unlikely that this is a contaminant in setting of gluteal abscess and discitis. Patient is s/p diverting loop colostomy and debridement on 9/16. -Discontinue Vancomycin, Flagyl, Cefepime -Cefazolin IV -Infectious disease consult  -General surgery recommendations: tube feeds, dysphagia 3 diet, BID dressing changes, D/C rectal tube when output slowed  History of transverse myelitis with paraplegia Plan for SNF -PT/OT  Dysphagia Esophagram performed on 9/14 without evidence of esophageal dysmotility. Likely oral phase disability. Speech therapy consulted. Complicated by Xerostomia.  GI consulted who recommends ENT consult. -SLP recommendations pending  Failure to thrive In setting of above. Plan for G-tube placement per general surgery. -General surgery recommendations for G-tube placement -Daily BMP, Magnesium, Phosphorus  Abdominal nodule Unknown etiology. Patient states he has had it for some time. Otherwise unremarkable.  Hypokalemia Supplementation. Worse in setting of hypomagnesemia. Also will be complicated by starting tube feeds -BMP as above  Hypomagnesemia -IV magnesium repletion  Diabetes mellitus, type II Hemoglobin A1c of 6.6%. -Continue sliding scale insulin  Hyperlipidemia -Continue atorvastatin  Essential hypertension On lisinopril as an outpatient which was held on admission.  Blood pressure has been mostly controlled while inpatient.  Pressure injury Sacral, left anterior ankle, right anterior ankle, right heel, left heel.  All present on admission    DVT prophylaxis: Lovenox Code Status:   Code Status: Full Code Family Communication: None at bedside Disposition Plan: Discharge likely to SNF in several days pending stability on tube feeds, general surgery recommendations and ID recommendations for outpatient antibiotic regimen   Consultants:   General surgery  Gastroenterology  Infectious disease  Procedures:   Debridement/diverting loop colostomy/Gastrostomy tube placement  Antimicrobials:  Vancomycin  Cefepime  Flagyl  Cefazolin  Subjective: No concerns today.  Objective: Vitals:   06/04/20 1500 06/04/20 1530 06/04/20 2049 06/05/20 0403  BP: (!) 118/47 (!) 129/42 124/62 (!) 120/54  Pulse: 64 67 67 64  Resp: 18 17 16 16   Temp:  98.6 F (37 C) 97.6 F (36.4 C) (!) 97.5 F (36.4 C)  TempSrc:  Axillary Oral Oral  SpO2: 97% 97% 97% 96%  Weight:    120.2 kg  Height:        Intake/Output Summary (Last 24 hours) at 06/05/2020 1342 Last data filed at 06/05/2020 1229 Gross per 24  hour  Intake 1442 ml    Output 1000 ml  Net 442 ml   Filed Weights   06/01/20 1400 06/05/20 0403  Weight: 108 kg 120.2 kg    Examination:  General exam: Appears calm and comfortable Respiratory system: Clear to auscultation. Respiratory effort normal. Cardiovascular system: S1 & S2 heard, RRR. Gastrointestinal system: Abdomen is nondistended, soft and nontender. Ostomy bag appears to be newly replaced. No significant erythema noted. Decreased bowel sounds heard. Central nervous system: Alert and oriented. No focal neurological deficits. Musculoskeletal: No calf tenderness Skin: No cyanosis. No rashes Psychiatry: Judgement and insight appear normal. Mood & affect appropriate.      Data Reviewed: I have personally reviewed following labs and imaging studies  CBC Lab Results  Component Value Date   WBC 5.3 06/05/2020   RBC 3.26 (L) 06/05/2020   HGB 8.3 (L) 06/05/2020   HCT 26.8 (L) 06/05/2020   MCV 82.2 06/05/2020   MCH 25.5 (L) 06/05/2020   PLT 297 06/05/2020   MCHC 31.0 06/05/2020   RDW 14.0 06/05/2020   LYMPHSABS 0.6 (L) 06/02/2020   MONOABS 0.4 06/02/2020   EOSABS 0.0 06/02/2020   BASOSABS 0.0 66/44/0347     Last metabolic panel Lab Results  Component Value Date   NA 137 06/05/2020   K 2.8 (L) 06/05/2020   CL 105 06/05/2020   CO2 21 (L) 06/05/2020   BUN 7 (L) 06/05/2020   CREATININE 0.32 (L) 06/05/2020   GLUCOSE 167 (H) 06/05/2020   GFRNONAA >60 06/05/2020   GFRAA >60 06/05/2020   CALCIUM 8.8 (L) 06/05/2020   PHOS 2.8 06/05/2020   PROT 5.3 (L) 06/01/2020   ALBUMIN 1.5 (L) 06/01/2020   LABGLOB 2.0 04/11/2019   AGRATIO 2.3 (H) 04/11/2019   BILITOT 0.7 06/01/2020   ALKPHOS 134 (H) 06/01/2020   AST 62 (H) 06/01/2020   ALT 68 (H) 06/01/2020   ANIONGAP 11 06/05/2020    CBG (last 3)  Recent Labs    06/05/20 0402 06/05/20 0749 06/05/20 1117  GLUCAP 153* 189* 234*     GFR: Estimated Creatinine Clearance: 106.8 mL/min (A) (by C-G formula based on SCr of 0.32 mg/dL  (L)).  Coagulation Profile: No results for input(s): INR, PROTIME in the last 168 hours.  Recent Results (from the past 240 hour(s))  SARS Coronavirus 2 by RT PCR (hospital order, performed in [facility] hospital lab) Nasopharyngeal Nasopharyngeal Swab     Status: None   Collection Time: 06/01/20 12:24 PM   Specimen: Nasopharyngeal Swab  Result Value Ref Range Status   SARS Coronavirus 2 NEGATIVE NEGATIVE Final    Comment: (NOTE) SARS-CoV-2 target nucleic acids are NOT DETECTED.  The SARS-CoV-2 RNA is generally detectable in upper and lower respiratory specimens during the acute phase of infection. The lowest concentration of SARS-CoV-2 viral copies this assay can detect is 250 copies / mL. A negative result does not preclude SARS-CoV-2 infection and should not be used as the sole basis for treatment or other patient management decisions.  A negative result may occur with improper specimen collection / handling, submission of specimen other than nasopharyngeal swab, presence of viral mutation(s) within the areas targeted by this assay, and inadequate number of viral copies (<250 copies / mL). A negative result must be combined with clinical observations, patient history, and epidemiological information.  Fact Sheet for Patients:   StrictlyIdeas.no  Fact Sheet for Healthcare Providers: BankingDealers.co.za  This test is not yet approved or  cleared by the [place] FDA  and has been authorized for detection and/or diagnosis of SARS-CoV-2 by FDA under an Emergency Use Authorization (EUA).  This EUA will remain in effect (meaning this test can be used) for the duration of the COVID-19 declaration under Section 564(b)(1) of the Act, 21 U.S.C. section 360bbb-3(b)(1), unless the authorization is terminated or revoked sooner.  Performed at [facility], [place] [address], [place], [place] [address]   Blood culture (routine x 2)      Status: Abnormal   Collection Time: 06/01/20 12:30 PM   Specimen: BLOOD  Result Value Ref Range Status   Specimen Description BLOOD SITE NOT SPECIFIED  Final   Special Requests   Final    BOTTLES DRAWN AEROBIC AND ANAEROBIC Blood Culture adequate volume   Culture  Setup Time   Final    AEROBIC BOTTLE ONLY GRAM NEGATIVE RODS CRITICAL VALUE NOTED.  VALUE IS CONSISTENT WITH PREVIOUSLY REPORTED AND CALLED VALUE.    Culture (A)  Final    PROTEUS MIRABILIS SUSCEPTIBILITIES PERFORMED ON PREVIOUS CULTURE WITHIN THE LAST 5 DAYS. Performed at [facility], [place] [address] [place], [place] [address]    Report Status 06/05/2020 FINAL  Final  Blood culture (routine x 2)     Status: Abnormal   Collection Time: 06/01/20 12:35 PM   Specimen: BLOOD  Result Value Ref Range Status   Specimen Description BLOOD SITE NOT SPECIFIED  Final   Special Requests   Final    BOTTLES DRAWN AEROBIC AND ANAEROBIC Blood Culture adequate volume   Culture  Setup Time   Final    GRAM NEGATIVE RODS AEROBIC BOTTLE ONLY CRITICAL RESULT CALLED TO, READ BACK BY AND VERIFIED WITH: PHARMD E.FULOIPEN AT 2423 ON 06/02/2020 BY T.SAAD Performed at [facility], [place] [address] [place], [place] [address]    Culture PROTEUS MIRABILIS (A)  Final   Report Status 06/04/2020 FINAL  Final   Organism ID, Bacteria PROTEUS MIRABILIS  Final      Susceptibility   Proteus mirabilis - MIC*    AMPICILLIN <=2 SENSITIVE Sensitive     CEFAZOLIN <=4 SENSITIVE Sensitive     CEFEPIME <=0.12 SENSITIVE Sensitive     CEFTAZIDIME <=1 SENSITIVE Sensitive     CEFTRIAXONE <=0.25 SENSITIVE Sensitive     CIPROFLOXACIN <=0.25 SENSITIVE Sensitive     GENTAMICIN <=1 SENSITIVE Sensitive     IMIPENEM 4 SENSITIVE Sensitive     TRIMETH/SULFA >=320 RESISTANT Resistant     AMPICILLIN/SULBACTAM <=2 SENSITIVE Sensitive     PIP/TAZO <=4 SENSITIVE Sensitive     * PROTEUS MIRABILIS  Blood Culture ID Panel (Reflexed)     Status: Abnormal    Collection Time: 06/01/20 12:35 PM  Result Value Ref Range Status   Enterococcus faecalis NOT DETECTED NOT DETECTED Final   Enterococcus Faecium NOT DETECTED NOT DETECTED Final   Listeria monocytogenes NOT DETECTED NOT DETECTED Final   Staphylococcus species NOT DETECTED NOT DETECTED Final   Staphylococcus aureus (BCID) NOT DETECTED NOT DETECTED Final   Staphylococcus epidermidis NOT DETECTED NOT DETECTED Final   Staphylococcus lugdunensis NOT DETECTED NOT DETECTED Final   Streptococcus species NOT DETECTED NOT DETECTED Final   Streptococcus agalactiae NOT DETECTED NOT DETECTED Final   Streptococcus pneumoniae NOT DETECTED NOT DETECTED Final   Streptococcus pyogenes NOT DETECTED NOT DETECTED Final   A.calcoaceticus-baumannii NOT DETECTED NOT DETECTED Final   Bacteroides fragilis NOT DETECTED NOT DETECTED Final   Enterobacterales DETECTED (A) NOT DETECTED Final    Comment: Enterobacterales  represent a large order of gram negative bacteria, not a single organism. CRITICAL RESULT CALLED TO, READ BACK BY AND VERIFIED WITH: PHARMD E.FULOIPEN AT 1205 ON 06/02/2020 BY T.SAAD    Enterobacter cloacae complex NOT DETECTED NOT DETECTED Final   Escherichia coli NOT DETECTED NOT DETECTED Final   Klebsiella aerogenes NOT DETECTED NOT DETECTED Final   Klebsiella oxytoca NOT DETECTED NOT DETECTED Final   Klebsiella pneumoniae NOT DETECTED NOT DETECTED Final   Proteus species DETECTED (A) NOT DETECTED Final    Comment: CRITICAL RESULT CALLED TO, READ BACK BY AND VERIFIED WITH: PHARMD E.FULOIPEN AT 1205 ON 06/02/2020 BY T.SAAD    Salmonella species NOT DETECTED NOT DETECTED Final   Serratia marcescens NOT DETECTED NOT DETECTED Final   Haemophilus influenzae NOT DETECTED NOT DETECTED Final   Neisseria meningitidis NOT DETECTED NOT DETECTED Final   Pseudomonas aeruginosa NOT DETECTED NOT DETECTED Final   Stenotrophomonas maltophilia NOT DETECTED NOT DETECTED Final   Candida albicans NOT DETECTED NOT  DETECTED Final   Candida auris NOT DETECTED NOT DETECTED Final   Candida glabrata NOT DETECTED NOT DETECTED Final   Candida krusei NOT DETECTED NOT DETECTED Final   Candida parapsilosis NOT DETECTED NOT DETECTED Final   Candida tropicalis NOT DETECTED NOT DETECTED Final   Cryptococcus neoformans/gattii NOT DETECTED NOT DETECTED Final   CTX-M ESBL NOT DETECTED NOT DETECTED Final   Carbapenem resistance IMP NOT DETECTED NOT DETECTED Final   Carbapenem resistance KPC NOT DETECTED NOT DETECTED Final   Carbapenem resistance NDM NOT DETECTED NOT DETECTED Final   Carbapenem resist OXA 48 LIKE NOT DETECTED NOT DETECTED Final   Carbapenem resistance VIM NOT DETECTED NOT DETECTED Final    Comment: Performed at [facility], [address] [place], [place] [address]  Surgical pcr screen     Status: Abnormal   Collection Time: 06/04/20  9:13 AM   Specimen: Nasal Mucosa; Nasal Swab  Result Value Ref Range Status   MRSA, PCR NEGATIVE NEGATIVE Final   Staphylococcus aureus POSITIVE (A) NEGATIVE Final    Comment: (NOTE) The Xpert SA Assay (FDA approved for NASAL specimens in patients 44 years of age and older), is one component of a comprehensive surveillance program. It is not intended to diagnose infection nor to guide or monitor treatment. Performed at [facility], [place] [address] [place], [place] [address]   Aerobic/Anaerobic Culture (surgical/deep wound)     Status: None (Preliminary result)   Collection Time: 06/04/20  1:43 PM   Specimen: Soft Tissue, Other  Result Value Ref Range Status   Specimen Description WOUND  Final   Special Requests SACRAL  Final   Gram Stain NO WBC SEEN NO ORGANISMS SEEN   Final   Culture   Final    CULTURE REINCUBATED FOR BETTER GROWTH Performed at [facility], [address] [place], [place] [address]    Report Status PENDING  Incomplete        Radiology Studies: DG CHEST PORT 1 VIEW  Result Date: 06/04/2020 CLINICAL DATA:   PICC line placement EXAM: PORTABLE CHEST 1 VIEW COMPARISON:  None. FINDINGS: The heart size and mediastinal contours are within normal limits. A left-sided central venous catheter seen at the SVC/brachiocephalic junction. Aortic knob calcifications are seen. Both lungs are clear. The visualized skeletal structures are unremarkable. IMPRESSION: Left-sided central venous catheter is seen with the tip in the SVC brachiocephalic junction. Electronically Signed   By: Prudencio Pair M.D.   On: 06/04/2020  16:27        Scheduled Meds:  antiseptic oral rinse  10 mL Mouth Rinse BID   vitamin C  500 mg Oral Daily   aspirin EC  81 mg Oral QHS   atorvastatin  20 mg Oral Daily   Chlorhexidine Gluconate Cloth  6 each Topical Daily   Chlorhexidine Gluconate Cloth  6 each Topical Q0600   cholestyramine  4 g Oral Daily   diclofenac Sodium  2 g Topical BID   dronabinol  2.5 mg Oral BID AC   enoxaparin (LOVENOX) injection  40 mg Subcutaneous Q24H   feeding supplement (PROSource TF)  90 mL Per Tube BID   fluconazole  100 mg Oral Daily   fluticasone  2 spray Each Nare BID   gabapentin  100 mg Oral Daily   gabapentin  300 mg Oral QHS   icosapent Ethyl  2 g Oral BID   [START ON 06/08/2020] influenza vaccine adjuvanted  0.5 mL Intramuscular Once   insulin aspart  0-15 Units Subcutaneous TID WC   lidocaine  1 patch Transdermal Q24H   multivitamin with minerals  1 tablet Oral Daily   mupirocin ointment  1 application Nasal BID   Muscle Rub  1 application Topical BID   nutrition supplement (JUVEN)  1 packet Per Tube BID BM   ondansetron  4 mg Oral BID   oxybutynin  5 mg Oral Daily   pantoprazole  40 mg Oral Daily   saccharomyces boulardii  250 mg Oral BID   sertraline  150 mg Oral QHS   tamsulosin  0.4 mg Oral QPC supper   vitamin B-12  500 mcg Oral Daily   zinc sulfate  220 mg Oral Daily   Continuous Infusions:  ceFEPime (MAXIPIME) IV 2 g (06/05/20 0514)   feeding  supplement (OSMOLITE 1.5 CAL) 1,000 mL (06/05/20 1244)   lactated ringers 75 mL/hr at 06/05/20 0245   magnesium sulfate bolus IVPB     metronidazole 500 mg (06/05/20 0847)   potassium chloride 10 mEq (06/05/20 1249)   vancomycin 1,000 mg (06/05/20 0201)     LOS: 4 days     Cordelia Poche, MD [facility] 06/05/2020, 1:42 PM  If 7PM-7AM, please contact night-coverage [email or web address]

## 2020-06-05 NOTE — NC FL2 (Signed)
[place] LEVEL OF CARE SCREENING TOOL     IDENTIFICATION  Patient Name: Brent [name]. Birthdate: 01-12-1946 Sex: male Admission Date (Current Location): 06/01/2020  [facility] and [place] Number:  Herbalist and Address:  [facility], [place] [address], [place], [place] [address]      Provider Number: [id number]  Attending Physician Name and Address:  Mariel Aloe, MD  Relative Name and Phone Number:       Current Level of Care: Hospital Recommended Level of Care: [place] Prior Approval Number:    Date Approved/Denied:   PASRR Number: [id number]  Discharge Plan: SNF    Current Diagnoses: Patient Active Problem List   Diagnosis Date Noted  . Psoas abscess, right ([place]) 06/05/2020  . Gram-negative bacteremia 06/05/2020  . Malnutrition of moderate degree 06/04/2020  . Lumbar discitis 06/02/2020  . Abscess, gluteal, left 06/02/2020  . Palliative care by specialist   . Goals of care, counseling/discussion   . FTT (failure to thrive) in adult 06/01/2020  . Abdominal pain   . E. coli UTI   . Sacral ulcer, with necrosis of muscle ([place]) 04/09/2020  . 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[place])   . Transverse myelitis ([place])   . Labile blood pressure   . Labile blood glucose   . Neurogenic bladder   . Neurogenic bowel   . Acute blood loss anemia   . Transaminitis   . Controlled type 2 diabetes mellitus with hyperglycemia, with long-term current use of insulin ([place])   . Spinal cord infarction ([place]) 03/25/2020  . Quadriplegia and quadriparesis ([place])   . Dyslipidemia   . HCAP (healthcare-associated pneumonia)   . Acute on chronic respiratory failure with hypoxia ([place])   . Acute hypoxemic respiratory failure ([place])   . Quadriplegia ([place])   . Coronary artery disease involving native coronary artery of native heart without angina pectoris   . Steroid-induced hyperglycemia   . Diabetic peripheral neuropathy ([place])   .  Tachypnea   . Hyponatremia   . AKI (acute kidney injury) ([place])   . Weakness 03/09/2020  . Chest pain 03/08/2020  . Right leg numbness   . Malignant melanoma of left side of neck ([place]) 10/25/2018  . Facial neuritis 10/25/2018  . Myofascial pain syndrome 10/25/2018  . Occipital neuralgia of left side 10/25/2018  . CAP (community acquired pneumonia) 09/08/2017  . Viral URI with cough 08/02/2017  . Male hypogonadism 06/20/2016  . Renal lesion 06/20/2016  . Insomnia 08/07/2015  . Enlarged prostate with lower urinary tract symptoms (LUTS) 11/07/2014  . Balanitis 07/30/2012  . Bladder neck obstruction 07/30/2012  . Prostate nodule 06/18/2012  . Recurrent nephrolithiasis 06/18/2012  . Urinary urgency 06/18/2012  . Benign neoplasm of colon 08/29/2011  . DEPRESSION, SITUATIONAL, ACUTE 01/23/2010  . Essential hypertension 05/30/2009  . Coronary atherosclerosis 05/30/2009  . Obstructive sleep apnea 11/28/2008  . OBESITY 09/23/2008  . ERECTILE DYSFUNCTION 11/26/2007  . Well controlled type 2 diabetes mellitus with peripheral circulatory disorder ([place]) 05/09/2007  . Hyperlipidemia 05/09/2007  . MYOCARDIAL INFARCTION, HX OF 05/09/2007  . DIVERTICULOSIS, COLON 05/09/2007    Orientation RESPIRATION BLADDER Height & Weight     Self, Place  Normal Incontinent, External catheter Weight: 265 lb (120.2 kg) Height:  6' (182.9 cm)  BEHAVIORAL SYMPTOMS/MOOD NEUROLOGICAL BOWEL NUTRITION STATUS      Colostomy (LLQ colostomy, new) Feeding tube  AMBULATORY STATUS COMMUNICATION OF NEEDS Skin   Total Care Verbally Other (Comment) (Left gluteal  abscess/necrotic sacral woundL4-L5 discitis/osteomyelitis; Pressure injurySacral, left anterior ankle, right anterior ankle, right heel, left heel)       PU Stage 4 Dressing: BID (foam and gauze dressing twice daily)               Personal Care Assistance Level of Assistance  Bathing, Feeding, Dressing Bathing Assistance: Maximum assistance Feeding  assistance: Maximum assistance Dressing Assistance: Maximum assistance     Functional Limitations Info  Sight, Hearing, Speech Sight Info: Impaired Hearing Info: Adequate Speech Info: Adequate    SPECIAL CARE FACTORS FREQUENCY  PT (By licensed PT), OT (By licensed OT)     PT Frequency: 5x week OT Frequency: 5x week            Contractures Contractures Info: Not present    Additional Factors Info  Code Status, Allergies, Psychotropic Code Status Info: Full Code Allergies Info: Levofloxacin, Penicillins Psychotropic Info: sertraline (ZOLOFT) tablet 150 mg daily at bedtime         Current Medications (06/05/2020):  This is the current hospital active medication list Current Facility-Administered Medications  Medication Dose Route Frequency Provider Last Rate Last Admin  . acetaminophen (TYLENOL) tablet 650 mg  650 mg Oral Q6H PRN Wynetta Fines T, MD      . albuterol (PROVENTIL) (2.5 MG/3ML) 0.083% nebulizer solution 2.5 mg  2.5 mg Nebulization Once PRN Wynetta Fines T, MD      . Ampicillin-Sulbactam (UNASYN) 3 g in sodium chloride 0.9 % 100 mL IVPB  3 g Intravenous Q6H Donnamae Jude, [facility]      . antiseptic oral rinse (BIOTENE) solution 10 mL  10 mL Mouth Rinse BID Wynetta Fines T, MD   10 mL at 06/05/20 1240  . ascorbic acid (VITAMIN C) tablet 500 mg  500 mg Oral Daily Wynetta Fines T, MD   500 mg at 06/05/20 1200  . aspirin EC tablet 81 mg  81 mg Oral QHS Lequita Halt, MD   81 mg at 06/04/20 2151  . atorvastatin (LIPITOR) tablet 20 mg  20 mg Oral Daily Wynetta Fines T, MD   20 mg at 06/05/20 1208  . bisacodyl (DULCOLAX) suppository 10 mg  10 mg Rectal Daily PRN Wynetta Fines T, MD      . Chlorhexidine Gluconate Cloth 2 % PADS 6 each  6 each Topical Daily Arrien, Jimmy Picket, MD   6 each at 06/05/20 1427  . Chlorhexidine Gluconate Cloth 2 % PADS 6 each  6 each Topical Q0600 Mariel Aloe, MD   6 each at 06/05/20 [phone]  . cholestyramine (QUESTRAN) packet 4 g  4 g Oral Daily Wynetta Fines T, MD      . diclofenac Sodium (VOLTAREN) 1 % topical gel 2 g  2 g Topical BID Wynetta Fines T, MD   2 g at 06/05/20 1243  . diphenhydrAMINE (BENADRYL) capsule 25 mg  25 mg Oral Q6H PRN Michel Bickers, MD       Or  . diphenhydrAMINE (BENADRYL) injection 25 mg  25 mg Intravenous Q6H PRN Michel Bickers, MD      . dronabinol (MARINOL) capsule 2.5 mg  2.5 mg Oral BID AC Wynetta Fines T, MD   2.5 mg at 06/05/20 1213  . enoxaparin (LOVENOX) injection 40 mg  40 mg Subcutaneous Q24H Wynetta Fines T, MD   40 mg at 06/04/20 2151  . feeding supplement (OSMOLITE 1.5 CAL) liquid 1,000 mL  1,000 mL Per Tube Continuous Mariel Aloe, MD 40 mL/hr  at 06/05/20 1244 1,000 mL at 06/05/20 1244  . feeding supplement (PROSource TF) liquid 90 mL  90 mL Per Tube BID Mariel Aloe, MD   90 mL at 06/05/20 1238  . fluconazole (DIFLUCAN) tablet 100 mg  100 mg Oral Daily Donnamae Jude, RPH   100 mg at 06/05/20 1201  . fluticasone (FLONASE) 50 MCG/ACT nasal spray 2 spray  2 spray Each Nare BID Lequita Halt, MD   2 spray at 06/05/20 1229  . gabapentin (NEURONTIN) capsule 100 mg  100 mg Oral Daily Wynetta Fines T, MD   100 mg at 06/05/20 1211  . gabapentin (NEURONTIN) capsule 300 mg  300 mg Oral QHS Wynetta Fines T, MD   300 mg at 06/04/20 2150  . icosapent Ethyl (VASCEPA) 1 g capsule 2 g  2 g Oral BID Wynetta Fines T, MD   2 g at 06/04/20 2150  . [START ON 06/08/2020] influenza vaccine adjuvanted (FLUAD) injection 0.5 mL  0.5 mL Intramuscular Once Arrien, Jimmy Picket, MD      . insulin aspart (novoLOG) injection 0-15 Units  0-15 Units Subcutaneous Q4H Mariel Aloe, MD      . lactated ringers infusion   Intravenous Continuous Tawni Millers, MD 75 mL/hr at 06/05/20 0245 New Bag at 06/05/20 0245  . lidocaine (LIDODERM) 5 % 1 patch  1 patch Transdermal Q24H Lequita Halt, MD   1 patch at 06/04/20 2149  . lidocaine (XYLOCAINE) 2 % viscous mouth solution 15 mL  15 mL Mouth/Throat Q4H PRN Wynetta Fines T, MD   15 mL at  06/04/20 0527  . lip balm (CARMEX) ointment   Topical PRN Wynetta Fines T, MD      . multivitamin with minerals tablet 1 tablet  1 tablet Oral Daily Wynetta Fines T, MD      . mupirocin ointment (BACTROBAN) 2 % 1 application  1 application Nasal BID Mariel Aloe, MD   1 application at 83/66/29 1219  . Muscle Rub CREA 1 application  1 application Topical BID Lequita Halt, MD   1 application at 47/65/46 1243  . nutrition supplement (JUVEN) (JUVEN) powder packet 1 packet  1 packet Per Tube BID BM Mariel Aloe, MD   1 packet at 06/05/20 1517  . ondansetron (ZOFRAN-ODT) disintegrating tablet 4 mg  4 mg Oral BID Wynetta Fines T, MD   4 mg at 06/05/20 1201  . oxybutynin (DITROPAN) tablet 5 mg  5 mg Oral Daily Wynetta Fines T, MD   5 mg at 06/05/20 1212  . pantoprazole (PROTONIX) EC tablet 40 mg  40 mg Oral Daily Wynetta Fines T, MD   40 mg at 06/05/20 1210  . saccharomyces boulardii (FLORASTOR) capsule 250 mg  250 mg Oral BID Wynetta Fines T, MD   250 mg at 06/05/20 1201  . sertraline (ZOLOFT) tablet 150 mg  150 mg Oral QHS Wynetta Fines T, MD   150 mg at 06/04/20 2150  . sodium chloride flush (NS) 0.9 % injection 10-40 mL  10-40 mL Intracatheter PRN Mariel Aloe, MD      . tamsulosin (FLOMAX) capsule 0.4 mg  0.4 mg Oral QPC supper Wynetta Fines T, MD   0.4 mg at 06/04/20 1719  . traMADol (ULTRAM) tablet 50 mg  50 mg Oral Q12H PRN Wynetta Fines T, MD      . vitamin B-12 (CYANOCOBALAMIN) tablet 500 mcg  500 mcg Oral Daily Lequita Halt, MD   500  mcg at 06/05/20 1217  . zinc sulfate capsule 220 mg  220 mg Oral Daily Lequita Halt, MD         Discharge Medications: Please see discharge summary for a list of discharge medications.  Relevant Imaging Results:  Relevant Lab Results:   Additional Information SS#: [id number]  [place], [place]

## 2020-06-05 NOTE — Anesthesia Postprocedure Evaluation (Signed)
Anesthesia Post Note  Patient: Brent [name].  Procedure(s) Performed: LAPAROSCOPY ASSISTED COLOSTOMY, LAPAROSCOPY GASTROSTOMY (N/A Abdomen) IRRIGATION AND DEBRIDEMENT SACRAL WOUND (N/A Buttocks) LYSIS OF ADHESION (N/A Abdomen)     Patient location during evaluation: PACU Anesthesia Type: General Level of consciousness: awake and alert Pain management: pain level controlled Vital Signs Assessment: post-procedure vital signs reviewed and stable Respiratory status: spontaneous breathing, nonlabored ventilation, respiratory function stable and patient connected to nasal cannula oxygen Cardiovascular status: blood pressure returned to baseline and stable Postop Assessment: no apparent nausea or vomiting Anesthetic complications: no   No complications documented.  Last Vitals:  Vitals:   06/04/20 2049 06/05/20 0403  BP: 124/62 (!) 120/54  Pulse: 67 64  Resp: 16 16  Temp: 36.4 C (!) 36.4 C  SpO2: 97% 96%    Last Pain:  Vitals:   06/05/20 0403  TempSrc: Oral  PainSc:                  Nichlas Pitera COKER

## 2020-06-05 NOTE — Social Work (Signed)
Call received back from pt's wife who reports agreeable to SNF and is willing for pt to return to [facility] if they are able to meet his needs but would also like to see if any other facilities in [place] are able to offer a bed. Will begin SNF search and f/u with offers.   Wandra Feinstein, MSW, LCSW [phone] (coverage)

## 2020-06-06 LAB — URINALYSIS, ROUTINE W REFLEX MICROSCOPIC
Bilirubin Urine: NEGATIVE
Glucose, UA: >=500 mg/dL — AB
Ketones, ur: NEGATIVE mg/dL
Nitrite: NEGATIVE
Protein, ur: NEGATIVE mg/dL
Specific Gravity, Urine: 1.022 (ref 1.005–1.030)
pH: 7 (ref 5.0–8.0)

## 2020-06-06 LAB — GLUCOSE, CAPILLARY
Glucose-Capillary: 199 mg/dL — ABNORMAL HIGH (ref 70–99)
Glucose-Capillary: 220 mg/dL — ABNORMAL HIGH (ref 70–99)
Glucose-Capillary: 216 mg/dL — ABNORMAL HIGH (ref 70–99)
Glucose-Capillary: 236 mg/dL — ABNORMAL HIGH (ref 70–99)
Glucose-Capillary: 273 mg/dL — ABNORMAL HIGH (ref 70–99)

## 2020-06-06 LAB — BASIC METABOLIC PANEL
Anion gap: 7 (ref 5–15)
BUN: 6 mg/dL — ABNORMAL LOW (ref 8–23)
CO2: 25 mmol/L (ref 22–32)
Calcium: 8.2 mg/dL — ABNORMAL LOW (ref 8.9–10.3)
Chloride: 105 mmol/L (ref 98–111)
Creatinine, Ser: <0.3 mg/dL — ABNORMAL LOW (ref 0.61–1.24)
Glucose, Bld: 222 mg/dL — ABNORMAL HIGH (ref 70–99)
Potassium: 2.9 mmol/L — ABNORMAL LOW (ref 3.5–5.1)
Sodium: 137 mmol/L (ref 135–145)

## 2020-06-06 LAB — FOLATE: Folate: 8.5 ng/mL (ref >5.9)

## 2020-06-06 LAB — MAGNESIUM: Magnesium: 1.8 mg/dL (ref 1.7–2.4)

## 2020-06-06 LAB — HEPATITIS B SURFACE ANTIGEN: Hepatitis B Surface Ag: NONREACTIVE

## 2020-06-06 LAB — PHOSPHORUS
Phosphorus: 1.5 mg/dL — ABNORMAL LOW (ref 2.5–4.6)
Phosphorus: 2.5 mg/dL (ref 2.5–4.6)

## 2020-06-06 LAB — HEPATITIS B CORE ANTIBODY, TOTAL: Hep B Core Total Ab: NONREACTIVE

## 2020-06-06 LAB — VITAMIN B12: Vitamin B-12: 781 pg/mL (ref 180–914)

## 2020-06-06 LAB — AMMONIA: Ammonia: 29 umol/L (ref 9–35)

## 2020-06-06 LAB — TSH: TSH: 4.252 u[IU]/mL (ref 0.350–4.500)

## 2020-06-06 MED ORDER — MAGNESIUM SULFATE 2 GM/50ML IV SOLN
2.0000 g | Freq: Once | INTRAVENOUS | Status: AC
  Administered 2020-06-06: 2 g via INTRAVENOUS
  Filled 2020-06-06: qty 50

## 2020-06-06 MED ORDER — POTASSIUM CHLORIDE 20 MEQ PO PACK
40.0000 meq | PACK | Freq: Two times a day (BID) | ORAL | Status: AC
  Administered 2020-06-06 (×2): 40 meq
  Filled 2020-06-06 (×2): qty 2

## 2020-06-06 MED ORDER — POTASSIUM PHOSPHATES 15 MMOLE/5ML IV SOLN
45.0000 mmol | Freq: Once | INTRAVENOUS | Status: AC
  Administered 2020-06-06: 45 mmol via INTRAVENOUS
  Filled 2020-06-06: qty 15

## 2020-06-06 NOTE — Progress Notes (Signed)
PROGRESS NOTE    Brent [name].  DDU:[id number] DOB: [birth year] DOA: 06/01/2020 PCP: [name], [name], MD   Brief Narrative: Brent [name]. is a 74 y.o. male with a history of transverse myelitis status post rituximab, paraplegia secondary to transverse myelitis, hypertension, hyperlipidemia, insulin-dependent diabetes mellitus, CAD.  Patient presented secondary to trouble eating and failure to thrive and was found to have a left gluteal abscess in addition to discitis/osteomyelitis on imaging.  Patient started empirically on antibiotics for management in addition to general surgery consult for debridement of sacral wound/abscess.  Patient started empirically on IV antibiotics.   Assessment & Plan:   Principal Problem:   Abscess, gluteal, left Active Problems:   Hyperlipidemia   Essential hypertension   Transverse myelitis (HCC)   Sacral ulcer, with necrosis of muscle (HCC)   FTT (failure to thrive) in adult   Palliative care by specialist   Goals of care, counseling/discussion   Lumbar discitis   Malnutrition of moderate degree   Psoas abscess, right (HCC)   Gram-negative bacteremia   Left gluteal abscess/necrotic sacral wound L4-L5 discitis/osteomyelitis Proteus mirabilis bacteremia General surgery consulted for debridement.  Blood cultures significant for proteus mirabilis in 1 out of 4 samples.  Unlikely that this is a contaminant in setting of gluteal abscess and discitis. Patient is s/p diverting loop colostomy and debridement on 9/16. -Infectious disease recommendations: Unasyn -General surgery recommendations: tube feeds, dysphagia 3 diet, BID dressing changes, D/C rectal tube when output slowed  History of transverse myelitis with paraplegia Plan for SNF -PT/OT  Dysphagia Esophagram performed on 9/14 without evidence of esophageal dysmotility. Likely oral phase disability. Speech therapy consulted. Complicated by Xerostomia. GI consulted who  recommends ENT consult. -SLP recommendations: Dysphagia 3 diet  Failure to thrive In setting of above. G-tube placed per general surgery. -Tube feeds -Daily BMP, Magnesium, Phosphorus  Abdominal nodule Unknown etiology. Patient states he has had it for some time. Otherwise unremarkable.  Hypokalemia Supplementation. Worse in setting of hypomagnesemia. Also will be complicated by starting tube feeds -BMP as above  Hypomagnesemia -IV magnesium repletion as needed  Diabetes mellitus, type II Hemoglobin A1c of 6.6%. -Continue sliding scale insulin  Hyperlipidemia -Continue atorvastatin  Essential hypertension On lisinopril as an outpatient which was held on admission.  Blood pressure has been mostly controlled while inpatient.  Confusion Intermittent. Unsure of etiology. -Vitamin B12, folate, TSH  Pressure injury Sacral, left anterior ankle, right anterior ankle, right heel, left heel.  All present on admission   DVT prophylaxis: Lovenox Code Status:   Code Status: Full Code Family Communication: Wife on telephone (14 minutes) Disposition Plan: Discharge likely to SNF in several days pending stability on tube feeds, general surgery recommendations and ID recommendations for outpatient antibiotic regimen   Consultants:   General surgery  Gastroenterology  Infectious disease  Procedures:   Debridement/diverting loop colostomy/Gastrostomy tube placement  Antimicrobials:  Vancomycin  Cefepime  Flagyl  Unasyn  Subjective: No issues  Objective: Vitals:   06/05/20 1500 06/05/20 1940 06/06/20 0400 06/06/20 0500  BP: (!) 119/52 (!) 106/49 128/79   Pulse: 71 72 79   Resp: 16 17 18    Temp: 98.6 F (37 C) 98 F (36.7 C) 98.7 F (37.1 C)   TempSrc: Oral Oral    SpO2: 98% 100% 100%   Weight:    120.2 kg  Height:        Intake/Output Summary (Last 24 hours) at 06/06/2020 1356 Last data filed at 06/06/2020  0900 Gross per 24 hour  Intake 1188.75 ml    Output 1300 ml  Net -111.25 ml   Filed Weights   06/01/20 1400 06/05/20 0403 06/06/20 0500  Weight: 108 kg 120.2 kg 120.2 kg    Examination:  General exam: Appears calm and comfortable Respiratory system: Clear to auscultation. Respiratory effort normal. Cardiovascular system: S1 & S2 heard, RRR. No murmurs, rubs, gallops or clicks. Gastrointestinal system: Abdomen is nondistended, soft and nontender. No organomegaly or masses felt. Normal bowel sounds heard. Colostomy bag with air and brown stool Central nervous system: Alert and oriented to person, place, time and situation. No focal neurological deficits. Musculoskeletal: No edema. No calf tenderness Skin: No cyanosis. No rashes Psychiatry: Mentions we are not in his hospital room but downstairs, mentions being in the parking lot but then denies that when asked directly about [place] being in the parking lot. Blunt affect.    Data Reviewed: I have personally reviewed following labs and imaging studies  CBC Lab Results  Component Value Date   WBC 5.3 06/05/2020   RBC 3.26 (L) 06/05/2020   HGB 8.3 (L) 06/05/2020   HCT 26.8 (L) 06/05/2020   MCV 82.2 06/05/2020   MCH 25.5 (L) 06/05/2020   PLT 297 06/05/2020   MCHC 31.0 06/05/2020   RDW 14.0 06/05/2020   LYMPHSABS 0.6 (L) 06/02/2020   MONOABS 0.4 06/02/2020   EOSABS 0.0 06/02/2020   BASOSABS 0.0 16/06/9603     Last metabolic panel Lab Results  Component Value Date   NA 137 06/06/2020   K 2.9 (L) 06/06/2020   CL 105 06/06/2020   CO2 25 06/06/2020   BUN 6 (L) 06/06/2020   CREATININE <0.30 (L) 06/06/2020   GLUCOSE 222 (H) 06/06/2020   GFRNONAA NOT CALCULATED 06/06/2020   GFRAA NOT CALCULATED 06/06/2020   CALCIUM 8.2 (L) 06/06/2020   PHOS 1.5 (L) 06/06/2020   PROT 5.3 (L) 06/01/2020   ALBUMIN 1.5 (L) 06/01/2020   LABGLOB 2.0 04/11/2019   AGRATIO 2.3 (H) 04/11/2019   BILITOT 0.7 06/01/2020   ALKPHOS 134 (H) 06/01/2020   AST 62 (H) 06/01/2020   ALT 68 (H)  06/01/2020   ANIONGAP 7 06/06/2020    CBG (last 3)  Recent Labs    06/06/20 0343 06/06/20 0809 06/06/20 1145  GLUCAP 199* 220* 216*     GFR: CrCl cannot be calculated (This lab value cannot be used to calculate CrCl because it is not a number: <0.30).  Coagulation Profile: No results for input(s): INR, PROTIME in the last 168 hours.  Recent Results (from the past 240 hour(s))  SARS Coronavirus 2 by RT PCR (hospital order, performed in [facility] hospital lab) Nasopharyngeal Nasopharyngeal Swab     Status: None   Collection Time: 06/01/20 12:24 PM   Specimen: Nasopharyngeal Swab  Result Value Ref Range Status   SARS Coronavirus 2 NEGATIVE NEGATIVE Final    Comment: (NOTE) SARS-CoV-2 target nucleic acids are NOT DETECTED.  The SARS-CoV-2 RNA is generally detectable in upper and lower respiratory specimens during the acute phase of infection. The lowest concentration of SARS-CoV-2 viral copies this assay can detect is 250 copies / mL. A negative result does not preclude SARS-CoV-2 infection and should not be used as the sole basis for treatment or other patient management decisions.  A negative result may occur with improper specimen collection / handling, submission of specimen other than nasopharyngeal swab, presence of viral mutation(s) within the areas targeted by this assay, and inadequate number of  viral copies (<250 copies / mL). A negative result must be combined with clinical observations, patient history, and epidemiological information.  Fact Sheet for Patients:   StrictlyIdeas.no  Fact Sheet for Healthcare Providers: BankingDealers.co.za  This test is not yet approved or  cleared by the [place] FDA and has been authorized for detection and/or diagnosis of SARS-CoV-2 by FDA under an Emergency Use Authorization (EUA).  This EUA will remain in effect (meaning this test can be used) for the duration of  the COVID-19 declaration under Section 564(b)(1) of the Act, 21 U.S.C. section 360bbb-3(b)(1), unless the authorization is terminated or revoked sooner.  Performed at [facility], [place] [address], [place], [place] [address]   Blood culture (routine x 2)     Status: Abnormal   Collection Time: 06/01/20 12:30 PM   Specimen: BLOOD  Result Value Ref Range Status   Specimen Description BLOOD SITE NOT SPECIFIED  Final   Special Requests   Final    BOTTLES DRAWN AEROBIC AND ANAEROBIC Blood Culture adequate volume   Culture  Setup Time   Final    AEROBIC BOTTLE ONLY GRAM NEGATIVE RODS CRITICAL VALUE NOTED.  VALUE IS CONSISTENT WITH PREVIOUSLY REPORTED AND CALLED VALUE.    Culture (A)  Final    PROTEUS MIRABILIS SUSCEPTIBILITIES PERFORMED ON PREVIOUS CULTURE WITHIN THE LAST 5 DAYS. Performed at [facility], [place] [address] [place], [place] [address]    Report Status 06/05/2020 FINAL  Final  Blood culture (routine x 2)     Status: Abnormal   Collection Time: 06/01/20 12:35 PM   Specimen: BLOOD  Result Value Ref Range Status   Specimen Description BLOOD SITE NOT SPECIFIED  Final   Special Requests   Final    BOTTLES DRAWN AEROBIC AND ANAEROBIC Blood Culture adequate volume   Culture  Setup Time   Final    GRAM NEGATIVE RODS AEROBIC BOTTLE ONLY CRITICAL RESULT CALLED TO, READ BACK BY AND VERIFIED WITH: PHARMD E.FULOIPEN AT 3875 ON 06/02/2020 BY T.SAAD Performed at [facility], [place] [address], [place], [place] [address]    Culture PROTEUS MIRABILIS (A)  Final   Report Status 06/04/2020 FINAL  Final   Organism ID, Bacteria PROTEUS MIRABILIS  Final      Susceptibility   Proteus mirabilis - MIC*    AMPICILLIN <=2 SENSITIVE Sensitive     CEFAZOLIN <=4 SENSITIVE Sensitive     CEFEPIME <=0.12 SENSITIVE Sensitive     CEFTAZIDIME <=1 SENSITIVE Sensitive     CEFTRIAXONE <=0.25 SENSITIVE Sensitive     CIPROFLOXACIN <=0.25 SENSITIVE Sensitive     GENTAMICIN <=1 SENSITIVE  Sensitive     IMIPENEM 4 SENSITIVE Sensitive     TRIMETH/SULFA >=320 RESISTANT Resistant     AMPICILLIN/SULBACTAM <=2 SENSITIVE Sensitive     PIP/TAZO <=4 SENSITIVE Sensitive     * PROTEUS MIRABILIS  Blood Culture ID Panel (Reflexed)     Status: Abnormal   Collection Time: 06/01/20 12:35 PM  Result Value Ref Range Status   Enterococcus faecalis NOT DETECTED NOT DETECTED Final   Enterococcus Faecium NOT DETECTED NOT DETECTED Final   Listeria monocytogenes NOT DETECTED NOT DETECTED Final   Staphylococcus species NOT DETECTED NOT DETECTED Final   Staphylococcus aureus (BCID) NOT DETECTED NOT DETECTED Final   Staphylococcus epidermidis NOT DETECTED NOT DETECTED Final   Staphylococcus lugdunensis NOT DETECTED NOT DETECTED Final   Streptococcus species NOT DETECTED NOT DETECTED Final   Streptococcus agalactiae NOT DETECTED NOT  DETECTED Final   Streptococcus pneumoniae NOT DETECTED NOT DETECTED Final   Streptococcus pyogenes NOT DETECTED NOT DETECTED Final   A.calcoaceticus-baumannii NOT DETECTED NOT DETECTED Final   Bacteroides fragilis NOT DETECTED NOT DETECTED Final   Enterobacterales DETECTED (A) NOT DETECTED Final    Comment: Enterobacterales represent a large order of gram negative bacteria, not a single organism. CRITICAL RESULT CALLED TO, READ BACK BY AND VERIFIED WITH: PHARMD E.FULOIPEN AT 1205 ON 06/02/2020 BY T.SAAD    Enterobacter cloacae complex NOT DETECTED NOT DETECTED Final   Escherichia coli NOT DETECTED NOT DETECTED Final   Klebsiella aerogenes NOT DETECTED NOT DETECTED Final   Klebsiella oxytoca NOT DETECTED NOT DETECTED Final   Klebsiella pneumoniae NOT DETECTED NOT DETECTED Final   Proteus species DETECTED (A) NOT DETECTED Final    Comment: CRITICAL RESULT CALLED TO, READ BACK BY AND VERIFIED WITH: PHARMD E.FULOIPEN AT 1205 ON 06/02/2020 BY T.SAAD    Salmonella species NOT DETECTED NOT DETECTED Final   Serratia marcescens NOT DETECTED NOT DETECTED Final    Haemophilus influenzae NOT DETECTED NOT DETECTED Final   Neisseria meningitidis NOT DETECTED NOT DETECTED Final   Pseudomonas aeruginosa NOT DETECTED NOT DETECTED Final   Stenotrophomonas maltophilia NOT DETECTED NOT DETECTED Final   Candida albicans NOT DETECTED NOT DETECTED Final   Candida auris NOT DETECTED NOT DETECTED Final   Candida glabrata NOT DETECTED NOT DETECTED Final   Candida krusei NOT DETECTED NOT DETECTED Final   Candida parapsilosis NOT DETECTED NOT DETECTED Final   Candida tropicalis NOT DETECTED NOT DETECTED Final   Cryptococcus neoformans/gattii NOT DETECTED NOT DETECTED Final   CTX-M ESBL NOT DETECTED NOT DETECTED Final   Carbapenem resistance IMP NOT DETECTED NOT DETECTED Final   Carbapenem resistance KPC NOT DETECTED NOT DETECTED Final   Carbapenem resistance NDM NOT DETECTED NOT DETECTED Final   Carbapenem resist OXA 48 LIKE NOT DETECTED NOT DETECTED Final   Carbapenem resistance VIM NOT DETECTED NOT DETECTED Final    Comment: Performed at [facility], [address] [place], [place] [address]  Surgical pcr screen     Status: Abnormal   Collection Time: 06/04/20  9:13 AM   Specimen: Nasal Mucosa; Nasal Swab  Result Value Ref Range Status   MRSA, PCR NEGATIVE NEGATIVE Final   Staphylococcus aureus POSITIVE (A) NEGATIVE Final    Comment: (NOTE) The Xpert SA Assay (FDA approved for NASAL specimens in patients 36 years of age and older), is one component of a comprehensive surveillance program. It is not intended to diagnose infection nor to guide or monitor treatment. Performed at [facility], [place] [address] [place], [place] [address]   Aerobic/Anaerobic Culture (surgical/deep wound)     Status: None (Preliminary result)   Collection Time: 06/04/20  1:43 PM   Specimen: Soft Tissue, Other  Result Value Ref Range Status   Specimen Description WOUND  Final   Special Requests SACRAL  Final   Gram Stain   Final    NO WBC SEEN NO ORGANISMS  SEEN Performed at [facility], [address] [place], [place] [address]    Culture FEW PROTEUS MIRABILIS FEW ENTEROCOCCUS FAECALIS   Final   Report Status PENDING  Incomplete        Radiology Studies: DG CHEST PORT 1 VIEW  Result Date: 06/04/2020 CLINICAL DATA:  PICC line placement EXAM: PORTABLE CHEST 1 VIEW COMPARISON:  None. FINDINGS: The heart size and mediastinal contours are within normal limits. A left-sided  central venous catheter seen at the SVC/brachiocephalic junction. Aortic knob calcifications are seen. Both lungs are clear. The visualized skeletal structures are unremarkable. IMPRESSION: Left-sided central venous catheter is seen with the tip in the SVC brachiocephalic junction. Electronically Signed   By: Prudencio Pair M.D.   On: 06/04/2020 16:27        Scheduled Meds: . antiseptic oral rinse  10 mL Mouth Rinse BID  . vitamin C  500 mg Oral Daily  . aspirin EC  81 mg Oral QHS  . atorvastatin  20 mg Oral Daily  . Chlorhexidine Gluconate Cloth  6 each Topical Daily  . Chlorhexidine Gluconate Cloth  6 each Topical Q0600  . cholestyramine  4 g Oral Daily  . diclofenac Sodium  2 g Topical BID  . dronabinol  2.5 mg Oral BID AC  . enoxaparin (LOVENOX) injection  40 mg Subcutaneous Q24H  . feeding supplement (PROSource TF)  90 mL Per Tube BID  . fluconazole  100 mg Oral Daily  . fluticasone  2 spray Each Nare BID  . gabapentin  100 mg Oral Daily  . gabapentin  300 mg Oral QHS  . icosapent Ethyl  2 g Oral BID  . [START ON 06/08/2020] influenza vaccine adjuvanted  0.5 mL Intramuscular Once  . insulin aspart  0-15 Units Subcutaneous Q4H  . lidocaine  1 patch Transdermal Q24H  . multivitamin with minerals  1 tablet Oral Daily  . mupirocin ointment  1 application Nasal BID  . Muscle Rub  1 application Topical BID  . nutrition supplement (JUVEN)  1 packet Per Tube BID BM  . ondansetron  4 mg Oral BID  . oxybutynin  5 mg Oral Daily  . pantoprazole  40 mg Oral  Daily  . potassium chloride  40 mEq Per Tube BID  . saccharomyces boulardii  250 mg Oral BID  . sertraline  150 mg Oral QHS  . tamsulosin  0.4 mg Oral QPC supper  . vitamin B-12  500 mcg Oral Daily  . zinc sulfate  220 mg Oral Daily   Continuous Infusions: . ampicillin-sulbactam (UNASYN) IV 3 g (06/06/20 1022)  . feeding supplement (OSMOLITE 1.5 CAL) 60 mL/hr at 06/06/20 0645  . lactated ringers 75 mL/hr at 06/06/20 1329  . magnesium sulfate bolus IVPB    . potassium PHOSPHATE IVPB (in mmol) 45 mmol (06/06/20 1333)     LOS: 5 days     Cordelia Poche, MD [facility] 06/06/2020, 1:56 PM  If 7PM-7AM, please contact night-coverage [email or web address]

## 2020-06-06 NOTE — Progress Notes (Signed)
2 Days Post-Op  Subjective: Patient very confused this am.  No c/o otherwise.  ROS: unable due to AMS  Objective: Vital signs in last 24 hours: Temp:  [98 F (36.7 C)-98.7 F (37.1 C)] 98.7 F (37.1 C) (09/18 0400) Pulse Rate:  [71-79] 79 (09/18 0400) Resp:  [16-18] 18 (09/18 0400) BP: (106-128)/(49-79) 128/79 (09/18 0400) SpO2:  [98 %-100 %] 100 % (09/18 0400) Weight:  [120.2 kg] 120.2 kg (09/18 0500) Last BM Date: 06/05/20  Intake/Output from previous day: 09/17 0701 - 09/18 0700 In: 1197 [P.O.:297; I.V.:600; NG/GT:200; IV Piggyback:100] Out: 1300 [Urine:1200; Stool:100] Intake/Output this shift: Total I/O In: 168.8 [I.V.:93.8; NG/GT:75] Out: -   PE: Abd: soft, doesn't seem very tender, LUQ colostomy in place with lots of stool present.  g-tube in place with TFs running.   Skin: decubitus is clean and packed.  Lab Results:  Recent Labs    06/04/20 1356 06/05/20 1200  WBC  --  5.3  HGB 8.2* 8.3*  HCT 24.0* 26.8*  PLT  --  297   BMET Recent Labs    06/05/20 0432 06/06/20 0315  NA 137 137  K 2.8* 2.9*  CL 105 105  CO2 21* 25  GLUCOSE 167* 222*  BUN 7* 6*  CREATININE 0.32* <0.30*  CALCIUM 8.8* 8.2*   PT/INR No results for input(s): LABPROT, INR in the last 72 hours. CMP     Component Value Date/Time   NA 137 06/06/2020 0315   NA 140 04/11/2019 1028   K 2.9 (L) 06/06/2020 0315   CL 105 06/06/2020 0315   CO2 25 06/06/2020 0315   GLUCOSE 222 (H) 06/06/2020 0315   BUN 6 (L) 06/06/2020 0315   BUN 21 04/11/2019 1028   CREATININE <0.30 (L) 06/06/2020 0315   CREATININE 0.87 04/15/2016 1133   CALCIUM 8.2 (L) 06/06/2020 0315   PROT 5.3 (L) 06/01/2020 1248   PROT 6.6 01/20/2020 0754   ALBUMIN 1.5 (L) 06/01/2020 1248   ALBUMIN 4.2 03/10/2020 1757   AST 62 (H) 06/01/2020 1248   ALT 68 (H) 06/01/2020 1248   ALKPHOS 134 (H) 06/01/2020 1248   BILITOT 0.7 06/01/2020 1248   BILITOT 0.3 01/20/2020 0754   GFRNONAA NOT CALCULATED 06/06/2020 0315    GFRNONAA 87 04/15/2016 1133   GFRAA NOT CALCULATED 06/06/2020 0315   GFRAA >89 04/15/2016 1133   Lipase     Component Value Date/Time   LIPASE 29 03/08/2020 1142       Studies/Results: DG CHEST PORT 1 VIEW  Result Date: 06/04/2020 CLINICAL DATA:  PICC line placement EXAM: PORTABLE CHEST 1 VIEW COMPARISON:  None. FINDINGS: The heart size and mediastinal contours are within normal limits. A left-sided central venous catheter seen at the SVC/brachiocephalic junction. Aortic knob calcifications are seen. Both lungs are clear. The visualized skeletal structures are unremarkable. IMPRESSION: Left-sided central venous catheter is seen with the tip in the SVC brachiocephalic junction. Electronically Signed   By: Prudencio Pair M.D.   On: 06/04/2020 16:27    Anti-infectives: Anti-infectives (From admission, onward)   Start     Dose/Rate Route Frequency Ordered Stop   06/05/20 2200  Ampicillin-Sulbactam (UNASYN) 3 g in sodium chloride 0.9 % 100 mL IVPB        3 g 200 mL/hr over 30 Minutes Intravenous Every 6 hours 06/05/20 1522     06/02/20 1000  fluconazole (DIFLUCAN) tablet 100 mg        100 mg Oral Daily 06/01/20 1815 06/16/20 0959  06/02/20 0200  vancomycin (VANCOCIN) IVPB 1000 mg/200 mL premix  Status:  Discontinued        1,000 mg 200 mL/hr over 60 Minutes Intravenous Every 12 hours 06/01/20 1739 06/05/20 1348   06/01/20 2200  ceFEPIme (MAXIPIME) 2 g in sodium chloride 0.9 % 100 mL IVPB  Status:  Discontinued        2 g 200 mL/hr over 30 Minutes Intravenous Every 8 hours 06/01/20 1418 06/05/20 1453   06/01/20 1830  fluconazole (DIFLUCAN) tablet 200 mg        200 mg Oral  Once 06/01/20 1815 06/02/20 1701   06/01/20 1700  metroNIDAZOLE (FLAGYL) IVPB 500 mg  Status:  Discontinued        500 mg 100 mL/hr over 60 Minutes Intravenous Every 8 hours 06/01/20 1534 06/05/20 1348   06/01/20 1245  ceFEPIme (MAXIPIME) 2 g in sodium chloride 0.9 % 100 mL IVPB        2 g 200 mL/hr over 30 Minutes  Intravenous  Once 06/01/20 1233 06/01/20 1330   06/01/20 1230  vancomycin (VANCOREADY) IVPB 2000 mg/400 mL        2,000 mg 200 mL/hr over 120 Minutes Intravenous  Once 06/01/20 1229 06/01/20 1729       Assessment/Plan HTN HLD Transverse myelitis with paraplegia  Thoracic discitis FTT Hypokalemia  Dysphagia  POD2, S/p gastrostomy tube placement 9/16 Dr. Bobbye Morton - tolerating TF, advance to goal as tolerated - ok to use for meds as well Sacral wound with osteomyelitis,POD 2, s/p debridement Wound is clean and looks good.  Continue WD dressing changes  POD 2, S/p I&D and loop diverting colostomy 9/16 Dr. Bobbye Morton - [place] consult for new stoma -routine colostomy care.  PRN on Sunday  FEN: D3 diet, TF at 60 cc/h, ordered some K via g-tube VTE: lovenox ID: [email or web address]    LOS: 5 days    Henreitta Cea , [facility] 06/06/2020, 9:09 AM Please see Amion for pager number during day hours 7:00am-4:30pm or 7:00am -11:30am on weekends

## 2020-06-07 LAB — GLUCOSE, CAPILLARY
Glucose-Capillary: 175 mg/dL — ABNORMAL HIGH (ref 70–99)
Glucose-Capillary: 198 mg/dL — ABNORMAL HIGH (ref 70–99)
Glucose-Capillary: 198 mg/dL — ABNORMAL HIGH (ref 70–99)
Glucose-Capillary: 210 mg/dL — ABNORMAL HIGH (ref 70–99)
Glucose-Capillary: 210 mg/dL — ABNORMAL HIGH (ref 70–99)
Glucose-Capillary: 222 mg/dL — ABNORMAL HIGH (ref 70–99)
Glucose-Capillary: 235 mg/dL — ABNORMAL HIGH (ref 70–99)

## 2020-06-07 LAB — BASIC METABOLIC PANEL
Anion gap: 7 (ref 5–15)
BUN: 8 mg/dL (ref 8–23)
CO2: 25 mmol/L (ref 22–32)
Calcium: 8.2 mg/dL — ABNORMAL LOW (ref 8.9–10.3)
Chloride: 107 mmol/L (ref 98–111)
Creatinine, Ser: <0.3 mg/dL — ABNORMAL LOW (ref 0.61–1.24)
Glucose, Bld: 205 mg/dL — ABNORMAL HIGH (ref 70–99)
Potassium: 4.4 mmol/L (ref 3.5–5.1)
Sodium: 139 mmol/L (ref 135–145)

## 2020-06-07 LAB — PHOSPHORUS: Phosphorus: 3 mg/dL (ref 2.5–4.6)

## 2020-06-07 LAB — MAGNESIUM: Magnesium: 1.7 mg/dL (ref 1.7–2.4)

## 2020-06-07 MED ORDER — ACETAMINOPHEN 325 MG PO TABS: 650.0000 mg | ORAL_TABLET | Freq: Four times a day (QID) | ORAL | Status: DC | PRN

## 2020-06-07 MED ORDER — INSULIN GLARGINE 100 UNIT/ML ~~LOC~~ SOLN
15.0000 [IU] | Freq: Every day | SUBCUTANEOUS | Status: DC
  Administered 2020-06-07 – 2020-06-09 (×3): 15 [IU] via SUBCUTANEOUS
  Filled 2020-06-07 (×4): qty 0.15

## 2020-06-07 MED ORDER — INFLUENZA VAC A&B SA ADJ QUAD 0.5 ML IM PRSY
0.5000 mL | PREFILLED_SYRINGE | INTRAMUSCULAR | Status: DC | PRN
  Filled 2020-06-07: qty 0.5

## 2020-06-07 MED ORDER — CHOLESTYRAMINE 4 G PO PACK
4.0000 g | PACK | Freq: Every day | ORAL | Status: DC
  Administered 2020-06-08 – 2020-06-10 (×3): 4 g
  Filled 2020-06-07 (×3): qty 1

## 2020-06-07 MED ORDER — ZINC SULFATE 220 (50 ZN) MG PO CAPS
220.0000 mg | ORAL_CAPSULE | Freq: Every day | ORAL | Status: DC
  Administered 2020-06-08 – 2020-06-10 (×3): 220 mg
  Filled 2020-06-07 (×3): qty 1

## 2020-06-07 MED ORDER — SACCHAROMYCES BOULARDII 250 MG PO CAPS
250.0000 mg | ORAL_CAPSULE | Freq: Two times a day (BID) | ORAL | Status: DC
  Administered 2020-06-07 – 2020-06-10 (×6): 250 mg
  Filled 2020-06-07 (×6): qty 1

## 2020-06-07 MED ORDER — PANTOPRAZOLE SODIUM 40 MG PO PACK
40.0000 mg | PACK | Freq: Every day | ORAL | Status: DC
  Administered 2020-06-08 – 2020-06-10 (×3): 40 mg
  Filled 2020-06-07 (×4): qty 20

## 2020-06-07 MED ORDER — ASPIRIN 81 MG PO CHEW
81.0000 mg | CHEWABLE_TABLET | Freq: Every day | ORAL | Status: DC
  Administered 2020-06-08 – 2020-06-10 (×3): 81 mg
  Filled 2020-06-07 (×3): qty 1

## 2020-06-07 MED ORDER — OXYBUTYNIN CHLORIDE 5 MG PO TABS
5.0000 mg | ORAL_TABLET | Freq: Every day | ORAL | Status: DC
  Administered 2020-06-08 – 2020-06-10 (×3): 5 mg
  Filled 2020-06-07 (×3): qty 1

## 2020-06-07 MED ORDER — ACETAMINOPHEN 160 MG/5ML PO SOLN: 650.0000 mg | Freq: Four times a day (QID) | ORAL | Status: DC | PRN

## 2020-06-07 MED ORDER — DIPHENHYDRAMINE HCL 25 MG PO CAPS: 25.0000 mg | ORAL_CAPSULE | Freq: Four times a day (QID) | ORAL | Status: DC | PRN

## 2020-06-07 MED ORDER — ATORVASTATIN CALCIUM 10 MG PO TABS
20.0000 mg | ORAL_TABLET | Freq: Every day | ORAL | Status: DC
  Administered 2020-06-08 – 2020-06-10 (×3): 20 mg
  Filled 2020-06-07 (×3): qty 2

## 2020-06-07 MED ORDER — ADULT MULTIVITAMIN W/MINERALS CH
1.0000 | ORAL_TABLET | Freq: Every day | ORAL | Status: DC
  Administered 2020-06-08 – 2020-06-10 (×3): 1
  Filled 2020-06-07 (×3): qty 1

## 2020-06-07 MED ORDER — FLUCONAZOLE 100 MG PO TABS
100.0000 mg | ORAL_TABLET | Freq: Every day | ORAL | Status: DC
  Administered 2020-06-08 – 2020-06-10 (×3): 100 mg
  Filled 2020-06-07 (×3): qty 1

## 2020-06-07 MED ORDER — GABAPENTIN 250 MG/5ML PO SOLN
300.0000 mg | Freq: Every day | ORAL | Status: DC
  Administered 2020-06-07 – 2020-06-09 (×3): 300 mg
  Filled 2020-06-07 (×4): qty 6

## 2020-06-07 MED ORDER — ASCORBIC ACID 500 MG PO TABS
500.0000 mg | ORAL_TABLET | Freq: Every day | ORAL | Status: DC
  Administered 2020-06-08 – 2020-06-10 (×3): 500 mg
  Filled 2020-06-07 (×3): qty 1

## 2020-06-07 MED ORDER — SERTRALINE HCL 50 MG PO TABS
150.0000 mg | ORAL_TABLET | Freq: Every day | ORAL | Status: DC
  Administered 2020-06-07 – 2020-06-09 (×3): 150 mg
  Filled 2020-06-07 (×3): qty 1

## 2020-06-07 MED ORDER — DIPHENHYDRAMINE HCL 50 MG/ML IJ SOLN: 25.0000 mg | Freq: Four times a day (QID) | INTRAMUSCULAR | Status: DC | PRN

## 2020-06-07 MED ORDER — GABAPENTIN 250 MG/5ML PO SOLN
100.0000 mg | Freq: Every day | ORAL | Status: DC
  Administered 2020-06-08 – 2020-06-10 (×3): 100 mg
  Filled 2020-06-07 (×4): qty 2

## 2020-06-07 MED ORDER — TRAMADOL HCL 50 MG PO TABS
50.0000 mg | ORAL_TABLET | Freq: Two times a day (BID) | ORAL | Status: DC | PRN
  Administered 2020-06-08: 50 mg
  Filled 2020-06-07: qty 1

## 2020-06-07 NOTE — Progress Notes (Signed)
PROGRESS NOTE    Brent [name].  URK:[id number] DOB: December 22, 1945 DOA: 06/01/2020 PCP: [name], [name], MD   Brief Narrative: Brent [name]. is a 74 y.o. male with a history of transverse myelitis status post rituximab, paraplegia secondary to transverse myelitis, hypertension, hyperlipidemia, insulin-dependent diabetes mellitus, CAD.  Patient presented secondary to trouble eating and failure to thrive and was found to have a left gluteal abscess in addition to discitis/osteomyelitis on imaging.  Patient started empirically on antibiotics for management in addition to general surgery consult for debridement of sacral wound/abscess.  Patient started empirically on IV antibiotics.   Assessment & Plan:   Principal Problem:   Abscess, gluteal, left Active Problems:   Hyperlipidemia   Essential hypertension   Transverse myelitis (HCC)   Sacral ulcer, with necrosis of muscle (HCC)   FTT (failure to thrive) in adult   Palliative care by specialist   Goals of care, counseling/discussion   Lumbar discitis   Malnutrition of moderate degree   Psoas abscess, right (HCC)   Gram-negative bacteremia   Left gluteal abscess/necrotic sacral wound L4-L5 discitis/osteomyelitis Proteus mirabilis bacteremia General surgery consulted for debridement.  Blood cultures significant for proteus mirabilis in 1 out of 4 samples.  Unlikely that this is a contaminant in setting of gluteal abscess and discitis. Patient is s/p diverting loop colostomy and debridement on 9/16. Rectal tube discontinued. -Infectious disease recommendations: Unasyn -General surgery recommendations: tube feeds, dysphagia 3 diet, BID dressing changes  History of transverse myelitis with paraplegia Plan for SNF -PT/OT  Dysphagia Esophagram performed on 9/14 without evidence of esophageal dysmotility. Likely oral phase disability. Speech therapy consulted. Complicated by Xerostomia. GI consulted who recommends  ENT consult. -SLP recommendations: Dysphagia 3 diet  Failure to thrive In setting of above. G-tube placed per general surgery. Tube feeds at goal rate -Tube feeds -Daily BMP, Magnesium, Phosphorus  Abdominal nodule Unknown etiology. Patient states he has had it for some time. Otherwise unremarkable.  Hypokalemia Supplementation. Worse in setting of hypomagnesemia. Also will be complicated by starting tube feeds. Improved with supplementation -BMP as above  Hypomagnesemia -IV magnesium repletion as needed  Diabetes mellitus, type II Hemoglobin A1c of 6.6%. -Continue sliding scale insulin  Hyperlipidemia -Continue atorvastatin  Essential hypertension On lisinopril as an outpatient which was held on admission.  Blood pressure has been mostly controlled while inpatient.  Confusion Intermittent. Unsure of etiology. Folate, B12, TSH normal. Appears to be resolved today.  Pressure injury Sacral, left anterior ankle, right anterior ankle, right heel, left heel.  All present on admission   DVT prophylaxis: Lovenox Code Status:   Code Status: Full Code Family Communication: None at bedside Disposition Plan: Discharge likely to SNF in 1-3 days pending stability on tube feeds, general surgery recommendations and ID recommendations for outpatient antibiotic regimen and duration   Consultants:   General surgery  Gastroenterology  Infectious disease  Procedures:   Debridement/diverting loop colostomy/Gastrostomy tube placement  Antimicrobials:  Vancomycin  Cefepime  Flagyl  Unasyn  Subjective: No concerns today  Objective: Vitals:   06/06/20 1534 06/06/20 2032 06/07/20 0451 06/07/20 1347  BP: (!) 142/67 120/60 123/64 126/63  Pulse: 79 75 71 73  Resp: 18 19 18 17   Temp: 97.9 F (36.6 C) 98.2 F (36.8 C) 97.9 F (36.6 C) 98.4 F (36.9 C)  TempSrc: Oral Oral Oral Oral  SpO2: 99% 99% 100% 97%  Weight:      Height:        Intake/Output  Summary (Last 24  hours) at 06/07/2020 1352 Last data filed at 06/07/2020 1348 Gross per 24 hour  Intake 1191.69 ml  Output 2850 ml  Net -1658.31 ml   Filed Weights   06/01/20 1400 06/05/20 0403 06/06/20 0500  Weight: 108 kg 120.2 kg 120.2 kg    Examination:  General exam: Appears calm and comfortable Respiratory system: Clear to auscultation. Respiratory effort normal. Cardiovascular system: S1 & S2 heard, RRR. No murmurs, rubs, gallops or clicks. Gastrointestinal system: Abdomen is nondistended, soft and nontender. No organomegaly or masses felt. Normal bowel sounds heard. Central nervous system: Alert and oriented. No focal neurological deficits. Musculoskeletal: Upper/LE edema. No calf tenderness Skin: No cyanosis. No rashes Psychiatry: Judgement and insight appear normal. Mood & affect appropriate.     Data Reviewed: I have personally reviewed following labs and imaging studies  CBC Lab Results  Component Value Date   WBC 5.3 06/05/2020   RBC 3.26 (L) 06/05/2020   HGB 8.3 (L) 06/05/2020   HCT 26.8 (L) 06/05/2020   MCV 82.2 06/05/2020   MCH 25.5 (L) 06/05/2020   PLT 297 06/05/2020   MCHC 31.0 06/05/2020   RDW 14.0 06/05/2020   LYMPHSABS 0.6 (L) 06/02/2020   MONOABS 0.4 06/02/2020   EOSABS 0.0 06/02/2020   BASOSABS 0.0 94/70/9628     Last metabolic panel Lab Results  Component Value Date   NA 139 06/07/2020   K 4.4 06/07/2020   CL 107 06/07/2020   CO2 25 06/07/2020   BUN 8 06/07/2020   CREATININE <0.30 (L) 06/07/2020   GLUCOSE 205 (H) 06/07/2020   GFRNONAA NOT CALCULATED 06/07/2020   GFRAA NOT CALCULATED 06/07/2020   CALCIUM 8.2 (L) 06/07/2020   PHOS 3.0 06/07/2020   PROT 5.3 (L) 06/01/2020   ALBUMIN 1.5 (L) 06/01/2020   LABGLOB 2.0 04/11/2019   AGRATIO 2.3 (H) 04/11/2019   BILITOT 0.7 06/01/2020   ALKPHOS 134 (H) 06/01/2020   AST 62 (H) 06/01/2020   ALT 68 (H) 06/01/2020   ANIONGAP 7 06/07/2020    CBG (last 3)  Recent Labs    06/07/20 0433 06/07/20 0753  06/07/20 1232  GLUCAP 210* 210* 198*     GFR: CrCl cannot be calculated (This lab value cannot be used to calculate CrCl because it is not a number: <0.30).  Coagulation Profile: No results for input(s): INR, PROTIME in the last 168 hours.  Recent Results (from the past 240 hour(s))  SARS Coronavirus 2 by RT PCR (hospital order, performed in [facility] hospital lab) Nasopharyngeal Nasopharyngeal Swab     Status: None   Collection Time: 06/01/20 12:24 PM   Specimen: Nasopharyngeal Swab  Result Value Ref Range Status   SARS Coronavirus 2 NEGATIVE NEGATIVE Final    Comment: (NOTE) SARS-CoV-2 target nucleic acids are NOT DETECTED.  The SARS-CoV-2 RNA is generally detectable in upper and lower respiratory specimens during the acute phase of infection. The lowest concentration of SARS-CoV-2 viral copies this assay can detect is 250 copies / mL. A negative result does not preclude SARS-CoV-2 infection and should not be used as the sole basis for treatment or other patient management decisions.  A negative result may occur with improper specimen collection / handling, submission of specimen other than nasopharyngeal swab, presence of viral mutation(s) within the areas targeted by this assay, and inadequate number of viral copies (<250 copies / mL). A negative result must be combined with clinical observations, patient history, and epidemiological information.  Fact Sheet for Patients:  StrictlyIdeas.no  Fact Sheet for Healthcare Providers: BankingDealers.co.za  This test is not yet approved or  cleared by the [place] FDA and has been authorized for detection and/or diagnosis of SARS-CoV-2 by FDA under an Emergency Use Authorization (EUA).  This EUA will remain in effect (meaning this test can be used) for the duration of the COVID-19 declaration under Section 564(b)(1) of the Act, 21 U.S.C. section 360bbb-3(b)(1), unless the  authorization is terminated or revoked sooner.  Performed at [facility], [place] [address] [place], [place] [address]   Blood culture (routine x 2)     Status: Abnormal   Collection Time: 06/01/20 12:30 PM   Specimen: BLOOD  Result Value Ref Range Status   Specimen Description BLOOD SITE NOT SPECIFIED  Final   Special Requests   Final    BOTTLES DRAWN AEROBIC AND ANAEROBIC Blood Culture adequate volume   Culture  Setup Time   Final    AEROBIC BOTTLE ONLY GRAM NEGATIVE RODS CRITICAL VALUE NOTED.  VALUE IS CONSISTENT WITH PREVIOUSLY REPORTED AND CALLED VALUE.    Culture (A)  Final    PROTEUS MIRABILIS SUSCEPTIBILITIES PERFORMED ON PREVIOUS CULTURE WITHIN THE LAST 5 DAYS. Performed at [facility], [place] [address] [place], [place] [address]    Report Status 06/05/2020 FINAL  Final  Blood culture (routine x 2)     Status: Abnormal   Collection Time: 06/01/20 12:35 PM   Specimen: BLOOD  Result Value Ref Range Status   Specimen Description BLOOD SITE NOT SPECIFIED  Final   Special Requests   Final    BOTTLES DRAWN AEROBIC AND ANAEROBIC Blood Culture adequate volume   Culture  Setup Time   Final    GRAM NEGATIVE RODS AEROBIC BOTTLE ONLY CRITICAL RESULT CALLED TO, READ BACK BY AND VERIFIED WITH: PHARMD E.FULOIPEN AT 6144 ON 06/02/2020 BY T.SAAD Performed at [facility], [place] [address], [place], [place] [address]    Culture PROTEUS MIRABILIS (A)  Final   Report Status 06/04/2020 FINAL  Final   Organism ID, Bacteria PROTEUS MIRABILIS  Final      Susceptibility   Proteus mirabilis - MIC*    AMPICILLIN <=2 SENSITIVE Sensitive     CEFAZOLIN <=4 SENSITIVE Sensitive     CEFEPIME <=0.12 SENSITIVE Sensitive     CEFTAZIDIME <=1 SENSITIVE Sensitive     CEFTRIAXONE <=0.25 SENSITIVE Sensitive     CIPROFLOXACIN <=0.25 SENSITIVE Sensitive     GENTAMICIN <=1 SENSITIVE Sensitive     IMIPENEM 4 SENSITIVE Sensitive     TRIMETH/SULFA >=320 RESISTANT Resistant      AMPICILLIN/SULBACTAM <=2 SENSITIVE Sensitive     PIP/TAZO <=4 SENSITIVE Sensitive     * PROTEUS MIRABILIS  Blood Culture ID Panel (Reflexed)     Status: Abnormal   Collection Time: 06/01/20 12:35 PM  Result Value Ref Range Status   Enterococcus faecalis NOT DETECTED NOT DETECTED Final   Enterococcus Faecium NOT DETECTED NOT DETECTED Final   Listeria monocytogenes NOT DETECTED NOT DETECTED Final   Staphylococcus species NOT DETECTED NOT DETECTED Final   Staphylococcus aureus (BCID) NOT DETECTED NOT DETECTED Final   Staphylococcus epidermidis NOT DETECTED NOT DETECTED Final   Staphylococcus lugdunensis NOT DETECTED NOT DETECTED Final   Streptococcus species NOT DETECTED NOT DETECTED Final   Streptococcus agalactiae NOT DETECTED NOT DETECTED Final   Streptococcus pneumoniae NOT DETECTED NOT DETECTED Final   Streptococcus pyogenes NOT DETECTED NOT DETECTED Final   A.calcoaceticus-baumannii NOT DETECTED NOT DETECTED  Final   Bacteroides fragilis NOT DETECTED NOT DETECTED Final   Enterobacterales DETECTED (A) NOT DETECTED Final    Comment: Enterobacterales represent a large order of gram negative bacteria, not a single organism. CRITICAL RESULT CALLED TO, READ BACK BY AND VERIFIED WITH: PHARMD E.FULOIPEN AT 1205 ON 06/02/2020 BY T.SAAD    Enterobacter cloacae complex NOT DETECTED NOT DETECTED Final   Escherichia coli NOT DETECTED NOT DETECTED Final   Klebsiella aerogenes NOT DETECTED NOT DETECTED Final   Klebsiella oxytoca NOT DETECTED NOT DETECTED Final   Klebsiella pneumoniae NOT DETECTED NOT DETECTED Final   Proteus species DETECTED (A) NOT DETECTED Final    Comment: CRITICAL RESULT CALLED TO, READ BACK BY AND VERIFIED WITH: PHARMD E.FULOIPEN AT 1205 ON 06/02/2020 BY T.SAAD    Salmonella species NOT DETECTED NOT DETECTED Final   Serratia marcescens NOT DETECTED NOT DETECTED Final   Haemophilus influenzae NOT DETECTED NOT DETECTED Final   Neisseria meningitidis NOT DETECTED NOT  DETECTED Final   Pseudomonas aeruginosa NOT DETECTED NOT DETECTED Final   Stenotrophomonas maltophilia NOT DETECTED NOT DETECTED Final   Candida albicans NOT DETECTED NOT DETECTED Final   Candida auris NOT DETECTED NOT DETECTED Final   Candida glabrata NOT DETECTED NOT DETECTED Final   Candida krusei NOT DETECTED NOT DETECTED Final   Candida parapsilosis NOT DETECTED NOT DETECTED Final   Candida tropicalis NOT DETECTED NOT DETECTED Final   Cryptococcus neoformans/gattii NOT DETECTED NOT DETECTED Final   CTX-M ESBL NOT DETECTED NOT DETECTED Final   Carbapenem resistance IMP NOT DETECTED NOT DETECTED Final   Carbapenem resistance KPC NOT DETECTED NOT DETECTED Final   Carbapenem resistance NDM NOT DETECTED NOT DETECTED Final   Carbapenem resist OXA 48 LIKE NOT DETECTED NOT DETECTED Final   Carbapenem resistance VIM NOT DETECTED NOT DETECTED Final    Comment: Performed at [facility], [address] [place], [place] [address]  Surgical pcr screen     Status: Abnormal   Collection Time: 06/04/20  9:13 AM   Specimen: Nasal Mucosa; Nasal Swab  Result Value Ref Range Status   MRSA, PCR NEGATIVE NEGATIVE Final   Staphylococcus aureus POSITIVE (A) NEGATIVE Final    Comment: (NOTE) The Xpert SA Assay (FDA approved for NASAL specimens in patients 90 years of age and older), is one component of a comprehensive surveillance program. It is not intended to diagnose infection nor to guide or monitor treatment. Performed at [facility], [place] [address] [place], [place] [address]   Aerobic/Anaerobic Culture (surgical/deep wound)     Status: None (Preliminary result)   Collection Time: 06/04/20  1:43 PM   Specimen: Soft Tissue, Other  Result Value Ref Range Status   Specimen Description WOUND  Final   Special Requests SACRAL  Final   Gram Stain   Final    NO WBC SEEN NO ORGANISMS SEEN Performed at [facility], [address], [place], [place] [address]    Culture   Final      FEW PROTEUS MIRABILIS FEW ENTEROCOCCUS FAECALIS SUSCEPTIBILITIES TO FOLLOW NO ANAEROBES ISOLATED; CULTURE IN PROGRESS FOR 5 DAYS    Report Status PENDING  Incomplete        Radiology Studies: No results found.      Scheduled Meds: . antiseptic oral rinse  10 mL Mouth Rinse BID  . [START ON 06/08/2020] vitamin C  500 mg Per Tube Daily  . [START ON 06/08/2020] aspirin  81 mg Per Tube Daily  . [  START ON 06/08/2020] atorvastatin  20 mg Per Tube Daily  . Chlorhexidine Gluconate Cloth  6 each Topical Daily  . Chlorhexidine Gluconate Cloth  6 each Topical Q0600  . [START ON 06/08/2020] cholestyramine  4 g Per Tube Daily  . diclofenac Sodium  2 g Topical BID  . enoxaparin (LOVENOX) injection  40 mg Subcutaneous Q24H  . feeding supplement (PROSource TF)  90 mL Per Tube BID  . [START ON 06/08/2020] fluconazole  100 mg Per Tube Daily  . fluticasone  2 spray Each Nare BID  . [START ON 06/08/2020] gabapentin  100 mg Per Tube Daily   And  . gabapentin  300 mg Per Tube QHS  . icosapent Ethyl  2 g Oral BID  . [START ON 06/08/2020] influenza vaccine adjuvanted  0.5 mL Intramuscular Once  . insulin aspart  0-15 Units Subcutaneous Q4H  . insulin glargine  15 Units Subcutaneous QHS  . lidocaine  1 patch Transdermal Q24H  . [START ON 06/08/2020] multivitamin with minerals  1 tablet Per Tube Daily  . mupirocin ointment  1 application Nasal BID  . Muscle Rub  1 application Topical BID  . nutrition supplement (JUVEN)  1 packet Per Tube BID BM  . ondansetron  4 mg Oral BID  . [START ON 06/08/2020] oxybutynin  5 mg Per Tube Daily  . [START ON 06/08/2020] pantoprazole sodium  40 mg Per Tube Daily  . saccharomyces boulardii  250 mg Per Tube BID  . sertraline  150 mg Per Tube QHS  . tamsulosin  0.4 mg Oral QPC supper  . vitamin B-12  500 mcg Oral Daily  . [START ON 06/08/2020] zinc sulfate  220 mg Per Tube Daily   Continuous Infusions: . ampicillin-sulbactam (UNASYN) IV 3 g (06/07/20 1014)  .  feeding supplement (OSMOLITE 1.5 CAL) 1,000 mL (06/07/20 0021)     LOS: 6 days     Cordelia Poche, MD [facility] 06/07/2020, 1:52 PM  If 7PM-7AM, please contact night-coverage [email or web address]

## 2020-06-07 NOTE — TOC Progression Note (Addendum)
Transition of Care (TOC) - Progression Note   MSW Intern reached out to patient's wife to advise her of 2 bed offers from [facility] and [facility]. Patient's wife expressed concern regarding [facility]' ability to provide the care patient will need, specifically daily attending to his ostomy bag, feeding tube and wound care; as patient was already at [facility]. Patient's wife requested that this issue be presented to [facility] staff prior to bed being accepted. MSW Intern reached out to [employer] and advised of concerns verbalized by patient's wife. Irine Seal stated she will have Pension scheme manager of Nursing call patient's wife to discuss. MSW Intern notified patient's wife.   TOC team will continue to follow for discharge planning needs.   Patient Details  Name: Brent [name]. MRN: [id number] Date of Birth: 1946/02/04  Transition of Care [facility]) CM/SW Contact  Andria Frames, [place] Work Phone Number: 06/07/2020, 2:50 PM  Clinical Narrative:            Expected Discharge Plan and Services                                                 Social Determinants of Health (SDOH) Interventions    Readmission Risk Interventions No flowsheet data found.

## 2020-06-08 ENCOUNTER — Inpatient Hospital Stay: Payer: Self-pay

## 2020-06-08 DIAGNOSIS — R7881 Bacteremia: Secondary | ICD-10-CM

## 2020-06-08 LAB — TYPE AND SCREEN
ABO/RH(D): O POS
Antibody Screen: NEGATIVE
Unit division: 0
Unit division: 0
Unit division: 0
Unit division: 0

## 2020-06-08 LAB — BPAM RBC
Blood Product Expiration Date: 202110132359
Blood Product Expiration Date: 202110152359
Blood Product Expiration Date: 202110152359
Blood Product Expiration Date: 202110152359
ISSUE DATE / TIME: 202109161201
ISSUE DATE / TIME: 202109161201
Unit Type and Rh: 5100
Unit Type and Rh: 5100
Unit Type and Rh: 5100
Unit Type and Rh: 5100

## 2020-06-08 LAB — BASIC METABOLIC PANEL
Anion gap: 8 (ref 5–15)
BUN: 7 mg/dL — ABNORMAL LOW (ref 8–23)
CO2: 25 mmol/L (ref 22–32)
Calcium: 8.2 mg/dL — ABNORMAL LOW (ref 8.9–10.3)
Chloride: 102 mmol/L (ref 98–111)
Creatinine, Ser: 0.34 mg/dL — ABNORMAL LOW (ref 0.61–1.24)
GFR calc Af Amer: >60 mL/min (ref >60)
GFR calc non Af Amer: >60 mL/min (ref >60)
Glucose, Bld: 178 mg/dL — ABNORMAL HIGH (ref 70–99)
Potassium: 4.4 mmol/L (ref 3.5–5.1)
Sodium: 135 mmol/L (ref 135–145)

## 2020-06-08 LAB — GLUCOSE, CAPILLARY
Glucose-Capillary: 207 mg/dL — ABNORMAL HIGH (ref 70–99)
Glucose-Capillary: 188 mg/dL — ABNORMAL HIGH (ref 70–99)
Glucose-Capillary: 183 mg/dL — ABNORMAL HIGH (ref 70–99)
Glucose-Capillary: 177 mg/dL — ABNORMAL HIGH (ref 70–99)
Glucose-Capillary: 209 mg/dL — ABNORMAL HIGH (ref 70–99)

## 2020-06-08 LAB — HEPATITIS B SURFACE ANTIBODY, QUANTITATIVE: Hep B S AB Quant (Post): 15.3 m[IU]/mL (ref >9.9)

## 2020-06-08 LAB — PHOSPHORUS: Phosphorus: 2.5 mg/dL (ref 2.5–4.6)

## 2020-06-08 LAB — MAGNESIUM: Magnesium: 1.5 mg/dL — ABNORMAL LOW (ref 1.7–2.4)

## 2020-06-08 MED ORDER — MAGNESIUM SULFATE 2 GM/50ML IV SOLN
2.0000 g | Freq: Once | INTRAVENOUS | Status: AC
  Administered 2020-06-08: 2 g via INTRAVENOUS
  Filled 2020-06-08: qty 50

## 2020-06-08 NOTE — TOC Initial Note (Addendum)
Transition of Care (TOC) - Initial/Assessment Note    Patient Details  Name: Brent [name]. MRN: [id number] Date of Birth: 18-Aug-1946  Transition of Care [facility]) CM/SW Contact:    Brent Mt, LCSW Phone Number: 06/08/2020, 11:48 AM  Clinical Narrative:    3:32pm- CSW spoke with Brent [name] at [facility], they have spoken with pt wife.                11:48am- CSW called and spoke with pt wife Brent [name] at [phone].  Introduced self, role, and reason for call. Confirmed home address and PCP. Pt wife and pt wifes friend Brent [name] aware of previous conversations with other [facility] staff. CSW reviewed notes- pt wife has been given both offers, no additional offers at this time. Pt wife and friend have several questions about 24/7 care at home, [place] benefits, which facilities provide skilled care, and logistically how Medicare coverage works. Answered all questions to the best of my ability. Explained that when pt cleared medically by care team to d/c, that we would need a plan in place and that we would need to choose from one of the available offers. Pt wife states understanding.    CSW will f/u on DON at [facility] calling pt wife to answer questions about pt care. Provided my number as well as [place] liaison number to pt wife.   Expected Discharge Plan: Skilled Nursing Facility Barriers to Discharge: Continued Medical Work up   Patient Goals and CMS Choice Patient states their goals for this hospitalization and ongoing recovery are:: to have appropriate care for wound care/feeding tube CMS Medicare.gov Compare Post Acute Care list provided to:: Patient Represenative (must comment) (pt wife) Choice offered to / list presented to : Spouse  Expected Discharge Plan and Services Expected Discharge Plan: [place] In-house Referral: Clinical Social Work Discharge Planning Services: CM Consult Post Acute Care Choice: [place]  arrangements for the past 2 months: [place], [place]  Prior Living Arrangements/Services Living arrangements for the past 2 months: [place], [place] Lives with:: Spouse, Relatives Patient language and need for interpreter reviewed:: Yes (no needs) Do you feel safe going back to the place where you live?: Yes      Need for Family Participation in Patient Care: Yes (Comment) (assistance w/ daily cares) Care giver support system in place?: Yes (comment) (pt spouse)   Criminal Activity/Legal Involvement Pertinent to Current Situation/Hospitalization: No - Comment as needed  Activities of Daily Living Home Assistive Devices/Equipment: CBG Meter, Eyeglasses, Grab bars around toilet, Grab bars in shower, Hand-held shower hose, Hospital bed, Raised toilet seat with rails, Shower chair with back, Walker (specify type), Transfer belt, Wheelchair ADL Screening (condition at time of admission) Patient's cognitive ability adequate to safely complete daily activities?: No Is the patient deaf or have difficulty hearing?: No Does the patient have difficulty seeing, even when wearing glasses/contacts?: No Does the patient have difficulty concentrating, remembering, or making decisions?: Yes Patient able to express need for assistance with ADLs?: No Does the patient have difficulty dressing or bathing?: Yes Independently performs ADLs?: No Communication: Independent Dressing (OT): Dependent Is this a change from baseline?: Pre-admission baseline Grooming: Dependent Is this a change from baseline?: Pre-admission baseline Feeding: Needs assistance Is this a change from baseline?: Pre-admission baseline Bathing: Dependent Is this a change from baseline?: Pre-admission baseline Toileting: Dependent Is this a change from baseline?: Pre-admission baseline In/Out Bed: Dependent  Is this a change from baseline?: Pre-admission baseline Walks in Home:  Dependent Is this a change from baseline?: Pre-admission baseline Does the patient have difficulty walking or climbing stairs?: Yes Weakness of Legs: Both Weakness of Arms/Hands: Both  Permission Sought/Granted Permission sought to share information with : Family Supports Permission granted to share information with : No (fluctuating mentation)  Share Information with NAME: Brent [name]  Permission granted to share info w AGENCY: SNFs  Permission granted to share info w Relationship: wife  Permission granted to share info w Contact Information: [phone]  Emotional Assessment Appearance:: Other (Comment Required (telephonic assessment w/ pt wife) Attitude/Demeanor/Rapport: Other (comment) (telephonic assessment w/ pt wife) Affect (typically observed): Other (comment) (telephonic assessment w/ pt wife) Orientation: : Oriented to Place, Fluctuating Orientation (Suspected and/or reported Sundowners), Oriented to Self Alcohol / Substance Use: Not Applicable Psych Involvement: No (comment)  Admission diagnosis:  Transverse myelitis ([place]) [G37.3] Bedsores [L89.90] Failure to thrive in adult [R62.7] FTT (failure to thrive) in adult [R62.7] Dysphagia [R13.10] Sacral ulcer, with necrosis of muscle ([place]) [V49.449] Patient Active Problem List   Diagnosis Date Noted  . Psoas abscess, right ([place]) 06/05/2020  . Gram-negative bacteremia 06/05/2020  . Malnutrition of moderate degree 06/04/2020  . Lumbar discitis 06/02/2020  . Abscess, gluteal, left 06/02/2020  . Palliative care by specialist   . Goals of care, counseling/discussion   . FTT (failure to thrive) in adult 06/01/2020  . Abdominal pain   . E. coli UTI   . Sacral ulcer, with necrosis of muscle ([place]) 04/09/2020  . 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 Transverse myelitis ([place])   . Labile blood pressure   . Labile blood glucose   . Neurogenic bladder   . Neurogenic bowel   . Acute blood loss anemia   . Transaminitis   . Controlled  type 2 diabetes mellitus with hyperglycemia, with long-term current use of insulin ([place])   . Spinal cord infarction ([place]) 03/25/2020  . Quadriplegia and quadriparesis ([place])   . Dyslipidemia   . HCAP (healthcare-associated pneumonia)   . Acute on chronic respiratory failure with hypoxia ([place])   . Acute hypoxemic respiratory failure ([place])   . Quadriplegia ([place])   . Coronary artery disease involving native coronary artery of native heart without angina pectoris   . Steroid-induced hyperglycemia   . Diabetic peripheral neuropathy ([place])   . Tachypnea   . Hyponatremia   . AKI (acute kidney injury) ([place])   . Weakness 03/09/2020  . Chest pain 03/08/2020  . Right leg numbness   . Malignant melanoma of left side of neck ([place]) 10/25/2018  . Facial neuritis 10/25/2018  . Myofascial pain syndrome 10/25/2018  . Occipital neuralgia of left side 10/25/2018  . CAP (community acquired pneumonia) 09/08/2017  . Viral URI with cough 08/02/2017  . Male hypogonadism 06/20/2016  . Renal lesion 06/20/2016  . Insomnia 08/07/2015  . Enlarged prostate with lower urinary tract symptoms (LUTS) 11/07/2014  . Balanitis 07/30/2012  . Bladder neck obstruction 07/30/2012  . Prostate nodule 06/18/2012  . Recurrent nephrolithiasis 06/18/2012  . Urinary urgency 06/18/2012  . Benign neoplasm of colon 08/29/2011  . DEPRESSION, SITUATIONAL, ACUTE 01/23/2010  . Essential hypertension 05/30/2009  . Coronary atherosclerosis 05/30/2009  . Obstructive sleep apnea 11/28/2008  . OBESITY 09/23/2008  . ERECTILE DYSFUNCTION 11/26/2007  . Well controlled type 2 diabetes mellitus with peripheral circulatory disorder ([place]) 05/09/2007  . Hyperlipidemia 05/09/2007  . MYOCARDIAL INFARCTION, HX OF 05/09/2007  . DIVERTICULOSIS, COLON 05/09/2007  PCP:  [name], [name], MD Pharmacy:   [id number] [place], [place] [address] [place]  [address] Phone: [phone] Fax: [phone]  [place] [id number] [place] [address] Phone: [phone] Fax: [phone]  Readmission Risk Interventions Readmission Risk Prevention Plan 06/08/2020  Transportation Screening Complete  Medication Review (RN Care Manager) Referral to Pharmacy  PCP or Specialist appointment within 3-5 days of discharge Not Complete  PCP/Specialist Appt Not Complete comments SNF resident  [facility] or [place] Complete  SW Recovery Care/Counseling Consult Complete  Palliative Care Screening Complete  Skilled Nursing Facility Complete  Some recent data might be hidden

## 2020-06-08 NOTE — Progress Notes (Signed)
PHARMACY CONSULT NOTE FOR:  OUTPATIENT  PARENTERAL ANTIBIOTIC THERAPY (OPAT)  Indication: sacral wound infection Regimen: Unasyn 3gm IV Q6H End date: 07/17/20  IV antibiotic discharge orders are pended. To discharging provider:  please sign these orders via discharge navigator,  Select New Orders & click on the button choice - Manage This Unsigned Work.     Thank you for allowing pharmacy to be a part of this patient's care.  Meagan Spease D. Mina Marble, PharmD, BCPS, [place] 06/08/2020, 12:42 PM

## 2020-06-08 NOTE — Progress Notes (Signed)
Patient ID: Brent [name]., male   DOB: 01-31-1946, 74 y.o.   MRN: [id number]         [facility]  Date of Admission:  06/01/2020           Day 4 ampicillin sulbactam ASSESSMENT: He has developed a large sacral pressure sore complicated by wound abscess, lumbar infection and Proteus bacteremia. I plan on at least 6 weeks of IV ampicillin sulbactam.  PLAN: 1. Recommend PICC 2. Continue ampicillin sulbactam 3. I have arranged follow-up in my clinic and will sign off now  Diagnosis: Sacral wound infection, lumbar infection and bacteremia  Culture Result: Proteus mirabilis  Allergies  Allergen Reactions  . Levofloxacin Rash  . Penicillins Hives and Rash    Tolerated Unasyn, cefepime and cephalexin    OPAT Orders Discharge antibiotics to be given via PICC line Discharge antibiotics: Per pharmacy protocol ampicillin sulbactam  Duration: 6 weeks End Date: 07/17/2020  [facility] Care Per Protocol:  Home health RN for IV administration and teaching; PICC line care and labs.    Labs weekly while on IV antibiotics: _x_ CBC with differential _x_ BMP __ CMP _x_ CRP _x_ ESR __ Vancomycin trough __ CK  _x_ Please pull PIC at completion of IV antibiotics __ Please leave PIC in place until doctor has seen patient or been notified  Fax weekly labs to [phone]  Clinic Follow Up Appt: 07/16/2028   Principal Problem:   Abscess, gluteal, left Active Problems:   Sacral ulcer, with necrosis of muscle (HCC)   Lumbar discitis   Psoas abscess, right (HCC)   Gram-negative bacteremia   Hyperlipidemia   Essential hypertension   Transverse myelitis (HCC)   FTT (failure to thrive) in adult   Palliative care by specialist   Goals of care, counseling/discussion   Malnutrition of moderate degree   Scheduled Meds: . antiseptic oral rinse  10 mL Mouth Rinse BID  . vitamin C  500 mg Per Tube Daily  . aspirin  81 mg Per Tube Daily  .  atorvastatin  20 mg Per Tube Daily  . Chlorhexidine Gluconate Cloth  6 each Topical Daily  . Chlorhexidine Gluconate Cloth  6 each Topical Q0600  . cholestyramine  4 g Per Tube Daily  . diclofenac Sodium  2 g Topical BID  . enoxaparin (LOVENOX) injection  40 mg Subcutaneous Q24H  . feeding supplement (PROSource TF)  90 mL Per Tube BID  . fluconazole  100 mg Per Tube Daily  . fluticasone  2 spray Each Nare BID  . gabapentin  100 mg Per Tube Daily   And  . gabapentin  300 mg Per Tube QHS  . icosapent Ethyl  2 g Oral BID  . insulin aspart  0-15 Units Subcutaneous Q4H  . insulin glargine  15 Units Subcutaneous QHS  . lidocaine  1 patch Transdermal Q24H  . multivitamin with minerals  1 tablet Per Tube Daily  . mupirocin ointment  1 application Nasal BID  . Muscle Rub  1 application Topical BID  . nutrition supplement (JUVEN)  1 packet Per Tube BID BM  . ondansetron  4 mg Oral BID  . oxybutynin  5 mg Per Tube Daily  . pantoprazole sodium  40 mg Per Tube Daily  . saccharomyces boulardii  250 mg Per Tube BID  . sertraline  150 mg Per Tube QHS  . tamsulosin  0.4 mg Oral QPC supper  . vitamin B-12  500 mcg Oral  Daily  . zinc sulfate  220 mg Per Tube Daily   Continuous Infusions: . ampicillin-sulbactam (UNASYN) IV 3 g (06/08/20 0931)  . feeding supplement (OSMOLITE 1.5 CAL) 1,000 mL (06/07/20 1803)   PRN Meds:.acetaminophen (TYLENOL) oral liquid 160 mg/5 mL, albuterol, bisacodyl, diphenhydrAMINE **OR** diphenhydrAMINE, influenza vaccine adjuvanted, lidocaine, lip balm, sodium chloride flush, traMADol   SUBJECTIVE: He is feeling better. He is having less trouble swallowing. His appetite has improved.  Review of Systems: Review of Systems  Constitutional: Positive for weight loss. Negative for chills, diaphoresis and fever.  Gastrointestinal: Negative for abdominal pain, nausea and vomiting.    Allergies  Allergen Reactions  . Levofloxacin Rash  . Penicillins Hives and Rash     Tolerated Unasyn, cefepime and cephalexin    OBJECTIVE: Vitals:   06/07/20 0451 06/07/20 1347 06/07/20 2028 06/08/20 0343  BP: 123/64 126/63 139/63 (!) 132/53  Pulse: 71 73 80   Resp: 18 17 18 18   Temp: 97.9 F (36.6 C) 98.4 F (36.9 C) 99.2 F (37.3 C) 98.5 F (36.9 C)  TempSrc: Oral Oral Oral Oral  SpO2: 100% 97% 98% 97%  Weight:      Height:       Body mass index is 35.94 kg/m.  Physical Exam Constitutional:      Comments: He is resting quietly in bed watching television.  Cardiovascular:     Rate and Rhythm: Normal rate and regular rhythm.     Heart sounds: No murmur heard.   Pulmonary:     Effort: Pulmonary effort is normal.     Breath sounds: Normal breath sounds.  Abdominal:     Palpations: Abdomen is soft.     Comments: Colostomy.     Lab Results Sed Rate (mm/hr)  Date Value  03/15/2020 48 (H)  02/03/2017 2    Lab Results  Component Value Date   WBC 5.3 06/05/2020   HGB 8.3 (L) 06/05/2020   HCT 26.8 (L) 06/05/2020   MCV 82.2 06/05/2020   PLT 297 06/05/2020    Lab Results  Component Value Date   CREATININE 0.34 (L) 06/08/2020   BUN 7 (L) 06/08/2020   NA 135 06/08/2020   K 4.4 06/08/2020   CL 102 06/08/2020   CO2 25 06/08/2020    Lab Results  Component Value Date   ALT 68 (H) 06/01/2020   AST 62 (H) 06/01/2020   ALKPHOS 134 (H) 06/01/2020   BILITOT 0.7 06/01/2020     Microbiology: Recent Results (from the past 240 hour(s))  SARS Coronavirus 2 by RT PCR (hospital order, performed in [facility] hospital lab) Nasopharyngeal Nasopharyngeal Swab     Status: None   Collection Time: 06/01/20 12:24 PM   Specimen: Nasopharyngeal Swab  Result Value Ref Range Status   SARS Coronavirus 2 NEGATIVE NEGATIVE Final    Comment: (NOTE) SARS-CoV-2 target nucleic acids are NOT DETECTED.  The SARS-CoV-2 RNA is generally detectable in upper and lower respiratory specimens during the acute phase of infection. The lowest concentration of SARS-CoV-2  viral copies this assay can detect is 250 copies / mL. A negative result does not preclude SARS-CoV-2 infection and should not be used as the sole basis for treatment or other patient management decisions.  A negative result may occur with improper specimen collection / handling, submission of specimen other than nasopharyngeal swab, presence of viral mutation(s) within the areas targeted by this assay, and inadequate number of viral copies (<250 copies / mL). A negative result must be combined  with clinical observations, patient history, and epidemiological information.  Fact Sheet for Patients:   StrictlyIdeas.no  Fact Sheet for Healthcare Providers: BankingDealers.co.za  This test is not yet approved or  cleared by the [place] FDA and has been authorized for detection and/or diagnosis of SARS-CoV-2 by FDA under an Emergency Use Authorization (EUA).  This EUA will remain in effect (meaning this test can be used) for the duration of the COVID-19 declaration under Section 564(b)(1) of the Act, 21 U.S.C. section 360bbb-3(b)(1), unless the authorization is terminated or revoked sooner.  Performed at [facility], [place] [address], [place], [place] [address]   Blood culture (routine x 2)     Status: Abnormal   Collection Time: 06/01/20 12:30 PM   Specimen: BLOOD  Result Value Ref Range Status   Specimen Description BLOOD SITE NOT SPECIFIED  Final   Special Requests   Final    BOTTLES DRAWN AEROBIC AND ANAEROBIC Blood Culture adequate volume   Culture  Setup Time   Final    AEROBIC BOTTLE ONLY GRAM NEGATIVE RODS CRITICAL VALUE NOTED.  VALUE IS CONSISTENT WITH PREVIOUSLY REPORTED AND CALLED VALUE.    Culture (A)  Final    PROTEUS MIRABILIS SUSCEPTIBILITIES PERFORMED ON PREVIOUS CULTURE WITHIN THE LAST 5 DAYS. Performed at [facility], [place] [address] [place], [place] [address]    Report Status 06/05/2020 FINAL  Final   Blood culture (routine x 2)     Status: Abnormal   Collection Time: 06/01/20 12:35 PM   Specimen: BLOOD  Result Value Ref Range Status   Specimen Description BLOOD SITE NOT SPECIFIED  Final   Special Requests   Final    BOTTLES DRAWN AEROBIC AND ANAEROBIC Blood Culture adequate volume   Culture  Setup Time   Final    GRAM NEGATIVE RODS AEROBIC BOTTLE ONLY CRITICAL RESULT CALLED TO, READ BACK BY AND VERIFIED WITH: PHARMD E.FULOIPEN AT 2409 ON 06/02/2020 BY T.SAAD Performed at [facility], [place] [address], [place], [place] [address]    Culture PROTEUS MIRABILIS (A)  Final   Report Status 06/04/2020 FINAL  Final   Organism ID, Bacteria PROTEUS MIRABILIS  Final      Susceptibility   Proteus mirabilis - MIC*    AMPICILLIN <=2 SENSITIVE Sensitive     CEFAZOLIN <=4 SENSITIVE Sensitive     CEFEPIME <=0.12 SENSITIVE Sensitive     CEFTAZIDIME <=1 SENSITIVE Sensitive     CEFTRIAXONE <=0.25 SENSITIVE Sensitive     CIPROFLOXACIN <=0.25 SENSITIVE Sensitive     GENTAMICIN <=1 SENSITIVE Sensitive     IMIPENEM 4 SENSITIVE Sensitive     TRIMETH/SULFA >=320 RESISTANT Resistant     AMPICILLIN/SULBACTAM <=2 SENSITIVE Sensitive     PIP/TAZO <=4 SENSITIVE Sensitive     * PROTEUS MIRABILIS  Blood Culture ID Panel (Reflexed)     Status: Abnormal   Collection Time: 06/01/20 12:35 PM  Result Value Ref Range Status   Enterococcus faecalis NOT DETECTED NOT DETECTED Final   Enterococcus Faecium NOT DETECTED NOT DETECTED Final   Listeria monocytogenes NOT DETECTED NOT DETECTED Final   Staphylococcus species NOT DETECTED NOT DETECTED Final   Staphylococcus aureus (BCID) NOT DETECTED NOT DETECTED Final   Staphylococcus epidermidis NOT DETECTED NOT DETECTED Final   Staphylococcus lugdunensis NOT DETECTED NOT DETECTED Final   Streptococcus species NOT DETECTED NOT DETECTED Final   Streptococcus agalactiae NOT DETECTED NOT DETECTED Final   Streptococcus pneumoniae NOT DETECTED NOT DETECTED Final  Streptococcus pyogenes NOT DETECTED NOT DETECTED Final   A.calcoaceticus-baumannii NOT DETECTED NOT DETECTED Final   Bacteroides fragilis NOT DETECTED NOT DETECTED Final   Enterobacterales DETECTED (A) NOT DETECTED Final    Comment: Enterobacterales represent a large order of gram negative bacteria, not a single organism. CRITICAL RESULT CALLED TO, READ BACK BY AND VERIFIED WITH: PHARMD E.FULOIPEN AT 1205 ON 06/02/2020 BY T.SAAD    Enterobacter cloacae complex NOT DETECTED NOT DETECTED Final   Escherichia coli NOT DETECTED NOT DETECTED Final   Klebsiella aerogenes NOT DETECTED NOT DETECTED Final   Klebsiella oxytoca NOT DETECTED NOT DETECTED Final   Klebsiella pneumoniae NOT DETECTED NOT DETECTED Final   Proteus species DETECTED (A) NOT DETECTED Final    Comment: CRITICAL RESULT CALLED TO, READ BACK BY AND VERIFIED WITH: PHARMD E.FULOIPEN AT 1205 ON 06/02/2020 BY T.SAAD    Salmonella species NOT DETECTED NOT DETECTED Final   Serratia marcescens NOT DETECTED NOT DETECTED Final   Haemophilus influenzae NOT DETECTED NOT DETECTED Final   Neisseria meningitidis NOT DETECTED NOT DETECTED Final   Pseudomonas aeruginosa NOT DETECTED NOT DETECTED Final   Stenotrophomonas maltophilia NOT DETECTED NOT DETECTED Final   Candida albicans NOT DETECTED NOT DETECTED Final   Candida auris NOT DETECTED NOT DETECTED Final   Candida glabrata NOT DETECTED NOT DETECTED Final   Candida krusei NOT DETECTED NOT DETECTED Final   Candida parapsilosis NOT DETECTED NOT DETECTED Final   Candida tropicalis NOT DETECTED NOT DETECTED Final   Cryptococcus neoformans/gattii NOT DETECTED NOT DETECTED Final   CTX-M ESBL NOT DETECTED NOT DETECTED Final   Carbapenem resistance IMP NOT DETECTED NOT DETECTED Final   Carbapenem resistance KPC NOT DETECTED NOT DETECTED Final   Carbapenem resistance NDM NOT DETECTED NOT DETECTED Final   Carbapenem resist OXA 48 LIKE NOT DETECTED NOT DETECTED Final   Carbapenem resistance VIM  NOT DETECTED NOT DETECTED Final    Comment: Performed at [facility], [address], [place], [place] [address]  Surgical pcr screen     Status: Abnormal   Collection Time: 06/04/20  9:13 AM   Specimen: Nasal Mucosa; Nasal Swab  Result Value Ref Range Status   MRSA, PCR NEGATIVE NEGATIVE Final   Staphylococcus aureus POSITIVE (A) NEGATIVE Final    Comment: (NOTE) The Xpert SA Assay (FDA approved for NASAL specimens in patients 24 years of age and older), is one component of a comprehensive surveillance program. It is not intended to diagnose infection nor to guide or monitor treatment. Performed at [facility], [place] [address], [place], [place] [address]   Aerobic/Anaerobic Culture (surgical/deep wound)     Status: None (Preliminary result)   Collection Time: 06/04/20  1:43 PM   Specimen: Soft Tissue, Other  Result Value Ref Range Status   Specimen Description WOUND  Final   Special Requests SACRAL  Final   Gram Stain   Final    NO WBC SEEN NO ORGANISMS SEEN Performed at [facility], [address], [place], [place] [address]    Culture   Final    FEW PROTEUS MIRABILIS FEW ENTEROCOCCUS FAECALIS NO ANAEROBES ISOLATED; CULTURE IN PROGRESS FOR 5 DAYS    Report Status PENDING  Incomplete   Organism ID, Bacteria PROTEUS MIRABILIS  Final   Organism ID, Bacteria ENTEROCOCCUS FAECALIS  Final      Susceptibility   Enterococcus faecalis - MIC*    AMPICILLIN <=2 SENSITIVE Sensitive     VANCOMYCIN 1 SENSITIVE Sensitive  GENTAMICIN SYNERGY SENSITIVE Sensitive     * FEW ENTEROCOCCUS FAECALIS   Proteus mirabilis - MIC*    AMPICILLIN <=2 SENSITIVE Sensitive     CEFAZOLIN <=4 SENSITIVE Sensitive     CEFEPIME <=0.12 SENSITIVE Sensitive     CEFTAZIDIME <=1 SENSITIVE Sensitive     CEFTRIAXONE <=0.25 SENSITIVE Sensitive     CIPROFLOXACIN <=0.25 SENSITIVE Sensitive     GENTAMICIN <=1 SENSITIVE Sensitive     IMIPENEM 2 SENSITIVE Sensitive     TRIMETH/SULFA >=320  RESISTANT Resistant     AMPICILLIN/SULBACTAM <=2 SENSITIVE Sensitive     PIP/TAZO <=4 SENSITIVE Sensitive     * FEW PROTEUS MIRABILIS    Michel Bickers, [place] [facility] [phone] pager   [phone] cell 06/08/2020, 12:18 PM

## 2020-06-08 NOTE — Progress Notes (Signed)
PROGRESS NOTE    Brent [name].  BSW:[id number] DOB: 1946/09/04 DOA: 06/01/2020 PCP: [name], [name], MD   Brief Narrative: Brent Gjerde. is a 74 y.o. male with a history of transverse myelitis status post rituximab, paraplegia secondary to transverse myelitis, hypertension, hyperlipidemia, insulin-dependent diabetes mellitus, CAD.  Patient presented secondary to trouble eating and failure to thrive and was found to have a left gluteal abscess in addition to discitis/osteomyelitis on imaging.  Patient started empirically on antibiotics for management in addition to general surgery consult for debridement of sacral wound/abscess.  Patient started empirically on IV antibiotics.   Assessment & Plan:   Principal Problem:   Abscess, gluteal, left Active Problems:   Hyperlipidemia   Essential hypertension   Transverse myelitis (HCC)   Sacral ulcer, with necrosis of muscle (HCC)   FTT (failure to thrive) in adult   Palliative care by specialist   Goals of care, counseling/discussion   Lumbar discitis   Malnutrition of moderate degree   Psoas abscess, right (HCC)   Gram-negative bacteremia   Left gluteal abscess/necrotic sacral wound L4-L5 discitis/osteomyelitis Proteus mirabilis bacteremia General surgery consulted for debridement.  Blood cultures significant for proteus mirabilis in 1 out of 4 samples.  Unlikely that this is a contaminant in setting of gluteal abscess and discitis. Patient is s/p diverting loop colostomy and debridement on 9/16. Rectal tube discontinued. -Infectious disease recommendations: Unasyn -General surgery recommendations: tube feeds, dysphagia 3 diet, BID dressing changes -Insert PICC line -Discontinue central line once PICC line placed  History of transverse myelitis with paraplegia Plan for SNF -PT/OT  Dysphagia Esophagram performed on 9/14 without evidence of esophageal dysmotility. Likely oral phase disability. Speech therapy  consulted. Complicated by Xerostomia. GI consulted who recommends ENT consult. Oral cavity appears moist on exam, however. -SLP recommendations: Dysphagia 3 diet  Failure to thrive In setting of above. G-tube placed per general surgery. Tube feeds at goal rate -Tube feeds -Daily BMP, Magnesium, Phosphorus  Abdominal nodule Unknown etiology. Patient states he has had it for some time. Otherwise unremarkable.  Hypokalemia Supplementation. Worse in setting of hypomagnesemia. Also will be complicated by starting tube feeds. Improved with supplementation -BMP as above  Hypomagnesemia -IV magnesium repletion as needed  Diabetes mellitus, type II Hemoglobin A1c of 6.6%. -Continue sliding scale insulin  Hyperlipidemia -Continue atorvastatin  Essential hypertension On lisinopril as an outpatient which was held on admission.  Blood pressure has been mostly controlled while inpatient.  Confusion Intermittent. Unsure of etiology. Folate, B12, TSH normal. Appears to be resolved.  Pressure injury Sacral, left anterior ankle, right anterior ankle, right heel, left heel.  All present on admission   DVT prophylaxis: Lovenox Code Status:   Code Status: Full Code Family Communication: None at bedside Disposition Plan: Discharge likely to SNF in 1-2 days pending stability on tube feeds, general surgery recommendations and ID recommendations for outpatient antibiotic regimen and duration   Consultants:   General surgery  Gastroenterology  Infectious disease  Procedures:   Debridement/diverting loop colostomy/Gastrostomy tube placement  Antimicrobials:  Vancomycin  Cefepime  Flagyl  Unasyn  Subjective: No issues overnight.  Objective: Vitals:   06/07/20 0451 06/07/20 1347 06/07/20 2028 06/08/20 0343  BP: 123/64 126/63 139/63 (!) 132/53  Pulse: 71 73 80   Resp: 18 17 18 18   Temp: 97.9 F (36.6 C) 98.4 F (36.9 C) 99.2 F (37.3 C) 98.5 F (36.9 C)  TempSrc: Oral  Oral Oral Oral  SpO2: 100% 97% 98%  97%  Weight:      Height:        Intake/Output Summary (Last 24 hours) at 06/08/2020 1251 Last data filed at 06/08/2020 0918 Gross per 24 hour  Intake 360 ml  Output 1425 ml  Net -1065 ml   Filed Weights   06/01/20 1400 06/05/20 0403 06/06/20 0500  Weight: 108 kg 120.2 kg 120.2 kg    Examination:  General exam: Appears calm and comfortable Respiratory system: Clear to auscultation. Respiratory effort normal. Cardiovascular system: S1 & S2 heard, RRR. No murmurs, rubs, gallops or clicks. Gastrointestinal system: Abdomen is nondistended, soft and nontender. Normal bowel sounds heard. Central nervous system: Alert and oriented. No focal neurological deficits. Musculoskeletal: No calf tenderness Skin: No cyanosis. No rashes Psychiatry: Judgement and insight appear normal. Mood & affect appropriate.     Data Reviewed: I have personally reviewed following labs and imaging studies  CBC Lab Results  Component Value Date   WBC 5.3 06/05/2020   RBC 3.26 (L) 06/05/2020   HGB 8.3 (L) 06/05/2020   HCT 26.8 (L) 06/05/2020   MCV 82.2 06/05/2020   MCH 25.5 (L) 06/05/2020   PLT 297 06/05/2020   MCHC 31.0 06/05/2020   RDW 14.0 06/05/2020   LYMPHSABS 0.6 (L) 06/02/2020   MONOABS 0.4 06/02/2020   EOSABS 0.0 06/02/2020   BASOSABS 0.0 89/38/1017     Last metabolic panel Lab Results  Component Value Date   NA 135 06/08/2020   K 4.4 06/08/2020   CL 102 06/08/2020   CO2 25 06/08/2020   BUN 7 (L) 06/08/2020   CREATININE 0.34 (L) 06/08/2020   GLUCOSE 178 (H) 06/08/2020   GFRNONAA >60 06/08/2020   GFRAA >60 06/08/2020   CALCIUM 8.2 (L) 06/08/2020   PHOS 2.5 06/08/2020   PROT 5.3 (L) 06/01/2020   ALBUMIN 1.5 (L) 06/01/2020   LABGLOB 2.0 04/11/2019   AGRATIO 2.3 (H) 04/11/2019   BILITOT 0.7 06/01/2020   ALKPHOS 134 (H) 06/01/2020   AST 62 (H) 06/01/2020   ALT 68 (H) 06/01/2020   ANIONGAP 8 06/08/2020    CBG (last 3)  Recent Labs     06/08/20 0343 06/08/20 0752 06/08/20 1139  GLUCAP 207* 188* 183*     GFR: Estimated Creatinine Clearance: 106.8 mL/min (A) (by C-G formula based on SCr of 0.34 mg/dL (L)).  Coagulation Profile: No results for input(s): INR, PROTIME in the last 168 hours.  Recent Results (from the past 240 hour(s))  SARS Coronavirus 2 by RT PCR (hospital order, performed in [facility] hospital lab) Nasopharyngeal Nasopharyngeal Swab     Status: None   Collection Time: 06/01/20 12:24 PM   Specimen: Nasopharyngeal Swab  Result Value Ref Range Status   SARS Coronavirus 2 NEGATIVE NEGATIVE Final    Comment: (NOTE) SARS-CoV-2 target nucleic acids are NOT DETECTED.  The SARS-CoV-2 RNA is generally detectable in upper and lower respiratory specimens during the acute phase of infection. The lowest concentration of SARS-CoV-2 viral copies this assay can detect is 250 copies / mL. A negative result does not preclude SARS-CoV-2 infection and should not be used as the sole basis for treatment or other patient management decisions.  A negative result may occur with improper specimen collection / handling, submission of specimen other than nasopharyngeal swab, presence of viral mutation(s) within the areas targeted by this assay, and inadequate number of viral copies (<250 copies / mL). A negative result must be combined with clinical observations, patient history, and epidemiological information.  Fact Sheet  for Patients:   StrictlyIdeas.no  Fact Sheet for Healthcare Providers: BankingDealers.co.za  This test is not yet approved or  cleared by the [place] FDA and has been authorized for detection and/or diagnosis of SARS-CoV-2 by FDA under an Emergency Use Authorization (EUA).  This EUA will remain in effect (meaning this test can be used) for the duration of the COVID-19 declaration under Section 564(b)(1) of the Act, 21 U.S.C. section  360bbb-3(b)(1), unless the authorization is terminated or revoked sooner.  Performed at [facility], [place] [address] [place], [place] [address]   Blood culture (routine x 2)     Status: Abnormal   Collection Time: 06/01/20 12:30 PM   Specimen: BLOOD  Result Value Ref Range Status   Specimen Description BLOOD SITE NOT SPECIFIED  Final   Special Requests   Final    BOTTLES DRAWN AEROBIC AND ANAEROBIC Blood Culture adequate volume   Culture  Setup Time   Final    AEROBIC BOTTLE ONLY GRAM NEGATIVE RODS CRITICAL VALUE NOTED.  VALUE IS CONSISTENT WITH PREVIOUSLY REPORTED AND CALLED VALUE.    Culture (A)  Final    PROTEUS MIRABILIS SUSCEPTIBILITIES PERFORMED ON PREVIOUS CULTURE WITHIN THE LAST 5 DAYS. Performed at [facility], [place] [address], [place], [place] [address]    Report Status 06/05/2020 FINAL  Final  Blood culture (routine x 2)     Status: Abnormal   Collection Time: 06/01/20 12:35 PM   Specimen: BLOOD  Result Value Ref Range Status   Specimen Description BLOOD SITE NOT SPECIFIED  Final   Special Requests   Final    BOTTLES DRAWN AEROBIC AND ANAEROBIC Blood Culture adequate volume   Culture  Setup Time   Final    GRAM NEGATIVE RODS AEROBIC BOTTLE ONLY CRITICAL RESULT CALLED TO, READ BACK BY AND VERIFIED WITH: PHARMD E.FULOIPEN AT 8242 ON 06/02/2020 BY T.SAAD Performed at [facility], [place] [address], [place], [place] [address]    Culture PROTEUS MIRABILIS (A)  Final   Report Status 06/04/2020 FINAL  Final   Organism ID, Bacteria PROTEUS MIRABILIS  Final      Susceptibility   Proteus mirabilis - MIC*    AMPICILLIN <=2 SENSITIVE Sensitive     CEFAZOLIN <=4 SENSITIVE Sensitive     CEFEPIME <=0.12 SENSITIVE Sensitive     CEFTAZIDIME <=1 SENSITIVE Sensitive     CEFTRIAXONE <=0.25 SENSITIVE Sensitive     CIPROFLOXACIN <=0.25 SENSITIVE Sensitive     GENTAMICIN <=1 SENSITIVE Sensitive     IMIPENEM 4 SENSITIVE Sensitive     TRIMETH/SULFA >=320 RESISTANT  Resistant     AMPICILLIN/SULBACTAM <=2 SENSITIVE Sensitive     PIP/TAZO <=4 SENSITIVE Sensitive     * PROTEUS MIRABILIS  Blood Culture ID Panel (Reflexed)     Status: Abnormal   Collection Time: 06/01/20 12:35 PM  Result Value Ref Range Status   Enterococcus faecalis NOT DETECTED NOT DETECTED Final   Enterococcus Faecium NOT DETECTED NOT DETECTED Final   Listeria monocytogenes NOT DETECTED NOT DETECTED Final   Staphylococcus species NOT DETECTED NOT DETECTED Final   Staphylococcus aureus (BCID) NOT DETECTED NOT DETECTED Final   Staphylococcus epidermidis NOT DETECTED NOT DETECTED Final   Staphylococcus lugdunensis NOT DETECTED NOT DETECTED Final   Streptococcus species NOT DETECTED NOT DETECTED Final   Streptococcus agalactiae NOT DETECTED NOT DETECTED Final   Streptococcus pneumoniae NOT DETECTED NOT DETECTED Final   Streptococcus pyogenes NOT DETECTED NOT DETECTED Final   A.calcoaceticus-baumannii  NOT DETECTED NOT DETECTED Final   Bacteroides fragilis NOT DETECTED NOT DETECTED Final   Enterobacterales DETECTED (A) NOT DETECTED Final    Comment: Enterobacterales represent a large order of gram negative bacteria, not a single organism. CRITICAL RESULT CALLED TO, READ BACK BY AND VERIFIED WITH: PHARMD E.FULOIPEN AT 1205 ON 06/02/2020 BY T.SAAD    Enterobacter cloacae complex NOT DETECTED NOT DETECTED Final   Escherichia coli NOT DETECTED NOT DETECTED Final   Klebsiella aerogenes NOT DETECTED NOT DETECTED Final   Klebsiella oxytoca NOT DETECTED NOT DETECTED Final   Klebsiella pneumoniae NOT DETECTED NOT DETECTED Final   Proteus species DETECTED (A) NOT DETECTED Final    Comment: CRITICAL RESULT CALLED TO, READ BACK BY AND VERIFIED WITH: PHARMD E.FULOIPEN AT 1205 ON 06/02/2020 BY T.SAAD    Salmonella species NOT DETECTED NOT DETECTED Final   Serratia marcescens NOT DETECTED NOT DETECTED Final   Haemophilus influenzae NOT DETECTED NOT DETECTED Final   Neisseria meningitidis NOT  DETECTED NOT DETECTED Final   Pseudomonas aeruginosa NOT DETECTED NOT DETECTED Final   Stenotrophomonas maltophilia NOT DETECTED NOT DETECTED Final   Candida albicans NOT DETECTED NOT DETECTED Final   Candida auris NOT DETECTED NOT DETECTED Final   Candida glabrata NOT DETECTED NOT DETECTED Final   Candida krusei NOT DETECTED NOT DETECTED Final   Candida parapsilosis NOT DETECTED NOT DETECTED Final   Candida tropicalis NOT DETECTED NOT DETECTED Final   Cryptococcus neoformans/gattii NOT DETECTED NOT DETECTED Final   CTX-M ESBL NOT DETECTED NOT DETECTED Final   Carbapenem resistance IMP NOT DETECTED NOT DETECTED Final   Carbapenem resistance KPC NOT DETECTED NOT DETECTED Final   Carbapenem resistance NDM NOT DETECTED NOT DETECTED Final   Carbapenem resist OXA 48 LIKE NOT DETECTED NOT DETECTED Final   Carbapenem resistance VIM NOT DETECTED NOT DETECTED Final    Comment: Performed at [facility], [address], [place], [place] [address]  Surgical pcr screen     Status: Abnormal   Collection Time: 06/04/20  9:13 AM   Specimen: Nasal Mucosa; Nasal Swab  Result Value Ref Range Status   MRSA, PCR NEGATIVE NEGATIVE Final   Staphylococcus aureus POSITIVE (A) NEGATIVE Final    Comment: (NOTE) The Xpert SA Assay (FDA approved for NASAL specimens in patients 6 years of age and older), is one component of a comprehensive surveillance program. It is not intended to diagnose infection nor to guide or monitor treatment. Performed at [facility], [place] [address] [place], [place] [address]   Aerobic/Anaerobic Culture (surgical/deep wound)     Status: None (Preliminary result)   Collection Time: 06/04/20  1:43 PM   Specimen: Soft Tissue, Other  Result Value Ref Range Status   Specimen Description WOUND  Final   Special Requests SACRAL  Final   Gram Stain   Final    NO WBC SEEN NO ORGANISMS SEEN Performed at [facility], [address], [place], [place] [address]     Culture   Final    FEW PROTEUS MIRABILIS FEW ENTEROCOCCUS FAECALIS NO ANAEROBES ISOLATED; CULTURE IN PROGRESS FOR 5 DAYS    Report Status PENDING  Incomplete   Organism ID, Bacteria PROTEUS MIRABILIS  Final   Organism ID, Bacteria ENTEROCOCCUS FAECALIS  Final      Susceptibility   Enterococcus faecalis - MIC*    AMPICILLIN <=2 SENSITIVE Sensitive     VANCOMYCIN 1 SENSITIVE Sensitive     GENTAMICIN SYNERGY SENSITIVE Sensitive     *  FEW ENTEROCOCCUS FAECALIS   Proteus mirabilis - MIC*    AMPICILLIN <=2 SENSITIVE Sensitive     CEFAZOLIN <=4 SENSITIVE Sensitive     CEFEPIME <=0.12 SENSITIVE Sensitive     CEFTAZIDIME <=1 SENSITIVE Sensitive     CEFTRIAXONE <=0.25 SENSITIVE Sensitive     CIPROFLOXACIN <=0.25 SENSITIVE Sensitive     GENTAMICIN <=1 SENSITIVE Sensitive     IMIPENEM 2 SENSITIVE Sensitive     TRIMETH/SULFA >=320 RESISTANT Resistant     AMPICILLIN/SULBACTAM <=2 SENSITIVE Sensitive     PIP/TAZO <=4 SENSITIVE Sensitive     * FEW PROTEUS MIRABILIS        Radiology Studies: [place] EKG SITE RITE  Result Date: 06/08/2020 If Site Rite image not attached, placement could not be confirmed due to current cardiac rhythm.       Scheduled Meds: . antiseptic oral rinse  10 mL Mouth Rinse BID  . vitamin C  500 mg Per Tube Daily  . aspirin  81 mg Per Tube Daily  . atorvastatin  20 mg Per Tube Daily  . Chlorhexidine Gluconate Cloth  6 each Topical Daily  . Chlorhexidine Gluconate Cloth  6 each Topical Q0600  . cholestyramine  4 g Per Tube Daily  . diclofenac Sodium  2 g Topical BID  . enoxaparin (LOVENOX) injection  40 mg Subcutaneous Q24H  . feeding supplement (PROSource TF)  90 mL Per Tube BID  . fluconazole  100 mg Per Tube Daily  . fluticasone  2 spray Each Nare BID  . gabapentin  100 mg Per Tube Daily   And  . gabapentin  300 mg Per Tube QHS  . icosapent Ethyl  2 g Oral BID  . insulin aspart  0-15 Units Subcutaneous Q4H  . insulin glargine  15 Units Subcutaneous QHS    . lidocaine  1 patch Transdermal Q24H  . multivitamin with minerals  1 tablet Per Tube Daily  . mupirocin ointment  1 application Nasal BID  . Muscle Rub  1 application Topical BID  . nutrition supplement (JUVEN)  1 packet Per Tube BID BM  . ondansetron  4 mg Oral BID  . oxybutynin  5 mg Per Tube Daily  . pantoprazole sodium  40 mg Per Tube Daily  . saccharomyces boulardii  250 mg Per Tube BID  . sertraline  150 mg Per Tube QHS  . tamsulosin  0.4 mg Oral QPC supper  . vitamin B-12  500 mcg Oral Daily  . zinc sulfate  220 mg Per Tube Daily   Continuous Infusions: . ampicillin-sulbactam (UNASYN) IV 3 g (06/08/20 0931)  . feeding supplement (OSMOLITE 1.5 CAL) 1,000 mL (06/07/20 1803)     LOS: 7 days     Cordelia Poche, MD [facility] 06/08/2020, 12:51 PM  If 7PM-7AM, please contact night-coverage [email or web address]

## 2020-06-08 NOTE — Progress Notes (Signed)
[facility] Progress Note  4 Days Post-Op  Subjective: No complaints. Tolerating PO intake and TF. Having colostomy output.   Objective: Vital signs in last 24 hours: Temp:  [98.4 F (36.9 C)-99.2 F (37.3 C)] 98.5 F (36.9 C) (09/20 0343) Pulse Rate:  [73-80] 80 (09/19 2028) Resp:  [17-18] 18 (09/20 0343) BP: (126-139)/(53-63) 132/53 (09/20 0343) SpO2:  [97 %-98 %] 97 % (09/20 0343) Last BM Date: 06/07/20  Intake/Output from previous day: 09/19 0701 - 09/20 0700 In: 360 [P.O.:360] Out: 1700 [Urine:1500; Stool:200] Intake/Output this shift: Total I/O In: 120 [P.O.:120] Out: -   PE: General: pleasant, WD, chronically ill appearing male who is laying in bed in NAD Heart: regular, rate, and rhythm. Palpable pedal pulses bilaterally Lungs: Respiratory effort nonlabored Abd: soft, NT, ND, +BS, loop colostomy with some liquid stool output, gastrostomy tube present without any drainage around  GU: sacral wound with some bloody drainage but no purulence noted, exposed sacral bone in wound bed    Lab Results:  Recent Labs    06/05/20 1200  WBC 5.3  HGB 8.3*  HCT 26.8*  PLT 297   BMET Recent Labs    06/07/20 0344 06/08/20 0500  NA 139 135  K 4.4 4.4  CL 107 102  CO2 25 25  GLUCOSE 205* 178*  BUN 8 7*  CREATININE <0.30* 0.34*  CALCIUM 8.2* 8.2*   PT/INR No results for input(s): LABPROT, INR in the last 72 hours. CMP     Component Value Date/Time   NA 135 06/08/2020 0500   NA 140 04/11/2019 1028   K 4.4 06/08/2020 0500   CL 102 06/08/2020 0500   CO2 25 06/08/2020 0500   GLUCOSE 178 (H) 06/08/2020 0500   BUN 7 (L) 06/08/2020 0500   BUN 21 04/11/2019 1028   CREATININE 0.34 (L) 06/08/2020 0500   CREATININE 0.87 04/15/2016 1133   CALCIUM 8.2 (L) 06/08/2020 0500   PROT 5.3 (L) 06/01/2020 1248   PROT 6.6 01/20/2020 0754   ALBUMIN 1.5 (L) 06/01/2020 1248   ALBUMIN 4.2 03/10/2020 1757   AST 62 (H) 06/01/2020 1248   ALT 68 (H) 06/01/2020 1248    ALKPHOS 134 (H) 06/01/2020 1248   BILITOT 0.7 06/01/2020 1248   BILITOT 0.3 01/20/2020 0754   GFRNONAA >60 06/08/2020 0500   GFRNONAA 87 04/15/2016 1133   GFRAA >60 06/08/2020 0500   GFRAA >89 04/15/2016 1133   Lipase     Component Value Date/Time   LIPASE 29 03/08/2020 1142       Studies/Results: No results found.  Anti-infectives: Anti-infectives (From admission, onward)   Start     Dose/Rate Route Frequency Ordered Stop   06/08/20 1000  fluconazole (DIFLUCAN) tablet 100 mg        100 mg Per Tube Daily 06/07/20 1123 06/16/20 0959   06/05/20 2200  Ampicillin-Sulbactam (UNASYN) 3 g in sodium chloride 0.9 % 100 mL IVPB        3 g 200 mL/hr over 30 Minutes Intravenous Every 6 hours 06/05/20 1522     06/02/20 1000  fluconazole (DIFLUCAN) tablet 100 mg  Status:  Discontinued        100 mg Oral Daily 06/01/20 1815 06/07/20 1123   06/02/20 0200  vancomycin (VANCOCIN) IVPB 1000 mg/200 mL premix  Status:  Discontinued        1,000 mg 200 mL/hr over 60 Minutes Intravenous Every 12 hours 06/01/20 1739 06/05/20 1348   06/01/20 2200  ceFEPIme (MAXIPIME) 2 g  in sodium chloride 0.9 % 100 mL IVPB  Status:  Discontinued        2 g 200 mL/hr over 30 Minutes Intravenous Every 8 hours 06/01/20 1418 06/05/20 1453   06/01/20 1830  fluconazole (DIFLUCAN) tablet 200 mg        200 mg Oral  Once 06/01/20 1815 06/02/20 1701   06/01/20 1700  metroNIDAZOLE (FLAGYL) IVPB 500 mg  Status:  Discontinued        500 mg 100 mL/hr over 60 Minutes Intravenous Every 8 hours 06/01/20 1534 06/05/20 1348   06/01/20 1245  ceFEPIme (MAXIPIME) 2 g in sodium chloride 0.9 % 100 mL IVPB        2 g 200 mL/hr over 30 Minutes Intravenous  Once 06/01/20 1233 06/01/20 1330   06/01/20 1230  vancomycin (VANCOREADY) IVPB 2000 mg/400 mL        2,000 mg 200 mL/hr over 120 Minutes Intravenous  Once 06/01/20 1229 06/01/20 1729       Assessment/Plan HTN HLD Transverse myelitis with paraplegia  Thoracic  discitis FTT Hypokalemia, resolved  Dysphagia  POD4, S/p gastrostomy tube placement 9/16 Dr. Bobbye Morton - tolerating TF, advance to goal as tolerated - ok to use for meds as well Sacral wound with osteomyelitis,POD 4, s/p debridement Wound is clean and looks good.  Continue WD dressing changes  POD 4, S/p I&D and loop diverting colostomy 9/16 Dr. Bobbye Morton - [place] consult for new stoma - routine colostomy care.   FEN: D3 diet, TF at 60 cc/h VTE: lovenox ID: flagyl/cefepime/vanc/diflucan  Patient is stable for discharge from a surgical standpoint. Follow up in AVS and recommend referral to the wound care center. General surgery will sign off at this time, please call if we can be of further assistance.   LOS: 7 days    Norm Parcel , [facility] 06/08/2020, 9:11 AM Please see Amion for pager number during day hours 7:00am-4:30pm

## 2020-06-08 NOTE — Progress Notes (Signed)
   06/08/20 1609  Clinical Encounter Type  Visited With Patient  Visit Type Follow-up;Spiritual support;Social support  Referral From Chaplain;Palliative care team  Consult/Referral To Chaplain  Spiritual Encounters  Spiritual Needs Prayer  This chaplain followed up with Pt. spiritual care.  The Pt. is more elevated in the bed today and able to carry on a conversation with the chaplain.  The chaplain learned the Pt. is looking forward to discharge and gaining strength. Th Pt. is respectful of what his wife-Jean may or may not be able to do at home as a determining factor for rehab placement.  The Pt talks freely of his roles at [facility] and easily transitions into prayer with the chaplain.  The Pt. is willing to continue to talk about rehab the next time the chaplain visits.

## 2020-06-08 NOTE — Progress Notes (Signed)
PICC order discussed with Barbie, RN. No discharge date yet. Pt has central line. Informed RN line will likely be placed 9/21. Please update PICC Team when discharge date is known.

## 2020-06-08 NOTE — Consult Note (Signed)
[place] Nurse ostomy follow up Patient receiving care in [facility] 6N10. Stoma type/location: LLQ colostomy Stomal assessment/size: deferred Peristomal assessment: deferred Treatment options for stomal/peristomal skin: convex pouch and barrier ring--supplies not in room Output: liquid brown  Ostomy pouching: 2pc. Flat pouching system in place, no sign of washout, no leakage. Patient states it was placed this morning. Education provided: none Enrolled patient in [place] Start Discharge program: No I will request [place] order the following supplies:  Convex one piece Roselee Culver [phone] and barrier rings, Kellie Simmering #[id number] Val Riles, RN, MSN, [facility], CNS-BC, pager [phone]

## 2020-06-09 LAB — GLUCOSE, CAPILLARY
Glucose-Capillary: 169 mg/dL — ABNORMAL HIGH (ref 70–99)
Glucose-Capillary: 172 mg/dL — ABNORMAL HIGH (ref 70–99)
Glucose-Capillary: 178 mg/dL — ABNORMAL HIGH (ref 70–99)
Glucose-Capillary: 180 mg/dL — ABNORMAL HIGH (ref 70–99)
Glucose-Capillary: 191 mg/dL — ABNORMAL HIGH (ref 70–99)
Glucose-Capillary: 199 mg/dL — ABNORMAL HIGH (ref 70–99)
Glucose-Capillary: 201 mg/dL — ABNORMAL HIGH (ref 70–99)

## 2020-06-09 LAB — BASIC METABOLIC PANEL
Anion gap: 6 (ref 5–15)
BUN: 7 mg/dL — ABNORMAL LOW (ref 8–23)
CO2: 27 mmol/L (ref 22–32)
Calcium: 8.4 mg/dL — ABNORMAL LOW (ref 8.9–10.3)
Chloride: 100 mmol/L (ref 98–111)
Creatinine, Ser: 0.34 mg/dL — ABNORMAL LOW (ref 0.61–1.24)
GFR calc Af Amer: >60 mL/min (ref >60)
GFR calc non Af Amer: >60 mL/min (ref >60)
Glucose, Bld: 182 mg/dL — ABNORMAL HIGH (ref 70–99)
Potassium: 4.4 mmol/L (ref 3.5–5.1)
Sodium: 133 mmol/L — ABNORMAL LOW (ref 135–145)

## 2020-06-09 LAB — AEROBIC/ANAEROBIC CULTURE W GRAM STAIN (SURGICAL/DEEP WOUND): Gram Stain: NONE SEEN

## 2020-06-09 LAB — MAGNESIUM: Magnesium: 1.7 mg/dL (ref 1.7–2.4)

## 2020-06-09 LAB — PHOSPHORUS: Phosphorus: 3.2 mg/dL (ref 2.5–4.6)

## 2020-06-09 LAB — SARS CORONAVIRUS 2 (TAT 6-24 HRS): SARS Coronavirus 2: NEGATIVE

## 2020-06-09 MED ORDER — SODIUM CHLORIDE 0.9% FLUSH: 10.0000 mL | INTRAVENOUS | Status: DC | PRN

## 2020-06-09 MED ORDER — FREE WATER
150.0000 mL | Status: DC
  Administered 2020-06-09 – 2020-06-10 (×7): 150 mL

## 2020-06-09 MED ORDER — SODIUM CHLORIDE 0.9% FLUSH: 10.0000 mL | Freq: Two times a day (BID) | INTRAVENOUS | Status: DC

## 2020-06-09 NOTE — Progress Notes (Signed)
Physical Therapy Treatment Patient Details Name: Brent [name]. MRN: [id number] DOB: 01/13/1946 Today's Date: 06/09/2020    History of Present Illness 74 y.o. male  with past medical history of recently diagnosed transverse myelitis and paraplegia secondary to transverse myelitis returns to ED on 06/01/2020 with difficulty eating, failure to thrive, and wounds on sacrum and B feet. Was on CIR 7/7-8/6 and then to [facility]. Other PMH: OSA, HTN, CAD, DM.    PT Comments    Pt supine in bed on arrival.  Able to move to edge of bed this session and more recepetive to participate. Worked on balance and sitting tolerance at edge of bed.  Continue to recommend SNF placement at this time.      Follow Up Recommendations  SNF;Supervision/Assistance - 24 hour     Equipment Recommendations  None recommended by PT    Recommendations for Other Services       Precautions / Restrictions Precautions Precautions: Fall Required Braces or Orthoses: Other Brace Other Brace: B prevalon to float B Heel (wounds on B heels) Restrictions Weight Bearing Restrictions: No    Mobility  Bed Mobility Overal bed mobility: Needs Assistance       Supine to sit: Max assist;Total assist Sit to supine: Total assist   General bed mobility comments: Total assistance to move LEs to edge of bed and max assistance to rise into a seated position.  Noted with posterior and R lateral leam.  Focused on propping to R side and weight shifting forward.  Required total assistance to mve back to bed with +2 assistance to boost to [facility].  Transfers   Equipment used:  (unable.)                Ambulation/Gait                 Stairs             Wheelchair Mobility    Modified Rankin (Stroke Patients Only)       Balance     Sitting balance-Leahy Scale: Poor Sitting balance - Comments: max assistance to maintain balance.                                    Cognition  Arousal/Alertness: Awake/alert Behavior During Therapy: Flat affect Overall Cognitive Status: No family/caregiver present to determine baseline cognitive functioning Area of Impairment: Following commands;Safety/judgement;Awareness;Memory;Problem solving                     Memory: Decreased short-term memory Following Commands: Follows one step commands with increased time;Follows one step commands inconsistently     Problem Solving: Slow processing;Decreased initiation;Requires verbal cues        Exercises      General Comments        Pertinent Vitals/Pain Pain Assessment: Faces Faces Pain Scale: No hurt Pain Intervention(s): Monitored during session    Home Living                      Prior Function            PT Goals (current goals can now be found in the care plan section) Acute Rehab PT Goals Patient Stated Goal: none stated Potential to Achieve Goals: Fair Progress towards PT goals: Progressing toward goals    Frequency    Min 2X/week      PT Plan  Current plan remains appropriate    Co-evaluation              AM-PAC PT "6 Clicks" Mobility   Outcome Measure  Help needed turning from your back to your side while in a flat bed without using bedrails?: A Lot Help needed moving from lying on your back to sitting on the side of a flat bed without using bedrails?: A Lot Help needed moving to and from a bed to a chair (including a wheelchair)?: Total Help needed standing up from a chair using your arms (e.g., wheelchair or bedside chair)?: Total Help needed to walk in hospital room?: Total Help needed climbing 3-5 steps with a railing? : Total 6 Click Score: 8    End of Session   Activity Tolerance: Patient tolerated treatment well Patient left: in bed;with call bell/phone within reach;with bed alarm set (placed on chair position) Nurse Communication: Mobility status PT Visit Diagnosis: Muscle weakness (generalized)  (M62.81);Dizziness and giddiness (R42)     Time: 7371-0626 PT Time Calculation (min) (ACUTE ONLY): 23 min  Charges:  $Therapeutic Activity: 23-37 mins                     Erasmo Leventhal , PTA Acute Rehabilitation Services Pager [phone] Office [phone]     Dashanti Burr Eli Hose 06/09/2020, 6:13 PM

## 2020-06-09 NOTE — Progress Notes (Addendum)
PROGRESS NOTE    Brent [name].  ION:[id number] DOB: Apr 26, 1946 DOA: 06/01/2020 PCP: [name], [name], MD   Brief Narrative: Brent Laurel. is a 74 y.o. male with a history of transverse myelitis status post rituximab, paraplegia secondary to transverse myelitis, hypertension, hyperlipidemia, insulin-dependent diabetes mellitus, CAD.  Patient presented secondary to trouble eating and failure to thrive and was found to have a left gluteal abscess in addition to discitis/osteomyelitis on imaging.  Patient started empirically on antibiotics for management in addition to general surgery consult for debridement of sacral wound/abscess.  Patient started empirically on IV antibiotics.   Assessment & Plan:   Principal Problem:   Abscess, gluteal, left Active Problems:   Hyperlipidemia   Essential hypertension   Transverse myelitis (HCC)   Sacral ulcer, with necrosis of muscle (HCC)   FTT (failure to thrive) in adult   Palliative care by specialist   Goals of care, counseling/discussion   Lumbar discitis   Malnutrition of moderate degree   Psoas abscess, right (HCC)   Gram-negative bacteremia   Left gluteal abscess/necrotic sacral wound L4-L5 discitis/osteomyelitis Proteus mirabilis bacteremia General surgery consulted for debridement.  Blood cultures significant for proteus mirabilis in 1 out of 4 samples.  Unlikely that this is a contaminant in setting of gluteal abscess and discitis. Patient is s/p diverting loop colostomy and debridement on 9/16. Rectal tube discontinued. -Infectious disease recommendations: Unasyn IV, End date of 07/17/2020 -General surgery recommendations: tube feeds, dysphagia 3 diet, BID dressing changes -Insert PICC line (was not placed on 9/20 -Discontinue central line  History of transverse myelitis with paraplegia Plan for SNF -PT/OT  Dysphagia Esophagram performed on 9/14 without evidence of esophageal dysmotility. Likely oral  phase disability. Speech therapy consulted. Complicated by Xerostomia. GI consulted who recommends ENT consult. Oral cavity appears moist on exam, however. Patient is tolerating oral diet better. -SLP recommendations: Dysphagia 3 diet -Tube feeds -ENT outpatient follow-up; will call inpatient per wife's request (discussed with Dr. Constance Holster who recommended outpatient follow-up)  Failure to thrive In setting of above. G-tube placed per general surgery. Tube feeds at goal rate. Electrolytes improved. -Tube feeds -Daily BMP, Magnesium, Phosphorus  Abdominal nodule Unknown etiology. Patient states he has had it for some time. Otherwise unremarkable.  Hypokalemia Supplementation. Worse in setting of hypomagnesemia. Also will be complicated by starting tube feeds. Improved with supplementation -BMP as above  Hypomagnesemia -IV magnesium repletion as needed  Diabetes mellitus, type II Hemoglobin A1c of 6.6%. -Continue sliding scale insulin  Hyperlipidemia -Continue atorvastatin  Essential hypertension On lisinopril as an outpatient which was held on admission.  Blood pressure has been mostly controlled while inpatient.  Confusion Intermittent. Unsure of etiology. Folate, B12, TSH normal. Resolved.  Neuromyelitis optica Follows with neurology. Neurology has requested Hepatitis B/C/TB testing.  Pressure injury Sacral, left anterior ankle, right anterior ankle, right heel, left heel.  All present on admission   DVT prophylaxis: Lovenox Code Status:   Code Status: Full Code Family Communication: Wife on telephone (27 minutes) Disposition Plan: Discharge likely to SNF in 1 day pending bed availability. COVID-19 testing ordered 9/21.   Consultants:   General surgery  Gastroenterology  Infectious disease  Procedures:   Debridement/diverting loop colostomy/Gastrostomy tube placement  Antimicrobials:  Vancomycin  Cefepime  Flagyl  Unasyn  Subjective: No concerns  today.  Objective: Vitals:   06/08/20 1327 06/08/20 2012 06/09/20 0359 06/09/20 0401  BP: 131/62 (!) 115/52  (!) 112/50  Pulse: 79 83  73  Resp: 18 17  17   Temp: 98.2 F (36.8 C) 98.7 F (37.1 C)  97.7 F (36.5 C)  TempSrc: Oral Oral  Oral  SpO2: 98% 97%  96%  Weight:   123.4 kg   Height:        Intake/Output Summary (Last 24 hours) at 06/09/2020 1342 Last data filed at 06/09/2020 0358 Gross per 24 hour  Intake 340 ml  Output 3225 ml  Net -2885 ml   Filed Weights   06/05/20 0403 06/06/20 0500 06/09/20 0359  Weight: 120.2 kg 120.2 kg 123.4 kg    Examination:  General exam: Appears calm and comfortable Respiratory system: Clear to auscultation. Respiratory effort normal. Cardiovascular system: S1 & S2 heard, RRR. No murmurs, rubs, gallops or clicks. Gastrointestinal system: Abdomen is slightly distended, soft and nontender. Ostomy bag with watery stool and some gas. Normal bowel sounds heard. Central nervous system: Alert and oriented. No focal neurological deficits. Musculoskeletal: No calf tenderness Skin: No cyanosis. No rashes Psychiatry: Judgement and insight appear normal. Mood & affect appropriate.     Data Reviewed: I have personally reviewed following labs and imaging studies  CBC Lab Results  Component Value Date   WBC 5.3 06/05/2020   RBC 3.26 (L) 06/05/2020   HGB 8.3 (L) 06/05/2020   HCT 26.8 (L) 06/05/2020   MCV 82.2 06/05/2020   MCH 25.5 (L) 06/05/2020   PLT 297 06/05/2020   MCHC 31.0 06/05/2020   RDW 14.0 06/05/2020   LYMPHSABS 0.6 (L) 06/02/2020   MONOABS 0.4 06/02/2020   EOSABS 0.0 06/02/2020   BASOSABS 0.0 24/26/8341     Last metabolic panel Lab Results  Component Value Date   NA 133 (L) 06/09/2020   K 4.4 06/09/2020   CL 100 06/09/2020   CO2 27 06/09/2020   BUN 7 (L) 06/09/2020   CREATININE 0.34 (L) 06/09/2020   GLUCOSE 182 (H) 06/09/2020   GFRNONAA >60 06/09/2020   GFRAA >60 06/09/2020   CALCIUM 8.4 (L) 06/09/2020   PHOS 3.2  06/09/2020   PROT 5.3 (L) 06/01/2020   ALBUMIN 1.5 (L) 06/01/2020   LABGLOB 2.0 04/11/2019   AGRATIO 2.3 (H) 04/11/2019   BILITOT 0.7 06/01/2020   ALKPHOS 134 (H) 06/01/2020   AST 62 (H) 06/01/2020   ALT 68 (H) 06/01/2020   ANIONGAP 6 06/09/2020    CBG (last 3)  Recent Labs    06/09/20 0522 06/09/20 0755 06/09/20 1217  GLUCAP 180* 191* 201*     GFR: Estimated Creatinine Clearance: 108.2 mL/min (A) (by C-G formula based on SCr of 0.34 mg/dL (L)).  Coagulation Profile: No results for input(s): INR, PROTIME in the last 168 hours.  Recent Results (from the past 240 hour(s))  SARS Coronavirus 2 by RT PCR (hospital order, performed in [facility] hospital lab) Nasopharyngeal Nasopharyngeal Swab     Status: None   Collection Time: 06/01/20 12:24 PM   Specimen: Nasopharyngeal Swab  Result Value Ref Range Status   SARS Coronavirus 2 NEGATIVE NEGATIVE Final    Comment: (NOTE) SARS-CoV-2 target nucleic acids are NOT DETECTED.  The SARS-CoV-2 RNA is generally detectable in upper and lower respiratory specimens during the acute phase of infection. The lowest concentration of SARS-CoV-2 viral copies this assay can detect is 250 copies / mL. A negative result does not preclude SARS-CoV-2 infection and should not be used as the sole basis for treatment or other patient management decisions.  A negative result may occur with improper specimen collection / handling, submission of specimen  other than nasopharyngeal swab, presence of viral mutation(s) within the areas targeted by this assay, and inadequate number of viral copies (<250 copies / mL). A negative result must be combined with clinical observations, patient history, and epidemiological information.  Fact Sheet for Patients:   StrictlyIdeas.no  Fact Sheet for Healthcare Providers: BankingDealers.co.za  This test is not yet approved or  cleared by the [place] FDA and has  been authorized for detection and/or diagnosis of SARS-CoV-2 by FDA under an Emergency Use Authorization (EUA).  This EUA will remain in effect (meaning this test can be used) for the duration of the COVID-19 declaration under Section 564(b)(1) of the Act, 21 U.S.C. section 360bbb-3(b)(1), unless the authorization is terminated or revoked sooner.  Performed at [facility], [place] [address] [place], [place] [address]   Blood culture (routine x 2)     Status: Abnormal   Collection Time: 06/01/20 12:30 PM   Specimen: BLOOD  Result Value Ref Range Status   Specimen Description BLOOD SITE NOT SPECIFIED  Final   Special Requests   Final    BOTTLES DRAWN AEROBIC AND ANAEROBIC Blood Culture adequate volume   Culture  Setup Time   Final    AEROBIC BOTTLE ONLY GRAM NEGATIVE RODS CRITICAL VALUE NOTED.  VALUE IS CONSISTENT WITH PREVIOUSLY REPORTED AND CALLED VALUE.    Culture (A)  Final    PROTEUS MIRABILIS SUSCEPTIBILITIES PERFORMED ON PREVIOUS CULTURE WITHIN THE LAST 5 DAYS. Performed at [facility], [place] [address], [place], [place] [address]    Report Status 06/05/2020 FINAL  Final  Blood culture (routine x 2)     Status: Abnormal   Collection Time: 06/01/20 12:35 PM   Specimen: BLOOD  Result Value Ref Range Status   Specimen Description BLOOD SITE NOT SPECIFIED  Final   Special Requests   Final    BOTTLES DRAWN AEROBIC AND ANAEROBIC Blood Culture adequate volume   Culture  Setup Time   Final    GRAM NEGATIVE RODS AEROBIC BOTTLE ONLY CRITICAL RESULT CALLED TO, READ BACK BY AND VERIFIED WITH: PHARMD E.FULOIPEN AT 4193 ON 06/02/2020 BY T.SAAD Performed at [facility], [place] [address], [place], [place] [address]    Culture PROTEUS MIRABILIS (A)  Final   Report Status 06/04/2020 FINAL  Final   Organism ID, Bacteria PROTEUS MIRABILIS  Final      Susceptibility   Proteus mirabilis - MIC*    AMPICILLIN <=2 SENSITIVE Sensitive     CEFAZOLIN <=4 SENSITIVE Sensitive      CEFEPIME <=0.12 SENSITIVE Sensitive     CEFTAZIDIME <=1 SENSITIVE Sensitive     CEFTRIAXONE <=0.25 SENSITIVE Sensitive     CIPROFLOXACIN <=0.25 SENSITIVE Sensitive     GENTAMICIN <=1 SENSITIVE Sensitive     IMIPENEM 4 SENSITIVE Sensitive     TRIMETH/SULFA >=320 RESISTANT Resistant     AMPICILLIN/SULBACTAM <=2 SENSITIVE Sensitive     PIP/TAZO <=4 SENSITIVE Sensitive     * PROTEUS MIRABILIS  Blood Culture ID Panel (Reflexed)     Status: Abnormal   Collection Time: 06/01/20 12:35 PM  Result Value Ref Range Status   Enterococcus faecalis NOT DETECTED NOT DETECTED Final   Enterococcus Faecium NOT DETECTED NOT DETECTED Final   Listeria monocytogenes NOT DETECTED NOT DETECTED Final   Staphylococcus species NOT DETECTED NOT DETECTED Final   Staphylococcus aureus (BCID) NOT DETECTED NOT DETECTED Final   Staphylococcus epidermidis NOT DETECTED NOT DETECTED Final   Staphylococcus lugdunensis NOT DETECTED  NOT DETECTED Final   Streptococcus species NOT DETECTED NOT DETECTED Final   Streptococcus agalactiae NOT DETECTED NOT DETECTED Final   Streptococcus pneumoniae NOT DETECTED NOT DETECTED Final   Streptococcus pyogenes NOT DETECTED NOT DETECTED Final   A.calcoaceticus-baumannii NOT DETECTED NOT DETECTED Final   Bacteroides fragilis NOT DETECTED NOT DETECTED Final   Enterobacterales DETECTED (A) NOT DETECTED Final    Comment: Enterobacterales represent a large order of gram negative bacteria, not a single organism. CRITICAL RESULT CALLED TO, READ BACK BY AND VERIFIED WITH: PHARMD E.FULOIPEN AT 1205 ON 06/02/2020 BY T.SAAD    Enterobacter cloacae complex NOT DETECTED NOT DETECTED Final   Escherichia coli NOT DETECTED NOT DETECTED Final   Klebsiella aerogenes NOT DETECTED NOT DETECTED Final   Klebsiella oxytoca NOT DETECTED NOT DETECTED Final   Klebsiella pneumoniae NOT DETECTED NOT DETECTED Final   Proteus species DETECTED (A) NOT DETECTED Final    Comment: CRITICAL RESULT CALLED TO, READ BACK  BY AND VERIFIED WITH: PHARMD E.FULOIPEN AT 1205 ON 06/02/2020 BY T.SAAD    Salmonella species NOT DETECTED NOT DETECTED Final   Serratia marcescens NOT DETECTED NOT DETECTED Final   Haemophilus influenzae NOT DETECTED NOT DETECTED Final   Neisseria meningitidis NOT DETECTED NOT DETECTED Final   Pseudomonas aeruginosa NOT DETECTED NOT DETECTED Final   Stenotrophomonas maltophilia NOT DETECTED NOT DETECTED Final   Candida albicans NOT DETECTED NOT DETECTED Final   Candida auris NOT DETECTED NOT DETECTED Final   Candida glabrata NOT DETECTED NOT DETECTED Final   Candida krusei NOT DETECTED NOT DETECTED Final   Candida parapsilosis NOT DETECTED NOT DETECTED Final   Candida tropicalis NOT DETECTED NOT DETECTED Final   Cryptococcus neoformans/gattii NOT DETECTED NOT DETECTED Final   CTX-M ESBL NOT DETECTED NOT DETECTED Final   Carbapenem resistance IMP NOT DETECTED NOT DETECTED Final   Carbapenem resistance KPC NOT DETECTED NOT DETECTED Final   Carbapenem resistance NDM NOT DETECTED NOT DETECTED Final   Carbapenem resist OXA 48 LIKE NOT DETECTED NOT DETECTED Final   Carbapenem resistance VIM NOT DETECTED NOT DETECTED Final    Comment: Performed at [facility], [address] [place], [place] [address]  Surgical pcr screen     Status: Abnormal   Collection Time: 06/04/20  9:13 AM   Specimen: Nasal Mucosa; Nasal Swab  Result Value Ref Range Status   MRSA, PCR NEGATIVE NEGATIVE Final   Staphylococcus aureus POSITIVE (A) NEGATIVE Final    Comment: (NOTE) The Xpert SA Assay (FDA approved for NASAL specimens in patients 3 years of age and older), is one component of a comprehensive surveillance program. It is not intended to diagnose infection nor to guide or monitor treatment. Performed at [facility], [place] [address] [place], [place] [address]   Aerobic/Anaerobic Culture (surgical/deep wound)     Status: None   Collection Time: 06/04/20  1:43 PM   Specimen: Soft Tissue,  Other  Result Value Ref Range Status   Specimen Description WOUND  Final   Special Requests SACRAL  Final   Gram Stain NO WBC SEEN NO ORGANISMS SEEN   Final   Culture   Final    FEW PROTEUS MIRABILIS FEW ENTEROCOCCUS FAECALIS NO ANAEROBES ISOLATED Performed at [facility], [address], [place], [place] [address]    Report Status 06/09/2020 FINAL  Final   Organism ID, Bacteria PROTEUS MIRABILIS  Final   Organism ID, Bacteria ENTEROCOCCUS FAECALIS  Final      Susceptibility  Enterococcus faecalis - MIC*    AMPICILLIN <=2 SENSITIVE Sensitive     VANCOMYCIN 1 SENSITIVE Sensitive     GENTAMICIN SYNERGY SENSITIVE Sensitive     * FEW ENTEROCOCCUS FAECALIS   Proteus mirabilis - MIC*    AMPICILLIN <=2 SENSITIVE Sensitive     CEFAZOLIN <=4 SENSITIVE Sensitive     CEFEPIME <=0.12 SENSITIVE Sensitive     CEFTAZIDIME <=1 SENSITIVE Sensitive     CEFTRIAXONE <=0.25 SENSITIVE Sensitive     CIPROFLOXACIN <=0.25 SENSITIVE Sensitive     GENTAMICIN <=1 SENSITIVE Sensitive     IMIPENEM 2 SENSITIVE Sensitive     TRIMETH/SULFA >=320 RESISTANT Resistant     AMPICILLIN/SULBACTAM <=2 SENSITIVE Sensitive     PIP/TAZO <=4 SENSITIVE Sensitive     * FEW PROTEUS MIRABILIS        Radiology Studies: [place] EKG SITE RITE  Result Date: 06/08/2020 If Site Rite image not attached, placement could not be confirmed due to current cardiac rhythm.       Scheduled Meds: . antiseptic oral rinse  10 mL Mouth Rinse BID  . vitamin C  500 mg Per Tube Daily  . aspirin  81 mg Per Tube Daily  . atorvastatin  20 mg Per Tube Daily  . Chlorhexidine Gluconate Cloth  6 each Topical Daily  . cholestyramine  4 g Per Tube Daily  . diclofenac Sodium  2 g Topical BID  . enoxaparin (LOVENOX) injection  40 mg Subcutaneous Q24H  . feeding supplement (PROSource TF)  90 mL Per Tube BID  . fluconazole  100 mg Per Tube Daily  . fluticasone  2 spray Each Nare BID  . gabapentin  100 mg Per Tube Daily   And  .  gabapentin  300 mg Per Tube QHS  . icosapent Ethyl  2 g Oral BID  . insulin aspart  0-15 Units Subcutaneous Q4H  . insulin glargine  15 Units Subcutaneous QHS  . lidocaine  1 patch Transdermal Q24H  . multivitamin with minerals  1 tablet Per Tube Daily  . Muscle Rub  1 application Topical BID  . nutrition supplement (JUVEN)  1 packet Per Tube BID BM  . ondansetron  4 mg Oral BID  . oxybutynin  5 mg Per Tube Daily  . pantoprazole sodium  40 mg Per Tube Daily  . saccharomyces boulardii  250 mg Per Tube BID  . sertraline  150 mg Per Tube QHS  . tamsulosin  0.4 mg Oral QPC supper  . vitamin B-12  500 mcg Oral Daily  . zinc sulfate  220 mg Per Tube Daily   Continuous Infusions: . ampicillin-sulbactam (UNASYN) IV 3 g (06/09/20 1113)  . feeding supplement (OSMOLITE 1.5 CAL) 1,000 mL (06/09/20 0724)     LOS: 8 days     Cordelia Poche, MD [facility] 06/09/2020, 1:42 PM  If 7PM-7AM, please contact night-coverage [email or web address]

## 2020-06-09 NOTE — Progress Notes (Signed)
Peripherally Inserted Central Catheter Placement  The IV Nurse has discussed with the patient and/or persons authorized to consent for the patient, the purpose of this procedure and the potential benefits and risks involved with this procedure.  The benefits include less needle sticks, lab draws from the catheter, and the patient may be discharged home with the catheter. Risks include, but not limited to, infection, bleeding, blood clot (thrombus formation), and puncture of an artery; nerve damage and irregular heartbeat and possibility to perform a PICC exchange if needed/ordered by physician.  Alternatives to this procedure were also discussed.  Bard Power PICC patient education guide, fact sheet on infection prevention and patient information card has been provided to patient /or left at bedside.    PICC Placement Documentation  PICC Single Lumen 06/09/20 PICC Right Brachial 0 cm 40 cm (Active)  Indication for Insertion or Continuance of Line Home intravenous therapies (PICC only) 06/09/20 1829  Exposed Catheter (cm) 0 cm 06/09/20 1829  Site Assessment Clean;Intact;Dry 06/09/20 1829  Line Status Flushed;Saline locked;Blood return noted 06/09/20 1829  Dressing Type Transparent 06/09/20 1829  Dressing Status Clean;Intact;Dry 06/09/20 1829  Antimicrobial disc in place? Not Applicable 54/65/03 5465  Dressing Intervention New dressing 06/09/20 1829  Dressing Change Due 06/16/20 06/09/20 1829       Gordan Payment 06/09/2020, 6:31 PM

## 2020-06-09 NOTE — Plan of Care (Signed)
  Problem: Education: Goal: Knowledge of General Education information will improve Description: Including pain rating scale, medication(s)/side effects and non-pharmacologic comfort measures Outcome: Progressing   Problem: Health Behavior/Discharge Planning: Goal: Ability to manage health-related needs will improve Outcome: Progressing   Problem: Clinical Measurements: Goal: Ability to maintain clinical measurements within normal limits will improve Outcome: Progressing Goal: Will remain free from infection Outcome: Progressing Goal: Diagnostic test results will improve Outcome: Progressing Goal: Respiratory complications will improve Outcome: Progressing Goal: Cardiovascular complication will be avoided Outcome: Progressing   Problem: Activity: Goal: Risk for activity intolerance will decrease Outcome: Progressing   Problem: Nutrition: Goal: Adequate nutrition will be maintained Outcome: Progressing   Problem: Coping: Goal: Level of anxiety will decrease Outcome: Progressing   Problem: Elimination: Goal: Will not experience complications related to urinary retention Outcome: Progressing   Problem: Pain Managment: Goal: General experience of comfort will improve Outcome: Progressing   Problem: Safety: Goal: Ability to remain free from injury will improve Outcome: Progressing   

## 2020-06-09 NOTE — Progress Notes (Addendum)
  Speech Language Pathology Treatment: Dysphagia  Patient Details Name: Brent [name]. MRN: [id number] DOB: 09-Nov-1945 Today's Date: 06/09/2020 Time: 4098-1191 SLP Time Calculation (min) (ACUTE ONLY): 10 min  Assessment / Plan / Recommendation Clinical Impression  Pt has had improvement in mentation as compared to 9/17.  Pt was agreeable to PO trials of mechanical soft solids and thin liquids. Pt states he feels his swallowing has gotten much better of the past 3 or 4 days.  Pt consumed 4 oz of mechanical soft solids with SLP spoon feeding, and thin liquid by straw.  There were no clinical s/s of aspiration with any consistencies trialed.  Pt exhibited excellent oral clearance of solids.  Pt appears safe to continue current diet and has PEG for nutritional support as well.  If pt experiences new or recurring difficulty with swallowing, please re-consult speech therapy. Recommend continuing mechanical soft diet with thin liquids.  Pt has no further ST needs.  SLP will sign off at this time.     HPI HPI: 74 y.o. male  with past medical history of recently diagnosed transverse myelitis and paraplegia secondary to transverse myelitis returns to ED on 06/01/2020 with difficulty eating, failure to thrive, and wounds on sacrum and B feet. Was on CIR 7/7-8/6 and then to [facility]. Other PMH: OSA, HTN, CAD, DM.      SLP Plan  All goals met       Recommendations  Diet recommendations: Dysphagia 3 (mechanical soft);Thin liquid Liquids provided via: Cup;Straw Medication Administration:  (as tolerated) Supervision: Staff to assist with self feeding Compensations: Slow rate;Small sips/bites Postural Changes and/or Swallow Maneuvers: Seated upright 90 degrees                Oral Care Recommendations: Oral care BID Follow up Recommendations: None SLP Visit Diagnosis: Dysphagia, unspecified (R13.10) Plan: All goals met       [place] , MA, [place] Office: [phone]; Pager (9/21): [phone] 06/09/2020, 10:34 AM

## 2020-06-09 NOTE — Progress Notes (Signed)
Nutrition Follow-up  RD working remotely.  DOCUMENTATION CODES:   Non-severe (moderate) malnutrition in context of chronic illness, Obesity unspecified  INTERVENTION:   -ContinueOsmolite 1.5@ 60ml/hrvia g-tube  90ml Prosource TF BID.   150 ml free water flush every 4 hours  Tube feeding regimen provides2280kcal (100% of needs),134grams of protein, and 1097ml of H2O.Total free water: 1997 ml daily  -Continue MVI with minerals daily  -1 packet Juven BIDper tube, each packet provides 95 calories, 2.5 grams of protein (collagen), and 9.8 grams of carbohydrate (3 grams sugar); also contains 7 grams of L-arginine and L-glutamine, 300 mg vitamin C, 15 mg vitamin E, 1.2 mcg vitamin B-12, 9.5 mg zinc, 200 mg calcium, and 1.5 g Calcium Beta-hydroxy-Beta-methylbutyrate to support wound healing  NUTRITION DIAGNOSIS:   Moderate Malnutrition related to chronic illness (transvere myelitis) as evidenced by energy intake < 75% for > or equal to 1 month, moderate fat depletion, mild fat depletion, mild muscle depletion, moderate muscle depletion.  Ongoing  GOAL:   Patient will meet greater than or equal to 90% of their needs  Met with TF  MONITOR:   Diet advancement, Labs, Weight trends, TF tolerance, Skin, I & O's  REASON FOR ASSESSMENT:   Consult Enteral/tube feeding initiation and management  ASSESSMENT:   Brent F Dayley Jr. is a 74 y.o. male with medical history significant of recent diagnosis of transverse myelitis status post rituximab, paraplegia secondary to transverse myelitis, HTN, HLD, IDDM, CAD, presented with trouble eating and failure to thrive.  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  Patient then underwent inpatient rehab but his further recovery has been hampered  by his nutrition status.  Looks like patient developed mild dysphagia and dryness in the mouth on last admission, patient told me that has never went away.  Since last week, his swallow condition worsened, same time, his appetite also dropped significantly.  He told me that even swallow at the point water caused "soreness on the back of the mouth".  9/14- rectal tube placed, s/p BSE-recommending full liquid diet 9/16- s/pProcedure:laparoscopic loop sigmoid colostomy, laparoscopic gastrostomy tube placement, lysis of adhesions (15 minutes), debridement of sacral decubitus ulcer (14cm x 14cm x 3cm); TF initiated 9/17- advanced to dysphagia 3 diet 9/21- s/p PICC placement  Reviewed I/O's: -3.3 L x 24 hours and -2.3 L since admission  UOP: 3.7 L x 24 hours  Colostomy output: 50 ml x 24 hours  Attempted to speak with pt via call to hospital room phone, however, no answer.   Pt remains with very poor oral intake; noted meal completion 0-10%.   Pt tolerating TF well at goal rate.   Per TOC notes, plan to d/c to SNF once medically stable.   Medications reviewed and include vitamin C, lovenox, diflucan, vitamin B-12, zinc sulfate, and florastor.   Lab Results  Component Value Date   HGBA1C 6.6 (H) 06/01/2020   PTA DM medications are .   Labs reviewed: K, Mg, and Phos WDL. CBGS: 191-201 (inpatient orders for glycemic control are 0-15 units insulin aspart every 4 hours and 15 units insulin glargine daily).   Diet Order:   Diet Order            DIET DYS 3 Room service appropriate? Yes; Fluid consistency: Thin  Diet effective now                   EDUCATION NEEDS:   No education needs have been identified at this time  Skin:  Skin Assessment: Skin Integrity Issues: Skin Integrity Issues:: DTI, Unstageable DTI: lt heel Unstageable: sacrum, highly necrotic  Last BM:  06/04/20 (via rectal tube- no colostomy output yet)  Height:   Ht Readings from Last 1 Encounters:  06/01/20 6'  (1.829 m)    Weight:   Wt Readings from Last 1 Encounters:  06/09/20 123.4 kg    Ideal Body Weight:  74.8 kg (adjusted for paraplegia)  BMI:  Body mass index is 36.89 kg/m.  Estimated Nutritional Needs:   Kcal:  2250-2450  Protein:  125-150 grams  Fluid:  > 2 L     W, RD, LDN, CDCES Registered Dietitian II Certified Diabetes Care and Education Specialist Please refer to AMION for RD and/or RD on-call/weekend/after hours pager 

## 2020-06-09 NOTE — TOC Progression Note (Signed)
Transition of Care (TOC) - Progression Note    Patient Details  Name: Catalino Plascencia. MRN: [id number] Date of Birth: 1945-10-16  Transition of Care [facility]) CM/SW [place], [place] Phone Number: 06/09/2020, 10:22 AM  Clinical Narrative:    CSW spoke with pt wife [name] via telephone, she had her questions answered by [place]. CSW confirmed pt plan would be to return to [facility] when he is ready. [name] had questions about ENT consult which I encouraged her to communicate to MD/RN when she comes today.   Confirmed she has my number for any further questions related to d/c planning.    Expected Discharge Plan: [place] Barriers to Discharge: Continued Medical Work up  Expected Discharge Plan and Services Expected Discharge Plan: [place] In-house Referral: Clinical Social Work Discharge Planning Services: CM Consult Post Acute Care Choice: [place] Living arrangements for the past 2 months: [place], [place]  Readmission Risk Interventions Readmission Risk Prevention Plan 06/08/2020  Transportation Screening Complete  Medication Review Press photographer) Referral to Pharmacy  PCP or Specialist appointment within 3-5 days of discharge Not Complete  PCP/Specialist Appt Not Complete comments SNF resident  [facility] or [place] Complete  SW Recovery Care/Counseling Consult Complete  Palliative Care Screening Complete  Skilled Nursing Facility Complete  Some recent data might be hidden

## 2020-06-09 NOTE — Consult Note (Signed)
[place] Nurse ostomy follow up One piece convex pouches and barrier rings placed at bedside for future use. Primary RN in room and aware. Val Riles, RN, MSN, CWOCN, CNS-BC, pager [phone]

## 2020-06-10 DIAGNOSIS — G4733 Obstructive sleep apnea (adult) (pediatric): Secondary | ICD-10-CM | POA: Diagnosis not present

## 2020-06-10 DIAGNOSIS — E782 Mixed hyperlipidemia: Secondary | ICD-10-CM | POA: Diagnosis not present

## 2020-06-10 DIAGNOSIS — G360 Neuromyelitis optica [Devic]: Secondary | ICD-10-CM | POA: Diagnosis not present

## 2020-06-10 DIAGNOSIS — N319 Neuromuscular dysfunction of bladder, unspecified: Secondary | ICD-10-CM | POA: Diagnosis not present

## 2020-06-10 DIAGNOSIS — R1310 Dysphagia, unspecified: Secondary | ICD-10-CM | POA: Diagnosis not present

## 2020-06-10 DIAGNOSIS — E871 Hypo-osmolality and hyponatremia: Secondary | ICD-10-CM | POA: Diagnosis not present

## 2020-06-10 DIAGNOSIS — R52 Pain, unspecified: Secondary | ICD-10-CM | POA: Diagnosis not present

## 2020-06-10 DIAGNOSIS — M5489 Other dorsalgia: Secondary | ICD-10-CM | POA: Diagnosis not present

## 2020-06-10 DIAGNOSIS — K5900 Constipation, unspecified: Secondary | ICD-10-CM | POA: Diagnosis not present

## 2020-06-10 DIAGNOSIS — E1165 Type 2 diabetes mellitus with hyperglycemia: Secondary | ICD-10-CM | POA: Diagnosis not present

## 2020-06-10 DIAGNOSIS — R531 Weakness: Secondary | ICD-10-CM | POA: Diagnosis not present

## 2020-06-10 DIAGNOSIS — L89310 Pressure ulcer of right buttock, unstageable: Secondary | ICD-10-CM | POA: Diagnosis not present

## 2020-06-10 DIAGNOSIS — Z7189 Other specified counseling: Secondary | ICD-10-CM | POA: Diagnosis not present

## 2020-06-10 DIAGNOSIS — Z7401 Bed confinement status: Secondary | ICD-10-CM | POA: Diagnosis not present

## 2020-06-10 DIAGNOSIS — L89610 Pressure ulcer of right heel, unstageable: Secondary | ICD-10-CM | POA: Diagnosis not present

## 2020-06-10 DIAGNOSIS — G8250 Quadriplegia, unspecified: Secondary | ICD-10-CM | POA: Diagnosis not present

## 2020-06-10 DIAGNOSIS — Z299 Encounter for prophylactic measures, unspecified: Secondary | ICD-10-CM | POA: Diagnosis not present

## 2020-06-10 DIAGNOSIS — G373 Acute transverse myelitis in demyelinating disease of central nervous system: Secondary | ICD-10-CM | POA: Diagnosis not present

## 2020-06-10 DIAGNOSIS — L89156 Pressure-induced deep tissue damage of sacral region: Secondary | ICD-10-CM | POA: Diagnosis not present

## 2020-06-10 DIAGNOSIS — R739 Hyperglycemia, unspecified: Secondary | ICD-10-CM | POA: Diagnosis not present

## 2020-06-10 DIAGNOSIS — I2510 Atherosclerotic heart disease of native coronary artery without angina pectoris: Secondary | ICD-10-CM | POA: Diagnosis not present

## 2020-06-10 DIAGNOSIS — R7309 Other abnormal glucose: Secondary | ICD-10-CM | POA: Diagnosis not present

## 2020-06-10 DIAGNOSIS — Z20828 Contact with and (suspected) exposure to other viral communicable diseases: Secondary | ICD-10-CM | POA: Diagnosis not present

## 2020-06-10 DIAGNOSIS — Z23 Encounter for immunization: Secondary | ICD-10-CM | POA: Diagnosis not present

## 2020-06-10 DIAGNOSIS — Z20822 Contact with and (suspected) exposure to covid-19: Secondary | ICD-10-CM | POA: Diagnosis not present

## 2020-06-10 DIAGNOSIS — G63 Polyneuropathy in diseases classified elsewhere: Secondary | ICD-10-CM | POA: Diagnosis not present

## 2020-06-10 DIAGNOSIS — L89510 Pressure ulcer of right ankle, unstageable: Secondary | ICD-10-CM | POA: Diagnosis not present

## 2020-06-10 DIAGNOSIS — K592 Neurogenic bowel, not elsewhere classified: Secondary | ICD-10-CM | POA: Diagnosis not present

## 2020-06-10 DIAGNOSIS — Z931 Gastrostomy status: Secondary | ICD-10-CM | POA: Diagnosis not present

## 2020-06-10 DIAGNOSIS — R41841 Cognitive communication deficit: Secondary | ICD-10-CM | POA: Diagnosis not present

## 2020-06-10 DIAGNOSIS — R5381 Other malaise: Secondary | ICD-10-CM | POA: Diagnosis not present

## 2020-06-10 DIAGNOSIS — E119 Type 2 diabetes mellitus without complications: Secondary | ICD-10-CM | POA: Diagnosis not present

## 2020-06-10 DIAGNOSIS — E08622 Diabetes mellitus due to underlying condition with other skin ulcer: Secondary | ICD-10-CM | POA: Diagnosis not present

## 2020-06-10 DIAGNOSIS — G8220 Paraplegia, unspecified: Secondary | ICD-10-CM | POA: Diagnosis not present

## 2020-06-10 DIAGNOSIS — Z209 Contact with and (suspected) exposure to unspecified communicable disease: Secondary | ICD-10-CM | POA: Diagnosis not present

## 2020-06-10 DIAGNOSIS — D508 Other iron deficiency anemias: Secondary | ICD-10-CM | POA: Diagnosis not present

## 2020-06-10 DIAGNOSIS — Z933 Colostomy status: Secondary | ICD-10-CM | POA: Diagnosis not present

## 2020-06-10 DIAGNOSIS — L89314 Pressure ulcer of right buttock, stage 4: Secondary | ICD-10-CM | POA: Diagnosis not present

## 2020-06-10 DIAGNOSIS — L89513 Pressure ulcer of right ankle, stage 3: Secondary | ICD-10-CM | POA: Diagnosis not present

## 2020-06-10 DIAGNOSIS — R2681 Unsteadiness on feet: Secondary | ICD-10-CM | POA: Diagnosis not present

## 2020-06-10 DIAGNOSIS — M6281 Muscle weakness (generalized): Secondary | ICD-10-CM | POA: Diagnosis not present

## 2020-06-10 DIAGNOSIS — I959 Hypotension, unspecified: Secondary | ICD-10-CM | POA: Diagnosis not present

## 2020-06-10 DIAGNOSIS — R532 Functional quadriplegia: Secondary | ICD-10-CM | POA: Diagnosis not present

## 2020-06-10 DIAGNOSIS — L89300 Pressure ulcer of unspecified buttock, unstageable: Secondary | ICD-10-CM | POA: Diagnosis not present

## 2020-06-10 DIAGNOSIS — R627 Adult failure to thrive: Secondary | ICD-10-CM | POA: Diagnosis not present

## 2020-06-10 DIAGNOSIS — R1319 Other dysphagia: Secondary | ICD-10-CM | POA: Diagnosis not present

## 2020-06-10 DIAGNOSIS — M6258 Muscle wasting and atrophy, not elsewhere classified, other site: Secondary | ICD-10-CM | POA: Diagnosis not present

## 2020-06-10 DIAGNOSIS — E11622 Type 2 diabetes mellitus with other skin ulcer: Secondary | ICD-10-CM | POA: Diagnosis not present

## 2020-06-10 DIAGNOSIS — M868X8 Other osteomyelitis, other site: Secondary | ICD-10-CM | POA: Diagnosis not present

## 2020-06-10 DIAGNOSIS — E1142 Type 2 diabetes mellitus with diabetic polyneuropathy: Secondary | ICD-10-CM | POA: Diagnosis not present

## 2020-06-10 DIAGNOSIS — R278 Other lack of coordination: Secondary | ICD-10-CM | POA: Diagnosis not present

## 2020-06-10 DIAGNOSIS — D72828 Other elevated white blood cell count: Secondary | ICD-10-CM | POA: Diagnosis not present

## 2020-06-10 DIAGNOSIS — I10 Essential (primary) hypertension: Secondary | ICD-10-CM | POA: Diagnosis not present

## 2020-06-10 DIAGNOSIS — N318 Other neuromuscular dysfunction of bladder: Secondary | ICD-10-CM | POA: Diagnosis not present

## 2020-06-10 DIAGNOSIS — E118 Type 2 diabetes mellitus with unspecified complications: Secondary | ICD-10-CM | POA: Diagnosis not present

## 2020-06-10 DIAGNOSIS — M75101 Unspecified rotator cuff tear or rupture of right shoulder, not specified as traumatic: Secondary | ICD-10-CM | POA: Diagnosis not present

## 2020-06-10 DIAGNOSIS — M4628 Osteomyelitis of vertebra, sacral and sacrococcygeal region: Secondary | ICD-10-CM | POA: Diagnosis not present

## 2020-06-10 DIAGNOSIS — L89154 Pressure ulcer of sacral region, stage 4: Secondary | ICD-10-CM | POA: Diagnosis not present

## 2020-06-10 DIAGNOSIS — K1230 Oral mucositis (ulcerative), unspecified: Secondary | ICD-10-CM | POA: Diagnosis not present

## 2020-06-10 DIAGNOSIS — M2550 Pain in unspecified joint: Secondary | ICD-10-CM | POA: Diagnosis not present

## 2020-06-10 DIAGNOSIS — D75838 Other thrombocytosis: Secondary | ICD-10-CM | POA: Diagnosis not present

## 2020-06-10 DIAGNOSIS — L98499 Non-pressure chronic ulcer of skin of other sites with unspecified severity: Secondary | ICD-10-CM | POA: Diagnosis not present

## 2020-06-10 DIAGNOSIS — Z7409 Other reduced mobility: Secondary | ICD-10-CM | POA: Diagnosis not present

## 2020-06-10 DIAGNOSIS — M869 Osteomyelitis, unspecified: Secondary | ICD-10-CM | POA: Diagnosis not present

## 2020-06-10 DIAGNOSIS — U071 COVID-19: Secondary | ICD-10-CM | POA: Diagnosis not present

## 2020-06-10 DIAGNOSIS — D6489 Other specified anemias: Secondary | ICD-10-CM | POA: Diagnosis not present

## 2020-06-10 DIAGNOSIS — L89620 Pressure ulcer of left heel, unstageable: Secondary | ICD-10-CM | POA: Diagnosis not present

## 2020-06-10 DIAGNOSIS — R1312 Dysphagia, oropharyngeal phase: Secondary | ICD-10-CM | POA: Diagnosis not present

## 2020-06-10 DIAGNOSIS — Z794 Long term (current) use of insulin: Secondary | ICD-10-CM | POA: Diagnosis not present

## 2020-06-10 DIAGNOSIS — S31000D Unspecified open wound of lower back and pelvis without penetration into retroperitoneum, subsequent encounter: Secondary | ICD-10-CM | POA: Diagnosis not present

## 2020-06-10 DIAGNOSIS — E440 Moderate protein-calorie malnutrition: Secondary | ICD-10-CM | POA: Diagnosis not present

## 2020-06-10 DIAGNOSIS — L0231 Cutaneous abscess of buttock: Secondary | ICD-10-CM | POA: Diagnosis not present

## 2020-06-10 LAB — BASIC METABOLIC PANEL
Anion gap: 8 (ref 5–15)
BUN: 11 mg/dL (ref 8–23)
CO2: 26 mmol/L (ref 22–32)
Calcium: 8.6 mg/dL — ABNORMAL LOW (ref 8.9–10.3)
Chloride: 101 mmol/L (ref 98–111)
Creatinine, Ser: 0.31 mg/dL — ABNORMAL LOW (ref 0.61–1.24)
GFR calc Af Amer: >60 mL/min (ref >60)
GFR calc non Af Amer: >60 mL/min (ref >60)
Glucose, Bld: 129 mg/dL — ABNORMAL HIGH (ref 70–99)
Potassium: 4.2 mmol/L (ref 3.5–5.1)
Sodium: 135 mmol/L (ref 135–145)

## 2020-06-10 LAB — CBC
HCT: 25.7 % — ABNORMAL LOW (ref 39.0–52.0)
Hemoglobin: 7.7 g/dL — ABNORMAL LOW (ref 13.0–17.0)
MCH: 25.9 pg — ABNORMAL LOW (ref 26.0–34.0)
MCHC: 30 g/dL (ref 30.0–36.0)
MCV: 86.5 fL (ref 80.0–100.0)
Platelets: 278 10*3/uL (ref 150–400)
RBC: 2.97 MIL/uL — ABNORMAL LOW (ref 4.22–5.81)
RDW: 15.4 % (ref 11.5–15.5)
WBC: 6.6 10*3/uL (ref 4.0–10.5)
nRBC: 0 % (ref 0.0–0.2)

## 2020-06-10 LAB — QUANTIFERON-TB GOLD PLUS (RQFGPL)
QuantiFERON Mitogen Value: 1.67 IU/mL
QuantiFERON Nil Value: 0.15 IU/mL
QuantiFERON TB1 Ag Value: 0.27 IU/mL
QuantiFERON TB2 Ag Value: 0.24 IU/mL

## 2020-06-10 LAB — GLUCOSE, CAPILLARY
Glucose-Capillary: 131 mg/dL — ABNORMAL HIGH (ref 70–99)
Glucose-Capillary: 142 mg/dL — ABNORMAL HIGH (ref 70–99)
Glucose-Capillary: 177 mg/dL — ABNORMAL HIGH (ref 70–99)
Glucose-Capillary: 185 mg/dL — ABNORMAL HIGH (ref 70–99)
Glucose-Capillary: 214 mg/dL — ABNORMAL HIGH (ref 70–99)

## 2020-06-10 LAB — QUANTIFERON-TB GOLD PLUS: QuantiFERON-TB Gold Plus: NEGATIVE

## 2020-06-10 MED ORDER — HEPARIN SOD (PORK) LOCK FLUSH 100 UNIT/ML IV SOLN
250.0000 [IU] | INTRAVENOUS | Status: AC | PRN
  Administered 2020-06-10: 250 [IU]
  Filled 2020-06-10: qty 2.5

## 2020-06-10 MED ORDER — PROSOURCE TF PO LIQD: 90.0000 mL | Freq: Two times a day (BID) | ORAL | Status: DC

## 2020-06-10 MED ORDER — ZINC SULFATE 220 (50 ZN) MG PO CAPS: 220.0000 mg | ORAL_CAPSULE | Freq: Every day | ORAL | Status: DC

## 2020-06-10 MED ORDER — TAMSULOSIN HCL 0.4 MG PO CAPS: 0.4000 mg | ORAL_CAPSULE | Freq: Every day | ORAL | Status: DC

## 2020-06-10 MED ORDER — OXYBUTYNIN CHLORIDE 5 MG PO TABS: 5.0000 mg | ORAL_TABLET | Freq: Every day | ORAL | Status: DC

## 2020-06-10 MED ORDER — INSULIN GLARGINE 100 UNITS/ML SOLOSTAR PEN: 15.0000 [IU] | PEN_INJECTOR | Freq: Every day | SUBCUTANEOUS | 11 refills | Status: DC

## 2020-06-10 MED ORDER — GABAPENTIN 300 MG PO CAPS: 300.0000 mg | ORAL_CAPSULE | Freq: Every day | ORAL | Status: DC

## 2020-06-10 MED ORDER — FREE WATER: 150.0000 mL | Status: DC

## 2020-06-10 MED ORDER — OSMOLITE 1.5 CAL PO LIQD: 1000.0000 mL | ORAL | 0 refills | Status: DC

## 2020-06-10 MED ORDER — LIDOCAINE 5 % EX PTCH: 1.0000 | MEDICATED_PATCH | CUTANEOUS | 0 refills | Status: DC

## 2020-06-10 MED ORDER — PANTOPRAZOLE SODIUM 40 MG PO PACK: 40.0000 mg | PACK | Freq: Every day | ORAL | Status: DC

## 2020-06-10 MED ORDER — AMPICILLIN-SULBACTAM IV (FOR PTA / DISCHARGE USE ONLY): 3.0000 g | Freq: Four times a day (QID) | INTRAVENOUS | 0 refills | Status: DC

## 2020-06-10 MED ORDER — ADULT MULTIVITAMIN W/MINERALS CH: 1.0000 | ORAL_TABLET | Freq: Every day | ORAL | Status: DC

## 2020-06-10 MED ORDER — INSULIN ASPART 100 UNIT/ML ~~LOC~~ SOLN: 0.0000 [IU] | SUBCUTANEOUS | 11 refills | Status: DC

## 2020-06-10 MED ORDER — GABAPENTIN 100 MG PO CAPS: 100.0000 mg | ORAL_CAPSULE | Freq: Every day | ORAL | Status: DC

## 2020-06-10 MED ORDER — FLUCONAZOLE 100 MG PO TABS
100.0000 mg | ORAL_TABLET | Freq: Every day | ORAL | Status: AC
Start: 2020-06-10 — End: 2020-06-17

## 2020-06-10 MED ORDER — TRAMADOL HCL 50 MG PO TABS
50.0000 mg | ORAL_TABLET | Freq: Two times a day (BID) | ORAL | 0 refills | Status: DC | PRN
Start: 2020-06-10 — End: 2020-11-22

## 2020-06-10 MED ORDER — SACCHAROMYCES BOULARDII 250 MG PO CAPS: 250.0000 mg | ORAL_CAPSULE | Freq: Two times a day (BID) | ORAL | Status: DC

## 2020-06-10 MED ORDER — ONDANSETRON 4 MG PO TBDP
4.0000 mg | ORAL_TABLET | Freq: Two times a day (BID) | ORAL | 0 refills | Status: DC
Start: 2020-06-10 — End: 2020-11-22

## 2020-06-10 NOTE — Social Work (Signed)
Clinical Social Worker facilitated patient discharge including contacting patient family and facility to confirm patient discharge plans.  Clinical information faxed to facility and family agreeable with plan.  CSW arranged ambulance transport via [facility] to [facility] [place] RN to call [phone]  with report prior to discharge.  Clinical Social Worker will sign off for now as social work intervention is no longer needed. Please consult [place] again if new need arises.  Westley Hummer, MSW, LCSW Clinical Social Worker

## 2020-06-10 NOTE — Consult Note (Signed)
   [facility] CM Inpatient Consult   06/10/2020  Jt Brabec [birth year] [id number]   Follow up:  Medicare for SNF  Chart review reveals patient is for [facility] for short term rehab. Wife is the contact person and will alert [facility] RN [facility] nurse of post hospital transition of care needs.  Natividad Brood, RN BSN [facility] Liaison  [phone] business mobile phone Toll free office [phone]  Fax number: [phone] [email or web address]

## 2020-06-10 NOTE — TOC Transition Note (Signed)
Transition of Care [facility]) - CM/SW Discharge Note   Patient Details  Name: Brent [name]. MRN: [id number] Date of Birth: [birth year]  Transition of [facility]) CM/SW Contact:  Alexander Mt, LCSW Phone Number:  06/10/2020, 3:03 PM   Clinical Narrative:    CSW spoke with pt and pt wife Brent [name] at bedside. Pt and pt wife aware of d/c today- wife is understandably hesitant due to pt multiple care needs. Confirmed with Irine Seal, admissions liaison that pt has all needed DME and tube feeds, pt wife would like to visit with pt before he discharges. Agreed for [facility] to be called for 2pm, left [facility] papers and scripts on pt chart. No additional needs at this time, RN to call report for pt to SNF.    Final next level of care: Skilled Nursing Facility Barriers to Discharge: Barriers Resolved   Patient Goals and CMS Choice Patient states their goals for this hospitalization and ongoing recovery are:: to have appropriate care for wound care/feeding tube CMS Medicare.gov Compare Post Acute Care list provided to:: Patient Represenative (must comment) (pt wife) Choice offered to / list presented to : Spouse  Discharge Placement   Existing PASRR number confirmed : 06/05/20          Patient chooses bed at: [facility] Patient to be transferred to facility by: [place] Name of family member notified: pt wife Brent [name] at bedside Patient and family notified of of transfer: 06/10/20  Discharge Plan and Services In-house Referral: Clinical Social Work Discharge Planning Services: CM Consult Post Acute Care Choice: [place]            Readmission Risk Interventions Readmission Risk Prevention Plan 06/08/2020  Transportation Screening Complete  Medication Review Press photographer) Referral to Pharmacy  PCP or Specialist appointment within 3-5 days of discharge Not Complete  PCP/Specialist Appt Not Complete comments SNF resident  [facility] or [place] Complete  SW Recovery Care/Counseling  Consult Complete  Palliative Care Screening Complete  Skilled Nursing Facility Complete  Some recent data might be hidden

## 2020-06-10 NOTE — Discharge Summary (Signed)
Physician Discharge Summary   Patient ID: Brent [name]. MRN: [id number] DOB/AGE: [birth year] 74 y.o.  Admit date: 06/01/2020 Discharge date: 06/10/2020  Primary Care Physician:  [name], [name], MD   Recommendations for Outpatient Follow-up:  1. Follow up with PCP in 1-2 weeks 2. Continue IV Unasyn, stop date on 07/17/2020 3. Continue tube feeds, dysphagia 3 diet, twice daily dressing changes 4. PICC line placed on 06/09/2020 5. Continue free water 150 cc per tube every 4 hours 6. Continue Osmolite 1.5 Cal tube feeds at 60 cc an hour  Home Health: Patient discharging to skilled nursing facility  equipment/Devices:   Discharge Condition: stable  CODE STATUS:  full code Diet recommendation: Dysphagia 3 diet with thin liquids   Discharge Diagnoses:    Left gluteal abscess/necrotic sacral wound L4-L5 discitis/osteomyelitis Proteus mirabilis bacteremia Dysphagia . FTT (failure to thrive) in adult . Diabetes mellitus type 2 . Hyperlipidemia . Essential hypertension . History of transverse myelitis ([place]) with paraplegia . Hypokalemia . Hypomagnesemia . Malnutrition of moderate degree . History of neuromyelitis optica Pressure injury, sacral, left anterior ankle, right anterior ankle, right heel, left heel, POA   Consults:  General surgery Gastroenterology Infectious disease   Allergies:   Allergies  Allergen Reactions  . Levofloxacin Rash  . Penicillins Hives and Rash    Tolerated Unasyn, cefepime and cephalexin     DISCHARGE MEDICATIONS: Allergies as of 06/10/2020      Reactions   Levofloxacin Rash   Penicillins Hives, Rash   Tolerated Unasyn, cefepime and cephalexin      Medication List    STOP taking these medications   empagliflozin 10 MG Tabs tablet Commonly known as: JARDIANCE   feeding supplement (PRO-STAT SUGAR FREE 64) Liqd   lisinopril 2.5 MG tablet Commonly known as: ZESTRIL   pantoprazole 40 MG tablet Commonly known  as: PROTONIX Replaced by: pantoprazole sodium 40 mg/20 mL Pack     TAKE these medications   acetaminophen 325 MG tablet Commonly known as: TYLENOL Take 2 tablets (650 mg total) by mouth every 6 (six) hours as needed for headache.   albuterol (2.5 MG/3ML) 0.083% nebulizer solution Commonly known as: PROVENTIL Take 3 mLs (2.5 mg total) by nebulization once as needed for wheezing or shortness of breath (or anaphylaxis due to Rituxan infusion).   ampicillin-sulbactam  IVPB Commonly known as: UNASYN Inject 3 g into the vein every 6 (six) hours. Indication:  Sacral wound infection First Dose: No Last Day of Therapy:  07/17/20 Labs - Once weekly:  CBC/D and BMP, Labs - Every other week:  ESR and CRP Method of administration: Mini-Bag Plus / Gravity Method of administration may be changed at the discretion of home infusion pharmacist based upon assessment of the patient and/or caregiver's ability to self-administer the medication ordered.   antiseptic oral rinse Liqd 10 mLs by Mouth Rinse route 2 (two) times daily. Rinse and spit   aspirin EC 81 MG tablet Take 81 mg by mouth at bedtime. Swallow whole.   atorvastatin 20 MG tablet Commonly known as: LIPITOR Take 1 tablet (20 mg total) by mouth daily.   Biofreeze 4 % Gel Generic drug: Menthol (Topical Analgesic) Apply 1 application topically 2 (two) times daily.   cholestyramine 4 g packet Commonly known as: QUESTRAN Take 4 g by mouth daily.   collagenase ointment Commonly known as: SANTYL Cleanse sacral area and then apply a layer of santyl with damp to dry dressing. Change daily and prn if soiled  Dulcolax 10 MG suppository Generic drug: bisacodyl Place 10 mg rectally daily as needed for moderate constipation.   enoxaparin 40 MG/0.4ML injection Commonly known as: LOVENOX Inject 40 mg into the skin daily.   feeding supplement (OSMOLITE 1.5 CAL) Liqd Place 1,000 mLs into feeding tube continuous.   feeding supplement  (PROSource TF) liquid Place 90 mLs into feeding tube 2 (two) times daily.   fluconazole 100 MG tablet Commonly known as: DIFLUCAN Place 1 tablet (100 mg total) into feeding tube daily for 7 days.   fluticasone 50 MCG/ACT nasal spray Commonly known as: FLONASE Place 2 sprays into both nostrils 2 (two) times daily.   free water Soln Place 150 mLs into feeding tube every 4 (four) hours.   gabapentin 300 MG capsule Commonly known as: NEURONTIN Place 1 capsule (300 mg total) into feeding tube at bedtime. What changed: how to take this   gabapentin 100 MG capsule Commonly known as: NEURONTIN Take 1 capsule (100 mg total) by mouth daily. Give in The Morning, per tube What changed: additional instructions   insulin aspart 100 UNIT/ML injection Commonly known as: novoLOG Inject 0-15 Units into the skin every 4 (four) hours. Sliding scale  CBG 70 - 120: 0 units: CBG 121 - 150: 2 units; CBG 151 - 200: 3 units; CBG 201 - 250: 5 units; CBG 251 - 300: 8 units;CBG 301 - 350: 11 units; CBG 351 - 400: 15 units; CBG > 400 : 15 units and notify MD What changed:   how much to take  when to take this  additional instructions   insulin glargine 100 unit/mL Sopn Commonly known as: LANTUS Inject 15 Units into the skin at bedtime. What changed: how much to take   lidocaine 2 % solution Commonly known as: XYLOCAINE Use as directed 15 mLs in the mouth or throat in the morning, at noon, in the evening, and at bedtime. X 14 days   lidocaine 5 % Commonly known as: LIDODERM Place 1 patch onto the skin daily. Apply to right hip at 8 am and remove at 8 pm daily.   multivitamin with minerals Tabs tablet Place 1 tablet into feeding tube daily. Centrum Silver Men's What changed: how to take this   nystatin powder Commonly known as: MYCOSTATIN/NYSTOP Apply 1 application topically 3 (three) times daily.   ondansetron 4 MG disintegrating tablet Commonly known as: ZOFRAN-ODT Take 1 tablet (4 mg  total) by mouth 2 (two) times daily. X 21 days   oxybutynin 5 MG tablet Commonly known as: DITROPAN Place 1 tablet (5 mg total) into feeding tube daily. What changed: how to take this   pantoprazole sodium 40 mg/20 mL Pack Commonly known as: PROTONIX Place 20 mLs (40 mg total) into feeding tube daily. Replaces: pantoprazole 40 MG tablet   saccharomyces boulardii 250 MG capsule Commonly known as: FLORASTOR Place 1 capsule (250 mg total) into feeding tube 2 (two) times daily. What changed: how to take this   sertraline 100 MG tablet Commonly known as: ZOLOFT Take 150 mg by mouth at bedtime.   tamsulosin 0.4 MG Caps capsule Commonly known as: FLOMAX Take 1 capsule (0.4 mg total) by mouth daily after supper.   traMADol 50 MG tablet Commonly known as: ULTRAM Take 1 tablet (50 mg total) by mouth every 12 (twelve) hours as needed for moderate pain.   Vascepa 1 g capsule Generic drug: icosapent Ethyl Take 2 capsules (2 g total) by mouth 2 (two) times daily.   vitamin  B-12 500 MCG tablet Commonly known as: CYANOCOBALAMIN Take 500 mcg by mouth daily.   VITAMIN C PO Take 500 mg by mouth daily.   Voltaren 1 % Gel Generic drug: diclofenac Sodium Apply 2 g topically 2 (two) times daily. Apply to Right shoulder   zinc sulfate 220 (50 Zn) MG capsule Place 1 capsule (220 mg total) into feeding tube daily. What changed: how to take this            Discharge Care Instructions  (From admission, onward)         Start     Ordered   06/10/20 0000  Change dressing on IV access line weekly and PRN  (Home infusion instructions - [facility] )        06/10/20 0943   06/10/20 0000  Discharge wound care:       Comments: As above.  Please saline moistened gauze into the sacral, covered with foam dressing   06/10/20 0943           Brief H and P: For complete details please refer to admission H and P, but in brief Devanta Daniel. is a 74 y.o. male with a history  of transverse myelitis status post rituximab, paraplegia secondary to transverse myelitis, hypertension, hyperlipidemia, insulin-dependent diabetes mellitus, CAD.  Patient presented secondary to trouble eating and failure to thrive and was found to have a left gluteal abscess in addition to discitis/osteomyelitis on imaging.  Patient started empirically on antibiotics for management in addition to general surgery consult for debridement of sacral wound/abscess.  Patient was admitted for further work-up  Hospital Course:    Left gluteal abscess/necrotic sacral wound L4-L5 discitis/osteomyelitis Proteus mirabilis bacteremia - General surgery was consulted for debridement.  Blood cultures significant for proteus mirabilis in 1 out of 4 samples.  Unlikely that this is a contaminant in setting of gluteal abscess and discitis. Patient is s/p diverting loop colostomy and debridement on 9/16. Rectal tube discontinued. -Infectious disease recommendations: Unasyn IV, End date of 07/17/2020 -General surgery recommendations: tube feeds, dysphagia 3 diet, BID dressing changes -PICC line placed on 06/09/2020  History of transverse myelitis with paraplegia Continue PT OT, skilled nursing facility  Dysphagia -Esophagram performed on 9/14 without evidence of esophageal dysmotility. Likely oral phase disability. Speech therapy was consulted. Complicated by Xerostomia.  - GI was consulted who recommended ENT consult. Oral cavity appears moist on exam, however. Patient is tolerating oral diet better. -Speech therapy recommended dysphagia 3 diet with thin liquids, Tube feeds with free water -ENT outpatient follow-up; Dr Lonny Prude discussed with Dr. Constance Holster who recommended outpatient follow-up  Failure to thrive In setting of above. G-tube placed per general surgery. Tube feeds at goal rate. Electrolytes improved. -Continue tube feeds, free water, follow BMET closely   Abdominal nodule Unknown etiology. Patient  states he has had it for some time. Otherwise unremarkable.  Hypokalemia -Resolved, potassium 4.2 at the time of discharge  Hypomagnesemia -Replaced IV, magnesium 1.7 on 9/21  Diabetes mellitus, type II Hemoglobin A1c of 6.6%. -Continue sliding scale insulin, every 4 hours, Lantus  Hyperlipidemia -Continue atorvastatin  Essential hypertension On lisinopril as an outpatient, currently held. Blood pressure has been mostly controlled while inpatient.  Confusion Intermittent. Unsure of etiology. Folate, B12, TSH normal. Resolved.  Neuromyelitis optica Follows with neurology. Neurology has requested Hepatitis B/C/TB testing, negative.  Pressure injury, POA Sacral, left anterior ankle, right anterior ankle, right heel, left heel.  All present on admission  COVID-19 test  9/21 negative  Day of Discharge S: No acute issues overnight, no fevers or chills, no worsening abdominal pain, tolerating tube feeds  BP (!) 116/51 (BP Location: Left Arm)   Pulse 75   Temp 97.6 F (36.4 C)   Resp 17   Ht 6' (1.829 m)   Wt 122.9 kg   SpO2 96%   BMI 36.75 kg/m   Physical Exam: General: Alert and awake, chronically ill-appearing, NAD HEENT: anicteric sclera, pupils reactive to light and accommodation CVS: S1-S2 clear no murmur rubs or gallops Chest: clear to auscultation bilaterally, no wheezing rales or rhonchi Abdomen: soft nontender, nondistended, G tube +, level colostomy Extremities: no cyanosis, clubbing or edema noted bilaterally Neuro: no new deficits    Get Medicines reviewed and adjusted: Please take all your medications with you for your next visit with your Primary MD  Please request your Primary MD to go over all hospital tests and procedure/radiological results at the follow up. Please ask your Primary MD to get all Hospital records sent to his/her office.  If you experience worsening of your admission symptoms, develop shortness of breath, life threatening  emergency, suicidal or homicidal thoughts you must seek medical attention immediately by calling 911 or calling your MD immediately  if symptoms less severe.  You must read complete instructions/literature along with all the possible adverse reactions/side effects for all the Medicines you take and that have been prescribed to you. Take any new Medicines after you have completely understood and accept all the possible adverse reactions/side effects.   Do not drive when taking pain medications.   Do not take more than prescribed Pain, Sleep and Anxiety Medications  Special Instructions: If you have smoked or chewed Tobacco  in the last 2 yrs please stop smoking, stop any regular Alcohol  and or any Recreational drug use.  Wear Seat belts while driving.  Please note  You were cared for by a hospitalist during your hospital stay. Once you are discharged, your primary care physician will handle any further medical issues. Please note that NO REFILLS for any discharge medications will be authorized once you are discharged, as it is imperative that you return to your primary care physician (or establish a relationship with a primary care physician if you do not have one) for your aftercare needs so that they can reassess your need for medications and monitor your lab values.   The results of significant diagnostics from this hospitalization (including imaging, microbiology, ancillary and laboratory) are listed below for reference.      Procedures/Studies:  DG Sacrum/Coccyx  Result Date: 06/01/2020 CLINICAL DATA:  Decubitus ulceration EXAM: SACRUM AND COCCYX - 1  VIEW COMPARISON:  None. FINDINGS: Lateral views obtained. There is air in the perineal region posteriorly which may well extend to the level of the coccyx. No bony destruction appreciable on lateral view. No fracture or diastasis. There is overlying bandage IMPRESSION: Air is seen in the perineal region on lateral view. Air appears to  extend to the level of the coccyx, but no bony destruction is appreciable on lateral view. No fracture or diastasis. Note that CT could be most helpful to better assess the extent of apparent decubitus ulceration as well as to better assess the cortex of the coccyx to exclude possible more subtle bony destruction not evident by radiography. Electronically Signed   By: Lowella Grip III M.D.   On: 06/01/2020 13:36   CT Soft Tissue Neck W Contrast  Result Date: 05/25/2020 CLINICAL  DATA:  Sore throat.  Odynophagia. EXAM: CT NECK WITH CONTRAST TECHNIQUE: Multidetector CT imaging of the neck was performed using the standard protocol following the bolus administration of intravenous contrast. CONTRAST:  9m OMNIPAQUE IOHEXOL 300 MG/ML  SOLN COMPARISON:  Neck MRI 11/15/2018 FINDINGS: Pharynx and larynx: Mildly limited assessment due to metallic dental streak artifact and mild motion. Mild asymmetric thickening of the left-sided posterior and lateral oropharyngeal soft tissues without a discrete mass. No fluid collection or inflammatory changes in the parapharyngeal or retropharyngeal spaces. Widely patent airway. Salivary glands: No inflammation, mass, or stone. Thyroid: Unremarkable. Lymph nodes: No enlarged or suspicious lymph nodes in the neck. Vascular: Major vascular structures of the neck are patent with mild-to-moderate calcified plaque at the carotid bifurcations. No evidence of flow limiting stenosis. Limited intracranial: Unremarkable. Visualized orbits: Unremarkable. Mastoids and visualized paranasal sinuses: Clear. Skeleton: Mild cervical spondylosis and facet arthrosis. Upper chest: Clear lung apices. Other: Left posterolateral neck scarring with history of melanoma removal in this area. No mass or fluid collection. IMPRESSION: 1. Mild asymmetric thickening of the left-sided oropharyngeal soft tissues without a discrete mass identified. Correlate with direct visualization. 2. No abscess or other acute  abnormality identified. Electronically Signed   By: ALogan BoresM.D.   On: 05/25/2020 15:38   MR CERVICAL SPINE W WO CONTRAST  Result Date: 05/25/2020 CLINICAL DATA:  Dysphagia and worsening weakness. History of transverse myelitis/neuromyelitis optica. EXAM: MRI CERVICAL SPINE WITHOUT AND WITH CONTRAST TECHNIQUE: Multiplanar and multiecho pulse sequences of the cervical spine, to include the craniocervical junction and cervicothoracic junction, were obtained without and with intravenous contrast. CONTRAST:  785mGADAVIST GADOBUTROL 1 MMOL/ML IV SOLN COMPARISON:  03/16/2020 FINDINGS: The study is motion degraded despite repeated imaging attempts including severe motion on axial sequences. Alignment: Normal. Vertebrae: No fracture, suspicious osseous lesion, or significant marrow edema. Cord: There is residual T2 hyperintensity in the lower cervical and included upper thoracic spinal cord which has greatly decreased from the prior study. Cord expansion has resolved, and there is now cord volume loss which is most notable in the included thoracic spine. No residual cord enhancement is evident, and the upper cervical cord remains normal in signal. Posterior Fossa, vertebral arteries, paraspinal tissues: Mega cisterna magna. Preserved vertebral artery flow voids. Unremarkable paraspinal soft tissues. Disc levels: Similar appearance of mild cervical spondylosis and facet arthrosis compared to the prior MRI with detailed assessment limited by motion on axial images. No compressive spinal stenosis. IMPRESSION: Decreased signal abnormality in the lower cervical and included upper thoracic spinal cord with interval development of cord volume loss. No residual enhancement or acute finding. Electronically Signed   By: AlLogan Bores.D.   On: 05/25/2020 19:09   MR PELVIS WO CONTRAST  Result Date: 06/01/2020 CLINICAL DATA:  Altered mental status EXAM: MRI PELVIS WITHOUT CONTRAST TECHNIQUE: Multiplanar multisequence MR  imaging of the pelvis was performed. No intravenous contrast was administered. COMPARISON:  None. FINDINGS: Urinary Tract: The visualized distal ureters appear unremarkable. A Foley catheter is seen within the bladder. Bowel: No bowel wall thickening or inflammatory changes are seen. There is a mildly dilated stool-filled rectum. Vascular/Lymphatic: No enlarged pelvic lymph nodes identified. No significant vascular findings. Reproductive: The visualized portion of the deep pelvis is grossly unremarkable. Other: Diffusely increased feathery signal seen within the muscles surrounding the pelvis and paraspinal musculature as well as the iliopsoas. Within the left gluteal musculature there appear to be several T2 bright multilocular fluid collections which extend to the overlying  area of ulceration along the left gluteal fold with a sinus tract present. The largest collection measures 2.1 x 1.0 x 2.1 cm. There is overlying subcutaneous edema seen around the bilateral hips. Musculoskeletal: There is fluid seen within the disc space at L4-L5 with endplate reactive changes and question minimal cortical irregularity. There is a multilocular fluid collection extending from the right-sided disc space which measures approximately 1.7 x 1.1 x 3.2 cm. IMPRESSION: 1. Area of focal ulceration along the posterior left gluteal fold with a sinus tract and multilocular fluid collections within the left gluteal musculature, likely small intramuscular abscesses. 2. There is also findings that are suggestive discitis osteomyelitis at L4-L5 with a right paravertebral loculated fluid collection, likely small abscess measuring 1.7 x 1.1 x 3.2 cm. 3. Diffuse increased signal within the muscles surrounding the pelvis, consistent with myositis. Electronically Signed   By: Prudencio Pair M.D.   On: 06/01/2020 18:50   DG CHEST PORT 1 VIEW  Result Date: 06/04/2020 CLINICAL DATA:  PICC line placement EXAM: PORTABLE CHEST 1 VIEW COMPARISON:   None. FINDINGS: The heart size and mediastinal contours are within normal limits. A left-sided central venous catheter seen at the SVC/brachiocephalic junction. Aortic knob calcifications are seen. Both lungs are clear. The visualized skeletal structures are unremarkable. IMPRESSION: Left-sided central venous catheter is seen with the tip in the SVC brachiocephalic junction. Electronically Signed   By: Prudencio Pair M.D.   On: 06/04/2020 16:27   DG Chest Port 1 View  Result Date: 06/01/2020 CLINICAL DATA:  Fever.  Sacral wound EXAM: PORTABLE CHEST 1 VIEW COMPARISON:  May 25, 2020 FINDINGS: The lungs are clear. The heart size and pulmonary vascularity are normal. No adenopathy. There is aortic atherosclerosis. No bone lesions. IMPRESSION: Lungs clear.  Cardiac silhouette within normal limits. Aortic Atherosclerosis (ICD10-I70.0). Electronically Signed   By: Lowella Grip III M.D.   On: 06/01/2020 13:36   DG Chest Port 1 View  Result Date: 05/25/2020 CLINICAL DATA:  Sore throat, dysphagia. EXAM: PORTABLE CHEST 1 VIEW COMPARISON:  Chest x-rays dated 03/18/2020 and 08/08/2018 FINDINGS: Heart size and mediastinal contours are within normal limits. Coarse lung markings bilaterally suggesting chronic interstitial lung disease. No confluent opacity to suggest superimposed pneumonia or alveolar pulmonary edema. No pleural effusion or pneumothorax is seen. Osseous structures about the chest are unremarkable. IMPRESSION: 1. No active disease. No evidence of pneumonia or pulmonary edema. 2. Probable chronic interstitial lung disease. Electronically Signed   By: Franki Cabot M.D.   On: 05/25/2020 16:18   [place] EKG SITE RITE  Result Date: 06/08/2020 If Site Rite image not attached, placement could not be confirmed due to current cardiac rhythm.  DG ESOPHAGUS W SINGLE CM (SOL OR THIN BA)  Result Date: 06/02/2020 CLINICAL DATA:  Inpatient with paraplegia secondary to transverse myelitis, poor dentition and  failure to thrive with weight loss. EXAM: ESOPHOGRAM/BARIUM SWALLOW TECHNIQUE: Single contrast examination was performed using  thin barium. FLUOROSCOPY TIME:  Fluoroscopy Time:  2 minutes 42 seconds Radiation Exposure Index (if provided by the fluoroscopic device): 23.6 mGy Number of Acquired Spot Images: 0 COMPARISON:  None. FINDINGS: Examination is extremely limited due to patient's inability to drink through a straw (patient appeared to have difficulty forming a seal around the straw). Examination performed in the left lateral decubitus position with mild head elevation. Thin barium was administered into the patient's mouth via syringe. Pharyngeal phase of swallowing is grossly normal with no evidence of laryngeal penetration or tracheobronchial aspiration. Esophageal  motility is grossly normal. Esophageal distensibility appears normal with no evidence of esophageal mass, stricture or ulcer. No evidence of hiatal hernia. Unable to assess for gastroesophageal reflux given the above limitations. Pill not administered due to patient's limited oral phase of swallowing and inability to sit upright. IMPRESSION: 1. Very limited examination due to limited oral phase of swallowing, see comments and consider speech/swallow therapy consultation. 2. Unremarkable esophagus with no gross evidence of esophageal dysmotility or stricture. Electronically Signed   By: Ilona Sorrel M.D.   On: 06/02/2020 14:50      LAB RESULTS: Basic Metabolic Panel: Recent Labs  Lab 06/09/20 0345 06/10/20 0431  NA 133* 135  K 4.4 4.2  CL 100 101  CO2 27 26  GLUCOSE 182* 129*  BUN 7* 11  CREATININE 0.34* 0.31*  CALCIUM 8.4* 8.6*  MG 1.7  --   PHOS 3.2  --    Liver Function Tests: No results for input(s): AST, ALT, ALKPHOS, BILITOT, PROT, ALBUMIN in the last 168 hours. No results for input(s): LIPASE, AMYLASE in the last 168 hours. Recent Labs  Lab 06/06/20 1711  AMMONIA 29   CBC: Recent Labs  Lab 06/05/20 1200  06/05/20 1200 06/10/20 0431  WBC 5.3  --  6.6  HGB 8.3*  --  7.7*  HCT 26.8*  --  25.7*  MCV 82.2   < > 86.5  PLT 297  --  278   < > = values in this interval not displayed.   Cardiac Enzymes: No results for input(s): CKTOTAL, CKMB, CKMBINDEX, TROPONINI in the last 168 hours. BNP: Invalid input(s): POCBNP CBG: Recent Labs  Lab 06/10/20 0346 06/10/20 0803  GLUCAP 131* 142*       Disposition and Follow-up: Discharge Instructions    [facility] pharmacist to adjust dose for Vancomycin, Aminoglycosides and other anti-infective therapies as requested by physician.   Complete by: As directed    [facility] to provide Cath Flo 74m   Complete by: As directed    Administer for PICC line occlusion and as ordered by physician for other access device issues.   Anaphylaxis Kit: Provided to treat any anaphylactic reaction to the medication being provided to the patient if First Dose or when requested by physician   Complete by: As directed    Epinephrine 124mml vial / amp: Administer 0.14m64m0.14ml60mubcutaneously once for moderate to severe anaphylaxis, nurse to call physician and pharmacy when reaction occurs and call 911 if needed for immediate care   Diphenhydramine 50mg25mIV vial: Administer 25-50mg 49mM PRN for first dose reaction, rash, itching, mild reaction, nurse to call physician and pharmacy when reaction occurs   Sodium Chloride 0.9% NS 500ml I58mdminister if needed for hypovolemic blood pressure drop or as ordered by physician after call to physician with anaphylactic reaction   Change dressing on IV access line weekly and PRN   Complete by: As directed    Discharge wound care:   Complete by: As directed    As above.  Please saline moistened gauze into the sacral, covered with foam dressing   Flush IV access with Sodium Chloride 0.9% and Heparin 10 units/ml or 100 units/ml   Complete by: As directed    Home infusion instructions - [facility]   Complete by: As directed    Instructions: Flush IV access with Sodium Chloride 0.9% and Heparin 10units/ml or 100units/ml   Change dressing on IV access line: Weekly and PRN   Instructions  Cath Flo 35m: Administer for PICC Line occlusion and as ordered by physician for other access device   [facility] pharmacist to adjust dose for: Vancomycin, Aminoglycosides and other anti-infective therapies as requested by physician   Increase activity slowly   Complete by: As directed    Method of administration may be changed at the discretion of home infusion pharmacist based upon assessment of the patient and/or caregiver's ability to self-administer the medication ordered   Complete by: As directed        DISPOSITION: [place] for follow-up providers    HIsaac Bliss ERayford Halsted MD Follow up.   Specialty: Internal Medicine Contact information: [phone]        MBurnell Blanks MD .   Specialty: Cardiology Contact information: [address] [place] [phone]        Surgery, [place] on 06/23/2020.   Specialty: General Surgery Why: Follow up appointment scheduled for 11:30 AM. Please arrive 30 min prior to appointment time. Bring photo ID and insurance information with you.  Contact information: [address] [place] [phone]        [facility]             . Call.   Why: Call and schedule an appointment for chronic management of sacral wound.  Contact information: [phone]      RIzora Gala MD. Schedule an appointment as soon as possible for a visit in 2 week(s).   Specialty: Otolaryngology Contact information: [address]  [phone]           Contact information for after-discharge care    Destination    [facility] .   Service: Skilled Nursing Contact information: [address] [phone]                  Time coordinating discharge:  35 minutes  Signed:   REstill CottaM.D. [facility] 06/10/2020, 10:55 AM

## 2020-06-10 NOTE — Care Management Important Message (Signed)
Important Message  Patient Details  Name: Brent [name]. MRN: [id number] Date of Birth: 06/02/1946   Medicare Important Message Given:  Yes     Ermine Spofford 06/10/2020, 2:04 PM

## 2020-06-10 NOTE — Progress Notes (Signed)
Report given to [facility]. 

## 2020-06-11 DIAGNOSIS — M4628 Osteomyelitis of vertebra, sacral and sacrococcygeal region: Secondary | ICD-10-CM | POA: Diagnosis not present

## 2020-06-11 DIAGNOSIS — Z933 Colostomy status: Secondary | ICD-10-CM | POA: Diagnosis not present

## 2020-06-11 DIAGNOSIS — G373 Acute transverse myelitis in demyelinating disease of central nervous system: Secondary | ICD-10-CM | POA: Diagnosis not present

## 2020-06-11 DIAGNOSIS — G8220 Paraplegia, unspecified: Secondary | ICD-10-CM | POA: Diagnosis not present

## 2020-06-16 DIAGNOSIS — R627 Adult failure to thrive: Secondary | ICD-10-CM | POA: Diagnosis not present

## 2020-06-16 DIAGNOSIS — E11622 Type 2 diabetes mellitus with other skin ulcer: Secondary | ICD-10-CM | POA: Diagnosis not present

## 2020-06-16 DIAGNOSIS — L98499 Non-pressure chronic ulcer of skin of other sites with unspecified severity: Secondary | ICD-10-CM | POA: Diagnosis not present

## 2020-06-16 DIAGNOSIS — L89154 Pressure ulcer of sacral region, stage 4: Secondary | ICD-10-CM | POA: Diagnosis not present

## 2020-06-16 DIAGNOSIS — G373 Acute transverse myelitis in demyelinating disease of central nervous system: Secondary | ICD-10-CM | POA: Diagnosis not present

## 2020-06-16 DIAGNOSIS — E08622 Diabetes mellitus due to underlying condition with other skin ulcer: Secondary | ICD-10-CM | POA: Diagnosis not present

## 2020-06-17 DIAGNOSIS — G373 Acute transverse myelitis in demyelinating disease of central nervous system: Secondary | ICD-10-CM | POA: Diagnosis not present

## 2020-06-17 DIAGNOSIS — K592 Neurogenic bowel, not elsewhere classified: Secondary | ICD-10-CM | POA: Diagnosis not present

## 2020-06-17 DIAGNOSIS — G8250 Quadriplegia, unspecified: Secondary | ICD-10-CM | POA: Diagnosis not present

## 2020-06-17 DIAGNOSIS — N319 Neuromuscular dysfunction of bladder, unspecified: Secondary | ICD-10-CM | POA: Diagnosis not present

## 2020-06-17 NOTE — Progress Notes (Deleted)
Cardiology Office Note    Date:  06/17/2020   ID:  Brent [name]., DOB [birth year], MRN [id number]  PCP:  [name], [name], MD  Cardiologist: Lauree Chandler, MD EPS: None  No chief complaint on file.   History of Present Illness:  Brent [name]. is a 74 y.o. male with a h/o CAD status post PTCA of his RCA in 1990, being MS to his RCA in 2000 with subsequent in-stent restenosis and underwent rotational atherectomy., diabetes, and HTN.  He was initially admitted with chest pain.  He was evaluated by cardiology and had low risk myoview.     The patient did have a spinal cord lesion which has caused weakness. There is concern that this could be cardioembolic in nature. The patient does not have a known history of afib. Echo with bubble study this admission revealed normal EF with negative bubble study   Dr. Rayann Heman saw the patient for ? Spinal cord infarct. He recommended 30 day monitor to rule out Afib   Past Medical History:  Diagnosis Date  . Allergy   . CAD (coronary artery disease)    a. angioplasty of his RCA in 1990. b. bare metal stent placed in the RCA in 2000 followed by rotational atherectomy shortly after for stent restenosis. c. last cath was in 2012 showing stable moderate diffuse CAD. (70% mid LAD, 80% diagonal, 70% Ramus, 40% mid to distal RCA stent restenosis). d. Low risk nuc in 2015.  . Diabetes mellitus   . Diverticulosis   . Elevated CK   . Erectile dysfunction   . Hemorrhoids   . HTN (hypertension)   . Hyperlipidemia   . Hypertriglyceridemia   . Malignant melanoma of left side of neck ([place]) 10/25/2018  . Myocardial infarction ([place])   . Obesity   . OSA (obstructive sleep apnea)   . Persistent disorder of initiating or maintaining sleep   . Personal history of colonic polyps 02/05/2003    Past Surgical History:  Procedure Laterality Date  . CHOLECYSTECTOMY    . CORONARY ANGIOPLASTY    . CORONARY STENT PLACEMENT     stenting of  the right coronary artery with followup rotational  atherectomy. (3 stents placed)  . FINGER SURGERY     right  . FOOT SURGERY     right  . INGUINAL HERNIA REPAIR     right  . IRRIGATION AND DEBRIDEMENT ABSCESS N/A 06/04/2020   Procedure: IRRIGATION AND DEBRIDEMENT SACRAL WOUND;  Surgeon: Jesusita Oka, MD;  Location: [facility];  Service: General;  Laterality: N/A;  . LAPAROSCOPY N/A 06/04/2020   Procedure: LAPAROSCOPY ASSISTED COLOSTOMY, LAPAROSCOPY GASTROSTOMY;  Surgeon: Jesusita Oka, MD;  Location: [facility];  Service: General;  Laterality: N/A;  . LYSIS OF ADHESION N/A 06/04/2020   Procedure: LYSIS OF ADHESION;  Surgeon: Jesusita Oka, MD;  Location: [facility];  Service: General;  Laterality: N/A;  . melanoma removal     neck  . ORTHOPEDIC SURGERY     foot right  . rotator cuff surg     Bil    Current Medications: No outpatient medications have been marked as taking for the 06/23/20 encounter (Appointment) with Imogene Burn, PA-C.     Allergies:   Levofloxacin and Penicillins   Social History   Socioeconomic History  . Marital status: Married    Spouse name: Not on file  . Number of children: 1  . Years of education: Not on file  .  Highest education level: Not on file  Occupational History  . Occupation: Teacher, English as a foreign language    Comment: [employer]  . Smoking status: Former Smoker    Packs/day: 1.50    Years: 30.00    Pack years: 45.00    Types: Cigarettes    Quit date: 09/19/1980    Years since quitting: 39.7  . Smokeless tobacco: Never Used  Vaping Use  . Vaping Use: Never used  Substance and Sexual Activity  . Alcohol use: Not Currently    Alcohol/week: 7.0 - 14.0 standard drinks    Types: 7 - 14 Shots of liquor per week  . Drug use: No  . Sexual activity: Not Currently    Partners: Female  Other Topics Concern  . Not on file  Social History Narrative   12/14/2018: Lives with wife and 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 (son had passed away several years ago)    Recevies much of his care through [place]   Enjoys golfing   Social Determinants of Health   Financial Resource Strain: Low Risk   . Difficulty of Paying Living Expenses: Not hard at all  Food Insecurity:   . Worried About Charity fundraiser in the Last Year: Not on file  . Ran Out of Food in the Last Year: Not on file  Transportation Needs: No Transportation Needs  . Lack of Transportation (Medical): No  . Lack of Transportation (Non-Medical): No  Physical Activity:   . Days of Exercise per Week: Not on file  . Minutes of Exercise per Session: Not on file  Stress:   . Feeling of Stress : Not on file  Social Connections:   . Frequency of Communication with Friends and Family: Not on file  . Frequency of Social Gatherings with Friends and Family: Not on file  . Attends Religious Services: Not on file  . Active Member of Clubs or Organizations: Not on file  . Attends Archivist Meetings: Not on file  . Marital Status: Not on file     Family History:  The patient's ***family history includes COPD in his mother; Heart disease in his father and sister; Liver cancer in his father; Lung cancer in his father and mother; Obesity in his mother.   ROS:   Please see the history of present illness.    ROS All other systems reviewed and are negative.   PHYSICAL EXAM:   VS:  There were no vitals taken for this visit.  Physical Exam  GEN: Well nourished, well developed, in no acute distress  HEENT: normal  Neck: no JVD, carotid bruits, or masses Cardiac:RRR; no murmurs, rubs, or gallops  Respiratory:  clear to auscultation bilaterally, normal work of breathing GI: soft, nontender, nondistended, + BS Ext: without cyanosis, clubbing, or edema, Good distal pulses bilaterally MS: no deformity or atrophy  Skin: warm and dry, no rash Neuro:  Alert and Oriented x 3, Strength and sensation are intact Psych: euthymic mood, full affect  Wt Readings from Last 3 Encounters:  06/10/20 271  lb (122.9 kg)  03/25/20 238 lb 15.7 oz (108.4 kg)  03/25/20 239 lb 6.7 oz (108.6 kg)      Studies/Labs Reviewed:   EKG:  EKG is*** ordered today.  The ekg ordered today demonstrates ***  Recent Labs: 06/01/2020: ALT 68 06/06/2020: TSH 4.252 06/09/2020: Magnesium 1.7 06/10/2020: BUN 11; Creatinine, Ser 0.31; Hemoglobin 7.7; Platelets 278; Potassium 4.2; Sodium 135   Lipid Panel    Component Value Date/Time  CHOL 135 01/20/2020 0754   TRIG 295 (H) 01/20/2020 0754   HDL 23 (L) 01/20/2020 0754   CHOLHDL 5.9 (H) 01/20/2020 0754   CHOLHDL 6 04/15/2016 1133   VLDL 54.8 (H) 03/18/2015 0833   LDLCALC 65 01/20/2020 0754   LDLDIRECT 68 01/20/2020 0754   LDLDIRECT 77.0 04/15/2016 1133    Additional studies/ records that were reviewed today include:  ***    ASSESSMENT:    No diagnosis found.   PLAN:  In order of problems listed above:      Medication Adjustments/Labs and Tests Ordered: Current medicines are reviewed at length with the patient today.  Concerns regarding medicines are outlined above.  Medication changes, Labs and Tests ordered today are listed in the Patient Instructions below. There are no Patient Instructions on file for this visit.   Sumner Boast, PA-C  06/17/2020 4:03 PM    [facility] [place], [place], [place]  [address] Phone: [phone]; Fax: [phone]

## 2020-06-18 ENCOUNTER — Telehealth: Payer: Self-pay

## 2020-06-18 DIAGNOSIS — Z933 Colostomy status: Secondary | ICD-10-CM | POA: Diagnosis not present

## 2020-06-18 DIAGNOSIS — M5489 Other dorsalgia: Secondary | ICD-10-CM | POA: Diagnosis not present

## 2020-06-18 DIAGNOSIS — K592 Neurogenic bowel, not elsewhere classified: Secondary | ICD-10-CM | POA: Diagnosis not present

## 2020-06-18 DIAGNOSIS — R7309 Other abnormal glucose: Secondary | ICD-10-CM | POA: Diagnosis not present

## 2020-06-18 DIAGNOSIS — M75101 Unspecified rotator cuff tear or rupture of right shoulder, not specified as traumatic: Secondary | ICD-10-CM | POA: Diagnosis not present

## 2020-06-18 DIAGNOSIS — E118 Type 2 diabetes mellitus with unspecified complications: Secondary | ICD-10-CM | POA: Diagnosis not present

## 2020-06-18 DIAGNOSIS — Z794 Long term (current) use of insulin: Secondary | ICD-10-CM | POA: Diagnosis not present

## 2020-06-18 NOTE — Telephone Encounter (Signed)
This pt is on Ermalinda Barrios schedule for hospital f/u. Per Dr Bonita Quin note he was to f/u with an EP Dr after wearing an Event Monitor. The monitor was never sent to the pt.  I called [facility] where he is currently at to speak with the RN to see if this appt was needed or if he needs to see an EP Dr or APP. I was on hold for over 5 minutes and had to hang up. I will call back again tomorrow.

## 2020-06-22 DIAGNOSIS — N319 Neuromuscular dysfunction of bladder, unspecified: Secondary | ICD-10-CM | POA: Diagnosis not present

## 2020-06-22 DIAGNOSIS — G8250 Quadriplegia, unspecified: Secondary | ICD-10-CM | POA: Diagnosis not present

## 2020-06-22 DIAGNOSIS — G4733 Obstructive sleep apnea (adult) (pediatric): Secondary | ICD-10-CM | POA: Diagnosis not present

## 2020-06-22 DIAGNOSIS — G373 Acute transverse myelitis in demyelinating disease of central nervous system: Secondary | ICD-10-CM | POA: Diagnosis not present

## 2020-06-22 DIAGNOSIS — I10 Essential (primary) hypertension: Secondary | ICD-10-CM | POA: Diagnosis not present

## 2020-06-22 DIAGNOSIS — E1142 Type 2 diabetes mellitus with diabetic polyneuropathy: Secondary | ICD-10-CM | POA: Diagnosis not present

## 2020-06-22 DIAGNOSIS — Z7409 Other reduced mobility: Secondary | ICD-10-CM | POA: Diagnosis not present

## 2020-06-22 DIAGNOSIS — E119 Type 2 diabetes mellitus without complications: Secondary | ICD-10-CM | POA: Diagnosis not present

## 2020-06-22 DIAGNOSIS — K592 Neurogenic bowel, not elsewhere classified: Secondary | ICD-10-CM | POA: Diagnosis not present

## 2020-06-22 DIAGNOSIS — I2510 Atherosclerotic heart disease of native coronary artery without angina pectoris: Secondary | ICD-10-CM | POA: Diagnosis not present

## 2020-06-22 DIAGNOSIS — E782 Mixed hyperlipidemia: Secondary | ICD-10-CM | POA: Diagnosis not present

## 2020-06-23 ENCOUNTER — Telehealth: Payer: Medicare Other | Admitting: Physician Assistant

## 2020-06-23 DIAGNOSIS — S31000D Unspecified open wound of lower back and pelvis without penetration into retroperitoneum, subsequent encounter: Secondary | ICD-10-CM | POA: Diagnosis not present

## 2020-06-24 DIAGNOSIS — I2510 Atherosclerotic heart disease of native coronary artery without angina pectoris: Secondary | ICD-10-CM | POA: Diagnosis not present

## 2020-06-24 DIAGNOSIS — Z7409 Other reduced mobility: Secondary | ICD-10-CM | POA: Diagnosis not present

## 2020-06-24 DIAGNOSIS — G373 Acute transverse myelitis in demyelinating disease of central nervous system: Secondary | ICD-10-CM | POA: Diagnosis not present

## 2020-06-24 DIAGNOSIS — E119 Type 2 diabetes mellitus without complications: Secondary | ICD-10-CM | POA: Diagnosis not present

## 2020-06-24 DIAGNOSIS — I10 Essential (primary) hypertension: Secondary | ICD-10-CM | POA: Diagnosis not present

## 2020-06-24 DIAGNOSIS — G4733 Obstructive sleep apnea (adult) (pediatric): Secondary | ICD-10-CM | POA: Diagnosis not present

## 2020-06-24 DIAGNOSIS — G8250 Quadriplegia, unspecified: Secondary | ICD-10-CM | POA: Diagnosis not present

## 2020-06-24 DIAGNOSIS — E1142 Type 2 diabetes mellitus with diabetic polyneuropathy: Secondary | ICD-10-CM | POA: Diagnosis not present

## 2020-06-24 DIAGNOSIS — E782 Mixed hyperlipidemia: Secondary | ICD-10-CM | POA: Diagnosis not present

## 2020-06-24 DIAGNOSIS — K592 Neurogenic bowel, not elsewhere classified: Secondary | ICD-10-CM | POA: Diagnosis not present

## 2020-06-24 DIAGNOSIS — N319 Neuromuscular dysfunction of bladder, unspecified: Secondary | ICD-10-CM | POA: Diagnosis not present

## 2020-06-25 DIAGNOSIS — G63 Polyneuropathy in diseases classified elsewhere: Secondary | ICD-10-CM | POA: Diagnosis not present

## 2020-06-25 DIAGNOSIS — E1165 Type 2 diabetes mellitus with hyperglycemia: Secondary | ICD-10-CM | POA: Diagnosis not present

## 2020-06-25 DIAGNOSIS — Z299 Encounter for prophylactic measures, unspecified: Secondary | ICD-10-CM | POA: Diagnosis not present

## 2020-06-25 DIAGNOSIS — Z931 Gastrostomy status: Secondary | ICD-10-CM | POA: Diagnosis not present

## 2020-06-26 DIAGNOSIS — N319 Neuromuscular dysfunction of bladder, unspecified: Secondary | ICD-10-CM | POA: Diagnosis not present

## 2020-06-26 DIAGNOSIS — I2510 Atherosclerotic heart disease of native coronary artery without angina pectoris: Secondary | ICD-10-CM | POA: Diagnosis not present

## 2020-06-26 DIAGNOSIS — E782 Mixed hyperlipidemia: Secondary | ICD-10-CM | POA: Diagnosis not present

## 2020-06-26 DIAGNOSIS — I10 Essential (primary) hypertension: Secondary | ICD-10-CM | POA: Diagnosis not present

## 2020-06-26 DIAGNOSIS — Z7409 Other reduced mobility: Secondary | ICD-10-CM | POA: Diagnosis not present

## 2020-06-26 DIAGNOSIS — G8250 Quadriplegia, unspecified: Secondary | ICD-10-CM | POA: Diagnosis not present

## 2020-06-26 DIAGNOSIS — E1142 Type 2 diabetes mellitus with diabetic polyneuropathy: Secondary | ICD-10-CM | POA: Diagnosis not present

## 2020-06-26 DIAGNOSIS — G373 Acute transverse myelitis in demyelinating disease of central nervous system: Secondary | ICD-10-CM | POA: Diagnosis not present

## 2020-06-26 DIAGNOSIS — K592 Neurogenic bowel, not elsewhere classified: Secondary | ICD-10-CM | POA: Diagnosis not present

## 2020-06-26 DIAGNOSIS — E119 Type 2 diabetes mellitus without complications: Secondary | ICD-10-CM | POA: Diagnosis not present

## 2020-06-26 DIAGNOSIS — G4733 Obstructive sleep apnea (adult) (pediatric): Secondary | ICD-10-CM | POA: Diagnosis not present

## 2020-06-29 DIAGNOSIS — I2510 Atherosclerotic heart disease of native coronary artery without angina pectoris: Secondary | ICD-10-CM | POA: Diagnosis not present

## 2020-06-29 DIAGNOSIS — E119 Type 2 diabetes mellitus without complications: Secondary | ICD-10-CM | POA: Diagnosis not present

## 2020-06-29 DIAGNOSIS — E782 Mixed hyperlipidemia: Secondary | ICD-10-CM | POA: Diagnosis not present

## 2020-06-29 DIAGNOSIS — I10 Essential (primary) hypertension: Secondary | ICD-10-CM | POA: Diagnosis not present

## 2020-06-29 DIAGNOSIS — N319 Neuromuscular dysfunction of bladder, unspecified: Secondary | ICD-10-CM | POA: Diagnosis not present

## 2020-06-29 DIAGNOSIS — Z7409 Other reduced mobility: Secondary | ICD-10-CM | POA: Diagnosis not present

## 2020-06-29 DIAGNOSIS — G373 Acute transverse myelitis in demyelinating disease of central nervous system: Secondary | ICD-10-CM | POA: Diagnosis not present

## 2020-06-29 DIAGNOSIS — G8250 Quadriplegia, unspecified: Secondary | ICD-10-CM | POA: Diagnosis not present

## 2020-06-29 DIAGNOSIS — K592 Neurogenic bowel, not elsewhere classified: Secondary | ICD-10-CM | POA: Diagnosis not present

## 2020-06-29 DIAGNOSIS — E1142 Type 2 diabetes mellitus with diabetic polyneuropathy: Secondary | ICD-10-CM | POA: Diagnosis not present

## 2020-06-29 DIAGNOSIS — G4733 Obstructive sleep apnea (adult) (pediatric): Secondary | ICD-10-CM | POA: Diagnosis not present

## 2020-06-30 ENCOUNTER — Other Ambulatory Visit: Payer: Self-pay | Admitting: *Deleted

## 2020-06-30 NOTE — Patient Outreach (Signed)
Member screened for potential [facility] Care Management needs as a benefit of [place] Medicare.  Per Patient Pearletha Forge member resides in [facility].   Communication sent to [place] to collaborate about anticipated dc plans and potential [facility] Care Management needs.  Will continue to follow while member resides in SNF.   Marthenia Rolling, MSN-Ed, RN,BSN [place] Acute Care Coordinator [phone] [facility]) [phone]  (Toll free office)

## 2020-07-01 ENCOUNTER — Other Ambulatory Visit: Payer: Self-pay | Admitting: *Deleted

## 2020-07-01 DIAGNOSIS — G4733 Obstructive sleep apnea (adult) (pediatric): Secondary | ICD-10-CM | POA: Diagnosis not present

## 2020-07-01 DIAGNOSIS — G8250 Quadriplegia, unspecified: Secondary | ICD-10-CM | POA: Diagnosis not present

## 2020-07-01 DIAGNOSIS — Z7409 Other reduced mobility: Secondary | ICD-10-CM | POA: Diagnosis not present

## 2020-07-01 DIAGNOSIS — G373 Acute transverse myelitis in demyelinating disease of central nervous system: Secondary | ICD-10-CM | POA: Diagnosis not present

## 2020-07-01 DIAGNOSIS — E782 Mixed hyperlipidemia: Secondary | ICD-10-CM | POA: Diagnosis not present

## 2020-07-01 DIAGNOSIS — K592 Neurogenic bowel, not elsewhere classified: Secondary | ICD-10-CM | POA: Diagnosis not present

## 2020-07-01 DIAGNOSIS — I2510 Atherosclerotic heart disease of native coronary artery without angina pectoris: Secondary | ICD-10-CM | POA: Diagnosis not present

## 2020-07-01 DIAGNOSIS — N319 Neuromuscular dysfunction of bladder, unspecified: Secondary | ICD-10-CM | POA: Diagnosis not present

## 2020-07-01 DIAGNOSIS — I10 Essential (primary) hypertension: Secondary | ICD-10-CM | POA: Diagnosis not present

## 2020-07-01 DIAGNOSIS — E1142 Type 2 diabetes mellitus with diabetic polyneuropathy: Secondary | ICD-10-CM | POA: Diagnosis not present

## 2020-07-01 DIAGNOSIS — E119 Type 2 diabetes mellitus without complications: Secondary | ICD-10-CM | POA: Diagnosis not present

## 2020-07-01 NOTE — Patient Outreach (Signed)
THN Post- Acute Care Coordinator follow up. Member screened for potential [facility] Care Management needs as a benefit of [place] Medicare.  Update received from [place] indicating member has extensive wounds. Transition plans are not clear at this point.  Will continue to follow while member resides in SNF.   Marthenia Rolling, MSN-Ed, RN,BSN [place] Acute Care Coordinator [phone] [facility]) [phone]  (Toll free office)

## 2020-07-02 DIAGNOSIS — I10 Essential (primary) hypertension: Secondary | ICD-10-CM | POA: Diagnosis not present

## 2020-07-02 DIAGNOSIS — L89620 Pressure ulcer of left heel, unstageable: Secondary | ICD-10-CM | POA: Diagnosis not present

## 2020-07-02 DIAGNOSIS — L89310 Pressure ulcer of right buttock, unstageable: Secondary | ICD-10-CM | POA: Diagnosis not present

## 2020-07-02 DIAGNOSIS — L89154 Pressure ulcer of sacral region, stage 4: Secondary | ICD-10-CM | POA: Diagnosis not present

## 2020-07-02 DIAGNOSIS — L89610 Pressure ulcer of right heel, unstageable: Secondary | ICD-10-CM | POA: Diagnosis not present

## 2020-07-02 DIAGNOSIS — E782 Mixed hyperlipidemia: Secondary | ICD-10-CM | POA: Diagnosis not present

## 2020-07-02 DIAGNOSIS — E1142 Type 2 diabetes mellitus with diabetic polyneuropathy: Secondary | ICD-10-CM | POA: Diagnosis not present

## 2020-07-02 DIAGNOSIS — Z7409 Other reduced mobility: Secondary | ICD-10-CM | POA: Diagnosis not present

## 2020-07-02 DIAGNOSIS — N319 Neuromuscular dysfunction of bladder, unspecified: Secondary | ICD-10-CM | POA: Diagnosis not present

## 2020-07-02 DIAGNOSIS — G4733 Obstructive sleep apnea (adult) (pediatric): Secondary | ICD-10-CM | POA: Diagnosis not present

## 2020-07-02 DIAGNOSIS — G373 Acute transverse myelitis in demyelinating disease of central nervous system: Secondary | ICD-10-CM | POA: Diagnosis not present

## 2020-07-02 DIAGNOSIS — E119 Type 2 diabetes mellitus without complications: Secondary | ICD-10-CM | POA: Diagnosis not present

## 2020-07-02 DIAGNOSIS — D6489 Other specified anemias: Secondary | ICD-10-CM | POA: Diagnosis not present

## 2020-07-02 DIAGNOSIS — E440 Moderate protein-calorie malnutrition: Secondary | ICD-10-CM | POA: Diagnosis not present

## 2020-07-02 DIAGNOSIS — R532 Functional quadriplegia: Secondary | ICD-10-CM | POA: Diagnosis not present

## 2020-07-02 DIAGNOSIS — E871 Hypo-osmolality and hyponatremia: Secondary | ICD-10-CM | POA: Diagnosis not present

## 2020-07-02 DIAGNOSIS — E1165 Type 2 diabetes mellitus with hyperglycemia: Secondary | ICD-10-CM | POA: Diagnosis not present

## 2020-07-02 DIAGNOSIS — I2510 Atherosclerotic heart disease of native coronary artery without angina pectoris: Secondary | ICD-10-CM | POA: Diagnosis not present

## 2020-07-02 DIAGNOSIS — K592 Neurogenic bowel, not elsewhere classified: Secondary | ICD-10-CM | POA: Diagnosis not present

## 2020-07-02 DIAGNOSIS — G8250 Quadriplegia, unspecified: Secondary | ICD-10-CM | POA: Diagnosis not present

## 2020-07-06 DIAGNOSIS — I10 Essential (primary) hypertension: Secondary | ICD-10-CM | POA: Diagnosis not present

## 2020-07-06 DIAGNOSIS — G373 Acute transverse myelitis in demyelinating disease of central nervous system: Secondary | ICD-10-CM | POA: Diagnosis not present

## 2020-07-06 DIAGNOSIS — N319 Neuromuscular dysfunction of bladder, unspecified: Secondary | ICD-10-CM | POA: Diagnosis not present

## 2020-07-06 DIAGNOSIS — E119 Type 2 diabetes mellitus without complications: Secondary | ICD-10-CM | POA: Diagnosis not present

## 2020-07-06 DIAGNOSIS — Z7409 Other reduced mobility: Secondary | ICD-10-CM | POA: Diagnosis not present

## 2020-07-06 DIAGNOSIS — K592 Neurogenic bowel, not elsewhere classified: Secondary | ICD-10-CM | POA: Diagnosis not present

## 2020-07-06 DIAGNOSIS — I2510 Atherosclerotic heart disease of native coronary artery without angina pectoris: Secondary | ICD-10-CM | POA: Diagnosis not present

## 2020-07-06 DIAGNOSIS — G4733 Obstructive sleep apnea (adult) (pediatric): Secondary | ICD-10-CM | POA: Diagnosis not present

## 2020-07-06 DIAGNOSIS — G8250 Quadriplegia, unspecified: Secondary | ICD-10-CM | POA: Diagnosis not present

## 2020-07-06 DIAGNOSIS — E1142 Type 2 diabetes mellitus with diabetic polyneuropathy: Secondary | ICD-10-CM | POA: Diagnosis not present

## 2020-07-06 DIAGNOSIS — E782 Mixed hyperlipidemia: Secondary | ICD-10-CM | POA: Diagnosis not present

## 2020-07-07 DIAGNOSIS — G373 Acute transverse myelitis in demyelinating disease of central nervous system: Secondary | ICD-10-CM | POA: Diagnosis not present

## 2020-07-07 DIAGNOSIS — L89154 Pressure ulcer of sacral region, stage 4: Secondary | ICD-10-CM | POA: Diagnosis not present

## 2020-07-07 DIAGNOSIS — E11622 Type 2 diabetes mellitus with other skin ulcer: Secondary | ICD-10-CM | POA: Diagnosis not present

## 2020-07-10 DIAGNOSIS — L89154 Pressure ulcer of sacral region, stage 4: Secondary | ICD-10-CM | POA: Diagnosis not present

## 2020-07-10 DIAGNOSIS — K592 Neurogenic bowel, not elsewhere classified: Secondary | ICD-10-CM | POA: Diagnosis not present

## 2020-07-10 DIAGNOSIS — L89310 Pressure ulcer of right buttock, unstageable: Secondary | ICD-10-CM | POA: Diagnosis not present

## 2020-07-10 DIAGNOSIS — L89610 Pressure ulcer of right heel, unstageable: Secondary | ICD-10-CM | POA: Diagnosis not present

## 2020-07-10 DIAGNOSIS — G373 Acute transverse myelitis in demyelinating disease of central nervous system: Secondary | ICD-10-CM | POA: Diagnosis not present

## 2020-07-10 DIAGNOSIS — L89510 Pressure ulcer of right ankle, unstageable: Secondary | ICD-10-CM | POA: Diagnosis not present

## 2020-07-10 DIAGNOSIS — N318 Other neuromuscular dysfunction of bladder: Secondary | ICD-10-CM | POA: Diagnosis not present

## 2020-07-10 DIAGNOSIS — E118 Type 2 diabetes mellitus with unspecified complications: Secondary | ICD-10-CM | POA: Diagnosis not present

## 2020-07-10 DIAGNOSIS — K1230 Oral mucositis (ulcerative), unspecified: Secondary | ICD-10-CM | POA: Diagnosis not present

## 2020-07-10 DIAGNOSIS — G8220 Paraplegia, unspecified: Secondary | ICD-10-CM | POA: Diagnosis not present

## 2020-07-10 DIAGNOSIS — M868X8 Other osteomyelitis, other site: Secondary | ICD-10-CM | POA: Diagnosis not present

## 2020-07-10 DIAGNOSIS — R1319 Other dysphagia: Secondary | ICD-10-CM | POA: Diagnosis not present

## 2020-07-10 DIAGNOSIS — I10 Essential (primary) hypertension: Secondary | ICD-10-CM | POA: Diagnosis not present

## 2020-07-10 DIAGNOSIS — L89156 Pressure-induced deep tissue damage of sacral region: Secondary | ICD-10-CM | POA: Diagnosis not present

## 2020-07-13 DIAGNOSIS — Z7409 Other reduced mobility: Secondary | ICD-10-CM | POA: Diagnosis not present

## 2020-07-13 DIAGNOSIS — G373 Acute transverse myelitis in demyelinating disease of central nervous system: Secondary | ICD-10-CM | POA: Diagnosis not present

## 2020-07-13 DIAGNOSIS — E1142 Type 2 diabetes mellitus with diabetic polyneuropathy: Secondary | ICD-10-CM | POA: Diagnosis not present

## 2020-07-13 DIAGNOSIS — I10 Essential (primary) hypertension: Secondary | ICD-10-CM | POA: Diagnosis not present

## 2020-07-13 DIAGNOSIS — E119 Type 2 diabetes mellitus without complications: Secondary | ICD-10-CM | POA: Diagnosis not present

## 2020-07-13 DIAGNOSIS — I2510 Atherosclerotic heart disease of native coronary artery without angina pectoris: Secondary | ICD-10-CM | POA: Diagnosis not present

## 2020-07-13 DIAGNOSIS — K592 Neurogenic bowel, not elsewhere classified: Secondary | ICD-10-CM | POA: Diagnosis not present

## 2020-07-13 DIAGNOSIS — N319 Neuromuscular dysfunction of bladder, unspecified: Secondary | ICD-10-CM | POA: Diagnosis not present

## 2020-07-13 DIAGNOSIS — E782 Mixed hyperlipidemia: Secondary | ICD-10-CM | POA: Diagnosis not present

## 2020-07-13 DIAGNOSIS — G8250 Quadriplegia, unspecified: Secondary | ICD-10-CM | POA: Diagnosis not present

## 2020-07-13 DIAGNOSIS — G4733 Obstructive sleep apnea (adult) (pediatric): Secondary | ICD-10-CM | POA: Diagnosis not present

## 2020-07-14 DIAGNOSIS — L89154 Pressure ulcer of sacral region, stage 4: Secondary | ICD-10-CM | POA: Diagnosis not present

## 2020-07-14 DIAGNOSIS — I10 Essential (primary) hypertension: Secondary | ICD-10-CM | POA: Diagnosis not present

## 2020-07-14 DIAGNOSIS — Z931 Gastrostomy status: Secondary | ICD-10-CM | POA: Diagnosis not present

## 2020-07-15 DIAGNOSIS — E119 Type 2 diabetes mellitus without complications: Secondary | ICD-10-CM | POA: Diagnosis not present

## 2020-07-15 DIAGNOSIS — G4733 Obstructive sleep apnea (adult) (pediatric): Secondary | ICD-10-CM | POA: Diagnosis not present

## 2020-07-15 DIAGNOSIS — I10 Essential (primary) hypertension: Secondary | ICD-10-CM | POA: Diagnosis not present

## 2020-07-15 DIAGNOSIS — G373 Acute transverse myelitis in demyelinating disease of central nervous system: Secondary | ICD-10-CM | POA: Diagnosis not present

## 2020-07-15 DIAGNOSIS — K592 Neurogenic bowel, not elsewhere classified: Secondary | ICD-10-CM | POA: Diagnosis not present

## 2020-07-15 DIAGNOSIS — Z7409 Other reduced mobility: Secondary | ICD-10-CM | POA: Diagnosis not present

## 2020-07-15 DIAGNOSIS — G8250 Quadriplegia, unspecified: Secondary | ICD-10-CM | POA: Diagnosis not present

## 2020-07-15 DIAGNOSIS — E782 Mixed hyperlipidemia: Secondary | ICD-10-CM | POA: Diagnosis not present

## 2020-07-15 DIAGNOSIS — N319 Neuromuscular dysfunction of bladder, unspecified: Secondary | ICD-10-CM | POA: Diagnosis not present

## 2020-07-15 DIAGNOSIS — I2510 Atherosclerotic heart disease of native coronary artery without angina pectoris: Secondary | ICD-10-CM | POA: Diagnosis not present

## 2020-07-15 DIAGNOSIS — E1142 Type 2 diabetes mellitus with diabetic polyneuropathy: Secondary | ICD-10-CM | POA: Diagnosis not present

## 2020-07-16 ENCOUNTER — Ambulatory Visit: Payer: Medicare Other | Admitting: Internal Medicine

## 2020-07-20 DIAGNOSIS — K592 Neurogenic bowel, not elsewhere classified: Secondary | ICD-10-CM | POA: Diagnosis not present

## 2020-07-20 DIAGNOSIS — I2510 Atherosclerotic heart disease of native coronary artery without angina pectoris: Secondary | ICD-10-CM | POA: Diagnosis not present

## 2020-07-20 DIAGNOSIS — G4733 Obstructive sleep apnea (adult) (pediatric): Secondary | ICD-10-CM | POA: Diagnosis not present

## 2020-07-20 DIAGNOSIS — Z7409 Other reduced mobility: Secondary | ICD-10-CM | POA: Diagnosis not present

## 2020-07-20 DIAGNOSIS — E782 Mixed hyperlipidemia: Secondary | ICD-10-CM | POA: Diagnosis not present

## 2020-07-20 DIAGNOSIS — N319 Neuromuscular dysfunction of bladder, unspecified: Secondary | ICD-10-CM | POA: Diagnosis not present

## 2020-07-20 DIAGNOSIS — E119 Type 2 diabetes mellitus without complications: Secondary | ICD-10-CM | POA: Diagnosis not present

## 2020-07-20 DIAGNOSIS — G8250 Quadriplegia, unspecified: Secondary | ICD-10-CM | POA: Diagnosis not present

## 2020-07-20 DIAGNOSIS — G373 Acute transverse myelitis in demyelinating disease of central nervous system: Secondary | ICD-10-CM | POA: Diagnosis not present

## 2020-07-20 DIAGNOSIS — E1142 Type 2 diabetes mellitus with diabetic polyneuropathy: Secondary | ICD-10-CM | POA: Diagnosis not present

## 2020-07-20 DIAGNOSIS — I10 Essential (primary) hypertension: Secondary | ICD-10-CM | POA: Diagnosis not present

## 2020-07-21 ENCOUNTER — Other Ambulatory Visit: Payer: Self-pay | Admitting: *Deleted

## 2020-07-21 DIAGNOSIS — E118 Type 2 diabetes mellitus with unspecified complications: Secondary | ICD-10-CM | POA: Diagnosis not present

## 2020-07-21 DIAGNOSIS — L89310 Pressure ulcer of right buttock, unstageable: Secondary | ICD-10-CM | POA: Diagnosis not present

## 2020-07-21 DIAGNOSIS — L89510 Pressure ulcer of right ankle, unstageable: Secondary | ICD-10-CM | POA: Diagnosis not present

## 2020-07-21 DIAGNOSIS — I10 Essential (primary) hypertension: Secondary | ICD-10-CM | POA: Diagnosis not present

## 2020-07-21 DIAGNOSIS — M4628 Osteomyelitis of vertebra, sacral and sacrococcygeal region: Secondary | ICD-10-CM | POA: Diagnosis not present

## 2020-07-21 DIAGNOSIS — L89610 Pressure ulcer of right heel, unstageable: Secondary | ICD-10-CM | POA: Diagnosis not present

## 2020-07-21 DIAGNOSIS — L89156 Pressure-induced deep tissue damage of sacral region: Secondary | ICD-10-CM | POA: Diagnosis not present

## 2020-07-21 DIAGNOSIS — L89154 Pressure ulcer of sacral region, stage 4: Secondary | ICD-10-CM | POA: Diagnosis not present

## 2020-07-21 DIAGNOSIS — G8220 Paraplegia, unspecified: Secondary | ICD-10-CM | POA: Diagnosis not present

## 2020-07-21 NOTE — Patient Outreach (Signed)
THN Post- Acute Care Coordinator follow up. Member screened for potential [facility] Care Management needs as a benefit of [place] Medicare.  Verified in Patient Brent [name] that member resides in [facility].   Communication sent to SNF SW to inquire about transition plans and potential [facility] Care Management needs.   Will continue to follow while member resides in SNF.    Marthenia Rolling, MSN-Ed, RN,BSN [place] Acute Care Coordinator [phone] [facility])

## 2020-07-22 ENCOUNTER — Other Ambulatory Visit: Payer: Self-pay | Admitting: *Deleted

## 2020-07-22 DIAGNOSIS — E119 Type 2 diabetes mellitus without complications: Secondary | ICD-10-CM | POA: Diagnosis not present

## 2020-07-22 DIAGNOSIS — I2510 Atherosclerotic heart disease of native coronary artery without angina pectoris: Secondary | ICD-10-CM | POA: Diagnosis not present

## 2020-07-22 DIAGNOSIS — E782 Mixed hyperlipidemia: Secondary | ICD-10-CM | POA: Diagnosis not present

## 2020-07-22 DIAGNOSIS — G8250 Quadriplegia, unspecified: Secondary | ICD-10-CM | POA: Diagnosis not present

## 2020-07-22 DIAGNOSIS — Z7409 Other reduced mobility: Secondary | ICD-10-CM | POA: Diagnosis not present

## 2020-07-22 DIAGNOSIS — G373 Acute transverse myelitis in demyelinating disease of central nervous system: Secondary | ICD-10-CM | POA: Diagnosis not present

## 2020-07-22 DIAGNOSIS — E1142 Type 2 diabetes mellitus with diabetic polyneuropathy: Secondary | ICD-10-CM | POA: Diagnosis not present

## 2020-07-22 DIAGNOSIS — K592 Neurogenic bowel, not elsewhere classified: Secondary | ICD-10-CM | POA: Diagnosis not present

## 2020-07-22 DIAGNOSIS — N319 Neuromuscular dysfunction of bladder, unspecified: Secondary | ICD-10-CM | POA: Diagnosis not present

## 2020-07-22 DIAGNOSIS — G4733 Obstructive sleep apnea (adult) (pediatric): Secondary | ICD-10-CM | POA: Diagnosis not present

## 2020-07-22 DIAGNOSIS — I10 Essential (primary) hypertension: Secondary | ICD-10-CM | POA: Diagnosis not present

## 2020-07-22 NOTE — Patient Outreach (Signed)
THN Post- Acute Care Coordinator follow up. Member screened for potential [facility] Care Management needs as a benefit of [place] Medicare.  Brent [name] is receiving skilled therapy at [facility]. Update received from SNF SW indicating member still receiving therapy. Care plan meeting scheduled for next week.  Will continue to follow for transition plans and for potential [facility] Care Management services.    Marthenia Rolling, MSN-Ed, RN,BSN [place] Acute Care Coordinator [phone] [facility]) [phone]  (Toll free office)

## 2020-07-23 ENCOUNTER — Ambulatory Visit (INDEPENDENT_AMBULATORY_CARE_PROVIDER_SITE_OTHER): Payer: Medicare Other | Admitting: Internal Medicine

## 2020-07-23 ENCOUNTER — Other Ambulatory Visit: Payer: Self-pay

## 2020-07-23 DIAGNOSIS — L0231 Cutaneous abscess of buttock: Secondary | ICD-10-CM | POA: Diagnosis not present

## 2020-07-23 DIAGNOSIS — I2510 Atherosclerotic heart disease of native coronary artery without angina pectoris: Secondary | ICD-10-CM

## 2020-07-23 NOTE — Progress Notes (Signed)
[facility]  Patient Active Problem List   Diagnosis Date Noted  . Psoas abscess, right ([place]) 06/05/2020    Priority: High  . Gram-negative bacteremia 06/05/2020    Priority: High  . Lumbar discitis 06/02/2020    Priority: High  . Abscess, gluteal, left 06/02/2020    Priority: High  . Sacral ulcer, with necrosis of muscle ([place]) 04/09/2020    Priority: High  . Malnutrition of moderate degree 06/04/2020  . Palliative care by specialist   . Goals of care, counseling/discussion   . FTT (failure to thrive) in adult 06/01/2020  . Abdominal pain   . E. coli UTI   . 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 Transverse myelitis ([place])   . Labile blood pressure   . Labile blood glucose   . Neurogenic bladder   . Neurogenic bowel   . Acute blood loss anemia   . Transaminitis   . Controlled type 2 diabetes mellitus with hyperglycemia, with long-term current use of insulin ([place])   . Spinal cord infarction ([place]) 03/25/2020  . Quadriplegia and quadriparesis ([place])   . Dyslipidemia   . HCAP (healthcare-associated pneumonia)   . Acute on chronic respiratory failure with hypoxia ([place])   . Acute hypoxemic respiratory failure ([place])   . Quadriplegia ([place])   . Coronary artery disease involving native coronary artery of native heart without angina pectoris   . Steroid-induced hyperglycemia   . Diabetic peripheral neuropathy ([place])   . Tachypnea   . Hyponatremia   . AKI (acute kidney injury) ([place])   . Weakness 03/09/2020  . Chest pain 03/08/2020  . Right leg numbness   . Malignant melanoma of left side of neck ([place]) 10/25/2018  . Facial neuritis 10/25/2018  . Myofascial pain syndrome 10/25/2018  . Occipital neuralgia of left side 10/25/2018  . CAP (community acquired pneumonia) 09/08/2017  . Viral URI with cough 08/02/2017  . Male hypogonadism 06/20/2016  . Renal lesion 06/20/2016  . Insomnia 08/07/2015  . Enlarged prostate with lower urinary tract symptoms  (LUTS) 11/07/2014  . Balanitis 07/30/2012  . Bladder neck obstruction 07/30/2012  . Prostate nodule 06/18/2012  . Recurrent nephrolithiasis 06/18/2012  . Urinary urgency 06/18/2012  . Benign neoplasm of colon 08/29/2011  . DEPRESSION, SITUATIONAL, ACUTE 01/23/2010  . Essential hypertension 05/30/2009  . Coronary atherosclerosis 05/30/2009  . Obstructive sleep apnea 11/28/2008  . OBESITY 09/23/2008  . ERECTILE DYSFUNCTION 11/26/2007  . Well controlled type 2 diabetes mellitus with peripheral circulatory disorder ([place]) 05/09/2007  . Hyperlipidemia 05/09/2007  . MYOCARDIAL INFARCTION, HX OF 05/09/2007  . DIVERTICULOSIS, COLON 05/09/2007    Patient's Medications  New Prescriptions   No medications on file  Previous Medications   ACETAMINOPHEN (TYLENOL) 325 MG TABLET    Take 2 tablets (650 mg total) by mouth every 6 (six) hours as needed for headache.   ALBUTEROL (PROVENTIL) (2.5 MG/3ML) 0.083% NEBULIZER SOLUTION    Take 3 mLs (2.5 mg total) by nebulization once as needed for wheezing or shortness of breath (or anaphylaxis due to Rituxan infusion).   ANTISEPTIC ORAL RINSE (BIOTENE) LIQD    10 mLs by Mouth Rinse route 2 (two) times daily. Rinse and spit   ASCORBIC ACID (VITAMIN C PO)    Take 500 mg by mouth daily.   ASPIRIN EC 81 MG TABLET    Take 81 mg by mouth at bedtime. Swallow whole.   ATORVASTATIN (LIPITOR) 20 MG TABLET    Take 1  tablet (20 mg total) by mouth daily.   BISACODYL (DULCOLAX) 10 MG SUPPOSITORY    Place 10 mg rectally daily as needed for moderate constipation.   CHOLESTYRAMINE (QUESTRAN) 4 G PACKET    Take 4 g by mouth daily.   COLLAGENASE (SANTYL) OINTMENT    Cleanse sacral area and then apply a layer of santyl with damp to dry dressing. Change daily and prn if soiled   DICLOFENAC SODIUM (VOLTAREN) 1 % GEL    Apply 2 g topically 2 (two) times daily. Apply to Right shoulder   FLUTICASONE (FLONASE) 50 MCG/ACT NASAL SPRAY    Place 2 sprays into both nostrils 2 (two) times  daily.   GABAPENTIN (NEURONTIN) 100 MG CAPSULE    Take 1 capsule (100 mg total) by mouth daily. Give in The Morning, per tube   GABAPENTIN (NEURONTIN) 300 MG CAPSULE    Place 1 capsule (300 mg total) into feeding tube at bedtime.   ICOSAPENT ETHYL (VASCEPA) 1 G CAPS    Take 2 capsules (2 g total) by mouth 2 (two) times daily.   INSULIN ASPART (NOVOLOG) 100 UNIT/ML INJECTION    Inject 0-15 Units into the skin every 4 (four) hours. Sliding scale  CBG 70 - 120: 0 units: CBG 121 - 150: 2 units; CBG 151 - 200: 3 units; CBG 201 - 250: 5 units; CBG 251 - 300: 8 units;CBG 301 - 350: 11 units; CBG 351 - 400: 15 units; CBG > 400 : 15 units and notify MD   INSULIN GLARGINE (LANTUS) 100 UNIT/ML SOPN    Inject 15 Units into the skin at bedtime.   LIDOCAINE (LIDODERM) 5 %    Place 1 patch onto the skin daily. Apply to right hip at 8 am and remove at 8 pm daily.   MENTHOL, TOPICAL ANALGESIC, (BIOFREEZE) 4 % GEL    Apply 1 application topically 2 (two) times daily.    MULTIPLE VITAMIN (MULTIVITAMIN WITH MINERALS) TABS TABLET    Place 1 tablet into feeding tube daily. Centrum Silver Men's   NUTRITIONAL SUPPLEMENTS (FEEDING SUPPLEMENT, OSMOLITE 1.5 CAL,) LIQD    Place 1,000 mLs into feeding tube continuous.   NUTRITIONAL SUPPLEMENTS (FEEDING SUPPLEMENT, PROSOURCE TF,) LIQUID    Place 90 mLs into feeding tube 2 (two) times daily.   NYSTATIN (MYCOSTATIN/NYSTOP) POWDER    Apply 1 application topically 3 (three) times daily.   ONDANSETRON (ZOFRAN-ODT) 4 MG DISINTEGRATING TABLET    Take 1 tablet (4 mg total) by mouth 2 (two) times daily. X 21 days   OXYBUTYNIN (DITROPAN) 5 MG TABLET    Place 1 tablet (5 mg total) into feeding tube daily.   PANTOPRAZOLE SODIUM (PROTONIX) 40 MG/20 ML PACK    Place 20 mLs (40 mg total) into feeding tube daily.   SACCHAROMYCES BOULARDII (FLORASTOR) 250 MG CAPSULE    Place 1 capsule (250 mg total) into feeding tube 2 (two) times daily.   SERTRALINE (ZOLOFT) 100 MG TABLET    Take 150 mg by mouth  at bedtime.    TAMSULOSIN (FLOMAX) 0.4 MG CAPS CAPSULE    Take 1 capsule (0.4 mg total) by mouth daily after supper.   TRAMADOL (ULTRAM) 50 MG TABLET    Take 1 tablet (50 mg total) by mouth every 12 (twelve) hours as needed for moderate pain.   VITAMIN B-12 (CYANOCOBALAMIN) 500 MCG TABLET    Take 500 mcg by mouth daily.   WATER FOR IRRIGATION, STERILE (FREE WATER) SOLN    Place 150  mLs into feeding tube every 4 (four) hours.   ZINC SULFATE 220 (50 ZN) MG CAPSULE    Place 1 capsule (220 mg total) into feeding tube daily.  Modified Medications   No medications on file  Discontinued Medications   AMPICILLIN-SULBACTAM (UNASYN) IVPB    Inject 3 g into the vein every 6 (six) hours. Indication:  Sacral wound infection First Dose: No Last Day of Therapy:  07/17/20 Labs - Once weekly:  CBC/D and BMP, Labs - Every other week:  ESR and CRP Method of administration: Mini-Bag Plus / Gravity Method of administration may be changed at the discretion of home infusion pharmacist based upon assessment of the patient and/or caregiver's ability to self-administer the medication ordered.    Subjective: Mr. Sudano is in for his hospital follow-up visit.  He is a 74 y.o. male who developed transverse myelitis and lower extremity paraplegia in July.  He had a lengthy hospital stay before being transferred to a skilled nursing facility.  He was suffering from very dry mouth and anorexia.  He had dramatic weight loss and has developed a large stage IV sacral pressure ulcer.  He was admitted in September because of failure to thrive.  He was afebrile.  MRI revealed a left gluteal abscess adjacent to his wound and L4-5 osteomyelitis with a right psoas abscess. Admission blood cultures have grown Proteus.  A diverting colostomy was performed.  He also had a feeding tube placed.     A PICC was placed and he was discharged on ampicillin sulbactam.  He completed 6 weeks of therapy on 07/17/2020.  He did not have any problems  tolerating his PICC or antibiotic.  He is feeling much better.  He says that he has gained about 20 pounds.  His nurses are turning him frequently to offload pressure from his wound.  He still has a wound VAC on it is being changed several times each week.  He had some wound debridement last week followed by some transient bleeding.  He is also being seen by Dr. Welton Flakes at the wound center at [facility].  Both he and his wife tell me that the wound is getting smaller and looking much better.  Review of Systems: Review of Systems  Constitutional: Negative for chills, diaphoresis, fever and weight loss.  Gastrointestinal: Positive for nausea. Negative for abdominal pain and vomiting.    Past Medical History:  Diagnosis Date  . Allergy   . CAD (coronary artery disease)    a. angioplasty of his RCA in 1990. b. bare metal stent placed in the RCA in 2000 followed by rotational atherectomy shortly after for stent restenosis. c. last cath was in 2012 showing stable moderate diffuse CAD. (70% mid LAD, 80% diagonal, 70% Ramus, 40% mid to distal RCA stent restenosis). d. Low risk nuc in 2015.  . Diabetes mellitus   . Diverticulosis   . Elevated CK   . Erectile dysfunction   . Hemorrhoids   . HTN (hypertension)   . Hyperlipidemia   . Hypertriglyceridemia   . Malignant melanoma of left side of neck ([place]) 10/25/2018  . Myocardial infarction ([place])   . Obesity   . OSA (obstructive sleep apnea)   . Persistent disorder of initiating or maintaining sleep   . Personal history of colonic polyps 02/05/2003    Social History   Tobacco Use  . Smoking status: Former Smoker    Packs/day: 1.50    Years: 30.00    Pack years: 45.00  Types: Cigarettes    Quit date: 09/19/1980    Years since quitting: 39.8  . Smokeless tobacco: Never Used  Vaping Use  . Vaping Use: Never used  Substance Use Topics  . Alcohol use: Not Currently    Alcohol/week: 7.0 - 14.0 standard drinks    Types: 7 - 14 Shots of liquor  per week  . Drug use: No    Family History  Problem Relation Age of Onset  . Liver cancer Father   . Lung cancer Father   . Heart disease Father   . Heart disease Sister   . Lung cancer Mother   . COPD Mother   . Obesity Mother   . Colon cancer Neg Hx   . Stomach cancer Neg Hx   . Pancreatic cancer Neg Hx     Allergies  Allergen Reactions  . Levofloxacin Rash  . Penicillins Hives and Rash    Tolerated Unasyn, cefepime and cephalexin    Objective: Vitals:   07/23/20 0959  BP: 129/66  Pulse: 88   There is no height or weight on file to calculate BMI.  Physical Exam Constitutional:      Comments: He looks much better and stronger than when I saw him in the hospital.  He is in very good spirits.  Cardiovascular:     Rate and Rhythm: Normal rate and regular rhythm.     Heart sounds: No murmur heard.   Pulmonary:     Effort: Pulmonary effort is normal.     Breath sounds: Normal breath sounds.  Abdominal:     Palpations: Abdomen is soft.     Tenderness: There is no abdominal tenderness.     Comments: He has a left-sided PEG and colostomy.  Skin:    Comments: He has a little bit of dried blood around his right arm PICC site but otherwise it looks good.     Lab Results No recent labs available to me   Problem List Items Addressed This Visit      High   Abscess, gluteal, left    It appears that he is greatly improved on therapy for his gluteal abscess complicated by bacteremia and lumbar discitis.  I will check CBC, BMP and inflammatory markers today and have his PICC removed.  I do not need to see him back unless he develops signs of recurrent infection.      Relevant Orders   CBC   Basic metabolic panel   C-reactive protein   Sedimentation rate   CBC   Basic metabolic panel   C-reactive protein   Sedimentation rate       Brent Bickers, MD [facility] [phone] pager   [phone] cell 07/23/2020,  10:24 AM

## 2020-07-23 NOTE — Assessment & Plan Note (Signed)
It appears that he is greatly improved on therapy for his gluteal abscess complicated by bacteremia and lumbar discitis.  I will check CBC, BMP and inflammatory markers today and have his PICC removed.  I do not need to see him back unless he develops signs of recurrent infection.

## 2020-07-23 NOTE — Progress Notes (Signed)
Per verbal order from Dr Megan Salon, 40 cm Single Lumen Peripherally Inserted Central Catheter removed from right basilic, tip intact. No sutures present. RN confirmed length per chart. Prior to removal dressing was undated and missing CHG sponge at insertion site. There was dried blood present under dressing at insertion site that the patient reported was new. Labs were drawn prior to removal and line was flushed. Dressing was clean and dry. Petroleum dressing applied. Pt advised no heavy lifting with this arm, leave dressing for 24 hours and call the office or seek emergent care if dressing becomes soaked with blood or swelling or sharp pain presents. Patient verbalized understanding and agreement. RN reported that she would contact facility to go over care instructions and inform them line has been removed. VM left at [facility] requesting call back to discuss care.   Patient's questions answered to their satisfaction. Patient tolerated procedure well.  Berdine Rasmusson Lorita Officer, RN

## 2020-07-23 NOTE — Addendum Note (Signed)
Addended by: Caffie Pinto on: 07/23/2020 10:53 AM   Modules accepted: Orders

## 2020-07-24 DIAGNOSIS — E1142 Type 2 diabetes mellitus with diabetic polyneuropathy: Secondary | ICD-10-CM | POA: Diagnosis not present

## 2020-07-24 DIAGNOSIS — K592 Neurogenic bowel, not elsewhere classified: Secondary | ICD-10-CM | POA: Diagnosis not present

## 2020-07-24 DIAGNOSIS — Z7409 Other reduced mobility: Secondary | ICD-10-CM | POA: Diagnosis not present

## 2020-07-24 DIAGNOSIS — G373 Acute transverse myelitis in demyelinating disease of central nervous system: Secondary | ICD-10-CM | POA: Diagnosis not present

## 2020-07-24 DIAGNOSIS — N319 Neuromuscular dysfunction of bladder, unspecified: Secondary | ICD-10-CM | POA: Diagnosis not present

## 2020-07-24 DIAGNOSIS — I2510 Atherosclerotic heart disease of native coronary artery without angina pectoris: Secondary | ICD-10-CM | POA: Diagnosis not present

## 2020-07-24 DIAGNOSIS — G8250 Quadriplegia, unspecified: Secondary | ICD-10-CM | POA: Diagnosis not present

## 2020-07-24 DIAGNOSIS — I10 Essential (primary) hypertension: Secondary | ICD-10-CM | POA: Diagnosis not present

## 2020-07-24 DIAGNOSIS — E119 Type 2 diabetes mellitus without complications: Secondary | ICD-10-CM | POA: Diagnosis not present

## 2020-07-24 DIAGNOSIS — E782 Mixed hyperlipidemia: Secondary | ICD-10-CM | POA: Diagnosis not present

## 2020-07-24 DIAGNOSIS — G4733 Obstructive sleep apnea (adult) (pediatric): Secondary | ICD-10-CM | POA: Diagnosis not present

## 2020-07-24 LAB — CBC
HCT: 25.1 % — ABNORMAL LOW (ref 38.5–50.0)
Hemoglobin: 7.7 g/dL — ABNORMAL LOW (ref 13.2–17.1)
MCH: 25.3 pg — ABNORMAL LOW (ref 27.0–33.0)
MCHC: 30.7 g/dL — ABNORMAL LOW (ref 32.0–36.0)
MCV: 82.6 fL (ref 80.0–100.0)
MPV: 9.6 fL (ref 7.5–12.5)
Platelets: 456 10*3/uL — ABNORMAL HIGH (ref 140–400)
RBC: 3.04 10*6/uL — ABNORMAL LOW (ref 4.20–5.80)
RDW: 16.8 % — ABNORMAL HIGH (ref 11.0–15.0)
WBC: 9.5 10*3/uL (ref 3.8–10.8)

## 2020-07-24 LAB — BASIC METABOLIC PANEL
BUN/Creatinine Ratio: 58 (calc) — ABNORMAL HIGH (ref 6–22)
BUN: 15 mg/dL (ref 7–25)
CO2: 25 mmol/L (ref 20–32)
Calcium: 8.6 mg/dL (ref 8.6–10.3)
Chloride: 100 mmol/L (ref 98–110)
Creat: 0.26 mg/dL — ABNORMAL LOW (ref 0.70–1.18)
Glucose, Bld: 322 mg/dL — ABNORMAL HIGH (ref 65–99)
Potassium: 4.7 mmol/L (ref 3.5–5.3)
Sodium: 134 mmol/L — ABNORMAL LOW (ref 135–146)

## 2020-07-24 LAB — SEDIMENTATION RATE: Sed Rate: 89 mm/h — ABNORMAL HIGH (ref 0–20)

## 2020-07-24 LAB — C-REACTIVE PROTEIN: CRP: 120.7 mg/L — ABNORMAL HIGH (ref <8.0)

## 2020-07-28 DIAGNOSIS — K592 Neurogenic bowel, not elsewhere classified: Secondary | ICD-10-CM | POA: Diagnosis not present

## 2020-07-28 DIAGNOSIS — L89510 Pressure ulcer of right ankle, unstageable: Secondary | ICD-10-CM | POA: Diagnosis not present

## 2020-07-28 DIAGNOSIS — E1142 Type 2 diabetes mellitus with diabetic polyneuropathy: Secondary | ICD-10-CM | POA: Diagnosis not present

## 2020-07-28 DIAGNOSIS — L89310 Pressure ulcer of right buttock, unstageable: Secondary | ICD-10-CM | POA: Diagnosis not present

## 2020-07-28 DIAGNOSIS — E782 Mixed hyperlipidemia: Secondary | ICD-10-CM | POA: Diagnosis not present

## 2020-07-28 DIAGNOSIS — G8220 Paraplegia, unspecified: Secondary | ICD-10-CM | POA: Diagnosis not present

## 2020-07-28 DIAGNOSIS — I2510 Atherosclerotic heart disease of native coronary artery without angina pectoris: Secondary | ICD-10-CM | POA: Diagnosis not present

## 2020-07-28 DIAGNOSIS — G4733 Obstructive sleep apnea (adult) (pediatric): Secondary | ICD-10-CM | POA: Diagnosis not present

## 2020-07-28 DIAGNOSIS — I10 Essential (primary) hypertension: Secondary | ICD-10-CM | POA: Diagnosis not present

## 2020-07-28 DIAGNOSIS — G8250 Quadriplegia, unspecified: Secondary | ICD-10-CM | POA: Diagnosis not present

## 2020-07-28 DIAGNOSIS — N319 Neuromuscular dysfunction of bladder, unspecified: Secondary | ICD-10-CM | POA: Diagnosis not present

## 2020-07-28 DIAGNOSIS — Z7409 Other reduced mobility: Secondary | ICD-10-CM | POA: Diagnosis not present

## 2020-07-28 DIAGNOSIS — G373 Acute transverse myelitis in demyelinating disease of central nervous system: Secondary | ICD-10-CM | POA: Diagnosis not present

## 2020-07-28 DIAGNOSIS — E119 Type 2 diabetes mellitus without complications: Secondary | ICD-10-CM | POA: Diagnosis not present

## 2020-07-28 DIAGNOSIS — L89610 Pressure ulcer of right heel, unstageable: Secondary | ICD-10-CM | POA: Diagnosis not present

## 2020-07-29 ENCOUNTER — Other Ambulatory Visit: Payer: Self-pay | Admitting: *Deleted

## 2020-07-29 ENCOUNTER — Telehealth: Payer: Self-pay | Admitting: *Deleted

## 2020-07-29 NOTE — Telephone Encounter (Signed)
I recommend starting him on oral amoxicillin clavulanate.  If he does not improve I would suggest repeating a pelvic MRI to see if he needs drainage or debridement.

## 2020-07-29 NOTE — Telephone Encounter (Signed)
Received update from [facility] wound care nurse, Henriette Combs.  Ecologist, PA at [facility] saw patient yesterday to attend to his sacral ulcer. Per nurse, PA debrided necrotic tissue and pulled out about 60 cc of purulent, foul smelling fluid.  Patient not currently on antibiotics. PA is willing to write antibiotic prescription, but would like Dr Hale Bogus input.  Loran Macgreger, PA - [phone]). Please advise. Landis Gandy, RN

## 2020-07-29 NOTE — Telephone Encounter (Signed)
Relayed to [employer]. She will start antibiotics per Dr Ephriam Knuckles suggestion, is following up with patient 11/17.  If worse between now and then, the nurse will call Loran.  If not better when she sees him 11/17, Levi Aland will pursue imaging as Dr Megan Salon suggests. Landis Gandy, RN

## 2020-07-31 DIAGNOSIS — E118 Type 2 diabetes mellitus with unspecified complications: Secondary | ICD-10-CM | POA: Diagnosis not present

## 2020-07-31 DIAGNOSIS — D75838 Other thrombocytosis: Secondary | ICD-10-CM | POA: Diagnosis not present

## 2020-07-31 DIAGNOSIS — D72828 Other elevated white blood cell count: Secondary | ICD-10-CM | POA: Diagnosis not present

## 2020-07-31 DIAGNOSIS — G8250 Quadriplegia, unspecified: Secondary | ICD-10-CM | POA: Diagnosis not present

## 2020-07-31 DIAGNOSIS — D508 Other iron deficiency anemias: Secondary | ICD-10-CM | POA: Diagnosis not present

## 2020-07-31 DIAGNOSIS — L89300 Pressure ulcer of unspecified buttock, unstageable: Secondary | ICD-10-CM | POA: Diagnosis not present

## 2020-07-31 DIAGNOSIS — Z794 Long term (current) use of insulin: Secondary | ICD-10-CM | POA: Diagnosis not present

## 2020-07-31 DIAGNOSIS — Z933 Colostomy status: Secondary | ICD-10-CM | POA: Diagnosis not present

## 2020-07-31 DIAGNOSIS — I2510 Atherosclerotic heart disease of native coronary artery without angina pectoris: Secondary | ICD-10-CM | POA: Diagnosis not present

## 2020-07-31 DIAGNOSIS — G373 Acute transverse myelitis in demyelinating disease of central nervous system: Secondary | ICD-10-CM | POA: Diagnosis not present

## 2020-07-31 DIAGNOSIS — L89154 Pressure ulcer of sacral region, stage 4: Secondary | ICD-10-CM | POA: Diagnosis not present

## 2020-07-31 DIAGNOSIS — E440 Moderate protein-calorie malnutrition: Secondary | ICD-10-CM | POA: Diagnosis not present

## 2020-08-01 DIAGNOSIS — K5900 Constipation, unspecified: Secondary | ICD-10-CM | POA: Diagnosis not present

## 2020-08-03 DIAGNOSIS — G373 Acute transverse myelitis in demyelinating disease of central nervous system: Secondary | ICD-10-CM | POA: Diagnosis not present

## 2020-08-03 DIAGNOSIS — N319 Neuromuscular dysfunction of bladder, unspecified: Secondary | ICD-10-CM | POA: Diagnosis not present

## 2020-08-03 DIAGNOSIS — K592 Neurogenic bowel, not elsewhere classified: Secondary | ICD-10-CM | POA: Diagnosis not present

## 2020-08-03 DIAGNOSIS — I2510 Atherosclerotic heart disease of native coronary artery without angina pectoris: Secondary | ICD-10-CM | POA: Diagnosis not present

## 2020-08-03 DIAGNOSIS — E782 Mixed hyperlipidemia: Secondary | ICD-10-CM | POA: Diagnosis not present

## 2020-08-03 DIAGNOSIS — G8250 Quadriplegia, unspecified: Secondary | ICD-10-CM | POA: Diagnosis not present

## 2020-08-03 DIAGNOSIS — Z7409 Other reduced mobility: Secondary | ICD-10-CM | POA: Diagnosis not present

## 2020-08-03 DIAGNOSIS — I10 Essential (primary) hypertension: Secondary | ICD-10-CM | POA: Diagnosis not present

## 2020-08-03 DIAGNOSIS — E119 Type 2 diabetes mellitus without complications: Secondary | ICD-10-CM | POA: Diagnosis not present

## 2020-08-03 DIAGNOSIS — E1142 Type 2 diabetes mellitus with diabetic polyneuropathy: Secondary | ICD-10-CM | POA: Diagnosis not present

## 2020-08-03 DIAGNOSIS — G4733 Obstructive sleep apnea (adult) (pediatric): Secondary | ICD-10-CM | POA: Diagnosis not present

## 2020-08-04 DIAGNOSIS — L89154 Pressure ulcer of sacral region, stage 4: Secondary | ICD-10-CM | POA: Diagnosis not present

## 2020-08-04 DIAGNOSIS — G373 Acute transverse myelitis in demyelinating disease of central nervous system: Secondary | ICD-10-CM | POA: Diagnosis not present

## 2020-08-04 DIAGNOSIS — L89314 Pressure ulcer of right buttock, stage 4: Secondary | ICD-10-CM | POA: Diagnosis not present

## 2020-08-04 DIAGNOSIS — L89310 Pressure ulcer of right buttock, unstageable: Secondary | ICD-10-CM | POA: Diagnosis not present

## 2020-08-04 DIAGNOSIS — E11622 Type 2 diabetes mellitus with other skin ulcer: Secondary | ICD-10-CM | POA: Diagnosis not present

## 2020-08-04 DIAGNOSIS — L89610 Pressure ulcer of right heel, unstageable: Secondary | ICD-10-CM | POA: Diagnosis not present

## 2020-08-04 DIAGNOSIS — G8220 Paraplegia, unspecified: Secondary | ICD-10-CM | POA: Diagnosis not present

## 2020-08-04 DIAGNOSIS — L89513 Pressure ulcer of right ankle, stage 3: Secondary | ICD-10-CM | POA: Diagnosis not present

## 2020-08-05 DIAGNOSIS — E1142 Type 2 diabetes mellitus with diabetic polyneuropathy: Secondary | ICD-10-CM | POA: Diagnosis not present

## 2020-08-05 DIAGNOSIS — K592 Neurogenic bowel, not elsewhere classified: Secondary | ICD-10-CM | POA: Diagnosis not present

## 2020-08-05 DIAGNOSIS — N319 Neuromuscular dysfunction of bladder, unspecified: Secondary | ICD-10-CM | POA: Diagnosis not present

## 2020-08-05 DIAGNOSIS — E119 Type 2 diabetes mellitus without complications: Secondary | ICD-10-CM | POA: Diagnosis not present

## 2020-08-05 DIAGNOSIS — G8250 Quadriplegia, unspecified: Secondary | ICD-10-CM | POA: Diagnosis not present

## 2020-08-05 DIAGNOSIS — G373 Acute transverse myelitis in demyelinating disease of central nervous system: Secondary | ICD-10-CM | POA: Diagnosis not present

## 2020-08-05 DIAGNOSIS — E440 Moderate protein-calorie malnutrition: Secondary | ICD-10-CM | POA: Diagnosis not present

## 2020-08-05 DIAGNOSIS — R7309 Other abnormal glucose: Secondary | ICD-10-CM | POA: Diagnosis not present

## 2020-08-05 DIAGNOSIS — G4733 Obstructive sleep apnea (adult) (pediatric): Secondary | ICD-10-CM | POA: Diagnosis not present

## 2020-08-05 DIAGNOSIS — I10 Essential (primary) hypertension: Secondary | ICD-10-CM | POA: Diagnosis not present

## 2020-08-05 DIAGNOSIS — E782 Mixed hyperlipidemia: Secondary | ICD-10-CM | POA: Diagnosis not present

## 2020-08-05 DIAGNOSIS — I2510 Atherosclerotic heart disease of native coronary artery without angina pectoris: Secondary | ICD-10-CM | POA: Diagnosis not present

## 2020-08-05 DIAGNOSIS — Z7409 Other reduced mobility: Secondary | ICD-10-CM | POA: Diagnosis not present

## 2020-08-06 ENCOUNTER — Other Ambulatory Visit: Payer: Self-pay | Admitting: *Deleted

## 2020-08-06 NOTE — Patient Outreach (Signed)
[facility] Post-Acute Care Coordinator follow up. Member screened for potential [facility] Care Management needs as a benefit of [employer].  Verified in Patient Ping that member remains in [facility].   Communication sent to [facility] SWs to request update on transition plans.   Will continue to follow transition plans and potential [facility] Care Management needs while member resides in SNF.   Hermilo Dutter, MSN, RN,BSN [facility] Post Acute Care Coordinator [phone] ( Business Mobile) [phone]  (Toll free office)  

## 2020-08-10 ENCOUNTER — Ambulatory Visit: Payer: Medicare Other | Admitting: Internal Medicine

## 2020-08-10 DIAGNOSIS — G373 Acute transverse myelitis in demyelinating disease of central nervous system: Secondary | ICD-10-CM | POA: Diagnosis not present

## 2020-08-10 DIAGNOSIS — N319 Neuromuscular dysfunction of bladder, unspecified: Secondary | ICD-10-CM | POA: Diagnosis not present

## 2020-08-10 DIAGNOSIS — E782 Mixed hyperlipidemia: Secondary | ICD-10-CM | POA: Diagnosis not present

## 2020-08-10 DIAGNOSIS — E119 Type 2 diabetes mellitus without complications: Secondary | ICD-10-CM | POA: Diagnosis not present

## 2020-08-10 DIAGNOSIS — Z7409 Other reduced mobility: Secondary | ICD-10-CM | POA: Diagnosis not present

## 2020-08-10 DIAGNOSIS — I10 Essential (primary) hypertension: Secondary | ICD-10-CM | POA: Diagnosis not present

## 2020-08-10 DIAGNOSIS — I2510 Atherosclerotic heart disease of native coronary artery without angina pectoris: Secondary | ICD-10-CM | POA: Diagnosis not present

## 2020-08-10 DIAGNOSIS — K592 Neurogenic bowel, not elsewhere classified: Secondary | ICD-10-CM | POA: Diagnosis not present

## 2020-08-10 DIAGNOSIS — E1142 Type 2 diabetes mellitus with diabetic polyneuropathy: Secondary | ICD-10-CM | POA: Diagnosis not present

## 2020-08-10 DIAGNOSIS — G4733 Obstructive sleep apnea (adult) (pediatric): Secondary | ICD-10-CM | POA: Diagnosis not present

## 2020-08-10 DIAGNOSIS — G8250 Quadriplegia, unspecified: Secondary | ICD-10-CM | POA: Diagnosis not present

## 2020-08-11 DIAGNOSIS — L89310 Pressure ulcer of right buttock, unstageable: Secondary | ICD-10-CM | POA: Diagnosis not present

## 2020-08-11 DIAGNOSIS — G8220 Paraplegia, unspecified: Secondary | ICD-10-CM | POA: Diagnosis not present

## 2020-08-11 DIAGNOSIS — L89513 Pressure ulcer of right ankle, stage 3: Secondary | ICD-10-CM | POA: Diagnosis not present

## 2020-08-11 DIAGNOSIS — L89610 Pressure ulcer of right heel, unstageable: Secondary | ICD-10-CM | POA: Diagnosis not present

## 2020-08-12 DIAGNOSIS — M6258 Muscle wasting and atrophy, not elsewhere classified, other site: Secondary | ICD-10-CM | POA: Diagnosis not present

## 2020-08-12 DIAGNOSIS — R1312 Dysphagia, oropharyngeal phase: Secondary | ICD-10-CM | POA: Diagnosis not present

## 2020-08-12 DIAGNOSIS — G373 Acute transverse myelitis in demyelinating disease of central nervous system: Secondary | ICD-10-CM | POA: Diagnosis not present

## 2020-08-12 DIAGNOSIS — G8250 Quadriplegia, unspecified: Secondary | ICD-10-CM | POA: Diagnosis not present

## 2020-08-12 DIAGNOSIS — R41841 Cognitive communication deficit: Secondary | ICD-10-CM | POA: Diagnosis not present

## 2020-08-12 DIAGNOSIS — Z7409 Other reduced mobility: Secondary | ICD-10-CM | POA: Diagnosis not present

## 2020-08-12 DIAGNOSIS — R2681 Unsteadiness on feet: Secondary | ICD-10-CM | POA: Diagnosis not present

## 2020-08-12 DIAGNOSIS — M6281 Muscle weakness (generalized): Secondary | ICD-10-CM | POA: Diagnosis not present

## 2020-08-12 DIAGNOSIS — R278 Other lack of coordination: Secondary | ICD-10-CM | POA: Diagnosis not present

## 2020-08-12 DIAGNOSIS — R532 Functional quadriplegia: Secondary | ICD-10-CM | POA: Diagnosis not present

## 2020-08-17 DIAGNOSIS — R41841 Cognitive communication deficit: Secondary | ICD-10-CM | POA: Diagnosis not present

## 2020-08-17 DIAGNOSIS — M6258 Muscle wasting and atrophy, not elsewhere classified, other site: Secondary | ICD-10-CM | POA: Diagnosis not present

## 2020-08-17 DIAGNOSIS — Z7409 Other reduced mobility: Secondary | ICD-10-CM | POA: Diagnosis not present

## 2020-08-17 DIAGNOSIS — R532 Functional quadriplegia: Secondary | ICD-10-CM | POA: Diagnosis not present

## 2020-08-17 DIAGNOSIS — M6281 Muscle weakness (generalized): Secondary | ICD-10-CM | POA: Diagnosis not present

## 2020-08-17 DIAGNOSIS — R2681 Unsteadiness on feet: Secondary | ICD-10-CM | POA: Diagnosis not present

## 2020-08-18 DIAGNOSIS — R41841 Cognitive communication deficit: Secondary | ICD-10-CM | POA: Diagnosis not present

## 2020-08-18 DIAGNOSIS — Z7409 Other reduced mobility: Secondary | ICD-10-CM | POA: Diagnosis not present

## 2020-08-18 DIAGNOSIS — M6258 Muscle wasting and atrophy, not elsewhere classified, other site: Secondary | ICD-10-CM | POA: Diagnosis not present

## 2020-08-18 DIAGNOSIS — R532 Functional quadriplegia: Secondary | ICD-10-CM | POA: Diagnosis not present

## 2020-08-18 DIAGNOSIS — R2681 Unsteadiness on feet: Secondary | ICD-10-CM | POA: Diagnosis not present

## 2020-08-18 DIAGNOSIS — M6281 Muscle weakness (generalized): Secondary | ICD-10-CM | POA: Diagnosis not present

## 2020-08-19 DIAGNOSIS — M6258 Muscle wasting and atrophy, not elsewhere classified, other site: Secondary | ICD-10-CM | POA: Diagnosis not present

## 2020-08-19 DIAGNOSIS — R532 Functional quadriplegia: Secondary | ICD-10-CM | POA: Diagnosis not present

## 2020-08-19 DIAGNOSIS — M6281 Muscle weakness (generalized): Secondary | ICD-10-CM | POA: Diagnosis not present

## 2020-08-19 DIAGNOSIS — R278 Other lack of coordination: Secondary | ICD-10-CM | POA: Diagnosis not present

## 2020-08-19 DIAGNOSIS — Z931 Gastrostomy status: Secondary | ICD-10-CM | POA: Diagnosis not present

## 2020-08-19 DIAGNOSIS — G8250 Quadriplegia, unspecified: Secondary | ICD-10-CM | POA: Diagnosis not present

## 2020-08-19 DIAGNOSIS — G373 Acute transverse myelitis in demyelinating disease of central nervous system: Secondary | ICD-10-CM | POA: Diagnosis not present

## 2020-08-19 DIAGNOSIS — R110 Nausea: Secondary | ICD-10-CM | POA: Diagnosis not present

## 2020-08-19 DIAGNOSIS — R2681 Unsteadiness on feet: Secondary | ICD-10-CM | POA: Diagnosis not present

## 2020-08-19 DIAGNOSIS — R1310 Dysphagia, unspecified: Secondary | ICD-10-CM | POA: Diagnosis not present

## 2020-08-19 DIAGNOSIS — R41841 Cognitive communication deficit: Secondary | ICD-10-CM | POA: Diagnosis not present

## 2020-08-19 DIAGNOSIS — Z7409 Other reduced mobility: Secondary | ICD-10-CM | POA: Diagnosis not present

## 2020-08-19 DIAGNOSIS — Z933 Colostomy status: Secondary | ICD-10-CM | POA: Diagnosis not present

## 2020-08-19 DIAGNOSIS — R1312 Dysphagia, oropharyngeal phase: Secondary | ICD-10-CM | POA: Diagnosis not present

## 2020-08-20 ENCOUNTER — Ambulatory Visit (INDEPENDENT_AMBULATORY_CARE_PROVIDER_SITE_OTHER): Payer: Medicare Other | Admitting: Neurology

## 2020-08-20 ENCOUNTER — Encounter: Payer: Self-pay | Admitting: Neurology

## 2020-08-20 VITALS — BP 123/61 | HR 83

## 2020-08-20 DIAGNOSIS — L0231 Cutaneous abscess of buttock: Secondary | ICD-10-CM | POA: Diagnosis not present

## 2020-08-20 DIAGNOSIS — R52 Pain, unspecified: Secondary | ICD-10-CM | POA: Diagnosis not present

## 2020-08-20 DIAGNOSIS — Z79899 Other long term (current) drug therapy: Secondary | ICD-10-CM

## 2020-08-20 DIAGNOSIS — N319 Neuromuscular dysfunction of bladder, unspecified: Secondary | ICD-10-CM | POA: Diagnosis not present

## 2020-08-20 DIAGNOSIS — M6258 Muscle wasting and atrophy, not elsewhere classified, other site: Secondary | ICD-10-CM | POA: Diagnosis not present

## 2020-08-20 DIAGNOSIS — Z5181 Encounter for therapeutic drug level monitoring: Secondary | ICD-10-CM | POA: Insufficient documentation

## 2020-08-20 DIAGNOSIS — K592 Neurogenic bowel, not elsewhere classified: Secondary | ICD-10-CM

## 2020-08-20 DIAGNOSIS — G8250 Quadriplegia, unspecified: Secondary | ICD-10-CM

## 2020-08-20 DIAGNOSIS — I959 Hypotension, unspecified: Secondary | ICD-10-CM | POA: Diagnosis not present

## 2020-08-20 DIAGNOSIS — R532 Functional quadriplegia: Secondary | ICD-10-CM | POA: Diagnosis not present

## 2020-08-20 DIAGNOSIS — G360 Neuromyelitis optica [Devic]: Secondary | ICD-10-CM | POA: Diagnosis not present

## 2020-08-20 DIAGNOSIS — M2550 Pain in unspecified joint: Secondary | ICD-10-CM | POA: Diagnosis not present

## 2020-08-20 DIAGNOSIS — M6281 Muscle weakness (generalized): Secondary | ICD-10-CM | POA: Diagnosis not present

## 2020-08-20 DIAGNOSIS — R897 Abnormal histological findings in specimens from other organs, systems and tissues: Secondary | ICD-10-CM | POA: Diagnosis not present

## 2020-08-20 DIAGNOSIS — I2510 Atherosclerotic heart disease of native coronary artery without angina pectoris: Secondary | ICD-10-CM

## 2020-08-20 DIAGNOSIS — Z7409 Other reduced mobility: Secondary | ICD-10-CM | POA: Diagnosis not present

## 2020-08-20 DIAGNOSIS — R5381 Other malaise: Secondary | ICD-10-CM | POA: Diagnosis not present

## 2020-08-20 DIAGNOSIS — R41841 Cognitive communication deficit: Secondary | ICD-10-CM | POA: Diagnosis not present

## 2020-08-20 DIAGNOSIS — R799 Abnormal finding of blood chemistry, unspecified: Secondary | ICD-10-CM | POA: Diagnosis not present

## 2020-08-20 DIAGNOSIS — Z7401 Bed confinement status: Secondary | ICD-10-CM | POA: Diagnosis not present

## 2020-08-20 DIAGNOSIS — R2681 Unsteadiness on feet: Secondary | ICD-10-CM | POA: Diagnosis not present

## 2020-08-20 NOTE — Progress Notes (Signed)
[facility]  PATIENT: Brent [name]. DOB: 09-05-1946  REFERRING DOCTOR OR PCP: Lelon Frohlich, MD SOURCE: Patient, notes from recent hospitalization, multiple MRI reports, laboratory reports, multiple MRI images personally reviewed.  _________________________________   HISTORICAL  CHIEF COMPLAINT:  Chief Complaint  Patient presents with  . Follow-up    RM 16. Brought by EMS on stretcher. Last seen 05/06/20. Has wound vac on buttock on right side. Wound care managing this. Still at [facility].    HISTORY OF PRESENT ILLNESS:   Brent [name] is a 74 y.o. 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  He feels his neurologic status has been stable.  He has mild to moderate weakness in the hands and the left arm and no strength in the legs.  He does not note much difficulty with numbness.  He has a neurogenic bladder.  He has no visual problems.  He reports that he just had his first UTI.  Of note, the Foley catheter may have been blocked at the time as 600 cc came out when a new Foley was placed.  He has has an ileostomy.  He feels vision is stable  For the neuromyelitis optica, he was treated with Rituxan in July and is shortly due for his next course of treatment.  We had a discussion about the infection risk with Rituxan.  Unfortunately, there can be issues with all of the medications used to treat neuromyelitis optica.  The infusion schedule is better for him with Rituxan since it is a double infusion every 6 months compared to a more frequent fusion.  He had a sacral wound requiring antibiotics and frequent visits to wound care.  He feels he is doing much better.  I reviewed some notes from wound management and it does appear like he is doing better with main lesion.  He has had some other smaller stones but they did not develop into significant ulcers.    He sees Lenell Antu, Wound  management.    He had his Covid-19 vaccination Levan Hurst) in January an February.  He has diabetes mellitus and had hyperglycemia with the steroids in the hospital.  He may have had a mild polyneuropathy from the diabetes.   NMOSD HIstory: On March 08, 2020, while sitting at church, he started to have intense pain in his chest.  He was concerned about an MI and drove to his nearby house.   He continued to feel poorly and his wife called an ambulance.   He went to [facility] ED.and EKG was fine.  While in the ED, he started to experience numbness first in his right leg then left leg and then in the hands.     MRIs of the spine were initially felt to be non-contributary.   Initially a spinal stroke was suspected.   For one day, he had SOB and went to the ICU.   Anti-NMO Abs returned positive.   He received 5 day course of IV Solumedrol and 5 days IVIg.    Rituxan was infused 03/24/2020 and again 2 weeks later. He went to the Rehab floor and started to have some improvement.   IMAGE REVIEW MRI 03/16/2020 cervical and thoracic spine: There is a T2 hyperintense focus from C5-T4.  There is minimal patchy enhancement.  It has enlarged compared to the 03/10/2020 MRI.  MRI 03/10/2020 cervical and thoracic spine.  There is a patchy T2 hyperintense focus from  T1-T4.  It did not enhance.  MRI brain 03/11/2020 shows mild generalized cortical atrophy and a Mega cisterna magna.  No acute findings.  LABS 8/21 - 9/21 NMO antibody was positive at 6.5 (less than 3 normal).      ANA, B12, SSA/B, copper, SARS-CoV-2 PCR, antiphospholipid, paraneoplastic panel were negative.   CSF showed mildly elevated protein and was otherwise normal. Hep B sAg, sAb and cAb negative.   Quantiferon TB negative.    REVIEW OF SYSTEMS: Constitutional: No fevers, chills, sweats, or change in appetite Eyes: No visual changes, double vision, eye pain Ear, nose and throat: No hearing loss, ear pain, nasal congestion, sore throat Cardiovascular: No  chest pain, palpitations Respiratory: No shortness of breath at rest or with exertion.   No wheezes GastrointestinaI: No nausea, vomiting, diarrhea, abdominal pain, fecal incontinence Genitourinary:Has Foley catheter for neurogenic bladder Musculoskeletal: No neck pain, back pain Integumentary: No rash, pruritus, skin lesions Neurological: as above Psychiatric: No depression at this time.  No anxiety Endocrine: No palpitations, diaphoresis, change in appetite, change in weigh or increased thirst Hematologic/Lymphatic: No anemia, purpura, petechiae. Allergic/Immunologic: No itchy/runny eyes, nasal congestion, recent allergic reactions, rashes  ALLERGIES: Allergies  Allergen Reactions  . Levofloxacin Rash  . Penicillins Hives and Rash    Tolerated Unasyn, cefepime and cephalexin    HOME MEDICATIONS:  Current Outpatient Medications:  .  acetaminophen (TYLENOL) 325 MG tablet, Take 2 tablets (650 mg total) by mouth every 6 (six) hours as needed for headache., Disp: , Rfl:  .  acetaminophen (TYLENOL) 500 MG tablet, Take 500 mg by mouth 2 (two) times daily as needed., Disp: , Rfl:  .  albuterol (PROVENTIL) (2.5 MG/3ML) 0.083% nebulizer solution, Take 3 mLs (2.5 mg total) by nebulization once as needed for wheezing or shortness of breath (or anaphylaxis due to Rituxan infusion)., Disp: 75 mL, Rfl: 12 .  Amino Acids-Protein Hydrolys (FEEDING SUPPLEMENT, PRO-STAT SUGAR FREE 64,) LIQD, Take 30 mLs by mouth in the morning and at bedtime., Disp: , Rfl:  .  antiseptic oral rinse (BIOTENE) LIQD, 10 mLs by Mouth Rinse route 2 (two) times daily. Rinse and spit, Disp: , Rfl:  .  Ascorbic Acid (VITA-C PO), Take 500 mg by mouth daily., Disp: , Rfl:  .  aspirin EC 81 MG tablet, Take 81 mg by mouth at bedtime. Swallow whole., Disp: , Rfl:  .  atorvastatin (LIPITOR) 20 MG tablet, Take 1 tablet (20 mg total) by mouth daily., Disp: , Rfl:  .  cholestyramine (QUESTRAN) 4 g packet, Take 4 g by mouth daily.,  Disp: , Rfl:  .  collagenase (SANTYL) ointment, Cleanse sacral area and then apply a layer of santyl with damp to dry dressing. Change daily and prn if soiled (Patient taking differently: Apply 1 application topically. Cleanse sacral area and then apply a layer of santyl with damp to dry dressing. Change daily and prn if soiled), Disp: 15 g, Rfl: 0 .  diclofenac Sodium (VOLTAREN) 1 % GEL, Apply 2 g topically 2 (two) times daily. Apply to Right shoulder, Disp: , Rfl:  .  ferrous sulfate 300 (60 Fe) MG/5ML syrup, Take 300 mg by mouth daily., Disp: , Rfl:  .  fluticasone (FLONASE) 50 MCG/ACT nasal spray, Place 2 sprays into both nostrils 2 (two) times daily., Disp: , Rfl: 2 .  gabapentin (NEURONTIN) 300 MG capsule, Place 1 capsule (300 mg total) into feeding tube at bedtime. (Patient taking differently: Take 300 mg by mouth 3 (three) times daily. ),  Disp: , Rfl:  .  Icosapent Ethyl (VASCEPA) 1 g CAPS, Take 2 capsules (2 g total) by mouth 2 (two) times daily., Disp: 120 capsule, Rfl: 3 .  insulin aspart (NOVOLOG) 100 UNIT/ML injection, Inject 0-15 Units into the skin every 4 (four) hours. Sliding scale  CBG 70 - 120: 0 units: CBG 121 - 150: 2 units; CBG 151 - 200: 3 units; CBG 201 - 250: 5 units; CBG 251 - 300: 8 units;CBG 301 - 350: 11 units; CBG 351 - 400: 15 units; CBG > 400 : 15 units and notify MD, Disp: 10 mL, Rfl: 11 .  insulin glargine (LANTUS) 100 unit/mL SOPN, Inject 15 Units into the skin at bedtime. (Patient taking differently: Inject 38 Units into the skin at bedtime. ), Disp: 15 mL, Rfl: 11 .  lactulose (CHRONULAC) 10 GM/15ML solution, Take 15 g by mouth as needed for mild constipation., Disp: , Rfl:  .  Lidocaine (HM LIDOCAINE PATCH) 4 % PTCH, Apply 1 patch topically in the morning and at bedtime., Disp: , Rfl:  .  Menthol, Topical Analgesic, (BIOFREEZE) 4 % GEL, Apply 1 application topically in the morning, at noon, and at bedtime. , Disp: , Rfl:  .  metFORMIN (GLUCOPHAGE) 500 MG tablet, Take  1,000 mg by mouth daily with breakfast. 1000mg  in the am, 500mg  qhs, Disp: , Rfl:  .  Multiple Vitamin (MULTIVITAMIN WITH MINERALS) TABS tablet, Place 1 tablet into feeding tube daily. Centrum Silver Men's, Disp: , Rfl:  .  ondansetron (ZOFRAN-ODT) 4 MG disintegrating tablet, Take 1 tablet (4 mg total) by mouth 2 (two) times daily. X 21 days, Disp: 20 tablet, Rfl: 0 .  pantoprazole sodium (PROTONIX) 40 mg/20 mL PACK, Place 20 mLs (40 mg total) into feeding tube daily. (Patient taking differently: Take 40 mg by mouth 2 (two) times daily. ), Disp: 30 mL, Rfl:  .  polyvinyl alcohol (LIQUIFILM TEARS) 1.4 % ophthalmic solution, Place 2 drops into both eyes as needed for dry eyes., Disp: , Rfl:  .  saccharomyces boulardii (FLORASTOR) 250 MG capsule, Place 1 capsule (250 mg total) into feeding tube 2 (two) times daily., Disp: , Rfl:  .  senna-docusate (SENOKOT-S) 8.6-50 MG tablet, Take 1 tablet by mouth daily., Disp: , Rfl:  .  sertraline (ZOLOFT) 100 MG tablet, Take 100 mg by mouth at bedtime. , Disp: , Rfl:  .  tamsulosin (FLOMAX) 0.4 MG CAPS capsule, Take 1 capsule (0.4 mg total) by mouth daily after supper., Disp: 30 capsule, Rfl:  .  traMADol (ULTRAM) 50 MG tablet, Take 1 tablet (50 mg total) by mouth every 12 (twelve) hours as needed for moderate pain., Disp: 10 tablet, Rfl: 0 .  traZODone (DESYREL) 50 MG tablet, Take 50 mg by mouth at bedtime., Disp: , Rfl:  .  vitamin B-12 (CYANOCOBALAMIN) 500 MCG tablet, Take 500 mcg by mouth daily., Disp: , Rfl:  .  zinc sulfate 220 (50 Zn) MG capsule, Place 1 capsule (220 mg total) into feeding tube daily. (Patient taking differently: Take 220 mg by mouth daily. ), Disp: , Rfl:   PAST MEDICAL HISTORY: Past Medical History:  Diagnosis Date  . Allergy   . CAD (coronary artery disease)    a. angioplasty of his RCA in 1990. b. bare metal stent placed in the RCA in 2000 followed by rotational atherectomy shortly after for stent restenosis. c. last cath was in 2012  showing stable moderate diffuse CAD. (70% mid LAD, 80% diagonal, 70% Ramus, 40% mid  to distal RCA stent restenosis). d. Low risk nuc in 2015.  . Diabetes mellitus   . Diverticulosis   . Elevated CK   . Erectile dysfunction   . Hemorrhoids   . HTN (hypertension)   . Hyperlipidemia   . Hypertriglyceridemia   . Malignant melanoma of left side of neck ([place]) 10/25/2018  . Myocardial infarction ([place])   . Obesity   . OSA (obstructive sleep apnea)   . Persistent disorder of initiating or maintaining sleep   . Personal history of colonic polyps 02/05/2003    PAST SURGICAL HISTORY: Past Surgical History:  Procedure Laterality Date  . CHOLECYSTECTOMY    . CORONARY ANGIOPLASTY    . CORONARY STENT PLACEMENT     stenting of the right coronary artery with followup rotational  atherectomy. (3 stents placed)  . FINGER SURGERY     right  . FOOT SURGERY     right  . INGUINAL HERNIA REPAIR     right  . IRRIGATION AND DEBRIDEMENT ABSCESS N/A 06/04/2020   Procedure: IRRIGATION AND DEBRIDEMENT SACRAL WOUND;  Surgeon: Jesusita Oka, MD;  Location: [place];  Service: General;  Laterality: N/A;  . LAPAROSCOPY N/A 06/04/2020   Procedure: LAPAROSCOPY ASSISTED COLOSTOMY, LAPAROSCOPY GASTROSTOMY;  Surgeon: Jesusita Oka, MD;  Location: [place];  Service: General;  Laterality: N/A;  . LYSIS OF ADHESION N/A 06/04/2020   Procedure: LYSIS OF ADHESION;  Surgeon: Jesusita Oka, MD;  Location: [place];  Service: General;  Laterality: N/A;  . melanoma removal     neck  . ORTHOPEDIC SURGERY     foot right  . rotator cuff surg     Bil    FAMILY HISTORY: Family History  Problem Relation Age of Onset  . Liver cancer Father   . Lung cancer Father   . Heart disease Father   . Heart disease Sister   . Lung cancer Mother   . COPD Mother   . Obesity Mother   . Colon cancer Neg Hx   . Stomach cancer Neg Hx   . Pancreatic cancer Neg Hx     SOCIAL HISTORY:  Social History   Socioeconomic History  .  Marital status: Married    Spouse name: Not on file  . Number of children: 1  . Years of education: Not on file  . Highest education level: Not on file  Occupational History  . Occupation: Teacher, English as a foreign language    Comment: [employer]  . Smoking status: Former Smoker    Packs/day: 1.50    Years: 30.00    Pack years: 45.00    Types: Cigarettes    Quit date: 09/19/1980    Years since quitting: 39.9  . Smokeless tobacco: Never Used  Vaping Use  . Vaping Use: Never used  Substance and Sexual Activity  . Alcohol use: Not Currently    Alcohol/week: 7.0 - 14.0 standard drinks    Types: 7 - 14 Shots of liquor per week  . Drug use: No  . Sexual activity: Not Currently    Partners: Female  Other Topics Concern  . Not on file  Social History Narrative   12/14/2018: Lives with wife and 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 (son had passed away several years ago)   Recevies much of his care through [place]   Enjoys golfing   Social Determinants of Health   Financial Resource Strain: Low Risk   . Difficulty of Paying Living Expenses: Not hard at all  Food Insecurity:   .  Worried About Charity fundraiser in the Last Year: Not on file  . Ran Out of Food in the Last Year: Not on file  Transportation Needs: No Transportation Needs  . Lack of Transportation (Medical): No  . Lack of Transportation (Non-Medical): No  Physical Activity:   . Days of Exercise per Week: Not on file  . Minutes of Exercise per Session: Not on file  Stress:   . Feeling of Stress : Not on file  Social Connections:   . Frequency of Communication with Friends and Family: Not on file  . Frequency of Social Gatherings with Friends and Family: Not on file  . Attends Religious Services: Not on file  . Active Member of Clubs or Organizations: Not on file  . Attends Archivist Meetings: Not on file  . Marital Status: Not on file  Intimate Partner Violence:   . Fear of Current or Ex-Partner: Not on file  . Emotionally  Abused: Not on file  . Physically Abused: Not on file  . Sexually Abused: Not on file     PHYSICAL EXAM  Vitals:   08/20/20 0940  BP: 123/61  Pulse: 83  SpO2: 98%    There is no height or weight on file to calculate BMI.   General: The patient is well-developed and well-nourished and in no acute distress  HEENT:  Head is [place]/AT.  Sclera are anicteric.    Skin: Extremities show edema at the ankles.  No rashes.  Neurologic Exam  Mental status: The patient is alert and oriented x 3 at the time of the examination. The patient has apparent normal recent and remote memory, with an apparently normal attention span and concentration ability.   Speech is normal.  Cranial nerves: Extraocular movements are full. Pupils are equal, round, and reactive to light and accomodation.  Visual fields are full.  Facial symmetry is present. There is good facial sensation to soft touch bilaterally.Facial strength is normal.  Trapezius and sternocleidomastoid strength is normal. No dysarthria is noted.  The tongue is midline, and the patient has symmetric elevation of the soft palate. No obvious hearing deficits are noted.  Motor:  Muscle bulk is normal except for intrinsic hand muscles that show some atrophy.  In the right arm, strength was 5/5 in the deltoid and biceps, 4/5 in the triceps, 3/5 in finger flexors and extensors and 0-1 in the intrinsic hand muscles.  In the left arm, strength was 4/5 in the biceps, 2/5 in the triceps, 2/5 in finger flexors and 0-1 in finger extensors and intrinsic hand muscles.  Strength was 0/5 in both legs.  Sensory: In the legs, he has mildly reduced sensation to temperature but normal sensation to touch.  He has fairly normal sensation to vibration.  Coordination: Cerebellar testing reveals mildly reduced finger-nose-finger.  He could not do heel-to-[name].  Gait and station: He is unable to stand  Reflexes: Deep tendon reflexes are 3+ at the knees and 2+ at the ankles.   The plantar responses were extensor.    DIAGNOSTIC DATA (LABS, IMAGING, TESTING) - I reviewed patient records, labs, notes, testing and imaging myself where available.  Lab Results  Component Value Date   WBC 9.5 07/23/2020   HGB 7.7 (L) 07/23/2020   HCT 25.1 (L) 07/23/2020   MCV 82.6 07/23/2020   PLT 456 (H) 07/23/2020      Component Value Date/Time   NA 134 (L) 07/23/2020 1052   NA 140 04/11/2019 1028  K 4.7 07/23/2020 1052   CL 100 07/23/2020 1052   CO2 25 07/23/2020 1052   GLUCOSE 322 (H) 07/23/2020 1052   BUN 15 07/23/2020 1052   BUN 21 04/11/2019 1028   CREATININE 0.26 (L) 07/23/2020 1052   CALCIUM 8.6 07/23/2020 1052   PROT 5.3 (L) 06/01/2020 1248   PROT 6.6 01/20/2020 0754   ALBUMIN 1.5 (L) 06/01/2020 1248   ALBUMIN 4.2 03/10/2020 1757   AST 62 (H) 06/01/2020 1248   ALT 68 (H) 06/01/2020 1248   ALKPHOS 134 (H) 06/01/2020 1248   BILITOT 0.7 06/01/2020 1248   BILITOT 0.3 01/20/2020 0754   GFRNONAA >60 06/10/2020 0431   GFRNONAA 87 04/15/2016 1133   GFRAA >60 06/10/2020 0431   GFRAA >89 04/15/2016 1133   Lab Results  Component Value Date   CHOL 135 01/20/2020   HDL 23 (L) 01/20/2020   LDLCALC 65 01/20/2020   LDLDIRECT 68 01/20/2020   TRIG 295 (H) 01/20/2020   CHOLHDL 5.9 (H) 01/20/2020   Lab Results  Component Value Date   HGBA1C 6.6 (H) 06/01/2020   Lab Results  Component Value Date   KGYJEHUD14 970 06/06/2020   Lab Results  Component Value Date   TSH 4.252 06/06/2020       ASSESSMENT AND PLAN  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[place]) - Plan: CD19 and CD20, Flow Cytometry, CBC with Differential/Platelet, Neuromyelitis optica autoab, IgG, IgG, IgA, IgM  Encounter for monitoring immunomodulating therapy - Plan: CD19 and CD20, Flow Cytometry, CBC with Differential/Platelet, Neuromyelitis optica autoab, IgG, IgG, IgA, IgM  High risk medication use  Quadriplegia (HCC)  Neurogenic bladder  Neurogenic bowel  Abscess, gluteal, left   1.   I  discussed treatment options for his NMOSD.  Unfortunately, as every treatment (rituximab, Enspryng, Uplinza, Soliris) has a strong effect on the immune system, there can be higher incidence of infection.  His NMOSD has been well controlled on Rituxan and he has had no exacerbations.  He does have a sacral wound.  I spoke with Dr. Welton Flakes and he reports that Brent [name] has done very well and the wound has been healing nicely.  Therefore, I think continued use of Rituxan would be in Brent [name] best interest.  Solaris may have a lower risk of infection in general but has been associated with high risk of meningitis and Brent [name] is not vaccinated.  I would be reluctant to have to delay treatment with Rituxan for several months while we wait for the double vaccination. 2.   We will check some blood work today. 3.   I will see him back in 4 to 6 months but he will return for his infusion in a couple weeks.  They should call sooner if new or worsening neurologic symptoms.  45-minute office visit with the majority of the time spent face-to-face for history and physical, discussion/counseling, discussion with other healthcare providers and decision-making.  Additional time with record review and documentation.    Dilana Mcphie A. Felecia Shelling, MD, [facility] 26/11/7856, 8:50 PM Certified in Neurology, Clinical Neurophysiology, Sleep Medicine and Neuroimaging  [facility] [address], [place], [place] [address] [phone]

## 2020-08-21 DIAGNOSIS — R532 Functional quadriplegia: Secondary | ICD-10-CM | POA: Diagnosis not present

## 2020-08-21 DIAGNOSIS — M6258 Muscle wasting and atrophy, not elsewhere classified, other site: Secondary | ICD-10-CM | POA: Diagnosis not present

## 2020-08-21 DIAGNOSIS — M6281 Muscle weakness (generalized): Secondary | ICD-10-CM | POA: Diagnosis not present

## 2020-08-21 DIAGNOSIS — R627 Adult failure to thrive: Secondary | ICD-10-CM | POA: Diagnosis not present

## 2020-08-21 DIAGNOSIS — M869 Osteomyelitis, unspecified: Secondary | ICD-10-CM | POA: Diagnosis not present

## 2020-08-21 DIAGNOSIS — E440 Moderate protein-calorie malnutrition: Secondary | ICD-10-CM | POA: Diagnosis not present

## 2020-08-21 DIAGNOSIS — R2681 Unsteadiness on feet: Secondary | ICD-10-CM | POA: Diagnosis not present

## 2020-08-21 DIAGNOSIS — R41841 Cognitive communication deficit: Secondary | ICD-10-CM | POA: Diagnosis not present

## 2020-08-21 DIAGNOSIS — Z7409 Other reduced mobility: Secondary | ICD-10-CM | POA: Diagnosis not present

## 2020-08-23 LAB — CBC WITH DIFFERENTIAL/PLATELET
Basophils Absolute: 0 10*3/uL (ref 0.0–0.2)
Basos: 0 %
EOS (ABSOLUTE): 0.2 10*3/uL (ref 0.0–0.4)
Eos: 2 %
Hematocrit: 29.8 % — ABNORMAL LOW (ref 37.5–51.0)
Hemoglobin: 9.2 g/dL — ABNORMAL LOW (ref 13.0–17.7)
Immature Grans (Abs): 0.1 10*3/uL (ref 0.0–0.1)
Immature Granulocytes: 1 %
Lymphocytes Absolute: 1.3 10*3/uL (ref 0.7–3.1)
Lymphs: 11 %
MCH: 25 pg — ABNORMAL LOW (ref 26.6–33.0)
MCHC: 30.9 g/dL — ABNORMAL LOW (ref 31.5–35.7)
MCV: 81 fL (ref 79–97)
Monocytes Absolute: 0.7 10*3/uL (ref 0.1–0.9)
Monocytes: 6 %
Neutrophils Absolute: 9.5 10*3/uL — ABNORMAL HIGH (ref 1.4–7.0)
Neutrophils: 80 %
Platelets: 483 10*3/uL — ABNORMAL HIGH (ref 150–450)
RBC: 3.68 x10E6/uL — ABNORMAL LOW (ref 4.14–5.80)
RDW: 16.2 % — ABNORMAL HIGH (ref 11.6–15.4)
WBC: 11.9 10*3/uL — ABNORMAL HIGH (ref 3.4–10.8)

## 2020-08-23 LAB — NEUROMYELITIS OPTICA AUTOAB, IGG: NMO IgG Autoantibodies: 4.8 U/mL — ABNORMAL HIGH (ref 0.0–3.0)

## 2020-08-23 LAB — CD19 AND CD20, FLOW CYTOMETRY

## 2020-08-24 DIAGNOSIS — R2681 Unsteadiness on feet: Secondary | ICD-10-CM | POA: Diagnosis not present

## 2020-08-24 DIAGNOSIS — Z7409 Other reduced mobility: Secondary | ICD-10-CM | POA: Diagnosis not present

## 2020-08-24 DIAGNOSIS — R532 Functional quadriplegia: Secondary | ICD-10-CM | POA: Diagnosis not present

## 2020-08-24 DIAGNOSIS — R110 Nausea: Secondary | ICD-10-CM | POA: Diagnosis not present

## 2020-08-24 DIAGNOSIS — M6281 Muscle weakness (generalized): Secondary | ICD-10-CM | POA: Diagnosis not present

## 2020-08-24 DIAGNOSIS — E1165 Type 2 diabetes mellitus with hyperglycemia: Secondary | ICD-10-CM | POA: Diagnosis not present

## 2020-08-24 DIAGNOSIS — R41841 Cognitive communication deficit: Secondary | ICD-10-CM | POA: Diagnosis not present

## 2020-08-24 DIAGNOSIS — M6258 Muscle wasting and atrophy, not elsewhere classified, other site: Secondary | ICD-10-CM | POA: Diagnosis not present

## 2020-08-24 DIAGNOSIS — E871 Hypo-osmolality and hyponatremia: Secondary | ICD-10-CM | POA: Diagnosis not present

## 2020-08-25 DIAGNOSIS — R2681 Unsteadiness on feet: Secondary | ICD-10-CM | POA: Diagnosis not present

## 2020-08-25 DIAGNOSIS — M6281 Muscle weakness (generalized): Secondary | ICD-10-CM | POA: Diagnosis not present

## 2020-08-25 DIAGNOSIS — R41841 Cognitive communication deficit: Secondary | ICD-10-CM | POA: Diagnosis not present

## 2020-08-25 DIAGNOSIS — R532 Functional quadriplegia: Secondary | ICD-10-CM | POA: Diagnosis not present

## 2020-08-25 DIAGNOSIS — M6258 Muscle wasting and atrophy, not elsewhere classified, other site: Secondary | ICD-10-CM | POA: Diagnosis not present

## 2020-08-25 DIAGNOSIS — Z7409 Other reduced mobility: Secondary | ICD-10-CM | POA: Diagnosis not present

## 2020-08-26 DIAGNOSIS — Z7409 Other reduced mobility: Secondary | ICD-10-CM | POA: Diagnosis not present

## 2020-08-26 DIAGNOSIS — R532 Functional quadriplegia: Secondary | ICD-10-CM | POA: Diagnosis not present

## 2020-08-26 DIAGNOSIS — R2681 Unsteadiness on feet: Secondary | ICD-10-CM | POA: Diagnosis not present

## 2020-08-26 DIAGNOSIS — M6281 Muscle weakness (generalized): Secondary | ICD-10-CM | POA: Diagnosis not present

## 2020-08-26 DIAGNOSIS — M5459 Other low back pain: Secondary | ICD-10-CM | POA: Diagnosis not present

## 2020-08-26 DIAGNOSIS — R110 Nausea: Secondary | ICD-10-CM | POA: Diagnosis not present

## 2020-08-26 DIAGNOSIS — M6258 Muscle wasting and atrophy, not elsewhere classified, other site: Secondary | ICD-10-CM | POA: Diagnosis not present

## 2020-08-26 DIAGNOSIS — R41841 Cognitive communication deficit: Secondary | ICD-10-CM | POA: Diagnosis not present

## 2020-08-27 DIAGNOSIS — M6258 Muscle wasting and atrophy, not elsewhere classified, other site: Secondary | ICD-10-CM | POA: Diagnosis not present

## 2020-08-27 DIAGNOSIS — R2681 Unsteadiness on feet: Secondary | ICD-10-CM | POA: Diagnosis not present

## 2020-08-27 DIAGNOSIS — R532 Functional quadriplegia: Secondary | ICD-10-CM | POA: Diagnosis not present

## 2020-08-27 DIAGNOSIS — M6281 Muscle weakness (generalized): Secondary | ICD-10-CM | POA: Diagnosis not present

## 2020-08-27 DIAGNOSIS — R41841 Cognitive communication deficit: Secondary | ICD-10-CM | POA: Diagnosis not present

## 2020-08-27 DIAGNOSIS — Z7409 Other reduced mobility: Secondary | ICD-10-CM | POA: Diagnosis not present

## 2020-08-28 DIAGNOSIS — R2681 Unsteadiness on feet: Secondary | ICD-10-CM | POA: Diagnosis not present

## 2020-08-28 DIAGNOSIS — M6258 Muscle wasting and atrophy, not elsewhere classified, other site: Secondary | ICD-10-CM | POA: Diagnosis not present

## 2020-08-28 DIAGNOSIS — Z7409 Other reduced mobility: Secondary | ICD-10-CM | POA: Diagnosis not present

## 2020-08-28 DIAGNOSIS — M6281 Muscle weakness (generalized): Secondary | ICD-10-CM | POA: Diagnosis not present

## 2020-08-28 DIAGNOSIS — R532 Functional quadriplegia: Secondary | ICD-10-CM | POA: Diagnosis not present

## 2020-08-28 DIAGNOSIS — R41841 Cognitive communication deficit: Secondary | ICD-10-CM | POA: Diagnosis not present

## 2020-08-29 DIAGNOSIS — Z7409 Other reduced mobility: Secondary | ICD-10-CM | POA: Diagnosis not present

## 2020-08-29 DIAGNOSIS — R532 Functional quadriplegia: Secondary | ICD-10-CM | POA: Diagnosis not present

## 2020-08-29 DIAGNOSIS — M6258 Muscle wasting and atrophy, not elsewhere classified, other site: Secondary | ICD-10-CM | POA: Diagnosis not present

## 2020-08-29 DIAGNOSIS — R2681 Unsteadiness on feet: Secondary | ICD-10-CM | POA: Diagnosis not present

## 2020-08-29 DIAGNOSIS — M6281 Muscle weakness (generalized): Secondary | ICD-10-CM | POA: Diagnosis not present

## 2020-08-29 DIAGNOSIS — R41841 Cognitive communication deficit: Secondary | ICD-10-CM | POA: Diagnosis not present

## 2020-08-31 ENCOUNTER — Telehealth: Payer: Medicare Other

## 2020-08-31 DIAGNOSIS — M6281 Muscle weakness (generalized): Secondary | ICD-10-CM | POA: Diagnosis not present

## 2020-08-31 DIAGNOSIS — M6258 Muscle wasting and atrophy, not elsewhere classified, other site: Secondary | ICD-10-CM | POA: Diagnosis not present

## 2020-08-31 DIAGNOSIS — Z7409 Other reduced mobility: Secondary | ICD-10-CM | POA: Diagnosis not present

## 2020-08-31 DIAGNOSIS — R532 Functional quadriplegia: Secondary | ICD-10-CM | POA: Diagnosis not present

## 2020-08-31 DIAGNOSIS — R2681 Unsteadiness on feet: Secondary | ICD-10-CM | POA: Diagnosis not present

## 2020-08-31 DIAGNOSIS — R41841 Cognitive communication deficit: Secondary | ICD-10-CM | POA: Diagnosis not present

## 2020-08-31 NOTE — Chronic Care Management (AMB) (Deleted)
Chronic Care Management Pharmacy  Name: Brent [name].  MRN: [id number] DOB: 08/09/1946  Initial Questions: 1. Have you seen any other providers since your last visit? NA 2. Any changes in your medicines or health? No   Chief Complaint/ HPI  Brent [name].,  74 y.o. , male presents for their Follow-Up CCM visit with the clinical pharmacist via telephone due to COVID-19 Pandemic.  PCP : [name], [name], MD  Their chronic conditions include: HTN, DM, HLD, History of MI, coronary atherosclerosis, LUTS, insomnia, depression, allergic rhinits   Office Visits: 08/23/2019- Lelon Frohlich, MD- patient presented for office visit for follow up. A1c: 6.4. Patient to continue Lantus 50 units at bedtime, Novolog sliding scale, and Metformin 1040m twice daily. Patient to continue all other medication. Follow up in 3 months.   Consult Visit: 08/20/20 RArlice Colt MD (neurology): Patient presented for neuromyelitis optica follow up. Increase gabapentin to 300 mg TID.   10/09/2019- Surgical Oncology- DBill Salinas MD- Patient presented for office visit for follow up. 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 Patient reported no new complaints. Patient to alternate visits between dermatology and return in 6 months.   09/02/2019- Urology- RNelma Rothman MD- Patient presented for office visit for recurrent renal cyst. Patient to monitor ulcer and call back for biopsy. Renal UKorealooked good. Patient to return in a year for renal UKoreaand OV. [facility]   08/09/2019- Pulmonology- CBaird Lyons MD- Patient presented for office visit for OSA, insomnia. Patient reports being slack with CPAP. Patient instructed to use CPAP 17 all night, every night. Patient to return in 1 year.   Medications: Outpatient Encounter Medications as of 08/31/2020  Medication Sig  .  acetaminophen (TYLENOL) 325 MG tablet Take 2 tablets (650 mg total) by mouth every 6 (six) hours as needed for headache.  .Marland Kitchenacetaminophen (TYLENOL) 500 MG tablet Take 500 mg by mouth 2 (two) times daily as needed.  .Marland Kitchenalbuterol (PROVENTIL) (2.5 MG/3ML) 0.083% nebulizer solution Take 3 mLs (2.5 mg total) by nebulization once as needed for wheezing or shortness of breath (or anaphylaxis due to Rituxan infusion).  . Amino Acids-Protein Hydrolys (FEEDING SUPPLEMENT, PRO-STAT SUGAR FREE 64,) LIQD Take 30 mLs by mouth in the morning and at bedtime.  .Marland Kitchenantiseptic oral rinse (BIOTENE) LIQD 10 mLs by Mouth Rinse route 2 (two) times daily. Rinse and spit  . Ascorbic Acid (VITA-C PO) Take 500 mg by mouth daily.  .Marland Kitchenaspirin EC 81 MG tablet Take 81 mg by mouth at bedtime. Swallow whole.  .Marland Kitchenatorvastatin (LIPITOR) 20 MG tablet Take 1 tablet (20 mg total) by mouth daily.  . cholestyramine (QUESTRAN) 4 g packet Take 4 g by mouth daily.  . collagenase (SANTYL) ointment Cleanse sacral area and then apply a layer of santyl with damp to dry dressing. Change daily and prn if soiled (Patient taking differently: Apply 1 application topically. Cleanse sacral area and then apply a layer of santyl with damp to dry dressing. Change daily and prn if soiled)  . diclofenac Sodium (VOLTAREN) 1 % GEL Apply 2 g topically 2 (two) times daily. Apply to Right shoulder  . ferrous sulfate 300 (60 Fe) MG/5ML syrup Take 300 mg by mouth daily.  . fluticasone (FLONASE) 50 MCG/ACT nasal spray Place 2 sprays into both nostrils 2 (two) times daily.  .Marland Kitchengabapentin (  NEURONTIN) 300 MG capsule Place 1 capsule (300 mg total) into feeding tube at bedtime. (Patient taking differently: Take 300 mg by mouth 3 (three) times daily. )  . Icosapent Ethyl (VASCEPA) 1 g CAPS Take 2 capsules (2 g total) by mouth 2 (two) times daily.  . insulin aspart (NOVOLOG) 100 UNIT/ML injection Inject 0-15 Units into the skin every 4 (four) hours. Sliding scale  CBG 70 - 120: 0  units: CBG 121 - 150: 2 units; CBG 151 - 200: 3 units; CBG 201 - 250: 5 units; CBG 251 - 300: 8 units;CBG 301 - 350: 11 units; CBG 351 - 400: 15 units; CBG > 400 : 15 units and notify MD  . insulin glargine (LANTUS) 100 unit/mL SOPN Inject 15 Units into the skin at bedtime. (Patient taking differently: Inject 38 Units into the skin at bedtime. )  . lactulose (CHRONULAC) 10 GM/15ML solution Take 15 g by mouth as needed for mild constipation.  . Lidocaine (HM LIDOCAINE PATCH) 4 % PTCH Apply 1 patch topically in the morning and at bedtime.  . Menthol, Topical Analgesic, (BIOFREEZE) 4 % GEL Apply 1 application topically in the morning, at noon, and at bedtime.   . metFORMIN (GLUCOPHAGE) 500 MG tablet Take 1,000 mg by mouth daily with breakfast. 1056m in the am, [id number]  . Multiple Vitamin (MULTIVITAMIN WITH MINERALS) TABS tablet Place 1 tablet into feeding tube daily. Centrum Silver Men's  . ondansetron (ZOFRAN-ODT) 4 MG disintegrating tablet Take 1 tablet (4 mg total) by mouth 2 (two) times daily. X 21 days  . pantoprazole sodium (PROTONIX) 40 mg/20 mL PACK Place 20 mLs (40 mg total) into feeding tube daily. (Patient taking differently: Take 40 mg by mouth 2 (two) times daily. )  . polyvinyl alcohol (LIQUIFILM TEARS) 1.4 % ophthalmic solution Place 2 drops into both eyes as needed for dry eyes.  . Marland Kitchenaccharomyces boulardii (FLORASTOR) 250 MG capsule Place 1 capsule (250 mg total) into feeding tube 2 (two) times daily.  . Marland Kitchenenna-docusate (SENOKOT-S) 8.6-50 MG tablet Take 1 tablet by mouth daily.  . sertraline (ZOLOFT) 100 MG tablet Take 100 mg by mouth at bedtime.   . tamsulosin (FLOMAX) 0.4 MG CAPS capsule Take 1 capsule (0.4 mg total) by mouth daily after supper.  . traMADol (ULTRAM) 50 MG tablet Take 1 tablet (50 mg total) by mouth every 12 (twelve) hours as needed for moderate pain.  . traZODone (DESYREL) 50 MG tablet Take 50 mg by mouth at bedtime.  . vitamin B-12 (CYANOCOBALAMIN) 500 MCG tablet  Take 500 mcg by mouth daily.  . Marland Kitcheninc sulfate 220 (50 Zn) MG capsule Place 1 capsule (220 mg total) into feeding tube daily. (Patient taking differently: Take 220 mg by mouth daily. )   No facility-administered encounter medications on file as of 08/31/2020.     Current Diagnosis/Assessment:  Goals Addressed   None     Diabetes    Patient reports having calluses and wants to get them taken care of by a podiatrist. He requested referral to podiatry since [facility] a while.  Patient endorses previous episodes of hypoglycemia when BG was 71. In the past, has gone down to 50s, but states it has been "awhile" (unable to give time estimate). Patient reports drinking small amount of cranberry juice to help bring BGs up.   Recent Relevant Labs: Lab Results  Component Value Date/Time   HGBA1C 6.6 (H) 06/01/2020 12:48 PM   HGBA1C 6.7 (H) 03/09/2020  04:04 AM   MICROALBUR 4.2 (H) 03/18/2015 08:33 AM   MICROALBUR 1.7 01/09/2013 09:49 AM    Checking BG: 4x per day  Patient did not bring meter and could not recall BG number, but states overall his BGs have been doing well.   Recent FBG Readings: Not available Recent pre-meal BG readings: not available Recent 2hr PP BG readings:  Not available Recent HS BG readings:127   Patient has failed these meds in past: none  Patient is currently controlled on the following medications:   Insulin aspart (Novolog Flexpen), inject 20 to 30 units three times daily with meals  Insulin glargine (Lantus), inject 60 units at bedtime   Metformin 1075m, 1 tablet twice daily with a meal   Jardiance 12.522m 1 tablet once daily    Last diabetic Eye exam:  Lab Results  Component Value Date/Time   HMDIABEYEEXA No Retinopathy 03/28/2019 12:00 AM   - patient reports obtaining once a year   Last diabetic Foot exam: No results found for: HMDIABFOOTEX   We discussed:  how to recognize and treat signs of hypoglycemia   Benefits of  CGM in detecting hypoglycemia at night   Plan Continue current medications  Request referral to podiatry.  [facility]   Hypertension  Patient reported not really having high blood pressure, but taking lisinopril for kidney protection)   Office blood pressures are  BP Readings from Last 3 Encounters:  08/20/20 123/61  07/23/20 129/66  06/10/20 (!) 116/51   Patient has failed these meds in the past: losartan   Patient checks BP at home daily  Patient home BP readings are ranging: unable to provide readings (patient states doing well)    Patient is controlled on:   Lisinopril 2050m tablet once daily   Plan Continue current medications   Hyperlipidemia  Patient endorses having problems with elevated triglycerides almost all his life.   Diet- Patient reports his diet consists of mostly chicken: grilled, baked BBQ chicken, salads, vegetables. He states he takes care of his 11 86ar-old grandson so will make him hotdogs and he will eat that too. He endorses his diet can be better, and will aim to make small changes.   Exercise: patient reports being active and going to the gym. He reports feeling better when he was active. Now he states he does not have much time going to gym as he takes care of his grandson, but does remain active when taking care of grandson.   LDL goal < 70   Lipid Panel     Component Value Date/Time   CHOL 135 01/20/2020 0754   TRIG 295 (H) 01/20/2020 0754   HDL 23 (L) 01/20/2020 0754   LDLCALC 65 01/20/2020 0754   LDLDIRECT 68 01/20/2020 0754   LDLDIRECT 77.0 04/15/2016 1133    Hepatic Function Latest Ref Rng & Units 06/01/2020 04/22/2020 04/03/2020  Total Protein 6.5 - 8.1 g/dL 5.3(L) 5.9(L) 6.1(L)  Albumin 3.5 - 5.0 g/dL 1.5(L) 2.6(L) 2.2(L)  AST 15 - 41 U/L 62(H) 45(H) 54(H)  ALT 0 - 44 U/L 68(H) 42 38  Alk Phosphatase 38 - 126 U/L 134(H) 51 61  Total Bilirubin 0.3 - 1.2 mg/dL 0.7 0.3 0.2(L)   Bilirubin, Direct 0.00 - 0.40 mg/dL - - -     The ASCVD Risk score (GoMikey Bussing Jr., et al., 2013) failed to calculate for the following reasons:   The patient has a prior MI  or stroke diagnosis   Patient has failed these meds in past: none   Patient is currently controlled on the following medications:   Fenofibrate 132m, 1 tablet once daily  Vascepa 1g, 2 capsules twice daily  Rosuvastatin 476m 1 tablet once daily   We discussed:  diet and exercise extensively  Plan Continue current medications  Patient to obtain repeat labs in 6 months.  Managed by lipid clinic.   Hx of MI/ coronary atherosclerosis   Patient has failed these meds in past: none   Patient is currently controlled on the following medications:   Aspirin 8177m1 tablet once daily   Plan Managed by cardiologist (McAngelena FormContinue current medications  Allergic rhinitis    Patient is currently controlled on the following medications:  . Fluticasone (Flonase), 2 sprays into each nostril every day   Plan Continue current medications  LUTS  Patient reports taking oxybutynin on and off. Previously, he reports taking BID, but felt he was not going to the restroom at all. When he decreased to once daily, he felt that did not control his symptoms. Patient reports having a follow up visit with urologist coming up and will discuss with him.  Patient has failed these meds in past: none   Patient is currently uncontrolled on the following medications:  . Oxybutynin 5mg69m tablet once daily  Plan Managed by urology (Dr. DaviRosana Hoesatient to follow-up at next visit for further management.   Depression/ anxiety   PHQ9 SCORE ONLY 07/23/2020 03/06/2020 12/14/2018  PHQ-9 Total Score 0 0 4   Patient is currently controlled on the following medications:  . Lorazepam 1mg,72mtablet twice daily as needed for anxiety (patient reports takes mostly 1 tablet once daily) . Sertraline 100mg,34mmg o30mdaily    Plan Continue current medications  Insomnia   Patient reports insomnia is controlled with zolpidem. He reports, if he does not take it, he does not sleep. He denies  Patient reports using CPAP, but states that does not bother him as it did before and states full compliance.   Patient has failed these meds in past: none   Patient is currently controlled on the following medications:  . ZolpiMarland Kitchenem 10mg, 152mlet at bedtime as needed for insomnia   We discussed:    Cautioned use in older adults   Plan Continue current medications  OTC/ supplements   Patient reports being told to take vitamin B12 since he was diagnosed with diabetes and placed on metformin.   Vitamin B-12  Date Value Ref Range Status  06/06/2020 781 180 - 914 pg/mL Final    Comment:    (NOTE) This assay is not validated for testing neonatal or myeloproliferative syndrome specimens for Vitamin B12 levels. Performed at [facility]. [place],[address]   [address] is currently on the following medications:  . VitamiMarland Kitchen B12 1000mcg, 116mlet once daily   Plan Continue current medications  Medication Management  Patient organizes medications: patient uses own strategy; pill box and separates by AM and PM  [facility]  Adherence: unable to determine- information not available through dispense report as [facility] Follow up visit with PharmD in 6 months.   .Marland Kitchen

## 2020-09-01 ENCOUNTER — Other Ambulatory Visit: Payer: Self-pay | Admitting: *Deleted

## 2020-09-01 DIAGNOSIS — R2681 Unsteadiness on feet: Secondary | ICD-10-CM | POA: Diagnosis not present

## 2020-09-01 DIAGNOSIS — Z7401 Bed confinement status: Secondary | ICD-10-CM | POA: Diagnosis not present

## 2020-09-01 DIAGNOSIS — R41841 Cognitive communication deficit: Secondary | ICD-10-CM | POA: Diagnosis not present

## 2020-09-01 DIAGNOSIS — M6258 Muscle wasting and atrophy, not elsewhere classified, other site: Secondary | ICD-10-CM | POA: Diagnosis not present

## 2020-09-01 DIAGNOSIS — G360 Neuromyelitis optica [Devic]: Secondary | ICD-10-CM | POA: Diagnosis not present

## 2020-09-01 DIAGNOSIS — G373 Acute transverse myelitis in demyelinating disease of central nervous system: Secondary | ICD-10-CM | POA: Diagnosis not present

## 2020-09-01 DIAGNOSIS — E118 Type 2 diabetes mellitus with unspecified complications: Secondary | ICD-10-CM | POA: Diagnosis not present

## 2020-09-01 DIAGNOSIS — R532 Functional quadriplegia: Secondary | ICD-10-CM | POA: Diagnosis not present

## 2020-09-01 DIAGNOSIS — M2550 Pain in unspecified joint: Secondary | ICD-10-CM | POA: Diagnosis not present

## 2020-09-01 DIAGNOSIS — G8250 Quadriplegia, unspecified: Secondary | ICD-10-CM | POA: Diagnosis not present

## 2020-09-01 DIAGNOSIS — R5381 Other malaise: Secondary | ICD-10-CM | POA: Diagnosis not present

## 2020-09-01 DIAGNOSIS — Z7409 Other reduced mobility: Secondary | ICD-10-CM | POA: Diagnosis not present

## 2020-09-01 DIAGNOSIS — M6281 Muscle weakness (generalized): Secondary | ICD-10-CM | POA: Diagnosis not present

## 2020-09-01 DIAGNOSIS — Z794 Long term (current) use of insulin: Secondary | ICD-10-CM | POA: Diagnosis not present

## 2020-09-01 NOTE — Patient Outreach (Signed)
[place] Coordinator follow up. Member screened for potential [facility] Care Management needs as a benefit of [place] [employer].  Mr. Tortorella resides in [place] [facility] under private pay. No identifiable [facility] Care Management needs at this time.    Marthenia Rolling, MSN, RN,BSN [place] Acute Care Coordinator [phone] [facility]) [phone]  (Toll free office)

## 2020-09-02 DIAGNOSIS — Z7409 Other reduced mobility: Secondary | ICD-10-CM | POA: Diagnosis not present

## 2020-09-02 DIAGNOSIS — R41841 Cognitive communication deficit: Secondary | ICD-10-CM | POA: Diagnosis not present

## 2020-09-02 DIAGNOSIS — M6258 Muscle wasting and atrophy, not elsewhere classified, other site: Secondary | ICD-10-CM | POA: Diagnosis not present

## 2020-09-02 DIAGNOSIS — M6281 Muscle weakness (generalized): Secondary | ICD-10-CM | POA: Diagnosis not present

## 2020-09-02 DIAGNOSIS — R2681 Unsteadiness on feet: Secondary | ICD-10-CM | POA: Diagnosis not present

## 2020-09-02 DIAGNOSIS — R532 Functional quadriplegia: Secondary | ICD-10-CM | POA: Diagnosis not present

## 2020-09-02 DIAGNOSIS — G373 Acute transverse myelitis in demyelinating disease of central nervous system: Secondary | ICD-10-CM | POA: Diagnosis not present

## 2020-09-03 DIAGNOSIS — R2681 Unsteadiness on feet: Secondary | ICD-10-CM | POA: Diagnosis not present

## 2020-09-03 DIAGNOSIS — R41841 Cognitive communication deficit: Secondary | ICD-10-CM | POA: Diagnosis not present

## 2020-09-03 DIAGNOSIS — M6258 Muscle wasting and atrophy, not elsewhere classified, other site: Secondary | ICD-10-CM | POA: Diagnosis not present

## 2020-09-03 DIAGNOSIS — M6281 Muscle weakness (generalized): Secondary | ICD-10-CM | POA: Diagnosis not present

## 2020-09-03 DIAGNOSIS — Z7409 Other reduced mobility: Secondary | ICD-10-CM | POA: Diagnosis not present

## 2020-09-03 DIAGNOSIS — R532 Functional quadriplegia: Secondary | ICD-10-CM | POA: Diagnosis not present

## 2020-09-04 DIAGNOSIS — R532 Functional quadriplegia: Secondary | ICD-10-CM | POA: Diagnosis not present

## 2020-09-04 DIAGNOSIS — R2681 Unsteadiness on feet: Secondary | ICD-10-CM | POA: Diagnosis not present

## 2020-09-04 DIAGNOSIS — R41841 Cognitive communication deficit: Secondary | ICD-10-CM | POA: Diagnosis not present

## 2020-09-04 DIAGNOSIS — M6281 Muscle weakness (generalized): Secondary | ICD-10-CM | POA: Diagnosis not present

## 2020-09-04 DIAGNOSIS — Z7409 Other reduced mobility: Secondary | ICD-10-CM | POA: Diagnosis not present

## 2020-09-04 DIAGNOSIS — M6258 Muscle wasting and atrophy, not elsewhere classified, other site: Secondary | ICD-10-CM | POA: Diagnosis not present

## 2020-09-05 DIAGNOSIS — Z7409 Other reduced mobility: Secondary | ICD-10-CM | POA: Diagnosis not present

## 2020-09-05 DIAGNOSIS — R2681 Unsteadiness on feet: Secondary | ICD-10-CM | POA: Diagnosis not present

## 2020-09-05 DIAGNOSIS — R532 Functional quadriplegia: Secondary | ICD-10-CM | POA: Diagnosis not present

## 2020-09-05 DIAGNOSIS — M6258 Muscle wasting and atrophy, not elsewhere classified, other site: Secondary | ICD-10-CM | POA: Diagnosis not present

## 2020-09-05 DIAGNOSIS — M6281 Muscle weakness (generalized): Secondary | ICD-10-CM | POA: Diagnosis not present

## 2020-09-05 DIAGNOSIS — R41841 Cognitive communication deficit: Secondary | ICD-10-CM | POA: Diagnosis not present

## 2020-09-07 DIAGNOSIS — L89314 Pressure ulcer of right buttock, stage 4: Secondary | ICD-10-CM | POA: Diagnosis not present

## 2020-09-07 DIAGNOSIS — F411 Generalized anxiety disorder: Secondary | ICD-10-CM | POA: Diagnosis not present

## 2020-09-07 DIAGNOSIS — G4700 Insomnia, unspecified: Secondary | ICD-10-CM | POA: Diagnosis not present

## 2020-09-07 DIAGNOSIS — M6258 Muscle wasting and atrophy, not elsewhere classified, other site: Secondary | ICD-10-CM | POA: Diagnosis not present

## 2020-09-07 DIAGNOSIS — F331 Major depressive disorder, recurrent, moderate: Secondary | ICD-10-CM | POA: Diagnosis not present

## 2020-09-07 DIAGNOSIS — M6281 Muscle weakness (generalized): Secondary | ICD-10-CM | POA: Diagnosis not present

## 2020-09-07 DIAGNOSIS — G373 Acute transverse myelitis in demyelinating disease of central nervous system: Secondary | ICD-10-CM | POA: Diagnosis not present

## 2020-09-07 DIAGNOSIS — R41841 Cognitive communication deficit: Secondary | ICD-10-CM | POA: Diagnosis not present

## 2020-09-07 DIAGNOSIS — L89154 Pressure ulcer of sacral region, stage 4: Secondary | ICD-10-CM | POA: Diagnosis not present

## 2020-09-07 DIAGNOSIS — R2681 Unsteadiness on feet: Secondary | ICD-10-CM | POA: Diagnosis not present

## 2020-09-07 DIAGNOSIS — Z7409 Other reduced mobility: Secondary | ICD-10-CM | POA: Diagnosis not present

## 2020-09-07 DIAGNOSIS — R532 Functional quadriplegia: Secondary | ICD-10-CM | POA: Diagnosis not present

## 2020-09-07 DIAGNOSIS — Z7401 Bed confinement status: Secondary | ICD-10-CM | POA: Diagnosis not present

## 2020-09-07 DIAGNOSIS — R5381 Other malaise: Secondary | ICD-10-CM | POA: Diagnosis not present

## 2020-09-07 DIAGNOSIS — M2550 Pain in unspecified joint: Secondary | ICD-10-CM | POA: Diagnosis not present

## 2020-09-08 DIAGNOSIS — R41841 Cognitive communication deficit: Secondary | ICD-10-CM | POA: Diagnosis not present

## 2020-09-08 DIAGNOSIS — M6258 Muscle wasting and atrophy, not elsewhere classified, other site: Secondary | ICD-10-CM | POA: Diagnosis not present

## 2020-09-08 DIAGNOSIS — Z7409 Other reduced mobility: Secondary | ICD-10-CM | POA: Diagnosis not present

## 2020-09-08 DIAGNOSIS — M6281 Muscle weakness (generalized): Secondary | ICD-10-CM | POA: Diagnosis not present

## 2020-09-08 DIAGNOSIS — R2681 Unsteadiness on feet: Secondary | ICD-10-CM | POA: Diagnosis not present

## 2020-09-08 DIAGNOSIS — R532 Functional quadriplegia: Secondary | ICD-10-CM | POA: Diagnosis not present

## 2020-09-09 DIAGNOSIS — M6258 Muscle wasting and atrophy, not elsewhere classified, other site: Secondary | ICD-10-CM | POA: Diagnosis not present

## 2020-09-09 DIAGNOSIS — Z7409 Other reduced mobility: Secondary | ICD-10-CM | POA: Diagnosis not present

## 2020-09-09 DIAGNOSIS — R532 Functional quadriplegia: Secondary | ICD-10-CM | POA: Diagnosis not present

## 2020-09-09 DIAGNOSIS — M6281 Muscle weakness (generalized): Secondary | ICD-10-CM | POA: Diagnosis not present

## 2020-09-09 DIAGNOSIS — R41841 Cognitive communication deficit: Secondary | ICD-10-CM | POA: Diagnosis not present

## 2020-09-09 DIAGNOSIS — R2681 Unsteadiness on feet: Secondary | ICD-10-CM | POA: Diagnosis not present

## 2020-09-10 DIAGNOSIS — M6281 Muscle weakness (generalized): Secondary | ICD-10-CM | POA: Diagnosis not present

## 2020-09-10 DIAGNOSIS — M6258 Muscle wasting and atrophy, not elsewhere classified, other site: Secondary | ICD-10-CM | POA: Diagnosis not present

## 2020-09-10 DIAGNOSIS — R532 Functional quadriplegia: Secondary | ICD-10-CM | POA: Diagnosis not present

## 2020-09-10 DIAGNOSIS — R41841 Cognitive communication deficit: Secondary | ICD-10-CM | POA: Diagnosis not present

## 2020-09-10 DIAGNOSIS — R2681 Unsteadiness on feet: Secondary | ICD-10-CM | POA: Diagnosis not present

## 2020-09-10 DIAGNOSIS — Z7409 Other reduced mobility: Secondary | ICD-10-CM | POA: Diagnosis not present

## 2020-09-14 DIAGNOSIS — M6258 Muscle wasting and atrophy, not elsewhere classified, other site: Secondary | ICD-10-CM | POA: Diagnosis not present

## 2020-09-14 DIAGNOSIS — Z7409 Other reduced mobility: Secondary | ICD-10-CM | POA: Diagnosis not present

## 2020-09-14 DIAGNOSIS — R2681 Unsteadiness on feet: Secondary | ICD-10-CM | POA: Diagnosis not present

## 2020-09-14 DIAGNOSIS — M6281 Muscle weakness (generalized): Secondary | ICD-10-CM | POA: Diagnosis not present

## 2020-09-14 DIAGNOSIS — R41841 Cognitive communication deficit: Secondary | ICD-10-CM | POA: Diagnosis not present

## 2020-09-14 DIAGNOSIS — R532 Functional quadriplegia: Secondary | ICD-10-CM | POA: Diagnosis not present

## 2020-09-15 DIAGNOSIS — Z7409 Other reduced mobility: Secondary | ICD-10-CM | POA: Diagnosis not present

## 2020-09-15 DIAGNOSIS — R2681 Unsteadiness on feet: Secondary | ICD-10-CM | POA: Diagnosis not present

## 2020-09-15 DIAGNOSIS — R532 Functional quadriplegia: Secondary | ICD-10-CM | POA: Diagnosis not present

## 2020-09-15 DIAGNOSIS — R41841 Cognitive communication deficit: Secondary | ICD-10-CM | POA: Diagnosis not present

## 2020-09-15 DIAGNOSIS — M6281 Muscle weakness (generalized): Secondary | ICD-10-CM | POA: Diagnosis not present

## 2020-09-15 DIAGNOSIS — R5381 Other malaise: Secondary | ICD-10-CM | POA: Diagnosis not present

## 2020-09-15 DIAGNOSIS — Z7401 Bed confinement status: Secondary | ICD-10-CM | POA: Diagnosis not present

## 2020-09-15 DIAGNOSIS — G360 Neuromyelitis optica [Devic]: Secondary | ICD-10-CM | POA: Diagnosis not present

## 2020-09-15 DIAGNOSIS — G373 Acute transverse myelitis in demyelinating disease of central nervous system: Secondary | ICD-10-CM | POA: Diagnosis not present

## 2020-09-15 DIAGNOSIS — M6258 Muscle wasting and atrophy, not elsewhere classified, other site: Secondary | ICD-10-CM | POA: Diagnosis not present

## 2020-09-15 DIAGNOSIS — M2550 Pain in unspecified joint: Secondary | ICD-10-CM | POA: Diagnosis not present

## 2020-09-16 DIAGNOSIS — Z7409 Other reduced mobility: Secondary | ICD-10-CM | POA: Diagnosis not present

## 2020-09-16 DIAGNOSIS — R2242 Localized swelling, mass and lump, left lower limb: Secondary | ICD-10-CM | POA: Diagnosis not present

## 2020-09-16 DIAGNOSIS — M6258 Muscle wasting and atrophy, not elsewhere classified, other site: Secondary | ICD-10-CM | POA: Diagnosis not present

## 2020-09-16 DIAGNOSIS — R2681 Unsteadiness on feet: Secondary | ICD-10-CM | POA: Diagnosis not present

## 2020-09-16 DIAGNOSIS — L89600 Pressure ulcer of unspecified heel, unstageable: Secondary | ICD-10-CM | POA: Diagnosis not present

## 2020-09-16 DIAGNOSIS — D75838 Other thrombocytosis: Secondary | ICD-10-CM | POA: Diagnosis not present

## 2020-09-16 DIAGNOSIS — R41841 Cognitive communication deficit: Secondary | ICD-10-CM | POA: Diagnosis not present

## 2020-09-16 DIAGNOSIS — R532 Functional quadriplegia: Secondary | ICD-10-CM | POA: Diagnosis not present

## 2020-09-16 DIAGNOSIS — R600 Localized edema: Secondary | ICD-10-CM | POA: Diagnosis not present

## 2020-09-16 DIAGNOSIS — M6281 Muscle weakness (generalized): Secondary | ICD-10-CM | POA: Diagnosis not present

## 2020-09-17 DIAGNOSIS — K219 Gastro-esophageal reflux disease without esophagitis: Secondary | ICD-10-CM | POA: Diagnosis not present

## 2020-09-17 DIAGNOSIS — M7989 Other specified soft tissue disorders: Secondary | ICD-10-CM | POA: Diagnosis not present

## 2020-09-17 DIAGNOSIS — R600 Localized edema: Secondary | ICD-10-CM | POA: Diagnosis not present

## 2020-09-17 DIAGNOSIS — Z931 Gastrostomy status: Secondary | ICD-10-CM | POA: Diagnosis not present

## 2020-09-17 DIAGNOSIS — M79602 Pain in left arm: Secondary | ICD-10-CM | POA: Diagnosis not present

## 2020-09-18 DIAGNOSIS — M6281 Muscle weakness (generalized): Secondary | ICD-10-CM | POA: Diagnosis not present

## 2020-09-18 DIAGNOSIS — R41841 Cognitive communication deficit: Secondary | ICD-10-CM | POA: Diagnosis not present

## 2020-09-18 DIAGNOSIS — Z7409 Other reduced mobility: Secondary | ICD-10-CM | POA: Diagnosis not present

## 2020-09-18 DIAGNOSIS — R532 Functional quadriplegia: Secondary | ICD-10-CM | POA: Diagnosis not present

## 2020-09-18 DIAGNOSIS — R600 Localized edema: Secondary | ICD-10-CM | POA: Diagnosis not present

## 2020-09-18 DIAGNOSIS — R2242 Localized swelling, mass and lump, left lower limb: Secondary | ICD-10-CM | POA: Diagnosis not present

## 2020-09-18 DIAGNOSIS — M6258 Muscle wasting and atrophy, not elsewhere classified, other site: Secondary | ICD-10-CM | POA: Diagnosis not present

## 2020-09-18 DIAGNOSIS — R2681 Unsteadiness on feet: Secondary | ICD-10-CM | POA: Diagnosis not present

## 2020-09-19 DIAGNOSIS — R2681 Unsteadiness on feet: Secondary | ICD-10-CM | POA: Diagnosis not present

## 2020-09-19 DIAGNOSIS — R1312 Dysphagia, oropharyngeal phase: Secondary | ICD-10-CM | POA: Diagnosis not present

## 2020-09-19 DIAGNOSIS — G373 Acute transverse myelitis in demyelinating disease of central nervous system: Secondary | ICD-10-CM | POA: Diagnosis not present

## 2020-09-19 DIAGNOSIS — Z7409 Other reduced mobility: Secondary | ICD-10-CM | POA: Diagnosis not present

## 2020-09-19 DIAGNOSIS — R532 Functional quadriplegia: Secondary | ICD-10-CM | POA: Diagnosis not present

## 2020-09-19 DIAGNOSIS — M6258 Muscle wasting and atrophy, not elsewhere classified, other site: Secondary | ICD-10-CM | POA: Diagnosis not present

## 2020-09-19 DIAGNOSIS — M6281 Muscle weakness (generalized): Secondary | ICD-10-CM | POA: Diagnosis not present

## 2020-09-19 DIAGNOSIS — G8250 Quadriplegia, unspecified: Secondary | ICD-10-CM | POA: Diagnosis not present

## 2020-09-19 DIAGNOSIS — R278 Other lack of coordination: Secondary | ICD-10-CM | POA: Diagnosis not present

## 2020-09-19 DIAGNOSIS — R41841 Cognitive communication deficit: Secondary | ICD-10-CM | POA: Diagnosis not present

## 2020-09-20 DIAGNOSIS — R41841 Cognitive communication deficit: Secondary | ICD-10-CM | POA: Diagnosis not present

## 2020-09-20 DIAGNOSIS — Z7409 Other reduced mobility: Secondary | ICD-10-CM | POA: Diagnosis not present

## 2020-09-20 DIAGNOSIS — M6281 Muscle weakness (generalized): Secondary | ICD-10-CM | POA: Diagnosis not present

## 2020-09-20 DIAGNOSIS — M6258 Muscle wasting and atrophy, not elsewhere classified, other site: Secondary | ICD-10-CM | POA: Diagnosis not present

## 2020-09-20 DIAGNOSIS — R2681 Unsteadiness on feet: Secondary | ICD-10-CM | POA: Diagnosis not present

## 2020-09-20 DIAGNOSIS — R532 Functional quadriplegia: Secondary | ICD-10-CM | POA: Diagnosis not present

## 2020-09-21 DIAGNOSIS — M6281 Muscle weakness (generalized): Secondary | ICD-10-CM | POA: Diagnosis not present

## 2020-09-21 DIAGNOSIS — R2681 Unsteadiness on feet: Secondary | ICD-10-CM | POA: Diagnosis not present

## 2020-09-21 DIAGNOSIS — Z7409 Other reduced mobility: Secondary | ICD-10-CM | POA: Diagnosis not present

## 2020-09-21 DIAGNOSIS — R532 Functional quadriplegia: Secondary | ICD-10-CM | POA: Diagnosis not present

## 2020-09-21 DIAGNOSIS — R41841 Cognitive communication deficit: Secondary | ICD-10-CM | POA: Diagnosis not present

## 2020-09-21 DIAGNOSIS — M6258 Muscle wasting and atrophy, not elsewhere classified, other site: Secondary | ICD-10-CM | POA: Diagnosis not present

## 2020-09-22 DIAGNOSIS — R41841 Cognitive communication deficit: Secondary | ICD-10-CM | POA: Diagnosis not present

## 2020-09-22 DIAGNOSIS — R2681 Unsteadiness on feet: Secondary | ICD-10-CM | POA: Diagnosis not present

## 2020-09-22 DIAGNOSIS — R532 Functional quadriplegia: Secondary | ICD-10-CM | POA: Diagnosis not present

## 2020-09-22 DIAGNOSIS — M6281 Muscle weakness (generalized): Secondary | ICD-10-CM | POA: Diagnosis not present

## 2020-09-22 DIAGNOSIS — Z7409 Other reduced mobility: Secondary | ICD-10-CM | POA: Diagnosis not present

## 2020-09-22 DIAGNOSIS — M6258 Muscle wasting and atrophy, not elsewhere classified, other site: Secondary | ICD-10-CM | POA: Diagnosis not present

## 2020-09-23 DIAGNOSIS — M6281 Muscle weakness (generalized): Secondary | ICD-10-CM | POA: Diagnosis not present

## 2020-09-23 DIAGNOSIS — R41841 Cognitive communication deficit: Secondary | ICD-10-CM | POA: Diagnosis not present

## 2020-09-23 DIAGNOSIS — R2681 Unsteadiness on feet: Secondary | ICD-10-CM | POA: Diagnosis not present

## 2020-09-23 DIAGNOSIS — Z7409 Other reduced mobility: Secondary | ICD-10-CM | POA: Diagnosis not present

## 2020-09-23 DIAGNOSIS — M6258 Muscle wasting and atrophy, not elsewhere classified, other site: Secondary | ICD-10-CM | POA: Diagnosis not present

## 2020-09-23 DIAGNOSIS — R532 Functional quadriplegia: Secondary | ICD-10-CM | POA: Diagnosis not present

## 2020-09-24 DIAGNOSIS — Z7409 Other reduced mobility: Secondary | ICD-10-CM | POA: Diagnosis not present

## 2020-09-24 DIAGNOSIS — M6258 Muscle wasting and atrophy, not elsewhere classified, other site: Secondary | ICD-10-CM | POA: Diagnosis not present

## 2020-09-24 DIAGNOSIS — R41841 Cognitive communication deficit: Secondary | ICD-10-CM | POA: Diagnosis not present

## 2020-09-24 DIAGNOSIS — R532 Functional quadriplegia: Secondary | ICD-10-CM | POA: Diagnosis not present

## 2020-09-24 DIAGNOSIS — R2681 Unsteadiness on feet: Secondary | ICD-10-CM | POA: Diagnosis not present

## 2020-09-24 DIAGNOSIS — M6281 Muscle weakness (generalized): Secondary | ICD-10-CM | POA: Diagnosis not present

## 2020-09-25 DIAGNOSIS — R41841 Cognitive communication deficit: Secondary | ICD-10-CM | POA: Diagnosis not present

## 2020-09-25 DIAGNOSIS — R532 Functional quadriplegia: Secondary | ICD-10-CM | POA: Diagnosis not present

## 2020-09-25 DIAGNOSIS — R2681 Unsteadiness on feet: Secondary | ICD-10-CM | POA: Diagnosis not present

## 2020-09-25 DIAGNOSIS — Z7409 Other reduced mobility: Secondary | ICD-10-CM | POA: Diagnosis not present

## 2020-09-25 DIAGNOSIS — M6258 Muscle wasting and atrophy, not elsewhere classified, other site: Secondary | ICD-10-CM | POA: Diagnosis not present

## 2020-09-25 DIAGNOSIS — M6281 Muscle weakness (generalized): Secondary | ICD-10-CM | POA: Diagnosis not present

## 2020-09-27 DIAGNOSIS — M6258 Muscle wasting and atrophy, not elsewhere classified, other site: Secondary | ICD-10-CM | POA: Diagnosis not present

## 2020-09-27 DIAGNOSIS — M6281 Muscle weakness (generalized): Secondary | ICD-10-CM | POA: Diagnosis not present

## 2020-09-27 DIAGNOSIS — R2681 Unsteadiness on feet: Secondary | ICD-10-CM | POA: Diagnosis not present

## 2020-09-27 DIAGNOSIS — R532 Functional quadriplegia: Secondary | ICD-10-CM | POA: Diagnosis not present

## 2020-09-27 DIAGNOSIS — Z7409 Other reduced mobility: Secondary | ICD-10-CM | POA: Diagnosis not present

## 2020-09-27 DIAGNOSIS — R41841 Cognitive communication deficit: Secondary | ICD-10-CM | POA: Diagnosis not present

## 2020-09-28 DIAGNOSIS — R532 Functional quadriplegia: Secondary | ICD-10-CM | POA: Diagnosis not present

## 2020-09-28 DIAGNOSIS — L89154 Pressure ulcer of sacral region, stage 4: Secondary | ICD-10-CM | POA: Diagnosis not present

## 2020-09-28 DIAGNOSIS — R2681 Unsteadiness on feet: Secondary | ICD-10-CM | POA: Diagnosis not present

## 2020-09-28 DIAGNOSIS — Z7409 Other reduced mobility: Secondary | ICD-10-CM | POA: Diagnosis not present

## 2020-09-28 DIAGNOSIS — M6258 Muscle wasting and atrophy, not elsewhere classified, other site: Secondary | ICD-10-CM | POA: Diagnosis not present

## 2020-09-28 DIAGNOSIS — E11622 Type 2 diabetes mellitus with other skin ulcer: Secondary | ICD-10-CM | POA: Diagnosis not present

## 2020-09-28 DIAGNOSIS — R5381 Other malaise: Secondary | ICD-10-CM | POA: Diagnosis not present

## 2020-09-28 DIAGNOSIS — M6281 Muscle weakness (generalized): Secondary | ICD-10-CM | POA: Diagnosis not present

## 2020-09-28 DIAGNOSIS — M2550 Pain in unspecified joint: Secondary | ICD-10-CM | POA: Diagnosis not present

## 2020-09-28 DIAGNOSIS — G373 Acute transverse myelitis in demyelinating disease of central nervous system: Secondary | ICD-10-CM | POA: Diagnosis not present

## 2020-09-28 DIAGNOSIS — R41841 Cognitive communication deficit: Secondary | ICD-10-CM | POA: Diagnosis not present

## 2020-09-28 DIAGNOSIS — Z7401 Bed confinement status: Secondary | ICD-10-CM | POA: Diagnosis not present

## 2020-09-28 DIAGNOSIS — L89310 Pressure ulcer of right buttock, unstageable: Secondary | ICD-10-CM | POA: Diagnosis not present

## 2020-09-29 DIAGNOSIS — M6258 Muscle wasting and atrophy, not elsewhere classified, other site: Secondary | ICD-10-CM | POA: Diagnosis not present

## 2020-09-29 DIAGNOSIS — R41841 Cognitive communication deficit: Secondary | ICD-10-CM | POA: Diagnosis not present

## 2020-09-29 DIAGNOSIS — Z7409 Other reduced mobility: Secondary | ICD-10-CM | POA: Diagnosis not present

## 2020-09-29 DIAGNOSIS — R532 Functional quadriplegia: Secondary | ICD-10-CM | POA: Diagnosis not present

## 2020-09-29 DIAGNOSIS — M6281 Muscle weakness (generalized): Secondary | ICD-10-CM | POA: Diagnosis not present

## 2020-09-29 DIAGNOSIS — R2681 Unsteadiness on feet: Secondary | ICD-10-CM | POA: Diagnosis not present

## 2020-09-30 DIAGNOSIS — M6258 Muscle wasting and atrophy, not elsewhere classified, other site: Secondary | ICD-10-CM | POA: Diagnosis not present

## 2020-09-30 DIAGNOSIS — R41841 Cognitive communication deficit: Secondary | ICD-10-CM | POA: Diagnosis not present

## 2020-09-30 DIAGNOSIS — Z7409 Other reduced mobility: Secondary | ICD-10-CM | POA: Diagnosis not present

## 2020-09-30 DIAGNOSIS — M6281 Muscle weakness (generalized): Secondary | ICD-10-CM | POA: Diagnosis not present

## 2020-09-30 DIAGNOSIS — R2681 Unsteadiness on feet: Secondary | ICD-10-CM | POA: Diagnosis not present

## 2020-09-30 DIAGNOSIS — R532 Functional quadriplegia: Secondary | ICD-10-CM | POA: Diagnosis not present

## 2020-10-01 DIAGNOSIS — M6258 Muscle wasting and atrophy, not elsewhere classified, other site: Secondary | ICD-10-CM | POA: Diagnosis not present

## 2020-10-01 DIAGNOSIS — M6281 Muscle weakness (generalized): Secondary | ICD-10-CM | POA: Diagnosis not present

## 2020-10-01 DIAGNOSIS — R2681 Unsteadiness on feet: Secondary | ICD-10-CM | POA: Diagnosis not present

## 2020-10-01 DIAGNOSIS — R532 Functional quadriplegia: Secondary | ICD-10-CM | POA: Diagnosis not present

## 2020-10-01 DIAGNOSIS — Z7409 Other reduced mobility: Secondary | ICD-10-CM | POA: Diagnosis not present

## 2020-10-01 DIAGNOSIS — R41841 Cognitive communication deficit: Secondary | ICD-10-CM | POA: Diagnosis not present

## 2020-10-02 DIAGNOSIS — R41841 Cognitive communication deficit: Secondary | ICD-10-CM | POA: Diagnosis not present

## 2020-10-02 DIAGNOSIS — Z7409 Other reduced mobility: Secondary | ICD-10-CM | POA: Diagnosis not present

## 2020-10-02 DIAGNOSIS — R2681 Unsteadiness on feet: Secondary | ICD-10-CM | POA: Diagnosis not present

## 2020-10-02 DIAGNOSIS — M6281 Muscle weakness (generalized): Secondary | ICD-10-CM | POA: Diagnosis not present

## 2020-10-02 DIAGNOSIS — R532 Functional quadriplegia: Secondary | ICD-10-CM | POA: Diagnosis not present

## 2020-10-02 DIAGNOSIS — M6258 Muscle wasting and atrophy, not elsewhere classified, other site: Secondary | ICD-10-CM | POA: Diagnosis not present

## 2020-10-04 DIAGNOSIS — M6258 Muscle wasting and atrophy, not elsewhere classified, other site: Secondary | ICD-10-CM | POA: Diagnosis not present

## 2020-10-04 DIAGNOSIS — R532 Functional quadriplegia: Secondary | ICD-10-CM | POA: Diagnosis not present

## 2020-10-04 DIAGNOSIS — R41841 Cognitive communication deficit: Secondary | ICD-10-CM | POA: Diagnosis not present

## 2020-10-04 DIAGNOSIS — Z7409 Other reduced mobility: Secondary | ICD-10-CM | POA: Diagnosis not present

## 2020-10-04 DIAGNOSIS — M6281 Muscle weakness (generalized): Secondary | ICD-10-CM | POA: Diagnosis not present

## 2020-10-04 DIAGNOSIS — R2681 Unsteadiness on feet: Secondary | ICD-10-CM | POA: Diagnosis not present

## 2020-10-06 DIAGNOSIS — M6281 Muscle weakness (generalized): Secondary | ICD-10-CM | POA: Diagnosis not present

## 2020-10-06 DIAGNOSIS — R2681 Unsteadiness on feet: Secondary | ICD-10-CM | POA: Diagnosis not present

## 2020-10-06 DIAGNOSIS — R532 Functional quadriplegia: Secondary | ICD-10-CM | POA: Diagnosis not present

## 2020-10-06 DIAGNOSIS — R41841 Cognitive communication deficit: Secondary | ICD-10-CM | POA: Diagnosis not present

## 2020-10-06 DIAGNOSIS — Z931 Gastrostomy status: Secondary | ICD-10-CM | POA: Diagnosis not present

## 2020-10-06 DIAGNOSIS — Z7409 Other reduced mobility: Secondary | ICD-10-CM | POA: Diagnosis not present

## 2020-10-06 DIAGNOSIS — M6258 Muscle wasting and atrophy, not elsewhere classified, other site: Secondary | ICD-10-CM | POA: Diagnosis not present

## 2020-10-06 DIAGNOSIS — H04129 Dry eye syndrome of unspecified lacrimal gland: Secondary | ICD-10-CM | POA: Diagnosis not present

## 2020-10-07 DIAGNOSIS — Z7409 Other reduced mobility: Secondary | ICD-10-CM | POA: Diagnosis not present

## 2020-10-07 DIAGNOSIS — R532 Functional quadriplegia: Secondary | ICD-10-CM | POA: Diagnosis not present

## 2020-10-07 DIAGNOSIS — R2681 Unsteadiness on feet: Secondary | ICD-10-CM | POA: Diagnosis not present

## 2020-10-07 DIAGNOSIS — R41841 Cognitive communication deficit: Secondary | ICD-10-CM | POA: Diagnosis not present

## 2020-10-07 DIAGNOSIS — M6281 Muscle weakness (generalized): Secondary | ICD-10-CM | POA: Diagnosis not present

## 2020-10-07 DIAGNOSIS — M6258 Muscle wasting and atrophy, not elsewhere classified, other site: Secondary | ICD-10-CM | POA: Diagnosis not present

## 2020-10-08 DIAGNOSIS — R41841 Cognitive communication deficit: Secondary | ICD-10-CM | POA: Diagnosis not present

## 2020-10-08 DIAGNOSIS — Z7409 Other reduced mobility: Secondary | ICD-10-CM | POA: Diagnosis not present

## 2020-10-08 DIAGNOSIS — M6258 Muscle wasting and atrophy, not elsewhere classified, other site: Secondary | ICD-10-CM | POA: Diagnosis not present

## 2020-10-08 DIAGNOSIS — R2681 Unsteadiness on feet: Secondary | ICD-10-CM | POA: Diagnosis not present

## 2020-10-08 DIAGNOSIS — R532 Functional quadriplegia: Secondary | ICD-10-CM | POA: Diagnosis not present

## 2020-10-08 DIAGNOSIS — M6281 Muscle weakness (generalized): Secondary | ICD-10-CM | POA: Diagnosis not present

## 2020-10-09 DIAGNOSIS — M6258 Muscle wasting and atrophy, not elsewhere classified, other site: Secondary | ICD-10-CM | POA: Diagnosis not present

## 2020-10-09 DIAGNOSIS — R41841 Cognitive communication deficit: Secondary | ICD-10-CM | POA: Diagnosis not present

## 2020-10-09 DIAGNOSIS — R532 Functional quadriplegia: Secondary | ICD-10-CM | POA: Diagnosis not present

## 2020-10-09 DIAGNOSIS — M6281 Muscle weakness (generalized): Secondary | ICD-10-CM | POA: Diagnosis not present

## 2020-10-09 DIAGNOSIS — Z7409 Other reduced mobility: Secondary | ICD-10-CM | POA: Diagnosis not present

## 2020-10-09 DIAGNOSIS — R2681 Unsteadiness on feet: Secondary | ICD-10-CM | POA: Diagnosis not present

## 2020-10-10 DIAGNOSIS — M6281 Muscle weakness (generalized): Secondary | ICD-10-CM | POA: Diagnosis not present

## 2020-10-10 DIAGNOSIS — R2681 Unsteadiness on feet: Secondary | ICD-10-CM | POA: Diagnosis not present

## 2020-10-10 DIAGNOSIS — R41841 Cognitive communication deficit: Secondary | ICD-10-CM | POA: Diagnosis not present

## 2020-10-10 DIAGNOSIS — Z7409 Other reduced mobility: Secondary | ICD-10-CM | POA: Diagnosis not present

## 2020-10-10 DIAGNOSIS — M6258 Muscle wasting and atrophy, not elsewhere classified, other site: Secondary | ICD-10-CM | POA: Diagnosis not present

## 2020-10-10 DIAGNOSIS — R532 Functional quadriplegia: Secondary | ICD-10-CM | POA: Diagnosis not present

## 2020-10-12 DIAGNOSIS — R2681 Unsteadiness on feet: Secondary | ICD-10-CM | POA: Diagnosis not present

## 2020-10-12 DIAGNOSIS — M6258 Muscle wasting and atrophy, not elsewhere classified, other site: Secondary | ICD-10-CM | POA: Diagnosis not present

## 2020-10-12 DIAGNOSIS — R41841 Cognitive communication deficit: Secondary | ICD-10-CM | POA: Diagnosis not present

## 2020-10-12 DIAGNOSIS — Z7409 Other reduced mobility: Secondary | ICD-10-CM | POA: Diagnosis not present

## 2020-10-12 DIAGNOSIS — R532 Functional quadriplegia: Secondary | ICD-10-CM | POA: Diagnosis not present

## 2020-10-12 DIAGNOSIS — M6281 Muscle weakness (generalized): Secondary | ICD-10-CM | POA: Diagnosis not present

## 2020-10-13 DIAGNOSIS — R532 Functional quadriplegia: Secondary | ICD-10-CM | POA: Diagnosis not present

## 2020-10-13 DIAGNOSIS — R2681 Unsteadiness on feet: Secondary | ICD-10-CM | POA: Diagnosis not present

## 2020-10-13 DIAGNOSIS — M6258 Muscle wasting and atrophy, not elsewhere classified, other site: Secondary | ICD-10-CM | POA: Diagnosis not present

## 2020-10-13 DIAGNOSIS — H04129 Dry eye syndrome of unspecified lacrimal gland: Secondary | ICD-10-CM | POA: Diagnosis not present

## 2020-10-13 DIAGNOSIS — R41841 Cognitive communication deficit: Secondary | ICD-10-CM | POA: Diagnosis not present

## 2020-10-13 DIAGNOSIS — M6281 Muscle weakness (generalized): Secondary | ICD-10-CM | POA: Diagnosis not present

## 2020-10-13 DIAGNOSIS — Z7409 Other reduced mobility: Secondary | ICD-10-CM | POA: Diagnosis not present

## 2020-10-13 DIAGNOSIS — H1013 Acute atopic conjunctivitis, bilateral: Secondary | ICD-10-CM | POA: Diagnosis not present

## 2020-10-14 DIAGNOSIS — L89154 Pressure ulcer of sacral region, stage 4: Secondary | ICD-10-CM | POA: Diagnosis not present

## 2020-10-14 DIAGNOSIS — R5381 Other malaise: Secondary | ICD-10-CM | POA: Diagnosis not present

## 2020-10-14 DIAGNOSIS — M6281 Muscle weakness (generalized): Secondary | ICD-10-CM | POA: Diagnosis not present

## 2020-10-14 DIAGNOSIS — R532 Functional quadriplegia: Secondary | ICD-10-CM | POA: Diagnosis not present

## 2020-10-14 DIAGNOSIS — M2550 Pain in unspecified joint: Secondary | ICD-10-CM | POA: Diagnosis not present

## 2020-10-14 DIAGNOSIS — E11622 Type 2 diabetes mellitus with other skin ulcer: Secondary | ICD-10-CM | POA: Diagnosis not present

## 2020-10-14 DIAGNOSIS — Z7401 Bed confinement status: Secondary | ICD-10-CM | POA: Diagnosis not present

## 2020-10-14 DIAGNOSIS — R531 Weakness: Secondary | ICD-10-CM | POA: Diagnosis not present

## 2020-10-14 DIAGNOSIS — M6258 Muscle wasting and atrophy, not elsewhere classified, other site: Secondary | ICD-10-CM | POA: Diagnosis not present

## 2020-10-14 DIAGNOSIS — L89314 Pressure ulcer of right buttock, stage 4: Secondary | ICD-10-CM | POA: Diagnosis not present

## 2020-10-14 DIAGNOSIS — R41841 Cognitive communication deficit: Secondary | ICD-10-CM | POA: Diagnosis not present

## 2020-10-14 DIAGNOSIS — Z7409 Other reduced mobility: Secondary | ICD-10-CM | POA: Diagnosis not present

## 2020-10-14 DIAGNOSIS — R2681 Unsteadiness on feet: Secondary | ICD-10-CM | POA: Diagnosis not present

## 2020-10-14 DIAGNOSIS — G373 Acute transverse myelitis in demyelinating disease of central nervous system: Secondary | ICD-10-CM | POA: Diagnosis not present

## 2020-10-15 DIAGNOSIS — M6258 Muscle wasting and atrophy, not elsewhere classified, other site: Secondary | ICD-10-CM | POA: Diagnosis not present

## 2020-10-15 DIAGNOSIS — R2681 Unsteadiness on feet: Secondary | ICD-10-CM | POA: Diagnosis not present

## 2020-10-15 DIAGNOSIS — R41841 Cognitive communication deficit: Secondary | ICD-10-CM | POA: Diagnosis not present

## 2020-10-15 DIAGNOSIS — Z7409 Other reduced mobility: Secondary | ICD-10-CM | POA: Diagnosis not present

## 2020-10-15 DIAGNOSIS — R532 Functional quadriplegia: Secondary | ICD-10-CM | POA: Diagnosis not present

## 2020-10-15 DIAGNOSIS — M6281 Muscle weakness (generalized): Secondary | ICD-10-CM | POA: Diagnosis not present

## 2020-10-16 DIAGNOSIS — R42 Dizziness and giddiness: Secondary | ICD-10-CM | POA: Diagnosis not present

## 2020-10-16 DIAGNOSIS — H1013 Acute atopic conjunctivitis, bilateral: Secondary | ICD-10-CM | POA: Diagnosis not present

## 2020-10-22 DIAGNOSIS — R42 Dizziness and giddiness: Secondary | ICD-10-CM | POA: Diagnosis not present

## 2020-10-22 DIAGNOSIS — G373 Acute transverse myelitis in demyelinating disease of central nervous system: Secondary | ICD-10-CM | POA: Diagnosis not present

## 2020-10-25 DIAGNOSIS — G8250 Quadriplegia, unspecified: Secondary | ICD-10-CM | POA: Diagnosis not present

## 2020-10-25 DIAGNOSIS — M6258 Muscle wasting and atrophy, not elsewhere classified, other site: Secondary | ICD-10-CM | POA: Diagnosis not present

## 2020-10-25 DIAGNOSIS — Z7409 Other reduced mobility: Secondary | ICD-10-CM | POA: Diagnosis not present

## 2020-10-25 DIAGNOSIS — R41841 Cognitive communication deficit: Secondary | ICD-10-CM | POA: Diagnosis not present

## 2020-10-25 DIAGNOSIS — R2681 Unsteadiness on feet: Secondary | ICD-10-CM | POA: Diagnosis not present

## 2020-10-25 DIAGNOSIS — R532 Functional quadriplegia: Secondary | ICD-10-CM | POA: Diagnosis not present

## 2020-10-25 DIAGNOSIS — G373 Acute transverse myelitis in demyelinating disease of central nervous system: Secondary | ICD-10-CM | POA: Diagnosis not present

## 2020-10-25 DIAGNOSIS — M6281 Muscle weakness (generalized): Secondary | ICD-10-CM | POA: Diagnosis not present

## 2020-10-25 DIAGNOSIS — R278 Other lack of coordination: Secondary | ICD-10-CM | POA: Diagnosis not present

## 2020-10-25 DIAGNOSIS — R1312 Dysphagia, oropharyngeal phase: Secondary | ICD-10-CM | POA: Diagnosis not present

## 2020-10-26 DIAGNOSIS — M6258 Muscle wasting and atrophy, not elsewhere classified, other site: Secondary | ICD-10-CM | POA: Diagnosis not present

## 2020-10-26 DIAGNOSIS — R532 Functional quadriplegia: Secondary | ICD-10-CM | POA: Diagnosis not present

## 2020-10-26 DIAGNOSIS — M6281 Muscle weakness (generalized): Secondary | ICD-10-CM | POA: Diagnosis not present

## 2020-10-26 DIAGNOSIS — R2681 Unsteadiness on feet: Secondary | ICD-10-CM | POA: Diagnosis not present

## 2020-10-26 DIAGNOSIS — R41841 Cognitive communication deficit: Secondary | ICD-10-CM | POA: Diagnosis not present

## 2020-10-26 DIAGNOSIS — Z7409 Other reduced mobility: Secondary | ICD-10-CM | POA: Diagnosis not present

## 2020-10-27 DIAGNOSIS — Z7409 Other reduced mobility: Secondary | ICD-10-CM | POA: Diagnosis not present

## 2020-10-27 DIAGNOSIS — R532 Functional quadriplegia: Secondary | ICD-10-CM | POA: Diagnosis not present

## 2020-10-27 DIAGNOSIS — R41841 Cognitive communication deficit: Secondary | ICD-10-CM | POA: Diagnosis not present

## 2020-10-27 DIAGNOSIS — R2681 Unsteadiness on feet: Secondary | ICD-10-CM | POA: Diagnosis not present

## 2020-10-27 DIAGNOSIS — M6258 Muscle wasting and atrophy, not elsewhere classified, other site: Secondary | ICD-10-CM | POA: Diagnosis not present

## 2020-10-27 DIAGNOSIS — M6281 Muscle weakness (generalized): Secondary | ICD-10-CM | POA: Diagnosis not present

## 2020-10-28 ENCOUNTER — Telehealth: Payer: Medicare Other

## 2020-10-28 DIAGNOSIS — R41841 Cognitive communication deficit: Secondary | ICD-10-CM | POA: Diagnosis not present

## 2020-10-28 DIAGNOSIS — M6258 Muscle wasting and atrophy, not elsewhere classified, other site: Secondary | ICD-10-CM | POA: Diagnosis not present

## 2020-10-28 DIAGNOSIS — M6281 Muscle weakness (generalized): Secondary | ICD-10-CM | POA: Diagnosis not present

## 2020-10-28 DIAGNOSIS — Z7409 Other reduced mobility: Secondary | ICD-10-CM | POA: Diagnosis not present

## 2020-10-28 DIAGNOSIS — R2681 Unsteadiness on feet: Secondary | ICD-10-CM | POA: Diagnosis not present

## 2020-10-28 DIAGNOSIS — R532 Functional quadriplegia: Secondary | ICD-10-CM | POA: Diagnosis not present

## 2020-10-29 ENCOUNTER — Telehealth: Payer: Medicare Other

## 2020-10-29 DIAGNOSIS — Z7409 Other reduced mobility: Secondary | ICD-10-CM | POA: Diagnosis not present

## 2020-10-29 DIAGNOSIS — M6281 Muscle weakness (generalized): Secondary | ICD-10-CM | POA: Diagnosis not present

## 2020-10-29 DIAGNOSIS — R2681 Unsteadiness on feet: Secondary | ICD-10-CM | POA: Diagnosis not present

## 2020-10-29 DIAGNOSIS — M6258 Muscle wasting and atrophy, not elsewhere classified, other site: Secondary | ICD-10-CM | POA: Diagnosis not present

## 2020-10-29 DIAGNOSIS — R41841 Cognitive communication deficit: Secondary | ICD-10-CM | POA: Diagnosis not present

## 2020-10-29 DIAGNOSIS — R532 Functional quadriplegia: Secondary | ICD-10-CM | POA: Diagnosis not present

## 2020-10-30 DIAGNOSIS — M6258 Muscle wasting and atrophy, not elsewhere classified, other site: Secondary | ICD-10-CM | POA: Diagnosis not present

## 2020-10-30 DIAGNOSIS — R532 Functional quadriplegia: Secondary | ICD-10-CM | POA: Diagnosis not present

## 2020-10-30 DIAGNOSIS — M6281 Muscle weakness (generalized): Secondary | ICD-10-CM | POA: Diagnosis not present

## 2020-10-30 DIAGNOSIS — Z7409 Other reduced mobility: Secondary | ICD-10-CM | POA: Diagnosis not present

## 2020-10-30 DIAGNOSIS — R2681 Unsteadiness on feet: Secondary | ICD-10-CM | POA: Diagnosis not present

## 2020-10-30 DIAGNOSIS — R41841 Cognitive communication deficit: Secondary | ICD-10-CM | POA: Diagnosis not present

## 2020-11-03 DIAGNOSIS — Z794 Long term (current) use of insulin: Secondary | ICD-10-CM | POA: Diagnosis not present

## 2020-11-03 DIAGNOSIS — K592 Neurogenic bowel, not elsewhere classified: Secondary | ICD-10-CM | POA: Diagnosis not present

## 2020-11-03 DIAGNOSIS — G8250 Quadriplegia, unspecified: Secondary | ICD-10-CM | POA: Diagnosis not present

## 2020-11-03 DIAGNOSIS — G373 Acute transverse myelitis in demyelinating disease of central nervous system: Secondary | ICD-10-CM | POA: Diagnosis not present

## 2020-11-04 DIAGNOSIS — M6281 Muscle weakness (generalized): Secondary | ICD-10-CM | POA: Diagnosis not present

## 2020-11-04 DIAGNOSIS — R532 Functional quadriplegia: Secondary | ICD-10-CM | POA: Diagnosis not present

## 2020-11-04 DIAGNOSIS — M6258 Muscle wasting and atrophy, not elsewhere classified, other site: Secondary | ICD-10-CM | POA: Diagnosis not present

## 2020-11-04 DIAGNOSIS — R2681 Unsteadiness on feet: Secondary | ICD-10-CM | POA: Diagnosis not present

## 2020-11-04 DIAGNOSIS — R41841 Cognitive communication deficit: Secondary | ICD-10-CM | POA: Diagnosis not present

## 2020-11-04 DIAGNOSIS — Z7409 Other reduced mobility: Secondary | ICD-10-CM | POA: Diagnosis not present

## 2020-11-05 DIAGNOSIS — M6281 Muscle weakness (generalized): Secondary | ICD-10-CM | POA: Diagnosis not present

## 2020-11-05 DIAGNOSIS — R41841 Cognitive communication deficit: Secondary | ICD-10-CM | POA: Diagnosis not present

## 2020-11-05 DIAGNOSIS — R532 Functional quadriplegia: Secondary | ICD-10-CM | POA: Diagnosis not present

## 2020-11-05 DIAGNOSIS — M6258 Muscle wasting and atrophy, not elsewhere classified, other site: Secondary | ICD-10-CM | POA: Diagnosis not present

## 2020-11-05 DIAGNOSIS — R2681 Unsteadiness on feet: Secondary | ICD-10-CM | POA: Diagnosis not present

## 2020-11-05 DIAGNOSIS — Z7409 Other reduced mobility: Secondary | ICD-10-CM | POA: Diagnosis not present

## 2020-11-06 DIAGNOSIS — R2681 Unsteadiness on feet: Secondary | ICD-10-CM | POA: Diagnosis not present

## 2020-11-06 DIAGNOSIS — M6281 Muscle weakness (generalized): Secondary | ICD-10-CM | POA: Diagnosis not present

## 2020-11-06 DIAGNOSIS — N481 Balanitis: Secondary | ICD-10-CM | POA: Diagnosis not present

## 2020-11-06 DIAGNOSIS — M6258 Muscle wasting and atrophy, not elsewhere classified, other site: Secondary | ICD-10-CM | POA: Diagnosis not present

## 2020-11-06 DIAGNOSIS — Z7409 Other reduced mobility: Secondary | ICD-10-CM | POA: Diagnosis not present

## 2020-11-06 DIAGNOSIS — R532 Functional quadriplegia: Secondary | ICD-10-CM | POA: Diagnosis not present

## 2020-11-06 DIAGNOSIS — Z978 Presence of other specified devices: Secondary | ICD-10-CM | POA: Diagnosis not present

## 2020-11-06 DIAGNOSIS — R41841 Cognitive communication deficit: Secondary | ICD-10-CM | POA: Diagnosis not present

## 2020-11-07 DIAGNOSIS — D649 Anemia, unspecified: Secondary | ICD-10-CM | POA: Diagnosis not present

## 2020-11-09 DIAGNOSIS — G4700 Insomnia, unspecified: Secondary | ICD-10-CM | POA: Diagnosis not present

## 2020-11-09 DIAGNOSIS — F411 Generalized anxiety disorder: Secondary | ICD-10-CM | POA: Diagnosis not present

## 2020-11-09 DIAGNOSIS — F331 Major depressive disorder, recurrent, moderate: Secondary | ICD-10-CM | POA: Diagnosis not present

## 2020-11-11 DIAGNOSIS — R41841 Cognitive communication deficit: Secondary | ICD-10-CM | POA: Diagnosis not present

## 2020-11-11 DIAGNOSIS — M6258 Muscle wasting and atrophy, not elsewhere classified, other site: Secondary | ICD-10-CM | POA: Diagnosis not present

## 2020-11-11 DIAGNOSIS — R5381 Other malaise: Secondary | ICD-10-CM | POA: Diagnosis not present

## 2020-11-11 DIAGNOSIS — G373 Acute transverse myelitis in demyelinating disease of central nervous system: Secondary | ICD-10-CM | POA: Diagnosis not present

## 2020-11-11 DIAGNOSIS — Z7401 Bed confinement status: Secondary | ICD-10-CM | POA: Diagnosis not present

## 2020-11-11 DIAGNOSIS — L89154 Pressure ulcer of sacral region, stage 4: Secondary | ICD-10-CM | POA: Diagnosis not present

## 2020-11-11 DIAGNOSIS — M2550 Pain in unspecified joint: Secondary | ICD-10-CM | POA: Diagnosis not present

## 2020-11-11 DIAGNOSIS — Z7409 Other reduced mobility: Secondary | ICD-10-CM | POA: Diagnosis not present

## 2020-11-11 DIAGNOSIS — R532 Functional quadriplegia: Secondary | ICD-10-CM | POA: Diagnosis not present

## 2020-11-11 DIAGNOSIS — L89314 Pressure ulcer of right buttock, stage 4: Secondary | ICD-10-CM | POA: Diagnosis not present

## 2020-11-11 DIAGNOSIS — M6281 Muscle weakness (generalized): Secondary | ICD-10-CM | POA: Diagnosis not present

## 2020-11-11 DIAGNOSIS — R2681 Unsteadiness on feet: Secondary | ICD-10-CM | POA: Diagnosis not present

## 2020-11-11 DIAGNOSIS — E11622 Type 2 diabetes mellitus with other skin ulcer: Secondary | ICD-10-CM | POA: Diagnosis not present

## 2020-11-12 DIAGNOSIS — M6281 Muscle weakness (generalized): Secondary | ICD-10-CM | POA: Diagnosis not present

## 2020-11-12 DIAGNOSIS — R41841 Cognitive communication deficit: Secondary | ICD-10-CM | POA: Diagnosis not present

## 2020-11-12 DIAGNOSIS — Z7409 Other reduced mobility: Secondary | ICD-10-CM | POA: Diagnosis not present

## 2020-11-12 DIAGNOSIS — M6258 Muscle wasting and atrophy, not elsewhere classified, other site: Secondary | ICD-10-CM | POA: Diagnosis not present

## 2020-11-12 DIAGNOSIS — R532 Functional quadriplegia: Secondary | ICD-10-CM | POA: Diagnosis not present

## 2020-11-12 DIAGNOSIS — R2681 Unsteadiness on feet: Secondary | ICD-10-CM | POA: Diagnosis not present

## 2020-11-12 DIAGNOSIS — D649 Anemia, unspecified: Secondary | ICD-10-CM | POA: Diagnosis not present

## 2020-11-16 ENCOUNTER — Inpatient Hospital Stay (HOSPITAL_COMMUNITY): Payer: Medicare Other

## 2020-11-16 ENCOUNTER — Other Ambulatory Visit: Payer: Self-pay

## 2020-11-16 ENCOUNTER — Inpatient Hospital Stay (HOSPITAL_COMMUNITY)
Admission: EM | Admit: 2020-11-16 | Discharge: 2020-11-22 | DRG: 871 | Disposition: A | Discharge status: Skilled Nursing Facility | Payer: Medicare Other | Source: Skilled Nursing Facility | Attending: Internal Medicine | Admitting: Internal Medicine

## 2020-11-16 ENCOUNTER — Encounter (HOSPITAL_COMMUNITY): Payer: Self-pay

## 2020-11-16 ENCOUNTER — Emergency Department (HOSPITAL_COMMUNITY): Payer: Medicare Other

## 2020-11-16 DIAGNOSIS — K592 Neurogenic bowel, not elsewhere classified: Secondary | ICD-10-CM | POA: Diagnosis not present

## 2020-11-16 DIAGNOSIS — Z794 Long term (current) use of insulin: Secondary | ICD-10-CM

## 2020-11-16 DIAGNOSIS — F32A Depression, unspecified: Secondary | ICD-10-CM | POA: Diagnosis present

## 2020-11-16 DIAGNOSIS — M2550 Pain in unspecified joint: Secondary | ICD-10-CM | POA: Diagnosis not present

## 2020-11-16 DIAGNOSIS — K219 Gastro-esophageal reflux disease without esophagitis: Secondary | ICD-10-CM | POA: Diagnosis present

## 2020-11-16 DIAGNOSIS — Z881 Allergy status to other antibiotic agents status: Secondary | ICD-10-CM

## 2020-11-16 DIAGNOSIS — Z87891 Personal history of nicotine dependence: Secondary | ICD-10-CM

## 2020-11-16 DIAGNOSIS — N319 Neuromuscular dysfunction of bladder, unspecified: Secondary | ICD-10-CM | POA: Diagnosis not present

## 2020-11-16 DIAGNOSIS — R110 Nausea: Secondary | ICD-10-CM | POA: Diagnosis not present

## 2020-11-16 DIAGNOSIS — D509 Iron deficiency anemia, unspecified: Secondary | ICD-10-CM | POA: Diagnosis present

## 2020-11-16 DIAGNOSIS — R609 Edema, unspecified: Secondary | ICD-10-CM | POA: Diagnosis present

## 2020-11-16 DIAGNOSIS — M6259 Muscle wasting and atrophy, not elsewhere classified, multiple sites: Secondary | ICD-10-CM | POA: Diagnosis not present

## 2020-11-16 DIAGNOSIS — M4640 Discitis, unspecified, site unspecified: Secondary | ICD-10-CM | POA: Diagnosis present

## 2020-11-16 DIAGNOSIS — I959 Hypotension, unspecified: Secondary | ICD-10-CM | POA: Diagnosis present

## 2020-11-16 DIAGNOSIS — A419 Sepsis, unspecified organism: Principal | ICD-10-CM | POA: Diagnosis present

## 2020-11-16 DIAGNOSIS — N2889 Other specified disorders of kidney and ureter: Secondary | ICD-10-CM | POA: Diagnosis not present

## 2020-11-16 DIAGNOSIS — M4628 Osteomyelitis of vertebra, sacral and sacrococcygeal region: Secondary | ICD-10-CM | POA: Diagnosis present

## 2020-11-16 DIAGNOSIS — E785 Hyperlipidemia, unspecified: Secondary | ICD-10-CM | POA: Diagnosis present

## 2020-11-16 DIAGNOSIS — R319 Hematuria, unspecified: Secondary | ICD-10-CM

## 2020-11-16 DIAGNOSIS — Z20822 Contact with and (suspected) exposure to covid-19: Secondary | ICD-10-CM | POA: Diagnosis present

## 2020-11-16 DIAGNOSIS — R531 Weakness: Secondary | ICD-10-CM | POA: Diagnosis not present

## 2020-11-16 DIAGNOSIS — Z880 Allergy status to penicillin: Secondary | ICD-10-CM

## 2020-11-16 DIAGNOSIS — Z8249 Family history of ischemic heart disease and other diseases of the circulatory system: Secondary | ICD-10-CM

## 2020-11-16 DIAGNOSIS — E669 Obesity, unspecified: Secondary | ICD-10-CM | POA: Diagnosis present

## 2020-11-16 DIAGNOSIS — R2681 Unsteadiness on feet: Secondary | ICD-10-CM | POA: Diagnosis not present

## 2020-11-16 DIAGNOSIS — Z931 Gastrostomy status: Secondary | ICD-10-CM

## 2020-11-16 DIAGNOSIS — I10 Essential (primary) hypertension: Secondary | ICD-10-CM | POA: Diagnosis not present

## 2020-11-16 DIAGNOSIS — R1310 Dysphagia, unspecified: Secondary | ICD-10-CM | POA: Diagnosis present

## 2020-11-16 DIAGNOSIS — Z7401 Bed confinement status: Secondary | ICD-10-CM | POA: Diagnosis not present

## 2020-11-16 DIAGNOSIS — K5730 Diverticulosis of large intestine without perforation or abscess without bleeding: Secondary | ICD-10-CM | POA: Diagnosis not present

## 2020-11-16 DIAGNOSIS — G8250 Quadriplegia, unspecified: Secondary | ICD-10-CM | POA: Diagnosis not present

## 2020-11-16 DIAGNOSIS — G360 Neuromyelitis optica [Devic]: Secondary | ICD-10-CM | POA: Diagnosis not present

## 2020-11-16 DIAGNOSIS — Z7409 Other reduced mobility: Secondary | ICD-10-CM | POA: Diagnosis not present

## 2020-11-16 DIAGNOSIS — E119 Type 2 diabetes mellitus without complications: Secondary | ICD-10-CM | POA: Diagnosis present

## 2020-11-16 DIAGNOSIS — E876 Hypokalemia: Secondary | ICD-10-CM | POA: Diagnosis not present

## 2020-11-16 DIAGNOSIS — E1169 Type 2 diabetes mellitus with other specified complication: Secondary | ICD-10-CM | POA: Diagnosis present

## 2020-11-16 DIAGNOSIS — Z66 Do not resuscitate: Secondary | ICD-10-CM | POA: Diagnosis present

## 2020-11-16 DIAGNOSIS — L89154 Pressure ulcer of sacral region, stage 4: Secondary | ICD-10-CM | POA: Diagnosis present

## 2020-11-16 DIAGNOSIS — L89512 Pressure ulcer of right ankle, stage 2: Secondary | ICD-10-CM | POA: Diagnosis present

## 2020-11-16 DIAGNOSIS — F419 Anxiety disorder, unspecified: Secondary | ICD-10-CM | POA: Diagnosis present

## 2020-11-16 DIAGNOSIS — L89319 Pressure ulcer of right buttock, unspecified stage: Secondary | ICD-10-CM | POA: Diagnosis not present

## 2020-11-16 DIAGNOSIS — G373 Acute transverse myelitis in demyelinating disease of central nervous system: Secondary | ICD-10-CM | POA: Diagnosis present

## 2020-11-16 DIAGNOSIS — N390 Urinary tract infection, site not specified: Secondary | ICD-10-CM | POA: Diagnosis not present

## 2020-11-16 DIAGNOSIS — Z6832 Body mass index (BMI) 32.0-32.9, adult: Secondary | ICD-10-CM

## 2020-11-16 DIAGNOSIS — Z85820 Personal history of malignant melanoma of skin: Secondary | ICD-10-CM

## 2020-11-16 DIAGNOSIS — R1031 Right lower quadrant pain: Secondary | ICD-10-CM | POA: Diagnosis not present

## 2020-11-16 DIAGNOSIS — R42 Dizziness and giddiness: Secondary | ICD-10-CM | POA: Diagnosis present

## 2020-11-16 DIAGNOSIS — R6520 Severe sepsis without septic shock: Secondary | ICD-10-CM | POA: Diagnosis not present

## 2020-11-16 DIAGNOSIS — N3091 Cystitis, unspecified with hematuria: Secondary | ICD-10-CM

## 2020-11-16 DIAGNOSIS — L0231 Cutaneous abscess of buttock: Secondary | ICD-10-CM | POA: Diagnosis present

## 2020-11-16 DIAGNOSIS — G4733 Obstructive sleep apnea (adult) (pediatric): Secondary | ICD-10-CM | POA: Diagnosis present

## 2020-11-16 DIAGNOSIS — M6281 Muscle weakness (generalized): Secondary | ICD-10-CM | POA: Diagnosis not present

## 2020-11-16 DIAGNOSIS — R509 Fever, unspecified: Secondary | ICD-10-CM | POA: Diagnosis not present

## 2020-11-16 DIAGNOSIS — L89521 Pressure ulcer of left ankle, stage 1: Secondary | ICD-10-CM | POA: Diagnosis present

## 2020-11-16 DIAGNOSIS — M2578 Osteophyte, vertebrae: Secondary | ICD-10-CM | POA: Diagnosis not present

## 2020-11-16 DIAGNOSIS — Z888 Allergy status to other drugs, medicaments and biological substances status: Secondary | ICD-10-CM

## 2020-11-16 DIAGNOSIS — Z9049 Acquired absence of other specified parts of digestive tract: Secondary | ICD-10-CM

## 2020-11-16 DIAGNOSIS — R41841 Cognitive communication deficit: Secondary | ICD-10-CM | POA: Diagnosis not present

## 2020-11-16 DIAGNOSIS — L8995 Pressure ulcer of unspecified site, unstageable: Secondary | ICD-10-CM | POA: Diagnosis not present

## 2020-11-16 DIAGNOSIS — Z79899 Other long term (current) drug therapy: Secondary | ICD-10-CM

## 2020-11-16 DIAGNOSIS — Z955 Presence of coronary angioplasty implant and graft: Secondary | ICD-10-CM

## 2020-11-16 DIAGNOSIS — I2510 Atherosclerotic heart disease of native coronary artery without angina pectoris: Secondary | ICD-10-CM | POA: Diagnosis present

## 2020-11-16 DIAGNOSIS — E869 Volume depletion, unspecified: Secondary | ICD-10-CM | POA: Diagnosis not present

## 2020-11-16 DIAGNOSIS — R109 Unspecified abdominal pain: Secondary | ICD-10-CM | POA: Diagnosis not present

## 2020-11-16 DIAGNOSIS — E1165 Type 2 diabetes mellitus with hyperglycemia: Secondary | ICD-10-CM

## 2020-11-16 DIAGNOSIS — R1312 Dysphagia, oropharyngeal phase: Secondary | ICD-10-CM | POA: Diagnosis not present

## 2020-11-16 DIAGNOSIS — M869 Osteomyelitis, unspecified: Secondary | ICD-10-CM | POA: Diagnosis not present

## 2020-11-16 DIAGNOSIS — L8990 Pressure ulcer of unspecified site, unspecified stage: Secondary | ICD-10-CM | POA: Insufficient documentation

## 2020-11-16 DIAGNOSIS — D72829 Elevated white blood cell count, unspecified: Secondary | ICD-10-CM | POA: Diagnosis not present

## 2020-11-16 DIAGNOSIS — Z7982 Long term (current) use of aspirin: Secondary | ICD-10-CM

## 2020-11-16 DIAGNOSIS — G8220 Paraplegia, unspecified: Secondary | ICD-10-CM | POA: Diagnosis present

## 2020-11-16 DIAGNOSIS — Y846 Urinary catheterization as the cause of abnormal reaction of the patient, or of later complication, without mention of misadventure at the time of the procedure: Secondary | ICD-10-CM | POA: Diagnosis present

## 2020-11-16 DIAGNOSIS — C439 Malignant melanoma of skin, unspecified: Secondary | ICD-10-CM | POA: Diagnosis present

## 2020-11-16 DIAGNOSIS — I252 Old myocardial infarction: Secondary | ICD-10-CM

## 2020-11-16 DIAGNOSIS — R52 Pain, unspecified: Secondary | ICD-10-CM

## 2020-11-16 DIAGNOSIS — B964 Proteus (mirabilis) (morganii) as the cause of diseases classified elsewhere: Secondary | ICD-10-CM | POA: Diagnosis present

## 2020-11-16 DIAGNOSIS — Z933 Colostomy status: Secondary | ICD-10-CM

## 2020-11-16 DIAGNOSIS — L89622 Pressure ulcer of left heel, stage 2: Secondary | ICD-10-CM | POA: Diagnosis present

## 2020-11-16 DIAGNOSIS — R278 Other lack of coordination: Secondary | ICD-10-CM | POA: Diagnosis not present

## 2020-11-16 DIAGNOSIS — L98419 Non-pressure chronic ulcer of buttock with unspecified severity: Secondary | ICD-10-CM | POA: Diagnosis not present

## 2020-11-16 LAB — COMPREHENSIVE METABOLIC PANEL
ALT: 15 U/L (ref 0–44)
AST: 25 U/L (ref 15–41)
Albumin: 2.6 g/dL — ABNORMAL LOW (ref 3.5–5.0)
Alkaline Phosphatase: 88 U/L (ref 38–126)
Anion gap: 10 (ref 5–15)
BUN: 38 mg/dL — ABNORMAL HIGH (ref 8–23)
CO2: 23 mmol/L (ref 22–32)
Calcium: 8.9 mg/dL (ref 8.9–10.3)
Chloride: 104 mmol/L (ref 98–111)
Creatinine, Ser: 0.91 mg/dL (ref 0.61–1.24)
GFR, Estimated: >60 mL/min (ref >60)
Glucose, Bld: 219 mg/dL — ABNORMAL HIGH (ref 70–99)
Potassium: 4.7 mmol/L (ref 3.5–5.1)
Sodium: 137 mmol/L (ref 135–145)
Total Bilirubin: 0.5 mg/dL (ref 0.3–1.2)
Total Protein: 6 g/dL — ABNORMAL LOW (ref 6.5–8.1)

## 2020-11-16 LAB — CBC WITH DIFFERENTIAL/PLATELET
Abs Immature Granulocytes: 1.22 10*3/uL — ABNORMAL HIGH (ref 0.00–0.07)
Basophils Absolute: 0 10*3/uL (ref 0.0–0.1)
Basophils Relative: 0 %
Eosinophils Absolute: 0 10*3/uL (ref 0.0–0.5)
Eosinophils Relative: 0 %
HCT: 34.6 % — ABNORMAL LOW (ref 39.0–52.0)
Hemoglobin: 10.4 g/dL — ABNORMAL LOW (ref 13.0–17.0)
Immature Granulocytes: 6 %
Lymphocytes Relative: 5 %
Lymphs Abs: 0.9 10*3/uL (ref 0.7–4.0)
MCH: 23.9 pg — ABNORMAL LOW (ref 26.0–34.0)
MCHC: 30.1 g/dL (ref 30.0–36.0)
MCV: 79.4 fL — ABNORMAL LOW (ref 80.0–100.0)
Monocytes Absolute: 1.4 10*3/uL — ABNORMAL HIGH (ref 0.1–1.0)
Monocytes Relative: 7 %
Neutro Abs: 15.6 10*3/uL — ABNORMAL HIGH (ref 1.7–7.7)
Neutrophils Relative %: 82 %
Platelets: 348 10*3/uL (ref 150–400)
RBC: 4.36 MIL/uL (ref 4.22–5.81)
RDW: 18.4 % — ABNORMAL HIGH (ref 11.5–15.5)
WBC: 19.2 10*3/uL — ABNORMAL HIGH (ref 4.0–10.5)
nRBC: 0 % (ref 0.0–0.2)

## 2020-11-16 LAB — URINALYSIS, ROUTINE W REFLEX MICROSCOPIC
RBC / HPF: >50 RBC/hpf — ABNORMAL HIGH (ref 0–5)
WBC, UA: >50 WBC/hpf — ABNORMAL HIGH (ref 0–5)

## 2020-11-16 LAB — RESP PANEL BY RT-PCR (FLU A&B, COVID) ARPGX2
Influenza A by PCR: NEGATIVE
Influenza B by PCR: NEGATIVE
SARS Coronavirus 2 by RT PCR: NEGATIVE

## 2020-11-16 LAB — MAGNESIUM: Magnesium: 1.1 mg/dL — ABNORMAL LOW (ref 1.7–2.4)

## 2020-11-16 LAB — LACTIC ACID, PLASMA
Lactic Acid, Venous: 3.5 mmol/L (ref 0.5–1.9)
Lactic Acid, Venous: 3.1 mmol/L (ref 0.5–1.9)

## 2020-11-16 LAB — GLUCOSE, CAPILLARY
Glucose-Capillary: 109 mg/dL — ABNORMAL HIGH (ref 70–99)
Glucose-Capillary: 164 mg/dL — ABNORMAL HIGH (ref 70–99)

## 2020-11-16 LAB — HEMOGLOBIN A1C
Hgb A1c MFr Bld: 8.2 % — ABNORMAL HIGH (ref 4.8–5.6)
Mean Plasma Glucose: 188.64 mg/dL

## 2020-11-16 LAB — PROTIME-INR
INR: 1.2 (ref 0.8–1.2)
Prothrombin Time: 14.4 seconds (ref 11.4–15.2)

## 2020-11-16 LAB — PHOSPHORUS: Phosphorus: 3.6 mg/dL (ref 2.5–4.6)

## 2020-11-16 LAB — APTT: aPTT: 37 seconds — ABNORMAL HIGH (ref 24–36)

## 2020-11-16 MED ORDER — METRONIDAZOLE IN NACL 5-0.79 MG/ML-% IV SOLN
500.0000 mg | Freq: Once | INTRAVENOUS | Status: AC
  Administered 2020-11-16: 500 mg via INTRAVENOUS
  Filled 2020-11-16: qty 100

## 2020-11-16 MED ORDER — LACTATED RINGERS IV BOLUS (SEPSIS): 1000.0000 mL | Freq: Once | INTRAVENOUS | Status: DC

## 2020-11-16 MED ORDER — LACTATED RINGERS IV BOLUS (SEPSIS)
1000.0000 mL | Freq: Once | INTRAVENOUS | Status: AC
  Administered 2020-11-16: 1000 mL via INTRAVENOUS

## 2020-11-16 MED ORDER — LACTATED RINGERS IV BOLUS (SEPSIS)
500.0000 mL | Freq: Once | INTRAVENOUS | Status: AC
  Administered 2020-11-16: 500 mL via INTRAVENOUS

## 2020-11-16 MED ORDER — SODIUM CHLORIDE 0.9 % IV SOLN
INTRAVENOUS | Status: AC
  Filled 2020-11-16: qty 250

## 2020-11-16 MED ORDER — SODIUM CHLORIDE 0.9 % IV SOLN
2.0000 g | Freq: Once | INTRAVENOUS | Status: AC
  Administered 2020-11-16: 2 g via INTRAVENOUS
  Filled 2020-11-16: qty 2

## 2020-11-16 MED ORDER — INSULIN ASPART 100 UNIT/ML ~~LOC~~ SOLN
0.0000 [IU] | Freq: Three times a day (TID) | SUBCUTANEOUS | Status: DC
  Administered 2020-11-17 (×2): 3 [IU] via SUBCUTANEOUS
  Administered 2020-11-17: 2 [IU] via SUBCUTANEOUS
  Administered 2020-11-18: 3 [IU] via SUBCUTANEOUS
  Administered 2020-11-18: 2 [IU] via SUBCUTANEOUS
  Administered 2020-11-18: 3 [IU] via SUBCUTANEOUS
  Administered 2020-11-19: 5 [IU] via SUBCUTANEOUS
  Administered 2020-11-19: 8 [IU] via SUBCUTANEOUS
  Administered 2020-11-19 – 2020-11-20 (×3): 3 [IU] via SUBCUTANEOUS
  Administered 2020-11-20: 8 [IU] via SUBCUTANEOUS
  Administered 2020-11-21 (×2): 3 [IU] via SUBCUTANEOUS
  Administered 2020-11-21: 5 [IU] via SUBCUTANEOUS
  Administered 2020-11-22: 2 [IU] via SUBCUTANEOUS
  Administered 2020-11-22: 3 [IU] via SUBCUTANEOUS
  Filled 2020-11-16: qty 0.15

## 2020-11-16 MED ORDER — VANCOMYCIN HCL 2000 MG/400ML IV SOLN
2000.0000 mg | Freq: Once | INTRAVENOUS | Status: AC
  Administered 2020-11-16: 2000 mg via INTRAVENOUS
  Filled 2020-11-16: qty 400

## 2020-11-16 MED ORDER — INSULIN GLARGINE 100 UNIT/ML ~~LOC~~ SOLN
15.0000 [IU] | Freq: Every day | SUBCUTANEOUS | Status: DC
  Administered 2020-11-16 – 2020-11-21 (×6): 15 [IU] via SUBCUTANEOUS
  Filled 2020-11-16 (×7): qty 0.15

## 2020-11-16 MED ORDER — SODIUM CHLORIDE 0.9 % IV SOLN: 2.0000 g | Freq: Once | INTRAVENOUS | Status: DC

## 2020-11-16 MED ORDER — LACTULOSE 10 GM/15ML PO SOLN: 15.0000 g | ORAL | Status: DC | PRN

## 2020-11-16 MED ORDER — ALBUTEROL SULFATE (2.5 MG/3ML) 0.083% IN NEBU: 2.5000 mg | INHALATION_SOLUTION | Freq: Once | RESPIRATORY_TRACT | Status: DC | PRN

## 2020-11-16 MED ORDER — LACTATED RINGERS IV SOLN: INTRAVENOUS | Status: DC

## 2020-11-16 MED ORDER — CHOLESTYRAMINE 4 G PO PACK: 4.0000 g | PACK | Freq: Every day | ORAL | Status: DC

## 2020-11-16 MED ORDER — SODIUM CHLORIDE 0.9 % IV SOLN
2.0000 g | Freq: Three times a day (TID) | INTRAVENOUS | Status: DC
  Administered 2020-11-16 – 2020-11-19 (×8): 2 g via INTRAVENOUS
  Filled 2020-11-16 (×9): qty 2

## 2020-11-16 MED ORDER — VANCOMYCIN HCL IN DEXTROSE 1-5 GM/200ML-% IV SOLN: 1000.0000 mg | Freq: Once | INTRAVENOUS | Status: DC

## 2020-11-16 MED ORDER — CHOLESTYRAMINE LIGHT 4 G PO PACK
4.0000 g | PACK | ORAL | Status: DC
  Administered 2020-11-17 – 2020-11-22 (×6): 4 g via ORAL
  Filled 2020-11-16 (×6): qty 1

## 2020-11-16 MED ORDER — ATORVASTATIN CALCIUM 10 MG PO TABS
20.0000 mg | ORAL_TABLET | Freq: Every day | ORAL | Status: DC
  Administered 2020-11-16 – 2020-11-21 (×6): 20 mg via ORAL
  Filled 2020-11-16 (×3): qty 1
  Filled 2020-11-16: qty 2
  Filled 2020-11-16: qty 1
  Filled 2020-11-16: qty 2

## 2020-11-16 MED ORDER — IOHEXOL 300 MG/ML  SOLN
100.0000 mL | Freq: Once | INTRAMUSCULAR | Status: AC | PRN
  Administered 2020-11-16: 100 mL via INTRAVENOUS

## 2020-11-16 MED ORDER — SERTRALINE HCL 50 MG PO TABS
75.0000 mg | ORAL_TABLET | Freq: Every day | ORAL | Status: DC
  Administered 2020-11-16 – 2020-11-21 (×6): 75 mg via ORAL
  Filled 2020-11-16 (×6): qty 1

## 2020-11-16 MED ORDER — SERTRALINE HCL 50 MG PO TABS: 50.0000 mg | ORAL_TABLET | Freq: Every day | ORAL | Status: DC

## 2020-11-16 MED ORDER — HEPARIN SODIUM (PORCINE) 5000 UNIT/ML IJ SOLN: 5000.0000 [IU] | Freq: Three times a day (TID) | INTRAMUSCULAR | Status: DC

## 2020-11-16 MED ORDER — METRONIDAZOLE IN NACL 5-0.79 MG/ML-% IV SOLN
500.0000 mg | Freq: Three times a day (TID) | INTRAVENOUS | Status: DC
  Administered 2020-11-16 – 2020-11-18 (×5): 500 mg via INTRAVENOUS
  Filled 2020-11-16 (×5): qty 100

## 2020-11-16 MED ORDER — TRAMADOL HCL 50 MG PO TABS
50.0000 mg | ORAL_TABLET | Freq: Two times a day (BID) | ORAL | Status: DC | PRN
  Administered 2020-11-17 – 2020-11-21 (×4): 50 mg via ORAL
  Filled 2020-11-16 (×5): qty 1

## 2020-11-16 MED ORDER — TRAZODONE HCL 50 MG PO TABS
50.0000 mg | ORAL_TABLET | Freq: Every day | ORAL | Status: DC
  Administered 2020-11-16 – 2020-11-21 (×6): 50 mg via ORAL
  Filled 2020-11-16 (×6): qty 1

## 2020-11-16 MED ORDER — SENNOSIDES-DOCUSATE SODIUM 8.6-50 MG PO TABS
1.0000 | ORAL_TABLET | Freq: Every day | ORAL | Status: DC
  Administered 2020-11-17 – 2020-11-22 (×6): 1 via ORAL
  Filled 2020-11-16 (×6): qty 1

## 2020-11-16 MED ORDER — PANTOPRAZOLE SODIUM 40 MG PO TBEC
40.0000 mg | DELAYED_RELEASE_TABLET | Freq: Two times a day (BID) | ORAL | Status: DC
  Administered 2020-11-16 – 2020-11-22 (×12): 40 mg via ORAL
  Filled 2020-11-16 (×12): qty 1

## 2020-11-16 MED ORDER — ONDANSETRON HCL 4 MG PO TABS: 4.0000 mg | ORAL_TABLET | Freq: Four times a day (QID) | ORAL | Status: DC | PRN

## 2020-11-16 MED ORDER — ASPIRIN EC 81 MG PO TBEC
81.0000 mg | DELAYED_RELEASE_TABLET | Freq: Every day | ORAL | Status: DC
  Administered 2020-11-16 – 2020-11-21 (×6): 81 mg via ORAL
  Filled 2020-11-16 (×6): qty 1

## 2020-11-16 MED ORDER — SODIUM CHLORIDE 0.9% FLUSH
3.0000 mL | Freq: Two times a day (BID) | INTRAVENOUS | Status: DC
  Administered 2020-11-16 – 2020-11-21 (×10): 3 mL via INTRAVENOUS

## 2020-11-16 MED ORDER — VANCOMYCIN HCL 1250 MG/250ML IV SOLN
1250.0000 mg | Freq: Two times a day (BID) | INTRAVENOUS | Status: DC
  Administered 2020-11-17: 1250 mg via INTRAVENOUS
  Filled 2020-11-16 (×2): qty 250

## 2020-11-16 MED ORDER — ICOSAPENT ETHYL 1 G PO CAPS
2.0000 g | ORAL_CAPSULE | Freq: Two times a day (BID) | ORAL | Status: DC
  Administered 2020-11-16 – 2020-11-22 (×12): 2 g via ORAL
  Filled 2020-11-16 (×13): qty 2

## 2020-11-16 MED ORDER — LACTATED RINGERS IV SOLN: INTRAVENOUS | Status: AC

## 2020-11-16 MED ORDER — TAMSULOSIN HCL 0.4 MG PO CAPS
0.4000 mg | ORAL_CAPSULE | Freq: Every day | ORAL | Status: DC
  Administered 2020-11-16 – 2020-11-22 (×6): 0.4 mg via ORAL
  Filled 2020-11-16 (×6): qty 1

## 2020-11-16 MED ORDER — LACTATED RINGERS IV BOLUS (SEPSIS): 500.0000 mL | Freq: Once | INTRAVENOUS | Status: DC

## 2020-11-16 MED ORDER — GABAPENTIN 300 MG PO CAPS
300.0000 mg | ORAL_CAPSULE | Freq: Every day | ORAL | Status: DC
  Administered 2020-11-16 – 2020-11-21 (×6): 300 mg
  Filled 2020-11-16 (×6): qty 1

## 2020-11-16 MED ORDER — COLLAGENASE 250 UNIT/GM EX OINT
TOPICAL_OINTMENT | Freq: Every day | CUTANEOUS | Status: DC
  Filled 2020-11-16: qty 30

## 2020-11-16 MED ORDER — ONDANSETRON HCL 4 MG/2ML IJ SOLN
4.0000 mg | Freq: Four times a day (QID) | INTRAMUSCULAR | Status: DC | PRN
  Administered 2020-11-17 – 2020-11-21 (×7): 4 mg via INTRAVENOUS
  Filled 2020-11-16 (×7): qty 2

## 2020-11-16 MED ORDER — INSULIN ASPART 100 UNIT/ML ~~LOC~~ SOLN
4.0000 [IU] | Freq: Three times a day (TID) | SUBCUTANEOUS | Status: DC
  Administered 2020-11-16 – 2020-11-22 (×14): 4 [IU] via SUBCUTANEOUS
  Filled 2020-11-16: qty 0.04

## 2020-11-16 NOTE — Progress Notes (Signed)
Pharmacy Antibiotic Note  Brent [name]. is a 75 y.o. male admitted on 11/16/2020 with severe sepsis without septic shock of unknown source.  Pharmacy has been consulted for cefepime and vancomycin dosing.  Antibiotics administered in ED: Cefepime 2 g, metronidazole 500 mg, vancomycin 2 g (2/28 1203)  Metronidazole 500 mg IV every 8 hours continued by Attending.  Plan: Continue Cefepime 2 g IV every 8 hours Continue Vancomycin 1250 mg IV every 12 hours (Goal AUC 400-550, Expected AUC: 495.3, SCr used: 0.91) Monitor clinical picture, renal function, vancomycin levels if indicated F/U C&S, abx deescalation / LOT   Height: 6' (182.9 cm) Weight: 108.9 kg (240 lb) IBW/kg (Calculated) : 77.6  Temp (24hrs), Avg:97.4 F (36.3 C), Min:97.4 F (36.3 C), Max:97.4 F (36.3 C)  Recent Labs  Lab 11/16/20 1123  WBC 19.2*  CREATININE 0.91  LATICACIDVEN 3.5*    Estimated Creatinine Clearance: 88 mL/min (by C-G formula based on SCr of 0.91 mg/dL).    Allergies  Allergen Reactions  . Levofloxacin Rash  . Penicillins Hives and Rash    Tolerated Unasyn, cefepime and cephalexin    Antimicrobials this admission: Metronidazole 2/28 >> Cefepime 2/28 >>  Vancomycin 2/28 >>   Dose adjustments this admission:   Microbiology results: 2/28 BCx: sent 2/28 UCx: sent  Thank you for allowing pharmacy to be a part of this patient's care.  Mandel Seiden P. Legrand Como, PharmD, [place] Please utilize Amion for appropriate phone number to reach the unit pharmacist ([place]) 11/16/2020 1:54 PM

## 2020-11-16 NOTE — Progress Notes (Signed)
Wound Assessment  Posterior Glutaeus - 4W x 3L x 4D Mid sacrum 14W x 10L x 2d Upper left sacrum 1Wx1L x 3.5d Left fool 1x1x0 top bone stage 1 Right foot posterior ankle 1x1x0 stage 2 Calf 1x2x0- Ulcer Penis - stage???

## 2020-11-16 NOTE — H&P (Signed)
History and Physical        Hospital Admission Note Date: 11/16/2020  Patient name: Brent [name]. Medical record number: [id number] Date of birth: [birth year] Age: 75 y.o. Gender: male  PCP: [name], [name], MD  Patient coming from: [place] At baseline, ambulates: Bedridden  Chief Complaint    Chief Complaint  Patient presents with   Weakness   Dizziness   Hematuria      HPI:   This is a 75 year old male with past medical history of transverse myelitis s/p rituximab, paraplegia secondary to transverse myelitis, hypertension, hyperlipidemia, insulin-dependent diabetes, G-tube, CAD, stage IV sacral pressure ulcer with left gluteal abscess and L4-L5 osteomyelitis and right psoas abscess in September and followed with ID at the end of 2021, s/p diverting loop colostomy who presents today from [facility] with dizziness, generalized weakness, fatigue, low back pain, nausea and hematuria via Foley catheter x2 days.  Currently being treated for sacral wound with wound VAC.  He was recently started on doxycycline on 2/18 which she has completed but he is unsure what this is for, but believes it was for a urinary infection.  Believes he may have had some decreased ostomy output recently but has not been eating much either  ED Course: Afebrile, hypotensive (95/43-> 71/46-> 104/50 with IV fluids), on room air. Notable Labs: Sodium 137, K4.7, glucose 219, BUN 38, creatinine 0.91, WBC 19.2, Hb 10.4, platelets 348, lactic acid 3.5 INR 1.2, COVID-19. Notable Imaging: CXR pending. Patient received 3 L LR bolus and maintenance fluid, vancomycin/cefepime/metronidazole.    Vitals:   11/16/20 1315 11/16/20 1400  BP: (!) 133/56 (!) 113/53  Pulse: 85 80  Resp: (!) 21 18  Temp:  97.6 F (36.4 C)  SpO2: 98% 96%     Review of Systems:  Review of Systems  All other  systems reviewed and are negative.   Medical/Social/Family History   Past Medical History: Past Medical History:  Diagnosis Date   Allergy    CAD (coronary artery disease)    a. angioplasty of his RCA in 1990. b. bare metal stent placed in the RCA in 2000 followed by rotational atherectomy shortly after for stent restenosis. c. last cath was in 2012 showing stable moderate diffuse CAD. (70% mid LAD, 80% diagonal, 70% Ramus, 40% mid to distal RCA stent restenosis). d. Low risk nuc in 2015.   Diabetes mellitus    Diverticulosis    Elevated CK    Erectile dysfunction    Hemorrhoids    HTN (hypertension)    Hyperlipidemia    Hypertriglyceridemia    Malignant melanoma of left side of neck ([place]) 10/25/2018   Myocardial infarction (HCC)    Obesity    OSA (obstructive sleep apnea)    Persistent disorder of initiating or maintaining sleep    Personal history of colonic polyps 02/05/2003    Past Surgical History:  Procedure Laterality Date   CHOLECYSTECTOMY     CORONARY ANGIOPLASTY     CORONARY STENT PLACEMENT     stenting of the right coronary artery with followup rotational  atherectomy. (3 stents placed)   FINGER SURGERY     right   FOOT SURGERY  right   INGUINAL HERNIA REPAIR     right   IRRIGATION AND DEBRIDEMENT ABSCESS N/A 06/04/2020   Procedure: IRRIGATION AND DEBRIDEMENT SACRAL WOUND;  Surgeon: Jesusita Oka, MD;  Location: [place];  Service: General;  Laterality: N/A;   LAPAROSCOPY N/A 06/04/2020   Procedure: LAPAROSCOPY ASSISTED COLOSTOMY, LAPAROSCOPY GASTROSTOMY;  Surgeon: Jesusita Oka, MD;  Location: [place];  Service: General;  Laterality: N/A;   LYSIS OF ADHESION N/A 06/04/2020   Procedure: LYSIS OF ADHESION;  Surgeon: Jesusita Oka, MD;  Location: [place];  Service: General;  Laterality: N/A;   melanoma removal     neck   ORTHOPEDIC SURGERY     foot right   rotator cuff surg     Bil    Medications: Prior to Admission  medications   Medication Sig Start Date End Date Taking? Authorizing Provider  acetaminophen (TYLENOL) 325 MG tablet Take 2 tablets (650 mg total) by mouth every 6 (six) hours as needed for headache. 04/24/20  Yes Love, Ivan Anchors, PA-C  acetaminophen (TYLENOL) 500 MG tablet Take 500 mg by mouth in the morning and at bedtime.   Yes [provider]  albuterol (PROVENTIL) (2.5 MG/3ML) 0.083% nebulizer solution Take 3 mLs (2.5 mg total) by nebulization once as needed for wheezing or shortness of breath (or anaphylaxis due to Rituxan infusion). 04/24/20  Yes Love, Ivan Anchors, PA-C  Amino Acids-Protein Hydrolys (FEEDING SUPPLEMENT, PRO-STAT SUGAR FREE 64,) LIQD Take 30 mLs by mouth in the morning and at bedtime.   Yes [provider]  antiseptic oral rinse (BIOTENE) LIQD 10 mLs by Mouth Rinse route 2 (two) times daily. Rinse and spit   Yes [provider]  Ascorbic Acid (VITA-C PO) Take 500 mg by mouth daily.   Yes [provider]  aspirin EC 81 MG tablet Take 81 mg by mouth at bedtime. Swallow whole.   Yes [provider]  atorvastatin (LIPITOR) 20 MG tablet Take 1 tablet (20 mg total) by mouth daily. 03/26/20  Yes Shelly Coss, MD  cholestyramine (QUESTRAN) 4 g packet Take 4 g by mouth daily.   Yes [provider]  diclofenac Sodium (VOLTAREN) 1 % GEL Apply 2 g topically 2 (two) times daily. Apply to Right shoulder   Yes [provider]  doxycycline (VIBRAMYCIN) 100 MG capsule Take 100 mg by mouth 2 (two) times daily. 11/06/20  Yes [provider]  ferrous sulfate 300 (60 Fe) MG/5ML syrup Take 300 mg by mouth daily.   Yes [provider]  fluticasone (FLONASE) 50 MCG/ACT nasal spray Place 2 sprays into both nostrils 2 (two) times daily. 04/24/20  Yes Love, Ivan Anchors, PA-C  gabapentin (NEURONTIN) 300 MG capsule Place 1 capsule (300 mg total) into feeding tube at bedtime. Patient taking differently: Take 300 mg by mouth 3 (three) times  daily. 06/10/20  Yes Rai, Ripudeep K, MD  Icosapent Ethyl (VASCEPA) 1 g CAPS Take 2 capsules (2 g total) by mouth 2 (two) times daily. 07/19/19  Yes Dunn, Dayna N, PA-C  insulin aspart (NOVOLOG) 100 UNIT/ML injection Inject 0-15 Units into the skin every 4 (four) hours. Sliding scale  CBG 70 - 120: 0 units: CBG 121 - 150: 2 units; CBG 151 - 200: 3 units; CBG 201 - 250: 5 units; CBG 251 - 300: 8 units;CBG 301 - 350: 11 units; CBG 351 - 400: 15 units; CBG > 400 : 15 units and notify MD Patient taking differently: Inject 0-15 Units into  the skin every 4 (four) hours. Sliding scale  CBG 70 - 200: 0 units: CBG 201 - 250: 2 units; CBG 251 - 300: 4 units; CBG 301 - 350: 6 units; CBG 351 - 400: 8 units;CBG 401 - 450: 10 units; CBG 451 - 700: 15units and notify MD 06/10/20  Yes Rai, Ripudeep K, MD  insulin glargine (LANTUS) 100 unit/mL SOPN Inject 15 Units into the skin at bedtime. Patient taking differently: Inject 42 Units into the skin at bedtime. 06/10/20  Yes Rai, Ripudeep K, MD  lactulose (CHRONULAC) 10 GM/15ML solution Take 15 g by mouth as needed for mild constipation.   Yes [provider]  Lidocaine 4 % PTCH Apply 1 patch topically daily. Apply to right hip in the morning remove at night   Yes [provider]  lisinopril (ZESTRIL) 10 MG tablet Take 10 mg by mouth daily. 11/03/20  Yes [provider]  meclizine (ANTIVERT) 12.5 MG tablet Take 12.5 mg by mouth daily. 12.5 mg daily And 12.5mg  every 8 hours as needed for vertigo 11/03/20  Yes [provider]  Menthol (ICY HOT ADVANCED RELIEF) 7.5 % PTCH Apply 1 patch topically daily. Place on left arm every morning remove every night   Yes [provider]  Menthol, Topical Analgesic, (BIOFREEZE) 4 % GEL Apply 1 application topically in the morning, at noon, and at bedtime.    Yes [provider]  metFORMIN (GLUCOPHAGE) 1000 MG tablet Take 1,000 mg by mouth daily.   Yes [provider]  naproxen (EC  NAPROSYN) 500 MG EC tablet Take 500 mg by mouth at bedtime. 11/06/20  Yes [provider]  nutrition supplement, JUVEN, (JUVEN) PACK Take 1 packet by mouth 2 (two) times daily between meals. 11/12/20 11/18/20 Yes [provider]  ondansetron (ZOFRAN) 4 MG tablet Take 4 mg by mouth every 8 (eight) hours as needed for nausea. 10/16/20  Yes [provider]  pantoprazole (PROTONIX) 40 MG tablet Take 40 mg by mouth 2 (two) times daily. 10/19/20  Yes [provider]  Polyethyl Glycol-Propyl Glycol (SYSTANE) 0.4-0.3 % SOLN Place 2 drops into both eyes 2 (two) times daily.   Yes [provider]  polyvinyl alcohol (LIQUIFILM TEARS) 1.4 % ophthalmic solution Place 2 drops into both eyes as needed for dry eyes.   Yes [provider]  saccharomyces boulardii (FLORASTOR) 250 MG capsule Place 1 capsule (250 mg total) into feeding tube 2 (two) times daily. Patient taking differently: Take 250 mg by mouth 2 (two) times daily. 06/10/20  Yes Rai, Ripudeep K, MD  senna-docusate (SENOKOT-S) 8.6-50 MG tablet Take 1 tablet by mouth daily.   Yes [provider]  sertraline (ZOLOFT) 25 MG tablet Take 25 mg by mouth daily. Give with 50mg  dose 11/10/20  Yes [provider]  sertraline (ZOLOFT) 50 MG tablet Take 50 mg by mouth daily. Give with 25mg  dose 11/10/20  Yes [provider]  tamsulosin (FLOMAX) 0.4 MG CAPS capsule Take 1 capsule (0.4 mg total) by mouth daily after supper. 06/10/20  Yes Rai, Ripudeep K, MD  traMADol (ULTRAM) 50 MG tablet Take 1 tablet (50 mg total) by mouth every 12 (twelve) hours as needed for moderate pain. 06/10/20  Yes Rai, Ripudeep K, MD  traZODone (DESYREL) 50 MG tablet Take 50 mg by mouth at bedtime.   Yes [provider]  vitamin B-12 (CYANOCOBALAMIN) 500 MCG tablet Take 500 mcg by mouth daily.   Yes [provider]  zinc sulfate  220 (50 Zn) MG capsule Place 1 capsule (220 mg total) into feeding tube  daily. Patient taking differently: Take 220 mg by mouth daily. 06/10/20  Yes Rai, Ripudeep K, MD  Multiple Vitamin (MULTIVITAMIN WITH MINERALS) TABS tablet Place 1 tablet into feeding tube daily. Centrum Silver Men's Patient not taking: Reported on 11/16/2020 06/10/20   Rai, Vernelle Emerald, MD  ondansetron (ZOFRAN-ODT) 4 MG disintegrating tablet Take 1 tablet (4 mg total) by mouth 2 (two) times daily. X 21 days Patient not taking: Reported on 11/16/2020 06/10/20   Rai, Vernelle Emerald, MD  pantoprazole sodium (PROTONIX) 40 mg/20 mL PACK Place 20 mLs (40 mg total) into feeding tube daily. Patient not taking: Reported on 11/16/2020 06/10/20   Mendel Corning, MD    Allergies:   Allergies  Allergen Reactions   Levofloxacin Rash   Penicillins Hives and Rash    Tolerated Unasyn, cefepime and cephalexin    Social History:  reports that he quit smoking about 40 years ago. His smoking use included cigarettes. He has a 45.00 pack-year smoking history. He has never used smokeless tobacco. He reports previous alcohol use of about 7.0 - 14.0 standard drinks of alcohol per week. He reports that he does not use drugs.  Family History: Family History  Problem Relation Age of Onset   Liver cancer Father    Lung cancer Father    Heart disease Father    Heart disease Sister    Lung cancer Mother    COPD Mother    Obesity Mother    Colon cancer Neg Hx    Stomach cancer Neg Hx    Pancreatic cancer Neg Hx      Objective   Physical Exam: Blood pressure (!) 113/53, pulse 80, temperature 97.6 F (36.4 C), resp. rate 18, height 6' (1.829 m), weight 108.9 kg, SpO2 96 %.  Physical Exam Vitals and nursing note reviewed.  Constitutional:      General: He is not in acute distress. HENT:     Head: Normocephalic.  Eyes:     Conjunctiva/sclera: Conjunctivae normal.  Cardiovascular:     Rate and Rhythm: Normal rate and regular rhythm.  Pulmonary:     Effort: Pulmonary effort is normal. No  respiratory distress.     Comments: Sats dropped briefly to 84% with good pleth before returning back to 97% without any intervention Abdominal:     General: Abdomen is flat. Bowel sounds are normal.     Comments: G-tube and ostomy without surrounding erythema  Genitourinary:    Comments: Bright red blood mixed with urine in Foley bag but is somewhat clear Musculoskeletal:     Comments: Bilateral lower extremity dressings for wounds with 1+ pitting edema bilaterally  Skin:    Coloration: Skin is pale.     Findings: No rash.  Neurological:     Mental Status: He is alert. Mental status is at baseline.  Psychiatric:        Mood and Affect: Mood normal.        Behavior: Behavior normal.     LABS on Admission: I have personally reviewed all the labs and imaging below    Basic Metabolic Panel: Recent Labs  Lab 11/16/20 1123  NA 137  K 4.7  CL 104  CO2 23  GLUCOSE 219*  BUN 38*  CREATININE 0.91  CALCIUM 8.9  MG 1.1*  PHOS 3.6   Liver Function Tests: Recent Labs  Lab 11/16/20 1123  AST 25  ALT 15  ALKPHOS 88  BILITOT 0.5  PROT 6.0*  ALBUMIN 2.6*   No results for input(s): LIPASE, AMYLASE in the last 168 hours. No results for input(s): AMMONIA in the last 168 hours. CBC: Recent Labs  Lab 11/16/20 1123  WBC 19.2*  NEUTROABS 15.6*  HGB 10.4*  HCT 34.6*  MCV 79.4*  PLT 348   Cardiac Enzymes: No results for input(s): CKTOTAL, CKMB, CKMBINDEX, TROPONINI in the last 168 hours. BNP: Invalid input(s): POCBNP CBG: No results for input(s): GLUCAP in the last 168 hours.  Radiological Exams on Admission:  No results found.    EKG: normal EKG, normal sinus rhythm   A & P   Principal Problem:   Sepsis ([place]) Active Problems:   Essential hypertension   Controlled type 2 diabetes mellitus with hyperglycemia, with long-term current use of insulin (HCC)   Hematuria   1. Severe sepsis without septic shock of unknown source a. Sepsis criteria: Tachypnea,  leukocytosis, lactic acidosis with hypotension that resolved with fluids b. Trend lactic acid c. Continue IV fluids d. Follow-up CXR e. Broad-spectrum antibiotics: Vancomycin/cefepime/metronidazole f. CT hematuria work-up as below which will include abdomen/pelvis to evaluate for infectious source  2. Hematuria in the setting of chronic Indwelling Foley a. Only on aspirin, no anticoagulation b. Urology consulted, discussed with Dr. Jeffie Pollock who recommended CT hematuria work-up and will follow up   3. Microcytic anemia, at baseline  4. Insulin-dependent diabetes a. HbA1c b. Basal bolus dosing with sliding scale  5. Hypertension a. Holding home antihypertensives as he has had soft BPs here  6. Hyperlipidemia a. Continue statin and icosapent ethyl  7. History of paraplegia secondary to transverse myelitis with sacral decubitus ulcer, present on arrival a. Bedbound b. I suspect his chronic Foley was placed for this reason c. Has wound VAC at facility which was removed prior to arrival d. WOCN consult  8. Bilateral lower extremity chronic wounds a. WOCN  9. History of dysphagia s/p G-tube a. Tube feeds    DVT prophylaxis: SCDs   Code Status: DNR  Diet: Tube feeds Family Communication: Admission, patients condition and plan of care including tests being ordered have been discussed with the patient who indicates understanding and agrees with the plan and Code Status.  Disposition Plan: The appropriate patient status for this patient is INPATIENT. Inpatient status is judged to be reasonable and necessary in order to provide the required intensity of service to ensure the patient's safety. The patient's presenting symptoms, physical exam findings, and initial radiographic and laboratory data in the context of their chronic comorbidities is felt to place them at high risk for further clinical deterioration. Furthermore, it is not anticipated that the patient will be medically stable for  discharge from the hospital within 2 midnights of admission. The following factors support the patient status of inpatient.   " The patient's presenting symptoms include hematuria, back pain. " The worrisome physical exam findings include hematuria. " The initial radiographic and laboratory data are worrisome because of leukocytosis and lactic acidosis. " The chronic co-morbidities include as above.   * I certify that at the point of admission it is my clinical judgment that the patient will require inpatient hospital care spanning beyond 2 midnights from the point of admission due to high intensity of service, high risk for further deterioration and high frequency of surveillance required.*    The medical decision making on this patient was of high complexity and the patient is at high risk for clinical deterioration, therefore  this is a level 3 admission.  Consultants   [place]  Procedures   none  Time Spent on Admission: 64 minutes    Harold Hedge, DO [facility]  11/16/2020, 2:11 PM

## 2020-11-16 NOTE — ED Triage Notes (Signed)
BIB EMS from [facility]. Dizziness, weakness, fatigue, nausea, hematuria to foley x2 days. Patient pale on arrival. Denies vomiting/diarrhea

## 2020-11-16 NOTE — ED Provider Notes (Signed)
[name]   CSN: [id number] Arrival date & time: 11/16/20  1035     History Chief Complaint  Patient presents with  . Weakness  . Dizziness  . Hematuria    Brent [name]. is a 75 y.o. male.  Patient stays at a nursing home and has an indwelling Foley along with decubiti and a PEG tube.  Patient supposedly has been having blood in his urine.  And he has been feeling weak.  The history is provided by the patient and medical records. No language interpreter was used.  Weakness Severity:  Moderate Onset quality:  Sudden Timing:  Constant Progression:  Worsening Chronicity:  New Context: not alcohol use   Relieved by:  Nothing Worsened by:  Nothing Ineffective treatments:  None tried Associated symptoms: dizziness   Associated symptoms: no abdominal pain, no chest pain, no cough, no diarrhea, no frequency, no headaches and no seizures   Dizziness Associated symptoms: weakness   Associated symptoms: no chest pain, no diarrhea and no headaches   Hematuria Pertinent negatives include no chest pain, no abdominal pain and no headaches.       Past Medical History:  Diagnosis Date  . Allergy   . CAD (coronary artery disease)    a. angioplasty of his RCA in 1990. b. bare metal stent placed in the RCA in 2000 followed by rotational atherectomy shortly after for stent restenosis. c. last cath was in 2012 showing stable moderate diffuse CAD. (70% mid LAD, 80% diagonal, 70% Ramus, 40% mid to distal RCA stent restenosis). d. Low risk nuc in 2015.  . Diabetes mellitus   . Diverticulosis   . Elevated CK   . Erectile dysfunction   . Hemorrhoids   . HTN (hypertension)   . Hyperlipidemia   . Hypertriglyceridemia   . Malignant melanoma of left side of neck ([place]) 10/25/2018  . Myocardial infarction ([place])   . Obesity   . OSA (obstructive sleep apnea)   . Persistent disorder of initiating or maintaining sleep   . Personal history  of colonic polyps 02/05/2003    Patient Active Problem List   Diagnosis Date Noted  . Sepsis ([place]) 11/16/2020  . Encounter for monitoring immunomodulating therapy 08/20/2020  . High risk medication use 08/20/2020  . Psoas abscess, right ([place]) 06/05/2020  . Gram-negative bacteremia 06/05/2020  . Malnutrition of moderate degree 06/04/2020  . Lumbar discitis 06/02/2020  . Abscess, gluteal, left 06/02/2020  . Palliative care by specialist   . Goals of care, counseling/discussion   . FTT (failure to thrive) in adult 06/01/2020  . Abdominal pain   . E. coli UTI   . Sacral ulcer, with necrosis of muscle ([place]) 04/09/2020  . 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 Transverse myelitis ([place])   . Labile blood pressure   . Labile blood glucose   . Neurogenic bladder   . Neurogenic bowel   . Acute blood loss anemia   . Transaminitis   . Controlled type 2 diabetes mellitus with hyperglycemia, with long-term current use of insulin ([place])   . Spinal cord infarction ([place]) 03/25/2020  . Quadriplegia and quadriparesis ([place])   . Dyslipidemia   . HCAP (healthcare-associated pneumonia)   . Acute on chronic respiratory failure with hypoxia ([place])   . Acute hypoxemic respiratory failure ([place])   . Quadriplegia ([place])   . Coronary artery disease involving native coronary artery of native heart without angina pectoris   . Steroid-induced hyperglycemia   .  Diabetic peripheral neuropathy ([place])   . Tachypnea   . Hyponatremia   . AKI (acute kidney injury) ([place])   . Weakness 03/09/2020  . Chest pain 03/08/2020  . Right leg numbness   . Malignant melanoma of left side of neck ([place]) 10/25/2018  . Facial neuritis 10/25/2018  . Myofascial pain syndrome 10/25/2018  . Occipital neuralgia of left side 10/25/2018  . CAP (community acquired pneumonia) 09/08/2017  . Viral URI with cough 08/02/2017  . Male hypogonadism 06/20/2016  . Renal lesion 06/20/2016  . Insomnia 08/07/2015  . Enlarged prostate with lower urinary  tract symptoms (LUTS) 11/07/2014  . Balanitis 07/30/2012  . Bladder neck obstruction 07/30/2012  . Prostate nodule 06/18/2012  . Recurrent nephrolithiasis 06/18/2012  . Urinary urgency 06/18/2012  . Benign neoplasm of colon 08/29/2011  . DEPRESSION, SITUATIONAL, ACUTE 01/23/2010  . Essential hypertension 05/30/2009  . Coronary atherosclerosis 05/30/2009  . Obstructive sleep apnea 11/28/2008  . OBESITY 09/23/2008  . ERECTILE DYSFUNCTION 11/26/2007  . Well controlled type 2 diabetes mellitus with peripheral circulatory disorder ([place]) 05/09/2007  . Hyperlipidemia 05/09/2007  . MYOCARDIAL INFARCTION, HX OF 05/09/2007  . DIVERTICULOSIS, COLON 05/09/2007    Past Surgical History:  Procedure Laterality Date  . CHOLECYSTECTOMY    . CORONARY ANGIOPLASTY    . CORONARY STENT PLACEMENT     stenting of the right coronary artery with followup rotational  atherectomy. (3 stents placed)  . FINGER SURGERY     right  . FOOT SURGERY     right  . INGUINAL HERNIA REPAIR     right  . IRRIGATION AND DEBRIDEMENT ABSCESS N/A 06/04/2020   Procedure: IRRIGATION AND DEBRIDEMENT SACRAL WOUND;  Surgeon: Jesusita Oka, MD;  Location: [facility];  Service: General;  Laterality: N/A;  . LAPAROSCOPY N/A 06/04/2020   Procedure: LAPAROSCOPY ASSISTED COLOSTOMY, LAPAROSCOPY GASTROSTOMY;  Surgeon: Jesusita Oka, MD;  Location: [facility];  Service: General;  Laterality: N/A;  . LYSIS OF ADHESION N/A 06/04/2020   Procedure: LYSIS OF ADHESION;  Surgeon: Jesusita Oka, MD;  Location: [facility];  Service: General;  Laterality: N/A;  . melanoma removal     neck  . ORTHOPEDIC SURGERY     foot right  . rotator cuff surg     Bil       Family History  Problem Relation Age of Onset  . Liver cancer Father   . Lung cancer Father   . Heart disease Father   . Heart disease Sister   . Lung cancer Mother   . COPD Mother   . Obesity Mother   . Colon cancer Neg Hx   . Stomach cancer Neg Hx   . Pancreatic cancer Neg Hx      Social History   Tobacco Use  . Smoking status: Former Smoker    Packs/day: 1.50    Years: 30.00    Pack years: 45.00    Types: Cigarettes    Quit date: 09/19/1980    Years since quitting: 40.1  . Smokeless tobacco: Never Used  Vaping Use  . Vaping Use: Never used  Substance Use Topics  . Alcohol use: Not Currently    Alcohol/week: 7.0 - 14.0 standard drinks    Types: 7 - 14 Shots of liquor per week  . Drug use: No    Home Medications Prior to Admission medications   Medication Sig Start Date End Date Taking? Authorizing Provider  acetaminophen (TYLENOL) 325 MG tablet Take 2 tablets (650 mg total) by mouth  every 6 (six) hours as needed for headache. 04/24/20  Yes Love, Ivan Anchors, PA-C  acetaminophen (TYLENOL) 500 MG tablet Take 500 mg by mouth in the morning and at bedtime.   Yes [provider]  albuterol (PROVENTIL) (2.5 MG/3ML) 0.083% nebulizer solution Take 3 mLs (2.5 mg total) by nebulization once as needed for wheezing or shortness of breath (or anaphylaxis due to Rituxan infusion). 04/24/20  Yes Love, Ivan Anchors, PA-C  Amino Acids-Protein Hydrolys (FEEDING SUPPLEMENT, PRO-STAT SUGAR FREE 64,) LIQD Take 30 mLs by mouth in the morning and at bedtime.   Yes [provider]  antiseptic oral rinse (BIOTENE) LIQD 10 mLs by Mouth Rinse route 2 (two) times daily. Rinse and spit   Yes [provider]  Ascorbic Acid (VITA-C PO) Take 500 mg by mouth daily.   Yes [provider]  aspirin EC 81 MG tablet Take 81 mg by mouth at bedtime. Swallow whole.   Yes [provider]  atorvastatin (LIPITOR) 20 MG tablet Take 1 tablet (20 mg total) by mouth daily. 03/26/20  Yes Shelly Coss, MD  cholestyramine (QUESTRAN) 4 g packet Take 4 g by mouth daily.   Yes [provider]  diclofenac Sodium (VOLTAREN) 1 % GEL Apply 2 g topically 2 (two) times daily. Apply to Right shoulder   Yes [provider]  doxycycline (VIBRAMYCIN) 100 MG capsule  Take 100 mg by mouth 2 (two) times daily. 11/06/20  Yes [provider]  ferrous sulfate 300 (60 Fe) MG/5ML syrup Take 300 mg by mouth daily.   Yes [provider]  fluticasone (FLONASE) 50 MCG/ACT nasal spray Place 2 sprays into both nostrils 2 (two) times daily. 04/24/20  Yes Love, Ivan Anchors, PA-C  gabapentin (NEURONTIN) 300 MG capsule Place 1 capsule (300 mg total) into feeding tube at bedtime. Patient taking differently: Take 300 mg by mouth 3 (three) times daily. 06/10/20  Yes Rai, Ripudeep K, MD  Icosapent Ethyl (VASCEPA) 1 g CAPS Take 2 capsules (2 g total) by mouth 2 (two) times daily. 07/19/19  Yes Dunn, Dayna N, PA-C  insulin aspart (NOVOLOG) 100 UNIT/ML injection Inject 0-15 Units into the skin every 4 (four) hours. Sliding scale  CBG 70 - 120: 0 units: CBG 121 - 150: 2 units; CBG 151 - 200: 3 units; CBG 201 - 250: 5 units; CBG 251 - 300: 8 units;CBG 301 - 350: 11 units; CBG 351 - 400: 15 units; CBG > 400 : 15 units and notify MD Patient taking differently: Inject 0-15 Units into the skin every 4 (four) hours. Sliding scale  CBG 70 - 200: 0 units: CBG 201 - 250: 2 units; CBG 251 - 300: 4 units; CBG 301 - 350: 6 units; CBG 351 - 400: 8 units;CBG 401 - 450: 10 units; CBG 451 - 700: 15units and notify MD 06/10/20  Yes Rai, Ripudeep K, MD  insulin glargine (LANTUS) 100 unit/mL SOPN Inject 15 Units into the skin at bedtime. Patient taking differently: Inject 42 Units into the skin at bedtime. 06/10/20  Yes Rai, Ripudeep K, MD  lactulose (CHRONULAC) 10 GM/15ML solution Take 15 g by mouth as needed for mild constipation.   Yes [provider]  Lidocaine 4 % PTCH Apply 1 patch topically daily. Apply to right hip in the morning remove at night   Yes [provider]  lisinopril (ZESTRIL) 10 MG tablet Take 10 mg by mouth daily. 11/03/20  Yes [provider]  meclizine (ANTIVERT) 12.5 MG tablet  Take 12.5 mg by mouth daily. 12.5 mg daily And 12.5mg  every 8 hours as  needed for vertigo 11/03/20  Yes [provider]  Menthol (ICY HOT ADVANCED RELIEF) 7.5 % PTCH Apply 1 patch topically daily. Place on left arm every morning remove every night   Yes [provider]  Menthol, Topical Analgesic, (BIOFREEZE) 4 % GEL Apply 1 application topically in the morning, at noon, and at bedtime.    Yes [provider]  metFORMIN (GLUCOPHAGE) 1000 MG tablet Take 1,000 mg by mouth daily.   Yes [provider]  naproxen (EC NAPROSYN) 500 MG EC tablet Take 500 mg by mouth at bedtime. 11/06/20  Yes [provider]  nutrition supplement, JUVEN, (JUVEN) PACK Take 1 packet by mouth 2 (two) times daily between meals. 11/12/20 11/18/20 Yes [provider]  ondansetron (ZOFRAN) 4 MG tablet Take 4 mg by mouth every 8 (eight) hours as needed for nausea. 10/16/20  Yes [provider]  pantoprazole (PROTONIX) 40 MG tablet Take 40 mg by mouth 2 (two) times daily. 10/19/20  Yes [provider]  Polyethyl Glycol-Propyl Glycol (SYSTANE) 0.4-0.3 % SOLN Place 2 drops into both eyes 2 (two) times daily.   Yes [provider]  polyvinyl alcohol (LIQUIFILM TEARS) 1.4 % ophthalmic solution Place 2 drops into both eyes as needed for dry eyes.   Yes [provider]  saccharomyces boulardii (FLORASTOR) 250 MG capsule Place 1 capsule (250 mg total) into feeding tube 2 (two) times daily. Patient taking differently: Take 250 mg by mouth 2 (two) times daily. 06/10/20  Yes Rai, Ripudeep K, MD  senna-docusate (SENOKOT-S) 8.6-50 MG tablet Take 1 tablet by mouth daily.   Yes [provider]  sertraline (ZOLOFT) 25 MG tablet Take 25 mg by mouth daily. Give with 50mg  dose 11/10/20  Yes [provider]  sertraline (ZOLOFT) 50 MG tablet Take 50 mg by mouth daily. Give with 25mg  dose 11/10/20  Yes [provider]  tamsulosin (FLOMAX) 0.4 MG CAPS capsule Take 1 capsule (0.4 mg total) by mouth daily after supper.  06/10/20  Yes Rai, Ripudeep K, MD  traMADol (ULTRAM) 50 MG tablet Take 1 tablet (50 mg total) by mouth every 12 (twelve) hours as needed for moderate pain. 06/10/20  Yes Rai, Ripudeep K, MD  traZODone (DESYREL) 50 MG tablet Take 50 mg by mouth at bedtime.   Yes [provider]  vitamin B-12 (CYANOCOBALAMIN) 500 MCG tablet Take 500 mcg by mouth daily.   Yes [provider]  zinc sulfate 220 (50 Zn) MG capsule Place 1 capsule (220 mg total) into feeding tube daily. Patient taking differently: Take 220 mg by mouth daily. 06/10/20  Yes Rai, Ripudeep K, MD  Multiple Vitamin (MULTIVITAMIN WITH MINERALS) TABS tablet Place 1 tablet into feeding tube daily. Centrum Silver Men's Patient not taking: Reported on 11/16/2020 06/10/20   Rai, Vernelle Emerald, MD  ondansetron (ZOFRAN-ODT) 4 MG disintegrating tablet Take 1 tablet (4 mg total) by mouth 2 (two) times daily. X 21 days Patient not taking: Reported on 11/16/2020 06/10/20   Rai, Vernelle Emerald, MD  pantoprazole sodium (PROTONIX) 40 mg/20 mL PACK Place 20 mLs (40 mg total) into feeding tube daily. Patient not taking: Reported on 11/16/2020 06/10/20   Mendel Corning, MD    Allergies    Levofloxacin and Penicillins  Review of Systems   Review of Systems  Constitutional: Negative for appetite change and fatigue.  HENT: Negative for congestion, ear discharge and  sinus pressure.   Eyes: Negative for discharge.  Respiratory: Negative for cough.   Cardiovascular: Negative for chest pain.  Gastrointestinal: Negative for abdominal pain and diarrhea.  Genitourinary: Positive for hematuria. Negative for frequency.  Musculoskeletal: Negative for back pain.  Skin: Negative for rash.  Neurological: Positive for dizziness and weakness. Negative for seizures and headaches.  Psychiatric/Behavioral: Negative for hallucinations.    Physical Exam Updated Vital Signs BP (!) 133/56   Pulse 85   Temp (!) 97.4 F (36.3 C) (Oral)   Resp (!) 21   Ht 6' (1.829  m)   Wt 108.9 kg   SpO2 98%   BMI 32.55 kg/m   Physical Exam Vitals and nursing note reviewed.  Constitutional:      Appearance: He is well-developed.  HENT:     Head: Normocephalic.     Nose: Nose normal.  Eyes:     General: No scleral icterus.    Extraocular Movements: EOM normal.     Conjunctiva/sclera: Conjunctivae normal.  Neck:     Thyroid: No thyromegaly.  Cardiovascular:     Rate and Rhythm: Normal rate and regular rhythm.     Heart sounds: No murmur heard. No friction rub. No gallop.   Pulmonary:     Breath sounds: No stridor. No wheezing or rales.  Chest:     Chest wall: No tenderness.  Abdominal:     General: There is no distension.     Tenderness: There is no abdominal tenderness. There is no rebound.     Comments: PEG tube placed  Genitourinary:    Comments: Foley with appears to be blood Musculoskeletal:        General: No edema. Normal range of motion.     Cervical back: Neck supple.  Lymphadenopathy:     Cervical: No cervical adenopathy.  Skin:    Findings: No erythema or rash.     Comments: Large decubitus that is bandaged  Neurological:     Mental Status: He is alert and oriented to person, place, and time.     Motor: No abnormal muscle tone.     Coordination: Coordination normal.  Psychiatric:        Mood and Affect: Mood and affect normal.        Behavior: Behavior normal.     ED Results / Procedures / Treatments   Labs (all labs ordered are listed, but only abnormal results are displayed) Labs Reviewed  LACTIC ACID, PLASMA - Abnormal; Notable for the following components:      Result Value   Lactic Acid, Venous 3.5 (*)    All other components within normal limits  COMPREHENSIVE METABOLIC PANEL - Abnormal; Notable for the following components:   Glucose, Bld 219 (*)    BUN 38 (*)    Total Protein 6.0 (*)    Albumin 2.6 (*)    All other components within normal limits  CBC WITH DIFFERENTIAL/PLATELET - Abnormal; Notable for the  following components:   WBC 19.2 (*)    Hemoglobin 10.4 (*)    HCT 34.6 (*)    MCV 79.4 (*)    MCH 23.9 (*)    RDW 18.4 (*)    Neutro Abs 15.6 (*)    Monocytes Absolute 1.4 (*)    Abs Immature Granulocytes 1.22 (*)    All other components within normal limits  APTT - Abnormal; Notable for the following components:   aPTT 37 (*)    All other components within normal limits  URINALYSIS, ROUTINE W REFLEX MICROSCOPIC - Abnormal; Notable for the following components:   Color, Urine RED (*)    APPearance TURBID (*)    Glucose, UA   (*)    Value: TEST NOT REPORTED DUE TO COLOR INTERFERENCE OF URINE PIGMENT   Hgb urine dipstick   (*)    Value: TEST NOT REPORTED DUE TO COLOR INTERFERENCE OF URINE PIGMENT   Bilirubin Urine   (*)    Value: TEST NOT REPORTED DUE TO COLOR INTERFERENCE OF URINE PIGMENT   Ketones, ur   (*)    Value: TEST NOT REPORTED DUE TO COLOR INTERFERENCE OF URINE PIGMENT   Protein, ur   (*)    Value: TEST NOT REPORTED DUE TO COLOR INTERFERENCE OF URINE PIGMENT   Nitrite   (*)    Value: TEST NOT REPORTED DUE TO COLOR INTERFERENCE OF URINE PIGMENT   Leukocytes,Ua   (*)    Value: TEST NOT REPORTED DUE TO COLOR INTERFERENCE OF URINE PIGMENT   RBC / HPF >50 (*)    WBC, UA >50 (*)    Bacteria, UA FEW (*)    All other components within normal limits  RESP PANEL BY RT-PCR (FLU A&B, COVID) ARPGX2  CULTURE, BLOOD (ROUTINE X 2)  CULTURE, BLOOD (ROUTINE X 2)  URINE CULTURE  PROTIME-INR  LACTIC ACID, PLASMA  PHOSPHORUS  MAGNESIUM    EKG None  Radiology No results found.  Procedures Procedures   Medications Ordered in ED Medications  lactated ringers infusion ( Intravenous New Bag/Given 11/16/20 1137)  lactated ringers bolus 1,000 mL (0 mLs Intravenous Stopped 11/16/20 1212)    And  lactated ringers bolus 1,000 mL (0 mLs Intravenous Stopped 11/16/20 1300)    And  lactated ringers bolus 1,000 mL (1,000 mLs Intravenous New Bag/Given 11/16/20 1300)    And  lactated  ringers bolus 500 mL (has no administration in time range)  vancomycin (VANCOREADY) IVPB 2000 mg/400 mL (2,000 mg Intravenous New Bag/Given 11/16/20 1203)  aspirin EC tablet 81 mg (has no administration in time range)  traMADol (ULTRAM) tablet 50 mg (has no administration in time range)  atorvastatin (LIPITOR) tablet 20 mg (has no administration in time range)  cholestyramine (QUESTRAN) packet 4 g (has no administration in time range)  icosapent Ethyl (VASCEPA) 1 g capsule 2 g (has no administration in time range)  sertraline (ZOLOFT) tablet 25 mg (has no administration in time range)  sertraline (ZOLOFT) tablet 50 mg (has no administration in time range)  traZODone (DESYREL) tablet 50 mg (has no administration in time range)  lactulose (CHRONULAC) 10 GM/15ML solution 15 g (has no administration in time range)  pantoprazole (PROTONIX) EC tablet 40 mg (has no administration in time range)  tamsulosin (FLOMAX) capsule 0.4 mg (has no administration in time range)  senna-docusate (Senokot-S) tablet 1 tablet (has no administration in time range)  gabapentin (NEURONTIN) capsule 300 mg (has no administration in time range)  albuterol (PROVENTIL) (2.5 MG/3ML) 0.083% nebulizer solution 2.5 mg (has no administration in time range)  heparin injection 5,000 Units (has no administration in time range)  sodium chloride flush (NS) 0.9 % injection 3 mL (has no administration in time range)  metroNIDAZOLE (FLAGYL) IVPB 500 mg (has no administration in time range)  vancomycin (VANCOCIN) IVPB 1000 mg/200 mL premix (has no administration in time range)  ondansetron (ZOFRAN) tablet 4 mg (has no administration in time range)    Or  ondansetron (ZOFRAN) injection 4 mg (has no administration in time range)  ceFEPIme (MAXIPIME) 2 g in sodium chloride 0.9 % 100 mL IVPB (0 g Intravenous Stopped 11/16/20 1152)  metroNIDAZOLE (FLAGYL) IVPB 500 mg (0 mg Intravenous Stopped 11/16/20 1203)    ED Course  I have reviewed the  triage vital signs and the nursing notes.  Pertinent labs & imaging results that were available during my care of the patient were reviewed by me and considered in my medical decision making (see chart for details).    CRITICAL CARE Performed by: Milton Ferguson Total critical care time: 45 minutes Critical care time was exclusive of separately billable procedures and treating other patients. Critical care was necessary to treat or prevent imminent or life-threatening deterioration. Critical care was time spent personally by me on the following activities: development of treatment plan with patient and/or surrogate as well as nursing, discussions with consultants, evaluation of patient's response to treatment, examination of patient, obtaining history from patient or surrogate, ordering and performing treatments and interventions, ordering and review of laboratory studies, ordering and review of radiographic studies, pulse oximetry and re-evaluation of patient's condition.  MDM Rules/Calculators/A&P                          Patient with urosepsis.  He responded to fluids for his hypotension and will be admitted to medicine.  Patient requests to be DNR Final Clinical Impression(s) / ED Diagnoses Final diagnoses:  None    Rx / DC Orders ED Discharge Orders    None       Milton Ferguson, MD 11/16/20 1339

## 2020-11-16 NOTE — Progress Notes (Signed)
Notified bedside nurse of need to draw repeat lactate

## 2020-11-16 NOTE — Progress Notes (Signed)
Following for code sepsis 

## 2020-11-16 NOTE — Consult Note (Signed)
Subjective: 1. Hematuria      Brent [name] is a 75 yo male who I was asked to see in consultation by Dr. Verneda Skill for hematuria in the setting of sepsis..   The patient is a paraplegic following a spinal infection last year and he has been managed with a foley catheter and has his urologic f/u with Dr. Tresa Endo with [facility] urology.  He has a history of stones.  He was brought to the ER today with hypotension.  His UA has >50 WBC and RBC's.  He had a CT hematuria study done which demonstrated no renal, ureteral or bladder stones.  There was no obstruction.  There is some stranding around the bladder and distal ureters consistent with inflammation.  The foley is in good position.  There is some air in the lumen of the bladder that is probably secondary to the catheter or bacterial fermentation.  He is asensate below the waist.   ROS:  Review of Systems  Genitourinary: Positive for hematuria.  Neurological: Positive for dizziness and weakness.  All other systems reviewed and are negative.   Allergies  Allergen Reactions  . Levofloxacin Rash  . Penicillins Hives and Rash    Tolerated Unasyn, cefepime and cephalexin    Past Medical History:  Diagnosis Date  . Allergy   . CAD (coronary artery disease)    a. angioplasty of his RCA in 1990. b. bare metal stent placed in the RCA in 2000 followed by rotational atherectomy shortly after for stent restenosis. c. last cath was in 2012 showing stable moderate diffuse CAD. (70% mid LAD, 80% diagonal, 70% Ramus, 40% mid to distal RCA stent restenosis). d. Low risk nuc in 2015.  . Diabetes mellitus   . Diverticulosis   . Elevated CK   . Erectile dysfunction   . Hemorrhoids   . HTN (hypertension)   . Hyperlipidemia   . Hypertriglyceridemia   . Malignant melanoma of left side of neck ([place]) 10/25/2018  . Myocardial infarction ([place])   . Obesity   . OSA (obstructive sleep apnea)   . Persistent disorder of initiating or maintaining sleep   .  Personal history of colonic polyps 02/05/2003    Past Surgical History:  Procedure Laterality Date  . CHOLECYSTECTOMY    . CORONARY ANGIOPLASTY    . CORONARY STENT PLACEMENT     stenting of the right coronary artery with followup rotational  atherectomy. (3 stents placed)  . FINGER SURGERY     right  . FOOT SURGERY     right  . INGUINAL HERNIA REPAIR     right  . IRRIGATION AND DEBRIDEMENT ABSCESS N/A 06/04/2020   Procedure: IRRIGATION AND DEBRIDEMENT SACRAL WOUND;  Surgeon: Jesusita Oka, MD;  Location: [facility];  Service: General;  Laterality: N/A;  . LAPAROSCOPY N/A 06/04/2020   Procedure: LAPAROSCOPY ASSISTED COLOSTOMY, LAPAROSCOPY GASTROSTOMY;  Surgeon: Jesusita Oka, MD;  Location: [facility];  Service: General;  Laterality: N/A;  . LYSIS OF ADHESION N/A 06/04/2020   Procedure: LYSIS OF ADHESION;  Surgeon: Jesusita Oka, MD;  Location: [facility];  Service: General;  Laterality: N/A;  . melanoma removal     neck  . ORTHOPEDIC SURGERY     foot right  . rotator cuff surg     Bil    Social History   Socioeconomic History  . Marital status: Married    Spouse name: Not on file  . Number of children: 1  . Years of  education: Not on file  . Highest education level: Not on file  Occupational History  . Occupation: Teacher, English as a foreign language    Comment: [employer] Use  . Smoking status: Former Smoker    Packs/day: 1.50    Years: 30.00    Pack years: 45.00    Types: Cigarettes    Quit date: 09/19/1980    Years since quitting: 40.1  . Smokeless tobacco: Never Used  Vaping Use  . Vaping Use: Never used  Substance and Sexual Activity  . Alcohol use: Not Currently    Alcohol/week: 7.0 - 14.0 standard drinks    Types: 7 - 14 Shots of liquor per week  . Drug use: No  . Sexual activity: Not Currently    Partners: Female  Other Topics Concern  . Not on file  Social History Narrative   12/14/2018: Lives with wife and 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 (son had passed away several years ago)    Recevies much of his care through [place]   Enjoys golfing   Social Determinants of Health   Financial Resource Strain: Low Risk   . Difficulty of Paying Living Expenses: Not hard at all  Food Insecurity: Not on file  Transportation Needs: No Transportation Needs  . Lack of Transportation (Medical): No  . Lack of Transportation (Non-Medical): No  Physical Activity: Not on file  Stress: Not on file  Social Connections: Not on file  Intimate Partner Violence: Not on file    Family History  Problem Relation Age of Onset  . Liver cancer Father   . Lung cancer Father   . Heart disease Father   . Heart disease Sister   . Lung cancer Mother   . COPD Mother   . Obesity Mother   . Colon cancer Neg Hx   . Stomach cancer Neg Hx   . Pancreatic cancer Neg Hx     Anti-infectives: Anti-infectives (From admission, onward)   Start     Dose/Rate Route Frequency Ordered Stop   11/17/20 0000  vancomycin (VANCOREADY) IVPB 1250 mg/250 mL        1,250 mg 166.7 mL/hr over 90 Minutes Intravenous 2 times daily 11/16/20 1359     11/16/20 2000  metroNIDAZOLE (FLAGYL) IVPB 500 mg        500 mg 100 mL/hr over 60 Minutes Intravenous Every 8 hours 11/16/20 1333     11/16/20 2000  ceFEPIme (MAXIPIME) 2 g in sodium chloride 0.9 % 100 mL IVPB        2 g 200 mL/hr over 30 Minutes Intravenous Every 8 hours 11/16/20 1340     11/16/20 1345  ceFEPIme (MAXIPIME) 2 g in sodium chloride 0.9 % 100 mL IVPB  Status:  Discontinued        2 g 200 mL/hr over 30 Minutes Intravenous  Once 11/16/20 1333 11/16/20 1334   11/16/20 1345  vancomycin (VANCOCIN) IVPB 1000 mg/200 mL premix  Status:  Discontinued        1,000 mg 200 mL/hr over 60 Minutes Intravenous  Once 11/16/20 1333 11/16/20 1341   11/16/20 1145  vancomycin (VANCOREADY) IVPB 2000 mg/400 mL        2,000 mg 200 mL/hr over 120 Minutes Intravenous  Once 11/16/20 1130 11/16/20 1347   11/16/20 1130  ceFEPIme (MAXIPIME) 2 g in sodium chloride 0.9 % 100 mL IVPB         2 g 200 mL/hr over 30 Minutes Intravenous  Once 11/16/20 1125 11/16/20 1152   11/16/20  1130  metroNIDAZOLE (FLAGYL) IVPB 500 mg        500 mg 100 mL/hr over 60 Minutes Intravenous  Once 11/16/20 1125 11/16/20 1203   11/16/20 1130  vancomycin (VANCOCIN) IVPB 1000 mg/200 mL premix  Status:  Discontinued        1,000 mg 200 mL/hr over 60 Minutes Intravenous  Once 11/16/20 1125 11/16/20 1130      Current Facility-Administered Medications  Medication Dose Route Frequency Provider Last Rate Last Admin  . albuterol (PROVENTIL) (2.5 MG/3ML) 0.083% nebulizer solution 2.5 mg  2.5 mg Nebulization Once PRN Harold Hedge, MD      . aspirin EC tablet 81 mg  81 mg Oral QHS Harold Hedge, MD      . atorvastatin (LIPITOR) tablet 20 mg  20 mg Oral QHS Marva Panda E, MD      . ceFEPIme (MAXIPIME) 2 g in sodium chloride 0.9 % 100 mL IVPB  2 g Intravenous Q8H Efraim Kaufmann, RPH      . [START ON 11/17/2020] cholestyramine light (PREVALITE) packet 4 g  4 g Oral Q24H Harold Hedge, MD      . collagenase (SANTYL) ointment   Topical Daily Harold Hedge, MD      . gabapentin (NEURONTIN) capsule 300 mg  300 mg Per Tube QHS Harold Hedge, MD      . icosapent Ethyl (VASCEPA) 1 g capsule 2 g  2 g Oral BID Harold Hedge, MD      . insulin aspart (novoLOG) injection 0-15 Units  0-15 Units Subcutaneous TID WC Harold Hedge, MD      . insulin aspart (novoLOG) injection 4 Units  4 Units Subcutaneous TID WC Harold Hedge, MD   4 Units at 11/16/20 1923  . insulin glargine (LANTUS) injection 15 Units  15 Units Subcutaneous QHS Harold Hedge, MD      . lactated ringers infusion   Intravenous Continuous Harold Hedge, MD 150 mL/hr at 11/16/20 1137 New Bag at 11/16/20 1137  . lactulose (CHRONULAC) 10 GM/15ML solution 15 g  15 g Oral PRN Harold Hedge, MD      . metroNIDAZOLE (FLAGYL) IVPB 500 mg  500 mg Intravenous Q8H Harold Hedge, MD      . ondansetron [facility]) tablet 4 mg  4 mg Oral Q6H PRN Harold Hedge,  MD       Or  . ondansetron [facility]) injection 4 mg  4 mg Intravenous Q6H PRN Harold Hedge, MD      . pantoprazole (PROTONIX) EC tablet 40 mg  40 mg Oral BID Harold Hedge, MD      . senna-docusate (Senokot-S) tablet 1 tablet  1 tablet Oral Daily Harold Hedge, MD      . sertraline (ZOLOFT) tablet 75 mg  75 mg Oral QHS Marva Panda E, MD      . sodium chloride 0.9 % infusion           . sodium chloride flush (NS) 0.9 % injection 3 mL  3 mL Intravenous Q12H Harold Hedge, MD      . tamsulosin [facility]) capsule 0.4 mg  0.4 mg Oral QPC supper Harold Hedge, MD   0.4 mg at 11/16/20 1924  . traMADol (ULTRAM) tablet 50 mg  50 mg Oral Q12H PRN Harold Hedge, MD      . traZODone (DESYREL) tablet 50 mg  50 mg Oral QHS Harold Hedge,  MD      . Derrill Memo ON 11/17/2020] vancomycin (VANCOREADY) IVPB 1250 mg/250 mL  1,250 mg Intravenous BID Shela Commons P, RPH         Objective: Vital signs in last 24 hours: BP (!) 126/56 (BP Location: Left Arm)   Pulse 90   Temp 97.7 F (36.5 C) (Oral)   Resp 18   Ht 6' (1.829 m)   Wt 108.9 kg   SpO2 98%   BMI 32.55 kg/m   Intake/Output from previous day: No intake/output data recorded. Intake/Output this shift: Total I/O In: 120 [P.O.:120] Out: -    Physical Exam Vitals reviewed.  Constitutional:      Appearance: Normal appearance. He is obese.  Abdominal:     Palpations: Abdomen is soft.     Tenderness: There is no abdominal tenderness.     Comments: LLQ ostomy.  Genitourinary:    Comments: Uncircumcised phallus with some prepucial edema ventrally and mild ulceration of the foreskin that is likely pressure necrosis from the foley.   The foley is draining slightly pink, turbid urine. Scrotum and contents normal. Skin:    General: Skin is warm and dry.  Neurological:     Mental Status: He is alert.     Comments: Quadriparesis.   Psychiatric:        Mood and Affect: Mood normal.        Behavior: Behavior normal.     Lab Results:   Results for orders placed or performed during the hospital encounter of 11/16/20 (from the past 24 hour(s))  Lactic acid, plasma     Status: Abnormal   Collection Time: 11/16/20 11:23 AM  Result Value Ref Range   Lactic Acid, Venous 3.5 (HH) 0.5 - 1.9 mmol/L  Comprehensive metabolic panel     Status: Abnormal   Collection Time: 11/16/20 11:23 AM  Result Value Ref Range   Sodium 137 135 - 145 mmol/L   Potassium 4.7 3.5 - 5.1 mmol/L   Chloride 104 98 - 111 mmol/L   CO2 23 22 - 32 mmol/L   Glucose, Bld 219 (H) 70 - 99 mg/dL   BUN 38 (H) 8 - 23 mg/dL   Creatinine, Ser 0.91 0.61 - 1.24 mg/dL   Calcium 8.9 8.9 - 10.3 mg/dL   Total Protein 6.0 (L) 6.5 - 8.1 g/dL   Albumin 2.6 (L) 3.5 - 5.0 g/dL   AST 25 15 - 41 U/L   ALT 15 0 - 44 U/L   Alkaline Phosphatase 88 38 - 126 U/L   Total Bilirubin 0.5 0.3 - 1.2 mg/dL   GFR, Estimated >60 >60 mL/min   Anion gap 10 5 - 15  CBC WITH DIFFERENTIAL     Status: Abnormal   Collection Time: 11/16/20 11:23 AM  Result Value Ref Range   WBC 19.2 (H) 4.0 - 10.5 K/uL   RBC 4.36 4.22 - 5.81 MIL/uL   Hemoglobin 10.4 (L) 13.0 - 17.0 g/dL   HCT 34.6 (L) 39.0 - 52.0 %   MCV 79.4 (L) 80.0 - 100.0 fL   MCH 23.9 (L) 26.0 - 34.0 pg   MCHC 30.1 30.0 - 36.0 g/dL   RDW 18.4 (H) 11.5 - 15.5 %   Platelets 348 150 - 400 K/uL   nRBC 0.0 0.0 - 0.2 %   Neutrophils Relative % 82 %   Neutro Abs 15.6 (H) 1.7 - 7.7 K/uL   Lymphocytes Relative 5 %   Lymphs Abs 0.9 0.7 - 4.0 K/uL  Monocytes Relative 7 %   Monocytes Absolute 1.4 (H) 0.1 - 1.0 K/uL   Eosinophils Relative 0 %   Eosinophils Absolute 0.0 0.0 - 0.5 K/uL   Basophils Relative 0 %   Basophils Absolute 0.0 0.0 - 0.1 K/uL   WBC Morphology MILD LEFT SHIFT (1-5% METAS, OCC MYELO, OCC BANDS)    Immature Granulocytes 6 %   Abs Immature Granulocytes 1.22 (H) 0.00 - 0.07 K/uL   Polychromasia PRESENT   Protime-INR     Status: None   Collection Time: 11/16/20 11:23 AM  Result Value Ref Range   Prothrombin Time  14.4 11.4 - 15.2 seconds   INR 1.2 0.8 - 1.2  APTT     Status: Abnormal   Collection Time: 11/16/20 11:23 AM  Result Value Ref Range   aPTT 37 (H) 24 - 36 seconds  Blood Culture (routine x 2)     Status: None (Preliminary result)   Collection Time: 11/16/20 11:23 AM   Specimen: BLOOD  Result Value Ref Range   Specimen Description      BLOOD RIGHT ANTECUBITAL Performed at [facility], [place] [address], [place], [place] [address]    Special Requests      BOTTLES DRAWN AEROBIC AND ANAEROBIC Blood Culture adequate volume Performed at [facility] [address], [place], [place] [address]    Culture      NO GROWTH <12 HOURS Performed at [facility] [address], [place], [place] [address]    Report Status PENDING   Phosphorus     Status: None   Collection Time: 11/16/20 11:23 AM  Result Value Ref Range   Phosphorus 3.6 2.5 - 4.6 mg/dL  Magnesium     Status: Abnormal   Collection Time: 11/16/20 11:23 AM  Result Value Ref Range   Magnesium 1.1 (L) 1.7 - 2.4 mg/dL  Hemoglobin A1c     Status: Abnormal   Collection Time: 11/16/20 11:23 AM  Result Value Ref Range   Hgb A1c MFr Bld 8.2 (H) 4.8 - 5.6 %   Mean Plasma Glucose 188.64 mg/dL  Blood Culture (routine x 2)     Status: None (Preliminary result)   Collection Time: 11/16/20 11:40 AM   Specimen: BLOOD RIGHT FOREARM  Result Value Ref Range   Specimen Description      BLOOD RIGHT FOREARM Performed at [facility] [address] [place], [place] [address]    Special Requests      BOTTLES DRAWN AEROBIC ONLY Blood Culture results may not be optimal due to an inadequate volume of blood received in culture bottles Performed at [facility] [address], [place], [place] [address]    Culture      NO GROWTH <12 HOURS Performed at [facility] [address] [place], [place] [address]    Report Status PENDING   Urinalysis, Routine w  reflex microscopic Urine, Catheterized     Status: Abnormal   Collection Time: 11/16/20 11:40 AM  Result Value Ref Range   Color, Urine RED (A) YELLOW   APPearance TURBID (A) CLEAR   Specific Gravity, Urine  1.005 - 1.030    TEST NOT REPORTED DUE TO COLOR INTERFERENCE OF URINE PIGMENT   pH  5.0 - 8.0    TEST NOT REPORTED DUE TO COLOR INTERFERENCE OF URINE PIGMENT   Glucose, UA (A) NEGATIVE mg/dL    TEST NOT REPORTED DUE TO COLOR INTERFERENCE OF URINE PIGMENT  Hgb urine dipstick (A) NEGATIVE    TEST NOT REPORTED DUE TO COLOR INTERFERENCE OF URINE PIGMENT   Bilirubin Urine (A) NEGATIVE    TEST NOT REPORTED DUE TO COLOR INTERFERENCE OF URINE PIGMENT   Ketones, ur (A) NEGATIVE mg/dL    TEST NOT REPORTED DUE TO COLOR INTERFERENCE OF URINE PIGMENT   Protein, ur (A) NEGATIVE mg/dL    TEST NOT REPORTED DUE TO COLOR INTERFERENCE OF URINE PIGMENT   Nitrite (A) NEGATIVE    TEST NOT REPORTED DUE TO COLOR INTERFERENCE OF URINE PIGMENT   Leukocytes,Ua (A) NEGATIVE    TEST NOT REPORTED DUE TO COLOR INTERFERENCE OF URINE PIGMENT   RBC / HPF >50 (H) 0 - 5 RBC/hpf   WBC, UA >50 (H) 0 - 5 WBC/hpf   Bacteria, UA FEW (A) NONE SEEN   WBC Clumps PRESENT    Mucus PRESENT   Resp Panel by RT-PCR (Flu A&B, Covid) Nasopharyngeal Swab     Status: None   Collection Time: 11/16/20 11:46 AM   Specimen: Nasopharyngeal Swab; Nasopharyngeal(NP) swabs in vial transport medium  Result Value Ref Range   SARS Coronavirus 2 by RT PCR NEGATIVE NEGATIVE   Influenza A by PCR NEGATIVE NEGATIVE   Influenza B by PCR NEGATIVE NEGATIVE  Lactic acid, plasma     Status: Abnormal   Collection Time: 11/16/20  1:34 PM  Result Value Ref Range   Lactic Acid, Venous 3.1 (HH) 0.5 - 1.9 mmol/L  Glucose, capillary     Status: Abnormal   Collection Time: 11/16/20  6:19 PM  Result Value Ref Range   Glucose-Capillary 109 (H) 70 - 99 mg/dL    BMET Recent Labs    11/16/20 1123  NA 137  K 4.7  CL 104  CO2 23  GLUCOSE 219*  BUN  38*  CREATININE 0.91  CALCIUM 8.9   PT/INR Recent Labs    11/16/20 1123  LABPROT 14.4  INR 1.2   ABG No results for input(s): PHART, HCO3 in the last 72 hours.  Invalid input(s): PCO2, PO2  Studies/Results: DG Chest Port 1 View  Result Date: 11/16/2020 CLINICAL DATA:  Sacral wound.  Sepsis EXAM: PORTABLE CHEST 1 VIEW COMPARISON:  Chest radiograph 06/04/2020 FINDINGS: Normal mediastinum and cardiac silhouette. Normal pulmonary vasculature. No evidence of effusion, infiltrate, or pneumothorax. No acute bony abnormality. IMPRESSION: No acute cardiopulmonary process. Electronically Signed   By: Suzy Bouchard M.D.   On: 11/16/2020 12:18   CT HEMATURIA WORKUP  Result Date: 11/16/2020 CLINICAL DATA:  Paraplegia.  Hematuria. EXAM: CT ABDOMEN AND PELVIS WITHOUT AND WITH CONTRAST TECHNIQUE: Multidetector CT imaging of the abdomen and pelvis was performed following the standard protocol before and following the bolus administration of intravenous contrast. CONTRAST:  11mL OMNIPAQUE IOHEXOL 300 MG/ML  SOLN COMPARISON:  Multiple exams, including CT angiogram of 03/08/2020 FINDINGS: Lower chest: Airway thickening in both lower lobes with trace right pleural effusion. Mild gynecomastia. Right coronary artery and descending thoracic aortic atherosclerotic calcification. Dependent subsegmental atelectasis in both lower lobes. Left anterior descending and circumflex coronary artery atherosclerosis. Hepatobiliary: Cholecystectomy.  Otherwise unremarkable. Pancreas: Unremarkable Spleen: Unremarkable Adrenals/Urinary Tract: Is a catheter in the urinary bladder along with a small amount of gas in the urinary bladder. Stranding around the urinary bladder noted with mild wall thickening suggesting cystitis. Layering high density in the left kidney upper pole cyst likely representing a milk of calcium cyst. Fluid density 3.2 cm left mid kidney cyst noted. Other small hypodense renal lesions are  technically too  small to characterize although statistically likely to be benign. Subtle accentuation of enhancement within the walls of the ureters, low-grade ureteritis is difficult to exclude but no focal lesion is identified. Vascular calcifications in the left renal hilum. Stomach/Bowel: Peg tube noted. Scattered colonic diverticulosis. Normal appendix. No dilated bowel. Vascular/Lymphatic: Aortoiliac atherosclerotic vascular disease. No pathologic adenopathy. Reproductive: Unremarkable Other: Small amount of ascites for example image twin the spleen in the stomach. Low-grade mesenteric and presacral edema. Musculoskeletal: Bony destructive findings in the lower sacrum compatible with chronic osteomyelitis. Progressive irregular endplate findings at G2-5 compared to the prior pelvic MRI from 06/01/2020, compatible with discitis-osteomyelitis. There is superimposed bilateral foraminal impingement at the L4-5 level due to spurring. I not see a definite abscess in the psoas muscles. IMPRESSION: 1. Stranding around the urinary bladder with mild wall thickening suggesting cystitis. Subtle accentuation of enhancement within the walls of the ureters, low-grade ureteritis is difficult to exclude but no focal lesion is identified. This is a catheter in the urinary bladder. 2. Bony destructive findings in the lower sacrum compatible with chronic osteomyelitis. Progressive irregular endplate findings at W3-8 compared to the prior pelvic MRI from 06/01/2020, compatible with discitis-osteomyelitis. There is superimposed bilateral foraminal impingement at L4-5 due to spurring. 3. Other imaging findings of potential clinical significance: Coronary atherosclerosis. Airway thickening in both lower lobes with trace right pleural effusion. Low-grade mesenteric and presacral edema. Scattered colonic diverticulosis. Mild gynecomastia. Peg tube noted. 4. Aortic atherosclerosis. Aortic Atherosclerosis (ICD10-I70.0). Electronically Signed   By:  Van Clines M.D.   On: 11/16/2020 17:06   Case discussed with Dr. Roderic Palau.  Films reviewed.  Labs reviewed.  Hospital records reviewed.   Assessment/Plan: 1. Neurogenic bladder with chronic foley. 2. Hemorrhagic cystitis.  3. Mild ulceration of the foreskin from the catheter.  Antibiotic therapy for 7-10 days for the cystitis.     He should f/u with Dr. Rosana Hoes for consideration of cystoscopy to further assess his bladder.   He might also be best served by circumcision and consideration of a suprapubic tube but I will defer that to Dr. Rosana Hoes.     No follow-ups on file.    CC: Dr/ Verneda Skill, Dr. Marva Panda and Dr. Duane Lope. [place].      Brent [name] 11/16/2020 [phone]

## 2020-11-16 NOTE — Progress Notes (Signed)
Failed attempt at MRI. Patient unable to tolerate exam due to claustrophobia, spoke with RN. No meds available. Will attempt tomorrow.

## 2020-11-16 NOTE — Plan of Care (Signed)
  Problem: Education: Goal: Knowledge of General Education information will improve Description: Including pain rating scale, medication(s)/side effects and non-pharmacologic comfort measures Outcome: Progressing   Problem: Clinical Measurements: Goal: Will remain free from infection Outcome: Progressing   Problem: Skin Integrity: Goal: Risk for impaired skin integrity will decrease Outcome: Progressing   

## 2020-11-16 NOTE — ED Notes (Addendum)
MD tegeler notified of patients bp, fluids started

## 2020-11-16 NOTE — Consult Note (Addendum)
[facility] Nurse ostomy consult note Stoma type/location: LLQ colostomy Stomal assessment/size: 1" flush stoma Peristomal assessment: pouch intact Treatment options for stomal/peristomal skin: barrier ring Output  Soft brown stool Ostomy pouching: 1pc.convex with barrier ring Education provided: None care provided by SNF. Enrolled patient in [place] program: NO WOC Nurse Consult Note: Awaiting MRI.  Will hold NPWT until this is done and patient will benefit from enzymatic debridement.  Reason for Consult:LEft gluteal abscess, chronic nonhealing.   Wound type:infectious, nonhealing Stage 4 (per wound care center notes) right ischial pressure injury with slough noted today.   Sacral pressure injury, full thickness, consistent with healing stage 3 Pressure Injury POA: Yes Measurements:  Right ischial wound:  4 cm x 3 cm x 4 cm with 30% yellow slough present Sacral:  14 cm x 10 cm x 1.3 cm pale pink nongranulating Left gluteal abscess site:  1 cm x 1 cm x 4 cm tunnel Wound bed: slough to left ischium Sacrum, pale pink nongranulating Unable to visualize wound bed of abscess, no purulence or erythema noted.  Drainage (amount, consistency, odor) moderate serosanguinous  Periwound:intact Dressing procedure/placement/frequency: Will debride ischial wound with Santyl and likely begin NPWT again in a few days.   Cleanse ischial and left gluteal/sacral wounds with NS and pat dry.  Apply Santyl to wound bed.  Cover with NS moist gauze.  Secure with dry gauze and ABD pad and tape.  Change daily.  Will follow.  Awaiting MRI, intend to re-start NPWT Wednesday after testing. Domenic Moras MSN, RN, FNP-BC [facility]ON Wound, Ostomy, Continence Nurse Pager [phone]

## 2020-11-16 NOTE — ED Notes (Signed)
Patients states currently being treated for sacral wound  with wound vac. Facility removed wound vac prior to ems arriving

## 2020-11-16 NOTE — Progress Notes (Signed)
A consult was received from an ED physician for vancomycin & Cefepime per pharmacy dosing.  The patient's profile has been reviewed for ht/wt/allergies/indication/available labs.   A one time order has been placed for vancomycin 2 gm, cefepime 2 gm & Flagyl 500 mg.    Further antibiotics/pharmacy consults should be ordered by admitting physician if indicated.                       Thank you, Eudelia Bunch, Pharm.D 11/16/2020 11:31 AM

## 2020-11-17 ENCOUNTER — Inpatient Hospital Stay (HOSPITAL_COMMUNITY): Payer: Medicare Other

## 2020-11-17 ENCOUNTER — Other Ambulatory Visit: Payer: Self-pay

## 2020-11-17 DIAGNOSIS — I10 Essential (primary) hypertension: Secondary | ICD-10-CM | POA: Diagnosis not present

## 2020-11-17 DIAGNOSIS — A419 Sepsis, unspecified organism: Secondary | ICD-10-CM | POA: Diagnosis not present

## 2020-11-17 DIAGNOSIS — R319 Hematuria, unspecified: Secondary | ICD-10-CM | POA: Diagnosis not present

## 2020-11-17 DIAGNOSIS — R6520 Severe sepsis without septic shock: Secondary | ICD-10-CM | POA: Diagnosis not present

## 2020-11-17 LAB — GLUCOSE, CAPILLARY
Glucose-Capillary: 144 mg/dL — ABNORMAL HIGH (ref 70–99)
Glucose-Capillary: 153 mg/dL — ABNORMAL HIGH (ref 70–99)
Glucose-Capillary: 177 mg/dL — ABNORMAL HIGH (ref 70–99)
Glucose-Capillary: 157 mg/dL — ABNORMAL HIGH (ref 70–99)

## 2020-11-17 LAB — BASIC METABOLIC PANEL
Anion gap: 8 (ref 5–15)
BUN: 20 mg/dL (ref 8–23)
CO2: 23 mmol/L (ref 22–32)
Calcium: 8.8 mg/dL — ABNORMAL LOW (ref 8.9–10.3)
Chloride: 110 mmol/L (ref 98–111)
Creatinine, Ser: 0.39 mg/dL — ABNORMAL LOW (ref 0.61–1.24)
GFR, Estimated: >60 mL/min (ref >60)
Glucose, Bld: 162 mg/dL — ABNORMAL HIGH (ref 70–99)
Potassium: 3.6 mmol/L (ref 3.5–5.1)
Sodium: 141 mmol/L (ref 135–145)

## 2020-11-17 LAB — PROCALCITONIN: Procalcitonin: 1.62 ng/mL

## 2020-11-17 LAB — CORTISOL-AM, BLOOD: Cortisol - AM: 8.6 ug/dL (ref 6.7–22.6)

## 2020-11-17 LAB — CBC
HCT: 28.7 % — ABNORMAL LOW (ref 39.0–52.0)
Hemoglobin: 8.7 g/dL — ABNORMAL LOW (ref 13.0–17.0)
MCH: 24 pg — ABNORMAL LOW (ref 26.0–34.0)
MCHC: 30.3 g/dL (ref 30.0–36.0)
MCV: 79.3 fL — ABNORMAL LOW (ref 80.0–100.0)
Platelets: 246 10*3/uL (ref 150–400)
RBC: 3.62 MIL/uL — ABNORMAL LOW (ref 4.22–5.81)
RDW: 18.5 % — ABNORMAL HIGH (ref 11.5–15.5)
WBC: 8.9 10*3/uL (ref 4.0–10.5)
nRBC: 0 % (ref 0.0–0.2)

## 2020-11-17 LAB — PROTIME-INR
INR: 1.2 (ref 0.8–1.2)
Prothrombin Time: 14.9 seconds (ref 11.4–15.2)

## 2020-11-17 MED ORDER — GLUCERNA 1.5 CAL PO LIQD: 1000.0000 mL | ORAL | Status: DC

## 2020-11-17 MED ORDER — ACETAMINOPHEN 325 MG PO TABS
650.0000 mg | ORAL_TABLET | Freq: Four times a day (QID) | ORAL | Status: DC | PRN
  Administered 2020-11-17 – 2020-11-19 (×5): 650 mg via ORAL
  Filled 2020-11-17 (×5): qty 2

## 2020-11-17 MED ORDER — JUVEN PO PACK
1.0000 | PACK | Freq: Two times a day (BID) | ORAL | Status: DC
  Administered 2020-11-17 – 2020-11-22 (×9): 1
  Filled 2020-11-17 (×11): qty 1

## 2020-11-17 MED ORDER — OSMOLITE 1.2 CAL PO LIQD
1000.0000 mL | ORAL | Status: DC
  Filled 2020-11-17: qty 1000

## 2020-11-17 MED ORDER — FREE WATER
150.0000 mL | Freq: Four times a day (QID) | Status: DC
  Administered 2020-11-17 – 2020-11-22 (×17): 150 mL

## 2020-11-17 MED ORDER — MAGNESIUM SULFATE 2 GM/50ML IV SOLN
2.0000 g | Freq: Once | INTRAVENOUS | Status: AC
  Administered 2020-11-17: 2 g via INTRAVENOUS
  Filled 2020-11-17: qty 50

## 2020-11-17 MED ORDER — CHLORHEXIDINE GLUCONATE CLOTH 2 % EX PADS
6.0000 | MEDICATED_PAD | Freq: Every day | CUTANEOUS | Status: DC
  Administered 2020-11-17 – 2020-11-22 (×6): 6 via TOPICAL

## 2020-11-17 MED ORDER — LORAZEPAM 2 MG/ML IJ SOLN
1.0000 mg | Freq: Once | INTRAMUSCULAR | Status: AC
  Administered 2020-11-17: 1 mg via INTRAVENOUS
  Filled 2020-11-17: qty 1

## 2020-11-17 MED ORDER — LACTATED RINGERS IV SOLN: INTRAVENOUS | Status: AC

## 2020-11-17 MED ORDER — VANCOMYCIN HCL 1000 MG/200ML IV SOLN
1000.0000 mg | Freq: Two times a day (BID) | INTRAVENOUS | Status: DC
  Administered 2020-11-17 – 2020-11-18 (×3): 1000 mg via INTRAVENOUS
  Filled 2020-11-17 (×3): qty 200

## 2020-11-17 MED ORDER — GADOBUTROL 1 MMOL/ML IV SOLN
10.0000 mL | Freq: Once | INTRAVENOUS | Status: AC | PRN
  Administered 2020-11-17: 10 mL via INTRAVENOUS

## 2020-11-17 MED ORDER — PROSOURCE TF PO LIQD
45.0000 mL | Freq: Every day | ORAL | Status: DC
  Administered 2020-11-18 – 2020-11-21 (×5): 45 mL
  Filled 2020-11-17 (×7): qty 45

## 2020-11-17 NOTE — Progress Notes (Signed)
Pharmacy Antibiotic Note  Brent [name]. is a 75 y.o. male with hx paraplegia, neurogenic bladder with chronic foley, sacral pressor ulcer, left gluteal and psoas abscess, and discitis presented to the ED on 11/16/2020 with c/o fatigue, nausea and hematuria.  He was started on vancomycin, cefepime and flagyl on admission for broad coverage for sepsis.  - 2/28 abd CT: Bony destructive findings in the lower sacrum compatible with chronic osteomyelitis. Progressive irregular endplate findings at H1-5 compared to the prior pelvic MRI from 06/01/2020, compatible with discitis-osteomyelitis.  Today, 11/17/2020: - day #2 abx - scr is low and down to baseline; paraplegia, bedbound - PCT 1.62 -  Afeb, WBC down wnl - urologist's note on 2/28 recom to treat with abx for 7-10 days for cystitis  Plan: - Adjust vancomycin dose to 1000 mg IV q12h for est AUC 482 - cefepime 2gm q8h and flagyl 500 mg q8h _________________________________________ Height: 6' (182.9 cm) Weight: 108.9 kg (240 lb) IBW/kg (Calculated) : 77.6  Temp (24hrs), Avg:97.8 F (36.6 C), Min:97.4 F (36.3 C), Max:98.1 F (36.7 C)  Recent Labs  Lab 11/16/20 1123 11/16/20 1334 11/17/20 0459  WBC 19.2*  --  8.9  CREATININE 0.91  --  0.39*  LATICACIDVEN 3.5* 3.1*  --     Estimated Creatinine Clearance: 100.1 mL/min (A) (by C-G formula based on SCr of 0.39 mg/dL (L)).    Allergies  Allergen Reactions  . Levofloxacin Rash  . Penicillins Hives and Rash    Tolerated Unasyn, cefepime and cephalexin    Thank you for allowing pharmacy to be a part of this patient's care.  Lynelle Doctor 11/17/2020 8:10 AM

## 2020-11-17 NOTE — TOC Initial Note (Signed)
Transition of Care (TOC) - Initial/Assessment Note    Patient Details  Name: Brent [name]. MRN: [id number] Date of Birth: March 12, 1946  Transition of Care [facility]) CM/SW Contact:    Brent Phi, RN Phone Number: 11/17/2020, 9:23 AM  Clinical Narrative: Confirmed from [facility] rep Brent [name] -paraplegia-w/c bound;PTA-sacral wound-woun vac;colostomy,f/c.d/c plan per spouse Brent [name] return back to SNF @ [facility]                 Expected Discharge Plan: [place] Barriers to Discharge: Continued Medical Work up   Patient Goals and CMS Choice Patient states their goals for this hospitalization and ongoing recovery are:: return back to [facility] CMS Medicare.gov Compare Post Acute Care list provided to:: Patient Choice offered to / list presented to : Patient  Expected Discharge Plan and Services Expected Discharge Plan: [place]   Discharge Planning Services: CM Consult Post Acute Care Choice: [place] Living arrangements for the past 2 months: [place]                                      Prior Living Arrangements/Services Living arrangements for the past 2 months: [place] Lives with:: Facility Resident Patient language and need for interpreter reviewed:: Yes Do you feel safe going back to the place where you live?: Yes      Need for Family Participation in Patient Care: No (Comment) Care giver support system in place?: Yes (comment) Current home services: DME (w/c;wound vac) Criminal Activity/Legal Involvement Pertinent to Current Situation/Hospitalization: No - Comment as needed  Activities of Daily Living Home Assistive Devices/Equipment: Wound Vac,Eyeglasses,Ostomy supplies,Other (Comment) (foley catheter) ADL Screening (condition at time of admission) Patient's cognitive ability adequate to safely complete daily activities?: Yes Is the patient deaf or have difficulty  hearing?: Yes Does the patient have difficulty seeing, even when wearing glasses/contacts?: No Does the patient have difficulty concentrating, remembering, or making decisions?: Yes Patient able to express need for assistance with ADLs?: Yes Does the patient have difficulty dressing or bathing?: Yes Independently performs ADLs?: No Communication: Independent Dressing (OT): Needs assistance Is this a change from baseline?: Pre-admission baseline Grooming: Dependent Is this a change from baseline?: Pre-admission baseline Feeding: Needs assistance,Dependent Is this a change from baseline?: Pre-admission baseline Bathing: Dependent Is this a change from baseline?: Pre-admission baseline Toileting: Dependent Is this a change from baseline?: Pre-admission baseline In/Out Bed: Dependent Is this a change from baseline?: Pre-admission baseline Walks in Home: Dependent Is this a change from baseline?: Pre-admission baseline Does the patient have difficulty walking or climbing stairs?: Yes Weakness of Legs: Both Weakness of Arms/Hands: Both  Permission Sought/Granted Permission sought to share information with : Case Manager Permission granted to share information with : Yes, Verbal Permission Granted  Share Information with NAME: Case Manager  Permission granted to share info w AGENCY: Brent [name] spouse [phone]        Emotional Assessment Appearance:: Appears stated age Attitude/Demeanor/Rapport: Gracious Affect (typically observed): Accepting Orientation: : Oriented to Self,Oriented to Place,Oriented to  Time,Oriented to Situation Alcohol / Substance Use: Not Applicable Psych Involvement: No (comment)  Admission diagnosis:  Hematuria [R31.9] Sepsis ([place]) [A41.9] Patient Active Problem List   Diagnosis Date Noted  . Sepsis ([place]) 11/16/2020  . Hematuria 11/16/2020  . Pressure injury of skin 11/16/2020  . Encounter for monitoring immunomodulating therapy 08/20/2020  . High  risk medication use 08/20/2020  . Psoas abscess, right ([place]) 06/05/2020  . Gram-negative bacteremia 06/05/2020  . Malnutrition of moderate degree 06/04/2020  . Lumbar discitis 06/02/2020  . Abscess, gluteal, left 06/02/2020  . Palliative care by specialist   . Goals of care, counseling/discussion   . FTT (failure to thrive) in adult 06/01/2020  . Abdominal pain   . E. coli UTI   . Sacral ulcer, with necrosis of muscle ([place]) 04/09/2020  . 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 Transverse myelitis ([place])   . Labile blood pressure   . Labile blood glucose   . Neurogenic bladder   . Neurogenic bowel   . Acute blood loss anemia   . Transaminitis   . Controlled type 2 diabetes mellitus with hyperglycemia, with long-term current use of insulin ([place])   . Spinal cord infarction ([place]) 03/25/2020  . Quadriplegia and quadriparesis ([place])   . Dyslipidemia   . HCAP (healthcare-associated pneumonia)   . Acute on chronic respiratory failure with hypoxia ([place])   . Acute hypoxemic respiratory failure ([place])   . Quadriplegia ([place])   . Coronary artery disease involving native coronary artery of native heart without angina pectoris   . Steroid-induced hyperglycemia   . Diabetic peripheral neuropathy ([place])   . Tachypnea   . Hyponatremia   . AKI (acute kidney injury) ([place])   . Weakness 03/09/2020  . Chest pain 03/08/2020  . Right leg numbness   . Malignant melanoma of left side of neck ([place]) 10/25/2018  . Facial neuritis 10/25/2018  . Myofascial pain syndrome 10/25/2018  . Occipital neuralgia of left side 10/25/2018  . CAP (community acquired pneumonia) 09/08/2017  . Viral URI with cough 08/02/2017  . Male hypogonadism 06/20/2016  . Renal lesion 06/20/2016  . Insomnia 08/07/2015  . Enlarged prostate with lower urinary tract symptoms (LUTS) 11/07/2014  . Balanitis 07/30/2012  . Bladder neck obstruction 07/30/2012  . Prostate nodule 06/18/2012  . Recurrent nephrolithiasis 06/18/2012  . Urinary urgency  06/18/2012  . Benign neoplasm of colon 08/29/2011  . DEPRESSION, SITUATIONAL, ACUTE 01/23/2010  . Essential hypertension 05/30/2009  . Coronary atherosclerosis 05/30/2009  . Obstructive sleep apnea 11/28/2008  . OBESITY 09/23/2008  . ERECTILE DYSFUNCTION 11/26/2007  . Well controlled type 2 diabetes mellitus with peripheral circulatory disorder ([place]) 05/09/2007  . Hyperlipidemia 05/09/2007  . MYOCARDIAL INFARCTION, HX OF 05/09/2007  . DIVERTICULOSIS, COLON 05/09/2007   PCP:  [name], [name], MD Pharmacy:   [id number] [place], [place] [address] [place] [address] Phone: [phone] Fax: [place], [place] - [address] [place] [address] Phone: [phone] Fax: [phone]     Social Determinants of Health (SDOH) Interventions    Readmission Risk Interventions Readmission Risk Prevention Plan 06/08/2020  Transportation Screening Complete  Medication Review (RN Care Manager) Referral to Pharmacy  PCP or Specialist appointment within 3-5 days of discharge Not Complete  PCP/Specialist Appt Not Complete comments SNF resident  [facility] or [place] Complete  SW Recovery Care/Counseling Consult Complete  Palliative Care Screening Complete  Skilled Nursing Facility Complete  Some recent data might be hidden

## 2020-11-17 NOTE — Progress Notes (Signed)
PROGRESS NOTE    Brent [name].  KGY:[id number] DOB: 12-27-1945 DOA: 11/16/2020 PCP: [name], [name], MD  Brief Narrative:  HPI per Dr. Marva Panda on 11/16/20 This is a 75 year old male with past medical history of transverse myelitis s/p rituximab, paraplegia secondary to transverse myelitis, hypertension, hyperlipidemia, insulin-dependent diabetes, G-tube, CAD, stage IV sacral pressure ulcer with left gluteal abscess and L4-L5 osteomyelitis and right psoas abscess in September and followed with ID at the end of 2021, s/p diverting loop colostomy who presents today from [facility] with dizziness, generalized weakness, fatigue, low back pain, nausea and hematuria via Foley catheter x2 days.  Currently being treated for sacral wound with wound VAC.  He was recently started on doxycycline on 2/18 which she has completed but he is unsure what this is for, but believes it was for a urinary infection.  Believes he may have had some decreased ostomy output recently but has not been eating much either  ED Course: Afebrile, hypotensive (95/43-> 71/46-> 104/50 with IV fluids), on room air. Notable Labs: Sodium 137, K4.7, glucose 219, BUN 38, creatinine 0.91, WBC 19.2, Hb 10.4, platelets 348, lactic acid 3.5 INR 1.2, COVID-19. Notable Imaging: CXR pending. Patient received 3 L LR bolus and maintenance fluid, vancomycin/cefepime/metronidazole.   **Interim History  Patient is improved slightly and going down for MRI of his pelvis.  Continues to have fevers.  We will continue empiric antibiotics for now.  If necessary will consult ID.  Hematuria is improving.  Appreciate urology following  Assessment & Plan:   Principal Problem:   Sepsis ([place]) Active Problems:   Essential hypertension   Controlled type 2 diabetes mellitus with hyperglycemia, with long-term current use of insulin (HCC)   Hematuria   Pressure injury of skin  Severe sepsis without septic shock of unknown source ? UTI vs  Osteomeylitis of the Sacrum -Sepsis criteria: Tachypnea, leukocytosis, lactic acidosis with hypotension that resolved with fluids -Trend lactic acid and is improving from 3.5 -> 3.1 -Procalcitonin is 1.62 -Continue IV fluids; was given a sepsis bolus with 3.5 L of lactated Ringer's and currently on LR at 75 MLS per hour for another 20 hours -WBC went from 19.2/8.9-continues to spike temperatures so we will continue broad-spectrum antibiotics and follow-up on MRI -Chest x-ray showed no acute cardiopulmonary disease -Follow-up on blood cultures and urine culture which are still pending -Broad-spectrum antibiotics as she aided and will likely de-escalate now: Vancomycin/cefepime/metronidazole -CT hematuria work-up as below which will include abdomen/pelvis to evaluate for infectious source -Follow-up on cultures and continue to monitor temperature trend as well as WBC  Hematuria in the setting of chronic Indwelling Foley -Only on aspirin, no anticoagulation -Urology consulted, discussed with Dr. Jeffie Pollock who recommended CT hematuria work-up and will follow up  -CT Hematuria showed "Stranding around the urinary bladder with mild wall thickening suggesting cystitis. Subtle accentuation of enhancement within the walls of the ureters, low-grade ureteritis is difficult to exclude but no focal lesion is identified. This is a catheter in the urinary bladder. 2. Bony destructive findings in the lower sacrum compatible with chronic osteomyelitis. Progressive irregular endplate findings at W2-6 compared to the prior pelvic MRI from 06/01/2020, compatible with discitis-osteomyelitis. There is superimposed bilateral foraminal impingement at L4-5 due to spurring. 3. Other imaging findings of potential clinical significance: Coronary atherosclerosis. Airway thickening in both lower lobes with trace right pleural effusion. Low-grade mesenteric and presacral edema. Scattered colonic diverticulosis. Mild gynecomastia. Peg  tube noted. 4. Aortic atherosclerosis." -Appreciate  Urology Recommendations  Sacral Osteomyelitis/Back Discitis  -CT Hematuria work up showed "Bony destructive findings in the lower sacrum compatible with chronic osteomyelitis. Progressive irregular endplate findings at N4-6 compared to the prior pelvic MRI from 06/01/2020, compatible with discitis-osteomyelitis. There is superimposed bilateral foraminal impingement at L4-5 due to spurring." -MRI Pelvis showed "Decubitus ulcer along the right lower buttock region extends towards the right ischial tuberosity and may extend to the periosteal margin, but there is no abnormal bony enhancement to suggest current right ischial osteomyelitis. 2. Accentuated T2 signal and enhancement in the lower sacrum at about the S4 and S5 level, and potentially of the adjacent coccyx. This is compatible with low-grade chronic osteomyelitis. Thinning and edema of the overlying cutaneous and subcutaneous structures. 3. Mild wall thickening and accentuated enhancement in the urinary bladder suggesting cystitis. 4. Subtle accentuation of enhancement in the distal ureters, particularly the right, possibly reflecting mild ureteritis. 5. Trace free pelvic fluid, origin uncertain. 6. Sigmoid colon diverticulosis." -On Abx As above -Continues to Spike Temperatures so will continue IV Abx currently and de-escalate -Will need to Follow on Cx -If Necessary will consult ID for further evaluation and recommendations -Wound care nurse to follow and C/w Collagenase    Microcytic Anemia, at baseline -Ackley had a dilutional drop on admission as well as having hematuria  -Hemoglobin/hematocrit went from 10.4/34.6 and is now 8.7/20.7 -Check anemia panel in the a.m. -Continue to monitor for signs and symptoms of bleeding; hematuria has improved -Repeat CBC in the a.m.  Insulin-Dependent Diabetes -Checking HbA1c and was 8.2 -Basal bolus dosing with sliding scale -CBGs ranging from  153-164 -Continue with Lantus 15 units subcu daily and NovoLog 4 units 3 times daily with meals -C/w Gabapentin 300 mg per Tube   Hypomagnesemia  -patient magnesium levels 1.1 -Not repeated this morning so we will repeat now and replete if necessary -Continue to Monitor and Trend -Repeat Mag Level in the AM   Hypertension -Holding home antihypertensives as he has had soft BPs here along with Sepsis Physiology  -Last blood pressure is now 126/50  Hyperlipidemia -Continue statin and Icosapent Ethyl 2 gram po BID   History of paraplegia secondary to transverse myelitis with sacral decubitus ulcer, present on arrival -Patient is chronically Bedbound -S suspect his chronic Foley was placed for this reason -Has wound VAC at facility which was removed prior to arrival -WOCN consult -Getting an MRI Pelvis and as above -C/w Tamsulosin 0.4   Depression and Anxiety -C/w Sertaline 75 mg po qHS  GERD -C/w Pantoprazole 40 mg po BID  Bilateral lower extremity chronic wounds -Consulted WOCN and not making any reccs until MRI is done  History of dysphagia s/p G-tube -C/w Tube feeds  Obesity -Complicates overall Prognosis and Care -Estimated body mass index is 32.55 kg/m as calculated from the following:   Height as of this encounter: 6' (1.829 m).   Weight as of this encounter: 108.9 kg. -Weight Loss and Dietary Counseling given   GOC: DNR, poA  DVT prophylaxis: SCDs Code Status: DO NOT RESUSCITATE  Family Communication: No family present at bedside  Disposition Plan: Pending Further clinical improvement   Status is: Inpatient  Remains inpatient appropriate because:Unsafe d/c plan, IV treatments appropriate due to intensity of illness or inability to take PO and Inpatient level of care appropriate due to severity of illness   Dispo: The patient is from: SNF              Anticipated d/c is to: SNF  Patient currently No family present at bedside    Difficult  to place patient No  Consultants:   Urology    Procedures:  CT Hematuria MRI Pelvis  Antimicrobials:  Anti-infectives (From admission, onward)   Start     Dose/Rate Route Frequency Ordered Stop   11/17/20 1000  vancomycin (VANCOREADY) IVPB 1000 mg/200 mL        1,000 mg 200 mL/hr over 60 Minutes Intravenous 2 times daily 11/17/20 0809     11/17/20 0000  vancomycin (VANCOREADY) IVPB 1250 mg/250 mL  Status:  Discontinued        1,250 mg 166.7 mL/hr over 90 Minutes Intravenous 2 times daily 11/16/20 1359 11/17/20 0809   11/16/20 2000  metroNIDAZOLE (FLAGYL) IVPB 500 mg        500 mg 100 mL/hr over 60 Minutes Intravenous Every 8 hours 11/16/20 1333     11/16/20 2000  ceFEPIme (MAXIPIME) 2 g in sodium chloride 0.9 % 100 mL IVPB        2 g 200 mL/hr over 30 Minutes Intravenous Every 8 hours 11/16/20 1340     11/16/20 1345  ceFEPIme (MAXIPIME) 2 g in sodium chloride 0.9 % 100 mL IVPB  Status:  Discontinued        2 g 200 mL/hr over 30 Minutes Intravenous  Once 11/16/20 1333 11/16/20 1334   11/16/20 1345  vancomycin (VANCOCIN) IVPB 1000 mg/200 mL premix  Status:  Discontinued        1,000 mg 200 mL/hr over 60 Minutes Intravenous  Once 11/16/20 1333 11/16/20 1341   11/16/20 1145  vancomycin (VANCOREADY) IVPB 2000 mg/400 mL        2,000 mg 200 mL/hr over 120 Minutes Intravenous  Once 11/16/20 1130 11/16/20 1347   11/16/20 1130  ceFEPIme (MAXIPIME) 2 g in sodium chloride 0.9 % 100 mL IVPB        2 g 200 mL/hr over 30 Minutes Intravenous  Once 11/16/20 1125 11/16/20 1152   11/16/20 1130  metroNIDAZOLE (FLAGYL) IVPB 500 mg        500 mg 100 mL/hr over 60 Minutes Intravenous  Once 11/16/20 1125 11/16/20 1203   11/16/20 1130  vancomycin (VANCOCIN) IVPB 1000 mg/200 mL premix  Status:  Discontinued        1,000 mg 200 mL/hr over 60 Minutes Intravenous  Once 11/16/20 1125 11/16/20 1130        Subjective: Seen and examined at bedside and he was doing okay felt a little bit better today.   Was little anxious about his MRI.  No nausea or vomiting.  Hematuria is improving.  Continues to have some back pain.  No other concerns or complaints this time  Objective: Vitals:   11/17/20 0520 11/17/20 1309 11/17/20 1454 11/17/20 1632  BP: (!) 125/58 120/63 (!) 121/44 (!) 126/50  Pulse: 85 79 86 89  Resp: 20 18 18 18   Temp: 98.1 F (36.7 C) 99.1 F (37.3 C) (!) 102.8 F (39.3 C) 98.7 F (37.1 C)  TempSrc: Oral Oral Oral Oral  SpO2: 96% 99% 94% 94%  Weight:      Height:        Intake/Output Summary (Last 24 hours) at 11/17/2020 1638 Last data filed at 11/17/2020 1531 Gross per 24 hour  Intake 360 ml  Output 6000 ml  Net -5640 ml   Filed Weights   11/16/20 1054  Weight: 108.9 kg   Examination: Physical Exam:  Constitutional: WN/WD obese chronically ill-appearing Caucasian male currently no  acute distress appears slightly anxious though Eyes: Lids and conjunctivae normal, sclerae anicteric  ENMT: External Ears, Nose appear normal. Grossly normal hearing.  Neck: Appears normal, supple, no cervical masses, normal ROM, no appreciable thyromegaly; no appreciable JVD Respiratory: Diminished to auscultation bilaterally, no wheezing, rales, rhonchi or crackles. Normal respiratory effort and patient is not tachypenic. No accessory muscle use.  Unlabored breathing and not wearing supplemental oxygen nasal cannula Cardiovascular: RRR, no murmurs / rubs / gallops. S1 and S2 auscultated.  1+ extremity edema Abdomen: Soft, non-tender, distended secondary body habitus.  Has a PEG tube in place. Bowel sounds positive.  GU: Deferred. Musculoskeletal: No clubbing / cyanosis of digits/nails.  Has atrophy and contractures from his paraplegia Skin: Has a large sacral ulcer.  Skin is warm and dry with no appreciable rashes or lesions Neurologic: CN 2-12 grossly intact with no focal deficits.  Romberg sign and cerebellar reflexes not assessed.  Psychiatric: Normal judgment and insight. Alert and  oriented x 3.  Mildly anxious mood and appropriate affect.   Data Reviewed: I have personally reviewed following labs and imaging studies  CBC: Recent Labs  Lab 11/16/20 1123 11/17/20 0459  WBC 19.2* 8.9  NEUTROABS 15.6*  --   HGB 10.4* 8.7*  HCT 34.6* 28.7*  MCV 79.4* 79.3*  PLT 348 619   Basic Metabolic Panel: Recent Labs  Lab 11/16/20 1123 11/17/20 0459  NA 137 141  K 4.7 3.6  CL 104 110  CO2 23 23  GLUCOSE 219* 162*  BUN 38* 20  CREATININE 0.91 0.39*  CALCIUM 8.9 8.8*  MG 1.1*  --   PHOS 3.6  --    GFR: Estimated Creatinine Clearance: 100.1 mL/min (A) (by C-G formula based on SCr of 0.39 mg/dL (L)). Liver Function Tests: Recent Labs  Lab 11/16/20 1123  AST 25  ALT 15  ALKPHOS 88  BILITOT 0.5  PROT 6.0*  ALBUMIN 2.6*   No results for input(s): LIPASE, AMYLASE in the last 168 hours. No results for input(s): AMMONIA in the last 168 hours. Coagulation Profile: Recent Labs  Lab 11/16/20 1123 11/17/20 0459  INR 1.2 1.2   Cardiac Enzymes: No results for input(s): CKTOTAL, CKMB, CKMBINDEX, TROPONINI in the last 168 hours. BNP (last 3 results) No results for input(s): PROBNP in the last 8760 hours. HbA1C: Recent Labs    11/16/20 1123  HGBA1C 8.2*   CBG: Recent Labs  Lab 11/16/20 1819 11/16/20 2025 11/17/20 0722 11/17/20 1200  GLUCAP 109* 164* 144* 153*   Lipid Profile: No results for input(s): CHOL, HDL, LDLCALC, TRIG, CHOLHDL, LDLDIRECT in the last 72 hours. Thyroid Function Tests: No results for input(s): TSH, T4TOTAL, FREET4, T3FREE, THYROIDAB in the last 72 hours. Anemia Panel: No results for input(s): VITAMINB12, FOLATE, FERRITIN, TIBC, IRON, RETICCTPCT in the last 72 hours. Sepsis Labs: Recent Labs  Lab 11/16/20 1123 11/16/20 1334 11/17/20 0459  PROCALCITON  --   --  1.62  LATICACIDVEN 3.5* 3.1*  --     Recent Results (from the past 240 hour(s))  Blood Culture (routine x 2)     Status: None (Preliminary result)   Collection  Time: 11/16/20 11:23 AM   Specimen: BLOOD  Result Value Ref Range Status   Specimen Description   Final    BLOOD RIGHT ANTECUBITAL Performed at [facility], [place] [address], [place], [place] [address]    Special Requests   Final    BOTTLES DRAWN AEROBIC AND ANAEROBIC Blood Culture adequate volume Performed at  [facility], [place] [address], [place], [place] [address]    Culture   Final    NO GROWTH < 24 HOURS Performed at [place] [address] [place], [place] [address]    Report Status PENDING  Incomplete  Blood Culture (routine x 2)     Status: None (Preliminary result)   Collection Time: 11/16/20 11:40 AM   Specimen: BLOOD RIGHT FOREARM  Result Value Ref Range Status   Specimen Description   Final    BLOOD RIGHT FOREARM Performed at [place] [address] [place], [place] [address]    Special Requests   Final    BOTTLES DRAWN AEROBIC ONLY Blood Culture results may not be optimal due to an inadequate volume of blood received in culture bottles Performed at [place] [address] [place], [place] [address]    Culture   Final    NO GROWTH < 24 HOURS Performed at [place] [address], [place], [place] [address]    Report Status PENDING  Incomplete  Resp Panel by RT-PCR (Flu A&B, Covid) Nasopharyngeal Swab     Status: None   Collection Time: 11/16/20 11:46 AM   Specimen: Nasopharyngeal Swab; Nasopharyngeal(NP) swabs in vial transport medium  Result Value Ref Range Status   SARS Coronavirus 2 by RT PCR NEGATIVE NEGATIVE Final    Comment: (NOTE) SARS-CoV-2 target nucleic acids are NOT DETECTED.  The SARS-CoV-2 RNA is generally detectable in upper respiratory specimens during the acute phase of infection. The lowest concentration of SARS-CoV-2 viral copies this assay can detect is 138 copies/mL. A negative result does not preclude SARS-Cov-2 infection and should  not be used as the sole basis for treatment or other patient management decisions. A negative result may occur with  improper specimen collection/handling, submission of specimen other than nasopharyngeal swab, presence of viral mutation(s) within the areas targeted by this assay, and inadequate number of viral copies(<138 copies/mL). A negative result must be combined with clinical observations, patient history, and epidemiological information. The expected result is Negative.  Fact Sheet for Patients:  EntrepreneurPulse.com.au  Fact Sheet for Healthcare Providers:  IncredibleEmployment.be  This test is no t yet approved or cleared by the [place] FDA and  has been authorized for detection and/or diagnosis of SARS-CoV-2 by FDA under an Emergency Use Authorization (EUA). This EUA will remain  in effect (meaning this test can be used) for the duration of the COVID-19 declaration under Section 564(b)(1) of the Act, 21 U.S.C.section 360bbb-3(b)(1), unless the authorization is terminated  or revoked sooner.       Influenza A by PCR NEGATIVE NEGATIVE Final   Influenza B by PCR NEGATIVE NEGATIVE Final    Comment: (NOTE) The Xpert Xpress SARS-CoV-2/FLU/RSV plus assay is intended as an aid in the diagnosis of influenza from Nasopharyngeal swab specimens and should not be used as a sole basis for treatment. Nasal washings and aspirates are unacceptable for Xpert Xpress SARS-CoV-2/FLU/RSV testing.  Fact Sheet for Patients: EntrepreneurPulse.com.au  Fact Sheet for Healthcare Providers: IncredibleEmployment.be  This test is not yet approved or cleared by the [place] FDA and has been authorized for detection and/or diagnosis of SARS-CoV-2 by FDA under an Emergency Use Authorization (EUA). This EUA will remain in effect (meaning this test can be used) for the duration of the COVID-19 declaration under  Section 564(b)(1) of the Act, 21 U.S.C. section 360bbb-3(b)(1), unless the authorization is terminated or revoked.  Performed at [facility], [place] [address], [place],  [address]      RN Pressure Injury Documentation: Pressure Injury 06/01/20 Sacrum Right;Left;Mid Stage 4 - Full thickness tissue loss with exposed bone, tendon or muscle. Stage 4 Pressure Injury with Tunneling (Active)  06/01/20 1958  Location: Sacrum  Location Orientation: Right;Left;Mid  Staging: Stage 4 - Full thickness tissue loss with exposed bone, tendon or muscle.  Wound Description (Comments): Stage 4 Pressure Injury with Tunneling  Present on Admission: Yes     Pressure Injury 11/16/20 Ankle Posterior;Right Stage 2 -  Partial thickness loss of dermis presenting as a shallow open injury with a red, pink wound bed without slough. (Active)  11/16/20 1530  Location: Ankle  Location Orientation: Posterior;Right  Staging: Stage 2 -  Partial thickness loss of dermis presenting as a shallow open injury with a red, pink wound bed without slough.  Wound Description (Comments):   Present on Admission: Yes     Pressure Injury 11/16/20 Ankle Left;Posterior Stage 1 -  Intact skin with non-blanchable redness of a localized area usually over a bony prominence. (Active)  11/16/20 1530  Location: Ankle  Location Orientation: Left;Posterior  Staging: Stage 1 -  Intact skin with non-blanchable redness of a localized area usually over a bony prominence.  Wound Description (Comments):   Present on Admission: Yes     Pressure Injury 11/16/20 Vertebral column Lower;Mid;Left Stage 4 - Full thickness tissue loss with exposed bone, tendon or muscle. (Active)  11/16/20 1530  Location: Vertebral column  Location Orientation: Lower;Mid;Left  Staging: Stage 4 - Full thickness tissue loss with exposed bone, tendon or muscle.  Wound Description (Comments):   Present on Admission: Yes   Estimated body mass  index is 32.55 kg/m as calculated from the following:   Height as of this encounter: 6' (1.829 m).   Weight as of this encounter: 108.9 kg.  Malnutrition Type:  Nutrition Problem: Increased nutrient needs Etiology: acute illness,wound healing  Malnutrition Characteristics:  Signs/Symptoms: estimated needs   Nutrition Interventions:  Interventions: Tube feeding   Radiology Studies: DG Chest Port 1 View  Result Date: 11/16/2020 CLINICAL DATA:  Sacral wound.  Sepsis EXAM: PORTABLE CHEST 1 VIEW COMPARISON:  Chest radiograph 06/04/2020 FINDINGS: Normal mediastinum and cardiac silhouette. Normal pulmonary vasculature. No evidence of effusion, infiltrate, or pneumothorax. No acute bony abnormality. IMPRESSION: No acute cardiopulmonary process. Electronically Signed   By: Suzy Bouchard M.D.   On: 11/16/2020 12:18   CT HEMATURIA WORKUP  Result Date: 11/16/2020 CLINICAL DATA:  Paraplegia.  Hematuria. EXAM: CT ABDOMEN AND PELVIS WITHOUT AND WITH CONTRAST TECHNIQUE: Multidetector CT imaging of the abdomen and pelvis was performed following the standard protocol before and following the bolus administration of intravenous contrast. CONTRAST:  192mL OMNIPAQUE IOHEXOL 300 MG/ML  SOLN COMPARISON:  Multiple exams, including CT angiogram of 03/08/2020 FINDINGS: Lower chest: Airway thickening in both lower lobes with trace right pleural effusion. Mild gynecomastia. Right coronary artery and descending thoracic aortic atherosclerotic calcification. Dependent subsegmental atelectasis in both lower lobes. Left anterior descending and circumflex coronary artery atherosclerosis. Hepatobiliary: Cholecystectomy.  Otherwise unremarkable. Pancreas: Unremarkable Spleen: Unremarkable Adrenals/Urinary Tract: Is a catheter in the urinary bladder along with a small amount of gas in the urinary bladder. Stranding around the urinary bladder noted with mild wall thickening suggesting cystitis. Layering high density in the  left kidney upper pole cyst likely representing a milk of calcium cyst. Fluid density 3.2 cm left mid kidney cyst noted. Other small  hypodense renal lesions are technically too small to characterize although statistically likely to be benign. Subtle accentuation of enhancement within the walls of the ureters, low-grade ureteritis is difficult to exclude but no focal lesion is identified. Vascular calcifications in the left renal hilum. Stomach/Bowel: Peg tube noted. Scattered colonic diverticulosis. Normal appendix. No dilated bowel. Vascular/Lymphatic: Aortoiliac atherosclerotic vascular disease. No pathologic adenopathy. Reproductive: Unremarkable Other: Small amount of ascites for example image twin the spleen in the stomach. Low-grade mesenteric and presacral edema. Musculoskeletal: Bony destructive findings in the lower sacrum compatible with chronic osteomyelitis. Progressive irregular endplate findings at E3-1 compared to the prior pelvic MRI from 06/01/2020, compatible with discitis-osteomyelitis. There is superimposed bilateral foraminal impingement at the L4-5 level due to spurring. I not see a definite abscess in the psoas muscles. IMPRESSION: 1. Stranding around the urinary bladder with mild wall thickening suggesting cystitis. Subtle accentuation of enhancement within the walls of the ureters, low-grade ureteritis is difficult to exclude but no focal lesion is identified. This is a catheter in the urinary bladder. 2. Bony destructive findings in the lower sacrum compatible with chronic osteomyelitis. Progressive irregular endplate findings at V4-0 compared to the prior pelvic MRI from 06/01/2020, compatible with discitis-osteomyelitis. There is superimposed bilateral foraminal impingement at L4-5 due to spurring. 3. Other imaging findings of potential clinical significance: Coronary atherosclerosis. Airway thickening in both lower lobes with trace right pleural effusion. Low-grade mesenteric and  presacral edema. Scattered colonic diverticulosis. Mild gynecomastia. Peg tube noted. 4. Aortic atherosclerosis. Aortic Atherosclerosis (ICD10-I70.0). Electronically Signed   By: Van Clines M.D.   On: 11/16/2020 17:06   Scheduled Meds: . aspirin EC  81 mg Oral QHS  . atorvastatin  20 mg Oral QHS  . Chlorhexidine Gluconate Cloth  6 each Topical Daily  . cholestyramine light  4 g Oral Q24H  . collagenase   Topical Daily  . feeding supplement (PROSource TF)  45 mL Per Tube Daily  . free water  150 mL Per Tube QID  . gabapentin  300 mg Per Tube QHS  . icosapent Ethyl  2 g Oral BID  . insulin aspart  0-15 Units Subcutaneous TID WC  . insulin aspart  4 Units Subcutaneous TID WC  . insulin glargine  15 Units Subcutaneous QHS  . nutrition supplement (JUVEN)  1 packet Per Tube BID BM  . pantoprazole  40 mg Oral BID  . senna-docusate  1 tablet Oral Daily  . sertraline  75 mg Oral QHS  . sodium chloride flush  3 mL Intravenous Q12H  . tamsulosin  0.4 mg Oral QPC supper  . traZODone  50 mg Oral QHS   Continuous Infusions: . ceFEPime (MAXIPIME) IV 2 g (11/17/20 1449)  . lactated ringers 75 mL/hr at 11/17/20 1143  . metronidazole 500 mg (11/17/20 1213)  . vancomycin 1,000 mg (11/17/20 0867)    LOS: 1 day   Kerney Elbe, DO [facility] PAGER is on [place]  If 7PM-7AM, please contact night-coverage [email or web address]

## 2020-11-17 NOTE — Plan of Care (Signed)

## 2020-11-17 NOTE — Progress Notes (Signed)
New orders placed for Tube feedings and to start at 2000. RN discussed the order with the Pt and Pt's Wife. Pt states "I haven't had tube feedings for about a month. They were making me feel to full and I didn't eat to much food" D[place]ero updated via phone and she will visit Pt and Pt's wife tomorrow and discuss Nutrition.

## 2020-11-17 NOTE — Plan of Care (Signed)
Attempted to get pt twice 6am and 6:30am. Per RN, paged MD twice with no response overnight. Delay in patent care agian. Pt needs meds for claustrophobia. MRI will revisit later today. Night RN to pass on to AM RN the need to get PRN meds for MRI.

## 2020-11-17 NOTE — Progress Notes (Addendum)
Initial Nutrition Assessment  RD working remotely.  DOCUMENTATION CODES:   Obesity unspecified  INTERVENTION:  - will order 1 packet Juven BID, 45 ml Prosource TF once/day, and 150 ml free water QID. - this regimen will provide 230 kcal, 16 grams protein, and 600 ml water/day. - will see patient in person on 3/2.   NUTRITION DIAGNOSIS:   Increased nutrient needs related to acute illness,wound healing as evidenced by estimated needs.  GOAL:   Patient will meet greater than or equal to 90% of their needs  MONITOR:   PO intake,TF tolerance,Labs,Weight trends,Skin  REASON FOR ASSESSMENT:   Consult Enteral/tube feeding initiation and management  ASSESSMENT:   75 year old male with medical history of transverse myelitis s/p rituximab, paraplegia 2/2 transverse myelitis, HTN, HLD, insulin-dependent DM, G-tube placement, CAD, stage IV sacral pressure ulcer with L gluteal abscess and L4-L5 osteomyelitis and R psoas abscess in September and followed with ID at the end of 2021, and s/p diverting loop colostomy. He presented to the ED from [facility] d/t dizziness, generalized weakness, fatigue, low back pain, nausea, and hematuria x2 days. Wound vac in place to sacral wound.  RD working remotely. Patient currently out of the room to CT.  He is on a Heart Healthy/Carb Modified diet and ate 100% of dinner last night (470 kcal and 24 grams protein).  G-tube in place from PTA. Review of outpatient meds shows that patient was receiving 30 ml Prosource Plus orally BID (total of 200 kcal and 30 grams protein) and 1 packet Juven BID per PEG (total of 190 kcal and 5 grams protein). No documentation of TF formula or regimen.   Weight yesterday was documented as 240 lb and appears to be a stated weight. Largely varying weight recordings since 03/2019. Moderate pitting edema to all extremities documented in the edema section of flow sheet.   Page received from RN that patient has not received TF  x1 month d/t overt fullness which led to poor desire to consume items PO.  Per notes: - severe sepsis without septic shock - hematuria with chronic Foley - microcytic anemia - paraplegia - chronic wounds - hx of dysphagia s/p G-tube   Labs reviewed; CBG: 144 mg/dl, creatinine: 0.39 mg/dl. Medications reviewed; sliding scale novolog, 4 units novolog TID, 15 units lantus/day,  IVF; LR @ 75 ml/hr.40 mg oral protonix BID, 1 tablet senokot/day.    NUTRITION - FOCUSED PHYSICAL EXAM:  unable to complete at this time.   Diet Order:   Diet Order            Diet heart healthy/carb modified Room service appropriate? Yes; Fluid consistency: Thin  Diet effective now                 EDUCATION NEEDS:   Not appropriate for education at this time  Skin:  Skin Assessment: Skin Integrity Issues: Skin Integrity Issues:: Other (Comment),Stage IV,Wound VAC,Stage I,Stage II Stage I: L ankle Stage II: R ankle Stage IV: sacrum; lower vertebral column Wound Vac: sacrum Other: non-pressure injury to R tibia; wound to L foot  Last BM:  3/1  Height:   Ht Readings from Last 1 Encounters:  11/16/20 6' (1.829 m)    Weight:   Wt Readings from Last 1 Encounters:  11/16/20 108.9 kg     Estimated Nutritional Needs:  Kcal:  2300-2500 kcal Protein:  120-135 grams Fluid:  >/= 2.5 L/day      Jarome Matin, MS, RD, LDN, CNSC Inpatient Clinical Dietitian RD  pager # available in [place]  After hours/weekend pager # available in [facility]

## 2020-11-17 NOTE — NC FL2 (Signed)
[place] LEVEL OF CARE SCREENING TOOL     IDENTIFICATION  Patient Name: Brent [name]. Birthdate: 02-Feb-1946 Sex: male Admission Date (Current Location): 11/16/2020  [facility] and [place] Number:  Herbalist and Address:  [facility],  [place], [place]      Provider Number: [id number]  Attending Physician Name and Address:  Kerney Elbe, DO  Relative Name and Phone Number:  Veronica Guerrant spouse [phone]    Current Level of Care: Hospital Recommended Level of Care: [place] Prior Approval Number:    Date Approved/Denied:   PASRR Number: [id number]  Discharge Plan: SNF    Current Diagnoses: Patient Active Problem List   Diagnosis Date Noted  . Sepsis ([place]) 11/16/2020  . Hematuria 11/16/2020  . Pressure injury of skin 11/16/2020  . Encounter for monitoring immunomodulating therapy 08/20/2020  . High risk medication use 08/20/2020  . Psoas abscess, right ([place]) 06/05/2020  . Gram-negative bacteremia 06/05/2020  . Malnutrition of moderate degree 06/04/2020  . Lumbar discitis 06/02/2020  . Abscess, gluteal, left 06/02/2020  . Palliative care by specialist   . Goals of care, counseling/discussion   . FTT (failure to thrive) in adult 06/01/2020  . Abdominal pain   . E. coli UTI   . Sacral ulcer, with necrosis of muscle ([place]) 04/09/2020  . 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 Transverse myelitis ([place])   . Labile blood pressure   . Labile blood glucose   . Neurogenic bladder   . Neurogenic bowel   . Acute blood loss anemia   . Transaminitis   . Controlled type 2 diabetes mellitus with hyperglycemia, with long-term current use of insulin ([place])   . Spinal cord infarction ([place]) 03/25/2020  . Quadriplegia and quadriparesis ([place])   . Dyslipidemia   . HCAP (healthcare-associated pneumonia)   . Acute on chronic respiratory failure with hypoxia ([place])   . Acute hypoxemic respiratory failure ([place])    . Quadriplegia ([place])   . Coronary artery disease involving native coronary artery of native heart without angina pectoris   . Steroid-induced hyperglycemia   . Diabetic peripheral neuropathy ([place])   . Tachypnea   . Hyponatremia   . AKI (acute kidney injury) ([place])   . Weakness 03/09/2020  . Chest pain 03/08/2020  . Right leg numbness   . Malignant melanoma of left side of neck ([place]) 10/25/2018  . Facial neuritis 10/25/2018  . Myofascial pain syndrome 10/25/2018  . Occipital neuralgia of left side 10/25/2018  . CAP (community acquired pneumonia) 09/08/2017  . Viral URI with cough 08/02/2017  . Male hypogonadism 06/20/2016  . Renal lesion 06/20/2016  . Insomnia 08/07/2015  . Enlarged prostate with lower urinary tract symptoms (LUTS) 11/07/2014  . Balanitis 07/30/2012  . Bladder neck obstruction 07/30/2012  . Prostate nodule 06/18/2012  . Recurrent nephrolithiasis 06/18/2012  . Urinary urgency 06/18/2012  . Benign neoplasm of colon 08/29/2011  . DEPRESSION, SITUATIONAL, ACUTE 01/23/2010  . Essential hypertension 05/30/2009  . Coronary atherosclerosis 05/30/2009  . Obstructive sleep apnea 11/28/2008  . OBESITY 09/23/2008  . ERECTILE DYSFUNCTION 11/26/2007  . Well controlled type 2 diabetes mellitus with peripheral circulatory disorder ([place]) 05/09/2007  . Hyperlipidemia 05/09/2007  . MYOCARDIAL INFARCTION, HX OF 05/09/2007  . DIVERTICULOSIS, COLON 05/09/2007    Orientation RESPIRATION BLADDER Height & Weight     Self,Time,Situation,Place  Normal Incontinent Weight: 108.9 kg Height:  6' (182.9 cm)  BEHAVIORAL SYMPTOMS/MOOD NEUROLOGICAL BOWEL NUTRITION STATUS  Colostomy Diet (CHO MOD)  AMBULATORY STATUS COMMUNICATION OF NEEDS Skin   Total Care (w/c bound) Verbally PU Stage and Appropriate Care,Wound Vac,Surgical wounds,Other (Comment) (GT surgical site incision;Sacral ulcer-wound vac)     PU Stage 3 Dressing: Daily (wound vac)                 Personal Care  Assistance Level of Assistance  Bathing,Feeding,Dressing Bathing Assistance: Maximum assistance Feeding assistance: Limited assistance Dressing Assistance: Maximum assistance     Functional Limitations Info  Sight,Hearing,Speech Sight Info: Impaired (eyeglasses) Hearing Info: Adequate Speech Info: Adequate    SPECIAL CARE FACTORS FREQUENCY                       Contractures Contractures Info: Not present    Additional Factors Info  Code Status,Allergies,Psychotropic,Insulin Sliding Scale Code Status Info:  (DNR) Allergies Info:  (Levofloxacin;penicillins) Psychotropic Info:  (trazadone) Insulin Sliding Scale Info:  (SSI see MAR)       Current Medications (11/17/2020):  This is the current hospital active medication list Current Facility-Administered Medications  Medication Dose Route Frequency Provider Last Rate Last Admin  . albuterol (PROVENTIL) (2.5 MG/3ML) 0.083% nebulizer solution 2.5 mg  2.5 mg Nebulization Once PRN Harold Hedge, MD      . aspirin EC tablet 81 mg  81 mg Oral QHS Harold Hedge, MD   81 mg at 11/16/20 2326  . atorvastatin (LIPITOR) tablet 20 mg  20 mg Oral QHS Harold Hedge, MD   20 mg at 11/16/20 2325  . ceFEPIme (MAXIPIME) 2 g in sodium chloride 0.9 % 100 mL IVPB  2 g Intravenous Q8H Efraim Kaufmann, RPH 200 mL/hr at 11/17/20 0548 2 g at 11/17/20 0548  . cholestyramine light (PREVALITE) packet 4 g  4 g Oral Q24H Harold Hedge, MD      . collagenase (SANTYL) ointment   Topical Daily Harold Hedge, MD   Given at 11/17/20 [phone]  . gabapentin (NEURONTIN) capsule 300 mg  300 mg Per Tube QHS Harold Hedge, MD   300 mg at 11/16/20 2326  . icosapent Ethyl (VASCEPA) 1 g capsule 2 g  2 g Oral BID Harold Hedge, MD   2 g at 11/17/20 0934  . insulin aspart (novoLOG) injection 0-15 Units  0-15 Units Subcutaneous TID WC Harold Hedge, MD   2 Units at 11/17/20 0759  . insulin aspart (novoLOG) injection 4 Units  4 Units Subcutaneous TID WC Harold Hedge,  MD   4 Units at 11/17/20 0759  . insulin glargine (LANTUS) injection 15 Units  15 Units Subcutaneous QHS Harold Hedge, MD   15 Units at 11/16/20 2330  . lactulose (CHRONULAC) 10 GM/15ML solution 15 g  15 g Oral PRN Harold Hedge, MD      . metroNIDAZOLE (FLAGYL) IVPB 500 mg  500 mg Intravenous Q8H Harold Hedge, MD 100 mL/hr at 11/17/20 0547 500 mg at 11/17/20 0547  . ondansetron (ZOFRAN) tablet 4 mg  4 mg Oral Q6H PRN Harold Hedge, MD       Or  . ondansetron [facility]) injection 4 mg  4 mg Intravenous Q6H PRN Harold Hedge, MD      . pantoprazole (PROTONIX) EC tablet 40 mg  40 mg Oral BID Harold Hedge, MD   40 mg at 11/17/20 0933  . senna-docusate (Senokot-S) tablet 1 tablet  1 tablet Oral Daily Harold Hedge, MD  1 tablet at 11/17/20 0933  . sertraline (ZOLOFT) tablet 75 mg  75 mg Oral QHS Harold Hedge, MD   75 mg at 11/16/20 2326  . sodium chloride flush (NS) 0.9 % injection 3 mL  3 mL Intravenous Q12H Harold Hedge, MD   3 mL at 11/16/20 2332  . tamsulosin (FLOMAX) capsule 0.4 mg  0.4 mg Oral QPC supper Harold Hedge, MD   0.4 mg at 11/16/20 1924  . traMADol (ULTRAM) tablet 50 mg  50 mg Oral Q12H PRN Harold Hedge, MD   50 mg at 11/17/20 0039  . traZODone (DESYREL) tablet 50 mg  50 mg Oral QHS Harold Hedge, MD   50 mg at 11/16/20 2325  . vancomycin (VANCOREADY) IVPB 1000 mg/200 mL  1,000 mg Intravenous BID Lynelle Doctor, [facility]         Discharge Medications: Please see discharge summary for a list of discharge medications.  Relevant Imaging Results:  Relevant Lab Results:   Additional Information SS#: [place]  Jamia Hoban, Juliann Pulse, RN

## 2020-11-17 NOTE — Progress Notes (Signed)
   11/17/20 1454  Assess: MEWS Score  Temp (!) 102.8 F (39.3 C)  BP (!) 121/44  Pulse Rate 86  Resp 18  SpO2 94 %  O2 Device Room Air  Assess: MEWS Score  MEWS Temp 2  MEWS Systolic 0  MEWS Pulse 0  MEWS RR 0  MEWS LOC 0  MEWS Score 2  MEWS Score Color Yellow  Assess: if the MEWS score is Yellow or Red  Were vital signs taken at a resting state? Yes  Focused Assessment No change from prior assessment  Early Detection of Sepsis Score *See Row Information* Medium  MEWS guidelines implemented *See Row Information* Yes  Treat  MEWS Interventions Other (Comment) (MD sent notification awaiting response)  Pain Scale 0-10  Pain Score 0  Take Vital Signs  Increase Vital Sign Frequency  Yellow: Q 2hr X 2 then Q 4hr X 2, if remains yellow, continue Q 4hrs  Escalate  MEWS: Escalate Yellow: discuss with charge nurse/RN and consider discussing with provider and RRT  Notify: Charge Nurse/RN  Name of Charge Nurse/RN Notified Maricela Bo RN  Date Charge Nurse/RN Notified 11/17/20  Time Charge Nurse/RN Notified 1505  Notify: Provider  Provider Name/Title Dr, Alfredia Ferguson  Date Provider Notified 11/17/20  Time Provider Notified 1505  Notification Type Page  Notification Reason Change in status (temp 102.8)  Provider response Other (Comment) (waiting response to notification)

## 2020-11-18 DIAGNOSIS — A419 Sepsis, unspecified organism: Secondary | ICD-10-CM | POA: Diagnosis not present

## 2020-11-18 DIAGNOSIS — I10 Essential (primary) hypertension: Secondary | ICD-10-CM | POA: Diagnosis not present

## 2020-11-18 DIAGNOSIS — R6520 Severe sepsis without septic shock: Secondary | ICD-10-CM | POA: Diagnosis not present

## 2020-11-18 DIAGNOSIS — E1165 Type 2 diabetes mellitus with hyperglycemia: Secondary | ICD-10-CM | POA: Diagnosis not present

## 2020-11-18 LAB — COMPREHENSIVE METABOLIC PANEL
ALT: 13 U/L (ref 0–44)
AST: 18 U/L (ref 15–41)
Albumin: 2.2 g/dL — ABNORMAL LOW (ref 3.5–5.0)
Alkaline Phosphatase: 77 U/L (ref 38–126)
Anion gap: 7 (ref 5–15)
BUN: 11 mg/dL (ref 8–23)
CO2: 23 mmol/L (ref 22–32)
Calcium: 8.6 mg/dL — ABNORMAL LOW (ref 8.9–10.3)
Chloride: 106 mmol/L (ref 98–111)
Creatinine, Ser: <0.3 mg/dL — ABNORMAL LOW (ref 0.61–1.24)
Glucose, Bld: 165 mg/dL — ABNORMAL HIGH (ref 70–99)
Potassium: 3.1 mmol/L — ABNORMAL LOW (ref 3.5–5.1)
Sodium: 136 mmol/L (ref 135–145)
Total Bilirubin: 0.6 mg/dL (ref 0.3–1.2)
Total Protein: 5.2 g/dL — ABNORMAL LOW (ref 6.5–8.1)

## 2020-11-18 LAB — GLUCOSE, CAPILLARY
Glucose-Capillary: 145 mg/dL — ABNORMAL HIGH (ref 70–99)
Glucose-Capillary: 146 mg/dL — ABNORMAL HIGH (ref 70–99)
Glucose-Capillary: 159 mg/dL — ABNORMAL HIGH (ref 70–99)
Glucose-Capillary: 184 mg/dL — ABNORMAL HIGH (ref 70–99)
Glucose-Capillary: 192 mg/dL — ABNORMAL HIGH (ref 70–99)
Glucose-Capillary: 197 mg/dL — ABNORMAL HIGH (ref 70–99)
Glucose-Capillary: 228 mg/dL — ABNORMAL HIGH (ref 70–99)

## 2020-11-18 LAB — CBC WITH DIFFERENTIAL/PLATELET
Abs Immature Granulocytes: 0.59 10*3/uL — ABNORMAL HIGH (ref 0.00–0.07)
Basophils Absolute: 0 10*3/uL (ref 0.0–0.1)
Basophils Relative: 0 %
Eosinophils Absolute: 0.1 10*3/uL (ref 0.0–0.5)
Eosinophils Relative: 1 %
HCT: 27.8 % — ABNORMAL LOW (ref 39.0–52.0)
Hemoglobin: 8.5 g/dL — ABNORMAL LOW (ref 13.0–17.0)
Immature Granulocytes: 7 %
Lymphocytes Relative: 11 %
Lymphs Abs: 0.9 10*3/uL (ref 0.7–4.0)
MCH: 24.1 pg — ABNORMAL LOW (ref 26.0–34.0)
MCHC: 30.6 g/dL (ref 30.0–36.0)
MCV: 79 fL — ABNORMAL LOW (ref 80.0–100.0)
Monocytes Absolute: 0.6 10*3/uL (ref 0.1–1.0)
Monocytes Relative: 7 %
Neutro Abs: 6.1 10*3/uL (ref 1.7–7.7)
Neutrophils Relative %: 74 %
Platelets: 227 10*3/uL (ref 150–400)
RBC: 3.52 MIL/uL — ABNORMAL LOW (ref 4.22–5.81)
RDW: 18.6 % — ABNORMAL HIGH (ref 11.5–15.5)
WBC: 8.2 10*3/uL (ref 4.0–10.5)
nRBC: 0 % (ref 0.0–0.2)

## 2020-11-18 LAB — VITAMIN B12: Vitamin B-12: 489 pg/mL (ref 180–914)

## 2020-11-18 LAB — RETICULOCYTES
Immature Retic Fract: 26.3 % — ABNORMAL HIGH (ref 2.3–15.9)
RBC.: 3.75 MIL/uL — ABNORMAL LOW (ref 4.22–5.81)
Retic Count, Absolute: 78.4 10*3/uL (ref 19.0–186.0)
Retic Ct Pct: 2.1 % (ref 0.4–3.1)

## 2020-11-18 LAB — FOLATE: Folate: 17.1 ng/mL (ref >5.9)

## 2020-11-18 LAB — IRON AND TIBC
Iron: 17 ug/dL — ABNORMAL LOW (ref 45–182)
Saturation Ratios: 8 % — ABNORMAL LOW (ref 17.9–39.5)
TIBC: 204 ug/dL — ABNORMAL LOW (ref 250–450)
UIBC: 187 ug/dL

## 2020-11-18 LAB — C-REACTIVE PROTEIN: CRP: 19.8 mg/dL — ABNORMAL HIGH (ref <1.0)

## 2020-11-18 LAB — SEDIMENTATION RATE: Sed Rate: 90 mm/hr — ABNORMAL HIGH (ref 0–16)

## 2020-11-18 LAB — FERRITIN: Ferritin: 205 ng/mL (ref 24–336)

## 2020-11-18 LAB — MAGNESIUM: Magnesium: 1.3 mg/dL — ABNORMAL LOW (ref 1.7–2.4)

## 2020-11-18 LAB — PHOSPHORUS: Phosphorus: 3.2 mg/dL (ref 2.5–4.6)

## 2020-11-18 MED ORDER — LISINOPRIL 10 MG PO TABS
10.0000 mg | ORAL_TABLET | Freq: Every day | ORAL | Status: DC
  Administered 2020-11-18 – 2020-11-22 (×5): 10 mg via ORAL
  Filled 2020-11-18 (×5): qty 1

## 2020-11-18 MED ORDER — FERROUS SULFATE 300 (60 FE) MG/5ML PO SYRP
300.0000 mg | ORAL_SOLUTION | Freq: Every day | ORAL | Status: DC
  Filled 2020-11-18: qty 5

## 2020-11-18 MED ORDER — MAGNESIUM SULFATE 4 GM/100ML IV SOLN
4.0000 g | Freq: Once | INTRAVENOUS | Status: AC
  Administered 2020-11-18: 4 g via INTRAVENOUS
  Filled 2020-11-18: qty 100

## 2020-11-18 MED ORDER — FERROUS SULFATE 300 (60 FE) MG/5ML PO SYRP
300.0000 mg | ORAL_SOLUTION | Freq: Every day | ORAL | Status: DC
  Administered 2020-11-19 – 2020-11-22 (×4): 300 mg
  Filled 2020-11-18 (×4): qty 5

## 2020-11-18 MED ORDER — POTASSIUM CHLORIDE 20 MEQ PO PACK
40.0000 meq | PACK | Freq: Two times a day (BID) | ORAL | Status: AC
  Administered 2020-11-18 (×2): 40 meq
  Filled 2020-11-18 (×2): qty 2

## 2020-11-18 NOTE — Consult Note (Signed)
[facility] for Infectious Disease       Reason for Consult: probable urinary infection    Referring Physician: Dr. Reesa Chew  Principal Problem:   Sepsis [facility]) Active Problems:   Essential hypertension   Controlled type 2 diabetes mellitus with hyperglycemia, with long-term current use of insulin ([place])   Hematuria   Pressure injury of skin   . aspirin EC  81 mg Oral QHS  . atorvastatin  20 mg Oral QHS  . Chlorhexidine Gluconate Cloth  6 each Topical Daily  . cholestyramine light  4 g Oral Q24H  . collagenase   Topical Daily  . feeding supplement (PROSource TF)  45 mL Per Tube Daily  . free water  150 mL Per Tube QID  . gabapentin  300 mg Per Tube QHS  . icosapent Ethyl  2 g Oral BID  . insulin aspart  0-15 Units Subcutaneous TID WC  . insulin aspart  4 Units Subcutaneous TID WC  . insulin glargine  15 Units Subcutaneous QHS  . nutrition supplement (JUVEN)  1 packet Per Tube BID BM  . pantoprazole  40 mg Oral BID  . potassium chloride  40 mEq Per Tube BID  . senna-docusate  1 tablet Oral Daily  . sertraline  75 mg Oral QHS  . sodium chloride flush  3 mL Intravenous Q12H  . tamsulosin  0.4 mg Oral QPC supper  . traZODone  50 mg Oral QHS    Recommendations:  continue cefepime Stop vancomycin and metronidazole  Assessment: He has hemorrhagic cystitis and positive urinary culture with Proteus with fever to 102 and leukocytosis of 19.2 concerning for urinary infection.  He is improved since admission.  MRI shows cystitis.  Also noted was accentuated T2 signal and enhancement of the sacral area with possible chronic osteomyelitis. ESR and CRP are stable compared to previous.  I do not feel this represents ongoing infection and he has received treatment from previous osteomyelitis.  Therefore, no indication for treatment at this time.    Antibiotics: Vancomycin, cefepime, metronidazole  HPI: Brent [name]. is a 75 y.o. male with a history of transverse myelitis with  lower extremity myelitis found in July 2021, Stage IV pressure ulcer with diverting colostomy recently treated by my partner, Dr. Megan Salon for left gluteal abscess and L4-5 osteomyelitis with Proteus.  He has been on rituximab by Dr. Felecia Shelling.  He was sent in due to hypotension, dizziness, weakness and back pain.  He has noted some hematuria from the foley and MRI c/w mild cystitis of the bladder wall.  Blood cultures have remained negative and urine culture with Proteus, sensitivities pending.  No SOB, no cough, no significant diarrhea. Seen by Dr. Jeffie Pollock of urology and no acute intervention indicated.     Review of Systems:  Constitutional: negative for fevers and chills Gastrointestinal: negative for nausea and diarrhea All other systems reviewed and are negative    Past Medical History:  Diagnosis Date  . Allergy   . CAD (coronary artery disease)    a. angioplasty of his RCA in 1990. b. bare metal stent placed in the RCA in 2000 followed by rotational atherectomy shortly after for stent restenosis. c. last cath was in 2012 showing stable moderate diffuse CAD. (70% mid LAD, 80% diagonal, 70% Ramus, 40% mid to distal RCA stent restenosis). d. Low risk nuc in 2015.  . Diabetes mellitus   . Diverticulosis   . Elevated CK   . Erectile dysfunction   .  Hemorrhoids   . HTN (hypertension)   . Hyperlipidemia   . Hypertriglyceridemia   . Malignant melanoma of left side of neck ([place]) 10/25/2018  . Myocardial infarction ([place])   . Obesity   . OSA (obstructive sleep apnea)   . Persistent disorder of initiating or maintaining sleep   . Personal history of colonic polyps 02/05/2003    Social History   Tobacco Use  . Smoking status: Former Smoker    Packs/day: 1.50    Years: 30.00    Pack years: 45.00    Types: Cigarettes    Quit date: 09/19/1980    Years since quitting: 40.1  . Smokeless tobacco: Never Used  Vaping Use  . Vaping Use: Never used  Substance Use Topics  . Alcohol use: Not  Currently    Alcohol/week: 7.0 - 14.0 standard drinks    Types: 7 - 14 Shots of liquor per week  . Drug use: No    Family History  Problem Relation Age of Onset  . Liver cancer Father   . Lung cancer Father   . Heart disease Father   . Heart disease Sister   . Lung cancer Mother   . COPD Mother   . Obesity Mother   . Colon cancer Neg Hx   . Stomach cancer Neg Hx   . Pancreatic cancer Neg Hx     Allergies  Allergen Reactions  . Levofloxacin Rash  . Penicillins Hives and Rash    Tolerated Unasyn, cefepime and cephalexin    Physical Exam: Constitutional: in no apparent distress  Vitals:   11/18/20 0504 11/18/20 1300  BP: (!) 148/59 (!) 144/56  Pulse: 80 76  Resp: 20   Temp: 99 F (37.2 C) 98.2 F (36.8 C)  SpO2: 95% 98%   EYES: anicteric Cardiovascular: Cor RRR Respiratory: clear; Skin: negatives: no rash   Lab Results  Component Value Date   WBC 8.2 11/18/2020   HGB 8.5 (L) 11/18/2020   HCT 27.8 (L) 11/18/2020   MCV 79.0 (L) 11/18/2020   PLT 227 11/18/2020    Lab Results  Component Value Date   CREATININE <0.30 (L) 11/18/2020   BUN 11 11/18/2020   NA 136 11/18/2020   K 3.1 (L) 11/18/2020   CL 106 11/18/2020   CO2 23 11/18/2020    Lab Results  Component Value Date   ALT 13 11/18/2020   AST 18 11/18/2020   ALKPHOS 77 11/18/2020     Microbiology: Recent Results (from the past 240 hour(s))  Blood Culture (routine x 2)     Status: None (Preliminary result)   Collection Time: 11/16/20 11:23 AM   Specimen: BLOOD  Result Value Ref Range Status   Specimen Description   Final    BLOOD RIGHT ANTECUBITAL Performed at [facility], [place] [address] [place], [place] [address]    Special Requests   Final    BOTTLES DRAWN AEROBIC AND ANAEROBIC Blood Culture adequate volume Performed at [facility] [address], [place], [place] [address]    Culture   Final    NO GROWTH 2 DAYS Performed at [facility] [place] [address] [place], [place] [address]    Report Status PENDING  Incomplete  Blood Culture (routine x 2)     Status: None (Preliminary result)   Collection Time: 11/16/20 11:40 AM   Specimen: BLOOD RIGHT FOREARM  Result Value Ref Range Status   Specimen Description   Final  BLOOD RIGHT FOREARM Performed at [facility] [address], [place], [place] [address]    Special Requests   Final    BOTTLES DRAWN AEROBIC ONLY Blood Culture results may not be optimal due to an inadequate volume of blood received in culture bottles Performed at [facility] [address] [place], [place] [address]    Culture   Final    NO GROWTH 2 DAYS Performed at [facility] [address], [place], [place] [address]    Report Status PENDING  Incomplete  Urine culture     Status: Abnormal (Preliminary result)   Collection Time: 11/16/20 11:40 AM   Specimen: In/Out Cath Urine  Result Value Ref Range Status   Specimen Description   Final    IN/OUT CATH URINE Performed at [facility] [address], [place], [place] [address]    Special Requests   Final    NONE Performed at [facility], [place] [address] [place], [place] [address]    Culture (A)  Final    >=100,000 COLONIES/mL PROTEUS MIRABILIS SUSCEPTIBILITIES TO FOLLOW Performed at [facility], [place] [address], [place], [place] [address]    Report Status PENDING  Incomplete  Resp Panel by RT-PCR (Flu A&B, Covid) Nasopharyngeal Swab     Status: None   Collection Time: 11/16/20 11:46 AM   Specimen: Nasopharyngeal Swab; Nasopharyngeal(NP) swabs in vial transport medium  Result Value Ref Range Status   SARS Coronavirus 2 by RT PCR NEGATIVE NEGATIVE Final    Comment: (NOTE) SARS-CoV-2 target nucleic acids are NOT DETECTED.  The SARS-CoV-2 RNA is generally detectable in upper respiratory specimens during the acute phase of infection. The  lowest concentration of SARS-CoV-2 viral copies this assay can detect is 138 copies/mL. A negative result does not preclude SARS-Cov-2 infection and should not be used as the sole basis for treatment or other patient management decisions. A negative result may occur with  improper specimen collection/handling, submission of specimen other than nasopharyngeal swab, presence of viral mutation(s) within the areas targeted by this assay, and inadequate number of viral copies(<138 copies/mL). A negative result must be combined with clinical observations, patient history, and epidemiological information. The expected result is Negative.  Fact Sheet for Patients:  EntrepreneurPulse.com.au  Fact Sheet for Healthcare Providers:  IncredibleEmployment.be  This test is no t yet approved or cleared by the [place] FDA and  has been authorized for detection and/or diagnosis of SARS-CoV-2 by FDA under an Emergency Use Authorization (EUA). This EUA will remain  in effect (meaning this test can be used) for the duration of the COVID-19 declaration under Section 564(b)(1) of the Act, 21 U.S.C.section 360bbb-3(b)(1), unless the authorization is terminated  or revoked sooner.       Influenza A by PCR NEGATIVE NEGATIVE Final   Influenza B by PCR NEGATIVE NEGATIVE Final    Comment: (NOTE) The Xpert Xpress SARS-CoV-2/FLU/RSV plus assay is intended as an aid in the diagnosis of influenza from Nasopharyngeal swab specimens and should not be used as a sole basis for treatment. Nasal washings and aspirates are unacceptable for Xpert Xpress SARS-CoV-2/FLU/RSV testing.  Fact Sheet for Patients: EntrepreneurPulse.com.au  Fact Sheet for Healthcare Providers: IncredibleEmployment.be  This test is not yet approved or cleared by the [place] FDA and has been authorized for detection and/or diagnosis of SARS-CoV-2 by FDA under  an Emergency Use Authorization (EUA). This EUA will remain in effect (meaning this test  can be used) for the duration of the COVID-19 declaration under Section 564(b)(1) of the Act, 21 U.S.C. section 360bbb-3(b)(1), unless the authorization is terminated or revoked.  Performed at [facility], [place] [address], [place], [place] [address]     Brodan Grewell W Dustee Bottenfield, MD [facility] for Infectious Disease [employer] [email or web address] 11/18/2020, 2:22 PM

## 2020-11-18 NOTE — Progress Notes (Addendum)
PROGRESS NOTE    Brent [name].  IOM:[id number] DOB: 1946/07/26 DOA: 11/16/2020 PCP: [name], [name], MD   Brief Narrative: Taken from H&P and prior notes. This is a 75 year old male with past medical history of transverse myelitis s/p rituximab, paraplegia secondary to transverse myelitis, hypertension, hyperlipidemia, insulin-dependent diabetes, G-tube, CAD, stage IV sacral pressure ulcer with left gluteal abscess and L4-L5 osteomyelitis and right psoas abscess in September and followed with ID at the end of 2021, s/p diverting loop colostomy who presents from [facility] with dizziness, generalized weakness, fatigue,low back pain,nausea and hematuria via Foley catheter x2 days. Currently being treated for sacral wound with wound VAC.  Recently completed course of doxycycline. He was febrile with leukocytosis and hypotensive along with lactic acidosis on arrival.  Patient was also having gross hematuria. Started on sepsis protocol with broad-spectrum antibiotics which include cefepime, vancomycin and Flagyl.  CT with some concern of progression of his chronic osteomyelitis. Urine cultures with Proteus-pending susceptibility.  Blood cultures remain negative.  ESR and CRP seems stable.  ID was consulted today to narrow down his antibiotics, there does not think that he has any progression of his osteomyelitis due to stable ESR and CRP.  Vancomycin and Flagyl were discontinued.  Subjective: Patient was feeling little improved when seen today.  He does not use his PEG tube much, able to tolerate p.o. diet.  Remained afebrile since yesterday.  Assessment & Plan:   Principal Problem:   Sepsis ([place]) Active Problems:   Essential hypertension   Controlled type 2 diabetes mellitus with hyperglycemia, with long-term current use of insulin (HCC)   Hematuria   Pressure injury of skin  Severe sepsis secondary to UTI.  Per ID chronic osteomyelitis seems stable and does not look any  ongoing infection.  Lactic acidosis has been resolved.  Procalcitonin at 1.62. Urine cultures growing Proteus-pending susceptibility.  Blood cultures remain negative. Discontinue vancomycin and Flagyl by ID. -Continue cefepime for now-we will de-escalate once susceptibility results are available.  Gross hematuria.  Patient with chronic indwelling catheter.  Urine started clearing up.  Hemoglobin at 8.5 today. -Monitor hemoglobin -Transfuse if below 7  Chronic decubitus ulcers/chronic osteomyelitis/chronic discitis.  ESR and CRP seems stable.  CRP improving.  Per ID does not look having any acute on chronic infection. Wound care was consulted-appreciate their help-please see their note. -Continue to monitor and continue with wound care.  Microcytic anemia.  Worsening hemoglobin with gross hematuria. -Check anemia panel -Continue to monitor -Transfuse if below 7 -Continue home dose of iron supplement  Hypokalemia/hypomagnesemia. -Replete electrolytes and monitor.  Insulin-dependent diabetes.  A1c at 8.2.  CBG within goal now. -Continue with Lantus at 15 units daily. -Continue with NovoLog 4 units with meals -Continue with sliding scale -Continue with gabapentin for neuropathy  Hypertension.  Blood pressure elevated.  His home dose of antihypertensives were held initially due to softer blood pressure on admission. -Restart home dose of lisinopril  History of depression and anxiety.  No acute concern. -Continue home dose of sertraline  GERD. -Continue Protonix  History of dysphagia s/p G-tube.  Seems improving.  Patient is able to tolerate diet, uses G-tube for medications. -Can be reassessed as an outpatient by PCP for discontinuation of G-tube  Paraplegia secondary to transverse myelitis.  POA.  No new deficit.  Objective: Vitals:   11/17/20 1843 11/17/20 2045 11/18/20 0504 11/18/20 1300  BP: (!) 117/55 (!) 138/53 (!) 148/59 (!) 144/56  Pulse: 80 82 80 76  Resp: _0 Temp: 98.7 F (37.1 C) 98.5 F (36.9 C) 99 F (37.2 C) 98.2 F (36.8 C)  TempSrc: Oral  Oral Oral  SpO2: 96% 98% 95% 98%  Weight:   88 kg   Height:        Intake/Output Summary (Last 24 hours) at 11/18/2020 1550 Last data filed at 11/18/2020 1400 Gross per 24 hour  Intake 4162.96 ml  Output 2225 ml  Net 1937.96 ml   Filed Weights   11/16/20 1054 11/18/20 0504  Weight: 108.9 kg 88 kg    Examination:  General exam: Appears calm and comfortable  Respiratory system: Clear to auscultation. Respiratory effort normal. Cardiovascular system: S1 & S2 heard, RRR. No JVD, murmurs, rubs, gallops or clicks. Gastrointestinal system: Soft, nontender, nondistended, bowel sounds positive. Central nervous system: Alert and oriented.  Paraplegic, decreased bilateral hand grip. Extremities: Trace LE edema, no cyanosis, pulses intact and symmetrical. Skin: Multiple decubitus ulcers. Psychiatry: Judgement and insight appear normal. Mood & affect appropriate.    DVT prophylaxis: SCDs. Code Status: DNR Family Communication: Discussed with patient Disposition Plan:  Status is: Inpatient  Remains inpatient appropriate because:Inpatient level of care appropriate due to severity of illness   Dispo: The patient is from: SNF              Anticipated d/c is to: SNF              Patient currently is not medically stable to d/c.   Difficult to place patient No             Level of care: Progressive  All the records are reviewed and case discussed with Care Management/Social Worker. Management plans discussed with the patient, nursing and they are in agreement.  Consultants:   ID  Procedures:  Antimicrobials:  Cefepime  Data Reviewed: I have personally reviewed following labs and imaging studies  CBC: Recent Labs  Lab 11/16/20 1123 11/17/20 0459 11/18/20 0450  WBC 19.2* 8.9 8.2  NEUTROABS 15.6*  --  6.1  HGB 10.4* 8.7* 8.5*  HCT 34.6* 28.7* 27.8*  MCV 79.4* 79.3* 79.0*  PLT 348  246 409   Basic Metabolic Panel: Recent Labs  Lab 11/16/20 1123 11/17/20 0459 11/18/20 0450  NA 137 141 136  K 4.7 3.6 3.1*  CL 104 110 106  CO2 _1 GLUCOSE 219* 162* 165*  BUN 38* 20 11  CREATININE 0.91 0.39* <0.30*  CALCIUM 8.9 8.8* 8.6*  MG 1.1*  --  1.3*  PHOS 3.6  --  3.2   GFR: CrCl cannot be calculated (This lab value cannot be used to calculate CrCl because it is not a number: <0.30). Liver Function Tests: Recent Labs  Lab 11/16/20 1123 11/18/20 0450  AST 25 18  ALT 15 13  ALKPHOS 88 77  BILITOT 0.5 0.6  PROT 6.0* 5.2*  ALBUMIN 2.6* 2.2*   No results for input(s): LIPASE, AMYLASE in the last 168 hours. No results for input(s): AMMONIA in the last 168 hours. Coagulation Profile: Recent Labs  Lab 11/16/20 1123 11/17/20 0459  INR 1.2 1.2   Cardiac Enzymes: No results for input(s): CKTOTAL, CKMB, CKMBINDEX, TROPONINI in the last 168 hours. BNP (last 3 results) No results for input(s): PROBNP in the last 8760 hours. HbA1C: Recent Labs    11/16/20 1123  HGBA1C 8.2*   CBG: Recent Labs  Lab 11/17/20 2042 11/18/20 0017 11/18/20 0453 11/18/20 0721 11/18/20 1224  GLUCAP 157* 159*  145* 146* 192*   Lipid Profile: No results for input(s): CHOL, HDL, LDLCALC, TRIG, CHOLHDL, LDLDIRECT in the last 72 hours. Thyroid Function Tests: No results for input(s): TSH, T4TOTAL, FREET4, T3FREE, THYROIDAB in the last 72 hours. Anemia Panel: No results for input(s): VITAMINB12, FOLATE, FERRITIN, TIBC, IRON, RETICCTPCT in the last 72 hours. Sepsis Labs: Recent Labs  Lab 11/16/20 1123 11/16/20 1334 11/17/20 0459  PROCALCITON  --   --  1.62  LATICACIDVEN 3.5* 3.1*  --     Recent Results (from the past 240 hour(s))  Blood Culture (routine x 2)     Status: None (Preliminary result)   Collection Time: 11/16/20 11:23 AM   Specimen: BLOOD  Result Value Ref Range Status   Specimen Description   Final    BLOOD RIGHT ANTECUBITAL Performed at [facility], [place] [address] [place], [place] [address]    Special Requests   Final    BOTTLES DRAWN AEROBIC AND ANAEROBIC Blood Culture adequate volume Performed at [facility] [address], [place], [place] [address]    Culture   Final    NO GROWTH 2 DAYS Performed at [facility] [address], [place], [place] [address]    Report Status PENDING  Incomplete  Blood Culture (routine x 2)     Status: None (Preliminary result)   Collection Time: 11/16/20 11:40 AM   Specimen: BLOOD RIGHT FOREARM  Result Value Ref Range Status   Specimen Description   Final    BLOOD RIGHT FOREARM Performed at [facility] [address] [place], [place] [address]    Special Requests   Final    BOTTLES DRAWN AEROBIC ONLY Blood Culture results may not be optimal due to an inadequate volume of blood received in culture bottles Performed at [facility] [address], [place], [place] [address]    Culture   Final    NO GROWTH 2 DAYS Performed at [facility] [address] [place], [place] [address]    Report Status PENDING  Incomplete  Urine culture     Status: Abnormal (Preliminary result)   Collection Time: 11/16/20 11:40 AM   Specimen: In/Out Cath Urine  Result Value Ref Range Status   Specimen Description   Final    IN/OUT CATH URINE Performed at [facility] [address] [place], [place] [address]    Special Requests   Final    NONE Performed at [facility], [place] [address] [place], [place] [address]    Culture (A)  Final    >=100,000 COLONIES/mL PROTEUS MIRABILIS SUSCEPTIBILITIES TO FOLLOW Performed at [facility], [place] [address], [place], [place] [address]    Report Status PENDING  Incomplete  Resp Panel by RT-PCR (Flu A&B, Covid) Nasopharyngeal Swab     Status: None   Collection Time: 11/16/20 11:46 AM   Specimen: Nasopharyngeal Swab;  Nasopharyngeal(NP) swabs in vial transport medium  Result Value Ref Range Status   SARS Coronavirus 2 by RT PCR NEGATIVE NEGATIVE Final    Comment: (NOTE) SARS-CoV-2 target nucleic acids are NOT DETECTED.  The SARS-CoV-2 RNA is generally detectable in upper respiratory specimens during the acute phase of infection. The lowest concentration of SARS-CoV-2 viral copies this assay can detect is 138 copies/mL. A negative result does not preclude SARS-Cov-2 infection and should not be used as the sole basis for treatment or other patient management decisions. A negative  result may occur with  improper specimen collection/handling, submission of specimen other than nasopharyngeal swab, presence of viral mutation(s) within the areas targeted by this assay, and inadequate number of viral copies(<138 copies/mL). A negative result must be combined with clinical observations, patient history, and epidemiological information. The expected result is Negative.  Fact Sheet for Patients:  EntrepreneurPulse.com.au  Fact Sheet for Healthcare Providers:  IncredibleEmployment.be  This test is no t yet approved or cleared by the [place] FDA and  has been authorized for detection and/or diagnosis of SARS-CoV-2 by FDA under an Emergency Use Authorization (EUA). This EUA will remain  in effect (meaning this test can be used) for the duration of the COVID-19 declaration under Section 564(b)(1) of the Act, 21 U.S.C.section 360bbb-3(b)(1), unless the authorization is terminated  or revoked sooner.       Influenza A by PCR NEGATIVE NEGATIVE Final   Influenza B by PCR NEGATIVE NEGATIVE Final    Comment: (NOTE) The Xpert Xpress SARS-CoV-2/FLU/RSV plus assay is intended as an aid in the diagnosis of influenza from Nasopharyngeal swab specimens and should not be used as a sole basis for treatment. Nasal washings and aspirates are unacceptable for Xpert Xpress  SARS-CoV-2/FLU/RSV testing.  Fact Sheet for Patients: EntrepreneurPulse.com.au  Fact Sheet for Healthcare Providers: IncredibleEmployment.be  This test is not yet approved or cleared by the [place] FDA and has been authorized for detection and/or diagnosis of SARS-CoV-2 by FDA under an Emergency Use Authorization (EUA). This EUA will remain in effect (meaning this test can be used) for the duration of the COVID-19 declaration under Section 564(b)(1) of the Act, 21 U.S.C. section 360bbb-3(b)(1), unless the authorization is terminated or revoked.  Performed at [facility], [place] [address], [place], [place] [address]      Radiology Studies: MR PELVIS W WO CONTRAST  Result Date: 11/17/2020 CLINICAL DATA:  Paraplegia and chronic osteomyelitis. EXAM: MRI PELVIS WITHOUT AND WITH CONTRAST TECHNIQUE: Multiplanar multisequence MR imaging of the pelvis was performed both before and after administration of intravenous contrast. CONTRAST:  53m GADAVIST GADOBUTROL 1 MMOL/ML IV SOLN COMPARISON:  CT pelvis 11/16/2020 FINDINGS: Urinary Tract: Foley catheter in the urinary bladder. Mild wall thickening and accentuated enhancement in the urinary bladder suggesting cystitis. Subtle accentuation of enhancement in the distal ureters, particularly the right, possibly reflecting mild ureteritis. Bowel:  Sigmoid colon diverticulosis. Vascular/Lymphatic: Small likely reactive common iliac and external iliac lymph nodes. Reproductive:  Unremarkable Other:  Trace free pelvic fluid, origin uncertain. Musculoskeletal: Mildly accentuated T2 signal and enhancement with thinning of the bony structures in the lower sacrum at about the S4 and S5 level, and potentially of the adjacent coccyx. There is abnormal enhancement and thinning of overlying subcutaneous tissues as shown for example on image 29 of series 10 along with some mild progress presacral enhancement and  edema likely from low-grade chronic osteomyelitis. There is a suggestion of trace right trochanteric bursitis potentially with complex elements given the fairly low T2 signal in this vicinity. Decubitus ulcer along the right lower buttock region extends towards the right ischial tuberosity and may extend to the periosteal margin, but there is no abnormal bony enhancement to suggest current right ischial osteomyelitis. Subcutaneous edema lateral to the hips and upper thigh region, right greater than left. IMPRESSION: 1. Decubitus ulcer along the right lower buttock region extends towards the right ischial tuberosity and may extend to the periosteal margin, but there is no abnormal bony enhancement to suggest current right ischial osteomyelitis.  2. Accentuated T2 signal and enhancement in the lower sacrum at about the S4 and S5 level, and potentially of the adjacent coccyx. This is compatible with low-grade chronic osteomyelitis. Thinning and edema of the overlying cutaneous and subcutaneous structures. 3. Mild wall thickening and accentuated enhancement in the urinary bladder suggesting cystitis. 4. Subtle accentuation of enhancement in the distal ureters, particularly the right, possibly reflecting mild ureteritis. 5. Trace free pelvic fluid, origin uncertain. 6. Sigmoid colon diverticulosis. Electronically Signed   By: Van Clines M.D.   On: 11/17/2020 16:44   CT HEMATURIA WORKUP  Result Date: 11/16/2020 CLINICAL DATA:  Paraplegia.  Hematuria. EXAM: CT ABDOMEN AND PELVIS WITHOUT AND WITH CONTRAST TECHNIQUE: Multidetector CT imaging of the abdomen and pelvis was performed following the standard protocol before and following the bolus administration of intravenous contrast. CONTRAST:  135m OMNIPAQUE IOHEXOL 300 MG/ML  SOLN COMPARISON:  Multiple exams, including CT angiogram of 03/08/2020 FINDINGS: Lower chest: Airway thickening in both lower lobes with trace right pleural effusion. Mild gynecomastia.  Right coronary artery and descending thoracic aortic atherosclerotic calcification. Dependent subsegmental atelectasis in both lower lobes. Left anterior descending and circumflex coronary artery atherosclerosis. Hepatobiliary: Cholecystectomy.  Otherwise unremarkable. Pancreas: Unremarkable Spleen: Unremarkable Adrenals/Urinary Tract: Is a catheter in the urinary bladder along with a small amount of gas in the urinary bladder. Stranding around the urinary bladder noted with mild wall thickening suggesting cystitis. Layering high density in the left kidney upper pole cyst likely representing a milk of calcium cyst. Fluid density 3.2 cm left mid kidney cyst noted. Other small hypodense renal lesions are technically too small to characterize although statistically likely to be benign. Subtle accentuation of enhancement within the walls of the ureters, low-grade ureteritis is difficult to exclude but no focal lesion is identified. Vascular calcifications in the left renal hilum. Stomach/Bowel: Peg tube noted. Scattered colonic diverticulosis. Normal appendix. No dilated bowel. Vascular/Lymphatic: Aortoiliac atherosclerotic vascular disease. No pathologic adenopathy. Reproductive: Unremarkable Other: Small amount of ascites for example image twin the spleen in the stomach. Low-grade mesenteric and presacral edema. Musculoskeletal: Bony destructive findings in the lower sacrum compatible with chronic osteomyelitis. Progressive irregular endplate findings at LC5-8compared to the prior pelvic MRI from 06/01/2020, compatible with discitis-osteomyelitis. There is superimposed bilateral foraminal impingement at the L4-5 level due to spurring. I not see a definite abscess in the psoas muscles. IMPRESSION: 1. Stranding around the urinary bladder with mild wall thickening suggesting cystitis. Subtle accentuation of enhancement within the walls of the ureters, low-grade ureteritis is difficult to exclude but no focal lesion is  identified. This is a catheter in the urinary bladder. 2. Bony destructive findings in the lower sacrum compatible with chronic osteomyelitis. Progressive irregular endplate findings at LN2-7compared to the prior pelvic MRI from 06/01/2020, compatible with discitis-osteomyelitis. There is superimposed bilateral foraminal impingement at L4-5 due to spurring. 3. Other imaging findings of potential clinical significance: Coronary atherosclerosis. Airway thickening in both lower lobes with trace right pleural effusion. Low-grade mesenteric and presacral edema. Scattered colonic diverticulosis. Mild gynecomastia. Peg tube noted. 4. Aortic atherosclerosis. Aortic Atherosclerosis (ICD10-I70.0). Electronically Signed   By: WVan ClinesM.D.   On: 11/16/2020 17:06    Scheduled Meds: . aspirin EC  81 mg Oral QHS  . atorvastatin  20 mg Oral QHS  . Chlorhexidine Gluconate Cloth  6 each Topical Daily  . cholestyramine light  4 g Oral Q24H  . collagenase   Topical Daily  . feeding supplement (PROSource TF)  45  mL Per Tube Daily  . free water  150 mL Per Tube QID  . gabapentin  300 mg Per Tube QHS  . icosapent Ethyl  2 g Oral BID  . insulin aspart  0-15 Units Subcutaneous TID WC  . insulin aspart  4 Units Subcutaneous TID WC  . insulin glargine  15 Units Subcutaneous QHS  . nutrition supplement (JUVEN)  1 packet Per Tube BID BM  . pantoprazole  40 mg Oral BID  . potassium chloride  40 mEq Per Tube BID  . senna-docusate  1 tablet Oral Daily  . sertraline  75 mg Oral QHS  . sodium chloride flush  3 mL Intravenous Q12H  . tamsulosin  0.4 mg Oral QPC supper  . traZODone  50 mg Oral QHS   Continuous Infusions: . ceFEPime (MAXIPIME) IV 2 g (11/18/20 1317)     LOS: 2 days   Time spent: 40 minutes. More than 50% of the time was spent in counseling/coordination of care  Lorella Nimrod, MD [facility]  If 7PM-7AM, please contact night-coverage [email or web address]  11/18/2020, 3:50 PM   This  record has been created using Systems analyst. Errors have been sought and corrected,but may not always be located. Such creation errors do not reflect on the standard of care.

## 2020-11-18 NOTE — Plan of Care (Signed)

## 2020-11-18 NOTE — Progress Notes (Signed)
Nutrition Follow-up  DOCUMENTATION CODES:   Obesity unspecified  INTERVENTION:  - continue Juven BID and 45 ml Prosource TF once/day. - liberalize diet to Regular (approved by MD via secure chat). - encourage wife to bring in foods that patient enjoys.   NUTRITION DIAGNOSIS:   Increased nutrient needs related to acute illness,wound healing as evidenced by estimated needs. -ongoing  GOAL:   Patient will meet greater than or equal to 90% of their needs -unmet  MONITOR:   PO intake,Supplement acceptance,Labs,Weight trends,Skin  ASSESSMENT:   75 year old male with medical history of transverse myelitis s/p rituximab, paraplegia 2/2 transverse myelitis, HTN, HLD, insulin-dependent DM, G-tube placement, CAD, stage IV sacral pressure ulcer with L gluteal abscess and L4-L5 osteomyelitis and R psoas abscess in September and followed with ID at the end of 2021, and s/p diverting loop colostomy. He presented to the ED [facility] d/t dizziness, generalized weakness, fatigue, low back pain, nausea, and hematuria x2 days. Wound vac in place to sacral wound.  Patient consumed 50% of breakfast, 40% of lunch, and 50% of dinner yesterday. This provided a total of 645 kcal (28% kcal need) and 34 grams protein (28% protein need). Juven and Prosource supplements are providing a total of 230 kcal and 16 grams protein.   Flow sheet indicates he ate 100% of breakfast this AM.   Patient laying in bed with no family or visitors present. He reports that he does not care for the food here. He was PEG in place and was previously receiving continuous TF which was then changed to nocturnal and run 1800-0400 each day to encourage PO intakes but this made him so full that he would skip breakfast and often skip lunch too.  About 2 months ago he was put on a 2 week trial of receiving no TF and he was able to eat very well and it was determined that he no longer needed TF to help meet nutrition needs. He was  receiving double protein portions at meals PTA to aid in wound healing and he felt this was going well.   Discussed possibility of diet liberalization and having his wife bring in foods that he does enjoy and can eat larger portions of. Patient is in agreement with this as he desires to stay off of TF.   His appetite remains good he just does not care for the taste of foods here.    Labs reviewed; CBGs: 159, 145, 145 mg/dl, K: 3.1 mmol/l, creatinine: <0.3 mg/dl, Ca: 8.6 mg/dl, Mg: 1.3 mg/dl, Phos WDL. Medications reviewed; sliding scale novolog, 4 units novolog TID, 15 units lantus/day, PRN lactulose, 2 g IV Mg sulfate x1 dose 3/1, 4 g IV Mg sulfate x1 run 3/2, 40 mg oral protonix BID, 40 mEq Klor-Con BID, 1 tablet senokot/day.    NUTRITION - FOCUSED PHYSICAL EXAM:  completed; no muscle or fat depletions, moderate edema to all extremities.  Diet Order:   Diet Order            Diet regular Room service appropriate? Yes; Fluid consistency: Thin  Diet effective now                 EDUCATION NEEDS:   Not appropriate for education at this time  Skin:  Skin Assessment: Skin Integrity Issues: Skin Integrity Issues:: Other (Comment),Stage IV,Wound VAC,Stage I,Stage II Stage I: L ankle Stage II: R ankle Stage IV: sacrum; lower vertebral column Wound Vac: sacrum Other: non-pressure injury to R tibia; wound to L  foot  Last BM:  3/1  Height:   Ht Readings from Last 1 Encounters:  11/16/20 6' (1.829 m)    Weight:   Wt Readings from Last 1 Encounters:  11/18/20 88 kg    Estimated Nutritional Needs:  Kcal:  2300-2500 kcal Protein:  120-135 grams Fluid:  >/= 2.5 L/day      Jarome Matin, MS, RD, LDN, CNSC Inpatient Clinical Dietitian RD pager # available in AMION  After hours/weekend pager # available in [facility]

## 2020-11-18 NOTE — Consult Note (Addendum)
[facility] Nurse wound follow up Wound type:application of VAC therapy to left gluteal abscess, sacral wound and stage 4 right ischium.  Stage 2 pressure injury to  L heel. 1 cm x 1 cm x 0.2 cm red Measurement:see 11/16/20 note. Wound AEW:YBRK and moist, right gluteal wound is 20% slough today, improving.  Santyl applied to slough Drainage (amount, consistency, odor) moderate serosanguinous  No odor.  Periwound:intact  Has LLQ diverting colostomy  Dressing procedure/placement/frequency:All 3 wounds cleansed and intact periwound protected with drape.  Wounds filled with 1 piece black foam and bridged with 1 piece foam.  5 pieces total.  Seal immediately achieved.  Change Mon/Wed/Fri.   Foam dressings to bilateral heels.  Will follow.   Domenic Moras MSN, RN, FNP-BC CWON Wound, Ostomy, Continence Nurse Pager [phone]

## 2020-11-19 DIAGNOSIS — I10 Essential (primary) hypertension: Secondary | ICD-10-CM | POA: Diagnosis not present

## 2020-11-19 DIAGNOSIS — A419 Sepsis, unspecified organism: Secondary | ICD-10-CM | POA: Diagnosis not present

## 2020-11-19 DIAGNOSIS — Z794 Long term (current) use of insulin: Secondary | ICD-10-CM | POA: Diagnosis not present

## 2020-11-19 DIAGNOSIS — E1165 Type 2 diabetes mellitus with hyperglycemia: Secondary | ICD-10-CM | POA: Diagnosis not present

## 2020-11-19 DIAGNOSIS — R6520 Severe sepsis without septic shock: Secondary | ICD-10-CM | POA: Diagnosis not present

## 2020-11-19 LAB — CBC
HCT: 27.4 % — ABNORMAL LOW (ref 39.0–52.0)
Hemoglobin: 8.3 g/dL — ABNORMAL LOW (ref 13.0–17.0)
MCH: 24 pg — ABNORMAL LOW (ref 26.0–34.0)
MCHC: 30.3 g/dL (ref 30.0–36.0)
MCV: 79.2 fL — ABNORMAL LOW (ref 80.0–100.0)
Platelets: 237 10*3/uL (ref 150–400)
RBC: 3.46 MIL/uL — ABNORMAL LOW (ref 4.22–5.81)
RDW: 18.4 % — ABNORMAL HIGH (ref 11.5–15.5)
WBC: 7.7 10*3/uL (ref 4.0–10.5)
nRBC: 0 % (ref 0.0–0.2)

## 2020-11-19 LAB — BASIC METABOLIC PANEL
Anion gap: 7 (ref 5–15)
BUN: 12 mg/dL (ref 8–23)
CO2: 22 mmol/L (ref 22–32)
Calcium: 8.6 mg/dL — ABNORMAL LOW (ref 8.9–10.3)
Chloride: 105 mmol/L (ref 98–111)
Creatinine, Ser: 0.32 mg/dL — ABNORMAL LOW (ref 0.61–1.24)
GFR, Estimated: >60 mL/min (ref >60)
Glucose, Bld: 190 mg/dL — ABNORMAL HIGH (ref 70–99)
Potassium: 3.6 mmol/L (ref 3.5–5.1)
Sodium: 134 mmol/L — ABNORMAL LOW (ref 135–145)

## 2020-11-19 LAB — GLUCOSE, CAPILLARY
Glucose-Capillary: 187 mg/dL — ABNORMAL HIGH (ref 70–99)
Glucose-Capillary: 180 mg/dL — ABNORMAL HIGH (ref 70–99)
Glucose-Capillary: 223 mg/dL — ABNORMAL HIGH (ref 70–99)
Glucose-Capillary: 281 mg/dL — ABNORMAL HIGH (ref 70–99)
Glucose-Capillary: 254 mg/dL — ABNORMAL HIGH (ref 70–99)

## 2020-11-19 LAB — URINE CULTURE: Culture: >=100000 — AB

## 2020-11-19 LAB — MAGNESIUM: Magnesium: 1.5 mg/dL — ABNORMAL LOW (ref 1.7–2.4)

## 2020-11-19 MED ORDER — PROMETHAZINE HCL 25 MG/ML IJ SOLN
12.5000 mg | Freq: Once | INTRAMUSCULAR | Status: AC
  Administered 2020-11-19: 12.5 mg via INTRAVENOUS
  Filled 2020-11-19: qty 1

## 2020-11-19 MED ORDER — SODIUM CHLORIDE 0.9 % IV SOLN
2.0000 g | INTRAVENOUS | Status: DC
  Administered 2020-11-19 – 2020-11-22 (×18): 2 g via INTRAVENOUS
  Filled 2020-11-19 (×2): qty 2
  Filled 2020-11-19 (×2): qty 2000
  Filled 2020-11-19: qty 2
  Filled 2020-11-19: qty 2000
  Filled 2020-11-19 (×4): qty 2
  Filled 2020-11-19: qty 2000
  Filled 2020-11-19: qty 2
  Filled 2020-11-19: qty 2000
  Filled 2020-11-19: qty 2
  Filled 2020-11-19: qty 2000
  Filled 2020-11-19: qty 2
  Filled 2020-11-19 (×2): qty 2000
  Filled 2020-11-19 (×3): qty 2

## 2020-11-19 MED ORDER — MAGNESIUM SULFATE 4 GM/100ML IV SOLN
4.0000 g | Freq: Once | INTRAVENOUS | Status: AC
  Administered 2020-11-19: 4 g via INTRAVENOUS
  Filled 2020-11-19: qty 100

## 2020-11-19 NOTE — Care Management Important Message (Signed)
Important Message  Patient Details IM Letter given to the Patient. Name: Brent [name]. MRN: [id number] Date of Birth: [birth year]   Medicare Important Message Given:  Yes     Kerin Salen 11/19/2020, 8:38 AM

## 2020-11-19 NOTE — Progress Notes (Signed)
°PROGRESS NOTE ° ° ° °Brent F Golab Jr.  MRN:[id number] DOB: 10/01/1945 DOA: 11/16/2020 °PCP: Hernandez Acosta, Estela Y, MD  ° °Brief Narrative: Taken from H&P and prior notes. °This is a 74-year-old male with past medical history of transverse myelitis s/p rituximab, paraplegia secondary to transverse myelitis, hypertension, hyperlipidemia, insulin-dependent diabetes, G-tube, CAD, stage IV sacral pressure ulcer with left gluteal abscess and L4-L5 osteomyelitis and right psoas abscess in September and followed with ID at the end of 2021, s/p diverting loop colostomy who presents from [facility] with dizziness, generalized weakness, fatigue, low back pain, nausea and hematuria via Foley catheter x2 days.  Currently being treated for sacral wound with wound VAC.  Recently completed course of doxycycline. °He was febrile with leukocytosis and hypotensive along with lactic acidosis on arrival.  Patient was also having gross hematuria. °Started on sepsis protocol with broad-spectrum antibiotics which include cefepime, vancomycin and Flagyl.  CT with some concern of progression of his chronic osteomyelitis. °Urine cultures with Proteus-with good susceptibility.  Blood cultures remain negative.  ESR and CRP seems stable.  ID was consulted today to narrow down his antibiotics, there does not think that he has any progression of his osteomyelitis due to stable ESR and CRP.  Vancomycin and Flagyl were discontinued. °Urine cultures with Proteus-good sensitivity, antibiotics switched with amoxicillin per ID. ° °Subjective: °Patient was febrile this morning at 100.7.  He was also complaining of some nausea, no vomiting.  Denies any pain. ° °Assessment & Plan: °  °Principal Problem: °  Sepsis (HCC) °Active Problems: °  Essential hypertension °  Controlled type 2 diabetes mellitus with hyperglycemia, with long-term current use of insulin (HCC) °  Hematuria °  Pressure injury of skin ° °Severe sepsis secondary to UTI.  Per ID  chronic osteomyelitis seems stable and does not look any ongoing infection.  Lactic acidosis has been resolved.  Procalcitonin at 1.62. °Urine cultures growing Proteus-good sensitivity.  Blood cultures remain negative. °Discontinue vancomycin and Flagyl by ID.  Patient did had 1 more episode of being febrile at 100.6 over the last 24-hour. °-Cefepime switched with amoxicillin 500 mg 3 times daily for 10 days total per ID. °-Can be discharged back to his facility tomorrow if remained afebrile for 24-hour. ° °Gross hematuria.  Patient with chronic indwelling catheter.  Urine started clearing up.  Hemoglobin at 8.3 today. °-Monitor hemoglobin °-Transfuse if below 7 ° °Chronic decubitus ulcers/chronic osteomyelitis/chronic discitis.  ESR and CRP seems stable.  CRP improving.  Per ID does not look having any acute on chronic infection. °Wound care was consulted-appreciate their help-please see their note. °-Continue to monitor and continue with wound care. ° °Microcytic anemia. Mild worsening in hemoglobin with gross hematuria, improving now. °-Anemia panel with anemia of chronic disease with iron deficiency. °-Continue to monitor °-Transfuse if below 7 °-Continue home dose of iron supplement ° °Hypokalemia/hypomagnesemia. °-Replete electrolytes and monitor. ° °Insulin-dependent diabetes.  A1c at 8.2.  CBG within goal now. °-Continue with Lantus at 15 units daily. °-Continue with NovoLog 4 units with meals °-Continue with sliding scale °-Continue with gabapentin for neuropathy ° °Hypertension.  Blood pressure within goal.  His home dose of antihypertensives were held initially due to softer blood pressure on admission, restarted yesterday. °-Continue home dose of lisinopril ° °History of depression and anxiety.  No acute concern. °-Continue home dose of sertraline ° °GERD. °-Continue Protonix ° °History of dysphagia s/p G-tube.  Seems improving.  Patient is able to tolerate diet, uses G-tube for medications. °-Can be    reassessed as an outpatient by PCP for discontinuation of G-tube ° °Paraplegia secondary to transverse myelitis.  POA.  No new deficit. ° °Objective: °Vitals:  ° 11/18/20 2349 11/19/20 0351 11/19/20 0500 11/19/20 0740  °BP: (!) 131/51 (!) 123/49  (!) 127/54  °Pulse: 74 79  76  °Resp: 20 (!) 24  16  °Temp: 99.2 °F (37.3 °C) 100 °F (37.8 °C)  (!) 100.7 °F (38.2 °C)  °TempSrc: Oral Oral  Oral  °SpO2: 94% 95%  95%  °Weight:   88 kg   °Height:      ° ° °Intake/Output Summary (Last 24 hours) at 11/19/2020 0816 °Last data filed at 11/19/2020 0748 °Gross per 24 hour  °Intake 3018.71 ml  °Output 2250 ml  °Net 768.71 ml  ° °Filed Weights  ° 11/16/20 1054 11/18/20 0504 11/19/20 0500  °Weight: 108.9 kg 88 kg 88 kg  ° ° °Examination: ° °General.  Well-developed elderly man, in no acute distress. °Pulmonary.  Lungs clear bilaterally, normal respiratory effort. °CV.  Regular rate and rhythm, no JVD, rub or murmur. °Abdomen.  Soft, nontender, nondistended, BS positive.  PEG in place. °CNS.  Alert and oriented x3.  No focal neurologic deficit. °Extremities.  No edema, no cyanosis, pulses intact and symmetrical, multiple wounds with wound VAC. °Psychiatry.  Judgment and insight appears normal. ° °DVT prophylaxis: SCDs. °Code Status: DNR °Family Communication: Discussed with patient °Disposition Plan:  °Status is: Inpatient ° °Remains inpatient appropriate because:Inpatient level of care appropriate due to severity of illness ° ° °Dispo: The patient is from: SNF °             Anticipated d/c is to: SNF °             Patient currently is not medically stable to d/c. °  Difficult to place patient No °            Level of care: Progressive ° °All the records are reviewed and case discussed with Care Management/Social Worker. °Management plans discussed with the patient, nursing and they are in agreement. ° °Consultants:  °· ID ° °Procedures:  °Antimicrobials:  °Amoxicillin ° °Data Reviewed: I have personally reviewed following labs and  imaging studies ° °CBC: °Recent Labs  °Lab 11/16/20 °1123 11/17/20 °0459 11/18/20 °0450 11/19/20 °0459  °WBC 19.2* 8.9 8.2 7.7  °NEUTROABS 15.6*  --  6.1  --   °HGB 10.4* 8.7* 8.5* 8.3*  °HCT 34.6* 28.7* 27.8* 27.4*  °MCV 79.4* 79.3* 79.0* 79.2*  °PLT 348 246 227 237  ° °Basic Metabolic Panel: °Recent Labs  °Lab 11/16/20 °1123 11/17/20 °0459 11/18/20 °0450 11/19/20 °0459  °NA 137 141 136 134*  °K 4.7 3.6 3.1* 3.6  °CL 104 110 106 105  °CO2 23 23 23 22  °GLUCOSE 219* 162* 165* 190*  °BUN 38* 20 11 12  °CREATININE 0.91 0.39* <0.30* 0.32*  °CALCIUM 8.9 8.8* 8.6* 8.6*  °MG 1.1*  --  1.3* 1.5*  °PHOS 3.6  --  3.2  --   ° °GFR: °Estimated Creatinine Clearance: 86.2 mL/min (A) (by C-G formula based on SCr of 0.32 mg/dL (L)). °Liver Function Tests: °Recent Labs  °Lab 11/16/20 °1123 11/18/20 °0450  °AST 25 18  °ALT 15 13  °ALKPHOS 88 77  °BILITOT 0.5 0.6  °PROT 6.0* 5.2*  °ALBUMIN 2.6* 2.2*  ° °No results for input(s): LIPASE, AMYLASE in the last 168 hours. °No results for input(s): AMMONIA in the last 168 hours. °Coagulation Profile: °Recent Labs  °Lab 11/16/20 °  Lab 11/16/20 1123 11/17/20 0459  INR 1.2 1.2   Cardiac Enzymes: No results for input(s): CKTOTAL, CKMB, CKMBINDEX, TROPONINI in the last 168 hours. BNP (last 3 results) No results for input(s): PROBNP in the last 8760 hours. HbA1C: Recent Labs    11/16/20 1123  HGBA1C 8.2*   CBG: Recent Labs  Lab 11/18/20 1753 11/18/20 1941 11/18/20 2351 11/19/20 0354 11/19/20 0736  GLUCAP 197* 184* 228* 187* 180*   Lipid Profile: No results for input(s): CHOL, HDL, LDLCALC, TRIG, CHOLHDL, LDLDIRECT in the last 72 hours. Thyroid Function Tests: No results for input(s): TSH, T4TOTAL, FREET4, T3FREE, THYROIDAB in the last 72 hours. Anemia Panel: Recent Labs    11/18/20 1621  VITAMINB12 489  FOLATE 17.1  FERRITIN 205  TIBC 204*  IRON 17*  RETICCTPCT 2.1   Sepsis Labs: Recent Labs  Lab 11/16/20 1123 11/16/20 1334 11/17/20 0459  PROCALCITON  --   --  1.62   LATICACIDVEN 3.5* 3.1*  --     Recent Results (from the past 240 hour(s))  Blood Culture (routine x 2)     Status: None (Preliminary result)   Collection Time: 11/16/20 11:23 AM   Specimen: BLOOD  Result Value Ref Range Status   Specimen Description   Final    BLOOD RIGHT ANTECUBITAL Performed at [facility], [place] [address], [place], [place] [address]    Special Requests   Final    BOTTLES DRAWN AEROBIC AND ANAEROBIC Blood Culture adequate volume Performed at [facility] [address], [place], [place] [address]    Culture   Final    NO GROWTH 2 DAYS Performed at [facility] [address], [place], [place] [address]    Report Status PENDING  Incomplete  Blood Culture (routine x 2)     Status: None (Preliminary result)   Collection Time: 11/16/20 11:40 AM   Specimen: BLOOD RIGHT FOREARM  Result Value Ref Range Status   Specimen Description   Final    BLOOD RIGHT FOREARM Performed at [facility] [address], [place], [place] [address]    Special Requests   Final    BOTTLES DRAWN AEROBIC ONLY Blood Culture results may not be optimal due to an inadequate volume of blood received in culture bottles Performed at [facility] [address], [place], [place] [address]    Culture   Final    NO GROWTH 2 DAYS Performed at [facility] [address] [place], [place] [address]    Report Status PENDING  Incomplete  Urine culture     Status: Abnormal   Collection Time: 11/16/20 11:40 AM   Specimen: In/Out Cath Urine  Result Value Ref Range Status   Specimen Description   Final    IN/OUT CATH URINE Performed at [facility] [address], [place], [place] [address]    Special Requests   Final    NONE Performed at [facility], [place] [address] [place], [place] [address]    Culture >=100,000 COLONIES/mL PROTEUS MIRABILIS (A)  Final    Report Status 11/19/2020 FINAL  Final   Organism ID, Bacteria PROTEUS MIRABILIS (A)  Final      Susceptibility   Proteus mirabilis - MIC*    AMPICILLIN <=2 SENSITIVE Sensitive     CEFAZOLIN <=4 SENSITIVE Sensitive     CEFEPIME <=0.12 SENSITIVE Sensitive     CEFTRIAXONE <=0.25 SENSITIVE Sensitive  SENSITIVE Sensitive   °  GENTAMICIN <=1 SENSITIVE Sensitive   °  IMIPENEM 2 SENSITIVE Sensitive   °  NITROFURANTOIN 128 RESISTANT Resistant   °  TRIMETH/SULFA >=320 RESISTANT Resistant   °  AMPICILLIN/SULBACTAM <=2 SENSITIVE Sensitive   °  PIP/TAZO <=4 SENSITIVE Sensitive   °  * >=100,000 COLONIES/mL PROTEUS MIRABILIS  °Resp Panel by RT-PCR (Flu A&B, Covid) Nasopharyngeal Swab     Status: None  ° Collection Time: 11/16/20 11:46 AM  ° Specimen: Nasopharyngeal Swab; Nasopharyngeal(NP) swabs in vial transport medium  °Result Value Ref Range Status  ° SARS Coronavirus 2 by RT PCR NEGATIVE NEGATIVE Final  °  Comment: (NOTE) °SARS-CoV-2 target nucleic acids are NOT DETECTED. ° °The SARS-CoV-2 RNA is generally detectable in upper respiratory °specimens during the acute phase of infection. The lowest °concentration of SARS-CoV-2 viral copies this assay can detect is °138 copies/mL. A negative result does not preclude SARS-Cov-2 °infection and should not be used as the sole basis for treatment or °other patient management decisions. A negative result may occur with  °improper specimen collection/handling, submission of specimen other °than nasopharyngeal swab, presence of viral mutation(s) within the °areas targeted by this assay, and inadequate number of viral °copies(<138 copies/mL). A negative result must be combined with °clinical observations, patient history, and epidemiological °information. The expected result is Negative. ° °Fact Sheet for Patients:  °https://www.fda.gov/media/152166/download ° °Fact Sheet for Healthcare Providers:  °https://www.fda.gov/media/152162/download ° °This test is  no t yet approved or cleared by the [place] FDA and  °has been authorized for detection and/or diagnosis of SARS-CoV-2 by °FDA under an Emergency Use Authorization (EUA). This EUA will remain  °in effect (meaning this test can be used) for the duration of the °COVID-19 declaration under Section 564(b)(1) of the Act, 21 °U.S.C.section 360bbb-3(b)(1), unless the authorization is terminated  °or revoked sooner.  ° ° °  ° Influenza A by PCR NEGATIVE NEGATIVE Final  ° Influenza B by PCR NEGATIVE NEGATIVE Final  °  Comment: (NOTE) °The Xpert Xpress SARS-CoV-2/FLU/RSV plus assay is intended as an aid °in the diagnosis of influenza from Nasopharyngeal swab specimens and °should not be used as a sole basis for treatment. Nasal washings and °aspirates are unacceptable for Xpert Xpress SARS-CoV-2/FLU/RSV °testing. ° °Fact Sheet for Patients: °https://www.fda.gov/media/152166/download ° °Fact Sheet for Healthcare Providers: °https://www.fda.gov/media/152162/download ° °This test is not yet approved or cleared by the [place] FDA and °has been authorized for detection and/or diagnosis of SARS-CoV-2 by °FDA under an Emergency Use Authorization (EUA). This EUA will remain °in effect (meaning this test can be used) for the duration of the °COVID-19 declaration under Section 564(b)(1) of the Act, 21 U.S.C. °section 360bbb-3(b)(1), unless the authorization is terminated or °revoked. ° °Performed at [facility], [address] °[place],  [address] °  °  ° °Radiology Studies: °MR PELVIS W WO CONTRAST ° °Result Date: 11/17/2020 °CLINICAL DATA:  Paraplegia and chronic osteomyelitis. EXAM: MRI PELVIS WITHOUT AND WITH CONTRAST TECHNIQUE: Multiplanar multisequence MR imaging of the pelvis was performed both before and after administration of intravenous contrast. CONTRAST:  10mL GADAVIST GADOBUTROL 1 MMOL/ML IV SOLN COMPARISON:  CT pelvis 11/16/2020 FINDINGS: Urinary Tract: Foley catheter in the urinary  bladder. Mild wall thickening and accentuated enhancement in the urinary bladder suggesting cystitis. Subtle accentuation of enhancement in the distal ureters, particularly the right, possibly reflecting mild ureteritis. Bowel:  Sigmoid colon diverticulosis. Vascular/Lymphatic: Small likely reactive common iliac and external iliac lymph nodes. Reproductive:    Unremarkable Other:  Trace free pelvic fluid, origin uncertain. Musculoskeletal: Mildly accentuated T2 signal and enhancement with thinning of the bony structures in the lower sacrum at about the S4 and S5 level, and potentially of the adjacent coccyx. There is abnormal enhancement and thinning of overlying subcutaneous tissues as shown for example on image 29 of series 10 along with some mild progress presacral enhancement and edema likely from low-grade chronic osteomyelitis. There is a suggestion of trace right trochanteric bursitis potentially with complex elements given the fairly low T2 signal in this vicinity. Decubitus ulcer along the right lower buttock region extends towards the right ischial tuberosity and may extend to the periosteal margin, but there is no abnormal bony enhancement to suggest current right ischial osteomyelitis. Subcutaneous edema lateral to the hips and upper thigh region, right greater than left. IMPRESSION: 1. Decubitus ulcer along the right lower buttock region extends towards the right ischial tuberosity and may extend to the periosteal margin, but there is no abnormal bony enhancement to suggest current right ischial osteomyelitis. 2. Accentuated T2 signal and enhancement in the lower sacrum at about the S4 and S5 level, and potentially of the adjacent coccyx. This is compatible with low-grade chronic osteomyelitis. Thinning and edema of the overlying cutaneous and subcutaneous structures. 3. Mild wall thickening and accentuated enhancement in the urinary bladder suggesting cystitis. 4. Subtle accentuation of enhancement in  the distal ureters, particularly the right, possibly reflecting mild ureteritis. 5. Trace free pelvic fluid, origin uncertain. 6. Sigmoid colon diverticulosis. Electronically Signed   By: Walter  Liebkemann M.D.   On: 11/17/2020 16:44  ° ° °Scheduled Meds: °• aspirin EC  81 mg Oral QHS  °• atorvastatin  20 mg Oral QHS  °• Chlorhexidine Gluconate Cloth  6 each Topical Daily  °• cholestyramine light  4 g Oral Q24H  °• collagenase   Topical Daily  °• feeding supplement (PROSource TF)  45 mL Per Tube Daily  °• ferrous sulfate  300 mg Per Tube Daily  °• free water  150 mL Per Tube QID  °• gabapentin  300 mg Per Tube QHS  °• icosapent Ethyl  2 g Oral BID  °• insulin aspart  0-15 Units Subcutaneous TID WC  °• insulin aspart  4 Units Subcutaneous TID WC  °• insulin glargine  15 Units Subcutaneous QHS  °• lisinopril  10 mg Oral Daily  °• nutrition supplement (JUVEN)  1 packet Per Tube BID BM  °• pantoprazole  40 mg Oral BID  °• senna-docusate  1 tablet Oral Daily  °• sertraline  75 mg Oral QHS  °• sodium chloride flush  3 mL Intravenous Q12H  °• tamsulosin  0.4 mg Oral QPC supper  °• traZODone  50 mg Oral QHS  ° °Continuous Infusions: °• ceFEPime (MAXIPIME) IV 2 g (11/19/20 0603)  ° ° ° LOS: 3 days  ° °Time spent: 30 minutes. °More than 50% of the time was spent in counseling/coordination of care ° ° , MD °[facility] ° °If 7PM-7AM, please contact night-coverage °[email or web address] ° °11/19/2020, 8:16 AM  ° °This record has been created using Dragon voice recognition software. Errors have been sought and corrected,but may not always be located. Such creation errors do not reflect on the standard of care.  °

## 2020-11-19 NOTE — Progress Notes (Signed)
[name] for Infectious Disease   Reason for visit: Follow up on urinary infection  Interval History: Proteus sensitivities noted; WBC wnl, fever yesterday to 102.8.    day 4 total antibiotics  Physical Exam: Constitutional:  Vitals:   11/19/20 1156 11/19/20 1333  BP:  (!) 148/64  Pulse:  75  Resp:  16  Temp: 99.3 F (37.4 C) 99 F (37.2 C)  SpO2:  100%   patient appears in NAD Respiratory: Normal respiratory effort; CTA B Cardiovascular: RRR GI: soft, nt, nd  Review of Systems: Constitutional: negative for fevers and chills Gastrointestinal: negative for diarrhea Integument/breast: negative for rash  Lab Results  Component Value Date   WBC 7.7 11/19/2020   HGB 8.3 (L) 11/19/2020   HCT 27.4 (L) 11/19/2020   MCV 79.2 (L) 11/19/2020   PLT 237 11/19/2020    Lab Results  Component Value Date   CREATININE 0.32 (L) 11/19/2020   BUN 12 11/19/2020   NA 134 (L) 11/19/2020   K 3.6 11/19/2020   CL 105 11/19/2020   CO2 22 11/19/2020    Lab Results  Component Value Date   ALT 13 11/18/2020   AST 18 11/18/2020   ALKPHOS 77 11/18/2020     Microbiology: Recent Results (from the past 240 hour(s))  Blood Culture (routine x 2)     Status: None (Preliminary result)   Collection Time: 11/16/20 11:23 AM   Specimen: BLOOD  Result Value Ref Range Status   Specimen Description   Final    BLOOD RIGHT ANTECUBITAL Performed at [facility], [place] [address], [place], [place] [address]    Special Requests   Final    BOTTLES DRAWN AEROBIC AND ANAEROBIC Blood Culture adequate volume Performed at [place] [address] [place], [place] [address]    Culture   Final    NO GROWTH 3 DAYS Performed at [facility], [place] [address], [place], [place] [address]    Report Status PENDING  Incomplete  Blood Culture (routine x 2)     Status: None (Preliminary result)   Collection Time: 11/16/20 11:40 AM   Specimen: BLOOD RIGHT  FOREARM  Result Value Ref Range Status   Specimen Description   Final    BLOOD RIGHT FOREARM Performed at [place] [address], [place], [place] [address]    Special Requests   Final    BOTTLES DRAWN AEROBIC ONLY Blood Culture results may not be optimal due to an inadequate volume of blood received in culture bottles Performed at [place] [address], [place], [place] [address]    Culture   Final    NO GROWTH 3 DAYS Performed at [facility], [place] [address], [place], [place] [address]    Report Status PENDING  Incomplete  Urine culture     Status: Abnormal   Collection Time: 11/16/20 11:40 AM   Specimen: In/Out Cath Urine  Result Value Ref Range Status   Specimen Description   Final    IN/OUT CATH URINE Performed at [place] [address] [place], [place] [address]    Special Requests   Final    NONE Performed at [facility], [place] [address] [place], [place]  [address]    Culture >=100,000 COLONIES/mL PROTEUS MIRABILIS (A)  Final   Report Status 11/19/2020 FINAL  Final   Organism ID, Bacteria PROTEUS MIRABILIS (A)  Final  Susceptibility   Proteus mirabilis - MIC*    AMPICILLIN <=2 SENSITIVE Sensitive     CEFAZOLIN <=4 SENSITIVE Sensitive     CEFEPIME <=0.12 SENSITIVE Sensitive     CEFTRIAXONE <=0.25 SENSITIVE Sensitive     CIPROFLOXACIN <=0.25 SENSITIVE Sensitive     GENTAMICIN <=1 SENSITIVE Sensitive     IMIPENEM 2 SENSITIVE Sensitive     NITROFURANTOIN 128 RESISTANT Resistant     TRIMETH/SULFA >=320 RESISTANT Resistant     AMPICILLIN/SULBACTAM <=2 SENSITIVE Sensitive     PIP/TAZO <=4 SENSITIVE Sensitive     * >=100,000 COLONIES/mL PROTEUS MIRABILIS  Resp Panel by RT-PCR (Flu A&B, Covid) Nasopharyngeal Swab     Status: None   Collection Time: 11/16/20 11:46 AM   Specimen: Nasopharyngeal Swab; Nasopharyngeal(NP) swabs in vial transport medium  Result Value Ref  Range Status   SARS Coronavirus 2 by RT PCR NEGATIVE NEGATIVE Final    Comment: (NOTE) SARS-CoV-2 target nucleic acids are NOT DETECTED.  The SARS-CoV-2 RNA is generally detectable in upper respiratory specimens during the acute phase of infection. The lowest concentration of SARS-CoV-2 viral copies this assay can detect is 138 copies/mL. A negative result does not preclude SARS-Cov-2 infection and should not be used as the sole basis for treatment or other patient management decisions. A negative result may occur with  improper specimen collection/handling, submission of specimen other than nasopharyngeal swab, presence of viral mutation(s) within the areas targeted by this assay, and inadequate number of viral copies(<138 copies/mL). A negative result must be combined with clinical observations, patient history, and epidemiological information. The expected result is Negative.  Fact Sheet for Patients:  EntrepreneurPulse.com.au  Fact Sheet for Healthcare Providers:  IncredibleEmployment.be  This test is no t yet approved or cleared by the [place] FDA and  has been authorized for detection and/or diagnosis of SARS-CoV-2 by FDA under an Emergency Use Authorization (EUA). This EUA will remain  in effect (meaning this test can be used) for the duration of the COVID-19 declaration under Section 564(b)(1) of the Act, 21 U.S.C.section 360bbb-3(b)(1), unless the authorization is terminated  or revoked sooner.       Influenza A by PCR NEGATIVE NEGATIVE Final   Influenza B by PCR NEGATIVE NEGATIVE Final    Comment: (NOTE) The Xpert Xpress SARS-CoV-2/FLU/RSV plus assay is intended as an aid in the diagnosis of influenza from Nasopharyngeal swab specimens and should not be used as a sole basis for treatment. Nasal washings and aspirates are unacceptable for Xpert Xpress SARS-CoV-2/FLU/RSV testing.  Fact Sheet for  Patients: EntrepreneurPulse.com.au  Fact Sheet for Healthcare Providers: IncredibleEmployment.be  This test is not yet approved or cleared by the [place] FDA and has been authorized for detection and/or diagnosis of SARS-CoV-2 by FDA under an Emergency Use Authorization (EUA). This EUA will remain in effect (meaning this test can be used) for the duration of the COVID-19 declaration under Section 564(b)(1) of the Act, 21 U.S.C. section 360bbb-3(b)(1), unless the authorization is terminated or revoked.  Performed at [facility], [place] [address], [place], [place] [address]     Impression/Plan:  1. Probable urinary infection - based on the significant urology history, fever, leukocytosis and urinary culture.  He is on cefepime and Proteus senstivities noted.  It is sensitive to ampicillin and have switched.  He will continue with amoxicillin at discharge 500 mg tid for 10 days total through 3/9.    2.  Chronic decubitus ulcer - VAC in place over left gluteal  area, sacral wound and right ischium.   MRI results noted with accentuated T2 signal and enhancement of the sacrum and possible adjacent coccyx.  CRP is stable and ESR similar as before.  Previous osteomyelitis of L4-5 with abscess, s/p treatment.  I don't suspect this is active chronic osteomyelitis but hard to know overall.  Will need to continue to monitor the wound if it is improving and reassess if not.    3.  Transverse myelitis - followed by Dr. Felecia Shelling and has received rituximab.  He will continue to follow with him as an outpatient.

## 2020-11-19 NOTE — Progress Notes (Signed)
Inpatient Diabetes Program Recommendations  AACE/ADA: New Consensus Statement on Inpatient Glycemic Control (2015)  Target Ranges:  Prepandial:   less than 140 mg/dL      Peak postprandial:   less than 180 mg/dL (1-2 hours)      Critically ill patients:  140 - 180 mg/dL   Lab Results  Component Value Date   GLUCAP 281 (H) 11/19/2020   HGBA1C 8.2 (H) 11/16/2020    Review of Glycemic Control  Diabetes history: DM2  Current orders for Inpatient glycemic control: Lantus 15 units QD, Novolog 0-15 units TID with meals + 4 units TID with meals  HgbA1C - 8.2%  Inpatient Diabetes Program Recommendations:     Increase Lantus to 16 units QHS Increase Novolog to 5 units TID with meals if eating > 50%.  Continue to follow.  Thank you. Lorenda Peck, RD, LDN, CDE Inpatient Diabetes Coordinator [phone]

## 2020-11-20 DIAGNOSIS — G373 Acute transverse myelitis in demyelinating disease of central nervous system: Secondary | ICD-10-CM | POA: Diagnosis not present

## 2020-11-20 DIAGNOSIS — R509 Fever, unspecified: Secondary | ICD-10-CM

## 2020-11-20 DIAGNOSIS — A419 Sepsis, unspecified organism: Secondary | ICD-10-CM | POA: Diagnosis not present

## 2020-11-20 DIAGNOSIS — N390 Urinary tract infection, site not specified: Secondary | ICD-10-CM | POA: Diagnosis not present

## 2020-11-20 DIAGNOSIS — R6520 Severe sepsis without septic shock: Secondary | ICD-10-CM | POA: Diagnosis not present

## 2020-11-20 LAB — CBC WITH DIFFERENTIAL/PLATELET
Abs Immature Granulocytes: 0.58 10*3/uL — ABNORMAL HIGH (ref 0.00–0.07)
Basophils Absolute: 0 10*3/uL (ref 0.0–0.1)
Basophils Relative: 0 %
Eosinophils Absolute: 0 10*3/uL (ref 0.0–0.5)
Eosinophils Relative: 1 %
HCT: 25.8 % — ABNORMAL LOW (ref 39.0–52.0)
Hemoglobin: 7.8 g/dL — ABNORMAL LOW (ref 13.0–17.0)
Immature Granulocytes: 9 %
Lymphocytes Relative: 11 %
Lymphs Abs: 0.7 10*3/uL (ref 0.7–4.0)
MCH: 23.7 pg — ABNORMAL LOW (ref 26.0–34.0)
MCHC: 30.2 g/dL (ref 30.0–36.0)
MCV: 78.4 fL — ABNORMAL LOW (ref 80.0–100.0)
Monocytes Absolute: 0.5 10*3/uL (ref 0.1–1.0)
Monocytes Relative: 8 %
Neutro Abs: 4.4 10*3/uL (ref 1.7–7.7)
Neutrophils Relative %: 71 %
Platelets: 251 10*3/uL (ref 150–400)
RBC: 3.29 MIL/uL — ABNORMAL LOW (ref 4.22–5.81)
RDW: 18.1 % — ABNORMAL HIGH (ref 11.5–15.5)
WBC: 6.2 10*3/uL (ref 4.0–10.5)
nRBC: 0 % (ref 0.0–0.2)

## 2020-11-20 LAB — COMPREHENSIVE METABOLIC PANEL
ALT: 11 U/L (ref 0–44)
AST: 14 U/L — ABNORMAL LOW (ref 15–41)
Albumin: 2 g/dL — ABNORMAL LOW (ref 3.5–5.0)
Alkaline Phosphatase: 79 U/L (ref 38–126)
Anion gap: 9 (ref 5–15)
BUN: 9 mg/dL (ref 8–23)
CO2: 23 mmol/L (ref 22–32)
Calcium: 8.5 mg/dL — ABNORMAL LOW (ref 8.9–10.3)
Chloride: 107 mmol/L (ref 98–111)
Creatinine, Ser: 0.38 mg/dL — ABNORMAL LOW (ref 0.61–1.24)
GFR, Estimated: >60 mL/min (ref >60)
Glucose, Bld: 200 mg/dL — ABNORMAL HIGH (ref 70–99)
Potassium: 3.4 mmol/L — ABNORMAL LOW (ref 3.5–5.1)
Sodium: 139 mmol/L (ref 135–145)
Total Bilirubin: 0.5 mg/dL (ref 0.3–1.2)
Total Protein: 5.3 g/dL — ABNORMAL LOW (ref 6.5–8.1)

## 2020-11-20 LAB — GLUCOSE, CAPILLARY
Glucose-Capillary: 175 mg/dL — ABNORMAL HIGH (ref 70–99)
Glucose-Capillary: 181 mg/dL — ABNORMAL HIGH (ref 70–99)
Glucose-Capillary: 184 mg/dL — ABNORMAL HIGH (ref 70–99)
Glucose-Capillary: 227 mg/dL — ABNORMAL HIGH (ref 70–99)
Glucose-Capillary: 268 mg/dL — ABNORMAL HIGH (ref 70–99)
Glucose-Capillary: 281 mg/dL — ABNORMAL HIGH (ref 70–99)

## 2020-11-20 LAB — MAGNESIUM: Magnesium: 1.5 mg/dL — ABNORMAL LOW (ref 1.7–2.4)

## 2020-11-20 MED ORDER — LIDOCAINE VISCOUS HCL 2 % MT SOLN
5.0000 mL | OROMUCOSAL | Status: DC | PRN
  Administered 2020-11-21: 5 mL via OROMUCOSAL
  Filled 2020-11-20: qty 15

## 2020-11-20 MED ORDER — MAGNESIUM SULFATE 2 GM/50ML IV SOLN
2.0000 g | Freq: Once | INTRAVENOUS | Status: AC
  Administered 2020-11-20: 2 g via INTRAVENOUS
  Filled 2020-11-20: qty 50

## 2020-11-20 MED ORDER — POTASSIUM CHLORIDE IN NACL 20-0.9 MEQ/L-% IV SOLN
INTRAVENOUS | Status: DC
  Filled 2020-11-20 (×6): qty 1000

## 2020-11-20 MED ORDER — LIP MEDEX EX OINT
TOPICAL_OINTMENT | CUTANEOUS | Status: DC | PRN
  Administered 2020-11-21: 1 via TOPICAL
  Filled 2020-11-20: qty 7

## 2020-11-20 NOTE — Progress Notes (Signed)
PROGRESS NOTE    Brent [name].  WUX:[id number] DOB: [birth year] DOA: 11/16/2020 PCP: [name], [name], MD   Brief Narrative: Taken from H&P and prior notes. This is a 75 year old male with past medical history of transverse myelitis s/p rituximab, paraplegia secondary to transverse myelitis, hypertension, hyperlipidemia, insulin-dependent diabetes, G-tube, CAD, stage IV sacral pressure ulcer with left gluteal abscess and L4-L5 osteomyelitis and right psoas abscess in September and followed with ID at the end of 2021, s/p diverting loop colostomy who presents from [facility] with dizziness, generalized weakness, fatigue,low back pain,nausea and hematuria via Foley catheter x2 days. Currently being treated for sacral wound with wound VAC.  Recently completed course of doxycycline. He was febrile with leukocytosis and hypotensive along with lactic acidosis on arrival.  Patient was also having gross hematuria. Started on sepsis protocol with broad-spectrum antibiotics which include cefepime, vancomycin and Flagyl.  CT with some concern of progression of his chronic osteomyelitis. Urine cultures with Proteus-pending susceptibility.  Blood cultures remain negative.  ESR and CRP seems stable.  ID was consulted today to narrow down his antibiotics, there does not think that he has any progression of his osteomyelitis due to stable ESR and CRP.  Vancomycin and Flagyl were discontinued.  11/20/2020: Patient seen alongside patient's nurse.  Patient continues to have fever.  Infectious disease input is highly appreciated.  Subjective: Intermittent fever.  Assessment & Plan:   Principal Problem:   Sepsis ([place]) Active Problems:   Essential hypertension   Controlled type 2 diabetes mellitus with hyperglycemia, with long-term current use of insulin (HCC)   Hematuria   Pressure injury of skin  Severe sepsis secondary to UTI.  Per ID chronic osteomyelitis seems stable and does not look  any ongoing infection.  Lactic acidosis has been resolved.  Procalcitonin at 1.62. Urine cultures growing Proteus-pending susceptibility.  Blood cultures remain negative. Discontinue vancomycin and Flagyl by ID. -Continue cefepime for now-we will de-escalate once susceptibility results are available. 11/20/2020: Infectious disease team is directing antibiotics management.  Gross hematuria.  Patient with chronic indwelling catheter.  Urine started clearing up.  Hemoglobin at 8.5 today. -Monitor hemoglobin -Transfuse if below 7  Chronic decubitus ulcers/chronic osteomyelitis/chronic discitis.  ESR and CRP seems stable.  CRP improving.  Per ID does not look having any acute on chronic infection. Wound care was consulted-appreciate their help-please see their note. -Continue to monitor and continue with wound care.  Microcytic anemia.  Worsening hemoglobin with gross hematuria. -Check anemia panel -Continue to monitor -Transfuse if below 7 -Continue home dose of iron supplement  Hypokalemia/hypomagnesemia. -Replete electrolytes and monitor.  -Placed.  IV magnesium 2 g x 1 dose.  Intravenous fluid normal saline plus KCl.  Insulin-dependent diabetes.  A1c at 8.2.  CBG within goal now. -Continue with Lantus at 15 units daily. -Continue with NovoLog 4 units with meals -Continue with sliding scale -Continue with gabapentin for neuropathy  Hypertension.  Blood pressure elevated.  His home dose of antihypertensives were held initially due to softer blood pressure on admission. -Restart home dose of lisinopril  History of depression and anxiety.  No acute concern. -Continue home dose of sertraline  GERD. -Continue Protonix  History of dysphagia s/p G-tube.  Seems improving.  Patient is able to tolerate diet, uses G-tube for medications. -Can be reassessed as an outpatient by PCP for discontinuation of G-tube  Paraplegia secondary to transverse myelitis.  POA.  No new  deficit.  Objective: Vitals:   11/19/20 2336 11/20/20 0404  11/20/20 0445 11/20/20 1427  BP: (!) 143/59 (!) 145/58  (!) 126/53  Pulse: 79 77  73  Resp: 20 20  18   Temp: 100 F (37.8 C) 98.3 F (36.8 C)  99.7 F (37.6 C)  TempSrc: Oral Oral  Oral  SpO2: 97% 97%  96%  Weight:   88.9 kg   Height:        Intake/Output Summary (Last 24 hours) at 11/20/2020 1653 Last data filed at 11/20/2020 1400 Gross per 24 hour  Intake 854.37 ml  Output 1700 ml  Net -845.63 ml   Filed Weights   11/18/20 0504 11/19/20 0500 11/20/20 0445  Weight: 88 kg 88 kg 88.9 kg    Examination:  General exam: Appears calm and comfortable  Respiratory system: Clear to auscultation.  Cardiovascular system: S1 & S2. Gastrointestinal system: Soft, nontender, nondistended, bowel sounds positive. Central nervous system: Alert and oriented.  Paraplegic Extremities: Bilateral leg edema.     DVT prophylaxis: SCDs. Code Status: DNR Family Communication: Discussed with patient Disposition Plan:  Status is: Inpatient  Remains inpatient appropriate because:Inpatient level of care appropriate due to severity of illness   Dispo: The patient is from: SNF              Anticipated d/c is to: SNF              Patient currently is not medically stable to d/c.   Difficult to place patient No             Level of care: Progressive  All the records are reviewed and case discussed with Care Management/Social Worker. Management plans discussed with the patient, nursing and they are in agreement.  Consultants:   ID  Procedures:  Antimicrobials:  Cefepime  Data Reviewed: I have personally reviewed following labs and imaging studies  CBC: Recent Labs  Lab 11/16/20 1123 11/17/20 0459 11/18/20 0450 11/19/20 0459 11/20/20 0931  WBC 19.2* 8.9 8.2 7.7 6.2  NEUTROABS 15.6*  --  6.1  --  4.4  HGB 10.4* 8.7* 8.5* 8.3* 7.8*  HCT 34.6* 28.7* 27.8* 27.4* 25.8*  MCV 79.4* 79.3* 79.0* 79.2* 78.4*  PLT 348 246 227 237  498   Basic Metabolic Panel: Recent Labs  Lab 11/16/20 1123 11/17/20 0459 11/18/20 0450 11/19/20 0459 11/20/20 0931  NA 137 141 136 134* 139  K 4.7 3.6 3.1* 3.6 3.4*  CL 104 110 106 105 107  CO2 23 23 23 22 23   GLUCOSE 219* 162* 165* 190* 200*  BUN 38* 20 11 12 9   CREATININE 0.91 0.39* <0.30* 0.32* 0.38*  CALCIUM 8.9 8.8* 8.6* 8.6* 8.5*  MG 1.1*  --  1.3* 1.5* 1.5*  PHOS 3.6  --  3.2  --   --    GFR: Estimated Creatinine Clearance: 86.2 mL/min (A) (by C-G formula based on SCr of 0.38 mg/dL (L)). Liver Function Tests: Recent Labs  Lab 11/16/20 1123 11/18/20 0450 11/20/20 0931  AST 25 18 14*  ALT 15 13 11   ALKPHOS 88 77 79  BILITOT 0.5 0.6 0.5  PROT 6.0* 5.2* 5.3*  ALBUMIN 2.6* 2.2* 2.0*   No results for input(s): LIPASE, AMYLASE in the last 168 hours. No results for input(s): AMMONIA in the last 168 hours. Coagulation Profile: Recent Labs  Lab 11/16/20 1123 11/17/20 0459  INR 1.2 1.2   Cardiac Enzymes: No results for input(s): CKTOTAL, CKMB, CKMBINDEX, TROPONINI in the last 168 hours. BNP (last 3 results) No results for input(s): PROBNP in  the last 8760 hours. HbA1C: No results for input(s): HGBA1C in the last 72 hours. CBG: Recent Labs  Lab 11/19/20 2334 11/20/20 0402 11/20/20 0733 11/20/20 1134 11/20/20 1615  GLUCAP 227* 184* 175* 181* 281*   Lipid Profile: No results for input(s): CHOL, HDL, LDLCALC, TRIG, CHOLHDL, LDLDIRECT in the last 72 hours. Thyroid Function Tests: No results for input(s): TSH, T4TOTAL, FREET4, T3FREE, THYROIDAB in the last 72 hours. Anemia Panel: Recent Labs    11/18/20 1621  VITAMINB12 489  FOLATE 17.1  FERRITIN 205  TIBC 204*  IRON 17*  RETICCTPCT 2.1   Sepsis Labs: Recent Labs  Lab 11/16/20 1123 11/16/20 1334 11/17/20 0459  PROCALCITON  --   --  1.62  LATICACIDVEN 3.5* 3.1*  --     Recent Results (from the past 240 hour(s))  Blood Culture (routine x 2)     Status: None (Preliminary result)   Collection  Time: 11/16/20 11:23 AM   Specimen: BLOOD  Result Value Ref Range Status   Specimen Description   Final    BLOOD RIGHT ANTECUBITAL Performed at [facility], [place] [address], [place], [place] [address]    Special Requests   Final    BOTTLES DRAWN AEROBIC AND ANAEROBIC Blood Culture adequate volume Performed at [place] [address] [place], [place] [address]    Culture   Final    NO GROWTH 4 DAYS Performed at [facility], [place] [address], [place], [place] [address]    Report Status PENDING  Incomplete  Blood Culture (routine x 2)     Status: None (Preliminary result)   Collection Time: 11/16/20 11:40 AM   Specimen: BLOOD RIGHT FOREARM  Result Value Ref Range Status   Specimen Description   Final    BLOOD RIGHT FOREARM Performed at [place] [address] [place], [place] [address]    Special Requests   Final    BOTTLES DRAWN AEROBIC ONLY Blood Culture results may not be optimal due to an inadequate volume of blood received in culture bottles Performed at [place] [address], [place], [place] [address]    Culture   Final    NO GROWTH 4 DAYS Performed at [facility], [place] [address] [place], [place] [address]    Report Status PENDING  Incomplete  Urine culture     Status: Abnormal   Collection Time: 11/16/20 11:40 AM   Specimen: In/Out Cath Urine  Result Value Ref Range Status   Specimen Description   Final    IN/OUT CATH URINE Performed at [place] [address], [place], [place] [address]    Special Requests   Final    NONE Performed at [facility], [place] [address] [place], [place] [address]    Culture >=100,000 COLONIES/mL PROTEUS MIRABILIS (A)  Final   Report Status 11/19/2020 FINAL  Final   Organism ID, Bacteria PROTEUS MIRABILIS (A)  Final      Susceptibility   Proteus mirabilis - MIC*    AMPICILLIN <=2  SENSITIVE Sensitive     CEFAZOLIN <=4 SENSITIVE Sensitive     CEFEPIME <=0.12 SENSITIVE Sensitive     CEFTRIAXONE <=0.25 SENSITIVE Sensitive     CIPROFLOXACIN <=0.25 SENSITIVE Sensitive     GENTAMICIN <=1 SENSITIVE Sensitive     IMIPENEM 2 SENSITIVE Sensitive     NITROFURANTOIN 128 RESISTANT Resistant     TRIMETH/SULFA >=320 RESISTANT Resistant  AMPICILLIN/SULBACTAM <=2 SENSITIVE Sensitive     PIP/TAZO <=4 SENSITIVE Sensitive     * >=100,000 COLONIES/mL PROTEUS MIRABILIS  Resp Panel by RT-PCR (Flu A&B, Covid) Nasopharyngeal Swab     Status: None   Collection Time: 11/16/20 11:46 AM   Specimen: Nasopharyngeal Swab; Nasopharyngeal(NP) swabs in vial transport medium  Result Value Ref Range Status   SARS Coronavirus 2 by RT PCR NEGATIVE NEGATIVE Final    Comment: (NOTE) SARS-CoV-2 target nucleic acids are NOT DETECTED.  The SARS-CoV-2 RNA is generally detectable in upper respiratory specimens during the acute phase of infection. The lowest concentration of SARS-CoV-2 viral copies this assay can detect is 138 copies/mL. A negative result does not preclude SARS-Cov-2 infection and should not be used as the sole basis for treatment or other patient management decisions. A negative result may occur with  improper specimen collection/handling, submission of specimen other than nasopharyngeal swab, presence of viral mutation(s) within the areas targeted by this assay, and inadequate number of viral copies(<138 copies/mL). A negative result must be combined with clinical observations, patient history, and epidemiological information. The expected result is Negative.  Fact Sheet for Patients:  EntrepreneurPulse.com.au  Fact Sheet for Healthcare Providers:  IncredibleEmployment.be  This test is no t yet approved or cleared by the [place] FDA and  has been authorized for detection and/or diagnosis of SARS-CoV-2 by FDA under an Emergency Use  Authorization (EUA). This EUA will remain  in effect (meaning this test can be used) for the duration of the COVID-19 declaration under Section 564(b)(1) of the Act, 21 U.S.C.section 360bbb-3(b)(1), unless the authorization is terminated  or revoked sooner.       Influenza A by PCR NEGATIVE NEGATIVE Final   Influenza B by PCR NEGATIVE NEGATIVE Final    Comment: (NOTE) The Xpert Xpress SARS-CoV-2/FLU/RSV plus assay is intended as an aid in the diagnosis of influenza from Nasopharyngeal swab specimens and should not be used as a sole basis for treatment. Nasal washings and aspirates are unacceptable for Xpert Xpress SARS-CoV-2/FLU/RSV testing.  Fact Sheet for Patients: EntrepreneurPulse.com.au  Fact Sheet for Healthcare Providers: IncredibleEmployment.be  This test is not yet approved or cleared by the [place] FDA and has been authorized for detection and/or diagnosis of SARS-CoV-2 by FDA under an Emergency Use Authorization (EUA). This EUA will remain in effect (meaning this test can be used) for the duration of the COVID-19 declaration under Section 564(b)(1) of the Act, 21 U.S.C. section 360bbb-3(b)(1), unless the authorization is terminated or revoked.  Performed at [facility], [place] [address] [place], [place] [address]      Radiology Studies: No results found.  Scheduled Meds: . aspirin EC  81 mg Oral QHS  . atorvastatin  20 mg Oral QHS  . Chlorhexidine Gluconate Cloth  6 each Topical Daily  . cholestyramine light  4 g Oral Q24H  . collagenase   Topical Daily  . feeding supplement (PROSource TF)  45 mL Per Tube Daily  . ferrous sulfate  300 mg Per Tube Daily  . free water  150 mL Per Tube QID  . gabapentin  300 mg Per Tube QHS  . icosapent Ethyl  2 g Oral BID  . insulin aspart  0-15 Units Subcutaneous TID WC  . insulin aspart  4 Units Subcutaneous TID WC  . insulin glargine  15 Units Subcutaneous QHS   . lisinopril  10 mg Oral Daily  . nutrition supplement (JUVEN)  1 packet Per Tube BID BM  .  pantoprazole  40 mg Oral BID  . senna-docusate  1 tablet Oral Daily  . sertraline  75 mg Oral QHS  . sodium chloride flush  3 mL Intravenous Q12H  . tamsulosin  0.4 mg Oral QPC supper  . traZODone  50 mg Oral QHS   Continuous Infusions: . 0.9 % NaCl with KCl 20 mEq / L 75 mL/hr at 11/20/20 0958  . ampicillin (OMNIPEN) IV 2 g (11/20/20 1638)  . magnesium sulfate bolus IVPB       LOS: 4 days   Time spent: 25 minutes. More than 50% of the time was spent in counseling/coordination of care  Bonnell Public, MD [facility]  If 7PM-7AM, please contact night-coverage [email or web address]  11/20/2020, 4:53 PM   This record has been created using Systems analyst. Errors have been sought and corrected,but may not always be located. Such creation errors do not reflect on the standard of care.

## 2020-11-20 NOTE — Consult Note (Signed)
[facility] [place] Nurse wound follow up  Wound type:application of VAC therapy to left gluteal abscess, sacral wound and stage 4 right ischium.   Measurement:see 11/16/20 note. Wound XFQ:HKUV and moist, all wounds- today, improving.   Drainage (amount, consistency, odor) moderate serosanguinous  No odor.  Periwound:intact  Has LLQ diverting colostomy  Dressing procedure/placement/frequency:All 3 wounds cleansed and intact periwound protected with drape.  Wounds filled with 1 piece black foam and bridged with 1 piece foam.  5 pieces total.  Seal immediately achieved.  Change Mon/Wed/Fri Will follow.  Domenic Moras MSN, RN, FNP-BC [facility]ON Wound, Ostomy, Continence Nurse Pager [phone]

## 2020-11-20 NOTE — Progress Notes (Signed)
Brent [name] for Infectious Disease   Reason for visit: Follow up on urinary infection  Interval History: fever overnight again.  No acute events.    day 5 total antibiotics  Physical Exam: Constitutional:  Vitals:   11/20/20 0404 11/20/20 1427  BP: (!) 145/58 (!) 126/53  Pulse: 77 73  Resp: 20 18  Temp: 98.3 F (36.8 C) 99.7 F (37.6 C)  SpO2: 97% 96%   patient appears in NAD Skin: no rashes  Review of Systems: Constitutional: negative for fever Integument/breast: negative for rash  Lab Results  Component Value Date   WBC 6.2 11/20/2020   HGB 7.8 (L) 11/20/2020   HCT 25.8 (L) 11/20/2020   MCV 78.4 (L) 11/20/2020   PLT 251 11/20/2020    Lab Results  Component Value Date   CREATININE 0.38 (L) 11/20/2020   BUN 9 11/20/2020   NA 139 11/20/2020   K 3.4 (L) 11/20/2020   CL 107 11/20/2020   CO2 23 11/20/2020    Lab Results  Component Value Date   ALT 11 11/20/2020   AST 14 (L) 11/20/2020   ALKPHOS 79 11/20/2020     Microbiology: Recent Results (from the past 240 hour(s))  Blood Culture (routine x 2)     Status: None (Preliminary result)   Collection Time: 11/16/20 11:23 AM   Specimen: BLOOD  Result Value Ref Range Status   Specimen Description   Final    BLOOD RIGHT ANTECUBITAL Performed at [facility], [place] [address], [place], [place] [address]    Special Requests   Final    BOTTLES DRAWN AEROBIC AND ANAEROBIC Blood Culture adequate volume Performed at [place] [address], [place], [place] [address]    Culture   Final    NO GROWTH 4 DAYS Performed at [facility], [place] [address], [place], [place] [address]    Report Status PENDING  Incomplete  Blood Culture (routine x 2)     Status: None (Preliminary result)   Collection Time: 11/16/20 11:40 AM   Specimen: BLOOD RIGHT FOREARM  Result Value Ref Range Status   Specimen Description   Final    BLOOD RIGHT FOREARM Performed at [place] [address] [place], [place] [address]    Special Requests   Final    BOTTLES DRAWN AEROBIC ONLY Blood Culture results may not be optimal due to an inadequate volume of blood received in culture bottles Performed at [place] [address] [place], [place] [address]    Culture   Final    NO GROWTH 4 DAYS Performed at [facility], [place] [address] [place], [place] [address]    Report Status PENDING  Incomplete  Urine culture     Status: Abnormal   Collection Time: 11/16/20 11:40 AM   Specimen: In/Out Cath Urine  Result Value Ref Range Status   Specimen Description   Final    IN/OUT CATH URINE Performed at [place] [address], [place], [place] [address]    Special Requests   Final    NONE Performed at [facility], [place] [address], [place], [place] [address]    Culture >=100,000 COLONIES/mL PROTEUS MIRABILIS (A)  Final   Report Status 11/19/2020 FINAL  Final   Organism ID, Bacteria PROTEUS MIRABILIS (A)  Final      Susceptibility   Proteus mirabilis - MIC*    AMPICILLIN <=2 SENSITIVE  Sensitive     CEFAZOLIN <=4 SENSITIVE Sensitive     CEFEPIME <=0.12 SENSITIVE Sensitive     CEFTRIAXONE <=0.25 SENSITIVE Sensitive     CIPROFLOXACIN <=0.25 SENSITIVE Sensitive     GENTAMICIN <=1 SENSITIVE Sensitive     IMIPENEM 2 SENSITIVE Sensitive     NITROFURANTOIN 128 RESISTANT Resistant     TRIMETH/SULFA >=320 RESISTANT Resistant     AMPICILLIN/SULBACTAM <=2 SENSITIVE Sensitive     PIP/TAZO <=4 SENSITIVE Sensitive     * >=100,000 COLONIES/mL PROTEUS MIRABILIS  Resp Panel by RT-PCR (Flu A&B, Covid) Nasopharyngeal Swab     Status: None   Collection Time: 11/16/20 11:46 AM   Specimen: Nasopharyngeal Swab; Nasopharyngeal(NP) swabs in vial transport medium  Result Value Ref Range Status   SARS Coronavirus 2 by RT PCR NEGATIVE NEGATIVE Final    Comment: (NOTE) SARS-CoV-2 target nucleic acids  are NOT DETECTED.  The SARS-CoV-2 RNA is generally detectable in upper respiratory specimens during the acute phase of infection. The lowest concentration of SARS-CoV-2 viral copies this assay can detect is 138 copies/mL. A negative result does not preclude SARS-Cov-2 infection and should not be used as the sole basis for treatment or other patient management decisions. A negative result may occur with  improper specimen collection/handling, submission of specimen other than nasopharyngeal swab, presence of viral mutation(s) within the areas targeted by this assay, and inadequate number of viral copies(<138 copies/mL). A negative result must be combined with clinical observations, patient history, and epidemiological information. The expected result is Negative.  Fact Sheet for Patients:  EntrepreneurPulse.com.au  Fact Sheet for Healthcare Providers:  IncredibleEmployment.be  This test is no t yet approved or cleared by the [place] FDA and  has been authorized for detection and/or diagnosis of SARS-CoV-2 by FDA under an Emergency Use Authorization (EUA). This EUA will remain  in effect (meaning this test can be used) for the duration of the COVID-19 declaration under Section 564(b)(1) of the Act, 21 U.S.C.section 360bbb-3(b)(1), unless the authorization is terminated  or revoked sooner.       Influenza A by PCR NEGATIVE NEGATIVE Final   Influenza B by PCR NEGATIVE NEGATIVE Final    Comment: (NOTE) The Xpert Xpress SARS-CoV-2/FLU/RSV plus assay is intended as an aid in the diagnosis of influenza from Nasopharyngeal swab specimens and should not be used as a sole basis for treatment. Nasal washings and aspirates are unacceptable for Xpert Xpress SARS-CoV-2/FLU/RSV testing.  Fact Sheet for Patients: EntrepreneurPulse.com.au  Fact Sheet for Healthcare Providers: IncredibleEmployment.be  This test is  not yet approved or cleared by the [place] FDA and has been authorized for detection and/or diagnosis of SARS-CoV-2 by FDA under an Emergency Use Authorization (EUA). This EUA will remain in effect (meaning this test can be used) for the duration of the COVID-19 declaration under Section 564(b)(1) of the Act, 21 U.S.C. section 360bbb-3(b)(1), unless the authorization is terminated or revoked.  Performed at [facility], [place] [address], [place], [place] [address]     Impression/Plan:  1. Probable urinary infection - based on the significant urology history, fever, leukocytosis and urinary culture.  He is on cefepime and Proteus senstivities noted.  It is sensitive to ampicillin and have switched.  He will continue with amoxicillin at discharge 500 mg tid for 10 days total through 3/9.   No change in plan, continue with amoxicillin at discharge.    2.  Fever - with ongoing wound and wound VAC, fever not unexpected.  No new concerns so no change in #1.   3.  Transverse myelitis - followed by Dr. Felecia Shelling and has received rituximab.    I am available over the weekend if needed.

## 2020-11-21 ENCOUNTER — Inpatient Hospital Stay (HOSPITAL_COMMUNITY): Payer: Medicare Other

## 2020-11-21 DIAGNOSIS — R319 Hematuria, unspecified: Secondary | ICD-10-CM | POA: Diagnosis not present

## 2020-11-21 DIAGNOSIS — L8995 Pressure ulcer of unspecified site, unstageable: Secondary | ICD-10-CM | POA: Diagnosis not present

## 2020-11-21 DIAGNOSIS — A419 Sepsis, unspecified organism: Secondary | ICD-10-CM | POA: Diagnosis not present

## 2020-11-21 DIAGNOSIS — R6520 Severe sepsis without septic shock: Secondary | ICD-10-CM | POA: Diagnosis not present

## 2020-11-21 LAB — CBC
HCT: 28.3 % — ABNORMAL LOW (ref 39.0–52.0)
Hemoglobin: 8.4 g/dL — ABNORMAL LOW (ref 13.0–17.0)
MCH: 23.4 pg — ABNORMAL LOW (ref 26.0–34.0)
MCHC: 29.7 g/dL — ABNORMAL LOW (ref 30.0–36.0)
MCV: 78.8 fL — ABNORMAL LOW (ref 80.0–100.0)
Platelets: 272 10*3/uL (ref 150–400)
RBC: 3.59 MIL/uL — ABNORMAL LOW (ref 4.22–5.81)
RDW: 18 % — ABNORMAL HIGH (ref 11.5–15.5)
WBC: 5.8 10*3/uL (ref 4.0–10.5)
nRBC: 0 % (ref 0.0–0.2)

## 2020-11-21 LAB — URINALYSIS, ROUTINE W REFLEX MICROSCOPIC
Bacteria, UA: NONE SEEN
Bilirubin Urine: NEGATIVE
Glucose, UA: 50 mg/dL — AB
Ketones, ur: NEGATIVE mg/dL
Nitrite: NEGATIVE
Protein, ur: 100 mg/dL — AB
RBC / HPF: >50 RBC/hpf — ABNORMAL HIGH (ref 0–5)
Specific Gravity, Urine: 1.013 (ref 1.005–1.030)
pH: 6 (ref 5.0–8.0)

## 2020-11-21 LAB — CULTURE, BLOOD (ROUTINE X 2)
Culture: NO GROWTH
Culture: NO GROWTH
Special Requests: ADEQUATE

## 2020-11-21 LAB — GLUCOSE, CAPILLARY
Glucose-Capillary: 167 mg/dL — ABNORMAL HIGH (ref 70–99)
Glucose-Capillary: 169 mg/dL — ABNORMAL HIGH (ref 70–99)
Glucose-Capillary: 178 mg/dL — ABNORMAL HIGH (ref 70–99)
Glucose-Capillary: 206 mg/dL — ABNORMAL HIGH (ref 70–99)
Glucose-Capillary: 217 mg/dL — ABNORMAL HIGH (ref 70–99)
Glucose-Capillary: 286 mg/dL — ABNORMAL HIGH (ref 70–99)

## 2020-11-21 LAB — MRSA PCR SCREENING: MRSA by PCR: NEGATIVE

## 2020-11-21 LAB — SARS CORONAVIRUS 2 (TAT 6-24 HRS): SARS Coronavirus 2: NEGATIVE

## 2020-11-21 NOTE — Progress Notes (Signed)
PROGRESS NOTE    Brent [name].  UUV:[id number] DOB: 09/08/1946 DOA: 11/16/2020 PCP: [name], [name], MD   Brief Narrative: Taken from H&P and prior notes. This is a 75 year old male with past medical history of transverse myelitis s/p rituximab, paraplegia secondary to transverse myelitis, hypertension, hyperlipidemia, insulin-dependent diabetes, G-tube, CAD, stage IV sacral pressure ulcer with left gluteal abscess and L4-L5 osteomyelitis and right psoas abscess in September and followed with ID at the end of 2021, s/p diverting loop colostomy who presents from [facility] with dizziness, generalized weakness, fatigue,low back pain,nausea and hematuria via Foley catheter x2 days. Currently being treated for sacral wound with wound VAC.  Recently completed course of doxycycline. He was febrile with leukocytosis and hypotensive along with lactic acidosis on arrival.  Patient was also having gross hematuria. Started on sepsis protocol with broad-spectrum antibiotics which include cefepime, vancomycin and Flagyl.  CT with some concern of progression of his chronic osteomyelitis. Urine cultures with Proteus-pending susceptibility.  Blood cultures remain negative.  ESR and CRP seems stable.  ID was consulted today to narrow down his antibiotics, there does not think that he has any progression of his osteomyelitis due to stable ESR and CRP.  Vancomycin and Flagyl were discontinued.  11/21/2020: Patient seen alongside patient's nurse.  Patient continues to have hematuria.  Urology team is managing the hematuria.  ID input is appreciated.  Reports nausea.  No documented fever.  Subjective: Nausea. Intermittent hematuria. Induration around the mid abdominal area.  Assessment & Plan:   Principal Problem:   Sepsis ([place]) Active Problems:   Essential hypertension   Controlled type 2 diabetes mellitus with hyperglycemia, with long-term current use of insulin (HCC)   Hematuria    Pressure injury of skin  Severe sepsis secondary to UTI.  Per ID chronic osteomyelitis seems stable and does not look any ongoing infection.  Lactic acidosis has been resolved.  Procalcitonin at 1.62. Urine cultures growing Proteus-pending susceptibility.  Blood cultures remain negative. Discontinue vancomycin and Flagyl by ID. -Continue cefepime for now-we will de-escalate once susceptibility results are available. 11/21/2020: Infectious disease team is directing antibiotics management.  Gross hematuria- -Patient with chronic indwelling catheter.  Urine started clearing up.  Hemoglobin at 8.5 today. -Monitor hemoglobin -Transfuse if below 7 11/21/2020: Patient seems to have intermittent hematuria.  Hematuria has started again.  Will check CBC, urinalysis and if indicated urine culture.  Patient was seen by urology team earlier, and outpatient follow for possible cystoscopy by the urology team was advised.  Complete course of antibiotics.  Chronic decubitus ulcers/chronic osteomyelitis/chronic discitis.  ESR and CRP seems stable.  CRP improving.  Per ID does not look having any acute on chronic infection. Wound care was consulted-appreciate their help-please see their note. -Continue to monitor and continue with wound care.  Microcytic anemia.  Worsening hemoglobin with gross hematuria. -Check anemia panel -Continue to monitor -Transfuse if below 7 -Continue home dose of iron supplement 11/21/2020: Hemoglobin today 8.4 g/dL.  Hypokalemia/hypomagnesemia. -Replete electrolytes and monitor.  -Placed.  IV magnesium 2 g x 1 dose.  Intravenous fluid normal saline plus KCl. 11/21/2020: Continue to monitor closely.  Repeat BMP and magnesium in the morning.  Insulin-dependent diabetes.  A1c at 8.2.  CBG within goal now. -Continue with Lantus at 15 units daily. -Continue with NovoLog 4 units with meals -Continue with sliding scale -Continue with gabapentin for neuropathy  Hypertension.  Blood  pressure elevated.  His home dose of antihypertensives were held initially due  to softer blood pressure on admission. -Restart home dose of lisinopril  History of depression and anxiety.  No acute concern. -Continue home dose of sertraline  GERD. -Continue Protonix  History of dysphagia s/p G-tube.  Seems improving.  Patient is able to tolerate diet, uses G-tube for medications. -Can be reassessed as an outpatient by PCP for discontinuation of G-tube  Paraplegia secondary to transverse myelitis.  POA.  No new deficit.  Objective: Vitals:   11/20/20 1950 11/21/20 0404 11/21/20 1218 11/21/20 1226  BP: (!) 155/64 (!) 126/57 (!) 146/56 (!) 146/56  Pulse: 79 68  75  Resp: _0 Temp: 99.1 F (37.3 C) 98.6 F (37 C)  99 F (37.2 C)  TempSrc: Oral Oral  Oral  SpO2: 97% 96%  98%  Weight:      Height:        Intake/Output Summary (Last 24 hours) at 11/21/2020 1405 Last data filed at 11/21/2020 1320 Gross per 24 hour  Intake 1831.96 ml  Output 3602 ml  Net -1770.04 ml   Filed Weights   11/18/20 0504 11/19/20 0500 11/20/20 0445  Weight: 88 kg 88 kg 88.9 kg    Examination:  General exam: Appears calm and comfortable  Respiratory system: Clear to auscultation.  Cardiovascular system: S1 & S2. Gastrointestinal system: Soft, nontender, nondistended, bowel sounds positive. Central nervous system: Alert and oriented.  Paraplegic Extremities: Bilateral leg edema.     DVT prophylaxis: SCDs. Code Status: DNR Family Communication: Discussed with patient Disposition Plan:  Status is: Inpatient  Remains inpatient appropriate because:Inpatient level of care appropriate due to severity of illness   Dispo: The patient is from: SNF              Anticipated d/c is to: SNF              Patient currently is not medically stable to d/c.   Difficult to place patient No             Level of care: Progressive  All the records are reviewed and case discussed with Care  Management/Social Worker. Management plans discussed with the patient, nursing and they are in agreement.  Consultants:   ID  Procedures:  Antimicrobials:  Cefepime  Data Reviewed: I have personally reviewed following labs and imaging studies  CBC: Recent Labs  Lab 11/16/20 1123 11/17/20 0459 11/18/20 0450 11/19/20 0459 11/20/20 0931 11/21/20 1111  WBC 19.2* 8.9 8.2 7.7 6.2 5.8  NEUTROABS 15.6*  --  6.1  --  4.4  --   HGB 10.4* 8.7* 8.5* 8.3* 7.8* 8.4*  HCT 34.6* 28.7* 27.8* 27.4* 25.8* 28.3*  MCV 79.4* 79.3* 79.0* 79.2* 78.4* 78.8*  PLT 348 246 227 237 251 841   Basic Metabolic Panel: Recent Labs  Lab 11/16/20 1123 11/17/20 0459 11/18/20 0450 11/19/20 0459 11/20/20 0931  NA 137 141 136 134* 139  K 4.7 3.6 3.1* 3.6 3.4*  CL 104 110 106 105 107  CO2 _1 GLUCOSE 219* 162* 165* 190* 200*  BUN 38* _2 CREATININE 0.91 0.39* <0.30* 0.32* 0.38*  CALCIUM 8.9 8.8* 8.6* 8.6* 8.5*  MG 1.1*  --  1.3* 1.5* 1.5*  PHOS 3.6  --  3.2  --   --    GFR: Estimated Creatinine Clearance: 86.2 mL/min (A) (by C-G formula based on SCr of 0.38 mg/dL (L)). Liver Function Tests: Recent Labs  Lab 11/16/20 1123 11/18/20 0450 11/20/20 6606  AST 25 18 14*  ALT _0 ALKPHOS 88 77 79  BILITOT 0.5 0.6 0.5  PROT 6.0* 5.2* 5.3*  ALBUMIN 2.6* 2.2* 2.0*   No results for input(s): LIPASE, AMYLASE in the last 168 hours. No results for input(s): AMMONIA in the last 168 hours. Coagulation Profile: Recent Labs  Lab 11/16/20 1123 11/17/20 0459  INR 1.2 1.2   Cardiac Enzymes: No results for input(s): CKTOTAL, CKMB, CKMBINDEX, TROPONINI in the last 168 hours. BNP (last 3 results) No results for input(s): PROBNP in the last 8760 hours. HbA1C: No results for input(s): HGBA1C in the last 72 hours. CBG: Recent Labs  Lab 11/20/20 1615 11/20/20 2046 11/21/20 0036 11/21/20 0729 11/21/20 1131  GLUCAP 281* 268* 217* 169* 178*   Lipid Profile: No results for  input(s): CHOL, HDL, LDLCALC, TRIG, CHOLHDL, LDLDIRECT in the last 72 hours. Thyroid Function Tests: No results for input(s): TSH, T4TOTAL, FREET4, T3FREE, THYROIDAB in the last 72 hours. Anemia Panel: Recent Labs    11/18/20 1621  VITAMINB12 489  FOLATE 17.1  FERRITIN 205  TIBC 204*  IRON 17*  RETICCTPCT 2.1   Sepsis Labs: Recent Labs  Lab 11/16/20 1123 11/16/20 1334 11/17/20 0459  PROCALCITON  --   --  1.62  LATICACIDVEN 3.5* 3.1*  --     Recent Results (from the past 240 hour(s))  Blood Culture (routine x 2)     Status: None   Collection Time: 11/16/20 11:23 AM   Specimen: BLOOD  Result Value Ref Range Status   Specimen Description   Final    BLOOD RIGHT ANTECUBITAL Performed at [place] [address] [place], [place] [address]    Special Requests   Final    BOTTLES DRAWN AEROBIC AND ANAEROBIC Blood Culture adequate volume Performed at [facility] [address], [place], [place] [address]    Culture   Final    NO GROWTH 5 DAYS Performed at [facility], [place] [address] [place], [place] [address]    Report Status 11/21/2020 FINAL  Final  Blood Culture (routine x 2)     Status: None   Collection Time: 11/16/20 11:40 AM   Specimen: BLOOD RIGHT FOREARM  Result Value Ref Range Status   Specimen Description   Final    BLOOD RIGHT FOREARM Performed at [place] [address], [place], [place] [address]    Special Requests   Final    BOTTLES DRAWN AEROBIC ONLY Blood Culture results may not be optimal due to an inadequate volume of blood received in culture bottles Performed at [place] [address] [place], [place] [address]    Culture   Final    NO GROWTH 5 DAYS Performed at [facility], [place] [address], [place], [place] [address]    Report Status 11/21/2020 FINAL  Final  Urine culture     Status: Abnormal   Collection Time: 11/16/20 11:40 AM    Specimen: In/Out Cath Urine  Result Value Ref Range Status   Specimen Description   Final    IN/OUT CATH URINE Performed at [place] [address], [place], [place] [address]    Special Requests   Final    NONE Performed at [facility], [place] [address], [place], [place] [address]    Culture >=100,000 COLONIES/mL PROTEUS MIRABILIS (A)  Final   Report Status 11/19/2020 FINAL  Final   Organism ID,  Bacteria PROTEUS MIRABILIS (A)  Final      Susceptibility   Proteus mirabilis - MIC*    AMPICILLIN <=2 SENSITIVE Sensitive     CEFAZOLIN <=4 SENSITIVE Sensitive     CEFEPIME <=0.12 SENSITIVE Sensitive     CEFTRIAXONE <=0.25 SENSITIVE Sensitive     CIPROFLOXACIN <=0.25 SENSITIVE Sensitive     GENTAMICIN <=1 SENSITIVE Sensitive     IMIPENEM 2 SENSITIVE Sensitive     NITROFURANTOIN 128 RESISTANT Resistant     TRIMETH/SULFA >=320 RESISTANT Resistant     AMPICILLIN/SULBACTAM <=2 SENSITIVE Sensitive     PIP/TAZO <=4 SENSITIVE Sensitive     * >=100,000 COLONIES/mL PROTEUS MIRABILIS  Resp Panel by RT-PCR (Flu A&B, Covid) Nasopharyngeal Swab     Status: None   Collection Time: 11/16/20 11:46 AM   Specimen: Nasopharyngeal Swab; Nasopharyngeal(NP) swabs in vial transport medium  Result Value Ref Range Status   SARS Coronavirus 2 by RT PCR NEGATIVE NEGATIVE Final    Comment: (NOTE) SARS-CoV-2 target nucleic acids are NOT DETECTED.  The SARS-CoV-2 RNA is generally detectable in upper respiratory specimens during the acute phase of infection. The lowest concentration of SARS-CoV-2 viral copies this assay can detect is 138 copies/mL. A negative result does not preclude SARS-Cov-2 infection and should not be used as the sole basis for treatment or other patient management decisions. A negative result may occur with  improper specimen collection/handling, submission of specimen other than nasopharyngeal swab, presence of viral mutation(s) within the areas  targeted by this assay, and inadequate number of viral copies(<138 copies/mL). A negative result must be combined with clinical observations, patient history, and epidemiological information. The expected result is Negative.  Fact Sheet for Patients:  EntrepreneurPulse.com.au  Fact Sheet for Healthcare Providers:  IncredibleEmployment.be  This test is no t yet approved or cleared by the [place] FDA and  has been authorized for detection and/or diagnosis of SARS-CoV-2 by FDA under an Emergency Use Authorization (EUA). This EUA will remain  in effect (meaning this test can be used) for the duration of the COVID-19 declaration under Section 564(b)(1) of the Act, 21 U.S.C.section 360bbb-3(b)(1), unless the authorization is terminated  or revoked sooner.       Influenza A by PCR NEGATIVE NEGATIVE Final   Influenza B by PCR NEGATIVE NEGATIVE Final    Comment: (NOTE) The Xpert Xpress SARS-CoV-2/FLU/RSV plus assay is intended as an aid in the diagnosis of influenza from Nasopharyngeal swab specimens and should not be used as a sole basis for treatment. Nasal washings and aspirates are unacceptable for Xpert Xpress SARS-CoV-2/FLU/RSV testing.  Fact Sheet for Patients: EntrepreneurPulse.com.au  Fact Sheet for Healthcare Providers: IncredibleEmployment.be  This test is not yet approved or cleared by the [place] FDA and has been authorized for detection and/or diagnosis of SARS-CoV-2 by FDA under an Emergency Use Authorization (EUA). This EUA will remain in effect (meaning this test can be used) for the duration of the COVID-19 declaration under Section 564(b)(1) of the Act, 21 U.S.C. section 360bbb-3(b)(1), unless the authorization is terminated or revoked.  Performed at [facility], [place] [address] [place], [place] [address]   MRSA PCR Screening     Status: None   Collection  Time: 11/21/20  6:02 AM   Specimen: Nasal Mucosa; Nasopharyngeal  Result Value Ref Range Status   MRSA by PCR NEGATIVE NEGATIVE Final    Comment:        The GeneXpert MRSA Assay (FDA approved for NASAL specimens only), is  one component of a comprehensive MRSA colonization surveillance program. It is not intended to diagnose MRSA infection nor to guide or monitor treatment for MRSA infections. Performed at [facility], [place] [address] [place], [place] [address]      Radiology Studies: DG Abd 2 Views  Result Date: 11/21/2020 CLINICAL DATA:  Right lower quadrant pain.  Left-sided colostomy. EXAM: ABDOMEN - 2 VIEW COMPARISON:  April 22, 2020 FINDINGS: Lung bases are normal. The patient has a PEG tube. No free air, portal venous gas, or pneumatosis identified. No bowel obstruction is noted. IMPRESSION: No acute abnormalities.  Peg tube.  Left lower quadrant colostomy. Electronically Signed   By: Dorise Bullion III M.D   On: 11/21/2020 12:32    Scheduled Meds: . aspirin EC  81 mg Oral QHS  . atorvastatin  20 mg Oral QHS  . Chlorhexidine Gluconate Cloth  6 each Topical Daily  . cholestyramine light  4 g Oral Q24H  . collagenase   Topical Daily  . feeding supplement (PROSource TF)  45 mL Per Tube Daily  . ferrous sulfate  300 mg Per Tube Daily  . free water  150 mL Per Tube QID  . gabapentin  300 mg Per Tube QHS  . icosapent Ethyl  2 g Oral BID  . insulin aspart  0-15 Units Subcutaneous TID WC  . insulin aspart  4 Units Subcutaneous TID WC  . insulin glargine  15 Units Subcutaneous QHS  . lisinopril  10 mg Oral Daily  . nutrition supplement (JUVEN)  1 packet Per Tube BID BM  . pantoprazole  40 mg Oral BID  . senna-docusate  1 tablet Oral Daily  . sertraline  75 mg Oral QHS  . sodium chloride flush  3 mL Intravenous Q12H  . tamsulosin  0.4 mg Oral QPC supper  . traZODone  50 mg Oral QHS   Continuous Infusions: . 0.9 % NaCl with KCl 20 mEq / L 75 mL/hr at  11/21/20 0328  . ampicillin (OMNIPEN) IV 2 g (11/21/20 1329)     LOS: 5 days   Time spent: 25 minutes. More than 50% of the time was spent in counseling/coordination of care  Bonnell Public, MD [facility]  If 7PM-7AM, please contact night-coverage [email or web address]  11/21/2020, 2:05 PM   This record has been created using Systems analyst. Errors have been sought and corrected,but may not always be located. Such creation errors do not reflect on the standard of care.

## 2020-11-21 NOTE — TOC Progression Note (Signed)
Transition of Care (TOC) - Progression Note    Patient Details  Name: Brent [name]. MRN: [id number] Date of Birth: Jul 28, 1946  Transition of Care [facility]) CM/SW Contact  Lennart Pall, LCSW Phone Number: 11/21/2020, 1:44 PM  Clinical Narrative:    Alerted by MD that pt not yet medically ready for dc to SNF.  Plan for pt to return to [facility] and they are able to readmit over the weekend if pt is ready.  COVID test placed today and should have results by tomorrow.  Will ask TOC coverage tomorrow to monitor if ready for dc.   Expected Discharge Plan: [place] Barriers to Discharge: Continued Medical Work up  Expected Discharge Plan and Services Expected Discharge Plan: [place]   Discharge Planning Services: CM Consult Post Acute Care Choice: [place] Living arrangements for the past 2 months: [place]                                       Social Determinants of Health (SDOH) Interventions    Readmission Risk Interventions Readmission Risk Prevention Plan 06/08/2020  Transportation Screening Complete  Medication Review [employer]) Referral to Pharmacy  PCP or Specialist appointment within 3-5 days of discharge Not Complete  PCP/Specialist Appt Not Complete comments SNF resident  [facility] or [place] Complete  SW Recovery Care/Counseling Consult Complete  Palliative Care Screening Complete  Skilled Nursing Facility Complete  Some recent data might be hidden

## 2020-11-21 NOTE — Progress Notes (Signed)
Pt complained of nausea and required IV zofran two different times throughout this shift. MD made aware of nausea during morning rounds at bedside. New orders placed at that time. Will continue to monitor.

## 2020-11-22 DIAGNOSIS — Z433 Encounter for attention to colostomy: Secondary | ICD-10-CM | POA: Diagnosis not present

## 2020-11-22 DIAGNOSIS — K5909 Other constipation: Secondary | ICD-10-CM | POA: Diagnosis not present

## 2020-11-22 DIAGNOSIS — F411 Generalized anxiety disorder: Secondary | ICD-10-CM | POA: Diagnosis not present

## 2020-11-22 DIAGNOSIS — E785 Hyperlipidemia, unspecified: Secondary | ICD-10-CM | POA: Diagnosis present

## 2020-11-22 DIAGNOSIS — R627 Adult failure to thrive: Secondary | ICD-10-CM | POA: Diagnosis present

## 2020-11-22 DIAGNOSIS — R198 Other specified symptoms and signs involving the digestive system and abdomen: Secondary | ICD-10-CM | POA: Diagnosis not present

## 2020-11-22 DIAGNOSIS — I252 Old myocardial infarction: Secondary | ICD-10-CM | POA: Diagnosis not present

## 2020-11-22 DIAGNOSIS — L89214 Pressure ulcer of right hip, stage 4: Secondary | ICD-10-CM | POA: Diagnosis not present

## 2020-11-22 DIAGNOSIS — I959 Hypotension, unspecified: Secondary | ICD-10-CM | POA: Diagnosis not present

## 2020-11-22 DIAGNOSIS — E782 Mixed hyperlipidemia: Secondary | ICD-10-CM | POA: Diagnosis not present

## 2020-11-22 DIAGNOSIS — R140 Abdominal distension (gaseous): Secondary | ICD-10-CM | POA: Diagnosis not present

## 2020-11-22 DIAGNOSIS — G4700 Insomnia, unspecified: Secondary | ICD-10-CM | POA: Diagnosis not present

## 2020-11-22 DIAGNOSIS — G360 Neuromyelitis optica [Devic]: Secondary | ICD-10-CM | POA: Diagnosis not present

## 2020-11-22 DIAGNOSIS — Z85820 Personal history of malignant melanoma of skin: Secondary | ICD-10-CM | POA: Diagnosis not present

## 2020-11-22 DIAGNOSIS — R41841 Cognitive communication deficit: Secondary | ICD-10-CM | POA: Diagnosis not present

## 2020-11-22 DIAGNOSIS — A4189 Other specified sepsis: Secondary | ICD-10-CM | POA: Diagnosis not present

## 2020-11-22 DIAGNOSIS — R112 Nausea with vomiting, unspecified: Secondary | ICD-10-CM | POA: Diagnosis not present

## 2020-11-22 DIAGNOSIS — M47816 Spondylosis without myelopathy or radiculopathy, lumbar region: Secondary | ICD-10-CM | POA: Diagnosis not present

## 2020-11-22 DIAGNOSIS — F3489 Other specified persistent mood disorders: Secondary | ICD-10-CM | POA: Diagnosis not present

## 2020-11-22 DIAGNOSIS — A419 Sepsis, unspecified organism: Secondary | ICD-10-CM | POA: Diagnosis not present

## 2020-11-22 DIAGNOSIS — Z86010 Personal history of colonic polyps: Secondary | ICD-10-CM | POA: Diagnosis not present

## 2020-11-22 DIAGNOSIS — T83518A Infection and inflammatory reaction due to other urinary catheter, initial encounter: Secondary | ICD-10-CM | POA: Diagnosis not present

## 2020-11-22 DIAGNOSIS — R2681 Unsteadiness on feet: Secondary | ICD-10-CM | POA: Diagnosis not present

## 2020-11-22 DIAGNOSIS — Z880 Allergy status to penicillin: Secondary | ICD-10-CM | POA: Diagnosis not present

## 2020-11-22 DIAGNOSIS — E876 Hypokalemia: Secondary | ICD-10-CM | POA: Diagnosis not present

## 2020-11-22 DIAGNOSIS — Z20822 Contact with and (suspected) exposure to covid-19: Secondary | ICD-10-CM | POA: Diagnosis not present

## 2020-11-22 DIAGNOSIS — G4733 Obstructive sleep apnea (adult) (pediatric): Secondary | ICD-10-CM | POA: Diagnosis present

## 2020-11-22 DIAGNOSIS — R110 Nausea: Secondary | ICD-10-CM | POA: Diagnosis not present

## 2020-11-22 DIAGNOSIS — R7881 Bacteremia: Secondary | ICD-10-CM | POA: Diagnosis not present

## 2020-11-22 DIAGNOSIS — Z794 Long term (current) use of insulin: Secondary | ICD-10-CM | POA: Diagnosis not present

## 2020-11-22 DIAGNOSIS — N3091 Cystitis, unspecified with hematuria: Secondary | ICD-10-CM | POA: Diagnosis not present

## 2020-11-22 DIAGNOSIS — R404 Transient alteration of awareness: Secondary | ICD-10-CM | POA: Diagnosis not present

## 2020-11-22 DIAGNOSIS — M2550 Pain in unspecified joint: Secondary | ICD-10-CM | POA: Diagnosis not present

## 2020-11-22 DIAGNOSIS — R6883 Chills (without fever): Secondary | ICD-10-CM | POA: Diagnosis not present

## 2020-11-22 DIAGNOSIS — E119 Type 2 diabetes mellitus without complications: Secondary | ICD-10-CM | POA: Diagnosis not present

## 2020-11-22 DIAGNOSIS — K9189 Other postprocedural complications and disorders of digestive system: Secondary | ICD-10-CM | POA: Diagnosis not present

## 2020-11-22 DIAGNOSIS — I10 Essential (primary) hypertension: Secondary | ICD-10-CM | POA: Diagnosis present

## 2020-11-22 DIAGNOSIS — R278 Other lack of coordination: Secondary | ICD-10-CM | POA: Diagnosis not present

## 2020-11-22 DIAGNOSIS — E872 Acidosis: Secondary | ICD-10-CM | POA: Diagnosis not present

## 2020-11-22 DIAGNOSIS — N179 Acute kidney failure, unspecified: Secondary | ICD-10-CM | POA: Diagnosis not present

## 2020-11-22 DIAGNOSIS — H6123 Impacted cerumen, bilateral: Secondary | ICD-10-CM | POA: Diagnosis not present

## 2020-11-22 DIAGNOSIS — I700 Atherosclerosis of aorta: Secondary | ICD-10-CM | POA: Diagnosis not present

## 2020-11-22 DIAGNOSIS — A4159 Other Gram-negative sepsis: Secondary | ICD-10-CM | POA: Diagnosis not present

## 2020-11-22 DIAGNOSIS — Z881 Allergy status to other antibiotic agents status: Secondary | ICD-10-CM | POA: Diagnosis not present

## 2020-11-22 DIAGNOSIS — R531 Weakness: Secondary | ICD-10-CM | POA: Diagnosis not present

## 2020-11-22 DIAGNOSIS — E1142 Type 2 diabetes mellitus with diabetic polyneuropathy: Secondary | ICD-10-CM | POA: Diagnosis not present

## 2020-11-22 DIAGNOSIS — Z7409 Other reduced mobility: Secondary | ICD-10-CM | POA: Diagnosis not present

## 2020-11-22 DIAGNOSIS — R1903 Right lower quadrant abdominal swelling, mass and lump: Secondary | ICD-10-CM | POA: Diagnosis not present

## 2020-11-22 DIAGNOSIS — E869 Volume depletion, unspecified: Secondary | ICD-10-CM | POA: Diagnosis not present

## 2020-11-22 DIAGNOSIS — R3129 Other microscopic hematuria: Secondary | ICD-10-CM | POA: Diagnosis not present

## 2020-11-22 DIAGNOSIS — N390 Urinary tract infection, site not specified: Secondary | ICD-10-CM | POA: Diagnosis not present

## 2020-11-22 DIAGNOSIS — G373 Acute transverse myelitis in demyelinating disease of central nervous system: Secondary | ICD-10-CM | POA: Diagnosis not present

## 2020-11-22 DIAGNOSIS — F331 Major depressive disorder, recurrent, moderate: Secondary | ICD-10-CM | POA: Diagnosis not present

## 2020-11-22 DIAGNOSIS — E440 Moderate protein-calorie malnutrition: Secondary | ICD-10-CM | POA: Diagnosis not present

## 2020-11-22 DIAGNOSIS — Z955 Presence of coronary angioplasty implant and graft: Secondary | ICD-10-CM | POA: Diagnosis not present

## 2020-11-22 DIAGNOSIS — R1312 Dysphagia, oropharyngeal phase: Secondary | ICD-10-CM | POA: Diagnosis not present

## 2020-11-22 DIAGNOSIS — K5730 Diverticulosis of large intestine without perforation or abscess without bleeding: Secondary | ICD-10-CM | POA: Diagnosis not present

## 2020-11-22 DIAGNOSIS — R5381 Other malaise: Secondary | ICD-10-CM | POA: Diagnosis not present

## 2020-11-22 DIAGNOSIS — M869 Osteomyelitis, unspecified: Secondary | ICD-10-CM | POA: Diagnosis not present

## 2020-11-22 DIAGNOSIS — Z6827 Body mass index (BMI) 27.0-27.9, adult: Secondary | ICD-10-CM | POA: Diagnosis not present

## 2020-11-22 DIAGNOSIS — R109 Unspecified abdominal pain: Secondary | ICD-10-CM | POA: Diagnosis not present

## 2020-11-22 DIAGNOSIS — L8995 Pressure ulcer of unspecified site, unstageable: Secondary | ICD-10-CM | POA: Diagnosis not present

## 2020-11-22 DIAGNOSIS — L89513 Pressure ulcer of right ankle, stage 3: Secondary | ICD-10-CM | POA: Diagnosis not present

## 2020-11-22 DIAGNOSIS — M6259 Muscle wasting and atrophy, not elsewhere classified, multiple sites: Secondary | ICD-10-CM | POA: Diagnosis not present

## 2020-11-22 DIAGNOSIS — R194 Change in bowel habit: Secondary | ICD-10-CM | POA: Diagnosis not present

## 2020-11-22 DIAGNOSIS — K592 Neurogenic bowel, not elsewhere classified: Secondary | ICD-10-CM | POA: Diagnosis not present

## 2020-11-22 DIAGNOSIS — Z1629 Resistance to other single specified antibiotic: Secondary | ICD-10-CM | POA: Diagnosis not present

## 2020-11-22 DIAGNOSIS — Z9049 Acquired absence of other specified parts of digestive tract: Secondary | ICD-10-CM | POA: Diagnosis not present

## 2020-11-22 DIAGNOSIS — L89154 Pressure ulcer of sacral region, stage 4: Secondary | ICD-10-CM | POA: Diagnosis not present

## 2020-11-22 DIAGNOSIS — B964 Proteus (mirabilis) (morganii) as the cause of diseases classified elsewhere: Secondary | ICD-10-CM | POA: Diagnosis not present

## 2020-11-22 DIAGNOSIS — N319 Neuromuscular dysfunction of bladder, unspecified: Secondary | ICD-10-CM | POA: Diagnosis not present

## 2020-11-22 DIAGNOSIS — E11622 Type 2 diabetes mellitus with other skin ulcer: Secondary | ICD-10-CM | POA: Diagnosis not present

## 2020-11-22 DIAGNOSIS — M6281 Muscle weakness (generalized): Secondary | ICD-10-CM | POA: Diagnosis not present

## 2020-11-22 DIAGNOSIS — L89314 Pressure ulcer of right buttock, stage 4: Secondary | ICD-10-CM | POA: Diagnosis not present

## 2020-11-22 DIAGNOSIS — R6521 Severe sepsis with septic shock: Secondary | ICD-10-CM | POA: Diagnosis not present

## 2020-11-22 DIAGNOSIS — N3001 Acute cystitis with hematuria: Secondary | ICD-10-CM | POA: Diagnosis not present

## 2020-11-22 DIAGNOSIS — E1165 Type 2 diabetes mellitus with hyperglycemia: Secondary | ICD-10-CM | POA: Diagnosis not present

## 2020-11-22 DIAGNOSIS — Z7401 Bed confinement status: Secondary | ICD-10-CM | POA: Diagnosis not present

## 2020-11-22 DIAGNOSIS — R6520 Severe sepsis without septic shock: Secondary | ICD-10-CM | POA: Diagnosis not present

## 2020-11-22 DIAGNOSIS — M8668 Other chronic osteomyelitis, other site: Secondary | ICD-10-CM | POA: Diagnosis not present

## 2020-11-22 DIAGNOSIS — I152 Hypertension secondary to endocrine disorders: Secondary | ICD-10-CM | POA: Diagnosis not present

## 2020-11-22 DIAGNOSIS — E118 Type 2 diabetes mellitus with unspecified complications: Secondary | ICD-10-CM | POA: Diagnosis not present

## 2020-11-22 DIAGNOSIS — K3189 Other diseases of stomach and duodenum: Secondary | ICD-10-CM | POA: Diagnosis not present

## 2020-11-22 DIAGNOSIS — Z931 Gastrostomy status: Secondary | ICD-10-CM | POA: Diagnosis not present

## 2020-11-22 DIAGNOSIS — I2510 Atherosclerotic heart disease of native coronary artery without angina pectoris: Secondary | ICD-10-CM | POA: Diagnosis not present

## 2020-11-22 DIAGNOSIS — K5989 Other specified functional intestinal disorders: Secondary | ICD-10-CM | POA: Diagnosis not present

## 2020-11-22 DIAGNOSIS — Z825 Family history of asthma and other chronic lower respiratory diseases: Secondary | ICD-10-CM | POA: Diagnosis not present

## 2020-11-22 DIAGNOSIS — Y846 Urinary catheterization as the cause of abnormal reaction of the patient, or of later complication, without mention of misadventure at the time of the procedure: Secondary | ICD-10-CM | POA: Diagnosis present

## 2020-11-22 DIAGNOSIS — Z800 Family history of malignant neoplasm of digestive organs: Secondary | ICD-10-CM | POA: Diagnosis not present

## 2020-11-22 DIAGNOSIS — G8220 Paraplegia, unspecified: Secondary | ICD-10-CM | POA: Diagnosis not present

## 2020-11-22 DIAGNOSIS — G8250 Quadriplegia, unspecified: Secondary | ICD-10-CM | POA: Diagnosis not present

## 2020-11-22 DIAGNOSIS — T839XXA Unspecified complication of genitourinary prosthetic device, implant and graft, initial encounter: Secondary | ICD-10-CM | POA: Diagnosis not present

## 2020-11-22 DIAGNOSIS — Z933 Colostomy status: Secondary | ICD-10-CM | POA: Diagnosis not present

## 2020-11-22 DIAGNOSIS — R319 Hematuria, unspecified: Secondary | ICD-10-CM | POA: Diagnosis not present

## 2020-11-22 DIAGNOSIS — L89613 Pressure ulcer of right heel, stage 3: Secondary | ICD-10-CM | POA: Diagnosis not present

## 2020-11-22 DIAGNOSIS — D6489 Other specified anemias: Secondary | ICD-10-CM | POA: Diagnosis not present

## 2020-11-22 LAB — RENAL FUNCTION PANEL
Albumin: 2.1 g/dL — ABNORMAL LOW (ref 3.5–5.0)
Anion gap: 7 (ref 5–15)
BUN: 8 mg/dL (ref 8–23)
CO2: 26 mmol/L (ref 22–32)
Calcium: 8.6 mg/dL — ABNORMAL LOW (ref 8.9–10.3)
Chloride: 108 mmol/L (ref 98–111)
Creatinine, Ser: <0.3 mg/dL — ABNORMAL LOW (ref 0.61–1.24)
Glucose, Bld: 194 mg/dL — ABNORMAL HIGH (ref 70–99)
Phosphorus: 3.2 mg/dL (ref 2.5–4.6)
Potassium: 3.4 mmol/L — ABNORMAL LOW (ref 3.5–5.1)
Sodium: 141 mmol/L (ref 135–145)

## 2020-11-22 LAB — GLUCOSE, CAPILLARY
Glucose-Capillary: 184 mg/dL — ABNORMAL HIGH (ref 70–99)
Glucose-Capillary: 147 mg/dL — ABNORMAL HIGH (ref 70–99)

## 2020-11-22 MED ORDER — AMOXICILLIN 500 MG PO TABS: 500.0000 mg | ORAL_TABLET | Freq: Three times a day (TID) | ORAL | 0 refills | Status: AC

## 2020-11-22 MED ORDER — PROSOURCE TF PO LIQD: 45.0000 mL | Freq: Every day | ORAL | 0 refills | Status: DC

## 2020-11-22 MED ORDER — JUVEN PO PACK: 1.0000 | PACK | Freq: Two times a day (BID) | ORAL | 0 refills | Status: DC

## 2020-11-22 MED ORDER — FREE WATER: 150.0000 mL | Freq: Four times a day (QID) | 0 refills | Status: DC

## 2020-11-22 MED ORDER — COLLAGENASE 250 UNIT/GM EX OINT: TOPICAL_OINTMENT | Freq: Every day | CUTANEOUS | 0 refills | Status: DC

## 2020-11-22 NOTE — Discharge Summary (Signed)
Physician Discharge Summary  Patient ID: Brent [name]. MRN: [id number] DOB/AGE: March 31, 1946 75 y.o.  Admit date: 11/16/2020 Discharge date: 11/22/2020  Admission Diagnoses:  Discharge Diagnoses:  Principal Problem:   Sepsis [facility]) Active Problems:   Essential hypertension   Controlled type 2 diabetes mellitus with hyperglycemia, with long-term current use of insulin (HCC)   Hematuria   Pressure injury of skin Probable UTI secondary to Proteus.  Discharged Condition: stable  Hospital Course: Patient is a 75 year old Caucasian male with past medical history significant for transverse myelitis s/p rituximab, paraplegia secondary to transverse myelitis, hypertension, hyperlipidemia, insulin-dependent diabetes, status post G-tube placement, CAD, stage IV sacral pressure ulcer with left gluteal abscess and L4-L5 osteomyelitis and right psoas abscess in September and followed with ID at the end of 2021, and s/p diverting loop colostomy.  Patient was admitted from a skilled nursing facility with dizziness, generalized weakness, fatigue,low back pain,nausea and hematuria via Foley catheter for about 2 days preceding admission.  Work-up revealed possible sepsis secondary to UTI.  Urine culture grew Proteus mirabilis.  Patient was initially treated with cefepime, vancomycin and Flagyl, and later transitioned to IV ampicillin.  Patient will complete his course of antibiotics with oral amoxicillin 500 mg p.o. 3 times daily.  Antibiotics will be completed on 11/25/2020.  Infectious disease team directed antibiotics treatment.  Wound care was continued during the hospital stay.  Due to bloody urine, urology was consulted.  Urology team has advised patient to follow-up with his outpatient urologist for further work-up, and possible cystoscopy.  Patient was adequately hydrated during the hospital stay.  Abnormal electrolytes were corrected.  Patient has been optimized, will be discharged back to the skilled  nursing facility.  Patient will follow with primary care provider and the urology team on discharge.       Severe sepsis secondary to UTI: -See above documentation. -Patient will complete course of antibiotics on 11/25/2020. -Patient will be discharged on amoxicillin 500 mg p.o. 3 times daily.    Gross hematuria: -Patient with chronic indwelling catheter.   -Urology was consulted to assist with patient's management.  Hemorrhagic cystitis was considered. -Urology has advised patient to follow-up with his outpatient urologist for further assessment and management, and possible cystoscopy.  Urology also suggested option of suprapubic catheter and circumcision.  Chronic decubitus ulcers/chronic osteomyelitis/chronic discitis: -ESR and CRP were stable.      Microcytic anemia: -Continue to monitor hemoglobin, considering gross hematuria. -Continue home dose of iron supplement  Hypokalemia/hypomagnesemia. -Electrolytes were monitored and repleted during the hospital stay.    Insulin-dependent diabetes.   -Hemoglobin A1c at 8.2.   -Continue to monitor and optimize diabetes mellitus management.    Hypertension: -Continue to monitor closely and optimize. -Goal blood pressure should be less than 130/80 mmHg.    History of depression and anxiety: -Stable.   -Continue home medications.    GERD. -Continue Protonix  History of dysphagia s/p G-tube: -Patient is able to tolerate diet, uses G-tube for medications. -Can be reassessed as an outpatient by PCP for discontinuation of G-tube  Paraplegia secondary to transverse myelitis: -Stable.   Consults: ID and urology  Significant Diagnostic Studies: -Urine culture grew Proteus mirabilis. -Blood culture has not grown any organisms to date.  MRI pelvis with and without contrast: 1. Decubitus ulcer along the right lower buttock region extends towards the right ischial tuberosity and may extend to the periosteal margin, but  there is no abnormal bony enhancement to suggest current right ischial osteomyelitis. 2. Accentuated  T2 signal and enhancement in the lower sacrum at about the S4 and S5 level, and potentially of the adjacent coccyx. This is compatible with low-grade chronic osteomyelitis. Thinning and edema of the overlying cutaneous and subcutaneous structures. 3. Mild wall thickening and accentuated enhancement in the urinary bladder suggesting cystitis. 4. Subtle accentuation of enhancement in the distal ureters, particularly the right, possibly reflecting mild ureteritis. 5. Trace free pelvic fluid, origin uncertain. 6. Sigmoid colon diverticulosis.  Discharge Exam: Blood pressure (!) 146/53, pulse 61, temperature 97.8 F (36.6 C), temperature source Oral, resp. rate 16, height 6' (1.829 m), weight 91.6 kg, SpO2 97 %.   Disposition: Discharge disposition: 03-Skilled Nursing Facility.  Follow with primary care provider and urology on discharge.  Continue wound care.  Discharge Instructions    Diet - low sodium heart healthy   Complete by: As directed    Discharge wound care:   Complete by: As directed    Continue current wound care plan.   Increase activity slowly   Complete by: As directed      Allergies as of 11/22/2020      Reactions   Levofloxacin Rash      Medication List    STOP taking these medications   diclofenac Sodium 1 % Gel Commonly known as: VOLTAREN   doxycycline 100 MG capsule Commonly known as: VIBRAMYCIN   feeding supplement (PRO-STAT SUGAR FREE 64) Liqd   meclizine 12.5 MG tablet Commonly known as: ANTIVERT   multivitamin with minerals Tabs tablet   naproxen 500 MG EC tablet Commonly known as: EC NAPROSYN   ondansetron 4 MG disintegrating tablet Commonly known as: ZOFRAN-ODT   ondansetron 4 MG tablet Commonly known as: ZOFRAN   saccharomyces boulardii 250 MG capsule Commonly known as: FLORASTOR   traMADol 50 MG tablet Commonly known as: ULTRAM      TAKE these medications   acetaminophen 325 MG tablet Commonly known as: TYLENOL Take 2 tablets (650 mg total) by mouth every 6 (six) hours as needed for headache. What changed: Another medication with the same name was removed. Continue taking this medication, and follow the directions you see here.   albuterol (2.5 MG/3ML) 0.083% nebulizer solution Commonly known as: PROVENTIL Take 3 mLs (2.5 mg total) by nebulization once as needed for wheezing or shortness of breath (or anaphylaxis due to Rituxan infusion).   amoxicillin 500 MG tablet Commonly known as: AMOXIL Take 1 tablet (500 mg total) by mouth 3 (three) times daily for 4 days. Stop date 11/25/20   antiseptic oral rinse Liqd 10 mLs by Mouth Rinse route 2 (two) times daily. Rinse and spit   aspirin EC 81 MG tablet Take 81 mg by mouth at bedtime. Swallow whole.   atorvastatin 20 MG tablet Commonly known as: LIPITOR Take 1 tablet (20 mg total) by mouth daily.   Biofreeze 4 % Gel Generic drug: Menthol (Topical Analgesic) Apply 1 application topically in the morning, at noon, and at bedtime. What changed: Another medication with the same name was removed. Continue taking this medication, and follow the directions you see here.   cholestyramine 4 g packet Commonly known as: QUESTRAN Take 4 g by mouth daily.   collagenase ointment Commonly known as: SANTYL Apply topically daily. Start taking on: November 23, 2020   ferrous sulfate 300 (60 Fe) MG/5ML syrup Take 300 mg by mouth daily.   fluticasone 50 MCG/ACT nasal spray Commonly known as: FLONASE Place 2 sprays into both nostrils 2 (two) times daily.  free water Soln Place 150 mLs into feeding tube 4 (four) times daily.   gabapentin 300 MG capsule Commonly known as: NEURONTIN Place 1 capsule (300 mg total) into feeding tube at bedtime. What changed:   how to take this  when to take this   insulin aspart 100 UNIT/ML injection Commonly known as: novoLOG Inject 0-15  Units into the skin every 4 (four) hours. Sliding scale  CBG 70 - 120: 0 units: CBG 121 - 150: 2 units; CBG 151 - 200: 3 units; CBG 201 - 250: 5 units; CBG 251 - 300: 8 units;CBG 301 - 350: 11 units; CBG 351 - 400: 15 units; CBG > 400 : 15 units and notify MD What changed: additional instructions   insulin glargine 100 unit/mL Sopn Commonly known as: LANTUS Inject 15 Units into the skin at bedtime. What changed: how much to take   lactulose 10 GM/15ML solution Commonly known as: CHRONULAC Take 15 g by mouth as needed for mild constipation.   Lidocaine 4 % Ptch Apply 1 patch topically daily. Apply to right hip in the morning remove at night   lisinopril 10 MG tablet Commonly known as: ZESTRIL Take 10 mg by mouth daily.   metFORMIN 1000 MG tablet Commonly known as: GLUCOPHAGE Take 1,000 mg by mouth daily.   nutrition supplement (JUVEN) Pack Place 1 packet into feeding tube 2 (two) times daily between meals. What changed: how to take this   feeding supplement (PROSource TF) liquid Place 45 mLs into feeding tube daily. What changed: You were already taking a medication with the same name, and this prescription was added. Make sure you understand how and when to take each.   pantoprazole 40 MG tablet Commonly known as: PROTONIX Take 40 mg by mouth 2 (two) times daily. What changed: Another medication with the same name was removed. Continue taking this medication, and follow the directions you see here.   polyvinyl alcohol 1.4 % ophthalmic solution Commonly known as: LIQUIFILM TEARS Place 2 drops into both eyes as needed for dry eyes.   senna-docusate 8.6-50 MG tablet Commonly known as: Senokot-S Take 1 tablet by mouth daily.   sertraline 50 MG tablet Commonly known as: ZOLOFT Take 50 mg by mouth daily. Give with 6m dose   sertraline 25 MG tablet Commonly known as: ZOLOFT Take 25 mg by mouth daily. Give with 566mdose   Systane 0.4-0.3 % Soln Generic drug: Polyethyl  Glycol-Propyl Glycol Place 2 drops into both eyes 2 (two) times daily.   tamsulosin 0.4 MG Caps capsule Commonly known as: FLOMAX Take 1 capsule (0.4 mg total) by mouth daily after supper.   traZODone 50 MG tablet Commonly known as: DESYREL Take 50 mg by mouth at bedtime.   Vascepa 1 g capsule Generic drug: icosapent Ethyl Take 2 capsules (2 g total) by mouth 2 (two) times daily.   VITA-C PO Take 500 mg by mouth daily.   vitamin B-12 500 MCG tablet Commonly known as: CYANOCOBALAMIN Take 500 mcg by mouth daily.   zinc sulfate 220 (50 Zn) MG capsule Place 1 capsule (220 mg total) into feeding tube daily. What changed: how to take this            Discharge Care Instructions  (From admission, onward)         Start     Ordered   11/22/20 0000  Discharge wound care:       Comments: Continue current wound care plan.   11/22/20 1236  SignedBonnell Public 11/22/2020, 12:36 PM

## 2020-11-22 NOTE — Plan of Care (Signed)
  Problem: Education: Goal: Knowledge of General Education information will improve Description: Including pain rating scale, medication(s)/side effects and non-pharmacologic comfort measures Outcome: Completed/Met   Problem: Health Behavior/Discharge Planning: Goal: Ability to manage health-related needs will improve Outcome: Completed/Met   Problem: Clinical Measurements: Goal: Ability to maintain clinical measurements within normal limits will improve Outcome: Completed/Met Goal: Will remain free from infection Outcome: Completed/Met Goal: Diagnostic test results will improve Outcome: Completed/Met Goal: Respiratory complications will improve Outcome: Completed/Met Goal: Cardiovascular complication will be avoided Outcome: Completed/Met   Problem: Activity: Goal: Risk for activity intolerance will decrease Outcome: Completed/Met   Problem: Nutrition: Goal: Adequate nutrition will be maintained Outcome: Completed/Met   Problem: Coping: Goal: Level of anxiety will decrease Outcome: Completed/Met   Problem: Elimination: Goal: Will not experience complications related to bowel motility Outcome: Completed/Met Goal: Will not experience complications related to urinary retention Outcome: Completed/Met   Problem: Pain Managment: Goal: General experience of comfort will improve Outcome: Completed/Met   Problem: Safety: Goal: Ability to remain free from injury will improve Outcome: Completed/Met   Problem: Skin Integrity: Goal: Risk for impaired skin integrity will decrease Outcome: Completed/Met   Problem: Fluid Volume: Goal: Hemodynamic stability will improve Outcome: Completed/Met   Problem: Clinical Measurements: Goal: Diagnostic test results will improve Outcome: Completed/Met Goal: Signs and symptoms of infection will decrease Outcome: Completed/Met   Problem: Respiratory: Goal: Ability to maintain adequate ventilation will improve Outcome:  Completed/Met

## 2020-11-22 NOTE — Progress Notes (Signed)
Pt discharged to [facility] per Dr. Marthenia Rolling. Pt's IV sites D/C'd and WDL. Wound vac site d/c'd and wet to dry dressing placed for transport. VSS. Report called to Estill Cotta, receiving nurse. Verbalized understanding. Pt and wife updated on plan of care. Verbalized understanding. Pt left floor via stretcher in stable condition accompanied by [facility] staff.

## 2020-11-22 NOTE — Progress Notes (Signed)
Pt alert, able to swallow PO meds and  no complains of nausea thru the night.

## 2020-11-22 NOTE — Plan of Care (Signed)
  Problem: Health Behavior/Discharge Planning: Goal: Ability to manage health-related needs will improve Outcome: Progressing   Problem: Clinical Measurements: Goal: Ability to maintain clinical measurements within normal limits will improve Outcome: Progressing   Problem: Clinical Measurements: Goal: Will remain free from infection Outcome: Progressing   Problem: Clinical Measurements: Goal: Diagnostic test results will improve Outcome: Progressing   Problem: Nutrition: Goal: Adequate nutrition will be maintained Outcome: Progressing   Problem: Pain Managment: Goal: General experience of comfort will improve Outcome: Progressing

## 2020-11-22 NOTE — TOC Transition Note (Addendum)
Transition of Care [facility]) - CM/SW Discharge Note   Patient Details  Name: Brent [name]. MRN: [id number] Date of Birth: 10-19-1945  Transition of Care [facility]) CM/SW Contact:  Ova Freshwater Phone Number: [phone] 11/22/2020, 11:47 AM   Clinical Narrative:     Patient will d/c back to [facility], report [phone].  Attending and Unit RN notified. Pending d/c summary. Facesheet, Med Necessity Form and DNR, are in patient's folder.  Final next level of care: [place] Barriers to Discharge: Continued Medical Work up   Patient Goals and CMS Choice Patient states their goals for this hospitalization and ongoing recovery are:: return back to [facility] CMS Medicare.gov Compare Post Acute Care list provided to:: Patient Choice offered to / list presented to : Patient  Discharge Placement                       Discharge Plan and Services   Discharge Planning Services: CM Consult Post Acute Care Choice: [place]                               Social Determinants of Health (SDOH) Interventions     Readmission Risk Interventions Readmission Risk Prevention Plan 06/08/2020  Transportation Screening Complete  Medication Review (RN Care Manager) Referral to Pharmacy  PCP or Specialist appointment within 3-5 days of discharge Not Complete  PCP/Specialist Appt Not Complete comments SNF resident  [facility] or [place] Complete  SW Recovery Care/Counseling Consult Complete  Palliative Care Screening Complete  Skilled Nursing Facility Complete  Some recent data might be hidden

## 2020-11-24 DIAGNOSIS — R6520 Severe sepsis without septic shock: Secondary | ICD-10-CM | POA: Diagnosis not present

## 2020-11-24 DIAGNOSIS — B964 Proteus (mirabilis) (morganii) as the cause of diseases classified elsewhere: Secondary | ICD-10-CM | POA: Diagnosis not present

## 2020-11-24 DIAGNOSIS — T839XXA Unspecified complication of genitourinary prosthetic device, implant and graft, initial encounter: Secondary | ICD-10-CM | POA: Diagnosis not present

## 2020-11-24 DIAGNOSIS — M8668 Other chronic osteomyelitis, other site: Secondary | ICD-10-CM | POA: Diagnosis not present

## 2020-11-24 DIAGNOSIS — Z794 Long term (current) use of insulin: Secondary | ICD-10-CM | POA: Diagnosis not present

## 2020-11-24 DIAGNOSIS — F3489 Other specified persistent mood disorders: Secondary | ICD-10-CM | POA: Diagnosis not present

## 2020-11-24 DIAGNOSIS — N390 Urinary tract infection, site not specified: Secondary | ICD-10-CM | POA: Diagnosis not present

## 2020-11-24 DIAGNOSIS — E118 Type 2 diabetes mellitus with unspecified complications: Secondary | ICD-10-CM | POA: Diagnosis not present

## 2020-11-24 DIAGNOSIS — A4189 Other specified sepsis: Secondary | ICD-10-CM | POA: Diagnosis not present

## 2020-11-24 DIAGNOSIS — R3129 Other microscopic hematuria: Secondary | ICD-10-CM | POA: Diagnosis not present

## 2020-11-24 DIAGNOSIS — L89154 Pressure ulcer of sacral region, stage 4: Secondary | ICD-10-CM | POA: Diagnosis not present

## 2020-11-24 DIAGNOSIS — D6489 Other specified anemias: Secondary | ICD-10-CM | POA: Diagnosis not present

## 2020-11-25 ENCOUNTER — Inpatient Hospital Stay: Payer: Medicare Other | Admitting: Internal Medicine

## 2020-11-25 DIAGNOSIS — E11622 Type 2 diabetes mellitus with other skin ulcer: Secondary | ICD-10-CM | POA: Diagnosis not present

## 2020-11-25 DIAGNOSIS — L89314 Pressure ulcer of right buttock, stage 4: Secondary | ICD-10-CM | POA: Diagnosis not present

## 2020-11-25 DIAGNOSIS — G373 Acute transverse myelitis in demyelinating disease of central nervous system: Secondary | ICD-10-CM | POA: Diagnosis not present

## 2020-11-25 DIAGNOSIS — L89154 Pressure ulcer of sacral region, stage 4: Secondary | ICD-10-CM | POA: Diagnosis not present

## 2020-11-30 DIAGNOSIS — K5909 Other constipation: Secondary | ICD-10-CM | POA: Diagnosis not present

## 2020-11-30 DIAGNOSIS — R110 Nausea: Secondary | ICD-10-CM | POA: Diagnosis not present

## 2020-11-30 DIAGNOSIS — R198 Other specified symptoms and signs involving the digestive system and abdomen: Secondary | ICD-10-CM | POA: Diagnosis not present

## 2020-12-01 DIAGNOSIS — K5909 Other constipation: Secondary | ICD-10-CM | POA: Diagnosis not present

## 2020-12-01 DIAGNOSIS — L89214 Pressure ulcer of right hip, stage 4: Secondary | ICD-10-CM | POA: Diagnosis not present

## 2020-12-01 DIAGNOSIS — L89513 Pressure ulcer of right ankle, stage 3: Secondary | ICD-10-CM | POA: Diagnosis not present

## 2020-12-01 DIAGNOSIS — R198 Other specified symptoms and signs involving the digestive system and abdomen: Secondary | ICD-10-CM | POA: Diagnosis not present

## 2020-12-01 DIAGNOSIS — R110 Nausea: Secondary | ICD-10-CM | POA: Diagnosis not present

## 2020-12-01 DIAGNOSIS — L89613 Pressure ulcer of right heel, stage 3: Secondary | ICD-10-CM | POA: Diagnosis not present

## 2020-12-01 DIAGNOSIS — L89154 Pressure ulcer of sacral region, stage 4: Secondary | ICD-10-CM | POA: Diagnosis not present

## 2020-12-01 DIAGNOSIS — I152 Hypertension secondary to endocrine disorders: Secondary | ICD-10-CM | POA: Diagnosis not present

## 2020-12-03 DIAGNOSIS — G8220 Paraplegia, unspecified: Secondary | ICD-10-CM | POA: Diagnosis not present

## 2020-12-03 DIAGNOSIS — Z933 Colostomy status: Secondary | ICD-10-CM | POA: Diagnosis not present

## 2020-12-03 DIAGNOSIS — R110 Nausea: Secondary | ICD-10-CM | POA: Diagnosis not present

## 2020-12-03 DIAGNOSIS — I10 Essential (primary) hypertension: Secondary | ICD-10-CM | POA: Diagnosis not present

## 2020-12-07 DIAGNOSIS — K592 Neurogenic bowel, not elsewhere classified: Secondary | ICD-10-CM | POA: Diagnosis not present

## 2020-12-07 DIAGNOSIS — G4700 Insomnia, unspecified: Secondary | ICD-10-CM | POA: Diagnosis not present

## 2020-12-07 DIAGNOSIS — F411 Generalized anxiety disorder: Secondary | ICD-10-CM | POA: Diagnosis not present

## 2020-12-07 DIAGNOSIS — F331 Major depressive disorder, recurrent, moderate: Secondary | ICD-10-CM | POA: Diagnosis not present

## 2020-12-07 DIAGNOSIS — E782 Mixed hyperlipidemia: Secondary | ICD-10-CM | POA: Diagnosis not present

## 2020-12-07 DIAGNOSIS — I2510 Atherosclerotic heart disease of native coronary artery without angina pectoris: Secondary | ICD-10-CM | POA: Diagnosis not present

## 2020-12-07 DIAGNOSIS — E119 Type 2 diabetes mellitus without complications: Secondary | ICD-10-CM | POA: Diagnosis not present

## 2020-12-07 DIAGNOSIS — G373 Acute transverse myelitis in demyelinating disease of central nervous system: Secondary | ICD-10-CM | POA: Diagnosis not present

## 2020-12-07 DIAGNOSIS — N390 Urinary tract infection, site not specified: Secondary | ICD-10-CM | POA: Diagnosis not present

## 2020-12-07 DIAGNOSIS — N319 Neuromuscular dysfunction of bladder, unspecified: Secondary | ICD-10-CM | POA: Diagnosis not present

## 2020-12-07 DIAGNOSIS — G8250 Quadriplegia, unspecified: Secondary | ICD-10-CM | POA: Diagnosis not present

## 2020-12-07 DIAGNOSIS — E1142 Type 2 diabetes mellitus with diabetic polyneuropathy: Secondary | ICD-10-CM | POA: Diagnosis not present

## 2020-12-07 DIAGNOSIS — Z7409 Other reduced mobility: Secondary | ICD-10-CM | POA: Diagnosis not present

## 2020-12-07 DIAGNOSIS — G4733 Obstructive sleep apnea (adult) (pediatric): Secondary | ICD-10-CM | POA: Diagnosis not present

## 2020-12-07 DIAGNOSIS — I10 Essential (primary) hypertension: Secondary | ICD-10-CM | POA: Diagnosis not present

## 2020-12-08 DIAGNOSIS — R110 Nausea: Secondary | ICD-10-CM | POA: Diagnosis not present

## 2020-12-08 DIAGNOSIS — L89613 Pressure ulcer of right heel, stage 3: Secondary | ICD-10-CM | POA: Diagnosis not present

## 2020-12-08 DIAGNOSIS — K592 Neurogenic bowel, not elsewhere classified: Secondary | ICD-10-CM | POA: Diagnosis not present

## 2020-12-08 DIAGNOSIS — R1903 Right lower quadrant abdominal swelling, mass and lump: Secondary | ICD-10-CM | POA: Diagnosis not present

## 2020-12-08 DIAGNOSIS — L89214 Pressure ulcer of right hip, stage 4: Secondary | ICD-10-CM | POA: Diagnosis not present

## 2020-12-08 DIAGNOSIS — E118 Type 2 diabetes mellitus with unspecified complications: Secondary | ICD-10-CM | POA: Diagnosis not present

## 2020-12-08 DIAGNOSIS — L89154 Pressure ulcer of sacral region, stage 4: Secondary | ICD-10-CM | POA: Diagnosis not present

## 2020-12-08 DIAGNOSIS — L89513 Pressure ulcer of right ankle, stage 3: Secondary | ICD-10-CM | POA: Diagnosis not present

## 2020-12-09 ENCOUNTER — Other Ambulatory Visit: Payer: Self-pay

## 2020-12-09 ENCOUNTER — Ambulatory Visit (INDEPENDENT_AMBULATORY_CARE_PROVIDER_SITE_OTHER): Payer: Medicare Other | Admitting: Internal Medicine

## 2020-12-09 ENCOUNTER — Encounter: Payer: Self-pay | Admitting: Internal Medicine

## 2020-12-09 DIAGNOSIS — N3091 Cystitis, unspecified with hematuria: Secondary | ICD-10-CM | POA: Diagnosis not present

## 2020-12-09 DIAGNOSIS — R7881 Bacteremia: Secondary | ICD-10-CM | POA: Diagnosis not present

## 2020-12-09 DIAGNOSIS — H6123 Impacted cerumen, bilateral: Secondary | ICD-10-CM | POA: Diagnosis not present

## 2020-12-09 DIAGNOSIS — N3001 Acute cystitis with hematuria: Secondary | ICD-10-CM | POA: Diagnosis not present

## 2020-12-09 DIAGNOSIS — R1903 Right lower quadrant abdominal swelling, mass and lump: Secondary | ICD-10-CM | POA: Diagnosis not present

## 2020-12-09 DIAGNOSIS — R110 Nausea: Secondary | ICD-10-CM | POA: Diagnosis not present

## 2020-12-09 DIAGNOSIS — K592 Neurogenic bowel, not elsewhere classified: Secondary | ICD-10-CM | POA: Diagnosis not present

## 2020-12-09 DIAGNOSIS — K5909 Other constipation: Secondary | ICD-10-CM | POA: Diagnosis not present

## 2020-12-09 NOTE — Progress Notes (Signed)
[facility]  Patient Active Problem List   Diagnosis Date Noted  . UTI (urinary tract infection) 11/24/2020    Priority: High  . Hemorrhagic cystitis     Priority: High  . Gram-negative bacteremia 06/05/2020    Priority: High  . Sacral ulcer, with necrosis of muscle ([place]) 04/09/2020    Priority: Medium  . Transverse myelitis ([place])     Priority: Medium  . Paraplegia ([place])     Priority: Medium  . Encounter for monitoring immunomodulating therapy 08/20/2020  . High risk medication use 08/20/2020  . Psoas abscess, right ([place]) 06/05/2020  . Malnutrition of moderate degree 06/04/2020  . Lumbar discitis 06/02/2020  . Palliative care by specialist   . Goals of care, counseling/discussion   . FTT (failure to thrive) in adult 06/01/2020  . Abdominal pain   . 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 Labile blood pressure   . Labile blood glucose   . Neurogenic bladder   . Neurogenic bowel   . Acute blood loss anemia   . Transaminitis   . Controlled type 2 diabetes mellitus with hyperglycemia, with long-term current use of insulin ([place])   . Dyslipidemia   . HCAP (healthcare-associated pneumonia)   . Coronary artery disease involving native coronary artery of native heart without angina pectoris   . Steroid-induced hyperglycemia   . Diabetic peripheral neuropathy ([place])   . Hyponatremia   . Weakness 03/09/2020  . Chest pain 03/08/2020  . Right leg numbness   . Malignant melanoma of left side of neck ([place]) 10/25/2018  . Facial neuritis 10/25/2018  . Myofascial pain syndrome 10/25/2018  . Occipital neuralgia of left side 10/25/2018  . CAP (community acquired pneumonia) 09/08/2017  . Male hypogonadism 06/20/2016  . Renal lesion 06/20/2016  . Insomnia 08/07/2015  . Enlarged prostate with lower urinary tract symptoms (LUTS) 11/07/2014  . Balanitis 07/30/2012  . Bladder neck obstruction 07/30/2012  . Prostate nodule 06/18/2012  . Recurrent  nephrolithiasis 06/18/2012  . Benign neoplasm of colon 08/29/2011  . DEPRESSION, SITUATIONAL, ACUTE 01/23/2010  . Essential hypertension 05/30/2009  . Coronary atherosclerosis 05/30/2009  . Obstructive sleep apnea 11/28/2008  . OBESITY 09/23/2008  . ERECTILE DYSFUNCTION 11/26/2007  . Well controlled type 2 diabetes mellitus with peripheral circulatory disorder ([place]) 05/09/2007  . Hyperlipidemia 05/09/2007  . MYOCARDIAL INFARCTION, HX OF 05/09/2007  . DIVERTICULOSIS, COLON 05/09/2007    Patient's Medications  New Prescriptions   No medications on file  Previous Medications   ACETAMINOPHEN (TYLENOL) 325 MG TABLET    Take 2 tablets (650 mg total) by mouth every 6 (six) hours as needed for headache.   ALBUTEROL (PROVENTIL) (2.5 MG/3ML) 0.083% NEBULIZER SOLUTION    Take 3 mLs (2.5 mg total) by nebulization once as needed for wheezing or shortness of breath (or anaphylaxis due to Rituxan infusion).   ANTISEPTIC ORAL RINSE (BIOTENE) LIQD    10 mLs by Mouth Rinse route 2 (two) times daily. Rinse and spit   ASCORBIC ACID (VITA-C PO)    Take 500 mg by mouth daily.   ASPIRIN EC 81 MG TABLET    Take 81 mg by mouth at bedtime. Swallow whole.   ATORVASTATIN (LIPITOR) 20 MG TABLET    Take 1 tablet (20 mg total) by mouth daily.   CHOLESTYRAMINE (QUESTRAN) 4 G PACKET    Take 4 g by mouth daily.   COLLAGENASE (SANTYL) OINTMENT    Apply topically daily.   FERROUS SULFATE  300 (60 FE) MG/5ML SYRUP    Take 300 mg by mouth daily.   FLUTICASONE (FLONASE) 50 MCG/ACT NASAL SPRAY    Place 2 sprays into both nostrils 2 (two) times daily.   GABAPENTIN (NEURONTIN) 300 MG CAPSULE    Place 1 capsule (300 mg total) into feeding tube at bedtime.   HYDROGEN PEROXIDE ([employer]S PEROXIDE SORE MOUTH CLEANS) 1.5 % SOLN    Apply topically 2 times daily at 12 noon and 4 pm. 10 mL, mucous membrane, twice a day, rinse and spit   ICOSAPENT ETHYL (VASCEPA) 1 G CAPS    Take 2 capsules (2 g total) by mouth 2 (two) times daily.   INSULIN  ASPART (NOVOLOG) 100 UNIT/ML INJECTION    Inject 0-15 Units into the skin every 4 (four) hours. Sliding scale  CBG 70 - 120: 0 units: CBG 121 - 150: 2 units; CBG 151 - 200: 3 units; CBG 201 - 250: 5 units; CBG 251 - 300: 8 units;CBG 301 - 350: 11 units; CBG 351 - 400: 15 units; CBG > 400 : 15 units and notify MD   INSULIN GLARGINE (LANTUS) 100 UNIT/ML SOPN    Inject 15 Units into the skin at bedtime.   LACTULOSE (CHRONULAC) 10 GM/15ML SOLUTION    Take 15 g by mouth as needed for mild constipation.   LIDOCAINE 4 % PTCH    Apply 1 patch topically daily. Apply to right hip in the morning remove at night   LISINOPRIL (ZESTRIL) 10 MG TABLET    Take 10 mg by mouth daily.   MENTHOL, TOPICAL ANALGESIC, (BIOFREEZE) 4 % GEL    Apply 1 application topically in the morning, at noon, and at bedtime.    METFORMIN (GLUCOPHAGE) 1000 MG TABLET    Take 1,000 mg by mouth daily.   NUTRITION SUPPLEMENT, JUVEN, (JUVEN) PACK    Place 1 packet into feeding tube 2 (two) times daily between meals.   NUTRITIONAL SUPPLEMENTS (FEEDING SUPPLEMENT, PROSOURCE TF,) LIQUID    Place 45 mLs into feeding tube daily.   PANTOPRAZOLE (PROTONIX) 40 MG TABLET    Take 40 mg by mouth 2 (two) times daily.   POLYETHYL GLYCOL-PROPYL GLYCOL (SYSTANE) 0.4-0.3 % SOLN    Place 2 drops into both eyes 2 (two) times daily.   POLYVINYL ALCOHOL (LIQUIFILM TEARS) 1.4 % OPHTHALMIC SOLUTION    Place 2 drops into both eyes as needed for dry eyes.   SENNA-DOCUSATE (SENOKOT-S) 8.6-50 MG TABLET    Take 1 tablet by mouth daily.   SERTRALINE (ZOLOFT) 25 MG TABLET    Take 25 mg by mouth daily. Give with 50mg  dose   SERTRALINE (ZOLOFT) 50 MG TABLET    Take 50 mg by mouth daily. Give with 25mg  dose   TAMSULOSIN (FLOMAX) 0.4 MG CAPS CAPSULE    Take 1 capsule (0.4 mg total) by mouth daily after supper.   TRAZODONE (DESYREL) 50 MG TABLET    Take 50 mg by mouth at bedtime.   VITAMIN B-12 (CYANOCOBALAMIN) 500 MCG TABLET    Take 500 mcg by mouth daily.   WATER FOR  IRRIGATION, STERILE (FREE WATER) SOLN    Place 150 mLs into feeding tube 4 (four) times daily.   ZINC SULFATE 220 (50 ZN) MG CAPSULE    Place 1 capsule (220 mg total) into feeding tube daily.  Modified Medications   No medications on file  Discontinued Medications   No medications on file    Subjective: Brent [name] is in for  his hospital follow-up visit.  He has a history of transverse myelitis causing paraplegia and neurogenic bladder.  He was hospital last year with complications of his sacral decubitus ulcer.  He had a gluteal abscess and vertebral infection.  Was treated with a lengthy course of IV antibiotics.  In late February of this year he developed hematuria and hypotension and was readmitted.  He had a Proteus urinary tract infection complicated by bacteremia.  He was treated with IV antibiotics and then converted to oral amoxicillin on discharge which he took until 11/25/2020.  He states that he is feeling much better.  He is receiving wound care at his skilled nursing facility and has a VAC dressing in place.  His nurses have told him that his sacral wound is looking better.  Review of Systems: Review of Systems  Constitutional: Negative for chills, diaphoresis and fever.  Respiratory: Negative for cough and shortness of breath.   Cardiovascular: Negative for chest pain.  Gastrointestinal: Positive for constipation and nausea. Negative for abdominal pain, diarrhea and vomiting.  Genitourinary: Negative for hematuria.  Musculoskeletal: Negative for back pain.  Psychiatric/Behavioral: Negative for depression.    Past Medical History:  Diagnosis Date  . Allergy   . CAD (coronary artery disease)    a. angioplasty of his RCA in 1990. b. bare metal stent placed in the RCA in 2000 followed by rotational atherectomy shortly after for stent restenosis. c. last cath was in 2012 showing stable moderate diffuse CAD. (70% mid LAD, 80% diagonal, 70% Ramus, 40% mid to distal RCA stent  restenosis). d. Low risk nuc in 2015.  . Diabetes mellitus   . Diverticulosis   . Elevated CK   . Erectile dysfunction   . Hemorrhoids   . HTN (hypertension)   . Hyperlipidemia   . Hypertriglyceridemia   . Malignant melanoma of left side of neck ([place]) 10/25/2018  . Myocardial infarction ([place])   . Obesity   . OSA (obstructive sleep apnea)   . Persistent disorder of initiating or maintaining sleep   . Personal history of colonic polyps 02/05/2003    Social History   Tobacco Use  . Smoking status: Former Smoker    Packs/day: 1.50    Years: 30.00    Pack years: 45.00    Types: Cigarettes    Quit date: 09/19/1980    Years since quitting: 40.2  . Smokeless tobacco: Never Used  Vaping Use  . Vaping Use: Never used  Substance Use Topics  . Alcohol use: Not Currently    Alcohol/week: 7.0 - 14.0 standard drinks    Types: 7 - 14 Shots of liquor per week  . Drug use: No    Family History  Problem Relation Age of Onset  . Liver cancer Father   . Lung cancer Father   . Heart disease Father   . Heart disease Sister   . Lung cancer Mother   . COPD Mother   . Obesity Mother   . Colon cancer Neg Hx   . Stomach cancer Neg Hx   . Pancreatic cancer Neg Hx     Allergies  Allergen Reactions  . Levofloxacin Rash    Objective: Vitals:   12/09/20 0951  BP: 136/69  Pulse: 69  Temp: (!) 97.5 F (36.4 C)  TempSrc: Oral  SpO2: 99%   There is no height or weight on file to calculate BMI.  Physical Exam Constitutional:      Comments: He is pleasant and in no distress.  He is resting quietly on a stretcher.  Cardiovascular:     Rate and Rhythm: Normal rate and regular rhythm.     Heart sounds: No murmur heard.   Pulmonary:     Effort: Pulmonary effort is normal.     Breath sounds: Normal breath sounds.  Abdominal:     Palpations: Abdomen is soft.     Tenderness: There is no abdominal tenderness.     Comments: He has a left-sided colostomy.  Genitourinary:    Comments:  Clear yellow urine in his Foley catheter. Musculoskeletal:     Comments: He has a VAC dressing on his sacral wound.  Psychiatric:        Mood and Affect: Mood normal.     Lab Results    Problem List Items Addressed This Visit      High   Gram-negative bacteremia    It appears that his bacteremia has been cured.      Hemorrhagic cystitis    His hemorrhagic cystitis has resolved following treatment for his urinary tract infection.      UTI (urinary tract infection)    He had an acute Proteus UTI that responded well to antibiotic therapy.  He does not need any further diagnostic testing or treatment for that at this point.      Relevant Medications   hydrogen peroxide (CVS PEROXIDE SORE MOUTH CLEANS) 1.5 % SOLN       Michel Bickers, MD [facility] for Infectious [place] [phone] pager   [phone] cell 12/09/2020, 10:11 AM

## 2020-12-09 NOTE — Assessment & Plan Note (Signed)
His hemorrhagic cystitis has resolved following treatment for his urinary tract infection.

## 2020-12-09 NOTE — Assessment & Plan Note (Signed)
It appears that his bacteremia has been cured.

## 2020-12-09 NOTE — Assessment & Plan Note (Signed)
He had an acute Proteus UTI that responded well to antibiotic therapy.  He does not need any further diagnostic testing or treatment for that at this point.

## 2020-12-10 DIAGNOSIS — G4733 Obstructive sleep apnea (adult) (pediatric): Secondary | ICD-10-CM | POA: Diagnosis not present

## 2020-12-10 DIAGNOSIS — K592 Neurogenic bowel, not elsewhere classified: Secondary | ICD-10-CM | POA: Diagnosis not present

## 2020-12-10 DIAGNOSIS — G8250 Quadriplegia, unspecified: Secondary | ICD-10-CM | POA: Diagnosis not present

## 2020-12-10 DIAGNOSIS — Z7409 Other reduced mobility: Secondary | ICD-10-CM | POA: Diagnosis not present

## 2020-12-10 DIAGNOSIS — E119 Type 2 diabetes mellitus without complications: Secondary | ICD-10-CM | POA: Diagnosis not present

## 2020-12-10 DIAGNOSIS — E782 Mixed hyperlipidemia: Secondary | ICD-10-CM | POA: Diagnosis not present

## 2020-12-10 DIAGNOSIS — G373 Acute transverse myelitis in demyelinating disease of central nervous system: Secondary | ICD-10-CM | POA: Diagnosis not present

## 2020-12-10 DIAGNOSIS — E1142 Type 2 diabetes mellitus with diabetic polyneuropathy: Secondary | ICD-10-CM | POA: Diagnosis not present

## 2020-12-10 DIAGNOSIS — N390 Urinary tract infection, site not specified: Secondary | ICD-10-CM | POA: Diagnosis not present

## 2020-12-10 DIAGNOSIS — I10 Essential (primary) hypertension: Secondary | ICD-10-CM | POA: Diagnosis not present

## 2020-12-10 DIAGNOSIS — N319 Neuromuscular dysfunction of bladder, unspecified: Secondary | ICD-10-CM | POA: Diagnosis not present

## 2020-12-10 DIAGNOSIS — I2510 Atherosclerotic heart disease of native coronary artery without angina pectoris: Secondary | ICD-10-CM | POA: Diagnosis not present

## 2020-12-11 DIAGNOSIS — T839XXA Unspecified complication of genitourinary prosthetic device, implant and graft, initial encounter: Secondary | ICD-10-CM | POA: Diagnosis not present

## 2020-12-11 DIAGNOSIS — Z931 Gastrostomy status: Secondary | ICD-10-CM | POA: Diagnosis not present

## 2020-12-11 DIAGNOSIS — R194 Change in bowel habit: Secondary | ICD-10-CM | POA: Diagnosis not present

## 2020-12-11 DIAGNOSIS — R110 Nausea: Secondary | ICD-10-CM | POA: Diagnosis not present

## 2020-12-11 DIAGNOSIS — E440 Moderate protein-calorie malnutrition: Secondary | ICD-10-CM | POA: Diagnosis not present

## 2020-12-11 DIAGNOSIS — K9189 Other postprocedural complications and disorders of digestive system: Secondary | ICD-10-CM | POA: Diagnosis not present

## 2020-12-14 DIAGNOSIS — K592 Neurogenic bowel, not elsewhere classified: Secondary | ICD-10-CM | POA: Diagnosis not present

## 2020-12-14 DIAGNOSIS — N390 Urinary tract infection, site not specified: Secondary | ICD-10-CM | POA: Diagnosis not present

## 2020-12-14 DIAGNOSIS — I10 Essential (primary) hypertension: Secondary | ICD-10-CM | POA: Diagnosis not present

## 2020-12-14 DIAGNOSIS — G373 Acute transverse myelitis in demyelinating disease of central nervous system: Secondary | ICD-10-CM | POA: Diagnosis not present

## 2020-12-14 DIAGNOSIS — E782 Mixed hyperlipidemia: Secondary | ICD-10-CM | POA: Diagnosis not present

## 2020-12-14 DIAGNOSIS — Z7409 Other reduced mobility: Secondary | ICD-10-CM | POA: Diagnosis not present

## 2020-12-14 DIAGNOSIS — K5989 Other specified functional intestinal disorders: Secondary | ICD-10-CM | POA: Diagnosis not present

## 2020-12-14 DIAGNOSIS — N319 Neuromuscular dysfunction of bladder, unspecified: Secondary | ICD-10-CM | POA: Diagnosis not present

## 2020-12-14 DIAGNOSIS — I2510 Atherosclerotic heart disease of native coronary artery without angina pectoris: Secondary | ICD-10-CM | POA: Diagnosis not present

## 2020-12-14 DIAGNOSIS — E1142 Type 2 diabetes mellitus with diabetic polyneuropathy: Secondary | ICD-10-CM | POA: Diagnosis not present

## 2020-12-14 DIAGNOSIS — G4733 Obstructive sleep apnea (adult) (pediatric): Secondary | ICD-10-CM | POA: Diagnosis not present

## 2020-12-14 DIAGNOSIS — K5909 Other constipation: Secondary | ICD-10-CM | POA: Diagnosis not present

## 2020-12-14 DIAGNOSIS — E119 Type 2 diabetes mellitus without complications: Secondary | ICD-10-CM | POA: Diagnosis not present

## 2020-12-14 DIAGNOSIS — G8250 Quadriplegia, unspecified: Secondary | ICD-10-CM | POA: Diagnosis not present

## 2020-12-15 DIAGNOSIS — E118 Type 2 diabetes mellitus with unspecified complications: Secondary | ICD-10-CM | POA: Diagnosis not present

## 2020-12-15 DIAGNOSIS — L89513 Pressure ulcer of right ankle, stage 3: Secondary | ICD-10-CM | POA: Diagnosis not present

## 2020-12-15 DIAGNOSIS — L89154 Pressure ulcer of sacral region, stage 4: Secondary | ICD-10-CM | POA: Diagnosis not present

## 2020-12-15 DIAGNOSIS — I10 Essential (primary) hypertension: Secondary | ICD-10-CM | POA: Diagnosis not present

## 2020-12-15 DIAGNOSIS — N319 Neuromuscular dysfunction of bladder, unspecified: Secondary | ICD-10-CM | POA: Diagnosis not present

## 2020-12-15 DIAGNOSIS — L89613 Pressure ulcer of right heel, stage 3: Secondary | ICD-10-CM | POA: Diagnosis not present

## 2020-12-15 DIAGNOSIS — L89214 Pressure ulcer of right hip, stage 4: Secondary | ICD-10-CM | POA: Diagnosis not present

## 2020-12-15 DIAGNOSIS — K592 Neurogenic bowel, not elsewhere classified: Secondary | ICD-10-CM | POA: Diagnosis not present

## 2020-12-17 DIAGNOSIS — K5989 Other specified functional intestinal disorders: Secondary | ICD-10-CM | POA: Diagnosis not present

## 2020-12-17 DIAGNOSIS — Z433 Encounter for attention to colostomy: Secondary | ICD-10-CM | POA: Diagnosis not present

## 2020-12-17 DIAGNOSIS — K5909 Other constipation: Secondary | ICD-10-CM | POA: Diagnosis not present

## 2020-12-21 ENCOUNTER — Other Ambulatory Visit: Payer: Self-pay

## 2020-12-21 ENCOUNTER — Encounter (HOSPITAL_COMMUNITY): Payer: Self-pay | Admitting: Emergency Medicine

## 2020-12-21 ENCOUNTER — Inpatient Hospital Stay (HOSPITAL_COMMUNITY)
Admission: EM | Admit: 2020-12-21 | Discharge: 2020-12-26 | DRG: 698 | Disposition: A | Discharge status: Skilled Nursing Facility | Payer: Medicare Other | Source: Skilled Nursing Facility | Attending: Internal Medicine | Admitting: Internal Medicine

## 2020-12-21 ENCOUNTER — Emergency Department (HOSPITAL_COMMUNITY): Payer: Medicare Other

## 2020-12-21 DIAGNOSIS — N3001 Acute cystitis with hematuria: Secondary | ICD-10-CM | POA: Diagnosis not present

## 2020-12-21 DIAGNOSIS — I10 Essential (primary) hypertension: Secondary | ICD-10-CM | POA: Diagnosis not present

## 2020-12-21 DIAGNOSIS — Z86010 Personal history of colonic polyps: Secondary | ICD-10-CM

## 2020-12-21 DIAGNOSIS — Z87891 Personal history of nicotine dependence: Secondary | ICD-10-CM

## 2020-12-21 DIAGNOSIS — R6520 Severe sepsis without septic shock: Secondary | ICD-10-CM | POA: Diagnosis not present

## 2020-12-21 DIAGNOSIS — Z6827 Body mass index (BMI) 27.0-27.9, adult: Secondary | ICD-10-CM

## 2020-12-21 DIAGNOSIS — M2550 Pain in unspecified joint: Secondary | ICD-10-CM | POA: Diagnosis not present

## 2020-12-21 DIAGNOSIS — Z7401 Bed confinement status: Secondary | ICD-10-CM | POA: Diagnosis not present

## 2020-12-21 DIAGNOSIS — Z880 Allergy status to penicillin: Secondary | ICD-10-CM | POA: Diagnosis not present

## 2020-12-21 DIAGNOSIS — Z9049 Acquired absence of other specified parts of digestive tract: Secondary | ICD-10-CM

## 2020-12-21 DIAGNOSIS — Y846 Urinary catheterization as the cause of abnormal reaction of the patient, or of later complication, without mention of misadventure at the time of the procedure: Secondary | ICD-10-CM | POA: Diagnosis present

## 2020-12-21 DIAGNOSIS — N179 Acute kidney failure, unspecified: Secondary | ICD-10-CM | POA: Diagnosis not present

## 2020-12-21 DIAGNOSIS — R6521 Severe sepsis with septic shock: Secondary | ICD-10-CM | POA: Diagnosis present

## 2020-12-21 DIAGNOSIS — Z7982 Long term (current) use of aspirin: Secondary | ICD-10-CM

## 2020-12-21 DIAGNOSIS — Z800 Family history of malignant neoplasm of digestive organs: Secondary | ICD-10-CM

## 2020-12-21 DIAGNOSIS — Z8249 Family history of ischemic heart disease and other diseases of the circulatory system: Secondary | ICD-10-CM

## 2020-12-21 DIAGNOSIS — I252 Old myocardial infarction: Secondary | ICD-10-CM | POA: Diagnosis not present

## 2020-12-21 DIAGNOSIS — R41841 Cognitive communication deficit: Secondary | ICD-10-CM | POA: Diagnosis not present

## 2020-12-21 DIAGNOSIS — F32A Depression, unspecified: Secondary | ICD-10-CM | POA: Diagnosis present

## 2020-12-21 DIAGNOSIS — I700 Atherosclerosis of aorta: Secondary | ICD-10-CM | POA: Diagnosis not present

## 2020-12-21 DIAGNOSIS — E860 Dehydration: Secondary | ICD-10-CM | POA: Diagnosis present

## 2020-12-21 DIAGNOSIS — R2681 Unsteadiness on feet: Secondary | ICD-10-CM | POA: Diagnosis not present

## 2020-12-21 DIAGNOSIS — G8250 Quadriplegia, unspecified: Secondary | ICD-10-CM | POA: Diagnosis not present

## 2020-12-21 DIAGNOSIS — Z85820 Personal history of malignant melanoma of skin: Secondary | ICD-10-CM | POA: Diagnosis not present

## 2020-12-21 DIAGNOSIS — G373 Acute transverse myelitis in demyelinating disease of central nervous system: Secondary | ICD-10-CM | POA: Diagnosis not present

## 2020-12-21 DIAGNOSIS — Z933 Colostomy status: Secondary | ICD-10-CM

## 2020-12-21 DIAGNOSIS — T83518A Infection and inflammatory reaction due to other urinary catheter, initial encounter: Secondary | ICD-10-CM | POA: Diagnosis not present

## 2020-12-21 DIAGNOSIS — I2510 Atherosclerotic heart disease of native coronary artery without angina pectoris: Secondary | ICD-10-CM | POA: Diagnosis present

## 2020-12-21 DIAGNOSIS — Z825 Family history of asthma and other chronic lower respiratory diseases: Secondary | ICD-10-CM

## 2020-12-21 DIAGNOSIS — A4159 Other Gram-negative sepsis: Secondary | ICD-10-CM | POA: Diagnosis present

## 2020-12-21 DIAGNOSIS — N400 Enlarged prostate without lower urinary tract symptoms: Secondary | ICD-10-CM | POA: Diagnosis present

## 2020-12-21 DIAGNOSIS — E785 Hyperlipidemia, unspecified: Secondary | ICD-10-CM | POA: Diagnosis present

## 2020-12-21 DIAGNOSIS — R109 Unspecified abdominal pain: Secondary | ICD-10-CM | POA: Diagnosis not present

## 2020-12-21 DIAGNOSIS — Z801 Family history of malignant neoplasm of trachea, bronchus and lung: Secondary | ICD-10-CM

## 2020-12-21 DIAGNOSIS — Z955 Presence of coronary angioplasty implant and graft: Secondary | ICD-10-CM

## 2020-12-21 DIAGNOSIS — R1312 Dysphagia, oropharyngeal phase: Secondary | ICD-10-CM | POA: Diagnosis not present

## 2020-12-21 DIAGNOSIS — M47816 Spondylosis without myelopathy or radiculopathy, lumbar region: Secondary | ICD-10-CM | POA: Diagnosis not present

## 2020-12-21 DIAGNOSIS — G8220 Paraplegia, unspecified: Secondary | ICD-10-CM | POA: Diagnosis not present

## 2020-12-21 DIAGNOSIS — E1142 Type 2 diabetes mellitus with diabetic polyneuropathy: Secondary | ICD-10-CM | POA: Diagnosis not present

## 2020-12-21 DIAGNOSIS — K219 Gastro-esophageal reflux disease without esophagitis: Secondary | ICD-10-CM | POA: Diagnosis present

## 2020-12-21 DIAGNOSIS — A419 Sepsis, unspecified organism: Secondary | ICD-10-CM

## 2020-12-21 DIAGNOSIS — G4733 Obstructive sleep apnea (adult) (pediatric): Secondary | ICD-10-CM | POA: Diagnosis present

## 2020-12-21 DIAGNOSIS — I959 Hypotension, unspecified: Secondary | ICD-10-CM | POA: Diagnosis not present

## 2020-12-21 DIAGNOSIS — L89511 Pressure ulcer of right ankle, stage 1: Secondary | ICD-10-CM | POA: Diagnosis present

## 2020-12-21 DIAGNOSIS — R112 Nausea with vomiting, unspecified: Secondary | ICD-10-CM | POA: Diagnosis not present

## 2020-12-21 DIAGNOSIS — Z1629 Resistance to other single specified antibiotic: Secondary | ICD-10-CM | POA: Diagnosis present

## 2020-12-21 DIAGNOSIS — R627 Adult failure to thrive: Secondary | ICD-10-CM | POA: Diagnosis present

## 2020-12-21 DIAGNOSIS — Z79899 Other long term (current) drug therapy: Secondary | ICD-10-CM

## 2020-12-21 DIAGNOSIS — R110 Nausea: Secondary | ICD-10-CM

## 2020-12-21 DIAGNOSIS — M6259 Muscle wasting and atrophy, not elsewhere classified, multiple sites: Secondary | ICD-10-CM | POA: Diagnosis not present

## 2020-12-21 DIAGNOSIS — M6281 Muscle weakness (generalized): Secondary | ICD-10-CM | POA: Diagnosis not present

## 2020-12-21 DIAGNOSIS — Z7984 Long term (current) use of oral hypoglycemic drugs: Secondary | ICD-10-CM

## 2020-12-21 DIAGNOSIS — K5730 Diverticulosis of large intestine without perforation or abscess without bleeding: Secondary | ICD-10-CM | POA: Diagnosis not present

## 2020-12-21 DIAGNOSIS — R532 Functional quadriplegia: Secondary | ICD-10-CM | POA: Diagnosis not present

## 2020-12-21 DIAGNOSIS — E782 Mixed hyperlipidemia: Secondary | ICD-10-CM | POA: Diagnosis not present

## 2020-12-21 DIAGNOSIS — E872 Acidosis: Secondary | ICD-10-CM | POA: Diagnosis present

## 2020-12-21 DIAGNOSIS — L89612 Pressure ulcer of right heel, stage 2: Secondary | ICD-10-CM | POA: Diagnosis present

## 2020-12-21 DIAGNOSIS — K3189 Other diseases of stomach and duodenum: Secondary | ICD-10-CM | POA: Diagnosis not present

## 2020-12-21 DIAGNOSIS — L89154 Pressure ulcer of sacral region, stage 4: Secondary | ICD-10-CM | POA: Diagnosis not present

## 2020-12-21 DIAGNOSIS — R6883 Chills (without fever): Secondary | ICD-10-CM | POA: Diagnosis not present

## 2020-12-21 DIAGNOSIS — E1165 Type 2 diabetes mellitus with hyperglycemia: Secondary | ICD-10-CM | POA: Diagnosis not present

## 2020-12-21 DIAGNOSIS — R1310 Dysphagia, unspecified: Secondary | ICD-10-CM | POA: Diagnosis present

## 2020-12-21 DIAGNOSIS — M869 Osteomyelitis, unspecified: Secondary | ICD-10-CM | POA: Diagnosis not present

## 2020-12-21 DIAGNOSIS — K592 Neurogenic bowel, not elsewhere classified: Secondary | ICD-10-CM | POA: Diagnosis not present

## 2020-12-21 DIAGNOSIS — G360 Neuromyelitis optica [Devic]: Secondary | ICD-10-CM | POA: Diagnosis not present

## 2020-12-21 DIAGNOSIS — Z7409 Other reduced mobility: Secondary | ICD-10-CM | POA: Diagnosis not present

## 2020-12-21 DIAGNOSIS — R404 Transient alteration of awareness: Secondary | ICD-10-CM | POA: Diagnosis not present

## 2020-12-21 DIAGNOSIS — L89890 Pressure ulcer of other site, unstageable: Secondary | ICD-10-CM | POA: Diagnosis present

## 2020-12-21 DIAGNOSIS — L89522 Pressure ulcer of left ankle, stage 2: Secondary | ICD-10-CM | POA: Diagnosis present

## 2020-12-21 DIAGNOSIS — Z20822 Contact with and (suspected) exposure to covid-19: Secondary | ICD-10-CM | POA: Diagnosis not present

## 2020-12-21 DIAGNOSIS — Z794 Long term (current) use of insulin: Secondary | ICD-10-CM

## 2020-12-21 DIAGNOSIS — R54 Age-related physical debility: Secondary | ICD-10-CM | POA: Diagnosis present

## 2020-12-21 DIAGNOSIS — Z881 Allergy status to other antibiotic agents status: Secondary | ICD-10-CM

## 2020-12-21 DIAGNOSIS — N390 Urinary tract infection, site not specified: Secondary | ICD-10-CM

## 2020-12-21 DIAGNOSIS — L89159 Pressure ulcer of sacral region, unspecified stage: Secondary | ICD-10-CM

## 2020-12-21 DIAGNOSIS — L89896 Pressure-induced deep tissue damage of other site: Secondary | ICD-10-CM | POA: Diagnosis present

## 2020-12-21 DIAGNOSIS — E119 Type 2 diabetes mellitus without complications: Secondary | ICD-10-CM | POA: Diagnosis not present

## 2020-12-21 DIAGNOSIS — N319 Neuromuscular dysfunction of bladder, unspecified: Secondary | ICD-10-CM | POA: Diagnosis not present

## 2020-12-21 DIAGNOSIS — R278 Other lack of coordination: Secondary | ICD-10-CM | POA: Diagnosis not present

## 2020-12-21 DIAGNOSIS — L89622 Pressure ulcer of left heel, stage 2: Secondary | ICD-10-CM | POA: Diagnosis present

## 2020-12-21 LAB — URINALYSIS, ROUTINE W REFLEX MICROSCOPIC
Bacteria, UA: NONE SEEN
Bilirubin Urine: NEGATIVE
Glucose, UA: NEGATIVE mg/dL
Ketones, ur: NEGATIVE mg/dL
Nitrite: NEGATIVE
Protein, ur: 100 mg/dL — AB
RBC / HPF: >50 RBC/hpf — ABNORMAL HIGH (ref 0–5)
Specific Gravity, Urine: 1.014 (ref 1.005–1.030)
WBC, UA: >50 WBC/hpf — ABNORMAL HIGH (ref 0–5)
pH: 5 (ref 5.0–8.0)

## 2020-12-21 LAB — LACTIC ACID, PLASMA
Lactic Acid, Venous: 2.2 mmol/L (ref 0.5–1.9)
Lactic Acid, Venous: 3.1 mmol/L (ref 0.5–1.9)

## 2020-12-21 LAB — CBC WITH DIFFERENTIAL/PLATELET
Abs Immature Granulocytes: 0.11 10*3/uL — ABNORMAL HIGH (ref 0.00–0.07)
Basophils Absolute: 0 10*3/uL (ref 0.0–0.1)
Basophils Relative: 0 %
Eosinophils Absolute: 0 10*3/uL (ref 0.0–0.5)
Eosinophils Relative: 0 %
HCT: 30.5 % — ABNORMAL LOW (ref 39.0–52.0)
Hemoglobin: 9.2 g/dL — ABNORMAL LOW (ref 13.0–17.0)
Immature Granulocytes: 1 %
Lymphocytes Relative: 3 %
Lymphs Abs: 0.4 10*3/uL — ABNORMAL LOW (ref 0.7–4.0)
MCH: 23.8 pg — ABNORMAL LOW (ref 26.0–34.0)
MCHC: 30.2 g/dL (ref 30.0–36.0)
MCV: 78.8 fL — ABNORMAL LOW (ref 80.0–100.0)
Monocytes Absolute: 0.5 10*3/uL (ref 0.1–1.0)
Monocytes Relative: 4 %
Neutro Abs: 11.8 10*3/uL — ABNORMAL HIGH (ref 1.7–7.7)
Neutrophils Relative %: 92 %
Platelets: 285 10*3/uL (ref 150–400)
RBC: 3.87 MIL/uL — ABNORMAL LOW (ref 4.22–5.81)
RDW: 19.2 % — ABNORMAL HIGH (ref 11.5–15.5)
WBC Morphology: INCREASED
WBC: 12.8 10*3/uL — ABNORMAL HIGH (ref 4.0–10.5)
nRBC: 0 % (ref 0.0–0.2)

## 2020-12-21 LAB — COMPREHENSIVE METABOLIC PANEL
ALT: 12 U/L (ref 0–44)
AST: 14 U/L — ABNORMAL LOW (ref 15–41)
Albumin: 2.4 g/dL — ABNORMAL LOW (ref 3.5–5.0)
Alkaline Phosphatase: 89 U/L (ref 38–126)
Anion gap: 13 (ref 5–15)
BUN: 60 mg/dL — ABNORMAL HIGH (ref 8–23)
CO2: 23 mmol/L (ref 22–32)
Calcium: 8 mg/dL — ABNORMAL LOW (ref 8.9–10.3)
Chloride: 99 mmol/L (ref 98–111)
Creatinine, Ser: 3.69 mg/dL — ABNORMAL HIGH (ref 0.61–1.24)
GFR, Estimated: 16 mL/min — ABNORMAL LOW (ref >60)
Glucose, Bld: 216 mg/dL — ABNORMAL HIGH (ref 70–99)
Potassium: 4.3 mmol/L (ref 3.5–5.1)
Sodium: 135 mmol/L (ref 135–145)
Total Bilirubin: 0.7 mg/dL (ref 0.3–1.2)
Total Protein: 6 g/dL — ABNORMAL LOW (ref 6.5–8.1)

## 2020-12-21 LAB — PROTIME-INR
INR: 1.2 (ref 0.8–1.2)
Prothrombin Time: 15.2 seconds (ref 11.4–15.2)

## 2020-12-21 MED ORDER — SODIUM CHLORIDE 0.9 % IV SOLN: Freq: Once | INTRAVENOUS | Status: AC

## 2020-12-21 MED ORDER — SODIUM CHLORIDE 0.9 % IV BOLUS
1000.0000 mL | Freq: Once | INTRAVENOUS | Status: AC
  Administered 2020-12-21: 1000 mL via INTRAVENOUS

## 2020-12-21 MED ORDER — SODIUM CHLORIDE 0.9 % IV SOLN
2.0000 g | Freq: Once | INTRAVENOUS | Status: AC
  Administered 2020-12-21: 2 g via INTRAVENOUS
  Filled 2020-12-21: qty 2

## 2020-12-21 MED ORDER — ACETAMINOPHEN 325 MG PO TABS
650.0000 mg | ORAL_TABLET | Freq: Four times a day (QID) | ORAL | Status: DC | PRN
  Administered 2020-12-23: 650 mg via ORAL
  Filled 2020-12-21 (×2): qty 2

## 2020-12-21 MED ORDER — SODIUM CHLORIDE 0.9 % IV SOLN: INTRAVENOUS | Status: AC

## 2020-12-21 MED ORDER — HEPARIN SODIUM (PORCINE) 5000 UNIT/ML IJ SOLN
5000.0000 [IU] | Freq: Three times a day (TID) | INTRAMUSCULAR | Status: DC
  Administered 2020-12-22 – 2020-12-26 (×12): 5000 [IU] via SUBCUTANEOUS
  Filled 2020-12-21 (×10): qty 1

## 2020-12-21 MED ORDER — ACETAMINOPHEN 650 MG RE SUPP: 650.0000 mg | Freq: Four times a day (QID) | RECTAL | Status: DC | PRN

## 2020-12-21 MED ORDER — INSULIN ASPART 100 UNIT/ML ~~LOC~~ SOLN
0.0000 [IU] | Freq: Every day | SUBCUTANEOUS | Status: DC
  Filled 2020-12-21: qty 0.05

## 2020-12-21 MED ORDER — SODIUM CHLORIDE 0.9 % IV SOLN
2.0000 g | INTRAVENOUS | Status: DC
  Filled 2020-12-21: qty 2

## 2020-12-21 MED ORDER — ACETAMINOPHEN 325 MG PO TABS
650.0000 mg | ORAL_TABLET | Freq: Once | ORAL | Status: AC
  Administered 2020-12-21: 650 mg via ORAL
  Filled 2020-12-21: qty 2

## 2020-12-21 MED ORDER — INSULIN ASPART 100 UNIT/ML ~~LOC~~ SOLN
0.0000 [IU] | Freq: Three times a day (TID) | SUBCUTANEOUS | Status: DC
  Filled 2020-12-21: qty 0.15

## 2020-12-21 MED ORDER — ONDANSETRON HCL 4 MG/2ML IJ SOLN
4.0000 mg | Freq: Once | INTRAMUSCULAR | Status: AC
  Administered 2020-12-21: 4 mg via INTRAVENOUS
  Filled 2020-12-21: qty 2

## 2020-12-21 MED ORDER — SODIUM CHLORIDE 0.9 % IV BOLUS
30.0000 mL/kg | Freq: Once | INTRAVENOUS | Status: AC
  Administered 2020-12-21: 1000 mL via INTRAVENOUS

## 2020-12-21 MED ORDER — ONDANSETRON HCL 4 MG/2ML IJ SOLN
4.0000 mg | Freq: Four times a day (QID) | INTRAMUSCULAR | Status: DC | PRN
  Administered 2020-12-22 – 2020-12-24 (×3): 4 mg via INTRAVENOUS
  Filled 2020-12-21 (×4): qty 2

## 2020-12-21 NOTE — ED Triage Notes (Signed)
Per pt, states he has not been able eat or drink since Friday-denies pain-rehab facility thinks he might have an infection

## 2020-12-21 NOTE — ED Provider Notes (Signed)
[facility] DEPT Provider Note   CSN: [id number] Arrival date & time: 12/21/20  1548     History Chief Complaint  Patient presents with  . Hypotension  . Failure To Thrive    Dresean Beckel. is a 75 y.o. male.  Revere Maahs. was admitted to the hospital between 11/16/2020 and 11/22/2020.  He had urosepsis secondary to Proteus mirabilis.  He was discharged home on amoxicillin and has seen ID in follow-up.  For the last 3 days, he has had very little to eat or drink secondary to feeling quite nauseated.  No vomiting, but he has had decreased urinary output as well as decreased ostomy output.  The history is provided by the patient.  GI Problem This is a new problem. The current episode started more than 2 days ago (Began 12/18/20). The problem occurs constantly. The problem has not changed since onset.Pertinent negatives include no chest pain, no abdominal pain, no headaches and no shortness of breath. Associated symptoms comments: Cannot feel from the waist down, and pain is not entirely reliable. Nothing aggravates the symptoms. Nothing relieves the symptoms. He has tried nothing for the symptoms. The treatment provided no relief.       Past Medical History:  Diagnosis Date  . Allergy   . CAD (coronary artery disease)    a. angioplasty of his RCA in 1990. b. bare metal stent placed in the RCA in 2000 followed by rotational atherectomy shortly after for stent restenosis. c. last cath was in 2012 showing stable moderate diffuse CAD. (70% mid LAD, 80% diagonal, 70% Ramus, 40% mid to distal RCA stent restenosis). d. Low risk nuc in 2015.  . Diabetes mellitus   . Diverticulosis   . Elevated CK   . Erectile dysfunction   . Hemorrhoids   . HTN (hypertension)   . Hyperlipidemia   . Hypertriglyceridemia   . Malignant melanoma of left side of neck ([place]) 10/25/2018  . Myocardial infarction ([place])   . Obesity   . OSA (obstructive sleep apnea)   .  Persistent disorder of initiating or maintaining sleep   . Personal history of colonic polyps 02/05/2003    Patient Active Problem List   Diagnosis Date Noted  . UTI (urinary tract infection) 11/24/2020  . Hemorrhagic cystitis   . Encounter for monitoring immunomodulating therapy 08/20/2020  . High risk medication use 08/20/2020  . Psoas abscess, right ([place]) 06/05/2020  . Gram-negative bacteremia 06/05/2020  . Malnutrition of moderate degree 06/04/2020  . Lumbar discitis 06/02/2020  . Palliative care by specialist   . Goals of care, counseling/discussion   . FTT (failure to thrive) in adult 06/01/2020  . Abdominal pain   . Sacral ulcer, with necrosis of muscle ([place]) 04/09/2020  . 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 Transverse myelitis ([place])   . Labile blood pressure   . Labile blood glucose   . Neurogenic bladder   . Neurogenic bowel   . Acute blood loss anemia   . Transaminitis   . Controlled type 2 diabetes mellitus with hyperglycemia, with long-term current use of insulin ([place])   . Dyslipidemia   . HCAP (healthcare-associated pneumonia)   . Paraplegia ([place])   . Coronary artery disease involving native coronary artery of native heart without angina pectoris   . Steroid-induced hyperglycemia   . Diabetic peripheral neuropathy ([place])   . Hyponatremia   . Weakness 03/09/2020  . Chest pain 03/08/2020  . Right leg numbness   .  Malignant melanoma of left side of neck ([place]) 10/25/2018  . Facial neuritis 10/25/2018  . Myofascial pain syndrome 10/25/2018  . Occipital neuralgia of left side 10/25/2018  . CAP (community acquired pneumonia) 09/08/2017  . Male hypogonadism 06/20/2016  . Renal lesion 06/20/2016  . Insomnia 08/07/2015  . Enlarged prostate with lower urinary tract symptoms (LUTS) 11/07/2014  . Balanitis 07/30/2012  . Bladder neck obstruction 07/30/2012  . Prostate nodule 06/18/2012  . Recurrent nephrolithiasis 06/18/2012  . Benign neoplasm of colon 08/29/2011  .  DEPRESSION, SITUATIONAL, ACUTE 01/23/2010  . Essential hypertension 05/30/2009  . Coronary atherosclerosis 05/30/2009  . Obstructive sleep apnea 11/28/2008  . OBESITY 09/23/2008  . ERECTILE DYSFUNCTION 11/26/2007  . Well controlled type 2 diabetes mellitus with peripheral circulatory disorder ([place]) 05/09/2007  . Hyperlipidemia 05/09/2007  . MYOCARDIAL INFARCTION, HX OF 05/09/2007  . DIVERTICULOSIS, COLON 05/09/2007    Past Surgical History:  Procedure Laterality Date  . CHOLECYSTECTOMY    . CORONARY ANGIOPLASTY    . CORONARY STENT PLACEMENT     stenting of the right coronary artery with followup rotational  atherectomy. (3 stents placed)  . FINGER SURGERY     right  . FOOT SURGERY     right  . INGUINAL HERNIA REPAIR     right  . IRRIGATION AND DEBRIDEMENT ABSCESS N/A 06/04/2020   Procedure: IRRIGATION AND DEBRIDEMENT SACRAL WOUND;  Surgeon: Jesusita Oka, MD;  Location: [facility];  Service: General;  Laterality: N/A;  . LAPAROSCOPY N/A 06/04/2020   Procedure: LAPAROSCOPY ASSISTED COLOSTOMY, LAPAROSCOPY GASTROSTOMY;  Surgeon: Jesusita Oka, MD;  Location: [facility];  Service: General;  Laterality: N/A;  . LYSIS OF ADHESION N/A 06/04/2020   Procedure: LYSIS OF ADHESION;  Surgeon: Jesusita Oka, MD;  Location: [facility];  Service: General;  Laterality: N/A;  . melanoma removal     neck  . ORTHOPEDIC SURGERY     foot right  . rotator cuff surg     Bil       Family History  Problem Relation Age of Onset  . Liver cancer Father   . Lung cancer Father   . Heart disease Father   . Heart disease Sister   . Lung cancer Mother   . COPD Mother   . Obesity Mother   . Colon cancer Neg Hx   . Stomach cancer Neg Hx   . Pancreatic cancer Neg Hx     Social History   Tobacco Use  . Smoking status: Former Smoker    Packs/day: 1.50    Years: 30.00    Pack years: 45.00    Types: Cigarettes    Quit date: 09/19/1980    Years since quitting: 40.2  . Smokeless tobacco: Never Used   Vaping Use  . Vaping Use: Never used  Substance Use Topics  . Alcohol use: Not Currently    Alcohol/week: 7.0 - 14.0 standard drinks    Types: 7 - 14 Shots of liquor per week  . Drug use: No    Home Medications Prior to Admission medications   Medication Sig Start Date End Date Taking? Authorizing Provider  acetaminophen (TYLENOL) 325 MG tablet Take 2 tablets (650 mg total) by mouth every 6 (six) hours as needed for headache. 04/24/20   Love, Ivan Anchors, PA-C  albuterol (PROVENTIL) (2.5 MG/3ML) 0.083% nebulizer solution Take 3 mLs (2.5 mg total) by nebulization once as needed for wheezing or shortness of breath (or anaphylaxis due to Rituxan infusion). 04/24/20   Love,  Ivan Anchors, PA-C  antiseptic oral rinse (BIOTENE) LIQD 10 mLs by Mouth Rinse route 2 (two) times daily. Rinse and spit    [provider]  Ascorbic Acid (VITA-C PO) Take 500 mg by mouth daily.    [provider]  aspirin EC 81 MG tablet Take 81 mg by mouth at bedtime. Swallow whole.    [provider]  atorvastatin (LIPITOR) 20 MG tablet Take 1 tablet (20 mg total) by mouth daily. 03/26/20   Shelly Coss, MD  cholestyramine (QUESTRAN) 4 g packet Take 4 g by mouth daily.    [provider]  collagenase (SANTYL) ointment Apply topically daily. 11/23/20   Dana Allan I, MD  ferrous sulfate 300 (60 Fe) MG/5ML syrup Take 300 mg by mouth daily.    [provider]  fluticasone (FLONASE) 50 MCG/ACT nasal spray Place 2 sprays into both nostrils 2 (two) times daily. 04/24/20   Love, Ivan Anchors, PA-C  gabapentin (NEURONTIN) 300 MG capsule Place 1 capsule (300 mg total) into feeding tube at bedtime. Patient taking differently: Take 300 mg by mouth 3 (three) times daily. 06/10/20   Rai, Ripudeep K, MD  hydrogen peroxide ([employer] PEROXIDE SORE MOUTH CLEANS) 1.5 % SOLN Apply topically 2 times daily at 12 noon and 4 pm. 10 mL, mucous membrane, twice a day, rinse and spit    [provider]   Icosapent Ethyl (VASCEPA) 1 g CAPS Take 2 capsules (2 g total) by mouth 2 (two) times daily. 07/19/19   Dunn, Lisbeth Renshaw N, PA-C  insulin aspart (NOVOLOG) 100 UNIT/ML injection Inject 0-15 Units into the skin every 4 (four) hours. Sliding scale  CBG 70 - 120: 0 units: CBG 121 - 150: 2 units; CBG 151 - 200: 3 units; CBG 201 - 250: 5 units; CBG 251 - 300: 8 units;CBG 301 - 350: 11 units; CBG 351 - 400: 15 units; CBG > 400 : 15 units and notify MD Patient taking differently: Inject 0-15 Units into the skin every 4 (four) hours. Sliding scale  CBG 70 - 200: 0 units: CBG 201 - 250: 2 units; CBG 251 - 300: 4 units; CBG 301 - 350: 6 units; CBG 351 - 400: 8 units;CBG 401 - 450: 10 units; CBG 451 - 700: 15units and notify MD 06/10/20   Rai, Ripudeep K, MD  insulin glargine (LANTUS) 100 unit/mL SOPN Inject 15 Units into the skin at bedtime. Patient taking differently: Inject 42 Units into the skin at bedtime. 06/10/20   Rai, Vernelle Emerald, MD  lactulose (CHRONULAC) 10 GM/15ML solution Take 15 g by mouth as needed for mild constipation.    [provider]  Lidocaine 4 % PTCH Apply 1 patch topically daily. Apply to right hip in the morning remove at night    [provider]  lisinopril (ZESTRIL) 10 MG tablet Take 10 mg by mouth daily. 11/03/20   [provider]  Menthol, Topical Analgesic, (BIOFREEZE) 4 % GEL Apply 1 application topically in the morning, at noon, and at bedtime.     [provider]  metFORMIN (GLUCOPHAGE) 1000 MG tablet Take 1,000 mg by mouth daily.    [provider]  nutrition supplement, JUVEN, (JUVEN) PACK Place 1 packet into feeding tube 2 (two) times daily between meals. 11/22/20 12/22/20  Bonnell Public, MD  Nutritional Supplements (FEEDING SUPPLEMENT, PROSOURCE TF,) liquid Place 45 mLs into feeding tube daily. 11/22/20 12/22/20  Dana Allan I, MD  pantoprazole (PROTONIX) 40 MG tablet Take 40 mg  by mouth 2 (two) times daily. 10/19/20   [provider]  Polyethyl Glycol-Propyl Glycol (SYSTANE) 0.4-0.3 % SOLN Place 2 drops into both eyes 2 (two) times daily.    [provider]  polyvinyl alcohol (LIQUIFILM TEARS) 1.4 % ophthalmic solution Place 2 drops into both eyes as needed for dry eyes.    [provider]  senna-docusate (SENOKOT-S) 8.6-50 MG tablet Take 1 tablet by mouth daily.    [provider]  sertraline (ZOLOFT) 25 MG tablet Take 25 mg by mouth daily. Give with 50mg  dose 11/10/20   [provider]  sertraline (ZOLOFT) 50 MG tablet Take 50 mg by mouth daily. Give with 25mg  dose 11/10/20   [provider]  tamsulosin (FLOMAX) 0.4 MG CAPS capsule Take 1 capsule (0.4 mg total) by mouth daily after supper. 06/10/20   Rai, Vernelle Emerald, MD  traZODone (DESYREL) 50 MG tablet Take 50 mg by mouth at bedtime.    [provider]  vitamin B-12 (CYANOCOBALAMIN) 500 MCG tablet Take 500 mcg by mouth daily.    [provider]  Water For Irrigation, Sterile (FREE WATER) SOLN Place 150 mLs into feeding tube 4 (four) times daily. 11/22/20 12/22/20  Dana Allan I, MD  zinc sulfate 220 (50 Zn) MG capsule Place 1 capsule (220 mg total) into feeding tube daily. Patient taking differently: Take 220 mg by mouth daily. 06/10/20   Rai, Vernelle Emerald, MD    Allergies    Levofloxacin  Review of Systems   Review of Systems  Constitutional: Negative for chills and fever.  HENT: Negative for ear pain and sore throat.   Eyes: Negative for pain and visual disturbance.  Respiratory: Negative for cough and shortness of breath.   Cardiovascular: Negative for chest pain and palpitations.  Gastrointestinal: Positive for nausea. Negative for abdominal pain and vomiting.  Genitourinary: Negative for dysuria and hematuria.  Musculoskeletal: Negative for arthralgias and back pain.  Skin: Negative for color change and rash.  Neurological: Negative for seizures, syncope and headaches.  All other  systems reviewed and are negative.   Physical Exam Updated Vital Signs BP (!) 94/49   Pulse 95   Temp 98.4 F (36.9 C) (Oral)   Resp (!) 26   SpO2 99%   Physical Exam Vitals and nursing note reviewed.  Constitutional:      Appearance: He is well-developed.  HENT:     Head: Normocephalic and atraumatic.     Mouth/Throat:     Mouth: Mucous membranes are dry.  Eyes:     Conjunctiva/sclera: Conjunctivae normal.  Cardiovascular:     Rate and Rhythm: Normal rate and regular rhythm.     Heart sounds: No murmur heard.   Pulmonary:     Effort: Pulmonary effort is normal. No respiratory distress.     Breath sounds: Normal breath sounds.  Abdominal:     Palpations: Abdomen is soft.     Tenderness: There is no abdominal tenderness.  Genitourinary:    Comments: There is a sacral decubitus ulcer that is covered in wet to dry dressing as well as an occlusive dressing.  It appears to be healing well, and there is no obvious infection Musculoskeletal:     Cervical back: Neck supple.     Comments: He has a very shallow wound at the posterior aspect of the left foot over the calcaneus.  He also has some wounds over the right foot also over the calcaneus but also over the midpoint of the  Achilles tendon.  There is a little bit of erythematous streaking up the distal aspect of the posterior calf on the right.  Skin:    General: Skin is warm and dry.  Neurological:     Mental Status: He is alert. Mental status is at baseline.  Psychiatric:        Mood and Affect: Mood normal.     ED Results / Procedures / Treatments   Labs (all labs ordered are listed, but only abnormal results are displayed) Labs Reviewed  COMPREHENSIVE METABOLIC PANEL - Abnormal; Notable for the following components:      Result Value   Glucose, Bld 216 (*)    BUN 60 (*)    Creatinine, Ser 3.69 (*)    Calcium 8.0 (*)    Total Protein 6.0 (*)    Albumin 2.4 (*)    AST 14 (*)    GFR, Estimated 16 (*)    All  other components within normal limits  LACTIC ACID, PLASMA - Abnormal; Notable for the following components:   Lactic Acid, Venous 2.2 (*)    All other components within normal limits  LACTIC ACID, PLASMA - Abnormal; Notable for the following components:   Lactic Acid, Venous 3.1 (*)    All other components within normal limits  CBC WITH DIFFERENTIAL/PLATELET - Abnormal; Notable for the following components:   WBC 12.8 (*)    RBC 3.87 (*)    Hemoglobin 9.2 (*)    HCT 30.5 (*)    MCV 78.8 (*)    MCH 23.8 (*)    RDW 19.2 (*)    Neutro Abs 11.8 (*)    Lymphs Abs 0.4 (*)    Abs Immature Granulocytes 0.11 (*)    All other components within normal limits  URINALYSIS, ROUTINE W REFLEX MICROSCOPIC - Abnormal; Notable for the following components:   APPearance TURBID (*)    Hgb urine dipstick LARGE (*)    Protein, ur 100 (*)    Leukocytes,Ua MODERATE (*)    RBC / HPF >50 (*)    WBC, UA >50 (*)    All other components within normal limits  CULTURE, BLOOD (ROUTINE X 2)  CULTURE, BLOOD (ROUTINE X 2)  SARS CORONAVIRUS 2 (TAT 6-24 HRS)  PROTIME-INR    EKG None  Radiology DG Chest 2 View  Result Date: 12/21/2020 CLINICAL DATA:  Suspected sepsis EXAM: CHEST - 2 VIEW COMPARISON:  11/16/2020 FINDINGS: Lingular opacity is unchanged, likely scarring. Right lung clear. Heart is normal size. No effusions or acute bony abnormality. IMPRESSION: Lingular scarring.  No active disease. Electronically Signed   By: Rolm Baptise M.D.   On: 12/21/2020 17:33   CT Renal Stone Study  Result Date: 12/21/2020 CLINICAL DATA:  Nausea. Decreased output through colostomy and decreased urine output. EXAM: CT ABDOMEN AND PELVIS WITHOUT CONTRAST TECHNIQUE: Multidetector CT imaging of the abdomen and pelvis was performed following the standard protocol without IV contrast. COMPARISON:  11/16/2020 FINDINGS: Lower chest: No acute abnormality. Dense coronary artery calcifications in the visualized right coronary artery.  Hepatobiliary: No focal liver abnormality is seen. Status post cholecystectomy. No biliary dilatation. Pancreas: No focal abnormality or ductal dilatation. Spleen: No focal abnormality.  Normal size. Adrenals/Urinary Tract: Large cyst in the upper pole of the left kidney with layering high-density material, likely milk of calcium. No hydronephrosis. Adrenal glands unremarkable. Foley catheter is in place with decompressed urinary bladder. Bilateral perinephric stranding is stable since prior study. Stomach/Bowel: Left lower quadrant ostomy noted.  No evidence of bowel obstruction. Stomach and small bowel decompressed. Gastrostomy tube within the stomach. Scattered colonic diverticula. Vascular/Lymphatic: Diffuse aortoiliac atherosclerosis. No evidence of aneurysm or adenopathy. Reproductive: No visible focal abnormality. Other: No free fluid or free air. Musculoskeletal: No acute bony abnormality. Diffuse degenerative changes in the lumbar spine. IMPRESSION: No ureteral stones or hydronephrosis. Foley catheter is within the bladder which is decompressed. Left lower quadrant ostomy noted.  No evidence of bowel obstruction. Colonic diverticulosis. Aortoiliac atherosclerosis. Electronically Signed   By: Rolm Baptise M.D.   On: 12/21/2020 20:34    Procedures .Critical Care Performed by: Arnaldo Natal, MD Authorized by: Arnaldo Natal, MD   Critical care provider statement:    Critical care time (minutes):  30   Critical care time was exclusive of:  Separately billable procedures and treating other patients and teaching time   Critical care was necessary to treat or prevent imminent or life-threatening deterioration of the following conditions:  Circulatory failure, dehydration, renal failure, sepsis and shock   Critical care was time spent personally by me on the following activities:  Development of treatment plan with patient or surrogate, discussions with consultants, evaluation of patient's response to  treatment, examination of patient, obtaining history from patient or surrogate, ordering and performing treatments and interventions, ordering and review of laboratory studies, ordering and review of radiographic studies, pulse oximetry, re-evaluation of patient's condition and review of old charts   I assumed direction of critical care for this patient from another provider in my specialty: no     Care discussed with: admitting provider       Medications Ordered in ED Medications  ondansetron (ZOFRAN) injection 4 mg (has no administration in time range)  sodium chloride 0.9 % bolus 1,000 mL (has no administration in time range)    ED Course  I have reviewed the triage vital signs and the nursing notes.  Pertinent labs & imaging results that were available during my care of the patient were reviewed by me and considered in my medical decision making (see chart for details).  Clinical Course as of 12/21/20 2201  Mon Dec 21, 2020  2152 I spoke with Dr. Marlowe Sax. [AW]    Clinical Course User Index [AW] Arnaldo Natal, MD   MDM Rules/Calculators/A&P                         Daine Gip. presents with nausea, decreased p.o. intake, hypotension, and tachycardia.  It appears that he is possibly septic from a urinary source, and he was treated with IV antibiotics and IV fluids.  He also has acute kidney injury, and there is no obvious renal obstruction.  He will be admitted for further management. Final Clinical Impression(s) / ED Diagnoses Final diagnoses:  AKI (acute kidney injury) ([place])  Lower urinary tract infectious disease  Sepsis with acute renal failure and septic shock, due to unspecified organism, unspecified acute renal failure type [facility])    Rx / DC Orders ED Discharge Orders    None       Arnaldo Natal, MD 12/21/20 2201

## 2020-12-21 NOTE — ED Notes (Signed)
Patient transported to CT 

## 2020-12-21 NOTE — Progress Notes (Signed)
Pharmacy Antibiotic Note  Brent [name]. is a 75 y.o. male admitted on 12/21/2020 with UTI. Pt recently admitted with urosepsis secondary to proteus mirabilis. Pharmacy has been consulted for cefepime dosing.  Plan: Cefepime 2gm IV q24h Follow renal function, cultures and clinical course     Temp (24hrs), Avg:98.6 F (37 C), Min:98.4 F (36.9 C), Max:98.8 F (37.1 C)  Recent Labs  Lab 12/21/20 1640 12/21/20 1650 12/21/20 1928  WBC  --  12.8*  --   CREATININE  --  3.69*  --   LATICACIDVEN 2.2*  --  3.1*    CrCl cannot be calculated (Unknown ideal weight.).    Allergies  Allergen Reactions  . Levofloxacin Rash  . Penicillins Hives and Rash    Tolerated Unasyn, cefepime and cephalexin    Antimicrobials this admission: 4/4 cefepime >> Dose adjustments this admission:   Microbiology results: 4/4 BCx:   Thank you for allowing pharmacy to be a part of this patient's care.  Dolly Rias RPh 12/21/2020, 11:01 PM

## 2020-12-21 NOTE — ED Notes (Signed)
Assumed care of patient.

## 2020-12-21 NOTE — ED Notes (Signed)
Patient transported to X-ray 

## 2020-12-21 NOTE — ED Notes (Signed)
Patient states pain has gotten better.

## 2020-12-21 NOTE — H&P (Incomplete)
History and Physical    Brent [name]. CZY:[id number] DOB: [birth year] DOA: 12/21/2020  PCP: [name], [name], MD Patient coming from: [facility]  Chief Complaint: Nausea, dry heaves, decreased stool and urine output  HPI: [name]. is a 75 y.o. male with medical history significant of paraplegia secondary to transverse myelitis, dysphagia status post G-tube placement, stage IV sacral pressure ulcer with left gluteal abscess and L4-L5 osteomyelitis and right psoas abscess in 2021 and followed by infectious disease, status post diverting loop colostomy, CAD, insulin-dependent type 2 diabetes, hypertension, hyperlipidemia, history of malignant melanoma, obesity, OSA, hospital admission a month ago for UTI (culture grew Proteus mirabilis) and hemorrhagic cystitis in the setting of indwelling Foley catheter followed by urology.  Patient was sent to the ED today from his rehab facility for evaluation of nausea and dry heaves for the past 3 days along with decreased ostomy output and urine output.  In the ED, afebrile.  Hypotensive with systolic in the 09N.  Slightly tachycardic.  Labs showing WBC 12.8, hemoglobin 9.2 (stable), platelet count 285K.  Sodium 135, potassium 4.3, chloride 99, bicarb 23, BUN 60, creatinine 3.6 (baseline 0.3), glucose 216.  No elevation of LFTs.  Lactic acid 2.2 >3.1.  INR 1.2.  Blood culture x2 pending.  His urine appeared grossly infected with pus and urinalysis showing moderate amount of leukocytes and greater than 50 WBCs.  Covid PCR pending.  Chest x-ray not suggestive of pneumonia.  CT renal stone study without findings to suggest obstructive uropathy; no evidence of bowel obstruction. Patient was given Tylenol, Zofran, cefepime, and 30 cc per cake fluid boluses per sepsis protocol.  ED Course: ***  Review of Systems:  All systems reviewed and apart from history of presenting illness, are negative.  Past Medical History:  Diagnosis Date   . Allergy   . CAD (coronary artery disease)    a. angioplasty of his RCA in 1990. b. bare metal stent placed in the RCA in 2000 followed by rotational atherectomy shortly after for stent restenosis. c. last cath was in 2012 showing stable moderate diffuse CAD. (70% mid LAD, 80% diagonal, 70% Ramus, 40% mid to distal RCA stent restenosis). d. Low risk nuc in 2015.  . Diabetes mellitus   . Diverticulosis   . Elevated CK   . Erectile dysfunction   . Hemorrhoids   . HTN (hypertension)   . Hyperlipidemia   . Hypertriglyceridemia   . Malignant melanoma of left side of neck ([place]) 10/25/2018  . Myocardial infarction ([place])   . Obesity   . OSA (obstructive sleep apnea)   . Persistent disorder of initiating or maintaining sleep   . Personal history of colonic polyps 02/05/2003    Past Surgical History:  Procedure Laterality Date  . CHOLECYSTECTOMY    . CORONARY ANGIOPLASTY    . CORONARY STENT PLACEMENT     stenting of the right coronary artery with followup rotational  atherectomy. (3 stents placed)  . FINGER SURGERY     right  . FOOT SURGERY     right  . INGUINAL HERNIA REPAIR     right  . IRRIGATION AND DEBRIDEMENT ABSCESS N/A 06/04/2020   Procedure: IRRIGATION AND DEBRIDEMENT SACRAL WOUND;  Surgeon: Jesusita Oka, MD;  Location: [facility];  Service: General;  Laterality: N/A;  . LAPAROSCOPY N/A 06/04/2020   Procedure: LAPAROSCOPY ASSISTED COLOSTOMY, LAPAROSCOPY GASTROSTOMY;  Surgeon: Jesusita Oka, MD;  Location: [facility];  Service: General;  Laterality:  N/A;  . LYSIS OF ADHESION N/A 06/04/2020   Procedure: LYSIS OF ADHESION;  Surgeon: Jesusita Oka, MD;  Location: [facility];  Service: General;  Laterality: N/A;  . melanoma removal     neck  . ORTHOPEDIC SURGERY     foot right  . rotator cuff surg     [place]     reports that he quit smoking about 40 years ago. His smoking use included cigarettes. He has a 45.00 pack-year smoking history. He has never used smokeless tobacco. He reports  previous alcohol use of about 7.0 - 14.0 standard drinks of alcohol per week. He reports that he does not use drugs.  Allergies  Allergen Reactions  . Levofloxacin Rash  . Penicillins Hives and Rash    Tolerated Unasyn, cefepime and cephalexin    Family History  Problem Relation Age of Onset  . Liver cancer Father   . Lung cancer Father   . Heart disease Father   . Heart disease Sister   . Lung cancer Mother   . COPD Mother   . Obesity Mother   . Colon cancer Neg Hx   . Stomach cancer Neg Hx   . Pancreatic cancer Neg Hx     Prior to Admission medications   Medication Sig Start Date End Date Taking? Authorizing Provider  acetaminophen (TYLENOL) 325 MG tablet Take 2 tablets (650 mg total) by mouth every 6 (six) hours as needed for headache. 04/24/20   Love, Ivan Anchors, PA-C  albuterol (PROVENTIL) (2.5 MG/3ML) 0.083% nebulizer solution Take 3 mLs (2.5 mg total) by nebulization once as needed for wheezing or shortness of breath (or anaphylaxis due to Rituxan infusion). 04/24/20   Love, Ivan Anchors, PA-C  antiseptic oral rinse (BIOTENE) LIQD 10 mLs by Mouth Rinse route 2 (two) times daily. Rinse and spit    [provider]  Ascorbic Acid (VITA-C PO) Take 500 mg by mouth daily.    [provider]  aspirin EC 81 MG tablet Take 81 mg by mouth at bedtime. Swallow whole.    [provider]  atorvastatin (LIPITOR) 20 MG tablet Take 1 tablet (20 mg total) by mouth daily. 03/26/20   Shelly Coss, MD  cholestyramine (QUESTRAN) 4 g packet Take 4 g by mouth daily.    [provider]  collagenase (SANTYL) ointment Apply topically daily. 11/23/20   Dana Allan I, MD  ferrous sulfate 300 (60 Fe) MG/5ML syrup Take 300 mg by mouth daily.    [provider]  fluticasone (FLONASE) 50 MCG/ACT nasal spray Place 2 sprays into both nostrils 2 (two) times daily. 04/24/20   Love, Ivan Anchors, PA-C  gabapentin (NEURONTIN) 300 MG capsule Place 1 capsule (300 mg total) into  feeding tube at bedtime. Patient taking differently: Take 300 mg by mouth 3 (three) times daily. 06/10/20   Rai, Ripudeep K, MD  hydrogen peroxide ([employer] PEROXIDE SORE MOUTH CLEANS) 1.5 % SOLN Apply topically 2 times daily at 12 noon and 4 pm. 10 mL, mucous membrane, twice a day, rinse and spit    [provider]  Icosapent Ethyl (VASCEPA) 1 g CAPS Take 2 capsules (2 g total) by mouth 2 (two) times daily. 07/19/19   Dunn, Lisbeth Renshaw N, PA-C  insulin aspart (NOVOLOG) 100 UNIT/ML injection Inject 0-15 Units into the skin every 4 (four) hours. Sliding scale  CBG 70 - 120: 0 units: CBG 121 - 150: 2 units; CBG 151 - 200: 3 units; CBG 201 - 250: 5 units;  CBG 251 - 300: 8 units;CBG 301 - 350: 11 units; CBG 351 - 400: 15 units; CBG > 400 : 15 units and notify MD Patient taking differently: Inject 0-15 Units into the skin every 4 (four) hours. Sliding scale  CBG 70 - 200: 0 units: CBG 201 - 250: 2 units; CBG 251 - 300: 4 units; CBG 301 - 350: 6 units; CBG 351 - 400: 8 units;CBG 401 - 450: 10 units; CBG 451 - 700: 15units and notify MD 06/10/20   Rai, Ripudeep K, MD  insulin glargine (LANTUS) 100 unit/mL SOPN Inject 15 Units into the skin at bedtime. Patient taking differently: Inject 42 Units into the skin at bedtime. 06/10/20   Rai, Vernelle Emerald, MD  lactulose (CHRONULAC) 10 GM/15ML solution Take 15 g by mouth as needed for mild constipation.    [provider]  Lidocaine 4 % PTCH Apply 1 patch topically daily. Apply to right hip in the morning remove at night    [provider]  lisinopril (ZESTRIL) 10 MG tablet Take 10 mg by mouth daily. 11/03/20   [provider]  Menthol, Topical Analgesic, (BIOFREEZE) 4 % GEL Apply 1 application topically in the morning, at noon, and at bedtime.     [provider]  metFORMIN (GLUCOPHAGE) 1000 MG tablet Take 1,000 mg by mouth daily.    [provider]  nutrition supplement, JUVEN, (JUVEN) PACK Place 1 packet into feeding tube 2  (two) times daily between meals. 11/22/20 12/22/20  Bonnell Public, MD  Nutritional Supplements (FEEDING SUPPLEMENT, PROSOURCE TF,) liquid Place 45 mLs into feeding tube daily. 11/22/20 12/22/20  Dana Allan I, MD  pantoprazole (PROTONIX) 40 MG tablet Take 40 mg by mouth 2 (two) times daily. 10/19/20   [provider]  Polyethyl Glycol-Propyl Glycol (SYSTANE) 0.4-0.3 % SOLN Place 2 drops into both eyes 2 (two) times daily.    [provider]  polyvinyl alcohol (LIQUIFILM TEARS) 1.4 % ophthalmic solution Place 2 drops into both eyes as needed for dry eyes.    [provider]  senna-docusate (SENOKOT-S) 8.6-50 MG tablet Take 1 tablet by mouth daily.    [provider]  sertraline (ZOLOFT) 25 MG tablet Take 25 mg by mouth daily. Give with 50mg  dose 11/10/20   [provider]  sertraline (ZOLOFT) 50 MG tablet Take 50 mg by mouth daily. Give with 25mg  dose 11/10/20   [provider]  tamsulosin (FLOMAX) 0.4 MG CAPS capsule Take 1 capsule (0.4 mg total) by mouth daily after supper. 06/10/20   Rai, Vernelle Emerald, MD  traZODone (DESYREL) 50 MG tablet Take 50 mg by mouth at bedtime.    [provider]  vitamin B-12 (CYANOCOBALAMIN) 500 MCG tablet Take 500 mcg by mouth daily.    [provider]  Water For Irrigation, Sterile (FREE WATER) SOLN Place 150 mLs into feeding tube 4 (four) times daily. 11/22/20 12/22/20  Dana Allan I, MD  zinc sulfate 220 (50 Zn) MG capsule Place 1 capsule (220 mg total) into feeding tube daily. Patient taking differently: Take 220 mg by mouth daily. 06/10/20   Mendel Corning, MD    Physical Exam: Vitals:   12/21/20 2000 12/21/20 2030 12/21/20 2100 12/21/20 2130  BP: 128/63 (!) 140/93 (!) 112/44 125/64  Pulse: (!) 104 (!) 116 (!) 113 (!) 115  Resp: 12 13 18 14   Temp:      TempSrc:      SpO2: 99% 99% 98% 97%    ***  There is a sacral decubitus ulcer that is covered in wet to dry dressing as well as an  occlusive dressing.  It appears to be healing well, and there is no obvious infection He has a very shallow wound at the posterior aspect of the left foot over the calcaneus.  He also has some wounds over the right foot also over the calcaneus but also over the midpoint of the Achilles tendon.  There is a little bit of erythematous streaking up the distal aspect of the posterior calf on the right.***  Labs on Admission: I have personally reviewed following labs and imaging studies  CBC: Recent Labs  Lab 12/21/20 1650  WBC 12.8*  NEUTROABS 11.8*  HGB 9.2*  HCT 30.5*  MCV 78.8*  PLT 382   Basic Metabolic Panel: Recent Labs  Lab 12/21/20 1650  NA 135  K 4.3  CL 99  CO2 23  GLUCOSE 216*  BUN 60*  CREATININE 3.69*  CALCIUM 8.0*   GFR: CrCl cannot be calculated (Unknown ideal weight.). Liver Function Tests: Recent Labs  Lab 12/21/20 1650  AST 14*  ALT 12  ALKPHOS 89  BILITOT 0.7  PROT 6.0*  ALBUMIN 2.4*   No results for input(s): LIPASE, AMYLASE in the last 168 hours. No results for input(s): AMMONIA in the last 168 hours. Coagulation Profile: Recent Labs  Lab 12/21/20 1650  INR 1.2   Cardiac Enzymes: No results for input(s): CKTOTAL, CKMB, CKMBINDEX, TROPONINI in the last 168 hours. BNP (last 3 results) No results for input(s): PROBNP in the last 8760 hours. HbA1C: No results for input(s): HGBA1C in the last 72 hours. CBG: No results for input(s): GLUCAP in the last 168 hours. Lipid Profile: No results for input(s): CHOL, HDL, LDLCALC, TRIG, CHOLHDL, LDLDIRECT in the last 72 hours. Thyroid Function Tests: No results for input(s): TSH, T4TOTAL, FREET4, T3FREE, THYROIDAB in the last 72 hours. Anemia Panel: No results for input(s): VITAMINB12, FOLATE, FERRITIN, TIBC, IRON, RETICCTPCT in the last 72 hours. Urine analysis:    Component Value Date/Time   COLORURINE YELLOW 12/21/2020 1704   APPEARANCEUR TURBID (A) 12/21/2020 1704   LABSPEC 1.014 12/21/2020  1704   PHURINE 5.0 12/21/2020 1704   GLUCOSEU NEGATIVE 12/21/2020 1704   HGBUR LARGE (A) 12/21/2020 1704   BILIRUBINUR NEGATIVE 12/21/2020 1704   BILIRUBINUR n 03/18/2015 0928   KETONESUR NEGATIVE 12/21/2020 1704   PROTEINUR 100 (A) 12/21/2020 1704   UROBILINOGEN 2.0 03/18/2015 0928   UROBILINOGEN 1.0 07/16/2012 1345   NITRITE NEGATIVE 12/21/2020 1704   LEUKOCYTESUR MODERATE (A) 12/21/2020 1704    Radiological Exams on Admission: DG Chest 2 View  Result Date: 12/21/2020 CLINICAL DATA:  Suspected sepsis EXAM: CHEST - 2 VIEW COMPARISON:  11/16/2020 FINDINGS: Lingular opacity is unchanged, likely scarring. Right lung clear. Heart is normal size. No effusions or acute bony abnormality. IMPRESSION: Lingular scarring.  No active disease. Electronically Signed   By: Rolm Baptise M.D.   On: 12/21/2020 17:33   CT Renal Stone Study  Result Date: 12/21/2020 CLINICAL DATA:  Nausea. Decreased output through colostomy and decreased urine output. EXAM: CT ABDOMEN AND PELVIS WITHOUT CONTRAST TECHNIQUE: Multidetector CT imaging of the abdomen and pelvis was performed following the standard protocol without IV contrast. COMPARISON:  11/16/2020 FINDINGS: Lower chest: No acute abnormality. Dense coronary artery calcifications in the visualized right coronary artery. Hepatobiliary: No focal liver abnormality is seen. Status post cholecystectomy. No biliary dilatation. Pancreas: No focal abnormality or ductal dilatation. Spleen: No focal abnormality.  Normal size. Adrenals/Urinary Tract: Large cyst in the upper pole of the left kidney with layering high-density material, likely milk of calcium. No hydronephrosis. Adrenal glands unremarkable. Foley catheter is in place with decompressed urinary bladder. Bilateral perinephric stranding is stable since prior study. Stomach/Bowel: Left lower quadrant ostomy noted. No evidence of bowel obstruction. Stomach and small bowel decompressed. Gastrostomy tube within the stomach.  Scattered colonic diverticula. Vascular/Lymphatic: Diffuse aortoiliac atherosclerosis. No evidence of aneurysm or adenopathy. Reproductive: No visible focal abnormality. Other: No free fluid or free air. Musculoskeletal: No acute bony abnormality. Diffuse degenerative changes in the lumbar spine. IMPRESSION: No ureteral stones or hydronephrosis. Foley catheter is within the bladder which is decompressed. Left lower quadrant ostomy noted.  No evidence of bowel obstruction. Colonic diverticulosis. Aortoiliac atherosclerosis. Electronically Signed   By: Rolm Baptise M.D.   On: 12/21/2020 20:34    EKG: No EKG done in the ED.  EKG ordered now and currently pending.***  Assessment/Plan Active Problems:   UTI (urinary tract infection)        *** HIV screening*** The patient falls between the ages of 13-64 and should be screened for HIV, therefore HIV testing ordered.  DVT prophylaxis: ***  Code Status: *** Family Communication: ***  Disposition Plan: Status is: Inpatient  {Inpatient:23812}  Dispo: The patient is from: {From:23814}              Anticipated d/c is to: {To:23815}              Patient currently {Medically stable:23817}   Difficult to place patient {Yes/No:25151}       Consults called: *** Admission status: *** Level of care: Level of care: Telemetry The medical decision making on this patient was of high complexity and the patient is at high risk for clinical deterioration, therefore this is a level 3 visit.***  The medical decision making is of moderate complexity, therefore this is a level 2 visit.***  Shela Leff MD [facility]  If 7PM-7AM, please contact night-coverage [email or web address]  12/21/2020, 10:27 PM

## 2020-12-21 NOTE — ED Triage Notes (Signed)
Pt brought from [facility] sent for nausea & dry heaves since Friday, decreased fecal output (colostomy) & decreased urine output (foley). Pt there for UTI, found a kink in catheter this am- staff unsure how long it was there. EMS states urine appears cloudy. 300NS given en route & facility gave 8mg  zofran  99.8  HR 100 90/40

## 2020-12-22 DIAGNOSIS — L89159 Pressure ulcer of sacral region, unspecified stage: Secondary | ICD-10-CM

## 2020-12-22 DIAGNOSIS — L89154 Pressure ulcer of sacral region, stage 4: Secondary | ICD-10-CM

## 2020-12-22 DIAGNOSIS — N3001 Acute cystitis with hematuria: Secondary | ICD-10-CM

## 2020-12-22 DIAGNOSIS — A419 Sepsis, unspecified organism: Secondary | ICD-10-CM

## 2020-12-22 DIAGNOSIS — R110 Nausea: Secondary | ICD-10-CM

## 2020-12-22 LAB — BLOOD CULTURE ID PANEL (REFLEXED) - BCID2

## 2020-12-22 LAB — SARS CORONAVIRUS 2 (TAT 6-24 HRS): SARS Coronavirus 2: NEGATIVE

## 2020-12-22 LAB — CBC
HCT: 28.7 % — ABNORMAL LOW (ref 39.0–52.0)
Hemoglobin: 8.6 g/dL — ABNORMAL LOW (ref 13.0–17.0)
MCH: 23.5 pg — ABNORMAL LOW (ref 26.0–34.0)
MCHC: 30 g/dL (ref 30.0–36.0)
MCV: 78.4 fL — ABNORMAL LOW (ref 80.0–100.0)
Platelets: 255 10*3/uL (ref 150–400)
RBC: 3.66 MIL/uL — ABNORMAL LOW (ref 4.22–5.81)
RDW: 19.3 % — ABNORMAL HIGH (ref 11.5–15.5)
WBC: 10.6 10*3/uL — ABNORMAL HIGH (ref 4.0–10.5)
nRBC: 0 % (ref 0.0–0.2)

## 2020-12-22 LAB — BASIC METABOLIC PANEL
Anion gap: 12 (ref 5–15)
BUN: 53 mg/dL — ABNORMAL HIGH (ref 8–23)
CO2: 20 mmol/L — ABNORMAL LOW (ref 22–32)
Calcium: 7.6 mg/dL — ABNORMAL LOW (ref 8.9–10.3)
Chloride: 106 mmol/L (ref 98–111)
Creatinine, Ser: 2.47 mg/dL — ABNORMAL HIGH (ref 0.61–1.24)
GFR, Estimated: 26 mL/min — ABNORMAL LOW (ref >60)
Glucose, Bld: 183 mg/dL — ABNORMAL HIGH (ref 70–99)
Potassium: 3.8 mmol/L (ref 3.5–5.1)
Sodium: 138 mmol/L (ref 135–145)

## 2020-12-22 LAB — CBG MONITORING, ED: Glucose-Capillary: 176 mg/dL — ABNORMAL HIGH (ref 70–99)

## 2020-12-22 LAB — GLUCOSE, CAPILLARY
Glucose-Capillary: 122 mg/dL — ABNORMAL HIGH (ref 70–99)
Glucose-Capillary: 133 mg/dL — ABNORMAL HIGH (ref 70–99)
Glucose-Capillary: 136 mg/dL — ABNORMAL HIGH (ref 70–99)
Glucose-Capillary: 142 mg/dL — ABNORMAL HIGH (ref 70–99)
Glucose-Capillary: 146 mg/dL — ABNORMAL HIGH (ref 70–99)
Glucose-Capillary: 176 mg/dL — ABNORMAL HIGH (ref 70–99)

## 2020-12-22 LAB — LACTIC ACID, PLASMA: Lactic Acid, Venous: 1.1 mmol/L (ref 0.5–1.9)

## 2020-12-22 MED ORDER — SERTRALINE HCL 50 MG PO TABS
50.0000 mg | ORAL_TABLET | Freq: Every day | ORAL | Status: DC
  Administered 2020-12-22 – 2020-12-26 (×5): 50 mg via ORAL
  Filled 2020-12-22 (×5): qty 1

## 2020-12-22 MED ORDER — SERTRALINE HCL 50 MG PO TABS
25.0000 mg | ORAL_TABLET | Freq: Every day | ORAL | Status: DC
  Administered 2020-12-22 – 2020-12-26 (×5): 25 mg via ORAL
  Filled 2020-12-22 (×5): qty 1

## 2020-12-22 MED ORDER — ICOSAPENT ETHYL 1 G PO CAPS
2.0000 g | ORAL_CAPSULE | Freq: Two times a day (BID) | ORAL | Status: DC
  Administered 2020-12-22: 2 g via ORAL
  Filled 2020-12-22 (×9): qty 2

## 2020-12-22 MED ORDER — ATORVASTATIN CALCIUM 10 MG PO TABS
20.0000 mg | ORAL_TABLET | Freq: Every day | ORAL | Status: DC
  Administered 2020-12-22 – 2020-12-26 (×5): 20 mg via ORAL
  Filled 2020-12-22 (×5): qty 2

## 2020-12-22 MED ORDER — INSULIN ASPART 100 UNIT/ML ~~LOC~~ SOLN
0.0000 [IU] | SUBCUTANEOUS | Status: DC
  Administered 2020-12-22 (×3): 1 [IU] via SUBCUTANEOUS
  Administered 2020-12-22: 2 [IU] via SUBCUTANEOUS
  Administered 2020-12-22: 1 [IU] via SUBCUTANEOUS
  Administered 2020-12-23 (×2): 2 [IU] via SUBCUTANEOUS
  Administered 2020-12-23 – 2020-12-24 (×2): 1 [IU] via SUBCUTANEOUS
  Administered 2020-12-24 (×2): 2 [IU] via SUBCUTANEOUS
  Administered 2020-12-24: 3 [IU] via SUBCUTANEOUS
  Administered 2020-12-24: 2 [IU] via SUBCUTANEOUS
  Administered 2020-12-24: 1 [IU] via SUBCUTANEOUS
  Administered 2020-12-25: 2 [IU] via SUBCUTANEOUS
  Administered 2020-12-25: 1 [IU] via SUBCUTANEOUS
  Administered 2020-12-25: 2 [IU] via SUBCUTANEOUS
  Administered 2020-12-25: 1 [IU] via SUBCUTANEOUS
  Administered 2020-12-25: 3 [IU] via SUBCUTANEOUS
  Administered 2020-12-25: 2 [IU] via SUBCUTANEOUS
  Administered 2020-12-25 – 2020-12-26 (×2): 1 [IU] via SUBCUTANEOUS
  Administered 2020-12-26: 2 [IU] via SUBCUTANEOUS

## 2020-12-22 MED ORDER — TRAZODONE HCL 100 MG PO TABS
50.0000 mg | ORAL_TABLET | Freq: Every day | ORAL | Status: DC
  Administered 2020-12-22 – 2020-12-25 (×4): 50 mg via ORAL
  Filled 2020-12-22 (×4): qty 1

## 2020-12-22 MED ORDER — GABAPENTIN 300 MG PO CAPS
300.0000 mg | ORAL_CAPSULE | Freq: Every day | ORAL | Status: DC
  Administered 2020-12-22 – 2020-12-25 (×4): 300 mg via ORAL
  Filled 2020-12-22 (×4): qty 1

## 2020-12-22 MED ORDER — CHLORHEXIDINE GLUCONATE CLOTH 2 % EX PADS
6.0000 | MEDICATED_PAD | Freq: Every day | CUTANEOUS | Status: DC
  Administered 2020-12-22 – 2020-12-26 (×5): 6 via TOPICAL

## 2020-12-22 MED ORDER — COLLAGENASE 250 UNIT/GM EX OINT
TOPICAL_OINTMENT | Freq: Every day | CUTANEOUS | Status: DC
  Filled 2020-12-22: qty 30

## 2020-12-22 MED ORDER — SODIUM CHLORIDE 0.9 % IV SOLN
2.0000 g | INTRAVENOUS | Status: DC
  Administered 2020-12-22 – 2020-12-25 (×4): 2 g via INTRAVENOUS
  Filled 2020-12-22: qty 2
  Filled 2020-12-22 (×3): qty 20
  Filled 2020-12-22: qty 2

## 2020-12-22 MED ORDER — FLUTICASONE PROPIONATE 50 MCG/ACT NA SUSP
2.0000 | Freq: Two times a day (BID) | NASAL | Status: DC
  Administered 2020-12-22 – 2020-12-26 (×9): 2 via NASAL
  Filled 2020-12-22: qty 16

## 2020-12-22 MED ORDER — FERROUS GLUCONATE 324 (38 FE) MG PO TABS
324.0000 mg | ORAL_TABLET | Freq: Every day | ORAL | Status: DC
  Administered 2020-12-22 – 2020-12-26 (×5): 324 mg via ORAL
  Filled 2020-12-22 (×5): qty 1

## 2020-12-22 MED ORDER — ASPIRIN EC 81 MG PO TBEC
81.0000 mg | DELAYED_RELEASE_TABLET | Freq: Every day | ORAL | Status: DC
  Administered 2020-12-22 – 2020-12-25 (×4): 81 mg via ORAL
  Filled 2020-12-22 (×4): qty 1

## 2020-12-22 MED ORDER — TAMSULOSIN HCL 0.4 MG PO CAPS
0.4000 mg | ORAL_CAPSULE | Freq: Every day | ORAL | Status: DC
  Administered 2020-12-22 – 2020-12-25 (×4): 0.4 mg via ORAL
  Filled 2020-12-22 (×4): qty 1

## 2020-12-22 MED ORDER — SENNOSIDES-DOCUSATE SODIUM 8.6-50 MG PO TABS
2.0000 | ORAL_TABLET | Freq: Every day | ORAL | Status: DC
  Administered 2020-12-22 – 2020-12-26 (×4): 2 via ORAL
  Filled 2020-12-22 (×5): qty 2

## 2020-12-22 MED ORDER — LACTATED RINGERS IV SOLN: INTRAVENOUS | Status: DC

## 2020-12-22 MED ORDER — PANTOPRAZOLE SODIUM 40 MG PO TBEC
40.0000 mg | DELAYED_RELEASE_TABLET | Freq: Every day | ORAL | Status: DC
  Administered 2020-12-22 – 2020-12-26 (×5): 40 mg via ORAL
  Filled 2020-12-22 (×5): qty 1

## 2020-12-22 NOTE — ED Notes (Signed)
Hospitalist at bedside. Wounds were checked. Patient is paraplegic, currently living at [facility] temporarily. Normally lives at home with wife.

## 2020-12-22 NOTE — Progress Notes (Signed)
PROGRESS NOTE  Brief Narrative: Brent Broyhill. is a 75 y.o. male with medical history significant of paraplegia secondary to transverse myelitis, dysphagia status post G-tube placement, stage IV sacral pressure ulcer with left gluteal abscess and L4-L5 osteomyelitis and right psoas abscess in 2021 and followed by infectious disease, status post diverting loop colostomy, CAD, insulin-dependent type 2 diabetes, hypertension, hyperlipidemia, history of malignant melanoma, obesity, OSA, hospital admission a month ago for UTI (culture grew Proteus mirabilis) and hemorrhagic cystitis in the setting of indwelling Foley catheter followed by urology. He was sent from SNF due to dry heaves, decreased ostomy and urine output, found to be tachycardic, hypotensive with leukocytosis, lactic acidosis, with purulent urine collection and ARF without obstruction on CT abd/pelvis.   He was admitted this morning by Dr. Marlowe Sax. IVF and antibiotics have been given with hemodynamic stability and improvement in renal failure.   Subjective: Has no complaints. Has not had vomiting, wants to eat.   Objective: BP (!) 111/44   Pulse 95   Temp 97.9 F (36.6 C) (Oral)   Resp 18   Ht 6' (1.829 m)   SpO2 96%   BMI 27.40 kg/m   Gen: Elderly frail male in no distress resting quietly. Pulm: Clear and nonlabored  CV: RRR, no murmur, no JVD, no edema GI: Soft, NT, ND, +BS. +ostomy and foley output.   Neuro: Drowsy but rousable.  Skin: As detailed in [place] note, many many wounds.   Assessment & Plan: 75yo M with many severe comorbidities admitted from SNF for severe sepsis due to UTI with ARF, lactic acidosis. Creatinine and WBC trending downward and he's afebrile. Tachycardia improved, though BPs remain soft.  - Recheck lactic acid to confirm clearance - Continue cefepime based on prior UCx, follow up blood Cx's - Continue IVF, no evidence of volume overload at this time - I attempted to update the patient's wife  about his clinical condition and treatment plan but was unable to reach her by phone at her cell and home numbers. Also called pt's room and cell phone without answer. He was previously DNR, but currently listed as full code.   Patrecia Pour, MD Pager on amion 12/22/2020, 1:59 PM

## 2020-12-22 NOTE — ED Notes (Signed)
BS 176

## 2020-12-22 NOTE — ED Notes (Signed)
Patient has gastro tube and ostomy. Patient does not recall when or where Gtube was placed. Patient states he eats food by mouth. Patient also has wounds on both lower limbs.

## 2020-12-22 NOTE — TOC Progression Note (Addendum)
Transition of Care (TOC) - Progression Note    Patient Details  Name: Brent [name]. MRN: [id number] Date of Birth: 03/30/1946  Transition of Care [facility]) CM/SW Contact  Purcell Mouton, RN Phone Number: 12/22/2020, 2:05 PM  Clinical Narrative:     Pt is from [facility]. Spoke with Admission Coordinator.  Plan is to return when stable. Will need a COVID test 24 hours before returning to [facility].   Expected Discharge Plan: [place] ([place] Term) Barriers to Discharge: No Barriers Identified  Expected Discharge Plan and Services Expected Discharge Plan: [place] ([place])       Living arrangements for the past 2 months: [place]                                       Social Determinants of Health (SDOH) Interventions    Readmission Risk Interventions Readmission Risk Prevention Plan 06/08/2020  Transportation Screening Complete  Medication Review (RN Care Manager) Referral to Pharmacy  PCP or Specialist appointment within 3-5 days of discharge Not Complete  PCP/Specialist Appt Not Complete comments SNF resident  [facility] or [place] Complete  SW Recovery Care/Counseling Consult Complete  Palliative Care Screening Complete  Skilled Nursing Facility Complete  Some recent data might be hidden

## 2020-12-22 NOTE — H&P (Signed)
History and Physical    Brent [name]. MPN:[id number] DOB: [birth year] DOA: 12/21/2020  PCP: Brent [name], Brent Halsted, MD Patient coming from: [place] rehab  Chief Complaint: Nausea, dry heaves, decreased stool and urine output  HPI: Brent [name]. is a 75 y.o. male with medical history significant of paraplegia secondary to transverse myelitis, dysphagia status post G-tube placement, stage IV sacral pressure ulcer with left gluteal abscess and L4-L5 osteomyelitis and right psoas abscess in 2021 and followed by infectious disease, status post diverting loop colostomy, CAD, insulin-dependent type 2 diabetes, hypertension, hyperlipidemia, history of malignant melanoma, obesity, OSA, hospital admission a month ago for UTI (culture grew Proteus mirabilis) and hemorrhagic cystitis in the setting of indwelling Foley catheter followed by urology.  Patient was sent to the ED today from his rehab facility for evaluation of nausea and dry heaves for the past 3 days along with decreased ostomy output and urine output.  In the ED, afebrile.  Hypotensive with systolic in the 00Q.  Slightly tachycardic.  Labs showing WBC 12.8, hemoglobin 9.2 (stable), platelet count 285K.  Sodium 135, potassium 4.3, chloride 99, bicarb 23, BUN 60, creatinine 3.6 (baseline 0.3), glucose 216.  No elevation of LFTs.  Lactic acid 2.2 >3.1.  INR 1.2.  Blood culture x2 pending.  His urine appeared grossly infected with pus and urinalysis showing moderate amount of leukocytes and greater than 50 WBCs.  Covid PCR negative.  Chest x-ray not suggestive of pneumonia.  CT renal stone study without findings to suggest obstructive uropathy; no evidence of bowel obstruction. Patient was given Tylenol, Zofran, cefepime, and 30 cc per cake fluid boluses per sepsis protocol.  Patient states he has been nauseous and has not been able to eat.  He has not vomited.  Denies abdominal pain.  Denies back pain.  No additional history could  be obtained from him.  Review of Systems:  All systems reviewed and apart from history of presenting illness, are negative.  Past Medical History:  Diagnosis Date  . Allergy   . CAD (coronary artery disease)    a. angioplasty of his RCA in 1990. b. bare metal stent placed in the RCA in 2000 followed by rotational atherectomy shortly after for stent restenosis. c. last cath was in 2012 showing stable moderate diffuse CAD. (70% mid LAD, 80% diagonal, 70% Ramus, 40% mid to distal RCA stent restenosis). d. Low risk nuc in 2015.  . Diabetes mellitus   . Diverticulosis   . Elevated CK   . Erectile dysfunction   . Hemorrhoids   . HTN (hypertension)   . Hyperlipidemia   . Hypertriglyceridemia   . Malignant melanoma of left side of neck ([place]) 10/25/2018  . Myocardial infarction ([place])   . Obesity   . OSA (obstructive sleep apnea)   . Persistent disorder of initiating or maintaining sleep   . Personal history of colonic polyps 02/05/2003    Past Surgical History:  Procedure Laterality Date  . CHOLECYSTECTOMY    . CORONARY ANGIOPLASTY    . CORONARY STENT PLACEMENT     stenting of the right coronary artery with followup rotational  atherectomy. (3 stents placed)  . FINGER SURGERY     right  . FOOT SURGERY     right  . INGUINAL HERNIA REPAIR     right  . IRRIGATION AND DEBRIDEMENT ABSCESS N/A 06/04/2020   Procedure: IRRIGATION AND DEBRIDEMENT SACRAL WOUND;  Surgeon: Jesusita Oka, MD;  Location: [facility];  Service:  General;  Laterality: N/A;  . LAPAROSCOPY N/A 06/04/2020   Procedure: LAPAROSCOPY ASSISTED COLOSTOMY, LAPAROSCOPY GASTROSTOMY;  Surgeon: Jesusita Oka, MD;  Location: [facility];  Service: General;  Laterality: N/A;  . LYSIS OF ADHESION N/A 06/04/2020   Procedure: LYSIS OF ADHESION;  Surgeon: Jesusita Oka, MD;  Location: [facility];  Service: General;  Laterality: N/A;  . melanoma removal     neck  . ORTHOPEDIC SURGERY     foot right  . rotator cuff surg     Bil     reports  that he quit smoking about 40 years ago. His smoking use included cigarettes. He has a 45.00 pack-year smoking history. He has never used smokeless tobacco. He reports previous alcohol use of about 7.0 - 14.0 standard drinks of alcohol per week. He reports that he does not use drugs.  Allergies  Allergen Reactions  . Levofloxacin Rash  . Penicillins Hives and Rash    Tolerated Unasyn, cefepime and cephalexin    Family History  Problem Relation Age of Onset  . Liver cancer Father   . Lung cancer Father   . Heart disease Father   . Heart disease Sister   . Lung cancer Mother   . COPD Mother   . Obesity Mother   . Colon cancer Neg Hx   . Stomach cancer Neg Hx   . Pancreatic cancer Neg Hx     Prior to Admission medications   Medication Sig Start Date End Date Taking? Authorizing Provider  acetaminophen (TYLENOL) 325 MG tablet Take 2 tablets (650 mg total) by mouth every 6 (six) hours as needed for headache. 04/24/20   Love, Ivan Anchors, PA-C  albuterol (PROVENTIL) (2.5 MG/3ML) 0.083% nebulizer solution Take 3 mLs (2.5 mg total) by nebulization once as needed for wheezing or shortness of breath (or anaphylaxis due to Rituxan infusion). 04/24/20   Love, Ivan Anchors, PA-C  antiseptic oral rinse (BIOTENE) LIQD 10 mLs by Mouth Rinse route 2 (two) times daily. Rinse and spit    [provider]  Ascorbic Acid (VITA-C PO) Take 500 mg by mouth daily.    [provider]  aspirin EC 81 MG tablet Take 81 mg by mouth at bedtime. Swallow whole.    [provider]  atorvastatin (LIPITOR) 20 MG tablet Take 1 tablet (20 mg total) by mouth daily. 03/26/20   Shelly Coss, MD  cholestyramine (QUESTRAN) 4 g packet Take 4 g by mouth daily.    [provider]  collagenase (SANTYL) ointment Apply topically daily. 11/23/20   Dana Allan I, MD  ferrous sulfate 300 (60 Fe) MG/5ML syrup Take 300 mg by mouth daily.    [provider]  fluticasone (FLONASE) 50 MCG/ACT nasal  spray Place 2 sprays into both nostrils 2 (two) times daily. 04/24/20   Love, Ivan Anchors, PA-C  gabapentin (NEURONTIN) 300 MG capsule Place 1 capsule (300 mg total) into feeding tube at bedtime. Patient taking differently: Take 300 mg by mouth 3 (three) times daily. 06/10/20   Rai, Ripudeep K, MD  hydrogen peroxide ([employer] PEROXIDE SORE MOUTH CLEANS) 1.5 % SOLN Apply topically 2 times daily at 12 noon and 4 pm. 10 mL, mucous membrane, twice a day, rinse and spit    [provider]  Icosapent Ethyl (VASCEPA) 1 g CAPS Take 2 capsules (2 g total) by mouth 2 (two) times daily. 07/19/19   Dunn, Nedra Hai, PA-C  insulin aspart (NOVOLOG) 100 UNIT/ML injection Inject 0-15 Units into the  skin every 4 (four) hours. Sliding scale  CBG 70 - 120: 0 units: CBG 121 - 150: 2 units; CBG 151 - 200: 3 units; CBG 201 - 250: 5 units; CBG 251 - 300: 8 units;CBG 301 - 350: 11 units; CBG 351 - 400: 15 units; CBG > 400 : 15 units and notify MD Patient taking differently: Inject 0-15 Units into the skin every 4 (four) hours. Sliding scale  CBG 70 - 200: 0 units: CBG 201 - 250: 2 units; CBG 251 - 300: 4 units; CBG 301 - 350: 6 units; CBG 351 - 400: 8 units;CBG 401 - 450: 10 units; CBG 451 - 700: 15units and notify MD 06/10/20   Rai, Ripudeep K, MD  insulin glargine (LANTUS) 100 unit/mL SOPN Inject 15 Units into the skin at bedtime. Patient taking differently: Inject 42 Units into the skin at bedtime. 06/10/20   Rai, Vernelle Emerald, MD  lactulose (CHRONULAC) 10 GM/15ML solution Take 15 g by mouth as needed for mild constipation.    [provider]  Lidocaine 4 % PTCH Apply 1 patch topically daily. Apply to right hip in the morning remove at night    [provider]  lisinopril (ZESTRIL) 10 MG tablet Take 10 mg by mouth daily. 11/03/20   [provider]  Menthol, Topical Analgesic, (BIOFREEZE) 4 % GEL Apply 1 application topically in the morning, at noon, and at bedtime.     [provider]  metFORMIN  (GLUCOPHAGE) 1000 MG tablet Take 1,000 mg by mouth daily.    [provider]  nutrition supplement, JUVEN, (JUVEN) PACK Place 1 packet into feeding tube 2 (two) times daily between meals. 11/22/20 12/22/20  Bonnell Public, MD  Nutritional Supplements (FEEDING SUPPLEMENT, PROSOURCE TF,) liquid Place 45 mLs into feeding tube daily. 11/22/20 12/22/20  Dana Allan I, MD  pantoprazole (PROTONIX) 40 MG tablet Take 40 mg by mouth 2 (two) times daily. 10/19/20   [provider]  Polyethyl Glycol-Propyl Glycol (SYSTANE) 0.4-0.3 % SOLN Place 2 drops into both eyes 2 (two) times daily.    [provider]  polyvinyl alcohol (LIQUIFILM TEARS) 1.4 % ophthalmic solution Place 2 drops into both eyes as needed for dry eyes.    [provider]  senna-docusate (SENOKOT-S) 8.6-50 MG tablet Take 1 tablet by mouth daily.    [provider]  sertraline (ZOLOFT) 25 MG tablet Take 25 mg by mouth daily. Give with 50mg  dose 11/10/20   [provider]  sertraline (ZOLOFT) 50 MG tablet Take 50 mg by mouth daily. Give with 25mg  dose 11/10/20   [provider]  tamsulosin (FLOMAX) 0.4 MG CAPS capsule Take 1 capsule (0.4 mg total) by mouth daily after supper. 06/10/20   Rai, Vernelle Emerald, MD  traZODone (DESYREL) 50 MG tablet Take 50 mg by mouth at bedtime.    [provider]  vitamin B-12 (CYANOCOBALAMIN) 500 MCG tablet Take 500 mcg by mouth daily.    [provider]  Water For Irrigation, Sterile (FREE WATER) SOLN Place 150 mLs into feeding tube 4 (four) times daily. 11/22/20 12/22/20  Dana Allan I, MD  zinc sulfate 220 (50 Zn) MG capsule Place 1 capsule (220 mg total) into feeding tube daily. Patient taking differently: Take 220 mg by mouth daily. 06/10/20   Mendel Corning, MD    Physical Exam: Vitals:   12/22/20 0100 12/22/20 0130 12/22/20 0200 12/22/20 0229  BP: (!) 123/57 (!) 131/54 (!) 130/53   Pulse: (!) 108 Marland Kitchen)  109 (!) 108   Resp: 14 16     Temp:    99.2 F (37.3 C)  TempSrc:    Oral  SpO2: 95% 94% 94%     Physical Exam Constitutional:      General: He is not in acute distress. HENT:     Head: Normocephalic and atraumatic.     Mouth/Throat:     Mouth: Mucous membranes are dry.  Eyes:     Extraocular Movements: Extraocular movements intact.  Cardiovascular:     Rate and Rhythm: Regular rhythm. Tachycardia present.     Pulses: Normal pulses.  Pulmonary:     Effort: Pulmonary effort is normal. No respiratory distress.     Breath sounds: Normal breath sounds. No wheezing or rales.  Abdominal:     General: Bowel sounds are normal. There is no distension.     Palpations: Abdomen is soft.     Tenderness: There is no abdominal tenderness.     Comments: No obvious signs of infection at G-tube insertion site Ostomy bag in place with liquid stool  Musculoskeletal:     Cervical back: Normal range of motion and neck supple.     Right lower leg: Edema present.     Left lower leg: Edema present.     Comments: Bilateral pedal edema  Skin:    General: Skin is warm and dry.     Comments: Sacral decubitus ulcer without obvious signs of infection Superficial wounds with no drainage and minimal surrounding erythema noted at the posterior aspect of bilateral lower legs  Neurological:     Mental Status: He is alert and oriented to person, place, and time.      Labs on Admission: I have personally reviewed following labs and imaging studies  CBC: Recent Labs  Lab 12/21/20 1650  WBC 12.8*  NEUTROABS 11.8*  HGB 9.2*  HCT 30.5*  MCV 78.8*  PLT 008   Basic Metabolic Panel: Recent Labs  Lab 12/21/20 1650  NA 135  K 4.3  CL 99  CO2 23  GLUCOSE 216*  BUN 60*  CREATININE 3.69*  CALCIUM 8.0*   GFR: CrCl cannot be calculated (Unknown ideal weight.). Liver Function Tests: Recent Labs  Lab 12/21/20 1650  AST 14*  ALT 12  ALKPHOS 89  BILITOT 0.7  PROT 6.0*  ALBUMIN 2.4*   No results for input(s): LIPASE,  AMYLASE in the last 168 hours. No results for input(s): AMMONIA in the last 168 hours. Coagulation Profile: Recent Labs  Lab 12/21/20 1650  INR 1.2   Cardiac Enzymes: No results for input(s): CKTOTAL, CKMB, CKMBINDEX, TROPONINI in the last 168 hours. BNP (last 3 results) No results for input(s): PROBNP in the last 8760 hours. HbA1C: No results for input(s): HGBA1C in the last 72 hours. CBG: Recent Labs  Lab 12/22/20 0036  GLUCAP 176*   Lipid Profile: No results for input(s): CHOL, HDL, LDLCALC, TRIG, CHOLHDL, LDLDIRECT in the last 72 hours. Thyroid Function Tests: No results for input(s): TSH, T4TOTAL, FREET4, T3FREE, THYROIDAB in the last 72 hours. Anemia Panel: No results for input(s): VITAMINB12, FOLATE, FERRITIN, TIBC, IRON, RETICCTPCT in the last 72 hours. Urine analysis:    Component Value Date/Time   COLORURINE YELLOW 12/21/2020 1704   APPEARANCEUR TURBID (A) 12/21/2020 1704   LABSPEC 1.014 12/21/2020 1704   PHURINE 5.0 12/21/2020 1704   GLUCOSEU NEGATIVE 12/21/2020 1704   HGBUR LARGE (A) 12/21/2020 [place] 12/21/2020 1704   BILIRUBINUR n 03/18/2015 6761  KETONESUR NEGATIVE 12/21/2020 1704   PROTEINUR 100 (A) 12/21/2020 1704   UROBILINOGEN 2.0 03/18/2015 0928   UROBILINOGEN 1.0 07/16/2012 1345   NITRITE NEGATIVE 12/21/2020 1704   LEUKOCYTESUR MODERATE (A) 12/21/2020 1704    Radiological Exams on Admission: DG Chest 2 View  Result Date: 12/21/2020 CLINICAL DATA:  Suspected sepsis EXAM: CHEST - 2 VIEW COMPARISON:  11/16/2020 FINDINGS: Lingular opacity is unchanged, likely scarring. Right lung clear. Heart is normal size. No effusions or acute bony abnormality. IMPRESSION: Lingular scarring.  No active disease. Electronically Signed   By: Rolm Baptise M.D.   On: 12/21/2020 17:33   CT Renal Stone Study  Result Date: 12/21/2020 CLINICAL DATA:  Nausea. Decreased output through colostomy and decreased urine output. EXAM: CT ABDOMEN AND PELVIS  WITHOUT CONTRAST TECHNIQUE: Multidetector CT imaging of the abdomen and pelvis was performed following the standard protocol without IV contrast. COMPARISON:  11/16/2020 FINDINGS: Lower chest: No acute abnormality. Dense coronary artery calcifications in the visualized right coronary artery. Hepatobiliary: No focal liver abnormality is seen. Status post cholecystectomy. No biliary dilatation. Pancreas: No focal abnormality or ductal dilatation. Spleen: No focal abnormality.  Normal size. Adrenals/Urinary Tract: Large cyst in the upper pole of the left kidney with layering high-density material, likely milk of calcium. No hydronephrosis. Adrenal glands unremarkable. Foley catheter is in place with decompressed urinary bladder. Bilateral perinephric stranding is stable since prior study. Stomach/Bowel: Left lower quadrant ostomy noted. No evidence of bowel obstruction. Stomach and small bowel decompressed. Gastrostomy tube within the stomach. Scattered colonic diverticula. Vascular/Lymphatic: Diffuse aortoiliac atherosclerosis. No evidence of aneurysm or adenopathy. Reproductive: No visible focal abnormality. Other: No free fluid or free air. Musculoskeletal: No acute bony abnormality. Diffuse degenerative changes in the lumbar spine. IMPRESSION: No ureteral stones or hydronephrosis. Foley catheter is within the bladder which is decompressed. Left lower quadrant ostomy noted.  No evidence of bowel obstruction. Colonic diverticulosis. Aortoiliac atherosclerosis. Electronically Signed   By: Rolm Baptise M.D.   On: 12/21/2020 20:34    EKG: No EKG done in the ED.  EKG ordered now and currently pending.  Assessment/Plan Principal Problem:   UTI (urinary tract infection) Active Problems:   AKI (acute kidney injury) ([place])   Severe sepsis ([facility])   Sacral decubitus ulcer   Nausea   Severe sepsis secondary to CAUTI Patient has an indwelling Foley catheter.  Tachycardic.  Hypotensive on arrival to the ED.  Labs  showing mild leukocytosis and lactic acidosis. His urine appeared grossly infected with pus and UA with evidence of pyuria.  Foley catheter was replaced in the ED.  Urine culture done during hospitalization a month ago grew Proteus mirabilis (sensitive to cefepime). -Continue cefepime.  Patient was given 30 cc/kg fluid boluses in the ED.  Blood pressure now improved but continues to be slightly tachycardic, continue maintenance IV fluid.  Order urine culture.  Blood culture x2 pending.  Trend white blood cell count and lactate.  AKI Likely prerenal azotemia from poor oral intake/dehydration and hypotension in the setting of severe sepsis.  BUN 60, creatinine 3.6.  Baseline creatinine appears to be around 0.3.  CT renal stone study without findings to suggest obstructive uropathy. -IV fluid hydration.  Continue to monitor renal function and urine output.  Avoid nephrotoxic agents/hold home lisinopril.  Stage IV sacral decubitus ulcer  No obvious signs of infection on exam.  Patient is not endorsing any pain. -Wound care consult  Nausea Likely due to UTI.  He is not vomiting.  Abdominal  exam benign.  No elevation of LFTs.  CT without evidence of bowel obstruction. -Antiemetic as needed  Insulin-dependent type 2 diabetes A1c 8.2 on 11/16/2020. -Since patient is nauseous and not eating, hold home basal insulin at this time.  Order sliding scale insulin sensitive every 4 hours.  Hyperlipidemia -Continue Lipitor and Vascepa  GERD -Continue PPI  Depression -Continue sertraline, trazodone  BPH -Continue Flomax  DVT prophylaxis: Subcutaneous heparin Code Status: Full code-discussed with the patient. Family Communication: No family available at this time. Disposition Plan: Status is: Inpatient  Remains inpatient appropriate because:IV treatments appropriate due to intensity of illness or inability to take PO and Inpatient level of care appropriate due to severity of illness   Dispo: The  patient is from: SNF              Anticipated d/c is to: SNF              Patient currently is not medically stable to d/c.   Difficult to place patient No  Level of care: Level of care: Telemetry   The medical decision making on this patient was of high complexity and the patient is at high risk for clinical deterioration, therefore this is a level 3 visit.  Shela Leff MD [facility]  If 7PM-7AM, please contact night-coverage [email or web address]  12/22/2020, 3:07 AM

## 2020-12-22 NOTE — ED Notes (Signed)
ED TO INPATIENT HANDOFF REPORT  Name/Age/Gender Brent [name]. 75 y.o. male  Code Status    Code Status Orders  (From admission, onward)         Start     Ordered   12/21/20 2251  Full code  Continuous        12/21/20 2252        Code Status History    Date Active Date Inactive Code Status Order ID Comments User Context   11/16/2020 1333 11/22/2020 2059 DNR [id number]  Harold Hedge, MD ED   11/16/2020 1137 11/16/2020 1333 DNR [id number]  Milton Ferguson, MD ED   06/01/2020 1517 06/11/2020 0407 Full Code [id number]  Lequita Halt, MD ED   03/25/2020 1501 04/24/2020 2250 Full Code [id number]  Cathlyn Parsons, PA-C Inpatient   03/25/2020 1501 03/25/2020 1501 Full Code [id number]  Cathlyn Parsons, PA-C Inpatient   03/08/2020 1744 03/25/2020 1457 Full Code [id number]  Lequita Halt, MD ED   Advance Care Planning Activity      Home/SNF/Other [facility]  Chief Complaint UTI (urinary tract infection) [N39.0]  Level of Care/Admitting Diagnosis ED Disposition    ED Disposition Condition [place] [facility] [facility] Area: [facility] [[id number]]  Level of Care: Telemetry [5]  Admit to tele based on following criteria: Complex arrhythmia (Bradycardia/Tachycardia)  May admit patient to [facility] or [facility] if equivalent level of care is available:: Yes  Covid Evaluation: Asymptomatic Screening Protocol (No Symptoms)  Diagnosis: UTI (urinary tract infection) [423536]  Admitting Physician: Shela Leff [[id number]]  Attending Physician: Shela Leff [[id number]]  Estimated length of stay: past midnight tomorrow  Certification:: I certify this patient will need inpatient services for at least 2 midnights       Medical History Past Medical History:  Diagnosis Date  . Allergy   . CAD (coronary artery disease)    a. angioplasty of his RCA in 1990. b. bare metal stent placed in the RCA in 2000 followed by rotational atherectomy shortly after for stent  restenosis. c. last cath was in 2012 showing stable moderate diffuse CAD. (70% mid LAD, 80% diagonal, 70% Ramus, 40% mid to distal RCA stent restenosis). d. Low risk nuc in 2015.  . Diabetes mellitus   . Diverticulosis   . Elevated CK   . Erectile dysfunction   . Hemorrhoids   . HTN (hypertension)   . Hyperlipidemia   . Hypertriglyceridemia   . Malignant melanoma of left side of neck ([place]) 10/25/2018  . Myocardial infarction ([place])   . Obesity   . OSA (obstructive sleep apnea)   . Persistent disorder of initiating or maintaining sleep   . Personal history of colonic polyps 02/05/2003    Allergies Allergies  Allergen Reactions  . Levofloxacin Rash  . Penicillins Hives and Rash    Tolerated Unasyn, cefepime and cephalexin    IV Location/Drains/Wounds Patient Lines/Drains/Airways Status    Active Line/Drains/Airways    Name Placement date Placement time Site Days   Peripheral IV 12/21/20 Right Hand 12/21/20  1609  Hand  1   Peripheral IV 12/21/20 Left Antecubital 12/21/20  1658  Antecubital  1   Urethral Catheter Danielle, RN Latex 16 Fr. 12/21/20  1731  Latex  1          Labs/Imaging Results for orders placed or performed during the hospital encounter of 12/21/20 (from the past 48 hour(s))  Lactic acid, plasma     Status: Abnormal  Collection Time: 12/21/20  4:40 PM  Result Value Ref Range   Lactic Acid, Venous 2.2 (HH) 0.5 - 1.9 mmol/L    Comment: CRITICAL RESULT CALLED TO, READ BACK BY AND VERIFIED WITH: D.HENRY, RN AT 1814 ON 04.04.22 BY N.THOMPSON Performed at [facility] [place] [address] [place], [place] [address]   Comprehensive metabolic panel     Status: Abnormal   Collection Time: 12/21/20  4:50 PM  Result Value Ref Range   Sodium 135 135 - 145 mmol/L   Potassium 4.3 3.5 - 5.1 mmol/L   Chloride 99 98 - 111 mmol/L   CO2 23 22 - 32 mmol/L   Glucose, Bld 216 (H) 70 - 99 mg/dL    Comment: Glucose reference range applies only to samples taken  after fasting for at least 8 hours.   BUN 60 (H) 8 - 23 mg/dL   Creatinine, Ser 3.69 (H) 0.61 - 1.24 mg/dL   Calcium 8.0 (L) 8.9 - 10.3 mg/dL   Total Protein 6.0 (L) 6.5 - 8.1 g/dL   Albumin 2.4 (L) 3.5 - 5.0 g/dL   AST 14 (L) 15 - 41 U/L   ALT 12 0 - 44 U/L   Alkaline Phosphatase 89 38 - 126 U/L   Total Bilirubin 0.7 0.3 - 1.2 mg/dL   GFR, Estimated 16 (L) >60 mL/min    Comment: (NOTE) Calculated using the CKD-EPI Creatinine Equation (2021)    Anion gap 13 5 - 15    Comment: Performed at [facility], [place] [address], [place], [place] [address]  CBC with Differential     Status: Abnormal   Collection Time: 12/21/20  4:50 PM  Result Value Ref Range   WBC 12.8 (H) 4.0 - 10.5 K/uL   RBC 3.87 (L) 4.22 - 5.81 MIL/uL   Hemoglobin 9.2 (L) 13.0 - 17.0 g/dL   HCT 30.5 (L) 39.0 - 52.0 %   MCV 78.8 (L) 80.0 - 100.0 fL   MCH 23.8 (L) 26.0 - 34.0 pg   MCHC 30.2 30.0 - 36.0 g/dL   RDW 19.2 (H) 11.5 - 15.5 %   Platelets 285 150 - 400 K/uL   nRBC 0.0 0.0 - 0.2 %   Neutrophils Relative % 92 %   Neutro Abs 11.8 (H) 1.7 - 7.7 K/uL   Lymphocytes Relative 3 %   Lymphs Abs 0.4 (L) 0.7 - 4.0 K/uL   Monocytes Relative 4 %   Monocytes Absolute 0.5 0.1 - 1.0 K/uL   Eosinophils Relative 0 %   Eosinophils Absolute 0.0 0.0 - 0.5 K/uL   Basophils Relative 0 %   Basophils Absolute 0.0 0.0 - 0.1 K/uL   WBC Morphology INCREASED BANDS (>20% BANDS)    Immature Granulocytes 1 %   Abs Immature Granulocytes 0.11 (H) 0.00 - 0.07 K/uL    Comment: Performed at [facility], [place] [address] [place], [place] [address]  Protime-INR     Status: None   Collection Time: 12/21/20  4:50 PM  Result Value Ref Range   Prothrombin Time 15.2 11.4 - 15.2 seconds   INR 1.2 0.8 - 1.2    Comment: (NOTE) INR goal varies based on device and disease states. Performed at [facility], [place] [address], [place], [place] [address]   Urinalysis, Routine w reflex microscopic  Urine, Catheterized     Status: Abnormal   Collection Time: 12/21/20  5:04 PM  Result Value Ref Range   Color, Urine YELLOW  YELLOW   APPearance TURBID (A) CLEAR   Specific Gravity, Urine 1.014 1.005 - 1.030   pH 5.0 5.0 - 8.0   Glucose, UA NEGATIVE NEGATIVE mg/dL   Hgb urine dipstick LARGE (A) NEGATIVE   Bilirubin Urine NEGATIVE NEGATIVE   Ketones, ur NEGATIVE NEGATIVE mg/dL   Protein, ur 100 (A) NEGATIVE mg/dL   Nitrite NEGATIVE NEGATIVE   Leukocytes,Ua MODERATE (A) NEGATIVE   RBC / HPF >50 (H) 0 - 5 RBC/hpf   WBC, UA >50 (H) 0 - 5 WBC/hpf   Bacteria, UA NONE SEEN NONE SEEN   WBC Clumps PRESENT    Ca Oxalate Crys, UA PRESENT     Comment: Performed at [facility], [place] [address], [place], [place] [address]  Lactic acid, plasma     Status: Abnormal   Collection Time: 12/21/20  7:28 PM  Result Value Ref Range   Lactic Acid, Venous 3.1 (HH) 0.5 - 1.9 mmol/L    Comment: CRITICAL VALUE NOTED.  VALUE IS CONSISTENT WITH PREVIOUSLY REPORTED AND CALLED VALUE. Performed at [facility], [place] [address], [place], [place] [address]   CBG monitoring, ED     Status: Abnormal   Collection Time: 12/22/20 12:36 AM  Result Value Ref Range   Glucose-Capillary 176 (H) 70 - 99 mg/dL    Comment: Glucose reference range applies only to samples taken after fasting for at least 8 hours.   DG Chest 2 View  Result Date: 12/21/2020 CLINICAL DATA:  Suspected sepsis EXAM: CHEST - 2 VIEW COMPARISON:  11/16/2020 FINDINGS: Lingular opacity is unchanged, likely scarring. Right lung clear. Heart is normal size. No effusions or acute bony abnormality. IMPRESSION: Lingular scarring.  No active disease. Electronically Signed   By: Rolm Baptise M.D.   On: 12/21/2020 17:33   CT Renal Stone Study  Result Date: 12/21/2020 CLINICAL DATA:  Nausea. Decreased output through colostomy and decreased urine output. EXAM: CT ABDOMEN AND PELVIS WITHOUT CONTRAST TECHNIQUE: Multidetector CT  imaging of the abdomen and pelvis was performed following the standard protocol without IV contrast. COMPARISON:  11/16/2020 FINDINGS: Lower chest: No acute abnormality. Dense coronary artery calcifications in the visualized right coronary artery. Hepatobiliary: No focal liver abnormality is seen. Status post cholecystectomy. No biliary dilatation. Pancreas: No focal abnormality or ductal dilatation. Spleen: No focal abnormality.  Normal size. Adrenals/Urinary Tract: Large cyst in the upper pole of the left kidney with layering high-density material, likely milk of calcium. No hydronephrosis. Adrenal glands unremarkable. Foley catheter is in place with decompressed urinary bladder. Bilateral perinephric stranding is stable since prior study. Stomach/Bowel: Left lower quadrant ostomy noted. No evidence of bowel obstruction. Stomach and small bowel decompressed. Gastrostomy tube within the stomach. Scattered colonic diverticula. Vascular/Lymphatic: Diffuse aortoiliac atherosclerosis. No evidence of aneurysm or adenopathy. Reproductive: No visible focal abnormality. Other: No free fluid or free air. Musculoskeletal: No acute bony abnormality. Diffuse degenerative changes in the lumbar spine. IMPRESSION: No ureteral stones or hydronephrosis. Foley catheter is within the bladder which is decompressed. Left lower quadrant ostomy noted.  No evidence of bowel obstruction. Colonic diverticulosis. Aortoiliac atherosclerosis. Electronically Signed   By: Rolm Baptise M.D.   On: 12/21/2020 20:34    Pending Labs Unresulted Labs (From admission, onward)          Start     Ordered   12/22/20 0500  CBC  Tomorrow morning,   R        12/21/20 2253   12/22/20 7371  Basic metabolic  panel  Tomorrow morning,   R        12/21/20 2253   12/21/20 2254  Lactic acid, plasma  ONCE - STAT,   STAT        12/21/20 2253   12/21/20 1646  SARS CORONAVIRUS 2 (TAT 6-24 HRS) Nasopharyngeal Nasopharyngeal Swab  (Tier 3 -  Symptomatic/asymptomatic with Precautions)  Once,   STAT       Question Answer Comment  Is this test for diagnosis or screening Screening   Symptomatic for COVID-19 as defined by CDC No   Hospitalized for COVID-19 No   Admitted to ICU for COVID-19 No   Previously tested for COVID-19 Unknown   Resident in a congregate (group) care setting Yes   Employed in healthcare setting No   Has patient completed COVID vaccination(s) (2 doses of Pfizer/Moderna 1 dose of [employer]) Unknown      12/21/20 1645   12/21/20 1625  Culture, blood (Routine x 2)  BLOOD CULTURE X 2,   STAT      12/21/20 1625          Vitals/Pain Today's Vitals   12/21/20 2130 12/21/20 2200 12/21/20 2330 12/22/20 0030  BP: 125/64 (!) 103/47 (!) 102/52 (!) 134/42  Pulse: (!) 115 (!) 106 (!) 103 (!) 106  Resp: 14 (!) 24 (!) 0 12  Temp:      TempSrc:      SpO2: 97% 96% 95% 96%  PainSc:        Isolation Precautions Airborne and Contact precautions  Medications Medications  0.9 %  sodium chloride infusion ( Intravenous New Bag/Given 12/22/20 0030)  ondansetron (ZOFRAN) injection 4 mg (has no administration in time range)  heparin injection 5,000 Units (5,000 Units Subcutaneous Given 12/22/20 0028)  acetaminophen (TYLENOL) tablet 650 mg (has no administration in time range)    Or  acetaminophen (TYLENOL) suppository 650 mg (has no administration in time range)  insulin aspart (novoLOG) injection 0-15 Units (has no administration in time range)  insulin aspart (novoLOG) injection 0-5 Units (has no administration in time range)  ceFEPIme (MAXIPIME) 2 g in sodium chloride 0.9 % 100 mL IVPB (has no administration in time range)  ondansetron (ZOFRAN) injection 4 mg (4 mg Intravenous Given 12/21/20 1714)  sodium chloride 0.9 % bolus 1,000 mL (0 mLs Intravenous Stopped 12/21/20 1833)  ceFEPIme (MAXIPIME) 2 g in sodium chloride 0.9 % 100 mL IVPB (0 g Intravenous Stopped 12/21/20 1923)  sodium chloride 0.9 % bolus 30 mL/kg  (Ideal) (0 mL/kg (Ideal) Intravenous Stopped 12/21/20 1923)  acetaminophen (TYLENOL) tablet 650 mg (650 mg Oral Given 12/21/20 1940)  0.9 %  sodium chloride infusion ( Intravenous Stopped 12/22/20 0028)    Mobility  Non-ambulatory

## 2020-12-22 NOTE — Consult Note (Addendum)
[facility] Nurse Consult Note: Reason for Consult:Consult requested for multiple wounds.  Pt is familiar to [facility] team from a previous admission on 3/4. Wound type: Sacrum with chronic stage 4 pressure injury; 6X11X.2cm, red moist wound bed, small amt tan drainage, exposed bone, no odor Sacrum nearby with chronic full thickness wound; .5X.5X.8cm, red and moist, small amt tan drainage, no odor  Right ischium with chronic Unstageable pressure injury; 3X4X1cm, 4 cm undermining,100% black eschar, mod amt tan drainage, some strong odor Left posterior leg with dark reddish purple deep tissue pressure injury; 3X2cm and 3X.1cm Left heel stage 2 pressure injury; 2X1X.1cm, dark red dry wound bed Left outer ankle stage 1 pressure injury; .8X.8cm dark red, nonblanching Left outer foot stage 2 pressure injury; .5X.5X.1cm and .3X.3X.1cm, red and dry Right heel stage 2 pressure injury; 2X1X.1cm, dark red dry wound bed Right outer ankle stage 1 pressure injury; .8X.8cm dark red, nonblanching Right posterior leg full thickness wound; 6X2X.2cm, 20% black, 80% red, small amt tan drainage Pressure Injury POA: Yes Dressing procedure/placement/frequency: Air mattress ordered to reduce pressure. Topical treatment orders provided for bedside nurses to perform as follows: Apply Santyl  and moist fluffed gauze to right ischium wound Q day, using swab to fill, then cover with foam dressing. Foam dressings to bilat heels, feet ankles, change Q 3 days or PRN soiling. Float heels to reduce pressure. Apply alginate Kellie Simmering # [phone]) to sacrum (2 wounds) Q day, then cover with foam dressing.  (Change foam dressing Q 3 days or PRN soiling. Moisten previous dressing with NS each time to remove.  Ostomy: Pt has a colostomy pouch which is intact with good seal; extra supplies: 2 piece pouch and barrier ring left in the room for staff nurses use PRN.  Use Supplies: barrier ring, # [id number], wafer #2, pouch # 338  One hour spent performing this  consult Please re-consult if further assistance is needed.  Thank-you,  Julien Girt MSN, [place], [place], [place], [place]

## 2020-12-22 NOTE — Progress Notes (Signed)
PHARMACY - PHYSICIAN COMMUNICATION CRITICAL VALUE ALERT - BLOOD CULTURE IDENTIFICATION (BCID)  Brent Mansel. is an 75 y.o. male with hx paraplegia (with chronic foley), G-Tube, colostomy and sacral pressure ulcer with left gluteal abscess and L4-L5 osteomyelitis and right psoas abscess ,who presented to [facility] on 12/21/2020 with a chief complaint of nausea, decrease urine and colostomy output.  She is currently on cefepime for sepsis and suspected UTI.   Name of physician (or Provider) Contacted: Dr. Bonner Puna  Current antibiotics: cefepime  Changes to prescribed antibiotics recommended:  - change to ceftriaxone 2gm IV q24h  Results for orders placed or performed during the hospital encounter of 12/21/20  Blood Culture ID Panel (Reflexed) (Collected: 12/21/2020  4:54 PM)  Result Value Ref Range   Enterococcus faecalis NOT DETECTED NOT DETECTED   Enterococcus Faecium NOT DETECTED NOT DETECTED   Listeria monocytogenes NOT DETECTED NOT DETECTED   Staphylococcus species NOT DETECTED NOT DETECTED   Staphylococcus aureus (BCID) NOT DETECTED NOT DETECTED   Staphylococcus epidermidis NOT DETECTED NOT DETECTED   Staphylococcus lugdunensis NOT DETECTED NOT DETECTED   Streptococcus species NOT DETECTED NOT DETECTED   Streptococcus agalactiae NOT DETECTED NOT DETECTED   Streptococcus pneumoniae NOT DETECTED NOT DETECTED   Streptococcus pyogenes NOT DETECTED NOT DETECTED   A.calcoaceticus-baumannii NOT DETECTED NOT DETECTED   Bacteroides fragilis NOT DETECTED NOT DETECTED   Enterobacterales DETECTED (A) NOT DETECTED   Enterobacter cloacae complex NOT DETECTED NOT DETECTED   Escherichia coli NOT DETECTED NOT DETECTED   Klebsiella aerogenes NOT DETECTED NOT DETECTED   Klebsiella oxytoca NOT DETECTED NOT DETECTED   Klebsiella pneumoniae NOT DETECTED NOT DETECTED   Proteus species DETECTED (A) NOT DETECTED   Salmonella species NOT DETECTED NOT DETECTED   Serratia marcescens NOT DETECTED NOT  DETECTED   Haemophilus influenzae NOT DETECTED NOT DETECTED   Neisseria meningitidis NOT DETECTED NOT DETECTED   Pseudomonas aeruginosa NOT DETECTED NOT DETECTED   Stenotrophomonas maltophilia NOT DETECTED NOT DETECTED   Candida albicans NOT DETECTED NOT DETECTED   Candida auris NOT DETECTED NOT DETECTED   Candida glabrata NOT DETECTED NOT DETECTED   Candida krusei NOT DETECTED NOT DETECTED   Candida parapsilosis NOT DETECTED NOT DETECTED   Candida tropicalis NOT DETECTED NOT DETECTED   Cryptococcus neoformans/gattii NOT DETECTED NOT DETECTED   CTX-M ESBL NOT DETECTED NOT DETECTED   Carbapenem resistance IMP NOT DETECTED NOT DETECTED   Carbapenem resistance KPC NOT DETECTED NOT DETECTED   Carbapenem resistance NDM NOT DETECTED NOT DETECTED   Carbapenem resist OXA 48 LIKE NOT DETECTED NOT DETECTED   Carbapenem resistance VIM NOT DETECTED NOT DETECTED    Brent [name] 12/22/2020  2:28 PM

## 2020-12-23 DIAGNOSIS — N179 Acute kidney failure, unspecified: Secondary | ICD-10-CM | POA: Diagnosis not present

## 2020-12-23 DIAGNOSIS — R6520 Severe sepsis without septic shock: Secondary | ICD-10-CM

## 2020-12-23 DIAGNOSIS — L89154 Pressure ulcer of sacral region, stage 4: Secondary | ICD-10-CM | POA: Diagnosis not present

## 2020-12-23 DIAGNOSIS — R110 Nausea: Secondary | ICD-10-CM

## 2020-12-23 DIAGNOSIS — N3001 Acute cystitis with hematuria: Secondary | ICD-10-CM | POA: Diagnosis not present

## 2020-12-23 DIAGNOSIS — A419 Sepsis, unspecified organism: Secondary | ICD-10-CM

## 2020-12-23 LAB — BASIC METABOLIC PANEL
Anion gap: 11 (ref 5–15)
BUN: 53 mg/dL — ABNORMAL HIGH (ref 8–23)
CO2: 20 mmol/L — ABNORMAL LOW (ref 22–32)
Calcium: 7.5 mg/dL — ABNORMAL LOW (ref 8.9–10.3)
Chloride: 105 mmol/L (ref 98–111)
Creatinine, Ser: 1.29 mg/dL — ABNORMAL HIGH (ref 0.61–1.24)
GFR, Estimated: 57 mL/min — ABNORMAL LOW (ref >60)
Glucose, Bld: 133 mg/dL — ABNORMAL HIGH (ref 70–99)
Potassium: 3.2 mmol/L — ABNORMAL LOW (ref 3.5–5.1)
Sodium: 136 mmol/L (ref 135–145)

## 2020-12-23 LAB — CBC WITH DIFFERENTIAL/PLATELET
Abs Immature Granulocytes: 0.04 10*3/uL (ref 0.00–0.07)
Basophils Absolute: 0 10*3/uL (ref 0.0–0.1)
Basophils Relative: 0 %
Eosinophils Absolute: 0 10*3/uL (ref 0.0–0.5)
Eosinophils Relative: 0 %
HCT: 26.1 % — ABNORMAL LOW (ref 39.0–52.0)
Hemoglobin: 7.6 g/dL — ABNORMAL LOW (ref 13.0–17.0)
Immature Granulocytes: 1 %
Lymphocytes Relative: 6 %
Lymphs Abs: 0.4 10*3/uL — ABNORMAL LOW (ref 0.7–4.0)
MCH: 23.5 pg — ABNORMAL LOW (ref 26.0–34.0)
MCHC: 29.1 g/dL — ABNORMAL LOW (ref 30.0–36.0)
MCV: 80.8 fL (ref 80.0–100.0)
Monocytes Absolute: 0.3 10*3/uL (ref 0.1–1.0)
Monocytes Relative: 5 %
Neutro Abs: 5.4 10*3/uL (ref 1.7–7.7)
Neutrophils Relative %: 88 %
Platelets: 200 10*3/uL (ref 150–400)
RBC: 3.23 MIL/uL — ABNORMAL LOW (ref 4.22–5.81)
RDW: 19.5 % — ABNORMAL HIGH (ref 11.5–15.5)
WBC: 6.1 10*3/uL (ref 4.0–10.5)
nRBC: 0 % (ref 0.0–0.2)

## 2020-12-23 LAB — GLUCOSE, CAPILLARY
Glucose-Capillary: 117 mg/dL — ABNORMAL HIGH (ref 70–99)
Glucose-Capillary: 112 mg/dL — ABNORMAL HIGH (ref 70–99)
Glucose-Capillary: 106 mg/dL — ABNORMAL HIGH (ref 70–99)
Glucose-Capillary: 186 mg/dL — ABNORMAL HIGH (ref 70–99)
Glucose-Capillary: 192 mg/dL — ABNORMAL HIGH (ref 70–99)

## 2020-12-23 NOTE — Progress Notes (Signed)
PROGRESS NOTE    Brent [name].  PYK:[id number] DOB: June 18, 1946 DOA: 12/21/2020 PCP: [name], [name], MD   Brief Narrative:  Brent Evansis a 74 y.o.malewith medical history significant of paraplegia secondary to transverse myelitis, dysphagia status post G-tube placement, stage IV sacral pressure ulcer with left gluteal abscess and L4-L5 osteomyelitis and right psoas abscess in 2021 and followed by infectious disease, status post diverting loop colostomy, CAD, insulin-dependent type 2 diabetes, hypertension, hyperlipidemia, history of malignant melanoma, obesity, OSA, hospital admission a month ago for UTI (culture grew Proteus mirabilis) and hemorrhagic cystitis in the setting of indwelling Foley catheter followed by urology. He was sent from SNF due to dry heaves, decreased ostomy and urine output, found to be tachycardic, hypotensive with leukocytosis, lactic acidosis, with purulent urine collection and ARF without obstruction on CT abd/pelvis. He was admitted for IVF and antibiotics which have been given with hemodynamic stability and improvement in renal failure   Assessment & Plan:   Principal Problem:   UTI (urinary tract infection) Active Problems:   AKI (acute kidney injury) ([place])   Severe sepsis ([facility])   Sacral decubitus ulcer   Nausea   Severe sepsis secondary to CAUTI with concurrent bacteremia, POA 2/2 Enterobacter/Proteus species -Patient admitted tachycardic hypotensive with leukocytosis lactic acidosis and indwelling Foley concerning for UTI.   -Cultures show Enterobacter and Proteus species in the blood likely originating from urine.   -Continue cefepime per sensitivities, follow repeat blood cultures to ensure clearance.  AKI without history of CKD - Likely prerenal azotemia from poor oral intake/dehydration and hypotension in the setting of severe sepsis. CT renal stone study without findings to suggest obstructive uropathy. -IV fluid  hydration.  Continue to monitor renal function and urine output.  Avoid nephrotoxic agents/hold home lisinopril.  Stage IV sacral decubitus ulcer, POA - No obvious signs of infection on exam.  Patient is not endorsing any pain. -Wound care following  Nausea, resolved Likely due to UTI.  He is not vomiting.  Abdominal exam benign.  No elevation of LFTs.  CT without evidence of bowel obstruction. -Antiemetic as needed  Insulin-dependent type 2 diabetes, uncontrolled - A1c 8.2 on 11/16/2020. -Hold basal insulin given poor p.o. intake in the setting of nausea as above  -Continue to follow with sliding scale and hypoglycemic protocol.    Hyperlipidemia -Continue Lipitor and Vascepa  GERD -Continue PPI  Depression -Continue sertraline, trazodone  BPH -Continue Flomax  DVT prophylaxis: Subcutaneous heparin Code Status: Full code-discussed with the patient. Family Communication: No family available at this time.  Status is: Inpatient  Dispo: The patient is from: SNF              Anticipated d/c is to: Same              Anticipated d/c date is: 72+ hours              Patient currently not medically stable for discharge  Consultants:   None  Procedures:   None  Antimicrobials:  Ceftriaxone  Subjective: No acute issues or events overnight denies vomiting diarrhea constipation headache fevers or chills.  Patient does endorse nausea but improving compared to admission.  Objective: Vitals:   12/22/20 0817 12/22/20 1334 12/22/20 2116 12/23/20 0513  BP: (!) 118/51 (!) 111/44 (!) 129/55 (!) 118/59  Pulse: 95 95 91 76  Resp: 20 18 16 18   Temp: 98.1 F (36.7 C) 97.9 F (36.6 C) 98.8 F (37.1 C) 99.9 F (  37.7 C)  TempSrc: Oral Oral Oral Oral  SpO2: 95% 96% 94% 97%  Height:        Intake/Output Summary (Last 24 hours) at 12/23/2020 0813 Last data filed at 12/23/2020 0530 Gross per 24 hour  Intake 2358.7 ml  Output 1825 ml  Net 533.7 ml   There were no vitals  filed for this visit.  Examination:  General exam: Appears calm and comfortable  Respiratory system: Clear to auscultation. Respiratory effort normal. Cardiovascular system: S1 & S2 heard, RRR. No JVD, murmurs, rubs, gallops or clicks. No pedal edema. Gastrointestinal system: Abdomen is nondistended, soft and nontender. No organomegaly or masses felt. Normal bowel sounds heard. Central nervous system: Alert and oriented. No focal neurological deficits. Extremities: Symmetric 5 x 5 power. Skin: No rashes, lesions or ulcers   Data Reviewed: I have personally reviewed following labs and imaging studies  CBC: Recent Labs  Lab 12/21/20 1650 12/22/20 0457 12/23/20 0401  WBC 12.8* 10.6* 6.1  NEUTROABS 11.8*  --  5.4  HGB 9.2* 8.6* 7.6*  HCT 30.5* 28.7* 26.1*  MCV 78.8* 78.4* 80.8  PLT 285 255 732   Basic Metabolic Panel: Recent Labs  Lab 12/21/20 1650 12/22/20 0457 12/23/20 0401  NA 135 138 136  K 4.3 3.8 3.2*  CL 99 106 105  CO2 23 20* 20*  GLUCOSE 216* 183* 133*  BUN 60* 53* 53*  CREATININE 3.69* 2.47* 1.29*  CALCIUM 8.0* 7.6* 7.5*   GFR: CrCl cannot be calculated (Unknown ideal weight.). Liver Function Tests: Recent Labs  Lab 12/21/20 1650  AST 14*  ALT 12  ALKPHOS 89  BILITOT 0.7  PROT 6.0*  ALBUMIN 2.4*   No results for input(s): LIPASE, AMYLASE in the last 168 hours. No results for input(s): AMMONIA in the last 168 hours. Coagulation Profile: Recent Labs  Lab 12/21/20 1650  INR 1.2   Cardiac Enzymes: No results for input(s): CKTOTAL, CKMB, CKMBINDEX, TROPONINI in the last 168 hours. BNP (last 3 results) No results for input(s): PROBNP in the last 8760 hours. HbA1C: No results for input(s): HGBA1C in the last 72 hours. CBG: Recent Labs  Lab 12/22/20 1625 12/22/20 1959 12/22/20 2354 12/23/20 0421 12/23/20 0717  GLUCAP 136* 122* 133* 117* 112*   Lipid Profile: No results for input(s): CHOL, HDL, LDLCALC, TRIG, CHOLHDL, LDLDIRECT in the last  72 hours. Thyroid Function Tests: No results for input(s): TSH, T4TOTAL, FREET4, T3FREE, THYROIDAB in the last 72 hours. Anemia Panel: No results for input(s): VITAMINB12, FOLATE, FERRITIN, TIBC, IRON, RETICCTPCT in the last 72 hours. Sepsis Labs: Recent Labs  Lab 12/21/20 1640 12/21/20 1928 12/22/20 0457  LATICACIDVEN 2.2* 3.1* 1.1    Recent Results (from the past 240 hour(s))  Culture, blood (Routine x 2)     Status: None (Preliminary result)   Collection Time: 12/21/20  4:50 PM   Specimen: BLOOD  Result Value Ref Range Status   Specimen Description   Final    BLOOD LEFT ANTECUBITAL Performed at [facility], [place] [address] [place], [place] [address]    Special Requests   Final    BOTTLES DRAWN AEROBIC AND ANAEROBIC Blood Culture adequate volume Performed at [place] [address] [place], [place] [address]    Culture  Setup Time   Final    GRAM NEGATIVE RODS IN BOTH AEROBIC AND ANAEROBIC BOTTLES CRITICAL VALUE NOTED.  VALUE IS CONSISTENT WITH PREVIOUSLY REPORTED AND CALLED VALUE. Performed at [facility], [place]  [address], [place], [place] [address]    Culture GRAM NEGATIVE RODS  Final   Report Status PENDING  Incomplete  Culture, blood (Routine x 2)     Status: None (Preliminary result)   Collection Time: 12/21/20  4:54 PM   Specimen: BLOOD RIGHT HAND  Result Value Ref Range Status   Specimen Description   Final    BLOOD RIGHT HAND Performed at [facility], [place] [address], [place], [place] [address]    Special Requests   Final    BOTTLES DRAWN AEROBIC AND ANAEROBIC Blood Culture adequate volume Performed at [place] [address] [place], [place] [address]    Culture  Setup Time   Final    GRAM NEGATIVE RODS IN BOTH AEROBIC AND ANAEROBIC BOTTLES CRITICAL RESULT CALLED TO, READ BACK BY AND VERIFIED WITH: PHARMD A. PHAM [id number] [id number] FCP Performed at [place] [address] [place], [place] [address]    Culture GRAM NEGATIVE RODS  Final   Report Status PENDING  Incomplete  Blood Culture ID Panel (Reflexed)     Status: Abnormal   Collection Time: 12/21/20  4:54 PM  Result Value Ref Range Status   Enterococcus faecalis NOT DETECTED NOT DETECTED Final   Enterococcus Faecium NOT DETECTED NOT DETECTED Final   Listeria monocytogenes NOT DETECTED NOT DETECTED Final   Staphylococcus species NOT DETECTED NOT DETECTED Final   Staphylococcus aureus (BCID) NOT DETECTED NOT DETECTED Final   Staphylococcus epidermidis NOT DETECTED NOT DETECTED Final   Staphylococcus lugdunensis NOT DETECTED NOT DETECTED Final   Streptococcus species NOT DETECTED NOT DETECTED Final   Streptococcus agalactiae NOT DETECTED NOT DETECTED Final   Streptococcus pneumoniae NOT DETECTED NOT DETECTED Final   Streptococcus pyogenes NOT DETECTED NOT DETECTED Final   A.calcoaceticus-baumannii NOT DETECTED NOT DETECTED Final   Bacteroides fragilis NOT DETECTED NOT DETECTED Final   Enterobacterales DETECTED (A) NOT DETECTED Final    Comment: Enterobacterales represent a large order of gram negative bacteria, not a single organism. CRITICAL RESULT CALLED TO, READ BACK BY AND VERIFIED WITH: PHARMD A. PHAM [id number] FCP    Enterobacter cloacae complex NOT DETECTED NOT DETECTED Final   Escherichia coli NOT DETECTED NOT DETECTED Final   Klebsiella aerogenes NOT DETECTED NOT DETECTED Final   Klebsiella oxytoca NOT DETECTED NOT DETECTED Final   Klebsiella pneumoniae NOT DETECTED NOT DETECTED Final   Proteus species DETECTED (A) NOT DETECTED Final    Comment: CRITICAL RESULT CALLED TO, READ BACK BY AND VERIFIED WITH: PHARMD A. PHAM 1423 [id number] FCP    Salmonella species NOT DETECTED NOT DETECTED Final   Serratia marcescens NOT DETECTED NOT DETECTED Final   Haemophilus influenzae NOT DETECTED NOT DETECTED Final   Neisseria meningitidis NOT DETECTED NOT DETECTED Final   Pseudomonas  aeruginosa NOT DETECTED NOT DETECTED Final   Stenotrophomonas maltophilia NOT DETECTED NOT DETECTED Final   Candida albicans NOT DETECTED NOT DETECTED Final   Candida auris NOT DETECTED NOT DETECTED Final   Candida glabrata NOT DETECTED NOT DETECTED Final   Candida krusei NOT DETECTED NOT DETECTED Final   Candida parapsilosis NOT DETECTED NOT DETECTED Final   Candida tropicalis NOT DETECTED NOT DETECTED Final   Cryptococcus neoformans/gattii NOT DETECTED NOT DETECTED Final   CTX-M ESBL NOT DETECTED NOT DETECTED Final   Carbapenem resistance IMP NOT DETECTED NOT DETECTED Final   Carbapenem resistance KPC NOT DETECTED NOT DETECTED Final   Carbapenem resistance NDM NOT DETECTED NOT DETECTED Final  Carbapenem resist OXA 48 LIKE NOT DETECTED NOT DETECTED Final   Carbapenem resistance VIM NOT DETECTED NOT DETECTED Final    Comment: Performed at [facility], [place] [address], [place], [place] [address]  SARS CORONAVIRUS 2 (TAT 6-24 HRS) Nasopharyngeal Nasopharyngeal Swab     Status: None   Collection Time: 12/21/20  5:04 PM   Specimen: Nasopharyngeal Swab  Result Value Ref Range Status   SARS Coronavirus 2 NEGATIVE NEGATIVE Final    Comment: (NOTE) SARS-CoV-2 target nucleic acids are NOT DETECTED.  The SARS-CoV-2 RNA is generally detectable in upper and lower respiratory specimens during the acute phase of infection. Negative results do not preclude SARS-CoV-2 infection, do not rule out co-infections with other pathogens, and should not be used as the sole basis for treatment or other patient management decisions. Negative results must be combined with clinical observations, patient history, and epidemiological information. The expected result is Negative.  Fact Sheet for Patients: SugarRoll.be  Fact Sheet for Healthcare Providers: https://www.woods-mathews.com/  This test is not yet approved or cleared by the [place] FDA and  has  been authorized for detection and/or diagnosis of SARS-CoV-2 by FDA under an Emergency Use Authorization (EUA). This EUA will remain  in effect (meaning this test can be used) for the duration of the COVID-19 declaration under Se ction 564(b)(1) of the Act, 21 U.S.C. section 360bbb-3(b)(1), unless the authorization is terminated or revoked sooner.  Performed at [facility], [place] [address], [place], [place] [address]          Radiology Studies: DG Chest 2 View  Result Date: 12/21/2020 CLINICAL DATA:  Suspected sepsis EXAM: CHEST - 2 VIEW COMPARISON:  11/16/2020 FINDINGS: Lingular opacity is unchanged, likely scarring. Right lung clear. Heart is normal size. No effusions or acute bony abnormality. IMPRESSION: Lingular scarring.  No active disease. Electronically Signed   By: Rolm Baptise M.D.   On: 12/21/2020 17:33   CT Renal Stone Study  Result Date: 12/21/2020 CLINICAL DATA:  Nausea. Decreased output through colostomy and decreased urine output. EXAM: CT ABDOMEN AND PELVIS WITHOUT CONTRAST TECHNIQUE: Multidetector CT imaging of the abdomen and pelvis was performed following the standard protocol without IV contrast. COMPARISON:  11/16/2020 FINDINGS: Lower chest: No acute abnormality. Dense coronary artery calcifications in the visualized right coronary artery. Hepatobiliary: No focal liver abnormality is seen. Status post cholecystectomy. No biliary dilatation. Pancreas: No focal abnormality or ductal dilatation. Spleen: No focal abnormality.  Normal size. Adrenals/Urinary Tract: Large cyst in the upper pole of the left kidney with layering high-density material, likely milk of calcium. No hydronephrosis. Adrenal glands unremarkable. Foley catheter is in place with decompressed urinary bladder. Bilateral perinephric stranding is stable since prior study. Stomach/Bowel: Left lower quadrant ostomy noted. No evidence of bowel obstruction. Stomach and small bowel decompressed. Gastrostomy  tube within the stomach. Scattered colonic diverticula. Vascular/Lymphatic: Diffuse aortoiliac atherosclerosis. No evidence of aneurysm or adenopathy. Reproductive: No visible focal abnormality. Other: No free fluid or free air. Musculoskeletal: No acute bony abnormality. Diffuse degenerative changes in the lumbar spine. IMPRESSION: No ureteral stones or hydronephrosis. Foley catheter is within the bladder which is decompressed. Left lower quadrant ostomy noted.  No evidence of bowel obstruction. Colonic diverticulosis. Aortoiliac atherosclerosis. Electronically Signed   By: Rolm Baptise M.D.   On: 12/21/2020 20:34        Scheduled Meds: . aspirin EC  81 mg Oral QHS  . atorvastatin  20 mg Oral Daily  . Chlorhexidine Gluconate  Cloth  6 each Topical Daily  . collagenase   Topical Daily  . ferrous gluconate  324 mg Oral Daily  . fluticasone  2 spray Each Nare BID  . gabapentin  300 mg Oral QHS  . heparin  5,000 Units Subcutaneous Q8H  . icosapent Ethyl  2 g Oral BID  . insulin aspart  0-9 Units Subcutaneous Q4H  . pantoprazole  40 mg Oral Daily  . senna-docusate  2 tablet Oral Daily  . sertraline  25 mg Oral Daily  . sertraline  50 mg Oral Daily  . tamsulosin  0.4 mg Oral QPC supper  . traZODone  50 mg Oral QHS   Continuous Infusions: . cefTRIAXone (ROCEPHIN)  IV 2 g (12/22/20 1735)  . lactated ringers 125 mL/hr at 12/23/20 0544     LOS: 2 days   Time spent: 42min  Jensyn Cambria C Aftin Lye, DO [facility]  If 7PM-7AM, please contact night-coverage [email or web address]  12/23/2020, 8:13 AM

## 2020-12-23 NOTE — Care Management Important Message (Signed)
Important Message  Patient Details IM Letter given to the Patient. Name: Brent [name]. MRN: [id number] Date of Birth: October 02, 1945   Medicare Important Message Given:  Yes     Kerin Salen 12/23/2020, 4:19 PM

## 2020-12-23 NOTE — Progress Notes (Signed)
Patient with low apetite, denies nausea. He is having stool from colostomy and per rectum. MD was notified.

## 2020-12-24 DIAGNOSIS — N179 Acute kidney failure, unspecified: Secondary | ICD-10-CM | POA: Diagnosis not present

## 2020-12-24 DIAGNOSIS — R110 Nausea: Secondary | ICD-10-CM | POA: Diagnosis not present

## 2020-12-24 DIAGNOSIS — L89154 Pressure ulcer of sacral region, stage 4: Secondary | ICD-10-CM | POA: Diagnosis not present

## 2020-12-24 DIAGNOSIS — N3001 Acute cystitis with hematuria: Secondary | ICD-10-CM | POA: Diagnosis not present

## 2020-12-24 LAB — CBC
HCT: 31.3 % — ABNORMAL LOW (ref 39.0–52.0)
Hemoglobin: 9.2 g/dL — ABNORMAL LOW (ref 13.0–17.0)
MCH: 23.7 pg — ABNORMAL LOW (ref 26.0–34.0)
MCHC: 29.4 g/dL — ABNORMAL LOW (ref 30.0–36.0)
MCV: 80.7 fL (ref 80.0–100.0)
Platelets: 211 10*3/uL (ref 150–400)
RBC: 3.88 MIL/uL — ABNORMAL LOW (ref 4.22–5.81)
RDW: 19.7 % — ABNORMAL HIGH (ref 11.5–15.5)
WBC: 8.3 10*3/uL (ref 4.0–10.5)
nRBC: 0 % (ref 0.0–0.2)

## 2020-12-24 LAB — CULTURE, BLOOD (ROUTINE X 2)
Special Requests: ADEQUATE
Special Requests: ADEQUATE

## 2020-12-24 LAB — BASIC METABOLIC PANEL
Anion gap: 10 (ref 5–15)
BUN: 38 mg/dL — ABNORMAL HIGH (ref 8–23)
CO2: 23 mmol/L (ref 22–32)
Calcium: 8.1 mg/dL — ABNORMAL LOW (ref 8.9–10.3)
Chloride: 107 mmol/L (ref 98–111)
Creatinine, Ser: 0.87 mg/dL (ref 0.61–1.24)
GFR, Estimated: >60 mL/min (ref >60)
Glucose, Bld: 168 mg/dL — ABNORMAL HIGH (ref 70–99)
Potassium: 3.2 mmol/L — ABNORMAL LOW (ref 3.5–5.1)
Sodium: 140 mmol/L (ref 135–145)

## 2020-12-24 LAB — GLUCOSE, CAPILLARY
Glucose-Capillary: 126 mg/dL — ABNORMAL HIGH (ref 70–99)
Glucose-Capillary: 139 mg/dL — ABNORMAL HIGH (ref 70–99)
Glucose-Capillary: 167 mg/dL — ABNORMAL HIGH (ref 70–99)
Glucose-Capillary: 175 mg/dL — ABNORMAL HIGH (ref 70–99)
Glucose-Capillary: 183 mg/dL — ABNORMAL HIGH (ref 70–99)
Glucose-Capillary: 229 mg/dL — ABNORMAL HIGH (ref 70–99)

## 2020-12-24 LAB — URINE CULTURE: Culture: >=100000 — AB

## 2020-12-24 MED ORDER — CALCIUM CARBONATE ANTACID 500 MG PO CHEW
1.0000 | CHEWABLE_TABLET | Freq: Three times a day (TID) | ORAL | Status: DC | PRN
  Administered 2020-12-24: 200 mg via ORAL
  Filled 2020-12-24: qty 1

## 2020-12-24 NOTE — Progress Notes (Signed)
PROGRESS NOTE    Brent [name].  VQM:[id number] DOB: [birth year] DOA: 12/21/2020 PCP: [name], [name], MD   Brief Narrative:  ZADE Brent Evansis a 75 y.o.malewith medical history significant of paraplegia secondary to transverse myelitis, dysphagia status post G-tube placement, stage IV sacral pressure ulcer with left gluteal abscess and L4-L5 osteomyelitis and right psoas abscess in 2021 and followed by infectious disease, status post diverting loop colostomy, CAD, insulin-dependent type 2 diabetes, hypertension, hyperlipidemia, history of malignant melanoma, obesity, OSA, hospital admission a month ago for UTI (culture grew Proteus mirabilis) and hemorrhagic cystitis in the setting of indwelling Foley catheter followed by urology. He was sent from SNF due to dry heaves, decreased ostomy and urine output, found to be tachycardic, hypotensive with leukocytosis, lactic acidosis, with purulent urine collection and ARF without obstruction on CT abd/pelvis. He was admitted for IVF and antibiotics which have been given with hemodynamic stability and improvement in renal failure   Assessment & Plan:   Principal Problem:   UTI (urinary tract infection) Active Problems:   AKI (acute kidney injury) (Brent [name])   Severe sepsis (HCC)   Sacral decubitus ulcer   Nausea   Severe sepsis secondary to CAUTI with concurrent bacteremia, POA 2/2 Enterobacter/Proteus species -Patient admitted tachycardic hypotensive with leukocytosis lactic acidosis and indwelling Foley concerning for UTI.   -Cultures show Enterobacter and Proteus species in the blood likely originating from urine.   -Continue cefepime per sensitivities, follow repeat blood cultures to ensure clearance.  AKI without history of CKD - Likely prerenal azotemia from poor oral intake/dehydration and hypotension in the setting of severe sepsis. CT renal stone study without findings to suggest obstructive uropathy. -IV fluid  hydration.  Continue to monitor renal function and urine output.  Avoid nephrotoxic agents/hold home lisinopril.  Stage IV sacral decubitus ulcer, POA - No obvious signs of infection on exam.  Patient is not endorsing any pain. -Wound care following  Nausea, resolved Likely due to UTI.  He is not vomiting.  Abdominal exam benign.  No elevation of LFTs.  CT without evidence of bowel obstruction. -Antiemetic as needed  Insulin-dependent type 2 diabetes, uncontrolled - A1c 8.2 on 11/16/2020. -Hold basal insulin given poor p.o. intake in the setting of nausea as above  -Continue to follow with sliding scale and hypoglycemic protocol.    Hyperlipidemia -Continue Lipitor and Vascepa  GERD -Continue PPI  Depression -Continue sertraline, trazodone  BPH -Continue Flomax  DVT prophylaxis: Subcutaneous heparin Code Status: Full code-discussed with the patient. Family Communication: No family available at this time.  Status is: Inpatient  Dispo: The patient is from: SNF              Anticipated d/c is to: Same              Anticipated d/c date is: 77+ hours              Patient currently not medically stable for discharge  Consultants:   None  Procedures:   None  Antimicrobials:  Ceftriaxone  Subjective: No acute issues or events overnight denies vomiting diarrhea constipation headache fevers or chills.  Patient does endorse nausea but improving compared to admission.  Objective: Vitals:   12/23/20 1559 12/23/20 1630 12/23/20 2108 12/24/20 0510  BP: (!) 135/118 (!) 130/54 130/68 138/85  Pulse: 94  86 85  Resp: 20  (!) 22   Temp: 99.3 F (37.4 C)  100 F (37.8 C) 98.8 F (37.1 C)  TempSrc:  Oral  Oral Oral  SpO2: 97%  96% 98%  Height:        Intake/Output Summary (Last 24 hours) at 12/24/2020 0747 Last data filed at 12/24/2020 0530 Gross per 24 hour  Intake 3419.86 ml  Output 2851 ml  Net 568.86 ml   There were no vitals filed for this  visit.  Examination:  General exam: Appears calm and comfortable  Respiratory system: Clear to auscultation. Respiratory effort normal. Cardiovascular system: S1 & S2 heard, RRR. No JVD, murmurs, rubs, gallops or clicks. No pedal edema. Gastrointestinal system: Abdomen is nondistended, soft and nontender. No organomegaly or masses felt. Normal bowel sounds heard. Central nervous system: Alert and oriented. No focal neurological deficits. Extremities: Symmetric 5 x 5 power. Skin: No rashes, lesions or ulcers   Data Reviewed: I have personally reviewed following labs and imaging studies  CBC: Recent Labs  Lab 12/21/20 1650 12/22/20 0457 12/23/20 0401 12/24/20 0356  WBC 12.8* 10.6* 6.1 8.3  NEUTROABS 11.8*  --  5.4  --   HGB 9.2* 8.6* 7.6* 9.2*  HCT 30.5* 28.7* 26.1* 31.3*  MCV 78.8* 78.4* 80.8 80.7  PLT 285 255 200 741   Basic Metabolic Panel: Recent Labs  Lab 12/21/20 1650 12/22/20 0457 12/23/20 0401 12/24/20 0356  NA 135 138 136 140  K 4.3 3.8 3.2* 3.2*  CL 99 106 105 107  CO2 23 20* 20* 23  GLUCOSE 216* 183* 133* 168*  BUN 60* 53* 53* 38*  CREATININE 3.69* 2.47* 1.29* 0.87  CALCIUM 8.0* 7.6* 7.5* 8.1*   GFR: CrCl cannot be calculated (Unknown ideal weight.). Liver Function Tests: Recent Labs  Lab 12/21/20 1650  AST 14*  ALT 12  ALKPHOS 89  BILITOT 0.7  PROT 6.0*  ALBUMIN 2.4*   No results for input(s): LIPASE, AMYLASE in the last 168 hours. No results for input(s): AMMONIA in the last 168 hours. Coagulation Profile: Recent Labs  Lab 12/21/20 1650  INR 1.2   Cardiac Enzymes: No results for input(s): CKTOTAL, CKMB, CKMBINDEX, TROPONINI in the last 168 hours. BNP (last 3 results) No results for input(s): PROBNP in the last 8760 hours. HbA1C: No results for input(s): HGBA1C in the last 72 hours. CBG: Recent Labs  Lab 12/23/20 1324 12/23/20 1724 12/23/20 2001 12/24/20 0019 12/24/20 0359  GLUCAP 106* 186* 192* 139* 126*   Lipid Profile: No  results for input(s): CHOL, HDL, LDLCALC, TRIG, CHOLHDL, LDLDIRECT in the last 72 hours. Thyroid Function Tests: No results for input(s): TSH, T4TOTAL, FREET4, T3FREE, THYROIDAB in the last 72 hours. Anemia Panel: No results for input(s): VITAMINB12, FOLATE, FERRITIN, TIBC, IRON, RETICCTPCT in the last 72 hours. Sepsis Labs: Recent Labs  Lab 12/21/20 1640 12/21/20 1928 12/22/20 0457  LATICACIDVEN 2.2* 3.1* 1.1    Recent Results (from the past 240 hour(s))  Culture, blood (Routine x 2)     Status: Abnormal (Preliminary result)   Collection Time: 12/21/20  4:50 PM   Specimen: BLOOD  Result Value Ref Range Status   Specimen Description   Final    BLOOD LEFT ANTECUBITAL Performed at [facility], [place] [address] [place], [place] [address]    Special Requests   Final    BOTTLES DRAWN AEROBIC AND ANAEROBIC Blood Culture adequate volume Performed at [place] [address], [place], [place] [address]    Culture  Setup Time   Final    GRAM NEGATIVE RODS IN BOTH AEROBIC AND ANAEROBIC BOTTLES CRITICAL VALUE NOTED.  VALUE  IS CONSISTENT WITH PREVIOUSLY REPORTED AND CALLED VALUE. Performed at [facility], [place] [address] [place], [place] [address]    Culture PROTEUS MIRABILIS (A)  Final   Report Status PENDING  Incomplete  Culture, blood (Routine x 2)     Status: Abnormal (Preliminary result)   Collection Time: 12/21/20  4:54 PM   Specimen: BLOOD RIGHT HAND  Result Value Ref Range Status   Specimen Description   Final    BLOOD RIGHT HAND Performed at [place] [address] [place], [place] [address]    Special Requests   Final    BOTTLES DRAWN AEROBIC AND ANAEROBIC Blood Culture adequate volume Performed at [place] [address] [place], [place] [address]    Culture  Setup Time   Final    GRAM NEGATIVE RODS IN BOTH AEROBIC AND ANAEROBIC BOTTLES CRITICAL RESULT CALLED TO, READ  BACK BY AND VERIFIED WITH: PHARMD A. PHAM [id number] FCP    Culture (A)  Final    PROTEUS MIRABILIS SUSCEPTIBILITIES TO FOLLOW Performed at [facility], [place] [address] [place], [place] [address]    Report Status PENDING  Incomplete  Blood Culture ID Panel (Reflexed)     Status: Abnormal   Collection Time: 12/21/20  4:54 PM  Result Value Ref Range Status   Enterococcus faecalis NOT DETECTED NOT DETECTED Final   Enterococcus Faecium NOT DETECTED NOT DETECTED Final   Listeria monocytogenes NOT DETECTED NOT DETECTED Final   Staphylococcus species NOT DETECTED NOT DETECTED Final   Staphylococcus aureus (BCID) NOT DETECTED NOT DETECTED Final   Staphylococcus epidermidis NOT DETECTED NOT DETECTED Final   Staphylococcus lugdunensis NOT DETECTED NOT DETECTED Final   Streptococcus species NOT DETECTED NOT DETECTED Final   Streptococcus agalactiae NOT DETECTED NOT DETECTED Final   Streptococcus pneumoniae NOT DETECTED NOT DETECTED Final   Streptococcus pyogenes NOT DETECTED NOT DETECTED Final   A.calcoaceticus-baumannii NOT DETECTED NOT DETECTED Final   Bacteroides fragilis NOT DETECTED NOT DETECTED Final   Enterobacterales DETECTED (A) NOT DETECTED Final    Comment: Enterobacterales represent a large order of gram negative bacteria, not a single organism. CRITICAL RESULT CALLED TO, READ BACK BY AND VERIFIED WITH: PHARMD A. PHAM [id number] FCP    Enterobacter cloacae complex NOT DETECTED NOT DETECTED Final   Escherichia coli NOT DETECTED NOT DETECTED Final   Klebsiella aerogenes NOT DETECTED NOT DETECTED Final   Klebsiella oxytoca NOT DETECTED NOT DETECTED Final   Klebsiella pneumoniae NOT DETECTED NOT DETECTED Final   Proteus species DETECTED (A) NOT DETECTED Final    Comment: CRITICAL RESULT CALLED TO, READ BACK BY AND VERIFIED WITH: PHARMD A. PHAM [id number] FCP    Salmonella species NOT DETECTED NOT DETECTED Final   Serratia marcescens NOT DETECTED NOT DETECTED Final    Haemophilus influenzae NOT DETECTED NOT DETECTED Final   Neisseria meningitidis NOT DETECTED NOT DETECTED Final   Pseudomonas aeruginosa NOT DETECTED NOT DETECTED Final   Stenotrophomonas maltophilia NOT DETECTED NOT DETECTED Final   Candida albicans NOT DETECTED NOT DETECTED Final   Candida auris NOT DETECTED NOT DETECTED Final   Candida glabrata NOT DETECTED NOT DETECTED Final   Candida krusei NOT DETECTED NOT DETECTED Final   Candida parapsilosis NOT DETECTED NOT DETECTED Final   Candida tropicalis NOT DETECTED NOT DETECTED Final   Cryptococcus neoformans/gattii NOT DETECTED NOT DETECTED Final   CTX-M ESBL NOT DETECTED NOT DETECTED Final   Carbapenem resistance IMP NOT DETECTED  NOT DETECTED Final   Carbapenem resistance KPC NOT DETECTED NOT DETECTED Final   Carbapenem resistance NDM NOT DETECTED NOT DETECTED Final   Carbapenem resist OXA 48 LIKE NOT DETECTED NOT DETECTED Final   Carbapenem resistance VIM NOT DETECTED NOT DETECTED Final    Comment: Performed at [facility], [place] [address] [place], [place] [address]  SARS CORONAVIRUS 2 (TAT 6-24 HRS) Nasopharyngeal Nasopharyngeal Swab     Status: None   Collection Time: 12/21/20  5:04 PM   Specimen: Nasopharyngeal Swab  Result Value Ref Range Status   SARS Coronavirus 2 NEGATIVE NEGATIVE Final    Comment: (NOTE) SARS-CoV-2 target nucleic acids are NOT DETECTED.  The SARS-CoV-2 RNA is generally detectable in upper and lower respiratory specimens during the acute phase of infection. Negative results do not preclude SARS-CoV-2 infection, do not rule out co-infections with other pathogens, and should not be used as the sole basis for treatment or other patient management decisions. Negative results must be combined with clinical observations, patient history, and epidemiological information. The expected result is Negative.  Fact Sheet for Patients: SugarRoll.be  Fact Sheet for Healthcare  Providers: https://www.woods-mathews.com/  This test is not yet approved or cleared by the [place] FDA and  has been authorized for detection and/or diagnosis of SARS-CoV-2 by FDA under an Emergency Use Authorization (EUA). This EUA will remain  in effect (meaning this test can be used) for the duration of the COVID-19 declaration under Se ction 564(b)(1) of the Act, 21 U.S.C. section 360bbb-3(b)(1), unless the authorization is terminated or revoked sooner.  Performed at [facility], [place] [address], [place], [place] [address]   Culture, Urine     Status: Abnormal (Preliminary result)   Collection Time: 12/21/20  5:04 PM   Specimen: Urine, Clean Catch  Result Value Ref Range Status   Specimen Description   Final    URINE, CLEAN CATCH Performed at [facility], [place] [address] [place], [place] [address]    Special Requests   Final    NONE Performed at [facility], [place] [address] [place], [place] [address]    Culture (A)  Final    >=100,000 COLONIES/mL PROTEUS MIRABILIS SUSCEPTIBILITIES TO FOLLOW Performed at [facility], [place] [address], [place],  [address]    Report Status PENDING  Incomplete         Radiology Studies: No results found.      Scheduled Meds: . aspirin EC  81 mg Oral QHS  . atorvastatin  20 mg Oral Daily  . Chlorhexidine Gluconate Cloth  6 each Topical Daily  . collagenase   Topical Daily  . ferrous gluconate  324 mg Oral Daily  . fluticasone  2 spray Each Nare BID  . gabapentin  300 mg Oral QHS  . heparin  5,000 Units Subcutaneous Q8H  . icosapent Ethyl  2 g Oral BID  . insulin aspart  0-9 Units Subcutaneous Q4H  . pantoprazole  40 mg Oral Daily  . senna-docusate  2 tablet Oral Daily  . sertraline  25 mg Oral Daily  . sertraline  50 mg Oral Daily  . tamsulosin  0.4 mg Oral QPC supper  . traZODone  50 mg Oral QHS   Continuous Infusions: . cefTRIAXone (ROCEPHIN)   IV 2 g (12/23/20 1830)  . lactated ringers 125 mL/hr at 12/24/20 0159     LOS: 3 days   Time spent: 57min  Nerea Bordenave C Shantae Vantol, DO  [facility]  If 7PM-7AM, please contact night-coverage [email or web address]  12/24/2020, 7:47 AM

## 2020-12-25 DIAGNOSIS — N179 Acute kidney failure, unspecified: Secondary | ICD-10-CM | POA: Diagnosis not present

## 2020-12-25 DIAGNOSIS — L89154 Pressure ulcer of sacral region, stage 4: Secondary | ICD-10-CM | POA: Diagnosis not present

## 2020-12-25 DIAGNOSIS — N3001 Acute cystitis with hematuria: Secondary | ICD-10-CM | POA: Diagnosis not present

## 2020-12-25 DIAGNOSIS — R110 Nausea: Secondary | ICD-10-CM | POA: Diagnosis not present

## 2020-12-25 LAB — BASIC METABOLIC PANEL
Anion gap: 8 (ref 5–15)
BUN: 23 mg/dL (ref 8–23)
CO2: 21 mmol/L — ABNORMAL LOW (ref 22–32)
Calcium: 7.6 mg/dL — ABNORMAL LOW (ref 8.9–10.3)
Chloride: 108 mmol/L (ref 98–111)
Creatinine, Ser: 0.47 mg/dL — ABNORMAL LOW (ref 0.61–1.24)
GFR, Estimated: >60 mL/min (ref >60)
Glucose, Bld: 160 mg/dL — ABNORMAL HIGH (ref 70–99)
Potassium: 3 mmol/L — ABNORMAL LOW (ref 3.5–5.1)
Sodium: 137 mmol/L (ref 135–145)

## 2020-12-25 LAB — CBC
HCT: 28.8 % — ABNORMAL LOW (ref 39.0–52.0)
Hemoglobin: 8.5 g/dL — ABNORMAL LOW (ref 13.0–17.0)
MCH: 23.4 pg — ABNORMAL LOW (ref 26.0–34.0)
MCHC: 29.5 g/dL — ABNORMAL LOW (ref 30.0–36.0)
MCV: 79.3 fL — ABNORMAL LOW (ref 80.0–100.0)
Platelets: 181 10*3/uL (ref 150–400)
RBC: 3.63 MIL/uL — ABNORMAL LOW (ref 4.22–5.81)
RDW: 19.4 % — ABNORMAL HIGH (ref 11.5–15.5)
WBC: 10.1 10*3/uL (ref 4.0–10.5)
nRBC: 0 % (ref 0.0–0.2)

## 2020-12-25 LAB — GLUCOSE, CAPILLARY
Glucose-Capillary: 136 mg/dL — ABNORMAL HIGH (ref 70–99)
Glucose-Capillary: 137 mg/dL — ABNORMAL HIGH (ref 70–99)
Glucose-Capillary: 147 mg/dL — ABNORMAL HIGH (ref 70–99)
Glucose-Capillary: 156 mg/dL — ABNORMAL HIGH (ref 70–99)
Glucose-Capillary: 161 mg/dL — ABNORMAL HIGH (ref 70–99)
Glucose-Capillary: 166 mg/dL — ABNORMAL HIGH (ref 70–99)
Glucose-Capillary: 222 mg/dL — ABNORMAL HIGH (ref 70–99)

## 2020-12-25 MED ORDER — POTASSIUM CHLORIDE CRYS ER 20 MEQ PO TBCR
40.0000 meq | EXTENDED_RELEASE_TABLET | Freq: Once | ORAL | Status: AC
  Administered 2020-12-25: 40 meq via ORAL
  Filled 2020-12-25: qty 2

## 2020-12-25 NOTE — Care Management Important Message (Signed)
Medicare IM printed for Social Work staff to give to the patient 

## 2020-12-25 NOTE — Progress Notes (Signed)
Asp/id auto consult for gram negative bactermia   75 yo male paraplegia from transverse myelitis, dysphagia s/p g-tube, dm2, stage iv sacral ulcer, recent uti with protes in setting indwelling foley, admitted 4/7 for n/v and decreased urinary/colostomy output found to have severe sepsis/septic shock and proteus bacteremia along with complicated uti/ucx of same   Improving on appropriate abx, which is now narrowed to ceftriaxone. Proteus resistant to bactrim  No sign of infection of chronic sacral ulcer  Reviewed vitals/labs/imaging/studies Afebrile here (tmax 100 for the admission)  Mild leukocytosis on admission 10.6  Lab Results  Component Value Date   WBC 10.1 12/25/2020   HGB 8.5 (L) 12/25/2020   HCT 28.8 (L) 12/25/2020   MCV 79.3 (L) 12/25/2020   PLT 181 12/25/2020   Lab Results  Component Value Date   NA 137 12/25/2020   K 3.0 (L) 12/25/2020   CO2 21 (L) 12/25/2020   GLUCOSE 160 (H) 12/25/2020   BUN 23 12/25/2020   CREATININE 0.47 (L) 12/25/2020   CALCIUM 7.6 (L) 12/25/2020   GFRNONAA >60 12/25/2020   GFRAA >60 06/10/2020      4/04 ct renal stone protocol No ureteral stones or hydronephrosis. Foley catheter is within the bladder which is decompressed.  Left lower quadrant ostomy noted.  No evidence of bowel obstruction.  Colonic diverticulosis.  Aortoiliac atherosclerosis.    A/p Proteus bsi with source likely complicated uti Chronic sacral ulcer not involved in sepsis at this time Severe sepsis resolved   -agree with narrowing of abx to ceftriaxone -if continued improvement and taking po well by 4/10, can transition to po cephalexin 500 mg qid or cefadroxil 1 gram twice daily to finish up total 10 day course of antibiotics on 4/18

## 2020-12-25 NOTE — TOC Progression Note (Signed)
Transition of Care (TOC) - Progression Note    Patient Details  Name: Ashar Lewinski. MRN: [id number] Date of Birth: 07-25-1946  Transition of Care [facility]) CM/SW Contact  Purcell Mouton, RN Phone Number: 12/25/2020, 12:30 PM  Clinical Narrative:     Spoke with Admission Coordinator who states that Pt may return [facility] on Saturday in the AM. Will need COVID test before coming.   Expected Discharge Plan: [place] ([place]) Barriers to Discharge: No Barriers Identified  Expected Discharge Plan and Services Expected Discharge Plan: [place] ([place])       Living arrangements for the past 2 months: [place]                                       Social Determinants of Health (SDOH) Interventions    Readmission Risk Interventions Readmission Risk Prevention Plan 06/08/2020  Transportation Screening Complete  Medication Review (RN Care Manager) Referral to Pharmacy  PCP or Specialist appointment within 3-5 days of discharge Not Complete  PCP/Specialist Appt Not Complete comments SNF resident  [facility] or [place] Complete  SW Recovery Care/Counseling Consult Complete  Palliative Care Screening Complete  Skilled Nursing Facility Complete  Some recent data might be hidden

## 2020-12-25 NOTE — TOC Progression Note (Signed)
Transition of Care (TOC) - Progression Note    Patient Details  Name: Brent [name]. MRN: [id number] Date of Birth: 14-Aug-1946  Transition of Care [facility]) CM/SW Contact  Purcell Mouton, RN Phone Number: 12/25/2020, 1:14 PM  Clinical Narrative:    Pt's did agreed to going back to [facility], however is asking not to go until Monday. [facility] will take pt back in AM.    Expected Discharge Plan: [place] ([place] Term) Barriers to Discharge: No Barriers Identified  Expected Discharge Plan and Services Expected Discharge Plan: [place] ([place])       Living arrangements for the past 2 months: [place]                                       Social Determinants of Health (SDOH) Interventions    Readmission Risk Interventions Readmission Risk Prevention Plan 06/08/2020  Transportation Screening Complete  Medication Review [employer]) Referral to Pharmacy  PCP or Specialist appointment within 3-5 days of discharge Not Complete  PCP/Specialist Appt Not Complete comments SNF resident  [facility] or [place] Complete  SW Recovery Care/Counseling Consult Complete  Palliative Care Screening Complete  Skilled Nursing Facility Complete  Some recent data might be hidden

## 2020-12-25 NOTE — Plan of Care (Signed)
  Problem: Education: Goal: Ability to describe self-care measures that may prevent or decrease complications (Diabetes Survival Skills Education) will improve Outcome: Progressing Goal: Individualized Educational Video(s) Outcome: Progressing   Problem: Metabolic: Goal: Ability to maintain appropriate glucose levels will improve Outcome: Progressing   Problem: Fluid Volume: Goal: Ability to maintain a balanced intake and output will improve Outcome: Progressing

## 2020-12-25 NOTE — Progress Notes (Signed)
PROGRESS NOTE    Brent [name].  PPI:[id number] DOB: Dec 19, 1945 DOA: 12/21/2020 PCP: [name], [name], MD   Brief Narrative:  Brent [name].is a 75 y.o.malewith medical history significant of paraplegia secondary to transverse myelitis, dysphagia status post G-tube placement, stage IV sacral pressure ulcer with left gluteal abscess and L4-L5 osteomyelitis and right psoas abscess in 2021 and followed by infectious disease, status post colostomy, CAD, insulin-dependent type 2 diabetes, hypertension, hyperlipidemia, history of malignant melanoma, obesity, OSA, hospital admission a month ago for UTI (culture grew Proteus mirabilis) and hemorrhagic cystitis in the setting of indwelling Foley catheter followed by urology. He was sent from SNF due to dry heaves, decreased ostomy and urine output, found to be tachycardic, hypotensive with leukocytosis, lactic acidosis, with purulent urine collection and ARF without obstruction on CT abd/pelvis. He was admitted for IVF and antibiotics which have been given with hemodynamic stability and improvement in renal failure  Assessment & Plan:   Principal Problem:   UTI (urinary tract infection) Active Problems:   AKI (acute kidney injury) ([place])   Severe sepsis (HCC)   Sacral decubitus ulcer   Nausea  Severe sepsis secondary to CAUTI with concurrent bacteremia, POA 2/2 Enterobacter/Proteus species -Patient admitted tachycardic hypotensive with leukocytosis lactic acidosis and indwelling Foley concerning for UTI.   -Cultures show Enterobacter and Proteus species in the blood likely originating from urine.   -Continue cefepime per sensitivities, follow repeat blood cultures to ensure clearance - will transition to PO at discharge.  AKI without history of CKD - Likely prerenal azotemia from poor oral intake/dehydration and hypotension in the setting of severe sepsis. CT renal stone study without findings to suggest obstructive  uropathy. -IV fluid hydration.  Continue to monitor renal function and urine output.  Avoid nephrotoxic agents/hold home lisinopril.  Stage IV sacral decubitus ulcer, POA - No obvious signs of infection on exam.  Patient is not endorsing any pain. -Wound care following  Nausea, resolved Likely due to UTI.  He is not vomiting.  Abdominal exam benign.  No elevation of LFTs.  CT without evidence of bowel obstruction. -Antiemetic as needed  Insulin-dependent type 2 diabetes, uncontrolled - A1c 8.2 on 11/16/2020. -Hold basal insulin given poor p.o. intake in the setting of nausea as above  -Continue to follow with sliding scale and hypoglycemic protocol.    Hyperlipidemia -Continue Lipitor and Vascepa  GERD -Continue PPI  Depression -Continue sertraline, trazodone  BPH -Continue Flomax  DVT prophylaxis: Subcutaneous heparin Code Status: Full code-discussed with the patient. Family Communication: No family available at this time.  Status is: Inpatient  Dispo: The patient is from: SNF              Anticipated d/c is to: Same              Anticipated d/c date is: 24-48 hours              Patient currently not medically stable for discharge, awaiting cultures to ensure safe disposition  Consultants:   None  Procedures:   None  Antimicrobials:  Ceftriaxone  Subjective: No acute issues or events overnight denies nausea vomiting diarrhea constipation headache fevers chills.  Objective: Vitals:   12/24/20 1057 12/24/20 1314 12/24/20 2033 12/25/20 0417  BP:  (!) 121/53 (!) 121/48 (!) 135/56  Pulse:  78 77 82  Resp:  14 18 18   Temp:  98.4 F (36.9 C) 98.7 F (37.1 C) 98.5 F (36.9 C)  TempSrc:  Oral Oral  SpO2:  98% 100% 97%  Weight: 90.7 kg     Height:        Intake/Output Summary (Last 24 hours) at 12/25/2020 0834 Last data filed at 12/25/2020 0649 Gross per 24 hour  Intake 3539.7 ml  Output 600 ml  Net 2939.7 ml   Filed Weights   12/24/20 1057   Weight: 90.7 kg    Examination:  General exam: Appears calm and comfortable  Respiratory system: Clear to auscultation. Respiratory effort normal. Cardiovascular system: S1 & S2 heard, RRR. No JVD, murmurs, rubs, gallops or clicks. No pedal edema. Gastrointestinal system: Abdomen is nondistended, soft and nontender. No organomegaly or masses felt. Normal bowel sounds heard. Central nervous system: Alert and oriented. No focal neurological deficits. Extremities: Symmetric 5 x 5 power. Skin: No rashes, lesions or ulcers  Data Reviewed: I have personally reviewed following labs and imaging studies  CBC: Recent Labs  Lab 12/21/20 1650 12/22/20 0457 12/23/20 0401 12/24/20 0356 12/25/20 0359  WBC 12.8* 10.6* 6.1 8.3 10.1  NEUTROABS 11.8*  --  5.4  --   --   HGB 9.2* 8.6* 7.6* 9.2* 8.5*  HCT 30.5* 28.7* 26.1* 31.3* 28.8*  MCV 78.8* 78.4* 80.8 80.7 79.3*  PLT 285 255 200 211 585   Basic Metabolic Panel: Recent Labs  Lab 12/21/20 1650 12/22/20 0457 12/23/20 0401 12/24/20 0356 12/25/20 0359  NA 135 138 136 140 137  K 4.3 3.8 3.2* 3.2* 3.0*  CL 99 106 105 107 108  CO2 23 20* 20* 23 21*  GLUCOSE 216* 183* 133* 168* 160*  BUN 60* 53* 53* 38* 23  CREATININE 3.69* 2.47* 1.29* 0.87 0.47*  CALCIUM 8.0* 7.6* 7.5* 8.1* 7.6*   GFR: Estimated Creatinine Clearance: 86.2 mL/min (A) (by C-G formula based on SCr of 0.47 mg/dL (L)). Liver Function Tests: Recent Labs  Lab 12/21/20 1650  AST 14*  ALT 12  ALKPHOS 89  BILITOT 0.7  PROT 6.0*  ALBUMIN 2.4*   No results for input(s): LIPASE, AMYLASE in the last 168 hours. No results for input(s): AMMONIA in the last 168 hours. Coagulation Profile: Recent Labs  Lab 12/21/20 1650  INR 1.2   Cardiac Enzymes: No results for input(s): CKTOTAL, CKMB, CKMBINDEX, TROPONINI in the last 168 hours. BNP (last 3 results) No results for input(s): PROBNP in the last 8760 hours. HbA1C: No results for input(s): HGBA1C in the last 72  hours. CBG: Recent Labs  Lab 12/24/20 1624 12/24/20 2030 12/25/20 0004 12/25/20 0415 12/25/20 0744  GLUCAP 175* 183* 166* 161* 137*   Lipid Profile: No results for input(s): CHOL, HDL, LDLCALC, TRIG, CHOLHDL, LDLDIRECT in the last 72 hours. Thyroid Function Tests: No results for input(s): TSH, T4TOTAL, FREET4, T3FREE, THYROIDAB in the last 72 hours. Anemia Panel: No results for input(s): VITAMINB12, FOLATE, FERRITIN, TIBC, IRON, RETICCTPCT in the last 72 hours. Sepsis Labs: Recent Labs  Lab 12/21/20 1640 12/21/20 1928 12/22/20 0457  LATICACIDVEN 2.2* 3.1* 1.1    Recent Results (from the past 240 hour(s))  Culture, blood (Routine x 2)     Status: Abnormal   Collection Time: 12/21/20  4:50 PM   Specimen: BLOOD  Result Value Ref Range Status   Specimen Description   Final    BLOOD LEFT ANTECUBITAL Performed at [facility], [place] [address], [place], [place] [address]    Special Requests   Final    BOTTLES DRAWN AEROBIC AND ANAEROBIC Blood Culture adequate volume Performed at [facility], [place] [address] [place], [place] [address]    Culture  Setup Time   Final    GRAM NEGATIVE RODS IN BOTH AEROBIC AND ANAEROBIC BOTTLES CRITICAL VALUE NOTED.  VALUE IS CONSISTENT WITH PREVIOUSLY REPORTED AND CALLED VALUE.    Culture (A)  Final    PROTEUS MIRABILIS SUSCEPTIBILITIES PERFORMED ON PREVIOUS CULTURE WITHIN THE LAST 5 DAYS. Performed at [facility], [place] [address] [place], [place] [address]    Report Status 12/24/2020 FINAL  Final  Culture, blood (Routine x 2)     Status: Abnormal   Collection Time: 12/21/20  4:54 PM   Specimen: BLOOD RIGHT HAND  Result Value Ref Range Status   Specimen Description   Final    BLOOD RIGHT HAND Performed at [place] [address] [place], [place] [address]    Special Requests   Final    BOTTLES DRAWN AEROBIC AND ANAEROBIC Blood Culture adequate volume Performed at  [place] [address] [place], [place] [address]    Culture  Setup Time   Final    GRAM NEGATIVE RODS IN BOTH AEROBIC AND ANAEROBIC BOTTLES CRITICAL RESULT CALLED TO, READ BACK BY AND VERIFIED WITH: PHARMD A. PHAM 1423 [id number] FCP Performed at [place] [address] [place], [place] [address]    Culture PROTEUS MIRABILIS (A)  Final   Report Status 12/24/2020 FINAL  Final   Organism ID, Bacteria PROTEUS MIRABILIS  Final      Susceptibility   Proteus mirabilis - MIC*    AMPICILLIN <=2 SENSITIVE Sensitive     CEFAZOLIN <=4 SENSITIVE Sensitive     CEFEPIME <=0.12 SENSITIVE Sensitive     CEFTAZIDIME <=1 SENSITIVE Sensitive     CEFTRIAXONE <=0.25 SENSITIVE Sensitive     CIPROFLOXACIN <=0.25 SENSITIVE Sensitive     GENTAMICIN <=1 SENSITIVE Sensitive     IMIPENEM 2 SENSITIVE Sensitive     TRIMETH/SULFA >=320 RESISTANT Resistant     AMPICILLIN/SULBACTAM <=2 SENSITIVE Sensitive     PIP/TAZO <=4 SENSITIVE Sensitive     * PROTEUS MIRABILIS  Blood Culture ID Panel (Reflexed)     Status: Abnormal   Collection Time: 12/21/20  4:54 PM  Result Value Ref Range Status   Enterococcus faecalis NOT DETECTED NOT DETECTED Final   Enterococcus Faecium NOT DETECTED NOT DETECTED Final   Listeria monocytogenes NOT DETECTED NOT DETECTED Final   Staphylococcus species NOT DETECTED NOT DETECTED Final   Staphylococcus aureus (BCID) NOT DETECTED NOT DETECTED Final   Staphylococcus epidermidis NOT DETECTED NOT DETECTED Final   Staphylococcus lugdunensis NOT DETECTED NOT DETECTED Final   Streptococcus species NOT DETECTED NOT DETECTED Final   Streptococcus agalactiae NOT DETECTED NOT DETECTED Final   Streptococcus pneumoniae NOT DETECTED NOT DETECTED Final   Streptococcus pyogenes NOT DETECTED NOT DETECTED Final   A.calcoaceticus-baumannii NOT DETECTED NOT DETECTED Final   Bacteroides fragilis NOT DETECTED NOT DETECTED Final   Enterobacterales DETECTED (A) NOT DETECTED  Final    Comment: Enterobacterales represent a large order of gram negative bacteria, not a single organism. CRITICAL RESULT CALLED TO, READ BACK BY AND VERIFIED WITH: PHARMD A. PHAM 1423 [id number] FCP    Enterobacter cloacae complex NOT DETECTED NOT DETECTED Final   Escherichia coli NOT DETECTED NOT DETECTED Final   Klebsiella aerogenes NOT DETECTED NOT DETECTED Final   Klebsiella oxytoca NOT DETECTED NOT DETECTED Final   Klebsiella pneumoniae NOT DETECTED NOT DETECTED Final   Proteus species DETECTED (A)  NOT DETECTED Final    Comment: CRITICAL RESULT CALLED TO, READ BACK BY AND VERIFIED WITH: PHARMD A. PHAM 1423 [id number] FCP    Salmonella species NOT DETECTED NOT DETECTED Final   Serratia marcescens NOT DETECTED NOT DETECTED Final   Haemophilus influenzae NOT DETECTED NOT DETECTED Final   Neisseria meningitidis NOT DETECTED NOT DETECTED Final   Pseudomonas aeruginosa NOT DETECTED NOT DETECTED Final   Stenotrophomonas maltophilia NOT DETECTED NOT DETECTED Final   Candida albicans NOT DETECTED NOT DETECTED Final   Candida auris NOT DETECTED NOT DETECTED Final   Candida glabrata NOT DETECTED NOT DETECTED Final   Candida krusei NOT DETECTED NOT DETECTED Final   Candida parapsilosis NOT DETECTED NOT DETECTED Final   Candida tropicalis NOT DETECTED NOT DETECTED Final   Cryptococcus neoformans/gattii NOT DETECTED NOT DETECTED Final   CTX-M ESBL NOT DETECTED NOT DETECTED Final   Carbapenem resistance IMP NOT DETECTED NOT DETECTED Final   Carbapenem resistance KPC NOT DETECTED NOT DETECTED Final   Carbapenem resistance NDM NOT DETECTED NOT DETECTED Final   Carbapenem resist OXA 48 LIKE NOT DETECTED NOT DETECTED Final   Carbapenem resistance VIM NOT DETECTED NOT DETECTED Final    Comment: Performed at [facility], [address], [place], [place] [address]  SARS CORONAVIRUS 2 (TAT 6-24 HRS) Nasopharyngeal Nasopharyngeal Swab     Status: None   Collection Time: 12/21/20  5:04 PM    Specimen: Nasopharyngeal Swab  Result Value Ref Range Status   SARS Coronavirus 2 NEGATIVE NEGATIVE Final    Comment: (NOTE) SARS-CoV-2 target nucleic acids are NOT DETECTED.  The SARS-CoV-2 RNA is generally detectable in upper and lower respiratory specimens during the acute phase of infection. Negative results do not preclude SARS-CoV-2 infection, do not rule out co-infections with other pathogens, and should not be used as the sole basis for treatment or other patient management decisions. Negative results must be combined with clinical observations, patient history, and epidemiological information. The expected result is Negative.  Fact Sheet for Patients: SugarRoll.be  Fact Sheet for Healthcare Providers: https://www.woods-mathews.com/  This test is not yet approved or cleared by the [place] FDA and  has been authorized for detection and/or diagnosis of SARS-CoV-2 by FDA under an Emergency Use Authorization (EUA). This EUA will remain  in effect (meaning this test can be used) for the duration of the COVID-19 declaration under Se ction 564(b)(1) of the Act, 21 U.S.C. section 360bbb-3(b)(1), unless the authorization is terminated or revoked sooner.  Performed at [facility], [place] [address], [place], [place] [address]   Culture, Urine     Status: Abnormal   Collection Time: 12/21/20  5:04 PM   Specimen: Urine, Clean Catch  Result Value Ref Range Status   Specimen Description   Final    URINE, CLEAN CATCH Performed at [facility], [place] [address], [place], [place] [address]    Special Requests   Final    NONE Performed at [facility], [place] [address] [place], [place] [address]    Culture >=100,000 COLONIES/mL PROTEUS MIRABILIS (A)  Final   Report Status 12/24/2020 FINAL  Final   Organism ID, Bacteria PROTEUS MIRABILIS (A)  Final      Susceptibility   Proteus mirabilis - MIC*     AMPICILLIN >=32 RESISTANT Resistant     CEFAZOLIN <=4 SENSITIVE Sensitive     CEFEPIME <=0.12 SENSITIVE Sensitive     CEFTRIAXONE <=0.25 SENSITIVE Sensitive  CIPROFLOXACIN INTERMEDIATE Intermediate     GENTAMICIN <=1 SENSITIVE Sensitive     IMIPENEM 4 SENSITIVE Sensitive     NITROFURANTOIN 128 RESISTANT Resistant     TRIMETH/SULFA >=320 RESISTANT Resistant     AMPICILLIN/SULBACTAM <=2 SENSITIVE Sensitive     PIP/TAZO <=4 SENSITIVE Sensitive     * >=100,000 COLONIES/mL PROTEUS MIRABILIS         Radiology Studies: No results found.      Scheduled Meds: . aspirin EC  81 mg Oral QHS  . atorvastatin  20 mg Oral Daily  . Chlorhexidine Gluconate Cloth  6 each Topical Daily  . collagenase   Topical Daily  . ferrous gluconate  324 mg Oral Daily  . fluticasone  2 spray Each Nare BID  . gabapentin  300 mg Oral QHS  . heparin  5,000 Units Subcutaneous Q8H  . icosapent Ethyl  2 g Oral BID  . insulin aspart  0-9 Units Subcutaneous Q4H  . pantoprazole  40 mg Oral Daily  . potassium chloride  40 mEq Oral Once  . senna-docusate  2 tablet Oral Daily  . sertraline  25 mg Oral Daily  . sertraline  50 mg Oral Daily  . tamsulosin  0.4 mg Oral QPC supper  . traZODone  50 mg Oral QHS   Continuous Infusions: . cefTRIAXone (ROCEPHIN)  IV 2 g (12/24/20 1831)  . lactated ringers 125 mL/hr at 12/25/20 0459     LOS: 4 days   Time spent: 63min  Caelan Atchley C Yarlin Breisch, DO [facility]  If 7PM-7AM, please contact night-coverage [email or web address]  12/25/2020, 8:34 AM

## 2020-12-26 DIAGNOSIS — R41841 Cognitive communication deficit: Secondary | ICD-10-CM | POA: Diagnosis not present

## 2020-12-26 DIAGNOSIS — T839XXA Unspecified complication of genitourinary prosthetic device, implant and graft, initial encounter: Secondary | ICD-10-CM | POA: Diagnosis not present

## 2020-12-26 DIAGNOSIS — F419 Anxiety disorder, unspecified: Secondary | ICD-10-CM | POA: Diagnosis not present

## 2020-12-26 DIAGNOSIS — B001 Herpesviral vesicular dermatitis: Secondary | ICD-10-CM | POA: Diagnosis not present

## 2020-12-26 DIAGNOSIS — R69 Illness, unspecified: Secondary | ICD-10-CM | POA: Diagnosis not present

## 2020-12-26 DIAGNOSIS — E1151 Type 2 diabetes mellitus with diabetic peripheral angiopathy without gangrene: Secondary | ICD-10-CM | POA: Diagnosis not present

## 2020-12-26 DIAGNOSIS — R030 Elevated blood-pressure reading, without diagnosis of hypertension: Secondary | ICD-10-CM | POA: Diagnosis not present

## 2020-12-26 DIAGNOSIS — L97509 Non-pressure chronic ulcer of other part of unspecified foot with unspecified severity: Secondary | ICD-10-CM | POA: Diagnosis not present

## 2020-12-26 DIAGNOSIS — R6520 Severe sepsis without septic shock: Secondary | ICD-10-CM | POA: Diagnosis not present

## 2020-12-26 DIAGNOSIS — E118 Type 2 diabetes mellitus with unspecified complications: Secondary | ICD-10-CM | POA: Diagnosis not present

## 2020-12-26 DIAGNOSIS — T783XXA Angioneurotic edema, initial encounter: Secondary | ICD-10-CM | POA: Diagnosis not present

## 2020-12-26 DIAGNOSIS — K121 Other forms of stomatitis: Secondary | ICD-10-CM | POA: Diagnosis not present

## 2020-12-26 DIAGNOSIS — Z955 Presence of coronary angioplasty implant and graft: Secondary | ICD-10-CM | POA: Diagnosis not present

## 2020-12-26 DIAGNOSIS — E119 Type 2 diabetes mellitus without complications: Secondary | ICD-10-CM | POA: Diagnosis not present

## 2020-12-26 DIAGNOSIS — X58XXXA Exposure to other specified factors, initial encounter: Secondary | ICD-10-CM | POA: Diagnosis not present

## 2020-12-26 DIAGNOSIS — N138 Other obstructive and reflux uropathy: Secondary | ICD-10-CM | POA: Diagnosis not present

## 2020-12-26 DIAGNOSIS — M2550 Pain in unspecified joint: Secondary | ICD-10-CM | POA: Diagnosis not present

## 2020-12-26 DIAGNOSIS — K9420 Gastrostomy complication, unspecified: Secondary | ICD-10-CM | POA: Diagnosis not present

## 2020-12-26 DIAGNOSIS — N319 Neuromuscular dysfunction of bladder, unspecified: Secondary | ICD-10-CM | POA: Diagnosis not present

## 2020-12-26 DIAGNOSIS — F331 Major depressive disorder, recurrent, moderate: Secondary | ICD-10-CM | POA: Diagnosis not present

## 2020-12-26 DIAGNOSIS — Z931 Gastrostomy status: Secondary | ICD-10-CM | POA: Diagnosis not present

## 2020-12-26 DIAGNOSIS — Z85820 Personal history of malignant melanoma of skin: Secondary | ICD-10-CM | POA: Diagnosis not present

## 2020-12-26 DIAGNOSIS — E785 Hyperlipidemia, unspecified: Secondary | ICD-10-CM | POA: Diagnosis not present

## 2020-12-26 DIAGNOSIS — E538 Deficiency of other specified B group vitamins: Secondary | ICD-10-CM | POA: Diagnosis not present

## 2020-12-26 DIAGNOSIS — R634 Abnormal weight loss: Secondary | ICD-10-CM | POA: Diagnosis not present

## 2020-12-26 DIAGNOSIS — R532 Functional quadriplegia: Secondary | ICD-10-CM | POA: Diagnosis not present

## 2020-12-26 DIAGNOSIS — M6281 Muscle weakness (generalized): Secondary | ICD-10-CM | POA: Diagnosis not present

## 2020-12-26 DIAGNOSIS — N3001 Acute cystitis with hematuria: Secondary | ICD-10-CM | POA: Diagnosis not present

## 2020-12-26 DIAGNOSIS — G373 Acute transverse myelitis in demyelinating disease of central nervous system: Secondary | ICD-10-CM | POA: Diagnosis not present

## 2020-12-26 DIAGNOSIS — I10 Essential (primary) hypertension: Secondary | ICD-10-CM | POA: Diagnosis not present

## 2020-12-26 DIAGNOSIS — K592 Neurogenic bowel, not elsewhere classified: Secondary | ICD-10-CM | POA: Diagnosis not present

## 2020-12-26 DIAGNOSIS — D520 Dietary folate deficiency anemia: Secondary | ICD-10-CM | POA: Diagnosis not present

## 2020-12-26 DIAGNOSIS — M869 Osteomyelitis, unspecified: Secondary | ICD-10-CM | POA: Diagnosis not present

## 2020-12-26 DIAGNOSIS — R194 Change in bowel habit: Secondary | ICD-10-CM | POA: Diagnosis not present

## 2020-12-26 DIAGNOSIS — L89623 Pressure ulcer of left heel, stage 3: Secondary | ICD-10-CM | POA: Diagnosis not present

## 2020-12-26 DIAGNOSIS — G360 Neuromyelitis optica [Devic]: Secondary | ICD-10-CM | POA: Diagnosis not present

## 2020-12-26 DIAGNOSIS — Z13228 Encounter for screening for other metabolic disorders: Secondary | ICD-10-CM | POA: Diagnosis not present

## 2020-12-26 DIAGNOSIS — L89319 Pressure ulcer of right buttock, unspecified stage: Secondary | ICD-10-CM | POA: Diagnosis not present

## 2020-12-26 DIAGNOSIS — K9429 Other complications of gastrostomy: Secondary | ICD-10-CM | POA: Diagnosis not present

## 2020-12-26 DIAGNOSIS — Z7409 Other reduced mobility: Secondary | ICD-10-CM | POA: Diagnosis not present

## 2020-12-26 DIAGNOSIS — N390 Urinary tract infection, site not specified: Secondary | ICD-10-CM | POA: Diagnosis not present

## 2020-12-26 DIAGNOSIS — F3289 Other specified depressive episodes: Secondary | ICD-10-CM | POA: Diagnosis not present

## 2020-12-26 DIAGNOSIS — L89510 Pressure ulcer of right ankle, unstageable: Secondary | ICD-10-CM | POA: Diagnosis not present

## 2020-12-26 DIAGNOSIS — R278 Other lack of coordination: Secondary | ICD-10-CM | POA: Diagnosis not present

## 2020-12-26 DIAGNOSIS — Z431 Encounter for attention to gastrostomy: Secondary | ICD-10-CM | POA: Diagnosis not present

## 2020-12-26 DIAGNOSIS — Z7401 Bed confinement status: Secondary | ICD-10-CM | POA: Diagnosis not present

## 2020-12-26 DIAGNOSIS — R1084 Generalized abdominal pain: Secondary | ICD-10-CM | POA: Diagnosis not present

## 2020-12-26 DIAGNOSIS — R531 Weakness: Secondary | ICD-10-CM | POA: Diagnosis not present

## 2020-12-26 DIAGNOSIS — I708 Atherosclerosis of other arteries: Secondary | ICD-10-CM | POA: Diagnosis not present

## 2020-12-26 DIAGNOSIS — Z79899 Other long term (current) drug therapy: Secondary | ICD-10-CM | POA: Diagnosis not present

## 2020-12-26 DIAGNOSIS — G8220 Paraplegia, unspecified: Secondary | ICD-10-CM | POA: Diagnosis not present

## 2020-12-26 DIAGNOSIS — A4189 Other specified sepsis: Secondary | ICD-10-CM | POA: Diagnosis not present

## 2020-12-26 DIAGNOSIS — D6489 Other specified anemias: Secondary | ICD-10-CM | POA: Diagnosis not present

## 2020-12-26 DIAGNOSIS — R42 Dizziness and giddiness: Secondary | ICD-10-CM | POA: Diagnosis not present

## 2020-12-26 DIAGNOSIS — L89210 Pressure ulcer of right hip, unstageable: Secondary | ICD-10-CM | POA: Diagnosis not present

## 2020-12-26 DIAGNOSIS — E1169 Type 2 diabetes mellitus with other specified complication: Secondary | ICD-10-CM | POA: Diagnosis not present

## 2020-12-26 DIAGNOSIS — R631 Polydipsia: Secondary | ICD-10-CM | POA: Diagnosis not present

## 2020-12-26 DIAGNOSIS — G8250 Quadriplegia, unspecified: Secondary | ICD-10-CM | POA: Diagnosis not present

## 2020-12-26 DIAGNOSIS — L89214 Pressure ulcer of right hip, stage 4: Secondary | ICD-10-CM | POA: Diagnosis not present

## 2020-12-26 DIAGNOSIS — E782 Mixed hyperlipidemia: Secondary | ICD-10-CM | POA: Diagnosis not present

## 2020-12-26 DIAGNOSIS — T07XXXA Unspecified multiple injuries, initial encounter: Secondary | ICD-10-CM | POA: Diagnosis not present

## 2020-12-26 DIAGNOSIS — Z743 Need for continuous supervision: Secondary | ICD-10-CM | POA: Diagnosis not present

## 2020-12-26 DIAGNOSIS — B964 Proteus (mirabilis) (morganii) as the cause of diseases classified elsewhere: Secondary | ICD-10-CM | POA: Diagnosis not present

## 2020-12-26 DIAGNOSIS — L89613 Pressure ulcer of right heel, stage 3: Secondary | ICD-10-CM | POA: Diagnosis not present

## 2020-12-26 DIAGNOSIS — N318 Other neuromuscular dysfunction of bladder: Secondary | ICD-10-CM | POA: Diagnosis not present

## 2020-12-26 DIAGNOSIS — L089 Local infection of the skin and subcutaneous tissue, unspecified: Secondary | ICD-10-CM | POA: Diagnosis not present

## 2020-12-26 DIAGNOSIS — K219 Gastro-esophageal reflux disease without esophagitis: Secondary | ICD-10-CM | POA: Diagnosis not present

## 2020-12-26 DIAGNOSIS — Z880 Allergy status to penicillin: Secondary | ICD-10-CM | POA: Diagnosis not present

## 2020-12-26 DIAGNOSIS — R5381 Other malaise: Secondary | ICD-10-CM | POA: Diagnosis not present

## 2020-12-26 DIAGNOSIS — Z978 Presence of other specified devices: Secondary | ICD-10-CM | POA: Diagnosis not present

## 2020-12-26 DIAGNOSIS — A419 Sepsis, unspecified organism: Secondary | ICD-10-CM | POA: Diagnosis not present

## 2020-12-26 DIAGNOSIS — I152 Hypertension secondary to endocrine disorders: Secondary | ICD-10-CM | POA: Diagnosis not present

## 2020-12-26 DIAGNOSIS — E1142 Type 2 diabetes mellitus with diabetic polyneuropathy: Secondary | ICD-10-CM | POA: Diagnosis not present

## 2020-12-26 DIAGNOSIS — Z452 Encounter for adjustment and management of vascular access device: Secondary | ICD-10-CM | POA: Diagnosis not present

## 2020-12-26 DIAGNOSIS — G4733 Obstructive sleep apnea (adult) (pediatric): Secondary | ICD-10-CM | POA: Diagnosis not present

## 2020-12-26 DIAGNOSIS — Z9189 Other specified personal risk factors, not elsewhere classified: Secondary | ICD-10-CM | POA: Diagnosis not present

## 2020-12-26 DIAGNOSIS — I2510 Atherosclerotic heart disease of native coronary artery without angina pectoris: Secondary | ICD-10-CM | POA: Diagnosis not present

## 2020-12-26 DIAGNOSIS — Z794 Long term (current) use of insulin: Secondary | ICD-10-CM | POA: Diagnosis not present

## 2020-12-26 DIAGNOSIS — K5900 Constipation, unspecified: Secondary | ICD-10-CM | POA: Diagnosis not present

## 2020-12-26 DIAGNOSIS — E1140 Type 2 diabetes mellitus with diabetic neuropathy, unspecified: Secondary | ICD-10-CM | POA: Diagnosis not present

## 2020-12-26 DIAGNOSIS — I959 Hypotension, unspecified: Secondary | ICD-10-CM | POA: Diagnosis not present

## 2020-12-26 DIAGNOSIS — E876 Hypokalemia: Secondary | ICD-10-CM | POA: Diagnosis not present

## 2020-12-26 DIAGNOSIS — E1165 Type 2 diabetes mellitus with hyperglycemia: Secondary | ICD-10-CM | POA: Diagnosis not present

## 2020-12-26 DIAGNOSIS — R1312 Dysphagia, oropharyngeal phase: Secondary | ICD-10-CM | POA: Diagnosis not present

## 2020-12-26 DIAGNOSIS — R630 Anorexia: Secondary | ICD-10-CM | POA: Diagnosis not present

## 2020-12-26 DIAGNOSIS — F411 Generalized anxiety disorder: Secondary | ICD-10-CM | POA: Diagnosis not present

## 2020-12-26 DIAGNOSIS — Z7984 Long term (current) use of oral hypoglycemic drugs: Secondary | ICD-10-CM | POA: Diagnosis not present

## 2020-12-26 DIAGNOSIS — E7800 Pure hypercholesterolemia, unspecified: Secondary | ICD-10-CM | POA: Diagnosis not present

## 2020-12-26 DIAGNOSIS — T182XXA Foreign body in stomach, initial encounter: Secondary | ICD-10-CM | POA: Diagnosis not present

## 2020-12-26 DIAGNOSIS — I700 Atherosclerosis of aorta: Secondary | ICD-10-CM | POA: Diagnosis not present

## 2020-12-26 DIAGNOSIS — K9423 Gastrostomy malfunction: Secondary | ICD-10-CM | POA: Diagnosis not present

## 2020-12-26 DIAGNOSIS — Z85038 Personal history of other malignant neoplasm of large intestine: Secondary | ICD-10-CM | POA: Diagnosis not present

## 2020-12-26 DIAGNOSIS — E875 Hyperkalemia: Secondary | ICD-10-CM | POA: Diagnosis not present

## 2020-12-26 DIAGNOSIS — N401 Enlarged prostate with lower urinary tract symptoms: Secondary | ICD-10-CM | POA: Diagnosis not present

## 2020-12-26 DIAGNOSIS — R5383 Other fatigue: Secondary | ICD-10-CM | POA: Diagnosis not present

## 2020-12-26 DIAGNOSIS — S3991XA Unspecified injury of abdomen, initial encounter: Secondary | ICD-10-CM | POA: Diagnosis not present

## 2020-12-26 DIAGNOSIS — Z87891 Personal history of nicotine dependence: Secondary | ICD-10-CM | POA: Diagnosis not present

## 2020-12-26 DIAGNOSIS — E43 Unspecified severe protein-calorie malnutrition: Secondary | ICD-10-CM | POA: Diagnosis not present

## 2020-12-26 DIAGNOSIS — E1159 Type 2 diabetes mellitus with other circulatory complications: Secondary | ICD-10-CM | POA: Diagnosis not present

## 2020-12-26 DIAGNOSIS — Z85828 Personal history of other malignant neoplasm of skin: Secondary | ICD-10-CM | POA: Diagnosis not present

## 2020-12-26 DIAGNOSIS — F5101 Primary insomnia: Secondary | ICD-10-CM | POA: Diagnosis not present

## 2020-12-26 DIAGNOSIS — L89154 Pressure ulcer of sacral region, stage 4: Secondary | ICD-10-CM | POA: Diagnosis not present

## 2020-12-26 DIAGNOSIS — R443 Hallucinations, unspecified: Secondary | ICD-10-CM | POA: Diagnosis not present

## 2020-12-26 DIAGNOSIS — R442 Other hallucinations: Secondary | ICD-10-CM | POA: Diagnosis not present

## 2020-12-26 DIAGNOSIS — Z4682 Encounter for fitting and adjustment of non-vascular catheter: Secondary | ICD-10-CM | POA: Diagnosis not present

## 2020-12-26 DIAGNOSIS — Z933 Colostomy status: Secondary | ICD-10-CM | POA: Diagnosis not present

## 2020-12-26 DIAGNOSIS — L89890 Pressure ulcer of other site, unstageable: Secondary | ICD-10-CM | POA: Diagnosis not present

## 2020-12-26 DIAGNOSIS — R110 Nausea: Secondary | ICD-10-CM | POA: Diagnosis not present

## 2020-12-26 DIAGNOSIS — N179 Acute kidney failure, unspecified: Secondary | ICD-10-CM | POA: Diagnosis not present

## 2020-12-26 DIAGNOSIS — D649 Anemia, unspecified: Secondary | ICD-10-CM | POA: Diagnosis not present

## 2020-12-26 DIAGNOSIS — R2681 Unsteadiness on feet: Secondary | ICD-10-CM | POA: Diagnosis not present

## 2020-12-26 DIAGNOSIS — Z7982 Long term (current) use of aspirin: Secondary | ICD-10-CM | POA: Diagnosis not present

## 2020-12-26 DIAGNOSIS — R682 Dry mouth, unspecified: Secondary | ICD-10-CM | POA: Diagnosis not present

## 2020-12-26 DIAGNOSIS — L89620 Pressure ulcer of left heel, unstageable: Secondary | ICD-10-CM | POA: Diagnosis not present

## 2020-12-26 DIAGNOSIS — L89304 Pressure ulcer of unspecified buttock, stage 4: Secondary | ICD-10-CM | POA: Diagnosis not present

## 2020-12-26 DIAGNOSIS — M6259 Muscle wasting and atrophy, not elsewhere classified, multiple sites: Secondary | ICD-10-CM | POA: Diagnosis not present

## 2020-12-26 LAB — CBC
HCT: 29.6 % — ABNORMAL LOW (ref 39.0–52.0)
Hemoglobin: 8.6 g/dL — ABNORMAL LOW (ref 13.0–17.0)
MCH: 23.3 pg — ABNORMAL LOW (ref 26.0–34.0)
MCHC: 29.1 g/dL — ABNORMAL LOW (ref 30.0–36.0)
MCV: 80.2 fL (ref 80.0–100.0)
Platelets: 221 10*3/uL (ref 150–400)
RBC: 3.69 MIL/uL — ABNORMAL LOW (ref 4.22–5.81)
RDW: 19.5 % — ABNORMAL HIGH (ref 11.5–15.5)
WBC: 13.1 10*3/uL — ABNORMAL HIGH (ref 4.0–10.5)
nRBC: 0 % (ref 0.0–0.2)

## 2020-12-26 LAB — BASIC METABOLIC PANEL
Anion gap: 9 (ref 5–15)
BUN: 16 mg/dL (ref 8–23)
CO2: 23 mmol/L (ref 22–32)
Calcium: 7.6 mg/dL — ABNORMAL LOW (ref 8.9–10.3)
Chloride: 107 mmol/L (ref 98–111)
Creatinine, Ser: 0.57 mg/dL — ABNORMAL LOW (ref 0.61–1.24)
GFR, Estimated: >60 mL/min (ref >60)
Glucose, Bld: 168 mg/dL — ABNORMAL HIGH (ref 70–99)
Potassium: 3.1 mmol/L — ABNORMAL LOW (ref 3.5–5.1)
Sodium: 139 mmol/L (ref 135–145)

## 2020-12-26 LAB — SARS CORONAVIRUS 2 (TAT 6-24 HRS): SARS Coronavirus 2: NEGATIVE

## 2020-12-26 LAB — GLUCOSE, CAPILLARY
Glucose-Capillary: 149 mg/dL — ABNORMAL HIGH (ref 70–99)
Glucose-Capillary: 173 mg/dL — ABNORMAL HIGH (ref 70–99)
Glucose-Capillary: 193 mg/dL — ABNORMAL HIGH (ref 70–99)

## 2020-12-26 MED ORDER — PROSOURCE TF PO LIQD: 45.0000 mL | Freq: Every day | ORAL | 0 refills | Status: AC

## 2020-12-26 MED ORDER — FREE WATER: 150.0000 mL | Freq: Four times a day (QID) | 0 refills | Status: AC

## 2020-12-26 MED ORDER — AMOXICILLIN-POT CLAVULANATE 875-125 MG PO TABS: 1.0000 | ORAL_TABLET | Freq: Two times a day (BID) | ORAL | 0 refills | Status: AC

## 2020-12-26 MED ORDER — JUVEN PO PACK: 1.0000 | PACK | Freq: Two times a day (BID) | ORAL | 0 refills | Status: AC

## 2020-12-26 NOTE — Progress Notes (Signed)
Report called to [place] [facility]. Patient already en route.

## 2020-12-26 NOTE — TOC Transition Note (Signed)
Transition of Care [facility]) - CM/SW Discharge Note   Patient Details  Name: Brent [name]. MRN: [id number] Date of Birth: Jan 18, 1946  Transition of Care [facility]: [place] Rhonda W) CM/SW Contact:  Trish Mage, LCSW Phone Number: 12/26/2020, 10:15 AM   Clinical Narrative:   Patient who is stable for d/c will return to [facility] today.  PTAR arranged.  Nursing, please call report to  [phone], room 407P.  TOC sign off.   Final next level of care: Skilled Nursing Facility Barriers to Discharge: No Barriers Identified   Patient Goals and CMS Choice Patient states their goals for this hospitalization and ongoing recovery are:: To get better   Choice offered to / list presented to : [facility]  Discharge Placement                       Discharge Plan and Services                                     Social Determinants of Health (SDOH) Interventions     Readmission Risk Interventions Readmission Risk Prevention Plan 06/08/2020  Transportation Screening Complete  Medication Review (RN Care Manager) Referral to Pharmacy  PCP or Specialist appointment within 3-5 days of discharge Not Complete  PCP/Specialist Appt Not Complete comments SNF resident  [facility] or [place] Complete  SW Recovery Care/Counseling Consult Complete  Palliative Care Screening Complete  Skilled Nursing Facility Complete  Some recent data might be hidden

## 2020-12-26 NOTE — NC FL2 (Signed)
[place] LEVEL OF CARE SCREENING TOOL     IDENTIFICATION  Patient Name: Brent [name]. Birthdate: [birth year] Sex: male Admission Date (Current Location): 12/21/2020  [facility] and [place] Number:  Herbalist and Address:  [facility],  [place], [place]      Provider Number: [id number]  Attending Physician Name and Address:  Little Ishikawa, MD  Relative Name and Phone Number:  Makel, Mcmann (Spouse)   [phone]    Current Level of Care: Hospital Recommended Level of Care: [place] Prior Approval Number:    Date Approved/Denied:   PASRR Number:    Discharge Plan: SNF ([place])    Current Diagnoses: Patient Active Problem List   Diagnosis Date Noted  . Severe sepsis ([place]) 12/22/2020  . Sacral decubitus ulcer 12/22/2020  . Nausea 12/22/2020  . UTI (urinary tract infection) 11/24/2020  . Hemorrhagic cystitis   . Encounter for monitoring immunomodulating therapy 08/20/2020  . High risk medication use 08/20/2020  . Psoas abscess, right ([place]) 06/05/2020  . Gram-negative bacteremia 06/05/2020  . Malnutrition of moderate degree 06/04/2020  . Lumbar discitis 06/02/2020  . Palliative care by specialist   . Goals of care, counseling/discussion   . FTT (failure to thrive) in adult 06/01/2020  . Abdominal pain   . Sacral ulcer, with necrosis of muscle ([place]) 04/09/2020  . 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 Transverse myelitis ([place])   . Labile blood pressure   . Labile blood glucose   . Neurogenic bladder   . Neurogenic bowel   . Acute blood loss anemia   . Transaminitis   . Controlled type 2 diabetes mellitus with hyperglycemia, with long-term current use of insulin ([place])   . Dyslipidemia   . HCAP (healthcare-associated pneumonia)   . Paraplegia ([place])   . Coronary artery disease involving native coronary artery of native heart without angina pectoris   . Steroid-induced hyperglycemia   .  Diabetic peripheral neuropathy ([place])   . Hyponatremia   . AKI (acute kidney injury) ([place])   . Weakness 03/09/2020  . Chest pain 03/08/2020  . Right leg numbness   . Malignant melanoma of left side of neck ([place]) 10/25/2018  . Facial neuritis 10/25/2018  . Myofascial pain syndrome 10/25/2018  . Occipital neuralgia of left side 10/25/2018  . CAP (community acquired pneumonia) 09/08/2017  . Male hypogonadism 06/20/2016  . Renal lesion 06/20/2016  . Insomnia 08/07/2015  . Enlarged prostate with lower urinary tract symptoms (LUTS) 11/07/2014  . Balanitis 07/30/2012  . Bladder neck obstruction 07/30/2012  . Prostate nodule 06/18/2012  . Recurrent nephrolithiasis 06/18/2012  . Benign neoplasm of colon 08/29/2011  . DEPRESSION, SITUATIONAL, ACUTE 01/23/2010  . Essential hypertension 05/30/2009  . Coronary atherosclerosis 05/30/2009  . Obstructive sleep apnea 11/28/2008  . OBESITY 09/23/2008  . ERECTILE DYSFUNCTION 11/26/2007  . Well controlled type 2 diabetes mellitus with peripheral circulatory disorder ([place]) 05/09/2007  . Hyperlipidemia 05/09/2007  . MYOCARDIAL INFARCTION, HX OF 05/09/2007  . DIVERTICULOSIS, COLON 05/09/2007    Orientation RESPIRATION BLADDER Height & Weight     Self,Time,Situation,Place  Normal Indwelling catheter Weight: 90.7 kg Height:  6' (182.9 cm)  BEHAVIORAL SYMPTOMS/MOOD NEUROLOGICAL BOWEL NUTRITION STATUS   (none)  (none) Colostomy Diet (see d/c summary)  AMBULATORY STATUS COMMUNICATION OF NEEDS Skin   Total Care Verbally Other (Comment) (Pressure injuries: Sacrum-upper and medial, L heel, Bi-lateral ankles, R Ischial tuberosity, L Foot anterior, Bi-lateral Legs Posterior)  Personal Care Assistance Level of Assistance  Bathing,Feeding,Dressing Bathing Assistance: Maximum assistance Feeding assistance: Independent Dressing Assistance: Maximum assistance     Functional Limitations Info  Sight,Hearing,Speech Sight Info:  Adequate Hearing Info: Adequate Speech Info: Adequate    SPECIAL CARE FACTORS FREQUENCY                       Contractures Contractures Info: Not present    Additional Factors Info  Code Status,Allergies Code Status Info: Full Allergies Info: Levofloxacin, Penicillins           Current Medications (12/26/2020):  This is the current hospital active medication list Current Facility-Administered Medications  Medication Dose Route Frequency Provider Last Rate Last Admin  . acetaminophen (TYLENOL) tablet 650 mg  650 mg Oral Q6H PRN Shela Leff, MD   650 mg at 12/23/20 0827   Or  . acetaminophen (TYLENOL) suppository 650 mg  650 mg Rectal Q6H PRN Shela Leff, MD      . aspirin EC tablet 81 mg  81 mg Oral QHS Shela Leff, MD   81 mg at 12/25/20 2158  . atorvastatin (LIPITOR) tablet 20 mg  20 mg Oral Daily Shela Leff, MD   20 mg at 12/26/20 0942  . calcium carbonate (TUMS - dosed in mg elemental calcium) chewable tablet 200 mg of elemental calcium  1 tablet Oral TID WC PRN Little Ishikawa, MD   200 mg of elemental calcium at 12/24/20 1320  . cefTRIAXone (ROCEPHIN) 2 g in sodium chloride 0.9 % 100 mL IVPB  2 g Intravenous Q24H Patrecia Pour, MD   Stopped at 12/25/20 1737  . Chlorhexidine Gluconate Cloth 2 % PADS 6 each  6 each Topical Daily Shela Leff, MD   6 each at 12/26/20 [phone]  . collagenase (SANTYL) ointment   Topical Daily Patrecia Pour, MD   Given at 12/25/20 [phone]  . ferrous gluconate (FERGON) tablet 324 mg  324 mg Oral Daily Shela Leff, MD   324 mg at 12/26/20 0938  . fluticasone (FLONASE) 50 MCG/ACT nasal spray 2 spray  2 spray Each Nare BID Shela Leff, MD   2 spray at 12/26/20 0945  . gabapentin (NEURONTIN) capsule 300 mg  300 mg Oral QHS Shela Leff, MD   300 mg at 12/25/20 2158  . heparin injection 5,000 Units  5,000 Units Subcutaneous Q8H Shela Leff, MD   5,000 Units at 12/26/20 0518  . icosapent  Ethyl (VASCEPA) 1 g capsule 2 g  2 g Oral BID Shela Leff, MD   2 g at 12/22/20 1250  . insulin aspart (novoLOG) injection 0-9 Units  0-9 Units Subcutaneous Q4H Shela Leff, MD   2 Units at 12/26/20 [phone]  . lactated ringers infusion   Intravenous Continuous Patrecia Pour, MD 125 mL/hr at 12/26/20 0337 New Bag at 12/26/20 5732  . ondansetron (ZOFRAN) injection 4 mg  4 mg Intravenous Q6H PRN Shela Leff, MD   4 mg at 12/24/20 0815  . pantoprazole (PROTONIX) EC tablet 40 mg  40 mg Oral Daily Shela Leff, MD   40 mg at 12/26/20 0933  . senna-docusate (Senokot-S) tablet 2 tablet  2 tablet Oral Daily Shela Leff, MD   2 tablet at 12/26/20 [phone]  . sertraline (ZOLOFT) tablet 25 mg  25 mg Oral Daily Shela Leff, MD   25 mg at 12/26/20 0941  . sertraline (ZOLOFT) tablet 50 mg  50 mg Oral Daily Shela Leff, MD   50 mg at  12/26/20 0941  . tamsulosin (FLOMAX) capsule 0.4 mg  0.4 mg Oral QPC supper Shela Leff, MD   0.4 mg at 12/25/20 1636  . traZODone (DESYREL) tablet 50 mg  50 mg Oral QHS Shela Leff, MD   50 mg at 12/25/20 2158     Discharge Medications: Please see discharge summary for a list of discharge medications.  Relevant Imaging Results:  Relevant Lab Results:   Additional Information SS#: [place]  [place], [place]

## 2020-12-26 NOTE — Discharge Summary (Signed)
Physician Discharge Summary  Brent [name]. WUJ:[id number] DOB: Oct 25, 1945 DOA: 12/21/2020  PCP: [name], [name], MD  Admit date: 12/21/2020 Discharge date: 12/26/2020  Admitted From: SNF Disposition:  SNF  Recommendations for Outpatient Follow-up:  1. Follow up with PCP in 1-2 weeks 2. Please obtain BMP/CBC in one week 3. Please follow up with PCP sooner if fevers  Discharge Condition:Stable  CODE STATUS: Full Diet recommendation: Low-carb diabetic diet as tolerated  Brief/Interim Summary: Brent Evansis a 75 y.o.malewith medical history significant of paraplegia secondary to transverse myelitis, dysphagia status post G-tube placement, stage IV sacral pressure ulcer with left gluteal abscess and L4-L5 osteomyelitis and right psoas abscess in 2021 and followed by infectious disease, status post colostomy, CAD, insulin-dependent type 2 diabetes, hypertension, hyperlipidemia, history of malignant melanoma, obesity, OSA, hospital admission a month ago for UTI (culture grew Proteus mirabilis) and hemorrhagic cystitis in the setting of indwelling Foley catheter followed by urology.He was sent from SNF due to dry heaves, decreased ostomy and urine output, found to be tachycardic, hypotensive with leukocytosis, lactic acidosis, with purulent urine collection and ARF without obstruction on CT abd/pelvis. He was admitted for IVF and antibiotics which have been given with hemodynamic stability and improvement in renal failure.  Patient admitted as above with severe sepsis secondary to likely UTI secondary to indwelling catheter with notable concurrent bacteremia, cultures remarkable for Enterobacter species as well as Proteus species.  Per discussion with ID will de-escalate patient from IV antibiotics to p.o. antibiotics, only notable resistance to Bactrim on cultures and sensitivities.  Patient also had moderate AKI without history of CKD in the setting of azotemia poor p.o. intake  in the setting of above.  Patient's stage IV sacral decubitus ulcer (present on admission) remains stable does not appear to be acutely infected and is not likely source for patient's bacteremia.  Patient otherwise stable and agreeable for discharge back to skilled nursing facility where he currently resides for ongoing physical therapy and treatment.  Suggest close follow-up in the next 1 to 2 weeks for repeat labs as well as surveillance given patient's bacteremia with gram-negative species.  Will discharge on an additional 9 days of Augmentin to complete 14-day total course.  No other medication changes during hospital stay  Discharge Diagnoses:  Principal Problem:   UTI (urinary tract infection) Active Problems:   AKI (acute kidney injury) ([facility])   Severe sepsis ([facility])   Sacral decubitus ulcer   Nausea    Discharge Instructions  Discharge Instructions    Call MD for:  severe uncontrolled pain   Complete by: As directed    Call MD for:  temperature >100.4   Complete by: As directed    Diet - low sodium heart healthy   Complete by: As directed    Discharge wound care:   Complete by: As directed    Wound care  Daily      Comments: Apply alginate Kellie Simmering # [phone]) to sacrum (2 wounds) Q day, then cover with foam dressing.  (Change foam dressing Q 3 days or PRN soiling. Moisten previous dressing with NS each time to remove   Foam dressing  Until discontinued      Comments: Foam dressings to bilat heels, feet ankles, change Q 3 days or PRN soiling   Increase activity slowly   Complete by: As directed      Allergies as of 12/26/2020      Reactions   Levofloxacin Rash   Penicillins Hives,  Rash   Tolerated Unasyn, cefepime and cephalexin      Medication List    TAKE these medications   acetaminophen 325 MG tablet Commonly known as: TYLENOL Take 2 tablets (650 mg total) by mouth every 6 (six) hours as needed for headache.   albuterol (2.5 MG/3ML) 0.083% nebulizer  solution Commonly known as: PROVENTIL Take 3 mLs (2.5 mg total) by nebulization once as needed for wheezing or shortness of breath (or anaphylaxis due to Rituxan infusion). What changed: when to take this   amoxicillin-clavulanate 875-125 MG tablet Commonly known as: Augmentin Take 1 tablet by mouth 2 (two) times daily for 9 days.   antiseptic oral rinse Liqd 15 mLs by Mouth Rinse route 2 (two) times daily. Swish & Spit   aspirin EC 81 MG tablet Take 81 mg by mouth at bedtime. Swallow whole.   atorvastatin 20 MG tablet Commonly known as: LIPITOR Take 1 tablet (20 mg total) by mouth daily.   Biofreeze 4 % Gel Generic drug: Menthol (Topical Analgesic) Apply 1 application topically in the morning, at noon, and at bedtime.   Icy Hot Advanced Relief 7.5 % Ptch Generic drug: Menthol Apply 1 patch topically daily.   collagenase ointment Commonly known as: SANTYL Apply topically daily. What changed: how much to take   [employer] Peroxide Sore Mouth Cleans 1.5 % Soln Generic drug: hydrogen peroxide Apply 10 mLs topically 2 times daily at 12 noon and 4 pm. 10 mL, mucous membrane, twice a day, rinse and spit   feeding supplement (PROSource TF) liquid Place 45 mLs into feeding tube daily.   nutrition supplement (JUVEN) Pack Place 1 packet into feeding tube 2 (two) times daily between meals.   ferrous gluconate 324 MG tablet Commonly known as: FERGON Take 324 mg by mouth daily.   fluticasone 50 MCG/ACT nasal spray Commonly known as: FLONASE Place 2 sprays into both nostrils 2 (two) times daily.   free water Soln Place 150 mLs into feeding tube 4 (four) times daily.   gabapentin 300 MG capsule Commonly known as: NEURONTIN Place 1 capsule (300 mg total) into feeding tube at bedtime. What changed: how to take this   insulin aspart 100 UNIT/ML injection Commonly known as: novoLOG Inject 0-15 Units into the skin every 4 (four) hours. Sliding scale  CBG 70 - 120: 0 units: CBG 121 -  150: 2 units; CBG 151 - 200: 3 units; CBG 201 - 250: 5 units; CBG 251 - 300: 8 units;CBG 301 - 350: 11 units; CBG 351 - 400: 15 units; CBG > 400 : 15 units and notify MD What changed:   when to take this  additional instructions   insulin glargine 100 unit/mL Sopn Commonly known as: LANTUS Inject 15 Units into the skin at bedtime. What changed: how much to take   lactulose 10 GM/15ML solution Commonly known as: CHRONULAC Take 20 g by mouth daily.   Lidocaine 4 % Ptch Apply 1 patch topically 2 (two) times daily. Apply to right hip in the morning remove at night   lisinopril 10 MG tablet Commonly known as: ZESTRIL Take 10 mg by mouth daily.   magnesium hydroxide 400 MG/5ML suspension Commonly known as: MILK OF MAGNESIA Take 30 mLs by mouth daily as needed for mild constipation.   metFORMIN 1000 MG tablet Commonly known as: GLUCOPHAGE Take 1,000 mg by mouth 2 (two) times daily with a meal.   ondansetron 8 MG disintegrating tablet Commonly known as: ZOFRAN-ODT Take 8 mg by mouth  daily as needed for nausea or vomiting.   pantoprazole 40 MG tablet Commonly known as: PROTONIX Take 40 mg by mouth daily.   polyvinyl alcohol 1.4 % ophthalmic solution Commonly known as: LIQUIFILM TEARS Place 2 drops into both eyes 2 (two) times daily as needed for dry eyes.   PSYLLIUM PO Take 0.4 g by mouth daily.   senna-docusate 8.6-50 MG tablet Commonly known as: Senokot-S Take 2 tablets by mouth daily.   sertraline 50 MG tablet Commonly known as: ZOLOFT Take 50 mg by mouth daily. Give with 25mg  dose   sertraline 25 MG tablet Commonly known as: ZOLOFT Take 25 mg by mouth daily. Give with 50mg  dose   tamsulosin 0.4 MG Caps capsule Commonly known as: FLOMAX Take 1 capsule (0.4 mg total) by mouth daily after supper.   traZODone 50 MG tablet Commonly known as: DESYREL Take 50 mg by mouth at bedtime.   Vascepa 1 g capsule Generic drug: icosapent Ethyl Take 2 capsules (2 g total)  by mouth 2 (two) times daily.   VITA-C PO Take 500 mg by mouth daily.   vitamin B-12 500 MCG tablet Commonly known as: CYANOCOBALAMIN Take 500 mcg by mouth daily.   zinc sulfate 220 (50 Zn) MG capsule Place 1 capsule (220 mg total) into feeding tube daily. What changed: how to take this            Discharge Care Instructions  (From admission, onward)         Start     Ordered   12/26/20 0000  Discharge wound care:       Comments: Wound care  Daily      Comments: Apply alginate Kellie Simmering # [phone]) to sacrum (2 wounds) Q day, then cover with foam dressing.  (Change foam dressing Q 3 days or PRN soiling. Moisten previous dressing with NS each time to remove   Foam dressing  Until discontinued      Comments: Foam dressings to bilat heels, feet ankles, change Q 3 days or PRN soiling   12/26/20 0810          Allergies  Allergen Reactions  . Levofloxacin Rash  . Penicillins Hives and Rash    Tolerated Unasyn, cefepime and cephalexin    Consultations:  Infectious disease   Procedures/Studies: DG Chest 2 View  Result Date: 12/21/2020 CLINICAL DATA:  Suspected sepsis EXAM: CHEST - 2 VIEW COMPARISON:  11/16/2020 FINDINGS: Lingular opacity is unchanged, likely scarring. Right lung clear. Heart is normal size. No effusions or acute bony abnormality. IMPRESSION: Lingular scarring.  No active disease. Electronically Signed   By: Rolm Baptise M.D.   On: 12/21/2020 17:33   CT Renal Stone Study  Result Date: 12/21/2020 CLINICAL DATA:  Nausea. Decreased output through colostomy and decreased urine output. EXAM: CT ABDOMEN AND PELVIS WITHOUT CONTRAST TECHNIQUE: Multidetector CT imaging of the abdomen and pelvis was performed following the standard protocol without IV contrast. COMPARISON:  11/16/2020 FINDINGS: Lower chest: No acute abnormality. Dense coronary artery calcifications in the visualized right coronary artery. Hepatobiliary: No focal liver abnormality is seen. Status post  cholecystectomy. No biliary dilatation. Pancreas: No focal abnormality or ductal dilatation. Spleen: No focal abnormality.  Normal size. Adrenals/Urinary Tract: Large cyst in the upper pole of the left kidney with layering high-density material, likely milk of calcium. No hydronephrosis. Adrenal glands unremarkable. Foley catheter is in place with decompressed urinary bladder. Bilateral perinephric stranding is stable since prior study. Stomach/Bowel: Left lower quadrant ostomy noted. No  evidence of bowel obstruction. Stomach and small bowel decompressed. Gastrostomy tube within the stomach. Scattered colonic diverticula. Vascular/Lymphatic: Diffuse aortoiliac atherosclerosis. No evidence of aneurysm or adenopathy. Reproductive: No visible focal abnormality. Other: No free fluid or free air. Musculoskeletal: No acute bony abnormality. Diffuse degenerative changes in the lumbar spine. IMPRESSION: No ureteral stones or hydronephrosis. Foley catheter is within the bladder which is decompressed. Left lower quadrant ostomy noted.  No evidence of bowel obstruction. Colonic diverticulosis. Aortoiliac atherosclerosis. Electronically Signed   By: Rolm Baptise M.D.   On: 12/21/2020 20:34      Subjective: No acute issues or events overnight   Discharge Exam: Vitals:   12/25/20 2101 12/26/20 0436  BP: (!) 140/59 123/77  Pulse: 80 85  Resp: 20 20  Temp: 99 F (37.2 C) 98.4 F (36.9 C)  SpO2: 97% 97%   Vitals:   12/25/20 0417 12/25/20 1505 12/25/20 2101 12/26/20 0436  BP: (!) 135/56 (!) 125/58 (!) 140/59 123/77  Pulse: 82 78 80 85  Resp: 18 18 20 20   Temp: 98.5 F (36.9 C) 98.3 F (36.8 C) 99 F (37.2 C) 98.4 F (36.9 C)  TempSrc: Oral Oral Oral Oral  SpO2: 97% 98% 97% 97%  Weight:      Height:       General:  Pleasantly resting in bed, No acute distress. HEENT:  Normocephalic atraumatic.  Sclerae nonicteric, noninjected.  Extraocular movements intact bilaterally. Neck:  Without mass or  deformity.  Trachea is midline. Lungs:  Clear to auscultate bilaterally without rhonchi, wheeze, or rales. Heart:  Regular rate and rhythm.  Without murmurs, rubs, or gallops. Abdomen:  Soft, nontender, nondistended.  Without guarding or rebound. Extremities: Without cyanosis, clubbing, edema, or obvious deformity.  Limited range of motion and strength in bilateral upper extremities, flaccid lower extremities. Vascular:  Dorsalis pedis and posterior tibial pulses palpable bilaterally. Skin:  Warm and dry, no erythema, sacral decubitus ulcer stage IV per documentation.    The results of significant diagnostics from this hospitalization (including imaging, microbiology, ancillary and laboratory) are listed below for reference.     Microbiology: Recent Results (from the past 240 hour(s))  Culture, blood (Routine x 2)     Status: Abnormal   Collection Time: 12/21/20  4:50 PM   Specimen: BLOOD  Result Value Ref Range Status   Specimen Description   Final    BLOOD LEFT ANTECUBITAL Performed at [facility] [address], [place], [place] [address]    Special Requests   Final    BOTTLES DRAWN AEROBIC AND ANAEROBIC Blood Culture adequate volume Performed at [facility] [address], [place], [place] [address]    Culture  Setup Time   Final    GRAM NEGATIVE RODS IN BOTH AEROBIC AND ANAEROBIC BOTTLES CRITICAL VALUE NOTED.  VALUE IS CONSISTENT WITH PREVIOUSLY REPORTED AND CALLED VALUE.    Culture (A)  Final    PROTEUS MIRABILIS SUSCEPTIBILITIES PERFORMED ON PREVIOUS CULTURE WITHIN THE LAST 5 DAYS. Performed at [facility], [place] [address], [place], [place] [address]    Report Status 12/24/2020 FINAL  Final  Culture, blood (Routine x 2)     Status: Abnormal   Collection Time: 12/21/20  4:54 PM   Specimen: BLOOD RIGHT HAND  Result Value Ref Range Status   Specimen Description   Final    BLOOD RIGHT HAND Performed at [facility] [address] [place],  [address]    Special  Requests   Final    BOTTLES DRAWN AEROBIC AND ANAEROBIC Blood Culture adequate volume Performed at [facility] [address] [place], [place] [address]    Culture  Setup Time   Final    GRAM NEGATIVE RODS IN BOTH AEROBIC AND ANAEROBIC BOTTLES CRITICAL RESULT CALLED TO, READ BACK BY AND VERIFIED WITH: PHARMD A. PHAM [id number] FCP Performed at [facility] [address] [place], [place] [address]    Culture PROTEUS MIRABILIS (A)  Final   Report Status 12/24/2020 FINAL  Final   Organism ID, Bacteria PROTEUS MIRABILIS  Final      Susceptibility   Proteus mirabilis - MIC*    AMPICILLIN <=2 SENSITIVE Sensitive     CEFAZOLIN <=4 SENSITIVE Sensitive     CEFEPIME <=0.12 SENSITIVE Sensitive     CEFTAZIDIME <=1 SENSITIVE Sensitive     CEFTRIAXONE <=0.25 SENSITIVE Sensitive     CIPROFLOXACIN <=0.25 SENSITIVE Sensitive     GENTAMICIN <=1 SENSITIVE Sensitive     IMIPENEM 2 SENSITIVE Sensitive     TRIMETH/SULFA >=320 RESISTANT Resistant     AMPICILLIN/SULBACTAM <=2 SENSITIVE Sensitive     PIP/TAZO <=4 SENSITIVE Sensitive     * PROTEUS MIRABILIS  Blood Culture ID Panel (Reflexed)     Status: Abnormal   Collection Time: 12/21/20  4:54 PM  Result Value Ref Range Status   Enterococcus faecalis NOT DETECTED NOT DETECTED Final   Enterococcus Faecium NOT DETECTED NOT DETECTED Final   Listeria monocytogenes NOT DETECTED NOT DETECTED Final   Staphylococcus species NOT DETECTED NOT DETECTED Final   Staphylococcus aureus (BCID) NOT DETECTED NOT DETECTED Final   Staphylococcus epidermidis NOT DETECTED NOT DETECTED Final   Staphylococcus lugdunensis NOT DETECTED NOT DETECTED Final   Streptococcus species NOT DETECTED NOT DETECTED Final   Streptococcus agalactiae NOT DETECTED NOT DETECTED Final   Streptococcus pneumoniae NOT DETECTED NOT DETECTED Final   Streptococcus pyogenes NOT DETECTED NOT  DETECTED Final   A.calcoaceticus-baumannii NOT DETECTED NOT DETECTED Final   Bacteroides fragilis NOT DETECTED NOT DETECTED Final   Enterobacterales DETECTED (A) NOT DETECTED Final    Comment: Enterobacterales represent a large order of gram negative bacteria, not a single organism. CRITICAL RESULT CALLED TO, READ BACK BY AND VERIFIED WITH: PHARMD A. PHAM [id number] FCP    Enterobacter cloacae complex NOT DETECTED NOT DETECTED Final   Escherichia coli NOT DETECTED NOT DETECTED Final   Klebsiella aerogenes NOT DETECTED NOT DETECTED Final   Klebsiella oxytoca NOT DETECTED NOT DETECTED Final   Klebsiella pneumoniae NOT DETECTED NOT DETECTED Final   Proteus species DETECTED (A) NOT DETECTED Final    Comment: CRITICAL RESULT CALLED TO, READ BACK BY AND VERIFIED WITH: PHARMD A. PHAM [id number] [id number] FCP    Salmonella species NOT DETECTED NOT DETECTED Final   Serratia marcescens NOT DETECTED NOT DETECTED Final   Haemophilus influenzae NOT DETECTED NOT DETECTED Final   Neisseria meningitidis NOT DETECTED NOT DETECTED Final   Pseudomonas aeruginosa NOT DETECTED NOT DETECTED Final   Stenotrophomonas maltophilia NOT DETECTED NOT DETECTED Final   Candida albicans NOT DETECTED NOT DETECTED Final   Candida auris NOT DETECTED NOT DETECTED Final   Candida glabrata NOT DETECTED NOT DETECTED Final   Candida krusei NOT DETECTED NOT DETECTED Final   Candida parapsilosis NOT DETECTED NOT DETECTED Final   Candida tropicalis NOT DETECTED NOT DETECTED Final   Cryptococcus neoformans/gattii NOT DETECTED NOT DETECTED Final   CTX-M ESBL NOT DETECTED NOT DETECTED Final  Carbapenem resistance IMP NOT DETECTED NOT DETECTED Final   Carbapenem resistance KPC NOT DETECTED NOT DETECTED Final   Carbapenem resistance NDM NOT DETECTED NOT DETECTED Final   Carbapenem resist OXA 48 LIKE NOT DETECTED NOT DETECTED Final   Carbapenem resistance VIM NOT DETECTED NOT DETECTED Final    Comment: Performed at [facility], [place] [address] [place], [place] [address]  SARS CORONAVIRUS 2 (TAT 6-24 HRS) Nasopharyngeal Nasopharyngeal Swab     Status: None   Collection Time: 12/21/20  5:04 PM   Specimen: Nasopharyngeal Swab  Result Value Ref Range Status   SARS Coronavirus 2 NEGATIVE NEGATIVE Final    Comment: (NOTE) SARS-CoV-2 target nucleic acids are NOT DETECTED.  The SARS-CoV-2 RNA is generally detectable in upper and lower respiratory specimens during the acute phase of infection. Negative results do not preclude SARS-CoV-2 infection, do not rule out co-infections with other pathogens, and should not be used as the sole basis for treatment or other patient management decisions. Negative results must be combined with clinical observations, patient history, and epidemiological information. The expected result is Negative.  Fact Sheet for Patients: SugarRoll.be  Fact Sheet for Healthcare Providers: https://www.woods-mathews.com/  This test is not yet approved or cleared by the [place] FDA and  has been authorized for detection and/or diagnosis of SARS-CoV-2 by FDA under an Emergency Use Authorization (EUA). This EUA will remain  in effect (meaning this test can be used) for the duration of the COVID-19 declaration under Se ction 564(b)(1) of the Act, 21 U.S.C. section 360bbb-3(b)(1), unless the authorization is terminated or revoked sooner.  Performed at [facility], [place] [address] [place], [place] [address]   Culture, Urine     Status: Abnormal   Collection Time: 12/21/20  5:04 PM   Specimen: Urine, Clean Catch  Result Value Ref Range Status   Specimen Description   Final    URINE, CLEAN CATCH Performed at [facility], [place] [address], [place], [place] [address]    Special Requests   Final    NONE Performed at [facility], [place] [address] [place], [place] [address]    Culture >=100,000 COLONIES/mL  PROTEUS MIRABILIS (A)  Final   Report Status 12/24/2020 FINAL  Final   Organism ID, Bacteria PROTEUS MIRABILIS (A)  Final      Susceptibility   Proteus mirabilis - MIC*    AMPICILLIN >=32 RESISTANT Resistant     CEFAZOLIN <=4 SENSITIVE Sensitive     CEFEPIME <=0.12 SENSITIVE Sensitive     CEFTRIAXONE <=0.25 SENSITIVE Sensitive     CIPROFLOXACIN INTERMEDIATE Intermediate     GENTAMICIN <=1 SENSITIVE Sensitive     IMIPENEM 4 SENSITIVE Sensitive     NITROFURANTOIN 128 RESISTANT Resistant     TRIMETH/SULFA >=320 RESISTANT Resistant     AMPICILLIN/SULBACTAM <=2 SENSITIVE Sensitive     PIP/TAZO <=4 SENSITIVE Sensitive     * >=100,000 COLONIES/mL PROTEUS MIRABILIS  SARS CORONAVIRUS 2 (TAT 6-24 HRS) Nasopharyngeal Nasopharyngeal Swab     Status: None   Collection Time: 12/25/20 12:48 PM   Specimen: Nasopharyngeal Swab  Result Value Ref Range Status   SARS Coronavirus 2 NEGATIVE NEGATIVE Final    Comment: (NOTE) SARS-CoV-2 target nucleic acids are NOT DETECTED.  The SARS-CoV-2 RNA is generally detectable in upper and lower respiratory specimens during the acute phase of infection. Negative results do not preclude SARS-CoV-2 infection, do not rule out co-infections with other pathogens, and should not be  used as the sole basis for treatment or other patient management decisions. Negative results must be combined with clinical observations, patient history, and epidemiological information. The expected result is Negative.  Fact Sheet for Patients: SugarRoll.be  Fact Sheet for Healthcare Providers: https://www.woods-mathews.com/  This test is not yet approved or cleared by the [place] FDA and  has been authorized for detection and/or diagnosis of SARS-CoV-2 by FDA under an Emergency Use Authorization (EUA). This EUA will remain  in effect (meaning this test can be used) for the duration of the COVID-19 declaration under Se ction 564(b)(1)  of the Act, 21 U.S.C. section 360bbb-3(b)(1), unless the authorization is terminated or revoked sooner.  Performed at [facility], [place] [address], [place], [place] [address]      Labs: BNP (last 3 results) No results for input(s): BNP in the last 8760 hours. Basic Metabolic Panel: Recent Labs  Lab 12/22/20 0457 12/23/20 0401 12/24/20 0356 12/25/20 0359 12/26/20 0356  NA 138 136 140 137 139  K 3.8 3.2* 3.2* 3.0* 3.1*  CL 106 105 107 108 107  CO2 20* 20* 23 21* 23  GLUCOSE 183* 133* 168* 160* 168*  BUN 53* 53* 38* 23 16  CREATININE 2.47* 1.29* 0.87 0.47* 0.57*  CALCIUM 7.6* 7.5* 8.1* 7.6* 7.6*   Liver Function Tests: Recent Labs  Lab 12/21/20 1650  AST 14*  ALT 12  ALKPHOS 89  BILITOT 0.7  PROT 6.0*  ALBUMIN 2.4*   No results for input(s): LIPASE, AMYLASE in the last 168 hours. No results for input(s): AMMONIA in the last 168 hours. CBC: Recent Labs  Lab 12/21/20 1650 12/22/20 0457 12/23/20 0401 12/24/20 0356 12/25/20 0359 12/26/20 0356  WBC 12.8* 10.6* 6.1 8.3 10.1 13.1*  NEUTROABS 11.8*  --  5.4  --   --   --   HGB 9.2* 8.6* 7.6* 9.2* 8.5* 8.6*  HCT 30.5* 28.7* 26.1* 31.3* 28.8* 29.6*  MCV 78.8* 78.4* 80.8 80.7 79.3* 80.2  PLT 285 255 200 211 181 221   Cardiac Enzymes: No results for input(s): CKTOTAL, CKMB, CKMBINDEX, TROPONINI in the last 168 hours. BNP: Invalid input(s): POCBNP CBG: Recent Labs  Lab 12/25/20 1143 12/25/20 1632 12/25/20 2059 12/25/20 2345 12/26/20 0432  GLUCAP 222* 147* 156* 136* 149*   D-Dimer No results for input(s): DDIMER in the last 72 hours. Hgb A1c No results for input(s): HGBA1C in the last 72 hours. Lipid Profile No results for input(s): CHOL, HDL, LDLCALC, TRIG, CHOLHDL, LDLDIRECT in the last 72 hours. Thyroid function studies No results for input(s): TSH, T4TOTAL, T3FREE, THYROIDAB in the last 72 hours.  Invalid input(s): FREET3 Anemia work up No results for input(s): VITAMINB12, FOLATE, FERRITIN,  TIBC, IRON, RETICCTPCT in the last 72 hours. Urinalysis    Component Value Date/Time   COLORURINE YELLOW 12/21/2020 1704   APPEARANCEUR TURBID (A) 12/21/2020 1704   LABSPEC 1.014 12/21/2020 1704   PHURINE 5.0 12/21/2020 1704   GLUCOSEU NEGATIVE 12/21/2020 1704   HGBUR LARGE (A) 12/21/2020 1704   BILIRUBINUR NEGATIVE 12/21/2020 1704   BILIRUBINUR n 03/18/2015 0928   KETONESUR NEGATIVE 12/21/2020 1704   PROTEINUR 100 (A) 12/21/2020 1704   UROBILINOGEN 2.0 03/18/2015 0928   UROBILINOGEN 1.0 07/16/2012 1345   NITRITE NEGATIVE 12/21/2020 1704   LEUKOCYTESUR MODERATE (A) 12/21/2020 1704   Sepsis Labs Invalid input(s): PROCALCITONIN,  WBC,  LACTICIDVEN Microbiology Recent Results (from the past 240 hour(s))  Culture, blood (Routine x 2)     Status: Abnormal  Collection Time: 12/21/20  4:50 PM   Specimen: BLOOD  Result Value Ref Range Status   Specimen Description   Final    BLOOD LEFT ANTECUBITAL Performed at [facility] [address] [place], [place] [address]    Special Requests   Final    BOTTLES DRAWN AEROBIC AND ANAEROBIC Blood Culture adequate volume Performed at [facility] [address] [place], [place] [address]    Culture  Setup Time   Final    GRAM NEGATIVE RODS IN BOTH AEROBIC AND ANAEROBIC BOTTLES CRITICAL VALUE NOTED.  VALUE IS CONSISTENT WITH PREVIOUSLY REPORTED AND CALLED VALUE.    Culture (A)  Final    PROTEUS MIRABILIS SUSCEPTIBILITIES PERFORMED ON PREVIOUS CULTURE WITHIN THE LAST 5 DAYS. Performed at [facility], [place] [address] [place], [place] [address]    Report Status 12/24/2020 FINAL  Final  Culture, blood (Routine x 2)     Status: Abnormal   Collection Time: 12/21/20  4:54 PM   Specimen: BLOOD RIGHT HAND  Result Value Ref Range Status   Specimen Description   Final    BLOOD RIGHT HAND Performed at [facility] [address], [place],  [address]    Special Requests    Final    BOTTLES DRAWN AEROBIC AND ANAEROBIC Blood Culture adequate volume Performed at [facility] [address] [place], [place] [address]    Culture  Setup Time   Final    GRAM NEGATIVE RODS IN BOTH AEROBIC AND ANAEROBIC BOTTLES CRITICAL RESULT CALLED TO, READ BACK BY AND VERIFIED WITH: PHARMD A. PHAM 1423 [id number] FCP Performed at [facility] [address], [place],  [address]    Culture PROTEUS MIRABILIS (A)  Final   Report Status 12/24/2020 FINAL  Final   Organism ID, Bacteria PROTEUS MIRABILIS  Final      Susceptibility   Proteus mirabilis - MIC*    AMPICILLIN <=2 SENSITIVE Sensitive     CEFAZOLIN <=4 SENSITIVE Sensitive     CEFEPIME <=0.12 SENSITIVE Sensitive     CEFTAZIDIME <=1 SENSITIVE Sensitive     CEFTRIAXONE <=0.25 SENSITIVE Sensitive     CIPROFLOXACIN <=0.25 SENSITIVE Sensitive     GENTAMICIN <=1 SENSITIVE Sensitive     IMIPENEM 2 SENSITIVE Sensitive     TRIMETH/SULFA >=320 RESISTANT Resistant     AMPICILLIN/SULBACTAM <=2 SENSITIVE Sensitive     PIP/TAZO <=4 SENSITIVE Sensitive     * PROTEUS MIRABILIS  Blood Culture ID Panel (Reflexed)     Status: Abnormal   Collection Time: 12/21/20  4:54 PM  Result Value Ref Range Status   Enterococcus faecalis NOT DETECTED NOT DETECTED Final   Enterococcus Faecium NOT DETECTED NOT DETECTED Final   Listeria monocytogenes NOT DETECTED NOT DETECTED Final   Staphylococcus species NOT DETECTED NOT DETECTED Final   Staphylococcus aureus (BCID) NOT DETECTED NOT DETECTED Final   Staphylococcus epidermidis NOT DETECTED NOT DETECTED Final   Staphylococcus lugdunensis NOT DETECTED NOT DETECTED Final   Streptococcus species NOT DETECTED NOT DETECTED Final   Streptococcus agalactiae NOT DETECTED NOT DETECTED Final   Streptococcus pneumoniae NOT DETECTED NOT DETECTED Final   Streptococcus pyogenes NOT DETECTED NOT DETECTED Final   A.calcoaceticus-baumannii NOT DETECTED NOT DETECTED Final    Bacteroides fragilis NOT DETECTED NOT DETECTED Final   Enterobacterales DETECTED (A) NOT DETECTED Final    Comment: Enterobacterales represent a large order of gram negative bacteria, not a single organism. CRITICAL RESULT  CALLED TO, READ BACK BY AND VERIFIED WITH: PHARMD A. PHAM 1423 [id number] FCP    Enterobacter cloacae complex NOT DETECTED NOT DETECTED Final   Escherichia coli NOT DETECTED NOT DETECTED Final   Klebsiella aerogenes NOT DETECTED NOT DETECTED Final   Klebsiella oxytoca NOT DETECTED NOT DETECTED Final   Klebsiella pneumoniae NOT DETECTED NOT DETECTED Final   Proteus species DETECTED (A) NOT DETECTED Final    Comment: CRITICAL RESULT CALLED TO, READ BACK BY AND VERIFIED WITH: PHARMD A. PHAM 1423 [id number] FCP    Salmonella species NOT DETECTED NOT DETECTED Final   Serratia marcescens NOT DETECTED NOT DETECTED Final   Haemophilus influenzae NOT DETECTED NOT DETECTED Final   Neisseria meningitidis NOT DETECTED NOT DETECTED Final   Pseudomonas aeruginosa NOT DETECTED NOT DETECTED Final   Stenotrophomonas maltophilia NOT DETECTED NOT DETECTED Final   Candida albicans NOT DETECTED NOT DETECTED Final   Candida auris NOT DETECTED NOT DETECTED Final   Candida glabrata NOT DETECTED NOT DETECTED Final   Candida krusei NOT DETECTED NOT DETECTED Final   Candida parapsilosis NOT DETECTED NOT DETECTED Final   Candida tropicalis NOT DETECTED NOT DETECTED Final   Cryptococcus neoformans/gattii NOT DETECTED NOT DETECTED Final   CTX-M ESBL NOT DETECTED NOT DETECTED Final   Carbapenem resistance IMP NOT DETECTED NOT DETECTED Final   Carbapenem resistance KPC NOT DETECTED NOT DETECTED Final   Carbapenem resistance NDM NOT DETECTED NOT DETECTED Final   Carbapenem resist OXA 48 LIKE NOT DETECTED NOT DETECTED Final   Carbapenem resistance VIM NOT DETECTED NOT DETECTED Final    Comment: Performed at [facility], [address] [place], [place] [address]  SARS CORONAVIRUS 2 (TAT 6-24 HRS)  Nasopharyngeal Nasopharyngeal Swab     Status: None   Collection Time: 12/21/20  5:04 PM   Specimen: Nasopharyngeal Swab  Result Value Ref Range Status   SARS Coronavirus 2 NEGATIVE NEGATIVE Final    Comment: (NOTE) SARS-CoV-2 target nucleic acids are NOT DETECTED.  The SARS-CoV-2 RNA is generally detectable in upper and lower respiratory specimens during the acute phase of infection. Negative results do not preclude SARS-CoV-2 infection, do not rule out co-infections with other pathogens, and should not be used as the sole basis for treatment or other patient management decisions. Negative results must be combined with clinical observations, patient history, and epidemiological information. The expected result is Negative.  Fact Sheet for Patients: SugarRoll.be  Fact Sheet for Healthcare Providers: https://www.woods-mathews.com/  This test is not yet approved or cleared by the [place] FDA and  has been authorized for detection and/or diagnosis of SARS-CoV-2 by FDA under an Emergency Use Authorization (EUA). This EUA will remain  in effect (meaning this test can be used) for the duration of the COVID-19 declaration under Se ction 564(b)(1) of the Act, 21 U.S.C. section 360bbb-3(b)(1), unless the authorization is terminated or revoked sooner.  Performed at [facility], [place] [address], [place], [place] [address]   Culture, Urine     Status: Abnormal   Collection Time: 12/21/20  5:04 PM   Specimen: Urine, Clean Catch  Result Value Ref Range Status   Specimen Description   Final    URINE, CLEAN CATCH Performed at [facility], [place] [address], [place], [place] [address]    Special Requests   Final    NONE Performed at [facility], [place] [address] [place], [place] [address]    Culture >=100,000 COLONIES/mL PROTEUS MIRABILIS (A)  Final   Report Status 12/24/2020 FINAL  Final   Organism  ID, Bacteria PROTEUS MIRABILIS (A)  Final      Susceptibility   Proteus mirabilis - MIC*    AMPICILLIN >=32 RESISTANT Resistant     CEFAZOLIN <=4 SENSITIVE Sensitive     CEFEPIME <=0.12 SENSITIVE Sensitive     CEFTRIAXONE <=0.25 SENSITIVE Sensitive     CIPROFLOXACIN INTERMEDIATE Intermediate     GENTAMICIN <=1 SENSITIVE Sensitive     IMIPENEM 4 SENSITIVE Sensitive     NITROFURANTOIN 128 RESISTANT Resistant     TRIMETH/SULFA >=320 RESISTANT Resistant     AMPICILLIN/SULBACTAM <=2 SENSITIVE Sensitive     PIP/TAZO <=4 SENSITIVE Sensitive     * >=100,000 COLONIES/mL PROTEUS MIRABILIS  SARS CORONAVIRUS 2 (TAT 6-24 HRS) Nasopharyngeal Nasopharyngeal Swab     Status: None   Collection Time: 12/25/20 12:48 PM   Specimen: Nasopharyngeal Swab  Result Value Ref Range Status   SARS Coronavirus 2 NEGATIVE NEGATIVE Final    Comment: (NOTE) SARS-CoV-2 target nucleic acids are NOT DETECTED.  The SARS-CoV-2 RNA is generally detectable in upper and lower respiratory specimens during the acute phase of infection. Negative results do not preclude SARS-CoV-2 infection, do not rule out co-infections with other pathogens, and should not be used as the sole basis for treatment or other patient management decisions. Negative results must be combined with clinical observations, patient history, and epidemiological information. The expected result is Negative.  Fact Sheet for Patients: SugarRoll.be  Fact Sheet for Healthcare Providers: https://www.woods-mathews.com/  This test is not yet approved or cleared by the [place] FDA and  has been authorized for detection and/or diagnosis of SARS-CoV-2 by FDA under an Emergency Use Authorization (EUA). This EUA will remain  in effect (meaning this test can be used) for the duration of the COVID-19 declaration under Se ction 564(b)(1) of the Act, 21 U.S.C. section 360bbb-3(b)(1), unless the authorization is  terminated or revoked sooner.  Performed at [facility], [place] [address], [place], [place] [address]      Time coordinating discharge: Over 30 minutes  SIGNED:   Little Ishikawa, DO [facility] 12/26/2020, 8:10 AM Pager   If 7PM-7AM, please contact night-coverage [email or web address]

## 2020-12-28 DIAGNOSIS — E782 Mixed hyperlipidemia: Secondary | ICD-10-CM | POA: Diagnosis not present

## 2020-12-28 DIAGNOSIS — I2510 Atherosclerotic heart disease of native coronary artery without angina pectoris: Secondary | ICD-10-CM | POA: Diagnosis not present

## 2020-12-28 DIAGNOSIS — N319 Neuromuscular dysfunction of bladder, unspecified: Secondary | ICD-10-CM | POA: Diagnosis not present

## 2020-12-28 DIAGNOSIS — I10 Essential (primary) hypertension: Secondary | ICD-10-CM | POA: Diagnosis not present

## 2020-12-28 DIAGNOSIS — G373 Acute transverse myelitis in demyelinating disease of central nervous system: Secondary | ICD-10-CM | POA: Diagnosis not present

## 2020-12-28 DIAGNOSIS — K592 Neurogenic bowel, not elsewhere classified: Secondary | ICD-10-CM | POA: Diagnosis not present

## 2020-12-28 DIAGNOSIS — E119 Type 2 diabetes mellitus without complications: Secondary | ICD-10-CM | POA: Diagnosis not present

## 2020-12-28 DIAGNOSIS — G4733 Obstructive sleep apnea (adult) (pediatric): Secondary | ICD-10-CM | POA: Diagnosis not present

## 2020-12-28 DIAGNOSIS — G8250 Quadriplegia, unspecified: Secondary | ICD-10-CM | POA: Diagnosis not present

## 2020-12-28 DIAGNOSIS — N390 Urinary tract infection, site not specified: Secondary | ICD-10-CM | POA: Diagnosis not present

## 2020-12-28 DIAGNOSIS — Z7409 Other reduced mobility: Secondary | ICD-10-CM | POA: Diagnosis not present

## 2020-12-28 DIAGNOSIS — E1142 Type 2 diabetes mellitus with diabetic polyneuropathy: Secondary | ICD-10-CM | POA: Diagnosis not present

## 2020-12-29 DIAGNOSIS — N179 Acute kidney failure, unspecified: Secondary | ICD-10-CM | POA: Diagnosis not present

## 2020-12-29 DIAGNOSIS — T839XXA Unspecified complication of genitourinary prosthetic device, implant and graft, initial encounter: Secondary | ICD-10-CM | POA: Diagnosis not present

## 2020-12-29 DIAGNOSIS — L89890 Pressure ulcer of other site, unstageable: Secondary | ICD-10-CM | POA: Diagnosis not present

## 2020-12-29 DIAGNOSIS — I700 Atherosclerosis of aorta: Secondary | ICD-10-CM | POA: Diagnosis not present

## 2020-12-29 DIAGNOSIS — L89613 Pressure ulcer of right heel, stage 3: Secondary | ICD-10-CM | POA: Diagnosis not present

## 2020-12-29 DIAGNOSIS — L89510 Pressure ulcer of right ankle, unstageable: Secondary | ICD-10-CM | POA: Diagnosis not present

## 2020-12-29 DIAGNOSIS — N390 Urinary tract infection, site not specified: Secondary | ICD-10-CM | POA: Diagnosis not present

## 2020-12-29 DIAGNOSIS — E7800 Pure hypercholesterolemia, unspecified: Secondary | ICD-10-CM | POA: Diagnosis not present

## 2020-12-29 DIAGNOSIS — R6520 Severe sepsis without septic shock: Secondary | ICD-10-CM | POA: Diagnosis not present

## 2020-12-29 DIAGNOSIS — G8220 Paraplegia, unspecified: Secondary | ICD-10-CM | POA: Diagnosis not present

## 2020-12-29 DIAGNOSIS — A4189 Other specified sepsis: Secondary | ICD-10-CM | POA: Diagnosis not present

## 2020-12-29 DIAGNOSIS — L89620 Pressure ulcer of left heel, unstageable: Secondary | ICD-10-CM | POA: Diagnosis not present

## 2020-12-29 DIAGNOSIS — L89210 Pressure ulcer of right hip, unstageable: Secondary | ICD-10-CM | POA: Diagnosis not present

## 2020-12-29 DIAGNOSIS — B964 Proteus (mirabilis) (morganii) as the cause of diseases classified elsewhere: Secondary | ICD-10-CM | POA: Diagnosis not present

## 2020-12-29 DIAGNOSIS — L89154 Pressure ulcer of sacral region, stage 4: Secondary | ICD-10-CM | POA: Diagnosis not present

## 2020-12-29 DIAGNOSIS — I708 Atherosclerosis of other arteries: Secondary | ICD-10-CM | POA: Diagnosis not present

## 2020-12-29 DIAGNOSIS — E118 Type 2 diabetes mellitus with unspecified complications: Secondary | ICD-10-CM | POA: Diagnosis not present

## 2020-12-30 DIAGNOSIS — I10 Essential (primary) hypertension: Secondary | ICD-10-CM | POA: Diagnosis not present

## 2020-12-30 DIAGNOSIS — T783XXA Angioneurotic edema, initial encounter: Secondary | ICD-10-CM | POA: Diagnosis not present

## 2020-12-30 DIAGNOSIS — K121 Other forms of stomatitis: Secondary | ICD-10-CM | POA: Diagnosis not present

## 2020-12-30 DIAGNOSIS — Z880 Allergy status to penicillin: Secondary | ICD-10-CM | POA: Diagnosis not present

## 2020-12-30 LAB — CULTURE, BLOOD (ROUTINE X 2)
Culture: NO GROWTH
Culture: NO GROWTH

## 2020-12-31 DIAGNOSIS — E876 Hypokalemia: Secondary | ICD-10-CM | POA: Diagnosis not present

## 2020-12-31 DIAGNOSIS — N390 Urinary tract infection, site not specified: Secondary | ICD-10-CM | POA: Diagnosis not present

## 2020-12-31 DIAGNOSIS — Z931 Gastrostomy status: Secondary | ICD-10-CM | POA: Diagnosis not present

## 2020-12-31 DIAGNOSIS — I152 Hypertension secondary to endocrine disorders: Secondary | ICD-10-CM | POA: Diagnosis not present

## 2020-12-31 DIAGNOSIS — D6489 Other specified anemias: Secondary | ICD-10-CM | POA: Diagnosis not present

## 2020-12-31 DIAGNOSIS — L89304 Pressure ulcer of unspecified buttock, stage 4: Secondary | ICD-10-CM | POA: Diagnosis not present

## 2020-12-31 DIAGNOSIS — R6520 Severe sepsis without septic shock: Secondary | ICD-10-CM | POA: Diagnosis not present

## 2020-12-31 DIAGNOSIS — Z794 Long term (current) use of insulin: Secondary | ICD-10-CM | POA: Diagnosis not present

## 2020-12-31 DIAGNOSIS — E1159 Type 2 diabetes mellitus with other circulatory complications: Secondary | ICD-10-CM | POA: Diagnosis not present

## 2020-12-31 DIAGNOSIS — A4189 Other specified sepsis: Secondary | ICD-10-CM | POA: Diagnosis not present

## 2020-12-31 DIAGNOSIS — R030 Elevated blood-pressure reading, without diagnosis of hypertension: Secondary | ICD-10-CM | POA: Diagnosis not present

## 2020-12-31 DIAGNOSIS — G8220 Paraplegia, unspecified: Secondary | ICD-10-CM | POA: Diagnosis not present

## 2021-01-04 DIAGNOSIS — Z7409 Other reduced mobility: Secondary | ICD-10-CM | POA: Diagnosis not present

## 2021-01-04 DIAGNOSIS — E119 Type 2 diabetes mellitus without complications: Secondary | ICD-10-CM | POA: Diagnosis not present

## 2021-01-04 DIAGNOSIS — G8250 Quadriplegia, unspecified: Secondary | ICD-10-CM | POA: Diagnosis not present

## 2021-01-04 DIAGNOSIS — N319 Neuromuscular dysfunction of bladder, unspecified: Secondary | ICD-10-CM | POA: Diagnosis not present

## 2021-01-04 DIAGNOSIS — G4733 Obstructive sleep apnea (adult) (pediatric): Secondary | ICD-10-CM | POA: Diagnosis not present

## 2021-01-04 DIAGNOSIS — I2510 Atherosclerotic heart disease of native coronary artery without angina pectoris: Secondary | ICD-10-CM | POA: Diagnosis not present

## 2021-01-04 DIAGNOSIS — N390 Urinary tract infection, site not specified: Secondary | ICD-10-CM | POA: Diagnosis not present

## 2021-01-04 DIAGNOSIS — K592 Neurogenic bowel, not elsewhere classified: Secondary | ICD-10-CM | POA: Diagnosis not present

## 2021-01-04 DIAGNOSIS — I10 Essential (primary) hypertension: Secondary | ICD-10-CM | POA: Diagnosis not present

## 2021-01-04 DIAGNOSIS — E782 Mixed hyperlipidemia: Secondary | ICD-10-CM | POA: Diagnosis not present

## 2021-01-04 DIAGNOSIS — G373 Acute transverse myelitis in demyelinating disease of central nervous system: Secondary | ICD-10-CM | POA: Diagnosis not present

## 2021-01-04 DIAGNOSIS — E1142 Type 2 diabetes mellitus with diabetic polyneuropathy: Secondary | ICD-10-CM | POA: Diagnosis not present

## 2021-01-05 ENCOUNTER — Other Ambulatory Visit: Payer: Self-pay

## 2021-01-05 ENCOUNTER — Emergency Department (HOSPITAL_COMMUNITY)
Admission: EM | Admit: 2021-01-05 | Discharge: 2021-01-05 | Disposition: A | Discharge status: Home / Self Care | Payer: Medicare Other | Attending: Emergency Medicine | Admitting: Emergency Medicine

## 2021-01-05 ENCOUNTER — Encounter (HOSPITAL_COMMUNITY): Payer: Self-pay | Admitting: Emergency Medicine

## 2021-01-05 DIAGNOSIS — Z87891 Personal history of nicotine dependence: Secondary | ICD-10-CM | POA: Insufficient documentation

## 2021-01-05 DIAGNOSIS — Z85828 Personal history of other malignant neoplasm of skin: Secondary | ICD-10-CM | POA: Insufficient documentation

## 2021-01-05 DIAGNOSIS — L89210 Pressure ulcer of right hip, unstageable: Secondary | ICD-10-CM | POA: Diagnosis not present

## 2021-01-05 DIAGNOSIS — Z79899 Other long term (current) drug therapy: Secondary | ICD-10-CM | POA: Diagnosis not present

## 2021-01-05 DIAGNOSIS — Z7984 Long term (current) use of oral hypoglycemic drugs: Secondary | ICD-10-CM | POA: Diagnosis not present

## 2021-01-05 DIAGNOSIS — Z7982 Long term (current) use of aspirin: Secondary | ICD-10-CM | POA: Insufficient documentation

## 2021-01-05 DIAGNOSIS — L89154 Pressure ulcer of sacral region, stage 4: Secondary | ICD-10-CM | POA: Diagnosis not present

## 2021-01-05 DIAGNOSIS — Z794 Long term (current) use of insulin: Secondary | ICD-10-CM | POA: Diagnosis not present

## 2021-01-05 DIAGNOSIS — A4189 Other specified sepsis: Secondary | ICD-10-CM | POA: Diagnosis not present

## 2021-01-05 DIAGNOSIS — Z85038 Personal history of other malignant neoplasm of large intestine: Secondary | ICD-10-CM | POA: Insufficient documentation

## 2021-01-05 DIAGNOSIS — K9423 Gastrostomy malfunction: Secondary | ICD-10-CM | POA: Insufficient documentation

## 2021-01-05 DIAGNOSIS — G8220 Paraplegia, unspecified: Secondary | ICD-10-CM | POA: Diagnosis not present

## 2021-01-05 DIAGNOSIS — I10 Essential (primary) hypertension: Secondary | ICD-10-CM | POA: Insufficient documentation

## 2021-01-05 DIAGNOSIS — L89613 Pressure ulcer of right heel, stage 3: Secondary | ICD-10-CM | POA: Diagnosis not present

## 2021-01-05 DIAGNOSIS — L89890 Pressure ulcer of other site, unstageable: Secondary | ICD-10-CM | POA: Diagnosis not present

## 2021-01-05 DIAGNOSIS — I2510 Atherosclerotic heart disease of native coronary artery without angina pectoris: Secondary | ICD-10-CM | POA: Diagnosis not present

## 2021-01-05 DIAGNOSIS — E1140 Type 2 diabetes mellitus with diabetic neuropathy, unspecified: Secondary | ICD-10-CM | POA: Diagnosis not present

## 2021-01-05 DIAGNOSIS — L89620 Pressure ulcer of left heel, unstageable: Secondary | ICD-10-CM | POA: Diagnosis not present

## 2021-01-05 DIAGNOSIS — L89510 Pressure ulcer of right ankle, unstageable: Secondary | ICD-10-CM | POA: Diagnosis not present

## 2021-01-05 DIAGNOSIS — T07XXXA Unspecified multiple injuries, initial encounter: Secondary | ICD-10-CM | POA: Diagnosis not present

## 2021-01-05 NOTE — ED Notes (Signed)
PTAR called  

## 2021-01-05 NOTE — Discharge Instructions (Signed)
We replaced your G tube and it flushed well.  Please follow up with your surgeon in about a week to make sure that everything continues to be okay.  If you start having fevers, pain at the site, or it is not flushing well, please come back.

## 2021-01-05 NOTE — ED Triage Notes (Signed)
Pt arrives via EMS from [facility]. His G tube was coming out this morning, so staff completely removed it. Pt denies any pain, N/V. Pt is alert/oriented x4. BP 142/68, HR 80, CBG 276. Pt has not had any medications today.

## 2021-01-05 NOTE — ED Notes (Signed)
PTAR present to take pt back to [facility]

## 2021-01-05 NOTE — ED Notes (Signed)
MD replaced pts G tube with 18 french G tube. Pt tolerated well. Site is clean and intact. This RN flushed with sterile water. No problems with flushing.

## 2021-01-05 NOTE — ED Notes (Addendum)
This Rn called [facility] to inform them that pt is returning. This RN also his wife, and informed her that pt is returning to facility. PTAR has been called.

## 2021-01-05 NOTE — ED Provider Notes (Signed)
[facility] EMERGENCY DEPARTMENT Provider Note   CSN: [id number] Arrival date & time: 01/05/21  1012     History No chief complaint on file.   Brent [name]. is a 75 y.o. male.  G-tube loss Patient reports that his G-tube was placed in August 2021 And sometime overnight or in the morning, his G-tube came out and was "hanging there" He states that the staff at [facility] removed it this morning prior to sendig the patient here He has otherwise been in his normal state of health since being discharged from the hospital on 4/9 following admission for urosepsis Eats some by mouth, by otherwise still using G tube Also has colostomy and chronic indwelling foley         Past Medical History:  Diagnosis Date  . Allergy   . CAD (coronary artery disease)    a. angioplasty of his RCA in 1990. b. bare metal stent placed in the RCA in 2000 followed by rotational atherectomy shortly after for stent restenosis. c. last cath was in 2012 showing stable moderate diffuse CAD. (70% mid LAD, 80% diagonal, 70% Ramus, 40% mid to distal RCA stent restenosis). d. Low risk nuc in 2015.  . Diabetes mellitus   . Diverticulosis   . Elevated CK   . Erectile dysfunction   . Hemorrhoids   . HTN (hypertension)   . Hyperlipidemia   . Hypertriglyceridemia   . Malignant melanoma of left side of neck ([place]) 10/25/2018  . Myocardial infarction ([place])   . Obesity   . OSA (obstructive sleep apnea)   . Persistent disorder of initiating or maintaining sleep   . Personal history of colonic polyps 02/05/2003    Patient Active Problem List   Diagnosis Date Noted  . Severe sepsis ([place]) 12/22/2020  . Sacral decubitus ulcer 12/22/2020  . Nausea 12/22/2020  . UTI (urinary tract infection) 11/24/2020  . Hemorrhagic cystitis   . Encounter for monitoring immunomodulating therapy 08/20/2020  . High risk medication use 08/20/2020  . Psoas abscess, right ([place]) 06/05/2020  . Gram-negative  bacteremia 06/05/2020  . Malnutrition of moderate degree 06/04/2020  . Lumbar discitis 06/02/2020  . Palliative care by specialist   . Goals of care, counseling/discussion   . FTT (failure to thrive) in adult 06/01/2020  . Abdominal pain   . Sacral ulcer, with necrosis of muscle ([place]) 04/09/2020  . 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 Transverse myelitis ([place])   . Labile blood pressure   . Labile blood glucose   . Neurogenic bladder   . Neurogenic bowel   . Acute blood loss anemia   . Transaminitis   . Controlled type 2 diabetes mellitus with hyperglycemia, with long-term current use of insulin ([place])   . Dyslipidemia   . HCAP (healthcare-associated pneumonia)   . Paraplegia ([place])   . Coronary artery disease involving native coronary artery of native heart without angina pectoris   . Steroid-induced hyperglycemia   . Diabetic peripheral neuropathy ([place])   . Hyponatremia   . AKI (acute kidney injury) ([place])   . Weakness 03/09/2020  . Chest pain 03/08/2020  . Right leg numbness   . Malignant melanoma of left side of neck ([place]) 10/25/2018  . Facial neuritis 10/25/2018  . Myofascial pain syndrome 10/25/2018  . Occipital neuralgia of left side 10/25/2018  . CAP (community acquired pneumonia) 09/08/2017  . Male hypogonadism 06/20/2016  . Renal lesion 06/20/2016  . Insomnia 08/07/2015  . Enlarged prostate with lower urinary tract  symptoms (LUTS) 11/07/2014  . Balanitis 07/30/2012  . Bladder neck obstruction 07/30/2012  . Prostate nodule 06/18/2012  . Recurrent nephrolithiasis 06/18/2012  . Benign neoplasm of colon 08/29/2011  . DEPRESSION, SITUATIONAL, ACUTE 01/23/2010  . Essential hypertension 05/30/2009  . Coronary atherosclerosis 05/30/2009  . Obstructive sleep apnea 11/28/2008  . OBESITY 09/23/2008  . ERECTILE DYSFUNCTION 11/26/2007  . Well controlled type 2 diabetes mellitus with peripheral circulatory disorder ([place]) 05/09/2007  . Hyperlipidemia 05/09/2007  . MYOCARDIAL  INFARCTION, HX OF 05/09/2007  . DIVERTICULOSIS, COLON 05/09/2007    Past Surgical History:  Procedure Laterality Date  . CHOLECYSTECTOMY    . CORONARY ANGIOPLASTY    . CORONARY STENT PLACEMENT     stenting of the right coronary artery with followup rotational  atherectomy. (3 stents placed)  . FINGER SURGERY     right  . FOOT SURGERY     right  . INGUINAL HERNIA REPAIR     right  . IRRIGATION AND DEBRIDEMENT ABSCESS N/A 06/04/2020   Procedure: IRRIGATION AND DEBRIDEMENT SACRAL WOUND;  Surgeon: Jesusita Oka, MD;  Location: [facility];  Service: General;  Laterality: N/A;  . LAPAROSCOPY N/A 06/04/2020   Procedure: LAPAROSCOPY ASSISTED COLOSTOMY, LAPAROSCOPY GASTROSTOMY;  Surgeon: Jesusita Oka, MD;  Location: [facility];  Service: General;  Laterality: N/A;  . LYSIS OF ADHESION N/A 06/04/2020   Procedure: LYSIS OF ADHESION;  Surgeon: Jesusita Oka, MD;  Location: [facility];  Service: General;  Laterality: N/A;  . melanoma removal     neck  . ORTHOPEDIC SURGERY     foot right  . rotator cuff surg     Bil       Family History  Problem Relation Age of Onset  . Liver cancer Father   . Lung cancer Father   . Heart disease Father   . Heart disease Sister   . Lung cancer Mother   . COPD Mother   . Obesity Mother   . Colon cancer Neg Hx   . Stomach cancer Neg Hx   . Pancreatic cancer Neg Hx     Social History   Tobacco Use  . Smoking status: Former Smoker    Packs/day: 1.50    Years: 30.00    Pack years: 45.00    Types: Cigarettes    Quit date: 09/19/1980    Years since quitting: 40.3  . Smokeless tobacco: Never Used  Vaping Use  . Vaping Use: Never used  Substance Use Topics  . Alcohol use: Not Currently    Alcohol/week: 7.0 - 14.0 standard drinks    Types: 7 - 14 Shots of liquor per week  . Drug use: No    Home Medications Prior to Admission medications   Medication Sig Start Date End Date Taking? Authorizing Provider  acetaminophen (TYLENOL) 325 MG tablet Take 2  tablets (650 mg total) by mouth every 6 (six) hours as needed for headache. 04/24/20   Love, Ivan Anchors, PA-C  albuterol (PROVENTIL) (2.5 MG/3ML) 0.083% nebulizer solution Take 3 mLs (2.5 mg total) by nebulization once as needed for wheezing or shortness of breath (or anaphylaxis due to Rituxan infusion). Patient taking differently: Take 2.5 mg by nebulization every 6 (six) hours as needed for wheezing or shortness of breath (or anaphylaxis due to Rituxan infusion). 04/24/20   Love, Ivan Anchors, PA-C  antiseptic oral rinse (BIOTENE) LIQD 15 mLs by Mouth Rinse route 2 (two) times daily. Swish & Spit    [provider]  Ascorbic Acid (  VITA-C PO) Take 500 mg by mouth daily.    [provider]  aspirin EC 81 MG tablet Take 81 mg by mouth at bedtime. Swallow whole.    [provider]  atorvastatin (LIPITOR) 20 MG tablet Take 1 tablet (20 mg total) by mouth daily. 03/26/20   Shelly Coss, MD  collagenase (SANTYL) ointment Apply topically daily. Patient taking differently: Apply 1 application topically daily. 11/23/20   Dana Allan I, MD  ferrous gluconate (FERGON) 324 MG tablet Take 324 mg by mouth daily.    [provider]  fluticasone (FLONASE) 50 MCG/ACT nasal spray Place 2 sprays into both nostrils 2 (two) times daily. 04/24/20   Love, Ivan Anchors, PA-C  gabapentin (NEURONTIN) 300 MG capsule Place 1 capsule (300 mg total) into feeding tube at bedtime. Patient taking differently: Take 300 mg by mouth at bedtime. 06/10/20   Rai, Ripudeep K, MD  hydrogen peroxide ([employer]S PEROXIDE SORE MOUTH CLEANS) 1.5 % SOLN Apply 10 mLs topically 2 times daily at 12 noon and 4 pm. 10 mL, mucous membrane, twice a day, rinse and spit    [provider]  Icosapent Ethyl (VASCEPA) 1 g CAPS Take 2 capsules (2 g total) by mouth 2 (two) times daily. 07/19/19   Dunn, Lisbeth Renshaw N, PA-C  insulin aspart (NOVOLOG) 100 UNIT/ML injection Inject 0-15 Units into the skin every 4 (four) hours. Sliding scale   CBG 70 - 120: 0 units: CBG 121 - 150: 2 units; CBG 151 - 200: 3 units; CBG 201 - 250: 5 units; CBG 251 - 300: 8 units;CBG 301 - 350: 11 units; CBG 351 - 400: 15 units; CBG > 400 : 15 units and notify MD Patient taking differently: Inject 0-15 Units into the skin 2 (two) times daily. Sliding scale  CBG 70 - 200: 0 units: CBG 201 - 250: 2 units; CBG 251 - 300: 4 units; CBG 301 - 350: 6 units; CBG 351 - 400: 8 units;CBG 401 - 450: 10 units; CBG 451 - 700: 15units and notify MD 06/10/20   Rai, Ripudeep K, MD  insulin glargine (LANTUS) 100 unit/mL SOPN Inject 15 Units into the skin at bedtime. Patient taking differently: Inject 18 Units into the skin at bedtime. 06/10/20   Rai, Vernelle Emerald, MD  lactulose (CHRONULAC) 10 GM/15ML solution Take 20 g by mouth daily.    [provider]  Lidocaine 4 % PTCH Apply 1 patch topically 2 (two) times daily. Apply to right hip in the morning remove at night    [provider]  lisinopril (ZESTRIL) 10 MG tablet Take 10 mg by mouth daily. 11/03/20   [provider]  magnesium hydroxide (MILK OF MAGNESIA) 400 MG/5ML suspension Take 30 mLs by mouth daily as needed for mild constipation.    [provider]  Menthol (ICY HOT ADVANCED RELIEF) 7.5 % PTCH Apply 1 patch topically daily.    [provider]  Menthol, Topical Analgesic, (BIOFREEZE) 4 % GEL Apply 1 application topically in the morning, at noon, and at bedtime.     [provider]  metFORMIN (GLUCOPHAGE) 1000 MG tablet Take 1,000 mg by mouth 2 (two) times daily with a meal.    [provider]  nutrition supplement, JUVEN, (JUVEN) PACK Place 1 packet into feeding tube 2 (two) times daily between meals. 12/26/20 01/25/21  Little Ishikawa, MD  Nutritional Supplements (FEEDING SUPPLEMENT, PROSOURCE TF,) liquid Place 45 mLs into feeding tube daily. 12/26/20 01/25/21  Little Ishikawa, MD  ondansetron (ZOFRAN-ODT) 8 MG disintegrating tablet Take 8 mg by mouth daily as  needed for nausea or vomiting.    [provider]  pantoprazole (PROTONIX) 40 MG tablet Take 40 mg by mouth daily. 10/19/20   [provider]  polyvinyl alcohol (LIQUIFILM TEARS) 1.4 % ophthalmic solution Place 2 drops into both eyes 2 (two) times daily as needed for dry eyes.    [provider]  PSYLLIUM PO Take 0.4 g by mouth daily.    [provider]  senna-docusate (SENOKOT-S) 8.6-50 MG tablet Take 2 tablets by mouth daily.    [provider]  sertraline (ZOLOFT) 25 MG tablet Take 25 mg by mouth daily. Give with 50mg  dose 11/10/20   [provider]  sertraline (ZOLOFT) 50 MG tablet Take 50 mg by mouth daily. Give with 25mg  dose 11/10/20   [provider]  tamsulosin (FLOMAX) 0.4 MG CAPS capsule Take 1 capsule (0.4 mg total) by mouth daily after supper. 06/10/20   Rai, Vernelle Emerald, MD  traZODone (DESYREL) 50 MG tablet Take 50 mg by mouth at bedtime.    [provider]  vitamin B-12 (CYANOCOBALAMIN) 500 MCG tablet Take 500 mcg by mouth daily.    [provider]  Water For Irrigation, Sterile (FREE WATER) SOLN Place 150 mLs into feeding tube 4 (four) times daily. 12/26/20 01/25/21  Little Ishikawa, MD  zinc sulfate 220 (50 Zn) MG capsule Place 1 capsule (220 mg total) into feeding tube daily. Patient taking differently: Take 220 mg by mouth daily. 06/10/20   Rai, Vernelle Emerald, MD    Allergies    Levofloxacin and Penicillins  Review of Systems   Review of Systems  Constitutional: Negative for fever.  HENT: Negative for congestion.   Respiratory: Negative for shortness of breath.   Cardiovascular: Negative for chest pain.  Gastrointestinal: Negative for abdominal pain.       Loss of G tube  Genitourinary:       Chronic foley  Musculoskeletal:       BLE paraplegia with intact sensation    Physical Exam Updated Vital Signs BP (!) 127/50   Pulse 70   Temp 98.1 F (36.7 C) (Oral)   Resp (!) 24   Ht 6' (1.829  m)   Wt 83.9 kg   SpO2 97%   BMI 25.09 kg/m   Physical Exam Constitutional:      General: He is not in acute distress.    Appearance: He is ill-appearing (chronically).  HENT:     Head: Normocephalic and atraumatic.  Cardiovascular:     Rate and Rhythm: Normal rate and regular rhythm.  Pulmonary:     Effort: Pulmonary effort is normal.     Breath sounds: Normal breath sounds.  Abdominal:     General: Abdomen is flat.     Palpations: Abdomen is soft.     Comments: Bandage over G tube location, mild surrounding erythema with granulation tissue at site, clear mucus  Musculoskeletal:     Comments: BLE paraplegia  Skin:    Coloration: Skin is pale.  Neurological:     Mental Status: He is alert and oriented to person, place, and time.  Psychiatric:     Comments: flat affect     ED Results / Procedures / Treatments   Labs (all labs ordered are listed, but only abnormal results are displayed) Labs Reviewed - No data to display  EKG None  Radiology No results found.  Procedures Gastrostomy tube replacement  Date/Time: 01/05/2021 11:48 AM Performed by: Cleophas Dunker, DO Authorized by: Lucrezia Starch, MD  Consent: Verbal consent obtained. Consent given by: patient Patient identity confirmed: verbally with patient and arm band Local anesthesia used: no  Anesthesia: Local anesthesia used: no  Sedation: Patient sedated: no  Patient tolerance: patient tolerated the procedure well with no immediate complications Comments: Skin cleansed with iodine, 18 French placed without complication, balloon inflated to 6cc with saline.        Medications Ordered in ED Medications - No data to display  ED Course  I have reviewed the triage vital signs and the nursing notes.  Pertinent labs & imaging results that were available during my care of the patient were reviewed by me and considered in my medical decision making (see chart for details).    MDM  Rules/Calculators/A&P                          Patient is a 75 yo male with PMH paraplegia 2/2 transverse myelitis who presents with after G tube removal this AM.  Tract is patent.  Patient consents to attempted replacement at bedside. He tolerated procedure well per above.  18 for placed without complication, no need for imaging given easy placement  And proper functioning.  Patient's vitals remained stable.  Ensured appropriate functioning of G-tube prior to discharge.  Patient was discharged home to [facility] in stable condition.  Patient advised to follow up in 1 week with general surgery to ensure continues to function well.     Final Clinical Impression(s) / ED Diagnoses Final diagnoses:  Gastrostomy tube dysfunction [facility])    Rx / DC Orders ED Discharge Orders    None       Cleophas Dunker, DO 01/05/21 1202    Lucrezia Starch, MD 01/06/21 1510

## 2021-01-07 ENCOUNTER — Telehealth: Payer: Self-pay | Admitting: *Deleted

## 2021-01-07 DIAGNOSIS — E782 Mixed hyperlipidemia: Secondary | ICD-10-CM | POA: Diagnosis not present

## 2021-01-07 DIAGNOSIS — G8250 Quadriplegia, unspecified: Secondary | ICD-10-CM | POA: Diagnosis not present

## 2021-01-07 DIAGNOSIS — G4733 Obstructive sleep apnea (adult) (pediatric): Secondary | ICD-10-CM | POA: Diagnosis not present

## 2021-01-07 DIAGNOSIS — I10 Essential (primary) hypertension: Secondary | ICD-10-CM | POA: Diagnosis not present

## 2021-01-07 DIAGNOSIS — G373 Acute transverse myelitis in demyelinating disease of central nervous system: Secondary | ICD-10-CM | POA: Diagnosis not present

## 2021-01-07 DIAGNOSIS — Z7409 Other reduced mobility: Secondary | ICD-10-CM | POA: Diagnosis not present

## 2021-01-07 DIAGNOSIS — N390 Urinary tract infection, site not specified: Secondary | ICD-10-CM | POA: Diagnosis not present

## 2021-01-07 DIAGNOSIS — E119 Type 2 diabetes mellitus without complications: Secondary | ICD-10-CM | POA: Diagnosis not present

## 2021-01-07 DIAGNOSIS — K592 Neurogenic bowel, not elsewhere classified: Secondary | ICD-10-CM | POA: Diagnosis not present

## 2021-01-07 DIAGNOSIS — N319 Neuromuscular dysfunction of bladder, unspecified: Secondary | ICD-10-CM | POA: Diagnosis not present

## 2021-01-07 DIAGNOSIS — I2510 Atherosclerotic heart disease of native coronary artery without angina pectoris: Secondary | ICD-10-CM | POA: Diagnosis not present

## 2021-01-07 DIAGNOSIS — E1142 Type 2 diabetes mellitus with diabetic polyneuropathy: Secondary | ICD-10-CM | POA: Diagnosis not present

## 2021-01-07 NOTE — Telephone Encounter (Addendum)
TOC CM received call from Kamiah # [phone] at [facility]. Stating pt came for a Gtube change but it was replaced with a smaller tube than used by facility. States his tube is leaking around the site. States she is requesting a 20 French tube and facility will change.TOC CM contacted ED provider that saw pt Dr Roslynn Amble for orders to provide 20 french g-tube and recommended SNF follow up with [facility], notified oncall Dr Zenia Resides and states she will follow up with office for pt to be seen in clinic. Ed updated Ms. Chrissie Noa SNF DON and made aware. Provided 18 in kangaroo feeding bag so may administer his feedings. States they only have supply for 20 F g-tube. [place], [place] ED TOC CM [phone]

## 2021-01-08 DIAGNOSIS — Z931 Gastrostomy status: Secondary | ICD-10-CM | POA: Diagnosis not present

## 2021-01-08 DIAGNOSIS — E118 Type 2 diabetes mellitus with unspecified complications: Secondary | ICD-10-CM | POA: Diagnosis not present

## 2021-01-08 DIAGNOSIS — R682 Dry mouth, unspecified: Secondary | ICD-10-CM | POA: Diagnosis not present

## 2021-01-08 DIAGNOSIS — K9420 Gastrostomy complication, unspecified: Secondary | ICD-10-CM | POA: Diagnosis not present

## 2021-01-11 DIAGNOSIS — I2510 Atherosclerotic heart disease of native coronary artery without angina pectoris: Secondary | ICD-10-CM | POA: Diagnosis not present

## 2021-01-11 DIAGNOSIS — G4733 Obstructive sleep apnea (adult) (pediatric): Secondary | ICD-10-CM | POA: Diagnosis not present

## 2021-01-11 DIAGNOSIS — G8250 Quadriplegia, unspecified: Secondary | ICD-10-CM | POA: Diagnosis not present

## 2021-01-11 DIAGNOSIS — N390 Urinary tract infection, site not specified: Secondary | ICD-10-CM | POA: Diagnosis not present

## 2021-01-11 DIAGNOSIS — I10 Essential (primary) hypertension: Secondary | ICD-10-CM | POA: Diagnosis not present

## 2021-01-11 DIAGNOSIS — E782 Mixed hyperlipidemia: Secondary | ICD-10-CM | POA: Diagnosis not present

## 2021-01-11 DIAGNOSIS — N319 Neuromuscular dysfunction of bladder, unspecified: Secondary | ICD-10-CM | POA: Diagnosis not present

## 2021-01-11 DIAGNOSIS — E1142 Type 2 diabetes mellitus with diabetic polyneuropathy: Secondary | ICD-10-CM | POA: Diagnosis not present

## 2021-01-11 DIAGNOSIS — E119 Type 2 diabetes mellitus without complications: Secondary | ICD-10-CM | POA: Diagnosis not present

## 2021-01-11 DIAGNOSIS — K592 Neurogenic bowel, not elsewhere classified: Secondary | ICD-10-CM | POA: Diagnosis not present

## 2021-01-11 DIAGNOSIS — G373 Acute transverse myelitis in demyelinating disease of central nervous system: Secondary | ICD-10-CM | POA: Diagnosis not present

## 2021-01-11 DIAGNOSIS — Z7409 Other reduced mobility: Secondary | ICD-10-CM | POA: Diagnosis not present

## 2021-01-12 DIAGNOSIS — L89154 Pressure ulcer of sacral region, stage 4: Secondary | ICD-10-CM | POA: Diagnosis not present

## 2021-01-12 DIAGNOSIS — N401 Enlarged prostate with lower urinary tract symptoms: Secondary | ICD-10-CM | POA: Diagnosis not present

## 2021-01-12 DIAGNOSIS — N319 Neuromuscular dysfunction of bladder, unspecified: Secondary | ICD-10-CM | POA: Diagnosis not present

## 2021-01-12 DIAGNOSIS — L89210 Pressure ulcer of right hip, unstageable: Secondary | ICD-10-CM | POA: Diagnosis not present

## 2021-01-12 DIAGNOSIS — L89620 Pressure ulcer of left heel, unstageable: Secondary | ICD-10-CM | POA: Diagnosis not present

## 2021-01-12 DIAGNOSIS — L89613 Pressure ulcer of right heel, stage 3: Secondary | ICD-10-CM | POA: Diagnosis not present

## 2021-01-12 DIAGNOSIS — Z978 Presence of other specified devices: Secondary | ICD-10-CM | POA: Diagnosis not present

## 2021-01-12 DIAGNOSIS — L89890 Pressure ulcer of other site, unstageable: Secondary | ICD-10-CM | POA: Diagnosis not present

## 2021-01-12 DIAGNOSIS — N390 Urinary tract infection, site not specified: Secondary | ICD-10-CM | POA: Diagnosis not present

## 2021-01-12 DIAGNOSIS — N138 Other obstructive and reflux uropathy: Secondary | ICD-10-CM | POA: Diagnosis not present

## 2021-01-12 DIAGNOSIS — L89510 Pressure ulcer of right ankle, unstageable: Secondary | ICD-10-CM | POA: Diagnosis not present

## 2021-01-13 DIAGNOSIS — R631 Polydipsia: Secondary | ICD-10-CM | POA: Diagnosis not present

## 2021-01-13 DIAGNOSIS — R682 Dry mouth, unspecified: Secondary | ICD-10-CM | POA: Diagnosis not present

## 2021-01-13 DIAGNOSIS — B001 Herpesviral vesicular dermatitis: Secondary | ICD-10-CM | POA: Diagnosis not present

## 2021-01-13 DIAGNOSIS — E1165 Type 2 diabetes mellitus with hyperglycemia: Secondary | ICD-10-CM | POA: Diagnosis not present

## 2021-01-14 DIAGNOSIS — K592 Neurogenic bowel, not elsewhere classified: Secondary | ICD-10-CM | POA: Diagnosis not present

## 2021-01-14 DIAGNOSIS — Z7409 Other reduced mobility: Secondary | ICD-10-CM | POA: Diagnosis not present

## 2021-01-14 DIAGNOSIS — N390 Urinary tract infection, site not specified: Secondary | ICD-10-CM | POA: Diagnosis not present

## 2021-01-14 DIAGNOSIS — G8250 Quadriplegia, unspecified: Secondary | ICD-10-CM | POA: Diagnosis not present

## 2021-01-14 DIAGNOSIS — N319 Neuromuscular dysfunction of bladder, unspecified: Secondary | ICD-10-CM | POA: Diagnosis not present

## 2021-01-14 DIAGNOSIS — E782 Mixed hyperlipidemia: Secondary | ICD-10-CM | POA: Diagnosis not present

## 2021-01-14 DIAGNOSIS — G373 Acute transverse myelitis in demyelinating disease of central nervous system: Secondary | ICD-10-CM | POA: Diagnosis not present

## 2021-01-14 DIAGNOSIS — E119 Type 2 diabetes mellitus without complications: Secondary | ICD-10-CM | POA: Diagnosis not present

## 2021-01-14 DIAGNOSIS — I2510 Atherosclerotic heart disease of native coronary artery without angina pectoris: Secondary | ICD-10-CM | POA: Diagnosis not present

## 2021-01-14 DIAGNOSIS — I10 Essential (primary) hypertension: Secondary | ICD-10-CM | POA: Diagnosis not present

## 2021-01-14 DIAGNOSIS — G4733 Obstructive sleep apnea (adult) (pediatric): Secondary | ICD-10-CM | POA: Diagnosis not present

## 2021-01-14 DIAGNOSIS — E1142 Type 2 diabetes mellitus with diabetic polyneuropathy: Secondary | ICD-10-CM | POA: Diagnosis not present

## 2021-01-15 DIAGNOSIS — R5383 Other fatigue: Secondary | ICD-10-CM | POA: Diagnosis not present

## 2021-01-15 DIAGNOSIS — R194 Change in bowel habit: Secondary | ICD-10-CM | POA: Diagnosis not present

## 2021-01-15 DIAGNOSIS — R5381 Other malaise: Secondary | ICD-10-CM | POA: Diagnosis not present

## 2021-01-15 DIAGNOSIS — R110 Nausea: Secondary | ICD-10-CM | POA: Diagnosis not present

## 2021-01-18 DIAGNOSIS — R5383 Other fatigue: Secondary | ICD-10-CM | POA: Diagnosis not present

## 2021-01-18 DIAGNOSIS — E1165 Type 2 diabetes mellitus with hyperglycemia: Secondary | ICD-10-CM | POA: Diagnosis not present

## 2021-01-18 DIAGNOSIS — R442 Other hallucinations: Secondary | ICD-10-CM | POA: Diagnosis not present

## 2021-01-18 DIAGNOSIS — R634 Abnormal weight loss: Secondary | ICD-10-CM | POA: Diagnosis not present

## 2021-01-19 DIAGNOSIS — R630 Anorexia: Secondary | ICD-10-CM | POA: Diagnosis not present

## 2021-01-19 DIAGNOSIS — L89210 Pressure ulcer of right hip, unstageable: Secondary | ICD-10-CM | POA: Diagnosis not present

## 2021-01-19 DIAGNOSIS — L89154 Pressure ulcer of sacral region, stage 4: Secondary | ICD-10-CM | POA: Diagnosis not present

## 2021-01-19 DIAGNOSIS — L89510 Pressure ulcer of right ankle, unstageable: Secondary | ICD-10-CM | POA: Diagnosis not present

## 2021-01-19 DIAGNOSIS — N318 Other neuromuscular dysfunction of bladder: Secondary | ICD-10-CM | POA: Diagnosis not present

## 2021-01-19 DIAGNOSIS — L89620 Pressure ulcer of left heel, unstageable: Secondary | ICD-10-CM | POA: Diagnosis not present

## 2021-01-19 DIAGNOSIS — E118 Type 2 diabetes mellitus with unspecified complications: Secondary | ICD-10-CM | POA: Diagnosis not present

## 2021-01-19 DIAGNOSIS — G373 Acute transverse myelitis in demyelinating disease of central nervous system: Secondary | ICD-10-CM | POA: Diagnosis not present

## 2021-01-19 DIAGNOSIS — K219 Gastro-esophageal reflux disease without esophagitis: Secondary | ICD-10-CM | POA: Diagnosis not present

## 2021-01-19 DIAGNOSIS — L89613 Pressure ulcer of right heel, stage 3: Secondary | ICD-10-CM | POA: Diagnosis not present

## 2021-01-19 DIAGNOSIS — I10 Essential (primary) hypertension: Secondary | ICD-10-CM | POA: Diagnosis not present

## 2021-01-19 DIAGNOSIS — L89890 Pressure ulcer of other site, unstageable: Secondary | ICD-10-CM | POA: Diagnosis not present

## 2021-01-19 DIAGNOSIS — D6489 Other specified anemias: Secondary | ICD-10-CM | POA: Diagnosis not present

## 2021-01-19 DIAGNOSIS — Z933 Colostomy status: Secondary | ICD-10-CM | POA: Diagnosis not present

## 2021-01-19 DIAGNOSIS — Z931 Gastrostomy status: Secondary | ICD-10-CM | POA: Diagnosis not present

## 2021-01-19 DIAGNOSIS — G8220 Paraplegia, unspecified: Secondary | ICD-10-CM | POA: Diagnosis not present

## 2021-01-26 DIAGNOSIS — L89620 Pressure ulcer of left heel, unstageable: Secondary | ICD-10-CM | POA: Diagnosis not present

## 2021-01-26 DIAGNOSIS — R443 Hallucinations, unspecified: Secondary | ICD-10-CM | POA: Diagnosis not present

## 2021-01-26 DIAGNOSIS — L89154 Pressure ulcer of sacral region, stage 4: Secondary | ICD-10-CM | POA: Diagnosis not present

## 2021-01-26 DIAGNOSIS — L89890 Pressure ulcer of other site, unstageable: Secondary | ICD-10-CM | POA: Diagnosis not present

## 2021-01-26 DIAGNOSIS — L89210 Pressure ulcer of right hip, unstageable: Secondary | ICD-10-CM | POA: Diagnosis not present

## 2021-01-26 DIAGNOSIS — E1165 Type 2 diabetes mellitus with hyperglycemia: Secondary | ICD-10-CM | POA: Diagnosis not present

## 2021-01-26 DIAGNOSIS — E43 Unspecified severe protein-calorie malnutrition: Secondary | ICD-10-CM | POA: Diagnosis not present

## 2021-01-26 DIAGNOSIS — R631 Polydipsia: Secondary | ICD-10-CM | POA: Diagnosis not present

## 2021-01-27 ENCOUNTER — Telehealth: Payer: Medicare Other

## 2021-01-28 ENCOUNTER — Telehealth: Payer: Self-pay | Admitting: Pharmacist

## 2021-01-28 NOTE — Chronic Care Management (AMB) (Signed)
Chronic Care Management Pharmacy Assistant   Name: Brent [name].  MRN: [id number] DOB: 1945-10-22  Reason for Encounter: Disease State/ General Assessment Call.    Conditions to be addressed/monitored: HTN, HLD and DMII  Recent office visits:  None.   Recent consult visits:  12/09/20 Michel Bickers MD (Infectious Disease) - acute cystitis. No medication changes and no follow ups noted.   11/11/20 Magnus Sinning (Wound Clinic)  - no medication changes and follow up in 3 weeks.   10/22/20 Dia Crawford (Gerontology) - no medication changes and no follow up noted.   10/16/20 [name] (Gerontology) - no medication changes or follow up noted.   10/14/20 Magnus Sinning (Wound Clinic)  - no medication changes and follow up in 3 weeks.   10/13/20 [name] (Gerontology) - no medication changes or follow up noted.   10/06/20 [name] (Gerontology) - no medication changes or follow up noted.   09/28/20 Lenell Antu MD (Wound Clinic) - no medication changes and follow up in 1 week with Dr. Jaclynn Guarneri.  09/18/20 [name] (Gerontology) - no medication changes or follow up noted.   09/17/20 Twanna Hy (Family Medicine) - no medication changes or follow up noted.   09/16/20 [name] (Gerontology) - no medication changes or follow up noted.   09/15/20 Arlice Colt ( Neurology) - no medication changes or follow up noted.   09/07/20 Lenell Antu MD (Wound Clinic) - no medication changes and follow up in 2-3 weeks.    Hospital visits:   Medication Reconciliation was completed by comparing discharge summary, patient's EMR and Pharmacy list, and upon discussion with patient.  Patient visited [facility] ED on 01/05/21 due to gastrostomy tube dysfunction. Patient was at ER for 5 hours. No admission.   New?Medications Started at [facility] Discharge:?? None.  Medication Changes at Hospital Discharge: None.   Medications Discontinued at Hospital Discharge: None.   Medications  that remain the same after Hospital Discharge:??  -All other medications will remain the same.     Medication Reconciliation was completed by comparing discharge summary, patient's EMR and Pharmacy list, and upon discussion with patient.   Patient was admitted to [facility] on 12/21/20 due to UTI, Sepsis and kidney injury. Patient was discharged on 12/26/20.   New?Medications Started at [facility] Discharge:?? Augmentin 875- one tablet twice daily for 9 days.   Medication Changes at Hospital Discharge: None.   Medications Discontinued at Hospital Discharge: None.   Medications that remain the same after Hospital Discharge:??  -All other medications will remain the same.     Medication Reconciliation was completed by comparing discharge summary, patient's EMR and Pharmacy list, and upon discussion with patient.   Patient was admitted to [facility] on 11/16/20 due to sepsis and other chronic conditions. Patient was discharged on 11/22/20.  New?Medications Started at [facility] Discharge:?? Amoxicillin, collagenase and free water.   Medication Changes at Hospital Discharge: Acetaminophen, biofreeze, nutrition supplement, feeding supplement, pantoprazole.  Medications Discontinued at Hospital Discharge: Diclofenac sodium gel, doxycycline 100mg , feeding supplement pro stat sugar free, meclizine 12.5mg , multivitamin, naproxen 500mg , ondansetron 4mg , florastor and tramadol 50mg .   Medications that remain the same after Hospital Discharge:??  -All other medications will remain the same.     Medication Reconciliation was completed by comparing discharge summary, patient's EMR and Pharmacy list, and upon discussion with patient.    Medications: Outpatient Encounter Medications as of 01/28/2021  Medication Sig Note  . acetaminophen (TYLENOL) 325  MG tablet Take 2 tablets (650 mg total) by mouth every 6 (six) hours as needed for headache.   . albuterol  (PROVENTIL) (2.5 MG/3ML) 0.083% nebulizer solution Take 3 mLs (2.5 mg total) by nebulization once as needed for wheezing or shortness of breath (or anaphylaxis due to Rituxan infusion). (Patient taking differently: Take 2.5 mg by nebulization every 6 (six) hours as needed for wheezing or shortness of breath (or anaphylaxis due to Rituxan infusion).)   . antiseptic oral rinse (BIOTENE) LIQD 15 mLs by Mouth Rinse route 2 (two) times daily. Swish & Spit   . Ascorbic Acid (VITA-C PO) Take 500 mg by mouth daily.   Marland Kitchen aspirin EC 81 MG tablet Take 81 mg by mouth at bedtime. Swallow whole.   Marland Kitchen atorvastatin (LIPITOR) 20 MG tablet Take 1 tablet (20 mg total) by mouth daily.   . collagenase (SANTYL) ointment Apply topically daily. (Patient taking differently: Apply 1 application topically daily.)   . ferrous gluconate (FERGON) 324 MG tablet Take 324 mg by mouth daily.   . fluticasone (FLONASE) 50 MCG/ACT nasal spray Place 2 sprays into both nostrils 2 (two) times daily.   Marland Kitchen gabapentin (NEURONTIN) 300 MG capsule Place 1 capsule (300 mg total) into feeding tube at bedtime. (Patient taking differently: Take 300 mg by mouth at bedtime.)   . hydrogen peroxide ([employer]S PEROXIDE SORE MOUTH CLEANS) 1.5 % SOLN Apply 10 mLs topically 2 times daily at 12 noon and 4 pm. 10 mL, mucous membrane, twice a day, rinse and spit   . Icosapent Ethyl (VASCEPA) 1 g CAPS Take 2 capsules (2 g total) by mouth 2 (two) times daily.   . insulin aspart (NOVOLOG) 100 UNIT/ML injection Inject 0-15 Units into the skin every 4 (four) hours. Sliding scale  CBG 70 - 120: 0 units: CBG 121 - 150: 2 units; CBG 151 - 200: 3 units; CBG 201 - 250: 5 units; CBG 251 - 300: 8 units;CBG 301 - 350: 11 units; CBG 351 - 400: 15 units; CBG > 400 : 15 units and notify MD (Patient taking differently: Inject 0-15 Units into the skin 2 (two) times daily. Sliding scale  CBG 70 - 200: 0 units: CBG 201 - 250: 2 units; CBG 251 - 300: 4 units; CBG 301 - 350: 6 units; CBG 351 -  400: 8 units;CBG 401 - 450: 10 units; CBG 451 - 700: 15units and notify MD)   . insulin glargine (LANTUS) 100 unit/mL SOPN Inject 15 Units into the skin at bedtime. (Patient taking differently: Inject 18 Units into the skin at bedtime.) 12/09/2020: 18 units at bedtime  . lactulose (CHRONULAC) 10 GM/15ML solution Take 20 g by mouth daily.   . Lidocaine 4 % PTCH Apply 1 patch topically 2 (two) times daily. Apply to right hip in the morning remove at night 11/16/2020: Unsure if pt has one on  . lisinopril (ZESTRIL) 10 MG tablet Take 10 mg by mouth daily.   . magnesium hydroxide (MILK OF MAGNESIA) 400 MG/5ML suspension Take 30 mLs by mouth daily as needed for mild constipation.   . Menthol (ICY HOT ADVANCED RELIEF) 7.5 % PTCH Apply 1 patch topically daily.   . Menthol, Topical Analgesic, (BIOFREEZE) 4 % GEL Apply 1 application topically in the morning, at noon, and at bedtime.    . metFORMIN (GLUCOPHAGE) 1000 MG tablet Take 1,000 mg by mouth 2 (two) times daily with a meal.   . ondansetron (ZOFRAN-ODT) 8 MG disintegrating tablet Take 8 mg by  mouth daily as needed for nausea or vomiting.   . pantoprazole (PROTONIX) 40 MG tablet Take 40 mg by mouth daily.   . polyvinyl alcohol (LIQUIFILM TEARS) 1.4 % ophthalmic solution Place 2 drops into both eyes 2 (two) times daily as needed for dry eyes.   . PSYLLIUM PO Take 0.4 g by mouth daily.   Marland Kitchen senna-docusate (SENOKOT-S) 8.6-50 MG tablet Take 2 tablets by mouth daily.   . sertraline (ZOLOFT) 25 MG tablet Take 25 mg by mouth daily. Give with 50mg  dose   . sertraline (ZOLOFT) 50 MG tablet Take 50 mg by mouth daily. Give with 25mg  dose   . tamsulosin (FLOMAX) 0.4 MG CAPS capsule Take 1 capsule (0.4 mg total) by mouth daily after supper.   . traZODone (DESYREL) 50 MG tablet Take 50 mg by mouth at bedtime.   . vitamin B-12 (CYANOCOBALAMIN) 500 MCG tablet Take 500 mcg by mouth daily.   Marland Kitchen zinc sulfate 220 (50 Zn) MG capsule Place 1 capsule (220 mg total) into feeding  tube daily. (Patient taking differently: Take 220 mg by mouth daily.)    No facility-administered encounter medications on file as of 01/28/2021.    Recent Relevant Labs: Lab Results  Component Value Date/Time   HGBA1C 8.2 (H) 11/16/2020 11:23 AM   HGBA1C 6.6 (H) 06/01/2020 12:48 PM   MICROALBUR 4.2 (H) 03/18/2015 08:33 AM   MICROALBUR 1.7 01/09/2013 09:49 AM    Kidney Function Lab Results  Component Value Date/Time   CREATININE 0.57 (L) 12/26/2020 03:56 AM   CREATININE 0.47 (L) 12/25/2020 03:59 AM   CREATININE 0.26 (L) 07/23/2020 10:52 AM   CREATININE 0.87 04/15/2016 11:33 AM   GFR 82.18 03/18/2015 08:33 AM   GFRNONAA >60 12/26/2020 03:56 AM   GFRNONAA 87 04/15/2016 11:33 AM   GFRAA >60 06/10/2020 04:31 AM   GFRAA >89 04/15/2016 11:33 AM    . Current antihyperglycemic regimen:    Insulin aspart (Novolog Flexpen), inject 0-15 units three times daily with meals and sliding scale. If more than 15 notify Doctor.  Insulin glargine (Lantus), inject 15 units at bedtime. Patient reports is injecting 18 units at bedtime.   Metformin 1000mg , 1 tablet twice daily with a meal   . What recent interventions/DTPs have been made to improve glycemic control:  o Jardiance removed from patients medications.  . Have there been any recent hospitalizations or ED visits since last visit with CPP? Yes  . Patient hypoglycemic symptoms, including   . Patient  hyperglycemic symptoms, including   . How often are you checking your blood sugar?   . What are your blood sugars ranging?  o Fasting:  o Before meals: o After meals: o Bedtime:   . During the week, how often does your blood glucose drop below 70?  Marland Kitchen Are you checking your feet daily/regularly?   Adherence Review: Is the patient currently on a STATIN medication? Yes Is the patient currently on ACE/ARB medication? Yes Does the patient have >5 day gap between last estimated fill dates? No   Comprehensive medication review  performed; Spoke to patient regarding cholesterol  Lipid Panel    Component Value Date/Time   CHOL 135 01/20/2020 0754   TRIG 295 (H) 01/20/2020 0754   HDL 23 (L) 01/20/2020 0754   LDLCALC 65 01/20/2020 0754   LDLDIRECT 68 01/20/2020 0754   LDLDIRECT 77.0 04/15/2016 1133    10-year ASCVD risk score: The ASCVD Risk score Mikey Bussing DC Brooke Bonito., et al., 2013) failed to calculate for  the following reasons:   The patient has a prior MI or stroke diagnosis  . Current antihyperlipidemic regimen:    Vascepa 1g, 2 capsules twice daily  Atorvastatin 20mg , 1 tablet once daily   . Previous antihyperlipidemic medications tried: Rosuvastatin.   Marland Kitchen ASCVD risk enhancing conditions: age >39, DM and HTN  . What recent interventions/DTPs have been made by any provider to improve Cholesterol control since last CPP Visit:         Fenofibrate was removed and rosuvastatin replaced with        atorvastatin.   . Any recent hospitalizations or ED visits since last visit with CPP? Yes  . What diet changes have been made to improve Cholesterol?  o   . What exercise is being done to improve Cholesterol?  o   Adherence Review: Does the patient have >5 day gap between last estimated fill dates? No  Spoke with [name] patients wife and she states patient is in a nursing home and has been having multiple issues. They are managing his care and patient would not know or her.   Per Jeni Salles. Unable to bill patient due to being in a nursing home.   Star Rating Drugs:   Atorvastatin 20mg  - last filled on 12/20/20 30DS at [facility].  Metformin 1000mg  - last filled on 01/26/21 30DS at [facility].   Lisinopril 10mg  - last filled on 01/01/21 30DS at [facility].   [facility] [phone]

## 2021-01-29 NOTE — Telephone Encounter (Cosign Needed)
2 attempts.

## 2021-02-01 DIAGNOSIS — F331 Major depressive disorder, recurrent, moderate: Secondary | ICD-10-CM | POA: Diagnosis not present

## 2021-02-01 DIAGNOSIS — F5101 Primary insomnia: Secondary | ICD-10-CM | POA: Diagnosis not present

## 2021-02-01 DIAGNOSIS — F411 Generalized anxiety disorder: Secondary | ICD-10-CM | POA: Diagnosis not present

## 2021-02-01 NOTE — Telephone Encounter (Cosign Needed)
3 attempts.

## 2021-02-02 DIAGNOSIS — L89210 Pressure ulcer of right hip, unstageable: Secondary | ICD-10-CM | POA: Diagnosis not present

## 2021-02-02 DIAGNOSIS — I10 Essential (primary) hypertension: Secondary | ICD-10-CM | POA: Diagnosis not present

## 2021-02-02 DIAGNOSIS — L89890 Pressure ulcer of other site, unstageable: Secondary | ICD-10-CM | POA: Diagnosis not present

## 2021-02-02 DIAGNOSIS — L89620 Pressure ulcer of left heel, unstageable: Secondary | ICD-10-CM | POA: Diagnosis not present

## 2021-02-02 DIAGNOSIS — E785 Hyperlipidemia, unspecified: Secondary | ICD-10-CM | POA: Diagnosis not present

## 2021-02-02 DIAGNOSIS — E118 Type 2 diabetes mellitus with unspecified complications: Secondary | ICD-10-CM | POA: Diagnosis not present

## 2021-02-02 DIAGNOSIS — Z9189 Other specified personal risk factors, not elsewhere classified: Secondary | ICD-10-CM | POA: Diagnosis not present

## 2021-02-02 DIAGNOSIS — N318 Other neuromuscular dysfunction of bladder: Secondary | ICD-10-CM | POA: Diagnosis not present

## 2021-02-02 DIAGNOSIS — L89154 Pressure ulcer of sacral region, stage 4: Secondary | ICD-10-CM | POA: Diagnosis not present

## 2021-02-02 DIAGNOSIS — E538 Deficiency of other specified B group vitamins: Secondary | ICD-10-CM | POA: Diagnosis not present

## 2021-02-02 NOTE — Telephone Encounter (Cosign Needed)
Lvm with wife.

## 2021-02-09 DIAGNOSIS — L89620 Pressure ulcer of left heel, unstageable: Secondary | ICD-10-CM | POA: Diagnosis not present

## 2021-02-09 DIAGNOSIS — L89210 Pressure ulcer of right hip, unstageable: Secondary | ICD-10-CM | POA: Diagnosis not present

## 2021-02-09 DIAGNOSIS — L89890 Pressure ulcer of other site, unstageable: Secondary | ICD-10-CM | POA: Diagnosis not present

## 2021-02-09 DIAGNOSIS — L89154 Pressure ulcer of sacral region, stage 4: Secondary | ICD-10-CM | POA: Diagnosis not present

## 2021-02-10 ENCOUNTER — Other Ambulatory Visit: Payer: Self-pay

## 2021-02-10 ENCOUNTER — Ambulatory Visit (INDEPENDENT_AMBULATORY_CARE_PROVIDER_SITE_OTHER): Payer: Medicare Other | Admitting: Internal Medicine

## 2021-02-10 DIAGNOSIS — L89319 Pressure ulcer of right buttock, unspecified stage: Secondary | ICD-10-CM | POA: Diagnosis not present

## 2021-02-10 NOTE — Progress Notes (Signed)
[facility] for Infectious Disease  Reason for Consult: Possible right ischial wound infection Referring Provider: Ladell Heads NP  Assessment: He may have had some superficial wound infection with MRSA in early May but I do not see any thing to suggest that he has ongoing deep infection that requires further imaging or intravenous antibiotics.  Plan: 1. Complete 14 days of oral doxycycline 2. Follow-up here as needed  Patient Active Problem List   Diagnosis Date Noted  . Right ischial pressure sore 02/10/2021    Priority: High  . Sacral ulcer, with necrosis of muscle ([place]) 04/09/2020    Priority: Medium  . Transverse myelitis ([place])     Priority: Medium  . Paraplegia ([place])     Priority: Medium  . Severe sepsis ([place]) 12/22/2020  . Sacral decubitus ulcer 12/22/2020  . Nausea 12/22/2020  . Encounter for monitoring immunomodulating therapy 08/20/2020  . High risk medication use 08/20/2020  . Psoas abscess, right ([place]) 06/05/2020  . Malnutrition of moderate degree 06/04/2020  . Lumbar discitis 06/02/2020  . Palliative care by specialist   . Goals of care, counseling/discussion   . FTT (failure to thrive) in adult 06/01/2020  . Abdominal pain   . 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 Labile blood pressure   . Labile blood glucose   . Neurogenic bladder   . Neurogenic bowel   . Acute blood loss anemia   . Transaminitis   . Controlled type 2 diabetes mellitus with hyperglycemia, with long-term current use of insulin ([place])   . Dyslipidemia   . HCAP (healthcare-associated pneumonia)   . Coronary artery disease involving native coronary artery of native heart without angina pectoris   . Steroid-induced hyperglycemia   . Diabetic peripheral neuropathy ([place])   . Hyponatremia   . AKI (acute kidney injury) ([place])   . Weakness 03/09/2020  . Chest pain 03/08/2020  . Right leg numbness   . Malignant melanoma of left side of neck ([place]) 10/25/2018  . Facial neuritis  10/25/2018  . Myofascial pain syndrome 10/25/2018  . Occipital neuralgia of left side 10/25/2018  . CAP (community acquired pneumonia) 09/08/2017  . Male hypogonadism 06/20/2016  . Renal lesion 06/20/2016  . Insomnia 08/07/2015  . Enlarged prostate with lower urinary tract symptoms (LUTS) 11/07/2014  . Balanitis 07/30/2012  . Bladder neck obstruction 07/30/2012  . Prostate nodule 06/18/2012  . Recurrent nephrolithiasis 06/18/2012  . Benign neoplasm of colon 08/29/2011  . DEPRESSION, SITUATIONAL, ACUTE 01/23/2010  . Essential hypertension 05/30/2009  . Coronary atherosclerosis 05/30/2009  . Obstructive sleep apnea 11/28/2008  . OBESITY 09/23/2008  . ERECTILE DYSFUNCTION 11/26/2007  . Well controlled type 2 diabetes mellitus with peripheral circulatory disorder ([place]) 05/09/2007  . Hyperlipidemia 05/09/2007  . MYOCARDIAL INFARCTION, HX OF 05/09/2007  . DIVERTICULOSIS, COLON 05/09/2007    Patient's Medications  New Prescriptions   No medications on file  Previous Medications   ACETAMINOPHEN (TYLENOL) 325 MG TABLET    Take 2 tablets (650 mg total) by mouth every 6 (six) hours as needed for headache.   ALBUTEROL (PROVENTIL) (2.5 MG/3ML) 0.083% NEBULIZER SOLUTION    Take 3 mLs (2.5 mg total) by nebulization once as needed for wheezing or shortness of breath (or anaphylaxis due to Rituxan infusion).   ANTISEPTIC ORAL RINSE (BIOTENE) LIQD    15 mLs by Mouth Rinse route 2 (two) times daily. Swish & Spit   ASCORBIC ACID (VITA-C PO)    Take 500 mg by mouth daily.  ASPIRIN EC 81 MG TABLET    Take 81 mg by mouth at bedtime. Swallow whole.   ATORVASTATIN (LIPITOR) 20 MG TABLET    Take 1 tablet (20 mg total) by mouth daily.   COLLAGENASE (SANTYL) OINTMENT    Apply topically daily.   DOXYCYCLINE (VIBRAMYCIN) 100 MG CAPSULE    Take 100 mg by mouth 2 (two) times daily.   FERROUS GLUCONATE (FERGON) 324 MG TABLET    Take 324 mg by mouth daily.   FLUTICASONE (FLONASE) 50 MCG/ACT NASAL SPRAY    Place  2 sprays into both nostrils 2 (two) times daily.   GABAPENTIN (NEURONTIN) 300 MG CAPSULE    Place 1 capsule (300 mg total) into feeding tube at bedtime.   HYDROGEN PEROXIDE (CVS PEROXIDE SORE MOUTH CLEANS) 1.5 % SOLN    Apply 10 mLs topically 2 times daily at 12 noon and 4 pm. 10 mL, mucous membrane, twice a day, rinse and spit   ICOSAPENT ETHYL (VASCEPA) 1 G CAPS    Take 2 capsules (2 g total) by mouth 2 (two) times daily.   INSULIN ASPART (NOVOLOG) 100 UNIT/ML INJECTION    Inject 0-15 Units into the skin every 4 (four) hours. Sliding scale  CBG 70 - 120: 0 units: CBG 121 - 150: 2 units; CBG 151 - 200: 3 units; CBG 201 - 250: 5 units; CBG 251 - 300: 8 units;CBG 301 - 350: 11 units; CBG 351 - 400: 15 units; CBG > 400 : 15 units and notify MD   INSULIN GLARGINE (LANTUS) 100 UNIT/ML SOPN    Inject 15 Units into the skin at bedtime.   LACTULOSE (CHRONULAC) 10 GM/15ML SOLUTION    Take 20 g by mouth daily.   LIDOCAINE 4 % PTCH    Apply 1 patch topically 2 (two) times daily. Apply to right hip in the morning remove at night   LISINOPRIL (ZESTRIL) 20 MG TABLET    Take 1 tablet by mouth daily.   MAGNESIUM HYDROXIDE (MILK OF MAGNESIA) 400 MG/5ML SUSPENSION    Take 30 mLs by mouth daily as needed for mild constipation.   MENTHOL (ICY HOT ADVANCED RELIEF) 7.5 % PTCH    Apply 1 patch topically daily.   MENTHOL, TOPICAL ANALGESIC, (BIOFREEZE) 4 % GEL    Apply 1 application topically in the morning, at noon, and at bedtime.    METFORMIN (GLUCOPHAGE-XR) 500 MG 24 HR TABLET    Take 500 mg by mouth 2 (two) times daily.   ONDANSETRON (ZOFRAN-ODT) 8 MG DISINTEGRATING TABLET    Take 8 mg by mouth daily as needed for nausea or vomiting.   PANTOPRAZOLE (PROTONIX) 40 MG TABLET    Take 40 mg by mouth daily.   POLYVINYL ALCOHOL (LIQUIFILM TEARS) 1.4 % OPHTHALMIC SOLUTION    Place 2 drops into both eyes 2 (two) times daily as needed for dry eyes.   PSYLLIUM PO    Take 0.4 g by mouth daily.   SENNA-DOCUSATE (SENOKOT-S) 8.6-50  MG TABLET    Take 2 tablets by mouth daily.   SERTRALINE (ZOLOFT) 25 MG TABLET    Take by mouth daily. Give with 50mg  dose   SERTRALINE (ZOLOFT) 50 MG TABLET    Take 50 mg by mouth daily. Give with 25mg  dose   TAMSULOSIN (FLOMAX) 0.4 MG CAPS CAPSULE    Take 1 capsule (0.4 mg total) by mouth daily after supper.   TRADJENTA 5 MG TABS TABLET    Take 5 mg by mouth daily.  TRAZODONE (DESYREL) 50 MG TABLET    Take 50 mg by mouth at bedtime.   VITAMIN B-12 (CYANOCOBALAMIN) 500 MCG TABLET    Take 500 mcg by mouth daily.   ZINC SULFATE 220 (50 ZN) MG CAPSULE    Place 1 capsule (220 mg total) into feeding tube daily.  Modified Medications   No medications on file  Discontinued Medications   LISINOPRIL (ZESTRIL) 10 MG TABLET    Take 20 mg by mouth daily.   METFORMIN (GLUCOPHAGE) 1000 MG TABLET    Take 1,000 mg by mouth 2 (two) times daily with a meal.    HPI: Brent [name]. is a 75 y.o. male who developed transverse myelitis and lower extremity paraplegia in July of last year.  He had a lengthy hospital stay before being transferred to a skilled nursing facility.  He had been suffering from very dry mouth and anorexia.  He had dramatic weight loss and has developed a large stage IV sacral pressure ulcer.  He was admitted last September because of failure to thrive. MRI revealed a left gluteal abscess adjacent to his wound and L4-5 osteomyelitis with a right psoas abscess.  Admission blood cultures grew Proteus.  A diverting colostomy was performed.  He was discharged on oral amoxicillin clavulanate and completed 6 weeks of therapy at the end of October.  He has also been treated recently for sepsis and UTI.  He was recently started on methenamine by his urologist.  I have 2 notes from his wound care nurse at [facility].  The note on 01/26/2021 indicates that someone noted some odor from his ischial wound in early May and did a swab culture of the wound which grew MRSA.  The wound care nurse note on  01/26/2021 mention that there were no "signs or symptoms of infection".  He was initially on Keflex but when the culture returned he was started on doxycycline on 02/02/2021.  Wound care nurse note from yesterday described the wound as "pale pink with hypergranular tissue.  No erythema, no swelling, mild induration, nontender to palpation, no odor, no purulent drainage".  Review of Systems: Review of Systems  Constitutional: Negative for fever.  Gastrointestinal: Positive for nausea. Negative for abdominal pain and vomiting.  Neurological:       He is experiencing some vertigo after the ambulance ride to clinic today.      Past Medical History:  Diagnosis Date  . Allergy   . CAD (coronary artery disease)    a. angioplasty of his RCA in 1990. b. bare metal stent placed in the RCA in 2000 followed by rotational atherectomy shortly after for stent restenosis. c. last cath was in 2012 showing stable moderate diffuse CAD. (70% mid LAD, 80% diagonal, 70% Ramus, 40% mid to distal RCA stent restenosis). d. Low risk nuc in 2015.  . Diabetes mellitus   . Diverticulosis   . Elevated CK   . Erectile dysfunction   . Hemorrhoids   . HTN (hypertension)   . Hyperlipidemia   . Hypertriglyceridemia   . Malignant melanoma of left side of neck ([place]) 10/25/2018  . Myocardial infarction ([place])   . Obesity   . OSA (obstructive sleep apnea)   . Persistent disorder of initiating or maintaining sleep   . Personal history of colonic polyps 02/05/2003    Social History   Tobacco Use  . Smoking status: Former Smoker    Packs/day: 1.50    Years: 30.00    Pack years: 45.00  Types: Cigarettes    Quit date: 09/19/1980    Years since quitting: 40.4  . Smokeless tobacco: Never Used  Vaping Use  . Vaping Use: Never used  Substance Use Topics  . Alcohol use: Not Currently    Alcohol/week: 7.0 - 14.0 standard drinks    Types: 7 - 14 Shots of liquor per week  . Drug use: No    Family History  Problem  Relation Age of Onset  . Liver cancer Father   . Lung cancer Father   . Heart disease Father   . Heart disease Sister   . Lung cancer Mother   . COPD Mother   . Obesity Mother   . Colon cancer Neg Hx   . Stomach cancer Neg Hx   . Pancreatic cancer Neg Hx    Allergies  Allergen Reactions  . Levofloxacin Rash  . Penicillins Hives and Rash    Tolerated Unasyn, cefepime and cephalexin    OBJECTIVE: Vitals:   02/10/21 0934  BP: 120/67  Pulse: 89  Temp: (!) 96.8 F (36 C)  TempSrc: Temporal   There is no height or weight on file to calculate BMI.   Physical Exam Constitutional:      Comments: He is alert and pleasant resting quietly on a stretcher.  Cardiovascular:     Rate and Rhythm: Normal rate.  Pulmonary:     Effort: Pulmonary effort is normal.  Abdominal:     Comments: He has a diverting colostomy and feeding tube.  Genitourinary:    Comments: Foley catheter in place Musculoskeletal:     Comments: As noted by his nurse yesterday, the right ischial wound has a pink base.  There is a scant amount of dried blood on his gauze dressing.  There is no exposed bone, purulence or odor.  He has a superficial pink sacral ulcer without evidence of infection.  Psychiatric:        Mood and Affect: Mood normal.     Microbiology: No results found for this or any previous visit (from the past 240 hour(s)).  Michel Bickers, MD [facility] [phone] pager   [phone] cell 02/10/2021, 10:06 AM

## 2021-02-11 DIAGNOSIS — L089 Local infection of the skin and subcutaneous tissue, unspecified: Secondary | ICD-10-CM | POA: Diagnosis not present

## 2021-02-11 DIAGNOSIS — R42 Dizziness and giddiness: Secondary | ICD-10-CM | POA: Diagnosis not present

## 2021-02-16 DIAGNOSIS — L89620 Pressure ulcer of left heel, unstageable: Secondary | ICD-10-CM | POA: Diagnosis not present

## 2021-02-16 DIAGNOSIS — L89154 Pressure ulcer of sacral region, stage 4: Secondary | ICD-10-CM | POA: Diagnosis not present

## 2021-02-16 DIAGNOSIS — R634 Abnormal weight loss: Secondary | ICD-10-CM | POA: Diagnosis not present

## 2021-02-16 DIAGNOSIS — L89210 Pressure ulcer of right hip, unstageable: Secondary | ICD-10-CM | POA: Diagnosis not present

## 2021-02-16 DIAGNOSIS — K592 Neurogenic bowel, not elsewhere classified: Secondary | ICD-10-CM | POA: Diagnosis not present

## 2021-02-16 DIAGNOSIS — R42 Dizziness and giddiness: Secondary | ICD-10-CM | POA: Diagnosis not present

## 2021-02-16 DIAGNOSIS — R110 Nausea: Secondary | ICD-10-CM | POA: Diagnosis not present

## 2021-02-16 DIAGNOSIS — L89890 Pressure ulcer of other site, unstageable: Secondary | ICD-10-CM | POA: Diagnosis not present

## 2021-02-22 DIAGNOSIS — F331 Major depressive disorder, recurrent, moderate: Secondary | ICD-10-CM | POA: Diagnosis not present

## 2021-02-22 DIAGNOSIS — F5101 Primary insomnia: Secondary | ICD-10-CM | POA: Diagnosis not present

## 2021-02-22 DIAGNOSIS — F411 Generalized anxiety disorder: Secondary | ICD-10-CM | POA: Diagnosis not present

## 2021-02-23 DIAGNOSIS — L89154 Pressure ulcer of sacral region, stage 4: Secondary | ICD-10-CM | POA: Diagnosis not present

## 2021-02-23 DIAGNOSIS — L89214 Pressure ulcer of right hip, stage 4: Secondary | ICD-10-CM | POA: Diagnosis not present

## 2021-02-23 DIAGNOSIS — L89620 Pressure ulcer of left heel, unstageable: Secondary | ICD-10-CM | POA: Diagnosis not present

## 2021-02-25 DIAGNOSIS — N319 Neuromuscular dysfunction of bladder, unspecified: Secondary | ICD-10-CM | POA: Diagnosis not present

## 2021-02-25 DIAGNOSIS — G373 Acute transverse myelitis in demyelinating disease of central nervous system: Secondary | ICD-10-CM | POA: Diagnosis not present

## 2021-02-25 DIAGNOSIS — K592 Neurogenic bowel, not elsewhere classified: Secondary | ICD-10-CM | POA: Diagnosis not present

## 2021-02-25 DIAGNOSIS — G8250 Quadriplegia, unspecified: Secondary | ICD-10-CM | POA: Diagnosis not present

## 2021-02-26 ENCOUNTER — Other Ambulatory Visit: Payer: Self-pay

## 2021-02-26 ENCOUNTER — Emergency Department (HOSPITAL_COMMUNITY)
Admission: EM | Admit: 2021-02-26 | Discharge: 2021-02-27 | Disposition: A | Discharge status: Home / Self Care | Payer: Medicare Other | Attending: Emergency Medicine | Admitting: Emergency Medicine

## 2021-02-26 ENCOUNTER — Encounter (HOSPITAL_COMMUNITY): Payer: Self-pay | Admitting: Emergency Medicine

## 2021-02-26 DIAGNOSIS — Z79899 Other long term (current) drug therapy: Secondary | ICD-10-CM | POA: Insufficient documentation

## 2021-02-26 DIAGNOSIS — F419 Anxiety disorder, unspecified: Secondary | ICD-10-CM | POA: Insufficient documentation

## 2021-02-26 DIAGNOSIS — Z87891 Personal history of nicotine dependence: Secondary | ICD-10-CM | POA: Insufficient documentation

## 2021-02-26 DIAGNOSIS — Z794 Long term (current) use of insulin: Secondary | ICD-10-CM | POA: Insufficient documentation

## 2021-02-26 DIAGNOSIS — Z955 Presence of coronary angioplasty implant and graft: Secondary | ICD-10-CM | POA: Insufficient documentation

## 2021-02-26 DIAGNOSIS — I2510 Atherosclerotic heart disease of native coronary artery without angina pectoris: Secondary | ICD-10-CM | POA: Insufficient documentation

## 2021-02-26 DIAGNOSIS — E1140 Type 2 diabetes mellitus with diabetic neuropathy, unspecified: Secondary | ICD-10-CM | POA: Insufficient documentation

## 2021-02-26 DIAGNOSIS — R69 Illness, unspecified: Secondary | ICD-10-CM

## 2021-02-26 DIAGNOSIS — Z7982 Long term (current) use of aspirin: Secondary | ICD-10-CM | POA: Insufficient documentation

## 2021-02-26 DIAGNOSIS — I10 Essential (primary) hypertension: Secondary | ICD-10-CM | POA: Insufficient documentation

## 2021-02-26 DIAGNOSIS — Z85820 Personal history of malignant melanoma of skin: Secondary | ICD-10-CM | POA: Insufficient documentation

## 2021-02-26 DIAGNOSIS — Z7984 Long term (current) use of oral hypoglycemic drugs: Secondary | ICD-10-CM | POA: Insufficient documentation

## 2021-02-26 DIAGNOSIS — E1151 Type 2 diabetes mellitus with diabetic peripheral angiopathy without gangrene: Secondary | ICD-10-CM | POA: Insufficient documentation

## 2021-02-26 DIAGNOSIS — D649 Anemia, unspecified: Secondary | ICD-10-CM

## 2021-02-26 LAB — CBC WITH DIFFERENTIAL/PLATELET
Abs Immature Granulocytes: 0.15 10*3/uL — ABNORMAL HIGH (ref 0.00–0.07)
Basophils Absolute: 0 10*3/uL (ref 0.0–0.1)
Basophils Relative: 0 %
Eosinophils Absolute: 0.3 10*3/uL (ref 0.0–0.5)
Eosinophils Relative: 3 %
HCT: 28.7 % — ABNORMAL LOW (ref 39.0–52.0)
Hemoglobin: 8.4 g/dL — ABNORMAL LOW (ref 13.0–17.0)
Immature Granulocytes: 1 %
Lymphocytes Relative: 20 %
Lymphs Abs: 2.1 10*3/uL (ref 0.7–4.0)
MCH: 23.2 pg — ABNORMAL LOW (ref 26.0–34.0)
MCHC: 29.3 g/dL — ABNORMAL LOW (ref 30.0–36.0)
MCV: 79.3 fL — ABNORMAL LOW (ref 80.0–100.0)
Monocytes Absolute: 0.6 10*3/uL (ref 0.1–1.0)
Monocytes Relative: 6 %
Neutro Abs: 7.4 10*3/uL (ref 1.7–7.7)
Neutrophils Relative %: 70 %
Platelets: 491 10*3/uL — ABNORMAL HIGH (ref 150–400)
RBC: 3.62 MIL/uL — ABNORMAL LOW (ref 4.22–5.81)
RDW: 21.2 % — ABNORMAL HIGH (ref 11.5–15.5)
WBC: 10.5 10*3/uL (ref 4.0–10.5)
nRBC: 0 % (ref 0.0–0.2)

## 2021-02-26 LAB — COMPREHENSIVE METABOLIC PANEL
ALT: 17 U/L (ref 0–44)
AST: 16 U/L (ref 15–41)
Albumin: 3.1 g/dL — ABNORMAL LOW (ref 3.5–5.0)
Alkaline Phosphatase: 122 U/L (ref 38–126)
Anion gap: 10 (ref 5–15)
BUN: 21 mg/dL (ref 8–23)
CO2: 27 mmol/L (ref 22–32)
Calcium: 9.5 mg/dL (ref 8.9–10.3)
Chloride: 98 mmol/L (ref 98–111)
Creatinine, Ser: 0.47 mg/dL — ABNORMAL LOW (ref 0.61–1.24)
GFR, Estimated: >60 mL/min (ref >60)
Glucose, Bld: 156 mg/dL — ABNORMAL HIGH (ref 70–99)
Potassium: 4.5 mmol/L (ref 3.5–5.1)
Sodium: 135 mmol/L (ref 135–145)
Total Bilirubin: 0.3 mg/dL (ref 0.3–1.2)
Total Protein: 7.6 g/dL (ref 6.5–8.1)

## 2021-02-26 LAB — PROTIME-INR
INR: 1 (ref 0.8–1.2)
Prothrombin Time: 12.7 seconds (ref 11.4–15.2)

## 2021-02-26 LAB — LACTIC ACID, PLASMA: Lactic Acid, Venous: 1.6 mmol/L (ref 0.5–1.9)

## 2021-02-26 LAB — POC OCCULT BLOOD, ED: Fecal Occult Bld: POSITIVE — AB

## 2021-02-26 NOTE — ED Notes (Signed)
[facility] contacted for transport. Significant delay in discharge transportation per [facility].

## 2021-02-26 NOTE — ED Triage Notes (Signed)
Patient arrives from [place] and rehab. Patient states facility did blood work and came in later stating that his hgb was 5.6. Patient denies symptoms. Patient is A&O x4, paraplegic

## 2021-02-26 NOTE — ED Provider Notes (Signed)
[facility] DEPT Provider Note   CSN: [id number] Arrival date & time: 02/26/21  2016     History Chief Complaint  Patient presents with   Low Hemoglobin    Brent [name]. is a 75 y.o. male.  HPI Patient is sent from SNF, [facility].  The patient reports that he has been feeling well.  He is nonambulatory due to transverse myelitis.  He reports that his blood work was checked and found to be low.  He reports they repeated it and it was still low at 5.6.  Patient reports that he does not have any symptoms.  He has not had any vomiting.  He does not have abdominal pain.  He denies he feels generally weak or lightheaded.  He denies he feels short of breath or chest pain.  He has a colostomy so he never sees his bowel movement looked dark or tarry in appearance.    Past Medical History:  Diagnosis Date   Allergy    CAD (coronary artery disease)    a. angioplasty of his RCA in 1990. b. bare metal stent placed in the RCA in 2000 followed by rotational atherectomy shortly after for stent restenosis. c. last cath was in 2012 showing stable moderate diffuse CAD. (70% mid LAD, 80% diagonal, 70% Ramus, 40% mid to distal RCA stent restenosis). d. Low risk nuc in 2015.   Diabetes mellitus    Diverticulosis    Elevated CK    Erectile dysfunction    Hemorrhoids    HTN (hypertension)    Hyperlipidemia    Hypertriglyceridemia    Malignant melanoma of left side of neck ([place]) 10/25/2018   Myocardial infarction ([place])    Obesity    OSA (obstructive sleep apnea)    Persistent disorder of initiating or maintaining sleep    Personal history of colonic polyps 02/05/2003    Patient Active Problem List   Diagnosis Date Noted   Right ischial pressure sore 02/10/2021   Severe sepsis ([place]) 12/22/2020   Sacral decubitus ulcer 12/22/2020   Nausea 12/22/2020   Encounter for monitoring immunomodulating therapy 08/20/2020   High risk medication use 08/20/2020    Psoas abscess, right ([place]) 06/05/2020   Malnutrition of moderate degree 06/04/2020   Lumbar discitis 06/02/2020   Palliative care by specialist    Goals of care, counseling/discussion    FTT (failure to thrive) in adult 06/01/2020   Abdominal pain    Sacral ulcer, with necrosis of muscle ([place]) 04/09/2020   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[place])    Transverse myelitis ([facility])    Labile blood pressure    Labile blood glucose    Neurogenic bladder    Neurogenic bowel    Acute blood loss anemia    Transaminitis    Controlled type 2 diabetes mellitus with hyperglycemia, with long-term current use of insulin ([place])    Dyslipidemia    HCAP (healthcare-associated pneumonia)    Paraplegia ([place])    Coronary artery disease involving native coronary artery of native heart without angina pectoris    Steroid-induced hyperglycemia    Diabetic peripheral neuropathy ([place])    Hyponatremia    AKI (acute kidney injury) ([place])    Weakness 03/09/2020   Chest pain 03/08/2020   Right leg numbness    Malignant melanoma of left side of neck ([place]) 10/25/2018   Facial neuritis 10/25/2018   Myofascial pain syndrome 10/25/2018   Occipital neuralgia of left side 10/25/2018   CAP (community acquired pneumonia)  09/08/2017   Male hypogonadism 06/20/2016   Renal lesion 06/20/2016   Insomnia 08/07/2015   Enlarged prostate with lower urinary tract symptoms (LUTS) 11/07/2014   Balanitis 07/30/2012   Bladder neck obstruction 07/30/2012   Prostate nodule 06/18/2012   Recurrent nephrolithiasis 06/18/2012   Benign neoplasm of colon 08/29/2011   DEPRESSION, SITUATIONAL, ACUTE 01/23/2010   Essential hypertension 05/30/2009   Coronary atherosclerosis 05/30/2009   Obstructive sleep apnea 11/28/2008   OBESITY 09/23/2008   ERECTILE DYSFUNCTION 11/26/2007   Well controlled type 2 diabetes mellitus with peripheral circulatory disorder ([place]) 05/09/2007   Hyperlipidemia 05/09/2007   MYOCARDIAL INFARCTION, HX OF 05/09/2007    DIVERTICULOSIS, COLON 05/09/2007    Past Surgical History:  Procedure Laterality Date   CHOLECYSTECTOMY     CORONARY ANGIOPLASTY     CORONARY STENT PLACEMENT     stenting of the right coronary artery with followup rotational  atherectomy. (3 stents placed)   FINGER SURGERY     right   FOOT SURGERY     right   INGUINAL HERNIA REPAIR     right   IRRIGATION AND DEBRIDEMENT ABSCESS N/A 06/04/2020   Procedure: IRRIGATION AND DEBRIDEMENT SACRAL WOUND;  Surgeon: Jesusita Oka, MD;  Location: [facility];  Service: General;  Laterality: N/A;   LAPAROSCOPY N/A 06/04/2020   Procedure: LAPAROSCOPY ASSISTED COLOSTOMY, LAPAROSCOPY GASTROSTOMY;  Surgeon: Jesusita Oka, MD;  Location: [facility];  Service: General;  Laterality: N/A;   LYSIS OF ADHESION N/A 06/04/2020   Procedure: LYSIS OF ADHESION;  Surgeon: Jesusita Oka, MD;  Location: [facility];  Service: General;  Laterality: N/A;   melanoma removal     neck   ORTHOPEDIC SURGERY     foot right   rotator cuff surg     Bil       Family History  Problem Relation Age of Onset   Liver cancer Father    Lung cancer Father    Heart disease Father    Heart disease Sister    Lung cancer Mother    COPD Mother    Obesity Mother    Colon cancer Neg Hx    Stomach cancer Neg Hx    Pancreatic cancer Neg Hx     Social History   Tobacco Use   Smoking status: Former    Packs/day: 1.50    Years: 30.00    Pack years: 45.00    Types: Cigarettes    Quit date: 09/19/1980    Years since quitting: 40.4   Smokeless tobacco: Never  Vaping Use   Vaping Use: Never used  Substance Use Topics   Alcohol use: Not Currently    Alcohol/week: 7.0 - 14.0 standard drinks    Types: 7 - 14 Shots of liquor per week   Drug use: No    Home Medications Prior to Admission medications   Medication Sig Start Date End Date Taking? Authorizing Provider  acetaminophen (TYLENOL) 325 MG tablet Take 2 tablets (650 mg total) by mouth every 6 (six) hours as needed for  headache. 04/24/20  Yes Love, Ivan Anchors, PA-C  albuterol (PROVENTIL) (2.5 MG/3ML) 0.083% nebulizer solution Take 3 mLs (2.5 mg total) by nebulization once as needed for wheezing or shortness of breath (or anaphylaxis due to Rituxan infusion). Patient taking differently: Take 2.5 mg by nebulization every 6 (six) hours as needed for wheezing or shortness of breath (or anaphylaxis due to Rituxan infusion). 04/24/20  Yes Love, Ivan Anchors, PA-C  Amino Acids-Protein Hydrolys (FEEDING SUPPLEMENT, PRO-STAT  SUGAR FREE 64,) LIQD Take 30 mLs by mouth in the morning and at bedtime.   Yes [provider]  antiseptic oral rinse (BIOTENE) LIQD 10 mLs by Mouth Rinse route in the morning, at noon, and at bedtime. Swish & Spit   Yes [provider]  Ascorbic Acid (VITA-C PO) Take 500 mg by mouth daily.   Yes [provider]  aspirin EC 81 MG tablet Take 81 mg by mouth daily. Swallow whole.   Yes [provider]  atorvastatin (LIPITOR) 20 MG tablet Take 1 tablet (20 mg total) by mouth daily. Patient taking differently: Take 20 mg by mouth at bedtime. 03/26/20  Yes Shelly Coss, MD  Baclofen 5 MG TABS Take 1 tablet by mouth at bedtime. 02/25/21  Yes [provider]  dronabinol (MARINOL) 2.5 MG capsule Take 2.5 mg by mouth in the morning and at bedtime. 01/28/21  Yes [provider]  ferrous gluconate (FERGON) 324 MG tablet Take 324 mg by mouth 2 (two) times daily with a meal.   Yes [provider]  fluticasone (FLONASE) 50 MCG/ACT nasal spray Place 2 sprays into both nostrils 2 (two) times daily. Patient taking differently: Place 1 spray into both nostrils daily. 04/24/20  Yes Love, Ivan Anchors, PA-C  insulin aspart (NOVOLOG) 100 UNIT/ML injection Inject 0-15 Units into the skin every 4 (four) hours. Sliding scale  CBG 70 - 120: 0 units: CBG 121 - 150: 2 units; CBG 151 - 200: 3 units; CBG 201 - 250: 5 units; CBG 251 - 300: 8 units;CBG 301 - 350: 11 units; CBG 351 - 400: 15  units; CBG > 400 : 15 units and notify MD Patient taking differently: Inject 0-15 Units into the skin See admin instructions. Sliding scale  If CBG>70 Call NP/PA, CBG 70 - 120: 0 units: CBG 121 - 150: 2 units; CBG 151 - 200: 3 units; CBG 201 - 250: 5 units; CBG 251 - 300: 8 units; CBG 301 - 350: 11 units; CBG 351 - 400: 15units and  CBG >400: 15 Units, CBG>400 Call MD 06/10/20  Yes Rai, Ripudeep K, MD  insulin glargine (LANTUS) 100 unit/mL SOPN Inject 15 Units into the skin at bedtime. Patient taking differently: Inject 23 Units into the skin 2 (two) times daily. 06/10/20  Yes Rai, Ripudeep K, MD  Lidocaine 4 % PTCH Apply 1 patch topically 2 (two) times daily. Apply to right hip in the morning remove at night   Yes [provider]  lisinopril (ZESTRIL) 20 MG tablet Take 1 tablet by mouth daily. 01/01/21  Yes [provider]  magnesium hydroxide (MILK OF MAGNESIA) 400 MG/5ML suspension Take 30 mLs by mouth daily as needed for mild constipation.   Yes [provider]  Menthol (ICY HOT ADVANCED RELIEF) 7.5 % PTCH Apply 1 patch topically daily.   Yes [provider]  Menthol, Topical Analgesic, (BIOFREEZE) 4 % GEL Apply 1 application topically in the morning, at noon, and at bedtime.    Yes [provider]  metFORMIN (GLUCOPHAGE) 500 MG tablet Take 250 mg by mouth 2 (two) times daily with a meal.   Yes [provider]  methenamine (HIPREX) 1 g tablet Take 1 g by mouth 2 (two) times daily. 02/22/21  Yes [provider]  Nutritional Supplements (FEEDING SUPPLEMENT, GLUCERNA 1.5 CAL,) LIQD Place 75 mLs into feeding tube continuous. 75 ml/hr   Yes [provider]  pantoprazole (PROTONIX) 40 MG tablet Take 40 mg by mouth  2 (two) times daily. 10/19/20  Yes [provider]  polyvinyl alcohol (LIQUIFILM TEARS) 1.4 % ophthalmic solution Place 2 drops into both eyes 2 (two) times daily as needed for dry eyes.   Yes [provider]   PSYLLIUM PO Take 0.4 g by mouth in the morning and at bedtime.   Yes [provider]  senna-docusate (SENOKOT-S) 8.6-50 MG tablet Take 2 tablets by mouth daily.   Yes [provider]  sertraline (ZOLOFT) 50 MG tablet Take 50 mg by mouth at bedtime. 11/10/20  Yes [provider]  traMADol (ULTRAM) 50 MG tablet Take 50 mg by mouth 2 (two) times daily as needed for moderate pain.   Yes [provider]  traZODone (DESYREL) 50 MG tablet Take 50 mg by mouth at bedtime.   Yes [provider]  vitamin B-12 (CYANOCOBALAMIN) 500 MCG tablet Take 500 mcg by mouth daily.   Yes [provider]  collagenase (SANTYL) ointment Apply topically daily. Patient not taking: No sig reported 11/23/20   Dana Allan I, MD  gabapentin (NEURONTIN) 300 MG capsule Place 1 capsule (300 mg total) into feeding tube at bedtime. Patient not taking: No sig reported 06/10/20   Rai, Vernelle Emerald, MD  Icosapent Ethyl (VASCEPA) 1 g CAPS Take 2 capsules (2 g total) by mouth 2 (two) times daily. Patient not taking: Reported on 02/26/2021 07/19/19   Charlie Pitter, PA-C  tamsulosin (FLOMAX) 0.4 MG CAPS capsule Take 1 capsule (0.4 mg total) by mouth daily after supper. Patient not taking: Reported on 02/26/2021 06/10/20   Rai, Vernelle Emerald, MD  zinc sulfate 220 (50 Zn) MG capsule Place 1 capsule (220 mg total) into feeding tube daily. Patient not taking: No sig reported 06/10/20   Rai, Vernelle Emerald, MD    Allergies    Levofloxacin and Penicillins  Review of Systems   Review of Systems 10 systems reviewed and negative except as per HPI Physical Exam Updated Vital Signs BP (!) 109/56   Pulse 72   Temp 98.3 F (36.8 C) (Oral)   Resp 12   SpO2 100%   Physical Exam Constitutional:      Comments: Alert nontoxic.  No respiratory distress.  Clear mental status.  HENT:     Mouth/Throat:     Pharynx: Oropharynx is clear.  Cardiovascular:     Rate and Rhythm: Normal rate and regular  rhythm.  Pulmonary:     Effort: Pulmonary effort is normal.     Breath sounds: Normal breath sounds.  Abdominal:     General: There is no distension.     Palpations: Abdomen is soft.     Tenderness: There is no abdominal tenderness. There is no guarding.     Comments: Colostomy with some firm stool in it.  Bag is not clear.  Cannot visualize stool appearance.  Musculoskeletal:     Comments: Patient has compression socks on both lower extremities.  Skin:    General: Skin is warm and dry.     Coloration: Skin is pale.  Neurological:     Comments: Patient is alert and interactive.  Cognitively intact.  Speech clear.  He has some significant physical limitations due to underlying neurologic dysfunction.  Psychiatric:        Mood and Affect: Mood normal.    ED Results / Procedures / Treatments   Labs (all labs ordered are listed, but only abnormal results are displayed) Labs Reviewed  COMPREHENSIVE METABOLIC PANEL - Abnormal; Notable for the  following components:      Result Value   Glucose, Bld 156 (*)    Creatinine, Ser 0.47 (*)    Albumin 3.1 (*)    All other components within normal limits  CBC WITH DIFFERENTIAL/PLATELET - Abnormal; Notable for the following components:   RBC 3.62 (*)    Hemoglobin 8.4 (*)    HCT 28.7 (*)    MCV 79.3 (*)    MCH 23.2 (*)    MCHC 29.3 (*)    RDW 21.2 (*)    Platelets 491 (*)    Abs Immature Granulocytes 0.15 (*)    All other components within normal limits  POC OCCULT BLOOD, ED - Abnormal; Notable for the following components:   Fecal Occult Bld POSITIVE (*)    All other components within normal limits  LACTIC ACID, PLASMA  PROTIME-INR  LACTIC ACID, PLASMA    EKG None  Radiology No results found.  Procedures Procedures   Medications Ordered in ED Medications - No data to display  ED Course  I have reviewed the triage vital signs and the nursing notes.  Pertinent labs & imaging results that were available during my care of  the patient were reviewed by me and considered in my medical decision making (see chart for details).    MDM Rules/Calculators/A&P                          Patient does not have evident bleeding source.  He is asymptomatic.  Blood pressures are stable.  We will proceed with CBC and chemistries.  We will test stool for occult blood.  Patient is not on blood thinners.  Hemoglobin is at patient's baseline at 8.6.  He is asymptomatic and vital signs are stable.  At this point it appears that the hemoglobin obtained at 5.6 earlier in the day is likely spurious.  Plan to return to rehab facility with continued close monitoring. Final Clinical Impression(s) / ED Diagnoses Final diagnoses:  Anemia, unspecified type  Severe comorbid illness    Rx / DC Orders ED Discharge Orders     None        Charlesetta Shanks, MD 02/26/21 2252

## 2021-02-26 NOTE — ED Notes (Signed)
Attempted to give report. After phone call transferred to patient's hall, no answer. Will re-attempt to give report.

## 2021-02-26 NOTE — Discharge Instructions (Addendum)
1.  The hemoglobin checked in the emergency department is at the patient's normal baseline at 8.4.  He does not have any symptoms and feels well.  It appears the hemoglobin tested earlier today at 5.6 was likely an error. 2.  Continue to monitor the patient for any signs of complicating illness different from baseline.

## 2021-03-01 DIAGNOSIS — F3289 Other specified depressive episodes: Secondary | ICD-10-CM | POA: Diagnosis not present

## 2021-03-01 DIAGNOSIS — E875 Hyperkalemia: Secondary | ICD-10-CM | POA: Diagnosis not present

## 2021-03-01 DIAGNOSIS — I152 Hypertension secondary to endocrine disorders: Secondary | ICD-10-CM | POA: Diagnosis not present

## 2021-03-01 DIAGNOSIS — D520 Dietary folate deficiency anemia: Secondary | ICD-10-CM | POA: Diagnosis not present

## 2021-03-02 DIAGNOSIS — G360 Neuromyelitis optica [Devic]: Secondary | ICD-10-CM | POA: Diagnosis not present

## 2021-03-02 DIAGNOSIS — L89623 Pressure ulcer of left heel, stage 3: Secondary | ICD-10-CM | POA: Diagnosis not present

## 2021-03-02 DIAGNOSIS — L89154 Pressure ulcer of sacral region, stage 4: Secondary | ICD-10-CM | POA: Diagnosis not present

## 2021-03-02 DIAGNOSIS — L89214 Pressure ulcer of right hip, stage 4: Secondary | ICD-10-CM | POA: Diagnosis not present

## 2021-03-03 ENCOUNTER — Encounter (HOSPITAL_COMMUNITY): Payer: Self-pay

## 2021-03-03 ENCOUNTER — Emergency Department (HOSPITAL_COMMUNITY): Payer: Medicare Other

## 2021-03-03 ENCOUNTER — Emergency Department (HOSPITAL_COMMUNITY)
Admission: EM | Admit: 2021-03-03 | Discharge: 2021-03-03 | Disposition: A | Discharge status: Home / Self Care | Payer: Medicare Other | Attending: Emergency Medicine | Admitting: Emergency Medicine

## 2021-03-03 ENCOUNTER — Other Ambulatory Visit: Payer: Self-pay

## 2021-03-03 DIAGNOSIS — Z85820 Personal history of malignant melanoma of skin: Secondary | ICD-10-CM | POA: Diagnosis not present

## 2021-03-03 DIAGNOSIS — Z955 Presence of coronary angioplasty implant and graft: Secondary | ICD-10-CM | POA: Insufficient documentation

## 2021-03-03 DIAGNOSIS — Z7982 Long term (current) use of aspirin: Secondary | ICD-10-CM | POA: Insufficient documentation

## 2021-03-03 DIAGNOSIS — E1159 Type 2 diabetes mellitus with other circulatory complications: Secondary | ICD-10-CM | POA: Diagnosis not present

## 2021-03-03 DIAGNOSIS — K9423 Gastrostomy malfunction: Secondary | ICD-10-CM | POA: Insufficient documentation

## 2021-03-03 DIAGNOSIS — E1165 Type 2 diabetes mellitus with hyperglycemia: Secondary | ICD-10-CM | POA: Diagnosis not present

## 2021-03-03 DIAGNOSIS — Z79899 Other long term (current) drug therapy: Secondary | ICD-10-CM | POA: Insufficient documentation

## 2021-03-03 DIAGNOSIS — Z4682 Encounter for fitting and adjustment of non-vascular catheter: Secondary | ICD-10-CM | POA: Diagnosis not present

## 2021-03-03 DIAGNOSIS — Z794 Long term (current) use of insulin: Secondary | ICD-10-CM | POA: Insufficient documentation

## 2021-03-03 DIAGNOSIS — E1140 Type 2 diabetes mellitus with diabetic neuropathy, unspecified: Secondary | ICD-10-CM | POA: Insufficient documentation

## 2021-03-03 DIAGNOSIS — T85528A Displacement of other gastrointestinal prosthetic devices, implants and grafts, initial encounter: Secondary | ICD-10-CM

## 2021-03-03 DIAGNOSIS — Z7984 Long term (current) use of oral hypoglycemic drugs: Secondary | ICD-10-CM | POA: Insufficient documentation

## 2021-03-03 DIAGNOSIS — Z85038 Personal history of other malignant neoplasm of large intestine: Secondary | ICD-10-CM | POA: Insufficient documentation

## 2021-03-03 DIAGNOSIS — X58XXXA Exposure to other specified factors, initial encounter: Secondary | ICD-10-CM | POA: Insufficient documentation

## 2021-03-03 DIAGNOSIS — T182XXA Foreign body in stomach, initial encounter: Secondary | ICD-10-CM | POA: Diagnosis not present

## 2021-03-03 DIAGNOSIS — I10 Essential (primary) hypertension: Secondary | ICD-10-CM | POA: Insufficient documentation

## 2021-03-03 DIAGNOSIS — I2510 Atherosclerotic heart disease of native coronary artery without angina pectoris: Secondary | ICD-10-CM | POA: Diagnosis not present

## 2021-03-03 DIAGNOSIS — Z452 Encounter for adjustment and management of vascular access device: Secondary | ICD-10-CM | POA: Diagnosis not present

## 2021-03-03 DIAGNOSIS — K9429 Other complications of gastrostomy: Secondary | ICD-10-CM | POA: Diagnosis not present

## 2021-03-03 DIAGNOSIS — Z87891 Personal history of nicotine dependence: Secondary | ICD-10-CM | POA: Insufficient documentation

## 2021-03-03 MED ORDER — IOHEXOL 300 MG/ML  SOLN
30.0000 mL | Freq: Once | INTRAMUSCULAR | Status: AC | PRN
  Administered 2021-03-03: 30 mL

## 2021-03-03 NOTE — ED Notes (Signed)
New dressing applied to jpeg site

## 2021-03-03 NOTE — ED Provider Notes (Addendum)
[place] DEPT Provider Note   CSN: [id number] Arrival date & time: 03/03/21  1241     History Chief Complaint  Patient presents with   JPEG issue    Brent [name]. is a 75 y.o. male.  HPI      75 year old male with history of CAD, diabetes, myelitis with quadriplegia who resides at [facility] and comes in with his PEG tube out.  Patient suspects that the PEG tube was used this morning, it came out thereafter.  He has no complaints from his side.  He denies any pain, fevers, chills, vomiting. Past Medical History:  Diagnosis Date   Allergy    CAD (coronary artery disease)    a. angioplasty of his RCA in 1990. b. bare metal stent placed in the RCA in 2000 followed by rotational atherectomy shortly after for stent restenosis. c. last cath was in 2012 showing stable moderate diffuse CAD. (70% mid LAD, 80% diagonal, 70% Ramus, 40% mid to distal RCA stent restenosis). d. Low risk nuc in 2015.   Diabetes mellitus    Diverticulosis    Elevated CK    Erectile dysfunction    Hemorrhoids    HTN (hypertension)    Hyperlipidemia    Hypertriglyceridemia    Malignant melanoma of left side of neck ([place]) 10/25/2018   Myocardial infarction ([place])    Obesity    OSA (obstructive sleep apnea)    Persistent disorder of initiating or maintaining sleep    Personal history of colonic polyps 02/05/2003    Patient Active Problem List   Diagnosis Date Noted   Right ischial pressure sore 02/10/2021   Severe sepsis ([place]) 12/22/2020   Sacral decubitus ulcer 12/22/2020   Nausea 12/22/2020   Encounter for monitoring immunomodulating therapy 08/20/2020   High risk medication use 08/20/2020   Psoas abscess, right ([place]) 06/05/2020   Malnutrition of moderate degree 06/04/2020   Lumbar discitis 06/02/2020   Palliative care by specialist    Goals of care, counseling/discussion    FTT (failure to thrive) in adult 06/01/2020   Abdominal pain    Sacral ulcer,  with necrosis of muscle ([place]) 04/09/2020   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[place])    Transverse myelitis (H[facility])    Labile blood pressure    Labile blood glucose    Neurogenic bladder    Neurogenic bowel    Acute blood loss anemia    Transaminitis    Controlled type 2 diabetes mellitus with hyperglycemia, with long-term current use of insulin ([place])    Dyslipidemia    HCAP (healthcare-associated pneumonia)    Paraplegia ([place])    Coronary artery disease involving native coronary artery of native heart without angina pectoris    Steroid-induced hyperglycemia    Diabetic peripheral neuropathy ([place])    Hyponatremia    AKI (acute kidney injury) ([place])    Weakness 03/09/2020   Chest pain 03/08/2020   Right leg numbness    Malignant melanoma of left side of neck ([place]) 10/25/2018   Facial neuritis 10/25/2018   Myofascial pain syndrome 10/25/2018   Occipital neuralgia of left side 10/25/2018   CAP (community acquired pneumonia) 09/08/2017   Male hypogonadism 06/20/2016   Renal lesion 06/20/2016   Insomnia 08/07/2015   Enlarged prostate with lower urinary tract symptoms (LUTS) 11/07/2014   Balanitis 07/30/2012   Bladder neck obstruction 07/30/2012   Prostate nodule 06/18/2012   Recurrent nephrolithiasis 06/18/2012   Benign neoplasm of colon 08/29/2011   DEPRESSION, SITUATIONAL, ACUTE 01/23/2010  Essential hypertension 05/30/2009   Coronary atherosclerosis 05/30/2009   Obstructive sleep apnea 11/28/2008   OBESITY 09/23/2008   ERECTILE DYSFUNCTION 11/26/2007   Well controlled type 2 diabetes mellitus with peripheral circulatory disorder ([place]) 05/09/2007   Hyperlipidemia 05/09/2007   MYOCARDIAL INFARCTION, HX OF 05/09/2007   DIVERTICULOSIS, COLON 05/09/2007    Past Surgical History:  Procedure Laterality Date   CHOLECYSTECTOMY     CORONARY ANGIOPLASTY     CORONARY STENT PLACEMENT     stenting of the right coronary artery with followup rotational  atherectomy. (3 stents placed)   FINGER  SURGERY     right   FOOT SURGERY     right   INGUINAL HERNIA REPAIR     right   IRRIGATION AND DEBRIDEMENT ABSCESS N/A 06/04/2020   Procedure: IRRIGATION AND DEBRIDEMENT SACRAL WOUND;  Surgeon: Jesusita Oka, MD;  Location: [place];  Service: General;  Laterality: N/A;   LAPAROSCOPY N/A 06/04/2020   Procedure: LAPAROSCOPY ASSISTED COLOSTOMY, LAPAROSCOPY GASTROSTOMY;  Surgeon: Jesusita Oka, MD;  Location: [place];  Service: General;  Laterality: N/A;   LYSIS OF ADHESION N/A 06/04/2020   Procedure: LYSIS OF ADHESION;  Surgeon: Jesusita Oka, MD;  Location: [place];  Service: General;  Laterality: N/A;   melanoma removal     neck   ORTHOPEDIC SURGERY     foot right   rotator cuff surg     Bil       Family History  Problem Relation Age of Onset   Liver cancer Father    Lung cancer Father    Heart disease Father    Heart disease Sister    Lung cancer Mother    COPD Mother    Obesity Mother    Colon cancer Neg Hx    Stomach cancer Neg Hx    Pancreatic cancer Neg Hx     Social History   Tobacco Use   Smoking status: Former    Packs/day: 1.50    Years: 30.00    Pack years: 45.00    Types: Cigarettes    Quit date: 09/19/1980    Years since quitting: 40.4   Smokeless tobacco: Never  Vaping Use   Vaping Use: Never used  Substance Use Topics   Alcohol use: Not Currently    Alcohol/week: 7.0 - 14.0 standard drinks    Types: 7 - 14 Shots of liquor per week   Drug use: No    Home Medications Prior to Admission medications   Medication Sig Start Date End Date Taking? Authorizing Provider  acetaminophen (TYLENOL) 325 MG tablet Take 2 tablets (650 mg total) by mouth every 6 (six) hours as needed for headache. 04/24/20   Love, Ivan Anchors, PA-C  albuterol (PROVENTIL) (2.5 MG/3ML) 0.083% nebulizer solution Take 3 mLs (2.5 mg total) by nebulization once as needed for wheezing or shortness of breath (or anaphylaxis due to Rituxan infusion). Patient taking differently: Take 2.5 mg by  nebulization every 6 (six) hours as needed for wheezing or shortness of breath (or anaphylaxis due to Rituxan infusion). 04/24/20   Love, Ivan Anchors, PA-C  Amino Acids-Protein Hydrolys (FEEDING SUPPLEMENT, PRO-STAT SUGAR FREE 64,) LIQD Take 30 mLs by mouth in the morning and at bedtime.    [provider]  antiseptic oral rinse (BIOTENE) LIQD 10 mLs by Mouth Rinse route in the morning, at noon, and at bedtime. Swish & Spit    [provider]  Ascorbic Acid (VITA-C PO) Take 500 mg by mouth daily.  [provider]  aspirin EC 81 MG tablet Take 81 mg by mouth daily. Swallow whole.    [provider]  atorvastatin (LIPITOR) 20 MG tablet Take 1 tablet (20 mg total) by mouth daily. Patient taking differently: Take 20 mg by mouth at bedtime. 03/26/20   Shelly Coss, MD  Baclofen 5 MG TABS Take 1 tablet by mouth at bedtime. 02/25/21   [provider]  collagenase (SANTYL) ointment Apply topically daily. Patient not taking: No sig reported 11/23/20   Dana Allan I, MD  dronabinol (MARINOL) 2.5 MG capsule Take 2.5 mg by mouth in the morning and at bedtime. 01/28/21   [provider]  ferrous gluconate (FERGON) 324 MG tablet Take 324 mg by mouth 2 (two) times daily with a meal.    [provider]  fluticasone (FLONASE) 50 MCG/ACT nasal spray Place 2 sprays into both nostrils 2 (two) times daily. Patient taking differently: Place 1 spray into both nostrils daily. 04/24/20   Love, Ivan Anchors, PA-C  gabapentin (NEURONTIN) 300 MG capsule Place 1 capsule (300 mg total) into feeding tube at bedtime. Patient not taking: No sig reported 06/10/20   Rai, Vernelle Emerald, MD  Icosapent Ethyl (VASCEPA) 1 g CAPS Take 2 capsules (2 g total) by mouth 2 (two) times daily. Patient not taking: Reported on 02/26/2021 07/19/19   Charlie Pitter, PA-C  insulin aspart (NOVOLOG) 100 UNIT/ML injection Inject 0-15 Units into the skin every 4 (four) hours. Sliding scale  CBG 70 - 120:  0 units: CBG 121 - 150: 2 units; CBG 151 - 200: 3 units; CBG 201 - 250: 5 units; CBG 251 - 300: 8 units;CBG 301 - 350: 11 units; CBG 351 - 400: 15 units; CBG > 400 : 15 units and notify MD Patient taking differently: Inject 0-15 Units into the skin See admin instructions. Sliding scale  If CBG>70 Call NP/PA, CBG 70 - 120: 0 units: CBG 121 - 150: 2 units; CBG 151 - 200: 3 units; CBG 201 - 250: 5 units; CBG 251 - 300: 8 units; CBG 301 - 350: 11 units; CBG 351 - 400: 15units and  CBG >400: 15 Units, CBG>400 Call MD 06/10/20   Rai, Ripudeep K, MD  insulin glargine (LANTUS) 100 unit/mL SOPN Inject 15 Units into the skin at bedtime. Patient taking differently: Inject 23 Units into the skin 2 (two) times daily. 06/10/20   Rai, Ripudeep K, MD  Lidocaine 4 % PTCH Apply 1 patch topically 2 (two) times daily. Apply to right hip in the morning remove at night    [provider]  lisinopril (ZESTRIL) 20 MG tablet Take 1 tablet by mouth daily. 01/01/21   [provider]  magnesium hydroxide (MILK OF MAGNESIA) 400 MG/5ML suspension Take 30 mLs by mouth daily as needed for mild constipation.    [provider]  Menthol (ICY HOT ADVANCED RELIEF) 7.5 % PTCH Apply 1 patch topically daily.    [provider]  Menthol, Topical Analgesic, (BIOFREEZE) 4 % GEL Apply 1 application topically in the morning, at noon, and at bedtime.     [provider]  metFORMIN (GLUCOPHAGE) 500 MG tablet Take 250 mg by mouth 2 (two) times daily with a meal.    [provider]  methenamine (HIPREX) 1 g tablet Take 1 g by mouth 2 (two) times daily. 02/22/21   [provider]  Nutritional Supplements (FEEDING SUPPLEMENT, GLUCERNA 1.5 CAL,) LIQD Place 75 mLs into feeding tube continuous. 75  ml/hr    [provider]  pantoprazole (PROTONIX) 40 MG tablet Take 40 mg by mouth 2 (two) times daily. 10/19/20   [provider]  polyvinyl alcohol (LIQUIFILM TEARS) 1.4 % ophthalmic  solution Place 2 drops into both eyes 2 (two) times daily as needed for dry eyes.    [provider]  PSYLLIUM PO Take 0.4 g by mouth in the morning and at bedtime.    [provider]  senna-docusate (SENOKOT-S) 8.6-50 MG tablet Take 2 tablets by mouth daily.    [provider]  sertraline (ZOLOFT) 50 MG tablet Take 50 mg by mouth at bedtime. 11/10/20   [provider]  tamsulosin (FLOMAX) 0.4 MG CAPS capsule Take 1 capsule (0.4 mg total) by mouth daily after supper. Patient not taking: Reported on 02/26/2021 06/10/20   Rai, Vernelle Emerald, MD  traMADol (ULTRAM) 50 MG tablet Take 50 mg by mouth 2 (two) times daily as needed for moderate pain.    [provider]  traZODone (DESYREL) 50 MG tablet Take 50 mg by mouth at bedtime.    [provider]  vitamin B-12 (CYANOCOBALAMIN) 500 MCG tablet Take 500 mcg by mouth daily.    [provider]  zinc sulfate 220 (50 Zn) MG capsule Place 1 capsule (220 mg total) into feeding tube daily. Patient not taking: No sig reported 06/10/20   Rai, Vernelle Emerald, MD    Allergies    Levofloxacin and Penicillins  Review of Systems   Review of Systems  Constitutional:  Negative for activity change.  Gastrointestinal:  Negative for anal bleeding.  Skin:  Positive for rash.  Hematological:  Does not bruise/bleed easily.   Physical Exam Updated Vital Signs BP 118/60 (BP Location: Right Arm)   Pulse 76   Temp 97.9 F (36.6 C) (Oral)   Resp 14   Ht 6' (1.829 m)   Wt 83 kg   SpO2 100%   BMI 24.82 kg/m   Physical Exam Vitals and nursing note reviewed.  Constitutional:      Appearance: He is well-developed.  HENT:     Head: Atraumatic.  Cardiovascular:     Rate and Rhythm: Normal rate.  Pulmonary:     Effort: Pulmonary effort is normal.  Abdominal:     Tenderness: There is no abdominal tenderness.  Musculoskeletal:     Cervical back: Neck supple.  Skin:    General: Skin is warm.     Findings:  Erythema present.     Comments: Erythema at the base of the PEG tube site  Neurological:     Mental Status: He is alert and oriented to person, place, and time.    ED Results / Procedures / Treatments   Labs (all labs ordered are listed, but only abnormal results are displayed) Labs Reviewed - No data to display  EKG None  Radiology No results found.  Procedures Gastrostomy tube replacement  Date/Time: 03/03/2021 2:19 PM Performed by: Varney Biles, MD Authorized by: Varney Biles, MD  Consent: Verbal consent obtained. Risks and benefits: risks, benefits and alternatives were discussed Consent given by: patient Patient understanding: patient states understanding of the procedure being performed Patient identity confirmed: arm band Time out: Immediately prior to procedure a "time out" was called to verify the correct patient, procedure, equipment, support staff and site/side marked as required. Preparation: Patient was prepped and draped in the usual sterile fashion. Local anesthesia used: no  Anesthesia: Local anesthesia used: no  Sedation: Patient sedated: no  Patient tolerance: patient tolerated the procedure well with no immediate complications Comments: 52 French G-tube placed   .Foreign Body Removal  Date/Time: 03/03/2021 2:23 PM Performed by: Varney Biles, MD Authorized by: Varney Biles, MD  Consent: Verbal consent obtained. Risks and benefits: risks, benefits and alternatives were discussed Consent given by: patient Patient understanding: patient states understanding of the procedure being performed Patient identity confirmed: arm band Body area: skin General location: trunk Location details: abdomen Complexity: simple 1 objects recovered. Objects recovered: food material/debri in the G tube stoma    Medications Ordered in ED Medications  iohexol (OMNIPAQUE) 300 MG/ML solution 30 mL (30 mLs Per Tube Contrast Given 03/03/21 1419)    ED  Course  I have reviewed the triage vital signs and the nursing notes.  Pertinent labs & imaging results that were available during my care of the patient were reviewed by me and considered in my medical decision making (see chart for details).    MDM Rules/Calculators/A&P                          75 year old comes in with chief complaint of dislodged G-tube. Patient has history of quadriplegia, diabetes, CAD.  He resides at a nursing facility.  Displaced G-tube is a primary complaint without any associated symptoms.  On exam, it does appear that there is some excoriation of the skin at the base of the G-tube wound site.  We removed foreign body that was present in the stoma, and placed a 16 [place] G-tube that was available in the ER.  Patient originally had an 17 [place] G-tube.  Final Clinical Impression(s) / ED Diagnoses Final diagnoses:  Gastrojejunostomy tube dislodgement    Rx / DC Orders ED Discharge Orders     None          Varney Biles, MD 03/03/21 1534

## 2021-03-03 NOTE — ED Notes (Signed)
Brought pt water, emptied 800cc from foley bag

## 2021-03-03 NOTE — ED Notes (Signed)
[facility] here to take pt back to SNF

## 2021-03-03 NOTE — ED Notes (Signed)
Brought pt sandwich

## 2021-03-03 NOTE — ED Triage Notes (Signed)
Pt arrives from [facility] d/t jpeg tube coming out today. Pt unsure how tube fell out. Pt is A&O x 4. Pt is paraplegic

## 2021-03-03 NOTE — Discharge Instructions (Addendum)
Brent [name] had a 16 Fr gastric tube placed in the ER. He had come into the hospital with 18 French tube.  If the smaller feeding tube is not sufficient, please have the provider at your facility facilitate placement of the appropriate sized tube.  Also please ensure that he is getting some barrier cream applied at the PEG tube site, the skin there is certainly irritated.

## 2021-03-03 NOTE — ED Notes (Signed)
[facility] called for transport back to [facility]

## 2021-03-04 ENCOUNTER — Emergency Department (HOSPITAL_COMMUNITY): Payer: Medicare Other

## 2021-03-04 ENCOUNTER — Emergency Department (HOSPITAL_COMMUNITY)
Admission: EM | Admit: 2021-03-04 | Discharge: 2021-03-04 | Disposition: A | Discharge status: Home / Self Care | Payer: Medicare Other | Attending: Emergency Medicine | Admitting: Emergency Medicine

## 2021-03-04 DIAGNOSIS — Z7982 Long term (current) use of aspirin: Secondary | ICD-10-CM | POA: Insufficient documentation

## 2021-03-04 DIAGNOSIS — K9423 Gastrostomy malfunction: Secondary | ICD-10-CM | POA: Insufficient documentation

## 2021-03-04 DIAGNOSIS — T85528A Displacement of other gastrointestinal prosthetic devices, implants and grafts, initial encounter: Secondary | ICD-10-CM

## 2021-03-04 DIAGNOSIS — Z794 Long term (current) use of insulin: Secondary | ICD-10-CM | POA: Diagnosis not present

## 2021-03-04 DIAGNOSIS — E785 Hyperlipidemia, unspecified: Secondary | ICD-10-CM | POA: Insufficient documentation

## 2021-03-04 DIAGNOSIS — Z85820 Personal history of malignant melanoma of skin: Secondary | ICD-10-CM | POA: Diagnosis not present

## 2021-03-04 DIAGNOSIS — Z4682 Encounter for fitting and adjustment of non-vascular catheter: Secondary | ICD-10-CM | POA: Diagnosis not present

## 2021-03-04 DIAGNOSIS — E1151 Type 2 diabetes mellitus with diabetic peripheral angiopathy without gangrene: Secondary | ICD-10-CM | POA: Insufficient documentation

## 2021-03-04 DIAGNOSIS — Z79899 Other long term (current) drug therapy: Secondary | ICD-10-CM | POA: Insufficient documentation

## 2021-03-04 DIAGNOSIS — I2510 Atherosclerotic heart disease of native coronary artery without angina pectoris: Secondary | ICD-10-CM | POA: Diagnosis not present

## 2021-03-04 DIAGNOSIS — Z955 Presence of coronary angioplasty implant and graft: Secondary | ICD-10-CM | POA: Insufficient documentation

## 2021-03-04 DIAGNOSIS — E1169 Type 2 diabetes mellitus with other specified complication: Secondary | ICD-10-CM | POA: Diagnosis not present

## 2021-03-04 DIAGNOSIS — Z7984 Long term (current) use of oral hypoglycemic drugs: Secondary | ICD-10-CM | POA: Diagnosis not present

## 2021-03-04 DIAGNOSIS — Z87891 Personal history of nicotine dependence: Secondary | ICD-10-CM | POA: Diagnosis not present

## 2021-03-04 DIAGNOSIS — I10 Essential (primary) hypertension: Secondary | ICD-10-CM | POA: Insufficient documentation

## 2021-03-04 DIAGNOSIS — E1142 Type 2 diabetes mellitus with diabetic polyneuropathy: Secondary | ICD-10-CM | POA: Diagnosis not present

## 2021-03-04 DIAGNOSIS — Z85038 Personal history of other malignant neoplasm of large intestine: Secondary | ICD-10-CM | POA: Insufficient documentation

## 2021-03-04 MED ORDER — IOHEXOL 300 MG/ML  SOLN
25.0000 mL | Freq: Once | INTRAMUSCULAR | Status: AC | PRN
  Administered 2021-03-04: 25 mL via ORAL

## 2021-03-04 NOTE — ED Triage Notes (Signed)
Patient arrives via EMS from [facility] with complaint of jpeg tube being pulled out for the second time today. Pt is paraplegic, a&ox4.   EMS vitals: BP 142/76 P 110 R 20 O2 98% CBG 185

## 2021-03-04 NOTE — ED Provider Notes (Signed)
[facility] DEPT Provider Note   CSN: [id number] Arrival date & time: 03/04/21  0043     History Chief Complaint  Patient presents with   Pulled out G Tube     Brent [name]. is a 75 y.o. male.  Patient with complicated medical history presents after his PEG tube became dislodged. Resident of [facility] was seen earlier today for same and tube was replaced without difficulty, placement confirmed. The patient does not know how it fell out a second time. No pain.   The history is provided by the patient. No language interpreter was used.      Past Medical History:  Diagnosis Date   Allergy    CAD (coronary artery disease)    a. angioplasty of his RCA in 1990. b. bare metal stent placed in the RCA in 2000 followed by rotational atherectomy shortly after for stent restenosis. c. last cath was in 2012 showing stable moderate diffuse CAD. (70% mid LAD, 80% diagonal, 70% Ramus, 40% mid to distal RCA stent restenosis). d. Low risk nuc in 2015.   Diabetes mellitus    Diverticulosis    Elevated CK    Erectile dysfunction    Hemorrhoids    HTN (hypertension)    Hyperlipidemia    Hypertriglyceridemia    Malignant melanoma of left side of neck ([place]) 10/25/2018   Myocardial infarction ([place])    Obesity    OSA (obstructive sleep apnea)    Persistent disorder of initiating or maintaining sleep    Personal history of colonic polyps 02/05/2003    Patient Active Problem List   Diagnosis Date Noted   Right ischial pressure sore 02/10/2021   Severe sepsis ([place]) 12/22/2020   Sacral decubitus ulcer 12/22/2020   Nausea 12/22/2020   Encounter for monitoring immunomodulating therapy 08/20/2020   High risk medication use 08/20/2020   Psoas abscess, right ([place]) 06/05/2020   Malnutrition of moderate degree 06/04/2020   Lumbar discitis 06/02/2020   Palliative care by specialist    Goals of care, counseling/discussion    FTT (failure to thrive) in adult  06/01/2020   Abdominal pain    Sacral ulcer, with necrosis of muscle ([place]) 04/09/2020   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[place])    Transverse myelitis (H[facility])    Labile blood pressure    Labile blood glucose    Neurogenic bladder    Neurogenic bowel    Acute blood loss anemia    Transaminitis    Controlled type 2 diabetes mellitus with hyperglycemia, with long-term current use of insulin ([place])    Dyslipidemia    HCAP (healthcare-associated pneumonia)    Paraplegia ([place])    Coronary artery disease involving native coronary artery of native heart without angina pectoris    Steroid-induced hyperglycemia    Diabetic peripheral neuropathy ([place])    Hyponatremia    AKI (acute kidney injury) ([place])    Weakness 03/09/2020   Chest pain 03/08/2020   Right leg numbness    Malignant melanoma of left side of neck ([place]) 10/25/2018   Facial neuritis 10/25/2018   Myofascial pain syndrome 10/25/2018   Occipital neuralgia of left side 10/25/2018   CAP (community acquired pneumonia) 09/08/2017   Male hypogonadism 06/20/2016   Renal lesion 06/20/2016   Insomnia 08/07/2015   Enlarged prostate with lower urinary tract symptoms (LUTS) 11/07/2014   Balanitis 07/30/2012   Bladder neck obstruction 07/30/2012   Prostate nodule 06/18/2012   Recurrent nephrolithiasis 06/18/2012   Benign neoplasm of colon 08/29/2011  DEPRESSION, SITUATIONAL, ACUTE 01/23/2010   Essential hypertension 05/30/2009   Coronary atherosclerosis 05/30/2009   Obstructive sleep apnea 11/28/2008   OBESITY 09/23/2008   ERECTILE DYSFUNCTION 11/26/2007   Well controlled type 2 diabetes mellitus with peripheral circulatory disorder ([place]) 05/09/2007   Hyperlipidemia 05/09/2007   MYOCARDIAL INFARCTION, HX OF 05/09/2007   DIVERTICULOSIS, COLON 05/09/2007    Past Surgical History:  Procedure Laterality Date   CHOLECYSTECTOMY     CORONARY ANGIOPLASTY     CORONARY STENT PLACEMENT     stenting of the right coronary artery with followup rotational   atherectomy. (3 stents placed)   FINGER SURGERY     right   FOOT SURGERY     right   INGUINAL HERNIA REPAIR     right   IRRIGATION AND DEBRIDEMENT ABSCESS N/A 06/04/2020   Procedure: IRRIGATION AND DEBRIDEMENT SACRAL WOUND;  Surgeon: Jesusita Oka, MD;  Location: [facility];  Service: General;  Laterality: N/A;   LAPAROSCOPY N/A 06/04/2020   Procedure: LAPAROSCOPY ASSISTED COLOSTOMY, LAPAROSCOPY GASTROSTOMY;  Surgeon: Jesusita Oka, MD;  Location: [facility];  Service: General;  Laterality: N/A;   LYSIS OF ADHESION N/A 06/04/2020   Procedure: LYSIS OF ADHESION;  Surgeon: Jesusita Oka, MD;  Location: [facility];  Service: General;  Laterality: N/A;   melanoma removal     neck   ORTHOPEDIC SURGERY     foot right   rotator cuff surg     Bil       Family History  Problem Relation Age of Onset   Liver cancer Father    Lung cancer Father    Heart disease Father    Heart disease Sister    Lung cancer Mother    COPD Mother    Obesity Mother    Colon cancer Neg Hx    Stomach cancer Neg Hx    Pancreatic cancer Neg Hx     Social History   Tobacco Use   Smoking status: Former    Packs/day: 1.50    Years: 30.00    Pack years: 45.00    Types: Cigarettes    Quit date: 09/19/1980    Years since quitting: 40.4   Smokeless tobacco: Never  Vaping Use   Vaping Use: Never used  Substance Use Topics   Alcohol use: Not Currently    Alcohol/week: 7.0 - 14.0 standard drinks    Types: 7 - 14 Shots of liquor per week   Drug use: No    Home Medications Prior to Admission medications   Medication Sig Start Date End Date Taking? Authorizing Provider  acetaminophen (TYLENOL) 325 MG tablet Take 2 tablets (650 mg total) by mouth every 6 (six) hours as needed for headache. 04/24/20   Love, Ivan Anchors, PA-C  albuterol (PROVENTIL) (2.5 MG/3ML) 0.083% nebulizer solution Take 3 mLs (2.5 mg total) by nebulization once as needed for wheezing or shortness of breath (or anaphylaxis due to Rituxan  infusion). Patient taking differently: Take 2.5 mg by nebulization every 6 (six) hours as needed for wheezing or shortness of breath (or anaphylaxis due to Rituxan infusion). 04/24/20   Love, Ivan Anchors, PA-C  Amino Acids-Protein Hydrolys (FEEDING SUPPLEMENT, PRO-STAT SUGAR FREE 64,) LIQD Take 30 mLs by mouth in the morning and at bedtime.    [provider]  antiseptic oral rinse (BIOTENE) LIQD 10 mLs by Mouth Rinse route in the morning, at noon, and at bedtime. Swish & Spit    [provider]  Ascorbic Acid (VITA-C PO)  Take 500 mg by mouth daily.    [provider]  aspirin EC 81 MG tablet Take 81 mg by mouth daily. Swallow whole.    [provider]  atorvastatin (LIPITOR) 20 MG tablet Take 1 tablet (20 mg total) by mouth daily. Patient taking differently: Take 20 mg by mouth at bedtime. 03/26/20   Shelly Coss, MD  Baclofen 5 MG TABS Take 1 tablet by mouth at bedtime. 02/25/21   [provider]  collagenase (SANTYL) ointment Apply topically daily. Patient not taking: No sig reported 11/23/20   Dana Allan I, MD  dronabinol (MARINOL) 2.5 MG capsule Take 2.5 mg by mouth in the morning and at bedtime. 01/28/21   [provider]  ferrous gluconate (FERGON) 324 MG tablet Take 324 mg by mouth 2 (two) times daily with a meal.    [provider]  fluticasone (FLONASE) 50 MCG/ACT nasal spray Place 2 sprays into both nostrils 2 (two) times daily. Patient taking differently: Place 1 spray into both nostrils daily. 04/24/20   Love, Ivan Anchors, PA-C  gabapentin (NEURONTIN) 300 MG capsule Place 1 capsule (300 mg total) into feeding tube at bedtime. Patient not taking: No sig reported 06/10/20   Rai, Vernelle Emerald, MD  Icosapent Ethyl (VASCEPA) 1 g CAPS Take 2 capsules (2 g total) by mouth 2 (two) times daily. Patient not taking: Reported on 02/26/2021 07/19/19   Charlie Pitter, PA-C  insulin aspart (NOVOLOG) 100 UNIT/ML injection Inject 0-15 Units into the  skin every 4 (four) hours. Sliding scale  CBG 70 - 120: 0 units: CBG 121 - 150: 2 units; CBG 151 - 200: 3 units; CBG 201 - 250: 5 units; CBG 251 - 300: 8 units;CBG 301 - 350: 11 units; CBG 351 - 400: 15 units; CBG > 400 : 15 units and notify MD Patient taking differently: Inject 0-15 Units into the skin See admin instructions. Sliding scale  If CBG>70 Call NP/PA, CBG 70 - 120: 0 units: CBG 121 - 150: 2 units; CBG 151 - 200: 3 units; CBG 201 - 250: 5 units; CBG 251 - 300: 8 units; CBG 301 - 350: 11 units; CBG 351 - 400: 15units and  CBG >400: 15 Units, CBG>400 Call MD 06/10/20   Rai, Ripudeep K, MD  insulin glargine (LANTUS) 100 unit/mL SOPN Inject 15 Units into the skin at bedtime. Patient taking differently: Inject 23 Units into the skin 2 (two) times daily. 06/10/20   Rai, Ripudeep K, MD  Lidocaine 4 % PTCH Apply 1 patch topically 2 (two) times daily. Apply to right hip in the morning remove at night    [provider]  lisinopril (ZESTRIL) 20 MG tablet Take 1 tablet by mouth daily. 01/01/21   [provider]  magnesium hydroxide (MILK OF MAGNESIA) 400 MG/5ML suspension Take 30 mLs by mouth daily as needed for mild constipation.    [provider]  Menthol (ICY HOT ADVANCED RELIEF) 7.5 % PTCH Apply 1 patch topically daily.    [provider]  Menthol, Topical Analgesic, (BIOFREEZE) 4 % GEL Apply 1 application topically in the morning, at noon, and at bedtime.     [provider]  metFORMIN (GLUCOPHAGE) 500 MG tablet Take 250 mg by mouth 2 (two) times daily with a meal.    [provider]  methenamine (HIPREX) 1 g tablet Take 1 g by mouth 2 (two) times daily. 02/22/21   [provider]  Nutritional Supplements (FEEDING SUPPLEMENT, GLUCERNA 1.5 CAL,)  LIQD Place 75 mLs into feeding tube continuous. 75 ml/hr    [provider]  pantoprazole (PROTONIX) 40 MG tablet Take 40 mg by mouth 2 (two) times daily. 10/19/20   [provider]   polyvinyl alcohol (LIQUIFILM TEARS) 1.4 % ophthalmic solution Place 2 drops into both eyes 2 (two) times daily as needed for dry eyes.    [provider]  PSYLLIUM PO Take 0.4 g by mouth in the morning and at bedtime.    [provider]  senna-docusate (SENOKOT-S) 8.6-50 MG tablet Take 2 tablets by mouth daily.    [provider]  sertraline (ZOLOFT) 50 MG tablet Take 50 mg by mouth at bedtime. 11/10/20   [provider]  tamsulosin (FLOMAX) 0.4 MG CAPS capsule Take 1 capsule (0.4 mg total) by mouth daily after supper. Patient not taking: Reported on 02/26/2021 06/10/20   Rai, Vernelle Emerald, MD  traMADol (ULTRAM) 50 MG tablet Take 50 mg by mouth 2 (two) times daily as needed for moderate pain.    [provider]  traZODone (DESYREL) 50 MG tablet Take 50 mg by mouth at bedtime.    [provider]  vitamin B-12 (CYANOCOBALAMIN) 500 MCG tablet Take 500 mcg by mouth daily.    [provider]  zinc sulfate 220 (50 Zn) MG capsule Place 1 capsule (220 mg total) into feeding tube daily. Patient not taking: No sig reported 06/10/20   Rai, Vernelle Emerald, MD    Allergies    Levofloxacin and Penicillins  Review of Systems   Review of Systems  Constitutional:  Negative for fever.  Gastrointestinal:        See HPI.  Skin:  Negative for color change.   Physical Exam Updated Vital Signs BP (!) 111/52 (BP Location: Left Arm)   Pulse 74   Temp 97.9 F (36.6 C) (Oral)   Resp 17   SpO2 100%   Physical Exam Constitutional:      Appearance: Normal appearance. He is well-developed.  Pulmonary:     Effort: Pulmonary effort is normal.  Abdominal:     Comments: Abdomen soft. Colostomy in LLQ. G-tube absent. There is no purulent drainage or bleeding at the site. Covering gauze saturated with what appears c/w gastric fluid.   Musculoskeletal:        General: Normal range of motion.     Cervical back: Normal range of motion.  Skin:    General: Skin  is warm and dry.  Neurological:     Mental Status: He is alert and oriented to person, place, and time.    ED Results / Procedures / Treatments   Labs (all labs ordered are listed, but only abnormal results are displayed) Labs Reviewed - No data to display  EKG None  Radiology DG Abdomen 1 View  Result Date: 03/03/2021 CLINICAL DATA:  Gastrostomy catheter placement EXAM: ABDOMEN - 1 VIEW COMPARISON:  November 21, 2020 FINDINGS: Contrast was administered through a gastrostomy catheter. The catheter is position within stomach. Contrast flows freely from the stomach into the duodenum. No contrast extravasation. There is no bowel dilatation or air-fluid level to suggest bowel obstruction. No free air evident on this supine examination. There are surgical clips in the right quadrant. IMPRESSION: Gastrostomy catheter positioned within the stomach. No contrast extravasation. Contrast flows freely from stomach into duodenum. No bowel obstruction or free air evident. Electronically Signed   By: Lowella Grip III M.D.   On: 03/03/2021 14:44    Procedures Gastrostomy  tube replacement  Date/Time: 03/04/2021 2:09 AM Performed by: Charlann Lange, PA-C Authorized by: Charlann Lange, PA-C  Consent: Verbal consent obtained. Consent given by: patient Patient understanding: patient states understanding of the procedure being performed Patient identity confirmed: verbally with patient Time out: Immediately prior to procedure a "time out" was called to verify the correct patient, procedure, equipment, support staff and site/side marked as required. Preparation: Patient was prepped and draped in the usual sterile fashion. Local anesthesia used: no  Anesthesia: Local anesthesia used: no  Sedation: Patient sedated: no  Patient tolerance: patient tolerated the procedure well with no immediate complications Comments: Gastric tube 16 Fr easily replaced. Balloon expanded to 5 ml. There is gastric fluid in  the tube that is clear, yellow, not cloudy. No foreign material. No bleeding.    Medications Ordered in ED Medications - No data to display  ED Course  I have reviewed the triage vital signs and the nursing notes.  Pertinent labs & imaging results that were available during my care of the patient were reviewed by me and considered in my medical decision making (see chart for details).    MDM Rules/Calculators/A&P                          Patient to ED for replacement of gastric tube after it became dislodged for 2nd time today.   Tube easily replaced. Confirmation imaging pending.   Tube in proper placement. Discharge back to facility.  Final Clinical Impression(s) / ED Diagnoses Final diagnoses:  None   Dislodged gastrostomy tube   Rx / DC Orders ED Discharge Orders     None        Charlann Lange, PA-C 03/04/21 0240    Veryl Speak, MD 03/04/21 [phone]

## 2021-03-04 NOTE — ED Notes (Signed)
[facility] contacted, transport arranged for patient back to [facility] and [facility].

## 2021-03-04 NOTE — Discharge Instructions (Addendum)
Gastrostomy tube successfully replaced. Balloon expanded with 5 ml, placement confirmed on imaging.

## 2021-03-05 DIAGNOSIS — E1165 Type 2 diabetes mellitus with hyperglycemia: Secondary | ICD-10-CM | POA: Diagnosis not present

## 2021-03-05 DIAGNOSIS — K9420 Gastrostomy complication, unspecified: Secondary | ICD-10-CM | POA: Diagnosis not present

## 2021-03-05 DIAGNOSIS — Z431 Encounter for attention to gastrostomy: Secondary | ICD-10-CM | POA: Diagnosis not present

## 2021-03-08 DIAGNOSIS — R2681 Unsteadiness on feet: Secondary | ICD-10-CM | POA: Diagnosis not present

## 2021-03-08 DIAGNOSIS — G8250 Quadriplegia, unspecified: Secondary | ICD-10-CM | POA: Diagnosis not present

## 2021-03-08 DIAGNOSIS — M6259 Muscle wasting and atrophy, not elsewhere classified, multiple sites: Secondary | ICD-10-CM | POA: Diagnosis not present

## 2021-03-08 DIAGNOSIS — R278 Other lack of coordination: Secondary | ICD-10-CM | POA: Diagnosis not present

## 2021-03-08 DIAGNOSIS — M6281 Muscle weakness (generalized): Secondary | ICD-10-CM | POA: Diagnosis not present

## 2021-03-08 DIAGNOSIS — A419 Sepsis, unspecified organism: Secondary | ICD-10-CM | POA: Diagnosis not present

## 2021-03-08 DIAGNOSIS — Z7409 Other reduced mobility: Secondary | ICD-10-CM | POA: Diagnosis not present

## 2021-03-08 DIAGNOSIS — R41841 Cognitive communication deficit: Secondary | ICD-10-CM | POA: Diagnosis not present

## 2021-03-08 DIAGNOSIS — G373 Acute transverse myelitis in demyelinating disease of central nervous system: Secondary | ICD-10-CM | POA: Diagnosis not present

## 2021-03-08 DIAGNOSIS — R532 Functional quadriplegia: Secondary | ICD-10-CM | POA: Diagnosis not present

## 2021-03-08 DIAGNOSIS — M6258 Muscle wasting and atrophy, not elsewhere classified, other site: Secondary | ICD-10-CM | POA: Diagnosis not present

## 2021-03-08 DIAGNOSIS — R1312 Dysphagia, oropharyngeal phase: Secondary | ICD-10-CM | POA: Diagnosis not present

## 2021-03-09 ENCOUNTER — Telehealth: Payer: Self-pay | Admitting: Gastroenterology

## 2021-03-09 DIAGNOSIS — M8618 Other acute osteomyelitis, other site: Secondary | ICD-10-CM | POA: Diagnosis not present

## 2021-03-09 DIAGNOSIS — L89214 Pressure ulcer of right hip, stage 4: Secondary | ICD-10-CM | POA: Diagnosis not present

## 2021-03-09 DIAGNOSIS — L89154 Pressure ulcer of sacral region, stage 4: Secondary | ICD-10-CM | POA: Diagnosis not present

## 2021-03-09 DIAGNOSIS — L89623 Pressure ulcer of left heel, stage 3: Secondary | ICD-10-CM | POA: Diagnosis not present

## 2021-03-09 NOTE — Telephone Encounter (Signed)
Hey Dr. Loletha Carrow,   We received a call from [place], scheduler at [facility]. Since last visit in 2020, patient is now stretcher bound. He needs an office visit for chronic anorexia, anemia, and nausea. Since he is in a stretcher, he is transported by ambulance. [place] stated there is no guaranteed a CNA can come with him because they are not allowed on the ambulance with the patient but could possibly drive.   Could you please advise on the protocol on how you want to proceed with scheduling patient?  Thank you

## 2021-03-10 DIAGNOSIS — M6259 Muscle wasting and atrophy, not elsewhere classified, multiple sites: Secondary | ICD-10-CM | POA: Diagnosis not present

## 2021-03-10 DIAGNOSIS — T85528A Displacement of other gastrointestinal prosthetic devices, implants and grafts, initial encounter: Secondary | ICD-10-CM | POA: Diagnosis not present

## 2021-03-10 DIAGNOSIS — R0989 Other specified symptoms and signs involving the circulatory and respiratory systems: Secondary | ICD-10-CM | POA: Diagnosis not present

## 2021-03-10 NOTE — Telephone Encounter (Signed)
Spoke with [place] Dec, the provider that cares for patient is as follows:  Dr. Jules Husbands  Phone: [phone] Email: [email or web address]

## 2021-03-10 NOTE — Telephone Encounter (Signed)
Please provide me with contact information for whatever provider cares for him at his facility so we can discuss the case.  Chart review indicates this patient is likely too medically complicated for any outpatient endoscopic testing.  So I need to speak with his provider before we put this man and his staff through the trouble of an office visit.  Thank you  - HD

## 2021-03-11 DIAGNOSIS — K9420 Gastrostomy complication, unspecified: Secondary | ICD-10-CM | POA: Diagnosis not present

## 2021-03-11 DIAGNOSIS — Z431 Encounter for attention to gastrostomy: Secondary | ICD-10-CM | POA: Diagnosis not present

## 2021-03-11 DIAGNOSIS — Z4659 Encounter for fitting and adjustment of other gastrointestinal appliance and device: Secondary | ICD-10-CM | POA: Diagnosis not present

## 2021-03-11 DIAGNOSIS — E1165 Type 2 diabetes mellitus with hyperglycemia: Secondary | ICD-10-CM | POA: Diagnosis not present

## 2021-03-11 DIAGNOSIS — L89154 Pressure ulcer of sacral region, stage 4: Secondary | ICD-10-CM | POA: Diagnosis not present

## 2021-03-11 DIAGNOSIS — R0989 Other specified symptoms and signs involving the circulatory and respiratory systems: Secondary | ICD-10-CM | POA: Diagnosis not present

## 2021-03-11 DIAGNOSIS — E875 Hyperkalemia: Secondary | ICD-10-CM | POA: Diagnosis not present

## 2021-03-11 DIAGNOSIS — D508 Other iron deficiency anemias: Secondary | ICD-10-CM | POA: Diagnosis not present

## 2021-03-11 DIAGNOSIS — T839XXD Unspecified complication of genitourinary prosthetic device, implant and graft, subsequent encounter: Secondary | ICD-10-CM | POA: Diagnosis not present

## 2021-03-11 NOTE — Telephone Encounter (Signed)
Thank you for the contact information, I was able to speak with Dr. Keenan Bachelor today about this patient.  Dr. Keenan Bachelor is considering hospital admission for this patient due to his multiple complex issues and recurrent displacement of G-tube, and she will call me back as needed.  No office appointment needed at this time.  - HD

## 2021-03-14 ENCOUNTER — Other Ambulatory Visit: Payer: Self-pay

## 2021-03-14 ENCOUNTER — Encounter (HOSPITAL_COMMUNITY): Payer: Self-pay | Admitting: Emergency Medicine

## 2021-03-14 ENCOUNTER — Emergency Department (HOSPITAL_COMMUNITY)
Admission: EM | Admit: 2021-03-14 | Discharge: 2021-03-14 | Disposition: A | Discharge status: Home / Self Care | Payer: Medicare Other | Attending: Emergency Medicine | Admitting: Emergency Medicine

## 2021-03-14 ENCOUNTER — Emergency Department (HOSPITAL_COMMUNITY): Payer: Medicare Other

## 2021-03-14 DIAGNOSIS — K9423 Gastrostomy malfunction: Secondary | ICD-10-CM | POA: Diagnosis not present

## 2021-03-14 DIAGNOSIS — Z85828 Personal history of other malignant neoplasm of skin: Secondary | ICD-10-CM | POA: Insufficient documentation

## 2021-03-14 DIAGNOSIS — I959 Hypotension, unspecified: Secondary | ICD-10-CM | POA: Diagnosis not present

## 2021-03-14 DIAGNOSIS — Z87891 Personal history of nicotine dependence: Secondary | ICD-10-CM | POA: Insufficient documentation

## 2021-03-14 DIAGNOSIS — I2510 Atherosclerotic heart disease of native coronary artery without angina pectoris: Secondary | ICD-10-CM | POA: Insufficient documentation

## 2021-03-14 DIAGNOSIS — Z4682 Encounter for fitting and adjustment of non-vascular catheter: Secondary | ICD-10-CM | POA: Diagnosis not present

## 2021-03-14 DIAGNOSIS — Z743 Need for continuous supervision: Secondary | ICD-10-CM | POA: Diagnosis not present

## 2021-03-14 DIAGNOSIS — S3991XA Unspecified injury of abdomen, initial encounter: Secondary | ICD-10-CM | POA: Diagnosis not present

## 2021-03-14 DIAGNOSIS — Z79899 Other long term (current) drug therapy: Secondary | ICD-10-CM | POA: Insufficient documentation

## 2021-03-14 DIAGNOSIS — Z7984 Long term (current) use of oral hypoglycemic drugs: Secondary | ICD-10-CM | POA: Diagnosis not present

## 2021-03-14 DIAGNOSIS — Z7982 Long term (current) use of aspirin: Secondary | ICD-10-CM | POA: Diagnosis not present

## 2021-03-14 DIAGNOSIS — Z794 Long term (current) use of insulin: Secondary | ICD-10-CM | POA: Insufficient documentation

## 2021-03-14 DIAGNOSIS — E1140 Type 2 diabetes mellitus with diabetic neuropathy, unspecified: Secondary | ICD-10-CM | POA: Diagnosis not present

## 2021-03-14 DIAGNOSIS — Z85030 Personal history of malignant carcinoid tumor of large intestine: Secondary | ICD-10-CM | POA: Insufficient documentation

## 2021-03-14 DIAGNOSIS — I10 Essential (primary) hypertension: Secondary | ICD-10-CM | POA: Diagnosis not present

## 2021-03-14 DIAGNOSIS — Z931 Gastrostomy status: Secondary | ICD-10-CM | POA: Diagnosis not present

## 2021-03-14 NOTE — ED Notes (Signed)
Attempted report to [place], no answer.

## 2021-03-14 NOTE — ED Notes (Signed)
[facility] notified of need for transport back to [facility]

## 2021-03-14 NOTE — ED Triage Notes (Signed)
Patient here from [facility] reporting peg tube came out after staff changed dressing. Tube at bedside, reports this is the third time. AAO x4.

## 2021-03-14 NOTE — ED Notes (Signed)
PTAR at bedside 

## 2021-03-14 NOTE — ED Provider Notes (Signed)
[facility] DEPT Provider Note   CSN: [id number] Arrival date & time: 03/14/21  1551     History Chief Complaint  Patient presents with   Peg Tube Out    Brent [name]. is a 75 y.o. male.  The history is provided by the patient and medical records. No language interpreter was used.   75 year old male with extensive medical history who presents after his PEG tube became dislodged.  Patient states today while he was at his living facility at [facility], he had a dressing that needs to be replace and when the staff turned him over, it may have dislodged the PEG tube.  Since then it has leaking out moderate amount of fluid but he denies any significant abdominal pain no fever.  States that this is the fifth time that his PEG tube has been falling out of place.  Past Medical History:  Diagnosis Date   Allergy    CAD (coronary artery disease)    a. angioplasty of his RCA in 1990. b. bare metal stent placed in the RCA in 2000 followed by rotational atherectomy shortly after for stent restenosis. c. last cath was in 2012 showing stable moderate diffuse CAD. (70% mid LAD, 80% diagonal, 70% Ramus, 40% mid to distal RCA stent restenosis). d. Low risk nuc in 2015.   Diabetes mellitus    Diverticulosis    Elevated CK    Erectile dysfunction    Hemorrhoids    HTN (hypertension)    Hyperlipidemia    Hypertriglyceridemia    Malignant melanoma of left side of neck ([place]) 10/25/2018   Myocardial infarction ([place])    Obesity    OSA (obstructive sleep apnea)    Persistent disorder of initiating or maintaining sleep    Personal history of colonic polyps 02/05/2003    Patient Active Problem List   Diagnosis Date Noted   Right ischial pressure sore 02/10/2021   Severe sepsis ([place]) 12/22/2020   Sacral decubitus ulcer 12/22/2020   Nausea 12/22/2020   Encounter for monitoring immunomodulating therapy 08/20/2020   High risk medication use 08/20/2020   Psoas  abscess, right ([place]) 06/05/2020   Malnutrition of moderate degree 06/04/2020   Lumbar discitis 06/02/2020   Palliative care by specialist    Goals of care, counseling/discussion    FTT (failure to thrive) in adult 06/01/2020   Abdominal pain    Sacral ulcer, with necrosis of muscle ([place]) 04/09/2020   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[place])    Transverse myelitis (H[facility])    Labile blood pressure    Labile blood glucose    Neurogenic bladder    Neurogenic bowel    Acute blood loss anemia    Transaminitis    Controlled type 2 diabetes mellitus with hyperglycemia, with long-term current use of insulin ([place])    Dyslipidemia    HCAP (healthcare-associated pneumonia)    Paraplegia ([place])    Coronary artery disease involving native coronary artery of native heart without angina pectoris    Steroid-induced hyperglycemia    Diabetic peripheral neuropathy ([place])    Hyponatremia    AKI (acute kidney injury) ([place])    Weakness 03/09/2020   Chest pain 03/08/2020   Right leg numbness    Malignant melanoma of left side of neck ([place]) 10/25/2018   Facial neuritis 10/25/2018   Myofascial pain syndrome 10/25/2018   Occipital neuralgia of left side 10/25/2018   CAP (community acquired pneumonia) 09/08/2017   Male hypogonadism 06/20/2016   Renal lesion 06/20/2016  Insomnia 08/07/2015   Enlarged prostate with lower urinary tract symptoms (LUTS) 11/07/2014   Balanitis 07/30/2012   Bladder neck obstruction 07/30/2012   Prostate nodule 06/18/2012   Recurrent nephrolithiasis 06/18/2012   Benign neoplasm of colon 08/29/2011   DEPRESSION, SITUATIONAL, ACUTE 01/23/2010   Essential hypertension 05/30/2009   Coronary atherosclerosis 05/30/2009   Obstructive sleep apnea 11/28/2008   OBESITY 09/23/2008   ERECTILE DYSFUNCTION 11/26/2007   Well controlled type 2 diabetes mellitus with peripheral circulatory disorder ([place]) 05/09/2007   Hyperlipidemia 05/09/2007   MYOCARDIAL INFARCTION, HX OF 05/09/2007    DIVERTICULOSIS, COLON 05/09/2007    Past Surgical History:  Procedure Laterality Date   CHOLECYSTECTOMY     CORONARY ANGIOPLASTY     CORONARY STENT PLACEMENT     stenting of the right coronary artery with followup rotational  atherectomy. (3 stents placed)   FINGER SURGERY     right   FOOT SURGERY     right   INGUINAL HERNIA REPAIR     right   IRRIGATION AND DEBRIDEMENT ABSCESS N/A 06/04/2020   Procedure: IRRIGATION AND DEBRIDEMENT SACRAL WOUND;  Surgeon: Jesusita Oka, MD;  Location: [facility];  Service: General;  Laterality: N/A;   LAPAROSCOPY N/A 06/04/2020   Procedure: LAPAROSCOPY ASSISTED COLOSTOMY, LAPAROSCOPY GASTROSTOMY;  Surgeon: Jesusita Oka, MD;  Location: [facility];  Service: General;  Laterality: N/A;   LYSIS OF ADHESION N/A 06/04/2020   Procedure: LYSIS OF ADHESION;  Surgeon: Jesusita Oka, MD;  Location: [facility];  Service: General;  Laterality: N/A;   melanoma removal     neck   ORTHOPEDIC SURGERY     foot right   rotator cuff surg     Bil       Family History  Problem Relation Age of Onset   Liver cancer Father    Lung cancer Father    Heart disease Father    Heart disease Sister    Lung cancer Mother    COPD Mother    Obesity Mother    Colon cancer Neg Hx    Stomach cancer Neg Hx    Pancreatic cancer Neg Hx     Social History   Tobacco Use   Smoking status: Former    Packs/day: 1.50    Years: 30.00    Pack years: 45.00    Types: Cigarettes    Quit date: 09/19/1980    Years since quitting: 40.5   Smokeless tobacco: Never  Vaping Use   Vaping Use: Never used  Substance Use Topics   Alcohol use: Not Currently    Alcohol/week: 7.0 - 14.0 standard drinks    Types: 7 - 14 Shots of liquor per week   Drug use: No    Home Medications Prior to Admission medications   Medication Sig Start Date End Date Taking? Authorizing Provider  acetaminophen (TYLENOL) 325 MG tablet Take 2 tablets (650 mg total) by mouth every 6 (six) hours as needed for  headache. 04/24/20   Love, Ivan Anchors, PA-C  albuterol (PROVENTIL) (2.5 MG/3ML) 0.083% nebulizer solution Take 3 mLs (2.5 mg total) by nebulization once as needed for wheezing or shortness of breath (or anaphylaxis due to Rituxan infusion). Patient taking differently: Take 2.5 mg by nebulization every 6 (six) hours as needed for wheezing or shortness of breath (or anaphylaxis due to Rituxan infusion). 04/24/20   Love, Ivan Anchors, PA-C  Amino Acids-Protein Hydrolys (FEEDING SUPPLEMENT, PRO-STAT SUGAR FREE 64,) LIQD Take 30 mLs by mouth in the morning and  at bedtime.    [provider]  antiseptic oral rinse (BIOTENE) LIQD 10 mLs by Mouth Rinse route in the morning, at noon, and at bedtime. Swish & Spit    [provider]  Ascorbic Acid (VITA-C PO) Take 500 mg by mouth daily.    [provider]  aspirin EC 81 MG tablet Take 81 mg by mouth daily. Swallow whole.    [provider]  atorvastatin (LIPITOR) 20 MG tablet Take 1 tablet (20 mg total) by mouth daily. Patient taking differently: Take 20 mg by mouth at bedtime. 03/26/20   Shelly Coss, MD  Baclofen 5 MG TABS Take 1 tablet by mouth at bedtime. 02/25/21   [provider]  collagenase (SANTYL) ointment Apply topically daily. Patient not taking: No sig reported 11/23/20   Dana Allan I, MD  dronabinol (MARINOL) 2.5 MG capsule Take 2.5 mg by mouth in the morning and at bedtime. 01/28/21   [provider]  ferrous gluconate (FERGON) 324 MG tablet Take 324 mg by mouth 2 (two) times daily with a meal.    [provider]  fluticasone (FLONASE) 50 MCG/ACT nasal spray Place 2 sprays into both nostrils 2 (two) times daily. Patient taking differently: Place 1 spray into both nostrils daily. 04/24/20   Love, Ivan Anchors, PA-C  gabapentin (NEURONTIN) 300 MG capsule Place 1 capsule (300 mg total) into feeding tube at bedtime. Patient not taking: No sig reported 06/10/20   Rai, Vernelle Emerald, MD  Icosapent Ethyl  (VASCEPA) 1 g CAPS Take 2 capsules (2 g total) by mouth 2 (two) times daily. Patient not taking: Reported on 02/26/2021 07/19/19   Charlie Pitter, PA-C  insulin aspart (NOVOLOG) 100 UNIT/ML injection Inject 0-15 Units into the skin every 4 (four) hours. Sliding scale  CBG 70 - 120: 0 units: CBG 121 - 150: 2 units; CBG 151 - 200: 3 units; CBG 201 - 250: 5 units; CBG 251 - 300: 8 units;CBG 301 - 350: 11 units; CBG 351 - 400: 15 units; CBG > 400 : 15 units and notify MD Patient taking differently: Inject 0-15 Units into the skin See admin instructions. Sliding scale  If CBG>70 Call NP/PA, CBG 70 - 120: 0 units: CBG 121 - 150: 2 units; CBG 151 - 200: 3 units; CBG 201 - 250: 5 units; CBG 251 - 300: 8 units; CBG 301 - 350: 11 units; CBG 351 - 400: 15units and  CBG >400: 15 Units, CBG>400 Call MD 06/10/20   Rai, Ripudeep K, MD  insulin glargine (LANTUS) 100 unit/mL SOPN Inject 15 Units into the skin at bedtime. Patient taking differently: Inject 23 Units into the skin 2 (two) times daily. 06/10/20   Rai, Ripudeep K, MD  Lidocaine 4 % PTCH Apply 1 patch topically 2 (two) times daily. Apply to right hip in the morning remove at night    [provider]  lisinopril (ZESTRIL) 20 MG tablet Take 1 tablet by mouth daily. 01/01/21   [provider]  magnesium hydroxide (MILK OF MAGNESIA) 400 MG/5ML suspension Take 30 mLs by mouth daily as needed for mild constipation.    [provider]  Menthol (ICY HOT ADVANCED RELIEF) 7.5 % PTCH Apply 1 patch topically daily.    [provider]  Menthol, Topical Analgesic, (BIOFREEZE) 4 % GEL Apply 1 application topically in the morning, at noon, and at bedtime.     [provider]  metFORMIN (GLUCOPHAGE) 500 MG tablet Take 250 mg by mouth  2 (two) times daily with a meal.    [provider]  methenamine (HIPREX) 1 g tablet Take 1 g by mouth 2 (two) times daily. 02/22/21   [provider]  Nutritional Supplements (FEEDING  SUPPLEMENT, GLUCERNA 1.5 CAL,) LIQD Place 75 mLs into feeding tube continuous. 75 ml/hr    [provider]  pantoprazole (PROTONIX) 40 MG tablet Take 40 mg by mouth 2 (two) times daily. 10/19/20   [provider]  polyvinyl alcohol (LIQUIFILM TEARS) 1.4 % ophthalmic solution Place 2 drops into both eyes 2 (two) times daily as needed for dry eyes.    [provider]  PSYLLIUM PO Take 0.4 g by mouth in the morning and at bedtime.    [provider]  senna-docusate (SENOKOT-S) 8.6-50 MG tablet Take 2 tablets by mouth daily.    [provider]  sertraline (ZOLOFT) 50 MG tablet Take 50 mg by mouth at bedtime. 11/10/20   [provider]  tamsulosin (FLOMAX) 0.4 MG CAPS capsule Take 1 capsule (0.4 mg total) by mouth daily after supper. Patient not taking: Reported on 02/26/2021 06/10/20   Rai, Vernelle Emerald, MD  traMADol (ULTRAM) 50 MG tablet Take 50 mg by mouth 2 (two) times daily as needed for moderate pain.    [provider]  traZODone (DESYREL) 50 MG tablet Take 50 mg by mouth at bedtime.    [provider]  vitamin B-12 (CYANOCOBALAMIN) 500 MCG tablet Take 500 mcg by mouth daily.    [provider]  zinc sulfate 220 (50 Zn) MG capsule Place 1 capsule (220 mg total) into feeding tube daily. Patient not taking: No sig reported 06/10/20   Rai, Vernelle Emerald, MD    Allergies    Levofloxacin and Penicillins  Review of Systems   Review of Systems  Constitutional:  Negative for fever.  Gastrointestinal:  Negative for abdominal pain.   Physical Exam Updated Vital Signs BP 135/61   Pulse 75   Resp 17   SpO2 99%   Physical Exam Vitals and nursing note reviewed.  Constitutional:      General: He is not in acute distress.    Appearance: He is well-developed.     Comments: Cachectic appearing elderly male appears to be in no acute distress  HENT:     Head: Atraumatic.  Eyes:     Conjunctiva/sclera: Conjunctivae normal.   Abdominal:     Comments: Abdomen nontender.  G-tube is absent.  Gastric fluid oozing from ostomy.  Colostomy bag noted to the lower abdomen with normal appearance  Musculoskeletal:     Cervical back: Neck supple.  Skin:    Findings: No rash.  Neurological:     Mental Status: He is alert.    ED Results / Procedures / Treatments   Labs (all labs ordered are listed, but only abnormal results are displayed) Labs Reviewed - No data to display  EKG None  Radiology DG ABDOMEN PEG TUBE LOCATION  Result Date: 03/14/2021 CLINICAL DATA:  Peg tube replacement. EXAM: ABDOMEN - 1 VIEW COMPARISON:  Abdominal x-ray dated March 04, 2021. FINDINGS: Contrast injection through the gastrostomy tube confirms intraluminal location within the stomach. No contrast extravasation. The bowel gas pattern is normal. No radio-opaque calculi or other significant radiographic abnormality are seen. IMPRESSION: 1. Appropriately positioned gastrostomy tube. Electronically Signed   By: Titus Dubin M.D.   On: 03/14/2021 17:49    Procedures Gastrostomy tube replacement  Date/Time: 03/14/2021 5:03 PM Performed by: Domenic Moras,  PA-C Authorized by: Domenic Moras, PA-C  Consent: Verbal consent obtained. Risks and benefits: risks, benefits and alternatives were discussed Consent given by: patient Patient understanding: patient states understanding of the procedure being performed Patient identity confirmed: verbally with patient and arm band Time out: Immediately prior to procedure a "time out" was called to verify the correct patient, procedure, equipment, support staff and site/side marked as required. Local anesthesia used: no  Anesthesia: Local anesthesia used: no  Sedation: Patient sedated: no  Patient tolerance: patient tolerated the procedure well with no immediate complications Comments: Replacement of gastrostomy tube, 16 [place]     Medications Ordered in ED Medications - No data to display  ED  Course  I have reviewed the triage vital signs and the nursing notes.  Pertinent labs & imaging results that were available during my care of the patient were reviewed by me and considered in my medical decision making (see chart for details).    MDM Rules/Calculators/A&P                          BP 135/61   Pulse 75   Resp 17   SpO2 99%   Final Clinical Impression(s) / ED Diagnoses Final diagnoses:  Gastrostomy tube in place [facility])    Rx / DC Orders ED Discharge Orders     None      4:14 PM Patient's PEG tube dislodged today when he was turned in bed to have a dressing change.  This is the fifth time that this has happened.  Will benefit from replacement of his PEG tube.  No other complaint.  No signs of infection  5:04 PM Successful replacement of gastrostomy tube, 16 [place], with image confirmation.  Balloon inflated to 9cc.  Successful withdrawal of gastric content.     Domenic Moras, PA-C 03/14/21 1758    Hayden Rasmussen, MD 03/15/21 [phone]

## 2021-03-15 DIAGNOSIS — K9420 Gastrostomy complication, unspecified: Secondary | ICD-10-CM | POA: Diagnosis not present

## 2021-03-15 DIAGNOSIS — D649 Anemia, unspecified: Secondary | ICD-10-CM | POA: Diagnosis not present

## 2021-03-15 DIAGNOSIS — Z933 Colostomy status: Secondary | ICD-10-CM | POA: Diagnosis not present

## 2021-03-15 DIAGNOSIS — R1312 Dysphagia, oropharyngeal phase: Secondary | ICD-10-CM | POA: Diagnosis not present

## 2021-03-15 DIAGNOSIS — E875 Hyperkalemia: Secondary | ICD-10-CM | POA: Diagnosis not present

## 2021-03-15 DIAGNOSIS — T85528A Displacement of other gastrointestinal prosthetic devices, implants and grafts, initial encounter: Secondary | ICD-10-CM | POA: Diagnosis not present

## 2021-03-15 DIAGNOSIS — E876 Hypokalemia: Secondary | ICD-10-CM | POA: Diagnosis not present

## 2021-03-16 DIAGNOSIS — M549 Dorsalgia, unspecified: Secondary | ICD-10-CM | POA: Diagnosis not present

## 2021-03-16 DIAGNOSIS — G360 Neuromyelitis optica [Devic]: Secondary | ICD-10-CM | POA: Diagnosis not present

## 2021-03-16 DIAGNOSIS — L89154 Pressure ulcer of sacral region, stage 4: Secondary | ICD-10-CM | POA: Diagnosis not present

## 2021-03-16 DIAGNOSIS — L89214 Pressure ulcer of right hip, stage 4: Secondary | ICD-10-CM | POA: Diagnosis not present

## 2021-03-16 DIAGNOSIS — Z743 Need for continuous supervision: Secondary | ICD-10-CM | POA: Diagnosis not present

## 2021-03-16 DIAGNOSIS — L89623 Pressure ulcer of left heel, stage 3: Secondary | ICD-10-CM | POA: Diagnosis not present

## 2021-03-16 DIAGNOSIS — Z7401 Bed confinement status: Secondary | ICD-10-CM | POA: Diagnosis not present

## 2021-03-16 DIAGNOSIS — R5381 Other malaise: Secondary | ICD-10-CM | POA: Diagnosis not present

## 2021-03-16 DIAGNOSIS — R531 Weakness: Secondary | ICD-10-CM | POA: Diagnosis not present

## 2021-03-23 DIAGNOSIS — R1312 Dysphagia, oropharyngeal phase: Secondary | ICD-10-CM | POA: Diagnosis not present

## 2021-03-23 DIAGNOSIS — G373 Acute transverse myelitis in demyelinating disease of central nervous system: Secondary | ICD-10-CM | POA: Diagnosis not present

## 2021-03-23 DIAGNOSIS — A419 Sepsis, unspecified organism: Secondary | ICD-10-CM | POA: Diagnosis not present

## 2021-03-23 DIAGNOSIS — M6281 Muscle weakness (generalized): Secondary | ICD-10-CM | POA: Diagnosis not present

## 2021-03-23 DIAGNOSIS — M6258 Muscle wasting and atrophy, not elsewhere classified, other site: Secondary | ICD-10-CM | POA: Diagnosis not present

## 2021-03-23 DIAGNOSIS — L89154 Pressure ulcer of sacral region, stage 4: Secondary | ICD-10-CM | POA: Diagnosis not present

## 2021-03-23 DIAGNOSIS — Z7409 Other reduced mobility: Secondary | ICD-10-CM | POA: Diagnosis not present

## 2021-03-23 DIAGNOSIS — R532 Functional quadriplegia: Secondary | ICD-10-CM | POA: Diagnosis not present

## 2021-03-23 DIAGNOSIS — L89623 Pressure ulcer of left heel, stage 3: Secondary | ICD-10-CM | POA: Diagnosis not present

## 2021-03-23 DIAGNOSIS — R278 Other lack of coordination: Secondary | ICD-10-CM | POA: Diagnosis not present

## 2021-03-23 DIAGNOSIS — I10 Essential (primary) hypertension: Secondary | ICD-10-CM | POA: Diagnosis not present

## 2021-03-23 DIAGNOSIS — G8220 Paraplegia, unspecified: Secondary | ICD-10-CM | POA: Diagnosis not present

## 2021-03-23 DIAGNOSIS — G8250 Quadriplegia, unspecified: Secondary | ICD-10-CM | POA: Diagnosis not present

## 2021-03-23 DIAGNOSIS — M6259 Muscle wasting and atrophy, not elsewhere classified, multiple sites: Secondary | ICD-10-CM | POA: Diagnosis not present

## 2021-03-23 DIAGNOSIS — L89214 Pressure ulcer of right hip, stage 4: Secondary | ICD-10-CM | POA: Diagnosis not present

## 2021-03-23 DIAGNOSIS — R2681 Unsteadiness on feet: Secondary | ICD-10-CM | POA: Diagnosis not present

## 2021-03-23 DIAGNOSIS — R41841 Cognitive communication deficit: Secondary | ICD-10-CM | POA: Diagnosis not present

## 2021-03-24 ENCOUNTER — Ambulatory Visit (INDEPENDENT_AMBULATORY_CARE_PROVIDER_SITE_OTHER): Payer: Medicare Other | Admitting: Internal Medicine

## 2021-03-24 ENCOUNTER — Encounter: Payer: Self-pay | Admitting: Internal Medicine

## 2021-03-24 ENCOUNTER — Other Ambulatory Visit: Payer: Self-pay

## 2021-03-24 DIAGNOSIS — M86151 Other acute osteomyelitis, right femur: Secondary | ICD-10-CM | POA: Diagnosis not present

## 2021-03-24 DIAGNOSIS — Z7401 Bed confinement status: Secondary | ICD-10-CM | POA: Diagnosis not present

## 2021-03-24 DIAGNOSIS — Z743 Need for continuous supervision: Secondary | ICD-10-CM | POA: Diagnosis not present

## 2021-03-24 DIAGNOSIS — Z209 Contact with and (suspected) exposure to unspecified communicable disease: Secondary | ICD-10-CM | POA: Diagnosis not present

## 2021-03-24 DIAGNOSIS — R5381 Other malaise: Secondary | ICD-10-CM | POA: Diagnosis not present

## 2021-03-24 MED ORDER — AMOXICILLIN-POT CLAVULANATE 875-125 MG PO TABS: 1.0000 | ORAL_TABLET | Freq: Two times a day (BID) | ORAL | Status: AC

## 2021-03-24 MED ORDER — AMOXICILLIN-POT CLAVULANATE 875-125 MG PO TABS: 1.0000 | ORAL_TABLET | Freq: Two times a day (BID) | ORAL | Status: DC

## 2021-03-24 NOTE — Progress Notes (Signed)
[facility] for Infectious Disease  Reason for Consult: Right ischial osteomyelitis Referring Provider: Ladell Heads ADNP  Assessment: Not surprisingly, Brent [name] has acute osteomyelitis complicating his chronic right ischial pressure sore.  He has been persistently colonized with a fairly antibiotic susceptible Proteus species.  His recent cultures have grown multiple gram-negative rods.  His osteomyelitis is almost certainly a mixed aerobic and anaerobic infection.  I recommend treating him with 6 weeks of oral amoxicillin clavulanate.  I do not feel that we need IV antibiotics at this point.  I have discussed these recommendations with Brent [name] and he is in agreement.  His records here indicate that he has a penicillin allergy resulting in hives.  However, it is clearly documented that he is tolerated IV ampicillin sulbactam and oral amoxicillin clavulanate recently.  Plan: Recommend amoxicillin clavulanate 875 mg by mouth twice daily for 6 weeks He may follow-up here as needed  Patient Active Problem List   Diagnosis Date Noted   Osteomyelitis of pelvic region, acute, right ([place]) 03/24/2021    Priority: High   Right ischial pressure sore 02/10/2021    Priority: High   Sacral ulcer, with necrosis of muscle ([place]) 04/09/2020    Priority: Medium   Transverse myelitis ([place])     Priority: Medium   Paraplegia ([place])     Priority: Medium   Encounter for monitoring immunomodulating therapy 08/20/2020   High risk medication use 08/20/2020   Malnutrition of moderate degree 06/04/2020   Palliative care by specialist    Goals of care, counseling/discussion    FTT (failure to thrive) in adult 06/01/2020   Abdominal pain    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[place])    Labile blood pressure    Labile blood glucose    Neurogenic bladder    Neurogenic bowel    Acute blood loss anemia    Transaminitis    Controlled type 2 diabetes mellitus with hyperglycemia, with long-term current use of  insulin ([place])    Dyslipidemia    HCAP (healthcare-associated pneumonia)    Coronary artery disease involving native coronary artery of native heart without angina pectoris    Steroid-induced hyperglycemia    Diabetic peripheral neuropathy ([place])    Hyponatremia    AKI (acute kidney injury) ([place])    Weakness 03/09/2020   Chest pain 03/08/2020   Right leg numbness    Malignant melanoma of left side of neck ([place]) 10/25/2018   Facial neuritis 10/25/2018   Myofascial pain syndrome 10/25/2018   Occipital neuralgia of left side 10/25/2018   CAP (community acquired pneumonia) 09/08/2017   Male hypogonadism 06/20/2016   Renal lesion 06/20/2016   Insomnia 08/07/2015   Enlarged prostate with lower urinary tract symptoms (LUTS) 11/07/2014   Balanitis 07/30/2012   Bladder neck obstruction 07/30/2012   Prostate nodule 06/18/2012   Recurrent nephrolithiasis 06/18/2012   Benign neoplasm of colon 08/29/2011   DEPRESSION, SITUATIONAL, ACUTE 01/23/2010   Essential hypertension 05/30/2009   Coronary atherosclerosis 05/30/2009   Obstructive sleep apnea 11/28/2008   OBESITY 09/23/2008   ERECTILE DYSFUNCTION 11/26/2007   Well controlled type 2 diabetes mellitus with peripheral circulatory disorder ([place]) 05/09/2007   Hyperlipidemia 05/09/2007   MYOCARDIAL INFARCTION, HX OF 05/09/2007   DIVERTICULOSIS, COLON 05/09/2007    Patient's Medications  New Prescriptions   No medications on file  Previous Medications   ACETAMINOPHEN (TYLENOL) 325 MG TABLET    Take 2 tablets (650 mg total) by mouth every 6 (six) hours as needed  for headache.   ALBUTEROL (PROVENTIL) (2.5 MG/3ML) 0.083% NEBULIZER SOLUTION    Take 3 mLs (2.5 mg total) by nebulization once as needed for wheezing or shortness of breath (or anaphylaxis due to Rituxan infusion).   AMINO ACIDS-PROTEIN HYDROLYS (FEEDING SUPPLEMENT, PRO-STAT SUGAR FREE 64,) LIQD    Take 30 mLs by mouth in the morning and at bedtime.   ANTISEPTIC ORAL RINSE (BIOTENE)  LIQD    10 mLs by Mouth Rinse route in the morning, at noon, and at bedtime. Swish & Spit   ASCORBIC ACID (VITA-C PO)    Take 500 mg by mouth daily.   ASPIRIN EC 81 MG TABLET    Take 81 mg by mouth daily. Swallow whole.   ATORVASTATIN (LIPITOR) 20 MG TABLET    Take 1 tablet (20 mg total) by mouth daily.   BACLOFEN 5 MG TABS    Take 1 tablet by mouth at bedtime.   COLLAGENASE (SANTYL) OINTMENT    Apply topically daily.   DRONABINOL (MARINOL) 2.5 MG CAPSULE    Take 2.5 mg by mouth in the morning and at bedtime.   FERROUS GLUCONATE (FERGON) 324 MG TABLET    Take 324 mg by mouth 2 (two) times daily with a meal.   FLUTICASONE (FLONASE) 50 MCG/ACT NASAL SPRAY    Place 2 sprays into both nostrils 2 (two) times daily.   GABAPENTIN (NEURONTIN) 300 MG CAPSULE    Place 1 capsule (300 mg total) into feeding tube at bedtime.   ICOSAPENT ETHYL (VASCEPA) 1 G CAPS    Take 2 capsules (2 g total) by mouth 2 (two) times daily.   INSULIN ASPART (NOVOLOG) 100 UNIT/ML INJECTION    Inject 0-15 Units into the skin every 4 (four) hours. Sliding scale  CBG 70 - 120: 0 units: CBG 121 - 150: 2 units; CBG 151 - 200: 3 units; CBG 201 - 250: 5 units; CBG 251 - 300: 8 units;CBG 301 - 350: 11 units; CBG 351 - 400: 15 units; CBG > 400 : 15 units and notify MD   INSULIN GLARGINE (LANTUS) 100 UNIT/ML SOPN    Inject 15 Units into the skin at bedtime.   LIDOCAINE 4 % PTCH    Apply 1 patch topically 2 (two) times daily. Apply to right hip in the morning remove at night   LISINOPRIL (ZESTRIL) 20 MG TABLET    Take 1 tablet by mouth daily.   MAGNESIUM HYDROXIDE (MILK OF MAGNESIA) 400 MG/5ML SUSPENSION    Take 30 mLs by mouth daily as needed for mild constipation.   MENTHOL (ICY HOT ADVANCED RELIEF) 7.5 % PTCH    Apply 1 patch topically daily.   MENTHOL, TOPICAL ANALGESIC, (BIOFREEZE) 4 % GEL    Apply 1 application topically in the morning, at noon, and at bedtime.    METFORMIN (GLUCOPHAGE) 500 MG TABLET    Take 250 mg by mouth 2 (two)  times daily with a meal.   METHENAMINE (HIPREX) 1 G TABLET    Take 1 g by mouth 2 (two) times daily.   NUTRITIONAL SUPPLEMENTS (FEEDING SUPPLEMENT, GLUCERNA 1.5 CAL,) LIQD    Place 75 mLs into feeding tube continuous. 75 ml/hr   PANTOPRAZOLE (PROTONIX) 40 MG TABLET    Take 40 mg by mouth 2 (two) times daily.   POLYVINYL ALCOHOL (LIQUIFILM TEARS) 1.4 % OPHTHALMIC SOLUTION    Place 2 drops into both eyes 2 (two) times daily as needed for dry eyes.   PSYLLIUM PO  Take 0.4 g by mouth in the morning and at bedtime.   SENNA-DOCUSATE (SENOKOT-S) 8.6-50 MG TABLET    Take 2 tablets by mouth daily.   SERTRALINE (ZOLOFT) 50 MG TABLET    Take 50 mg by mouth at bedtime.   TAMSULOSIN (FLOMAX) 0.4 MG CAPS CAPSULE    Take 1 capsule (0.4 mg total) by mouth daily after supper.   TRAMADOL (ULTRAM) 50 MG TABLET    Take 50 mg by mouth 2 (two) times daily as needed for moderate pain.   TRAZODONE (DESYREL) 50 MG TABLET    Take 50 mg by mouth at bedtime.   VITAMIN B-12 (CYANOCOBALAMIN) 500 MCG TABLET    Take 500 mcg by mouth daily.   ZINC SULFATE 220 (50 ZN) MG CAPSULE    Place 1 capsule (220 mg total) into feeding tube daily.  Modified Medications   No medications on file  Discontinued Medications   No medications on file    HPI: Brent [name]. is a 75 y.o. male who developed transverse myelitis and lower extremity paraplegia in July of last year.  He had a lengthy hospital stay before being transferred to a skilled nursing facility.  He had been suffering from very dry mouth and anorexia.  He had dramatic weight loss and has developed a large stage IV sacral pressure ulcer.  He was admitted last September because of failure to thrive. MRI revealed a left gluteal abscess adjacent to his wound and L4-5 osteomyelitis with a right psoas abscess.  Admission blood cultures grew Proteus.  Wound cultures grew Proteus and Enterococcus.  A diverting colostomy was performed.  He was discharged on IV ampicillin sulbactam  and completed 6 weeks of therapy on 07/17/2020.   In late February of this year he developed hematuria and hypotension and was readmitted.  He had a Proteus urinary tract infection.  He was treated with IV antibiotics and then converted to oral amoxicillin on discharge which he took until 11/25/2020.  He was admitted again in early April with sepsis and recurrent Proteus UTI complicated by bacteremia.  He received IV ceftriaxone while hospitalized and was converted to oral amoxicillin clavulanate upon discharge and completed 14 days of total antibiotic therapy on 01/04/2021.  Notes from his skilled nursing facility indicate that there has been concern about healing of his right ischial and sacral wounds recently.  There has been some necrotic tissue noted and some exposed bone in the right ischial wound.  He had cultures obtained from the right ischial wound on 03/10/2021.  No organisms were seen on gram stain and blood cultures grew multiple gram-negative rods that was not further identified.  He underwent a bone biopsy of the right ischium on 03/16/2021 which showed acute osteomyelitis.  He does not appear that he has been on any antibiotics recently.  He says that overall he is feeling better.  He has not had any fever, chills or sweats.  Review of Systems: Review of Systems  Constitutional:  Negative for chills, diaphoresis, fever and weight loss.  Gastrointestinal:  Negative for abdominal pain, nausea and vomiting.     Past Medical History:  Diagnosis Date   Allergy    CAD (coronary artery disease)    a. angioplasty of his RCA in 1990. b. bare metal stent placed in the RCA in 2000 followed by rotational atherectomy shortly after for stent restenosis. c. last cath was in 2012 showing stable moderate diffuse CAD. (70% mid LAD, 80% diagonal, 70% Ramus, 40%  mid to distal RCA stent restenosis). d. Low risk nuc in 2015.   Diabetes mellitus    Diverticulosis    Elevated CK    Erectile dysfunction     Hemorrhoids    HTN (hypertension)    Hyperlipidemia    Hypertriglyceridemia    Malignant melanoma of left side of neck ([facility]) 10/25/2018   Myocardial infarction ([facility])    Obesity    OSA (obstructive sleep apnea)    Persistent disorder of initiating or maintaining sleep    Personal history of colonic polyps 02/05/2003    Social History   Tobacco Use   Smoking status: Former    Packs/day: 1.50    Years: 30.00    Pack years: 45.00    Types: Cigarettes    Quit date: 09/19/1980    Years since quitting: 40.5   Smokeless tobacco: Never  Vaping Use   Vaping Use: Never used  Substance Use Topics   Alcohol use: Not Currently    Alcohol/week: 7.0 - 14.0 standard drinks    Types: 7 - 14 Shots of liquor per week   Drug use: No    Family History  Problem Relation Age of Onset   Liver cancer Father    Lung cancer Father    Heart disease Father    Heart disease Sister    Lung cancer Mother    COPD Mother    Obesity Mother    Colon cancer Neg Hx    Stomach cancer Neg Hx    Pancreatic cancer Neg Hx    Allergies  Allergen Reactions   Levofloxacin Rash   Penicillins Hives and Rash    Tolerated Unasyn, cefepime and cephalexin    OBJECTIVE: Vitals:   03/24/21 1014  BP: 137/68  Pulse: 85  Temp: 98.9 F (37.2 C)  TempSrc: Oral   There is no height or weight on file to calculate BMI.   Physical Exam Constitutional:      Comments: He is very pleasant and in no distress.  He is resting quietly on the EMS stretcher.  Cardiovascular:     Rate and Rhythm: Normal rate.  Pulmonary:     Effort: Pulmonary effort is normal.  Abdominal:     Palpations: Abdomen is soft.     Tenderness: There is no abdominal tenderness.     Comments: He has a left-sided feeding tube and colostomy.  He has a Foley catheter.  Skin:    Comments: His right ischial wound is large and fairly superficial.  His lower sacral wound is smaller but deeper with some undermining around the edges.  Both wounds have  some yellow-green drainage.  There is no fluctuance or malodor.  Psychiatric:        Mood and Affect: Mood normal.       Microbiology: No results found for this or any previous visit (from the past 240 hour(s)).  Michel Bickers, MD [facility] [phone] pager   [phone] cell 03/24/2021, 3:37 PM

## 2021-03-25 DIAGNOSIS — R532 Functional quadriplegia: Secondary | ICD-10-CM | POA: Diagnosis not present

## 2021-03-25 DIAGNOSIS — R2681 Unsteadiness on feet: Secondary | ICD-10-CM | POA: Diagnosis not present

## 2021-03-25 DIAGNOSIS — A419 Sepsis, unspecified organism: Secondary | ICD-10-CM | POA: Diagnosis not present

## 2021-03-25 DIAGNOSIS — M6258 Muscle wasting and atrophy, not elsewhere classified, other site: Secondary | ICD-10-CM | POA: Diagnosis not present

## 2021-03-25 DIAGNOSIS — M6259 Muscle wasting and atrophy, not elsewhere classified, multiple sites: Secondary | ICD-10-CM | POA: Diagnosis not present

## 2021-03-25 DIAGNOSIS — M6281 Muscle weakness (generalized): Secondary | ICD-10-CM | POA: Diagnosis not present

## 2021-03-26 DIAGNOSIS — A419 Sepsis, unspecified organism: Secondary | ICD-10-CM | POA: Diagnosis not present

## 2021-03-26 DIAGNOSIS — R532 Functional quadriplegia: Secondary | ICD-10-CM | POA: Diagnosis not present

## 2021-03-26 DIAGNOSIS — R2681 Unsteadiness on feet: Secondary | ICD-10-CM | POA: Diagnosis not present

## 2021-03-26 DIAGNOSIS — M6281 Muscle weakness (generalized): Secondary | ICD-10-CM | POA: Diagnosis not present

## 2021-03-26 DIAGNOSIS — M6258 Muscle wasting and atrophy, not elsewhere classified, other site: Secondary | ICD-10-CM | POA: Diagnosis not present

## 2021-03-26 DIAGNOSIS — M6259 Muscle wasting and atrophy, not elsewhere classified, multiple sites: Secondary | ICD-10-CM | POA: Diagnosis not present

## 2021-03-30 DIAGNOSIS — A419 Sepsis, unspecified organism: Secondary | ICD-10-CM | POA: Diagnosis not present

## 2021-03-30 DIAGNOSIS — L89154 Pressure ulcer of sacral region, stage 4: Secondary | ICD-10-CM | POA: Diagnosis not present

## 2021-03-30 DIAGNOSIS — M6258 Muscle wasting and atrophy, not elsewhere classified, other site: Secondary | ICD-10-CM | POA: Diagnosis not present

## 2021-03-30 DIAGNOSIS — M6281 Muscle weakness (generalized): Secondary | ICD-10-CM | POA: Diagnosis not present

## 2021-03-30 DIAGNOSIS — R532 Functional quadriplegia: Secondary | ICD-10-CM | POA: Diagnosis not present

## 2021-03-30 DIAGNOSIS — L89623 Pressure ulcer of left heel, stage 3: Secondary | ICD-10-CM | POA: Diagnosis not present

## 2021-03-30 DIAGNOSIS — L89214 Pressure ulcer of right hip, stage 4: Secondary | ICD-10-CM | POA: Diagnosis not present

## 2021-03-30 DIAGNOSIS — R2681 Unsteadiness on feet: Secondary | ICD-10-CM | POA: Diagnosis not present

## 2021-03-30 DIAGNOSIS — M6259 Muscle wasting and atrophy, not elsewhere classified, multiple sites: Secondary | ICD-10-CM | POA: Diagnosis not present

## 2021-04-01 DIAGNOSIS — H938X9 Other specified disorders of ear, unspecified ear: Secondary | ICD-10-CM | POA: Diagnosis not present

## 2021-04-01 DIAGNOSIS — E1165 Type 2 diabetes mellitus with hyperglycemia: Secondary | ICD-10-CM | POA: Diagnosis not present

## 2021-04-01 DIAGNOSIS — H04129 Dry eye syndrome of unspecified lacrimal gland: Secondary | ICD-10-CM | POA: Diagnosis not present

## 2021-04-01 DIAGNOSIS — Z7409 Other reduced mobility: Secondary | ICD-10-CM | POA: Diagnosis not present

## 2021-04-01 DIAGNOSIS — E875 Hyperkalemia: Secondary | ICD-10-CM | POA: Diagnosis not present

## 2021-04-01 DIAGNOSIS — D509 Iron deficiency anemia, unspecified: Secondary | ICD-10-CM | POA: Diagnosis not present

## 2021-04-01 DIAGNOSIS — G8250 Quadriplegia, unspecified: Secondary | ICD-10-CM | POA: Diagnosis not present

## 2021-04-01 DIAGNOSIS — R0989 Other specified symptoms and signs involving the circulatory and respiratory systems: Secondary | ICD-10-CM | POA: Diagnosis not present

## 2021-04-04 DIAGNOSIS — M6258 Muscle wasting and atrophy, not elsewhere classified, other site: Secondary | ICD-10-CM | POA: Diagnosis not present

## 2021-04-04 DIAGNOSIS — R532 Functional quadriplegia: Secondary | ICD-10-CM | POA: Diagnosis not present

## 2021-04-04 DIAGNOSIS — A419 Sepsis, unspecified organism: Secondary | ICD-10-CM | POA: Diagnosis not present

## 2021-04-04 DIAGNOSIS — M6259 Muscle wasting and atrophy, not elsewhere classified, multiple sites: Secondary | ICD-10-CM | POA: Diagnosis not present

## 2021-04-04 DIAGNOSIS — M6281 Muscle weakness (generalized): Secondary | ICD-10-CM | POA: Diagnosis not present

## 2021-04-04 DIAGNOSIS — R2681 Unsteadiness on feet: Secondary | ICD-10-CM | POA: Diagnosis not present

## 2021-04-05 DIAGNOSIS — E118 Type 2 diabetes mellitus with unspecified complications: Secondary | ICD-10-CM | POA: Diagnosis not present

## 2021-04-05 DIAGNOSIS — M6259 Muscle wasting and atrophy, not elsewhere classified, multiple sites: Secondary | ICD-10-CM | POA: Diagnosis not present

## 2021-04-05 DIAGNOSIS — M6258 Muscle wasting and atrophy, not elsewhere classified, other site: Secondary | ICD-10-CM | POA: Diagnosis not present

## 2021-04-05 DIAGNOSIS — M6281 Muscle weakness (generalized): Secondary | ICD-10-CM | POA: Diagnosis not present

## 2021-04-05 DIAGNOSIS — A419 Sepsis, unspecified organism: Secondary | ICD-10-CM | POA: Diagnosis not present

## 2021-04-05 DIAGNOSIS — M8668 Other chronic osteomyelitis, other site: Secondary | ICD-10-CM | POA: Diagnosis not present

## 2021-04-05 DIAGNOSIS — E1165 Type 2 diabetes mellitus with hyperglycemia: Secondary | ICD-10-CM | POA: Diagnosis not present

## 2021-04-05 DIAGNOSIS — R532 Functional quadriplegia: Secondary | ICD-10-CM | POA: Diagnosis not present

## 2021-04-05 DIAGNOSIS — L89154 Pressure ulcer of sacral region, stage 4: Secondary | ICD-10-CM | POA: Diagnosis not present

## 2021-04-05 DIAGNOSIS — G8250 Quadriplegia, unspecified: Secondary | ICD-10-CM | POA: Diagnosis not present

## 2021-04-05 DIAGNOSIS — N318 Other neuromuscular dysfunction of bladder: Secondary | ICD-10-CM | POA: Diagnosis not present

## 2021-04-05 DIAGNOSIS — H1013 Acute atopic conjunctivitis, bilateral: Secondary | ICD-10-CM | POA: Diagnosis not present

## 2021-04-05 DIAGNOSIS — R2681 Unsteadiness on feet: Secondary | ICD-10-CM | POA: Diagnosis not present

## 2021-04-06 DIAGNOSIS — L89214 Pressure ulcer of right hip, stage 4: Secondary | ICD-10-CM | POA: Diagnosis not present

## 2021-04-06 DIAGNOSIS — L89623 Pressure ulcer of left heel, stage 3: Secondary | ICD-10-CM | POA: Diagnosis not present

## 2021-04-06 DIAGNOSIS — M6281 Muscle weakness (generalized): Secondary | ICD-10-CM | POA: Diagnosis not present

## 2021-04-06 DIAGNOSIS — R2681 Unsteadiness on feet: Secondary | ICD-10-CM | POA: Diagnosis not present

## 2021-04-06 DIAGNOSIS — M6258 Muscle wasting and atrophy, not elsewhere classified, other site: Secondary | ICD-10-CM | POA: Diagnosis not present

## 2021-04-06 DIAGNOSIS — L89154 Pressure ulcer of sacral region, stage 4: Secondary | ICD-10-CM | POA: Diagnosis not present

## 2021-04-06 DIAGNOSIS — A419 Sepsis, unspecified organism: Secondary | ICD-10-CM | POA: Diagnosis not present

## 2021-04-06 DIAGNOSIS — R532 Functional quadriplegia: Secondary | ICD-10-CM | POA: Diagnosis not present

## 2021-04-06 DIAGNOSIS — M6259 Muscle wasting and atrophy, not elsewhere classified, multiple sites: Secondary | ICD-10-CM | POA: Diagnosis not present

## 2021-04-07 DIAGNOSIS — R2681 Unsteadiness on feet: Secondary | ICD-10-CM | POA: Diagnosis not present

## 2021-04-07 DIAGNOSIS — M6281 Muscle weakness (generalized): Secondary | ICD-10-CM | POA: Diagnosis not present

## 2021-04-07 DIAGNOSIS — M6258 Muscle wasting and atrophy, not elsewhere classified, other site: Secondary | ICD-10-CM | POA: Diagnosis not present

## 2021-04-07 DIAGNOSIS — M6259 Muscle wasting and atrophy, not elsewhere classified, multiple sites: Secondary | ICD-10-CM | POA: Diagnosis not present

## 2021-04-07 DIAGNOSIS — A419 Sepsis, unspecified organism: Secondary | ICD-10-CM | POA: Diagnosis not present

## 2021-04-07 DIAGNOSIS — R532 Functional quadriplegia: Secondary | ICD-10-CM | POA: Diagnosis not present

## 2021-04-08 DIAGNOSIS — M6259 Muscle wasting and atrophy, not elsewhere classified, multiple sites: Secondary | ICD-10-CM | POA: Diagnosis not present

## 2021-04-08 DIAGNOSIS — M6281 Muscle weakness (generalized): Secondary | ICD-10-CM | POA: Diagnosis not present

## 2021-04-08 DIAGNOSIS — R2681 Unsteadiness on feet: Secondary | ICD-10-CM | POA: Diagnosis not present

## 2021-04-08 DIAGNOSIS — R532 Functional quadriplegia: Secondary | ICD-10-CM | POA: Diagnosis not present

## 2021-04-08 DIAGNOSIS — M6258 Muscle wasting and atrophy, not elsewhere classified, other site: Secondary | ICD-10-CM | POA: Diagnosis not present

## 2021-04-08 DIAGNOSIS — A419 Sepsis, unspecified organism: Secondary | ICD-10-CM | POA: Diagnosis not present

## 2021-04-09 DIAGNOSIS — M6259 Muscle wasting and atrophy, not elsewhere classified, multiple sites: Secondary | ICD-10-CM | POA: Diagnosis not present

## 2021-04-09 DIAGNOSIS — M6258 Muscle wasting and atrophy, not elsewhere classified, other site: Secondary | ICD-10-CM | POA: Diagnosis not present

## 2021-04-09 DIAGNOSIS — R2681 Unsteadiness on feet: Secondary | ICD-10-CM | POA: Diagnosis not present

## 2021-04-09 DIAGNOSIS — M6281 Muscle weakness (generalized): Secondary | ICD-10-CM | POA: Diagnosis not present

## 2021-04-09 DIAGNOSIS — R532 Functional quadriplegia: Secondary | ICD-10-CM | POA: Diagnosis not present

## 2021-04-09 DIAGNOSIS — A419 Sepsis, unspecified organism: Secondary | ICD-10-CM | POA: Diagnosis not present

## 2021-04-11 DIAGNOSIS — M6259 Muscle wasting and atrophy, not elsewhere classified, multiple sites: Secondary | ICD-10-CM | POA: Diagnosis not present

## 2021-04-11 DIAGNOSIS — M6281 Muscle weakness (generalized): Secondary | ICD-10-CM | POA: Diagnosis not present

## 2021-04-11 DIAGNOSIS — A419 Sepsis, unspecified organism: Secondary | ICD-10-CM | POA: Diagnosis not present

## 2021-04-11 DIAGNOSIS — R532 Functional quadriplegia: Secondary | ICD-10-CM | POA: Diagnosis not present

## 2021-04-11 DIAGNOSIS — M6258 Muscle wasting and atrophy, not elsewhere classified, other site: Secondary | ICD-10-CM | POA: Diagnosis not present

## 2021-04-11 DIAGNOSIS — R2681 Unsteadiness on feet: Secondary | ICD-10-CM | POA: Diagnosis not present

## 2021-04-12 DIAGNOSIS — M6259 Muscle wasting and atrophy, not elsewhere classified, multiple sites: Secondary | ICD-10-CM | POA: Diagnosis not present

## 2021-04-12 DIAGNOSIS — A419 Sepsis, unspecified organism: Secondary | ICD-10-CM | POA: Diagnosis not present

## 2021-04-12 DIAGNOSIS — R2681 Unsteadiness on feet: Secondary | ICD-10-CM | POA: Diagnosis not present

## 2021-04-12 DIAGNOSIS — M6258 Muscle wasting and atrophy, not elsewhere classified, other site: Secondary | ICD-10-CM | POA: Diagnosis not present

## 2021-04-12 DIAGNOSIS — M6281 Muscle weakness (generalized): Secondary | ICD-10-CM | POA: Diagnosis not present

## 2021-04-12 DIAGNOSIS — R532 Functional quadriplegia: Secondary | ICD-10-CM | POA: Diagnosis not present

## 2021-04-13 DIAGNOSIS — M6281 Muscle weakness (generalized): Secondary | ICD-10-CM | POA: Diagnosis not present

## 2021-04-13 DIAGNOSIS — R532 Functional quadriplegia: Secondary | ICD-10-CM | POA: Diagnosis not present

## 2021-04-13 DIAGNOSIS — A419 Sepsis, unspecified organism: Secondary | ICD-10-CM | POA: Diagnosis not present

## 2021-04-13 DIAGNOSIS — M6258 Muscle wasting and atrophy, not elsewhere classified, other site: Secondary | ICD-10-CM | POA: Diagnosis not present

## 2021-04-13 DIAGNOSIS — R2681 Unsteadiness on feet: Secondary | ICD-10-CM | POA: Diagnosis not present

## 2021-04-13 DIAGNOSIS — M6259 Muscle wasting and atrophy, not elsewhere classified, multiple sites: Secondary | ICD-10-CM | POA: Diagnosis not present

## 2021-04-14 DIAGNOSIS — M6281 Muscle weakness (generalized): Secondary | ICD-10-CM | POA: Diagnosis not present

## 2021-04-14 DIAGNOSIS — M6259 Muscle wasting and atrophy, not elsewhere classified, multiple sites: Secondary | ICD-10-CM | POA: Diagnosis not present

## 2021-04-14 DIAGNOSIS — M6258 Muscle wasting and atrophy, not elsewhere classified, other site: Secondary | ICD-10-CM | POA: Diagnosis not present

## 2021-04-14 DIAGNOSIS — R2681 Unsteadiness on feet: Secondary | ICD-10-CM | POA: Diagnosis not present

## 2021-04-14 DIAGNOSIS — R532 Functional quadriplegia: Secondary | ICD-10-CM | POA: Diagnosis not present

## 2021-04-14 DIAGNOSIS — A419 Sepsis, unspecified organism: Secondary | ICD-10-CM | POA: Diagnosis not present

## 2021-04-15 DIAGNOSIS — R2681 Unsteadiness on feet: Secondary | ICD-10-CM | POA: Diagnosis not present

## 2021-04-15 DIAGNOSIS — M6258 Muscle wasting and atrophy, not elsewhere classified, other site: Secondary | ICD-10-CM | POA: Diagnosis not present

## 2021-04-15 DIAGNOSIS — R532 Functional quadriplegia: Secondary | ICD-10-CM | POA: Diagnosis not present

## 2021-04-15 DIAGNOSIS — M6259 Muscle wasting and atrophy, not elsewhere classified, multiple sites: Secondary | ICD-10-CM | POA: Diagnosis not present

## 2021-04-15 DIAGNOSIS — A419 Sepsis, unspecified organism: Secondary | ICD-10-CM | POA: Diagnosis not present

## 2021-04-15 DIAGNOSIS — M6281 Muscle weakness (generalized): Secondary | ICD-10-CM | POA: Diagnosis not present

## 2021-04-16 DIAGNOSIS — M6258 Muscle wasting and atrophy, not elsewhere classified, other site: Secondary | ICD-10-CM | POA: Diagnosis not present

## 2021-04-16 DIAGNOSIS — R532 Functional quadriplegia: Secondary | ICD-10-CM | POA: Diagnosis not present

## 2021-04-16 DIAGNOSIS — M6281 Muscle weakness (generalized): Secondary | ICD-10-CM | POA: Diagnosis not present

## 2021-04-16 DIAGNOSIS — M6259 Muscle wasting and atrophy, not elsewhere classified, multiple sites: Secondary | ICD-10-CM | POA: Diagnosis not present

## 2021-04-16 DIAGNOSIS — A419 Sepsis, unspecified organism: Secondary | ICD-10-CM | POA: Diagnosis not present

## 2021-04-16 DIAGNOSIS — R2681 Unsteadiness on feet: Secondary | ICD-10-CM | POA: Diagnosis not present

## 2021-04-17 DIAGNOSIS — M6258 Muscle wasting and atrophy, not elsewhere classified, other site: Secondary | ICD-10-CM | POA: Diagnosis not present

## 2021-04-17 DIAGNOSIS — M6281 Muscle weakness (generalized): Secondary | ICD-10-CM | POA: Diagnosis not present

## 2021-04-17 DIAGNOSIS — R2681 Unsteadiness on feet: Secondary | ICD-10-CM | POA: Diagnosis not present

## 2021-04-17 DIAGNOSIS — R532 Functional quadriplegia: Secondary | ICD-10-CM | POA: Diagnosis not present

## 2021-04-17 DIAGNOSIS — M6259 Muscle wasting and atrophy, not elsewhere classified, multiple sites: Secondary | ICD-10-CM | POA: Diagnosis not present

## 2021-04-17 DIAGNOSIS — A419 Sepsis, unspecified organism: Secondary | ICD-10-CM | POA: Diagnosis not present

## 2021-04-19 DIAGNOSIS — A419 Sepsis, unspecified organism: Secondary | ICD-10-CM | POA: Diagnosis not present

## 2021-04-19 DIAGNOSIS — M6281 Muscle weakness (generalized): Secondary | ICD-10-CM | POA: Diagnosis not present

## 2021-04-19 DIAGNOSIS — M6258 Muscle wasting and atrophy, not elsewhere classified, other site: Secondary | ICD-10-CM | POA: Diagnosis not present

## 2021-04-19 DIAGNOSIS — R278 Other lack of coordination: Secondary | ICD-10-CM | POA: Diagnosis not present

## 2021-04-19 DIAGNOSIS — R532 Functional quadriplegia: Secondary | ICD-10-CM | POA: Diagnosis not present

## 2021-04-19 DIAGNOSIS — R2681 Unsteadiness on feet: Secondary | ICD-10-CM | POA: Diagnosis not present

## 2021-04-19 DIAGNOSIS — Z7409 Other reduced mobility: Secondary | ICD-10-CM | POA: Diagnosis not present

## 2021-04-19 DIAGNOSIS — R1312 Dysphagia, oropharyngeal phase: Secondary | ICD-10-CM | POA: Diagnosis not present

## 2021-04-19 DIAGNOSIS — G8250 Quadriplegia, unspecified: Secondary | ICD-10-CM | POA: Diagnosis not present

## 2021-04-19 DIAGNOSIS — G373 Acute transverse myelitis in demyelinating disease of central nervous system: Secondary | ICD-10-CM | POA: Diagnosis not present

## 2021-04-19 DIAGNOSIS — M6259 Muscle wasting and atrophy, not elsewhere classified, multiple sites: Secondary | ICD-10-CM | POA: Diagnosis not present

## 2021-04-19 DIAGNOSIS — R41841 Cognitive communication deficit: Secondary | ICD-10-CM | POA: Diagnosis not present

## 2021-04-20 DIAGNOSIS — M6258 Muscle wasting and atrophy, not elsewhere classified, other site: Secondary | ICD-10-CM | POA: Diagnosis not present

## 2021-04-20 DIAGNOSIS — M6259 Muscle wasting and atrophy, not elsewhere classified, multiple sites: Secondary | ICD-10-CM | POA: Diagnosis not present

## 2021-04-20 DIAGNOSIS — M6281 Muscle weakness (generalized): Secondary | ICD-10-CM | POA: Diagnosis not present

## 2021-04-20 DIAGNOSIS — R532 Functional quadriplegia: Secondary | ICD-10-CM | POA: Diagnosis not present

## 2021-04-20 DIAGNOSIS — L89154 Pressure ulcer of sacral region, stage 4: Secondary | ICD-10-CM | POA: Diagnosis not present

## 2021-04-20 DIAGNOSIS — R2681 Unsteadiness on feet: Secondary | ICD-10-CM | POA: Diagnosis not present

## 2021-04-20 DIAGNOSIS — L89214 Pressure ulcer of right hip, stage 4: Secondary | ICD-10-CM | POA: Diagnosis not present

## 2021-04-20 DIAGNOSIS — A419 Sepsis, unspecified organism: Secondary | ICD-10-CM | POA: Diagnosis not present

## 2021-04-21 DIAGNOSIS — M6258 Muscle wasting and atrophy, not elsewhere classified, other site: Secondary | ICD-10-CM | POA: Diagnosis not present

## 2021-04-21 DIAGNOSIS — R532 Functional quadriplegia: Secondary | ICD-10-CM | POA: Diagnosis not present

## 2021-04-21 DIAGNOSIS — M6281 Muscle weakness (generalized): Secondary | ICD-10-CM | POA: Diagnosis not present

## 2021-04-21 DIAGNOSIS — R2681 Unsteadiness on feet: Secondary | ICD-10-CM | POA: Diagnosis not present

## 2021-04-21 DIAGNOSIS — M6259 Muscle wasting and atrophy, not elsewhere classified, multiple sites: Secondary | ICD-10-CM | POA: Diagnosis not present

## 2021-04-21 DIAGNOSIS — A419 Sepsis, unspecified organism: Secondary | ICD-10-CM | POA: Diagnosis not present

## 2021-04-22 DIAGNOSIS — M6258 Muscle wasting and atrophy, not elsewhere classified, other site: Secondary | ICD-10-CM | POA: Diagnosis not present

## 2021-04-22 DIAGNOSIS — M6259 Muscle wasting and atrophy, not elsewhere classified, multiple sites: Secondary | ICD-10-CM | POA: Diagnosis not present

## 2021-04-22 DIAGNOSIS — A419 Sepsis, unspecified organism: Secondary | ICD-10-CM | POA: Diagnosis not present

## 2021-04-22 DIAGNOSIS — M6281 Muscle weakness (generalized): Secondary | ICD-10-CM | POA: Diagnosis not present

## 2021-04-22 DIAGNOSIS — R2681 Unsteadiness on feet: Secondary | ICD-10-CM | POA: Diagnosis not present

## 2021-04-22 DIAGNOSIS — R532 Functional quadriplegia: Secondary | ICD-10-CM | POA: Diagnosis not present

## 2021-04-23 DIAGNOSIS — R532 Functional quadriplegia: Secondary | ICD-10-CM | POA: Diagnosis not present

## 2021-04-23 DIAGNOSIS — M6258 Muscle wasting and atrophy, not elsewhere classified, other site: Secondary | ICD-10-CM | POA: Diagnosis not present

## 2021-04-23 DIAGNOSIS — A419 Sepsis, unspecified organism: Secondary | ICD-10-CM | POA: Diagnosis not present

## 2021-04-23 DIAGNOSIS — M6281 Muscle weakness (generalized): Secondary | ICD-10-CM | POA: Diagnosis not present

## 2021-04-23 DIAGNOSIS — M6259 Muscle wasting and atrophy, not elsewhere classified, multiple sites: Secondary | ICD-10-CM | POA: Diagnosis not present

## 2021-04-23 DIAGNOSIS — R2681 Unsteadiness on feet: Secondary | ICD-10-CM | POA: Diagnosis not present

## 2021-04-25 DIAGNOSIS — M6281 Muscle weakness (generalized): Secondary | ICD-10-CM | POA: Diagnosis not present

## 2021-04-25 DIAGNOSIS — A419 Sepsis, unspecified organism: Secondary | ICD-10-CM | POA: Diagnosis not present

## 2021-04-25 DIAGNOSIS — M6259 Muscle wasting and atrophy, not elsewhere classified, multiple sites: Secondary | ICD-10-CM | POA: Diagnosis not present

## 2021-04-25 DIAGNOSIS — M6258 Muscle wasting and atrophy, not elsewhere classified, other site: Secondary | ICD-10-CM | POA: Diagnosis not present

## 2021-04-25 DIAGNOSIS — R532 Functional quadriplegia: Secondary | ICD-10-CM | POA: Diagnosis not present

## 2021-04-25 DIAGNOSIS — R2681 Unsteadiness on feet: Secondary | ICD-10-CM | POA: Diagnosis not present

## 2021-04-26 DIAGNOSIS — M6258 Muscle wasting and atrophy, not elsewhere classified, other site: Secondary | ICD-10-CM | POA: Diagnosis not present

## 2021-04-26 DIAGNOSIS — M6259 Muscle wasting and atrophy, not elsewhere classified, multiple sites: Secondary | ICD-10-CM | POA: Diagnosis not present

## 2021-04-26 DIAGNOSIS — R2681 Unsteadiness on feet: Secondary | ICD-10-CM | POA: Diagnosis not present

## 2021-04-26 DIAGNOSIS — A419 Sepsis, unspecified organism: Secondary | ICD-10-CM | POA: Diagnosis not present

## 2021-04-26 DIAGNOSIS — M6281 Muscle weakness (generalized): Secondary | ICD-10-CM | POA: Diagnosis not present

## 2021-04-26 DIAGNOSIS — R532 Functional quadriplegia: Secondary | ICD-10-CM | POA: Diagnosis not present

## 2021-04-27 DIAGNOSIS — R532 Functional quadriplegia: Secondary | ICD-10-CM | POA: Diagnosis not present

## 2021-04-27 DIAGNOSIS — L89214 Pressure ulcer of right hip, stage 4: Secondary | ICD-10-CM | POA: Diagnosis not present

## 2021-04-27 DIAGNOSIS — M6258 Muscle wasting and atrophy, not elsewhere classified, other site: Secondary | ICD-10-CM | POA: Diagnosis not present

## 2021-04-27 DIAGNOSIS — M6259 Muscle wasting and atrophy, not elsewhere classified, multiple sites: Secondary | ICD-10-CM | POA: Diagnosis not present

## 2021-04-27 DIAGNOSIS — M6281 Muscle weakness (generalized): Secondary | ICD-10-CM | POA: Diagnosis not present

## 2021-04-27 DIAGNOSIS — L89154 Pressure ulcer of sacral region, stage 4: Secondary | ICD-10-CM | POA: Diagnosis not present

## 2021-04-27 DIAGNOSIS — A419 Sepsis, unspecified organism: Secondary | ICD-10-CM | POA: Diagnosis not present

## 2021-04-27 DIAGNOSIS — R2681 Unsteadiness on feet: Secondary | ICD-10-CM | POA: Diagnosis not present

## 2021-04-28 DIAGNOSIS — M6281 Muscle weakness (generalized): Secondary | ICD-10-CM | POA: Diagnosis not present

## 2021-04-28 DIAGNOSIS — A419 Sepsis, unspecified organism: Secondary | ICD-10-CM | POA: Diagnosis not present

## 2021-04-28 DIAGNOSIS — R532 Functional quadriplegia: Secondary | ICD-10-CM | POA: Diagnosis not present

## 2021-04-28 DIAGNOSIS — M6259 Muscle wasting and atrophy, not elsewhere classified, multiple sites: Secondary | ICD-10-CM | POA: Diagnosis not present

## 2021-04-28 DIAGNOSIS — R2681 Unsteadiness on feet: Secondary | ICD-10-CM | POA: Diagnosis not present

## 2021-04-28 DIAGNOSIS — M6258 Muscle wasting and atrophy, not elsewhere classified, other site: Secondary | ICD-10-CM | POA: Diagnosis not present

## 2021-04-29 DIAGNOSIS — A419 Sepsis, unspecified organism: Secondary | ICD-10-CM | POA: Diagnosis not present

## 2021-04-29 DIAGNOSIS — R532 Functional quadriplegia: Secondary | ICD-10-CM | POA: Diagnosis not present

## 2021-04-29 DIAGNOSIS — M6259 Muscle wasting and atrophy, not elsewhere classified, multiple sites: Secondary | ICD-10-CM | POA: Diagnosis not present

## 2021-04-29 DIAGNOSIS — M6281 Muscle weakness (generalized): Secondary | ICD-10-CM | POA: Diagnosis not present

## 2021-04-29 DIAGNOSIS — R2681 Unsteadiness on feet: Secondary | ICD-10-CM | POA: Diagnosis not present

## 2021-04-29 DIAGNOSIS — M6258 Muscle wasting and atrophy, not elsewhere classified, other site: Secondary | ICD-10-CM | POA: Diagnosis not present

## 2021-04-30 DIAGNOSIS — R532 Functional quadriplegia: Secondary | ICD-10-CM | POA: Diagnosis not present

## 2021-04-30 DIAGNOSIS — M6258 Muscle wasting and atrophy, not elsewhere classified, other site: Secondary | ICD-10-CM | POA: Diagnosis not present

## 2021-04-30 DIAGNOSIS — M6259 Muscle wasting and atrophy, not elsewhere classified, multiple sites: Secondary | ICD-10-CM | POA: Diagnosis not present

## 2021-04-30 DIAGNOSIS — R2681 Unsteadiness on feet: Secondary | ICD-10-CM | POA: Diagnosis not present

## 2021-04-30 DIAGNOSIS — M6281 Muscle weakness (generalized): Secondary | ICD-10-CM | POA: Diagnosis not present

## 2021-04-30 DIAGNOSIS — A419 Sepsis, unspecified organism: Secondary | ICD-10-CM | POA: Diagnosis not present

## 2021-05-02 DIAGNOSIS — M6259 Muscle wasting and atrophy, not elsewhere classified, multiple sites: Secondary | ICD-10-CM | POA: Diagnosis not present

## 2021-05-02 DIAGNOSIS — R532 Functional quadriplegia: Secondary | ICD-10-CM | POA: Diagnosis not present

## 2021-05-02 DIAGNOSIS — M6258 Muscle wasting and atrophy, not elsewhere classified, other site: Secondary | ICD-10-CM | POA: Diagnosis not present

## 2021-05-02 DIAGNOSIS — M6281 Muscle weakness (generalized): Secondary | ICD-10-CM | POA: Diagnosis not present

## 2021-05-02 DIAGNOSIS — R2681 Unsteadiness on feet: Secondary | ICD-10-CM | POA: Diagnosis not present

## 2021-05-02 DIAGNOSIS — A419 Sepsis, unspecified organism: Secondary | ICD-10-CM | POA: Diagnosis not present

## 2021-05-03 DIAGNOSIS — A419 Sepsis, unspecified organism: Secondary | ICD-10-CM | POA: Diagnosis not present

## 2021-05-03 DIAGNOSIS — M6258 Muscle wasting and atrophy, not elsewhere classified, other site: Secondary | ICD-10-CM | POA: Diagnosis not present

## 2021-05-03 DIAGNOSIS — R532 Functional quadriplegia: Secondary | ICD-10-CM | POA: Diagnosis not present

## 2021-05-03 DIAGNOSIS — M6281 Muscle weakness (generalized): Secondary | ICD-10-CM | POA: Diagnosis not present

## 2021-05-03 DIAGNOSIS — M6259 Muscle wasting and atrophy, not elsewhere classified, multiple sites: Secondary | ICD-10-CM | POA: Diagnosis not present

## 2021-05-03 DIAGNOSIS — R2681 Unsteadiness on feet: Secondary | ICD-10-CM | POA: Diagnosis not present

## 2021-05-04 DIAGNOSIS — M6258 Muscle wasting and atrophy, not elsewhere classified, other site: Secondary | ICD-10-CM | POA: Diagnosis not present

## 2021-05-04 DIAGNOSIS — A419 Sepsis, unspecified organism: Secondary | ICD-10-CM | POA: Diagnosis not present

## 2021-05-04 DIAGNOSIS — R2681 Unsteadiness on feet: Secondary | ICD-10-CM | POA: Diagnosis not present

## 2021-05-04 DIAGNOSIS — Z794 Long term (current) use of insulin: Secondary | ICD-10-CM | POA: Diagnosis not present

## 2021-05-04 DIAGNOSIS — E118 Type 2 diabetes mellitus with unspecified complications: Secondary | ICD-10-CM | POA: Diagnosis not present

## 2021-05-04 DIAGNOSIS — M6259 Muscle wasting and atrophy, not elsewhere classified, multiple sites: Secondary | ICD-10-CM | POA: Diagnosis not present

## 2021-05-04 DIAGNOSIS — L89214 Pressure ulcer of right hip, stage 4: Secondary | ICD-10-CM | POA: Diagnosis not present

## 2021-05-04 DIAGNOSIS — L89154 Pressure ulcer of sacral region, stage 4: Secondary | ICD-10-CM | POA: Diagnosis not present

## 2021-05-04 DIAGNOSIS — R739 Hyperglycemia, unspecified: Secondary | ICD-10-CM | POA: Diagnosis not present

## 2021-05-04 DIAGNOSIS — M6281 Muscle weakness (generalized): Secondary | ICD-10-CM | POA: Diagnosis not present

## 2021-05-04 DIAGNOSIS — R532 Functional quadriplegia: Secondary | ICD-10-CM | POA: Diagnosis not present

## 2021-05-05 DIAGNOSIS — M6259 Muscle wasting and atrophy, not elsewhere classified, multiple sites: Secondary | ICD-10-CM | POA: Diagnosis not present

## 2021-05-05 DIAGNOSIS — A419 Sepsis, unspecified organism: Secondary | ICD-10-CM | POA: Diagnosis not present

## 2021-05-05 DIAGNOSIS — R532 Functional quadriplegia: Secondary | ICD-10-CM | POA: Diagnosis not present

## 2021-05-05 DIAGNOSIS — R2681 Unsteadiness on feet: Secondary | ICD-10-CM | POA: Diagnosis not present

## 2021-05-05 DIAGNOSIS — M6281 Muscle weakness (generalized): Secondary | ICD-10-CM | POA: Diagnosis not present

## 2021-05-05 DIAGNOSIS — M6258 Muscle wasting and atrophy, not elsewhere classified, other site: Secondary | ICD-10-CM | POA: Diagnosis not present

## 2021-05-06 DIAGNOSIS — M6259 Muscle wasting and atrophy, not elsewhere classified, multiple sites: Secondary | ICD-10-CM | POA: Diagnosis not present

## 2021-05-06 DIAGNOSIS — R2681 Unsteadiness on feet: Secondary | ICD-10-CM | POA: Diagnosis not present

## 2021-05-06 DIAGNOSIS — M6281 Muscle weakness (generalized): Secondary | ICD-10-CM | POA: Diagnosis not present

## 2021-05-06 DIAGNOSIS — A419 Sepsis, unspecified organism: Secondary | ICD-10-CM | POA: Diagnosis not present

## 2021-05-06 DIAGNOSIS — M6258 Muscle wasting and atrophy, not elsewhere classified, other site: Secondary | ICD-10-CM | POA: Diagnosis not present

## 2021-05-06 DIAGNOSIS — R532 Functional quadriplegia: Secondary | ICD-10-CM | POA: Diagnosis not present

## 2021-05-07 DIAGNOSIS — M6281 Muscle weakness (generalized): Secondary | ICD-10-CM | POA: Diagnosis not present

## 2021-05-07 DIAGNOSIS — M6258 Muscle wasting and atrophy, not elsewhere classified, other site: Secondary | ICD-10-CM | POA: Diagnosis not present

## 2021-05-07 DIAGNOSIS — M6259 Muscle wasting and atrophy, not elsewhere classified, multiple sites: Secondary | ICD-10-CM | POA: Diagnosis not present

## 2021-05-07 DIAGNOSIS — R2681 Unsteadiness on feet: Secondary | ICD-10-CM | POA: Diagnosis not present

## 2021-05-07 DIAGNOSIS — A419 Sepsis, unspecified organism: Secondary | ICD-10-CM | POA: Diagnosis not present

## 2021-05-07 DIAGNOSIS — R532 Functional quadriplegia: Secondary | ICD-10-CM | POA: Diagnosis not present

## 2021-05-09 DIAGNOSIS — R532 Functional quadriplegia: Secondary | ICD-10-CM | POA: Diagnosis not present

## 2021-05-09 DIAGNOSIS — A419 Sepsis, unspecified organism: Secondary | ICD-10-CM | POA: Diagnosis not present

## 2021-05-09 DIAGNOSIS — M6258 Muscle wasting and atrophy, not elsewhere classified, other site: Secondary | ICD-10-CM | POA: Diagnosis not present

## 2021-05-09 DIAGNOSIS — M6281 Muscle weakness (generalized): Secondary | ICD-10-CM | POA: Diagnosis not present

## 2021-05-09 DIAGNOSIS — M6259 Muscle wasting and atrophy, not elsewhere classified, multiple sites: Secondary | ICD-10-CM | POA: Diagnosis not present

## 2021-05-09 DIAGNOSIS — R2681 Unsteadiness on feet: Secondary | ICD-10-CM | POA: Diagnosis not present

## 2021-05-10 DIAGNOSIS — M6259 Muscle wasting and atrophy, not elsewhere classified, multiple sites: Secondary | ICD-10-CM | POA: Diagnosis not present

## 2021-05-10 DIAGNOSIS — R2681 Unsteadiness on feet: Secondary | ICD-10-CM | POA: Diagnosis not present

## 2021-05-10 DIAGNOSIS — R532 Functional quadriplegia: Secondary | ICD-10-CM | POA: Diagnosis not present

## 2021-05-10 DIAGNOSIS — M6281 Muscle weakness (generalized): Secondary | ICD-10-CM | POA: Diagnosis not present

## 2021-05-10 DIAGNOSIS — M6258 Muscle wasting and atrophy, not elsewhere classified, other site: Secondary | ICD-10-CM | POA: Diagnosis not present

## 2021-05-10 DIAGNOSIS — A419 Sepsis, unspecified organism: Secondary | ICD-10-CM | POA: Diagnosis not present

## 2021-05-11 DIAGNOSIS — L89154 Pressure ulcer of sacral region, stage 4: Secondary | ICD-10-CM | POA: Diagnosis not present

## 2021-05-11 DIAGNOSIS — K592 Neurogenic bowel, not elsewhere classified: Secondary | ICD-10-CM | POA: Diagnosis not present

## 2021-05-11 DIAGNOSIS — D0461 Carcinoma in situ of skin of right upper limb, including shoulder: Secondary | ICD-10-CM | POA: Diagnosis not present

## 2021-05-11 DIAGNOSIS — M6259 Muscle wasting and atrophy, not elsewhere classified, multiple sites: Secondary | ICD-10-CM | POA: Diagnosis not present

## 2021-05-11 DIAGNOSIS — R140 Abdominal distension (gaseous): Secondary | ICD-10-CM | POA: Diagnosis not present

## 2021-05-11 DIAGNOSIS — R2681 Unsteadiness on feet: Secondary | ICD-10-CM | POA: Diagnosis not present

## 2021-05-11 DIAGNOSIS — L89214 Pressure ulcer of right hip, stage 4: Secondary | ICD-10-CM | POA: Diagnosis not present

## 2021-05-11 DIAGNOSIS — M6281 Muscle weakness (generalized): Secondary | ICD-10-CM | POA: Diagnosis not present

## 2021-05-11 DIAGNOSIS — M6258 Muscle wasting and atrophy, not elsewhere classified, other site: Secondary | ICD-10-CM | POA: Diagnosis not present

## 2021-05-11 DIAGNOSIS — E118 Type 2 diabetes mellitus with unspecified complications: Secondary | ICD-10-CM | POA: Diagnosis not present

## 2021-05-11 DIAGNOSIS — R109 Unspecified abdominal pain: Secondary | ICD-10-CM | POA: Diagnosis not present

## 2021-05-11 DIAGNOSIS — L988 Other specified disorders of the skin and subcutaneous tissue: Secondary | ICD-10-CM | POA: Diagnosis not present

## 2021-05-11 DIAGNOSIS — A419 Sepsis, unspecified organism: Secondary | ICD-10-CM | POA: Diagnosis not present

## 2021-05-11 DIAGNOSIS — R532 Functional quadriplegia: Secondary | ICD-10-CM | POA: Diagnosis not present

## 2021-05-12 DIAGNOSIS — R532 Functional quadriplegia: Secondary | ICD-10-CM | POA: Diagnosis not present

## 2021-05-12 DIAGNOSIS — A419 Sepsis, unspecified organism: Secondary | ICD-10-CM | POA: Diagnosis not present

## 2021-05-12 DIAGNOSIS — K592 Neurogenic bowel, not elsewhere classified: Secondary | ICD-10-CM | POA: Diagnosis not present

## 2021-05-12 DIAGNOSIS — Z933 Colostomy status: Secondary | ICD-10-CM | POA: Diagnosis not present

## 2021-05-12 DIAGNOSIS — R2681 Unsteadiness on feet: Secondary | ICD-10-CM | POA: Diagnosis not present

## 2021-05-12 DIAGNOSIS — M6259 Muscle wasting and atrophy, not elsewhere classified, multiple sites: Secondary | ICD-10-CM | POA: Diagnosis not present

## 2021-05-12 DIAGNOSIS — M6281 Muscle weakness (generalized): Secondary | ICD-10-CM | POA: Diagnosis not present

## 2021-05-12 DIAGNOSIS — D649 Anemia, unspecified: Secondary | ICD-10-CM | POA: Diagnosis not present

## 2021-05-12 DIAGNOSIS — M6258 Muscle wasting and atrophy, not elsewhere classified, other site: Secondary | ICD-10-CM | POA: Diagnosis not present

## 2021-05-12 DIAGNOSIS — R140 Abdominal distension (gaseous): Secondary | ICD-10-CM | POA: Diagnosis not present

## 2021-05-12 DIAGNOSIS — R110 Nausea: Secondary | ICD-10-CM | POA: Diagnosis not present

## 2021-05-13 DIAGNOSIS — M6259 Muscle wasting and atrophy, not elsewhere classified, multiple sites: Secondary | ICD-10-CM | POA: Diagnosis not present

## 2021-05-13 DIAGNOSIS — M6258 Muscle wasting and atrophy, not elsewhere classified, other site: Secondary | ICD-10-CM | POA: Diagnosis not present

## 2021-05-13 DIAGNOSIS — M6281 Muscle weakness (generalized): Secondary | ICD-10-CM | POA: Diagnosis not present

## 2021-05-13 DIAGNOSIS — R532 Functional quadriplegia: Secondary | ICD-10-CM | POA: Diagnosis not present

## 2021-05-13 DIAGNOSIS — R2681 Unsteadiness on feet: Secondary | ICD-10-CM | POA: Diagnosis not present

## 2021-05-13 DIAGNOSIS — A419 Sepsis, unspecified organism: Secondary | ICD-10-CM | POA: Diagnosis not present

## 2021-05-13 DIAGNOSIS — Z760 Encounter for issue of repeat prescription: Secondary | ICD-10-CM | POA: Diagnosis not present

## 2021-05-13 DIAGNOSIS — R634 Abnormal weight loss: Secondary | ICD-10-CM | POA: Diagnosis not present

## 2021-05-14 DIAGNOSIS — R2681 Unsteadiness on feet: Secondary | ICD-10-CM | POA: Diagnosis not present

## 2021-05-14 DIAGNOSIS — M6258 Muscle wasting and atrophy, not elsewhere classified, other site: Secondary | ICD-10-CM | POA: Diagnosis not present

## 2021-05-14 DIAGNOSIS — M6259 Muscle wasting and atrophy, not elsewhere classified, multiple sites: Secondary | ICD-10-CM | POA: Diagnosis not present

## 2021-05-14 DIAGNOSIS — M6281 Muscle weakness (generalized): Secondary | ICD-10-CM | POA: Diagnosis not present

## 2021-05-14 DIAGNOSIS — R532 Functional quadriplegia: Secondary | ICD-10-CM | POA: Diagnosis not present

## 2021-05-14 DIAGNOSIS — A419 Sepsis, unspecified organism: Secondary | ICD-10-CM | POA: Diagnosis not present

## 2021-05-16 DIAGNOSIS — A419 Sepsis, unspecified organism: Secondary | ICD-10-CM | POA: Diagnosis not present

## 2021-05-16 DIAGNOSIS — R532 Functional quadriplegia: Secondary | ICD-10-CM | POA: Diagnosis not present

## 2021-05-16 DIAGNOSIS — R2681 Unsteadiness on feet: Secondary | ICD-10-CM | POA: Diagnosis not present

## 2021-05-16 DIAGNOSIS — M6259 Muscle wasting and atrophy, not elsewhere classified, multiple sites: Secondary | ICD-10-CM | POA: Diagnosis not present

## 2021-05-16 DIAGNOSIS — M6258 Muscle wasting and atrophy, not elsewhere classified, other site: Secondary | ICD-10-CM | POA: Diagnosis not present

## 2021-05-16 DIAGNOSIS — M6281 Muscle weakness (generalized): Secondary | ICD-10-CM | POA: Diagnosis not present

## 2021-05-17 DIAGNOSIS — M6259 Muscle wasting and atrophy, not elsewhere classified, multiple sites: Secondary | ICD-10-CM | POA: Diagnosis not present

## 2021-05-17 DIAGNOSIS — R532 Functional quadriplegia: Secondary | ICD-10-CM | POA: Diagnosis not present

## 2021-05-17 DIAGNOSIS — M6258 Muscle wasting and atrophy, not elsewhere classified, other site: Secondary | ICD-10-CM | POA: Diagnosis not present

## 2021-05-17 DIAGNOSIS — M6281 Muscle weakness (generalized): Secondary | ICD-10-CM | POA: Diagnosis not present

## 2021-05-17 DIAGNOSIS — R2681 Unsteadiness on feet: Secondary | ICD-10-CM | POA: Diagnosis not present

## 2021-05-17 DIAGNOSIS — A419 Sepsis, unspecified organism: Secondary | ICD-10-CM | POA: Diagnosis not present

## 2021-05-18 DIAGNOSIS — G373 Acute transverse myelitis in demyelinating disease of central nervous system: Secondary | ICD-10-CM | POA: Diagnosis not present

## 2021-05-18 DIAGNOSIS — E1159 Type 2 diabetes mellitus with other circulatory complications: Secondary | ICD-10-CM | POA: Diagnosis not present

## 2021-05-18 DIAGNOSIS — A419 Sepsis, unspecified organism: Secondary | ICD-10-CM | POA: Diagnosis not present

## 2021-05-18 DIAGNOSIS — D649 Anemia, unspecified: Secondary | ICD-10-CM | POA: Diagnosis not present

## 2021-05-18 DIAGNOSIS — M6258 Muscle wasting and atrophy, not elsewhere classified, other site: Secondary | ICD-10-CM | POA: Diagnosis not present

## 2021-05-18 DIAGNOSIS — N319 Neuromuscular dysfunction of bladder, unspecified: Secondary | ICD-10-CM | POA: Diagnosis not present

## 2021-05-18 DIAGNOSIS — R532 Functional quadriplegia: Secondary | ICD-10-CM | POA: Diagnosis not present

## 2021-05-18 DIAGNOSIS — R2681 Unsteadiness on feet: Secondary | ICD-10-CM | POA: Diagnosis not present

## 2021-05-18 DIAGNOSIS — L988 Other specified disorders of the skin and subcutaneous tissue: Secondary | ICD-10-CM | POA: Diagnosis not present

## 2021-05-18 DIAGNOSIS — L89154 Pressure ulcer of sacral region, stage 4: Secondary | ICD-10-CM | POA: Diagnosis not present

## 2021-05-18 DIAGNOSIS — M6281 Muscle weakness (generalized): Secondary | ICD-10-CM | POA: Diagnosis not present

## 2021-05-18 DIAGNOSIS — L89214 Pressure ulcer of right hip, stage 4: Secondary | ICD-10-CM | POA: Diagnosis not present

## 2021-05-18 DIAGNOSIS — M6259 Muscle wasting and atrophy, not elsewhere classified, multiple sites: Secondary | ICD-10-CM | POA: Diagnosis not present

## 2021-05-19 DIAGNOSIS — M6259 Muscle wasting and atrophy, not elsewhere classified, multiple sites: Secondary | ICD-10-CM | POA: Diagnosis not present

## 2021-05-19 DIAGNOSIS — M6258 Muscle wasting and atrophy, not elsewhere classified, other site: Secondary | ICD-10-CM | POA: Diagnosis not present

## 2021-05-19 DIAGNOSIS — M6281 Muscle weakness (generalized): Secondary | ICD-10-CM | POA: Diagnosis not present

## 2021-05-19 DIAGNOSIS — R2681 Unsteadiness on feet: Secondary | ICD-10-CM | POA: Diagnosis not present

## 2021-05-19 DIAGNOSIS — R532 Functional quadriplegia: Secondary | ICD-10-CM | POA: Diagnosis not present

## 2021-05-19 DIAGNOSIS — A419 Sepsis, unspecified organism: Secondary | ICD-10-CM | POA: Diagnosis not present

## 2021-05-20 DIAGNOSIS — R41841 Cognitive communication deficit: Secondary | ICD-10-CM | POA: Diagnosis not present

## 2021-05-20 DIAGNOSIS — G8250 Quadriplegia, unspecified: Secondary | ICD-10-CM | POA: Diagnosis not present

## 2021-05-20 DIAGNOSIS — Z7409 Other reduced mobility: Secondary | ICD-10-CM | POA: Diagnosis not present

## 2021-05-20 DIAGNOSIS — G373 Acute transverse myelitis in demyelinating disease of central nervous system: Secondary | ICD-10-CM | POA: Diagnosis not present

## 2021-05-20 DIAGNOSIS — M6259 Muscle wasting and atrophy, not elsewhere classified, multiple sites: Secondary | ICD-10-CM | POA: Diagnosis not present

## 2021-05-20 DIAGNOSIS — R532 Functional quadriplegia: Secondary | ICD-10-CM | POA: Diagnosis not present

## 2021-05-20 DIAGNOSIS — R278 Other lack of coordination: Secondary | ICD-10-CM | POA: Diagnosis not present

## 2021-05-20 DIAGNOSIS — R1312 Dysphagia, oropharyngeal phase: Secondary | ICD-10-CM | POA: Diagnosis not present

## 2021-05-20 DIAGNOSIS — R2681 Unsteadiness on feet: Secondary | ICD-10-CM | POA: Diagnosis not present

## 2021-05-20 DIAGNOSIS — M6258 Muscle wasting and atrophy, not elsewhere classified, other site: Secondary | ICD-10-CM | POA: Diagnosis not present

## 2021-05-20 DIAGNOSIS — M6281 Muscle weakness (generalized): Secondary | ICD-10-CM | POA: Diagnosis not present

## 2021-05-20 DIAGNOSIS — A419 Sepsis, unspecified organism: Secondary | ICD-10-CM | POA: Diagnosis not present

## 2021-05-21 DIAGNOSIS — M6258 Muscle wasting and atrophy, not elsewhere classified, other site: Secondary | ICD-10-CM | POA: Diagnosis not present

## 2021-05-21 DIAGNOSIS — A419 Sepsis, unspecified organism: Secondary | ICD-10-CM | POA: Diagnosis not present

## 2021-05-21 DIAGNOSIS — R2681 Unsteadiness on feet: Secondary | ICD-10-CM | POA: Diagnosis not present

## 2021-05-21 DIAGNOSIS — M6281 Muscle weakness (generalized): Secondary | ICD-10-CM | POA: Diagnosis not present

## 2021-05-21 DIAGNOSIS — M6259 Muscle wasting and atrophy, not elsewhere classified, multiple sites: Secondary | ICD-10-CM | POA: Diagnosis not present

## 2021-05-21 DIAGNOSIS — R532 Functional quadriplegia: Secondary | ICD-10-CM | POA: Diagnosis not present

## 2021-05-24 DIAGNOSIS — A419 Sepsis, unspecified organism: Secondary | ICD-10-CM | POA: Diagnosis not present

## 2021-05-24 DIAGNOSIS — M6281 Muscle weakness (generalized): Secondary | ICD-10-CM | POA: Diagnosis not present

## 2021-05-24 DIAGNOSIS — R532 Functional quadriplegia: Secondary | ICD-10-CM | POA: Diagnosis not present

## 2021-05-24 DIAGNOSIS — R2681 Unsteadiness on feet: Secondary | ICD-10-CM | POA: Diagnosis not present

## 2021-05-24 DIAGNOSIS — M6259 Muscle wasting and atrophy, not elsewhere classified, multiple sites: Secondary | ICD-10-CM | POA: Diagnosis not present

## 2021-05-24 DIAGNOSIS — M6258 Muscle wasting and atrophy, not elsewhere classified, other site: Secondary | ICD-10-CM | POA: Diagnosis not present

## 2021-05-25 DIAGNOSIS — R532 Functional quadriplegia: Secondary | ICD-10-CM | POA: Diagnosis not present

## 2021-05-25 DIAGNOSIS — M6258 Muscle wasting and atrophy, not elsewhere classified, other site: Secondary | ICD-10-CM | POA: Diagnosis not present

## 2021-05-25 DIAGNOSIS — A419 Sepsis, unspecified organism: Secondary | ICD-10-CM | POA: Diagnosis not present

## 2021-05-25 DIAGNOSIS — M6259 Muscle wasting and atrophy, not elsewhere classified, multiple sites: Secondary | ICD-10-CM | POA: Diagnosis not present

## 2021-05-25 DIAGNOSIS — M6281 Muscle weakness (generalized): Secondary | ICD-10-CM | POA: Diagnosis not present

## 2021-05-25 DIAGNOSIS — R2681 Unsteadiness on feet: Secondary | ICD-10-CM | POA: Diagnosis not present

## 2021-05-26 DIAGNOSIS — R2681 Unsteadiness on feet: Secondary | ICD-10-CM | POA: Diagnosis not present

## 2021-05-26 DIAGNOSIS — A419 Sepsis, unspecified organism: Secondary | ICD-10-CM | POA: Diagnosis not present

## 2021-05-26 DIAGNOSIS — M6259 Muscle wasting and atrophy, not elsewhere classified, multiple sites: Secondary | ICD-10-CM | POA: Diagnosis not present

## 2021-05-26 DIAGNOSIS — M6258 Muscle wasting and atrophy, not elsewhere classified, other site: Secondary | ICD-10-CM | POA: Diagnosis not present

## 2021-05-26 DIAGNOSIS — R532 Functional quadriplegia: Secondary | ICD-10-CM | POA: Diagnosis not present

## 2021-05-26 DIAGNOSIS — M6281 Muscle weakness (generalized): Secondary | ICD-10-CM | POA: Diagnosis not present

## 2021-05-27 DIAGNOSIS — R532 Functional quadriplegia: Secondary | ICD-10-CM | POA: Diagnosis not present

## 2021-05-27 DIAGNOSIS — R002 Palpitations: Secondary | ICD-10-CM | POA: Diagnosis not present

## 2021-05-27 DIAGNOSIS — R519 Headache, unspecified: Secondary | ICD-10-CM | POA: Diagnosis not present

## 2021-05-27 DIAGNOSIS — A419 Sepsis, unspecified organism: Secondary | ICD-10-CM | POA: Diagnosis not present

## 2021-05-27 DIAGNOSIS — E1159 Type 2 diabetes mellitus with other circulatory complications: Secondary | ICD-10-CM | POA: Diagnosis not present

## 2021-05-27 DIAGNOSIS — I152 Hypertension secondary to endocrine disorders: Secondary | ICD-10-CM | POA: Diagnosis not present

## 2021-05-27 DIAGNOSIS — M6258 Muscle wasting and atrophy, not elsewhere classified, other site: Secondary | ICD-10-CM | POA: Diagnosis not present

## 2021-05-27 DIAGNOSIS — M6259 Muscle wasting and atrophy, not elsewhere classified, multiple sites: Secondary | ICD-10-CM | POA: Diagnosis not present

## 2021-05-27 DIAGNOSIS — M6281 Muscle weakness (generalized): Secondary | ICD-10-CM | POA: Diagnosis not present

## 2021-05-27 DIAGNOSIS — R2681 Unsteadiness on feet: Secondary | ICD-10-CM | POA: Diagnosis not present

## 2021-05-28 DIAGNOSIS — M6258 Muscle wasting and atrophy, not elsewhere classified, other site: Secondary | ICD-10-CM | POA: Diagnosis not present

## 2021-05-28 DIAGNOSIS — M6281 Muscle weakness (generalized): Secondary | ICD-10-CM | POA: Diagnosis not present

## 2021-05-28 DIAGNOSIS — A419 Sepsis, unspecified organism: Secondary | ICD-10-CM | POA: Diagnosis not present

## 2021-05-28 DIAGNOSIS — M6259 Muscle wasting and atrophy, not elsewhere classified, multiple sites: Secondary | ICD-10-CM | POA: Diagnosis not present

## 2021-05-28 DIAGNOSIS — R2681 Unsteadiness on feet: Secondary | ICD-10-CM | POA: Diagnosis not present

## 2021-05-28 DIAGNOSIS — R532 Functional quadriplegia: Secondary | ICD-10-CM | POA: Diagnosis not present

## 2021-05-29 DIAGNOSIS — E039 Hypothyroidism, unspecified: Secondary | ICD-10-CM | POA: Diagnosis not present

## 2021-05-29 DIAGNOSIS — R319 Hematuria, unspecified: Secondary | ICD-10-CM | POA: Diagnosis not present

## 2021-05-30 DIAGNOSIS — M6258 Muscle wasting and atrophy, not elsewhere classified, other site: Secondary | ICD-10-CM | POA: Diagnosis not present

## 2021-05-30 DIAGNOSIS — R532 Functional quadriplegia: Secondary | ICD-10-CM | POA: Diagnosis not present

## 2021-05-30 DIAGNOSIS — M6281 Muscle weakness (generalized): Secondary | ICD-10-CM | POA: Diagnosis not present

## 2021-05-30 DIAGNOSIS — M6259 Muscle wasting and atrophy, not elsewhere classified, multiple sites: Secondary | ICD-10-CM | POA: Diagnosis not present

## 2021-05-30 DIAGNOSIS — R2681 Unsteadiness on feet: Secondary | ICD-10-CM | POA: Diagnosis not present

## 2021-05-30 DIAGNOSIS — A419 Sepsis, unspecified organism: Secondary | ICD-10-CM | POA: Diagnosis not present

## 2021-05-31 DIAGNOSIS — F331 Major depressive disorder, recurrent, moderate: Secondary | ICD-10-CM | POA: Diagnosis not present

## 2021-05-31 DIAGNOSIS — F5101 Primary insomnia: Secondary | ICD-10-CM | POA: Diagnosis not present

## 2021-05-31 DIAGNOSIS — F411 Generalized anxiety disorder: Secondary | ICD-10-CM | POA: Diagnosis not present

## 2021-06-01 DIAGNOSIS — L89154 Pressure ulcer of sacral region, stage 4: Secondary | ICD-10-CM | POA: Diagnosis not present

## 2021-06-01 DIAGNOSIS — M6281 Muscle weakness (generalized): Secondary | ICD-10-CM | POA: Diagnosis not present

## 2021-06-01 DIAGNOSIS — L89214 Pressure ulcer of right hip, stage 4: Secondary | ICD-10-CM | POA: Diagnosis not present

## 2021-06-01 DIAGNOSIS — R2681 Unsteadiness on feet: Secondary | ICD-10-CM | POA: Diagnosis not present

## 2021-06-01 DIAGNOSIS — M6259 Muscle wasting and atrophy, not elsewhere classified, multiple sites: Secondary | ICD-10-CM | POA: Diagnosis not present

## 2021-06-01 DIAGNOSIS — R532 Functional quadriplegia: Secondary | ICD-10-CM | POA: Diagnosis not present

## 2021-06-01 DIAGNOSIS — G373 Acute transverse myelitis in demyelinating disease of central nervous system: Secondary | ICD-10-CM | POA: Diagnosis not present

## 2021-06-01 DIAGNOSIS — M6258 Muscle wasting and atrophy, not elsewhere classified, other site: Secondary | ICD-10-CM | POA: Diagnosis not present

## 2021-06-01 DIAGNOSIS — A419 Sepsis, unspecified organism: Secondary | ICD-10-CM | POA: Diagnosis not present

## 2021-06-01 DIAGNOSIS — R627 Adult failure to thrive: Secondary | ICD-10-CM | POA: Diagnosis not present

## 2021-06-03 DIAGNOSIS — A419 Sepsis, unspecified organism: Secondary | ICD-10-CM | POA: Diagnosis not present

## 2021-06-03 DIAGNOSIS — M6258 Muscle wasting and atrophy, not elsewhere classified, other site: Secondary | ICD-10-CM | POA: Diagnosis not present

## 2021-06-03 DIAGNOSIS — M6281 Muscle weakness (generalized): Secondary | ICD-10-CM | POA: Diagnosis not present

## 2021-06-03 DIAGNOSIS — R52 Pain, unspecified: Secondary | ICD-10-CM | POA: Diagnosis not present

## 2021-06-03 DIAGNOSIS — E118 Type 2 diabetes mellitus with unspecified complications: Secondary | ICD-10-CM | POA: Diagnosis not present

## 2021-06-03 DIAGNOSIS — R2681 Unsteadiness on feet: Secondary | ICD-10-CM | POA: Diagnosis not present

## 2021-06-03 DIAGNOSIS — R532 Functional quadriplegia: Secondary | ICD-10-CM | POA: Diagnosis not present

## 2021-06-03 DIAGNOSIS — D509 Iron deficiency anemia, unspecified: Secondary | ICD-10-CM | POA: Diagnosis not present

## 2021-06-03 DIAGNOSIS — M6259 Muscle wasting and atrophy, not elsewhere classified, multiple sites: Secondary | ICD-10-CM | POA: Diagnosis not present

## 2021-06-06 DIAGNOSIS — M6281 Muscle weakness (generalized): Secondary | ICD-10-CM | POA: Diagnosis not present

## 2021-06-06 DIAGNOSIS — R532 Functional quadriplegia: Secondary | ICD-10-CM | POA: Diagnosis not present

## 2021-06-06 DIAGNOSIS — R2681 Unsteadiness on feet: Secondary | ICD-10-CM | POA: Diagnosis not present

## 2021-06-06 DIAGNOSIS — M6258 Muscle wasting and atrophy, not elsewhere classified, other site: Secondary | ICD-10-CM | POA: Diagnosis not present

## 2021-06-06 DIAGNOSIS — M6259 Muscle wasting and atrophy, not elsewhere classified, multiple sites: Secondary | ICD-10-CM | POA: Diagnosis not present

## 2021-06-06 DIAGNOSIS — A419 Sepsis, unspecified organism: Secondary | ICD-10-CM | POA: Diagnosis not present

## 2021-06-08 DIAGNOSIS — A419 Sepsis, unspecified organism: Secondary | ICD-10-CM | POA: Diagnosis not present

## 2021-06-08 DIAGNOSIS — L89214 Pressure ulcer of right hip, stage 4: Secondary | ICD-10-CM | POA: Diagnosis not present

## 2021-06-08 DIAGNOSIS — R532 Functional quadriplegia: Secondary | ICD-10-CM | POA: Diagnosis not present

## 2021-06-08 DIAGNOSIS — M6281 Muscle weakness (generalized): Secondary | ICD-10-CM | POA: Diagnosis not present

## 2021-06-08 DIAGNOSIS — L89154 Pressure ulcer of sacral region, stage 4: Secondary | ICD-10-CM | POA: Diagnosis not present

## 2021-06-08 DIAGNOSIS — R2681 Unsteadiness on feet: Secondary | ICD-10-CM | POA: Diagnosis not present

## 2021-06-08 DIAGNOSIS — M6259 Muscle wasting and atrophy, not elsewhere classified, multiple sites: Secondary | ICD-10-CM | POA: Diagnosis not present

## 2021-06-08 DIAGNOSIS — M6258 Muscle wasting and atrophy, not elsewhere classified, other site: Secondary | ICD-10-CM | POA: Diagnosis not present

## 2021-06-10 DIAGNOSIS — M6281 Muscle weakness (generalized): Secondary | ICD-10-CM | POA: Diagnosis not present

## 2021-06-10 DIAGNOSIS — R2681 Unsteadiness on feet: Secondary | ICD-10-CM | POA: Diagnosis not present

## 2021-06-10 DIAGNOSIS — R532 Functional quadriplegia: Secondary | ICD-10-CM | POA: Diagnosis not present

## 2021-06-10 DIAGNOSIS — A419 Sepsis, unspecified organism: Secondary | ICD-10-CM | POA: Diagnosis not present

## 2021-06-10 DIAGNOSIS — M6258 Muscle wasting and atrophy, not elsewhere classified, other site: Secondary | ICD-10-CM | POA: Diagnosis not present

## 2021-06-10 DIAGNOSIS — M6259 Muscle wasting and atrophy, not elsewhere classified, multiple sites: Secondary | ICD-10-CM | POA: Diagnosis not present

## 2021-06-12 DIAGNOSIS — M6258 Muscle wasting and atrophy, not elsewhere classified, other site: Secondary | ICD-10-CM | POA: Diagnosis not present

## 2021-06-12 DIAGNOSIS — R2681 Unsteadiness on feet: Secondary | ICD-10-CM | POA: Diagnosis not present

## 2021-06-12 DIAGNOSIS — M6259 Muscle wasting and atrophy, not elsewhere classified, multiple sites: Secondary | ICD-10-CM | POA: Diagnosis not present

## 2021-06-12 DIAGNOSIS — M6281 Muscle weakness (generalized): Secondary | ICD-10-CM | POA: Diagnosis not present

## 2021-06-12 DIAGNOSIS — R532 Functional quadriplegia: Secondary | ICD-10-CM | POA: Diagnosis not present

## 2021-06-12 DIAGNOSIS — A419 Sepsis, unspecified organism: Secondary | ICD-10-CM | POA: Diagnosis not present

## 2021-06-14 DIAGNOSIS — A419 Sepsis, unspecified organism: Secondary | ICD-10-CM | POA: Diagnosis not present

## 2021-06-14 DIAGNOSIS — M6259 Muscle wasting and atrophy, not elsewhere classified, multiple sites: Secondary | ICD-10-CM | POA: Diagnosis not present

## 2021-06-14 DIAGNOSIS — M6258 Muscle wasting and atrophy, not elsewhere classified, other site: Secondary | ICD-10-CM | POA: Diagnosis not present

## 2021-06-14 DIAGNOSIS — R2681 Unsteadiness on feet: Secondary | ICD-10-CM | POA: Diagnosis not present

## 2021-06-14 DIAGNOSIS — R532 Functional quadriplegia: Secondary | ICD-10-CM | POA: Diagnosis not present

## 2021-06-14 DIAGNOSIS — M6281 Muscle weakness (generalized): Secondary | ICD-10-CM | POA: Diagnosis not present

## 2021-06-15 DIAGNOSIS — L89214 Pressure ulcer of right hip, stage 4: Secondary | ICD-10-CM | POA: Diagnosis not present

## 2021-06-15 DIAGNOSIS — L89154 Pressure ulcer of sacral region, stage 4: Secondary | ICD-10-CM | POA: Diagnosis not present

## 2021-06-15 DIAGNOSIS — R110 Nausea: Secondary | ICD-10-CM | POA: Diagnosis not present

## 2021-06-16 DIAGNOSIS — M6281 Muscle weakness (generalized): Secondary | ICD-10-CM | POA: Diagnosis not present

## 2021-06-16 DIAGNOSIS — M6259 Muscle wasting and atrophy, not elsewhere classified, multiple sites: Secondary | ICD-10-CM | POA: Diagnosis not present

## 2021-06-16 DIAGNOSIS — R532 Functional quadriplegia: Secondary | ICD-10-CM | POA: Diagnosis not present

## 2021-06-16 DIAGNOSIS — A419 Sepsis, unspecified organism: Secondary | ICD-10-CM | POA: Diagnosis not present

## 2021-06-16 DIAGNOSIS — M6258 Muscle wasting and atrophy, not elsewhere classified, other site: Secondary | ICD-10-CM | POA: Diagnosis not present

## 2021-06-16 DIAGNOSIS — R2681 Unsteadiness on feet: Secondary | ICD-10-CM | POA: Diagnosis not present

## 2021-06-17 DIAGNOSIS — M6258 Muscle wasting and atrophy, not elsewhere classified, other site: Secondary | ICD-10-CM | POA: Diagnosis not present

## 2021-06-17 DIAGNOSIS — R532 Functional quadriplegia: Secondary | ICD-10-CM | POA: Diagnosis not present

## 2021-06-17 DIAGNOSIS — A419 Sepsis, unspecified organism: Secondary | ICD-10-CM | POA: Diagnosis not present

## 2021-06-17 DIAGNOSIS — R2681 Unsteadiness on feet: Secondary | ICD-10-CM | POA: Diagnosis not present

## 2021-06-17 DIAGNOSIS — M6281 Muscle weakness (generalized): Secondary | ICD-10-CM | POA: Diagnosis not present

## 2021-06-17 DIAGNOSIS — M6259 Muscle wasting and atrophy, not elsewhere classified, multiple sites: Secondary | ICD-10-CM | POA: Diagnosis not present

## 2021-06-21 DIAGNOSIS — E119 Type 2 diabetes mellitus without complications: Secondary | ICD-10-CM | POA: Diagnosis not present

## 2021-06-22 DIAGNOSIS — R278 Other lack of coordination: Secondary | ICD-10-CM | POA: Diagnosis not present

## 2021-06-22 DIAGNOSIS — R532 Functional quadriplegia: Secondary | ICD-10-CM | POA: Diagnosis not present

## 2021-06-22 DIAGNOSIS — Z7409 Other reduced mobility: Secondary | ICD-10-CM | POA: Diagnosis not present

## 2021-06-22 DIAGNOSIS — L89154 Pressure ulcer of sacral region, stage 4: Secondary | ICD-10-CM | POA: Diagnosis not present

## 2021-06-22 DIAGNOSIS — M6258 Muscle wasting and atrophy, not elsewhere classified, other site: Secondary | ICD-10-CM | POA: Diagnosis not present

## 2021-06-22 DIAGNOSIS — M6259 Muscle wasting and atrophy, not elsewhere classified, multiple sites: Secondary | ICD-10-CM | POA: Diagnosis not present

## 2021-06-22 DIAGNOSIS — R1312 Dysphagia, oropharyngeal phase: Secondary | ICD-10-CM | POA: Diagnosis not present

## 2021-06-22 DIAGNOSIS — R41841 Cognitive communication deficit: Secondary | ICD-10-CM | POA: Diagnosis not present

## 2021-06-22 DIAGNOSIS — L89214 Pressure ulcer of right hip, stage 4: Secondary | ICD-10-CM | POA: Diagnosis not present

## 2021-06-22 DIAGNOSIS — M6281 Muscle weakness (generalized): Secondary | ICD-10-CM | POA: Diagnosis not present

## 2021-06-22 DIAGNOSIS — G8250 Quadriplegia, unspecified: Secondary | ICD-10-CM | POA: Diagnosis not present

## 2021-06-22 DIAGNOSIS — R2681 Unsteadiness on feet: Secondary | ICD-10-CM | POA: Diagnosis not present

## 2021-06-22 DIAGNOSIS — A419 Sepsis, unspecified organism: Secondary | ICD-10-CM | POA: Diagnosis not present

## 2021-06-22 DIAGNOSIS — G373 Acute transverse myelitis in demyelinating disease of central nervous system: Secondary | ICD-10-CM | POA: Diagnosis not present

## 2021-06-24 DIAGNOSIS — A419 Sepsis, unspecified organism: Secondary | ICD-10-CM | POA: Diagnosis not present

## 2021-06-24 DIAGNOSIS — M6258 Muscle wasting and atrophy, not elsewhere classified, other site: Secondary | ICD-10-CM | POA: Diagnosis not present

## 2021-06-24 DIAGNOSIS — M6259 Muscle wasting and atrophy, not elsewhere classified, multiple sites: Secondary | ICD-10-CM | POA: Diagnosis not present

## 2021-06-24 DIAGNOSIS — R1312 Dysphagia, oropharyngeal phase: Secondary | ICD-10-CM | POA: Diagnosis not present

## 2021-06-24 DIAGNOSIS — M6281 Muscle weakness (generalized): Secondary | ICD-10-CM | POA: Diagnosis not present

## 2021-06-24 DIAGNOSIS — R2681 Unsteadiness on feet: Secondary | ICD-10-CM | POA: Diagnosis not present

## 2021-06-25 DIAGNOSIS — M6259 Muscle wasting and atrophy, not elsewhere classified, multiple sites: Secondary | ICD-10-CM | POA: Diagnosis not present

## 2021-06-25 DIAGNOSIS — M6281 Muscle weakness (generalized): Secondary | ICD-10-CM | POA: Diagnosis not present

## 2021-06-25 DIAGNOSIS — M6258 Muscle wasting and atrophy, not elsewhere classified, other site: Secondary | ICD-10-CM | POA: Diagnosis not present

## 2021-06-25 DIAGNOSIS — A419 Sepsis, unspecified organism: Secondary | ICD-10-CM | POA: Diagnosis not present

## 2021-06-25 DIAGNOSIS — R1312 Dysphagia, oropharyngeal phase: Secondary | ICD-10-CM | POA: Diagnosis not present

## 2021-06-25 DIAGNOSIS — R2681 Unsteadiness on feet: Secondary | ICD-10-CM | POA: Diagnosis not present

## 2021-06-28 DIAGNOSIS — R2681 Unsteadiness on feet: Secondary | ICD-10-CM | POA: Diagnosis not present

## 2021-06-28 DIAGNOSIS — M6259 Muscle wasting and atrophy, not elsewhere classified, multiple sites: Secondary | ICD-10-CM | POA: Diagnosis not present

## 2021-06-28 DIAGNOSIS — R1312 Dysphagia, oropharyngeal phase: Secondary | ICD-10-CM | POA: Diagnosis not present

## 2021-06-28 DIAGNOSIS — M6281 Muscle weakness (generalized): Secondary | ICD-10-CM | POA: Diagnosis not present

## 2021-06-28 DIAGNOSIS — M6258 Muscle wasting and atrophy, not elsewhere classified, other site: Secondary | ICD-10-CM | POA: Diagnosis not present

## 2021-06-28 DIAGNOSIS — A419 Sepsis, unspecified organism: Secondary | ICD-10-CM | POA: Diagnosis not present

## 2021-06-29 DIAGNOSIS — R2681 Unsteadiness on feet: Secondary | ICD-10-CM | POA: Diagnosis not present

## 2021-06-29 DIAGNOSIS — R1312 Dysphagia, oropharyngeal phase: Secondary | ICD-10-CM | POA: Diagnosis not present

## 2021-06-29 DIAGNOSIS — L89214 Pressure ulcer of right hip, stage 4: Secondary | ICD-10-CM | POA: Diagnosis not present

## 2021-06-29 DIAGNOSIS — M6258 Muscle wasting and atrophy, not elsewhere classified, other site: Secondary | ICD-10-CM | POA: Diagnosis not present

## 2021-06-29 DIAGNOSIS — A419 Sepsis, unspecified organism: Secondary | ICD-10-CM | POA: Diagnosis not present

## 2021-06-29 DIAGNOSIS — L89154 Pressure ulcer of sacral region, stage 4: Secondary | ICD-10-CM | POA: Diagnosis not present

## 2021-06-29 DIAGNOSIS — M6281 Muscle weakness (generalized): Secondary | ICD-10-CM | POA: Diagnosis not present

## 2021-06-29 DIAGNOSIS — M6259 Muscle wasting and atrophy, not elsewhere classified, multiple sites: Secondary | ICD-10-CM | POA: Diagnosis not present

## 2021-06-30 DIAGNOSIS — M6259 Muscle wasting and atrophy, not elsewhere classified, multiple sites: Secondary | ICD-10-CM | POA: Diagnosis not present

## 2021-06-30 DIAGNOSIS — R1312 Dysphagia, oropharyngeal phase: Secondary | ICD-10-CM | POA: Diagnosis not present

## 2021-06-30 DIAGNOSIS — M6281 Muscle weakness (generalized): Secondary | ICD-10-CM | POA: Diagnosis not present

## 2021-06-30 DIAGNOSIS — M6258 Muscle wasting and atrophy, not elsewhere classified, other site: Secondary | ICD-10-CM | POA: Diagnosis not present

## 2021-06-30 DIAGNOSIS — R2681 Unsteadiness on feet: Secondary | ICD-10-CM | POA: Diagnosis not present

## 2021-06-30 DIAGNOSIS — A419 Sepsis, unspecified organism: Secondary | ICD-10-CM | POA: Diagnosis not present

## 2021-07-01 DIAGNOSIS — M6258 Muscle wasting and atrophy, not elsewhere classified, other site: Secondary | ICD-10-CM | POA: Diagnosis not present

## 2021-07-01 DIAGNOSIS — R2681 Unsteadiness on feet: Secondary | ICD-10-CM | POA: Diagnosis not present

## 2021-07-01 DIAGNOSIS — R1312 Dysphagia, oropharyngeal phase: Secondary | ICD-10-CM | POA: Diagnosis not present

## 2021-07-01 DIAGNOSIS — A419 Sepsis, unspecified organism: Secondary | ICD-10-CM | POA: Diagnosis not present

## 2021-07-01 DIAGNOSIS — M6281 Muscle weakness (generalized): Secondary | ICD-10-CM | POA: Diagnosis not present

## 2021-07-01 DIAGNOSIS — M6259 Muscle wasting and atrophy, not elsewhere classified, multiple sites: Secondary | ICD-10-CM | POA: Diagnosis not present

## 2021-07-02 DIAGNOSIS — R1312 Dysphagia, oropharyngeal phase: Secondary | ICD-10-CM | POA: Diagnosis not present

## 2021-07-02 DIAGNOSIS — A419 Sepsis, unspecified organism: Secondary | ICD-10-CM | POA: Diagnosis not present

## 2021-07-02 DIAGNOSIS — R2681 Unsteadiness on feet: Secondary | ICD-10-CM | POA: Diagnosis not present

## 2021-07-02 DIAGNOSIS — M6258 Muscle wasting and atrophy, not elsewhere classified, other site: Secondary | ICD-10-CM | POA: Diagnosis not present

## 2021-07-02 DIAGNOSIS — M6281 Muscle weakness (generalized): Secondary | ICD-10-CM | POA: Diagnosis not present

## 2021-07-02 DIAGNOSIS — M6259 Muscle wasting and atrophy, not elsewhere classified, multiple sites: Secondary | ICD-10-CM | POA: Diagnosis not present

## 2021-07-05 DIAGNOSIS — R1312 Dysphagia, oropharyngeal phase: Secondary | ICD-10-CM | POA: Diagnosis not present

## 2021-07-05 DIAGNOSIS — R2681 Unsteadiness on feet: Secondary | ICD-10-CM | POA: Diagnosis not present

## 2021-07-05 DIAGNOSIS — M6259 Muscle wasting and atrophy, not elsewhere classified, multiple sites: Secondary | ICD-10-CM | POA: Diagnosis not present

## 2021-07-05 DIAGNOSIS — A419 Sepsis, unspecified organism: Secondary | ICD-10-CM | POA: Diagnosis not present

## 2021-07-05 DIAGNOSIS — M6281 Muscle weakness (generalized): Secondary | ICD-10-CM | POA: Diagnosis not present

## 2021-07-05 DIAGNOSIS — M6258 Muscle wasting and atrophy, not elsewhere classified, other site: Secondary | ICD-10-CM | POA: Diagnosis not present

## 2021-07-06 DIAGNOSIS — R0981 Nasal congestion: Secondary | ICD-10-CM | POA: Diagnosis not present

## 2021-07-06 DIAGNOSIS — M6281 Muscle weakness (generalized): Secondary | ICD-10-CM | POA: Diagnosis not present

## 2021-07-06 DIAGNOSIS — H6123 Impacted cerumen, bilateral: Secondary | ICD-10-CM | POA: Diagnosis not present

## 2021-07-06 DIAGNOSIS — R2681 Unsteadiness on feet: Secondary | ICD-10-CM | POA: Diagnosis not present

## 2021-07-06 DIAGNOSIS — M6259 Muscle wasting and atrophy, not elsewhere classified, multiple sites: Secondary | ICD-10-CM | POA: Diagnosis not present

## 2021-07-06 DIAGNOSIS — R1312 Dysphagia, oropharyngeal phase: Secondary | ICD-10-CM | POA: Diagnosis not present

## 2021-07-06 DIAGNOSIS — H1010 Acute atopic conjunctivitis, unspecified eye: Secondary | ICD-10-CM | POA: Diagnosis not present

## 2021-07-06 DIAGNOSIS — M6258 Muscle wasting and atrophy, not elsewhere classified, other site: Secondary | ICD-10-CM | POA: Diagnosis not present

## 2021-07-06 DIAGNOSIS — A419 Sepsis, unspecified organism: Secondary | ICD-10-CM | POA: Diagnosis not present

## 2021-07-06 DIAGNOSIS — L89214 Pressure ulcer of right hip, stage 4: Secondary | ICD-10-CM | POA: Diagnosis not present

## 2021-07-06 DIAGNOSIS — E1159 Type 2 diabetes mellitus with other circulatory complications: Secondary | ICD-10-CM | POA: Diagnosis not present

## 2021-07-06 DIAGNOSIS — L89154 Pressure ulcer of sacral region, stage 4: Secondary | ICD-10-CM | POA: Diagnosis not present

## 2021-07-07 DIAGNOSIS — R2681 Unsteadiness on feet: Secondary | ICD-10-CM | POA: Diagnosis not present

## 2021-07-07 DIAGNOSIS — R1312 Dysphagia, oropharyngeal phase: Secondary | ICD-10-CM | POA: Diagnosis not present

## 2021-07-07 DIAGNOSIS — M6281 Muscle weakness (generalized): Secondary | ICD-10-CM | POA: Diagnosis not present

## 2021-07-07 DIAGNOSIS — M6259 Muscle wasting and atrophy, not elsewhere classified, multiple sites: Secondary | ICD-10-CM | POA: Diagnosis not present

## 2021-07-07 DIAGNOSIS — A419 Sepsis, unspecified organism: Secondary | ICD-10-CM | POA: Diagnosis not present

## 2021-07-07 DIAGNOSIS — M6258 Muscle wasting and atrophy, not elsewhere classified, other site: Secondary | ICD-10-CM | POA: Diagnosis not present

## 2021-07-08 DIAGNOSIS — R1312 Dysphagia, oropharyngeal phase: Secondary | ICD-10-CM | POA: Diagnosis not present

## 2021-07-08 DIAGNOSIS — M6281 Muscle weakness (generalized): Secondary | ICD-10-CM | POA: Diagnosis not present

## 2021-07-08 DIAGNOSIS — M6259 Muscle wasting and atrophy, not elsewhere classified, multiple sites: Secondary | ICD-10-CM | POA: Diagnosis not present

## 2021-07-08 DIAGNOSIS — R2681 Unsteadiness on feet: Secondary | ICD-10-CM | POA: Diagnosis not present

## 2021-07-08 DIAGNOSIS — M6258 Muscle wasting and atrophy, not elsewhere classified, other site: Secondary | ICD-10-CM | POA: Diagnosis not present

## 2021-07-08 DIAGNOSIS — A419 Sepsis, unspecified organism: Secondary | ICD-10-CM | POA: Diagnosis not present

## 2021-07-09 DIAGNOSIS — H04129 Dry eye syndrome of unspecified lacrimal gland: Secondary | ICD-10-CM | POA: Diagnosis not present

## 2021-07-09 DIAGNOSIS — D649 Anemia, unspecified: Secondary | ICD-10-CM | POA: Diagnosis not present

## 2021-07-09 DIAGNOSIS — H938X9 Other specified disorders of ear, unspecified ear: Secondary | ICD-10-CM | POA: Diagnosis not present

## 2021-07-09 DIAGNOSIS — E119 Type 2 diabetes mellitus without complications: Secondary | ICD-10-CM | POA: Diagnosis not present

## 2021-07-13 DIAGNOSIS — A419 Sepsis, unspecified organism: Secondary | ICD-10-CM | POA: Diagnosis not present

## 2021-07-13 DIAGNOSIS — M6258 Muscle wasting and atrophy, not elsewhere classified, other site: Secondary | ICD-10-CM | POA: Diagnosis not present

## 2021-07-13 DIAGNOSIS — M6259 Muscle wasting and atrophy, not elsewhere classified, multiple sites: Secondary | ICD-10-CM | POA: Diagnosis not present

## 2021-07-13 DIAGNOSIS — R1312 Dysphagia, oropharyngeal phase: Secondary | ICD-10-CM | POA: Diagnosis not present

## 2021-07-13 DIAGNOSIS — R2681 Unsteadiness on feet: Secondary | ICD-10-CM | POA: Diagnosis not present

## 2021-07-13 DIAGNOSIS — L89214 Pressure ulcer of right hip, stage 4: Secondary | ICD-10-CM | POA: Diagnosis not present

## 2021-07-13 DIAGNOSIS — M6281 Muscle weakness (generalized): Secondary | ICD-10-CM | POA: Diagnosis not present

## 2021-07-13 DIAGNOSIS — L89154 Pressure ulcer of sacral region, stage 4: Secondary | ICD-10-CM | POA: Diagnosis not present

## 2021-07-14 DIAGNOSIS — R2681 Unsteadiness on feet: Secondary | ICD-10-CM | POA: Diagnosis not present

## 2021-07-14 DIAGNOSIS — A419 Sepsis, unspecified organism: Secondary | ICD-10-CM | POA: Diagnosis not present

## 2021-07-14 DIAGNOSIS — M6259 Muscle wasting and atrophy, not elsewhere classified, multiple sites: Secondary | ICD-10-CM | POA: Diagnosis not present

## 2021-07-14 DIAGNOSIS — M6258 Muscle wasting and atrophy, not elsewhere classified, other site: Secondary | ICD-10-CM | POA: Diagnosis not present

## 2021-07-14 DIAGNOSIS — M6281 Muscle weakness (generalized): Secondary | ICD-10-CM | POA: Diagnosis not present

## 2021-07-14 DIAGNOSIS — R1312 Dysphagia, oropharyngeal phase: Secondary | ICD-10-CM | POA: Diagnosis not present

## 2021-07-15 DIAGNOSIS — R1312 Dysphagia, oropharyngeal phase: Secondary | ICD-10-CM | POA: Diagnosis not present

## 2021-07-15 DIAGNOSIS — M6259 Muscle wasting and atrophy, not elsewhere classified, multiple sites: Secondary | ICD-10-CM | POA: Diagnosis not present

## 2021-07-15 DIAGNOSIS — R2681 Unsteadiness on feet: Secondary | ICD-10-CM | POA: Diagnosis not present

## 2021-07-15 DIAGNOSIS — M6258 Muscle wasting and atrophy, not elsewhere classified, other site: Secondary | ICD-10-CM | POA: Diagnosis not present

## 2021-07-15 DIAGNOSIS — M6281 Muscle weakness (generalized): Secondary | ICD-10-CM | POA: Diagnosis not present

## 2021-07-15 DIAGNOSIS — A419 Sepsis, unspecified organism: Secondary | ICD-10-CM | POA: Diagnosis not present

## 2021-07-19 DIAGNOSIS — R109 Unspecified abdominal pain: Secondary | ICD-10-CM | POA: Diagnosis not present

## 2021-07-19 DIAGNOSIS — R0602 Shortness of breath: Secondary | ICD-10-CM | POA: Diagnosis not present

## 2021-07-20 DIAGNOSIS — L89214 Pressure ulcer of right hip, stage 4: Secondary | ICD-10-CM | POA: Diagnosis not present

## 2021-07-20 DIAGNOSIS — I10 Essential (primary) hypertension: Secondary | ICD-10-CM | POA: Diagnosis not present

## 2021-07-20 DIAGNOSIS — L89154 Pressure ulcer of sacral region, stage 4: Secondary | ICD-10-CM | POA: Diagnosis not present

## 2021-07-21 DIAGNOSIS — E1165 Type 2 diabetes mellitus with hyperglycemia: Secondary | ICD-10-CM | POA: Diagnosis not present

## 2021-07-21 DIAGNOSIS — Z931 Gastrostomy status: Secondary | ICD-10-CM | POA: Diagnosis not present

## 2021-07-21 DIAGNOSIS — Z933 Colostomy status: Secondary | ICD-10-CM | POA: Diagnosis not present

## 2021-07-21 DIAGNOSIS — H938X3 Other specified disorders of ear, bilateral: Secondary | ICD-10-CM | POA: Diagnosis not present

## 2021-07-21 DIAGNOSIS — E870 Hyperosmolality and hypernatremia: Secondary | ICD-10-CM | POA: Diagnosis not present

## 2021-07-24 IMAGING — RF DG ESOPHAGUS
6 series · 22 of 24 positions shown · non-contrast
Comparison: None.

CLINICAL DATA: Inpatient with paraplegia secondary to transverse
myelitis, poor dentition and failure to thrive with weight loss.

EXAM:
ESOPHOGRAM/BARIUM SWALLOW
TECHNIQUE: Single contrast examination was performed using  thin barium.
FLUOROSCOPY TIME:  Fluoroscopy Time:  2 minutes 42 seconds
Radiation Exposure Index (if provided by the fluoroscopic device):
23.6 mGy
Number of Acquired Spot Images: 0

[Series 1: cp_standard · 0.51mm/px · 4 of 111 frames shown (1 of 6)]
[frame 16/111]
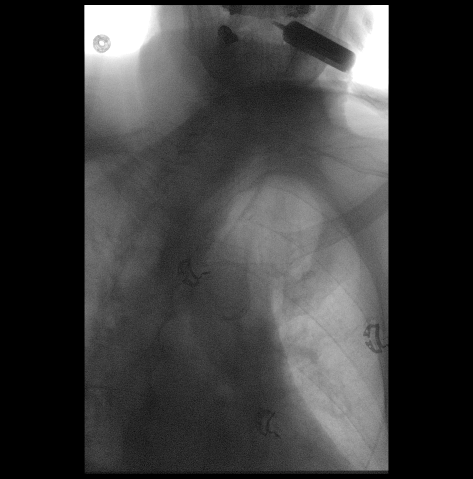
[frame 17/111]
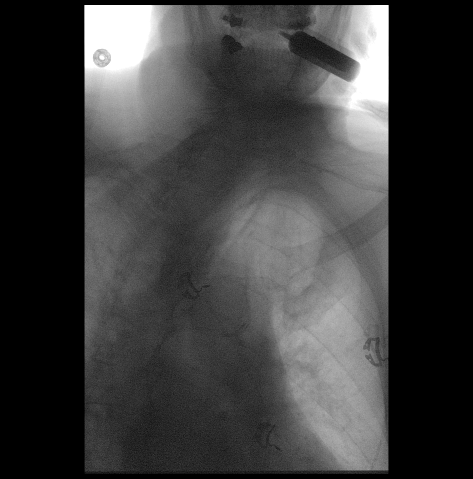
[frame 56/111]
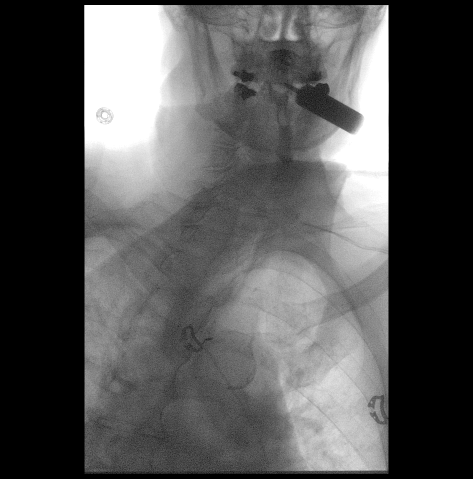
[frame 95/111]
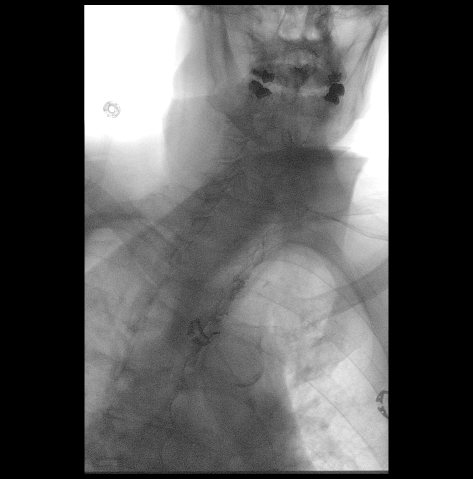

[Series 2: cp_standard · 0.51mm/px · 3 of 170 frames shown (2 of 6)]
[frame 26/170]
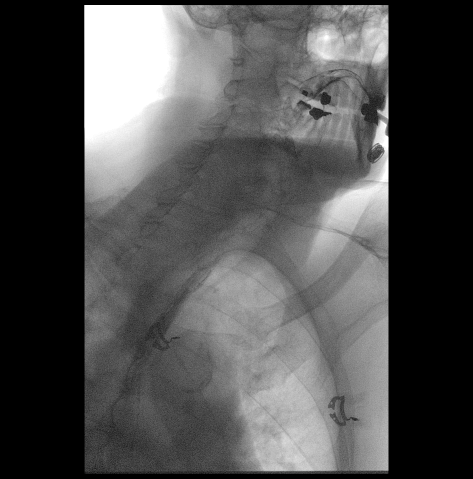
[frame 86/170]
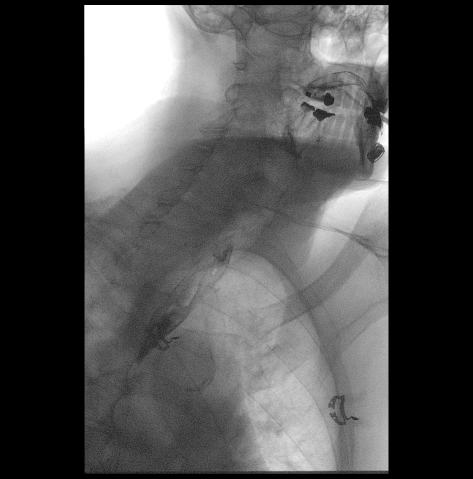
[frame 145/170]
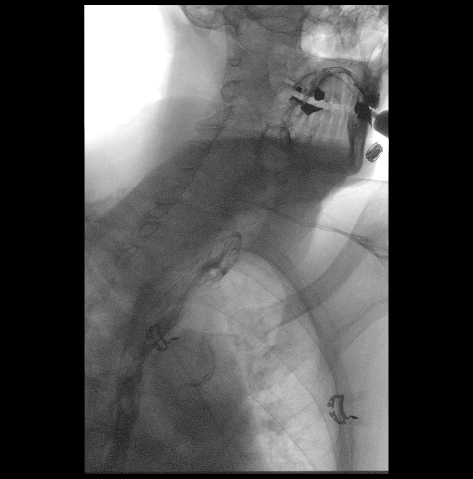

[Series 3: cp_standard · 0.51mm/px · 4 of 138 frames shown (3 of 6)]
[frame 21/138]
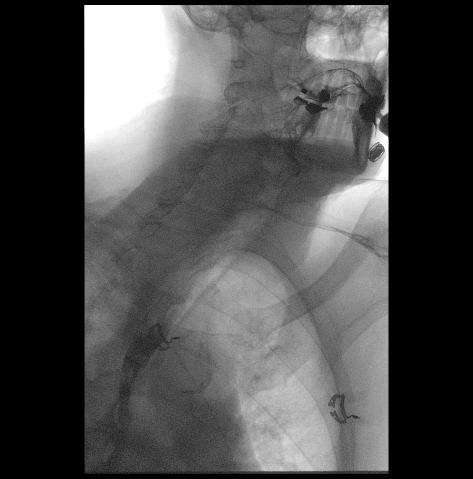
[frame 70/138]
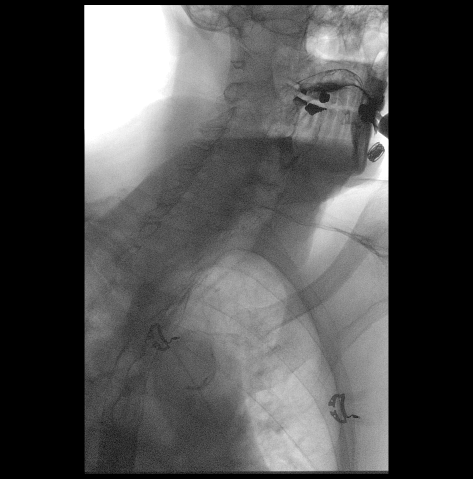
[frame 94/138]
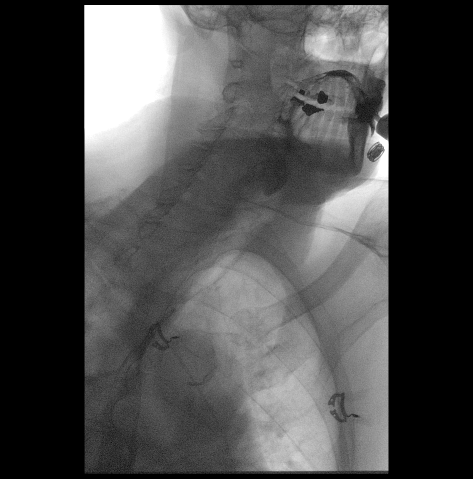
[frame 118/138]
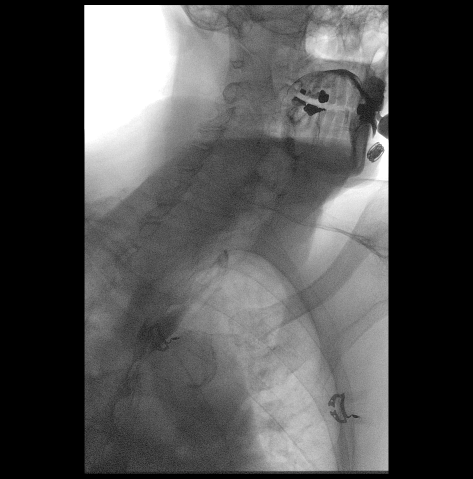

[Series 4: cp_standard · 0.51mm/px · 4 of 82 frames shown (4 of 6)]
[frame 13/82]
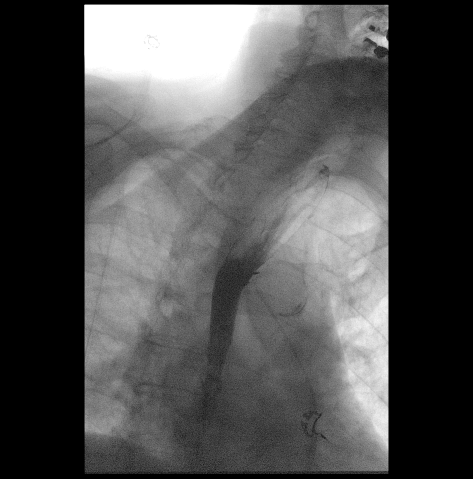
[frame 40/82]
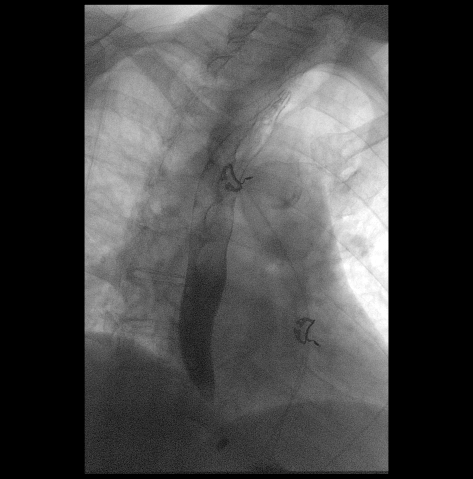
[frame 42/82]
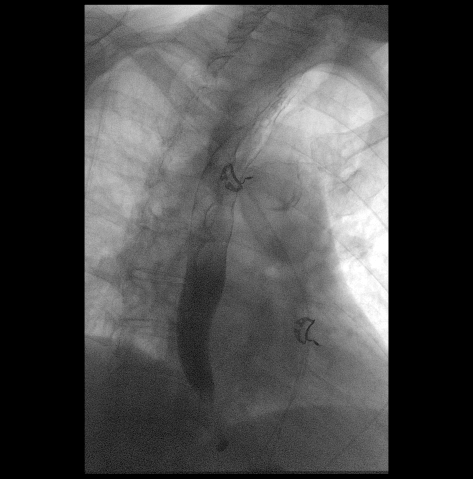
[frame 70/82]
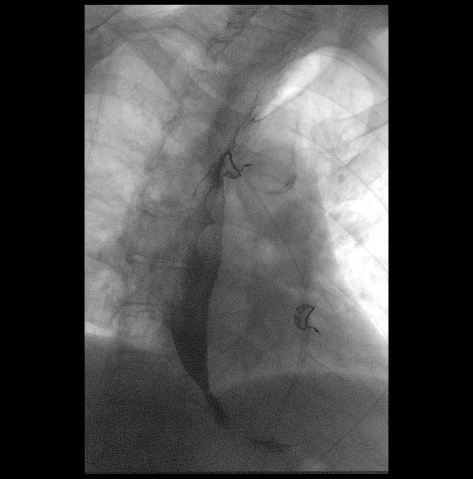

[Series 5: cp_standard · 0.51mm/px · 3 of 425 frames shown (5 of 6)]
[frame 64/425]
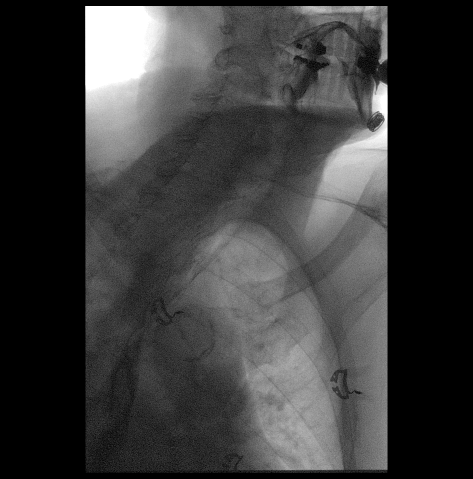
[frame 213/425]
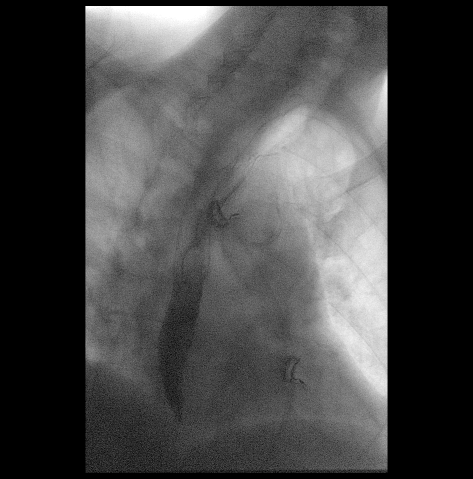
[frame 362/425]
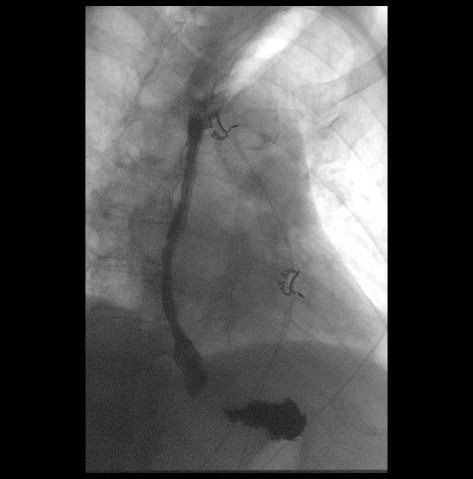

[Series 6: cp_standard · 0.53mm/px · 4 of 164 frames shown (6 of 6)]
[frame 25/164]
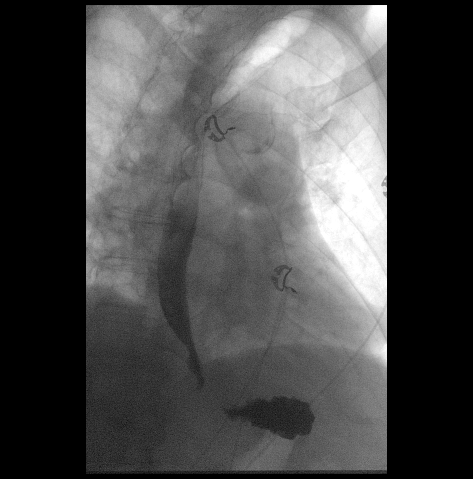
[frame 80/164]
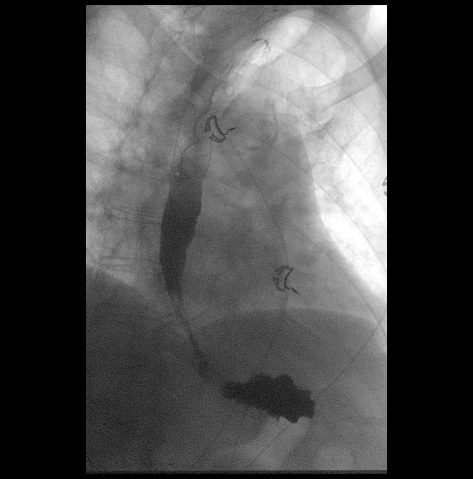
[frame 83/164]
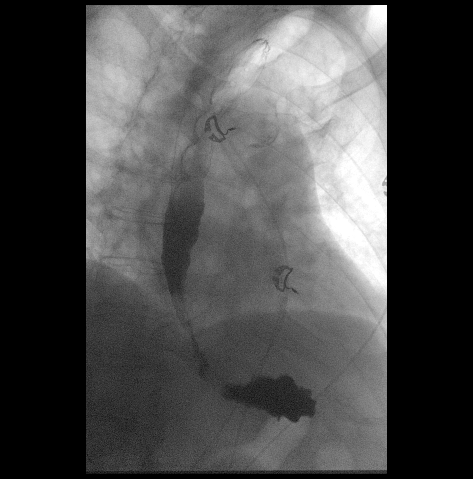
[frame 140/164]
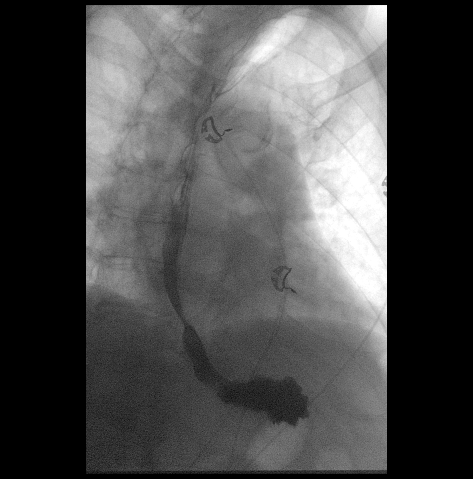

[22 of 24 positions shown; findings below may reference images not displayed]

FINDINGS: Examination is extremely limited due to patient's inability to drink
through a straw (patient appeared to have difficulty forming a seal
around the straw). Examination performed in the left lateral
decubitus position with mild head elevation. Thin barium was
administered into the patient's mouth via syringe. Pharyngeal phase
of swallowing is grossly normal with no evidence of laryngeal
penetration or tracheobronchial aspiration. Esophageal motility is
grossly normal. Esophageal distensibility appears normal with no
evidence of esophageal mass, stricture or ulcer. No evidence of
hiatal hernia. Unable to assess for gastroesophageal reflux given
the above limitations. Pill not administered due to patient's
limited oral phase of swallowing and inability to sit upright.
IMPRESSION: 1. Very limited examination due to limited oral phase of swallowing,
see comments and consider speech/swallow therapy consultation.
2. Unremarkable esophagus with no gross evidence of esophageal
dysmotility or stricture.

## 2021-07-27 DIAGNOSIS — L89214 Pressure ulcer of right hip, stage 4: Secondary | ICD-10-CM | POA: Diagnosis not present

## 2021-07-27 DIAGNOSIS — L89154 Pressure ulcer of sacral region, stage 4: Secondary | ICD-10-CM | POA: Diagnosis not present

## 2021-08-02 ENCOUNTER — Telehealth: Payer: Self-pay | Admitting: *Deleted

## 2021-08-02 NOTE — Telephone Encounter (Signed)
Per Dr. Felecia Shelling, intrafusion needing pt to have appt prior to infusion next month. Insurance requirement. Last seen 08/20/20. [place] at [phone]. Spoke with Opal Sidles who transferred me to scheduler, [place] Dec. Scheduled mychart VV for 08/05/21 at 1:30p w/ Dr. Felecia Shelling. She will let pt know. She will call back if anything further is needed prior to appt.

## 2021-08-03 DIAGNOSIS — I10 Essential (primary) hypertension: Secondary | ICD-10-CM | POA: Diagnosis not present

## 2021-08-03 DIAGNOSIS — Z433 Encounter for attention to colostomy: Secondary | ICD-10-CM | POA: Diagnosis not present

## 2021-08-03 DIAGNOSIS — K219 Gastro-esophageal reflux disease without esophagitis: Secondary | ICD-10-CM | POA: Diagnosis not present

## 2021-08-03 DIAGNOSIS — E785 Hyperlipidemia, unspecified: Secondary | ICD-10-CM | POA: Diagnosis not present

## 2021-08-03 DIAGNOSIS — D509 Iron deficiency anemia, unspecified: Secondary | ICD-10-CM | POA: Diagnosis not present

## 2021-08-03 DIAGNOSIS — R6883 Chills (without fever): Secondary | ICD-10-CM | POA: Diagnosis not present

## 2021-08-03 DIAGNOSIS — R110 Nausea: Secondary | ICD-10-CM | POA: Diagnosis not present

## 2021-08-03 DIAGNOSIS — E1159 Type 2 diabetes mellitus with other circulatory complications: Secondary | ICD-10-CM | POA: Diagnosis not present

## 2021-08-03 DIAGNOSIS — E870 Hyperosmolality and hypernatremia: Secondary | ICD-10-CM | POA: Diagnosis not present

## 2021-08-04 DIAGNOSIS — I10 Essential (primary) hypertension: Secondary | ICD-10-CM | POA: Diagnosis not present

## 2021-08-05 ENCOUNTER — Inpatient Hospital Stay (HOSPITAL_COMMUNITY)
Admission: EM | Admit: 2021-08-05 | Discharge: 2021-08-10 | DRG: 698 | Disposition: A | Discharge status: Skilled Nursing Facility | Payer: Medicare Other | Source: Skilled Nursing Facility | Attending: Internal Medicine | Admitting: Internal Medicine

## 2021-08-05 ENCOUNTER — Emergency Department (HOSPITAL_COMMUNITY): Payer: Medicare Other

## 2021-08-05 ENCOUNTER — Telehealth: Payer: Self-pay | Admitting: Neurology

## 2021-08-05 ENCOUNTER — Ambulatory Visit (INDEPENDENT_AMBULATORY_CARE_PROVIDER_SITE_OTHER): Payer: Medicare Other | Admitting: Neurology

## 2021-08-05 ENCOUNTER — Encounter: Payer: Self-pay | Admitting: Neurology

## 2021-08-05 DIAGNOSIS — Y738 Miscellaneous gastroenterology and urology devices associated with adverse incidents, not elsewhere classified: Secondary | ICD-10-CM | POA: Diagnosis present

## 2021-08-05 DIAGNOSIS — Z881 Allergy status to other antibiotic agents status: Secondary | ICD-10-CM

## 2021-08-05 DIAGNOSIS — E1165 Type 2 diabetes mellitus with hyperglycemia: Secondary | ICD-10-CM

## 2021-08-05 DIAGNOSIS — D649 Anemia, unspecified: Secondary | ICD-10-CM | POA: Diagnosis present

## 2021-08-05 DIAGNOSIS — Z800 Family history of malignant neoplasm of digestive organs: Secondary | ICD-10-CM

## 2021-08-05 DIAGNOSIS — Z79899 Other long term (current) drug therapy: Secondary | ICD-10-CM | POA: Diagnosis not present

## 2021-08-05 DIAGNOSIS — G8250 Quadriplegia, unspecified: Secondary | ICD-10-CM | POA: Diagnosis not present

## 2021-08-05 DIAGNOSIS — Z7189 Other specified counseling: Secondary | ICD-10-CM | POA: Diagnosis not present

## 2021-08-05 DIAGNOSIS — T83518A Infection and inflammatory reaction due to other urinary catheter, initial encounter: Secondary | ICD-10-CM | POA: Diagnosis not present

## 2021-08-05 DIAGNOSIS — I96 Gangrene, not elsewhere classified: Secondary | ICD-10-CM | POA: Diagnosis present

## 2021-08-05 DIAGNOSIS — Z23 Encounter for immunization: Secondary | ICD-10-CM

## 2021-08-05 DIAGNOSIS — G4733 Obstructive sleep apnea (adult) (pediatric): Secondary | ICD-10-CM | POA: Diagnosis present

## 2021-08-05 DIAGNOSIS — Z955 Presence of coronary angioplasty implant and graft: Secondary | ICD-10-CM

## 2021-08-05 DIAGNOSIS — Z933 Colostomy status: Secondary | ICD-10-CM

## 2021-08-05 DIAGNOSIS — T83511A Infection and inflammatory reaction due to indwelling urethral catheter, initial encounter: Secondary | ICD-10-CM | POA: Diagnosis not present

## 2021-08-05 DIAGNOSIS — R519 Headache, unspecified: Secondary | ICD-10-CM | POA: Diagnosis not present

## 2021-08-05 DIAGNOSIS — Z8249 Family history of ischemic heart disease and other diseases of the circulatory system: Secondary | ICD-10-CM

## 2021-08-05 DIAGNOSIS — K592 Neurogenic bowel, not elsewhere classified: Secondary | ICD-10-CM

## 2021-08-05 DIAGNOSIS — Z20822 Contact with and (suspected) exposure to covid-19: Secondary | ICD-10-CM | POA: Diagnosis present

## 2021-08-05 DIAGNOSIS — Z801 Family history of malignant neoplasm of trachea, bronchus and lung: Secondary | ICD-10-CM

## 2021-08-05 DIAGNOSIS — G360 Neuromyelitis optica [Devic]: Secondary | ICD-10-CM | POA: Diagnosis present

## 2021-08-05 DIAGNOSIS — Z794 Long term (current) use of insulin: Secondary | ICD-10-CM

## 2021-08-05 DIAGNOSIS — Z931 Gastrostomy status: Secondary | ICD-10-CM

## 2021-08-05 DIAGNOSIS — N390 Urinary tract infection, site not specified: Secondary | ICD-10-CM | POA: Diagnosis not present

## 2021-08-05 DIAGNOSIS — I2510 Atherosclerotic heart disease of native coronary artery without angina pectoris: Secondary | ICD-10-CM | POA: Diagnosis present

## 2021-08-05 DIAGNOSIS — L89154 Pressure ulcer of sacral region, stage 4: Secondary | ICD-10-CM | POA: Diagnosis present

## 2021-08-05 DIAGNOSIS — Z880 Allergy status to penicillin: Secondary | ICD-10-CM

## 2021-08-05 DIAGNOSIS — Z825 Family history of asthma and other chronic lower respiratory diseases: Secondary | ICD-10-CM

## 2021-08-05 DIAGNOSIS — G8220 Paraplegia, unspecified: Secondary | ICD-10-CM | POA: Diagnosis present

## 2021-08-05 DIAGNOSIS — N319 Neuromuscular dysfunction of bladder, unspecified: Secondary | ICD-10-CM | POA: Diagnosis not present

## 2021-08-05 DIAGNOSIS — Z87891 Personal history of nicotine dependence: Secondary | ICD-10-CM

## 2021-08-05 DIAGNOSIS — I700 Atherosclerosis of aorta: Secondary | ICD-10-CM | POA: Diagnosis not present

## 2021-08-05 DIAGNOSIS — B965 Pseudomonas (aeruginosa) (mallei) (pseudomallei) as the cause of diseases classified elsewhere: Secondary | ICD-10-CM | POA: Diagnosis present

## 2021-08-05 DIAGNOSIS — Y846 Urinary catheterization as the cause of abnormal reaction of the patient, or of later complication, without mention of misadventure at the time of the procedure: Secondary | ICD-10-CM | POA: Diagnosis present

## 2021-08-05 DIAGNOSIS — R1111 Vomiting without nausea: Secondary | ICD-10-CM | POA: Diagnosis not present

## 2021-08-05 DIAGNOSIS — R110 Nausea: Secondary | ICD-10-CM | POA: Diagnosis not present

## 2021-08-05 DIAGNOSIS — I10 Essential (primary) hypertension: Secondary | ICD-10-CM | POA: Diagnosis not present

## 2021-08-05 DIAGNOSIS — E119 Type 2 diabetes mellitus without complications: Secondary | ICD-10-CM | POA: Diagnosis present

## 2021-08-05 DIAGNOSIS — E876 Hypokalemia: Secondary | ICD-10-CM | POA: Diagnosis present

## 2021-08-05 DIAGNOSIS — L89626 Pressure-induced deep tissue damage of left heel: Secondary | ICD-10-CM | POA: Diagnosis present

## 2021-08-05 DIAGNOSIS — T83021A Displacement of indwelling urethral catheter, initial encounter: Secondary | ICD-10-CM | POA: Diagnosis present

## 2021-08-05 DIAGNOSIS — R109 Unspecified abdominal pain: Secondary | ICD-10-CM | POA: Diagnosis not present

## 2021-08-05 DIAGNOSIS — Z978 Presence of other specified devices: Secondary | ICD-10-CM | POA: Diagnosis not present

## 2021-08-05 DIAGNOSIS — L98423 Non-pressure chronic ulcer of back with necrosis of muscle: Secondary | ICD-10-CM | POA: Diagnosis present

## 2021-08-05 DIAGNOSIS — Z7982 Long term (current) use of aspirin: Secondary | ICD-10-CM

## 2021-08-05 DIAGNOSIS — R630 Anorexia: Secondary | ICD-10-CM | POA: Diagnosis present

## 2021-08-05 DIAGNOSIS — R0902 Hypoxemia: Secondary | ICD-10-CM | POA: Diagnosis not present

## 2021-08-05 LAB — CBG MONITORING, ED: Glucose-Capillary: 183 mg/dL — ABNORMAL HIGH (ref 70–99)

## 2021-08-05 MED ORDER — SODIUM CHLORIDE 0.9 % IV SOLN
2.0000 g | Freq: Once | INTRAVENOUS | Status: AC
  Administered 2021-08-06: 2 g via INTRAVENOUS
  Filled 2021-08-05: qty 2

## 2021-08-05 MED ORDER — LACTATED RINGERS IV SOLN: INTRAVENOUS | Status: DC

## 2021-08-05 MED ORDER — LACTATED RINGERS IV BOLUS (SEPSIS)
1000.0000 mL | Freq: Once | INTRAVENOUS | Status: AC
  Administered 2021-08-06: 1000 mL via INTRAVENOUS

## 2021-08-05 NOTE — Sepsis Progress Note (Signed)
Following per sepsis protocol   

## 2021-08-05 NOTE — Progress Notes (Signed)
Virtual Visit via Telephone Note  I connected with Brent [name]. on 08/05/21 at  1:30 PM EST by telephone and verified that I am speaking with the correct person using two identifiers.  Location: Patient: In skilled nursing facility Provider: Office   I discussed the limitations, risks, security and privacy concerns of performing an evaluation and management service by telephone and the availability of in person appointments. I also discussed with the patient that there may be a patient responsible charge related to this service. The patient expressed understanding and agreed to proceed.   History of Present Illness: For the neuromyelitis optica, he was treated with Rituxan in June 2022 and is due for his next course of treatment in December.  We had a discussion about the infection risk with Rituxan.  It could increase the risk of infection.  He does have a slowly healing sacral decubitus but reports it is not currently infected.  He gets urinary tract infections periodically.  He is currently living in a skilled nursing facility.  He feels his neurologic status has been stable.  He has mild to moderate weakness in the hands and the left arm and no strength in the legs.  He does not note much difficulty with numbness.  He has a neurogenic bladder.  He has no visual problems.  He reports that he just had his first UTI.  For the bowel, he has has an ileostomy.  He feels vision is stable   He had a sacral wound that is slowing healing slowly.  He has a UTI  currently (is catheterized)  requiring antibiotics and frequent visits to wound care.  He feels he is doing much better.  I reviewed some notes from wound management and it does appear like he is doing better with main lesion.  He has had some other smaller stones but they did not develop into significant ulcers.    NMOSD HIstory: On March 08, 2020, while sitting at church, he started to have intense pain in his chest.  He was concerned  about an MI and drove to his nearby house.   He continued to feel poorly and his wife called an ambulance.   He went to [facility] ED.and EKG was fine.  While in the ED, he started to experience numbness first in his right leg then left leg and then in the hands.     MRIs of the spine were initially felt to be non-contributary.   Initially a spinal stroke was suspected.   For one day, he had SOB and went to the ICU.   Anti-NMO Abs returned positive.   He received 5 day course of IV Solumedrol and 5 days IVIg.    Rituxan was infused 03/24/2020 and again 2 weeks later. He went to the Rehab floor and started to have some improvement.  Upon discharge, he went to skilled nursing facility.  Rituxan was continued    Assessment and Plan: 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Brent [name]) - Plan: IgG, IgA, IgM, CD19 and CD20, Flow Cytometry, CBC with Differential/Platelet, Neuromyelitis optica autoab, IgG  High risk medication use - Plan: IgG, IgA, IgM, CD19 and CD20, Flow Cytometry, CBC with Differential/Platelet, Neuromyelitis optica autoab, IgG  Neurogenic bowel  Paraplegia (HCC)  Neurogenic bladder   Continue with rituximab 1000 mg x 2 separated by 2 weeks every 6 months Check IgG/IgM, CD19/CD20 and CBC with differential He will continue with wound care and other medical services Return in 6 months or sooner if there are  new or worsening neurologic symptoms.  Follow Up Instructions: I discussed the assessment and treatment plan with the patient. The patient was provided an opportunity to ask questions and all were answered. The patient agreed with the plan and demonstrated an understanding of the instructions.   The patient was advised to call back or seek an in-person evaluation if the symptoms worsen or if the condition fails to improve as anticipated.  I provided 22 minutes of non-face-to-face time during this encounter.   Britt Bottom, MD

## 2021-08-05 NOTE — ED Provider Notes (Signed)
[facility] EMERGENCY DEPARTMENT Provider Note   CSN: [id number] Arrival date & time: 08/05/21  2324     History No chief complaint on file.   Brent [name]. is a 75 y.o. male.  Patient with history of diabetes, hypertension, Devic's disease, quadriplegia, indwelling Foley catheter, ileostomy, PEG tube presenting with a 3-day history of nausea, unable to eat or take his medications, abdominal pain and headache.  Does not think he had a fever.  Complains of pain diffusely in his abdomen with multiple episodes of nausea and dry heaving but no frank vomiting.  He has a G-tube which is used for 1 medication but he still eats by mouth bolus has been unable to during the past 3 or 4 days.  Ileostomy output has been the same.  States urine has been cloudy and has catheter which was changed 3 days ago.  Does not think he has a fever.  Has chronic wounds to his sacrum which are clean by his report.  Also complains of gradual onset posterior headache X coming and going over the past 3 days.  Denies any new weakness, numbness or tingling.  Denies any acute visual changes. 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  It is not acutely changed today. His facility sent him to the ED today with elevated blood pressure and headache and abdominal pain.   He was seen by his neurologist today with a discussion regarding infusion of Rituxan for his neuromyelitis optica  The history is provided by the patient and the EMS personnel.      Past Medical History:  Diagnosis Date   Allergy    CAD (coronary artery disease)    a. angioplasty of his RCA in 1990. b. bare metal stent placed in the RCA in 2000 followed by rotational atherectomy shortly after for stent restenosis. c. last cath was in 2012 showing stable moderate diffuse CAD. (70% mid LAD, 80% diagonal, 70% Ramus, 40% mid to distal RCA stent restenosis). d. Low risk nuc in 2015.   Diabetes mellitus     Diverticulosis    Elevated CK    Erectile dysfunction    Hemorrhoids    HTN (hypertension)    Hyperlipidemia    Hypertriglyceridemia    Malignant melanoma of left side of neck ([place]) 10/25/2018   Myocardial infarction ([place])    Obesity    OSA (obstructive sleep apnea)    Persistent disorder of initiating or maintaining sleep    Personal history of colonic polyps 02/05/2003    Patient Active Problem List   Diagnosis Date Noted   Osteomyelitis of pelvic region, acute, right ([place]) 03/24/2021   Right ischial pressure sore 02/10/2021   Encounter for monitoring immunomodulating therapy 08/20/2020   High risk medication use 08/20/2020   Malnutrition of moderate degree 06/04/2020   Palliative care by specialist    Goals of care, counseling/discussion    FTT (failure to thrive) in adult 06/01/2020   Abdominal pain    Sacral ulcer, with necrosis of muscle ([place]) 04/09/2020   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[place])    Transverse myelitis (H[facility])    Labile blood pressure    Labile blood glucose    Neurogenic bladder    Neurogenic bowel    Acute blood loss anemia    Transaminitis    Controlled type 2 diabetes mellitus with hyperglycemia, with long-term current use of insulin ([place])    Dyslipidemia    HCAP (healthcare-associated pneumonia)    Paraplegia ([place])  Coronary artery disease involving native coronary artery of native heart without angina pectoris    Steroid-induced hyperglycemia    Diabetic peripheral neuropathy (H[facility])    Hyponatremia    AKI (acute kidney injury) ([place])    Weakness 03/09/2020   Chest pain 03/08/2020   Right leg numbness    Malignant melanoma of left side of neck ([place]) 10/25/2018   Facial neuritis 10/25/2018   Myofascial pain syndrome 10/25/2018   Occipital neuralgia of left side 10/25/2018   CAP (community acquired pneumonia) 09/08/2017   Male hypogonadism 06/20/2016   Renal lesion 06/20/2016   Insomnia 08/07/2015   Enlarged prostate with lower urinary tract symptoms  (LUTS) 11/07/2014   Balanitis 07/30/2012   Bladder neck obstruction 07/30/2012   Prostate nodule 06/18/2012   Recurrent nephrolithiasis 06/18/2012   Benign neoplasm of colon 08/29/2011   DEPRESSION, SITUATIONAL, ACUTE 01/23/2010   Essential hypertension 05/30/2009   Coronary atherosclerosis 05/30/2009   Obstructive sleep apnea 11/28/2008   OBESITY 09/23/2008   ERECTILE DYSFUNCTION 11/26/2007   Well controlled type 2 diabetes mellitus with peripheral circulatory disorder ([place]) 05/09/2007   Hyperlipidemia 05/09/2007   MYOCARDIAL INFARCTION, HX OF 05/09/2007   DIVERTICULOSIS, COLON 05/09/2007    Past Surgical History:  Procedure Laterality Date   CHOLECYSTECTOMY     CORONARY ANGIOPLASTY     CORONARY STENT PLACEMENT     stenting of the right coronary artery with followup rotational  atherectomy. (3 stents placed)   FINGER SURGERY     right   FOOT SURGERY     right   INGUINAL HERNIA REPAIR     right   IRRIGATION AND DEBRIDEMENT ABSCESS N/A 06/04/2020   Procedure: IRRIGATION AND DEBRIDEMENT SACRAL WOUND;  Surgeon: Jesusita Oka, MD;  Location: [facility];  Service: General;  Laterality: N/A;   LAPAROSCOPY N/A 06/04/2020   Procedure: LAPAROSCOPY ASSISTED COLOSTOMY, LAPAROSCOPY GASTROSTOMY;  Surgeon: Jesusita Oka, MD;  Location: [facility];  Service: General;  Laterality: N/A;   LYSIS OF ADHESION N/A 06/04/2020   Procedure: LYSIS OF ADHESION;  Surgeon: Jesusita Oka, MD;  Location: [facility];  Service: General;  Laterality: N/A;   melanoma removal     neck   ORTHOPEDIC SURGERY     foot right   rotator cuff surg     Bil       Family History  Problem Relation Age of Onset   Liver cancer Father    Lung cancer Father    Heart disease Father    Heart disease Sister    Lung cancer Mother    COPD Mother    Obesity Mother    Colon cancer Neg Hx    Stomach cancer Neg Hx    Pancreatic cancer Neg Hx     Social History   Tobacco Use   Smoking status: Former    Packs/day: 1.50     Years: 30.00    Pack years: 45.00    Types: Cigarettes    Quit date: 09/19/1980    Years since quitting: 40.9   Smokeless tobacco: Never  Vaping Use   Vaping Use: Never used  Substance Use Topics   Alcohol use: Not Currently    Alcohol/week: 7.0 - 14.0 standard drinks    Types: 7 - 14 Shots of liquor per week   Drug use: No    Home Medications Prior to Admission medications   Medication Sig Start Date End Date Taking? Authorizing Provider  acetaminophen (TYLENOL) 325 MG tablet Take 2 tablets (650 mg  total) by mouth every 6 (six) hours as needed for headache. Patient not taking: Reported on 03/24/2021 04/24/20   Bary Leriche, PA-C  albuterol (PROVENTIL) (2.5 MG/3ML) 0.083% nebulizer solution Take 3 mLs (2.5 mg total) by nebulization once as needed for wheezing or shortness of breath (or anaphylaxis due to Rituxan infusion). Patient taking differently: Take 2.5 mg by nebulization every 6 (six) hours as needed for wheezing or shortness of breath (or anaphylaxis due to Rituxan infusion). 04/24/20   Love, Ivan Anchors, PA-C  Amino Acids-Protein Hydrolys (FEEDING SUPPLEMENT, PRO-STAT SUGAR FREE 64,) LIQD Take 30 mLs by mouth in the morning and at bedtime.    [provider]  antiseptic oral rinse (BIOTENE) LIQD 10 mLs by Mouth Rinse route in the morning, at noon, and at bedtime. Swish & Spit    [provider]  Ascorbic Acid (VITA-C PO) Take 500 mg by mouth daily. Patient not taking: Reported on 03/24/2021    [provider]  aspirin EC 81 MG tablet Take 81 mg by mouth daily. Swallow whole.    [provider]  atorvastatin (LIPITOR) 20 MG tablet Take 1 tablet (20 mg total) by mouth daily. Patient taking differently: Take 20 mg by mouth at bedtime. 03/26/20   Shelly Coss, MD  Baclofen 5 MG TABS Take 1 tablet by mouth at bedtime. 02/25/21   [provider]  collagenase (SANTYL) ointment Apply topically daily. Patient not taking: No sig reported 11/23/20   Dana Allan I, MD  dronabinol (MARINOL) 2.5 MG capsule Take 2.5 mg by mouth in the morning and at bedtime. 01/28/21   [provider]  ferrous gluconate (FERGON) 324 MG tablet Take 324 mg by mouth 2 (two) times daily with a meal.    [provider]  fluticasone (FLONASE) 50 MCG/ACT nasal spray Place 2 sprays into both nostrils 2 (two) times daily. Patient taking differently: Place 1 spray into both nostrils daily. 04/24/20   Love, Ivan Anchors, PA-C  gabapentin (NEURONTIN) 300 MG capsule Place 1 capsule (300 mg total) into feeding tube at bedtime. Patient not taking: No sig reported 06/10/20   Rai, Vernelle Emerald, MD  Icosapent Ethyl (VASCEPA) 1 g CAPS Take 2 capsules (2 g total) by mouth 2 (two) times daily. Patient not taking: No sig reported 07/19/19   Melina Copa N, PA-C  insulin aspart (NOVOLOG) 100 UNIT/ML injection Inject 0-15 Units into the skin every 4 (four) hours. Sliding scale  CBG 70 - 120: 0 units: CBG 121 - 150: 2 units; CBG 151 - 200: 3 units; CBG 201 - 250: 5 units; CBG 251 - 300: 8 units;CBG 301 - 350: 11 units; CBG 351 - 400: 15 units; CBG > 400 : 15 units and notify MD Patient taking differently: Inject 0-15 Units into the skin See admin instructions. Sliding scale  If CBG>70 Call NP/PA, CBG 70 - 120: 0 units: CBG 121 - 150: 2 units; CBG 151 - 200: 3 units; CBG 201 - 250: 5 units; CBG 251 - 300: 8 units; CBG 301 - 350: 11 units; CBG 351 - 400: 15units and  CBG >400: 15 Units, CBG>400 Call MD 06/10/20   Rai, Ripudeep K, MD  insulin glargine (LANTUS) 100 unit/mL SOPN Inject 15 Units into the skin at bedtime. Patient taking differently: Inject 23 Units into the skin 2 (two) times daily. 06/10/20   Rai, Ripudeep K, MD  Lidocaine 4 % PTCH Apply 1 patch topically 2 (two) times daily. Apply to right hip  in the morning remove at night    [provider]  lisinopril (ZESTRIL) 20 MG tablet Take 1 tablet by mouth daily. 01/01/21   [provider]  magnesium hydroxide (MILK  OF MAGNESIA) 400 MG/5ML suspension Take 30 mLs by mouth daily as needed for mild constipation.    [provider]  Menthol (ICY HOT ADVANCED RELIEF) 7.5 % PTCH Apply 1 patch topically daily.    [provider]  Menthol, Topical Analgesic, (BIOFREEZE) 4 % GEL Apply 1 application topically in the morning, at noon, and at bedtime.     [provider]  metFORMIN (GLUCOPHAGE) 500 MG tablet Take 250 mg by mouth 2 (two) times daily with a meal.    [provider]  methenamine (HIPREX) 1 g tablet Take 1 g by mouth 2 (two) times daily. 02/22/21   [provider]  Nutritional Supplements (FEEDING SUPPLEMENT, GLUCERNA 1.5 CAL,) LIQD Place 75 mLs into feeding tube continuous. 75 ml/hr    [provider]  pantoprazole (PROTONIX) 40 MG tablet Take 40 mg by mouth 2 (two) times daily. 10/19/20   [provider]  polyvinyl alcohol (LIQUIFILM TEARS) 1.4 % ophthalmic solution Place 2 drops into both eyes 2 (two) times daily as needed for dry eyes.    [provider]  PSYLLIUM PO Take 0.4 g by mouth in the morning and at bedtime.    [provider]  senna-docusate (SENOKOT-S) 8.6-50 MG tablet Take 2 tablets by mouth daily.    [provider]  sertraline (ZOLOFT) 50 MG tablet Take 50 mg by mouth at bedtime. 11/10/20   [provider]  tamsulosin (FLOMAX) 0.4 MG CAPS capsule Take 1 capsule (0.4 mg total) by mouth daily after supper. Patient not taking: No sig reported 06/10/20   Rai, Vernelle Emerald, MD  traMADol (ULTRAM) 50 MG tablet Take 50 mg by mouth 2 (two) times daily as needed for moderate pain.    [provider]  traZODone (DESYREL) 50 MG tablet Take 50 mg by mouth at bedtime.    [provider]  vitamin B-12 (CYANOCOBALAMIN) 500 MCG tablet Take 500 mcg by mouth daily.    [provider]  zinc sulfate 220 (50 Zn) MG capsule Place 1 capsule (220 mg total) into feeding tube daily. Patient not taking:  No sig reported 06/10/20   Rai, Vernelle Emerald, MD    Allergies    Levofloxacin and Penicillins  Review of Systems   Review of Systems  Constitutional:  Positive for activity change, appetite change and fatigue. Negative for fever.  HENT:  Negative for congestion and rhinorrhea.   Respiratory:  Negative for cough, chest tightness and shortness of breath.   Gastrointestinal:  Positive for abdominal pain and nausea. Negative for constipation, diarrhea and vomiting.  Genitourinary:  Negative for dysuria and hematuria.  Skin:  Positive for wound.  Neurological:  Positive for weakness and headaches. Negative for dizziness.   all other systems are negative except as noted in the HPI and PMH.   Physical Exam Updated Vital Signs BP (!) 154/67   Pulse 61   Temp 99.4 F (37.4 C) (Rectal)   Resp 14   Ht 6' (1.829 m)   Wt 90.7 kg   SpO2 98%   BMI 27.12 kg/m   Physical Exam Vitals and nursing note reviewed.  Constitutional:      General: He is not in acute distress.    Appearance: He is well-developed.  HENT:  Head: Normocephalic and atraumatic.     Mouth/Throat:     Pharynx: No oropharyngeal exudate.  Eyes:     Conjunctiva/sclera: Conjunctivae normal.     Pupils: Pupils are equal, round, and reactive to light.  Neck:     Comments: No meningismus. Cardiovascular:     Rate and Rhythm: Normal rate and regular rhythm.     Heart sounds: Normal heart sounds. No murmur heard. Pulmonary:     Effort: Pulmonary effort is normal. No respiratory distress.     Breath sounds: Normal breath sounds.  Abdominal:     Palpations: Abdomen is soft.     Tenderness: There is abdominal tenderness. There is no guarding or rebound.     Comments: G-tube in place, left lower quadrant ileostomy.  Abdomen is soft, diffusely tender, no guarding or rebound  Genitourinary:    Comments: Large sacral decubitus ulcer, clean appearing, second decubitus ulcer to right ischium also clean  appearing Musculoskeletal:        General: No tenderness. Normal range of motion.     Cervical back: Normal range of motion and neck supple.     Comments: Foley catheter in place draining cloudy urine  Skin:    General: Skin is warm.  Neurological:     Mental Status: He is alert.     Cranial Nerves: No cranial nerve deficit.     Motor: Weakness present. No abnormal muscle tone.     Coordination: Coordination abnormal.     Comments: CN 2-12 intact. Quadraplegia at baseline. Able to squeeze hands bilaterally. Unable to move legs at baseline.  Psychiatric:        Behavior: Behavior normal.    ED Results / Procedures / Treatments   Labs (all labs ordered are listed, but only abnormal results are displayed) Labs Reviewed  COMPREHENSIVE METABOLIC PANEL - Abnormal; Notable for the following components:      Result Value   Potassium 2.8 (*)    Glucose, Bld 187 (*)    BUN <5 (*)    Creatinine, Ser 0.50 (*)    Total Protein 6.1 (*)    Albumin 2.6 (*)    AST 14 (*)    All other components within normal limits  CBC WITH DIFFERENTIAL/PLATELET - Abnormal; Notable for the following components:   Hemoglobin 11.6 (*)    HCT 38.3 (*)    MCH 25.2 (*)    RDW 15.6 (*)    All other components within normal limits  URINALYSIS, ROUTINE W REFLEX MICROSCOPIC - Abnormal; Notable for the following components:   APPearance CLOUDY (*)    Specific Gravity, Urine 1.033 (*)    Glucose, UA 50 (*)    Hgb urine dipstick MODERATE (*)    Ketones, ur 5 (*)    Protein, ur 100 (*)    Nitrite POSITIVE (*)    Leukocytes,Ua LARGE (*)    RBC / HPF >50 (*)    WBC, UA >50 (*)    Bacteria, UA RARE (*)    All other components within normal limits  CBG MONITORING, ED - Abnormal; Notable for the following components:   Glucose-Capillary 183 (*)    All other components within normal limits  RESP PANEL BY RT-PCR (FLU A&B, COVID) ARPGX2  CULTURE, BLOOD (ROUTINE X 2)  CULTURE, BLOOD (ROUTINE X 2)  URINE CULTURE   LACTIC ACID, PLASMA  LACTIC ACID, PLASMA  PROTIME-INR  APTT  LIPASE, BLOOD  HEMOGLOBIN A1C  MAGNESIUM  TROPONIN I (HIGH SENSITIVITY)  TROPONIN  I (HIGH SENSITIVITY)    EKG EKG Interpretation  Date/Time:  Thursday August 05 2021 23:26:21 EST Ventricular Rate:  66 PR Interval:  184 QRS Duration: 100 QT Interval:  441 QTC Calculation: 463 R Axis:   7 Text Interpretation: Sinus rhythm Nonspecific repol abnormality, lateral leads No significant change was found Confirmed by Ezequiel Essex [phone]) on 08/05/2021 11:35:50 PM  Radiology CT Head Wo Contrast  Result Date: 08/06/2021 CLINICAL DATA:  Headache.  Concern for intracranial hemorrhage. EXAM: CT HEAD WITHOUT CONTRAST TECHNIQUE: Contiguous axial images were obtained from the base of the skull through the vertex without intravenous contrast. COMPARISON:  Head CT dated 03/10/2020. FINDINGS: Brain: Mild age-related atrophy and chronic microvascular ischemic changes. There is no acute intracranial hemorrhage. No mass effect or midline shift. Right posterior fossa probable arachnoid cyst similar to prior CT. Vascular: No hyperdense vessel or unexpected calcification. Skull: Normal. Negative for fracture or focal lesion. Sinuses/Orbits: No acute finding. Other: None IMPRESSION: 1. No acute intracranial pathology. 2. Mild age-related atrophy and chronic microvascular ischemic changes. Electronically Signed   By: Anner Crete M.D.   On: 08/06/2021 01:31   CT ABDOMEN PELVIS W CONTRAST  Result Date: 08/06/2021 CLINICAL DATA:  Abdominal pain EXAM: CT ABDOMEN AND PELVIS WITH CONTRAST TECHNIQUE: Multidetector CT imaging of the abdomen and pelvis was performed using the standard protocol following bolus administration of intravenous contrast. CONTRAST:  137mL OMNIPAQUE IOHEXOL 300 MG/ML  SOLN COMPARISON:  CT abdomen pelvis dated 12/21/2020. FINDINGS: Lower chest: Trace bilateral pleural effusions with associated partial compressive  atelectasis of the lower lobes. There is 3 vessel coronary vascular calcification. Partially visualized small pericardial effusion. No intra-abdominal free air or free fluid. Hepatobiliary: Fatty liver. No intrahepatic biliary dilatation. Cholecystectomy. Pancreas: Unremarkable. No pancreatic ductal dilatation or surrounding inflammatory changes. Spleen: Normal in size without focal abnormality. Adrenals/Urinary Tract: The adrenal glands unremarkable. Similar appearance of cyst in the upper pole of the left kidney with layering high density material. There is a 2 cm left renal medial interpolar cyst. There is no hydronephrosis on either side. There is symmetric enhancement and excretion of contrast by both kidneys. Abdominal the visualized ureters appear unremarkable. Diffuse thickened and hazy appearance of the bladder wall concerning for cystitis. Correlation with urinalysis recommended. A Foley catheter is noted with balloon in the region of the prostatic urethra. Recommend further advancing of the catheter by additional 7 cm. Stomach/Bowel: Percutaneous gastrostomy with balloon in the body of the stomach. There is severe sigmoid and scattered colonic diverticulosis without active inflammatory changes. There is a left lower quadrant loop colostomy. There is no bowel obstruction or active inflammation. The appendix is normal. Vascular/Lymphatic: Advanced aortoiliac atherosclerotic disease. The IVC is unremarkable. No portal venous gas. There is no adenopathy. Reproductive: The prostate is grossly unremarkable Other: Of diffuse subcutaneous edema. Musculoskeletal: Osteopenia with degenerative changes of the spine and hips. No acute osseous pathology. IMPRESSION: 1. Foley catheter with balloon in the region of the prostatic urethra. Recommend further advancing of the catheter by additional 7 cm. 2. Severe colonic diverticulosis. No bowel obstruction. Normal appendix. 3. Fatty liver. 4. Trace bilateral pleural  effusions with associated partial compressive atelectasis of the lower lobes. 5. Aortic Atherosclerosis (ICD10-I70.0). Electronically Signed   By: Anner Crete M.D.   On: 08/06/2021 01:36   DG Chest Port 1 View  Result Date: 08/05/2021 CLINICAL DATA:  Headache and nausea. EXAM: PORTABLE CHEST 1 VIEW COMPARISON:  December 21, 2020 FINDINGS: The heart size and mediastinal contours are  within normal limits. There is moderate severity calcification of the aortic arch. Both lungs are clear. A 3.7 cm x 3.2 cm well-defined opacity is seen overlying the body of the stomach. The visualized skeletal structures are unremarkable. IMPRESSION: No active disease. Electronically Signed   By: Virgina Norfolk M.D.   On: 08/05/2021 23:45    Procedures Procedures   Medications Ordered in ED Medications  lactated ringers infusion (has no administration in time range)  lactated ringers bolus 1,000 mL (has no administration in time range)  ceFEPIme (MAXIPIME) 2 g in sodium chloride 0.9 % 100 mL IVPB (has no administration in time range)    ED Course  I have reviewed the triage vital signs and the nursing notes.  Pertinent labs & imaging results that were available during my care of the patient were reviewed by me and considered in my medical decision making (see chart for details).    MDM Rules/Calculators/A&P                          Patient with a 3-day history of headache, nausea, abdominal pain, not able to eat or drink due to dry heaving  Temperature 99.4 on arrival.  Concern for possible sepsis due to UTI. Is given fluids and broad-spectrum antibiotics after cultures are obtained.  Lactate is normal.  CT scan will be obtained given his abdominal pain and inability to eat.  Also CT head given his severe headache he has had for the past several days.  Labs show hypokalemia but stable kidney function. CT head is unchanged.  CT abdomen pelvis shows diverticulosis and malposition of urethral  catheter.  Discussed with nursing staff to advance catheter or change it.  UA Concerning for infection.  Patient not febrile and lactic acid is normal.  He does not appear to be septic.  Headache is improved with IV fluids, Toradol, Reglan and Benadryl.  Low suspicion for subarachnoid hemorrhage, meningitis, Or temporal arteritis. Abdominal pain likely secondary to UTI.  Given patient's nausea, inability to tolerate p.o., complicated UTI will plan admission to the hospital for IV antibiotics and hydration. Discussed with Dr. Myna Hidalgo  Final Clinical Impression(s) / ED Diagnoses Final diagnoses:  Complicated UTI (urinary tract infection)    Rx / DC Orders ED Discharge Orders     None        Fines Kimberlin, Annie Main, MD 08/06/21 [phone]

## 2021-08-05 NOTE — Telephone Encounter (Signed)
Called pt because he was not logged into mychart yet for VV at 130p w/ Dr. Felecia Shelling. He is having difficulty logging on. Got approval from MD to do telephone visit. Advised per MD that he will call him in 10-15min to do visit with him. He verbalized understanding and will keep phone close by.

## 2021-08-05 NOTE — ED Triage Notes (Signed)
Pt from facility witih complaint of headache and nausea, unable to eat. Also complain of abdominal pain. Arrived with stage 4 decub to left buttock, foley with cloudy urine and ileostomy

## 2021-08-06 ENCOUNTER — Emergency Department (HOSPITAL_COMMUNITY): Payer: Medicare Other

## 2021-08-06 ENCOUNTER — Encounter (HOSPITAL_COMMUNITY): Payer: Self-pay | Admitting: Family Medicine

## 2021-08-06 DIAGNOSIS — I959 Hypotension, unspecified: Secondary | ICD-10-CM | POA: Diagnosis not present

## 2021-08-06 DIAGNOSIS — R109 Unspecified abdominal pain: Secondary | ICD-10-CM | POA: Diagnosis not present

## 2021-08-06 DIAGNOSIS — L98423 Non-pressure chronic ulcer of back with necrosis of muscle: Secondary | ICD-10-CM | POA: Diagnosis not present

## 2021-08-06 DIAGNOSIS — Z20822 Contact with and (suspected) exposure to covid-19: Secondary | ICD-10-CM | POA: Diagnosis present

## 2021-08-06 DIAGNOSIS — R532 Functional quadriplegia: Secondary | ICD-10-CM | POA: Diagnosis not present

## 2021-08-06 DIAGNOSIS — E876 Hypokalemia: Secondary | ICD-10-CM

## 2021-08-06 DIAGNOSIS — I96 Gangrene, not elsewhere classified: Secondary | ICD-10-CM | POA: Diagnosis present

## 2021-08-06 DIAGNOSIS — G4733 Obstructive sleep apnea (adult) (pediatric): Secondary | ICD-10-CM

## 2021-08-06 DIAGNOSIS — D649 Anemia, unspecified: Secondary | ICD-10-CM | POA: Diagnosis present

## 2021-08-06 DIAGNOSIS — R41841 Cognitive communication deficit: Secondary | ICD-10-CM | POA: Diagnosis not present

## 2021-08-06 DIAGNOSIS — Z8249 Family history of ischemic heart disease and other diseases of the circulatory system: Secondary | ICD-10-CM | POA: Diagnosis not present

## 2021-08-06 DIAGNOSIS — Z825 Family history of asthma and other chronic lower respiratory diseases: Secondary | ICD-10-CM | POA: Diagnosis not present

## 2021-08-06 DIAGNOSIS — Z23 Encounter for immunization: Secondary | ICD-10-CM | POA: Diagnosis not present

## 2021-08-06 DIAGNOSIS — Z7409 Other reduced mobility: Secondary | ICD-10-CM | POA: Diagnosis not present

## 2021-08-06 DIAGNOSIS — Z794 Long term (current) use of insulin: Secondary | ICD-10-CM | POA: Diagnosis not present

## 2021-08-06 DIAGNOSIS — R278 Other lack of coordination: Secondary | ICD-10-CM | POA: Diagnosis not present

## 2021-08-06 DIAGNOSIS — L89154 Pressure ulcer of sacral region, stage 4: Secondary | ICD-10-CM | POA: Diagnosis present

## 2021-08-06 DIAGNOSIS — G8220 Paraplegia, unspecified: Secondary | ICD-10-CM | POA: Diagnosis not present

## 2021-08-06 DIAGNOSIS — Y846 Urinary catheterization as the cause of abnormal reaction of the patient, or of later complication, without mention of misadventure at the time of the procedure: Secondary | ICD-10-CM | POA: Diagnosis present

## 2021-08-06 DIAGNOSIS — T83021A Displacement of indwelling urethral catheter, initial encounter: Secondary | ICD-10-CM | POA: Diagnosis present

## 2021-08-06 DIAGNOSIS — R6520 Severe sepsis without septic shock: Secondary | ICD-10-CM | POA: Diagnosis not present

## 2021-08-06 DIAGNOSIS — R1312 Dysphagia, oropharyngeal phase: Secondary | ICD-10-CM | POA: Diagnosis not present

## 2021-08-06 DIAGNOSIS — M6281 Muscle weakness (generalized): Secondary | ICD-10-CM | POA: Diagnosis not present

## 2021-08-06 DIAGNOSIS — G360 Neuromyelitis optica [Devic]: Secondary | ICD-10-CM

## 2021-08-06 DIAGNOSIS — B965 Pseudomonas (aeruginosa) (mallei) (pseudomallei) as the cause of diseases classified elsewhere: Secondary | ICD-10-CM | POA: Diagnosis present

## 2021-08-06 DIAGNOSIS — M6259 Muscle wasting and atrophy, not elsewhere classified, multiple sites: Secondary | ICD-10-CM | POA: Diagnosis not present

## 2021-08-06 DIAGNOSIS — R2681 Unsteadiness on feet: Secondary | ICD-10-CM | POA: Diagnosis not present

## 2021-08-06 DIAGNOSIS — T83518A Infection and inflammatory reaction due to other urinary catheter, initial encounter: Secondary | ICD-10-CM | POA: Diagnosis present

## 2021-08-06 DIAGNOSIS — G373 Acute transverse myelitis in demyelinating disease of central nervous system: Secondary | ICD-10-CM | POA: Diagnosis not present

## 2021-08-06 DIAGNOSIS — Z7401 Bed confinement status: Secondary | ICD-10-CM | POA: Diagnosis not present

## 2021-08-06 DIAGNOSIS — Z801 Family history of malignant neoplasm of trachea, bronchus and lung: Secondary | ICD-10-CM | POA: Diagnosis not present

## 2021-08-06 DIAGNOSIS — T83511A Infection and inflammatory reaction due to indwelling urethral catheter, initial encounter: Secondary | ICD-10-CM | POA: Diagnosis not present

## 2021-08-06 DIAGNOSIS — N319 Neuromuscular dysfunction of bladder, unspecified: Secondary | ICD-10-CM | POA: Diagnosis present

## 2021-08-06 DIAGNOSIS — A419 Sepsis, unspecified organism: Secondary | ICD-10-CM | POA: Diagnosis not present

## 2021-08-06 DIAGNOSIS — E1165 Type 2 diabetes mellitus with hyperglycemia: Secondary | ICD-10-CM

## 2021-08-06 DIAGNOSIS — E119 Type 2 diabetes mellitus without complications: Secondary | ICD-10-CM | POA: Diagnosis present

## 2021-08-06 DIAGNOSIS — Y738 Miscellaneous gastroenterology and urology devices associated with adverse incidents, not elsewhere classified: Secondary | ICD-10-CM | POA: Diagnosis present

## 2021-08-06 DIAGNOSIS — I2510 Atherosclerotic heart disease of native coronary artery without angina pectoris: Secondary | ICD-10-CM | POA: Diagnosis present

## 2021-08-06 DIAGNOSIS — N390 Urinary tract infection, site not specified: Secondary | ICD-10-CM | POA: Diagnosis present

## 2021-08-06 DIAGNOSIS — R519 Headache, unspecified: Secondary | ICD-10-CM | POA: Diagnosis not present

## 2021-08-06 DIAGNOSIS — I10 Essential (primary) hypertension: Secondary | ICD-10-CM

## 2021-08-06 DIAGNOSIS — M869 Osteomyelitis, unspecified: Secondary | ICD-10-CM | POA: Diagnosis not present

## 2021-08-06 DIAGNOSIS — G8250 Quadriplegia, unspecified: Secondary | ICD-10-CM | POA: Diagnosis present

## 2021-08-06 DIAGNOSIS — Z87891 Personal history of nicotine dependence: Secondary | ICD-10-CM | POA: Diagnosis not present

## 2021-08-06 DIAGNOSIS — Z800 Family history of malignant neoplasm of digestive organs: Secondary | ICD-10-CM | POA: Diagnosis not present

## 2021-08-06 DIAGNOSIS — L89626 Pressure-induced deep tissue damage of left heel: Secondary | ICD-10-CM | POA: Diagnosis present

## 2021-08-06 LAB — CBC WITH DIFFERENTIAL/PLATELET
Abs Immature Granulocytes: 0.03 10*3/uL (ref 0.00–0.07)
Abs Immature Granulocytes: 0.04 10*3/uL (ref 0.00–0.07)
Basophils Absolute: 0 10*3/uL (ref 0.0–0.1)
Basophils Absolute: 0 10*3/uL (ref 0.0–0.1)
Basophils Relative: 0 %
Basophils Relative: 1 %
Eosinophils Absolute: 0.2 10*3/uL (ref 0.0–0.5)
Eosinophils Absolute: 0.3 10*3/uL (ref 0.0–0.5)
Eosinophils Relative: 2 %
Eosinophils Relative: 4 %
HCT: 34.5 % — ABNORMAL LOW (ref 39.0–52.0)
HCT: 38.3 % — ABNORMAL LOW (ref 39.0–52.0)
Hemoglobin: 10.7 g/dL — ABNORMAL LOW (ref 13.0–17.0)
Hemoglobin: 11.6 g/dL — ABNORMAL LOW (ref 13.0–17.0)
Immature Granulocytes: 0 %
Immature Granulocytes: 1 %
Lymphocytes Relative: 18 %
Lymphocytes Relative: 19 %
Lymphs Abs: 1.4 10*3/uL (ref 0.7–4.0)
Lymphs Abs: 1.5 10*3/uL (ref 0.7–4.0)
MCH: 25.2 pg — ABNORMAL LOW (ref 26.0–34.0)
MCH: 26.4 pg (ref 26.0–34.0)
MCHC: 30.3 g/dL (ref 30.0–36.0)
MCHC: 31 g/dL (ref 30.0–36.0)
MCV: 83.3 fL (ref 80.0–100.0)
MCV: 85.2 fL (ref 80.0–100.0)
Monocytes Absolute: 0.4 10*3/uL (ref 0.1–1.0)
Monocytes Absolute: 0.5 10*3/uL (ref 0.1–1.0)
Monocytes Relative: 6 %
Monocytes Relative: 7 %
Neutro Abs: 4.9 10*3/uL (ref 1.7–7.7)
Neutro Abs: 6 10*3/uL (ref 1.7–7.7)
Neutrophils Relative %: 70 %
Neutrophils Relative %: 72 %
Platelets: 256 10*3/uL (ref 150–400)
Platelets: 328 10*3/uL (ref 150–400)
RBC: 4.05 MIL/uL — ABNORMAL LOW (ref 4.22–5.81)
RBC: 4.6 MIL/uL (ref 4.22–5.81)
RDW: 15.6 % — ABNORMAL HIGH (ref 11.5–15.5)
RDW: 15.7 % — ABNORMAL HIGH (ref 11.5–15.5)
WBC: 7.1 10*3/uL (ref 4.0–10.5)
WBC: 8.2 10*3/uL (ref 4.0–10.5)
nRBC: 0 % (ref 0.0–0.2)
nRBC: 0 % (ref 0.0–0.2)

## 2021-08-06 LAB — BASIC METABOLIC PANEL
Anion gap: 9 (ref 5–15)
BUN: 6 mg/dL — ABNORMAL LOW (ref 8–23)
CO2: 21 mmol/L — ABNORMAL LOW (ref 22–32)
Calcium: 8.4 mg/dL — ABNORMAL LOW (ref 8.9–10.3)
Chloride: 105 mmol/L (ref 98–111)
Creatinine, Ser: 0.47 mg/dL — ABNORMAL LOW (ref 0.61–1.24)
GFR, Estimated: >60 mL/min (ref >60)
Glucose, Bld: 177 mg/dL — ABNORMAL HIGH (ref 70–99)
Potassium: 4.6 mmol/L (ref 3.5–5.1)
Sodium: 135 mmol/L (ref 135–145)

## 2021-08-06 LAB — COMPREHENSIVE METABOLIC PANEL
ALT: 13 U/L (ref 0–44)
AST: 14 U/L — ABNORMAL LOW (ref 15–41)
Albumin: 2.6 g/dL — ABNORMAL LOW (ref 3.5–5.0)
Alkaline Phosphatase: 75 U/L (ref 38–126)
Anion gap: 7 (ref 5–15)
BUN: <5 mg/dL — ABNORMAL LOW (ref 8–23)
CO2: 29 mmol/L (ref 22–32)
Calcium: 9 mg/dL (ref 8.9–10.3)
Chloride: 102 mmol/L (ref 98–111)
Creatinine, Ser: 0.5 mg/dL — ABNORMAL LOW (ref 0.61–1.24)
GFR, Estimated: >60 mL/min (ref >60)
Glucose, Bld: 187 mg/dL — ABNORMAL HIGH (ref 70–99)
Potassium: 2.8 mmol/L — ABNORMAL LOW (ref 3.5–5.1)
Sodium: 138 mmol/L (ref 135–145)
Total Bilirubin: 0.6 mg/dL (ref 0.3–1.2)
Total Protein: 6.1 g/dL — ABNORMAL LOW (ref 6.5–8.1)

## 2021-08-06 LAB — URINALYSIS, ROUTINE W REFLEX MICROSCOPIC
Bilirubin Urine: NEGATIVE
Glucose, UA: 50 mg/dL — AB
Ketones, ur: 5 mg/dL — AB
Nitrite: POSITIVE — AB
Protein, ur: 100 mg/dL — AB
RBC / HPF: >50 RBC/hpf — ABNORMAL HIGH (ref 0–5)
Specific Gravity, Urine: 1.033 — ABNORMAL HIGH (ref 1.005–1.030)
WBC, UA: >50 WBC/hpf — ABNORMAL HIGH (ref 0–5)
pH: 7 (ref 5.0–8.0)

## 2021-08-06 LAB — APTT: aPTT: 34 seconds (ref 24–36)

## 2021-08-06 LAB — CBG MONITORING, ED
Glucose-Capillary: 145 mg/dL — ABNORMAL HIGH (ref 70–99)
Glucose-Capillary: 163 mg/dL — ABNORMAL HIGH (ref 70–99)
Glucose-Capillary: 167 mg/dL — ABNORMAL HIGH (ref 70–99)

## 2021-08-06 LAB — HEMOGLOBIN A1C
Hgb A1c MFr Bld: 7.1 % — ABNORMAL HIGH (ref 4.8–5.6)
Mean Plasma Glucose: 157.07 mg/dL

## 2021-08-06 LAB — LACTIC ACID, PLASMA
Lactic Acid, Venous: 1 mmol/L (ref 0.5–1.9)
Lactic Acid, Venous: 1.1 mmol/L (ref 0.5–1.9)

## 2021-08-06 LAB — MAGNESIUM: Magnesium: 1.5 mg/dL — ABNORMAL LOW (ref 1.7–2.4)

## 2021-08-06 LAB — PROTIME-INR
INR: 1.1 (ref 0.8–1.2)
Prothrombin Time: 13.7 seconds (ref 11.4–15.2)

## 2021-08-06 LAB — TROPONIN I (HIGH SENSITIVITY)
Troponin I (High Sensitivity): 9 ng/L (ref <18)
Troponin I (High Sensitivity): 9 ng/L (ref <18)

## 2021-08-06 LAB — GLUCOSE, CAPILLARY: Glucose-Capillary: 158 mg/dL — ABNORMAL HIGH (ref 70–99)

## 2021-08-06 LAB — RESP PANEL BY RT-PCR (FLU A&B, COVID) ARPGX2
Influenza A by PCR: NEGATIVE
Influenza B by PCR: NEGATIVE
SARS Coronavirus 2 by RT PCR: NEGATIVE

## 2021-08-06 LAB — LIPASE, BLOOD: Lipase: 20 U/L (ref 11–51)

## 2021-08-06 MED ORDER — ACETAMINOPHEN 650 MG RE SUPP: 650.0000 mg | Freq: Four times a day (QID) | RECTAL | Status: DC | PRN

## 2021-08-06 MED ORDER — GLUCERNA 1.5 CAL PO LIQD
75.0000 mL/h | ORAL | Status: DC
  Administered 2021-08-06: 75 mL/h
  Filled 2021-08-06 (×3): qty 1000

## 2021-08-06 MED ORDER — BACLOFEN 10 MG PO TABS
5.0000 mg | ORAL_TABLET | Freq: Every day | ORAL | Status: DC
  Administered 2021-08-06 – 2021-08-09 (×4): 5 mg via ORAL
  Filled 2021-08-06 (×4): qty 1
  Filled 2021-08-06: qty 0.5

## 2021-08-06 MED ORDER — LORATADINE 10 MG PO TABS
10.0000 mg | ORAL_TABLET | Freq: Every day | ORAL | Status: DC
  Administered 2021-08-06 – 2021-08-10 (×5): 10 mg via ORAL
  Filled 2021-08-06 (×5): qty 1

## 2021-08-06 MED ORDER — SODIUM CHLORIDE 0.9% FLUSH
3.0000 mL | Freq: Two times a day (BID) | INTRAVENOUS | Status: DC
  Administered 2021-08-06 – 2021-08-10 (×8): 3 mL via INTRAVENOUS

## 2021-08-06 MED ORDER — PROSOURCE TF PO LIQD
45.0000 mL | Freq: Two times a day (BID) | ORAL | Status: DC
  Administered 2021-08-06 – 2021-08-07 (×3): 45 mL
  Filled 2021-08-06 (×3): qty 45

## 2021-08-06 MED ORDER — ALBUTEROL SULFATE (2.5 MG/3ML) 0.083% IN NEBU: 2.5000 mg | INHALATION_SOLUTION | Freq: Four times a day (QID) | RESPIRATORY_TRACT | Status: DC | PRN

## 2021-08-06 MED ORDER — POLYVINYL ALCOHOL 1.4 % OP SOLN: 2.0000 [drp] | Freq: Two times a day (BID) | OPHTHALMIC | Status: DC | PRN

## 2021-08-06 MED ORDER — SENNOSIDES-DOCUSATE SODIUM 8.6-50 MG PO TABS: 1.0000 | ORAL_TABLET | Freq: Every evening | ORAL | Status: DC | PRN

## 2021-08-06 MED ORDER — ONDANSETRON HCL 4 MG/2ML IJ SOLN
4.0000 mg | Freq: Four times a day (QID) | INTRAMUSCULAR | Status: DC | PRN
  Administered 2021-08-06 – 2021-08-07 (×2): 4 mg via INTRAVENOUS
  Filled 2021-08-06 (×2): qty 2

## 2021-08-06 MED ORDER — LIDOCAINE 5 % EX PTCH
1.0000 | MEDICATED_PATCH | CUTANEOUS | Status: DC
  Administered 2021-08-06 – 2021-08-10 (×5): 1 via TRANSDERMAL
  Filled 2021-08-06 (×5): qty 1

## 2021-08-06 MED ORDER — POTASSIUM CHLORIDE IN NACL 20-0.9 MEQ/L-% IV SOLN
INTRAVENOUS | Status: DC
  Filled 2021-08-06 (×2): qty 1000

## 2021-08-06 MED ORDER — PANTOPRAZOLE SODIUM 40 MG PO TBEC
40.0000 mg | DELAYED_RELEASE_TABLET | Freq: Two times a day (BID) | ORAL | Status: DC
  Administered 2021-08-06 – 2021-08-10 (×8): 40 mg via ORAL
  Filled 2021-08-06 (×9): qty 1

## 2021-08-06 MED ORDER — MAGNESIUM SULFATE IN D5W 1-5 GM/100ML-% IV SOLN
1.0000 g | Freq: Once | INTRAVENOUS | Status: AC
  Administered 2021-08-06: 1 g via INTRAVENOUS
  Filled 2021-08-06: qty 100

## 2021-08-06 MED ORDER — ATORVASTATIN CALCIUM 10 MG PO TABS
20.0000 mg | ORAL_TABLET | Freq: Every day | ORAL | Status: DC
  Administered 2021-08-06 – 2021-08-09 (×4): 20 mg via ORAL
  Filled 2021-08-06 (×4): qty 2

## 2021-08-06 MED ORDER — KETOROLAC TROMETHAMINE 30 MG/ML IJ SOLN
15.0000 mg | Freq: Once | INTRAMUSCULAR | Status: AC
  Administered 2021-08-06: 15 mg via INTRAVENOUS
  Filled 2021-08-06: qty 1

## 2021-08-06 MED ORDER — LIDOCAINE 4 % EX PTCH: 1.0000 | MEDICATED_PATCH | Freq: Two times a day (BID) | CUTANEOUS | Status: DC

## 2021-08-06 MED ORDER — DRONABINOL 2.5 MG PO CAPS
2.5000 mg | ORAL_CAPSULE | Freq: Two times a day (BID) | ORAL | Status: DC
  Administered 2021-08-06 – 2021-08-10 (×9): 2.5 mg via ORAL
  Filled 2021-08-06 (×9): qty 1

## 2021-08-06 MED ORDER — TRAMADOL HCL 50 MG PO TABS
50.0000 mg | ORAL_TABLET | Freq: Two times a day (BID) | ORAL | Status: DC | PRN
  Administered 2021-08-06 – 2021-08-08 (×2): 50 mg via ORAL
  Filled 2021-08-06 (×2): qty 1

## 2021-08-06 MED ORDER — MAGNESIUM SULFATE 2 GM/50ML IV SOLN
2.0000 g | Freq: Once | INTRAVENOUS | Status: AC
  Administered 2021-08-06: 2 g via INTRAVENOUS
  Filled 2021-08-06: qty 50

## 2021-08-06 MED ORDER — INSULIN ASPART 100 UNIT/ML IJ SOLN
0.0000 [IU] | Freq: Every day | INTRAMUSCULAR | Status: DC
  Administered 2021-08-07: 3 [IU] via SUBCUTANEOUS
  Administered 2021-08-08: 5 [IU] via SUBCUTANEOUS

## 2021-08-06 MED ORDER — METOCLOPRAMIDE HCL 5 MG/ML IJ SOLN
10.0000 mg | Freq: Once | INTRAMUSCULAR | Status: AC
  Administered 2021-08-06: 10 mg via INTRAVENOUS
  Filled 2021-08-06: qty 2

## 2021-08-06 MED ORDER — POTASSIUM CHLORIDE CRYS ER 20 MEQ PO TBCR
20.0000 meq | EXTENDED_RELEASE_TABLET | Freq: Once | ORAL | Status: AC
  Administered 2021-08-06: 20 meq via ORAL
  Filled 2021-08-06: qty 1

## 2021-08-06 MED ORDER — MAGNESIUM HYDROXIDE 400 MG/5ML PO SUSP
30.0000 mL | Freq: Every day | ORAL | Status: DC | PRN
  Filled 2021-08-06: qty 30

## 2021-08-06 MED ORDER — POTASSIUM CHLORIDE 10 MEQ/100ML IV SOLN
10.0000 meq | INTRAVENOUS | Status: AC
  Administered 2021-08-06 (×3): 10 meq via INTRAVENOUS
  Filled 2021-08-06 (×3): qty 100

## 2021-08-06 MED ORDER — CHLORHEXIDINE GLUCONATE CLOTH 2 % EX PADS
6.0000 | MEDICATED_PAD | Freq: Every day | CUTANEOUS | Status: DC
  Administered 2021-08-06 – 2021-08-10 (×5): 6 via TOPICAL

## 2021-08-06 MED ORDER — FLUTICASONE PROPIONATE 50 MCG/ACT NA SUSP
1.0000 | Freq: Every day | NASAL | Status: DC
  Administered 2021-08-07 – 2021-08-10 (×4): 1 via NASAL
  Filled 2021-08-06: qty 16

## 2021-08-06 MED ORDER — PRO-STAT SUGAR FREE PO LIQD
30.0000 mL | Freq: Two times a day (BID) | ORAL | Status: DC
Start: 2021-08-06 — End: 2021-08-06
  Filled 2021-08-06: qty 30

## 2021-08-06 MED ORDER — ASPIRIN EC 81 MG PO TBEC
81.0000 mg | DELAYED_RELEASE_TABLET | Freq: Every day | ORAL | Status: DC
  Administered 2021-08-06 – 2021-08-10 (×6): 81 mg via ORAL
  Filled 2021-08-06 (×6): qty 1

## 2021-08-06 MED ORDER — MENTHOL (TOPICAL ANALGESIC) 4 % EX GEL
1.0000 | Freq: Three times a day (TID) | CUTANEOUS | Status: DC
Start: 2021-08-06 — End: 2021-08-06

## 2021-08-06 MED ORDER — ENOXAPARIN SODIUM 40 MG/0.4ML IJ SOSY
40.0000 mg | PREFILLED_SYRINGE | INTRAMUSCULAR | Status: DC
  Administered 2021-08-06 – 2021-08-10 (×5): 40 mg via SUBCUTANEOUS
  Filled 2021-08-06 (×5): qty 0.4

## 2021-08-06 MED ORDER — VITAMIN B-12 1000 MCG PO TABS
5000.0000 ug | ORAL_TABLET | ORAL | Status: DC
  Administered 2021-08-09: 5000 ug via ORAL
  Filled 2021-08-06: qty 5

## 2021-08-06 MED ORDER — ONDANSETRON HCL 4 MG PO TABS: 4.0000 mg | ORAL_TABLET | Freq: Four times a day (QID) | ORAL | Status: DC | PRN

## 2021-08-06 MED ORDER — VITAMIN B-12 1000 MCG PO TABS
500.0000 ug | ORAL_TABLET | Freq: Every day | ORAL | Status: DC
  Administered 2021-08-07 – 2021-08-10 (×3): 500 ug via ORAL
  Filled 2021-08-06 (×4): qty 1

## 2021-08-06 MED ORDER — INSULIN GLARGINE-YFGN 100 UNIT/ML ~~LOC~~ SOLN
20.0000 [IU] | Freq: Two times a day (BID) | SUBCUTANEOUS | Status: DC
  Administered 2021-08-06 (×2): 20 [IU] via SUBCUTANEOUS
  Filled 2021-08-06 (×4): qty 0.2

## 2021-08-06 MED ORDER — PSYLLIUM 95 % PO PACK
1.0000 | PACK | Freq: Two times a day (BID) | ORAL | Status: DC
  Administered 2021-08-06 – 2021-08-07 (×3): 1 via ORAL
  Filled 2021-08-06 (×10): qty 1

## 2021-08-06 MED ORDER — LOSARTAN POTASSIUM 50 MG PO TABS
50.0000 mg | ORAL_TABLET | Freq: Every day | ORAL | Status: DC
  Administered 2021-08-06 – 2021-08-10 (×5): 50 mg via ORAL
  Filled 2021-08-06 (×5): qty 1

## 2021-08-06 MED ORDER — SIMETHICONE 80 MG PO CHEW
80.0000 mg | CHEWABLE_TABLET | Freq: Two times a day (BID) | ORAL | Status: DC
  Administered 2021-08-06 – 2021-08-10 (×9): 80 mg via ORAL
  Filled 2021-08-06 (×9): qty 1

## 2021-08-06 MED ORDER — POLYVINYL ALCOHOL 1.4 % OP SOLN
2.0000 [drp] | Freq: Every day | OPHTHALMIC | Status: DC
  Administered 2021-08-06 – 2021-08-10 (×5): 2 [drp] via OPHTHALMIC
  Filled 2021-08-06: qty 15

## 2021-08-06 MED ORDER — ASCORBIC ACID 500 MG PO TABS
500.0000 mg | ORAL_TABLET | Freq: Every day | ORAL | Status: DC
  Administered 2021-08-07 – 2021-08-10 (×5): 500 mg via ORAL
  Filled 2021-08-06 (×5): qty 1

## 2021-08-06 MED ORDER — SODIUM CHLORIDE 0.9 % IV SOLN
2.0000 g | Freq: Two times a day (BID) | INTRAVENOUS | Status: DC
  Administered 2021-08-06 – 2021-08-09 (×8): 2 g via INTRAVENOUS
  Filled 2021-08-06 (×9): qty 2

## 2021-08-06 MED ORDER — SERTRALINE HCL 50 MG PO TABS
50.0000 mg | ORAL_TABLET | Freq: Every day | ORAL | Status: DC
  Administered 2021-08-06 – 2021-08-09 (×4): 50 mg via ORAL
  Filled 2021-08-06 (×4): qty 1

## 2021-08-06 MED ORDER — ACETAMINOPHEN 325 MG PO TABS
650.0000 mg | ORAL_TABLET | Freq: Four times a day (QID) | ORAL | Status: DC | PRN
  Administered 2021-08-06 – 2021-08-07 (×2): 650 mg via ORAL
  Filled 2021-08-06 (×2): qty 2

## 2021-08-06 MED ORDER — INSULIN ASPART 100 UNIT/ML IJ SOLN
0.0000 [IU] | Freq: Three times a day (TID) | INTRAMUSCULAR | Status: DC
  Administered 2021-08-06 (×2): 2 [IU] via SUBCUTANEOUS
  Administered 2021-08-06: 1 [IU] via SUBCUTANEOUS
  Administered 2021-08-07: 5 [IU] via SUBCUTANEOUS
  Administered 2021-08-07 – 2021-08-08 (×3): 3 [IU] via SUBCUTANEOUS
  Administered 2021-08-08: 2 [IU] via SUBCUTANEOUS
  Administered 2021-08-08: 7 [IU] via SUBCUTANEOUS
  Administered 2021-08-09 (×2): 1 [IU] via SUBCUTANEOUS
  Administered 2021-08-09 – 2021-08-10 (×2): 2 [IU] via SUBCUTANEOUS

## 2021-08-06 MED ORDER — TRAZODONE HCL 50 MG PO TABS
50.0000 mg | ORAL_TABLET | Freq: Every day | ORAL | Status: DC
  Administered 2021-08-06 – 2021-08-09 (×4): 50 mg via ORAL
  Filled 2021-08-06 (×4): qty 1

## 2021-08-06 MED ORDER — METHENAMINE MANDELATE 1 G PO TABS
1.0000 g | ORAL_TABLET | Freq: Two times a day (BID) | ORAL | Status: DC
  Administered 2021-08-06 – 2021-08-10 (×9): 1 g via ORAL
  Filled 2021-08-06 (×11): qty 1

## 2021-08-06 MED ORDER — FERROUS GLUCONATE 324 (38 FE) MG PO TABS
324.0000 mg | ORAL_TABLET | Freq: Two times a day (BID) | ORAL | Status: DC
  Administered 2021-08-06 – 2021-08-10 (×9): 324 mg via ORAL
  Filled 2021-08-06 (×11): qty 1

## 2021-08-06 MED ORDER — POTASSIUM CHLORIDE IN NACL 20-0.9 MEQ/L-% IV SOLN
INTRAVENOUS | Status: DC
  Filled 2021-08-06: qty 1000

## 2021-08-06 MED ORDER — RISAQUAD PO CAPS
1.0000 | ORAL_CAPSULE | Freq: Every day | ORAL | Status: DC
  Administered 2021-08-06 – 2021-08-10 (×6): 1 via ORAL
  Filled 2021-08-06 (×6): qty 1

## 2021-08-06 MED ORDER — IOHEXOL 300 MG/ML  SOLN
100.0000 mL | Freq: Once | INTRAMUSCULAR | Status: AC | PRN
  Administered 2021-08-06: 100 mL via INTRAVENOUS

## 2021-08-06 MED ORDER — ARTIFICIAL TEARS OPHTHALMIC OINT: TOPICAL_OINTMENT | OPHTHALMIC | Status: DC | PRN

## 2021-08-06 MED ORDER — SENNOSIDES-DOCUSATE SODIUM 8.6-50 MG PO TABS
2.0000 | ORAL_TABLET | Freq: Every day | ORAL | Status: DC
  Administered 2021-08-06 – 2021-08-10 (×5): 2 via ORAL
  Filled 2021-08-06 (×5): qty 2

## 2021-08-06 MED ORDER — MENTHOL (TOPICAL ANALGESIC) 7.5 % EX PTCH: 1.0000 | MEDICATED_PATCH | Freq: Every day | CUTANEOUS | Status: DC

## 2021-08-06 NOTE — ED Notes (Signed)
Thurmond Butts MD regarding continuous feeding supplement. Per MD, it is okay to hold the feeding until patient gets an inpatient room.

## 2021-08-06 NOTE — Telephone Encounter (Signed)
error 

## 2021-08-06 NOTE — Progress Notes (Addendum)
I have seen and assessed patient and I agree with Dr. Criss Rosales assessment and plan.  Patient is a pleasant 75 year old gentleman history of neuromyelitis optica with paraplegia, neurogenic bladder, CAD, OSA on CPAP, hypertension, insulin-dependent diabetes, pressure ulcers presented to the ED with nausea, loss of appetite, abdominal pain.  Work-up in the ED showed EKG with sinus rhythm, negative chest x-ray, CT head negative, CT abdomen and pelvis revealing Foley with balloon in the prostatic urethra.  Chemistries noted hypokalemia with potassium of 2.8, urinalysis concerning for UTI.  Magnesium noted at 1.5.  We will give magnesium 2 g IV x1, repeat mag level in the morning.  Patient had Foley catheter changed in the ED.  Patient placed empirically on IV cefepime and pancultured.  Hemoglobin stable.  No charge.

## 2021-08-06 NOTE — ED Notes (Signed)
Report given to rn on 6n      

## 2021-08-06 NOTE — ED Notes (Signed)
Pt breakfast tray was set up in front of him, pt stated he couldn't feed himself, but NT peeled his banana for him and he was able hold it and eat it. Pt had eggs on tray he took a bite of them with help of NT and he drank his coffee. Pt wanted NT to get phone out of pouch pt was able to use phone to call wife.

## 2021-08-06 NOTE — Progress Notes (Signed)
Pharmacy Antibiotic Note  Brent [name]. is a 75 y.o. male admitted on 08/05/2021 with UTI.  Pharmacy has been consulted for Cefepime dosing. WBC WNL. Renal function good.   Plan: Cefepime 2g IV q12h Trend WBC, temp, renal function  F/U infectious work-up   Height: 6' (182.9 cm) Weight: 90.7 kg (200 lb) IBW/kg (Calculated) : 77.6  Temp (24hrs), Avg:99.4 F (37.4 C), Min:99.4 F (37.4 C), Max:99.4 F (37.4 C)  Recent Labs  Lab 08/05/21 2341  WBC 8.2  CREATININE 0.50*  LATICACIDVEN 1.0    Estimated Creatinine Clearance: 86.2 mL/min (A) (by C-G formula based on SCr of 0.5 mg/dL (L)).    Allergies  Allergen Reactions   Levofloxacin Rash   Penicillins Hives and Rash    Tolerated Unasyn, Augmentin, cefepime and cephalexin    Narda Bonds, PharmD, BCPS Clinical Pharmacist Phone: [phone]

## 2021-08-06 NOTE — ED Notes (Signed)
Potassium infusion slowed to 1ml/hr d/t pt complaint of burning

## 2021-08-06 NOTE — H&P (Signed)
History and Physical    Brent [name]. [name] DOB: 10/06/1945 DOA: 08/05/2021  PCP: Raymondo Band, MD   Patient coming from: SNF   Chief Complaint: Nausea, loss of appetite, abdominal pain   HPI: Brent [name]. is a pleasant 75 y.o. male with medical history significant for neuromyelitis optica with paraplegia and neurogenic bladder, CAD, OSA on CPAP, hypertension, insulin-dependent diabetes mellitus, and pressure ulcers, now presenting to the emergency department with nausea, loss of appetite, and abdominal pain.  Patient reports 3 to 4 days of nausea without vomiting, but making it difficult for him to eat anything.  He has had abdominal pain over the same interval that is worse in the lower abdomen and constant.  He denies fevers or chills.  He normally eats a regular diet, tolerates thin liquids and pills by mouth, and states that his gastrostomy tube is used only for nutritional supplements.    Patient developed lower extremity and bilateral hand weakness in June 2021 while in the hospital, was ultimately diagnosed with neuromyelitis optica, was treated with IV Solu-Medrol and IVIG initially, then Rituxan.  He had sigmoid colostomy created in September 2021 due to sacral ulcer and incontinence.  ED Course: Upon arrival to the ED, patient is found to be afebrile, saturating well on room air, and with stable blood pressure.  EKG features sinus rhythm and chest x-ray negative for acute cardiopulmonary disease.  Head CT negative for acute intracranial abnormality.  CT of the abdomen and pelvis reveals Foley with balloon in the prostatic urethra and trace bilateral pleural effusions.  Chemistry panel notable for potassium 2.8 and albumin 2.6.  CBC with slight normocytic anemia.  Lactic acid is normal.  INR and troponin is normal.  Blood and urine cultures were collected in the ED and the patient was treated with a liter of LR, Toradol, Reglan, IV potassium, and cefepime.  Foley  was replaced in the ED.  Review of Systems:  All other systems reviewed and apart from HPI, are negative.  Past Medical History:  Diagnosis Date   Allergy    CAD (coronary artery disease)    a. angioplasty of his RCA in 1990. b. bare metal stent placed in the RCA in 2000 followed by rotational atherectomy shortly after for stent restenosis. c. last cath was in 2012 showing stable moderate diffuse CAD. (70% mid LAD, 80% diagonal, 70% Ramus, 40% mid to distal RCA stent restenosis). d. Low risk nuc in 2015.   Diabetes mellitus    Diverticulosis    Elevated CK    Erectile dysfunction    Hemorrhoids    HTN (hypertension)    Hyperlipidemia    Hypertriglyceridemia    Malignant melanoma of left side of neck ([place]) 10/25/2018   Myocardial infarction (HCC)    Obesity    OSA (obstructive sleep apnea)    Persistent disorder of initiating or maintaining sleep    Personal history of colonic polyps 02/05/2003    Past Surgical History:  Procedure Laterality Date   CHOLECYSTECTOMY     CORONARY ANGIOPLASTY     CORONARY STENT PLACEMENT     stenting of the right coronary artery with followup rotational  atherectomy. (3 stents placed)   FINGER SURGERY     right   FOOT SURGERY     right   INGUINAL HERNIA REPAIR     right   IRRIGATION AND DEBRIDEMENT ABSCESS N/A 06/04/2020   Procedure: IRRIGATION AND DEBRIDEMENT SACRAL WOUND;  Surgeon: Jesusita Oka,  MD;  Location: [facility];  Service: General;  Laterality: N/A;   LAPAROSCOPY N/A 06/04/2020   Procedure: LAPAROSCOPY ASSISTED COLOSTOMY, LAPAROSCOPY GASTROSTOMY;  Surgeon: Jesusita Oka, MD;  Location: [facility];  Service: General;  Laterality: N/A;   LYSIS OF ADHESION N/A 06/04/2020   Procedure: LYSIS OF ADHESION;  Surgeon: Jesusita Oka, MD;  Location: [facility];  Service: General;  Laterality: N/A;   melanoma removal     neck   ORTHOPEDIC SURGERY     foot right   rotator cuff surg     Bil    Social History:   reports that he quit smoking about 40  years ago. His smoking use included cigarettes. He has a 45.00 pack-year smoking history. He has never used smokeless tobacco. He reports that he does not currently use alcohol after a past usage of about 7.0 - 14.0 standard drinks per week. He reports that he does not use drugs.  Allergies  Allergen Reactions   Levofloxacin Rash   Penicillins Hives and Rash    Tolerated Unasyn, Augmentin, cefepime and cephalexin    Family History  Problem Relation Age of Onset   Liver cancer Father    Lung cancer Father    Heart disease Father    Heart disease Sister    Lung cancer Mother    COPD Mother    Obesity Mother    Colon cancer Neg Hx    Stomach cancer Neg Hx    Pancreatic cancer Neg Hx      Prior to Admission medications   Medication Sig Start Date End Date Taking? Authorizing Provider  acetaminophen (TYLENOL) 325 MG tablet Take 2 tablets (650 mg total) by mouth every 6 (six) hours as needed for headache. Patient not taking: Reported on 03/24/2021 04/24/20   Bary Leriche, PA-C  albuterol (PROVENTIL) (2.5 MG/3ML) 0.083% nebulizer solution Take 3 mLs (2.5 mg total) by nebulization once as needed for wheezing or shortness of breath (or anaphylaxis due to Rituxan infusion). Patient taking differently: Take 2.5 mg by nebulization every 6 (six) hours as needed for wheezing or shortness of breath (or anaphylaxis due to Rituxan infusion). 04/24/20   Love, Ivan Anchors, PA-C  Amino Acids-Protein Hydrolys (FEEDING SUPPLEMENT, PRO-STAT SUGAR FREE 64,) LIQD Take 30 mLs by mouth in the morning and at bedtime.    [provider]  antiseptic oral rinse (BIOTENE) LIQD 10 mLs by Mouth Rinse route in the morning, at noon, and at bedtime. Swish & Spit    [provider]  Ascorbic Acid (VITA-C PO) Take 500 mg by mouth daily. Patient not taking: Reported on 03/24/2021    [provider]  aspirin EC 81 MG tablet Take 81 mg by mouth daily. Swallow whole.    [provider]   atorvastatin (LIPITOR) 20 MG tablet Take 1 tablet (20 mg total) by mouth daily. Patient taking differently: Take 20 mg by mouth at bedtime. 03/26/20   Shelly Coss, MD  Baclofen 5 MG TABS Take 1 tablet by mouth at bedtime. 02/25/21   [provider]  collagenase (SANTYL) ointment Apply topically daily. Patient not taking: No sig reported 11/23/20   Dana Allan I, MD  dronabinol (MARINOL) 2.5 MG capsule Take 2.5 mg by mouth in the morning and at bedtime. 01/28/21   [provider]  ferrous gluconate (FERGON) 324 MG tablet Take 324 mg by mouth 2 (two) times daily with a meal.    [provider]  fluticasone (FLONASE) 50 MCG/ACT  nasal spray Place 2 sprays into both nostrils 2 (two) times daily. Patient taking differently: Place 1 spray into both nostrils daily. 04/24/20   Love, Ivan Anchors, PA-C  gabapentin (NEURONTIN) 300 MG capsule Place 1 capsule (300 mg total) into feeding tube at bedtime. Patient not taking: No sig reported 06/10/20   Rai, Vernelle Emerald, MD  Icosapent Ethyl (VASCEPA) 1 g CAPS Take 2 capsules (2 g total) by mouth 2 (two) times daily. Patient not taking: No sig reported 07/19/19   Melina Copa N, PA-C  insulin aspart (NOVOLOG) 100 UNIT/ML injection Inject 0-15 Units into the skin every 4 (four) hours. Sliding scale  CBG 70 - 120: 0 units: CBG 121 - 150: 2 units; CBG 151 - 200: 3 units; CBG 201 - 250: 5 units; CBG 251 - 300: 8 units;CBG 301 - 350: 11 units; CBG 351 - 400: 15 units; CBG > 400 : 15 units and notify MD Patient taking differently: Inject 0-15 Units into the skin See admin instructions. Sliding scale  If CBG>70 Call NP/PA, CBG 70 - 120: 0 units: CBG 121 - 150: 2 units; CBG 151 - 200: 3 units; CBG 201 - 250: 5 units; CBG 251 - 300: 8 units; CBG 301 - 350: 11 units; CBG 351 - 400: 15units and  CBG >400: 15 Units, CBG>400 Call MD 06/10/20   Rai, Ripudeep K, MD  insulin glargine (LANTUS) 100 unit/mL SOPN Inject 15 Units into the skin at bedtime. Patient  taking differently: Inject 23 Units into the skin 2 (two) times daily. 06/10/20   Rai, Ripudeep K, MD  Lidocaine 4 % PTCH Apply 1 patch topically 2 (two) times daily. Apply to right hip in the morning remove at night    [provider]  lisinopril (ZESTRIL) 20 MG tablet Take 1 tablet by mouth daily. 01/01/21   [provider]  magnesium hydroxide (MILK OF MAGNESIA) 400 MG/5ML suspension Take 30 mLs by mouth daily as needed for mild constipation.    [provider]  Menthol (ICY HOT ADVANCED RELIEF) 7.5 % PTCH Apply 1 patch topically daily.    [provider]  Menthol, Topical Analgesic, (BIOFREEZE) 4 % GEL Apply 1 application topically in the morning, at noon, and at bedtime.     [provider]  metFORMIN (GLUCOPHAGE) 500 MG tablet Take 250 mg by mouth 2 (two) times daily with a meal.    [provider]  methenamine (HIPREX) 1 g tablet Take 1 g by mouth 2 (two) times daily. 02/22/21   [provider]  Nutritional Supplements (FEEDING SUPPLEMENT, GLUCERNA 1.5 CAL,) LIQD Place 75 mLs into feeding tube continuous. 75 ml/hr    [provider]  pantoprazole (PROTONIX) 40 MG tablet Take 40 mg by mouth 2 (two) times daily. 10/19/20   [provider]  polyvinyl alcohol (LIQUIFILM TEARS) 1.4 % ophthalmic solution Place 2 drops into both eyes 2 (two) times daily as needed for dry eyes.    [provider]  PSYLLIUM PO Take 0.4 g by mouth in the morning and at bedtime.    [provider]  senna-docusate (SENOKOT-S) 8.6-50 MG tablet Take 2 tablets by mouth daily.    [provider]  sertraline (ZOLOFT) 50 MG tablet Take 50 mg by mouth at bedtime. 11/10/20   [provider]  tamsulosin (FLOMAX) 0.4 MG CAPS capsule Take 1 capsule (0.4 mg total) by mouth daily after supper. Patient not taking: No sig reported 06/10/20   Rai, Ripudeep K,  MD  traMADol (ULTRAM) 50 MG tablet Take 50 mg by mouth 2 (two) times  daily as needed for moderate pain.    [provider]  traZODone (DESYREL) 50 MG tablet Take 50 mg by mouth at bedtime.    [provider]  vitamin B-12 (CYANOCOBALAMIN) 500 MCG tablet Take 500 mcg by mouth daily.    [provider]  zinc sulfate 220 (50 Zn) MG capsule Place 1 capsule (220 mg total) into feeding tube daily. Patient not taking: No sig reported 06/10/20   Mendel Corning, MD    Physical Exam: Vitals:   08/06/21 0200 08/06/21 0215 08/06/21 0230 08/06/21 0245  BP: (!) 183/78 (!) 191/85 (!) 170/75 (!) 162/74  Pulse: 68 73 65 64  Resp: (!) 21 14 (!) 0 19  Temp:      TempSrc:      SpO2: 96% 99% 97% 97%  Weight:      Height:        Constitutional: NAD, calm  Eyes: PERTLA, lids and conjunctivae normal ENMT: Mucous membranes are moist. Posterior pharynx clear of any exudate or lesions.   Neck: supple, no masses  Respiratory: no wheezing, no crackles. No accessory muscle use.  Cardiovascular: S1 & S2 heard, regular rate and rhythm. No extremity edema.   Abdomen: No distension, no tenderness, soft. Colostomy and gastrostomy tube noted. Bowel sounds active.  Musculoskeletal: no clubbing / cyanosis. No joint deformity upper and lower extremities. Atrophy involving b/l hands and LEs.   Skin: Poor turgor. Warm, dry, well-perfused. Neurologic: CN 2-12 grossly intact. Bilateral hand weakness. Paraplegia.  Psychiatric: Very pleasant. Cooperative.    Labs and Imaging on Admission: I have personally reviewed following labs and imaging studies  CBC: Recent Labs  Lab 08/05/21 2341  WBC 8.2  NEUTROABS 6.0  HGB 11.6*  HCT 38.3*  MCV 83.3  PLT 546   Basic Metabolic Panel: Recent Labs  Lab 08/05/21 2341  NA 138  K 2.8*  CL 102  CO2 29  GLUCOSE 187*  BUN <5*  CREATININE 0.50*  CALCIUM 9.0   GFR: Estimated Creatinine Clearance: 86.2 mL/min (A) (by C-G formula based on SCr of 0.5 mg/dL (L)). Liver Function Tests: Recent Labs  Lab  08/05/21 2341  AST 14*  ALT 13  ALKPHOS 75  BILITOT 0.6  PROT 6.1*  ALBUMIN 2.6*   Recent Labs  Lab 08/05/21 2341  LIPASE 20   No results for input(s): AMMONIA in the last 168 hours. Coagulation Profile: Recent Labs  Lab 08/05/21 2341  INR 1.1   Cardiac Enzymes: No results for input(s): CKTOTAL, CKMB, CKMBINDEX, TROPONINI in the last 168 hours. BNP (last 3 results) No results for input(s): PROBNP in the last 8760 hours. HbA1C: No results for input(s): HGBA1C in the last 72 hours. CBG: Recent Labs  Lab 08/05/21 2330  GLUCAP 183*   Lipid Profile: No results for input(s): CHOL, HDL, LDLCALC, TRIG, CHOLHDL, LDLDIRECT in the last 72 hours. Thyroid Function Tests: No results for input(s): TSH, T4TOTAL, FREET4, T3FREE, THYROIDAB in the last 72 hours. Anemia Panel: No results for input(s): VITAMINB12, FOLATE, FERRITIN, TIBC, IRON, RETICCTPCT in the last 72 hours. Urine analysis:    Component Value Date/Time   COLORURINE YELLOW 08/05/2021 2326   APPEARANCEUR CLOUDY (A) 08/05/2021 2326   LABSPEC 1.033 (H) 08/05/2021 2326   PHURINE 7.0 08/05/2021 2326   GLUCOSEU 50 (A) 08/05/2021 2326   HGBUR MODERATE (A) 08/05/2021 2326   BILIRUBINUR NEGATIVE 08/05/2021 2326   BILIRUBINUR  n 03/18/2015 0928   KETONESUR 5 (A) 08/05/2021 2326   PROTEINUR 100 (A) 08/05/2021 2326   UROBILINOGEN 2.0 03/18/2015 0928   UROBILINOGEN 1.0 07/16/2012 1345   NITRITE POSITIVE (A) 08/05/2021 2326   LEUKOCYTESUR LARGE (A) 08/05/2021 2326   Sepsis Labs: @LABRCNTIP (procalcitonin:4,lacticidven:4) ) Recent Results (from the past 240 hour(s))  Resp Panel by RT-PCR (Flu A&B, Covid) Nasopharyngeal Swab     Status: None   Collection Time: 08/05/21 11:26 PM   Specimen: Nasopharyngeal Swab; Nasopharyngeal(NP) swabs in vial transport medium  Result Value Ref Range Status   SARS Coronavirus 2 by RT PCR NEGATIVE NEGATIVE Final    Comment: (NOTE) SARS-CoV-2 target nucleic acids are NOT DETECTED.  The  SARS-CoV-2 RNA is generally detectable in upper respiratory specimens during the acute phase of infection. The lowest concentration of SARS-CoV-2 viral copies this assay can detect is 138 copies/mL. A negative result does not preclude SARS-Cov-2 infection and should not be used as the sole basis for treatment or other patient management decisions. A negative result may occur with  improper specimen collection/handling, submission of specimen other than nasopharyngeal swab, presence of viral mutation(s) within the areas targeted by this assay, and inadequate number of viral copies(<138 copies/mL). A negative result must be combined with clinical observations, patient history, and epidemiological information. The expected result is Negative.  Fact Sheet for Patients:  EntrepreneurPulse.com.au  Fact Sheet for Healthcare Providers:  IncredibleEmployment.be  This test is no t yet approved or cleared by the [place] FDA and  has been authorized for detection and/or diagnosis of SARS-CoV-2 by FDA under an Emergency Use Authorization (EUA). This EUA will remain  in effect (meaning this test can be used) for the duration of the COVID-19 declaration under Section 564(b)(1) of the Act, 21 U.S.C.section 360bbb-3(b)(1), unless the authorization is terminated  or revoked sooner.       Influenza A by PCR NEGATIVE NEGATIVE Final   Influenza B by PCR NEGATIVE NEGATIVE Final    Comment: (NOTE) The Xpert Xpress SARS-CoV-2/FLU/RSV plus assay is intended as an aid in the diagnosis of influenza from Nasopharyngeal swab specimens and should not be used as a sole basis for treatment. Nasal washings and aspirates are unacceptable for Xpert Xpress SARS-CoV-2/FLU/RSV testing.  Fact Sheet for Patients: EntrepreneurPulse.com.au  Fact Sheet for Healthcare Providers: IncredibleEmployment.be  This test is not yet approved or  cleared by the [place] FDA and has been authorized for detection and/or diagnosis of SARS-CoV-2 by FDA under an Emergency Use Authorization (EUA). This EUA will remain in effect (meaning this test can be used) for the duration of the COVID-19 declaration under Section 564(b)(1) of the Act, 21 U.S.C. section 360bbb-3(b)(1), unless the authorization is terminated or revoked.  Performed at [facility], [place] [address] [place], [place] [address]      Radiological Exams on Admission: CT Head Wo Contrast  Result Date: 08/06/2021 CLINICAL DATA:  Headache.  Concern for intracranial hemorrhage. EXAM: CT HEAD WITHOUT CONTRAST TECHNIQUE: Contiguous axial images were obtained from the base of the skull through the vertex without intravenous contrast. COMPARISON:  Head CT dated 03/10/2020. FINDINGS: Brain: Mild age-related atrophy and chronic microvascular ischemic changes. There is no acute intracranial hemorrhage. No mass effect or midline shift. Right posterior fossa probable arachnoid cyst similar to prior CT. Vascular: No hyperdense vessel or unexpected calcification. Skull: Normal. Negative for fracture or focal lesion. Sinuses/Orbits: No acute finding. Other: None IMPRESSION: 1. No acute intracranial pathology. 2. Mild age-related atrophy and  chronic microvascular ischemic changes. Electronically Signed   By: Anner Crete M.D.   On: 08/06/2021 01:31   CT ABDOMEN PELVIS W CONTRAST  Result Date: 08/06/2021 CLINICAL DATA:  Abdominal pain EXAM: CT ABDOMEN AND PELVIS WITH CONTRAST TECHNIQUE: Multidetector CT imaging of the abdomen and pelvis was performed using the standard protocol following bolus administration of intravenous contrast. CONTRAST:  141mL OMNIPAQUE IOHEXOL 300 MG/ML  SOLN COMPARISON:  CT abdomen pelvis dated 12/21/2020. FINDINGS: Lower chest: Trace bilateral pleural effusions with associated partial compressive atelectasis of the lower lobes. There is 3 vessel coronary  vascular calcification. Partially visualized small pericardial effusion. No intra-abdominal free air or free fluid. Hepatobiliary: Fatty liver. No intrahepatic biliary dilatation. Cholecystectomy. Pancreas: Unremarkable. No pancreatic ductal dilatation or surrounding inflammatory changes. Spleen: Normal in size without focal abnormality. Adrenals/Urinary Tract: The adrenal glands unremarkable. Similar appearance of cyst in the upper pole of the left kidney with layering high density material. There is a 2 cm left renal medial interpolar cyst. There is no hydronephrosis on either side. There is symmetric enhancement and excretion of contrast by both kidneys. Abdominal the visualized ureters appear unremarkable. Diffuse thickened and hazy appearance of the bladder wall concerning for cystitis. Correlation with urinalysis recommended. A Foley catheter is noted with balloon in the region of the prostatic urethra. Recommend further advancing of the catheter by additional 7 cm. Stomach/Bowel: Percutaneous gastrostomy with balloon in the body of the stomach. There is severe sigmoid and scattered colonic diverticulosis without active inflammatory changes. There is a left lower quadrant loop colostomy. There is no bowel obstruction or active inflammation. The appendix is normal. Vascular/Lymphatic: Advanced aortoiliac atherosclerotic disease. The IVC is unremarkable. No portal venous gas. There is no adenopathy. Reproductive: The prostate is grossly unremarkable Other: Of diffuse subcutaneous edema. Musculoskeletal: Osteopenia with degenerative changes of the spine and hips. No acute osseous pathology. IMPRESSION: 1. Foley catheter with balloon in the region of the prostatic urethra. Recommend further advancing of the catheter by additional 7 cm. 2. Severe colonic diverticulosis. No bowel obstruction. Normal appendix. 3. Fatty liver. 4. Trace bilateral pleural effusions with associated partial compressive atelectasis of the  lower lobes. 5. Aortic Atherosclerosis (ICD10-I70.0). Electronically Signed   By: Anner Crete M.D.   On: 08/06/2021 01:36   DG Chest Port 1 View  Result Date: 08/05/2021 CLINICAL DATA:  Headache and nausea. EXAM: PORTABLE CHEST 1 VIEW COMPARISON:  December 21, 2020 FINDINGS: The heart size and mediastinal contours are within normal limits. There is moderate severity calcification of the aortic arch. Both lungs are clear. A 3.7 cm x 3.2 cm well-defined opacity is seen overlying the body of the stomach. The visualized skeletal structures are unremarkable. IMPRESSION: No active disease. Electronically Signed   By: Virgina Norfolk M.D.   On: 08/05/2021 23:45    EKG: Independently reviewed. Sinus rhythm.    Assessment/Plan   1. UTI, catheter-associated  - Presents with nausea, loss of appetite, and lower abdominal pain and found to have malpositioned Foley catheter and UA concerning for possible infection  - He is not septic on admission  - Foley was replaced, blood and urine cultures collected, and empiric cefepime started in ED  - Continue cefepime, follow cultures and clinical course    2. Hypokalemia  - Serum potassium 2.8 on admission  - Replace, repeat chemistries    3. IDDM  - A1c was 8.2% in February 2022  - Continue CBG checks and insulin   4. Neuromyelitis optica; paraplegia  -  Followed by neurology, planned for next course of Rituxan in December  - Continue supportive care    5. Sacral wound  - No acutely infected  - Continue wound care    6. Hypertension  - Continue losartan    7. CAD - No anginal complaints  - Continue ASA and Lipitor   8. Abdominal pain, nausea  - Exam benign, no acute intraabdominal findings on CT  - Improved in ED with Reglan and IVF  - Possibly from UTI and/or severe hypokalemia  - Continue IVF, as-needed antiemetics, treat UTI and hypokalemia  9. Headache  - No acute findings on head CT  - Resolved with one dose of Toradol and Reglan in  ED    DVT prophylaxis: Lovenox  Code Status: Full, confirmed with patient on admission  Level of Care: Level of care: Telemetry Medical Family Communication: None present  Disposition Plan:  Patient is from: SNF  Anticipated d/c is to: SNF  Anticipated d/c date is: 08/07/21 or 08/08/21 Patient currently: pending improvement in potassium, tolerance of adequate oral intake  Consults called: none  Admission status: Observation     Vianne Bulls, MD [facility]  08/06/2021, 4:32 AM

## 2021-08-07 ENCOUNTER — Other Ambulatory Visit: Payer: Self-pay

## 2021-08-07 DIAGNOSIS — E1165 Type 2 diabetes mellitus with hyperglycemia: Secondary | ICD-10-CM | POA: Diagnosis not present

## 2021-08-07 DIAGNOSIS — N390 Urinary tract infection, site not specified: Secondary | ICD-10-CM

## 2021-08-07 DIAGNOSIS — I2510 Atherosclerotic heart disease of native coronary artery without angina pectoris: Secondary | ICD-10-CM | POA: Diagnosis not present

## 2021-08-07 DIAGNOSIS — T83511A Infection and inflammatory reaction due to indwelling urethral catheter, initial encounter: Secondary | ICD-10-CM | POA: Diagnosis not present

## 2021-08-07 LAB — BASIC METABOLIC PANEL
Anion gap: 6 (ref 5–15)
BUN: 6 mg/dL — ABNORMAL LOW (ref 8–23)
CO2: 23 mmol/L (ref 22–32)
Calcium: 8.4 mg/dL — ABNORMAL LOW (ref 8.9–10.3)
Chloride: 107 mmol/L (ref 98–111)
Creatinine, Ser: 0.48 mg/dL — ABNORMAL LOW (ref 0.61–1.24)
GFR, Estimated: >60 mL/min (ref >60)
Glucose, Bld: 200 mg/dL — ABNORMAL HIGH (ref 70–99)
Potassium: 3.3 mmol/L — ABNORMAL LOW (ref 3.5–5.1)
Sodium: 136 mmol/L (ref 135–145)

## 2021-08-07 LAB — GLUCOSE, CAPILLARY
Glucose-Capillary: 235 mg/dL — ABNORMAL HIGH (ref 70–99)
Glucose-Capillary: 238 mg/dL — ABNORMAL HIGH (ref 70–99)
Glucose-Capillary: 217 mg/dL — ABNORMAL HIGH (ref 70–99)
Glucose-Capillary: 291 mg/dL — ABNORMAL HIGH (ref 70–99)

## 2021-08-07 LAB — CBC
HCT: 33.5 % — ABNORMAL LOW (ref 39.0–52.0)
Hemoglobin: 10.3 g/dL — ABNORMAL LOW (ref 13.0–17.0)
MCH: 25.8 pg — ABNORMAL LOW (ref 26.0–34.0)
MCHC: 30.7 g/dL (ref 30.0–36.0)
MCV: 83.8 fL (ref 80.0–100.0)
Platelets: 273 10*3/uL (ref 150–400)
RBC: 4 MIL/uL — ABNORMAL LOW (ref 4.22–5.81)
RDW: 15.6 % — ABNORMAL HIGH (ref 11.5–15.5)
WBC: 6.4 10*3/uL (ref 4.0–10.5)
nRBC: 0 % (ref 0.0–0.2)

## 2021-08-07 LAB — MAGNESIUM: Magnesium: 1.8 mg/dL (ref 1.7–2.4)

## 2021-08-07 MED ORDER — INSULIN GLARGINE-YFGN 100 UNIT/ML ~~LOC~~ SOLN
30.0000 [IU] | Freq: Two times a day (BID) | SUBCUTANEOUS | Status: DC
  Administered 2021-08-07 – 2021-08-10 (×6): 30 [IU] via SUBCUTANEOUS
  Filled 2021-08-07 (×8): qty 0.3

## 2021-08-07 MED ORDER — POTASSIUM CHLORIDE CRYS ER 20 MEQ PO TBCR
40.0000 meq | EXTENDED_RELEASE_TABLET | Freq: Once | ORAL | Status: AC
  Administered 2021-08-07: 40 meq via ORAL
  Filled 2021-08-07: qty 2

## 2021-08-07 MED ORDER — DIPHENHYDRAMINE HCL 25 MG PO CAPS
25.0000 mg | ORAL_CAPSULE | Freq: Once | ORAL | Status: AC
  Administered 2021-08-07: 25 mg via ORAL
  Filled 2021-08-07: qty 1

## 2021-08-07 MED ORDER — MAGNESIUM SULFATE 2 GM/50ML IV SOLN
2.0000 g | Freq: Once | INTRAVENOUS | Status: AC
  Administered 2021-08-07: 2 g via INTRAVENOUS
  Filled 2021-08-07: qty 50

## 2021-08-07 MED ORDER — JUVEN PO PACK
1.0000 | PACK | Freq: Two times a day (BID) | ORAL | Status: DC
  Administered 2021-08-07 – 2021-08-08 (×3): 1
  Filled 2021-08-07 (×6): qty 1

## 2021-08-07 MED ORDER — INFLUENZA VAC A&B SA ADJ QUAD 0.5 ML IM PRSY
0.5000 mL | PREFILLED_SYRINGE | INTRAMUSCULAR | Status: AC
  Administered 2021-08-08: 0.5 mL via INTRAMUSCULAR
  Filled 2021-08-07: qty 0.5

## 2021-08-07 MED ORDER — ENSURE MAX PROTEIN PO LIQD
11.0000 [oz_av] | Freq: Every day | ORAL | Status: DC
  Administered 2021-08-08: 11 [oz_av] via ORAL
  Filled 2021-08-07 (×4): qty 330

## 2021-08-07 MED ORDER — PROSOURCE PLUS PO LIQD
30.0000 mL | Freq: Two times a day (BID) | ORAL | Status: DC
  Administered 2021-08-07 – 2021-08-10 (×2): 30 mL via ORAL
  Filled 2021-08-07 (×4): qty 30

## 2021-08-07 MED ORDER — ADULT MULTIVITAMIN W/MINERALS CH
1.0000 | ORAL_TABLET | Freq: Every day | ORAL | Status: DC
  Administered 2021-08-07 – 2021-08-10 (×4): 1 via ORAL
  Filled 2021-08-07 (×4): qty 1

## 2021-08-07 MED ORDER — INSULIN GLARGINE-YFGN 100 UNIT/ML ~~LOC~~ SOLN
24.0000 [IU] | Freq: Two times a day (BID) | SUBCUTANEOUS | Status: DC
  Administered 2021-08-07: 24 [IU] via SUBCUTANEOUS
  Filled 2021-08-07 (×4): qty 0.24

## 2021-08-07 NOTE — Progress Notes (Signed)
Wounds to sacrum ,buttocks,right calf and left heel changed this shift. New colostomy appliance placed as well. Will continue to monitor.

## 2021-08-07 NOTE — Plan of Care (Signed)
Received the patient from the ED alert and oriented. No scute distress noted. Patient settled into the room, admission assessment completed. Will continue to monitor according to orders and plan of care.

## 2021-08-07 NOTE — Progress Notes (Signed)
PROGRESS NOTE    Brent [name].  QBH:[id number] DOB: 1945/10/27 DOA: 08/05/2021 PCP: Raymondo Band, MD    Chief Complaint  Patient presents with   Headache    Brief Narrative:  Patient 75 year old gentleman history of neuromyelitis optica with paraplegia and neurogenic bladder, CAD, OSA on CPAP, hypertension, insulin-dependent diabetes mellitus, pressure ulcers presented to the ED with nausea, loss of appetite, abdominal pain, 3 to 4 days of nausea with difficulty with oral intake.  Patient with history of sigmoid colostomy created September 2021 due to sacral ulcer and incontinence.  Patient seen in the ED head CT negative, CT abdomen and pelvis with Foley balloon in the prostatic urethra and trace bilateral pleural effusions.  Patient noted on lab work to have a potassium of 2.8, albumin of 2.6, normocytic anemia, normal troponin, urinalysis concerning for UTI.  Patient pancultured.  Patient placed empirically on IV cefepime.   Assessment & Plan:   Principal Problem:   Catheter-associated urinary tract infection (HCC) Active Problems:   Obstructive sleep apnea   Essential hypertension   Paraplegia (HCC)   Coronary artery disease involving native coronary artery of native heart without angina pectoris   Controlled type 2 diabetes mellitus with hyperglycemia, with long-term current use of insulin (HCC)   Neurogenic bladder   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HCC)   Sacral ulcer, with necrosis of muscle (HCC)   Hypokalemia  #1 catheter associated UTI -Patient with chronic Foley catheter presented with nausea, decreased appetite, lower abdominal pain malpositioned Foley catheter urinalysis concerning for UTI. -Preliminary urine cultures with 70,000 colonies of Pseudomonas aeruginosa, sensitivities pending. -Foley catheter replaced in the ED and pancultured. -Some clinical improvement. -Continue IV cefepime.  2.  Hypokalemia -Potassium at 3.3 this morning. -Magnesium at  1.8. -KDur 40 mEq p.o. x1. -Magnesium sulfate 2 g IV x1. -Repeat labs in the morning.  3.  Insulin-dependent diabetes mellitus -Hemoglobin A1c 7.1 (08/06/2021) -CBG 235 this morning. -Patient seen by dietitian and liberalization of diet recommended. -Increase Semglee to 30 units twice daily. -Continue SSI. -May need meal coverage NovoLog however will monitor for now.  4.  Sacral wound -No acute infection. -Continue current wound care. -Placed on air mattress overlay.  5.  Neuromyelitis optica/paraplegia -Being followed by neurology in the outpatient setting plan for next course of Rituxan in December. -Supportive care.  6.  Coronary artery disease -Stable. -Continue aspirin, Lipitor, losartan.  7.  HTN Losartan.  8.  Abdominal pain, nausea -Likely secondary to UTI. -Clinical improvement. -CT abdomen pelvis with no acute intra-abdominal findings. -Continue supportive care, IV antibiotics.  9.  Headache -Head CT negative. -Resolved after dose of Toradol and Reglan in the ED.  10.  OSA -CPAP nightly.   DVT prophylaxis: Lovenox Code Status: Full Family Communication: Updated patient.  No family at bedside. Disposition:   Status is: Inpatient  Remains inpatient appropriate because: Severity of illness       Consultants:  None  Procedures: CT head 08/06/2021 CT abdomen pelvis 08/06/2022 Chest x-ray 08/05/2021  Antimicrobials: IV cefepime 08/06/2021 >>>>>   Subjective: Laying in bed.  Overall feeling better than he did on admission.  States suprapubic lower abdominal pain has improved.  No chest pain.  No shortness of breath.  Appreciative of the care he is receiving  Objective: Vitals:   08/06/21 2048 08/06/21 2352 08/07/21 0437 08/07/21 0825  BP: (!) 165/67 (!) 135/49 (!) 142/59 (!) 144/61  Pulse: 66 (!) 57 (!) 58 65  Resp: 20 19 18  18  Temp: 98.2 F (36.8 C) 98 F (36.7 C) 97.8 F (36.6 C) 98.3 F (36.8 C)  TempSrc: Oral Oral Oral Oral   SpO2: 98% 97% 98% 99%  Weight:      Height:        Intake/Output Summary (Last 24 hours) at 08/07/2021 1238 Last data filed at 08/07/2021 1211 Gross per 24 hour  Intake 2149.61 ml  Output 2625 ml  Net -475.39 ml   Filed Weights   08/05/21 2300 08/05/21 2333  Weight: 83 kg 90.7 kg    Examination:  General exam: Appears calm and comfortable  Respiratory system: Clear to auscultation anterior lung fields. Respiratory effort normal. Cardiovascular system: S1 & S2 heard, RRR. No JVD, murmurs, rubs, gallops or clicks. No pedal edema. Gastrointestinal system: Abdomen is nondistended, soft and nontender. No organomegaly or masses felt. Normal bowel sounds heard. Central nervous system: Alert and oriented. No focal neurological deficits. Extremities: Symmetric 5 x 5 power. Skin: No rashes, lesions or ulcers Psychiatry: Judgement and insight appear normal. Mood & affect appropriate.     Data Reviewed: I have personally reviewed following labs and imaging studies  CBC: Recent Labs  Lab 08/05/21 2341 08/06/21 1000 08/07/21 0038  WBC 8.2 7.1 6.4  NEUTROABS 6.0 4.9  --   HGB 11.6* 10.7* 10.3*  HCT 38.3* 34.5* 33.5*  MCV 83.3 85.2 83.8  PLT 328 256 229    Basic Metabolic Panel: Recent Labs  Lab 08/05/21 2341 08/06/21 0530 08/06/21 1000 08/07/21 0038  NA 138  --  135 136  K 2.8*  --  4.6 3.3*  CL 102  --  105 107  CO2 29  --  21* 23  GLUCOSE 187*  --  177* 200*  BUN <5*  --  6* 6*  CREATININE 0.50*  --  0.47* 0.48*  CALCIUM 9.0  --  8.4* 8.4*  MG  --  1.5*  --  1.8    GFR: Estimated Creatinine Clearance: 86.2 mL/min (A) (by C-G formula based on SCr of 0.48 mg/dL (L)).  Liver Function Tests: Recent Labs  Lab 08/05/21 2341  AST 14*  ALT 13  ALKPHOS 75  BILITOT 0.6  PROT 6.1*  ALBUMIN 2.6*    CBG: Recent Labs  Lab 08/06/21 1248 08/06/21 1844 08/06/21 2144 08/07/21 0823 08/07/21 1208  GLUCAP 163* 167* 158* 235* 238*     Recent Results (from the  past 240 hour(s))  Resp Panel by RT-PCR (Flu A&B, Covid) Nasopharyngeal Swab     Status: None   Collection Time: 08/05/21 11:26 PM   Specimen: Nasopharyngeal Swab; Nasopharyngeal(NP) swabs in vial transport medium  Result Value Ref Range Status   SARS Coronavirus 2 by RT PCR NEGATIVE NEGATIVE Final    Comment: (NOTE) SARS-CoV-2 target nucleic acids are NOT DETECTED.  The SARS-CoV-2 RNA is generally detectable in upper respiratory specimens during the acute phase of infection. The lowest concentration of SARS-CoV-2 viral copies this assay can detect is 138 copies/mL. A negative result does not preclude SARS-Cov-2 infection and should not be used as the sole basis for treatment or other patient management decisions. A negative result may occur with  improper specimen collection/handling, submission of specimen other than nasopharyngeal swab, presence of viral mutation(s) within the areas targeted by this assay, and inadequate number of viral copies(<138 copies/mL). A negative result must be combined with clinical observations, patient history, and epidemiological information. The expected result is Negative.  Fact Sheet for Patients:  EntrepreneurPulse.com.au  Fact Sheet for Healthcare  Providers:  IncredibleEmployment.be  This test is no t yet approved or cleared by the [place] and  has been authorized for detection and/or diagnosis of SARS-CoV-2 by FDA under an Emergency Use Authorization (EUA). This EUA will remain  in effect (meaning this test can be used) for the duration of the COVID-19 declaration under Section 564(b)(1) of the Act, 21 U.S.C.section 360bbb-3(b)(1), unless the authorization is terminated  or revoked sooner.       Influenza A by PCR NEGATIVE NEGATIVE Final   Influenza B by PCR NEGATIVE NEGATIVE Final    Comment: (NOTE) The Xpert Xpress SARS-CoV-2/FLU/RSV plus assay is intended as an aid in the diagnosis of  influenza from Nasopharyngeal swab specimens and should not be used as a sole basis for treatment. Nasal washings and aspirates are unacceptable for Xpert Xpress SARS-CoV-2/FLU/RSV testing.  Fact Sheet for Patients: EntrepreneurPulse.com.au  Fact Sheet for Healthcare Providers: IncredibleEmployment.be  This test is not yet approved or cleared by the [place] FDA and has been authorized for detection and/or diagnosis of SARS-CoV-2 by FDA under an Emergency Use Authorization (EUA). This EUA will remain in effect (meaning this test can be used) for the duration of the COVID-19 declaration under Section 564(b)(1) of the Act, 21 U.S.C. section 360bbb-3(b)(1), unless the authorization is terminated or revoked.  Performed at [facility], [place] [address], [place], [place] [address]   Urine Culture     Status: Abnormal (Preliminary result)   Collection Time: 08/05/21 11:26 PM   Specimen: In/Out Cath Urine  Result Value Ref Range Status   Specimen Description IN/OUT CATH URINE  Final   Special Requests NONE  Final   Culture (A)  Final    70,000 COLONIES/mL PSEUDOMONAS AERUGINOSA SUSCEPTIBILITIES TO FOLLOW Performed at [facility], [place] [address], [place], [place] [address]    Report Status PENDING  Incomplete         Radiology Studies: CT Head Wo Contrast  Result Date: 08/06/2021 CLINICAL DATA:  Headache.  Concern for intracranial hemorrhage. EXAM: CT HEAD WITHOUT CONTRAST TECHNIQUE: Contiguous axial images were obtained from the base of the skull through the vertex without intravenous contrast. COMPARISON:  Head CT dated 03/10/2020. FINDINGS: Brain: Mild age-related atrophy and chronic microvascular ischemic changes. There is no acute intracranial hemorrhage. No mass effect or midline shift. Right posterior fossa probable arachnoid cyst similar to prior CT. Vascular: No hyperdense vessel or unexpected calcification. Skull:  Normal. Negative for fracture or focal lesion. Sinuses/Orbits: No acute finding. Other: None IMPRESSION: 1. No acute intracranial pathology. 2. Mild age-related atrophy and chronic microvascular ischemic changes. Electronically Signed   By: Anner Crete M.D.   On: 08/06/2021 01:31   CT ABDOMEN PELVIS W CONTRAST  Result Date: 08/06/2021 CLINICAL DATA:  Abdominal pain EXAM: CT ABDOMEN AND PELVIS WITH CONTRAST TECHNIQUE: Multidetector CT imaging of the abdomen and pelvis was performed using the standard protocol following bolus administration of intravenous contrast. CONTRAST:  179mL OMNIPAQUE IOHEXOL 300 MG/ML  SOLN COMPARISON:  CT abdomen pelvis dated 12/21/2020. FINDINGS: Lower chest: Trace bilateral pleural effusions with associated partial compressive atelectasis of the lower lobes. There is 3 vessel coronary vascular calcification. Partially visualized small pericardial effusion. No intra-abdominal free air or free fluid. Hepatobiliary: Fatty liver. No intrahepatic biliary dilatation. Cholecystectomy. Pancreas: Unremarkable. No pancreatic ductal dilatation or surrounding inflammatory changes. Spleen: Normal in size without focal abnormality. Adrenals/Urinary Tract: The adrenal glands unremarkable. Similar appearance of cyst in the upper pole of the left  kidney with layering high density material. There is a 2 cm left renal medial interpolar cyst. There is no hydronephrosis on either side. There is symmetric enhancement and excretion of contrast by both kidneys. Abdominal the visualized ureters appear unremarkable. Diffuse thickened and hazy appearance of the bladder wall concerning for cystitis. Correlation with urinalysis recommended. A Foley catheter is noted with balloon in the region of the prostatic urethra. Recommend further advancing of the catheter by additional 7 cm. Stomach/Bowel: Percutaneous gastrostomy with balloon in the body of the stomach. There is severe sigmoid and scattered colonic  diverticulosis without active inflammatory changes. There is a left lower quadrant loop colostomy. There is no bowel obstruction or active inflammation. The appendix is normal. Vascular/Lymphatic: Advanced aortoiliac atherosclerotic disease. The IVC is unremarkable. No portal venous gas. There is no adenopathy. Reproductive: The prostate is grossly unremarkable Other: Of diffuse subcutaneous edema. Musculoskeletal: Osteopenia with degenerative changes of the spine and hips. No acute osseous pathology. IMPRESSION: 1. Foley catheter with balloon in the region of the prostatic urethra. Recommend further advancing of the catheter by additional 7 cm. 2. Severe colonic diverticulosis. No bowel obstruction. Normal appendix. 3. Fatty liver. 4. Trace bilateral pleural effusions with associated partial compressive atelectasis of the lower lobes. 5. Aortic Atherosclerosis (ICD10-I70.0). Electronically Signed   By: Anner Crete M.D.   On: 08/06/2021 01:36   DG Chest Port 1 View  Result Date: 08/05/2021 CLINICAL DATA:  Headache and nausea. EXAM: PORTABLE CHEST 1 VIEW COMPARISON:  December 21, 2020 FINDINGS: The heart size and mediastinal contours are within normal limits. There is moderate severity calcification of the aortic arch. Both lungs are clear. A 3.7 cm x 3.2 cm well-defined opacity is seen overlying the body of the stomach. The visualized skeletal structures are unremarkable. IMPRESSION: No active disease. Electronically Signed   By: Virgina Norfolk M.D.   On: 08/05/2021 23:45        Scheduled Meds:  (feeding supplement) PROSource Plus  30 mL Oral BID BM   acidophilus  1 capsule Oral Daily   vitamin C  500 mg Oral Daily   aspirin EC  81 mg Oral Daily   atorvastatin  20 mg Oral QHS   baclofen  5 mg Oral QHS   Chlorhexidine Gluconate Cloth  6 each Topical Daily   dronabinol  2.5 mg Oral BID   enoxaparin (LOVENOX) injection  40 mg Subcutaneous Q24H   ferrous gluconate  324 mg Oral BID WC    fluticasone  1 spray Each Nare Daily   insulin aspart  0-5 Units Subcutaneous QHS   insulin aspart  0-9 Units Subcutaneous TID WC   insulin glargine-yfgn  24 Units Subcutaneous BID   lidocaine  1 patch Transdermal Q24H   loratadine  10 mg Oral Daily   losartan  50 mg Oral Daily   methenamine  1 g Oral BID   multivitamin with minerals  1 tablet Oral Daily   nutrition supplement (JUVEN)  1 packet Per Tube BID BM   pantoprazole  40 mg Oral BID   polyvinyl alcohol  2 drop Both Eyes Daily   Ensure Max Protein  11 oz Oral Daily   psyllium  1 packet Oral BID   senna-docusate  2 tablet Oral Daily   sertraline  50 mg Oral QHS   simethicone  80 mg Oral BID   sodium chloride flush  3 mL Intravenous Q12H   traZODone  50 mg Oral QHS   [START ON 08/09/2021] vitamin  B-12  5,000 mcg Oral Q Mon   vitamin B-12  500 mcg Oral Daily   Continuous Infusions:  ceFEPime (MAXIPIME) IV 2 g (08/07/21 0904)     LOS: 1 day    Time spent: 35 minutes    Irine Seal, MD [facility]   To contact the attending provider between 7A-7P or the covering provider during after hours 7P-7A, please log into the web site [email or web address] and access using universal Morse password for that web site. If you do not have the password, please call the hospital operator.  08/07/2021, 12:38 PM

## 2021-08-07 NOTE — Progress Notes (Signed)
Initial Nutrition Assessment  DOCUMENTATION CODES:  Not applicable  INTERVENTION:  -d/c Glucerna 1.5 -d/c Prosource TF -Feeding assistance as needed -Recommend liberalizing diet to encourage PO intake -PROSource PLUS PO 65mls BID, each supplement provides 100 kcals and 15 grams of protein -MVI with minerals daily -1 packet Juven BID per tube, each packet provides 95 calories, 2.5 grams of protein (collagen), and 9.8 grams of carbohydrate (3 grams sugar); also contains 7 grams of L-arginine and L-glutamine, 300 mg vitamin C, 15 mg vitamin E, 1.2 mcg vitamin B-12, 9.5 mg zinc, 200 mg calcium, and 1.5 g  Calcium Beta-hydroxy-Beta-methylbutyrate to support wound healing -Ensure Max PO daily, each supplement provides 150 kcal and 30 grams of protein  If PO intake is inadequate, recommend bolus TF via G-tube. Consider 1.5 cartons Jevity 1.5 (331ml) QID w/ 78ml free water before and after each bolus (regimen would provide 2100 kcals, 90 grams protein, 1541ml free water)  NUTRITION DIAGNOSIS:  Increased nutrient needs related to wound healing as evidenced by estimated needs.  GOAL:  Patient will meet greater than or equal to 90% of their needs  MONITOR:  PO intake, Supplement acceptance, Skin, Weight trends, Labs, I & O's  REASON FOR ASSESSMENT:  Consult Assessment of nutrition requirement/status, Enteral/tube feeding initiation and management (MD notes possible home TF)  ASSESSMENT:  Pt admitted with catheter-associated UTI. PMH significant for neuromyelitis optica with paraplegia and neurogenic bladder, CAD, OSA on CPAP, HTN, IDDM, and multiple pressure ulcers.  Per H&P, pt had sigmoid colostomy 2/2 sacral ulcer and incontinence. Discussed pt with RN. Pt presented with 3-4 day h/o nausea without emesis, abdominal pain, and loss of appetite. Pt reports that nausea has improved so far, but also states that eating exacerbated nausea last night has not had breakfast yet so unsure if sx have  truly improved. Pt states that he normally eats a regular diet PO consisting of 3 balanced meals per day. Pt states that meals vary based on what is served, but that he was able to eat 75-100% of meals prior to the onset of these symptoms. Pt states that he has a G-tube, but that it is solely used for the administration of one particular medication and has not been used for feeding in any capacity for several months. Note orders placed on 11/18 for Glucerna 1.5 @ 65ml/hr via G-tube with 80ml Prosource TF BID. RD to d/c these orders given pt w/o issues tolerating PO diet.   Pt aware of increased nutrient needs 2/2 wound healing as he expressed to this RD that the staff at the facility he came from really emphasize his protein intake to promote healing. Pt agreeable to oral nutrition supplements with plan to order bolus TF if PO intake is inadequate if symptoms persist.   Weight history reviewed. Two weights obtained at admission -- 200lbs/90.7kg and 182.98lbs/83kg -- will utilize the latter to estimate needs. Per weight history, pt weighed 90.7kg on 12/24/20, indicating a clinically insignificant 9.4% weight loss x7 months.   No PO Intake documented.    UOP: 1558ml x24 hours Colostomy output: 36ml x24 hours I/O: -158ml since admit  Medications: Scheduled Meds:  acidophilus  1 capsule Oral Daily   vitamin C  500 mg Oral Daily   aspirin EC  81 mg Oral Daily   atorvastatin  20 mg Oral QHS   baclofen  5 mg Oral QHS   Chlorhexidine Gluconate Cloth  6 each Topical Daily   dronabinol  2.5 mg Oral BID  enoxaparin (LOVENOX) injection  40 mg Subcutaneous Q24H   feeding supplement (PROSource TF)  45 mL Per Tube BID   ferrous gluconate  324 mg Oral BID WC   fluticasone  1 spray Each Nare Daily   insulin aspart  0-5 Units Subcutaneous QHS   insulin aspart  0-9 Units Subcutaneous TID WC   insulin glargine-yfgn  24 Units Subcutaneous BID   lidocaine  1 patch Transdermal Q24H   loratadine  10 mg Oral  Daily   losartan  50 mg Oral Daily   methenamine  1 g Oral BID   pantoprazole  40 mg Oral BID   polyvinyl alcohol  2 drop Both Eyes Daily   psyllium  1 packet Oral BID   senna-docusate  2 tablet Oral Daily   sertraline  50 mg Oral QHS   simethicone  80 mg Oral BID   sodium chloride flush  3 mL Intravenous Q12H   traZODone  50 mg Oral QHS   [START ON 08/09/2021] vitamin B-12  5,000 mcg Oral Q Mon   vitamin B-12  500 mcg Oral Daily  Continuous Infusions:  ceFEPime (MAXIPIME) IV 2 g (08/07/21 0904)   feeding supplement (GLUCERNA 1.5 CAL) 75 mL/hr (08/06/21 2313)   magnesium sulfate bolus IVPB 2 g (08/07/21 0957)   Labs: Recent Labs  Lab 08/05/21 2341 08/06/21 0530 08/06/21 1000 08/07/21 0038  NA 138  --  135 136  K 2.8*  --  4.6 3.3*  CL 102  --  105 107  CO2 29  --  21* 23  BUN <5*  --  6* 6*  CREATININE 0.50*  --  0.47* 0.48*  CALCIUM 9.0  --  8.4* 8.4*  MG  --  1.5*  --  1.8  GLUCOSE 187*  --  177* 200*  CBGs: 145-235 x24 hours   NUTRITION - FOCUSED PHYSICAL EXAM: Unable to perform at this time. Will attempt at follow-up.   Diet Order:   Diet Order             Diet heart healthy/carb modified Room service appropriate? Yes; Fluid consistency: Thin  Diet effective now                  EDUCATION NEEDS:  No education needs have been identified at this time  Skin:  Skin Assessment: Skin Integrity Issues: Skin Integrity Issues:: Stage III, Other (Comment) Stage III: R thigh Other: pressure injuries to R tibia and L heel (stage not listed); RN notes injury to sacrum in note on 11/19 but not indicated in nsg skin assessment  Last BM:  11/18 334ml via colostomy  Height:  Ht Readings from Last 1 Encounters:  08/05/21 6' (1.829 m)   Weight:  Wt Readings from Last 10 Encounters:  08/05/21 90.7 kg  03/03/21 83 kg  01/05/21 83.9 kg  12/24/20 90.7 kg  11/22/20 91.6 kg  06/10/20 122.9 kg  03/25/20 108.4 kg  03/25/20 108.6 kg  08/23/19 100.5 kg  08/09/19  100.5 kg   BMI:  Body mass index is 27.12 kg/m.  Estimated Nutritional Needs:  Kcal:  2100-2300 Protein:  105-115 grams Fluid:  >2L     Theone Stanley., MS, RD, LDN (she/her/hers) RD pager number and weekend/on-call pager number located in [place].

## 2021-08-08 DIAGNOSIS — N390 Urinary tract infection, site not specified: Secondary | ICD-10-CM | POA: Diagnosis not present

## 2021-08-08 DIAGNOSIS — T83511A Infection and inflammatory reaction due to indwelling urethral catheter, initial encounter: Secondary | ICD-10-CM | POA: Diagnosis not present

## 2021-08-08 DIAGNOSIS — I2510 Atherosclerotic heart disease of native coronary artery without angina pectoris: Secondary | ICD-10-CM | POA: Diagnosis not present

## 2021-08-08 DIAGNOSIS — E1165 Type 2 diabetes mellitus with hyperglycemia: Secondary | ICD-10-CM | POA: Diagnosis not present

## 2021-08-08 LAB — GLUCOSE, CAPILLARY
Glucose-Capillary: 175 mg/dL — ABNORMAL HIGH (ref 70–99)
Glucose-Capillary: 220 mg/dL — ABNORMAL HIGH (ref 70–99)
Glucose-Capillary: 304 mg/dL — ABNORMAL HIGH (ref 70–99)

## 2021-08-08 LAB — BASIC METABOLIC PANEL
Anion gap: 4 — ABNORMAL LOW (ref 5–15)
BUN: 11 mg/dL (ref 8–23)
CO2: 26 mmol/L (ref 22–32)
Calcium: 8.2 mg/dL — ABNORMAL LOW (ref 8.9–10.3)
Chloride: 105 mmol/L (ref 98–111)
Creatinine, Ser: 0.45 mg/dL — ABNORMAL LOW (ref 0.61–1.24)
GFR, Estimated: >60 mL/min (ref >60)
Glucose, Bld: 281 mg/dL — ABNORMAL HIGH (ref 70–99)
Potassium: 3.9 mmol/L (ref 3.5–5.1)
Sodium: 135 mmol/L (ref 135–145)

## 2021-08-08 LAB — CBC
HCT: 31.9 % — ABNORMAL LOW (ref 39.0–52.0)
Hemoglobin: 9.8 g/dL — ABNORMAL LOW (ref 13.0–17.0)
MCH: 25.8 pg — ABNORMAL LOW (ref 26.0–34.0)
MCHC: 30.7 g/dL (ref 30.0–36.0)
MCV: 83.9 fL (ref 80.0–100.0)
Platelets: 261 10*3/uL (ref 150–400)
RBC: 3.8 MIL/uL — ABNORMAL LOW (ref 4.22–5.81)
RDW: 15.4 % (ref 11.5–15.5)
WBC: 6.1 10*3/uL (ref 4.0–10.5)
nRBC: 0 % (ref 0.0–0.2)

## 2021-08-08 LAB — URINE CULTURE: Culture: 70000 — AB

## 2021-08-08 LAB — MAGNESIUM: Magnesium: 1.9 mg/dL (ref 1.7–2.4)

## 2021-08-08 MED ORDER — AMLODIPINE BESYLATE 5 MG PO TABS
5.0000 mg | ORAL_TABLET | Freq: Every day | ORAL | Status: DC
  Administered 2021-08-08 – 2021-08-10 (×4): 5 mg via ORAL
  Filled 2021-08-08 (×4): qty 1

## 2021-08-08 MED ORDER — DIPHENHYDRAMINE HCL 25 MG PO CAPS
25.0000 mg | ORAL_CAPSULE | Freq: Once | ORAL | Status: AC
  Administered 2021-08-08: 25 mg via ORAL
  Filled 2021-08-08: qty 1

## 2021-08-08 MED ORDER — INSULIN ASPART 100 UNIT/ML IJ SOLN
4.0000 [IU] | Freq: Three times a day (TID) | INTRAMUSCULAR | Status: DC
  Administered 2021-08-09 – 2021-08-10 (×4): 4 [IU] via SUBCUTANEOUS

## 2021-08-08 MED ORDER — HYDRALAZINE HCL 20 MG/ML IJ SOLN
10.0000 mg | Freq: Four times a day (QID) | INTRAMUSCULAR | Status: DC | PRN
  Administered 2021-08-08: 10 mg via INTRAVENOUS
  Filled 2021-08-08: qty 1

## 2021-08-08 NOTE — Progress Notes (Signed)
Pt stated he doesn't want to wear CPAP 

## 2021-08-08 NOTE — Progress Notes (Signed)
Report received and care assumed from previous shift RN. VS obtained, shift assessments completed - see flowsheets. PRN pain and nausea medications given as needed - see MAR. Patient refusing turning and repositioning at times, education provided. Patient c/o itching to b/l UE, neck, and back - X. Blount, NP notified and order obtained for benadryl. Patient states adequate relief. Foley catheter in place, penis edematous and scant amount of bloody drainage noted - foley care provided. L heel, R calf, sacral dressings CDI. G-tube clamped, site cleansed with NS and new drain sponge applied; tube flushed with 30cc sterile water. Ostomy stoma WNL, beefy red, appliance remains intact. Patient currently resting in bed, bed in lowest position. Denies needs. Call bell within reach.

## 2021-08-08 NOTE — Plan of Care (Signed)

## 2021-08-08 NOTE — Progress Notes (Signed)
PROGRESS NOTE    Brent [name].  BZJ:[id number] DOB: [birth year] DOA: 08/05/2021 PCP: Raymondo Band, MD    Chief Complaint  Patient presents with   Headache    Brief Narrative:  Patient 75 year old gentleman history of neuromyelitis optica with paraplegia and neurogenic bladder, CAD, OSA on CPAP, hypertension, insulin-dependent diabetes mellitus, pressure ulcers presented to the ED with nausea, loss of appetite, abdominal pain, 3 to 4 days of nausea with difficulty with oral intake.  Patient with history of sigmoid colostomy created September 2021 due to sacral ulcer and incontinence.  Patient seen in the ED head CT negative, CT abdomen and pelvis with Foley balloon in the prostatic urethra and trace bilateral pleural effusions.  Patient noted on lab work to have a potassium of 2.8, albumin of 2.6, normocytic anemia, normal troponin, urinalysis concerning for UTI.  Patient pancultured.  Patient placed empirically on IV cefepime.   Assessment & Plan:   Principal Problem:   Catheter-associated urinary tract infection (HCC) Active Problems:   Obstructive sleep apnea   Essential hypertension   Paraplegia (HCC)   Coronary artery disease involving native coronary artery of native heart without angina pectoris   Controlled type 2 diabetes mellitus with hyperglycemia, with long-term current use of insulin (HCC)   Neurogenic bladder   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HCC)   Sacral ulcer, with necrosis of muscle (HCC)   Hypokalemia   Complicated UTI (urinary tract infection)  1 catheter associated UTI secondary to Pseudomonas aeruginosa -Patient with chronic Foley catheter presented with nausea, decreased appetite, lower abdominal pain malpositioned Foley catheter urinalysis concerning for UTI. -Urine cultures with 70,000 colonies of Pseudomonas aeruginosa. -Foley catheter replaced in the ED and pancultured. -Improving clinically.   -IV cefepime.   2.  Hypokalemia -Repleted, potassium at  3.9.   -Magnesium at 1.9.   -Repeat labs in the morning.    3.  Insulin-dependent diabetes mellitus -Hemoglobin A1c 7.1 (08/06/2021) -CBG 175 this morning.   -Patient seen by dietitian and diet liberalized as recommended per dietitian. -Continue Semglee 30 units twice daily.   -SSI.   -Start NovoLog meal coverage 4 units 3 times daily with meals.  4.  Sacral wound -No acute infection. -Continue current wound care. -Air mattress overlay.   5.  Neuromyelitis optica/paraplegia -Being followed by neurology in the outpatient setting plan for next course of Rituxan in December. -Supportive care.  6.  Coronary artery disease -Stable. -Lipitor, losartan, aspirin.   7.  HTN -Blood pressure elevated.   -Continue losartan and could uptitrate to 100 mg daily if needed.   -Start Norvasc 5 mg daily.   -IV hydralazine as needed.   8.  Abdominal pain, nausea -Likely secondary to UTI. -Improving clinically.   -CT abdomen and pelvis with no acute intra-abdominal findings.   -IV antibiotics, supportive care.    9.  Headache -Head CT negative. -Resolved after dose of Toradol and Reglan in the ED.  10.  OSA -CPAP nightly.    DVT prophylaxis: Lovenox Code Status: Full Family Communication: Updated patient.  No family at bedside. Disposition:   Status is: Inpatient  Remains inpatient appropriate because: Severity of illness       Consultants:  None  Procedures: CT head 08/06/2021 CT abdomen pelvis 08/06/2022 Chest x-ray 08/05/2021  Antimicrobials: IV cefepime 08/06/2021 >>>>>   Subjective: Laying in bed.  No chest pain.  No shortness of breath.  Lower abdominal suprapubic pain improving.  Tolerating current diet.  Overall feeling better than on admission.  Objective: Vitals:   08/07/21 1804 08/07/21 2100 08/08/21 0417 08/08/21 0802  BP: (!) 155/68 (!) 156/76 (!) 146/56 (!) 178/65  Pulse: 66 69 (!) 53 63  Resp: 17 18 17 18   Temp: 99 F (37.2 C) 99 F (37.2 C)  97.7 F (36.5 C) 97.6 F (36.4 C)  TempSrc: Oral Oral Oral Axillary  SpO2: 98% 100% 97% 99%  Weight:      Height:        Intake/Output Summary (Last 24 hours) at 08/08/2021 1012 Last data filed at 08/08/2021 0600 Gross per 24 hour  Intake 1270 ml  Output 3050 ml  Net -1780 ml    Filed Weights   08/05/21 2300 08/05/21 2333  Weight: 83 kg 90.7 kg    Examination:  General exam: NAD. Respiratory system: Lungs clear to auscultation bilaterally.  No wheezes, no crackles, no rhonchi.  Normal respiratory effort.  Cardiovascular system: Regular rate rhythm no murmurs rubs or gallops.  No JVD.  No lower extremity edema. Gastrointestinal system: Abdomen is soft, nontender, nondistended, positive bowel sounds. Colostomy bag with brown stool noted.  PEG tube site intact. Central nervous system: Alert and oriented. No focal neurological deficits. Extremities: Symmetric 5 x 5 power. Skin: No rashes, lesions or ulcers Psychiatry: Judgement and insight appear normal. Mood & affect appropriate.     Data Reviewed: I have personally reviewed following labs and imaging studies  CBC: Recent Labs  Lab 08/05/21 2341 08/06/21 1000 08/07/21 0038 08/08/21 0104  WBC 8.2 7.1 6.4 6.1  NEUTROABS 6.0 4.9  --   --   HGB 11.6* 10.7* 10.3* 9.8*  HCT 38.3* 34.5* 33.5* 31.9*  MCV 83.3 85.2 83.8 83.9  PLT 328 256 273 261     Basic Metabolic Panel: Recent Labs  Lab 08/05/21 2341 08/06/21 0530 08/06/21 1000 08/07/21 0038 08/08/21 0104  NA 138  --  135 136 135  K 2.8*  --  4.6 3.3* 3.9  CL 102  --  105 107 105  CO2 29  --  21* 23 26  GLUCOSE 187*  --  177* 200* 281*  BUN <5*  --  6* 6* 11  CREATININE 0.50*  --  0.47* 0.48* 0.45*  CALCIUM 9.0  --  8.4* 8.4* 8.2*  MG  --  1.5*  --  1.8 1.9     GFR: Estimated Creatinine Clearance: 86.2 mL/min (A) (by C-G formula based on SCr of 0.45 mg/dL (L)).  Liver Function Tests: Recent Labs  Lab 08/05/21 2341  AST 14*  ALT 13  ALKPHOS 75   BILITOT 0.6  PROT 6.1*  ALBUMIN 2.6*     CBG: Recent Labs  Lab 08/07/21 0823 08/07/21 1208 08/07/21 1800 08/07/21 2146 08/08/21 0814  GLUCAP 235* 238* 217* 291* 175*      Recent Results (from the past 240 hour(s))  Resp Panel by RT-PCR (Flu A&B, Covid) Nasopharyngeal Swab     Status: None   Collection Time: 08/05/21 11:26 PM   Specimen: Nasopharyngeal Swab; Nasopharyngeal(NP) swabs in vial transport medium  Result Value Ref Range Status   SARS Coronavirus 2 by RT PCR NEGATIVE NEGATIVE Final    Comment: (NOTE) SARS-CoV-2 target nucleic acids are NOT DETECTED.  The SARS-CoV-2 RNA is generally detectable in upper respiratory specimens during the acute phase of infection. The lowest concentration of SARS-CoV-2 viral copies this assay can detect is 138 copies/mL. A negative result does not preclude SARS-Cov-2 infection and should not be used as the sole basis for treatment  or other patient management decisions. A negative result may occur with  improper specimen collection/handling, submission of specimen other than nasopharyngeal swab, presence of viral mutation(s) within the areas targeted by this assay, and inadequate number of viral copies(<138 copies/mL). A negative result must be combined with clinical observations, patient history, and epidemiological information. The expected result is Negative.  Fact Sheet for Patients:  EntrepreneurPulse.com.au  Fact Sheet for Healthcare Providers:  IncredibleEmployment.be  This test is no t yet approved or cleared by the [place] FDA and  has been authorized for detection and/or diagnosis of SARS-CoV-2 by FDA under an Emergency Use Authorization (EUA). This EUA will remain  in effect (meaning this test can be used) for the duration of the COVID-19 declaration under Section 564(b)(1) of the Act, 21 U.S.C.section 360bbb-3(b)(1), unless the authorization is terminated  or revoked  sooner.       Influenza A by PCR NEGATIVE NEGATIVE Final   Influenza B by PCR NEGATIVE NEGATIVE Final    Comment: (NOTE) The Xpert Xpress SARS-CoV-2/FLU/RSV plus assay is intended as an aid in the diagnosis of influenza from Nasopharyngeal swab specimens and should not be used as a sole basis for treatment. Nasal washings and aspirates are unacceptable for Xpert Xpress SARS-CoV-2/FLU/RSV testing.  Fact Sheet for Patients: EntrepreneurPulse.com.au  Fact Sheet for Healthcare Providers: IncredibleEmployment.be  This test is not yet approved or cleared by the [place] FDA and has been authorized for detection and/or diagnosis of SARS-CoV-2 by FDA under an Emergency Use Authorization (EUA). This EUA will remain in effect (meaning this test can be used) for the duration of the COVID-19 declaration under Section 564(b)(1) of the Act, 21 U.S.C. section 360bbb-3(b)(1), unless the authorization is terminated or revoked.  Performed at [facility], [place] [address] [place], [place] [address]   Blood Culture (routine x 2)     Status: None (Preliminary result)   Collection Time: 08/05/21 11:26 PM   Specimen: BLOOD  Result Value Ref Range Status   Specimen Description BLOOD RIGHT ANTECUBITAL  Final   Special Requests   Final    BOTTLES DRAWN AEROBIC AND ANAEROBIC Blood Culture adequate volume   Culture   Final    NO GROWTH 1 DAY Performed at [facility], [place] [address], [place],  [address]    Report Status PENDING  Incomplete  Urine Culture     Status: Abnormal   Collection Time: 08/05/21 11:26 PM   Specimen: In/Out Cath Urine  Result Value Ref Range Status   Specimen Description IN/OUT CATH URINE  Final   Special Requests   Final    NONE Performed at [facility], [place] [address], [place], [place] [address]    Culture 70,000 COLONIES/mL PSEUDOMONAS AERUGINOSA (A)  Final   Report Status 08/08/2021 FINAL  Final    Organism ID, Bacteria PSEUDOMONAS AERUGINOSA (A)  Final      Susceptibility   Pseudomonas aeruginosa - MIC*    CEFTAZIDIME 4 SENSITIVE Sensitive     CIPROFLOXACIN 0.5 SENSITIVE Sensitive     GENTAMICIN <=1 SENSITIVE Sensitive     IMIPENEM 1 SENSITIVE Sensitive     PIP/TAZO 8 SENSITIVE Sensitive     CEFEPIME 2 SENSITIVE Sensitive     * 70,000 COLONIES/mL PSEUDOMONAS AERUGINOSA  Blood Culture (routine x 2)     Status: None (Preliminary result)   Collection Time: 08/06/21 12:18 AM   Specimen: BLOOD RIGHT FOREARM  Result Value Ref Range Status   Specimen  Description BLOOD RIGHT FOREARM  Final   Special Requests   Final    BOTTLES DRAWN AEROBIC AND ANAEROBIC Blood Culture results may not be optimal due to an excessive volume of blood received in culture bottles   Culture   Final    NO GROWTH 1 DAY Performed at [place] [address], [place], [place] [address]    Report Status PENDING  Incomplete          Radiology Studies: No results found.      Scheduled Meds:  (feeding supplement) PROSource Plus  30 mL Oral BID BM   acidophilus  1 capsule Oral Daily   amLODipine  5 mg Oral Daily   vitamin C  500 mg Oral Daily   aspirin EC  81 mg Oral Daily   atorvastatin  20 mg Oral QHS   baclofen  5 mg Oral QHS   Chlorhexidine Gluconate Cloth  6 each Topical Daily   dronabinol  2.5 mg Oral BID   enoxaparin (LOVENOX) injection  40 mg Subcutaneous Q24H   ferrous gluconate  324 mg Oral BID WC   fluticasone  1 spray Each Nare Daily   influenza vaccine adjuvanted  0.5 mL Intramuscular Tomorrow-1000   insulin aspart  0-5 Units Subcutaneous QHS   insulin aspart  0-9 Units Subcutaneous TID WC   insulin glargine-yfgn  30 Units Subcutaneous BID   lidocaine  1 patch Transdermal Q24H   loratadine  10 mg Oral Daily   losartan  50 mg Oral Daily   methenamine  1 g Oral BID   multivitamin with minerals  1 tablet Oral Daily   nutrition supplement (JUVEN)  1 packet Per Tube BID BM    pantoprazole  40 mg Oral BID   polyvinyl alcohol  2 drop Both Eyes Daily   Ensure Max Protein  11 oz Oral Daily   psyllium  1 packet Oral BID   senna-docusate  2 tablet Oral Daily   sertraline  50 mg Oral QHS   simethicone  80 mg Oral BID   sodium chloride flush  3 mL Intravenous Q12H   traZODone  50 mg Oral QHS   [START ON 08/09/2021] vitamin B-12  5,000 mcg Oral Q Mon   vitamin B-12  500 mcg Oral Daily   Continuous Infusions:  ceFEPime (MAXIPIME) IV 2 g (08/08/21 0936)     LOS: 2 days    Time spent: 35 minutes    Irine Seal, MD [facility]   To contact the attending provider between 7A-7P or the covering provider during after hours 7P-7A, please log into the web site [email or web address] and access using universal [place] password for that web site. If you do not have the password, please call the hospital operator.  08/08/2021, 10:12 AM

## 2021-08-09 DIAGNOSIS — T83511A Infection and inflammatory reaction due to indwelling urethral catheter, initial encounter: Secondary | ICD-10-CM | POA: Diagnosis not present

## 2021-08-09 DIAGNOSIS — E1165 Type 2 diabetes mellitus with hyperglycemia: Secondary | ICD-10-CM | POA: Diagnosis not present

## 2021-08-09 DIAGNOSIS — I2510 Atherosclerotic heart disease of native coronary artery without angina pectoris: Secondary | ICD-10-CM | POA: Diagnosis not present

## 2021-08-09 DIAGNOSIS — N390 Urinary tract infection, site not specified: Secondary | ICD-10-CM | POA: Diagnosis not present

## 2021-08-09 LAB — BASIC METABOLIC PANEL
Anion gap: 7 (ref 5–15)
BUN: 7 mg/dL — ABNORMAL LOW (ref 8–23)
CO2: 23 mmol/L (ref 22–32)
Calcium: 8.3 mg/dL — ABNORMAL LOW (ref 8.9–10.3)
Chloride: 104 mmol/L (ref 98–111)
Creatinine, Ser: 0.47 mg/dL — ABNORMAL LOW (ref 0.61–1.24)
GFR, Estimated: >60 mL/min (ref >60)
Glucose, Bld: 285 mg/dL — ABNORMAL HIGH (ref 70–99)
Potassium: 3.5 mmol/L (ref 3.5–5.1)
Sodium: 134 mmol/L — ABNORMAL LOW (ref 135–145)

## 2021-08-09 LAB — CBC
HCT: 33.9 % — ABNORMAL LOW (ref 39.0–52.0)
Hemoglobin: 10.6 g/dL — ABNORMAL LOW (ref 13.0–17.0)
MCH: 26 pg (ref 26.0–34.0)
MCHC: 31.3 g/dL (ref 30.0–36.0)
MCV: 83.1 fL (ref 80.0–100.0)
Platelets: 253 10*3/uL (ref 150–400)
RBC: 4.08 MIL/uL — ABNORMAL LOW (ref 4.22–5.81)
RDW: 15.5 % (ref 11.5–15.5)
WBC: 7.9 10*3/uL (ref 4.0–10.5)
nRBC: 0 % (ref 0.0–0.2)

## 2021-08-09 LAB — MAGNESIUM: Magnesium: 1.6 mg/dL — ABNORMAL LOW (ref 1.7–2.4)

## 2021-08-09 LAB — RESP PANEL BY RT-PCR (FLU A&B, COVID) ARPGX2
Influenza A by PCR: NEGATIVE
Influenza B by PCR: NEGATIVE
SARS Coronavirus 2 by RT PCR: NEGATIVE

## 2021-08-09 LAB — GLUCOSE, CAPILLARY
Glucose-Capillary: 135 mg/dL — ABNORMAL HIGH (ref 70–99)
Glucose-Capillary: 195 mg/dL — ABNORMAL HIGH (ref 70–99)

## 2021-08-09 MED ORDER — MAGNESIUM OXIDE -MG SUPPLEMENT 400 (240 MG) MG PO TABS
400.0000 mg | ORAL_TABLET | Freq: Two times a day (BID) | ORAL | Status: DC
  Administered 2021-08-09 – 2021-08-10 (×2): 400 mg via ORAL
  Filled 2021-08-09 (×2): qty 1

## 2021-08-09 NOTE — Progress Notes (Signed)
Patient refused CPAP for the night  

## 2021-08-09 NOTE — NC FL2 (Signed)
[place] LEVEL OF CARE SCREENING TOOL     IDENTIFICATION  Patient Name: Brent [name]. Birthdate: 25-Apr-1946 Sex: male Admission Date (Current Location): 08/05/2021  [facility] and [place] Number:  Herbalist and Address:  [facility], [place] [address], [place], [place] [address]      Provider Number: [id number]  Attending Physician Name and Address:  Flora Lipps, MD  Relative Name and Phone Number:       Current Level of Care: Hospital Recommended Level of Care: [place] Prior Approval Number:    Date Approved/Denied:   PASRR Number: [id number]  Discharge Plan: Other (Comment) (LTC)    Current Diagnoses: Patient Active Problem List   Diagnosis Date Noted   Complicated UTI (urinary tract infection)    Catheter-associated urinary tract infection ([place]) 08/06/2021   Osteomyelitis of pelvic region, acute, right ([place]) 03/24/2021   Right ischial pressure sore 02/10/2021   Hypokalemia    Encounter for monitoring immunomodulating therapy 08/20/2020   High risk medication use 08/20/2020   Malnutrition of moderate degree 06/04/2020   Palliative care by specialist    Goals of care, counseling/discussion    FTT (failure to thrive) in adult 06/01/2020   Abdominal pain    Sacral ulcer, with necrosis of muscle ([place]) 04/09/2020   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[place])    Transverse myelitis ([place])    Labile blood pressure    Labile blood glucose    Neurogenic bladder    Neurogenic bowel    Transaminitis    Controlled type 2 diabetes mellitus with hyperglycemia, with long-term current use of insulin ([place])    Dyslipidemia    Paraplegia ([place])    Coronary artery disease involving native coronary artery of native heart without angina pectoris    Steroid-induced hyperglycemia    Diabetic peripheral neuropathy ([place])    Weakness 03/09/2020   Chest pain 03/08/2020   Right leg numbness    Malignant melanoma of left side of  neck ([place]) 10/25/2018   Facial neuritis 10/25/2018   Myofascial pain syndrome 10/25/2018   Occipital neuralgia of left side 10/25/2018   Male hypogonadism 06/20/2016   Renal lesion 06/20/2016   Insomnia 08/07/2015   Enlarged prostate with lower urinary tract symptoms (LUTS) 11/07/2014   Balanitis 07/30/2012   Bladder neck obstruction 07/30/2012   Prostate nodule 06/18/2012   Recurrent nephrolithiasis 06/18/2012   Benign neoplasm of colon 08/29/2011   DEPRESSION, SITUATIONAL, ACUTE 01/23/2010   Essential hypertension 05/30/2009   Coronary atherosclerosis 05/30/2009   Obstructive sleep apnea 11/28/2008   OBESITY 09/23/2008   ERECTILE DYSFUNCTION 11/26/2007   Well controlled type 2 diabetes mellitus with peripheral circulatory disorder ([place]) 05/09/2007   Hyperlipidemia 05/09/2007   MYOCARDIAL INFARCTION, HX OF 05/09/2007   DIVERTICULOSIS, COLON 05/09/2007    [place] RESPIRATION BLADDER Height & Weight     Self, Time, Situation, Place  Normal Incontinent Weight: 200 lb (90.7 kg) Height:  6' (182.9 cm)  BEHAVIORAL SYMPTOMS/MOOD NEUROLOGICAL BOWEL NUTRITION STATUS      Incontinent Diet  AMBULATORY STATUS COMMUNICATION OF NEEDS Skin       PU Stage and Appropriate Care (stage 4 sacrum, mulitple other wounds)                       Personal Care Assistance Level of Assistance              Functional Limitations Info  SPECIAL CARE FACTORS FREQUENCY                       Contractures Contractures Info: Not present    Additional Factors Info  Code Status, Allergies Code Status Info: full Allergies Info: Levofloxacin   Penicillins           Current Medications (08/09/2021):  This is the current hospital active medication list Current Facility-Administered Medications  Medication Dose Route Frequency Provider Last Rate Last Admin   (feeding supplement) PROSource Plus liquid 30 mL  30 mL Oral BID BM Eugenie Filler, MD   30 mL at  08/07/21 1445   acetaminophen (TYLENOL) tablet 650 mg  650 mg Oral Q6H PRN Vianne Bulls, MD   650 mg at 08/07/21 2219   Or   acetaminophen (TYLENOL) suppository 650 mg  650 mg Rectal Q6H PRN Opyd, Ilene Qua, MD       acidophilus (RISAQUAD) capsule 1 capsule  1 capsule Oral Daily Eugenie Filler, MD   1 capsule at 08/09/21 0958   albuterol (PROVENTIL) (2.5 MG/3ML) 0.083% nebulizer solution 2.5 mg  2.5 mg Nebulization Q6H PRN Opyd, Ilene Qua, MD       amLODipine (NORVASC) tablet 5 mg  5 mg Oral Daily Eugenie Filler, MD   5 mg at 08/09/21 0277   artificial tears (LACRILUBE) ophthalmic ointment   Both Eyes Q4H PRN Opyd, Ilene Qua, MD       ascorbic acid (VITAMIN C) tablet 500 mg  500 mg Oral Daily Eugenie Filler, MD   500 mg at 08/09/21 4128   aspirin EC tablet 81 mg  81 mg Oral Daily Opyd, Ilene Qua, MD   81 mg at 08/09/21 7867   atorvastatin (LIPITOR) tablet 20 mg  20 mg Oral QHS Opyd, Ilene Qua, MD   20 mg at 08/08/21 2151   baclofen (LIORESAL) tablet 5 mg  5 mg Oral QHS Opyd, Ilene Qua, MD   5 mg at 08/08/21 2151   ceFEPIme (MAXIPIME) 2 g in sodium chloride 0.9 % 100 mL IVPB  2 g Intravenous Q12H Opyd, Ilene Qua, MD 200 mL/hr at 08/09/21 0957 2 g at 08/09/21 0957   Chlorhexidine Gluconate Cloth 2 % PADS 6 each  6 each Topical Daily Eugenie Filler, MD   6 each at 08/09/21 1001   dronabinol (MARINOL) capsule 2.5 mg  2.5 mg Oral BID Eugenie Filler, MD   2.5 mg at 08/09/21 0958   enoxaparin (LOVENOX) injection 40 mg  40 mg Subcutaneous Q24H Opyd, Ilene Qua, MD   40 mg at 08/09/21 0959   ferrous gluconate (FERGON) tablet 324 mg  324 mg Oral BID WC Eugenie Filler, MD   324 mg at 08/09/21 1000   fluticasone (FLONASE) 50 MCG/ACT nasal spray 1 spray  1 spray Each Nare Daily Eugenie Filler, MD   1 spray at 08/09/21 0959   hydrALAZINE (APRESOLINE) injection 10 mg  10 mg Intravenous Q6H PRN Eugenie Filler, MD   10 mg at 08/08/21 1803   insulin aspart (novoLOG) injection 0-5  Units  0-5 Units Subcutaneous QHS Vianne Bulls, MD   5 Units at 08/08/21 2211   insulin aspart (novoLOG) injection 0-9 Units  0-9 Units Subcutaneous TID WC Opyd, Ilene Qua, MD   1 Units at 08/09/21 1328   insulin aspart (novoLOG) injection 4 Units  4 Units Subcutaneous TID WC Eugenie Filler, MD   4 Units  at 08/09/21 1329   insulin glargine-yfgn (SEMGLEE) injection 30 Units  30 Units Subcutaneous BID Eugenie Filler, MD   30 Units at 08/09/21 1000   lidocaine (LIDODERM) 5 % 1 patch  1 patch Transdermal Q24H Opyd, Ilene Qua, MD   1 patch at 08/09/21 1000   loratadine (CLARITIN) tablet 10 mg  10 mg Oral Daily Eugenie Filler, MD   10 mg at 08/09/21 4970   losartan (COZAAR) tablet 50 mg  50 mg Oral Daily Opyd, Ilene Qua, MD   50 mg at 08/09/21 2637   magnesium hydroxide (MILK OF MAGNESIA) suspension 30 mL  30 mL Oral Daily PRN Eugenie Filler, MD       methenamine (MANDELAMINE) tablet 1 g  1 g Oral BID Eugenie Filler, MD   1 g at 08/09/21 8588   multivitamin with minerals tablet 1 tablet  1 tablet Oral Daily Eugenie Filler, MD   1 tablet at 08/09/21 5027   nutrition supplement (JUVEN) (JUVEN) powder packet 1 packet  1 packet Per Tube BID BM Eugenie Filler, MD   1 packet at 08/08/21 0938   ondansetron (ZOFRAN) tablet 4 mg  4 mg Oral Q6H PRN Opyd, Ilene Qua, MD       Or   ondansetron (ZOFRAN) injection 4 mg  4 mg Intravenous Q6H PRN Opyd, Ilene Qua, MD   4 mg at 08/07/21 2024   pantoprazole (PROTONIX) EC tablet 40 mg  40 mg Oral BID Vianne Bulls, MD   40 mg at 08/09/21 7412   polyvinyl alcohol (LIQUIFILM TEARS) 1.4 % ophthalmic solution 2 drop  2 drop Both Eyes Daily Eugenie Filler, MD   2 drop at 08/09/21 8786   protein supplement (ENSURE MAX) liquid  11 oz Oral Daily Eugenie Filler, MD   11 oz at 08/08/21 2152   psyllium (HYDROCIL/METAMUCIL) 1 packet  1 packet Oral BID Eugenie Filler, MD   1 packet at 08/07/21 7672   senna-docusate (Senokot-S) tablet 1 tablet   1 tablet Oral QHS PRN Opyd, Ilene Qua, MD       senna-docusate (Senokot-S) tablet 2 tablet  2 tablet Oral Daily Eugenie Filler, MD   2 tablet at 08/09/21 0947   sertraline (ZOLOFT) tablet 50 mg  50 mg Oral QHS Eugenie Filler, MD   50 mg at 08/08/21 2151   simethicone (MYLICON) chewable tablet 80 mg  80 mg Oral BID Eugenie Filler, MD   80 mg at 08/09/21 0958   sodium chloride flush (NS) 0.9 % injection 3 mL  3 mL Intravenous Q12H Opyd, Ilene Qua, MD   3 mL at 08/09/21 1008   traMADol (ULTRAM) tablet 50 mg  50 mg Oral BID PRN Vianne Bulls, MD   50 mg at 08/08/21 2223   traZODone (DESYREL) tablet 50 mg  50 mg Oral QHS Vianne Bulls, MD   50 mg at 08/08/21 2151   vitamin B-12 (CYANOCOBALAMIN) tablet 5,000 mcg  5,000 mcg Oral Q Delana Meyer, MD   5,000 mcg at 08/09/21 0957   vitamin B-12 (CYANOCOBALAMIN) tablet 500 mcg  500 mcg Oral Daily Eugenie Filler, MD   500 mcg at 08/08/21 0962     Discharge Medications: Please see discharge summary for a list of discharge medications.  Relevant Imaging Results:  Relevant Lab Results:   Additional Information SS#: [place], [place]

## 2021-08-09 NOTE — Progress Notes (Signed)
Pharmacy Antibiotic Note  Brent [name]. is a 75 y.o. male admitted on 08/05/2021 with UTI.  Pharmacy has been consulted for Cefepime dosing.  ID: UTI . Afebrile, WBC WNL. Scr <1 - found to have malpositioned catheter in ED  Cefepime 11/18 >>   11/17 bcx: ngtd 11/17 ucx: Pseudomonas aeruginosa 70k colonies   Plan: Continue cefepime 2g q12h UTI dosing - if worsens change to 2g q8h Pharmacy will sign off. Please reconsult for further dosing assitance.    Height: 6' (182.9 cm) Weight: 90.7 kg (200 lb) IBW/kg (Calculated) : 77.6  Temp (24hrs), Avg:98.3 F (36.8 C), Min:97.6 F (36.4 C), Max:98.8 F (37.1 C)  Recent Labs  Lab 08/05/21 2341 08/06/21 0141 08/06/21 1000 08/07/21 0038 08/08/21 0104 08/09/21 0200  WBC 8.2  --  7.1 6.4 6.1 7.9  CREATININE 0.50*  --  0.47* 0.48* 0.45* 0.47*  LATICACIDVEN 1.0 1.1  --   --   --   --     Estimated Creatinine Clearance: 86.2 mL/min (A) (by C-G formula based on SCr of 0.47 mg/dL (L)).    Allergies  Allergen Reactions   Levofloxacin Rash   Penicillins Hives and Rash    Tolerated Unasyn, Augmentin, cefepime and cephalexin    Brent [name], PharmD, BCPS Clinical Staff Pharmacist [email or web address]  Brent [name] 08/09/2021 7:35 AM

## 2021-08-09 NOTE — Progress Notes (Signed)
PROGRESS NOTE    Brent [name].  WGY:[id number] DOB: 1946-07-06 DOA: 08/05/2021 PCP: Raymondo Band, MD   Brief Narrative:  Patient 75 year old gentleman history of neuromyelitis optica with paraplegia and neurogenic bladder, CAD, OSA on CPAP, hypertension, insulin-dependent diabetes mellitus, pressure ulcers presented to the ED with nausea, loss of appetite, abdominal pain, 3 to 4 days of nausea with difficulty with oral intake.  Patient with history of sigmoid colostomy created September 2021 due to sacral ulcer and incontinence.  Patient seen in the ED head CT negative, CT abdomen and pelvis with Foley balloon in the prostatic urethra and trace bilateral pleural effusions.  Patient noted on lab work to have a potassium of 2.8, albumin of 2.6, normocytic anemia, normal troponin, urinalysis concerning for UTI.  Patient pancultured.  Patient placed empirically on IV cefepime.   Assessment & Plan:   Principal Problem:   Catheter-associated urinary tract infection (HCC) Active Problems:   Obstructive sleep apnea   Essential hypertension   Paraplegia (HCC)   Coronary artery disease involving native coronary artery of native heart without angina pectoris   Controlled type 2 diabetes mellitus with hyperglycemia, with long-term current use of insulin (HCC)   Neurogenic bladder   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HCC)   Sacral ulcer, with necrosis of muscle (HCC)   Hypokalemia   Complicated UTI (urinary tract infection)  Catheter associated UTI secondary to Pseudomonas aeruginosa Patient with chronic Foley catheter in place.  Urine culture with more than 70,000 colonies of Pseudomonas.  On IV cefepime.  We will continue with that.  Blood cultures negative in 3 days.  Hypokalemia Potassium level of 3.5.  Magnesium 1.6.  Will replace magnesium.  Hypomagnesemia.  We will replenish.  Insulin-dependent diabetes mellitus Hemoglobin A1c of 7.1 from 08/06/2021.  Continue Semglee.  NovoLog.  Monitor  blood glucose levels.  Latest POC glucose of 135.  Neuromyelitis optica/paraplegia Supportive care.  Patient has been followed by neurology as outpatient.  Plan for next dose of Rituxan in December.  Coronary artery disease Continue Lipitor, losartan and aspirin.  Essential hypertension Continue losartan Norvasc.  Blood pressure seems to be stable at this time.  Abdominal pain, nausea Secondary to UTI.  CT scan of the abdomen and pelvis without any acute findings.  Continue IV antibiotic.  OSA Continue CPAP at nighttime.  Stage IV sacral ulceration present on admission.  Left heel deep tissue injury.  Continue wound care.   DVT prophylaxis: Lovenox  Code Status: Full code  Family Communication:  For the patient at bedside.  Disposition:   Status is: Inpatient  Remains inpatient appropriate because: Severity of illness, IV antibiotic,    Consultants:  None  Procedures: CT head 08/06/2021 CT abdomen pelvis 08/06/2022 Chest x-ray 08/05/2021  Antimicrobials: IV cefepime 08/06/2021 >  Subjective: Today, patient was seen and examined at bedside.  Patient overall feels okay.  Denies any nausea, vomiting, fever chills.  Objective: Vitals:   08/08/21 1848 08/08/21 2143 08/09/21 0511 08/09/21 0757  BP: (!) 158/60 (!) 163/73 (!) 124/50 (!) 121/49  Pulse: 82 81 65 63  Resp:  18 19   Temp:  98.8 F (37.1 C) 98.1 F (36.7 C) 98.4 F (36.9 C)  TempSrc:  Oral Oral Oral  SpO2:  100% 98% 99%  Weight:      Height:        Intake/Output Summary (Last 24 hours) at 08/09/2021 1444 Last data filed at 08/09/2021 1028 Gross per 24 hour  Intake 718.09 ml  Output 3675  ml  Net -2956.91 ml    Filed Weights   08/05/21 2300 08/05/21 2333  Weight: 83 kg 90.7 kg    Physical examination: General:  Average built, not in obvious distress HENT:   No scleral pallor or icterus noted. Oral mucosa is moist.  Chest:  Clear breath sounds.  Diminished breath sounds bilaterally. No  crackles or wheezes.  CVS: S1 &S2 heard. No murmur.  Regular rate and rhythm. Abdomen: Soft, nontender, nondistended.  Bowel sounds are heard.  Colostomy bag in place.  PEG tube site intact.  Urinary catheter in place. Extremities: No cyanosis, clubbing or edema.  Peripheral pulses are palpable. Psych: Alert, awake and oriented, normal mood CNS: Unable to move lower extremities. Skin: Warm and dry.  No rashes noted.   Data Reviewed: I have personally reviewed the following labs and imaging studies.  CBC: Recent Labs  Lab 08/05/21 2341 08/06/21 1000 08/07/21 0038 08/08/21 0104 08/09/21 0200  WBC 8.2 7.1 6.4 6.1 7.9  NEUTROABS 6.0 4.9  --   --   --   HGB 11.6* 10.7* 10.3* 9.8* 10.6*  HCT 38.3* 34.5* 33.5* 31.9* 33.9*  MCV 83.3 85.2 83.8 83.9 83.1  PLT 328 256 273 261 253     Basic Metabolic Panel: Recent Labs  Lab 08/05/21 2341 08/06/21 0530 08/06/21 1000 08/07/21 0038 08/08/21 0104 08/09/21 0200  NA 138  --  135 136 135 134*  K 2.8*  --  4.6 3.3* 3.9 3.5  CL 102  --  105 107 105 104  CO2 29  --  21* 23 26 23   GLUCOSE 187*  --  177* 200* 281* 285*  BUN <5*  --  6* 6* 11 7*  CREATININE 0.50*  --  0.47* 0.48* 0.45* 0.47*  CALCIUM 9.0  --  8.4* 8.4* 8.2* 8.3*  MG  --  1.5*  --  1.8 1.9 1.6*    GFR: Estimated Creatinine Clearance: 86.2 mL/min (A) (by C-G formula based on SCr of 0.47 mg/dL (L)).  Liver Function Tests: Recent Labs  Lab 08/05/21 2341  AST 14*  ALT 13  ALKPHOS 75  BILITOT 0.6  PROT 6.1*  ALBUMIN 2.6*     CBG: Recent Labs  Lab 08/07/21 2146 08/08/21 0814 08/08/21 1151 08/08/21 1549 08/09/21 1108  GLUCAP 291* 175* 220* 304* 135*     Recent Results (from the past 240 hour(s))  Resp Panel by RT-PCR (Flu A&B, Covid) Nasopharyngeal Swab     Status: None   Collection Time: 08/05/21 11:26 PM   Specimen: Nasopharyngeal Swab; Nasopharyngeal(NP) swabs in vial transport medium  Result Value Ref Range Status   SARS Coronavirus 2 by RT PCR  NEGATIVE NEGATIVE Final    Comment: (NOTE) SARS-CoV-2 target nucleic acids are NOT DETECTED.  The SARS-CoV-2 RNA is generally detectable in upper respiratory specimens during the acute phase of infection. The lowest concentration of SARS-CoV-2 viral copies this assay can detect is 138 copies/mL. A negative result does not preclude SARS-Cov-2 infection and should not be used as the sole basis for treatment or other patient management decisions. A negative result may occur with  improper specimen collection/handling, submission of specimen other than nasopharyngeal swab, presence of viral mutation(s) within the areas targeted by this assay, and inadequate number of viral copies(<138 copies/mL). A negative result must be combined with clinical observations, patient history, and epidemiological information. The expected result is Negative.  Fact Sheet for Patients:  EntrepreneurPulse.com.au  Fact Sheet for Healthcare Providers:  IncredibleEmployment.be  This  test is no t yet approved or cleared by the [place] and  has been authorized for detection and/or diagnosis of SARS-CoV-2 by FDA under an Emergency Use Authorization (EUA). This EUA will remain  in effect (meaning this test can be used) for the duration of the COVID-19 declaration under Section 564(b)(1) of the Act, 21 U.S.C.section 360bbb-3(b)(1), unless the authorization is terminated  or revoked sooner.       Influenza A by PCR NEGATIVE NEGATIVE Final   Influenza B by PCR NEGATIVE NEGATIVE Final    Comment: (NOTE) The Xpert Xpress SARS-CoV-2/FLU/RSV plus assay is intended as an aid in the diagnosis of influenza from Nasopharyngeal swab specimens and should not be used as a sole basis for treatment. Nasal washings and aspirates are unacceptable for Xpert Xpress SARS-CoV-2/FLU/RSV testing.  Fact Sheet for Patients: EntrepreneurPulse.com.au  Fact Sheet for  Healthcare Providers: IncredibleEmployment.be  This test is not yet approved or cleared by the [place] FDA and has been authorized for detection and/or diagnosis of SARS-CoV-2 by FDA under an Emergency Use Authorization (EUA). This EUA will remain in effect (meaning this test can be used) for the duration of the COVID-19 declaration under Section 564(b)(1) of the Act, 21 U.S.C. section 360bbb-3(b)(1), unless the authorization is terminated or revoked.  Performed at [facility], [place] [address] [place], [place] [address]   Blood Culture (routine x 2)     Status: None (Preliminary result)   Collection Time: 08/05/21 11:26 PM   Specimen: BLOOD  Result Value Ref Range Status   Specimen Description BLOOD RIGHT ANTECUBITAL  Final   Special Requests   Final    BOTTLES DRAWN AEROBIC AND ANAEROBIC Blood Culture adequate volume   Culture   Final    NO GROWTH 2 DAYS Performed at [facility], [place] [address] [place], [place] [address]    Report Status PENDING  Incomplete  Urine Culture     Status: Abnormal   Collection Time: 08/05/21 11:26 PM   Specimen: In/Out Cath Urine  Result Value Ref Range Status   Specimen Description IN/OUT CATH URINE  Final   Special Requests   Final    NONE Performed at [facility], [place] [address], [place], [place] [address]    Culture 70,000 COLONIES/mL PSEUDOMONAS AERUGINOSA (A)  Final   Report Status 08/08/2021 FINAL  Final   Organism ID, Bacteria PSEUDOMONAS AERUGINOSA (A)  Final      Susceptibility   Pseudomonas aeruginosa - MIC*    CEFTAZIDIME 4 SENSITIVE Sensitive     CIPROFLOXACIN 0.5 SENSITIVE Sensitive     GENTAMICIN <=1 SENSITIVE Sensitive     IMIPENEM 1 SENSITIVE Sensitive     PIP/TAZO 8 SENSITIVE Sensitive     CEFEPIME 2 SENSITIVE Sensitive     * 70,000 COLONIES/mL PSEUDOMONAS AERUGINOSA  Blood Culture (routine x 2)     Status: None (Preliminary result)   Collection Time: 08/06/21 12:18 AM    Specimen: BLOOD RIGHT FOREARM  Result Value Ref Range Status   Specimen Description BLOOD RIGHT FOREARM  Final   Special Requests   Final    BOTTLES DRAWN AEROBIC AND ANAEROBIC Blood Culture results may not be optimal due to an excessive volume of blood received in culture bottles   Culture   Final    NO GROWTH 2 DAYS Performed at [facility], [place] [address], [place], [place] [address]    Report Status PENDING  Incomplete  Radiology Studies: No results found.  Scheduled Meds:  (feeding supplement) PROSource Plus  30 mL Oral BID BM   acidophilus  1 capsule Oral Daily   amLODipine  5 mg Oral Daily   vitamin C  500 mg Oral Daily   aspirin EC  81 mg Oral Daily   atorvastatin  20 mg Oral QHS   baclofen  5 mg Oral QHS   Chlorhexidine Gluconate Cloth  6 each Topical Daily   dronabinol  2.5 mg Oral BID   enoxaparin (LOVENOX) injection  40 mg Subcutaneous Q24H   ferrous gluconate  324 mg Oral BID WC   fluticasone  1 spray Each Nare Daily   insulin aspart  0-5 Units Subcutaneous QHS   insulin aspart  0-9 Units Subcutaneous TID WC   insulin aspart  4 Units Subcutaneous TID WC   insulin glargine-yfgn  30 Units Subcutaneous BID   lidocaine  1 patch Transdermal Q24H   loratadine  10 mg Oral Daily   losartan  50 mg Oral Daily   methenamine  1 g Oral BID   multivitamin with minerals  1 tablet Oral Daily   nutrition supplement (JUVEN)  1 packet Per Tube BID BM   pantoprazole  40 mg Oral BID   polyvinyl alcohol  2 drop Both Eyes Daily   Ensure Max Protein  11 oz Oral Daily   psyllium  1 packet Oral BID   senna-docusate  2 tablet Oral Daily   sertraline  50 mg Oral QHS   simethicone  80 mg Oral BID   sodium chloride flush  3 mL Intravenous Q12H   traZODone  50 mg Oral QHS   vitamin B-12  5,000 mcg Oral Q Mon   vitamin B-12  500 mcg Oral Daily   Continuous Infusions:  ceFEPime (MAXIPIME) IV 2 g (08/09/21 0957)     LOS: 3 days    Flora Lipps, MD [facility] 08/09/2021, 2:44 PM

## 2021-08-09 NOTE — Care Management Important Message (Signed)
Important Message  Patient Details  Name: Brent [name]. MRN: [id number] Date of Birth: May 06, 1946   Medicare Important Message Given:  Yes     Joetta Manners 08/09/2021, 1:48 PM

## 2021-08-09 NOTE — Consult Note (Addendum)
WOC Nurse Consult Note: Reason for Consult: Consult requested for left heel, right leg, sacrum and right ischium.  Pt is familiar to [facility] team from previous admission.  Sacrum with chronic Stage 4 pressure injury; 100% red and moist, mod amt tan drainage, 6X8X.2cm Right ischium with chronic Stage 4 pressure injury to ischium; 3X1X.3cm, red and moist with mod amt tan drainage Left heel with dark purple deep tissue injury; 1.5X1.5cm Right posterior leg with healing full thickness wounds in patchy areas; ref and moist 5X3X.2cm, small amt pink drainage.  Pressure Injury POA: Yes Dressing procedure/placement/frequency: Float heels to reduce pressure. Topical treatment orders provided for bedside nurses to perform as follows: 1. Apply Aquacel Kellie Simmering # [phone]) to right ischium and sacrum wounds Q day, then cover with foam dressing.  (Change foam dressings Q 3 days or PRN soiling.) 2. Foam dressing to left heel and right posterior leg, change Q 3 days or PRN soiling.  Ostomy: Pt has a colostomy pouch which is intact with good seal; extra supplies: 2 piece pouch and barrier ring left in the room for staff nurses use PRN.  Use Supplies: barrier ring, # [id number], wafer #2, pouch # 916   Please re-consult if further assistance is needed.  Thank-you,  Julien Girt MSN, [place], [place], [place], [place]

## 2021-08-09 NOTE — TOC Initial Note (Signed)
Transition of Care (TOC) - Initial/Assessment Note    Patient Details  Name: Brent [name]. MRN: [id number] Date of Birth: Jul 19, 1946  Transition of Care [facility]) CM/SW Contact:    Emeterio Reeve, LCSW Phone Number: 08/09/2021, 3:07 PM  Clinical Narrative:                 Pt is from LTC at [facility]. The plan is to return to [facility] at discharge.   Expected Discharge Plan: Long Term Nursing Home Barriers to Discharge: Continued Medical Work up   Patient Goals and CMS Choice        Expected Discharge Plan and Services Expected Discharge Plan: [place]       Living arrangements for the past 2 months: [place]                                      Prior Living Arrangements/Services Living arrangements for the past 2 months: [place] Lives with:: Facility Resident Patient language and need for interpreter reviewed:: Yes Do you feel safe going back to the place where you live?: Yes      Need for Family Participation in Patient Care: Yes (Comment) Care giver support system in place?: Yes (comment)   Criminal Activity/Legal Involvement Pertinent to Current Situation/Hospitalization: No - Comment as needed  Activities of Daily Living Home Assistive Devices/Equipment: Wheelchair ADL Screening (condition at time of admission) Patient's cognitive ability adequate to safely complete daily activities?: Yes Is the patient deaf or have difficulty hearing?: No Does the patient have difficulty seeing, even when wearing glasses/contacts?: No Does the patient have difficulty concentrating, remembering, or making decisions?: No Patient able to express need for assistance with ADLs?: Yes Does the patient have difficulty dressing or bathing?: Yes Independently performs ADLs?: No Communication: Needs assistance Does the patient have difficulty walking or climbing stairs?: Yes Weakness of Legs: Both Weakness of Arms/Hands:  Both  Permission Sought/Granted Permission sought to share information with : Case Manager Permission granted to share information with : Yes, Verbal Permission Granted     Permission granted to share info w AGENCY: [facility]        Emotional Assessment Appearance:: Appears stated age   Affect (typically observed): Unable to Assess Orientation: : Oriented to Self, Oriented to Place, Oriented to  Time, Oriented to Situation Alcohol / Substance Use: Not Applicable Psych Involvement: No (comment)  Admission diagnosis:  Complicated UTI (urinary tract infection) [N39.0] Catheter-associated urinary tract infection ([place]) [G81.856D, N39.0] Patient Active Problem List   Diagnosis Date Noted   Complicated UTI (urinary tract infection)    Catheter-associated urinary tract infection ([place]) 08/06/2021   Osteomyelitis of pelvic region, acute, right ([place]) 03/24/2021   Right ischial pressure sore 02/10/2021   Hypokalemia    Encounter for monitoring immunomodulating therapy 08/20/2020   High risk medication use 08/20/2020   Malnutrition of moderate degree 06/04/2020   Palliative care by specialist    Goals of care, counseling/discussion    FTT (failure to thrive) in adult 06/01/2020   Abdominal pain    Sacral ulcer, with necrosis of muscle (HCC) 04/09/2020   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HCC)    Transverse myelitis (HCC)    Labile blood pressure    Labile blood glucose    Neurogenic bladder    Neurogenic bowel    Transaminitis    Controlled type 2 diabetes mellitus with  hyperglycemia, with long-term current use of insulin ([place])    Dyslipidemia    Paraplegia ([place])    Coronary artery disease involving native coronary artery of native heart without angina pectoris    Steroid-induced hyperglycemia    Diabetic peripheral neuropathy ([place])    Weakness 03/09/2020   Chest pain 03/08/2020   Right leg numbness    Malignant melanoma of left side of neck (HCC) 10/25/2018   Facial neuritis 10/25/2018    Myofascial pain syndrome 10/25/2018   Occipital neuralgia of left side 10/25/2018   Male hypogonadism 06/20/2016   Renal lesion 06/20/2016   Insomnia 08/07/2015   Enlarged prostate with lower urinary tract symptoms (LUTS) 11/07/2014   Balanitis 07/30/2012   Bladder neck obstruction 07/30/2012   Prostate nodule 06/18/2012   Recurrent nephrolithiasis 06/18/2012   Benign neoplasm of colon 08/29/2011   DEPRESSION, SITUATIONAL, ACUTE 01/23/2010   Essential hypertension 05/30/2009   Coronary atherosclerosis 05/30/2009   Obstructive sleep apnea 11/28/2008   OBESITY 09/23/2008   ERECTILE DYSFUNCTION 11/26/2007   Well controlled type 2 diabetes mellitus with peripheral circulatory disorder ([place]) 05/09/2007   Hyperlipidemia 05/09/2007   MYOCARDIAL INFARCTION, HX OF 05/09/2007   DIVERTICULOSIS, COLON 05/09/2007   PCP:  Raymondo Band, MD Pharmacy:  No Pharmacies Listed    Social Determinants of Health (SDOH) Interventions    Readmission Risk Interventions Readmission Risk Prevention Plan 06/08/2020  Transportation Screening Complete  Medication Review (RN Care Manager) Referral to Pharmacy  PCP or Specialist appointment within 3-5 days of discharge Not Complete  PCP/Specialist Appt Not Complete comments SNF resident  [facility] or [place] Complete  SW Recovery Care/Counseling Consult Complete  Palliative Care Screening Complete  [place] Complete  Some recent data might be hidden    Emeterio Reeve, LCSW Clinical Social Worker

## 2021-08-10 DIAGNOSIS — T83511A Infection and inflammatory reaction due to indwelling urethral catheter, initial encounter: Secondary | ICD-10-CM | POA: Diagnosis not present

## 2021-08-10 DIAGNOSIS — D508 Other iron deficiency anemias: Secondary | ICD-10-CM | POA: Diagnosis not present

## 2021-08-10 DIAGNOSIS — M359 Systemic involvement of connective tissue, unspecified: Secondary | ICD-10-CM | POA: Diagnosis not present

## 2021-08-10 DIAGNOSIS — L089 Local infection of the skin and subcutaneous tissue, unspecified: Secondary | ICD-10-CM | POA: Diagnosis not present

## 2021-08-10 DIAGNOSIS — G8220 Paraplegia, unspecified: Secondary | ICD-10-CM | POA: Diagnosis not present

## 2021-08-10 DIAGNOSIS — R6520 Severe sepsis without septic shock: Secondary | ICD-10-CM | POA: Diagnosis not present

## 2021-08-10 DIAGNOSIS — Z743 Need for continuous supervision: Secondary | ICD-10-CM | POA: Diagnosis not present

## 2021-08-10 DIAGNOSIS — R278 Other lack of coordination: Secondary | ICD-10-CM | POA: Diagnosis not present

## 2021-08-10 DIAGNOSIS — I10 Essential (primary) hypertension: Secondary | ICD-10-CM | POA: Diagnosis not present

## 2021-08-10 DIAGNOSIS — E870 Hyperosmolality and hypernatremia: Secondary | ICD-10-CM | POA: Diagnosis not present

## 2021-08-10 DIAGNOSIS — K9419 Other complications of enterostomy: Secondary | ICD-10-CM | POA: Diagnosis not present

## 2021-08-10 DIAGNOSIS — E1165 Type 2 diabetes mellitus with hyperglycemia: Secondary | ICD-10-CM | POA: Diagnosis not present

## 2021-08-10 DIAGNOSIS — Z23 Encounter for immunization: Secondary | ICD-10-CM | POA: Diagnosis not present

## 2021-08-10 DIAGNOSIS — M4628 Osteomyelitis of vertebra, sacral and sacrococcygeal region: Secondary | ICD-10-CM | POA: Diagnosis not present

## 2021-08-10 DIAGNOSIS — E1159 Type 2 diabetes mellitus with other circulatory complications: Secondary | ICD-10-CM | POA: Diagnosis not present

## 2021-08-10 DIAGNOSIS — E876 Hypokalemia: Secondary | ICD-10-CM | POA: Diagnosis not present

## 2021-08-10 DIAGNOSIS — Y738 Miscellaneous gastroenterology and urology devices associated with adverse incidents, not elsewhere classified: Secondary | ICD-10-CM | POA: Diagnosis not present

## 2021-08-10 DIAGNOSIS — Z7401 Bed confinement status: Secondary | ICD-10-CM | POA: Diagnosis not present

## 2021-08-10 DIAGNOSIS — D509 Iron deficiency anemia, unspecified: Secondary | ICD-10-CM | POA: Diagnosis not present

## 2021-08-10 DIAGNOSIS — Y846 Urinary catheterization as the cause of abnormal reaction of the patient, or of later complication, without mention of misadventure at the time of the procedure: Secondary | ICD-10-CM | POA: Diagnosis not present

## 2021-08-10 DIAGNOSIS — R2681 Unsteadiness on feet: Secondary | ICD-10-CM | POA: Diagnosis not present

## 2021-08-10 DIAGNOSIS — M869 Osteomyelitis, unspecified: Secondary | ICD-10-CM | POA: Diagnosis not present

## 2021-08-10 DIAGNOSIS — N390 Urinary tract infection, site not specified: Secondary | ICD-10-CM | POA: Diagnosis not present

## 2021-08-10 DIAGNOSIS — N319 Neuromuscular dysfunction of bladder, unspecified: Secondary | ICD-10-CM | POA: Diagnosis not present

## 2021-08-10 DIAGNOSIS — R6889 Other general symptoms and signs: Secondary | ICD-10-CM | POA: Diagnosis not present

## 2021-08-10 DIAGNOSIS — Z7409 Other reduced mobility: Secondary | ICD-10-CM | POA: Diagnosis not present

## 2021-08-10 DIAGNOSIS — A419 Sepsis, unspecified organism: Secondary | ICD-10-CM | POA: Diagnosis not present

## 2021-08-10 DIAGNOSIS — I2510 Atherosclerotic heart disease of native coronary artery without angina pectoris: Secondary | ICD-10-CM | POA: Diagnosis not present

## 2021-08-10 DIAGNOSIS — R109 Unspecified abdominal pain: Secondary | ICD-10-CM | POA: Diagnosis not present

## 2021-08-10 DIAGNOSIS — R110 Nausea: Secondary | ICD-10-CM | POA: Diagnosis not present

## 2021-08-10 DIAGNOSIS — Z931 Gastrostomy status: Secondary | ICD-10-CM | POA: Diagnosis not present

## 2021-08-10 DIAGNOSIS — B965 Pseudomonas (aeruginosa) (mallei) (pseudomallei) as the cause of diseases classified elsewhere: Secondary | ICD-10-CM | POA: Diagnosis not present

## 2021-08-10 DIAGNOSIS — G8250 Quadriplegia, unspecified: Secondary | ICD-10-CM | POA: Diagnosis not present

## 2021-08-10 DIAGNOSIS — L98423 Non-pressure chronic ulcer of back with necrosis of muscle: Secondary | ICD-10-CM | POA: Diagnosis not present

## 2021-08-10 DIAGNOSIS — F331 Major depressive disorder, recurrent, moderate: Secondary | ICD-10-CM | POA: Diagnosis not present

## 2021-08-10 DIAGNOSIS — M6259 Muscle wasting and atrophy, not elsewhere classified, multiple sites: Secondary | ICD-10-CM | POA: Diagnosis not present

## 2021-08-10 DIAGNOSIS — G373 Acute transverse myelitis in demyelinating disease of central nervous system: Secondary | ICD-10-CM | POA: Diagnosis not present

## 2021-08-10 DIAGNOSIS — Z794 Long term (current) use of insulin: Secondary | ICD-10-CM | POA: Diagnosis not present

## 2021-08-10 DIAGNOSIS — I152 Hypertension secondary to endocrine disorders: Secondary | ICD-10-CM | POA: Diagnosis not present

## 2021-08-10 DIAGNOSIS — I959 Hypotension, unspecified: Secondary | ICD-10-CM | POA: Diagnosis not present

## 2021-08-10 DIAGNOSIS — N481 Balanitis: Secondary | ICD-10-CM | POA: Diagnosis not present

## 2021-08-10 DIAGNOSIS — Z433 Encounter for attention to colostomy: Secondary | ICD-10-CM | POA: Diagnosis not present

## 2021-08-10 DIAGNOSIS — R630 Anorexia: Secondary | ICD-10-CM | POA: Diagnosis not present

## 2021-08-10 DIAGNOSIS — R1312 Dysphagia, oropharyngeal phase: Secondary | ICD-10-CM | POA: Diagnosis not present

## 2021-08-10 DIAGNOSIS — M549 Dorsalgia, unspecified: Secondary | ICD-10-CM | POA: Diagnosis not present

## 2021-08-10 DIAGNOSIS — R532 Functional quadriplegia: Secondary | ICD-10-CM | POA: Diagnosis not present

## 2021-08-10 DIAGNOSIS — M2550 Pain in unspecified joint: Secondary | ICD-10-CM | POA: Diagnosis not present

## 2021-08-10 DIAGNOSIS — R52 Pain, unspecified: Secondary | ICD-10-CM | POA: Diagnosis not present

## 2021-08-10 DIAGNOSIS — M6281 Muscle weakness (generalized): Secondary | ICD-10-CM | POA: Diagnosis not present

## 2021-08-10 DIAGNOSIS — F411 Generalized anxiety disorder: Secondary | ICD-10-CM | POA: Diagnosis not present

## 2021-08-10 DIAGNOSIS — E46 Unspecified protein-calorie malnutrition: Secondary | ICD-10-CM | POA: Diagnosis not present

## 2021-08-10 DIAGNOSIS — G4733 Obstructive sleep apnea (adult) (pediatric): Secondary | ICD-10-CM | POA: Diagnosis not present

## 2021-08-10 DIAGNOSIS — R41841 Cognitive communication deficit: Secondary | ICD-10-CM | POA: Diagnosis not present

## 2021-08-10 DIAGNOSIS — G360 Neuromyelitis optica [Devic]: Secondary | ICD-10-CM | POA: Diagnosis not present

## 2021-08-10 MED ORDER — CYANOCOBALAMIN 500 MCG PO TABS: 500.0000 ug | ORAL_TABLET | Freq: Every day | ORAL | Status: DC

## 2021-08-10 MED ORDER — ALBUTEROL SULFATE (2.5 MG/3ML) 0.083% IN NEBU: 2.5000 mg | INHALATION_SOLUTION | Freq: Four times a day (QID) | RESPIRATORY_TRACT | Status: DC | PRN

## 2021-08-10 MED ORDER — GENTAMICIN SULFATE 40 MG/ML IJ SOLN
5.0000 mg/kg | Freq: Once | INTRAMUSCULAR | Status: AC
  Administered 2021-08-10: 450 mg via INTRAVENOUS
  Filled 2021-08-10: qty 11.25

## 2021-08-10 NOTE — TOC Transition Note (Signed)
Transition of Care [facility]) - CM/SW Discharge Note   Patient Details  Name: Brent [name]. MRN: [id number] Date of Birth: 25-Jun-1946  Transition of Care [facility]) CM/SW Contact:  Emeterio Reeve, LCSW Phone Number: 08/10/2021, 1:55 PM   Clinical Narrative:      Per MD patient ready for DC to [facility]. RN, patient,  and facility notified of DC. Discharge Summary and FL2 sent to facility. DC packet on chart. Pt is covid negative. Ambulance transport requested for patient.    RN to call report to [phone].  CSW will sign off for now as social work intervention is no longer needed. Please consult [place] again if new needs arise.   Final next level of care: [place] Barriers to Discharge: Barriers Resolved   Patient Goals and CMS Choice        Discharge Placement              Patient [facility] at: [facility] Patient to be transferred to facility by: ptar Name of family member notified: pt aware Patient and family notified of of transfer: 08/10/21  Discharge Plan and Services                                     Social Determinants of Health ([place]) Interventions     Readmission Risk Interventions Readmission Risk Prevention Plan 06/08/2020  Transportation Screening Complete  Medication Review [employer]) Referral to Pharmacy  PCP or Specialist appointment within 3-5 days of discharge Not Complete  PCP/Specialist Appt Not Complete comments SNF resident  [facility] or [place] Complete  SW Recovery Care/Counseling Consult Complete  Palliative Care Screening Complete  Skilled [place] Complete  Some recent data might be hidden    Emeterio Reeve, LCSW Clinical Social Worker

## 2021-08-10 NOTE — Progress Notes (Signed)
Unable to run full course of IVPB Maxipime as per pt started c/o itch as per pt yesterday had a rash from it. MD informed

## 2021-08-10 NOTE — Consult Note (Addendum)
[facility] CM Inpatient Consult   08/10/2021  Brent [name] 02/06/1946 [id number]  [place] [facility] CM)   Patient chart reviewed with noted extreme high risk score for unplanned readmission. Per review, patient to return to [facility]. No [facility] CM follow up.  Of note, [facility] Care Management services does not replace or interfere with any services that are arranged by inpatient case management or social work.   Netta Cedars, MSN, RN [facility] [employer] [phone]  Toll free office [phone]

## 2021-08-10 NOTE — Discharge Summary (Signed)
Physician Discharge Summary  Daine Gip. ERX:[id number] DOB: 1946/02/29 DOA: 08/05/2021  PCP: Raymondo Band, MD  Admit date: 08/05/2021 Discharge date: 08/10/2021  Admitted From: SNF  Discharge disposition: Skilled facility  Recommendations for Outpatient Follow-Up:   Follow up with your primary care provider in one week.  Check CBC, BMP, magnesium in the next visit  Discharge Diagnosis:   Principal Problem:   Catheter-associated urinary tract infection ([place]) Active Problems:   Obstructive sleep apnea   Essential hypertension   Paraplegia (HCC)   Coronary artery disease involving native coronary artery of native heart without angina pectoris   Controlled type 2 diabetes mellitus with hyperglycemia, with long-term current use of insulin (HCC)   Neurogenic bladder   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HCC)   Sacral ulcer, with necrosis of muscle (HCC)   Hypokalemia   Complicated UTI (urinary tract infection)   Discharge Condition: Improved.  Diet recommendation: Low sodium, heart healthy.  Carbohydrate-modified.    Wound care: See below  Code status: Full.  History of Present Illness:   Patient 75 year old gentleman history of neuromyelitis optica with paraplegia and neurogenic bladder, CAD, OSA on CPAP, hypertension, insulin-dependent diabetes mellitus, pressure ulcers presented to the ED with nausea, loss of appetite, abdominal pain, 3 to 4 days of nausea with difficulty with oral intake.  Patient with history of sigmoid colostomy created September 2021 due to sacral ulcer and incontinence.  Patient seen in the ED. Head CT was negative, CT abdomen and pelvis with Foley balloon in the prostatic urethra and trace bilateral pleural effusions.  Patient was noted on lab work to have a potassium of 2.8, albumin of 2.6, normocytic anemia, normal troponin, urinalysis concerning for UTI.  Patient was pancultured.  Patient placed empirically on IV cefepime and was admitted hospital  for further evaluation and treatment.Marland Kitchen   Hospital Course:   Following conditions were addressed during hospitalization as listed below,  Catheter associated UTI secondary to Pseudomonas aeruginosa Patient with chronic Foley catheter in place.  Urine culture with more than 70,000 colonies of Pseudomonas.  Received IV cefepime during hospitalization.  Patient does have allergy to fluoroquinolones and penicillin.  Spoke with the ID pharmacist in the hospital.  Patient will receive 1 dose of gentamicin prior to discharge which will complete 7-day course.  Hypokalemia Improved after replacement.   Hypomagnesemia.  Replenished.  Insulin-dependent diabetes mellitus Patient will resume insulin regimen from home.  Neuromyelitis optica/paraplegia  Patient has been followed by neurology as outpatient.  Plan for next dose of Rituxan in December.  Coronary artery disease On Lipitor, losartan and aspirin.  Essential hypertension Continue losartan, Norvasc on discharge.  Abdominal pain, nausea Secondary to UTI.  CT scan of the abdomen and pelvis without any acute findings.  Has improved.  OSA Continue CPAP at nighttime.   Stage IV sacral ulceration present on admission.  Left heel deep tissue injury.  Continue wound care.  Disposition.  At this time, patient is stable for disposition to skilled nursing facility.  Medical Consultants:   None.  Procedures:    None Subjective:   Today, patient was seen and examined at bedside.  Patient denies any nausea vomiting fever or chills.  Discharge Exam:   Vitals:   08/10/21 0507 08/10/21 0826  BP: (!) 143/59 (!) 163/65  Pulse: (!) 59 61  Resp:  18  Temp: 98.1 F (36.7 C) 98.3 F (36.8 C)  SpO2: 98% 99%   Vitals:   08/09/21 2251 08/09/21 2251 08/10/21 0507 08/10/21 9509  BP: (!) 144/57 (!) 144/57 (!) 143/59 (!) 163/65  Pulse: 63 64 (!) 59 61  Resp:    18  Temp: 98.2 F (36.8 C) 98.2 F (36.8 C) 98.1 F (36.7 C) 98.3 F (36.8  C)  TempSrc: Oral Oral Oral Oral  SpO2: 98% 98% 98% 99%  Weight:      Height:       Body mass index is 27.12 kg/m.   General: Alert awake, not in obvious distress HENT: pupils equally reacting to light,  No scleral pallor or icterus noted. Oral mucosa is moist.  Chest:  Clear breath sounds.  Diminished breath sounds bilaterally. No crackles or wheezes.  CVS: S1 &S2 heard. No murmur.  Regular rate and rhythm. Abdomen: Soft, nontender, nondistended.  Bowel sounds are heard.  Urinary catheter in place.  PEG tube in place.  Colostomy bag in place. Extremities: No cyanosis, clubbing or edema.  Peripheral pulses are palpable. Psych: Alert, awake and oriented, normal mood CNS:  No cranial nerve deficits.  Paraplegia. Skin: Warm and dry.  No rashes noted.  The results of significant diagnostics from this hospitalization (including imaging, microbiology, ancillary and laboratory) are listed below for reference.     Diagnostic Studies:   CT Head Wo Contrast  Result Date: 08/06/2021 CLINICAL DATA:  Headache.  Concern for intracranial hemorrhage. EXAM: CT HEAD WITHOUT CONTRAST TECHNIQUE: Contiguous axial images were obtained from the base of the skull through the vertex without intravenous contrast. COMPARISON:  Head CT dated 03/10/2020. FINDINGS: Brain: Mild age-related atrophy and chronic microvascular ischemic changes. There is no acute intracranial hemorrhage. No mass effect or midline shift. Right posterior fossa probable arachnoid cyst similar to prior CT. Vascular: No hyperdense vessel or unexpected calcification. Skull: Normal. Negative for fracture or focal lesion. Sinuses/Orbits: No acute finding. Other: None IMPRESSION: 1. No acute intracranial pathology. 2. Mild age-related atrophy and chronic microvascular ischemic changes. Electronically Signed   By: Anner Crete M.D.   On: 08/06/2021 01:31   CT ABDOMEN PELVIS W CONTRAST  Result Date: 08/06/2021 CLINICAL DATA:  Abdominal pain  EXAM: CT ABDOMEN AND PELVIS WITH CONTRAST TECHNIQUE: Multidetector CT imaging of the abdomen and pelvis was performed using the standard protocol following bolus administration of intravenous contrast. CONTRAST:  156mL OMNIPAQUE IOHEXOL 300 MG/ML  SOLN COMPARISON:  CT abdomen pelvis dated 12/21/2020. FINDINGS: Lower chest: Trace bilateral pleural effusions with associated partial compressive atelectasis of the lower lobes. There is 3 vessel coronary vascular calcification. Partially visualized small pericardial effusion. No intra-abdominal free air or free fluid. Hepatobiliary: Fatty liver. No intrahepatic biliary dilatation. Cholecystectomy. Pancreas: Unremarkable. No pancreatic ductal dilatation or surrounding inflammatory changes. Spleen: Normal in size without focal abnormality. Adrenals/Urinary Tract: The adrenal glands unremarkable. Similar appearance of cyst in the upper pole of the left kidney with layering high density material. There is a 2 cm left renal medial interpolar cyst. There is no hydronephrosis on either side. There is symmetric enhancement and excretion of contrast by both kidneys. Abdominal the visualized ureters appear unremarkable. Diffuse thickened and hazy appearance of the bladder wall concerning for cystitis. Correlation with urinalysis recommended. A Foley catheter is noted with balloon in the region of the prostatic urethra. Recommend further advancing of the catheter by additional 7 cm. Stomach/Bowel: Percutaneous gastrostomy with balloon in the body of the stomach. There is severe sigmoid and scattered colonic diverticulosis without active inflammatory changes. There is a left lower quadrant loop colostomy. There is no bowel obstruction or active inflammation. The appendix is  normal. Vascular/Lymphatic: Advanced aortoiliac atherosclerotic disease. The IVC is unremarkable. No portal venous gas. There is no adenopathy. Reproductive: The prostate is grossly unremarkable Other: Of diffuse  subcutaneous edema. Musculoskeletal: Osteopenia with degenerative changes of the spine and hips. No acute osseous pathology. IMPRESSION: 1. Foley catheter with balloon in the region of the prostatic urethra. Recommend further advancing of the catheter by additional 7 cm. 2. Severe colonic diverticulosis. No bowel obstruction. Normal appendix. 3. Fatty liver. 4. Trace bilateral pleural effusions with associated partial compressive atelectasis of the lower lobes. 5. Aortic Atherosclerosis (ICD10-I70.0). Electronically Signed   By: Anner Crete M.D.   On: 08/06/2021 01:36   DG Chest Port 1 View  Result Date: 08/05/2021 CLINICAL DATA:  Headache and nausea. EXAM: PORTABLE CHEST 1 VIEW COMPARISON:  December 21, 2020 FINDINGS: The heart size and mediastinal contours are within normal limits. There is moderate severity calcification of the aortic arch. Both lungs are clear. A 3.7 cm x 3.2 cm well-defined opacity is seen overlying the body of the stomach. The visualized skeletal structures are unremarkable. IMPRESSION: No active disease. Electronically Signed   By: Virgina Norfolk M.D.   On: 08/05/2021 23:45     Labs:   Basic Metabolic Panel: Recent Labs  Lab 08/05/21 2341 08/06/21 0530 08/06/21 1000 08/07/21 0038 08/08/21 0104 08/09/21 0200  NA 138  --  135 136 135 134*  K 2.8*  --  4.6 3.3* 3.9 3.5  CL 102  --  105 107 105 104  CO2 29  --  21* 23 26 23   GLUCOSE 187*  --  177* 200* 281* 285*  BUN <5*  --  6* 6* 11 7*  CREATININE 0.50*  --  0.47* 0.48* 0.45* 0.47*  CALCIUM 9.0  --  8.4* 8.4* 8.2* 8.3*  MG  --  1.5*  --  1.8 1.9 1.6*   GFR Estimated Creatinine Clearance: 86.2 mL/min (A) (by C-G formula based on SCr of 0.47 mg/dL (L)). Liver Function Tests: Recent Labs  Lab 08/05/21 2341  AST 14*  ALT 13  ALKPHOS 75  BILITOT 0.6  PROT 6.1*  ALBUMIN 2.6*   Recent Labs  Lab 08/05/21 2341  LIPASE 20   No results for input(s): AMMONIA in the last 168 hours. Coagulation  profile Recent Labs  Lab 08/05/21 2341  INR 1.1    CBC: Recent Labs  Lab 08/05/21 2341 08/06/21 1000 08/07/21 0038 08/08/21 0104 08/09/21 0200  WBC 8.2 7.1 6.4 6.1 7.9  NEUTROABS 6.0 4.9  --   --   --   HGB 11.6* 10.7* 10.3* 9.8* 10.6*  HCT 38.3* 34.5* 33.5* 31.9* 33.9*  MCV 83.3 85.2 83.8 83.9 83.1  PLT 328 256 273 261 253   Cardiac Enzymes: No results for input(s): CKTOTAL, CKMB, CKMBINDEX, TROPONINI in the last 168 hours. BNP: Invalid input(s): POCBNP CBG: Recent Labs  Lab 08/08/21 0814 08/08/21 1151 08/08/21 1549 08/09/21 1108 08/09/21 2248  GLUCAP 175* 220* 304* 135* 195*   D-Dimer No results for input(s): DDIMER in the last 72 hours. Hgb A1c No results for input(s): HGBA1C in the last 72 hours. Lipid Profile No results for input(s): CHOL, HDL, LDLCALC, TRIG, CHOLHDL, LDLDIRECT in the last 72 hours. Thyroid function studies No results for input(s): TSH, T4TOTAL, T3FREE, THYROIDAB in the last 72 hours.  Invalid input(s): FREET3 Anemia work up No results for input(s): VITAMINB12, FOLATE, FERRITIN, TIBC, IRON, RETICCTPCT in the last 72 hours. Microbiology Recent Results (from the past 240 hour(s))  Resp  Panel by RT-PCR (Flu A&B, Covid) Nasopharyngeal Swab     Status: None   Collection Time: 08/05/21 11:26 PM   Specimen: Nasopharyngeal Swab; Nasopharyngeal(NP) swabs in vial transport medium  Result Value Ref Range Status   SARS Coronavirus 2 by RT PCR NEGATIVE NEGATIVE Final    Comment: (NOTE) SARS-CoV-2 target nucleic acids are NOT DETECTED.  The SARS-CoV-2 RNA is generally detectable in upper respiratory specimens during the acute phase of infection. The lowest concentration of SARS-CoV-2 viral copies this assay can detect is 138 copies/mL. A negative result does not preclude SARS-Cov-2 infection and should not be used as the sole basis for treatment or other patient management decisions. A negative result may occur with  improper specimen  collection/handling, submission of specimen other than nasopharyngeal swab, presence of viral mutation(s) within the areas targeted by this assay, and inadequate number of viral copies(<138 copies/mL). A negative result must be combined with clinical observations, patient history, and epidemiological information. The expected result is Negative.  Fact Sheet for Patients:  EntrepreneurPulse.com.au  Fact Sheet for Healthcare Providers:  IncredibleEmployment.be  This test is no t yet approved or cleared by the [place] FDA and  has been authorized for detection and/or diagnosis of SARS-CoV-2 by FDA under an Emergency Use Authorization (EUA). This EUA will remain  in effect (meaning this test can be used) for the duration of the COVID-19 declaration under Section 564(b)(1) of the Act, 21 U.S.C.section 360bbb-3(b)(1), unless the authorization is terminated  or revoked sooner.       Influenza A by PCR NEGATIVE NEGATIVE Final   Influenza B by PCR NEGATIVE NEGATIVE Final    Comment: (NOTE) The Xpert Xpress SARS-CoV-2/FLU/RSV plus assay is intended as an aid in the diagnosis of influenza from Nasopharyngeal swab specimens and should not be used as a sole basis for treatment. Nasal washings and aspirates are unacceptable for Xpert Xpress SARS-CoV-2/FLU/RSV testing.  Fact Sheet for Patients: EntrepreneurPulse.com.au  Fact Sheet for Healthcare Providers: IncredibleEmployment.be  This test is not yet approved or cleared by the [place] FDA and has been authorized for detection and/or diagnosis of SARS-CoV-2 by FDA under an Emergency Use Authorization (EUA). This EUA will remain in effect (meaning this test can be used) for the duration of the COVID-19 declaration under Section 564(b)(1) of the Act, 21 U.S.C. section 360bbb-3(b)(1), unless the authorization is terminated or revoked.  Performed at [facility], [place] [address], [place], [place] [address]   Blood Culture (routine x 2)     Status: None (Preliminary result)   Collection Time: 08/05/21 11:26 PM   Specimen: BLOOD  Result Value Ref Range Status   Specimen Description BLOOD RIGHT ANTECUBITAL  Final   Special Requests   Final    BOTTLES DRAWN AEROBIC AND ANAEROBIC Blood Culture adequate volume   Culture   Final    NO GROWTH 4 DAYS Performed at [facility], [place] [address], [place], [place] [address]    Report Status PENDING  Incomplete  Urine Culture     Status: Abnormal   Collection Time: 08/05/21 11:26 PM   Specimen: In/Out Cath Urine  Result Value Ref Range Status   Specimen Description IN/OUT CATH URINE  Final   Special Requests   Final    NONE Performed at [facility], [place] [address], [place], [place] [address]    Culture 70,000 COLONIES/mL PSEUDOMONAS AERUGINOSA (A)  Final   Report Status 08/08/2021 FINAL  Final   Organism  ID, Bacteria PSEUDOMONAS AERUGINOSA (A)  Final      Susceptibility   Pseudomonas aeruginosa - MIC*    CEFTAZIDIME 4 SENSITIVE Sensitive     CIPROFLOXACIN 0.5 SENSITIVE Sensitive     GENTAMICIN <=1 SENSITIVE Sensitive     IMIPENEM 1 SENSITIVE Sensitive     PIP/TAZO 8 SENSITIVE Sensitive     CEFEPIME 2 SENSITIVE Sensitive     * 70,000 COLONIES/mL PSEUDOMONAS AERUGINOSA  Blood Culture (routine x 2)     Status: None (Preliminary result)   Collection Time: 08/06/21 12:18 AM   Specimen: BLOOD RIGHT FOREARM  Result Value Ref Range Status   Specimen Description BLOOD RIGHT FOREARM  Final   Special Requests   Final    BOTTLES DRAWN AEROBIC AND ANAEROBIC Blood Culture results may not be optimal due to an excessive volume of blood received in culture bottles   Culture   Final    NO GROWTH 4 DAYS Performed at [facility], [place] [address] [place], [place] [address]    Report Status PENDING  Incomplete  Resp Panel by RT-PCR (Flu A&B, Covid) Nasopharyngeal Swab     Status:  None   Collection Time: 08/09/21  3:29 PM   Specimen: Nasopharyngeal Swab; Nasopharyngeal(NP) swabs in vial transport medium  Result Value Ref Range Status   SARS Coronavirus 2 by RT PCR NEGATIVE NEGATIVE Final    Comment: (NOTE) SARS-CoV-2 target nucleic acids are NOT DETECTED.  The SARS-CoV-2 RNA is generally detectable in upper respiratory specimens during the acute phase of infection. The lowest concentration of SARS-CoV-2 viral copies this assay can detect is 138 copies/mL. A negative result does not preclude SARS-Cov-2 infection and should not be used as the sole basis for treatment or other patient management decisions. A negative result may occur with  improper specimen collection/handling, submission of specimen other than nasopharyngeal swab, presence of viral mutation(s) within the areas targeted by this assay, and inadequate number of viral copies(<138 copies/mL). A negative result must be combined with clinical observations, patient history, and epidemiological information. The expected result is Negative.  Fact Sheet for Patients:  EntrepreneurPulse.com.au  Fact Sheet for Healthcare Providers:  IncredibleEmployment.be  This test is no t yet approved or cleared by the [place] FDA and  has been authorized for detection and/or diagnosis of SARS-CoV-2 by FDA under an Emergency Use Authorization (EUA). This EUA will remain  in effect (meaning this test can be used) for the duration of the COVID-19 declaration under Section 564(b)(1) of the Act, 21 U.S.C.section 360bbb-3(b)(1), unless the authorization is terminated  or revoked sooner.       Influenza A by PCR NEGATIVE NEGATIVE Final   Influenza B by PCR NEGATIVE NEGATIVE Final    Comment: (NOTE) The Xpert Xpress SARS-CoV-2/FLU/RSV plus assay is intended as an aid in the diagnosis of influenza from Nasopharyngeal swab specimens and should not be used as a sole basis for  treatment. Nasal washings and aspirates are unacceptable for Xpert Xpress SARS-CoV-2/FLU/RSV testing.  Fact Sheet for Patients: EntrepreneurPulse.com.au  Fact Sheet for Healthcare Providers: IncredibleEmployment.be  This test is not yet approved or cleared by the [place] FDA and has been authorized for detection and/or diagnosis of SARS-CoV-2 by FDA under an Emergency Use Authorization (EUA). This EUA will remain in effect (meaning this test can be used) for the duration of the COVID-19 declaration under Section 564(b)(1) of the Act, 21 U.S.C. section 360bbb-3(b)(1), unless the authorization is terminated or revoked.  Performed at [facility], [place] [address], [place], [place] [address]      Discharge Instructions:   Discharge Instructions     Call MD for:  persistant nausea and vomiting   Complete by: As directed    Call MD for:  severe uncontrolled pain   Complete by: As directed    Call MD for:  temperature >100.4   Complete by: As directed    Diet general   Complete by: As directed    Discharge instructions   Complete by: As directed    Follow-up with your primary care provider at the skilled nursing facility in 3 to 5 days.   Discharge wound care:   Complete by: As directed    1. Apply Aquacel Kellie Simmering # [phone]) to right ischium and sacrum wounds Q day, then cover with foam dressing.  (Change foam dressings Q 3 days or PRN soiling.) 2. Foam dressing to left heel and right posterior leg, change Q 3 days or PRN soiling. 1. Apply Aquacel Kellie Simmering # [phone]) to right ischium and sacrum wounds Q day, then cover with foam dressing.  (Change foam dressings Q 3 days or PRN soiling.) 2. Foam dressing to left heel and right posterior leg, change Q 3 days or PRN soiling.   Increase activity slowly   Complete by: As directed       Allergies as of 08/10/2021       Reactions   Levofloxacin Rash   Penicillins Hives, Rash    Tolerated Unasyn, Augmentin, cefepime and cephalexin        Medication List     STOP taking these medications    nitrofurantoin (macrocrystal-monohydrate) 100 MG capsule Commonly known as: MACROBID       TAKE these medications    acetaminophen 325 MG tablet Commonly known as: TYLENOL Take 2 tablets (650 mg total) by mouth every 6 (six) hours as needed for headache.   acetic acid 0.25 % irrigation Irrigate with 1 application as directed daily. For use on moist to dry gauze then applied to sacrum ulcer and right gluteus ulcer daily   Acidophilus Lactobacillus Caps Take 1 capsule by mouth daily.   albuterol (2.5 MG/3ML) 0.083% nebulizer solution Commonly known as: PROVENTIL Take 3 mLs (2.5 mg total) by nebulization every 6 (six) hours as needed for wheezing or shortness of breath (or anaphylaxis due to Rituxan infusion).   antiseptic oral rinse Liqd 10 mLs by Mouth Rinse route in the morning, at noon, and at bedtime. Swish & Spit   aspirin EC 81 MG tablet Take 81 mg by mouth daily. Swallow whole.   atorvastatin 20 MG tablet Commonly known as: LIPITOR Take 1 tablet (20 mg total) by mouth daily. What changed: when to take this   Baclofen 5 MG Tabs Take 10 mg by mouth in the morning and at bedtime.   Biofreeze 4 % Gel Generic drug: Menthol (Topical Analgesic) Apply 1 application topically in the morning, at noon, and at bedtime. To back and shoulders   Icy Hot Advanced Relief 7.5 % Ptch Generic drug: Menthol Apply 1 patch topically daily. Place on left arm every morning, remove at night   dronabinol 2.5 MG capsule Commonly known as: MARINOL Take 2.5 mg by mouth in the morning and at bedtime.   feeding supplement (PRO-STAT SUGAR FREE 64) Liqd Take 30 mLs by mouth in the morning and at bedtime.   ferrous gluconate 324 MG tablet Commonly known as: FERGON Take 324 mg by mouth every other  day.   fexofenadine 180 MG tablet Commonly known as: ALLEGRA Take 180 mg  by mouth daily.   fluticasone 50 MCG/ACT nasal spray Commonly known as: FLONASE Place 2 sprays into both nostrils 2 (two) times daily. What changed:  how much to take when to take this   insulin aspart 100 UNIT/ML injection Commonly known as: novoLOG Inject 0-15 Units into the skin every 4 (four) hours. Sliding scale  CBG 70 - 120: 0 units: CBG 121 - 150: 2 units; CBG 151 - 200: 3 units; CBG 201 - 250: 5 units; CBG 251 - 300: 8 units;CBG 301 - 350: 11 units; CBG 351 - 400: 15 units; CBG > 400 : 15 units and notify MD What changed:  when to take this additional instructions   insulin glargine 100 unit/mL Sopn Commonly known as: LANTUS Inject 15 Units into the skin at bedtime. What changed:  how much to take when to take this additional instructions   Lidocaine 4 % Ptch Apply 1 patch topically 2 (two) times daily. Apply to right hip in the morning remove at night   losartan 25 MG tablet Commonly known as: COZAAR Take 50 mg by mouth daily.   metFORMIN 500 MG 24 hr tablet Commonly known as: GLUCOPHAGE-XR Take 1,000 mg by mouth in the morning and at bedtime.   methenamine 1 g tablet Commonly known as: HIPREX Take 1 g by mouth 2 (two) times daily.   ondansetron 4 MG disintegrating tablet Commonly known as: ZOFRAN-ODT Take 4 mg by mouth every 8 (eight) hours as needed for nausea or vomiting.   pantoprazole 40 MG tablet Commonly known as: PROTONIX Take 40 mg by mouth 2 (two) times daily.   polyvinyl alcohol 1.4 % ophthalmic solution Commonly known as: LIQUIFILM TEARS Place 2 drops into both eyes in the morning and at bedtime.   PSYLLIUM PO Take 0.8 g by mouth in the morning and at bedtime.   senna-docusate 8.6-50 MG tablet Commonly known as: Senokot-S Take 2 tablets by mouth 2 (two) times daily.   sertraline 50 MG tablet Commonly known as: ZOLOFT Take 25 mg by mouth at bedtime.   Simethicone 180 MG Caps Take 180 mg by mouth in the morning and at bedtime.    traMADol 50 MG tablet Commonly known as: ULTRAM Take 50 mg by mouth every 12 (twelve) hours as needed for moderate pain.   traZODone 50 MG tablet Commonly known as: DESYREL Take 50 mg by mouth at bedtime.   TRIAD HYDROPHILIC WOUND DRESS EX Apply 1 application topically daily. Surrounding skin to sacrum and right gluteus wound bed daily during dressing changes   Visine Dry Eye Relief 1 % Soln Generic drug: Polyethylene Glycol 400 Place 2 drops into both eyes daily.   Vitamin B-12 5000 MCG Subl Place 5,000 mcg under the tongue every Monday.   vitamin B-12 500 MCG tablet Commonly known as: CYANOCOBALAMIN Take 1 tablet (500 mcg total) by mouth daily.   vitamin C 500 MG tablet Commonly known as: ASCORBIC ACID Take 500 mg by mouth daily.               Discharge Care Instructions  (From admission, onward)           Start     Ordered   08/10/21 0000  Discharge wound care:       Comments: 1. Apply Aquacel Kellie Simmering # [phone]) to right ischium and sacrum wounds Q day, then cover with foam dressing.  (Change foam  dressings Q 3 days or PRN soiling.) 2. Foam dressing to left heel and right posterior leg, change Q 3 days or PRN soiling. 1. Apply Aquacel Kellie Simmering # [phone]) to right ischium and sacrum wounds Q day, then cover with foam dressing.  (Change foam dressings Q 3 days or PRN soiling.) 2. Foam dressing to left heel and right posterior leg, change Q 3 days or PRN soiling.   08/10/21 0935            Follow-up Information     Burnell Blanks, MD .   Specialty: Cardiology Contact information: [address] [place] [address] [phone]                  Time coordinating discharge: 39 minutes  Signed:  Aslan Montagna  [facility] 08/10/2021, 9:36 AM

## 2021-08-11 LAB — CULTURE, BLOOD (ROUTINE X 2)
Culture: NO GROWTH
Culture: NO GROWTH
Special Requests: ADEQUATE

## 2021-08-13 DIAGNOSIS — Z794 Long term (current) use of insulin: Secondary | ICD-10-CM | POA: Diagnosis not present

## 2021-08-13 DIAGNOSIS — G360 Neuromyelitis optica [Devic]: Secondary | ICD-10-CM | POA: Diagnosis not present

## 2021-08-13 DIAGNOSIS — E46 Unspecified protein-calorie malnutrition: Secondary | ICD-10-CM | POA: Diagnosis not present

## 2021-08-13 DIAGNOSIS — D509 Iron deficiency anemia, unspecified: Secondary | ICD-10-CM | POA: Diagnosis not present

## 2021-08-13 DIAGNOSIS — N390 Urinary tract infection, site not specified: Secondary | ICD-10-CM | POA: Diagnosis not present

## 2021-08-13 DIAGNOSIS — G8220 Paraplegia, unspecified: Secondary | ICD-10-CM | POA: Diagnosis not present

## 2021-08-13 DIAGNOSIS — E876 Hypokalemia: Secondary | ICD-10-CM | POA: Diagnosis not present

## 2021-08-13 DIAGNOSIS — T83511A Infection and inflammatory reaction due to indwelling urethral catheter, initial encounter: Secondary | ICD-10-CM | POA: Diagnosis not present

## 2021-08-13 DIAGNOSIS — Z931 Gastrostomy status: Secondary | ICD-10-CM | POA: Diagnosis not present

## 2021-08-13 DIAGNOSIS — E1159 Type 2 diabetes mellitus with other circulatory complications: Secondary | ICD-10-CM | POA: Diagnosis not present

## 2021-08-13 DIAGNOSIS — G4733 Obstructive sleep apnea (adult) (pediatric): Secondary | ICD-10-CM | POA: Diagnosis not present

## 2021-08-13 DIAGNOSIS — G373 Acute transverse myelitis in demyelinating disease of central nervous system: Secondary | ICD-10-CM | POA: Diagnosis not present

## 2021-08-17 DIAGNOSIS — E1165 Type 2 diabetes mellitus with hyperglycemia: Secondary | ICD-10-CM | POA: Diagnosis not present

## 2021-08-17 DIAGNOSIS — E876 Hypokalemia: Secondary | ICD-10-CM | POA: Diagnosis not present

## 2021-08-18 DIAGNOSIS — F331 Major depressive disorder, recurrent, moderate: Secondary | ICD-10-CM | POA: Diagnosis not present

## 2021-08-18 DIAGNOSIS — F411 Generalized anxiety disorder: Secondary | ICD-10-CM | POA: Diagnosis not present

## 2021-08-19 DIAGNOSIS — M2550 Pain in unspecified joint: Secondary | ICD-10-CM | POA: Diagnosis not present

## 2021-08-19 DIAGNOSIS — A419 Sepsis, unspecified organism: Secondary | ICD-10-CM | POA: Diagnosis not present

## 2021-08-19 DIAGNOSIS — R6520 Severe sepsis without septic shock: Secondary | ICD-10-CM | POA: Diagnosis not present

## 2021-08-19 DIAGNOSIS — R6889 Other general symptoms and signs: Secondary | ICD-10-CM | POA: Diagnosis not present

## 2021-08-19 DIAGNOSIS — R110 Nausea: Secondary | ICD-10-CM | POA: Diagnosis not present

## 2021-08-19 DIAGNOSIS — L089 Local infection of the skin and subcutaneous tissue, unspecified: Secondary | ICD-10-CM | POA: Diagnosis not present

## 2021-08-19 DIAGNOSIS — Z433 Encounter for attention to colostomy: Secondary | ICD-10-CM | POA: Diagnosis not present

## 2021-08-19 DIAGNOSIS — R2681 Unsteadiness on feet: Secondary | ICD-10-CM | POA: Diagnosis not present

## 2021-08-19 DIAGNOSIS — M6281 Muscle weakness (generalized): Secondary | ICD-10-CM | POA: Diagnosis not present

## 2021-08-19 DIAGNOSIS — M359 Systemic involvement of connective tissue, unspecified: Secondary | ICD-10-CM | POA: Diagnosis not present

## 2021-08-19 DIAGNOSIS — Z7401 Bed confinement status: Secondary | ICD-10-CM | POA: Diagnosis not present

## 2021-08-19 DIAGNOSIS — I152 Hypertension secondary to endocrine disorders: Secondary | ICD-10-CM | POA: Diagnosis not present

## 2021-08-19 DIAGNOSIS — G360 Neuromyelitis optica [Devic]: Secondary | ICD-10-CM | POA: Diagnosis not present

## 2021-08-19 DIAGNOSIS — Z7409 Other reduced mobility: Secondary | ICD-10-CM | POA: Diagnosis not present

## 2021-08-19 DIAGNOSIS — M6259 Muscle wasting and atrophy, not elsewhere classified, multiple sites: Secondary | ICD-10-CM | POA: Diagnosis not present

## 2021-08-19 DIAGNOSIS — M869 Osteomyelitis, unspecified: Secondary | ICD-10-CM | POA: Diagnosis not present

## 2021-08-19 DIAGNOSIS — B965 Pseudomonas (aeruginosa) (mallei) (pseudomallei) as the cause of diseases classified elsewhere: Secondary | ICD-10-CM | POA: Diagnosis not present

## 2021-08-19 DIAGNOSIS — M4628 Osteomyelitis of vertebra, sacral and sacrococcygeal region: Secondary | ICD-10-CM | POA: Diagnosis not present

## 2021-08-19 DIAGNOSIS — R630 Anorexia: Secondary | ICD-10-CM | POA: Diagnosis not present

## 2021-08-19 DIAGNOSIS — R41841 Cognitive communication deficit: Secondary | ICD-10-CM | POA: Diagnosis not present

## 2021-08-19 DIAGNOSIS — R532 Functional quadriplegia: Secondary | ICD-10-CM | POA: Diagnosis not present

## 2021-08-19 DIAGNOSIS — R109 Unspecified abdominal pain: Secondary | ICD-10-CM | POA: Diagnosis not present

## 2021-08-19 DIAGNOSIS — G8250 Quadriplegia, unspecified: Secondary | ICD-10-CM | POA: Diagnosis not present

## 2021-08-19 DIAGNOSIS — N319 Neuromuscular dysfunction of bladder, unspecified: Secondary | ICD-10-CM | POA: Diagnosis not present

## 2021-08-19 DIAGNOSIS — E1165 Type 2 diabetes mellitus with hyperglycemia: Secondary | ICD-10-CM | POA: Diagnosis not present

## 2021-08-19 DIAGNOSIS — Z743 Need for continuous supervision: Secondary | ICD-10-CM | POA: Diagnosis not present

## 2021-08-19 DIAGNOSIS — N481 Balanitis: Secondary | ICD-10-CM | POA: Diagnosis not present

## 2021-08-19 DIAGNOSIS — G373 Acute transverse myelitis in demyelinating disease of central nervous system: Secondary | ICD-10-CM | POA: Diagnosis not present

## 2021-08-19 DIAGNOSIS — R1312 Dysphagia, oropharyngeal phase: Secondary | ICD-10-CM | POA: Diagnosis not present

## 2021-08-19 DIAGNOSIS — E1159 Type 2 diabetes mellitus with other circulatory complications: Secondary | ICD-10-CM | POA: Diagnosis not present

## 2021-08-19 DIAGNOSIS — R278 Other lack of coordination: Secondary | ICD-10-CM | POA: Diagnosis not present

## 2021-08-19 DIAGNOSIS — N390 Urinary tract infection, site not specified: Secondary | ICD-10-CM | POA: Diagnosis not present

## 2021-08-23 LAB — GLUCOSE, CAPILLARY
Glucose-Capillary: 368 mg/dL — ABNORMAL HIGH (ref 70–99)
Glucose-Capillary: 142 mg/dL — ABNORMAL HIGH (ref 70–99)
Glucose-Capillary: 168 mg/dL — ABNORMAL HIGH (ref 70–99)
Glucose-Capillary: 97 mg/dL (ref 70–99)
Glucose-Capillary: 197 mg/dL — ABNORMAL HIGH (ref 70–99)

## 2021-08-25 DIAGNOSIS — E876 Hypokalemia: Secondary | ICD-10-CM | POA: Diagnosis not present

## 2021-08-25 DIAGNOSIS — M549 Dorsalgia, unspecified: Secondary | ICD-10-CM | POA: Diagnosis not present

## 2021-08-25 DIAGNOSIS — R52 Pain, unspecified: Secondary | ICD-10-CM | POA: Diagnosis not present

## 2021-08-25 DIAGNOSIS — E870 Hyperosmolality and hypernatremia: Secondary | ICD-10-CM | POA: Diagnosis not present

## 2021-08-25 DIAGNOSIS — K9419 Other complications of enterostomy: Secondary | ICD-10-CM | POA: Diagnosis not present

## 2021-08-25 DIAGNOSIS — N319 Neuromuscular dysfunction of bladder, unspecified: Secondary | ICD-10-CM | POA: Diagnosis not present

## 2021-08-30 ENCOUNTER — Other Ambulatory Visit (INDEPENDENT_AMBULATORY_CARE_PROVIDER_SITE_OTHER): Payer: Self-pay

## 2021-08-30 DIAGNOSIS — Z79899 Other long term (current) drug therapy: Secondary | ICD-10-CM

## 2021-08-30 DIAGNOSIS — G360 Neuromyelitis optica [Devic]: Secondary | ICD-10-CM

## 2021-08-30 DIAGNOSIS — Z0289 Encounter for other administrative examinations: Secondary | ICD-10-CM

## 2021-09-01 DIAGNOSIS — N319 Neuromuscular dysfunction of bladder, unspecified: Secondary | ICD-10-CM | POA: Diagnosis not present

## 2021-09-01 DIAGNOSIS — M4628 Osteomyelitis of vertebra, sacral and sacrococcygeal region: Secondary | ICD-10-CM | POA: Diagnosis not present

## 2021-09-01 DIAGNOSIS — B965 Pseudomonas (aeruginosa) (mallei) (pseudomallei) as the cause of diseases classified elsewhere: Secondary | ICD-10-CM | POA: Diagnosis not present

## 2021-09-01 DIAGNOSIS — N481 Balanitis: Secondary | ICD-10-CM | POA: Diagnosis not present

## 2021-09-01 DIAGNOSIS — N390 Urinary tract infection, site not specified: Secondary | ICD-10-CM | POA: Diagnosis not present

## 2021-09-01 DIAGNOSIS — L089 Local infection of the skin and subcutaneous tissue, unspecified: Secondary | ICD-10-CM | POA: Diagnosis not present

## 2021-09-02 LAB — CBC WITH DIFFERENTIAL/PLATELET
Basophils Absolute: 0 10*3/uL (ref 0.0–0.2)
Basos: 0 %
EOS (ABSOLUTE): 0.2 10*3/uL (ref 0.0–0.4)
Eos: 2 %
Hematocrit: 34.4 % — ABNORMAL LOW (ref 37.5–51.0)
Hemoglobin: 11 g/dL — ABNORMAL LOW (ref 13.0–17.7)
Immature Grans (Abs): 0 10*3/uL (ref 0.0–0.1)
Immature Granulocytes: 0 %
Lymphocytes Absolute: 2.1 10*3/uL (ref 0.7–3.1)
Lymphs: 27 %
MCH: 25.9 pg — ABNORMAL LOW (ref 26.6–33.0)
MCHC: 32 g/dL (ref 31.5–35.7)
MCV: 81 fL (ref 79–97)
Monocytes Absolute: 0.5 10*3/uL (ref 0.1–0.9)
Monocytes: 7 %
Neutrophils Absolute: 4.9 10*3/uL (ref 1.4–7.0)
Neutrophils: 64 %
Platelets: 327 10*3/uL (ref 150–450)
RBC: 4.24 x10E6/uL (ref 4.14–5.80)
RDW: 15.3 % (ref 11.6–15.4)
WBC: 7.7 10*3/uL (ref 3.4–10.8)

## 2021-09-02 LAB — IGG, IGA, IGM
IgA/Immunoglobulin A, Serum: 262 mg/dL (ref 61–437)
IgG (Immunoglobin G), Serum: 832 mg/dL (ref 603–1613)
IgM (Immunoglobulin M), Srm: 48 mg/dL (ref 15–143)

## 2021-09-02 LAB — NEUROMYELITIS OPTICA AUTOAB, IGG: NMO IgG Autoantibodies: 2.8 U/mL (ref 0.0–3.0)

## 2021-09-02 LAB — CD19 AND CD20, FLOW CYTOMETRY

## 2021-09-07 DIAGNOSIS — G360 Neuromyelitis optica [Devic]: Secondary | ICD-10-CM | POA: Diagnosis not present

## 2021-09-07 DIAGNOSIS — G373 Acute transverse myelitis in demyelinating disease of central nervous system: Secondary | ICD-10-CM | POA: Diagnosis not present

## 2021-09-09 DIAGNOSIS — M359 Systemic involvement of connective tissue, unspecified: Secondary | ICD-10-CM | POA: Diagnosis not present

## 2021-09-09 DIAGNOSIS — R109 Unspecified abdominal pain: Secondary | ICD-10-CM | POA: Diagnosis not present

## 2021-09-09 DIAGNOSIS — E1165 Type 2 diabetes mellitus with hyperglycemia: Secondary | ICD-10-CM | POA: Diagnosis not present

## 2021-09-09 DIAGNOSIS — R110 Nausea: Secondary | ICD-10-CM | POA: Diagnosis not present

## 2021-09-09 DIAGNOSIS — Z433 Encounter for attention to colostomy: Secondary | ICD-10-CM | POA: Diagnosis not present

## 2021-10-26 ENCOUNTER — Telehealth: Payer: Self-pay | Admitting: Neurology

## 2021-10-26 NOTE — Telephone Encounter (Signed)
Pt's wife called stating that the pt has Covid and he is needing an anti viral but the [facility] provider Dr. Keenan Bachelor refuses to give it to the pt  until he knows what infusion pt receives. Please call wife with the name so that she can inform the nursing home. Pt is in room 407 at [facility].

## 2021-10-26 NOTE — Telephone Encounter (Signed)
Received another call from the staff member, Diane asking a written communication be sent to them stating ok for the pt to receive the antiviral.  This will be faxed to [phone] and attn to Dr Keenan Bachelor.

## 2021-10-26 NOTE — Telephone Encounter (Signed)
Called the wife who states that I need to contact [facility] directly. There number is [phone] and I am to ask to speak to nursing coverage for him room.  I have called [facility] and advised the staff to make Dr Keenan Bachelor aware that the pt can receive the antiviral medication for covid infection. His last dose of rituxin was in December. She advised she would pass along this information

## 2021-11-01 ENCOUNTER — Emergency Department (HOSPITAL_COMMUNITY)
Admission: EM | Admit: 2021-11-01 | Discharge: 2021-11-01 | Disposition: A | Discharge status: Skilled Nursing Facility | Payer: Medicare Other | Attending: Emergency Medicine | Admitting: Emergency Medicine

## 2021-11-01 ENCOUNTER — Encounter (HOSPITAL_COMMUNITY): Payer: Self-pay | Admitting: Emergency Medicine

## 2021-11-01 ENCOUNTER — Other Ambulatory Visit: Payer: Self-pay

## 2021-11-01 ENCOUNTER — Emergency Department (HOSPITAL_BASED_OUTPATIENT_CLINIC_OR_DEPARTMENT_OTHER): Payer: Medicare Other

## 2021-11-01 ENCOUNTER — Emergency Department (HOSPITAL_COMMUNITY): Payer: Medicare Other

## 2021-11-01 DIAGNOSIS — R600 Localized edema: Secondary | ICD-10-CM | POA: Diagnosis not present

## 2021-11-01 DIAGNOSIS — J189 Pneumonia, unspecified organism: Secondary | ICD-10-CM

## 2021-11-01 DIAGNOSIS — I10 Essential (primary) hypertension: Secondary | ICD-10-CM | POA: Diagnosis not present

## 2021-11-01 DIAGNOSIS — Z79899 Other long term (current) drug therapy: Secondary | ICD-10-CM | POA: Insufficient documentation

## 2021-11-01 DIAGNOSIS — U071 COVID-19: Secondary | ICD-10-CM | POA: Diagnosis not present

## 2021-11-01 DIAGNOSIS — E119 Type 2 diabetes mellitus without complications: Secondary | ICD-10-CM | POA: Diagnosis not present

## 2021-11-01 DIAGNOSIS — M7989 Other specified soft tissue disorders: Secondary | ICD-10-CM | POA: Diagnosis not present

## 2021-11-01 DIAGNOSIS — Z7982 Long term (current) use of aspirin: Secondary | ICD-10-CM | POA: Diagnosis not present

## 2021-11-01 DIAGNOSIS — Z794 Long term (current) use of insulin: Secondary | ICD-10-CM | POA: Insufficient documentation

## 2021-11-01 DIAGNOSIS — Z7984 Long term (current) use of oral hypoglycemic drugs: Secondary | ICD-10-CM | POA: Diagnosis not present

## 2021-11-01 DIAGNOSIS — R0602 Shortness of breath: Secondary | ICD-10-CM | POA: Diagnosis present

## 2021-11-01 LAB — COMPREHENSIVE METABOLIC PANEL
ALT: 18 U/L (ref 0–44)
AST: 24 U/L (ref 15–41)
Albumin: 2.6 g/dL — ABNORMAL LOW (ref 3.5–5.0)
Alkaline Phosphatase: 64 U/L (ref 38–126)
Anion gap: 10 (ref 5–15)
BUN: 12 mg/dL (ref 8–23)
CO2: 24 mmol/L (ref 22–32)
Calcium: 8.6 mg/dL — ABNORMAL LOW (ref 8.9–10.3)
Chloride: 101 mmol/L (ref 98–111)
Creatinine, Ser: 0.59 mg/dL — ABNORMAL LOW (ref 0.61–1.24)
GFR, Estimated: >60 mL/min (ref >60)
Glucose, Bld: 200 mg/dL — ABNORMAL HIGH (ref 70–99)
Potassium: 3.9 mmol/L (ref 3.5–5.1)
Sodium: 135 mmol/L (ref 135–145)
Total Bilirubin: 0.5 mg/dL (ref 0.3–1.2)
Total Protein: 6.1 g/dL — ABNORMAL LOW (ref 6.5–8.1)

## 2021-11-01 LAB — D-DIMER, QUANTITATIVE: D-Dimer, Quant: 0.74 ug/mL-FEU — ABNORMAL HIGH (ref 0.00–0.50)

## 2021-11-01 LAB — CBC WITH DIFFERENTIAL/PLATELET
Abs Immature Granulocytes: 0.06 10*3/uL (ref 0.00–0.07)
Basophils Absolute: 0 10*3/uL (ref 0.0–0.1)
Basophils Relative: 0 %
Eosinophils Absolute: 0 10*3/uL (ref 0.0–0.5)
Eosinophils Relative: 0 %
HCT: 36.3 % — ABNORMAL LOW (ref 39.0–52.0)
Hemoglobin: 11.3 g/dL — ABNORMAL LOW (ref 13.0–17.0)
Immature Granulocytes: 1 %
Lymphocytes Relative: 12 %
Lymphs Abs: 0.8 10*3/uL (ref 0.7–4.0)
MCH: 25.9 pg — ABNORMAL LOW (ref 26.0–34.0)
MCHC: 31.1 g/dL (ref 30.0–36.0)
MCV: 83.3 fL (ref 80.0–100.0)
Monocytes Absolute: 0.6 10*3/uL (ref 0.1–1.0)
Monocytes Relative: 9 %
Neutro Abs: 5.3 10*3/uL (ref 1.7–7.7)
Neutrophils Relative %: 78 %
Platelets: 336 10*3/uL (ref 150–400)
RBC: 4.36 MIL/uL (ref 4.22–5.81)
RDW: 15.7 % — ABNORMAL HIGH (ref 11.5–15.5)
WBC: 6.7 10*3/uL (ref 4.0–10.5)
nRBC: 0 % (ref 0.0–0.2)

## 2021-11-01 LAB — BRAIN NATRIURETIC PEPTIDE: B Natriuretic Peptide: 17.4 pg/mL (ref 0.0–100.0)

## 2021-11-01 LAB — RESP PANEL BY RT-PCR (FLU A&B, COVID) ARPGX2
Influenza A by PCR: NEGATIVE
Influenza B by PCR: NEGATIVE
SARS Coronavirus 2 by RT PCR: POSITIVE — AB

## 2021-11-01 MED ORDER — IOHEXOL 350 MG/ML SOLN
80.0000 mL | Freq: Once | INTRAVENOUS | Status: AC | PRN
  Administered 2021-11-01: 80 mL via INTRAVENOUS

## 2021-11-01 MED ORDER — ONDANSETRON HCL 4 MG/2ML IJ SOLN
4.0000 mg | Freq: Once | INTRAMUSCULAR | Status: AC
Start: 2021-11-01 — End: 2021-11-01
  Administered 2021-11-01: 4 mg via INTRAVENOUS
  Filled 2021-11-01: qty 2

## 2021-11-01 NOTE — ED Provider Notes (Signed)
CT angio was consistent with his prior diagnosis of pneumonia.  No evidence of blood clot noted.  Patient taken off of oxygen support and on room air he has O2 saturations about 97 to 98% which is appropriate.  The rate was clocked as being high, however after being taken off of oxygen and observed at bedside, his respiratory rate is more accurately about 18 to 23 breaths/min.  I feel the patient stable for continued care at his nursing home.  They have oxygen available should he need it.  However at this time he is doing well just on room air.  Patient discharged home in stable condition diagnosis of pneumonia.   Luna Fuse, MD 11/01/21 [phone]

## 2021-11-01 NOTE — ED Triage Notes (Signed)
Pt arrives via EMS from [facility] and rehabd for SOB. Pt was on 2L [place] when EMS arrived. Pt had chest xray on Friday for PNA. Pt has been on antibiotics since Friday. Pt does not normally wear O2, room air sats 98%. EMS states he felt better with it on. Decreased appetite over the past few days. CBG 240. Pt has taken all his meds today. No fever. HR 70, BP 142/60. Pt is a quadriplegic per EMS. Foley in place. Pt has nonproductive cough. Pt is alert and oriented.

## 2021-11-01 NOTE — ED Provider Notes (Signed)
[facility] EMERGENCY DEPARTMENT Provider Note   CSN: [id number] Arrival date & time: 11/01/21  1404     History  Chief complaint: Shortness of breath  Brent [name]. is a 76 y.o. male.  HPI  Patient has history of neuromyelitis optica, MI, hypertension, diabetes, obstructive sleep apnea and recent diagnosis of pneumonia.  Patient's resides at a nursing facility.  According to the EMS nursing home report patient has been getting treatment for pneumonia.  Patient confirms this.  He has had persistent issues with shortness of breath however and he was sent to the ED for further evaluation.  Patient denies any trouble with fevers.  He is not having any chest pain.  He has not noticed any swelling.  Home Medications Prior to Admission medications   Medication Sig Start Date End Date Taking? Authorizing Provider  acetaminophen (TYLENOL) 325 MG tablet Take 2 tablets (650 mg total) by mouth every 6 (six) hours as needed for headache. 04/24/20   Love, Ivan Anchors, PA-C  acetic acid 0.25 % irrigation Irrigate with 1 application as directed daily. For use on moist to dry gauze then applied to sacrum ulcer and right gluteus ulcer daily    [provider]  Acidophilus Lactobacillus CAPS Take 1 capsule by mouth daily.    [provider]  albuterol (PROVENTIL) (2.5 MG/3ML) 0.083% nebulizer solution Take 3 mLs (2.5 mg total) by nebulization every 6 (six) hours as needed for wheezing or shortness of breath (or anaphylaxis due to Rituxan infusion). 08/10/21   Pokhrel, Corrie Mckusick, MD  Amino Acids-Protein Hydrolys (FEEDING SUPPLEMENT, PRO-STAT SUGAR FREE 64,) LIQD Take 30 mLs by mouth in the morning and at bedtime.    [provider]  antiseptic oral rinse (BIOTENE) LIQD 10 mLs by Mouth Rinse route in the morning, at noon, and at bedtime. Swish & Spit    [provider]  aspirin EC 81 MG tablet Take 81 mg by mouth daily. Swallow whole.    [provider]  atorvastatin (LIPITOR) 20 MG tablet Take 1 tablet (20 mg total) by mouth daily. Patient taking differently: Take 20 mg by mouth at bedtime. 03/26/20   Shelly Coss, MD  Baclofen 5 MG TABS Take 10 mg by mouth in the morning and at bedtime. 02/25/21   [provider]  Cyanocobalamin (VITAMIN B-12) 5000 MCG SUBL Place 5,000 mcg under the tongue every Monday.    [provider]  dronabinol (MARINOL) 2.5 MG capsule Take 2.5 mg by mouth in the morning and at bedtime. 01/28/21   [provider]  ferrous gluconate (FERGON) 324 MG tablet Take 324 mg by mouth every other day.    [provider]  fexofenadine (ALLEGRA) 180 MG tablet Take 180 mg by mouth daily.    [provider]  fluticasone (FLONASE) 50 MCG/ACT nasal spray Place 2 sprays into both nostrils 2 (two) times daily. Patient taking differently: Place 1 spray into both nostrils daily. 04/24/20   Love, Ivan Anchors, PA-C  insulin aspart (NOVOLOG) 100 UNIT/ML injection Inject 0-15 Units into the skin every 4 (four) hours. Sliding scale  CBG 70 - 120: 0 units: CBG 121 - 150: 2 units; CBG 151 - 200: 3 units; CBG 201 - 250: 5 units; CBG 251 - 300: 8 units;CBG 301 - 350: 11 units; CBG 351 - 400: 15 units; CBG > 400 : 15 units and notify MD Patient taking differently: Inject 0-15 Units into the skin in the morning and at  bedtime. Sliding scale  If CBG>70 Call NP/PA, CBG 70 - 120: 0 units: CBG 121 - 150: 2 units; CBG 151 - 200: 3 units; CBG 201 - 250: 5 units; CBG 251 - 300: 8 units; CBG 301 - 350: 11 units; CBG 351 - 400: 15units and  CBG >400: 15 Units, CBG>400 Call MD 06/10/20   Rai, Ripudeep K, MD  insulin glargine (LANTUS) 100 unit/mL SOPN Inject 15 Units into the skin at bedtime. Patient taking differently: Inject 33-36 Units into the skin See admin instructions. 36 units once daily, and 33 units at bedtime 06/10/20   Rai, Ripudeep K, MD  Lidocaine 4 % PTCH Apply 1 patch topically 2 (two) times daily.  Apply to right hip in the morning remove at night    [provider]  losartan (COZAAR) 25 MG tablet Take 50 mg by mouth daily.    [provider]  Menthol (ICY HOT ADVANCED RELIEF) 7.5 % PTCH Apply 1 patch topically daily. Place on left arm every morning, remove at night    [provider]  Menthol, Topical Analgesic, (BIOFREEZE) 4 % GEL Apply 1 application topically in the morning, at noon, and at bedtime. To back and shoulders    [provider]  metFORMIN (GLUCOPHAGE-XR) 500 MG 24 hr tablet Take 1,000 mg by mouth in the morning and at bedtime.    [provider]  methenamine (HIPREX) 1 g tablet Take 1 g by mouth 2 (two) times daily. 02/22/21   [provider]  ondansetron (ZOFRAN-ODT) 4 MG disintegrating tablet Take 4 mg by mouth every 8 (eight) hours as needed for nausea or vomiting.    [provider]  pantoprazole (PROTONIX) 40 MG tablet Take 40 mg by mouth 2 (two) times daily. 10/19/20   [provider]  Polyethylene Glycol 400 (VISINE DRY EYE RELIEF) 1 % SOLN Place 2 drops into both eyes daily.    [provider]  polyvinyl alcohol (LIQUIFILM TEARS) 1.4 % ophthalmic solution Place 2 drops into both eyes in the morning and at bedtime.    [provider]  PSYLLIUM PO Take 0.8 g by mouth in the morning and at bedtime.    [provider]  senna-docusate (SENOKOT-S) 8.6-50 MG tablet Take 2 tablets by mouth 2 (two) times daily.    [provider]  sertraline (ZOLOFT) 50 MG tablet Take 25 mg by mouth at bedtime. 11/10/20   [provider]  Simethicone 180 MG CAPS Take 180 mg by mouth in the morning and at bedtime.    [provider]  traMADol (ULTRAM) 50 MG tablet Take 50 mg by mouth every 12 (twelve) hours as needed for moderate pain.    [provider]  traZODone (DESYREL) 50 MG tablet Take 50 mg by mouth at bedtime.    [provider]  vitamin B-12  (CYANOCOBALAMIN) 500 MCG tablet Take 1 tablet (500 mcg total) by mouth daily. 08/10/21   Pokhrel, Corrie Mckusick, MD  vitamin C (ASCORBIC ACID) 500 MG tablet Take 500 mg by mouth daily.    [provider]  Wound Dressings (TRIAD HYDROPHILIC WOUND DRESS EX) Apply 1 application topically daily. Surrounding skin to sacrum and right gluteus wound bed daily during dressing changes    [provider]      Allergies    Levofloxacin and Penicillins    Review of Systems   Review of Systems  Constitutional:  Negative for fever.   Physical Exam Updated Vital Signs BP Marland Kitchen)  126/53    Pulse 72    Resp (!) 27    SpO2 100%  Physical Exam Vitals and nursing note reviewed.  Constitutional:      General: He is not in acute distress.    Appearance: He is well-developed.  HENT:     Head: Normocephalic and atraumatic.     Right Ear: External ear normal.     Left Ear: External ear normal.  Eyes:     General: No scleral icterus.       Right eye: No discharge.        Left eye: No discharge.     Conjunctiva/sclera: Conjunctivae normal.  Neck:     Trachea: No tracheal deviation.  Cardiovascular:     Rate and Rhythm: Normal rate and regular rhythm.  Pulmonary:     Effort: Pulmonary effort is normal. No respiratory distress.     Breath sounds: Normal breath sounds. No stridor. No wheezing or rales.  Abdominal:     General: Bowel sounds are normal. There is no distension.     Palpations: Abdomen is soft.     Tenderness: There is no abdominal tenderness. There is no guarding or rebound.  Musculoskeletal:        General: No tenderness or deformity.     Cervical back: Neck supple.     Left lower leg: Edema present.  Skin:    General: Skin is warm and dry.     Findings: No rash.  Neurological:     Mental Status: He is alert. Mental status is at baseline.     Cranial Nerves: No cranial nerve deficit (no facial droop, extraocular movements intact, no slurred speech).     Sensory: No sensory  deficit.     Motor: No abnormal muscle tone or seizure activity.     Coordination: Coordination normal.     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    ED Results / Procedures / Treatments   Labs (all labs ordered are listed, but only abnormal results are displayed) Labs Reviewed  CBC WITH DIFFERENTIAL/PLATELET - Abnormal; Notable for the following components:      Result Value   Hemoglobin 11.3 (*)    HCT 36.3 (*)    MCH 25.9 (*)    RDW 15.7 (*)    All other components within normal limits  COMPREHENSIVE METABOLIC PANEL - Abnormal; Notable for the following components:   Glucose, Bld 200 (*)    Creatinine, Ser 0.59 (*)    Calcium 8.6 (*)    Total Protein 6.1 (*)    Albumin 2.6 (*)    All other components within normal limits  D-DIMER, QUANTITATIVE - Abnormal; Notable for the following components:   D-Dimer, Quant 0.74 (*)    All other components within normal limits  RESP PANEL BY RT-PCR (FLU A&B, COVID) ARPGX2  BRAIN NATRIURETIC PEPTIDE    EKG EKG Interpretation  Date/Time:  Monday November 01 2021 14:16:11 EST Ventricular Rate:  73 PR Interval:  191 QRS Duration: 102 QT Interval:  416 QTC Calculation: 459 R Axis:   42 Text Interpretation: Sinus rhythm Nonspecific T abnormalities, lateral leads No significant change since last tracing Confirmed by Dorie Rank [phone]) on 11/01/2021 2:41:51 PM  Radiology DG Chest Port 1 View  Result Date:  11/01/2021 CLINICAL DATA:  Shortness of breath.  Recent diagnosis of pneumonia. EXAM: PORTABLE CHEST 1 VIEW COMPARISON:  AP chest 08/05/2021, 12/21/2020 FINDINGS: Cardiac silhouette and mediastinal contours are within normal limits. Moderate calcification within the aortic arch unchanged. Lateral lower left lung heterogeneous and linear opacities appear  very similar to 08/05/2021 and 12/21/2020 prior radiographs. There is linear ground-glass density this region also on 03/08/2020 remote CT. This may be mildly increased from 08/05/2021, however there are baseline opacities in this region on prior radiographs. There also is mild interstitial thickening overlying the right costophrenic angle that appears increased from prior. No definite pleural effusion. No pneumothorax. Moderate multilevel degenerative bridging osteophytes of the thoracic spine. IMPRESSION: Linear and heterogeneous airspace opacities overlying the left-greater-than-right costophrenic angles appear mildly increased from 08/05/2021. There do appear to be chronic left basilar linear and heterogeneous opacities and multiple prior radiographs which may represent the patient's baseline and be scarring. Cannot exclude mild overlying left basilar and more mild possibly right basilar pneumonia. Electronically Signed   By: Yvonne Kendall M.D.   On: 11/01/2021 15:10    Procedures Procedures    Medications Ordered in ED Medications - No data to display  ED Course/ Medical Decision Making/ A&P                           Medical Decision Making Amount and/or Complexity of Data Reviewed Labs: ordered. Radiology: ordered. ECG/medicine tests: ordered.   Shortness of breath Patient with complaints of shortness of breath.  Recently diagnosed with pneumonia at the nursing facility.  Chest x-ray today does show signs of either chronic scarring atelectasis but Cannot exclude infiltrate.  Afebrile and does not have an oxygen requirement.  Patient does have elevated D-dimer although would age-adjusted normal.  However with the x-ray finding we will proceed with CT.  Leg swelling Patient not complain of any leg swelling but on my exam he has notable edema on his left lower extremity compared to the right.  He is at risk for DVT PE with his immobility.  We will proceed with CT scan  BNP is pending.   Covid flu pending  Care turned over to Dr Almyra Free        Final Clinical Impression(s) / ED Diagnoses pending   Dorie Rank, MD 11/01/21 1540

## 2021-11-01 NOTE — Discharge Instructions (Signed)
Call your primary care doctor or specialist as discussed in the next 2-3 days.   Return immediately back to the ER if:  Your symptoms worsen within the next 12-24 hours. You develop new symptoms such as new fevers, persistent vomiting, new pain, shortness of breath, or new weakness or numbness, or if you have any other concerns.  

## 2021-11-01 NOTE — Progress Notes (Signed)
LLE venous duplex has been completed.  Preliminary results messaged to Dr. Almyra Free via secure messenger.   Results can be found under chart review under CV PROC. 11/01/2021 4:38 PM Eilan Mcinerny RVT, RDMS

## 2021-11-01 NOTE — ED Notes (Signed)
[facility] called, 4th in line 

## 2021-11-10 ENCOUNTER — Telehealth: Payer: Self-pay | Admitting: Pharmacist

## 2021-11-10 NOTE — Chronic Care Management (AMB) (Signed)
Chronic Care Management Pharmacy Assistant   Name: Ammar Moffatt.  MRN: [id number] DOB: [birth year]  Reason for Encounter: Disease State / Hypertension and Diabetes Assessment Calls   Conditions to be addressed/monitored: HTN and DMII  Recent office visits:  None  Recent consult visits:  08/05/2021 Arlice Colt MD (neurology) - Patient was seen for neuromyelitis optica and additional issues. No medication changes. Follow up in 6 months.   Hospital visits: Patient was seen at [facility] ED on 11/01/2021 (7 hours) due to Community acquired pneumonia, unspecified laterality.   New?Medications Started at [facility] Discharge:?? No medications started Medication Changes at Hospital Discharge: No medication changes. Medications Discontinued at Hospital Discharge: No medications discontinued.  Medications that remain the same after Hospital Discharge:??  -All other medications will remain the same.     Admitted [facility] on 08/05/2021 due to catheter associated urinary tract infection. Discharge date was 08/10/2021.    New?Medications Started at [facility] Discharge:?? No medications started Medication Changes at Hospital Discharge: No medication changes. Medications Discontinued at Hospital Discharge: 100 MG capsule (MACROBID) Medications that remain the same after Hospital Discharge:??  -All other medications will remain the same.   Medications: Outpatient Encounter Medications as of 11/10/2021  Medication Sig Note   acetaminophen (TYLENOL) 325 MG tablet Take 2 tablets (650 mg total) by mouth every 6 (six) hours as needed for headache.    acetic acid 0.25 % irrigation Irrigate with 1 application as directed daily. For use on moist to dry gauze then applied to sacrum ulcer and right gluteus ulcer daily    Acidophilus Lactobacillus CAPS Take 1 capsule by mouth daily.    albuterol (PROVENTIL) (2.5 MG/3ML) 0.083% nebulizer solution Take 3 mLs (2.5  mg total) by nebulization every 6 (six) hours as needed for wheezing or shortness of breath (or anaphylaxis due to Rituxan infusion).    Amino Acids-Protein Hydrolys (FEEDING SUPPLEMENT, PRO-STAT SUGAR FREE 64,) LIQD Take 30 mLs by mouth in the morning and at bedtime.    antiseptic oral rinse (BIOTENE) LIQD 10 mLs by Mouth Rinse route in the morning, at noon, and at bedtime. Swish & Spit    aspirin EC 81 MG tablet Take 81 mg by mouth daily. Swallow whole.    atorvastatin (LIPITOR) 20 MG tablet Take 1 tablet (20 mg total) by mouth daily. (Patient taking differently: Take 20 mg by mouth at bedtime.) 08/06/2021: Last dose 11.14.22   Baclofen 5 MG TABS Take 10 mg by mouth in the morning and at bedtime.    Cyanocobalamin (VITAMIN B-12) 5000 MCG SUBL Place 5,000 mcg under the tongue every Monday.    dronabinol (MARINOL) 2.5 MG capsule Take 2.5 mg by mouth in the morning and at bedtime.    ferrous gluconate (FERGON) 324 MG tablet Take 324 mg by mouth every other day.    fexofenadine (ALLEGRA) 180 MG tablet Take 180 mg by mouth daily.    fluticasone (FLONASE) 50 MCG/ACT nasal spray Place 2 sprays into both nostrils 2 (two) times daily. (Patient taking differently: Place 1 spray into both nostrils daily.)    insulin aspart (NOVOLOG) 100 UNIT/ML injection Inject 0-15 Units into the skin every 4 (four) hours. Sliding scale  CBG 70 - 120: 0 units: CBG 121 - 150: 2 units; CBG 151 - 200: 3 units; CBG 201 - 250: 5 units; CBG 251 - 300: 8 units;CBG 301 - 350: 11 units; CBG 351 - 400: 15 units; CBG > 400 : 15  units and notify MD (Patient taking differently: Inject 0-15 Units into the skin in the morning and at bedtime. Sliding scale  If CBG>70 Call NP/PA, CBG 70 - 120: 0 units: CBG 121 - 150: 2 units; CBG 151 - 200: 3 units; CBG 201 - 250: 5 units; CBG 251 - 300: 8 units; CBG 301 - 350: 11 units; CBG 351 - 400: 15units and  CBG >400: 15 Units, CBG>400 Call MD)    insulin glargine (LANTUS) 100 unit/mL SOPN Inject 15  Units into the skin at bedtime. (Patient taking differently: Inject 33-36 Units into the skin See admin instructions. 36 units once daily, and 33 units at bedtime) 08/06/2021: Last dose 11.15.22   Lidocaine 4 % PTCH Apply 1 patch topically 2 (two) times daily. Apply to right hip in the morning remove at night    losartan (COZAAR) 25 MG tablet Take 50 mg by mouth daily.    Menthol (ICY HOT ADVANCED RELIEF) 7.5 % PTCH Apply 1 patch topically daily. Place on left arm every morning, remove at night    Menthol, Topical Analgesic, (BIOFREEZE) 4 % GEL Apply 1 application topically in the morning, at noon, and at bedtime. To back and shoulders    metFORMIN (GLUCOPHAGE-XR) 500 MG 24 hr tablet Take 1,000 mg by mouth in the morning and at bedtime.    methenamine (HIPREX) 1 g tablet Take 1 g by mouth 2 (two) times daily.    ondansetron (ZOFRAN-ODT) 4 MG disintegrating tablet Take 4 mg by mouth every 8 (eight) hours as needed for nausea or vomiting.    pantoprazole (PROTONIX) 40 MG tablet Take 40 mg by mouth 2 (two) times daily.    Polyethylene Glycol 400 (VISINE DRY EYE RELIEF) 1 % SOLN Place 2 drops into both eyes daily.    polyvinyl alcohol (LIQUIFILM TEARS) 1.4 % ophthalmic solution Place 2 drops into both eyes in the morning and at bedtime.    PSYLLIUM PO Take 0.8 g by mouth in the morning and at bedtime.    senna-docusate (SENOKOT-S) 8.6-50 MG tablet Take 2 tablets by mouth 2 (two) times daily.    sertraline (ZOLOFT) 50 MG tablet Take 25 mg by mouth at bedtime. 08/06/2021: Last dose 11.14.22   Simethicone 180 MG CAPS Take 180 mg by mouth in the morning and at bedtime.    traMADol (ULTRAM) 50 MG tablet Take 50 mg by mouth every 12 (twelve) hours as needed for moderate pain.    traZODone (DESYREL) 50 MG tablet Take 50 mg by mouth at bedtime. 08/06/2021: Last dose 11.14.22   vitamin B-12 (CYANOCOBALAMIN) 500 MCG tablet Take 1 tablet (500 mcg total) by mouth daily.    vitamin C (ASCORBIC ACID) 500 MG tablet  Take 500 mg by mouth daily.    Wound Dressings (TRIAD HYDROPHILIC WOUND DRESS EX) Apply 1 application topically daily. Surrounding skin to sacrum and right gluteus wound bed daily during dressing changes    No facility-administered encounter medications on file as of 11/10/2021.  Fill History: ALBUTEROL SUL 2.5 MG/3 ML SOLN 09/06/2021 6   ATENOLOL 25 MG TABLET 10/02/2021 30   ATORVASTATIN 20 MG TABLET 10/13/2021 30   AZELASTINE 0.15% NASAL SPRAY 07/09/2021 25   DRONABINOL 2.5 MG CAPSULE 10/19/2021 30   ALLERGY RELIEF 50 MCG SPRAY 07/29/2021 30   GABAPENTIN 100 MG CAPSULE 10/25/2021 30   GENTAMICIN 0.1% CREAM 10/28/2021 14   LANTUS SOLOSTAR 100 UNIT/ML 10/25/2021 25   LOSARTAN POTASSIUM 50 MG TAB 10/18/2021 30  PANTOPRAZOLE SOD DR 40 MG TAB 11/09/2021 30   JANUMET XR 50-1,000 MG TABLET 11/05/2021 30   10/25/2021 7   SERTRALINE HCL 25 MG TABLET 10/28/2021 30   METFORMIN HCL ER 500 MG TABLET 09/06/2021 30   TRAZODONE 50 MG TABLET 10/06/2021 30    Reviewed chart prior to disease state call. Spoke with patient regarding BP  Recent Office Vitals: BP Readings from Last 3 Encounters:  11/01/21 (!) 112/50  08/10/21 (!) 163/65  03/24/21 137/68   Pulse Readings from Last 3 Encounters:  11/01/21 66  08/10/21 61  03/24/21 85    Wt Readings from Last 3 Encounters:  11/01/21 190 lb (86.2 kg)  08/05/21 200 lb (90.7 kg)  03/03/21 182 lb 15.7 oz (83 kg)     Kidney Function Lab Results  Component Value Date/Time   CREATININE 0.59 (L) 11/01/2021 02:32 PM   CREATININE 0.47 (L) 08/09/2021 02:00 AM   CREATININE 0.26 (L) 07/23/2020 10:52 AM   CREATININE 0.87 04/15/2016 11:33 AM   GFR 82.18 03/18/2015 08:33 AM   GFRNONAA >60 11/01/2021 02:32 PM   GFRNONAA 87 04/15/2016 11:33 AM   GFRAA >60 06/10/2020 04:31 AM   GFRAA >89 04/15/2016 11:33 AM    BMP Latest Ref Rng & Units 11/01/2021 08/09/2021 08/08/2021  Glucose 70 - 99 mg/dL 200(H) 285(H) 281(H)  BUN 8 - 23 mg/dL 12 7(L)  11  Creatinine 0.61 - 1.24 mg/dL 0.59(L) 0.47(L) 0.45(L)  BUN/Creat Ratio 6 - 22 (calc) - - -  Sodium 135 - 145 mmol/L 135 134(L) 135  Potassium 3.5 - 5.1 mmol/L 3.9 3.5 3.9  Chloride 98 - 111 mmol/L 101 104 105  CO2 22 - 32 mmol/L 24 23 26   Calcium 8.9 - 10.3 mg/dL 8.6(L) 8.3(L) 8.2(L)     Recent Relevant Labs: Lab Results  Component Value Date/Time   HGBA1C 7.1 (H) 08/06/2021 05:30 AM   HGBA1C 8.2 (H) 11/16/2020 11:23 AM   MICROALBUR 4.2 (H) 03/18/2015 08:33 AM   MICROALBUR 1.7 01/09/2013 09:49 AM    Kidney Function Lab Results  Component Value Date/Time   CREATININE 0.59 (L) 11/01/2021 02:32 PM   CREATININE 0.47 (L) 08/09/2021 02:00 AM   CREATININE 0.26 (L) 07/23/2020 10:52 AM   CREATININE 0.87 04/15/2016 11:33 AM   GFR 82.18 03/18/2015 08:33 AM   GFRNONAA >60 11/01/2021 02:32 PM   GFRNONAA 87 04/15/2016 11:33 AM   GFRAA >60 06/10/2020 04:31 AM   GFRAA >89 04/15/2016 11:33 AM    Current antihypertensive regimen:  Losartan 25 mg two tablets daily   Current antihyperglycemic regimen:  Novolog 100 un/ml use sliding scale Lantus 100 un/ml 36 units once daily, and 33 units at bedtime Metformin 500 mg 2 tablets twice daily   Note:   Patient is in [facility] home due to a having contracted a spinal infection that has left him a quadriplegic. The nurses are checking blood pressures and blood sugars multiple times daily multiple times daily.  Patient is hoping he will be able to return home in the future.   Care Gaps: AWV - message sent to Ramond Craver Last BP - 112/50 on 11/01/2021 Last A1C - 7.1 on 08/06/2021 Shingrix - never done Covid booster - overdue Eye exam - overdue Foot exam - overdue  Star Rating Drugs: Lipitor 20 mg - last filled 10/13/2021 90 DS at [facility] Losartan 25 mg - last filled 10/18/2021 90 DS at [facility] Metformin 500 mg - last filled 09/06/2021 30 DS at [facility]  [place]  Clinical Pharmacist Assistant [phone]

## 2022-01-04 ENCOUNTER — Telehealth: Payer: Self-pay | Admitting: Neurology

## 2022-01-04 ENCOUNTER — Encounter: Payer: Self-pay | Admitting: *Deleted

## 2022-01-04 NOTE — Telephone Encounter (Signed)
Pt's wife called stating that she is needing a letter with office notes from 05/06/20-present stating why the pt is needing to receive transportation from a non emergency ambulance for him to be able to get to his appointments due to him being a quadriplegic. If no one answers wife states it is ok to leave a VM. Please advise. ?

## 2022-01-26 ENCOUNTER — Telehealth: Payer: Self-pay | Admitting: Neurology

## 2022-01-26 DIAGNOSIS — G360 Neuromyelitis optica [Devic]: Secondary | ICD-10-CM

## 2022-01-26 NOTE — Telephone Encounter (Signed)
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

## 2022-01-31 ENCOUNTER — Other Ambulatory Visit (HOSPITAL_COMMUNITY): Payer: Self-pay | Admitting: Family Medicine

## 2022-01-31 DIAGNOSIS — Z931 Gastrostomy status: Secondary | ICD-10-CM

## 2022-02-03 ENCOUNTER — Ambulatory Visit (INDEPENDENT_AMBULATORY_CARE_PROVIDER_SITE_OTHER): Payer: Medicare Other | Admitting: Neurology

## 2022-02-03 ENCOUNTER — Encounter: Payer: Self-pay | Admitting: Neurology

## 2022-02-03 DIAGNOSIS — G373 Acute transverse myelitis in demyelinating disease of central nervous system: Secondary | ICD-10-CM | POA: Diagnosis not present

## 2022-02-03 DIAGNOSIS — G360 Neuromyelitis optica [Devic]: Secondary | ICD-10-CM

## 2022-02-03 DIAGNOSIS — G8250 Quadriplegia, unspecified: Secondary | ICD-10-CM | POA: Diagnosis not present

## 2022-02-03 NOTE — Progress Notes (Signed)
Virtual Visit via Telephone Note  I connected with Letcher Schweikert. on 02/03/22 at  1:00 PM EDT by telephone and verified that I am speaking with the correct person using two identifiers.  Location: Patient: In skilled nursing facility Provider: Office   I discussed the limitations, risks, security and privacy concerns of performing an evaluation and management service by telephone and the availability of in person appointments. I also discussed with the patient that there may be a patient responsible charge related to this service. The patient expressed understanding and agreed to proceed.   History of Present Illness: He continues on Rituxan for his at the [facility].  He has tolerated well.  We discussed that Rituxan can increase the risk of infection.  Early on with treatment he had an active sacral decubitus but it has continued to improve and heal.  He gets urinary tract infections periodically.  Late last year, we checked IgG and IgM and the values were fine.  The NMO antibody is now back in the normal range  He is currently living in a skilled nursing facility.  He and his family are in the process of trying to get him back to his home.    His neurologic status has been stable.  He has mild to moderate weakness in the hands and the left arm and no strength in the legs.  He does not note much difficulty with numbness.  He has a neurogenic bladder.  He has no visual problems.   For the bowel, he has has an ileostomy.  He sees urology and has a Foley catheter.  He has had some UTIs requiring antibiotics and frequent visits to wound care.  He feels he is doing much better.  I reviewed some notes from wound management and it does appear like he is doing better with main lesion.  He has had some other smaller stones but they did not develop into significant ulcers.    He will be seeing ophthalmology 02/15/2022/   He has no new vision issues but needs new glasses..  He has not had optic  neuritis.  [facility] HIstory: On March 08, 2020, while sitting at church, he started to have intense pain in his chest.  He was concerned about an MI and drove to his nearby house.   He continued to feel poorly and his wife called an ambulance.   He went to [facility] ED.and EKG was fine.  While in the ED, he started to experience numbness first in his right leg then left leg and then in the hands.     MRIs of the spine were initially felt to be non-contributary.   Initially a spinal stroke was suspected.   For one day, he had SOB and went to the ICU.   Anti-NMO Abs returned positive.   He received 5 day course of IV Solumedrol and 5 days IVIg.    Rituxan was infused 03/24/2020 and again 2 weeks later. He went to the Rehab floor and started to have some improvement.  Upon discharge, he went to skilled nursing facility.  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  Follow Up Instructions: I discussed the assessment and treatment  plan with the patient. The patient was provided an opportunity to ask questions and all were answered. The patient agreed with the plan and demonstrated an understanding of the instructions.   The patient was advised to call back or seek an in-person evaluation if the symptoms worsen or if the condition fails to improve as anticipated.  I provided 23 minutes of non-face-to-face time during this encounter.   Britt Bottom, MD

## 2022-02-07 ENCOUNTER — Telehealth: Payer: Self-pay | Admitting: *Deleted

## 2022-02-07 NOTE — Telephone Encounter (Signed)
Gave the following printed update to infusion suite from Dr. Felecia Shelling: "Please let the infusion center know that he should have his next rituximab double infusion in June so that they can get him scheduled. IgG was normal December 2022"

## 2022-02-10 ENCOUNTER — Ambulatory Visit (HOSPITAL_COMMUNITY)
Admission: RE | Admit: 2022-02-10 | Discharge: 2022-02-10 | Disposition: A | Discharge status: Home / Self Care | Payer: Medicare Other | Source: Ambulatory Visit | Attending: Family Medicine | Admitting: Family Medicine

## 2022-02-10 DIAGNOSIS — Z431 Encounter for attention to gastrostomy: Secondary | ICD-10-CM | POA: Insufficient documentation

## 2022-02-10 DIAGNOSIS — R1310 Dysphagia, unspecified: Secondary | ICD-10-CM | POA: Diagnosis not present

## 2022-02-10 DIAGNOSIS — Z931 Gastrostomy status: Secondary | ICD-10-CM

## 2022-02-10 HISTORY — PX: IR GASTROSTOMY TUBE REMOVAL: IMG5492

## 2022-02-10 NOTE — Procedures (Signed)
Pt's balloon retention gastrostomy tube  was removed today without immediate complications. Gauze dressing applied to site. EBL < 2 cc.

## 2022-03-14 ENCOUNTER — Other Ambulatory Visit: Payer: Self-pay | Admitting: Physician Assistant

## 2022-03-14 DIAGNOSIS — R627 Adult failure to thrive: Secondary | ICD-10-CM

## 2022-03-18 ENCOUNTER — Telehealth (HOSPITAL_COMMUNITY): Payer: Self-pay | Admitting: Radiology

## 2022-03-18 NOTE — Telephone Encounter (Signed)
Tried to return a call to [facility] at [facility] to have pt scheduled for IR eval. No answer and no VM. JM

## 2022-03-23 ENCOUNTER — Other Ambulatory Visit: Payer: Self-pay | Admitting: Physician Assistant

## 2022-03-23 DIAGNOSIS — R627 Adult failure to thrive: Secondary | ICD-10-CM

## 2022-03-24 ENCOUNTER — Ambulatory Visit (HOSPITAL_COMMUNITY)
Admission: RE | Admit: 2022-03-24 | Discharge: 2022-03-24 | Disposition: A | Discharge status: Home / Self Care | Payer: Medicare Other | Source: Ambulatory Visit | Attending: Physician Assistant | Admitting: Physician Assistant

## 2022-03-24 DIAGNOSIS — G8220 Paraplegia, unspecified: Secondary | ICD-10-CM | POA: Insufficient documentation

## 2022-03-24 DIAGNOSIS — R627 Adult failure to thrive: Secondary | ICD-10-CM | POA: Diagnosis present

## 2022-03-24 MED ORDER — LIDOCAINE VISCOUS HCL 2 % MT SOLN
OROMUCOSAL | Status: AC
  Filled 2022-03-24: qty 15

## 2022-03-24 NOTE — Progress Notes (Signed)
Supervising Physician: Brent [name]  Patient Status:  [name] Outpatient  Chief Complaint:  Rod Can site  Brief History:  Brent [name] is a 76 yo male who was diagnosed with transverse myelitis in July of 2021. He is paraplegic.  05/25/20 CT neck: Mild asymmetric thickening of the left-sided oropharyngeal soft tissues without a discrete mass.  He underwent  laparoscopic loop sigmoid colostomy,  laparoscopic gastrostomy tube placement, lysis of adhesions by Dr. Bobbye [name] on 06/04/2020.  The tube placed for dysphagia and the loop colostomy was to facilitate healing of a decubitus ulcer.   He had his tube removed by Brent [name] on Feb 10, 2022.  Since that time the patient has had leakage from the stoma without healing.  He is here today for evaluation of the stoma.  Allergies: Levofloxacin and Penicillins  Medications: Prior to Admission medications   Medication Sig Start Date End Date Taking? Authorizing Provider  acetaminophen (TYLENOL) 325 MG tablet Take 2 tablets (650 mg total) by mouth every 6 (six) hours as needed for headache. 04/24/20   Love, Ivan Anchors, [name]  acetic acid 0.25 % irrigation Irrigate with 1 application as directed daily. For use on moist to dry gauze then applied to sacrum ulcer and right gluteus ulcer daily    [provider]  Acidophilus Lactobacillus CAPS Take 1 capsule by mouth daily.    [provider]  albuterol (PROVENTIL) (2.5 MG/3ML) 0.083% nebulizer solution Take 3 mLs (2.5 mg total) by nebulization every 6 (six) hours as needed for wheezing or shortness of breath (or anaphylaxis due to Rituxan infusion). 08/10/21   Pokhrel, Corrie Mckusick, MD  Amino Acids-Protein Hydrolys (FEEDING SUPPLEMENT, PRO-STAT SUGAR FREE 64,) LIQD Take 30 mLs by mouth in the morning and at bedtime.    [provider]  antiseptic oral rinse (BIOTENE) LIQD 10 mLs by Mouth Rinse route in the morning, at noon, and at bedtime. Swish & Spit    [provider]  aspirin EC 81 MG tablet Take 81 mg by mouth daily. Swallow whole.    [provider]  atorvastatin (LIPITOR) 20 MG tablet Take 1 tablet (20 mg total) by mouth daily. Patient taking differently: Take 20 mg by mouth at bedtime. 03/26/20   Shelly Coss, MD  Baclofen 5 MG TABS Take 10 mg by mouth in the morning and at bedtime. 02/25/21   [provider]  Cyanocobalamin (VITAMIN B-12) 5000 MCG SUBL Place 5,000 mcg under the tongue every Monday.    [provider]  dronabinol (MARINOL) 2.5 MG capsule Take 2.5 mg by mouth in the morning and at bedtime. 01/28/21   [provider]  ferrous gluconate (FERGON) 324 MG tablet Take 324 mg by mouth every other day.    [provider]  fexofenadine (ALLEGRA) 180 MG tablet Take 180 mg by mouth daily.    [provider]  fluticasone (FLONASE) 50 MCG/ACT nasal spray Place 2 sprays into both nostrils 2 (two) times daily. Patient taking differently: Place 1 spray into both nostrils daily. 04/24/20   Love, Ivan Anchors, [name]  insulin aspart (NOVOLOG) 100 UNIT/ML injection Inject 0-15 Units into the skin every 4 (four) hours. Sliding scale  CBG 70 - 120: 0 units: CBG 121 - 150: 2 units; CBG 151 - 200: 3 units; CBG 201 - 250: 5 units; CBG 251 - 300: 8 units;CBG 301 - 350: 11 units; CBG 351 - 400: 15 units; CBG > 400 : 15 units and notify MD  Patient taking differently: Inject 0-15 Units into the skin in the morning and at bedtime. Sliding scale  If CBG>70 Call NP/PA, CBG 70 - 120: 0 units: CBG 121 - 150: 2 units; CBG 151 - 200: 3 units; CBG 201 - 250: 5 units; CBG 251 - 300: 8 units; CBG 301 - 350: 11 units; CBG 351 - 400: 15units and  CBG >400: 15 Units, CBG>400 Call MD 06/10/20   Rai, Ripudeep K, MD  insulin glargine (LANTUS) 100 unit/mL SOPN Inject 15 Units into the skin at bedtime. Patient taking differently: Inject 33-36 Units into the skin See admin instructions. 36 units once daily, and 33 units at bedtime  06/10/20   Rai, Ripudeep K, MD  Lidocaine 4 % PTCH Apply 1 patch topically 2 (two) times daily. Apply to right hip in the morning remove at night    [provider]  losartan (COZAAR) 25 MG tablet Take 50 mg by mouth daily.    [provider]  Menthol (ICY HOT ADVANCED RELIEF) 7.5 % PTCH Apply 1 patch topically daily. Place on left arm every morning, remove at night    [provider]  Menthol, Topical Analgesic, (BIOFREEZE) 4 % GEL Apply 1 application topically in the morning, at noon, and at bedtime. To back and shoulders    [provider]  metFORMIN (GLUCOPHAGE-XR) 500 MG 24 hr tablet Take 1,000 mg by mouth in the morning and at bedtime.    [provider]  methenamine (HIPREX) 1 g tablet Take 1 g by mouth 2 (two) times daily. 02/22/21   [provider]  ondansetron (ZOFRAN-ODT) 4 MG disintegrating tablet Take 4 mg by mouth every 8 (eight) hours as needed for nausea or vomiting.    [provider]  pantoprazole (PROTONIX) 40 MG tablet Take 40 mg by mouth 2 (two) times daily. 10/19/20   [provider]  Polyethylene Glycol 400 (VISINE DRY EYE RELIEF) 1 % SOLN Place 2 drops into both eyes daily.    [provider]  polyvinyl alcohol (LIQUIFILM TEARS) 1.4 % ophthalmic solution Place 2 drops into both eyes in the morning and at bedtime.    [provider]  PSYLLIUM PO Take 0.8 g by mouth in the morning and at bedtime.    [provider]  senna-docusate (SENOKOT-S) 8.6-50 MG tablet Take 2 tablets by mouth 2 (two) times daily.    [provider]  sertraline (ZOLOFT) 50 MG tablet Take 25 mg by mouth at bedtime. 11/10/20   [provider]  Simethicone 180 MG CAPS Take 180 mg by mouth in the morning and at bedtime.    [provider]  traMADol (ULTRAM) 50 MG tablet Take 50 mg by mouth every 12 (twelve) hours as needed for moderate pain.    [provider]  traZODone (DESYREL)  50 MG tablet Take 50 mg by mouth at bedtime.    [provider]  vitamin B-12 (CYANOCOBALAMIN) 500 MCG tablet Take 1 tablet (500 mcg total) by mouth daily. 08/10/21   Pokhrel, Corrie Mckusick, MD  vitamin C (ASCORBIC ACID) 500 MG tablet Take 500 mg by mouth daily.    [provider]  Wound Dressings (TRIAD HYDROPHILIC WOUND DRESS EX) Apply 1 application topically daily. Surrounding skin to sacrum and right gluteus wound bed daily during dressing changes    [provider]     Physical Exam Constitutional:      Appearance: Normal appearance.  HENT:     Head: Normocephalic  and atraumatic.  Pulmonary:     Effort: Pulmonary effort is normal. No respiratory distress.  Abdominal:     Comments: Stoma size is approximately 2 mm in circumference. There was some yellow debris which I cleared away with a gauze. There is active clear drainage (patient was drinking water prior to arrival).  I used Viscous lidocaine to anesthetize the stoma tissue, then I used 3 Silver nitrate sticks in hopes of facilitating healing of the tract.  Skin:    General: Skin is warm and dry.  Neurological:     General: No focal deficit present.     Mental Status: He is alert and oriented to person, place, and time.  Psychiatric:        Mood and Affect: Mood normal.        Behavior: Behavior normal.        Thought Content: Thought content normal.        Judgment: Judgment normal.    Imaging: No results found.  Labs:  CBC: Recent Labs    08/08/21 0104 08/09/21 0200 08/30/21 1115 11/01/21 1432  WBC 6.1 7.9 7.7 6.7  HGB 9.8* 10.6* 11.0* 11.3*  HCT 31.9* 33.9* 34.4* 36.3*  PLT 261 253 327 336    COAGS: Recent Labs    08/05/21 2341  INR 1.1  APTT 34    BMP: Recent Labs    08/07/21 0038 08/08/21 0104 08/09/21 0200 11/01/21 1432  NA 136 135 134* 135  K 3.3* 3.9 3.5 3.9  CL 107 105 104 101  CO2 '23 26 23 24  '$ GLUCOSE 200* 281* 285* 200*  BUN 6* 11 7* 12  CALCIUM 8.4* 8.2* 8.3*  8.6*  CREATININE 0.48* 0.45* 0.47* 0.59*  GFRNONAA >60 >60 >60 >60    LIVER FUNCTION TESTS: Recent Labs    08/05/21 2341 11/01/21 1432  BILITOT 0.6 0.5  AST 14* 24  ALT 13 18  ALKPHOS 75 64  PROT 6.1* 6.1*  ALBUMIN 2.6* 2.6*    Assessment and Plan:  Non-healing Gtube tract/stoma site after removal done May 25.  Viscous lidocaine to anesthetize the stoma tissue, then 3 Silver nitrate sticks used to cauterize the stoma and tract in hopes of facilitating healing.  Recommend NPO from now until overnight.  Recommend IVFs overnight to avoid dehydration since patient will be NPO.   There is concern for either gastric outlet obstruction or gastroparesis.  If stoma/tract does not begin to heal after Silver Nitrate and NPO status, recommend referral to GI for evaluation of gastric motility/obstruction.  Above recommendations were placed on the Nursing Home visit sheet. A copy of this note has been sent to the patient's PCP Dr. Jules Husbands.   Electronically Signed: Murrell Redden, [name] 03/24/2022, 1:40 PM    I spent a total of 25 Minutes at the the patient's bedside AND on the patient's hospital floor or unit, greater than 50% of which was counseling/coordinating care for evaluation of leaking Gtube site.

## 2022-03-25 ENCOUNTER — Telehealth: Payer: Self-pay | Admitting: Gastroenterology

## 2022-03-25 ENCOUNTER — Other Ambulatory Visit: Payer: Self-pay

## 2022-03-25 ENCOUNTER — Observation Stay (HOSPITAL_COMMUNITY): Payer: Medicare Other

## 2022-03-25 ENCOUNTER — Encounter (HOSPITAL_COMMUNITY): Payer: Self-pay | Admitting: Emergency Medicine

## 2022-03-25 ENCOUNTER — Emergency Department (HOSPITAL_COMMUNITY): Payer: Medicare Other

## 2022-03-25 ENCOUNTER — Observation Stay (HOSPITAL_COMMUNITY)
Admission: EM | Admit: 2022-03-25 | Discharge: 2022-03-26 | Disposition: A | Discharge status: Home / Self Care | Payer: Medicare Other | Attending: Internal Medicine | Admitting: Internal Medicine

## 2022-03-25 DIAGNOSIS — E860 Dehydration: Secondary | ICD-10-CM

## 2022-03-25 DIAGNOSIS — Z79899 Other long term (current) drug therapy: Secondary | ICD-10-CM | POA: Diagnosis not present

## 2022-03-25 DIAGNOSIS — Z7984 Long term (current) use of oral hypoglycemic drugs: Secondary | ICD-10-CM | POA: Diagnosis not present

## 2022-03-25 DIAGNOSIS — Z85828 Personal history of other malignant neoplasm of skin: Secondary | ICD-10-CM | POA: Insufficient documentation

## 2022-03-25 DIAGNOSIS — E1165 Type 2 diabetes mellitus with hyperglycemia: Secondary | ICD-10-CM | POA: Diagnosis not present

## 2022-03-25 DIAGNOSIS — K2950 Unspecified chronic gastritis without bleeding: Secondary | ICD-10-CM | POA: Insufficient documentation

## 2022-03-25 DIAGNOSIS — K317 Polyp of stomach and duodenum: Secondary | ICD-10-CM | POA: Diagnosis not present

## 2022-03-25 DIAGNOSIS — K316 Fistula of stomach and duodenum: Secondary | ICD-10-CM | POA: Diagnosis not present

## 2022-03-25 DIAGNOSIS — I10 Essential (primary) hypertension: Secondary | ICD-10-CM | POA: Diagnosis not present

## 2022-03-25 DIAGNOSIS — M86651 Other chronic osteomyelitis, right thigh: Secondary | ICD-10-CM | POA: Diagnosis not present

## 2022-03-25 DIAGNOSIS — Z794 Long term (current) use of insulin: Secondary | ICD-10-CM

## 2022-03-25 DIAGNOSIS — G360 Neuromyelitis optica [Devic]: Secondary | ICD-10-CM

## 2022-03-25 DIAGNOSIS — Z87891 Personal history of nicotine dependence: Secondary | ICD-10-CM | POA: Insufficient documentation

## 2022-03-25 DIAGNOSIS — K3189 Other diseases of stomach and duodenum: Secondary | ICD-10-CM

## 2022-03-25 DIAGNOSIS — G8220 Paraplegia, unspecified: Secondary | ICD-10-CM | POA: Diagnosis not present

## 2022-03-25 DIAGNOSIS — D3A8 Other benign neuroendocrine tumors: Principal | ICD-10-CM | POA: Insufficient documentation

## 2022-03-25 DIAGNOSIS — K311 Adult hypertrophic pyloric stenosis: Secondary | ICD-10-CM

## 2022-03-25 DIAGNOSIS — I2510 Atherosclerotic heart disease of native coronary artery without angina pectoris: Secondary | ICD-10-CM | POA: Diagnosis not present

## 2022-03-25 DIAGNOSIS — Z7982 Long term (current) use of aspirin: Secondary | ICD-10-CM | POA: Insufficient documentation

## 2022-03-25 DIAGNOSIS — M8668 Other chronic osteomyelitis, other site: Secondary | ICD-10-CM | POA: Insufficient documentation

## 2022-03-25 DIAGNOSIS — I252 Old myocardial infarction: Secondary | ICD-10-CM | POA: Insufficient documentation

## 2022-03-25 DIAGNOSIS — Z955 Presence of coronary angioplasty implant and graft: Secondary | ICD-10-CM | POA: Diagnosis not present

## 2022-03-25 DIAGNOSIS — R933 Abnormal findings on diagnostic imaging of other parts of digestive tract: Secondary | ICD-10-CM | POA: Diagnosis present

## 2022-03-25 DIAGNOSIS — Z931 Gastrostomy status: Secondary | ICD-10-CM | POA: Insufficient documentation

## 2022-03-25 DIAGNOSIS — R739 Hyperglycemia, unspecified: Secondary | ICD-10-CM

## 2022-03-25 LAB — COMPREHENSIVE METABOLIC PANEL
ALT: 14 U/L (ref 0–44)
AST: 16 U/L (ref 15–41)
Albumin: 3 g/dL — ABNORMAL LOW (ref 3.5–5.0)
Alkaline Phosphatase: 119 U/L (ref 38–126)
Anion gap: 13 (ref 5–15)
BUN: 12 mg/dL (ref 8–23)
CO2: 24 mmol/L (ref 22–32)
Calcium: 9.4 mg/dL (ref 8.9–10.3)
Chloride: 98 mmol/L (ref 98–111)
Creatinine, Ser: 0.57 mg/dL — ABNORMAL LOW (ref 0.61–1.24)
GFR, Estimated: >60 mL/min (ref >60)
Glucose, Bld: 349 mg/dL — ABNORMAL HIGH (ref 70–99)
Potassium: 4.1 mmol/L (ref 3.5–5.1)
Sodium: 135 mmol/L (ref 135–145)
Total Bilirubin: 0.4 mg/dL (ref 0.3–1.2)
Total Protein: 6.5 g/dL (ref 6.5–8.1)

## 2022-03-25 LAB — CBC WITH DIFFERENTIAL/PLATELET
Abs Immature Granulocytes: 0.06 10*3/uL (ref 0.00–0.07)
Basophils Absolute: 0 10*3/uL (ref 0.0–0.1)
Basophils Relative: 0 %
Eosinophils Absolute: 0.2 10*3/uL (ref 0.0–0.5)
Eosinophils Relative: 2 %
HCT: 38.7 % — ABNORMAL LOW (ref 39.0–52.0)
Hemoglobin: 12.3 g/dL — ABNORMAL LOW (ref 13.0–17.0)
Immature Granulocytes: 1 %
Lymphocytes Relative: 11 %
Lymphs Abs: 1.1 10*3/uL (ref 0.7–4.0)
MCH: 27 pg (ref 26.0–34.0)
MCHC: 31.8 g/dL (ref 30.0–36.0)
MCV: 85.1 fL (ref 80.0–100.0)
Monocytes Absolute: 0.8 10*3/uL (ref 0.1–1.0)
Monocytes Relative: 8 %
Neutro Abs: 7.8 10*3/uL — ABNORMAL HIGH (ref 1.7–7.7)
Neutrophils Relative %: 78 %
Platelets: 298 10*3/uL (ref 150–400)
RBC: 4.55 MIL/uL (ref 4.22–5.81)
RDW: 14.8 % (ref 11.5–15.5)
WBC: 10 10*3/uL (ref 4.0–10.5)
nRBC: 0 % (ref 0.0–0.2)

## 2022-03-25 LAB — URINALYSIS, ROUTINE W REFLEX MICROSCOPIC
Bilirubin Urine: NEGATIVE
Glucose, UA: >=500 mg/dL — AB
Ketones, ur: NEGATIVE mg/dL
Nitrite: NEGATIVE
Protein, ur: 100 mg/dL — AB
Specific Gravity, Urine: 1.029 (ref 1.005–1.030)
WBC, UA: >50 WBC/hpf — ABNORMAL HIGH (ref 0–5)
pH: 5 (ref 5.0–8.0)

## 2022-03-25 LAB — HEMOGLOBIN A1C
Hgb A1c MFr Bld: 9.3 % — ABNORMAL HIGH (ref 4.8–5.6)
Mean Plasma Glucose: 220.21 mg/dL

## 2022-03-25 LAB — CBG MONITORING, ED: Glucose-Capillary: 286 mg/dL — ABNORMAL HIGH (ref 70–99)

## 2022-03-25 LAB — LIPASE, BLOOD: Lipase: 29 U/L (ref 11–51)

## 2022-03-25 MED ORDER — LOSARTAN POTASSIUM 50 MG PO TABS: 50.0000 mg | ORAL_TABLET | Freq: Every day | ORAL | Status: DC

## 2022-03-25 MED ORDER — ACETAMINOPHEN 650 MG RE SUPP: 650.0000 mg | Freq: Four times a day (QID) | RECTAL | Status: DC | PRN

## 2022-03-25 MED ORDER — SERTRALINE HCL 25 MG PO TABS: 25.0000 mg | ORAL_TABLET | Freq: Every day | ORAL | Status: DC

## 2022-03-25 MED ORDER — TRAZODONE HCL 50 MG PO TABS
75.0000 mg | ORAL_TABLET | Freq: Every day | ORAL | Status: DC
  Administered 2022-03-25: 75 mg via ORAL
  Filled 2022-03-25: qty 2

## 2022-03-25 MED ORDER — ONDANSETRON HCL 4 MG PO TABS: 4.0000 mg | ORAL_TABLET | Freq: Four times a day (QID) | ORAL | Status: DC | PRN

## 2022-03-25 MED ORDER — ONDANSETRON HCL 4 MG/2ML IJ SOLN: 4.0000 mg | Freq: Four times a day (QID) | INTRAMUSCULAR | Status: DC | PRN

## 2022-03-25 MED ORDER — SODIUM CHLORIDE 0.9 % IV BOLUS
1000.0000 mL | Freq: Once | INTRAVENOUS | Status: AC
  Administered 2022-03-25: 1000 mL via INTRAVENOUS

## 2022-03-25 MED ORDER — LACTATED RINGERS IV SOLN: INTRAVENOUS | Status: DC

## 2022-03-25 MED ORDER — VITAMIN D 25 MCG (1000 UNIT) PO TABS
3000.0000 [IU] | ORAL_TABLET | Freq: Every day | ORAL | Status: DC
Start: 2022-03-26 — End: 2022-03-27

## 2022-03-25 MED ORDER — INSULIN GLARGINE-YFGN 100 UNIT/ML ~~LOC~~ SOLN
20.0000 [IU] | Freq: Every day | SUBCUTANEOUS | Status: DC
  Administered 2022-03-26: 20 [IU] via SUBCUTANEOUS
  Filled 2022-03-25 (×3): qty 0.2

## 2022-03-25 MED ORDER — FAMOTIDINE 20 MG PO TABS
20.0000 mg | ORAL_TABLET | Freq: Two times a day (BID) | ORAL | Status: DC
  Administered 2022-03-25: 20 mg via ORAL
  Filled 2022-03-25: qty 1

## 2022-03-25 MED ORDER — BACLOFEN 10 MG PO TABS
10.0000 mg | ORAL_TABLET | Freq: Three times a day (TID) | ORAL | Status: DC
  Administered 2022-03-25 – 2022-03-26 (×2): 10 mg via ORAL
  Filled 2022-03-25 (×2): qty 1

## 2022-03-25 MED ORDER — ENOXAPARIN SODIUM 40 MG/0.4ML IJ SOSY
40.0000 mg | PREFILLED_SYRINGE | INTRAMUSCULAR | Status: DC
  Administered 2022-03-25: 40 mg via SUBCUTANEOUS
  Filled 2022-03-25: qty 0.4

## 2022-03-25 MED ORDER — IOHEXOL 300 MG/ML  SOLN
100.0000 mL | Freq: Once | INTRAMUSCULAR | Status: AC | PRN
  Administered 2022-03-25: 100 mL via INTRAVENOUS

## 2022-03-25 MED ORDER — CEFAZOLIN SODIUM-DEXTROSE 1-4 GM/50ML-% IV SOLN
1.0000 g | Freq: Two times a day (BID) | INTRAVENOUS | Status: DC
  Filled 2022-03-25 (×2): qty 50

## 2022-03-25 MED ORDER — POLYVINYL ALCOHOL 1.4 % OP SOLN
1.0000 [drp] | Freq: Four times a day (QID) | OPHTHALMIC | Status: DC
Start: 2022-03-26 — End: 2022-03-27

## 2022-03-25 MED ORDER — ATENOLOL 50 MG PO TABS
25.0000 mg | ORAL_TABLET | Freq: Every day | ORAL | Status: DC
  Administered 2022-03-25: 25 mg via ORAL
  Filled 2022-03-25: qty 1

## 2022-03-25 MED ORDER — ACETAMINOPHEN 325 MG PO TABS
650.0000 mg | ORAL_TABLET | Freq: Four times a day (QID) | ORAL | Status: DC | PRN
  Administered 2022-03-25: 650 mg via ORAL
  Filled 2022-03-25: qty 2

## 2022-03-25 MED ORDER — INSULIN ASPART 100 UNIT/ML IJ SOLN
0.0000 [IU] | INTRAMUSCULAR | Status: DC
  Administered 2022-03-26: 3 [IU] via SUBCUTANEOUS
  Administered 2022-03-26: 2 [IU] via SUBCUTANEOUS
  Administered 2022-03-26: 3 [IU] via SUBCUTANEOUS

## 2022-03-25 MED ORDER — ATORVASTATIN CALCIUM 10 MG PO TABS
20.0000 mg | ORAL_TABLET | Freq: Every day | ORAL | Status: DC
  Administered 2022-03-25: 20 mg via ORAL
  Filled 2022-03-25: qty 2

## 2022-03-25 MED ORDER — KETOTIFEN FUMARATE 0.025 % OP SOLN
1.0000 [drp] | Freq: Two times a day (BID) | OPHTHALMIC | Status: DC
  Filled 2022-03-25: qty 5

## 2022-03-25 MED ORDER — FLUTICASONE PROPIONATE 50 MCG/ACT NA SUSP
2.0000 | Freq: Two times a day (BID) | NASAL | Status: DC
  Filled 2022-03-25: qty 16

## 2022-03-25 NOTE — Telephone Encounter (Signed)
Called and spoke with [name], NP. I relayed Dr. Loletha Carrow' recommendations and she will notify the patient of the plan for hospital admission and IR consult.

## 2022-03-25 NOTE — ED Provider Notes (Signed)
[name]   CSN: [id number] Arrival date & time: 03/25/22  1814     History  No chief complaint on file.   Brent Ferns. is a 76 y.o. male.  Pt is a 76 yo male with a pmhx significant for neuromyelitis optica and transverse myelitis resulting in quadriplegia.  Pt is taking rituximab for the NMOSD.  The pt also has a hx of CAD, HTN, DM2, s/p colostomy, neurogenic bladder with chronic indwelling suprapubic FC.  He had his g tube removed on 5/25 by IR.  Since then, he's had leakage from the stoma w/o healing. Pt went to IR yesterday to evaluate the stoma.  The IR PA placed silver nitrate in the stoma tissue to try to facilitate healing.  He  was npo last night and had ivfs over night.  IR was concerned about outlet obstruction vs delayed gastric emptying as the g tube sites usually heal quickly.  They recommend a GI fluoro exam with oral contrast or perform a gastric nuclear medicine study.    Per IR:  "If he does not have a gastric outlet obstruction or delayed gastric emptying, there are three options to consider:   1. Place post pyloric NG tube.  This would allow him to get nutrition while the tract heals. The tract may still be held open by Gastric secretions.  2. Placed a G tube back into the tract and not use it.  Unfortunately this will keep the tract patent but should control the leaking.  3. Consult surgery to ligate the tract.  This would be the most aggressive approach and I would like to avoid if possible."  Dr. Loletha Carrow (GI) told pt's facility to bring pt to the ED for admission for IVFs and further eval.  Pt said he has eaten a little today and it all comes out the stoma site about 1/2 hr after eating.  Pt denies any pain or n/v.        Home Medications Prior to Admission medications   Medication Sig Start Date End Date Taking? Authorizing Provider  acetaminophen (TYLENOL) 500 MG tablet Take 500 mg by mouth every  6 (six) hours as needed for moderate pain.   Yes [provider]  albuterol (PROVENTIL) (2.5 MG/3ML) 0.083% nebulizer solution Take 3 mLs (2.5 mg total) by nebulization every 6 (six) hours as needed for wheezing or shortness of breath (or anaphylaxis due to Rituxan infusion). 08/10/21  Yes Pokhrel, Laxman, MD  Alogliptin Benzoate 25 MG TABS Take 25 mg by mouth daily.   Yes [provider]  aspirin EC 81 MG tablet Take 81 mg by mouth daily. Swallow whole.   Yes [provider]  atenolol (TENORMIN) 25 MG tablet Take by mouth at bedtime.   Yes [provider]  atorvastatin (LIPITOR) 20 MG tablet Take 1 tablet (20 mg total) by mouth daily. Patient taking differently: Take 20 mg by mouth at bedtime. 03/26/20  Yes Shelly Coss, MD  azelastine (OPTIVAR) 0.05 % ophthalmic solution Place 1 drop into both eyes 2 (two) times daily.   Yes [provider]  baclofen (LIORESAL) 10 MG tablet Take 10 mg by mouth 3 (three) times daily.   Yes [provider]  cephALEXin (KEFLEX) 250 MG capsule Take 250 mg by mouth daily. contineous 10/02/21  Yes [provider]  ferrous gluconate (FERGON) 324 MG tablet Take 324 mg by mouth every other day.   Yes [provider]  fluticasone (FLONASE) 50 MCG/ACT nasal spray Place 2 sprays into both nostrils 2 (two) times daily. 04/24/20  Yes Love, Ivan Anchors, PA-C  insulin glargine (LANTUS) 100 unit/mL SOPN Inject 15 Units into the skin at bedtime. Patient taking differently: Inject 38 Units into the skin at bedtime. 06/10/20  Yes Rai, Ripudeep K, MD  Menthol, Topical Analgesic, (BIOFREEZE) 4 % GEL Apply 1 application  topically 2 (two) times daily. To back and shoulders   Yes [provider]  phenol (CHLORASEPTIC) 1.4 % LIQD Use as directed 2 sprays in the mouth or throat every 4 (four) hours as needed for throat irritation / pain.   Yes [provider]  Polyethyl Glycol-Propyl Glycol (GENTEAL TEARS  SEVERE DAY/NIGHT) 0.4-0.3 % GEL ophthalmic gel Place 1 Application into both eyes in the morning, at noon, in the evening, and at bedtime. 2 drops qid   Yes [provider]  vitamin B-12 (CYANOCOBALAMIN) 1000 MCG tablet Take 2,000 mcg by mouth daily.   Yes [provider]  acetic acid 0.25 % irrigation Irrigate with 1 application as directed daily. For use on moist to dry gauze then applied to sacrum ulcer and right gluteus ulcer daily    [provider]  Acidophilus Lactobacillus CAPS Take 1 capsule by mouth daily.    [provider]  Amino Acids-Protein Hydrolys (FEEDING SUPPLEMENT, PRO-STAT SUGAR FREE 64,) LIQD Take 30 mLs by mouth in the morning and at bedtime.    [provider]  antiseptic oral rinse (BIOTENE) LIQD 10 mLs by Mouth Rinse route in the morning, at noon, and at bedtime. Swish & Spit    [provider]  dronabinol (MARINOL) 2.5 MG capsule Take 2.5 mg by mouth in the morning and at bedtime. 01/28/21   [provider]  fexofenadine (ALLEGRA) 180 MG tablet Take 180 mg by mouth daily.    [provider]  insulin aspart (NOVOLOG) 100 UNIT/ML injection Inject 0-15 Units into the skin every 4 (four) hours. Sliding scale  CBG 70 - 120: 0 units: CBG 121 - 150: 2 units; CBG 151 - 200: 3 units; CBG 201 - 250: 5 units; CBG 251 - 300: 8 units;CBG 301 - 350: 11 units; CBG 351 - 400: 15 units; CBG > 400 : 15 units and notify MD Patient taking differently: Inject 0-15 Units into the skin in the morning and at bedtime. Sliding scale  If CBG>70 Call NP/PA, CBG 70 - 120: 0 units: CBG 121 - 150: 2 units; CBG 151 - 200: 3 units; CBG 201 - 250: 5 units; CBG 251 - 300: 8 units; CBG 301 - 350: 11 units; CBG 351 - 400: 15units and  CBG >400: 15 Units, CBG>400 Call MD 06/10/20   Rai, Ripudeep K, MD  Lidocaine 4 % PTCH Apply 1 patch topically 2 (two) times daily. Apply to right hip in the morning remove at night    [provider]   Menthol (ICY HOT ADVANCED RELIEF) 7.5 % PTCH Apply 1 patch topically daily. Place on left arm every morning, remove at night    [provider]  metFORMIN (GLUCOPHAGE-XR) 500 MG 24 hr tablet Take 1,000 mg by mouth in the morning and at bedtime.    [provider]  methenamine (HIPREX) 1 g tablet Take 1 g by mouth 2 (two) times daily. 02/22/21   [provider]  ondansetron (ZOFRAN-ODT) 4 MG disintegrating tablet Take 4 mg by mouth every 8 (eight) hours as needed for nausea or  vomiting.    [provider]  pantoprazole (PROTONIX) 40 MG tablet Take 40 mg by mouth 2 (two) times daily. 10/19/20   [provider]  Polyethylene Glycol 400 (VISINE DRY EYE RELIEF) 1 % SOLN Place 2 drops into both eyes daily.    [provider]  polyvinyl alcohol (LIQUIFILM TEARS) 1.4 % ophthalmic solution Place 2 drops into both eyes in the morning and at bedtime.    [provider]  PSYLLIUM PO Take 0.8 g by mouth in the morning and at bedtime.    [provider]  senna-docusate (SENOKOT-S) 8.6-50 MG tablet Take 2 tablets by mouth 2 (two) times daily.    [provider]  sertraline (ZOLOFT) 50 MG tablet Take 25 mg by mouth at bedtime. 11/10/20   [provider]  Simethicone 180 MG CAPS Take 180 mg by mouth in the morning and at bedtime.    [provider]  traMADol (ULTRAM) 50 MG tablet Take 50 mg by mouth every 12 (twelve) hours as needed for moderate pain.    [provider]  traZODone (DESYREL) 50 MG tablet Take 50 mg by mouth at bedtime.    [provider]  vitamin B-12 (CYANOCOBALAMIN) 500 MCG tablet Take 1 tablet (500 mcg total) by mouth daily. 08/10/21   Pokhrel, Corrie Mckusick, MD  vitamin C (ASCORBIC ACID) 500 MG tablet Take 500 mg by mouth daily.    [provider]  Wound Dressings (TRIAD HYDROPHILIC WOUND DRESS EX) Apply 1 application topically daily. Surrounding skin to sacrum and right gluteus  wound bed daily during dressing changes    [provider]      Allergies    Gabapentin, Levofloxacin, and Penicillins    Review of Systems   Review of Systems  Gastrointestinal:        Leakage from g-tube stoma site  All other systems reviewed and are negative.   Physical Exam Updated Vital Signs BP (!) 157/71   Pulse 71   Temp 98.3 F (36.8 C) (Oral)   Resp 16   SpO2 100%  Physical Exam Vitals and nursing note reviewed.  Constitutional:      Appearance: Normal appearance.  HENT:     Head: Normocephalic and atraumatic.     Right Ear: External ear normal.     Left Ear: External ear normal.     Nose: Nose normal.     Mouth/Throat:     Mouth: Mucous membranes are moist.     Pharynx: Oropharynx is clear.  Eyes:     Extraocular Movements: Extraocular movements intact.     Conjunctiva/sclera: Conjunctivae normal.     Pupils: Pupils are equal, round, and reactive to light.  Cardiovascular:     Rate and Rhythm: Normal rate and regular rhythm.     Pulses: Normal pulses.     Heart sounds: Normal heart sounds.  Pulmonary:     Effort: Pulmonary effort is normal.     Breath sounds: Normal breath sounds.  Abdominal:     Comments: G tube stoma site with slightly digested food in bag  Colostomy bag with some stool  Suprapubic cath  Musculoskeletal:     Cervical back: Normal range of motion.  Skin:    General: Skin is warm.  Neurological:     Mental Status: He is alert. Mental status is at baseline.     Comments: Weakness in both arms, but he's able to move them; paralysis bilateral lower legs  Psychiatric:  Mood and Affect: Mood normal.     ED Results / Procedures / Treatments   Labs (all labs ordered are listed, but only abnormal results are displayed) Labs Reviewed  CBC WITH DIFFERENTIAL/PLATELET - Abnormal; Notable for the following components:      Result Value   Hemoglobin 12.3 (*)    HCT 38.7 (*)    Neutro Abs 7.8 (*)    All other  components within normal limits  COMPREHENSIVE METABOLIC PANEL - Abnormal; Notable for the following components:   Glucose, Bld 349 (*)    Creatinine, Ser 0.57 (*)    Albumin 3.0 (*)    All other components within normal limits  LIPASE, BLOOD  URINALYSIS, ROUTINE W REFLEX MICROSCOPIC  HEMOGLOBIN A1C    EKG None  Radiology CT Abdomen Pelvis W Contrast  Result Date: 03/25/2022 CLINICAL DATA:  Bowel obstruction suspected. Sent from facility physician for G-tube insertion site sutured. EXAM: CT ABDOMEN AND PELVIS WITH CONTRAST TECHNIQUE: Multidetector CT imaging of the abdomen and pelvis was performed using the standard protocol following bolus administration of intravenous contrast. RADIATION DOSE REDUCTION: This exam was performed according to the departmental dose-optimization program which includes automated exposure control, adjustment of the mA and/or kV according to patient size and/or use of iterative reconstruction technique. CONTRAST:  153m OMNIPAQUE IOHEXOL 300 MG/ML  SOLN COMPARISON:  08/06/2021. FINDINGS: Lower chest: The heart is normal in size and there is a trace pericardial effusion. Multi-vessel coronary artery calcifications are noted. There are trace bilateral pleural effusions with atelectasis at the lung bases. Hepatobiliary: No focal liver abnormality is seen. Status post cholecystectomy. No biliary dilatation. Pancreas: Unremarkable. No pancreatic ductal dilatation or surrounding inflammatory changes. Spleen: Normal in size without focal abnormality. Adrenals/Urinary Tract: No adrenal nodule. There is redemonstration of a cyst in the upper pole of the left kidney with layering hyperdense material. An additional cyst is noted in the left kidney. Subcentimeter hypodensity is noted in the right kidney which is too small to further characterize. No definite renal calculus. Vascular calcifications are present at the renal hila bilaterally. No hydronephrosis. A suprapubic catheter is  noted. Stomach/Bowel: Percutaneous gastrostomy site without evidence of PEG tube. However, a tubular air-filled structure is noted in the gastric body. No bowel obstruction, free air, or pneumatosis. Scattered diverticula are present along the colon without evidence of diverticulitis. There is a left lower quadrant colostomy. The appendix is within normal limits. No bowel wall thickening or surrounding inflammatory changes. Vascular/Lymphatic: Aortic atherosclerosis. No enlarged abdominal or pelvic lymph nodes. Reproductive: Prostate is unremarkable. Other: Fat containing inguinal hernias are present bilaterally. No abdominopelvic ascites. Musculoskeletal: Gynecomastia is noted. There is a decubitus ulcer over the ischium on the right with bony erosions, similar in appearance to the prior exam. Degenerative changes are present in the thoracolumbar spine. No acute osseous abnormality. IMPRESSION: 1. No evidence of bowel obstruction or free air. 2. Percutaneous gastrostomy site without evidence of PEG tube. An air-fluid tubular structure is noted in the gastric body. Correlate clinically to exclude residual foreign body. 3. Diverticulosis without diverticulitis. 4. Decubitus ulcer over the ischium on the right with erosive bony changes, not significantly changed from the prior exam, possible chronic osteomyelitis. 5. Stable renal cysts. 6. Aortic atherosclerosis and coronary artery calcifications. 7. Remaining incidental findings as described above. Critical findings were reported to Dr. JIsla Penceat 8:23 p.m. Electronically Signed   By: LBrett FairyM.D.   On: 03/25/2022 20:25    Procedures Procedures  Medications Ordered in ED Medications  lactated ringers infusion (has no administration in time range)  insulin aspart (novoLOG) injection 0-9 Units (has no administration in time range)  sodium chloride 0.9 % bolus 1,000 mL (1,000 mLs Intravenous New Bag/Given 03/25/22 1845)  iohexol (OMNIPAQUE) 300  MG/ML solution 100 mL (100 mLs Intravenous Contrast Given 03/25/22 2001)    ED Course/ Medical Decision Making/ A&P                           Medical Decision Making Amount and/or Complexity of Data Reviewed Labs: ordered. Radiology: ordered.  Risk Prescription drug management. Decision regarding hospitalization.   This patient presents to the ED for concern of g tube leakage, this involves an extensive number of treatment options, and is a complaint that carries with it a high risk of complications and morbidity.  The differential diagnosis includes gastric outlet obstruction, delayed gastric emptying, sbo   Co morbidities that complicate the patient evaluation  neuromyelitis optica and transverse myelitis resulting in quadriplegia.  Pt is taking rituximab for the NMOSD.  The pt also has a hx of CAD, HTN, DM2, s/p colostomy, neurogenic bladder with chronic indwelling suprapubic FC   Additional history obtained:  Additional history obtained from epic chart review External records from outside source obtained and reviewed including EMS report   Lab Tests:  I Ordered, and personally interpreted labs.  The pertinent results include:  cbc with mild, chronic anemia (hgb 12.3); glucose elevated at 349   Imaging Studies ordered:  I ordered imaging studies including ct abd/pelvis  I independently visualized and interpreted imaging which showed  IMPRESSION:  1. No evidence of bowel obstruction or free air.  2. Percutaneous gastrostomy site without evidence of PEG tube. An  air-fluid tubular structure is noted in the gastric body. Correlate  clinically to exclude residual foreign body.  3. Diverticulosis without diverticulitis.  4. Decubitus ulcer over the ischium on the right with erosive bony  changes, not significantly changed from the prior exam, possible  chronic osteomyelitis.  5. Stable renal cysts.  6. Aortic atherosclerosis and coronary artery calcifications.  7.  Remaining incidental findings as described above.   I agree with the radiologist interpretation   Cardiac Monitoring:  The patient was maintained on a cardiac monitor.  I personally viewed and interpreted the cardiac monitored which showed an underlying rhythm of: nsr   Medicines ordered and prescription drug management:  I ordered medication including ivfs  for dehydration  Reevaluation of the patient after these medicines showed that the patient improved I have reviewed the patients home medicines and have made adjustments as needed   Test Considered:  ct   Critical Interventions:  ivfs   Consultations Obtained:  I requested consultation with the hospitalist (Dr. Alcario Drought),  and discussed lab and imaging findings as well as pertinent plan - he will admit. I sent a secure chat message to [facility] to let them know that pt did show up in the ED.  They can see pt tomorrow in consult.  Pt is stable.   Problem List / ED Course:  Stoma site leakage:  no evidence of sbo.  Pt given ivfs.  He will be admitted for further eval.   Reevaluation:  After the interventions noted above, I reevaluated the patient and found that they have :improved   Social Determinants of Health:  Lives in a snf   Dispostion:  After consideration of the diagnostic results  and the patients response to treatment, I feel that the patent would benefit from admission.          Final Clinical Impression(s) / ED Diagnoses Final diagnoses:  Dehydration  Gastric outlet obstruction    Rx / DC Orders ED Discharge Orders     None         Isla Pence, MD 03/25/22 2118

## 2022-03-25 NOTE — Telephone Encounter (Signed)
Received a return call from [name], NP. She states that IR is the one who recommended referral to GI. Truman Hayward stated that she was not able to provide much information and stated that they only placed the referral for IR. IR notes are available in Epic. I told her that I will have you review IR note from yesterday. Thanks

## 2022-03-25 NOTE — Telephone Encounter (Signed)
[facility] [phone]). I spoke with Dawn, LPN and relayed your recommendations as outlined below. I asked Dawn if she could tell me which PA referred the patient, she stated that she did not know. I asked her was the information not in the patient's chart and she stated that it goes to be scanned in and will be in the chart later. I asked Dawn who she would be sending this message to and she stated that she did not know what provider was there today. I provided Dawn with your cell and office #. I also gave her my name and the office #.   03/28/22 office appt cancelled.

## 2022-03-25 NOTE — Assessment & Plan Note (Signed)
Known osteomyelitis due to decubitus ulcer of R ischeium. Looks like he was most recently on chronic keflex as of Jan 1) Med rec pending 2) will switch what-ever ABx he is currently taking chronically to IV form for the moment (ie use ancef instead of keflex if still on keflex) just-in-case he isnt absorbing the PO form with the gastric / peg tube site issues. 3) Wound care evaluation for the decubitus ulcer.

## 2022-03-25 NOTE — Assessment & Plan Note (Addendum)
Will put on Sensitive SSI Q4H for the moment due to NPO status. Hold metformin Takes 38u lantus QHS at SNF, will put him on 20u QHS for the moment due to NPO status.

## 2022-03-25 NOTE — Progress Notes (Signed)
Pharmacy Antibiotic Note  Brent [name]. is a 76 y.o. male admitted on 03/25/2022 presenting with stoma issues, on chronic osteomyelitis on suppression dose Keflex '250mg'$  daily PTA via PEG on hold until leakage resolved.  Pharmacy has been consulted for low dose cefazolin to replace suppression dosed Keflex (last dose 7/7).  Plan: Cefazolin 1g IV every 12 hours Monitor renal function, stoma leakage and ability to transition back to Keflex     Temp (24hrs), Avg:98.3 F (36.8 C), Min:98.3 F (36.8 C), Max:98.3 F (36.8 C)  Recent Labs  Lab 03/25/22 1840  WBC 10.0  CREATININE 0.57*    CrCl cannot be calculated (Unknown ideal weight.).    Allergies  Allergen Reactions   Gabapentin Other (See Comments)    Unknown   Levofloxacin Rash   Penicillins Hives and Rash    Tolerated Unasyn, Augmentin, cefepime and cephalexin   Bertis Ruddy, PharmD Clinical Pharmacist ED Pharmacist Phone # [phone] 03/25/2022 9:55 PM

## 2022-03-25 NOTE — Telephone Encounter (Signed)
This patient scheduled for 03/28/2022 after a referral received yesterday from his long-term care facility.  Included records are that one page handwritten note indicating a "nonhealing G-tube tract" with question of gastric outlet obstruction as a potential reason for that.  Since I did not place this patient's G-tube and there is insufficient clinical information regarding why there is a concern for gastric outlet obstruction, it is not clear that a visit at this office is the appropriate setting to help this patient and his referring provider. I do not want this medically complex and debilitated patient to travel for an office visit if it is not clearly what he needs.  Cancel the office appointment and contact this patient's facility with a request that his provider call me at our office.  They can then provide more clinical information and I may be able to offer some advice on how to proceed if it is an issue I can manage. (The note accompanying the referral appears to been written by a PA, but it is difficult to read the name).  -HD

## 2022-03-25 NOTE — H&P (Signed)
History and Physical    Patient: Brent [name]. GHW:[id number] DOB: 11-07-1945 DOA: 03/25/2022 DOS: the patient was seen and examined on 03/25/2022 PCP: Raymondo Band, MD  Patient coming from: SNF  Chief Complaint: Non healing PEG site  HPI: Brent [name]. is a 76 y.o. male with medical history significant of paraplegia due to transverse myelitis, NMO on rituxan, HTN, DM2.  Pt has chronic osteomyelitis of ischium from decubitus ulcer.  Pt has colostomy.  Pt had peg tube placed in 2021.  Finally removed in May 2023.  Unfortunately PEG site has failed to heal.  He has significant leakage of anything he takes PO from the PEG site currently.  Saw IR yesterday (Dr. Dwaine Gale) who attempted to cauterize the tract with silver nitrate and keep pt NPO to see if it would heal up.  This didn't work.  Pt with significant drainage today.  Pt said he has eaten a little today and it all comes out the stoma site about 1/2 hr after eating.  Pt denies any pain or n/v.  Concern by Dr. Dwaine Gale and Dr. Loletha Carrow (GI) today of possible gastric outlet obstruction vs gastroparesis.  Pt sent in to ED with plans for medicine to admit and discuss further work up strategy (NM study vs fluoroscopy).  Review of Systems: As mentioned in the history of present illness. All other systems reviewed and are negative. Past Medical History:  Diagnosis Date   Allergy    CAD (coronary artery disease)    a. angioplasty of his RCA in 1990. b. bare metal stent placed in the RCA in 2000 followed by rotational atherectomy shortly after for stent restenosis. c. last cath was in 2012 showing stable moderate diffuse CAD. (70% mid LAD, 80% diagonal, 70% Ramus, 40% mid to distal RCA stent restenosis). d. Low risk nuc in 2015.   Diabetes mellitus    Diverticulosis    Elevated CK    Erectile dysfunction    Hemorrhoids    HTN (hypertension)    Hyperlipidemia    Hypertriglyceridemia    Malignant melanoma of left side of neck ([place])  10/25/2018   Myocardial infarction (HCC)    Obesity    OSA (obstructive sleep apnea)    Persistent disorder of initiating or maintaining sleep    Personal history of colonic polyps 02/05/2003   Past Surgical History:  Procedure Laterality Date   CHOLECYSTECTOMY     CORONARY ANGIOPLASTY     CORONARY STENT PLACEMENT     stenting of the right coronary artery with followup rotational  atherectomy. (3 stents placed)   FINGER SURGERY     right   FOOT SURGERY     right   INGUINAL HERNIA REPAIR     right   IR GASTROSTOMY TUBE REMOVAL  02/10/2022   IRRIGATION AND DEBRIDEMENT ABSCESS N/A 06/04/2020   Procedure: IRRIGATION AND DEBRIDEMENT SACRAL WOUND;  Surgeon: Jesusita Oka, MD;  Location: [facility];  Service: General;  Laterality: N/A;   LAPAROSCOPY N/A 06/04/2020   Procedure: LAPAROSCOPY ASSISTED COLOSTOMY, LAPAROSCOPY GASTROSTOMY;  Surgeon: Jesusita Oka, MD;  Location: [facility];  Service: General;  Laterality: N/A;   LYSIS OF ADHESION N/A 06/04/2020   Procedure: LYSIS OF ADHESION;  Surgeon: Jesusita Oka, MD;  Location: [facility];  Service: General;  Laterality: N/A;   melanoma removal     neck   ORTHOPEDIC SURGERY     foot right   rotator cuff surg     [place]  Social History:  reports that he quit smoking about 41 years ago. His smoking use included cigarettes. He has a 45.00 pack-year smoking history. He has never used smokeless tobacco. He reports that he does not currently use alcohol after a past usage of about 7.0 - 14.0 standard drinks of alcohol per week. He reports that he does not use drugs.  Allergies  Allergen Reactions   Gabapentin Other (See Comments)    Unknown   Levofloxacin Rash   Penicillins Hives and Rash    Tolerated Unasyn, Augmentin, cefepime and cephalexin    Family History  Problem Relation Age of Onset   Liver cancer Father    Lung cancer Father    Heart disease Father    Heart disease Sister    Lung cancer Mother    COPD Mother    Obesity Mother     Colon cancer Neg Hx    Stomach cancer Neg Hx    Pancreatic cancer Neg Hx     Prior to Admission medications   Medication Sig Start Date End Date Taking? Authorizing Provider  acetaminophen (TYLENOL) 500 MG tablet Take 500 mg by mouth every 6 (six) hours as needed for moderate pain.   Yes [provider]  albuterol (PROVENTIL) (2.5 MG/3ML) 0.083% nebulizer solution Take 3 mLs (2.5 mg total) by nebulization every 6 (six) hours as needed for wheezing or shortness of breath (or anaphylaxis due to Rituxan infusion). 08/10/21  Yes Pokhrel, Laxman, MD  Alogliptin Benzoate 25 MG TABS Take 25 mg by mouth daily.   Yes [provider]  aspirin EC 81 MG tablet Take 81 mg by mouth daily. Swallow whole.   Yes [provider]  atenolol (TENORMIN) 25 MG tablet Take by mouth at bedtime.   Yes [provider]  atorvastatin (LIPITOR) 20 MG tablet Take 1 tablet (20 mg total) by mouth daily. Patient taking differently: Take 20 mg by mouth at bedtime. 03/26/20  Yes Shelly Coss, MD  azelastine (OPTIVAR) 0.05 % ophthalmic solution Place 1 drop into both eyes 2 (two) times daily.   Yes [provider]  baclofen (LIORESAL) 10 MG tablet Take 10 mg by mouth 3 (three) times daily.   Yes [provider]  cephALEXin (KEFLEX) 250 MG capsule Take 250 mg by mouth daily. contineous 10/02/21  Yes [provider]  cholecalciferol (VITAMIN D3) 25 MCG (1000 UNIT) tablet Take 3,000 Units by mouth daily.   Yes [provider]  famotidine (PEPCID) 20 MG tablet Take 20 mg by mouth 2 (two) times daily.   Yes [provider]  ferrous gluconate (FERGON) 324 MG tablet Take 324 mg by mouth every other day.   Yes [provider]  fluticasone (FLONASE) 50 MCG/ACT nasal spray Place 2 sprays into both nostrils 2 (two) times daily. 04/24/20  Yes Love, Ivan Anchors, PA-C  insulin aspart (NOVOLOG) 100 UNIT/ML injection Inject 0-15 Units into the skin every 4 (four)  hours. Sliding scale  CBG 70 - 120: 0 units: CBG 121 - 150: 2 units; CBG 151 - 200: 3 units; CBG 201 - 250: 5 units; CBG 251 - 300: 8 units;CBG 301 - 350: 11 units; CBG 351 - 400: 15 units; CBG > 400 : 15 units and notify MD Patient taking differently: Inject 0-15 Units into the skin in the morning and at bedtime. Sliding scale  If CBG>70 Call NP/PA, CBG 70 - 120: 0 units: CBG 121 - 150: 2 units; CBG 151 - 200: 3 units;  CBG 201 - 250: 5 units; CBG 251 - 300: 8 units; CBG 301 - 350: 11 units; CBG 351 - 400: 15units and  CBG >400: 15 Units, CBG>400 Call MD 06/10/20  Yes Rai, Ripudeep K, MD  insulin glargine (LANTUS) 100 unit/mL SOPN Inject 15 Units into the skin at bedtime. Patient taking differently: Inject 38 Units into the skin at bedtime. 06/10/20  Yes Rai, Ripudeep K, MD  losartan (COZAAR) 50 MG tablet Take 50 mg by mouth daily.   Yes [provider]  Magnesium Oxide, Elemental, 400 MG TABS Take 400 mg by mouth 3 (three) times daily.   Yes [provider]  Menthol, Topical Analgesic, (BIOFREEZE) 4 % GEL Apply 1 application  topically 2 (two) times daily. To back and shoulders   Yes [provider]  metFORMIN (GLUCOPHAGE-XR) 500 MG 24 hr tablet Take 1,000 mg by mouth in the morning and at bedtime.   Yes [provider]  nystatin powder Apply 1 Application topically daily.   Yes [provider]  OxyCODONE HCl, Abuse Deter, (OXAYDO) 5 MG TABA Take 5 mg by mouth every 8 (eight) hours as needed (pain).   Yes [provider]  phenol (CHLORASEPTIC) 1.4 % LIQD Use as directed 2 sprays in the mouth or throat every 4 (four) hours as needed for throat irritation / pain.   Yes [provider]  Polyethyl Glycol-Propyl Glycol (GENTEAL TEARS SEVERE DAY/NIGHT) 0.4-0.3 % GEL ophthalmic gel Place 1 Application into both eyes in the morning, at noon, in the evening, and at bedtime. 2 drops qid   Yes [provider]  Pseudoeph-Bromphen-DM (ROBITUSSIN  ALLERGY/COUGH PO) Take 10 mLs by mouth every 6 (six) hours as needed (cough).   Yes [provider]  PSYLLIUM PO Take 0.8 g by mouth in the morning and at bedtime.   Yes [provider]  riTUXimab (RITUXAN) 100 MG/10ML injection Inject 10 mg into the vein See admin instructions. Once between 12th -13th of June, December   Yes [provider]  senna-docusate (SENOKOT-S) 8.6-50 MG tablet Take 2 tablets by mouth daily.   Yes [provider]  sertraline (ZOLOFT) 25 MG tablet Take 25 mg by mouth daily.   Yes [provider]  traZODone (DESYREL) 50 MG tablet Take 75 mg by mouth at bedtime.   Yes [provider]  vitamin B-12 (CYANOCOBALAMIN) 1000 MCG tablet Take 2,000 mcg by mouth daily.   Yes [provider]  vitamin C (ASCORBIC ACID) 500 MG tablet Take 500 mg by mouth daily.   Yes [provider]    Physical Exam: Vitals:   03/25/22 1822  BP: (!) 157/71  Pulse: 71  Resp: 16  Temp: 98.3 F (36.8 C)  TempSrc: Oral  SpO2: 100%   Constitutional: NAD, calm, comfortable Eyes: PERRL, lids and conjunctivae normal ENMT: Mucous membranes are moist. Posterior pharynx clear of any exudate or lesions.Normal dentition.  Neck: normal, supple, no masses, no thyromegaly Respiratory: clear to auscultation bilaterally, no wheezing, no crackles. Normal respiratory effort. No accessory muscle use.  Cardiovascular: Regular rate and rhythm, no murmurs / rubs / gallops. No extremity edema. 2+ pedal pulses. No carotid bruits.  Abdomen: G tube stoma site with slightly digested food in bag   Colostomy bag with some stool   Suprapubic cath  Musculoskeletal: no clubbing / cyanosis. No joint deformity upper and lower extremities. Good ROM, no contractures. Normal muscle tone.  Skin: no rashes, lesions, ulcers. No induration Neurologic: Paraplegia of  BLE, weakness in BUE but still able to move Psychiatric: Normal judgment and insight. Alert and  oriented x 3. Normal mood.   Data Reviewed:    CBC and BMP = hyperglycemic, albumin 3.0, otherwise not really remarkable.  CT AP w/o: IMPRESSION: 1. No evidence of bowel obstruction or free air. 2. Percutaneous gastrostomy site without evidence of PEG tube. An air-fluid tubular structure is noted in the gastric body. Correlate clinically to exclude residual foreign body. 3. Diverticulosis without diverticulitis. 4. Decubitus ulcer over the ischium on the right with erosive bony changes, not significantly changed from the prior exam, possible chronic osteomyelitis. 5. Stable renal cysts. 6. Aortic atherosclerosis and coronary artery calcifications. 7. Remaining incidental findings as described above.  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 ? Gastric outlet obstruction, ? Gastroparesis (presumably partial obstruction vs gastroparesis given that he still does have colostomy output). Also looks like he has perhaps small part of tip of prior PEG tube floating around stomach, not sure if that's clinically significant (doesn't look like its impacted at the duodenum or the cause of obstruction based on CT scan). Admitting to hospital for IVF to prevent dehydration. NPO except for sips with meds - anything he takes orally just comes out the PEG site / fistula. Will try to continue those chronic meds PO that cant be converted to IV, though I'm not really sure how much hes actually absorbing of them. Hold laxatives EDP put a G tube back into tract for the moment so leakage stops for now (see her note). Epic messages sent to Dr. Loletha Carrow and Dr. Dwaine Gale (these 2 recommended he go to ED in notes earlier today, presumably they are aware that he is here and being admitted as per their requests) and Dr. Havery Moros (on call for Dr. Loletha Carrow). Will let them decide how to next proceed.  Chronic  osteomyelitis of pelvic region, right ([place]) Known osteomyelitis due to decubitus ulcer of R ischeium. Looks like he was most recently on chronic keflex as of Jan 1) Med rec pending 2) will switch what-ever ABx he is currently taking chronically to IV form for the moment (ie use ancef instead of keflex if still on keflex) just-in-case he isnt absorbing the PO form with the gastric / peg tube site issues. 3) Wound care evaluation for the decubitus ulcer.  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[place]) Being managed by outpt neurology.  On rituxan.  Last got in June. Next due in Dec.  Controlled type 2 diabetes mellitus with hyperglycemia, with long-term current use of insulin ([place]) Will put on Sensitive SSI Q4H for the moment due to NPO status. Hold metformin Takes 38u lantus QHS at SNF, will put him on 20u QHS for the moment due to NPO status.  Paraplegia (HCC) Due to history of transverse myelitis.  Essential hypertension Med rec pending. Not really sure hes absorbing much of his PO meds though at this point given that anything he takes PO shortly comes out the non-healing PEG tube site. Will continue his PO BP meds though. Add PRN short acting IV if needed.      Advance Care Planning:   Code Status: Full Code  Consults: Epic message sent as described above to Drs. Mir, Danis, and Scientist, research (life sciences)  Family Communication: No family present  Severity of Illness: The appropriate patient status for this patient is OBSERVATION. Observation status is judged to be reasonable and necessary in order  to provide the required intensity of service to ensure the patient's safety. The patient's presenting symptoms, physical exam findings, and initial radiographic and laboratory data in the context of their medical condition is felt to place them at decreased risk for further clinical deterioration. Furthermore, it is anticipated that the patient will be medically stable for discharge from the hospital within 2 midnights of  admission.   Author: Etta Quill., DO 03/25/2022 9:44 PM  For on call review [email or web address].

## 2022-03-25 NOTE — Assessment & Plan Note (Addendum)
Med rec pending. Not really sure hes absorbing much of his PO meds though at this point given that anything he takes PO shortly comes out the non-healing PEG tube site. Will continue his PO BP meds though. Add PRN short acting IV if needed.

## 2022-03-25 NOTE — Telephone Encounter (Addendum)
Thank you for the update.  I was unaware he had seen interventional radiology as that did not come up in his Epic encounters, but I was able to find their note from yesterday.  Gareth Eagle, PA supervised by Dr. Sharen Heck Mir)  Given what I know of this patient's medical condition, debility and his leaking gastrostomy site, I am unable to answer the questions of gastric outlet obstruction or gastroparesis either endoscopically or radiographically. (It would seem to me that with a patent gastrostomy site, the best way to answer the question of GOO may be for IR to perform a Gastrografin study through the ostomy site, but of course I would have to leave that to their discretion)  Meanwhile, this medically complex  patient has been left n.p.o., putting him at risk for volume depletion, electrolyte derangement and further malnutrition.  [place], please contact the provider at his facility with my recommendation that he be admitted to our hospital system (either [facility] or [facility]).  I expect he would be admitted to the internal medicine service, and then could be seen in consultation by interventional radiology, surgery if needed and our GI consult service if needed.  Wilfrid Lund, MD

## 2022-03-25 NOTE — Assessment & Plan Note (Addendum)
Being managed by outpt neurology.  On rituxan.  Last got in June. Next due in Dec.

## 2022-03-25 NOTE — Assessment & Plan Note (Signed)
Due to history of transverse myelitis.

## 2022-03-25 NOTE — Telephone Encounter (Signed)
[place]. Truman Hayward was not available at this time. I left a message with a nurse to have her call me back. I provided them with my direct office #. Will await return call.

## 2022-03-25 NOTE — Assessment & Plan Note (Addendum)
-   Gastrocutaneous fistula due to non-healing PEG tube site despite removal in May. - s/p EGD on 7/8; no outlet obstruction - tentative plan for outpatient follow up with surgery to discuss further plans

## 2022-03-25 NOTE — ED Triage Notes (Signed)
Pt bib gcems from [facility], sent from facility's physician to get his G tube insertion site sutured. G tube was removed 6-7 weeks ago. Pt arrives with no complaints.   BP 174/78, P 78, RR 18, Spo2 97%

## 2022-03-26 ENCOUNTER — Encounter (HOSPITAL_COMMUNITY)
Admission: EM | Disposition: A | Discharge status: Home / Self Care | Payer: Self-pay | Source: Home / Self Care | Attending: Emergency Medicine

## 2022-03-26 ENCOUNTER — Encounter (HOSPITAL_COMMUNITY): Payer: Self-pay | Admitting: Internal Medicine

## 2022-03-26 ENCOUNTER — Observation Stay (HOSPITAL_COMMUNITY): Payer: Medicare Other | Admitting: Anesthesiology

## 2022-03-26 ENCOUNTER — Observation Stay (HOSPITAL_BASED_OUTPATIENT_CLINIC_OR_DEPARTMENT_OTHER): Payer: Medicare Other | Admitting: Anesthesiology

## 2022-03-26 DIAGNOSIS — E1151 Type 2 diabetes mellitus with diabetic peripheral angiopathy without gangrene: Secondary | ICD-10-CM | POA: Diagnosis not present

## 2022-03-26 DIAGNOSIS — K3189 Other diseases of stomach and duodenum: Secondary | ICD-10-CM

## 2022-03-26 DIAGNOSIS — K317 Polyp of stomach and duodenum: Secondary | ICD-10-CM

## 2022-03-26 DIAGNOSIS — D3A8 Other benign neuroendocrine tumors: Secondary | ICD-10-CM | POA: Diagnosis not present

## 2022-03-26 DIAGNOSIS — K316 Fistula of stomach and duodenum: Secondary | ICD-10-CM | POA: Diagnosis not present

## 2022-03-26 DIAGNOSIS — Z931 Gastrostomy status: Secondary | ICD-10-CM

## 2022-03-26 HISTORY — PX: BIOPSY: SHX5522

## 2022-03-26 HISTORY — PX: ESOPHAGOGASTRODUODENOSCOPY (EGD) WITH PROPOFOL: SHX5813

## 2022-03-26 LAB — BASIC METABOLIC PANEL
Anion gap: 8 (ref 5–15)
BUN: 9 mg/dL (ref 8–23)
CO2: 25 mmol/L (ref 22–32)
Calcium: 8.9 mg/dL (ref 8.9–10.3)
Chloride: 104 mmol/L (ref 98–111)
Creatinine, Ser: 0.54 mg/dL — ABNORMAL LOW (ref 0.61–1.24)
GFR, Estimated: >60 mL/min (ref >60)
Glucose, Bld: 202 mg/dL — ABNORMAL HIGH (ref 70–99)
Potassium: 3.7 mmol/L (ref 3.5–5.1)
Sodium: 137 mmol/L (ref 135–145)

## 2022-03-26 LAB — CBC
HCT: 32.8 % — ABNORMAL LOW (ref 39.0–52.0)
Hemoglobin: 10.5 g/dL — ABNORMAL LOW (ref 13.0–17.0)
MCH: 27 pg (ref 26.0–34.0)
MCHC: 32 g/dL (ref 30.0–36.0)
MCV: 84.3 fL (ref 80.0–100.0)
Platelets: 268 10*3/uL (ref 150–400)
RBC: 3.89 MIL/uL — ABNORMAL LOW (ref 4.22–5.81)
RDW: 14.9 % (ref 11.5–15.5)
WBC: 7.9 10*3/uL (ref 4.0–10.5)
nRBC: 0 % (ref 0.0–0.2)

## 2022-03-26 LAB — GLUCOSE, CAPILLARY
Glucose-Capillary: 164 mg/dL — ABNORMAL HIGH (ref 70–99)
Glucose-Capillary: 185 mg/dL — ABNORMAL HIGH (ref 70–99)
Glucose-Capillary: 244 mg/dL — ABNORMAL HIGH (ref 70–99)
Glucose-Capillary: 283 mg/dL — ABNORMAL HIGH (ref 70–99)

## 2022-03-26 LAB — CBG MONITORING, ED
Glucose-Capillary: 188 mg/dL — ABNORMAL HIGH (ref 70–99)
Glucose-Capillary: 211 mg/dL — ABNORMAL HIGH (ref 70–99)
Glucose-Capillary: 247 mg/dL — ABNORMAL HIGH (ref 70–99)

## 2022-03-26 SURGERY — ESOPHAGOGASTRODUODENOSCOPY (EGD) WITH PROPOFOL
Anesthesia: Monitor Anesthesia Care

## 2022-03-26 MED ORDER — LACTATED RINGERS IV SOLN: INTRAVENOUS | Status: DC

## 2022-03-26 MED ORDER — SODIUM CHLORIDE 0.9 % IV SOLN
INTRAVENOUS | Status: DC
Start: 2022-03-26 — End: 2022-03-26

## 2022-03-26 MED ORDER — PROPOFOL 10 MG/ML IV BOLUS
INTRAVENOUS | Status: DC | PRN
  Administered 2022-03-26: 10 mg via INTRAVENOUS
  Administered 2022-03-26: 20 mg via INTRAVENOUS

## 2022-03-26 MED ORDER — LIDOCAINE 2% (20 MG/ML) 5 ML SYRINGE
INTRAMUSCULAR | Status: DC | PRN
  Administered 2022-03-26: 40 mg via INTRAVENOUS

## 2022-03-26 MED ORDER — INSULIN ASPART 100 UNIT/ML IJ SOLN
0.0000 [IU] | Freq: Three times a day (TID) | INTRAMUSCULAR | Status: DC
  Administered 2022-03-26: 5 [IU] via SUBCUTANEOUS

## 2022-03-26 MED ORDER — PROPOFOL 500 MG/50ML IV EMUL
INTRAVENOUS | Status: DC | PRN
  Administered 2022-03-26: 100 ug/kg/min via INTRAVENOUS

## 2022-03-26 SURGICAL SUPPLY — 15 items

## 2022-03-26 NOTE — Hospital Course (Signed)
Mr. Fitzhenry is a 76 yo male with PMH CAD, DMII, HTN, HLD, OSA, obesity, transverse myelitis, NMO on rituxan who presented with an ongoing leaking G-tube site.  He was referred to the hospital after IR attempted to cauterize the tract with silver nitrate. He underwent EGD after admission which showed a patent gastrostomy tract.  2 gastric polyps were removed.  There was no gastric outlet obstruction.  General surgery was then consulted for further evaluation.  He is being considered for possible surgical repair.  Other options would be to replace the G-tube or continue watchful monitoring. He will continue following with general surgery after discharge for further discussion.

## 2022-03-26 NOTE — Transfer of Care (Signed)
Immediate Anesthesia Transfer of Care Note  Patient: Brent [name].  Procedure(s) Performed: ESOPHAGOGASTRODUODENOSCOPY (EGD) WITH PROPOFOL BIOPSY  Patient Location: PACU  Anesthesia Type:MAC  Level of Consciousness: drowsy and patient cooperative  Airway & Oxygen Therapy: Patient Spontanous Breathing and Patient connected to nasal cannula oxygen  Post-op Assessment: Report given to RN and Post -op Vital signs reviewed and stable  Post vital signs: Reviewed and stable  Last Vitals:  Vitals Value Taken Time  BP 113/51 03/26/22 1026  Temp 36.8 C 03/26/22 1026  Pulse 60 03/26/22 1028  Resp 0 03/26/22 1027  SpO2 98 % 03/26/22 1028  Vitals shown include unvalidated device data.  Last Pain:  Vitals:   03/26/22 1026  TempSrc: Temporal  PainSc: 0-No pain         Complications: No notable events documented.

## 2022-03-26 NOTE — NC FL2 (Signed)
[place] LEVEL OF CARE SCREENING TOOL     IDENTIFICATION  Patient Name: Brent [name]. Birthdate: May 26, 1946 Sex: male Admission Date (Current Location): 03/25/2022  [facility] and [place] Number:  Herbalist and Address:  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 21/30/8657   Complicated UTI (urinary tract infection)    Catheter-associated urinary tract infection ([place]) 08/06/2021   Osteomyelitis of pelvic region, acute, right ([place]) 03/24/2021   Right ischial pressure sore 02/10/2021   Hypokalemia    Encounter for monitoring immunomodulating therapy 08/20/2020   High risk medication use 08/20/2020   Malnutrition of moderate degree 06/04/2020   Palliative care by specialist    Goals of care, counseling/discussion    FTT (failure to thrive) in adult 06/01/2020   Abdominal pain    Sacral ulcer, with necrosis of muscle ([place]) 04/09/2020   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[place])    Transverse myelitis ([place])    Labile blood pressure    Labile blood glucose    Neurogenic bladder    Neurogenic bowel    Transaminitis    Controlled type 2 diabetes mellitus with hyperglycemia, with long-term current use of insulin ([place])    Quadriplegia ([place])    Dyslipidemia    Paraplegia ([place])    Coronary artery disease involving native coronary artery of native heart  without angina pectoris    Steroid-induced hyperglycemia    Diabetic peripheral neuropathy ([place])    Weakness 03/09/2020   Chest pain 03/08/2020   Right leg numbness    Malignant melanoma of left side of neck ([place]) 10/25/2018   Facial neuritis 10/25/2018   Myofascial pain syndrome 10/25/2018   Occipital neuralgia of left side 10/25/2018   Male hypogonadism 06/20/2016   Renal lesion 06/20/2016   Insomnia 08/07/2015   Enlarged prostate with lower urinary tract symptoms (LUTS) 11/07/2014   Balanitis 07/30/2012   Bladder neck obstruction 07/30/2012   Prostate nodule 06/18/2012   Recurrent nephrolithiasis 06/18/2012   Benign neoplasm of colon 08/29/2011   DEPRESSION, SITUATIONAL, ACUTE 01/23/2010   Essential hypertension 05/30/2009   Coronary atherosclerosis 05/30/2009   Obstructive sleep apnea 11/28/2008   OBESITY 09/23/2008   ERECTILE DYSFUNCTION 11/26/2007   Well controlled type 2 diabetes mellitus with peripheral circulatory disorder ([employer]) 05/09/2007   Hyperlipidemia 05/09/2007   MYOCARDIAL INFARCTION, HX OF 05/09/2007   DIVERTICULOSIS, COLON 05/09/2007    Orientation RESPIRATION BLADDER Height & Weight     Self, Time, Situation, Place  Normal Incontinent Weight: 190 lb 0.6 oz (86.2 kg) Height:  6' (182.9 cm)  BEHAVIORAL SYMPTOMS/MOOD NEUROLOGICAL BOWEL NUTRITION STATUS      Colostomy Feeding tube  AMBULATORY STATUS COMMUNICATION OF NEEDS Skin   Extensive Assist Verbally                         Personal  Care Assistance Level of Assistance  Bathing, Feeding, Dressing Bathing Assistance: Maximum assistance Feeding assistance: Limited assistance (HX Peg tube removed) Dressing Assistance: Maximum assistance     Functional Limitations Info  Sight, Hearing, Speech Sight Info: Adequate Hearing Info: Adequate Speech Info: Adequate    SPECIAL CARE FACTORS FREQUENCY                       Contractures Contractures Info: Not present    Additional Factors  Info  Code Status, Allergies Code Status Info: full code Allergies Info: Allergies Info: Levofloxacin   Penicillins           Current Medications (03/26/2022):  This is the current hospital active medication list Current Facility-Administered Medications  Medication Dose Route Frequency Provider Last Rate Last Admin   acetaminophen (TYLENOL) tablet 650 mg  650 mg Oral Q6H PRN Gatha Mayer, MD   650 mg at 03/25/22 2258   Or   acetaminophen (TYLENOL) suppository 650 mg  650 mg Rectal Q6H PRN Gatha Mayer, MD       atenolol (TENORMIN) tablet 25 mg  25 mg Oral QHS Gatha Mayer, MD   25 mg at 03/25/22 2257   atorvastatin (LIPITOR) tablet 20 mg  20 mg Oral QHS Gatha Mayer, MD   20 mg at 03/25/22 2256   baclofen (LIORESAL) tablet 10 mg  10 mg Oral TID Gatha Mayer, MD   10 mg at 03/26/22 1700   ceFAZolin (ANCEF) IVPB 1 g/50 mL premix  1 g Intravenous Q12H Gatha Mayer, MD       cholecalciferol (VITAMIN D3) tablet 3,000 Units  3,000 Units Oral Daily Gatha Mayer, MD       enoxaparin (LOVENOX) injection 40 mg  40 mg Subcutaneous Q24H Gatha Mayer, MD   40 mg at 03/25/22 2259   famotidine (PEPCID) tablet 20 mg  20 mg Oral BID Gatha Mayer, MD   20 mg at 03/25/22 2258   fluticasone (FLONASE) 50 MCG/ACT nasal spray 2 spray  2 spray Each Nare BID Gatha Mayer, MD       insulin aspart (novoLOG) injection 0-9 Units  0-9 Units Subcutaneous TID WC Dwyane Dee, MD       insulin glargine-yfgn [facility]) injection 20 Units  20 Units Subcutaneous QHS Gatha Mayer, MD   20 Units at 03/26/22 0100   ketotifen (ZADITOR) 0.025 % ophthalmic solution 1 drop  1 drop Both Eyes BID Gatha Mayer, MD       lactated ringers infusion   Intravenous Continuous Gatha Mayer, MD   Paused at 03/26/22 0930   losartan (COZAAR) tablet 50 mg  50 mg Oral Daily Gatha Mayer, MD       ondansetron [facility]) tablet 4 mg  4 mg Oral Q6H PRN Gatha Mayer, MD       Or   ondansetron [facility])  injection 4 mg  4 mg Intravenous Q6H PRN Gatha Mayer, MD       polyvinyl alcohol (LIQUIFILM TEARS) 1.4 % ophthalmic solution 1 drop  1 drop Both Eyes QID Gatha Mayer, MD       sertraline (ZOLOFT) tablet 25 mg  25 mg Oral Daily Gatha Mayer, MD       traZODone (DESYREL) tablet 75 mg  75 mg Oral QHS Gatha Mayer, MD   75 mg at 03/25/22 2255     Discharge Medications: Please see discharge  summary for a list of discharge medications.  Relevant Imaging Results:  Relevant Lab Results:   Additional Information SS# [id number]  Elliot Gurney Adelino, [place]

## 2022-03-26 NOTE — TOC Transition Note (Signed)
Transition of Care [facility]) - CM/SW Discharge Note   Patient Details  Name: Brent [name]. MRN: [id number] Date of Birth: 1946-04-18  Transition of Care [facility]) CM/SW Contact:  Elliot Gurney [place], [place] Phone Number: 03/26/2022, 5:45 PM   Clinical Narrative:     Patient to discharge back to [facility]. Discharge summary and Fl2 faxed to [phone]. Please call report to Festus Holts at [phone]. Patient to be transported by [facility].   Fronia Depass, LCSW Transition of Care          Patient Goals and CMS Choice        Discharge Placement                       Discharge Plan and Services                                     Social Determinants of Health (SDOH) Interventions     Readmission Risk Interventions    06/08/2020   11:45 AM  Readmission Risk Prevention Plan  Transportation Screening Complete  Medication Review (RN Care Manager) Referral to Pharmacy  PCP or Specialist appointment within 3-5 days of discharge Not Complete  PCP/Specialist Appt Not Complete comments SNF resident  [facility] or [place] Complete  SW Recovery Care/Counseling Consult Complete  Palliative Care Screening Complete  Skilled Nursing Facility Complete

## 2022-03-26 NOTE — Consult Note (Addendum)
Consultation  Referring Provider: Dr. Sabino Gasser   Primary Care Physician:  Raymondo Band, MD Primary Gastroenterologist: Dr. Simona Huh        Reason for Consultation:   "Leaking G tube"         HPI:   Brent [name]. is a 76 y.o. male with past medical history as listed below including paraplegia due to transverse myelitis, hypertension, diabetes and chronic osteomyelitis of the ischium from decubitus ulcer as well as previous colostomy, who presented to the ED with a "nonhealing G tube site".    At admission noted that G tube had been placed in 2021 and removed in May 2023 but unfortunately the G tube site had failed to heal, significant leakage of anything he ate from the G tube site.  Apparently saw IR 03/24/2022 who attempted to cauterize the tract with silver nitrate and keep patient n.p.o. to see if it would heal without did not work.    Also notes that admission discussed that it would not be a good idea to perform a fluoroscopy studies of the G-tube tract and as it would require catheterization of the track possibly causing it to increase in size.    Today, patient explains while on May 25 he had his G-tube taken out and ever since then anything that he eats about 30 minutes later seems to come out into the bag connected to the ostomy site.  Tells me in fact that he had eaten vanilla ice cream yesterday evening and there are still bits of it in there.  He has continued to produce stool, soft, out of his colostomy but tells me he really does not pay attention to this so he is not sure if the output has changed at all.  Anything he eats comes out of the stoma site about 1/2-hour after eating.  Denies any abdominal pain, though he has not much feeling in his abdomen (due to transverse myelitis), he can feel pressure with palpation.  Associated symptoms include a cough which is developed over the past 3 days, he tells me that if he coughs a lot eventually he will produce some "white bubbly  stuff", no other upper respiratory symptoms.    Denies fever, chills, weight loss or blood in his stool.  GI history: 03/25/2022 phone note from Dr. Loletha Carrow: Discussed there was some question about gastric outlet obstruction versus gastroparesis as a reason for "nonhealing G-tube tract", biopsy at that time will not the best way to answer the question of gastric outlet may be for IR to perform a Gastrografin study through the ostomy site, it is recommended that he was admitted to the hospital as he had been left n.p.o. and was at risk for volume depletion and electrolyte derangement and further malnutrition  Past Medical History:  Diagnosis Date   Allergy    CAD (coronary artery disease)    a. angioplasty of his RCA in 1990. b. bare metal stent placed in the RCA in 2000 followed by rotational atherectomy shortly after for stent restenosis. c. last cath was in 2012 showing stable moderate diffuse CAD. (70% mid LAD, 80% diagonal, 70% Ramus, 40% mid to distal RCA stent restenosis). d. Low risk nuc in 2015.   Diabetes mellitus    Diverticulosis    Elevated CK    Erectile dysfunction    Hemorrhoids    HTN (hypertension)    Hyperlipidemia    Hypertriglyceridemia    Malignant melanoma of left side of neck (  [place]) 10/25/2018   Myocardial infarction ([place])    Obesity    OSA (obstructive sleep apnea)    Persistent disorder of initiating or maintaining sleep    Personal history of colonic polyps 02/05/2003    Past Surgical History:  Procedure Laterality Date   CHOLECYSTECTOMY     CORONARY ANGIOPLASTY     CORONARY STENT PLACEMENT     stenting of the right coronary artery with followup rotational  atherectomy. (3 stents placed)   FINGER SURGERY     right   FOOT SURGERY     right   INGUINAL HERNIA REPAIR     right   IR GASTROSTOMY TUBE REMOVAL  02/10/2022   IRRIGATION AND DEBRIDEMENT ABSCESS N/A 06/04/2020   Procedure: IRRIGATION AND DEBRIDEMENT SACRAL WOUND;  Surgeon: Jesusita Oka, MD;   Location: [place];  Service: General;  Laterality: N/A;   LAPAROSCOPY N/A 06/04/2020   Procedure: LAPAROSCOPY ASSISTED COLOSTOMY, LAPAROSCOPY GASTROSTOMY;  Surgeon: Jesusita Oka, MD;  Location: [place];  Service: General;  Laterality: N/A;   LYSIS OF ADHESION N/A 06/04/2020   Procedure: LYSIS OF ADHESION;  Surgeon: Jesusita Oka, MD;  Location: [place];  Service: General;  Laterality: N/A;   melanoma removal     neck   ORTHOPEDIC SURGERY     foot right   rotator cuff surg     Bil    Family History  Problem Relation Age of Onset   Liver cancer Father    Lung cancer Father    Heart disease Father    Heart disease Sister    Lung cancer Mother    COPD Mother    Obesity Mother    Colon cancer Neg Hx    Stomach cancer Neg Hx    Pancreatic cancer Neg Hx      Social History   Tobacco Use   Smoking status: Former    Packs/day: 1.50    Years: 30.00    Total pack years: 45.00    Types: Cigarettes    Quit date: 09/19/1980    Years since quitting: 41.5   Smokeless tobacco: Never  Vaping Use   Vaping Use: Never used  Substance Use Topics   Alcohol use: Not Currently    Alcohol/week: 7.0 - 14.0 standard drinks of alcohol    Types: 7 - 14 Shots of liquor per week   Drug use: No    Prior to Admission medications   Medication Sig Start Date End Date Taking? Authorizing Provider  acetaminophen (TYLENOL) 500 MG tablet Take 500 mg by mouth every 6 (six) hours as needed for moderate pain.   Yes [provider]  albuterol (PROVENTIL) (2.5 MG/3ML) 0.083% nebulizer solution Take 3 mLs (2.5 mg total) by nebulization every 6 (six) hours as needed for wheezing or shortness of breath (or anaphylaxis due to Rituxan infusion). 08/10/21  Yes Pokhrel, Laxman, MD  Alogliptin Benzoate 25 MG TABS Take 25 mg by mouth daily.   Yes [provider]  aspirin EC 81 MG tablet Take 81 mg by mouth daily. Swallow whole.   Yes [provider]  atenolol (TENORMIN) 25 MG tablet Take by  mouth at bedtime.   Yes [provider]  atorvastatin (LIPITOR) 20 MG tablet Take 1 tablet (20 mg total) by mouth daily. Patient taking differently: Take 20 mg by mouth at bedtime. 03/26/20  Yes Shelly Coss, MD  azelastine (OPTIVAR) 0.05 % ophthalmic solution Place 1 drop into both eyes 2 (two) times daily.  Yes [provider]  baclofen (LIORESAL) 10 MG tablet Take 10 mg by mouth 3 (three) times daily.   Yes [provider]  cephALEXin (KEFLEX) 250 MG capsule Take 250 mg by mouth daily. contineous 10/02/21  Yes [provider]  cholecalciferol (VITAMIN D3) 25 MCG (1000 UNIT) tablet Take 3,000 Units by mouth daily.   Yes [provider]  famotidine (PEPCID) 20 MG tablet Take 20 mg by mouth 2 (two) times daily.   Yes [provider]  ferrous gluconate (FERGON) 324 MG tablet Take 324 mg by mouth every other day.   Yes [provider]  fluticasone (FLONASE) 50 MCG/ACT nasal spray Place 2 sprays into both nostrils 2 (two) times daily. 04/24/20  Yes Love, Ivan Anchors, PA-C  insulin aspart (NOVOLOG) 100 UNIT/ML injection Inject 0-15 Units into the skin every 4 (four) hours. Sliding scale  CBG 70 - 120: 0 units: CBG 121 - 150: 2 units; CBG 151 - 200: 3 units; CBG 201 - 250: 5 units; CBG 251 - 300: 8 units;CBG 301 - 350: 11 units; CBG 351 - 400: 15 units; CBG > 400 : 15 units and notify MD Patient taking differently: Inject 0-15 Units into the skin in the morning and at bedtime. Sliding scale  If CBG>70 Call NP/PA, CBG 70 - 120: 0 units: CBG 121 - 150: 2 units; CBG 151 - 200: 3 units; CBG 201 - 250: 5 units; CBG 251 - 300: 8 units; CBG 301 - 350: 11 units; CBG 351 - 400: 15units and  CBG >400: 15 Units, CBG>400 Call MD 06/10/20  Yes Rai, Ripudeep K, MD  insulin glargine (LANTUS) 100 unit/mL SOPN Inject 15 Units into the skin at bedtime. Patient taking differently: Inject 38 Units into the skin at bedtime. 06/10/20  Yes Rai, Ripudeep K, MD  losartan  (COZAAR) 50 MG tablet Take 50 mg by mouth daily.   Yes [provider]  Magnesium Oxide, Elemental, 400 MG TABS Take 400 mg by mouth 3 (three) times daily.   Yes [provider]  Menthol, Topical Analgesic, (BIOFREEZE) 4 % GEL Apply 1 application  topically 2 (two) times daily. To back and shoulders   Yes [provider]  metFORMIN (GLUCOPHAGE-XR) 500 MG 24 hr tablet Take 1,000 mg by mouth in the morning and at bedtime.   Yes [provider]  nystatin powder Apply 1 Application topically daily.   Yes [provider]  OxyCODONE HCl, Abuse Deter, (OXAYDO) 5 MG TABA Take 5 mg by mouth every 8 (eight) hours as needed (pain).   Yes [provider]  phenol (CHLORASEPTIC) 1.4 % LIQD Use as directed 2 sprays in the mouth or throat every 4 (four) hours as needed for throat irritation / pain.   Yes [provider]  Polyethyl Glycol-Propyl Glycol (GENTEAL TEARS SEVERE DAY/NIGHT) 0.4-0.3 % GEL ophthalmic gel Place 1 Application into both eyes in the morning, at noon, in the evening, and at bedtime. 2 drops qid   Yes [provider]  Pseudoeph-Bromphen-DM (ROBITUSSIN ALLERGY/COUGH PO) Take 10 mLs by mouth every 6 (six) hours as needed (cough).   Yes [provider]  PSYLLIUM PO Take 0.8 g by mouth in the morning and at bedtime.   Yes [provider]  riTUXimab (RITUXAN) 100 MG/10ML injection Inject 10 mg into the vein See admin instructions. Once between 12th -13th of June, December   Yes [provider]  senna-docusate (SENOKOT-S) 8.6-50 MG tablet Take 2 tablets by  mouth daily.   Yes [provider]  sertraline (ZOLOFT) 25 MG tablet Take 25 mg by mouth daily.   Yes [provider]  traZODone (DESYREL) 50 MG tablet Take 75 mg by mouth at bedtime.   Yes [provider]  vitamin B-12 (CYANOCOBALAMIN) 1000 MCG tablet Take 2,000 mcg by mouth daily.   Yes [provider]  vitamin C  (ASCORBIC ACID) 500 MG tablet Take 500 mg by mouth daily.   Yes [provider]    Current Facility-Administered Medications  Medication Dose Route Frequency Provider Last Rate Last Admin   acetaminophen (TYLENOL) tablet 650 mg  650 mg Oral Q6H PRN Etta Quill, DO   650 mg at 03/25/22 2258   Or   acetaminophen (TYLENOL) suppository 650 mg  650 mg Rectal Q6H PRN Etta Quill, DO       atenolol (TENORMIN) tablet 25 mg  25 mg Oral QHS Jennette Kettle M, DO   25 mg at 03/25/22 2257   atorvastatin (LIPITOR) tablet 20 mg  20 mg Oral QHS Jennette Kettle M, DO   20 mg at 03/25/22 2256   baclofen (LIORESAL) tablet 10 mg  10 mg Oral TID Etta Quill, DO   10 mg at 03/25/22 2257   ceFAZolin (ANCEF) IVPB 1 g/50 mL premix  1 g Intravenous Q12H Bertis Ruddy, RPH       cholecalciferol (VITAMIN D3) tablet 3,000 Units  3,000 Units Oral Daily Alcario Drought, Jared M, DO       enoxaparin (LOVENOX) injection 40 mg  40 mg Subcutaneous Q24H Alcario Drought, Jared M, DO   40 mg at 03/25/22 2259   famotidine (PEPCID) tablet 20 mg  20 mg Oral BID Etta Quill, DO   20 mg at 03/25/22 2258   fluticasone (FLONASE) 50 MCG/ACT nasal spray 2 spray  2 spray Each Nare BID Etta Quill, DO       insulin aspart (novoLOG) injection 0-9 Units  0-9 Units Subcutaneous Q4H Etta Quill, DO   3 Units at 03/26/22 6378   insulin glargine-yfgn (SEMGLEE) injection 20 Units  20 Units Subcutaneous QHS Etta Quill, DO   20 Units at 03/26/22 0100   ketotifen (ZADITOR) 0.025 % ophthalmic solution 1 drop  1 drop Both Eyes BID Jennette Kettle M, DO       lactated ringers infusion   Intravenous Continuous Etta Quill, DO 100 mL/hr at 03/26/22 0824 New Bag at 03/26/22 0824   losartan (COZAAR) tablet 50 mg  50 mg Oral Daily Jennette Kettle M, DO       ondansetron [facility]) tablet 4 mg  4 mg Oral Q6H PRN Etta Quill, DO       Or   ondansetron [facility]) injection 4 mg  4 mg Intravenous Q6H PRN Etta Quill, DO        polyvinyl alcohol (LIQUIFILM TEARS) 1.4 % ophthalmic solution 1 drop  1 drop Both Eyes QID Alcario Drought, Jared M, DO       sertraline (ZOLOFT) tablet 25 mg  25 mg Oral Daily Alcario Drought, Jared M, DO       traZODone (DESYREL) tablet 75 mg  75 mg Oral QHS Jennette Kettle M, DO   75 mg at 03/25/22 2255   Current Outpatient Medications  Medication Sig Dispense Refill   acetaminophen (TYLENOL) 500 MG tablet Take 500 mg by mouth every 6 (six) hours as needed for moderate pain.     albuterol (PROVENTIL) (2.5 MG/3ML) 0.083%  nebulizer solution Take 3 mLs (2.5 mg total) by nebulization every 6 (six) hours as needed for wheezing or shortness of breath (or anaphylaxis due to Rituxan infusion).     Alogliptin Benzoate 25 MG TABS Take 25 mg by mouth daily.     aspirin EC 81 MG tablet Take 81 mg by mouth daily. Swallow whole.     atenolol (TENORMIN) 25 MG tablet Take by mouth at bedtime.     atorvastatin (LIPITOR) 20 MG tablet Take 1 tablet (20 mg total) by mouth daily. (Patient taking differently: Take 20 mg by mouth at bedtime.)     azelastine (OPTIVAR) 0.05 % ophthalmic solution Place 1 drop into both eyes 2 (two) times daily.     baclofen (LIORESAL) 10 MG tablet Take 10 mg by mouth 3 (three) times daily.     cephALEXin (KEFLEX) 250 MG capsule Take 250 mg by mouth daily. contineous     cholecalciferol (VITAMIN D3) 25 MCG (1000 UNIT) tablet Take 3,000 Units by mouth daily.     famotidine (PEPCID) 20 MG tablet Take 20 mg by mouth 2 (two) times daily.     ferrous gluconate (FERGON) 324 MG tablet Take 324 mg by mouth every other day.     fluticasone (FLONASE) 50 MCG/ACT nasal spray Place 2 sprays into both nostrils 2 (two) times daily.  2   insulin aspart (NOVOLOG) 100 UNIT/ML injection Inject 0-15 Units into the skin every 4 (four) hours. Sliding scale  CBG 70 - 120: 0 units: CBG 121 - 150: 2 units; CBG 151 - 200: 3 units; CBG 201 - 250: 5 units; CBG 251 - 300: 8 units;CBG 301 - 350: 11 units; CBG 351 - 400: 15 units;  CBG > 400 : 15 units and notify MD (Patient taking differently: Inject 0-15 Units into the skin in the morning and at bedtime. Sliding scale  If CBG>70 Call NP/PA, CBG 70 - 120: 0 units: CBG 121 - 150: 2 units; CBG 151 - 200: 3 units; CBG 201 - 250: 5 units; CBG 251 - 300: 8 units; CBG 301 - 350: 11 units; CBG 351 - 400: 15units and  CBG >400: 15 Units, CBG>400 Call MD) 10 mL 11   insulin glargine (LANTUS) 100 unit/mL SOPN Inject 15 Units into the skin at bedtime. (Patient taking differently: Inject 38 Units into the skin at bedtime.) 15 mL 11   losartan (COZAAR) 50 MG tablet Take 50 mg by mouth daily.     Magnesium Oxide, Elemental, 400 MG TABS Take 400 mg by mouth 3 (three) times daily.     Menthol, Topical Analgesic, (BIOFREEZE) 4 % GEL Apply 1 application  topically 2 (two) times daily. To back and shoulders     metFORMIN (GLUCOPHAGE-XR) 500 MG 24 hr tablet Take 1,000 mg by mouth in the morning and at bedtime.     nystatin powder Apply 1 Application topically daily.     OxyCODONE HCl, Abuse Deter, (OXAYDO) 5 MG TABA Take 5 mg by mouth every 8 (eight) hours as needed (pain).     phenol (CHLORASEPTIC) 1.4 % LIQD Use as directed 2 sprays in the mouth or throat every 4 (four) hours as needed for throat irritation / pain.     Polyethyl Glycol-Propyl Glycol (GENTEAL TEARS SEVERE DAY/NIGHT) 0.4-0.3 % GEL ophthalmic gel Place 1 Application into both eyes in the morning, at noon, in the evening, and at bedtime. 2 drops qid     Pseudoeph-Bromphen-DM (ROBITUSSIN ALLERGY/COUGH PO) Take 10 mLs by  mouth every 6 (six) hours as needed (cough).     PSYLLIUM PO Take 0.8 g by mouth in the morning and at bedtime.     riTUXimab (RITUXAN) 100 MG/10ML injection Inject 10 mg into the vein See admin instructions. Once between 12th -13th of June, December     senna-docusate (SENOKOT-S) 8.6-50 MG tablet Take 2 tablets by mouth daily.     sertraline (ZOLOFT) 25 MG tablet Take 25 mg by mouth daily.     traZODone (DESYREL) 50  MG tablet Take 75 mg by mouth at bedtime.     vitamin B-12 (CYANOCOBALAMIN) 1000 MCG tablet Take 2,000 mcg by mouth daily.     vitamin C (ASCORBIC ACID) 500 MG tablet Take 500 mg by mouth daily.      Allergies as of 03/25/2022 - Review Complete 03/25/2022  Allergen Reaction Noted   Gabapentin Other (See Comments) 03/25/2022   Levofloxacin Rash 03/22/2020   Penicillins Hives and Rash 05/30/2005     Review of Systems:    Constitutional: No weight loss, fever or chills HEENT:+cough Skin: No rash  Cardiovascular: No chest pain  Respiratory: No SOB Gastrointestinal: See HPI and otherwise negative Genitourinary: No dysuria  Neurological: No headache, dizziness or syncope Musculoskeletal: No new muscle or joint pain Hematologic: No bleeding  Psychiatric: No history of depression or anxiety    Physical Exam:  Vital signs in last 24 hours: Temp:  [97.9 F (36.6 C)-98.3 F (36.8 C)] 97.9 F (36.6 C) (07/08 0106) Pulse Rate:  [59-72] 59 (07/08 0631) Resp:  [16-18] 16 (07/08 0631) BP: (124-157)/(48-71) 124/48 (07/08 0631) SpO2:  [96 %-100 %] 98 % (07/08 0631)   General:   Pleasant Caucasian male appears to be in NAD, Well developed, Well nourished, alert and cooperative Head:  Normocephalic and atraumatic. Eyes:   PEERL, EOMI. No icterus. Conjunctiva pink. Ears:  Normal auditory acuity. Neck:  Supple Throat: Oral cavity and pharynx without inflammation, swelling or lesion. Teeth in good condition. Lungs: Respirations even and unlabored. Lungs clear to auscultation bilaterally.   No wheezes, crackles, or rhonchi.  Heart: Normal S1, S2. No MRG. Regular rate and rhythm. No peripheral edema, cyanosis or pallor.  Abdomen:  Soft, nondistended, nontender. No rebound or guarding. Normal bowel sounds. No appreciable masses or hepatomegaly.+ g tube with granular secretion +colostomy with soft brown stool Rectal:  Not performed.  Msk:  Symmetrical without gross deformities. Peripheral pulses  intact.  Extremities:  Without edema, no deformity or joint abnormality. Normal ROM, normal sensation. Neurologic:  Alert and  oriented x4; +paraplegia Skin:   Dry and intact without significant lesions or rashes. Psychiatric: Demonstrates good judgement and reason without abnormal affect or behaviors.   LAB RESULTS: Recent Labs    03/25/22 1840 03/26/22 0351  WBC 10.0 7.9  HGB 12.3* 10.5*  HCT 38.7* 32.8*  PLT 298 268   BMET Recent Labs    03/25/22 1840 03/26/22 0351  NA 135 137  K 4.1 3.7  CL 98 104  CO2 24 25  GLUCOSE 349* 202*  BUN 12 9  CREATININE 0.57* 0.54*  CALCIUM 9.4 8.9   LFT Recent Labs    03/25/22 1840  PROT 6.5  ALBUMIN 3.0*  AST 16  ALT 14  ALKPHOS 119  BILITOT 0.4   STUDIES: DG Chest 1 View  Result Date: 03/25/2022 CLINICAL DATA:  Difficulty breathing EXAM: CHEST  1 VIEW COMPARISON:  11/01/2021 FINDINGS: Cardiac shadow is enlarged but stable. Lungs are clear. No bony abnormality is  noted. IMPRESSION: Stable cardiomegaly.  No acute abnormality noted. Electronically Signed   By: Inez Catalina M.D.   On: 03/25/2022 23:52   CT Abdomen Pelvis W Contrast  Result Date: 03/25/2022 CLINICAL DATA:  Bowel obstruction suspected. Sent from facility physician for G-tube insertion site sutured. EXAM: CT ABDOMEN AND PELVIS WITH CONTRAST TECHNIQUE: Multidetector CT imaging of the abdomen and pelvis was performed using the standard protocol following bolus administration of intravenous contrast. RADIATION DOSE REDUCTION: This exam was performed according to the departmental dose-optimization program which includes automated exposure control, adjustment of the mA and/or kV according to patient size and/or use of iterative reconstruction technique. CONTRAST:  134m OMNIPAQUE IOHEXOL 300 MG/ML  SOLN COMPARISON:  08/06/2021. FINDINGS: Lower chest: The heart is normal in size and there is a trace pericardial effusion. Multi-vessel coronary artery calcifications are noted. There  are trace bilateral pleural effusions with atelectasis at the lung bases. Hepatobiliary: No focal liver abnormality is seen. Status post cholecystectomy. No biliary dilatation. Pancreas: Unremarkable. No pancreatic ductal dilatation or surrounding inflammatory changes. Spleen: Normal in size without focal abnormality. Adrenals/Urinary Tract: No adrenal nodule. There is redemonstration of a cyst in the upper pole of the left kidney with layering hyperdense material. An additional cyst is noted in the left kidney. Subcentimeter hypodensity is noted in the right kidney which is too small to further characterize. No definite renal calculus. Vascular calcifications are present at the renal hila bilaterally. No hydronephrosis. A suprapubic catheter is noted. Stomach/Bowel: Percutaneous gastrostomy site without evidence of PEG tube. However, a tubular air-filled structure is noted in the gastric body. No bowel obstruction, free air, or pneumatosis. Scattered diverticula are present along the colon without evidence of diverticulitis. There is a left lower quadrant colostomy. The appendix is within normal limits. No bowel wall thickening or surrounding inflammatory changes. Vascular/Lymphatic: Aortic atherosclerosis. No enlarged abdominal or pelvic lymph nodes. Reproductive: Prostate is unremarkable. Other: Fat containing inguinal hernias are present bilaterally. No abdominopelvic ascites. Musculoskeletal: Gynecomastia is noted. There is a decubitus ulcer over the ischium on the right with bony erosions, similar in appearance to the prior exam. Degenerative changes are present in the thoracolumbar spine. No acute osseous abnormality. IMPRESSION: 1. No evidence of bowel obstruction or free air. 2. Percutaneous gastrostomy site without evidence of PEG tube. An air-fluid tubular structure is noted in the gastric body. Correlate clinically to exclude residual foreign body. 3. Diverticulosis without diverticulitis. 4. Decubitus  ulcer over the ischium on the right with erosive bony changes, not significantly changed from the prior exam, possible chronic osteomyelitis. 5. Stable renal cysts. 6. Aortic atherosclerosis and coronary artery calcifications. 7. Remaining incidental findings as described above. Critical findings were reported to Dr. JIsla Penceat 8:23 p.m. Electronically Signed   By: LBrett FairyM.D.   On: 03/25/2022 20:25     Impression / Plan:   Impression: 1.  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  Chronic osteomyelitis of the pelvic region 3.  Paraplegia: Due to history of transverse myelitis 4. Colostomy  Plan: 1.  Plan for EGD today with Dr. GCarlean Purl  Did discuss risks, benefits, limitations and alternatives and patient agrees to proceed. 2.  Patient remain n.p.o. for now until after time of procedure 3.  Await further recommendations  after time of procedure  Thank you for your kind consultation, we will continue to follow.  Lavone Nian [facility]  03/26/2022, 8:26 AM     [facility] Attending   I have taken an interval history, reviewed the chart and examined the patient. I agree with the Advanced Practitioner's note, impression and recommendations.  Evaluate with EGD. The risks and benefits as well as alternatives of endoscopic procedure(s) have been discussed and reviewed. All questions answered. The patient agrees to proceed.  Gatha Mayer, MD, [facility] See Shea [name] on call - gastroenterology for best contact person 03/26/2022 10:09 AM

## 2022-03-26 NOTE — Op Note (Addendum)
[facility] Patient Name: Brent [name] Procedure Date : 03/26/2022 MRN: [id number] Attending MD: Gatha Mayer , MD Date of Birth: Feb 22, 1946 CSN: [id number] Age: 76 Admit Type: Inpatient Procedure:                Upper GI endoscopy Indications:              Abnormal CT of the GI tract Providers:                Gatha Mayer, MD, Gabriel Earing, RN,                            Benetta Spar, Technician Referring MD:              Medicines:                Monitored Anesthesia Care Complications:            No immediate complications. Estimated Blood Loss:     Estimated blood loss was minimal. Procedure:                Pre-Anesthesia Assessment:                           - Prior to the procedure, a History and Physical                            was performed, and patient medications and                            allergies were reviewed. The patient's tolerance of                            previous anesthesia was also reviewed. The risks                            and benefits of the procedure and the sedation                            options and risks were discussed with the patient.                            All questions were answered, and informed consent                            was obtained. Prior Anticoagulants: The patient                            last took Lovenox (enoxaparin) 1 day prior to the                            procedure. ASA Grade Assessment: IV - A patient                            with severe systemic disease that is a constant  threat to life. After reviewing the risks and                            benefits, the patient was deemed in satisfactory                            condition to undergo the procedure.                           After obtaining informed consent, the endoscope was                            passed under direct vision. Throughout the                            procedure, the  patient's blood pressure, pulse, and                            oxygen saturations were monitored continuously. The                            GIF-H190 ([id number]) Olympus endoscope was introduced                            through the mouth, and advanced to the second part                            of duodenum. The upper GI endoscopy was                            accomplished without difficulty. The patient                            tolerated the procedure well. Scope In: Scope Out: Findings:      There was evidence of a gastrostomy tract present on the anterior wall       of the gastric body. It looks healthy and patent.      A single 8 to 10 mm sessile polyp with no bleeding and no stigmata of       recent bleeding was found on the greater curvature of the gastric body.       Biopsies were taken with a cold forceps for histology. Verification of       patient identification for the specimen was done. Estimated blood loss       was minimal.      A single 4 mm sessile polyp with no stigmata of recent bleeding was       found in the prepyloric region of the stomach. Biopsies were taken with       a cold forceps for histology. Verification of patient identification for       the specimen was done. Estimated blood loss was minimal.      Patchy mildly erythematous mucosa was found in the entire examined       stomach.      A small amount of food (residue) was found in the gastric fundus and in  the gastric body. Suctioned      The examined duodenum was normal.      The cardia and gastric fundus were otherwise normal on retroflexion.      The exam was otherwise without abnormality. Impression:               - Gastrostomy tract present and patent                           - A single gastric polyp. Biopsied.                           - A single gastric polyp. Biopsied.                           - Erythematous mucosa in the stomach.                           - A small amount of food  (residue) in the stomach.                           - Normal examined duodenum.                           - The examination was otherwise normal. NO GASTRIC                            OUTLET OBSTRUCTION - I THINK THE PROBLEM IS THAT                            THE GASTROSTOMY TRACT WILL NOT HEAL. NO TUBULAR                            STRUCTURE AS SUSPECTED ON CT Recommendation:           - Return patient to hospital ward for ongoing care.                           - CHO-modified diet                           Await pathology                           Not sure what else might be done to close the                            gastro-cutaneous fistula. This does not look                            amenable to standard clipping, might be options                            from advanced interventional endoscopist but  unlikley to be p[ossible during this admission.                            Maybe general surgery has some ideas? would consult                            them                           Leaving a gastrostomy tube in place is not an                            option I suppose as there was leakage around the                            tube when in? Procedure Code(s):        --- Professional ---                           [phone], Esophagogastroduodenoscopy, flexible,                            transoral; with biopsy, single or multiple Diagnosis Code(s):        --- Professional ---                           Z93.1, Gastrostomy status                           K31.7, Polyp of stomach and duodenum                           K31.89, Other diseases of stomach and duodenum                           R93.3, Abnormal findings on diagnostic imaging of                            other parts of digestive tract CPT copyright 2019 American [employer] Association. All rights reserved. The codes documented in this report are preliminary and upon coder review may  be revised to  meet current compliance requirements. Gatha Mayer, MD 03/26/2022 10:46:23 AM This report has been signed electronically. Number of Addenda: 0

## 2022-03-26 NOTE — Progress Notes (Signed)
Phone call to [facility] to discuss patient's return, voice mail left for Star, the  Admissions Director requesting a return call.   Sundeep Cary, LCSW Transition of Care

## 2022-03-26 NOTE — Discharge Summary (Signed)
Physician Discharge Summary   Brent Gip. ZJQ:[id number] DOB: [birth year] DOA: 03/25/2022  PCP: Raymondo Band, MD  Admit date: 03/25/2022 Discharge date:  03/26/2022  Admitted From: [facility] Disposition:  [place] Discharging physician: Dwyane Dee, MD  Recommendations for Outpatient Follow-up:  Follow up with Dr. Bobbye Morton. Needs to make appt for follow up Resume wound care  Discharge Condition: stable CODE STATUS: Full Diet recommendation:  Diet Orders (From admission, onward)     Start     Ordered   03/26/22 1117  Diet Carb Modified Fluid consistency: Thin; Room service appropriate? Yes  Diet effective now       Question Answer Comment  Calorie Level Medium 1600-2000   Fluid consistency: Thin   Room service appropriate? Yes      03/26/22 1116   03/26/22 0000  Diet Carb Modified        03/26/22 1701            Hospital Course: Brent [name] is a 76 yo male with PMH CAD, DMII, HTN, HLD, OSA, obesity, transverse myelitis, NMO on rituxan who presented with an ongoing leaking G-tube site.  He was referred to the hospital after IR attempted to cauterize the tract with silver nitrate. He underwent EGD after admission which showed a patent gastrostomy tract.  2 gastric polyps were removed.  There was no gastric outlet obstruction.  General surgery was then consulted for further evaluation.  He is being considered for possible surgical repair.  Other options would be to replace the G-tube or continue watchful monitoring. He will continue following with general surgery after discharge for further discussion.  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 - s/p EGD on 7/8; no outlet obstruction - tentative plan for outpatient follow up with surgery to discuss further plans  Chronic osteomyelitis of pelvic region, right ([place]) - Known osteomyelitis due to decubitus ulcer  of R ischeium. 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  On rituxan.  Last got in June. Next due in Dec.  Controlled type 2 diabetes mellitus with hyperglycemia, with long-term current use of insulin ([place]) - resume home regimen -A1c 9.3% on admission  Paraplegia [facility]) Due to history of transverse myelitis.  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     Discharge Instructions     Diet Carb Modified   Complete by: As directed    Increase activity slowly   Complete by: As directed       Allergies as of 03/26/2022       Reactions  Gabapentin Other (See Comments)   Unknown   Levofloxacin Rash   Penicillins Hives, Rash   Tolerated Unasyn, Augmentin, cefepime and cephalexin        Medication List     TAKE these medications    acetaminophen 500 MG tablet Commonly known as: TYLENOL Take 500 mg by mouth every 6 (six) hours as needed for moderate pain.   albuterol (2.5 MG/3ML) 0.083% nebulizer solution Commonly known as: PROVENTIL Take 3 mLs (2.5 mg total) by nebulization every 6 (six) hours as needed for wheezing or shortness of breath (or anaphylaxis due to Rituxan  infusion).   Alogliptin Benzoate 25 MG Tabs Take 25 mg by mouth daily.   aspirin EC 81 MG tablet Take 81 mg by mouth daily. Swallow whole.   atenolol 25 MG tablet Commonly known as: TENORMIN Take by mouth at bedtime.   atorvastatin 20 MG tablet Commonly known as: LIPITOR Take 1 tablet (20 mg total) by mouth daily. What changed: when to take this   azelastine 0.05 % ophthalmic solution Commonly known as: OPTIVAR Place 1 drop into both eyes 2 (two) times daily.   baclofen 10 MG tablet Commonly known as: LIORESAL Take 10 mg by mouth 3 (three) times daily.   Biofreeze 4 % Gel Generic drug: Menthol (Topical Analgesic) Apply 1 application  topically 2 (two) times daily. To back and shoulders   cephALEXin 250 MG capsule Commonly known as: KEFLEX Take 250 mg by mouth daily. contineous   Chloraseptic 1.4 % Liqd Generic drug: phenol Use as directed 2 sprays in the mouth or throat every 4 (four) hours as needed for throat irritation / pain.   cholecalciferol 25 MCG (1000 UNIT) tablet Commonly known as: VITAMIN D3 Take 3,000 Units by mouth daily.   famotidine 20 MG tablet Commonly known as: PEPCID Take 20 mg by mouth 2 (two) times daily.   ferrous gluconate 324 MG tablet Commonly known as: FERGON Take 324 mg by mouth every other day.   fluticasone 50 MCG/ACT nasal spray Commonly known as: FLONASE Place 2 sprays into both nostrils 2 (two) times daily.   GenTeal Tears Severe Day/Night 0.4-0.3 % Gel ophthalmic gel Generic drug: Polyethyl Glycol-Propyl Glycol Place 1 Application into both eyes in the morning, at noon, in the evening, and at bedtime. 2 drops qid   insulin aspart 100 UNIT/ML injection Commonly known as: novoLOG Inject 0-15 Units into the skin every 4 (four) hours. Sliding scale  CBG 70 - 120: 0 units: CBG 121 - 150: 2 units; CBG 151 - 200: 3 units; CBG 201 - 250: 5 units; CBG 251 - 300: 8 units;CBG 301 - 350: 11 units; CBG 351 - 400: 15 units; CBG > 400 : 15  units and notify MD What changed:  when to take this additional instructions   insulin glargine 100 unit/mL Sopn Commonly known as: LANTUS Inject 15 Units into the skin at bedtime. What changed: how much to take   losartan 50 MG tablet Commonly known as: COZAAR Take 50 mg by mouth daily.   Magnesium Oxide (Elemental) 400 MG Tabs Take 400 mg by mouth 3 (three) times daily.   metFORMIN 500 MG 24 hr tablet Commonly known as: GLUCOPHAGE-XR Take 1,000 mg by mouth in the morning and at bedtime.   nystatin powder Generic drug: nystatin Apply 1 Application topically daily.   OxyCODONE HCl (Abuse Deter) 5 MG Taba Commonly known as: OXAYDO Take 5 mg by mouth every 8 (eight) hours as needed (pain).  PSYLLIUM PO Take 0.8 g by mouth in the morning and at bedtime.   riTUXimab 100 MG/10ML injection Commonly known as: RITUXAN Inject 10 mg into the vein See admin instructions. Once between 12th -13th of June, December   ROBITUSSIN ALLERGY/COUGH PO Take 10 mLs by mouth every 6 (six) hours as needed (cough).   senna-docusate 8.6-50 MG tablet Commonly known as: Senokot-S Take 2 tablets by mouth daily.   sertraline 25 MG tablet Commonly known as: ZOLOFT Take 25 mg by mouth daily.   traZODone 50 MG tablet Commonly known as: DESYREL Take 75 mg by mouth at bedtime.   vitamin B-12 1000 MCG tablet Commonly known as: CYANOCOBALAMIN Take 2,000 mcg by mouth daily.   vitamin C 500 MG tablet Commonly known as: ASCORBIC ACID Take 500 mg by mouth daily.        Follow-up Information     Jesusita Oka, MD. Schedule an appointment as soon as possible for a visit in 2 week(s).   Specialty: Surgery Contact information: [address] [place] [address] [phone]                Allergies  Allergen Reactions   Gabapentin Other (See Comments)    Unknown   Levofloxacin Rash   Penicillins Hives and Rash    Tolerated Unasyn, Augmentin, cefepime and  cephalexin    Consultations: GI General surgery  Procedures: EGD, 03/26/22  Discharge Exam: BP 136/62 (BP Location: Left Arm)   Pulse 65   Temp 98.2 F (36.8 C) (Oral)   Resp 20   Ht 6' (1.829 m)   Wt 86.2 kg   SpO2 98%   BMI 25.77 kg/m  Physical Exam Constitutional:      General: He is not in acute distress.    Appearance: Normal appearance.  HENT:     Head: Normocephalic and atraumatic.     Mouth/Throat:     Mouth: Mucous membranes are moist.  Eyes:     Extraocular Movements: Extraocular movements intact.  Cardiovascular:     Rate and Rhythm: Normal rate and regular rhythm.     Heart sounds: Normal heart sounds.  Pulmonary:     Effort: Pulmonary effort is normal. No respiratory distress.     Breath sounds: Normal breath sounds. No wheezing.  Abdominal:     General: Bowel sounds are normal. There is no distension.     Palpations: Abdomen is soft.     Tenderness: There is no abdominal tenderness.     Comments: Prior G-tube site noted with ostomy bag in place with gas and stool in bag. Colostomy site noted with ostomy bag in place with stool in bag  Musculoskeletal:        General: Normal range of motion.     Cervical back: Normal range of motion and neck supple.  Skin:    General: Skin is warm and dry.  Neurological:     General: No focal deficit present.     Mental Status: He is alert.  Psychiatric:        Mood and Affect: Mood normal.        Behavior: Behavior normal.      The results of significant diagnostics from this hospitalization (including imaging, microbiology, ancillary and laboratory) are listed below for reference.   Microbiology: No results found for this or any previous visit (from the past 240 hour(s)).   Labs: BNP (last 3 results) Recent Labs    11/01/21 1439  BNP  56.3   Basic Metabolic Panel: Recent Labs  Lab 03/25/22 1840 03/26/22 0351  NA 135 137  K 4.1 3.7  CL 98 104  CO2 24 25  GLUCOSE 349* 202*  BUN 12 9  CREATININE  0.57* 0.54*  CALCIUM 9.4 8.9   Liver Function Tests: Recent Labs  Lab 03/25/22 1840  AST 16  ALT 14  ALKPHOS 119  BILITOT 0.4  PROT 6.5  ALBUMIN 3.0*   Recent Labs  Lab 03/25/22 1840  LIPASE 29   No results for input(s): "AMMONIA" in the last 168 hours. CBC: Recent Labs  Lab 03/25/22 1840 03/26/22 0351  WBC 10.0 7.9  NEUTROABS 7.8*  --   HGB 12.3* 10.5*  HCT 38.7* 32.8*  MCV 85.1 84.3  PLT 298 268   Cardiac Enzymes: No results for input(s): "CKTOTAL", "CKMB", "CKMBINDEX", "TROPONINI" in the last 168 hours. BNP: Invalid input(s): "POCBNP" CBG: Recent Labs  Lab 03/26/22 0424 03/26/22 0819 03/26/22 1035 03/26/22 1304 03/26/22 1507  GLUCAP 188* 211* 164* 185* 244*   D-Dimer No results for input(s): "DDIMER" in the last 72 hours. Hgb A1c Recent Labs    03/25/22 2108  HGBA1C 9.3*   Lipid Profile No results for input(s): "CHOL", "HDL", "LDLCALC", "TRIG", "CHOLHDL", "LDLDIRECT" in the last 72 hours. Thyroid function studies No results for input(s): "TSH", "T4TOTAL", "T3FREE", "THYROIDAB" in the last 72 hours.  Invalid input(s): "FREET3" Anemia work up No results for input(s): "VITAMINB12", "FOLATE", "FERRITIN", "TIBC", "IRON", "RETICCTPCT" in the last 72 hours. Urinalysis    Component Value Date/Time   COLORURINE YELLOW 03/25/2022 2311   APPEARANCEUR CLOUDY (A) 03/25/2022 2311   LABSPEC 1.029 03/25/2022 2311   PHURINE 5.0 03/25/2022 2311   GLUCOSEU >=500 (A) 03/25/2022 2311   HGBUR SMALL (A) 03/25/2022 2311   BILIRUBINUR NEGATIVE 03/25/2022 2311   BILIRUBINUR n 03/18/2015 0928   KETONESUR NEGATIVE 03/25/2022 2311   PROTEINUR 100 (A) 03/25/2022 2311   UROBILINOGEN 2.0 03/18/2015 0928   UROBILINOGEN 1.0 07/16/2012 1345   NITRITE NEGATIVE 03/25/2022 2311   LEUKOCYTESUR LARGE (A) 03/25/2022 2311   Sepsis Labs Recent Labs  Lab 03/25/22 1840 03/26/22 0351  WBC 10.0 7.9   Microbiology No results found for this or any previous visit (from the  past 240 hour(s)).  Procedures/Studies: DG Chest 1 View  Result Date: 03/25/2022 CLINICAL DATA:  Difficulty breathing EXAM: CHEST  1 VIEW COMPARISON:  11/01/2021 FINDINGS: Cardiac shadow is enlarged but stable. Lungs are clear. No bony abnormality is noted. IMPRESSION: Stable cardiomegaly.  No acute abnormality noted. Electronically Signed   By: Inez Catalina M.D.   On: 03/25/2022 23:52   CT Abdomen Pelvis W Contrast  Result Date: 03/25/2022 CLINICAL DATA:  Bowel obstruction suspected. Sent from facility physician for G-tube insertion site sutured. EXAM: CT ABDOMEN AND PELVIS WITH CONTRAST TECHNIQUE: Multidetector CT imaging of the abdomen and pelvis was performed using the standard protocol following bolus administration of intravenous contrast. RADIATION DOSE REDUCTION: This exam was performed according to the departmental dose-optimization program which includes automated exposure control, adjustment of the mA and/or kV according to patient size and/or use of iterative reconstruction technique. CONTRAST:  139m OMNIPAQUE IOHEXOL 300 MG/ML  SOLN COMPARISON:  08/06/2021. FINDINGS: Lower chest: The heart is normal in size and there is a trace pericardial effusion. Multi-vessel coronary artery calcifications are noted. There are trace bilateral pleural effusions with atelectasis at the lung bases. Hepatobiliary: No focal liver abnormality is seen. Status post cholecystectomy. No biliary dilatation. Pancreas: Unremarkable. No pancreatic  ductal dilatation or surrounding inflammatory changes. Spleen: Normal in size without focal abnormality. Adrenals/Urinary Tract: No adrenal nodule. There is redemonstration of a cyst in the upper pole of the left kidney with layering hyperdense material. An additional cyst is noted in the left kidney. Subcentimeter hypodensity is noted in the right kidney which is too small to further characterize. No definite renal calculus. Vascular calcifications are present at the renal hila  bilaterally. No hydronephrosis. A suprapubic catheter is noted. Stomach/Bowel: Percutaneous gastrostomy site without evidence of PEG tube. However, a tubular air-filled structure is noted in the gastric body. No bowel obstruction, free air, or pneumatosis. Scattered diverticula are present along the colon without evidence of diverticulitis. There is a left lower quadrant colostomy. The appendix is within normal limits. No bowel wall thickening or surrounding inflammatory changes. Vascular/Lymphatic: Aortic atherosclerosis. No enlarged abdominal or pelvic lymph nodes. Reproductive: Prostate is unremarkable. Other: Fat containing inguinal hernias are present bilaterally. No abdominopelvic ascites. Musculoskeletal: Gynecomastia is noted. There is a decubitus ulcer over the ischium on the right with bony erosions, similar in appearance to the prior exam. Degenerative changes are present in the thoracolumbar spine. No acute osseous abnormality. IMPRESSION: 1. No evidence of bowel obstruction or free air. 2. Percutaneous gastrostomy site without evidence of PEG tube. An air-fluid tubular structure is noted in the gastric body. Correlate clinically to exclude residual foreign body. 3. Diverticulosis without diverticulitis. 4. Decubitus ulcer over the ischium on the right with erosive bony changes, not significantly changed from the prior exam, possible chronic osteomyelitis. 5. Stable renal cysts. 6. Aortic atherosclerosis and coronary artery calcifications. 7. Remaining incidental findings as described above. Critical findings were reported to Dr. Isla Pence at 8:23 p.m. Electronically Signed   By: Brett Fairy M.D.   On: 03/25/2022 20:25     Time coordinating discharge: Over 52 minutes    Dwyane Dee, MD  [facility] 03/26/2022, 5:02 PM

## 2022-03-26 NOTE — TOC Progression Note (Signed)
Transition of Care (TOC) - Progression Note    Patient Details  Name: Brent [name]. MRN: [id number] Date of Birth: Jun 08, 1946  Transition of Care [facility]) CM/SW Contact  [address] [place], [place] Phone Number: 03/26/2022, 4:00 PM  Clinical Narrative:     Phone call to [facility], message left with Admissions Coordinator previously regarding patient's return. Phone call made to the facility directly with a request for a return call from the supervising nurse on his floor.    Kia Varnadore, LCSW Transition of Care         Expected Discharge Plan and Services                                                 Social Determinants of Health (SDOH) Interventions    Readmission Risk Interventions    06/08/2020   11:45 AM  Readmission Risk Prevention Plan  Transportation Screening Complete  Medication Review (RN Care Manager) Referral to Pharmacy  PCP or Specialist appointment within 3-5 days of discharge Not Complete  PCP/Specialist Appt Not Complete comments SNF resident  [facility] or [place] Complete  SW Recovery Care/Counseling Consult Complete  Palliative Care Screening Complete  Skilled Nursing Facility Complete

## 2022-03-26 NOTE — Consult Note (Signed)
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 is an 76 y.o. male.  HPI: 76yo M with PMHx as below including paraplegia from tansverse myelitis since 2021. He is known to our practice S/P laparoscopic loop sigmoid colostomy and laparoscopic gastrostomy by Dr. Bobbye Morton 06/04/20. He has recovered the ability to swallow and has not needed the G tube for several months. It was removed by IR 02/10/22. Since then he has been leaking gastric contents from the site, often within 41mn of eating. IR saw him 7/6 and applied some silver nitrate to the site. It continues to leak so he underwent EGD by Dr. GCarlean Purltoday for further evaluation. 2 polyps were biopsied but no GOO or other complicating feature was found. We are asked to see for surgical management.  Past Medical History:  Diagnosis Date   Allergy    CAD (coronary artery disease)    a. angioplasty of his RCA in 1990. b. bare metal stent placed in the RCA in 2000 followed by rotational atherectomy shortly after for stent restenosis. c. last cath was in 2012 showing stable moderate diffuse CAD. (70% mid LAD, 80% diagonal, 70% Ramus, 40% mid to distal RCA stent restenosis). d. Low risk nuc in 2015.   Diabetes mellitus    Diverticulosis    Elevated CK    Erectile dysfunction    Hemorrhoids    HTN (hypertension)    Hyperlipidemia    Hypertriglyceridemia    Malignant [place] of [place] 10/25/[place]   Myocardial infarction (HCC)    Obesity    OSA (obstructive sleep apnea)    Persistent disorder of initiating or maintaining sleep    Personal history of colonic polyps 02/05/2003    Past Surgical History:  Procedure Laterality Date   CHOLECYSTECTOMY     CORONARY ANGIOPLASTY     CORONARY STENT PLACEMENT     stenting of the right coronary artery with followup rotational  atherectomy. (3 stents placed)   FINGER SURGERY     right   FOOT SURGERY     right    INGUINAL HERNIA REPAIR     right   IR GASTROSTOMY TUBE REMOVAL  02/10/2022   IRRIGATION AND DEBRIDEMENT ABSCESS N/A 06/04/2020   Procedure: IRRIGATION AND DEBRIDEMENT SACRAL WOUND;  Surgeon: LJesusita Oka MD;  [facility] [facility]  Service: General;  Laterality: N/A;   LAPAROSCOPY N/A 06/04/2020   Procedure: LAPAROSCOPY ASSISTED COLOSTOMY, LAPAROSCOPY GASTROSTOMY;  Surgeon: LJesusita Oka MD;  [facility] [facility]  Service: General;  Laterality: N/A;   LYSIS OF ADHESION N/A 06/04/2020   Procedure: LYSIS OF ADHESION;  Surgeon: LJesusita Oka MD;  [place] [facility]  Service: General;  Laterality: N/A;   melanoma removal     neck   ORTHOPEDIC SURGERY     foot right   rotator cuff surg     Bil    Family History  Problem Relation Age of Onset   Liver cancer Father    Lung cancer Father    Heart disease Father    Heart disease Sister    Lung cancer Mother    COPD Mother    Obesity Mother    Colon cancer Neg Hx    Stomach cancer Neg Hx    Pancreatic cancer Neg Hx     Social History:  reports that he quit smoking about 41 years ago. His smoking use included cigarettes. He has a 45.00 pack-year smoking history.  He has never used smokeless tobacco. He reports that he does not currently use alcohol after a past usage of about 7.0 - 14.0 standard drinks of alcohol per week. He reports that he does not use drugs.  Allergies:  Allergies  Allergen Reactions   Gabapentin Other (See Comments)    Unknown   Levofloxacin Rash   Penicillins Hives and Rash    Tolerated Unasyn, Augmentin, cefepime and cephalexin    Medications: I have reviewed the patient's current medications.  Results for orders placed or performed during the hospital encounter of 03/25/22 (from the past 48 hour(s))  CBC with Differential     Status: Abnormal   Collection Time: 03/25/22  6:40 PM  Result Value Ref Range   WBC 10.0 4.0 - 10.5 K/uL   RBC 4.55 4.22 - 5.81 MIL/uL   Hemoglobin 12.3 (L) 13.0 - 17.0 g/dL   HCT  38.7 (L) 39.0 - 52.0 %   MCV 85.1 80.0 - 100.0 fL   MCH 27.0 26.0 - 34.0 pg   MCHC 31.8 30.0 - 36.0 g/dL   RDW 14.8 11.5 - 15.5 %   Platelets 298 150 - 400 K/uL   nRBC 0.0 0.0 - 0.2 %   Neutrophils Relative % 78 %   Neutro Abs 7.8 (H) 1.7 - 7.7 K/uL   Lymphocytes Relative 11 %   Lymphs Abs 1.1 0.7 - 4.0 K/uL   Monocytes Relative 8 %   Monocytes Absolute 0.8 0.1 - 1.0 K/uL   Eosinophils Relative 2 %   Eosinophils Absolute 0.2 0.0 - 0.5 K/uL   Basophils Relative 0 %   Basophils Absolute 0.0 0.0 - 0.1 K/uL   Immature Granulocytes 1 %   Abs Immature Granulocytes 0.06 0.00 - 0.07 K/uL    Comment: Performed at [facility], [address], [place], [place] [address]  Comprehensive metabolic panel     Status: Abnormal   Collection Time: 03/25/22  6:40 PM  Result Value Ref Range   Sodium 135 135 - 145 mmol/L   Potassium 4.1 3.5 - 5.1 mmol/L   Chloride 98 98 - 111 mmol/L   CO2 24 22 - 32 mmol/L   Glucose, Bld 349 (H) 70 - 99 mg/dL    Comment: Glucose reference range applies only to samples taken after fasting for at least 8 hours.   BUN 12 8 - 23 mg/dL   Creatinine, Ser 0.57 (L) 0.61 - 1.24 mg/dL   Calcium 9.4 8.9 - 10.3 mg/dL   Total Protein 6.5 6.5 - 8.1 g/dL   Albumin 3.0 (L) 3.5 - 5.0 g/dL   AST 16 15 - 41 U/L   ALT 14 0 - 44 U/L   Alkaline Phosphatase 119 38 - 126 U/L   Total Bilirubin 0.4 0.3 - 1.2 mg/dL   GFR, Estimated >60 >60 mL/min    Comment: (NOTE) Calculated using the CKD-EPI Creatinine Equation (2021)    Anion gap 13 5 - 15    Comment: Performed at [facility] [address] [place], [place] [address]  Lipase, blood     Status: None   Collection Time: 03/25/22  6:40 PM  Result Value Ref Range   Lipase 29 11 - 51 U/L    Comment: Performed at [facility] [address] [place],  [address]  Hemoglobin A1c     Status: Abnormal   Collection Time: 03/25/22  9:08 PM  Result Value Ref Range   Hgb A1c MFr  Bld 9.3 (H) 4.8 - 5.6 %     Comment: (NOTE) Pre diabetes:          5.7%-6.4%  Diabetes:              >6.4%  Glycemic control for   <7.0% adults with diabetes    Mean Plasma Glucose 220.21 mg/dL    Comment: Performed at [facility], [place] [address], [place], [place] [address]  CBG monitoring, ED     Status: Abnormal   Collection Time: 03/25/22  9:59 PM  Result Value Ref Range   Glucose-Capillary 286 (H) 70 - 99 mg/dL    Comment: Glucose reference range applies only to samples taken after fasting for at least 8 hours.  Urinalysis, Routine w reflex microscopic Urine, Suprapubic     Status: Abnormal   Collection Time: 03/25/22 11:11 PM  Result Value Ref Range   Color, Urine YELLOW YELLOW   APPearance CLOUDY (A) CLEAR   Specific Gravity, Urine 1.029 1.005 - 1.030   pH 5.0 5.0 - 8.0   Glucose, UA >=500 (A) NEGATIVE mg/dL   Hgb urine dipstick SMALL (A) NEGATIVE   Bilirubin Urine NEGATIVE NEGATIVE   Ketones, ur NEGATIVE NEGATIVE mg/dL   Protein, ur 100 (A) NEGATIVE mg/dL   Nitrite NEGATIVE NEGATIVE   Leukocytes,Ua LARGE (A) NEGATIVE   RBC / HPF 21-50 0 - 5 RBC/hpf   WBC, UA >50 (H) 0 - 5 WBC/hpf   Bacteria, UA FEW (A) NONE SEEN   WBC Clumps PRESENT    Mucus PRESENT     Comment: Performed at [facility], [address] [place], [place] [address]  CBG monitoring, ED     Status: Abnormal   Collection Time: 03/26/22 12:58 AM  Result Value Ref Range   Glucose-Capillary 247 (H) 70 - 99 mg/dL    Comment: Glucose reference range applies only to samples taken after fasting for at least 8 hours.  CBC     Status: Abnormal   Collection Time: 03/26/22  3:51 AM  Result Value Ref Range   WBC 7.9 4.0 - 10.5 K/uL   RBC 3.89 (L) 4.22 - 5.81 MIL/uL   Hemoglobin 10.5 (L) 13.0 - 17.0 g/dL   HCT 32.8 (L) 39.0 - 52.0 %   MCV 84.3 80.0 - 100.0 fL   MCH 27.0 26.0 - 34.0 pg   MCHC 32.0 30.0 - 36.0 g/dL   RDW 14.9 11.5 - 15.5 %   Platelets 268 150 - 400 K/uL   nRBC 0.0 0.0 - 0.2 %    Comment: Performed at [facility], [place] [address], [place], [place] [address]  Basic metabolic panel     Status: Abnormal   Collection Time: 03/26/22  3:51 AM  Result Value Ref Range   Sodium 137 135 - 145 mmol/L   Potassium 3.7 3.5 - 5.1 mmol/L   Chloride 104 98 - 111 mmol/L   CO2 25 22 - 32 mmol/L   Glucose, Bld 202 (H) 70 - 99 mg/dL    Comment: Glucose reference range applies only to samples taken after fasting for at least 8 hours.   BUN 9 8 - 23 mg/dL   Creatinine, Ser 0.54 (L) 0.61 - 1.24 mg/dL   Calcium 8.9 8.9 - 10.3 mg/dL   GFR, Estimated >60 >60 mL/min    Comment: (NOTE) Calculated using the CKD-EPI Creatinine Equation (2021)    Anion gap 8 5 - 15    Comment: Performed  at [facility], [place] [address], [place], [place] [address]  CBG monitoring, ED     Status: Abnormal   Collection Time: 03/26/22  4:24 AM  Result Value Ref Range   Glucose-Capillary 188 (H) 70 - 99 mg/dL    Comment: Glucose reference range applies only to samples taken after fasting for at least 8 hours.  CBG monitoring, ED     Status: Abnormal   Collection Time: 03/26/22  8:19 AM  Result Value Ref Range   Glucose-Capillary 211 (H) 70 - 99 mg/dL    Comment: Glucose reference range applies only to samples taken after fasting for at least 8 hours.  Glucose, capillary     Status: Abnormal   Collection Time: 03/26/22 10:35 AM  Result Value Ref Range   Glucose-Capillary 164 (H) 70 - 99 mg/dL    Comment: Glucose reference range applies only to samples taken after fasting for at least 8 hours.  Glucose, capillary     Status: Abnormal   Collection Time: 03/26/22  1:04 PM  Result Value Ref Range   Glucose-Capillary 185 (H) 70 - 99 mg/dL    Comment: Glucose reference range applies only to samples taken after fasting for at least 8 hours.    DG Chest 1 View  Result Date: 03/25/2022 CLINICAL DATA:  Difficulty breathing EXAM: CHEST  1 VIEW COMPARISON:  11/01/2021 FINDINGS: Cardiac shadow is enlarged but stable. Lungs are clear.  No bony abnormality is noted. IMPRESSION: Stable cardiomegaly.  No acute abnormality noted. Electronically Signed   By: Inez Catalina M.D.   On: 03/25/2022 23:52   CT Abdomen Pelvis W Contrast  Result Date: 03/25/2022 CLINICAL DATA:  Bowel obstruction suspected. Sent from facility physician for G-tube insertion site sutured. EXAM: CT ABDOMEN AND PELVIS WITH CONTRAST TECHNIQUE: Multidetector CT imaging of the abdomen and pelvis was performed using the standard protocol following bolus administration of intravenous contrast. RADIATION DOSE REDUCTION: This exam was performed according to the departmental dose-optimization program which includes automated exposure control, adjustment of the mA and/or kV according to patient size and/or use of iterative reconstruction technique. CONTRAST:  138m OMNIPAQUE IOHEXOL 300 MG/ML  SOLN COMPARISON:  08/06/2021. FINDINGS: Lower chest: The heart is normal in size and there is a trace pericardial effusion. Multi-vessel coronary artery calcifications are noted. There are trace bilateral pleural effusions with atelectasis at the lung bases. Hepatobiliary: No focal liver abnormality is seen. Status post cholecystectomy. No biliary dilatation. Pancreas: Unremarkable. No pancreatic ductal dilatation or surrounding inflammatory changes. Spleen: Normal in size without focal abnormality. Adrenals/Urinary Tract: No adrenal nodule. There is redemonstration of a cyst in the upper pole of the left kidney with layering hyperdense material. An additional cyst is noted in the left kidney. Subcentimeter hypodensity is noted in the right kidney which is too small to further characterize. No definite renal calculus. Vascular calcifications are present at the renal hila bilaterally. No hydronephrosis. A suprapubic catheter is noted. Stomach/Bowel: Percutaneous gastrostomy site without evidence of PEG tube. However, a tubular air-filled structure is noted in the gastric body. No bowel obstruction,  free air, or pneumatosis. Scattered diverticula are present along the colon without evidence of diverticulitis. There is a left lower quadrant colostomy. The appendix is within normal limits. No bowel wall thickening or surrounding inflammatory changes. Vascular/Lymphatic: Aortic atherosclerosis. No enlarged abdominal or pelvic lymph nodes. Reproductive: Prostate is unremarkable. Other: Fat containing inguinal hernias are present bilaterally. No abdominopelvic ascites. Musculoskeletal: Gynecomastia is noted. There is a decubitus ulcer  over the ischium on the right with bony erosions, similar in appearance to the prior exam. Degenerative changes are present in the thoracolumbar spine. No acute osseous abnormality. IMPRESSION: 1. No evidence of bowel obstruction or free air. 2. Percutaneous gastrostomy site without evidence of PEG tube. An air-fluid tubular structure is noted in the gastric body. Correlate clinically to exclude residual foreign body. 3. Diverticulosis without diverticulitis. 4. Decubitus ulcer over the ischium on the right with erosive bony changes, not significantly changed from the prior exam, possible chronic osteomyelitis. 5. Stable renal cysts. 6. Aortic atherosclerosis and coronary artery calcifications. 7. Remaining incidental findings as described above. Critical findings were reported to Dr. Isla Pence at 8:23 p.m. Electronically Signed   By: Brett Fairy M.D.   On: 03/25/2022 20:25    Review of Systems  Constitutional: Negative.   HENT: Negative.    Eyes: Negative.   Respiratory: Negative.    Cardiovascular: Negative.   Gastrointestinal:        See HPI  Endocrine: Negative.   Genitourinary: Negative.        SP tube placed by Urology recently  Musculoskeletal: Negative.   Allergic/Immunologic: Negative.   Neurological:        Paraplegia  Hematological: Negative.   Psychiatric/Behavioral: Negative.     Blood pressure (!) 155/61, pulse (!) 57, temperature 98.2 F (36.8  C), temperature source Oral, resp. rate 18, height 6' (1.829 m), weight 86.2 kg, SpO2 98 %. Physical Exam HENT:     Head: Normocephalic.  Eyes:     Pupils: Pupils are equal, round, and reactive to light.  Cardiovascular:     Rate and Rhythm: Normal rate and regular rhythm.  Pulmonary:     Effort: Pulmonary effort is normal.     Breath sounds: Normal breath sounds.  Abdominal:     General: Abdomen is flat.     Palpations: Abdomen is soft.     Comments: LLQ colostomy functional, G tube site with bag and gastric content leaking, SP tube, NT, ND  Skin:    General: Skin is warm.  Neurological:     Mental Status: He is alert and oriented to person, place, and time.  Psychiatric:        Mood and Affect: Mood normal.     Assessment/Plan: S/P laparoscopic loop sigmoid colostomy and laparoscopic gastrostomy by Dr. Bobbye Morton 06/04/20  Gastrocutaneous fistula  G tube removed by IR and he has a gastrocutaneous fistula at the site.  Options for management include: 1. continued observation with hopeful closure over time 2. IR to replace a G-tube which would remain clamped in order to prevent leakage 3.  Laparoscopic versus open repair of gastrocutaneous fistula  I spoke with him at length.  He feels the drainage has been getting worse as opposed to better.  He has been living at [facility] for 2 years and was trying to prepare to transition to living at home.  This plan included removal of his G-tube and placement of suprapubic tube.  If we were to consider surgical repair, we definitely need to wait until the pathology from his polypectomy x2 has been completed.  We will follow him and discuss further plans accordingly.  Zenovia Jarred 03/26/2022, 3:07 PM

## 2022-03-26 NOTE — Anesthesia Procedure Notes (Signed)
Procedure Name: MAC Date/Time: 03/26/2022 10:12 AM  Performed by: Moshe Salisbury, CRNAPre-anesthesia Checklist: Patient identified, Emergency Drugs available, Suction available and Patient being monitored Patient Re-evaluated:Patient Re-evaluated prior to induction Oxygen Delivery Method: Nasal cannula Placement Confirmation: positive ETCO2 Dental Injury: Teeth and Oropharynx as per pre-operative assessment

## 2022-03-26 NOTE — Anesthesia Preprocedure Evaluation (Addendum)
Anesthesia Evaluation  Patient identified by MRN, date of birth, ID band Patient awake    Reviewed: Allergy & Precautions, NPO status , Patient's Chart, lab work & pertinent test results, reviewed documented beta blocker date and time   History of Anesthesia Complications Negative for: history of anesthetic complications  Airway Mallampati: III  TM Distance: >3 FB Neck ROM: Full    Dental  (+) Dental Advisory Given, Teeth Intact   Pulmonary sleep apnea , former smoker,    Pulmonary exam normal        Cardiovascular hypertension, Pt. on medications and Pt. on home beta blockers + CAD, + Past MI, + Cardiac Stents and + Peripheral Vascular Disease  Normal cardiovascular exam     Neuro/Psych PSYCHIATRIC DISORDERS Depression  Transverse myelitis Occipital neuralgia Neuromyelitis optica   Neuromuscular disease (paraplegia)    GI/Hepatic Neg liver ROS,  S/p g-tube with non-healing site    Endo/Other  diabetes, Type 2, Insulin Dependent, Oral Hypoglycemic Agents  Renal/GU negative Renal ROS Bladder dysfunction      Musculoskeletal  Myofascial pain syndrome    Abdominal   Peds  Hematology  (+) Blood dyscrasia, anemia ,   Anesthesia Other Findings   Reproductive/Obstetrics                            Anesthesia Physical Anesthesia Plan  ASA: 4  Anesthesia Plan: MAC   Post-op Pain Management: Minimal or no pain anticipated   Induction:   PONV Risk Score and Plan: 1 and Propofol infusion and Treatment may vary due to age or medical condition  Airway Management Planned: Nasal Cannula and Natural Airway  Additional Equipment: None  Intra-op Plan:   Post-operative Plan:   Informed Consent: I have reviewed the patients History and Physical, chart, labs and discussed the procedure including the risks, benefits and alternatives for the proposed anesthesia with the patient or authorized  representative who has indicated his/her understanding and acceptance.       Plan Discussed with: CRNA and Anesthesiologist  Anesthesia Plan Comments:        Anesthesia Quick Evaluation

## 2022-03-26 NOTE — Anesthesia Postprocedure Evaluation (Signed)
Anesthesia Post Note  Patient: Brent [name].  Procedure(s) Performed: ESOPHAGOGASTRODUODENOSCOPY (EGD) WITH PROPOFOL BIOPSY     Patient location during evaluation: PACU Anesthesia Type: MAC Level of consciousness: awake and alert Pain management: pain level controlled Vital Signs Assessment: post-procedure vital signs reviewed and stable Respiratory status: spontaneous breathing, nonlabored ventilation and respiratory function stable Cardiovascular status: stable and blood pressure returned to baseline Anesthetic complications: no   No notable events documented.  Last Vitals:  Vitals:   03/26/22 1055 03/26/22 1135  BP: (!) 149/57 (!) 155/61  Pulse: (!) 57 (!) 57  Resp: 18 18  Temp:  36.8 C  SpO2: 98%     Last Pain:  Vitals:   03/26/22 1135  TempSrc: Oral  PainSc:                  Audry Pili

## 2022-03-26 NOTE — Progress Notes (Signed)
Patient off the floor for GI procedure at this time.

## 2022-03-27 ENCOUNTER — Encounter (HOSPITAL_COMMUNITY): Payer: Self-pay | Admitting: Internal Medicine

## 2022-03-27 NOTE — Telephone Encounter (Signed)
Mir,    Thanks for your reply.    This patient was admitted over the weekend and underwent EGD without findings of GOO. Discharge summary indicates surgery was consulted. I do not know where that stands, but they may reach out to you for assistance.(G tube originally placed by Dr Bobbye Morton, seen by Dr Georganna Skeans in hospital)  - Wilfrid Lund

## 2022-03-28 ENCOUNTER — Ambulatory Visit: Payer: Medicare Other | Admitting: Gastroenterology

## 2022-03-29 ENCOUNTER — Telehealth: Payer: Self-pay | Admitting: Pharmacist

## 2022-03-29 NOTE — Chronic Care Management (AMB) (Signed)
    Chronic Care Management Pharmacy Assistant   Name: Lason Eveland.  MRN: [id number] DOB: 12-Jun-1946  03/30/2022 APPOINTMENT [place] [facility]. was reminded to have all medications, supplements and any blood glucose and blood pressure readings available for review with Jeni Salles, Pharm. D, at his telephone visit on 03/30/2022 at 1:00.  Care Gaps: AWV - previous message sent to Ramond Craver Last BP - 126/53 on 11/01/2021 (ED visit) Last A1C - 9.3 on 03/25/2022 Shingrix - never done Covid booster - overdue Eye exam - overdue Foot exam - overdue Colonoscopy - overdue  Star Rating Drug: Atorvastatin 20 mg - last filled 02/28/2022 30 DS at [facility] Losartan 50 mg - last filled 03/29/2022 30 DS at [facility] Rx verified with pharmacist Metformin 500 mg - last filled 03/29/2022 30 DS at [facility] verified with pharmacist  Any gaps in medications fill history? No  Gennie Alma [facility]  Catering manager [phone]

## 2022-03-30 ENCOUNTER — Telehealth: Payer: Medicare Other

## 2022-03-31 LAB — SURGICAL PATHOLOGY

## 2022-04-04 ENCOUNTER — Encounter: Payer: Self-pay | Admitting: Internal Medicine

## 2022-04-04 DIAGNOSIS — D3A8 Other benign neuroendocrine tumors: Secondary | ICD-10-CM | POA: Insufficient documentation

## 2022-04-04 HISTORY — DX: Other benign neuroendocrine tumors: D3A.8

## 2022-04-05 ENCOUNTER — Other Ambulatory Visit: Payer: Self-pay

## 2022-04-05 ENCOUNTER — Telehealth: Payer: Self-pay | Admitting: Internal Medicine

## 2022-04-05 NOTE — Telephone Encounter (Signed)
Patient returned your call, please advise. 

## 2022-04-05 NOTE — Telephone Encounter (Signed)
Spoke to pt: documented under result notes

## 2022-04-27 ENCOUNTER — Encounter (INDEPENDENT_AMBULATORY_CARE_PROVIDER_SITE_OTHER): Payer: Self-pay

## 2022-05-20 ENCOUNTER — Telehealth: Payer: Self-pay | Admitting: Neurology

## 2022-05-20 NOTE — Telephone Encounter (Signed)
Pt wife is calling Administrator) states Pt is harding promblems with his wrist. [name] states wrist will hardly move and wants to talk to Dr. Felecia Shelling about this.

## 2022-05-24 ENCOUNTER — Encounter: Payer: Self-pay | Admitting: *Deleted

## 2022-05-24 NOTE — Telephone Encounter (Signed)
Called pt. Relayed Dr. Garth Bigness message. He verbalized understanding and agreeable to try increased dose of baclofen. Faxed order to [facility] at [phone]. Received fax confirmation.

## 2022-05-24 NOTE — Telephone Encounter (Signed)
Called back. Spoke w/ pt. L wrist stiff/unable to move. Sx worsened last week. Has worsened gradually. No new meds started recently. No illness/infection recently.   He had G-tube for about 1.45yr Had it taken out Feb 13, 2022. Told it should heal up within 24-48 hr. Has not healed up. Fluid coming out. Went to surgeon to see if intervention needed. He was told by surgeon that Rituxan reduces healing.  Bag placed over site to protect. Next Rituxan due around 07/2022.

## 2022-05-25 ENCOUNTER — Ambulatory Visit: Payer: Medicare Other | Admitting: Podiatry

## 2022-05-26 ENCOUNTER — Other Ambulatory Visit (INDEPENDENT_AMBULATORY_CARE_PROVIDER_SITE_OTHER): Payer: Medicare Other

## 2022-05-26 ENCOUNTER — Ambulatory Visit (INDEPENDENT_AMBULATORY_CARE_PROVIDER_SITE_OTHER): Payer: Medicare Other | Admitting: Gastroenterology

## 2022-05-26 ENCOUNTER — Encounter: Payer: Self-pay | Admitting: Gastroenterology

## 2022-05-26 VITALS — BP 130/74 | HR 63 | Ht 72.0 in | Wt 199.0 lb

## 2022-05-26 DIAGNOSIS — R933 Abnormal findings on diagnostic imaging of other parts of digestive tract: Secondary | ICD-10-CM

## 2022-05-26 DIAGNOSIS — K316 Fistula of stomach and duodenum: Secondary | ICD-10-CM

## 2022-05-26 DIAGNOSIS — D3A8 Other benign neuroendocrine tumors: Secondary | ICD-10-CM

## 2022-05-26 DIAGNOSIS — C7A8 Other malignant neuroendocrine tumors: Secondary | ICD-10-CM | POA: Diagnosis not present

## 2022-05-26 DIAGNOSIS — Z01818 Encounter for other preprocedural examination: Secondary | ICD-10-CM | POA: Diagnosis not present

## 2022-05-26 LAB — BASIC METABOLIC PANEL
BUN: 11 mg/dL (ref 6–23)
CO2: 26 mEq/L (ref 19–32)
Calcium: 9.4 mg/dL (ref 8.4–10.5)
Chloride: 101 mEq/L (ref 96–112)
Creatinine, Ser: 0.54 mg/dL (ref 0.40–1.50)
GFR: 96 mL/min (ref >60.00)
Glucose, Bld: 256 mg/dL — ABNORMAL HIGH (ref 70–99)
Potassium: 4.4 mEq/L (ref 3.5–5.1)
Sodium: 136 mEq/L (ref 135–145)

## 2022-05-26 LAB — CBC
HCT: 36.7 % — ABNORMAL LOW (ref 39.0–52.0)
Hemoglobin: 11.8 g/dL — ABNORMAL LOW (ref 13.0–17.0)
MCHC: 32.2 g/dL (ref 30.0–36.0)
MCV: 82.5 fl (ref 78.0–100.0)
Platelets: 314 10*3/uL (ref 150.0–400.0)
RBC: 4.45 Mil/uL (ref 4.22–5.81)
RDW: 16.6 % — ABNORMAL HIGH (ref 11.5–15.5)
WBC: 9.5 10*3/uL (ref 4.0–10.5)

## 2022-05-26 LAB — PROTIME-INR
INR: 1 ratio (ref 0.8–1.0)
Prothrombin Time: 10.7 s (ref 9.6–13.1)

## 2022-05-26 NOTE — Patient Instructions (Signed)
_______________________________________________________  If you are age 78 or older, your body mass index should be between 23-30. Your Body mass index is 26.99 kg/m. If this is out of the aforementioned range listed, please consider follow up with your Primary Care Provider. _______________________________________________________  The [facility] providers would like to encourage you to use [facility] to communicate with providers for non-urgent requests or questions.  Due to long hold times on the telephone, sending your provider a message by [facility] may be a faster and more efficient way to get a response.  Please allow 48 business hours for a response.  Please remember that this is for non-urgent requests.  _______________________________________________________  Your provider has requested that you go to the basement level for lab work before leaving today. Press "B" on the elevator. The lab is located at the first door on the left as you exit the elevator.  You have been scheduled for an endoscopy/EUS. Please follow written instructions given to you at your visit today. If you use inhalers (even only as needed), please bring them with you on the day of your procedure.  Due to recent changes in healthcare laws, you may see the results of your imaging and laboratory studies on MyChart before your provider has had a chance to review them.  We understand that in some cases there may be results that are confusing or concerning to you. Not all laboratory results come back in the same time frame and the provider may be waiting for multiple results in order to interpret others.  Please give [place] 48 hours in order for your provider to thoroughly review all the results before contacting the office for clarification of your results.   Thank you for entrusting me with your care and choosing [facility].  Dr Rush Landmark

## 2022-05-27 ENCOUNTER — Encounter: Payer: Self-pay | Admitting: Gastroenterology

## 2022-05-27 ENCOUNTER — Other Ambulatory Visit: Payer: Self-pay | Admitting: Gastroenterology

## 2022-05-27 ENCOUNTER — Other Ambulatory Visit (INDEPENDENT_AMBULATORY_CARE_PROVIDER_SITE_OTHER): Payer: Medicare Other

## 2022-05-27 DIAGNOSIS — Z01818 Encounter for other preprocedural examination: Secondary | ICD-10-CM | POA: Insufficient documentation

## 2022-05-27 DIAGNOSIS — R933 Abnormal findings on diagnostic imaging of other parts of digestive tract: Secondary | ICD-10-CM

## 2022-05-27 DIAGNOSIS — C7A8 Other malignant neuroendocrine tumors: Secondary | ICD-10-CM | POA: Insufficient documentation

## 2022-05-27 LAB — FERRITIN: Ferritin: 18 ng/mL — ABNORMAL LOW (ref 22.0–322.0)

## 2022-05-27 LAB — B12 AND FOLATE PANEL
Folate: 17.9 ng/mL (ref >5.9)
Vitamin B-12: 749 pg/mL (ref 211–911)

## 2022-05-27 LAB — IBC PANEL
Iron: 38 ug/dL — ABNORMAL LOW (ref 42–165)
Saturation Ratios: 11.6 % — ABNORMAL LOW (ref 20.0–50.0)
TIBC: 329 ug/dL (ref 250.0–450.0)
Transferrin: 235 mg/dL (ref 212.0–360.0)

## 2022-05-27 LAB — CHROMOGRANIN A: Chromogranin A (ng/mL): 133.1 ng/mL — ABNORMAL HIGH (ref 0.0–101.8)

## 2022-05-27 NOTE — Progress Notes (Signed)
[place] VISIT   Primary Care Provider Raymondo Band, MD [place] [address] [phone]  Referring Provider Dr. Carlean Purl and Dr. Loletha Carrow  Patient Profile: Brent [name]. is a 76 y.o. 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  The patient presents to the [facility] for an evaluation and management of problem(s) noted below:  Problem List 1. Gastrocutaneous fistula   2. Primary malignant neuroendocrine tumor of stomach ([place])   3. Preoperative examination   4. Abnormal endoscopy of upper gastrointestinal tract     History of Present Illness Please see prior GI notes for full details of HPI.  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  An upper endoscopy was performed by Dr. Carlean Purl.  He was found to have multiple gastric polyps with biopsy that did show evidence of a gastric neuroendocrine tumor.  A tract was seen in the body of the stomach.  He was able to be discharged.  Reading through the notes he has had attempted by interventional radiology to silver nitrate cauterized the outer tract without success.  He has been seen by general surgery, Dr. Tomasita Morrow, and felt to be a high risk surgical candidate for operative options.  He continues to experience output from his gastrocutaneous fistula sometimes even seeing some of the pills that he is ingested.  He has to have his gastrocutaneous fistula ostomy bag changed at least every couple of days due to it filling up.  No change in his colostomy output.  He is hopeful to have improvement in his symptoms and is willing to undergo  any procedures that could be helpful.  GI Review of Systems Positive as above Negative for dysphagia, odynophagia, nausea, vomiting, melena per ostomy or hematochezia per ostomy  Review of Systems General: Denies fevers/chills/weight loss unintentionally Cardiovascular: Denies chest pain Pulmonary: Denies shortness of breath Gastroenterological: See HPI Genitourinary: Denies darkened urine Hematological: Denies easy bruising/bleeding Dermatological: Denies jaundice Psychological: Mood is stable   Medications Current Outpatient Medications  Medication Sig Dispense Refill   acetaminophen (TYLENOL) 500 MG tablet Take 500 mg by mouth every 6 (six) hours as needed for moderate pain.     albuterol (PROVENTIL) (2.5 MG/3ML) 0.083% nebulizer solution Take 3 mLs (2.5 mg total) by nebulization every 6 (six) hours as needed for wheezing or shortness of breath (or anaphylaxis due to Rituxan infusion).     Alogliptin Benzoate 25 MG TABS Take 25 mg by mouth daily.     aspirin EC 81 MG tablet Take 81 mg by mouth daily. Swallow whole.     atenolol (TENORMIN) 25 MG tablet Take by mouth at bedtime.     atorvastatin (LIPITOR) 20 MG tablet Take 1 tablet (20 mg total) by mouth daily. (Patient taking differently: Take 20 mg by mouth at bedtime.)     azelastine (OPTIVAR) 0.05 % ophthalmic solution Place 1 drop into both eyes 2 (two) times daily.     baclofen (LIORESAL) 10 MG tablet Take 10 mg by mouth 3 (three) times daily.     cephALEXin (KEFLEX) 250 MG capsule Take 250 mg by mouth daily. contineous     cholecalciferol (VITAMIN D3) 25 MCG (1000 UNIT) tablet Take 3,000 Units by mouth daily.  famotidine (PEPCID) 20 MG tablet Take 20 mg by mouth 2 (two) times daily.     ferrous gluconate (FERGON) 324 MG tablet Take 324 mg by mouth every other day.     fluticasone (FLONASE) 50 MCG/ACT nasal spray Place 2 sprays into both nostrils 2 (two) times daily.  2   insulin aspart (NOVOLOG) 100 UNIT/ML injection  Inject 0-15 Units into the skin every 4 (four) hours. Sliding scale  CBG 70 - 120: 0 units: CBG 121 - 150: 2 units; CBG 151 - 200: 3 units; CBG 201 - 250: 5 units; CBG 251 - 300: 8 units;CBG 301 - 350: 11 units; CBG 351 - 400: 15 units; CBG > 400 : 15 units and notify MD (Patient taking differently: Inject 0-15 Units into the skin in the morning and at bedtime. Sliding scale  If CBG>70 Call NP/PA, CBG 70 - 120: 0 units: CBG 121 - 150: 2 units; CBG 151 - 200: 3 units; CBG 201 - 250: 5 units; CBG 251 - 300: 8 units; CBG 301 - 350: 11 units; CBG 351 - 400: 15units and  CBG >400: 15 Units, CBG>400 Call MD) 10 mL 11   insulin glargine (LANTUS) 100 unit/mL SOPN Inject 15 Units into the skin at bedtime. (Patient taking differently: Inject 38 Units into the skin at bedtime.) 15 mL 11   losartan (COZAAR) 50 MG tablet Take 50 mg by mouth daily.     Magnesium Oxide, Elemental, 400 MG TABS Take 400 mg by mouth 3 (three) times daily.     metFORMIN (GLUCOPHAGE-XR) 500 MG 24 hr tablet Take 1,000 mg by mouth in the morning and at bedtime.     nystatin powder Apply 1 Application topically daily.     OxyCODONE HCl, Abuse Deter, (OXAYDO) 5 MG TABA Take 5 mg by mouth every 8 (eight) hours as needed (pain).     Polyethyl Glycol-Propyl Glycol (GENTEAL TEARS SEVERE DAY/NIGHT) 0.4-0.3 % GEL ophthalmic gel Place 1 Application into both eyes in the morning, at noon, in the evening, and at bedtime. 2 drops qid     Pseudoeph-Bromphen-DM (ROBITUSSIN ALLERGY/COUGH PO) Take 10 mLs by mouth every 6 (six) hours as needed (cough).     PSYLLIUM PO Take 0.8 g by mouth in the morning and at bedtime.     riTUXimab (RITUXAN) 100 MG/10ML injection Inject 10 mg into the vein See admin instructions. Once between 12th -13th of June, December     senna-docusate (SENOKOT-S) 8.6-50 MG tablet Take 2 tablets by mouth daily.     sertraline (ZOLOFT) 25 MG tablet Take 25 mg by mouth daily.     traZODone (DESYREL) 50 MG tablet Take 75 mg by mouth at  bedtime.     vitamin B-12 (CYANOCOBALAMIN) 1000 MCG tablet Take 2,000 mcg by mouth daily.     vitamin C (ASCORBIC ACID) 500 MG tablet Take 500 mg by mouth daily.     Menthol, Topical Analgesic, (BIOFREEZE) 4 % GEL Apply 1 application  topically 2 (two) times daily. To back and shoulders (Patient not taking: Reported on 05/26/2022)     phenol (CHLORASEPTIC) 1.4 % LIQD Use as directed 2 sprays in the mouth or throat every 4 (four) hours as needed for throat irritation / pain. (Patient not taking: Reported on 05/26/2022)     No current facility-administered medications for this visit.    Allergies Allergies  Allergen Reactions   Gabapentin Other (See Comments)    Unknown   Levofloxacin Rash   Penicillins  Hives and Rash    Tolerated Unasyn, Augmentin, cefepime and cephalexin    Histories Past Medical History:  Diagnosis Date   Allergy    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 angioplasty of his RCA in 1990. b. bare metal stent placed in the RCA in 2000 followed by rotational atherectomy shortly after for stent restenosis. c. last cath was in 2012 showing stable moderate diffuse CAD. (70% mid LAD, 80% diagonal, 70% Ramus, 40% mid to distal RCA stent restenosis). d. Low risk nuc in 2015.   Diabetes mellitus    Diverticulosis    Elevated CK    Erectile dysfunction    Hemorrhoids    HTN (hypertension)    Hyperlipidemia    Hypertriglyceridemia    Malignant melanoma of left side of neck ([place]) 10/25/2018   Myocardial infarction ([place])    Obesity    OSA (obstructive sleep apnea)    Persistent disorder of initiating or maintaining sleep    Personal history of colonic polyps 02/05/2003   Past Surgical History:  Procedure Laterality Date   BIOPSY  03/26/2022   Procedure: BIOPSY;  Surgeon: Gatha Mayer, MD;  Location: [facility] ENDOSCOPY;  Service: Gastroenterology;;   CHOLECYSTECTOMY     CORONARY ANGIOPLASTY     CORONARY STENT PLACEMENT     stenting  of the right coronary artery with followup rotational  atherectomy. (3 stents placed)   ESOPHAGOGASTRODUODENOSCOPY (EGD) WITH PROPOFOL N/A 03/26/2022   Procedure: ESOPHAGOGASTRODUODENOSCOPY (EGD) WITH PROPOFOL;  Surgeon: Gatha Mayer, MD;  Location: [place];  Service: Gastroenterology;  Laterality: N/A;   FINGER SURGERY     right   FOOT SURGERY     right   INGUINAL HERNIA REPAIR     right   IR GASTROSTOMY TUBE REMOVAL  02/10/2022   IRRIGATION AND DEBRIDEMENT ABSCESS N/A 06/04/2020   Procedure: IRRIGATION AND DEBRIDEMENT SACRAL WOUND;  Surgeon: Jesusita Oka, MD;  Location: [place];  Service: General;  Laterality: N/A;   LAPAROSCOPY N/A 06/04/2020   Procedure: LAPAROSCOPY ASSISTED COLOSTOMY, LAPAROSCOPY GASTROSTOMY;  Surgeon: Jesusita Oka, MD;  Location: [place];  Service: General;  Laterality: N/A;   LYSIS OF ADHESION N/A 06/04/2020   Procedure: LYSIS OF ADHESION;  Surgeon: Jesusita Oka, MD;  Location: [place];  Service: General;  Laterality: N/A;   melanoma removal     neck   ORTHOPEDIC SURGERY     foot right   rotator cuff surg     [place]il   Social History   Socioeconomic History   Marital status: Married    Spouse name: Not on file   Number of children: 1   Years of education: Not on file   Highest education level: Not on file  Occupational History   Occupation: post-master    Comment: [place]  Tobacco Use   Smoking status: Former    Packs/day: 1.50    Years: 30.00    Total pack years: 45.00    Types: Cigarettes    Quit date: 09/19/1980    Years since quitting: 41.7   Smokeless tobacco: Never  Vaping Use   Vaping Use: Never used  Substance and Sexual Activity   Alcohol use: Not Currently    Alcohol/week: 7.0 - 14.0 standard drinks of alcohol    Types: 7 - 14 Shots of liquor per week   Drug use: No   Sexual activity: Not Currently    Partners: Female  Other Topics  Concern   Not on file  Social History Narrative   12/14/2018: Lives with wife and is caretaker  of grandson (son had passed away several years ago)   Recevies much of his care through [place]   Enjoys golfing   Social Determinants of Health   Financial Resource Strain: Low Risk  (03/06/2020)   Overall Financial Resource Strain (CARDIA)    Difficulty of Paying Living Expenses: Not hard at all  Food Insecurity: No Food Insecurity (12/14/2018)   Hunger Vital Sign    Worried About Running Out of Food in the Last Year: Never true    [place] in the Last Year: Never true  Transportation Needs: No Transportation Needs (03/06/2020)   PRAPARE - Hydrologist (Medical): No    Lack of Transportation (Non-Medical): No  Physical Activity: Inactive (12/14/2018)   Exercise Vital Sign    Days of Exercise per Week: 0 days    Minutes of Exercise per Session: 0 min  Stress: No Stress Concern Present (12/14/2018)   Pinch    Feeling of Stress : Not at all  Social Connections: Unknown (12/14/2018)   Social Connection and Isolation Panel [NHANES]    Frequency of Communication with Friends and Family: Once a week    Frequency of Social Gatherings with Friends and Family: Never    Attends Religious Services: Not on Advertising copywriter or Organizations: Not on file    Attends Archivist Meetings: Not on file    Marital Status: Married  Intimate Partner Violence: Not At Risk (12/14/2018)   Humiliation, Afraid, Rape, and Kick questionnaire    Fear of Current or Ex-Partner: No    Emotionally Abused: No    Physically Abused: No    Sexually Abused: No   Family History  Problem Relation Age of Onset   Lung cancer Mother    COPD Mother    Obesity Mother    Liver cancer Father    Lung cancer Father    Heart disease Father    Heart disease Sister    Colon cancer Neg Hx    Stomach cancer Neg Hx    Pancreatic cancer Neg Hx    Esophageal cancer Neg Hx    Inflammatory bowel disease Neg  Hx    Liver disease Neg Hx    Rectal cancer Neg Hx    I have reviewed his medical, social, and family history in detail and updated the electronic medical record as necessary.    PHYSICAL EXAMINATION  BP 130/74   Pulse 63   Ht 6' (1.829 m)   Wt 199 lb (90.3 kg)   SpO2 98%   BMI 26.99 kg/m  Wt Readings from Last 3 Encounters:  05/26/22 199 lb (90.3 kg)  03/26/22 190 lb 0.6 oz (86.2 kg)  11/01/21 190 lb (86.2 kg)  GEN: NAD, appears older than stated age, doesn't appear chronically ill however, in wheelchair PSYCH: Cooperative, without pressured speech EYE: Conjunctivae pink, sclerae anicteric ENT: MMM CV: Nontachycardic RESP: No audible wheezing GI: NABS, rounded, obese, left upper quadrant gastrocutaneous fistula noted (no output in bag), lower abdominal ostomy in place, nontender, no rebound or guarding  MSK/EXT: Bilateral lower extremity edema present SKIN: No jaundice NEURO:  Alert & Oriented x 3   REVIEW OF DATA  I reviewed the following data at the time of this encounter:  GI Procedures and Studies  July 2023 EGD - Gastrostomy tract present and patent - A single gastric polyp. Biopsied. - A single gastric polyp. Biopsied. - Erythematous mucosa in the stomach. - A small amount of food (residue) in the stomach. - Normal examined duodenum. - The examination was otherwise normal. NO GASTRIC OUTLET OBSTRUCTION - I THINK THE PROBLEM IS THAT THE GASTROSTOMY TRACT WILL NOT HEAL. NO TUBULAR STRUCTURE AS SUSPECTED ON CT  Pathology A.   STOMACH, ANTRUM, POLYPECTOMY:  Moderate chronic active gastritis with a focus suggestive of small  hyperplastic polyp.  No dysplasia or malignancy is seen.  Alcian blue stain with appropriate controls does not highlight any  intestinal metaplasia.  Immunohistochemistry with appropriate controls for single antibody for  H. pylori is negative for H. pylori-like organisms.  B.   STOMACH, BODY, POLYPECTOMY:  Well-differentiated  neuroendocrine tumor, G1.  See comment.   Comment:  As per departmental protocol the malignancy was confirmed by Dr. Thressa Sheller who concurs with the findings.  Immunohistochemistry with appropriate controls for single antibody for  chromogranin, synaptophysin and CD56 is positive in the tumor cells and  Ki-67 index is less than 3%.  Immunohistochemistry for H. pylori is  negative for H. pylori-like organisms.  Alcian blue special stain with  appropriate controls highlights the intestinal metaplasia.  Clinical and  endoscopic correlation is required  Laboratory Studies  Reviewed those in epic  Imaging Studies  July 2023 CT abdomen pelvis with contrast IMPRESSION: 1. No evidence of bowel obstruction or free air. 2. Percutaneous gastrostomy site without evidence of PEG tube. An air-fluid tubular structure is noted in the gastric body. Correlate clinically to exclude residual foreign body. 3. Diverticulosis without diverticulitis. 4. Decubitus ulcer over the ischium on the right with erosive bony changes, not significantly changed from the prior exam, possible chronic osteomyelitis. 5. Stable renal cysts. 6. Aortic atherosclerosis and coronary artery calcifications. 7. Remaining incidental findings as described above.  November 2022 CT abdomen pelvis IMPRESSION: 1. Foley catheter with balloon in the region of the prostatic urethra. Recommend further advancing of the catheter by additional 7 cm. 2. Severe colonic diverticulosis. No bowel obstruction. Normal appendix. 3. Fatty liver. 4. Trace bilateral pleural effusions with associated partial compressive atelectasis of the lower lobes. 5. Aortic Atherosclerosis (ICD10-I70.0).  April 2022 CT renal stone protocol IMPRESSION: No ureteral stones or hydronephrosis. Foley catheter is within the bladder which is decompressed. Left lower quadrant ostomy noted.  No evidence of bowel obstruction. Colonic diverticulosis. Aortoiliac  atherosclerosis.   ASSESSMENT  Brent [name] is a 76 y.o. 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  The patient is seen today for evaluation and management of:  1. Gastrocutaneous fistula   2. Primary malignant neuroendocrine tumor of stomach ([place])   3. Preoperative examination   4. Abnormal endoscopy of upper gastrointestinal tract    The patient is hemodynamically stable.  Patient has multiple issues at play from a GI standpoint.  His biggest issue is his gastrocutaneous fistula that has been a chronic issue for him for a while now.  He is a nonsurgical candidate due to his underlying disease process and mobility issues with concern for wound healing.  He has failed interventional radiology attempt at closure.  I think that there is a chance we could perform OVESCO closure with APC degranulation of the chronic fistulous tract tissue with  an attempt of trying to close this endoscopically.  These chronic fistula are more difficult to close endoscopically but gives [place] an opportunity to at least try.  This is a higher than normal risk procedure and I am the only one who can perform this in [place] outside of the academic centers.  I am willing to perform an attempt at this.  I am in the process of also obtaining our OVESCO reMOVE system should the clip closure attempt not be successful or if we need to remove it for some reason in the future.  In regards to the patient's gastric polyps with biopsy that suggested gastric neuroendocrine tumor, we need to further evaluate the stomach and potentially resect a few of these polyps.  Based upon the description and endoscopic pictures I do feel that it is reasonable to pursue an Advanced Polypectomy attempt of the polyp/lesion.  We discussed  some of the techniques of advanced polypectomy which include Endoscopic Mucosal Resection, OVESCO Full-Thickness Resection, Endorotor Morcellation, and Tissue Ablation via Fulguration.  We also reviewed images of typical techniques as noted above.  The risks and benefits of endoscopic evaluation were discussed with the patient; these include but are not limited to the risk of perforation, infection, bleeding, missed lesions, lack of diagnosis, severe illness requiring hospitalization, as well as anesthesia and sedation related illnesses.  During attempts at advanced resection, the risks of bleeding and perforation/leak are increased as opposed to diagnostic and screening procedures, and that was discussed with the patient as well.   In addition, I explained that with the possible need for piecemeal resection, subsequent short-interval endoscopic evaluation for follow up and potential retreatment of the lesion/area may be necessary.  If, after attempt at removal of the polyp/lesion, it is found that the patient has a complication or that an invasive lesion or significant malignant lesion is found, or that the polyp/lesion continues to recur, the patient is aware and understands that surgery may still be indicated/required.  I will perform an endoscopic ultrasound as well to evaluate the stomach lining of these lesions sure that they are not deep.  I will also perform gastric mapping biopsies as I suspect that this patient has more of an atrophic gastritis that is probably led to his gastric neuroendocrine tumors.  The risks of an EUS including intestinal perforation, bleeding, infection, aspiration, and medication effects were discussed as was the possibility it may not give a definitive diagnosis if a biopsy is performed.  Due to the overall complexity of this patient as well as the amount of procedures that we are planning, will need at least 2 hours of endoscopic time to feel comfortable with trying to do all of  these things together.  Time will tell if we can be of assistance but hopefully we can.  All other GI issues can be further discussed or reviewed with his primary gastroenterologist.  I will forward a notation to him as well as to our surgery colleagues.  All patient questions were answered, to the best of my ability, and the patient agrees to the aforementioned plan of action with follow-up as indicated.   PLAN  Preprocedure labs to be drawn as outlined below Proceed with scheduling EGD for attempted endoscopic closure of chronic gastrocutaneous fistula with OVESCO Proceed with scheduling EUS for evaluation of gastric mucosa prior to attempt at removal of gastric polyps that have returned as gastric neuroendocrine tumors Further follow-up based on results of procedure   Orders Placed This Encounter  Procedures   Procedural/ Surgical Case Request: ESOPHAGOGASTRODUODENOSCOPY (EGD) WITH PROPOFOL, UPPER ESOPHAGEAL ENDOSCOPIC ULTRASOUND (EUS)   CBC   Basic metabolic panel   INR/PT   Chromogranin A   Ambulatory referral to Gastroenterology    New Prescriptions   No medications on file   Modified Medications   No medications on file    Planned Follow Up No follow-ups on file.   Total Time in Face-to-Face and in Coordination of Care for patient including independent/personal interpretation/review of prior testing, medical history, examination, medication adjustment, communicating results with the patient directly, and documentation within the EHR is 45 minutes.   Justice Britain, MD [facility] Advanced Endoscopy Office # [phone]

## 2022-05-31 ENCOUNTER — Other Ambulatory Visit: Payer: Self-pay

## 2022-05-31 DIAGNOSIS — D509 Iron deficiency anemia, unspecified: Secondary | ICD-10-CM

## 2022-06-07 ENCOUNTER — Telehealth: Payer: Self-pay

## 2022-06-07 NOTE — Telephone Encounter (Signed)
Spoke with Janett Billow and she is aware of Dr. Donneta Romberg recommendations.

## 2022-06-07 NOTE — Telephone Encounter (Signed)
Pt at [facility]. His G tube site is still not healing. When he takes oral medications they are coming out of the old G tube site. The MD at [facility] is wanting pt to be seen to see what can be done. Dr. Rush Landmark I see where you saw the pt 05/26/22. It looks like pt is scheduled for a procedure in October. The facility MD is requesting a second opinion. Please advise. Spoke with Nurse Janett Billow at [facility] [phone].

## 2022-06-07 NOTE — Telephone Encounter (Signed)
Vaughan Basta, I am sorry to hear this. He has had this issue for many months so I am not sure what has significantly changed. However I am the only provider in [place] who can offer this type of procedure attempt at closure. I do not have anything else that is available sooner unfortunately. If the patient and provider want another opinion they can refer the patient to one of the quaternary centers. Thanks. GM

## 2022-06-07 NOTE — Telephone Encounter (Signed)
Note also faxed to [place] at [phone].

## 2022-07-04 ENCOUNTER — Encounter (HOSPITAL_COMMUNITY): Payer: Self-pay | Admitting: Gastroenterology

## 2022-07-05 NOTE — Progress Notes (Signed)
Preop instructions for:  Brent [name].     Date of Birth:    28-Apr-1946                   Date of Procedure:   07/11/22 Procedure:   EUS , Upper Endoscopy  Surgeon:  [place] contact:  [place] and Fax #: [phone] RN contact name/phone#:  same as main line              Transportation contact phone#: From facility  Time to arrive at [facility]: 1115 am    Report to: Admitting    *Do not eat solid food past midnight the night before your procedure.(To include any tube feedings-must be discontinued) May have the following until:    0845 am    AM/PM day of procedure  CLEAR LIQUID DIET Water Black Coffee (sugar ok, NO MILK/CREAM OR CREAMERS)  Tea (sugar ok, NO MILK/CREAM OR CREAMERS) regular and decaf                             Plain Jell-O (NO RED)                                           Fruit ices (not with fruit pulp, NO RED)                                     Popsicles (NO RED)                                                                  Juice: apple, WHITE grape, WHITE cranberry Sports drinks like Gatorade (NO RED)   Take these morning medications only with sips of water day of procedure.(or give through gastrostomy or feeding tube).     Aspirin, Zoloft, Keflex, Pepcid, Lantus (  dose night before procedure)    Note: No Insulin or Diabetic meds should be given or taken the morning of the procedure!   Please send day of procedure: -current med list and meds last taken that day - confirm nothing by mouth status from what time, -Patient Demographic info( to include DNR status, problem list, allergies)   Bring Insurance card and picture ID Leave all jewelry and other valuables at place where living( no metal or rings to be worn) No contact lens Women-no make-up, no lotions,perfumes,powders Men-no colognes,lotions   Any questions prior to procedure call Morey Hummingbird,  pre surg testing [phone] Any  questions DAY OF procedure call  ENDO [phone]  Sent from: [facility] [place]                    [phone] Sent by :   Morey Hummingbird, RN

## 2022-07-11 ENCOUNTER — Encounter (HOSPITAL_COMMUNITY): Payer: Self-pay | Admitting: Gastroenterology

## 2022-07-11 ENCOUNTER — Ambulatory Visit (HOSPITAL_BASED_OUTPATIENT_CLINIC_OR_DEPARTMENT_OTHER): Payer: Medicare Other | Admitting: Anesthesiology

## 2022-07-11 ENCOUNTER — Telehealth: Payer: Self-pay

## 2022-07-11 ENCOUNTER — Encounter (HOSPITAL_COMMUNITY)
Admission: RE | Disposition: A | Discharge status: Home / Self Care | Payer: Self-pay | Source: Ambulatory Visit | Attending: Gastroenterology

## 2022-07-11 ENCOUNTER — Ambulatory Visit (HOSPITAL_COMMUNITY): Payer: Medicare Other | Admitting: Anesthesiology

## 2022-07-11 ENCOUNTER — Other Ambulatory Visit: Payer: Self-pay

## 2022-07-11 ENCOUNTER — Ambulatory Visit (HOSPITAL_COMMUNITY)
Admission: RE | Admit: 2022-07-11 | Discharge: 2022-07-11 | Disposition: A | Discharge status: Home / Self Care | Payer: Medicare Other | Source: Ambulatory Visit | Attending: Gastroenterology | Admitting: Gastroenterology

## 2022-07-11 DIAGNOSIS — T18128A Food in esophagus causing other injury, initial encounter: Secondary | ICD-10-CM

## 2022-07-11 DIAGNOSIS — K255 Chronic or unspecified gastric ulcer with perforation: Secondary | ICD-10-CM | POA: Insufficient documentation

## 2022-07-11 DIAGNOSIS — Z955 Presence of coronary angioplasty implant and graft: Secondary | ICD-10-CM | POA: Insufficient documentation

## 2022-07-11 DIAGNOSIS — M5481 Occipital neuralgia: Secondary | ICD-10-CM | POA: Diagnosis not present

## 2022-07-11 DIAGNOSIS — F32A Depression, unspecified: Secondary | ICD-10-CM | POA: Diagnosis not present

## 2022-07-11 DIAGNOSIS — G8220 Paraplegia, unspecified: Secondary | ICD-10-CM | POA: Insufficient documentation

## 2022-07-11 DIAGNOSIS — K316 Fistula of stomach and duodenum: Secondary | ICD-10-CM

## 2022-07-11 DIAGNOSIS — Z7984 Long term (current) use of oral hypoglycemic drugs: Secondary | ICD-10-CM | POA: Insufficient documentation

## 2022-07-11 DIAGNOSIS — G360 Neuromyelitis optica [Devic]: Secondary | ICD-10-CM | POA: Insufficient documentation

## 2022-07-11 DIAGNOSIS — G4730 Sleep apnea, unspecified: Secondary | ICD-10-CM | POA: Insufficient documentation

## 2022-07-11 DIAGNOSIS — Z87891 Personal history of nicotine dependence: Secondary | ICD-10-CM

## 2022-07-11 DIAGNOSIS — I2510 Atherosclerotic heart disease of native coronary artery without angina pectoris: Secondary | ICD-10-CM

## 2022-07-11 DIAGNOSIS — T182XXA Foreign body in stomach, initial encounter: Secondary | ICD-10-CM | POA: Insufficient documentation

## 2022-07-11 DIAGNOSIS — K2289 Other specified disease of esophagus: Secondary | ICD-10-CM | POA: Insufficient documentation

## 2022-07-11 DIAGNOSIS — D3A8 Other benign neuroendocrine tumors: Secondary | ICD-10-CM | POA: Insufficient documentation

## 2022-07-11 DIAGNOSIS — I10 Essential (primary) hypertension: Secondary | ICD-10-CM | POA: Diagnosis not present

## 2022-07-11 DIAGNOSIS — T183XXA Foreign body in small intestine, initial encounter: Secondary | ICD-10-CM | POA: Diagnosis not present

## 2022-07-11 DIAGNOSIS — I252 Old myocardial infarction: Secondary | ICD-10-CM | POA: Insufficient documentation

## 2022-07-11 DIAGNOSIS — R948 Abnormal results of function studies of other organs and systems: Secondary | ICD-10-CM | POA: Diagnosis present

## 2022-07-11 DIAGNOSIS — Z79899 Other long term (current) drug therapy: Secondary | ICD-10-CM | POA: Diagnosis not present

## 2022-07-11 DIAGNOSIS — R933 Abnormal findings on diagnostic imaging of other parts of digestive tract: Secondary | ICD-10-CM

## 2022-07-11 DIAGNOSIS — Z794 Long term (current) use of insulin: Secondary | ICD-10-CM | POA: Insufficient documentation

## 2022-07-11 DIAGNOSIS — Z931 Gastrostomy status: Secondary | ICD-10-CM | POA: Diagnosis not present

## 2022-07-11 DIAGNOSIS — E119 Type 2 diabetes mellitus without complications: Secondary | ICD-10-CM | POA: Insufficient documentation

## 2022-07-11 DIAGNOSIS — K449 Diaphragmatic hernia without obstruction or gangrene: Secondary | ICD-10-CM | POA: Diagnosis not present

## 2022-07-11 HISTORY — PX: HEMOSTASIS CLIP PLACEMENT: SHX6857

## 2022-07-11 HISTORY — PX: ESOPHAGOGASTRODUODENOSCOPY (EGD) WITH PROPOFOL: SHX5813

## 2022-07-11 HISTORY — PX: HOT HEMOSTASIS: SHX5433

## 2022-07-11 LAB — GLUCOSE, CAPILLARY: Glucose-Capillary: 241 mg/dL — ABNORMAL HIGH (ref 70–99)

## 2022-07-11 SURGERY — ESOPHAGOGASTRODUODENOSCOPY (EGD) WITH PROPOFOL
Anesthesia: General

## 2022-07-11 MED ORDER — AMOXICILLIN-POT CLAVULANATE 875-125 MG PO TABS: 1.0000 | ORAL_TABLET | Freq: Two times a day (BID) | ORAL | 0 refills | Status: AC

## 2022-07-11 MED ORDER — SODIUM CHLORIDE 0.9 % IV SOLN
1.5000 g | INTRAVENOUS | Status: AC
  Administered 2022-07-11: 1.5 g via INTRAVENOUS
  Filled 2022-07-11: qty 4

## 2022-07-11 MED ORDER — SODIUM CHLORIDE 0.9 % IV SOLN: INTRAVENOUS | Status: DC

## 2022-07-11 MED ORDER — ROCURONIUM BROMIDE 100 MG/10ML IV SOLN
INTRAVENOUS | Status: DC | PRN
  Administered 2022-07-11: 90 mg via INTRAVENOUS

## 2022-07-11 MED ORDER — LIDOCAINE HCL (CARDIAC) PF 100 MG/5ML IV SOSY
PREFILLED_SYRINGE | INTRAVENOUS | Status: DC | PRN
  Administered 2022-07-11: 100 mg via INTRAVENOUS

## 2022-07-11 MED ORDER — ONDANSETRON HCL 4 MG/2ML IJ SOLN
INTRAMUSCULAR | Status: DC | PRN
  Administered 2022-07-11: 4 mg via INTRAVENOUS

## 2022-07-11 MED ORDER — CIPROFLOXACIN IN D5W 400 MG/200ML IV SOLN
INTRAVENOUS | Status: AC
  Filled 2022-07-11: qty 200

## 2022-07-11 MED ORDER — OMEPRAZOLE 40 MG PO CPDR: 40.0000 mg | DELAYED_RELEASE_CAPSULE | Freq: Every day | ORAL | 12 refills | Status: DC

## 2022-07-11 MED ORDER — PROPOFOL 10 MG/ML IV BOLUS
INTRAVENOUS | Status: DC | PRN
  Administered 2022-07-11: 150 mg via INTRAVENOUS

## 2022-07-11 MED ORDER — SUGAMMADEX SODIUM 200 MG/2ML IV SOLN
INTRAVENOUS | Status: DC | PRN
  Administered 2022-07-11: 300 mg via INTRAVENOUS

## 2022-07-11 MED ORDER — LACTATED RINGERS IV SOLN: INTRAVENOUS | Status: DC | PRN

## 2022-07-11 MED ORDER — LACTATED RINGERS IV SOLN: INTRAVENOUS | Status: DC

## 2022-07-11 SURGICAL SUPPLY — 15 items

## 2022-07-11 NOTE — Op Note (Addendum)
[facility] Patient Name: Brent [name] Procedure Date: 07/11/2022 MRN: [id number] Attending MD: Justice Britain , MD, [id number] Date of Birth: 09/12/1946 CSN: [id number] Age: 76 Admit Type: Outpatient Procedure:                Upper GI endoscopy intention was for upper EUS but                            had to convert Indications:              Diagnostic procedure, Abnormal CT of the GI tract,                            For endoscopic therapy of gastric perforation,                            Neuroendocrine Tumor Providers:                Justice Britain, MD, Jeanella Cara, RN,                            Cletis Athens, Technician Referring MD:             Jesusita Oka, [place] Loletha Carrow, MD, Gatha Mayer, MD Medicines:                General Anesthesia, Unasyn 1.5 g IV Complications:            No immediate complications. Estimated Blood Loss:     Estimated blood loss was minimal. Procedure:                After obtaining informed consent, the endoscope was                            passed under direct vision. Throughout the                            procedure, the patient's blood pressure, pulse, and                            oxygen saturations were monitored continuously. The                            [id number] ([id number]) Olympus therapeutic endoscope                            was introduced through the mouth, and advanced to                            the second part of duodenum. After obtaining                            informed consent, the endoscope was passed under  direct vision. Throughout the procedure, the                            patient's blood pressure, pulse, and oxygen                            saturations were monitored continuously. Scope In: Scope Out: Findings:      No gross lesions were noted in the entire esophagus.      The Z-line was irregular and was found 41  cm from the incisors.      A 1 cm hiatal hernia was present.      A large amount of food (residue) was found in the entire examined       stomach. Suction via Endoscope was performed with mild success. Further       removal of food to aid in further evaluation was accomplished.      An 8 mm fistula was found on the anterior wall of the stomach. I could       visualize my glove endoscopically through the fistula tract. To improve       the chance of healing, coagulation for tissue destruction using pulsed       argon plasma at 0.8 liters/minute and 30 watts was successful to       stimulate granulation tissue was performed. I initially trialed the       OVESCO anchor to see if I could grasp further tissue and bring that into       the cap, however the fistula site is too large for that. I subsequently       used the suction technique in an attempt of repairing this fistula.       Using significant suction for 30 seconds, it was felt that tissue edges       were approximated and one 12/6 GC OVESCO over-the-scope clip was       successfully placed (MR conditional). Closure of the defect was felt to       be successful. I tried to insufflate the stomach further and had good       success without noticing his fistula drainage bag having any gas/CO2       enter. There was no bleeding at the end of the procedure from the site.      Food (residue) was found in the duodenal bulb, in the first portion of       the duodenum and in the second portion of the duodenum.      Unfortunately due to the significant mount of foodstuffs present,       attempt at EUS and EMR of the gastric neuroendocrine tumor at the same       time with today's procedure was not able to be performed. Impression:               - No gross lesions in esophagus. Z-line irregular,                            41 cm from the incisors.                           - 1 cm hiatal hernia.                           -  A large amount of food  (residue) in the stomach.                            Removal was partially successful.                           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 APC to degranulated tissue                            with subsequent OVESCO 12/6 GC OTS see placed in                            attempt of closure of the fistula.                           - Retained food in the duodenum.                           - As result of the foodstuffs present, EUS with EMR                            of gastric neuroendocrine tumor not able to be                            performed today. Moderate Sedation:      Not Applicable - Patient had care per Anesthesia. Recommendation:           - The patient will be observed post-procedure,                            until all discharge criteria are met.                           - Discharge patient to home.                           - Patient has a contact number available for                            emergencies. The signs and symptoms of potential                            delayed complications were discussed with the                            patient. Return to normal activities tomorrow.                            Written discharge instructions were provided to the  patient.                           - Full liquid diet for 36 hours in an effort of                            trying to minimize risk of clip migration too soon.                            If does well then can return to regular diet                            thereafter.                           - Augmentin twice daily x3 days (although he has a                            penicillin allergy noted in the system it has noted                            that he has tolerated Unasyn and Augmentin in the                            past).                           - Initiate omeprazole 40 mg daily.                            - Observe patient's clinical course.                           - Monitor overall output from gastrocutaneous                            fistula site over the next few weeks. Hopefully                            this will be the end of his issues.                           - Performed KUB in 2 to 3 weeks.                           - Follow-up in clinic in 6 weeks. This can be with                            Dr. Loletha Carrow or myself. With the hope that this has                            been a permanent solution to his fistula.                           -  Plan to repeat attempt at EGD with EUS and EMR in                            the next 2 to 4 months for the gastric                            neuroendocrine tumor attempt at resection.                           - The findings and recommendations were discussed                            with the patient.                           - The findings and recommendations were discussed                            with the patient's family. Procedure Code(s):        --- Professional ---                           [phone], Esophagogastroduodenoscopy, flexible,                            transoral; with ablation of tumor(s), polyp(s), or                            other lesion(s) (includes pre- and post-dilation                            and guide wire passage, when performed)                           [id number], Esophagogastroduodenoscopy, flexible,                            transoral; with removal of foreign body(s) Diagnosis Code(s):        --- Professional ---                           K22.8, Other specified diseases of esophagus                           K44.9, Diaphragmatic hernia without obstruction or                            gangrene                           T18.2XXA, Foreign body in stomach, initial encounter                           K31.6, Fistula of stomach and duodenum  T18.3XXA, Foreign body in small intestine,  initial                            encounter                           K31.89, Other diseases of stomach and duodenum                           R93.3, Abnormal findings on diagnostic imaging of                            other parts of digestive tract CPT copyright 2022 American Medical Association. All rights reserved. The codes documented in this report are preliminary and upon coder review may  be revised to meet current compliance requirements. Justice Britain, MD 07/11/2022 3:04:16 PM Number of Addenda: 0

## 2022-07-11 NOTE — Discharge Instructions (Signed)
YOU HAD AN ENDOSCOPIC PROCEDURE TODAY: Refer to the procedure report and other information in the discharge instructions given to you for any specific questions about what was found during the examination. If this information does not answer your questions, please call  office at [phone] to clarify.  ° °YOU SHOULD EXPECT: Some feelings of bloating in the abdomen. Passage of more gas than usual. Walking can help get rid of the air that was put into your GI tract during the procedure and reduce the bloating. If you had a lower endoscopy (such as a colonoscopy or flexible sigmoidoscopy) you may notice spotting of blood in your stool or on the toilet paper. Some abdominal soreness may be present for a day or two, also. ° °DIET: Your first meal following the procedure should be a light meal and then it is ok to progress to your normal diet. A half-sandwich or bowl of soup is an example of a good first meal. Heavy or fried foods are harder to digest and may make you feel nauseous or bloated. Drink plenty of fluids but you should avoid alcoholic beverages for 24 hours. If you had a esophageal dilation, please see attached instructions for diet.   ° °ACTIVITY: Your care partner should take you home directly after the procedure. You should plan to take it easy, moving slowly for the rest of the day. You can resume normal activity the day after the procedure however YOU SHOULD NOT DRIVE, use power tools, machinery or perform tasks that involve climbing or major physical exertion for 24 hours (because of the sedation medicines used during the test).  ° °SYMPTOMS TO REPORT IMMEDIATELY: °A gastroenterologist can be reached at any hour. Please call [phone]  for any of the following symptoms:  °Following lower endoscopy (colonoscopy, flexible sigmoidoscopy) °Excessive amounts of blood in the stool  °Significant tenderness, worsening of abdominal pains  °Swelling of the abdomen that is new, acute  °Fever of 100° or  higher  °Following upper endoscopy (EGD, EUS, ERCP, esophageal dilation) °Vomiting of blood or coffee ground material  °New, significant abdominal pain  °New, significant chest pain or pain under the shoulder blades  °Painful or persistently difficult swallowing  °New shortness of breath  °Black, tarry-looking or red, bloody stools ° °FOLLOW UP:  °If any biopsies were taken you will be contacted by phone or by letter within the next 1-3 weeks. Call [phone]  if you have not heard about the biopsies in 3 weeks.  °Please also call with any specific questions about appointments or follow up tests. ° °

## 2022-07-11 NOTE — Transfer of Care (Signed)
Immediate Anesthesia Transfer of Care Note  Patient: Brent [name].  Procedure(s) Performed: ESOPHAGOGASTRODUODENOSCOPY (EGD) WITH PROPOFOL HEMOSTASIS CLIP PLACEMENT HOT HEMOSTASIS (ARGON PLASMA COAGULATION/BICAP)  Patient Location: PACU and Endoscopy Unit  Anesthetic: General  Level of Consciousness: awake, alert  and oriented  Airway & Oxygen Therapy: Patient Spontanous Breathing and Patient connected to face mask oxygen  Post-op Assessment: Report given to RN and Post -op Vital signs reviewed and stable  Post vital signs: Reviewed and stable  Last Vitals:  Vitals Value Taken Time  BP 153/49 07/11/22 1458  Temp    Pulse 65 07/11/22 1500  Resp 12 07/11/22 1500  SpO2 98 % 07/11/22 1500  Vitals shown include unvalidated device data.  Last Pain:  Vitals:   07/11/22 1145  TempSrc: Oral  PainSc: 0-No pain         Complications: No notable events documented.

## 2022-07-11 NOTE — Anesthesia Procedure Notes (Addendum)
Procedure Name: Intubation Date/Time: 07/11/2022 1:59 PM  Performed by: [place] Manus Rudd, CRNAPre-anesthesia Checklist: Patient identified, Emergency Drugs available, Suction available and Patient being monitored Patient Re-evaluated:Patient Re-evaluated prior to induction Oxygen Delivery Method: Circle system utilized Preoxygenation: Pre-oxygenation with 100% oxygen Induction Type: IV induction Ventilation: Mask ventilation without difficulty Laryngoscope Size: Mac and 4 Grade View: Grade II Tube type: Oral Tube size: 7.5 mm Number of attempts: 1 Airway Equipment and Method: Stylet and Oral airway Placement Confirmation: ETT inserted through vocal cords under direct vision, positive ETCO2 and breath sounds checked- equal and bilateral Secured at: 21 cm Tube secured with: Tape Dental Injury: Teeth and Oropharynx as per pre-operative assessment

## 2022-07-11 NOTE — Telephone Encounter (Signed)
-----   Message from Irving Copas., MD sent at 07/11/2022  3:48 PM EDT ----- Regarding: Follow up Brent [name],  Please have this patient come in for a KUB in 2 to 3 weeks to evaluate the OVESCO clip report for his gastrocutaneous fistula. He will need a repeat EGD/EUS with EMR time slot with me in 2 to 3 months to evaluate the previously noted gastric neuroendocrine tumor that we could not remove today due to his significant mount of foodstuffs. He will need to be on clear liquids the day before his rescheduled visit. Please place a recall in the system for this. Follow-up in clinic with HD or myself in 4 to 6 weeks. Thanks. GM   FYI HD and CG and AL.  AL, Any thoughts that you would have for [place] to help with further closure of this gastrocutaneous fistula.  I feel pretty confident that we have been technically successful with the placement of this but we will have to see in the next couple of weeks if it remains closed would you ever see him or should we put in a referral to wound consult to see about putting a wicking device or as you all sometimes do in regards to packing incisions to help them heal by secondary intention.  Appreciate any thoughts so that we can hopefully have this gastrocutaneous fistula be a part of his past.   Thanks to all. GM

## 2022-07-11 NOTE — H&P (Signed)
GASTROENTEROLOGY PROCEDURE H&P NOTE   Primary Care Physician: Raymondo Band, MD  HPI: Brent [name]. is a 76 y.o. male who presents for EGD with EUS with possible EMR gastric neuroendocrine tumors as well as gastrocutaneous fistula for attempted endoscopic closure.  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 angioplasty of his RCA in 1990. b. bare metal stent placed in the RCA in 2000 followed by rotational atherectomy shortly after for stent restenosis. c. last cath was in 2012 showing stable moderate diffuse CAD. (70% mid LAD, 80% diagonal, 70% Ramus, 40% mid to distal RCA stent restenosis). d. Low risk nuc in 2015.   Diabetes mellitus    Diverticulosis    Elevated CK    Erectile dysfunction    Hemorrhoids    HTN (hypertension)    Hyperlipidemia    Hypertriglyceridemia    Malignant melanoma of left side of neck ([place]) 10/25/2018   Myocardial infarction ([place])    Obesity    OSA (obstructive sleep apnea)    Persistent disorder of initiating or maintaining sleep    Personal history of colonic polyps 02/05/2003   Past Surgical History:  Procedure Laterality Date   BIOPSY  03/26/2022   Procedure: BIOPSY;  Surgeon: Gatha Mayer, MD;  Location: [facility] ENDOSCOPY;  Service: Gastroenterology;;   CHOLECYSTECTOMY     CORONARY ANGIOPLASTY     CORONARY STENT PLACEMENT     stenting of the right coronary artery with followup rotational  atherectomy. (3 stents placed)   ESOPHAGOGASTRODUODENOSCOPY (EGD) WITH PROPOFOL N/A 03/26/2022   Procedure: ESOPHAGOGASTRODUODENOSCOPY (EGD) WITH PROPOFOL;  Surgeon: Gatha Mayer, MD;  Location: [place];  Service: Gastroenterology;  Laterality: N/A;   FINGER SURGERY     right   FOOT SURGERY     right   INGUINAL HERNIA REPAIR     right   IR GASTROSTOMY TUBE REMOVAL  02/10/2022   IRRIGATION AND DEBRIDEMENT ABSCESS N/A 06/04/2020   Procedure:  IRRIGATION AND DEBRIDEMENT SACRAL WOUND;  Surgeon: Jesusita Oka, MD;  Location: [place];  Service: General;  Laterality: N/A;   LAPAROSCOPY N/A 06/04/2020   Procedure: LAPAROSCOPY ASSISTED COLOSTOMY, LAPAROSCOPY GASTROSTOMY;  Surgeon: Jesusita Oka, MD;  Location: [place];  Service: General;  Laterality: N/A;   LYSIS OF ADHESION N/A 06/04/2020   Procedure: LYSIS OF ADHESION;  Surgeon: Jesusita Oka, MD;  Location: [facility] OR;  Service: General;  Laterality: N/A;   melanoma removal     neck   ORTHOPEDIC SURGERY     foot right   rotator cuff surg     Bil   Current Facility-Administered Medications  Medication Dose Route Frequency Provider Last Rate Last Admin   0.9 %  sodium chloride infusion   Intravenous Continuous Mansouraty, Telford Nab., MD       lactated ringers infusion   Intravenous Continuous Mansouraty, Telford Nab., MD        Current Facility-Administered Medications:    0.9 %  sodium chloride infusion, , Intravenous, Continuous, Mansouraty, Telford Nab., MD   lactated ringers infusion, , Intravenous, Continuous, Mansouraty, Telford Nab., MD Allergies  Allergen Reactions   Gabapentin Other (See Comments)    Unknown   Levofloxacin Rash   Penicillins Hives and Rash    Tolerated Unasyn, Augmentin, cefepime and cephalexin   Family History  Problem Relation Age of Onset   Lung cancer Mother  COPD Mother    Obesity Mother    Liver cancer Father    Lung cancer Father    Heart disease Father    Heart disease Sister    Colon cancer Neg Hx    Stomach cancer Neg Hx    Pancreatic cancer Neg Hx    Esophageal cancer Neg Hx    Inflammatory bowel disease Neg Hx    Liver disease Neg Hx    Rectal cancer Neg Hx    Social History   Socioeconomic History   Marital status: Married    Spouse name: Not on file   Number of children: 1   Years of education: Not on file   Highest education level: Not on file  Occupational History   Occupation: post-master    Comment: [place]   Tobacco Use   Smoking status: Former    Packs/day: 1.50    Years: 30.00    Total pack years: 45.00    Types: Cigarettes    Quit date: 09/19/1980    Years since quitting: 41.8   Smokeless tobacco: Never  Vaping Use   Vaping Use: Never used  Substance and Sexual Activity   Alcohol use: Not Currently    Alcohol/week: 7.0 - 14.0 standard drinks of alcohol    Types: 7 - 14 Shots of liquor per week   Drug use: No   Sexual activity: Not Currently    Partners: Female  Other Topics Concern   Not on file  Social History Narrative   12/14/2018: Lives with wife and 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 (son had passed away several years ago)   Recevies much of his care through [place]   Enjoys golfing   Social Determinants of Health   Financial Resource Strain: Low Risk  (03/06/2020)   Overall Financial Resource Strain (CARDIA)    Difficulty of Paying Living Expenses: Not hard at all  Food Insecurity: No Food Insecurity (12/14/2018)   Hunger Vital Sign    Worried About Running Out of Food in the Last Year: Never true    Ran Out of Food in the Last Year: Never true  Transportation Needs: No Transportation Needs (03/06/2020)   PRAPARE - Hydrologist (Medical): No    Lack of Transportation (Non-Medical): No  Physical Activity: Inactive (12/14/2018)   Exercise Vital Sign    Days of Exercise per Week: 0 days    Minutes of Exercise per Session: 0 min  Stress: No Stress Concern Present (12/14/2018)   [place]    Feeling of Stress : Not at all  Social Connections: Unknown (12/14/2018)   Social Connection and Isolation Panel [NHANES]    Frequency of Communication with Friends and Family: Once a week    Frequency of Social Gatherings with Friends and Family: Never    Attends Religious Services: Not on Advertising copywriter or Organizations: Not on file    Attends Archivist Meetings: Not on file     Marital Status: Married  Intimate Partner Violence: Not At Risk (12/14/2018)   Humiliation, Afraid, Rape, and Kick questionnaire    Fear of Current or Ex-Partner: No    Emotionally Abused: No    Physically Abused: No    Sexually Abused: No    Physical Exam: There were no vitals filed for this visit. There is no height or weight on file to calculate BMI. GEN: NAD EYE: Sclerae anicteric ENT:  MMM CV: Non-tachycardic GI: Soft, NT/ND NEURO:  Alert & Oriented x 3  Lab Results: No results for input(s): "WBC", "HGB", "HCT", "PLT" in the last 72 hours. BMET No results for input(s): "NA", "K", "CL", "CO2", "GLUCOSE", "BUN", "CREATININE", "CALCIUM" in the last 72 hours. LFT No results for input(s): "PROT", "ALBUMIN", "AST", "ALT", "ALKPHOS", "BILITOT", "BILIDIR", "IBILI" in the last 72 hours. PT/INR No results for input(s): "LABPROT", "INR" in the last 72 hours.   Impression / Plan: This is a 76 y.o.male who presents for EGD with EUS with possible EMR gastric neuroendocrine tumors as well as gastrocutaneous fistula for attempted endoscopic closure.  The risks of an EUS including intestinal perforation, bleeding, infection, aspiration, and medication effects were discussed as was the possibility it may not give a definitive diagnosis if a biopsy is performed.  When a biopsy of the pancreas is done as part of the EUS, there is an additional risk of pancreatitis at the rate of about 1-2%.  It was explained that procedure related pancreatitis is typically mild, although it can be severe and even life threatening, which is why we do not perform random pancreatic biopsies and only biopsy a lesion/area we feel is concerning enough to warrant the risk.   The risks and benefits of endoscopic evaluation/treatment were discussed with the patient and/or family; these include but are not limited to the risk of perforation, infection, bleeding, missed lesions, lack of diagnosis, severe illness requiring  hospitalization, as well as anesthesia and sedation related illnesses.  The patient's history has been reviewed, patient examined, no change in status, and deemed stable for procedure.  The patient and/or family is agreeable to proceed.    Justice Britain, MD [facility] Advanced [facility]oscopy Office # [phone]

## 2022-07-11 NOTE — Telephone Encounter (Signed)
Recall entered for 3 month EUS/EMR/EGD  KUB entered  Appt made for 12/12 at 3 pm with Dr Loletha Carrow Letter mailed

## 2022-07-11 NOTE — Anesthesia Preprocedure Evaluation (Signed)
Anesthesia Evaluation  Patient identified by MRN, date of birth, ID band Patient awake    Reviewed: Allergy & Precautions, NPO status , Patient's Chart, lab work & pertinent test results, reviewed documented beta blocker date and time   History of Anesthesia Complications Negative for: history of anesthetic complications  Airway Mallampati: III  TM Distance: >3 FB Neck ROM: Full    Dental  (+) Dental Advisory Given, Teeth Intact   Pulmonary sleep apnea , former smoker,    Pulmonary exam normal        Cardiovascular hypertension, Pt. on medications and Pt. on home beta blockers + CAD, + Past MI, + Cardiac Stents and + Peripheral Vascular Disease  Normal cardiovascular exam     Neuro/Psych  Headaches, PSYCHIATRIC DISORDERS Depression  Transverse myelitis Occipital neuralgia Neuromyelitis optica   Neuromuscular disease (paraplegia)    GI/Hepatic Neg liver ROS,  S/p g-tube with non-healing site    Endo/Other  diabetes, Type 2, Insulin Dependent, Oral Hypoglycemic Agents  Renal/GU negative Renal ROS Bladder dysfunction      Musculoskeletal  Myofascial pain syndrome    Abdominal   Peds  Hematology  (+) Blood dyscrasia, anemia ,   Anesthesia Other Findings   Reproductive/Obstetrics                             Anesthesia Physical  Anesthesia Plan  ASA: 4  Anesthesia Plan: General   Post-op Pain Management: Minimal or no pain anticipated   Induction: Intravenous  PONV Risk Score and Plan: 2 and Treatment may vary due to age or medical condition, Ondansetron and Midazolam  Airway Management Planned: Oral ETT  Additional Equipment: None  Intra-op Plan:   Post-operative Plan: Extubation in OR  Informed Consent: I have reviewed the patients History and Physical, chart, labs and discussed the procedure including the risks, benefits and alternatives for the proposed anesthesia  with the patient or authorized representative who has indicated his/her understanding and acceptance.       Plan Discussed with: CRNA and Anesthesiologist  Anesthesia Plan Comments:         Anesthesia Quick Evaluation

## 2022-07-12 NOTE — Anesthesia Postprocedure Evaluation (Signed)
Anesthesia Post Note  Patient: Nicklaus Alviar.  Procedure(s) Performed: ESOPHAGOGASTRODUODENOSCOPY (EGD) WITH PROPOFOL HEMOSTASIS CLIP PLACEMENT HOT HEMOSTASIS (ARGON PLASMA COAGULATION/BICAP)     Patient location during evaluation: PACU Anesthesia Type: General Level of consciousness: awake and alert Pain management: pain level controlled Vital Signs Assessment: post-procedure vital signs reviewed and stable Respiratory status: spontaneous breathing, nonlabored ventilation and respiratory function stable Cardiovascular status: blood pressure returned to baseline and stable Postop Assessment: no apparent nausea or vomiting Anesthetic complications: no   No notable events documented.  Last Vitals:  Vitals:   07/11/22 1530 07/11/22 1540  BP: (!) 165/51 (!) 159/60  Pulse: 65 66  Resp: (!) 22 16  Temp:    SpO2: 94% 94%    Last Pain:  Vitals:   07/11/22 1540  TempSrc:   PainSc: 0-No pain                 Lynda Rainwater

## 2022-07-13 ENCOUNTER — Encounter (HOSPITAL_COMMUNITY): Payer: Self-pay | Admitting: Gastroenterology

## 2022-08-17 ENCOUNTER — Telehealth: Payer: Self-pay | Admitting: Gastroenterology

## 2022-08-17 NOTE — Telephone Encounter (Signed)
Brent [name],  This man for whom you did an [place] clip closure of a gastrostomy fistula on 07/11/2022 had a KUB at your request done on 08/05/2022.  You may have already received a copy of it, but there was a paper copy in my inbox that I have just seen after having been out of the office for over a week.  Makes no mention of a radiopaque metallic object such as a residual clip, so it may have dislodged by now.  There is also note of a gastrostomy tube, which I find curious because I was under the impression that had been removed.  He is scheduled to see me in about 2 weeks, and there are plans for you to do a repeat upper endoscopy for EUS for submucosal lesion since there was retained food on the last exam.  Let me know if you have any other advice or insights regarding this.  (Staff message also good)  -HD

## 2022-08-17 NOTE — Telephone Encounter (Signed)
HD, I had not actually seen this. Can you put it on my desk or a copy of it? Weird that they are saying he has a PEG because he does not.  I suspect they are seeing the clip.  In either case, may be when he comes in for your clinic visit you can see how things stand and have him go down and get a formal KUB 2 view. No other thoughts at this point.  Yes she is going to be due for repeat EUS and EGD in the next few months for the neuroendocrine tumor. Would be ideal if the clip is not present but if it is we may still be able to move forward with things. Thanks. GM

## 2022-08-17 NOTE — Telephone Encounter (Signed)
Thanks for the input.  I will send her for a KUB at the clinic visit.  - HD

## 2022-08-23 ENCOUNTER — Ambulatory Visit (INDEPENDENT_AMBULATORY_CARE_PROVIDER_SITE_OTHER): Payer: Medicare Other | Admitting: Neurology

## 2022-08-23 ENCOUNTER — Encounter: Payer: Self-pay | Admitting: Neurology

## 2022-08-23 VITALS — BP 122/77 | HR 77

## 2022-08-23 DIAGNOSIS — G360 Neuromyelitis optica [Devic]: Secondary | ICD-10-CM

## 2022-08-23 DIAGNOSIS — G8250 Quadriplegia, unspecified: Secondary | ICD-10-CM

## 2022-08-23 DIAGNOSIS — K592 Neurogenic bowel, not elsewhere classified: Secondary | ICD-10-CM

## 2022-08-23 DIAGNOSIS — Z79899 Other long term (current) drug therapy: Secondary | ICD-10-CM

## 2022-08-23 DIAGNOSIS — N319 Neuromuscular dysfunction of bladder, unspecified: Secondary | ICD-10-CM

## 2022-08-23 DIAGNOSIS — E559 Vitamin D deficiency, unspecified: Secondary | ICD-10-CM | POA: Diagnosis not present

## 2022-08-23 NOTE — Progress Notes (Signed)
History of Present Illness: He is on Rituxan for his at the [facility].  He has tolerated well.  We discussed that Rituxan can increase the risk of infection.  Early on with treatment he had an active sacral decubitus but it has continued to improve and heal.  He gets urinary tract infections periodically.  This has improved since he has a suprapubic catheter.  Late last year, we checked IgG and IgM and the values were fine.  The NMO antibody was back in the normal range December 2022  His neurologic status has been stable.  He has moderate to severe weakness in the hands and the mild to moderate left arm weakness and no strength in the legs.  He feels the left arm is slightly worse than it was last year.  He notes some numbness/tingling in the left hand, especially at night.  This is mostly in the third and fourth fingers.  He has a neurogenic bladder and now has a suprapubic catheter.  He has no visual problems.   For the bowel, he has has an ileostomy.     He recently saw ophthalmology.  He has a new pair glasses and was told he was 20/20 with the glasses...  He has not had optic neuritis.  [facility] HIstory: On March 08, 2020, while sitting at church, he started to have intense pain in his chest.  He was concerned about an MI and drove to his nearby house.   He continued to feel poorly and his wife called an ambulance.   He went to [facility] ED.and EKG was fine.  While in the ED, he started to experience numbness first in his right leg then left leg and then in the hands.     MRIs of the spine were initially felt to be non-contributary.   Initially a spinal stroke was suspected.   For one day, he had SOB and went to the ICU.   Anti-NMO Abs returned positive.   He received 5 day course of IV Solumedrol and 5 days IVIg.    Rituxan was infused 03/24/2020 and again 2 weeks later. He went to the Rehab floor and started to have some improvement.  Upon discharge, he went to skilled nursing facility.  Rituxan was continued     Past Medical History:  Diagnosis Date   Allergy    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 angioplasty of his RCA in 1990. b. bare metal stent placed in the RCA in 2000 followed by rotational atherectomy shortly after for stent restenosis. c. last cath was in 2012 showing stable moderate diffuse CAD. (70% mid LAD, 80% diagonal, 70% Ramus, 40% mid to distal RCA stent restenosis). d. Low risk nuc in 2015.   Diabetes mellitus    Diverticulosis    Elevated CK    Erectile dysfunction    Hemorrhoids    HTN (hypertension)    Hyperlipidemia    Hypertriglyceridemia    Malignant melanoma of left side of neck ([place]) 10/25/2018   Myocardial infarction ([place])    Obesity    OSA (obstructive sleep apnea)    Persistent disorder of initiating or maintaining sleep    Personal history of colonic polyps 02/05/2003     Past Surgical History:  Procedure Laterality Date   BIOPSY  03/26/2022   Procedure: BIOPSY;  Surgeon: Gatha Mayer, MD;  Location: [place];  Service: Gastroenterology;;   CHOLECYSTECTOMY     CORONARY ANGIOPLASTY  CORONARY STENT PLACEMENT     stenting of the right coronary artery with followup rotational  atherectomy. (3 stents placed)   ESOPHAGOGASTRODUODENOSCOPY (EGD) WITH PROPOFOL N/A 03/26/2022   Procedure: ESOPHAGOGASTRODUODENOSCOPY (EGD) WITH PROPOFOL;  Surgeon: Gatha Mayer, MD;  Location: [place];  Service: Gastroenterology;  Laterality: N/A;   ESOPHAGOGASTRODUODENOSCOPY (EGD) WITH PROPOFOL N/A 07/11/2022   Procedure: ESOPHAGOGASTRODUODENOSCOPY (EGD) WITH PROPOFOL;  Surgeon: Rush Landmark Telford Nab., MD;  Location: [facility];  Service: Gastroenterology;  Laterality: N/A;   FINGER SURGERY     right   FOOT SURGERY     right   HEMOSTASIS CLIP PLACEMENT  07/11/2022   Procedure: HEMOSTASIS CLIP PLACEMENT;  Surgeon: Irving Copas., MD;  Location: [facility];  Service: Gastroenterology;;  ovesco    HOT  HEMOSTASIS N/A 07/11/2022   Procedure: HOT HEMOSTASIS (ARGON PLASMA COAGULATION/BICAP);  Surgeon: Irving Copas., MD;  Location: [facility];  Service: Gastroenterology;  Laterality: N/A;   INGUINAL HERNIA REPAIR     right   IR GASTROSTOMY TUBE REMOVAL  02/10/2022   IRRIGATION AND DEBRIDEMENT ABSCESS N/A 06/04/2020   Procedure: IRRIGATION AND DEBRIDEMENT SACRAL WOUND;  Surgeon: Jesusita Oka, MD;  Location: [place];  Service: General;  Laterality: N/A;   LAPAROSCOPY N/A 06/04/2020   Procedure: LAPAROSCOPY ASSISTED COLOSTOMY, LAPAROSCOPY GASTROSTOMY;  Surgeon: Jesusita Oka, MD;  Location: [place];  Service: General;  Laterality: N/A;   LYSIS OF ADHESION N/A 06/04/2020   Procedure: LYSIS OF ADHESION;  Surgeon: Jesusita Oka, MD;  Location: [place];  Service: General;  Laterality: N/A;   melanoma removal     neck   ORTHOPEDIC SURGERY     foot right   rotator cuff surg     Bil   Family History  Problem Relation Age of Onset   Lung cancer Mother    COPD Mother    Obesity Mother    Liver cancer Father    Lung cancer Father    Heart disease Father    Heart disease Sister    Colon cancer Neg Hx    Stomach cancer Neg Hx    Pancreatic cancer Neg Hx    Esophageal cancer Neg Hx    Inflammatory bowel disease Neg Hx    Liver disease Neg Hx    Rectal cancer Neg Hx      PHYSICAL Vitals:   08/23/22 1301  BP: 122/77  Pulse: 77     General: The patient is well-developed and well-nourished and in no acute distress   HEENT:  Head is [place]/AT.  Sclera are anicteric. VA is 20/20 OS and 20/20 OD.  Symmetric color vision    Skin: Extremities show mild edema at the ankles.  No rashes.   Neurologic Exam   Mental status: The patient is alert and oriented x 3 at the time of the examination. The patient has apparent normal recent and remote memory, with an apparently normal attention span and concentration ability.   Speech is normal.   Cranial nerves: Extraocular movements are full.  Pupils are equal, round, and reactive to light and accomodation.   Color vision was symmetric.  Facial strength and sensation was normal.   No dysarthria is noted.  No obvious hearing deficits are noted.   Motor:  Muscle bulk is normal.   In the right arm, strength was 5/5 in the deltoid and biceps, 4/5 in the triceps, 3/5 in finger flexors and extensors and 1 in the intrinsic hand muscles.  In the left arm,  strength was 4+/5 in the biceps, 3/5 in the triceps, 2/5 in finger flexors and 0-1 in finger extensors and intrinsic hand muscles.  Strength was 0/5 in both legs.   Sensory: In the legs, he has reduced sensation to temperature but more normal sensation to touch.  He has fairly normal sensation to vibration.   Coordination: Cerebellar testing reveals mildly reduced finger-nose-finger.  He could not do heel-to-shin.   Gait and station: He is unable to stand   Reflexes: Deep tendon reflexes are 3+ at the knees and 2+ at the ankles..    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  Continue with rituximab 1000 mg x 2 separated by 2 weeks every 6 months   We will check labs If dysesthesias worsen, consider adding gabapentin or lamotrigine.   return in 6 months or sooner if there are new or worsening neurologic symptoms.  Seham Gardenhire A. Felecia Shelling, MD, PhD, FAAN Certified in Neurology, Clinical Neurophysiology, Sleep Medicine, Pain Medicine and Neuroimaging Director, [facility] [address] [place], [place] [address] [phone]

## 2022-08-25 ENCOUNTER — Ambulatory Visit: Payer: Medicare Other | Admitting: Neurology

## 2022-08-26 LAB — CBC WITH DIFFERENTIAL/PLATELET
Basophils Absolute: 0 10*3/uL (ref 0.0–0.2)
Basos: 0 %
EOS (ABSOLUTE): 0 10*3/uL (ref 0.0–0.4)
Eos: 0 %
Hematocrit: 39.4 % (ref 37.5–51.0)
Hemoglobin: 12.4 g/dL — ABNORMAL LOW (ref 13.0–17.7)
Immature Grans (Abs): 0 10*3/uL (ref 0.0–0.1)
Immature Granulocytes: 0 %
Lymphocytes Absolute: 0.8 10*3/uL (ref 0.7–3.1)
Lymphs: 7 %
MCH: 27.1 pg (ref 26.6–33.0)
MCHC: 31.5 g/dL (ref 31.5–35.7)
MCV: 86 fL (ref 79–97)
Monocytes Absolute: 0.1 10*3/uL (ref 0.1–0.9)
Monocytes: 1 %
Neutrophils Absolute: 10.4 10*3/uL — ABNORMAL HIGH (ref 1.4–7.0)
Neutrophils: 92 %
Platelets: 283 10*3/uL (ref 150–450)
RBC: 4.58 x10E6/uL (ref 4.14–5.80)
RDW: 15.8 % — ABNORMAL HIGH (ref 11.6–15.4)
WBC: 11.3 10*3/uL — ABNORMAL HIGH (ref 3.4–10.8)

## 2022-08-26 LAB — IGG, IGA, IGM
IgA/Immunoglobulin A, Serum: 222 mg/dL (ref 61–437)
IgG (Immunoglobin G), Serum: 768 mg/dL (ref 603–1613)
IgM (Immunoglobulin M), Srm: 47 mg/dL (ref 15–143)

## 2022-08-26 LAB — VITAMIN D 25 HYDROXY (VIT D DEFICIENCY, FRACTURES): Vit D, 25-Hydroxy: 23.5 ng/mL — ABNORMAL LOW (ref 30.0–100.0)

## 2022-08-26 LAB — NEUROMYELITIS OPTICA AUTOAB, IGG: NMO IgG Autoantibodies: <1.5 U/mL (ref 0.0–3.0)

## 2022-08-30 ENCOUNTER — Telehealth: Payer: Self-pay | Admitting: Gastroenterology

## 2022-08-30 ENCOUNTER — Ambulatory Visit (HOSPITAL_COMMUNITY)
Admission: RE | Admit: 2022-08-30 | Discharge: 2022-08-30 | Disposition: A | Discharge status: Home / Self Care | Payer: Medicare Other | Source: Ambulatory Visit | Attending: Gastroenterology | Admitting: Gastroenterology

## 2022-08-30 ENCOUNTER — Ambulatory Visit (INDEPENDENT_AMBULATORY_CARE_PROVIDER_SITE_OTHER): Payer: Medicare Other | Admitting: Gastroenterology

## 2022-08-30 ENCOUNTER — Encounter: Payer: Self-pay | Admitting: Gastroenterology

## 2022-08-30 VITALS — BP 140/64 | HR 72 | Ht 72.0 in

## 2022-08-30 DIAGNOSIS — K316 Fistula of stomach and duodenum: Secondary | ICD-10-CM | POA: Diagnosis present

## 2022-08-30 NOTE — Telephone Encounter (Signed)
Patient went over to [name] xray to have his KUB completed.

## 2022-08-30 NOTE — Patient Instructions (Signed)
_______________________________________________________  If you are age 78 or older, your body mass index should be between 23-30. Your Body mass index is 27.8 kg/m. If this is out of the aforementioned range listed, please consider follow up with your Primary Care Provider.  If you are age 48 or younger, your body mass index should be between 19-25. Your Body mass index is 27.8 kg/m. If this is out of the aformentioned range listed, please consider follow up with your Primary Care Provider.   ________________________________________________________  The [facility] providers would like to encourage you to use [facility] to communicate with providers for non-urgent requests or questions.  Due to long hold times on the telephone, sending your provider a message by [facility] may be a faster and more efficient way to get a response.  Please allow 48 business hours for a response.  Please remember that this is for non-urgent requests.  _______________________________________________________  Please go to the basement level to have an xray completed today.   Due to recent changes in healthcare laws, you may see the results of your imaging and laboratory studies on MyChart before your provider has had a chance to review them.  We understand that in some cases there may be results that are confusing or concerning to you. Not all laboratory results come back in the same time frame and the provider may be waiting for multiple results in order to interpret others.  Please give [place] 48 hours in order for your provider to thoroughly review all the results before contacting the office for clarification of your results.    It was a pleasure to see you today!  Thank you for trusting me with your gastrointestinal care!

## 2022-08-30 NOTE — Telephone Encounter (Signed)
Incoming call from the lab downstairs states they will not be able to do xray on patient due to the fact he is in a wheelchair states he will need to go to the hospital. Please advise

## 2022-08-30 NOTE — Progress Notes (Signed)
[facility] Consult Note:  History: Brent [name]. 08/30/2022  Referring provider: Raymondo Band, MD  Reason for consult/chief complaint: gasttrcutaneous fistula  (Pt states he is doing well today)   Subjective  HPI: Brent [name] is following up with me after recent endoscopic procedure with Dr. Rush Landmark for attempted closure of his nonhealing gastrostomy tube site.  On prior inpatient upper endoscopy, Dr. Carlean Purl had also discovered an incidental 8 to 10 mm well-differentiated neuroendocrine tumor in the gastric body.  Dr. Rush Landmark had intended to perform EUS and removal of that lesion at the recent procedure, but retained gastric food precluded that.  However, he was able to perform APC at the gastrostomy site in place and over the scope clip.  Subsequent KUB was done to assess for presence or passage of the clip, and the report was somewhat puzzling since it did not describe any metallic object, but reported no visible feeding tube.  Brent [name] says that the fistula closure procedure seem to work well in the first few weeks, where he had a significant reduction in output from the fistulous tract.  However, there is still fluid leakage from it and he is wearing a collection bag.  He no longer has food or pills, and the volume of liquid is significantly decreased from what it was previously. He requires assistance with feeding, but he is able to eat and maintain his nutrition orally.   ROS:  Review of Systems He is wheelchair-bound from his neurologic condition.  Past Medical History: Past Medical History:  Diagnosis Date   Allergy    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 angioplasty of his RCA in 1990. b. bare metal stent placed in the RCA in 2000 followed by rotational atherectomy shortly after for stent restenosis. c. last cath was in 2012 showing stable moderate diffuse CAD. (70% mid LAD, 80% diagonal, 70%  Ramus, 40% mid to distal RCA stent restenosis). d. Low risk nuc in 2015.   Diabetes mellitus    Diverticulosis    Elevated CK    Erectile dysfunction    Hemorrhoids    HTN (hypertension)    Hyperlipidemia    Hypertriglyceridemia    Malignant melanoma of left side of neck ([place]) 10/25/2018   Myocardial infarction (HCC)    Obesity    OSA (obstructive sleep apnea)    Persistent disorder of initiating or maintaining sleep    Personal history of colonic polyps 02/05/2003     Past Surgical History: Past Surgical History:  Procedure Laterality Date   BIOPSY  03/26/2022   Procedure: BIOPSY;  Surgeon: Gatha Mayer, MD;  Location: [facility] ENDOSCOPY;  Service: Gastroenterology;;   CHOLECYSTECTOMY     CORONARY ANGIOPLASTY     CORONARY STENT PLACEMENT     stenting of the right coronary artery with followup rotational  atherectomy. (3 stents placed)   ESOPHAGOGASTRODUODENOSCOPY (EGD) WITH PROPOFOL N/A 03/26/2022   Procedure: ESOPHAGOGASTRODUODENOSCOPY (EGD) WITH PROPOFOL;  Surgeon: Gatha Mayer, MD;  Location: [place];  Service: Gastroenterology;  Laterality: N/A;   ESOPHAGOGASTRODUODENOSCOPY (EGD) WITH PROPOFOL N/A 07/11/2022   Procedure: ESOPHAGOGASTRODUODENOSCOPY (EGD) WITH PROPOFOL;  Surgeon: Rush Landmark Telford Nab., MD;  Location: [facility] ENDOSCOPY;  Service: Gastroenterology;  Laterality: N/A;   FINGER SURGERY     right   FOOT SURGERY     right   HEMOSTASIS CLIP PLACEMENT  07/11/2022   Procedure: HEMOSTASIS CLIP PLACEMENT;  Surgeon: Rush Landmark Telford Nab., MD;  Location:  [facility] ENDOSCOPY;  Service: Gastroenterology;;  ovesco    HOT HEMOSTASIS N/A 07/11/2022   Procedure: HOT HEMOSTASIS (ARGON PLASMA COAGULATION/BICAP);  Surgeon: Irving Copas., MD;  Location: [facility];  Service: Gastroenterology;  Laterality: N/A;   INGUINAL HERNIA REPAIR     right   IR GASTROSTOMY TUBE REMOVAL  02/10/2022   IRRIGATION AND DEBRIDEMENT ABSCESS N/A 06/04/2020   Procedure: IRRIGATION AND DEBRIDEMENT  SACRAL WOUND;  Surgeon: Jesusita Oka, MD;  Location: [place];  Service: General;  Laterality: N/A;   LAPAROSCOPY N/A 06/04/2020   Procedure: LAPAROSCOPY ASSISTED COLOSTOMY, LAPAROSCOPY GASTROSTOMY;  Surgeon: Jesusita Oka, MD;  Location: [place];  Service: General;  Laterality: N/A;   LYSIS OF ADHESION N/A 06/04/2020   Procedure: LYSIS OF ADHESION;  Surgeon: Jesusita Oka, MD;  Location: [place];  Service: General;  Laterality: N/A;   melanoma removal     neck   ORTHOPEDIC SURGERY     foot right   rotator cuff surg     Bil     Family History: Family History  Problem Relation Age of Onset   Lung cancer Mother    COPD Mother    Obesity Mother    Liver cancer Father    Lung cancer Father    Heart disease Father    Heart disease Sister    Colon cancer Neg Hx    Stomach cancer Neg Hx    Pancreatic cancer Neg Hx    Esophageal cancer Neg Hx    Inflammatory bowel disease Neg Hx    Liver disease Neg Hx    Rectal cancer Neg Hx     Social History: Social History   Socioeconomic History   Marital status: Married    Spouse name: Not on file   Number of children: 1   Years of education: Not on file   Highest education level: Not on file  Occupational History   Occupation: Teacher, English as a foreign language    Comment: [employer] [employer]  Tobacco Use   Smoking status: Former    Packs/day: 1.50    Years: 30.00    Total pack years: 45.00    Types: Cigarettes    Quit date: 09/19/1980    Years since quitting: 41.9   Smokeless tobacco: Never  Vaping Use   Vaping Use: Never used  Substance and Sexual Activity   Alcohol use: Not Currently    Alcohol/week: 7.0 - 14.0 standard drinks of alcohol    Types: 7 - 14 Shots of liquor per week   Drug use: No   Sexual activity: Not Currently    Partners: Female  Other Topics Concern   Not on file  Social History Narrative   12/14/2018: Lives with wife and 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 (son had passed away several years ago)   Recevies much of his care through [place]    Enjoys golfing   Social Determinants of Health   Financial Resource Strain: Low Risk  (03/06/2020)   Overall Financial Resource Strain (CARDIA)    Difficulty of Paying Living Expenses: Not hard at all  Food Insecurity: No Food Insecurity (12/14/2018)   Hunger Vital Sign    Worried About Running Out of Food in the Last Year: Never true    Ran Out of Food in the Last Year: Never true  Transportation Needs: No Transportation Needs (03/06/2020)   PRAPARE - Hydrologist (Medical): No    Lack of Transportation (Non-Medical): No  Physical Activity: Inactive (12/14/2018)  Exercise Vital Sign    Days of Exercise per Week: 0 days    Minutes of Exercise per Session: 0 min  Stress: No Stress Concern Present (12/14/2018)   [place]    Feeling of Stress : Not at all  Social Connections: Unknown (12/14/2018)   Social Connection and Isolation Panel [NHANES]    Frequency of Communication with Friends and Family: Once a week    Frequency of Social Gatherings with Friends and Family: Never    Attends Religious Services: Not on Advertising copywriter or Organizations: Not on file    Attends Archivist Meetings: Not on file    Marital Status: Married    Allergies: Allergies  Allergen Reactions   Gabapentin Other (See Comments)    Unknown   Levofloxacin Rash   Penicillins Hives and Rash    Tolerated Unasyn, Augmentin, cefepime and cephalexin    Outpatient Meds: Current Outpatient Medications  Medication Sig Dispense Refill   acetaminophen (TYLENOL) 500 MG tablet Take 500 mg by mouth every 6 (six) hours as needed for moderate pain.     albuterol (PROVENTIL) (2.5 MG/3ML) 0.083% nebulizer solution Take 3 mLs (2.5 mg total) by nebulization every 6 (six) hours as needed for wheezing or shortness of breath (or anaphylaxis due to Rituxan infusion).     Alogliptin Benzoate 25 MG TABS Take 25  mg by mouth daily.     Amino Acids-Protein Hydrolys (FEEDING SUPPLEMENT, PRO-STAT 64,) LIQD Take 30 mLs by mouth in the morning and at bedtime.     aspirin EC 81 MG tablet Take 81 mg by mouth daily. Swallow whole.     atenolol (TENORMIN) 25 MG tablet Take 25 mg by mouth at bedtime.     atorvastatin (LIPITOR) 20 MG tablet Take 1 tablet (20 mg total) by mouth daily. (Patient taking differently: Take 20 mg by mouth at bedtime.)     baclofen (LIORESAL) 10 MG tablet Take 10 mg by mouth 3 (three) times daily.     cephALEXin (KEFLEX) 250 MG capsule Take 250 mg by mouth daily. continuous     cholecalciferol (VITAMIN D3) 25 MCG (1000 UNIT) tablet Take 3,000 Units by mouth daily.     famotidine (PEPCID) 20 MG tablet Take 20 mg by mouth 2 (two) times daily.     ferrous sulfate 325 (65 FE) MG tablet Take 325 mg by mouth 2 (two) times daily.     fexofenadine (ALLEGRA) 180 MG tablet Take 180 mg by mouth daily.     insulin aspart (NOVOLOG) 100 UNIT/ML injection Inject 0-15 Units into the skin every 4 (four) hours. Sliding scale  CBG 70 - 120: 0 units: CBG 121 - 150: 2 units; CBG 151 - 200: 3 units; CBG 201 - 250: 5 units; CBG 251 - 300: 8 units;CBG 301 - 350: 11 units; CBG 351 - 400: 15 units; CBG > 400 : 15 units and notify MD (Patient taking differently: Inject 18 Units into the skin 3 (three) times daily before meals.) 10 mL 11   insulin glargine (LANTUS) 100 unit/mL SOPN Inject 15 Units into the skin at bedtime. (Patient taking differently: Inject 54 Units into the skin daily.) 15 mL 11   losartan (COZAAR) 50 MG tablet Take 50 mg by mouth daily.     magnesium hydroxide (MILK OF MAGNESIA) 400 MG/5ML suspension Take 30 mLs by mouth daily as needed for mild constipation.  Magnesium Oxide, Elemental, 400 MG TABS Take 400 mg by mouth 3 (three) times daily.     Menthol, Topical Analgesic, (BIOFREEZE) 4 % GEL Apply 1 application  topically 2 (two) times daily as needed (Pain to back upper and lower arms and  shoulders).     metFORMIN (GLUCOPHAGE) 500 MG tablet Take 1,000 mg by mouth 2 (two) times daily with a meal.     Multiple Vitamins-Minerals (MULTIVITAMIN WITH MINERALS) tablet Take 1 tablet by mouth daily.     omeprazole (PRILOSEC) 40 MG capsule Take 1 capsule (40 mg total) by mouth daily. 30 capsule 12   PSYLLIUM PO Take 0.8 g by mouth in the morning and at bedtime.     senna-docusate (SENOKOT-S) 8.6-50 MG tablet Take 1 tablet by mouth 2 (two) times daily.     sertraline (ZOLOFT) 25 MG tablet Take 25 mg by mouth daily.     traZODone (DESYREL) 50 MG tablet Take 75 mg by mouth at bedtime.     vitamin B-12 (CYANOCOBALAMIN) 1000 MCG tablet Take 2,000 mcg by mouth daily.     vitamin C (ASCORBIC ACID) 500 MG tablet Take 500 mg by mouth daily.     azelastine (OPTIVAR) 0.05 % ophthalmic solution Place 1 drop into both eyes 2 (two) times daily as needed (allergies).     OxyCODONE HCl, Abuse Deter, (OXAYDO) 5 MG TABA Take 5 mg by mouth every 8 (eight) hours as needed (pain).     phenol (CHLORASEPTIC) 1.4 % LIQD Use as directed 2 sprays in the mouth or throat every 4 (four) hours as needed for throat irritation / pain.     Polyethyl Glycol-Propyl Glycol (GENTEAL TEARS SEVERE DAY/NIGHT) 0.4-0.3 % GEL ophthalmic gel Place 1 Application into both eyes 4 (four) times daily as needed (Dry Eyes).     No current facility-administered medications for this visit.      ___________________________________________________________________ Objective   Exam:  BP (!) 140/64   Pulse 72   Ht 6' (1.829 m)   BMI 27.80 kg/m  Wt Readings from Last 3 Encounters:  07/11/22 205 lb (93 kg)  05/26/22 199 lb (90.3 kg)  03/26/22 190 lb 0.6 oz (86.2 kg)   Wheelchair-bound, alert and conversational, normal mental status GI: soft, obese, no tenderness, with active bowel sounds.  G-tube fistula site partially seen with overlying collection bag.  There is straw-colored fluid in the collection bag.  No erythema, no  tenderness at G-tube site.  He has a left lower quadrant colostomy and a suprapubic catheter (which is most likely what the radiologist referred to on the recent KUB) Skin; warm and dry, no rash or jaundice noted Neuro: awake, alert and oriented x 3. Normal gross motor function and fluent speech   Radiologic Studies: KUB report as above  Assessment: Encounter Diagnosis  Name Primary?   Gastrocutaneous fistula Yes    Partial improvement in leakage from the gastrocutaneous fistula at prior PEG site.  Clip is most likely come off by now, which is expected.  I do not know if it is feasible to make another attempt at the same procedure to hopefully close this fistula down for good. Unknown why he had retained food at the time of recent endoscopic procedure, as he says he had not eaten since about 6 PM the night prior.  He may have some degree of delayed gastric emptying due to his immobility. Plan:  KUB today.  If endoscopic clip no longer seen, perhaps it is feasible to perform  a repeat EGD with same intervention to hopefully close down the fistula.  He needs an EUS with removal of the gastric neuroendocrine tumor as well, and perhaps that can be done at the same time.  Will communicate with Dr. Rush Landmark after KUB report.  All of this was explained to [place] and he was agreeable as well as greatly appreciative of Dr. Donneta Romberg help.  Thank you for the courtesy of this consult.  Please call me with any questions or concerns.  Nelida Meuse III  CC: Referring provider noted above

## 2022-09-02 ENCOUNTER — Encounter: Payer: Self-pay | Admitting: Gastroenterology

## 2022-09-07 ENCOUNTER — Other Ambulatory Visit: Payer: Self-pay

## 2022-09-07 DIAGNOSIS — K316 Fistula of stomach and duodenum: Secondary | ICD-10-CM

## 2022-09-07 DIAGNOSIS — C7A8 Other malignant neuroendocrine tumors: Secondary | ICD-10-CM

## 2022-09-07 DIAGNOSIS — R933 Abnormal findings on diagnostic imaging of other parts of digestive tract: Secondary | ICD-10-CM

## 2022-09-07 DIAGNOSIS — D509 Iron deficiency anemia, unspecified: Secondary | ICD-10-CM

## 2022-09-07 NOTE — Telephone Encounter (Signed)
The pt has been scheduled and instructed.  

## 2022-09-07 NOTE — Telephone Encounter (Signed)
Patty,    Please see GM's recent KUB result note on this patient for procedure scheduling.  Thanks  - HD

## 2022-09-13 ENCOUNTER — Telehealth: Payer: Self-pay | Admitting: *Deleted

## 2022-09-13 NOTE — Telephone Encounter (Signed)
Dr. Felecia Shelling sent mychart to patient 09/05/22: "The lab test show that the Rituxan has helped to lower the abnormal NMO antibody against the spinal cord.  It is now in the normal range. The vitamin D was a little bit low and I would like you to take 5000 units a day over-the-counter (let us know if we need to fax an order in for you)"   He has not read yet so he asked we f/u. I called and LVM for pt to call office.

## 2022-09-20 NOTE — Telephone Encounter (Signed)
LVM returning pt call. 

## 2022-09-20 NOTE — Telephone Encounter (Signed)
Pt returned phone call, would like a call back.  

## 2022-10-06 ENCOUNTER — Encounter (HOSPITAL_COMMUNITY): Payer: Self-pay | Admitting: Gastroenterology

## 2022-10-13 ENCOUNTER — Ambulatory Visit (HOSPITAL_BASED_OUTPATIENT_CLINIC_OR_DEPARTMENT_OTHER): Payer: Medicare Other | Admitting: Certified Registered Nurse Anesthetist

## 2022-10-13 ENCOUNTER — Encounter (HOSPITAL_COMMUNITY): Payer: Self-pay | Admitting: Gastroenterology

## 2022-10-13 ENCOUNTER — Other Ambulatory Visit: Payer: Self-pay

## 2022-10-13 ENCOUNTER — Encounter (HOSPITAL_COMMUNITY)
Admission: RE | Disposition: A | Discharge status: Home / Self Care | Payer: Self-pay | Source: Ambulatory Visit | Attending: Gastroenterology

## 2022-10-13 ENCOUNTER — Ambulatory Visit (HOSPITAL_COMMUNITY): Payer: Medicare Other | Admitting: Certified Registered Nurse Anesthetist

## 2022-10-13 ENCOUNTER — Ambulatory Visit (HOSPITAL_COMMUNITY)
Admission: RE | Admit: 2022-10-13 | Discharge: 2022-10-13 | Disposition: A | Discharge status: Home / Self Care | Payer: Medicare Other | Source: Ambulatory Visit | Attending: Gastroenterology | Admitting: Gastroenterology

## 2022-10-13 DIAGNOSIS — D3A8 Other benign neuroendocrine tumors: Secondary | ICD-10-CM | POA: Diagnosis not present

## 2022-10-13 DIAGNOSIS — E1151 Type 2 diabetes mellitus with diabetic peripheral angiopathy without gangrene: Secondary | ICD-10-CM | POA: Insufficient documentation

## 2022-10-13 DIAGNOSIS — Z794 Long term (current) use of insulin: Secondary | ICD-10-CM | POA: Diagnosis not present

## 2022-10-13 DIAGNOSIS — K317 Polyp of stomach and duodenum: Secondary | ICD-10-CM

## 2022-10-13 DIAGNOSIS — K316 Fistula of stomach and duodenum: Secondary | ICD-10-CM | POA: Insufficient documentation

## 2022-10-13 DIAGNOSIS — K3189 Other diseases of stomach and duodenum: Secondary | ICD-10-CM | POA: Diagnosis not present

## 2022-10-13 DIAGNOSIS — K2289 Other specified disease of esophagus: Secondary | ICD-10-CM | POA: Diagnosis not present

## 2022-10-13 DIAGNOSIS — I2510 Atherosclerotic heart disease of native coronary artery without angina pectoris: Secondary | ICD-10-CM | POA: Diagnosis not present

## 2022-10-13 DIAGNOSIS — Z7984 Long term (current) use of oral hypoglycemic drugs: Secondary | ICD-10-CM | POA: Insufficient documentation

## 2022-10-13 DIAGNOSIS — T182XXA Foreign body in stomach, initial encounter: Secondary | ICD-10-CM | POA: Diagnosis not present

## 2022-10-13 DIAGNOSIS — Z955 Presence of coronary angioplasty implant and graft: Secondary | ICD-10-CM | POA: Insufficient documentation

## 2022-10-13 DIAGNOSIS — G4733 Obstructive sleep apnea (adult) (pediatric): Secondary | ICD-10-CM | POA: Insufficient documentation

## 2022-10-13 DIAGNOSIS — Z9989 Dependence on other enabling machines and devices: Secondary | ICD-10-CM

## 2022-10-13 DIAGNOSIS — I10 Essential (primary) hypertension: Secondary | ICD-10-CM | POA: Diagnosis not present

## 2022-10-13 DIAGNOSIS — I252 Old myocardial infarction: Secondary | ICD-10-CM | POA: Insufficient documentation

## 2022-10-13 HISTORY — PX: SUBMUCOSAL LIFTING INJECTION: SHX6855

## 2022-10-13 HISTORY — PX: ESOPHAGOGASTRODUODENOSCOPY (EGD) WITH PROPOFOL: SHX5813

## 2022-10-13 HISTORY — PX: POLYPECTOMY: SHX5525

## 2022-10-13 HISTORY — PX: EUS: SHX5427

## 2022-10-13 HISTORY — PX: HOT HEMOSTASIS: SHX5433

## 2022-10-13 HISTORY — PX: HEMOSTASIS CLIP PLACEMENT: SHX6857

## 2022-10-13 HISTORY — PX: SUBMUCOSAL TATTOO INJECTION: SHX6856

## 2022-10-13 HISTORY — PX: STENT REMOVAL: SHX6421

## 2022-10-13 HISTORY — PX: ENDOSCOPIC MUCOSAL RESECTION: SHX6839

## 2022-10-13 LAB — GLUCOSE, CAPILLARY: Glucose-Capillary: 136 mg/dL — ABNORMAL HIGH (ref 70–99)

## 2022-10-13 SURGERY — ESOPHAGOGASTRODUODENOSCOPY (EGD) WITH PROPOFOL
Anesthesia: General

## 2022-10-13 MED ORDER — SPOT INK MARKER SYRINGE KIT
PACK | SUBMUCOSAL | Status: AC
  Filled 2022-10-13: qty 5

## 2022-10-13 MED ORDER — DEXAMETHASONE SODIUM PHOSPHATE 10 MG/ML IJ SOLN
INTRAMUSCULAR | Status: DC | PRN
  Administered 2022-10-13: 10 mg via INTRAVENOUS

## 2022-10-13 MED ORDER — SUGAMMADEX SODIUM 200 MG/2ML IV SOLN
INTRAVENOUS | Status: DC | PRN
  Administered 2022-10-13: 100 mg via INTRAVENOUS

## 2022-10-13 MED ORDER — EPHEDRINE SULFATE-NACL 50-0.9 MG/10ML-% IV SOSY
PREFILLED_SYRINGE | INTRAVENOUS | Status: DC | PRN
  Administered 2022-10-13: 10 mg via INTRAVENOUS
  Administered 2022-10-13 (×2): 5 mg via INTRAVENOUS

## 2022-10-13 MED ORDER — PROPOFOL 10 MG/ML IV BOLUS
INTRAVENOUS | Status: DC | PRN
  Administered 2022-10-13: 100 mg via INTRAVENOUS

## 2022-10-13 MED ORDER — OMEPRAZOLE 40 MG PO CPDR: 40.0000 mg | DELAYED_RELEASE_CAPSULE | Freq: Two times a day (BID) | ORAL | 12 refills | Status: DC

## 2022-10-13 MED ORDER — SPOT INK MARKER SYRINGE KIT
PACK | SUBMUCOSAL | Status: DC | PRN
  Administered 2022-10-13: 2 mL via SUBMUCOSAL

## 2022-10-13 MED ORDER — ONDANSETRON HCL 4 MG/2ML IJ SOLN
INTRAMUSCULAR | Status: DC | PRN
  Administered 2022-10-13: 4 mg via INTRAVENOUS

## 2022-10-13 MED ORDER — LACTATED RINGERS IV SOLN: INTRAVENOUS | Status: DC | PRN

## 2022-10-13 MED ORDER — LIDOCAINE 2% (20 MG/ML) 5 ML SYRINGE
INTRAMUSCULAR | Status: DC | PRN
  Administered 2022-10-13: 80 mg via INTRAVENOUS

## 2022-10-13 MED ORDER — FENTANYL CITRATE (PF) 100 MCG/2ML IJ SOLN
INTRAMUSCULAR | Status: DC | PRN
  Administered 2022-10-13: 100 ug via INTRAVENOUS

## 2022-10-13 MED ORDER — PROPOFOL 10 MG/ML IV BOLUS
INTRAVENOUS | Status: AC
  Filled 2022-10-13: qty 20

## 2022-10-13 MED ORDER — SODIUM CHLORIDE 0.9 % IV SOLN
1.5000 g | Freq: Once | INTRAVENOUS | Status: AC
  Administered 2022-10-13: 1.5 g via INTRAVENOUS
  Filled 2022-10-13: qty 4

## 2022-10-13 MED ORDER — AMOXICILLIN-POT CLAVULANATE 875-125 MG PO TABS: 1.0000 | ORAL_TABLET | Freq: Two times a day (BID) | ORAL | 0 refills | Status: AC

## 2022-10-13 MED ORDER — FENTANYL CITRATE (PF) 100 MCG/2ML IJ SOLN
INTRAMUSCULAR | Status: AC
  Filled 2022-10-13: qty 2

## 2022-10-13 MED ORDER — ROCURONIUM BROMIDE 10 MG/ML (PF) SYRINGE
PREFILLED_SYRINGE | INTRAVENOUS | Status: DC | PRN
  Administered 2022-10-13: 60 mg via INTRAVENOUS

## 2022-10-13 SURGICAL SUPPLY — 15 items

## 2022-10-13 NOTE — Transfer of Care (Signed)
Immediate Anesthesia Transfer of Care Note  Patient: Loomis Anacker.  Procedure(s) Performed: Procedure(s): ESOPHAGOGASTRODUODENOSCOPY (EGD) WITH PROPOFOL (N/A) UPPER ENDOSCOPIC ULTRASOUND (EUS) RADIAL (N/A) HOT HEMOSTASIS (ARGON PLASMA COAGULATION/BICAP) (N/A) HEMOSTASIS CLIP PLACEMENT SUBMUCOSAL LIFTING INJECTION  Patient Location: PACU  Anesthesia Type:General  Level of Consciousness: Alert, Awake, Oriented  Airway & Oxygen Therapy: Patient Spontanous Breathing  Post-op Assessment: Report given to RN  Post vital signs: Reviewed and stable  Last Vitals:  Vitals:   10/13/22 1150  BP: (!) 133/54  Pulse: (!) 58  Resp: 14  Temp: 36.6 C  SpO2: 741%    Complications: No apparent anesthesia complications

## 2022-10-13 NOTE — H&P (Addendum)
GASTROENTEROLOGY PROCEDURE H&P NOTE   Primary Care Physician: Raymondo Band, MD  HPI: Brent [name]. is a 77 y.o. male who presents for EGD/EUS with repeat attempt at GCF closure and also possible attempt at Gastric NET removal.  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 angioplasty of his RCA in 1990. b. bare metal stent placed in the RCA in 2000 followed by rotational atherectomy shortly after for stent restenosis. c. last cath was in 2012 showing stable moderate diffuse CAD. (70% mid LAD, 80% diagonal, 70% Ramus, 40% mid to distal RCA stent restenosis). d. Low risk nuc in 2015.   Diabetes mellitus    Diverticulosis    Elevated CK    Erectile dysfunction    Hemorrhoids    HTN (hypertension)    Hyperlipidemia    Hypertriglyceridemia    Malignant melanoma of left side of neck ([place]) 10/25/2018   Myocardial infarction ([place])    Obesity    OSA (obstructive sleep apnea)    Persistent disorder of initiating or maintaining sleep    Personal history of colonic polyps 02/05/2003   Renal lesion 06/20/2016   Past Surgical History:  Procedure Laterality Date   BIOPSY  03/26/2022   Procedure: BIOPSY;  Surgeon: Gatha Mayer, MD;  Location: [facility] ENDOSCOPY;  Service: Gastroenterology;;   CHOLECYSTECTOMY     CORONARY ANGIOPLASTY     CORONARY STENT PLACEMENT     stenting of the right coronary artery with followup rotational  atherectomy. (3 stents placed)   ESOPHAGOGASTRODUODENOSCOPY (EGD) WITH PROPOFOL N/A 03/26/2022   Procedure: ESOPHAGOGASTRODUODENOSCOPY (EGD) WITH PROPOFOL;  Surgeon: Gatha Mayer, MD;  Location: [place];  Service: Gastroenterology;  Laterality: N/A;   ESOPHAGOGASTRODUODENOSCOPY (EGD) WITH PROPOFOL N/A 07/11/2022   Procedure: ESOPHAGOGASTRODUODENOSCOPY (EGD) WITH PROPOFOL;  Surgeon: Rush Landmark Telford Nab., MD;  Location: [facility] ENDOSCOPY;  Service:  Gastroenterology;  Laterality: N/A;   FINGER SURGERY     right   FOOT SURGERY     right   HEMOSTASIS CLIP PLACEMENT  07/11/2022   Procedure: HEMOSTASIS CLIP PLACEMENT;  Surgeon: Irving Copas., MD;  Location: [facility];  Service: Gastroenterology;;  ovesco    HOT HEMOSTASIS N/A 07/11/2022   Procedure: HOT HEMOSTASIS (ARGON PLASMA COAGULATION/BICAP);  Surgeon: Irving Copas., MD;  Location: [facility];  Service: Gastroenterology;  Laterality: N/A;   INGUINAL HERNIA REPAIR     right   IR GASTROSTOMY TUBE REMOVAL  02/10/2022   IRRIGATION AND DEBRIDEMENT ABSCESS N/A 06/04/2020   Procedure: IRRIGATION AND DEBRIDEMENT SACRAL WOUND;  Surgeon: Jesusita Oka, MD;  Location: [place];  Service: General;  Laterality: N/A;   LAPAROSCOPY N/A 06/04/2020   Procedure: LAPAROSCOPY ASSISTED COLOSTOMY, LAPAROSCOPY GASTROSTOMY;  Surgeon: Jesusita Oka, MD;  Location: [place];  Service: General;  Laterality: N/A;   LYSIS OF ADHESION N/A 06/04/2020   Procedure: LYSIS OF ADHESION;  Surgeon: Jesusita Oka, MD;  Location: [place];  Service: General;  Laterality: N/A;   melanoma removal     neck   ORTHOPEDIC SURGERY     foot right   rotator cuff surg     Bil   No current facility-administered medications for this encounter.   No current facility-administered medications for this encounter. Allergies  Allergen Reactions   Gabapentin Other (See Comments)    Unknown   Levofloxacin Rash   Penicillins Hives and Rash  Tolerated Unasyn, Augmentin, cefepime and cephalexin   Family History  Problem Relation Age of Onset   Lung cancer Mother    COPD Mother    Obesity Mother    Liver cancer Father    Lung cancer Father    Heart disease Father    Heart disease Sister    Colon cancer Neg Hx    Stomach cancer Neg Hx    Pancreatic cancer Neg Hx    Esophageal cancer Neg Hx    Inflammatory bowel disease Neg Hx    Liver disease Neg Hx    Rectal cancer Neg Hx    Social History    Socioeconomic History   Marital status: Married    Spouse name: Not on file   Number of children: 1   Years of education: Not on file   Highest education level: Not on file  Occupational History   Occupation: post-master    Comment: [employer]  Tobacco Use   Smoking status: Former    Packs/day: 1.50    Years: 30.00    Total pack years: 45.00    Types: Cigarettes    Quit date: 09/19/1980    Years since quitting: 42.0   Smokeless tobacco: Never  Vaping Use   Vaping Use: Never used  Substance and Sexual Activity   Alcohol use: Not Currently    Alcohol/week: 7.0 - 14.0 standard drinks of alcohol    Types: 7 - 14 Shots of liquor per week   Drug use: No   Sexual activity: Not Currently    Partners: Female  Other Topics Concern   Not on file  Social History Narrative   12/14/2018: Lives with wife and 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 (son had passed away several years ago)   Recevies much of his care through [place]   Enjoys golfing   Social Determinants of Health   Financial Resource Strain: Low Risk  (03/06/2020)   Overall Financial Resource Strain (CARDIA)    Difficulty of Paying Living Expenses: Not hard at all  Food Insecurity: No Food Insecurity (12/14/2018)   Hunger Vital Sign    Worried About Running Out of Food in the Last Year: Never true    Ran Out of Food in the Last Year: Never true  Transportation Needs: No Transportation Needs (03/06/2020)   PRAPARE - Hydrologist (Medical): No    Lack of Transportation (Non-Medical): No  Physical Activity: Inactive (12/14/2018)   Exercise Vital Sign    Days of Exercise per Week: 0 days    Minutes of Exercise per Session: 0 min  Stress: No Stress Concern Present (12/14/2018)   [place]    Feeling of Stress : Not at all  Social Connections: Unknown (12/14/2018)   Social Connection and Isolation Panel [NHANES]    Frequency of Communication with  Friends and Family: Once a week    Frequency of Social Gatherings with Friends and Family: Never    Attends Religious Services: Not on Advertising copywriter or Organizations: Not on file    Attends Archivist Meetings: Not on file    Marital Status: Married  Intimate Partner Violence: Not At Risk (12/14/2018)   Humiliation, Afraid, Rape, and Kick questionnaire    Fear of Current or Ex-Partner: No    Emotionally Abused: No    Physically Abused: No    Sexually Abused: No    Physical Exam: There  were no vitals filed for this visit. There is no height or weight on file to calculate BMI. GEN: NAD EYE: Sclerae anicteric ENT: MMM CV: Non-tachycardic GI: Soft, NT/ND NEURO:  Alert & Oriented x 3  Lab Results: No results for input(s): "WBC", "HGB", "HCT", "PLT" in the last 72 hours. BMET No results for input(s): "NA", "K", "CL", "CO2", "GLUCOSE", "BUN", "CREATININE", "CALCIUM" in the last 72 hours. LFT No results for input(s): "PROT", "ALBUMIN", "AST", "ALT", "ALKPHOS", "BILITOT", "BILIDIR", "IBILI" in the last 72 hours. PT/INR No results for input(s): "LABPROT", "INR" in the last 72 hours.   Impression / Plan: This is a 77 y.o.male who presents for EGD/EUS with repeat attempt at GCF closure and also possible attempt at Gastric NET removal.  The risks of an EUS including intestinal perforation, bleeding, infection, aspiration, and medication effects were discussed as was the possibility it may not give a definitive diagnosis if a biopsy is performed.  When a biopsy of the pancreas is done as part of the EUS, there is an additional risk of pancreatitis at the rate of about 1-2%.  It was explained that procedure related pancreatitis is typically mild, although it can be severe and even life threatening, which is why we do not perform random pancreatic biopsies and only biopsy a lesion/area we feel is concerning enough to warrant the risk.  The risks and benefits of  endoscopic evaluation/treatment were discussed with the patient and/or family; these include but are not limited to the risk of perforation, infection, bleeding, missed lesions, lack of diagnosis, severe illness requiring hospitalization, as well as anesthesia and sedation related illnesses.  The patient's history has been reviewed, patient examined, no change in status, and deemed stable for procedure.  The patient and/or family is agreeable to proceed.    Justice Britain, MD [facility] Advanced [facility]oscopy Office # [phone]

## 2022-10-13 NOTE — Anesthesia Procedure Notes (Signed)
Procedure Name: Intubation Date/Time: 10/13/2022 12:15 PM  Performed by: Gerald Leitz, CRNAPre-anesthesia Checklist: Patient identified, Patient being monitored, Timeout performed, Emergency Drugs available and Suction available Patient Re-evaluated:Patient Re-evaluated prior to induction Oxygen Delivery Method: Circle system utilized Preoxygenation: Pre-oxygenation with 100% oxygen Induction Type: IV induction Ventilation: Mask ventilation with difficulty, Two handed mask ventilation required and Oral airway inserted - appropriate to patient size Laryngoscope Size: Mac, 3 and Glidescope Grade View: Grade III Tube type: Oral Tube size: 7.5 mm Number of attempts: 2 Airway Equipment and Method: Stylet and Video-laryngoscopy Placement Confirmation: ETT inserted through vocal cords under direct vision, positive ETCO2 and breath sounds checked- equal and bilateral Secured at: 23 cm Tube secured with: Tape Dental Injury: Teeth and Oropharynx as per pre-operative assessment  Difficulty Due To: Difficulty was unanticipated, Difficult Airway- due to anterior larynx and Difficult Airway-  due to neck instability

## 2022-10-13 NOTE — Progress Notes (Signed)
[facility] to give report to nurse [place] price at 1410.

## 2022-10-13 NOTE — Op Note (Addendum)
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 Loletha Carrow, MD, Jesusita Oka, Dr. Keenan Bachelor Medicines:                Monitored Anesthesia Care Complications:            No immediate complications. Estimated Blood Loss:     Estimated blood loss was minimal. Procedure:                Pre-Anesthesia Assessment:                           - Prior to the procedure, a History and Physical                            was performed, and patient medications and                            allergies were reviewed. The patient's tolerance of                            previous anesthesia was also reviewed. The risks                            and benefits of the procedure and the sedation                            options and risks were discussed with the patient.                            All questions were answered, and informed consent                            was obtained. Prior Anticoagulants: The patient has                            taken no anticoagulant or antiplatelet agents  except for aspirin. ASA Grade Assessment: III - A                            patient with severe systemic disease. After                            reviewing the risks and benefits, the patient was                            deemed in satisfactory condition to undergo the                            procedure.                            After obtaining informed consent, the endoscope was                            passed under direct vision. Throughout the                            procedure, the patient's blood pressure, pulse, and                            oxygen saturations were monitored continuously. The                            [id number]-[id number] ([id number]) Olympus therapeutic endoscope                            was introduced through the mouth, and advanced to                            the second part of duodenum. The [id number]-[id number]                            ([id number]) Olympus radial ultrasound scope was                            introduced through the mouth, and advanced to the                            stomach for ultrasound examination from the                            esophagus and stomach. The upper EUS was                            accomplished without difficulty. The patient                            tolerated the procedure. Scope In: Scope Out: Findings:      ENDOSCOPIC FINDING: :      No gross lesions were noted in the entire esophagus.  The Z-line was irregular and was found 39 cm from the incisors.      A previously placed OVESCO clip was found on the anterior wall of the       stomach. The OVESCO clip was buried halfway into the gastric lining.       Using forceps I removed tissue to expose the other side of the clip.       Subsequently using the OVESCO reMOVE system we were able to cut the clip       and it fell off. It was removed using the OVESCO hood to safely decrease       risk of laceration of the esophagus.      After removing the OVESCO clip, A 3 mm fistula was found on the anterior       wall of the stomach (this is significantly improved from the prior       fistula appearance). Coagulation for tissue destruction using argon       plasma (gastric settings) was performed to coagulate the tissue in an       effort of trying to improve granulation. This was felt to be  successful.       To repair the persisting fistula, the tissue edges were approximated and       using out side pressure on the abdomen and significant suction to move       the fistula into place centrally, one 12 mm over-the-scope (GC) clip was       successfully placed (MR conditional). Closure of the defect was felt to       be successful. Clip manufacturer: [employer]. There was no       bleeding at the end of the procedure.      A single 9 mm semi-sessile polyp with no bleeding and no stigmata of       recent bleeding was found on the greater curvature of the gastric body.       After the EUS was completed, preparations were made for mucosal       resection. Demarcation of the lesion was performed with high-definition       white light and narrow band imaging to clearly identify the boundaries       of the lesion. EverLift was injected to raise the lesion. Piecemeal       mucosal resection using a snare was performed. Resection and retrieval       were complete. Resected tissue margins were examined and clear of polyp       tissue. To prevent bleeding after mucosal resection, three hemostatic       clips were successfully placed (MR conditional). Clip manufacturer:       [employer]. There was no bleeding at the end of the procedure.       Area adjacent was tattooed with an injection of Spot (carbon black).      Patchy mildly erythematous mucosa without bleeding was found in the       entire examined stomach; previously biopsied so not rebiopsied.      No gross lesions were noted in the duodenal bulb, in the first portion       of the duodenum and in the second portion of the duodenum.      ENDOSONOGRAPHIC FINDING: :      Endosonographic imaging in the entire stomach showed no intramural       (subepithelial) lesion.      No malignant-appearing  lymph nodes were visualized in the celiac region       (level 20), peripancreatic region and porta hepatis region.       Endosonographic imaging in the visualized portion of the liver showed no       mass.      The celiac region was visualized. Impression:               EGD impression:                           - No gross lesions in the entire esophagus. Z-line                            irregular, 39 cm from the incisors.                           - A previously placed OVESCO clip was found in the                            anterior stomach at the area of previous GC                            fistula. This was removed. The gastric fistula was                            exposed. This was treated with thermal therapy and                            subsequent replacement of another OVESCO GC clip                            with what was felt to be good effect.                           - A single gastric polyp in the gastric body                            (similar location to what was previously described)                            was found and subsequently resected and retrieved                            after EUS. Clips (MR conditional) were placed. Clip                            manufacturer: [employer]. Tattooed laterally.                           - Erythematous mucosa in the stomach; not                            rebiopsied.                           -  No gross lesions in the duodenal bulb, in the                            first portion of the duodenum and in the second                            portion of the duodenum.                           EUS impression:                           - Endosonographic imaging of the entire stomach                            showed no evidence of an intramural lesion.                           - No malignant-appearing lymph nodes were                            visualized in the celiac region (level 20),                            peripancreatic region and porta hepatis region. Moderate Sedation:      Not Applicable - Patient had care per  Anesthesia. Recommendation:           - The patient will be observed post-procedure,                            until all discharge criteria are met.                           - Discharge patient to a nursing home.                           - Patient has a contact number available for                            emergencies. The signs and symptoms of potential                            delayed complications were discussed with the                            patient. Return to normal activities tomorrow.                            Written discharge instructions were provided to the                            patient.                           - Full liquid diet for 36 hours and  then advance.                           - Observe patient's clinical course.                           - Await path results.                           - KUB in 4 weeks.                           - Increase to Omeprazole 40 mg twice daily to                            minimize acid and hopefully allow further healing.                           - Augmentin 875 mg twice daily for 3-days next dose                            on 1/26 AM.                           - Hopefully this will allow complete closure over                            the course of the next couple of months. If patient                            continues to have issues, then surgical                            intervention will need to be considered strongly                            for the gastrocutaneous fistula.                           - Repeat EGD/EUS to be determined based on                            pathology findings and next 6 to 12 months.                           - The findings and recommendations were discussed                            with the patient.                           - The findings and recommendations were discussed                            with the designated responsible adult. Procedure Code(s):        ---  Professional ---                           [phone], 64, Esophagogastroduodenoscopy, flexible,                            transoral; with endoscopic mucosal resection                           43270, Esophagogastroduodenoscopy, flexible,                            transoral; with ablation of tumor(s), polyp(s), or                            other lesion(s) (includes pre- and post-dilation                            and guide wire passage, when performed)                           43237, Esophagogastroduodenoscopy, flexible,                            transoral; with endoscopic ultrasound examination                            limited to the esophagus, stomach or duodenum, and                            adjacent structures                           43247, Esophagogastroduodenoscopy, flexible,                            transoral; with removal of foreign body(s) Diagnosis Code(s):        --- Professional ---                           K22.89, Other specified disease of esophagus                           T18.2XXA, Foreign body in stomach, initial encounter                           K31.6, Fistula of stomach and duodenum                           K31.7, Polyp of stomach and duodenum                           K31.89, Other diseases of stomach and duodenum                           I89.9, Noninfective disorder of lymphatic vessels  and lymph nodes, unspecified CPT copyright 2022 [employer]. All rights reserved. The codes documented in this report are preliminary and upon coder review may  be revised to meet current compliance requirements. Justice Britain, MD 10/13/2022 1:59:20 PM Number of Addenda: 0

## 2022-10-13 NOTE — Anesthesia Postprocedure Evaluation (Signed)
Anesthesia Post Note  Patient: Brent [name].  Procedure(s) Performed: ESOPHAGOGASTRODUODENOSCOPY (EGD) WITH PROPOFOL UPPER ENDOSCOPIC ULTRASOUND (EUS) RADIAL HOT HEMOSTASIS (ARGON PLASMA COAGULATION/BICAP) HEMOSTASIS CLIP PLACEMENT SUBMUCOSAL LIFTING INJECTION POLYPECTOMY SUBMUCOSAL TATTOO INJECTION ENDOSCOPIC MUCOSAL RESECTION     Patient location during evaluation: Endoscopy Anesthesia Type: General Level of consciousness: awake and alert Pain management: pain level controlled Vital Signs Assessment: post-procedure vital signs reviewed and stable Respiratory status: spontaneous breathing, nonlabored ventilation and respiratory function stable Cardiovascular status: blood pressure returned to baseline and stable Postop Assessment: no apparent nausea or vomiting Anesthetic complications: yes  Encounter Notable Events  Notable Event Outcome Phase Comment  Difficult to intubate - unexpected  Intraprocedure Filed from anesthesia note documentation.    Last Vitals:  Vitals:   10/13/22 1430 10/13/22 1435  BP: (!) 166/52   Pulse: 71 73  Resp: 16 16  Temp:    SpO2: 97% 95%    Last Pain:  Vitals:   10/13/22 1430  TempSrc:   PainSc: 0-No pain                 Jasson Siegmann,W. EDMOND

## 2022-10-13 NOTE — Discharge Instructions (Signed)
YOU HAD AN ENDOSCOPIC PROCEDURE TODAY: Refer to the procedure report and other information in the discharge instructions given to you for any specific questions about what was found during the examination. If this information does not answer your questions, please call [place] office at [phone] to clarify.  ° °YOU SHOULD EXPECT: Some feelings of bloating in the abdomen. Passage of more gas than usual. Walking can help get rid of the air that was put into your GI tract during the procedure and reduce the bloating. If you had a lower endoscopy (such as a colonoscopy or flexible sigmoidoscopy) you may notice spotting of blood in your stool or on the toilet paper. Some abdominal soreness may be present for a day or two, also. ° °DIET: Your first meal following the procedure should be a light meal and then it is ok to progress to your normal diet. A half-sandwich or bowl of soup is an example of a good first meal. Heavy or fried foods are harder to digest and may make you feel nauseous or bloated. Drink plenty of fluids but you should avoid alcoholic beverages for 24 hours. If you had a esophageal dilation, please see attached instructions for diet.   ° °ACTIVITY: Your care partner should take you home directly after the procedure. You should plan to take it easy, moving slowly for the rest of the day. You can resume normal activity the day after the procedure however YOU SHOULD NOT DRIVE, use power tools, machinery or perform tasks that involve climbing or major physical exertion for 24 hours (because of the sedation medicines used during the test).  ° °SYMPTOMS TO REPORT IMMEDIATELY: °A gastroenterologist can be reached at any hour. Please call [phone]  for any of the following symptoms:  °Following lower endoscopy (colonoscopy, flexible sigmoidoscopy) °Excessive amounts of blood in the stool  °Significant tenderness, worsening of abdominal pains  °Swelling of the abdomen that is new, acute  °Fever of 100° or  higher  °Following upper endoscopy (EGD, EUS, ERCP, esophageal dilation) °Vomiting of blood or coffee ground material  °New, significant abdominal pain  °New, significant chest pain or pain under the shoulder blades  °Painful or persistently difficult swallowing  °New shortness of breath  °Black, tarry-looking or red, bloody stools ° °FOLLOW UP:  °If any biopsies were taken you will be contacted by phone or by letter within the next 1-3 weeks. Call [phone]  if you have not heard about the biopsies in 3 weeks.  °Please also call with any specific questions about appointments or follow up tests. ° °

## 2022-10-13 NOTE — Anesthesia Preprocedure Evaluation (Addendum)
Anesthesia Evaluation  Patient identified by MRN, date of birth, ID band Patient awake    Reviewed: Allergy & Precautions, H&P , NPO status , Patient's Chart, lab work & pertinent test results  Airway Mallampati: III  TM Distance: >3 FB Neck ROM: Full    Dental no notable dental hx. (+) Teeth Intact, Dental Advisory Given   Pulmonary sleep apnea and Continuous Positive Airway Pressure Ventilation , former smoker   Pulmonary exam normal breath sounds clear to auscultation       Cardiovascular hypertension, Pt. on medications and Pt. on home beta blockers + CAD, + Past MI, + Cardiac Stents and + Peripheral Vascular Disease   Rhythm:Regular Rate:Normal     Neuro/Psych  Headaches   Depression     Neuromuscular disease    GI/Hepatic negative GI ROS, Neg liver ROS,,,  Endo/Other  diabetes, Insulin Dependent, Oral Hypoglycemic Agents    Renal/GU negative Renal ROS  negative genitourinary   Musculoskeletal   Abdominal   Peds  Hematology negative hematology ROS (+)   Anesthesia Other Findings   Reproductive/Obstetrics negative OB ROS                             Anesthesia Physical Anesthesia Plan  ASA: 3  Anesthesia Plan: General   Post-op Pain Management: Minimal or no pain anticipated   Induction: Intravenous  PONV Risk Score and Plan: 2 and Propofol infusion and Ondansetron  Airway Management Planned: Oral ETT  Additional Equipment:   Intra-op Plan:   Post-operative Plan: Extubation in OR  Informed Consent: I have reviewed the patients History and Physical, chart, labs and discussed the procedure including the risks, benefits and alternatives for the proposed anesthesia with the patient or authorized representative who has indicated his/her understanding and acceptance.     Dental advisory given  Plan Discussed with: CRNA  Anesthesia Plan Comments:        Anesthesia  Quick Evaluation

## 2022-10-14 ENCOUNTER — Encounter: Payer: Self-pay | Admitting: Gastroenterology

## 2022-10-14 ENCOUNTER — Encounter (HOSPITAL_COMMUNITY): Payer: Self-pay | Admitting: Gastroenterology

## 2022-10-14 LAB — SURGICAL PATHOLOGY

## 2022-11-17 ENCOUNTER — Telehealth: Payer: Self-pay

## 2022-11-17 NOTE — Telephone Encounter (Signed)
The pt aware to have xray and will do that as soon as he is able.

## 2022-11-17 NOTE — Telephone Encounter (Signed)
-----   Message from Timothy Lasso, RN sent at 10/17/2022 10:09 AM EST ----- KUB in 1 month

## 2022-11-25 ENCOUNTER — Ambulatory Visit (HOSPITAL_COMMUNITY)
Admission: RE | Admit: 2022-11-25 | Discharge: 2022-11-25 | Disposition: A | Discharge status: Home / Self Care | Payer: Medicare Other | Source: Ambulatory Visit | Attending: Gastroenterology | Admitting: Gastroenterology

## 2022-11-25 ENCOUNTER — Other Ambulatory Visit: Payer: Self-pay

## 2022-11-25 DIAGNOSIS — K316 Fistula of stomach and duodenum: Secondary | ICD-10-CM | POA: Diagnosis present

## 2022-11-30 NOTE — Progress Notes (Unsigned)
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 He had significant improvement after Dr. Donneta Romberg first treatment with endoscopic APC and over the scope clip placement, but there was still leakage through the site. On January 25, he underwent repeat EGD with Dr. Rush Landmark, at which time that clip was removed, further APC treatment was applied and a new clip placed at the gastrostomy opening. During that same exam, a polypoid lesion on the greater curve of the stomach was also examined by endoscopic ultrasound and then removed by EMR. KUB 11/25/2022 shows over the scope clip still in place in the stomach.  Brent [name] was also incidentally discovered to have a submucosal gastric nodule found on EGD summer 2023 during hospitalization for this nonhealing gastrocutaneous fistula. ______________________________  Brent [name] says the drainage from the fistula decreased somewhat after the last procedure but persists.  There is still a collection bag sitting over it which contains about 50 cc of stomach juice and food particles from this morning's breakfast.  He says the amount of drainage always increases when he is repositioned to sit forward or if he has abdominal muscle spasms due to his neurologic condition.  ROS: Cardiovascular:  no chest pain Respiratory: no dyspnea  The patient's Past Medical, Family and Social History were reviewed and are on file in the EMR.  Objective:  Med list reviewed  Current Outpatient Medications:    acetaminophen (TYLENOL) 500 MG tablet, Take 500 mg by mouth every 6 (six) hours as needed for moderate pain., Disp: , Rfl:    albuterol (PROVENTIL) (2.5 MG/3ML) 0.083% nebulizer solution, Take 3 mLs (2.5 mg total) by nebulization every 6 (six) hours as needed for wheezing or shortness of breath (or anaphylaxis due  to Rituxan infusion)., Disp: , Rfl:    Alogliptin Benzoate 25 MG TABS, Take 25 mg by mouth daily., Disp: , Rfl:    Amino Acids-Protein Hydrolys (FEEDING SUPPLEMENT, PRO-STAT 64,) LIQD, Take 30 mLs by mouth in the morning and at bedtime., Disp: , Rfl:    atenolol (TENORMIN) 25 MG tablet, Take 25 mg by mouth at bedtime., Disp: , Rfl:    atorvastatin (LIPITOR) 20 MG tablet, Take 1 tablet (20 mg total) by mouth daily. (Patient taking differently: Take 20 mg by mouth at bedtime.), Disp: , Rfl:    azelastine (OPTIVAR) 0.05 % ophthalmic solution, Place 1 drop into both eyes 2 (two) times daily as needed (allergies)., Disp: , Rfl:    baclofen (LIORESAL) 10 MG tablet, Take 10 mg by mouth 3 (three) times daily., Disp: , Rfl:    cephALEXin (KEFLEX) 250 MG capsule, Take 250 mg by mouth daily., Disp: , Rfl:    cholecalciferol (VITAMIN D3) 25 MCG (1000 UNIT) tablet, Take 3,000 Units by mouth daily., Disp: , Rfl:    famotidine (PEPCID) 20 MG tablet, Take 20 mg by mouth 2 (two) times daily., Disp: , Rfl:    fexofenadine (ALLEGRA) 180 MG tablet, Take 180 mg by mouth daily., Disp: , Rfl:    folic acid (FOLVITE) A999333 MCG tablet, Take 400 mcg by mouth daily., Disp: , Rfl:    gentamicin cream (GARAMYCIN) 0.1 %, Apply 1 Application topically daily. Apply dime size thickness to Sacrum and ulcer, Right Ischium ulcer, Disp: , Rfl:    insulin aspart (NOVOLOG) 100 UNIT/ML injection, Inject 0-15 Units into the skin every 4 (four) hours. Sliding  scale  CBG 70 - 120: 0 units: CBG 121 - 150: 2 units; CBG 151 - 200: 3 units; CBG 201 - 250: 5 units; CBG 251 - 300: 8 units;CBG 301 - 350: 11 units; CBG 351 - 400: 15 units; CBG > 400 : 15 units and notify MD (Patient taking differently: Inject 18 Units into the skin 3 (three) times daily before meals.), Disp: 10 mL, Rfl: 11   insulin glargine (LANTUS) 100 unit/mL SOPN, Inject 15 Units into the skin at bedtime. (Patient taking differently: Inject 72 Units into the skin daily.), Disp: 15  mL, Rfl: 11   losartan (COZAAR) 50 MG tablet, Take 50 mg by mouth daily., Disp: , Rfl:    magnesium hydroxide (MILK OF MAGNESIA) 400 MG/5ML suspension, Take 30 mLs by mouth daily as needed for mild constipation., Disp: , Rfl:    Magnesium Oxide, Elemental, 400 MG TABS, Take 400 mg by mouth 3 (three) times daily., Disp: , Rfl:    melatonin 3 MG TABS tablet, Take 3 mg by mouth at bedtime. Apply to peri-wound bed to Sacrum and right ischium with daily routine dressing change, Disp: , Rfl:    Menthol, Topical Analgesic, (BIOFREEZE) 4 % GEL, Apply 1 application  topically 2 (two) times daily as needed (Pain to back upper and lower arms and shoulders)., Disp: , Rfl:    metFORMIN (GLUCOPHAGE) 500 MG tablet, Take 500 mg by mouth 2 (two) times daily with a meal., Disp: , Rfl:    nystatin powder, Apply 1 Application topically daily., Disp: , Rfl:    omeprazole (PRILOSEC) 40 MG capsule, Take 1 capsule (40 mg total) by mouth 2 (two) times daily. 30 minutes before breakfast/dinner, Disp: 30 capsule, Rfl: 12   ondansetron (ZOFRAN) 4 MG tablet, Take 4 mg by mouth every 8 (eight) hours as needed for nausea., Disp: , Rfl:    phenol (CHLORASEPTIC) 1.4 % LIQD, Use as directed 2 sprays in the mouth or throat every 4 (four) hours as needed for throat irritation / pain., Disp: , Rfl:    Polyethyl Glycol-Propyl Glycol (GENTEAL TEARS SEVERE DAY/NIGHT) 0.4-0.3 % GEL ophthalmic gel, Place 1 Application into both eyes 4 (four) times daily as needed (Dry Eyes)., Disp: , Rfl:    PSYLLIUM PO, Take 0.8 g by mouth in the morning and at bedtime., Disp: , Rfl:    senna-docusate (SENOKOT-S) 8.6-50 MG tablet, Take 1 tablet by mouth 2 (two) times daily., Disp: , Rfl:    sertraline (ZOLOFT) 25 MG tablet, Take 25 mg by mouth daily., Disp: , Rfl:    spironolactone (ALDACTONE) 25 MG tablet, Take 25 mg by mouth daily., Disp: , Rfl:    traMADol (ULTRAM) 50 MG tablet, Take 50 mg by mouth every 6 (six) hours as needed (Pain)., Disp: , Rfl:     traZODone (DESYREL) 50 MG tablet, Take 75 mg by mouth at bedtime., Disp: , Rfl:    vitamin B-12 (CYANOCOBALAMIN) 1000 MCG tablet, Take 1,000 mcg by mouth daily., Disp: , Rfl:    vitamin C (ASCORBIC ACID) 500 MG tablet, Take 500 mg by mouth daily., Disp: , Rfl:   I confirmed on his MAR that he is still receiving omeprazole twice daily   Vital signs in last 24 hrs: Vitals:   12/01/22 0921  BP: (!) 122/58  Pulse: 60   Wt Readings from Last 3 Encounters:  10/13/22 215 lb (97.5 kg)  07/11/22 205 lb (93 kg)  05/26/22 199 lb (90.3 kg)    Physical  Exam  Wheelchair-bound, alert and conversational Abdomen: soft,, no tenderness, with active bowel sounds. No guarding or palpable hepatosplenomegaly.  Upper abdominal gastrocutaneous fistula leaking fluid and food particles into collection bag as noted above Skin; warm and dry, no jaundice or rash  Labs:   ___________________________________________ Radiologic studies:  CLINICAL DATA:  Evaluate for gastro cutaneous fistula   EXAM: ABDOMEN - 2 VIEW   COMPARISON:  KUB August 30, 2022. CT the abdomen pelvis March 25, 2022.   FINDINGS: Lung bases are normal. No free air, portal venous gas, or pneumatosis. Surgical clips are identified in the abdomen. Milk of calcium again identified in the left kidney. A calcification in the left pelvis is stable. Mild fecal loading in the colon. No bowel obstruction. Degenerative changes in the hips. Degenerative changes in the lumbar spine. No other acute abnormalities.   IMPRESSION: 1. Mild fecal loading in the colon. 2. No other acute abnormalities. 3. If there is concern for a gastrocutaneous fistula, recommend CT imaging.     Electronically Signed   By: Dorise Bullion III M.D.   On: 11/26/2022 10:50    KUB images personally reviewed - H. Danis ____________________________________________ Other: Pathology of gastric lesion:  A. STOMACH, NODULE, BIOPSY: Well-differentiated  neuroendocrine tumor (G1), clinically recurrent. Extensive intestinal metaplasia. See comment.  COMMENT: There are multiple fragments of glandular mucosa with focal features consistent with gastric mucosa with extensive intestinal metaplasia. Several biopsy fragments are involved by an epithelioid neoplasm characterized by nests of cells with uniform nuclei without necrosis, significant atypia or mitotic activity.  The findings are consistent with the clinical history of well-differentiated neuroendocrine tumor (G1, carcinoid).  Dr. Alric Seton reviewed this case and agrees.   GROSS DESCRIPTION: Received in formalin are tan, soft tissue fragments that are submitted in toto. Number: Multiple size: 0.1 to 0.2 cm blocks: 1 (KW, 10/13/2022)  Final Diagnosis performed by Claudette Laws, MD.   Electronically signed 10/14/2022  _____________________________________________ Assessment & Plan  Assessment: Encounter Diagnoses  Name Primary?   Neuroendocrine neoplasm of stomach Yes   Gastrocutaneous fistula    Unfortunately, he has persistent drainage from a gastrocutaneous fistula after removal of his gastrostomy tube. We know he does not have gastric outlet obstruction from previous endoscopic procedures. Not for lack of significant effort on Dr. Donneta Romberg part, but endoscopic efforts have not been able to solve this problem thus far.  He needs reconsideration of a surgical or interventional radiology solution to occlude the tract.  Plan: I will open a lot of communication with Dr. Rush Landmark, Dr. Dwaine Gale of IR (see phone notes 03/25/2022 for his prior involvement) and Dr. Bobbye Morton of surgery (who originally placed the G-tube)  I reassured him that he had a low-grade neuroendocrine lesion completely removed from the stomach.  22 minutes were spent on this encounter (including chart review, history/exam, counseling/coordination of care, and documentation) > 50% of that time was spent on  counseling and coordination of care.   Nelida Meuse III

## 2022-12-01 ENCOUNTER — Ambulatory Visit (INDEPENDENT_AMBULATORY_CARE_PROVIDER_SITE_OTHER): Payer: Medicare Other | Admitting: Gastroenterology

## 2022-12-01 ENCOUNTER — Encounter: Payer: Self-pay | Admitting: Gastroenterology

## 2022-12-01 ENCOUNTER — Other Ambulatory Visit: Payer: Self-pay | Admitting: Interventional Radiology

## 2022-12-01 VITALS — BP 122/58 | HR 60 | Ht 72.0 in

## 2022-12-01 DIAGNOSIS — K316 Fistula of stomach and duodenum: Secondary | ICD-10-CM | POA: Diagnosis not present

## 2022-12-01 DIAGNOSIS — D3A8 Other benign neuroendocrine tumors: Secondary | ICD-10-CM

## 2022-12-01 NOTE — Patient Instructions (Signed)
_______________________________________________________  If your blood pressure at your visit was 140/90 or greater, please contact your primary care physician to follow up on this.  _______________________________________________________  If you are age 79 or older, your body mass index should be between 23-30. Your Body mass index is 29.16 kg/m. If this is out of the aforementioned range listed, please consider follow up with your Primary Care Provider.  If you are age 82 or younger, your body mass index should be between 19-25. Your Body mass index is 29.16 kg/m. If this is out of the aformentioned range listed, please consider follow up with your Primary Care Provider.   ________________________________________________________  The [facility] providers would like to encourage you to use [facility] to communicate with providers for non-urgent requests or questions.  Due to long hold times on the telephone, sending your provider a message by [facility] may be a faster and more efficient way to get a response.  Please allow 48 business hours for a response.  Please remember that this is for non-urgent requests.  _______________________________________________________  It was a pleasure to see you today!  Thank you for trusting me with your gastrointestinal care!

## 2022-12-05 ENCOUNTER — Ambulatory Visit
Admission: RE | Admit: 2022-12-05 | Discharge: 2022-12-05 | Disposition: A | Discharge status: Home / Self Care | Payer: Medicare Other | Source: Ambulatory Visit | Attending: Interventional Radiology | Admitting: Interventional Radiology

## 2022-12-05 ENCOUNTER — Other Ambulatory Visit: Payer: Self-pay | Admitting: Interventional Radiology

## 2022-12-05 DIAGNOSIS — K316 Fistula of stomach and duodenum: Secondary | ICD-10-CM

## 2022-12-08 ENCOUNTER — Ambulatory Visit (HOSPITAL_COMMUNITY)
Admission: RE | Admit: 2022-12-08 | Discharge: 2022-12-08 | Disposition: A | Discharge status: Home / Self Care | Payer: Medicare Other | Source: Ambulatory Visit | Attending: Interventional Radiology | Admitting: Interventional Radiology

## 2022-12-08 DIAGNOSIS — K316 Fistula of stomach and duodenum: Secondary | ICD-10-CM | POA: Insufficient documentation

## 2022-12-08 DIAGNOSIS — Z794 Long term (current) use of insulin: Secondary | ICD-10-CM | POA: Insufficient documentation

## 2022-12-08 DIAGNOSIS — E1165 Type 2 diabetes mellitus with hyperglycemia: Secondary | ICD-10-CM | POA: Diagnosis present

## 2022-12-08 LAB — POCT I-STAT CREATININE: Creatinine, Ser: 0.7 mg/dL (ref 0.61–1.24)

## 2022-12-08 MED ORDER — SODIUM CHLORIDE (PF) 0.9 % IJ SOLN
INTRAMUSCULAR | Status: AC
  Filled 2022-12-08: qty 50

## 2022-12-08 MED ORDER — IOHEXOL 300 MG/ML  SOLN
100.0000 mL | Freq: Once | INTRAMUSCULAR | Status: AC | PRN
  Administered 2022-12-08: 100 mL via INTRAVENOUS

## 2022-12-13 ENCOUNTER — Other Ambulatory Visit (HOSPITAL_COMMUNITY): Payer: Self-pay | Admitting: Interventional Radiology

## 2022-12-13 DIAGNOSIS — L988 Other specified disorders of the skin and subcutaneous tissue: Secondary | ICD-10-CM

## 2022-12-22 ENCOUNTER — Other Ambulatory Visit (HOSPITAL_COMMUNITY): Payer: Self-pay | Admitting: Interventional Radiology

## 2022-12-22 ENCOUNTER — Ambulatory Visit (HOSPITAL_COMMUNITY)
Admission: RE | Admit: 2022-12-22 | Discharge: 2022-12-22 | Disposition: A | Discharge status: Home / Self Care | Payer: Medicare Other | Source: Ambulatory Visit | Attending: Interventional Radiology | Admitting: Interventional Radiology

## 2022-12-22 DIAGNOSIS — L988 Other specified disorders of the skin and subcutaneous tissue: Secondary | ICD-10-CM

## 2022-12-22 DIAGNOSIS — K316 Fistula of stomach and duodenum: Secondary | ICD-10-CM | POA: Insufficient documentation

## 2022-12-22 DIAGNOSIS — Z431 Encounter for attention to gastrostomy: Secondary | ICD-10-CM | POA: Diagnosis present

## 2022-12-22 HISTORY — PX: IR REPLC GASTRO/COLONIC TUBE PERCUT W/FLUORO: IMG2333

## 2022-12-22 MED ORDER — LIDOCAINE VISCOUS HCL 2 % MT SOLN
15.0000 mL | Freq: Once | OROMUCOSAL | Status: AC
  Administered 2022-12-22: 10 mL via ORAL

## 2022-12-22 MED ORDER — IOHEXOL 300 MG/ML  SOLN
50.0000 mL | Freq: Once | INTRAMUSCULAR | Status: AC | PRN
  Administered 2022-12-22: 15 mL

## 2022-12-22 MED ORDER — LIDOCAINE VISCOUS HCL 2 % MT SOLN
OROMUCOSAL | Status: AC
  Filled 2022-12-22: qty 15

## 2022-12-22 NOTE — Procedures (Signed)
Vascular and Interventional Radiology Procedure Note  Patient: Brent [name]. DOB: 14-May-1946 Medical Record Number: [id number] Note Date/Time: 12/22/22 2:18 PM   Performing Physician: Michaelle Birks, MD Assistant(s): None  Diagnosis: Persistent gastrocutaneous fistula  Procedure:  SINOGRAM and FISTULA EVALUATION  GASTROSTOMY TUBE REPLACEMENT  Anesthesia: Local Anesthetic Complications: None Estimated Blood Loss:  0 mL Specimens: None  Findings:  There is a persistent gastrocutaneous fistula, easily accessed percutaneously Stoma length 2.5 cm. A 16 F balloon-retention tube was reinserted into the tract.  Plan:  will proceed to order Biodesign EFP plug for attempt at percutaneous fistula closure  See detailed procedure note with images in PACS. The patient tolerated the procedure well without incident or complication and was returned to Recovery in stable condition.    Michaelle Birks, MD [facility]ascular [facility] Interventional Radiology [facility]   Pager. [place]

## 2023-01-23 ENCOUNTER — Other Ambulatory Visit (HOSPITAL_COMMUNITY): Payer: Self-pay | Admitting: Interventional Radiology

## 2023-01-23 DIAGNOSIS — R627 Adult failure to thrive: Secondary | ICD-10-CM

## 2023-01-24 ENCOUNTER — Ambulatory Visit (HOSPITAL_COMMUNITY): Payer: Medicare Other

## 2023-01-25 ENCOUNTER — Ambulatory Visit (HOSPITAL_COMMUNITY): Payer: Medicare Other

## 2023-01-25 ENCOUNTER — Other Ambulatory Visit (HOSPITAL_COMMUNITY): Payer: Self-pay | Admitting: Interventional Radiology

## 2023-01-25 DIAGNOSIS — K316 Fistula of stomach and duodenum: Secondary | ICD-10-CM

## 2023-01-25 DIAGNOSIS — R627 Adult failure to thrive: Secondary | ICD-10-CM

## 2023-01-26 ENCOUNTER — Other Ambulatory Visit (HOSPITAL_COMMUNITY): Payer: Self-pay | Admitting: Interventional Radiology

## 2023-01-26 ENCOUNTER — Ambulatory Visit (HOSPITAL_COMMUNITY)
Admission: RE | Admit: 2023-01-26 | Discharge: 2023-01-26 | Disposition: A | Discharge status: Home / Self Care | Payer: Medicare Other | Source: Ambulatory Visit | Attending: Interventional Radiology | Admitting: Interventional Radiology

## 2023-01-26 ENCOUNTER — Ambulatory Visit (HOSPITAL_COMMUNITY): Payer: Medicare Other

## 2023-01-26 DIAGNOSIS — R627 Adult failure to thrive: Secondary | ICD-10-CM | POA: Diagnosis not present

## 2023-01-26 DIAGNOSIS — Z431 Encounter for attention to gastrostomy: Secondary | ICD-10-CM | POA: Diagnosis present

## 2023-01-26 HISTORY — PX: IR REPLC GASTRO/COLONIC TUBE PERCUT W/FLUORO: IMG2333

## 2023-01-26 MED ORDER — IOHEXOL 300 MG/ML  SOLN
50.0000 mL | Freq: Once | INTRAMUSCULAR | Status: AC | PRN
  Administered 2023-01-26: 20 mL

## 2023-01-26 MED ORDER — LIDOCAINE VISCOUS HCL 2 % MT SOLN
OROMUCOSAL | Status: AC
  Filled 2023-01-26: qty 15

## 2023-01-31 ENCOUNTER — Ambulatory Visit (HOSPITAL_COMMUNITY): Payer: Medicare Other

## 2023-02-01 ENCOUNTER — Telehealth: Payer: Self-pay | Admitting: Gastroenterology

## 2023-02-01 NOTE — Telephone Encounter (Signed)
[facility] called on patient behalf stating he is having non mucus stool. States patient has a colostomy and is not suppose to be having stool from anus. Requesting a call back to be advised if patient should be scheduled for an appointment. Please advise, thank you.

## 2023-02-02 NOTE — Telephone Encounter (Signed)
[facility]. Spoke with Luisa Hart, RN. I informed Luisa Hart, RN of the information below. He will notate this information in the patient's chart. Luisa Hart, RN had no questions or concerns at the end of the call.

## 2023-02-02 NOTE — Telephone Encounter (Signed)
Patient's often have small amounts of retained stool in the rectosigmoid colon that was present before their colostomy.  So it is common for some to periodically pass per rectum.  We know he does not have anything like a small bowel to rectal fistula since he had a CT abdomen 3 months ago.   No need for office visit.  - HD

## 2023-02-07 ENCOUNTER — Telehealth: Payer: Self-pay | Admitting: *Deleted

## 2023-02-07 ENCOUNTER — Encounter: Payer: Self-pay | Admitting: Neurology

## 2023-02-07 ENCOUNTER — Ambulatory Visit (INDEPENDENT_AMBULATORY_CARE_PROVIDER_SITE_OTHER): Payer: Medicare Other | Admitting: Neurology

## 2023-02-07 DIAGNOSIS — K592 Neurogenic bowel, not elsewhere classified: Secondary | ICD-10-CM

## 2023-02-07 DIAGNOSIS — N319 Neuromuscular dysfunction of bladder, unspecified: Secondary | ICD-10-CM

## 2023-02-07 DIAGNOSIS — Z79899 Other long term (current) drug therapy: Secondary | ICD-10-CM

## 2023-02-07 DIAGNOSIS — G8250 Quadriplegia, unspecified: Secondary | ICD-10-CM

## 2023-02-07 DIAGNOSIS — G360 Neuromyelitis optica [Devic]: Secondary | ICD-10-CM

## 2023-02-07 NOTE — Progress Notes (Signed)
Brent [name]. [birth year]  02/07/23    History of Present Illness: Brent [name] continues on Rituxan for his at the [facility].  He tolerates the infusions well.  He denies any recent infections but had an sacral decubitus shortly after his diagnosis..  Few UTIs since he has a suprapubic catheter.  Late last year, we checked IgG and IgM and the values were fine.  The NMO antibody was back in the normal range December 2022  His neurologic status has been stable.  He has moderate to severe weakness in the hands and the mild to moderate left arm weakness and no strength in the legs.  He feels the left arm is slightly worse than it was last year.    He is reporting more dysesthesias and continues to note numbness/tingling in the left hand,.  This bothers him most at night..  This is mostly in the third and fourth fingers.  He has a neurogenic bladder and now has a suprapubic catheter.  He has no visual problems.   For the bowel, he has has an ileostomy.     He has not had optic neuritis and vision is stable.  [facility] HIstory: On March 08, 2020, while sitting at church, he started to have intense pain in his chest.  He was concerned about an MI and drove to his nearby house.   He continued to feel poorly and his wife called an ambulance.   He went to [facility] ED.and EKG was fine.  While in the ED, he started to experience numbness first in his right leg then left leg and then in the hands.     MRIs of the spine were initially felt to be non-contributary.   Initially a spinal stroke was suspected.   For one day, he had SOB and went to the ICU.   Anti-NMO Abs returned positive.   He received 5 day course of IV Solumedrol and 5 days IVIg.    Rituxan was infused 03/24/2020 and again 2 weeks later. He went to the Rehab floor and started to have some improvement.  Upon discharge, he went to skilled nursing facility.  Rituxan was continued    Past Medical History:  Diagnosis Date   Allergy    Benign  neuroendocrine tumor of stomach 04/04/2022   CAD (coronary artery disease)    a. angioplasty of his RCA in 1990. b. bare metal stent placed in the RCA in 2000 followed by rotational atherectomy shortly after for stent restenosis. c. last cath was in 2012 showing stable moderate diffuse CAD. (70% mid LAD, 80% diagonal, 70% Ramus, 40% mid to distal RCA stent restenosis). d. Low risk nuc in 2015.   Diabetes mellitus    Diverticulosis    Elevated CK    Erectile dysfunction    Hemorrhoids    HTN (hypertension)    Hyperlipidemia    Hypertriglyceridemia    Malignant melanoma of left side of neck (HCC) 10/25/2018   Myocardial infarction (HCC)    Obesity    OSA (obstructive sleep apnea)    Persistent disorder of initiating or maintaining sleep    Personal history of colonic polyps 02/05/2003   Renal lesion 06/20/2016     Past Surgical History:  Procedure Laterality Date   BIOPSY  03/26/2022   Procedure: BIOPSY;  Surgeon: Iva Boop, MD;  Location: [facility] ENDOSCOPY;  Service: Gastroenterology;;   CHOLECYSTECTOMY     CORONARY ANGIOPLASTY     CORONARY STENT PLACEMENT  stenting of the right coronary artery with followup rotational  atherectomy. (3 stents placed)   ENDOSCOPIC MUCOSAL RESECTION  10/13/2022   Procedure: ENDOSCOPIC MUCOSAL RESECTION;  Surgeon: Meridee Score Netty Starring., MD;  Location: [facility] ENDOSCOPY;  Service: Gastroenterology;;   ESOPHAGOGASTRODUODENOSCOPY (EGD) WITH PROPOFOL N/A 03/26/2022   Procedure: ESOPHAGOGASTRODUODENOSCOPY (EGD) WITH PROPOFOL;  Surgeon: Iva Boop, MD;  Location: [facility] ENDOSCOPY;  Service: Gastroenterology;  Laterality: N/A;   ESOPHAGOGASTRODUODENOSCOPY (EGD) WITH PROPOFOL N/A 07/11/2022   Procedure: ESOPHAGOGASTRODUODENOSCOPY (EGD) WITH PROPOFOL;  Surgeon: Meridee Score Netty Starring., MD;  Location: [facility] ENDOSCOPY;  Service: Gastroenterology;  Laterality: N/A;   ESOPHAGOGASTRODUODENOSCOPY (EGD) WITH PROPOFOL N/A 10/13/2022   Procedure: ESOPHAGOGASTRODUODENOSCOPY  (EGD) WITH PROPOFOL;  Surgeon: Meridee Score Netty Starring., MD;  Location: [facility] ENDOSCOPY;  Service: Gastroenterology;  Laterality: N/A;   EUS N/A 10/13/2022   Procedure: UPPER ENDOSCOPIC ULTRASOUND (EUS) RADIAL;  Surgeon: Lemar Lofty., MD;  Location: [facility] ENDOSCOPY;  Service: Gastroenterology;  Laterality: N/A;   FINGER SURGERY     right   FOOT SURGERY     right   HEMOSTASIS CLIP PLACEMENT  07/11/2022   Procedure: HEMOSTASIS CLIP PLACEMENT;  Surgeon: Meridee Score Netty Starring., MD;  Location: [facility] ENDOSCOPY;  Service: Gastroenterology;;  ovesco    HEMOSTASIS CLIP PLACEMENT  10/13/2022   Procedure: HEMOSTASIS CLIP PLACEMENT;  Surgeon: Lemar Lofty., MD;  Location: [facility] ENDOSCOPY;  Service: Gastroenterology;;   HOT HEMOSTASIS N/A 07/11/2022   Procedure: HOT HEMOSTASIS (ARGON PLASMA COAGULATION/BICAP);  Surgeon: Lemar Lofty., MD;  Location: [facility] ENDOSCOPY;  Service: Gastroenterology;  Laterality: N/A;   HOT HEMOSTASIS N/A 10/13/2022   Procedure: HOT HEMOSTASIS (ARGON PLASMA COAGULATION/BICAP);  Surgeon: Lemar Lofty., MD;  Location: [facility] ENDOSCOPY;  Service: Gastroenterology;  Laterality: N/A;   INGUINAL HERNIA REPAIR     right   IR GASTROSTOMY TUBE REMOVAL  02/10/2022   IR REPLC GASTRO/COLONIC TUBE PERCUT W/FLUORO  12/22/2022   IR REPLC GASTRO/COLONIC TUBE PERCUT W/FLUORO  01/26/2023   IRRIGATION AND DEBRIDEMENT ABSCESS N/A 06/04/2020   Procedure: IRRIGATION AND DEBRIDEMENT SACRAL WOUND;  Surgeon: Diamantina Monks, MD;  Location: [facility] OR;  Service: General;  Laterality: N/A;   LAPAROSCOPY N/A 06/04/2020   Procedure: LAPAROSCOPY ASSISTED COLOSTOMY, LAPAROSCOPY GASTROSTOMY;  Surgeon: Diamantina Monks, MD;  Location: [facility] OR;  Service: General;  Laterality: N/A;   LYSIS OF ADHESION N/A 06/04/2020   Procedure: LYSIS OF ADHESION;  Surgeon: Diamantina Monks, MD;  Location: [facility] OR;  Service: General;  Laterality: N/A;   melanoma removal     neck   ORTHOPEDIC SURGERY     foot right    POLYPECTOMY  10/13/2022   Procedure: POLYPECTOMY;  Surgeon: Lemar Lofty., MD;  Location: [facility] ENDOSCOPY;  Service: Gastroenterology;;   rotator cuff surg     Bil   STENT REMOVAL  10/13/2022   Procedure: CLIP REMOVAL;  Surgeon: Lemar Lofty., MD;  Location: [facility] ENDOSCOPY;  Service: Gastroenterology;;   SUBMUCOSAL LIFTING INJECTION  10/13/2022   Procedure: SUBMUCOSAL LIFTING INJECTION;  Surgeon: Lemar Lofty., MD;  Location: [facility];  Service: Gastroenterology;;   SUBMUCOSAL TATTOO INJECTION  10/13/2022   Procedure: SUBMUCOSAL TATTOO INJECTION;  Surgeon: Lemar Lofty., MD;  Location: [facility] ENDOSCOPY;  Service: Gastroenterology;;   Family History  Problem Relation Age of Onset   Lung cancer Mother    COPD Mother    Obesity Mother    Liver cancer Father    Lung cancer Father    Heart disease Father    Heart disease  Sister    Colon cancer Neg Hx    Stomach cancer Neg Hx    Pancreatic cancer Neg Hx    Esophageal cancer Neg Hx    Inflammatory bowel disease Neg Hx    Liver disease Neg Hx    Rectal cancer Neg Hx      PHYSICAL There were no vitals filed for this visit.    General: The patient is well-developed and well-nourished and in no acute distress   HEENT:  Head is [place]/AT.  Sclera are anicteric.  Symmetric color vision    Skin: Extremities show mild edema at the ankles.  No rashes.   Neurologic Exam   Mental status: The patient is alert and oriented x 3 at the time of the examination. The patient has apparent normal recent and remote memory, with an apparently normal attention span and concentration ability.   Speech is normal.   Cranial nerves: Extraocular movements are full. Pupils are equal, round, and reactive to light and accomodation.   Color vision was symmetric.  Facial strength and sensation was normal.   No dysarthria is noted.  No obvious hearing deficits are noted.   Motor:  Muscle bulk is normal.   In the right arm, strength  was 5/5 in the deltoid and biceps, 4/5 in the triceps, 3/5 in finger flexors and extensors and 1 in the intrinsic hand muscles.  In the left arm, strength was 4+/5 in the biceps, 3/5 in the triceps, 2/5 in finger flexors and 0-1 in finger extensors and intrinsic hand muscles.  Strength was 0/5 in both legs.   Sensory: In the legs, he has reduced sensation to temperature but more normal sensation to touch.  He has fairly normal sensation to vibration.   Coordination: Cerebellar testing reveals mildly reduced finger-nose-finger.  He could not do heel-to-shin.   Gait and station: He is unable to stand   Reflexes: Deep tendon reflexes are 3+ at the knees (crossed adductors).  No ankle clonus.  s..    Assessment and Plan: 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HCC)  High risk medication use  Quadriplegia (HCC)  Neurogenic bowel  Neurogenic bladder   He has not had any further exacerbation since starting Rituxan.  Continue with rituximab 1000 mg x 2 separated by 2 weeks every 6 months labs have been stable.   Since the  dysesthesias or worsening, I will add lamotrigine and titrate up to 50 mg twice daily, further depending on response.   return in 6 months or sooner if there are new or worsening neurologic symptoms.  Lynne Takemoto A. Epimenio Foot, MD, PhD, FAAN Certified in Neurology, Clinical Neurophysiology, Sleep Medicine, Pain Medicine and Neuroimaging Director, [facility] at [facility]  [facility] [address] [place], [place] [address] [phone]

## 2023-02-07 NOTE — Telephone Encounter (Signed)
Pt here for infusion w/ intrafusion today. Dr. Epimenio Foot spoke w/ pt. He was added to schedule per MD request. Also scheduled f/u for 05/08/23 at 1:30pm per MD/intrafusion request. He required another visit prior to next infusion in October for insurance approval process.   Wrote appt down for pt prior to him checking out.

## 2023-02-19 ENCOUNTER — Other Ambulatory Visit: Payer: Self-pay | Admitting: Radiology

## 2023-02-19 DIAGNOSIS — K632 Fistula of intestine: Secondary | ICD-10-CM

## 2023-02-20 ENCOUNTER — Encounter (HOSPITAL_COMMUNITY): Payer: Self-pay

## 2023-02-20 ENCOUNTER — Inpatient Hospital Stay (HOSPITAL_COMMUNITY)
Admission: EM | Admit: 2023-02-20 | Discharge: 2023-02-25 | DRG: 659 | Disposition: A | Discharge status: Skilled Nursing Facility | Payer: Medicare Other | Source: Skilled Nursing Facility | Attending: Internal Medicine | Admitting: Internal Medicine

## 2023-02-20 ENCOUNTER — Other Ambulatory Visit: Payer: Self-pay

## 2023-02-20 DIAGNOSIS — Z6829 Body mass index (BMI) 29.0-29.9, adult: Secondary | ICD-10-CM

## 2023-02-20 DIAGNOSIS — N390 Urinary tract infection, site not specified: Secondary | ICD-10-CM | POA: Diagnosis present

## 2023-02-20 DIAGNOSIS — T83518A Infection and inflammatory reaction due to other urinary catheter, initial encounter: Secondary | ICD-10-CM | POA: Diagnosis not present

## 2023-02-20 DIAGNOSIS — K316 Fistula of stomach and duodenum: Secondary | ICD-10-CM | POA: Diagnosis present

## 2023-02-20 DIAGNOSIS — Z800 Family history of malignant neoplasm of digestive organs: Secondary | ICD-10-CM

## 2023-02-20 DIAGNOSIS — Y738 Miscellaneous gastroenterology and urology devices associated with adverse incidents, not elsewhere classified: Secondary | ICD-10-CM | POA: Diagnosis present

## 2023-02-20 DIAGNOSIS — Z801 Family history of malignant neoplasm of trachea, bronchus and lung: Secondary | ICD-10-CM

## 2023-02-20 DIAGNOSIS — I2510 Atherosclerotic heart disease of native coronary artery without angina pectoris: Secondary | ICD-10-CM | POA: Diagnosis present

## 2023-02-20 DIAGNOSIS — I10 Essential (primary) hypertension: Secondary | ICD-10-CM | POA: Diagnosis present

## 2023-02-20 DIAGNOSIS — E860 Dehydration: Secondary | ICD-10-CM | POA: Diagnosis present

## 2023-02-20 DIAGNOSIS — E669 Obesity, unspecified: Secondary | ICD-10-CM | POA: Diagnosis present

## 2023-02-20 DIAGNOSIS — G4733 Obstructive sleep apnea (adult) (pediatric): Secondary | ICD-10-CM | POA: Diagnosis present

## 2023-02-20 DIAGNOSIS — E872 Acidosis, unspecified: Secondary | ICD-10-CM | POA: Diagnosis present

## 2023-02-20 DIAGNOSIS — E781 Pure hyperglyceridemia: Secondary | ICD-10-CM | POA: Diagnosis present

## 2023-02-20 DIAGNOSIS — E871 Hypo-osmolality and hyponatremia: Secondary | ICD-10-CM | POA: Diagnosis present

## 2023-02-20 DIAGNOSIS — Z9359 Other cystostomy status: Secondary | ICD-10-CM

## 2023-02-20 DIAGNOSIS — K219 Gastro-esophageal reflux disease without esophagitis: Secondary | ICD-10-CM | POA: Diagnosis present

## 2023-02-20 DIAGNOSIS — Z880 Allergy status to penicillin: Secondary | ICD-10-CM

## 2023-02-20 DIAGNOSIS — Z7984 Long term (current) use of oral hypoglycemic drugs: Secondary | ICD-10-CM

## 2023-02-20 DIAGNOSIS — D649 Anemia, unspecified: Secondary | ICD-10-CM | POA: Diagnosis present

## 2023-02-20 DIAGNOSIS — L89622 Pressure ulcer of left heel, stage 2: Secondary | ICD-10-CM | POA: Diagnosis present

## 2023-02-20 DIAGNOSIS — Z8249 Family history of ischemic heart disease and other diseases of the circulatory system: Secondary | ICD-10-CM

## 2023-02-20 DIAGNOSIS — Z794 Long term (current) use of insulin: Secondary | ICD-10-CM

## 2023-02-20 DIAGNOSIS — R532 Functional quadriplegia: Secondary | ICD-10-CM | POA: Diagnosis present

## 2023-02-20 DIAGNOSIS — G373 Acute transverse myelitis in demyelinating disease of central nervous system: Secondary | ICD-10-CM | POA: Diagnosis present

## 2023-02-20 DIAGNOSIS — Z881 Allergy status to other antibiotic agents status: Secondary | ICD-10-CM

## 2023-02-20 DIAGNOSIS — E119 Type 2 diabetes mellitus without complications: Secondary | ICD-10-CM

## 2023-02-20 DIAGNOSIS — Z825 Family history of asthma and other chronic lower respiratory diseases: Secondary | ICD-10-CM

## 2023-02-20 DIAGNOSIS — G9341 Metabolic encephalopathy: Secondary | ICD-10-CM | POA: Diagnosis present

## 2023-02-20 DIAGNOSIS — Z87891 Personal history of nicotine dependence: Secondary | ICD-10-CM

## 2023-02-20 DIAGNOSIS — N319 Neuromuscular dysfunction of bladder, unspecified: Secondary | ICD-10-CM | POA: Diagnosis present

## 2023-02-20 DIAGNOSIS — Z79899 Other long term (current) drug therapy: Secondary | ICD-10-CM

## 2023-02-20 DIAGNOSIS — Z87440 Personal history of urinary (tract) infections: Secondary | ICD-10-CM

## 2023-02-20 DIAGNOSIS — R6520 Severe sepsis without septic shock: Secondary | ICD-10-CM | POA: Diagnosis present

## 2023-02-20 DIAGNOSIS — Z955 Presence of coronary angioplasty implant and graft: Secondary | ICD-10-CM

## 2023-02-20 DIAGNOSIS — E118 Type 2 diabetes mellitus with unspecified complications: Secondary | ICD-10-CM

## 2023-02-20 DIAGNOSIS — Z888 Allergy status to other drugs, medicaments and biological substances status: Secondary | ICD-10-CM

## 2023-02-20 DIAGNOSIS — Z85820 Personal history of malignant melanoma of skin: Secondary | ICD-10-CM

## 2023-02-20 DIAGNOSIS — R531 Weakness: Secondary | ICD-10-CM

## 2023-02-20 DIAGNOSIS — I252 Old myocardial infarction: Secondary | ICD-10-CM

## 2023-02-20 DIAGNOSIS — L89612 Pressure ulcer of right heel, stage 2: Secondary | ICD-10-CM | POA: Diagnosis present

## 2023-02-20 DIAGNOSIS — L89154 Pressure ulcer of sacral region, stage 4: Secondary | ICD-10-CM | POA: Diagnosis present

## 2023-02-20 DIAGNOSIS — A419 Sepsis, unspecified organism: Secondary | ICD-10-CM | POA: Diagnosis present

## 2023-02-20 DIAGNOSIS — Z7401 Bed confinement status: Secondary | ICD-10-CM

## 2023-02-20 DIAGNOSIS — K632 Fistula of intestine: Secondary | ICD-10-CM | POA: Diagnosis present

## 2023-02-20 NOTE — ED Triage Notes (Signed)
Pt is coming in for weakness, is a quadriplegic, mentions he feels weaker than normal. Pt was a&o x4 per medic, pt mentions a bed sore on his sacrum that is bothering him, also mentions not eating breakfast or lunch.   Medic Vitals  116/64 60 99% 4l [place] O2 this is normal for him CBG 231  97.4

## 2023-02-21 ENCOUNTER — Inpatient Hospital Stay (HOSPITAL_COMMUNITY)
Admission: RE | Admit: 2023-02-21 | Discharge: 2023-02-21 | Disposition: A | Discharge status: Home / Self Care | Payer: Medicare Other | Source: Ambulatory Visit | Attending: Interventional Radiology | Admitting: Interventional Radiology

## 2023-02-21 ENCOUNTER — Ambulatory Visit (HOSPITAL_COMMUNITY): Payer: Medicare Other

## 2023-02-21 ENCOUNTER — Emergency Department (HOSPITAL_COMMUNITY): Payer: Medicare Other

## 2023-02-21 ENCOUNTER — Observation Stay (HOSPITAL_COMMUNITY): Payer: Medicare Other

## 2023-02-21 DIAGNOSIS — E118 Type 2 diabetes mellitus with unspecified complications: Secondary | ICD-10-CM

## 2023-02-21 DIAGNOSIS — Z794 Long term (current) use of insulin: Secondary | ICD-10-CM

## 2023-02-21 DIAGNOSIS — G9341 Metabolic encephalopathy: Secondary | ICD-10-CM | POA: Diagnosis present

## 2023-02-21 DIAGNOSIS — E871 Hypo-osmolality and hyponatremia: Secondary | ICD-10-CM | POA: Diagnosis present

## 2023-02-21 DIAGNOSIS — G4733 Obstructive sleep apnea (adult) (pediatric): Secondary | ICD-10-CM | POA: Diagnosis present

## 2023-02-21 DIAGNOSIS — N319 Neuromuscular dysfunction of bladder, unspecified: Secondary | ICD-10-CM | POA: Diagnosis present

## 2023-02-21 DIAGNOSIS — E860 Dehydration: Secondary | ICD-10-CM

## 2023-02-21 DIAGNOSIS — R531 Weakness: Secondary | ICD-10-CM

## 2023-02-21 DIAGNOSIS — I2510 Atherosclerotic heart disease of native coronary artery without angina pectoris: Secondary | ICD-10-CM | POA: Diagnosis present

## 2023-02-21 DIAGNOSIS — R6520 Severe sepsis without septic shock: Secondary | ICD-10-CM | POA: Diagnosis present

## 2023-02-21 DIAGNOSIS — E872 Acidosis, unspecified: Secondary | ICD-10-CM

## 2023-02-21 DIAGNOSIS — E119 Type 2 diabetes mellitus without complications: Secondary | ICD-10-CM

## 2023-02-21 DIAGNOSIS — I252 Old myocardial infarction: Secondary | ICD-10-CM | POA: Diagnosis not present

## 2023-02-21 DIAGNOSIS — L89612 Pressure ulcer of right heel, stage 2: Secondary | ICD-10-CM | POA: Diagnosis present

## 2023-02-21 DIAGNOSIS — N390 Urinary tract infection, site not specified: Secondary | ICD-10-CM

## 2023-02-21 DIAGNOSIS — L89622 Pressure ulcer of left heel, stage 2: Secondary | ICD-10-CM | POA: Diagnosis present

## 2023-02-21 DIAGNOSIS — I10 Essential (primary) hypertension: Secondary | ICD-10-CM

## 2023-02-21 DIAGNOSIS — T83518A Infection and inflammatory reaction due to other urinary catheter, initial encounter: Secondary | ICD-10-CM | POA: Diagnosis present

## 2023-02-21 DIAGNOSIS — E781 Pure hyperglyceridemia: Secondary | ICD-10-CM | POA: Diagnosis present

## 2023-02-21 DIAGNOSIS — K316 Fistula of stomach and duodenum: Secondary | ICD-10-CM

## 2023-02-21 DIAGNOSIS — R532 Functional quadriplegia: Secondary | ICD-10-CM | POA: Diagnosis present

## 2023-02-21 DIAGNOSIS — D649 Anemia, unspecified: Secondary | ICD-10-CM | POA: Diagnosis present

## 2023-02-21 DIAGNOSIS — Z9359 Other cystostomy status: Secondary | ICD-10-CM | POA: Diagnosis not present

## 2023-02-21 DIAGNOSIS — G373 Acute transverse myelitis in demyelinating disease of central nervous system: Secondary | ICD-10-CM | POA: Diagnosis present

## 2023-02-21 DIAGNOSIS — L89154 Pressure ulcer of sacral region, stage 4: Secondary | ICD-10-CM | POA: Diagnosis present

## 2023-02-21 DIAGNOSIS — Y738 Miscellaneous gastroenterology and urology devices associated with adverse incidents, not elsewhere classified: Secondary | ICD-10-CM | POA: Diagnosis present

## 2023-02-21 DIAGNOSIS — E669 Obesity, unspecified: Secondary | ICD-10-CM | POA: Diagnosis present

## 2023-02-21 DIAGNOSIS — K632 Fistula of intestine: Secondary | ICD-10-CM | POA: Diagnosis present

## 2023-02-21 DIAGNOSIS — K219 Gastro-esophageal reflux disease without esophagitis: Secondary | ICD-10-CM | POA: Diagnosis present

## 2023-02-21 DIAGNOSIS — A419 Sepsis, unspecified organism: Secondary | ICD-10-CM | POA: Diagnosis not present

## 2023-02-21 HISTORY — PX: IR FLUORO PROCEDURE UNLISTED: IMG2388

## 2023-02-21 LAB — CBC WITH DIFFERENTIAL/PLATELET
Abs Immature Granulocytes: 0.05 10*3/uL (ref 0.00–0.07)
Basophils Absolute: 0 10*3/uL (ref 0.0–0.1)
Basophils Relative: 0 %
Eosinophils Absolute: 0.2 10*3/uL (ref 0.0–0.5)
Eosinophils Relative: 2 %
HCT: 35.8 % — ABNORMAL LOW (ref 39.0–52.0)
Hemoglobin: 11.5 g/dL — ABNORMAL LOW (ref 13.0–17.0)
Immature Granulocytes: 1 %
Lymphocytes Relative: 15 %
Lymphs Abs: 1.4 10*3/uL (ref 0.7–4.0)
MCH: 28.8 pg (ref 26.0–34.0)
MCHC: 32.1 g/dL (ref 30.0–36.0)
MCV: 89.7 fL (ref 80.0–100.0)
Monocytes Absolute: 0.7 10*3/uL (ref 0.1–1.0)
Monocytes Relative: 7 %
Neutro Abs: 7.4 10*3/uL (ref 1.7–7.7)
Neutrophils Relative %: 75 %
Platelets: 268 10*3/uL (ref 150–400)
RBC: 3.99 MIL/uL — ABNORMAL LOW (ref 4.22–5.81)
RDW: 15.9 % — ABNORMAL HIGH (ref 11.5–15.5)
WBC: 9.7 10*3/uL (ref 4.0–10.5)
nRBC: 0 % (ref 0.0–0.2)

## 2023-02-21 LAB — COMPREHENSIVE METABOLIC PANEL
ALT: 24 U/L (ref 0–44)
AST: 20 U/L (ref 15–41)
Albumin: 2.9 g/dL — ABNORMAL LOW (ref 3.5–5.0)
Alkaline Phosphatase: 78 U/L (ref 38–126)
Anion gap: 15 (ref 5–15)
BUN: 24 mg/dL — ABNORMAL HIGH (ref 8–23)
CO2: 21 mmol/L — ABNORMAL LOW (ref 22–32)
Calcium: 9.2 mg/dL (ref 8.9–10.3)
Chloride: 99 mmol/L (ref 98–111)
Creatinine, Ser: 0.93 mg/dL (ref 0.61–1.24)
GFR, Estimated: >60 mL/min (ref >60)
Glucose, Bld: 256 mg/dL — ABNORMAL HIGH (ref 70–99)
Potassium: 5.1 mmol/L (ref 3.5–5.1)
Sodium: 135 mmol/L (ref 135–145)
Total Bilirubin: 0.5 mg/dL (ref 0.3–1.2)
Total Protein: 5.9 g/dL — ABNORMAL LOW (ref 6.5–8.1)

## 2023-02-21 LAB — C-REACTIVE PROTEIN: CRP: 8.4 mg/dL — ABNORMAL HIGH (ref <1.0)

## 2023-02-21 LAB — APTT: aPTT: 33 seconds (ref 24–36)

## 2023-02-21 LAB — URINALYSIS, ROUTINE W REFLEX MICROSCOPIC
Bilirubin Urine: NEGATIVE
Glucose, UA: 50 mg/dL — AB
Hgb urine dipstick: NEGATIVE
Ketones, ur: NEGATIVE mg/dL
Nitrite: POSITIVE — AB
Protein, ur: 100 mg/dL — AB
Specific Gravity, Urine: 1.017 (ref 1.005–1.030)
WBC, UA: >50 WBC/hpf (ref 0–5)
pH: 5 (ref 5.0–8.0)

## 2023-02-21 LAB — GLUCOSE, CAPILLARY: Glucose-Capillary: 262 mg/dL — ABNORMAL HIGH (ref 70–99)

## 2023-02-21 LAB — CULTURE, BLOOD (ROUTINE X 2): Culture: NO GROWTH

## 2023-02-21 LAB — PROTIME-INR
INR: 1.1 (ref 0.8–1.2)
Prothrombin Time: 13.9 seconds (ref 11.4–15.2)

## 2023-02-21 LAB — CBG MONITORING, ED: Glucose-Capillary: 262 mg/dL — ABNORMAL HIGH (ref 70–99)

## 2023-02-21 LAB — LACTIC ACID, PLASMA
Lactic Acid, Venous: 3 mmol/L (ref 0.5–1.9)
Lactic Acid, Venous: 2.9 mmol/L (ref 0.5–1.9)
Lactic Acid, Venous: 2.5 mmol/L (ref 0.5–1.9)

## 2023-02-21 MED ORDER — SODIUM CHLORIDE 0.9% FLUSH
3.0000 mL | Freq: Two times a day (BID) | INTRAVENOUS | Status: DC
  Administered 2023-02-21 – 2023-02-25 (×7): 3 mL via INTRAVENOUS

## 2023-02-21 MED ORDER — FAMOTIDINE 20 MG PO TABS
20.0000 mg | ORAL_TABLET | Freq: Two times a day (BID) | ORAL | Status: DC
  Administered 2023-02-21 – 2023-02-25 (×8): 20 mg via ORAL
  Filled 2023-02-21 (×8): qty 1

## 2023-02-21 MED ORDER — ACETAMINOPHEN 650 MG RE SUPP: 650.0000 mg | Freq: Four times a day (QID) | RECTAL | Status: DC | PRN

## 2023-02-21 MED ORDER — IOHEXOL 300 MG/ML  SOLN
50.0000 mL | Freq: Once | INTRAMUSCULAR | Status: AC | PRN
  Administered 2023-02-21: 10 mL

## 2023-02-21 MED ORDER — ENOXAPARIN SODIUM 40 MG/0.4ML IJ SOSY
40.0000 mg | PREFILLED_SYRINGE | INTRAMUSCULAR | Status: DC
  Administered 2023-02-21 – 2023-02-24 (×4): 40 mg via SUBCUTANEOUS
  Filled 2023-02-21 (×4): qty 0.4

## 2023-02-21 MED ORDER — TRAMADOL HCL 50 MG PO TABS
50.0000 mg | ORAL_TABLET | Freq: Every day | ORAL | Status: DC | PRN
  Administered 2023-02-22 – 2023-02-24 (×3): 50 mg via ORAL
  Filled 2023-02-21 (×3): qty 1

## 2023-02-21 MED ORDER — SENNOSIDES-DOCUSATE SODIUM 8.6-50 MG PO TABS
1.0000 | ORAL_TABLET | Freq: Two times a day (BID) | ORAL | Status: DC
  Administered 2023-02-21 – 2023-02-25 (×8): 1 via ORAL
  Filled 2023-02-21 (×8): qty 1

## 2023-02-21 MED ORDER — CEFAZOLIN SODIUM-DEXTROSE 1-4 GM/50ML-% IV SOLN
1.0000 g | Freq: Once | INTRAVENOUS | Status: AC
  Administered 2023-02-21: 1 g via INTRAVENOUS
  Filled 2023-02-21: qty 50

## 2023-02-21 MED ORDER — FENTANYL CITRATE (PF) 100 MCG/2ML IJ SOLN
INTRAMUSCULAR | Status: AC | PRN
  Administered 2023-02-21 (×2): 50 ug via INTRAVENOUS

## 2023-02-21 MED ORDER — ATENOLOL 25 MG PO TABS: 12.5000 mg | ORAL_TABLET | ORAL | Status: DC

## 2023-02-21 MED ORDER — ATORVASTATIN CALCIUM 10 MG PO TABS
20.0000 mg | ORAL_TABLET | Freq: Every day | ORAL | Status: DC
  Administered 2023-02-21 – 2023-02-24 (×4): 20 mg via ORAL
  Filled 2023-02-21 (×4): qty 2

## 2023-02-21 MED ORDER — ONDANSETRON HCL 4 MG/2ML IJ SOLN
4.0000 mg | Freq: Four times a day (QID) | INTRAMUSCULAR | Status: DC | PRN
  Administered 2023-02-22: 4 mg via INTRAVENOUS
  Filled 2023-02-21: qty 2

## 2023-02-21 MED ORDER — DULOXETINE HCL 30 MG PO CPEP
30.0000 mg | ORAL_CAPSULE | Freq: Every day | ORAL | Status: DC
  Administered 2023-02-21 – 2023-02-24 (×4): 30 mg via ORAL
  Filled 2023-02-21 (×4): qty 1

## 2023-02-21 MED ORDER — LACTATED RINGERS IV SOLN: INTRAVENOUS | Status: AC

## 2023-02-21 MED ORDER — NYSTATIN 100000 UNIT/GM EX POWD
1.0000 | Freq: Every day | CUTANEOUS | Status: DC
  Administered 2023-02-23 – 2023-02-25 (×3): 1 via TOPICAL
  Filled 2023-02-21: qty 15

## 2023-02-21 MED ORDER — ONDANSETRON HCL 4 MG PO TABS: 4.0000 mg | ORAL_TABLET | Freq: Four times a day (QID) | ORAL | Status: DC | PRN

## 2023-02-21 MED ORDER — INSULIN ASPART 100 UNIT/ML IJ SOLN
0.0000 [IU] | Freq: Three times a day (TID) | INTRAMUSCULAR | Status: DC
  Administered 2023-02-21 – 2023-02-22 (×4): 11 [IU] via SUBCUTANEOUS
  Administered 2023-02-23: 15 [IU] via SUBCUTANEOUS
  Administered 2023-02-23 (×2): 7 [IU] via SUBCUTANEOUS
  Administered 2023-02-24: 11 [IU] via SUBCUTANEOUS
  Administered 2023-02-24: 15 [IU] via SUBCUTANEOUS
  Administered 2023-02-24: 7 [IU] via SUBCUTANEOUS
  Administered 2023-02-25: 11 [IU] via SUBCUTANEOUS

## 2023-02-21 MED ORDER — CHLORHEXIDINE GLUCONATE CLOTH 2 % EX PADS
6.0000 | MEDICATED_PAD | Freq: Every day | CUTANEOUS | Status: DC
  Administered 2023-02-21 – 2023-02-25 (×4): 6 via TOPICAL

## 2023-02-21 MED ORDER — ATENOLOL 50 MG PO TABS
25.0000 mg | ORAL_TABLET | Freq: Every evening | ORAL | Status: DC
  Administered 2023-02-21 – 2023-02-23 (×3): 25 mg via ORAL
  Filled 2023-02-21 (×4): qty 1

## 2023-02-21 MED ORDER — LORATADINE 10 MG PO TABS
10.0000 mg | ORAL_TABLET | Freq: Every day | ORAL | Status: DC
  Administered 2023-02-22 – 2023-02-25 (×4): 10 mg via ORAL
  Filled 2023-02-21 (×4): qty 1

## 2023-02-21 MED ORDER — TRAZODONE HCL 50 MG PO TABS
75.0000 mg | ORAL_TABLET | Freq: Every day | ORAL | Status: DC
  Administered 2023-02-21 – 2023-02-24 (×4): 75 mg via ORAL
  Filled 2023-02-21 (×4): qty 2

## 2023-02-21 MED ORDER — INSULIN GLARGINE-YFGN 100 UNIT/ML ~~LOC~~ SOLN
40.0000 [IU] | Freq: Every day | SUBCUTANEOUS | Status: DC
  Administered 2023-02-22 – 2023-02-23 (×2): 40 [IU] via SUBCUTANEOUS
  Filled 2023-02-21 (×3): qty 0.4

## 2023-02-21 MED ORDER — MELATONIN 3 MG PO TABS
3.0000 mg | ORAL_TABLET | Freq: Every day | ORAL | Status: DC
  Administered 2023-02-21 – 2023-02-24 (×4): 3 mg via ORAL
  Filled 2023-02-21 (×4): qty 1

## 2023-02-21 MED ORDER — MIDAZOLAM HCL 2 MG/2ML IJ SOLN
INTRAMUSCULAR | Status: AC | PRN
  Administered 2023-02-21 (×2): 1 mg via INTRAVENOUS

## 2023-02-21 MED ORDER — LIDOCAINE VISCOUS HCL 2 % MT SOLN
OROMUCOSAL | Status: AC
  Filled 2023-02-21: qty 15

## 2023-02-21 MED ORDER — ALBUTEROL SULFATE (2.5 MG/3ML) 0.083% IN NEBU: 2.5000 mg | INHALATION_SOLUTION | Freq: Four times a day (QID) | RESPIRATORY_TRACT | Status: DC | PRN

## 2023-02-21 MED ORDER — POLYETHYL GLYCOL-PROPYL GLYCOL 0.4-0.3 % OP GEL: 1.0000 | Freq: Four times a day (QID) | OPHTHALMIC | Status: DC | PRN

## 2023-02-21 MED ORDER — SODIUM CHLORIDE 0.9 % IV SOLN
2.0000 g | INTRAVENOUS | Status: DC
  Administered 2023-02-21 – 2023-02-22 (×2): 2 g via INTRAVENOUS
  Filled 2023-02-21 (×2): qty 20

## 2023-02-21 MED ORDER — ATENOLOL 25 MG PO TABS
12.5000 mg | ORAL_TABLET | Freq: Every morning | ORAL | Status: DC
  Administered 2023-02-21 – 2023-02-25 (×4): 12.5 mg via ORAL
  Filled 2023-02-21 (×2): qty 0.5
  Filled 2023-02-21: qty 1
  Filled 2023-02-21 (×2): qty 0.5

## 2023-02-21 MED ORDER — MIDAZOLAM HCL 2 MG/2ML IJ SOLN
INTRAMUSCULAR | Status: AC
  Filled 2023-02-21: qty 2

## 2023-02-21 MED ORDER — BACLOFEN 10 MG PO TABS
10.0000 mg | ORAL_TABLET | Freq: Three times a day (TID) | ORAL | Status: DC
  Administered 2023-02-21 – 2023-02-25 (×12): 10 mg via ORAL
  Filled 2023-02-21 (×12): qty 1

## 2023-02-21 MED ORDER — ACETAMINOPHEN 325 MG PO TABS
650.0000 mg | ORAL_TABLET | Freq: Four times a day (QID) | ORAL | Status: DC | PRN
  Administered 2023-02-23 – 2023-02-25 (×2): 650 mg via ORAL
  Filled 2023-02-21 (×2): qty 2

## 2023-02-21 MED ORDER — LIDOCAINE HCL 1 % IJ SOLN
INTRAMUSCULAR | Status: AC
  Filled 2023-02-21: qty 20

## 2023-02-21 MED ORDER — INSULIN ASPART 100 UNIT/ML IJ SOLN
0.0000 [IU] | Freq: Every day | INTRAMUSCULAR | Status: DC
  Administered 2023-02-21: 3 [IU] via SUBCUTANEOUS
  Administered 2023-02-23: 5 [IU] via SUBCUTANEOUS
  Administered 2023-02-24: 3 [IU] via SUBCUTANEOUS

## 2023-02-21 MED ORDER — LACTATED RINGERS IV BOLUS
1000.0000 mL | Freq: Once | INTRAVENOUS | Status: AC
  Administered 2023-02-21: 1000 mL via INTRAVENOUS

## 2023-02-21 MED ORDER — FENTANYL CITRATE (PF) 100 MCG/2ML IJ SOLN
INTRAMUSCULAR | Status: AC
  Filled 2023-02-21: qty 2

## 2023-02-21 MED ORDER — LOSARTAN POTASSIUM 50 MG PO TABS
75.0000 mg | ORAL_TABLET | Freq: Every day | ORAL | Status: DC
  Administered 2023-02-22 – 2023-02-25 (×4): 75 mg via ORAL
  Filled 2023-02-21 (×4): qty 2

## 2023-02-21 MED ORDER — LAMOTRIGINE 25 MG PO TABS
25.0000 mg | ORAL_TABLET | Freq: Three times a day (TID) | ORAL | Status: DC
  Administered 2023-02-21 – 2023-02-25 (×11): 25 mg via ORAL
  Filled 2023-02-21 (×12): qty 1

## 2023-02-21 NOTE — Consult Note (Addendum)
Chief Complaint: Patient was seen in consultation today for enterocutaneous fistula plug closure Chief Complaint  Patient presents with   Weakness   at the request of Dr Meridee Score  Supervising Physician: Roanna Banning  Patient Status: [facility] - ED  History of Present Illness: Sreekar Manner. is a 77 y.o. male    FULL Code Status per pt History of surgically placed gastrostomy (06/04/2020), with subsequent removal 02/10/2022. Patient with persistent drainage at gastrostomy tract with concern for GC fistula. Prior endoscopic attempts at closure with OTSC/Ovesco clips (07/11/2022 and 10/13/2022) unsuccessful. Patient referred to [facility]IR for attempt at percutaneous fistula closure.  Last IR procedure:  01/26/23: Replacement of 16 French balloon retention gastrostomy tube under fluoroscopy. The patient is scheduled for elective gastrocutaneous fistula closure with a special order closure plug in early June  Pt was scheduled today in IR at [facility] for gastrocutaneous fistula closure today. But-- pt now in ED at [facility] for "hallucinations".  Dehydration and fatigue He says he has not had any hallucinations since last night Afeb WBC wnl Probable UTI Lactic acid 2.5 BC pending  IR considering moving forward with the gastrocutaneous fistula closure today. Dr Milford Cage aware of admission to [facility]-- Will discuss with him asap.  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 angioplasty of his RCA in 1990. b. bare metal stent placed in the RCA in 2000 followed by rotational atherectomy shortly after for stent restenosis. c. last cath was in 2012 showing stable moderate diffuse CAD. (70% mid LAD, 80% diagonal, 70% Ramus, 40% mid to distal RCA stent restenosis). d. Low risk nuc in 2015.   Diabetes mellitus    Diverticulosis     Elevated CK    Erectile dysfunction    Hemorrhoids    HTN (hypertension)    Hyperlipidemia    Hypertriglyceridemia    Malignant melanoma of left side of neck (HCC) 10/25/2018   Myocardial infarction (HCC)    Obesity    OSA (obstructive sleep apnea)    Persistent disorder of initiating or maintaining sleep    Personal history of colonic polyps 02/05/2003   Renal lesion 06/20/2016    Past Surgical History:  Procedure Laterality Date   BIOPSY  03/26/2022   Procedure: BIOPSY;  Surgeon: Iva Boop, MD;  Location: [facility] ENDOSCOPY;  Service: Gastroenterology;;   CHOLECYSTECTOMY     CORONARY ANGIOPLASTY     CORONARY STENT PLACEMENT     stenting of the right coronary artery with followup rotational  atherectomy. (3 stents placed)   ENDOSCOPIC MUCOSAL RESECTION  10/13/2022   Procedure: ENDOSCOPIC MUCOSAL RESECTION;  Surgeon: Meridee Score Netty Starring., MD;  Location: [facility] ENDOSCOPY;  Service: Gastroenterology;;   ESOPHAGOGASTRODUODENOSCOPY (EGD) WITH PROPOFOL N/A 03/26/2022   Procedure: ESOPHAGOGASTRODUODENOSCOPY (EGD) WITH PROPOFOL;  Surgeon: Iva Boop, MD;  Location: [facility] ENDOSCOPY;  Service: Gastroenterology;  Laterality: N/A;   ESOPHAGOGASTRODUODENOSCOPY (EGD) WITH PROPOFOL N/A 07/11/2022   Procedure: ESOPHAGOGASTRODUODENOSCOPY (EGD) WITH PROPOFOL;  Surgeon: Meridee Score Netty Starring., MD;  Location: [facility] ENDOSCOPY;  Service: Gastroenterology;  Laterality: N/A;   ESOPHAGOGASTRODUODENOSCOPY (EGD) WITH PROPOFOL N/A 10/13/2022   Procedure: ESOPHAGOGASTRODUODENOSCOPY (EGD) WITH PROPOFOL;  Surgeon: Meridee Score Netty Starring., MD;  Location: [facility] ENDOSCOPY;  Service: Gastroenterology;  Laterality: N/A;   EUS N/A 10/13/2022   Procedure: UPPER ENDOSCOPIC ULTRASOUND (EUS) RADIAL;  Surgeon: Lemar Lofty., MD;  Location: [facility] ENDOSCOPY;  Service: Gastroenterology;  Laterality: N/A;   FINGER SURGERY     right   FOOT SURGERY     right   HEMOSTASIS CLIP PLACEMENT  07/11/2022   Procedure: HEMOSTASIS CLIP  PLACEMENT;  Surgeon: Meridee Score Netty Starring., MD;  Location: [facility] ENDOSCOPY;  Service: Gastroenterology;;  ovesco    HEMOSTASIS CLIP PLACEMENT  10/13/2022   Procedure: HEMOSTASIS CLIP PLACEMENT;  Surgeon: Lemar Lofty., MD;  Location: [facility] ENDOSCOPY;  Service: Gastroenterology;;   HOT HEMOSTASIS N/A 07/11/2022   Procedure: HOT HEMOSTASIS (ARGON PLASMA COAGULATION/BICAP);  Surgeon: Lemar Lofty., MD;  Location: [facility] ENDOSCOPY;  Service: Gastroenterology;  Laterality: N/A;   HOT HEMOSTASIS N/A 10/13/2022   Procedure: HOT HEMOSTASIS (ARGON PLASMA COAGULATION/BICAP);  Surgeon: Lemar Lofty., MD;  Location: [facility] ENDOSCOPY;  Service: Gastroenterology;  Laterality: N/A;   INGUINAL HERNIA REPAIR     right   IR GASTROSTOMY TUBE REMOVAL  02/10/2022   IR REPLC GASTRO/COLONIC TUBE PERCUT W/FLUORO  12/22/2022   IR REPLC GASTRO/COLONIC TUBE PERCUT W/FLUORO  01/26/2023   IRRIGATION AND DEBRIDEMENT ABSCESS N/A 06/04/2020   Procedure: IRRIGATION AND DEBRIDEMENT SACRAL WOUND;  Surgeon: Diamantina Monks, MD;  Location: [facility] OR;  Service: General;  Laterality: N/A;   LAPAROSCOPY N/A 06/04/2020   Procedure: LAPAROSCOPY ASSISTED COLOSTOMY, LAPAROSCOPY GASTROSTOMY;  Surgeon: Diamantina Monks, MD;  Location: [facility] OR;  Service: General;  Laterality: N/A;   LYSIS OF ADHESION N/A 06/04/2020   Procedure: LYSIS OF ADHESION;  Surgeon: Diamantina Monks, MD;  Location: [facility] OR;  Service: General;  Laterality: N/A;   melanoma removal     neck   ORTHOPEDIC SURGERY     foot right   POLYPECTOMY  10/13/2022   Procedure: POLYPECTOMY;  Surgeon: Lemar Lofty., MD;  Location: [facility] ENDOSCOPY;  Service: Gastroenterology;;   rotator cuff surg     Bil   STENT REMOVAL  10/13/2022   Procedure: CLIP REMOVAL;  Surgeon: Lemar Lofty., MD;  Location: [facility] ENDOSCOPY;  Service: Gastroenterology;;   SUBMUCOSAL LIFTING INJECTION  10/13/2022   Procedure: SUBMUCOSAL LIFTING INJECTION;  Surgeon: Lemar Lofty., MD;   Location: [facility] ENDOSCOPY;  Service: Gastroenterology;;   SUBMUCOSAL TATTOO INJECTION  10/13/2022   Procedure: SUBMUCOSAL TATTOO INJECTION;  Surgeon: Lemar Lofty., MD;  Location: [facility] ENDOSCOPY;  Service: Gastroenterology;;    Allergies: Gabapentin, Levofloxacin, and Penicillins  Medications: Prior to Admission medications   Medication Sig Start Date End Date Taking? Authorizing Provider  Alogliptin Benzoate 25 MG TABS Take 25 mg by mouth daily.   Yes [provider]  Amino Acids-Protein Hydrolys (FEEDING SUPPLEMENT, PRO-STAT 64,) LIQD Take 30 mLs by mouth in the morning and at bedtime.   Yes [provider]  atenolol (TENORMIN) 25 MG tablet Take 12.5-25 mg by mouth See admin instructions. Take 12.5 mg (1/2 tablet) by mouth once daily in the morning and 25 mg (1 tablet) at bedtime.   Yes [provider]  atorvastatin (LIPITOR) 20 MG tablet Take 1 tablet (20 mg total) by mouth daily. Patient taking differently: Take 20 mg by mouth at bedtime. 03/26/20  Yes Burnadette Pop, MD  B COMPLEX VITAMINS PO Take 1 capsule by mouth daily.   Yes [provider]  baclofen (LIORESAL) 10 MG tablet Take 10 mg by mouth 3 (three) times daily.   Yes [provider]  cephALEXin (KEFLEX) 250 MG capsule Take 250 mg by mouth daily.   Yes [provider]  cholecalciferol (VITAMIN D3) 25 MCG (1000 UNIT) tablet Take 1,000 Units by mouth daily.   Yes [provider]  DULoxetine (CYMBALTA) 30 MG capsule Take 30 mg by mouth at bedtime.   Yes [provider]  famotidine (PEPCID) 20 MG tablet Take 20 mg by mouth 2 (two) times daily.   Yes [provider]  fexofenadine (ALLEGRA) 180 MG tablet Take 180 mg by mouth daily.   Yes [provider]  gentamicin cream (GARAMYCIN) 0.1 % Apply 1 Application topically daily. To right 4th digit cuticle after washing with soap and water. 10/05/22  Yes [provider]  insulin aspart  (NOVOLOG) 100 UNIT/ML injection Inject 0-15 Units into the skin every 4 (four) hours. Sliding scale  CBG 70 - 120: 0 units: CBG 121 - 150: 2 units; CBG 151 - 200: 3 units; CBG 201 - 250: 5 units; CBG 251 - 300: 8 units;CBG 301 - 350: 11 units; CBG 351 - 400: 15 units; CBG > 400 : 15 units and notify MD Patient taking differently: Inject 26 Units into the skin See admin instructions. Inject 26 units into the skin before meals. Do not give if CBG <177ml/dl. 06/10/20  Yes Rai, Ripudeep K, MD  insulin glargine (LANTUS) 100 unit/mL SOPN Inject 15 Units into the skin at bedtime. Patient taking differently: Inject 75 Units into the skin daily. 06/10/20  Yes Rai, Ripudeep K, MD  lamoTRIgine (LAMICTAL) 25 MG tablet Take 25 mg by mouth 3 (three) times daily. For 5 days. Started 02/18/23.   Yes [provider]  losartan (COZAAR) 50 MG tablet Take 75 mg by mouth daily.   Yes [provider]  Magnesium Oxide, Elemental, 400 MG TABS Take 400 mg by mouth 2 (two) times daily.   Yes [provider]  melatonin 3 MG TABS tablet Take 3 mg by mouth at bedtime.   Yes [provider]  Menthol, Topical Analgesic, (BIOFREEZE) 4 % GEL Apply 1 application  topically 2 (two) times daily as needed (Pain to back upper and lower arms and shoulders).   Yes [provider]  metFORMIN (GLUCOPHAGE) 500 MG tablet Take 500 mg by mouth 2 (two) times daily with a meal.   Yes [provider]  nystatin powder Apply 1 Application topically daily. Apply to peri-wound bed to sacrum and right ischium daily with routine dressing.   Yes [provider]  Omega-3 Fatty Acids (FISH OIL) 1000 MG CAPS Take 1,000 mg by mouth daily.   Yes [provider]  omeprazole (PRILOSEC) 40 MG capsule Take 1 capsule (40 mg total) by mouth 2 (two) times daily. 30 minutes before breakfast/dinner Patient taking differently: Take 40 mg by mouth 2 (two) times daily. 10/13/22  Yes Mansouraty, Netty Starring.,  MD  ondansetron (ZOFRAN) 4 MG tablet Take 4 mg by mouth every 8 (eight) hours as needed for nausea.   Yes [provider]  Polyethyl Glycol-Propyl Glycol (GENTEAL TEARS SEVERE DAY/NIGHT) 0.4-0.3 % GEL ophthalmic gel Place 1 Application into both eyes 4 (four) times daily as needed (Dry Eyes).   Yes [provider]  PSYLLIUM PO Take 0.8 g by mouth in the morning and at bedtime.   Yes [provider]  senna-docusate (SENOKOT-S) 8.6-50 MG tablet Take 1 tablet by mouth 2 (two) times daily.   Yes [provider]  spironolactone (ALDACTONE) 25 MG tablet Take 25 mg by mouth daily.   Yes [provider]  traMADol (ULTRAM) 50 MG tablet Take 50  mg by mouth daily as needed (Pain).   Yes [provider]  traZODone (DESYREL) 50 MG tablet Take 75 mg by mouth at bedtime.   Yes [provider]  vitamin C (ASCORBIC ACID) 500 MG tablet Take 500 mg by mouth daily.   Yes [provider]     Family History  Problem Relation Age of Onset   Lung cancer Mother    COPD Mother    Obesity Mother    Liver cancer Father    Lung cancer Father    Heart disease Father    Heart disease Sister    Colon cancer Neg Hx    Stomach cancer Neg Hx    Pancreatic cancer Neg Hx    Esophageal cancer Neg Hx    Inflammatory bowel disease Neg Hx    Liver disease Neg Hx    Rectal cancer Neg Hx     Social History   Socioeconomic History   Marital status: Married    Spouse name: Not on file   Number of children: 1   Years of education: Not on file   Highest education level: Not on file  Occupational History   Occupation: post-master    Comment: [place]  Tobacco Use   Smoking status: Former    Packs/day: 1.50    Years: 30.00    Additional pack years: 0.00    Total pack years: 45.00    Types: Cigarettes    Quit date: 09/19/1980    Years since quitting: 42.4   Smokeless tobacco: Never  Vaping Use   Vaping Use: Never used  Substance and Sexual  Activity   Alcohol use: Not Currently    Alcohol/week: 7.0 - 14.0 standard drinks of alcohol    Types: 7 - 14 Shots of liquor per week   Drug use: No   Sexual activity: Not Currently    Partners: Female  Other Topics Concern   Not on file  Social History Narrative   12/14/2018: Lives with wife and 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 (son had passed away several years ago)   Recevies much of his care through [place]   Enjoys golfing   Social Determinants of Health   Financial Resource Strain: Low Risk  (03/06/2020)   Overall Financial Resource Strain (CARDIA)    Difficulty of Paying Living Expenses: Not hard at all  Food Insecurity: No Food Insecurity (12/14/2018)   Hunger Vital Sign    Worried About Running Out of Food in the Last Year: Never true    Ran Out of Food in the Last Year: Never true  Transportation Needs: No Transportation Needs (03/06/2020)   PRAPARE - Administrator, Civil Service (Medical): No    Lack of Transportation (Non-Medical): No  Physical Activity: Inactive (12/14/2018)   Exercise Vital Sign    Days of Exercise per Week: 0 days    Minutes of Exercise per Session: 0 min  Stress: No Stress Concern Present (12/14/2018)   [employer] of Occupational Health - Occupational Stress Questionnaire    Feeling of Stress : Not at all  Social Connections: Unknown (12/14/2018)   Social Connection and Isolation Panel [NHANES]    Frequency of Communication with Friends and Family: Once a week    Frequency of Social Gatherings with Friends and Family: Never    Attends Religious Services: Not on Insurance claims handler of Clubs or Organizations: Not on file    Attends Banker Meetings: Not on file  Marital Status: Married     Review of Systems: A 12 point ROS discussed and pertinent positives are indicated in the HPI above.  All other systems are negative.  Review of Systems  Constitutional:  Positive for activity change and fatigue. Negative for fever.   Respiratory:  Negative for cough and shortness of breath.   Cardiovascular:  Negative for chest pain.  Gastrointestinal:  Negative for abdominal pain and nausea.  Neurological:  Positive for weakness.  Psychiatric/Behavioral:  Negative for behavioral problems and confusion.     Vital Signs: BP 111/62   Pulse 77   Temp 98.1 F (36.7 C) (Oral)   Resp 18   SpO2 100%   Advance Care Plan: The advanced care plan/surrogate decision maker was discussed at the time of visit and documented in the medical record.    Physical Exam Vitals reviewed.  HENT:     Mouth/Throat:     Mouth: Mucous membranes are moist.  Cardiovascular:     Rate and Rhythm: Normal rate and regular rhythm.     Heart sounds: Normal heart sounds.  Pulmonary:     Effort: Pulmonary effort is normal.     Breath sounds: Normal breath sounds.  Abdominal:     Palpations: Abdomen is soft.  Musculoskeletal:     Comments: Quadriplegic   Skin:    General: Skin is warm.  Neurological:     Mental Status: He is alert and oriented to person, place, and time.  Psychiatric:        Behavior: Behavior normal.     Imaging: CT HEAD WO CONTRAST ( )  Result Date: 02/21/2023 CLINICAL DATA:  Altered mental status EXAM: CT HEAD WITHOUT CONTRAST TECHNIQUE: Contiguous axial images were obtained from the base of the skull through the vertex without intravenous contrast. RADIATION DOSE REDUCTION: This exam was performed according to the departmental dose-optimization program which includes automated exposure control, adjustment of the mA and/or kV according to patient size and/or use of iterative reconstruction technique. COMPARISON:  08/06/2021 FINDINGS: Brain: No evidence of acute infarction, hemorrhage, hydrocephalus, extra-axial collection or mass lesion/mass effect. Stable arachnoid cyst is noted in the posterior fossa on the right. Mild atrophic changes are noted. Chronic white matter ischemic changes are seen as well. Vascular: No  hyperdense vessel or unexpected calcification. Skull: Normal. Negative for fracture or focal lesion. Sinuses/Orbits: No acute finding. Other: None. IMPRESSION: Chronic changes without acute abnormality. Electronically Signed   By: Alcide Clever M.D.   On: 02/21/2023 01:33   DG Chest Port 1 View  Result Date: 02/21/2023 CLINICAL DATA:  Possible sepsis EXAM: PORTABLE CHEST 1 VIEW COMPARISON:  03/25/2022 FINDINGS: Cardiac shadow is enlarged. Lungs are clear bilaterally. No bony abnormality is seen. IMPRESSION: No active disease. Electronically Signed   By: Alcide Clever M.D.   On: 02/21/2023 01:10   IR Replc Gastro/Colonic Tube Percut W/Fluoro  Result Date: 01/26/2023 INDICATION: Indwelling 16 French balloon retention gastrostomy tube has completely dislodged. This has been used to keep the tract open prior to planned elective repair of a persistent gastrocutaneous fistula with a special order enterocutaneous plug. EXAM: REPLACEMENT OF GASTROSTOMY TUBE UNDER FLUOROSCOPY MEDICATIONS: None ANESTHESIA/SEDATION: None CONTRAST:  20 mL Omnipaque 300-administered into the gastric lumen. FLUOROSCOPY TIME:  Fluoroscopy Time: 6 seconds.  9.0 mGy. COMPLICATIONS: None immediate. PROCEDURE: Informed written consent was obtained from the patient after a thorough discussion of the procedural risks, benefits and alternatives. All questions were addressed. Maximal Sterile Barrier Technique was utilized including caps, mask, sterile  gowns, sterile gloves, sterile drape, hand hygiene and skin antiseptic. A timeout was performed prior to the initiation of the procedure. A 16 French balloon retention gastrostomy tube was advanced through the pre-existing tract. Retention balloon was inflated with 7 mL of saline. Contrast injection confirms intraluminal positioning within the body of the stomach. IMPRESSION: Replacement of 16 French balloon retention gastrostomy tube under fluoroscopy. The patient is scheduled for elective  gastrocutaneous fistula closure with a special order closure plug in early June. Electronically Signed   By: Irish Lack M.D.   On: 01/26/2023 12:57    Labs:  CBC: Recent Labs    03/26/22 0351 05/26/22 1636 08/23/22 1504 02/21/23 0030  WBC 7.9 9.5 11.3* 9.7  HGB 10.5* 11.8* 12.4* 11.5*  HCT 32.8* 36.7* 39.4 35.8*  PLT 268 314.0 283 268    COAGS: Recent Labs    05/26/22 1636 02/21/23 0030  INR 1.0 1.1  APTT  --  33    BMP: Recent Labs    03/25/22 1840 03/26/22 0351 05/26/22 1636 12/08/22 1124 02/21/23 0030  NA 135 137 136  --  135  K 4.1 3.7 4.4  --  5.1  CL 98 104 101  --  99  CO2 24 25 26   --  21*  GLUCOSE 349* 202* 256*  --  256*  BUN 12 9 11   --  24*  CALCIUM 9.4 8.9 9.4  --  9.2  CREATININE 0.57* 0.54* 0.54 0.70 0.93  GFRNONAA >60 >60  --   --  >60    LIVER FUNCTION TESTS: Recent Labs    03/25/22 1840 02/21/23 0030  BILITOT 0.4 0.5  AST 16 20  ALT 14 24  ALKPHOS 119 78  PROT 6.5 5.9*  ALBUMIN 3.0* 2.9*    TUMOR MARKERS: No results for input(s): "AFPTM", "CEA", "CA199", "CHROMGRNA" in the last 8760 hours.  Assessment and Plan:  Scheduled today for possible enterocutaneous fistula plug closure with sedation in IR Pt is aware of procedure benefits and risks including but not limited to Infection; bleeding; damage to surrounding structures Agreeable to proceed Consent signed and in chart  Thank you for this interesting consult.  I greatly enjoyed meeting Jameire Hoots. and look forward to participating in their care.  A copy of this report was sent to the requesting provider on this date.  Electronically Signed: Robet Leu, PA-C 02/21/2023, 11:18 AM   I spent a total of 20 Minutes    in face to face in clinical consultation, greater than 50% of which was counseling/coordinating care for gastrocutaneous fistula closure

## 2023-02-21 NOTE — ED Notes (Signed)
,..ED TO INPATIENT HANDOFF REPORT  ED Nurse Name and Phone #: [phone]  S Name/Age/Gender Brent [name]. 77 y.o. male Room/Bed: 038C/038C  Code Status   Code Status: Full Code  Home/SNF/Other Home Patient oriented to: self, place, time, and situation Is this baseline? Yes   Triage Complete: Triage complete  Chief Complaint Dehydration [E86.0] Weakness [R53.1]  Triage Note Pt is coming in for weakness, is a quadriplegic, mentions he feels weaker than normal. Pt was a&o x4 per medic, pt mentions a bed sore on his sacrum that is bothering him, also mentions not eating breakfast or lunch.   Medic Vitals  116/64 60 99% 4l [place] O2 this is normal for him CBG 231  97.4    Allergies Allergies  Allergen Reactions   Gabapentin Other (See Comments)    Unknown   Levofloxacin Rash   Penicillins Hives and Rash    Tolerated Unasyn, Augmentin, cefepime and cephalexin    Level of Care/Admitting Diagnosis ED Disposition     ED Disposition  Admit   Condition  --   Comment  Hospital [facility] [[id number]]  Level of Care: Telemetry Medical [104]  May place patient in observation at [facility] or [facility] if equivalent level of care is available:: No  Covid Evaluation: Asymptomatic - no recent exposure (last 10 days) testing not required  Diagnosis: Weakness [241835]  Admitting Physician: Clydie Braun [[id number]]  Attending Physician: Clydie Braun [[id number]]          B Medical/Surgery History Past Medical History:  Diagnosis Date   Allergy    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 angioplasty of his RCA in 1990. b. bare metal stent placed in the RCA in 2000 followed by rotational atherectomy shortly after for stent restenosis. c. last cath was in 2012 showing stable moderate diffuse CAD. (70% mid LAD, 80% diagonal, 70% Ramus, 40% mid to distal RCA stent restenosis). d. Low risk nuc in 2015.    Diabetes mellitus    Diverticulosis    Elevated CK    Erectile dysfunction    Hemorrhoids    HTN (hypertension)    Hyperlipidemia    Hypertriglyceridemia    Malignant melanoma of left side of neck (HCC) 10/25/2018   Myocardial infarction (HCC)    Obesity    OSA (obstructive sleep apnea)    Persistent disorder of initiating or maintaining sleep    Personal history of colonic polyps 02/05/2003   Renal lesion 06/20/2016   Past Surgical History:  Procedure Laterality Date   BIOPSY  03/26/2022   Procedure: BIOPSY;  Surgeon: Iva Boop, MD;  Location: [facility] ENDOSCOPY;  Service: Gastroenterology;;   CHOLECYSTECTOMY     CORONARY ANGIOPLASTY     CORONARY STENT PLACEMENT     stenting of the right coronary artery with followup rotational  atherectomy. (3 stents placed)   ENDOSCOPIC MUCOSAL RESECTION  10/13/2022   Procedure: ENDOSCOPIC MUCOSAL RESECTION;  Surgeon: Meridee Score Netty Starring., MD;  Location: [facility] ENDOSCOPY;  Service: Gastroenterology;;   ESOPHAGOGASTRODUODENOSCOPY (EGD) WITH PROPOFOL N/A 03/26/2022   Procedure: ESOPHAGOGASTRODUODENOSCOPY (EGD) WITH PROPOFOL;  Surgeon: Iva Boop, MD;  Location: [facility] ENDOSCOPY;  Service: Gastroenterology;  Laterality: N/A;   ESOPHAGOGASTRODUODENOSCOPY (EGD) WITH PROPOFOL N/A 07/11/2022   Procedure: ESOPHAGOGASTRODUODENOSCOPY (EGD) WITH PROPOFOL;  Surgeon: Meridee Score Netty Starring., MD;  Location: [facility] ENDOSCOPY;  Service: Gastroenterology;  Laterality: N/A;   ESOPHAGOGASTRODUODENOSCOPY (EGD) WITH PROPOFOL N/A 10/13/2022  Procedure: ESOPHAGOGASTRODUODENOSCOPY (EGD) WITH PROPOFOL;  Surgeon: Meridee Score Netty Starring., MD;  Location: [facility] ENDOSCOPY;  Service: Gastroenterology;  Laterality: N/A;   EUS N/A 10/13/2022   Procedure: UPPER ENDOSCOPIC ULTRASOUND (EUS) RADIAL;  Surgeon: Lemar Lofty., MD;  Location: [facility] ENDOSCOPY;  Service: Gastroenterology;  Laterality: N/A;   FINGER SURGERY     right   FOOT SURGERY     right   HEMOSTASIS CLIP PLACEMENT   07/11/2022   Procedure: HEMOSTASIS CLIP PLACEMENT;  Surgeon: Meridee Score Netty Starring., MD;  Location: [facility] ENDOSCOPY;  Service: Gastroenterology;;  ovesco    HEMOSTASIS CLIP PLACEMENT  10/13/2022   Procedure: HEMOSTASIS CLIP PLACEMENT;  Surgeon: Lemar Lofty., MD;  Location: [facility] ENDOSCOPY;  Service: Gastroenterology;;   HOT HEMOSTASIS N/A 07/11/2022   Procedure: HOT HEMOSTASIS (ARGON PLASMA COAGULATION/BICAP);  Surgeon: Lemar Lofty., MD;  Location: [facility] ENDOSCOPY;  Service: Gastroenterology;  Laterality: N/A;   HOT HEMOSTASIS N/A 10/13/2022   Procedure: HOT HEMOSTASIS (ARGON PLASMA COAGULATION/BICAP);  Surgeon: Lemar Lofty., MD;  Location: [facility] ENDOSCOPY;  Service: Gastroenterology;  Laterality: N/A;   INGUINAL HERNIA REPAIR     right   IR FLUORO PROCEDURE UNLISTED  02/21/2023   IR GASTROSTOMY TUBE REMOVAL  02/10/2022   IR REPLC GASTRO/COLONIC TUBE PERCUT W/FLUORO  12/22/2022   IR REPLC GASTRO/COLONIC TUBE PERCUT W/FLUORO  01/26/2023   IRRIGATION AND DEBRIDEMENT ABSCESS N/A 06/04/2020   Procedure: IRRIGATION AND DEBRIDEMENT SACRAL WOUND;  Surgeon: Diamantina Monks, MD;  Location: [facility] OR;  Service: General;  Laterality: N/A;   LAPAROSCOPY N/A 06/04/2020   Procedure: LAPAROSCOPY ASSISTED COLOSTOMY, LAPAROSCOPY GASTROSTOMY;  Surgeon: Diamantina Monks, MD;  Location: [facility] OR;  Service: General;  Laterality: N/A;   LYSIS OF ADHESION N/A 06/04/2020   Procedure: LYSIS OF ADHESION;  Surgeon: Diamantina Monks, MD;  Location: [facility] OR;  Service: General;  Laterality: N/A;   melanoma removal     neck   ORTHOPEDIC SURGERY     foot right   POLYPECTOMY  10/13/2022   Procedure: POLYPECTOMY;  Surgeon: Lemar Lofty., MD;  Location: [facility] ENDOSCOPY;  Service: Gastroenterology;;   rotator cuff surg     Bil   STENT REMOVAL  10/13/2022   Procedure: CLIP REMOVAL;  Surgeon: Lemar Lofty., MD;  Location: [facility] ENDOSCOPY;  Service: Gastroenterology;;   SUBMUCOSAL LIFTING INJECTION  10/13/2022    Procedure: SUBMUCOSAL LIFTING INJECTION;  Surgeon: Lemar Lofty., MD;  Location: [facility] ENDOSCOPY;  Service: Gastroenterology;;   SUBMUCOSAL TATTOO INJECTION  10/13/2022   Procedure: SUBMUCOSAL TATTOO INJECTION;  Surgeon: Lemar Lofty., MD;  Location: [facility] ENDOSCOPY;  Service: Gastroenterology;;     A IV Location/Drains/Wounds Patient Lines/Drains/Airways Status     Active Line/Drains/Airways     Name Placement date Placement time Site Days   Peripheral IV 02/21/23 20 G 1.16" Anterior;Distal;Right;Upper Arm 02/21/23  0037  Arm  less than 1   Gastrostomy/Enterostomy Gastrostomy 16 Fr. LUQ 01/26/23  1237  LUQ  26   Colostomy LLQ --  --  LLQ  --   Urethral Catheter Emeline Gins., RN Non-latex 16 Fr. 08/06/21  0328  Non-latex  564   Pressure Injury 12/22/20 Sacrum Upper Stage 3 -  Full thickness tissue loss. Subcutaneous fat may be visible but bone, tendon or muscle are NOT exposed. not a pressure injury; Chronic full thickness wound 12/22/20  0400  -- 791   Pressure Injury 12/22/20 Sacrum Medial Stage 4 - Full thickness tissue loss with exposed bone, tendon or muscle. 12/22/20  0400  -- 791   Pressure Injury 12/22/20 Heel Left;Right Stage 2 -  Partial thickness loss of dermis presenting as a shallow open injury with a red, pink wound bed without slough. 12/22/20  0400  -- 791   Pressure Injury 12/22/20 Ankle Right;Left;Lateral Stage 1 -  Intact skin with non-blanchable redness of a localized area usually over a bony prominence. 12/22/20  0400  -- 791   Pressure Injury 12/22/20 Ischial tuberosity Right Unstageable - Full thickness tissue loss in which the base of the injury is covered by slough (yellow, tan, gray, green or brown) and/or eschar (tan, brown or black) in the wound bed. 12/22/20  0400  -- 791   Pressure Injury 12/22/20 Foot Anterior;Left Stage 2 -  Partial thickness loss of dermis presenting as a shallow open injury with a red, pink wound bed without slough. 12/22/20  0400   -- 791   Pressure Injury 08/09/21 Sacrum Stage 4 - Full thickness tissue loss with exposed bone, tendon or muscle. 08/09/21  --  -- 561   Pressure Injury 03/26/22 Ischial tuberosity Right 03/26/22  0930  -- 332   Wound / Incision (Open or Dehisced) 08/06/21 Other (Comment) Ischial tuberosity Posterior;Right healing stage 4 08/06/21  2000  Ischial tuberosity  564   Wound / Incision (Open or Dehisced) 08/07/21 Other (Comment) Leg Posterior;Right full thickness healing wounds 08/07/21  0430  Leg  563   Wound / Incision (Open or Dehisced) 08/07/21 Other (Comment) Heel Left deep tissue pressure injury 08/07/21  0430  Heel  563            Intake/Output Last 24 hours No intake or output data in the 24 hours ending 02/21/23 1644  Labs/Imaging Results for orders placed or performed during the hospital encounter of 02/20/23 (from the past 48 hour(s))  Lactic acid, plasma     Status: Abnormal   Collection Time: 02/21/23 12:30 AM  Result Value Ref Range   Lactic Acid, Venous 3.0 (HH) 0.5 - 1.9 mmol/L    Comment: CRITICAL RESULT CALLED TO, READ BACK BY AND VERIFIED WITH SETH WARD RN 02/21/23 0123 [place] Performed at [facility], [address] [place], [place] [address]   Comprehensive metabolic panel     Status: Abnormal   Collection Time: 02/21/23 12:30 AM  Result Value Ref Range   Sodium 135 135 - 145 mmol/L   Potassium 5.1 3.5 - 5.1 mmol/L   Chloride 99 98 - 111 mmol/L   CO2 21 (L) 22 - 32 mmol/L   Glucose, Bld 256 (H) 70 - 99 mg/dL    Comment: Glucose reference range applies only to samples taken after fasting for at least 8 hours.   BUN 24 (H) 8 - 23 mg/dL   Creatinine, Ser 6.04 0.61 - 1.24 mg/dL   Calcium 9.2 8.9 - 54.0 mg/dL   Total Protein 5.9 (L) 6.5 - 8.1 g/dL   Albumin 2.9 (L) 3.5 - 5.0 g/dL   AST 20 15 - 41 U/L   ALT 24 0 - 44 U/L   Alkaline Phosphatase 78 38 - 126 U/L   Total Bilirubin 0.5 0.3 - 1.2 mg/dL   GFR, Estimated >98 >11 mL/min    Comment:  (NOTE) Calculated using the CKD-EPI Creatinine Equation (2021)    Anion gap 15 5 - 15    Comment: Performed at [facility], [address], [place], [place] [address]  CBC with Differential     Status: Abnormal  Collection Time: 02/21/23 12:30 AM  Result Value Ref Range   WBC 9.7 4.0 - 10.5 K/uL   RBC 3.99 (L) 4.22 - 5.81 MIL/uL   Hemoglobin 11.5 (L) 13.0 - 17.0 g/dL   HCT 78.2 (L) 95.6 - 21.3 %   MCV 89.7 80.0 - 100.0 fL   MCH 28.8 26.0 - 34.0 pg   MCHC 32.1 30.0 - 36.0 g/dL   RDW 08.6 (H) 57.8 - 46.9 %   Platelets 268 150 - 400 K/uL   nRBC 0.0 0.0 - 0.2 %   Neutrophils Relative % 75 %   Neutro Abs 7.4 1.7 - 7.7 K/uL   Lymphocytes Relative 15 %   Lymphs Abs 1.4 0.7 - 4.0 K/uL   Monocytes Relative 7 %   Monocytes Absolute 0.7 0.1 - 1.0 K/uL   Eosinophils Relative 2 %   Eosinophils Absolute 0.2 0.0 - 0.5 K/uL   Basophils Relative 0 %   Basophils Absolute 0.0 0.0 - 0.1 K/uL   Immature Granulocytes 1 %   Abs Immature Granulocytes 0.05 0.00 - 0.07 K/uL    Comment: Performed at [facility], [address], [place], [place] [address]  Protime-INR     Status: None   Collection Time: 02/21/23 12:30 AM  Result Value Ref Range   Prothrombin Time 13.9 11.4 - 15.2 seconds   INR 1.1 0.8 - 1.2    Comment: (NOTE) INR goal varies based on device and disease states. Performed at [facility], [address], [place], [place] [address]   APTT     Status: None   Collection Time: 02/21/23 12:30 AM  Result Value Ref Range   aPTT 33 24 - 36 seconds    Comment: Performed at [facility], [address], [place], [place] [address]  Blood Culture (routine x 2)     Status: None (Preliminary result)   Collection Time: 02/21/23 12:30 AM   Specimen: BLOOD  Result Value Ref Range   Specimen Description BLOOD SITE NOT SPECIFIED    Special Requests      BOTTLES DRAWN AEROBIC AND ANAEROBIC Blood Culture results may not be optimal due to an excessive volume of blood  received in culture bottles   Culture      NO GROWTH < 24 HOURS Performed at [facility], [address], [place], [place] [address]    Report Status PENDING   Blood Culture (routine x 2)     Status: None (Preliminary result)   Collection Time: 02/21/23 12:30 AM   Specimen: BLOOD  Result Value Ref Range   Specimen Description BLOOD SITE NOT SPECIFIED    Special Requests      BOTTLES DRAWN AEROBIC AND ANAEROBIC Blood Culture results may not be optimal due to an excessive volume of blood received in culture bottles   Culture      NO GROWTH < 24 HOURS Performed at [facility], [address] [place], [place] [address]    Report Status PENDING   Urinalysis, Routine w reflex microscopic -Urine, Catheterized; Indwelling urinary catheter     Status: Abnormal   Collection Time: 02/21/23 12:30 AM  Result Value Ref Range   Color, Urine AMBER (A) YELLOW    Comment: BIOCHEMICALS MAY BE AFFECTED BY COLOR   APPearance TURBID (A) CLEAR   Specific Gravity, Urine 1.017 1.005 - 1.030   pH 5.0 5.0 - 8.0   Glucose, UA 50 (A) NEGATIVE mg/dL   Hgb  urine dipstick NEGATIVE NEGATIVE   Bilirubin Urine NEGATIVE NEGATIVE   Ketones, ur NEGATIVE NEGATIVE mg/dL   Protein, ur 161 (A) NEGATIVE mg/dL   Nitrite POSITIVE (A) NEGATIVE   Leukocytes,Ua MODERATE (A) NEGATIVE   RBC / HPF 0-5 0 - 5 RBC/hpf   WBC, UA >50 0 - 5 WBC/hpf   Bacteria, UA MANY (A) NONE SEEN   Squamous Epithelial / HPF 0-5 0 - 5 /HPF   WBC Clumps PRESENT    Mucus PRESENT     Comment: Performed at [facility], [address] [place], [place] [address]  Lactic acid, plasma     Status: Abnormal   Collection Time: 02/21/23  3:48 AM  Result Value Ref Range   Lactic Acid, Venous 2.9 (HH) 0.5 - 1.9 mmol/L    Comment: CRITICAL VALUE NOTED. VALUE IS CONSISTENT WITH PREVIOUSLY REPORTED/CALLED VALUE Performed at [facility] Lab, [address], [place], [place] [address]   Lactic acid, plasma     Status: Abnormal    Collection Time: 02/21/23  7:34 AM  Result Value Ref Range   Lactic Acid, Venous 2.5 (HH) 0.5 - 1.9 mmol/L    Comment: CRITICAL VALUE NOTED. VALUE IS CONSISTENT WITH PREVIOUSLY REPORTED/CALLED VALUE Performed at [facility], [address] [place], [place] [address]   CBG monitoring, ED     Status: Abnormal   Collection Time: 02/21/23  3:27 PM  Result Value Ref Range   Glucose-Capillary 262 (H) 70 - 99 mg/dL    Comment: Glucose reference range applies only to samples taken after fasting for at least 8 hours.   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 Briefly, 77 year old male with a history of surgically placed gastrostomy (06/04/2020), with subsequent removal on 02/10/2022. Patient with persistent drainage and gastrostomy tract with concern for GC fistula. Prior endoscopic attempts at closure with OTSC/Ovesco clips (07/11/2022 and 10/13/2022) were unsuccessful. EXAM: IR FLUORO PROCEDURE UNLISTED Procedures: 1. GASTROCUTANEOUS FISTULA EVALUATION 2. PERCUTANEOUS ENTEROCUTANEOUS FISTULA CLOSURE COMPARISON:  IR fluoroscopy, 12/22/2022 and 01/26/2023. CT AP, 12/08/2022. CONTRAST:  10 mL Omnipaque 300-administered via the percutaneous drainage catheter. MEDICATIONS: Ancef 1 g IV. Preprocedure antibiotics administered within 60 minutes of procedure. ANESTHESIA/SEDATION: Moderate (conscious) sedation was employed during this procedure. A total of Versed 2 mg and Fentanyl 100 mcg was administered intravenously. Moderate Sedation Time: 41 minutes. The patient's level of consciousness and vital signs were monitored continuously by radiology nursing throughout the procedure under my direct supervision. FLUOROSCOPY TIME:  Fluoroscopic dose; 42 mGy TECHNIQUE: Informed consent was obtained from the patient and/or patient's representative following explanation of the procedure, risks, benefits and alternatives. All questions were  addressed. A time out was performed prior to the initiation of the procedure. Maximal barrier sterile technique utilized including caps, mask, sterile gowns, sterile gloves, large sterile drape, hand hygiene, and sterile prep. The patient was positioned supine on the fluoroscopy table. The external portion of the existing gastrostomy/percutaneous catheter as well as the surrounding skin was prepped and draped in usual sterile fashion. A preprocedural spot fluoroscopic image was obtained of the existing percutaneous catheter. A small amount of contrast was injected via the existing percutaneous catheter and several fluoroscopic images were obtained in various obliquities. The external portion of the percutaneous catheter was cut and cannulated with a short Amplatz wire. Under intermittent fluoroscopic guidance, the existing percutaneous catheter was exchanged for a 9 Fr short sheath then a cytology brush  introduced over wire was used to debride the epithelialized catheter tract. The sheath and brush were removed. The tract was irrigated with saline. Serial tract dilation with dilators up to 22 Fr was performed. A Cook 22 French dry-type seal sheath was introduced through the tract into the stomach, then the Biodesign ECF plug was released in stomach then retracted to the abdominal wall. The plug was rehydrated with 40 mL saline, and the sheath was removed. The plug was cut to size and while under tension was secured by a Molnar disc to the anterior abdominal wall. Fluoroscopic images were acquired demonstrating plug apposition with appropriate tension. The patient tolerated the procedure well without immediate postprocedural complication. FINDINGS: 1. 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 2. Uncomplicated percutaneous ECF closure with [facility] plug. IMPRESSION: Successful percutaneous gastrocutaneous fistula closure with a Cook Biodesign enterocutaneous plug, as above. RECOMMENDATIONS: Post  Procedure Precautions:. Dietary restrictions. Patient MUST observe a liquid diet for the first 48 hours, followed by a high fiber diet. Expect some drainage for up to 16 weeks after the procedure. Stool softener is recommended. Use of over-the-counter strength pain medicine is OK after the procedure. No strenuous physical activity for at least 6 weeks after procedure. No lifting of items over 10 pounds for at least 6 weeks after procedure PLAN: The patient will return to Vascular Interventional Radiology (VIR) for re-evaluation at the ambulatory clinic in 6 weeks. Roanna Banning, MD [facility] Radiology Electronically Signed   By: Roanna Banning M.D.   On: 02/21/2023 13:58   CT HEAD WO CONTRAST ( )  Result Date: 02/21/2023 CLINICAL DATA:  Altered mental status EXAM: CT HEAD WITHOUT CONTRAST TECHNIQUE: Contiguous axial images were obtained from the base of the skull through the vertex without intravenous contrast. RADIATION DOSE REDUCTION: This exam was performed according to the departmental dose-optimization program which includes automated exposure control, adjustment of the mA and/or kV according to patient size and/or use of iterative reconstruction technique. COMPARISON:  08/06/2021 FINDINGS: Brain: No evidence of acute infarction, hemorrhage, hydrocephalus, extra-axial collection or mass lesion/mass effect. Stable arachnoid cyst is noted in the posterior fossa on the right. Mild atrophic changes are noted. Chronic white matter ischemic changes are seen as well. Vascular: No hyperdense vessel or unexpected calcification. Skull: Normal. Negative for fracture or focal lesion. Sinuses/Orbits: No acute finding. Other: None. IMPRESSION: Chronic changes without acute abnormality. Electronically Signed   By: Alcide Clever M.D.   On: 02/21/2023 01:33   DG Chest Port 1 View  Result Date: 02/21/2023 CLINICAL DATA:  Possible sepsis EXAM: PORTABLE CHEST 1 VIEW COMPARISON:   03/25/2022 FINDINGS: Cardiac shadow is enlarged. Lungs are clear bilaterally. No bony abnormality is seen. IMPRESSION: No active disease. Electronically Signed   By: Alcide Clever M.D.   On: 02/21/2023 01:10    Pending Labs Unresulted Labs (From admission, onward)     Start     Ordered   02/22/23 0500  CBC  Tomorrow morning,   R        02/21/23 0914   02/22/23 0500  Basic metabolic panel  Tomorrow morning,   R        02/21/23 0914   02/21/23 0921  C-reactive protein  Once,   R        02/21/23 0920   02/21/23 0915  Hemoglobin A1c  Once,   R       Comments: To assess prior glycemic control    02/21/23 0914   02/21/23  0800  Lactic acid, plasma  Once-Timed,   TIMED        02/21/23 0524   02/21/23 0003  Culture, Urine (Do not remove urinary catheter, catheter placed by urology or difficult to place)  (Undifferentiated presentation (screening labs and basic nursing orders))  Once,   URGENT       Question:  Indication  Answer:  Altered mental status (if no other cause identified)   02/21/23 0002            Vitals/Pain Today's Vitals   02/21/23 1606 02/21/23 1625 02/21/23 1630 02/21/23 1635  BP: (!) 179/59     Pulse: 65 64 63 61  Resp: 18 17 15 16   Temp:      TempSrc:      SpO2: 100% 100% 100% 100%  PainSc:        Isolation Precautions No active isolations  Medications Medications  acetaminophen (TYLENOL) tablet 650 mg (has no administration in time range)    Or  acetaminophen (TYLENOL) suppository 650 mg (has no administration in time range)  lactated ringers infusion (0 mLs Intravenous Stopped 02/21/23 1425)  DULoxetine (CYMBALTA) DR capsule 30 mg (has no administration in time range)  traZODone (DESYREL) tablet 75 mg (has no administration in time range)  baclofen (LIORESAL) tablet 10 mg (10 mg Oral Given 02/21/23 1540)  nystatin (MYCOSTATIN/NYSTOP) topical powder 1 Application (1 Application Topical Not Given 02/21/23 1551)  polyethylene glycol 0.4% and propylene glycol  0.3% (SYSTANE) ophthalmic gel (has no administration in time range)  enoxaparin (LOVENOX) injection 40 mg (has no administration in time range)  sodium chloride flush (NS) 0.9 % injection 3 mL (3 mLs Intravenous Not Given 02/21/23 1543)  albuterol (PROVENTIL) (2.5 MG/3ML) 0.083% nebulizer solution 2.5 mg (has no administration in time range)  ondansetron (ZOFRAN) tablet 4 mg (has no administration in time range)    Or  ondansetron (ZOFRAN) injection 4 mg (has no administration in time range)  insulin aspart (novoLOG) injection 0-20 Units (11 Units Subcutaneous Given 02/21/23 1543)  insulin aspart (novoLOG) injection 0-5 Units (has no administration in time range)  cefTRIAXone (ROCEPHIN) 2 g in sodium chloride 0.9 % 100 mL IVPB (0 g Intravenous Stopped 02/21/23 1643)  atenolol (TENORMIN) tablet 12.5 mg (12.5 mg Oral Given 02/21/23 1540)  atenolol (TENORMIN) tablet 25 mg (has no administration in time range)  lactated ringers bolus 1,000 mL (0 mLs Intravenous Stopped 02/21/23 0359)  ceFAZolin (ANCEF) IVPB 1 g/50 mL premix (0 g Intravenous Stopped 02/21/23 1425)  midazolam (VERSED) injection (1 mg Intravenous Given 02/21/23 1222)  iohexol (OMNIPAQUE) 300 MG/ML solution 50 mL (10 mLs Per Tube Contrast Given 02/21/23 1251)  fentaNYL (SUBLIMAZE) injection (50 mcg Intravenous Given 02/21/23 1233)    Mobility non-ambulatory     Focused Assessments Cardiac Assessment Handoff:  Cardiac Rhythm: Normal sinus rhythm Lab Results  Component Value Date   CKTOTAL 119 03/15/2020   CKMB 1.9 02/14/2011   TROPONINI  02/14/2011    <0.30        Due to the release kinetics of cTnI, a negative result within the first hours of the onset of symptoms does not rule out myocardial infarction with certainty. If myocardial infarction is still suspected, repeat the test at appropriate intervals. **Please note change in reference range.**   Lab Results  Component Value Date   DDIMER 0.74 (H) 11/01/2021   Does the Patient  currently have chest pain? No   , Neuro Assessment Handoff:  Swallow screen pass? Yes  Cardiac Rhythm: Normal sinus rhythm       Neuro Assessment:   Neuro Checks:      Has TPA been given? No If patient is a Neuro Trauma and patient is going to OR before floor call report to 4N Charge nurse: [phone] or [phone]   R Recommendations: See Admitting Provider Note  Report given to:   Additional Notes: A*ox4, quadrapelgic, colostomy, foley, VSS

## 2023-02-21 NOTE — ED Provider Notes (Signed)
[facility] AT [facility] Provider Note   CSN: [id number] Arrival date & time: 02/20/23  2323     History  Chief Complaint  Patient presents with   Weakness    Brent [name]. is a 77 y.o. male.  Patient presents to the emergency department for evaluation of weakness.  He thinks he might of had a fever earlier, family reports that he was somewhat delusional, possibly hallucinating.  Patient is quadriplegic secondary to transverse myelitis.  He has chronic sacral decubiti that are monitored at skilled nursing facility.  He does not have any other specific complaints, reports that he feels generally more weak than usual.  He does admit that he has not been eating or drinking for the last 24 hours or so, might be dehydrated.       Home Medications Prior to Admission medications   Medication Sig Start Date End Date Taking? Authorizing Provider  acetaminophen (TYLENOL) 500 MG tablet Take 500 mg by mouth every 6 (six) hours as needed for moderate pain.    [provider]  albuterol (PROVENTIL) (2.5 MG/3ML) 0.083% nebulizer solution Take 3 mLs (2.5 mg total) by nebulization every 6 (six) hours as needed for wheezing or shortness of breath (or anaphylaxis due to Rituxan infusion). 08/10/21   Pokhrel, Rebekah Chesterfield, MD  Alogliptin Benzoate 25 MG TABS Take 25 mg by mouth daily.    [provider]  Amino Acids-Protein Hydrolys (FEEDING SUPPLEMENT, PRO-STAT 64,) LIQD Take 30 mLs by mouth in the morning and at bedtime.    [provider]  atenolol (TENORMIN) 25 MG tablet Take 25 mg by mouth at bedtime.    [provider]  atorvastatin (LIPITOR) 20 MG tablet Take 1 tablet (20 mg total) by mouth daily. Patient taking differently: Take 20 mg by mouth at bedtime. 03/26/20   Burnadette Pop, MD  azelastine (OPTIVAR) 0.05 % ophthalmic solution Place 1 drop into both eyes 2 (two) times daily as needed (allergies).    [provider]   baclofen (LIORESAL) 10 MG tablet Take 10 mg by mouth 3 (three) times daily.    [provider]  cephALEXin (KEFLEX) 250 MG capsule Take 250 mg by mouth daily.    [provider]  cholecalciferol (VITAMIN D3) 25 MCG (1000 UNIT) tablet Take 3,000 Units by mouth daily.    [provider]  famotidine (PEPCID) 20 MG tablet Take 20 mg by mouth 2 (two) times daily.    [provider]  fexofenadine (ALLEGRA) 180 MG tablet Take 180 mg by mouth daily.    [provider]  folic acid (FOLVITE) 400 MCG tablet Take 400 mcg by mouth daily.    [provider]  gentamicin cream (GARAMYCIN) 0.1 % Apply 1 Application topically daily. Apply dime size thickness to Sacrum and ulcer, Right Ischium ulcer 10/05/22   [provider]  insulin aspart (NOVOLOG) 100 UNIT/ML injection Inject 0-15 Units into the skin every 4 (four) hours. Sliding scale  CBG 70 - 120: 0 units: CBG 121 - 150: 2 units; CBG 151 - 200: 3 units; CBG 201 - 250: 5 units; CBG 251 - 300: 8 units;CBG 301 - 350: 11 units; CBG 351 - 400: 15 units; CBG > 400 : 15 units and notify MD Patient taking differently: Inject 18 Units into the skin 3 (three) times daily before meals. 06/10/20   Rai, Ripudeep K, MD  insulin glargine (LANTUS) 100 unit/mL SOPN Inject 15 Units into the skin  at bedtime. Patient taking differently: Inject 72 Units into the skin daily. 06/10/20   Rai, Delene Ruffini, MD  losartan (COZAAR) 50 MG tablet Take 50 mg by mouth daily.    [provider]  magnesium hydroxide (MILK OF MAGNESIA) 400 MG/5ML suspension Take 30 mLs by mouth daily as needed for mild constipation.    [provider]  Magnesium Oxide, Elemental, 400 MG TABS Take 400 mg by mouth 3 (three) times daily.    [provider]  melatonin 3 MG TABS tablet Take 3 mg by mouth at bedtime. Apply to peri-wound bed to Sacrum and right ischium with daily routine dressing change    [provider]   Menthol, Topical Analgesic, (BIOFREEZE) 4 % GEL Apply 1 application  topically 2 (two) times daily as needed (Pain to back upper and lower arms and shoulders).    [provider]  metFORMIN (GLUCOPHAGE) 500 MG tablet Take 500 mg by mouth 2 (two) times daily with a meal.    [provider]  nystatin powder Apply 1 Application topically daily.    [provider]  omeprazole (PRILOSEC) 40 MG capsule Take 1 capsule (40 mg total) by mouth 2 (two) times daily. 30 minutes before breakfast/dinner 10/13/22   Mansouraty, Netty Starring., MD  ondansetron (ZOFRAN) 4 MG tablet Take 4 mg by mouth every 8 (eight) hours as needed for nausea.    [provider]  phenol (CHLORASEPTIC) 1.4 % LIQD Use as directed 2 sprays in the mouth or throat every 4 (four) hours as needed for throat irritation / pain.    [provider]  Polyethyl Glycol-Propyl Glycol (GENTEAL TEARS SEVERE DAY/NIGHT) 0.4-0.3 % GEL ophthalmic gel Place 1 Application into both eyes 4 (four) times daily as needed (Dry Eyes).    [provider]  PSYLLIUM PO Take 0.8 g by mouth in the morning and at bedtime.    [provider]  senna-docusate (SENOKOT-S) 8.6-50 MG tablet Take 1 tablet by mouth 2 (two) times daily.    [provider]  sertraline (ZOLOFT) 25 MG tablet Take 25 mg by mouth daily.    [provider]  spironolactone (ALDACTONE) 25 MG tablet Take 25 mg by mouth daily.    [provider]  traMADol (ULTRAM) 50 MG tablet Take 50 mg by mouth every 6 (six) hours as needed (Pain).    [provider]  traZODone (DESYREL) 50 MG tablet Take 75 mg by mouth at bedtime.    [provider]  vitamin B-12 (CYANOCOBALAMIN) 1000 MCG tablet Take 1,000 mcg by mouth daily.    [provider]  vitamin C (ASCORBIC ACID) 500 MG tablet Take 500 mg by mouth daily.    [provider]      Allergies    Gabapentin, Levofloxacin, and Penicillins     Review of Systems   Review of Systems  Physical Exam Updated Vital Signs BP 111/62   Pulse 80   Temp 98.1 F (36.7 C) (Oral)   Resp 20   SpO2 100%  Physical Exam Vitals and nursing note reviewed.  Constitutional:      General: He is not in acute distress.    Appearance: He is well-developed.  HENT:     Head: Normocephalic and atraumatic.     Mouth/Throat:     Mouth: Mucous membranes are moist.  Eyes:     General: Vision grossly intact. Gaze aligned appropriately.     Extraocular Movements: Extraocular movements intact.  Conjunctiva/sclera: Conjunctivae normal.  Cardiovascular:     Rate and Rhythm: Normal rate and regular rhythm.     Pulses: Normal pulses.     Heart sounds: Normal heart sounds, S1 normal and S2 normal. No murmur heard.    No friction rub. No gallop.  Pulmonary:     Effort: Pulmonary effort is normal. No respiratory distress.     Breath sounds: Normal breath sounds.  Abdominal:     Palpations: Abdomen is soft.     Tenderness: There is no abdominal tenderness. There is no guarding or rebound.     Hernia: No hernia is present.  Musculoskeletal:        General: No swelling.     Cervical back: Full passive range of motion without pain, normal range of motion and neck supple. No pain with movement, spinous process tenderness or muscular tenderness. Normal range of motion.     Right lower leg: No edema.     Left lower leg: No edema.  Skin:    General: Skin is warm and dry.     Capillary Refill: Capillary refill takes less than 2 seconds.     Findings: No ecchymosis, erythema, lesion or wound.  Neurological:     Mental Status: He is alert and oriented to person, place, and time.     GCS: GCS eye subscore is 4. GCS verbal subscore is 5. GCS motor subscore is 6.     Cranial Nerves: Cranial nerves 2-12 are intact.     Motor: No weakness or abnormal muscle tone.     Comments: Chronic quadriplegia present -patient has preserved proximal gross motor   Psychiatric:        Mood and Affect: Mood normal.        Speech: Speech normal.        Behavior: Behavior normal.     ED Results / Procedures / Treatments   Labs (all labs ordered are listed, but only abnormal results are displayed) Labs Reviewed  LACTIC ACID, PLASMA - Abnormal; Notable for the following components:      Result Value   Lactic Acid, Venous 3.0 (*)    All other components within normal limits  COMPREHENSIVE METABOLIC PANEL - Abnormal; Notable for the following components:   CO2 21 (*)    Glucose, Bld 256 (*)    BUN 24 (*)    Total Protein 5.9 (*)    Albumin 2.9 (*)    All other components within normal limits  CBC WITH DIFFERENTIAL/PLATELET - Abnormal; Notable for the following components:   RBC 3.99 (*)    Hemoglobin 11.5 (*)    HCT 35.8 (*)    RDW 15.9 (*)    All other components within normal limits  URINALYSIS, ROUTINE W REFLEX MICROSCOPIC - Abnormal; Notable for the following components:   Color, Urine AMBER (*)    APPearance TURBID (*)    Glucose, UA 50 (*)    Protein, ur 100 (*)    Nitrite POSITIVE (*)    Leukocytes,Ua MODERATE (*)    Bacteria, UA MANY (*)    All other components within normal limits  LACTIC ACID, PLASMA - Abnormal; Notable for the following components:   Lactic Acid, Venous 2.9 (*)    All other components within normal limits  CULTURE, BLOOD (ROUTINE X 2)  CULTURE, BLOOD (ROUTINE X 2)  URINE CULTURE  PROTIME-INR  APTT  LACTIC ACID, PLASMA  LACTIC ACID, PLASMA  LACTIC ACID, PLASMA    EKG None  Radiology CT HEAD WO CONTRAST ( )  Result Date: 02/21/2023 CLINICAL DATA:  Altered mental status EXAM: CT HEAD WITHOUT CONTRAST TECHNIQUE: Contiguous axial images were obtained from the base of the skull through the vertex without intravenous contrast. RADIATION DOSE REDUCTION: This exam was performed according to the departmental dose-optimization program which includes automated exposure control, adjustment of the mA and/or kV  according to patient size and/or use of iterative reconstruction technique. COMPARISON:  08/06/2021 FINDINGS: Brain: No evidence of acute infarction, hemorrhage, hydrocephalus, extra-axial collection or mass lesion/mass effect. Stable arachnoid cyst is noted in the posterior fossa on the right. Mild atrophic changes are noted. Chronic white matter ischemic changes are seen as well. Vascular: No hyperdense vessel or unexpected calcification. Skull: Normal. Negative for fracture or focal lesion. Sinuses/Orbits: No acute finding. Other: None. IMPRESSION: Chronic changes without acute abnormality. Electronically Signed   By: Alcide Clever M.D.   On: 02/21/2023 01:33   DG Chest Port 1 View  Result Date: 02/21/2023 CLINICAL DATA:  Possible sepsis EXAM: PORTABLE CHEST 1 VIEW COMPARISON:  03/25/2022 FINDINGS: Cardiac shadow is enlarged. Lungs are clear bilaterally. No bony abnormality is seen. IMPRESSION: No active disease. Electronically Signed   By: Alcide Clever M.D.   On: 02/21/2023 01:10    Procedures Procedures    Medications Ordered in ED Medications  acetaminophen (TYLENOL) tablet 650 mg (has no administration in time range)    Or  acetaminophen (TYLENOL) suppository 650 mg (has no administration in time range)  lactated ringers infusion ( Intravenous New Bag/Given 02/21/23 0544)  lactated ringers bolus 1,000 mL (0 mLs Intravenous Stopped 02/21/23 0359)    ED Course/ Medical Decision Making/ A&P                             Medical Decision Making Amount and/or Complexity of Data Reviewed Labs: ordered. Decision-making details documented in ED Course. Radiology: ordered and independent interpretation performed. Decision-making details documented in ED Course. ECG/medicine tests: ordered and independent interpretation performed. Decision-making details documented in ED Course.  Risk Decision regarding hospitalization.   Patient with history of neuromyelitis optica and transverse myelitis,  maintained on Rituxan, presents with generalized weakness.  Family reports that he seemed confused earlier, is improved but not 100% back to his baseline.  Patient does have a chronic sacral decub.  I did visualize this and it appears extremely well cared for, nothing that looks like active infection.  Patient has elevated lactic acid with normal white blood cell count.  No fever.  Urinalysis is abnormal.  Significance of this is unclear.  Reading through his records he does have a history of recurrent UTI that has been treated in the past.  In fact, the last time his lactic acid was elevated was in 2022 and at that time he was treated for UTI.  He has, however, had similar urinalysis since then that was not treated.  The only culture I have was from a couple of years ago and grew Pseudomonas, questionable pathogen.  Patient did appear dehydrated, given IV fluids.  Second lactic acid after a liter of fluids came back at 2.9.  Discussed with Dr. Arlean Hopping, hospitalist service.  Due to patient's immunocompromise status, persistent lactic acid elevation, will observe patient.        Final Clinical Impression(s) / ED Diagnoses Final diagnoses:  Lactic acidosis    Rx / DC Orders ED Discharge Orders     None  Gilda Crease, MD 02/21/23 [phone]

## 2023-02-21 NOTE — ED Notes (Signed)
ED TO INPATIENT HANDOFF REPORT  ED Nurse Name and Phone #: [phone]   S Name/Age/Gender Brent [name] 77 y.o. male Room/Bed: H012C/H012C  Code Status   Code Status: Full Code  Home/SNF/Other Home Patient oriented to: self, place, time, and situation Is this baseline? Yes   Triage Complete: Triage complete  Chief Complaint Dehydration [E86.0]  Triage Note Pt is coming in for weakness, is a quadriplegic, mentions he feels weaker than normal. Pt was a&o x4 per medic, pt mentions a bed sore on his sacrum that is bothering him, also mentions not eating breakfast or lunch.   Medic Vitals  116/64 60 99% 4l [place] O2 this is normal for him CBG 231  97.4    Allergies Allergies  Allergen Reactions   Gabapentin Other (See Comments)    Unknown   Levofloxacin Rash   Penicillins Hives and Rash    Tolerated Unasyn, Augmentin, cefepime and cephalexin    Level of Care/Admitting Diagnosis ED Disposition     ED Disposition  Admit   Condition  --   Comment  Hospital Area: [facility] [[id number]]  Level of Care: Med-Surg [16]  May place patient in observation at [facility] or [facility] if equivalent level of care is available:: Yes  Covid Evaluation: Asymptomatic - no recent exposure (last 10 days) testing not required  Diagnosis: Dehydration [276.51.ICD-9-CM]  Admitting Physician: Angie Fava [[id number]]  Attending Physician: Angie Fava [[id number]]          B Medical/Surgery History Past Medical History:  Diagnosis Date   Allergy    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 angioplasty of his RCA in 1990. b. bare metal stent placed in the RCA in 2000 followed by rotational atherectomy shortly after for stent restenosis. c. last cath was in 2012 showing stable moderate diffuse CAD. (70% mid LAD, 80% diagonal, 70% Ramus, 40% mid to distal RCA stent restenosis). d. Low risk nuc in 2015.    Diabetes mellitus    Diverticulosis    Elevated CK    Erectile dysfunction    Hemorrhoids    HTN (hypertension)    Hyperlipidemia    Hypertriglyceridemia    Malignant melanoma of left side of neck (HCC) 10/25/2018   Myocardial infarction (HCC)    Obesity    OSA (obstructive sleep apnea)    Persistent disorder of initiating or maintaining sleep    Personal history of colonic polyps 02/05/2003   Renal lesion 06/20/2016   Past Surgical History:  Procedure Laterality Date   BIOPSY  03/26/2022   Procedure: BIOPSY;  Surgeon: Iva Boop, MD;  Location: [facility] ENDOSCOPY;  Service: Gastroenterology;;   CHOLECYSTECTOMY     CORONARY ANGIOPLASTY     CORONARY STENT PLACEMENT     stenting of the right coronary artery with followup rotational  atherectomy. (3 stents placed)   ENDOSCOPIC MUCOSAL RESECTION  10/13/2022   Procedure: ENDOSCOPIC MUCOSAL RESECTION;  Surgeon: Meridee Score Netty Starring., MD;  Location: [facility] ENDOSCOPY;  Service: Gastroenterology;;   ESOPHAGOGASTRODUODENOSCOPY (EGD) WITH PROPOFOL N/A 03/26/2022   Procedure: ESOPHAGOGASTRODUODENOSCOPY (EGD) WITH PROPOFOL;  Surgeon: Iva Boop, MD;  Location: [facility] ENDOSCOPY;  Service: Gastroenterology;  Laterality: N/A;   ESOPHAGOGASTRODUODENOSCOPY (EGD) WITH PROPOFOL N/A 07/11/2022   Procedure: ESOPHAGOGASTRODUODENOSCOPY (EGD) WITH PROPOFOL;  Surgeon: Meridee Score Netty Starring., MD;  Location: [facility] ENDOSCOPY;  Service: Gastroenterology;  Laterality: N/A;   ESOPHAGOGASTRODUODENOSCOPY (EGD) WITH PROPOFOL N/A 10/13/2022   Procedure:  ESOPHAGOGASTRODUODENOSCOPY (EGD) WITH PROPOFOL;  Surgeon: Mansouraty, Netty Starring., MD;  Location: [facility] ENDOSCOPY;  Service: Gastroenterology;  Laterality: N/A;   EUS N/A 10/13/2022   Procedure: UPPER ENDOSCOPIC ULTRASOUND (EUS) RADIAL;  Surgeon: Lemar Lofty., MD;  Location: [facility] ENDOSCOPY;  Service: Gastroenterology;  Laterality: N/A;   FINGER SURGERY     right   FOOT SURGERY     right   HEMOSTASIS CLIP PLACEMENT   07/11/2022   Procedure: HEMOSTASIS CLIP PLACEMENT;  Surgeon: Meridee Score Netty Starring., MD;  Location: [facility] ENDOSCOPY;  Service: Gastroenterology;;  ovesco    HEMOSTASIS CLIP PLACEMENT  10/13/2022   Procedure: HEMOSTASIS CLIP PLACEMENT;  Surgeon: Lemar Lofty., MD;  Location: [facility] ENDOSCOPY;  Service: Gastroenterology;;   HOT HEMOSTASIS N/A 07/11/2022   Procedure: HOT HEMOSTASIS (ARGON PLASMA COAGULATION/BICAP);  Surgeon: Lemar Lofty., MD;  Location: [facility] ENDOSCOPY;  Service: Gastroenterology;  Laterality: N/A;   HOT HEMOSTASIS N/A 10/13/2022   Procedure: HOT HEMOSTASIS (ARGON PLASMA COAGULATION/BICAP);  Surgeon: Lemar Lofty., MD;  Location: [facility] ENDOSCOPY;  Service: Gastroenterology;  Laterality: N/A;   INGUINAL HERNIA REPAIR     right   IR GASTROSTOMY TUBE REMOVAL  02/10/2022   IR REPLC GASTRO/COLONIC TUBE PERCUT W/FLUORO  12/22/2022   IR REPLC GASTRO/COLONIC TUBE PERCUT W/FLUORO  01/26/2023   IRRIGATION AND DEBRIDEMENT ABSCESS N/A 06/04/2020   Procedure: IRRIGATION AND DEBRIDEMENT SACRAL WOUND;  Surgeon: Diamantina Monks, MD;  Location: [facility] OR;  Service: General;  Laterality: N/A;   LAPAROSCOPY N/A 06/04/2020   Procedure: LAPAROSCOPY ASSISTED COLOSTOMY, LAPAROSCOPY GASTROSTOMY;  Surgeon: Diamantina Monks, MD;  Location: [facility] OR;  Service: General;  Laterality: N/A;   LYSIS OF ADHESION N/A 06/04/2020   Procedure: LYSIS OF ADHESION;  Surgeon: Diamantina Monks, MD;  Location: [facility] OR;  Service: General;  Laterality: N/A;   melanoma removal     neck   ORTHOPEDIC SURGERY     foot right   POLYPECTOMY  10/13/2022   Procedure: POLYPECTOMY;  Surgeon: Lemar Lofty., MD;  Location: [facility] ENDOSCOPY;  Service: Gastroenterology;;   rotator cuff surg     Bil   STENT REMOVAL  10/13/2022   Procedure: CLIP REMOVAL;  Surgeon: Lemar Lofty., MD;  Location: [facility] ENDOSCOPY;  Service: Gastroenterology;;   SUBMUCOSAL LIFTING INJECTION  10/13/2022   Procedure: SUBMUCOSAL LIFTING INJECTION;   Surgeon: Lemar Lofty., MD;  Location: [facility]OS[facility];  Service: Gastroenterology;;   SUBMUCOSAL TATTOO INJECTION  10/13/2022   Procedure: SUBMUCOSAL TATTOO INJECTION;  Surgeon: Lemar Lofty., MD;  Location: [facility] ENDOSCOPY;  Service: Gastroenterology;;     A IV Location/Drains/Wounds Patient Lines/Drains/Airways Status     Active Line/Drains/Airways     Name Placement date Placement time Site Days   Peripheral IV 02/21/23 20 G 1.16" Anterior;Distal;Right;Upper Arm 02/21/23  0037  Arm  less than 1   Gastrostomy/Enterostomy Gastrostomy 16 Fr. LUQ 01/26/23  1237  LUQ  26   Colostomy LLQ --  --  LLQ  --   Urethral Catheter Emeline Gins., RN Non-latex 16 Fr. 08/06/21  0328  Non-latex  564   Pressure Injury 12/22/20 Sacrum Upper Stage 3 -  Full thickness tissue loss. Subcutaneous fat may be visible but bone, tendon or muscle are NOT exposed. not a pressure injury; Chronic full thickness wound 12/22/20  0400  -- 791   Pressure Injury 12/22/20 Sacrum Medial Stage 4 - Full thickness tissue loss with exposed bone, tendon or muscle. 12/22/20  0400  -- 791   Pressure Injury 12/22/20  Heel Left;Right Stage 2 -  Partial thickness loss of dermis presenting as a shallow open injury with a red, pink wound bed without slough. 12/22/20  0400  -- 791   Pressure Injury 12/22/20 Ankle Right;Left;Lateral Stage 1 -  Intact skin with non-blanchable redness of a localized area usually over a bony prominence. 12/22/20  0400  -- 791   Pressure Injury 12/22/20 Ischial tuberosity Right Unstageable - Full thickness tissue loss in which the base of the injury is covered by slough (yellow, tan, gray, green or brown) and/or eschar (tan, brown or black) in the wound bed. 12/22/20  0400  -- 791   Pressure Injury 12/22/20 Foot Anterior;Left Stage 2 -  Partial thickness loss of dermis presenting as a shallow open injury with a red, pink wound bed without slough. 12/22/20  0400  -- 791   Pressure Injury 08/09/21 Sacrum  Stage 4 - Full thickness tissue loss with exposed bone, tendon or muscle. 08/09/21  --  -- 561   Pressure Injury 03/26/22 Ischial tuberosity Right 03/26/22  0930  -- 332   Wound / Incision (Open or Dehisced) 08/06/21 Other (Comment) Ischial tuberosity Posterior;Right healing stage 4 08/06/21  2000  Ischial tuberosity  564   Wound / Incision (Open or Dehisced) 08/07/21 Other (Comment) Leg Posterior;Right full thickness healing wounds 08/07/21  0430  Leg  563   Wound / Incision (Open or Dehisced) 08/07/21 Other (Comment) Heel Left deep tissue pressure injury 08/07/21  0430  Heel  563            Intake/Output Last 24 hours No intake or output data in the 24 hours ending 02/21/23 1610  Labs/Imaging Results for orders placed or performed during the hospital encounter of 02/20/23 (from the past 48 hour(s))  Lactic acid, plasma     Status: Abnormal   Collection Time: 02/21/23 12:30 AM  Result Value Ref Range   Lactic Acid, Venous 3.0 (HH) 0.5 - 1.9 mmol/L    Comment: CRITICAL RESULT CALLED TO, READ BACK BY AND VERIFIED WITH SETH WARD RN 02/21/23 0123 Enid Derry Performed at [facility], [address], [place], [place] [address]   Comprehensive metabolic panel     Status: Abnormal   Collection Time: 02/21/23 12:30 AM  Result Value Ref Range   Sodium 135 135 - 145 mmol/L   Potassium 5.1 3.5 - 5.1 mmol/L   Chloride 99 98 - 111 mmol/L   CO2 21 (L) 22 - 32 mmol/L   Glucose, Bld 256 (H) 70 - 99 mg/dL    Comment: Glucose reference range applies only to samples taken after fasting for at least 8 hours.   BUN 24 (H) 8 - 23 mg/dL   Creatinine, Ser 4.09 0.61 - 1.24 mg/dL   Calcium 9.2 8.9 - 81.1 mg/dL   Total Protein 5.9 (L) 6.5 - 8.1 g/dL   Albumin 2.9 (L) 3.5 - 5.0 g/dL   AST 20 15 - 41 U/L   ALT 24 0 - 44 U/L   Alkaline Phosphatase 78 38 - 126 U/L   Total Bilirubin 0.5 0.3 - 1.2 mg/dL   GFR, Estimated >91 >47 mL/min    Comment: (NOTE) Calculated using the CKD-EPI Creatinine  Equation (2021)    Anion gap 15 5 - 15    Comment: Performed at [facility], [address], [place], [place] [address]  CBC with Differential     Status: Abnormal   Collection Time: 02/21/23 12:30 AM  Result  Value Ref Range   WBC 9.7 4.0 - 10.5 K/uL   RBC 3.99 (L) 4.22 - 5.81 MIL/uL   Hemoglobin 11.5 (L) 13.0 - 17.0 g/dL   HCT 82.9 (L) 56.2 - 13.0 %   MCV 89.7 80.0 - 100.0 fL   MCH 28.8 26.0 - 34.0 pg   MCHC 32.1 30.0 - 36.0 g/dL   RDW 86.5 (H) 78.4 - 69.6 %   Platelets 268 150 - 400 K/uL   nRBC 0.0 0.0 - 0.2 %   Neutrophils Relative % 75 %   Neutro Abs 7.4 1.7 - 7.7 K/uL   Lymphocytes Relative 15 %   Lymphs Abs 1.4 0.7 - 4.0 K/uL   Monocytes Relative 7 %   Monocytes Absolute 0.7 0.1 - 1.0 K/uL   Eosinophils Relative 2 %   Eosinophils Absolute 0.2 0.0 - 0.5 K/uL   Basophils Relative 0 %   Basophils Absolute 0.0 0.0 - 0.1 K/uL   Immature Granulocytes 1 %   Abs Immature Granulocytes 0.05 0.00 - 0.07 K/uL    Comment: Performed at [facility], [address], [place], [place] [address]  Protime-INR     Status: None   Collection Time: 02/21/23 12:30 AM  Result Value Ref Range   Prothrombin Time 13.9 11.4 - 15.2 seconds   INR 1.1 0.8 - 1.2    Comment: (NOTE) INR goal varies based on device and disease states. Performed at [facility], [address] [place], [place] [address]   APTT     Status: None   Collection Time: 02/21/23 12:30 AM  Result Value Ref Range   aPTT 33 24 - 36 seconds    Comment: Performed at [facility], [address], [place], [place] [address]  Urinalysis, Routine w reflex microscopic -Urine, Catheterized; Indwelling urinary catheter     Status: Abnormal   Collection Time: 02/21/23 12:30 AM  Result Value Ref Range   Color, Urine AMBER (A) YELLOW    Comment: BIOCHEMICALS MAY BE AFFECTED BY COLOR   APPearance TURBID (A) CLEAR   Specific Gravity, Urine 1.017 1.005 - 1.030   pH 5.0 5.0 - 8.0   Glucose, UA 50 (A) NEGATIVE  mg/dL   Hgb urine dipstick NEGATIVE NEGATIVE   Bilirubin Urine NEGATIVE NEGATIVE   Ketones, ur NEGATIVE NEGATIVE mg/dL   Protein, ur 725 (A) NEGATIVE mg/dL   Nitrite POSITIVE (A) NEGATIVE   Leukocytes,Ua MODERATE (A) NEGATIVE   RBC / HPF 0-5 0 - 5 RBC/hpf   WBC, UA >50 0 - 5 WBC/hpf   Bacteria, UA MANY (A) NONE SEEN   Squamous Epithelial / HPF 0-5 0 - 5 /HPF   WBC Clumps PRESENT    Mucus PRESENT     Comment: Performed at [facility], [address], [place], [place] [address]  Lactic acid, plasma     Status: Abnormal   Collection Time: 02/21/23  3:48 AM  Result Value Ref Range   Lactic Acid, Venous 2.9 (HH) 0.5 - 1.9 mmol/L    Comment: CRITICAL VALUE NOTED. VALUE IS CONSISTENT WITH PREVIOUSLY REPORTED/CALLED VALUE Performed at [facility], [address] [place], [place] [address]    CT HEAD WO CONTRAST ( )  Result Date: 02/21/2023 CLINICAL DATA:  Altered mental status EXAM: CT HEAD WITHOUT CONTRAST TECHNIQUE: Contiguous axial images were obtained from the base of the skull through the vertex without intravenous contrast. RADIATION DOSE REDUCTION: This exam was performed according to the  departmental dose-optimization program which includes automated exposure control, adjustment of the mA and/or kV according to patient size and/or use of iterative reconstruction technique. COMPARISON:  08/06/2021 FINDINGS: Brain: No evidence of acute infarction, hemorrhage, hydrocephalus, extra-axial collection or mass lesion/mass effect. Stable arachnoid cyst is noted in the posterior fossa on the right. Mild atrophic changes are noted. Chronic white matter ischemic changes are seen as well. Vascular: No hyperdense vessel or unexpected calcification. Skull: Normal. Negative for fracture or focal lesion. Sinuses/Orbits: No acute finding. Other: None. IMPRESSION: Chronic changes without acute abnormality. Electronically Signed   By: Alcide Clever M.D.   On: 02/21/2023 01:33   DG Chest Port 1  View  Result Date: 02/21/2023 CLINICAL DATA:  Possible sepsis EXAM: PORTABLE CHEST 1 VIEW COMPARISON:  03/25/2022 FINDINGS: Cardiac shadow is enlarged. Lungs are clear bilaterally. No bony abnormality is seen. IMPRESSION: No active disease. Electronically Signed   By: Alcide Clever M.D.   On: 02/21/2023 01:10    Pending Labs Unresulted Labs (From admission, onward)     Start     Ordered   02/21/23 0800  Lactic acid, plasma  Once-Timed,   TIMED        02/21/23 0524   02/21/23 0003  Culture, Urine (Do not remove urinary catheter, catheter placed by urology or difficult to place)  (Undifferentiated presentation (screening labs and basic nursing orders))  Once,   URGENT       Question:  Indication  Answer:  Altered mental status (if no other cause identified)   02/21/23 0002   02/21/23 0002  Lactic acid, plasma  (Undifferentiated presentation (screening labs and basic nursing orders))  Now then every 2 hours,   R      02/21/23 0002   02/21/23 0002  Blood Culture (routine x 2)  (Undifferentiated presentation (screening labs and basic nursing orders))  BLOOD CULTURE X 2,   STAT      02/21/23 0002            Vitals/Pain Today's Vitals   02/21/23 0500 02/21/23 0559 02/21/23 0630 02/21/23 0645  BP: 111/62     Pulse: 80  80 77  Resp: 20  19 18   Temp:  98.1 F (36.7 C)    TempSrc:  Oral    SpO2: 100%  100% 100%    Isolation Precautions No active isolations  Medications Medications  acetaminophen (TYLENOL) tablet 650 mg (has no administration in time range)    Or  acetaminophen (TYLENOL) suppository 650 mg (has no administration in time range)  lactated ringers infusion ( Intravenous New Bag/Given 02/21/23 0544)  lactated ringers bolus 1,000 mL (0 mLs Intravenous Stopped 02/21/23 0359)    Mobility non-ambulatory     Focused Assessments Cardiac Assessment Handoff:    Lab Results  Component Value Date   CKTOTAL 119 03/15/2020   CKMB 1.9 02/14/2011   TROPONINI  02/14/2011     <0.30        Due to the release kinetics of cTnI, a negative result within the first hours of the onset of symptoms does not rule out myocardial infarction with certainty. If myocardial infarction is still suspected, repeat the test at appropriate intervals. **Please note change in reference range.**   Lab Results  Component Value Date   DDIMER 0.74 (H) 11/01/2021   Does the Patient currently have chest pain? No   , Neuro Assessment Handoff:             , Pulmonary Assessment Handoff:  Lung  sounds:   O2 Device: Nasal Cannula O2 Flow Rate (L/min): 4 L/min    R Recommendations: See Admitting Provider Note  Report given to:

## 2023-02-21 NOTE — Progress Notes (Signed)
  Carryover admission to the Day Admitter.  I discussed this case with the EDP, Dr. Blinda Leatherwood.  Per these discussions:   This is a 77 year old male with transverse myelitis resulting in quadriplegia, suprapubic Foley catheter, who is being admitted with dehydration, generalized weakness, refractory lactic acidosis, after presenting with 1 to 2 days of generalized weakness.  At baseline, in the setting of his transverse myelitis, the patient has diminished fine motor ability, but retains proximal muscle strength in all 4 extremities.  Relative to this baseline, he has had some generalized, nonfocal weakness over the last 1 to 2 days.  He also has a history of recurrent urinary tract infections, and has been noted to exhibit lactic acidosis at the time of previous urinary tract infections.  \ He appears dehydrated This evening, with laboratory evidence of acute prerenal azotemia.  Additionally, his initial lactate of 3.0 has improved only slightly following interval IV fluids administered in the ED.  Urinalysis is equivocal, in the context of underlying suprapubic Foley catheter.  He is afebrile.  I have placed an order for further evaluation and management of dehydration, generalized weakness, refractory lactic acidosis.  I have placed some additional preliminary admit orders via the adult multi-morbid admission order set. I have also ordered lactated Ringer's at 100 cc/h x 8 hours with repeat lactate to be checked at 8 AM.  Will defer decision making regarding indication vs lack thereof for additional antibiotics to the admitting hospitalist.    Newton Pigg, DO Hospitalist

## 2023-02-21 NOTE — Procedures (Signed)
Vascular and Interventional Radiology Procedure Note  Patient: Brent [name]. DOB: [birth year] Medical Record Number: [id number] Note Date/Time: 02/21/23 1:22 PM   Performing Physician: Roanna Banning, MD Assistant(s): None  Diagnosis: Hx of persistent gastrocutaneous fistula  Procedure:  GASTROCUTANEOUS FISTULA EVALUATION  PERCUTANEOUS ENTEROCUTANEOUS FISTULA CLOSURE   Anesthesia: Conscious Sedation Complications: None Estimated Blood Loss: Minimal Specimens: Sent for None   Findings:  Successful Fluoroscopy-guided GC fistula evaluation and percutaneous ECF closure with [facility] Plug.   Plan:  Post Procedure Precautions:  Dietary restrictions. Please MUST observe a liquid diet for the first 48 hours, followed by a high fiber diet  Expect some drainage for up to 16 weeks after the procedure  Stool softener is recommended  Use of over-the-counter strength pain medicine is OK after the procedure  No strenuous physical activity for at least 6 weeks after procedure  No lifting of items over 10 pounds for at least 6 weeks after procedure  - Follow up drain evaluation to be scheduled in Clinic in  6 week(s).  See detailed procedure note with images in PACS. The patient tolerated the procedure well without incident or complication and was returned to Recovery in stable condition.    Roanna Banning, MD Vascular and Interventional Radiology [facility] Radiology   Pager. [phone] Clinic. [phone]

## 2023-02-21 NOTE — H&P (Addendum)
History and Physical    Patient: Brent [name]. ZOX:[id number] DOB: Dec 08, 1945 DOA: 02/20/2023 DOS: the patient was seen and examined on 02/21/2023 PCP: [name], MD  Patient coming from: Skilled nursing facility  Chief Complaint:  Chief Complaint  Patient presents with   Weakness   HPI: Brent [name]. is a 77 y.o. male with medical history significant of hypertension, diabetes mellitus type 2, quadriplegia due to transverse myelitis, NMO on Rituxan, diabetes disease, gastric outlet obstruction, neuroendocrine tumor s/p resection, neurogenic bladder s/p suprapubic catheter, chronic sacral ulcer, who presents with complaints of weakness and stating he has been seeing things that are not there since yesterday.  Patient reported associated symptoms of shortness of breath especially when laying flat and decreased p.o. intake.  His suprapubic catheter had last been changed on March 10.  On he was scheduled to have  a procedure with interventional radiology today at [facility] for attempted percutaneous gastrocutaneous fistula closure today enterocutaneous plug.   In the emergency department patient was noted to be afebrile with stable vital signs.  Labs noted WBC 9.7, hemoglobin 11.5, lactic acid 3-> 2.9-> 2.5.  Chest x-ray showed no acute abnormality.  Urinalysis noted moderate leukocytes, positive nitrites, many bacteria, and greater than 50 WBCs.  CT scan of the brain noted chronic changes without acute abnormality.  Blood and urine cultures have been obtained.  Patient has been given 1 L of lactated Ringer's and started on 100 mL/h.  Review of Systems: As mentioned in the history of present illness. All other systems reviewed and are negative. 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 angioplasty of his RCA in 1990. b. bare metal stent placed in the RCA in 2000 followed by  rotational atherectomy shortly after for stent restenosis. c. last cath was in 2012 showing stable moderate diffuse CAD. (70% mid LAD, 80% diagonal, 70% Ramus, 40% mid to distal RCA stent restenosis). d. Low risk nuc in 2015.   Diabetes mellitus    Diverticulosis    Elevated CK    Erectile dysfunction    Hemorrhoids    HTN (hypertension)    Hyperlipidemia    Hypertriglyceridemia    Malignant melanoma of left side of neck (HCC) 10/25/2018   Myocardial infarction (HCC)    Obesity    OSA (obstructive sleep apnea)    Persistent disorder of initiating or maintaining sleep    Personal history of colonic polyps 02/05/2003   Renal lesion 06/20/2016   Past Surgical History:  Procedure Laterality Date   BIOPSY  03/26/2022   Procedure: BIOPSY;  Surgeon: Iva Boop, MD;  Location: [facility] ENDOSCOPY;  Service: Gastroenterology;;   CHOLECYSTECTOMY     CORONARY ANGIOPLASTY     CORONARY STENT PLACEMENT     stenting of the right coronary artery with followup rotational  atherectomy. (3 stents placed)   ENDOSCOPIC MUCOSAL RESECTION  10/13/2022   Procedure: ENDOSCOPIC MUCOSAL RESECTION;  Surgeon: Meridee Score Netty Starring., MD;  Location: [facility] ENDOSCOPY;  Service: Gastroenterology;;   ESOPHAGOGASTRODUODENOSCOPY (EGD) WITH PROPOFOL N/A 03/26/2022   Procedure: ESOPHAGOGASTRODUODENOSCOPY (EGD) WITH PROPOFOL;  Surgeon: Iva Boop, MD;  Location: [facility] ENDOSCOPY;  Service: Gastroenterology;  Laterality: N/A;   ESOPHAGOGASTRODUODENOSCOPY (EGD) WITH PROPOFOL N/A 07/11/2022   Procedure: ESOPHAGOGASTRODUODENOSCOPY (EGD) WITH PROPOFOL;  Surgeon: Meridee Score Netty Starring., MD;  Location: [facility] ENDOSCOPY;  Service: Gastroenterology;  Laterality: N/A;  ESOPHAGOGASTRODUODENOSCOPY (EGD) WITH PROPOFOL N/A 10/13/2022   Procedure: ESOPHAGOGASTRODUODENOSCOPY (EGD) WITH PROPOFOL;  Surgeon: Meridee Score Netty Starring., MD;  Location: [facility] ENDOSCOPY;  Service: Gastroenterology;  Laterality: N/A;   EUS N/A 10/13/2022   Procedure: UPPER  ENDOSCOPIC ULTRASOUND (EUS) RADIAL;  Surgeon: Lemar Lofty., MD;  Location: [facility] ENDOSCOPY;  Service: Gastroenterology;  Laterality: N/A;   FINGER SURGERY     right   FOOT SURGERY     right   HEMOSTASIS CLIP PLACEMENT  07/11/2022   Procedure: HEMOSTASIS CLIP PLACEMENT;  Surgeon: Meridee Score Netty Starring., MD;  Location: [facility] ENDOSCOPY;  Service: Gastroenterology;;  ovesco    HEMOSTASIS CLIP PLACEMENT  10/13/2022   Procedure: HEMOSTASIS CLIP PLACEMENT;  Surgeon: Lemar Lofty., MD;  Location: [facility] ENDOSCOPY;  Service: Gastroenterology;;   HOT HEMOSTASIS N/A 07/11/2022   Procedure: HOT HEMOSTASIS (ARGON PLASMA COAGULATION/BICAP);  Surgeon: Lemar Lofty., MD;  Location: [facility] ENDOSCOPY;  Service: Gastroenterology;  Laterality: N/A;   HOT HEMOSTASIS N/A 10/13/2022   Procedure: HOT HEMOSTASIS (ARGON PLASMA COAGULATION/BICAP);  Surgeon: Lemar Lofty., MD;  Location: [facility] ENDOSCOPY;  Service: Gastroenterology;  Laterality: N/A;   INGUINAL HERNIA REPAIR     right   IR GASTROSTOMY TUBE REMOVAL  02/10/2022   IR REPLC GASTRO/COLONIC TUBE PERCUT W/FLUORO  12/22/2022   IR REPLC GASTRO/COLONIC TUBE PERCUT W/FLUORO  01/26/2023   IRRIGATION AND DEBRIDEMENT ABSCESS N/A 06/04/2020   Procedure: IRRIGATION AND DEBRIDEMENT SACRAL WOUND;  Surgeon: Diamantina Monks, MD;  Location: [facility] OR;  Service: General;  Laterality: N/A;   LAPAROSCOPY N/A 06/04/2020   Procedure: LAPAROSCOPY ASSISTED COLOSTOMY, LAPAROSCOPY GASTROSTOMY;  Surgeon: Diamantina Monks, MD;  Location: [facility] OR;  Service: General;  Laterality: N/A;   LYSIS OF ADHESION N/A 06/04/2020   Procedure: LYSIS OF ADHESION;  Surgeon: Diamantina Monks, MD;  Location: [facility] OR;  Service: General;  Laterality: N/A;   melanoma removal     neck   ORTHOPEDIC SURGERY     foot right   POLYPECTOMY  10/13/2022   Procedure: POLYPECTOMY;  Surgeon: Lemar Lofty., MD;  Location: [facility] ENDOSCOPY;  Service: Gastroenterology;;   rotator cuff surg     Bil    STENT REMOVAL  10/13/2022   Procedure: CLIP REMOVAL;  Surgeon: Lemar Lofty., MD;  Location: [facility] ENDOSCOPY;  Service: Gastroenterology;;   SUBMUCOSAL LIFTING INJECTION  10/13/2022   Procedure: SUBMUCOSAL LIFTING INJECTION;  Surgeon: Lemar Lofty., MD;  Location: [facility] ENDOSCOPY;  Service: Gastroenterology;;   SUBMUCOSAL TATTOO INJECTION  10/13/2022   Procedure: SUBMUCOSAL TATTOO INJECTION;  Surgeon: Lemar Lofty., MD;  Location: [facility] ENDOSCOPY;  Service: Gastroenterology;;   Social History:  reports that he quit smoking about 42 years ago. His smoking use included cigarettes. He has a 45.00 pack-year smoking history. He has never used smokeless tobacco. He reports that he does not currently use alcohol after a past usage of about 7.0 - 14.0 standard drinks of alcohol per week. He reports that he does not use drugs.  Allergies  Allergen Reactions   Gabapentin Other (See Comments)    Unknown   Levofloxacin Rash   Penicillins Hives and Rash    Tolerated Unasyn, Augmentin, cefepime and cephalexin    Family History  Problem Relation Age of Onset   Lung cancer Mother    COPD Mother    Obesity Mother    Liver cancer Father    Lung cancer Father    Heart disease Father    Heart disease Sister    Colon  cancer Neg Hx    Stomach cancer Neg Hx    Pancreatic cancer Neg Hx    Esophageal cancer Neg Hx    Inflammatory bowel disease Neg Hx    Liver disease Neg Hx    Rectal cancer Neg Hx     Prior to Admission medications   Medication Sig Start Date End Date Taking? Authorizing Provider  Alogliptin Benzoate 25 MG TABS Take 25 mg by mouth daily.   Yes [provider]  Amino Acids-Protein Hydrolys (FEEDING SUPPLEMENT, PRO-STAT 64,) LIQD Take 30 mLs by mouth in the morning and at bedtime.   Yes [provider]  atenolol (TENORMIN) 25 MG tablet Take 12.5-25 mg by mouth See admin instructions. Take 12.5 mg (1/2 tablet) by mouth once daily in the morning and 25  mg (1 tablet) at bedtime.   Yes [provider]  atorvastatin (LIPITOR) 20 MG tablet Take 1 tablet (20 mg total) by mouth daily. Patient taking differently: Take 20 mg by mouth at bedtime. 03/26/20  Yes Burnadette Pop, MD  B COMPLEX VITAMINS PO Take 1 capsule by mouth daily.   Yes [provider]  baclofen (LIORESAL) 10 MG tablet Take 10 mg by mouth 3 (three) times daily.   Yes [provider]  cephALEXin (KEFLEX) 250 MG capsule Take 250 mg by mouth daily.   Yes [provider]  cholecalciferol (VITAMIN D3) 25 MCG (1000 UNIT) tablet Take 1,000 Units by mouth daily.   Yes [provider]  DULoxetine (CYMBALTA) 30 MG capsule Take 30 mg by mouth at bedtime.   Yes [provider]  famotidine (PEPCID) 20 MG tablet Take 20 mg by mouth 2 (two) times daily.   Yes [provider]  fexofenadine (ALLEGRA) 180 MG tablet Take 180 mg by mouth daily.   Yes [provider]  gentamicin cream (GARAMYCIN) 0.1 % Apply 1 Application topically daily. To right 4th digit cuticle after washing with soap and water. 10/05/22  Yes [provider]  insulin aspart (NOVOLOG) 100 UNIT/ML injection Inject 0-15 Units into the skin every 4 (four) hours. Sliding scale  CBG 70 - 120: 0 units: CBG 121 - 150: 2 units; CBG 151 - 200: 3 units; CBG 201 - 250: 5 units; CBG 251 - 300: 8 units;CBG 301 - 350: 11 units; CBG 351 - 400: 15 units; CBG > 400 : 15 units and notify MD Patient taking differently: Inject 26 Units into the skin See admin instructions. Inject 26 units into the skin before meals. Do not give if CBG <158ml/dl. 06/10/20  Yes Rai, Ripudeep K, MD  insulin glargine (LANTUS) 100 unit/mL SOPN Inject 15 Units into the skin at bedtime. Patient taking differently: Inject 75 Units into the skin daily. 06/10/20  Yes Rai, Ripudeep K, MD  lamoTRIgine (LAMICTAL) 25 MG tablet Take 25 mg by mouth 3 (three) times daily. For 5 days. Started 02/18/23.   Yes [provider]  losartan (COZAAR) 50 MG tablet Take 75 mg by mouth daily.   Yes [provider]  Magnesium Oxide, Elemental, 400 MG TABS Take 400 mg by mouth 2 (two) times daily.   Yes [provider]  melatonin 3 MG TABS tablet Take 3 mg by mouth at bedtime.   Yes [provider]  Menthol, Topical Analgesic, (BIOFREEZE) 4 % GEL Apply 1 application  topically 2 (two) times daily as needed (Pain to back upper and lower arms and shoulders).   Yes [provider]  metFORMIN (  GLUCOPHAGE) 500 MG tablet Take 500 mg by mouth 2 (two) times daily with a meal.   Yes [provider]  nystatin powder Apply 1 Application topically daily. Apply to peri-wound bed to sacrum and right ischium daily with routine dressing.   Yes [provider]  Omega-3 Fatty Acids (FISH OIL) 1000 MG CAPS Take 1,000 mg by mouth daily.   Yes [provider]  omeprazole (PRILOSEC) 40 MG capsule Take 1 capsule (40 mg total) by mouth 2 (two) times daily. 30 minutes before breakfast/dinner Patient taking differently: Take 40 mg by mouth 2 (two) times daily. 10/13/22  Yes Mansouraty, Netty Starring., MD  ondansetron (ZOFRAN) 4 MG tablet Take 4 mg by mouth every 8 (eight) hours as needed for nausea.   Yes [provider]  Polyethyl Glycol-Propyl Glycol (GENTEAL TEARS SEVERE DAY/NIGHT) 0.4-0.3 % GEL ophthalmic gel Place 1 Application into both eyes 4 (four) times daily as needed (Dry Eyes).   Yes [provider]  PSYLLIUM PO Take 0.8 g by mouth in the morning and at bedtime.   Yes [provider]  senna-docusate (SENOKOT-S) 8.6-50 MG tablet Take 1 tablet by mouth 2 (two) times daily.   Yes [provider]  spironolactone (ALDACTONE) 25 MG tablet Take 25 mg by mouth daily.   Yes [provider]  traMADol (ULTRAM) 50 MG tablet Take 50 mg by mouth daily as needed (Pain).   Yes [provider]  traZODone (DESYREL) 50 MG tablet Take 75 mg  by mouth at bedtime.   Yes [provider]  vitamin C (ASCORBIC ACID) 500 MG tablet Take 500 mg by mouth daily.   Yes [provider]    Physical Exam: Vitals:   02/21/23 0500 02/21/23 0559 02/21/23 0630 02/21/23 0645  BP: 111/62     Pulse: 80  80 77  Resp: 20  19 18   Temp:  98.1 F (36.7 C)    TempSrc:  Oral    SpO2: 100%  100% 100%   Exam  Constitutional: Obese elderly male currently in no acute distress. Eyes: PERRL, lids and conjunctivae normal ENMT: Mucous membranes are moist. Posterior pharynx clear of any exudate or lesions.Normal dentition.  Neck: normal, supple, no masses, no thyromegaly Respiratory: clear to auscultation bilaterally, no wheezing, no crackles. Normal respiratory effort. No accessory muscle use.  Cardiovascular: Regular rate and rhythm, no murmurs / rubs / gallops. No extremity edema. 2+ pedal pulses. No carotid bruits.  Abdomen: Colostomy present in left lower quadrant of the abdomen.  Suprapubic catheter in place. Musculoskeletal: no clubbing / cyanosis.  Decreased muscle tone of the lower extremities. Skin: no rashes, lesions, ulcers.  Neurologic: CN 2-12 grossly intact.  Able to move bilateral upper extremities.  Decreased sensation from chest down. Psychiatric: Normal judgment and insight. Alert and oriented x 3. Normal mood.   Data Reviewed:  EKG reveals sinus bradycardia 58 bpm. Reviewed labs, imaging, and pertinent records as noted above in HPI  Assessment and Plan: Suspected complicated urinary tract infection Patient presented with complaints of weakness.  Has a chronic indwelling suprapubic catheter in place due to neurogenic bladder and records note prior history of recurrent UTIs.  Urinalysis noted moderate leukocytes, positive nitrites, many bacteria, and greater than 50 WBCs.  Previous cultures grew out Pseudomonas back in 2022 which was pansensitive.  Question acute infection versus colonization -Admit to med surge  bed -Follow-up urine culture -Check CRP -Rocephin IV\ -Will need to change patient's suprapubic catheter at some point  during this hospitalization  Acute metabolic encephalopathy Patient was reported to be altered and hallucinating prior to arrival.  Suspect secondary to infection. -Continue to monitor  Lactic acidosis Acute.  Initial lactic acid elevated at 3.  Subsequent checks trending down with IV fluids.  Cause thought related to urinalysis was noted to be abnormal with concern for infection and/or decreased p.o. intake with initial BUN and creatinine ratio greater than 20 to suggests dehydration. -Continue to monitor  Diabetes mellitus type 2, with long-term use of insulin On admission glucose 256.  Home insulin regimen includes alogliptin benzoate 25 mg daily, NovoLog 26 units 3 times daily with meals, glargine 75 units daily, and metformin 500 mg twice daily. -Hypoglycemic protocols -Hold metformin and alogliptin benzoate -Pharmacy substitution of Semglee reduced to 40 units daily as patient had been n.p.o. -CBGs before every meal with resistant SSI -Adjust insulin regimen as needed  Normocytic anemia Hemoglobin 11.5 on admission with elevated MCV.  Patient is hemodynamically stable at this time -Continue to monitor  Quadriplegia secondary to transverse myelitis Neuromyelitis optica Patient being treated in the outpatient setting by neurology Dr. Harlan Stains.  Records note he was to continue on rituximab 1000mg  x 2 separated by 2 weeks and lamotrigine has been added during the 5/21 office visit for dyesthesias. -Continue lamotrigine and baclofen -Continue outpatient follow-up with neurology  Gastrocutaneous fistula  Chronic.  Patient with persistent gastrocutaneous fistula from the G-tube placement on 02/10/2022.  Prior attempts at closure have been unsuccessful.  Plan was for patient to go for attempted percutaneous gastrocutaneous fistula closure with a enterocutaneous  plug. -Appreciate IR consultative services  Essential hypertension -Continue home medications of atenolol and losartan -Resume spironolactone medically appropriate  Sacral pressure wound -Low-air-loss mattress replacement -Local wound care  GERD  -Continue Pepcid  DVT prophylaxis: Lovenox Advance Care Planning:   Code Status: Full Code   Consults: IR  Family Communication: Wife updated over the phone.  Severity of Illness: The appropriate patient status for this patient is OBSERVATION. Observation status is judged to be reasonable and necessary in order to provide the required intensity of service to ensure the patient's safety. The patient's presenting symptoms, physical exam findings, and initial radiographic and laboratory data in the context of their medical condition is felt to place them at decreased risk for further clinical deterioration. Furthermore, it is anticipated that the patient will be medically stable for discharge from the hospital within 2 midnights of admission.   Author: Clydie Braun, MD 02/21/2023 8:53 AM  For on call review [email or web address].

## 2023-02-21 NOTE — ED Notes (Signed)
RN at bedside assisting pt with eating food tray.

## 2023-02-22 ENCOUNTER — Encounter (HOSPITAL_COMMUNITY): Payer: Self-pay | Admitting: Internal Medicine

## 2023-02-22 ENCOUNTER — Other Ambulatory Visit: Payer: Self-pay

## 2023-02-22 ENCOUNTER — Inpatient Hospital Stay (HOSPITAL_COMMUNITY): Payer: Medicare Other

## 2023-02-22 DIAGNOSIS — A419 Sepsis, unspecified organism: Secondary | ICD-10-CM | POA: Diagnosis not present

## 2023-02-22 LAB — GLUCOSE, CAPILLARY
Glucose-Capillary: 277 mg/dL — ABNORMAL HIGH (ref 70–99)
Glucose-Capillary: 254 mg/dL — ABNORMAL HIGH (ref 70–99)
Glucose-Capillary: 287 mg/dL — ABNORMAL HIGH (ref 70–99)
Glucose-Capillary: 194 mg/dL — ABNORMAL HIGH (ref 70–99)

## 2023-02-22 LAB — BASIC METABOLIC PANEL
Anion gap: 10 (ref 5–15)
BUN: 15 mg/dL (ref 8–23)
CO2: 22 mmol/L (ref 22–32)
Calcium: 8.7 mg/dL — ABNORMAL LOW (ref 8.9–10.3)
Chloride: 99 mmol/L (ref 98–111)
Creatinine, Ser: 0.6 mg/dL — ABNORMAL LOW (ref 0.61–1.24)
GFR, Estimated: >60 mL/min (ref >60)
Glucose, Bld: 259 mg/dL — ABNORMAL HIGH (ref 70–99)
Potassium: 4.1 mmol/L (ref 3.5–5.1)
Sodium: 131 mmol/L — ABNORMAL LOW (ref 135–145)

## 2023-02-22 LAB — CBC
HCT: 33.7 % — ABNORMAL LOW (ref 39.0–52.0)
Hemoglobin: 10.9 g/dL — ABNORMAL LOW (ref 13.0–17.0)
MCH: 28.2 pg (ref 26.0–34.0)
MCHC: 32.3 g/dL (ref 30.0–36.0)
MCV: 87.1 fL (ref 80.0–100.0)
Platelets: 253 10*3/uL (ref 150–400)
RBC: 3.87 MIL/uL — ABNORMAL LOW (ref 4.22–5.81)
RDW: 15.6 % — ABNORMAL HIGH (ref 11.5–15.5)
WBC: 6.5 10*3/uL (ref 4.0–10.5)
nRBC: 0 % (ref 0.0–0.2)

## 2023-02-22 LAB — MAGNESIUM: Magnesium: 1.6 mg/dL — ABNORMAL LOW (ref 1.7–2.4)

## 2023-02-22 LAB — BRAIN NATRIURETIC PEPTIDE: B Natriuretic Peptide: 91.3 pg/mL (ref 0.0–100.0)

## 2023-02-22 LAB — URINE CULTURE

## 2023-02-22 LAB — CULTURE, BLOOD (ROUTINE X 2)

## 2023-02-22 LAB — PROCALCITONIN: Procalcitonin: <0.1 ng/mL

## 2023-02-22 LAB — HEMOGLOBIN A1C
Hgb A1c MFr Bld: 8.8 % — ABNORMAL HIGH (ref 4.8–5.6)
Hgb A1c MFr Bld: 8.6 % — ABNORMAL HIGH (ref 4.8–5.6)
Mean Plasma Glucose: 206 mg/dL
Mean Plasma Glucose: 200 mg/dL

## 2023-02-22 LAB — URIC ACID: Uric Acid, Serum: 7.3 mg/dL (ref 3.7–8.6)

## 2023-02-22 LAB — MRSA NEXT GEN BY PCR, NASAL: MRSA by PCR Next Gen: DETECTED — AB

## 2023-02-22 LAB — OSMOLALITY, URINE: Osmolality, Ur: 634 mOsm/kg (ref 300–900)

## 2023-02-22 LAB — C-REACTIVE PROTEIN: CRP: 6.1 mg/dL — ABNORMAL HIGH (ref <1.0)

## 2023-02-22 LAB — OSMOLALITY: Osmolality: 302 mOsm/kg — ABNORMAL HIGH (ref 275–295)

## 2023-02-22 LAB — SODIUM, URINE, RANDOM: Sodium, Ur: 105 mmol/L

## 2023-02-22 MED ORDER — MEDIHONEY WOUND/BURN DRESSING EX PSTE
1.0000 | PASTE | Freq: Every day | CUTANEOUS | Status: DC
  Administered 2023-02-24 – 2023-02-25 (×2): 1 via TOPICAL
  Filled 2023-02-22: qty 44

## 2023-02-22 MED ORDER — SODIUM CHLORIDE 0.9 % IV SOLN
2.0000 g | Freq: Once | INTRAVENOUS | Status: AC
  Administered 2023-02-22: 2 g via INTRAVENOUS
  Filled 2023-02-22: qty 12.5

## 2023-02-22 MED ORDER — INSULIN ASPART 100 UNIT/ML IJ SOLN
4.0000 [IU] | Freq: Three times a day (TID) | INTRAMUSCULAR | Status: DC
  Administered 2023-02-22 – 2023-02-24 (×8): 4 [IU] via SUBCUTANEOUS

## 2023-02-22 MED ORDER — MAGNESIUM SULFATE 4 GM/100ML IV SOLN
4.0000 g | Freq: Once | INTRAVENOUS | Status: AC
  Administered 2023-02-22: 4 g via INTRAVENOUS
  Filled 2023-02-22: qty 100

## 2023-02-22 MED ORDER — POLYVINYL ALCOHOL 1.4 % OP SOLN
1.0000 [drp] | Freq: Four times a day (QID) | OPHTHALMIC | Status: DC | PRN
  Administered 2023-02-22 – 2023-02-25 (×6): 1 [drp] via OPHTHALMIC
  Filled 2023-02-22: qty 15

## 2023-02-22 MED ORDER — B COMPLEX-C PO TABS
1.0000 | ORAL_TABLET | Freq: Every day | ORAL | Status: DC
  Administered 2023-02-22 – 2023-02-25 (×4): 1 via ORAL
  Filled 2023-02-22 (×4): qty 1

## 2023-02-22 MED ORDER — VITAMIN D 25 MCG (1000 UNIT) PO TABS
1000.0000 [IU] | ORAL_TABLET | Freq: Every day | ORAL | Status: DC
  Administered 2023-02-22 – 2023-02-25 (×4): 1000 [IU] via ORAL
  Filled 2023-02-22 (×4): qty 1

## 2023-02-22 MED ORDER — MAGNESIUM OXIDE -MG SUPPLEMENT 400 (240 MG) MG PO TABS
400.0000 mg | ORAL_TABLET | Freq: Two times a day (BID) | ORAL | Status: DC
  Administered 2023-02-23 – 2023-02-25 (×5): 400 mg via ORAL
  Filled 2023-02-22 (×5): qty 1

## 2023-02-22 MED ORDER — SODIUM CHLORIDE 0.9 % IV SOLN
2.0000 g | Freq: Two times a day (BID) | INTRAVENOUS | Status: DC
  Administered 2023-02-22 – 2023-02-23 (×2): 2 g via INTRAVENOUS
  Filled 2023-02-22 (×2): qty 12.5

## 2023-02-22 MED ORDER — DOCUSATE SODIUM 100 MG PO CAPS
200.0000 mg | ORAL_CAPSULE | Freq: Every day | ORAL | Status: DC
  Administered 2023-02-22 – 2023-02-25 (×4): 200 mg via ORAL
  Filled 2023-02-22 (×4): qty 2

## 2023-02-22 MED ORDER — VITAMIN C 500 MG PO TABS
500.0000 mg | ORAL_TABLET | Freq: Every day | ORAL | Status: DC
  Administered 2023-02-22 – 2023-02-25 (×4): 500 mg via ORAL
  Filled 2023-02-22 (×4): qty 1

## 2023-02-22 MED ORDER — LACTATED RINGERS IV SOLN: INTRAVENOUS | Status: DC

## 2023-02-22 NOTE — [place] FL2 (Signed)
[place] MEDICAID FL2 LEVEL OF CARE FORM     IDENTIFICATION  Patient Name: Brent [name]. Birthdate: Jan 22, 1946 Sex: male Admission Date (Current Location): 02/20/2023  [facility] and [place] Number:  Producer, television/film/video and Address:  [facility], [address] [place], [place] [address]      Provider Number: [id number]  Attending Physician Name and Address:  Leroy Sea, MD  Relative Name and Phone Number:       Current Level of Care: Hospital Recommended Level of Care: Skilled Nursing Facility Prior Approval Number:    Date Approved/Denied:   PASRR Number: [id number]  Discharge Plan: SNF    Current Diagnoses: Patient Active Problem List   Diagnosis Date Noted   Dehydration 02/21/2023   Sepsis (HCC) 02/21/2023   Acute metabolic encephalopathy 02/21/2023   Lactic acidosis 02/21/2023   Type 2 diabetes mellitus with complication, with long-term current use of insulin (HCC) 02/21/2023   Normocytic anemia 02/21/2023   Primary malignant neuroendocrine tumor of stomach (HCC) 05/27/2022   Preoperative examination 05/27/2022   Abnormal endoscopy of upper gastrointestinal tract 05/27/2022   Benign neuroendocrine tumor of stomach  04/04/2022   Mucosal abnormality of stomach    Chronic osteomyelitis of pelvic region, right (HCC) 03/25/2022   Gastrocutaneous fistula 03/25/2022   Complicated urinary tract infection    Catheter-associated urinary tract infection (HCC) 08/06/2021   Osteomyelitis of pelvic region, acute, right (HCC) 03/24/2021   Right ischial pressure sore 02/10/2021   Hypokalemia    Encounter for monitoring immunomodulating therapy 08/20/2020   High risk medication use 08/20/2020   Malnutrition of moderate degree 06/04/2020   Palliative care by specialist    Goals of care, counseling/discussion    FTT (failure to thrive) in adult 06/01/2020   Abdominal pain    Sacral ulcer, with necrosis of muscle (HCC) 04/09/2020    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HCC)    Transverse myelitis (HCC)    Labile blood pressure    Labile blood glucose    Neurogenic bladder    Neurogenic bowel    Transaminitis    Controlled type 2 diabetes mellitus with hyperglycemia, with long-term current use of insulin (HCC)    Quadriplegia (HCC)    Dyslipidemia    Paraplegia (HCC)    Coronary artery disease involving native coronary artery of native heart without angina pectoris    Steroid-induced hyperglycemia    Diabetic peripheral neuropathy (HCC)    Weakness 03/09/2020   Chest pain 03/08/2020   Right leg numbness    Malignant melanoma of left side of neck (HCC) 10/25/2018   Facial neuritis 10/25/2018   Myofascial pain syndrome 10/25/2018   Occipital neuralgia of left side 10/25/2018   Male hypogonadism 06/20/2016   Renal lesion 06/20/2016   Insomnia 08/07/2015   Enlarged prostate with lower urinary tract symptoms (LUTS) 11/07/2014   Balanitis 07/30/2012   Bladder neck obstruction 07/30/2012   Prostate nodule 06/18/2012   Recurrent nephrolithiasis 06/18/2012   Benign neoplasm of colon 08/29/2011   DEPRESSION, SITUATIONAL, ACUTE 01/23/2010   Essential hypertension 05/30/2009   Coronary atherosclerosis 05/30/2009   Obstructive sleep apnea 11/28/2008   OBESITY 09/23/2008   ERECTILE DYSFUNCTION 11/26/2007   Well controlled type 2 diabetes mellitus with peripheral circulatory disorder (HCC) 05/09/2007   Hyperlipidemia 05/09/2007   MYOCARDIAL INFARCTION, HX OF 05/09/2007   DIVERTICULOSIS, COLON 05/09/2007    Orientation RESPIRATION BLADDER Height & Weight     Self, Time, Situation, Place  Normal Incontinent, Indwelling catheter Weight:  Height:  6' (182.9 cm)  BEHAVIORAL SYMPTOMS/MOOD NEUROLOGICAL BOWEL NUTRITION STATUS      Colostomy, Continent Diet (See DC Summary)  AMBULATORY STATUS COMMUNICATION OF NEEDS Skin   Extensive Assist Verbally PU Stage and Appropriate Care (Pressure injury from prior encounter)                        Personal Care Assistance Level of Assistance  Bathing, Feeding, Dressing Bathing Assistance: Maximum assistance Feeding assistance: Limited assistance Dressing Assistance: Maximum assistance     Functional Limitations Info             SPECIAL CARE FACTORS FREQUENCY                       Contractures Contractures Info: Not present    Additional Factors Info  Code Status, Allergies Code Status Info: Full Allergies Info: Gabapentin, Levofloxacin, Penicillins           Current Medications (02/22/2023):  This is the current hospital active medication list Current Facility-Administered Medications  Medication Dose Route Frequency Provider Last Rate Last Admin   acetaminophen (TYLENOL) tablet 650 mg  650 mg Oral Q6H PRN Clydie Braun, MD       Or   acetaminophen (TYLENOL) suppository 650 mg  650 mg Rectal Q6H PRN Madelyn Flavors A, MD       albuterol (PROVENTIL) (2.5 MG/3ML) 0.083% nebulizer solution 2.5 mg  2.5 mg Nebulization Q6H PRN Katrinka Blazing, Rondell A, MD       ascorbic acid (VITAMIN C) tablet 500 mg  500 mg Oral Daily Susa Raring K, MD   500 mg at 02/22/23 1208   atenolol (TENORMIN) tablet 12.5 mg  12.5 mg Oral q morning Smith, Rondell A, MD   12.5 mg at 02/22/23 1019   atenolol (TENORMIN) tablet 25 mg  25 mg Oral QPM Smith, Rondell A, MD   25 mg at 02/21/23 2209   atorvastatin (LIPITOR) tablet 20 mg  20 mg Oral QHS Smith, Rondell A, MD   20 mg at 02/21/23 2206   B-complex with vitamin C tablet 1 tablet  1 tablet Oral Daily Leroy Sea, MD   1 tablet at 02/22/23 1207   baclofen (LIORESAL) tablet 10 mg  10 mg Oral TID Madelyn Flavors A, MD   10 mg at 02/22/23 1022   ceFEPIme (MAXIPIME) 2 g in sodium chloride 0.9 % 100 mL IVPB  2 g Intravenous Q12H Pham, Minh Q, RPH-CPP       Chlorhexidine Gluconate Cloth 2 % PADS 6 each  6 each Topical Daily Madelyn Flavors A, MD   6 each at 02/21/23 2206   cholecalciferol (VITAMIN D3) 25 MCG (1000 UNIT) tablet 1,000  Units  1,000 Units Oral Daily Leroy Sea, MD   1,000 Units at 02/22/23 1208   docusate sodium (COLACE) capsule 200 mg  200 mg Oral Daily Susa Raring K, MD   200 mg at 02/22/23 1208   DULoxetine (CYMBALTA) DR capsule 30 mg  30 mg Oral QHS Smith, Rondell A, MD   30 mg at 02/21/23 2205   enoxaparin (LOVENOX) injection 40 mg  40 mg Subcutaneous Q24H Smith, Rondell A, MD   40 mg at 02/21/23 2207   famotidine (PEPCID) tablet 20 mg  20 mg Oral BID Madelyn Flavors A, MD   20 mg at 02/22/23 1022   insulin aspart (novoLOG) injection 0-20 Units  0-20 Units Subcutaneous TID WC Smith, Rondell A,  MD   11 Units at 02/22/23 1351   insulin aspart (novoLOG) injection 0-5 Units  0-5 Units Subcutaneous QHS Madelyn Flavors A, MD   3 Units at 02/21/23 2213   insulin aspart (novoLOG) injection 4 Units  4 Units Subcutaneous TID WC Leroy Sea, MD   4 Units at 02/22/23 1352   insulin glargine-yfgn (SEMGLEE) injection 40 Units  40 Units Subcutaneous Daily Madelyn Flavors A, MD   40 Units at 02/22/23 1026   lactated ringers infusion   Intravenous Continuous Leroy Sea, MD 100 mL/hr at 02/22/23 1215 New Bag at 02/22/23 1215   lamoTRIgine (LAMICTAL) tablet 25 mg  25 mg Oral TID Clydie Braun, MD   25 mg at 02/22/23 1022   leptospermum manuka honey (MEDIHONEY) paste 1 Application  1 Application Topical Daily Leroy Sea, MD       loratadine (CLARITIN) tablet 10 mg  10 mg Oral Daily Madelyn Flavors A, MD   10 mg at 02/22/23 1022   losartan (COZAAR) tablet 75 mg  75 mg Oral Daily Madelyn Flavors A, MD   75 mg at 02/22/23 1022   [START ON 02/23/2023] magnesium oxide (MAG-OX) tablet 400 mg  400 mg Oral BID Leroy Sea, MD       melatonin tablet 3 mg  3 mg Oral QHS Madelyn Flavors A, MD   3 mg at 02/21/23 2206   nystatin (MYCOSTATIN/NYSTOP) topical powder 1 Application  1 Application Topical Daily Smith, Rondell A, MD       ondansetron (ZOFRAN) tablet 4 mg  4 mg Oral Q6H PRN Clydie Braun, MD        Or   ondansetron (ZOFRAN) injection 4 mg  4 mg Intravenous Q6H PRN Smith, Rondell A, MD       polyethylene glycol 0.4% and propylene glycol 0.3% (SYSTANE) ophthalmic gel  1 Application Both Eyes QID PRN Madelyn Flavors A, MD       senna-docusate (Senokot-S) tablet 1 tablet  1 tablet Oral BID Madelyn Flavors A, MD   1 tablet at 02/22/23 1023   sodium chloride flush (NS) 0.9 % injection 3 mL  3 mL Intravenous Q12H Smith, Rondell A, MD   3 mL at 02/21/23 2208   traMADol (ULTRAM) tablet 50 mg  50 mg Oral Daily PRN Madelyn Flavors A, MD   50 mg at 02/22/23 0315   traZODone (DESYREL) tablet 75 mg  75 mg Oral QHS Madelyn Flavors A, MD   75 mg at 02/21/23 2215     Discharge Medications: Please see discharge summary for a list of discharge medications.  Relevant Imaging Results:  Relevant Lab Results:   Additional Information SS# [id number]  Mearl Latin, LCSW

## 2023-02-22 NOTE — Evaluation (Signed)
Physical Therapy Evaluation Patient Details Name: Brent [name]. MRN: [id number] DOB: 03/22/1946 Today's Date: 02/22/2023  History of Present Illness  77 y/o M admitted to [facility] on 6/3 for weakness and visual hallucinations. Gastrocutaneous fistula closed 6/4 by IR. PMHx: HTN, DM, quadriplegia due to transverse myelitis, NMO, gastric outlet obstruction, neuroendocrine tumor s/p resection, neurogenic bladder s/p suprapubic catheter, chronic sacral ulcer  Clinical Impression  Pt presents today functioning at or close to his mobility baseline. Pt resides at a facility, reports requiring max-total A for bed mobility for rolling with hoyer lift transfers out of bed to his power wheelchair. Pt reports he doesn't sit EOB at baseline, but participates in rolling, which he was able to complete today with maxAx2 and use of bedrails, which pt reports feels similar to his mobility prior to admission. Pt reports good care with all needs met at facility, able to self navigate his power wheelchair without concern. Pt has no further benefit from skilled acute PT at this time, able to assist with bed mobility and reports plans to return to facility at discharge. Acute PT will sign off.        Recommendations for follow up therapy are one component of a multi-disciplinary discharge planning process, led by the attending physician.  Recommendations may be updated based on patient status, additional functional criteria and insurance authorization.  Follow Up Recommendations Can patient physically be transported by private vehicle: No     Assistance Recommended at Discharge Frequent or constant Supervision/Assistance  Patient can return home with the following  Two people to help with walking and/or transfers;Two people to help with bathing/dressing/bathroom;Assist for transportation    Equipment Recommendations None recommended by PT  Recommendations for Other Services       Functional Status Assessment  Patient has had a recent decline in their functional status and/or demonstrates limited ability to make significant improvements in function in a reasonable and predictable amount of time     Precautions / Restrictions Precautions Precautions: Fall Restrictions Weight Bearing Restrictions: No      Mobility  Bed Mobility Overal bed mobility: Needs Assistance Bed Mobility: Rolling Rolling: Max assist, +2 for physical assistance         General bed mobility comments: rolling left and right with maxAx2 and use of bed rail, assist to initiate roll and for BLE management and trunk support    Transfers                   General transfer comment: pt utilizing hoyer at baseline    Ambulation/Gait         Gait velocity: pt not ambulatory at baseline        Stairs            Wheelchair Mobility    Modified Rankin (Stroke Patients Only)       Balance Overall balance assessment:  (denies falls, doesn't sit EOB at baseline)                                           Pertinent Vitals/Pain Pain Assessment Pain Assessment: No/denies pain    Home Living Family/patient expects to be discharged to:: Skilled nursing facility                   Additional Comments: pt resident at [facility], reports hoyer lift for transfer to power wheelchair  that he is able to self navigate. Pt participates in rolling for bed mobility, doesn't sit EOB, has tilt in space power chair    Prior Function Prior Level of Function : Needs assist             Mobility Comments: hoyer lift transfers, max-totalA for rolling, able to self navigate power wheelchair ADLs Comments: max-totalA     Hand Dominance   Dominant Hand: Left    Extremity/Trunk Assessment   Upper Extremity Assessment Upper Extremity Assessment: Defer to OT evaluation    Lower Extremity Assessment Lower Extremity Assessment: RLE deficits/detail;LLE deficits/detail RLE Deficits /  Details: no active movement noted in LE, intact to light touch sensation but reports occasional tingling sensation, clonus noted LLE Deficits / Details: no active movement noted in LE, intact to light touch sensation but reports occasional tingling sensation, clonus noted       Communication   Communication: No difficulties  Cognition Arousal/Alertness: Awake/alert Behavior During Therapy: WFL for tasks assessed/performed Overall Cognitive Status: No family/caregiver present to determine baseline cognitive functioning                                 General Comments: A&Ox4, good recall of previous function, following commands and answering questions well        General Comments General comments (skin integrity, edema, etc.): VSS on room air    Exercises     Assessment/Plan    PT Assessment Patient does not need any further PT services  PT Problem List         PT Treatment Interventions      PT Goals (Current goals can be found in the Care Plan section)  Acute Rehab PT Goals Patient Stated Goal: go home PT Goal Formulation: All assessment and education complete, DC therapy    Frequency       Co-evaluation               AM-PAC PT "6 Clicks" Mobility  Outcome Measure Help needed turning from your back to your side while in a flat bed without using bedrails?: A Lot Help needed moving from lying on your back to sitting on the side of a flat bed without using bedrails?: Total Help needed moving to and from a bed to a chair (including a wheelchair)?: Total Help needed standing up from a chair using your arms (e.g., wheelchair or bedside chair)?: Total Help needed to walk in hospital room?: Total Help needed climbing 3-5 steps with a railing? : Total 6 Click Score: 7    End of Session   Activity Tolerance: Patient tolerated treatment well Patient left: in bed;with call bell/phone within reach Nurse Communication: Mobility status PT Visit Diagnosis:  Other abnormalities of gait and mobility (R26.89)    Time: 8469-6295 PT Time Calculation (min) (ACUTE ONLY): 19 min   Charges:   PT Evaluation $PT Eval Low Complexity: 1 Low          Lindalou Hose, PT DPT Acute Rehabilitation Services Office [phone]   Leonie Man 02/22/2023, 1:42 PM

## 2023-02-22 NOTE — Plan of Care (Signed)
  Problem: Education: Goal: Ability to describe self-care measures that may prevent or decrease complications (Diabetes Survival Skills Education) will improve 02/22/2023 1427 by Horris Latino, RN Outcome: Progressing 02/22/2023 0817 by Horris Latino, RN Outcome: Progressing Goal: Individualized Educational Video(s) 02/22/2023 1427 by Horris Latino, RN Outcome: Progressing 02/22/2023 0817 by Horris Latino, RN Outcome: Progressing   Problem: Coping: Goal: Ability to adjust to condition or change in health will improve 02/22/2023 1427 by Horris Latino, RN Outcome: Progressing 02/22/2023 0817 by Horris Latino, RN Outcome: Progressing   Problem: Fluid Volume: Goal: Ability to maintain a balanced intake and output will improve 02/22/2023 1427 by Horris Latino, RN Outcome: Progressing 02/22/2023 0817 by Horris Latino, RN Outcome: Progressing   Problem: Health Behavior/Discharge Planning: Goal: Ability to identify and utilize available resources and services will improve 02/22/2023 1427 by Horris Latino, RN Outcome: Progressing 02/22/2023 0817 by Horris Latino, RN Outcome: Progressing Goal: Ability to manage health-related needs will improve 02/22/2023 1427 by Horris Latino, RN Outcome: Progressing 02/22/2023 0817 by Horris Latino, RN Outcome: Progressing   Problem: Metabolic: Goal: Ability to maintain appropriate glucose levels will improve 02/22/2023 1427 by Horris Latino, RN Outcome: Progressing 02/22/2023 0817 by Horris Latino, RN Outcome: Progressing   Problem: Nutritional: Goal: Maintenance of adequate nutrition will improve 02/22/2023 1427 by Horris Latino, RN Outcome: Progressing 02/22/2023 0817 by Horris Latino, RN Outcome: Progressing Goal: Progress toward achieving an optimal weight will improve 02/22/2023 1427 by Horris Latino, RN Outcome: Progressing 02/22/2023 0817 by Horris Latino, RN Outcome: Progressing   Problem: Skin Integrity: Goal: Risk for impaired skin integrity will decrease 02/22/2023 1427 by  Horris Latino, RN Outcome: Progressing 02/22/2023 0817 by Horris Latino, RN Outcome: Progressing   Problem: Tissue Perfusion: Goal: Adequacy of tissue perfusion will improve 02/22/2023 1427 by Horris Latino, RN Outcome: Progressing 02/22/2023 0817 by Horris Latino, RN Outcome: Progressing   Problem: Education: Goal: Knowledge of General Education information will improve Description: Including pain rating scale, medication(s)/side effects and non-pharmacologic comfort measures 02/22/2023 1427 by Horris Latino, RN Outcome: Progressing 02/22/2023 0817 by Horris Latino, RN Outcome: Progressing

## 2023-02-22 NOTE — Evaluation (Signed)
Occupational Therapy Evaluation and Discharge Patient Details Name: Brent [name]. MRN: [id number] DOB: 01-23-1946 Today's Date: 02/22/2023   History of Present Illness 77 y/o M admitted to [facility] on 6/3 for weakness and visual hallucinations. Gastrocutaneous fistula closed 6/4 by IR. PMHx: HTN, DM, quadriplegia due to transverse myelitis, NMO, gastric outlet obstruction, neuroendocrine tumor s/p resection, neurogenic bladder s/p suprapubic catheter, chronic sacral ulcer   Clinical Impression   At baseline, pt resides at [facility], requires Max to Total assist with ADLs, and uses a Hoyer lift for functional transfers. At baseline, pt is Independent with use of power wheelchair for functional mobility. Pt presents at baseline PLOF and currently requires Max to Total assist with UB ADLs, Total assist with LB ADLs, Max assist +2 for rolling Left and Right in the bed, and a Hoyer-style mechanical lift for functional transfers with pt reporting he feels is is at baseline functional level. Pt reports good care at SNF with all needs met and reports no additional concerns at this time. As pt is at baseline PLOF, no additional acute skilled OT services are indicated at this time. Post acute discharge, no OT follow up is indicated at this time. Acute OT will sign off.      Recommendations for follow up therapy are one component of a multi-disciplinary discharge planning process, led by the attending physician.  Recommendations may be updated based on patient status, additional functional criteria and insurance authorization.   Assistance Recommended at Discharge Frequent or constant Supervision/Assistance  Patient can return home with the following Two people to help with walking and/or transfers;Two people to help with bathing/dressing/bathroom;Assistance with cooking/housework;Assistance with feeding;Direct supervision/assist for medications management;Direct supervision/assist for financial management;Assist for  transportation;Help with stairs or ramp for entrance    Functional Status Assessment  Patient has not had a recent decline in their functional status  Equipment Recommendations  None recommended by OT    Recommendations for Other Services       Precautions / Restrictions Precautions Precautions: Fall Restrictions Weight Bearing Restrictions: No      Mobility Bed Mobility Overal bed mobility: Needs Assistance Bed Mobility: Rolling Rolling: Max assist, +2 for physical assistance         General bed mobility comments: Rolling Left and Right with Max assist +2 and use of bed rail with assist to initiate roll, trunk support, and B LE management.    Transfers Overall transfer level: Needs assistance                 General transfer comment: Pt uses Hoyer lift for functional transfers at baseline.      Balance Overall balance assessment:  (Pt reports no falls. At baseline, pt does not sit at EOB.)                                         ADL either performed or assessed with clinical judgement   ADL Overall ADL's : At baseline                                       General ADL Comments: Pt is at baseline PLOF for safety and independence with ADLs. Pt requires Max to Total assist for all ADLs at baseline.     Vision Baseline Vision/History: 1 Wears glasses Patient Visual  Report: Other (comment) (Pt reports print is someitmes appearing larger all of a sudden when reading then returns to normal. Vision appeared [facility] during skilled OT eval.)       Perception     Praxis Praxis Praxis tested?: Within functional limits    Pertinent Vitals/Pain Pain Assessment Pain Assessment: No/denies pain     Hand Dominance Left   Extremity/Trunk Assessment Upper Extremity Assessment Upper Extremity Assessment: Generalized weakness;RUE deficits/detail;LUE deficits/detail RUE Deficits / Details: Generalized weakness, impaired AROM shoulder  flexion; at baseline RUE Sensation: decreased light touch;decreased proprioception RUE Coordination: decreased fine motor;decreased gross motor LUE Deficits / Details: Generalized weakness, impaired AROM shoulder flexion and abduction; at baseline LUE Sensation: decreased light touch;decreased proprioception LUE Coordination: decreased fine motor;decreased gross motor   Lower Extremity Assessment Lower Extremity Assessment: Defer to PT evaluation RLE Deficits / Details: no active movement noted in LE, intact to light touch sensation but reports occasional tingling sensation, clonus noted LLE Deficits / Details: no active movement noted in LE, intact to light touch sensation but reports occasional tingling sensation, clonus noted       Communication Communication Communication: No difficulties   Cognition Arousal/Alertness: Awake/alert Behavior During Therapy: WFL for tasks assessed/performed Overall Cognitive Status: No family/caregiver present to determine baseline cognitive functioning                                 General Comments: AAOx4. Pt demonstrates ability to consistantly follow 1 step commands and appears to have good recall of history, events, and level of funciton prior to current hospital admission.     General Comments  VSS on RA throughout session.    Exercises     Shoulder Instructions      Home Living Family/patient expects to be discharged to:: Skilled nursing facility                                 Additional Comments: pt resident at [facility], reports hoyer lift for transfer to power wheelchair that he is able to self navigate. Pt participates in rolling for bed mobility, doesn't sit EOB, has tilt in space power chair      Prior Functioning/Environment Prior Level of Function : Needs assist             Mobility Comments: Requires Hoyer lift; Has power wheelchair and is able to navigate Independently once in  wheelchair ADLs Comments: Requires Max to Total assist for all ADLs        OT Problem List:        OT Treatment/Interventions:      OT Goals(Current goals can be found in the care plan section) Acute Rehab OT Goals Patient Stated Goal: To return to SNF  OT Frequency:      Co-evaluation              AM-PAC OT "6 Clicks" Daily Activity     Outcome Measure Help from another person eating meals?: Total Help from another person taking care of personal grooming?: Total Help from another person toileting, which includes using toliet, bedpan, or urinal?: Total Help from another person bathing (including washing, rinsing, drying)?: Total Help from another person to put on and taking off regular upper body clothing?: A Lot Help from another person to put on and taking off regular lower body clothing?: Total 6 Click Score: 7  End of Session Nurse Communication: Mobility status;Other (comment) (Need for paddle style call bell)  Activity Tolerance: Patient tolerated treatment well Patient left: in bed;with call bell/phone within reach;with bed alarm set                   Time: 8657-8469 OT Time Calculation (min): 23 min Charges:  OT General Charges $OT Visit: 1 Visit OT Evaluation $OT Eval Moderate Complexity: 1 Mod  [address] OTR/L, MA Acute Rehab [phone]   Lendon Colonel 02/22/2023, 1:53 PM

## 2023-02-22 NOTE — Progress Notes (Signed)
PROGRESS NOTE                                                                                                                                                                                                             Patient Demographics:    Brent [name], is a 77 y.o. male, DOB - Nov 14, 1945, ZOX:[id number]  Outpatient Primary MD for the patient is [name], MD    LOS - 1  Admit date - 02/20/2023    Chief Complaint  Patient presents with   Weakness       Brief Narrative (HPI from H&P)   77 y.o. 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   Suprapubic catheter is due to be changed soon, workup here consistent with UTI present on admission and he was admitted to the hospital.   Subjective:    Brent [name] today has, No headache, No chest pain, No abdominal pain - No Nausea, No new weakness tingling or numbness, no SOB or cough.   Assessment  & Plan :   Complicated UTI present on admission in the presence of indwelling suprapubic catheter, sepsis.  Prelim cultures noted, antibiotics adjusted, placed on IV fluids, suprapubic catheter will be changed on 02/22/2024 as it is due to be changed in the next few days, monitor closely.  Clinically better.  Sepsis pathophysiology has clinically resolved.  Acute metabolic encephalopathy.  Due to #1 above.  Much improved, no headache, CT head unremarkable, close to baseline.  Functional quadriplegia secondary to transverse  myelitis, history of neuromyelitis optica.  Chronically bedbound SNF bound patient.  Supportive care.  Gastrocutaneous fistula.  At the site of previous G-tube site, closed by IR on 02/21/2023.  GERD.  On Pepcid.  Sacral decubitus ulcer present on admission.  Wound care consulted, kindly see wound care notes for details.  HTN - stable on B Blocker, ARB.  Chronic normocytic anemia.  Supportive care and monitor.  Hyponatremia.  Could be dehydration versus SIADH,  check urine electrolytes and osmolality.  Hydrate and monitor.  DM type II.  Continue present regimen of Lantus and sliding scale and monitor.  CBG (last 3)  Recent Labs    02/21/23 1527 02/21/23 2204 02/22/23 0853  GLUCAP 262* 262* 277*   Lab Results  Component Value Date   HGBA1C 9.3 (H) 03/25/2022         Condition - Extremely Guarded  Family Communication  :  None present  Code Status :  Full  Consults  :  IR  PUD Prophylaxis : Pepcid   Procedures  :     CT head.  Unremarkable.    IR 02/20/22 - Successful Fluoroscopy-guided GC fistula evaluation and percutaneous ECF closure with [facility] Plug.   Plan:  Post Procedure Precautions:  Dietary restrictions. Please MUST observe a liquid diet for the first 48 hours, followed by a high fiber diet  Expect some drainage for up to 16 weeks after the procedure  Stool softener is recommended  Use of over-the-counter strength pain medicine is OK after the procedure  No strenuous physical activity for at least 6 weeks after procedure  No lifting of items over 10 pounds for at least 6 weeks after procedure         Disposition Plan  :    Status is: Inpatient   DVT Prophylaxis  :    enoxaparin (LOVENOX) injection 40 mg Start: 02/21/23 2200 SCDs Start: 02/21/23 0523   Lab Results  Component Value Date   PLT 253 02/22/2023    Diet :  Diet Order             Diet clear liquid Room service appropriate? Yes; Fluid consistency: Thin  Diet effective  now                    Inpatient Medications  Scheduled Meds:  atenolol  12.5 mg Oral q morning   atenolol  25 mg Oral QPM   atorvastatin  20 mg Oral QHS   baclofen  10 mg Oral TID   Chlorhexidine Gluconate Cloth  6 each Topical Daily   docusate sodium  200 mg Oral Daily   DULoxetine  30 mg Oral QHS   enoxaparin (LOVENOX) injection  40 mg Subcutaneous Q24H   famotidine  20 mg Oral BID   insulin aspart  0-20 Units Subcutaneous TID WC   insulin aspart  0-5 Units Subcutaneous QHS   insulin glargine-yfgn  40 Units Subcutaneous Daily   lamoTRIgine  25 mg Oral TID   leptospermum manuka honey  1 Application Topical Daily   loratadine  10 mg Oral Daily   losartan  75 mg Oral Daily   melatonin  3 mg Oral QHS   nystatin  1 Application Topical Daily   senna-docusate  1 tablet Oral BID   sodium chloride flush  3 mL Intravenous Q12H   traZODone  75 mg Oral QHS   Continuous Infusions:  lactated ringers     magnesium sulfate bolus IVPB     PRN Meds:.acetaminophen **OR** acetaminophen, albuterol, ondansetron **OR** ondansetron (ZOFRAN) IV, Polyethyl Glycol-Propyl Glycol, traMADol  Antibiotics  :    Anti-infectives (From admission, onward)    Start     Dose/Rate Route Frequency Ordered Stop   02/21/23 1200  ceFAZolin (ANCEF) IVPB 1 g/50 mL premix        1 g 100 mL/hr over 30 Minutes Intravenous  Once 02/21/23 1152 02/21/23 1425   02/21/23 0930  cefTRIAXone (ROCEPHIN)  2 g in sodium chloride 0.9 % 100 mL IVPB  Status:  Discontinued        2 g 200 mL/hr over 30 Minutes Intravenous Every 24 hours 02/21/23 0918 02/22/23 1100         Objective:   Vitals:   02/21/23 2339 02/22/23 0310 02/22/23 0800 02/22/23 1019  BP: (!) 123/55 (!) 146/55    Pulse: 67 62  62  Resp: 15 20    Temp: 98.2 F (36.8 C) 98.2 F (36.8 C) 97.6 F (36.4 C)   TempSrc: Oral Oral Oral   SpO2: 95% 92%      Wt Readings from Last 3 Encounters:  10/13/22 97.5 kg  07/11/22 93 kg  05/26/22 90.3 kg      Intake/Output Summary (Last 24 hours) at 02/22/2023 1102 Last data filed at 02/22/2023 0600 Gross per 24 hour  Intake --  Output 2300 ml  Net -2300 ml     Physical Exam  Awake Alert, No new F.N deficits, chronic diffuse generalized weakness due to Devic's disease, suprapubic catheter in place [place].AT,PERRAL Supple Neck, No JVD,   Symmetrical Chest wall movement, Good air movement bilaterally, CTAB RRR,No Gallops,Rubs or new Murmurs,  +ve B.Sounds, Abd Soft, No tenderness,   No Cyanosis, Clubbing or edema     RN pressure injury documentation: Pressure Injury 12/22/20 Sacrum Upper Stage 3 -  Full thickness tissue loss. Subcutaneous fat may be visible but bone, tendon or muscle are NOT exposed. not a pressure injury; Chronic full thickness wound (Active)  12/22/20 0400  Location: Sacrum  Location Orientation: Upper  Staging: Stage 3 -  Full thickness tissue loss. Subcutaneous fat may be visible but bone, tendon or muscle are NOT exposed.  Wound Description (Comments): not a pressure injury; Chronic full thickness wound  Present on Admission: Yes     Pressure Injury 12/22/20 Sacrum Medial Stage 4 - Full thickness tissue loss with exposed bone, tendon or muscle. (Active)  12/22/20 0400  Location: Sacrum  Location Orientation: Medial  Staging: Stage 4 - Full thickness tissue loss with exposed bone, tendon or muscle.  Wound Description (Comments):   Present on Admission: Yes     Pressure Injury 12/22/20 Heel Left;Right Stage 2 -  Partial thickness loss of dermis presenting as a shallow open injury with a red, pink wound bed without slough. (Active)  12/22/20 0400  Location: Heel  Location Orientation: Left;Right  Staging: Stage 2 -  Partial thickness loss of dermis presenting as a shallow open injury with a red, pink wound bed without slough.  Wound Description (Comments):   Present on Admission: Yes     Pressure Injury 12/22/20 Stage 1 -  Intact skin with non-blanchable  redness of a localized area usually over a bony prominence. (Active)  12/22/20 0400  Location:   Location Orientation:   Staging: Stage 1 -  Intact skin with non-blanchable redness of a localized area usually over a bony prominence.  Wound Description (Comments):   Present on Admission: No     Pressure Injury 12/22/20 Ischial tuberosity Right Unstageable - Full thickness tissue loss in which the base of the injury is covered by slough (yellow, tan, gray, green or brown) and/or eschar (tan, brown or black) in the wound bed. (Active)  12/22/20 0400  Location: Ischial tuberosity  Location Orientation: Right  Staging: Unstageable - Full thickness tissue loss in which the base of the injury is covered by slough (yellow, tan, gray, green or brown) and/or eschar (tan, brown  or black) in the wound bed.  Wound Description (Comments):   Present on Admission: Yes     Pressure Injury 02/22/23 (Active)  02/22/23 0800  Location:   Location Orientation:   Staging:   Wound Description (Comments):   Present on Admission: No     Pressure Injury 08/09/21 Sacrum Stage 4 - Full thickness tissue loss with exposed bone, tendon or muscle. (Active)  08/09/21   Location: Sacrum  Location Orientation:   Staging: Stage 4 - Full thickness tissue loss with exposed bone, tendon or muscle.  Wound Description (Comments):   Present on Admission: Yes     Pressure Injury 03/26/22 Ischial tuberosity Right (Active)  03/26/22 0930  Location: Ischial tuberosity  Location Orientation: Right  Staging:   Wound Description (Comments):   Present on Admission:       Data Review:    Recent Labs  Lab 02/21/23 0030 02/22/23 0636  WBC 9.7 6.5  HGB 11.5* 10.9*  HCT 35.8* 33.7*  PLT 268 253  MCV 89.7 87.1  MCH 28.8 28.2  MCHC 32.1 32.3  RDW 15.9* 15.6*  LYMPHSABS 1.4  --   MONOABS 0.7  --   EOSABS 0.2  --   BASOSABS 0.0  --     Recent Labs  Lab 02/21/23 0030 02/21/23 0348 02/21/23 0734 02/21/23 2129  02/22/23 0636  NA 135  --   --   --  131*  K 5.1  --   --   --  4.1  CL 99  --   --   --  99  CO2 21*  --   --   --  22  ANIONGAP 15  --   --   --  10  GLUCOSE 256*  --   --   --  259*  BUN 24*  --   --   --  15  CREATININE 0.93  --   --   --  0.60*  AST 20  --   --   --   --   ALT 24  --   --   --   --   ALKPHOS 78  --   --   --   --   BILITOT 0.5  --   --   --   --   ALBUMIN 2.9*  --   --   --   --   CRP  --   --   --  8.4* 6.1*  PROCALCITON  --   --   --   --  <0.10  LATICACIDVEN 3.0* 2.9* 2.5*  --   --   INR 1.1  --   --   --   --   BNP  --   --   --   --  91.3  MG  --   --   --   --  1.6*  CALCIUM 9.2  --   --   --  8.7*      Recent Labs  Lab 02/21/23 0030 02/21/23 0348 02/21/23 0734 02/21/23 2129 02/22/23 0636  CRP  --   --   --  8.4* 6.1*  PROCALCITON  --   --   --   --  <0.10  LATICACIDVEN 3.0* 2.9* 2.5*  --   --   INR 1.1  --   --   --   --   BNP  --   --   --   --  91.3  MG  --   --   --   --  1.6*  CALCIUM 9.2  --   --   --  8.7*     Micro Results Recent Results (from the past 240 hour(s))  Blood Culture (routine x 2)     Status: None (Preliminary result)   Collection Time: 02/21/23 12:30 AM   Specimen: BLOOD  Result Value Ref Range Status   Specimen Description BLOOD SITE NOT SPECIFIED  Final   Special Requests   Final    BOTTLES DRAWN AEROBIC AND ANAEROBIC Blood Culture results may not be optimal due to an excessive volume of blood received in culture bottles   Culture   Final    NO GROWTH < 24 HOURS Performed at [facility], [address] [place], [place] [address]    Report Status PENDING  Incomplete  Blood Culture (routine x 2)     Status: None (Preliminary result)   Collection Time: 02/21/23 12:30 AM   Specimen: BLOOD  Result Value Ref Range Status   Specimen Description BLOOD SITE NOT SPECIFIED  Final   Special Requests   Final    BOTTLES DRAWN AEROBIC AND ANAEROBIC Blood Culture results may not be optimal due to an excessive  volume of blood received in culture bottles   Culture   Final    NO GROWTH < 24 HOURS Performed at [facility], [address], [place], [place] [address]    Report Status PENDING  Incomplete  Culture, Urine (Do not remove urinary catheter, catheter placed by urology or difficult to place)     Status: Abnormal (Preliminary result)   Collection Time: 02/21/23 12:30 AM   Specimen: Urine, Catheterized  Result Value Ref Range Status   Specimen Description URINE, CATHETERIZED  Final   Special Requests NONE  Final   Culture (A)  Final    >=100,000 COLONIES/mL MORGANELLA MORGANII CULTURE REINCUBATED FOR BETTER GROWTH SUSCEPTIBILITIES TO FOLLOW Performed at [facility], [address], [place], [place] [address]    Report Status PENDING  Incomplete    Radiology Reports DG Chest Port 1 View  Result Date: 02/22/2023 CLINICAL DATA:  Shortness of breath. EXAM: PORTABLE CHEST 1 VIEW COMPARISON:  February 21, 2023. FINDINGS: Stable cardiomediastinal silhouette. Both lungs are clear. The visualized skeletal structures are unremarkable. IMPRESSION: No active disease. Electronically Signed   By: Lupita Raider M.D.   On: 02/22/2023 10:16   IR Fluoro Procedure Unlisted  Result Date: 02/21/2023 CLINICAL DATA:  Persistent gastrocutaneous fistula at G-tube site. Briefly, 77 year old male with a history of surgically placed gastrostomy (06/04/2020), with subsequent removal on 02/10/2022. Patient with persistent drainage and gastrostomy tract with concern for GC fistula. Prior endoscopic attempts at closure with OTSC/Ovesco clips (07/11/2022 and 10/13/2022) were unsuccessful. EXAM: IR FLUORO PROCEDURE UNLISTED Procedures: 1. GASTROCUTANEOUS FISTULA EVALUATION 2. PERCUTANEOUS ENTEROCUTANEOUS FISTULA CLOSURE COMPARISON:  IR fluoroscopy, 12/22/2022 and 01/26/2023. CT AP, 12/08/2022. CONTRAST:  10 mL Omnipaque 300-administered via the percutaneous drainage catheter. MEDICATIONS: Ancef 1 g IV. Preprocedure  antibiotics administered within 60 minutes of procedure. ANESTHESIA/SEDATION: Moderate (conscious) sedation was employed during this procedure. A total of Versed 2 mg and Fentanyl 100 mcg was administered intravenously. Moderate Sedation Time: 41 minutes. The patient's level of consciousness and vital signs were monitored continuously by radiology nursing throughout the procedure under my direct supervision. FLUOROSCOPY TIME:  Fluoroscopic dose; 42 mGy TECHNIQUE: Informed consent was obtained from the patient and/or patient's representative following explanation of the procedure, risks, benefits and alternatives. All questions were addressed. A time out  was performed prior to the initiation of the procedure. Maximal barrier sterile technique utilized including caps, mask, sterile gowns, sterile gloves, large sterile drape, hand hygiene, and sterile prep. The patient was positioned supine on the fluoroscopy table. The external portion of the existing gastrostomy/percutaneous catheter as well as the surrounding skin was prepped and draped in usual sterile fashion. A preprocedural spot fluoroscopic image was obtained of the existing percutaneous catheter. A small amount of contrast was injected via the existing percutaneous catheter and several fluoroscopic images were obtained in various obliquities. The external portion of the percutaneous catheter was cut and cannulated with a short Amplatz wire. Under intermittent fluoroscopic guidance, the existing percutaneous catheter was exchanged for a 9 Fr short sheath then a cytology brush introduced over wire was used to debride the epithelialized catheter tract. The sheath and brush were removed. The tract was irrigated with saline. Serial tract dilation with dilators up to 22 Fr was performed. A Cook 22 French dry-type seal sheath was introduced through the tract into the stomach, then the Biodesign ECF plug was released in stomach then retracted to the abdominal wall.  The plug was rehydrated with 40 mL saline, and the sheath was removed. The plug was cut to size and while under tension was secured by a Molnar disc to the anterior abdominal wall. Fluoroscopic images were acquired demonstrating plug apposition with appropriate tension. The patient tolerated the procedure well without immediate postprocedural complication. FINDINGS: 1. 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 2. Uncomplicated percutaneous ECF closure with [facility] plug. IMPRESSION: Successful percutaneous gastrocutaneous fistula closure with a Cook Biodesign enterocutaneous plug, as above. RECOMMENDATIONS: Post Procedure Precautions:. Dietary restrictions. Patient MUST observe a liquid diet for the first 48 hours, followed by a high fiber diet. Expect some drainage for up to 16 weeks after the procedure. Stool softener is recommended. Use of over-the-counter strength pain medicine is OK after the procedure. No strenuous physical activity for at least 6 weeks after procedure. No lifting of items over 10 pounds for at least 6 weeks after procedure PLAN: The patient will return to Vascular Interventional Radiology (VIR) for re-evaluation at the ambulatory clinic in 6 weeks. Roanna Banning, MD [facility] Electronically Signed   By: Roanna Banning M.D.   On: 02/21/2023 13:58   CT HEAD WO CONTRAST ( )  Result Date: 02/21/2023 CLINICAL DATA:  Altered mental status EXAM: CT HEAD WITHOUT CONTRAST TECHNIQUE: Contiguous axial images were obtained from the base of the skull through the vertex without intravenous contrast. RADIATION DOSE REDUCTION: This exam was performed according to the departmental dose-optimization program which includes automated exposure control, adjustment of the mA and/or kV according to patient size and/or use of iterative reconstruction technique. COMPARISON:  08/06/2021 FINDINGS: Brain: No evidence of acute infarction,  hemorrhage, hydrocephalus, extra-axial collection or mass lesion/mass effect. Stable arachnoid cyst is noted in the posterior fossa on the right. Mild atrophic changes are noted. Chronic white matter ischemic changes are seen as well. Vascular: No hyperdense vessel or unexpected calcification. Skull: Normal. Negative for fracture or focal lesion. Sinuses/Orbits: No acute finding. Other: None. IMPRESSION: Chronic changes without acute abnormality. Electronically Signed   By: Alcide Clever M.D.   On: 02/21/2023 01:33   DG Chest Port 1 View  Result Date: 02/21/2023 CLINICAL DATA:  Possible sepsis EXAM: PORTABLE CHEST 1 VIEW COMPARISON:  03/25/2022 FINDINGS: Cardiac shadow is enlarged. Lungs are clear bilaterally. No bony abnormality is seen. IMPRESSION: No active disease. Electronically Signed  By: Alcide Clever M.D.   On: 02/21/2023 01:10      Signature  -   Susa Raring M.D on 02/22/2023 at 11:02 AM   -  To page go to [email or web address]

## 2023-02-22 NOTE — TOC Initial Note (Signed)
Transition of Care (TOC) - Initial/Assessment Note    Patient Details  Name: Brent [name]. MRN: [id number] Date of Birth: September 10, 1946  Transition of Care [facility]) CM/SW Contact:    Mearl Latin, LCSW Phone Number: 02/22/2023, 4:08 PM  Clinical Narrative:                 Patient admitted from [facility] long term care. CSW will continue to follow.   Expected Discharge Plan: Skilled Nursing Facility Barriers to Discharge: Continued Medical Work up   Patient Goals and CMS Choice            Expected Discharge Plan and Services In-house Referral: Clinical Social Work     Living arrangements for the past 2 months: Skilled Nursing Facility                                      Prior Living Arrangements/Services Living arrangements for the past 2 months: Skilled Nursing Facility Lives with:: Facility Resident Patient language and need for interpreter reviewed:: Yes        Need for Family Participation in Patient Care: Yes (Comment) Care giver support system in place?: Yes (comment)   Criminal Activity/Legal Involvement Pertinent to Current Situation/Hospitalization: No - Comment as needed  Activities of Daily Living Home Assistive Devices/Equipment: Wheelchair ADL Screening (condition at time of admission) Patient's cognitive ability adequate to safely complete daily activities?: Yes Is the patient deaf or have difficulty hearing?: No Does the patient have difficulty seeing, even when wearing glasses/contacts?: No Does the patient have difficulty concentrating, remembering, or making decisions?: No Patient able to express need for assistance with ADLs?: Yes Does the patient have difficulty dressing or bathing?: Yes Independently performs ADLs?: No Communication: Independent Dressing (OT): Needs assistance Is this a change from baseline?: Pre-admission baseline Grooming: Needs assistance Is this a change from baseline?: Pre-admission baseline Feeding: Needs  assistance Is this a change from baseline?: Pre-admission baseline Bathing: Needs assistance Is this a change from baseline?: Pre-admission baseline Toileting: Needs assistance Is this a change from baseline?: Pre-admission baseline In/Out Bed: Needs assistance Is this a change from baseline?: Pre-admission baseline Walks in Home: Dependent Is this a change from baseline?: Pre-admission baseline Does the patient have difficulty walking or climbing stairs?: Yes Weakness of Legs: Both Weakness of Arms/Hands: Both  Permission Sought/Granted                  Emotional Assessment       Orientation: : Oriented to Self, Oriented to Place, Oriented to  Time, Oriented to Situation Alcohol / Substance Use: Not Applicable Psych Involvement: No (comment)  Admission diagnosis:  Dehydration [E86.0] Lactic acidosis [E87.20] Weakness [R53.1] Gastrocutaneous fistula [K31.6] Sepsis (HCC) [A41.9] Patient Active Problem List   Diagnosis Date Noted   Dehydration 02/21/2023   Sepsis (HCC) 02/21/2023   Acute metabolic encephalopathy 02/21/2023   Lactic acidosis 02/21/2023   Type 2 diabetes mellitus with complication, with long-term current use of insulin (HCC) 02/21/2023   Normocytic anemia 02/21/2023   Primary malignant neuroendocrine tumor of stomach (HCC) 05/27/2022   Preoperative examination 05/27/2022   Abnormal endoscopy of upper gastrointestinal tract 05/27/2022   Benign neuroendocrine tumor of stomach  04/04/2022   Mucosal abnormality of stomach    Chronic osteomyelitis of pelvic region, right (HCC) 03/25/2022   Gastrocutaneous fistula 03/25/2022   Complicated urinary tract infection    Catheter-associated urinary tract infection (  HCC) 08/06/2021   Osteomyelitis of pelvic region, acute, right (HCC) 03/24/2021   Right ischial pressure sore 02/10/2021   Hypokalemia    Encounter for monitoring immunomodulating therapy 08/20/2020   High risk medication use 08/20/2020    Malnutrition of moderate degree 06/04/2020   Palliative care by specialist    Goals of care, counseling/discussion    FTT (failure to thrive) in adult 06/01/2020   Abdominal pain    Sacral ulcer, with necrosis of muscle (HCC) 04/09/2020   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HCC)    Transverse myelitis (HCC)    Labile blood pressure    Labile blood glucose    Neurogenic bladder    Neurogenic bowel    Transaminitis    Controlled type 2 diabetes mellitus with hyperglycemia, with long-term current use of insulin (HCC)    Quadriplegia (HCC)    Dyslipidemia    Paraplegia (HCC)    Coronary artery disease involving native coronary artery of native heart without angina pectoris    Steroid-induced hyperglycemia    Diabetic peripheral neuropathy (HCC)    Weakness 03/09/2020   Chest pain 03/08/2020   Right leg numbness    Malignant melanoma of left side of neck (HCC) 10/25/2018   Facial neuritis 10/25/2018   Myofascial pain syndrome 10/25/2018   Occipital neuralgia of left side 10/25/2018   Male hypogonadism 06/20/2016   Renal lesion 06/20/2016   Insomnia 08/07/2015   Enlarged prostate with lower urinary tract symptoms (LUTS) 11/07/2014   Balanitis 07/30/2012   Bladder neck obstruction 07/30/2012   Prostate nodule 06/18/2012   Recurrent nephrolithiasis 06/18/2012   Benign neoplasm of colon 08/29/2011   DEPRESSION, SITUATIONAL, ACUTE 01/23/2010   Essential hypertension 05/30/2009   Coronary atherosclerosis 05/30/2009   Obstructive sleep apnea 11/28/2008   OBESITY 09/23/2008   ERECTILE DYSFUNCTION 11/26/2007   Well controlled type 2 diabetes mellitus with peripheral circulatory disorder (HCC) 05/09/2007   Hyperlipidemia 05/09/2007   MYOCARDIAL INFARCTION, HX OF 05/09/2007   DIVERTICULOSIS, COLON 05/09/2007   PCP:  [name], MD Pharmacy:  No Pharmacies Listed    Social Determinants of Health (SDOH) Social History: SDOH Screenings   Food Insecurity: No Food Insecurity (02/21/2023)   Housing: Low Risk  (02/21/2023)  Transportation Needs: No Transportation Needs (02/21/2023)  Utilities: Not At Risk (02/21/2023)  Depression (PHQ2-9): Low Risk  (12/09/2020)  Financial Resource Strain: Low Risk  (03/06/2020)  Physical Activity: Inactive (12/14/2018)  Social Connections: Unknown (12/14/2018)  Stress: No Stress Concern Present (12/14/2018)  Tobacco Use: Medium Risk (02/22/2023)   SDOH Interventions:     Readmission Risk Interventions    06/08/2020   11:45 AM  Readmission Risk Prevention Plan  Transportation Screening Complete  Medication Review (RN Care Manager) Referral to Pharmacy  PCP or Specialist appointment within 3-5 days of discharge Not Complete  PCP/Specialist Appt Not Complete comments SNF resident  [facility] or Home Care Consult Complete  SW Recovery Care/Counseling Consult Complete  Palliative Care Screening Complete  Skilled Nursing Facility Complete

## 2023-02-22 NOTE — Consult Note (Addendum)
WOC Nurse Consult Note: patient known to WOC team from previous admissions  Reason for Consult: sacral wound  Wound type: 1.  Healing Stage 4 Pressure Injury Sacrum  2. Healing Stage 4 Pressure Injury R ischium  3.  Full thickness skin loss L knee 4.  Full thickness skin loss R posterior lower leg  5. Partial thickness skin loss x 2 L anterior lower leg  Pressure Injury POA: Yes Measurement: 1.  Healing Stage 4 PI Sacrum 6 cm x 7 cm 75% pink and moist 25% yellow devitalized tissue 2.  Healing Stage 4 R ischium 5 cm x 3 cm x 1.5 cm 100% pink and moist  3.  L knee full thickness 1 cm x 1 cm x 0.1 cm 75% pink and moist 25% yellow  4.  R posterior leg full thickness 6 cm x 3 cm 75% pink and moist 25% yellow  5.  L anterior lower leg 2 separate areas partial thickness skin loss 0.5 cm x 0.5 cm each   Drainage (amount, consistency, odor) minimal serosanguinous from L knee, L anterior lower leg, R posterior leg and sacrum  Periwound: skin surrounding lower leg wounds intact;  buttocks and sacrum appearance of chronic tissue damage (chronic discoloration and peeling skin)  Dressing procedure/placement/frequency:  Clean sacral wound with NS, apply Medihoney to yellow tissue, cover with dry gauze and silicone foam. May lift foam daily to reapply Medihoney. Change foam dressing q3 days and prn soiling.  2.  Clean L knee, L anterior leg and R posterior leg wounds with NS, apply Xeroform gauze [name] [phone]) cut to fit wound beds daily, cover with Telfa and secure with Kerlix roll gauze starting just above toes and ending just below knee.  May secure with Ace bandage [name] [phone]) applied in same fashion as Kerlix.  3.  Clean R ischial wound with NS, apply Silver Hydrofiber [name] [phone]) to wound bed daily and secure with Silicone foam. May lift foam daily to reapply Silver, change foam dressing q3 days and prn soiling.   Patient should remain on low air loss mattress throughout hospitalization.   POC  discussed with patient and bedside nurse. WOC team will not follow at this time. Re-consult if further needs arise.   Thanks you,    [employer] MSN, RN-BC, [employer] [phone]

## 2023-02-22 NOTE — Progress Notes (Signed)
Pharmacy Antibiotic Note  Brent [name]. is a 77 y.o. male admitted on 02/20/2023 with UTI.  Pharmacy has been consulted for cefepime dosing.  Pt has a hx of quadraplegia from transverse myelitis. He has a chronic urinary cath. It grew out morganella. Since this organism could produce AmpC, we will use empiric cefepime for coverage.  Scr <1  Plan: Cefepime 2g IV q12  Height: 6' (182.9 cm) IBW/kg (Calculated) : 77.6  Temp (24hrs), Avg:98.1 F (36.7 C), Min:97.6 F (36.4 C), Max:98.2 F (36.8 C)  Recent Labs  Lab 02/21/23 0030 02/21/23 0348 02/21/23 0734 02/22/23 0636  WBC 9.7  --   --  6.5  CREATININE 0.93  --   --  0.60*  LATICACIDVEN 3.0* 2.9* 2.5*  --     CrCl cannot be calculated (Unknown ideal weight.).    Allergies  Allergen Reactions   Gabapentin Other (See Comments)    Unknown   Levofloxacin Rash   Penicillins Hives and Rash    Tolerated Unasyn, Augmentin, cefepime and cephalexin    Antimicrobials this admission: 6/5 cefepime>>  Dose adjustments this admission:   Microbiology results: 6/4 blood>>ngtd 6/4 urine>>morganella  MRSA+  Ulyses Southward, PharmD, BCIDP, AAHIVP, CPP Infectious Disease Pharmacist 02/22/2023 11:40 AM

## 2023-02-22 NOTE — Plan of Care (Signed)

## 2023-02-23 DIAGNOSIS — A419 Sepsis, unspecified organism: Secondary | ICD-10-CM | POA: Diagnosis not present

## 2023-02-23 LAB — URINE CULTURE: Culture: >=100000 — AB

## 2023-02-23 LAB — PROCALCITONIN: Procalcitonin: <0.1 ng/mL

## 2023-02-23 LAB — C-REACTIVE PROTEIN: CRP: 6.3 mg/dL — ABNORMAL HIGH (ref <1.0)

## 2023-02-23 LAB — CBC WITH DIFFERENTIAL/PLATELET
Abs Immature Granulocytes: 0.05 10*3/uL (ref 0.00–0.07)
Basophils Absolute: 0 10*3/uL (ref 0.0–0.1)
Basophils Relative: 1 %
Eosinophils Absolute: 0.1 10*3/uL (ref 0.0–0.5)
Eosinophils Relative: 2 %
HCT: 32.9 % — ABNORMAL LOW (ref 39.0–52.0)
Hemoglobin: 10.6 g/dL — ABNORMAL LOW (ref 13.0–17.0)
Immature Granulocytes: 1 %
Lymphocytes Relative: 24 %
Lymphs Abs: 1.3 10*3/uL (ref 0.7–4.0)
MCH: 27.8 pg (ref 26.0–34.0)
MCHC: 32.2 g/dL (ref 30.0–36.0)
MCV: 86.4 fL (ref 80.0–100.0)
Monocytes Absolute: 0.6 10*3/uL (ref 0.1–1.0)
Monocytes Relative: 10 %
Neutro Abs: 3.3 10*3/uL (ref 1.7–7.7)
Neutrophils Relative %: 62 %
Platelets: 221 10*3/uL (ref 150–400)
RBC: 3.81 MIL/uL — ABNORMAL LOW (ref 4.22–5.81)
RDW: 15.9 % — ABNORMAL HIGH (ref 11.5–15.5)
WBC: 5.3 10*3/uL (ref 4.0–10.5)
nRBC: 0 % (ref 0.0–0.2)

## 2023-02-23 LAB — CULTURE, BLOOD (ROUTINE X 2)

## 2023-02-23 LAB — BASIC METABOLIC PANEL
Anion gap: 11 (ref 5–15)
BUN: 11 mg/dL (ref 8–23)
CO2: 22 mmol/L (ref 22–32)
Calcium: 8.7 mg/dL — ABNORMAL LOW (ref 8.9–10.3)
Chloride: 100 mmol/L (ref 98–111)
Creatinine, Ser: 0.65 mg/dL (ref 0.61–1.24)
GFR, Estimated: >60 mL/min (ref >60)
Glucose, Bld: 240 mg/dL — ABNORMAL HIGH (ref 70–99)
Potassium: 3.8 mmol/L (ref 3.5–5.1)
Sodium: 133 mmol/L — ABNORMAL LOW (ref 135–145)

## 2023-02-23 LAB — GLUCOSE, CAPILLARY
Glucose-Capillary: 216 mg/dL — ABNORMAL HIGH (ref 70–99)
Glucose-Capillary: 310 mg/dL — ABNORMAL HIGH (ref 70–99)
Glucose-Capillary: 242 mg/dL — ABNORMAL HIGH (ref 70–99)
Glucose-Capillary: 326 mg/dL — ABNORMAL HIGH (ref 70–99)

## 2023-02-23 LAB — BRAIN NATRIURETIC PEPTIDE: B Natriuretic Peptide: 77 pg/mL (ref 0.0–100.0)

## 2023-02-23 LAB — MAGNESIUM: Magnesium: 1.8 mg/dL (ref 1.7–2.4)

## 2023-02-23 MED ORDER — SULFAMETHOXAZOLE-TRIMETHOPRIM 800-160 MG PO TABS
1.0000 | ORAL_TABLET | Freq: Two times a day (BID) | ORAL | Status: DC
  Administered 2023-02-23 (×2): 1 via ORAL
  Filled 2023-02-23 (×3): qty 1

## 2023-02-23 NOTE — Evaluation (Signed)
Clinical/Bedside Swallow Evaluation Patient Details  Name: Brent [name]. MRN: [id number] Date of Birth: 08/06/1946  Today's Date: 02/23/2023 Time: SLP Start Time (ACUTE ONLY): 1100 SLP Stop Time (ACUTE ONLY): 1120 SLP Time Calculation (min) (ACUTE ONLY): 20 min  Past Medical History:  Past Medical History:  Diagnosis Date   Allergy    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 angioplasty of his RCA in 1990. b. bare metal stent placed in the RCA in 2000 followed by rotational atherectomy shortly after for stent restenosis. c. last cath was in 2012 showing stable moderate diffuse CAD. (70% mid LAD, 80% diagonal, 70% Ramus, 40% mid to distal RCA stent restenosis). d. Low risk nuc in 2015.   Diabetes mellitus    Diverticulosis    Elevated CK    Erectile dysfunction    Hemorrhoids    HTN (hypertension)    Hyperlipidemia    Hypertriglyceridemia    Malignant melanoma of left side of neck (HCC) 10/25/2018   Myocardial infarction (HCC)    Obesity    OSA (obstructive sleep apnea)    Persistent disorder of initiating or maintaining sleep    Personal history of colonic polyps 02/05/2003   Renal lesion 06/20/2016   Past Surgical History:  Past Surgical History:  Procedure Laterality Date   BIOPSY  03/26/2022   Procedure: BIOPSY;  Surgeon: Iva Boop, MD;  Location: [facility] ENDOSCOPY;  Service: Gastroenterology;;   CHOLECYSTECTOMY     CORONARY ANGIOPLASTY     CORONARY STENT PLACEMENT     stenting of the right coronary artery with followup rotational  atherectomy. (3 stents placed)   ENDOSCOPIC MUCOSAL RESECTION  10/13/2022   Procedure: ENDOSCOPIC MUCOSAL RESECTION;  Surgeon: Meridee Score Netty Starring., MD;  Location: [facility] ENDOSCOPY;  Service: Gastroenterology;;   ESOPHAGOGASTRODUODENOSCOPY (EGD) WITH PROPOFOL N/A 03/26/2022   Procedure: ESOPHAGOGASTRODUODENOSCOPY (EGD) WITH PROPOFOL;  Surgeon: Iva Boop, MD;  Location: [facility] ENDOSCOPY;   Service: Gastroenterology;  Laterality: N/A;   ESOPHAGOGASTRODUODENOSCOPY (EGD) WITH PROPOFOL N/A 07/11/2022   Procedure: ESOPHAGOGASTRODUODENOSCOPY (EGD) WITH PROPOFOL;  Surgeon: Meridee Score Netty Starring., MD;  Location: [facility] ENDOSCOPY;  Service: Gastroenterology;  Laterality: N/A;   ESOPHAGOGASTRODUODENOSCOPY (EGD) WITH PROPOFOL N/A 10/13/2022   Procedure: ESOPHAGOGASTRODUODENOSCOPY (EGD) WITH PROPOFOL;  Surgeon: Meridee Score Netty Starring., MD;  Location: [facility] ENDOSCOPY;  Service: Gastroenterology;  Laterality: N/A;   EUS N/A 10/13/2022   Procedure: UPPER ENDOSCOPIC ULTRASOUND (EUS) RADIAL;  Surgeon: Lemar Lofty., MD;  Location: [facility] ENDOSCOPY;  Service: Gastroenterology;  Laterality: N/A;   FINGER SURGERY     right   FOOT SURGERY     right   HEMOSTASIS CLIP PLACEMENT  07/11/2022   Procedure: HEMOSTASIS CLIP PLACEMENT;  Surgeon: Meridee Score Netty Starring., MD;  Location: [facility] ENDOSCOPY;  Service: Gastroenterology;;  ovesco    HEMOSTASIS CLIP PLACEMENT  10/13/2022   Procedure: HEMOSTASIS CLIP PLACEMENT;  Surgeon: Lemar Lofty., MD;  Location: [facility] ENDOSCOPY;  Service: Gastroenterology;;   HOT HEMOSTASIS N/A 07/11/2022   Procedure: HOT HEMOSTASIS (ARGON PLASMA COAGULATION/BICAP);  Surgeon: Lemar Lofty., MD;  Location: [facility] ENDOSCOPY;  Service: Gastroenterology;  Laterality: N/A;   HOT HEMOSTASIS N/A 10/13/2022   Procedure: HOT HEMOSTASIS (ARGON PLASMA COAGULATION/BICAP);  Surgeon: Lemar Lofty., MD;  Location: [facility] ENDOSCOPY;  Service: Gastroenterology;  Laterality: N/A;   INGUINAL HERNIA REPAIR     right   IR FLUORO PROCEDURE UNLISTED  02/21/2023   IR GASTROSTOMY TUBE REMOVAL  02/10/2022   IR REPLC GASTRO/COLONIC  TUBE PERCUT W/FLUORO  12/22/2022   IR REPLC GASTRO/COLONIC TUBE PERCUT W/FLUORO  01/26/2023   IRRIGATION AND DEBRIDEMENT ABSCESS N/A 06/04/2020   Procedure: IRRIGATION AND DEBRIDEMENT SACRAL WOUND;  Surgeon: Diamantina Monks, MD;  Location: [facility] OR;  Service: General;   Laterality: N/A;   LAPAROSCOPY N/A 06/04/2020   Procedure: LAPAROSCOPY ASSISTED COLOSTOMY, LAPAROSCOPY GASTROSTOMY;  Surgeon: Diamantina Monks, MD;  Location: [facility] OR;  Service: General;  Laterality: N/A;   LYSIS OF ADHESION N/A 06/04/2020   Procedure: LYSIS OF ADHESION;  Surgeon: Diamantina Monks, MD;  Location: [facility] OR;  Service: General;  Laterality: N/A;   melanoma removal     neck   ORTHOPEDIC SURGERY     foot right   POLYPECTOMY  10/13/2022   Procedure: POLYPECTOMY;  Surgeon: Lemar Lofty., MD;  Location: [facility] ENDOSCOPY;  Service: Gastroenterology;;   rotator cuff surg     Bil   STENT REMOVAL  10/13/2022   Procedure: CLIP REMOVAL;  Surgeon: Lemar Lofty., MD;  Location: [facility] ENDOSCOPY;  Service: Gastroenterology;;   SUBMUCOSAL LIFTING INJECTION  10/13/2022   Procedure: SUBMUCOSAL LIFTING INJECTION;  Surgeon: Lemar Lofty., MD;  Location: [facility] ENDOSCOPY;  Service: Gastroenterology;;   SUBMUCOSAL TATTOO INJECTION  10/13/2022   Procedure: SUBMUCOSAL TATTOO INJECTION;  Surgeon: Lemar Lofty., MD;  Location: [facility] ENDOSCOPY;  Service: Gastroenterology;;   HPI:  77 y.o. 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    Assessment / Plan / Recommendation  Clinical Impression  Pt demonstrates no signs of dysphagia. Reports he had a G tube after paresis in 2019 due to prolonged period of altered mental status. SLP fed pt an omelete, toast, juice and observed pt taking meds with RN. All WNL. Soft diet is for easy digestion.  Can resume any texture of food when medically ready per MD. SLP will sign off. SLP Visit Diagnosis: Dysphagia, unspecified (R13.10)    Aspiration Risk  Mild aspiration risk    Diet Recommendation Regular;Thin liquid   Liquid Administration via: Cup;Straw Medication Administration: Whole meds with liquid Supervision: Full supervision/cueing for compensatory strategies;Staff to assist with self feeding    Other  Recommendations      Recommendations for follow up therapy are one component of a multi-disciplinary discharge planning process, led by the attending physician.  Recommendations may be updated based on patient status, additional functional criteria and insurance authorization.  Follow up Recommendations        Assistance Recommended at Discharge    Functional Status Assessment    Frequency and Duration            Prognosis        Swallow Study   General HPI: 77 y.o. 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 Type  of Study: Bedside Swallow Evaluation Previous Swallow Assessment: none in chart Diet Prior to this Study: Other (Comment) (soft/thin) Temperature Spikes Noted: No Respiratory Status: Room air History of Recent Intubation: No Behavior/Cognition: Alert;Cooperative;Pleasant mood Oral Cavity Assessment: Within Functional Limits Oral Care Completed by SLP: No Oral Cavity - Dentition: Adequate natural dentition Vision: Functional for self-feeding Self-Feeding Abilities: Total assist Patient Positioning:  Upright in bed Baseline Vocal Quality: Normal Volitional Cough: Strong Volitional Swallow: Able to elicit    Oral/Motor/Sensory Function Overall Oral Motor/Sensory Function: Within functional limits   Ice Chips     Thin Liquid Thin Liquid: Within functional limits    Nectar Thick Nectar Thick Liquid: Not tested   Honey Thick Honey Thick Liquid: Not tested   Puree Puree: Within functional limits   Solid     Solid: Within functional limits      Brent [name], Riley Nearing 02/23/2023,12:00 PM

## 2023-02-23 NOTE — Progress Notes (Signed)
PROGRESS NOTE                                                                                                                                                                                                             Patient Demographics:    Brent [name], is a 77 y.o. male, DOB - August 09, 1946, ZOX:[id number]  Outpatient Primary MD for the patient is [name], MD    LOS - 2  Admit date - 02/20/2023    Chief Complaint  Patient presents with   Weakness       Brief Narrative (HPI from H&P)   77 y.o. 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   Suprapubic catheter is due to be changed soon, workup here consistent with UTI present on admission and he was admitted to the hospital.   Subjective:   Patient in bed, appears comfortable, denies any headache, no fever, no chest pain or pressure, no shortness of breath , no abdominal pain. No new focal weakness.   Assessment  & Plan :   Complicated UTI present on admission in the presence of indwelling suprapubic catheter, sepsis.  Prelim cultures noted, antibiotics adjusted, placed on IV fluids, suprapubic catheter will be changed on 02/22/2024 as it is due to be changed in the next few days, monitor closely.  Clinically better.  Sepsis pathophysiology has clinically resolved.  Acute metabolic encephalopathy.  Due to #1 above.  Much improved, no headache, CT head unremarkable, close to baseline.  Functional quadriplegia  secondary to transverse myelitis, history of neuromyelitis optica.  Chronically bedbound SNF bound patient.  Supportive care.  Gastrocutaneous fistula.  At the site of previous G-tube site, closed by IR on 02/21/2023.  GERD.  On Pepcid.  Sacral decubitus ulcer present on admission.  Wound care consulted, kindly see wound care notes for details.  HTN - stable on B Blocker, ARB.  Chronic normocytic anemia.  Supportive care and monitor.  Hyponatremia.  Due to dehydration improved  with IV fluids.  DM type II.  Continue present regimen of Lantus and sliding scale and monitor.  CBG (last 3)  Recent Labs    02/22/23 1610 02/22/23 2113 02/23/23 0822  GLUCAP 287* 194* 216*   Lab Results  Component Value Date   HGBA1C 8.6 (H) 02/22/2023         Condition - Extremely Guarded  Family Communication  :  None present  Code Status :  Full  Consults  :  IR  PUD Prophylaxis : Pepcid   Procedures  :     CT head.  Unremarkable.    IR 02/20/22 - Successful Fluoroscopy-guided GC fistula evaluation and percutaneous ECF closure with [facility] Plug.   Plan:  Post Procedure Precautions:  Dietary restrictions. Please MUST observe a liquid diet for the first 48 hours, followed by a high fiber diet  Expect some drainage for up to 16 weeks after the procedure  Stool softener is recommended  Use of over-the-counter strength pain medicine is OK after the procedure  No strenuous physical activity for at least 6 weeks after procedure  No lifting of items over 10 pounds for at least 6 weeks after procedure       Disposition Plan  :    Status is: Inpatient   DVT Prophylaxis  :    enoxaparin (LOVENOX) injection 40 mg Start: 02/21/23 2200 SCDs Start: 02/21/23 0523   Lab Results  Component Value Date   PLT 221 02/23/2023    Diet :  Diet Order             DIET SOFT Fluid consistency: Thin  Diet effective now                    Inpatient Medications  Scheduled  Meds:  ascorbic acid  500 mg Oral Daily   atenolol  12.5 mg Oral q morning   atenolol  25 mg Oral QPM   atorvastatin  20 mg Oral QHS   B-complex with vitamin C  1 tablet Oral Daily   baclofen  10 mg Oral TID   Chlorhexidine Gluconate Cloth  6 each Topical Daily   cholecalciferol  1,000 Units Oral Daily   docusate sodium  200 mg Oral Daily   DULoxetine  30 mg Oral QHS   enoxaparin (LOVENOX) injection  40 mg Subcutaneous Q24H   famotidine  20 mg Oral BID   insulin aspart  0-20 Units Subcutaneous TID WC   insulin aspart  0-5 Units Subcutaneous QHS   insulin aspart  4 Units Subcutaneous TID WC   insulin glargine-yfgn  40 Units Subcutaneous Daily   lamoTRIgine  25 mg Oral TID   leptospermum manuka honey  1 Application Topical Daily   loratadine  10 mg Oral Daily   losartan  75 mg Oral Daily   magnesium oxide  400 mg Oral BID   melatonin  3 mg Oral QHS   nystatin  1 Application Topical Daily   senna-docusate  1 tablet Oral BID   sodium chloride flush  3 mL Intravenous Q12H   sulfamethoxazole-trimethoprim  1 tablet Oral Q12H   traZODone  75 mg Oral QHS   Continuous Infusions:  ceFEPime (MAXIPIME) IV 2 g (02/22/23 2124)   PRN Meds:.acetaminophen **OR** acetaminophen, albuterol, ondansetron **OR** ondansetron (ZOFRAN) IV, polyvinyl alcohol, traMADol  Antibiotics  :    Anti-infectives (From admission, onward)    Start     Dose/Rate Route Frequency Ordered Stop   02/23/23 1000  sulfamethoxazole-trimethoprim (BACTRIM DS) 800-160 MG per tablet 1 tablet        1 tablet Oral Every 12 hours 02/23/23 0912     02/22/23 2200  ceFEPIme (MAXIPIME) 2 g in sodium chloride 0.9 % 100 mL IVPB        2 g 200 mL/hr over 30 Minutes Intravenous Every 12 hours 02/22/23 1137     02/22/23 1215  ceFEPIme (MAXIPIME) 2 g in sodium chloride 0.9 % 100 mL IVPB        2 g 200 mL/hr over 30 Minutes Intravenous  Once 02/22/23 1126 02/22/23 1248   02/21/23 1200  ceFAZolin (ANCEF) IVPB 1 g/50 mL premix        1  g 100 mL/hr over 30 Minutes Intravenous  Once 02/21/23 1152 02/21/23 1425   02/21/23 0930  cefTRIAXone (ROCEPHIN) 2 g in sodium chloride 0.9 % 100 mL IVPB  Status:  Discontinued        2 g 200 mL/hr over 30 Minutes Intravenous Every 24 hours 02/21/23 0918 02/22/23 1100         Objective:   Vitals:   02/22/23 2000 02/22/23 2313 02/23/23 0319 02/23/23 0800  BP: (!) 149/57 (!) 122/48 (!) 124/49   Pulse: 60 60 (!) 54   Resp: 16 12 17    Temp: 97.8 F (36.6 C) 98 F (36.7 C) 97.9 F (36.6 C) 97.7 F (36.5 C)  TempSrc: Oral Oral Oral Oral  SpO2: 97% 91% 91%   Height:        Wt Readings from Last 3 Encounters:  10/13/22 97.5 kg  07/11/22 93 kg  05/26/22 90.3 kg     Intake/Output Summary (Last 24 hours) at 02/23/2023 0913 Last data filed at 02/23/2023 0600 Gross per 24 hour  Intake 360 ml  Output 2850 ml  Net -2490 ml     Physical Exam  Awake Alert, No new F.N deficits, chronic diffuse generalized weakness due to Devic's disease, suprapubic catheter in place Victor.AT,PERRAL Supple Neck, No JVD,   Symmetrical Chest wall movement, Good air movement bilaterally, CTAB RRR,No Gallops,Rubs or new Murmurs,  +ve B.Sounds, Abd Soft, No tenderness,   No Cyanosis, Clubbing or edema     RN pressure injury documentation: Pressure Injury 02/22/23 (Active)  02/22/23 0800  Location:   Location Orientation:   Staging:   Wound Description (Comments):   Present on Admission: No      Data Review:    Recent Labs  Lab 02/21/23 0030 02/22/23 0636 02/23/23 0717  WBC 9.7 6.5 5.3  HGB 11.5* 10.9* 10.6*  HCT 35.8* 33.7* 32.9*  PLT 268 253 221  MCV 89.7 87.1 86.4  MCH 28.8 28.2 27.8  MCHC 32.1 32.3 32.2  RDW 15.9* 15.6* 15.9*  LYMPHSABS 1.4  --  1.3  MONOABS 0.7  --  0.6  EOSABS 0.2  --  0.1  BASOSABS 0.0  --  0.0    Recent Labs  Lab 02/21/23 0030 02/21/23 0348 02/21/23 0734 02/21/23 2129 02/21/23 2129 02/22/23 0636 02/22/23 1206 02/23/23 0717  NA 135  --   --   --    --  131*  --  133*  K 5.1  --   --   --   --  4.1  --  3.8  CL 99  --   --   --   --  99  --  100  CO2 21*  --   --   --   --  22  --  22  ANIONGAP 15  --   --   --   --  10  --  11  GLUCOSE 256*  --   --   --   --  259*  --  240*  BUN 24*  --   --   --   --  15  --  11  CREATININE 0.93  --   --   --   --  0.60*  --  0.65  AST 20  --   --   --   --   --   --   --   ALT 24  --   --   --   --   --   --   --   ALKPHOS 78  --   --   --   --   --   --   --   BILITOT 0.5  --   --   --   --   --   --   --   ALBUMIN 2.9*  --   --   --   --   --   --   --   CRP  --   --   --  8.4*  --  6.1*  --  6.3*  PROCALCITON  --   --   --   --   --  <0.10  --   --   LATICACIDVEN 3.0* 2.9* 2.5*  --   --   --   --   --   INR 1.1  --   --   --   --   --   --   --   HGBA1C  --   --   --  8.8*   < >  --  8.6*  --   BNP  --   --   --   --   --  91.3  --  77.0  MG  --   --   --   --   --  1.6*  --  1.8  CALCIUM 9.2  --   --   --   --  8.7*  --  8.7*   < > = values in this interval not displayed.     Radiology Reports DG Chest Port 1 View  Result Date: 02/22/2023 CLINICAL DATA:  Shortness of breath. EXAM: PORTABLE CHEST 1 VIEW COMPARISON:  February 21, 2023. FINDINGS: Stable cardiomediastinal silhouette. Both lungs are clear. The visualized skeletal structures are unremarkable. IMPRESSION: No active disease. Electronically Signed   By: Lupita Raider M.D.   On: 02/22/2023 10:16   IR Fluoro Procedure Unlisted  Result Date: 02/21/2023 CLINICAL DATA:  Persistent gastrocutaneous fistula at G-tube site. Briefly, 77 year old male with a history of surgically placed gastrostomy (06/04/2020), with subsequent removal on 02/10/2022. Patient with persistent drainage and gastrostomy tract with concern for GC fistula. Prior endoscopic attempts at closure with OTSC/Ovesco clips (07/11/2022 and 10/13/2022) were unsuccessful. EXAM: IR FLUORO PROCEDURE UNLISTED Procedures: 1. GASTROCUTANEOUS FISTULA EVALUATION 2. PERCUTANEOUS  ENTEROCUTANEOUS FISTULA CLOSURE COMPARISON:  IR fluoroscopy, 12/22/2022 and 01/26/2023. CT AP, 12/08/2022. CONTRAST:  10 mL Omnipaque 300-administered via the percutaneous drainage catheter. MEDICATIONS: Ancef 1 g IV. Preprocedure antibiotics administered within 60 minutes of procedure. ANESTHESIA/SEDATION: Moderate (conscious) sedation was employed during this procedure. A total of Versed 2 mg and Fentanyl 100 mcg was administered intravenously. Moderate Sedation Time: 41 minutes. The patient's level of consciousness and vital signs were monitored continuously by  radiology nursing throughout the procedure under my direct supervision. FLUOROSCOPY TIME:  Fluoroscopic dose; 42 mGy TECHNIQUE: Informed consent was obtained from the patient and/or patient's representative following explanation of the procedure, risks, benefits and alternatives. All questions were addressed. A time out was performed prior to the initiation of the procedure. Maximal barrier sterile technique utilized including caps, mask, sterile gowns, sterile gloves, large sterile drape, hand hygiene, and sterile prep. The patient was positioned supine on the fluoroscopy table. The external portion of the existing gastrostomy/percutaneous catheter as well as the surrounding skin was prepped and draped in usual sterile fashion. A preprocedural spot fluoroscopic image was obtained of the existing percutaneous catheter. A small amount of contrast was injected via the existing percutaneous catheter and several fluoroscopic images were obtained in various obliquities. The external portion of the percutaneous catheter was cut and cannulated with a short Amplatz wire. Under intermittent fluoroscopic guidance, the existing percutaneous catheter was exchanged for a 9 Fr short sheath then a cytology brush introduced over wire was used to debride the epithelialized catheter tract. The sheath and brush were removed. The tract was irrigated with saline. Serial tract  dilation with dilators up to 22 Fr was performed. A Cook 22 French dry-type seal sheath was introduced through the tract into the stomach, then the [employer] ECF plug was released in stomach then retracted to the abdominal wall. The plug was rehydrated with 40 mL saline, and the sheath was removed. The plug was cut to size and while under tension was secured by a Molnar disc to the anterior abdominal wall. Fluoroscopic images were acquired demonstrating plug apposition with appropriate tension. The patient tolerated the procedure well without immediate postprocedural complication. FINDINGS: 1. 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 2. Uncomplicated percutaneous ECF closure with [facility] plug. IMPRESSION: Successful percutaneous gastrocutaneous fistula closure with a Cook [employer] enterocutaneous plug, as above. RECOMMENDATIONS: Post Procedure Precautions:. Dietary restrictions. Patient MUST observe a liquid diet for the first 48 hours, followed by a high fiber diet. Expect some drainage for up to 16 weeks after the procedure. Stool softener is recommended. Use of over-the-counter strength pain medicine is OK after the procedure. No strenuous physical activity for at least 6 weeks after procedure. No lifting of items over 10 pounds for at least 6 weeks after procedure PLAN: The patient will return to Vascular Interventional Radiology (VIR) for re-evaluation at the ambulatory clinic in 6 weeks. Roanna Banning, MD [facility] Radiology Electronically Signed   By: Roanna Banning M.D.   On: 02/21/2023 13:58   CT HEAD WO CONTRAST ( )  Result Date: 02/21/2023 CLINICAL DATA:  Altered mental status EXAM: CT HEAD WITHOUT CONTRAST TECHNIQUE: Contiguous axial images were obtained from the base of the skull through the vertex without intravenous contrast. RADIATION DOSE REDUCTION: This exam was performed according to the departmental dose-optimization program  which includes automated exposure control, adjustment of the mA and/or kV according to patient size and/or use of iterative reconstruction technique. COMPARISON:  08/06/2021 FINDINGS: Brain: No evidence of acute infarction, hemorrhage, hydrocephalus, extra-axial collection or mass lesion/mass effect. Stable arachnoid cyst is noted in the posterior fossa on the right. Mild atrophic changes are noted. Chronic white matter ischemic changes are seen as well. Vascular: No hyperdense vessel or unexpected calcification. Skull: Normal. Negative for fracture or focal lesion. Sinuses/Orbits: No acute finding. Other: None. IMPRESSION: Chronic changes without acute abnormality. Electronically Signed   By: Alcide Clever M.D.   On: 02/21/2023 01:33  DG Chest Port 1 View  Result Date: 02/21/2023 CLINICAL DATA:  Possible sepsis EXAM: PORTABLE CHEST 1 VIEW COMPARISON:  03/25/2022 FINDINGS: Cardiac shadow is enlarged. Lungs are clear bilaterally. No bony abnormality is seen. IMPRESSION: No active disease. Electronically Signed   By: Alcide Clever M.D.   On: 02/21/2023 01:10      Signature  -   Susa Raring M.D on 02/23/2023 at 9:13 AM   -  To page go to [email or web address]

## 2023-02-24 DIAGNOSIS — A419 Sepsis, unspecified organism: Secondary | ICD-10-CM | POA: Diagnosis not present

## 2023-02-24 LAB — GLUCOSE, CAPILLARY
Glucose-Capillary: 364 mg/dL — ABNORMAL HIGH (ref 70–99)
Glucose-Capillary: 273 mg/dL — ABNORMAL HIGH (ref 70–99)
Glucose-Capillary: 225 mg/dL — ABNORMAL HIGH (ref 70–99)
Glucose-Capillary: 276 mg/dL — ABNORMAL HIGH (ref 70–99)

## 2023-02-24 LAB — CULTURE, BLOOD (ROUTINE X 2): Culture: NO GROWTH

## 2023-02-24 LAB — URINE CULTURE

## 2023-02-24 MED ORDER — INSULIN GLARGINE-YFGN 100 UNIT/ML ~~LOC~~ SOLN
60.0000 [IU] | Freq: Every day | SUBCUTANEOUS | Status: DC
  Filled 2023-02-24: qty 0.6

## 2023-02-24 MED ORDER — INSULIN GLARGINE-YFGN 100 UNIT/ML ~~LOC~~ SOLN
45.0000 [IU] | Freq: Every day | SUBCUTANEOUS | Status: DC
  Filled 2023-02-24: qty 0.45

## 2023-02-24 MED ORDER — SODIUM CHLORIDE 0.9 % IV SOLN
1.0000 g | Freq: Three times a day (TID) | INTRAVENOUS | Status: DC
  Filled 2023-02-24: qty 20

## 2023-02-24 MED ORDER — CIPROFLOXACIN HCL 500 MG PO TABS: 500.0000 mg | ORAL_TABLET | Freq: Two times a day (BID) | ORAL | Status: DC

## 2023-02-24 MED ORDER — INSULIN GLARGINE-YFGN 100 UNIT/ML ~~LOC~~ SOLN
15.0000 [IU] | Freq: Once | SUBCUTANEOUS | Status: AC
  Administered 2023-02-24: 15 [IU] via SUBCUTANEOUS
  Filled 2023-02-24: qty 0.15

## 2023-02-24 MED ORDER — CIPROFLOXACIN HCL 500 MG PO TABS
500.0000 mg | ORAL_TABLET | Freq: Two times a day (BID) | ORAL | Status: DC
  Administered 2023-02-24 – 2023-02-25 (×3): 500 mg via ORAL
  Filled 2023-02-24 (×3): qty 1

## 2023-02-24 NOTE — Plan of Care (Signed)

## 2023-02-24 NOTE — Progress Notes (Signed)
PROGRESS NOTE                                                                                                                                                                                                             Patient Demographics:    Brent [name], is a 77 y.o. male, DOB - 1945-12-06, ZOX:[id number]  Outpatient Primary MD for the patient is [name], MD    LOS - 3  Admit date - 02/20/2023    Chief Complaint  Patient presents with   Weakness       Brief Narrative (HPI from H&P)   77 y.o. 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   Suprapubic catheter is due to be changed soon, workup here consistent with UTI present on admission and he was admitted to the hospital.   Subjective:   Patient in bed, appears comfortable, denies any headache, no fever, no chest pain or pressure, no shortness of breath , no abdominal pain. No new focal weakness.   Assessment  & Plan :   Complicated UTI present on admission in the presence of indwelling suprapubic catheter, sepsis.  Prelim cultures noted, await final sensitivity, antibiotics adjusted, placed on IV fluids, suprapubic catheter changed on 02/22/2024 as it is due to be changed in the next few days, monitor closely.  Clinically better.  Sepsis pathophysiology has clinically resolved.  Acute metabolic encephalopathy.  Due to #1 above.  Much improved, no headache, CT head unremarkable, close to baseline.  Functional  quadriplegia secondary to transverse myelitis, history of neuromyelitis optica.  Chronically bedbound SNF bound patient.  Supportive care.  Gastrocutaneous fistula.  At the site of previous G-tube site, closed by IR on 02/21/2023.  GERD.  On Pepcid.  Sacral decubitus ulcer present on admission.  Wound care consulted, kindly see wound care notes for details.  HTN - stable on B Blocker, ARB.  Chronic normocytic anemia.  Supportive care and monitor.  Hyponatremia.  Due to dehydration  improved with IV fluids.  DM type II.  Insulin regimen adjusted further on 02/24/2023 for better control, on extremely high doses of insulin at home.  CBG (last 3)  Recent Labs    02/23/23 1618 02/23/23 2046 02/24/23 0817  GLUCAP 242* 326* 364*   Lab Results  Component Value Date   HGBA1C 8.6 (H) 02/22/2023         Condition - Extremely Guarded  Family Communication  :  None present  Code Status :  Full  Consults  :  IR  PUD Prophylaxis : Pepcid   Procedures  :     CT head.  Unremarkable.    IR 02/20/22 - Successful Fluoroscopy-guided GC fistula evaluation and percutaneous ECF closure with [facility] Plug.   Plan:  Post Procedure Precautions:  Dietary restrictions. Please MUST observe a liquid diet for the first 48 hours, followed by a high fiber diet  Expect some drainage for up to 16 weeks after the procedure  Stool softener is recommended  Use of over-the-counter strength pain medicine is OK after the procedure  No strenuous physical activity for at least 6 weeks after procedure  No lifting of items over 10 pounds for at least 6 weeks after procedure       Disposition Plan  :    Status is: Inpatient   DVT Prophylaxis  :    enoxaparin (LOVENOX) injection 40 mg Start: 02/21/23 2200 SCDs Start: 02/21/23 0523   Lab Results  Component Value Date   PLT 221 02/23/2023    Diet :  Diet Order             Diet heart healthy/carb modified Room service appropriate? Yes;  Fluid consistency: Thin  Diet effective now                    Inpatient Medications  Scheduled Meds:  ascorbic acid  500 mg Oral Daily   atenolol  12.5 mg Oral q morning   atenolol  25 mg Oral QPM   atorvastatin  20 mg Oral QHS   B-complex with vitamin C  1 tablet Oral Daily   baclofen  10 mg Oral TID   Chlorhexidine Gluconate Cloth  6 each Topical Daily   cholecalciferol  1,000 Units Oral Daily   ciprofloxacin  500 mg Oral BID   docusate sodium  200 mg Oral Daily   DULoxetine  30 mg Oral QHS   enoxaparin (LOVENOX) injection  40 mg Subcutaneous Q24H   famotidine  20 mg Oral BID   insulin aspart  0-20 Units Subcutaneous TID WC   insulin aspart  0-5 Units Subcutaneous QHS   insulin aspart  4 Units Subcutaneous TID WC   insulin glargine-yfgn  15 Units Subcutaneous Once   insulin glargine-yfgn  60 Units Subcutaneous Daily   lamoTRIgine  25 mg Oral TID   leptospermum manuka honey  1 Application Topical Daily   loratadine  10 mg Oral Daily   losartan  75 mg Oral Daily   magnesium oxide  400 mg Oral BID   melatonin  3 mg Oral QHS   nystatin  1 Application Topical Daily   senna-docusate  1 tablet Oral BID   sodium chloride flush  3 mL Intravenous Q12H   traZODone  75 mg Oral QHS   Continuous Infusions:   PRN Meds:.acetaminophen **OR** acetaminophen, albuterol, ondansetron **OR** ondansetron (ZOFRAN) IV, polyvinyl alcohol, traMADol  Antibiotics  :    Anti-infectives (From admission, onward)  Start     Dose/Rate Route Frequency Ordered Stop   02/24/23 0915  ciprofloxacin (CIPRO) tablet 500 mg        500 mg Oral 2 times daily 02/24/23 0827 03/03/23 0759   02/24/23 0800  ciprofloxacin (CIPRO) tablet 500 mg  Status:  Discontinued        500 mg Oral 2 times daily 02/24/23 0559 02/24/23 0740   02/24/23 0800  meropenem (MERREM) 1 g in sodium chloride 0.9 % 100 mL IVPB  Status:  Discontinued        1 g 200 mL/hr over 30 Minutes Intravenous Every 8 hours 02/24/23 0740  02/24/23 0828   02/23/23 1000  sulfamethoxazole-trimethoprim (BACTRIM DS) 800-160 MG per tablet 1 tablet  Status:  Discontinued        1 tablet Oral Every 12 hours 02/23/23 0912 02/24/23 0559   02/22/23 2200  ceFEPIme (MAXIPIME) 2 g in sodium chloride 0.9 % 100 mL IVPB  Status:  Discontinued        2 g 200 mL/hr over 30 Minutes Intravenous Every 12 hours 02/22/23 1137 02/23/23 1205   02/22/23 1215  ceFEPIme (MAXIPIME) 2 g in sodium chloride 0.9 % 100 mL IVPB        2 g 200 mL/hr over 30 Minutes Intravenous  Once 02/22/23 1126 02/22/23 1248   02/21/23 1200  ceFAZolin (ANCEF) IVPB 1 g/50 mL premix        1 g 100 mL/hr over 30 Minutes Intravenous  Once 02/21/23 1152 02/21/23 1425   02/21/23 0930  cefTRIAXone (ROCEPHIN) 2 g in sodium chloride 0.9 % 100 mL IVPB  Status:  Discontinued        2 g 200 mL/hr over 30 Minutes Intravenous Every 24 hours 02/21/23 0918 02/22/23 1100         Objective:   Vitals:   02/23/23 2044 02/24/23 0027 02/24/23 0453 02/24/23 0819  BP: (!) 168/86 (!) 130/58 (!) 149/63 (!) 141/57  Pulse: 68 68 65 (!) 54  Resp: 18 18 18 18   Temp: 98.2 F (36.8 C) 98.3 F (36.8 C) 98.3 F (36.8 C) 97.7 F (36.5 C)  TempSrc: Oral Oral Oral Oral  SpO2:    92%  Height:        Wt Readings from Last 3 Encounters:  10/13/22 97.5 kg  07/11/22 93 kg  05/26/22 90.3 kg     Intake/Output Summary (Last 24 hours) at 02/24/2023 0955 Last data filed at 02/24/2023 0454 Gross per 24 hour  Intake 483 ml  Output 1575 ml  Net -1092 ml     Physical Exam  Awake Alert, No new F.N deficits, chronic diffuse generalized weakness due to Devic's disease, suprapubic catheter in place [place].AT,PERRAL Supple Neck, No JVD,   Symmetrical Chest wall movement, Good air movement bilaterally, CTAB RRR,No Gallops,Rubs or new Murmurs,  +ve B.Sounds, Abd Soft, No tenderness,   No Cyanosis, Clubbing or edema     RN pressure injury documentation: Pressure Injury 02/22/23 (Active)  02/22/23 0800   Location:   Location Orientation:   Staging:   Wound Description (Comments):   Present on Admission: No  Dressing Type Foam - Lift dressing to assess site every shift (Xeroform) 02/23/23 2000      Data Review:    Recent Labs  Lab 02/21/23 0030 02/22/23 0636 02/23/23 0717  WBC 9.7 6.5 5.3  HGB 11.5* 10.9* 10.6*  HCT 35.8* 33.7* 32.9*  PLT 268 253 221  MCV 89.7 87.1 86.4  MCH 28.8 28.2  27.8  MCHC 32.1 32.3 32.2  RDW 15.9* 15.6* 15.9*  LYMPHSABS 1.4  --  1.3  MONOABS 0.7  --  0.6  EOSABS 0.2  --  0.1  BASOSABS 0.0  --  0.0    Recent Labs  Lab 02/21/23 0030 02/21/23 0348 02/21/23 0734 02/21/23 2129 02/21/23 2129 02/22/23 0636 02/22/23 1206 02/23/23 0717  NA 135  --   --   --   --  131*  --  133*  K 5.1  --   --   --   --  4.1  --  3.8  CL 99  --   --   --   --  99  --  100  CO2 21*  --   --   --   --  22  --  22  ANIONGAP 15  --   --   --   --  10  --  11  GLUCOSE 256*  --   --   --   --  259*  --  240*  BUN 24*  --   --   --   --  15  --  11  CREATININE 0.93  --   --   --   --  0.60*  --  0.65  AST 20  --   --   --   --   --   --   --   ALT 24  --   --   --   --   --   --   --   ALKPHOS 78  --   --   --   --   --   --   --   BILITOT 0.5  --   --   --   --   --   --   --   ALBUMIN 2.9*  --   --   --   --   --   --   --   CRP  --   --   --  8.4*  --  6.1*  --  6.3*  PROCALCITON  --   --   --   --   --  <0.10  --  <0.10  LATICACIDVEN 3.0* 2.9* 2.5*  --   --   --   --   --   INR 1.1  --   --   --   --   --   --   --   HGBA1C  --   --   --  8.8*   < >  --  8.6*  --   BNP  --   --   --   --   --  91.3  --  77.0  MG  --   --   --   --   --  1.6*  --  1.8  CALCIUM 9.2  --   --   --   --  8.7*  --  8.7*   < > = values in this interval not displayed.     Radiology Reports DG Chest Port 1 View  Result Date: 02/22/2023 CLINICAL DATA:  Shortness of breath. EXAM: PORTABLE CHEST 1 VIEW COMPARISON:  February 21, 2023. FINDINGS: Stable cardiomediastinal silhouette. Both  lungs are clear. The visualized skeletal structures are unremarkable. IMPRESSION: No active disease. Electronically Signed   By: Lupita Raider M.D.   On: 02/22/2023 10:16   IR Fluoro Procedure Unlisted  Result Date:  02/21/2023 CLINICAL DATA:  Persistent gastrocutaneous fistula at G-tube site. Briefly, 77 year old male with a history of surgically placed gastrostomy (06/04/2020), with subsequent removal on 02/10/2022. Patient with persistent drainage and gastrostomy tract with concern for GC fistula. Prior endoscopic attempts at closure with OTSC/Ovesco clips (07/11/2022 and 10/13/2022) were unsuccessful. EXAM: IR FLUORO PROCEDURE UNLISTED Procedures: 1. GASTROCUTANEOUS FISTULA EVALUATION 2. PERCUTANEOUS ENTEROCUTANEOUS FISTULA CLOSURE COMPARISON:  IR fluoroscopy, 12/22/2022 and 01/26/2023. CT AP, 12/08/2022. CONTRAST:  10 mL Omnipaque 300-administered via the percutaneous drainage catheter. MEDICATIONS: Ancef 1 g IV. Preprocedure antibiotics administered within 60 minutes of procedure. ANESTHESIA/SEDATION: Moderate (conscious) sedation was employed during this procedure. A total of Versed 2 mg and Fentanyl 100 mcg was administered intravenously. Moderate Sedation Time: 41 minutes. The patient's level of consciousness and vital signs were monitored continuously by radiology nursing throughout the procedure under my direct supervision. FLUOROSCOPY TIME:  Fluoroscopic dose; 42 mGy TECHNIQUE: Informed consent was obtained from the patient and/or patient's representative following explanation of the procedure, risks, benefits and alternatives. All questions were addressed. A time out was performed prior to the initiation of the procedure. Maximal barrier sterile technique utilized including caps, mask, sterile gowns, sterile gloves, large sterile drape, hand hygiene, and sterile prep. The patient was positioned supine on the fluoroscopy table. The external portion of the existing gastrostomy/percutaneous catheter as  well as the surrounding skin was prepped and draped in usual sterile fashion. A preprocedural spot fluoroscopic image was obtained of the existing percutaneous catheter. A small amount of contrast was injected via the existing percutaneous catheter and several fluoroscopic images were obtained in various obliquities. The external portion of the percutaneous catheter was cut and cannulated with a short Amplatz wire. Under intermittent fluoroscopic guidance, the existing percutaneous catheter was exchanged for a 9 Fr short sheath then a cytology brush introduced over wire was used to debride the epithelialized catheter tract. The sheath and brush were removed. The tract was irrigated with saline. Serial tract dilation with dilators up to 22 Fr was performed. A Cook 22 French dry-type seal sheath was introduced through the tract into the stomach, then the Biodesign ECF plug was released in stomach then retracted to the abdominal wall. The plug was rehydrated with 40 mL saline, and the sheath was removed. The plug was cut to size and while under tension was secured by a Molnar disc to the anterior abdominal wall. Fluoroscopic images were acquired demonstrating plug apposition with appropriate tension. The patient tolerated the procedure well without immediate postprocedural complication. FINDINGS: 1. 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 2. Uncomplicated percutaneous ECF closure with [facility] plug. IMPRESSION: Successful percutaneous gastrocutaneous fistula closure with a Cook Biodesign enterocutaneous plug, as above. RECOMMENDATIONS: Post Procedure Precautions:. Dietary restrictions. Patient MUST observe a liquid diet for the first 48 hours, followed by a high fiber diet. Expect some drainage for up to 16 weeks after the procedure. Stool softener is recommended. Use of over-the-counter strength pain medicine is OK after the procedure. No strenuous physical activity for at least 6 weeks after  procedure. No lifting of items over 10 pounds for at least 6 weeks after procedure PLAN: The patient will return to Vascular Interventional Radiology (VIR) for re-evaluation at the ambulatory clinic in 6 weeks. Roanna Banning, MD [facility] Electronically Signed   By: Roanna Banning M.D.   On: 02/21/2023 13:58   CT HEAD WO CONTRAST ( )  Result Date: 02/21/2023 CLINICAL DATA:  Altered mental status EXAM: CT HEAD WITHOUT  CONTRAST TECHNIQUE: Contiguous axial images were obtained from the base of the skull through the vertex without intravenous contrast. RADIATION DOSE REDUCTION: This exam was performed according to the departmental dose-optimization program which includes automated exposure control, adjustment of the mA and/or kV according to patient size and/or use of iterative reconstruction technique. COMPARISON:  08/06/2021 FINDINGS: Brain: No evidence of acute infarction, hemorrhage, hydrocephalus, extra-axial collection or mass lesion/mass effect. Stable arachnoid cyst is noted in the posterior fossa on the right. Mild atrophic changes are noted. Chronic white matter ischemic changes are seen as well. Vascular: No hyperdense vessel or unexpected calcification. Skull: Normal. Negative for fracture or focal lesion. Sinuses/Orbits: No acute finding. Other: None. IMPRESSION: Chronic changes without acute abnormality. Electronically Signed   By: Alcide Clever M.D.   On: 02/21/2023 01:33   DG Chest Port 1 View  Result Date: 02/21/2023 CLINICAL DATA:  Possible sepsis EXAM: PORTABLE CHEST 1 VIEW COMPARISON:  03/25/2022 FINDINGS: Cardiac shadow is enlarged. Lungs are clear bilaterally. No bony abnormality is seen. IMPRESSION: No active disease. Electronically Signed   By: Alcide Clever M.D.   On: 02/21/2023 01:10      Signature  -   Susa Raring M.D on 02/24/2023 at 9:55 AM   -  To page go to [email or web address]

## 2023-02-24 NOTE — Progress Notes (Signed)
Pharmacy Antibiotic Note  Jeret Goyer. is a 77 y.o. male admitted on 02/20/2023 with UTI related to indwelling supratherapeutic cathether; urine cx has returned growing M.morganella and P.aeruginosa sensitive to ciprofloxacin, gentamicin, imipenem, and pip/tazo.  Pharmacy has been consulted for meropenem dosing.  Pt has documented allergy to levofloxacin (rash) that occurred during an admission in 2021.  Plan: Meropenem 1g IV Q8H.  Height: 6' (182.9 cm) IBW/kg (Calculated) : 77.6  Temp (24hrs), Avg:98.1 F (36.7 C), Min:97.7 F (36.5 C), Max:98.3 F (36.8 C)  Recent Labs  Lab 02/21/23 0030 02/21/23 0348 02/21/23 0734 02/22/23 0636 02/23/23 0717  WBC 9.7  --   --  6.5 5.3  CREATININE 0.93  --   --  0.60* 0.65  LATICACIDVEN 3.0* 2.9* 2.5*  --   --     Allergies  Allergen Reactions   Gabapentin Other (See Comments)    Unknown   Levofloxacin Rash   Penicillins Hives and Rash    Tolerated Unasyn, Augmentin, cefepime and cephalexin    Thank you for allowing pharmacy to be a part of this patient's care.  Vernard Gambles, PharmD, BCPS  02/24/2023 7:25 AM

## 2023-02-25 DIAGNOSIS — A419 Sepsis, unspecified organism: Secondary | ICD-10-CM | POA: Diagnosis not present

## 2023-02-25 LAB — GLUCOSE, CAPILLARY: Glucose-Capillary: 290 mg/dL — ABNORMAL HIGH (ref 70–99)

## 2023-02-25 MED ORDER — OLOPATADINE HCL 0.1 % OP SOLN
1.0000 [drp] | Freq: Two times a day (BID) | OPHTHALMIC | Status: DC
  Filled 2023-02-25: qty 5

## 2023-02-25 MED ORDER — INSULIN GLARGINE-YFGN 100 UNIT/ML ~~LOC~~ SOLN
65.0000 [IU] | Freq: Every day | SUBCUTANEOUS | Status: DC
  Administered 2023-02-25: 65 [IU] via SUBCUTANEOUS
  Filled 2023-02-25: qty 0.65

## 2023-02-25 MED ORDER — CIPROFLOXACIN HCL 500 MG PO TABS: 500.0000 mg | ORAL_TABLET | Freq: Two times a day (BID) | ORAL | 0 refills | Status: DC

## 2023-02-25 MED ORDER — INSULIN ASPART 100 UNIT/ML IJ SOLN
6.0000 [IU] | Freq: Three times a day (TID) | INTRAMUSCULAR | Status: DC
  Administered 2023-02-25: 6 [IU] via SUBCUTANEOUS

## 2023-02-25 MED ORDER — OLOPATADINE HCL 0.1 % OP SOLN: 1.0000 [drp] | Freq: Two times a day (BID) | OPHTHALMIC | 12 refills | Status: DC

## 2023-02-25 NOTE — Progress Notes (Signed)
Report given to Leonette Most Hardly, Charity fundraiser at [employer]. PIV removed on patient, foley bag emptied. Awaiting transport from [facility]!

## 2023-02-25 NOTE — Discharge Instructions (Signed)
Follow with Primary MD [name], MD in 7 days   Get CBC, CMP, 2 view Chest X ray -  checked next visit with your primary MD or SNF MD    Activity: As tolerated with Full fall precautions use walker/cane & assistance as needed  Disposition SNF  Diet: Heart Healthy Low Carb, check CBGs q. [facility].  Special Instructions: If you have smoked or chewed Tobacco  in the last 2 yrs please stop smoking, stop any regular Alcohol  and or any Recreational drug use.  On your next visit with your primary care physician please Get Medicines reviewed and adjusted.  Please request your Prim.MD to go over all Hospital Tests and Procedure/Radiological results at the follow up, please get all Hospital records sent to your Prim MD by signing hospital release before you go home.  If you experience worsening of your admission symptoms, develop shortness of breath, life threatening emergency, suicidal or homicidal thoughts you must seek medical attention immediately by calling 911 or calling your MD immediately  if symptoms less severe.  You Must read complete instructions/literature along with all the possible adverse reactions/side effects for all the Medicines you take and that have been prescribed to you. Take any new Medicines after you have completely understood and accpet all the possible adverse reactions/side effects.

## 2023-02-25 NOTE — Discharge Summary (Addendum)
Brent [name]. ZOX:[id number] DOB: 1946-09-14 DOA: 02/20/2023  PCP: [name], MD  Admit date: 02/20/2023  Discharge date: 02/25/2023  Admitted From: SNF   Disposition:  SNF   Recommendations for Outpatient Follow-up:   Follow up with PCP in 1-2 weeks  PCP Please obtain BMP/CBC, 2 view CXR in 1week,  (see Discharge instructions)   PCP Please follow up on the following pending results:    Home Health: None   Equipment/Devices: None  Consultations: None  Discharge Condition: Stable    CODE STATUS: Full    Diet Recommendation: Heart Healthy Low Carb, check CBGs q. ACH S.    Chief Complaint  Patient presents with   Weakness     Brief history of present illness from the day of admission and additional interim summary    77 y.o. 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    Suprapubic catheter is due to be changed soon, workup here consistent with UTI present on admission and he was admitted to the hospital.                                                                 Hospital Course   Complicated UTI present on admission in the presence of indwelling suprapubic catheter, sepsis.  Urine cultures and sensitivity noted, he has been placed on appropriate medications and has responded very well to amp around continue Cipro for another few days with  stop date of 03/03/2023, suprapubic catheter was due to be changed and it was changed this admission here.  Currently symptom-free follow-up with primary urologist postdischarge in the next 1 to 2 weeks, continue changing suprapubic catheter per nursing home regimen every 4 weeks.    Acute metabolic encephalopathy.  Due to #1 above.  Much improved, no headache, CT head unremarkable, close to baseline.   Functional quadriplegia secondary to transverse myelitis, history of neuromyelitis optica.  Chronically bedbound SNF bound patient.  Supportive care.   Gastrocutaneous fistula.  At the site of previous G-tube site, closed by IR on 02/21/2023.   GERD.  On Pepcid.   Sacral decubitus ulcer present on admission.  Wound care consulted, kindly see wound care notes for details.   HTN - stable on B Blocker, ARB.   Chronic normocytic anemia.  Supportive care and monitor.   Hyponatremia.  Due to dehydration improved with IV fluids.   DM type II.  Home regimen continued.  Discharge diagnosis     Principal Problem:   Sepsis (HCC) Active Problems:   Complicated urinary tract infection   Acute metabolic encephalopathy   Lactic acidosis   Type 2 diabetes mellitus with complication, with long-term current use of insulin (HCC)   Normocytic anemia   Gastrocutaneous fistula   Essential hypertension   Weakness   Dehydration    Discharge instructions    Discharge Instructions     Diet - low sodium heart healthy   Complete by: As directed    Discharge instructions   Complete by: As directed    Follow with Primary MD [name], MD in 7 days   Get CBC, CMP, 2 view Chest X ray -  checked next visit with your primary MD or SNF MD    Activity: As tolerated with Full fall precautions use walker/cane & assistance as needed  Disposition SNF  Diet: Heart Healthy Low Carb, check CBGs q. ACH S.  Special Instructions: If you have smoked or chewed Tobacco  in the last 2 yrs please stop  smoking, stop any regular Alcohol  and or any Recreational drug use.  On your next visit with your primary care physician please Get Medicines reviewed and adjusted.  Please request your Prim.MD to go over all Hospital Tests and Procedure/Radiological results at the follow up, please get all Hospital records sent to your Prim MD by signing hospital release before you go home.  If you experience worsening of your admission symptoms, develop shortness of breath, life threatening emergency, suicidal or homicidal thoughts you must seek medical attention immediately by calling 911 or calling your MD immediately  if symptoms less severe.  You Must read complete instructions/literature along with all the possible adverse reactions/side effects for all the Medicines you take and that have been prescribed to you. Take any new Medicines after you have completely understood and accpet all the possible adverse reactions/side effects.   Discharge wound care:   Complete by: As directed    Wound care  Daily      Comments: Clean L knee, L anterior leg and R posterior leg wounds with NS, apply Xeroform gauze [name] [phone]) cut to fit wound beds daily, cover with Telfa and secure with Kerlix roll gauze starting just above toes and ending just below knee.  May secure with Ace bandage [name] [phone]) applied in same fashion as Kerlix.        02/23/23 0500    Wound care  Daily      Comments: Clean R ischial wound with NS, apply Silver Hydrofiber [name] [phone]) to wound bed daily and secure with Silicone foam. May lift foam daily to reapply Silver, change foam dressing q3 days and prn soiling.      Wound care  Daily      Comments: Clean sacral wound with NS, apply Medihoney to yellow tissue, cover with dry gauze and silicone foam. May lift foam daily to reapply Medihoney. Change foam dressing q3 days and prn soiling.   Increase activity slowly   Complete by: As directed        Discharge Medications    Allergies as of 02/25/2023       Reactions   Gabapentin Other (See Comments)   Unknown   Levofloxacin Rash   Penicillins Hives, Rash   Tolerated Unasyn, Augmentin, cefepime and cephalexin        Medication List     STOP taking these medications    cephALEXin 250 MG capsule Commonly  known as: KEFLEX       TAKE these medications    Alogliptin Benzoate 25 MG Tabs Take 25 mg by mouth daily.   ascorbic acid 500 MG tablet Commonly known as: VITAMIN C Take 500 mg by mouth daily.   atenolol 25 MG tablet Commonly known as: TENORMIN Take 12.5-25 mg by mouth See admin instructions. Take 12.5 mg (1/2 tablet) by mouth once daily in the morning and 25 mg (1 tablet) at bedtime.   atorvastatin 20 MG tablet Commonly known as: LIPITOR Take 1 tablet (20 mg total) by mouth daily. What changed: when to take this   B COMPLEX VITAMINS PO Take 1 capsule by mouth daily.   baclofen 10 MG tablet Commonly known as: LIORESAL Take 10 mg by mouth 3 (three) times daily.   Biofreeze 4 % Gel Generic drug: Menthol (Topical Analgesic) Apply 1 application  topically 2 (two) times daily as needed (Pain to back upper and lower arms and shoulders).   cholecalciferol 25 MCG (1000 UNIT) tablet Commonly known as: VITAMIN D3 Take 1,000 Units by mouth daily.   ciprofloxacin 500 MG tablet Commonly known as: CIPRO Take 1 tablet (500 mg total) by mouth 2 (two) times daily.   DULoxetine 30 MG capsule Commonly known as: CYMBALTA Take 30 mg by mouth at bedtime.   famotidine 20 MG tablet Commonly known as: PEPCID Take 20 mg by mouth 2 (two) times daily.   feeding supplement (PRO-STAT 64) Liqd Take 30 mLs by mouth in the morning and at bedtime.   fexofenadine 180 MG tablet Commonly known as: ALLEGRA Take 180 mg by mouth daily.   Fish Oil 1000 MG Caps Take 1,000 mg by mouth daily.   gentamicin cream 0.1 % Commonly known as: GARAMYCIN Apply 1 Application topically daily. To right 4th digit  cuticle after washing with soap and water.   GenTeal Tears Severe Day/Night 0.4-0.3 % Gel ophthalmic gel Generic drug: Polyethyl Glycol-Propyl Glycol Place 1 Application into both eyes 4 (four) times daily as needed (Dry Eyes).   insulin aspart 100 UNIT/ML injection Commonly known as: novoLOG Inject 0-15 Units into the skin every 4 (four) hours. Sliding scale  CBG 70 - 120: 0 units: CBG 121 - 150: 2 units; CBG 151 - 200: 3 units; CBG 201 - 250: 5 units; CBG 251 - 300: 8 units;CBG 301 - 350: 11 units; CBG 351 - 400: 15 units; CBG > 400 : 15 units and notify MD What changed:  how much to take when to take this additional instructions   insulin glargine 100 unit/mL Sopn Commonly known as: LANTUS Inject 15 Units into the skin at bedtime. What changed:  how much to take when to take this   lamoTRIgine 25 MG tablet Commonly known as: LAMICTAL Take 25 mg by mouth 3 (three) times daily. For 5 days. Started 02/18/23.   losartan 50 MG tablet Commonly known as: COZAAR Take 75 mg by mouth daily.   Magnesium Oxide (Elemental) 400 MG Tabs Take 400 mg by mouth 2 (two) times daily.   melatonin 3 MG Tabs tablet Take 3 mg by mouth at bedtime.   metFORMIN 500 MG tablet Commonly known as: GLUCOPHAGE Take 500 mg by mouth 2 (two) times daily with a meal.   nystatin powder Generic drug: nystatin Apply 1 Application topically daily. Apply to peri-wound bed to sacrum and right ischium daily with routine dressing.   olopatadine 0.1 % ophthalmic solution Commonly known as: PATANOL Place 1 drop into both  eyes 2 (two) times daily.   omeprazole 40 MG capsule Commonly known as: PRILOSEC Take 1 capsule (40 mg total) by mouth 2 (two) times daily. 30 minutes before breakfast/dinner What changed: additional instructions   ondansetron 4 MG tablet Commonly known as: ZOFRAN Take 4 mg by mouth every 8 (eight) hours as needed for nausea.   PSYLLIUM PO Take 0.8 g by mouth in the morning and at  bedtime.   senna-docusate 8.6-50 MG tablet Commonly known as: Senokot-S Take 1 tablet by mouth 2 (two) times daily.   spironolactone 25 MG tablet Commonly known as: ALDACTONE Take 25 mg by mouth daily.   traMADol 50 MG tablet Commonly known as: ULTRAM Take 50 mg by mouth daily as needed (Pain).   traZODone 50 MG tablet Commonly known as: DESYREL Take 75 mg by mouth at bedtime.               Discharge Care Instructions  (From admission, onward)           Start     Ordered   02/25/23 0000  Discharge wound care:       Comments: Wound care  Daily      Comments: Clean L knee, L anterior leg and R posterior leg wounds with NS, apply Xeroform gauze [name] [phone]) cut to fit wound beds daily, cover with Telfa and secure with Kerlix roll gauze starting just above toes and ending just below knee.  May secure with Ace bandage [name] [phone]) applied in same fashion as Kerlix.        02/23/23 0500    Wound care  Daily      Comments: Clean R ischial wound with NS, apply Silver Hydrofiber [name] [phone]) to wound bed daily and secure with Silicone foam. May lift foam daily to reapply Silver, change foam dressing q3 days and prn soiling.      Wound care  Daily      Comments: Clean sacral wound with NS, apply Medihoney to yellow tissue, cover with dry gauze and silicone foam. May lift foam daily to reapply Medihoney. Change foam dressing q3 days and prn soiling.   02/25/23 0847             Contact information for follow-up providers     Roanna Banning, MD Follow up.   Specialties: Interventional Radiology, Diagnostic Radiology, Radiology Why: IR scheduler will call you to setup follow up appointment/date time (about 6 weeks after your procedure). Please call [phone] with questions or concerns prior to your appointment. Contact information: [address] [place] [address] [phone]         [name], MD. Schedule an appointment as soon as  possible for a visit in 1 week(s).   Specialty: Family Medicine Contact information: [address] [place] [address] [phone]              Contact information for after-discharge care     Destination     [facility], [facility] Preferred SNF .   Service: Skilled Nursing Contact information: [address] [place] [address] [phone]                     Major procedures and Radiology Reports - PLEASE review detailed and final reports thoroughly  -       DG Chest [facility] 1 View  Result Date: 02/22/2023 CLINICAL DATA:  Shortness of breath. EXAM: PORTABLE CHEST 1 VIEW COMPARISON:  February 21, 2023. FINDINGS: Stable cardiomediastinal silhouette. Both lungs are clear. The visualized skeletal structures are unremarkable. IMPRESSION: No active disease. Electronically Signed   By: Lupita Raider M.D.   On: 02/22/2023 10:16   IR Fluoro Procedure Unlisted  Result Date: 02/21/2023 CLINICAL DATA:  Persistent gastrocutaneous fistula at G-tube site. Briefly, 77 year old male with a history of surgically placed gastrostomy (06/04/2020), with subsequent removal on 02/10/2022. Patient with persistent drainage and gastrostomy tract with concern for GC fistula. Prior endoscopic attempts at closure with OTSC/Ovesco clips (07/11/2022 and 10/13/2022) were unsuccessful. EXAM: IR FLUORO PROCEDURE UNLISTED Procedures: 1. GASTROCUTANEOUS FISTULA EVALUATION 2. PERCUTANEOUS ENTEROCUTANEOUS FISTULA CLOSURE COMPARISON:  IR fluoroscopy, 12/22/2022 and 01/26/2023. CT AP, 12/08/2022. CONTRAST:  10 mL Omnipaque 300-administered via the percutaneous drainage catheter. MEDICATIONS: Ancef 1 g IV. Preprocedure antibiotics administered within 60 minutes of procedure. ANESTHESIA/SEDATION: Moderate (conscious) sedation was employed during this procedure. A total of Versed 2 mg and Fentanyl 100 mcg was administered intravenously. Moderate Sedation Time: 41 minutes. The patient's  level of consciousness and vital signs were monitored continuously by radiology nursing throughout the procedure under my direct supervision. FLUOROSCOPY TIME:  Fluoroscopic dose; 42 mGy TECHNIQUE: Informed consent was obtained from the patient and/or patient's representative following explanation of the procedure, risks, benefits and alternatives. All questions were addressed. A time out was performed prior to the initiation of the procedure. Maximal barrier sterile technique utilized including caps, mask, sterile gowns, sterile gloves, large sterile drape, hand hygiene, and sterile prep. The patient was positioned supine on the fluoroscopy table. The external portion of the existing gastrostomy/percutaneous catheter as well as the surrounding skin was prepped and draped in usual sterile fashion. A preprocedural spot fluoroscopic image was obtained of the existing percutaneous catheter. A small amount of contrast was injected via the existing percutaneous catheter and several fluoroscopic images were obtained in various obliquities. The external portion of the percutaneous catheter was cut and cannulated with a short Amplatz wire. Under intermittent fluoroscopic guidance, the existing percutaneous catheter was exchanged for a 9 Fr short sheath then a cytology brush introduced over wire was used to debride the epithelialized catheter tract. The sheath and brush were removed. The tract was irrigated with saline. Serial tract dilation with dilators up to 22 Fr was performed. A Cook 22 French dry-type seal sheath was introduced through the tract into the stomach, then the Biodesign ECF plug was released in stomach then retracted to the abdominal wall. The plug was rehydrated with 40 mL saline, and the sheath was removed. The plug was cut to size and while under tension was secured by a Molnar disc to the anterior abdominal wall. Fluoroscopic images were acquired demonstrating plug apposition with appropriate tension.  The patient tolerated the procedure well without immediate postprocedural complication. FINDINGS: 1. 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 2. Uncomplicated percutaneous ECF closure with [facility] plug. IMPRESSION: Successful percutaneous gastrocutaneous fistula closure with a Cook Biodesign enterocutaneous plug, as above. RECOMMENDATIONS: Post Procedure Precautions:. Dietary restrictions. Patient MUST observe a liquid diet for the first 48 hours, followed by a high fiber diet. Expect some drainage for up to 16 weeks after the procedure. Stool softener is recommended. Use of over-the-counter strength pain medicine is OK after the procedure. No strenuous physical activity for at least 6 weeks after procedure. No lifting of items over 10 pounds for at least 6 weeks after procedure PLAN: The patient will return to Vascular Interventional Radiology (VIR) for re-evaluation at the ambulatory clinic in 6 weeks.  Roanna Banning, MD [facility] Radiology Electronically Signed   By: Roanna Banning M.D.   On: 02/21/2023 13:58   CT HEAD WO CONTRAST ( )  Result Date: 02/21/2023 CLINICAL DATA:  Altered mental status EXAM: CT HEAD WITHOUT CONTRAST TECHNIQUE: Contiguous axial images were obtained from the base of the skull through the vertex without intravenous contrast. RADIATION DOSE REDUCTION: This exam was performed according to the departmental dose-optimization program which includes automated exposure control, adjustment of the mA and/or kV according to patient size and/or use of iterative reconstruction technique. COMPARISON:  08/06/2021 FINDINGS: Brain: No evidence of acute infarction, hemorrhage, hydrocephalus, extra-axial collection or mass lesion/mass effect. Stable arachnoid cyst is noted in the posterior fossa on the right. Mild atrophic changes are noted. Chronic white matter ischemic changes are seen as well. Vascular: No hyperdense vessel or  unexpected calcification. Skull: Normal. Negative for fracture or focal lesion. Sinuses/Orbits: No acute finding. Other: None. IMPRESSION: Chronic changes without acute abnormality. Electronically Signed   By: Alcide Clever M.D.   On: 02/21/2023 01:33   DG Chest Port 1 View  Result Date: 02/21/2023 CLINICAL DATA:  Possible sepsis EXAM: PORTABLE CHEST 1 VIEW COMPARISON:  03/25/2022 FINDINGS: Cardiac shadow is enlarged. Lungs are clear bilaterally. No bony abnormality is seen. IMPRESSION: No active disease. Electronically Signed   By: Alcide Clever M.D.   On: 02/21/2023 01:10   IR Replc Gastro/Colonic Tube Percut W/Fluoro  Result Date: 01/26/2023 INDICATION: Indwelling 16 French balloon retention gastrostomy tube has completely dislodged. This has been used to keep the tract open prior to planned elective repair of a persistent gastrocutaneous fistula with a special order enterocutaneous plug. EXAM: REPLACEMENT OF GASTROSTOMY TUBE UNDER FLUOROSCOPY MEDICATIONS: None ANESTHESIA/SEDATION: None CONTRAST:  20 mL Omnipaque 300-administered into the gastric lumen. FLUOROSCOPY TIME:  Fluoroscopy Time: 6 seconds.  9.0 mGy. COMPLICATIONS: None immediate. PROCEDURE: Informed written consent was obtained from the patient after a thorough discussion of the procedural risks, benefits and alternatives. All questions were addressed. Maximal Sterile Barrier Technique was utilized including caps, mask, sterile gowns, sterile gloves, sterile drape, hand hygiene and skin antiseptic. A timeout was performed prior to the initiation of the procedure. A 16 French balloon retention gastrostomy tube was advanced through the pre-existing tract. Retention balloon was inflated with 7 mL of saline. Contrast injection confirms intraluminal positioning within the body of the stomach. IMPRESSION: Replacement of 16 French balloon retention gastrostomy tube under fluoroscopy. The patient is scheduled for elective gastrocutaneous fistula closure  with a special order closure plug in early June. Electronically Signed   By: Irish Lack M.D.   On: 01/26/2023 12:57    Micro Results     Recent Results (from the past 240 hour(s))  Blood Culture (routine x 2)     Status: None (Preliminary result)   Collection Time: 02/21/23 12:30 AM   Specimen: BLOOD  Result Value Ref Range Status   Specimen Description BLOOD SITE NOT SPECIFIED  Final   Special Requests   Final    BOTTLES DRAWN AEROBIC AND ANAEROBIC Blood Culture results may not be optimal due to an excessive volume of blood received in culture bottles   Culture   Final    NO GROWTH 4 DAYS Performed at [facility], [address], [place], [place] [address]    Report Status PENDING  Incomplete  Blood Culture (routine x 2)     Status: None (Preliminary result)   Collection Time: 02/21/23 12:30 AM  Specimen: BLOOD  Result Value Ref Range Status   Specimen Description BLOOD SITE NOT SPECIFIED  Final   Special Requests   Final    BOTTLES DRAWN AEROBIC AND ANAEROBIC Blood Culture results may not be optimal due to an excessive volume of blood received in culture bottles   Culture   Final    NO GROWTH 4 DAYS Performed at [facility], [address] [place], [place] [address]    Report Status PENDING  Incomplete  Culture, Urine (Do not remove urinary catheter, catheter placed by urology or difficult to place)     Status: Abnormal   Collection Time: 02/21/23 12:30 AM   Specimen: Urine, Catheterized  Result Value Ref Range Status   Specimen Description URINE, CATHETERIZED  Final   Special Requests   Final    NONE Performed at [facility], [address], [place], [place] [address]    Culture (A)  Final    >=100,000 COLONIES/mL MORGANELLA MORGANII 60,000 COLONIES/mL PSEUDOMONAS AERUGINOSA    Report Status 02/24/2023 FINAL  Final   Organism ID, Bacteria MORGANELLA MORGANII (A)  Final   Organism ID, Bacteria PSEUDOMONAS AERUGINOSA (A)  Final       Susceptibility   Morganella morganii - MIC*    AMPICILLIN >=32 RESISTANT Resistant     CIPROFLOXACIN <=0.25 SENSITIVE Sensitive     GENTAMICIN <=1 SENSITIVE Sensitive     IMIPENEM 1 SENSITIVE Sensitive     NITROFURANTOIN 128 RESISTANT Resistant     TRIMETH/SULFA <=20 SENSITIVE Sensitive     AMPICILLIN/SULBACTAM >=32 RESISTANT Resistant     PIP/TAZO <=4 SENSITIVE Sensitive     * >=100,000 COLONIES/mL MORGANELLA MORGANII   Pseudomonas aeruginosa - MIC*    CEFTAZIDIME 4 SENSITIVE Sensitive     CIPROFLOXACIN <=0.25 SENSITIVE Sensitive     GENTAMICIN <=1 SENSITIVE Sensitive     IMIPENEM 2 SENSITIVE Sensitive     PIP/TAZO 16 SENSITIVE Sensitive     CEFEPIME 2 SENSITIVE Sensitive     * 60,000 COLONIES/mL PSEUDOMONAS AERUGINOSA  MRSA Next Gen by PCR, Nasal     Status: Abnormal   Collection Time: 02/22/23  5:53 AM   Specimen: Nasal Mucosa; Nasal Swab  Result Value Ref Range Status   MRSA by PCR Next Gen DETECTED (A) NOT DETECTED Final    Comment: RESULT CALLED TO, READ BACK BY AND VERIFIED WITH:  PENNY [id number] @1123  BY SM (NOTE) The GeneXpert MRSA Assay (FDA approved for NASAL specimens only), is one component of a comprehensive MRSA colonization surveillance program. It is not intended to diagnose MRSA infection nor to guide or monitor treatment for MRSA infections. Test performance is not FDA approved in patients less than 39 years old. Performed at [facility], [address], [place], [place] [address]     Today   Subjective    Brent [name] today has no headache,no chest abdominal pain,no new weakness tingling or numbness, feels much better wants to go home today.     Objective   Blood pressure (!) 157/68, pulse 61, temperature 98 F (36.7 C), temperature source Oral, resp. rate 19, height 6' (1.829 m), SpO2 93 %.   Intake/Output Summary (Last 24 hours) at 02/25/2023 1058 Last data filed at 02/25/2023 0500 Gross per 24 hour  Intake 360 ml  Output 1100 ml  Net -740  ml    Exam  Awake Alert, No new F.N deficits,    [place].AT,PERRAL Supple Neck,   Symmetrical Chest wall movement,  Good air movement bilaterally, CTAB RRR,No Gallops,   +ve B.Sounds, Abd Soft, Non tender,  No Cyanosis, Clubbing or edema    Data Review   Recent Labs  Lab 02/21/23 0030 02/22/23 0636 02/23/23 0717  WBC 9.7 6.5 5.3  HGB 11.5* 10.9* 10.6*  HCT 35.8* 33.7* 32.9*  PLT 268 253 221  MCV 89.7 87.1 86.4  MCH 28.8 28.2 27.8  MCHC 32.1 32.3 32.2  RDW 15.9* 15.6* 15.9*  LYMPHSABS 1.4  --  1.3  MONOABS 0.7  --  0.6  EOSABS 0.2  --  0.1  BASOSABS 0.0  --  0.0    Recent Labs  Lab 02/21/23 0030 02/21/23 0348 02/21/23 0734 02/21/23 2129 02/21/23 2129 02/22/23 0636 02/22/23 1206 02/23/23 0717  NA 135  --   --   --   --  131*  --  133*  K 5.1  --   --   --   --  4.1  --  3.8  CL 99  --   --   --   --  99  --  100  CO2 21*  --   --   --   --  22  --  22  ANIONGAP 15  --   --   --   --  10  --  11  GLUCOSE 256*  --   --   --   --  259*  --  240*  BUN 24*  --   --   --   --  15  --  11  CREATININE 0.93  --   --   --   --  0.60*  --  0.65  AST 20  --   --   --   --   --   --   --   ALT 24  --   --   --   --   --   --   --   ALKPHOS 78  --   --   --   --   --   --   --   BILITOT 0.5  --   --   --   --   --   --   --   ALBUMIN 2.9*  --   --   --   --   --   --   --   CRP  --   --   --  8.4*  --  6.1*  --  6.3*  PROCALCITON  --   --   --   --   --  <0.10  --  <0.10  LATICACIDVEN 3.0* 2.9* 2.5*  --   --   --   --   --   INR 1.1  --   --   --   --   --   --   --   HGBA1C  --   --   --  8.8*   < >  --  8.6*  --   BNP  --   --   --   --   --  91.3  --  77.0  MG  --   --   --   --   --  1.6*  --  1.8  CALCIUM 9.2  --   --   --   --  8.7*  --  8.7*   < > = values in this interval not displayed.    Total Time in preparing  paper work, data evaluation and todays exam - 35 minutes  Signature  -    Susa Raring M.D on 02/25/2023 at 10:58 AM   -  To page go to [email or web address]

## 2023-02-25 NOTE — TOC Transition Note (Signed)
Transition of Care [facility]) - CM/SW Discharge Note   Patient Details  Name: Brent [name]. MRN: [id number] Date of Birth: [birth year]  [facility] CM/SW Contact:  Donnalee Curry, LCSWA Phone Number: 02/25/2023, 9:25 AM   Clinical Narrative:     SW spoke with [name] [phone]) confirmed able to accept today.   Bed: 401P Call Report:[phone]  SW updated pt's wife [name] [phone])  [facility] called  Final next level of care: Skilled Nursing Facility Barriers to Discharge: Barriers Resolved   Patient Goals and CMS Choice      Discharge Placement                  Patient to be transferred to facility by: [facility]   Patient and family notified of of transfer: 02/25/23  Discharge Plan and Services Additional resources added to the After Visit Summary for   In-house Referral: Clinical Social Work                                   Social Determinants of Health (SDOH) Interventions SDOH Screenings   Food Insecurity: No Food Insecurity (02/21/2023)  Housing: Low Risk  (02/21/2023)  Transportation Needs: No Transportation Needs (02/21/2023)  Utilities: Not At Risk (02/21/2023)  Depression (PHQ2-9): Low Risk  (12/09/2020)  Financial Resource Strain: Low Risk  (03/06/2020)  Physical Activity: Inactive (12/14/2018)  Social Connections: Unknown (12/14/2018)  Stress: No Stress Concern Present (12/14/2018)  Tobacco Use: Medium Risk (02/22/2023)     Readmission Risk Interventions    06/08/2020   11:45 AM  Readmission Risk Prevention Plan  Transportation Screening Complete  Medication Review (RN Care Manager) Referral to Pharmacy  PCP or Specialist appointment within 3-5 days of discharge Not Complete  PCP/Specialist Appt Not Complete comments SNF resident  [facility] or Home Care Consult Complete  SW Recovery Care/Counseling Consult Complete  Palliative Care Screening Complete  Skilled Nursing Facility Complete

## 2023-02-26 LAB — CULTURE, BLOOD (ROUTINE X 2)

## 2023-03-03 ENCOUNTER — Telehealth: Payer: Self-pay | Admitting: Gastroenterology

## 2023-03-03 NOTE — Telephone Encounter (Signed)
Notified Luisa Hart that IR would take care of the G tube leak.  I did provide this number to [facility].

## 2023-03-03 NOTE — Telephone Encounter (Signed)
Luisa Hart with [facility] called because the PT's G tube is leaking. Wants to confirm if we would fix this or interventional radiology. Please advise.

## 2023-03-06 ENCOUNTER — Other Ambulatory Visit (HOSPITAL_COMMUNITY): Payer: Self-pay | Admitting: Physician Assistant

## 2023-03-06 ENCOUNTER — Ambulatory Visit (HOSPITAL_COMMUNITY)
Admission: RE | Admit: 2023-03-06 | Discharge: 2023-03-06 | Disposition: A | Discharge status: Home / Self Care | Payer: Medicare Other | Source: Ambulatory Visit | Attending: Physician Assistant | Admitting: Physician Assistant

## 2023-03-06 DIAGNOSIS — K316 Fistula of stomach and duodenum: Secondary | ICD-10-CM

## 2023-03-06 DIAGNOSIS — E872 Acidosis, unspecified: Secondary | ICD-10-CM

## 2023-03-06 DIAGNOSIS — Z431 Encounter for attention to gastrostomy: Secondary | ICD-10-CM | POA: Diagnosis present

## 2023-03-06 HISTORY — PX: IR REPLC GASTRO/COLONIC TUBE PERCUT W/FLUORO: IMG2333

## 2023-03-06 MED ORDER — IOHEXOL 300 MG/ML  SOLN
50.0000 mL | Freq: Once | INTRAMUSCULAR | Status: AC | PRN
  Administered 2023-03-06: 10 mL

## 2023-03-07 ENCOUNTER — Emergency Department (HOSPITAL_COMMUNITY): Payer: Medicare Other

## 2023-03-07 ENCOUNTER — Encounter (HOSPITAL_COMMUNITY): Payer: Self-pay

## 2023-03-07 ENCOUNTER — Emergency Department (HOSPITAL_COMMUNITY)
Admission: EM | Admit: 2023-03-07 | Discharge: 2023-03-07 | Disposition: A | Discharge status: Skilled Nursing Facility | Payer: Medicare Other | Attending: Emergency Medicine | Admitting: Emergency Medicine

## 2023-03-07 ENCOUNTER — Other Ambulatory Visit: Payer: Self-pay

## 2023-03-07 DIAGNOSIS — Z794 Long term (current) use of insulin: Secondary | ICD-10-CM | POA: Insufficient documentation

## 2023-03-07 DIAGNOSIS — E119 Type 2 diabetes mellitus without complications: Secondary | ICD-10-CM | POA: Diagnosis not present

## 2023-03-07 DIAGNOSIS — Z7984 Long term (current) use of oral hypoglycemic drugs: Secondary | ICD-10-CM | POA: Insufficient documentation

## 2023-03-07 DIAGNOSIS — Z72820 Sleep deprivation: Secondary | ICD-10-CM

## 2023-03-07 DIAGNOSIS — L89159 Pressure ulcer of sacral region, unspecified stage: Secondary | ICD-10-CM | POA: Diagnosis not present

## 2023-03-07 DIAGNOSIS — R443 Hallucinations, unspecified: Secondary | ICD-10-CM | POA: Diagnosis present

## 2023-03-07 LAB — COMPREHENSIVE METABOLIC PANEL
ALT: 20 U/L (ref 0–44)
AST: 21 U/L (ref 15–41)
Albumin: 3.3 g/dL — ABNORMAL LOW (ref 3.5–5.0)
Alkaline Phosphatase: 117 U/L (ref 38–126)
Anion gap: 11 (ref 5–15)
BUN: 13 mg/dL (ref 8–23)
CO2: 24 mmol/L (ref 22–32)
Calcium: 9 mg/dL (ref 8.9–10.3)
Chloride: 97 mmol/L — ABNORMAL LOW (ref 98–111)
Creatinine, Ser: 0.71 mg/dL (ref 0.61–1.24)
GFR, Estimated: >60 mL/min (ref >60)
Glucose, Bld: 319 mg/dL — ABNORMAL HIGH (ref 70–99)
Potassium: 4.3 mmol/L (ref 3.5–5.1)
Sodium: 132 mmol/L — ABNORMAL LOW (ref 135–145)
Total Bilirubin: 0.2 mg/dL — ABNORMAL LOW (ref 0.3–1.2)
Total Protein: 6.6 g/dL (ref 6.5–8.1)

## 2023-03-07 LAB — CBC WITH DIFFERENTIAL/PLATELET
Abs Immature Granulocytes: 0.06 10*3/uL (ref 0.00–0.07)
Basophils Absolute: 0.1 10*3/uL (ref 0.0–0.1)
Basophils Relative: 1 %
Eosinophils Absolute: 0.2 10*3/uL (ref 0.0–0.5)
Eosinophils Relative: 3 %
HCT: 38.4 % — ABNORMAL LOW (ref 39.0–52.0)
Hemoglobin: 12.2 g/dL — ABNORMAL LOW (ref 13.0–17.0)
Immature Granulocytes: 1 %
Lymphocytes Relative: 15 %
Lymphs Abs: 1.1 10*3/uL (ref 0.7–4.0)
MCH: 27.7 pg (ref 26.0–34.0)
MCHC: 31.8 g/dL (ref 30.0–36.0)
MCV: 87.3 fL (ref 80.0–100.0)
Monocytes Absolute: 0.7 10*3/uL (ref 0.1–1.0)
Monocytes Relative: 9 %
Neutro Abs: 5.5 10*3/uL (ref 1.7–7.7)
Neutrophils Relative %: 71 %
Platelets: 257 10*3/uL (ref 150–400)
RBC: 4.4 MIL/uL (ref 4.22–5.81)
RDW: 15.8 % — ABNORMAL HIGH (ref 11.5–15.5)
WBC: 7.6 10*3/uL (ref 4.0–10.5)
nRBC: 0 % (ref 0.0–0.2)

## 2023-03-07 LAB — URINALYSIS, ROUTINE W REFLEX MICROSCOPIC
Bacteria, UA: NONE SEEN
Bilirubin Urine: NEGATIVE
Glucose, UA: >=500 mg/dL — AB
Hgb urine dipstick: NEGATIVE
Ketones, ur: NEGATIVE mg/dL
Nitrite: NEGATIVE
Protein, ur: 100 mg/dL — AB
Specific Gravity, Urine: 1.018 (ref 1.005–1.030)
pH: 6 (ref 5.0–8.0)

## 2023-03-07 MED ORDER — SODIUM CHLORIDE 0.9 % IV BOLUS
1000.0000 mL | Freq: Once | INTRAVENOUS | Status: AC
  Administered 2023-03-07: 250 mL via INTRAVENOUS

## 2023-03-07 NOTE — ED Triage Notes (Addendum)
Pt arrived from [facility] via [facility] c/o decrease in visual acuity. Pt states that he was recently hospitalized for sepsis and feels like he has not cleared recent infection. Pt states that his buttocks is sore 4/10 d/t bed sores

## 2023-03-07 NOTE — ED Notes (Signed)
[facility] Called no ETA

## 2023-03-07 NOTE — Discharge Instructions (Signed)
Follow-up with primary care doctor to discuss your vivid dreams and poor sleep

## 2023-03-07 NOTE — ED Provider Notes (Addendum)
[place] EMERGENCY DEPARTMENT AT [facility] Provider Note   CSN: [id number] Arrival date & time: 03/07/23  2039     History  Chief Complaint  Patient presents with   Hallucinations    Brent Goedert. is a 77 y.o. male.  Here mostly for some vivid dreams and difficulty with sleep and hallucinations at times.  He is also wants to make sure he does not have a urine infection.  Does not feel like his vision is always very good but not having any visual issues now.  He is a functional quadriplegic.  Recent hospital admission for UTI and encephalopathy.  For the most part he is felt well but his sleep has not been good.  He denies any chest pain or shortness of breath.  The history is provided by the patient.       Home Medications Prior to Admission medications   Medication Sig Start Date End Date Taking? Authorizing Provider  Alogliptin Benzoate 25 MG TABS Take 25 mg by mouth daily.    [provider]  Amino Acids-Protein Hydrolys (FEEDING SUPPLEMENT, PRO-STAT 64,) LIQD Take 30 mLs by mouth in the morning and at bedtime.    [provider]  atenolol (TENORMIN) 25 MG tablet Take 12.5-25 mg by mouth See admin instructions. Take 12.5 mg (1/2 tablet) by mouth once daily in the morning and 25 mg (1 tablet) at bedtime.    [provider]  atorvastatin (LIPITOR) 20 MG tablet Take 1 tablet (20 mg total) by mouth daily. Patient taking differently: Take 20 mg by mouth at bedtime. 03/26/20   Burnadette Pop, MD  B COMPLEX VITAMINS PO Take 1 capsule by mouth daily.    [provider]  baclofen (LIORESAL) 10 MG tablet Take 10 mg by mouth 3 (three) times daily.    [provider]  cholecalciferol (VITAMIN D3) 25 MCG (1000 UNIT) tablet Take 1,000 Units by mouth daily.    [provider]  ciprofloxacin (CIPRO) 500 MG tablet Take 1 tablet (500 mg total) by mouth 2 (two) times daily. 02/25/23   Leroy Sea, MD  DULoxetine  (CYMBALTA) 30 MG capsule Take 30 mg by mouth at bedtime.    [provider]  famotidine (PEPCID) 20 MG tablet Take 20 mg by mouth 2 (two) times daily.    [provider]  fexofenadine (ALLEGRA) 180 MG tablet Take 180 mg by mouth daily.    [provider]  gentamicin cream (GARAMYCIN) 0.1 % Apply 1 Application topically daily. To right 4th digit cuticle after washing with soap and water. 10/05/22   [provider]  insulin aspart (NOVOLOG) 100 UNIT/ML injection Inject 0-15 Units into the skin every 4 (four) hours. Sliding scale  CBG 70 - 120: 0 units: CBG 121 - 150: 2 units; CBG 151 - 200: 3 units; CBG 201 - 250: 5 units; CBG 251 - 300: 8 units;CBG 301 - 350: 11 units; CBG 351 - 400: 15 units; CBG > 400 : 15 units and notify MD Patient taking differently: Inject 26 Units into the skin See admin instructions. Inject 26 units into the skin before meals. Do not give if CBG <164ml/dl. 06/10/20   Rai, Ripudeep K, MD  insulin glargine (LANTUS) 100 unit/mL SOPN Inject 15 Units into the skin at bedtime. Patient taking differently: Inject 75 Units into the skin daily. 06/10/20   Rai, Delene Ruffini, MD  lamoTRIgine (LAMICTAL) 25 MG tablet Take 25 mg by mouth 3 (three)  times daily. For 5 days. Started 02/18/23.    [provider]  losartan (COZAAR) 50 MG tablet Take 75 mg by mouth daily.    [provider]  Magnesium Oxide, Elemental, 400 MG TABS Take 400 mg by mouth 2 (two) times daily.    [provider]  melatonin 3 MG TABS tablet Take 3 mg by mouth at bedtime.    [provider]  Menthol, Topical Analgesic, (BIOFREEZE) 4 % GEL Apply 1 application  topically 2 (two) times daily as needed (Pain to back upper and lower arms and shoulders).    [provider]  metFORMIN (GLUCOPHAGE) 500 MG tablet Take 500 mg by mouth 2 (two) times daily with a meal.    [provider]  nystatin powder Apply 1 Application topically daily. Apply to  peri-wound bed to sacrum and right ischium daily with routine dressing.    [provider]  olopatadine (PATANOL) 0.1 % ophthalmic solution Place 1 drop into both eyes 2 (two) times daily. 02/25/23   Leroy Sea, MD  Omega-3 Fatty Acids (FISH OIL) 1000 MG CAPS Take 1,000 mg by mouth daily.    [provider]  omeprazole (PRILOSEC) 40 MG capsule Take 1 capsule (40 mg total) by mouth 2 (two) times daily. 30 minutes before breakfast/dinner Patient taking differently: Take 40 mg by mouth 2 (two) times daily. 10/13/22   Mansouraty, Netty Starring., MD  ondansetron (ZOFRAN) 4 MG tablet Take 4 mg by mouth every 8 (eight) hours as needed for nausea.    [provider]  Polyethyl Glycol-Propyl Glycol (GENTEAL TEARS SEVERE DAY/NIGHT) 0.4-0.3 % GEL ophthalmic gel Place 1 Application into both eyes 4 (four) times daily as needed (Dry Eyes).    [provider]  PSYLLIUM PO Take 0.8 g by mouth in the morning and at bedtime.    [provider]  senna-docusate (SENOKOT-S) 8.6-50 MG tablet Take 1 tablet by mouth 2 (two) times daily.    [provider]  spironolactone (ALDACTONE) 25 MG tablet Take 25 mg by mouth daily.    [provider]  traMADol (ULTRAM) 50 MG tablet Take 50 mg by mouth daily as needed (Pain).    [provider]  traZODone (DESYREL) 50 MG tablet Take 75 mg by mouth at bedtime.    [provider]  vitamin C (ASCORBIC ACID) 500 MG tablet Take 500 mg by mouth daily.    [provider]      Allergies    Gabapentin, Levofloxacin, and Penicillins    Review of Systems   Review of Systems  Physical Exam Updated Vital Signs BP (!) 137/59   Pulse 63   Temp 98.1 F (36.7 C) (Oral)   Resp 16   SpO2 99%  Physical Exam Vitals and nursing note reviewed.  Constitutional:      General: He is not in acute distress.    Appearance: He is well-developed. He is not ill-appearing.  HENT:     Head: Normocephalic and  atraumatic.     Nose: Nose normal.     Mouth/Throat:     Mouth: Mucous membranes are moist.  Eyes:     Extraocular Movements: Extraocular movements intact.     Conjunctiva/sclera: Conjunctivae normal.     Pupils: Pupils are equal, round, and reactive to light.     Comments: 20/20 vision with glasses bilaterally, no visual field deficit  Cardiovascular:     Rate and Rhythm: Normal rate and regular rhythm.  Pulses: Normal pulses.     Heart sounds: Normal heart sounds. No murmur heard. Pulmonary:     Effort: Pulmonary effort is normal. No respiratory distress.     Breath sounds: Normal breath sounds.  Abdominal:     Palpations: Abdomen is soft.     Tenderness: There is no abdominal tenderness.  Musculoskeletal:        General: No swelling.     Cervical back: Normal range of motion and neck supple.  Skin:    General: Skin is warm and dry.     Capillary Refill: Capillary refill takes less than 2 seconds.     Comments: Sacral ulcer with clean wound margins, no purulence  Neurological:     Mental Status: He is alert. Mental status is at baseline.  Psychiatric:        Mood and Affect: Mood normal.     ED Results / Procedures / Treatments   Labs (all labs ordered are listed, but only abnormal results are displayed) Labs Reviewed  COMPREHENSIVE METABOLIC PANEL - Abnormal; Notable for the following components:      Result Value   Sodium 132 (*)    Chloride 97 (*)    Glucose, Bld 319 (*)    Albumin 3.3 (*)    Total Bilirubin 0.2 (*)    All other components within normal limits  CBC WITH DIFFERENTIAL/PLATELET - Abnormal; Notable for the following components:   Hemoglobin 12.2 (*)    HCT 38.4 (*)    RDW 15.8 (*)    All other components within normal limits  URINALYSIS, ROUTINE W REFLEX MICROSCOPIC - Abnormal; Notable for the following components:   Glucose, UA >=500 (*)    Protein, ur 100 (*)    Leukocytes,Ua TRACE (*)    All other components within normal limits     EKG None  Radiology CT HEAD WO CONTRAST ( )  Result Date: 03/07/2023 CLINICAL DATA:  Mental status change EXAM: CT HEAD WITHOUT CONTRAST TECHNIQUE: Contiguous axial images were obtained from the base of the skull through the vertex without intravenous contrast. RADIATION DOSE REDUCTION: This exam was performed according to the departmental dose-optimization program which includes automated exposure control, adjustment of the mA and/or kV according to patient size and/or use of iterative reconstruction technique. COMPARISON:  Head CT 02/21/2023 FINDINGS: Brain: No evidence of acute infarction, hemorrhage, hydrocephalus, extra-axial collection or mass lesion/mass effect. Again seen is moderate diffuse atrophy and mild periventricular white matter hypodensity, likely chronic small vessel ischemic change. There are old lacunar infarcts in the bilateral basal ganglia. Stable arachnoid cyst in the right posterior fossa. Vascular: Atherosclerotic calcifications are present within the cavernous internal carotid arteries. Skull: Normal. Negative for fracture or focal lesion. Sinuses/Orbits: Partial right mastoid effusion. Paranasal sinuses are clear. Orbits are within normal limits. Other: None. IMPRESSION: 1. No acute intracranial process. 2. Stable atrophy and chronic small vessel ischemic change. 3. Partial right mastoid effusion. Electronically Signed   By: Darliss Cheney M.D.   On: 03/07/2023 22:25   DG Chest 2 View  Result Date: 03/07/2023 CLINICAL DATA:  Cough EXAM: CHEST - 2 VIEW COMPARISON:  Chest x-ray 02/22/2023 FINDINGS: The heart is mildly enlarged. The lungs are clear. There is no pleural effusion or pneumothorax. No acute fractures are seen. IMPRESSION: Mild cardiomegaly. No acute pulmonary process. Electronically Signed   By: Darliss Cheney M.D.   On: 03/07/2023 21:30   IR Replc Gastro/Colonic Tube Percut W/Fluoro  Result Date: 03/06/2023 INDICATION: G-tube replacement into the  tract  Persistent gastrocutaneous fistula G-tube site. Recent attempt at percutaneous closure, unsuccessful. EXAM: REPLACEMENT OF GASTROSTOMY TUBE COMPARISON:  KUB, concurrent. IR fluoroscopy, 02/21/2023. CT AP, 12/08/2022. MEDICATIONS: None. CONTRAST:  10mL OMNIPAQUE IOHEXOL 300 MG/ML SOLN - administered into the gastric lumen FLUOROSCOPY TIME:  None COMPLICATIONS: None immediate. PROCEDURE: Informed written consent was obtained from the patient and/or patient's representative after a discussion of the risks, benefits and alternatives to treatment. Questions regarding the procedure were encouraged and answered. A timeout was performed prior to the initiation of the procedure. The upper abdomen and external portion of the existing gastrostomy tube was prepped and draped in the usual sterile fashion, and a sterile drape was applied covering the operative field. Maximum barrier sterile technique with sterile gowns and gloves were used for the procedure. A timeout was performed prior to the initiation of the procedure. The gastric cutaneous tract was cannulated with a 20 Fr balloon inflatable gastrostomy tube. The balloon was inflated with saline and dilute contrast and pulled against the anterior inner lumen of the stomach and the external disc was cinched. Contrast was injected and a post procedural spot fluoroscopic image was obtained confirming appropriate positioning of the new gastrostomy tube. A dressing was applied. The patient tolerated the procedure well without immediate postprocedural complication. IMPRESSION: Successful replacement of a new 20 Fr balloon-inflatable gastrostomy tube into the gastric cutaneous tract. The gastrostomy tube is ready for immediate use. PLAN: Unsuccessful attempt at percutaneous gastrocutaneous fistula closure and therefore tract occlusion with a gastrostomy tube. The patient may proceed to a surgical option for closure if desired. If the tube remains present, the patient will return to  Vascular Interventional Radiology (VIR) for routine tube exchange in 6-9 months. Roanna Banning, MD [facility] Radiology Electronically Signed   By: Roanna Banning M.D.   On: 03/06/2023 14:06   DG Abd 1 View  Result Date: 03/06/2023 CLINICAL DATA:  [id number] Malfunction of gastrostomy tube (HCC) [id number] EXAM: ABDOMEN - 1 VIEW COMPARISON:  IR fluoroscopy, 02/21/2023.  CT AP, 12/08/2022. FINDINGS: Support lines: Gastrostomy tube at the LEFT upper quadrant, with intraluminal contrast opacification of the stomach. Endoscopy clips at the LEFT upper quadrant and cholecystectomy clips of the RIGHT upper quadrant. The imaged bowel gas pattern is normal. Lung bases are relatively clear. No acute osseous abnormality. IMPRESSION: Gastrostomy tube, well-positioned with intraluminal contrast opacification of the stomach. Electronically Signed   By: Roanna Banning M.D.   On: 03/06/2023 14:00    Procedures Procedures    Medications Ordered in ED Medications  sodium chloride 0.9 % bolus 1,000 mL (0 mLs Intravenous Stopped 03/07/23 2308)    ED Course/ Medical Decision Making/ A&P                             Medical Decision Making Amount and/or Complexity of Data Reviewed Labs: ordered. Radiology: ordered.   [name]. is here with poor sleep, hallucination, vivid dreams.  Normal vitals.  No fever.  History of quadriplegia, diabetes.  Well-appearing today.  He has been having some bad sleep with some bad hallucinations.  He feels like his vision might be off.  He has 20/20 vision in both eyes with glasses.  No visual field deficits.  I have no concern for stroke or acute neurologic process.  He is concerned maybe that he has developed a urine infection again but has had no fever.  We checked a  CBC a CMP a urinalysis got a head CT and chest x-ray.  Per my review and interpretation there is no infectious process.  Urinalysis negative for infection.  No significant  anemia or electrolyte abnormality or kidney injury.  Head CT without any acute findings for radiology report.  Chest x-ray no evidence of pneumonia.  Overall I think his issues are mostly due to sleep issues.  Recommend he follow-up with primary care doctor for further recommendation treatment.  Discharged in good condition.  This chart was dictated using voice recognition software.  Despite best efforts to proofread,  errors can occur which can change the documentation meaning.         Final Clinical Impression(s) / ED Diagnoses Final diagnoses:  Hallucinations  Poor sleep    Rx / DC Orders ED Discharge Orders     None         Virgina Norfolk, DO 03/07/23 2234    Virgina Norfolk, DO 03/07/23 2323

## 2023-03-13 ENCOUNTER — Other Ambulatory Visit (HOSPITAL_COMMUNITY): Payer: Self-pay | Admitting: Interventional Radiology

## 2023-03-13 DIAGNOSIS — K316 Fistula of stomach and duodenum: Secondary | ICD-10-CM

## 2023-03-14 ENCOUNTER — Ambulatory Visit (HOSPITAL_COMMUNITY)
Admission: RE | Admit: 2023-03-14 | Discharge: 2023-03-14 | Disposition: A | Discharge status: Home / Self Care | Payer: Medicare Other | Source: Ambulatory Visit | Attending: Radiology | Admitting: Radiology

## 2023-03-14 ENCOUNTER — Ambulatory Visit (HOSPITAL_COMMUNITY)
Admission: RE | Admit: 2023-03-14 | Discharge: 2023-03-14 | Disposition: A | Discharge status: Home / Self Care | Payer: Medicare Other | Source: Ambulatory Visit | Attending: Interventional Radiology | Admitting: Interventional Radiology

## 2023-03-14 DIAGNOSIS — Z431 Encounter for attention to gastrostomy: Secondary | ICD-10-CM | POA: Insufficient documentation

## 2023-03-14 DIAGNOSIS — K316 Fistula of stomach and duodenum: Secondary | ICD-10-CM | POA: Insufficient documentation

## 2023-03-14 HISTORY — PX: IR REPLC GASTRO/COLONIC TUBE PERCUT W/FLUORO: IMG2333

## 2023-03-14 HISTORY — PX: IR REPLACE G-TUBE SIMPLE WO FLUORO: IMG2323

## 2023-03-14 MED ORDER — IOHEXOL 300 MG/ML  SOLN
50.0000 mL | Freq: Once | INTRAMUSCULAR | Status: AC | PRN
  Administered 2023-03-14: 20 mL

## 2023-03-15 ENCOUNTER — Other Ambulatory Visit (HOSPITAL_COMMUNITY): Payer: Self-pay | Admitting: Interventional Radiology

## 2023-03-15 ENCOUNTER — Encounter (HOSPITAL_COMMUNITY): Payer: Self-pay | Admitting: Radiology

## 2023-03-15 DIAGNOSIS — K316 Fistula of stomach and duodenum: Secondary | ICD-10-CM

## 2023-04-17 ENCOUNTER — Other Ambulatory Visit (HOSPITAL_COMMUNITY): Payer: Self-pay | Admitting: Interventional Radiology

## 2023-04-17 DIAGNOSIS — K316 Fistula of stomach and duodenum: Secondary | ICD-10-CM

## 2023-04-25 ENCOUNTER — Encounter: Payer: Self-pay | Admitting: Neurology

## 2023-04-25 ENCOUNTER — Telehealth: Payer: Medicare Other | Admitting: Neurology

## 2023-04-25 DIAGNOSIS — K592 Neurogenic bowel, not elsewhere classified: Secondary | ICD-10-CM | POA: Diagnosis not present

## 2023-04-25 DIAGNOSIS — G8250 Quadriplegia, unspecified: Secondary | ICD-10-CM

## 2023-04-25 DIAGNOSIS — Z79899 Other long term (current) drug therapy: Secondary | ICD-10-CM | POA: Diagnosis not present

## 2023-04-25 DIAGNOSIS — G360 Neuromyelitis optica [Devic]: Secondary | ICD-10-CM

## 2023-04-25 NOTE — Progress Notes (Signed)
Brent [name]. 1946-01-14  04/25/23  Virtual Visit via Video Note I connected with Brent [name].  on 04/25/23 at 11:00 AM EDT by a video enabled telemedicine application and verified that I am speaking with the correct person.  I discussed the limitations of evaluation and management by telemedicine and the availability of in person appointments. The patient expressed understanding and agreed to proceed.  Patient at [facility] (Skilled nursing)    Provider at office  History of Present Illness: Mr. Pitre continues on Rituxan for his at the [facility].  He tolerates the infusions well and there has not been any complication.    He denies any recent infections.  He had a sacral decubitus shortly after his diagnosis.Marland Kitchen  He does get occasional UTIs.  Are occurring less frequently since he has a suprapubic catheter.  IgG and IgM have been fine..  The NMO antibody was back in the normal range December 2022.   His next treatment is Novemebr 2024.     He was taking ciprofloxacin and had hallucinations, usually while drowsy.  He felt he had died and the hallucinations.  Once he stopped the medication (had UTI), he was back to baseline,     His neurologic status has been stable.  He has moderate to severe weakness in the hands and the mild to moderate left arm weakness and no strength in the legs.  He feels the left arm is slightly worse than it was last year.    He is reporting more dysesthesias and continues to note numbness/tingling in the left hand,.  This bothers him most at night..  This is mostly in the third and fourth fingers.  He has a neurogenic bladder and now has a suprapubic catheter.  He has no visual problems.   For the bowel, he has has an ileostomy.     He has not had optic neuritis and vision is stable.  PE: He is a well-developed well-nourished woman in no acute distress.  The head is normocephalic and atraumatic.  Sclera are anicteric.  Visible skin appears normal.   The neck has a good range of motion.    he is alert and fully oriented with fluent speech and good attention, knowledge and memory.  Extraocular muscles are intact.  Facial strength is normal.  He has normal proximal arm strength but has reduced ability to move fingers, extension worse than flexion.  [facility] HIstory: On March 08, 2020, while sitting at church, he started to have intense pain in his chest.  He was concerned about an MI and drove to his nearby house.   He continued to feel poorly and his wife called an ambulance.   He went to [facility] ED.and EKG was fine.  While in the ED, he started to experience numbness first in his right leg then left leg and then in the hands.     MRIs of the spine were initially felt to be non-contributary.   Initially a spinal stroke was suspected.   For one day, he had SOB and went to the ICU.   Anti-NMO Abs returned positive.   He received 5 day course of IV Solumedrol and 5 days IVIg.    Rituxan was infused 03/24/2020 and again 2 weeks later. He went to the Rehab floor and started to have some improvement.  Upon discharge, he went to skilled nursing facility.  Rituxan was continued    Past Medical History:  Diagnosis Date  Allergy    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 angioplasty of his RCA in 1990. b. bare metal stent placed in the RCA in 2000 followed by rotational atherectomy shortly after for stent restenosis. c. last cath was in 2012 showing stable moderate diffuse CAD. (70% mid LAD, 80% diagonal, 70% Ramus, 40% mid to distal RCA stent restenosis). d. Low risk nuc in 2015.   Diabetes mellitus    Diverticulosis    Elevated CK    Erectile dysfunction    Hemorrhoids    HTN (hypertension)    Hyperlipidemia    Hypertriglyceridemia    Malignant melanoma of left side of neck (HCC) 10/25/2018   Myocardial infarction (HCC)    Obesity    OSA (obstructive sleep apnea)    Persistent disorder of initiating or  maintaining sleep    Personal history of colonic polyps 02/05/2003   Renal lesion 06/20/2016     Past Surgical History:  Procedure Laterality Date   BIOPSY  03/26/2022   Procedure: BIOPSY;  Surgeon: Iva Boop, MD;  Location: [facility] ENDOSCOPY;  Service: Gastroenterology;;   CHOLECYSTECTOMY     CORONARY ANGIOPLASTY     CORONARY STENT PLACEMENT     stenting of the right coronary artery with followup rotational  atherectomy. (3 stents placed)   ENDOSCOPIC MUCOSAL RESECTION  10/13/2022   Procedure: ENDOSCOPIC MUCOSAL RESECTION;  Surgeon: Meridee Score Netty Starring., MD;  Location: [facility] ENDOSCOPY;  Service: Gastroenterology;;   ESOPHAGOGASTRODUODENOSCOPY (EGD) WITH PROPOFOL N/A 03/26/2022   Procedure: ESOPHAGOGASTRODUODENOSCOPY (EGD) WITH PROPOFOL;  Surgeon: Iva Boop, MD;  Location: [facility] ENDOSCOPY;  Service: Gastroenterology;  Laterality: N/A;   ESOPHAGOGASTRODUODENOSCOPY (EGD) WITH PROPOFOL N/A 07/11/2022   Procedure: ESOPHAGOGASTRODUODENOSCOPY (EGD) WITH PROPOFOL;  Surgeon: Meridee Score Netty Starring., MD;  Location: [facility] ENDOSCOPY;  Service: Gastroenterology;  Laterality: N/A;   ESOPHAGOGASTRODUODENOSCOPY (EGD) WITH PROPOFOL N/A 10/13/2022   Procedure: ESOPHAGOGASTRODUODENOSCOPY (EGD) WITH PROPOFOL;  Surgeon: Meridee Score Netty Starring., MD;  Location: [facility] ENDOSCOPY;  Service: Gastroenterology;  Laterality: N/A;   EUS N/A 10/13/2022   Procedure: UPPER ENDOSCOPIC ULTRASOUND (EUS) RADIAL;  Surgeon: Lemar Lofty., MD;  Location: [facility] ENDOSCOPY;  Service: Gastroenterology;  Laterality: N/A;   FINGER SURGERY     right   FOOT SURGERY     right   HEMOSTASIS CLIP PLACEMENT  07/11/2022   Procedure: HEMOSTASIS CLIP PLACEMENT;  Surgeon: Meridee Score Netty Starring., MD;  Location: [facility] ENDOSCOPY;  Service: Gastroenterology;;  ovesco    HEMOSTASIS CLIP PLACEMENT  10/13/2022   Procedure: HEMOSTASIS CLIP PLACEMENT;  Surgeon: Lemar Lofty., MD;  Location: [facility] ENDOSCOPY;  Service: Gastroenterology;;   HOT  HEMOSTASIS N/A 07/11/2022   Procedure: HOT HEMOSTASIS (ARGON PLASMA COAGULATION/BICAP);  Surgeon: Lemar Lofty., MD;  Location: [facility] ENDOSCOPY;  Service: Gastroenterology;  Laterality: N/A;   HOT HEMOSTASIS N/A 10/13/2022   Procedure: HOT HEMOSTASIS (ARGON PLASMA COAGULATION/BICAP);  Surgeon: Lemar Lofty., MD;  Location: [facility] ENDOSCOPY;  Service: Gastroenterology;  Laterality: N/A;   INGUINAL HERNIA REPAIR     right   IR FLUORO PROCEDURE UNLISTED  02/21/2023   IR GASTROSTOMY TUBE REMOVAL  02/10/2022   IR REPLACE G-TUBE SIMPLE WO FLUORO  03/14/2023   IR REPLC GASTRO/COLONIC TUBE PERCUT W/FLUORO  12/22/2022   IR REPLC GASTRO/COLONIC TUBE PERCUT W/FLUORO  01/26/2023   IR REPLC GASTRO/COLONIC TUBE PERCUT W/FLUORO  03/06/2023   IRRIGATION AND DEBRIDEMENT ABSCESS N/A 06/04/2020   Procedure: IRRIGATION AND DEBRIDEMENT SACRAL WOUND;  Surgeon: Diamantina Monks,  MD;  Location: [facility] OR;  Service: General;  Laterality: N/A;   LAPAROSCOPY N/A 06/04/2020   Procedure: LAPAROSCOPY ASSISTED COLOSTOMY, LAPAROSCOPY GASTROSTOMY;  Surgeon: Diamantina Monks, MD;  Location: [facility] OR;  Service: General;  Laterality: N/A;   LYSIS OF ADHESION N/A 06/04/2020   Procedure: LYSIS OF ADHESION;  Surgeon: Diamantina Monks, MD;  Location: [facility] OR;  Service: General;  Laterality: N/A;   melanoma removal     neck   ORTHOPEDIC SURGERY     foot right   POLYPECTOMY  10/13/2022   Procedure: POLYPECTOMY;  Surgeon: Mansouraty, Netty Starring., MD;  Location: [facility] ENDOSCOPY;  Service: Gastroenterology;;   rotator cuff surg     Bil   STENT REMOVAL  10/13/2022   Procedure: CLIP REMOVAL;  Surgeon: Lemar Lofty., MD;  Location: [facility] ENDOSCOPY;  Service: Gastroenterology;;   SUBMUCOSAL LIFTING INJECTION  10/13/2022   Procedure: SUBMUCOSAL LIFTING INJECTION;  Surgeon: Lemar Lofty., MD;  Location: [facility] ENDOSCOPY;  Service: Gastroenterology;;   SUBMUCOSAL TATTOO INJECTION  10/13/2022   Procedure: SUBMUCOSAL TATTOO INJECTION;   Surgeon: Lemar Lofty., MD;  Location: [facility] ENDOSCOPY;  Service: Gastroenterology;;   Family History  Problem Relation Age of Onset   Lung cancer Mother    COPD Mother    Obesity Mother    Liver cancer Father    Lung cancer Father    Heart disease Father    Heart disease Sister    Colon cancer Neg Hx    Stomach cancer Neg Hx    Pancreatic cancer Neg Hx    Esophageal cancer Neg Hx    Inflammatory bowel disease Neg Hx    Liver disease Neg Hx    Rectal cancer Neg Hx      PHYSICAL from 02/07/2023 There were no vitals filed for this visit.    General: The patient is well-developed and well-nourished and in no acute distress   HEENT:  Head is [place]/AT.  Sclera are anicteric.  Symmetric color vision    Skin: Extremities show mild edema at the ankles.  No rashes.   Neurologic Exam   Mental status: The patient is alert and oriented x 3 at the time of the examination. The patient has apparent normal recent and remote memory, with an apparently normal attention span and concentration ability.   Speech is normal.   Cranial nerves: Extraocular movements are full. Pupils are equal, round, and reactive to light and accomodation.   Color vision was symmetric.  Facial strength and sensation was normal.   No dysarthria is noted.  No obvious hearing deficits are noted.   Motor:  Muscle bulk is normal.   In the right arm, strength was 5/5 in the deltoid and biceps, 4/5 in the triceps, 3/5 in finger flexors and extensors and 1 in the intrinsic hand muscles.  In the left arm, strength was 4+/5 in the biceps, 3/5 in the triceps, 2/5 in finger flexors and 0-1 in finger extensors and intrinsic hand muscles.  Strength was 0/5 in both legs.   Sensory: In the legs, he has reduced sensation to temperature but more normal sensation to touch.  He has fairly normal sensation to vibration.   Coordination: Cerebellar testing reveals mildly reduced finger-nose-finger.  He could not do heel-to-shin.    Gait and station: He is unable to stand   Reflexes: Deep tendon reflexes are 3+ at the knees (crossed adductors).  No ankle clonus.      Assessment and Plan: 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HCC)  High risk medication use  Quadriplegia (HCC)  Neurogenic bowel   He has not had any further NMO exacerbation since starting Rituxan.    We will continue with rituximab 1000 mg x 2 separated by 2 weeks every 6 months labs have been stable.  Fax lab request (for IgG/M/A) to  [facility]  [phone] Hallucinations appear to be due to either to the urinary tract infection or the ciprofloxacin treatment.  I will add adverse reaction to Cipro in allergy list return in 6 months or sooner if there are new or worsening neurologic symptoms.  This visit is part of a comprehensive longitudinal care medical relationship regarding the patients primary diagnosis of neuromyelitis optica and related concerns.    Follow Up Instructions: I discussed the assessment and treatment plan with the patient. The patient was provided an opportunity to ask questions and all were answered. The patient agreed with the plan and demonstrated an understanding of the instructions.    The patient was advised to call back or seek an in-person evaluation if the symptoms worsen or if the condition fails to improve as anticipated.  I provided 18 minutes of non-face-to-face time during this encounter.    A. Epimenio Foot, MD, PhD, FAAN Certified in Neurology, Clinical Neurophysiology, Sleep Medicine, Pain Medicine and Neuroimaging Director, [facility]  [facility] [address] [place], [place] [address] [phone]

## 2023-04-26 ENCOUNTER — Telehealth: Payer: Self-pay | Admitting: *Deleted

## 2023-04-26 NOTE — Telephone Encounter (Signed)
Order faxed to [facility] [phone] confirmation received.

## 2023-04-27 ENCOUNTER — Ambulatory Visit (HOSPITAL_COMMUNITY)
Admission: RE | Admit: 2023-04-27 | Discharge: 2023-04-27 | Disposition: A | Discharge status: Home / Self Care | Payer: Medicare Other | Source: Ambulatory Visit | Attending: Interventional Radiology | Admitting: Interventional Radiology

## 2023-04-27 ENCOUNTER — Other Ambulatory Visit (HOSPITAL_COMMUNITY): Payer: Self-pay | Admitting: Interventional Radiology

## 2023-04-27 DIAGNOSIS — K316 Fistula of stomach and duodenum: Secondary | ICD-10-CM | POA: Insufficient documentation

## 2023-04-27 DIAGNOSIS — Z431 Encounter for attention to gastrostomy: Secondary | ICD-10-CM | POA: Insufficient documentation

## 2023-04-27 HISTORY — PX: IR REPLC GASTRO/COLONIC TUBE PERCUT W/FLUORO: IMG2333

## 2023-04-27 MED ORDER — LIDOCAINE VISCOUS HCL 2 % MT SOLN
OROMUCOSAL | Status: AC
  Filled 2023-04-27: qty 15

## 2023-04-27 MED ORDER — IOHEXOL 300 MG/ML  SOLN
50.0000 mL | Freq: Once | INTRAMUSCULAR | Status: AC | PRN
  Administered 2023-04-27: 10 mL

## 2023-05-08 ENCOUNTER — Ambulatory Visit: Payer: Medicare Other | Admitting: Neurology

## 2023-05-21 ENCOUNTER — Inpatient Hospital Stay (HOSPITAL_COMMUNITY)
Admission: EM | Admit: 2023-05-21 | Discharge: 2023-05-31 | DRG: 326 | Disposition: A | Discharge status: Skilled Nursing Facility | Payer: Medicare Other | Source: Skilled Nursing Facility | Attending: Family Medicine | Admitting: Family Medicine

## 2023-05-21 ENCOUNTER — Other Ambulatory Visit: Payer: Self-pay

## 2023-05-21 ENCOUNTER — Encounter (HOSPITAL_COMMUNITY): Payer: Self-pay | Admitting: Internal Medicine

## 2023-05-21 ENCOUNTER — Emergency Department (HOSPITAL_COMMUNITY): Payer: Medicare Other

## 2023-05-21 DIAGNOSIS — Z792 Long term (current) use of antibiotics: Secondary | ICD-10-CM

## 2023-05-21 DIAGNOSIS — E781 Pure hyperglyceridemia: Secondary | ICD-10-CM | POA: Diagnosis present

## 2023-05-21 DIAGNOSIS — F32A Depression, unspecified: Secondary | ICD-10-CM | POA: Diagnosis present

## 2023-05-21 DIAGNOSIS — Z9049 Acquired absence of other specified parts of digestive tract: Secondary | ICD-10-CM

## 2023-05-21 DIAGNOSIS — R532 Functional quadriplegia: Secondary | ICD-10-CM | POA: Diagnosis present

## 2023-05-21 DIAGNOSIS — I2510 Atherosclerotic heart disease of native coronary artery without angina pectoris: Secondary | ICD-10-CM | POA: Diagnosis present

## 2023-05-21 DIAGNOSIS — Z8249 Family history of ischemic heart disease and other diseases of the circulatory system: Secondary | ICD-10-CM

## 2023-05-21 DIAGNOSIS — Z955 Presence of coronary angioplasty implant and graft: Secondary | ICD-10-CM | POA: Diagnosis not present

## 2023-05-21 DIAGNOSIS — N319 Neuromuscular dysfunction of bladder, unspecified: Secondary | ICD-10-CM | POA: Diagnosis present

## 2023-05-21 DIAGNOSIS — Z800 Family history of malignant neoplasm of digestive organs: Secondary | ICD-10-CM

## 2023-05-21 DIAGNOSIS — G360 Neuromyelitis optica [Devic]: Secondary | ICD-10-CM | POA: Diagnosis present

## 2023-05-21 DIAGNOSIS — Z881 Allergy status to other antibiotic agents status: Secondary | ICD-10-CM

## 2023-05-21 DIAGNOSIS — Z931 Gastrostomy status: Secondary | ICD-10-CM | POA: Diagnosis not present

## 2023-05-21 DIAGNOSIS — Z85820 Personal history of malignant melanoma of skin: Secondary | ICD-10-CM | POA: Diagnosis not present

## 2023-05-21 DIAGNOSIS — K660 Peritoneal adhesions (postprocedural) (postinfection): Secondary | ICD-10-CM | POA: Diagnosis present

## 2023-05-21 DIAGNOSIS — K9420 Gastrostomy complication, unspecified: Secondary | ICD-10-CM

## 2023-05-21 DIAGNOSIS — Z801 Family history of malignant neoplasm of trachea, bronchus and lung: Secondary | ICD-10-CM

## 2023-05-21 DIAGNOSIS — K316 Fistula of stomach and duodenum: Secondary | ICD-10-CM | POA: Diagnosis present

## 2023-05-21 DIAGNOSIS — E1165 Type 2 diabetes mellitus with hyperglycemia: Secondary | ICD-10-CM | POA: Diagnosis present

## 2023-05-21 DIAGNOSIS — Z933 Colostomy status: Secondary | ICD-10-CM | POA: Diagnosis not present

## 2023-05-21 DIAGNOSIS — I10 Essential (primary) hypertension: Secondary | ICD-10-CM | POA: Diagnosis present

## 2023-05-21 DIAGNOSIS — D649 Anemia, unspecified: Secondary | ICD-10-CM | POA: Diagnosis present

## 2023-05-21 DIAGNOSIS — Z87891 Personal history of nicotine dependence: Secondary | ICD-10-CM | POA: Diagnosis not present

## 2023-05-21 DIAGNOSIS — E785 Hyperlipidemia, unspecified: Secondary | ICD-10-CM | POA: Diagnosis not present

## 2023-05-21 DIAGNOSIS — R21 Rash and other nonspecific skin eruption: Secondary | ICD-10-CM | POA: Diagnosis present

## 2023-05-21 DIAGNOSIS — Z960 Presence of urogenital implants: Secondary | ICD-10-CM | POA: Diagnosis present

## 2023-05-21 DIAGNOSIS — I252 Old myocardial infarction: Secondary | ICD-10-CM | POA: Diagnosis not present

## 2023-05-21 DIAGNOSIS — T8149XA Infection following a procedure, other surgical site, initial encounter: Secondary | ICD-10-CM | POA: Diagnosis present

## 2023-05-21 DIAGNOSIS — L89154 Pressure ulcer of sacral region, stage 4: Secondary | ICD-10-CM | POA: Diagnosis present

## 2023-05-21 DIAGNOSIS — L02211 Cutaneous abscess of abdominal wall: Secondary | ICD-10-CM | POA: Diagnosis present

## 2023-05-21 DIAGNOSIS — L03311 Cellulitis of abdominal wall: Secondary | ICD-10-CM | POA: Diagnosis present

## 2023-05-21 DIAGNOSIS — Z86010 Personal history of colonic polyps: Secondary | ICD-10-CM | POA: Diagnosis not present

## 2023-05-21 DIAGNOSIS — Z825 Family history of asthma and other chronic lower respiratory diseases: Secondary | ICD-10-CM

## 2023-05-21 DIAGNOSIS — G373 Acute transverse myelitis in demyelinating disease of central nervous system: Secondary | ICD-10-CM | POA: Diagnosis present

## 2023-05-21 DIAGNOSIS — K9429 Other complications of gastrostomy: Secondary | ICD-10-CM | POA: Diagnosis present

## 2023-05-21 DIAGNOSIS — Z79899 Other long term (current) drug therapy: Secondary | ICD-10-CM

## 2023-05-21 DIAGNOSIS — Z794 Long term (current) use of insulin: Secondary | ICD-10-CM

## 2023-05-21 DIAGNOSIS — G9341 Metabolic encephalopathy: Principal | ICD-10-CM

## 2023-05-21 DIAGNOSIS — Z880 Allergy status to penicillin: Secondary | ICD-10-CM

## 2023-05-21 DIAGNOSIS — Z7984 Long term (current) use of oral hypoglycemic drugs: Secondary | ICD-10-CM

## 2023-05-21 DIAGNOSIS — K632 Fistula of intestine: Secondary | ICD-10-CM

## 2023-05-21 DIAGNOSIS — N289 Disorder of kidney and ureter, unspecified: Secondary | ICD-10-CM | POA: Diagnosis present

## 2023-05-21 DIAGNOSIS — Z7401 Bed confinement status: Secondary | ICD-10-CM

## 2023-05-21 DIAGNOSIS — G4733 Obstructive sleep apnea (adult) (pediatric): Secondary | ICD-10-CM | POA: Diagnosis present

## 2023-05-21 LAB — COMPREHENSIVE METABOLIC PANEL
ALT: 28 U/L (ref 0–44)
AST: 33 U/L (ref 15–41)
Albumin: 3 g/dL — ABNORMAL LOW (ref 3.5–5.0)
Alkaline Phosphatase: 72 U/L (ref 38–126)
Anion gap: 11 (ref 5–15)
BUN: 11 mg/dL (ref 8–23)
CO2: 23 mmol/L (ref 22–32)
Calcium: 8.8 mg/dL — ABNORMAL LOW (ref 8.9–10.3)
Chloride: 105 mmol/L (ref 98–111)
Creatinine, Ser: 0.59 mg/dL — ABNORMAL LOW (ref 0.61–1.24)
GFR, Estimated: >60 mL/min (ref >60)
Glucose, Bld: 91 mg/dL (ref 70–99)
Potassium: 3.8 mmol/L (ref 3.5–5.1)
Sodium: 139 mmol/L (ref 135–145)
Total Bilirubin: 0.4 mg/dL (ref 0.3–1.2)
Total Protein: 6 g/dL — ABNORMAL LOW (ref 6.5–8.1)

## 2023-05-21 LAB — CBC WITH DIFFERENTIAL/PLATELET
Abs Immature Granulocytes: 0.03 10*3/uL (ref 0.00–0.07)
Basophils Absolute: 0 10*3/uL (ref 0.0–0.1)
Basophils Relative: 0 %
Eosinophils Absolute: 0.4 10*3/uL (ref 0.0–0.5)
Eosinophils Relative: 5 %
HCT: 37.3 % — ABNORMAL LOW (ref 39.0–52.0)
Hemoglobin: 11.4 g/dL — ABNORMAL LOW (ref 13.0–17.0)
Immature Granulocytes: 0 %
Lymphocytes Relative: 21 %
Lymphs Abs: 1.6 10*3/uL (ref 0.7–4.0)
MCH: 27 pg (ref 26.0–34.0)
MCHC: 30.6 g/dL (ref 30.0–36.0)
MCV: 88.2 fL (ref 80.0–100.0)
Monocytes Absolute: 0.8 10*3/uL (ref 0.1–1.0)
Monocytes Relative: 10 %
Neutro Abs: 4.9 10*3/uL (ref 1.7–7.7)
Neutrophils Relative %: 64 %
Platelets: 293 10*3/uL (ref 150–400)
RBC: 4.23 MIL/uL (ref 4.22–5.81)
RDW: 15.9 % — ABNORMAL HIGH (ref 11.5–15.5)
WBC: 7.9 10*3/uL (ref 4.0–10.5)
nRBC: 0 % (ref 0.0–0.2)

## 2023-05-21 LAB — LACTIC ACID, PLASMA
Lactic Acid, Venous: 1.2 mmol/L (ref 0.5–1.9)
Lactic Acid, Venous: 1.5 mmol/L (ref 0.5–1.9)

## 2023-05-21 LAB — GLUCOSE, CAPILLARY
Glucose-Capillary: 118 mg/dL — ABNORMAL HIGH (ref 70–99)
Glucose-Capillary: 158 mg/dL — ABNORMAL HIGH (ref 70–99)

## 2023-05-21 LAB — LIPASE, BLOOD: Lipase: 20 U/L (ref 11–51)

## 2023-05-21 MED ORDER — POLYETHYL GLYCOL-PROPYL GLYCOL 0.4-0.3 % OP GEL: 1.0000 | Freq: Four times a day (QID) | OPHTHALMIC | Status: DC | PRN

## 2023-05-21 MED ORDER — ACETAMINOPHEN 325 MG PO TABS: 650.0000 mg | ORAL_TABLET | Freq: Four times a day (QID) | ORAL | Status: DC | PRN

## 2023-05-21 MED ORDER — PANTOPRAZOLE SODIUM 40 MG PO TBEC
40.0000 mg | DELAYED_RELEASE_TABLET | Freq: Every day | ORAL | Status: DC
  Administered 2023-05-22 – 2023-05-31 (×9): 40 mg via ORAL
  Filled 2023-05-21 (×9): qty 1

## 2023-05-21 MED ORDER — DEXTROSE-SODIUM CHLORIDE 5-0.9 % IV SOLN: INTRAVENOUS | Status: AC

## 2023-05-21 MED ORDER — IOHEXOL 9 MG/ML PO SOLN
500.0000 mL | ORAL | Status: AC
  Administered 2023-05-21: 500 mL via ORAL

## 2023-05-21 MED ORDER — BACLOFEN 10 MG PO TABS
20.0000 mg | ORAL_TABLET | Freq: Three times a day (TID) | ORAL | Status: DC
  Administered 2023-05-21 – 2023-05-31 (×28): 20 mg via ORAL
  Filled 2023-05-21 (×29): qty 2

## 2023-05-21 MED ORDER — TRAMADOL HCL 50 MG PO TABS
50.0000 mg | ORAL_TABLET | Freq: Every day | ORAL | Status: DC | PRN
  Administered 2023-05-22 – 2023-05-24 (×3): 50 mg via ORAL
  Filled 2023-05-21 (×3): qty 1

## 2023-05-21 MED ORDER — ONDANSETRON HCL 4 MG/2ML IJ SOLN
4.0000 mg | Freq: Four times a day (QID) | INTRAMUSCULAR | Status: DC | PRN
  Administered 2023-05-24: 4 mg via INTRAVENOUS
  Filled 2023-05-21: qty 2

## 2023-05-21 MED ORDER — INSULIN ASPART 100 UNIT/ML IJ SOLN
0.0000 [IU] | Freq: Four times a day (QID) | INTRAMUSCULAR | Status: AC
  Administered 2023-05-22 (×3): 2 [IU] via SUBCUTANEOUS

## 2023-05-21 MED ORDER — ACETAMINOPHEN 650 MG RE SUPP: 650.0000 mg | Freq: Four times a day (QID) | RECTAL | Status: DC | PRN

## 2023-05-21 MED ORDER — ENOXAPARIN SODIUM 40 MG/0.4ML IJ SOSY
40.0000 mg | PREFILLED_SYRINGE | INTRAMUSCULAR | Status: DC
  Administered 2023-05-21 – 2023-05-31 (×11): 40 mg via SUBCUTANEOUS
  Filled 2023-05-21 (×11): qty 0.4

## 2023-05-21 MED ORDER — LOSARTAN POTASSIUM 50 MG PO TABS
75.0000 mg | ORAL_TABLET | Freq: Every day | ORAL | Status: DC
  Administered 2023-05-22 – 2023-05-31 (×9): 75 mg via ORAL
  Filled 2023-05-21 (×9): qty 2

## 2023-05-21 MED ORDER — ONDANSETRON HCL 4 MG PO TABS: 4.0000 mg | ORAL_TABLET | Freq: Four times a day (QID) | ORAL | Status: DC | PRN

## 2023-05-21 MED ORDER — ATORVASTATIN CALCIUM 40 MG PO TABS
40.0000 mg | ORAL_TABLET | Freq: Every day | ORAL | Status: DC
  Administered 2023-05-21 – 2023-05-31 (×11): 40 mg via ORAL
  Filled 2023-05-21 (×11): qty 1

## 2023-05-21 MED ORDER — HYDRALAZINE HCL 20 MG/ML IJ SOLN
5.0000 mg | INTRAMUSCULAR | Status: DC | PRN
  Administered 2023-05-23 – 2023-05-24 (×3): 5 mg via INTRAVENOUS
  Filled 2023-05-21 (×3): qty 1

## 2023-05-21 MED ORDER — DULOXETINE HCL 30 MG PO CPEP
30.0000 mg | ORAL_CAPSULE | Freq: Every day | ORAL | Status: DC
  Administered 2023-05-21 – 2023-05-31 (×11): 30 mg via ORAL
  Filled 2023-05-21 (×11): qty 1

## 2023-05-21 MED ORDER — TRAZODONE HCL 50 MG PO TABS
75.0000 mg | ORAL_TABLET | Freq: Every day | ORAL | Status: DC
  Administered 2023-05-21 – 2023-05-31 (×11): 75 mg via ORAL
  Filled 2023-05-21 (×11): qty 2

## 2023-05-21 MED ORDER — IOHEXOL 350 MG/ML SOLN
75.0000 mL | Freq: Once | INTRAVENOUS | Status: AC | PRN
  Administered 2023-05-21: 75 mL via INTRAVENOUS

## 2023-05-21 MED ORDER — LORAZEPAM 2 MG/ML IJ SOLN
1.0000 mg | Freq: Once | INTRAMUSCULAR | Status: AC | PRN
  Administered 2023-05-27: 1 mg via INTRAVENOUS
  Filled 2023-05-21: qty 1

## 2023-05-21 MED ORDER — POLYVINYL ALCOHOL 1.4 % OP SOLN
1.0000 [drp] | Freq: Four times a day (QID) | OPHTHALMIC | Status: DC | PRN
  Administered 2023-05-21 – 2023-05-22 (×2): 1 [drp] via OPHTHALMIC
  Filled 2023-05-21: qty 15

## 2023-05-21 MED ORDER — FAMOTIDINE 20 MG PO TABS
20.0000 mg | ORAL_TABLET | Freq: Two times a day (BID) | ORAL | Status: DC
  Administered 2023-05-21 – 2023-05-31 (×20): 20 mg via ORAL
  Filled 2023-05-21 (×20): qty 1

## 2023-05-21 MED ORDER — INSULIN DETEMIR 100 UNIT/ML ~~LOC~~ SOLN
5.0000 [IU] | Freq: Every day | SUBCUTANEOUS | Status: DC
  Administered 2023-05-21: 5 [IU] via SUBCUTANEOUS
  Filled 2023-05-21 (×2): qty 0.05

## 2023-05-21 NOTE — Consult Note (Signed)
Consulting Physician: Hyman Hopes Champayne Kocian  Referring Provider: Dr. Rush Landmark  Chief Complaint: Gastrocutaneous fistula  Reason for Consult: Gastrocutaneous fistula   Subjective   HPI: Brent Laber. is an 77 y.o. male who is here for gastrocutaneous fistula.  Brent [name] has functional quadraplegia due to a neurodegenerative disorder and takes Rituxan which is thought to be contributing to failure of his previous gastrostomy tube site to heal.  The gastrostomy tube was originally placed 06/04/2020.  This was near the time of his original neurologic diagnosis.  A loop sigmoid colectomy was performed at the same time.  He also has a suprapubic catheter.  Since 2021, he is now able to eat and not in need of the gastrostomy tube.  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  Attempts at closure of this gastrocutaneous fistula have included: Percutaneous ECF closure with Chancy Milroy Plug 02/21/23 Endoscopic OVESCO GC Clip 10/13/22 Endoscopic OVESCO GC Clip 07/11/22  Since the plug failure, the patient has had gastrostomy tubes placed in IR in attempt to plug the fistula.  New tubes were placed on 03/14/23, and 04/27/23, but they keep falling out.  Most recently on Thursday his tube fell out, and he has had a large amount of drainage ever since which brought him into the emergency department today.    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 angioplasty of his RCA in 1990. b. bare metal stent placed in the RCA in 2000 followed by rotational atherectomy shortly after for stent restenosis. c. last cath was in 2012 showing stable moderate diffuse CAD. (70% mid LAD, 80% diagonal, 70% Ramus, 40% mid to distal RCA stent restenosis). d. Low risk nuc in 2015.   Diabetes mellitus    Diverticulosis    Elevated CK    Erectile dysfunction     Hemorrhoids    HTN (hypertension)    Hyperlipidemia    Hypertriglyceridemia    Malignant melanoma of left side of neck (HCC) 10/25/2018   Myocardial infarction (HCC)    Obesity    OSA (obstructive sleep apnea)    Persistent disorder of initiating or maintaining sleep    Personal history of colonic polyps 02/05/2003   Renal lesion 06/20/2016    Past Surgical History:  Procedure Laterality Date   BIOPSY  03/26/2022   Procedure: BIOPSY;  Surgeon: Iva Boop, MD;  Location: [facility] ENDOSCOPY;  Service: Gastroenterology;;   CHOLECYSTECTOMY     CORONARY ANGIOPLASTY     CORONARY STENT PLACEMENT     stenting of the right coronary artery with followup rotational  atherectomy. (3 stents placed)   ENDOSCOPIC MUCOSAL RESECTION  10/13/2022   Procedure: ENDOSCOPIC MUCOSAL RESECTION;  Surgeon: Meridee Score Netty Starring., MD;  Location: [facility] ENDOSCOPY;  Service: Gastroenterology;;   ESOPHAGOGASTRODUODENOSCOPY (EGD) WITH PROPOFOL N/A 03/26/2022   Procedure: ESOPHAGOGASTRODUODENOSCOPY (EGD) WITH PROPOFOL;  Surgeon: Iva Boop, MD;  Location: [facility] ENDOSCOPY;  Service: Gastroenterology;  Laterality: N/A;   ESOPHAGOGASTRODUODENOSCOPY (EGD) WITH PROPOFOL N/A 07/11/2022   Procedure: ESOPHAGOGASTRODUODENOSCOPY (EGD) WITH PROPOFOL;  Surgeon: Meridee Score Netty Starring., MD;  Location: [facility] ENDOSCOPY;  Service: Gastroenterology;  Laterality: N/A;   ESOPHAGOGASTRODUODENOSCOPY (EGD) WITH PROPOFOL N/A 10/13/2022   Procedure: ESOPHAGOGASTRODUODENOSCOPY (EGD) WITH PROPOFOL;  Surgeon: Meridee Score Netty Starring., MD;  Location: [facility] ENDOSCOPY;  Service: Gastroenterology;  Laterality: N/A;   EUS N/A 10/13/2022  Procedure: UPPER ENDOSCOPIC ULTRASOUND (EUS) RADIAL;  Surgeon: Meridee Score Netty Starring., MD;  Location: [facility] ENDOSCOPY;  Service: Gastroenterology;  Laterality: N/A;   FINGER SURGERY     right   FOOT SURGERY     right   HEMOSTASIS CLIP PLACEMENT  07/11/2022   Procedure: HEMOSTASIS CLIP PLACEMENT;  Surgeon: Meridee Score Netty Starring., MD;  Location: [facility] ENDOSCOPY;  Service: Gastroenterology;;  ovesco    HEMOSTASIS CLIP PLACEMENT  10/13/2022   Procedure: HEMOSTASIS CLIP PLACEMENT;  Surgeon: Lemar Lofty., MD;  Location: [facility] ENDOSCOPY;  Service: Gastroenterology;;   HOT HEMOSTASIS N/A 07/11/2022   Procedure: HOT HEMOSTASIS (ARGON PLASMA COAGULATION/BICAP);  Surgeon: Lemar Lofty., MD;  Location: [facility] ENDOSCOPY;  Service: Gastroenterology;  Laterality: N/A;   HOT HEMOSTASIS N/A 10/13/2022   Procedure: HOT HEMOSTASIS (ARGON PLASMA COAGULATION/BICAP);  Surgeon: Lemar Lofty., MD;  Location: [facility] ENDOSCOPY;  Service: Gastroenterology;  Laterality: N/A;   INGUINAL HERNIA REPAIR     right   IR FLUORO PROCEDURE UNLISTED  02/21/2023   IR GASTROSTOMY TUBE REMOVAL  02/10/2022   IR REPLACE G-TUBE SIMPLE WO FLUORO  03/14/2023   IR REPLC GASTRO/COLONIC TUBE PERCUT W/FLUORO  12/22/2022   IR REPLC GASTRO/COLONIC TUBE PERCUT W/FLUORO  01/26/2023   IR REPLC GASTRO/COLONIC TUBE PERCUT W/FLUORO  03/06/2023   IR REPLC GASTRO/COLONIC TUBE PERCUT W/FLUORO  04/27/2023   IRRIGATION AND DEBRIDEMENT ABSCESS N/A 06/04/2020   Procedure: IRRIGATION AND DEBRIDEMENT SACRAL WOUND;  Surgeon: Diamantina Monks, MD;  Location: [facility] OR;  Service: General;  Laterality: N/A;   LAPAROSCOPY N/A 06/04/2020   Procedure: LAPAROSCOPY ASSISTED COLOSTOMY, LAPAROSCOPY GASTROSTOMY;  Surgeon: Diamantina Monks, MD;  Location: [facility] OR;  Service: General;  Laterality: N/A;   LYSIS OF ADHESION N/A 06/04/2020   Procedure: LYSIS OF ADHESION;  Surgeon: Diamantina Monks, MD;  Location: [facility] OR;  Service: General;  Laterality: N/A;   melanoma removal     neck   ORTHOPEDIC SURGERY     foot right   POLYPECTOMY  10/13/2022   Procedure: POLYPECTOMY;  Surgeon: Mansouraty, Netty Starring., MD;  Location: [facility] ENDOSCOPY;  Service: Gastroenterology;;   rotator cuff surg     Bil   STENT REMOVAL  10/13/2022   Procedure: CLIP REMOVAL;  Surgeon: Lemar Lofty., MD;  Location: [facility]  ENDOSCOPY;  Service: Gastroenterology;;   SUBMUCOSAL LIFTING INJECTION  10/13/2022   Procedure: SUBMUCOSAL LIFTING INJECTION;  Surgeon: Lemar Lofty., MD;  Location: [facility] ENDOSCOPY;  Service: Gastroenterology;;   SUBMUCOSAL TATTOO INJECTION  10/13/2022   Procedure: SUBMUCOSAL TATTOO INJECTION;  Surgeon: Lemar Lofty., MD;  Location: [facility] ENDOSCOPY;  Service: Gastroenterology;;    Family History  Problem Relation Age of Onset   Lung cancer Mother    COPD Mother    Obesity Mother    Liver cancer Father    Lung cancer Father    Heart disease Father    Heart disease Sister    Colon cancer Neg Hx    Stomach cancer Neg Hx    Pancreatic cancer Neg Hx    Esophageal cancer Neg Hx    Inflammatory bowel disease Neg Hx    Liver disease Neg Hx    Rectal cancer Neg Hx     Social:  reports that he quit smoking about 42 years ago. His smoking use included cigarettes. He started smoking about 72 years ago. He has a 45 pack-year smoking history. He has never used smokeless tobacco. He reports that he does not currently use alcohol after  a past usage of about 7.0 - 14.0 standard drinks of alcohol per week. He reports that he does not use drugs.  Allergies:  Allergies  Allergen Reactions   Ciprofloxacin Other (See Comments)    Hallucinations   Gabapentin Other (See Comments)    Unknown   Levofloxacin Rash   Penicillins Hives and Rash    Tolerated Unasyn, Augmentin, cefepime and cephalexin    Medications: Current Outpatient Medications  Medication Instructions   Alogliptin Benzoate 25 mg, Oral, Daily   Amino Acids-Protein Hydrolys (FEEDING SUPPLEMENT, PRO-STAT 64,) LIQD 30 mLs, Oral, 2 times daily   ascorbic acid (VITAMIN C) 500 mg, Oral, Daily   atenolol (TENORMIN) 12.5-25 mg, Oral, See admin instructions, Take 12.5 mg (1/2 tablet) by mouth once daily in the morning and 25 mg (1 tablet) at bedtime.    atorvastatin (LIPITOR) 20 mg, Oral, Daily   B COMPLEX VITAMINS PO 1  capsule, Oral, Daily   baclofen (LIORESAL) 10 mg, Oral, 3 times daily   cholecalciferol (VITAMIN D3) 1,000 Units, Oral, Daily   ciprofloxacin (CIPRO) 500 mg, Oral, 2 times daily   DULoxetine (CYMBALTA) 30 mg, Oral, Nightly   famotidine (PEPCID) 20 mg, Oral, 2 times daily   fexofenadine (ALLEGRA) 180 mg, Oral, Daily   Fish Oil 1,000 mg, Oral, Daily   gentamicin cream (GARAMYCIN) 0.1 % 1 Application, Topical, Daily, To right 4th digit cuticle after washing with soap and water.    insulin aspart (NOVOLOG) 0-15 Units, Subcutaneous, Every 4 hours, Sliding scale  CBG 70 - 120: 0 units: CBG 121 - 150: 2 units; CBG 151 - 200: 3 units; CBG 201 - 250: 5 units; CBG 251 - 300: 8 units;CBG 301 - 350: 11 units; CBG 351 - 400: 15 units; CBG > 400 : 15 units and notify MD   insulin glargine (LANTUS) 15 Units, Subcutaneous, Daily at bedtime   lamoTRIgine (LAMICTAL) 25 mg, Oral, 3 times daily, For 5 days. Started 02/18/23.    losartan (COZAAR) 75 mg, Oral, Daily   Magnesium Oxide (Elemental) 400 mg, Oral, 2 times daily   melatonin 3 mg, Oral, Daily at bedtime   Menthol, Topical Analgesic, (BIOFREEZE) 4 % GEL 1 application , Topical, 2 times daily PRN   metFORMIN (GLUCOPHAGE) 500 mg, Oral, 2 times daily with meals   nystatin powder 1 Application, Topical, Daily, Apply to peri-wound bed to sacrum and right ischium daily with routine dressing.    olopatadine (PATANOL) 0.1 % ophthalmic solution 1 drop, Both Eyes, 2 times daily   omeprazole (PRILOSEC) 40 mg, Oral, 2 times daily, 30 minutes before breakfast/dinner   ondansetron (ZOFRAN) 4 mg, Oral, Every 8 hours PRN   Polyethyl Glycol-Propyl Glycol (GENTEAL TEARS SEVERE DAY/NIGHT) 0.4-0.3 % GEL ophthalmic gel 1 Application, Both Eyes, 4 times daily PRN   PSYLLIUM PO 0.8 g, Oral, 2 times daily   senna-docusate (SENOKOT-S) 8.6-50 MG tablet 1 tablet, Oral, 2 times daily   spironolactone (ALDACTONE) 25 mg, Oral, Daily   traMADol (ULTRAM) 50 mg, Oral, Daily PRN    traZODone (DESYREL) 75 mg, Oral, Daily at bedtime    ROS - all of the below systems have been reviewed with the patient and positives are indicated with bold text General: chills, fever or night sweats Eyes: blurry vision or double vision ENT: epistaxis or sore throat Allergy/Immunology: itchy/watery eyes or nasal congestion Hematologic/Lymphatic: bleeding problems, blood clots or swollen lymph nodes Endocrine: temperature intolerance or unexpected weight changes Breast: new or changing breast lumps or nipple  discharge Resp: cough, shortness of breath, or wheezing CV: chest pain or dyspnea on exertion GI: as per HPI GU: dysuria, trouble voiding, or hematuria MSK: joint pain or joint stiffness Neuro: TIA or stroke symptoms Derm: pruritus and skin lesion changes Psych: anxiety and depression  Objective   PE Blood pressure (!) 166/60, pulse (!) 56, temperature 97.8 F (36.6 C), temperature source Oral, resp. rate 16, height 6' (1.829 m), weight 97 kg, SpO2 98%. Constitutional: NAD; conversant; no deformities Eyes: Moist conjunctiva; no lid lag; anicteric; PERRL Neck: Trachea midline; no thyromegaly Lungs: Normal respiratory effort; no tactile fremitus CV: RRR; no palpable thrills; no pitting edema GI: Abd Ostomy with stool, pouch over GC fistula full of gastric contents; no palpable hepatosplenomegaly MSK: Normal range of motion of extremities; no clubbing/cyanosis Psychiatric: Appropriate affect; alert and oriented x3 Lymphatic: No palpable cervical or axillary lymphadenopathy  No results found for this or any previous visit (from the past 24 hour(s)).   Imaging Orders         CT ABDOMEN PELVIS W CONTRAST      EGD 10/13/22 Mansouraty EGD impression: - No gross lesions in the entire esophagus. Z- line irregular, 39 cm from the incisors. - A previously placed OVESCO clip was found in the anterior stomach at the area of previous GC fistula. This was removed. The gastric fistula  was exposed. This was treated with thermal therapy and subsequent replacement of another OVESCO GC clip with what was felt to be good effect. - A single gastric polyp in the - A single gastric polyp in the gastric body ( similar location to what was previously described) was found and subsequently resected and retrieved after EUS. Clips ( MR conditional) were placed. Clip manufacturer: [employer]. Tattooed laterally. - Erythematous mucosa in the stomach; not rebiopsied. - No gross lesions in the duodenal bulb, in the first portion of the duodenum and in the second portion of the duodenum. EUS impression: - Endosonographic imaging of the entire stomach showed no evidence of an intramural lesion. - No malignant- appearing lymph nodes were visualized in the celiac region ( level 20) , peripancreatic region and porta hepatis region.  EGD 07/11/22 Mansouraty - No gross lesions in esophagus. Z- line irregular, 41 cm from the incisors. - 1 cm hiatal hernia. - A large amount of food ( residue) in the stomach. Removal was partially successful. - Gastrocutaneous fistula noted in the anterior portion of the stomach. APC to degranulated tissue with subsequent OVESCO 12/ 6 GC OTS see placed in attempt of closure of the fistula. - Retained food in the duodenum. - As result of the foodstuffs present, EUS with EMR of gastric neuroendocrine tumor not able to be performed today.  EGD 03/26/22 Leone Payor - Gastrostomy tract present and patent - A single gastric polyp. Biopsied. - A single gastric polyp. Biopsied. - Erythematous mucosa in the stomach. - A small amount of food ( residue) in the stomach. - Normal examined duodenum. - The examination was otherwise normal. NO GASTRIC OUTLET OBSTRUCTION - I THINK THE PROBLEM IS THAT THE GASTROSTOMY TRACT WILL NOT HEAL. NO TUBULAR STRUCTURE AS SUSPECTED ON CT    Assessment and Plan   Brent Deighan. is an 77 y.o. male with a GC fistula.  CT scan is pending.  I recommend  admission to the medicine service, resuscitation, twice daily protonix, NPO status and TPN for now.  Then we can quantify the amount draining from the fistula.  I would not  recommend repeat minimally invasive attempts at treating this fistula.  He may require surgical intervention for this fistula.  NJ tube feeding may be an option as well.  After the CT scan and once the fistula output is quantified, we will provide additional recommendations.  Quentin Ore, MD  [facility] Use [email or web address] to contact on call provider  New Patient Billing: [id number] - High MDM

## 2023-05-21 NOTE — H&P (Signed)
History and Physical    Umut Kizzire. AYT:[id number] DOB: 1946-07-16 DOA: 05/21/2023  I have briefly reviewed the patient's prior medical records in [facility]  PCP: [name], MD  Patient coming from: home  Chief Complaint: Gastrocutaneous fistula  HPI: Casanova Montford. is a 77 y.o. 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  G-tube was initially placed in 2021 when he got diagnosed with his neurological issues, was removed in May 2023 but the gastrocutaneous fistula has failed to heal since then, several attempts to close have been attempted in 2023 and 2024.  His fistula has grown larger and he decided to come to the hospital.  He denies any fever or chills, denies any nausea or vomiting.  No chest pain, no shortness of breath  ED Course: In the emergency room he is afebrile, normotensive, satting well on room air.  Blood work essentially unremarkable.  General surgery consulted, will get a CT scan to further evaluate, and we are asked to admit.   Review of Systems: All systems reviewed, and apart from HPI, all negative  Past Medical History:  Diagnosis Date   Allergy    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 angioplasty of his RCA in 1990. b. bare metal stent placed in the RCA in 2000 followed by rotational atherectomy shortly after for stent restenosis. c. last cath was in 2012 showing stable moderate diffuse CAD. (70% mid LAD, 80% diagonal, 70% Ramus, 40% mid to distal RCA stent restenosis). d. Low risk nuc in 2015.   Diabetes mellitus    Diverticulosis    Elevated CK    Erectile dysfunction    Hemorrhoids    HTN (hypertension)    Hyperlipidemia    Hypertriglyceridemia    Malignant melanoma of left side  of neck (HCC) 10/25/2018   Myocardial infarction (HCC)    Obesity    OSA (obstructive sleep apnea)    Persistent disorder of initiating or maintaining sleep    Personal history of colonic polyps 02/05/2003   Renal lesion 06/20/2016    Past Surgical History:  Procedure Laterality Date   BIOPSY  03/26/2022   Procedure: BIOPSY;  Surgeon: Iva Boop, MD;  Location: [facility] ENDOSCOPY;  Service: Gastroenterology;;   CHOLECYSTECTOMY     CORONARY ANGIOPLASTY     CORONARY STENT PLACEMENT     stenting of the right coronary artery with followup rotational  atherectomy. (3 stents placed)   ENDOSCOPIC MUCOSAL RESECTION  10/13/2022   Procedure: ENDOSCOPIC MUCOSAL RESECTION;  Surgeon: Meridee Score Netty Starring., MD;  Location: [facility] ENDOSCOPY;  Service: Gastroenterology;;   ESOPHAGOGASTRODUODENOSCOPY (EGD) WITH PROPOFOL N/A 03/26/2022   Procedure: ESOPHAGOGASTRODUODENOSCOPY (EGD) WITH PROPOFOL;  Surgeon: Iva Boop, MD;  Location: [facility] ENDOSCOPY;  Service: Gastroenterology;  Laterality: N/A;   ESOPHAGOGASTRODUODENOSCOPY (EGD) WITH PROPOFOL N/A 07/11/2022   Procedure: ESOPHAGOGASTRODUODENOSCOPY (EGD) WITH PROPOFOL;  Surgeon: Meridee Score Netty Starring., MD;  Location: [facility] ENDOSCOPY;  Service: Gastroenterology;  Laterality: N/A;   ESOPHAGOGASTRODUODENOSCOPY (EGD) WITH PROPOFOL N/A 10/13/2022   Procedure: ESOPHAGOGASTRODUODENOSCOPY (EGD) WITH PROPOFOL;  Surgeon: Meridee Score Netty Starring., MD;  Location: [facility] ENDOSCOPY;  Service: Gastroenterology;  Laterality: N/A;   EUS N/A 10/13/2022   Procedure: UPPER ENDOSCOPIC ULTRASOUND (EUS) RADIAL;  Surgeon: Lemar Lofty., MD;  Location: [facility] ENDOSCOPY;  Service: Gastroenterology;  Laterality: N/A;  FINGER SURGERY     right   FOOT SURGERY     right   HEMOSTASIS CLIP PLACEMENT  07/11/2022   Procedure: HEMOSTASIS CLIP PLACEMENT;  Surgeon: Lemar Lofty., MD;  Location: [facility] ENDOSCOPY;  Service: Gastroenterology;;  ovesco    HEMOSTASIS CLIP PLACEMENT  10/13/2022    Procedure: HEMOSTASIS CLIP PLACEMENT;  Surgeon: Lemar Lofty., MD;  Location: [facility] ENDOSCOPY;  Service: Gastroenterology;;   HOT HEMOSTASIS N/A 07/11/2022   Procedure: HOT HEMOSTASIS (ARGON PLASMA COAGULATION/BICAP);  Surgeon: Lemar Lofty., MD;  Location: [facility] ENDOSCOPY;  Service: Gastroenterology;  Laterality: N/A;   HOT HEMOSTASIS N/A 10/13/2022   Procedure: HOT HEMOSTASIS (ARGON PLASMA COAGULATION/BICAP);  Surgeon: Lemar Lofty., MD;  Location: [facility] ENDOSCOPY;  Service: Gastroenterology;  Laterality: N/A;   INGUINAL HERNIA REPAIR     right   IR FLUORO PROCEDURE UNLISTED  02/21/2023   IR GASTROSTOMY TUBE REMOVAL  02/10/2022   IR REPLACE G-TUBE SIMPLE WO FLUORO  03/14/2023   IR REPLC GASTRO/COLONIC TUBE PERCUT W/FLUORO  12/22/2022   IR REPLC GASTRO/COLONIC TUBE PERCUT W/FLUORO  01/26/2023   IR REPLC GASTRO/COLONIC TUBE PERCUT W/FLUORO  03/06/2023   IR REPLC GASTRO/COLONIC TUBE PERCUT W/FLUORO  04/27/2023   IRRIGATION AND DEBRIDEMENT ABSCESS N/A 06/04/2020   Procedure: IRRIGATION AND DEBRIDEMENT SACRAL WOUND;  Surgeon: Diamantina Monks, MD;  Location: [facility] OR;  Service: General;  Laterality: N/A;   LAPAROSCOPY N/A 06/04/2020   Procedure: LAPAROSCOPY ASSISTED COLOSTOMY, LAPAROSCOPY GASTROSTOMY;  Surgeon: Diamantina Monks, MD;  Location: [facility] OR;  Service: General;  Laterality: N/A;   LYSIS OF ADHESION N/A 06/04/2020   Procedure: LYSIS OF ADHESION;  Surgeon: Diamantina Monks, MD;  Location: [facility] OR;  Service: General;  Laterality: N/A;   melanoma removal     neck   ORTHOPEDIC SURGERY     foot right   POLYPECTOMY  10/13/2022   Procedure: POLYPECTOMY;  Surgeon: Lemar Lofty., MD;  Location: [facility] ENDOSCOPY;  Service: Gastroenterology;;   rotator cuff surg     Bil   STENT REMOVAL  10/13/2022   Procedure: CLIP REMOVAL;  Surgeon: Lemar Lofty., MD;  Location: [facility] ENDOSCOPY;  Service: Gastroenterology;;   SUBMUCOSAL LIFTING INJECTION  10/13/2022   Procedure: SUBMUCOSAL LIFTING  INJECTION;  Surgeon: Lemar Lofty., MD;  Location: [facility] ENDOSCOPY;  Service: Gastroenterology;;   SUBMUCOSAL TATTOO INJECTION  10/13/2022   Procedure: SUBMUCOSAL TATTOO INJECTION;  Surgeon: Lemar Lofty., MD;  Location: [facility] ENDOSCOPY;  Service: Gastroenterology;;     reports that he quit smoking about 42 years ago. His smoking use included cigarettes. He started smoking about 72 years ago. He has a 45 pack-year smoking history. He has never used smokeless tobacco. He reports that he does not currently use alcohol after a past usage of about 7.0 - 14.0 standard drinks of alcohol per week. He reports that he does not use drugs.  Allergies  Allergen Reactions   Ciprofloxacin Other (See Comments)    Hallucinations   Gabapentin Other (See Comments)    Unknown   Levofloxacin Rash   Penicillins Hives and Rash    Tolerated Unasyn, Augmentin, cefepime and cephalexin    Family History  Problem Relation Age of Onset   Lung cancer Mother    COPD Mother    Obesity Mother    Liver cancer Father    Lung cancer Father    Heart disease Father    Heart disease Sister    Colon cancer Neg Hx  Stomach cancer Neg Hx    Pancreatic cancer Neg Hx    Esophageal cancer Neg Hx    Inflammatory bowel disease Neg Hx    Liver disease Neg Hx    Rectal cancer Neg Hx     Prior to Admission medications   Medication Sig Start Date End Date Taking? Authorizing Provider  Alogliptin Benzoate 25 MG TABS Take 25 mg by mouth daily.    [provider]  Amino Acids-Protein Hydrolys (FEEDING SUPPLEMENT, PRO-STAT 64,) LIQD Take 30 mLs by mouth in the morning and at bedtime.    [provider]  atenolol (TENORMIN) 25 MG tablet Take 12.5-25 mg by mouth See admin instructions. Take 12.5 mg (1/2 tablet) by mouth once daily in the morning and 25 mg (1 tablet) at bedtime.    [provider]  atorvastatin (LIPITOR) 20 MG tablet Take 1 tablet (20 mg total) by mouth daily. Patient  taking differently: Take 20 mg by mouth at bedtime. 03/26/20   Burnadette Pop, MD  B COMPLEX VITAMINS PO Take 1 capsule by mouth daily.    [provider]  baclofen (LIORESAL) 10 MG tablet Take 10 mg by mouth 3 (three) times daily.    [provider]  cholecalciferol (VITAMIN D3) 25 MCG (1000 UNIT) tablet Take 1,000 Units by mouth daily.    [provider]  ciprofloxacin (CIPRO) 500 MG tablet Take 1 tablet (500 mg total) by mouth 2 (two) times daily. 02/25/23   Leroy Sea, MD  DULoxetine (CYMBALTA) 30 MG capsule Take 30 mg by mouth at bedtime.    [provider]  famotidine (PEPCID) 20 MG tablet Take 20 mg by mouth 2 (two) times daily.    [provider]  fexofenadine (ALLEGRA) 180 MG tablet Take 180 mg by mouth daily.    [provider]  gentamicin cream (GARAMYCIN) 0.1 % Apply 1 Application topically daily. To right 4th digit cuticle after washing with soap and water. 10/05/22   [provider]  insulin aspart (NOVOLOG) 100 UNIT/ML injection Inject 0-15 Units into the skin every 4 (four) hours. Sliding scale  CBG 70 - 120: 0 units: CBG 121 - 150: 2 units; CBG 151 - 200: 3 units; CBG 201 - 250: 5 units; CBG 251 - 300: 8 units;CBG 301 - 350: 11 units; CBG 351 - 400: 15 units; CBG > 400 : 15 units and notify MD Patient taking differently: Inject 26 Units into the skin See admin instructions. Inject 26 units into the skin before meals. Do not give if CBG <164ml/dl. 06/10/20   Rai, Ripudeep K, MD  insulin glargine (LANTUS) 100 unit/mL SOPN Inject 15 Units into the skin at bedtime. Patient taking differently: Inject 75 Units into the skin daily. 06/10/20   Rai, Delene Ruffini, MD  lamoTRIgine (LAMICTAL) 25 MG tablet Take 25 mg by mouth 3 (three) times daily. For 5 days. Started 02/18/23.    [provider]  losartan (COZAAR) 50 MG tablet Take 75 mg by mouth daily.    [provider]  Magnesium Oxide, Elemental, 400 MG TABS Take  400 mg by mouth 2 (two) times daily.    [provider]  melatonin 3 MG TABS tablet Take 3 mg by mouth at bedtime.    [provider]  Menthol, Topical Analgesic, (BIOFREEZE) 4 % GEL Apply 1 application  topically 2 (two) times daily as needed (Pain to back upper and lower arms and shoulders).    [provider]  metFORMIN (GLUCOPHAGE) 500 MG tablet Take 500 mg by mouth 2 (two) times daily with a meal.    [provider]  nystatin powder Apply 1 Application topically daily. Apply to peri-wound bed to sacrum and right ischium daily with routine dressing.    [provider]  olopatadine (PATANOL) 0.1 % ophthalmic solution Place 1 drop into both eyes 2 (two) times daily. 02/25/23   Leroy Sea, MD  Omega-3 Fatty Acids (FISH OIL) 1000 MG CAPS Take 1,000 mg by mouth daily.    [provider]  omeprazole (PRILOSEC) 40 MG capsule Take 1 capsule (40 mg total) by mouth 2 (two) times daily. 30 minutes before breakfast/dinner Patient taking differently: Take 40 mg by mouth 2 (two) times daily. 10/13/22   Mansouraty, Netty Starring., MD  ondansetron (ZOFRAN) 4 MG tablet Take 4 mg by mouth every 8 (eight) hours as needed for nausea.    [provider]  Polyethyl Glycol-Propyl Glycol (GENTEAL TEARS SEVERE DAY/NIGHT) 0.4-0.3 % GEL ophthalmic gel Place 1 Application into both eyes 4 (four) times daily as needed (Dry Eyes).    [provider]  PSYLLIUM PO Take 0.8 g by mouth in the morning and at bedtime.    [provider]  senna-docusate (SENOKOT-S) 8.6-50 MG tablet Take 1 tablet by mouth 2 (two) times daily.    [provider]  spironolactone (ALDACTONE) 25 MG tablet Take 25 mg by mouth daily.    [provider]  traMADol (ULTRAM) 50 MG tablet Take 50 mg by mouth daily as needed (Pain).    [provider]  traZODone (DESYREL) 50 MG tablet Take 75 mg by mouth at bedtime.    [provider]  vitamin C  (ASCORBIC ACID) 500 MG tablet Take 500 mg by mouth daily.    [provider]    Physical Exam: Vitals:   05/21/23 1146 05/21/23 1147 05/21/23 1454  BP: (!) 166/60  (!) 156/84  Pulse: (!) 56  78  Resp: 16  16  Temp: 97.8 F (36.6 C)    TempSrc: Oral    SpO2: 98%  98%  Weight:  97 kg   Height:  6' (1.829 m)     Constitutional: NAD, calm, comfortable Eyes: PERRL, lids and conjunctivae normal ENMT: Mucous membranes are moist.  Neck: normal, supple Respiratory: clear to auscultation bilaterally, no wheezing, no crackles. Normal respiratory effort.  Cardiovascular: Regular rate and rhythm, no murmurs / rubs / gallops. No extremity edema. 2+ pedal pulses.  Abdomen: no tenderness, no masses palpated.  Large fistula present with an ostomy bag attached.  Suprapubic catheter present.  Colostomy bag present  Skin: Bilateral lower extremities wrapped Neurologic: Paraplegic Psychiatric: Normal judgment and insight. Alert and oriented x 3. Normal mood.   Labs on Admission: I have personally reviewed following labs and imaging studies  CBC: Recent Labs  Lab 05/21/23 1347  WBC 7.9  NEUTROABS 4.9  HGB 11.4*  HCT 37.3*  MCV 88.2  PLT 293   Basic Metabolic Panel: Recent Labs  Lab 05/21/23 1347  NA 139  K 3.8  CL 105  CO2 23  GLUCOSE 91  BUN 11  CREATININE 0.59*  CALCIUM 8.8*   Liver Function Tests: Recent Labs  Lab 05/21/23 1347  AST 33  ALT 28  ALKPHOS 72  BILITOT 0.4  PROT 6.0*  ALBUMIN 3.0*   Coagulation Profile: No results for input(s): "INR", "PROTIME" in the last 168 hours. BNP (last 3 results) No results for input(s): "  PROBNP" in the last 8760 hours. CBG: No results for input(s): "GLUCAP" in the last 168 hours. Thyroid Function Tests: No results for input(s): "TSH", "T4TOTAL", "FREET4", "T3FREE", "THYROIDAB" in the last 72 hours. Urine analysis:    Component Value Date/Time   COLORURINE YELLOW 03/07/2023 2128   APPEARANCEUR CLEAR 03/07/2023  2128   LABSPEC 1.018 03/07/2023 2128   PHURINE 6.0 03/07/2023 2128   GLUCOSEU >=500 (A) 03/07/2023 2128   HGBUR NEGATIVE 03/07/2023 2128   BILIRUBINUR NEGATIVE 03/07/2023 2128   BILIRUBINUR n 03/18/2015 0928   KETONESUR NEGATIVE 03/07/2023 2128   PROTEINUR 100 (A) 03/07/2023 2128   UROBILINOGEN 2.0 03/18/2015 0928   UROBILINOGEN 1.0 07/16/2012 1345   NITRITE NEGATIVE 03/07/2023 2128   LEUKOCYTESUR TRACE (A) 03/07/2023 2128     Radiological Exams on Admission: No results found.   Assessment/Plan Principal problem Gastrocutaneous fistula-surgery consulted, obtain CT scan.  Of the CT scan is done, will discuss with the surgical team whether NJ tube is an option versus PICC line.  For now n.p.o., D5 NS at 75 cc/h  Active problems Insulin-dependent diabetes mellitus -resume home long-acting insulin at a much lower dose given n.p.o., placed on sliding scale Q6  Essential hypertension-hold home medications for now as he is strict NPO.  Provide IV PRN  Hyperlipidemia-hold home statin  Stage IV sacral ulcer, chronic, POA-will evaluate once he goes up to the floor, wound care consult  Neurogenic bladder, with suprapubic catheter -monitor, currently afebrile, no leukocytosis, do not suspect UTI  Depression-hold oral agents since he is strict n.p.o.  Functional quadriplegia secondary to transverse myelitis, history of NMO-noted  DVT prophylaxis: Lovenox  Code Status: Full code per patient  Family Communication: No family present at bedside Disposition Plan: Admit to MedSurg Consults called: General Surgery Obs/Inp: Inpatient  At the time of admission, it appears that the appropriate admission status for this patient is INPATIENT as it is expected that patient will require hospital care > 2 midnights. This is judged to be reasonable and necessary in order to provide the required intensity of service to ensure the patient's safety given: presenting symptoms, initial radiographic and  laboratory data and in the context of their chronic comorbidities. Together, these circumstances are felt to place patient at high at high risk for further clinical deterioration threatening life, limb, or organ.  Pamella Pert, MD, PhD [facility]  Contact via [email or web address]  05/21/2023, 3:28 PM

## 2023-05-21 NOTE — Plan of Care (Signed)

## 2023-05-21 NOTE — ED Notes (Signed)
Stechschulte, Gen. Surg., paged per verbal order of Dr Rush Landmark

## 2023-05-21 NOTE — ED Provider Notes (Signed)
[place] EMERGENCY DEPARTMENT AT [facility] Provider Note   CSN: [id number] Arrival date & time: 05/21/23  1126     History  No chief complaint on file.   Brent [name]. is a 77 y.o. male.  The history is provided by the patient and medical records. No language interpreter was used.  Abdominal Pain Pain location:  Generalized Pain quality: aching   Pain radiates to:  Does not radiate Pain severity:  Moderate Onset quality:  Unable to specify Timing:  Unable to specify Progression:  Unable to specify Context: not trauma   Relieved by:  Nothing Worsened by:  Nothing Ineffective treatments:  None tried Associated symptoms: nausea   Associated symptoms: no chest pain, no chills, no constipation, no cough, no diarrhea, no dysuria, no fatigue, no fever, no shortness of breath and no vomiting        Home Medications Prior to Admission medications   Medication Sig Start Date End Date Taking? Authorizing Provider  Alogliptin Benzoate 25 MG TABS Take 25 mg by mouth daily.    [provider]  Amino Acids-Protein Hydrolys (FEEDING SUPPLEMENT, PRO-STAT 64,) LIQD Take 30 mLs by mouth in the morning and at bedtime.    [provider]  atenolol (TENORMIN) 25 MG tablet Take 12.5-25 mg by mouth See admin instructions. Take 12.5 mg (1/2 tablet) by mouth once daily in the morning and 25 mg (1 tablet) at bedtime.    [provider]  atorvastatin (LIPITOR) 20 MG tablet Take 1 tablet (20 mg total) by mouth daily. Patient taking differently: Take 20 mg by mouth at bedtime. 03/26/20   Burnadette Pop, MD  B COMPLEX VITAMINS PO Take 1 capsule by mouth daily.    [provider]  baclofen (LIORESAL) 10 MG tablet Take 10 mg by mouth 3 (three) times daily.    [provider]  cholecalciferol (VITAMIN D3) 25 MCG (1000 UNIT) tablet Take 1,000 Units by mouth daily.    [provider]  ciprofloxacin (CIPRO) 500 MG tablet Take 1 tablet  (500 mg total) by mouth 2 (two) times daily. 02/25/23   Leroy Sea, MD  DULoxetine (CYMBALTA) 30 MG capsule Take 30 mg by mouth at bedtime.    [provider]  famotidine (PEPCID) 20 MG tablet Take 20 mg by mouth 2 (two) times daily.    [provider]  fexofenadine (ALLEGRA) 180 MG tablet Take 180 mg by mouth daily.    [provider]  gentamicin cream (GARAMYCIN) 0.1 % Apply 1 Application topically daily. To right 4th digit cuticle after washing with soap and water. 10/05/22   [provider]  insulin aspart (NOVOLOG) 100 UNIT/ML injection Inject 0-15 Units into the skin every 4 (four) hours. Sliding scale  CBG 70 - 120: 0 units: CBG 121 - 150: 2 units; CBG 151 - 200: 3 units; CBG 201 - 250: 5 units; CBG 251 - 300: 8 units;CBG 301 - 350: 11 units; CBG 351 - 400: 15 units; CBG > 400 : 15 units and notify MD Patient taking differently: Inject 26 Units into the skin See admin instructions. Inject 26 units into the skin before meals. Do not give if CBG <120ml/dl. 06/10/20   Rai, Ripudeep K, MD  insulin glargine (LANTUS) 100 unit/mL SOPN Inject 15 Units into the skin at bedtime. Patient taking differently: Inject 75 Units into the skin daily. 06/10/20   Rai, Delene Ruffini, MD  lamoTRIgine (LAMICTAL) 25 MG tablet Take 25 mg  by mouth 3 (three) times daily. For 5 days. Started 02/18/23.    [provider]  losartan (COZAAR) 50 MG tablet Take 75 mg by mouth daily.    [provider]  Magnesium Oxide, Elemental, 400 MG TABS Take 400 mg by mouth 2 (two) times daily.    [provider]  melatonin 3 MG TABS tablet Take 3 mg by mouth at bedtime.    [provider]  Menthol, Topical Analgesic, (BIOFREEZE) 4 % GEL Apply 1 application  topically 2 (two) times daily as needed (Pain to back upper and lower arms and shoulders).    [provider]  metFORMIN (GLUCOPHAGE) 500 MG tablet Take 500 mg by mouth 2 (two) times daily with a meal.     [provider]  nystatin powder Apply 1 Application topically daily. Apply to peri-wound bed to sacrum and right ischium daily with routine dressing.    [provider]  olopatadine (PATANOL) 0.1 % ophthalmic solution Place 1 drop into both eyes 2 (two) times daily. 02/25/23   Leroy Sea, MD  Omega-3 Fatty Acids (FISH OIL) 1000 MG CAPS Take 1,000 mg by mouth daily.    [provider]  omeprazole (PRILOSEC) 40 MG capsule Take 1 capsule (40 mg total) by mouth 2 (two) times daily. 30 minutes before breakfast/dinner Patient taking differently: Take 40 mg by mouth 2 (two) times daily. 10/13/22   Mansouraty, Netty Starring., MD  ondansetron (ZOFRAN) 4 MG tablet Take 4 mg by mouth every 8 (eight) hours as needed for nausea.    [provider]  Polyethyl Glycol-Propyl Glycol (GENTEAL TEARS SEVERE DAY/NIGHT) 0.4-0.3 % GEL ophthalmic gel Place 1 Application into both eyes 4 (four) times daily as needed (Dry Eyes).    [provider]  PSYLLIUM PO Take 0.8 g by mouth in the morning and at bedtime.    [provider]  senna-docusate (SENOKOT-S) 8.6-50 MG tablet Take 1 tablet by mouth 2 (two) times daily.    [provider]  spironolactone (ALDACTONE) 25 MG tablet Take 25 mg by mouth daily.    [provider]  traMADol (ULTRAM) 50 MG tablet Take 50 mg by mouth daily as needed (Pain).    [provider]  traZODone (DESYREL) 50 MG tablet Take 75 mg by mouth at bedtime.    [provider]  vitamin C (ASCORBIC ACID) 500 MG tablet Take 500 mg by mouth daily.    [provider]      Allergies    Ciprofloxacin, Gabapentin, Levofloxacin, and Penicillins    Review of Systems   Review of Systems  Constitutional:  Negative for chills, fatigue and fever.  HENT:  Negative for congestion.   Respiratory:  Negative for cough, chest tightness, shortness of breath and wheezing.   Cardiovascular:  Negative for chest pain.   Gastrointestinal:  Positive for abdominal pain and nausea. Negative for constipation, diarrhea and vomiting.  Genitourinary:  Negative for dysuria and flank pain.  Musculoskeletal:  Negative for back pain and neck pain.  Skin:  Positive for wound.  Psychiatric/Behavioral:  Negative for agitation and confusion.     Physical Exam Updated Vital Signs BP (!) 166/60   Pulse (!) 56   Temp 97.8 F (36.6 C) (Oral)   Resp 16   Ht 6' (1.829 m)   Wt 97 kg   SpO2 98%   BMI 29.00 kg/m  Physical Exam Vitals and nursing note reviewed.  Constitutional:  General: He is not in acute distress.    Appearance: He is well-developed. He is not ill-appearing, toxic-appearing or diaphoretic.  HENT:     Head: Normocephalic and atraumatic.     Nose: No congestion or rhinorrhea.     Mouth/Throat:     Mouth: Mucous membranes are moist.  Eyes:     Extraocular Movements: Extraocular movements intact.     Conjunctiva/sclera: Conjunctivae normal.     Pupils: Pupils are equal, round, and reactive to light.  Cardiovascular:     Rate and Rhythm: Normal rate and regular rhythm.     Heart sounds: No murmur heard. Pulmonary:     Effort: Pulmonary effort is normal. No respiratory distress.     Breath sounds: Normal breath sounds. No wheezing, rhonchi or rales.  Chest:     Chest wall: No tenderness.  Abdominal:     General: Abdomen is flat.     Palpations: Abdomen is soft.     Tenderness: There is abdominal tenderness. There is no guarding or rebound.  Musculoskeletal:        General: No swelling or tenderness.     Cervical back: Neck supple.  Skin:    General: Skin is warm and dry.     Capillary Refill: Capillary refill takes less than 2 seconds.  Neurological:     Mental Status: He is alert. Mental status is at baseline.     ED Results / Procedures / Treatments   Labs (all labs ordered are listed, but only abnormal results are displayed) Labs Reviewed  CBC WITH DIFFERENTIAL/PLATELET -  Abnormal; Notable for the following components:      Result Value   Hemoglobin 11.4 (*)    HCT 37.3 (*)    RDW 15.9 (*)    All other components within normal limits  COMPREHENSIVE METABOLIC PANEL - Abnormal; Notable for the following components:   Creatinine, Ser 0.59 (*)    Calcium 8.8 (*)    Total Protein 6.0 (*)    Albumin 3.0 (*)    All other components within normal limits  LACTIC ACID, PLASMA  LIPASE, BLOOD  LACTIC ACID, PLASMA    EKG None  Radiology No results found.  Procedures Procedures    Medications Ordered in ED Medications - No data to display  ED Course/ Medical Decision Making/ A&P                                 Medical Decision Making Amount and/or Complexity of Data Reviewed Labs: ordered. Radiology: ordered.  Risk Decision regarding hospitalization.    Wagner Keomany. is a 77 y.o. male with a past medical history significant for hypertension, diabetes, paraplegia with suprapubic catheter and ostomy as well a previous G-tube with enlarging site who presents with G-tube problem.  According to patient, after had a G-tube placed last year, it has been growing in size and they have been getting larger and larger G-tube.  They told him that if it gets to the point where a size 30 cannot fit, he may need surgery.  He reports that on Thursday, 4 days ago, the 30 size G-tube fell out and now he has an ostomy bag over the site with it pouring gastric contents out.  He also reports he is had more abdominal pain for the last few days as well as some nausea.  No vomiting reported.  He denies new trauma.  Denies fevers  or chills.  Reports some chronic cough that is not any different.  No chest pain or shortness of breath.  On exam, patient does have a G-tube site that is large and has bubbling liquid coming out of it.  He does have tenderness across his abdomen but there was no significant erythema initially.  He has a super B catheter in place and ostomy in  place otherwise without significant surrounding tenderness.  Lungs were clear.  Patient resting comfortably.  Spoke to Dr. Matilde Haymaker sheltie with general surgery who recommended a CT on pelvis with oral contrast to get a sense of the tract of this but he does feel the patient needs to be admitted to medicine for further management.  He recommended n.p.o., TPN, and seeing if it will close and if not he will need surgery.  Will call medicine for admission after labs and imaging.  Will call for admission now the labs have returned.  Patient's hemoglobin slightly low but normal white count.  Metabolic panel showed mild hypocalcemia but otherwise electrolytes were not critically abnormal.  Lipase normal.  Lactic acid normal.  Patient will get the CT scan but will call for admission with general surgery to follow to make a plan.  Medicine will know for further management with general surgery in consultation.        Final Clinical Impression(s) / ED Diagnoses Final diagnoses:  Complication of feeding tube (HCC)     Clinical Impression: 1. Complication of feeding tube [facility])     Disposition: Admit  This note was prepared with assistance of Dragon voice recognition software. Occasional wrong-word or sound-a-like substitutions may have occurred due to the inherent limitations of voice recognition software.      Sherif Millspaugh, Canary Brim, MD 05/21/23 1520

## 2023-05-21 NOTE — ED Notes (Signed)
ED TO INPATIENT HANDOFF REPORT  ED Nurse Name and Phone #: [phone]  S Name/Age/Gender Brent [name] 77 y.o. male Room/Bed: H020C/H020C  Code Status   Code Status: Full Code  Home/SNF/Other Nursing Home Patient oriented to: self, place, time, and situation Is this baseline? 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 Pt had G tube displaced 3 days ago. Per facility it was a size  30. Pt has no pain at this time.     Allergies Allergies  Allergen Reactions   Ciprofloxacin Other (See Comments)    Hallucinations   Gabapentin Other (See Comments)    Unknown   Levofloxacin Rash   Penicillins Hives and Rash    Tolerated Unasyn, Augmentin, cefepime and cephalexin    Level of Care/Admitting Diagnosis ED Disposition     ED Disposition  Admit   Condition  --   Comment  Hospital Area: [facility] [[id number]]  Level of Care: Med-Surg [16]  May admit patient to [facility] or [facility] if equivalent level of care is available:: No  Covid Evaluation: Asymptomatic - no recent exposure (last 10 days) testing not required  Diagnosis: Gastrocutaneous fistula [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 angioplasty of his RCA in 1990. b. bare metal stent placed in the RCA in 2000 followed by rotational atherectomy shortly after for stent restenosis. c. last cath was in 2012 showing stable moderate diffuse CAD. (70% mid LAD, 80% diagonal, 70% Ramus, 40% mid to distal RCA stent restenosis). d. Low  risk nuc in 2015.   Diabetes mellitus    Diverticulosis    Elevated CK    Erectile dysfunction    Hemorrhoids    HTN (hypertension)    Hyperlipidemia    Hypertriglyceridemia    Malignant melanoma of left side of neck (HCC) 10/25/2018   Myocardial infarction (HCC)    Obesity    OSA (obstructive sleep apnea)    Persistent disorder of initiating or maintaining sleep    Personal history of colonic polyps 02/05/2003   Renal lesion 06/20/2016   Past Surgical History:  Procedure Laterality Date   BIOPSY  03/26/2022   Procedure: BIOPSY;  Surgeon: Iva Boop, MD;  [facility] ENDOSCOPY;  Service: Gastroenterology;;   CHOLECYSTECTOMY     CORONARY ANGIOPLASTY     CORONARY STENT PLACEMENT     stenting of the right coronary artery with followup rotational  atherectomy. (3 stents placed)   ENDOSCOPIC MUCOSAL RESECTION  10/13/2022   Procedure: ENDOSCOPIC MUCOSAL RESECTION;  Surgeon: Meridee Score Netty Starring., MD;  Location: [facility] ENDOSCOPY;  Service: Gastroenterology;;   ESOPHAGOGASTRODUODENOSCOPY (EGD) WITH PROPOFOL N/A 03/26/2022   Procedure: ESOPHAGOGASTRODUODENOSCOPY (EGD) WITH PROPOFOL;  Surgeon: Iva Boop, MD;  Location[facility] ENDOSCOPY;  Service: Gastroenterology;  Laterality: N/A;   ESOPHAGOGASTRODUODENOSCOPY (EGD) WITH PROPOFOL N/A 07/11/2022   Procedure: ESOPHAGOGASTRODUODENOSCOPY (EGD) WITH PROPOFOL;  Surgeon: Meridee Score Netty Starring., MD;  Location: [facility] ENDOSCOPY;  Service: Gastroenterology;  Laterality: N/A;   ESOPHAGOGASTRODUODENOSCOPY (EGD) WITH PROPOFOL N/A 10/13/2022  Procedure: ESOPHAGOGASTRODUODENOSCOPY (EGD) WITH PROPOFOL;  Surgeon: Meridee Score Netty Starring., MD;  Location: [facility] ENDOSCOPY;  Service: Gastroenterology;  Laterality: N/A;   EUS N/A 10/13/2022   Procedure: UPPER ENDOSCOPIC ULTRASOUND (EUS) RADIAL;  Surgeon: Lemar Lofty., MD;  Location: [facility] ENDOSCOPY;  Service: Gastroenterology;  Laterality: N/A;   FINGER SURGERY     right   FOOT SURGERY     right   HEMOSTASIS  CLIP PLACEMENT  07/11/2022   Procedure: HEMOSTASIS CLIP PLACEMENT;  Surgeon: Meridee Score Netty Starring., MD;  Location: [facility] ENDOSCOPY;  Service: Gastroenterology;;  ovesco    HEMOSTASIS CLIP PLACEMENT  10/13/2022   Procedure: HEMOSTASIS CLIP PLACEMENT;  Surgeon: Lemar Lofty., MD;  Location: [facility] ENDOSCOPY;  Service: Gastroenterology;;   HOT HEMOSTASIS N/A 07/11/2022   Procedure: HOT HEMOSTASIS (ARGON PLASMA COAGULATION/BICAP);  Surgeon: Lemar Lofty., MD;  Location: [facility] ENDOSCOPY;  Service: Gastroenterology;  Laterality: N/A;   HOT HEMOSTASIS N/A 10/13/2022   Procedure: HOT HEMOSTASIS (ARGON PLASMA COAGULATION/BICAP);  Surgeon: Lemar Lofty., MD;  Location: [facility] ENDOSCOPY;  Service: Gastroenterology;  Laterality: N/A;   INGUINAL HERNIA REPAIR     right   IR FLUORO PROCEDURE UNLISTED  02/21/2023   IR GASTROSTOMY TUBE REMOVAL  02/10/2022   IR REPLACE G-TUBE SIMPLE WO FLUORO  03/14/2023   IR REPLC GASTRO/COLONIC TUBE PERCUT W/FLUORO  12/22/2022   IR REPLC GASTRO/COLONIC TUBE PERCUT W/FLUORO  01/26/2023   IR REPLC GASTRO/COLONIC TUBE PERCUT W/FLUORO  03/06/2023   IR REPLC GASTRO/COLONIC TUBE PERCUT W/FLUORO  04/27/2023   IRRIGATION AND DEBRIDEMENT ABSCESS N/A 06/04/2020   Procedure: IRRIGATION AND DEBRIDEMENT SACRAL WOUND;  Surgeon: Diamantina Monks, MD;  Location: [facility] OR;  Service: General;  Laterality: N/A;   LAPAROSCOPY N/A 06/04/2020   Procedure: LAPAROSCOPY ASSISTED COLOSTOMY, LAPAROSCOPY GASTROSTOMY;  Surgeon: Diamantina Monks, MD;  Location: [facility] OR;  Service: General;  Laterality: N/A;   LYSIS OF ADHESION N/A 06/04/2020   Procedure: LYSIS OF ADHESION;  Surgeon: Diamantina Monks, MD;  Location: [facility] OR;  Service: General;  Laterality: N/A;   melanoma removal     neck   ORTHOPEDIC SURGERY     foot right   POLYPECTOMY  10/13/2022   Procedure: POLYPECTOMY;  Surgeon: Mansouraty, Netty Starring., MD;  Location: [facility] ENDOSCOPY;  Service: Gastroenterology;;   rotator cuff surg     Bil   STENT  REMOVAL  10/13/2022   Procedure: CLIP REMOVAL;  Surgeon: Lemar Lofty., MD;  Location: [facility] ENDOSCOPY;  Service: Gastroenterology;;   SUBMUCOSAL LIFTING INJECTION  10/13/2022   Procedure: SUBMUCOSAL LIFTING INJECTION;  Surgeon: Lemar Lofty., MD;  Location: [facility] ENDOSCOPY;  Service: Gastroenterology;;   SUBMUCOSAL TATTOO INJECTION  10/13/2022   Procedure: SUBMUCOSAL TATTOO INJECTION;  Surgeon: Lemar Lofty., MD;  Location: [facility] ENDOSCOPY;  Service: Gastroenterology;;     A IV Location/Drains/Wounds Patient Lines/Drains/Airways Status     Active Line/Drains/Airways     Name Placement date Placement time Site Days   Peripheral IV 05/21/23 20 G Anterior;Proximal;Right Forearm 05/21/23  1352  Forearm  less than 1   Gastrostomy/Enterostomy Gastrostomy 30 Fr. LUQ 04/27/23  1258  LUQ  24   Colostomy LLQ --  --  LLQ  --   Suprapubic Catheter Latex 20 Fr. 02/22/23  1550  Latex  88   Pressure Injury 02/22/23 02/22/23  0800  -- 88            Intake/Output Last 24 hours No intake or output data in the 24 hours ending 05/21/23 1531  Labs/Imaging Results for orders placed or performed during the hospital encounter of 05/21/23 (from the past 48 hour(s))  CBC with Differential     Status: Abnormal   Collection Time: 05/21/23  1:47 PM  Result Value Ref Range   WBC 7.9 4.0 - 10.5 K/uL   RBC 4.23 4.22 - 5.81 MIL/uL   Hemoglobin 11.4 (L) 13.0 - 17.0 g/dL   HCT 16.1 (L) 09.6 - 04.5 %   MCV 88.2 80.0 - 100.0 fL   MCH 27.0 26.0 - 34.0 pg   MCHC 30.6 30.0 - 36.0 g/dL   RDW 40.9 (H) 81.1 - 91.4 %   Platelets 293 150 - 400 K/uL   nRBC 0.0 0.0 - 0.2 %   Neutrophils Relative % 64 %   Neutro Abs 4.9 1.7 - 7.7 K/uL   Lymphocytes Relative 21 %   Lymphs Abs 1.6 0.7 - 4.0 K/uL   Monocytes Relative 10 %   Monocytes Absolute 0.8 0.1 - 1.0 K/uL   Eosinophils Relative 5 %   Eosinophils Absolute 0.4 0.0 - 0.5 K/uL   Basophils Relative 0 %   Basophils Absolute 0.0 0.0 - 0.1 K/uL    Immature Granulocytes 0 %   Abs Immature Granulocytes 0.03 0.00 - 0.07 K/uL    Comment: Performed at [facility], [address] [place], [place] [address]  Comprehensive metabolic panel     Status: Abnormal   Collection Time: 05/21/23  1:47 PM  Result Value Ref Range   Sodium 139 135 - 145 mmol/L   Potassium 3.8 3.5 - 5.1 mmol/L   Chloride 105 98 - 111 mmol/L   CO2 23 22 - 32 mmol/L   Glucose, Bld 91 70 - 99 mg/dL    Comment: Glucose reference range applies only to samples taken after fasting for at least 8 hours.   BUN 11 8 - 23 mg/dL   Creatinine, Ser 6.21 (L) 0.61 - 1.24 mg/dL   Calcium 8.8 (L) 8.9 - 10.3 mg/dL   Total Protein 6.0 (L) 6.5 - 8.1 g/dL   Albumin 3.0 (L) 3.5 - 5.0 g/dL   AST 33 15 - 41 U/L   ALT 28 0 - 44 U/L   Alkaline Phosphatase 72 38 - 126 U/L   Total Bilirubin 0.4 0.3 - 1.2 mg/dL   GFR, Estimated >30 >86 mL/min    Comment: (NOTE) Calculated using the CKD-EPI Creatinine Equation (2021)    Anion gap 11 5 - 15    Comment: Performed at [facility], [address] [place], [place] [address]  Lactic acid, plasma     Status: None   Collection Time: 05/21/23  1:47 PM  Result Value Ref Range   Lactic Acid, Venous 1.2 0.5 - 1.9 mmol/L    Comment: Performed at [facility], [address], [place], [place] [address]  Lipase, blood     Status: None   Collection Time: 05/21/23  1:47 PM  Result Value Ref Range   Lipase 20 11 - 51 U/L    Comment: Performed at [facility], [address], [place], [place] [address]   No results found.  Pending Labs Unresulted Labs (From admission, onward)     Start     Ordered   05/21/23 1303  Lactic acid, plasma  (Lactic Acid)  Now then every 2 hours,   R (with STAT occurrences)      05/21/23 1308   Signed and Held  Comprehensive metabolic  panel  Tomorrow morning,   R        Signed and Held   Signed and Held  CBC  Tomorrow morning,   R        Signed and Held             Vitals/Pain Today's Vitals   05/21/23 1146 05/21/23 1147 05/21/23 1454  BP: (!) 166/60  (!) 156/84  Pulse: (!) 56  78  Resp: 16  16  Temp: 97.8 F (36.6 C)    TempSrc: Oral    SpO2: 98%  98%  Weight:  97 kg   Height:  6' (1.829 m)   PainSc:  0-No pain     Isolation Precautions No active isolations  Medications Medications  iohexol (OMNIPAQUE) 350 MG/ML injection 75 mL (75 mLs Intravenous Contrast Given 05/21/23 1524)  iohexol (OMNIPAQUE) 9 MG/ML oral solution 500 mL (500 mLs Oral Contrast Given 05/21/23 1526)    Mobility non-ambulatory     Focused Assessments Cardiac Assessment Handoff:    Lab Results  Component Value Date   CKTOTAL 119 03/15/2020   CKMB 1.9 02/14/2011   TROPONINI  02/14/2011    <0.30        Due to the release kinetics of cTnI, a negative result within the first hours of the onset of symptoms does not rule out myocardial infarction with certainty. If myocardial infarction is still suspected, repeat the test at appropriate intervals. **Please note change in reference range.**   Lab Results  Component Value Date   DDIMER 0.74 (H) 11/01/2021   Does the Patient currently have chest pain? No    R Recommendations: See Admitting Provider Note  Report given to:   Additional Notes:

## 2023-05-21 NOTE — ED Triage Notes (Signed)
Pt to the ed from [place] nursing facility. Pt had G tube displaced 3 days ago. Per facility it was a size  30. Pt has no pain at this time.

## 2023-05-21 NOTE — Significant Event (Addendum)
Pt requesting eye drops for eye pain.  Eye pain ongoing for a couple of months, usually just takes home eye drops for it apparently.  I ordered his home systane.  On further review however: this patient has h/o quadriplegia from transverse myelitis in setting of NMO.  No known history of optic neuritis as per his neurologist note from last month.  Per Dr. Amada Jupiter: optic neuritis unlikely if pain ongoing for several months and no vision changes (optic neuritis in NMO is typically abrupt onset and severe vision changes).  Will go do swinging light test to make sure no afferent pupillary defect.  No obvious defect on my exam.  Pt does follow with Dr. Epimenio Foot.  Has also seen an ophthalmologist since after eye pain issues have started, ophthalmologist was apparently less impressed as well.  I offered (ordered actually) MRI brain and orbits to make sure no optic neuritis, but pt declined this.

## 2023-05-22 ENCOUNTER — Other Ambulatory Visit (HOSPITAL_COMMUNITY): Payer: Self-pay | Admitting: Physician Assistant

## 2023-05-22 DIAGNOSIS — K316 Fistula of stomach and duodenum: Secondary | ICD-10-CM

## 2023-05-22 LAB — COMPREHENSIVE METABOLIC PANEL
ALT: 24 U/L (ref 0–44)
AST: 27 U/L (ref 15–41)
Albumin: 2.9 g/dL — ABNORMAL LOW (ref 3.5–5.0)
Alkaline Phosphatase: 66 U/L (ref 38–126)
Anion gap: 9 (ref 5–15)
BUN: 9 mg/dL (ref 8–23)
CO2: 22 mmol/L (ref 22–32)
Calcium: 8.6 mg/dL — ABNORMAL LOW (ref 8.9–10.3)
Chloride: 105 mmol/L (ref 98–111)
Creatinine, Ser: 0.57 mg/dL — ABNORMAL LOW (ref 0.61–1.24)
GFR, Estimated: >60 mL/min (ref >60)
Glucose, Bld: 160 mg/dL — ABNORMAL HIGH (ref 70–99)
Potassium: 3.3 mmol/L — ABNORMAL LOW (ref 3.5–5.1)
Sodium: 136 mmol/L (ref 135–145)
Total Bilirubin: 0.7 mg/dL (ref 0.3–1.2)
Total Protein: 5.9 g/dL — ABNORMAL LOW (ref 6.5–8.1)

## 2023-05-22 LAB — PREALBUMIN: Prealbumin: 19 mg/dL (ref 18–38)

## 2023-05-22 LAB — GLUCOSE, CAPILLARY
Glucose-Capillary: 164 mg/dL — ABNORMAL HIGH (ref 70–99)
Glucose-Capillary: 167 mg/dL — ABNORMAL HIGH (ref 70–99)
Glucose-Capillary: 167 mg/dL — ABNORMAL HIGH (ref 70–99)
Glucose-Capillary: 176 mg/dL — ABNORMAL HIGH (ref 70–99)
Glucose-Capillary: 182 mg/dL — ABNORMAL HIGH (ref 70–99)
Glucose-Capillary: 230 mg/dL — ABNORMAL HIGH (ref 70–99)

## 2023-05-22 LAB — CBC
HCT: 35.6 % — ABNORMAL LOW (ref 39.0–52.0)
Hemoglobin: 11.3 g/dL — ABNORMAL LOW (ref 13.0–17.0)
MCH: 27.6 pg (ref 26.0–34.0)
MCHC: 31.7 g/dL (ref 30.0–36.0)
MCV: 87 fL (ref 80.0–100.0)
Platelets: 296 10*3/uL (ref 150–400)
RBC: 4.09 MIL/uL — ABNORMAL LOW (ref 4.22–5.81)
RDW: 15.9 % — ABNORMAL HIGH (ref 11.5–15.5)
WBC: 7.7 10*3/uL (ref 4.0–10.5)
nRBC: 0 % (ref 0.0–0.2)

## 2023-05-22 LAB — PHOSPHORUS: Phosphorus: 3.7 mg/dL (ref 2.5–4.6)

## 2023-05-22 LAB — HEMOGLOBIN A1C
Hgb A1c MFr Bld: 8 % — ABNORMAL HIGH (ref 4.8–5.6)
Mean Plasma Glucose: 182.9 mg/dL

## 2023-05-22 LAB — TRIGLYCERIDES: Triglycerides: 377 mg/dL — ABNORMAL HIGH (ref <150)

## 2023-05-22 MED ORDER — SODIUM CHLORIDE 0.9% FLUSH: 10.0000 mL | INTRAVENOUS | Status: DC | PRN

## 2023-05-22 MED ORDER — DEXTROSE-SODIUM CHLORIDE 5-0.9 % IV SOLN: INTRAVENOUS | Status: DC

## 2023-05-22 MED ORDER — INSULIN ASPART 100 UNIT/ML IJ SOLN
0.0000 [IU] | INTRAMUSCULAR | Status: DC
  Administered 2023-05-22: 7 [IU] via SUBCUTANEOUS
  Administered 2023-05-22: 4 [IU] via SUBCUTANEOUS
  Administered 2023-05-23 (×4): 7 [IU] via SUBCUTANEOUS
  Administered 2023-05-23 (×2): 11 [IU] via SUBCUTANEOUS
  Administered 2023-05-24 (×2): 7 [IU] via SUBCUTANEOUS
  Administered 2023-05-24 (×3): 11 [IU] via SUBCUTANEOUS
  Administered 2023-05-24: 7 [IU] via SUBCUTANEOUS
  Administered 2023-05-25 (×4): 11 [IU] via SUBCUTANEOUS
  Administered 2023-05-25 (×2): 7 [IU] via SUBCUTANEOUS
  Administered 2023-05-26 (×6): 11 [IU] via SUBCUTANEOUS
  Administered 2023-05-27: 15 [IU] via SUBCUTANEOUS
  Administered 2023-05-27 (×2): 7 [IU] via SUBCUTANEOUS
  Administered 2023-05-27: 11 [IU] via SUBCUTANEOUS
  Administered 2023-05-27: 15 [IU] via SUBCUTANEOUS
  Administered 2023-05-27 – 2023-05-28 (×4): 11 [IU] via SUBCUTANEOUS
  Administered 2023-05-28: 7 [IU] via SUBCUTANEOUS
  Administered 2023-05-28: 15 [IU] via SUBCUTANEOUS
  Administered 2023-05-28: 11 [IU] via SUBCUTANEOUS
  Administered 2023-05-29: 15 [IU] via SUBCUTANEOUS
  Administered 2023-05-29: 7 [IU] via SUBCUTANEOUS
  Administered 2023-05-29: 11 [IU] via SUBCUTANEOUS
  Administered 2023-05-29: 7 [IU] via SUBCUTANEOUS
  Administered 2023-05-29: 15 [IU] via SUBCUTANEOUS
  Administered 2023-05-30: 4 [IU] via SUBCUTANEOUS
  Administered 2023-05-30: 15 [IU] via SUBCUTANEOUS

## 2023-05-22 MED ORDER — GERHARDT'S BUTT CREAM
TOPICAL_CREAM | Freq: Three times a day (TID) | CUTANEOUS | Status: DC
  Filled 2023-05-22 (×3): qty 1

## 2023-05-22 MED ORDER — CHLORHEXIDINE GLUCONATE CLOTH 2 % EX PADS
6.0000 | MEDICATED_PAD | Freq: Every day | CUTANEOUS | Status: DC
  Administered 2023-05-22 – 2023-05-31 (×10): 6 via TOPICAL

## 2023-05-22 MED ORDER — POTASSIUM CHLORIDE 10 MEQ/100ML IV SOLN
10.0000 meq | INTRAVENOUS | Status: AC
  Administered 2023-05-22 (×4): 10 meq via INTRAVENOUS
  Filled 2023-05-22 (×4): qty 100

## 2023-05-22 MED ORDER — TRAVASOL 10 % IV SOLN
INTRAVENOUS | Status: AC
  Filled 2023-05-22: qty 577.9

## 2023-05-22 MED ORDER — SODIUM CHLORIDE 0.9% FLUSH
10.0000 mL | Freq: Two times a day (BID) | INTRAVENOUS | Status: DC
  Administered 2023-05-22 – 2023-05-31 (×16): 10 mL

## 2023-05-22 MED ORDER — MEDIHONEY WOUND/BURN DRESSING EX PSTE
1.0000 | PASTE | Freq: Every day | CUTANEOUS | Status: DC
  Administered 2023-05-23 – 2023-05-31 (×9): 1 via TOPICAL
  Filled 2023-05-22: qty 44

## 2023-05-22 NOTE — Progress Notes (Signed)
[facility] Progress Note     Subjective: CC-  No complaints this morning. Pouch to GC fistula with good seal. 520cc output recorded some far, more fluid in pouch.  Objective: Vital signs in last 24 hours: Temp:  [97.7 F (36.5 C)-98 F (36.7 C)] 97.8 F (36.6 C) (09/02 0557) Pulse Rate:  [56-78] 62 (09/02 0741) Resp:  [16-18] 18 (09/02 0557) BP: (156-183)/(54-84) 156/54 (09/02 0741) SpO2:  [96 %-98 %] 98 % (09/02 0741) Weight:  [97 kg] 97 kg (09/02 0654) Last BM Date : 05/21/23  Intake/Output from previous day: 09/01 0701 - 09/02 0700 In: 898.1 [I.V.:898.1] Out: 2090 [Urine:1150; Drains:520; Stool:420] Intake/Output this shift: No intake/output data recorded.  PE: Gen:  Alert, NAD, pleasant Abd: obese, soft, nontender, ostomy viable with new/clean pouch in place, LUQ GC fistula with pouch in place/ good seal and thin gastric contents in pouch  Lab Results:  Recent Labs    05/21/23 1347 05/22/23 0331  WBC 7.9 7.7  HGB 11.4* 11.3*  HCT 37.3* 35.6*  PLT 293 296   BMET Recent Labs    05/21/23 1347 05/22/23 0331  NA 139 136  K 3.8 3.3*  CL 105 105  CO2 23 22  GLUCOSE 91 160*  BUN 11 9  CREATININE 0.59* 0.57*  CALCIUM 8.8* 8.6*   PT/INR No results for input(s): "LABPROT", "INR" in the last 72 hours. CMP     Component Value Date/Time   NA 136 05/22/2023 0331   NA 140 04/11/2019 1028   K 3.3 (L) 05/22/2023 0331   CL 105 05/22/2023 0331   CO2 22 05/22/2023 0331   GLUCOSE 160 (H) 05/22/2023 0331   BUN 9 05/22/2023 0331   BUN 21 04/11/2019 1028   CREATININE 0.57 (L) 05/22/2023 0331   CREATININE 0.26 (L) 07/23/2020 1052   CALCIUM 8.6 (L) 05/22/2023 0331   PROT 5.9 (L) 05/22/2023 0331   PROT 6.6 01/20/2020 0754   ALBUMIN 2.9 (L) 05/22/2023 0331   ALBUMIN 4.2 03/10/2020 1757   AST 27 05/22/2023 0331   ALT 24 05/22/2023 0331   ALKPHOS 66 05/22/2023 0331   BILITOT 0.7 05/22/2023 0331   BILITOT 0.3 01/20/2020 0754   GFRNONAA >60 05/22/2023  0331   GFRNONAA 87 04/15/2016 1133   GFRAA >60 06/10/2020 0431   GFRAA >89 04/15/2016 1133   Lipase     Component Value Date/Time   LIPASE 20 05/21/2023 1347       Studies/Results: CT ABDOMEN PELVIS W CONTRAST  Result Date: 05/21/2023 CLINICAL DATA:  Abdominal pain near fistula/gastric tube site. EXAM: CT ABDOMEN AND PELVIS WITH CONTRAST TECHNIQUE: Multidetector CT imaging of the abdomen and pelvis was performed using the standard protocol following bolus administration of intravenous contrast. RADIATION DOSE REDUCTION: This exam was performed according to the departmental dose-optimization program which includes automated exposure control, adjustment of the mA and/or kV according to patient size and/or use of iterative reconstruction technique. CONTRAST:  75mL OMNIPAQUE IOHEXOL 350 MG/ML SOLN COMPARISON:  CT abdomen and pelvis dated 12/08/2022 and CT abdomen and pelvis dated 03/25/2022. FINDINGS: Lower chest: Trace bilateral pleural effusions with associated atelectasis are partially imaged. Hepatobiliary: No focal liver abnormality is seen. Status post cholecystectomy. No biliary dilatation. Pancreas: Unremarkable. No pancreatic ductal dilatation or surrounding inflammatory changes. Spleen: Normal in size without focal abnormality. Adrenals/Urinary Tract: Adrenal glands are unremarkable. Hyperdensity measuring 3.0 cm in the left kidney likely reflects retained contrast. A left renal cyst measures 3.6 cm. No imaging follow-up is recommended for  this finding. No renal calculi or hydronephrosis on either side. A suprapubic catheter is noted and the urinary bladder is deflated. Stomach/Bowel: A gastro-cutaneous fistula along the anterior aspect of the gastric body is redemonstrated. No significant associated fat stranding or inflammatory changes to suggest infection. No evidence of intraperitoneal leakage or abscess formation. The patient is status post a left lower quadrant loop colostomy. Appendix  appears normal. There is colonic diverticulosis without evidence of diverticulitis. No evidence of bowel wall thickening, distention, or inflammatory changes. Vascular/Lymphatic: Aortic atherosclerosis. No enlarged abdominal or pelvic lymph nodes. Reproductive: Prostate is unremarkable. Other: No abdominal wall hernia or abnormality. No abdominopelvic ascites. Musculoskeletal: Degenerative changes are seen in the spine. Chronic decubitus ulcer involving the right ischium as well as chronic deformity of the sacrum and coccyx appear similar to prior exam. IMPRESSION: 1. Redemonstration of a gastro-cutaneous fistula without evidence of intraperitoneal leakage or associated inflammatory changes. 2. Trace bilateral pleural effusions with associated atelectasis. Aortic Atherosclerosis (ICD10-I70.0). Electronically Signed   By: Romona Curls M.D.   On: 05/21/2023 16:21    Anti-infectives: Anti-infectives (From admission, onward)    None        Assessment/Plan GC Fistula - failed multiple minimally invasive attempts to treat this - continue NPO. Pouch to GC fistula to quantify output.  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 Epimenio Foot of [facility] IDDM HTN HLD Neurogenic bladder with SP tube  I reviewed hospitalist notes, last 24 h vitals and pain scores, last 48 h intake and output, and last 24 h labs and trends.    LOS: 1 day    Franne Forts, [facility] 05/22/2023, 9:16 AM Please see Amion for pager number during day hours 7:00am-4:30pm

## 2023-05-22 NOTE — Plan of Care (Signed)

## 2023-05-22 NOTE — Progress Notes (Signed)
PROGRESS NOTE  Brent [name]. WUJ:[id number] DOB: 19-Aug-1946 DOA: 05/21/2023 PCP: [name], MD   LOS: 1 day   Brief Narrative / Interim history:  77 y.o. 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  G-tube was initially placed in 2021 when he got diagnosed with his neurological issues, was removed in May 2023 but the gastrocutaneous fistula has failed to heal since then, several attempts to close have been attempted in 2023 and 2024.  His fistula has grown larger and he decided to come to the hospital. General surgery consulted.   Subjective / 24h Interval events: Doing well this morning. No complaints  Assesement and Plan: Principal problem Gastrocutaneous fistula-surgery consulted, will discuss with the surgical team regarding alternative feeding options, either a postpyloric [place] tube versus TPN via PICC line.  CT scan of the abdomen pelvis yesterday did not show any intraperitoneal leakage or associated inflammatory changes around the gastrocutaneous fistula   Active problems Insulin-dependent diabetes mellitus -continue current insulin regimen  Lab Results  Component Value Date   HGBA1C 8.0 (H) 05/22/2023   CBG (last 3)  Recent Labs    05/22/23 0049 05/22/23 0554 05/22/23 0741  GLUCAP 167* 164* 176*    Essential hypertension-continue home medications   Hyperlipidemia-continue statin   Stage IV sacral ulcer, chronic, POA-wound care consult   Neurogenic bladder, with suprapubic catheter -monitor, currently afebrile, no leukocytosis, do not suspect UTI   Depression-continue home medications   Functional quadriplegia secondary to transverse myelitis - noted  Scheduled Meds:  atorvastatin  40 mg Oral QHS   baclofen  20 mg Oral TID   DULoxetine  30 mg  Oral QHS   enoxaparin (LOVENOX) injection  40 mg Subcutaneous Q24H   famotidine  20 mg Oral BID   insulin aspart  0-9 Units Subcutaneous Q6H   insulin detemir  5 Units Subcutaneous QHS   leptospermum manuka honey  1 Application Topical Daily   losartan  75 mg Oral Daily   pantoprazole  40 mg Oral Daily   traZODone  75 mg Oral QHS   Continuous Infusions:  dextrose 5 % and 0.9 % NaCl 75 mL/hr at 05/22/23 0611   PRN Meds:.acetaminophen **OR** acetaminophen, hydrALAZINE, LORazepam, ondansetron **OR** ondansetron (ZOFRAN) IV, polyvinyl alcohol, traMADol  Current Outpatient Medications  Medication Instructions   Alogliptin Benzoate 25 mg, Oral, Daily   Amino Acids-Protein Hydrolys (FEEDING SUPPLEMENT, PRO-STAT 64,) LIQD 30 mLs, Oral, 2 times daily   ascorbic acid (VITAMIN C) 500 mg, Oral, Daily   atenolol (TENORMIN) 12.5-25 mg, Oral, See admin instructions, Take 12.5 mg (1/2 tablet) by mouth once daily in the morning and 25 mg (1 tablet) at bedtime.    atorvastatin (LIPITOR) 20 mg, Oral, Daily   B COMPLEX VITAMINS PO 1 capsule, Oral, Daily   baclofen (LIORESAL) 20 mg, Oral, 3 times daily   chlorhexidine (HIBICLENS) 4 % external liquid 1 Application, Topical, Every other day   cholecalciferol (VITAMIN D3) 1,000 Units, Oral, Daily   DULoxetine (CYMBALTA) 30 mg, Oral, Nightly   famotidine (PEPCID) 20 mg, Oral, 2 times daily   fexofenadine (ALLEGRA) 180 mg, Oral, Daily   Fish Oil 1,000 mg, Oral, 2 times daily   fluticasone (FLONASE) 50 MCG/ACT nasal spray 2 sprays, Each Nare, Daily   insulin aspart (NOVOLOG) 0-15 Units, Subcutaneous, Every 4 hours, Sliding scale  CBG 70 -  120: 0 units: CBG 121 - 150: 2 units; CBG 151 - 200: 3 units; CBG 201 - 250: 5 units; CBG 251 - 300: 8 units;CBG 301 - 350: 11 units; CBG 351 - 400: 15 units; CBG > 400 : 15 units and notify MD   insulin glargine (LANTUS) 15 Units, Subcutaneous, Daily at bedtime   ketoconazole (NIZORAL) 2 % cream 1 Application, Topical, 2  times daily   lamoTRIgine (LAMICTAL) 50 mg, Oral, 2 times daily, For 5 days. Started 02/18/23.    LORazepam (ATIVAN) 0.5 mg, Oral, Daily   losartan (COZAAR) 75 mg, Oral, Daily   Magnesium Oxide (Elemental) 400 mg, Oral, 2 times daily   melatonin 3 mg, Oral, Daily at bedtime   Menthol, Topical Analgesic, (BIOFREEZE) 4 % GEL 1 application , Topical, 2 times daily PRN   metFORMIN (GLUCOPHAGE) 1,000 mg, Oral, 2 times daily with meals   nystatin powder 1 Application, Topical, Daily, Apply to peri-wound bed to sacrum and right ischium daily with routine dressing.    omeprazole (PRILOSEC) 40 mg, Oral, 2 times daily, 30 minutes before breakfast/dinner   ondansetron (ZOFRAN) 4 mg, Oral, Every 8 hours PRN   oxyCODONE (OXY IR/ROXICODONE) 5 mg, Oral, 2 times daily PRN   Polyethyl Glycol-Propyl Glycol (GENTEAL TEARS SEVERE DAY/NIGHT) 0.4-0.3 % GEL ophthalmic gel 1 Application, Both Eyes, 4 times daily PRN   PSYLLIUM PO 0.8 g, Oral, 2 times daily   senna-docusate (SENOKOT-S) 8.6-50 MG tablet 1 tablet, Oral, 2 times daily   spironolactone (ALDACTONE) 25 mg, Oral, Daily   tiZANidine (ZANAFLEX) 2 mg, Oral, Daily at bedtime   traZODone (DESYREL) 75 mg, Oral, Daily at bedtime    Diet Orders (From admission, onward)     Start     Ordered   05/21/23 2150  Diet NPO time specified Except for: Sips with Meds  Diet effective now       Question:  Except for  Answer:  Sips with Meds   05/21/23 2149            DVT prophylaxis: enoxaparin (LOVENOX) injection 40 mg Start: 05/21/23 2000   Lab Results  Component Value Date   PLT 296 05/22/2023      Code Status: Full Code  Family Communication: no family at bedside   Status is: Inpatient Remains inpatient appropriate because: severity of illness  Level of care: Med-Surg  Consultants:  General surgery   Objective: Vitals:   05/22/23 0048 05/22/23 0557 05/22/23 0654 05/22/23 0741  BP: (!) 157/58 (!) 158/58  (!) 156/54  Pulse: (!) 59 61  62   Resp: 18 18    Temp: 97.8 F (36.6 C) 97.8 F (36.6 C)    TempSrc: Oral Oral    SpO2: 96% 98%  98%  Weight:   97 kg   Height:        Intake/Output Summary (Last 24 hours) at 05/22/2023 0954 Last data filed at 05/22/2023 0654 Gross per 24 hour  Intake 898.14 ml  Output 2090 ml  Net -1191.86 ml   Wt Readings from Last 3 Encounters:  05/22/23 97 kg  10/13/22 97.5 kg  07/11/22 93 kg    Examination:  Constitutional: NAD Eyes: no scleral icterus ENMT: Mucous membranes are moist.  Neck: normal, supple Respiratory: clear to auscultation bilaterally, no wheezing, no crackles.  Cardiovascular: Regular rate and rhythm, no murmurs / rubs / gallops.  Abdomen: non distended, no tenderness. Bowel sounds positive.  Musculoskeletal: no clubbing / cyanosis.   Data Reviewed:  I have independently reviewed following labs and imaging studies   CBC Recent Labs  Lab 05/21/23 1347 05/22/23 0331  WBC 7.9 7.7  HGB 11.4* 11.3*  HCT 37.3* 35.6*  PLT 293 296  MCV 88.2 87.0  MCH 27.0 27.6  MCHC 30.6 31.7  RDW 15.9* 15.9*  LYMPHSABS 1.6  --   MONOABS 0.8  --   EOSABS 0.4  --   BASOSABS 0.0  --     Recent Labs  Lab 05/21/23 1347 05/21/23 1737 05/22/23 0331 05/22/23 0344  NA 139  --  136  --   K 3.8  --  3.3*  --   CL 105  --  105  --   CO2 23  --  22  --   GLUCOSE 91  --  160*  --   BUN 11  --  9  --   CREATININE 0.59*  --  0.57*  --   CALCIUM 8.8*  --  8.6*  --   AST 33  --  27  --   ALT 28  --  24  --   ALKPHOS 72  --  66  --   BILITOT 0.4  --  0.7  --   ALBUMIN 3.0*  --  2.9*  --   LATICACIDVEN 1.2 1.5  --   --   HGBA1C  --   --   --  8.0*    ------------------------------------------------------------------------------------------------------------------ No results for input(s): "CHOL", "HDL", "LDLCALC", "TRIG", "CHOLHDL", "LDLDIRECT" in the last 72 hours.  Lab Results  Component Value Date   HGBA1C 8.0 (H) 05/22/2023    ------------------------------------------------------------------------------------------------------------------ No results for input(s): "TSH", "T4TOTAL", "T3FREE", "THYROIDAB" in the last 72 hours.  Invalid input(s): "FREET3"  Cardiac Enzymes No results for input(s): "CKMB", "TROPONINI", "MYOGLOBIN" in the last 168 hours.  Invalid input(s): "CK" ------------------------------------------------------------------------------------------------------------------    Component Value Date/Time   BNP 77.0 02/23/2023 0717    CBG: Recent Labs  Lab 05/21/23 1732 05/21/23 2029 05/22/23 0049 05/22/23 0554 05/22/23 0741  GLUCAP 118* 158* 167* 164* 176*    No results found for this or any previous visit (from the past 240 hour(s)).   Radiology Studies: [place] EKG SITE RITE  Result Date: 05/22/2023 If Site Rite image not attached, placement could not be confirmed due to current cardiac rhythm.  CT ABDOMEN PELVIS W CONTRAST  Result Date: 05/21/2023 CLINICAL DATA:  Abdominal pain near fistula/gastric tube site. EXAM: CT ABDOMEN AND PELVIS WITH CONTRAST TECHNIQUE: Multidetector CT imaging of the abdomen and pelvis was performed using the standard protocol following bolus administration of intravenous contrast. RADIATION DOSE REDUCTION: This exam was performed according to the departmental dose-optimization program which includes automated exposure control, adjustment of the mA and/or kV according to patient size and/or use of iterative reconstruction technique. CONTRAST:  75mL OMNIPAQUE IOHEXOL 350 MG/ML SOLN COMPARISON:  CT abdomen and pelvis dated 12/08/2022 and CT abdomen and pelvis dated 03/25/2022. FINDINGS: Lower chest: Trace bilateral pleural effusions with associated atelectasis are partially imaged. Hepatobiliary: No focal liver abnormality is seen. Status post cholecystectomy. No biliary dilatation. Pancreas: Unremarkable. No pancreatic ductal dilatation or surrounding inflammatory  changes. Spleen: Normal in size without focal abnormality. Adrenals/Urinary Tract: Adrenal glands are unremarkable. Hyperdensity measuring 3.0 cm in the left kidney likely reflects retained contrast. A left renal cyst measures 3.6 cm. No imaging follow-up is recommended for this finding. No renal calculi or hydronephrosis on either side. A suprapubic catheter is noted and the urinary bladder is deflated. Stomach/Bowel: A  gastro-cutaneous fistula along the anterior aspect of the gastric body is redemonstrated. No significant associated fat stranding or inflammatory changes to suggest infection. No evidence of intraperitoneal leakage or abscess formation. The patient is status post a left lower quadrant loop colostomy. Appendix appears normal. There is colonic diverticulosis without evidence of diverticulitis. No evidence of bowel wall thickening, distention, or inflammatory changes. Vascular/Lymphatic: Aortic atherosclerosis. No enlarged abdominal or pelvic lymph nodes. Reproductive: Prostate is unremarkable. Other: No abdominal wall hernia or abnormality. No abdominopelvic ascites. Musculoskeletal: Degenerative changes are seen in the spine. Chronic decubitus ulcer involving the right ischium as well as chronic deformity of the sacrum and coccyx appear similar to prior exam. IMPRESSION: 1. Redemonstration of a gastro-cutaneous fistula without evidence of intraperitoneal leakage or associated inflammatory changes. 2. Trace bilateral pleural effusions with associated atelectasis. Aortic Atherosclerosis (ICD10-I70.0). Electronically Signed   By: Romona Curls M.D.   On: 05/21/2023 16:21     Pamella Pert, MD, PhD [facility]  Between 7 am - 7 pm I am available, please contact me via Amion (for emergencies) or Securechat (non urgent messages)  Between 7 pm - 7 am I am not available, please contact night coverage MD/APP via Amion

## 2023-05-22 NOTE — Consult Note (Addendum)
WOC Nurse Consult Note: patient familiar to WOC team from previous admissions  Reason for Consult: colostomy/fistula/sacral wounds  Wound type: 1.  Healing Stage 4 R ischium  2.  Healing Stage 4 sacrum  3.  Full thickness ulcer R foot ? Diabetic ulcer  Pressure Injury POA: Yes Measurement: 1.  Healing stage 4 R ischium 2 cm x 2 cm with 1 cm x 0.3 cm x 0.2 cm open area 100% pink and moist  2.  Entire sacrum/buttocks/coccyx area deep purple discoloration with shaggy appearing epithelium 3.  R plantar foot (beneath 5th digit) full thickness ulcer 100% black eschar   Drainage (amount, consistency, odor) minimal serosanguinous to R ischium and sacrum; dry R plantar foot  Periwound: mild erythema to R foot ulcer; sacrum/coccyx/ischium appearance as above  Dressing procedure/placement/frequency:  Clean R ischial wound with NS, apply Silver Hydrofiber [name] [phone]) cut to fit wound bed daily and cover with silicone foam or ABD pad.   Clean buttocks/sacrum/coccyx with soap and water, apply Gerhardt's Butt Cream 3 times daily and prn soiling.  May cover area with ABD pad.  Would leave silicone foam off.   Clean R plantar foot wound with NS, apply Medihoney to wound bed daily, cover with dry gauze and secure with Kerlix roll gauze.  May wrap both legs with Kerlix roll gauze beginning just above toes and ending right below knees.  Secure with Ace wrap in same fashion for light compression.    Patient ordered low air loss mattress for pressure redistribution and moisture management. Also ordered Prevalon boots to offload feet as bedbound.    POC discussed with patient and bedside nurse. WOC team will not follow at this time Re-consult if further needs arise.   WOC Nurse ostomy consult note Stoma type/location: LLQ colostomy  Stomal assessment/size: 1 3/4" round pink moist well budded  Peristomal assessment: mild erythema and denuded skin above stoma; otherwise intact  Treatment options for  stomal/peristomal skin: crusted skin above stoma with stoma powder and no sting barrier wipe, placed 2" barrier ring  Output approximately 50 mls brown soft stool  Ostomy pouching: 2 piece 2 3/4" skin barrier Lawson #2, 2 3/4" pouch [name] [phone] and 2" barrier ring [name] [phone] Education provided: none, patient resides at [facility] and he states they put whatever they have on it.  Patient has never been independent in care of ostomy.  Enrolled patient in [employer] DC program: no, established ostomy.   Ordered (4) sets of 2 3/4" skin barrier [name] #2), 2 3/4" pouches [name] [phone]) and 2" barrier rings [name] [phone])   WOC Nurse fistula consult note; patient with longstanding fistulous opening after feeding tube removed from area per patient  Fistula type/location: LUQ gastrocutaneous fistula  Perifistula assessment: moisture associated skin damage with erythema and denuded skin  Treatment options for perifistula skin: crusted area with stoma powder and no sting barrier wipe  Output thin yellow effluent in current rectal pouch  Fistula pouching: Placed in a small Eakin pouch [name] [phone]) Change Eakin pouch  weekly or more frequently if leakage noted.   If pouch leaks, use pattern in the room to cut new pouch opening.  Sprinkle ostomy powder over the irritated skin, tap over powder with damp wash cloth or skin barrier wipe (Cavilon No-Sting), brushing away excess powder to form a layer of "crust". Ok to apply 3 layers as described above.  Place barrier ring around open area (like a stoma) Place new Eakin pouch,  Write date on pouch  Supplies ordered for room at this visit:  2 small Eakin pouches [name] [phone])   WOC team will not follow at this time. Re-consult if further needs arise.   Thank you,    Priscella Mann MSN, RN-BC, [employer] [phone]

## 2023-05-22 NOTE — Progress Notes (Signed)
Peripherally Inserted Central Catheter Placement  The IV Nurse has discussed with the patient and/or persons authorized to consent for the patient, the purpose of this procedure and the potential benefits and risks involved with this procedure.  The benefits include less needle sticks, lab draws from the catheter, and the patient may be discharged home with the catheter. Risks include, but not limited to, infection, bleeding, blood clot (thrombus formation), and puncture of an artery; nerve damage and irregular heartbeat and possibility to perform a PICC exchange if needed/ordered by physician.  Alternatives to this procedure were also discussed.  Bard Power PICC patient education guide, fact sheet on infection prevention and patient information card has been provided to patient /or left at bedside.    PICC Placement Documentation  PICC Double Lumen 05/22/23 Right Brachial 41 cm 1 cm (Active)  Indication for Insertion or Continuance of Line Administration of hyperosmolar/irritating solutions (i.e. TPN, Vancomycin, etc.) 05/22/23 1034  Exposed Catheter (cm) 1 cm 05/22/23 1034  Site Assessment Clean, Dry, Intact 05/22/23 1034  Lumen #1 Status Flushed;Saline locked;Blood return noted 05/22/23 1034  Lumen #2 Status Flushed;Saline locked;Blood return noted 05/22/23 1034  Dressing Type Transparent;Securing device 05/22/23 1034  Dressing Status Antimicrobial disc in place;Clean, Dry, Intact 05/22/23 1034  Line Care Connections checked and tightened 05/22/23 1034  Line Adjustment (NICU/IV Team Only) No 05/22/23 1034  Dressing Intervention New dressing;Adhesive placed at insertion site (IV team only);Other (Comment) 05/22/23 1034  Dressing Change Due 05/29/23 05/22/23 1034       Annett Fabian 05/22/2023, 10:38 AM

## 2023-05-22 NOTE — Progress Notes (Signed)
Initial Nutrition Assessment  DOCUMENTATION CODES:   Not applicable  INTERVENTION:  - Start TPNI via PICC per pharmacy management.   NUTRITION DIAGNOSIS:   Inadequate oral intake related to inability to eat as evidenced by NPO status.  GOAL:   Provide needs based on ASPEN/SCCM guidelines  MONITOR:   I & O's, Labs  REASON FOR ASSESSMENT:   Consult New TPN/TNA  ASSESSMENT:   77 y.o. male admits related to gastrocutaneous fistula. 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  Meds reviewed: lipitor, pepcid, sliding scale insulin, cozaar. Labs reviewed: K low, K low.   MD consult to initiate new TPN. Pt with PICC line in place. Pt has a G-tube. General surgery is following to monitor fistula output. RD will continue to monitor POC. RD working remotely today but will evaluate in-person to gather nutrition hx details and NFPE. No significant wt loss per record.   NUTRITION - FOCUSED PHYSICAL EXAM:  Remote assessment.  Diet Order:   Diet Order             Diet NPO time specified Except for: Sips with Meds  Diet effective now                   EDUCATION NEEDS:   Not appropriate for education at this time  Skin:  Skin Assessment: Skin Integrity Issues: Skin Integrity Issues:: Stage III Stage III: bilateral buttocks  Last BM:     Height:   Ht Readings from Last 1 Encounters:  05/21/23 6' (1.829 m)    Weight:   Wt Readings from Last 1 Encounters:  05/22/23 97 kg    Ideal Body Weight:     BMI:  Body mass index is 29 kg/m.  Estimated Nutritional Needs:   Kcal:  2400-2700kcals  Protein:  120-130 gm  Fluid:  2.4-2.7 L  Bethann Humble, RD, LDN, CNSC.

## 2023-05-22 NOTE — Progress Notes (Addendum)
PHARMACY - TOTAL PARENTERAL NUTRITION CONSULT NOTE   Indication:  GC Fistula  Patient Measurements: Height: 6' (182.9 cm) Weight: 97 kg (213 lb 13.5 oz) IBW/kg (Calculated) : 77.6 TPN AdjBW (KG): 82.5 Body mass index is 29 kg/m. Usual Weight: 97kg on admission   Assessment:  Patient admitted with worsening of GC fistula. 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 Patient with Gtube placed in 2021 which was then removed in 2023 due to issues with healing. There have been several attempts to close GC fistula in 2023 and 2024. General surgery consulted NPO for now to evaluate fistula output. Depending on clinical course potential for surgical intervention while inpatient in addition to post-pyloric feeds. However per GS, will start TPN today.   Glucose / Insulin: A1C 8.0%, patient taking 42u of insulin glargine and SSI PTA, CBG: 182-91, levemir 5u at bedtime, 4u SSI/24h  Electrolytes: Na: 136, K: 3.3, CoCa: 9.5, phos: 3.7, all others WNL  Renal: Scr: 0.57, BUN: 9  Hepatic: T-bili, AST/ALT WNL, albumin: 2.9, pre-albumin: 19, TG: 377, alk phos: 66  Intake / Output; MIVF: D5/1/2NS @ 39mL/hr, UOP , 520 drain, 420 stool output  GI Imaging: 9/1 CT abdomen pelvis: GC fistula shown, trace pleural effusions  GI Surgeries / Procedures:  None   Central access: PICC to be placed 9/2  TPN start date: 9/2   Nutritional Goals: Goal TPN rate is 90 mL/hr (provides 130 g of protein and 2100kcal kcals per day)  RD Assessment: pending  PharmD assessment: approximately 2100kcal (~25kcal/kg AdjBW) and 130g protein (~ 1.5g/kg AdjBW)    Current Nutrition:  NPO  Plan:  Start TPN at half goal rate of 69mL/hr at 1800, will provide 58g AA and 933 kcal meeting approximately 50% of needs.  Empirically lowered lipid content to 20% of total Kcal content with baseline  TG elevated to 377. Based on RD recommendations on 9/3 consider adjusting protein requirement to higher end goal to lower dextrose load in TPN.  Electrolytes in TPN: Na 7mEq/L, K 58mEq/L, Ca 56mEq/L, Mg 68mEq/L, and Phos 74mmol/L. Cl:Ac 1:2 Add standard MVI and trace elements to TPN Initiate Resistant q4h SSI at TPN start time and adjust as needed D/C levemir 5 units outside of TPN, will add 5 units within TPN.  Reduce MIVF to 35 mL/hr at 1800 Monitor TPN labs on Mon/Thurs, daily until stabilization  F/u RD assessment for adjustments of goal protein and kcal based.  Will check TG tmrw AM to ensure tolerates.   Estill Batten, PharmD, BCCCP  05/22/2023,10:03 AM

## 2023-05-23 ENCOUNTER — Other Ambulatory Visit: Payer: Self-pay

## 2023-05-23 DIAGNOSIS — K316 Fistula of stomach and duodenum: Secondary | ICD-10-CM | POA: Diagnosis not present

## 2023-05-23 LAB — MAGNESIUM: Magnesium: 1.6 mg/dL — ABNORMAL LOW (ref 1.7–2.4)

## 2023-05-23 LAB — COMPREHENSIVE METABOLIC PANEL
ALT: 23 U/L (ref 0–44)
AST: 24 U/L (ref 15–41)
Albumin: 2.7 g/dL — ABNORMAL LOW (ref 3.5–5.0)
Alkaline Phosphatase: 63 U/L (ref 38–126)
Anion gap: 9 (ref 5–15)
BUN: 7 mg/dL — ABNORMAL LOW (ref 8–23)
CO2: 24 mmol/L (ref 22–32)
Calcium: 8.6 mg/dL — ABNORMAL LOW (ref 8.9–10.3)
Chloride: 104 mmol/L (ref 98–111)
Creatinine, Ser: 0.58 mg/dL — ABNORMAL LOW (ref 0.61–1.24)
GFR, Estimated: >60 mL/min (ref >60)
Glucose, Bld: 241 mg/dL — ABNORMAL HIGH (ref 70–99)
Potassium: 3.5 mmol/L (ref 3.5–5.1)
Sodium: 137 mmol/L (ref 135–145)
Total Bilirubin: 0.7 mg/dL (ref 0.3–1.2)
Total Protein: 5.5 g/dL — ABNORMAL LOW (ref 6.5–8.1)

## 2023-05-23 LAB — CBC WITH DIFFERENTIAL/PLATELET
Abs Immature Granulocytes: 0.02 10*3/uL (ref 0.00–0.07)
Basophils Absolute: 0 10*3/uL (ref 0.0–0.1)
Basophils Relative: 0 %
Eosinophils Absolute: 0.3 10*3/uL (ref 0.0–0.5)
Eosinophils Relative: 5 %
HCT: 35.5 % — ABNORMAL LOW (ref 39.0–52.0)
Hemoglobin: 11.1 g/dL — ABNORMAL LOW (ref 13.0–17.0)
Immature Granulocytes: 0 %
Lymphocytes Relative: 20 %
Lymphs Abs: 1.2 10*3/uL (ref 0.7–4.0)
MCH: 27.2 pg (ref 26.0–34.0)
MCHC: 31.3 g/dL (ref 30.0–36.0)
MCV: 87 fL (ref 80.0–100.0)
Monocytes Absolute: 0.6 10*3/uL (ref 0.1–1.0)
Monocytes Relative: 10 %
Neutro Abs: 3.7 10*3/uL (ref 1.7–7.7)
Neutrophils Relative %: 65 %
Platelets: 262 10*3/uL (ref 150–400)
RBC: 4.08 MIL/uL — ABNORMAL LOW (ref 4.22–5.81)
RDW: 15.6 % — ABNORMAL HIGH (ref 11.5–15.5)
WBC: 5.8 10*3/uL (ref 4.0–10.5)
nRBC: 0 % (ref 0.0–0.2)

## 2023-05-23 LAB — BASIC METABOLIC PANEL
Anion gap: 11 (ref 5–15)
BUN: 7 mg/dL — ABNORMAL LOW (ref 8–23)
CO2: 22 mmol/L (ref 22–32)
Calcium: 8.8 mg/dL — ABNORMAL LOW (ref 8.9–10.3)
Chloride: 103 mmol/L (ref 98–111)
Creatinine, Ser: 0.56 mg/dL — ABNORMAL LOW (ref 0.61–1.24)
GFR, Estimated: >60 mL/min (ref >60)
Glucose, Bld: 261 mg/dL — ABNORMAL HIGH (ref 70–99)
Potassium: 3.5 mmol/L (ref 3.5–5.1)
Sodium: 136 mmol/L (ref 135–145)

## 2023-05-23 LAB — GLUCOSE, CAPILLARY
Glucose-Capillary: 230 mg/dL — ABNORMAL HIGH (ref 70–99)
Glucose-Capillary: 248 mg/dL — ABNORMAL HIGH (ref 70–99)
Glucose-Capillary: 237 mg/dL — ABNORMAL HIGH (ref 70–99)
Glucose-Capillary: 278 mg/dL — ABNORMAL HIGH (ref 70–99)
Glucose-Capillary: 275 mg/dL — ABNORMAL HIGH (ref 70–99)
Glucose-Capillary: 246 mg/dL — ABNORMAL HIGH (ref 70–99)

## 2023-05-23 LAB — TRIGLYCERIDES: Triglycerides: 400 mg/dL — ABNORMAL HIGH (ref <150)

## 2023-05-23 LAB — PHOSPHORUS: Phosphorus: 3.2 mg/dL (ref 2.5–4.6)

## 2023-05-23 LAB — PROTIME-INR
INR: 1.1 (ref 0.8–1.2)
Prothrombin Time: 13.9 s (ref 11.4–15.2)

## 2023-05-23 MED ORDER — SODIUM CHLORIDE 0.9 % IV SOLN: INTRAVENOUS | Status: AC

## 2023-05-23 MED ORDER — MAGNESIUM SULFATE 2 GM/50ML IV SOLN
2.0000 g | Freq: Once | INTRAVENOUS | Status: AC
  Administered 2023-05-23: 2 g via INTRAVENOUS
  Filled 2023-05-23: qty 50

## 2023-05-23 MED ORDER — SODIUM CHLORIDE 0.9 % IV SOLN: INTRAVENOUS | Status: DC

## 2023-05-23 MED ORDER — TRAVASOL 10 % IV SOLN
INTRAVENOUS | Status: DC
  Filled 2023-05-23 (×2): qty 576

## 2023-05-23 MED ORDER — TRAVASOL 10 % IV SOLN
INTRAVENOUS | Status: AC
  Filled 2023-05-23: qty 576

## 2023-05-23 NOTE — Progress Notes (Signed)
PROGRESS NOTE  Brent [name]. ZHY:[id number] DOB: 1946/02/24 DOA: 05/21/2023 PCP: [name], MD   LOS: 2 days   Brief Narrative / Interim history:  77 y.o. 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  G-tube was initially placed in 2021 when he got diagnosed with his neurological issues, was removed in May 2023 but the gastrocutaneous fistula has failed to heal since then, several attempts to close have been attempted in 2023 and 2024.  His fistula has grown larger and he decided to come to the hospital. General surgery consulted.   Subjective / 24h Interval events: Complains of a headache, no chest pain, no shortness of breath, no abdominal pain, no nausea or vomiting  Assesement and Plan: Principal problem Gastrocutaneous fistula-surgery consulted, appreciate input.  Per my discussion yesterday with the surgical team they are thinking about repair during current hospitalization.  Per surgery, patient was started on TPN and had a PICC line placed on 9/2. -CT scan on admission showed a fistula but no intraperitoneal leakage or associated inflammatory changes around it   Active problems Insulin-dependent diabetes mellitus, with hyperglycemia-continue current insulin regimen  Lab Results  Component Value Date   HGBA1C 8.0 (H) 05/22/2023   CBG (last 3)  Recent Labs    05/23/23 0208 05/23/23 0458 05/23/23 0739  GLUCAP 230* 248* 237*    Essential hypertension-continue home medications   Hyperlipidemia-continue statin   Stage IV sacral ulcer, chronic, POA-wound care consult   Neurogenic bladder, with suprapubic catheter -monitor, currently afebrile, no leukocytosis, do not suspect UTI.  Patient usually gets diffuse bodyaches, malaise when he gets a UTI.  Currently he  does not have any symptoms   Depression-continue home medications   Functional quadriplegia secondary to transverse myelitis - noted  Scheduled Meds:  atorvastatin  40 mg Oral QHS   baclofen  20 mg Oral TID   Chlorhexidine Gluconate Cloth  6 each Topical Daily   DULoxetine  30 mg Oral QHS   enoxaparin (LOVENOX) injection  40 mg Subcutaneous Q24H   famotidine  20 mg Oral BID   Gerhardt's butt cream   Topical TID   insulin aspart  0-20 Units Subcutaneous Q4H   leptospermum manuka honey  1 Application Topical Daily   losartan  75 mg Oral Daily   pantoprazole  40 mg Oral Daily   sodium chloride flush  10-40 mL Intracatheter Q12H   traZODone  75 mg Oral QHS   Continuous Infusions:  sodium chloride     sodium chloride     magnesium sulfate bolus IVPB     TPN ADULT (ION) Stopped (05/23/23 0312)   TPN ADULT (ION)     PRN Meds:.acetaminophen **OR** acetaminophen, hydrALAZINE, LORazepam, ondansetron **OR** ondansetron (ZOFRAN) IV, polyvinyl alcohol, sodium chloride flush, traMADol  Current Outpatient Medications  Medication Instructions   Alogliptin Benzoate 25 mg, Oral, Daily   Amino Acids-Protein Hydrolys (FEEDING SUPPLEMENT, PRO-STAT 64,) LIQD 30 mLs, Oral, 2 times daily   ascorbic acid (VITAMIN C) 500 mg, Oral, Daily   atenolol (TENORMIN) 12.5-25 mg, Oral, See admin instructions, Take 12.5 mg (1/2 tablet) by mouth once daily in the morning and 25 mg (1 tablet) at bedtime.    atorvastatin (LIPITOR) 20 mg, Oral, Daily   B COMPLEX VITAMINS PO 1 capsule, Oral, Daily   baclofen (LIORESAL) 20 mg, Oral, 3 times daily  chlorhexidine (HIBICLENS) 4 % external liquid 1 Application, Topical, Every other day   cholecalciferol (VITAMIN D3) 1,000 Units, Oral, Daily   DULoxetine (CYMBALTA) 30 mg, Oral, Nightly   famotidine (PEPCID) 20 mg, Oral, 2 times daily   fexofenadine (ALLEGRA) 180 mg, Oral, Daily   Fish Oil 1,000 mg, Oral, 2 times daily   fluticasone (FLONASE) 50 MCG/ACT nasal spray 2  sprays, Each Nare, Daily   insulin aspart (NOVOLOG) 0-15 Units, Subcutaneous, Every 4 hours, Sliding scale  CBG 70 - 120: 0 units: CBG 121 - 150: 2 units; CBG 151 - 200: 3 units; CBG 201 - 250: 5 units; CBG 251 - 300: 8 units;CBG 301 - 350: 11 units; CBG 351 - 400: 15 units; CBG > 400 : 15 units and notify MD   insulin glargine (LANTUS) 15 Units, Subcutaneous, Daily at bedtime   ketoconazole (NIZORAL) 2 % cream 1 Application, Topical, 2 times daily   lamoTRIgine (LAMICTAL) 50 mg, Oral, 2 times daily, For 5 days. Started 02/18/23.    LORazepam (ATIVAN) 0.5 mg, Oral, Daily   losartan (COZAAR) 75 mg, Oral, Daily   Magnesium Oxide (Elemental) 400 mg, Oral, 2 times daily   melatonin 3 mg, Oral, Daily at bedtime   Menthol, Topical Analgesic, (BIOFREEZE) 4 % GEL 1 application , Topical, 2 times daily PRN   metFORMIN (GLUCOPHAGE) 1,000 mg, Oral, 2 times daily with meals   nystatin powder 1 Application, Topical, Daily, Apply to peri-wound bed to sacrum and right ischium daily with routine dressing.    omeprazole (PRILOSEC) 40 mg, Oral, 2 times daily, 30 minutes before breakfast/dinner   ondansetron (ZOFRAN) 4 mg, Oral, Every 8 hours PRN   oxyCODONE (OXY IR/ROXICODONE) 5 mg, Oral, 2 times daily PRN   Polyethyl Glycol-Propyl Glycol (GENTEAL TEARS SEVERE DAY/NIGHT) 0.4-0.3 % GEL ophthalmic gel 1 Application, Both Eyes, 4 times daily PRN   PSYLLIUM PO 0.8 g, Oral, 2 times daily   senna-docusate (SENOKOT-S) 8.6-50 MG tablet 1 tablet, Oral, 2 times daily   spironolactone (ALDACTONE) 25 mg, Oral, Daily   tiZANidine (ZANAFLEX) 2 mg, Oral, Daily at bedtime   traZODone (DESYREL) 75 mg, Oral, Daily at bedtime    Diet Orders (From admission, onward)     Start     Ordered   05/23/23 0620  Diet NPO time specified Except for: Sips with Meds  Diet effective ____       Comments:    Question:  Except for  Answer:  Sips with Meds   05/23/23 0619            DVT prophylaxis: enoxaparin (LOVENOX) injection 40  mg Start: 05/21/23 2000   Lab Results  Component Value Date   PLT 262 05/23/2023      Code Status: Full Code  Family Communication: no family at bedside   Status is: Inpatient Remains inpatient appropriate because: severity of illness  Level of care: Med-Surg  Consultants:  General surgery   Objective: Vitals:   05/22/23 1537 05/22/23 2009 05/23/23 0518 05/23/23 0739  BP: (!) 156/54 (!) 163/55 (!) 161/49 (!) 168/55  Pulse: 60 61 (!) 58 (!) 57  Resp:  16 16   Temp: 97.8 F (36.6 C) 97.9 F (36.6 C) 97.7 F (36.5 C) (!) 97.5 F (36.4 C)  TempSrc: Oral   Oral  SpO2: 99% 100% 97% 97%  Weight:      Height:        Intake/Output Summary (Last 24 hours) at 05/23/2023 1017 Last data filed  at 05/23/2023 0339 Gross per 24 hour  Intake 1464.42 ml  Output 900 ml  Net 564.42 ml   Wt Readings from Last 3 Encounters:  05/22/23 97 kg  10/13/22 97.5 kg  07/11/22 93 kg    Examination:  Constitutional: NAD Eyes: lids and conjunctivae normal, no scleral icterus ENMT: mmm Neck: normal, supple Respiratory: clear to auscultation bilaterally, no wheezing, no crackles. Normal respiratory effort.  Cardiovascular: Regular rate and rhythm, no murmurs / rubs / gallops. No LE edema. Abdomen: soft, no distention, no tenderness. Bowel sounds positive.   Data Reviewed: I have independently reviewed following labs and imaging studies   CBC Recent Labs  Lab 05/21/23 1347 05/22/23 0331 05/23/23 0811  WBC 7.9 7.7 5.8  HGB 11.4* 11.3* 11.1*  HCT 37.3* 35.6* 35.5*  PLT 293 296 262  MCV 88.2 87.0 87.0  MCH 27.0 27.6 27.2  MCHC 30.6 31.7 31.3  RDW 15.9* 15.9* 15.6*  LYMPHSABS 1.6  --  1.2  MONOABS 0.8  --  0.6  EOSABS 0.4  --  0.3  BASOSABS 0.0  --  0.0    Recent Labs  Lab 05/21/23 1347 05/21/23 1737 05/22/23 0331 05/22/23 0344 05/23/23 0445 05/23/23 0811  NA 139  --  136  --  137 136  K 3.8  --  3.3*  --  3.5 3.5  CL 105  --  105  --  104 103  CO2 23  --  22  --  24 22   GLUCOSE 91  --  160*  --  241* 261*  BUN 11  --  9  --  7* 7*  CREATININE 0.59*  --  0.57*  --  0.58* 0.56*  CALCIUM 8.8*  --  8.6*  --  8.6* 8.8*  AST 33  --  27  --  24  --   ALT 28  --  24  --  23  --   ALKPHOS 72  --  66  --  63  --   BILITOT 0.4  --  0.7  --  0.7  --   ALBUMIN 3.0*  --  2.9*  --  2.7*  --   MG  --   --   --   --  1.6*  --   LATICACIDVEN 1.2 1.5  --   --   --   --   INR  --   --   --   --   --  1.1  HGBA1C  --   --   --  8.0*  --   --     ------------------------------------------------------------------------------------------------------------------ Recent Labs    05/22/23 1000 05/23/23 0445  TRIG 377* 400*    Lab Results  Component Value Date   HGBA1C 8.0 (H) 05/22/2023   ------------------------------------------------------------------------------------------------------------------ No results for input(s): "TSH", "T4TOTAL", "T3FREE", "THYROIDAB" in the last 72 hours.  Invalid input(s): "FREET3"  Cardiac Enzymes No results for input(s): "CKMB", "TROPONINI", "MYOGLOBIN" in the last 168 hours.  Invalid input(s): "CK" ------------------------------------------------------------------------------------------------------------------    Component Value Date/Time   BNP 77.0 02/23/2023 0717    CBG: Recent Labs  Lab 05/22/23 1706 05/22/23 2157 05/23/23 0208 05/23/23 0458 05/23/23 0739  GLUCAP 167* 230* 230* 248* 237*    No results found for this or any previous visit (from the past 240 hour(s)).   Radiology Studies: No results found.   Pamella Pert, MD, PhD [facility]  Between 7 am - 7 pm I am available, please contact me via  Amion (for emergencies) or Securechat (non urgent messages)  Between 7 pm - 7 am I am not available, please contact night coverage MD/APP via Amion

## 2023-05-23 NOTE — Progress Notes (Signed)
PHARMACY - TOTAL PARENTERAL NUTRITION CONSULT NOTE   Indication:  GC Fistula  Patient Measurements: Height: 6' (182.9 cm) Weight: 97 kg (213 lb 13.5 oz) IBW/kg (Calculated) : 77.6 TPN AdjBW (KG): 82.5 Body mass index is 29 kg/m. Usual Weight: 97kg on admission   Assessment:  Patient admitted with worsening of GC fistula. 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 Patient with G-tube placed in 2021 which was then removed in 2023 due to issues with healing. There have been several attempts to close GC fistula in 2023 and 2024. General surgery consulted NPO for now to evaluate fistula output. Depending on clinical course potential for surgical intervention while inpatient in addition to post-pyloric feeds. However per general surgery, will start TPN 9/2.  Noted that MAR states TPN stopped 9/3 0312 - however, I have confirmed with RN that TPN is currently running with no issues at 40 ml/hr.  Glucose / Insulin: A1C 8.0%, patient taking 42 units of insulin glargine and SSI PTA, CBGs > 200 since TPN start yesterday. Required 27 units SSI/24h. Of note, pt with D5NS at 35 ml/hr providing dextrose. Electrolytes: K 3.5 (low end of nl), Mg 1.6, corrected Ca 9.6. Others wnl.   Renal: Scr: 0.58, BUN 7 Hepatic: LFTs WNL, albumin 2.7, pre-albumin: 19, TG 400,  Intake / Output; MIVF: D5/1/2NS @ 74mL/hr, UOP , drain and stool o/p -none charted today.  GI Imaging: 9/1 CT abdomen pelvis: GC fistula shown, trace pleural effusions  GI Surgeries / Procedures:  None   Central access: PICC placed 9/2  TPN start date: 9/2   Nutritional Goals: Goal TPN rate is ~100 mL/hr (provides 130 g of protein and 2300 kcal, slightly under kcal goal but hope to be able to increase % dextrose and lipids to increase kcal - depending on TG and CBGs)  RD Assessment:  Estimated  Needs Total Energy Estimated Needs: 2400-2700kcals Total Protein Estimated Needs: 120-130 gm Total Fluid Estimated Needs: 2.4-2.7 L  Current Nutrition:  NPO  Plan:  Continue TPN at 40 mL/hr at 1800, will provide 58g AA and 944 kcal meeting approximately 50% of needs. Will advance to goal as tolerated - goal CBGs <200 to advance rate. Empirically lowered lipid content to 20% of total Kcal content with TG elevated to 400. Magnesium sulfate 2gm IV  Electrolytes in TPN: Na 65mEq/L, Increase K to 72mEq/L, Ca 62mEq/L, Increase Mg to 92mEq/L, and Phos 47mmol/L. Cl:Ac 1:2 Add standard MVI and trace elements to TPN Continuee Resistant q4h SSI at TPN  Increase regular insulin in TPN to 30 units Change MIVF to NS at 35 ml/hr Monitor TPN labs on Mon/Thurs, daily until stable at goal rate  Christoper Fabian, PharmD, BCPS Please see amion for complete clinical pharmacist phone list 05/23/2023,8:41 AM

## 2023-05-23 NOTE — Progress Notes (Signed)
Unsuccessful attempt for placement of ultrasound guided PIV. No other suitable veins found. RN at bedside, aware unable to place I.V. with ultrasound.

## 2023-05-23 NOTE — Progress Notes (Signed)
[facility] Progress Note     Subjective: CC-  No complaints this morning. GC fistula output not recorded, unsure total volume. About 50cc in pouch currently. Patient states that it was emptied at least twice over night.  Objective: Vital signs in last 24 hours: Temp:  [97.5 F (36.4 C)-97.9 F (36.6 C)] 97.5 F (36.4 C) (09/03 0739) Pulse Rate:  [57-61] 57 (09/03 0739) Resp:  [16] 16 (09/03 0518) BP: (156-168)/(49-55) 168/55 (09/03 0739) SpO2:  [97 %-100 %] 97 % (09/03 0739) Last BM Date : 05/21/23  Intake/Output from previous day: 09/02 0701 - 09/03 0700 In: 1464.4 [I.V.:1064.4; IV Piggyback:400] Out: 900 [Urine:900] Intake/Output this shift: No intake/output data recorded.  PE: Gen:  Alert, NAD, pleasant Abd: obese, soft, nontender, ostomy viable with brown stool in pouch, LUQ GC fistula with pouch in place/ good seal and thin gastric contents in pouch  Lab Results:  Recent Labs    05/22/23 0331 05/23/23 0811  WBC 7.7 5.8  HGB 11.3* 11.1*  HCT 35.6* 35.5*  PLT 296 262   BMET Recent Labs    05/23/23 0445 05/23/23 0811  NA 137 136  K 3.5 3.5  CL 104 103  CO2 24 22  GLUCOSE 241* 261*  BUN 7* 7*  CREATININE 0.58* 0.56*  CALCIUM 8.6* 8.8*   PT/INR Recent Labs    05/23/23 0811  LABPROT 13.9  INR 1.1   CMP     Component Value Date/Time   NA 136 05/23/2023 0811   NA 140 04/11/2019 1028   K 3.5 05/23/2023 0811   CL 103 05/23/2023 0811   CO2 22 05/23/2023 0811   GLUCOSE 261 (H) 05/23/2023 0811   BUN 7 (L) 05/23/2023 0811   BUN 21 04/11/2019 1028   CREATININE 0.56 (L) 05/23/2023 0811   CREATININE 0.26 (L) 07/23/2020 1052   CALCIUM 8.8 (L) 05/23/2023 0811   PROT 5.5 (L) 05/23/2023 0445   PROT 6.6 01/20/2020 0754   ALBUMIN 2.7 (L) 05/23/2023 0445   ALBUMIN 4.2 03/10/2020 1757   AST 24 05/23/2023 0445   ALT 23 05/23/2023 0445   ALKPHOS 63 05/23/2023 0445   BILITOT 0.7 05/23/2023 0445   BILITOT 0.3 01/20/2020 0754   GFRNONAA >60  05/23/2023 0811   GFRNONAA 87 04/15/2016 1133   GFRAA >60 06/10/2020 0431   GFRAA >89 04/15/2016 1133   Lipase     Component Value Date/Time   LIPASE 20 05/21/2023 1347       Studies/Results: [place] EKG SITE RITE  Result Date: 05/22/2023 If Site Rite image not attached, placement could not be confirmed due to current cardiac rhythm.  CT ABDOMEN PELVIS W CONTRAST  Result Date: 05/21/2023 CLINICAL DATA:  Abdominal pain near fistula/gastric tube site. EXAM: CT ABDOMEN AND PELVIS WITH CONTRAST TECHNIQUE: Multidetector CT imaging of the abdomen and pelvis was performed using the standard protocol following bolus administration of intravenous contrast. RADIATION DOSE REDUCTION: This exam was performed according to the departmental dose-optimization program which includes automated exposure control, adjustment of the mA and/or kV according to patient size and/or use of iterative reconstruction technique. CONTRAST:  75mL OMNIPAQUE IOHEXOL 350 MG/ML SOLN COMPARISON:  CT abdomen and pelvis dated 12/08/2022 and CT abdomen and pelvis dated 03/25/2022. FINDINGS: Lower chest: Trace bilateral pleural effusions with associated atelectasis are partially imaged. Hepatobiliary: No focal liver abnormality is seen. Status post cholecystectomy. No biliary dilatation. Pancreas: Unremarkable. No pancreatic ductal dilatation or surrounding inflammatory changes. Spleen: Normal in size without focal abnormality. Adrenals/Urinary Tract:  Adrenal glands are unremarkable. Hyperdensity measuring 3.0 cm in the left kidney likely reflects retained contrast. A left renal cyst measures 3.6 cm. No imaging follow-up is recommended for this finding. No renal calculi or hydronephrosis on either side. A suprapubic catheter is noted and the urinary bladder is deflated. Stomach/Bowel: A gastro-cutaneous fistula along the anterior aspect of the gastric body is redemonstrated. No significant associated fat stranding or inflammatory changes to  suggest infection. No evidence of intraperitoneal leakage or abscess formation. The patient is status post a left lower quadrant loop colostomy. Appendix appears normal. There is colonic diverticulosis without evidence of diverticulitis. No evidence of bowel wall thickening, distention, or inflammatory changes. Vascular/Lymphatic: Aortic atherosclerosis. No enlarged abdominal or pelvic lymph nodes. Reproductive: Prostate is unremarkable. Other: No abdominal wall hernia or abnormality. No abdominopelvic ascites. Musculoskeletal: Degenerative changes are seen in the spine. Chronic decubitus ulcer involving the right ischium as well as chronic deformity of the sacrum and coccyx appear similar to prior exam. IMPRESSION: 1. Redemonstration of a gastro-cutaneous fistula without evidence of intraperitoneal leakage or associated inflammatory changes. 2. Trace bilateral pleural effusions with associated atelectasis. Aortic Atherosclerosis (ICD10-I70.0). Electronically Signed   By: Romona Curls M.D.   On: 05/21/2023 16:21    Anti-infectives: Anti-infectives (From admission, onward)    None        Assessment/Plan GC Fistula - failed multiple minimally invasive attempts to treat this - Continue NPO, PICC/TPN.  - Pouch to GC fistula to quantify output - discussed with staff importance of documenting this - This likely will not heal on its own. Will discuss timing of any surgical intervention with MD.   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 Epimenio Foot of [facility] IDDM HTN HLD Neurogenic bladder with SP tube  I reviewed hospitalist notes, last 24 h vitals and pain scores, last 48 h intake and output, and last 24 h labs and trends.    LOS: 2 days    Franne Forts, [facility]  Surgery 05/23/2023, 10:15 AM Please see Amion for pager number during day hours 7:00am-4:30pm

## 2023-05-23 NOTE — Plan of Care (Signed)

## 2023-05-24 ENCOUNTER — Inpatient Hospital Stay (HOSPITAL_COMMUNITY): Payer: Medicare Other

## 2023-05-24 ENCOUNTER — Encounter (HOSPITAL_COMMUNITY)
Admission: EM | Disposition: A | Discharge status: Skilled Nursing Facility | Payer: Self-pay | Source: Skilled Nursing Facility | Attending: Internal Medicine

## 2023-05-24 ENCOUNTER — Encounter (HOSPITAL_COMMUNITY): Payer: Self-pay | Admitting: Internal Medicine

## 2023-05-24 ENCOUNTER — Other Ambulatory Visit: Payer: Self-pay

## 2023-05-24 DIAGNOSIS — K9420 Gastrostomy complication, unspecified: Secondary | ICD-10-CM | POA: Diagnosis not present

## 2023-05-24 DIAGNOSIS — K316 Fistula of stomach and duodenum: Secondary | ICD-10-CM | POA: Diagnosis not present

## 2023-05-24 DIAGNOSIS — I10 Essential (primary) hypertension: Secondary | ICD-10-CM

## 2023-05-24 DIAGNOSIS — E785 Hyperlipidemia, unspecified: Secondary | ICD-10-CM

## 2023-05-24 DIAGNOSIS — Z87891 Personal history of nicotine dependence: Secondary | ICD-10-CM

## 2023-05-24 HISTORY — PX: LAPAROSCOPY: SHX197

## 2023-05-24 HISTORY — PX: LYSIS OF ADHESION: SHX5961

## 2023-05-24 HISTORY — PX: LAPAROTOMY: SHX154

## 2023-05-24 LAB — COMPREHENSIVE METABOLIC PANEL
ALT: 24 U/L (ref 0–44)
AST: 23 U/L (ref 15–41)
Albumin: 2.7 g/dL — ABNORMAL LOW (ref 3.5–5.0)
Alkaline Phosphatase: 67 U/L (ref 38–126)
Anion gap: 9 (ref 5–15)
BUN: 8 mg/dL (ref 8–23)
CO2: 23 mmol/L (ref 22–32)
Calcium: 8.6 mg/dL — ABNORMAL LOW (ref 8.9–10.3)
Chloride: 104 mmol/L (ref 98–111)
Creatinine, Ser: 0.46 mg/dL — ABNORMAL LOW (ref 0.61–1.24)
GFR, Estimated: >60 mL/min (ref >60)
Glucose, Bld: 240 mg/dL — ABNORMAL HIGH (ref 70–99)
Potassium: 3.3 mmol/L — ABNORMAL LOW (ref 3.5–5.1)
Sodium: 136 mmol/L (ref 135–145)
Total Bilirubin: 0.6 mg/dL (ref 0.3–1.2)
Total Protein: 5.5 g/dL — ABNORMAL LOW (ref 6.5–8.1)

## 2023-05-24 LAB — GLUCOSE, CAPILLARY
Glucose-Capillary: 230 mg/dL — ABNORMAL HIGH (ref 70–99)
Glucose-Capillary: 246 mg/dL — ABNORMAL HIGH (ref 70–99)
Glucose-Capillary: 248 mg/dL — ABNORMAL HIGH (ref 70–99)
Glucose-Capillary: 266 mg/dL — ABNORMAL HIGH (ref 70–99)
Glucose-Capillary: 270 mg/dL — ABNORMAL HIGH (ref 70–99)
Glucose-Capillary: 291 mg/dL — ABNORMAL HIGH (ref 70–99)
Glucose-Capillary: 293 mg/dL — ABNORMAL HIGH (ref 70–99)
Glucose-Capillary: 299 mg/dL — ABNORMAL HIGH (ref 70–99)

## 2023-05-24 LAB — CBC
HCT: 33.1 % — ABNORMAL LOW (ref 39.0–52.0)
Hemoglobin: 10.3 g/dL — ABNORMAL LOW (ref 13.0–17.0)
MCH: 26.8 pg (ref 26.0–34.0)
MCHC: 31.1 g/dL (ref 30.0–36.0)
MCV: 86.2 fL (ref 80.0–100.0)
Platelets: 265 10*3/uL (ref 150–400)
RBC: 3.84 MIL/uL — ABNORMAL LOW (ref 4.22–5.81)
RDW: 15.7 % — ABNORMAL HIGH (ref 11.5–15.5)
WBC: 6.4 10*3/uL (ref 4.0–10.5)
nRBC: 0 % (ref 0.0–0.2)

## 2023-05-24 LAB — TYPE AND SCREEN
ABO/RH(D): O POS
Antibody Screen: NEGATIVE

## 2023-05-24 LAB — SURGICAL PCR SCREEN
MRSA, PCR: POSITIVE — AB
Staphylococcus aureus: POSITIVE — AB

## 2023-05-24 LAB — PHOSPHORUS: Phosphorus: 2.9 mg/dL (ref 2.5–4.6)

## 2023-05-24 LAB — MAGNESIUM: Magnesium: 1.8 mg/dL (ref 1.7–2.4)

## 2023-05-24 SURGERY — LAPAROSCOPY, DIAGNOSTIC
Anesthesia: General | Site: Abdomen

## 2023-05-24 MED ORDER — OXYCODONE HCL 5 MG PO TABS: 5.0000 mg | ORAL_TABLET | Freq: Once | ORAL | Status: DC | PRN

## 2023-05-24 MED ORDER — TRAVASOL 10 % IV SOLN
INTRAVENOUS | Status: AC
  Filled 2023-05-24: qty 950.4

## 2023-05-24 MED ORDER — MIDAZOLAM HCL 2 MG/2ML IJ SOLN
INTRAMUSCULAR | Status: AC
  Filled 2023-05-24: qty 2

## 2023-05-24 MED ORDER — MUPIROCIN 2 % EX OINT
1.0000 | TOPICAL_OINTMENT | Freq: Two times a day (BID) | CUTANEOUS | Status: AC
  Administered 2023-05-24 – 2023-05-28 (×9): 1 via NASAL
  Filled 2023-05-24 (×3): qty 22

## 2023-05-24 MED ORDER — PHENYLEPHRINE 80 MCG/ML (10ML) SYRINGE FOR IV PUSH (FOR BLOOD PRESSURE SUPPORT)
PREFILLED_SYRINGE | INTRAVENOUS | Status: DC | PRN
  Administered 2023-05-24: 160 ug via INTRAVENOUS
  Administered 2023-05-24 (×3): 80 ug via INTRAVENOUS

## 2023-05-24 MED ORDER — PHENYLEPHRINE 80 MCG/ML (10ML) SYRINGE FOR IV PUSH (FOR BLOOD PRESSURE SUPPORT)
PREFILLED_SYRINGE | INTRAVENOUS | Status: AC
  Filled 2023-05-24: qty 10

## 2023-05-24 MED ORDER — CHLORHEXIDINE GLUCONATE 0.12 % MT SOLN: 15.0000 mL | Freq: Once | OROMUCOSAL | Status: AC

## 2023-05-24 MED ORDER — LIDOCAINE 2% (20 MG/ML) 5 ML SYRINGE
INTRAMUSCULAR | Status: DC | PRN
  Administered 2023-05-24: 100 mg via INTRAVENOUS

## 2023-05-24 MED ORDER — ACETAMINOPHEN 650 MG RE SUPP: 650.0000 mg | Freq: Four times a day (QID) | RECTAL | Status: DC | PRN

## 2023-05-24 MED ORDER — ACETAMINOPHEN 10 MG/ML IV SOLN
INTRAVENOUS | Status: AC
  Filled 2023-05-24: qty 100

## 2023-05-24 MED ORDER — ROCURONIUM BROMIDE 10 MG/ML (PF) SYRINGE
PREFILLED_SYRINGE | INTRAVENOUS | Status: AC
  Filled 2023-05-24: qty 10

## 2023-05-24 MED ORDER — ONDANSETRON HCL 4 MG/2ML IJ SOLN
INTRAMUSCULAR | Status: AC
  Filled 2023-05-24: qty 2

## 2023-05-24 MED ORDER — FENTANYL CITRATE (PF) 100 MCG/2ML IJ SOLN
INTRAMUSCULAR | Status: AC
  Filled 2023-05-24: qty 2

## 2023-05-24 MED ORDER — MAGNESIUM SULFATE 2 GM/50ML IV SOLN
2.0000 g | Freq: Once | INTRAVENOUS | Status: AC
  Administered 2023-05-24: 2 g via INTRAVENOUS
  Filled 2023-05-24: qty 50

## 2023-05-24 MED ORDER — POTASSIUM CHLORIDE 10 MEQ/50ML IV SOLN
10.0000 meq | INTRAVENOUS | Status: AC
  Administered 2023-05-24 (×4): 10 meq via INTRAVENOUS
  Filled 2023-05-24 (×4): qty 50

## 2023-05-24 MED ORDER — MORPHINE SULFATE (PF) 2 MG/ML IV SOLN
2.0000 mg | INTRAVENOUS | Status: DC | PRN
  Administered 2023-05-24 – 2023-05-25 (×3): 2 mg via INTRAVENOUS
  Filled 2023-05-24 (×3): qty 1

## 2023-05-24 MED ORDER — TRAMADOL HCL 50 MG PO TABS
50.0000 mg | ORAL_TABLET | Freq: Four times a day (QID) | ORAL | Status: DC | PRN
  Administered 2023-05-24 – 2023-05-26 (×2): 100 mg via ORAL
  Filled 2023-05-24 (×2): qty 2

## 2023-05-24 MED ORDER — ACETAMINOPHEN 500 MG PO TABS
1000.0000 mg | ORAL_TABLET | Freq: Four times a day (QID) | ORAL | Status: DC | PRN
  Administered 2023-05-27 – 2023-05-30 (×2): 1000 mg via ORAL
  Filled 2023-05-24 (×2): qty 2

## 2023-05-24 MED ORDER — ORAL CARE MOUTH RINSE: 15.0000 mL | Freq: Once | OROMUCOSAL | Status: AC

## 2023-05-24 MED ORDER — FENTANYL CITRATE (PF) 100 MCG/2ML IJ SOLN
25.0000 ug | INTRAMUSCULAR | Status: DC | PRN
  Administered 2023-05-24: 25 ug via INTRAVENOUS

## 2023-05-24 MED ORDER — FENTANYL CITRATE (PF) 250 MCG/5ML IJ SOLN
INTRAMUSCULAR | Status: DC | PRN
  Administered 2023-05-24: 100 ug via INTRAVENOUS
  Administered 2023-05-24: 50 ug via INTRAVENOUS

## 2023-05-24 MED ORDER — PROPOFOL 10 MG/ML IV BOLUS
INTRAVENOUS | Status: AC
  Filled 2023-05-24: qty 20

## 2023-05-24 MED ORDER — SODIUM CHLORIDE 0.9 % IR SOLN
Status: DC | PRN
  Administered 2023-05-24: 1000 mL

## 2023-05-24 MED ORDER — ONDANSETRON HCL 4 MG/2ML IJ SOLN
INTRAMUSCULAR | Status: DC | PRN
  Administered 2023-05-24: 4 mg via INTRAVENOUS

## 2023-05-24 MED ORDER — 0.9 % SODIUM CHLORIDE (POUR BTL) OPTIME
TOPICAL | Status: DC | PRN
  Administered 2023-05-24: 1000 mL

## 2023-05-24 MED ORDER — FENTANYL CITRATE (PF) 250 MCG/5ML IJ SOLN
INTRAMUSCULAR | Status: AC
  Filled 2023-05-24: qty 5

## 2023-05-24 MED ORDER — CHLORHEXIDINE GLUCONATE 0.12 % MT SOLN
OROMUCOSAL | Status: AC
  Administered 2023-05-24: 15 mL via OROMUCOSAL
  Filled 2023-05-24: qty 15

## 2023-05-24 MED ORDER — ACETAMINOPHEN 10 MG/ML IV SOLN
1000.0000 mg | Freq: Once | INTRAVENOUS | Status: DC | PRN
  Administered 2023-05-24: 1000 mg via INTRAVENOUS

## 2023-05-24 MED ORDER — BUPIVACAINE-EPINEPHRINE (PF) 0.25% -1:200000 IJ SOLN
INTRAMUSCULAR | Status: AC
  Filled 2023-05-24: qty 30

## 2023-05-24 MED ORDER — PROPOFOL 10 MG/ML IV BOLUS
INTRAVENOUS | Status: DC | PRN
  Administered 2023-05-24: 120 mg via INTRAVENOUS

## 2023-05-24 MED ORDER — SODIUM CHLORIDE 0.9 % IV SOLN: INTRAVENOUS | Status: DC

## 2023-05-24 MED ORDER — OXYCODONE HCL 5 MG/5ML PO SOLN: 5.0000 mg | Freq: Once | ORAL | Status: DC | PRN

## 2023-05-24 MED ORDER — INSULIN GLARGINE-YFGN 100 UNIT/ML ~~LOC~~ SOLN
10.0000 [IU] | Freq: Every day | SUBCUTANEOUS | Status: DC
  Administered 2023-05-24: 10 [IU] via SUBCUTANEOUS
  Filled 2023-05-24 (×2): qty 0.1

## 2023-05-24 MED ORDER — BUPIVACAINE-EPINEPHRINE 0.25% -1:200000 IJ SOLN
INTRAMUSCULAR | Status: DC | PRN
  Administered 2023-05-24: 4 mL

## 2023-05-24 MED ORDER — ONDANSETRON HCL 4 MG/2ML IJ SOLN: 4.0000 mg | Freq: Once | INTRAMUSCULAR | Status: DC | PRN

## 2023-05-24 MED ORDER — EPHEDRINE SULFATE-NACL 50-0.9 MG/10ML-% IV SOSY
PREFILLED_SYRINGE | INTRAVENOUS | Status: DC | PRN
  Administered 2023-05-24: 5 mg via INTRAVENOUS
  Administered 2023-05-24: 10 mg via INTRAVENOUS

## 2023-05-24 MED ORDER — LIDOCAINE 2% (20 MG/ML) 5 ML SYRINGE
INTRAMUSCULAR | Status: AC
  Filled 2023-05-24: qty 5

## 2023-05-24 MED ORDER — LACTATED RINGERS IV SOLN: INTRAVENOUS | Status: DC

## 2023-05-24 MED ORDER — ROCURONIUM BROMIDE 10 MG/ML (PF) SYRINGE
PREFILLED_SYRINGE | INTRAVENOUS | Status: DC | PRN
  Administered 2023-05-24: 70 mg via INTRAVENOUS

## 2023-05-24 SURGICAL SUPPLY — 54 items
ADH SKN CLS APL DERMABOND .7 (GAUZE/BANDAGES/DRESSINGS) ×1
APL PRP STRL LF DISP 70% ISPRP (MISCELLANEOUS) ×1
BAG COUNTER SPONGE SURGICOUNT (BAG) ×1 IMPLANT
BAG SPNG CNTER NS LX DISP (BAG) ×1
CANISTER SUCT 3000ML PPV (MISCELLANEOUS) ×1 IMPLANT
CHLORAPREP W/TINT 26 (MISCELLANEOUS) ×1 IMPLANT
COVER SURGICAL LIGHT HANDLE (MISCELLANEOUS) ×1 IMPLANT
CUTTER FLEX LINEAR 45M (STAPLE) IMPLANT
DERMABOND ADVANCED .7 DNX12 (GAUZE/BANDAGES/DRESSINGS) ×1 IMPLANT
DRAPE LAPAROSCOPIC ABDOMINAL (DRAPES) ×1 IMPLANT
DRAPE WARM FLUID 44X44 (DRAPES) ×1 IMPLANT
DRESSING MEPILEX FLEX 4X4 (GAUZE/BANDAGES/DRESSINGS) IMPLANT
DRSG MEPILEX FLEX 4X4 (GAUZE/BANDAGES/DRESSINGS) ×1
ELECT CAUTERY BLADE 6.4 (BLADE) IMPLANT
ELECT REM PT RETURN 9FT ADLT (ELECTROSURGICAL) ×1
ELECTRODE REM PT RTRN 9FT ADLT (ELECTROSURGICAL) ×1 IMPLANT
GAUZE SPONGE 4X4 12PLY STRL (GAUZE/BANDAGES/DRESSINGS) IMPLANT
GLOVE BIO SURGEON STRL SZ7 (GLOVE) ×1 IMPLANT
GLOVE BIOGEL PI IND STRL 7.5 (GLOVE) ×1 IMPLANT
GOWN STRL REUS W/ TWL LRG LVL3 (GOWN DISPOSABLE) ×3 IMPLANT
GOWN STRL REUS W/TWL LRG LVL3 (GOWN DISPOSABLE) ×4
GRASPER SUT TROCAR 14GX15 (MISCELLANEOUS) IMPLANT
KIT BASIN OR (CUSTOM PROCEDURE TRAY) ×1 IMPLANT
KIT OSTOMY DRAINABLE 2.75 STR (WOUND CARE) IMPLANT
KIT TURNOVER KIT B (KITS) ×1 IMPLANT
NS IRRIG 1000ML POUR BTL (IV SOLUTION) ×2 IMPLANT
PACK GENERAL/GYN (CUSTOM PROCEDURE TRAY) ×1 IMPLANT
PAD ARMBOARD 7.5X6 YLW CONV (MISCELLANEOUS) ×1 IMPLANT
PENCIL SMOKE EVACUATOR (MISCELLANEOUS) ×1 IMPLANT
RELOAD 45 THICK GREEN (ENDOMECHANICALS) ×3 IMPLANT
RELOAD STAPLE 45 GRN THCK ETS (ENDOMECHANICALS) IMPLANT
SCISSORS LAP 5X35 DISP (ENDOMECHANICALS) IMPLANT
SET TUBE SMOKE EVAC HIGH FLOW (TUBING) ×1 IMPLANT
SLEEVE Z-THREAD 5X100MM (TROCAR) ×1 IMPLANT
STAPLER VISISTAT 35W (STAPLE) ×1 IMPLANT
STRIP CLOSURE SKIN 1/2X4 (GAUZE/BANDAGES/DRESSINGS) IMPLANT
SUT MNCRL AB 4-0 PS2 18 (SUTURE) IMPLANT
SUT NOVA NAB DX-16 0-1 5-0 T12 (SUTURE) IMPLANT
SUT PDS AB 1 TP1 96 (SUTURE) ×2 IMPLANT
SUT SILK 2 0 (SUTURE) ×1
SUT SILK 2 0 SH CR/8 (SUTURE) ×1 IMPLANT
SUT SILK 2-0 18XBRD TIE 12 (SUTURE) ×1 IMPLANT
SUT SILK 3 0 (SUTURE) ×1
SUT SILK 3 0 SH CR/8 (SUTURE) ×1 IMPLANT
SUT SILK 3-0 18XBRD TIE 12 (SUTURE) ×1 IMPLANT
SUT VICRYL 0 UR6 27IN ABS (SUTURE) IMPLANT
TOWEL GREEN STERILE (TOWEL DISPOSABLE) ×1 IMPLANT
TOWEL GREEN STERILE FF (TOWEL DISPOSABLE) ×1 IMPLANT
TRAY FOLEY MTR SLVR 16FR STAT (SET/KITS/TRAYS/PACK) ×1 IMPLANT
TRAY LAPAROSCOPIC MC (CUSTOM PROCEDURE TRAY) ×1 IMPLANT
TROCAR BALLN 12MMX100 BLUNT (TROCAR) IMPLANT
TROCAR Z-THREAD OPTICAL 5X100M (TROCAR) ×1 IMPLANT
WARMER LAPAROSCOPE (MISCELLANEOUS) ×1 IMPLANT
YANKAUER SUCT BULB TIP NO VENT (SUCTIONS) IMPLANT

## 2023-05-24 NOTE — Anesthesia Procedure Notes (Signed)
Procedure Name: Intubation Date/Time: 05/24/2023 1:54 PM  Performed by: Georgianne Fick D, CRNAPre-anesthesia Checklist: Patient identified, Emergency Drugs available, Suction available and Patient being monitored Patient Re-evaluated:Patient Re-evaluated prior to induction Oxygen Delivery Method: Circle System Utilized Preoxygenation: Pre-oxygenation with 100% oxygen Induction Type: IV induction Ventilation: Mask ventilation without difficulty and Oral airway inserted - appropriate to patient size Laryngoscope Size: Hyacinth Meeker and 3 Grade View: Grade I Tube type: Oral Tube size: 7.5 mm Number of attempts: 1 Airway Equipment and Method: Stylet and Oral airway Placement Confirmation: ETT inserted through vocal cords under direct vision, positive ETCO2 and breath sounds checked- equal and bilateral Secured at: 21 cm Tube secured with: Tape Dental Injury: Teeth and Oropharynx as per pre-operative assessment

## 2023-05-24 NOTE — Care Management Important Message (Signed)
Important Message  Patient Details  Name: Brent [name]. MRN: [id number] Date of Birth: 23-Jul-1946   Medicare Important Message Given:  Yes     Constantine Ruddick 05/24/2023, 4:18 PM

## 2023-05-24 NOTE — Anesthesia Postprocedure Evaluation (Signed)
Anesthesia Post Note  Patient: Brent [name].  Procedure(s) Performed: LAPAROSCOPY DIAGNOSTIC (Abdomen) WEDGED RESECTION OF STOMACH AND GASTROCUTANEOUS FISTULA (Abdomen) LYSIS OF ADHESION (Abdomen)     Patient location during evaluation: PACU Anesthesia Type: General Level of consciousness: awake and alert Pain management: pain level controlled Vital Signs Assessment: post-procedure vital signs reviewed and stable Respiratory status: spontaneous breathing, nonlabored ventilation, respiratory function stable and patient connected to nasal cannula oxygen Cardiovascular status: blood pressure returned to baseline and stable Postop Assessment: no apparent nausea or vomiting Anesthetic complications: no   No notable events documented.  Last Vitals:  Vitals:   05/24/23 1545 05/24/23 1600  BP: (!) 159/61 (!) 165/64  Pulse: 80 95  Resp: 15 18  Temp:    SpO2: 94% 95%    Last Pain:  Vitals:   05/24/23 1600  TempSrc:   PainSc: 4                  Mariann Barter

## 2023-05-24 NOTE — Anesthesia Preprocedure Evaluation (Signed)
Anesthesia Evaluation  Patient identified by MRN, date of birth, ID band Patient awake    Reviewed: Allergy & Precautions, NPO status , Patient's Chart, lab work & pertinent test results, reviewed documented beta blocker date and time   History of Anesthesia Complications Negative for: history of anesthetic complications  Airway Mallampati: II  TM Distance: >3 FB Neck ROM: Full    Dental no notable dental hx.    Pulmonary sleep apnea , neg COPD, former smoker   breath sounds clear to auscultation       Cardiovascular Exercise Tolerance: Poor hypertension, + CAD, + Past MI, + Cardiac Stents and + Peripheral Vascular Disease  (-) CHF and (-) Orthopnea  Rhythm:Regular Rate:Normal     Neuro/Psych  Headaches PSYCHIATRIC DISORDERS  Depression     Neuromuscular disease    GI/Hepatic ,neg GERD  ,,(+) neg Cirrhosis        Endo/Other  diabetes, Well Controlled, Type 2    Renal/GU Renal disease (stone dz)     Musculoskeletal   Abdominal   Peds  Hematology  (+) Blood dyscrasia, anemia 10.3/265   Anesthesia Other Findings   Reproductive/Obstetrics                              Anesthesia Physical Anesthesia Plan  ASA: 3  Anesthesia Plan: General   Post-op Pain Management:    Induction: Intravenous  PONV Risk Score and Plan: 2 and Ondansetron and Dexamethasone  Airway Management Planned: Oral ETT  Additional Equipment:   Intra-op Plan:   Post-operative Plan: Extubation in OR  Informed Consent: I have reviewed the patients History and Physical, chart, labs and discussed the procedure including the risks, benefits and alternatives for the proposed anesthesia with the patient or authorized representative who has indicated his/her understanding and acceptance.     Dental advisory given  Plan Discussed with: CRNA  Anesthesia Plan Comments:          Anesthesia Quick  Evaluation

## 2023-05-24 NOTE — Op Note (Signed)
Preoperative diagnosis: Gastrocutaneous fistula Postoperative diagnosis: Same as above Procedure: 1.  Diagnostic laparoscopy with lysis of adhesions x 30 minutes 2.  Gastric wedge resection of gastrocutaneous fistula Surgeon: Dr. Harden Mo Assistant: Carlena Bjornstad, PA-C Estimated blood loss: 40 cc Specimens: Gastric orientation including fistula to pathology Complications: Drains: Sponge needle count was correct completion Disposition recovery stable condition  Indications: This is a 77 year old male with multiple medical problems to several years ago underwent a laparoscopic gastrostomy tube placement.  He does not need the tube anymore.  It has been out for some time.  He still continues to leak out of his gastrostomy site.  On CT scan he has a gastrocutaneous fistula.  This has been attempted to be closed by gastroenterology endoscopically but has not been successful.  He is on Rituxan and last took that in May at this point.  Due to the persistence of the gastrocutaneous fistula and its appearance both clinically as well as on the CT scan I discussed going to the operating room to excise the fistula.  Procedure: After informed consent was obtained he was taken to the operating room.  He had SCDs in place.  He was placed under general anesthesia without complication.  I removed his dressing for suprapubic tube as well as his colostomy.  These were covered.  He was then prepped and draped in a standard sterile surgical fashion.  A surgical timeout was then performed.  I attempted first to access him in the right upper quadrant under direct vision.  This was not successful due to multiple adhesions in this area.  I then made a small midline incision below his umbilicus.  I carried this to the fascia.  The fascia was incised sharply and the peritoneum entered without injury.  I placed a Hassan trocar and insufflated the abdomen to 15 mmHg pressure.  I then noted he had adhesions really in the  upper half of his abdomen.  On the prior op note had mentioned adhesions lysed for about 15 minutes or so but this is more than what that appeared.  I then placed two 5 mm trocars in the left side of the abdomen and spent some time lysing the adhesions to the abdominal wall.  I was able to identify the gastrostomy site in the stomach that was entering into this.  I examined the right upper quadrant side and there was no evidence of any injury with attempt at that trocar placement.  I then placed a trocar through that site in the right upper quadrant.  I then dissected and lysed adhesions around the gastrostomy site.  I was able to clearly identify the stomach.  There was a fair amount of the stomach that was up in the tract.  I elected at this point to staple across the stomach that was intra-abdominal.  I used a GIA stapler with a green load to then come across the stomach.  This took 2-1/2 fires to get across the stomach.  The staple line was hemostatic.  I then decided to come down anteriorly.  And excised the complete fistula tract.  I made a incision around the fistula tract and cored this out.  I remove the fistula and the remnant stomach that was still inside the abdominal wall.  I then closed this with #1 Novafil.  I eventually also placed some Novafil sutures with the suture passer device as well.  This completely closed this.  I did staple the ends and this was eventually had  a wet-to-dry placed today.  I then used the suture passer device to close the right upper quadrant incision.  This took several 0 Vicryl's.  Once I was done with this I removed the [facility] trocar.  After this I placed a 0 Vicryl pursestring suture and completely obliterated that defect.  The remaining trocars were removed and the abdomen desufflated.  The incisions were then closed with 4 Monocryl and glue.  He tolerated this well was transferred recovery stable condition.

## 2023-05-24 NOTE — Progress Notes (Signed)
  Progress Note   Patient: Brent [name]. UUV:[id number] DOB: Apr 10, 1946 DOA: 05/21/2023     3 DOS: the patient was seen and examined on 05/24/2023   Brief hospital course: 77 y.o. 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  G-tube was initially placed in 2021 when he got diagnosed with his neurological issues, was removed in May 2023 but the gastrocutaneous fistula has failed to heal since then, several attempts to close have been attempted in 2023 and 2024.  His fistula has grown larger and he decided to come to the hospital. General surgery consulted   Assessment and Plan: Principal problem Gastrocutaneous fistula-surgery consulted -CT scan on admission showed a fistula but no intraperitoneal leakage or associated inflammatory changes around it -Pt now s/p surgery 9/4   Active problems Insulin-dependent diabetes mellitus, with hyperglycemia -continue insulin as needed -glucose in the mid-200's -will add 10 units semglee   Essential hypertension -suboptimally controlled, home meds were held while NPO for surgery today -continue home medications   Hyperlipidemia-continue statin   Stage IV sacral ulcer, chronic, POA-wound care consult   Neurogenic bladder, with suprapubic catheter -monitor, currently afebrile, no leukocytosis, do not suspect UTI.  Patient usually gets diffuse bodyaches, malaise when he gets a UTI.  Currently he does not have any symptoms   Depression-continue home medications   Functional quadriplegia secondary to transverse myelitis - noted         Subjective: Pt seen prior to surgery. Feeling eager to proceed today  Physical Exam: Vitals:   05/24/23 1307 05/24/23 1530 05/24/23 1545 05/24/23 1600  BP: (!) 178/66 (!) 166/65 (!) 159/61 (!) 165/64   Pulse: 66 82 80 95  Resp: 17 18 15 18   Temp: 97.7 F (36.5 C) (!) 97.5 F (36.4 C)    TempSrc: Oral     SpO2: 97% 92% 94% 95%  Weight: 96.6 kg     Height: 6' (1.829 m)      General exam: Awake, laying in bed, in nad Respiratory system: Normal respiratory effort, no wheezing Cardiovascular system: regular rate, s1, s2 Gastrointestinal system: Soft, nondistended, positive BS Central nervous system: CN2-12 grossly intact, strength intact Extremities: Perfused, no clubbing Skin: Normal skin turgor, no notable skin lesions seen Psychiatry: Mood normal // no visual hallucinations   Data Reviewed:  Labs reviewed: Na 136, K 3.3, Cr 0.46, WBC 6.4, Hgb 10.3  Family Communication: Pt in room, family not at bedside  Disposition: Status is: Inpatient Remains inpatient appropriate because: severity of illness  Planned Discharge Destination: Home    Author: Rickey Barbara, MD 05/24/2023 4:18 PM  For on call review [email or web address].

## 2023-05-24 NOTE — Inpatient Diabetes Management (Signed)
Inpatient Diabetes Program Recommendations  AACE/ADA: New Consensus Statement on Inpatient Glycemic Control (2015)  Target Ranges:  Prepandial:   less than 140 mg/dL      Peak postprandial:   less than 180 mg/dL (1-2 hours)      Critically ill patients:  140 - 180 mg/dL   Lab Results  Component Value Date   GLUCAP 248 (H) 05/24/2023   HGBA1C 8.0 (H) 05/22/2023    Review of Glycemic Control  Latest Reference Range & Units 05/23/23 22:10 05/24/23 02:32 05/24/23 05:12 05/24/23 08:07 05/24/23 11:27  Glucose-Capillary 70 - 99 mg/dL 409 (H) 811 (H) 914 (H) 266 (H) 248 (H)   Diabetes history: DM  Outpatient Diabetes medications:  Novolog 26 units tid with meals Lantus 42 units bid Metformin 1000 mg bid Current orders for Inpatient glycemic control:  Novolog 0-20 units q 4 hours TPN- 65 units/liter Inpatient Diabetes Program Recommendations:    Note insulin increased in TPN today.  If CBG's remain elevated with change in insulin in TPN bag, consider adding Semglee 10 units bid.  Note patient takes basal insulin at home and meal coverage.  May want to use some basal insulin to cover basal metabolic needs as well and insulin in TPN.   Thanks,  Beryl Meager, RN, BC-ADM Inpatient Diabetes Coordinator Pager [phone]  (8a-5p)

## 2023-05-24 NOTE — Hospital Course (Signed)
77 y.o. 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  G-tube was initially placed in 2021 when he got diagnosed with his neurological issues, was removed in May 2023 but the gastrocutaneous fistula has failed to heal since then, several attempts to close have been attempted in 2023 and 2024.  His fistula has grown larger and he decided to come to the hospital. General surgery consulted

## 2023-05-24 NOTE — Progress Notes (Signed)
PHARMACY - TOTAL PARENTERAL NUTRITION CONSULT NOTE   Indication:  GC Fistula  Patient Measurements: Height: 6' (182.9 cm) Weight: 97 kg (213 lb 13.5 oz) IBW/kg (Calculated) : 77.6 TPN AdjBW (KG): 82.5 Body mass index is 29 kg/m. Usual Weight: 97kg on admission   Assessment:  Patient admitted with worsening of GC fistula. 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 Patient with G-tube placed in 2021 which was then removed in 2023 due to issues with healing. There have been several attempts to close GC fistula in 2023 and 2024. General surgery consulted NPO for now to evaluate fistula output. Depending on clinical course potential for surgical intervention while inpatient in addition to post-pyloric feeds. However per general surgery, will start TPN 9/2.  Noted that MAR states TPN stopped 9/3 0312 - however, I have confirmed with RN 9/4 a.m. that TPN is running with no issues at 40 ml/hr.  CCS thinks fistula unlikely to close on its own and that pt will need surgery. Laparoscopy, possible laparotomy planned for today 9/4.  Glucose / Insulin: A1C 8.0%, patient taking 42 units of insulin glargine and SSI PTA, CBGs 230-250 past 12 hours even with 30 units regular insulin in TPN. Required 50 units SSI/24h.  Electrolytes: K down to 3.3, Mg 1.8, corrected Ca 9.4. Others wnl.   Renal: Scr: <1, BUN wnl Hepatic: LFTs WNL, albumin 2.7, pre-albumin: 19, TG 400 (377 at baseline)  Intake / Output; MIVF: NS @ 10mL/hr, UOP , drain , stool o/p . O/p does not appear to be charted accurately. GI Imaging: 9/1 CT abdomen pelvis: GC fistula shown, trace pleural effusions  GI Surgeries / Procedures:  None   Central access: PICC placed 9/2  TPN start date: 9/2   Nutritional Goals: Goal TPN rate is ~100 mL/hr (providing 130 g of protein and 2300 kcal,  slightly under kcal goal but hope to be able to increase % dextrose and lipids to increase kcal - depending on TG and CBGs)  RD Assessment:  Estimated Needs Total Energy Estimated Needs: 2400-2700kcals Total Protein Estimated Needs: 120-130 gm Total Fluid Estimated Needs: 2.4-2.7 L  Current Nutrition:  NPO + TPN  Plan:  Increase TPN rate to 60 mL/hr at 1800, will increase protein provision without increasing dextrose at this time. TPN will provide 95g AA and 1162 kcal meeting approximately 75% of protein and 50% of calorie needs. Will advance to goal as tolerated - goal CBGs <200 to advance rate. Continue lowered lipid content with TG elevated to 400. Magnesium sulfate 2gm IV  Potassium chloride IV q1h x 4 Electrolytes in TPN: Na 42mEq/L, increase K to 68mEq/L, increase Mg to 9 mEq/L, increase Phos to 20 mmol/L, Ca 16mEq/L. Cl:Ac 1:2 Add standard MVI and trace elements to TPN Continue Resistant q4h SSI at TPN  Increase regular insulin in TPN to 65 units Decrease MIVF to 15 ml/hr when new TPN hangs at 1800. Monitor TPN labs on Mon/Thurs, daily until stable at goal rate  Christoper Fabian, PharmD, BCPS Please see amion for complete clinical pharmacist phone list 05/24/2023,7:51 AM

## 2023-05-24 NOTE — Care Management Important Message (Signed)
Important Message  Patient Details  Name: Brent [name]. MRN: [id number] Date of Birth: 11-25-1945   Medicare Important Message Given:  Yes     Bensen Chadderdon 05/24/2023, 4:16 PM

## 2023-05-24 NOTE — Plan of Care (Signed)

## 2023-05-24 NOTE — Progress Notes (Signed)
[facility] Progress Note     Subjective: CC-  No complaints. No questions after discussions regarding surgery yesterday.  Objective: Vital signs in last 24 hours: Temp:  [97.9 F (36.6 C)-98.2 F (36.8 C)] 98.2 F (36.8 C) (09/04 0727) Pulse Rate:  [62-72] 69 (09/04 0727) Resp:  [17-18] 17 (09/04 0727) BP: (153-187)/(61-72) 153/62 (09/04 0727) SpO2:  [97 %-100 %] 97 % (09/04 0727) Last BM Date : 05/21/23  Intake/Output from previous day: 09/03 0701 - 09/04 0700 In: 1141.6 [P.O.:100; I.V.:1041.6] Out: 1070 [Urine:450; Drains:445; Stool:175] Intake/Output this shift: No intake/output data recorded.  PE: Gen:  Alert, NAD, pleasant Abd: obese, soft, nontender, ostomy viable with brown stool in pouch, LUQ GC fistula with pouch in place/ good seal and thin gastric contents in pouch   Lab Results:  Recent Labs    05/23/23 0811 05/24/23 0329  WBC 5.8 6.4  HGB 11.1* 10.3*  HCT 35.5* 33.1*  PLT 262 265   BMET Recent Labs    05/23/23 0811 05/24/23 0329  NA 136 136  K 3.5 3.3*  CL 103 104  CO2 22 23  GLUCOSE 261* 240*  BUN 7* 8  CREATININE 0.56* 0.46*  CALCIUM 8.8* 8.6*   PT/INR Recent Labs    05/23/23 0811  LABPROT 13.9  INR 1.1   CMP     Component Value Date/Time   NA 136 05/24/2023 0329   NA 140 04/11/2019 1028   K 3.3 (L) 05/24/2023 0329   CL 104 05/24/2023 0329   CO2 23 05/24/2023 0329   GLUCOSE 240 (H) 05/24/2023 0329   BUN 8 05/24/2023 0329   BUN 21 04/11/2019 1028   CREATININE 0.46 (L) 05/24/2023 0329   CREATININE 0.26 (L) 07/23/2020 1052   CALCIUM 8.6 (L) 05/24/2023 0329   PROT 5.5 (L) 05/24/2023 0329   PROT 6.6 01/20/2020 0754   ALBUMIN 2.7 (L) 05/24/2023 0329   ALBUMIN 4.2 03/10/2020 1757   AST 23 05/24/2023 0329   ALT 24 05/24/2023 0329   ALKPHOS 67 05/24/2023 0329   BILITOT 0.6 05/24/2023 0329   BILITOT 0.3 01/20/2020 0754   GFRNONAA >60 05/24/2023 0329   GFRNONAA 87 04/15/2016 1133   GFRAA >60 06/10/2020 0431    GFRAA >89 04/15/2016 1133   Lipase     Component Value Date/Time   LIPASE 20 05/21/2023 1347       Studies/Results: [place] EKG SITE RITE  Result Date: 05/22/2023 If Site Rite image not attached, placement could not be confirmed due to current cardiac rhythm.   Anti-infectives: Anti-infectives (From admission, onward)    None        Assessment/Plan GC Fistula - Planning surgery today pending OR availability. Advised patient that his surgery could get delayed until tomorrow pending emergencies. - Continue NPO, PICC/TPN.    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 Epimenio Foot of [facility] IDDM HTN HLD Neurogenic bladder with SP tube  I reviewed last 24 h vitals and pain scores, last 48 h intake and output, and last 24 h labs and trends.    LOS: 3 days    Franne Forts, [facility] Surgery 05/24/2023, 8:22 AM Please see Amion for pager number during day hours 7:00am-4:30pm

## 2023-05-24 NOTE — Transfer of Care (Signed)
Immediate Anesthesia Transfer of Care Note  Patient: Brent [name].  Procedure(s) Performed: LAPAROSCOPY DIAGNOSTIC (Abdomen) WEDGED RESECTION OF STOMACH AND GASTROCUTANEOUS FISTULA (Abdomen) LYSIS OF ADHESION (Abdomen)  Patient Location: PACU  Anesthesia Type:General  Level of Consciousness: awake, alert , and oriented  Airway & Oxygen Therapy: Patient connected to nasal cannula oxygen  Post-op Assessment: Report given to RN  Post vital signs: Reviewed and stable  Last Vitals:  Vitals Value Taken Time  BP 166/65 05/24/23 1530  Temp    Pulse 79 05/24/23 1533  Resp 16 05/24/23 1533  SpO2 92 % 05/24/23 1533  Vitals shown include unfiled device data.  Last Pain:  Vitals:   05/24/23 1324  TempSrc:   PainSc: 0-No pain         Complications: No notable events documented.

## 2023-05-25 ENCOUNTER — Encounter (HOSPITAL_COMMUNITY): Payer: Self-pay | Admitting: General Surgery

## 2023-05-25 ENCOUNTER — Inpatient Hospital Stay (HOSPITAL_COMMUNITY): Payer: Medicare Other

## 2023-05-25 DIAGNOSIS — K9420 Gastrostomy complication, unspecified: Secondary | ICD-10-CM | POA: Diagnosis not present

## 2023-05-25 DIAGNOSIS — K316 Fistula of stomach and duodenum: Secondary | ICD-10-CM | POA: Diagnosis not present

## 2023-05-25 LAB — MAGNESIUM: Magnesium: 2.1 mg/dL (ref 1.7–2.4)

## 2023-05-25 LAB — COMPREHENSIVE METABOLIC PANEL
ALT: 28 U/L (ref 0–44)
AST: 23 U/L (ref 15–41)
Albumin: 2.9 g/dL — ABNORMAL LOW (ref 3.5–5.0)
Alkaline Phosphatase: 65 U/L (ref 38–126)
Anion gap: 10 (ref 5–15)
BUN: 18 mg/dL (ref 8–23)
CO2: 22 mmol/L (ref 22–32)
Calcium: 8.4 mg/dL — ABNORMAL LOW (ref 8.9–10.3)
Chloride: 102 mmol/L (ref 98–111)
Creatinine, Ser: 0.72 mg/dL (ref 0.61–1.24)
GFR, Estimated: >60 mL/min (ref >60)
Glucose, Bld: 284 mg/dL — ABNORMAL HIGH (ref 70–99)
Potassium: 4.8 mmol/L (ref 3.5–5.1)
Sodium: 134 mmol/L — ABNORMAL LOW (ref 135–145)
Total Bilirubin: 0.5 mg/dL (ref 0.3–1.2)
Total Protein: 5.8 g/dL — ABNORMAL LOW (ref 6.5–8.1)

## 2023-05-25 LAB — GLUCOSE, CAPILLARY
Glucose-Capillary: 272 mg/dL — ABNORMAL HIGH (ref 70–99)
Glucose-Capillary: 262 mg/dL — ABNORMAL HIGH (ref 70–99)
Glucose-Capillary: 256 mg/dL — ABNORMAL HIGH (ref 70–99)
Glucose-Capillary: 250 mg/dL — ABNORMAL HIGH (ref 70–99)
Glucose-Capillary: 267 mg/dL — ABNORMAL HIGH (ref 70–99)
Glucose-Capillary: 242 mg/dL — ABNORMAL HIGH (ref 70–99)

## 2023-05-25 LAB — PHOSPHORUS: Phosphorus: 2.9 mg/dL (ref 2.5–4.6)

## 2023-05-25 LAB — SURGICAL PATHOLOGY

## 2023-05-25 MED ORDER — TRAVASOL 10 % IV SOLN
INTRAVENOUS | Status: AC
  Filled 2023-05-25: qty 950.4

## 2023-05-25 MED ORDER — SENNOSIDES-DOCUSATE SODIUM 8.6-50 MG PO TABS
1.0000 | ORAL_TABLET | Freq: Two times a day (BID) | ORAL | Status: DC
  Administered 2023-05-25 – 2023-05-31 (×14): 1 via ORAL
  Filled 2023-05-25 (×14): qty 1

## 2023-05-25 MED ORDER — INSULIN GLARGINE-YFGN 100 UNIT/ML ~~LOC~~ SOLN
10.0000 [IU] | Freq: Two times a day (BID) | SUBCUTANEOUS | Status: DC
  Administered 2023-05-25 – 2023-05-26 (×3): 10 [IU] via SUBCUTANEOUS
  Filled 2023-05-25 (×4): qty 0.1

## 2023-05-25 MED ORDER — TRAVASOL 10 % IV SOLN
INTRAVENOUS | Status: DC
  Filled 2023-05-25: qty 950.4

## 2023-05-25 NOTE — Progress Notes (Signed)
[facility] Progress Note  1 Day Post-Op  Subjective: CC-  Some nausea over night, no emesis. Nausea improved this morning. No colostomy output. Having some abdominal pain, pain medication helps.   Objective: Vital signs in last 24 hours: Temp:  [97.5 F (36.4 C)-98.4 F (36.9 C)] 98.2 F (36.8 C) (09/05 0753) Pulse Rate:  [66-95] 92 (09/05 0753) Resp:  [15-18] 17 (09/05 0445) BP: (128-178)/(49-67) 139/49 (09/05 0753) SpO2:  [91 %-97 %] 93 % (09/05 0753) Weight:  [96.6 kg-97.2 kg] 97.2 kg (09/05 0500) Last BM Date : 05/21/23  Intake/Output from previous day: 09/04 0701 - 09/05 0700 In: 961.5 [P.O.:100; I.V.:601.5; IV Piggyback:260] Out: 1133 [Urine:1130; Blood:3] Intake/Output this shift: No intake/output data recorded.  PE: Gen:  Alert, NAD, pleasant Abd: soft, distended, hypoactive bowel sounds, appropriately tender, lap incisions cdi with steri strips present, LUQ open wound at prior fistula site with some surrounding erythema/ no purulent drainage  Lab Results:  Recent Labs    05/23/23 0811 05/24/23 0329  WBC 5.8 6.4  HGB 11.1* 10.3*  HCT 35.5* 33.1*  PLT 262 265   BMET Recent Labs    05/24/23 0329 05/25/23 0409  NA 136 134*  K 3.3* 4.8  CL 104 102  CO2 23 22  GLUCOSE 240* 284*  BUN 8 18  CREATININE 0.46* 0.72  CALCIUM 8.6* 8.4*   PT/INR Recent Labs    05/23/23 0811  LABPROT 13.9  INR 1.1   CMP     Component Value Date/Time   NA 134 (L) 05/25/2023 0409   NA 140 04/11/2019 1028   K 4.8 05/25/2023 0409   CL 102 05/25/2023 0409   CO2 22 05/25/2023 0409   GLUCOSE 284 (H) 05/25/2023 0409   BUN 18 05/25/2023 0409   BUN 21 04/11/2019 1028   CREATININE 0.72 05/25/2023 0409   CREATININE 0.26 (L) 07/23/2020 1052   CALCIUM 8.4 (L) 05/25/2023 0409   PROT 5.8 (L) 05/25/2023 0409   PROT 6.6 01/20/2020 0754   ALBUMIN 2.9 (L) 05/25/2023 0409   ALBUMIN 4.2 03/10/2020 1757   AST 23 05/25/2023 0409   ALT 28 05/25/2023 0409   ALKPHOS 65  05/25/2023 0409   BILITOT 0.5 05/25/2023 0409   BILITOT 0.3 01/20/2020 0754   GFRNONAA >60 05/25/2023 0409   GFRNONAA 87 04/15/2016 1133   GFRAA >60 06/10/2020 0431   GFRAA >89 04/15/2016 1133   Lipase     Component Value Date/Time   LIPASE 20 05/21/2023 1347       Studies/Results: PERIPHERAL VASCULAR CATHETERIZATION  Result Date: 05/24/2023 See surgical note for result.   Anti-infectives: Anti-infectives (From admission, onward)    None        Assessment/Plan GC Fistula -POD#1 s/p Diagnostic laparoscopy with lysis of adhesions x 30 minutes, Gastric wedge resection of gastrocutaneous fistula 9/4 Dr. Dwain Sarna - Continue NPO excepts sips/chips and await return in bowel function. Reordered home senokot. 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 Epimenio Foot of [facility] IDDM HTN HLD Neurogenic bladder with SP tube    LOS: 4 days    Brent [name], [facility] 05/25/2023, 8:55 AM Please see Amion for pager number during day hours 7:00am-4:30pm

## 2023-05-25 NOTE — Progress Notes (Addendum)
PHARMACY - TOTAL PARENTERAL NUTRITION CONSULT NOTE   Indication:  GC Fistula  Patient Measurements: Height: 6' (182.9 cm) Weight: 97.2 kg (214 lb 5.7 oz) IBW/kg (Calculated) : 77.6 TPN AdjBW (KG): 96.6 Body mass index is 29.07 kg/m. Usual Weight: 97kg on admission   Assessment:  Patient admitted with worsening of GC fistula. 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 Patient with G-tube placed in 2021 which was then removed in 2023 due to issues with healing. There have been several attempts to close GC fistula in 2023 and 2024. General surgery consulted NPO for now to evaluate fistula output. Depending on clinical course potential for surgical intervention while inpatient in addition to post-pyloric feeds. However per general surgery, will start TPN 9/2.  Noted that MAR states TPN stopped 9/3 0312 - however, I have confirmed with RN 9/4 a.m. that TPN is running with no issues at 40 ml/hr.  Glucose / Insulin: A1C 8.0%, patient taking 42 units of insulin glargine and SSI PTA, CBGs >250 past 12 hours even with 65 units regular insulin in TPN + addition of semglee 10 units daily. Required 51 units SSI/24h.  Electrolytes: Na 134, K up to 4.8 (post repletion yesterday), corr Ca 9.3.   Renal: Scr <1, BUN wnl Hepatic: LFTs WNL, albumin 2.9, pre-albumin 19 on 9/2, TG 400 (377 at baseline)  Intake / Output; MIVF: NS @ 35mL/hr, UOP , stool not charted. GI Imaging: 9/1 CT abdomen pelvis: GC fistula shown, trace pleural effusions  GI Surgeries / Procedures:  9/4 laparoscopy with LOA, gastric wedge resection of GC fistula  Central access: PICC placed 9/2  TPN start date: 9/2   Nutritional Goals: Goal TPN rate is ~100 mL/hr (goal formula AA 54 g/L, dextrose 15%, lipids 27 g/L to provide 130 g of protein and 2390 kcal)  RD Assessment:  Estimated  Needs Total Energy Estimated Needs: 2400-2700kcals Total Protein Estimated Needs: 120-130 gm Total Fluid Estimated Needs: 2.4-2.7 L  Current Nutrition:  NPO + TPN  Plan:  Continue TPN at 60 mL/hr.  TPN will provide 95g AA and 1162 kcal meeting approximately 75% of protein and 50% of calorie needs. Will advance to goal as tolerated - goal CBGs mostly <200 to advance rate further.  Continue lowered lipid content with TG elevated to 400 and decreased dextrose content with CBGs so elevated. Electrolytes in TPN: Increase Na to 139mEq/L, decrease K back to 40mEq/L, Mg 9 mEq/L, Phos 20 mmol/L, Ca 70mEq/L. Cl:Ac 1:2 Add standard MVI and trace elements to TPN Continue Resistant q4h SSI at TPN. Noted semglee 10 units daily added 9/4 pm - discussed with Dr. Rhona Leavens and will change this to 10 units BID. Increase regular insulin in TPN to 94 units (this is max allowed in TPN of 65 units/L) Continue MIVF at 15 ml/hr. Monitor TPN labs on Mon/Thurs once stable F/u TG, BMET, Mg, Phos in a.m.  Christoper Fabian, PharmD, BCPS Please see amion for complete clinical pharmacist phone list 05/25/2023,8:07 AM

## 2023-05-25 NOTE — Progress Notes (Signed)
  Progress Note   Patient: Brent [name]. WJX:[id number] DOB: Oct 17, 1945 DOA: 05/21/2023     4 DOS: the patient was seen and examined on 05/25/2023   Brief hospital course: 77 y.o. 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  G-tube was initially placed in 2021 when he got diagnosed with his neurological issues, was removed in May 2023 but the gastrocutaneous fistula has failed to heal since then, several attempts to close have been attempted in 2023 and 2024.  His fistula has grown larger and he decided to come to the hospital. General surgery consulted   Assessment and Plan: Principal problem Gastrocutaneous fistula-surgery consulted -CT scan on admission showed a fistula but no intraperitoneal leakage or associated inflammatory changes around it -Pt now s/p surgery 9/4, currently awaiting return of bowel function   Active problems Insulin-dependent diabetes mellitus, with hyperglycemia -continue insulin as needed -glucose remains in the mid-200's -will increase semglee to 10 units BID   Essential hypertension -suboptimally controlled, home meds were held while NPO for surgery today -continue home medications   Hyperlipidemia-continue statin   Stage IV sacral ulcer, chronic, POA-wound care consult   Neurogenic bladder, with suprapubic catheter -monitor, currently afebrile, no leukocytosis, do not suspect UTI.  Patient usually gets diffuse bodyaches, malaise when he gets a UTI.  Currently he does not have any symptoms   Depression-continue home medications   Functional quadriplegia secondary to transverse myelitis - noted         Subjective: Without complaints this AM  Physical Exam: Vitals:   05/25/23 0445 05/25/23 0500 05/25/23 0753 05/25/23 1455  BP: (!) 128/49   (!) 139/49 (!) 137/55  Pulse: 91  92 87  Resp: 17     Temp: 97.9 F (36.6 C)  98.2 F (36.8 C) 99.5 F (37.5 C)  TempSrc:   Oral Oral  SpO2: 91%  93% 94%  Weight:  97.2 kg    Height:       General exam: Conversant, in no acute distress Respiratory system: normal chest rise, clear, no audible wheezing Cardiovascular system: regular rhythm, s1-s2 Gastrointestinal system: Nondistended, nontender, decreased bs Central nervous system: No seizures, no tremors Extremities: No cyanosis, no joint deformities Skin: No rashes, no pallor Psychiatry: Affect normal // no auditory hallucinations   Data Reviewed:  Labs reviewed: Na 134, K 4.8, Cr 0.72   Family Communication: Pt in room, family not at bedside  Disposition: Status is: Inpatient Remains inpatient appropriate because: severity of illness  Planned Discharge Destination: Home    Author: Rickey Barbara, MD 05/25/2023 3:26 PM  For on call review [email or web address].

## 2023-05-25 NOTE — Plan of Care (Signed)

## 2023-05-25 NOTE — Progress Notes (Signed)
Nutrition Follow-up  DOCUMENTATION CODES:   Not applicable  INTERVENTION:  - Continue TPN via PICC per pharmacy management.   NUTRITION DIAGNOSIS:   Inadequate oral intake related to inability to eat as evidenced by NPO status.  GOAL:   Provide needs based on ASPEN/SCCM guidelines  MONITOR:   I & O's, Labs  REASON FOR ASSESSMENT:   Consult New TPN/TNA  ASSESSMENT:   77 y.o. male admits related to gastrocutaneous fistula. 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  Meds reviewed: lipitor, pepcid, sliding scale insulin, semglee, cozaar, senokot. Labs reviewed: Na low. Poor BG control. FS BG 230-293 mg/dL.   Pt is POD1 from diagnostic laparoscopy with lysis of adhesions x 30 minutes, Gastric wedge resection of gastrocutaneous fistula. Pt remains NPO at this time. TPN is patient's sole source of nutrition. TPN is not yet meeting the pt's needs. RD will continue to monitor diet advancement.  Diet Order:   Diet Order             Diet NPO time specified Except for: Sips with Meds, Ice Chips  Diet effective now                   EDUCATION NEEDS:   Not appropriate for education at this time  Skin:  Skin Assessment: Skin Integrity Issues: Skin Integrity Issues:: Stage III Stage III: bilateral buttocks  Last BM:     Height:   Ht Readings from Last 1 Encounters:  05/24/23 6' (1.829 m)    Weight:   Wt Readings from Last 1 Encounters:  05/25/23 97.2 kg    Ideal Body Weight:     BMI:  Body mass index is 29.07 kg/m.  Estimated Nutritional Needs:   Kcal:  2400-2700kcals  Protein:  120-130 gm  Fluid:  2.4-2.7 L  Bethann Humble, RD, LDN, CNSC.

## 2023-05-25 NOTE — TOC Initial Note (Signed)
Transition of Care (TOC) - Initial/Assessment Note    Patient Details  Name: Brent [name]. MRN: [id number] Date of Birth: 1946-04-16  Transition of [facility] CM/SW Contact:    Adelard Sanon A [place], Theresia Majors Phone Number: 05/25/2023, 4:53 PM  Clinical Narrative:                  CSW made attempt to contact pt to complete assessment. Pt was unable to be roused and sleeping soundly. CSW contacted pt's wife, [name], there was no answer and CSW was unable to leave VM. CSW contacted [facility] [place], 2x  and left secure message via text.  Assessment completed via chart review.   Pt is from [facility]. CSW to confirm is pt can return, appears pt is there for long term care.   DC pending medical readiness.   TOC will continue to follow.   Expected Discharge Plan: Skilled Nursing Facility Barriers to Discharge: Continued Medical Work up   Patient Goals and CMS Choice            Expected Discharge Plan and Services       Living arrangements for the past 2 months: Skilled Nursing Facility                                      Prior Living Arrangements/Services Living arrangements for the past 2 months: Skilled Nursing Facility Lives with:: Facility Resident          Need for Family Participation in Patient Care: Yes (Comment) Care giver support system in place?: Yes (comment)      Activities of Daily Living Home Assistive Devices/Equipment: Wheelchair ADL Screening (condition at time of admission) Patient's cognitive ability adequate to safely complete daily activities?: Yes Is the patient deaf or have difficulty hearing?: No Does the patient have difficulty seeing, even when wearing glasses/contacts?: No Does the patient have difficulty concentrating, remembering, or making decisions?: No Patient able to express need for assistance with ADLs?: Yes Does the patient have difficulty dressing or bathing?: Yes Independently performs ADLs?: No Communication:  Dependent Is this a change from baseline?: Pre-admission baseline Dressing (OT): Dependent Is this a change from baseline?: Pre-admission baseline Grooming: Dependent Is this a change from baseline?: Pre-admission baseline Feeding: Dependent Is this a change from baseline?: Pre-admission baseline Bathing: Dependent Is this a change from baseline?: Pre-admission baseline Toileting: Dependent Is this a change from baseline?: Pre-admission baseline In/Out Bed: Dependent Is this a change from baseline?: Pre-admission baseline Walks in Home: Dependent Is this a change from baseline?: Pre-admission baseline Does the patient have difficulty walking or climbing stairs?: Yes Weakness of Legs: Both Weakness of Arms/Hands: Both  Permission Sought/Granted                  Emotional Assessment Appearance:: Appears older than stated age Attitude/Demeanor/Rapport: Unable to Assess Affect (typically observed): Unable to Assess Orientation: : Oriented to Self Alcohol / Substance Use: Not Applicable Psych Involvement: No (comment)  Admission diagnosis:  Gastrocutaneous fistula [K31.6] Complication of feeding tube (HCC) [K94.20] Patient Active Problem List   Diagnosis Date Noted   Dehydration 02/21/2023   Sepsis (HCC) 02/21/2023   Acute metabolic encephalopathy 02/21/2023   Lactic acidosis 02/21/2023   Type 2 diabetes mellitus with complication, with long-term current use of insulin (HCC) 02/21/2023   Normocytic anemia 02/21/2023   Primary malignant neuroendocrine tumor of stomach (HCC) 05/27/2022  Preoperative examination 05/27/2022   Abnormal endoscopy of upper gastrointestinal tract 05/27/2022   Benign neuroendocrine tumor of stomach  04/04/2022   Mucosal abnormality of stomach    Chronic osteomyelitis of pelvic region, right (HCC) 03/25/2022   Gastrocutaneous fistula 03/25/2022   Complicated urinary tract infection    Catheter-associated urinary tract infection (HCC)  08/06/2021   Osteomyelitis of pelvic region, acute, right (HCC) 03/24/2021   Right ischial pressure sore 02/10/2021   Hypokalemia    Encounter for monitoring immunomodulating therapy 08/20/2020   High risk medication use 08/20/2020   Malnutrition of moderate degree 06/04/2020   Palliative care by specialist    Goals of care, counseling/discussion    FTT (failure to thrive) in adult 06/01/2020   Abdominal pain    Sacral ulcer, with necrosis of muscle (HCC) 04/09/2020   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HCC)    Transverse myelitis (HCC)    Labile blood pressure    Labile blood glucose    Neurogenic bladder    Neurogenic bowel    Transaminitis    Controlled type 2 diabetes mellitus with hyperglycemia, with long-term current use of insulin (HCC)    Quadriplegia (HCC)    Dyslipidemia    Paraplegia (HCC)    Coronary artery disease involving native coronary artery of native heart without angina pectoris    Steroid-induced hyperglycemia    Diabetic peripheral neuropathy (HCC)    Weakness 03/09/2020   Chest pain 03/08/2020   Right leg numbness    Malignant melanoma of left side of neck (HCC) 10/25/2018   Facial neuritis 10/25/2018   Myofascial pain syndrome 10/25/2018   Occipital neuralgia of left side 10/25/2018   Male hypogonadism 06/20/2016   Renal lesion 06/20/2016   Insomnia 08/07/2015   Enlarged prostate with lower urinary tract symptoms (LUTS) 11/07/2014   Balanitis 07/30/2012   Bladder neck obstruction 07/30/2012   Prostate nodule 06/18/2012   Recurrent nephrolithiasis 06/18/2012   Benign neoplasm of colon 08/29/2011   DEPRESSION, SITUATIONAL, ACUTE 01/23/2010   Essential hypertension 05/30/2009   Coronary atherosclerosis 05/30/2009   Obstructive sleep apnea 11/28/2008   OBESITY 09/23/2008   ERECTILE DYSFUNCTION 11/26/2007   Well controlled type 2 diabetes mellitus with peripheral circulatory disorder (HCC) 05/09/2007   Hyperlipidemia 05/09/2007   MYOCARDIAL INFARCTION, HX OF  05/09/2007   DIVERTICULOSIS, COLON 05/09/2007   PCP:  [name], MD Pharmacy:  No Pharmacies Listed    Social Determinants of Health (SDOH) Social History: SDOH Screenings   Food Insecurity: No Food Insecurity (05/21/2023)  Housing: Low Risk  (05/21/2023)  Transportation Needs: No Transportation Needs (05/21/2023)  Utilities: Not At Risk (05/21/2023)  Depression (PHQ2-9): Low Risk  (12/09/2020)  Financial Resource Strain: Low Risk  (03/06/2020)  Physical Activity: Inactive (12/14/2018)  Social Connections: Unknown (12/14/2018)  Stress: No Stress Concern Present (12/14/2018)  Tobacco Use: Medium Risk (05/24/2023)   SDOH Interventions:     Readmission Risk Interventions     No data to display

## 2023-05-25 NOTE — Progress Notes (Signed)
Found dressing to be off completely on medial side. Changed dressing without incident. Primary rn notified

## 2023-05-26 DIAGNOSIS — K9420 Gastrostomy complication, unspecified: Secondary | ICD-10-CM | POA: Diagnosis not present

## 2023-05-26 DIAGNOSIS — K316 Fistula of stomach and duodenum: Secondary | ICD-10-CM | POA: Diagnosis not present

## 2023-05-26 LAB — PHOSPHORUS: Phosphorus: 2.4 mg/dL — ABNORMAL LOW (ref 2.5–4.6)

## 2023-05-26 LAB — CBC WITH DIFFERENTIAL/PLATELET
Abs Immature Granulocytes: 0.07 10*3/uL (ref 0.00–0.07)
Basophils Absolute: 0 10*3/uL (ref 0.0–0.1)
Basophils Relative: 0 %
Eosinophils Absolute: 0.1 10*3/uL (ref 0.0–0.5)
Eosinophils Relative: 1 %
HCT: 33.6 % — ABNORMAL LOW (ref 39.0–52.0)
Hemoglobin: 10.1 g/dL — ABNORMAL LOW (ref 13.0–17.0)
Immature Granulocytes: 1 %
Lymphocytes Relative: 11 %
Lymphs Abs: 1.2 10*3/uL (ref 0.7–4.0)
MCH: 26.6 pg (ref 26.0–34.0)
MCHC: 30.1 g/dL (ref 30.0–36.0)
MCV: 88.4 fL (ref 80.0–100.0)
Monocytes Absolute: 1.5 10*3/uL — ABNORMAL HIGH (ref 0.1–1.0)
Monocytes Relative: 14 %
Neutro Abs: 7.9 10*3/uL — ABNORMAL HIGH (ref 1.7–7.7)
Neutrophils Relative %: 73 %
Platelets: 281 10*3/uL (ref 150–400)
RBC: 3.8 MIL/uL — ABNORMAL LOW (ref 4.22–5.81)
RDW: 16.6 % — ABNORMAL HIGH (ref 11.5–15.5)
WBC: 10.7 10*3/uL — ABNORMAL HIGH (ref 4.0–10.5)
nRBC: 0 % (ref 0.0–0.2)

## 2023-05-26 LAB — GLUCOSE, CAPILLARY
Glucose-Capillary: 238 mg/dL — ABNORMAL HIGH (ref 70–99)
Glucose-Capillary: 259 mg/dL — ABNORMAL HIGH (ref 70–99)
Glucose-Capillary: 260 mg/dL — ABNORMAL HIGH (ref 70–99)
Glucose-Capillary: 265 mg/dL — ABNORMAL HIGH (ref 70–99)
Glucose-Capillary: 266 mg/dL — ABNORMAL HIGH (ref 70–99)
Glucose-Capillary: 267 mg/dL — ABNORMAL HIGH (ref 70–99)
Glucose-Capillary: 272 mg/dL — ABNORMAL HIGH (ref 70–99)
Glucose-Capillary: 278 mg/dL — ABNORMAL HIGH (ref 70–99)

## 2023-05-26 LAB — TRIGLYCERIDES: Triglycerides: 287 mg/dL — ABNORMAL HIGH (ref <150)

## 2023-05-26 LAB — BASIC METABOLIC PANEL
Anion gap: 12 (ref 5–15)
BUN: 22 mg/dL (ref 8–23)
CO2: 23 mmol/L (ref 22–32)
Calcium: 8.7 mg/dL — ABNORMAL LOW (ref 8.9–10.3)
Chloride: 101 mmol/L (ref 98–111)
Creatinine, Ser: 0.71 mg/dL (ref 0.61–1.24)
GFR, Estimated: >60 mL/min (ref >60)
Glucose, Bld: 269 mg/dL — ABNORMAL HIGH (ref 70–99)
Potassium: 4.5 mmol/L (ref 3.5–5.1)
Sodium: 136 mmol/L (ref 135–145)

## 2023-05-26 LAB — MAGNESIUM: Magnesium: 2 mg/dL (ref 1.7–2.4)

## 2023-05-26 MED ORDER — INSULIN GLARGINE-YFGN 100 UNIT/ML ~~LOC~~ SOLN
20.0000 [IU] | Freq: Two times a day (BID) | SUBCUTANEOUS | Status: DC
  Administered 2023-05-26 – 2023-05-27 (×2): 20 [IU] via SUBCUTANEOUS
  Filled 2023-05-26 (×3): qty 0.2

## 2023-05-26 MED ORDER — TRAMADOL HCL 50 MG PO TABS
50.0000 mg | ORAL_TABLET | Freq: Two times a day (BID) | ORAL | Status: DC | PRN
  Administered 2023-05-29: 50 mg via ORAL
  Filled 2023-05-26 (×2): qty 1

## 2023-05-26 MED ORDER — TRAVASOL 10 % IV SOLN
INTRAVENOUS | Status: AC
  Filled 2023-05-26: qty 950.4

## 2023-05-26 MED ORDER — SODIUM PHOSPHATES 45 MMOLE/15ML IV SOLN
15.0000 mmol | Freq: Once | INTRAVENOUS | Status: AC
  Administered 2023-05-26: 15 mmol via INTRAVENOUS
  Filled 2023-05-26: qty 5

## 2023-05-26 NOTE — Plan of Care (Signed)
A/ox3-4 and on room air. PRN tramadol given x1. Total care provided. LUQ, sacral, and suprapubic dressings all changed this shift. CHG bath given, and linen changed. Patient stated in middle of night that he felt worse than he did earlier, also had slightly elevated temp of 100.1. notified cross cover, Dr. Joneen Roach, and CBC added to am labs.    Problem: Education: Goal: Ability to describe self-care measures that may prevent or decrease complications (Diabetes Survival Skills Education) will improve Outcome: Progressing Goal: Individualized Educational Video(s) Outcome: Progressing   Problem: Coping: Goal: Ability to adjust to condition or change in health will improve Outcome: Progressing   Problem: Fluid Volume: Goal: Ability to maintain a balanced intake and output will improve Outcome: Progressing   Problem: Health Behavior/Discharge Planning: Goal: Ability to identify and utilize available resources and services will improve Outcome: Progressing Goal: Ability to manage health-related needs will improve Outcome: Progressing   Problem: Metabolic: Goal: Ability to maintain appropriate glucose levels will improve Outcome: Progressing   Problem: Nutritional: Goal: Maintenance of adequate nutrition will improve Outcome: Progressing Goal: Progress toward achieving an optimal weight will improve Outcome: Progressing   Problem: Skin Integrity: Goal: Risk for impaired skin integrity will decrease Outcome: Progressing   Problem: Tissue Perfusion: Goal: Adequacy of tissue perfusion will improve Outcome: Progressing   Problem: Education: Goal: Knowledge of General Education information will improve Description: Including pain rating scale, medication(s)/side effects and non-pharmacologic comfort measures Outcome: Progressing   Problem: Health Behavior/Discharge Planning: Goal: Ability to manage health-related needs will improve Outcome: Progressing   Problem: Clinical  Measurements: Goal: Ability to maintain clinical measurements within normal limits will improve Outcome: Progressing Goal: Will remain free from infection Outcome: Progressing Goal: Diagnostic test results will improve Outcome: Progressing Goal: Respiratory complications will improve Outcome: Progressing Goal: Cardiovascular complication will be avoided Outcome: Progressing   Problem: Activity: Goal: Risk for activity intolerance will decrease Outcome: Progressing   Problem: Nutrition: Goal: Adequate nutrition will be maintained Outcome: Progressing   Problem: Coping: Goal: Level of anxiety will decrease Outcome: Progressing   Problem: Elimination: Goal: Will not experience complications related to bowel motility Outcome: Progressing Goal: Will not experience complications related to urinary retention Outcome: Progressing   Problem: Pain Managment: Goal: General experience of comfort will improve Outcome: Progressing   Problem: Safety: Goal: Ability to remain free from injury will improve Outcome: Progressing   Problem: Skin Integrity: Goal: Risk for impaired skin integrity will decrease Outcome: Progressing

## 2023-05-26 NOTE — Discharge Instructions (Addendum)
Wet to Dry WOUND CARE: - Change left upper quadrant dressing twice daily - Supplies: sterile saline, gauze, scissors, tape  Remove dressing and all packing carefully, moistening with sterile saline as needed to avoid packing/internal dressing sticking to the wound. 2.   Clean edges of skin around the wound with water/gauze, making sure there is no tape debris or leakage left on skin that could cause skin irritation or breakdown. 3.   Dampen a clean gauze with saline and pack wound from wound base to skin level, making sure to take note of any possible areas of wound tracking, tunneling and packing appropriately. Wound can be packed loosely. Trim gauze to size if a whole kerlex is not required. 4.   Cover wound with a dry gauze and secure with tape.  5.   Write the date/time on the dry dressing/tape to better track when the last dressing change occurred. - change dressing as needed if leakage occurs, wound gets contaminated, or patient requests to shower. - You may shower daily with wound open and following the shower the wound should be dried and a clean dressing placed.  - Medical grade tape as well as packing supplies can be found at [employer] on [address] or [employer] on [address]. The remaining supplies can be found at your local drug store, [employer] etc.   RIGHT UPPER QUADRANT WOUND - change daily. Remove packing. Using a Q-tip, insert a wick of 1/4 " iodoform gauze. Cover with dry gauze and tape.    [facility]  Please arrive at least 30 min before your appointment to complete your check in paperwork.  If you are unable to arrive 30 min prior to your appointment time we may have to cancel or reschedule you. LAPAROSCOPIC SURGERY: POST OP INSTRUCTIONS Always review your discharge instruction sheet given to you by the facility where your surgery was performed. IF YOU HAVE DISABILITY OR FAMILY LEAVE FORMS, YOU MUST BRING THEM TO THE  OFFICE FOR PROCESSING.   DO NOT GIVE THEM TO YOUR DOCTOR.  PAIN CONTROL  First take acetaminophen (Tylenol) AND/or ibuprofen (Advil) to control your pain after surgery.  Follow directions on package.  Taking acetaminophen (Tylenol) and/or ibuprofen (Advil) regularly after surgery will help to control your pain and lower the amount of prescription pain medication you may need.  You should not take more than 4,000 mg (4 grams) of acetaminophen (Tylenol) in 24 hours.  You should not take ibuprofen (Advil), aleve, motrin, naprosyn or other NSAIDS if you have a history of stomach ulcers or chronic kidney disease.  A prescription for pain medication may be given to you upon discharge.  Take your pain medication as prescribed, if you still have uncontrolled pain after taking acetaminophen (Tylenol) or ibuprofen (Advil). Use ice packs to help control pain. If you need a refill on your pain medication, please contact your pharmacy.  They will contact our office to request authorization. Prescriptions will not be filled after 5pm or on week-ends.  HOME MEDICATIONS Take your usually prescribed medications unless otherwise directed.  DIET You should follow a light diet the first few days after arrival home.  Be sure to include lots of fluids daily. Avoid fatty, fried foods.   CONSTIPATION It is common to experience some constipation after surgery and if you are taking pain medication.  Increasing fluid intake and taking a stool softener (such as Colace) will usually help or prevent this problem from occurring.  A mild  laxative (Milk of Magnesia or Miralax) should be taken according to package instructions if there are no bowel movements after 48 hours.  WOUND/INCISION CARE Most patients will experience some swelling and bruising in the area of the incisions.  Ice packs will help.  Swelling and bruising can take several days to resolve.  Unless discharge instructions indicate otherwise, follow guidelines  below  STERI-STRIPS - you may remove your outer bandages 48 hours after surgery, and you may shower at that time.  You have steri-strips (small skin tapes) in place directly over the incision.  These strips should be left on the skin for 7-10 days.   DERMABOND/SKIN GLUE - you may shower in 24 hours.  The glue will flake off over the next 2-3 weeks. Any sutures or staples will be removed at the office during your follow-up visit.  ACTIVITIES You may resume regular (light) daily activities beginning the next day--such as daily self-care, walking, climbing stairs--gradually increasing activities as tolerated.  You may have sexual intercourse when it is comfortable.  Refrain from any heavy lifting or straining until approved by your doctor. You may drive when you are no longer taking prescription pain medication, you can comfortably wear a seatbelt, and you can safely maneuver your car and apply brakes.  FOLLOW-UP You should see your doctor in the office for a follow-up appointment approximately 2-3 weeks after your surgery.  You should have been given your post-op/follow-up appointment when your surgery was scheduled.  If you did not receive a post-op/follow-up appointment, make sure that you call for this appointment within a day or two after you arrive home to insure a convenient appointment time.  OTHER INSTRUCTIONS  WHEN TO CALL YOUR DOCTOR: Fever over 101.0 Inability to urinate Continued bleeding from incision. Increased pain, redness, or drainage from the incision. Increasing abdominal pain  The clinic staff is available to answer your questions during regular business hours.  Please don't hesitate to call and ask to speak to one of the nurses for clinical concerns.  If you have a medical emergency, go to the nearest emergency room or call 911.  A surgeon from [facility] is always on call at the hospital. [address], [place], [place]  [address] ? [address]  [address], [place], [place]   [address] [phone] ? [phone] ? FAX [phone]

## 2023-05-26 NOTE — Progress Notes (Signed)
PHARMACY - TOTAL PARENTERAL NUTRITION CONSULT NOTE   Indication:  GC Fistula  Patient Measurements: Height: 6' (182.9 cm) Weight: 97.2 kg (214 lb 5.7 oz) IBW/kg (Calculated) : 77.6 TPN AdjBW (KG): 96.6 Body mass index is 29.07 kg/m. Usual Weight: 97kg on admission   Assessment:  Patient admitted with worsening of GC fistula. 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 Patient with G-tube placed in 2021 which was then removed in 2023 due to issues with healing. There have been several attempts to close GC fistula in 2023 and 2024. General surgery consulted NPO for now to evaluate fistula output. Depending on clinical course potential for surgical intervention while inpatient in addition to post-pyloric feeds. However per general surgery, will start TPN 9/2.  Noted that MAR states TPN stopped 9/3 0312 - however, I have confirmed with RN 9/4 a.m. that TPN is running with no issues at 40 ml/hr.  Glucose / Insulin: A1C 8.0%, patient taking 42 units of insulin glargine and SSI PTA, CBGs >250 past 24 hours even with 94 units regular insulin in TPN + addition of semglee 20 units daily. Required 58 units SSI/24h.  Electrolytes: Na 136, K 4.5, Phos 2.4, Mag 2.0, CoCa 9.58   Renal: Scr <1, BUN wnl Hepatic: LFTs WNL, albumin 2.9, pre-albumin 19 on 9/2, TG 287 (377 at baseline)  Intake / Output; MIVF: NS @ 71mL/hr, UOP not charted for 9/5. 775 mL total on 9/6 @ 09:00, no OP from colostomy per DM note LBM 9/6 GI Imaging: 9/1 CT abdomen pelvis: GC fistula shown, trace pleural effusions 9/5 Abd portable gas-filled loops small bowel in mid and right abd. Nonspecific but could represent post-op ileus  GI Surgeries / Procedures:  9/4 laparoscopy with LOA, gastric wedge resection of GC fistula  Central access: PICC placed 9/2  TPN start date: 9/2   Nutritional  Goals: Goal TPN rate is ~100 mL/hr (goal formula AA 54 g/L, dextrose 15%, lipids 27 g/L to provide 130 g of protein and 2390 kcal)  RD Assessment:  Estimated Needs Total Energy Estimated Needs: 2400-2700kcals Total Protein Estimated Needs: 120-130 gm Total Fluid Estimated Needs: 2.4-2.7 L  Current Nutrition:  NPO + TPN  Plan:  Continue TPN at 60 mL/hr.  TPN will provide 95g AA and 1207 kcal meeting approximately 75% of protein and 55% of calorie needs. Will advance to goal as tolerated - goal CBGs mostly <200 to advance rate further.  TG have fallen to the high 200's. I will advance lipid content to help meet nutritional needs (increased by 3 grams/L) and continue decreased dextrose content with CBGs so elevated. Electrolytes in TPN: Na to 174mEq/L, K 84mEq/L, Mg 9 mEq/L, Phos 25 mmol/L, Ca 6mEq/L. Cl:Ac 1:2 Add standard MVI and trace elements to TPN Continue Resistant q4h SSI at TPN. Noted semglee changed to 10 units BID 9/5 Continue regular insulin in TPN to 94 units (this is max allowed in TPN of 65 units/L) Na Phos 15 mmol iv x1 today Continue MIVF at 15 ml/hr. Monitor TPN labs on Mon/Thurs once stable F/u Phos in a.m.   Greta Doom BS, PharmD, BCPS Clinical Pharmacist 05/26/2023 9:35 AM  Contact: [phone] after 3 PM  "Be curious, not judgmental..." -Debbora Dus

## 2023-05-26 NOTE — TOC Progression Note (Signed)
Transition of Care (TOC) - Progression Note    Patient Details  Name: Brent [name]. MRN: [id number] Date of Birth: 1945-10-28  Transition of [facility]) CM/SW Contact  Iviona Hole A [place], [place] Phone Number: 05/26/2023, 1:48 PM  Clinical Narrative:     CSW was notified by Star at [facility] that pt is able to DC back to facility whenever medically stable.   TOC will continue to follow.   Expected Discharge Plan: Skilled Nursing Facility Barriers to Discharge: Continued Medical Work up  Expected Discharge Plan and Services       Living arrangements for the past 2 months: Skilled Nursing Facility                                       Social Determinants of Health (SDOH) Interventions SDOH Screenings   Food Insecurity: No Food Insecurity (05/21/2023)  Housing: Low Risk  (05/21/2023)  Transportation Needs: No Transportation Needs (05/21/2023)  Utilities: Not At Risk (05/21/2023)  Depression (PHQ2-9): Low Risk  (12/09/2020)  Financial Resource Strain: Low Risk  (03/06/2020)  Physical Activity: Inactive (12/14/2018)  Social Connections: Unknown (12/14/2018)  Stress: No Stress Concern Present (12/14/2018)  Tobacco Use: Medium Risk (05/24/2023)    Readmission Risk Interventions     No data to display

## 2023-05-26 NOTE — Plan of Care (Signed)

## 2023-05-26 NOTE — Progress Notes (Signed)
  Progress Note   Patient: Brent [name]. ZOX:[id number] DOB: 1946/08/26 DOA: 05/21/2023     5 DOS: the patient was seen and examined on 05/26/2023   Brief hospital course: 77 y.o. 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  G-tube was initially placed in 2021 when he got diagnosed with his neurological issues, was removed in May 2023 but the gastrocutaneous fistula has failed to heal since then, several attempts to close have been attempted in 2023 and 2024.  His fistula has grown larger and he decided to come to the hospital. General surgery consulted   Assessment and Plan: Principal problem Gastrocutaneous fistula-surgery consulted -CT scan on admission showed a fistula but no intraperitoneal leakage or associated inflammatory changes around it -Pt now s/p surgery 9/4, currently awaiting return of bowel function. Recs to con NPO with sips/chips   Active problems Insulin-dependent diabetes mellitus, with hyperglycemia -continue insulin as needed -glucose remains in the mid-200's -will increase semglee to 10 units BID   Essential hypertension -suboptimally controlled, home meds were held while NPO for surgery today -continue home medications   Hyperlipidemia-continue statin   Stage IV sacral ulcer, chronic, POA-wound care consult   Neurogenic bladder, with suprapubic catheter -monitor, currently afebrile, no leukocytosis, do not suspect UTI.  Patient usually gets diffuse bodyaches, malaise when he gets a UTI.  Currently he does not have any symptoms   Depression-continue home medications   Functional quadriplegia secondary to transverse myelitis - noted         Subjective: Drowsy this AM  Physical Exam: Vitals:   05/26/23 0447 05/26/23 0828 05/26/23 1243 05/26/23  1534  BP: (!) 150/52 (!) 126/56 110/65 (!) 153/61  Pulse: 80 76 76 73  Resp: 18 19 19 17   Temp: 98.9 F (37.2 C) 98.4 F (36.9 C) 98 F (36.7 C) 97.6 F (36.4 C)  TempSrc:  Oral Oral Oral  SpO2: 97% 97% 95% 97%  Weight:      Height:       General exam: Awake, laying in bed, in nad Respiratory system: Normal respiratory effort, no wheezing Cardiovascular system: regular rate, s1, s2 Gastrointestinal system: Soft, nondistended, positive BS Central nervous system: CN2-12 grossly intact, strength intact Extremities: Perfused, no clubbing Skin: Normal skin turgor, no notable skin lesions seen Psychiatry: difficult to assess given tiredness  Data Reviewed:  Labs reviewed: Na 136, K 4.5, Cr 0.71, WBC 10.7, Hgb 10.1, Plts 281   Family Communication: Pt in room, family not at bedside  Disposition: Status is: Inpatient Remains inpatient appropriate because: severity of illness  Planned Discharge Destination: Home    Author: Rickey Barbara, MD 05/26/2023 4:17 PM  For on call review [email or web address].

## 2023-05-26 NOTE — Progress Notes (Signed)
[facility] Progress Note  2 Days Post-Op  Subjective: CC-  No abdominal complaints this morning, but states that he just woke up so it's too early to tell. Doesn't think he's nauseated. No emesis. Only sweat from ostomy.  Objective: Vital signs in last 24 hours: Temp:  [98.3 F (36.8 C)-100.1 F (37.8 C)] 98.4 F (36.9 C) (09/06 0828) Pulse Rate:  [76-87] 76 (09/06 0828) Resp:  [18-19] 19 (09/06 0828) BP: (126-150)/(52-80) 126/56 (09/06 0828) SpO2:  [93 %-97 %] 97 % (09/06 0828) Last BM Date : 05/26/23 (Colostomy bag)  Intake/Output from previous day: No intake/output data recorded. Intake/Output this shift: Total I/O In: -  Out: 775 [Urine:775]  PE: Gen:  Alert, NAD, pleasant Abd: soft, distended but less than yesterday, few bowel sounds heard, appropriately tender over incisions, lap incisions with steri strips present/ some dried blood but no erythema, LUQ open wound at prior fistula site with stable surrounding erythema/ no purulent drainage. Ostomy with a little sweat in the bag  Lab Results:  Recent Labs    05/24/23 0329 05/26/23 0311  WBC 6.4 10.7*  HGB 10.3* 10.1*  HCT 33.1* 33.6*  PLT 265 281   BMET Recent Labs    05/25/23 0409 05/26/23 0311  NA 134* 136  K 4.8 4.5  CL 102 101  CO2 22 23  GLUCOSE 284* 269*  BUN 18 22  CREATININE 0.72 0.71  CALCIUM 8.4* 8.7*   PT/INR No results for input(s): "LABPROT", "INR" in the last 72 hours. CMP     Component Value Date/Time   NA 136 05/26/2023 0311   NA 140 04/11/2019 1028   K 4.5 05/26/2023 0311   CL 101 05/26/2023 0311   CO2 23 05/26/2023 0311   GLUCOSE 269 (H) 05/26/2023 0311   BUN 22 05/26/2023 0311   BUN 21 04/11/2019 1028   CREATININE 0.71 05/26/2023 0311   CREATININE 0.26 (L) 07/23/2020 1052   CALCIUM 8.7 (L) 05/26/2023 0311   PROT 5.8 (L) 05/25/2023 0409   PROT 6.6 01/20/2020 0754   ALBUMIN 2.9 (L) 05/25/2023 0409   ALBUMIN 4.2 03/10/2020 1757   AST 23 05/25/2023 0409    ALT 28 05/25/2023 0409   ALKPHOS 65 05/25/2023 0409   BILITOT 0.5 05/25/2023 0409   BILITOT 0.3 01/20/2020 0754   GFRNONAA >60 05/26/2023 0311   GFRNONAA 87 04/15/2016 1133   GFRAA >60 06/10/2020 0431   GFRAA >89 04/15/2016 1133   Lipase     Component Value Date/Time   LIPASE 20 05/21/2023 1347       Studies/Results: DG Abd Portable 1V  Result Date: 05/25/2023 CLINICAL DATA:  Ileus following gastrointestinal surgery. EXAM: PORTABLE ABDOMEN - 1 VIEW COMPARISON:  CT abdomen 12/08/2022 and CT abdomen 05/21/2023 FINDINGS: Patient has a left lower abdominal ostomy. Gas-filled loops small bowel in the mid and right abdomen. Small bowel measures close to 3.0 cm. Again noted is high density material / contrast involving the left kidney and this finding has been present on multiple examinations. Few densities at the left costophrenic angle could be associated with atelectasis or overlying structures. IMPRESSION: 1. Gas-filled loops of small bowel in the mid and right abdomen. Findings are nonspecific but could represent a postoperative ileus. Electronically Signed   By: Richarda Overlie M.D.   On: 05/25/2023 14:03   PERIPHERAL VASCULAR CATHETERIZATION  Result Date: 05/24/2023 See surgical note for result.   Anti-infectives: Anti-infectives (From admission, onward)    None  Assessment/Plan GC Fistula -POD#2 s/p Diagnostic laparoscopy with lysis of adhesions x 30 minutes, Gastric wedge resection of gastrocutaneous fistula 9/4 Dr. Dwain Sarna - Continue NPO excepts sips/chips and await return in bowel function. 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 Epimenio Foot of [facility] IDDM HTN HLD Neurogenic bladder with SP  tube    LOS: 5 days    Franne Forts, [facility] 05/26/2023, 8:45 AM Please see Amion for pager number during day hours 7:00am-4:30pm

## 2023-05-27 ENCOUNTER — Inpatient Hospital Stay (HOSPITAL_COMMUNITY): Payer: Medicare Other

## 2023-05-27 DIAGNOSIS — K316 Fistula of stomach and duodenum: Secondary | ICD-10-CM | POA: Diagnosis not present

## 2023-05-27 DIAGNOSIS — K9420 Gastrostomy complication, unspecified: Secondary | ICD-10-CM | POA: Diagnosis not present

## 2023-05-27 LAB — CBC
HCT: 32.1 % — ABNORMAL LOW (ref 39.0–52.0)
Hemoglobin: 9.8 g/dL — ABNORMAL LOW (ref 13.0–17.0)
MCH: 27.4 pg (ref 26.0–34.0)
MCHC: 30.5 g/dL (ref 30.0–36.0)
MCV: 89.7 fL (ref 80.0–100.0)
Platelets: 244 10*3/uL (ref 150–400)
RBC: 3.58 MIL/uL — ABNORMAL LOW (ref 4.22–5.81)
RDW: 16.2 % — ABNORMAL HIGH (ref 11.5–15.5)
WBC: 8.4 10*3/uL (ref 4.0–10.5)
nRBC: 0 % (ref 0.0–0.2)

## 2023-05-27 LAB — GLUCOSE, CAPILLARY
Glucose-Capillary: 251 mg/dL — ABNORMAL HIGH (ref 70–99)
Glucose-Capillary: 275 mg/dL — ABNORMAL HIGH (ref 70–99)
Glucose-Capillary: 245 mg/dL — ABNORMAL HIGH (ref 70–99)
Glucose-Capillary: 267 mg/dL — ABNORMAL HIGH (ref 70–99)
Glucose-Capillary: 311 mg/dL — ABNORMAL HIGH (ref 70–99)
Glucose-Capillary: 337 mg/dL — ABNORMAL HIGH (ref 70–99)

## 2023-05-27 LAB — URINALYSIS, W/ REFLEX TO CULTURE (INFECTION SUSPECTED)
Bilirubin Urine: NEGATIVE
Glucose, UA: 250 mg/dL — AB
Ketones, ur: NEGATIVE mg/dL
Nitrite: POSITIVE — AB
Protein, ur: 100 mg/dL — AB
Specific Gravity, Urine: 1.02 (ref 1.005–1.030)
WBC, UA: >50 WBC/hpf (ref 0–5)
pH: 6.5 (ref 5.0–8.0)

## 2023-05-27 LAB — PHOSPHORUS: Phosphorus: 3 mg/dL (ref 2.5–4.6)

## 2023-05-27 MED ORDER — INSULIN GLARGINE-YFGN 100 UNIT/ML ~~LOC~~ SOLN
25.0000 [IU] | Freq: Two times a day (BID) | SUBCUTANEOUS | Status: DC
  Administered 2023-05-27 – 2023-05-28 (×2): 25 [IU] via SUBCUTANEOUS
  Filled 2023-05-27 (×3): qty 0.25

## 2023-05-27 MED ORDER — TRAVASOL 10 % IV SOLN
INTRAVENOUS | Status: AC
  Filled 2023-05-27: qty 950.4

## 2023-05-27 NOTE — Progress Notes (Signed)
Pt has had several episodes of confusion throughout the night, usually is easily reoriented.  Pt has asked multiple times where he is.  Pt c/o being uncomfortable despite being turned q2h ( he refuses to ly on right side, and he did refuse mobility once tonight).  Pillows used at support, pulled up in bed 4 tx's.  Tylenol 1000mg  given po and Ativan 1mg  given IVP.  Will cont to observe.

## 2023-05-27 NOTE — Progress Notes (Signed)
3 Days Post-Op   Subjective/Chief Complaint: Confused by notes overnight, ativan probably doesn't help that. This morning he is awake, alert, wanting to know when he can go back to [facility].  Having gas out of stoma, no n/v   Objective: Vital signs in last 24 hours: Temp:  [97.6 F (36.4 C)-98.4 F (36.9 C)] 97.6 F (36.4 C) (09/07 0735) Pulse Rate:  [60-76] 60 (09/07 0735) Resp:  [17-19] 17 (09/06 1534) BP: (110-153)/(47-71) 113/71 (09/07 0735) SpO2:  [95 %-97 %] 97 % (09/07 0735) Last BM Date : 05/27/23 (Colostomy bag)  Intake/Output from previous day: 09/06 0701 - 09/07 0700 In: 2997.2 [P.O.:200; I.V.:2655.9; IV Piggyback:141.3] Out: 2325 [Urine:2325] Intake/Output this shift: No intake/output data recorded.  Ab soft wound dressed this am, a lot of air and some stool in bag  Lab Results:  Recent Labs    05/26/23 0311 05/27/23 0320  WBC 10.7* 8.4  HGB 10.1* 9.8*  HCT 33.6* 32.1*  PLT 281 244   BMET Recent Labs    05/25/23 0409 05/26/23 0311  NA 134* 136  K 4.8 4.5  CL 102 101  CO2 22 23  GLUCOSE 284* 269*  BUN 18 22  CREATININE 0.72 0.71  CALCIUM 8.4* 8.7*   PT/INR No results for input(s): "LABPROT", "INR" in the last 72 hours. ABG No results for input(s): "PHART", "HCO3" in the last 72 hours.  Invalid input(s): "PCO2", "PO2"  Studies/Results: DG Abd Portable 1V  Result Date: 05/25/2023 CLINICAL DATA:  Ileus following gastrointestinal surgery. EXAM: PORTABLE ABDOMEN - 1 VIEW COMPARISON:  CT abdomen 12/08/2022 and CT abdomen 05/21/2023 FINDINGS: Patient has a left lower abdominal ostomy. Gas-filled loops small bowel in the mid and right abdomen. Small bowel measures close to 3.0 cm. Again noted is high density material / contrast involving the left kidney and this finding has been present on multiple examinations. Few densities at the left costophrenic angle could be associated with atelectasis or overlying structures. IMPRESSION: 1. Gas-filled loops  of small bowel in the mid and right abdomen. Findings are nonspecific but could represent a postoperative ileus. Electronically Signed   By: Richarda Overlie M.D.   On: 05/25/2023 14:03    Anti-infectives: Anti-infectives (From admission, onward)    None       Assessment/Plan: GC Fistula -POD#3 s/p Diagnostic laparoscopy with lysis of adhesions, gastric wedge resection of gastrocutaneous fistula 9/4 Dr. 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 Epimenio Foot of [facility], he should not get rituxan until luq wound heals IDDM HTN HLD Neurogenic bladder with SP tube  Emelia Loron 05/27/2023

## 2023-05-27 NOTE — Progress Notes (Signed)
  Progress Note   Patient: Brent [name]. WUJ:[id number] DOB: 12/28/1945 DOA: 05/21/2023     6 DOS: the patient was seen and examined on 05/27/2023   Brief hospital course: 77 y.o. 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  G-tube was initially placed in 2021 when he got diagnosed with his neurological issues, was removed in May 2023 but the gastrocutaneous fistula has failed to heal since then, several attempts to close have been attempted in 2023 and 2024.  His fistula has grown larger and he decided to come to the hospital. General surgery consulted   Assessment and Plan: Principal problem Gastrocutaneous fistula-surgery consulted -CT scan on admission showed a fistula but no intraperitoneal leakage or associated inflammatory changes around it -Pt now s/p surgery 9/4, currently awaiting return of bowel function. Recs to con NPO with sips/chips   Active problems Insulin-dependent diabetes mellitus, with hyperglycemia -continue insulin as needed -glucose remains in the mid-200's -will on semglee to 20 units BID. Will increase to 25 units bid   Essential hypertension -suboptimally controlled, home meds were held while NPO for surgery today -continue home medications   Hyperlipidemia-continue statin   Stage IV sacral ulcer, chronic, POA-wound care consult   Neurogenic bladder, with suprapubic catheter - -was previously admitted for Morganella and pseudomonas UTI with sepsis in 6/24 -Recently noted by staff to be more confused. Remains afebrile and no Leukocytosis -UA does have >50k WBC with many bacteria, pos nitrites, mod leuks -Without abd discomfort. Staff reports mentation improved later in the day. Pt did receive ativan overnght, of note -will hold off on abx    Depression-continue home medications   Functional quadriplegia secondary to transverse myelitis - noted       Subjective: Drowsy this AM  Physical Exam: Vitals:   05/26/23 1534 05/26/23 2022 05/27/23 0403 05/27/23 0735  BP: (!) 153/61 (!) 151/58 (!) 133/47 113/71  Pulse: 73 70 68 60  Resp: 17     Temp: 97.6 F (36.4 C) 97.6 F (36.4 C) 97.8 F (36.6 C) 97.6 F (36.4 C)  TempSrc: Oral Oral Oral Oral  SpO2: 97% 97% 97% 97%  Weight:      Height:       General exam: Conversant, in no acute distress Respiratory system: normal chest rise, clear, no audible wheezing Cardiovascular system: regular rhythm, s1-s2 Gastrointestinal system: Nondistended, nontender, pos BS Central nervous system: No seizures, no tremors Extremities: No cyanosis, no joint deformities Skin: No rashes, no pallor Psychiatry: Affect normal // no auditory hallucinations   Data Reviewed:  Labs reviewed: Phos 3.0, WBC 8.4, Hgb 9.8, Plts 244   Family Communication: Pt in room, family not at bedside  Disposition: Status is: Inpatient Remains inpatient appropriate because: severity of illness  Planned Discharge Destination: Home    Author: Rickey Barbara, MD 05/27/2023 3:28 PM  For on call review [email or web address].

## 2023-05-27 NOTE — Progress Notes (Signed)
PHARMACY - TOTAL PARENTERAL NUTRITION CONSULT NOTE   Indication:  GC Fistula  Patient Measurements: Height: 6' (182.9 cm) Weight:  (unable to get weight on this bed) IBW/kg (Calculated) : 77.6 TPN AdjBW (KG): 96.6 Body mass index is 29.07 kg/m. Usual Weight: 97kg on admission   Assessment:  Patient admitted with worsening of GC fistula. 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 Patient with G-tube placed in 2021 which was then removed in 2023 due to issues with healing. There have been several attempts to close GC fistula in 2023 and 2024. General surgery consulted NPO for now to evaluate fistula output. Depending on clinical course potential for surgical intervention while inpatient in addition to post-pyloric feeds. However per general surgery, will start TPN 9/2.  Glucose / Insulin: A1C 8.0%, patient taking 42 units BID of insulin glargine and SSI PTA, CBGs >250 past 24 hours even with 94 units regular insulin in TPN + addition of semglee 20 units daily. Required 58 units SSI/24h.  Electrolytes: 9/6 labs: Na 136, K 4.5, Phos 2.4, Mag 2.0, CoCa 9.58   Repeat phos on 9/7 wnl at 3 Renal: Scr <1, BUN wnl Hepatic: LFTs WNL, albumin 2.9, pre-albumin 19 on 9/2, TG 287 (377 at baseline)  Intake / Output; MIVF: NS @ 22mL/hr, UOP 64mL/kg/hr no OP from colostomy reported, LBM 9/7 GI Imaging: 9/1 CT abdomen pelvis: GC fistula shown, trace pleural effusions 9/5 Abd portable gas-filled loops small bowel in mid and right abd. Nonspecific but could represent post-op ileus  GI Surgeries / Procedures:  9/4 laparoscopy with LOA, gastric wedge resection of GC fistula  Central access: PICC placed 9/2  TPN start date: 9/2   Nutritional Goals: Goal TPN rate is ~100 mL/hr (goal formula AA 54 g/L, dextrose 15%, lipids 27 g/L to provide 130 g of protein and 2390  kcal)  RD Assessment:  Estimated Needs Total Energy Estimated Needs: 2400-2700kcals Total Protein Estimated Needs: 120-130 gm Total Fluid Estimated Needs: 2.4-2.7 L  Current Nutrition:  TPN Starting clears 9/7 - if tolerates today plan to half TPN tomorrow  Plan:  Continue TPN at 60 mL/hr. TPN will provide 95g AA and 1207 kcal meeting approximately 75% of protein and 55% of calorie needs. Will advance to goal as tolerated - goal CBGs mostly <200 to advance rate further.  - Lipid content advanced on 9/6 due to improvement in TG's to help meet nutritional needs (increased by 3 grams/L). Continue decreased dextrose content with CBGs so elevated. Electrolytes in TPN: Na 150mEq/L, K 57mEq/L, Mg 9 mEq/L, Phos 25 mmol/L, Ca 29mEq/L. Cl:Ac 1:2 Add standard MVI and trace elements to TPN Continue Resistant q4h SSI at TPN. Semglee per MD inc to 20 units BID 9/5 Continue regular insulin in TPN to 94 units (this is max allowed in TPN of 65 units/L) Continue MIVF at 15 ml/hr. Monitor TPN labs on Mon/Thurs once stable F/u clear liquid tolerability and ability to half TPN on 9/8  Rexford Maus, PharmD, BCPS 05/27/2023 7:34 AM

## 2023-05-28 DIAGNOSIS — K9420 Gastrostomy complication, unspecified: Secondary | ICD-10-CM | POA: Diagnosis not present

## 2023-05-28 DIAGNOSIS — K316 Fistula of stomach and duodenum: Secondary | ICD-10-CM | POA: Diagnosis not present

## 2023-05-28 LAB — CBC
HCT: 31 % — ABNORMAL LOW (ref 39.0–52.0)
Hemoglobin: 9.7 g/dL — ABNORMAL LOW (ref 13.0–17.0)
MCH: 27.6 pg (ref 26.0–34.0)
MCHC: 31.3 g/dL (ref 30.0–36.0)
MCV: 88.3 fL (ref 80.0–100.0)
Platelets: 257 10*3/uL (ref 150–400)
RBC: 3.51 MIL/uL — ABNORMAL LOW (ref 4.22–5.81)
RDW: 15.9 % — ABNORMAL HIGH (ref 11.5–15.5)
WBC: 7.7 10*3/uL (ref 4.0–10.5)
nRBC: 0 % (ref 0.0–0.2)

## 2023-05-28 LAB — COMPREHENSIVE METABOLIC PANEL
ALT: 21 U/L (ref 0–44)
AST: 16 U/L (ref 15–41)
Albumin: 2.4 g/dL — ABNORMAL LOW (ref 3.5–5.0)
Alkaline Phosphatase: 76 U/L (ref 38–126)
Anion gap: 9 (ref 5–15)
BUN: 13 mg/dL (ref 8–23)
CO2: 25 mmol/L (ref 22–32)
Calcium: 8.5 mg/dL — ABNORMAL LOW (ref 8.9–10.3)
Chloride: 99 mmol/L (ref 98–111)
Creatinine, Ser: 0.53 mg/dL — ABNORMAL LOW (ref 0.61–1.24)
GFR, Estimated: >60 mL/min (ref >60)
Glucose, Bld: 267 mg/dL — ABNORMAL HIGH (ref 70–99)
Potassium: 3.7 mmol/L (ref 3.5–5.1)
Sodium: 133 mmol/L — ABNORMAL LOW (ref 135–145)
Total Bilirubin: 0.4 mg/dL (ref 0.3–1.2)
Total Protein: 5.5 g/dL — ABNORMAL LOW (ref 6.5–8.1)

## 2023-05-28 LAB — GLUCOSE, CAPILLARY
Glucose-Capillary: 268 mg/dL — ABNORMAL HIGH (ref 70–99)
Glucose-Capillary: 224 mg/dL — ABNORMAL HIGH (ref 70–99)
Glucose-Capillary: 224 mg/dL — ABNORMAL HIGH (ref 70–99)
Glucose-Capillary: 266 mg/dL — ABNORMAL HIGH (ref 70–99)
Glucose-Capillary: 266 mg/dL — ABNORMAL HIGH (ref 70–99)
Glucose-Capillary: 295 mg/dL — ABNORMAL HIGH (ref 70–99)
Glucose-Capillary: 326 mg/dL — ABNORMAL HIGH (ref 70–99)

## 2023-05-28 MED ORDER — TRAVASOL 10 % IV SOLN
INTRAVENOUS | Status: AC
  Filled 2023-05-28: qty 950.4

## 2023-05-28 MED ORDER — INSULIN GLARGINE-YFGN 100 UNIT/ML ~~LOC~~ SOLN
30.0000 [IU] | Freq: Two times a day (BID) | SUBCUTANEOUS | Status: DC
  Administered 2023-05-28 – 2023-05-30 (×4): 30 [IU] via SUBCUTANEOUS
  Filled 2023-05-28 (×5): qty 0.3

## 2023-05-28 NOTE — Progress Notes (Addendum)
PHARMACY - TOTAL PARENTERAL NUTRITION CONSULT NOTE   Indication:  GC Fistula  Patient Measurements: Height: 6' (182.9 cm) Weight:  (unable to get weight on this bed) IBW/kg (Calculated) : 77.6 TPN AdjBW (KG): 96.6 Body mass index is 29.07 kg/m. Usual Weight: 97kg on admission   Assessment:  Patient admitted with worsening of GC fistula. 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 Patient with G-tube placed in 2021 which was then removed in 2023 due to issues with healing. There have been several attempts to close GC fistula in 2023 and 2024. General surgery consulted NPO for now to evaluate fistula output. Depending on clinical course potential for surgical intervention while inpatient in addition to post-pyloric feeds. However per general surgery, will start TPN 9/2.  Glucose / Insulin: A1C 8.0%, patient taking 42 units BID of insulin glargine and SSI PTA, CBGs >250 past 24 hours even with 94 units regular insulin in TPN + addition of semglee 25 units BID. Required 59 units SSI/24h.  Electrolytes: Na 133, K 3.7, last mag Phos 3 and Mag 2.0 Renal: Scr <1, BUN wnl Hepatic: LFTs WNL, albumin 2.9, pre-albumin 19 on 9/2, TG 287 (377 at baseline)  Intake / Output; MIVF: NS @ 34mL/hr, no UOP charted, ostomy, 1480 mL PO intake GI Imaging: 9/1 CT abdomen pelvis: GC fistula shown, trace pleural effusions 9/5 Abd portable gas-filled loops small bowel in mid and right abd. Nonspecific but could represent post-op ileus  GI Surgeries / Procedures:  9/4 laparoscopy with LOA, gastric wedge resection of GC fistula  Central access: PICC placed 9/2  TPN start date: 9/2   Nutritional Goals: Goal TPN rate is ~100 mL/hr (goal formula AA 54 g/L, dextrose 15%, lipids 27 g/L to provide 130 g of protein and 2390 kcal)  RD Assessment:  Estimated Needs Total  Energy Estimated Needs: 2400-2700kcals Total Protein Estimated Needs: 120-130 gm Total Fluid Estimated Needs: 2.4-2.7 L  Current Nutrition:  TPN Full liquid 9/8  Plan:  Continue TPN at 60 mL/hr to provide 63g AA and 804 kcal meeting approximately 75% of protein and 55% of caloric needs. Discussed w/ CCS - consulted to reduce TPN to half today but since currently at half caloric needs will keep the same today.  Electrolytes in TPN: Na 11mEq/L, K 28mEq/L, Mg 9 mEq/L, Phos 25 mmol/L, Ca 15mEq/L. Cl:Ac 1:2 Add standard MVI and trace elements to TPN Continue Resistant q4h SSI. Semglee per MD inc to 25 units BID 9/8 Continue regular insulin at 94 units in TPN bag. Continue MIVF at 15 ml/hr. Monitor TPN labs on Mon/Thurs once stable   Rexford Maus, PharmD, BCPS 05/28/2023 7:31 AM

## 2023-05-28 NOTE — Progress Notes (Signed)
4 Days Post-Op   Subjective/Chief Complaint: Thinks he is at church this am, complains of rattling in chest, tol clears fine   Objective: Vital signs in last 24 hours: Temp:  [97.6 F (36.4 C)-98 F (36.7 C)] 98 F (36.7 C) (09/08 0801) Pulse Rate:  [62-78] 62 (09/08 0801) Resp:  [18] 18 (09/08 0801) BP: (128-163)/(44-61) 139/44 (09/08 0801) SpO2:  [96 %-100 %] 99 % (09/08 0801) Last BM Date : 05/28/23 (colostomy)  Intake/Output from previous day: 09/07 0701 - 09/08 0700 In: 2943.8 [P.O.:1480; I.V.:1463.8] Out: 2290 [Stool:2290] Intake/Output this shift: No intake/output data recorded.  Ab soft gc fistula site without infection, ostomy with stool and a lot of air  Lab Results:  Recent Labs    05/27/23 0320 05/28/23 0408  WBC 8.4 7.7  HGB 9.8* 9.7*  HCT 32.1* 31.0*  PLT 244 257   BMET Recent Labs    05/26/23 0311 05/28/23 0408  NA 136 133*  K 4.5 3.7  CL 101 99  CO2 23 25  GLUCOSE 269* 267*  BUN 22 13  CREATININE 0.71 0.53*  CALCIUM 8.7* 8.5*   PT/INR No results for input(s): "LABPROT", "INR" in the last 72 hours. ABG No results for input(s): "PHART", "HCO3" in the last 72 hours.  Invalid input(s): "PCO2", "PO2"  Studies/Results: DG CHEST PORT 1 VIEW  Result Date: 05/27/2023 CLINICAL DATA:  Pneumonia. EXAM: PORTABLE CHEST 1 VIEW COMPARISON:  March 07, 2023. FINDINGS: Stable cardiomegaly. Minimal bibasilar subsegmental atelectasis or scarring is noted. Right-sided PICC line is noted with distal tip in expected position of the SVC. Bony thorax is unremarkable. IMPRESSION: Minimal bibasilar subsegmental atelectasis or scarring. Electronically Signed   By: Lupita Raider M.D.   On: 05/27/2023 14:50    Anti-infectives: Anti-infectives (From admission, onward)    None       Assessment/Plan: -POD#4 s/p Diagnostic laparoscopy with lysis of adhesions, gastric wedge resection of gastrocutaneous fistula 9/4 Dr. 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 Epimenio Foot of [facility], he should not get rituxan until luq wound heals IDDM HTN HLD Neurogenic bladder with SP tube   Brent [name] 05/28/2023

## 2023-05-28 NOTE — Progress Notes (Signed)
  Progress Note   Patient: Brent [name]. ZOX:[id number] DOB: 1945-12-22 DOA: 05/21/2023     7 DOS: the patient was seen and examined on 05/28/2023   Brief hospital course: 77 y.o. 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  G-tube was initially placed in 2021 when he got diagnosed with his neurological issues, was removed in May 2023 but the gastrocutaneous fistula has failed to heal since then, several attempts to close have been attempted in 2023 and 2024.  His fistula has grown larger and he decided to come to the hospital. General surgery consulted   Assessment and Plan: Principal problem Gastrocutaneous fistula-surgery consulted -CT scan on admission showed a fistula but no intraperitoneal leakage or associated inflammatory changes around it -Pt now s/p surgery 9/4, Diet advanced to fulls with TPN    Active problems Insulin-dependent diabetes mellitus, with hyperglycemia -continue insulin as needed -glucose remains in the mid-200's -will on semglee to 25 units BID   Essential hypertension -suboptimally controlled, home meds were held while NPO for surgery today -continue home medications   Hyperlipidemia-continue statin   Stage IV sacral ulcer, chronic, POA-wound care consult   Neurogenic bladder, with suprapubic catheter - -was previously admitted for Morganella and pseudomonas UTI with sepsis in 6/24 -Recently noted by staff to be more confused. Remains afebrile and no Leukocytosis -UA does have >50k WBC with many bacteria, pos nitrites, mod leuks, albeit pt does have chronic suprapubic cath -Without abd discomfort. Mentation seems much improved today. More alert -cont to hold off on abx   Depression-continue home medications   Functional quadriplegia secondary to  transverse myelitis - noted       Subjective: Feels better this AM. Tolerating liquid diet this AM  Physical Exam: Vitals:   05/27/23 1955 05/28/23 0331 05/28/23 0801 05/28/23 1458  BP: (!) 163/61 (!) 128/52 (!) 139/44 (!) 169/53  Pulse: 78 65 62 70  Resp:   18 18  Temp:  97.6 F (36.4 C) 98 F (36.7 C) 97.6 F (36.4 C)  TempSrc:  Oral  Oral  SpO2: 99% 96% 99% 99%  Weight:      Height:       General exam: Awake, laying in bed, in nad Respiratory system: Normal respiratory effort, no wheezing Cardiovascular system: regular rate, s1, s2 Gastrointestinal system: Soft, nondistended, positive BS Central nervous system: CN2-12 grossly intact, strength intact Extremities: Perfused, no clubbing Skin: Normal skin turgor, no notable skin lesions seen Psychiatry: Mood normal // no visual hallucinations   Data Reviewed:  Labs reviewed: Na 133, K 3.7, Cr 0.53, WBC 7.7, Hgb 9.7, Plts 257   Family Communication: Pt in room, family not at bedside  Disposition: Status is: Inpatient Remains inpatient appropriate because: severity of illness  Planned Discharge Destination: Home    Author: Rickey Barbara, MD 05/28/2023 4:19 PM  For on call review [email or web address].

## 2023-05-28 NOTE — Plan of Care (Signed)
  Problem: Education: Goal: Knowledge of General Education information will improve Description Including pain rating scale, medication(s)/side effects and non-pharmacologic comfort measures Outcome: Progressing   Problem: Health Behavior/Discharge Planning: Goal: Ability to manage health-related needs will improve Outcome: Progressing   

## 2023-05-29 DIAGNOSIS — K9420 Gastrostomy complication, unspecified: Secondary | ICD-10-CM | POA: Diagnosis not present

## 2023-05-29 DIAGNOSIS — K316 Fistula of stomach and duodenum: Secondary | ICD-10-CM | POA: Diagnosis not present

## 2023-05-29 LAB — PHOSPHORUS: Phosphorus: 4.1 mg/dL (ref 2.5–4.6)

## 2023-05-29 LAB — COMPREHENSIVE METABOLIC PANEL WITH GFR
ALT: 19 U/L (ref 0–44)
AST: 15 U/L (ref 15–41)
Albumin: 2.3 g/dL — ABNORMAL LOW (ref 3.5–5.0)
Alkaline Phosphatase: 80 U/L (ref 38–126)
Anion gap: 8 (ref 5–15)
BUN: 14 mg/dL (ref 8–23)
CO2: 25 mmol/L (ref 22–32)
Calcium: 8.7 mg/dL — ABNORMAL LOW (ref 8.9–10.3)
Chloride: 99 mmol/L (ref 98–111)
Creatinine, Ser: 0.62 mg/dL (ref 0.61–1.24)
GFR, Estimated: >60 mL/min (ref >60)
Glucose, Bld: 289 mg/dL — ABNORMAL HIGH (ref 70–99)
Potassium: 4.1 mmol/L (ref 3.5–5.1)
Sodium: 132 mmol/L — ABNORMAL LOW (ref 135–145)
Total Bilirubin: 0.3 mg/dL (ref 0.3–1.2)
Total Protein: 5.5 g/dL — ABNORMAL LOW (ref 6.5–8.1)

## 2023-05-29 LAB — MAGNESIUM: Magnesium: 1.7 mg/dL (ref 1.7–2.4)

## 2023-05-29 LAB — TRIGLYCERIDES: Triglycerides: 350 mg/dL — ABNORMAL HIGH (ref <150)

## 2023-05-29 LAB — GLUCOSE, CAPILLARY
Glucose-Capillary: 277 mg/dL — ABNORMAL HIGH (ref 70–99)
Glucose-Capillary: 247 mg/dL — ABNORMAL HIGH (ref 70–99)
Glucose-Capillary: 218 mg/dL — ABNORMAL HIGH (ref 70–99)
Glucose-Capillary: 338 mg/dL — ABNORMAL HIGH (ref 70–99)
Glucose-Capillary: 349 mg/dL — ABNORMAL HIGH (ref 70–99)

## 2023-05-29 MED ORDER — ATENOLOL 25 MG PO TABS: 12.5000 mg | ORAL_TABLET | ORAL | Status: DC

## 2023-05-29 MED ORDER — AMOXICILLIN-POT CLAVULANATE 875-125 MG PO TABS
1.0000 | ORAL_TABLET | Freq: Two times a day (BID) | ORAL | Status: DC
  Administered 2023-05-29 – 2023-05-30 (×3): 1 via ORAL
  Filled 2023-05-29 (×3): qty 1

## 2023-05-29 MED ORDER — ATENOLOL 50 MG PO TABS
25.0000 mg | ORAL_TABLET | Freq: Every day | ORAL | Status: DC
  Administered 2023-05-29 – 2023-05-31 (×3): 25 mg via ORAL
  Filled 2023-05-29 (×3): qty 1

## 2023-05-29 MED ORDER — ATENOLOL 25 MG PO TABS
12.5000 mg | ORAL_TABLET | Freq: Every day | ORAL | Status: DC
  Administered 2023-05-30 – 2023-05-31 (×2): 12.5 mg via ORAL
  Filled 2023-05-29 (×2): qty 0.5

## 2023-05-29 MED ORDER — SPIRONOLACTONE 25 MG PO TABS
25.0000 mg | ORAL_TABLET | Freq: Every day | ORAL | Status: DC
  Administered 2023-05-29 – 2023-05-31 (×3): 25 mg via ORAL
  Filled 2023-05-29 (×3): qty 1

## 2023-05-29 NOTE — Progress Notes (Signed)
  Progress Note   Patient: Brent [name]. ZOX:[id number] DOB: 30-Aug-1946 DOA: 05/21/2023     8 DOS: the patient was seen and examined on 05/29/2023   Brief hospital course: 77 y.o. 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  G-tube was initially placed in 2021 when he got diagnosed with his neurological issues, was removed in May 2023 but the gastrocutaneous fistula has failed to heal since then, several attempts to close have been attempted in 2023 and 2024.  His fistula has grown larger and he decided to come to the hospital. General surgery consulted   Assessment and Plan: Principal problem Gastrocutaneous fistula-surgery consulted -CT scan on admission showed a fistula but no intraperitoneal leakage or associated inflammatory changes around it -Pt now s/p surgery 9/4, Diet advanced per surgery with TPN to be stopped  -Purulence noted today, PO augmentin started per Gen Surg   Active problems Insulin-dependent diabetes mellitus, with hyperglycemia -continue insulin as needed -glucose remains in the mid-200's -continue on semglee to 30 units BID   Essential hypertension -suboptimally controlled, home meds were held while NPO  -continue home medications   Hyperlipidemia-continue statin   Stage IV sacral ulcer, chronic, POA-wound care consult   Neurogenic bladder, with suprapubic catheter - -was previously admitted for Morganella and pseudomonas UTI with sepsis in 6/24 -Recently noted by staff to be more confused. Remains afebrile and no Leukocytosis -UA does have >50k WBC with many bacteria, pos nitrites, mod leuks, albeit pt does have chronic suprapubic cath -would not treat as UTI for now   Depression-continue home medications   Functional quadriplegia secondary to  transverse myelitis - noted       Subjective: Reports feeling better today  Physical Exam: Vitals:   05/28/23 1458 05/28/23 1955 05/29/23 0216 05/29/23 0840  BP: (!) 169/53 (!) 165/57 (!) 162/47 (!) 169/43  Pulse: 70 74 63 64  Resp: 18   18  Temp: 97.6 F (36.4 C) 98 F (36.7 C) 97.8 F (36.6 C) 98 F (36.7 C)  TempSrc: Oral Oral Oral Oral  SpO2: 99% 99% 98% 100%  Weight:      Height:       General exam: Conversant, in no acute distress Respiratory system: normal chest rise, clear, no audible wheezing Cardiovascular system: regular rhythm, s1-s2 Gastrointestinal system: Nondistended, nontender, pos BS Central nervous system: No seizures, no tremors Extremities: No cyanosis, no joint deformities Skin: No rashes, no pallor Psychiatry: Affect normal // no auditory hallucinations   Data Reviewed:  Labs reviewed: Na 132, K 4.1, C 0.62   Family Communication: Pt in room, family not at bedside  Disposition: Status is: Inpatient Remains inpatient appropriate because: severity of illness  Planned Discharge Destination: Home    Author: Rickey Barbara, MD 05/29/2023 2:58 PM  For on call review [email or web address].

## 2023-05-29 NOTE — Progress Notes (Signed)
Wound care provided as per order.

## 2023-05-29 NOTE — Progress Notes (Addendum)
5 Days Post-Op   Subjective/Chief Complaint: A&Ox3  States he had 3 meals yesterday, denies vomiting or nausea.    Objective: Vital signs in last 24 hours: Temp:  [97.6 F (36.4 C)-98 F (36.7 C)] 98 F (36.7 C) (09/09 0840) Pulse Rate:  [63-74] 64 (09/09 0840) Resp:  [18] 18 (09/09 0840) BP: (162-169)/(43-57) 169/43 (09/09 0840) SpO2:  [98 %-100 %] 100 % (09/09 0840) Last BM Date : 05/28/23  Intake/Output from previous day: 09/08 0701 - 09/09 0700 In: 995 [P.O.:200; I.V.:795] Out: 1525 [Urine:1300; Stool:225] Intake/Output this shift: No intake/output data recorded.  Ab soft gc fistula site without infection, ostomy with stool and a lot of air RUQ port site with blanching erythema that tracks lateral to incision - purulence expresse from wound using a Q-tip.   Lab Results:  Recent Labs    05/27/23 0320 05/28/23 0408  WBC 8.4 7.7  HGB 9.8* 9.7*  HCT 32.1* 31.0*  PLT 244 257   BMET Recent Labs    05/28/23 0408 05/29/23 0340  NA 133* 132*  K 3.7 4.1  CL 99 99  CO2 25 25  GLUCOSE 267* 289*  BUN 13 14  CREATININE 0.53* 0.62  CALCIUM 8.5* 8.7*   PT/INR No results for input(s): "LABPROT", "INR" in the last 72 hours. ABG No results for input(s): "PHART", "HCO3" in the last 72 hours.  Invalid input(s): "PCO2", "PO2"  Studies/Results: DG CHEST PORT 1 VIEW  Result Date: 05/27/2023 CLINICAL DATA:  Pneumonia. EXAM: PORTABLE CHEST 1 VIEW COMPARISON:  March 07, 2023. FINDINGS: Stable cardiomegaly. Minimal bibasilar subsegmental atelectasis or scarring is noted. Right-sided PICC line is noted with distal tip in expected position of the SVC. Bony thorax is unremarkable. IMPRESSION: Minimal bibasilar subsegmental atelectasis or scarring. Electronically Signed   By: Lupita Raider M.D.   On: 05/27/2023 14:50    Anti-infectives: Anti-infectives (From admission, onward)    None       Assessment/Plan: -POD#5 s/p Diagnostic laparoscopy with lysis of adhesions,  gastric wedge resection of gastrocutaneous fistula 9/4 Dr. Dwain Sarna - fulls, ok to stop TPN. Likely advance to soft diet today - BID wet to dry dressing changes to LUQ abdominal wound - purulence drained from RUQ, I  removed the stitch to facilitate better drainage of purulent fluid. Start augmentin for 5-7 days.    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 Epimenio Foot of [facility], he should not get rituxan until luq wound heals IDDM HTN HLD Neurogenic bladder with SP tube   Adam Phenix 05/29/2023

## 2023-05-30 DIAGNOSIS — K316 Fistula of stomach and duodenum: Secondary | ICD-10-CM | POA: Diagnosis not present

## 2023-05-30 DIAGNOSIS — K9420 Gastrostomy complication, unspecified: Secondary | ICD-10-CM | POA: Diagnosis not present

## 2023-05-30 LAB — URINE CULTURE: Culture: >=100000 — AB

## 2023-05-30 LAB — CBC
HCT: 30.9 % — ABNORMAL LOW (ref 39.0–52.0)
Hemoglobin: 9.4 g/dL — ABNORMAL LOW (ref 13.0–17.0)
MCH: 26.3 pg (ref 26.0–34.0)
MCHC: 30.4 g/dL (ref 30.0–36.0)
MCV: 86.3 fL (ref 80.0–100.0)
Platelets: 343 10*3/uL (ref 150–400)
RBC: 3.58 MIL/uL — ABNORMAL LOW (ref 4.22–5.81)
RDW: 15.9 % — ABNORMAL HIGH (ref 11.5–15.5)
WBC: 9.6 10*3/uL (ref 4.0–10.5)
nRBC: 0 % (ref 0.0–0.2)

## 2023-05-30 LAB — GLUCOSE, CAPILLARY
Glucose-Capillary: 303 mg/dL — ABNORMAL HIGH (ref 70–99)
Glucose-Capillary: 199 mg/dL — ABNORMAL HIGH (ref 70–99)
Glucose-Capillary: 159 mg/dL — ABNORMAL HIGH (ref 70–99)
Glucose-Capillary: 258 mg/dL — ABNORMAL HIGH (ref 70–99)
Glucose-Capillary: 226 mg/dL — ABNORMAL HIGH (ref 70–99)
Glucose-Capillary: 245 mg/dL — ABNORMAL HIGH (ref 70–99)

## 2023-05-30 MED ORDER — INSULIN ASPART 100 UNIT/ML IJ SOLN
0.0000 [IU] | Freq: Every day | INTRAMUSCULAR | Status: DC
  Administered 2023-05-30: 2 [IU] via SUBCUTANEOUS
  Administered 2023-05-31: 3 [IU] via SUBCUTANEOUS

## 2023-05-30 MED ORDER — INSULIN GLARGINE-YFGN 100 UNIT/ML ~~LOC~~ SOLN
35.0000 [IU] | Freq: Two times a day (BID) | SUBCUTANEOUS | Status: DC
  Administered 2023-05-30 – 2023-05-31 (×3): 35 [IU] via SUBCUTANEOUS
  Filled 2023-05-30 (×4): qty 0.35

## 2023-05-30 MED ORDER — INSULIN ASPART 100 UNIT/ML IJ SOLN
0.0000 [IU] | Freq: Three times a day (TID) | INTRAMUSCULAR | Status: DC
  Administered 2023-05-30: 5 [IU] via SUBCUTANEOUS
  Administered 2023-05-30: 8 [IU] via SUBCUTANEOUS
  Administered 2023-05-30 – 2023-05-31 (×2): 3 [IU] via SUBCUTANEOUS
  Administered 2023-05-31 (×2): 5 [IU] via SUBCUTANEOUS

## 2023-05-30 MED ORDER — INSULIN ASPART 100 UNIT/ML IJ SOLN
5.0000 [IU] | Freq: Three times a day (TID) | INTRAMUSCULAR | Status: DC
  Administered 2023-05-30 – 2023-05-31 (×5): 5 [IU] via SUBCUTANEOUS

## 2023-05-30 MED ORDER — AMOXICILLIN-POT CLAVULANATE 875-125 MG PO TABS
1.0000 | ORAL_TABLET | Freq: Two times a day (BID) | ORAL | Status: DC
  Administered 2023-05-30 – 2023-05-31 (×3): 1 via ORAL
  Filled 2023-05-30 (×3): qty 1

## 2023-05-30 NOTE — Progress Notes (Signed)
6 Days Post-Op   Subjective/Chief Complaint: A&Ox3  Reports eating solid food without nausea or increased pain, said he had a bagel, egg, and bacon today. Pain controlled.   Objective: Vital signs in last 24 hours: Temp:  [97.8 F (36.6 C)-98.4 F (36.9 C)] 98.2 F (36.8 C) (09/10 0526) Pulse Rate:  [55-78] 55 (09/10 0750) Resp:  [16-18] 18 (09/10 0526) BP: (108-173)/(44-74) 137/60 (09/10 0750) SpO2:  [97 %-99 %] 99 % (09/10 0750) Weight:  [97.2 kg] 97.2 kg (09/10 0606) Last BM Date : 05/29/23  Intake/Output from previous day: 09/09 0701 - 09/10 0700 In: 600 [P.O.:600] Out: 2750 [Urine:2350; Stool:400] Intake/Output this shift: Total I/O In: 125 [P.O.:125] Out: -   Ab soft gc fistula site without infection, ostomy with stool and a lot of air RUQ port site with interval decrease in cellulitis, re-packed loosely with 2/2 gauze that was unfolded.   Lab Results:  Recent Labs    05/28/23 0408 05/30/23 0311  WBC 7.7 9.6  HGB 9.7* 9.4*  HCT 31.0* 30.9*  PLT 257 343   BMET Recent Labs    05/28/23 0408 05/29/23 0340  NA 133* 132*  K 3.7 4.1  CL 99 99  CO2 25 25  GLUCOSE 267* 289*  BUN 13 14  CREATININE 0.53* 0.62  CALCIUM 8.5* 8.7*   PT/INR No results for input(s): "LABPROT", "INR" in the last 72 hours. ABG No results for input(s): "PHART", "HCO3" in the last 72 hours.  Invalid input(s): "PCO2", "PO2"  Studies/Results: No results found.  Anti-infectives: Anti-infectives (From admission, onward)    Start     Dose/Rate Route Frequency Ordered Stop   05/29/23 1045  amoxicillin-clavulanate (AUGMENTIN) 875-125 MG per tablet 1 tablet        1 tablet Oral Every 12 hours 05/29/23 0957         Assessment/Plan: -POD#6 s/p Diagnostic laparoscopy with lysis of adhesions, gastric wedge resection of gastrocutaneous fistula 9/4 Dr. Dwain Sarna - soft diet  - BID wet to dry dressing changes to LUQ abdominal wound, once daily packing change to RUQ wound -- I have  asked the RN To obtain 1/4 " iodoform gauze so we can start wicking the wound with this. She should only need this for 5-7 days - stable for discharge from a surgical perspective. Would complete a total of 1 week Augmentin for RUQ cellulitis, wound care as above. I will arrange follow up with Dr. Dwain Sarna.   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 Epimenio Foot of [facility], he should not get rituxan until luq wound heals IDDM HTN HLD Neurogenic bladder with SP tube   Brent [name] 05/30/2023

## 2023-05-30 NOTE — Plan of Care (Signed)
  Problem: Coping: Goal: Ability to adjust to condition or change in health will improve Outcome: Progressing   Problem: Fluid Volume: Goal: Ability to maintain a balanced intake and output will improve Outcome: Progressing   Problem: Health Behavior/Discharge Planning: Goal: Ability to identify and utilize available resources and services will improve Outcome: Progressing   Problem: Nutritional: Goal: Maintenance of adequate nutrition will improve Outcome: Progressing   Problem: Education: Goal: Knowledge of General Education information will improve Description: Including pain rating scale, medication(s)/side effects and non-pharmacologic comfort measures Outcome: Progressing   Problem: Health Behavior/Discharge Planning: Goal: Ability to manage health-related needs will improve Outcome: Progressing   Problem: Clinical Measurements: Goal: Respiratory complications will improve Outcome: Progressing

## 2023-05-30 NOTE — Progress Notes (Signed)
  Progress Note   Patient: Brent [name]. ZOX:[id number] DOB: 02-21-1946 DOA: 05/21/2023     9 DOS: the patient was seen and examined on 05/30/2023   Brief hospital course: 77 y.o. 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  G-tube was initially placed in 2021 when he got diagnosed with his neurological issues, was removed in May 2023 but the gastrocutaneous fistula has failed to heal since then, several attempts to close have been attempted in 2023 and 2024.  His fistula has grown larger and he decided to come to the hospital. General surgery consulted   Assessment and Plan: Principal problem Gastrocutaneous fistula-surgery consulted -CT scan on admission showed a fistula but no intraperitoneal leakage or associated inflammatory changes around it -Pt now s/p surgery 9/4, Diet advanced per surgery with TPN to be stopped  -Purulence noted 9/9 with  PO augmentin started per Gen Surg. Pt tolerating without issues thus far   Active problems Insulin-dependent diabetes mellitus, with hyperglycemia -continue SSI as needed -glucose now in the 190-250's -Increase semglee to 35 units BID. Continue 5 units meal coverage   Essential hypertension -suboptimally controlled, home meds were held while NPO  -continue home medications   Hyperlipidemia-continue statin   Stage IV sacral ulcer, chronic, POA-wound care consult   Neurogenic bladder, with suprapubic catheter - -was previously admitted for Morganella and pseudomonas UTI with sepsis in 6/24 -Recently noted by staff to be more confused. Remains afebrile and no Leukocytosis -UA does have >50k WBC with many bacteria, pos nitrites, mod leuks, albeit pt does have chronic suprapubic cath -would not treat as UTI at this time    Depression-continue home medications   Functional quadriplegia secondary to transverse myelitis - noted       Subjective: States feeling better. Eager to be discharged soon  Physical Exam: Vitals:   05/30/23 0641 05/30/23 0750 05/30/23 1000 05/30/23 1537  BP: (!) 110/44 137/60  (!) 155/50  Pulse:  (!) 55 60 62  Resp:      Temp:      TempSrc:      SpO2:  99%  100%  Weight:      Height:       General exam: Awake, laying in bed, in nad Respiratory system: Normal respiratory effort, no wheezing Cardiovascular system: regular rate, s1, s2 Gastrointestinal system: Soft, nondistended, positive BS Central nervous system: CN2-12 grossly intact, strength intact Extremities: Perfused, no clubbing Skin: Normal skin turgor, no notable skin lesions seen Psychiatry: Mood normal // no visual hallucinations   Data Reviewed:  Labs reviewed: WBC 9.6, Hgb 9.4, Plts 343   Family Communication: Pt in room, family not at bedside  Disposition: Status is: Inpatient Remains inpatient appropriate because: severity of illness  Planned Discharge Destination: Skilled nursing facility    Author: Rickey Barbara, MD 05/30/2023 3:46 PM  For on call review [email or web address].

## 2023-05-30 NOTE — Progress Notes (Addendum)
Nutrition Follow-up  DOCUMENTATION CODES:   Not applicable  INTERVENTION:  - Continue soft diet.   NUTRITION DIAGNOSIS:   Inadequate oral intake related to inability to eat as evidenced by NPO status.  GOAL:   Provide needs based on ASPEN/SCCM guidelines  MONITOR:   I & O's, Labs  REASON FOR ASSESSMENT:   Consult New TPN/TNA  ASSESSMENT:   77 y.o. male admits related to gastrocutaneous fistula. 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  Meds reviewed:  lipitor, pepcid, sliding scale insulin, semglee, cozaar, senokot, aldactone. Labs reviewed: Na low. FS BG 159-349 mg/dL.   TPN has been stopped. Pt's diet was advanced to a soft diet yesterday. The pt states that he has a great appetite and has been tolerating soft diet well. Per record, pt has been eating 100% of his meals. Pt is likely meeting his needs at this time. RD will continue to monitor PO intakes.   Diet Order:   Diet Order             DIET SOFT Room service appropriate? Yes; Fluid consistency: Thin  Diet effective now                   EDUCATION NEEDS:   Not appropriate for education at this time  Skin:  Skin Assessment: Skin Integrity Issues: Skin Integrity Issues:: Stage III Stage III: bilateral buttocks  Last BM:  400 mL- in past 24 hr via colostomy  Height:   Ht Readings from Last 1 Encounters:  05/24/23 6' (1.829 m)    Weight:   Wt Readings from Last 1 Encounters:  05/30/23 97.2 kg    Ideal Body Weight:     BMI:  Body mass index is 29.06 kg/m.  Estimated Nutritional Needs:   Kcal:  2400-2700kcals  Protein:  120-130 gm  Fluid:  2.4-2.7 L  Brent [name], RD, LDN, CNSC.

## 2023-05-30 NOTE — Plan of Care (Signed)

## 2023-05-30 NOTE — TOC Progression Note (Signed)
Transition of Care (TOC) - Progression Note    Patient Details  Name: Brent [name]. MRN: [id number] Date of Birth: 1946-07-10  Transition of [facility]) CM/SW Contact  Mally Gavina A [place], [place] Phone Number: 05/30/2023, 3:59 PM  Clinical Narrative:     CSW was notified by provider that pt is medically stable for discharge. Bed available at facility tomorrow.   TOC will continue to follow.   Expected Discharge Plan: Skilled Nursing Facility Barriers to Discharge: Continued Medical Work up  Expected Discharge Plan and Services       Living arrangements for the past 2 months: Skilled Nursing Facility                                       Social Determinants of Health (SDOH) Interventions SDOH Screenings   Food Insecurity: No Food Insecurity (05/21/2023)  Housing: Low Risk  (05/21/2023)  Transportation Needs: No Transportation Needs (05/21/2023)  Utilities: Not At Risk (05/21/2023)  Depression (PHQ2-9): Low Risk  (12/09/2020)  Financial Resource Strain: Low Risk  (03/06/2020)  Physical Activity: Inactive (12/14/2018)  Social Connections: Unknown (12/14/2018)  Stress: No Stress Concern Present (12/14/2018)  Tobacco Use: Medium Risk (05/24/2023)    Readmission Risk Interventions     No data to display

## 2023-05-31 DIAGNOSIS — K316 Fistula of stomach and duodenum: Secondary | ICD-10-CM | POA: Diagnosis not present

## 2023-05-31 LAB — COMPREHENSIVE METABOLIC PANEL
ALT: 21 U/L (ref 0–44)
AST: 21 U/L (ref 15–41)
Albumin: 2.3 g/dL — ABNORMAL LOW (ref 3.5–5.0)
Alkaline Phosphatase: 90 U/L (ref 38–126)
Anion gap: 7 (ref 5–15)
BUN: 14 mg/dL (ref 8–23)
CO2: 24 mmol/L (ref 22–32)
Calcium: 8.1 mg/dL — ABNORMAL LOW (ref 8.9–10.3)
Chloride: 101 mmol/L (ref 98–111)
Creatinine, Ser: 0.61 mg/dL (ref 0.61–1.24)
GFR, Estimated: >60 mL/min (ref >60)
Glucose, Bld: 290 mg/dL — ABNORMAL HIGH (ref 70–99)
Potassium: 3.9 mmol/L (ref 3.5–5.1)
Sodium: 132 mmol/L — ABNORMAL LOW (ref 135–145)
Total Bilirubin: 0.3 mg/dL (ref 0.3–1.2)
Total Protein: 5.4 g/dL — ABNORMAL LOW (ref 6.5–8.1)

## 2023-05-31 LAB — GLUCOSE, CAPILLARY
Glucose-Capillary: 173 mg/dL — ABNORMAL HIGH (ref 70–99)
Glucose-Capillary: 202 mg/dL — ABNORMAL HIGH (ref 70–99)
Glucose-Capillary: 231 mg/dL — ABNORMAL HIGH (ref 70–99)
Glucose-Capillary: 231 mg/dL — ABNORMAL HIGH (ref 70–99)
Glucose-Capillary: 270 mg/dL — ABNORMAL HIGH (ref 70–99)
Glucose-Capillary: 351 mg/dL — ABNORMAL HIGH (ref 70–99)

## 2023-05-31 LAB — CBC
HCT: 31.2 % — ABNORMAL LOW (ref 39.0–52.0)
Hemoglobin: 9.6 g/dL — ABNORMAL LOW (ref 13.0–17.0)
MCH: 26.5 pg (ref 26.0–34.0)
MCHC: 30.8 g/dL (ref 30.0–36.0)
MCV: 86.2 fL (ref 80.0–100.0)
Platelets: 321 10*3/uL (ref 150–400)
RBC: 3.62 MIL/uL — ABNORMAL LOW (ref 4.22–5.81)
RDW: 15.9 % — ABNORMAL HIGH (ref 11.5–15.5)
WBC: 10.7 10*3/uL — ABNORMAL HIGH (ref 4.0–10.5)
nRBC: 0 % (ref 0.0–0.2)

## 2023-05-31 MED ORDER — LAMOTRIGINE 25 MG PO TABS: 50.0000 mg | ORAL_TABLET | Freq: Two times a day (BID) | ORAL | 0 refills | Status: DC

## 2023-05-31 MED ORDER — AMOXICILLIN-POT CLAVULANATE 875-125 MG PO TABS: 1.0000 | ORAL_TABLET | Freq: Two times a day (BID) | ORAL | 0 refills | Status: AC

## 2023-05-31 MED ORDER — ATORVASTATIN CALCIUM 40 MG PO TABS: 40.0000 mg | ORAL_TABLET | Freq: Every day | ORAL | Status: AC

## 2023-05-31 MED ORDER — GERHARDT'S BUTT CREAM: 1.0000 | TOPICAL_CREAM | Freq: Three times a day (TID) | CUTANEOUS | Status: DC

## 2023-05-31 MED ORDER — INSULIN ASPART 100 UNIT/ML IJ SOLN
5.0000 [IU] | Freq: Once | INTRAMUSCULAR | Status: AC
  Administered 2023-05-31: 5 [IU] via SUBCUTANEOUS

## 2023-05-31 MED ORDER — DULOXETINE HCL 30 MG PO CPEP: 30.0000 mg | ORAL_CAPSULE | Freq: Every evening | ORAL | 0 refills | Status: AC

## 2023-05-31 MED ORDER — TRAZODONE HCL 50 MG PO TABS: 75.0000 mg | ORAL_TABLET | Freq: Every day | ORAL | 0 refills | Status: DC

## 2023-05-31 MED ORDER — TRAMADOL HCL 50 MG PO TABS: 50.0000 mg | ORAL_TABLET | Freq: Two times a day (BID) | ORAL | 0 refills | Status: DC | PRN

## 2023-05-31 MED ORDER — ACETAMINOPHEN 500 MG PO TABS: 1000.0000 mg | ORAL_TABLET | Freq: Four times a day (QID) | ORAL | Status: AC | PRN

## 2023-05-31 MED ORDER — INSULIN GLARGINE 100 UNITS/ML SOLOSTAR PEN: 35.0000 [IU] | PEN_INJECTOR | Freq: Two times a day (BID) | SUBCUTANEOUS | Status: DC

## 2023-05-31 MED ORDER — MEDIHONEY WOUND/BURN DRESSING EX PSTE: 1.0000 | PASTE | Freq: Every day | CUTANEOUS | Status: DC

## 2023-05-31 NOTE — Plan of Care (Signed)
  Problem: Coping: Goal: Ability to adjust to condition or change in health will improve Outcome: Progressing   Problem: Fluid Volume: Goal: Ability to maintain a balanced intake and output will improve Outcome: Progressing   Problem: Health Behavior/Discharge Planning: Goal: Ability to manage health-related needs will improve Outcome: Progressing   Problem: Metabolic: Goal: Ability to maintain appropriate glucose levels will improve Outcome: Progressing   Problem: Nutritional: Goal: Maintenance of adequate nutrition will improve Outcome: Progressing   Problem: Coping: Goal: Level of anxiety will decrease Outcome: Progressing

## 2023-05-31 NOTE — Plan of Care (Signed)

## 2023-05-31 NOTE — Discharge Summary (Signed)
Physician Discharge Summary  [name]. ZOX:[id number] DOB: 16-Feb-1946 DOA: 05/21/2023  PCP: [name], MD  Admit date: 05/21/2023 Discharge date: 05/31/2023  Time spent: 46 minutes  Recommendations for Outpatient Follow-up:  Requires Chem-7, CBC in about 1 outpatient follow-up with general surgery already set up 06/12/2023 Dr. Dwain Sarna Continue indwelling suprapubic catheter for underlying quadriplegia Continue dressings as below for stage IV sacral decubiti Needs workup for undefined renal lesion 3.6 cm left kidney in the outpatient setting in the interval with an MRI once healing has occurred--PCP will be made aware  Discharge Diagnoses:  MAIN problem for hospitalization   Leaking GC fistula status post surgery this hospitalization Abscess in right upper quadrant--completing Abx as OP Quadriplegia with suprapubic Foley Stage IV decubitus ulcer Right lower extremity wound   Please see below for itemized issues addressed in HOpsital- refer to other progress notes for clarity if needed  Discharge Condition: Fair  Diet recommendation: Diabetic heart healthy  Filed Weights   05/25/23 0500 05/30/23 0606 05/31/23 0500  Weight: 97.2 kg 97.2 kg 97.3 kg    History of present illness:  77 year old white male [place] resident 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 Epimenio Foot chronically Maurice Small seen 04/25/2023] DM TY 2 CAD + angioplasty RCA 1990 BMS OHSS, OSA BMI 29 HTN HLD Undefined renal lesion Quadriplegia secondary to transverse myelitis chronically bedbound (cannot stand)--- has suprapubic catheter chronically in place with previous complicated UTI secondary to this previously treated last 02/25/2023 with Cipro             Also stage IV chronic sacral decubitus ulcers Prior gastrostomy placed 06/04/2020 status post attempted closure 02/21/2023 by general surgery (multiple times to close) Fistula enlarged with increased drainage-came to emergency room 05/21/2023  for workup-all labs normal   05/24/2023 wedge resection of stomach and gastrocutaneous fistula with lysis of adhesions by Dr. Dwain Sarna The patient was transiently on TPN which was stopped and because of purulence noted in right upper quadrant port site with blanching erythema antibiotics were started See below  Hospital Course:  Leaking gastrocutaneous fistula status post wedge resection 9/4 Dr. Dwain Sarna Postop care with twice daily wet-to-dry dressings LUQ abdominal wound and staples were removed prior to discharge Augmentin for RUQ wound abscess to complete 7 days treatment as per below  Stage IV sacral decubitus ulcer prior to admission with underlying chronic debility Wound dressings as below  Insulin-dependent diabetes mellitus Resumed home regimen of sliding scale His long-acting insulin was decreased from 40 to to 35 units twice daily and will need to go back probably to 42 units twice daily  Neuromyelitis optica, transverse myelitis previously, quadriplegia Has a neurogenic bladder with suprapubic catheter He will need to have discontinued He is on a lot of medications inclusive of baclofen and tizanidine as well as Lamictal which we have all resumed at discharge--- indication for Lamictal is unclear-this needs to be discussed with his neurologist Have discontinued his oxycodone in favor of tramadol which she should use sparingly  HTN Continue losartan and Aldactone and get labs as above   Discharge Exam: Vitals:   05/31/23 0644 05/31/23 0733  BP: (!) 134/49 (!) 145/46  Pulse: (!) 55 (!) 58  Resp: 18 16  Temp: 98.2 F (36.8 C) 97.8 F (36.6 C)  SpO2: 97% 98%    Subj on day of d/c   Awake coherent no distress  General Exam on discharge  EOMI NCAT no focal deficit no icterus no pallor Chest is clear no  added sound no wheeze no rales no rhonchi Abdomen soft no rebound no guarding He has dense quadriplegia in the lower extremities he has his right leg wrapped in a  C[place] dressing He is unable to move his legs He has a suprapubic catheter in place He has multiple dressings 1 in the right upper quadrant 1 in the left iliac region which were covered and I did not disturb S1-S2 no murmur Neck is soft supple He is coherent  Discharge Instructions    Allergies as of 05/31/2023       Reactions   Ciprofloxacin Other (See Comments)   Hallucinations   Gabapentin Other (See Comments)   Unknown   Levofloxacin Rash   Penicillins Hives, Rash   Tolerated Unasyn, Augmentin, cefepime and cephalexin        Medication List     STOP taking these medications    Alogliptin Benzoate 25 MG Tabs   B COMPLEX VITAMINS PO   chlorhexidine 4 % external liquid Commonly known as: HIBICLENS   feeding supplement (PRO-STAT 64) Liqd   fexofenadine 180 MG tablet Commonly known as: ALLEGRA   Fish Oil 1000 MG Caps   ketoconazole 2 % cream Commonly known as: NIZORAL   LORazepam 0.5 MG tablet Commonly known as: ATIVAN   Magnesium Oxide (Elemental) 400 MG Tabs   melatonin 3 MG Tabs tablet   oxyCODONE 5 MG immediate release tablet Commonly known as: Oxy IR/ROXICODONE   PSYLLIUM PO       TAKE these medications    acetaminophen 500 MG tablet Commonly known as: TYLENOL Take 2 tablets (1,000 mg total) by mouth every 6 (six) hours as needed for mild pain (or Fever >/= 101).   amoxicillin-clavulanate 875-125 MG tablet Commonly known as: AUGMENTIN Take 1 tablet by mouth every 12 (twelve) hours for 4 days.   ascorbic acid 500 MG tablet Commonly known as: VITAMIN C Take 500 mg by mouth daily.   atenolol 25 MG tablet Commonly known as: TENORMIN Take 12.5-25 mg by mouth See admin instructions. Take 12.5 mg (1/2 tablet) by mouth once daily in the morning and 25 mg (1 tablet) at bedtime.   atorvastatin 40 MG tablet Commonly known as: LIPITOR Take 1 tablet (40 mg total) by mouth at bedtime. What changed:  medication strength how much to take when  to take this   baclofen 20 MG tablet Commonly known as: LIORESAL Take 20 mg by mouth 3 (three) times daily.   Biofreeze 4 % Gel Generic drug: Menthol (Topical Analgesic) Apply 1 application  topically 2 (two) times daily as needed (Pain to back upper and lower arms and shoulders).   cholecalciferol 25 MCG (1000 UNIT) tablet Commonly known as: VITAMIN D3 Take 1,000 Units by mouth daily.   DULoxetine 30 MG capsule Commonly known as: CYMBALTA Take 1 capsule (30 mg total) by mouth at bedtime.   famotidine 20 MG tablet Commonly known as: PEPCID Take 20 mg by mouth 2 (two) times daily.   fluticasone 50 MCG/ACT nasal spray Commonly known as: FLONASE Place 2 sprays into both nostrils daily.   GenTeal Tears Severe Day/Night 0.4-0.3 % Gel ophthalmic gel Generic drug: Polyethyl Glycol-Propyl Glycol Place 1 Application into both eyes 4 (four) times daily as needed (Dry Eyes).   Gerhardt's butt cream Crea Apply 1 Application topically 3 (three) times daily.   insulin aspart 100 UNIT/ML injection Commonly known as: novoLOG Inject 0-15 Units into the skin every 4 (four) hours. Sliding scale  CBG 70 -  120: 0 units: CBG 121 - 150: 2 units; CBG 151 - 200: 3 units; CBG 201 - 250: 5 units; CBG 251 - 300: 8 units;CBG 301 - 350: 11 units; CBG 351 - 400: 15 units; CBG > 400 : 15 units and notify MD What changed:  how much to take when to take this additional instructions   insulin glargine 100 unit/mL Sopn Commonly known as: LANTUS Inject 35 Units into the skin 2 (two) times daily. What changed:  how much to take when to take this   lamoTRIgine 25 MG tablet Commonly known as: LAMICTAL Take 2 tablets (50 mg total) by mouth 2 (two) times daily. For 5 days. Started 02/18/23.   leptospermum manuka honey Pste paste Apply 1 Application topically daily. Clean R plantar foot wound with NS, apply Medihoney to wound bed daily, cover with dry gauze and secure with Kerlix roll gauze.  Apply thin  layer (3 mm) to wound.   losartan 50 MG tablet Commonly known as: COZAAR Take 75 mg by mouth daily.   metFORMIN 500 MG tablet Commonly known as: GLUCOPHAGE Take 1,000 mg by mouth 2 (two) times daily with a meal.   nystatin powder Apply 1 Application topically daily. Apply to peri-wound bed to sacrum and right ischium daily with routine dressing.   omeprazole 40 MG capsule Commonly known as: PRILOSEC Take 1 capsule (40 mg total) by mouth 2 (two) times daily. 30 minutes before breakfast/dinner What changed: additional instructions   ondansetron 4 MG tablet Commonly known as: ZOFRAN Take 4 mg by mouth every 8 (eight) hours as needed for nausea.   senna-docusate 8.6-50 MG tablet Commonly known as: Senokot-S Take 1 tablet by mouth 2 (two) times daily.   spironolactone 25 MG tablet Commonly known as: ALDACTONE Take 25 mg by mouth daily.   tiZANidine 2 MG tablet Commonly known as: ZANAFLEX Take 2 mg by mouth at bedtime.   traMADol 50 MG tablet Commonly known as: ULTRAM Take 1 tablet (50 mg total) by mouth every 12 (twelve) hours as needed for moderate pain or severe pain (Pain). What changed:  when to take this reasons to take this   traZODone 50 MG tablet Commonly known as: DESYREL Take 1.5 tablets (75 mg total) by mouth at bedtime.               Discharge Care Instructions  (From admission, onward)           Start     Ordered   05/31/23 0000  Discharge wound care:       Comments: Start     Ordered   05/23/23 0500    Wound care  Daily      Comments: Clean R ischial wound with NS, apply Silver Hydrofiber [name] [phone]) cut to fit wound bed daily and cover with silicone foam or ABD pad  05/22/23 0951   05/22/23 0957    Wound care  Every shift      Comments: Clean buttocks/sacrum/coccyx with soap and water, apply Gerhardt's Butt Cream 3 times daily and prn soiling.  May cover area with ABD pad.  Would leave silicone foam off.  05/22/23 0957   05/22/23  0955    Wound care  Daily      Comments: Clean R plantar foot wound with NS, apply Medihoney to wound bed daily, cover with dry gauze and secure with Kerlix roll gauze.  May wrap both legs with Kerlix roll gauze beginning just above toes and ending right  below knees.  Secure with Ace wrap in same fashion for light compression  05/22/23 0954   05/31/23 0930           Allergies  Allergen Reactions   Ciprofloxacin Other (See Comments)    Hallucinations   Gabapentin Other (See Comments)    Unknown   Levofloxacin Rash   Penicillins Hives and Rash    Tolerated Unasyn, Augmentin, cefepime and cephalexin    Follow-up Information     Emelia Loron, MD. Go on 06/12/2023.   Specialty: General Surgery Why: Your appointment is 9/23 at 11:10am Please arrive 15 minutes early for check in. Contact information: [address] [place] [address] [phone]                  The results of significant diagnostics from this hospitalization (including imaging, microbiology, ancillary and laboratory) are listed below for reference.    Significant Diagnostic Studies: DG CHEST PORT 1 VIEW  Result Date: 05/27/2023 CLINICAL DATA:  Pneumonia. EXAM: PORTABLE CHEST 1 VIEW COMPARISON:  March 07, 2023. FINDINGS: Stable cardiomegaly. Minimal bibasilar subsegmental atelectasis or scarring is noted. Right-sided PICC line is noted with distal tip in expected position of the SVC. Bony thorax is unremarkable. IMPRESSION: Minimal bibasilar subsegmental atelectasis or scarring. Electronically Signed   By: Lupita Raider M.D.   On: 05/27/2023 14:50   DG Abd Portable 1V  Result Date: 05/25/2023 CLINICAL DATA:  Ileus following gastrointestinal surgery. EXAM: PORTABLE ABDOMEN - 1 VIEW COMPARISON:  CT abdomen 12/08/2022 and CT abdomen 05/21/2023 FINDINGS: Patient has a left lower abdominal ostomy. Gas-filled loops small bowel in the mid and right abdomen. Small bowel measures close to  3.0 cm. Again noted is high density material / contrast involving the left kidney and this finding has been present on multiple examinations. Few densities at the left costophrenic angle could be associated with atelectasis or overlying structures. IMPRESSION: 1. Gas-filled loops of small bowel in the mid and right abdomen. Findings are nonspecific but could represent a postoperative ileus. Electronically Signed   By: Richarda Overlie M.D.   On: 05/25/2023 14:03   PERIPHERAL VASCULAR CATHETERIZATION  Result Date: 05/24/2023 See surgical note for result.  [place] EKG SITE RITE  Result Date: 05/22/2023 If Site Rite image not attached, placement could not be confirmed due to current cardiac rhythm.  CT ABDOMEN PELVIS W CONTRAST  Result Date: 05/21/2023 CLINICAL DATA:  Abdominal pain near fistula/gastric tube site. EXAM: CT ABDOMEN AND PELVIS WITH CONTRAST TECHNIQUE: Multidetector CT imaging of the abdomen and pelvis was performed using the standard protocol following bolus administration of intravenous contrast. RADIATION DOSE REDUCTION: This exam was performed according to the departmental dose-optimization program which includes automated exposure control, adjustment of the mA and/or kV according to patient size and/or use of iterative reconstruction technique. CONTRAST:  75mL OMNIPAQUE IOHEXOL 350 MG/ML SOLN COMPARISON:  CT abdomen and pelvis dated 12/08/2022 and CT abdomen and pelvis dated 03/25/2022. FINDINGS: Lower chest: Trace bilateral pleural effusions with associated atelectasis are partially imaged. Hepatobiliary: No focal liver abnormality is seen. Status post cholecystectomy. No biliary dilatation. Pancreas: Unremarkable. No pancreatic ductal dilatation or surrounding inflammatory changes. Spleen: Normal in size without focal abnormality. Adrenals/Urinary Tract: Adrenal glands are unremarkable. Hyperdensity measuring 3.0 cm in the left kidney likely reflects retained contrast. A left renal cyst measures 3.6  cm. No imaging follow-up is recommended for this finding. No renal calculi or hydronephrosis on either side. A suprapubic catheter is noted and  the urinary bladder is deflated. Stomach/Bowel: A gastro-cutaneous fistula along the anterior aspect of the gastric body is redemonstrated. No significant associated fat stranding or inflammatory changes to suggest infection. No evidence of intraperitoneal leakage or abscess formation. The patient is status post a left lower quadrant loop colostomy. Appendix appears normal. There is colonic diverticulosis without evidence of diverticulitis. No evidence of bowel wall thickening, distention, or inflammatory changes. Vascular/Lymphatic: Aortic atherosclerosis. No enlarged abdominal or pelvic lymph nodes. Reproductive: Prostate is unremarkable. Other: No abdominal wall hernia or abnormality. No abdominopelvic ascites. Musculoskeletal: Degenerative changes are seen in the spine. Chronic decubitus ulcer involving the right ischium as well as chronic deformity of the sacrum and coccyx appear similar to prior exam. IMPRESSION: 1. Redemonstration of a gastro-cutaneous fistula without evidence of intraperitoneal leakage or associated inflammatory changes. 2. Trace bilateral pleural effusions with associated atelectasis. Aortic Atherosclerosis (ICD10-I70.0). Electronically Signed   By: Romona Curls M.D.   On: 05/21/2023 16:21    Microbiology: Recent Results (from the past 240 hour(s))  Surgical pcr screen     Status: Abnormal   Collection Time: 05/24/23  8:35 AM   Specimen: Nasal Mucosa; Nasal Swab  Result Value Ref Range Status   MRSA, PCR POSITIVE (A) NEGATIVE Final    Comment: RESULT CALLED TO, READ BACK BY AND VERIFIED WITH: RN AMY E 1230 [id number] FCP    Staphylococcus aureus POSITIVE (A) NEGATIVE Final    Comment: (NOTE) The Xpert SA Assay (FDA approved for NASAL specimens in patients 14 years of age and older), is one component of a comprehensive surveillance  program. It is not intended to diagnose infection nor to guide or monitor treatment. Performed at [facility], [address] [place], [place] [address]   Urine Culture     Status: Abnormal   Collection Time: 05/27/23  8:13 AM   Specimen: Urine, Random  Result Value Ref Range Status   Specimen Description URINE, RANDOM  Final   Special Requests   Final    NONE Reflexed from [id number] Performed at [facility], [address] [place], [place] [address]    Culture >=100,000 COLONIES/mL KLEBSIELLA PNEUMONIAE (A)  Final   Report Status 05/30/2023 FINAL  Final   Organism ID, Bacteria KLEBSIELLA PNEUMONIAE (A)  Final      Susceptibility   Klebsiella pneumoniae - MIC*    AMPICILLIN RESISTANT Resistant     CEFAZOLIN <=4 SENSITIVE Sensitive     CEFEPIME <=0.12 SENSITIVE Sensitive     CEFTRIAXONE <=0.25 SENSITIVE Sensitive     CIPROFLOXACIN <=0.25 SENSITIVE Sensitive     GENTAMICIN <=1 SENSITIVE Sensitive     IMIPENEM <=0.25 SENSITIVE Sensitive     NITROFURANTOIN <=16 SENSITIVE Sensitive     TRIMETH/SULFA <=20 SENSITIVE Sensitive     AMPICILLIN/SULBACTAM 4 SENSITIVE Sensitive     PIP/TAZO <=4 SENSITIVE Sensitive     * >=100,000 COLONIES/mL KLEBSIELLA PNEUMONIAE     Labs: Basic Metabolic Panel: Recent Labs  Lab 05/25/23 0409 05/26/23 0311 05/27/23 0320 05/28/23 0408 05/29/23 0340 05/31/23 0252  NA 134* 136  --  133* 132* 132*  K 4.8 4.5  --  3.7 4.1 3.9  CL 102 101  --  99 99 101  CO2 22 23  --  25 25 24   GLUCOSE 284* 269*  --  267* 289* 290*  BUN 18 22  --  13 14 14   CREATININE 0.72 0.71  --  0.53* 0.62 0.61  CALCIUM 8.4* 8.7*  --  8.5* 8.7* 8.1*  MG 2.1 2.0  --   --  1.7  --   PHOS 2.9 2.4* 3.0  --  4.1  --    Liver Function Tests: Recent Labs  Lab 05/25/23 0409 05/28/23 0408 05/29/23 0340 05/31/23 0252  AST 23 16 15 21   ALT 28 21 19 21   ALKPHOS 65 76 80 90  BILITOT 0.5 0.4 0.3 0.3  PROT 5.8* 5.5* 5.5* 5.4*  ALBUMIN 2.9* 2.4* 2.3* 2.3*   No  results for input(s): "LIPASE", "AMYLASE" in the last 168 hours. No results for input(s): "AMMONIA" in the last 168 hours. CBC: Recent Labs  Lab 05/26/23 0311 05/27/23 0320 05/28/23 0408 05/30/23 0311 05/31/23 0252  WBC 10.7* 8.4 7.7 9.6 10.7*  NEUTROABS 7.9*  --   --   --   --   HGB 10.1* 9.8* 9.7* 9.4* 9.6*  HCT 33.6* 32.1* 31.0* 30.9* 31.2*  MCV 88.4 89.7 88.3 86.3 86.2  PLT 281 244 257 343 321   Cardiac Enzymes: No results for input(s): "CKTOTAL", "CKMB", "CKMBINDEX", "TROPONINI" in the last 168 hours. BNP: BNP (last 3 results) Recent Labs    02/22/23 0636 02/23/23 0717  BNP 91.3 77.0    ProBNP (last 3 results) No results for input(s): "PROBNP" in the last 8760 hours.  CBG: Recent Labs  Lab 05/30/23 1700 05/30/23 2122 05/31/23 0051 05/31/23 0359 05/31/23 0731  GLUCAP 226* 245* 351* 231* 202*       Signed:  Rhetta Mura MD   [facility] 05/31/2023, 9:23 AM

## 2023-05-31 NOTE — Progress Notes (Addendum)
RN was trying to call the report to the [facility] 2 times. Operator was unable to connect with the nurse.  Later RN got a call from [facility] and gave them report.  Patient is allocated for Room 401P.

## 2023-05-31 NOTE — TOC Transition Note (Signed)
Transition of Care [facility]) - CM/SW Discharge Note   Patient Details  Name: Brent [name]. MRN: [id number] Date of Birth: Aug 23, 1946  [facility] of [facility] CM/SW Contact:  Amna Welker A [place], LCSWA Phone Number: 05/31/2023, 2:10 PM   Clinical Narrative:     Patient will DC to: [facility]  Anticipated DC date: 05/31/23  Family notified: Kathi Der  Transport by: Sharin Mons      Per MD patient ready for DC to [facility]. RN, patient, patient's family, and facility notified of DC. Discharge Summary and FL2 sent to facility. RN to call report prior to discharge ( [phone], room 401p). DC packet on chart. Ambulance transport requested for patient.     CSW will sign off for now as social work intervention is no longer needed. Please consult [place] again if new needs arise.    Final next level of care: Skilled Nursing Facility Barriers to Discharge: Barriers Resolved   Patient Goals and CMS Choice      Discharge Placement                Patient chooses bed at: [facility] Patient to be transferred to facility by: [facility] Name of family member notified: Orlean Aresco Patient and family notified of of transfer: 05/31/23  Discharge Plan and Services Additional resources added to the After Visit Summary for                                       Social Determinants of Health (SDOH) Interventions SDOH Screenings   Food Insecurity: No Food Insecurity (05/21/2023)  Housing: Low Risk  (05/21/2023)  Transportation Needs: No Transportation Needs (05/21/2023)  Utilities: Not At Risk (05/21/2023)  Depression (PHQ2-9): Low Risk  (12/09/2020)  Financial Resource Strain: Low Risk  (03/06/2020)  Physical Activity: Inactive (12/14/2018)  Social Connections: Unknown (12/14/2018)  Stress: No Stress Concern Present (12/14/2018)  Tobacco Use: Medium Risk (05/24/2023)     Readmission Risk Interventions     No data to display

## 2023-05-31 NOTE — Progress Notes (Cosign Needed Addendum)
7 Days Post-Op   Subjective/Chief Complaint: No acute changes.  Objective: Vital signs in last 24 hours: Temp:  [97.8 F (36.6 C)-98.4 F (36.9 C)] 97.8 F (36.6 C) (09/11 0733) Pulse Rate:  [55-62] 58 (09/11 0733) Resp:  [16-18] 16 (09/11 0733) BP: (134-156)/(46-53) 145/46 (09/11 0733) SpO2:  [97 %-100 %] 98 % (09/11 0733) Weight:  [97.3 kg] 97.3 kg (09/11 0500) Last BM Date : 05/31/23  Intake/Output from previous day: 09/10 0701 - 09/11 0700 In: 1021 [P.O.:125; I.V.:896] Out: 3850 [Urine:3700; Stool:150] Intake/Output this shift: No intake/output data recorded.  Ab soft gc fistula site without infection, ostomy with stool and a lot of air RUQ port site with interval decrease in cellulitis, re-packedw/ iodoform  Lab Results:  Recent Labs    05/30/23 0311 05/31/23 0252  WBC 9.6 10.7*  HGB 9.4* 9.6*  HCT 30.9* 31.2*  PLT 343 321   BMET Recent Labs    05/29/23 0340 05/31/23 0252  NA 132* 132*  K 4.1 3.9  CL 99 101  CO2 25 24  GLUCOSE 289* 290*  BUN 14 14  CREATININE 0.62 0.61  CALCIUM 8.7* 8.1*   PT/INR No results for input(s): "LABPROT", "INR" in the last 72 hours. ABG No results for input(s): "PHART", "HCO3" in the last 72 hours.  Invalid input(s): "PCO2", "PO2"  Studies/Results: No results found.  Anti-infectives: Anti-infectives (From admission, onward)    Start     Dose/Rate Route Frequency Ordered Stop   05/30/23 2200  amoxicillin-clavulanate (AUGMENTIN) 875-125 MG per tablet 1 tablet        1 tablet Oral Every 12 hours 05/30/23 1356 06/06/23 2159   05/29/23 1045  amoxicillin-clavulanate (AUGMENTIN) 875-125 MG per tablet 1 tablet  Status:  Discontinued        1 tablet Oral Every 12 hours 05/29/23 0957 05/30/23 1356       Assessment/Plan: -POD#6 s/p Diagnostic laparoscopy with lysis of adhesions, gastric wedge resection of gastrocutaneous fistula 9/4 Dr. Dwain Sarna - advance to regular diet. - stable for discharge from a surgical  perspective. Would complete a total of 1 week Augmentin for RUQ cellulitis, wound care instructions for LUQ and RUQ wounds provided in discharge instructions. Follow up with Dr. Dwain Sarna arranged for 9/23. Appears plan is for patient to return to SNF, likely today.  - placed nursing order for staples to be removed from LUQ wound prior to discharge    ID - Augmentin FEN - IVF, fulls VTE - lovenox Foley - SP tube    Functional quadriplegia due to neurodegenerative disorder/transverse myelitis, neuromyelitis optica on Rituxan (last infusion May, gets this q6 months), followed by Dr. Epimenio Foot of [facility], he should not get rituxan until luq wound heals IDDM HTN HLD Neurogenic bladder with SP tube   Adam Phenix 05/31/2023

## 2023-05-31 NOTE — [place] FL2 (Signed)
[place] MEDICAID FL2 LEVEL OF CARE FORM     IDENTIFICATION  Patient Name: Brent [name]. Birthdate: 02-03-1946 Sex: male Admission Date (Current Location): 05/21/2023  [facility] and [place] Number:  Producer, television/film/video and Address:  [employer], [address] [place], [place] [address]      Provider Number: [id number]  Attending Physician Name and Address:  Rhetta Mura, MD  Relative Name and Phone Number:       Current Level of Care: Hospital Recommended Level of Care: Skilled Nursing Facility Prior Approval Number:    Date Approved/Denied:   PASRR Number: [id number]  Discharge Plan: SNF    Current Diagnoses: Patient Active Problem List   Diagnosis Date Noted   Dehydration 02/21/2023   Sepsis (HCC) 02/21/2023   Acute metabolic encephalopathy 02/21/2023   Lactic acidosis 02/21/2023   Type 2 diabetes mellitus with complication, with long-term current use of insulin (HCC) 02/21/2023   Normocytic anemia 02/21/2023   Primary malignant neuroendocrine tumor of stomach (HCC) 05/27/2022   Preoperative examination 05/27/2022   Abnormal endoscopy of upper gastrointestinal tract 05/27/2022   Benign neuroendocrine tumor of stomach  04/04/2022   Mucosal abnormality of stomach    Chronic osteomyelitis of pelvic region, right (HCC) 03/25/2022   Gastrocutaneous fistula 03/25/2022   Complicated urinary tract infection    Catheter-associated urinary tract infection (HCC) 08/06/2021   Osteomyelitis of pelvic region, acute, right (HCC) 03/24/2021   Right ischial pressure sore 02/10/2021   Hypokalemia    Encounter for monitoring immunomodulating therapy 08/20/2020   High risk medication use 08/20/2020   Malnutrition of moderate degree 06/04/2020   Palliative care by specialist    Goals of care, counseling/discussion    FTT (failure to thrive) in adult 06/01/2020   Abdominal pain    Sacral ulcer, with necrosis of muscle (HCC) 04/09/2020    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HCC)    Transverse myelitis (HCC)    Labile blood pressure    Labile blood glucose    Neurogenic bladder    Neurogenic bowel    Transaminitis    Controlled type 2 diabetes mellitus with hyperglycemia, with long-term current use of insulin (HCC)    Quadriplegia (HCC)    Dyslipidemia    Paraplegia (HCC)    Coronary artery disease involving native coronary artery of native heart without angina pectoris    Steroid-induced hyperglycemia    Diabetic peripheral neuropathy (HCC)    Weakness 03/09/2020   Chest pain 03/08/2020   Right leg numbness    Malignant melanoma of left side of neck (HCC) 10/25/2018   Facial neuritis 10/25/2018   Myofascial pain syndrome 10/25/2018   Occipital neuralgia of left side 10/25/2018   Male hypogonadism 06/20/2016   Renal lesion 06/20/2016   Insomnia 08/07/2015   Enlarged prostate with lower urinary tract symptoms (LUTS) 11/07/2014   Balanitis 07/30/2012   Bladder neck obstruction 07/30/2012   Prostate nodule 06/18/2012   Recurrent nephrolithiasis 06/18/2012   Benign neoplasm of colon 08/29/2011   DEPRESSION, SITUATIONAL, ACUTE 01/23/2010   Essential hypertension 05/30/2009   Coronary atherosclerosis 05/30/2009   Obstructive sleep apnea 11/28/2008   OBESITY 09/23/2008   ERECTILE DYSFUNCTION 11/26/2007   Well controlled type 2 diabetes mellitus with peripheral circulatory disorder (HCC) 05/09/2007   Hyperlipidemia 05/09/2007   MYOCARDIAL INFARCTION, HX OF 05/09/2007   DIVERTICULOSIS, COLON 05/09/2007    Orientation RESPIRATION BLADDER Height & Weight     Self, Time, Situation, Place  Normal Indwelling catheter, Incontinent Weight: 214  lb 8.1 oz (97.3 kg) Height:  6' (182.9 cm)  BEHAVIORAL SYMPTOMS/MOOD NEUROLOGICAL BOWEL NUTRITION STATUS      Colostomy, Continent Diet (see discharge summary)  AMBULATORY STATUS COMMUNICATION OF NEEDS Skin   Extensive Assist Verbally Other (Comment) (PU Stage and Appropriate Care (Pressure  injury from prior encounter))                       Personal Care Assistance Level of Assistance  Bathing, Feeding, Dressing Bathing Assistance: Maximum assistance Feeding assistance: Limited assistance Dressing Assistance: Maximum assistance     Functional Limitations Info  Sight, Hearing, Speech Sight Info: Impaired (wears glasses) Hearing Info: Adequate Speech Info: Adequate    SPECIAL CARE FACTORS FREQUENCY                       Contractures Contractures Info: Not present    Additional Factors Info  Code Status, Allergies Code Status Info: FULL Allergies Info: Gabapentin, Levofloxacin, Penicillins           Current Medications (05/31/2023):  This is the current hospital active medication list Current Facility-Administered Medications  Medication Dose Route Frequency Provider Last Rate Last Admin   0.9 %  sodium chloride infusion   Intravenous Continuous Jacinto Halim, PA-C 15 mL/hr at 05/30/23 1326 New Bag at 05/30/23 1326   acetaminophen (TYLENOL) tablet 1,000 mg  1,000 mg Oral Q6H PRN Jacinto Halim, PA-C   1,000 mg at 05/30/23 0231   Or   acetaminophen (TYLENOL) suppository 650 mg  650 mg Rectal Q6H PRN Jacinto Halim, PA-C       amoxicillin-clavulanate (AUGMENTIN) 875-125 MG per tablet 1 tablet  1 tablet Oral Q12H Jerald Kief, MD   1 tablet at 05/31/23 1050   atenolol (TENORMIN) tablet 12.5 mg  12.5 mg Oral Daily Jerald Kief, MD   12.5 mg at 05/31/23 1050   And   atenolol (TENORMIN) tablet 25 mg  25 mg Oral QHS Jerald Kief, MD   25 mg at 05/30/23 2206   atorvastatin (LIPITOR) tablet 40 mg  40 mg Oral QHS Jacinto Halim, PA-C   40 mg at 05/30/23 2203   baclofen (LIORESAL) tablet 20 mg  20 mg Oral TID Jacinto Halim, PA-C   20 mg at 05/31/23 1050   Chlorhexidine Gluconate Cloth 2 % PADS 6 each  6 each Topical Daily Jacinto Halim, PA-C   6 each at 05/31/23 1051   DULoxetine (CYMBALTA) DR capsule 30 mg  30 mg Oral QHS Jacinto Halim, PA-C   30 mg at 05/30/23 2202   enoxaparin (LOVENOX) injection 40 mg  40 mg Subcutaneous Q24H Jacinto Halim, PA-C   40 mg at 05/30/23 2204   famotidine (PEPCID) tablet 20 mg  20 mg Oral BID Jacinto Halim, PA-C   20 mg at 05/31/23 1050   Gerhardt's butt cream   Topical TID Jacinto Halim, PA-C   Given at 05/31/23 1051   hydrALAZINE (APRESOLINE) injection 5 mg  5 mg Intravenous Q4H PRN Jacinto Halim, PA-C   5 mg at 05/24/23 2029   insulin aspart (novoLOG) injection 0-15 Units  0-15 Units Subcutaneous TID WC Jerald Kief, MD   5 Units at 05/31/23 1610   insulin aspart (novoLOG) injection 0-5 Units  0-5 Units Subcutaneous QHS Jerald Kief, MD   2 Units at 05/30/23 2209   insulin aspart (novoLOG) injection 5 Units  5 Units Subcutaneous TID WC Jerald Kief, MD   5 Units at 05/31/23 0813   insulin glargine-yfgn (SEMGLEE) injection 35 Units  35 Units Subcutaneous BID Jerald Kief, MD   35 Units at 05/31/23 1114   leptospermum manuka honey (MEDIHONEY) paste 1 Application  1 Application Topical Daily Jacinto Halim, PA-C   1 Application at 05/31/23 1051   losartan (COZAAR) tablet 75 mg  75 mg Oral Daily Jacinto Halim, PA-C   75 mg at 05/31/23 1051   morphine (PF) 2 MG/ML injection 2 mg  2 mg Intravenous Q2H PRN Jacinto Halim, PA-C   2 mg at 05/25/23 0107   ondansetron (ZOFRAN) tablet 4 mg  4 mg Per Tube Q6H PRN Jacinto Halim, PA-C       Or   ondansetron [facility]) injection 4 mg  4 mg Intravenous Q6H PRN Jacinto Halim, PA-C   4 mg at 05/24/23 1755   pantoprazole (PROTONIX) EC tablet 40 mg  40 mg Oral Daily Jacinto Halim, PA-C   40 mg at 05/31/23 1050   polyvinyl alcohol (LIQUIFILM TEARS) 1.4 % ophthalmic solution 1 drop  1 drop Both Eyes QID PRN Jacinto Halim, PA-C   1 drop at 05/22/23 0914   senna-docusate (Senokot-S) tablet 1 tablet  1 tablet Oral BID Meuth, Brooke A, PA-C   1 tablet at 05/31/23 1050   sodium chloride flush (NS) 0.9 % injection  10-40 mL  10-40 mL Intracatheter Q12H Jacinto Halim, PA-C   10 mL at 05/30/23 2213   sodium chloride flush (NS) 0.9 % injection 10-40 mL  10-40 mL Intracatheter PRN Maczis, Elmer Sow, PA-C       spironolactone (ALDACTONE) tablet 25 mg  25 mg Oral Daily Jerald Kief, MD   25 mg at 05/31/23 1050   traMADol (ULTRAM) tablet 50 mg  50 mg Oral Q12H PRN Emelia Loron, MD   50 mg at 05/29/23 2317   traZODone (DESYREL) tablet 75 mg  75 mg Oral QHS Jacinto Halim, PA-C   75 mg at 05/30/23 2203     Discharge Medications: Please see discharge summary for a list of discharge medications.  Relevant Imaging Results:  Relevant Lab Results:   Additional Information [id number]  Zyire Eidson A [place], [place]

## 2023-06-05 ENCOUNTER — Emergency Department (HOSPITAL_COMMUNITY): Payer: Medicare Other

## 2023-06-05 ENCOUNTER — Encounter (HOSPITAL_COMMUNITY): Payer: Self-pay | Admitting: Internal Medicine

## 2023-06-05 ENCOUNTER — Other Ambulatory Visit: Payer: Self-pay

## 2023-06-05 ENCOUNTER — Observation Stay (HOSPITAL_COMMUNITY)
Admission: EM | Admit: 2023-06-05 | Discharge: 2023-06-06 | Disposition: A | Discharge status: Home / Self Care | Payer: Medicare Other | Attending: Internal Medicine | Admitting: Internal Medicine

## 2023-06-05 DIAGNOSIS — E785 Hyperlipidemia, unspecified: Secondary | ICD-10-CM

## 2023-06-05 DIAGNOSIS — E871 Hypo-osmolality and hyponatremia: Secondary | ICD-10-CM | POA: Diagnosis not present

## 2023-06-05 DIAGNOSIS — K632 Fistula of intestine: Secondary | ICD-10-CM | POA: Diagnosis not present

## 2023-06-05 DIAGNOSIS — E119 Type 2 diabetes mellitus without complications: Secondary | ICD-10-CM | POA: Diagnosis not present

## 2023-06-05 DIAGNOSIS — E11621 Type 2 diabetes mellitus with foot ulcer: Secondary | ICD-10-CM | POA: Diagnosis not present

## 2023-06-05 DIAGNOSIS — Z9889 Other specified postprocedural states: Secondary | ICD-10-CM | POA: Insufficient documentation

## 2023-06-05 DIAGNOSIS — R109 Unspecified abdominal pain: Secondary | ICD-10-CM | POA: Diagnosis present

## 2023-06-05 DIAGNOSIS — Z79899 Other long term (current) drug therapy: Secondary | ICD-10-CM | POA: Diagnosis not present

## 2023-06-05 DIAGNOSIS — Z794 Long term (current) use of insulin: Secondary | ICD-10-CM

## 2023-06-05 DIAGNOSIS — I10 Essential (primary) hypertension: Secondary | ICD-10-CM | POA: Diagnosis not present

## 2023-06-05 DIAGNOSIS — Z87891 Personal history of nicotine dependence: Secondary | ICD-10-CM | POA: Diagnosis not present

## 2023-06-05 DIAGNOSIS — Z7901 Long term (current) use of anticoagulants: Secondary | ICD-10-CM | POA: Insufficient documentation

## 2023-06-05 DIAGNOSIS — K316 Fistula of stomach and duodenum: Principal | ICD-10-CM | POA: Diagnosis present

## 2023-06-05 DIAGNOSIS — E872 Acidosis, unspecified: Secondary | ICD-10-CM

## 2023-06-05 DIAGNOSIS — I2510 Atherosclerotic heart disease of native coronary artery without angina pectoris: Secondary | ICD-10-CM | POA: Insufficient documentation

## 2023-06-05 DIAGNOSIS — R6510 Systemic inflammatory response syndrome (SIRS) of non-infectious origin without acute organ dysfunction: Secondary | ICD-10-CM

## 2023-06-05 DIAGNOSIS — Z9359 Other cystostomy status: Secondary | ICD-10-CM

## 2023-06-05 DIAGNOSIS — F411 Generalized anxiety disorder: Secondary | ICD-10-CM

## 2023-06-05 DIAGNOSIS — Z7984 Long term (current) use of oral hypoglycemic drugs: Secondary | ICD-10-CM | POA: Diagnosis not present

## 2023-06-05 DIAGNOSIS — G8250 Quadriplegia, unspecified: Secondary | ICD-10-CM | POA: Diagnosis present

## 2023-06-05 DIAGNOSIS — L97509 Non-pressure chronic ulcer of other part of unspecified foot with unspecified severity: Secondary | ICD-10-CM | POA: Insufficient documentation

## 2023-06-05 DIAGNOSIS — L89154 Pressure ulcer of sacral region, stage 4: Secondary | ICD-10-CM | POA: Diagnosis present

## 2023-06-05 DIAGNOSIS — N319 Neuromuscular dysfunction of bladder, unspecified: Secondary | ICD-10-CM | POA: Diagnosis present

## 2023-06-05 LAB — URINALYSIS, ROUTINE W REFLEX MICROSCOPIC
Bacteria, UA: NONE SEEN
Bilirubin Urine: NEGATIVE
Glucose, UA: 50 mg/dL — AB
Hgb urine dipstick: NEGATIVE
Ketones, ur: NEGATIVE mg/dL
Nitrite: NEGATIVE
Protein, ur: 30 mg/dL — AB
Specific Gravity, Urine: 1.032 — ABNORMAL HIGH (ref 1.005–1.030)
pH: 5 (ref 5.0–8.0)

## 2023-06-05 LAB — CBC WITH DIFFERENTIAL/PLATELET
Abs Immature Granulocytes: 0.2 10*3/uL — ABNORMAL HIGH (ref 0.00–0.07)
Basophils Absolute: 0.1 10*3/uL (ref 0.0–0.1)
Basophils Relative: 0 %
Eosinophils Absolute: 0.5 10*3/uL (ref 0.0–0.5)
Eosinophils Relative: 3 %
HCT: 36.3 % — ABNORMAL LOW (ref 39.0–52.0)
Hemoglobin: 11 g/dL — ABNORMAL LOW (ref 13.0–17.0)
Immature Granulocytes: 1 %
Lymphocytes Relative: 11 %
Lymphs Abs: 1.8 10*3/uL (ref 0.7–4.0)
MCH: 27.2 pg (ref 26.0–34.0)
MCHC: 30.3 g/dL (ref 30.0–36.0)
MCV: 89.9 fL (ref 80.0–100.0)
Monocytes Absolute: 0.8 10*3/uL (ref 0.1–1.0)
Monocytes Relative: 5 %
Neutro Abs: 12.7 10*3/uL — ABNORMAL HIGH (ref 1.7–7.7)
Neutrophils Relative %: 80 %
Platelets: 384 10*3/uL (ref 150–400)
RBC: 4.04 MIL/uL — ABNORMAL LOW (ref 4.22–5.81)
RDW: 16.1 % — ABNORMAL HIGH (ref 11.5–15.5)
WBC: 16 10*3/uL — ABNORMAL HIGH (ref 4.0–10.5)
nRBC: 0 % (ref 0.0–0.2)

## 2023-06-05 LAB — COMPREHENSIVE METABOLIC PANEL
ALT: 25 U/L (ref 0–44)
AST: 25 U/L (ref 15–41)
Albumin: 2.8 g/dL — ABNORMAL LOW (ref 3.5–5.0)
Alkaline Phosphatase: 103 U/L (ref 38–126)
Anion gap: 10 (ref 5–15)
BUN: 20 mg/dL (ref 8–23)
CO2: 19 mmol/L — ABNORMAL LOW (ref 22–32)
Calcium: 8.5 mg/dL — ABNORMAL LOW (ref 8.9–10.3)
Chloride: 105 mmol/L (ref 98–111)
Creatinine, Ser: 0.74 mg/dL (ref 0.61–1.24)
GFR, Estimated: >60 mL/min (ref >60)
Glucose, Bld: 233 mg/dL — ABNORMAL HIGH (ref 70–99)
Potassium: 4.4 mmol/L (ref 3.5–5.1)
Sodium: 134 mmol/L — ABNORMAL LOW (ref 135–145)
Total Bilirubin: 0.4 mg/dL (ref 0.3–1.2)
Total Protein: 6.3 g/dL — ABNORMAL LOW (ref 6.5–8.1)

## 2023-06-05 LAB — LACTIC ACID, PLASMA
Lactic Acid, Venous: 2.4 mmol/L (ref 0.5–1.9)
Lactic Acid, Venous: 2.5 mmol/L (ref 0.5–1.9)

## 2023-06-05 LAB — CBG MONITORING, ED: Glucose-Capillary: 126 mg/dL — ABNORMAL HIGH (ref 70–99)

## 2023-06-05 LAB — LIPASE, BLOOD: Lipase: 36 U/L (ref 11–51)

## 2023-06-05 MED ORDER — SODIUM CHLORIDE 0.9% FLUSH
3.0000 mL | Freq: Two times a day (BID) | INTRAVENOUS | Status: DC
  Administered 2023-06-06: 3 mL via INTRAVENOUS

## 2023-06-05 MED ORDER — LAMOTRIGINE 25 MG PO TABS
50.0000 mg | ORAL_TABLET | Freq: Two times a day (BID) | ORAL | Status: DC
  Administered 2023-06-05 – 2023-06-06 (×2): 50 mg via ORAL
  Filled 2023-06-05 (×2): qty 2

## 2023-06-05 MED ORDER — OXYCODONE HCL 5 MG PO TABS
5.0000 mg | ORAL_TABLET | Freq: Two times a day (BID) | ORAL | Status: DC | PRN
  Administered 2023-06-06: 5 mg via ORAL
  Filled 2023-06-05: qty 1

## 2023-06-05 MED ORDER — ENOXAPARIN SODIUM 40 MG/0.4ML IJ SOSY
40.0000 mg | PREFILLED_SYRINGE | INTRAMUSCULAR | Status: DC
  Administered 2023-06-05: 40 mg via SUBCUTANEOUS
  Filled 2023-06-05: qty 0.4

## 2023-06-05 MED ORDER — ONDANSETRON HCL 4 MG PO TABS: 4.0000 mg | ORAL_TABLET | Freq: Four times a day (QID) | ORAL | Status: DC | PRN

## 2023-06-05 MED ORDER — INSULIN ASPART 100 UNIT/ML IJ SOLN: 0.0000 [IU] | Freq: Every day | INTRAMUSCULAR | Status: DC

## 2023-06-05 MED ORDER — TRAZODONE HCL 50 MG PO TABS
75.0000 mg | ORAL_TABLET | Freq: Every day | ORAL | Status: DC
  Administered 2023-06-05: 75 mg via ORAL
  Filled 2023-06-05: qty 2

## 2023-06-05 MED ORDER — NYSTATIN 100000 UNIT/GM EX POWD
1.0000 | Freq: Every day | CUTANEOUS | Status: DC
  Administered 2023-06-05 – 2023-06-06 (×2): 1 via TOPICAL
  Filled 2023-06-05: qty 15

## 2023-06-05 MED ORDER — PANTOPRAZOLE SODIUM 40 MG PO TBEC
40.0000 mg | DELAYED_RELEASE_TABLET | Freq: Every day | ORAL | Status: DC
  Administered 2023-06-06: 40 mg via ORAL
  Filled 2023-06-05: qty 1

## 2023-06-05 MED ORDER — LORAZEPAM 0.5 MG PO TABS
0.5000 mg | ORAL_TABLET | Freq: Every day | ORAL | Status: DC | PRN
  Administered 2023-06-06: 0.5 mg via ORAL
  Filled 2023-06-05: qty 1

## 2023-06-05 MED ORDER — ONDANSETRON HCL 4 MG/2ML IJ SOLN: 4.0000 mg | Freq: Four times a day (QID) | INTRAMUSCULAR | Status: DC | PRN

## 2023-06-05 MED ORDER — INSULIN GLARGINE-YFGN 100 UNIT/ML ~~LOC~~ SOLN
35.0000 [IU] | Freq: Two times a day (BID) | SUBCUTANEOUS | Status: DC
  Administered 2023-06-05 – 2023-06-06 (×2): 35 [IU] via SUBCUTANEOUS
  Filled 2023-06-05 (×4): qty 0.35

## 2023-06-05 MED ORDER — BACLOFEN 10 MG PO TABS
20.0000 mg | ORAL_TABLET | Freq: Three times a day (TID) | ORAL | Status: DC
  Administered 2023-06-05 – 2023-06-06 (×2): 20 mg via ORAL
  Filled 2023-06-05 (×2): qty 2

## 2023-06-05 MED ORDER — SODIUM CHLORIDE 0.9 % IV SOLN
1.5000 g | Freq: Three times a day (TID) | INTRAVENOUS | Status: DC
  Filled 2023-06-05 (×3): qty 4

## 2023-06-05 MED ORDER — IOHEXOL 350 MG/ML SOLN
75.0000 mL | Freq: Once | INTRAVENOUS | Status: AC | PRN
  Administered 2023-06-05: 75 mL via INTRAVENOUS

## 2023-06-05 MED ORDER — DULOXETINE HCL 30 MG PO CPEP
30.0000 mg | ORAL_CAPSULE | Freq: Every day | ORAL | Status: DC
  Administered 2023-06-05 – 2023-06-06 (×2): 30 mg via ORAL
  Filled 2023-06-05 (×2): qty 1

## 2023-06-05 MED ORDER — SENNOSIDES-DOCUSATE SODIUM 8.6-50 MG PO TABS
1.0000 | ORAL_TABLET | Freq: Two times a day (BID) | ORAL | Status: DC
  Administered 2023-06-05 – 2023-06-06 (×2): 1 via ORAL
  Filled 2023-06-05 (×2): qty 1

## 2023-06-05 MED ORDER — SODIUM CHLORIDE 0.9 % IV SOLN: 3.0000 g | Freq: Four times a day (QID) | INTRAVENOUS | Status: DC

## 2023-06-05 MED ORDER — INSULIN ASPART 100 UNIT/ML IJ SOLN
0.0000 [IU] | Freq: Three times a day (TID) | INTRAMUSCULAR | Status: DC
  Administered 2023-06-06: 1 [IU] via SUBCUTANEOUS

## 2023-06-05 MED ORDER — SODIUM CHLORIDE 0.9% FLUSH: 3.0000 mL | INTRAVENOUS | Status: DC | PRN

## 2023-06-05 MED ORDER — SODIUM CHLORIDE 0.9 % IV SOLN
1.5000 g | Freq: Three times a day (TID) | INTRAVENOUS | Status: DC
  Filled 2023-06-05 (×2): qty 4

## 2023-06-05 MED ORDER — HYDRALAZINE HCL 20 MG/ML IJ SOLN: 10.0000 mg | Freq: Three times a day (TID) | INTRAMUSCULAR | Status: DC | PRN

## 2023-06-05 MED ORDER — LACTATED RINGERS IV BOLUS
1000.0000 mL | Freq: Once | INTRAVENOUS | Status: AC
  Administered 2023-06-05: 1000 mL via INTRAVENOUS

## 2023-06-05 MED ORDER — ACETAMINOPHEN 325 MG PO TABS
650.0000 mg | ORAL_TABLET | Freq: Four times a day (QID) | ORAL | Status: DC | PRN
  Administered 2023-06-06 (×2): 650 mg via ORAL
  Filled 2023-06-05 (×2): qty 2

## 2023-06-05 MED ORDER — SODIUM CHLORIDE 0.9 % IV SOLN
3.0000 g | Freq: Once | INTRAVENOUS | Status: AC
  Administered 2023-06-05: 3 g via INTRAVENOUS
  Filled 2023-06-05: qty 8

## 2023-06-05 MED ORDER — SODIUM CHLORIDE 0.9 % IV SOLN
1.5000 g | Freq: Four times a day (QID) | INTRAVENOUS | Status: DC
  Administered 2023-06-06 (×2): 1.5 g via INTRAVENOUS
  Filled 2023-06-05 (×5): qty 4

## 2023-06-05 MED ORDER — ACETAMINOPHEN 650 MG RE SUPP: 650.0000 mg | Freq: Four times a day (QID) | RECTAL | Status: DC | PRN

## 2023-06-05 MED ORDER — LACTATED RINGERS IV SOLN: INTRAVENOUS | Status: DC

## 2023-06-05 MED ORDER — SODIUM CHLORIDE 0.9 % IV SOLN: 250.0000 mL | INTRAVENOUS | Status: DC | PRN

## 2023-06-05 MED ORDER — TIZANIDINE HCL 4 MG PO TABS
2.0000 mg | ORAL_TABLET | Freq: Every day | ORAL | Status: DC
  Administered 2023-06-05: 2 mg via ORAL
  Filled 2023-06-05: qty 1

## 2023-06-05 MED ORDER — ATORVASTATIN CALCIUM 40 MG PO TABS
40.0000 mg | ORAL_TABLET | Freq: Every day | ORAL | Status: DC
  Administered 2023-06-05: 40 mg via ORAL
  Filled 2023-06-05: qty 1

## 2023-06-05 MED ORDER — INSULIN GLARGINE 100 UNITS/ML SOLOSTAR PEN
35.0000 [IU] | PEN_INJECTOR | Freq: Two times a day (BID) | SUBCUTANEOUS | Status: DC
  Filled 2023-06-05: qty 3

## 2023-06-05 NOTE — ED Notes (Signed)
Phlebotomy to collect second set of culture and second lactic

## 2023-06-05 NOTE — ED Provider Triage Note (Signed)
 Emergency Medicine Provider Triage Evaluation Note  Brent [name]. , a 77 y.o. male  was evaluated in triage.  Pt complains of postoperative concerns.  Patient reports that he had surgery on his abdomen about 2 weeks ago for wedge resection of stomach and gastrocutaneous fistula in the abdomen.  He reports that he had been sent home and discharged with antibiotics and he has been taking this as prescribed but reports he has been having some increasing redness and swelling from multiple surgical sites.  Does not report any fevers but may have had some chills recently.  Denies any nausea, vomiting, diarrhea.  No chest pain shortness of breath.  History of type 2 diabetes and coronary artery disease.  Review of Systems  Positive: As above Negative: As above  Physical Exam  BP (!) 142/55 (BP Location: Right Arm)   Pulse (!) 58   Temp 98 F (36.7 C) (Oral)   Resp 16   Ht 6' (1.829 m)   Wt 97 kg   SpO2 100%   BMI 29.00 kg/m  Gen:   Awake, no distress   Resp:  Normal effort  MSK:   Moves extremities without difficulty  Other:  Multiple incision sites with what appears to be cellulitic tissue surrounding them in the abdomen.  Some drainage noted which appears to be purulent in nature.  Medical Decision Making  Medically screening exam initiated at 12:24 PM.  Appropriate orders placed.  [name]. was informed that the remainder of the evaluation will be completed by another provider, this initial triage assessment does not replace that evaluation, and the importance of remaining in the ED until their evaluation is complete.     Smitty Knudsen, PA-C 06/06/23 2152

## 2023-06-05 NOTE — H&P (Addendum)
History and Physical    Brent [name]. ZOX:[id number] DOB: [birth year] DOA: 06/05/2023  PCP: [name], MD   Patient coming from: [facility] resident   Chief Complaint:  Chief Complaint  Patient presents with   Abdominal Pain    HPI:  Brent [name]. is a 77 y.o. male with medical history of GI fistula status post wedge resection 9//24, quadriplegia secondary to  transverse myelitis, neuro neurogenic bladder with suprapubic catheter, stage IV sacral decubitus ulcer, insulin-dependent DM type II scented to emergency department sent from nursing home concern for infection of the gastrocutaneous fistula. Chart review patient was was admitted 1 week ago for gastrocutaneous fistula patient underwent laparoscopic removal of the fistula but the wound was not closed.  Per nursing home the wound has been getting worse and redness is getting worse as well.  General surgery Dr. Nobie Putnam performed OSF resection on 9/4.  Recommended postop care twice daily wet-to-dry dressing of the left upper quadrant wound and staple is removed prior to discharge.  Patient completed 7 days course of Augmentin.  During my evaluation patient denies any abdominal pain, fever and chills.  He is reporting increased drainage from the percutaneous fistula site.  Denies any nausea, vomiting, chest pain, and shortness of breath.   ED Course:  At presentation to ED patient is hemodynamically stable.  No evidence of fever.  CMP showed slightly low sodium 134, potassium 4.4, chloride 105, low bicarb 19, blood glucose 233 otherwise unremarkable.  CBC showed leukocytosis 16, RBC 4.04, hemoglobin 11, hematocrit 36, MCV 49, and platelet count 348. Normal lipase level. Elevated lactic acid 2.4 and 2.5. UA increases.  Gravity 1.03, protein positive, leukocyte esterase positive but no bacteria.  No concern for UTI.  Patient is asymptomatic.  CT abdomen pelvis finding following: nterval surgical take down of the  gastric cutaneous fistula. Some ill-defined fluid in the anterior upper abdomen. No clear rim enhancing fluid collection or significant free air. No signs of bowel obstruction.  The focal soft tissue defect along the anterior abdominal wall, 1 on each side in the upper abdomen may be postsurgical as well. Please correlate with clinical findings.  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 Colonic diverticulosis.  Suprapubic catheter.  Again changes of known previous decubitus ulcers in the pelvis.  Stable tiny pleural effusions.    ED physician discussed case with on-call general surgery Dr. Renae Fickle.  Per general surgery CT scan is reassuring.  However given patient has elevated WBC and lactic acid level general surgery recommended to treat with antibiotic and admit for observation.  Review of Systems:  Review of Systems  Constitutional:  Negative for chills, fever, malaise/fatigue and weight loss.  Respiratory:  Negative for cough and shortness of breath.   Cardiovascular:  Negative for chest pain.  Gastrointestinal:  Negative for abdominal pain, constipation, diarrhea, heartburn, nausea and vomiting.  Genitourinary:  Negative for flank pain and hematuria.  Musculoskeletal:  Negative for back pain, joint pain, myalgias and neck pain.  Neurological:  Negative for dizziness and headaches.  Psychiatric/Behavioral:  The patient is not nervous/anxious.     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 angioplasty of his RCA in 1990. b. bare metal stent placed in the RCA in 2000 followed by rotational atherectomy shortly after for stent restenosis. c. last cath was in 2012 showing stable moderate  diffuse CAD. (70% mid LAD, 80% diagonal, 70% Ramus, 40% mid to distal RCA stent restenosis). d. Low risk nuc in 2015.   Diabetes mellitus    Diverticulosis    Elevated CK    Erectile  dysfunction    Hemorrhoids    HTN (hypertension)    Hyperlipidemia    Hypertriglyceridemia    Malignant melanoma of left side of neck (HCC) 10/25/2018   Myocardial infarction (HCC)    Obesity    OSA (obstructive sleep apnea)    Persistent disorder of initiating or maintaining sleep    Personal history of colonic polyps 02/05/2003   Renal lesion 06/20/2016    Past Surgical History:  Procedure Laterality Date   BIOPSY  03/26/2022   Procedure: BIOPSY;  Surgeon: Iva Boop, MD;  Location: [facility] ENDOSCOPY;  Service: Gastroenterology;;   CHOLECYSTECTOMY     CORONARY ANGIOPLASTY     CORONARY STENT PLACEMENT     stenting of the right coronary artery with followup rotational  atherectomy. (3 stents placed)   ENDOSCOPIC MUCOSAL RESECTION  10/13/2022   Procedure: ENDOSCOPIC MUCOSAL RESECTION;  Surgeon: Meridee Score Netty Starring., MD;  Location: [facility] ENDOSCOPY;  Service: Gastroenterology;;   ESOPHAGOGASTRODUODENOSCOPY (EGD) WITH PROPOFOL N/A 03/26/2022   Procedure: ESOPHAGOGASTRODUODENOSCOPY (EGD) WITH PROPOFOL;  Surgeon: Iva Boop, MD;  Location: [facility] ENDOSCOPY;  Service: Gastroenterology;  Laterality: N/A;   ESOPHAGOGASTRODUODENOSCOPY (EGD) WITH PROPOFOL N/A 07/11/2022   Procedure: ESOPHAGOGASTRODUODENOSCOPY (EGD) WITH PROPOFOL;  Surgeon: Meridee Score Netty Starring., MD;  Location: [facility] ENDOSCOPY;  Service: Gastroenterology;  Laterality: N/A;   ESOPHAGOGASTRODUODENOSCOPY (EGD) WITH PROPOFOL N/A 10/13/2022   Procedure: ESOPHAGOGASTRODUODENOSCOPY (EGD) WITH PROPOFOL;  Surgeon: Meridee Score Netty Starring., MD;  Location: [facility] ENDOSCOPY;  Service: Gastroenterology;  Laterality: N/A;   EUS N/A 10/13/2022   Procedure: UPPER ENDOSCOPIC ULTRASOUND (EUS) RADIAL;  Surgeon: Lemar Lofty., MD;  Location: [facility] ENDOSCOPY;  Service: Gastroenterology;  Laterality: N/A;   FINGER SURGERY     right   FOOT SURGERY     right   HEMOSTASIS CLIP PLACEMENT  07/11/2022   Procedure: HEMOSTASIS CLIP PLACEMENT;  Surgeon:  Meridee Score Netty Starring., MD;  Location: [facility] ENDOSCOPY;  Service: Gastroenterology;;  ovesco    HEMOSTASIS CLIP PLACEMENT  10/13/2022   Procedure: HEMOSTASIS CLIP PLACEMENT;  Surgeon: Lemar Lofty., MD;  Location: [facility] ENDOSCOPY;  Service: Gastroenterology;;   HOT HEMOSTASIS N/A 07/11/2022   Procedure: HOT HEMOSTASIS (ARGON PLASMA COAGULATION/BICAP);  Surgeon: Lemar Lofty., MD;  Location: [facility] ENDOSCOPY;  Service: Gastroenterology;  Laterality: N/A;   HOT HEMOSTASIS N/A 10/13/2022   Procedure: HOT HEMOSTASIS (ARGON PLASMA COAGULATION/BICAP);  Surgeon: Lemar Lofty., MD;  Location: [facility] ENDOSCOPY;  Service: Gastroenterology;  Laterality: N/A;   INGUINAL HERNIA REPAIR     right   IR FLUORO PROCEDURE UNLISTED  02/21/2023   IR GASTROSTOMY TUBE REMOVAL  02/10/2022   IR REPLACE G-TUBE SIMPLE WO FLUORO  03/14/2023   IR REPLC GASTRO/COLONIC TUBE PERCUT W/FLUORO  12/22/2022   IR REPLC GASTRO/COLONIC TUBE PERCUT W/FLUORO  01/26/2023   IR REPLC GASTRO/COLONIC TUBE PERCUT W/FLUORO  03/06/2023   IR REPLC GASTRO/COLONIC TUBE PERCUT W/FLUORO  04/27/2023   IRRIGATION AND DEBRIDEMENT ABSCESS N/A 06/04/2020   Procedure: IRRIGATION AND DEBRIDEMENT SACRAL WOUND;  Surgeon: Diamantina Monks, MD;  Location: [facility] OR;  Service: General;  Laterality: N/A;   LAPAROSCOPY N/A 06/04/2020   Procedure: LAPAROSCOPY ASSISTED COLOSTOMY, LAPAROSCOPY GASTROSTOMY;  Surgeon: Diamantina Monks, MD;  Location: [facility] OR;  Service: General;  Laterality: N/A;   LAPAROSCOPY N/A 05/24/2023  Procedure: LAPAROSCOPY DIAGNOSTIC;  Surgeon: Emelia Loron, MD;  Location: [facility] OR;  Service: General;  Laterality: N/A;   LAPAROTOMY N/A 05/24/2023   Procedure: WEDGED RESECTION OF STOMACH AND GASTROCUTANEOUS FISTULA;  Surgeon: Emelia Loron, MD;  Location: [facility] OR;  Service: General;  Laterality: N/A;   LYSIS OF ADHESION N/A 06/04/2020   Procedure: LYSIS OF ADHESION;  Surgeon: Diamantina Monks, MD;  Location: [facility] OR;  Service: General;   Laterality: N/A;   LYSIS OF ADHESION N/A 05/24/2023   Procedure: LYSIS OF ADHESION;  Surgeon: Emelia Loron, MD;  Location: [facility] OR;  Service: General;  Laterality: N/A;   melanoma removal     neck   ORTHOPEDIC SURGERY     foot right   POLYPECTOMY  10/13/2022   Procedure: POLYPECTOMY;  Surgeon: Lemar Lofty., MD;  Location: [facility] ENDOSCOPY;  Service: Gastroenterology;;   rotator cuff surg     Bil   STENT REMOVAL  10/13/2022   Procedure: CLIP REMOVAL;  Surgeon: Lemar Lofty., MD;  Location: [facility] ENDOSCOPY;  Service: Gastroenterology;;   SUBMUCOSAL LIFTING INJECTION  10/13/2022   Procedure: SUBMUCOSAL LIFTING INJECTION;  Surgeon: Lemar Lofty., MD;  Location: [facility] ENDOSCOPY;  Service: Gastroenterology;;   SUBMUCOSAL TATTOO INJECTION  10/13/2022   Procedure: SUBMUCOSAL TATTOO INJECTION;  Surgeon: Lemar Lofty., MD;  Location: [facility] ENDOSCOPY;  Service: Gastroenterology;;     reports that he quit smoking about 42 years ago. His smoking use included cigarettes. He started smoking about 72 years ago. He has a 45 pack-year smoking history. He has never used smokeless tobacco. He reports that he does not currently use alcohol after a past usage of about 7.0 - 14.0 standard drinks of alcohol per week. He reports that he does not use drugs.  Allergies  Allergen Reactions   Cipro [Ciprofloxacin Hcl] Other (See Comments)    Hallucinations   Neurontin [Gabapentin] Other (See Comments)    Unknown reaction   Levaquin [Levofloxacin] Rash   Penicillins Hives and Rash    Tolerated Unasyn, Augmentin, cefepime and cephalexin    Family History  Problem Relation Age of Onset   Lung cancer Mother    COPD Mother    Obesity Mother    Liver cancer Father    Lung cancer Father    Heart disease Father    Heart disease Sister    Colon cancer Neg Hx    Stomach cancer Neg Hx    Pancreatic cancer Neg Hx    Esophageal cancer Neg Hx    Inflammatory bowel disease Neg Hx    Liver  disease Neg Hx    Rectal cancer Neg Hx     Prior to Admission medications   Medication Sig Start Date End Date Taking? Authorizing Provider  acetaminophen (TYLENOL) 500 MG tablet Take 2 tablets (1,000 mg total) by mouth every 6 (six) hours as needed for mild pain (or Fever >/= 101). 05/31/23  Yes Rhetta Mura, MD  atenolol (TENORMIN) 25 MG tablet Take 12.5-25 mg by mouth See admin instructions. Take 12.5 mg (1/2 tablet) by mouth once daily in the morning and 25 mg (1 tablet) at bedtime.   Yes [provider]  atorvastatin (LIPITOR) 40 MG tablet Take 1 tablet (40 mg total) by mouth at bedtime. 05/31/23  Yes Rhetta Mura, MD  baclofen (LIORESAL) 20 MG tablet Take 20 mg by mouth 3 (three) times daily.   Yes [provider]  cholecalciferol (VITAMIN D3) 25 MCG (1000 UNIT) tablet Take 1,000 Units by  mouth daily.   Yes [provider]  DULoxetine (CYMBALTA) 30 MG capsule Take 1 capsule (30 mg total) by mouth at bedtime. 05/31/23  Yes Rhetta Mura, MD  famotidine (PEPCID) 20 MG tablet Take 20 mg by mouth 2 (two) times daily.   Yes [provider]  fluticasone (FLONASE) 50 MCG/ACT nasal spray Place 2 sprays into both nostrils daily. 10/01/08  Yes [provider]  insulin aspart (NOVOLOG) 100 UNIT/ML injection Inject 0-15 Units into the skin every 4 (four) hours. Sliding scale  CBG 70 - 120: 0 units: CBG 121 - 150: 2 units; CBG 151 - 200: 3 units; CBG 201 - 250: 5 units; CBG 251 - 300: 8 units;CBG 301 - 350: 11 units; CBG 351 - 400: 15 units; CBG > 400 : 15 units and notify MD Patient taking differently: Inject 0-15 Units into the skin every 4 (four) hours. Inject 0-15 units every 4 hours per sliding scale: 70-120 : 0 units 121-150 : 2 units 151-200 : 3 units 201-250 : 5 units 251-300 : 8 units 301-350 : 11 units 351-400 : 15 units > 400 : 15 units, notify MD 06/10/20  Yes Rai, Ripudeep K, MD  insulin glargine (LANTUS) 100 unit/mL SOPN  Inject 35 Units into the skin 2 (two) times daily. 05/31/23  Yes Rhetta Mura, MD  lamoTRIgine (LAMICTAL) 25 MG tablet Take 2 tablets (50 mg total) by mouth 2 (two) times daily. For 5 days. Started 02/18/23. 05/31/23  Yes Rhetta Mura, MD  LORazepam (ATIVAN) 0.5 MG tablet Take 0.5 mg by mouth daily as needed (panic attacks).   Yes [provider]  losartan (COZAAR) 50 MG tablet Take 75 mg by mouth daily.   Yes [provider]  Menthol, Topical Analgesic, (BIOFREEZE) 4 % GEL Apply 1 application  topically 2 (two) times daily as needed (pain to back (upper and lower),arms and shoulders).   Yes [provider]  metFORMIN (GLUCOPHAGE) 500 MG tablet Take 1,000 mg by mouth 2 (two) times daily with a meal.   Yes [provider]  nystatin powder Apply 1 Application topically daily. Apply to peri-wound bed, sacrum and right ischium daily with routine dressing.   Yes [provider]  omeprazole (PRILOSEC) 40 MG capsule Take 1 capsule (40 mg total) by mouth 2 (two) times daily. 30 minutes before breakfast/dinner Patient taking differently: Take 40 mg by mouth daily. 10/13/22  Yes Mansouraty, Netty Starring., MD  ondansetron (ZOFRAN-ODT) 4 MG disintegrating tablet Take 4 mg by mouth every 8 (eight) hours as needed for nausea or vomiting.   Yes [provider]  oxyCODONE (OXY IR/ROXICODONE) 5 MG immediate release tablet Take 5 mg by mouth 2 (two) times daily as needed for breakthrough pain.   Yes [provider]  Polyethyl Glycol-Propyl Glycol (GENTEAL TEARS SEVERE DAY/NIGHT) 0.4-0.3 % GEL ophthalmic gel Place 1 Application into both eyes 4 (four) times daily as needed (dry eyes).   Yes [provider]  senna-docusate (SENOKOT-S) 8.6-50 MG tablet Take 1 tablet by mouth 2 (two) times daily.   Yes [provider]  spironolactone (ALDACTONE) 25 MG tablet Take 25 mg by mouth daily.   Yes [provider]  tiZANidine  (ZANAFLEX) 2 MG tablet Take 2 mg by mouth at bedtime. 04/10/23  Yes [provider]  traZODone (DESYREL) 50 MG tablet Take 1.5 tablets (75 mg total) by mouth at bedtime. 05/31/23  Yes Rhetta Mura, MD  vitamin C (ASCORBIC ACID) 500 MG tablet Take 500  mg by mouth daily.   Yes [provider]  leptospermum manuka honey (MEDIHONEY) PSTE paste Apply 1 Application topically daily. Clean R plantar foot wound with NS, apply Medihoney to wound bed daily, cover with dry gauze and secure with Kerlix roll gauze.  Apply thin layer (3 mm) to wound. Patient not taking: Reported on 06/05/2023 05/31/23   Rhetta Mura, MD  Nystatin (GERHARDT'S BUTT CREAM) CREA Apply 1 Application topically 3 (three) times daily. Patient not taking: Reported on 06/05/2023 05/31/23   Rhetta Mura, MD  traMADol (ULTRAM) 50 MG tablet Take 1 tablet (50 mg total) by mouth every 12 (twelve) hours as needed for moderate pain or severe pain (Pain). Patient not taking: Reported on 06/05/2023 05/31/23   Rhetta Mura, MD     Physical Exam: Vitals:   06/05/23 1133 06/05/23 1604 06/05/23 1619 06/05/23 1701  BP:  (!) 135/42    Pulse:  65    Resp:  18    Temp:   99 F (37.2 C) 98.3 F (36.8 C)  TempSrc:   Oral Rectal  SpO2:  99%    Weight: 97 kg     Height: 6' (1.829 m)       Physical Exam Constitutional:      General: He is not in acute distress.    Appearance: He is obese. He is not ill-appearing.  Cardiovascular:     Rate and Rhythm: Normal rate and regular rhythm.     Heart sounds: No murmur heard. Pulmonary:     Effort: Pulmonary effort is normal.     Breath sounds: Normal breath sounds.  Abdominal:     General: Abdomen is flat. Bowel sounds are normal. There is no distension.     Palpations: Abdomen is soft.     Tenderness: There is no abdominal tenderness.     Hernia: No hernia is present.     Comments: There are 3 external surgical dressing.  2 of them has been soaked with  blood tinged fluid.  Skin:    Capillary Refill: Capillary refill takes less than 2 seconds.     Findings: No erythema or rash.  Neurological:     Mental Status: He is oriented to person, place, and time.  Psychiatric:        Mood and Affect: Mood normal. Mood is not anxious.        Behavior: Behavior normal.      Labs on Admission: I have personally reviewed following labs and imaging studies  CBC: Recent Labs  Lab 05/30/23 0311 05/31/23 0252 06/05/23 1226  WBC 9.6 10.7* 16.0*  NEUTROABS  --   --  12.7*  HGB 9.4* 9.6* 11.0*  HCT 30.9* 31.2* 36.3*  MCV 86.3 86.2 89.9  PLT 343 321 384   Basic Metabolic Panel: Recent Labs  Lab 05/31/23 0252 06/05/23 1226  NA 132* 134*  K 3.9 4.4  CL 101 105  CO2 24 19*  GLUCOSE 290* 233*  BUN 14 20  CREATININE 0.61 0.74  CALCIUM 8.1* 8.5*   GFR: Estimated Creatinine Clearance: 91.9 mL/min (by C-G formula based on SCr of 0.74 mg/dL). Liver Function Tests: Recent Labs  Lab 05/31/23 0252 06/05/23 1226  AST 21 25  ALT 21 25  ALKPHOS 90 103  BILITOT 0.3 0.4  PROT 5.4* 6.3*  ALBUMIN 2.3* 2.8*   Recent Labs  Lab 06/05/23 1226  LIPASE 36   No results for input(s): "AMMONIA" in the last 168 hours. Coagulation Profile: No results for input(s): "  INR", "PROTIME" in the last 168 hours. Cardiac Enzymes: No results for input(s): "CKTOTAL", "CKMB", "CKMBINDEX", "TROPONINI", "TROPONINIHS" in the last 168 hours. BNP (last 3 results) Recent Labs    02/22/23 0636 02/23/23 0717  BNP 91.3 77.0   HbA1C: No results for input(s): "HGBA1C" in the last 72 hours. CBG: Recent Labs  Lab 05/31/23 0359 05/31/23 0731 05/31/23 1130 05/31/23 1702 05/31/23 2023  GLUCAP 231* 202* 173* 231* 270*   Lipid Profile: No results for input(s): "CHOL", "HDL", "LDLCALC", "TRIG", "CHOLHDL", "LDLDIRECT" in the last 72 hours. Thyroid Function Tests: No results for input(s): "TSH", "T4TOTAL", "FREET4", "T3FREE", "THYROIDAB" in the last 72  hours. Anemia Panel: No results for input(s): "VITAMINB12", "FOLATE", "FERRITIN", "TIBC", "IRON", "RETICCTPCT" in the last 72 hours. Urine analysis:    Component Value Date/Time   COLORURINE YELLOW 06/05/2023 1843   APPEARANCEUR CLEAR 06/05/2023 1843   LABSPEC 1.032 (H) 06/05/2023 1843   PHURINE 5.0 06/05/2023 1843   GLUCOSEU 50 (A) 06/05/2023 1843   HGBUR NEGATIVE 06/05/2023 1843   BILIRUBINUR NEGATIVE 06/05/2023 1843   BILIRUBINUR n 03/18/2015 0928   KETONESUR NEGATIVE 06/05/2023 1843   PROTEINUR 30 (A) 06/05/2023 1843   UROBILINOGEN 2.0 03/18/2015 0928   UROBILINOGEN 1.0 07/16/2012 1345   NITRITE NEGATIVE 06/05/2023 1843   LEUKOCYTESUR SMALL (A) 06/05/2023 1843    Radiological Exams on Admission: I have personally reviewed images CT ABDOMEN PELVIS W CONTRAST  Result Date: 06/05/2023 CLINICAL DATA:  Nonlocalized abdominal pain. Wound infection. History of gastric cutaneous fistula. EXAM: CT ABDOMEN AND PELVIS WITH CONTRAST TECHNIQUE: Multidetector CT imaging of the abdomen and pelvis was performed using the standard protocol following bolus administration of intravenous contrast. RADIATION DOSE REDUCTION: This exam was performed according to the departmental dose-optimization program which includes automated exposure control, adjustment of the mA and/or kV according to patient size and/or use of iterative reconstruction technique. CONTRAST:  75mL OMNIPAQUE IOHEXOL 350 MG/ML SOLN COMPARISON:  CT 05/21/2023 FINDINGS: Lower chest: Stable tiny pleural effusions. Trace pericardial fluid. Coronary artery calcifications are seen. Gynecomastia. There is some linear opacity seen at the bases likely scar or atelectasis. Breathing motion. Hepatobiliary: Diffuse fatty liver infiltration. No space-occupying liver lesion. Previous cholecystectomy. Patent portal vein. Pancreas: Unremarkable. No pancreatic ductal dilatation or surrounding inflammatory changes. Spleen: Normal in size without focal  abnormality. Adrenals/Urinary Tract: Adrenal glands are preserved. Once again there are some benign bilateral renal cystic foci. Largest on the left medially measures diameter of up to 3.5 cm. Hounsfield unit of 0. No imaging follow-up. There is also a rounded dense calcified structure along the upper aspect of the left kidney which is stable and again likely a benign lesion. Previously this has felt to represent retained contrast with some parenchymal atrophy. Please correlate with the history. The ureters have normal course and caliber extending down to the urinary bladder. Suprapubic catheter in the decompressed urinary bladder. Similar to previous. Stomach/Bowel: On this non oral contrast exam, once again there is sigmoid colon stool moderate stool with diverticula. Once again there is a left lower quadrant ostomy for the colon. Both diverting ostomy and mucous fistula in the ostomy site. More proximally the large bowel is nondilated. Scattered stool. Normal appendix in the right lower quadrant posterior to the cecum. Moderate debris in the stomach. Surgical changes along the anterior gastric wall at the antrum is new from previous. The previous fistula to the stomach is no longer seen. Please correlate for interval surgical repair. The small bowel is nondilated. Vascular/Lymphatic: Aortic atherosclerosis. No  enlarged abdominal or pelvic lymph nodes. Reproductive: Prostate is unremarkable. Other: Anasarca. Presumed surgical changes along the upper abdomen with some scattered ill-defined fluid anterior to the stomach. No clear rim enhancing fluid collections or significant extraluminal gas. There are 2 focal anterior abdominal wall subcutaneous fat and skin defects identified 1 in the right upper quadrant on axial series 3, image 38. The other anterior to the antrum of the stomach. There is also some air and fluid in the area of the umbilicus. Musculoskeletal: Once again osteopenia with the advanced degenerative  changes along the spine and pelvis. Multilevel stenosis along the spine. Bridging osteophytes identified. Soft tissue abnormality of the right ischial region with extension down to bone with sclerosis. Unchanged from previous. Please correlate for a chronic decubitus ulcer. There also truncation of the lower sacrum and coccyx which is stable. 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 Some ill-defined fluid in the anterior upper abdomen. No clear rim enhancing fluid collection or significant free air. No signs of bowel obstruction. The focal soft tissue defect along the anterior abdominal wall, 1 on each side in the upper abdomen may be postsurgical as well. Please correlate with clinical findings. 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 Colonic diverticulosis. Suprapubic catheter. Again changes of known previous decubitus ulcers in the pelvis. Stable tiny pleural effusions. Electronically Signed   By: Karen Kays M.D.   On: 06/05/2023 16:49    EKG: My personal interpretation of EKG shows: Pending EKG    Assessment/Plan: Principal Problem:   SIRS (systemic inflammatory response syndrome) (HCC) Active Problems:   External gastrointestinal fistula site infection   Insulin dependent type 2 diabetes mellitus (HCC)   Essential hypertension   Quadriplegia (HCC)   Neurogenic bladder   Stage IV pressure ulcer of sacral region [facility])   Suprapubic catheter (HCC)    Assessment and Plan: SIRS Concern for gastrocutaneous fistula site infection Leakage from gastrointestinal fistula Gastrointestinal fistula status post wedge resection on 05/24/2023 -Patient was referred from nursing home as found to have increased serosanguineous discharge from gastrocutaneous fistula site. -Patient was admitted 2 weeks ago for gastrointestinal fistula site infection and underwent wedge resection of the fistulous track on 9/4 by Dr. Dwain Sarna. -On presentation patient is  afebrile however he has leukocytosis and elevated lactic acid with a source of infection.  Patient meets SIRS criteria - CT abdomen pelvis unremarkable finding. -General Surgery has been consulted Dr. Renae Fickle review CT scan of the abdomen mentioned reassuring finding however recommended to admit patient to treat with antibiotic. - Obtaining blood cultures. - Due to elevated lactic acid treating with 1 L of LR bolus and continue LR 100 cc/h for next 1 day. - Patient has been treated with Unasyn on last admission.  In the ED received 1 dose of Unasyn. - Continue Unasyn 1.5 g 3 times daily for neck 7 days.  Will follow-up with blood cultures result for further antibiotic guidance and also will monitor improvement of gastroenteritis/subcutaneous fistula site drainage. - Consulted inpatient wound care for dressing management of fistula site. -Continue monitor fever curve and CBC. -Continue Tylenol as needed.  Quadriplegia secondary to transverse myelitis -Continue home medications include baclofen 20 mg 3 times daily, lamotrigine 50 mg twice daily, Zanaflex 2 mg at bedtime. -Continue Roxicodone 2 mg twice daily as needed.  Essential hypertension At home patient is on atenolol 12.5 mg in the morning and 25 mg at the nighttime, spironolactone 25 mg daily and losartan 75 mg daily. -  Patient blood pressure is within normal range. - In the setting of SIRS and early sepsis holding oral blood pressure medication to prevent any hypotension - Continue hydralazine as needed  Neurogenic bladder with suprapubic catheter -Patient's UA showed increased specific gravity indicates dehydration, protein 30 and small leukocyte esterase.  No bacteria.  Patient denies any urinary symptoms and bladder spasm. - Continue to monitor urine output  Stage IV sacral ulcer -Continue dressing change. - Consulted inpatient wound care for further evaluation  Insulin-dependent DM type II -Per chart review previous A1c 12 in  2012 - Checking A1c level -Holding metformin in the setting of lactic acidosis - Continue Semglee 35 unit twice daily - Continue POC blood glucose check, SSI and at bedtime insulin as needed.  General Anxiety disorder - Continue Cymbalta 30 mg daily and Ativan 0.5 mg as needed. -Continue trazodone 75 mg at bedtime  Hyperlipidemia - Continue Lipitor  Mild hyponatremia - Slightly low sodium 134.  Which is around patient baseline.  Continue to monitor.  Metabolic acidosis - Slightly low bicarb 19.  Likely secondary from lactic acidosis. Seen to monitor bicarb level   DVT prophylaxis:  Lovenox Code Status:  Full Code.  Verified with patient Diet: Heart healthy and carb modified diet Family Communication: No family member at bedside Disposition Plan: Follow-up with blood culture and improvement of serosanguineous drainage of fistula site.  Tentative discharge to nursing home next 2 days Consults: General Surgery Admission status:   Observation, Med-Surg  Severity of Illness: The appropriate patient status for this patient is OBSERVATION. Observation status is judged to be reasonable and necessary in order to provide the required intensity of service to ensure the patient's safety. The patient's presenting symptoms, physical exam findings, and initial radiographic and laboratory data in the context of their medical condition is felt to place them at decreased risk for further clinical deterioration. Furthermore, it is anticipated that the patient will be medically stable for discharge from the hospital within 2 midnights of admission.     Tereasa Coop, MD [facility]  How to contact the [facility] Attending or Consulting provider 7A - 7P or covering provider during after hours 7P -7A, for this patient.  Check the care team in [facility] and look for a) attending/consulting [facility] provider listed and b) the [facility] team listed Log into [email or web address] and use [facility] universal password to  access. If you do not have the password, please contact the hospital operator. Locate the [facility] provider you are looking for under [facility] and page to a number that you can be directly reached. If you still have difficulty reaching the provider, please page the [facility] (Director on Call) for the Hospitalists listed on amion for assistance.  06/05/2023, 9:16 PM

## 2023-06-05 NOTE — ED Triage Notes (Signed)
Pt to the ED from [facility] and rehab with CC of possible infection around surgical site. Pt had G tube replaced on a recent hospital admission. MD at rehab facility is concerned about leaking and redness round site. Pt denies pain to area, n/v/d, fever/ chills.   Pt was Dced from the hospital on the 11th.

## 2023-06-05 NOTE — ED Provider Notes (Signed)
  Physical Exam  BP (!) 135/42 (BP Location: Left Arm)   Pulse 65   Temp 99 F (37.2 C) (Oral)   Resp 18   Ht 6' (1.829 m)   Wt 97 kg   SpO2 99%   BMI 29.00 kg/m   Physical Exam  Procedures  Procedures  ED Course / MDM    Medical Decision Making Amount and/or Complexity of Data Reviewed Radiology: ordered.  Risk Prescription drug management.   Quadriplegic patient with recent admission comes in from snf due to concerns for infection. He is on rituxan. He also has iddm.  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 The wound has not closed. Per nursing home, the wound is worse and drainage is worse.   He has pressure ulcers and chronic indwelling foley catheter.  Patient indicates that he finished antibiotics yesterday.  The nurse at the facility indicated that the drainage today was more and was yellowish and the wound itself looks larger.  CT pending. Will need Surgery consult.   7:50 PM Discussed case with Dr. Ollen Gross. -General Surgery. Discussed with him the presenting symptoms, concerns from nursing home of worsening drainage and wound site, elevated white count.  He reviewed the CT scan, and recommends that perhaps we admit the patient overnight -surgery team will see him.  CT scan is overall not concerning.     Derwood Kaplan, MD 06/05/23 [phone]

## 2023-06-05 NOTE — ED Notes (Signed)
Per day shift RN second set of blood cultures unable to be obtained.

## 2023-06-05 NOTE — ED Notes (Signed)
Awaiting patient from lobby

## 2023-06-05 NOTE — ED Notes (Signed)
Pt was stuck twice. Was unsuccessful.

## 2023-06-05 NOTE — ED Provider Notes (Signed)
[place] EMERGENCY DEPARTMENT AT [facility] Provider Note   CSN: [id number] Arrival date & time: 06/05/23  1122     History  Chief Complaint  Patient presents with   Abdominal Pain    Brent [name]. is a 77 y.o. male.  Patient currently a resident at [facility] and rehab.  Sent in with concerns for possible wound infection where his G-tube site was.  The rehab facility doctor is concerned about leaking and redness around the site.  Both wounds are open in the upper abdomen 1 in the right upper quadrant and the other in the left upper quadrant.  There is erythema around the 1 in the left upper quadrant.  And it is open and does have some packing in it.  Patient was discharged from the hospital on September 11.  Patient states that the G-tube was permanently removed on September 1.  Patient had the G-tube due to a neurological condition.  Past medical history significant for coronary artery disease hypertension hyperlipidemia obesity's sleep apnea malignant melanoma left-sided neck hypertriglyceridemia benign neuroendocrine tumor of the stomach in 2023.  Past surgical history is significant for coronary stent placement 3 stents placed gallbladder removal right inguinal hernia repair irrigation debridement of sacral wound abscess in 2021.  Lysis of adhesions in 2021.  IR gastrostomy tube placed in May 2023.  Patient in September 2020 for underwent laparoscopic diagnostic.  Wedge resection of stomach and gastrocutaneous fistula done by Dr. Dwain Sarna.  Lysis of adhesions done at that time 2.  Patient is a former smoker quit in 1982.  Patient was admitted September 1 G-tube displacement and acute metabolic encephalopathy.  Patient also seems to have chronic wounds to the right leg that is wrapped.  Patient's discharge summary.  Patient was in the hospital from September 1 to September 11.  Has follow-up with Dr. Dwain Sarna September 23.  Discharge diagnosis from this was leaking GC  fistula status post surgery this hospitalization abscess in the right upper quadrant completing antibiotics as outpatient quadriplegia requiring suprapubic Foley stage IV decubitus ulcer.  Right lower extremity wound.  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 Sherol Dade chronically.  Last seen by him on April 25, 2023.  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  Suprapubic catheter in place previously complicated by UTIs.  Prior gastrostomy placed is in June 04, 2020 status post attempted closure February 21, 2023 by general surgery multiple Tylenol times to close fistula enlarged and increased drainage came to emergency department September 1 for workup abnormal lives.  September 4 had wedge resection of the stomach gastrocutaneous fistula with lysis of adhesions by week feel patient was transiently on TPN which was stopped in because of purulence noted in right upper quadrant port site with blanching erythema antibiotics were started.  Hospital course patient treated with Augmentin for right upper quadrant wound abscess complete 7 days treatment.       Home Medications Prior to Admission medications   Medication Sig Start Date End Date Taking? Authorizing Provider  acetaminophen (TYLENOL) 500 MG tablet Take 2 tablets (1,000 mg total) by mouth every 6 (six) hours as needed for mild pain (or Fever >/= 101). 05/31/23  Yes Rhetta Mura, MD  atenolol (TENORMIN) 25 MG tablet Take 12.5-25 mg by mouth See admin instructions. Take 12.5 mg (1/2 tablet) by mouth once daily in the morning and 25 mg (1 tablet) at bedtime.    [provider]  atorvastatin (LIPITOR) 40  MG tablet Take 1 tablet (40 mg total) by mouth at bedtime. 05/31/23   Rhetta Mura, MD  baclofen (LIORESAL) 20 MG tablet Take 20 mg by mouth 3 (three) times daily.    [provider]  cholecalciferol (VITAMIN D3) 25 MCG (1000 UNIT) tablet Take 1,000 Units by mouth  daily.    [provider]  DULoxetine (CYMBALTA) 30 MG capsule Take 1 capsule (30 mg total) by mouth at bedtime. 05/31/23   Rhetta Mura, MD  famotidine (PEPCID) 20 MG tablet Take 20 mg by mouth 2 (two) times daily.    [provider]  fluticasone (FLONASE) 50 MCG/ACT nasal spray Place 2 sprays into both nostrils daily. 10/01/08   [provider]  insulin aspart (NOVOLOG) 100 UNIT/ML injection Inject 0-15 Units into the skin every 4 (four) hours. Sliding scale  CBG 70 - 120: 0 units: CBG 121 - 150: 2 units; CBG 151 - 200: 3 units; CBG 201 - 250: 5 units; CBG 251 - 300: 8 units;CBG 301 - 350: 11 units; CBG 351 - 400: 15 units; CBG > 400 : 15 units and notify MD Patient taking differently: Inject 26 Units into the skin See admin instructions. Inject 26 units into the skin before meals. Do not give if CBG <183ml/dl. 06/10/20   Rai, Ripudeep K, MD  insulin glargine (LANTUS) 100 unit/mL SOPN Inject 35 Units into the skin 2 (two) times daily. 05/31/23   Rhetta Mura, MD  lamoTRIgine (LAMICTAL) 25 MG tablet Take 2 tablets (50 mg total) by mouth 2 (two) times daily. For 5 days. Started 02/18/23. 05/31/23   Rhetta Mura, MD  leptospermum manuka honey (MEDIHONEY) PSTE paste Apply 1 Application topically daily. Clean R plantar foot wound with NS, apply Medihoney to wound bed daily, cover with dry gauze and secure with Kerlix roll gauze.  Apply thin layer (3 mm) to wound. 05/31/23   Rhetta Mura, MD  losartan (COZAAR) 50 MG tablet Take 75 mg by mouth daily.    [provider]  Menthol, Topical Analgesic, (BIOFREEZE) 4 % GEL Apply 1 application  topically 2 (two) times daily as needed (Pain to back upper and lower arms and shoulders).    [provider]  metFORMIN (GLUCOPHAGE) 500 MG tablet Take 1,000 mg by mouth 2 (two) times daily with a meal.    [provider]  Nystatin (GERHARDT'S BUTT CREAM) CREA Apply 1 Application topically 3  (three) times daily. 05/31/23   Rhetta Mura, MD  nystatin powder Apply 1 Application topically daily. Apply to peri-wound bed to sacrum and right ischium daily with routine dressing.    [provider]  omeprazole (PRILOSEC) 40 MG capsule Take 1 capsule (40 mg total) by mouth 2 (two) times daily. 30 minutes before breakfast/dinner Patient taking differently: Take 40 mg by mouth 2 (two) times daily. 10/13/22   Mansouraty, Netty Starring., MD  ondansetron (ZOFRAN) 4 MG tablet Take 4 mg by mouth every 8 (eight) hours as needed for nausea.    [provider]  Polyethyl Glycol-Propyl Glycol (GENTEAL TEARS SEVERE DAY/NIGHT) 0.4-0.3 % GEL ophthalmic gel Place 1 Application into both eyes 4 (four) times daily as needed (Dry Eyes).    [provider]  senna-docusate (SENOKOT-S) 8.6-50 MG tablet Take 1 tablet by mouth 2 (two) times daily.    [provider]  spironolactone (ALDACTONE) 25 MG tablet Take 25 mg by mouth daily.    [provider]  tiZANidine (ZANAFLEX) 2 MG tablet Take 2 mg by  mouth at bedtime. 04/10/23   [provider]  traMADol (ULTRAM) 50 MG tablet Take 1 tablet (50 mg total) by mouth every 12 (twelve) hours as needed for moderate pain or severe pain (Pain). 05/31/23   Rhetta Mura, MD  traZODone (DESYREL) 50 MG tablet Take 1.5 tablets (75 mg total) by mouth at bedtime. 05/31/23   Rhetta Mura, MD  vitamin C (ASCORBIC ACID) 500 MG tablet Take 500 mg by mouth daily.    [provider]      Allergies    Ciprofloxacin, Gabapentin, Levofloxacin, and Penicillins    Review of Systems   Review of Systems  Constitutional:  Negative for chills and fever.  HENT:  Negative for ear pain and sore throat.   Eyes:  Negative for pain and visual disturbance.  Respiratory:  Negative for cough and shortness of breath.   Cardiovascular:  Negative for chest pain and palpitations.  Gastrointestinal:  Positive for nausea. Negative  for abdominal pain and vomiting.  Genitourinary:  Negative for dysuria and hematuria.  Musculoskeletal:  Negative for arthralgias and back pain.  Skin:  Positive for wound. Negative for color change and rash.  Neurological:  Negative for seizures and syncope.  All other systems reviewed and are negative.   Physical Exam Updated Vital Signs BP (!) 142/55 (BP Location: Right Arm)   Pulse (!) 58   Temp 98 F (36.7 C) (Oral)   Resp 16   Ht 1.829 m (6')   Wt 97 kg   SpO2 100%   BMI 29.00 kg/m  Physical Exam Vitals and nursing note reviewed.  Constitutional:      General: He is not in acute distress.    Appearance: He is well-developed. He is obese.  HENT:     Head: Normocephalic and atraumatic.     Mouth/Throat:     Mouth: Mucous membranes are moist.  Eyes:     Conjunctiva/sclera: Conjunctivae normal.  Cardiovascular:     Rate and Rhythm: Normal rate and regular rhythm.     Heart sounds: No murmur heard. Pulmonary:     Effort: Pulmonary effort is normal. No respiratory distress.     Breath sounds: Normal breath sounds.  Abdominal:     Palpations: Abdomen is soft.     Tenderness: There is no abdominal tenderness.     Comments: Open wounds in the right upper quadrant left upper quadrant.  Left upper quadrant wound has some erythema surrounding it.  No purulent discharge.  Patient does have a suprapubic catheter in place.  The wound around that seems to be fine.  Both wounds are nontender to palpation.  Musculoskeletal:        General: No swelling.     Cervical back: Neck supple.     Comments: Right leg is wrapped with an Ace wrap.  Patient states he is got some chronic skin wounds there.  Skin:    General: Skin is warm and dry.     Capillary Refill: Capillary refill takes less than 2 seconds.  Neurological:     Mental Status: He is alert. Mental status is at baseline.  Psychiatric:        Mood and Affect: Mood normal.     ED Results / Procedures / Treatments    Labs (all labs ordered are listed, but only abnormal results are displayed) Labs Reviewed  COMPREHENSIVE METABOLIC PANEL  CBC WITH DIFFERENTIAL/PLATELET  URINALYSIS, ROUTINE W REFLEX MICROSCOPIC  LIPASE, BLOOD  LACTIC ACID, PLASMA  LACTIC ACID, PLASMA  EKG None  Radiology No results found.  Procedures Procedures    Medications Ordered in ED Medications - No data to display  ED Course/ Medical Decision Making/ A&P                                 Medical Decision Making  Based on the recent laparotomy and concerns from the rehab facility.  Patient will need CT scan of the abdomen and pelvis.  Patient's lactic acid was elevated at 2.4.  But patient's vital signs are not concerning.  Temps 98 heart rate 58 blood pressure 142/55.  Will go ahead and get blood cultures.  Will see what we get on CBC complete metabolic panel.  And what we get on the CT scan results.   Final Clinical Impression(s) / ED Diagnoses Final diagnoses:  None    Rx / DC Orders ED Discharge Orders     None         Vanetta Mulders, MD 06/05/23 1342

## 2023-06-06 DIAGNOSIS — R6510 Systemic inflammatory response syndrome (SIRS) of non-infectious origin without acute organ dysfunction: Secondary | ICD-10-CM | POA: Diagnosis not present

## 2023-06-06 DIAGNOSIS — K632 Fistula of intestine: Secondary | ICD-10-CM | POA: Diagnosis not present

## 2023-06-06 LAB — COMPREHENSIVE METABOLIC PANEL
ALT: 21 U/L (ref 0–44)
AST: 22 U/L (ref 15–41)
Albumin: 2.4 g/dL — ABNORMAL LOW (ref 3.5–5.0)
Alkaline Phosphatase: 86 U/L (ref 38–126)
Anion gap: 10 (ref 5–15)
BUN: 15 mg/dL (ref 8–23)
CO2: 19 mmol/L — ABNORMAL LOW (ref 22–32)
Calcium: 8.3 mg/dL — ABNORMAL LOW (ref 8.9–10.3)
Chloride: 103 mmol/L (ref 98–111)
Creatinine, Ser: 0.67 mg/dL (ref 0.61–1.24)
GFR, Estimated: >60 mL/min (ref >60)
Glucose, Bld: 180 mg/dL — ABNORMAL HIGH (ref 70–99)
Potassium: 4.1 mmol/L (ref 3.5–5.1)
Sodium: 132 mmol/L — ABNORMAL LOW (ref 135–145)
Total Bilirubin: 0.3 mg/dL (ref 0.3–1.2)
Total Protein: 5.4 g/dL — ABNORMAL LOW (ref 6.5–8.1)

## 2023-06-06 LAB — CBC
HCT: 32 % — ABNORMAL LOW (ref 39.0–52.0)
Hemoglobin: 9.7 g/dL — ABNORMAL LOW (ref 13.0–17.0)
MCH: 26.7 pg (ref 26.0–34.0)
MCHC: 30.3 g/dL (ref 30.0–36.0)
MCV: 88.2 fL (ref 80.0–100.0)
Platelets: 361 10*3/uL (ref 150–400)
RBC: 3.63 MIL/uL — ABNORMAL LOW (ref 4.22–5.81)
RDW: 16.1 % — ABNORMAL HIGH (ref 11.5–15.5)
WBC: 14.5 10*3/uL — ABNORMAL HIGH (ref 4.0–10.5)
nRBC: 0 % (ref 0.0–0.2)

## 2023-06-06 LAB — GLUCOSE, CAPILLARY
Glucose-Capillary: 107 mg/dL — ABNORMAL HIGH (ref 70–99)
Glucose-Capillary: 180 mg/dL — ABNORMAL HIGH (ref 70–99)

## 2023-06-06 LAB — HEMOGLOBIN A1C
Hgb A1c MFr Bld: 7.5 % — ABNORMAL HIGH (ref 4.8–5.6)
Mean Plasma Glucose: 168.55 mg/dL

## 2023-06-06 LAB — LACTIC ACID, PLASMA
Lactic Acid, Venous: 2.4 mmol/L (ref 0.5–1.9)
Lactic Acid, Venous: 1.3 mmol/L (ref 0.5–1.9)
Lactic Acid, Venous: 1.6 mmol/L (ref 0.5–1.9)

## 2023-06-06 MED ORDER — LORAZEPAM 0.5 MG PO TABS: 0.5000 mg | ORAL_TABLET | Freq: Every day | ORAL | 0 refills | Status: DC | PRN

## 2023-06-06 MED ORDER — LACTATED RINGERS IV SOLN: INTRAVENOUS | Status: DC

## 2023-06-06 MED ORDER — IPRATROPIUM-ALBUTEROL 0.5-2.5 (3) MG/3ML IN SOLN: 3.0000 mL | RESPIRATORY_TRACT | Status: DC | PRN

## 2023-06-06 MED ORDER — BACLOFEN 20 MG PO TABS: 20.0000 mg | ORAL_TABLET | Freq: Three times a day (TID) | ORAL | 0 refills | Status: DC

## 2023-06-06 MED ORDER — HYDRALAZINE HCL 20 MG/ML IJ SOLN: 10.0000 mg | INTRAMUSCULAR | Status: DC | PRN

## 2023-06-06 MED ORDER — GUAIFENESIN 100 MG/5ML PO LIQD: 5.0000 mL | ORAL | Status: DC | PRN

## 2023-06-06 MED ORDER — TIZANIDINE HCL 2 MG PO TABS: 2.0000 mg | ORAL_TABLET | Freq: Every day | ORAL | 0 refills | Status: DC

## 2023-06-06 MED ORDER — TRAMADOL HCL 50 MG PO TABS: 50.0000 mg | ORAL_TABLET | Freq: Two times a day (BID) | ORAL | 0 refills | Status: DC | PRN

## 2023-06-06 MED ORDER — METOPROLOL TARTRATE 5 MG/5ML IV SOLN: 5.0000 mg | INTRAVENOUS | Status: DC | PRN

## 2023-06-06 MED ORDER — SENNOSIDES-DOCUSATE SODIUM 8.6-50 MG PO TABS: 1.0000 | ORAL_TABLET | Freq: Every evening | ORAL | Status: DC | PRN

## 2023-06-06 MED ORDER — OXYCODONE HCL 5 MG PO TABS: 5.0000 mg | ORAL_TABLET | Freq: Two times a day (BID) | ORAL | 0 refills | Status: DC | PRN

## 2023-06-06 NOTE — ED Notes (Addendum)
ED TO INPATIENT HANDOFF REPORT  ED Nurse Name and Phone #: Juliette Alcide RN [phone]  S Name/Age/Gender Brent [name] 77 y.o. male Room/Bed: 003C/003C  Code Status   Code Status: Full Code  Home/SNF/Other Nursing Home Patient oriented to: self, place, time, and situation Is this baseline? Yes   Triage Complete: Triage complete  Chief Complaint SIRS (systemic inflammatory response syndrome) (HCC) [R65.10]  Triage Note Pt to the ED from [facility] and rehab with CC of possible infection around surgical site. Pt had G tube replaced on a recent hospital admission. MD at rehab facility is concerned about leaking and redness round site. Pt denies pain to area, n/v/d, fever/ chills.   Pt was Dced from the hospital on the 11th.    Allergies Allergies  Allergen Reactions   Cipro [Ciprofloxacin Hcl] Other (See Comments)    Hallucinations   Neurontin [Gabapentin] Other (See Comments)    Unknown reaction   Levaquin [Levofloxacin] Rash   Penicillins Hives and Rash    Tolerated Unasyn, Augmentin, cefepime and cephalexin    Level of Care/Admitting Diagnosis ED Disposition     ED Disposition  Admit   Condition  --   Comment  Hospital Area: [facility] [[id number]]  Level of Care: Med-Surg [16]  May place patient in observation at [facility] or [facility] if equivalent level of care is available:: No  Covid Evaluation: Asymptomatic - no recent exposure (last 10 days) testing not required  Diagnosis: SIRS (systemic inflammatory response syndrome) [facility]) [[id number]]  Admitting Physician: Tereasa Coop [[id number]]  Attending Physician: Tereasa Coop [[id number]]          B Medical/Surgery History Past Medical History:  Diagnosis Date   Allergy    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 angioplasty of his RCA in 1990. b. bare metal stent placed in the RCA in 2000 followed by rotational atherectomy shortly after for  stent restenosis. c. last cath was in 2012 showing stable moderate diffuse CAD. (70% mid LAD, 80% diagonal, 70% Ramus, 40% mid to distal RCA stent restenosis). d. Low risk nuc in 2015.   Diabetes mellitus    Diverticulosis    Elevated CK    Erectile dysfunction    Hemorrhoids    HTN (hypertension)    Hyperlipidemia    Hypertriglyceridemia    Malignant melanoma of left side of neck (HCC) 10/25/2018   Myocardial infarction (HCC)    Obesity    OSA (obstructive sleep apnea)    Persistent disorder of initiating or maintaining sleep    Personal history of colonic polyps 02/05/2003   Renal lesion 06/20/2016   Past Surgical History:  Procedure Laterality Date   BIOPSY  03/26/2022   Procedure: BIOPSY;  Surgeon: Iva Boop, MD;  Location: [facility] ENDOSCOPY;  Service: Gastroenterology;;   CHOLECYSTECTOMY     CORONARY ANGIOPLASTY     CORONARY STENT PLACEMENT     stenting of the right coronary artery with followup rotational  atherectomy. (3 stents placed)   ENDOSCOPIC MUCOSAL RESECTION  10/13/2022   Procedure: ENDOSCOPIC MUCOSAL RESECTION;  Surgeon: Meridee Score Netty Starring., MD;  Location: [facility] ENDOSCOPY;  Service: Gastroenterology;;   ESOPHAGOGASTRODUODENOSCOPY (EGD) WITH PROPOFOL N/A 03/26/2022   Procedure: ESOPHAGOGASTRODUODENOSCOPY (EGD) WITH PROPOFOL;  Surgeon: Iva Boop, MD;  Location: [facility] ENDOSCOPY;  Service: Gastroenterology;  Laterality: N/A;   ESOPHAGOGASTRODUODENOSCOPY (EGD) WITH PROPOFOL N/A 07/11/2022   Procedure: ESOPHAGOGASTRODUODENOSCOPY (EGD) WITH PROPOFOL;  Surgeon: Corliss Parish  Montez Hageman., MD;  Location: [facility] ENDOSCOPY;  Service: Gastroenterology;  Laterality: N/A;   ESOPHAGOGASTRODUODENOSCOPY (EGD) WITH PROPOFOL N/A 10/13/2022   Procedure: ESOPHAGOGASTRODUODENOSCOPY (EGD) WITH PROPOFOL;  Surgeon: Meridee Score Netty Starring., MD;  Location: [facility] ENDOSCOPY;  Service: Gastroenterology;  Laterality: N/A;   EUS N/A 10/13/2022   Procedure: UPPER ENDOSCOPIC ULTRASOUND (EUS) RADIAL;  Surgeon:  Lemar Lofty., MD;  Location: [facility] ENDOSCOPY;  Service: Gastroenterology;  Laterality: N/A;   FINGER SURGERY     right   FOOT SURGERY     right   HEMOSTASIS CLIP PLACEMENT  07/11/2022   Procedure: HEMOSTASIS CLIP PLACEMENT;  Surgeon: Meridee Score Netty Starring., MD;  Location: [facility] ENDOSCOPY;  Service: Gastroenterology;;  ovesco    HEMOSTASIS CLIP PLACEMENT  10/13/2022   Procedure: HEMOSTASIS CLIP PLACEMENT;  Surgeon: Lemar Lofty., MD;  Location: [facility] ENDOSCOPY;  Service: Gastroenterology;;   HOT HEMOSTASIS N/A 07/11/2022   Procedure: HOT HEMOSTASIS (ARGON PLASMA COAGULATION/BICAP);  Surgeon: Lemar Lofty., MD;  Location: [facility] ENDOSCOPY;  Service: Gastroenterology;  Laterality: N/A;   HOT HEMOSTASIS N/A 10/13/2022   Procedure: HOT HEMOSTASIS (ARGON PLASMA COAGULATION/BICAP);  Surgeon: Lemar Lofty., MD;  Location: [facility] ENDOSCOPY;  Service: Gastroenterology;  Laterality: N/A;   INGUINAL HERNIA REPAIR     right   IR FLUORO PROCEDURE UNLISTED  02/21/2023   IR GASTROSTOMY TUBE REMOVAL  02/10/2022   IR REPLACE G-TUBE SIMPLE WO FLUORO  03/14/2023   IR REPLC GASTRO/COLONIC TUBE PERCUT W/FLUORO  12/22/2022   IR REPLC GASTRO/COLONIC TUBE PERCUT W/FLUORO  01/26/2023   IR REPLC GASTRO/COLONIC TUBE PERCUT W/FLUORO  03/06/2023   IR REPLC GASTRO/COLONIC TUBE PERCUT W/FLUORO  04/27/2023   IRRIGATION AND DEBRIDEMENT ABSCESS N/A 06/04/2020   Procedure: IRRIGATION AND DEBRIDEMENT SACRAL WOUND;  Surgeon: Diamantina Monks, MD;  Location: [facility] OR;  Service: General;  Laterality: N/A;   LAPAROSCOPY N/A 06/04/2020   Procedure: LAPAROSCOPY ASSISTED COLOSTOMY, LAPAROSCOPY GASTROSTOMY;  Surgeon: Diamantina Monks, MD;  Location: [facility] OR;  Service: General;  Laterality: N/A;   LAPAROSCOPY N/A 05/24/2023   Procedure: LAPAROSCOPY DIAGNOSTIC;  Surgeon: Emelia Loron, MD;  Location: [facility] OR;  Service: General;  Laterality: N/A;   LAPAROTOMY N/A 05/24/2023   Procedure: WEDGED RESECTION OF STOMACH AND  GASTROCUTANEOUS FISTULA;  Surgeon: Emelia Loron, MD;  Location: [facility] OR;  Service: General;  Laterality: N/A;   LYSIS OF ADHESION N/A 06/04/2020   Procedure: LYSIS OF ADHESION;  Surgeon: Diamantina Monks, MD;  Location: [facility] OR;  Service: General;  Laterality: N/A;   LYSIS OF ADHESION N/A 05/24/2023   Procedure: LYSIS OF ADHESION;  Surgeon: Emelia Loron, MD;  Location: [facility] OR;  Service: General;  Laterality: N/A;   melanoma removal     neck   ORTHOPEDIC SURGERY     foot right   POLYPECTOMY  10/13/2022   Procedure: POLYPECTOMY;  Surgeon: Lemar Lofty., MD;  Location: [facility] ENDOSCOPY;  Service: Gastroenterology;;   rotator cuff surg     Bil   STENT REMOVAL  10/13/2022   Procedure: CLIP REMOVAL;  Surgeon: Lemar Lofty., MD;  Location: [facility] ENDOSCOPY;  Service: Gastroenterology;;   SUBMUCOSAL LIFTING INJECTION  10/13/2022   Procedure: SUBMUCOSAL LIFTING INJECTION;  Surgeon: Lemar Lofty., MD;  Location: [facility] ENDOSCOPY;  Service: Gastroenterology;;   SUBMUCOSAL TATTOO INJECTION  10/13/2022   Procedure: SUBMUCOSAL TATTOO INJECTION;  Surgeon: Lemar Lofty., MD;  Location: [facility] ENDOSCOPY;  Service: Gastroenterology;;     A IV Location/Drains/Wounds Patient Lines/Drains/Airways Status     Active Line/Drains/Airways  Name Placement date Placement time Site Days   Peripheral IV 06/06/23 20 G Anterior;Left;Proximal Forearm 06/06/23  0237  Forearm  less than 1   Colostomy LLQ --  --  LLQ  --   Suprapubic Catheter Latex 20 Fr. 02/22/23  1550  Latex  104   Incision - 4 Ports Abdomen 1: Right 2: Umbilicus 3: Left 4: Left;Lateral 05/24/23  1416  -- 13   Pressure Injury 02/22/23 02/22/23  0800  -- 104   Pressure Injury 05/21/23 Buttocks Bilateral Stage 3 -  Full thickness tissue loss. Subcutaneous fat may be visible but bone, tendon or muscle are NOT exposed. pink stage 3 on sacral area with areas of yellow slough 05/21/23  1651  -- 16   Pressure Injury 05/21/23  Thigh Posterior;Proximal;Right Stage 3 -  Full thickness tissue loss. Subcutaneous fat may be visible but bone, tendon or muscle are NOT exposed. pink  area with white/yellow center 05/21/23  1653  -- 16   Wound / Incision (Open or Dehisced) 05/21/23 Diabetic ulcer Foot Right black round wound 05/21/23  1701  Foot  16            Intake/Output Last 24 hours  Intake/Output Summary (Last 24 hours) at 06/06/2023 0521 Last data filed at 06/06/2023 0520 Gross per 24 hour  Intake 100.56 ml  Output --  Net 100.56 ml    Labs/Imaging Results for orders placed or performed during the hospital encounter of 06/05/23 (from the past 48 hour(s))  Comprehensive metabolic panel     Status: Abnormal   Collection Time: 06/05/23 12:26 PM  Result Value Ref Range   Sodium 134 (L) 135 - 145 mmol/L   Potassium 4.4 3.5 - 5.1 mmol/L   Chloride 105 98 - 111 mmol/L   CO2 19 (L) 22 - 32 mmol/L   Glucose, Bld 233 (H) 70 - 99 mg/dL    Comment: Glucose reference range applies only to samples taken after fasting for at least 8 hours.   BUN 20 8 - 23 mg/dL   Creatinine, Ser 0.27 0.61 - 1.24 mg/dL   Calcium 8.5 (L) 8.9 - 10.3 mg/dL   Total Protein 6.3 (L) 6.5 - 8.1 g/dL   Albumin 2.8 (L) 3.5 - 5.0 g/dL   AST 25 15 - 41 U/L   ALT 25 0 - 44 U/L   Alkaline Phosphatase 103 38 - 126 U/L   Total Bilirubin 0.4 0.3 - 1.2 mg/dL   GFR, Estimated >25 >36 mL/min    Comment: (NOTE) Calculated using the CKD-EPI Creatinine Equation (2021)    Anion gap 10 5 - 15    Comment: Performed at [facility], [address] [place], [place] [address]  CBC with Differential     Status: Abnormal   Collection Time: 06/05/23 12:26 PM  Result Value Ref Range   WBC 16.0 (H) 4.0 - 10.5 K/uL   RBC 4.04 (L) 4.22 - 5.81 MIL/uL   Hemoglobin 11.0 (L) 13.0 - 17.0 g/dL   HCT 47.4 (L) 25.9 - 56.3 %   MCV 89.9 80.0 - 100.0 fL   MCH 27.2 26.0 - 34.0 pg   MCHC 30.3 30.0 - 36.0 g/dL   RDW 87.5 (H) 64.3 - 32.9 %   Platelets 384 150 -  400 K/uL   nRBC 0.0 0.0 - 0.2 %   Neutrophils Relative % 80 %   Neutro Abs 12.7 (H) 1.7 - 7.7 K/uL   Lymphocytes Relative 11 %   Lymphs  Abs 1.8 0.7 - 4.0 K/uL   Monocytes Relative 5 %   Monocytes Absolute 0.8 0.1 - 1.0 K/uL   Eosinophils Relative 3 %   Eosinophils Absolute 0.5 0.0 - 0.5 K/uL   Basophils Relative 0 %   Basophils Absolute 0.1 0.0 - 0.1 K/uL   Immature Granulocytes 1 %   Abs Immature Granulocytes 0.20 (H) 0.00 - 0.07 K/uL    Comment: Performed at [facility], [address] [place], [place] [address]  Lipase, blood     Status: None   Collection Time: 06/05/23 12:26 PM  Result Value Ref Range   Lipase 36 11 - 51 U/L    Comment: Performed at [facility], [address], [place], [place] [address]  Lactic acid, plasma     Status: Abnormal   Collection Time: 06/05/23 12:36 PM  Result Value Ref Range   Lactic Acid, Venous 2.4 (HH) 0.5 - 1.9 mmol/L    Comment: CRITICAL RESULT CALLED TO, READ BACK BY AND VERIFIED WITH R.GONZALES,RN 1315 06/05/23 CLARK,S Performed at [facility], [address], [place], [place] [address]   Lactic acid, plasma     Status: Abnormal   Collection Time: 06/05/23  2:36 PM  Result Value Ref Range   Lactic Acid, Venous 2.5 (HH) 0.5 - 1.9 mmol/L    Comment: CRITICAL VALUE NOTED. VALUE IS CONSISTENT WITH PREVIOUSLY REPORTED/CALLED VALUE Performed at [facility], [address], [place], [place] [address]   Urinalysis, Routine w reflex microscopic -Urine, Clean Catch     Status: Abnormal   Collection Time: 06/05/23  6:43 PM  Result Value Ref Range   Color, Urine YELLOW YELLOW   APPearance CLEAR CLEAR   Specific Gravity, Urine 1.032 (H) 1.005 - 1.030   pH 5.0 5.0 - 8.0   Glucose, UA 50 (A) NEGATIVE mg/dL   Hgb urine dipstick NEGATIVE NEGATIVE   Bilirubin Urine NEGATIVE NEGATIVE   Ketones, ur NEGATIVE NEGATIVE mg/dL   Protein, ur 30 (A) NEGATIVE mg/dL   Nitrite NEGATIVE NEGATIVE   Leukocytes,Ua SMALL (A) NEGATIVE    RBC / HPF 0-5 0 - 5 RBC/hpf   WBC, UA 11-20 0 - 5 WBC/hpf   Bacteria, UA NONE SEEN NONE SEEN   Squamous Epithelial / HPF 0-5 0 - 5 /HPF   Mucus PRESENT     Comment: Performed at [facility], [address] [place], [place] [address]  CBG monitoring, ED     Status: Abnormal   Collection Time: 06/05/23  9:26 PM  Result Value Ref Range   Glucose-Capillary 126 (H) 70 - 99 mg/dL    Comment: Glucose reference range applies only to samples taken after fasting for at least 8 hours.  Comprehensive metabolic panel     Status: Abnormal   Collection Time: 06/06/23  2:36 AM  Result Value Ref Range   Sodium 132 (L) 135 - 145 mmol/L   Potassium 4.1 3.5 - 5.1 mmol/L   Chloride 103 98 - 111 mmol/L   CO2 19 (L) 22 - 32 mmol/L   Glucose, Bld 180 (H) 70 - 99 mg/dL    Comment: Glucose reference range applies only to samples taken after fasting for at least 8 hours.   BUN 15 8 - 23 mg/dL   Creatinine, Ser 3.66 0.61 - 1.24 mg/dL   Calcium 8.3 (L) 8.9 - 10.3 mg/dL   Total Protein 5.4 (L) 6.5 - 8.1 g/dL   Albumin 2.4 (L)  3.5 - 5.0 g/dL   AST 22 15 - 41 U/L   ALT 21 0 - 44 U/L   Alkaline Phosphatase 86 38 - 126 U/L   Total Bilirubin 0.3 0.3 - 1.2 mg/dL   GFR, Estimated >08 >65 mL/min    Comment: (NOTE) Calculated using the CKD-EPI Creatinine Equation (2021)    Anion gap 10 5 - 15    Comment: Performed at [facility], [address], [place], [place] [address]  CBC     Status: Abnormal   Collection Time: 06/06/23  2:36 AM  Result Value Ref Range   WBC 14.5 (H) 4.0 - 10.5 K/uL   RBC 3.63 (L) 4.22 - 5.81 MIL/uL   Hemoglobin 9.7 (L) 13.0 - 17.0 g/dL   HCT 62.9 (L) 52.8 - 41.3 %   MCV 88.2 80.0 - 100.0 fL   MCH 26.7 26.0 - 34.0 pg   MCHC 30.3 30.0 - 36.0 g/dL   RDW 24.4 (H) 01.0 - 27.2 %   Platelets 361 150 - 400 K/uL   nRBC 0.0 0.0 - 0.2 %    Comment: Performed at [facility], [address] [place], [place] [address]  Hemoglobin A1c     Status: Abnormal   Collection  Time: 06/06/23  2:36 AM  Result Value Ref Range   Hgb A1c MFr Bld 7.5 (H) 4.8 - 5.6 %    Comment: (NOTE) Pre diabetes:          5.7%-6.4%  Diabetes:              >6.4%  Glycemic control for   <7.0% adults with diabetes    Mean Plasma Glucose 168.55 mg/dL    Comment: Performed at [facility], [address] [place], [place] [address]  Lactic acid, plasma     Status: Abnormal   Collection Time: 06/06/23  2:36 AM  Result Value Ref Range   Lactic Acid, Venous 2.4 (HH) 0.5 - 1.9 mmol/L    Comment: CRITICAL VALUE NOTED. VALUE IS CONSISTENT WITH PREVIOUSLY REPORTED/CALLED VALUE Performed at [facility], [address], [place], [place] [address]    CT ABDOMEN PELVIS W CONTRAST  Result Date: 06/05/2023 CLINICAL DATA:  Nonlocalized abdominal pain. Wound infection. History of gastric cutaneous fistula. EXAM: CT ABDOMEN AND PELVIS WITH CONTRAST TECHNIQUE: Multidetector CT imaging of the abdomen and pelvis was performed using the standard protocol following bolus administration of intravenous contrast. RADIATION DOSE REDUCTION: This exam was performed according to the departmental dose-optimization program which includes automated exposure control, adjustment of the mA and/or kV according to patient size and/or use of iterative reconstruction technique. CONTRAST:  75mL OMNIPAQUE IOHEXOL 350 MG/ML SOLN COMPARISON:  CT 05/21/2023 FINDINGS: Lower chest: Stable tiny pleural effusions. Trace pericardial fluid. Coronary artery calcifications are seen. Gynecomastia. There is some linear opacity seen at the bases likely scar or atelectasis. Breathing motion. Hepatobiliary: Diffuse fatty liver infiltration. No space-occupying liver lesion. Previous cholecystectomy. Patent portal vein. Pancreas: Unremarkable. No pancreatic ductal dilatation or surrounding inflammatory changes. Spleen: Normal in size without focal abnormality. Adrenals/Urinary Tract: Adrenal glands are preserved. Once again there are  some benign bilateral renal cystic foci. Largest on the left medially measures diameter of up to 3.5 cm. Hounsfield unit of 0. No imaging follow-up. There is also a rounded dense calcified structure along the upper aspect of the left kidney which is stable and again likely a benign lesion. Previously this has felt to represent retained contrast  with some parenchymal atrophy. Please correlate with the history. The ureters have normal course and caliber extending down to the urinary bladder. Suprapubic catheter in the decompressed urinary bladder. Similar to previous. Stomach/Bowel: On this non oral contrast exam, once again there is sigmoid colon stool moderate stool with diverticula. Once again there is a left lower quadrant ostomy for the colon. Both diverting ostomy and mucous fistula in the ostomy site. More proximally the large bowel is nondilated. Scattered stool. Normal appendix in the right lower quadrant posterior to the cecum. Moderate debris in the stomach. Surgical changes along the anterior gastric wall at the antrum is new from previous. The previous fistula to the stomach is no longer seen. Please correlate for interval surgical repair. The small bowel is nondilated. Vascular/Lymphatic: Aortic atherosclerosis. No enlarged abdominal or pelvic lymph nodes. Reproductive: Prostate is unremarkable. Other: Anasarca. Presumed surgical changes along the upper abdomen with some scattered ill-defined fluid anterior to the stomach. No clear rim enhancing fluid collections or significant extraluminal gas. There are 2 focal anterior abdominal wall subcutaneous fat and skin defects identified 1 in the right upper quadrant on axial series 3, image 38. The other anterior to the antrum of the stomach. There is also some air and fluid in the area of the umbilicus. Musculoskeletal: Once again osteopenia with the advanced degenerative changes along the spine and pelvis. Multilevel stenosis along the spine. Bridging  osteophytes identified. Soft tissue abnormality of the right ischial region with extension down to bone with sclerosis. Unchanged from previous. Please correlate for a chronic decubitus ulcer. There also truncation of the lower sacrum and coccyx which is stable. 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 Some ill-defined fluid in the anterior upper abdomen. No clear rim enhancing fluid collection or significant free air. No signs of bowel obstruction. The focal soft tissue defect along the anterior abdominal wall, 1 on each side in the upper abdomen may be postsurgical as well. Please correlate with clinical findings. 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 Colonic diverticulosis. Suprapubic catheter. Again changes of known previous decubitus ulcers in the pelvis. Stable tiny pleural effusions. Electronically Signed   By: Karen Kays M.D.   On: 06/05/2023 16:49    Pending Labs Unresulted Labs (From admission, onward)     Start     Ordered   06/06/23 0500  Comprehensive metabolic panel  Daily,   R      06/05/23 2100   06/06/23 0500  CBC  Daily,   R      06/05/23 2100   06/05/23 2101  Culture, blood (Routine X 2) w Reflex to ID Panel  BLOOD CULTURE X 2,   R (with TIMED occurrences)     Question Answer Comment  Patient immune status Normal   Release to patient Immediate      06/05/23 2100   06/05/23 1319  Culture, blood (Routine X 2) w Reflex to ID Panel  BLOOD CULTURE X 2,   R (with STAT occurrences)      06/05/23 1318            Vitals/Pain Today's Vitals   06/05/23 2330 06/05/23 2345 06/06/23 0036 06/06/23 0314  BP: (!) 117/48 (!) 120/49  (!) 111/48  Pulse: 63 63  60  Resp: 13 15  14   Temp:    99.1 F (37.3 C)  TempSrc:    Oral  SpO2: 100% 100%  95%  Weight:      Height:  PainSc:   6      Isolation Precautions No active isolations  Medications Medications  oxyCODONE (Oxy IR/ROXICODONE) immediate release tablet 5 mg  (has no administration in time range)  atorvastatin (LIPITOR) tablet 40 mg (40 mg Oral Given 06/05/23 2146)  DULoxetine (CYMBALTA) DR capsule 30 mg (30 mg Oral Given 06/05/23 2147)  LORazepam (ATIVAN) tablet 0.5 mg (has no administration in time range)  traZODone (DESYREL) tablet 75 mg (75 mg Oral Given 06/05/23 2146)  pantoprazole (PROTONIX) EC tablet 40 mg (has no administration in time range)  senna-docusate (Senokot-S) tablet 1 tablet (1 tablet Oral Given 06/05/23 2147)  baclofen (LIORESAL) tablet 20 mg (20 mg Oral Given 06/05/23 2146)  lamoTRIgine (LAMICTAL) tablet 50 mg (50 mg Oral Given 06/05/23 2146)  tiZANidine (ZANAFLEX) tablet 2 mg (2 mg Oral Given 06/05/23 2146)  nystatin (MYCOSTATIN/NYSTOP) topical powder 1 Application (1 Application Topical Given 06/05/23 2147)  enoxaparin (LOVENOX) injection 40 mg (40 mg Subcutaneous Given 06/05/23 2147)  sodium chloride flush (NS) 0.9 % injection 3 mL (3 mLs Intravenous Not Given 06/05/23 2149)  sodium chloride flush (NS) 0.9 % injection 3 mL (3 mLs Intravenous Not Given 06/05/23 2149)  sodium chloride flush (NS) 0.9 % injection 3 mL (has no administration in time range)  0.9 %  sodium chloride infusion (has no administration in time range)  acetaminophen (TYLENOL) tablet 650 mg (650 mg Oral Given 06/06/23 0036)    Or  acetaminophen (TYLENOL) suppository 650 mg ( Rectal See Alternative 06/06/23 0036)  ondansetron (ZOFRAN) tablet 4 mg (has no administration in time range)    Or  ondansetron (ZOFRAN) injection 4 mg (has no administration in time range)  insulin aspart (novoLOG) injection 0-6 Units (has no administration in time range)  insulin aspart (novoLOG) injection 0-5 Units ( Subcutaneous Not Given 06/05/23 2128)  lactated ringers infusion ( Intravenous New Bag/Given 06/05/23 2148)  hydrALAZINE (APRESOLINE) injection 10 mg (has no administration in time range)  insulin glargine-yfgn (SEMGLEE) injection 35 Units (35 Units Subcutaneous Given 06/05/23  2237)  ampicillin-sulbactam (UNASYN) 1.5 g in sodium chloride 0.9 % 100 mL IVPB (0 g Intravenous Stopped 06/06/23 0517)  iohexol (OMNIPAQUE) 350 MG/ML injection 75 mL (75 mLs Intravenous Contrast Given 06/05/23 1546)  Ampicillin-Sulbactam (UNASYN) 3 g in sodium chloride 0.9 % 100 mL IVPB (0 g Intravenous Stopped 06/05/23 2102)  lactated ringers bolus 1,000 mL (0 mLs Intravenous Stopped 06/06/23 0158)    Mobility non-ambulatory     Focused Assessments Cardiac Assessment Handoff:    Lab Results  Component Value Date   CKTOTAL 119 03/15/2020   CKMB 1.9 02/14/2011   TROPONINI  02/14/2011    <0.30        Due to the release kinetics of cTnI, a negative result within the first hours of the onset of symptoms does not rule out myocardial infarction with certainty. If myocardial infarction is still suspected, repeat the test at appropriate intervals. **Please note change in reference range.**   Lab Results  Component Value Date   DDIMER 0.74 (H) 11/01/2021   Does the Patient currently have chest pain? No   , Neuro Assessment Handoff:  Swallow screen pass? Yes          Neuro Assessment: Within Defined Limits  R Recommendations: See Admitting Provider Note  Report given to:   Additional Notes: PT A&Ox4. Pt is paraplegic. Suprapubic catheter and colostomy.

## 2023-06-06 NOTE — Consult Note (Addendum)
WOC Nurse Consult Note: Reason for Consult: Consult was requested for wounds. Surgical team has been following for assessment and plan of care.  Pt is familiar to [facility] team from a recent admission on 9/2; refer to progress notes for wound locations and plan of care.  Pt has discharge orders and is planiing to leave today so WOC team did not perform a consult.  Please re-consult if further assistance is needed.  Thank-you,  Cammie Mcgee MSN, RN, CWOCN, Daisy, CNS [phone]

## 2023-06-06 NOTE — ED Notes (Signed)
Pt called out stating he feels that he is being kept against his will. This RN explained that pt was being admitted to the hospital and has the right to leave AMA if that is his wishes. Pt then states he was not given sleep medication. This RN informed pt he was given Trazodone with night meds. Pt states "well I am not sleepy". MD Sundil made aware.

## 2023-06-06 NOTE — Progress Notes (Signed)
PROGRESS NOTE    Brent [name].  AOZ:[id number] DOB: Sep 03, 1946 DOA: 06/05/2023 PCP: [name], MD   Brief Narrative:  77 year old with history of GI fistula status post wedge resection 9/24, quadriplegia secondary to transverse myelitis, neurogenic bladder status post suprapubic catheter, stage IV decubitus ulcer, insulin-dependent DM2 comes to the ED from nursing home for concerns of gastrocutaneous fistula infection.  About a week ago patient underwent gastric fistula removal laparoscopically but wound was not closed, postop recommended wet-to-dry dressing and 7 days of Augmentin.  In the ED CT scan showed surgical takedown of gastrocutaneous fistula, general surgery was notified and patient was admitted for IV antibiotics.   Assessment & Plan:  Principal Problem:   SIRS (systemic inflammatory response syndrome) (HCC) Active Problems:   External gastrointestinal fistula site infection   Insulin dependent type 2 diabetes mellitus (HCC)   Essential hypertension   Quadriplegia (HCC)   Neurogenic bladder   Stage IV pressure ulcer of sacral region [facility])   Suprapubic catheter (HCC)    SIRS Concern for gastrocutaneous fistula site infection Leakage from gastrointestinal fistula Gastrointestinal fistula status post wedge resection on 05/24/2023 Lactic acidosis -On the CT scan there does not appear to be any obvious evidence of infection and patient was already on antibiotics outpatient but due to leukocytosis, will continue empiric IV Unasyn, give IV fluids for lactic acidosis.  General surgery has been consulted.   Quadriplegia secondary to transverse myelitis Neurogenic bladder status post suprapubic catheter -Continue home medication including baclofen, lamotrigine, Zanaflex and as needed oxycodone   Essential hypertension -Continue home p.o. medications once blood pressure remained stable.  At home patient appears to be on atenolol, Aldactone and losartan   Stage IV  sacral ulcer -Routine nursing care   Insulin-dependent DM type II Home p.o. medication on hold.  A1c 7.5.  Continue long-acting insulin, adjust as necessary.   General Anxiety disorder -Currently on Cymbalta, Lamictal   Hyperlipidemia - Continue Lipitor    DVT prophylaxis: Lovenox Code Status: Full code Family Communication:   Cont hosp stay until cleard by Gen Sx.     Subjective: No complaints doing okay   Examination:  General exam: Appears calm and comfortable  Respiratory system: Clear to auscultation. Respiratory effort normal. Cardiovascular system: S1 & S2 heard, RRR. No JVD, murmurs, rubs, gallops or clicks. No pedal edema. Gastrointestinal system: Abdomen is nondistended, soft and nontender. No organomegaly or masses felt. Normal bowel sounds heard. Central nervous system: Alert and oriented.  Quadriplegia Extremities: quadraPlegic Skin: Surgical site noted with dressing Psychiatry: Judgement and insight appear normal. Mood & affect appropriate.  Pressure Injury 05/21/23 Buttocks Bilateral Stage 3 -  Full thickness tissue loss. Subcutaneous fat may be visible but bone, tendon or muscle are NOT exposed. pink stage 3 on sacral area with areas of yellow slough (Active)  05/21/23 1651  Location: Buttocks  Location Orientation: Bilateral  Staging: Stage 3 -  Full thickness tissue loss. Subcutaneous fat may be visible but bone, tendon or muscle are NOT exposed.  Wound Description (Comments): pink stage 3 on sacral area with areas of yellow slough  Present on Admission: Yes     Pressure Injury 05/21/23 Thigh Posterior;Proximal;Right Stage 3 -  Full thickness tissue loss. Subcutaneous fat may be visible but bone, tendon or muscle are NOT exposed. pink  area with white/yellow center (Active)  05/21/23 1653  Location: Thigh  Location Orientation: Posterior;Proximal;Right  Staging: Stage 3 -  Full thickness tissue loss. Subcutaneous fat may be  visible but bone, tendon or  muscle are NOT exposed.  Wound Description (Comments): pink  area with white/yellow center  Present on Admission: Yes     Diet Orders (From admission, onward)     Start     Ordered   06/05/23 2101  Diet heart healthy/carb modified Room service appropriate? Yes; Fluid consistency: Thin  Diet effective now       Question Answer Comment  Diet-HS Snack? Nothing   Room service appropriate? Yes   Fluid consistency: Thin      06/05/23 2100            Objective: Vitals:   06/06/23 0314 06/06/23 0530 06/06/23 0610 06/06/23 0738  BP: (!) 111/48 (!) 115/43 (!) 130/43 (!) 130/48  Pulse: 60 (!) 59 66 (!) 58  Resp: 14 16 16    Temp: 99.1 F (37.3 C) 98.4 F (36.9 C) (!) 97.5 F (36.4 C) 98.3 F (36.8 C)  TempSrc: Oral Oral Oral Oral  SpO2: 95% 95% 98% 100%  Weight:      Height:        Intake/Output Summary (Last 24 hours) at 06/06/2023 0827 Last data filed at 06/06/2023 0532 Gross per 24 hour  Intake 100.56 ml  Output 1350 ml  Net -1249.44 ml   Filed Weights   06/05/23 1133  Weight: 97 kg    Scheduled Meds:  atorvastatin  40 mg Oral QHS   baclofen  20 mg Oral TID   DULoxetine  30 mg Oral Daily   enoxaparin (LOVENOX) injection  40 mg Subcutaneous Q24H   insulin aspart  0-5 Units Subcutaneous QHS   insulin aspart  0-6 Units Subcutaneous TID WC   insulin glargine-yfgn  35 Units Subcutaneous BID   lamoTRIgine  50 mg Oral BID   nystatin  1 Application Topical Daily   pantoprazole  40 mg Oral Daily   senna-docusate  1 tablet Oral BID   sodium chloride flush  3 mL Intravenous Q12H   sodium chloride flush  3 mL Intravenous Q12H   tiZANidine  2 mg Oral QHS   traZODone  75 mg Oral QHS   Continuous Infusions:  sodium chloride     ampicillin-sulbactam (UNASYN) IV Stopped (06/06/23 0517)   lactated ringers 100 mL/hr at 06/05/23 2148    Nutritional status     Body mass index is 29 kg/m.  Data Reviewed:   CBC: Recent Labs  Lab 05/31/23 0252 06/05/23 1226  06/06/23 0236  WBC 10.7* 16.0* 14.5*  NEUTROABS  --  12.7*  --   HGB 9.6* 11.0* 9.7*  HCT 31.2* 36.3* 32.0*  MCV 86.2 89.9 88.2  PLT 321 384 361   Basic Metabolic Panel: Recent Labs  Lab 05/31/23 0252 06/05/23 1226 06/06/23 0236  NA 132* 134* 132*  K 3.9 4.4 4.1  CL 101 105 103  CO2 24 19* 19*  GLUCOSE 290* 233* 180*  BUN 14 20 15   CREATININE 0.61 0.74 0.67  CALCIUM 8.1* 8.5* 8.3*   GFR: Estimated Creatinine Clearance: 91.9 mL/min (by C-G formula based on SCr of 0.67 mg/dL). Liver Function Tests: Recent Labs  Lab 05/31/23 0252 06/05/23 1226 06/06/23 0236  AST 21 25 22   ALT 21 25 21   ALKPHOS 90 103 86  BILITOT 0.3 0.4 0.3  PROT 5.4* 6.3* 5.4*  ALBUMIN 2.3* 2.8* 2.4*   Recent Labs  Lab 06/05/23 1226  LIPASE 36   No results for input(s): "AMMONIA" in the last 168 hours. Coagulation Profile: No results for input(s): "  INR", "PROTIME" in the last 168 hours. Cardiac Enzymes: No results for input(s): "CKTOTAL", "CKMB", "CKMBINDEX", "TROPONINI" in the last 168 hours. BNP (last 3 results) No results for input(s): "PROBNP" in the last 8760 hours. HbA1C: Recent Labs    06/06/23 0236  HGBA1C 7.5*   CBG: Recent Labs  Lab 05/31/23 0731 05/31/23 1130 05/31/23 1702 05/31/23 2023 06/05/23 2126  GLUCAP 202* 173* 231* 270* 126*   Lipid Profile: No results for input(s): "CHOL", "HDL", "LDLCALC", "TRIG", "CHOLHDL", "LDLDIRECT" in the last 72 hours. Thyroid Function Tests: No results for input(s): "TSH", "T4TOTAL", "FREET4", "T3FREE", "THYROIDAB" in the last 72 hours. Anemia Panel: No results for input(s): "VITAMINB12", "FOLATE", "FERRITIN", "TIBC", "IRON", "RETICCTPCT" in the last 72 hours. Sepsis Labs: Recent Labs  Lab 06/05/23 1236 06/05/23 1436 06/06/23 0236  LATICACIDVEN 2.4* 2.5* 2.4*    No results found for this or any previous visit (from the past 240 hour(s)).       Radiology Studies: CT ABDOMEN PELVIS W CONTRAST  Result Date:  06/05/2023 CLINICAL DATA:  Nonlocalized abdominal pain. Wound infection. History of gastric cutaneous fistula. EXAM: CT ABDOMEN AND PELVIS WITH CONTRAST TECHNIQUE: Multidetector CT imaging of the abdomen and pelvis was performed using the standard protocol following bolus administration of intravenous contrast. RADIATION DOSE REDUCTION: This exam was performed according to the departmental dose-optimization program which includes automated exposure control, adjustment of the mA and/or kV according to patient size and/or use of iterative reconstruction technique. CONTRAST:  75mL OMNIPAQUE IOHEXOL 350 MG/ML SOLN COMPARISON:  CT 05/21/2023 FINDINGS: Lower chest: Stable tiny pleural effusions. Trace pericardial fluid. Coronary artery calcifications are seen. Gynecomastia. There is some linear opacity seen at the bases likely scar or atelectasis. Breathing motion. Hepatobiliary: Diffuse fatty liver infiltration. No space-occupying liver lesion. Previous cholecystectomy. Patent portal vein. Pancreas: Unremarkable. No pancreatic ductal dilatation or surrounding inflammatory changes. Spleen: Normal in size without focal abnormality. Adrenals/Urinary Tract: Adrenal glands are preserved. Once again there are some benign bilateral renal cystic foci. Largest on the left medially measures diameter of up to 3.5 cm. Hounsfield unit of 0. No imaging follow-up. There is also a rounded dense calcified structure along the upper aspect of the left kidney which is stable and again likely a benign lesion. Previously this has felt to represent retained contrast with some parenchymal atrophy. Please correlate with the history. The ureters have normal course and caliber extending down to the urinary bladder. Suprapubic catheter in the decompressed urinary bladder. Similar to previous. Stomach/Bowel: On this non oral contrast exam, once again there is sigmoid colon stool moderate stool with diverticula. Once again there is a left lower  quadrant ostomy for the colon. Both diverting ostomy and mucous fistula in the ostomy site. More proximally the large bowel is nondilated. Scattered stool. Normal appendix in the right lower quadrant posterior to the cecum. Moderate debris in the stomach. Surgical changes along the anterior gastric wall at the antrum is new from previous. The previous fistula to the stomach is no longer seen. Please correlate for interval surgical repair. The small bowel is nondilated. Vascular/Lymphatic: Aortic atherosclerosis. No enlarged abdominal or pelvic lymph nodes. Reproductive: Prostate is unremarkable. Other: Anasarca. Presumed surgical changes along the upper abdomen with some scattered ill-defined fluid anterior to the stomach. No clear rim enhancing fluid collections or significant extraluminal gas. There are 2 focal anterior abdominal wall subcutaneous fat and skin defects identified 1 in the right upper quadrant on axial series 3, image 38. The other anterior to the antrum of  the stomach. There is also some air and fluid in the area of the umbilicus. Musculoskeletal: Once again osteopenia with the advanced degenerative changes along the spine and pelvis. Multilevel stenosis along the spine. Bridging osteophytes identified. Soft tissue abnormality of the right ischial region with extension down to bone with sclerosis. Unchanged from previous. Please correlate for a chronic decubitus ulcer. There also truncation of the lower sacrum and coccyx which is stable. 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 Some ill-defined fluid in the anterior upper abdomen. No clear rim enhancing fluid collection or significant free air. No signs of bowel obstruction. The focal soft tissue defect along the anterior abdominal wall, 1 on each side in the upper abdomen may be postsurgical as well. Please correlate with clinical findings. 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 Colonic  diverticulosis. Suprapubic catheter. Again changes of known previous decubitus ulcers in the pelvis. Stable tiny pleural effusions. Electronically Signed   By: Karen Kays M.D.   On: 06/05/2023 16:49           LOS: 0 days   Time spent= 35 mins    Miguel Rota, MD [facility]  If 7PM-7AM, please contact night-coverage  06/06/2023, 8:27 AM

## 2023-06-06 NOTE — Discharge Summary (Signed)
Physician Discharge Summary  [name]. WGN:[id number] DOB: July 13, 1946 DOA: 06/05/2023  PCP: [name], MD  Admit date: 06/05/2023 Discharge date: 06/06/2023  Admitted From: [facility] Disposition: [facility]  Recommendations for Outpatient Follow-up:  Follow up with PCP in 1-2 weeks Please obtain BMP/CBC in one week your next doctors visit.  Continue iodoform packing to all of his wounds 1-2 times daily.  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  About a week ago patient underwent gastric fistula removal laparoscopically but wound was not closed, postop recommended wet-to-dry dressing and 7 days of Augmentin.  In the ED CT scan showed surgical takedown of gastrocutaneous fistula, general surgery was notified and patient was admitted for IV antibiotics. Patient seen by general surgery, no further need for antibiotic, wound care dressing as above.  Cleared for discharge   Assessment & Plan:  Principal Problem:   SIRS (systemic inflammatory response syndrome) (HCC) Active Problems:   External gastrointestinal fistula site infection   Insulin dependent type 2 diabetes mellitus (HCC)   Essential hypertension   Quadriplegia (HCC)   Neurogenic bladder   Stage IV pressure ulcer of sacral region [facility])   Suprapubic catheter (HCC)     SIRS Concern for gastrocutaneous fistula site infection Leakage from gastrointestinal fistula Gastrointestinal fistula status post wedge resection on 05/24/2023 Lactic acidosis -On the CT scan there does not appear to be any obvious evidence of infection.   Seen by general surgery, dressing instructions as above.  No further antibiotic needs.   Quadriplegia secondary to transverse myelitis Neurogenic bladder status post suprapubic catheter -Continue home medication including baclofen, lamotrigine, Zanaflex and as needed oxycodone   Essential hypertension - Resume regimen   Stage IV sacral ulcer -Routine nursing care   Insulin-dependent DM type II Resume home regimen   General Anxiety disorder -Currently on Cymbalta, Lamictal   Hyperlipidemia - Continue Lipitor    Discharge Diagnoses:  Principal Problem:   SIRS (systemic inflammatory response syndrome) (HCC) Active Problems:   External gastrointestinal fistula site infection   Insulin dependent type 2 diabetes mellitus (HCC)   Essential hypertension   Quadriplegia (HCC)   Neurogenic bladder   Stage IV pressure ulcer of sacral region (HCC)   Suprapubic catheter (HCC)      Consultations: General surgery  Subjective: Feeling well no complaints  Discharge Exam: Vitals:   06/06/23 0610 06/06/23 0738  BP: (!) 130/43 (!) 130/48  Pulse: 66 (!) 58  Resp: 16   Temp: (!) 97.5 F (36.4 C) 98.3 F (36.8 C)  SpO2: 98% 100%   Vitals:   06/06/23 0314 06/06/23 0530 06/06/23 0610 06/06/23 0738  BP: (!) 111/48 (!) 115/43 (!) 130/43 (!) 130/48  Pulse: 60 (!) 59 66 (!) 58  Resp: 14 16 16    Temp: 99.1 F (37.3 C) 98.4 F (36.9 C) (!) 97.5 F (36.4 C) 98.3 F (36.8 C)  TempSrc: Oral Oral Oral Oral  SpO2: 95% 95% 98% 100%  Weight:      Height:          Discharge Instructions   Allergies as of 06/06/2023       Reactions   Cipro [ciprofloxacin Hcl] Other (See Comments)  Hallucinations   Neurontin [gabapentin] Other (See Comments)   Unknown reaction   Levaquin [levofloxacin] Rash   Penicillins Hives, Rash   Tolerated Unasyn, Augmentin, cefepime and cephalexin        Medication List     TAKE these medications    acetaminophen 500 MG tablet Commonly  known as: TYLENOL Take 2 tablets (1,000 mg total) by mouth every 6 (six) hours as needed for mild pain (or Fever >/= 101).   ascorbic acid 500 MG tablet Commonly known as: VITAMIN C Take 500 mg by mouth daily.   atenolol 25 MG tablet Commonly known as: TENORMIN Take 12.5-25 mg by mouth See admin instructions. Take 12.5 mg (1/2 tablet) by mouth once daily in the morning and 25 mg (1 tablet) at bedtime.   atorvastatin 40 MG tablet Commonly known as: LIPITOR Take 1 tablet (40 mg total) by mouth at bedtime.   baclofen 20 MG tablet Commonly known as: LIORESAL Take 20 mg by mouth 3 (three) times daily.   Biofreeze 4 % Gel Generic drug: Menthol (Topical Analgesic) Apply 1 application  topically 2 (two) times daily as needed (pain to back (upper and lower),arms and shoulders).   cholecalciferol 25 MCG (1000 UNIT) tablet Commonly known as: VITAMIN D3 Take 1,000 Units by mouth daily.   DULoxetine 30 MG capsule Commonly known as: CYMBALTA Take 1 capsule (30 mg total) by mouth at bedtime.   famotidine 20 MG tablet Commonly known as: PEPCID Take 20 mg by mouth 2 (two) times daily.   fluticasone 50 MCG/ACT nasal spray Commonly known as: FLONASE Place 2 sprays into both nostrils daily.   GenTeal Tears Severe Day/Night 0.4-0.3 % Gel ophthalmic gel Generic drug: Polyethyl Glycol-Propyl Glycol Place 1 Application into both eyes 4 (four) times daily as needed (dry eyes).   Gerhardt's butt cream Crea Apply 1 Application topically 3 (three) times daily.   insulin aspart 100 UNIT/ML injection Commonly known as: novoLOG Inject 0-15 Units into the skin every 4 (four) hours. Sliding scale  CBG 70 - 120: 0 units: CBG 121 - 150: 2 units; CBG 151 - 200: 3 units; CBG 201 - 250: 5 units; CBG 251 - 300: 8 units;CBG 301 - 350: 11 units; CBG 351 - 400: 15 units; CBG > 400 : 15 units and notify MD What changed:  when to take this additional instructions   insulin glargine 100 unit/mL  Sopn Commonly known as: LANTUS Inject 35 Units into the skin 2 (two) times daily.   lamoTRIgine 25 MG tablet Commonly known as: LAMICTAL Take 2 tablets (50 mg total) by mouth 2 (two) times daily. For 5 days. Started 02/18/23.   leptospermum manuka honey Pste paste Apply 1 Application topically daily. Clean R plantar foot wound with NS, apply Medihoney to wound bed daily, cover with dry gauze and secure with Kerlix roll gauze.  Apply thin layer (3 mm) to wound.   LORazepam 0.5 MG tablet Commonly known as: ATIVAN Take 0.5 mg by mouth daily as needed (panic attacks).   losartan 50 MG tablet Commonly known as: COZAAR Take 75 mg by mouth daily.   metFORMIN 500 MG tablet Commonly known as: GLUCOPHAGE Take 1,000 mg by mouth 2 (two) times daily with a meal.   nystatin powder Apply 1 Application topically daily. Apply to peri-wound bed, sacrum and right ischium daily with routine dressing.   omeprazole 40 MG capsule Commonly known as: PRILOSEC Take 1 capsule (40 mg total) by mouth 2 (two) times daily. 30  minutes before breakfast/dinner What changed:  when to take this additional instructions   ondansetron 4 MG disintegrating tablet Commonly known as: ZOFRAN-ODT Take 4 mg by mouth every 8 (eight) hours as needed for nausea or vomiting.   oxyCODONE 5 MG immediate release tablet Commonly known as: Oxy IR/ROXICODONE Take 5 mg by mouth 2 (two) times daily as needed for breakthrough pain.   senna-docusate 8.6-50 MG tablet Commonly known as: Senokot-S Take 1 tablet by mouth 2 (two) times daily.   spironolactone 25 MG tablet Commonly known as: ALDACTONE Take 25 mg by mouth daily.   tiZANidine 2 MG tablet Commonly known as: ZANAFLEX Take 2 mg by mouth at bedtime.   traMADol 50 MG tablet Commonly known as: ULTRAM Take 1 tablet (50 mg total) by mouth every 12 (twelve) hours as needed for moderate pain or severe pain (Pain).   traZODone 50 MG tablet Commonly known as:  DESYREL Take 1.5 tablets (75 mg total) by mouth at bedtime.        Allergies  Allergen Reactions   Cipro [Ciprofloxacin Hcl] Other (See Comments)    Hallucinations   Neurontin [Gabapentin] Other (See Comments)    Unknown reaction   Levaquin [Levofloxacin] Rash   Penicillins Hives and Rash    Tolerated Unasyn, Augmentin, cefepime and cephalexin    You were cared for by a hospitalist during your hospital stay. If you have any questions about your discharge medications or the care you received while you were in the hospital after you are discharged, you can call the unit and asked to speak with the hospitalist on call if the hospitalist that took care of you is not available. Once you are discharged, your primary care physician will handle any further medical issues. Please note that no refills for any discharge medications will be authorized once you are discharged, as it is imperative that you return to your primary care physician (or establish a relationship with a primary care physician if you do not have one) for your aftercare needs so that they can reassess your need for medications and monitor your lab values.  You were cared for by a hospitalist during your hospital stay. If you have any questions about your discharge medications or the care you received while you were in the hospital after you are discharged, you can call the unit and asked to speak with the hospitalist on call if the hospitalist that took care of you is not available. Once you are discharged, your primary care physician will handle any further medical issues. Please note that NO REFILLS for any discharge medications will be authorized once you are discharged, as it is imperative that you return to your primary care physician (or establish a relationship with a primary care physician if you do not have one) for your aftercare needs so that they can reassess your need for medications and monitor your lab values.  Please  request your Prim.MD to go over all Hospital Tests and Procedure/Radiological results at the follow up, please get all Hospital records sent to your Prim MD by signing hospital release before you go home.  Get CBC, CMP, 2 view Chest X ray checked  by Primary MD during your next visit or SNF MD in 5-7 days ( we routinely change or add medications that can affect your baseline labs and fluid status, therefore we recommend that you get the mentioned basic workup next visit with your PCP, your PCP may decide not to get them or add  new tests based on their clinical decision)  On your next visit with your primary care physician please Get Medicines reviewed and adjusted.  If you experience worsening of your admission symptoms, develop shortness of breath, life threatening emergency, suicidal or homicidal thoughts you must seek medical attention immediately by calling 911 or calling your MD immediately  if symptoms less severe.  You Must read complete instructions/literature along with all the possible adverse reactions/side effects for all the Medicines you take and that have been prescribed to you. Take any new Medicines after you have completely understood and accpet all the possible adverse reactions/side effects.   Do not drive, operate heavy machinery, perform activities at heights, swimming or participation in water activities or provide baby sitting services if your were admitted for syncope or siezures until you have seen by Primary MD or a Neurologist and advised to do so again.  Do not drive when taking Pain medications.   Procedures/Studies: CT ABDOMEN PELVIS W CONTRAST  Result Date: 06/05/2023 CLINICAL DATA:  Nonlocalized abdominal pain. Wound infection. History of gastric cutaneous fistula. EXAM: CT ABDOMEN AND PELVIS WITH CONTRAST TECHNIQUE: Multidetector CT imaging of the abdomen and pelvis was performed using the standard protocol following bolus administration of intravenous contrast.  RADIATION DOSE REDUCTION: This exam was performed according to the departmental dose-optimization program which includes automated exposure control, adjustment of the mA and/or kV according to patient size and/or use of iterative reconstruction technique. CONTRAST:  75mL OMNIPAQUE IOHEXOL 350 MG/ML SOLN COMPARISON:  CT 05/21/2023 FINDINGS: Lower chest: Stable tiny pleural effusions. Trace pericardial fluid. Coronary artery calcifications are seen. Gynecomastia. There is some linear opacity seen at the bases likely scar or atelectasis. Breathing motion. Hepatobiliary: Diffuse fatty liver infiltration. No space-occupying liver lesion. Previous cholecystectomy. Patent portal vein. Pancreas: Unremarkable. No pancreatic ductal dilatation or surrounding inflammatory changes. Spleen: Normal in size without focal abnormality. Adrenals/Urinary Tract: Adrenal glands are preserved. Once again there are some benign bilateral renal cystic foci. Largest on the left medially measures diameter of up to 3.5 cm. Hounsfield unit of 0. No imaging follow-up. There is also a rounded dense calcified structure along the upper aspect of the left kidney which is stable and again likely a benign lesion. Previously this has felt to represent retained contrast with some parenchymal atrophy. Please correlate with the history. The ureters have normal course and caliber extending down to the urinary bladder. Suprapubic catheter in the decompressed urinary bladder. Similar to previous. Stomach/Bowel: On this non oral contrast exam, once again there is sigmoid colon stool moderate stool with diverticula. Once again there is a left lower quadrant ostomy for the colon. Both diverting ostomy and mucous fistula in the ostomy site. More proximally the large bowel is nondilated. Scattered stool. Normal appendix in the right lower quadrant posterior to the cecum. Moderate debris in the stomach. Surgical changes along the anterior gastric wall at the antrum  is new from previous. The previous fistula to the stomach is no longer seen. Please correlate for interval surgical repair. The small bowel is nondilated. Vascular/Lymphatic: Aortic atherosclerosis. No enlarged abdominal or pelvic lymph nodes. Reproductive: Prostate is unremarkable. Other: Anasarca. Presumed surgical changes along the upper abdomen with some scattered ill-defined fluid anterior to the stomach. No clear rim enhancing fluid collections or significant extraluminal gas. There are 2 focal anterior abdominal wall subcutaneous fat and skin defects identified 1 in the right upper quadrant on axial series 3, image 38. The other anterior to the antrum of the stomach.  There is also some air and fluid in the area of the umbilicus. Musculoskeletal: Once again osteopenia with the advanced degenerative changes along the spine and pelvis. Multilevel stenosis along the spine. Bridging osteophytes identified. Soft tissue abnormality of the right ischial region with extension down to bone with sclerosis. Unchanged from previous. Please correlate for a chronic decubitus ulcer. There also truncation of the lower sacrum and coccyx which is stable. 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 Some ill-defined fluid in the anterior upper abdomen. No clear rim enhancing fluid collection or significant free air. No signs of bowel obstruction. The focal soft tissue defect along the anterior abdominal wall, 1 on each side in the upper abdomen may be postsurgical as well. Please correlate with clinical findings. 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 Colonic diverticulosis. Suprapubic catheter. Again changes of known previous decubitus ulcers in the pelvis. Stable tiny pleural effusions. Electronically Signed   By: Karen Kays M.D.   On: 06/05/2023 16:49   DG CHEST PORT 1 VIEW  Result Date: 05/27/2023 CLINICAL DATA:  Pneumonia. EXAM: PORTABLE CHEST 1 VIEW COMPARISON:  March 07, 2023. FINDINGS: Stable cardiomegaly. Minimal bibasilar subsegmental atelectasis or scarring is noted. Right-sided PICC line is noted with distal tip in expected position of the SVC. Bony thorax is unremarkable. IMPRESSION: Minimal bibasilar subsegmental atelectasis or scarring. Electronically Signed   By: Lupita Raider M.D.   On: 05/27/2023 14:50   DG Abd Portable 1V  Result Date: 05/25/2023 CLINICAL DATA:  Ileus following gastrointestinal surgery. EXAM: PORTABLE ABDOMEN - 1 VIEW COMPARISON:  CT abdomen 12/08/2022 and CT abdomen 05/21/2023 FINDINGS: Patient has a left lower abdominal ostomy. Gas-filled loops small bowel in the mid and right abdomen. Small bowel measures close to 3.0 cm. Again noted is high density material / contrast involving the left kidney and this finding has been present on multiple examinations. Few densities at the left costophrenic angle could be associated with atelectasis or overlying structures. IMPRESSION: 1. Gas-filled loops of small bowel in the mid and right abdomen. Findings are nonspecific but could represent a postoperative ileus. Electronically Signed   By: Richarda Overlie M.D.   On: 05/25/2023 14:03   PERIPHERAL VASCULAR CATHETERIZATION  Result Date: 05/24/2023 See surgical note for result.  [place] EKG SITE RITE  Result Date: 05/22/2023 If Site Rite image not attached, placement could not be confirmed due to current cardiac rhythm.  CT ABDOMEN PELVIS W CONTRAST  Result Date: 05/21/2023 CLINICAL DATA:  Abdominal pain near fistula/gastric tube site. EXAM: CT ABDOMEN AND PELVIS WITH CONTRAST TECHNIQUE: Multidetector CT imaging of the abdomen and pelvis was performed using the standard protocol following bolus administration of intravenous contrast. RADIATION DOSE REDUCTION: This exam was performed according to the departmental dose-optimization program which includes automated exposure control, adjustment of the mA and/or kV according to patient size and/or use of iterative  reconstruction technique. CONTRAST:  75mL OMNIPAQUE IOHEXOL 350 MG/ML SOLN COMPARISON:  CT abdomen and pelvis dated 12/08/2022 and CT abdomen and pelvis dated 03/25/2022. FINDINGS: Lower chest: Trace bilateral pleural effusions with associated atelectasis are partially imaged. Hepatobiliary: No focal liver abnormality is seen. Status post cholecystectomy. No biliary dilatation. Pancreas: Unremarkable. No pancreatic ductal dilatation or surrounding inflammatory changes. Spleen: Normal in size without focal abnormality. Adrenals/Urinary Tract: Adrenal glands are unremarkable. Hyperdensity measuring 3.0 cm in the left kidney likely reflects retained contrast. A left renal cyst measures 3.6 cm. No imaging follow-up is recommended for this finding. No  renal calculi or hydronephrosis on either side. A suprapubic catheter is noted and the urinary bladder is deflated. Stomach/Bowel: A gastro-cutaneous fistula along the anterior aspect of the gastric body is redemonstrated. No significant associated fat stranding or inflammatory changes to suggest infection. No evidence of intraperitoneal leakage or abscess formation. The patient is status post a left lower quadrant loop colostomy. Appendix appears normal. There is colonic diverticulosis without evidence of diverticulitis. No evidence of bowel wall thickening, distention, or inflammatory changes. Vascular/Lymphatic: Aortic atherosclerosis. No enlarged abdominal or pelvic lymph nodes. Reproductive: Prostate is unremarkable. Other: No abdominal wall hernia or abnormality. No abdominopelvic ascites. Musculoskeletal: Degenerative changes are seen in the spine. Chronic decubitus ulcer involving the right ischium as well as chronic deformity of the sacrum and coccyx appear similar to prior exam. IMPRESSION: 1. Redemonstration of a gastro-cutaneous fistula without evidence of intraperitoneal leakage or associated inflammatory changes. 2. Trace bilateral pleural effusions with  associated atelectasis. Aortic Atherosclerosis (ICD10-I70.0). Electronically Signed   By: Romona Curls M.D.   On: 05/21/2023 16:21     The results of significant diagnostics from this hospitalization (including imaging, microbiology, ancillary and laboratory) are listed below for reference.     Microbiology: Recent Results (from the past 240 hour(s))  Culture, blood (Routine X 2) w Reflex to ID Panel     Status: None (Preliminary result)   Collection Time: 06/05/23  1:24 PM   Specimen: BLOOD  Result Value Ref Range Status   Specimen Description BLOOD RIGHT ANTECUBITAL  Final   Special Requests   Final    BOTTLES DRAWN AEROBIC AND ANAEROBIC Blood Culture adequate volume   Culture   Final    NO GROWTH < 12 HOURS Performed at [facility], [address] [place], [place] [address]    Report Status PENDING  Incomplete  Culture, blood (Routine X 2) w Reflex to ID Panel     Status: None (Preliminary result)   Collection Time: 06/05/23  1:29 PM   Specimen: BLOOD RIGHT HAND  Result Value Ref Range Status   Specimen Description BLOOD RIGHT HAND  Final   Special Requests   Final    BOTTLES DRAWN AEROBIC AND ANAEROBIC Blood Culture results may not be optimal due to an inadequate volume of blood received in culture bottles   Culture   Final    NO GROWTH < 24 HOURS Performed at [facility], [address], [place], [place] [address]    Report Status PENDING  Incomplete  Culture, blood (Routine X 2) w Reflex to ID Panel     Status: None (Preliminary result)   Collection Time: 06/06/23  2:34 AM   Specimen: BLOOD  Result Value Ref Range Status   Specimen Description BLOOD BLOOD RIGHT ARM  Final   Special Requests   Final    BOTTLES DRAWN AEROBIC AND ANAEROBIC Blood Culture adequate volume   Culture   Final    NO GROWTH < 12 HOURS Performed at [facility], [address], [place], [place] [address]    Report Status PENDING  Incomplete  Culture, blood (Routine X 2) w  Reflex to ID Panel     Status: None (Preliminary result)   Collection Time: 06/06/23  2:36 AM   Specimen: BLOOD  Result Value Ref Range Status   Specimen Description BLOOD BLOOD RIGHT HAND  Final   Special Requests   Final    BOTTLES DRAWN AEROBIC AND ANAEROBIC Blood Culture adequate volume  Culture   Final    NO GROWTH < 12 HOURS Performed at [facility], [address], [place], [place] [address]    Report Status PENDING  Incomplete     Labs: BNP (last 3 results) Recent Labs    02/22/23 0636 02/23/23 0717  BNP 91.3 77.0   Basic Metabolic Panel: Recent Labs  Lab 05/31/23 0252 06/05/23 1226 06/06/23 0236  NA 132* 134* 132*  K 3.9 4.4 4.1  CL 101 105 103  CO2 24 19* 19*  GLUCOSE 290* 233* 180*  BUN 14 20 15   CREATININE 0.61 0.74 0.67  CALCIUM 8.1* 8.5* 8.3*   Liver Function Tests: Recent Labs  Lab 05/31/23 0252 06/05/23 1226 06/06/23 0236  AST 21 25 22   ALT 21 25 21   ALKPHOS 90 103 86  BILITOT 0.3 0.4 0.3  PROT 5.4* 6.3* 5.4*  ALBUMIN 2.3* 2.8* 2.4*   Recent Labs  Lab 06/05/23 1226  LIPASE 36   No results for input(s): "AMMONIA" in the last 168 hours. CBC: Recent Labs  Lab 05/31/23 0252 06/05/23 1226 06/06/23 0236  WBC 10.7* 16.0* 14.5*  NEUTROABS  --  12.7*  --   HGB 9.6* 11.0* 9.7*  HCT 31.2* 36.3* 32.0*  MCV 86.2 89.9 88.2  PLT 321 384 361   Cardiac Enzymes: No results for input(s): "CKTOTAL", "CKMB", "CKMBINDEX", "TROPONINI" in the last 168 hours. BNP: Invalid input(s): "POCBNP" CBG: Recent Labs  Lab 05/31/23 1130 05/31/23 1702 05/31/23 2023 06/05/23 2126 06/06/23 0839  GLUCAP 173* 231* 270* 126* 107*   D-Dimer No results for input(s): "DDIMER" in the last 72 hours. Hgb A1c Recent Labs    06/06/23 0236  HGBA1C 7.5*   Lipid Profile No results for input(s): "CHOL", "HDL", "LDLCALC", "TRIG", "CHOLHDL", "LDLDIRECT" in the last 72 hours. Thyroid function studies No results for input(s): "TSH", "T4TOTAL", "T3FREE",  "THYROIDAB" in the last 72 hours.  Invalid input(s): "FREET3" Anemia work up No results for input(s): "VITAMINB12", "FOLATE", "FERRITIN", "TIBC", "IRON", "RETICCTPCT" in the last 72 hours. Urinalysis    Component Value Date/Time   COLORURINE YELLOW 06/05/2023 1843   APPEARANCEUR CLEAR 06/05/2023 1843   LABSPEC 1.032 (H) 06/05/2023 1843   PHURINE 5.0 06/05/2023 1843   GLUCOSEU 50 (A) 06/05/2023 1843   HGBUR NEGATIVE 06/05/2023 1843   BILIRUBINUR NEGATIVE 06/05/2023 1843   BILIRUBINUR n 03/18/2015 0928   KETONESUR NEGATIVE 06/05/2023 1843   PROTEINUR 30 (A) 06/05/2023 1843   UROBILINOGEN 2.0 03/18/2015 0928   UROBILINOGEN 1.0 07/16/2012 1345   NITRITE NEGATIVE 06/05/2023 1843   LEUKOCYTESUR SMALL (A) 06/05/2023 1843   Sepsis Labs Recent Labs  Lab 05/31/23 0252 06/05/23 1226 06/06/23 0236  WBC 10.7* 16.0* 14.5*   Microbiology Recent Results (from the past 240 hour(s))  Culture, blood (Routine X 2) w Reflex to ID Panel     Status: None (Preliminary result)   Collection Time: 06/05/23  1:24 PM   Specimen: BLOOD  Result Value Ref Range Status   Specimen Description BLOOD RIGHT ANTECUBITAL  Final   Special Requests   Final    BOTTLES DRAWN AEROBIC AND ANAEROBIC Blood Culture adequate volume   Culture   Final    NO GROWTH < 12 HOURS Performed at [facility], [address] [place], [place] [address]    Report Status PENDING  Incomplete  Culture, blood (Routine X 2) w Reflex to ID Panel     Status: None (Preliminary result)   Collection Time: 06/05/23  1:29 PM  Specimen: BLOOD RIGHT HAND  Result Value Ref Range Status   Specimen Description BLOOD RIGHT HAND  Final   Special Requests   Final    BOTTLES DRAWN AEROBIC AND ANAEROBIC Blood Culture results may not be optimal due to an inadequate volume of blood received in culture bottles   Culture   Final    NO GROWTH < 24 HOURS Performed at [facility], [address], [place], [place] [address]    Report  Status PENDING  Incomplete  Culture, blood (Routine X 2) w Reflex to ID Panel     Status: None (Preliminary result)   Collection Time: 06/06/23  2:34 AM   Specimen: BLOOD  Result Value Ref Range Status   Specimen Description BLOOD BLOOD RIGHT ARM  Final   Special Requests   Final    BOTTLES DRAWN AEROBIC AND ANAEROBIC Blood Culture adequate volume   Culture   Final    NO GROWTH < 12 HOURS Performed at [facility], [address], [place], [place] [address]    Report Status PENDING  Incomplete  Culture, blood (Routine X 2) w Reflex to ID Panel     Status: None (Preliminary result)   Collection Time: 06/06/23  2:36 AM   Specimen: BLOOD  Result Value Ref Range Status   Specimen Description BLOOD BLOOD RIGHT HAND  Final   Special Requests   Final    BOTTLES DRAWN AEROBIC AND ANAEROBIC Blood Culture adequate volume   Culture   Final    NO GROWTH < 12 HOURS Performed at [facility], [address] [place], [place] [address]    Report Status PENDING  Incomplete     Time coordinating discharge:  I have spent 35 minutes face to face with the patient and on the ward discussing the patients care, assessment, plan and disposition with other care givers. >50% of the time was devoted counseling the patient about the risks and benefits of treatment/Discharge disposition and coordinating care.   SIGNED:   Miguel Rota, MD  [facility] 06/06/2023, 12:43 PM   If 7PM-7AM, please contact night-coverage

## 2023-06-06 NOTE — Care Management CC44 (Signed)
Condition Code 44 Documentation Completed  Patient Details  Name: Brent [name]. MRN: [id number] Date of Birth: 10-13-1945   Condition Code 44 given:  Yes Patient signature on Condition Code 44 notice:  Yes Documentation of 2 MD's agreement:  Yes Code 44 added to claim:  Yes    Lawerance Sabal, RN 06/06/2023, 2:17 PM

## 2023-06-06 NOTE — Plan of Care (Signed)
Patient alert/oriented X4. Patient compliant with medication administration and administered oxycodone as needed for pain. Patient staple removed in LUQ wound prior to discharge. AVS instructions explained to patient in detail and PIV removed prior to discharge. Report called to [facility] rehab to [place], RN at [phone]   Problem: Education: Goal: Ability to describe self-care measures that may prevent or decrease complications (Diabetes Survival Skills Education) will improve Outcome: Adequate for Discharge   Problem: Education: Goal: Individualized Educational Video(s) Outcome: Adequate for Discharge   Problem: Coping: Goal: Ability to adjust to condition or change in health will improve Outcome: Adequate for Discharge   Problem: Fluid Volume: Goal: Ability to maintain a balanced intake and output will improve Outcome: Adequate for Discharge   Problem: Health Behavior/Discharge Planning: Goal: Ability to identify and utilize available resources and services will improve Outcome: Adequate for Discharge   Problem: Health Behavior/Discharge Planning: Goal: Ability to manage health-related needs will improve Outcome: Adequate for Discharge   Problem: Metabolic: Goal: Ability to maintain appropriate glucose levels will improve Outcome: Adequate for Discharge   Problem: Nutritional: Goal: Maintenance of adequate nutrition will improve Outcome: Adequate for Discharge   Problem: Nutritional: Goal: Progress toward achieving an optimal weight will improve Outcome: Adequate for Discharge   Problem: Skin Integrity: Goal: Risk for impaired skin integrity will decrease Outcome: Adequate for Discharge   Problem: Tissue Perfusion: Goal: Adequacy of tissue perfusion will improve Outcome: Adequate for Discharge   Problem: Education: Goal: Knowledge of General Education information will improve Description: Including pain rating scale, medication(s)/side effects and non-pharmacologic  comfort measures Outcome: Adequate for Discharge   Problem: Health Behavior/Discharge Planning: Goal: Ability to manage health-related needs will improve Outcome: Adequate for Discharge   Problem: Clinical Measurements: Goal: Ability to maintain clinical measurements within normal limits will improve Outcome: Adequate for Discharge   Problem: Clinical Measurements: Goal: Will remain free from infection Outcome: Adequate for Discharge   Problem: Clinical Measurements: Goal: Diagnostic test results will improve Outcome: Adequate for Discharge   Problem: Clinical Measurements: Goal: Respiratory complications will improve Outcome: Adequate for Discharge   Problem: Clinical Measurements: Goal: Cardiovascular complication will be avoided Outcome: Adequate for Discharge   Problem: Activity: Goal: Risk for activity intolerance will decrease Outcome: Adequate for Discharge   Problem: Nutrition: Goal: Adequate nutrition will be maintained Outcome: Adequate for Discharge   Problem: Coping: Goal: Level of anxiety will decrease Outcome: Adequate for Discharge   Problem: Elimination: Goal: Will not experience complications related to bowel motility Outcome: Adequate for Discharge   Problem: Elimination: Goal: Will not experience complications related to urinary retention Outcome: Adequate for Discharge   Problem: Pain Managment: Goal: General experience of comfort will improve Outcome: Adequate for Discharge   Problem: Safety: Goal: Ability to remain free from injury will improve Outcome: Adequate for Discharge   Problem: Skin Integrity: Goal: Risk for impaired skin integrity will decrease Outcome: Adequate for Discharge

## 2023-06-06 NOTE — TOC Transition Note (Signed)
Transition of Care [facility]) - CM/SW Discharge Note   Patient Details  Name: Brent [name]. MRN: [id number] Date of Birth: [birth year]  [facility] CM/SW Contact:  Amna Welker A [place], LCSWA Phone Number: 06/06/2023, 3:12 PM   Clinical Narrative:      Patient will DC to: [facility]  Anticipated DC date: 06/06/23  Family notified: Kathi Der  Transport by: Sharin Mons      Per MD patient ready for DC to [facility] . RN, patient, patient's family, and facility notified of DC. Discharge Summary and FL2 sent to facility. RN to call report prior to discharge ( [phone], [phone]). DC packet on chart. Ambulance transport requested for patient.     CSW will sign off for now as social work intervention is no longer needed. Please consult [place] again if new needs arise.  Final next level of care: Skilled Nursing Facility Barriers to Discharge: Barriers Resolved   Patient Goals and CMS Choice      Discharge Placement                Patient chooses bed at: [facility] Patient to be transferred to facility by: [facility] Name of family member notified: Brion Pruet Patient and family notified of of transfer: 06/06/23  Discharge Plan and Services Additional resources added to the After Visit Summary for                                       Social Determinants of Health (SDOH) Interventions SDOH Screenings   Food Insecurity: No Food Insecurity (06/06/2023)  Housing: Low Risk  (06/06/2023)  Transportation Needs: No Transportation Needs (06/06/2023)  Utilities: Not At Risk (05/21/2023)  Depression (PHQ2-9): Low Risk  (12/09/2020)  Financial Resource Strain: Low Risk  (03/06/2020)  Physical Activity: Inactive (12/14/2018)  Social Connections: Unknown (12/14/2018)  Stress: No Stress Concern Present (12/14/2018)  Tobacco Use: Medium Risk (06/05/2023)     Readmission Risk Interventions     No data to display

## 2023-06-06 NOTE — Progress Notes (Addendum)
[facility] Progress Note     Subjective: CC:  Patient known to our service s/p GC fistula takedown 9/4 by Dr. Dwain Sarna  He returned overnight from his nursing facility due to concern for wound infection. On admission he had a leukocytosis of 16.  General surgery is consulted to check his surgical sites.   The patient tells me that he is eating well and having ostomy output. He denies pain. He tells me that the nurses at the faciliry were changing his dressings daily and when they changed them yesterday they told him it was infected.  Objective: Vital signs in last 24 hours: Temp:  [97.5 F (36.4 C)-99.1 F (37.3 C)] 98.3 F (36.8 C) (09/17 0738) Pulse Rate:  [58-66] 58 (09/17 0738) Resp:  [12-18] 16 (09/17 0610) BP: (111-135)/(42-55) 130/48 (09/17 0738) SpO2:  [95 %-100 %] 100 % (09/17 0738) Last BM Date : 06/06/23  Intake/Output from previous day: 09/16 0701 - 09/17 0700 In: 100.6 [IV Piggyback:100.6] Out: 1350 [Urine:1350] Intake/Output this shift: Total I/O In: -  Out: 10 [Stool:10]  PE: Gen:  Alert, NAD, pleasant Card:  Regular rate and rhythm Pulm:  Normal effort ORA Abd: Soft, non-tender, non-distended, ostomy productive of stool LUQ incision at previous GC fistula site -- <0.5 cm pink erythema around site (present last admission, improved compared to prior), periwound is soft, packing removed, some purulence on packing, fascia in tact, one staple left apex of wound RUQ incision (previous port site infection) -- iodoform packing removed, purulence on bandage, no cellulitis. Umbilical port site -- interval decrease in erythema around the wound, there is purulence draining from the inferior aspect of the wound when I apply gentle pressure. Stich removed. Wound partially opened. Purulence expressed.   Skin: warm and dry, no rashes  Psych: A&Ox3   Lab Results:  Recent Labs    06/05/23 1226 06/06/23 0236  WBC 16.0* 14.5*  HGB 11.0* 9.7*  HCT 36.3*  32.0*  PLT 384 361   BMET Recent Labs    06/05/23 1226 06/06/23 0236  NA 134* 132*  K 4.4 4.1  CL 105 103  CO2 19* 19*  GLUCOSE 233* 180*  BUN 20 15  CREATININE 0.74 0.67  CALCIUM 8.5* 8.3*   PT/INR No results for input(s): "LABPROT", "INR" in the last 72 hours. CMP     Component Value Date/Time   NA 132 (L) 06/06/2023 0236   NA 140 04/11/2019 1028   K 4.1 06/06/2023 0236   CL 103 06/06/2023 0236   CO2 19 (L) 06/06/2023 0236   GLUCOSE 180 (H) 06/06/2023 0236   BUN 15 06/06/2023 0236   BUN 21 04/11/2019 1028   CREATININE 0.67 06/06/2023 0236   CREATININE 0.26 (L) 07/23/2020 1052   CALCIUM 8.3 (L) 06/06/2023 0236   PROT 5.4 (L) 06/06/2023 0236   PROT 6.6 01/20/2020 0754   ALBUMIN 2.4 (L) 06/06/2023 0236   ALBUMIN 4.2 03/10/2020 1757   AST 22 06/06/2023 0236   ALT 21 06/06/2023 0236   ALKPHOS 86 06/06/2023 0236   BILITOT 0.3 06/06/2023 0236   BILITOT 0.3 01/20/2020 0754   GFRNONAA >60 06/06/2023 0236   GFRNONAA 87 04/15/2016 1133   GFRAA >60 06/10/2020 0431   GFRAA >89 04/15/2016 1133   Lipase     Component Value Date/Time   LIPASE 36 06/05/2023 1226       Studies/Results: CT ABDOMEN PELVIS W CONTRAST  Result Date: 06/05/2023 CLINICAL DATA:  Nonlocalized abdominal pain. Wound infection. History of gastric  cutaneous fistula. EXAM: CT ABDOMEN AND PELVIS WITH CONTRAST TECHNIQUE: Multidetector CT imaging of the abdomen and pelvis was performed using the standard protocol following bolus administration of intravenous contrast. RADIATION DOSE REDUCTION: This exam was performed according to the departmental dose-optimization program which includes automated exposure control, adjustment of the mA and/or kV according to patient size and/or use of iterative reconstruction technique. CONTRAST:  75mL OMNIPAQUE IOHEXOL 350 MG/ML SOLN COMPARISON:  CT 05/21/2023 FINDINGS: Lower chest: Stable tiny pleural effusions. Trace pericardial fluid. Coronary artery calcifications are  seen. Gynecomastia. There is some linear opacity seen at the bases likely scar or atelectasis. Breathing motion. Hepatobiliary: Diffuse fatty liver infiltration. No space-occupying liver lesion. Previous cholecystectomy. Patent portal vein. Pancreas: Unremarkable. No pancreatic ductal dilatation or surrounding inflammatory changes. Spleen: Normal in size without focal abnormality. Adrenals/Urinary Tract: Adrenal glands are preserved. Once again there are some benign bilateral renal cystic foci. Largest on the left medially measures diameter of up to 3.5 cm. Hounsfield unit of 0. No imaging follow-up. There is also a rounded dense calcified structure along the upper aspect of the left kidney which is stable and again likely a benign lesion. Previously this has felt to represent retained contrast with some parenchymal atrophy. Please correlate with the history. The ureters have normal course and caliber extending down to the urinary bladder. Suprapubic catheter in the decompressed urinary bladder. Similar to previous. Stomach/Bowel: On this non oral contrast exam, once again there is sigmoid colon stool moderate stool with diverticula. Once again there is a left lower quadrant ostomy for the colon. Both diverting ostomy and mucous fistula in the ostomy site. More proximally the large bowel is nondilated. Scattered stool. Normal appendix in the right lower quadrant posterior to the cecum. Moderate debris in the stomach. Surgical changes along the anterior gastric wall at the antrum is new from previous. The previous fistula to the stomach is no longer seen. Please correlate for interval surgical repair. The small bowel is nondilated. Vascular/Lymphatic: Aortic atherosclerosis. No enlarged abdominal or pelvic lymph nodes. Reproductive: Prostate is unremarkable. Other: Anasarca. Presumed surgical changes along the upper abdomen with some scattered ill-defined fluid anterior to the stomach. No clear rim enhancing fluid  collections or significant extraluminal gas. There are 2 focal anterior abdominal wall subcutaneous fat and skin defects identified 1 in the right upper quadrant on axial series 3, image 38. The other anterior to the antrum of the stomach. There is also some air and fluid in the area of the umbilicus. Musculoskeletal: Once again osteopenia with the advanced degenerative changes along the spine and pelvis. Multilevel stenosis along the spine. Bridging osteophytes identified. Soft tissue abnormality of the right ischial region with extension down to bone with sclerosis. Unchanged from previous. Please correlate for a chronic decubitus ulcer. There also truncation of the lower sacrum and coccyx which is stable. 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 Some ill-defined fluid in the anterior upper abdomen. No clear rim enhancing fluid collection or significant free air. No signs of bowel obstruction. The focal soft tissue defect along the anterior abdominal wall, 1 on each side in the upper abdomen may be postsurgical as well. Please correlate with clinical findings. 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 Colonic diverticulosis. Suprapubic catheter. Again changes of known previous decubitus ulcers in the pelvis. Stable tiny pleural effusions. Electronically Signed   By: Karen Kays M.D.   On: 06/05/2023 16:49    Anti-infectives: Anti-infectives (From admission, onward)  Start     Dose/Rate Route Frequency Ordered Stop   06/06/23 0400  ampicillin-sulbactam (UNASYN) 1.5 g in sodium chloride 0.9 % 100 mL IVPB  Status:  Discontinued        1.5 g 200 mL/hr over 30 Minutes Intravenous Every 8 hours 06/05/23 2131 06/05/23 2132   06/06/23 0400  Ampicillin-Sulbactam (UNASYN) 3 g in sodium chloride 0.9 % 100 mL IVPB  Status:  Discontinued        3 g 200 mL/hr over 30 Minutes Intravenous Every 6 hours 06/05/23 2132 06/05/23 2149   06/06/23 0400  ampicillin-sulbactam  (UNASYN) 1.5 g in sodium chloride 0.9 % 100 mL IVPB        1.5 g 200 mL/hr over 30 Minutes Intravenous Every 6 hours 06/05/23 2149 06/12/23 0359   06/05/23 2115  ampicillin-sulbactam (UNASYN) 1.5 g in sodium chloride 0.9 % 100 mL IVPB  Status:  Discontinued        1.5 g 200 mL/hr over 30 Minutes Intravenous Every 8 hours 06/05/23 2100 06/05/23 2131   06/05/23 2000  Ampicillin-Sulbactam (UNASYN) 3 g in sodium chloride 0.9 % 100 mL IVPB        3 g 200 mL/hr over 30 Minutes Intravenous  Once 06/05/23 1950 06/05/23 2102        Assessment/Plan  POD#13 s/p Diagnostic laparoscopy with lysis of adhesions, gastric wedge resection of gastrocutaneous fistula 9/4 Dr. Dwain Sarna - re-admitted overnight due to concern for wound infection. CT abd/pelvis showed small, ill defined fluid in the anterior upper abdomen. This was not appreciated on the radiologists read but it does look like there is some fluid in the anterior abdominal wall around the umbilical port site. This is consistent with the purulence I encountered on physical exam.   While all surgical sites do have purulent drainage, they are all open and draining. Abdominal wall cellulitis has almost completely resolved. He already completed one week of PO augmentin for this. I do not recommend any further antibiotic treatment for this, and I am not sure that the purulence I drained from his umbilical site would explain his leukocytosis of 16. Defer any other source of infection to primary team. [facility] for discharge. Continue changing the dressings to his abdominal wall 1-2 times daily with iodoform packing. Discussed with bedside RN - she will remove staple from LUQ wound prior to D/C.      LOS: 0 days   I reviewed nursing notes, ED provider notes, hospitalist notes, last 24 h vitals and pain scores, last 48 h intake and output, last 24 h labs and trends, and last 24 h imaging results.    Hosie Spangle, PA-C [facility] Please see  Amion for pager number during day hours 7:00am-4:30pm

## 2023-06-06 NOTE — Care Management Obs Status (Signed)
MEDICARE OBSERVATION STATUS NOTIFICATION   Patient Details  Name: Brent [name]. MRN: [id number] Date of Birth: [birth year]   Medicare Observation Status Notification Given:  Yes    Lawerance Sabal, RN 06/06/2023, 2:16 PM

## 2023-06-10 LAB — CULTURE, BLOOD (ROUTINE X 2)
Culture: NO GROWTH
Culture: NO GROWTH
Special Requests: ADEQUATE

## 2023-06-11 LAB — CULTURE, BLOOD (ROUTINE X 2)
Culture: NO GROWTH
Culture: NO GROWTH
Special Requests: ADEQUATE
Special Requests: ADEQUATE

## 2023-06-29 ENCOUNTER — Telehealth: Payer: Self-pay | Admitting: Gastroenterology

## 2023-06-29 NOTE — Telephone Encounter (Signed)
This is a Dr Myrtie Neither pt- Dr Meridee Score only did the EUS will route accordingly

## 2023-06-29 NOTE — Telephone Encounter (Signed)
Brent [name] from [facility] called to get an urgent appt for the patent she stated they were treating a wound and he has a colostomy bag and now he has puss coming out of his rectum. Please advise.

## 2023-06-29 NOTE — Telephone Encounter (Signed)
Attempted to reach [facility] and was transferred but no one ever picked up the line

## 2023-06-30 ENCOUNTER — Emergency Department (HOSPITAL_COMMUNITY): Payer: Medicare Other

## 2023-06-30 ENCOUNTER — Observation Stay (HOSPITAL_COMMUNITY): Payer: Medicare Other

## 2023-06-30 ENCOUNTER — Encounter (HOSPITAL_COMMUNITY): Payer: Self-pay | Admitting: *Deleted

## 2023-06-30 ENCOUNTER — Other Ambulatory Visit: Payer: Self-pay

## 2023-06-30 ENCOUNTER — Inpatient Hospital Stay (HOSPITAL_COMMUNITY)
Admission: EM | Admit: 2023-06-30 | Discharge: 2023-07-06 | DRG: 616 | Disposition: A | Discharge status: Skilled Nursing Facility | Payer: Medicare Other | Source: Skilled Nursing Facility | Attending: Internal Medicine | Admitting: Internal Medicine

## 2023-06-30 DIAGNOSIS — Z79899 Other long term (current) drug therapy: Secondary | ICD-10-CM

## 2023-06-30 DIAGNOSIS — Z7984 Long term (current) use of oral hypoglycemic drugs: Secondary | ICD-10-CM

## 2023-06-30 DIAGNOSIS — L8994 Pressure ulcer of unspecified site, stage 4: Secondary | ICD-10-CM | POA: Diagnosis present

## 2023-06-30 DIAGNOSIS — G8250 Quadriplegia, unspecified: Secondary | ICD-10-CM | POA: Diagnosis present

## 2023-06-30 DIAGNOSIS — Z85820 Personal history of malignant melanoma of skin: Secondary | ICD-10-CM

## 2023-06-30 DIAGNOSIS — E781 Pure hyperglyceridemia: Secondary | ICD-10-CM | POA: Diagnosis present

## 2023-06-30 DIAGNOSIS — L089 Local infection of the skin and subcutaneous tissue, unspecified: Principal | ICD-10-CM

## 2023-06-30 DIAGNOSIS — E119 Type 2 diabetes mellitus without complications: Secondary | ICD-10-CM

## 2023-06-30 DIAGNOSIS — L97519 Non-pressure chronic ulcer of other part of right foot with unspecified severity: Secondary | ICD-10-CM | POA: Diagnosis present

## 2023-06-30 DIAGNOSIS — I700 Atherosclerosis of aorta: Secondary | ICD-10-CM | POA: Insufficient documentation

## 2023-06-30 DIAGNOSIS — E11621 Type 2 diabetes mellitus with foot ulcer: Secondary | ICD-10-CM | POA: Insufficient documentation

## 2023-06-30 DIAGNOSIS — J9811 Atelectasis: Secondary | ICD-10-CM | POA: Diagnosis not present

## 2023-06-30 DIAGNOSIS — Z881 Allergy status to other antibiotic agents status: Secondary | ICD-10-CM

## 2023-06-30 DIAGNOSIS — T148XXA Other injury of unspecified body region, initial encounter: Secondary | ICD-10-CM | POA: Diagnosis not present

## 2023-06-30 DIAGNOSIS — E876 Hypokalemia: Secondary | ICD-10-CM | POA: Diagnosis not present

## 2023-06-30 DIAGNOSIS — Z955 Presence of coronary angioplasty implant and graft: Secondary | ICD-10-CM

## 2023-06-30 DIAGNOSIS — I2510 Atherosclerotic heart disease of native coronary artery without angina pectoris: Secondary | ICD-10-CM | POA: Diagnosis present

## 2023-06-30 DIAGNOSIS — E872 Acidosis, unspecified: Secondary | ICD-10-CM | POA: Diagnosis not present

## 2023-06-30 DIAGNOSIS — Z9049 Acquired absence of other specified parts of digestive tract: Secondary | ICD-10-CM

## 2023-06-30 DIAGNOSIS — G8220 Paraplegia, unspecified: Secondary | ICD-10-CM | POA: Diagnosis present

## 2023-06-30 DIAGNOSIS — N319 Neuromuscular dysfunction of bladder, unspecified: Secondary | ICD-10-CM | POA: Diagnosis present

## 2023-06-30 DIAGNOSIS — D631 Anemia in chronic kidney disease: Secondary | ICD-10-CM | POA: Diagnosis present

## 2023-06-30 DIAGNOSIS — Z8249 Family history of ischemic heart disease and other diseases of the circulatory system: Secondary | ICD-10-CM

## 2023-06-30 DIAGNOSIS — L89316 Pressure-induced deep tissue damage of right buttock: Secondary | ICD-10-CM | POA: Diagnosis present

## 2023-06-30 DIAGNOSIS — Z794 Long term (current) use of insulin: Secondary | ICD-10-CM

## 2023-06-30 DIAGNOSIS — Z801 Family history of malignant neoplasm of trachea, bronchus and lung: Secondary | ICD-10-CM

## 2023-06-30 DIAGNOSIS — Z9359 Other cystostomy status: Secondary | ICD-10-CM

## 2023-06-30 DIAGNOSIS — L89154 Pressure ulcer of sacral region, stage 4: Secondary | ICD-10-CM | POA: Diagnosis present

## 2023-06-30 DIAGNOSIS — Z825 Family history of asthma and other chronic lower respiratory diseases: Secondary | ICD-10-CM

## 2023-06-30 DIAGNOSIS — Z888 Allergy status to other drugs, medicaments and biological substances status: Secondary | ICD-10-CM

## 2023-06-30 DIAGNOSIS — M86171 Other acute osteomyelitis, right ankle and foot: Secondary | ICD-10-CM | POA: Insufficient documentation

## 2023-06-30 DIAGNOSIS — M00871 Arthritis due to other bacteria, right ankle and foot: Secondary | ICD-10-CM | POA: Diagnosis present

## 2023-06-30 DIAGNOSIS — Z800 Family history of malignant neoplasm of digestive organs: Secondary | ICD-10-CM

## 2023-06-30 DIAGNOSIS — Z87891 Personal history of nicotine dependence: Secondary | ICD-10-CM

## 2023-06-30 DIAGNOSIS — M009 Pyogenic arthritis, unspecified: Secondary | ICD-10-CM | POA: Insufficient documentation

## 2023-06-30 DIAGNOSIS — E871 Hypo-osmolality and hyponatremia: Secondary | ICD-10-CM | POA: Diagnosis not present

## 2023-06-30 DIAGNOSIS — Z933 Colostomy status: Secondary | ICD-10-CM

## 2023-06-30 DIAGNOSIS — L89326 Pressure-induced deep tissue damage of left buttock: Secondary | ICD-10-CM | POA: Diagnosis present

## 2023-06-30 DIAGNOSIS — I10 Essential (primary) hypertension: Secondary | ICD-10-CM | POA: Diagnosis present

## 2023-06-30 DIAGNOSIS — I252 Old myocardial infarction: Secondary | ICD-10-CM

## 2023-06-30 DIAGNOSIS — L98423 Non-pressure chronic ulcer of back with necrosis of muscle: Secondary | ICD-10-CM

## 2023-06-30 DIAGNOSIS — M869 Osteomyelitis, unspecified: Secondary | ICD-10-CM

## 2023-06-30 DIAGNOSIS — L89893 Pressure ulcer of other site, stage 3: Secondary | ICD-10-CM | POA: Diagnosis present

## 2023-06-30 DIAGNOSIS — G373 Acute transverse myelitis in demyelinating disease of central nervous system: Secondary | ICD-10-CM | POA: Diagnosis present

## 2023-06-30 DIAGNOSIS — L8990 Pressure ulcer of unspecified site, unspecified stage: Secondary | ICD-10-CM | POA: Diagnosis present

## 2023-06-30 DIAGNOSIS — R82998 Other abnormal findings in urine: Secondary | ICD-10-CM

## 2023-06-30 DIAGNOSIS — G4733 Obstructive sleep apnea (adult) (pediatric): Secondary | ICD-10-CM | POA: Diagnosis present

## 2023-06-30 DIAGNOSIS — M879 Osteonecrosis, unspecified: Secondary | ICD-10-CM | POA: Diagnosis present

## 2023-06-30 DIAGNOSIS — E1169 Type 2 diabetes mellitus with other specified complication: Principal | ICD-10-CM | POA: Diagnosis present

## 2023-06-30 DIAGNOSIS — Z880 Allergy status to penicillin: Secondary | ICD-10-CM

## 2023-06-30 HISTORY — DX: Pressure ulcer of unspecified site, stage 4: L89.94

## 2023-06-30 LAB — I-STAT CG4 LACTIC ACID, ED
Lactic Acid, Venous: 2.5 mmol/L (ref 0.5–1.9)
Lactic Acid, Venous: 2.2 mmol/L (ref 0.5–1.9)
Lactic Acid, Venous: 2.3 mmol/L (ref 0.5–1.9)

## 2023-06-30 LAB — URINALYSIS, ROUTINE W REFLEX MICROSCOPIC
Bilirubin Urine: NEGATIVE
Glucose, UA: NEGATIVE mg/dL
Hgb urine dipstick: NEGATIVE
Ketones, ur: NEGATIVE mg/dL
Nitrite: NEGATIVE
Protein, ur: 30 mg/dL — AB
Specific Gravity, Urine: 1.011 (ref 1.005–1.030)
WBC, UA: >50 WBC/hpf (ref 0–5)
pH: 5 (ref 5.0–8.0)

## 2023-06-30 LAB — CBC
HCT: 32.7 % — ABNORMAL LOW (ref 39.0–52.0)
HCT: 32.9 % — ABNORMAL LOW (ref 39.0–52.0)
Hemoglobin: 10 g/dL — ABNORMAL LOW (ref 13.0–17.0)
Hemoglobin: 10 g/dL — ABNORMAL LOW (ref 13.0–17.0)
MCH: 26.3 pg (ref 26.0–34.0)
MCH: 26.2 pg (ref 26.0–34.0)
MCHC: 30.6 g/dL (ref 30.0–36.0)
MCHC: 30.4 g/dL (ref 30.0–36.0)
MCV: 86.1 fL (ref 80.0–100.0)
MCV: 86.4 fL (ref 80.0–100.0)
Platelets: 333 10*3/uL (ref 150–400)
Platelets: 353 10*3/uL (ref 150–400)
RBC: 3.8 MIL/uL — ABNORMAL LOW (ref 4.22–5.81)
RBC: 3.81 MIL/uL — ABNORMAL LOW (ref 4.22–5.81)
RDW: 15.8 % — ABNORMAL HIGH (ref 11.5–15.5)
RDW: 15.9 % — ABNORMAL HIGH (ref 11.5–15.5)
WBC: 9.4 10*3/uL (ref 4.0–10.5)
WBC: 13.2 10*3/uL — ABNORMAL HIGH (ref 4.0–10.5)
nRBC: 0 % (ref 0.0–0.2)
nRBC: 0 % (ref 0.0–0.2)

## 2023-06-30 LAB — BASIC METABOLIC PANEL
Anion gap: 11 (ref 5–15)
BUN: 18 mg/dL (ref 8–23)
CO2: 23 mmol/L (ref 22–32)
Calcium: 8.5 mg/dL — ABNORMAL LOW (ref 8.9–10.3)
Chloride: 102 mmol/L (ref 98–111)
Creatinine, Ser: 0.71 mg/dL (ref 0.61–1.24)
GFR, Estimated: >60 mL/min (ref >60)
Glucose, Bld: 206 mg/dL — ABNORMAL HIGH (ref 70–99)
Potassium: 4 mmol/L (ref 3.5–5.1)
Sodium: 136 mmol/L (ref 135–145)

## 2023-06-30 LAB — GLUCOSE, CAPILLARY
Glucose-Capillary: 178 mg/dL — ABNORMAL HIGH (ref 70–99)
Glucose-Capillary: 171 mg/dL — ABNORMAL HIGH (ref 70–99)

## 2023-06-30 LAB — TROPONIN I (HIGH SENSITIVITY): Troponin I (High Sensitivity): 4 ng/L (ref <18)

## 2023-06-30 LAB — CREATININE, SERUM
Creatinine, Ser: 0.59 mg/dL — ABNORMAL LOW (ref 0.61–1.24)
GFR, Estimated: >60 mL/min (ref >60)

## 2023-06-30 MED ORDER — DULOXETINE HCL 30 MG PO CPEP
30.0000 mg | ORAL_CAPSULE | Freq: Every day | ORAL | Status: DC
  Administered 2023-06-30 – 2023-07-05 (×6): 30 mg via ORAL
  Filled 2023-06-30 (×6): qty 1

## 2023-06-30 MED ORDER — VITAMIN D 25 MCG (1000 UNIT) PO TABS
2000.0000 [IU] | ORAL_TABLET | Freq: Every day | ORAL | Status: DC
  Administered 2023-07-01 – 2023-07-06 (×4): 2000 [IU] via ORAL
  Filled 2023-06-30 (×5): qty 2

## 2023-06-30 MED ORDER — PANTOPRAZOLE SODIUM 40 MG PO TBEC
40.0000 mg | DELAYED_RELEASE_TABLET | Freq: Two times a day (BID) | ORAL | Status: DC
  Administered 2023-06-30 – 2023-07-06 (×10): 40 mg via ORAL
  Filled 2023-06-30 (×11): qty 1

## 2023-06-30 MED ORDER — ACETAMINOPHEN 325 MG PO TABS
650.0000 mg | ORAL_TABLET | Freq: Four times a day (QID) | ORAL | Status: DC | PRN
  Administered 2023-06-30: 650 mg via ORAL
  Filled 2023-06-30: qty 2

## 2023-06-30 MED ORDER — INSULIN GLARGINE-YFGN 100 UNIT/ML ~~LOC~~ SOLN
35.0000 [IU] | Freq: Two times a day (BID) | SUBCUTANEOUS | Status: DC
  Administered 2023-06-30 – 2023-07-06 (×11): 35 [IU] via SUBCUTANEOUS
  Filled 2023-06-30 (×14): qty 0.35

## 2023-06-30 MED ORDER — ATENOLOL 50 MG PO TABS
25.0000 mg | ORAL_TABLET | Freq: Every day | ORAL | Status: DC
  Administered 2023-06-30 – 2023-07-05 (×5): 25 mg via ORAL
  Filled 2023-06-30 (×6): qty 1

## 2023-06-30 MED ORDER — VITAMIN C 500 MG PO TABS
500.0000 mg | ORAL_TABLET | Freq: Every day | ORAL | Status: DC
  Administered 2023-07-01 – 2023-07-06 (×4): 500 mg via ORAL
  Filled 2023-06-30 (×5): qty 1

## 2023-06-30 MED ORDER — SENNOSIDES-DOCUSATE SODIUM 8.6-50 MG PO TABS
1.0000 | ORAL_TABLET | Freq: Two times a day (BID) | ORAL | Status: DC
  Administered 2023-06-30 – 2023-07-06 (×9): 1 via ORAL
  Filled 2023-06-30 (×11): qty 1

## 2023-06-30 MED ORDER — GERHARDT'S BUTT CREAM
TOPICAL_CREAM | Freq: Three times a day (TID) | CUTANEOUS | Status: DC
  Administered 2023-07-03: 1 via TOPICAL
  Filled 2023-06-30: qty 1

## 2023-06-30 MED ORDER — LACTATED RINGERS IV SOLN: INTRAVENOUS | Status: AC

## 2023-06-30 MED ORDER — POLYETHYL GLYCOL-PROPYL GLYCOL 0.4-0.3 % OP GEL: 1.0000 | Freq: Four times a day (QID) | OPHTHALMIC | Status: DC | PRN

## 2023-06-30 MED ORDER — ENOXAPARIN SODIUM 40 MG/0.4ML IJ SOSY
40.0000 mg | PREFILLED_SYRINGE | INTRAMUSCULAR | Status: DC
  Administered 2023-07-01 – 2023-07-06 (×5): 40 mg via SUBCUTANEOUS
  Filled 2023-06-30 (×6): qty 0.4

## 2023-06-30 MED ORDER — ACETAMINOPHEN 650 MG RE SUPP: 650.0000 mg | Freq: Four times a day (QID) | RECTAL | Status: DC | PRN

## 2023-06-30 MED ORDER — LAMOTRIGINE 25 MG PO TABS
50.0000 mg | ORAL_TABLET | Freq: Two times a day (BID) | ORAL | Status: DC
  Administered 2023-06-30 – 2023-07-06 (×10): 50 mg via ORAL
  Filled 2023-06-30 (×11): qty 2

## 2023-06-30 MED ORDER — FAMOTIDINE 20 MG PO TABS
20.0000 mg | ORAL_TABLET | Freq: Two times a day (BID) | ORAL | Status: DC
  Administered 2023-06-30 – 2023-07-06 (×10): 20 mg via ORAL
  Filled 2023-06-30 (×11): qty 1

## 2023-06-30 MED ORDER — ACETAMINOPHEN 325 MG PO TABS
650.0000 mg | ORAL_TABLET | Freq: Four times a day (QID) | ORAL | Status: DC | PRN
  Administered 2023-07-03 – 2023-07-04 (×4): 650 mg via ORAL
  Filled 2023-06-30 (×5): qty 2

## 2023-06-30 MED ORDER — TRAZODONE HCL 50 MG PO TABS
75.0000 mg | ORAL_TABLET | Freq: Every day | ORAL | Status: DC
  Administered 2023-06-30 – 2023-07-05 (×6): 75 mg via ORAL
  Filled 2023-06-30 (×6): qty 2

## 2023-06-30 MED ORDER — ONDANSETRON 4 MG PO TBDP
4.0000 mg | ORAL_TABLET | Freq: Three times a day (TID) | ORAL | Status: DC | PRN
  Filled 2023-06-30: qty 1

## 2023-06-30 MED ORDER — ATENOLOL 25 MG PO TABS
12.5000 mg | ORAL_TABLET | Freq: Every day | ORAL | Status: DC
  Administered 2023-07-01 – 2023-07-06 (×4): 12.5 mg via ORAL
  Filled 2023-06-30 (×6): qty 1

## 2023-06-30 MED ORDER — SODIUM CHLORIDE 0.9 % IV SOLN
1.0000 g | Freq: Once | INTRAVENOUS | Status: AC
  Administered 2023-06-30: 1 g via INTRAVENOUS
  Filled 2023-06-30: qty 10

## 2023-06-30 MED ORDER — POLYVINYL ALCOHOL 1.4 % OP SOLN: 1.0000 [drp] | Freq: Four times a day (QID) | OPHTHALMIC | Status: DC | PRN

## 2023-06-30 MED ORDER — VANCOMYCIN HCL 1500 MG/300ML IV SOLN
1500.0000 mg | Freq: Once | INTRAVENOUS | Status: AC
  Administered 2023-06-30: 1500 mg via INTRAVENOUS
  Filled 2023-06-30: qty 300

## 2023-06-30 MED ORDER — OXYCODONE HCL 5 MG PO TABS
5.0000 mg | ORAL_TABLET | Freq: Two times a day (BID) | ORAL | Status: DC | PRN
  Administered 2023-06-30 – 2023-07-04 (×6): 5 mg via ORAL
  Filled 2023-06-30 (×6): qty 1

## 2023-06-30 MED ORDER — SODIUM CHLORIDE 0.9 % IV SOLN
1.0000 g | INTRAVENOUS | Status: DC
  Administered 2023-07-01: 1 g via INTRAVENOUS
  Filled 2023-06-30: qty 10

## 2023-06-30 MED ORDER — SODIUM CHLORIDE 0.9% FLUSH
3.0000 mL | Freq: Two times a day (BID) | INTRAVENOUS | Status: DC
  Administered 2023-06-30 – 2023-07-06 (×3): 3 mL via INTRAVENOUS

## 2023-06-30 MED ORDER — ATENOLOL 25 MG PO TABS: 12.5000 mg | ORAL_TABLET | Freq: Two times a day (BID) | ORAL | Status: DC

## 2023-06-30 MED ORDER — INSULIN ASPART 100 UNIT/ML IJ SOLN
0.0000 [IU] | Freq: Every day | INTRAMUSCULAR | Status: DC
  Administered 2023-07-02 – 2023-07-03 (×2): 2 [IU] via SUBCUTANEOUS

## 2023-06-30 MED ORDER — GADOPENTETATE DIMEGLUMINE 469.01 MG/ML IV SOLN
10.0000 mL | Freq: Once | INTRAVENOUS | Status: AC | PRN
  Administered 2023-06-30: 10 mL via INTRAVENOUS

## 2023-06-30 MED ORDER — LORAZEPAM 0.5 MG PO TABS
0.5000 mg | ORAL_TABLET | Freq: Every day | ORAL | Status: DC | PRN
  Administered 2023-06-30 – 2023-07-05 (×6): 0.5 mg via ORAL
  Filled 2023-06-30 (×7): qty 1

## 2023-06-30 MED ORDER — INSULIN ASPART 100 UNIT/ML IJ SOLN
0.0000 [IU] | Freq: Three times a day (TID) | INTRAMUSCULAR | Status: DC
  Administered 2023-07-01 (×2): 3 [IU] via SUBCUTANEOUS
  Administered 2023-07-01: 5 [IU] via SUBCUTANEOUS
  Administered 2023-07-02: 3 [IU] via SUBCUTANEOUS
  Administered 2023-07-03: 8 [IU] via SUBCUTANEOUS
  Administered 2023-07-03 (×2): 3 [IU] via SUBCUTANEOUS
  Administered 2023-07-04 (×2): 2 [IU] via SUBCUTANEOUS
  Administered 2023-07-05: 3 [IU] via SUBCUTANEOUS
  Administered 2023-07-05: 5 [IU] via SUBCUTANEOUS
  Administered 2023-07-06: 2 [IU] via SUBCUTANEOUS
  Administered 2023-07-06: 5 [IU] via SUBCUTANEOUS
  Administered 2023-07-06: 2 [IU] via SUBCUTANEOUS

## 2023-06-30 MED ORDER — ATORVASTATIN CALCIUM 40 MG PO TABS
40.0000 mg | ORAL_TABLET | Freq: Every day | ORAL | Status: DC
  Administered 2023-06-30 – 2023-07-05 (×6): 40 mg via ORAL
  Filled 2023-06-30 (×6): qty 1

## 2023-06-30 MED ORDER — POLYETHYLENE GLYCOL 3350 17 G PO PACK: 17.0000 g | PACK | Freq: Every day | ORAL | Status: DC | PRN

## 2023-06-30 MED ORDER — IOHEXOL 300 MG/ML  SOLN
100.0000 mL | Freq: Once | INTRAMUSCULAR | Status: AC | PRN
  Administered 2023-06-30: 100 mL via INTRAVENOUS

## 2023-06-30 MED ORDER — BACLOFEN 20 MG PO TABS
20.0000 mg | ORAL_TABLET | Freq: Three times a day (TID) | ORAL | Status: DC
  Administered 2023-06-30 – 2023-07-06 (×16): 20 mg via ORAL
  Filled 2023-06-30: qty 2
  Filled 2023-06-30 (×6): qty 1
  Filled 2023-06-30 (×2): qty 2
  Filled 2023-06-30: qty 1
  Filled 2023-06-30: qty 2
  Filled 2023-06-30 (×6): qty 1

## 2023-06-30 MED ORDER — DOXYCYCLINE HYCLATE 100 MG PO TABS
100.0000 mg | ORAL_TABLET | Freq: Two times a day (BID) | ORAL | Status: AC
  Administered 2023-06-30 – 2023-07-01 (×2): 100 mg via ORAL
  Filled 2023-06-30 (×2): qty 1

## 2023-06-30 NOTE — ED Notes (Signed)
Patient transported to X-ray 

## 2023-06-30 NOTE — Telephone Encounter (Signed)
Spoke with Shanda Bumps from [facility] & she stated NP asked that patient be seen for an urgent appointment because she believes something is going on internally with patient. His sacral wound has increased in size & he has yellow puss coming out of his rectum. The soonest appointment available would be with a PA & that is next Friday 10/18. Princella Pellegrini that patient should be seen at ED for sooner evaluation. She verbalized all understanding & will make NP aware.

## 2023-06-30 NOTE — ED Notes (Signed)
ED TO INPATIENT HANDOFF REPORT  Name/Age/Gender Brent [name]. 77 y.o. male  Code Status    Code Status Orders  (From admission, onward)           Start     Ordered   06/30/23 1719  Full code  Continuous       Question:  By:  Answer:  Consent: discussion documented in EHR   06/30/23 1720           Code Status History     Date Active Date Inactive Code Status Order ID Comments User Context   06/05/2023 2100 06/06/2023 2020 Full Code [id number]  Tereasa Coop, MD ED   05/21/2023 1528 06/01/2023 0403 Full Code [id number]  Leatha Gilding, MD ED   02/21/2023 0914 02/25/2023 1652 Full Code [id number]  Clydie Braun, MD ED   02/21/2023 0523 02/21/2023 0914 Full Code [id number]  Howerter, Justin B, DO ED   03/25/2022 2125 03/27/2022 0112 Full Code [id number]  Hillary Bow, DO ED   08/06/2021 0432 08/10/2021 2207 Full Code [id number]  Briscoe Deutscher, MD ED   12/21/2020 2252 12/26/2020 1730 Full Code [id number]  John Giovanni, MD ED   11/16/2020 1333 11/22/2020 2059 DNR [id number]  Jae Dire, MD ED   11/16/2020 1137 11/16/2020 1333 DNR [id number]  Bethann Berkshire, MD ED   06/01/2020 1517 06/11/2020 0407 Full Code [id number]  Emeline General, MD ED   03/25/2020 1501 04/24/2020 2250 Full Code [id number]  Charlton Amor, PA-C Inpatient   03/25/2020 1501 03/25/2020 1501 Full Code [id number]  Charlton Amor, PA-C Inpatient   03/08/2020 1744 03/25/2020 1457 Full Code [id number]  Emeline General, MD ED      Advance Directive Documentation    Flowsheet Row Most Recent Value  Type of Advance Directive Out of facility DNR (pink MOST or yellow form)  Pre-existing out of facility DNR order (yellow form or pink MOST form) Pink Most/Yellow Form available - Physician notified to receive inpatient order  "MOST" Form in Place? --       Home/SNF/Other Skilled nursing facility  Chief Complaint Pressure ulcer [L89.90]  Level of Care/Admitting Diagnosis ED Disposition     ED Disposition  Admit    Condition  --   Comment  Hospital Area: [facility] [[id number]]  Level of Care: Med-Surg [16]  May place patient in observation at [facility] or [facility] if equivalent level of care is available:: Yes  Covid Evaluation: Asymptomatic - no recent exposure (last 10 days) testing not required  Diagnosis: Pressure ulcer [741504]  Admitting Physician: Nolberto Hanlon [[id number]]  Attending Physician: Nolberto Hanlon [[id number]]          Medical History Past Medical History:  Diagnosis Date   Allergy    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 angioplasty of his RCA in 1990. b. bare metal stent placed in the RCA in 2000 followed by rotational atherectomy shortly after for stent restenosis. c. last cath was in 2012 showing stable moderate diffuse CAD. (70% mid LAD, 80% diagonal, 70% Ramus, 40% mid to distal RCA stent restenosis). d. Low risk nuc in 2015.   Diabetes mellitus    Diverticulosis    Elevated CK    Erectile dysfunction    Hemorrhoids    HTN (hypertension)    Hyperlipidemia    Hypertriglyceridemia    Malignant melanoma of left side of neck (HCC) 10/25/2018  Myocardial infarction (HCC)    Obesity    OSA (obstructive sleep apnea)    Persistent disorder of initiating or maintaining sleep    Personal history of colonic polyps 02/05/2003   Renal lesion 06/20/2016    Allergies Allergies  Allergen Reactions   Cipro [Ciprofloxacin Hcl] Other (See Comments)    Hallucinations Allergy not listed on MAR   Neurontin [Gabapentin] Other (See Comments)    Unknown reaction   Levaquin [Levofloxacin] Rash   Penicillins Hives and Rash    Tolerated Unasyn, Augmentin, cefepime and cephalexin    IV Location/Drains/Wounds Patient Lines/Drains/Airways Status     Active Line/Drains/Airways     Name Placement date Placement time Site Days   Peripheral IV 06/30/23 20 G 1" Left Antecubital 06/30/23  1245  Antecubital  less than 1    Peripheral IV 06/30/23 20 G Posterior;Right Forearm 06/30/23  1250  Forearm  less than 1   Colostomy LLQ --  --  LLQ  --   Suprapubic Catheter Latex 20 Fr. 02/22/23  1550  Latex  128   Incision - 4 Ports Abdomen 1: Right 2: Umbilicus 3: Left 4: Left;Lateral 05/24/23  1416  -- 37   Pressure Injury 02/22/23 02/22/23  0800  -- 128   Pressure Injury 05/21/23 Buttocks Bilateral Stage 3 -  Full thickness tissue loss. Subcutaneous fat may be visible but bone, tendon or muscle are NOT exposed. pink stage 3 on sacral area with areas of yellow slough 05/21/23  1651  -- 40   Pressure Injury 05/21/23 Thigh Posterior;Proximal;Right Stage 3 -  Full thickness tissue loss. Subcutaneous fat may be visible but bone, tendon or muscle are NOT exposed. pink  area with white/yellow center 05/21/23  1653  -- 40   Wound / Incision (Open or Dehisced) 05/21/23 Diabetic ulcer Foot Right black round wound 05/21/23  1701  Foot  40            Labs/Imaging Results for orders placed or performed during the hospital encounter of 06/30/23 (from the past 48 hour(s))  CBC     Status: Abnormal   Collection Time: 06/30/23 12:43 PM  Result Value Ref Range   WBC 9.4 4.0 - 10.5 K/uL   RBC 3.80 (L) 4.22 - 5.81 MIL/uL   Hemoglobin 10.0 (L) 13.0 - 17.0 g/dL   HCT 16.1 (L) 09.6 - 04.5 %   MCV 86.1 80.0 - 100.0 fL   MCH 26.3 26.0 - 34.0 pg   MCHC 30.6 30.0 - 36.0 g/dL   RDW 40.9 (H) 81.1 - 91.4 %   Platelets 333 150 - 400 K/uL   nRBC 0.0 0.0 - 0.2 %    Comment: Performed at [facility], [address], [place], [place] [address]  Basic metabolic panel     Status: Abnormal   Collection Time: 06/30/23 12:43 PM  Result Value Ref Range   Sodium 136 135 - 145 mmol/L   Potassium 4.0 3.5 - 5.1 mmol/L   Chloride 102 98 - 111 mmol/L   CO2 23 22 - 32 mmol/L   Glucose, Bld 206 (H) 70 - 99 mg/dL    Comment: Glucose reference range applies only to samples taken after fasting for at least 8 hours.   BUN 18 8 - 23  mg/dL   Creatinine, Ser 6.21 0.61 - 1.24 mg/dL   Calcium 8.5 (L) 8.9 - 10.3 mg/dL   GFR, Estimated >30 >86 mL/min    Comment: (NOTE) Calculated using the CKD-EPI Creatinine  Equation (2021)    Anion gap 11 5 - 15    Comment: Performed at [facility], [address] [place], [place] [address]  Urinalysis, Routine w reflex microscopic -Urine, Catheterized     Status: Abnormal   Collection Time: 06/30/23 12:46 PM  Result Value Ref Range   Color, Urine YELLOW YELLOW   APPearance CLOUDY (A) CLEAR   Specific Gravity, Urine 1.011 1.005 - 1.030   pH 5.0 5.0 - 8.0   Glucose, UA NEGATIVE NEGATIVE mg/dL   Hgb urine dipstick NEGATIVE NEGATIVE   Bilirubin Urine NEGATIVE NEGATIVE   Ketones, ur NEGATIVE NEGATIVE mg/dL   Protein, ur 30 (A) NEGATIVE mg/dL   Nitrite NEGATIVE NEGATIVE   Leukocytes,Ua LARGE (A) NEGATIVE   RBC / HPF 6-10 0 - 5 RBC/hpf   WBC, UA >50 0 - 5 WBC/hpf   Bacteria, UA MANY (A) NONE SEEN   Squamous Epithelial / HPF 0-5 0 - 5 /HPF   WBC Clumps PRESENT    Mucus PRESENT    Hyaline Casts, UA PRESENT     Comment: Performed at [facility], [address] [place], [place] [address]  Blood culture (routine x 2)     Status: None (Preliminary result)   Collection Time: 06/30/23 12:49 PM   Specimen: BLOOD RIGHT FOREARM  Result Value Ref Range   Specimen Description      BLOOD RIGHT FOREARM Performed at [facility], [address], [place], [place] [address]    Special Requests      BOTTLES DRAWN AEROBIC AND ANAEROBIC Blood Culture adequate volume Performed at [facility], [address] [place], [place] [address]    Culture PENDING    Report Status PENDING   I-Stat CG4 Lactic Acid     Status: Abnormal   Collection Time: 06/30/23 12:55 PM  Result Value Ref Range   Lactic Acid, Venous 2.5 (HH) 0.5 - 1.9 mmol/L   Comment NOTIFIED PHYSICIAN   I-Stat CG4 Lactic Acid     Status: Abnormal   Collection Time:  06/30/23  2:30 PM  Result Value Ref Range   Lactic Acid, Venous 2.2 (HH) 0.5 - 1.9 mmol/L  I-Stat CG4 Lactic Acid, ED     Status: Abnormal   Collection Time: 06/30/23  2:37 PM  Result Value Ref Range   Lactic Acid, Venous 2.3 (HH) 0.5 - 1.9 mmol/L   Comment NOTIFIED PHYSICIAN    CT ABDOMEN PELVIS W CONTRAST  Result Date: 06/30/2023 CLINICAL DATA:  Sepsis. History of sacral and right ischial decubitus ulcers. 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 EXAM: CT ABDOMEN AND PELVIS WITH CONTRAST TECHNIQUE: Multidetector CT imaging of the abdomen and pelvis was performed using the standard protocol following bolus administration of intravenous contrast. RADIATION DOSE REDUCTION: This exam was performed according to the departmental dose-optimization program which includes automated exposure control, adjustment of the mA and/or kV according to patient size and/or use of iterative reconstruction technique. CONTRAST:  OMNIPAQUE IOHEXOL 300 MG/ML  SOLN COMPARISON:  06/05/2023 FINDINGS: Lower chest: Stable bibasilar scarring. Hepatobiliary: No focal liver abnormality is seen. Status post cholecystectomy. No biliary dilatation. Pancreas: Unremarkable. No pancreatic ductal dilatation or surrounding inflammatory changes. Spleen: Normal in size without focal abnormality. Adrenals/Urinary Tract: Adrenal glands are unremarkable. Stable appearance of kidneys including stable densely calcified cyst of the upper pole of the left kidney and benign Bosniak 1 cyst of the medial left kidney. The bladder is decompressed by  a stable suprapubic catheter. Stomach/Bowel: No bowel obstruction or ileus. Stable appearance of left lower quadrant colostomy. Stable appearance of stomach after repaired gastrocolic fistula at the level of the body of the stomach without evidence of recurrent fistula. No free intraperitoneal air. The appendix is normal. Vascular/Lymphatic: Stable atherosclerosis of the  abdominal aorta and iliac arteries without aneurysm. No lymphadenopathy identified. Reproductive: Prostate is unremarkable. Other: Stable right-sided posterior ischial decubitus ulcer that nearly extends to the bony surface of the posterior ischial tuberosity. Chronic sclerosis of the ischial tuberosity. No fluid collections. Minimal skin thickening overlying the sacrum without significant ulceration. Musculoskeletal: No fractures. Stable fused appearance of the L4 and L5 vertebral bodies. IMPRESSION: 1. No acute findings in the abdomen or pelvis. 2. Stable appearance of the stomach after repaired gastrocolic fistula at the level of the body of the stomach without evidence of recurrent fistula. 3. Stable right-sided posterior ischial decubitus ulcer that nearly extends to the bony surface of the posterior ischial tuberosity. Chronic sclerosis of the ischial tuberosity. No fluid collections. 4. Minimal skin thickening overlying the sacrum without significant ulceration. 5. Stable appearance of left lower quadrant colostomy. 6. Stable suprapubic catheter. 7. Stable densely calcified cyst of the upper pole of the left kidney and benign Bosniak 1 cyst of the medial left kidney. 8. Aortic atherosclerosis. Aortic Atherosclerosis (ICD10-I70.0). Electronically Signed   By: Irish Lack M.D.   On: 06/30/2023 16:04   DG Foot Complete Right  Result Date: 06/30/2023 CLINICAL DATA:  Plantar foot infection EXAM: RIGHT FOOT COMPLETE - 4 VIEW COMPARISON:  None Available. FINDINGS: Extensive soft tissue swelling about the foot. Tiny plantar and Achilles calcaneal spurs. Severe osteopenia. There is some soft tissue gas seen about the fifth digit. There is underlying bony erosive changes involving the head of fifth metatarsal. With patient's provided history and appearance an area of osteomyelitis is possible. No fracture or dislocation. IMPRESSION: Severe osteopenia and soft tissue swelling with soft tissue gas and thickening  about the fifth digit near the fifth metatarsophalangeal joint with underlying erosive changes suggested along the fifth metatarsal head. An area of bone infection is possible. Further workup as clinically appropriate such as MRI or bone scan to further delineate Electronically Signed   By: Karen Kays M.D.   On: 06/30/2023 15:02    Pending Labs Unresulted Labs (From admission, onward)     Start     Ordered   07/07/23 0500  Creatinine, serum  (enoxaparin (LOVENOX)    CrCl >/= 30 ml/min)  Weekly,   R     Comments: while on enoxaparin therapy    06/30/23 1720   07/01/23 0500  APTT  Tomorrow morning,   R        06/30/23 1720   07/01/23 0500  Protime-INR  Tomorrow morning,   R        06/30/23 1720   07/01/23 0500  Basic metabolic panel  Tomorrow morning,   R        06/30/23 1720   07/01/23 0500  CBC  Tomorrow morning,   R        06/30/23 1720   06/30/23 1719  CBC  (enoxaparin (LOVENOX)    CrCl >/= 30 ml/min)  Once,   R       Comments: Baseline for enoxaparin therapy IF NOT ALREADY DRAWN.  Notify MD if PLT < 100 K.    06/30/23 1720   06/30/23 1719  Creatinine, serum  (enoxaparin (LOVENOX)    CrCl >/=  30 ml/min)  Once,   R       Comments: Baseline for enoxaparin therapy IF NOT ALREADY DRAWN.    06/30/23 1720   06/30/23 1228  Blood culture (routine x 2)  BLOOD CULTURE X 2,   R (with STAT occurrences)      06/30/23 1228            Vitals/Pain Today's Vitals   06/30/23 1600 06/30/23 1608 06/30/23 1635 06/30/23 1700  BP: (!) 143/55  (!) 151/52 (!) 156/63  Pulse:   67   Resp: 15  17 16   Temp:  97.8 F (36.6 C)    TempSrc:      SpO2:   98%   Weight:      Height:      PainSc:        Isolation Precautions No active isolations  Medications Medications  lactated ringers infusion ( Intravenous New Bag/Given 06/30/23 1420)  vancomycin (VANCOREADY) IVPB 1500 mg/300 mL (1,500 mg Intravenous New Bag/Given 06/30/23 1633)  Gerhardt's butt cream ( Topical Given 06/30/23 1614)   enoxaparin (LOVENOX) injection 40 mg (has no administration in time range)  acetaminophen (TYLENOL) tablet 650 mg (has no administration in time range)    Or  acetaminophen (TYLENOL) suppository 650 mg (has no administration in time range)  polyethylene glycol (MIRALAX / GLYCOLAX) packet 17 g (has no administration in time range)  sodium chloride flush (NS) 0.9 % injection 3 mL (has no administration in time range)  iohexol (OMNIPAQUE) 300 MG/ML solution 100 mL (100 mLs Intravenous Contrast Given 06/30/23 1400)  cefTRIAXone (ROCEPHIN) 1 g in sodium chloride 0.9 % 100 mL IVPB (0 g Intravenous Stopped 06/30/23 1546)    Mobility non-ambulatory

## 2023-06-30 NOTE — Progress Notes (Signed)
A consult was received from an ED physician for vancomycin per pharmacy dosing (for an indication other than meningitis). The patient's profile has been reviewed for ht/wt/allergies/indication/available labs. A one time order has been placed for the above antibiotics.  Further antibiotics/pharmacy consults should be ordered by admitting physician if indicated.                       Bernadene Person, PharmD, BCPS [phone] 06/30/2023, 3:11 PM

## 2023-06-30 NOTE — Assessment & Plan Note (Signed)
Uncertain clinical significance, patient has already been on ceftriaxone as an outpatient for about 5 days.  Patient has chronic suprapubic catheter.  Patient is not having any fever or leukocytosis in the blood at this time.

## 2023-06-30 NOTE — Telephone Encounter (Signed)
[name],  From what I know of this patient and the problem they are describing, this is probably not where he needs to be seen for the issue.  Do not set an appointment with [place] at this time.    If he has a chronic sacral decubitus ulcer, there is most likely the concern that it could have gotten deeper and perhaps caused osteomyelitis or other complications.  He has a new patient appointment in the infectious disease clinic on 07/06/2023, and that would seem to be the next place for this patient to be evaluated.  If he has not been seen by a wound care clinic, then that is essential and urgent for him as well.  I will have to leave it to their discretion whether or not they think this problem requires attention in the emergency department.  Please convey all of that to the referring provider at [facility] rehab, and I have copied this note to Dr. Luciana Axe who will be seeing [name] next week.  Amada Jupiter, MD

## 2023-06-30 NOTE — Consult Note (Addendum)
WOC Nurse Consult Note: this patient is familiar to WOC team from prior admissions with chronic Stage 4 pressure injuries to sacrum and R ischium; this consult performed remotely utilizing EMR including photo documentation and chat with bedside nurse  Reason for Consult: Pressure injuries  Wound type: Stage 4 Pressure Injury Sacrum and R ischium  Pressure Injury POA: Yes Measurement: see nursing flowsheets  Wound bed: 1.  Erythema and purple discoloration to entire sacrum/buttocks consistent with chronic tissue damage and healing Stage 4 Pressure Injury; scattered areas of full thickness skin loss that appear pink and moist  2.  R ischial wound when last evaluated by WOC team was pink and moist; current photo has wound bed obscured  Drainage (amount, consistency, odor) see nursing flowsheet  Periwound: erythema  Dressing procedure/placement/frequency: Cleanse entire sacrum/buttocks with Vashe [name] [phone]) wound cleanser, apply Vashe moistened gauze to open areas sacrum, buttocks and R ischium. Cover R ischial wound with dry gauze and silicone foam.  Coat surrounding skin of buttocks with Gerhardt's butt cream 3 times daily and as needed for soiling.  May cover with ABD pad (would not use silicone foam to sacrum/buttocks as this will hold moisture onto skin).  Patient should be placed on a low air loss mattress for pressure redistribution and moisture management   POC discussed with bedside nurse and primary MD. WOC team will not follow at this time. Re-consult if further needs arise.   WOC Nurse ostomy consult note; has been seen by WOC team last 05/22/2023  Stoma type/location: LLQ colostomy  Stomal assessment/size: 1 3/4" round per last note  Peristomal assessment: not assessed  Treatment options for stomal/peristomal skin: 2" barrier ring  Output  Ostomy pouching: 2 piece 2 3/4" skin barrier [name] #2), 2 3/4" pouch [name] [phone]) and 2" barrier ring [name] [phone])    Education  provided: none, patient resides at SNF who manages ostomy for him  Enrolled patient in [facility] DC program: no, established ostomy   Thank you,    Priscella Mann MSN, RN-BC, [employer] [phone]

## 2023-06-30 NOTE — Telephone Encounter (Signed)
Unable to reach [place] at [facility]. VM box full. Will try again at a later time.

## 2023-06-30 NOTE — H&P (Signed)
History and Physical    Patient: Brent [name]. ZOX:[id number] DOB: 05-17-1946 DOA: 06/30/2023 DOS: the patient was seen and examined on 06/30/2023 PCP: [name], MD  Patient coming from: SNF  Chief Complaint:  Chief Complaint  Patient presents with   Wound Infection   HPI: Brent [name]. is a 77 y.o. male with medical history significant of quadriparesis with paraplegia due to transvrese myelitis, GI fistula status post wedge resection 9/24 , neurogenic bladder status post suprapubic catheter, stage IV decubitus ulcer, insulin-dependent DM2 .  colostomy in situ  Patient's most recent hospitalization was in mid September discharged on September 17 when patient was felt to have poor wound healing postoperatively after he had gastric fistula rejection resection done.  However at the time patient was cleared for discharged within 24 hours.  Regarding patient's current presentation today, history obtained from the patient as well as from the ER signout and record review.  Patient basically has right foot pressure ulcer, see representative photo in the chart for approximately 2 weeks, patient has been treated with ceftriaxone for 5 days.  However I am advised that this has been worked up in the past and patient is known to have bone deep ulcer.  With osteomyelitis.  And is pending a bone culture from the site.  However I could not find corroboration of this in the documentation.  Further patient also has right calf dependent area erythema that has been going on for several days which has been bandaged.  Further patient has sacral edema/ulceration for approximately 3 years per patient.  Which hurts him pretty much constantly.  Has been hurting him more for the last several weeks.  Per report, this has been "progressing "in spite of best Rx at his residence prompting the patient to be sent to the ER.  Patient does not report any acute onset symptoms at this time, however is feeling  tired because of the pain and the frequent transfers.  No report of fever nausea vomiting or diarrhea.  Patient does have a chronic colostomy  Review of Systems: As mentioned in the history of present illness. All other systems reviewed and are negative. 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 angioplasty of his RCA in 1990. b. bare metal stent placed in the RCA in 2000 followed by rotational atherectomy shortly after for stent restenosis. c. last cath was in 2012 showing stable moderate diffuse CAD. (70% mid LAD, 80% diagonal, 70% Ramus, 40% mid to distal RCA stent restenosis). d. Low risk nuc in 2015.   Diabetes mellitus    Diverticulosis    Elevated CK    Erectile dysfunction    Hemorrhoids    HTN (hypertension)    Hyperlipidemia    Hypertriglyceridemia    Malignant melanoma of left side of neck (HCC) 10/25/2018   Myocardial infarction (HCC)    Obesity    OSA (obstructive sleep apnea)    Persistent disorder of initiating or maintaining sleep    Personal history of colonic polyps 02/05/2003   Renal lesion 06/20/2016   Past Surgical History:  Procedure Laterality Date   BIOPSY  03/26/2022   Procedure: BIOPSY;  Surgeon: Iva Boop, MD;  Location: [facility] ENDOSCOPY;  Service: Gastroenterology;;   CHOLECYSTECTOMY     CORONARY ANGIOPLASTY     CORONARY STENT PLACEMENT     stenting of the right  coronary artery with followup rotational  atherectomy. (3 stents placed)   ENDOSCOPIC MUCOSAL RESECTION  10/13/2022   Procedure: ENDOSCOPIC MUCOSAL RESECTION;  Surgeon: Meridee Score Netty Starring., MD;  Location: [facility] ENDOSCOPY;  Service: Gastroenterology;;   ESOPHAGOGASTRODUODENOSCOPY (EGD) WITH PROPOFOL N/A 03/26/2022   Procedure: ESOPHAGOGASTRODUODENOSCOPY (EGD) WITH PROPOFOL;  Surgeon: Iva Boop, MD;  Location: [facility] ENDOSCOPY;  Service: Gastroenterology;  Laterality: N/A;   ESOPHAGOGASTRODUODENOSCOPY  (EGD) WITH PROPOFOL N/A 07/11/2022   Procedure: ESOPHAGOGASTRODUODENOSCOPY (EGD) WITH PROPOFOL;  Surgeon: Meridee Score Netty Starring., MD;  Location: [facility] ENDOSCOPY;  Service: Gastroenterology;  Laterality: N/A;   ESOPHAGOGASTRODUODENOSCOPY (EGD) WITH PROPOFOL N/A 10/13/2022   Procedure: ESOPHAGOGASTRODUODENOSCOPY (EGD) WITH PROPOFOL;  Surgeon: Meridee Score Netty Starring., MD;  Location: [facility] ENDOSCOPY;  Service: Gastroenterology;  Laterality: N/A;   EUS N/A 10/13/2022   Procedure: UPPER ENDOSCOPIC ULTRASOUND (EUS) RADIAL;  Surgeon: Lemar Lofty., MD;  Location: [facility] ENDOSCOPY;  Service: Gastroenterology;  Laterality: N/A;   FINGER SURGERY     right   FOOT SURGERY     right   HEMOSTASIS CLIP PLACEMENT  07/11/2022   Procedure: HEMOSTASIS CLIP PLACEMENT;  Surgeon: Meridee Score Netty Starring., MD;  Location: [facility] ENDOSCOPY;  Service: Gastroenterology;;  ovesco    HEMOSTASIS CLIP PLACEMENT  10/13/2022   Procedure: HEMOSTASIS CLIP PLACEMENT;  Surgeon: Lemar Lofty., MD;  Location: [facility] ENDOSCOPY;  Service: Gastroenterology;;   HOT HEMOSTASIS N/A 07/11/2022   Procedure: HOT HEMOSTASIS (ARGON PLASMA COAGULATION/BICAP);  Surgeon: Lemar Lofty., MD;  Location: [facility] ENDOSCOPY;  Service: Gastroenterology;  Laterality: N/A;   HOT HEMOSTASIS N/A 10/13/2022   Procedure: HOT HEMOSTASIS (ARGON PLASMA COAGULATION/BICAP);  Surgeon: Lemar Lofty., MD;  Location: [facility] ENDOSCOPY;  Service: Gastroenterology;  Laterality: N/A;   INGUINAL HERNIA REPAIR     right   IR FLUORO PROCEDURE UNLISTED  02/21/2023   IR GASTROSTOMY TUBE REMOVAL  02/10/2022   IR REPLACE G-TUBE SIMPLE WO FLUORO  03/14/2023   IR REPLC GASTRO/COLONIC TUBE PERCUT W/FLUORO  12/22/2022   IR REPLC GASTRO/COLONIC TUBE PERCUT W/FLUORO  01/26/2023   IR REPLC GASTRO/COLONIC TUBE PERCUT W/FLUORO  03/06/2023   IR REPLC GASTRO/COLONIC TUBE PERCUT W/FLUORO  04/27/2023   IRRIGATION AND DEBRIDEMENT ABSCESS N/A 06/04/2020   Procedure: IRRIGATION AND  DEBRIDEMENT SACRAL WOUND;  Surgeon: Diamantina Monks, MD;  Location: [facility] OR;  Service: General;  Laterality: N/A;   LAPAROSCOPY N/A 06/04/2020   Procedure: LAPAROSCOPY ASSISTED COLOSTOMY, LAPAROSCOPY GASTROSTOMY;  Surgeon: Diamantina Monks, MD;  Location: [facility] OR;  Service: General;  Laterality: N/A;   LAPAROSCOPY N/A 05/24/2023   Procedure: LAPAROSCOPY DIAGNOSTIC;  Surgeon: Emelia Loron, MD;  Location: [facility] OR;  Service: General;  Laterality: N/A;   LAPAROTOMY N/A 05/24/2023   Procedure: WEDGED RESECTION OF STOMACH AND GASTROCUTANEOUS FISTULA;  Surgeon: Emelia Loron, MD;  Location: [facility] OR;  Service: General;  Laterality: N/A;   LYSIS OF ADHESION N/A 06/04/2020   Procedure: LYSIS OF ADHESION;  Surgeon: Diamantina Monks, MD;  Location: [facility] OR;  Service: General;  Laterality: N/A;   LYSIS OF ADHESION N/A 05/24/2023   Procedure: LYSIS OF ADHESION;  Surgeon: Emelia Loron, MD;  Location: [facility] OR;  Service: General;  Laterality: N/A;   melanoma removal     neck   ORTHOPEDIC SURGERY     foot right   POLYPECTOMY  10/13/2022   Procedure: POLYPECTOMY;  Surgeon: Lemar Lofty., MD;  Location: [facility] ENDOSCOPY;  Service: Gastroenterology;;   rotator cuff surg     Bil   STENT REMOVAL  10/13/2022  Procedure: CLIP REMOVAL;  Surgeon: Lemar Lofty., MD;  Location: [facility] ENDOSCOPY;  Service: Gastroenterology;;   Sunnie Nielsen LIFTING INJECTION  10/13/2022   Procedure: SUBMUCOSAL LIFTING INJECTION;  Surgeon: Lemar Lofty., MD;  Location: [facility] ENDOSCOPY;  Service: Gastroenterology;;   SUBMUCOSAL TATTOO INJECTION  10/13/2022   Procedure: SUBMUCOSAL TATTOO INJECTION;  Surgeon: Lemar Lofty., MD;  Location: [facility] ENDOSCOPY;  Service: Gastroenterology;;   Social History:  reports that he quit smoking about 42 years ago. His smoking use included cigarettes. He started smoking about 72 years ago. He has a 45 pack-year smoking history. He has never used smokeless tobacco. He reports that he does  not currently use alcohol after a past usage of about 7.0 - 14.0 standard drinks of alcohol per week. He reports that he does not use drugs.  Allergies  Allergen Reactions   Cipro [Ciprofloxacin Hcl] Other (See Comments)    Hallucinations Allergy not listed on MAR   Neurontin [Gabapentin] Other (See Comments)    Unknown reaction   Levaquin [Levofloxacin] Rash   Penicillins Hives and Rash    Tolerated Unasyn, Augmentin, cefepime and cephalexin    Family History  Problem Relation Age of Onset   Lung cancer Mother    COPD Mother    Obesity Mother    Liver cancer Father    Lung cancer Father    Heart disease Father    Heart disease Sister    Colon cancer Neg Hx    Stomach cancer Neg Hx    Pancreatic cancer Neg Hx    Esophageal cancer Neg Hx    Inflammatory bowel disease Neg Hx    Liver disease Neg Hx    Rectal cancer Neg Hx     Prior to Admission medications   Medication Sig Start Date End Date Taking? Authorizing Provider  acetaminophen (TYLENOL) 500 MG tablet Take 2 tablets (1,000 mg total) by mouth every 6 (six) hours as needed for mild pain (or Fever >/= 101). 05/31/23  Yes Rhetta Mura, MD  atenolol (TENORMIN) 25 MG tablet Take 12.5-25 mg by mouth See admin instructions. Take 12.5 mg (1/2 tablet) by mouth once daily in the morning and 25 mg (1 tablet) at bedtime.   Yes [provider]  atorvastatin (LIPITOR) 40 MG tablet Take 1 tablet (40 mg total) by mouth at bedtime. 05/31/23  Yes Rhetta Mura, MD  baclofen (LIORESAL) 20 MG tablet Take 1 tablet (20 mg total) by mouth 3 (three) times daily. 06/06/23  Yes Amin, Ankit C, MD  cholecalciferol (VITAMIN D3) 25 MCG (1000 UNIT) tablet Take 2,000 Units by mouth daily.   Yes [provider]  doxycycline (MONODOX) 100 MG capsule Take 100 mg by mouth 2 (two) times daily.   Yes [provider]  DULoxetine (CYMBALTA) 30 MG capsule Take 1 capsule (30 mg total) by mouth at bedtime. 05/31/23  Yes  Rhetta Mura, MD  famotidine (PEPCID) 20 MG tablet Take 20 mg by mouth 2 (two) times daily.   Yes [provider]  insulin aspart (NOVOLOG) 100 UNIT/ML injection Inject 0-15 Units into the skin every 4 (four) hours. Sliding scale  CBG 70 - 120: 0 units: CBG 121 - 150: 2 units; CBG 151 - 200: 3 units; CBG 201 - 250: 5 units; CBG 251 - 300: 8 units;CBG 301 - 350: 11 units; CBG 351 - 400: 15 units; CBG > 400 : 15 units and notify MD Patient taking differently: Inject 0-15 Units into the skin See admin instructions.  Inject 0-15 units before meals per sliding scale: 70-120 : 0 units 121-150 : 2 units 151-200 : 3 units 201-250 : 5 units 251-300 : 8 units 301-350 : 11 units 351-400 : 15 units > 400 : 15 units, notify MD 06/10/20  Yes Rai, Ripudeep K, MD  insulin glargine-yfgn (SEMGLEE, YFGN,) 100 UNIT/ML Pen Inject 35 Units into the skin 2 (two) times daily.   Yes [provider]  lamoTRIgine (LAMICTAL) 25 MG tablet Take 2 tablets (50 mg total) by mouth 2 (two) times daily. For 5 days. Started 02/18/23. 05/31/23  Yes Rhetta Mura, MD  LORazepam (ATIVAN) 0.5 MG tablet Take 1 tablet (0.5 mg total) by mouth daily as needed (panic attacks). 06/06/23  Yes Amin, Ankit C, MD  losartan (COZAAR) 50 MG tablet Take 75 mg by mouth daily.   Yes [provider]  Menthol, Topical Analgesic, (BIOFREEZE) 4 % GEL Apply 1 application  topically 2 (two) times daily as needed (pain to back (upper and lower),arms and shoulders).   Yes [provider]  metFORMIN (GLUCOPHAGE) 500 MG tablet Take 1,000 mg by mouth 2 (two) times daily with a meal.   Yes [provider]  nystatin powder Apply 1 Application topically daily. Apply to peri-wound bed, sacrum and right ischium daily with routine dressing.   Yes [provider]  omeprazole (PRILOSEC) 40 MG capsule Take 1 capsule (40 mg total) by mouth 2 (two) times daily. 30 minutes before breakfast/dinner Patient taking  differently: Take 40 mg by mouth in the morning and at bedtime. 10/13/22  Yes Mansouraty, Netty Starring., MD  ondansetron (ZOFRAN-ODT) 4 MG disintegrating tablet Take 4 mg by mouth every 8 (eight) hours as needed for nausea or vomiting.   Yes [provider]  oxyCODONE (OXY IR/ROXICODONE) 5 MG immediate release tablet Take 1 tablet (5 mg total) by mouth 2 (two) times daily as needed for breakthrough pain. 06/06/23  Yes Amin, Ankit C, MD  Polyethyl Glycol-Propyl Glycol (GENTEAL TEARS SEVERE DAY/NIGHT) 0.4-0.3 % GEL ophthalmic gel Place 1 Application into both eyes 4 (four) times daily as needed (dry eyes).   Yes [provider]  PRESCRIPTION MEDICATION Inject 10 mg into the vein every 6 (six) months. Rituxan 10/ml  Between the 12-13th of June and December   Yes [provider]  senna-docusate (SENOKOT-S) 8.6-50 MG tablet Take 1 tablet by mouth 2 (two) times daily.   Yes [provider]  traZODone (DESYREL) 50 MG tablet Take 1.5 tablets (75 mg total) by mouth at bedtime. 05/31/23  Yes Rhetta Mura, MD  vitamin C (ASCORBIC ACID) 500 MG tablet Take 500 mg by mouth daily.   Yes [provider]  acetic acid 0.25 % irrigation Irrigate with 1 Application as directed daily. Patient not taking: Reported on 06/30/2023    [provider]  insulin glargine (LANTUS) 100 unit/mL SOPN Inject 35 Units into the skin 2 (two) times daily. Patient not taking: Reported on 06/30/2023 05/31/23   Rhetta Mura, MD    Physical Exam: Vitals:   06/30/23 1212 06/30/23 1430 06/30/23 1608 06/30/23 1635  BP: (!) 124/42 (!) 147/53  (!) 151/52  Pulse: (!) 56 61  67  Resp: 18 19  17   Temp: 97.6 F (36.4 C)  97.8 F (36.6 C)   TempSrc: Oral     SpO2: 99% 97%  98%  Weight: 98 kg     Height: 6' (1.829 m)      General: Patient does not appear to be  distressed, seems reasonably nourished.  No immediate distress.  Patient is able to give a reasonably coherent account  of his symptoms over the last several days weeks and months. Respiratory exam: Bilateral intravesicular Cardiovascular exam S1-S2 normal Abdomen: Suprapubic catheter is seen colostomy seen, patient is also noted to have 3 dressings applied over small wounds of the abdomen.  Which I am told are postsurgical wounds. All quadrants are soft nontender.  Right plantar ulceration on the lateral aspect of the foot is seen.  See representative photo in the media section.  This does not probe all the way down to the bone for me but it is fairly deep, no gross purulent discharge or foul smell.  No spreading erythema.  Approximately 1.5 x 1.5 cm round    Media Information  Document Information  Photos  Right foot  06/30/2023 17:00  Attached To:  Hospital Encounter on 06/30/23  Source Information  Nolberto Hanlon, MD  [facility]  Document History     Further patient has erythema approximately 10 cm x 5 cm oval-shaped in the right calf dependent area where it lies on the bed.  Nonblanching.  Further patient has sacral ulcer erythema mixed with ulcers that are stage II to stage III deep.  See representative photo below.  No purulence no foul smell.  No fluctuation at noted.  No anal discharge noted.    Media Information  Document Information  Photos    06/30/2023 14:50  Attached To:  Hospital Encounter on 06/30/23  Source Information  Clent Ridges  [facility]  Document History   While most of the sacral ulceration is erythematous, or already ulcerated.  Some areas of dusky discoloration are noted, see as appreciated in photo raising concern for soft tissue deep injury.  Data Reviewed:  Labs on Admission:  Results for orders placed or performed during the hospital encounter of 06/30/23 (from the past 24 hour(s))  CBC     Status: Abnormal   Collection Time: 06/30/23 12:43 PM  Result Value Ref Range   WBC 9.4 4.0 - 10.5 K/uL   RBC 3.80 (L) 4.22 - 5.81  MIL/uL   Hemoglobin 10.0 (L) 13.0 - 17.0 g/dL   HCT 29.5 (L) 62.1 - 30.8 %   MCV 86.1 80.0 - 100.0 fL   MCH 26.3 26.0 - 34.0 pg   MCHC 30.6 30.0 - 36.0 g/dL   RDW 65.7 (H) 84.6 - 96.2 %   Platelets 333 150 - 400 K/uL   nRBC 0.0 0.0 - 0.2 %  Basic metabolic panel     Status: Abnormal   Collection Time: 06/30/23 12:43 PM  Result Value Ref Range   Sodium 136 135 - 145 mmol/L   Potassium 4.0 3.5 - 5.1 mmol/L   Chloride 102 98 - 111 mmol/L   CO2 23 22 - 32 mmol/L   Glucose, Bld 206 (H) 70 - 99 mg/dL   BUN 18 8 - 23 mg/dL   Creatinine, Ser 9.52 0.61 - 1.24 mg/dL   Calcium 8.5 (L) 8.9 - 10.3 mg/dL   GFR, Estimated >84 >13 mL/min   Anion gap 11 5 - 15  Urinalysis, Routine w reflex microscopic -Urine, Catheterized     Status: Abnormal   Collection Time: 06/30/23 12:46 PM  Result Value Ref Range   Color, Urine YELLOW YELLOW   APPearance CLOUDY (A) CLEAR   Specific Gravity, Urine 1.011 1.005 - 1.030   pH 5.0 5.0 - 8.0   Glucose, UA NEGATIVE  NEGATIVE mg/dL   Hgb urine dipstick NEGATIVE NEGATIVE   Bilirubin Urine NEGATIVE NEGATIVE   Ketones, ur NEGATIVE NEGATIVE mg/dL   Protein, ur 30 (A) NEGATIVE mg/dL   Nitrite NEGATIVE NEGATIVE   Leukocytes,Ua LARGE (A) NEGATIVE   RBC / HPF 6-10 0 - 5 RBC/hpf   WBC, UA >50 0 - 5 WBC/hpf   Bacteria, UA MANY (A) NONE SEEN   Squamous Epithelial / HPF 0-5 0 - 5 /HPF   WBC Clumps PRESENT    Mucus PRESENT    Hyaline Casts, UA PRESENT   Blood culture (routine x 2)     Status: None (Preliminary result)   Collection Time: 06/30/23 12:49 PM   Specimen: BLOOD RIGHT FOREARM  Result Value Ref Range   Specimen Description      BLOOD RIGHT FOREARM Performed at [facility], [address], [place], [place] [address]    Special Requests      BOTTLES DRAWN AEROBIC AND ANAEROBIC Blood Culture adequate volume Performed at [facility], [address] [place], [place] [address]    Culture PENDING    Report Status PENDING   I-Stat  CG4 Lactic Acid     Status: Abnormal   Collection Time: 06/30/23 12:55 PM  Result Value Ref Range   Lactic Acid, Venous 2.5 (HH) 0.5 - 1.9 mmol/L   Comment NOTIFIED PHYSICIAN   I-Stat CG4 Lactic Acid     Status: Abnormal   Collection Time: 06/30/23  2:30 PM  Result Value Ref Range   Lactic Acid, Venous 2.2 (HH) 0.5 - 1.9 mmol/L  I-Stat CG4 Lactic Acid, ED     Status: Abnormal   Collection Time: 06/30/23  2:37 PM  Result Value Ref Range   Lactic Acid, Venous 2.3 (HH) 0.5 - 1.9 mmol/L   Comment NOTIFIED PHYSICIAN    Basic Metabolic Panel: Recent Labs  Lab 06/30/23 1243  NA 136  K 4.0  CL 102  CO2 23  GLUCOSE 206*  BUN 18  CREATININE 0.71  CALCIUM 8.5*   Liver Function Tests: No results for input(s): "AST", "ALT", "ALKPHOS", "BILITOT", "PROT", "ALBUMIN" in the last 168 hours. No results for input(s): "LIPASE", "AMYLASE" in the last 168 hours. No results for input(s): "AMMONIA" in the last 168 hours. CBC: Recent Labs  Lab 06/30/23 1243  WBC 9.4  HGB 10.0*  HCT 32.7*  MCV 86.1  PLT 333   Cardiac Enzymes: No results for input(s): "CKTOTAL", "CKMB", "CKMBINDEX", "TROPONINIHS" in the last 168 hours.  BNP (last 3 results) No results for input(s): "PROBNP" in the last 8760 hours. CBG: No results for input(s): "GLUCAP" in the last 168 hours.  Radiological Exams on Admission:  CT ABDOMEN PELVIS W CONTRAST  Result Date: 06/30/2023 CLINICAL DATA:  Sepsis. History of sacral and right ischial decubitus ulcers. 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 EXAM: CT ABDOMEN AND PELVIS WITH CONTRAST TECHNIQUE: Multidetector CT imaging of the abdomen and pelvis was performed using the standard protocol following bolus administration of intravenous contrast. RADIATION DOSE REDUCTION: This exam was performed according to the departmental dose-optimization program which includes automated exposure control, adjustment of the mA and/or kV according to patient size  and/or use of iterative reconstruction technique. CONTRAST:  OMNIPAQUE IOHEXOL 300 MG/ML  SOLN COMPARISON:  06/05/2023 FINDINGS: Lower chest: Stable bibasilar scarring. Hepatobiliary: No focal liver abnormality is seen. Status post cholecystectomy. No biliary dilatation. Pancreas: Unremarkable. No pancreatic ductal dilatation or surrounding inflammatory changes. Spleen: Normal in  size without focal abnormality. Adrenals/Urinary Tract: Adrenal glands are unremarkable. Stable appearance of kidneys including stable densely calcified cyst of the upper pole of the left kidney and benign Bosniak 1 cyst of the medial left kidney. The bladder is decompressed by a stable suprapubic catheter. Stomach/Bowel: No bowel obstruction or ileus. Stable appearance of left lower quadrant colostomy. Stable appearance of stomach after repaired gastrocolic fistula at the level of the body of the stomach without evidence of recurrent fistula. No free intraperitoneal air. The appendix is normal. Vascular/Lymphatic: Stable atherosclerosis of the abdominal aorta and iliac arteries without aneurysm. No lymphadenopathy identified. Reproductive: Prostate is unremarkable. Other: Stable right-sided posterior ischial decubitus ulcer that nearly extends to the bony surface of the posterior ischial tuberosity. Chronic sclerosis of the ischial tuberosity. No fluid collections. Minimal skin thickening overlying the sacrum without significant ulceration. Musculoskeletal: No fractures. Stable fused appearance of the L4 and L5 vertebral bodies. IMPRESSION: 1. No acute findings in the abdomen or pelvis. 2. Stable appearance of the stomach after repaired gastrocolic fistula at the level of the body of the stomach without evidence of recurrent fistula. 3. Stable right-sided posterior ischial decubitus ulcer that nearly extends to the bony surface of the posterior ischial tuberosity. Chronic sclerosis of the ischial tuberosity. No fluid collections. 4.  Minimal skin thickening overlying the sacrum without significant ulceration. 5. Stable appearance of left lower quadrant colostomy. 6. Stable suprapubic catheter. 7. Stable densely calcified cyst of the upper pole of the left kidney and benign Bosniak 1 cyst of the medial left kidney. 8. Aortic atherosclerosis. Aortic Atherosclerosis (ICD10-I70.0). Electronically Signed   By: Irish Lack M.D.   On: 06/30/2023 16:04   DG Foot Complete Right  Result Date: 06/30/2023 CLINICAL DATA:  Plantar foot infection EXAM: RIGHT FOOT COMPLETE - 4 VIEW COMPARISON:  None Available. FINDINGS: Extensive soft tissue swelling about the foot. Tiny plantar and Achilles calcaneal spurs. Severe osteopenia. There is some soft tissue gas seen about the fifth digit. There is underlying bony erosive changes involving the head of fifth metatarsal. With patient's provided history and appearance an area of osteomyelitis is possible. No fracture or dislocation. IMPRESSION: Severe osteopenia and soft tissue swelling with soft tissue gas and thickening about the fifth digit near the fifth metatarsophalangeal joint with underlying erosive changes suggested along the fifth metatarsal head. An area of bone infection is possible. Further workup as clinically appropriate such as MRI or bone scan to further delineate Electronically Signed   By: Karen Kays M.D.   On: 06/30/2023 15:02    EKG: Independently reviewed. Sinus brady.   Assessment and Plan: * Pressure ulcer 3 pressure injury identified is identified, see representative photos in the exam section  Regarding the pressure injury on the right calf area.  I think this is the least complicated at this time.  And represents likely a single stage I ulceration, which needs to be offloaded and turned and repositioned.  Regarding the ulceration on the plantar aspect lateral of the right foot, there is may be complicated by bone osteomyelitis as is evidenced on the x-ray.  However I do  not see any marked soft tissue infection at this time that would necessitate antibiotics at this time.  Therefore we are awaiting an MRI that has been ordered by the ER attending.  We will proceed from there.  Regarding the sacral decubitus, this is on been ongoing for 3 years, and has progressed per report in the last 5 to 7 days.  This  is a mix of stage I to stage III ulceration based on my exam.  Fortunately I do not see marked undermining or purulence or foul smell.  However there are areas of dusky skin appearance concerning for deep tissue injury.  Therefore it is possible that in spite of best care that this ulcer will worsen clinically before improving.  Obviously given the extent of erythema, it is impossible to rule out infection 100%.  Patient has already been on ceftriaxone for approximately 5 days per report from outside facility.  Patient has further received a dose of vancomycin plus ceftriaxone in the ER.  Blood cultures are pending.  At this time general measures of wound care still apply will request wound care evaluation, turn and patient reposition patient.  I will follow blood cultures and continue with intravenous ceftriaxone for now.  Patient is already on a 10-day course of doxycycline which is ending tomorrow, I will continue with that orally.  Lactic acidosis Patient has mild lactic acid elevation, does not appear toxic.  I think this is emanating from use of metformin, CKD, patient's blood pressure, especially his diastolic blood pressure is actually below target.  Additionally tissue ischemia and regions of pressure would also cause lactic acidosis.  I do not think that this is necessarily suggestive of sepsis or uncontrolled infection in this patient.  Out of an abundance of caution patient will be maintained on lactated Ringer till tomorrow morning.  I obviously plan to cut down on patient's antihypertensive regimen, metformin and trend the lactic acid in the morning now.  Urine  leukocytes Uncertain clinical significance, patient has already been on ceftriaxone as an outpatient for about 5 days.  Patient has chronic suprapubic catheter.  Patient is not having any fever or leukocytosis in the blood at this time.      Advance Care Planning:   Code Status: Prior patient wishes to be full code, this is corroborated on his review of record  Consults: Wound care assistance has been sought  Family Communication: Per patient  Severity of Illness: The appropriate patient status for this patient is INPATIENT. Inpatient status is judged to be reasonable and necessary in order to provide the required intensity of service to ensure the patient's safety. The patient's presenting symptoms, physical exam findings, and initial radiographic and laboratory data in the context of their chronic comorbidities is felt to place them at high risk for further clinical deterioration. Furthermore, it is not anticipated that the patient will be medically stable for discharge from the hospital within 2 midnights of admission.   * I certify that at the point of admission it is my clinical judgment that the patient will require inpatient hospital care spanning beyond 2 midnights from the point of admission due to high intensity of service, high risk for further deterioration and high frequency of surveillance required.*  Given patient's progression of wounds in spite of best available outpatient care, I think this patient will need to nights of hospitalization for very frequent interdisciplinary care to set up the best plan for patient care as well as to evaluate and treat suspected osteomyelitis of the right foot.  Author: Nolberto Hanlon, MD 06/30/2023 5:18 PM  For on call review [email or web address].

## 2023-06-30 NOTE — ED Triage Notes (Addendum)
Patient brought in by EMS from [facility] with c/o dermitis. Per facility pt has R foot infection and is currently taking Ceftriaxone 1gm IM x5 days, facility wanted patient sent out to make sure infection had not spread. He is A&Ox4, NAD, denies N/V/D. 132/56 58 16 95% RA CBG: 223 98.1- Oral

## 2023-06-30 NOTE — Assessment & Plan Note (Addendum)
3 pressure injury identified is identified, see representative photos in the exam section  Regarding the pressure injury on the right calf area.  I think this is the least complicated at this time.  And represents likely a single stage I ulceration, which needs to be offloaded and turned and repositioned.  Regarding the ulceration on the plantar aspect lateral of the right foot, there is may be complicated by bone osteomyelitis as is evidenced on the x-ray.  However I do not see any marked soft tissue infection at this time that would necessitate antibiotics at this time.  Therefore we are awaiting an MRI that has been ordered by the ER attending.  We will proceed from there.  Regarding the sacral decubitus, this is on been ongoing for 3 years, and has progressed per report in the last 5 to 7 days.  This is a mix of stage I to stage III ulceration based on my exam.  Fortunately I do not see marked undermining or purulence or foul smell.  However there are areas of dusky skin appearance concerning for deep tissue injury.  Therefore it is possible that in spite of best care that this ulcer will worsen clinically before improving.  Obviously given the extent of erythema, it is impossible to rule out infection 100%.  Patient has already been on ceftriaxone for approximately 5 days per report from outside facility.  Patient has further received a dose of vancomycin plus ceftriaxone in the ER.  Blood cultures are pending.  At this time general measures of wound care still apply will request wound care evaluation, turn and patient reposition patient.  I will follow blood cultures and continue with intravenous ceftriaxone for now.  Patient is already on a 10-day course of doxycycline which is ending tomorrow, I will continue with that orally.

## 2023-06-30 NOTE — ED Notes (Signed)
Cleaned patient's wound with Vashe, placed Vashe soaked gauze on sacral wound, put Gerhardt's cream around edges of wound and then covered with ABD pads. This was done per guidance from Wound Care nurse. Patient tolerated procedure well.

## 2023-06-30 NOTE — Assessment & Plan Note (Signed)
Patient has mild lactic acid elevation, does not appear toxic.  I think this is emanating from use of metformin, CKD, patient's blood pressure, especially his diastolic blood pressure is actually below target.  Additionally tissue ischemia and regions of pressure would also cause lactic acidosis.  I do not think that this is necessarily suggestive of sepsis or uncontrolled infection in this patient.  Out of an abundance of caution patient will be maintained on lactated Ringer till tomorrow morning.  I obviously plan to cut down on patient's antihypertensive regimen, metformin and trend the lactic acid in the morning now.

## 2023-06-30 NOTE — ED Provider Notes (Signed)
[place] EMERGENCY DEPARTMENT AT [facility] Provider Note   CSN: [id number] Arrival date & time: 06/30/23  1152     History  Chief Complaint  Patient presents with   Wound Infection    Brent [name]. is a 77 y.o. 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  Patient presents to ED for evaluation of possible infection to right foot wound, sacral ulcer.  Patient was sent here by his facility.  The patient reports that on Tuesday he was told he might have an infection of his foot bone and started on antibiotics, ceftriaxone 1 g IM.  Patient reports that in the last 2 weeks he has had no fevers, nausea, vomiting, diarrhea, body aches or chills.  Patient is a slightly poor story and cannot provide much detail as to history of his foot wound or ulcer.  On chart review, it appears that the patient GI office was contacted yesterday for concern of "pus coming out of rectum" as well as worsening sacral ulcer.  Patient states that he has been being seen by wound care who advises him that his wound has "gone back 1-1/2 years".  Patient unable to explain what this means.  He is alert and oriented x 4.  He denies any other medical concerns.  States he feels at baseline.   HPI     Home Medications Prior to Admission medications   Medication Sig Start Date End Date Taking? Authorizing Provider  acetaminophen (TYLENOL) 500 MG tablet Take 2 tablets (1,000 mg total) by mouth every 6 (six) hours as needed for mild pain (or Fever >/= 101). 05/31/23  Yes Rhetta Mura, MD  atenolol (TENORMIN) 25 MG tablet Take 12.5-25 mg by mouth See admin instructions. Take 12.5 mg (1/2 tablet) by mouth once daily in the morning and 25 mg (1 tablet) at bedtime.   Yes [provider]   atorvastatin (LIPITOR) 40 MG tablet Take 1 tablet (40 mg total) by mouth at bedtime. 05/31/23  Yes Rhetta Mura, MD  baclofen (LIORESAL) 20 MG tablet Take 1 tablet (20 mg total) by mouth 3 (three) times daily. 06/06/23  Yes Amin, Ankit C, MD  cholecalciferol (VITAMIN D3) 25 MCG (1000 UNIT) tablet Take 2,000 Units by mouth daily.   Yes [provider]  doxycycline (MONODOX) 100 MG capsule Take 100 mg by mouth 2 (two) times daily.   Yes [provider]  DULoxetine (CYMBALTA) 30 MG capsule Take 1 capsule (30 mg total) by mouth at bedtime. 05/31/23  Yes Rhetta Mura, MD  famotidine (PEPCID) 20 MG tablet Take 20 mg by mouth 2 (two) times daily.   Yes [provider]  insulin aspart (NOVOLOG) 100 UNIT/ML injection Inject 0-15 Units into the skin every 4 (four) hours. Sliding scale  CBG 70 - 120: 0 units: CBG 121 - 150: 2 units; CBG 151 - 200: 3 units; CBG 201 - 250: 5 units; CBG 251 - 300: 8 units;CBG 301 - 350: 11 units; CBG 351 - 400: 15 units; CBG > 400 : 15 units and notify MD Patient taking differently: Inject 0-15 Units into the skin See admin instructions. Inject 0-15 units before meals per sliding scale: 70-120 : 0 units 121-150 : 2 units 151-200 : 3 units 201-250 : 5 units 251-300 : 8 units 301-350 : 11 units 351-400 : 15 units >  400 : 15 units, notify MD 06/10/20  Yes Rai, Ripudeep K, MD  insulin glargine-yfgn (SEMGLEE, YFGN,) 100 UNIT/ML Pen Inject 35 Units into the skin 2 (two) times daily.   Yes [provider]  lamoTRIgine (LAMICTAL) 25 MG tablet Take 2 tablets (50 mg total) by mouth 2 (two) times daily. For 5 days. Started 02/18/23. 05/31/23  Yes Rhetta Mura, MD  LORazepam (ATIVAN) 0.5 MG tablet Take 1 tablet (0.5 mg total) by mouth daily as needed (panic attacks). 06/06/23  Yes Amin, Ankit C, MD  losartan (COZAAR) 50 MG tablet Take 75 mg by mouth daily.   Yes [provider]  Menthol, Topical Analgesic, (BIOFREEZE) 4 %  GEL Apply 1 application  topically 2 (two) times daily as needed (pain to back (upper and lower),arms and shoulders).   Yes [provider]  metFORMIN (GLUCOPHAGE) 500 MG tablet Take 1,000 mg by mouth 2 (two) times daily with a meal.   Yes [provider]  nystatin powder Apply 1 Application topically daily. Apply to peri-wound bed, sacrum and right ischium daily with routine dressing.   Yes [provider]  omeprazole (PRILOSEC) 40 MG capsule Take 1 capsule (40 mg total) by mouth 2 (two) times daily. 30 minutes before breakfast/dinner Patient taking differently: Take 40 mg by mouth in the morning and at bedtime. 10/13/22  Yes Mansouraty, Netty Starring., MD  ondansetron (ZOFRAN-ODT) 4 MG disintegrating tablet Take 4 mg by mouth every 8 (eight) hours as needed for nausea or vomiting.   Yes [provider]  oxyCODONE (OXY IR/ROXICODONE) 5 MG immediate release tablet Take 1 tablet (5 mg total) by mouth 2 (two) times daily as needed for breakthrough pain. 06/06/23  Yes Amin, Ankit C, MD  Polyethyl Glycol-Propyl Glycol (GENTEAL TEARS SEVERE DAY/NIGHT) 0.4-0.3 % GEL ophthalmic gel Place 1 Application into both eyes 4 (four) times daily as needed (dry eyes).   Yes [provider]  PRESCRIPTION MEDICATION Inject 10 mg into the vein every 6 (six) months. Rituxan 10/ml  Between the 12-13th of June and December   Yes [provider]  senna-docusate (SENOKOT-S) 8.6-50 MG tablet Take 1 tablet by mouth 2 (two) times daily.   Yes [provider]  traZODone (DESYREL) 50 MG tablet Take 1.5 tablets (75 mg total) by mouth at bedtime. 05/31/23  Yes Rhetta Mura, MD  vitamin C (ASCORBIC ACID) 500 MG tablet Take 500 mg by mouth daily.   Yes [provider]  acetic acid 0.25 % irrigation Irrigate with 1 Application as directed daily. Patient not taking: Reported on 06/30/2023    [provider]  insulin glargine (LANTUS) 100 unit/mL SOPN  Inject 35 Units into the skin 2 (two) times daily. Patient not taking: Reported on 06/30/2023 05/31/23   Rhetta Mura, MD      Allergies    Cipro [ciprofloxacin hcl], Neurontin [gabapentin], Levaquin [levofloxacin], and Penicillins    Review of Systems   Review of Systems  Constitutional:  Negative for chills, fatigue and fever.  Respiratory:  Negative for shortness of breath.   Cardiovascular:  Negative for chest pain.  Gastrointestinal:  Negative for abdominal pain, nausea and vomiting.  Skin:  Positive for wound.  All other systems reviewed and are negative.   Physical Exam Updated Vital Signs BP (!) 156/63   Pulse 67   Temp 97.8 F (36.6 C)   Resp 16   Ht 6' (1.829 m)   Wt 98 kg   SpO2 98%   BMI 29.29  kg/m  Physical Exam Vitals and nursing note reviewed.  Constitutional:      General: He is not in acute distress.    Appearance: Normal appearance. He is not ill-appearing, toxic-appearing or diaphoretic.  HENT:     Head: Normocephalic and atraumatic.     Nose: Nose normal.     Mouth/Throat:     Mouth: Mucous membranes are moist.     Pharynx: Oropharynx is clear.  Eyes:     Extraocular Movements: Extraocular movements intact.     Conjunctiva/sclera: Conjunctivae normal.     Pupils: Pupils are equal, round, and reactive to light.  Cardiovascular:     Rate and Rhythm: Normal rate and regular rhythm.  Pulmonary:     Effort: Pulmonary effort is normal.     Breath sounds: Normal breath sounds. No wheezing.  Abdominal:     General: Abdomen is flat. Bowel sounds are normal.     Palpations: Abdomen is soft.     Tenderness: There is no abdominal tenderness.  Musculoskeletal:     Cervical back: Normal range of motion and neck supple. No tenderness.  Skin:    General: Skin is warm and dry.     Capillary Refill: Capillary refill takes less than 2 seconds.     Comments: Stage IV sacral ulcer, no purulence noted  Neurological:     Mental Status: He is alert and  oriented to person, place, and time.       ED Results / Procedures / Treatments   Labs (all labs ordered are listed, but only abnormal results are displayed) Labs Reviewed  CBC - Abnormal; Notable for the following components:      Result Value   RBC 3.80 (*)    Hemoglobin 10.0 (*)    HCT 32.7 (*)    RDW 15.8 (*)    All other components within normal limits  BASIC METABOLIC PANEL - Abnormal; Notable for the following components:   Glucose, Bld 206 (*)    Calcium 8.5 (*)    All other components within normal limits  URINALYSIS, ROUTINE W REFLEX MICROSCOPIC - Abnormal; Notable for the following components:   APPearance CLOUDY (*)    Protein, ur 30 (*)    Leukocytes,Ua LARGE (*)    Bacteria, UA MANY (*)    All other components within normal limits  I-STAT CG4 LACTIC ACID, ED - Abnormal; Notable for the following components:   Lactic Acid, Venous 2.5 (*)    All other components within normal limits  I-STAT CG4 LACTIC ACID, ED - Abnormal; Notable for the following components:   Lactic Acid, Venous 2.2 (*)    All other components within normal limits  I-STAT CG4 LACTIC ACID, ED - Abnormal; Notable for the following components:   Lactic Acid, Venous 2.3 (*)    All other components within normal limits  CULTURE, BLOOD (ROUTINE X 2)  CULTURE, BLOOD (ROUTINE X 2)  CBC  CREATININE, SERUM  APTT  PROTIME-INR  BASIC METABOLIC PANEL  CBC    EKG None  Radiology CT ABDOMEN PELVIS W CONTRAST  Result Date: 06/30/2023 CLINICAL DATA:  Sepsis. History of sacral and right ischial decubitus ulcers. 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 EXAM: CT ABDOMEN AND PELVIS WITH CONTRAST TECHNIQUE: Multidetector CT imaging of the abdomen and pelvis was performed using the standard protocol following bolus administration of intravenous contrast. RADIATION DOSE REDUCTION: This exam was performed according to the departmental dose-optimization program which includes  automated exposure control, adjustment  of the mA and/or kV according to patient size and/or use of iterative reconstruction technique. CONTRAST:  OMNIPAQUE IOHEXOL 300 MG/ML  SOLN COMPARISON:  06/05/2023 FINDINGS: Lower chest: Stable bibasilar scarring. Hepatobiliary: No focal liver abnormality is seen. Status post cholecystectomy. No biliary dilatation. Pancreas: Unremarkable. No pancreatic ductal dilatation or surrounding inflammatory changes. Spleen: Normal in size without focal abnormality. Adrenals/Urinary Tract: Adrenal glands are unremarkable. Stable appearance of kidneys including stable densely calcified cyst of the upper pole of the left kidney and benign Bosniak 1 cyst of the medial left kidney. The bladder is decompressed by a stable suprapubic catheter. Stomach/Bowel: No bowel obstruction or ileus. Stable appearance of left lower quadrant colostomy. Stable appearance of stomach after repaired gastrocolic fistula at the level of the body of the stomach without evidence of recurrent fistula. No free intraperitoneal air. The appendix is normal. Vascular/Lymphatic: Stable atherosclerosis of the abdominal aorta and iliac arteries without aneurysm. No lymphadenopathy identified. Reproductive: Prostate is unremarkable. Other: Stable right-sided posterior ischial decubitus ulcer that nearly extends to the bony surface of the posterior ischial tuberosity. Chronic sclerosis of the ischial tuberosity. No fluid collections. Minimal skin thickening overlying the sacrum without significant ulceration. Musculoskeletal: No fractures. Stable fused appearance of the L4 and L5 vertebral bodies. IMPRESSION: 1. No acute findings in the abdomen or pelvis. 2. Stable appearance of the stomach after repaired gastrocolic fistula at the level of the body of the stomach without evidence of recurrent fistula. 3. Stable right-sided posterior ischial decubitus ulcer that nearly extends to the bony surface of the posterior  ischial tuberosity. Chronic sclerosis of the ischial tuberosity. No fluid collections. 4. Minimal skin thickening overlying the sacrum without significant ulceration. 5. Stable appearance of left lower quadrant colostomy. 6. Stable suprapubic catheter. 7. Stable densely calcified cyst of the upper pole of the left kidney and benign Bosniak 1 cyst of the medial left kidney. 8. Aortic atherosclerosis. Aortic Atherosclerosis (ICD10-I70.0). Electronically Signed   By: Irish Lack M.D.   On: 06/30/2023 16:04   DG Foot Complete Right  Result Date: 06/30/2023 CLINICAL DATA:  Plantar foot infection EXAM: RIGHT FOOT COMPLETE - 4 VIEW COMPARISON:  None Available. FINDINGS: Extensive soft tissue swelling about the foot. Tiny plantar and Achilles calcaneal spurs. Severe osteopenia. There is some soft tissue gas seen about the fifth digit. There is underlying bony erosive changes involving the head of fifth metatarsal. With patient's provided history and appearance an area of osteomyelitis is possible. No fracture or dislocation. IMPRESSION: Severe osteopenia and soft tissue swelling with soft tissue gas and thickening about the fifth digit near the fifth metatarsophalangeal joint with underlying erosive changes suggested along the fifth metatarsal head. An area of bone infection is possible. Further workup as clinically appropriate such as MRI or bone scan to further delineate Electronically Signed   By: Karen Kays M.D.   On: 06/30/2023 15:02    Procedures Procedures   Medications Ordered in ED Medications  lactated ringers infusion ( Intravenous New Bag/Given 06/30/23 1420)  vancomycin (VANCOREADY) IVPB 1500 mg/300 mL (1,500 mg Intravenous New Bag/Given 06/30/23 1633)  Gerhardt's butt cream ( Topical Given 06/30/23 1614)  enoxaparin (LOVENOX) injection 40 mg (has no administration in time range)  acetaminophen (TYLENOL) tablet 650 mg (has no administration in time range)    Or  acetaminophen (TYLENOL)  suppository 650 mg (has no administration in time range)  polyethylene glycol (MIRALAX / GLYCOLAX) packet 17 g (has no administration in time range)  sodium chloride flush (NS)  0.9 % injection 3 mL (has no administration in time range)  iohexol (OMNIPAQUE) 300 MG/ML solution 100 mL (100 mLs Intravenous Contrast Given 06/30/23 1400)  cefTRIAXone (ROCEPHIN) 1 g in sodium chloride 0.9 % 100 mL IVPB (0 g Intravenous Stopped 06/30/23 1546)    ED Course/ Medical Decision Making/ A&P Clinical Course as of 06/30/23 1727  Fri Jun 30, 2023  1238 Wound care nurse wanted patient sent out, elevated wbc count [CG]  [phone], jennifer [CG]  1241 Stage IV sacral ulcer, stage IV ischium, osteomyelitis confirmed through bone culture, infectious disease patient on 10/17, last week sacral wound began deteriorating rapidly, confusion, hallucinations, intermittently throughout the day, can normally carry out conversation but is having nonsensical conversations, called 911 one day last week because he wanted to get out of bed to meet his wife at 230 in the morning. [CG]  1243 Tried to remove peg tube and it began to ooze stomach contects, IR found multiple fistulas, abscess to RUQ [CG]  1244 Has green purulent drainage from rectum noticed yesterday [CG]  1547 Ever since stomach surgery, has had delirium, fluctuating, worse in the evening. Poor oral intake. [CG]    Clinical Course User Index [CG] Al Decant, PA-C    Medical Decision Making Amount and/or Complexity of Data Reviewed Labs: ordered. Radiology: ordered.   77 year old male presents to the ED for evaluation.  Please see HPI for further details.  On examination patient is afebrile and nontachycardic.  His lung sounds are clear bilaterally, he is not hypoxic.  His abdomen is soft and compressible throughout.  Neurological examination is at baseline.  Patient does have stage IV sacral ulcer.  No purulence noted.  Wound care was  consulted and provided instructions for nursing to care for this and redress the wound.  Patient CBC with a leukocytosis, hemoglobin of 10 which is baseline.  The patient lactic acid was elevated at 2.2, 2.3 with repeat.  Patient was started on LR infusion at this time.  Patient has no recent echo to evaluate ejection fraction.  Metabolic panel shows glucose 206, no other electrolyte derangement.  Urinalysis unremarkable.  X-ray of patient right such as osteomyelitis.  CT scan of abdomen and pelvis shows chronic sacral wound with no fluid.  Patient started on vancomycin, ceftriaxone at this time.  Patient will require admission for continued antibiotics as patient has failed outpatient.  Patient discussed with Dr. Maryjean Ka who has agreed to admit patient.  Patient stable at time of admission.   Final Clinical Impression(s) / ED Diagnoses Final diagnoses:  Wound infection  Skin ulcer of sacrum with necrosis of muscle (HCC)  Osteomyelitis of right foot, unspecified type [facility])    Rx / DC Orders ED Discharge Orders     None         Al Decant, PA-C 06/30/23 1727    Coral Spikes, DO 07/05/23 [phone]

## 2023-07-01 ENCOUNTER — Encounter (HOSPITAL_COMMUNITY): Payer: Self-pay | Admitting: Internal Medicine

## 2023-07-01 DIAGNOSIS — M00871 Arthritis due to other bacteria, right ankle and foot: Secondary | ICD-10-CM | POA: Diagnosis present

## 2023-07-01 DIAGNOSIS — Z9359 Other cystostomy status: Secondary | ICD-10-CM | POA: Diagnosis not present

## 2023-07-01 DIAGNOSIS — M86171 Other acute osteomyelitis, right ankle and foot: Secondary | ICD-10-CM | POA: Diagnosis present

## 2023-07-01 DIAGNOSIS — L89154 Pressure ulcer of sacral region, stage 4: Secondary | ICD-10-CM | POA: Diagnosis present

## 2023-07-01 DIAGNOSIS — D631 Anemia in chronic kidney disease: Secondary | ICD-10-CM | POA: Diagnosis present

## 2023-07-01 DIAGNOSIS — L8990 Pressure ulcer of unspecified site, unspecified stage: Secondary | ICD-10-CM | POA: Diagnosis present

## 2023-07-01 DIAGNOSIS — Z794 Long term (current) use of insulin: Secondary | ICD-10-CM

## 2023-07-01 DIAGNOSIS — E119 Type 2 diabetes mellitus without complications: Secondary | ICD-10-CM

## 2023-07-01 DIAGNOSIS — E1169 Type 2 diabetes mellitus with other specified complication: Secondary | ICD-10-CM | POA: Diagnosis present

## 2023-07-01 DIAGNOSIS — L8944 Pressure ulcer of contiguous site of back, buttock and hip, stage 4: Secondary | ICD-10-CM

## 2023-07-01 DIAGNOSIS — M009 Pyogenic arthritis, unspecified: Secondary | ICD-10-CM

## 2023-07-01 DIAGNOSIS — E871 Hypo-osmolality and hyponatremia: Secondary | ICD-10-CM | POA: Diagnosis not present

## 2023-07-01 DIAGNOSIS — G8250 Quadriplegia, unspecified: Secondary | ICD-10-CM | POA: Diagnosis present

## 2023-07-01 DIAGNOSIS — G8220 Paraplegia, unspecified: Secondary | ICD-10-CM

## 2023-07-01 DIAGNOSIS — E11621 Type 2 diabetes mellitus with foot ulcer: Secondary | ICD-10-CM | POA: Diagnosis present

## 2023-07-01 DIAGNOSIS — L97519 Non-pressure chronic ulcer of other part of right foot with unspecified severity: Secondary | ICD-10-CM | POA: Diagnosis present

## 2023-07-01 DIAGNOSIS — L97509 Non-pressure chronic ulcer of other part of unspecified foot with unspecified severity: Secondary | ICD-10-CM | POA: Diagnosis not present

## 2023-07-01 DIAGNOSIS — M879 Osteonecrosis, unspecified: Secondary | ICD-10-CM | POA: Diagnosis present

## 2023-07-01 DIAGNOSIS — E781 Pure hyperglyceridemia: Secondary | ICD-10-CM | POA: Diagnosis present

## 2023-07-01 DIAGNOSIS — M869 Osteomyelitis, unspecified: Secondary | ICD-10-CM | POA: Diagnosis not present

## 2023-07-01 DIAGNOSIS — Z933 Colostomy status: Secondary | ICD-10-CM

## 2023-07-01 DIAGNOSIS — J9811 Atelectasis: Secondary | ICD-10-CM | POA: Diagnosis not present

## 2023-07-01 DIAGNOSIS — E876 Hypokalemia: Secondary | ICD-10-CM | POA: Diagnosis not present

## 2023-07-01 DIAGNOSIS — I2510 Atherosclerotic heart disease of native coronary artery without angina pectoris: Secondary | ICD-10-CM | POA: Diagnosis present

## 2023-07-01 DIAGNOSIS — L89893 Pressure ulcer of other site, stage 3: Secondary | ICD-10-CM | POA: Diagnosis present

## 2023-07-01 DIAGNOSIS — N319 Neuromuscular dysfunction of bladder, unspecified: Secondary | ICD-10-CM

## 2023-07-01 DIAGNOSIS — I10 Essential (primary) hypertension: Secondary | ICD-10-CM | POA: Diagnosis present

## 2023-07-01 DIAGNOSIS — E872 Acidosis, unspecified: Secondary | ICD-10-CM | POA: Diagnosis present

## 2023-07-01 DIAGNOSIS — G373 Acute transverse myelitis in demyelinating disease of central nervous system: Secondary | ICD-10-CM | POA: Diagnosis present

## 2023-07-01 DIAGNOSIS — T148XXA Other injury of unspecified body region, initial encounter: Secondary | ICD-10-CM | POA: Diagnosis present

## 2023-07-01 DIAGNOSIS — I700 Atherosclerosis of aorta: Secondary | ICD-10-CM | POA: Insufficient documentation

## 2023-07-01 HISTORY — DX: Pyogenic arthritis, unspecified: M00.9

## 2023-07-01 HISTORY — DX: Other acute osteomyelitis, right ankle and foot: M86.171

## 2023-07-01 HISTORY — DX: Atherosclerosis of aorta: I70.0

## 2023-07-01 LAB — PROTIME-INR
INR: 1.2 (ref 0.8–1.2)
Prothrombin Time: 15.7 s — ABNORMAL HIGH (ref 11.4–15.2)

## 2023-07-01 LAB — GLUCOSE, CAPILLARY
Glucose-Capillary: 155 mg/dL — ABNORMAL HIGH (ref 70–99)
Glucose-Capillary: 202 mg/dL — ABNORMAL HIGH (ref 70–99)
Glucose-Capillary: 158 mg/dL — ABNORMAL HIGH (ref 70–99)
Glucose-Capillary: 103 mg/dL — ABNORMAL HIGH (ref 70–99)

## 2023-07-01 LAB — BASIC METABOLIC PANEL
Anion gap: 10 (ref 5–15)
BUN: 12 mg/dL (ref 8–23)
CO2: 18 mmol/L — ABNORMAL LOW (ref 22–32)
Calcium: 7.7 mg/dL — ABNORMAL LOW (ref 8.9–10.3)
Chloride: 106 mmol/L (ref 98–111)
Creatinine, Ser: 0.41 mg/dL — ABNORMAL LOW (ref 0.61–1.24)
GFR, Estimated: >60 mL/min (ref >60)
Glucose, Bld: 207 mg/dL — ABNORMAL HIGH (ref 70–99)
Potassium: 3.9 mmol/L (ref 3.5–5.1)
Sodium: 134 mmol/L — ABNORMAL LOW (ref 135–145)

## 2023-07-01 LAB — C-REACTIVE PROTEIN: CRP: 12.6 mg/dL — ABNORMAL HIGH (ref <1.0)

## 2023-07-01 LAB — CBC
HCT: 26.7 % — ABNORMAL LOW (ref 39.0–52.0)
Hemoglobin: 8.1 g/dL — ABNORMAL LOW (ref 13.0–17.0)
MCH: 26.4 pg (ref 26.0–34.0)
MCHC: 30.3 g/dL (ref 30.0–36.0)
MCV: 87 fL (ref 80.0–100.0)
Platelets: 299 10*3/uL (ref 150–400)
RBC: 3.07 MIL/uL — ABNORMAL LOW (ref 4.22–5.81)
RDW: 15.9 % — ABNORMAL HIGH (ref 11.5–15.5)
WBC: 9.6 10*3/uL (ref 4.0–10.5)
nRBC: 0 % (ref 0.0–0.2)

## 2023-07-01 LAB — APTT: aPTT: 44 s — ABNORMAL HIGH (ref 24–36)

## 2023-07-01 LAB — SEDIMENTATION RATE: Sed Rate: 71 mm/h — ABNORMAL HIGH (ref 0–16)

## 2023-07-01 LAB — PREALBUMIN: Prealbumin: 10 mg/dL — ABNORMAL LOW (ref 18–38)

## 2023-07-01 LAB — LACTIC ACID, PLASMA: Lactic Acid, Venous: 5.8 mmol/L (ref 0.5–1.9)

## 2023-07-01 MED ORDER — METRONIDAZOLE 500 MG PO TABS
500.0000 mg | ORAL_TABLET | Freq: Two times a day (BID) | ORAL | Status: DC
  Administered 2023-07-01 – 2023-07-04 (×6): 500 mg via ORAL
  Filled 2023-07-01 (×6): qty 1

## 2023-07-01 MED ORDER — SODIUM CHLORIDE 0.9 % IV SOLN
2.0000 g | INTRAVENOUS | Status: DC
  Administered 2023-07-02 – 2023-07-03 (×3): 2 g via INTRAVENOUS
  Filled 2023-07-01 (×3): qty 20

## 2023-07-01 NOTE — Progress Notes (Signed)
Progress Note   Patient: Brent [name]. KZS:[id number] DOB: Feb 21, 1946 DOA: 06/30/2023     0 DOS: the patient was seen and examined on 07/01/2023   Brief hospital course: 77 year old man complex PMH including paraplegia secondary to transverse myelitis, GI fistula, neurogenic bladder status post suprapubic catheter, stage IV decubitus ulcer, colostomy who was sent to the emergency department for evaluation of sacral ulcer and right foot wound, on treatment with ceftriaxone as an outpatient.  He was admitted for suspected right foot osteomyelitis as well as further evaluation of sacral decubitus.  Other initial findings included mild lactic acidosis.  Consultants Orthopedics   Procedures None  Assessment and Plan: Diabetic right foot ulcer Acute osteomyelitis right fifth metatarsal Septic arthritis fifth MTP joint MRI fifth MTP joint septic arthritis and osteomyelitis of the fifth metatarsal and fifth proximal phalanx.   Empiric antibiotics given lactic acidosis on admission.  Clinically the patient appears stable. Orthopedic consultation, may end up requiring amputation  *Stage IV decubitus ulcer Ischial decubitus ulcer Chronic in nature.  No evidence on admission for infection.  CT imaging showed stable right sided ischial decubitus ulcer, no fluid collections. Wound care consultation  Lactic acidosis Elevated lactic acid, he has no signs or symptoms to suggest for sepsis and appears clinically stable.  I am puzzled by the most recent lactic acid from 3 AM of 5.8.  Clinically this does not fit with his current status.  Possibly related to metformin Given clinical stability I do not see much advantage to obtaining   Paraplegia secondary to transverse myelitis Neurogenic bladder status post suprapubic catheter  Pyuria Suprapubic catheter Monitor clinically  Repaired gastrocolic fistula Colostomy  DM type 2 CBG stable.  Continue Semglee, sliding scale insulin.  Hold  metformin while hospitalized  CAD Continue atenolol, atorvastatin  Aortic atherosclerosis Continue statin.   Subjective:  Feels ok No pain Breathing ok  Physical Exam: Vitals:   06/30/23 1804 06/30/23 2201 07/01/23 0132 07/01/23 0526  BP: (!) 166/63 (!) 122/53 (!) 108/49 (!) 97/45  Pulse: (!) 58 61 63 62  Resp: 18 17 18 17   Temp: 98 F (36.7 C) 97.9 F (36.6 C) (!) 97.5 F (36.4 C) 97.8 F (36.6 C)  TempSrc: Oral Oral  Oral  SpO2: 100% 100% 99% 98%  Weight:      Height:       Physical Exam Vitals reviewed.  Constitutional:      General: He is not in acute distress.    Appearance: He is not ill-appearing or toxic-appearing.  Cardiovascular:     Rate and Rhythm: Normal rate and regular rhythm.     Heart sounds: No murmur heard. Pulmonary:     Effort: Pulmonary effort is normal. No respiratory distress.     Breath sounds: No wheezing, rhonchi or rales.  Musculoskeletal:     Right lower leg: No edema.     Left lower leg: No edema.  Skin:    Comments: Large right plantar foot wound as pictured in H&P with some drainage Right posterior calf with minor erythema   Neurological:     Mental Status: He is alert.  Psychiatric:        Mood and Affect: Mood normal.        Behavior: Behavior normal.     Data Reviewed: CBG stable LA 2.5 > 2.2 > 2.3 > 5.8 BMP noted MR foot noted  Family Communication: none  Disposition: Status is: Observation      Time spent: 35 minutes  Author: Brendia Sacks, MD 07/01/2023 1:26 PM  For on call review [email or web address].

## 2023-07-01 NOTE — Consult Note (Addendum)
Reason for Consult: Right foot ulcer Referring Physician: Per Beagley. is an 77 y.o. male.  HPI: 77 year old male who is a paraplegic secondary to transverse myelitis who has had a right foot pressure ulcer in addition to a chronic sacral decubitus ulcer over the past 2 weeks.  He was admitted and placed on ceftriaxone.  MRI was ordered indicating osteomyelitis and a probable septic arthritis.  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 angioplasty of his RCA in 1990. b. bare metal stent placed in the RCA in 2000 followed by rotational atherectomy shortly after for stent restenosis. c. last cath was in 2012 showing stable moderate diffuse CAD. (70% mid LAD, 80% diagonal, 70% Ramus, 40% mid to distal RCA stent restenosis). d. Low risk nuc in 2015.   Diabetes mellitus    Diverticulosis    Elevated CK    Erectile dysfunction    Hemorrhoids    HTN (hypertension)    Hyperlipidemia    Hypertriglyceridemia    Malignant melanoma of left side of neck (HCC) 10/25/2018   Myocardial infarction (HCC)    Obesity    OSA (obstructive sleep apnea)    Persistent disorder of initiating or maintaining sleep    Personal history of colonic polyps 02/05/2003   Renal lesion 06/20/2016    Past Surgical History:  Procedure Laterality Date   BIOPSY  03/26/2022   Procedure: BIOPSY;  Surgeon: Iva Boop, MD;  Location: [facility] ENDOSCOPY;  Service: Gastroenterology;;   CHOLECYSTECTOMY     CORONARY ANGIOPLASTY     CORONARY STENT PLACEMENT     stenting of the right coronary artery with followup rotational  atherectomy. (3 stents placed)   ENDOSCOPIC MUCOSAL RESECTION  10/13/2022   Procedure: ENDOSCOPIC MUCOSAL RESECTION;  Surgeon: Meridee Score Netty Starring., MD;  Location: [facility] ENDOSCOPY;  Service: Gastroenterology;;   ESOPHAGOGASTRODUODENOSCOPY (EGD) WITH PROPOFOL N/A 03/26/2022   Procedure:  ESOPHAGOGASTRODUODENOSCOPY (EGD) WITH PROPOFOL;  Surgeon: Iva Boop, MD;  Location: [facility] ENDOSCOPY;  Service: Gastroenterology;  Laterality: N/A;   ESOPHAGOGASTRODUODENOSCOPY (EGD) WITH PROPOFOL N/A 07/11/2022   Procedure: ESOPHAGOGASTRODUODENOSCOPY (EGD) WITH PROPOFOL;  Surgeon: Meridee Score Netty Starring., MD;  Location: [facility] ENDOSCOPY;  Service: Gastroenterology;  Laterality: N/A;   ESOPHAGOGASTRODUODENOSCOPY (EGD) WITH PROPOFOL N/A 10/13/2022   Procedure: ESOPHAGOGASTRODUODENOSCOPY (EGD) WITH PROPOFOL;  Surgeon: Meridee Score Netty Starring., MD;  Location: [facility] ENDOSCOPY;  Service: Gastroenterology;  Laterality: N/A;   EUS N/A 10/13/2022   Procedure: UPPER ENDOSCOPIC ULTRASOUND (EUS) RADIAL;  Surgeon: Lemar Lofty., MD;  Location: [facility] ENDOSCOPY;  Service: Gastroenterology;  Laterality: N/A;   FINGER SURGERY     right   FOOT SURGERY     right   HEMOSTASIS CLIP PLACEMENT  07/11/2022   Procedure: HEMOSTASIS CLIP PLACEMENT;  Surgeon: Meridee Score Netty Starring., MD;  Location: [facility] ENDOSCOPY;  Service: Gastroenterology;;  ovesco    HEMOSTASIS CLIP PLACEMENT  10/13/2022   Procedure: HEMOSTASIS CLIP PLACEMENT;  Surgeon: Lemar Lofty., MD;  Location: [facility] ENDOSCOPY;  Service: Gastroenterology;;   HOT HEMOSTASIS N/A 07/11/2022   Procedure: HOT HEMOSTASIS (ARGON PLASMA COAGULATION/BICAP);  Surgeon: Lemar Lofty., MD;  Location: [facility] ENDOSCOPY;  Service: Gastroenterology;  Laterality: N/A;   HOT HEMOSTASIS N/A 10/13/2022   Procedure: HOT HEMOSTASIS (ARGON PLASMA COAGULATION/BICAP);  Surgeon: Lemar Lofty., MD;  Location: [facility] ENDOSCOPY;  Service: Gastroenterology;  Laterality: N/A;   INGUINAL HERNIA REPAIR  right   IR FLUORO PROCEDURE UNLISTED  02/21/2023   IR GASTROSTOMY TUBE REMOVAL  02/10/2022   IR REPLACE G-TUBE SIMPLE WO FLUORO  03/14/2023   IR REPLC GASTRO/COLONIC TUBE PERCUT W/FLUORO  12/22/2022   IR REPLC GASTRO/COLONIC TUBE PERCUT W/FLUORO  01/26/2023   IR REPLC GASTRO/COLONIC  TUBE PERCUT W/FLUORO  03/06/2023   IR REPLC GASTRO/COLONIC TUBE PERCUT W/FLUORO  04/27/2023   IRRIGATION AND DEBRIDEMENT ABSCESS N/A 06/04/2020   Procedure: IRRIGATION AND DEBRIDEMENT SACRAL WOUND;  Surgeon: Diamantina Monks, MD;  Location: [facility] OR;  Service: General;  Laterality: N/A;   LAPAROSCOPY N/A 06/04/2020   Procedure: LAPAROSCOPY ASSISTED COLOSTOMY, LAPAROSCOPY GASTROSTOMY;  Surgeon: Diamantina Monks, MD;  Location: [facility] OR;  Service: General;  Laterality: N/A;   LAPAROSCOPY N/A 05/24/2023   Procedure: LAPAROSCOPY DIAGNOSTIC;  Surgeon: Emelia Loron, MD;  Location: [facility] OR;  Service: General;  Laterality: N/A;   LAPAROTOMY N/A 05/24/2023   Procedure: WEDGED RESECTION OF STOMACH AND GASTROCUTANEOUS FISTULA;  Surgeon: Emelia Loron, MD;  Location: [facility] OR;  Service: General;  Laterality: N/A;   LYSIS OF ADHESION N/A 06/04/2020   Procedure: LYSIS OF ADHESION;  Surgeon: Diamantina Monks, MD;  Location: [facility] OR;  Service: General;  Laterality: N/A;   LYSIS OF ADHESION N/A 05/24/2023   Procedure: LYSIS OF ADHESION;  Surgeon: Emelia Loron, MD;  Location: [facility] OR;  Service: General;  Laterality: N/A;   melanoma removal     neck   ORTHOPEDIC SURGERY     foot right   POLYPECTOMY  10/13/2022   Procedure: POLYPECTOMY;  Surgeon: Mansouraty, Netty Starring., MD;  Location: [facility] ENDOSCOPY;  Service: Gastroenterology;;   rotator cuff surg     Bil   STENT REMOVAL  10/13/2022   Procedure: CLIP REMOVAL;  Surgeon: Lemar Lofty., MD;  Location: [facility] ENDOSCOPY;  Service: Gastroenterology;;   SUBMUCOSAL LIFTING INJECTION  10/13/2022   Procedure: SUBMUCOSAL LIFTING INJECTION;  Surgeon: Lemar Lofty., MD;  Location: [facility] ENDOSCOPY;  Service: Gastroenterology;;   SUBMUCOSAL TATTOO INJECTION  10/13/2022   Procedure: SUBMUCOSAL TATTOO INJECTION;  Surgeon: Lemar Lofty., MD;  Location: [facility] ENDOSCOPY;  Service: Gastroenterology;;    Family History  Problem Relation Age of Onset   Lung cancer Mother     COPD Mother    Obesity Mother    Liver cancer Father    Lung cancer Father    Heart disease Father    Heart disease Sister    Colon cancer Neg Hx    Stomach cancer Neg Hx    Pancreatic cancer Neg Hx    Esophageal cancer Neg Hx    Inflammatory bowel disease Neg Hx    Liver disease Neg Hx    Rectal cancer Neg Hx     Social History:  reports that he quit smoking about 42 years ago. His smoking use included cigarettes. He started smoking about 72 years ago. He has a 45 pack-year smoking history. He has never used smokeless tobacco. He reports that he does not currently use alcohol after a past usage of about 7.0 - 14.0 standard drinks of alcohol per week. He reports that he does not use drugs.  Allergies:  Allergies  Allergen Reactions   Cipro [Ciprofloxacin Hcl] Other (See Comments)    Hallucinations Allergy not listed on MAR   Neurontin [Gabapentin] Other (See Comments)    Unknown reaction   Levaquin [Levofloxacin] Rash   Penicillins Hives and Rash    Tolerated Unasyn, Augmentin, cefepime and cephalexin  Medications: I have reviewed the patient's current medications.  Results for orders placed or performed during the hospital encounter of 06/30/23 (from the past 48 hour(s))  CBC     Status: Abnormal   Collection Time: 06/30/23 12:43 PM  Result Value Ref Range   WBC 9.4 4.0 - 10.5 K/uL   RBC 3.80 (L) 4.22 - 5.81 MIL/uL   Hemoglobin 10.0 (L) 13.0 - 17.0 g/dL   HCT 16.1 (L) 09.6 - 04.5 %   MCV 86.1 80.0 - 100.0 fL   MCH 26.3 26.0 - 34.0 pg   MCHC 30.6 30.0 - 36.0 g/dL   RDW 40.9 (H) 81.1 - 91.4 %   Platelets 333 150 - 400 K/uL   nRBC 0.0 0.0 - 0.2 %    Comment: Performed at [facility], [address], [place], [place] [address]  Basic metabolic panel     Status: Abnormal   Collection Time: 06/30/23 12:43 PM  Result Value Ref Range   Sodium 136 135 - 145 mmol/L   Potassium 4.0 3.5 - 5.1 mmol/L   Chloride 102 98 - 111 mmol/L   CO2 23 22 - 32  mmol/L   Glucose, Bld 206 (H) 70 - 99 mg/dL    Comment: Glucose reference range applies only to samples taken after fasting for at least 8 hours.   BUN 18 8 - 23 mg/dL   Creatinine, Ser 6.21 0.61 - 1.24 mg/dL   Calcium 8.5 (L) 8.9 - 10.3 mg/dL   GFR, Estimated >30 >86 mL/min    Comment: (NOTE) Calculated using the CKD-EPI Creatinine Equation (2021)    Anion gap 11 5 - 15    Comment: Performed at [facility], [address] [place], [place] [address]  Blood culture (routine x 2)     Status: None (Preliminary result)   Collection Time: 06/30/23 12:45 PM   Specimen: Site Not Specified; Blood  Result Value Ref Range   Specimen Description      SITE NOT SPECIFIED Performed at [facility], [address] [place], [place] [address]    Special Requests      BOTTLES DRAWN AEROBIC AND ANAEROBIC Blood Culture adequate volume Performed at [facility], [address], [place], [place] [address]    Culture      NO GROWTH < 24 HOURS Performed at [facility], [address] [place], [place] [address]    Report Status PENDING   Urinalysis, Routine w reflex microscopic -Urine, Catheterized     Status: Abnormal   Collection Time: 06/30/23 12:46 PM  Result Value Ref Range   Color, Urine YELLOW YELLOW   APPearance CLOUDY (A) CLEAR   Specific Gravity, Urine 1.011 1.005 - 1.030   pH 5.0 5.0 - 8.0   Glucose, UA NEGATIVE NEGATIVE mg/dL   Hgb urine dipstick NEGATIVE NEGATIVE   Bilirubin Urine NEGATIVE NEGATIVE   Ketones, ur NEGATIVE NEGATIVE mg/dL   Protein, ur 30 (A) NEGATIVE mg/dL   Nitrite NEGATIVE NEGATIVE   Leukocytes,Ua LARGE (A) NEGATIVE   RBC / HPF 6-10 0 - 5 RBC/hpf   WBC, UA >50 0 - 5 WBC/hpf   Bacteria, UA MANY (A) NONE SEEN   Squamous Epithelial / HPF 0-5 0 - 5 /HPF   WBC Clumps PRESENT    Mucus PRESENT    Hyaline Casts, UA PRESENT     Comment: Performed at [facility], [address],  [place], [place] [address]  Blood culture (routine x 2)     Status: None (Preliminary result)   Collection Time: 06/30/23 12:49 PM   Specimen: BLOOD RIGHT FOREARM  Result Value Ref Range   Specimen Description      BLOOD RIGHT FOREARM Performed at [facility], [address] [place], [place] [address]    Special Requests      BOTTLES DRAWN AEROBIC AND ANAEROBIC Blood Culture adequate volume Performed at [facility], [address], [place], [place] [address]    Culture      NO GROWTH < 24 HOURS Performed at [facility], [address], [place], [place] [address]    Report Status PENDING   I-Stat CG4 Lactic Acid     Status: Abnormal   Collection Time: 06/30/23 12:55 PM  Result Value Ref Range   Lactic Acid, Venous 2.5 (HH) 0.5 - 1.9 mmol/L   Comment NOTIFIED PHYSICIAN   I-Stat CG4 Lactic Acid     Status: Abnormal   Collection Time: 06/30/23  2:30 PM  Result Value Ref Range   Lactic Acid, Venous 2.2 (HH) 0.5 - 1.9 mmol/L  I-Stat CG4 Lactic Acid, ED     Status: Abnormal   Collection Time: 06/30/23  2:37 PM  Result Value Ref Range   Lactic Acid, Venous 2.3 (HH) 0.5 - 1.9 mmol/L   Comment NOTIFIED PHYSICIAN   CBC     Status: Abnormal   Collection Time: 06/30/23  6:41 PM  Result Value Ref Range   WBC 13.2 (H) 4.0 - 10.5 K/uL   RBC 3.81 (L) 4.22 - 5.81 MIL/uL   Hemoglobin 10.0 (L) 13.0 - 17.0 g/dL   HCT 38.7 (L) 56.4 - 33.2 %   MCV 86.4 80.0 - 100.0 fL   MCH 26.2 26.0 - 34.0 pg   MCHC 30.4 30.0 - 36.0 g/dL   RDW 95.1 (H) 88.4 - 16.6 %   Platelets 353 150 - 400 K/uL   nRBC 0.0 0.0 - 0.2 %    Comment: Performed at [facility], [address] [place], [place] [address]  Creatinine, serum     Status: Abnormal   Collection Time: 06/30/23  6:41 PM  Result Value Ref Range   Creatinine, Ser 0.59 (L) 0.61 - 1.24 mg/dL   GFR, Estimated >60 >10 mL/min    Comment: (NOTE) Calculated using the CKD-EPI Creatinine Equation  (2021) Performed at [facility], [address], [place], [place] [address]   Troponin I (High Sensitivity)     Status: None   Collection Time: 06/30/23  6:41 PM  Result Value Ref Range   Troponin I (High Sensitivity) 4 <18 ng/L    Comment: (NOTE) Elevated high sensitivity troponin I (hsTnI) values and significant  changes across serial measurements may suggest ACS but many other  chronic and acute conditions are known to elevate hsTnI results.  Refer to the "Links" section for chest pain algorithms and additional  guidance. Performed at [facility], [address], [place], [place] [address]   Glucose, capillary     Status: Abnormal   Collection Time: 06/30/23  6:41 PM  Result Value Ref Range   Glucose-Capillary 178 (H) 70 - 99 mg/dL    Comment: Glucose reference range applies only to samples taken after fasting for at least 8 hours.  Glucose, capillary     Status: Abnormal   Collection Time: 06/30/23  9:58 PM  Result Value Ref Range   Glucose-Capillary  171 (H) 70 - 99 mg/dL    Comment: Glucose reference range applies only to samples taken after fasting for at least 8 hours.  APTT     Status: Abnormal   Collection Time: 07/01/23  3:15 AM  Result Value Ref Range   aPTT 44 (H) 24 - 36 seconds    Comment:        IF BASELINE aPTT IS ELEVATED, SUGGEST PATIENT RISK ASSESSMENT BE USED TO DETERMINE APPROPRIATE ANTICOAGULANT THERAPY. Performed at [facility], [address] [place], [place] [address]   Protime-INR     Status: Abnormal   Collection Time: 07/01/23  3:15 AM  Result Value Ref Range   Prothrombin Time 15.7 (H) 11.4 - 15.2 seconds   INR 1.2 0.8 - 1.2    Comment: (NOTE) INR goal varies based on device and disease states. Performed at [facility], [address], [place], [place] [address]   Basic metabolic panel     Status: Abnormal   Collection Time: 07/01/23  3:15 AM  Result Value  Ref Range   Sodium 134 (L) 135 - 145 mmol/L   Potassium 3.9 3.5 - 5.1 mmol/L   Chloride 106 98 - 111 mmol/L   CO2 18 (L) 22 - 32 mmol/L   Glucose, Bld 207 (H) 70 - 99 mg/dL    Comment: Glucose reference range applies only to samples taken after fasting for at least 8 hours.   BUN 12 8 - 23 mg/dL   Creatinine, Ser 6.44 (L) 0.61 - 1.24 mg/dL   Calcium 7.7 (L) 8.9 - 10.3 mg/dL   GFR, Estimated >03 >47 mL/min    Comment: (NOTE) Calculated using the CKD-EPI Creatinine Equation (2021)    Anion gap 10 5 - 15    Comment: Performed at [facility], [address] [place], [place] [address]  CBC     Status: Abnormal   Collection Time: 07/01/23  3:15 AM  Result Value Ref Range   WBC 9.6 4.0 - 10.5 K/uL   RBC 3.07 (L) 4.22 - 5.81 MIL/uL   Hemoglobin 8.1 (L) 13.0 - 17.0 g/dL   HCT 63.8 (L) 75.6 - 43.3 %   MCV 87.0 80.0 - 100.0 fL   MCH 26.4 26.0 - 34.0 pg   MCHC 30.3 30.0 - 36.0 g/dL   RDW 29.5 (H) 18.8 - 41.6 %   Platelets 299 150 - 400 K/uL   nRBC 0.0 0.0 - 0.2 %    Comment: Performed at [facility], [address], [place], [place] [address]  Lactic acid, plasma     Status: Abnormal   Collection Time: 07/01/23  3:15 AM  Result Value Ref Range   Lactic Acid, Venous 5.8 (HH) 0.5 - 1.9 mmol/L    Comment: CRITICAL RESULT CALLED TO, READ BACK BY AND VERIFIED WITH BRYSON L. @0434  07/01/23 MCLEAN K. Performed at [facility], [address], [place], [place] [address]   Glucose, capillary     Status: Abnormal   Collection Time: 07/01/23  8:15 AM  Result Value Ref Range   Glucose-Capillary 155 (H) 70 - 99 mg/dL    Comment: Glucose reference range applies only to samples taken after fasting for at least 8 hours.  Glucose, capillary     Status: Abnormal   Collection Time: 07/01/23 12:03 PM  Result Value Ref Range   Glucose-Capillary 202 (H) 70 - 99 mg/dL    Comment: Glucose reference range applies only  to samples taken after  fasting for at least 8 hours.    MR FOOT RIGHT W WO CONTRAST  Result Date: 07/01/2023 CLINICAL DATA:  Right foot ulcer. EXAM: MRI OF THE RIGHT FOREFOOT WITHOUT AND WITH CONTRAST TECHNIQUE: Multiplanar, multisequence MR imaging of the right foot was performed before and after the administration of intravenous contrast. CONTRAST:  10mL MAGNEVIST GADOPENTETATE DIMEGLUMINE 469.01 MG/ML IV SOLN COMPARISON:  Right foot x-rays from same day. FINDINGS: Bones/Joint/Cartilage Abnormal marrow edema and enhancement with corresponding decreased T1 marrow signal and associated bony destruction involving the distal half of the fifth metatarsal, as well as the fifth proximal phalanx. Portions of the fifth metatarsal do not enhance, consistent with osteonecrosis. Remaining marrow signal is normal. No fracture or dislocation. Mild degenerative changes. Large fifth MTP joint effusion. Ligaments The lateral collateral ligament of the fifth MTP joint is torn. Remaining toe collateral ligaments are intact. Intact Lisfranc ligament. Medial and lateral ankle ligaments are grossly intact. Muscles and Tendons No tenosynovitis. Increased T2 signal within and atrophy of the intrinsic muscles of the forefoot, nonspecific, but likely related to diabetic muscle changes. Soft tissue Lateral plantar foot ulcer at the base of the fifth metatarsal head, extending to bone. Small foci of adjacent subcutaneous emphysema. No rim enhancing fluid collection. Prominent dorsal foot soft tissue swelling without enhancement. No soft tissue mass. IMPRESSION: 1. Lateral plantar foot ulcer at the base of the fifth metatarsal head extending to bone, with underlying fifth MTP joint septic arthritis and osteomyelitis of the fifth metatarsal and fifth proximal phalanx. 2. Portions of the infected fifth metatarsal do not enhance, consistent with osteonecrosis. 3. No abscess. Electronically Signed   By: Obie Dredge M.D.   On: 07/01/2023 09:52   CT ABDOMEN  PELVIS W CONTRAST  Result Date: 06/30/2023 CLINICAL DATA:  Sepsis. History of sacral and right ischial decubitus ulcers. 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 EXAM: CT ABDOMEN AND PELVIS WITH CONTRAST TECHNIQUE: Multidetector CT imaging of the abdomen and pelvis was performed using the standard protocol following bolus administration of intravenous contrast. RADIATION DOSE REDUCTION: This exam was performed according to the departmental dose-optimization program which includes automated exposure control, adjustment of the mA and/or kV according to patient size and/or use of iterative reconstruction technique. CONTRAST:  OMNIPAQUE IOHEXOL 300 MG/ML  SOLN COMPARISON:  06/05/2023 FINDINGS: Lower chest: Stable bibasilar scarring. Hepatobiliary: No focal liver abnormality is seen. Status post cholecystectomy. No biliary dilatation. Pancreas: Unremarkable. No pancreatic ductal dilatation or surrounding inflammatory changes. Spleen: Normal in size without focal abnormality. Adrenals/Urinary Tract: Adrenal glands are unremarkable. Stable appearance of kidneys including stable densely calcified cyst of the upper pole of the left kidney and benign Bosniak 1 cyst of the medial left kidney. The bladder is decompressed by a stable suprapubic catheter. Stomach/Bowel: No bowel obstruction or ileus. Stable appearance of left lower quadrant colostomy. Stable appearance of stomach after repaired gastrocolic fistula at the level of the body of the stomach without evidence of recurrent fistula. No free intraperitoneal air. The appendix is normal. Vascular/Lymphatic: Stable atherosclerosis of the abdominal aorta and iliac arteries without aneurysm. No lymphadenopathy identified. Reproductive: Prostate is unremarkable. Other: Stable right-sided posterior ischial decubitus ulcer that nearly extends to the bony surface of the posterior ischial tuberosity. Chronic sclerosis of the ischial tuberosity.  No fluid collections. Minimal skin thickening overlying the sacrum without significant ulceration. Musculoskeletal: No fractures. Stable fused appearance of the L4 and L5 vertebral bodies. IMPRESSION: 1. No acute findings in  the abdomen or pelvis. 2. Stable appearance of the stomach after repaired gastrocolic fistula at the level of the body of the stomach without evidence of recurrent fistula. 3. Stable right-sided posterior ischial decubitus ulcer that nearly extends to the bony surface of the posterior ischial tuberosity. Chronic sclerosis of the ischial tuberosity. No fluid collections. 4. Minimal skin thickening overlying the sacrum without significant ulceration. 5. Stable appearance of left lower quadrant colostomy. 6. Stable suprapubic catheter. 7. Stable densely calcified cyst of the upper pole of the left kidney and benign Bosniak 1 cyst of the medial left kidney. 8. Aortic atherosclerosis. Aortic Atherosclerosis (ICD10-I70.0). Electronically Signed   By: Irish Lack M.D.   On: 06/30/2023 16:04   DG Foot Complete Right  Result Date: 06/30/2023 CLINICAL DATA:  Plantar foot infection EXAM: RIGHT FOOT COMPLETE - 4 VIEW COMPARISON:  None Available. FINDINGS: Extensive soft tissue swelling about the foot. Tiny plantar and Achilles calcaneal spurs. Severe osteopenia. There is some soft tissue gas seen about the fifth digit. There is underlying bony erosive changes involving the head of fifth metatarsal. With patient's provided history and appearance an area of osteomyelitis is possible. No fracture or dislocation. IMPRESSION: Severe osteopenia and soft tissue swelling with soft tissue gas and thickening about the fifth digit near the fifth metatarsophalangeal joint with underlying erosive changes suggested along the fifth metatarsal head. An area of bone infection is possible. Further workup as clinically appropriate such as MRI or bone scan to further delineate Electronically Signed   By: Karen Kays  M.D.   On: 06/30/2023 15:02    Review of Systems  Constitutional:  Negative for chills.  Cardiovascular:  Positive for leg swelling.  Neurological:  Positive for weakness.  Endo/Heme/Allergies: Negative.   Psychiatric/Behavioral: Negative.    All other systems reviewed and are negative.  Blood pressure (!) 97/45, pulse 62, temperature 97.8 F (36.6 C), temperature source Oral, resp. rate 17, height 6' (1.829 m), weight 98 kg, SpO2 98%. Physical Exam HENT:     Head: Normocephalic and atraumatic.  Eyes:     Extraocular Movements: Extraocular movements intact.  Cardiovascular:     Rate and Rhythm: Normal rate.  Pulmonary:     Effort: Pulmonary effort is normal.     Breath sounds: Stridor present.  Musculoskeletal:        General: Swelling present.     Comments: Right foot plantar aspect over the fifth MTP there is a 2 x 2 cm decubitus ulcer extending deep to the MTP joint.  Minor necrotic tissue.  No purulent drainage.  No surrounding cellulitis.  There is moderate edema in the foot.  He does have a 2+ dorsalis pedis pulse 1+ posterior tibial pulse.  He reports sensation to light touch but has no motor function.  Good capillary refill.  The foot is warm. In addition the patient has a decubitus subcutaneous in the posterior aspect of the calf.     Assessment/Plan:  1.  Diabetic and neuropathic ulcer plantar aspect of the right foot over the right fifth MTP joint extending to the joint and proximal phalanx where there is an osteonecrosis of the fifth metatarsal head and the proximal phalanx with probable septic arthritis of the MTP joint.  Patient has good pulses distally however there is a non-insulin-dependent diabetic. 2.  Paraplegia secondary to transverse myelitis. 3.  Non-insulin-dependent diabetes  Plan:  1.  The patient will most likely require operative debridement and likely amputation of the fifth digit.  I will  consult our foot specialist Dr Odis Hollingshead or Dr. Lajoyce Corners. In  the interim I would recommend wound care consult for local ulcer debridement.  And broad-spectrum antibiotic coverage. His condition is complicated by an extensive sacral decubitus ulcer.    Javier Docker 07/01/2023, 12:21 PM   [phone] cell  Addendum: Dr. Odis Hollingshead agreed to proceed with definitive fifth ray amputation on Monday morning.  N.p.o. after midnight Sunday.  Will post in the OR.

## 2023-07-01 NOTE — Progress Notes (Signed)
Pt wanting to call 911 to have them move him off the "air Bed" and back to the bed he had before. Pt educated about the benefits of "air bed"  He is insisting that he be moved now. Pt moved back to regular hospital bed.

## 2023-07-01 NOTE — Progress Notes (Signed)
   07/01/23 0459  Provider Notification  Provider Name/Title DR. Maryjean Ka  Date Provider Notified 07/01/23  Time Provider Notified [phone]  Method of Notification Page (secure chat)  Notification Reason Critical Result  Test performed and critical result Lactic acid  5.8 (await)  Date Critical Result Received 07/01/23  Time Critical Result Received 0320  Provider response  (await)   Critical lab report

## 2023-07-01 NOTE — Plan of Care (Signed)
Problem: Coping: Goal: Ability to adjust to condition or change in health will improve Outcome: Progressing   Problem: Nutrition: Goal: Adequate nutrition will be maintained Outcome: Progressing   Problem: Elimination: Goal: Will not experience complications related to bowel motility Outcome: Progressing Goal: Will not experience complications related to urinary retention Outcome: Progressing   Problem: Pain Managment: Goal: General experience of comfort will improve Outcome: Progressing

## 2023-07-01 NOTE — Progress Notes (Signed)
Patient transferred to low AIR mattress safely; patient reports being comfortable with no signs or symptoms of acute distress. Wound care products ordered from materials for wound care and dressings to be applied

## 2023-07-01 NOTE — Care Plan (Addendum)
Orthopaedic Surgery Plan of Care Note   -history and imaging reviewed -pt has right lateral foot ulcer with osteomyelitis -will require right fifth ray amputation -surgery preponed to SUNDAY -please keep NPO and hold VTE ppx from [place] tonight   Netta Cedars, MD Orthopaedic Surgery [facility]

## 2023-07-01 NOTE — Hospital Course (Addendum)
77 year old man complex PMH including paraplegia secondary to transverse myelitis, GI fistula, neurogenic bladder status post suprapubic catheter, stage IV decubitus ulcer, colostomy who was sent to the emergency department for evaluation of sacral ulcer and right foot wound, on treatment with ceftriaxone as an outpatient.  He was admitted for suspected right foot osteomyelitis as well as further evaluation of sacral decubitus.  Other initial findings included mild lactic acidosis.  [facility]   Procedures None

## 2023-07-02 ENCOUNTER — Encounter (HOSPITAL_COMMUNITY)
Admission: EM | Disposition: A | Discharge status: Skilled Nursing Facility | Payer: Self-pay | Source: Skilled Nursing Facility | Attending: Family Medicine

## 2023-07-02 ENCOUNTER — Inpatient Hospital Stay (HOSPITAL_COMMUNITY): Payer: Medicare Other | Admitting: Anesthesiology

## 2023-07-02 ENCOUNTER — Other Ambulatory Visit: Payer: Self-pay

## 2023-07-02 ENCOUNTER — Inpatient Hospital Stay (HOSPITAL_COMMUNITY): Payer: Medicare Other

## 2023-07-02 DIAGNOSIS — E11621 Type 2 diabetes mellitus with foot ulcer: Secondary | ICD-10-CM | POA: Diagnosis not present

## 2023-07-02 DIAGNOSIS — M869 Osteomyelitis, unspecified: Secondary | ICD-10-CM

## 2023-07-02 DIAGNOSIS — L97514 Non-pressure chronic ulcer of other part of right foot with necrosis of bone: Secondary | ICD-10-CM

## 2023-07-02 DIAGNOSIS — Z9359 Other cystostomy status: Secondary | ICD-10-CM | POA: Diagnosis not present

## 2023-07-02 DIAGNOSIS — E1169 Type 2 diabetes mellitus with other specified complication: Secondary | ICD-10-CM

## 2023-07-02 DIAGNOSIS — M86171 Other acute osteomyelitis, right ankle and foot: Secondary | ICD-10-CM | POA: Diagnosis not present

## 2023-07-02 DIAGNOSIS — G8220 Paraplegia, unspecified: Secondary | ICD-10-CM | POA: Diagnosis not present

## 2023-07-02 HISTORY — PX: AMPUTATION TOE: SHX6595

## 2023-07-02 LAB — CBC
HCT: 28 % — ABNORMAL LOW (ref 39.0–52.0)
Hemoglobin: 8.7 g/dL — ABNORMAL LOW (ref 13.0–17.0)
MCH: 26.3 pg (ref 26.0–34.0)
MCHC: 31.1 g/dL (ref 30.0–36.0)
MCV: 84.6 fL (ref 80.0–100.0)
Platelets: 249 10*3/uL (ref 150–400)
RBC: 3.31 MIL/uL — ABNORMAL LOW (ref 4.22–5.81)
RDW: 15.9 % — ABNORMAL HIGH (ref 11.5–15.5)
WBC: 8.1 10*3/uL (ref 4.0–10.5)
nRBC: 0 % (ref 0.0–0.2)

## 2023-07-02 LAB — GLUCOSE, CAPILLARY
Glucose-Capillary: 129 mg/dL — ABNORMAL HIGH (ref 70–99)
Glucose-Capillary: 115 mg/dL — ABNORMAL HIGH (ref 70–99)
Glucose-Capillary: 148 mg/dL — ABNORMAL HIGH (ref 70–99)
Glucose-Capillary: 151 mg/dL — ABNORMAL HIGH (ref 70–99)
Glucose-Capillary: 217 mg/dL — ABNORMAL HIGH (ref 70–99)

## 2023-07-02 LAB — BASIC METABOLIC PANEL
Anion gap: 8 (ref 5–15)
BUN: 14 mg/dL (ref 8–23)
CO2: 22 mmol/L (ref 22–32)
Calcium: 7.9 mg/dL — ABNORMAL LOW (ref 8.9–10.3)
Chloride: 106 mmol/L (ref 98–111)
Creatinine, Ser: 0.61 mg/dL (ref 0.61–1.24)
GFR, Estimated: >60 mL/min (ref >60)
Glucose, Bld: 132 mg/dL — ABNORMAL HIGH (ref 70–99)
Potassium: 3.7 mmol/L (ref 3.5–5.1)
Sodium: 136 mmol/L (ref 135–145)

## 2023-07-02 SURGERY — AMPUTATION, TOE
Anesthesia: Regional | Site: Toe | Laterality: Right

## 2023-07-02 MED ORDER — CEFAZOLIN SODIUM-DEXTROSE 2-4 GM/100ML-% IV SOLN
2.0000 g | INTRAVENOUS | Status: AC
  Administered 2023-07-02: 2 g via INTRAVENOUS

## 2023-07-02 MED ORDER — ONDANSETRON HCL 4 MG/2ML IJ SOLN: 4.0000 mg | Freq: Once | INTRAMUSCULAR | Status: DC | PRN

## 2023-07-02 MED ORDER — BUPIVACAINE HCL (PF) 0.5 % IJ SOLN
INTRAMUSCULAR | Status: DC | PRN
Start: 2023-07-02 — End: 2023-07-02
  Administered 2023-07-02: 25 mL via PERINEURAL

## 2023-07-02 MED ORDER — PROPOFOL 500 MG/50ML IV EMUL
INTRAVENOUS | Status: DC | PRN
  Administered 2023-07-02: 20 mg via INTRAVENOUS
  Administered 2023-07-02: 50 ug/kg/min via INTRAVENOUS
  Administered 2023-07-02: 20 mg via INTRAVENOUS

## 2023-07-02 MED ORDER — FENTANYL CITRATE PF 50 MCG/ML IJ SOSY
PREFILLED_SYRINGE | INTRAMUSCULAR | Status: AC
  Administered 2023-07-02: 50 ug via INTRAVENOUS
  Filled 2023-07-02: qty 2

## 2023-07-02 MED ORDER — CHLORHEXIDINE GLUCONATE 4 % EX SOLN: 60.0000 mL | Freq: Once | CUTANEOUS | Status: DC

## 2023-07-02 MED ORDER — FENTANYL CITRATE PF 50 MCG/ML IJ SOSY: 50.0000 ug | PREFILLED_SYRINGE | Freq: Once | INTRAMUSCULAR | Status: AC

## 2023-07-02 MED ORDER — VANCOMYCIN HCL IN DEXTROSE 1-5 GM/200ML-% IV SOLN
INTRAVENOUS | Status: AC
  Filled 2023-07-02: qty 200

## 2023-07-02 MED ORDER — FENTANYL CITRATE PF 50 MCG/ML IJ SOSY: 25.0000 ug | PREFILLED_SYRINGE | INTRAMUSCULAR | Status: DC | PRN

## 2023-07-02 MED ORDER — LIDOCAINE HCL (CARDIAC) PF 100 MG/5ML IV SOSY
PREFILLED_SYRINGE | INTRAVENOUS | Status: DC | PRN
Start: 2023-07-02 — End: 2023-07-02
  Administered 2023-07-02: 50 mg via INTRATRACHEAL

## 2023-07-02 MED ORDER — VANCOMYCIN HCL 1000 MG IV SOLR
INTRAVENOUS | Status: AC
  Filled 2023-07-02: qty 20

## 2023-07-02 MED ORDER — IRRISEPT - 450ML BOTTLE WITH 0.05% CHG IN STERILE WATER, USP 99.95% OPTIME
TOPICAL | Status: DC | PRN
Start: 2023-07-02 — End: 2023-07-02
  Administered 2023-07-02: 450 mL

## 2023-07-02 MED ORDER — POVIDONE-IODINE 10 % EX SOLN
CUTANEOUS | Status: DC | PRN
Start: 2023-07-02 — End: 2023-07-02
  Administered 2023-07-02: 1 via TOPICAL

## 2023-07-02 MED ORDER — VANCOMYCIN HCL IN DEXTROSE 1-5 GM/200ML-% IV SOLN
1000.0000 mg | INTRAVENOUS | Status: AC
  Administered 2023-07-02: 1000 mg via INTRAVENOUS

## 2023-07-02 MED ORDER — ACETAMINOPHEN 10 MG/ML IV SOLN: 1000.0000 mg | Freq: Once | INTRAVENOUS | Status: DC | PRN

## 2023-07-02 MED ORDER — SODIUM CHLORIDE 0.9 % IR SOLN
Status: DC | PRN
  Administered 2023-07-02: 3000 mL

## 2023-07-02 MED ORDER — ONDANSETRON HCL 4 MG/2ML IJ SOLN
INTRAMUSCULAR | Status: DC | PRN
  Administered 2023-07-02: 4 mg via INTRAVENOUS

## 2023-07-02 MED ORDER — CEFAZOLIN SODIUM-DEXTROSE 2-4 GM/100ML-% IV SOLN
INTRAVENOUS | Status: AC
  Filled 2023-07-02: qty 100

## 2023-07-02 MED ORDER — LACTATED RINGERS IV SOLN: INTRAVENOUS | Status: DC | PRN

## 2023-07-02 MED ORDER — VANCOMYCIN HCL 1000 MG IV SOLR
INTRAVENOUS | Status: DC | PRN
  Administered 2023-07-02: 500 mg via TOPICAL

## 2023-07-02 SURGICAL SUPPLY — 61 items
APL PRP STRL LF DISP 70% ISPRP (MISCELLANEOUS) ×2
BAG COUNTER SPONGE SURGICOUNT (BAG) IMPLANT
BAG SPEC THK2 15X12 ZIP CLS (MISCELLANEOUS) ×1
BAG SPNG CNTER NS LX DISP (BAG)
BAG ZIPLOCK 12X15 (MISCELLANEOUS) ×1 IMPLANT
BLADE OSCILLATING/SAGITTAL (BLADE) ×1
BLADE SURG 15 STRL LF DISP TIS (BLADE) ×3 IMPLANT
BLADE SURG 15 STRL SS (BLADE) ×3
BLADE SW THK.38XMED LNG THN (BLADE) IMPLANT
BNDG CMPR 5X4 CHSV STRCH STRL (GAUZE/BANDAGES/DRESSINGS) ×1
BNDG CMPR 5X4 KNIT ELC UNQ LF (GAUZE/BANDAGES/DRESSINGS) ×1
BNDG CMPR 6 X 5 YARDS HK CLSR (GAUZE/BANDAGES/DRESSINGS) ×1
BNDG CMPR 9X4 STRL LF SNTH (GAUZE/BANDAGES/DRESSINGS) ×1
BNDG COHESIVE 4X5 TAN STRL LF (GAUZE/BANDAGES/DRESSINGS) ×1 IMPLANT
BNDG ELASTIC 4INX 5YD STR LF (GAUZE/BANDAGES/DRESSINGS) IMPLANT
BNDG ELASTIC 6INX 5YD STR LF (GAUZE/BANDAGES/DRESSINGS) IMPLANT
BNDG ESMARK 4X9 LF (GAUZE/BANDAGES/DRESSINGS) ×1 IMPLANT
BNDG GAUZE DERMACEA FLUFF 4 (GAUZE/BANDAGES/DRESSINGS) ×1 IMPLANT
BNDG GZE 12X3 1 PLY HI ABS (GAUZE/BANDAGES/DRESSINGS) ×1
BNDG GZE DERMACEA 4 6PLY (GAUZE/BANDAGES/DRESSINGS) ×1
BNDG STRETCH GAUZE 3IN X12FT (GAUZE/BANDAGES/DRESSINGS) ×1 IMPLANT
CHLORAPREP W/TINT 26 (MISCELLANEOUS) ×2 IMPLANT
CNTNR URN SCR LID CUP LEK RST (MISCELLANEOUS) IMPLANT
CONT SPEC 4OZ STRL OR WHT (MISCELLANEOUS)
COVER SURGICAL LIGHT HANDLE (MISCELLANEOUS) ×1 IMPLANT
CUFF TOURN SGL QUICK 34 (TOURNIQUET CUFF)
CUFF TRNQT CYL 34X4.125X (TOURNIQUET CUFF) IMPLANT
DRAPE C-ARM 42X120 X-RAY (DRAPES) IMPLANT
DRAPE EXTREMITY T 121X128X90 (DISPOSABLE) ×1 IMPLANT
DRAPE OEC MINIVIEW 54X84 (DRAPES) ×1 IMPLANT
DRAPE SHEET LG 3/4 BI-LAMINATE (DRAPES) ×1 IMPLANT
DRAPE SURG 17X11 SM STRL (DRAPES) ×2 IMPLANT
DRAPE U-SHAPE 47X51 STRL (DRAPES) ×2 IMPLANT
DRSG ADAPTIC 3X8 NADH LF (GAUZE/BANDAGES/DRESSINGS) ×1 IMPLANT
DRSG EMULSION OIL 3X3 NADH (GAUZE/BANDAGES/DRESSINGS) ×1 IMPLANT
ELECT PENCIL ROCKER SW 15FT (MISCELLANEOUS) ×1 IMPLANT
ELECT REM PT RETURN 15FT ADLT (MISCELLANEOUS) ×1 IMPLANT
GAUZE PAD ABD 8X10 STRL (GAUZE/BANDAGES/DRESSINGS) ×1 IMPLANT
GAUZE SPONGE 4X4 12PLY STRL (GAUZE/BANDAGES/DRESSINGS) ×1 IMPLANT
GAUZE XEROFORM 1X8 LF (GAUZE/BANDAGES/DRESSINGS) IMPLANT
GLOVE BIO SURGEON STRL SZ8 (GLOVE) ×1 IMPLANT
GLOVE BIOGEL PI IND STRL 7.5 (GLOVE) ×2 IMPLANT
GLOVE BIOGEL PI IND STRL 8 (GLOVE) ×1 IMPLANT
GLOVE SS BIOGEL STRL SZ 7.5 (GLOVE) ×1 IMPLANT
GOWN STRL REUS W/ TWL LRG LVL3 (GOWN DISPOSABLE) ×1 IMPLANT
GOWN STRL REUS W/TWL LRG LVL3 (GOWN DISPOSABLE) ×1
KIT BASIN OR (CUSTOM PROCEDURE TRAY) ×1 IMPLANT
KIT TURNOVER KIT A (KITS) IMPLANT
MARKER SKIN DUAL TIP RULER LAB (MISCELLANEOUS) ×1 IMPLANT
NS IRRIG 1000ML POUR BTL (IV SOLUTION) ×1 IMPLANT
PACK GENERAL/GYN (CUSTOM PROCEDURE TRAY) ×1 IMPLANT
PAD CAST 4YDX4 CTTN HI CHSV (CAST SUPPLIES) ×1 IMPLANT
PADDING CAST COTTON 4X4 STRL (CAST SUPPLIES) ×1
PROTECTOR NERVE ULNAR (MISCELLANEOUS) ×2 IMPLANT
SET IRRIG Y TYPE TUR BLADDER L (SET/KITS/TRAYS/PACK) ×1 IMPLANT
STOCKINETTE 8 INCH (MISCELLANEOUS) ×1 IMPLANT
SUT ETHILON 3 0 PS 1 (SUTURE) ×1 IMPLANT
SUT PDS AB 2-0 CT2 27 (SUTURE) IMPLANT
SUT PDS AB 3-0 PS2 18 (SUTURE) IMPLANT
SUT PDS AB 3-0 SH 27 (SUTURE) IMPLANT
WATER STERILE IRR 1000ML POUR (IV SOLUTION) ×1 IMPLANT

## 2023-07-02 NOTE — Progress Notes (Signed)
Progress Note   Patient: Brent [name]. MWN:[id number] DOB: Sep 26, 1945 DOA: 06/30/2023     1 DOS: the patient was seen and examined on 07/02/2023   Brief hospital course: 77 year old man complex PMH including paraplegia secondary to transverse myelitis, GI fistula, neurogenic bladder status post suprapubic catheter, stage IV decubitus ulcer, colostomy who was sent to the emergency department for evaluation of sacral ulcer and right foot wound, on treatment with ceftriaxone as an outpatient.  He was admitted for suspected right foot osteomyelitis as well as further evaluation of sacral decubitus.  Other initial findings included mild lactic acidosis.  [facility]   Procedures None  Assessment and Plan: Diabetic right foot ulcer Acute osteomyelitis right fifth metatarsal Septic arthritis fifth MTP joint MRI fifth MTP joint septic arthritis and osteomyelitis of the fifth metatarsal and fifth proximal phalanx.   Orthopedic consultation appreciated.  Plan for surgery today. Continue empiric antibiotics.   *Stage IV decubitus ulcer Ischial decubitus ulcer Chronic in nature.  No evidence on admission for infection.  CT imaging showed stable right sided ischial decubitus ulcer, no fluid collections. Wound care consultation appreciated   Lactic acidosis on admission   Paraplegia secondary to transverse myelitis Neurogenic bladder status post suprapubic catheter   Pyuria Suprapubic catheter Monitor clinically   Repaired gastrocolic fistula Colostomy   DM type 2 CBG remains stable.  Continue Semglee, sliding scale insulin.  Hold metformin while hospitalized   CAD Continue atenolol, atorvastatin   Aortic atherosclerosis Continue statin.      Subjective:  Feels better today Has some soreness on back/bottom.  Physical Exam: Vitals:   07/01/23 0526 07/01/23 1353 07/01/23 2225 07/02/23 0559  BP: (!) 97/45 (!) 113/52 (!) 98/43 (!) 112/58  Pulse: 62 63 63  (!) 56  Resp: 17 17 17 18   Temp: 97.8 F (36.6 C) 98.3 F (36.8 C) 97.8 F (36.6 C) 98.7 F (37.1 C)  TempSrc: Oral Oral    SpO2: 98% 96% 95% 99%  Weight:      Height:       Physical Exam Vitals reviewed.  Constitutional:      General: He is not in acute distress.    Appearance: He is not ill-appearing or toxic-appearing.  Cardiovascular:     Rate and Rhythm: Normal rate and regular rhythm.     Heart sounds: No murmur heard. Pulmonary:     Effort: Pulmonary effort is normal. No respiratory distress.     Breath sounds: No wheezing, rhonchi or rales.  Neurological:     Mental Status: He is alert.  Psychiatric:        Mood and Affect: Mood normal.        Behavior: Behavior normal.     Data Reviewed: CBG stable BMP noted Hgb 8.7, stable  Family Communication: none present  Disposition: Status is: Inpatient Remains inpatient appropriate because: acute osteomyelitis     Time spent: 20 minutes  Author: Brendia Sacks, MD 07/02/2023 9:17 AM  For on call review [email or web address].

## 2023-07-02 NOTE — Anesthesia Procedure Notes (Signed)
Anesthesia Regional Block: Popliteal block   Pre-Anesthetic Checklist: , timeout performed,  Correct Patient, Correct Site, Correct Laterality,  Correct Procedure, Correct Position, site marked,  Risks and benefits discussed,  Pre-op evaluation,  At surgeon's request and post-op pain management  Laterality: Lower and Right  Prep: Maximum Sterile Barrier Precautions used, chloraprep       Needles:  Injection technique: Single-shot  Needle Type: Echogenic Needle     Needle Length: 9cm  Needle Gauge: 21     Additional Needles:   Procedures:,,,, ultrasound used (permanent image in chart),,    Narrative:  Start time: 07/02/2023 1:16 PM End time: 07/02/2023 1:23 PM Injection made incrementally with aspirations every 5 mL.  Performed by: Personally  Anesthesiologist: Trevor Iha, MD  Additional Notes: Block assessed. Patient tolerated procedure well.

## 2023-07-02 NOTE — Transfer of Care (Signed)
Immediate Anesthesia Transfer of Care Note  Patient: Antwone Capozzoli.  Procedure(s) Performed: AMPUTATION TOE FIFTH RAY (Right: Toe)  Patient Location: PACU  Anesthesia Type:MAC combined with regional for post-op pain  Level of Consciousness: awake and alert   Airway & Oxygen Therapy: Patient Spontanous Breathing and Patient connected to nasal cannula oxygen  Post-op Assessment: Report given to RN and Post -op Vital signs reviewed and stable  Post vital signs: Reviewed and stable  Last Vitals:  Vitals Value Taken Time  BP    Temp    Pulse 59 07/02/23 1447  Resp 19 07/02/23 1447  SpO2 99 % 07/02/23 1447  Vitals shown include unfiled device data.  Last Pain:  Vitals:   07/02/23 1325  TempSrc:   PainSc: 0-No pain         Complications: No notable events documented.

## 2023-07-02 NOTE — Plan of Care (Signed)
  Problem: Nutrition: Goal: Adequate nutrition will be maintained Outcome: Progressing   Problem: Pain Managment: Goal: General experience of comfort will improve Outcome: Progressing   

## 2023-07-02 NOTE — Anesthesia Postprocedure Evaluation (Signed)
Anesthesia Post Note  Patient: Brent [name].  Procedure(s) Performed: AMPUTATION TOE FIFTH RAY (Right: Toe)     Patient location during evaluation: PACU Anesthesia Type: Regional Level of consciousness: awake and alert Pain management: pain level controlled Vital Signs Assessment: post-procedure vital signs reviewed and stable Respiratory status: spontaneous breathing, nonlabored ventilation, respiratory function stable and patient connected to nasal cannula oxygen Cardiovascular status: stable and blood pressure returned to baseline Postop Assessment: no apparent nausea or vomiting Anesthetic complications: no   No notable events documented.  Last Vitals:  Vitals:   07/02/23 1610 07/02/23 1820  BP: (!) 144/53 (!) 134/49  Pulse: (!) 57 (!) 59  Resp: 16 17  Temp: 36.6 C 36.9 C  SpO2: 98% 96%    Last Pain:  Vitals:   07/02/23 1820  TempSrc: Oral  PainSc:                  Trevor Iha

## 2023-07-02 NOTE — H&P (Signed)
H&P Update:  -History and Physical Reviewed  -Patient has been examined  -No change in the plan of care  -The risks and benefits were presented and reviewed. The risks due to recurrent/new/persistent infection, further amputation, failure of source control, stiffness, nerve/vessel/tendon injury or rerupture of repaired tendon, nonunion/malunion, allograft usage, wound healing issues, development of arthritis, failure of this surgery, possibility of delayed definitive surgery, need for further surgery, thromboembolic events, anesthesia/medical complications, amputation, death among others were discussed. The patient's wife acknowledged the explanation, agreed to proceed with the plan and a consent was signed.  Brent [name]

## 2023-07-02 NOTE — Plan of Care (Signed)
  Problem: Nutrition: Goal: Adequate nutrition will be maintained Outcome: Progressing   

## 2023-07-02 NOTE — NC FL2 (Signed)
MEDICAID FL2 LEVEL OF CARE FORM     IDENTIFICATION  Patient Name: Brent [name]. Birthdate: 1946-07-11 Sex: male Admission Date (Current Location): 06/30/2023  [place] Number:  Producer, television/film/video and Address:  [facility],  [address], [place] [address]      Provider Number: [id number]  Attending Physician Name and Address:  Standley Brooking, MD  Relative Name and Phone Number:  Kenric, Lindquist [phone]    Current Level of Care: Hospital Recommended Level of Care: Skilled Nursing Facility Prior Approval Number:    Date Approved/Denied:   PASRR Number: [id number]  Discharge Plan: SNF    Current Diagnoses: Patient Active Problem List   Diagnosis Date Noted   Diabetic foot ulcer (HCC) 07/01/2023   Acute osteomyelitis of metatarsal bone of right foot (HCC) 07/01/2023   Septic arthritis of foot (HCC) 07/01/2023   Colostomy present (HCC) 07/01/2023   DM type 2 (diabetes mellitus, type 2) (HCC) 07/01/2023   Aortic atherosclerosis (HCC) 07/01/2023   Pressure ulcer 07/01/2023   Stage IV decubitus ulcer (HCC) 06/30/2023   Urine leukocytes 06/30/2023   SIRS (systemic inflammatory response syndrome) (HCC) 06/05/2023   Suprapubic catheter (HCC) 06/05/2023   Dehydration 02/21/2023   Sepsis (HCC) 02/21/2023   Acute metabolic encephalopathy 02/21/2023   Lactic acidosis 02/21/2023   Insulin dependent type 2 diabetes mellitus (HCC) 02/21/2023   Normocytic anemia 02/21/2023   Primary malignant neuroendocrine tumor of stomach (HCC) 05/27/2022   Preoperative examination 05/27/2022   Abnormal endoscopy of upper gastrointestinal tract 05/27/2022   Benign neuroendocrine tumor of stomach  04/04/2022   Mucosal abnormality of stomach    Chronic osteomyelitis of pelvic region, right (HCC) 03/25/2022   External gastrointestinal fistula site infection 03/25/2022   Complicated urinary tract infection    Catheter-associated urinary  tract infection (HCC) 08/06/2021   Osteomyelitis of pelvic region, acute, right (HCC) 03/24/2021   Right ischial pressure sore 02/10/2021   Stage IV pressure ulcer of sacral region (HCC) 12/22/2020   Hypokalemia    Encounter for monitoring immunomodulating therapy 08/20/2020   High risk medication use 08/20/2020   Malnutrition of moderate degree 06/04/2020   Palliative care by specialist    Goals of care, counseling/discussion    FTT (failure to thrive) in adult 06/01/2020   Abdominal pain    Sacral ulcer, with necrosis of muscle (HCC) 04/09/2020   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HCC)    Transverse myelitis (HCC)    Labile blood pressure    Labile blood glucose    Neurogenic bladder    Neurogenic bowel    Transaminitis    Controlled type 2 diabetes mellitus with hyperglycemia, with long-term current use of insulin (HCC)    Quadriplegia (HCC)    Dyslipidemia    Paraplegia (HCC)    Coronary artery disease involving native coronary artery of native heart without angina pectoris    Steroid-induced hyperglycemia    Diabetic peripheral neuropathy (HCC)    Weakness 03/09/2020   Chest pain 03/08/2020   Right leg numbness    Malignant melanoma of left side of neck (HCC) 10/25/2018   Facial neuritis 10/25/2018   Myofascial pain syndrome 10/25/2018   Occipital neuralgia of left side 10/25/2018   Male hypogonadism 06/20/2016   Renal lesion 06/20/2016   Insomnia 08/07/2015   Enlarged prostate with lower urinary tract symptoms (LUTS) 11/07/2014   Balanitis 07/30/2012   Bladder neck obstruction 07/30/2012   Prostate nodule 06/18/2012   Recurrent nephrolithiasis 06/18/2012  Benign neoplasm of colon 08/29/2011   DEPRESSION, SITUATIONAL, ACUTE 01/23/2010   Essential hypertension 05/30/2009   Coronary atherosclerosis 05/30/2009   Obstructive sleep apnea 11/28/2008   OBESITY 09/23/2008   ERECTILE DYSFUNCTION 11/26/2007   Well controlled type 2 diabetes mellitus with peripheral circulatory  disorder (HCC) 05/09/2007   Hyperlipidemia 05/09/2007   MYOCARDIAL INFARCTION, HX OF 05/09/2007   DIVERTICULOSIS, COLON 05/09/2007    Orientation RESPIRATION BLADDER Height & Weight     Self, Time, Situation, Place  Normal Indwelling catheter, Continent Weight: 216 lb (98 kg) Height:  6' (182.9 cm)  BEHAVIORAL SYMPTOMS/MOOD NEUROLOGICAL BOWEL NUTRITION STATUS      Colostomy, Continent Diet (See discharge summary)  AMBULATORY STATUS COMMUNICATION OF NEEDS Skin   Total Care Verbally PU Stage and Appropriate Care     PU Stage 3 Dressing: BID (Buttocks Bilateral Deep Tissue Pressure Injury; Ischial tuberosity Posterior;Proximal;Right Stage 3)                 Personal Care Assistance Level of Assistance  Bathing, Feeding, Dressing Bathing Assistance: Maximum assistance Feeding assistance: Limited assistance Dressing Assistance: Maximum assistance     Functional Limitations Info  Sight, Speech, Hearing Sight Info: Adequate Hearing Info: Adequate Speech Info: Adequate    SPECIAL CARE FACTORS FREQUENCY  PT (By licensed PT), OT (By licensed OT)     PT Frequency: 5x/wk OT Frequency: 5x/wk            Contractures Contractures Info: Not present    Additional Factors Info  Code Status, Allergies Code Status Info: FULL Allergies Info: Cipro (Ciprofloxacin Hcl), Neurontin (Gabapentin), Levaquin (Levofloxacin), Penicillins           Current Medications (07/02/2023):  This is the current hospital active medication list Current Facility-Administered Medications  Medication Dose Route Frequency Provider Last Rate Last Admin   [MAR Hold] acetaminophen (TYLENOL) tablet 650 mg  650 mg Oral Q6H PRN Nolberto Hanlon, MD       Or   Mitzi Hansen Hold] acetaminophen (TYLENOL) suppository 650 mg  650 mg Rectal Q6H PRN Nolberto Hanlon, MD       [facility] Hold] ascorbic acid (VITAMIN C) tablet 500 mg  500 mg Oral Daily Nolberto Hanlon, MD   500 mg at 07/01/23 0909   [MAR Hold] atenolol (TENORMIN) tablet 12.5  mg  12.5 mg Oral Daily Nolberto Hanlon, MD   12.5 mg at 07/01/23 0908   And   [MAR Hold] atenolol (TENORMIN) tablet 25 mg  25 mg Oral Sherene Sires, MD   25 mg at 07/01/23 2150   [MAR Hold] atorvastatin (LIPITOR) tablet 40 mg  40 mg Oral Sherene Sires, MD   40 mg at 07/01/23 2150   [MAR Hold] baclofen (LIORESAL) tablet 20 mg  20 mg Oral TID Nolberto Hanlon, MD   20 mg at 07/01/23 2150   ceFAZolin (ANCEF) 2-4 GM/100ML-% IVPB            [START ON 07/03/2023] ceFAZolin (ANCEF) IVPB 2g/100 mL premix  2 g Intravenous On Call to OR Netta Cedars, MD       [facility] Hold] cefTRIAXone (ROCEPHIN) 2 g in sodium chloride 0.9 % 100 mL IVPB  2 g Intravenous Q24H Standley Brooking, MD 200 mL/hr at 07/02/23 0712 2 g at 07/02/23 0712   And   [MAR Hold] metroNIDAZOLE (FLAGYL) tablet 500 mg  500 mg Oral Q12H Standley Brooking, MD   500 mg at 07/01/23 2149   chlorhexidine (HIBICLENS) 4 % liquid 4 Application  60 mL Topical Once Netta Cedars, MD       [facility] Hold] cholecalciferol (VITAMIN D3) 25 MCG (1000 UNIT) tablet 2,000 Units  2,000 Units Oral Daily Nolberto Hanlon, MD   2,000 Units at 07/01/23 0908   [MAR Hold] DULoxetine (CYMBALTA) DR capsule 30 mg  30 mg Oral QHS Nolberto Hanlon, MD   30 mg at 07/01/23 2150   [MAR Hold] enoxaparin (LOVENOX) injection 40 mg  40 mg Subcutaneous Q24H Nolberto Hanlon, MD   40 mg at 07/01/23 0907   [MAR Hold] famotidine (PEPCID) tablet 20 mg  20 mg Oral BID Nolberto Hanlon, MD   20 mg at 07/01/23 2149   [facility] Hold] Gerhardt's butt cream   Topical TID Nolberto Hanlon, MD   Given at 07/01/23 1657   [MAR Hold] insulin aspart (novoLOG) injection 0-15 Units  0-15 Units Subcutaneous TID WC Nolberto Hanlon, MD   3 Units at 07/01/23 1832   [MAR Hold] insulin aspart (novoLOG) injection 0-5 Units  0-5 Units Subcutaneous QHS Nolberto Hanlon, MD       [facility] Hold] insulin glargine-yfgn [facility]) injection 35 Units  35 Units Subcutaneous BID Nolberto Hanlon, MD   35 Units at 07/01/23 2149   [MAR Hold] lamoTRIgine (LAMICTAL)  tablet 50 mg  50 mg Oral BID Nolberto Hanlon, MD   50 mg at 07/01/23 2149   [MAR Hold] LORazepam (ATIVAN) tablet 0.5 mg  0.5 mg Oral Daily PRN Nolberto Hanlon, MD   0.5 mg at 07/01/23 2143   [MAR Hold] ondansetron (ZOFRAN-ODT) disintegrating tablet 4 mg  4 mg Oral Q8H PRN Nolberto Hanlon, MD       [facility] Hold] oxyCODONE (Oxy IR/ROXICODONE) immediate release tablet 5 mg  5 mg Oral BID PRN Nolberto Hanlon, MD   5 mg at 07/01/23 2143   [MAR Hold] pantoprazole (PROTONIX) EC tablet 40 mg  40 mg Oral BID Nolberto Hanlon, MD   40 mg at 07/01/23 2149   [MAR Hold] polyethylene glycol (MIRALAX / GLYCOLAX) packet 17 g  17 g Oral Daily PRN Nolberto Hanlon, MD       [facility] Hold] polyvinyl alcohol (LIQUIFILM TEARS) 1.4 % ophthalmic solution 1 drop  1 drop Both Eyes QID PRN Nolberto Hanlon, MD       [facility]itz[facility] Hansen Hold] senna-docusate (Senokot-S) tablet 1 tablet  1 tablet Oral BID Nolberto Hanlon, MD   1 tablet at 07/01/23 2148   [facility] Hold] sodium chloride flush (NS) 0.9 % injection 3 mL  3 mL Intravenous Q12H Nolberto Hanlon, MD   3 mL at 07/02/23 1133   [MAR Hold] traZODone (DESYREL) tablet 75 mg  75 mg Oral Sherene Sires, MD   75 mg at 07/01/23 2143   vancomycin (VANCOCIN) 1-5 GM/200ML-% IVPB            [START ON 07/03/2023] vancomycin (VANCOCIN) IVPB 1000 mg/200 mL premix  1,000 mg Intravenous On Call to OR Netta Cedars, MD         Discharge Medications: Please see discharge summary for a list of discharge medications.  Relevant Imaging Results:  Relevant Lab Results:   Additional Information SSN:[id number]  Otelia Santee, LCSW

## 2023-07-02 NOTE — TOC Initial Note (Signed)
Transition of Care (TOC) - Initial/Assessment Note    Patient Details  Name: Brent [name]. MRN: [id number] Date of Birth: 19-Jun-1946  Transition of Care [facility]) CM/SW Contact:    Otelia Santee, LCSW Phone Number: 07/02/2023, 12:33 PM  Clinical Narrative:                 Pt from [facility] where he is a LTC resident. Pt has quadriparesis with paraplegia and is bed/wheelchair bound. Confirmed plan with pt for return to [facility] at discharge. Pt will require PTAR for transportation at discharge.   Expected Discharge Plan: Long Term Nursing Home Barriers to Discharge: Continued Medical Work up   Patient Goals and CMS Choice Patient states their goals for this hospitalization and ongoing recovery are:: To return to [facility].gov Compare Post Acute Care list provided to::  (NA) Choice offered to / list presented to :  (NA) [place] ownership interest in [facility].provided to::  (NA)    Expected Discharge Plan and Services In-house Referral: Clinical Social Work Discharge Planning Services: NA Post Acute Care Choice: Resumption of Svcs/PTA Provider, Nursing Home Living arrangements for the past 2 months: Skilled Nursing Facility                 DME Arranged: N/A DME Agency: NA                  Prior Living Arrangements/Services Living arrangements for the past 2 months: Skilled Nursing Facility Lives with:: Facility Resident Patient language and need for interpreter reviewed:: Yes Do you feel safe going back to the place where you live?: Yes      Need for Family Participation in Patient Care: No (Comment) Care giver support system in place?: Yes (comment) Current home services: DME (w/c) Criminal Activity/Legal Involvement Pertinent to Current Situation/Hospitalization: No - Comment as needed  Activities of Daily Living   ADL Screening (condition at time of admission) Independently performs ADLs?: No Does the patient  have a NEW difficulty with bathing/dressing/toileting/self-feeding that is expected to last >3 days?: No Does the patient have a NEW difficulty with getting in/out of bed, walking, or climbing stairs that is expected to last >3 days?: No Does the patient have a NEW difficulty with communication that is expected to last >3 days?: No Is the patient deaf or have difficulty hearing?: No Does the patient have difficulty seeing, even when wearing glasses/contacts?: No Does the patient have difficulty concentrating, remembering, or making decisions?: No  Permission Sought/Granted Permission sought to share information with : Facility Industrial/product designer granted to share information with : Yes, Verbal Permission Granted     Permission granted to share info w AGENCY: [facility]        Emotional Assessment   Attitude/Demeanor/Rapport: Engaged Affect (typically observed): Accepting Orientation: : Oriented to Self, Oriented to Place, Oriented to  Time, Oriented to Situation Alcohol / Substance Use: Not Applicable Psych Involvement: No (comment)  Admission diagnosis:  Wound infection [T14.8XXA, L08.9] Pressure ulcer [L89.90] Skin ulcer of sacrum with necrosis of muscle (HCC) [L98.423] Osteomyelitis of right foot, unspecified type [facility]) [M86.9] Patient Active Problem List   Diagnosis Date Noted   Diabetic foot ulcer (HCC) 07/01/2023   Acute osteomyelitis of metatarsal bone of right foot (HCC) 07/01/2023   Septic arthritis of foot (HCC) 07/01/2023   Colostomy present (HCC) 07/01/2023   DM type 2 (diabetes mellitus, type 2) (HCC) 07/01/2023   Aortic atherosclerosis (HCC) 07/01/2023  Pressure ulcer 07/01/2023   Stage IV decubitus ulcer (HCC) 06/30/2023   Urine leukocytes 06/30/2023   SIRS (systemic inflammatory response syndrome) (HCC) 06/05/2023   Suprapubic catheter (HCC) 06/05/2023   Dehydration 02/21/2023   Sepsis (HCC) 02/21/2023   Acute metabolic encephalopathy  02/21/2023   Lactic acidosis 02/21/2023   Insulin dependent type 2 diabetes mellitus (HCC) 02/21/2023   Normocytic anemia 02/21/2023   Primary malignant neuroendocrine tumor of stomach (HCC) 05/27/2022   Preoperative examination 05/27/2022   Abnormal endoscopy of upper gastrointestinal tract 05/27/2022   Benign neuroendocrine tumor of stomach  04/04/2022   Mucosal abnormality of stomach    Chronic osteomyelitis of pelvic region, right (HCC) 03/25/2022   External gastrointestinal fistula site infection 03/25/2022   Complicated urinary tract infection    Catheter-associated urinary tract infection (HCC) 08/06/2021   Osteomyelitis of pelvic region, acute, right (HCC) 03/24/2021   Right ischial pressure sore 02/10/2021   Stage IV pressure ulcer of sacral region (HCC) 12/22/2020   Hypokalemia    Encounter for monitoring immunomodulating therapy 08/20/2020   High risk medication use 08/20/2020   Malnutrition of moderate degree 06/04/2020   Palliative care by specialist    Goals of care, counseling/discussion    FTT (failure to thrive) in adult 06/01/2020   Abdominal pain    Sacral ulcer, with necrosis of muscle (HCC) 04/09/2020   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HCC)    Transverse myelitis (HCC)    Labile blood pressure    Labile blood glucose    Neurogenic bladder    Neurogenic bowel    Transaminitis    Controlled type 2 diabetes mellitus with hyperglycemia, with long-term current use of insulin (HCC)    Quadriplegia (HCC)    Dyslipidemia    Paraplegia (HCC)    Coronary artery disease involving native coronary artery of native heart without angina pectoris    Steroid-induced hyperglycemia    Diabetic peripheral neuropathy (HCC)    Weakness 03/09/2020   Chest pain 03/08/2020   Right leg numbness    Malignant melanoma of left side of neck (HCC) 10/25/2018   Facial neuritis 10/25/2018   Myofascial pain syndrome 10/25/2018   Occipital neuralgia of left side 10/25/2018   Male hypogonadism  06/20/2016   Renal lesion 06/20/2016   Insomnia 08/07/2015   Enlarged prostate with lower urinary tract symptoms (LUTS) 11/07/2014   Balanitis 07/30/2012   Bladder neck obstruction 07/30/2012   Prostate nodule 06/18/2012   Recurrent nephrolithiasis 06/18/2012   Benign neoplasm of colon 08/29/2011   DEPRESSION, SITUATIONAL, ACUTE 01/23/2010   Essential hypertension 05/30/2009   Coronary atherosclerosis 05/30/2009   Obstructive sleep apnea 11/28/2008   OBESITY 09/23/2008   ERECTILE DYSFUNCTION 11/26/2007   Well controlled type 2 diabetes mellitus with peripheral circulatory disorder (HCC) 05/09/2007   Hyperlipidemia 05/09/2007   MYOCARDIAL INFARCTION, HX OF 05/09/2007   DIVERTICULOSIS, COLON 05/09/2007   PCP:  [name], MD Pharmacy:  No Pharmacies Listed    Social Determinants of Health (SDOH) Social History: SDOH Screenings   Food Insecurity: No Food Insecurity (06/30/2023)  Housing: Low Risk  (06/30/2023)  Transportation Needs: No Transportation Needs (06/30/2023)  Utilities: Not At Risk (06/30/2023)  Depression (PHQ2-9): Low Risk  (12/09/2020)  Financial Resource Strain: Low Risk  (03/06/2020)  Physical Activity: Inactive (12/14/2018)  Social Connections: Unknown (12/14/2018)  Stress: No Stress Concern Present (12/14/2018)  Tobacco Use: Medium Risk (06/30/2023)   SDOH Interventions:     Readmission Risk Interventions    07/02/2023   12:31 PM  Readmission Risk  Prevention Plan  Transportation Screening Complete  Medication Review Oceanographer) Complete  PCP or Specialist appointment within 3-5 days of discharge Not Complete  PCP/Specialist Appt Not Complete comments SNF resident  [facility] or Home Care Consult Complete  SW Recovery Care/Counseling Consult Complete  Palliative Care Screening Not Applicable  Skilled Nursing Facility Complete

## 2023-07-02 NOTE — Progress Notes (Signed)
ABI was attempted, however patient is in surgery at this time.  Will re attempt as schedule and patient availability permit.   07/02/2023 1:11 PM Jayro Mcmath RVT, RDMS

## 2023-07-02 NOTE — Anesthesia Preprocedure Evaluation (Addendum)
Anesthesia Evaluation  Patient identified by MRN, date of birth, ID band Patient awake    Reviewed: Allergy & Precautions, NPO status , Patient's Chart, lab work & pertinent test results, reviewed documented beta blocker date and time   History of Anesthesia Complications Negative for: history of anesthetic complications  Airway Mallampati: II  TM Distance: >3 FB Neck ROM: Full    Dental no notable dental hx. (+) Dental Advisory Given, Teeth Intact   Pulmonary sleep apnea , neg COPD, former smoker   Pulmonary exam normal breath sounds clear to auscultation       Cardiovascular Exercise Tolerance: Poor hypertension, + CAD, + Past MI, + Cardiac Stents and + Peripheral Vascular Disease  (-) CHF and (-) Orthopnea Normal cardiovascular exam Rhythm:Regular Rate:Normal     Neuro/Psych  Headaches PSYCHIATRIC DISORDERS  Depression     Neuromuscular disease    GI/Hepatic ,neg GERD  ,,(+) neg Cirrhosis        Endo/Other  diabetes, Well Controlled, Type 2    Renal/GU Renal disease (stone dz)     Musculoskeletal   Abdominal   Peds  Hematology  (+) Blood dyscrasia, anemia 10.3/265   Anesthesia Other Findings   Reproductive/Obstetrics                             Anesthesia Physical Anesthesia Plan  ASA: 3  Anesthesia Plan: Regional   Post-op Pain Management:    Induction: Intravenous  PONV Risk Score and Plan: 2 and Propofol infusion and Treatment may vary due to age or medical condition  Airway Management Planned: Natural Airway and Nasal Cannula  Additional Equipment: None  Intra-op Plan:   Post-operative Plan:   Informed Consent: I have reviewed the patients History and Physical, chart, labs and discussed the procedure including the risks, benefits and alternatives for the proposed anesthesia with the patient or authorized representative who has indicated his/her understanding and  acceptance.     Dental advisory given  Plan Discussed with: CRNA  Anesthesia Plan Comments: (R Pop + MAC)        Anesthesia Quick Evaluation

## 2023-07-02 NOTE — Progress Notes (Signed)
Called patient wife for OTP consent with 2 nurses d/t patient's confusion. The wife had multiple questions about the procedure/risks and is requesting to speak with surgeon prior to allowing consent. Informed preop and provided wife's number.

## 2023-07-03 ENCOUNTER — Inpatient Hospital Stay (HOSPITAL_COMMUNITY): Payer: Medicare Other

## 2023-07-03 ENCOUNTER — Encounter (HOSPITAL_COMMUNITY): Payer: Self-pay | Admitting: Orthopaedic Surgery

## 2023-07-03 DIAGNOSIS — M009 Pyogenic arthritis, unspecified: Secondary | ICD-10-CM

## 2023-07-03 DIAGNOSIS — L97509 Non-pressure chronic ulcer of other part of unspecified foot with unspecified severity: Secondary | ICD-10-CM | POA: Diagnosis not present

## 2023-07-03 DIAGNOSIS — E11621 Type 2 diabetes mellitus with foot ulcer: Secondary | ICD-10-CM | POA: Diagnosis not present

## 2023-07-03 DIAGNOSIS — M86171 Other acute osteomyelitis, right ankle and foot: Secondary | ICD-10-CM | POA: Diagnosis not present

## 2023-07-03 DIAGNOSIS — E119 Type 2 diabetes mellitus without complications: Secondary | ICD-10-CM | POA: Diagnosis not present

## 2023-07-03 LAB — CBC
HCT: 28.7 % — ABNORMAL LOW (ref 39.0–52.0)
Hemoglobin: 8.7 g/dL — ABNORMAL LOW (ref 13.0–17.0)
MCH: 26 pg (ref 26.0–34.0)
MCHC: 30.3 g/dL (ref 30.0–36.0)
MCV: 85.7 fL (ref 80.0–100.0)
Platelets: 319 10*3/uL (ref 150–400)
RBC: 3.35 MIL/uL — ABNORMAL LOW (ref 4.22–5.81)
RDW: 15.9 % — ABNORMAL HIGH (ref 11.5–15.5)
WBC: 9.5 10*3/uL (ref 4.0–10.5)
nRBC: 0 % (ref 0.0–0.2)

## 2023-07-03 LAB — BASIC METABOLIC PANEL
Anion gap: 8 (ref 5–15)
BUN: 13 mg/dL (ref 8–23)
CO2: 23 mmol/L (ref 22–32)
Calcium: 8.1 mg/dL — ABNORMAL LOW (ref 8.9–10.3)
Chloride: 104 mmol/L (ref 98–111)
Creatinine, Ser: 0.65 mg/dL (ref 0.61–1.24)
GFR, Estimated: >60 mL/min (ref >60)
Glucose, Bld: 173 mg/dL — ABNORMAL HIGH (ref 70–99)
Potassium: 3.4 mmol/L — ABNORMAL LOW (ref 3.5–5.1)
Sodium: 135 mmol/L (ref 135–145)

## 2023-07-03 LAB — GLUCOSE, CAPILLARY
Glucose-Capillary: 157 mg/dL — ABNORMAL HIGH (ref 70–99)
Glucose-Capillary: 266 mg/dL — ABNORMAL HIGH (ref 70–99)
Glucose-Capillary: 194 mg/dL — ABNORMAL HIGH (ref 70–99)
Glucose-Capillary: 244 mg/dL — ABNORMAL HIGH (ref 70–99)

## 2023-07-03 LAB — VAS US ABI WITH/WO TBI
Left ABI: 1.06
Right ABI: 0.96

## 2023-07-03 MED ORDER — LORAZEPAM 0.5 MG PO TABS
0.5000 mg | ORAL_TABLET | Freq: Once | ORAL | Status: AC | PRN
  Administered 2023-07-03: 0.5 mg via ORAL
  Filled 2023-07-03: qty 1

## 2023-07-03 MED ORDER — ENSURE MAX PROTEIN PO LIQD
11.0000 [oz_av] | Freq: Every day | ORAL | Status: DC
  Administered 2023-07-03: 11 [oz_av] via ORAL
  Filled 2023-07-03 (×4): qty 330

## 2023-07-03 MED ORDER — ZINC SULFATE 220 (50 ZN) MG PO CAPS
220.0000 mg | ORAL_CAPSULE | Freq: Every day | ORAL | Status: DC
  Administered 2023-07-03 – 2023-07-06 (×3): 220 mg via ORAL
  Filled 2023-07-03 (×4): qty 1

## 2023-07-03 MED ORDER — POTASSIUM CHLORIDE CRYS ER 20 MEQ PO TBCR
40.0000 meq | EXTENDED_RELEASE_TABLET | ORAL | Status: AC
  Administered 2023-07-03 (×2): 40 meq via ORAL
  Filled 2023-07-03 (×2): qty 2

## 2023-07-03 NOTE — Progress Notes (Signed)
Initial Nutrition Assessment  INTERVENTION:   -Ensure MAX Protein po daily, each supplement provides 150 kcal and 30 grams of protein   -Continue vitamin supplementation -220 mg Zinc sulfate daily x 14 days  NUTRITION DIAGNOSIS:   Increased nutrient needs related to wound healing as evidenced by estimated needs.  GOAL:   Patient will meet greater than or equal to 90% of their needs  MONITOR:   PO intake, Labs, Supplement acceptance, Weight trends, I & O's, Skin  REASON FOR ASSESSMENT:   Consult Wound healing  ASSESSMENT:   77 year old man complex PMH including paraplegia secondary to transverse myelitis, GI fistula, neurogenic bladder status post suprapubic catheter, stage IV decubitus ulcer, colostomy who was sent to the emergency department for evaluation of sacral ulcer and right foot wound, on treatment with ceftriaxone as an outpatient.  He was admitted for suspected right foot osteomyelitis as well as further evaluation of sacral decubitus.  10/13: s/p Right fifth ray amputation   Patient in room, resting in bed. Reports he feels okay. Reports good appetite. No issues eating lately. Noted in chart review, pt was admitted in September 2024 and required nutrition support (TPN).  Pt reports he takes vitamins at home. Already has Vitamin C ordered so will add Zinc sulfate as well to aid in wound healing. Pt is currently consuming 50-80% of meals. Will add daily Ensure Max supplement for additional protein.   Per weight records, pt with no recent weight changes.  Medications: Vitamin C, Vitamin D, Pepcid, Senokot  Labs reviewed: CBGs: 148-266 Low K   NUTRITION - FOCUSED PHYSICAL EXAM:  No depletions noted.  Diet Order:   Diet Order             Diet regular Fluid consistency: Thin  Diet effective now                   EDUCATION NEEDS:   Education needs have been addressed  Skin:  Skin Assessment: Skin Integrity Issues: Skin Integrity Issues:: Stage  IV, Stage III, Stage II Stage II: right foot Stage III: right ischium Stage IV: sacrum  Last BM:  10/14 -type 7  Height:   Ht Readings from Last 1 Encounters:  06/30/23 6' (1.829 m)    Weight:   Wt Readings from Last 1 Encounters:  06/30/23 98 kg    BMI:  Body mass index is 29.29 kg/m.  Estimated Nutritional Needs:   Kcal:  2050-2250  Protein:  100-110g  Fluid:  2L/day  Tilda Franco, MS, RD, LDN Inpatient Clinical Dietitian Contact information available via Amion

## 2023-07-03 NOTE — Consult Note (Signed)
Patient has been seen this admission 10/11 by [facility] nurse  WOC Nurse Consult Note: this patient is familiar to [facility] team from prior admissions with chronic Stage 4 pressure injuries to sacrum and R ischium; It is noted that wound on the right foot was not requested for consultation at the time of his initial admission, he subsequently was seen by orthopedics. Amputation planned for today.    Reason for Consult: Pressure injuries  Wound type: Stage 4 Pressure Injury Sacrum and R ischium  Pressure Injury POA: Yes Measurement: see nursing flowsheets  Wound bed:  1.  Erythema and purple discoloration to entire sacrum/buttocks consistent with chronic tissue damage and healing Stage 4 Pressure Injury; scattered areas of full thickness skin loss that appear pink and moist  2.  R ischial wound; Stage 3 Pressure Injury; full thickness Drainage (amount, consistency, odor) see nursing flowsheet  Periwound: erythema  Dressing procedure/placement/frequency: Cleanse entire sacrum/buttocks with Vashe [name] [phone]) wound cleanser, apply Vashe moistened gauze to open areas sacrum, buttocks and R ischium. Cover R ischial wound with dry gauze and silicone foam.  Coat surrounding skin of buttocks with Gerhardt's butt cream 3 times daily and as needed for soiling.  May cover with ABD pad (would not use silicone foam to sacrum/buttocks as this will hold moisture onto skin).   Patient should be placed on a low air loss mattress for pressure redistribution and moisture management; however he has demanded he be placed back on foam mattress on 07/01/23.    Orthopedics will guide post op surgical care for surgical site.     WOC Nurse ostomy consult note; has been seen by WOC team last 05/22/2023  Stoma type/location: LLQ colostomy  Stomal assessment/size: 1 3/4" round per last note  Peristomal assessment: not assessed  Treatment options for stomal/peristomal skin: 2" barrier ring  Output  Ostomy pouching: 2 piece 2  3/4" skin barrier [name] #2), 2 3/4" pouch [name] [phone]) and 2" barrier ring [name] [phone])      Education provided: none, patient resides at SNF who manages ostomy for him  Enrolled patient in [facility] DC program: no, established ostomy    Re consult if needed, will not follow at this time. Thanks  Jeremie Abdelaziz [employer], RN,CWOCN, CNS, CWON-AP [phone])

## 2023-07-03 NOTE — Op Note (Signed)
07/02/2023  8:16 AM   PATIENT: Brent [name].  77 y.o. male  MRN: [id number]   PRE-OPERATIVE DIAGNOSIS:   Right foot infection with full thickness ulceration in setting of chronic paraplegia   POST-OPERATIVE DIAGNOSIS:   Same   PROCEDURE: Right fifth ray amputation   SURGEON:  Netta Cedars, MD   ASSISTANT: None   ANESTHESIA: General, regional   EBL: Minimal   TOURNIQUET:   * Missing tourniquet times found for documented tourniquets in log: [id number] *   COMPLICATIONS: None apparent   DISPOSITION: Extubated, awake and stable to recovery.   INDICATION FOR PROCEDURE: The patient presented with above diagnosis.  We discussed the diagnosis, alternative treatment options, risks and benefits of the above surgical intervention, as well as alternative non-operative treatments. All questions/concerns were addressed and the patient/family demonstrated appropriate understanding of the diagnosis, the procedure, the postoperative course, and overall prognosis. The patient and his wife wished to proceed with surgical intervention and signed an informed surgical consent by his wife as such, with nurse witness by phone prior to surgery.   PROCEDURE IN DETAIL: After preoperative consent was obtained and the correct operative site was identified, the patient was brought to the operating room supine on stretcher and transferred onto operating table. Anesthesia was induced. Preoperative antibiotics were administered. Surgical timeout was taken. The patient was then positioned supine with an ipsilateral hip bump. The operative lower extremity was prepped and draped in standard sterile fashion with a tourniquet around the thigh. The extremity was exsanguinated and the tourniquet was inflated to 275 mmHg.  We began by making a lateral racquet shaped incision over the 5th toe extending down the shaft of the fifth metatarsal. We incorporated the ulcer and its sinus tract for  complete excision. The metatarsal bone was noted to be severely osteonecrotic. This was excised back to the level of grossly healthy bone. A saw was used to ensure no sharp prominence of the residual bone. Multiple specimens were sent for cultures and pathology.    The surgical sites were thoroughly irrigated with several liters of saline. The tourniquet was deflated and hemostasis achieved. Betadine, Prontosan irrigation solution and vancomycin powder were applied. The deep layers were closed using 2-0 PDS. The skin was closed without tension using 2-0 prolene.    The leg was cleaned with saline and sterile dressings with gauze were applied. A loose compressive wrap was applied. The patient was awakened from anesthesia and transported to the recovery room in stable condition.    FOLLOW UP PLAN: -transfer to PACU, then return to [facility] under hospitalist team -patient is paraplegic, maximum elevation -nursing to do daily dressing change at bedside -Abx and DVT ppx per ID/primary team preference -follow up as outpatient within 7-10 days for wound check -sutures out in 3-4 weeks in outpatient office   RADIOGRAPHS: AP, lateral and oblique radiographs of the right foot were obtained intraoperatively. These showed interval fifth ray amputation. No other acute injuries are noted.   Netta Cedars Orthopaedic Surgery [facility]

## 2023-07-03 NOTE — Progress Notes (Signed)
Progress Note   Patient: Brent [name]. WUJ:[id number] DOB: [birth year] DOA: 06/30/2023     2 DOS: the patient was seen and examined on 07/03/2023   Brief hospital course: 77 year old man complex PMH including paraplegia secondary to transverse myelitis, GI fistula, neurogenic bladder status post suprapubic catheter, stage IV decubitus ulcer, colostomy who was sent to the emergency department for evaluation of sacral ulcer and right foot wound, on treatment with ceftriaxone as an outpatient.  He was admitted for suspected right foot osteomyelitis as well as further evaluation of sacral decubitus.  Imaging confirmed osteomyelitis and patient was seen by orthopedics and underwent right fifth ray amputation.  Eventually plan to return to SNF.  Consultants Orthopedics   Procedures 10/13 Right fifth ray amputation  Assessment and Plan: Diabetic right foot ulcer Acute osteomyelitis right fifth metatarsal Septic arthritis fifth MTP joint MRI fifth MTP joint septic arthritis and osteomyelitis of the fifth metatarsal and fifth proximal phalanx.   S/p 5th ray amputation 10/13 Continue empiric antibiotics today Follow up as outpatient within 7-10 days for wound check Sutures out in 3-4 weeks in outpatient office   Stage IV decubitus ulcer Ischial decubitus ulcer Chronic in nature.  No evidence on admission for infection.  CT imaging showed stable right sided ischial decubitus ulcer, no fluid collections. Wound care consultation appreciated  Left shoulder plain status post surgery Favor muscular in etiology, probably related to surgery and positioning.  No hypoxia, no tachycardia.  Was on Lovenox pre and postop.  Do not suspect VTE.  Will monitor clinically.   Lactic acidosis on admission   Paraplegia secondary to transverse myelitis Neurogenic bladder status post suprapubic catheter   Pyuria Suprapubic catheter Monitor clinically   Repaired gastrocolic fistula Colostomy   DM  type 2 CBG remains stable.  Continue Semglee, sliding scale insulin.  Hold metformin while hospitalized  Hypokalemia Replete   CAD Continue atenolol, atorvastatin   Aortic atherosclerosis Continue statin.      Subjective:  Has some pain in left shoulder, in muscle, some pain with breathing  Physical Exam: Vitals:   07/03/23 0209 07/03/23 0544 07/03/23 1002 07/03/23 1337  BP: 97/73 (!) 108/46 (!) 147/61 (!) 135/51  Pulse: 61 60 (!) 59 (!) 55  Resp: 17 18 17 17   Temp: 98.5 F (36.9 C) 99 F (37.2 C) 98.3 F (36.8 C) 98.1 F (36.7 C)  TempSrc: Oral Oral    SpO2: 95% 94% 96% 98%  Weight:      Height:       Physical Exam Vitals reviewed.  Constitutional:      General: He is not in acute distress.    Appearance: He is not ill-appearing or toxic-appearing.  Cardiovascular:     Rate and Rhythm: Normal rate and regular rhythm.     Heart sounds: No murmur heard. Pulmonary:     Effort: Pulmonary effort is normal. No respiratory distress.     Breath sounds: No wheezing, rhonchi or rales.  Neurological:     Mental Status: He is alert.  Psychiatric:        Mood and Affect: Mood normal.        Behavior: Behavior normal.     Data Reviewed: K+ 3.4 Hgb stable 8.7  Family Communication: none  Disposition: Status is: Inpatient Remains inpatient appropriate because: s/p surgery     Time spent: 20 minutes  Author: Brendia Sacks, MD 07/03/2023 2:19 PM  For on call review [email or web address].

## 2023-07-03 NOTE — Progress Notes (Signed)
ABI's have been completed. Preliminary results can be found in CV Proc through chart review.   07/03/23 9:24 AM Olen Cordial RVT

## 2023-07-03 NOTE — Discharge Instructions (Signed)
Netta Cedars, MD [facility]  Please read the following information regarding your care after surgery.  Dressing Care ? Daily dry dressing change by nursing/wound care with gauze and adaptic  Swelling IMPORTANT: It is normal for you to have swelling where you had surgery. To reduce swelling and pain, keep at least 3 pillows under your leg so that your toes are above your nose and your heel is above the level of your hip.  It may be necessary to keep your foot or leg elevated for several weeks.  This is critical to helping your incisions heal and your pain to feel better.  Follow Up Call my office at [phone] when you are discharged from the hospital or surgery center to schedule an appointment to be seen 7-10 days after surgery.  Call my office at [phone] if you develop a fever >101.5 F, nausea, vomiting, bleeding from the surgical site or severe pain.

## 2023-07-03 NOTE — Progress Notes (Signed)
Subjective:  Patient reports no pain which is baseline for his chronic paraplegia. Stable overnight.  Objective:   VITALS:  Temp:  [97.8 F (36.6 C)-99 F (37.2 C)] 99 F (37.2 C) (10/14 0544) Pulse Rate:  [52-66] 60 (10/14 0544) Resp:  [15-23] 18 (10/14 0544) BP: (97-173)/(43-77) 108/46 (10/14 0544) SpO2:  [94 %-99 %] 94 % (10/14 0544)  Gen: AAOx3, NAD Comfortable at rest  Right Lower Extremity: Skin intact except for full thickness plantar lateral foot ulcer probing to bone No neuro function in right foot (baseline) DP, PT 2+ to palp CR < 2s    LABS Recent Labs    06/30/23 1243 06/30/23 1841 07/01/23 0315 07/02/23 1005 07/03/23 0313  HGB 10.0* 10.0* 8.1* 8.7* 8.7*  WBC 9.4 13.2* 9.6 8.1 9.5  PLT 333 353 299 249 319   Recent Labs    07/02/23 1005 07/03/23 0313  NA 136 135  K 3.7 3.4*  CL 106 104  CO2 22 23  BUN 14 13  CREATININE 0.61 0.65  GLUCOSE 132* 173*   Recent Labs    07/01/23 0315  INR 1.2     Assessment/Plan:  Right foot osteomyelitis with full thickness ulcer in setting of chronic paraplegia  -history and exam discussed with wife and patient -surgery in the form of right foot fifth ray amputation -they understand possible need for further amputation if this surgery should fail to achieve source control now or later -NPO and held VTE ppx since MN last night -cleared by primary team for surgery  Netta Cedars 07/02/2023, 11:31 AM  The risks and benefits were presented and reviewed. The risks due to new/persistent/recurrent infection, stiffness, nerve/vessel/tendon injury, nonunion/malunion of any fracture, wound healing issues, allograft usage, development of arthritis, failure of this surgery, possibility of external fixation in certain situations, possibility of delayed definitive surgery, need for further surgery, prolonged wound care including further soft tissue coverage procedures, thromboembolic events, anesthesia/medical  complications/events perioperatively and beyond, amputation, death among others were discussed. The patient and his wife acknowledged the explanation, agreed to proceed with the plan and a consent was signed by his wife with nurse witness over telephone.

## 2023-07-04 ENCOUNTER — Inpatient Hospital Stay (HOSPITAL_COMMUNITY): Payer: Medicare Other

## 2023-07-04 DIAGNOSIS — L8944 Pressure ulcer of contiguous site of back, buttock and hip, stage 4: Secondary | ICD-10-CM | POA: Diagnosis not present

## 2023-07-04 DIAGNOSIS — M86171 Other acute osteomyelitis, right ankle and foot: Secondary | ICD-10-CM | POA: Diagnosis not present

## 2023-07-04 DIAGNOSIS — G8220 Paraplegia, unspecified: Secondary | ICD-10-CM | POA: Diagnosis not present

## 2023-07-04 DIAGNOSIS — Z9359 Other cystostomy status: Secondary | ICD-10-CM | POA: Diagnosis not present

## 2023-07-04 LAB — GLUCOSE, CAPILLARY
Glucose-Capillary: 140 mg/dL — ABNORMAL HIGH (ref 70–99)
Glucose-Capillary: 135 mg/dL — ABNORMAL HIGH (ref 70–99)
Glucose-Capillary: 136 mg/dL — ABNORMAL HIGH (ref 70–99)
Glucose-Capillary: 156 mg/dL — ABNORMAL HIGH (ref 70–99)

## 2023-07-04 LAB — SURGICAL PATHOLOGY

## 2023-07-04 MED ORDER — MORPHINE SULFATE (PF) 2 MG/ML IV SOLN: 2.0000 mg | INTRAVENOUS | Status: DC | PRN

## 2023-07-04 MED ORDER — OXYCODONE HCL 5 MG PO TABS
5.0000 mg | ORAL_TABLET | Freq: Four times a day (QID) | ORAL | Status: DC | PRN
  Administered 2023-07-04 – 2023-07-06 (×3): 5 mg via ORAL
  Filled 2023-07-04 (×4): qty 1

## 2023-07-04 MED ORDER — PIPERACILLIN-TAZOBACTAM 3.375 G IVPB
3.3750 g | Freq: Three times a day (TID) | INTRAVENOUS | Status: DC
  Administered 2023-07-04 – 2023-07-06 (×7): 3.375 g via INTRAVENOUS
  Filled 2023-07-04 (×7): qty 50

## 2023-07-04 NOTE — Progress Notes (Signed)
Progress Note   Patient: Brent [name]. WUJ:[id number] DOB: 01/08/1946 DOA: 06/30/2023     3 DOS: the patient was seen and examined on 07/04/2023   Brief hospital course: 77 year old man complex PMH including paraplegia secondary to transverse myelitis, GI fistula, neurogenic bladder status post suprapubic catheter, stage IV decubitus ulcer, colostomy who was sent to the emergency department for evaluation of sacral ulcer and right foot wound, on treatment with ceftriaxone as an outpatient.  He was admitted for suspected right foot osteomyelitis as well as further evaluation of sacral decubitus.  Imaging confirmed osteomyelitis and patient was seen by orthopedics and underwent right fifth ray amputation.  Culture still pending but anticipate return to SNF soon.  Consultants Orthopedics   Procedures 10/13 Right fifth ray amputation  Assessment and Plan: Diabetic right foot ulcer Acute osteomyelitis right fifth metatarsal Septic arthritis fifth MTP joint MRI fifth MTP joint septic arthritis and osteomyelitis of the fifth metatarsal and fifth proximal phalanx.   S/p 5th ray amputation 10/13 Change to Zosyn to cover Enterococcus, follow-up culture Follow up as outpatient within 7-10 days for wound check Sutures out in 3-4 weeks in outpatient office   Stage IV decubitus ulcer Ischial decubitus ulcer Chronic in nature.  No evidence on admission for infection.  CT imaging showed stable right sided ischial decubitus ulcer, no fluid collections. Wound care consultation appreciated   Left shoulder pain status post surgery Favor muscular in etiology, probably related to surgery and positioning.  No hypoxia, no tachycardia.  Was on Lovenox pre and postop.  Do not suspect VTE.   Intermittent in nature.  Chest x-ray showed atelectasis.  Left shoulder x-ray negative.  No further evaluation planned at this time.   Lactic acidosis on admission   Paraplegia secondary to transverse  myelitis Neurogenic bladder status post suprapubic catheter   Pyuria Suprapubic catheter Monitor clinically   Repaired gastrocolic fistula Colostomy   DM type 2 CBG remains stable.  Continue Semglee, sliding scale insulin.  Hold metformin while hospitalized   Hypokalemia Replete   CAD Continue atenolol, atorvastatin   Aortic atherosclerosis Continue statin.      Subjective:  Had some left shoulder pain when he woke up. Reported some pleurisy. Pain resolved during examination. No chest pain  Physical Exam: Vitals:   07/04/23 0859 07/04/23 1018 07/04/23 1115 07/04/23 1331  BP: (!) 144/54 (!) 169/76 (!) 139/49 (!) 159/66  Pulse: (!) 57 96 (!) 50 (!) 52  Resp: 18 20 18 18   Temp: 98.2 F (36.8 C) 98.4 F (36.9 C)  97.9 F (36.6 C)  TempSrc: Oral Oral  Oral  SpO2: 98% 97% 100% 100%  Weight:      Height:       Physical Exam Vitals reviewed.  Constitutional:      General: He is not in acute distress.    Appearance: He is not ill-appearing or toxic-appearing.  Cardiovascular:     Rate and Rhythm: Normal rate and regular rhythm.     Heart sounds: No murmur heard. Pulmonary:     Effort: Pulmonary effort is normal. No respiratory distress.     Breath sounds: No wheezing, rhonchi or rales.  Musculoskeletal:        General: Tenderness (mild over anterior and mid deltoid) present.     Comments: Able to lift left arm without difficulty or apparent pain and touch head.  Neurological:     Mental Status: He is alert.  Psychiatric:        Mood  and Affect: Mood normal.        Behavior: Behavior normal.     Data Reviewed: CBG stable Chest x-ray independently reviewed, no acute abnormalities.  Radiology interpretation: Atelectasis. Left shoulder portable film negative EKG sinus bradycardia, no acute changes  Family Communication: none  Disposition: Status is: Inpatient Remains inpatient appropriate because: s/p amputation     Time spent: 20  minutes  Author: Brendia Sacks, MD 07/04/2023 4:30 PM  For on call review [email or web address].

## 2023-07-04 NOTE — Progress Notes (Signed)
Pharmacy Note- Penicillin Allergy Clarification   ASSESSMENT:   PEN-FAST Scoring   Five years or less since last reaction 0  Anaphylaxis/Angioedema OR Severe cutaneous adverse reaction  0  Treatment required for reaction  0  Total Score 0 points - Very low risk of positive penicillin allergy test (<1%)       Impact on therapy (select all that apply): Penicillin allergy removed  Spoke with patient about penicillin allergy history. Patient reported having a rash a long time ago with no other signs/symptoms. Patient has tolerated Augmentin and Unasyn in the past with no issues. Patient said it was okay to remove allergy as he is monitored closely.    Roslyn Smiling, PharmD PGY1 Pharmacy Resident 07/04/2023 11:13 AM

## 2023-07-04 NOTE — Progress Notes (Addendum)
Subjective: 2 Days Post-Op Procedure(s) (LRB): AMPUTATION TOE FIFTH RAY (Right)  Patient reports pain as appropriately controlled. Denies any issues since surgery.  Objective:   VITALS:  Temp:  [97.7 F (36.5 C)-98.4 F (36.9 C)] 97.9 F (36.6 C) (10/15 1331) Pulse Rate:  [50-96] 52 (10/15 1331) Resp:  [17-20] 18 (10/15 1331) BP: (128-169)/(49-76) 159/66 (10/15 1331) SpO2:  [95 %-100 %] 100 % (10/15 1331)  Gen: Comfortable at rest  Right Lower Extremity: Skin intact Incision c/d/I Baseline paraplegic DP, PT 2+ to palp CR < 2s    LABS Recent Labs    07/02/23 1005 07/03/23 0313  HGB 8.7* 8.7*  WBC 8.1 9.5  PLT 249 319   Recent Labs    07/02/23 1005 07/03/23 0313  NA 136 135  K 3.7 3.4*  CL 106 104  CO2 22 23  BUN 14 13  CREATININE 0.61 0.65  GLUCOSE 132* 173*   No results for input(s): "LABPT", "INR" in the last 72 hours.   Assessment/Plan: 2 Days Post-Op Procedure(s) (LRB): AMPUTATION TOE FIFTH RAY (Right)  -stable on RNF under hospitalist team -patient is paraplegic, maximum elevation -nursing to do daily dressing change at bedside (dressing changed by me today during exam) -Abx and DVT ppx per ID/primary team preference -follow up as outpatient within 7-10 days for wound check -sutures out in 3-4 weeks in outpatient office  Netta Cedars 07/04/2023, 5:02 PM

## 2023-07-04 NOTE — Plan of Care (Signed)
  Problem: Education: Goal: Knowledge of General Education information will improve Description: Including pain rating scale, medication(s)/side effects and non-pharmacologic comfort measures Outcome: Progressing   Problem: Health Behavior/Discharge Planning: Goal: Ability to manage health-related needs will improve Outcome: Progressing   Problem: Clinical Measurements: Goal: Ability to maintain clinical measurements within normal limits will improve Outcome: Progressing Goal: Will remain free from infection Outcome: Progressing Goal: Diagnostic test results will improve Outcome: Progressing Goal: Respiratory complications will improve Outcome: Progressing Goal: Cardiovascular complication will be avoided Outcome: Progressing   Problem: Activity: Goal: Risk for activity intolerance will decrease Outcome: Not Applicable   Problem: Nutrition: Goal: Adequate nutrition will be maintained Outcome: Completed/Met   Problem: Coping: Goal: Level of anxiety will decrease Outcome: Progressing   Problem: Elimination: Goal: Will not experience complications related to bowel motility Outcome: Adequate for Discharge Goal: Will not experience complications related to urinary retention Outcome: Adequate for Discharge   Problem: Pain Managment: Goal: General experience of comfort will improve Outcome: Progressing   Problem: Safety: Goal: Ability to remain free from injury will improve Outcome: Progressing   Problem: Skin Integrity: Goal: Risk for impaired skin integrity will decrease Outcome: Progressing

## 2023-07-04 NOTE — Plan of Care (Signed)
  Problem: Education: Goal: Ability to describe self-care measures that may prevent or decrease complications (Diabetes Survival Skills Education) will improve Outcome: Progressing   Problem: Coping: Goal: Ability to adjust to condition or change in health will improve Outcome: Progressing   Problem: Elimination: Goal: Will not experience complications related to bowel motility Outcome: Progressing   Problem: Pain Managment: Goal: General experience of comfort will improve Outcome: Progressing   Problem: Safety: Goal: Ability to remain free from injury will improve Outcome: Progressing   Problem: Skin Integrity: Goal: Risk for impaired skin integrity will decrease Outcome: Progressing

## 2023-07-04 NOTE — Progress Notes (Signed)
   07/04/23 1115  Vitals  BP (!) 139/49  MAP (mmHg) 73  BP Location Right Arm  BP Method Automatic  Patient Position (if appropriate) Lying  Pulse Rate (!) 50  Pulse Rate Source Dinamap  Resp 18  Level of Consciousness  Level of Consciousness Alert  MEWS COLOR  MEWS Score Color Green  Oxygen Therapy  SpO2 100 %  O2 Device Room Air  Patient Activity (if Appropriate) In bed  Pulse Oximetry Type Intermittent  MEWS Score  MEWS Temp 0  MEWS Systolic 0  MEWS Pulse 1  MEWS RR 0  MEWS LOC 0  MEWS Score 1   Pt is complaining of chest pain, he only feels it on top of his chest, he said it comes and goes and he described it as sharp pain when he inhales. Dr. Irene Limbo made aware.  11:18 Dr. Irene Limbo is at bedside. RN received and carried out new orders.  11:27 Pt denies chest pain, not in any distress.  13:00 Pt is resting in bed, alert and oriented x 4, denies SOB, no chest pain at this time.

## 2023-07-05 DIAGNOSIS — M86171 Other acute osteomyelitis, right ankle and foot: Secondary | ICD-10-CM | POA: Diagnosis not present

## 2023-07-05 LAB — CULTURE, BLOOD (ROUTINE X 2)
Culture: NO GROWTH
Culture: NO GROWTH
Special Requests: ADEQUATE
Special Requests: ADEQUATE

## 2023-07-05 LAB — GLUCOSE, CAPILLARY
Glucose-Capillary: 153 mg/dL — ABNORMAL HIGH (ref 70–99)
Glucose-Capillary: 188 mg/dL — ABNORMAL HIGH (ref 70–99)
Glucose-Capillary: 202 mg/dL — ABNORMAL HIGH (ref 70–99)
Glucose-Capillary: 210 mg/dL — ABNORMAL HIGH (ref 70–99)

## 2023-07-05 MED ORDER — LORAZEPAM 2 MG/ML IJ SOLN: 0.5000 mg | INTRAMUSCULAR | Status: DC | PRN

## 2023-07-05 MED ORDER — HALOPERIDOL LACTATE 5 MG/ML IJ SOLN: 1.0000 mg | Freq: Four times a day (QID) | INTRAMUSCULAR | Status: DC | PRN

## 2023-07-05 NOTE — Plan of Care (Signed)
Problem: Education: Goal: Ability to describe self-care measures that may prevent or decrease complications (Diabetes Survival Skills Education) will improve Outcome: Progressing   Problem: Coping: Goal: Level of anxiety will decrease Outcome: Progressing   Problem: Pain Managment: Goal: General experience of comfort will improve Outcome: Progressing

## 2023-07-05 NOTE — Consult Note (Signed)
[facility]    Date of Admission:  06/30/2023   Total days of inpatient antibiotics 5        Reason for Consult: Osteomyelitis    Principal Problem:   Acute osteomyelitis of metatarsal bone of right foot (HCC) Active Problems:   Paraplegia (HCC)   Neurogenic bladder   Lactic acidosis   Suprapubic catheter (HCC)   Stage IV decubitus ulcer (HCC)   Urine leukocytes   Diabetic foot ulcer (HCC)   Septic arthritis of foot (HCC)   Colostomy present (HCC)   DM type 2 (diabetes mellitus, type 2) (HCC)   Aortic atherosclerosis (HCC)   Pressure ulcer   Assessment: 77 year old male with history of transverse myelitis resulting in quadriplegia, diabetes mellitus, history of sacral decubitus ulcer admitted with.  Show ulcers found to have: #Right fifth MTP septic arthritis, fifth metatarsal osteomyelitis status post fifth ray amputation - Patient afebrile on admission but had pressure ulcers of sacrum, right calf, right foot.  MRI right foot showed concern for septic arthritis at fifth MTP and fifth metatarsal osteomyelitis.  Started on vancomycin, ceftriaxone and metronidazole. Orthopedics was engaged and patient underwent right fifth ray amputation on 10/14. -Residual OR cultures are polymicrobial growing Providencia stuartii, Proteus mirabilis, E faecalis, Bacteroides fragilis.  Transition to pip-tazo Recommendations:  -Continue pip-tazo for now - We will have some p.o. options for prolonged antibiotic course - Follow-up or cultures #Sacral decubitus ulcer - Wound care following - CT abdomen pelvis on admission no acute findings. #Diabetes mellitus - A1c 7.5 on 06/06/2023 Microbiology:   Antibiotics: Ceftriaxone 10/11 - 10/14 Cefazolin 10/13 Metronidazole 10/12-10/14 Pip-tazo 10/15-present Vancomycin 10/11 - 10/30  Cultures: Blood 10/11 no growth Urine  Other 10/13 culture from digit growing Providencia stuartii, E faecalis, Proteus  mirabilis Culture from tissue growing Proteus mirabilis, E faecalis, Providencia stuartii, moderate Bacteroides fragilis beta-lactamase positive Culture from tissue growing E faecalis, Bacteroides fragilis, reintubating  HPI: Mihcael Ledee. is a 77 y.o. male history of transverse myelitis and lower extremity quadriplegia, sacral wound treated with Augmentin x 6 weeks back in 2022 with infectious disease, GI fistula status post wedge resection on 9/24, neurogenic bladder status post suprapubic catheter, diabetes mellitus, colostomy in situ with prior hospitalization for poor wound healing after gastric fistula rejection resection done.  Presented right foot pressure ulcer,, right calf pressure injury, sacral diabetes ulcer.  CT abdomen pelvis shows stable right-sided posterior ischial decubitus ulcer extending to nearly bone surface without fluid collection, stable left lower quadrant colostomy.  No acute abdominal or pelvis finding.  MR of the right foot showed foot ulcer base of the fifth metatarsal extending to bone with MTP joint septic arthritis and osteomyelitis.   Review of Systems: Review of Systems  All other systems reviewed and are negative.   Past Medical History:  Diagnosis Date   Acute osteomyelitis of metatarsal bone of right foot (HCC) 07/01/2023   Allergy    Aortic atherosclerosis (HCC) 07/01/2023   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 angioplasty of his RCA in 1990. b. bare metal stent placed in the RCA in 2000 followed by rotational atherectomy shortly after for stent restenosis. c. last cath was in 2012 showing stable moderate diffuse CAD. (70% mid LAD, 80% diagonal, 70% Ramus, 40% mid to distal RCA stent restenosis). d. Low risk nuc in 2015.   Diabetes mellitus    Diverticulosis  Elevated CK    Erectile dysfunction    Hemorrhoids    HTN (hypertension)    Hyperlipidemia    Hypertriglyceridemia    Malignant  melanoma of left side of neck (HCC) 10/25/2018   Myocardial infarction (HCC)    Obesity    OSA (obstructive sleep apnea)    Persistent disorder of initiating or maintaining sleep    Personal history of colonic polyps 02/05/2003   Renal lesion 06/20/2016   Septic arthritis of foot (HCC) 07/01/2023   Stage IV decubitus ulcer (HCC) 06/30/2023    Social History   Tobacco Use   Smoking status: Former    Current packs/day: 0.00    Average packs/day: 1.5 packs/day for 30.0 years (45.0 ttl pk-yrs)    Types: Cigarettes    Start date: 09/19/1950    Quit date: 09/19/1980    Years since quitting: 42.8   Smokeless tobacco: Never  Vaping Use   Vaping status: Never Used  Substance Use Topics   Alcohol use: Not Currently    Alcohol/week: 7.0 - 14.0 standard drinks of alcohol    Types: 7 - 14 Shots of liquor per week   Drug use: No    Family History  Problem Relation Age of Onset   Lung cancer Mother    COPD Mother    Obesity Mother    Liver cancer Father    Lung cancer Father    Heart disease Father    Heart disease Sister    Colon cancer Neg Hx    Stomach cancer Neg Hx    Pancreatic cancer Neg Hx    Esophageal cancer Neg Hx    Inflammatory bowel disease Neg Hx    Liver disease Neg Hx    Rectal cancer Neg Hx    Scheduled Meds:  ascorbic acid  500 mg Oral Daily   atenolol  12.5 mg Oral Daily   And   atenolol  25 mg Oral QHS   atorvastatin  40 mg Oral QHS   baclofen  20 mg Oral TID   cholecalciferol  2,000 Units Oral Daily   DULoxetine  30 mg Oral QHS   enoxaparin (LOVENOX) injection  40 mg Subcutaneous Q24H   famotidine  20 mg Oral BID   Gerhardt's butt cream   Topical TID   insulin aspart  0-15 Units Subcutaneous TID WC   insulin aspart  0-5 Units Subcutaneous QHS   insulin glargine-yfgn  35 Units Subcutaneous BID   lamoTRIgine  50 mg Oral BID   pantoprazole  40 mg Oral BID   Ensure Max Protein  11 oz Oral Daily   senna-docusate  1 tablet Oral BID   sodium chloride  flush  3 mL Intravenous Q12H   traZODone  75 mg Oral QHS   zinc sulfate  220 mg Oral Daily   Continuous Infusions:  piperacillin-tazobactam (ZOSYN)  IV 3.375 g (07/05/23 0555)   PRN Meds:.acetaminophen **OR** acetaminophen, haloperidol lactate, LORazepam, LORazepam, morphine injection, ondansetron, oxyCODONE, polyethylene glycol, polyvinyl alcohol Allergies  Allergen Reactions   Cipro [Ciprofloxacin Hcl] Other (See Comments)    Hallucinations Allergy not listed on MAR   Neurontin [Gabapentin] Other (See Comments)    Unknown reaction   Levaquin [Levofloxacin] Rash    OBJECTIVE: Blood pressure (!) 158/56, pulse 60, temperature 97.7 F (36.5 C), temperature source Oral, resp. rate 17, height 6' (1.829 m), weight 98 kg, SpO2 100%.  Physical Exam  Lab Results Lab Results  Component Value Date   WBC 9.5 07/03/2023  HGB 8.7 (L) 07/03/2023   HCT 28.7 (L) 07/03/2023   MCV 85.7 07/03/2023   PLT 319 07/03/2023    Lab Results  Component Value Date   CREATININE 0.65 07/03/2023   BUN 13 07/03/2023   NA 135 07/03/2023   K 3.4 (L) 07/03/2023   CL 104 07/03/2023   CO2 23 07/03/2023    Lab Results  Component Value Date   ALT 21 06/06/2023   AST 22 06/06/2023   ALKPHOS 86 06/06/2023   BILITOT 0.3 06/06/2023       Danelle Earthly, MD [facility] 07/05/2023, 1:46 PM   I have personally spent 82 minutes involved in face-to-face and non-face-to-face activities for this patient on the day of the visit. Professional time spent includes the following activities: Preparing to see the patient (review of tests), Obtaining and/or reviewing separately obtained history (admission/discharge record), Performing a medically appropriate examination and/or evaluation , Ordering medications/tests/procedures, referring and communicating with other health care professionals, Documenting clinical information in the EMR, Independently interpreting  results (not separately reported), Communicating results to the patient/family/caregiver, Counseling and educating the patient/family/caregiver and Care coordination (not separately reported).

## 2023-07-05 NOTE — Progress Notes (Signed)
PROGRESS NOTE    Brent [name].  GUY:[id number] DOB: Sep 03, 1946 DOA: 06/30/2023 PCP: [name], MD   Brief Narrative:  77 year old male with history of paraplegia secondary to transverse myelitis, GI fistula, neurogenic bladder status post suprapubic catheter, stage IV decubitus ulcer, colostomy was sent for evaluation of sacral ulcer and right foot wound, on treatment with ceftriaxone as an outpatient. He was admitted for suspected right foot osteomyelitis as well as further evaluation of sacral decubitus. Imaging confirmed osteomyelitis and patient was seen by orthopedics and underwent right fifth ray amputation.   Assessment & Plan:   Diabetic right foot ulcer Acute osteomyelitis right fifth metatarsal Septic arthritis fifth MTP joint MRI showed fifth MTP joint septic arthritis and osteomyelitis of the fifth metatarsal and fifth proximal phalanx.   S/p 5th ray amputation 10/13 by orthopedics.  Wound care as per orthopedics recommendations. OR cultures growing procidentia/Proteus/E faecalis.  Sensitivities pending.  Antibiotics switched to Zosyn from 07/04/2023 onwards.  Follow up as outpatient with orthopedics within 7-10 days for wound check Sutures out in 3-4 weeks in outpatient office   Stage IV decubitus ulcer Ischial decubitus ulcer Chronic in nature.  No evidence on admission for infection.  CT imaging showed stable right sided ischial decubitus ulcer, no fluid collections. Wound care consultation appreciated   Left shoulder pain status post surgery Favor muscular in etiology, probably related to surgery and positioning.  No hypoxia, no tachycardia.  Was on Lovenox pre and postop.  Do not suspect VTE.   Intermittent in nature.  Chest x-ray showed atelectasis.  Left shoulder x-ray negative.  No further evaluation planned at this time.   Lactic acidosis on admission  Leukocytosis -Resolved  Anemia of chronic disease -From chronic illnesses.  Hemoglobin stable.   Monitor intermittently.   Paraplegia secondary to transverse myelitis Neurogenic bladder status post suprapubic catheter   Pyuria Suprapubic catheter Monitor clinically   Repaired gastrocolic fistula Colostomy   DM type 2 CBG remains stable.  Continue Semglee, sliding scale insulin.  Hold metformin while hospitalized   Hypokalemia No labs today   CAD Hypertension Hyperlipidemia Continue atenolol, atorvastatin   Aortic atherosclerosis Continue statin.  Goals of care -Oral prognosis is guarded to poor.  Consult palliative care for goals of care discussion.    DVT prophylaxis: Lovenox  Code Status: Full Family Communication: None at bedside Disposition Plan: Status is: Inpatient Remains inpatient appropriate because: Of severity of illness.  Consultants: Orthopedics.  Consult palliative care  Procedures: As above  Antimicrobials:  Anti-infectives (From admission, onward)    Start     Dose/Rate Route Frequency Ordered Stop   07/04/23 1215  piperacillin-tazobactam (ZOSYN) IVPB 3.375 g        3.375 g 12.5 mL/hr over 240 Minutes Intravenous Every 8 hours 07/04/23 1121     07/03/23 0600  ceFAZolin (ANCEF) IVPB 2g/100 mL premix        2 g 200 mL/hr over 30 Minutes Intravenous On call to O.R. 07/02/23 1229 07/02/23 1350   07/03/23 0600  vancomycin (VANCOCIN) IVPB 1000 mg/200 mL premix        1,000 mg 200 mL/hr over 60 Minutes Intravenous On call to O.R. 07/02/23 1229 07/02/23 1434   07/02/23 1426  vancomycin (VANCOCIN) powder  Status:  Discontinued          As needed 07/02/23 1426 07/02/23 1634   07/02/23 1235  vancomycin (VANCOCIN) 1-5 GM/200ML-% IVPB       Note to Pharmacy: Norva Pavlov: cabinet  override      07/02/23 1235 07/02/23 1358   07/02/23 1235  ceFAZolin (ANCEF) 2-4 GM/100ML-% IVPB       Note to Pharmacy: Myrlene Broker M: cabinet override      07/02/23 1235 07/02/23 1351   07/02/23 0000  cefTRIAXone (ROCEPHIN) 2 g in sodium chloride 0.9 % 100 mL IVPB   Status:  Discontinued       Placed in "And" Linked Group   2 g 200 mL/hr over 30 Minutes Intravenous Every 24 hours 07/01/23 1325 07/04/23 1121   07/01/23 1415  metroNIDAZOLE (FLAGYL) tablet 500 mg  Status:  Discontinued       Placed in "And" Linked Group   500 mg Oral Every 12 hours 07/01/23 1325 07/04/23 1121   07/01/23 1000  cefTRIAXone (ROCEPHIN) 1 g in sodium chloride 0.9 % 100 mL IVPB  Status:  Discontinued        1 g 200 mL/hr over 30 Minutes Intravenous Every 24 hours 06/30/23 1749 07/01/23 1325   06/30/23 2200  doxycycline (VIBRA-TABS) tablet 100 mg        100 mg Oral 2 times daily 06/30/23 1757 07/01/23 0909   06/30/23 1500  cefTRIAXone (ROCEPHIN) 1 g in sodium chloride 0.9 % 100 mL IVPB        1 g 200 mL/hr over 30 Minutes Intravenous  Once 06/30/23 1456 06/30/23 1546   06/30/23 1500  vancomycin (VANCOREADY) IVPB 1500 mg/300 mL        1,500 mg 150 mL/hr over 120 Minutes Intravenous  Once 06/30/23 1459 06/30/23 1745        Subjective: Patient seen and examined at bedside.  Poor historian.  Speaking in sentences which do not make much sense.  Nursing staff reported that patient refused medications this morning.  No fever, seizures or vomiting reported.  Objective: Vitals:   07/04/23 1331 07/04/23 2134 07/04/23 2140 07/05/23 0525  BP: (!) 159/66 (!) 155/53  (!) 158/56  Pulse: (!) 52 (!) 55 (!) 52 (!) 51  Resp: 18 17  17   Temp: 97.9 F (36.6 C) 97.7 F (36.5 C)  97.7 F (36.5 C)  TempSrc: Oral Oral  Oral  SpO2: 100% 97%  100%  Weight:      Height:        Intake/Output Summary (Last 24 hours) at 07/05/2023 1104 Last data filed at 07/05/2023 0600 Gross per 24 hour  Intake 540 ml  Output 1400 ml  Net -860 ml   Filed Weights   06/30/23 1206 06/30/23 1212  Weight: 98 kg 98 kg    Examination:  General exam: Appears calm and comfortable.  Elderly male lying in bed.  On room air.  Looks chronically ill and deconditioned.  Patient Respiratory system: Bilateral  decreased breath sounds at bases with scattered crackles Cardiovascular system: S1 & S2 heard, mild intermittent bradycardia present.  Gastrointestinal system: Abdomen is nondistended, soft and nontender. Normal bowel sounds heard. Extremities: No cyanosis, clubbing; trace lower extremity edema present  Central nervous system: Awake, slow to respond, talking in sentences which do not make sense.  No focal neurological deficits. Moving extremities Skin: No rashes, lesions or ulcers.  Right foot dressing present. Psychiatry: Flat affect.  Not agitated.   Data Reviewed: I have personally reviewed following labs and imaging studies  CBC: Recent Labs  Lab 06/30/23 1243 06/30/23 1841 07/01/23 0315 07/02/23 1005 07/03/23 0313  WBC 9.4 13.2* 9.6 8.1 9.5  HGB 10.0* 10.0* 8.1* 8.7* 8.7*  HCT 32.7* 32.9*  26.7* 28.0* 28.7*  MCV 86.1 86.4 87.0 84.6 85.7  PLT 333 353 299 249 319   Basic Metabolic Panel: Recent Labs  Lab 06/30/23 1243 06/30/23 1841 07/01/23 0315 07/02/23 1005 07/03/23 0313  NA 136  --  134* 136 135  K 4.0  --  3.9 3.7 3.4*  CL 102  --  106 106 104  CO2 23  --  18* 22 23  GLUCOSE 206*  --  207* 132* 173*  BUN 18  --  12 14 13   CREATININE 0.71 0.59* 0.41* 0.61 0.65  CALCIUM 8.5*  --  7.7* 7.9* 8.1*   GFR: Estimated Creatinine Clearance: 92.4 mL/min (by C-G formula based on SCr of 0.65 mg/dL). Liver Function Tests: No results for input(s): "AST", "ALT", "ALKPHOS", "BILITOT", "PROT", "ALBUMIN" in the last 168 hours. No results for input(s): "LIPASE", "AMYLASE" in the last 168 hours. No results for input(s): "AMMONIA" in the last 168 hours. Coagulation Profile: Recent Labs  Lab 07/01/23 0315  INR 1.2   Cardiac Enzymes: No results for input(s): "CKTOTAL", "CKMB", "CKMBINDEX", "TROPONINI" in the last 168 hours. BNP (last 3 results) No results for input(s): "PROBNP" in the last 8760 hours. HbA1C: No results for input(s): "HGBA1C" in the last 72 hours. CBG: Recent  Labs  Lab 07/04/23 0753 07/04/23 1125 07/04/23 1711 07/04/23 2132 07/05/23 0729  GLUCAP 140* 135* 136* 156* 153*   Lipid Profile: No results for input(s): "CHOL", "HDL", "LDLCALC", "TRIG", "CHOLHDL", "LDLDIRECT" in the last 72 hours. Thyroid Function Tests: No results for input(s): "TSH", "T4TOTAL", "FREET4", "T3FREE", "THYROIDAB" in the last 72 hours. Anemia Panel: No results for input(s): "VITAMINB12", "FOLATE", "FERRITIN", "TIBC", "IRON", "RETICCTPCT" in the last 72 hours. Sepsis Labs: Recent Labs  Lab 06/30/23 1255 06/30/23 1430 06/30/23 1437 07/01/23 0315  LATICACIDVEN 2.5* 2.2* 2.3* 5.8*    Recent Results (from the past 240 hour(s))  Blood culture (routine x 2)     Status: None   Collection Time: 06/30/23 12:45 PM   Specimen: Site Not Specified; Blood  Result Value Ref Range Status   Specimen Description   Final    SITE NOT SPECIFIED Performed at [facility], [address], [place], [place] [address]    Special Requests   Final    BOTTLES DRAWN AEROBIC AND ANAEROBIC Blood Culture adequate volume Performed at [facility], [address], [place], [place] [address]    Culture   Final    NO GROWTH 5 DAYS Performed at [facility], [address], [place], [place] [address]    Report Status 07/05/2023 FINAL  Final  Blood culture (routine x 2)     Status: None   Collection Time: 06/30/23 12:49 PM   Specimen: BLOOD RIGHT FOREARM  Result Value Ref Range Status   Specimen Description   Final    BLOOD RIGHT FOREARM Performed at [facility], [address], [place], [place] [address]    Special Requests   Final    BOTTLES DRAWN AEROBIC AND ANAEROBIC Blood Culture adequate volume Performed at [facility], [address], [place], [place] [address]    Culture   Final    NO GROWTH 5 DAYS Performed at [facility], [address] [place], [place] [address]    Report Status 07/05/2023  FINAL  Final  Aerobic/Anaerobic Culture w Gram Stain (surgical/deep wound)     Status: None (Preliminary result)   Collection Time: 07/02/23  2:13 PM  Specimen: Path Tissue  Result Value Ref Range Status   Specimen Description   Final    TISSUE Performed at [facility], [address], [place], [place] [address]    Special Requests   Final    NONE Performed at [facility], [address] [place], [place] [address]    Gram Stain   Final    FEW WBC PRESENT,BOTH PMN AND MONONUCLEAR RARE GRAM POSITIVE COCCI IN PAIRS IN SINGLES    Culture   Final    RARE ENTEROCOCCUS FAECALIS CULTURE REINCUBATED FOR BETTER GROWTH HOLDING FOR POSSIBLE ANAEROBE Performed at [facility], [address] [place], [place] [address]    Report Status PENDING  Incomplete  Aerobic/Anaerobic Culture w Gram Stain (surgical/deep wound)     Status: None (Preliminary result)   Collection Time: 07/02/23  2:19 PM   Specimen: Path Tissue  Result Value Ref Range Status   Specimen Description   Final    TISSUE Performed at [facility], [address], [place], [place] [address]    Special Requests   Final    NONE Performed at [facility], [address] [place], [place] [address]    Gram Stain   Final    RARE WBC PRESENT, PREDOMINANTLY MONONUCLEAR FEW GRAM POSITIVE COCCI IN PAIRS RARE GRAM NEGATIVE RODS    Culture   Final    FEW PROTEUS MIRABILIS FEW ENTEROCOCCUS FAECALIS RARE PROVIDENCIA STUARTII SUSCEPTIBILITIES TO FOLLOW HOLDING FOR POSSIBLE ANAEROBE Performed at [facility], [address], [place], [place] [address]    Report Status PENDING  Incomplete  Aerobic/Anaerobic Culture w Gram Stain (surgical/deep wound)     Status: None (Preliminary result)   Collection Time: 07/02/23  2:51 PM   Specimen: PATH Digit amputation; Tissue  Result Value Ref Range Status   Specimen Description   Final     DIGIT Performed at [facility], [address] [place], [place] [address]    Special Requests   Final    NONE Performed at [facility], [address], [place], [place] [address]    Gram Stain   Final    FEW WBC PRESENT,BOTH PMN AND MONONUCLEAR FEW GRAM POSITIVE COCCI IN PAIRS IN SINGLES    Culture   Final    FEW PROVIDENCIA STUARTII FEW ENTEROCOCCUS FAECALIS FEW PROTEUS MIRABILIS NO ANAEROBES ISOLATED Performed at [facility], [address], [place], [place] [address]    Report Status PENDING  Incomplete         Radiology Studies: DG Shoulder Left Port  Result Date: 07/04/2023 CLINICAL DATA:  Left shoulder pain EXAM: LEFT SHOULDER COMPARISON:  None Available. FINDINGS: There is no evidence of fracture or dislocation. Mild-moderate osteoarthritic changes of the glenohumeral and acromioclavicular joints. Soft tissues are unremarkable. 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 No acute findings. Electronically Signed   By: Duanne Guess D.O.   On: 07/04/2023 15:34   DG Chest 1 View  Result Date: 07/04/2023 CLINICAL DATA:  [id number] SOB (shortness of breath) [id number] Left shoulder pain [id number] Pleuritic pain 24401 EXAM: CHEST  1 VIEW COMPARISON:  CXR 05/27/23 FINDINGS: Cardiomegaly. Likely a small left pleural effusion. There is a hazy opacity at the left lung base could represent atelectasis or infection. No radiographically apparent displaced rib fracture. Visualized upper abdomen unremarkable. IMPRESSION: 1. Hazy opacity at the left lung base could represent atelectasis or infection.  2. Likely a small left pleural effusion. Electronically Signed   By: Lorenza Cambridge M.D.   On: 07/04/2023 14:25        Scheduled Meds:  ascorbic acid  500 mg Oral Daily   atenolol  12.5 mg Oral Daily   And   atenolol  25 mg Oral QHS   atorvastatin  40 mg Oral QHS   baclofen  20 mg Oral TID   cholecalciferol   2,000 Units Oral Daily   DULoxetine  30 mg Oral QHS   enoxaparin (LOVENOX) injection  40 mg Subcutaneous Q24H   famotidine  20 mg Oral BID   Gerhardt's butt cream   Topical TID   insulin aspart  0-15 Units Subcutaneous TID WC   insulin aspart  0-5 Units Subcutaneous QHS   insulin glargine-yfgn  35 Units Subcutaneous BID   lamoTRIgine  50 mg Oral BID   pantoprazole  40 mg Oral BID   Ensure Max Protein  11 oz Oral Daily   senna-docusate  1 tablet Oral BID   sodium chloride flush  3 mL Intravenous Q12H   traZODone  75 mg Oral QHS   zinc sulfate  220 mg Oral Daily   Continuous Infusions:  piperacillin-tazobactam (ZOSYN)  IV 3.375 g (07/05/23 0555)          Glade Lloyd, MD [facility] 07/05/2023, 11:04 AM

## 2023-07-06 ENCOUNTER — Other Ambulatory Visit: Payer: Self-pay

## 2023-07-06 ENCOUNTER — Ambulatory Visit: Payer: Medicare Other | Admitting: Internal Medicine

## 2023-07-06 DIAGNOSIS — M86171 Other acute osteomyelitis, right ankle and foot: Secondary | ICD-10-CM | POA: Diagnosis not present

## 2023-07-06 LAB — BASIC METABOLIC PANEL
Anion gap: 8 (ref 5–15)
BUN: 8 mg/dL (ref 8–23)
CO2: 24 mmol/L (ref 22–32)
Calcium: 7.7 mg/dL — ABNORMAL LOW (ref 8.9–10.3)
Chloride: 100 mmol/L (ref 98–111)
Creatinine, Ser: 0.63 mg/dL (ref 0.61–1.24)
GFR, Estimated: >60 mL/min (ref >60)
Glucose, Bld: 223 mg/dL — ABNORMAL HIGH (ref 70–99)
Potassium: 3.3 mmol/L — ABNORMAL LOW (ref 3.5–5.1)
Sodium: 132 mmol/L — ABNORMAL LOW (ref 135–145)

## 2023-07-06 LAB — AEROBIC/ANAEROBIC CULTURE W GRAM STAIN (SURGICAL/DEEP WOUND)

## 2023-07-06 LAB — CBC WITH DIFFERENTIAL/PLATELET
Abs Immature Granulocytes: 0.04 10*3/uL (ref 0.00–0.07)
Basophils Absolute: 0 10*3/uL (ref 0.0–0.1)
Basophils Relative: 0 %
Eosinophils Absolute: 0.2 10*3/uL (ref 0.0–0.5)
Eosinophils Relative: 3 %
HCT: 27.2 % — ABNORMAL LOW (ref 39.0–52.0)
Hemoglobin: 8.2 g/dL — ABNORMAL LOW (ref 13.0–17.0)
Immature Granulocytes: 1 %
Lymphocytes Relative: 22 %
Lymphs Abs: 1.4 10*3/uL (ref 0.7–4.0)
MCH: 25.8 pg — ABNORMAL LOW (ref 26.0–34.0)
MCHC: 30.1 g/dL (ref 30.0–36.0)
MCV: 85.5 fL (ref 80.0–100.0)
Monocytes Absolute: 0.6 10*3/uL (ref 0.1–1.0)
Monocytes Relative: 9 %
Neutro Abs: 4.1 10*3/uL (ref 1.7–7.7)
Neutrophils Relative %: 65 %
Platelets: 332 10*3/uL (ref 150–400)
RBC: 3.18 MIL/uL — ABNORMAL LOW (ref 4.22–5.81)
RDW: 15.8 % — ABNORMAL HIGH (ref 11.5–15.5)
WBC: 6.2 10*3/uL (ref 4.0–10.5)
nRBC: 0 % (ref 0.0–0.2)

## 2023-07-06 LAB — GLUCOSE, CAPILLARY
Glucose-Capillary: 142 mg/dL — ABNORMAL HIGH (ref 70–99)
Glucose-Capillary: 149 mg/dL — ABNORMAL HIGH (ref 70–99)
Glucose-Capillary: 213 mg/dL — ABNORMAL HIGH (ref 70–99)

## 2023-07-06 LAB — C-REACTIVE PROTEIN: CRP: 4.6 mg/dL — ABNORMAL HIGH (ref <1.0)

## 2023-07-06 LAB — MAGNESIUM: Magnesium: 1.4 mg/dL — ABNORMAL LOW (ref 1.7–2.4)

## 2023-07-06 MED ORDER — LAMOTRIGINE 25 MG PO TABS: 50.0000 mg | ORAL_TABLET | Freq: Two times a day (BID) | ORAL | 0 refills | Status: DC

## 2023-07-06 MED ORDER — CHLORHEXIDINE GLUCONATE CLOTH 2 % EX PADS
6.0000 | MEDICATED_PAD | Freq: Every day | CUTANEOUS | Status: DC
  Administered 2023-07-06: 6 via TOPICAL

## 2023-07-06 MED ORDER — OXYCODONE HCL 5 MG PO TABS: 5.0000 mg | ORAL_TABLET | Freq: Two times a day (BID) | ORAL | 0 refills | Status: DC | PRN

## 2023-07-06 MED ORDER — PIPERACILLIN-TAZOBACTAM IV (FOR PTA / DISCHARGE USE ONLY): 3.3750 g | Freq: Four times a day (QID) | INTRAVENOUS | 0 refills | Status: AC

## 2023-07-06 MED ORDER — POTASSIUM CHLORIDE CRYS ER 20 MEQ PO TBCR
40.0000 meq | EXTENDED_RELEASE_TABLET | Freq: Once | ORAL | Status: AC
  Administered 2023-07-06: 40 meq via ORAL
  Filled 2023-07-06: qty 2

## 2023-07-06 MED ORDER — SODIUM CHLORIDE 0.9% FLUSH: 10.0000 mL | INTRAVENOUS | Status: DC | PRN

## 2023-07-06 MED ORDER — POLYETHYLENE GLYCOL 3350 17 G PO PACK: 17.0000 g | PACK | Freq: Every day | ORAL | Status: DC | PRN

## 2023-07-06 MED ORDER — MAGNESIUM SULFATE 2 GM/50ML IV SOLN
2.0000 g | Freq: Once | INTRAVENOUS | Status: AC
  Administered 2023-07-06: 2 g via INTRAVENOUS
  Filled 2023-07-06: qty 50

## 2023-07-06 MED ORDER — SODIUM CHLORIDE 0.9% FLUSH: 10.0000 mL | Freq: Two times a day (BID) | INTRAVENOUS | Status: DC

## 2023-07-06 MED ORDER — ZINC SULFATE 220 (50 ZN) MG PO CAPS: 220.0000 mg | ORAL_CAPSULE | Freq: Every day | ORAL | 0 refills | Status: AC

## 2023-07-06 MED ORDER — LORAZEPAM 0.5 MG PO TABS: 0.5000 mg | ORAL_TABLET | Freq: Every day | ORAL | 0 refills | Status: DC | PRN

## 2023-07-06 NOTE — Progress Notes (Signed)
[facility]  Date of Admission:  06/30/2023   Total days of inpatient antibiotics 6  Principal Problem:   Acute osteomyelitis of metatarsal bone of right foot (HCC) Active Problems:   Paraplegia (HCC)   Neurogenic bladder   Lactic acidosis   Suprapubic catheter (HCC)   Stage IV decubitus ulcer (HCC)   Urine leukocytes   Diabetic foot ulcer (HCC)   Septic arthritis of foot (HCC)   Colostomy present (HCC)   DM type 2 (diabetes mellitus, type 2) (HCC)   Aortic atherosclerosis (HCC)   Pressure ulcer          Assessment: 77 year old male with history of transverse myelitis resulting in quadriplegia, diabetes mellitus, history of sacral decubitus ulcer admitted with.  Show ulcers found to have: #Right fifth MTP septic arthritis, fifth metatarsal osteomyelitis status post fifth ray amputation - Patient afebrile on admission but had pressure ulcers of sacrum, right calf, right foot.  MRI right foot showed concern for septic arthritis at fifth MTP and fifth metatarsal osteomyelitis.  Started on vancomycin, ceftriaxone and metronidazole. Orthopedics was engaged and patient underwent right fifth ray amputation on 10/14. -Residual OR cultures are polymicrobial growing Providencia stuartii, Proteus mirabilis, E faecalis, Bacteroides fragilis.  Transitioned to pip-tazo Recommendations:  -Continue pip-tazo  to complete 6 weeks of antibiotics from OR - PICC line orders placed -F/U with ID myself on 11/12   #Sacral decubitus ulcer - Wound care following - CT abdomen pelvis on admission no acute findings.   #Diabetes mellitus - A1c 7.5 on 06/06/2023 -Glycemic control for optimal wound healing  ID will sign off OPAT ORDERS:  Diagnosis: osteo of right foot  Culture Result: polymicrobila  Allergies  Allergen Reactions   Cipro [Ciprofloxacin Hcl] Other (See Comments)    Hallucinations Allergy not listed on MAR   Neurontin [Gabapentin] Other (See  Comments)    Unknown reaction   Levaquin [Levofloxacin] Rash     Discharge antibiotics to be given via PICC line:  Per pharmacy protocol pip-tazo 3.375 gm q 8h   Duration: 6 weeks End Date: 11/25  [facility] Care Per Protocol with [employer] Use: Home health RN for IV administration and teaching, line care and labs.    Labs weekly while on IV antibiotics: __x CBC with differential __ BMP **TWICE WEEKLY ON VANCOMYCIN  _x_ CMP _x_ CRP __ ESR __ Vancomycin trough TWICE WEEKLY __ CK  _x_ Please pull PIC at completion of IV antibiotics __ Please leave PIC in place until doctor has seen patient or been notified  Fax weekly labs to [phone]  Clinic Follow Up Appt: 11/12  @ [facility] with Dr. Thedore Mins  Microbiology:   Antibiotics: Ceftriaxone 10/11 - 10/14 Cefazolin 10/13 Metronidazole 10/12-10/14 Pip-tazo 10/15-present Vancomycin 10/11 - 10/30   Cultures: Blood 10/11 no growth Urine   Other 10/13 culture from digit growing Providencia stuartii, E faecalis, Proteus mirabilis Culture from tissue growing Proteus mirabilis, E faecalis, Providencia stuartii, moderate Bacteroides fragilis beta-lactamase positive Culture from tissue growing E faecalis, Bacteroides fragilis,   SUBJECTIVE: Resting in bed.  Discussed plan as above.  Patient reports that he is going to rehab. Interval: overall overnight.  WBC 6.2K. Review of Systems: Review of Systems  All other systems reviewed and are negative.    Scheduled Meds:  ascorbic acid  500 mg Oral Daily   atenolol  12.5 mg Oral Daily   And   atenolol  25 mg Oral QHS  atorvastatin  40 mg Oral QHS   baclofen  20 mg Oral TID   cholecalciferol  2,000 Units Oral Daily   DULoxetine  30 mg Oral QHS   enoxaparin (LOVENOX) injection  40 mg Subcutaneous Q24H   famotidine  20 mg Oral BID   Gerhardt's butt cream   Topical TID   insulin aspart  0-15 Units Subcutaneous TID WC   insulin aspart  0-5 Units Subcutaneous QHS   insulin  glargine-yfgn  35 Units Subcutaneous BID   lamoTRIgine  50 mg Oral BID   pantoprazole  40 mg Oral BID   Ensure Max Protein  11 oz Oral Daily   senna-docusate  1 tablet Oral BID   sodium chloride flush  3 mL Intravenous Q12H   traZODone  75 mg Oral QHS   zinc sulfate  220 mg Oral Daily   Continuous Infusions:  magnesium sulfate bolus IVPB     piperacillin-tazobactam (ZOSYN)  IV 3.375 g (07/06/23 0747)   PRN Meds:.acetaminophen **OR** acetaminophen, haloperidol lactate, LORazepam, LORazepam, morphine injection, ondansetron, oxyCODONE, polyethylene glycol, polyvinyl alcohol Allergies  Allergen Reactions   Cipro [Ciprofloxacin Hcl] Other (See Comments)    Hallucinations Allergy not listed on MAR   Neurontin [Gabapentin] Other (See Comments)    Unknown reaction   Levaquin [Levofloxacin] Rash    OBJECTIVE: Vitals:   07/05/23 1134 07/05/23 1514 07/05/23 2334 07/06/23 0930  BP:  (!) 154/79 (!) 148/54 (!) 154/65  Pulse: 60 (!) 57 60 62  Resp:  16 19   Temp:  98 F (36.7 C) 98.9 F (37.2 C)   TempSrc:      SpO2:   97%   Weight:      Height:       Body mass index is 29.29 kg/m.  Physical Exam Constitutional:      General: He is not in acute distress.    Appearance: He is normal weight. He is not toxic-appearing.  HENT:     Head: Normocephalic and atraumatic.     Right Ear: External ear normal.     Left Ear: External ear normal.     Nose: No congestion or rhinorrhea.     Mouth/Throat:     Mouth: Mucous membranes are moist.     Pharynx: Oropharynx is clear.  Eyes:     Extraocular Movements: Extraocular movements intact.     Conjunctiva/sclera: Conjunctivae normal.     Pupils: Pupils are equal, round, and reactive to light.  Cardiovascular:     Rate and Rhythm: Normal rate and regular rhythm.     Heart sounds: No murmur heard.    No friction rub. No gallop.  Pulmonary:     Effort: Pulmonary effort is normal.     Breath sounds: Normal breath sounds.  Abdominal:      General: Abdomen is flat. Bowel sounds are normal.     Palpations: Abdomen is soft.  Musculoskeletal:        General: No swelling. Normal range of motion.     Cervical back: Normal range of motion and neck supple.  Skin:    General: Skin is warm and dry.  Neurological:     General: No focal deficit present.     Mental Status: He is oriented to person, place, and time.  Psychiatric:        Mood and Affect: Mood normal.       Lab Results Lab Results  Component Value Date   WBC 6.2 07/06/2023   HGB 8.2 (L)  07/06/2023   HCT 27.2 (L) 07/06/2023   MCV 85.5 07/06/2023   PLT 332 07/06/2023    Lab Results  Component Value Date   CREATININE 0.63 07/06/2023   BUN 8 07/06/2023   NA 132 (L) 07/06/2023   K 3.3 (L) 07/06/2023   CL 100 07/06/2023   CO2 24 07/06/2023    Lab Results  Component Value Date   ALT 21 06/06/2023   AST 22 06/06/2023   ALKPHOS 86 06/06/2023   BILITOT 0.3 06/06/2023        Danelle Earthly, MD [facility] 07/06/2023, 10:35 AM   I have personally spent 52 minutes involved in face-to-face and non-face-to-face activities for this patient on the day of the visit. Professional time spent includes the following activities: Preparing to see the patient (review of tests), Obtaining and/or reviewing separately obtained history (admission/discharge record), Performing a medically appropriate examination and/or evaluation , Ordering medications/tests/procedures, referring and communicating with other health care professionals, Documenting clinical information in the EMR, Independently interpreting results (not separately reported), Communicating results to the patient/family/caregiver, Counseling and educating the patient/family/caregiver and Care coordination (not separately reported).

## 2023-07-06 NOTE — Consult Note (Signed)
Consultation Note Date: 07/06/2023   Patient Name: Brent [name].  DOB: Feb 02, 1946  MRN: [id number]  Age / Sex: 77 y.o., male  PCP: [name], MD Referring Physician: Glade Lloyd, MD  Reason for Consultation: Establishing goals of care  HPI/Patient Profile: 77 y.o. male  admitted on 06/30/2023   77 year old male with history of paraplegia secondary to transverse myelitis, GI fistula, neurogenic bladder status post suprapubic catheter, stage IV decubitus ulcer, colostomy was sent for evaluation of sacral ulcer and right foot wound, on treatment with ceftriaxone as an outpatient. He was admitted for suspected right foot osteomyelitis as well as further evaluation of sacral decubitus. Imaging confirmed osteomyelitis and patient was seen by orthopedics and underwent right fifth ray amputation.  Subsequently ID was consulted and ID has recommended 6 weeks of IV Zosyn.  Clinical Assessment and Goals of Care:  PMT consulted for goals of care.   Chart reviewed, patient seen and reviewed, call placed, unable to reach wife.  Palliative medicine is specialized medical care for people living with serious illness. It focuses on providing relief from the symptoms and stress of a serious illness. The goal is to improve quality of life for both the patient and the family. Goals of care: Broad aims of medical therapy in relation to the patient's values and preferences. Our aim is to provide medical care aimed at enabling patients to achieve the goals that matter most to them, given the circumstances of their particular medical situation and their constraints.    NEXT OF KIN  wife  SUMMARY OF RECOMMENDATIONS    MOST form which was completed earlier has been reviewed, patient's wishes are for FULL CODE, Full Scope of care. He is to be discharged today to SNF, to have extended course of antibiotics and to follow  up with ID in the outpatient setting.   Code Status/Advance Care Planning: Full code   Symptom Management:     Palliative Prophylaxis:  Delirium Protocol    Psycho-social/Spiritual:  Desire for further Chaplaincy support:yes Additional Recommendations: Caregiving  Support/Resources  Prognosis:  Unable to determine  Discharge Planning: Skilled Nursing Facility for rehab with Palliative care service follow-up      Primary Diagnoses: Present on Admission:  Stage IV decubitus ulcer (HCC)  Lactic acidosis  Paraplegia (HCC)  Neurogenic bladder  Pressure ulcer   I have reviewed the medical record, interviewed the patient and family, and examined the patient. The following aspects are pertinent.  Past Medical History:  Diagnosis Date   Acute osteomyelitis of metatarsal bone of right foot (HCC) 07/01/2023   Allergy    Aortic atherosclerosis (HCC) 07/01/2023   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 angioplasty of his RCA in 1990. b. bare metal stent placed in the RCA in 2000 followed by rotational atherectomy shortly after for stent restenosis. c. last cath was in 2012 showing stable moderate diffuse CAD. (70% mid LAD, 80% diagonal, 70% Ramus, 40% mid to distal RCA stent  restenosis). d. Low risk nuc in 2015.   Diabetes mellitus    Diverticulosis    Elevated CK    Erectile dysfunction    Hemorrhoids    HTN (hypertension)    Hyperlipidemia    Hypertriglyceridemia    Malignant melanoma of left side of neck (HCC) 10/25/2018   Myocardial infarction (HCC)    Obesity    OSA (obstructive sleep apnea)    Persistent disorder of initiating or maintaining sleep    Personal history of colonic polyps 02/05/2003   Renal lesion 06/20/2016   Septic arthritis of foot (HCC) 07/01/2023   Stage IV decubitus ulcer (HCC) 06/30/2023   Social History   Socioeconomic History   Marital status: Married    Spouse name: Not on file   Number  of children: 1   Years of education: Not on file   Highest education level: Not on file  Occupational History   Occupation: post-master    Comment: [employer]  Tobacco Use   Smoking status: Former    Current packs/day: 0.00    Average packs/day: 1.5 packs/day for 30.0 years (45.0 ttl pk-yrs)    Types: Cigarettes    Start date: 09/19/1950    Quit date: 09/19/1980    Years since quitting: 42.8   Smokeless tobacco: Never  Vaping Use   Vaping status: Never Used  Substance and Sexual Activity   Alcohol use: Not Currently    Alcohol/week: 7.0 - 14.0 standard drinks of alcohol    Types: 7 - 14 Shots of liquor per week   Drug use: No   Sexual activity: Not Currently    Partners: Female  Other Topics Concern   Not on file  Social History Narrative   12/14/2018: Lives with wife and 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 (son had passed away several years ago)   Recevies much of his care through [place]   Enjoys golfing   Social Determinants of Health   Financial Resource Strain: Low Risk  (03/06/2020)   Overall Financial Resource Strain (CARDIA)    Difficulty of Paying Living Expenses: Not hard at all  Food Insecurity: No Food Insecurity (06/30/2023)   Hunger Vital Sign    Worried About Running Out of Food in the Last Year: Never true    Ran Out of Food in the Last Year: Never true  Transportation Needs: No Transportation Needs (06/30/2023)   PRAPARE - Administrator, Civil Service (Medical): No    Lack of Transportation (Non-Medical): No  Physical Activity: Inactive (12/14/2018)   Exercise Vital Sign    Days of Exercise per Week: 0 days    Minutes of Exercise per Session: 0 min  Stress: No Stress Concern Present (12/14/2018)   [employer] of Occupational Health - Occupational Stress Questionnaire    Feeling of Stress : Not at all  Social Connections: Unknown (12/14/2018)   Social Connection and Isolation Panel [NHANES]    Frequency of Communication with Friends and Family: Once a  week    Frequency of Social Gatherings with Friends and Family: Never    Attends Religious Services: Not on Marketing executive or Organizations: Not on file    Attends Banker Meetings: Not on file    Marital Status: Married   Family History  Problem Relation Age of Onset   Lung cancer Mother    COPD Mother    Obesity Mother    Liver cancer Father    Lung cancer Father  Heart disease Father    Heart disease Sister    Colon cancer Neg Hx    Stomach cancer Neg Hx    Pancreatic cancer Neg Hx    Esophageal cancer Neg Hx    Inflammatory bowel disease Neg Hx    Liver disease Neg Hx    Rectal cancer Neg Hx    Scheduled Meds:  ascorbic acid  500 mg Oral Daily   atenolol  12.5 mg Oral Daily   And   atenolol  25 mg Oral QHS   atorvastatin  40 mg Oral QHS   baclofen  20 mg Oral TID   cholecalciferol  2,000 Units Oral Daily   DULoxetine  30 mg Oral QHS   enoxaparin (LOVENOX) injection  40 mg Subcutaneous Q24H   famotidine  20 mg Oral BID   Gerhardt's butt cream   Topical TID   insulin aspart  0-15 Units Subcutaneous TID WC   insulin aspart  0-5 Units Subcutaneous QHS   insulin glargine-yfgn  35 Units Subcutaneous BID   lamoTRIgine  50 mg Oral BID   pantoprazole  40 mg Oral BID   Ensure Max Protein  11 oz Oral Daily   senna-docusate  1 tablet Oral BID   sodium chloride flush  3 mL Intravenous Q12H   traZODone  75 mg Oral QHS   zinc sulfate  220 mg Oral Daily   Continuous Infusions:  piperacillin-tazobactam (ZOSYN)  IV 3.375 g (07/06/23 1335)   PRN Meds:.acetaminophen **OR** acetaminophen, haloperidol lactate, LORazepam, LORazepam, morphine injection, ondansetron, oxyCODONE, polyethylene glycol, polyvinyl alcohol Medications Prior to Admission:  Prior to Admission medications   Medication Sig Start Date End Date Taking? Authorizing Provider  acetaminophen (TYLENOL) 500 MG tablet Take 2 tablets (1,000 mg total) by mouth every 6 (six) hours as needed  for mild pain (or Fever >/= 101). 05/31/23  Yes Rhetta Mura, MD  atenolol (TENORMIN) 25 MG tablet Take 12.5-25 mg by mouth See admin instructions. Take 12.5 mg (1/2 tablet) by mouth once daily in the morning and 25 mg (1 tablet) at bedtime.   Yes [provider]  atorvastatin (LIPITOR) 40 MG tablet Take 1 tablet (40 mg total) by mouth at bedtime. 05/31/23  Yes Rhetta Mura, MD  baclofen (LIORESAL) 20 MG tablet Take 1 tablet (20 mg total) by mouth 3 (three) times daily. 06/06/23  Yes Amin, Ankit C, MD  cholecalciferol (VITAMIN D3) 25 MCG (1000 UNIT) tablet Take 2,000 Units by mouth daily.   Yes [provider]  doxycycline (MONODOX) 100 MG capsule Take 100 mg by mouth 2 (two) times daily.   Yes [provider]  DULoxetine (CYMBALTA) 30 MG capsule Take 1 capsule (30 mg total) by mouth at bedtime. 05/31/23  Yes Rhetta Mura, MD  famotidine (PEPCID) 20 MG tablet Take 20 mg by mouth 2 (two) times daily.   Yes [provider]  insulin aspart (NOVOLOG) 100 UNIT/ML injection Inject 0-15 Units into the skin every 4 (four) hours. Sliding scale  CBG 70 - 120: 0 units: CBG 121 - 150: 2 units; CBG 151 - 200: 3 units; CBG 201 - 250: 5 units; CBG 251 - 300: 8 units;CBG 301 - 350: 11 units; CBG 351 - 400: 15 units; CBG > 400 : 15 units and notify MD Patient taking differently: Inject 0-15 Units into the skin See admin instructions. Inject 0-15 units before meals per sliding scale: 70-120 : 0 units 121-150 : 2 units 151-200 : 3 units 201-250 : 5  units 251-300 : 8 units 301-350 : 11 units 351-400 : 15 units > 400 : 15 units, notify MD 06/10/20  Yes Rai, Ripudeep K, MD  insulin glargine-yfgn (SEMGLEE, YFGN,) 100 UNIT/ML Pen Inject 35 Units into the skin 2 (two) times daily.   Yes [provider]  losartan (COZAAR) 50 MG tablet Take 75 mg by mouth daily.   Yes [provider]  Menthol, Topical Analgesic, (BIOFREEZE) 4 % GEL Apply 1 application   topically 2 (two) times daily as needed (pain to back (upper and lower),arms and shoulders).   Yes [provider]  metFORMIN (GLUCOPHAGE) 500 MG tablet Take 1,000 mg by mouth 2 (two) times daily with a meal.   Yes [provider]  nystatin powder Apply 1 Application topically daily. Apply to peri-wound bed, sacrum and right ischium daily with routine dressing.   Yes [provider]  omeprazole (PRILOSEC) 40 MG capsule Take 1 capsule (40 mg total) by mouth 2 (two) times daily. 30 minutes before breakfast/dinner Patient taking differently: Take 40 mg by mouth in the morning and at bedtime. 10/13/22  Yes Mansouraty, Netty Starring., MD  ondansetron (ZOFRAN-ODT) 4 MG disintegrating tablet Take 4 mg by mouth every 8 (eight) hours as needed for nausea or vomiting.   Yes [provider]  piperacillin-tazobactam (ZOSYN) IVPB Inject 3.375 g into the vein every 6 (six) hours. Indication: diabetic foot infection/osteomyelitis  First Dose: Yes Last Day of Therapy: 08/15/2023 Labs - Once weekly:  CBC/D and BMP, Labs - Once weekly: ESR and CRP 07/06/23 08/15/23 Yes Alekh, Kshitiz, MD  Polyethyl Glycol-Propyl Glycol (GENTEAL TEARS SEVERE DAY/NIGHT) 0.4-0.3 % GEL ophthalmic gel Place 1 Application into both eyes 4 (four) times daily as needed (dry eyes).   Yes [provider]  PRESCRIPTION MEDICATION Inject 10 mg into the vein every 6 (six) months. Rituxan 10/ml  Between the 12-13th of June and December   Yes [provider]  senna-docusate (SENOKOT-S) 8.6-50 MG tablet Take 1 tablet by mouth 2 (two) times daily.   Yes [provider]  traZODone (DESYREL) 50 MG tablet Take 1.5 tablets (75 mg total) by mouth at bedtime. 05/31/23  Yes Rhetta Mura, MD  vitamin C (ASCORBIC ACID) 500 MG tablet Take 500 mg by mouth daily.   Yes [provider]  acetic acid 0.25 % irrigation Irrigate with 1 Application as directed daily. Patient not taking:  Reported on 06/30/2023    [provider]  insulin glargine (LANTUS) 100 unit/mL SOPN Inject 35 Units into the skin 2 (two) times daily. Patient not taking: Reported on 06/30/2023 05/31/23   Rhetta Mura, MD  lamoTRIgine (LAMICTAL) 25 MG tablet Take 2 tablets (50 mg total) by mouth 2 (two) times daily. For 5 days. Started 02/18/23. 07/06/23   Glade Lloyd, MD  LORazepam (ATIVAN) 0.5 MG tablet Take 1 tablet (0.5 mg total) by mouth daily as needed (panic attacks). 07/06/23   Glade Lloyd, MD  oxyCODONE (OXY IR/ROXICODONE) 5 MG immediate release tablet Take 1 tablet (5 mg total) by mouth 2 (two) times daily as needed for breakthrough pain. 07/06/23   Glade Lloyd, MD  polyethylene glycol (MIRALAX / GLYCOLAX) 17 g packet Take 17 g by mouth daily as needed for mild constipation. 07/06/23   Glade Lloyd, MD  zinc sulfate 220 (50 Zn) MG capsule Take 1 capsule (220 mg total) by mouth daily. 07/07/23   Glade Lloyd, MD   Allergies  Allergen Reactions   Cipro [Ciprofloxacin Hcl] Other (See  Comments)    Hallucinations Allergy not listed on MAR   Neurontin [Gabapentin] Other (See Comments)    Unknown reaction   Levaquin [Levofloxacin] Rash   Review of Systems No complaints  Physical Exam Resting in bed No distress  Vital Signs: BP (!) 154/65 (BP Location: Left Arm)   Pulse 62   Temp 98.9 F (37.2 C)   Resp 19   Ht 6' (1.829 m)   Wt 98 kg   SpO2 97%   BMI 29.29 kg/m  Pain Scale: 0-10 POSS *See Group Information*: 1-Acceptable,Awake and alert Pain Score: 0-No pain   SpO2: SpO2: 97 % O2 Device:SpO2: 97 % O2 Flow Rate: .O2 Flow Rate (L/min): 2 L/min  IO: Intake/output summary:  Intake/Output Summary (Last 24 hours) at 07/06/2023 1343 Last data filed at 07/06/2023 1100 Gross per 24 hour  Intake 440.09 ml  Output 450 ml  Net -9.91 ml    LBM: Last BM Date : 07/05/23 Baseline Weight: Weight: 98 kg Most recent weight: Weight: 98 kg     Palliative  Assessment/Data:   PPS 60%  Time In:  1320 Time Out:  1420 Time Total:  60  Greater than 50%  of this time was spent counseling and coordinating care related to the above assessment and plan.  Signed by: Rosalin Hawking, MD   Please contact Palliative Medicine Team phone at [phone] for questions and concerns.  For individual provider: See Loretha Stapler

## 2023-07-06 NOTE — Progress Notes (Signed)
Peripherally Inserted Central Catheter Placement  The IV Nurse has discussed with the patient and/or persons authorized to consent for the patient, the purpose of this procedure and the potential benefits and risks involved with this procedure.  The benefits include less needle sticks, lab draws from the catheter, and the patient may be discharged home with the catheter. Risks include, but not limited to, infection, bleeding, blood clot (thrombus formation), and puncture of an artery; nerve damage and irregular heartbeat and possibility to perform a PICC exchange if needed/ordered by physician.  Alternatives to this procedure were also discussed.  Bard Power PICC patient education guide, fact sheet on infection prevention and patient information card has been provided to patient /or left at bedside.  Telephone consent obtained per wife.  PICC Placement Documentation  PICC Single Lumen 07/06/23 Right Cephalic 41 cm 1 cm (Active)  Indication for Insertion or Continuance of Line Prolonged intravenous therapies;Home intravenous therapies (PICC only) 07/06/23 1642  Exposed Catheter (cm) 1 cm 07/06/23 1642  Site Assessment Clean, Dry, Intact 07/06/23 1642  Line Status Flushed;Saline locked;Blood return noted 07/06/23 1642  Dressing Type Transparent;Securing device 07/06/23 1642  Dressing Status Antimicrobial disc in place;Clean, Dry, Intact 07/06/23 1642  Line Care Connections checked and tightened 07/06/23 1642  Line Adjustment (NICU/IV Team Only) No 07/06/23 1642  Dressing Intervention New dressing;Adhesive placed at insertion site (IV team only) 07/06/23 1642  Dressing Change Due 07/13/23 07/06/23 1642       Vernona Rieger  Brent [name] 07/06/2023, 4:44 PM

## 2023-07-06 NOTE — Discharge Summary (Signed)
On: 07/04/2023 15:34   DG Chest 1 View  Result Date: 07/04/2023 CLINICAL DATA:  141880 SOB (shortness of breath) 141880 [id number] Left shoulder pain [id number] Pleuritic pain 40981 EXAM: CHEST  1 VIEW COMPARISON:  CXR 05/27/23 FINDINGS: Cardiomegaly. Likely a small left pleural effusion. There is a hazy opacity at the left lung base could represent atelectasis or infection. No radiographically apparent displaced rib fracture. Visualized upper abdomen unremarkable. IMPRESSION: 1. Hazy opacity at the left lung base could represent atelectasis or infection. 2. Likely a small left pleural effusion. Electronically Signed   By: Lorenza Cambridge M.D.   On: 07/04/2023 14:25   VAS [place] ABI WITH/WO TBI  Result Date: 07/03/2023  LOWER EXTREMITY DOPPLER STUDY Patient Name:  Brent [name].  Date of Exam:   07/03/2023 Medical Rec #: [id number]            Accession #:    [id number] Date of Birth: 05/15/1946           Patient Gender: M Patient Age:   77 years Exam Location:  [facility] Procedure:      VAS [place] ABI WITH/WO TBI Referring Phys: Brendia Sacks --------------------------------------------------------------------------------  Indications: Osteophyte, right foot [[id number]] High Risk Factors: Hypertension, hyperlipidemia, Diabetes.  Vascular Interventions: 07/02/2023 - Right  AMPUTATION TOE FIFTH RAY. Limitations: Today's exam was limited due to an open wound and bandages. Comparison Study: 03/08/2020 - Right: Resting right ankle-brachial index is                   within normal range. No                   evidence of significant right lower extremity arterial                   disease. The right                   toe-brachial index is normal.                    Left: Resting left ankle-brachial index is within normal                   range. No                   evidence of significant left lower extremity arterial disease.                   The left                   toe-brachial index is normal. Performing Technologist: Olen Cordial RVT  Examination Guidelines: A complete evaluation includes at minimum, Doppler waveform signals and systolic blood pressure reading at the level of bilateral brachial, anterior tibial, and posterior tibial arteries, when vessel segments are accessible. Bilateral testing is considered an integral part of a complete examination. Photoelectric Plethysmograph (PPG) waveforms and toe systolic pressure readings are included as required and additional duplex testing as needed. Limited examinations for reoccurring indications may be performed as noted.  ABI Findings: +---------+------------------+-----+----------+--------+ Right    Rt Pressure (mmHg)IndexWaveform  Comment  +---------+------------------+-----+----------+--------+ Brachial 157                    triphasic          +---------+------------------+-----+----------+--------+ PTA      149  Physician Discharge Summary  Brent [name]. WGN:[id number] DOB: October 28, 1945 DOA: 06/30/2023  PCP: Brent Dupes, MD  Admit date: 06/30/2023 Discharge date: 07/06/2023  Admitted From: SNF Disposition: SNF  Recommendations for Outpatient Follow-up:  Follow up with SNF provider at earliest convenience Outpatient follow-up with orthopedics.  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 He was admitted for suspected right foot osteomyelitis as well as further evaluation of sacral decubitus. Imaging confirmed osteomyelitis and patient was seen by orthopedics and underwent right fifth ray amputation.  Subsequently ID was consulted and ID has recommended 6 weeks of IV Zosyn.  PICC line to be placed today.  Patient will be discharged to SNF today after PICC line is placed.  Outpatient follow-up with orthopedics, ID.  Discharge Diagnoses:   Diabetic right foot ulcer Acute osteomyelitis right fifth metatarsal Septic arthritis fifth MTP joint MRI showed fifth MTP joint septic arthritis and osteomyelitis of the fifth metatarsal and fifth proximal phalanx.   S/p 5th ray amputation 10/13 by orthopedics.  Wound care as per orthopedics recommendations. OR cultures growing Providencia/Proteus/E faecalis/Bacteroides. Antibiotics switched to Zosyn from 07/04/2023 onwards.  ID consulted on 07/05/2023:  ID has  recommended 6 weeks of IV Zosyn.  End of treatment date is 08/14/2023.  PICC line to be placed today.  Patient will be discharged to SNF today after PICC line is placed. Follow up as outpatient with orthopedics within 7-10 days for wound check Sutures out in 3-4 weeks in outpatient office   Stage IV decubitus ulcer Ischial decubitus ulcer Chronic in nature.  No evidence on admission for infection.  CT imaging showed stable right sided ischial decubitus ulcer, no fluid collections. Wound care consultation appreciated   Left shoulder pain status post surgery Favor muscular in etiology, probably related to surgery and positioning.  No hypoxia, no tachycardia.  Was on Lovenox pre and postop.  Do not suspect VTE.   Intermittent in nature.  Chest x-ray showed atelectasis.  Left shoulder x-ray negative.  No further evaluation planned at this time.   Lactic acidosis on admission   Leukocytosis -Resolved   Anemia of chronic disease -From chronic illnesses.  Hemoglobin stable.  Monitor intermittently.   Paraplegia secondary to transverse myelitis Neurogenic bladder status post suprapubic catheter   Pyuria Suprapubic catheter Monitor clinically   Repaired gastrocolic fistula Colostomy   DM type 2 CBG remains stable.  Resume prior to hospital regimen.  Carb modified diet.  Hypokalemia Replaced.  Outpatient follow-up  Hypomagnesemia -Replaced.  Outpatient follow-up  Hyponatremia -Mild.  Outpatient follow-up  CAD Hypertension Hyperlipidemia Continue atenolol, atorvastatin   Aortic atherosclerosis Continue statin.   Goals of care -Oral prognosis is guarded to poor.  Palliative care consultation pending  Discharge Instructions  Discharge Instructions     Amb Referral to Palliative Care   Complete by: As directed    Diet - low sodium heart healthy   Complete by: As directed    Diet Carb Modified   Complete by: As directed  Physician Discharge Summary  Brent [name]. [id number]:[id number] DOB: October 28, 1945 DOA: 06/30/2023  PCP: Brent Dupes, MD  Admit date: 06/30/2023 Discharge date: 07/06/2023  Admitted From: SNF Disposition: SNF  Recommendations for Outpatient Follow-up:  Follow up with SNF provider at earliest convenience Outpatient follow-up with orthopedics.  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 He was admitted for suspected right foot osteomyelitis as well as further evaluation of sacral decubitus. Imaging confirmed osteomyelitis and patient was seen by orthopedics and underwent right fifth ray amputation.  Subsequently ID was consulted and ID has recommended 6 weeks of IV Zosyn.  PICC line to be placed today.  Patient will be discharged to SNF today after PICC line is placed.  Outpatient follow-up with orthopedics, ID.  Discharge Diagnoses:   Diabetic right foot ulcer Acute osteomyelitis right fifth metatarsal Septic arthritis fifth MTP joint MRI showed fifth MTP joint septic arthritis and osteomyelitis of the fifth metatarsal and fifth proximal phalanx.   S/p 5th ray amputation 10/13 by orthopedics.  Wound care as per orthopedics recommendations. OR cultures growing Providencia/Proteus/E faecalis/Bacteroides. Antibiotics switched to Zosyn from 07/04/2023 onwards.  ID consulted on 07/05/2023:  ID has  recommended 6 weeks of IV Zosyn.  End of treatment date is 08/14/2023.  PICC line to be placed today.  Patient will be discharged to SNF today after PICC line is placed. Follow up as outpatient with orthopedics within 7-10 days for wound check Sutures out in 3-4 weeks in outpatient office   Stage IV decubitus ulcer Ischial decubitus ulcer Chronic in nature.  No evidence on admission for infection.  CT imaging showed stable right sided ischial decubitus ulcer, no fluid collections. Wound care consultation appreciated   Left shoulder pain status post surgery Favor muscular in etiology, probably related to surgery and positioning.  No hypoxia, no tachycardia.  Was on Lovenox pre and postop.  Do not suspect VTE.   Intermittent in nature.  Chest x-ray showed atelectasis.  Left shoulder x-ray negative.  No further evaluation planned at this time.   Lactic acidosis on admission   Leukocytosis -Resolved   Anemia of chronic disease -From chronic illnesses.  Hemoglobin stable.  Monitor intermittently.   Paraplegia secondary to transverse myelitis Neurogenic bladder status post suprapubic catheter   Pyuria Suprapubic catheter Monitor clinically   Repaired gastrocolic fistula Colostomy   DM type 2 CBG remains stable.  Resume prior to hospital regimen.  Carb modified diet.  Hypokalemia Replaced.  Outpatient follow-up  Hypomagnesemia -Replaced.  Outpatient follow-up  Hyponatremia -Mild.  Outpatient follow-up  CAD Hypertension Hyperlipidemia Continue atenolol, atorvastatin   Aortic atherosclerosis Continue statin.   Goals of care -Oral prognosis is guarded to poor.  Palliative care consultation pending  Discharge Instructions  Discharge Instructions     Amb Referral to Palliative Care   Complete by: As directed    Diet - low sodium heart healthy   Complete by: As directed    Diet Carb Modified   Complete by: As directed  Physician Discharge Summary  Brent [name]. [id number]:[id number] DOB: October 28, 1945 DOA: 06/30/2023  PCP: Brent Dupes, MD  Admit date: 06/30/2023 Discharge date: 07/06/2023  Admitted From: SNF Disposition: SNF  Recommendations for Outpatient Follow-up:  Follow up with SNF provider at earliest convenience Outpatient follow-up with orthopedics.  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 He was admitted for suspected right foot osteomyelitis as well as further evaluation of sacral decubitus. Imaging confirmed osteomyelitis and patient was seen by orthopedics and underwent right fifth ray amputation.  Subsequently ID was consulted and ID has recommended 6 weeks of IV Zosyn.  PICC line to be placed today.  Patient will be discharged to SNF today after PICC line is placed.  Outpatient follow-up with orthopedics, ID.  Discharge Diagnoses:   Diabetic right foot ulcer Acute osteomyelitis right fifth metatarsal Septic arthritis fifth MTP joint MRI showed fifth MTP joint septic arthritis and osteomyelitis of the fifth metatarsal and fifth proximal phalanx.   S/p 5th ray amputation 10/13 by orthopedics.  Wound care as per orthopedics recommendations. OR cultures growing Providencia/Proteus/E faecalis/Bacteroides. Antibiotics switched to Zosyn from 07/04/2023 onwards.  ID consulted on 07/05/2023:  ID has  recommended 6 weeks of IV Zosyn.  End of treatment date is 08/14/2023.  PICC line to be placed today.  Patient will be discharged to SNF today after PICC line is placed. Follow up as outpatient with orthopedics within 7-10 days for wound check Sutures out in 3-4 weeks in outpatient office   Stage IV decubitus ulcer Ischial decubitus ulcer Chronic in nature.  No evidence on admission for infection.  CT imaging showed stable right sided ischial decubitus ulcer, no fluid collections. Wound care consultation appreciated   Left shoulder pain status post surgery Favor muscular in etiology, probably related to surgery and positioning.  No hypoxia, no tachycardia.  Was on Lovenox pre and postop.  Do not suspect VTE.   Intermittent in nature.  Chest x-ray showed atelectasis.  Left shoulder x-ray negative.  No further evaluation planned at this time.   Lactic acidosis on admission   Leukocytosis -Resolved   Anemia of chronic disease -From chronic illnesses.  Hemoglobin stable.  Monitor intermittently.   Paraplegia secondary to transverse myelitis Neurogenic bladder status post suprapubic catheter   Pyuria Suprapubic catheter Monitor clinically   Repaired gastrocolic fistula Colostomy   DM type 2 CBG remains stable.  Resume prior to hospital regimen.  Carb modified diet.  Hypokalemia Replaced.  Outpatient follow-up  Hypomagnesemia -Replaced.  Outpatient follow-up  Hyponatremia -Mild.  Outpatient follow-up  CAD Hypertension Hyperlipidemia Continue atenolol, atorvastatin   Aortic atherosclerosis Continue statin.   Goals of care -Oral prognosis is guarded to poor.  Palliative care consultation pending  Discharge Instructions  Discharge Instructions     Amb Referral to Palliative Care   Complete by: As directed    Diet - low sodium heart healthy   Complete by: As directed    Diet Carb Modified   Complete by: As directed  Physician Discharge Summary  Brent [name]. [id number]:[id number] DOB: October 28, 1945 DOA: 06/30/2023  PCP: Brent Dupes, MD  Admit date: 06/30/2023 Discharge date: 07/06/2023  Admitted From: SNF Disposition: SNF  Recommendations for Outpatient Follow-up:  Follow up with SNF provider at earliest convenience Outpatient follow-up with orthopedics.  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 He was admitted for suspected right foot osteomyelitis as well as further evaluation of sacral decubitus. Imaging confirmed osteomyelitis and patient was seen by orthopedics and underwent right fifth ray amputation.  Subsequently ID was consulted and ID has recommended 6 weeks of IV Zosyn.  PICC line to be placed today.  Patient will be discharged to SNF today after PICC line is placed.  Outpatient follow-up with orthopedics, ID.  Discharge Diagnoses:   Diabetic right foot ulcer Acute osteomyelitis right fifth metatarsal Septic arthritis fifth MTP joint MRI showed fifth MTP joint septic arthritis and osteomyelitis of the fifth metatarsal and fifth proximal phalanx.   S/p 5th ray amputation 10/13 by orthopedics.  Wound care as per orthopedics recommendations. OR cultures growing Providencia/Proteus/E faecalis/Bacteroides. Antibiotics switched to Zosyn from 07/04/2023 onwards.  ID consulted on 07/05/2023:  ID has  recommended 6 weeks of IV Zosyn.  End of treatment date is 08/14/2023.  PICC line to be placed today.  Patient will be discharged to SNF today after PICC line is placed. Follow up as outpatient with orthopedics within 7-10 days for wound check Sutures out in 3-4 weeks in outpatient office   Stage IV decubitus ulcer Ischial decubitus ulcer Chronic in nature.  No evidence on admission for infection.  CT imaging showed stable right sided ischial decubitus ulcer, no fluid collections. Wound care consultation appreciated   Left shoulder pain status post surgery Favor muscular in etiology, probably related to surgery and positioning.  No hypoxia, no tachycardia.  Was on Lovenox pre and postop.  Do not suspect VTE.   Intermittent in nature.  Chest x-ray showed atelectasis.  Left shoulder x-ray negative.  No further evaluation planned at this time.   Lactic acidosis on admission   Leukocytosis -Resolved   Anemia of chronic disease -From chronic illnesses.  Hemoglobin stable.  Monitor intermittently.   Paraplegia secondary to transverse myelitis Neurogenic bladder status post suprapubic catheter   Pyuria Suprapubic catheter Monitor clinically   Repaired gastrocolic fistula Colostomy   DM type 2 CBG remains stable.  Resume prior to hospital regimen.  Carb modified diet.  Hypokalemia Replaced.  Outpatient follow-up  Hypomagnesemia -Replaced.  Outpatient follow-up  Hyponatremia -Mild.  Outpatient follow-up  CAD Hypertension Hyperlipidemia Continue atenolol, atorvastatin   Aortic atherosclerosis Continue statin.   Goals of care -Oral prognosis is guarded to poor.  Palliative care consultation pending  Discharge Instructions  Discharge Instructions     Amb Referral to Palliative Care   Complete by: As directed    Diet - low sodium heart healthy   Complete by: As directed    Diet Carb Modified   Complete by: As directed  On: 07/04/2023 15:34   DG Chest 1 View  Result Date: 07/04/2023 CLINICAL DATA:  141880 SOB (shortness of breath) 141880 242339 Left shoulder pain 242339 92568 Pleuritic pain 40981 EXAM: CHEST  1 VIEW COMPARISON:  CXR 05/27/23 FINDINGS: Cardiomegaly. Likely a small left pleural effusion. There is a hazy opacity at the left lung base could represent atelectasis or infection. No radiographically apparent displaced rib fracture. Visualized upper abdomen unremarkable. IMPRESSION: 1. Hazy opacity at the left lung base could represent atelectasis or infection. 2. Likely a small left pleural effusion. Electronically Signed   By: Lorenza Cambridge M.D.   On: 07/04/2023 14:25   VAS [place] ABI WITH/WO TBI  Result Date: 07/03/2023  LOWER EXTREMITY DOPPLER STUDY Patient Name:  Brent [name].  Date of Exam:   07/03/2023 Medical Rec #: [id number]            Accession #:    [id number] Date of Birth: 05/15/1946           Patient Gender: M Patient Age:   77 years Exam Location:  [facility] Procedure:      VAS [place] ABI WITH/WO TBI Referring Phys: Brendia Sacks --------------------------------------------------------------------------------  Indications: Osteophyte, right foot [[id number]] High Risk Factors: Hypertension, hyperlipidemia, Diabetes.  Vascular Interventions: 07/02/2023 - Right  AMPUTATION TOE FIFTH RAY. Limitations: Today's exam was limited due to an open wound and bandages. Comparison Study: 03/08/2020 - Right: Resting right ankle-brachial index is                   within normal range. No                   evidence of significant right lower extremity arterial                   disease. The right                   toe-brachial index is normal.                    Left: Resting left ankle-brachial index is within normal                   range. No                   evidence of significant left lower extremity arterial disease.                   The left                   toe-brachial index is normal. Performing Technologist: Olen Cordial RVT  Examination Guidelines: A complete evaluation includes at minimum, Doppler waveform signals and systolic blood pressure reading at the level of bilateral brachial, anterior tibial, and posterior tibial arteries, when vessel segments are accessible. Bilateral testing is considered an integral part of a complete examination. Photoelectric Plethysmograph (PPG) waveforms and toe systolic pressure readings are included as required and additional duplex testing as needed. Limited examinations for reoccurring indications may be performed as noted.  ABI Findings: +---------+------------------+-----+----------+--------+ Right    Rt Pressure (mmHg)IndexWaveform  Comment  +---------+------------------+-----+----------+--------+ Brachial 157                    triphasic          +---------+------------------+-----+----------+--------+ PTA      149  On: 07/04/2023 15:34   DG Chest 1 View  Result Date: 07/04/2023 CLINICAL DATA:  141880 SOB (shortness of breath) 141880 242339 Left shoulder pain 242339 92568 Pleuritic pain 40981 EXAM: CHEST  1 VIEW COMPARISON:  CXR 05/27/23 FINDINGS: Cardiomegaly. Likely a small left pleural effusion. There is a hazy opacity at the left lung base could represent atelectasis or infection. No radiographically apparent displaced rib fracture. Visualized upper abdomen unremarkable. IMPRESSION: 1. Hazy opacity at the left lung base could represent atelectasis or infection. 2. Likely a small left pleural effusion. Electronically Signed   By: Lorenza Cambridge M.D.   On: 07/04/2023 14:25   VAS [place] ABI WITH/WO TBI  Result Date: 07/03/2023  LOWER EXTREMITY DOPPLER STUDY Patient Name:  Brent [name].  Date of Exam:   07/03/2023 Medical Rec #: [id number]            Accession #:    [id number] Date of Birth: 05/15/1946           Patient Gender: M Patient Age:   77 years Exam Location:  [facility] Procedure:      VAS [place] ABI WITH/WO TBI Referring Phys: Brendia Sacks --------------------------------------------------------------------------------  Indications: Osteophyte, right foot [[id number]] High Risk Factors: Hypertension, hyperlipidemia, Diabetes.  Vascular Interventions: 07/02/2023 - Right  AMPUTATION TOE FIFTH RAY. Limitations: Today's exam was limited due to an open wound and bandages. Comparison Study: 03/08/2020 - Right: Resting right ankle-brachial index is                   within normal range. No                   evidence of significant right lower extremity arterial                   disease. The right                   toe-brachial index is normal.                    Left: Resting left ankle-brachial index is within normal                   range. No                   evidence of significant left lower extremity arterial disease.                   The left                   toe-brachial index is normal. Performing Technologist: Olen Cordial RVT  Examination Guidelines: A complete evaluation includes at minimum, Doppler waveform signals and systolic blood pressure reading at the level of bilateral brachial, anterior tibial, and posterior tibial arteries, when vessel segments are accessible. Bilateral testing is considered an integral part of a complete examination. Photoelectric Plethysmograph (PPG) waveforms and toe systolic pressure readings are included as required and additional duplex testing as needed. Limited examinations for reoccurring indications may be performed as noted.  ABI Findings: +---------+------------------+-----+----------+--------+ Right    Rt Pressure (mmHg)IndexWaveform  Comment  +---------+------------------+-----+----------+--------+ Brachial 157                    triphasic          +---------+------------------+-----+----------+--------+ PTA      149  Physician Discharge Summary  Brent [name]. WGN:[id number] DOB: October 28, 1945 DOA: 06/30/2023  PCP: Brent Dupes, MD  Admit date: 06/30/2023 Discharge date: 07/06/2023  Admitted From: SNF Disposition: SNF  Recommendations for Outpatient Follow-up:  Follow up with SNF provider at earliest convenience Outpatient follow-up with orthopedics.  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 He was admitted for suspected right foot osteomyelitis as well as further evaluation of sacral decubitus. Imaging confirmed osteomyelitis and patient was seen by orthopedics and underwent right fifth ray amputation.  Subsequently ID was consulted and ID has recommended 6 weeks of IV Zosyn.  PICC line to be placed today.  Patient will be discharged to SNF today after PICC line is placed.  Outpatient follow-up with orthopedics, ID.  Discharge Diagnoses:   Diabetic right foot ulcer Acute osteomyelitis right fifth metatarsal Septic arthritis fifth MTP joint MRI showed fifth MTP joint septic arthritis and osteomyelitis of the fifth metatarsal and fifth proximal phalanx.   S/p 5th ray amputation 10/13 by orthopedics.  Wound care as per orthopedics recommendations. OR cultures growing Providencia/Proteus/E faecalis/Bacteroides. Antibiotics switched to Zosyn from 07/04/2023 onwards.  ID consulted on 07/05/2023:  ID has  recommended 6 weeks of IV Zosyn.  End of treatment date is 08/14/2023.  PICC line to be placed today.  Patient will be discharged to SNF today after PICC line is placed. Follow up as outpatient with orthopedics within 7-10 days for wound check Sutures out in 3-4 weeks in outpatient office   Stage IV decubitus ulcer Ischial decubitus ulcer Chronic in nature.  No evidence on admission for infection.  CT imaging showed stable right sided ischial decubitus ulcer, no fluid collections. Wound care consultation appreciated   Left shoulder pain status post surgery Favor muscular in etiology, probably related to surgery and positioning.  No hypoxia, no tachycardia.  Was on Lovenox pre and postop.  Do not suspect VTE.   Intermittent in nature.  Chest x-ray showed atelectasis.  Left shoulder x-ray negative.  No further evaluation planned at this time.   Lactic acidosis on admission   Leukocytosis -Resolved   Anemia of chronic disease -From chronic illnesses.  Hemoglobin stable.  Monitor intermittently.   Paraplegia secondary to transverse myelitis Neurogenic bladder status post suprapubic catheter   Pyuria Suprapubic catheter Monitor clinically   Repaired gastrocolic fistula Colostomy   DM type 2 CBG remains stable.  Resume prior to hospital regimen.  Carb modified diet.  Hypokalemia Replaced.  Outpatient follow-up  Hypomagnesemia -Replaced.  Outpatient follow-up  Hyponatremia -Mild.  Outpatient follow-up  CAD Hypertension Hyperlipidemia Continue atenolol, atorvastatin   Aortic atherosclerosis Continue statin.   Goals of care -Oral prognosis is guarded to poor.  Palliative care consultation pending  Discharge Instructions  Discharge Instructions     Amb Referral to Palliative Care   Complete by: As directed    Diet - low sodium heart healthy   Complete by: As directed    Diet Carb Modified   Complete by: As directed  Physician Discharge Summary  Brent [name]. WGN:[id number] DOB: October 28, 1945 DOA: 06/30/2023  PCP: Brent Dupes, MD  Admit date: 06/30/2023 Discharge date: 07/06/2023  Admitted From: SNF Disposition: SNF  Recommendations for Outpatient Follow-up:  Follow up with SNF provider at earliest convenience Outpatient follow-up with orthopedics.  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 He was admitted for suspected right foot osteomyelitis as well as further evaluation of sacral decubitus. Imaging confirmed osteomyelitis and patient was seen by orthopedics and underwent right fifth ray amputation.  Subsequently ID was consulted and ID has recommended 6 weeks of IV Zosyn.  PICC line to be placed today.  Patient will be discharged to SNF today after PICC line is placed.  Outpatient follow-up with orthopedics, ID.  Discharge Diagnoses:   Diabetic right foot ulcer Acute osteomyelitis right fifth metatarsal Septic arthritis fifth MTP joint MRI showed fifth MTP joint septic arthritis and osteomyelitis of the fifth metatarsal and fifth proximal phalanx.   S/p 5th ray amputation 10/13 by orthopedics.  Wound care as per orthopedics recommendations. OR cultures growing Providencia/Proteus/E faecalis/Bacteroides. Antibiotics switched to Zosyn from 07/04/2023 onwards.  ID consulted on 07/05/2023:  ID has  recommended 6 weeks of IV Zosyn.  End of treatment date is 08/14/2023.  PICC line to be placed today.  Patient will be discharged to SNF today after PICC line is placed. Follow up as outpatient with orthopedics within 7-10 days for wound check Sutures out in 3-4 weeks in outpatient office   Stage IV decubitus ulcer Ischial decubitus ulcer Chronic in nature.  No evidence on admission for infection.  CT imaging showed stable right sided ischial decubitus ulcer, no fluid collections. Wound care consultation appreciated   Left shoulder pain status post surgery Favor muscular in etiology, probably related to surgery and positioning.  No hypoxia, no tachycardia.  Was on Lovenox pre and postop.  Do not suspect VTE.   Intermittent in nature.  Chest x-ray showed atelectasis.  Left shoulder x-ray negative.  No further evaluation planned at this time.   Lactic acidosis on admission   Leukocytosis -Resolved   Anemia of chronic disease -From chronic illnesses.  Hemoglobin stable.  Monitor intermittently.   Paraplegia secondary to transverse myelitis Neurogenic bladder status post suprapubic catheter   Pyuria Suprapubic catheter Monitor clinically   Repaired gastrocolic fistula Colostomy   DM type 2 CBG remains stable.  Resume prior to hospital regimen.  Carb modified diet.  Hypokalemia Replaced.  Outpatient follow-up  Hypomagnesemia -Replaced.  Outpatient follow-up  Hyponatremia -Mild.  Outpatient follow-up  CAD Hypertension Hyperlipidemia Continue atenolol, atorvastatin   Aortic atherosclerosis Continue statin.   Goals of care -Oral prognosis is guarded to poor.  Palliative care consultation pending  Discharge Instructions  Discharge Instructions     Amb Referral to Palliative Care   Complete by: As directed    Diet - low sodium heart healthy   Complete by: As directed    Diet Carb Modified   Complete by: As directed  Physician Discharge Summary  Brent [name]. WGN:[id number] DOB: October 28, 1945 DOA: 06/30/2023  PCP: Brent Dupes, MD  Admit date: 06/30/2023 Discharge date: 07/06/2023  Admitted From: SNF Disposition: SNF  Recommendations for Outpatient Follow-up:  Follow up with SNF provider at earliest convenience Outpatient follow-up with orthopedics.  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 He was admitted for suspected right foot osteomyelitis as well as further evaluation of sacral decubitus. Imaging confirmed osteomyelitis and patient was seen by orthopedics and underwent right fifth ray amputation.  Subsequently ID was consulted and ID has recommended 6 weeks of IV Zosyn.  PICC line to be placed today.  Patient will be discharged to SNF today after PICC line is placed.  Outpatient follow-up with orthopedics, ID.  Discharge Diagnoses:   Diabetic right foot ulcer Acute osteomyelitis right fifth metatarsal Septic arthritis fifth MTP joint MRI showed fifth MTP joint septic arthritis and osteomyelitis of the fifth metatarsal and fifth proximal phalanx.   S/p 5th ray amputation 10/13 by orthopedics.  Wound care as per orthopedics recommendations. OR cultures growing Providencia/Proteus/E faecalis/Bacteroides. Antibiotics switched to Zosyn from 07/04/2023 onwards.  ID consulted on 07/05/2023:  ID has  recommended 6 weeks of IV Zosyn.  End of treatment date is 08/14/2023.  PICC line to be placed today.  Patient will be discharged to SNF today after PICC line is placed. Follow up as outpatient with orthopedics within 7-10 days for wound check Sutures out in 3-4 weeks in outpatient office   Stage IV decubitus ulcer Ischial decubitus ulcer Chronic in nature.  No evidence on admission for infection.  CT imaging showed stable right sided ischial decubitus ulcer, no fluid collections. Wound care consultation appreciated   Left shoulder pain status post surgery Favor muscular in etiology, probably related to surgery and positioning.  No hypoxia, no tachycardia.  Was on Lovenox pre and postop.  Do not suspect VTE.   Intermittent in nature.  Chest x-ray showed atelectasis.  Left shoulder x-ray negative.  No further evaluation planned at this time.   Lactic acidosis on admission   Leukocytosis -Resolved   Anemia of chronic disease -From chronic illnesses.  Hemoglobin stable.  Monitor intermittently.   Paraplegia secondary to transverse myelitis Neurogenic bladder status post suprapubic catheter   Pyuria Suprapubic catheter Monitor clinically   Repaired gastrocolic fistula Colostomy   DM type 2 CBG remains stable.  Resume prior to hospital regimen.  Carb modified diet.  Hypokalemia Replaced.  Outpatient follow-up  Hypomagnesemia -Replaced.  Outpatient follow-up  Hyponatremia -Mild.  Outpatient follow-up  CAD Hypertension Hyperlipidemia Continue atenolol, atorvastatin   Aortic atherosclerosis Continue statin.   Goals of care -Oral prognosis is guarded to poor.  Palliative care consultation pending  Discharge Instructions  Discharge Instructions     Amb Referral to Palliative Care   Complete by: As directed    Diet - low sodium heart healthy   Complete by: As directed    Diet Carb Modified   Complete by: As directed  Physician Discharge Summary  Brent [name]. WGN:[id number] DOB: October 28, 1945 DOA: 06/30/2023  PCP: Brent Dupes, MD  Admit date: 06/30/2023 Discharge date: 07/06/2023  Admitted From: SNF Disposition: SNF  Recommendations for Outpatient Follow-up:  Follow up with SNF provider at earliest convenience Outpatient follow-up with orthopedics.  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 He was admitted for suspected right foot osteomyelitis as well as further evaluation of sacral decubitus. Imaging confirmed osteomyelitis and patient was seen by orthopedics and underwent right fifth ray amputation.  Subsequently ID was consulted and ID has recommended 6 weeks of IV Zosyn.  PICC line to be placed today.  Patient will be discharged to SNF today after PICC line is placed.  Outpatient follow-up with orthopedics, ID.  Discharge Diagnoses:   Diabetic right foot ulcer Acute osteomyelitis right fifth metatarsal Septic arthritis fifth MTP joint MRI showed fifth MTP joint septic arthritis and osteomyelitis of the fifth metatarsal and fifth proximal phalanx.   S/p 5th ray amputation 10/13 by orthopedics.  Wound care as per orthopedics recommendations. OR cultures growing Providencia/Proteus/E faecalis/Bacteroides. Antibiotics switched to Zosyn from 07/04/2023 onwards.  ID consulted on 07/05/2023:  ID has  recommended 6 weeks of IV Zosyn.  End of treatment date is 08/14/2023.  PICC line to be placed today.  Patient will be discharged to SNF today after PICC line is placed. Follow up as outpatient with orthopedics within 7-10 days for wound check Sutures out in 3-4 weeks in outpatient office   Stage IV decubitus ulcer Ischial decubitus ulcer Chronic in nature.  No evidence on admission for infection.  CT imaging showed stable right sided ischial decubitus ulcer, no fluid collections. Wound care consultation appreciated   Left shoulder pain status post surgery Favor muscular in etiology, probably related to surgery and positioning.  No hypoxia, no tachycardia.  Was on Lovenox pre and postop.  Do not suspect VTE.   Intermittent in nature.  Chest x-ray showed atelectasis.  Left shoulder x-ray negative.  No further evaluation planned at this time.   Lactic acidosis on admission   Leukocytosis -Resolved   Anemia of chronic disease -From chronic illnesses.  Hemoglobin stable.  Monitor intermittently.   Paraplegia secondary to transverse myelitis Neurogenic bladder status post suprapubic catheter   Pyuria Suprapubic catheter Monitor clinically   Repaired gastrocolic fistula Colostomy   DM type 2 CBG remains stable.  Resume prior to hospital regimen.  Carb modified diet.  Hypokalemia Replaced.  Outpatient follow-up  Hypomagnesemia -Replaced.  Outpatient follow-up  Hyponatremia -Mild.  Outpatient follow-up  CAD Hypertension Hyperlipidemia Continue atenolol, atorvastatin   Aortic atherosclerosis Continue statin.   Goals of care -Oral prognosis is guarded to poor.  Palliative care consultation pending  Discharge Instructions  Discharge Instructions     Amb Referral to Palliative Care   Complete by: As directed    Diet - low sodium heart healthy   Complete by: As directed    Diet Carb Modified   Complete by: As directed

## 2023-07-06 NOTE — TOC Transition Note (Addendum)
Transition of Care [facility]) - CM/SW Discharge Note   Patient Details  Name: Brent [name]. MRN: [id number] Date of Birth: 11/28/1945  Transition of Care [facility]) CM/SW Contact:  Darleene Cleaver, LCSW Phone Number: 07/06/2023, 12:25 PM   Clinical Narrative:     CSW was informed that patient will be discharging back to [facility] and will need 6 weeks of IV antibiotics per infectious disease MD.  CSW confirmed with Lawerance Cruel at [facility] that they can provide the zosyn IV antibiotics for him.    Patient to be d/c'ed today to [facility] room 401P.  Patient and family agreeable to plans will transport via ems RN to call report to [phone].  Patient's wife made aware that patient is discharging today.  4:35pm CSW was informed that PICC line has been placed.    CSW contacted [facility] to set up EMS transport, [phone].  Final next level of care: Skilled Nursing Facility Barriers to Discharge: Barriers Resolved   Patient Goals and CMS Choice CMS Medicare.gov Compare Post Acute Care list provided to:: Patient Choice offered to / list presented to : Patient  Discharge Placement PASRR number recieved: 07/03/23 PASRR number recieved: 07/03/23            Patient chooses bed at: [facility] Patient to be transferred to facility by: [facility]      Discharge Plan and Services Additional resources added to the After Visit Summary for   In-house Referral: Clinical Social Work Discharge Planning Services: NA Post Acute Care Choice: Resumption of Svcs/PTA Provider, Nursing Home          DME Arranged: N/A DME Agency: NA                  Social Determinants of Health (SDOH) Interventions SDOH Screenings   Food Insecurity: No Food Insecurity (06/30/2023)  Housing: Low Risk  (06/30/2023)  Transportation Needs: No Transportation Needs (06/30/2023)  Utilities: Not At Risk (06/30/2023)  Depression (PHQ2-9): Low Risk  (12/09/2020)  Financial Resource Strain: Low Risk  (03/06/2020)   Physical Activity: Inactive (12/14/2018)  Social Connections: Unknown (12/14/2018)  Stress: No Stress Concern Present (12/14/2018)  Tobacco Use: Medium Risk (06/30/2023)     Readmission Risk Interventions    07/02/2023   12:31 PM  Readmission Risk Prevention Plan  Transportation Screening Complete  Medication Review (RN Care Manager) Complete  PCP or Specialist appointment within 3-5 days of discharge Not Complete  PCP/Specialist Appt Not Complete comments SNF resident  [facility] or Home Care Consult Complete  SW Recovery Care/Counseling Consult Complete  Palliative Care Screening Not Applicable  Skilled Nursing Facility Complete

## 2023-07-06 NOTE — Plan of Care (Signed)
Problem: Education: Goal: Ability to describe self-care measures that may prevent or decrease complications (Diabetes Survival Skills Education) will improve Outcome: Progressing   Problem: Education: Goal: Knowledge of General Education information will improve Description: Including pain rating scale, medication(s)/side effects and non-pharmacologic comfort measures Outcome: Progressing   Problem: Coping: Goal: Level of anxiety will decrease Outcome: Progressing

## 2023-07-06 NOTE — Progress Notes (Signed)
PHARMACY CONSULT NOTE FOR:  OUTPATIENT  PARENTERAL ANTIBIOTIC THERAPY (OPAT)  Indication: Diabetic right foot ulcer/osteomyelitis  Regimen: Zosyn 3.375 gm every 6 hours  End date: November 26th, 2024  IV antibiotic discharge orders are pended. To discharging provider:  please sign these orders via discharge navigator,  Select New Orders & click on the button choice - Manage This Unsigned Work.     Thank you for allowing pharmacy to be a part of this patient's care.  Roslyn Smiling, PharmD PGY1 Pharmacy Resident 07/06/2023 10:47 AM

## 2023-07-06 NOTE — Plan of Care (Signed)
  Problem: Metabolic: Goal: Ability to maintain appropriate glucose levels will improve Outcome: Progressing   Problem: Nutritional: Goal: Maintenance of adequate nutrition will improve Outcome: Adequate for Discharge   Problem: Skin Integrity: Goal: Risk for impaired skin integrity will decrease Outcome: Progressing   Problem: Tissue Perfusion: Goal: Adequacy of tissue perfusion will improve Outcome: Progressing   Problem: Education: Goal: Knowledge of General Education information will improve Description: Including pain rating scale, medication(s)/side effects and non-pharmacologic comfort measures Outcome: Progressing   Problem: Health Behavior/Discharge Planning: Goal: Ability to manage health-related needs will improve Outcome: Progressing   Problem: Clinical Measurements: Goal: Ability to maintain clinical measurements within normal limits will improve Outcome: Progressing Goal: Will remain free from infection Outcome: Progressing Goal: Diagnostic test results will improve Outcome: Progressing Goal: Respiratory complications will improve Outcome: Progressing Goal: Cardiovascular complication will be avoided Outcome: Progressing   Problem: Coping: Goal: Level of anxiety will decrease Outcome: Progressing   Problem: Elimination: Goal: Will not experience complications related to bowel motility Outcome: Completed/Met Goal: Will not experience complications related to urinary retention Outcome: Completed/Met   Problem: Pain Managment: Goal: General experience of comfort will improve Outcome: Progressing   Problem: Safety: Goal: Ability to remain free from injury will improve Outcome: Progressing   Problem: Skin Integrity: Goal: Risk for impaired skin integrity will decrease Outcome: Progressing

## 2023-07-06 NOTE — Progress Notes (Signed)
PROGRESS NOTE    Brent [name].  QVZ:[id number] DOB: [birth year] DOA: 06/30/2023 PCP: [name], MD   Brief Narrative:  77 year old male with history of paraplegia secondary to transverse myelitis, GI fistula, neurogenic bladder status post suprapubic catheter, stage IV decubitus ulcer, colostomy was sent for evaluation of sacral ulcer and right foot wound, on treatment with ceftriaxone as an outpatient. He was admitted for suspected right foot osteomyelitis as well as further evaluation of sacral decubitus. Imaging confirmed osteomyelitis and patient was seen by orthopedics and underwent right fifth ray amputation.   Assessment & Plan:   Diabetic right foot ulcer Acute osteomyelitis right fifth metatarsal Septic arthritis fifth MTP joint MRI showed fifth MTP joint septic arthritis and osteomyelitis of the fifth metatarsal and fifth proximal phalanx.   S/p 5th ray amputation 10/13 by orthopedics.  Wound care as per orthopedics recommendations. OR cultures growing Providencia/Proteus/E faecalis/Bacteroides.  Sensitivities pending.  Antibiotics switched to Zosyn from 07/04/2023 onwards.  ID consulted on 07/05/2023: Follow recommendations Follow up as outpatient with orthopedics within 7-10 days for wound check Sutures out in 3-4 weeks in outpatient office   Stage IV decubitus ulcer Ischial decubitus ulcer Chronic in nature.  No evidence on admission for infection.  CT imaging showed stable right sided ischial decubitus ulcer, no fluid collections. Wound care consultation appreciated   Left shoulder pain status post surgery Favor muscular in etiology, probably related to surgery and positioning.  No hypoxia, no tachycardia.  Was on Lovenox pre and postop.  Do not suspect VTE.   Intermittent in nature.  Chest x-ray showed atelectasis.  Left shoulder x-ray negative.  No further evaluation planned at this time.   Lactic acidosis on admission  Leukocytosis -Resolved  Anemia  of chronic disease -From chronic illnesses.  Hemoglobin stable.  Monitor intermittently.   Paraplegia secondary to transverse myelitis Neurogenic bladder status post suprapubic catheter   Pyuria Suprapubic catheter Monitor clinically   Repaired gastrocolic fistula Colostomy   DM type 2 CBG remains stable.  Continue Semglee, sliding scale insulin.  Hold metformin while hospitalized   Hypokalemia Replace.  Repeat a.m. labs   Hypomagnesemia -Replace.  Repeat a.m. labs  Hyponatremia -Mild.  Monitor  CAD Hypertension Hyperlipidemia Continue atenolol, atorvastatin   Aortic atherosclerosis Continue statin.  Goals of care -Oral prognosis is guarded to poor.  Palliative care consultation pending  DVT prophylaxis: Lovenox  Code Status: Full Family Communication: None at bedside Disposition Plan: Status is: Inpatient Remains inpatient appropriate because: Of severity of illness.  Consultants: Orthopedics.  ID.  Consult palliative care  Procedures: As above  Antimicrobials:  Anti-infectives (From admission, onward)    Start     Dose/Rate Route Frequency Ordered Stop   07/04/23 1215  piperacillin-tazobactam (ZOSYN) IVPB 3.375 g        3.375 g 12.5 mL/hr over 240 Minutes Intravenous Every 8 hours 07/04/23 1121     07/03/23 0600  ceFAZolin (ANCEF) IVPB 2g/100 mL premix        2 g 200 mL/hr over 30 Minutes Intravenous On call to O.R. 07/02/23 1229 07/02/23 1350   07/03/23 0600  vancomycin (VANCOCIN) IVPB 1000 mg/200 mL premix        1,000 mg 200 mL/hr over 60 Minutes Intravenous On call to O.R. 07/02/23 1229 07/02/23 1434   07/02/23 1426  vancomycin (VANCOCIN) powder  Status:  Discontinued          As needed 07/02/23 1426 07/02/23 1634   07/02/23 1235  vancomycin (VANCOCIN)  1-5 GM/200ML-% IVPB       Note to Pharmacy: Myrlene Broker M: cabinet override      07/02/23 1235 07/02/23 1358   07/02/23 1235  ceFAZolin (ANCEF) 2-4 GM/100ML-% IVPB       Note to Pharmacy: Myrlene Broker M: cabinet override      07/02/23 1235 07/02/23 1351   07/02/23 0000  cefTRIAXone (ROCEPHIN) 2 g in sodium chloride 0.9 % 100 mL IVPB  Status:  Discontinued       Placed in "And" Linked Group   2 g 200 mL/hr over 30 Minutes Intravenous Every 24 hours 07/01/23 1325 07/04/23 1121   07/01/23 1415  metroNIDAZOLE (FLAGYL) tablet 500 mg  Status:  Discontinued       Placed in "And" Linked Group   500 mg Oral Every 12 hours 07/01/23 1325 07/04/23 1121   07/01/23 1000  cefTRIAXone (ROCEPHIN) 1 g in sodium chloride 0.9 % 100 mL IVPB  Status:  Discontinued        1 g 200 mL/hr over 30 Minutes Intravenous Every 24 hours 06/30/23 1749 07/01/23 1325   06/30/23 2200  doxycycline (VIBRA-TABS) tablet 100 mg        100 mg Oral 2 times daily 06/30/23 1757 07/01/23 0909   06/30/23 1500  cefTRIAXone (ROCEPHIN) 1 g in sodium chloride 0.9 % 100 mL IVPB        1 g 200 mL/hr over 30 Minutes Intravenous  Once 06/30/23 1456 06/30/23 1546   06/30/23 1500  vancomycin (VANCOREADY) IVPB 1500 mg/300 mL        1,500 mg 150 mL/hr over 120 Minutes Intravenous  Once 06/30/23 1459 06/30/23 1745        Subjective: Patient seen and examined at bedside.  Poor historian.  No agitation, fever, vomiting reported.  Objective: Vitals:   07/05/23 1134 07/05/23 1514 07/05/23 2334 07/06/23 0930  BP:  (!) 154/79 (!) 148/54 (!) 154/65  Pulse: 60 (!) 57 60 62  Resp:  16 19   Temp:  98 F (36.7 C) 98.9 F (37.2 C)   TempSrc:      SpO2:   97%   Weight:      Height:        Intake/Output Summary (Last 24 hours) at 07/06/2023 1025 Last data filed at 07/06/2023 0945 Gross per 24 hour  Intake 400.04 ml  Output 750 ml  Net -349.96 ml   Filed Weights   06/30/23 1206 06/30/23 1212  Weight: 98 kg 98 kg    Examination:  General: Currently on room air.  No distress.  Chronically ill and deconditioned looking. ENT/neck: No thyromegaly.  JVD is not elevated  respiratory: Decreased breath sounds at bases bilaterally  with some crackles; no wheezing  CVS: S1-S2 heard, rate controlled currently Abdominal: Soft, nontender, slightly distended; no organomegaly, bowel sounds are heard Extremities: Bilateral lower extremity edema; no cyanosis  CNS: Awake and alert.  No focal neurologic deficit.  Moves extremities Lymph: No obvious lymphadenopathy Skin: Right foot dressing present.  No ecchymosis psych: Mostly flat affect.  Not agitated currently. musculoskeletal: No obvious joint swelling/deformity    Data Reviewed: I have personally reviewed following labs and imaging studies  CBC: Recent Labs  Lab 06/30/23 1841 07/01/23 0315 07/02/23 1005 07/03/23 0313 07/06/23 0315  WBC 13.2* 9.6 8.1 9.5 6.2  NEUTROABS  --   --   --   --  4.1  HGB 10.0* 8.1* 8.7* 8.7* 8.2*  HCT 32.9* 26.7* 28.0* 28.7* 27.2*  MCV 86.4 87.0 84.6 85.7 85.5  PLT 353 299 249 319 332   Basic Metabolic Panel: Recent Labs  Lab 06/30/23 1243 06/30/23 1841 07/01/23 0315 07/02/23 1005 07/03/23 0313 07/06/23 0315  NA 136  --  134* 136 135 132*  K 4.0  --  3.9 3.7 3.4* 3.3*  CL 102  --  106 106 104 100  CO2 23  --  18* 22 23 24   GLUCOSE 206*  --  207* 132* 173* 223*  BUN 18  --  12 14 13 8   CREATININE 0.71 0.59* 0.41* 0.61 0.65 0.63  CALCIUM 8.5*  --  7.7* 7.9* 8.1* 7.7*  MG  --   --   --   --   --  1.4*   GFR: Estimated Creatinine Clearance: 92.4 mL/min (by C-G formula based on SCr of 0.63 mg/dL). Liver Function Tests: No results for input(s): "AST", "ALT", "ALKPHOS", "BILITOT", "PROT", "ALBUMIN" in the last 168 hours. No results for input(s): "LIPASE", "AMYLASE" in the last 168 hours. No results for input(s): "AMMONIA" in the last 168 hours. Coagulation Profile: Recent Labs  Lab 07/01/23 0315  INR 1.2   Cardiac Enzymes: No results for input(s): "CKTOTAL", "CKMB", "CKMBINDEX", "TROPONINI" in the last 168 hours. BNP (last 3 results) No results for input(s): "PROBNP" in the last 8760 hours. HbA1C: No results for  input(s): "HGBA1C" in the last 72 hours. CBG: Recent Labs  Lab 07/05/23 0729 07/05/23 1227 07/05/23 1557 07/05/23 2336 07/06/23 0800  GLUCAP 153* 202* 210* 188* 142*   Lipid Profile: No results for input(s): "CHOL", "HDL", "LDLCALC", "TRIG", "CHOLHDL", "LDLDIRECT" in the last 72 hours. Thyroid Function Tests: No results for input(s): "TSH", "T4TOTAL", "FREET4", "T3FREE", "THYROIDAB" in the last 72 hours. Anemia Panel: No results for input(s): "VITAMINB12", "FOLATE", "FERRITIN", "TIBC", "IRON", "RETICCTPCT" in the last 72 hours. Sepsis Labs: Recent Labs  Lab 06/30/23 1255 06/30/23 1430 06/30/23 1437 07/01/23 0315  LATICACIDVEN 2.5* 2.2* 2.3* 5.8*    Recent Results (from the past 240 hour(s))  Blood culture (routine x 2)     Status: None   Collection Time: 06/30/23 12:45 PM   Specimen: Site Not Specified; Blood  Result Value Ref Range Status   Specimen Description   Final    SITE NOT SPECIFIED Performed at [facility], [address] [place], [place] [address]    Special Requests   Final    BOTTLES DRAWN AEROBIC AND ANAEROBIC Blood Culture adequate volume Performed at [facility], [address], [place], [place] [address]    Culture   Final    NO GROWTH 5 DAYS Performed at [facility], [address], [place], [place] [address]    Report Status 07/05/2023 FINAL  Final  Blood culture (routine x 2)     Status: None   Collection Time: 06/30/23 12:49 PM   Specimen: BLOOD RIGHT FOREARM  Result Value Ref Range Status   Specimen Description   Final    BLOOD RIGHT FOREARM Performed at [facility], [address], [place], [place] [address]    Special Requests   Final    BOTTLES DRAWN AEROBIC AND ANAEROBIC Blood Culture adequate volume Performed at [facility], [address] [place], [place] [address]    Culture   Final    NO GROWTH 5 DAYS Performed at [facility], [address] [place], [place] [address]    Report Status 07/05/2023 FINAL  Final  Aerobic/Anaerobic Culture w Gram Stain (surgical/deep wound)     Status: None   Collection Time: 07/02/23  2:13 PM   Specimen: Path Tissue  Result Value Ref Range Status   Specimen Description   Final    TISSUE Performed at [facility], [address] [place], [place] [address]    Special Requests   Final    NONE Performed at [facility], [address], [place], [place] [address]    Gram Stain   Final    FEW WBC PRESENT,BOTH PMN AND MONONUCLEAR RARE GRAM POSITIVE COCCI IN PAIRS IN SINGLES    Culture   Final    RARE ENTEROCOCCUS FAECALIS SUSCEPTIBILITIES PERFORMED ON PREVIOUS CULTURE WITHIN THE LAST 5 DAYS. RARE BACTEROIDES FRAGILIS BETA LACTAMASE POSITIVE Performed at [facility], [address], [place], [place] [address]    Report Status 07/06/2023 FINAL  Final  Aerobic/Anaerobic Culture w Gram Stain (surgical/deep wound)     Status: None   Collection Time: 07/02/23  2:19 PM   Specimen: Path Tissue  Result Value Ref Range Status   Specimen Description   Final    TISSUE Performed at [facility], [address] [place], [place] [address]    Special Requests   Final    NONE Performed at [facility], [address], [place], [place] [address]    Gram Stain   Final    RARE WBC PRESENT, PREDOMINANTLY MONONUCLEAR FEW GRAM POSITIVE COCCI IN PAIRS RARE GRAM NEGATIVE RODS Performed at [facility], [address], [place], [place] [address]    Culture   Final    FEW PROTEUS MIRABILIS FEW ENTEROCOCCUS FAECALIS RARE PROVIDENCIA STUARTII MODERATE BACTEROIDES FRAGILIS BETA LACTAMASE POSITIVE VANCOMYCIN RESISTANT ENTEROCOCCUS ISOLATED    Report Status 07/06/2023 FINAL  Final   Organism ID, Bacteria PROTEUS MIRABILIS  Final   Organism ID, Bacteria ENTEROCOCCUS FAECALIS  Final   Organism ID, Bacteria PROVIDENCIA  STUARTII  Final      Susceptibility   Enterococcus faecalis - MIC*    AMPICILLIN <=2 SENSITIVE Sensitive     VANCOMYCIN >=32 RESISTANT Resistant     GENTAMICIN SYNERGY SENSITIVE Sensitive     * FEW ENTEROCOCCUS FAECALIS   Proteus mirabilis - MIC*    AMPICILLIN <=2 SENSITIVE Sensitive     CEFEPIME <=0.12 SENSITIVE Sensitive     CEFTAZIDIME <=1 SENSITIVE Sensitive     CEFTRIAXONE <=0.25 SENSITIVE Sensitive     CIPROFLOXACIN <=0.25 SENSITIVE Sensitive     GENTAMICIN <=1 SENSITIVE Sensitive     IMIPENEM 2 SENSITIVE Sensitive     TRIMETH/SULFA >=320 RESISTANT Resistant     AMPICILLIN/SULBACTAM <=2 SENSITIVE Sensitive     PIP/TAZO <=4 SENSITIVE Sensitive ug/mL    * FEW PROTEUS MIRABILIS   Providencia stuartii - MIC*    AMPICILLIN >=32 RESISTANT Resistant     CEFEPIME <=0.12 SENSITIVE Sensitive     CEFTAZIDIME <=1 SENSITIVE Sensitive     CEFTRIAXONE <=0.25 SENSITIVE Sensitive     CIPROFLOXACIN >=4 RESISTANT Resistant     GENTAMICIN RESISTANT Resistant     IMIPENEM 2 SENSITIVE Sensitive     TRIMETH/SULFA >=320 RESISTANT Resistant     AMPICILLIN/SULBACTAM >=32 RESISTANT Resistant     PIP/TAZO <=4 SENSITIVE Sensitive ug/mL    * RARE PROVIDENCIA STUARTII  Aerobic/Anaerobic Culture w Gram Stain (surgical/deep wound)     Status: None   Collection Time: 07/02/23  2:51 PM   Specimen: PATH Digit amputation;  Tissue  Result Value Ref Range Status   Specimen Description   Final    DIGIT Performed at [facility], [address], [place], [place] [address]    Special Requests   Final    NONE Performed at [facility], [address] [place], [place] [address]    Gram Stain   Final    FEW WBC PRESENT,BOTH PMN AND MONONUCLEAR FEW GRAM POSITIVE COCCI IN PAIRS IN SINGLES    Culture   Final    FEW PROVIDENCIA STUARTII FEW ENTEROCOCCUS FAECALIS FEW PROTEUS MIRABILIS SUSCEPTIBILITIES PERFORMED ON PREVIOUS CULTURE WITHIN THE LAST 5 DAYS. NO ANAEROBES  ISOLATED Performed at [facility], [address], [place], [place] [address]    Report Status 07/06/2023 FINAL  Final         Radiology Studies: DG Shoulder Left Port  Result Date: 07/04/2023 CLINICAL DATA:  Left shoulder pain EXAM: LEFT SHOULDER COMPARISON:  None Available. FINDINGS: There is no evidence of fracture or dislocation. Mild-moderate osteoarthritic changes of the glenohumeral and acromioclavicular joints. Soft tissues are unremarkable. 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 No acute findings. Electronically Signed   By: Duanne Guess D.O.   On: 07/04/2023 15:34   DG Chest 1 View  Result Date: 07/04/2023 CLINICAL DATA:  141880 SOB (shortness of breath) [id number] Left shoulder pain [id number] Pleuritic pain 41324 EXAM: CHEST  1 VIEW COMPARISON:  CXR 05/27/23 FINDINGS: Cardiomegaly. Likely a small left pleural effusion. There is a hazy opacity at the left lung base could represent atelectasis or infection. No radiographically apparent displaced rib fracture. Visualized upper abdomen unremarkable. IMPRESSION: 1. Hazy opacity at the left lung base could represent atelectasis or infection. 2. Likely a small left pleural effusion. Electronically Signed   By: Lorenza Cambridge M.D.   On: 07/04/2023 14:25        Scheduled Meds:  ascorbic acid  500 mg Oral Daily   atenolol  12.5 mg Oral Daily   And   atenolol  25 mg Oral QHS   atorvastatin  40 mg Oral QHS   baclofen  20 mg Oral TID   cholecalciferol  2,000 Units Oral Daily   DULoxetine  30 mg Oral QHS   enoxaparin (LOVENOX) injection  40 mg Subcutaneous Q24H   famotidine  20 mg Oral BID   Gerhardt's butt cream   Topical TID   insulin aspart  0-15 Units Subcutaneous TID WC   insulin aspart  0-5 Units Subcutaneous QHS   insulin glargine-yfgn  35 Units Subcutaneous BID   lamoTRIgine  50 mg Oral BID   pantoprazole  40 mg Oral BID   Ensure Max Protein  11 oz Oral Daily    senna-docusate  1 tablet Oral BID   sodium chloride flush  3 mL Intravenous Q12H   traZODone  75 mg Oral QHS   zinc sulfate  220 mg Oral Daily   Continuous Infusions:  magnesium sulfate bolus IVPB     piperacillin-tazobactam (ZOSYN)  IV 3.375 g (07/06/23 0747)          Glade Lloyd, MD [facility] 07/06/2023, 10:25 AM

## 2023-07-06 NOTE — TOC Progression Note (Addendum)
Transition of Care (TOC) - Progression Note    Patient Details  Name: Brent [name]. MRN: [id number] Date of Birth: [birth year]  Transition of Care [facility]) CM/SW Contact  Darleene Cleaver, [place] Phone Number: 07/06/2023, 12:28 PM  Clinical Narrative:     CSW informed that patient will need 6 weeks of IV antibiotics at [facility].  CSW spoke to [facility] and they can provide antibiotic for patient.  Patient will be getting PICC line today, if patient is medically ready, [facility] can accept today.  TOC awaiting for confirmation of PICC line being inserted, then set up EMS transport to facility.  CSW left message on wife's voice mail notifying her that he is leaving today.  Expected Discharge Plan: Long Term Nursing Home Barriers to Discharge: Barriers Resolved  Expected Discharge Plan and Services In-house Referral: Clinical Social Work Discharge Planning Services: NA Post Acute Care Choice: Resumption of Svcs/PTA Provider, Nursing Home Living arrangements for the past 2 months: Skilled Nursing Facility Expected Discharge Date: 07/06/23               DME Arranged: N/A DME Agency: NA                   Social Determinants of Health (SDOH) Interventions SDOH Screenings   Food Insecurity: No Food Insecurity (06/30/2023)  Housing: Low Risk  (06/30/2023)  Transportation Needs: No Transportation Needs (06/30/2023)  Utilities: Not At Risk (06/30/2023)  Depression (PHQ2-9): Low Risk  (12/09/2020)  Financial Resource Strain: Low Risk  (03/06/2020)  Physical Activity: Inactive (12/14/2018)  Social Connections: Unknown (12/14/2018)  Stress: No Stress Concern Present (12/14/2018)  Tobacco Use: Medium Risk (06/30/2023)    Readmission Risk Interventions    07/02/2023   12:31 PM  Readmission Risk Prevention Plan  Transportation Screening Complete  Medication Review (RN Care Manager) Complete  PCP or Specialist appointment within 3-5 days of discharge Not Complete   PCP/Specialist Appt Not Complete comments SNF resident  [facility] or Home Care Consult Complete  SW Recovery Care/Counseling Consult Complete  Palliative Care Screening Not Applicable  Skilled Nursing Facility Complete

## 2023-07-14 ENCOUNTER — Encounter (HOSPITAL_COMMUNITY): Payer: Self-pay

## 2023-07-14 ENCOUNTER — Emergency Department (HOSPITAL_COMMUNITY): Payer: Medicare Other

## 2023-07-14 ENCOUNTER — Inpatient Hospital Stay (HOSPITAL_COMMUNITY)
Admission: EM | Admit: 2023-07-14 | Discharge: 2023-07-17 | DRG: 291 | Disposition: A | Discharge status: Skilled Nursing Facility | Payer: Medicare Other | Source: Skilled Nursing Facility | Attending: Family Medicine | Admitting: Family Medicine

## 2023-07-14 ENCOUNTER — Other Ambulatory Visit: Payer: Self-pay

## 2023-07-14 DIAGNOSIS — Z85820 Personal history of malignant melanoma of skin: Secondary | ICD-10-CM

## 2023-07-14 DIAGNOSIS — K219 Gastro-esophageal reflux disease without esophagitis: Secondary | ICD-10-CM | POA: Diagnosis present

## 2023-07-14 DIAGNOSIS — Z955 Presence of coronary angioplasty implant and graft: Secondary | ICD-10-CM | POA: Diagnosis not present

## 2023-07-14 DIAGNOSIS — Z801 Family history of malignant neoplasm of trachea, bronchus and lung: Secondary | ICD-10-CM

## 2023-07-14 DIAGNOSIS — Z800 Family history of malignant neoplasm of digestive organs: Secondary | ICD-10-CM

## 2023-07-14 DIAGNOSIS — Z1152 Encounter for screening for COVID-19: Secondary | ICD-10-CM | POA: Diagnosis not present

## 2023-07-14 DIAGNOSIS — I700 Atherosclerosis of aorta: Secondary | ICD-10-CM | POA: Diagnosis present

## 2023-07-14 DIAGNOSIS — Z7984 Long term (current) use of oral hypoglycemic drugs: Secondary | ICD-10-CM

## 2023-07-14 DIAGNOSIS — I10 Essential (primary) hypertension: Secondary | ICD-10-CM | POA: Diagnosis not present

## 2023-07-14 DIAGNOSIS — J811 Chronic pulmonary edema: Secondary | ICD-10-CM | POA: Diagnosis present

## 2023-07-14 DIAGNOSIS — Z794 Long term (current) use of insulin: Secondary | ICD-10-CM | POA: Diagnosis not present

## 2023-07-14 DIAGNOSIS — R0600 Dyspnea, unspecified: Secondary | ICD-10-CM | POA: Diagnosis not present

## 2023-07-14 DIAGNOSIS — J90 Pleural effusion, not elsewhere classified: Secondary | ICD-10-CM | POA: Diagnosis not present

## 2023-07-14 DIAGNOSIS — Z87891 Personal history of nicotine dependence: Secondary | ICD-10-CM

## 2023-07-14 DIAGNOSIS — N319 Neuromuscular dysfunction of bladder, unspecified: Secondary | ICD-10-CM | POA: Diagnosis present

## 2023-07-14 DIAGNOSIS — D649 Anemia, unspecified: Secondary | ICD-10-CM | POA: Diagnosis not present

## 2023-07-14 DIAGNOSIS — E782 Mixed hyperlipidemia: Secondary | ICD-10-CM | POA: Diagnosis not present

## 2023-07-14 DIAGNOSIS — F4321 Adjustment disorder with depressed mood: Secondary | ICD-10-CM | POA: Diagnosis present

## 2023-07-14 DIAGNOSIS — L89154 Pressure ulcer of sacral region, stage 4: Secondary | ICD-10-CM | POA: Diagnosis present

## 2023-07-14 DIAGNOSIS — J45909 Unspecified asthma, uncomplicated: Secondary | ICD-10-CM | POA: Diagnosis present

## 2023-07-14 DIAGNOSIS — G8250 Quadriplegia, unspecified: Secondary | ICD-10-CM | POA: Diagnosis present

## 2023-07-14 DIAGNOSIS — F341 Dysthymic disorder: Secondary | ICD-10-CM | POA: Diagnosis present

## 2023-07-14 DIAGNOSIS — G373 Acute transverse myelitis in demyelinating disease of central nervous system: Secondary | ICD-10-CM | POA: Diagnosis present

## 2023-07-14 DIAGNOSIS — E876 Hypokalemia: Secondary | ICD-10-CM | POA: Diagnosis not present

## 2023-07-14 DIAGNOSIS — G8220 Paraplegia, unspecified: Secondary | ICD-10-CM | POA: Diagnosis present

## 2023-07-14 DIAGNOSIS — N529 Male erectile dysfunction, unspecified: Secondary | ICD-10-CM | POA: Diagnosis present

## 2023-07-14 DIAGNOSIS — I2510 Atherosclerotic heart disease of native coronary artery without angina pectoris: Secondary | ICD-10-CM | POA: Diagnosis present

## 2023-07-14 DIAGNOSIS — E1165 Type 2 diabetes mellitus with hyperglycemia: Secondary | ICD-10-CM

## 2023-07-14 DIAGNOSIS — Z825 Family history of asthma and other chronic lower respiratory diseases: Secondary | ICD-10-CM

## 2023-07-14 DIAGNOSIS — L89314 Pressure ulcer of right buttock, stage 4: Secondary | ICD-10-CM | POA: Diagnosis present

## 2023-07-14 DIAGNOSIS — G4733 Obstructive sleep apnea (adult) (pediatric): Secondary | ICD-10-CM | POA: Diagnosis present

## 2023-07-14 DIAGNOSIS — Z89421 Acquired absence of other right toe(s): Secondary | ICD-10-CM

## 2023-07-14 DIAGNOSIS — Z933 Colostomy status: Secondary | ICD-10-CM | POA: Diagnosis not present

## 2023-07-14 DIAGNOSIS — E781 Pure hyperglyceridemia: Secondary | ICD-10-CM | POA: Diagnosis present

## 2023-07-14 DIAGNOSIS — E785 Hyperlipidemia, unspecified: Secondary | ICD-10-CM | POA: Diagnosis present

## 2023-07-14 DIAGNOSIS — D638 Anemia in other chronic diseases classified elsewhere: Secondary | ICD-10-CM | POA: Diagnosis present

## 2023-07-14 DIAGNOSIS — I5033 Acute on chronic diastolic (congestive) heart failure: Secondary | ICD-10-CM | POA: Diagnosis present

## 2023-07-14 DIAGNOSIS — Z8739 Personal history of other diseases of the musculoskeletal system and connective tissue: Secondary | ICD-10-CM

## 2023-07-14 DIAGNOSIS — I110 Hypertensive heart disease with heart failure: Secondary | ICD-10-CM | POA: Diagnosis present

## 2023-07-14 DIAGNOSIS — Z888 Allergy status to other drugs, medicaments and biological substances status: Secondary | ICD-10-CM

## 2023-07-14 DIAGNOSIS — Z8249 Family history of ischemic heart disease and other diseases of the circulatory system: Secondary | ICD-10-CM

## 2023-07-14 DIAGNOSIS — G4700 Insomnia, unspecified: Secondary | ICD-10-CM | POA: Diagnosis present

## 2023-07-14 DIAGNOSIS — I252 Old myocardial infarction: Secondary | ICD-10-CM

## 2023-07-14 DIAGNOSIS — Z79899 Other long term (current) drug therapy: Secondary | ICD-10-CM

## 2023-07-14 DIAGNOSIS — I5030 Unspecified diastolic (congestive) heart failure: Secondary | ICD-10-CM | POA: Diagnosis present

## 2023-07-14 LAB — CBC
HCT: 31.5 % — ABNORMAL LOW (ref 39.0–52.0)
Hemoglobin: 9.5 g/dL — ABNORMAL LOW (ref 13.0–17.0)
MCH: 25.4 pg — ABNORMAL LOW (ref 26.0–34.0)
MCHC: 30.2 g/dL (ref 30.0–36.0)
MCV: 84.2 fL (ref 80.0–100.0)
Platelets: 359 10*3/uL (ref 150–400)
RBC: 3.74 MIL/uL — ABNORMAL LOW (ref 4.22–5.81)
RDW: 16.9 % — ABNORMAL HIGH (ref 11.5–15.5)
WBC: 8.8 10*3/uL (ref 4.0–10.5)
nRBC: 0 % (ref 0.0–0.2)

## 2023-07-14 LAB — URINALYSIS, W/ REFLEX TO CULTURE (INFECTION SUSPECTED)
Bilirubin Urine: NEGATIVE
Glucose, UA: NEGATIVE mg/dL
Hgb urine dipstick: NEGATIVE
Ketones, ur: 5 mg/dL — AB
Nitrite: NEGATIVE
Protein, ur: 100 mg/dL — AB
Specific Gravity, Urine: 1.021 (ref 1.005–1.030)
pH: 5 (ref 5.0–8.0)

## 2023-07-14 LAB — CBG MONITORING, ED: Glucose-Capillary: 84 mg/dL (ref 70–99)

## 2023-07-14 LAB — COMPREHENSIVE METABOLIC PANEL
ALT: 12 U/L (ref 0–44)
AST: 13 U/L — ABNORMAL LOW (ref 15–41)
Albumin: 2.1 g/dL — ABNORMAL LOW (ref 3.5–5.0)
Alkaline Phosphatase: 64 U/L (ref 38–126)
Anion gap: 10 (ref 5–15)
BUN: 6 mg/dL — ABNORMAL LOW (ref 8–23)
CO2: 27 mmol/L (ref 22–32)
Calcium: 8.7 mg/dL — ABNORMAL LOW (ref 8.9–10.3)
Chloride: 104 mmol/L (ref 98–111)
Creatinine, Ser: 0.46 mg/dL — ABNORMAL LOW (ref 0.61–1.24)
GFR, Estimated: >60 mL/min (ref >60)
Glucose, Bld: 112 mg/dL — ABNORMAL HIGH (ref 70–99)
Potassium: 3.5 mmol/L (ref 3.5–5.1)
Sodium: 141 mmol/L (ref 135–145)
Total Bilirubin: 0.4 mg/dL (ref 0.3–1.2)
Total Protein: 5.6 g/dL — ABNORMAL LOW (ref 6.5–8.1)

## 2023-07-14 LAB — GLUCOSE, CAPILLARY: Glucose-Capillary: 142 mg/dL — ABNORMAL HIGH (ref 70–99)

## 2023-07-14 LAB — BRAIN NATRIURETIC PEPTIDE: B Natriuretic Peptide: 168.3 pg/mL — ABNORMAL HIGH (ref 0.0–100.0)

## 2023-07-14 LAB — TROPONIN I (HIGH SENSITIVITY)
Troponin I (High Sensitivity): 4 ng/L (ref <18)
Troponin I (High Sensitivity): 5 ng/L (ref <18)

## 2023-07-14 LAB — MRSA NEXT GEN BY PCR, NASAL: MRSA by PCR Next Gen: DETECTED — AB

## 2023-07-14 LAB — PROCALCITONIN: Procalcitonin: <0.1 ng/mL

## 2023-07-14 LAB — SARS CORONAVIRUS 2 BY RT PCR: SARS Coronavirus 2 by RT PCR: NEGATIVE

## 2023-07-14 MED ORDER — TRAZODONE HCL 50 MG PO TABS
100.0000 mg | ORAL_TABLET | Freq: Every day | ORAL | Status: DC
  Administered 2023-07-14 – 2023-07-16 (×3): 100 mg via ORAL
  Filled 2023-07-14 (×3): qty 2

## 2023-07-14 MED ORDER — SPIRONOLACTONE 25 MG PO TABS
25.0000 mg | ORAL_TABLET | Freq: Every day | ORAL | Status: DC
  Administered 2023-07-15 – 2023-07-17 (×3): 25 mg via ORAL
  Filled 2023-07-14 (×3): qty 1

## 2023-07-14 MED ORDER — MUPIROCIN 2 % EX OINT
1.0000 | TOPICAL_OINTMENT | Freq: Two times a day (BID) | CUTANEOUS | Status: DC
  Administered 2023-07-14 – 2023-07-17 (×6): 1 via NASAL
  Filled 2023-07-14: qty 22

## 2023-07-14 MED ORDER — ATENOLOL 25 MG PO TABS
12.5000 mg | ORAL_TABLET | Freq: Every day | ORAL | Status: DC
  Administered 2023-07-15 – 2023-07-17 (×2): 12.5 mg via ORAL
  Filled 2023-07-14 (×3): qty 0.5

## 2023-07-14 MED ORDER — PIPERACILLIN-TAZOBACTAM 3.375 G IVPB
3.3750 g | Freq: Three times a day (TID) | INTRAVENOUS | Status: DC
  Administered 2023-07-14 – 2023-07-17 (×8): 3.375 g via INTRAVENOUS
  Filled 2023-07-14 (×12): qty 50

## 2023-07-14 MED ORDER — OXYCODONE HCL 5 MG PO TABS
5.0000 mg | ORAL_TABLET | Freq: Two times a day (BID) | ORAL | Status: DC | PRN
  Administered 2023-07-15 – 2023-07-17 (×3): 5 mg via ORAL
  Filled 2023-07-14 (×3): qty 1

## 2023-07-14 MED ORDER — ACETAMINOPHEN 650 MG RE SUPP: 650.0000 mg | Freq: Four times a day (QID) | RECTAL | Status: DC | PRN

## 2023-07-14 MED ORDER — DULOXETINE HCL 30 MG PO CPEP
30.0000 mg | ORAL_CAPSULE | Freq: Every day | ORAL | Status: DC
  Administered 2023-07-14 – 2023-07-16 (×3): 30 mg via ORAL
  Filled 2023-07-14 (×3): qty 1

## 2023-07-14 MED ORDER — ALBUTEROL SULFATE (2.5 MG/3ML) 0.083% IN NEBU
2.5000 mg | INHALATION_SOLUTION | Freq: Once | RESPIRATORY_TRACT | Status: AC
  Administered 2023-07-14: 2.5 mg via RESPIRATORY_TRACT
  Filled 2023-07-14: qty 3

## 2023-07-14 MED ORDER — ALBUTEROL SULFATE (2.5 MG/3ML) 0.083% IN NEBU: 2.5000 mg | INHALATION_SOLUTION | Freq: Four times a day (QID) | RESPIRATORY_TRACT | Status: DC | PRN

## 2023-07-14 MED ORDER — ATENOLOL 25 MG PO TABS: 12.5000 mg | ORAL_TABLET | ORAL | Status: DC

## 2023-07-14 MED ORDER — MUSCLE RUB 10-15 % EX CREA: 1.0000 | TOPICAL_CREAM | Freq: Two times a day (BID) | CUTANEOUS | Status: DC | PRN

## 2023-07-14 MED ORDER — LAMOTRIGINE 100 MG PO TABS
50.0000 mg | ORAL_TABLET | Freq: Two times a day (BID) | ORAL | Status: DC
  Administered 2023-07-14 – 2023-07-17 (×6): 50 mg via ORAL
  Filled 2023-07-14 (×6): qty 1

## 2023-07-14 MED ORDER — ACETAMINOPHEN 325 MG PO TABS
650.0000 mg | ORAL_TABLET | Freq: Four times a day (QID) | ORAL | Status: DC | PRN
  Filled 2023-07-14: qty 2

## 2023-07-14 MED ORDER — LOSARTAN POTASSIUM 50 MG PO TABS
75.0000 mg | ORAL_TABLET | Freq: Every day | ORAL | Status: DC
  Administered 2023-07-14 – 2023-07-17 (×4): 75 mg via ORAL
  Filled 2023-07-14 (×4): qty 1

## 2023-07-14 MED ORDER — SODIUM CHLORIDE 0.9% FLUSH
3.0000 mL | Freq: Two times a day (BID) | INTRAVENOUS | Status: DC
Start: 2023-07-14 — End: 2023-07-18
  Administered 2023-07-14 – 2023-07-17 (×5): 3 mL via INTRAVENOUS

## 2023-07-14 MED ORDER — LORAZEPAM 0.5 MG PO TABS
0.5000 mg | ORAL_TABLET | Freq: Every day | ORAL | Status: DC | PRN
  Administered 2023-07-15 (×2): 0.5 mg via ORAL
  Filled 2023-07-14 (×2): qty 1

## 2023-07-14 MED ORDER — NYSTATIN 100000 UNIT/GM EX POWD
1.0000 | Freq: Every day | CUTANEOUS | Status: DC
Start: 2023-07-14 — End: 2023-07-18
  Administered 2023-07-15 – 2023-07-17 (×4): 1 via TOPICAL
  Filled 2023-07-14: qty 15

## 2023-07-14 MED ORDER — FUROSEMIDE 10 MG/ML IJ SOLN
40.0000 mg | Freq: Once | INTRAMUSCULAR | Status: AC
  Administered 2023-07-14: 40 mg via INTRAVENOUS
  Filled 2023-07-14: qty 4

## 2023-07-14 MED ORDER — BACLOFEN 10 MG PO TABS
20.0000 mg | ORAL_TABLET | Freq: Three times a day (TID) | ORAL | Status: DC
  Administered 2023-07-14 – 2023-07-17 (×9): 20 mg via ORAL
  Filled 2023-07-14 (×10): qty 2

## 2023-07-14 MED ORDER — INSULIN GLARGINE-YFGN 100 UNIT/ML ~~LOC~~ SOLN
20.0000 [IU] | Freq: Two times a day (BID) | SUBCUTANEOUS | Status: DC
  Administered 2023-07-14 – 2023-07-17 (×6): 20 [IU] via SUBCUTANEOUS
  Filled 2023-07-14 (×10): qty 0.2

## 2023-07-14 MED ORDER — ALBUTEROL SULFATE (2.5 MG/3ML) 0.083% IN NEBU
2.5000 mg | INHALATION_SOLUTION | RESPIRATORY_TRACT | Status: DC | PRN
  Administered 2023-07-15 – 2023-07-16 (×2): 2.5 mg via RESPIRATORY_TRACT
  Filled 2023-07-14 (×2): qty 3

## 2023-07-14 MED ORDER — FAMOTIDINE 20 MG PO TABS
20.0000 mg | ORAL_TABLET | Freq: Two times a day (BID) | ORAL | Status: DC
  Administered 2023-07-14 – 2023-07-17 (×6): 20 mg via ORAL
  Filled 2023-07-14 (×6): qty 1

## 2023-07-14 MED ORDER — CHLORHEXIDINE GLUCONATE CLOTH 2 % EX PADS
6.0000 | MEDICATED_PAD | Freq: Every day | CUTANEOUS | Status: DC
Start: 2023-07-14 — End: 2023-07-18
  Administered 2023-07-14 – 2023-07-17 (×4): 6 via TOPICAL

## 2023-07-14 MED ORDER — POLYVINYL ALCOHOL 1.4 % OP SOLN: 1.0000 [drp] | Freq: Four times a day (QID) | OPHTHALMIC | Status: DC | PRN

## 2023-07-14 MED ORDER — VITAMIN C 500 MG PO TABS
500.0000 mg | ORAL_TABLET | Freq: Every day | ORAL | Status: DC
  Administered 2023-07-15 – 2023-07-17 (×3): 500 mg via ORAL
  Filled 2023-07-14 (×3): qty 1

## 2023-07-14 MED ORDER — GUAIFENESIN ER 600 MG PO TB12
600.0000 mg | ORAL_TABLET | Freq: Two times a day (BID) | ORAL | Status: DC
  Administered 2023-07-14 – 2023-07-17 (×6): 600 mg via ORAL
  Filled 2023-07-14 (×6): qty 1

## 2023-07-14 MED ORDER — POLYETHYLENE GLYCOL 3350 17 G PO PACK: 17.0000 g | PACK | Freq: Every day | ORAL | Status: DC | PRN

## 2023-07-14 MED ORDER — ZINC SULFATE 220 (50 ZN) MG PO CAPS
220.0000 mg | ORAL_CAPSULE | Freq: Every day | ORAL | Status: DC
  Administered 2023-07-15 – 2023-07-17 (×3): 220 mg via ORAL
  Filled 2023-07-14 (×3): qty 1

## 2023-07-14 MED ORDER — ENOXAPARIN SODIUM 40 MG/0.4ML IJ SOSY
40.0000 mg | PREFILLED_SYRINGE | INTRAMUSCULAR | Status: DC
  Administered 2023-07-14 – 2023-07-17 (×4): 40 mg via SUBCUTANEOUS
  Filled 2023-07-14 (×4): qty 0.4

## 2023-07-14 MED ORDER — ATORVASTATIN CALCIUM 40 MG PO TABS
40.0000 mg | ORAL_TABLET | Freq: Every day | ORAL | Status: DC
  Administered 2023-07-14 – 2023-07-16 (×3): 40 mg via ORAL
  Filled 2023-07-14 (×3): qty 1

## 2023-07-14 MED ORDER — PIPERACILLIN-TAZOBACTAM IV (FOR PTA / DISCHARGE USE ONLY): 3.3750 g | Freq: Four times a day (QID) | INTRAVENOUS | Status: DC

## 2023-07-14 MED ORDER — INSULIN ASPART 100 UNIT/ML IJ SOLN
0.0000 [IU] | Freq: Three times a day (TID) | INTRAMUSCULAR | Status: DC
  Administered 2023-07-15: 3 [IU] via SUBCUTANEOUS
  Administered 2023-07-15: 2 [IU] via SUBCUTANEOUS
  Administered 2023-07-15 – 2023-07-16 (×2): 5 [IU] via SUBCUTANEOUS
  Administered 2023-07-16: 2 [IU] via SUBCUTANEOUS
  Administered 2023-07-16 – 2023-07-17 (×4): 5 [IU] via SUBCUTANEOUS

## 2023-07-14 MED ORDER — ATENOLOL 25 MG PO TABS
25.0000 mg | ORAL_TABLET | Freq: Every day | ORAL | Status: DC
  Administered 2023-07-14 – 2023-07-15 (×2): 25 mg via ORAL
  Filled 2023-07-14 (×4): qty 1

## 2023-07-14 NOTE — ED Notes (Signed)
Ostomy is actually on the L lower abd.

## 2023-07-14 NOTE — ED Notes (Signed)
ED TO INPATIENT HANDOFF REPORT  ED Nurse Name and Phone #: Vernona Rieger [phone]  J Name/Age/Gender Brent [name] 77 y.o. male Room/Bed: 004C/004C  Code Status   Code Status: Full Code  Home/SNF/Other Skilled nursing facility Patient oriented to: self, place, time, and situation Is this baseline? Yes   Triage Complete: Triage complete  Chief Complaint Pulmonary edema [J81.1]  Triage Note Per EMS and pt report, the pt has been coughing for two days. Pt states he was coughing so much it made him SOB and made his chest hurt. [facility] ordered a chest x-ray yesterday but it wasn't done. The family wanted him sent to ED for evaluation. Pt is a quadriplegic, has a suprapubic catheter draining clear, yellow urine, and an ostomy on the R abd. Pt has a PICC in his right bicep for IV abx for osteomyelitis in R foot.   Allergies Allergies  Allergen Reactions   Cipro [Ciprofloxacin Hcl] Other (See Comments)    Hallucinations Allergy not listed on MAR   Neurontin [Gabapentin] Other (See Comments)    Unknown reaction   Levaquin [Levofloxacin] Rash    Level of Care/Admitting Diagnosis ED Disposition     ED Disposition  Admit   Condition  --   Comment  Hospital Area: [facility] [[id number]]  Level of Care: Telemetry Cardiac [[id number]]  May admit patient to [facility] or [facility] if equivalent level of care is available:: No  Covid Evaluation: Asymptomatic - no recent exposure (last 10 days) testing not required  Diagnosis: Pulmonary edema [227917]  Admitting Physician: Clydie Braun [[id number]]  Attending Physician: Clydie Braun [[id number]]  Certification:: I certify this patient will need inpatient services for at least 2 midnights  Expected Medical Readiness: 07/16/2023          B Medical/Surgery History Past Medical History:  Diagnosis Date   Acute osteomyelitis of metatarsal bone of right foot (HCC) 07/01/2023   Allergy    Aortic atherosclerosis  (HCC) 07/01/2023   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 angioplasty of his RCA in 1990. b. bare metal stent placed in the RCA in 2000 followed by rotational atherectomy shortly after for stent restenosis. c. last cath was in 2012 showing stable moderate diffuse CAD. (70% mid LAD, 80% diagonal, 70% Ramus, 40% mid to distal RCA stent restenosis). d. Low risk nuc in 2015.   Diabetes mellitus    Diverticulosis    Elevated CK    Erectile dysfunction    Hemorrhoids    HTN (hypertension)    Hyperlipidemia    Hypertriglyceridemia    Malignant melanoma of left side of neck (HCC) 10/25/2018   Myocardial infarction (HCC)    Obesity    OSA (obstructive sleep apnea)    Persistent disorder of initiating or maintaining sleep    Personal history of colonic polyps 02/05/2003   Renal lesion 06/20/2016   Septic arthritis of foot (HCC) 07/01/2023   Stage IV decubitus ulcer (HCC) 06/30/2023   Past Surgical History:  Procedure Laterality Date   AMPUTATION TOE Right 07/02/2023   Procedure: AMPUTATION TOE FIFTH RAY;  Surgeon: Netta Cedars, MD;  Location: [facility]S;  Service: Orthopedics;  Laterality: Right;   BIOPSY  03/26/2022   Procedure: BIOPSY;  Surgeon: Iva Boop, MD;  Location: [facility] ENDOSCOPY;  Service: Gastroenterology;;   CHOLECYSTECTOMY     CORONARY ANGIOPLASTY     CORONARY STENT PLACEMENT  stenting of the right coronary artery with followup rotational  atherectomy. (3 stents placed)   ENDOSCOPIC MUCOSAL RESECTION  10/13/2022   Procedure: ENDOSCOPIC MUCOSAL RESECTION;  Surgeon: Meridee Score Netty Starring., MD;  Location: [facility] ENDOSCOPY;  Service: Gastroenterology;;   ESOPHAGOGASTRODUODENOSCOPY (EGD) WITH PROPOFOL N/A 03/26/2022   Procedure: ESOPHAGOGASTRODUODENOSCOPY (EGD) WITH PROPOFOL;  Surgeon: Iva Boop, MD;  Location: [facility] ENDOSCOPY;  Service: Gastroenterology;  Laterality: N/A;   ESOPHAGOGASTRODUODENOSCOPY (EGD) WITH PROPOFOL N/A  07/11/2022   Procedure: ESOPHAGOGASTRODUODENOSCOPY (EGD) WITH PROPOFOL;  Surgeon: Meridee Score Netty Starring., MD;  Location: [facility] ENDOSCOPY;  Service: Gastroenterology;  Laterality: N/A;   ESOPHAGOGASTRODUODENOSCOPY (EGD) WITH PROPOFOL N/A 10/13/2022   Procedure: ESOPHAGOGASTRODUODENOSCOPY (EGD) WITH PROPOFOL;  Surgeon: Meridee Score Netty Starring., MD;  Location: [facility] ENDOSCOPY;  Service: Gastroenterology;  Laterality: N/A;   EUS N/A 10/13/2022   Procedure: UPPER ENDOSCOPIC ULTRASOUND (EUS) RADIAL;  Surgeon: Lemar Lofty., MD;  Location: [facility] ENDOSCOPY;  Service: Gastroenterology;  Laterality: N/A;   FINGER SURGERY     right   FOOT SURGERY     right   HEMOSTASIS CLIP PLACEMENT  07/11/2022   Procedure: HEMOSTASIS CLIP PLACEMENT;  Surgeon: Meridee Score Netty Starring., MD;  Location: [facility] ENDOSCOPY;  Service: Gastroenterology;;  ovesco    HEMOSTASIS CLIP PLACEMENT  10/13/2022   Procedure: HEMOSTASIS CLIP PLACEMENT;  Surgeon: Lemar Lofty., MD;  Location: [facility] ENDOSCOPY;  Service: Gastroenterology;;   HOT HEMOSTASIS N/A 07/11/2022   Procedure: HOT HEMOSTASIS (ARGON PLASMA COAGULATION/BICAP);  Surgeon: Lemar Lofty., MD;  Location: [facility] ENDOSCOPY;  Service: Gastroenterology;  Laterality: N/A;   HOT HEMOSTASIS N/A 10/13/2022   Procedure: HOT HEMOSTASIS (ARGON PLASMA COAGULATION/BICAP);  Surgeon: Lemar Lofty., MD;  Location: [facility] ENDOSCOPY;  Service: Gastroenterology;  Laterality: N/A;   INGUINAL HERNIA REPAIR     right   IR FLUORO PROCEDURE UNLISTED  02/21/2023   IR GASTROSTOMY TUBE REMOVAL  02/10/2022   IR REPLACE G-TUBE SIMPLE WO FLUORO  03/14/2023   IR REPLC GASTRO/COLONIC TUBE PERCUT W/FLUORO  12/22/2022   IR REPLC GASTRO/COLONIC TUBE PERCUT W/FLUORO  01/26/2023   IR REPLC GASTRO/COLONIC TUBE PERCUT W/FLUORO  03/06/2023   IR REPLC GASTRO/COLONIC TUBE PERCUT W/FLUORO  04/27/2023   IRRIGATION AND DEBRIDEMENT ABSCESS N/A 06/04/2020   Procedure: IRRIGATION AND DEBRIDEMENT SACRAL WOUND;   Surgeon: Diamantina Monks, MD;  Location: [facility] OR;  Service: General;  Laterality: N/A;   LAPAROSCOPY N/A 06/04/2020   Procedure: LAPAROSCOPY ASSISTED COLOSTOMY, LAPAROSCOPY GASTROSTOMY;  Surgeon: Diamantina Monks, MD;  Location: [facility] OR;  Service: General;  Laterality: N/A;   LAPAROSCOPY N/A 05/24/2023   Procedure: LAPAROSCOPY DIAGNOSTIC;  Surgeon: Emelia Loron, MD;  Location[facility];  Service: General;  Laterality: N/A;   LAPAROTOMY N/A 05/24/2023   Procedure: WEDGED RESECTION OF STOMACH AND GASTROCUTANEOUS FISTULA;  Surgeon: Emelia Loron, MD;  Location: [facility] OR;  Service: General;  Laterality: N/A;   LYSIS OF ADHESION N/A 06/04/2020   Procedure: LYSIS OF ADHESION;  Surgeon: Diamantina Monks, MD;  Location: [facility] OR;  Service: General;  Laterality: N/A;   LYSIS OF ADHESION N/A 05/24/2023   Procedure: LYSIS OF ADHESION;  Surgeon: Emelia Loron, MD;  Location: [facility] OR;  Service: General;  Laterality: N/A;   melanoma removal     neck   ORTHOPEDIC SURGERY     foot right   POLYPECTOMY  10/13/2022   Procedure: POLYPECTOMY;  Surgeon: Lemar Lofty., MD;  Location: [facility] ENDOSCOPY;  Service: Gastroenterology;;   rotator cuff surg     Bil   STENT  REMOVAL  10/13/2022   Procedure: CLIP REMOVAL;  Surgeon: Meridee Score Netty Starring., MD;  Location: [facility] ENDOSCOPY;  Service: Gastroenterology;;   Sunnie Nielsen LIFTING INJECTION  10/13/2022   Procedure: SUBMUCOSAL LIFTING INJECTION;  Surgeon: Lemar Lofty., MD;  Location: [facility] ENDOSCOPY;  Service: Gastroenterology;;   SUBMUCOSAL TATTOO INJECTION  10/13/2022   Procedure: SUBMUCOSAL TATTOO INJECTION;  Surgeon: Lemar Lofty., MD;  Location: [facility] ENDOSCOPY;  Service: Gastroenterology;;     A IV Location/Drains/Wounds Patient Lines/Drains/Airways Status     Active Line/Drains/Airways     Name Placement date Placement time Site Days   PICC Single Lumen 07/06/23 Right Cephalic 41 cm 1 cm 07/06/23  4098  Cephalic  8   Colostomy LLQ --  --  LLQ   --   Suprapubic Catheter Latex 20 Fr. 02/22/23  1550  Latex  142   Incision - 4 Ports Abdomen 1: Right 2: Umbilicus 3: Left 4: Left;Lateral 05/24/23  1416  -- 51   Pressure Injury 02/22/23 02/22/23  0800  -- 142   Pressure Injury 07/01/23 Buttocks Bilateral Deep Tissue Pressure Injury - Purple or maroon localized area of discolored intact skin or blood-filled blister due to damage of underlying soft tissue from pressure and/or shear. Erythema and purple discolorat 07/01/23  0732  -- 13   Pressure Injury 07/01/23 Ischial tuberosity Posterior;Proximal;Right Stage 3 -  Full thickness tissue loss. Subcutaneous fat may be visible but bone, tendon or muscle are NOT exposed. pink  area with white/yellow center 07/01/23  0732  -- 13   Wound / Incision (Open or Dehisced) 05/21/23 Diabetic ulcer Foot Right black round wound 05/21/23  1701  Foot  54   Wound / Incision (Open or Dehisced) 07/01/23 Other (Comment) Foot Right;Posterior;Lateral Stage II Ulcer 07/01/23  0800  Foot  13            Intake/Output Last 24 hours No intake or output data in the 24 hours ending 07/14/23 1515  Labs/Imaging Results for orders placed or performed during the hospital encounter of 07/14/23 (from the past 48 hour(s))  SARS Coronavirus 2 by RT PCR (hospital order, performed in [facility] hospital lab) *cepheid single result test* Anterior Nasal Swab     Status: None   Collection Time: 07/14/23 12:00 PM   Specimen: Anterior Nasal Swab  Result Value Ref Range   SARS Coronavirus 2 by RT PCR NEGATIVE NEGATIVE    Comment: Performed at [facility], [address] [place], [place] [address]  CBC     Status: Abnormal   Collection Time: 07/14/23 12:10 PM  Result Value Ref Range   WBC 8.8 4.0 - 10.5 K/uL   RBC 3.74 (L) 4.22 - 5.81 MIL/uL   Hemoglobin 9.5 (L) 13.0 - 17.0 g/dL   HCT 78.2 (L) 95.6 - 21.3 %   MCV 84.2 80.0 - 100.0 fL   MCH 25.4 (L) 26.0 - 34.0 pg   MCHC 30.2 30.0 - 36.0 g/dL   RDW 08.6 (H) 57.8 -  15.5 %   Platelets 359 150 - 400 K/uL   nRBC 0.0 0.0 - 0.2 %    Comment: Performed at [facility], [address] [place], [place] [address]  Comprehensive metabolic panel     Status: Abnormal   Collection Time: 07/14/23 12:10 PM  Result Value Ref Range   Sodium 141 135 - 145 mmol/L   Potassium 3.5 3.5 - 5.1 mmol/L   Chloride 104 98 - 111 mmol/L   CO2  27 22 - 32 mmol/L   Glucose, Bld 112 (H) 70 - 99 mg/dL    Comment: Glucose reference range applies only to samples taken after fasting for at least 8 hours.   BUN 6 (L) 8 - 23 mg/dL   Creatinine, Ser 1.47 (L) 0.61 - 1.24 mg/dL   Calcium 8.7 (L) 8.9 - 10.3 mg/dL   Total Protein 5.6 (L) 6.5 - 8.1 g/dL   Albumin 2.1 (L) 3.5 - 5.0 g/dL   AST 13 (L) 15 - 41 U/L   ALT 12 0 - 44 U/L   Alkaline Phosphatase 64 38 - 126 U/L   Total Bilirubin 0.4 0.3 - 1.2 mg/dL   GFR, Estimated >82 >95 mL/min    Comment: (NOTE) Calculated using the CKD-EPI Creatinine Equation (2021)    Anion gap 10 5 - 15    Comment: Performed at [facility], [address], [place], [place] [address]   DG Chest Port 1 View  Result Date: 07/14/2023 CLINICAL DATA:  Cough and dyspnea EXAM: PORTABLE CHEST 1 VIEW COMPARISON:  07/04/2023 x-ray FINDINGS: Film is rotated to the left. Enlarged cardiopericardial silhouette with calcified aorta. There are some vascular congestion. Small pleural effusions with the adjacent opacities. No pneumothorax. Right-sided PICC with the tip along the central SVC. Overlapping cardiac leads. Degenerative changes of the spine. IMPRESSION: Enlarged cardiopericardial silhouette with vascular congestion. Increasing small pleural effusions. Adjacent opacities. Recommend follow-up. Right-sided PICC Electronically Signed   By: Karen Kays M.D.   On: 07/14/2023 13:05    Pending Labs Unresulted Labs (From admission, onward)     Start     Ordered   07/15/23 0500  CBC  Tomorrow morning,   R        07/14/23 1514   07/15/23 0500  Basic  metabolic panel  Tomorrow morning,   R        07/14/23 1514   07/14/23 1515  Procalcitonin  Add-on,   AD       References:    Procalcitonin Lower Respiratory Tract Infection AND Sepsis Procalcitonin Algorithm   07/14/23 1514   07/14/23 1408  Brain natriuretic peptide  Once,   URGENT        07/14/23 1407            Vitals/Pain Today's Vitals   07/14/23 1315 07/14/23 1330 07/14/23 1430 07/14/23 1500  BP: (!) 183/60 (!) 171/58 (!) 182/59 (!) 165/56  Pulse: (!) 47 (!) 57 (!) 57 (!) 55  Resp: (!) 21 16 (!) 26 (!) 21  Temp:      TempSrc:      SpO2: 97% 97% 96% 97%  Weight:      Height:      PainSc:        Isolation Precautions Airborne and Contact precautions  Medications Medications  oxyCODONE (Oxy IR/ROXICODONE) immediate release tablet 5 mg (has no administration in time range)  piperacillin-tazobactam (ZOSYN) IVPB (has no administration in time range)  LORazepam (ATIVAN) tablet 0.5 mg (has no administration in time range)  insulin aspart (novoLOG) injection 0-15 Units (has no administration in time range)  insulin glargine-yfgn (SEMGLEE) injection 20 Units (has no administration in time range)  baclofen (LIORESAL) tablet 20 mg (has no administration in time range)  lamoTRIgine (LAMICTAL) tablet 50 mg (has no administration in time range)  enoxaparin (LOVENOX) injection 40 mg (has no administration in time range)  sodium chloride flush (NS) 0.9 % injection 3 mL (has no administration in time range)  acetaminophen (TYLENOL) tablet 650 mg (has no administration in time range)    Or  acetaminophen (TYLENOL) suppository 650 mg (has no administration in time range)  albuterol (PROVENTIL) (2.5 MG/3ML) 0.083% nebulizer solution 2.5 mg (has no administration in time range)  furosemide (LASIX) injection 40 mg (40 mg Intravenous Given 07/14/23 1424)    Mobility non-ambulatory-quadraplegic     Focused Assessments Pulmonary Assessment Handoff:  Lung sounds: Bilateral Breath  Sounds: Clear, Diminished O2 Device: Room Air      R Recommendations: See Admitting Provider Note  Report given to:   Additional Notes: L ostomy. Suprapubic catheter. Some movement in arms

## 2023-07-14 NOTE — H&P (Signed)
History and Physical    Patient: Brent [name]. ZOX:[id number] DOB: [birth year] DOA: 07/14/2023 DOS: the patient was seen and examined on 07/14/2023 PCP: [name], MD  Patient coming from: [facility] via EMS  Chief Complaint:  Chief Complaint  Patient presents with   Shortness of Breath   Cough   Chest Pain   HPI: [name]. is a 77 y.o. male with medical history significant of quadriparesis and quadriplegia due to transverse myelitis, GI fistula  s/p post wedge resection 9/24, neurogenic bladder s/p suprapubic catheter, stage IV decubitus ulcer, insulin-dependent DM type II, asthma in situ who presents with complaints of increased shortness of breath and cough over the last 2 days.  When he was able to cough up sputum it was clear, but states he has not been able to cough up much of anything recently.  He has been coughing frequently to the point where he is unable to catch his breath.  Associated symptoms of wheezing and chest pain from coughing.  Denies having any significant fever or chills.  Review of records note patient had just recently been hospitalized 10/11-10/17 for evaluation for sacral ulcer and right foot wound which revealed right foot osteomyelitis.  Patient was seen by orthopedics and underwent a right fifth ray amputation.  ID have been consulted and recommended 6 weeks of IV Zosyn in the noted to be 08/14/23 for which PICC line was placed.  Patient was discharged to SNF.   In the emergency department patient was noted to be afebrile with pulse 47-57, respirations 9-26, blood pressure 156/56 to 183/60, and O2 saturation currently maintained on room air.  Labs noted WBC 8.8, hemoglobin 9.5, BUN 6, creatinine 0.46, and albumin 2.1.  COVID-19 screening was negative.  Chest x-ray noted enlarged pericardial silhouette with vascular congestion, increasing small pleural effusions, and adjacent opacities.  Review of Systems: As mentioned in the history  of present illness. All other systems reviewed and are negative. Past Medical History:  Diagnosis Date   Acute osteomyelitis of metatarsal bone of right foot (HCC) 07/01/2023   Allergy    Aortic atherosclerosis (HCC) 07/01/2023   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 angioplasty of his RCA in 1990. b. bare metal stent placed in the RCA in 2000 followed by rotational atherectomy shortly after for stent restenosis. c. last cath was in 2012 showing stable moderate diffuse CAD. (70% mid LAD, 80% diagonal, 70% Ramus, 40% mid to distal RCA stent restenosis). d. Low risk nuc in 2015.   Diabetes mellitus    Diverticulosis    Elevated CK    Erectile dysfunction    Hemorrhoids    HTN (hypertension)    Hyperlipidemia    Hypertriglyceridemia    Malignant melanoma of left side of neck (HCC) 10/25/2018   Myocardial infarction (HCC)    Obesity    OSA (obstructive sleep apnea)    Persistent disorder of initiating or maintaining sleep    Personal history of colonic polyps 02/05/2003   Renal lesion 06/20/2016   Septic arthritis of foot (HCC) 07/01/2023   Stage IV decubitus ulcer (HCC) 06/30/2023   Past Surgical History:  Procedure Laterality Date   AMPUTATION TOE Right 07/02/2023   Procedure: AMPUTATION TOE FIFTH RAY;  Surgeon: Netta Cedars, MD;  Location: [facility];  Service: Orthopedics;  Laterality: Right;   BIOPSY  03/26/2022   Procedure: BIOPSY;  Surgeon: Iva Boop, MD;  Location:  [facility];  Service: Gastroenterology;;   CHOLECYSTECTOMY     CORONARY ANGIOPLASTY     CORONARY STENT PLACEMENT     stenting of the right coronary artery with followup rotational  atherectomy. (3 stents placed)   ENDOSCOPIC MUCOSAL RESECTION  10/13/2022   Procedure: ENDOSCOPIC MUCOSAL RESECTION;  Surgeon: Meridee Score Netty Starring., MD;  Location: [facility];  Service: Gastroenterology;;   ESOPHAGOGASTRODUODENOSCOPY (EGD) WITH PROPOFOL N/A 03/26/2022    Procedure: ESOPHAGOGASTRODUODENOSCOPY (EGD) WITH PROPOFOL;  Surgeon: Iva Boop, MD;  Location: [facility];  Service: Gastroenterology;  Laterality: N/A;   ESOPHAGOGASTRODUODENOSCOPY (EGD) WITH PROPOFOL N/A 07/11/2022   Procedure: ESOPHAGOGASTRODUODENOSCOPY (EGD) WITH PROPOFOL;  Surgeon: Meridee Score Netty Starring., MD;  Location: [facility];  Service: Gastroenterology;  Laterality: N/A;   ESOPHAGOGASTRODUODENOSCOPY (EGD) WITH PROPOFOL N/A 10/13/2022   Procedure: ESOPHAGOGASTRODUODENOSCOPY (EGD) WITH PROPOFOL;  Surgeon: Meridee Score Netty Starring., MD;  Location: [facility];  Service: Gastroenterology;  Laterality: N/A;   EUS N/A 10/13/2022   Procedure: UPPER ENDOSCOPIC ULTRASOUND (EUS) RADIAL;  Surgeon: Lemar Lofty., MD;  Location: [facility];  Service: Gastroenterology;  Laterality: N/A;   FINGER SURGERY     right   FOOT SURGERY     right   HEMOSTASIS CLIP PLACEMENT  07/11/2022   Procedure: HEMOSTASIS CLIP PLACEMENT;  Surgeon: Meridee Score Netty Starring., MD;  Location: [facility];  Service: Gastroenterology;;  ovesco    HEMOSTASIS CLIP PLACEMENT  10/13/2022   Procedure: HEMOSTASIS CLIP PLACEMENT;  Surgeon: Lemar Lofty., MD;  Location: [facility];  Service: Gastroenterology;;   HOT HEMOSTASIS N/A 07/11/2022   Procedure: HOT HEMOSTASIS (ARGON PLASMA COAGULATION/BICAP);  Surgeon: Lemar Lofty., MD;  Location: [facility];  Service: Gastroenterology;  Laterality: N/A;   HOT HEMOSTASIS N/A 10/13/2022   Procedure: HOT HEMOSTASIS (ARGON PLASMA COAGULATION/BICAP);  Surgeon: Lemar Lofty., MD;  Location: [facility];  Service: Gastroenterology;  Laterality: N/A;   INGUINAL HERNIA REPAIR     right   IR FLUORO PROCEDURE UNLISTED  02/21/2023   IR GASTROSTOMY TUBE REMOVAL  02/10/2022   IR REPLACE G-TUBE SIMPLE WO FLUORO  03/14/2023   IR REPLC GASTRO/COLONIC TUBE PERCUT W/FLUORO  12/22/2022   IR REPLC GASTRO/COLONIC TUBE PERCUT W/FLUORO  01/26/2023   IR REPLC  GASTRO/COLONIC TUBE PERCUT W/FLUORO  03/06/2023   IR REPLC GASTRO/COLONIC TUBE PERCUT W/FLUORO  04/27/2023   IRRIGATION AND DEBRIDEMENT ABSCESS N/A 06/04/2020   Procedure: IRRIGATION AND DEBRIDEMENT SACRAL WOUND;  Surgeon: Diamantina Monks, MD;  Location: [facility] OR;  Service: General;  Laterality: N/A;   LAPAROSCOPY N/A 06/04/2020   Procedure: LAPAROSCOPY ASSISTED COLOSTOMY, LAPAROSCOPY GASTROSTOMY;  Surgeon: Diamantina Monks, MD;  Location: [facility] OR;  Service: General;  Laterality: N/A;   LAPAROSCOPY N/A 05/24/2023   Procedure: LAPAROSCOPY DIAGNOSTIC;  Surgeon: Emelia Loron, MD;  Location: [facility] OR;  Service: General;  Laterality: N/A;   LAPAROTOMY N/A 05/24/2023   Procedure: WEDGED RESECTION OF STOMACH AND GASTROCUTANEOUS FISTULA;  Surgeon: Emelia Loron, MD;  Location: [facility] OR;  Service: General;  Laterality: N/A;   LYSIS OF ADHESION N/A 06/04/2020   Procedure: LYSIS OF ADHESION;  Surgeon: Diamantina Monks, MD;  Location: [facility] OR;  Service: General;  Laterality: N/A;   LYSIS OF ADHESION N/A 05/24/2023   Procedure: LYSIS OF ADHESION;  Surgeon: Emelia Loron, MD;  Location: [facility] OR;  Service: General;  Laterality: N/A;   melanoma removal     neck   ORTHOPEDIC SURGERY     foot right   POLYPECTOMY  10/13/2022   Procedure: POLYPECTOMY;  Surgeon: Meridee Score Netty Starring., MD;  Location: [facility];  Service: Gastroenterology;;   rotator cuff surg     Bil   STENT REMOVAL  10/13/2022   Procedure: CLIP REMOVAL;  Surgeon: Lemar Lofty., MD;  Location: [facility];  Service: Gastroenterology;;   Sunnie Nielsen LIFTING INJECTION  10/13/2022   Procedure: SUBMUCOSAL LIFTING INJECTION;  Surgeon: Lemar Lofty., MD;  Location: [facility];  Service: Gastroenterology;;   SUBMUCOSAL TATTOO INJECTION  10/13/2022   Procedure: SUBMUCOSAL TATTOO INJECTION;  Surgeon: Lemar Lofty., MD;  Location: [facility];  Service: Gastroenterology;;   Social History:  reports that he quit smoking about 42  years ago. His smoking use included cigarettes. He started smoking about 72 years ago. He has a 45 pack-year smoking history. He has never used smokeless tobacco. He reports that he does not currently use alcohol after a past usage of about 7.0 - 14.0 standard drinks of alcohol per week. He reports that he does not use drugs.  Allergies  Allergen Reactions   Cipro [Ciprofloxacin Hcl] Other (See Comments)    Hallucinations Allergy not listed on MAR   Neurontin [Gabapentin] Other (See Comments)    Unknown reaction   Levaquin [Levofloxacin] Rash    Family History  Problem Relation Age of Onset   Lung cancer Mother    COPD Mother    Obesity Mother    Liver cancer Father    Lung cancer Father    Heart disease Father    Heart disease Sister    Colon cancer Neg Hx    Stomach cancer Neg Hx    Pancreatic cancer Neg Hx    Esophageal cancer Neg Hx    Inflammatory bowel disease Neg Hx    Liver disease Neg Hx    Rectal cancer Neg Hx     Prior to Admission medications   Medication Sig Start Date End Date Taking? Authorizing Provider  acetaminophen (TYLENOL) 500 MG tablet Take 2 tablets (1,000 mg total) by mouth every 6 (six) hours as needed for mild pain (or Fever >/= 101). 05/31/23  Yes Rhetta Mura, MD  atenolol (TENORMIN) 25 MG tablet Take 12.5-25 mg by mouth See admin instructions. Take 12.5 mg (1/2 tablet) by mouth once daily in the morning and 25 mg (1 tablet) at bedtime.   Yes [provider]  atorvastatin (LIPITOR) 40 MG tablet Take 1 tablet (40 mg total) by mouth at bedtime. 05/31/23  Yes Rhetta Mura, MD  baclofen (LIORESAL) 20 MG tablet Take 1 tablet (20 mg total) by mouth 3 (three) times daily. 06/06/23  Yes Amin, Ankit C, MD  benzonatate (TESSALON) 100 MG capsule Take 100 mg by mouth every 8 (eight) hours. 07/13/23 07/20/23 Yes [provider]  cholecalciferol (VITAMIN D3) 25 MCG (1000 UNIT) tablet Take 2,000 Units by mouth daily.   Yes [provider]  DULoxetine (CYMBALTA) 30 MG capsule Take 1 capsule (30 mg total) by mouth at bedtime. 05/31/23  Yes Rhetta Mura, MD  famotidine (PEPCID) 20 MG tablet Take 20 mg by mouth 2 (two) times daily.   Yes [provider]  guaiFENesin (MUCINEX) 600 MG 12 hr tablet Take 600 mg by mouth 2 (two) times daily. 07/12/23 07/18/23 Yes [provider]  guaifenesin (ROBITUSSIN) 100 MG/5ML syrup Take 200 mg by mouth every 6 (six) hours as needed for cough.   Yes [provider]  insulin aspart (NOVOLOG) 100 UNIT/ML injection Inject 0-15 Units into the skin every 4 (four) hours. Sliding scale  CBG 70 - 120: 0 units: CBG 121 - 150: 2 units; CBG 151 - 200: 3 units; CBG 201 - 250: 5 units; CBG 251 - 300: 8 units;CBG 301 - 350: 11 units; CBG 351 - 400: 15 units; CBG > 400 : 15 units and notify MD Patient taking differently: Inject 0-15 Units into the skin See admin instructions. Inject 0-15 units before meals per sliding scale: 70-120 : 0 units 121-150 : 2 units 151-200 : 3 units 201-250 : 5 units 251-300 : 8 units 301-350 : 11 units 351-400 : 15 units > 400 : 15 units, notify MD 06/10/20  Yes Rai, Ripudeep K, MD  insulin glargine-yfgn (SEMGLEE, YFGN,) 100 UNIT/ML Pen Inject 35 Units into the skin 2 (two) times daily.   Yes [provider]  ipratropium-albuterol (DUONEB) 0.5-2.5 (3) MG/3ML SOLN Take 3 mLs by nebulization in the morning and at bedtime. 07/12/23 07/15/23 Yes [provider]  lamoTRIgine (LAMICTAL) 25 MG tablet Take 2 tablets (50 mg total) by mouth 2 (two) times daily. For 5 days. Started 02/18/23. 07/06/23  Yes Glade Lloyd, MD  LORazepam (ATIVAN) 0.5 MG tablet Take 1 tablet (0.5 mg total) by mouth daily as needed (panic attacks). 07/06/23  Yes Glade Lloyd, MD  losartan (COZAAR) 50 MG tablet Take 75 mg by mouth daily.   Yes [provider]  Menthol, Topical Analgesic, (BIOFREEZE) 4 % GEL Apply 1 application  topically 2 (two)  times daily as needed (pain to back (upper and lower),arms and shoulders).   Yes [provider]  metFORMIN (GLUCOPHAGE) 500 MG tablet Take 1,000 mg by mouth 2 (two) times daily with a meal.   Yes [provider]  nystatin powder Apply 1 Application topically daily. Apply to peri-wound bed, sacrum and right ischium daily with routine dressing.   Yes [provider]  omeprazole (PRILOSEC) 40 MG capsule Take 1 capsule (40 mg total) by mouth 2 (two) times daily. 30 minutes before breakfast/dinner 10/13/22  Yes Mansouraty, Netty Starring., MD  ondansetron (ZOFRAN-ODT) 4 MG disintegrating tablet Take 4 mg by mouth every 8 (eight) hours as needed for nausea or vomiting.   Yes [provider]  oxyCODONE (OXY IR/ROXICODONE) 5 MG immediate release tablet Take 1 tablet (5 mg total) by mouth 2 (two) times daily as needed for breakthrough pain. 07/06/23  Yes Glade Lloyd, MD  piperacillin-tazobactam (ZOSYN) IVPB Inject 3.375 g into the vein every 6 (six) hours. Indication: diabetic foot infection/osteomyelitis  First Dose: Yes Last Day of Therapy: 08/15/2023 Labs - Once weekly:  CBC/D and BMP, Labs - Once weekly: ESR and CRP 07/06/23 08/15/23 Yes Alekh, Kshitiz, MD  Polyethyl Glycol-Propyl Glycol (GENTEAL TEARS SEVERE DAY/NIGHT) 0.4-0.3 % GEL ophthalmic gel Place 1 Application into both eyes 4 (four) times daily as needed (dry eyes).   Yes [provider]  polyethylene glycol (MIRALAX / GLYCOLAX) 17 g packet Take 17 g by mouth daily as needed for mild constipation. 07/06/23  Yes Glade Lloyd, MD  PRESCRIPTION MEDICATION Inject 10 mg into the vein every 6 (six) months. Rituxan 10/ml  Between the 12-13th of June and December   Yes [provider]  senna-docusate (SENOKOT-S) 8.6-50 MG tablet Take 1 tablet by mouth 2 (two) times daily.   Yes [provider]  spironolactone (ALDACTONE) 25 MG tablet Take 25 mg by mouth daily.   Yes [provider]   traZODone (DESYREL) 100 MG tablet Take 100 mg by mouth at bedtime.   Yes [provider]  vitamin C (ASCORBIC ACID) 500 MG tablet Take 500 mg by mouth daily.   Yes [provider]  zinc sulfate 220 (50 Zn) MG capsule Take 1 capsule (220 mg total) by mouth daily. 07/07/23  Yes Glade Lloyd, MD  doxycycline (VIBRAMYCIN) 100 MG capsule Take 100 mg by mouth 2 (two) times daily. Patient not taking: Reported on 07/14/2023 06/21/23   [provider]    Physical Exam: Vitals:   07/14/23 1245 07/14/23 1300 07/14/23 1315 07/14/23 1330  BP: (!) 168/56 (!) 167/65 (!) 183/60 (!) 171/58  Pulse: (!) 50 (!) 57 (!) 47 (!) 57  Resp: (!) 9 20 (!) 21 16  Temp:      TempSrc:      SpO2: 97% 95% 97% 97%  Weight:      Height:       Constitutional: Elderly male currently in some respiratory distress Eyes: PERRL, lids and conjunctivae normal ENMT: Mucous membranes are moist.  Fair dentition. Neck: normal, supple,  JVD Respiratory: Normal respiratory effort with expiratory wheezes appreciated. Cardiovascular: Regular rate and rhythm, no murmurs / rubs / gallops.  2+ pitting bilateral lower extremity edema.   PICC line in the right upper extremity. Abdomen: no tenderness, no masses palpated.  Suprapubic catheter in place.  Colostomy present of the left lower quadrant of the abdomen. Musculoskeletal: no clubbing / cyanosis.  Right fifth toe amputation. Skin: Stage IV sacral ulcer present. Neurologic: CN 2-12 grossly intact.  Paraplegic  Psychiatric: Normal judgment and insight. Alert and oriented x 3.  Data Reviewed:  EKG reveals sinus rhythm at 53 bpm with reviewed labs, imaging, and pertinent records as documented.  Assessment and Plan:  Heart failure with preserved ejection fraction Acute on chronic.  Patient presented with complaints of cough and shortness of breath.  Notes chest pain secondary to coughing.  Chest x-ray had noted enlarged pericardial silhouette with vascular  congestion, increasing small pleural effusions, and adjacent opacities.  BNP was elevated at 168.3.  Last echocardiogram from 2021 noted EF to be 60 to 65% with indeterminate diastolic parameters.  Patient had been given Lasix 40 mg IV.  Lower suspicion for infection at this time as patient has been on antibiotics and white blood cell count within normal limits.. -Admit to a cardiac telemetry bed -Strict intake and output and daily weights -Check procalcitonin and high-sensitivity troponin -Check echocardiogram -Continue Lasix to 40 mg IV twice daily -Continue beta-blocker and spironolactone -Follow-up echocardiogram in formally consult cardiology if significant change in heart function noted.  History of osteomyelitis of the right fifth metatarsal Septic arthritis of fifth MTP joint Patient s/p fifth ray amputation on 10/13 by orthopedics after MRI showed fifth MTP joint septic arthritis and osteomyelitis of the fifth metatarsal and fifth proximal phalanx. OR cultures growing Providencia/Proteus/E faecalis/Bacteroides.  Scheduled to be on 6 weeks of IV Zosyn per ID until 08/14/2023.  Right upper extremity PICC in place. -Continue Zosyn  Diabetes mellitus type 2, with long-term use of insulin On admission glucose noted to be 112.  Last hemoglobin A1c was 7.5 on 06/06/2023.  Home medication regimen includes Semglee -Hypoglycemic protocols -Hold metformin -Pharmacy substitution of Semglee reduced to 20 units twice daily -CBGs before every meal with moderate SSI -Adjust insulin regimen back prior regimen once tolerating p.o.  Essential hypertension On admission blood pressures elevated up to 183/60. -Continue atenolol, losartan, and spironolactone  Anemia of chronic disease Hemoglobin 9.5 g/dL which appears slightly improved from prior hospitalization of 8.2 on 10/17.  Hyperlipidemia -  Continue atorvastatin  Depression -Continue Cymbalta  Stage IV decubitus ulcer Ischial decubitus  ulcer Patient has chronic ulcers.  Prior CT imaging showed stable right-sided ischial decubitus ulcer with no fluid collections from 10/11. -Low-air-loss mattress replacement -Continue prior wound care orders as seen below Dressing procedure/placement/frequency: Cleanse entire sacrum/buttocks with Vashe [name] [phone]) wound cleanser, apply Vashe moistened gauze to open areas sacrum, buttocks and R ischium. Cover R ischial wound with dry gauze and silicone foam.  Coat surrounding skin of buttocks with Gerhardt's butt cream 3 times daily and as needed for soiling.  May cover with ABD pad (would not use silicone foam to sacrum/buttocks as this will hold moisture onto skin).  Paraplegia secondary to transverse myelitis Neurogenic bladder s/p suprapubic catheter -Continue baclofen   History of gastrocolic fistula S/p colostomy -Colostomy care  GERD -Continue PPI.  Note it appears patient is on tube but only 1 was continued   DVT prophylaxis: Lovenox Advance Care Planning:   Code Status: Full Code    Consults: none  Family Communication:   Severity of Illness: The appropriate patient status for this patient is INPATIENT. Inpatient status is judged to be reasonable and necessary in order to provide the required intensity of service to ensure the patient's safety. The patient's presenting symptoms, physical exam findings, and initial radiographic and laboratory data in the context of their chronic comorbidities is felt to place them at high risk for further clinical deterioration. Furthermore, it is not anticipated that the patient will be medically stable for discharge from the hospital within 2 midnights of admission.   * I certify that at the point of admission it is my clinical judgment that the patient will require inpatient hospital care spanning beyond 2 midnights from the point of admission due to high intensity of service, high risk for further deterioration and high frequency of  surveillance required.*  Author: Clydie Braun, MD 07/14/2023 2:51 PM  For on call review [email or web address].

## 2023-07-14 NOTE — ED Notes (Signed)
Please update wife

## 2023-07-14 NOTE — ED Triage Notes (Signed)
Per EMS and pt report, the pt has been coughing for two days. Pt states he was coughing so much it made him SOB and made his chest hurt. [facility] ordered a chest x-ray yesterday but it wasn't done. The family wanted him sent to ED for evaluation. Pt is a quadriplegic, has a suprapubic catheter draining clear, yellow urine, and an ostomy on the R abd. Pt has a PICC in his right bicep for IV abx for osteomyelitis in R foot.

## 2023-07-14 NOTE — ED Provider Notes (Signed)
[facility] Provider Note   CSN: [id number] Arrival date & time: 07/14/23  1047     History  Chief Complaint  Patient presents with   Shortness of Breath   Cough   Chest Pain    Brent [name]. is a 77 y.o. male.  HPI 77 year old male history of recent admission for diabetic foot ulcer with CT of myelitis and septic arthritis of fifth MTP joint, status postresection on 1013, type 2 diabetes, quadriplegia secondary to transverse myelitis, suprapubic catheter, ostomy, history of myelitis of right foot, MI, presents today with dyspnea for 2 days.  He reports cough but no definite sputum.  He denies fever or chills.  He reports reports pain from coughing.     Home Medications Prior to Admission medications   Medication Sig Start Date End Date Taking? Authorizing Provider  acetaminophen (TYLENOL) 500 MG tablet Take 2 tablets (1,000 mg total) by mouth every 6 (six) hours as needed for mild pain (or Fever >/= 101). 05/31/23  Yes Rhetta Mura, MD  atenolol (TENORMIN) 25 MG tablet Take 12.5-25 mg by mouth See admin instructions. Take 12.5 mg (1/2 tablet) by mouth once daily in the morning and 25 mg (1 tablet) at bedtime.   Yes [provider]  atorvastatin (LIPITOR) 40 MG tablet Take 1 tablet (40 mg total) by mouth at bedtime. 05/31/23  Yes Rhetta Mura, MD  baclofen (LIORESAL) 20 MG tablet Take 1 tablet (20 mg total) by mouth 3 (three) times daily. 06/06/23  Yes Amin, Ankit C, MD  benzonatate (TESSALON) 100 MG capsule Take 100 mg by mouth every 8 (eight) hours. 07/13/23 07/20/23 Yes [provider]  cholecalciferol (VITAMIN D3) 25 MCG (1000 UNIT) tablet Take 2,000 Units by mouth daily.   Yes [provider]  DULoxetine (CYMBALTA) 30 MG capsule Take 1 capsule (30 mg total) by mouth at bedtime. 05/31/23  Yes Rhetta Mura, MD  famotidine (PEPCID) 20 MG tablet Take 20 mg by mouth 2 (two) times  daily.   Yes [provider]  guaiFENesin (MUCINEX) 600 MG 12 hr tablet Take 600 mg by mouth 2 (two) times daily. 07/12/23 07/18/23 Yes [provider]  guaifenesin (ROBITUSSIN) 100 MG/5ML syrup Take 200 mg by mouth every 6 (six) hours as needed for cough.   Yes [provider]  insulin aspart (NOVOLOG) 100 UNIT/ML injection Inject 0-15 Units into the skin every 4 (four) hours. Sliding scale  CBG 70 - 120: 0 units: CBG 121 - 150: 2 units; CBG 151 - 200: 3 units; CBG 201 - 250: 5 units; CBG 251 - 300: 8 units;CBG 301 - 350: 11 units; CBG 351 - 400: 15 units; CBG > 400 : 15 units and notify MD Patient taking differently: Inject 0-15 Units into the skin See admin instructions. Inject 0-15 units before meals per sliding scale: 70-120 : 0 units 121-150 : 2 units 151-200 : 3 units 201-250 : 5 units 251-300 : 8 units 301-350 : 11 units 351-400 : 15 units > 400 : 15 units, notify MD 06/10/20  Yes Rai, Ripudeep K, MD  insulin glargine-yfgn (SEMGLEE, YFGN,) 100 UNIT/ML Pen Inject 35 Units into the skin 2 (two) times daily.   Yes [provider]  ipratropium-albuterol (DUONEB) 0.5-2.5 (3) MG/3ML SOLN Take 3 mLs by nebulization in the morning and at bedtime. 07/12/23 07/15/23 Yes [provider]  lamoTRIgine (LAMICTAL) 25 MG tablet Take 2 tablets (50 mg total) by mouth 2 (two)  times daily. For 5 days. Started 02/18/23. 07/06/23  Yes Glade Lloyd, MD  LORazepam (ATIVAN) 0.5 MG tablet Take 1 tablet (0.5 mg total) by mouth daily as needed (panic attacks). 07/06/23  Yes Glade Lloyd, MD  losartan (COZAAR) 50 MG tablet Take 75 mg by mouth daily.   Yes [provider]  Menthol, Topical Analgesic, (BIOFREEZE) 4 % GEL Apply 1 application  topically 2 (two) times daily as needed (pain to back (upper and lower),arms and shoulders).   Yes [provider]  metFORMIN (GLUCOPHAGE) 500 MG tablet Take 1,000 mg by mouth 2 (two) times daily with a meal.   Yes  [provider]  nystatin powder Apply 1 Application topically daily. Apply to peri-wound bed, sacrum and right ischium daily with routine dressing.   Yes [provider]  omeprazole (PRILOSEC) 40 MG capsule Take 1 capsule (40 mg total) by mouth 2 (two) times daily. 30 minutes before breakfast/dinner 10/13/22  Yes Mansouraty, Netty Starring., MD  ondansetron (ZOFRAN-ODT) 4 MG disintegrating tablet Take 4 mg by mouth every 8 (eight) hours as needed for nausea or vomiting.   Yes [provider]  oxyCODONE (OXY IR/ROXICODONE) 5 MG immediate release tablet Take 1 tablet (5 mg total) by mouth 2 (two) times daily as needed for breakthrough pain. 07/06/23  Yes Glade Lloyd, MD  piperacillin-tazobactam (ZOSYN) IVPB Inject 3.375 g into the vein every 6 (six) hours. Indication: diabetic foot infection/osteomyelitis  First Dose: Yes Last Day of Therapy: 08/15/2023 Labs - Once weekly:  CBC/D and BMP, Labs - Once weekly: ESR and CRP 07/06/23 08/15/23 Yes Alekh, Kshitiz, MD  Polyethyl Glycol-Propyl Glycol (GENTEAL TEARS SEVERE DAY/NIGHT) 0.4-0.3 % GEL ophthalmic gel Place 1 Application into both eyes 4 (four) times daily as needed (dry eyes).   Yes [provider]  polyethylene glycol (MIRALAX / GLYCOLAX) 17 g packet Take 17 g by mouth daily as needed for mild constipation. 07/06/23  Yes Glade Lloyd, MD  PRESCRIPTION MEDICATION Inject 10 mg into the vein every 6 (six) months. Rituxan 10/ml  Between the 12-13th of June and December   Yes [provider]  senna-docusate (SENOKOT-S) 8.6-50 MG tablet Take 1 tablet by mouth 2 (two) times daily.   Yes [provider]  spironolactone (ALDACTONE) 25 MG tablet Take 25 mg by mouth daily.   Yes [provider]  traZODone (DESYREL) 100 MG tablet Take 100 mg by mouth at bedtime.   Yes [provider]  vitamin C (ASCORBIC ACID) 500 MG tablet Take 500 mg by mouth daily.   Yes [provider]  zinc  sulfate 220 (50 Zn) MG capsule Take 1 capsule (220 mg total) by mouth daily. 07/07/23  Yes Glade Lloyd, MD  doxycycline (VIBRAMYCIN) 100 MG capsule Take 100 mg by mouth 2 (two) times daily. Patient not taking: Reported on 07/14/2023 06/21/23   [provider]      Allergies    Cipro [ciprofloxacin hcl], Neurontin [gabapentin], and Levaquin [levofloxacin]    Review of Systems   Review of Systems  Physical Exam Updated Vital Signs BP (!) 171/58   Pulse (!) 57   Temp 97.6 F (36.4 C) (Oral)   Resp 16   Ht 1.829 m (6')   Wt 98 kg   SpO2 97%   BMI 29.29 kg/m  Physical Exam Vitals and nursing note reviewed.  Constitutional:      Appearance: He is well-developed.  HENT:     Head: Normocephalic.     Mouth/Throat:  Mouth: Mucous membranes are moist.  Eyes:     Pupils: Pupils are equal, round, and reactive to light.  Cardiovascular:     Rate and Rhythm: Normal rate and regular rhythm.  Pulmonary:     Effort: Pulmonary effort is normal.     Breath sounds: Examination of the right-lower field reveals decreased breath sounds. Examination of the left-lower field reveals decreased breath sounds. Decreased breath sounds present.  Chest:     Chest wall: No mass.  Abdominal:     Palpations: Abdomen is soft.  Musculoskeletal:     Cervical back: Normal range of motion.     Comments: Patient with healing wound right foot Firm edema throughout lower extremities and lower abdomen  Skin:    Capillary Refill: Capillary refill takes less than 2 seconds.     Comments: Sacral decubitus ulcer  Neurological:     General: No focal deficit present.     Mental Status: He is alert.     ED Results / Procedures / Treatments   Labs (all labs ordered are listed, but only abnormal results are displayed) Labs Reviewed  CBC - Abnormal; Notable for the following components:      Result Value   RBC 3.74 (*)    Hemoglobin 9.5 (*)    HCT 31.5 (*)    MCH 25.4 (*)    RDW 16.9 (*)     All other components within normal limits  COMPREHENSIVE METABOLIC PANEL - Abnormal; Notable for the following components:   Glucose, Bld 112 (*)    BUN 6 (*)    Creatinine, Ser 0.46 (*)    Calcium 8.7 (*)    Total Protein 5.6 (*)    Albumin 2.1 (*)    AST 13 (*)    All other components within normal limits  SARS CORONAVIRUS 2 BY RT PCR  BRAIN NATRIURETIC PEPTIDE    EKG EKG Interpretation Date/Time:  Friday July 14 2023 11:00:05 EDT Ventricular Rate:  53 PR Interval:  199 QRS Duration:  108 QT Interval:  511 QTC Calculation: 480 R Axis:   54  Text Interpretation: Sinus rhythm Low voltage, precordial leads Nonspecific T abnormalities, lateral leads Minimal ST elevation, inferior leads Borderline prolonged QT interval Confirmed by Margarita Grizzle [phone]) on 07/14/2023 2:05:53 PM  Radiology DG Chest Port 1 View  Result Date: 07/14/2023 CLINICAL DATA:  Cough and dyspnea EXAM: PORTABLE CHEST 1 VIEW COMPARISON:  07/04/2023 x-Otha Rickles FINDINGS: Film is rotated to the left. Enlarged cardiopericardial silhouette with calcified aorta. There are some vascular congestion. Small pleural effusions with the adjacent opacities. No pneumothorax. Right-sided PICC with the tip along the central SVC. Overlapping cardiac leads. Degenerative changes of the spine. IMPRESSION: Enlarged cardiopericardial silhouette with vascular congestion. Increasing small pleural effusions. Adjacent opacities. Recommend follow-up. Right-sided PICC Electronically Signed   By: Karen Kays M.D.   On: 07/14/2023 13:05    Procedures .Critical Care  Performed by: Margarita Grizzle, MD Authorized by: Margarita Grizzle, MD   Critical care provider statement:    Critical care time (minutes):  30   Critical care end time:  07/14/2023 2:51 PM   Critical care was necessary to treat or prevent imminent or life-threatening deterioration of the following conditions:  Respiratory failure   Critical care was time spent personally by me on the  following activities:  Development of treatment plan with patient or surrogate, discussions with consultants, evaluation of patient's response to treatment, examination of patient, ordering and review of laboratory studies, ordering and  review of radiographic studies, ordering and performing treatments and interventions, pulse oximetry, re-evaluation of patient's condition and review of old charts     Medications Ordered in ED Medications  furosemide (LASIX) injection 40 mg (40 mg Intravenous Given 07/14/23 1424)    ED Course/ Medical Decision Making/ A&P Clinical Course as of 07/14/23 1451  Fri Jul 14, 2023  1404 Patient with enlarged heart on chest x-Tranise Forrest, increasing pleural effusions and adjacent opacities [DR]  1437 CBC reviewed interpreted and stable [DR]  1437 Pleat metabolic panel reviewed interpreted appears to be within normal limits [DR]    Clinical Course User Index [DR] Margarita Grizzle, MD                                 Medical Decision Making Amount and/or Complexity of Data Reviewed Labs: ordered. Radiology: ordered.  Risk Prescription drug management.  Reviewed last echo 03/12/2020 with normal EF 77 year old male presents today complaining of cough and dyspnea for the past 2 days.  He has not had fever or chills.  Cough has not been productive.  He has not had any chest pain. Patient evaluated here in the ED with labs, EKG, chest x-Marveen Donlon.  Patient with enlarged heart on chest x-Jathan Balling and increased pleural effusions and some adjacent opacities.  I suspect that this is secondary to volume overload.  He has not had fever, COVID is negative, chest x-Naelle Diegel does not have definite lobar infiltrate, white blood cell count is normal, patient is afebrile.  Patient is being given Lasix here in the department.  Plan consultation to medicine for admission Care discussed with Dr. Katrinka Blazing who will see for admission        Final Clinical Impression(s) / ED Diagnoses Final diagnoses:   Dyspnea, unspecified type  Pleural effusion, bilateral    Rx / DC Orders ED Discharge Orders     None         Margarita Grizzle, MD 07/14/23 1451

## 2023-07-15 ENCOUNTER — Other Ambulatory Visit (HOSPITAL_COMMUNITY): Payer: Medicare Other

## 2023-07-15 ENCOUNTER — Inpatient Hospital Stay (HOSPITAL_COMMUNITY): Payer: Medicare Other

## 2023-07-15 DIAGNOSIS — I5033 Acute on chronic diastolic (congestive) heart failure: Secondary | ICD-10-CM

## 2023-07-15 DIAGNOSIS — R0600 Dyspnea, unspecified: Secondary | ICD-10-CM | POA: Diagnosis not present

## 2023-07-15 DIAGNOSIS — E1165 Type 2 diabetes mellitus with hyperglycemia: Secondary | ICD-10-CM | POA: Diagnosis not present

## 2023-07-15 DIAGNOSIS — Z8739 Personal history of other diseases of the musculoskeletal system and connective tissue: Secondary | ICD-10-CM | POA: Diagnosis not present

## 2023-07-15 DIAGNOSIS — I10 Essential (primary) hypertension: Secondary | ICD-10-CM

## 2023-07-15 DIAGNOSIS — Z794 Long term (current) use of insulin: Secondary | ICD-10-CM

## 2023-07-15 LAB — ECHOCARDIOGRAM COMPLETE
AR max vel: 2.21 cm2
AV Area VTI: 2.22 cm2
AV Area mean vel: 2.2 cm2
AV Mean grad: 4 mm[Hg]
AV Peak grad: 8.5 mm[Hg]
Ao pk vel: 1.46 m/s
Area-P 1/2: 3.93 cm2
Height: 72.008 in
S' Lateral: 3.2 cm
Weight: 3573.22 [oz_av]

## 2023-07-15 LAB — CBC
HCT: 30 % — ABNORMAL LOW (ref 39.0–52.0)
Hemoglobin: 9.2 g/dL — ABNORMAL LOW (ref 13.0–17.0)
MCH: 24.9 pg — ABNORMAL LOW (ref 26.0–34.0)
MCHC: 30.7 g/dL (ref 30.0–36.0)
MCV: 81.3 fL (ref 80.0–100.0)
Platelets: 373 10*3/uL (ref 150–400)
RBC: 3.69 MIL/uL — ABNORMAL LOW (ref 4.22–5.81)
RDW: 16.8 % — ABNORMAL HIGH (ref 11.5–15.5)
WBC: 7.6 10*3/uL (ref 4.0–10.5)
nRBC: 0 % (ref 0.0–0.2)

## 2023-07-15 LAB — BASIC METABOLIC PANEL
Anion gap: 10 (ref 5–15)
BUN: 8 mg/dL (ref 8–23)
CO2: 26 mmol/L (ref 22–32)
Calcium: 8.5 mg/dL — ABNORMAL LOW (ref 8.9–10.3)
Chloride: 101 mmol/L (ref 98–111)
Creatinine, Ser: 0.56 mg/dL — ABNORMAL LOW (ref 0.61–1.24)
GFR, Estimated: >60 mL/min (ref >60)
Glucose, Bld: 145 mg/dL — ABNORMAL HIGH (ref 70–99)
Potassium: 3.3 mmol/L — ABNORMAL LOW (ref 3.5–5.1)
Sodium: 137 mmol/L (ref 135–145)

## 2023-07-15 LAB — GLUCOSE, CAPILLARY
Glucose-Capillary: 148 mg/dL — ABNORMAL HIGH (ref 70–99)
Glucose-Capillary: 186 mg/dL — ABNORMAL HIGH (ref 70–99)
Glucose-Capillary: 223 mg/dL — ABNORMAL HIGH (ref 70–99)
Glucose-Capillary: 220 mg/dL — ABNORMAL HIGH (ref 70–99)

## 2023-07-15 LAB — MAGNESIUM: Magnesium: 1.5 mg/dL — ABNORMAL LOW (ref 1.7–2.4)

## 2023-07-15 MED ORDER — ENSURE ENLIVE PO LIQD
237.0000 mL | Freq: Two times a day (BID) | ORAL | Status: DC
  Administered 2023-07-15 – 2023-07-17 (×5): 237 mL via ORAL

## 2023-07-15 MED ORDER — FUROSEMIDE 10 MG/ML IJ SOLN
40.0000 mg | Freq: Two times a day (BID) | INTRAMUSCULAR | Status: DC
  Administered 2023-07-15 – 2023-07-16 (×3): 40 mg via INTRAVENOUS
  Filled 2023-07-15 (×3): qty 4

## 2023-07-15 MED ORDER — POTASSIUM CHLORIDE CRYS ER 20 MEQ PO TBCR
40.0000 meq | EXTENDED_RELEASE_TABLET | Freq: Once | ORAL | Status: AC
  Administered 2023-07-15: 40 meq via ORAL
  Filled 2023-07-15: qty 2

## 2023-07-15 MED ORDER — ONDANSETRON HCL 4 MG/2ML IJ SOLN
4.0000 mg | Freq: Four times a day (QID) | INTRAMUSCULAR | Status: DC | PRN
  Administered 2023-07-15 – 2023-07-16 (×2): 4 mg via INTRAVENOUS
  Filled 2023-07-15 (×3): qty 2

## 2023-07-15 MED ORDER — GERHARDT'S BUTT CREAM: TOPICAL_CREAM | Freq: Two times a day (BID) | CUTANEOUS | Status: DC | PRN

## 2023-07-15 MED ORDER — MAGNESIUM SULFATE 2 GM/50ML IV SOLN
2.0000 g | Freq: Once | INTRAVENOUS | Status: AC
  Administered 2023-07-15: 2 g via INTRAVENOUS
  Filled 2023-07-15: qty 50

## 2023-07-15 NOTE — Progress Notes (Addendum)
C/o SOB, RR @ 30s. Slight wheezing on Bilateral lung sounds. On 2L O2 sats at 97%. Dr Julian Reil was made aware. -CXR -Lasix 40mg  IV, PRN Albuterol given -Mag and potassium given as per order Care ongoing.

## 2023-07-15 NOTE — Progress Notes (Signed)
[facility]  PROGRESS NOTE  Brent [name]. WUJ:[id number] DOB: 12-Jan-1946 DOA: 07/14/2023 PCP: [name], MD   Brief HPI:   77 y.o. male with medical history significant of quadriparesis and quadriplegia due to transverse myelitis, GI fistula  s/p post wedge resection 9/24, neurogenic bladder s/p suprapubic catheter, stage IV decubitus ulcer, insulin-dependent DM type II, asthma in situ who presents with complaints of increased shortness of breath and cough over the last 2 days.  Chest x-ray noted enlarged pericardial silhouette with vascular congestion, increasing small pleural effusions, and adjacent opacities.  Review of records note patient had just recently been hospitalized 10/11-10/17 for evaluation for sacral ulcer and right foot wound which revealed right foot osteomyelitis.  Patient was seen by orthopedics and underwent a right fifth ray amputation.  ID have been consulted and recommended 6 weeks of IV Zosyn in the noted to be 08/14/23 for which PICC line was placed.  Patient was discharged to SNF.  In the [facility]-ray noted enlarged pericardial silhouette with vascular congestion, increasing small pleural effusions, and adjacent opacities.     Assessment/Plan:   Acute on chronic diastolic heart failure -BNP 168 -Chest x-ray showed enlarged pericardial silhouette with vascular congestion -Started on Lasix 40 mg IV twice daily -Echocardiogram obtained today showed EF of 60 to 65%, mild LVH -Continue atenolol, Aldactone  History of osteomyelitis of the right fifth metatarsal Septic arthritis of fifth MTP joint Patient s/p fifth ray amputation on 10/13 by orthopedics after MRI showed fifth MTP joint septic arthritis and osteomyelitis of the fifth metatarsal and fifth proximal phalanx.  OR cultures growing Providencia/Proteus/E faecalis/Bacteroides.   Scheduled to be on 6 weeks of IV Zosyn per ID until 08/14/2023.   Right upper extremity PICC in place. -Continue  Zosyn   Diabetes mellitus type 2, with long-term use of insulin -Metformin on hold -Continue sliding scale insulin NovoLog -CBG well-controlled    Essential hypertension On admission blood pressures elevated up to 183/60. -Continue atenolol, losartan, and spironolactone   Anemia of chronic disease Hemoglobin 9.5 g/dL which appears slightly improved from prior hospitalization of 8.2 on 10/17.   Hyperlipidemia -Continue atorvastatin   Depression -Continue Cymbalta   Stage IV decubitus ulcer Ischial decubitus ulcer Patient has chronic ulcers.  Prior CT imaging showed stable right-sided ischial decubitus ulcer with no fluid collections from 10/11. -Low-air-loss mattress replacement -Continue prior wound care orders as seen below Dressing procedure/placement/frequency: Cleanse entire sacrum/buttocks with Vashe [name] [phone]) wound cleanser, apply Vashe moistened gauze to open areas sacrum, buttocks and R ischium. Cover R ischial wound with dry gauze and silicone foam.  Coat surrounding skin of buttocks with Gerhardt's butt cream 3 times daily and as needed for soiling.  May cover with ABD pad (would not use silicone foam to sacrum/buttocks as this will hold moisture onto skin).   Paraplegia secondary to transverse myelitis Neurogenic bladder s/p suprapubic catheter -Continue baclofen    History of gastrocolic fistula S/p colostomy -Colostomy care   GERD -Continue PPI.  Note it appears patient is on tube but only 1 was continued   Hypomagnesemia -Magnesium 1.5 -Being replaced, check serum magnesium level in a.m.  Hypokalemia -Potassium is 3.3 -Replace potassium and check BMP in a.m.     Medications     ascorbic acid  500 mg Oral Daily   atenolol  12.5 mg Oral Daily   And   atenolol  25 mg Oral QHS   atorvastatin  40 mg Oral QHS   baclofen  20 mg Oral TID   Chlorhexidine Gluconate Cloth  6 each Topical Daily   DULoxetine  30 mg Oral QHS   enoxaparin (LOVENOX)  injection  40 mg Subcutaneous Q24H   famotidine  20 mg Oral BID   furosemide  40 mg Intravenous BID   guaiFENesin  600 mg Oral BID   insulin aspart  0-15 Units Subcutaneous TID WC   insulin glargine-yfgn  20 Units Subcutaneous BID   lamoTRIgine  50 mg Oral BID   losartan  75 mg Oral Daily   mupirocin ointment  1 Application Nasal BID   nystatin  1 Application Topical Daily   sodium chloride flush  3 mL Intravenous Q12H   spironolactone  25 mg Oral Daily   traZODone  100 mg Oral QHS   zinc sulfate  220 mg Oral Daily     Data Reviewed:   CBG:  Recent Labs  Lab 07/14/23 1707 07/14/23 2100 07/15/23 0610  GLUCAP 84 142* 148*    SpO2: 97 % O2 Flow Rate (L/min): 2 L/min    Vitals:   07/15/23 0405 07/15/23 0500 07/15/23 0530 07/15/23 0745  BP: (!) 108/48 (!) 102/41 (!) 108/46 (!) 133/93  Pulse: 62 (!) 54 (!) 54   Resp: (!) 26 (!) 23 (!) 22   Temp:    97.8 F (36.6 C)  TempSrc:    Oral  SpO2: 95% 97% 97%   Weight:      Height:          Data Reviewed:  Basic Metabolic Panel: Recent Labs  Lab 07/14/23 1210 07/15/23 0225  NA 141 137  K 3.5 3.3*  CL 104 101  CO2 27 26  GLUCOSE 112* 145*  BUN 6* 8  CREATININE 0.46* 0.56*  CALCIUM 8.7* 8.5*  MG  --  1.5*    CBC: Recent Labs  Lab 07/14/23 1210 07/15/23 0225  WBC 8.8 7.6  HGB 9.5* 9.2*  HCT 31.5* 30.0*  MCV 84.2 81.3  PLT 359 373    LFT Recent Labs  Lab 07/14/23 1210  AST 13*  ALT 12  ALKPHOS 64  BILITOT 0.4  PROT 5.6*  ALBUMIN 2.1*     Antibiotics: Anti-infectives (From admission, onward)    Start     Dose/Rate Route Frequency Ordered Stop   07/14/23 1600  piperacillin-tazobactam (ZOSYN) IVPB 3.375 g        3.375 g 12.5 mL/hr over 240 Minutes Intravenous Every 8 hours 07/14/23 1559 08/16/23 0959   07/14/23 1515  piperacillin-tazobactam (ZOSYN) IVPB  Status:  Discontinued       Note to Pharmacy: Indication: diabetic foot infection/osteomyelitis  First Dose: Yes Last Day of Therapy:  08/15/2023 Labs - Once weekly:  CBC/D and BMP, Labs - Once weekly: ESR and CRP     3.375 g Intravenous Every 6 hours 07/14/23 1514 07/14/23 1559        DVT prophylaxis: Lovenox  Code Status: Full code  Family Communication: No family at bedside   CONSULTS    Subjective   Complains of fatigue   Objective    Physical Examination:   General-appears in no acute distress Heart-S1-S2, regular, no murmur auscultated Lungs-clear to auscultation bilaterally, no wheezing or crackles auscultated Abdomen-soft, nontender, no organomegaly Extremities-no edema in the lower extremities Neuro-alert, oriented x3, no focal deficit noted  Status is: Inpatient:      Pressure Injury 07/01/23 Buttocks Bilateral Deep Tissue Pressure Injury - Purple or maroon localized area of discolored intact skin or blood-filled  blister due to damage of underlying soft tissue from pressure and/or shear. Erythema and purple discolorat (Active)  07/01/23 0732  Location: Buttocks  Location Orientation: Bilateral  Staging: Deep Tissue Pressure Injury - Purple or maroon localized area of discolored intact skin or blood-filled blister due to damage of underlying soft tissue from pressure and/or shear.  Wound Description (Comments): Erythema and purple discoloration to entire sacrum/buttocks consistent with chronic tissue damage and healing Stage 4 Pressure Injury; scattered areas of full thickness skin loss that appear pink and moist  Present on Admission: Yes     Pressure Injury 07/01/23 Ischial tuberosity Posterior;Proximal;Right Stage 3 -  Full thickness tissue loss. Subcutaneous fat may be visible but bone, tendon or muscle are NOT exposed. pink  area with white/yellow center (Active)  07/01/23 0732  Location: Ischial tuberosity  Location Orientation: Posterior;Proximal;Right  Staging: Stage 3 -  Full thickness tissue loss. Subcutaneous fat may be visible but bone, tendon or muscle are NOT exposed.   Wound Description (Comments): pink  area with white/yellow center  Present on Admission: Yes        Tranell Wojtkiewicz S Daryana Whirley   [facility] If 7PM-7AM, please contact night-coverage at [email or web address], Office  [phone]   07/15/2023, 8:46 AM  LOS: 1 day

## 2023-07-15 NOTE — Plan of Care (Signed)
  Problem: Education: Goal: Ability to describe self-care measures that may prevent or decrease complications (Diabetes Survival Skills Education) will improve Outcome: Progressing   Problem: Education: Goal: Individualized Educational Video(s) Outcome: Progressing   Problem: Coping: Goal: Ability to adjust to condition or change in health will improve Outcome: Progressing

## 2023-07-16 DIAGNOSIS — E1165 Type 2 diabetes mellitus with hyperglycemia: Secondary | ICD-10-CM | POA: Diagnosis not present

## 2023-07-16 DIAGNOSIS — I5033 Acute on chronic diastolic (congestive) heart failure: Secondary | ICD-10-CM | POA: Diagnosis not present

## 2023-07-16 DIAGNOSIS — Z8739 Personal history of other diseases of the musculoskeletal system and connective tissue: Secondary | ICD-10-CM | POA: Diagnosis not present

## 2023-07-16 DIAGNOSIS — R0600 Dyspnea, unspecified: Secondary | ICD-10-CM | POA: Diagnosis not present

## 2023-07-16 LAB — BASIC METABOLIC PANEL
Anion gap: 9 (ref 5–15)
BUN: 8 mg/dL (ref 8–23)
CO2: 30 mmol/L (ref 22–32)
Calcium: 8.3 mg/dL — ABNORMAL LOW (ref 8.9–10.3)
Chloride: 98 mmol/L (ref 98–111)
Creatinine, Ser: 0.53 mg/dL — ABNORMAL LOW (ref 0.61–1.24)
GFR, Estimated: >60 mL/min (ref >60)
Glucose, Bld: 157 mg/dL — ABNORMAL HIGH (ref 70–99)
Potassium: 3.3 mmol/L — ABNORMAL LOW (ref 3.5–5.1)
Sodium: 137 mmol/L (ref 135–145)

## 2023-07-16 LAB — MAGNESIUM: Magnesium: 1.5 mg/dL — ABNORMAL LOW (ref 1.7–2.4)

## 2023-07-16 LAB — GLUCOSE, CAPILLARY
Glucose-Capillary: 135 mg/dL — ABNORMAL HIGH (ref 70–99)
Glucose-Capillary: 208 mg/dL — ABNORMAL HIGH (ref 70–99)
Glucose-Capillary: 212 mg/dL — ABNORMAL HIGH (ref 70–99)
Glucose-Capillary: 249 mg/dL — ABNORMAL HIGH (ref 70–99)

## 2023-07-16 MED ORDER — POTASSIUM CHLORIDE 20 MEQ PO PACK
40.0000 meq | PACK | Freq: Once | ORAL | Status: AC
  Administered 2023-07-16: 40 meq via ORAL
  Filled 2023-07-16: qty 2

## 2023-07-16 MED ORDER — MAGNESIUM SULFATE 2 GM/50ML IV SOLN
2.0000 g | Freq: Once | INTRAVENOUS | Status: AC
  Administered 2023-07-16: 2 g via INTRAVENOUS
  Filled 2023-07-16: qty 50

## 2023-07-16 MED ORDER — FUROSEMIDE 40 MG PO TABS
40.0000 mg | ORAL_TABLET | Freq: Every day | ORAL | Status: DC
  Administered 2023-07-17: 40 mg via ORAL
  Filled 2023-07-16: qty 1

## 2023-07-16 MED ORDER — GERHARDT'S BUTT CREAM
TOPICAL_CREAM | Freq: Three times a day (TID) | CUTANEOUS | Status: DC
  Filled 2023-07-16: qty 1

## 2023-07-16 MED ORDER — ADULT MULTIVITAMIN W/MINERALS CH
1.0000 | ORAL_TABLET | Freq: Every day | ORAL | Status: DC
  Administered 2023-07-16 – 2023-07-17 (×2): 1 via ORAL
  Filled 2023-07-16 (×2): qty 1

## 2023-07-16 MED ORDER — JUVEN PO PACK
1.0000 | PACK | Freq: Two times a day (BID) | ORAL | Status: DC
  Administered 2023-07-16 – 2023-07-17 (×2): 1 via ORAL
  Filled 2023-07-16 (×2): qty 1

## 2023-07-16 NOTE — Progress Notes (Signed)
[facility]  PROGRESS NOTE  Woodard Danza. JYN:[id number] DOB: Oct 15, 1945 DOA: 07/14/2023 PCP: [name], MD   Brief HPI:   77 y.o. male with medical history significant of quadriparesis and quadriplegia due to transverse myelitis, GI fistula  s/p post wedge resection 9/24, neurogenic bladder s/p suprapubic catheter, stage IV decubitus ulcer, insulin-dependent DM type II, asthma in situ who presents with complaints of increased shortness of breath and cough over the last 2 days.  Chest x-ray noted enlarged pericardial silhouette with vascular congestion, increasing small pleural effusions, and adjacent opacities.  Review of records note patient had just recently been hospitalized 10/11-10/17 for evaluation for sacral ulcer and right foot wound which revealed right foot osteomyelitis.  Patient was seen by orthopedics and underwent a right fifth ray amputation.  ID have been consulted and recommended 6 weeks of IV Zosyn in the noted to be 08/14/23 for which PICC line was placed.  Patient was discharged to SNF.  In the [facility] x-ray noted enlarged pericardial silhouette with vascular congestion, increasing small pleural effusions, and adjacent opacities.     Assessment/Plan:   Acute on chronic diastolic heart failure -BNP 168 -Chest x-ray showed enlarged pericardial silhouette with vascular congestion -Started on Lasix 40 mg IV twice daily; net -4 L -Echocardiogram obtained today showed EF of 60 to 65%, mild LVH -Continue atenolol, Aldactone -Will change Lasix to 40 mg p.o. daily  History of osteomyelitis of the right fifth metatarsal Septic arthritis of fifth MTP joint Patient s/p fifth ray amputation on 10/13 by orthopedics after MRI showed fifth MTP joint septic arthritis and osteomyelitis of the fifth metatarsal and fifth proximal phalanx.  OR cultures growing Providencia/Proteus/E faecalis/Bacteroides.   Scheduled to be on 6 weeks of IV Zosyn per ID until 08/14/2023.    Right upper extremity PICC in place. -Continue Zosyn   Diabetes mellitus type 2, with long-term use of insulin -Metformin on hold -Continue sliding scale insulin NovoLog -CBG well-controlled    Essential hypertension On admission blood pressures elevated up to 183/60. -Continue atenolol, losartan, and spironolactone   Anemia of chronic disease Hemoglobin 9.5 g/dL which appears slightly improved from prior hospitalization of 8.2 on 10/17.   Hyperlipidemia -Continue atorvastatin   Depression -Continue Cymbalta   Stage IV decubitus ulcer Ischial decubitus ulcer Patient has chronic ulcers.  Prior CT imaging showed stable right-sided ischial decubitus ulcer with no fluid collections from 10/11. -Low-air-loss mattress replacement -Continue prior wound care orders as seen below Dressing procedure/placement/frequency: Cleanse entire sacrum/buttocks with Vashe [name] [phone]) wound cleanser, apply Vashe moistened gauze to open areas sacrum, buttocks and R ischium. Cover R ischial wound with dry gauze and silicone foam.  Coat surrounding skin of buttocks with Gerhardt's butt cream 3 times daily and as needed for soiling.  May cover with ABD pad (would not use silicone foam to sacrum/buttocks as this will hold moisture onto skin).   Paraplegia secondary to transverse myelitis Neurogenic bladder s/p suprapubic catheter -Continue baclofen    History of gastrocolic fistula S/p colostomy -Colostomy care   GERD -Continue PPI.  Note it appears patient is on tube but only 1 was continued   Hypomagnesemia -Magnesium 1.5 -Being replaced, check serum magnesium level in a.m.  Hypokalemia -Potassium is 3.3 -Replace potassium and check BMP in a.m.     Medications     ascorbic acid  500 mg Oral Daily   atenolol  12.5 mg Oral Daily   And   atenolol  25 mg Oral QHS  atorvastatin  40 mg Oral QHS   baclofen  20 mg Oral TID   Chlorhexidine Gluconate Cloth  6 each Topical Daily    DULoxetine  30 mg Oral QHS   enoxaparin (LOVENOX) injection  40 mg Subcutaneous Q24H   famotidine  20 mg Oral BID   feeding supplement  237 mL Oral BID BM   furosemide  40 mg Intravenous BID   Gerhardt's butt cream   Topical TID   guaiFENesin  600 mg Oral BID   insulin aspart  0-15 Units Subcutaneous TID WC   insulin glargine-yfgn  20 Units Subcutaneous BID   lamoTRIgine  50 mg Oral BID   losartan  75 mg Oral Daily   mupirocin ointment  1 Application Nasal BID   nystatin  1 Application Topical Daily   sodium chloride flush  3 mL Intravenous Q12H   spironolactone  25 mg Oral Daily   traZODone  100 mg Oral QHS   zinc sulfate  220 mg Oral Daily     Data Reviewed:   CBG:  Recent Labs  Lab 07/15/23 0610 07/15/23 1152 07/15/23 1538 07/15/23 2102 07/16/23 0600  GLUCAP 148* 186* 223* 220* 135*    SpO2: 92 % O2 Flow Rate (L/min): 2 L/min    Vitals:   07/15/23 1913 07/15/23 2135 07/15/23 2311 07/16/23 0356  BP: (!) 161/55 (!) 124/56 (!) 106/51 (!) 133/53  Pulse:  61  (!) 49  Resp:    17  Temp: 98.2 F (36.8 C) 97.8 F (36.6 C) 97.6 F (36.4 C) 97.8 F (36.6 C)  TempSrc: Oral  Oral Oral  SpO2:    92%  Weight:    97.6 kg  Height:          Data Reviewed:  Basic Metabolic Panel: Recent Labs  Lab 07/14/23 1210 07/15/23 0225 07/16/23 0430  NA 141 137 137  K 3.5 3.3* 3.3*  CL 104 101 98  CO2 27 26 30   GLUCOSE 112* 145* 157*  BUN 6* 8 8  CREATININE 0.46* 0.56* 0.53*  CALCIUM 8.7* 8.5* 8.3*  MG  --  1.5* 1.5*    CBC: Recent Labs  Lab 07/14/23 1210 07/15/23 0225  WBC 8.8 7.6  HGB 9.5* 9.2*  HCT 31.5* 30.0*  MCV 84.2 81.3  PLT 359 373    LFT Recent Labs  Lab 07/14/23 1210  AST 13*  ALT 12  ALKPHOS 64  BILITOT 0.4  PROT 5.6*  ALBUMIN 2.1*     Antibiotics: Anti-infectives (From admission, onward)    Start     Dose/Rate Route Frequency Ordered Stop   07/14/23 1600  piperacillin-tazobactam (ZOSYN) IVPB 3.375 g        3.375 g 12.5 mL/hr  over 240 Minutes Intravenous Every 8 hours 07/14/23 1559 08/16/23 0959   07/14/23 1515  piperacillin-tazobactam (ZOSYN) IVPB  Status:  Discontinued       Note to Pharmacy: Indication: diabetic foot infection/osteomyelitis  First Dose: Yes Last Day of Therapy: 08/15/2023 Labs - Once weekly:  CBC/D and BMP, Labs - Once weekly: ESR and CRP     3.375 g Intravenous Every 6 hours 07/14/23 1514 07/14/23 1559        DVT prophylaxis: Lovenox  Code Status: Full code  Family Communication: No family at bedside   CONSULTS    Subjective    Diuresing well with IV Lasix.  Denies shortness of breath.  Oxygen has been weaned off to room air.  Objective    Physical Examination:  General-appears in no acute distress Heart-S1-S2, regular, no murmur auscultated Lungs-clear to auscultation bilaterally, no wheezing or crackles auscultated Abdomen-soft, nontender, no organomegaly Extremities-no edema in the lower extremities Neuro-alert, oriented x3, no focal deficit noted  Status is: Inpatient:      Pressure Injury 07/01/23 Buttocks Bilateral Deep Tissue Pressure Injury - Purple or maroon localized area of discolored intact skin or blood-filled blister due to damage of underlying soft tissue from pressure and/or shear. Erythema and purple discolorat (Active)  07/01/23 0732  Location: Buttocks  Location Orientation: Bilateral  Staging: Deep Tissue Pressure Injury - Purple or maroon localized area of discolored intact skin or blood-filled blister due to damage of underlying soft tissue from pressure and/or shear.  Wound Description (Comments): Erythema and purple discoloration to entire sacrum/buttocks consistent with chronic tissue damage and healing Stage 4 Pressure Injury; scattered areas of full thickness skin loss that appear pink and moist  Present on Admission: Yes     Pressure Injury 07/01/23 Ischial tuberosity Posterior;Proximal;Right Stage 3 -  Full thickness tissue loss.  Subcutaneous fat may be visible but bone, tendon or muscle are NOT exposed. pink  area with white/yellow center (Active)  07/01/23 0732  Location: Ischial tuberosity  Location Orientation: Posterior;Proximal;Right  Staging: Stage 3 -  Full thickness tissue loss. Subcutaneous fat may be visible but bone, tendon or muscle are NOT exposed.  Wound Description (Comments): pink  area with white/yellow center  Present on Admission: Yes        Vernie Vinciguerra S Lucia Mccreadie   [facility] If 7PM-7AM, please contact night-coverage at [email or web address], Office  [phone]   07/16/2023, 8:14 AM  LOS: 2 days

## 2023-07-16 NOTE — Progress Notes (Addendum)
Mobility Specialist Progress Note:   07/16/23 1035  Mobility  Activity Transferred from bed to chair  Level of Assistance Dependent, patient does less than 25% (+2)  Assistive Device MaxiMove  Activity Response Tolerated well  $Mobility charge 1 Mobility  Mobility Specialist Start Time (ACUTE ONLY) 1005  Mobility Specialist Stop Time (ACUTE ONLY) 1025  Mobility Specialist Time Calculation (min) (ACUTE ONLY) 20 min    Pt received in bed, agreeable to transfer to chair per RN request. MaxA to turn patient to side for hoyer pad placement. No complaints stated, asymptomatic throughout. Pt left in chair with RN present in room.   Leory Plowman  Mobility Specialist Please contact via [employer] office at [phone]

## 2023-07-16 NOTE — Progress Notes (Signed)
Initial Nutrition Assessment  DOCUMENTATION CODES:   Not applicable  INTERVENTION:  Multivitamin with minerals Juven BID Heart healthy and diabetes nutrition handouts.    NUTRITION DIAGNOSIS:   Increased nutrient needs related to wound healing as evidenced by estimated needs.    GOAL:   Patient will meet greater than or equal to 90% of their needs    MONITOR:   PO intake, Supplement acceptance, Weight trends, Labs  REASON FOR ASSESSMENT:   Consult Assessment of nutrition requirement/status, Diet education  ASSESSMENT:  77 y.o. M, admitted for Heart Failure. PMH: quadriparesis and quadriplegia due to transverse myelitis, GI fistula s/p post wedge resection 9/24, neurogenic bladder s/p suprapubic catheter, stage IV decubitus ulcer, insulin-dependent DM type II, asthma. Patient unavailable for diet education.  Will provide handouts for discharge.  Review of EMR revealed good appetite with no changes.  Weight trending stable.  Increased nutrient needs will add MVM, Juven to assist with wounds currently receiving Ensure.   Admit weight: 97.6 kg Current weight: 97.6 kg  Weight history: 07/16/23 97.6 kg  06/30/23 98 kg  06/05/23 97 kg  05/31/23 97.3 kg  10/13/22 97.5 kg    Average Meal Intake: 25-100: 64% intake x 4 recorded meals  Nutritionally Relevant Medications: Scheduled Meds:  ascorbic acid  500 mg Oral Daily   feeding supplement  237 mL Oral BID BM   furosemide  40 mg Intravenous BID   insulin aspart  0-15 Units Subcutaneous TID WC   insulin glargine-yfgn  20 Units Subcutaneous BID   spironolactone  25 mg Oral Daily   traZODone  100 mg Oral QHS   zinc sulfate  220 mg Oral Daily    Continuous Infusions:  piperacillin-tazobactam (ZOSYN)  IV 3.375 g (07/16/23 0935)   PRN Meds:.acetaminophen **OR** acetaminophen, albuterol, LORazepam, Muscle Rub, ondansetron (ZOFRAN) IV, oxyCODONE, polyethylene glycol, polyvinyl alcohol  Labs Reviewed: K-3.3 CBG  ranges from 145-157 mg/dL over the last 24 hours HgbA1c 7.5 (06/06/23)    NUTRITION - FOCUSED PHYSICAL EXAM:  deferred Diet Order:   Diet Order             Diet heart healthy/carb modified Room service appropriate? Yes; Fluid consistency: Thin  Diet effective now                   EDUCATION NEEDS:   Education needs have been addressed  Skin:  Skin Assessment: Skin Integrity Issues: Skin Integrity Issues:: DTI DTI: Pressure, bilateral buttock Stage II: Pressure Right leg posterior lateral Stage III: Ischial tuberosity  Last BM:  10/26  Height:   Ht Readings from Last 1 Encounters:  07/14/23 6' 0.01" (1.829 m)    Weight:   Wt Readings from Last 1 Encounters:  07/16/23 97.6 kg    Ideal Body Weight:  81 kg  BMI:  Body mass index is 29.18 kg/m.  Estimated Nutritional Needs:   Kcal:  2100-2400 kcal  Protein:  105-120  Fluid:  1ML/kcal    Jamelle Haring RDN, LDN Clinical Dietitian  RDN pager # available on Amion

## 2023-07-17 DIAGNOSIS — J90 Pleural effusion, not elsewhere classified: Secondary | ICD-10-CM

## 2023-07-17 DIAGNOSIS — Z8739 Personal history of other diseases of the musculoskeletal system and connective tissue: Secondary | ICD-10-CM | POA: Diagnosis not present

## 2023-07-17 DIAGNOSIS — I5033 Acute on chronic diastolic (congestive) heart failure: Secondary | ICD-10-CM | POA: Diagnosis not present

## 2023-07-17 DIAGNOSIS — E1165 Type 2 diabetes mellitus with hyperglycemia: Secondary | ICD-10-CM | POA: Diagnosis not present

## 2023-07-17 LAB — BASIC METABOLIC PANEL
Anion gap: 9 (ref 5–15)
BUN: 12 mg/dL (ref 8–23)
CO2: 31 mmol/L (ref 22–32)
Calcium: 8.4 mg/dL — ABNORMAL LOW (ref 8.9–10.3)
Chloride: 95 mmol/L — ABNORMAL LOW (ref 98–111)
Creatinine, Ser: 0.65 mg/dL (ref 0.61–1.24)
GFR, Estimated: >60 mL/min (ref >60)
Glucose, Bld: 235 mg/dL — ABNORMAL HIGH (ref 70–99)
Potassium: 3.4 mmol/L — ABNORMAL LOW (ref 3.5–5.1)
Sodium: 135 mmol/L (ref 135–145)

## 2023-07-17 LAB — GLUCOSE, CAPILLARY
Glucose-Capillary: 223 mg/dL — ABNORMAL HIGH (ref 70–99)
Glucose-Capillary: 210 mg/dL — ABNORMAL HIGH (ref 70–99)
Glucose-Capillary: 208 mg/dL — ABNORMAL HIGH (ref 70–99)

## 2023-07-17 LAB — MAGNESIUM: Magnesium: 1.8 mg/dL (ref 1.7–2.4)

## 2023-07-17 MED ORDER — IPRATROPIUM-ALBUTEROL 0.5-2.5 (3) MG/3ML IN SOLN: 3.0000 mL | Freq: Two times a day (BID) | RESPIRATORY_TRACT | 0 refills | Status: DC

## 2023-07-17 MED ORDER — POTASSIUM CHLORIDE CRYS ER 20 MEQ PO TBCR
40.0000 meq | EXTENDED_RELEASE_TABLET | Freq: Once | ORAL | Status: AC
  Administered 2023-07-17: 40 meq via ORAL
  Filled 2023-07-17: qty 2

## 2023-07-17 MED ORDER — POTASSIUM CHLORIDE CRYS ER 20 MEQ PO TBCR: 20.0000 meq | EXTENDED_RELEASE_TABLET | Freq: Every day | ORAL | Status: DC

## 2023-07-17 MED ORDER — FUROSEMIDE 40 MG PO TABS: 40.0000 mg | ORAL_TABLET | Freq: Every day | ORAL | Status: DC

## 2023-07-17 MED ORDER — OXYCODONE HCL 5 MG PO TABS: 5.0000 mg | ORAL_TABLET | Freq: Two times a day (BID) | ORAL | 0 refills | Status: DC | PRN

## 2023-07-17 NOTE — Progress Notes (Signed)
Report called to [facility]. No further questions from nurse. Awaiting transport.

## 2023-07-17 NOTE — Plan of Care (Signed)
  Problem: Education: Goal: Ability to describe self-care measures that may prevent or decrease complications (Diabetes Survival Skills Education) will improve Outcome: Adequate for Discharge Goal: Individualized Educational Video(s) Outcome: Adequate for Discharge   Problem: Coping: Goal: Ability to adjust to condition or change in health will improve 07/17/2023 1359 by Garth Bigness, RN Outcome: Adequate for Discharge 07/17/2023 1005 by Garth Bigness, RN Outcome: Progressing   Problem: Fluid Volume: Goal: Ability to maintain a balanced intake and output will improve Outcome: Adequate for Discharge   Problem: Health Behavior/Discharge Planning: Goal: Ability to identify and utilize available resources and services will improve Outcome: Adequate for Discharge Goal: Ability to manage health-related needs will improve Outcome: Adequate for Discharge   Problem: Metabolic: Goal: Ability to maintain appropriate glucose levels will improve Outcome: Adequate for Discharge   Problem: Nutritional: Goal: Maintenance of adequate nutrition will improve 07/17/2023 1359 by Garth Bigness, RN Outcome: Adequate for Discharge 07/17/2023 1005 by Garth Bigness, RN Outcome: Progressing Goal: Progress toward achieving an optimal weight will improve 07/17/2023 1359 by Garth Bigness, RN Outcome: Adequate for Discharge 07/17/2023 1005 by Garth Bigness, RN Outcome: Progressing   Problem: Skin Integrity: Goal: Risk for impaired skin integrity will decrease 07/17/2023 1359 by Garth Bigness, RN Outcome: Adequate for Discharge 07/17/2023 1005 by Garth Bigness, RN Outcome: Progressing   Problem: Tissue Perfusion: Goal: Adequacy of tissue perfusion will improve Outcome: Adequate for Discharge   Problem: Education: Goal: Knowledge of General Education information will improve Description: Including pain rating scale, medication(s)/side effects and non-pharmacologic  comfort measures Outcome: Adequate for Discharge   Problem: Health Behavior/Discharge Planning: Goal: Ability to manage health-related needs will improve Outcome: Adequate for Discharge   Problem: Clinical Measurements: Goal: Ability to maintain clinical measurements within normal limits will improve Outcome: Adequate for Discharge Goal: Will remain free from infection Outcome: Adequate for Discharge Goal: Diagnostic test results will improve Outcome: Adequate for Discharge Goal: Respiratory complications will improve Outcome: Adequate for Discharge Goal: Cardiovascular complication will be avoided Outcome: Adequate for Discharge   Problem: Activity: Goal: Risk for activity intolerance will decrease Outcome: Adequate for Discharge   Problem: Nutrition: Goal: Adequate nutrition will be maintained Outcome: Adequate for Discharge   Problem: Coping: Goal: Level of anxiety will decrease Outcome: Adequate for Discharge   Problem: Elimination: Goal: Will not experience complications related to bowel motility Outcome: Adequate for Discharge Goal: Will not experience complications related to urinary retention Outcome: Adequate for Discharge   Problem: Pain Management: Goal: General experience of comfort will improve Outcome: Adequate for Discharge   Problem: Safety: Goal: Ability to remain free from injury will improve Outcome: Adequate for Discharge   Problem: Skin Integrity: Goal: Risk for impaired skin integrity will decrease Outcome: Adequate for Discharge

## 2023-07-17 NOTE — Discharge Summary (Signed)
Discharge Exam: Filed Weights   07/15/23 0302 07/16/23 0356 07/17/23 0235  Weight: 101.3 kg 97.6 kg 97 kg   General-appears in no acute distress Heart-S1-S2, regular, no murmur auscultated Lungs-clear to auscultation bilaterally, no wheezing or crackles auscultated Abdomen-soft, nontender, no organomegaly Extremities-no edema in the lower extremities Neuro-alert, oriented x3, no focal deficit noted  Condition at discharge: good  The results of significant diagnostics from this hospitalization (including imaging, microbiology, ancillary and laboratory) are listed below for reference.   Imaging Studies: ECHOCARDIOGRAM COMPLETE  Result Date: 07/15/2023    ECHOCARDIOGRAM REPORT   Patient Name:   Khup Merriweather. Date of Exam: 07/15/2023 Medical Rec #:  [id number]           Height:       72.0 in Accession #:    [id number]          Weight:       223.3 lb Date of Birth:  1946/07/27          BSA:          2.233 m Patient Age:    77 years            BP:           133/93 mmHg Patient Gender: M                   HR:           53 bpm. Exam Location:  Inpatient Procedure: 2D Echo, Color Doppler and Cardiac Doppler Indications:    CHF  History:        Patient has prior history of Echocardiogram examinations, most                 recent 03/12/2020. Risk  Factors:Dyslipidemia, Hypertension and                 Diabetes.  Sonographer:    Milbert Coulter Referring Phys: [id number] RONDELL A SMITH IMPRESSIONS  1. Left ventricular ejection fraction, by estimation, is 60 to 65%. The left ventricle has normal function. The left ventricle has no regional wall motion abnormalities. There is mild left ventricular hypertrophy. Left ventricular diastolic parameters were normal.  2. Right ventricular systolic function is normal. The right ventricular size is normal. Tricuspid regurgitation signal is inadequate for assessing PA pressure.  3. A small pericardial effusion is present.  4. The mitral valve is normal in structure. Trivial mitral valve regurgitation. No evidence of mitral stenosis.  5. The aortic valve is tricuspid. Aortic valve regurgitation is not visualized. Aortic valve sclerosis is present, with no evidence of aortic valve stenosis.  6. The inferior vena cava is dilated in size with >50% respiratory variability, suggesting right atrial pressure of 8 mmHg. FINDINGS  Left Ventricle: Left ventricular ejection fraction, by estimation, is 60 to 65%. The left ventricle has normal function. The left ventricle has no regional wall motion abnormalities. The left ventricular internal cavity size was normal in size. There is  mild left ventricular hypertrophy. Left ventricular diastolic parameters were normal. Right Ventricle: The right ventricular size is normal. No increase in right ventricular wall thickness. Right ventricular systolic function is normal. Tricuspid regurgitation signal is inadequate for assessing PA pressure. Left Atrium: Left atrial size was normal in size. Right Atrium: Right atrial size was normal in size. Pericardium: A small pericardial effusion is present. Presence of epicardial fat layer. Mitral Valve: The mitral valve is normal in structure. Trivial mitral valve regurgitation. No evidence  Discharge Exam: Filed Weights   07/15/23 0302 07/16/23 0356 07/17/23 0235  Weight: 101.3 kg 97.6 kg 97 kg   General-appears in no acute distress Heart-S1-S2, regular, no murmur auscultated Lungs-clear to auscultation bilaterally, no wheezing or crackles auscultated Abdomen-soft, nontender, no organomegaly Extremities-no edema in the lower extremities Neuro-alert, oriented x3, no focal deficit noted  Condition at discharge: good  The results of significant diagnostics from this hospitalization (including imaging, microbiology, ancillary and laboratory) are listed below for reference.   Imaging Studies: ECHOCARDIOGRAM COMPLETE  Result Date: 07/15/2023    ECHOCARDIOGRAM REPORT   Patient Name:   Khup Merriweather. Date of Exam: 07/15/2023 Medical Rec #:  [id number]           Height:       72.0 in Accession #:    [id number]          Weight:       223.3 lb Date of Birth:  1946/07/27          BSA:          2.233 m Patient Age:    77 years            BP:           133/93 mmHg Patient Gender: M                   HR:           53 bpm. Exam Location:  Inpatient Procedure: 2D Echo, Color Doppler and Cardiac Doppler Indications:    CHF  History:        Patient has prior history of Echocardiogram examinations, most                 recent 03/12/2020. Risk  Factors:Dyslipidemia, Hypertension and                 Diabetes.  Sonographer:    Milbert Coulter Referring Phys: [id number] RONDELL A SMITH IMPRESSIONS  1. Left ventricular ejection fraction, by estimation, is 60 to 65%. The left ventricle has normal function. The left ventricle has no regional wall motion abnormalities. There is mild left ventricular hypertrophy. Left ventricular diastolic parameters were normal.  2. Right ventricular systolic function is normal. The right ventricular size is normal. Tricuspid regurgitation signal is inadequate for assessing PA pressure.  3. A small pericardial effusion is present.  4. The mitral valve is normal in structure. Trivial mitral valve regurgitation. No evidence of mitral stenosis.  5. The aortic valve is tricuspid. Aortic valve regurgitation is not visualized. Aortic valve sclerosis is present, with no evidence of aortic valve stenosis.  6. The inferior vena cava is dilated in size with >50% respiratory variability, suggesting right atrial pressure of 8 mmHg. FINDINGS  Left Ventricle: Left ventricular ejection fraction, by estimation, is 60 to 65%. The left ventricle has normal function. The left ventricle has no regional wall motion abnormalities. The left ventricular internal cavity size was normal in size. There is  mild left ventricular hypertrophy. Left ventricular diastolic parameters were normal. Right Ventricle: The right ventricular size is normal. No increase in right ventricular wall thickness. Right ventricular systolic function is normal. Tricuspid regurgitation signal is inadequate for assessing PA pressure. Left Atrium: Left atrial size was normal in size. Right Atrium: Right atrial size was normal in size. Pericardium: A small pericardial effusion is present. Presence of epicardial fat layer. Mitral Valve: The mitral valve is normal in structure. Trivial mitral valve regurgitation. No evidence  Discharge Exam: Filed Weights   07/15/23 0302 07/16/23 0356 07/17/23 0235  Weight: 101.3 kg 97.6 kg 97 kg   General-appears in no acute distress Heart-S1-S2, regular, no murmur auscultated Lungs-clear to auscultation bilaterally, no wheezing or crackles auscultated Abdomen-soft, nontender, no organomegaly Extremities-no edema in the lower extremities Neuro-alert, oriented x3, no focal deficit noted  Condition at discharge: good  The results of significant diagnostics from this hospitalization (including imaging, microbiology, ancillary and laboratory) are listed below for reference.   Imaging Studies: ECHOCARDIOGRAM COMPLETE  Result Date: 07/15/2023    ECHOCARDIOGRAM REPORT   Patient Name:   Khup Merriweather. Date of Exam: 07/15/2023 Medical Rec #:  [id number]           Height:       72.0 in Accession #:    [id number]          Weight:       223.3 lb Date of Birth:  1946/07/27          BSA:          2.233 m Patient Age:    77 years            BP:           133/93 mmHg Patient Gender: M                   HR:           53 bpm. Exam Location:  Inpatient Procedure: 2D Echo, Color Doppler and Cardiac Doppler Indications:    CHF  History:        Patient has prior history of Echocardiogram examinations, most                 recent 03/12/2020. Risk  Factors:Dyslipidemia, Hypertension and                 Diabetes.  Sonographer:    Milbert Coulter Referring Phys: [id number] RONDELL A SMITH IMPRESSIONS  1. Left ventricular ejection fraction, by estimation, is 60 to 65%. The left ventricle has normal function. The left ventricle has no regional wall motion abnormalities. There is mild left ventricular hypertrophy. Left ventricular diastolic parameters were normal.  2. Right ventricular systolic function is normal. The right ventricular size is normal. Tricuspid regurgitation signal is inadequate for assessing PA pressure.  3. A small pericardial effusion is present.  4. The mitral valve is normal in structure. Trivial mitral valve regurgitation. No evidence of mitral stenosis.  5. The aortic valve is tricuspid. Aortic valve regurgitation is not visualized. Aortic valve sclerosis is present, with no evidence of aortic valve stenosis.  6. The inferior vena cava is dilated in size with >50% respiratory variability, suggesting right atrial pressure of 8 mmHg. FINDINGS  Left Ventricle: Left ventricular ejection fraction, by estimation, is 60 to 65%. The left ventricle has normal function. The left ventricle has no regional wall motion abnormalities. The left ventricular internal cavity size was normal in size. There is  mild left ventricular hypertrophy. Left ventricular diastolic parameters were normal. Right Ventricle: The right ventricular size is normal. No increase in right ventricular wall thickness. Right ventricular systolic function is normal. Tricuspid regurgitation signal is inadequate for assessing PA pressure. Left Atrium: Left atrial size was normal in size. Right Atrium: Right atrial size was normal in size. Pericardium: A small pericardial effusion is present. Presence of epicardial fat layer. Mitral Valve: The mitral valve is normal in structure. Trivial mitral valve regurgitation. No evidence  COMPARISON:  Chest x-ray 07/14/2023. FINDINGS: There is a right upper extremity PICC with tip terminating in the superior cavoatrial junction. Lung volumes are low. Bibasilar opacities which may reflect areas of atelectasis and/or consolidation. Moderate bilateral pleural effusions. No pneumothorax. No evidence of pulmonary edema. Diffuse interstitial prominence and peribronchial cuffing. Heart size is mildly enlarged. The patient is rotated to the left on today's exam, resulting in distortion of the mediastinal contours and reduced diagnostic sensitivity and specificity for mediastinal pathology. Atherosclerotic calcifications are noted in the thoracic aorta. IMPRESSION: 1. Support apparatus, as above. 2. Persistent moderate bilateral pleural effusions with bibasilar areas of atelectasis and/or consolidation. 3. Cardiomegaly. 4. Aortic atherosclerosis. Electronically Signed   By: Trudie Reed M.D.   On: 07/15/2023 07:37   DG Chest Port 1 View  Result Date: 07/14/2023 CLINICAL DATA:  Cough and dyspnea EXAM: PORTABLE CHEST 1 VIEW COMPARISON:  07/04/2023 x-ray FINDINGS: Film is rotated to the left. Enlarged cardiopericardial silhouette with calcified aorta. There are some vascular congestion. Small pleural effusions with the adjacent opacities. No pneumothorax. Right-sided PICC with the tip along the central SVC. Overlapping cardiac leads. Degenerative changes of the spine. IMPRESSION: Enlarged cardiopericardial silhouette with vascular congestion. Increasing small pleural effusions. Adjacent opacities.  Recommend follow-up. Right-sided PICC Electronically Signed   By: Karen Kays M.D.   On: 07/14/2023 13:05   [place] EKG SITE RITE  Result Date: 07/06/2023 If Site Rite image not attached, placement could not be confirmed due to current cardiac rhythm.  DG Shoulder Left Port  Result Date: 07/04/2023 CLINICAL DATA:  Left shoulder pain EXAM: LEFT SHOULDER COMPARISON:  None Available. FINDINGS: There is no evidence of fracture or dislocation. Mild-moderate osteoarthritic changes of the glenohumeral and acromioclavicular joints. Soft tissues are unremarkable. 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 No acute findings. Electronically Signed   By: Duanne Guess D.O.   On: 07/04/2023 15:34   DG Chest 1 View  Result Date: 07/04/2023 CLINICAL DATA:  141880 SOB (shortness of breath) 141880 242339 Left shoulder pain 242339 92568 Pleuritic pain 40981 EXAM: CHEST  1 VIEW COMPARISON:  CXR 05/27/23 FINDINGS: Cardiomegaly. Likely a small left pleural effusion. There is a hazy opacity at the left lung base could represent atelectasis or infection. No radiographically apparent displaced rib fracture. Visualized upper abdomen unremarkable. IMPRESSION: 1. Hazy opacity at the left lung base could represent atelectasis or infection. 2. Likely a small left pleural effusion. Electronically Signed   By: Lorenza Cambridge M.D.   On: 07/04/2023 14:25   VAS [place] ABI WITH/WO TBI  Result Date: 07/03/2023  LOWER EXTREMITY DOPPLER STUDY Patient Name:  ANDRA MOTTON.  Date of Exam:   07/03/2023 Medical Rec #: [id number]            Accession #:    [id number] Date of Birth: 1946-05-02           Patient Gender: M Patient Age:   16 years Exam Location:  [facility] Procedure:      VAS [place] ABI WITH/WO TBI Referring Phys: Brendia Sacks --------------------------------------------------------------------------------  Indications: Osteophyte, right foot [[id number]] High Risk Factors: Hypertension,  hyperlipidemia, Diabetes.  Vascular Interventions: 07/02/2023 - Right AMPUTATION TOE FIFTH RAY. Limitations: Today's exam was limited due to an open wound and bandages. Comparison Study: 03/08/2020 - Right: Resting right ankle-brachial index is                   within normal range. No  Discharge Exam: Filed Weights   07/15/23 0302 07/16/23 0356 07/17/23 0235  Weight: 101.3 kg 97.6 kg 97 kg   General-appears in no acute distress Heart-S1-S2, regular, no murmur auscultated Lungs-clear to auscultation bilaterally, no wheezing or crackles auscultated Abdomen-soft, nontender, no organomegaly Extremities-no edema in the lower extremities Neuro-alert, oriented x3, no focal deficit noted  Condition at discharge: good  The results of significant diagnostics from this hospitalization (including imaging, microbiology, ancillary and laboratory) are listed below for reference.   Imaging Studies: ECHOCARDIOGRAM COMPLETE  Result Date: 07/15/2023    ECHOCARDIOGRAM REPORT   Patient Name:   Khup Merriweather. Date of Exam: 07/15/2023 Medical Rec #:  [id number]           Height:       72.0 in Accession #:    [id number]          Weight:       223.3 lb Date of Birth:  1946/07/27          BSA:          2.233 m Patient Age:    77 years            BP:           133/93 mmHg Patient Gender: M                   HR:           53 bpm. Exam Location:  Inpatient Procedure: 2D Echo, Color Doppler and Cardiac Doppler Indications:    CHF  History:        Patient has prior history of Echocardiogram examinations, most                 recent 03/12/2020. Risk  Factors:Dyslipidemia, Hypertension and                 Diabetes.  Sonographer:    Milbert Coulter Referring Phys: [id number] RONDELL A SMITH IMPRESSIONS  1. Left ventricular ejection fraction, by estimation, is 60 to 65%. The left ventricle has normal function. The left ventricle has no regional wall motion abnormalities. There is mild left ventricular hypertrophy. Left ventricular diastolic parameters were normal.  2. Right ventricular systolic function is normal. The right ventricular size is normal. Tricuspid regurgitation signal is inadequate for assessing PA pressure.  3. A small pericardial effusion is present.  4. The mitral valve is normal in structure. Trivial mitral valve regurgitation. No evidence of mitral stenosis.  5. The aortic valve is tricuspid. Aortic valve regurgitation is not visualized. Aortic valve sclerosis is present, with no evidence of aortic valve stenosis.  6. The inferior vena cava is dilated in size with >50% respiratory variability, suggesting right atrial pressure of 8 mmHg. FINDINGS  Left Ventricle: Left ventricular ejection fraction, by estimation, is 60 to 65%. The left ventricle has normal function. The left ventricle has no regional wall motion abnormalities. The left ventricular internal cavity size was normal in size. There is  mild left ventricular hypertrophy. Left ventricular diastolic parameters were normal. Right Ventricle: The right ventricular size is normal. No increase in right ventricular wall thickness. Right ventricular systolic function is normal. Tricuspid regurgitation signal is inadequate for assessing PA pressure. Left Atrium: Left atrial size was normal in size. Right Atrium: Right atrial size was normal in size. Pericardium: A small pericardial effusion is present. Presence of epicardial fat layer. Mitral Valve: The mitral valve is normal in structure. Trivial mitral valve regurgitation. No evidence  COMPARISON:  Chest x-ray 07/14/2023. FINDINGS: There is a right upper extremity PICC with tip terminating in the superior cavoatrial junction. Lung volumes are low. Bibasilar opacities which may reflect areas of atelectasis and/or consolidation. Moderate bilateral pleural effusions. No pneumothorax. No evidence of pulmonary edema. Diffuse interstitial prominence and peribronchial cuffing. Heart size is mildly enlarged. The patient is rotated to the left on today's exam, resulting in distortion of the mediastinal contours and reduced diagnostic sensitivity and specificity for mediastinal pathology. Atherosclerotic calcifications are noted in the thoracic aorta. IMPRESSION: 1. Support apparatus, as above. 2. Persistent moderate bilateral pleural effusions with bibasilar areas of atelectasis and/or consolidation. 3. Cardiomegaly. 4. Aortic atherosclerosis. Electronically Signed   By: Trudie Reed M.D.   On: 07/15/2023 07:37   DG Chest Port 1 View  Result Date: 07/14/2023 CLINICAL DATA:  Cough and dyspnea EXAM: PORTABLE CHEST 1 VIEW COMPARISON:  07/04/2023 x-ray FINDINGS: Film is rotated to the left. Enlarged cardiopericardial silhouette with calcified aorta. There are some vascular congestion. Small pleural effusions with the adjacent opacities. No pneumothorax. Right-sided PICC with the tip along the central SVC. Overlapping cardiac leads. Degenerative changes of the spine. IMPRESSION: Enlarged cardiopericardial silhouette with vascular congestion. Increasing small pleural effusions. Adjacent opacities.  Recommend follow-up. Right-sided PICC Electronically Signed   By: Karen Kays M.D.   On: 07/14/2023 13:05   [place] EKG SITE RITE  Result Date: 07/06/2023 If Site Rite image not attached, placement could not be confirmed due to current cardiac rhythm.  DG Shoulder Left Port  Result Date: 07/04/2023 CLINICAL DATA:  Left shoulder pain EXAM: LEFT SHOULDER COMPARISON:  None Available. FINDINGS: There is no evidence of fracture or dislocation. Mild-moderate osteoarthritic changes of the glenohumeral and acromioclavicular joints. Soft tissues are unremarkable. 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 No acute findings. Electronically Signed   By: Duanne Guess D.O.   On: 07/04/2023 15:34   DG Chest 1 View  Result Date: 07/04/2023 CLINICAL DATA:  141880 SOB (shortness of breath) 141880 242339 Left shoulder pain 242339 92568 Pleuritic pain 40981 EXAM: CHEST  1 VIEW COMPARISON:  CXR 05/27/23 FINDINGS: Cardiomegaly. Likely a small left pleural effusion. There is a hazy opacity at the left lung base could represent atelectasis or infection. No radiographically apparent displaced rib fracture. Visualized upper abdomen unremarkable. IMPRESSION: 1. Hazy opacity at the left lung base could represent atelectasis or infection. 2. Likely a small left pleural effusion. Electronically Signed   By: Lorenza Cambridge M.D.   On: 07/04/2023 14:25   VAS [place] ABI WITH/WO TBI  Result Date: 07/03/2023  LOWER EXTREMITY DOPPLER STUDY Patient Name:  ANDRA MOTTON.  Date of Exam:   07/03/2023 Medical Rec #: [id number]            Accession #:    [id number] Date of Birth: 1946-05-02           Patient Gender: M Patient Age:   16 years Exam Location:  [facility] Procedure:      VAS [place] ABI WITH/WO TBI Referring Phys: Brendia Sacks --------------------------------------------------------------------------------  Indications: Osteophyte, right foot [[id number]] High Risk Factors: Hypertension,  hyperlipidemia, Diabetes.  Vascular Interventions: 07/02/2023 - Right AMPUTATION TOE FIFTH RAY. Limitations: Today's exam was limited due to an open wound and bandages. Comparison Study: 03/08/2020 - Right: Resting right ankle-brachial index is                   within normal range. No  COMPARISON:  Chest x-ray 07/14/2023. FINDINGS: There is a right upper extremity PICC with tip terminating in the superior cavoatrial junction. Lung volumes are low. Bibasilar opacities which may reflect areas of atelectasis and/or consolidation. Moderate bilateral pleural effusions. No pneumothorax. No evidence of pulmonary edema. Diffuse interstitial prominence and peribronchial cuffing. Heart size is mildly enlarged. The patient is rotated to the left on today's exam, resulting in distortion of the mediastinal contours and reduced diagnostic sensitivity and specificity for mediastinal pathology. Atherosclerotic calcifications are noted in the thoracic aorta. IMPRESSION: 1. Support apparatus, as above. 2. Persistent moderate bilateral pleural effusions with bibasilar areas of atelectasis and/or consolidation. 3. Cardiomegaly. 4. Aortic atherosclerosis. Electronically Signed   By: Trudie Reed M.D.   On: 07/15/2023 07:37   DG Chest Port 1 View  Result Date: 07/14/2023 CLINICAL DATA:  Cough and dyspnea EXAM: PORTABLE CHEST 1 VIEW COMPARISON:  07/04/2023 x-ray FINDINGS: Film is rotated to the left. Enlarged cardiopericardial silhouette with calcified aorta. There are some vascular congestion. Small pleural effusions with the adjacent opacities. No pneumothorax. Right-sided PICC with the tip along the central SVC. Overlapping cardiac leads. Degenerative changes of the spine. IMPRESSION: Enlarged cardiopericardial silhouette with vascular congestion. Increasing small pleural effusions. Adjacent opacities.  Recommend follow-up. Right-sided PICC Electronically Signed   By: Karen Kays M.D.   On: 07/14/2023 13:05   [place] EKG SITE RITE  Result Date: 07/06/2023 If Site Rite image not attached, placement could not be confirmed due to current cardiac rhythm.  DG Shoulder Left Port  Result Date: 07/04/2023 CLINICAL DATA:  Left shoulder pain EXAM: LEFT SHOULDER COMPARISON:  None Available. FINDINGS: There is no evidence of fracture or dislocation. Mild-moderate osteoarthritic changes of the glenohumeral and acromioclavicular joints. Soft tissues are unremarkable. 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 No acute findings. Electronically Signed   By: Duanne Guess D.O.   On: 07/04/2023 15:34   DG Chest 1 View  Result Date: 07/04/2023 CLINICAL DATA:  141880 SOB (shortness of breath) 141880 242339 Left shoulder pain 242339 92568 Pleuritic pain 40981 EXAM: CHEST  1 VIEW COMPARISON:  CXR 05/27/23 FINDINGS: Cardiomegaly. Likely a small left pleural effusion. There is a hazy opacity at the left lung base could represent atelectasis or infection. No radiographically apparent displaced rib fracture. Visualized upper abdomen unremarkable. IMPRESSION: 1. Hazy opacity at the left lung base could represent atelectasis or infection. 2. Likely a small left pleural effusion. Electronically Signed   By: Lorenza Cambridge M.D.   On: 07/04/2023 14:25   VAS [place] ABI WITH/WO TBI  Result Date: 07/03/2023  LOWER EXTREMITY DOPPLER STUDY Patient Name:  ANDRA MOTTON.  Date of Exam:   07/03/2023 Medical Rec #: [id number]            Accession #:    [id number] Date of Birth: 1946-05-02           Patient Gender: M Patient Age:   16 years Exam Location:  [facility] Procedure:      VAS [place] ABI WITH/WO TBI Referring Phys: Brendia Sacks --------------------------------------------------------------------------------  Indications: Osteophyte, right foot [[id number]] High Risk Factors: Hypertension,  hyperlipidemia, Diabetes.  Vascular Interventions: 07/02/2023 - Right AMPUTATION TOE FIFTH RAY. Limitations: Today's exam was limited due to an open wound and bandages. Comparison Study: 03/08/2020 - Right: Resting right ankle-brachial index is                   within normal range. No  Physician Discharge Summary   Patient: Otilio Loria. MRN: [id number] DOB: Jul 29, 1946  Admit date:     07/14/2023  Discharge date: 07/17/23  Discharge Physician: Meredeth Ide   PCP: [name], MD   Recommendations at discharge:   Check BMP in 3 days at skilled nursing facility Continue antibiotics IV Zosyn, as prescribed before till 08/15/2023  Discharge Diagnoses: Principal Problem:   (HFpEF) heart failure with preserved ejection fraction (HCC) Active Problems:   History of osteomyelitis   Hyperlipidemia   DEPRESSION, SITUATIONAL, ACUTE   Essential hypertension   Paraplegia (HCC)   Controlled type 2 diabetes mellitus with hyperglycemia, with long-term current use of insulin (HCC)   Transverse myelitis (HCC)   Stage IV pressure ulcer of sacral region (HCC)   Normocytic anemia   GERD (gastroesophageal reflux disease)  Resolved Problems:   * No resolved hospital problems. *  Hospital Course:  77 y.o. male with medical history significant of quadriparesis and quadriplegia due to transverse myelitis, GI fistula  s/p post wedge resection 9/24, neurogenic bladder s/p suprapubic catheter, stage IV decubitus ulcer, insulin-dependent DM type II, asthma in situ who presents with complaints of increased shortness of breath and cough over the last 2 days.  Chest x-ray noted enlarged pericardial silhouette with vascular congestion, increasing small pleural effusions, and adjacent opacities.  Review of records note patient had just recently been hospitalized 10/11-10/17 for evaluation for sacral ulcer and right foot wound which revealed right foot osteomyelitis.  Patient was seen by orthopedics and underwent a right fifth ray amputation.  ID have been consulted and recommended 6 weeks of IV Zosyn in the noted to be 08/14/23 for which PICC line was placed.  Patient was discharged to SNF.   In the [facility] x-ray noted enlarged pericardial silhouette with vascular congestion,  increasing small pleural effusions, and adjacent opacities  Assessment and Plan:  Acute on chronic diastolic heart failure -BNP 168 -Chest x-ray showed enlarged pericardial silhouette with vascular congestion -Started on Lasix 40 mg IV twice daily -Echocardiogram obtained today showed EF of 60 to 65%, mild LVH -Continue atenolol, Aldactone -Diuresed well, not requiring oxygen anymore -Will discharge on Lasix 40 mg p.o. daily   History of osteomyelitis of the right fifth metatarsal Septic arthritis of fifth MTP joint Patient s/p fifth ray amputation on 10/13 by orthopedics after MRI showed fifth MTP joint septic arthritis and osteomyelitis of the fifth metatarsal and fifth proximal phalanx.  OR cultures growing Providencia/Proteus/E faecalis/Bacteroides.   Scheduled to be on 6 weeks of IV Zosyn per ID until 08/15/2023.   Right upper extremity PICC in place. -Continue Zosyn till 08/15/2023   Diabetes mellitus type 2, with long-term use of insulin -Metformin  -Continue sliding scale insulin NovoLog -CBG well-controlled     Essential hypertension On admission blood pressures elevated up to 183/60. -Continue atenolol, losartan, and spironolactone   Anemia of chronic disease Hemoglobin 9.5 g/dL which appears slightly improved from prior hospitalization of 8.2 on 10/17.   Hyperlipidemia -Continue atorvastatin   Depression -Continue Cymbalta   Stage IV decubitus ulcer Ischial decubitus ulcer Patient has chronic ulcers.  Prior CT imaging showed stable right-sided ischial decubitus ulcer with no fluid collections from 10/11. -Low-air-loss mattress replacement -Continue prior wound care orders as seen below Dressing procedure/placement/frequency: Cleanse entire sacrum/buttocks with Vashe [name] [phone]) wound cleanser, apply Vashe moistened gauze to open areas sacrum, buttocks and R ischium. Cover R ischial wound with dry gauze and silicone foam.  Coat surrounding skin  Discharge Exam: Filed Weights   07/15/23 0302 07/16/23 0356 07/17/23 0235  Weight: 101.3 kg 97.6 kg 97 kg   General-appears in no acute distress Heart-S1-S2, regular, no murmur auscultated Lungs-clear to auscultation bilaterally, no wheezing or crackles auscultated Abdomen-soft, nontender, no organomegaly Extremities-no edema in the lower extremities Neuro-alert, oriented x3, no focal deficit noted  Condition at discharge: good  The results of significant diagnostics from this hospitalization (including imaging, microbiology, ancillary and laboratory) are listed below for reference.   Imaging Studies: ECHOCARDIOGRAM COMPLETE  Result Date: 07/15/2023    ECHOCARDIOGRAM REPORT   Patient Name:   Khup Merriweather. Date of Exam: 07/15/2023 Medical Rec #:  [id number]           Height:       72.0 in Accession #:    [id number]          Weight:       223.3 lb Date of Birth:  1946/07/27          BSA:          2.233 m Patient Age:    77 years            BP:           133/93 mmHg Patient Gender: M                   HR:           53 bpm. Exam Location:  Inpatient Procedure: 2D Echo, Color Doppler and Cardiac Doppler Indications:    CHF  History:        Patient has prior history of Echocardiogram examinations, most                 recent 03/12/2020. Risk  Factors:Dyslipidemia, Hypertension and                 Diabetes.  Sonographer:    Milbert Coulter Referring Phys: [id number] RONDELL A SMITH IMPRESSIONS  1. Left ventricular ejection fraction, by estimation, is 60 to 65%. The left ventricle has normal function. The left ventricle has no regional wall motion abnormalities. There is mild left ventricular hypertrophy. Left ventricular diastolic parameters were normal.  2. Right ventricular systolic function is normal. The right ventricular size is normal. Tricuspid regurgitation signal is inadequate for assessing PA pressure.  3. A small pericardial effusion is present.  4. The mitral valve is normal in structure. Trivial mitral valve regurgitation. No evidence of mitral stenosis.  5. The aortic valve is tricuspid. Aortic valve regurgitation is not visualized. Aortic valve sclerosis is present, with no evidence of aortic valve stenosis.  6. The inferior vena cava is dilated in size with >50% respiratory variability, suggesting right atrial pressure of 8 mmHg. FINDINGS  Left Ventricle: Left ventricular ejection fraction, by estimation, is 60 to 65%. The left ventricle has normal function. The left ventricle has no regional wall motion abnormalities. The left ventricular internal cavity size was normal in size. There is  mild left ventricular hypertrophy. Left ventricular diastolic parameters were normal. Right Ventricle: The right ventricular size is normal. No increase in right ventricular wall thickness. Right ventricular systolic function is normal. Tricuspid regurgitation signal is inadequate for assessing PA pressure. Left Atrium: Left atrial size was normal in size. Right Atrium: Right atrial size was normal in size. Pericardium: A small pericardial effusion is present. Presence of epicardial fat layer. Mitral Valve: The mitral valve is normal in structure. Trivial mitral valve regurgitation. No evidence  Physician Discharge Summary   Patient: Otilio Loria. MRN: [id number] DOB: Jul 29, 1946  Admit date:     07/14/2023  Discharge date: 07/17/23  Discharge Physician: Meredeth Ide   PCP: [name], MD   Recommendations at discharge:   Check BMP in 3 days at skilled nursing facility Continue antibiotics IV Zosyn, as prescribed before till 08/15/2023  Discharge Diagnoses: Principal Problem:   (HFpEF) heart failure with preserved ejection fraction (HCC) Active Problems:   History of osteomyelitis   Hyperlipidemia   DEPRESSION, SITUATIONAL, ACUTE   Essential hypertension   Paraplegia (HCC)   Controlled type 2 diabetes mellitus with hyperglycemia, with long-term current use of insulin (HCC)   Transverse myelitis (HCC)   Stage IV pressure ulcer of sacral region (HCC)   Normocytic anemia   GERD (gastroesophageal reflux disease)  Resolved Problems:   * No resolved hospital problems. *  Hospital Course:  77 y.o. male with medical history significant of quadriparesis and quadriplegia due to transverse myelitis, GI fistula  s/p post wedge resection 9/24, neurogenic bladder s/p suprapubic catheter, stage IV decubitus ulcer, insulin-dependent DM type II, asthma in situ who presents with complaints of increased shortness of breath and cough over the last 2 days.  Chest x-ray noted enlarged pericardial silhouette with vascular congestion, increasing small pleural effusions, and adjacent opacities.  Review of records note patient had just recently been hospitalized 10/11-10/17 for evaluation for sacral ulcer and right foot wound which revealed right foot osteomyelitis.  Patient was seen by orthopedics and underwent a right fifth ray amputation.  ID have been consulted and recommended 6 weeks of IV Zosyn in the noted to be 08/14/23 for which PICC line was placed.  Patient was discharged to SNF.   In the [facility] x-ray noted enlarged pericardial silhouette with vascular congestion,  increasing small pleural effusions, and adjacent opacities  Assessment and Plan:  Acute on chronic diastolic heart failure -BNP 168 -Chest x-ray showed enlarged pericardial silhouette with vascular congestion -Started on Lasix 40 mg IV twice daily -Echocardiogram obtained today showed EF of 60 to 65%, mild LVH -Continue atenolol, Aldactone -Diuresed well, not requiring oxygen anymore -Will discharge on Lasix 40 mg p.o. daily   History of osteomyelitis of the right fifth metatarsal Septic arthritis of fifth MTP joint Patient s/p fifth ray amputation on 10/13 by orthopedics after MRI showed fifth MTP joint septic arthritis and osteomyelitis of the fifth metatarsal and fifth proximal phalanx.  OR cultures growing Providencia/Proteus/E faecalis/Bacteroides.   Scheduled to be on 6 weeks of IV Zosyn per ID until 08/15/2023.   Right upper extremity PICC in place. -Continue Zosyn till 08/15/2023   Diabetes mellitus type 2, with long-term use of insulin -Metformin  -Continue sliding scale insulin NovoLog -CBG well-controlled     Essential hypertension On admission blood pressures elevated up to 183/60. -Continue atenolol, losartan, and spironolactone   Anemia of chronic disease Hemoglobin 9.5 g/dL which appears slightly improved from prior hospitalization of 8.2 on 10/17.   Hyperlipidemia -Continue atorvastatin   Depression -Continue Cymbalta   Stage IV decubitus ulcer Ischial decubitus ulcer Patient has chronic ulcers.  Prior CT imaging showed stable right-sided ischial decubitus ulcer with no fluid collections from 10/11. -Low-air-loss mattress replacement -Continue prior wound care orders as seen below Dressing procedure/placement/frequency: Cleanse entire sacrum/buttocks with Vashe [name] [phone]) wound cleanser, apply Vashe moistened gauze to open areas sacrum, buttocks and R ischium. Cover R ischial wound with dry gauze and silicone foam.  Coat surrounding skin

## 2023-07-17 NOTE — TOC Transition Note (Signed)
Transition of Care [facility]) - CM/SW Discharge Note   Patient Details  Name: Brent [name]. MRN: [id number] Date of Birth: 1946-04-10  Transition of Care [facility]) CM/SW Contact:  Carley Hammed, LCSW Phone Number: 07/17/2023, 12:37 PM   Clinical Narrative:    Pt to be transported to [facility] via [facility]. Nurse to call report to [phone]. Rm# [id number].  Spouse with concerns about pt's DC, requested MD to call before continuing with DC.   Final next level of care: Skilled Nursing Facility Barriers to Discharge: Barriers Resolved   Patient Goals and CMS Choice      Discharge Placement                Patient chooses bed at: [facility] Patient to be transferred to facility by: [facility] Name of family member notified: Spouse Patient and family notified of of transfer: 07/17/23  Discharge Plan and Services Additional resources added to the After Visit Summary for                                       Social Determinants of Health (SDOH) Interventions SDOH Screenings   Food Insecurity: No Food Insecurity (07/14/2023)  Housing: Low Risk  (07/14/2023)  Transportation Needs: No Transportation Needs (07/14/2023)  Utilities: Not At Risk (07/14/2023)  Depression (PHQ2-9): Low Risk  (12/09/2020)  Financial Resource Strain: Low Risk  (03/06/2020)  Physical Activity: Inactive (12/14/2018)  Social Connections: Unknown (12/14/2018)  Stress: No Stress Concern Present (12/14/2018)  Tobacco Use: Medium Risk (07/14/2023)     Readmission Risk Interventions    07/02/2023   12:31 PM  Readmission Risk Prevention Plan  Transportation Screening Complete  Medication Review (RN Care Manager) Complete  PCP or Specialist appointment within 3-5 days of discharge Not Complete  PCP/Specialist Appt Not Complete comments SNF resident  [facility] or Home Care Consult Complete  SW Recovery Care/Counseling Consult Complete  Palliative Care Screening Not Applicable  Skilled Nursing Facility  Complete

## 2023-07-17 NOTE — Inpatient Diabetes Management (Signed)
Inpatient Diabetes Program Recommendations  AACE/ADA: New Consensus Statement on Inpatient Glycemic Control (2015)  Target Ranges:  Prepandial:   less than 140 mg/dL      Peak postprandial:   less than 180 mg/dL (1-2 hours)      Critically ill patients:  140 - 180 mg/dL   Lab Results  Component Value Date   GLUCAP 223 (H) 07/17/2023   HGBA1C 7.5 (H) 06/06/2023    Review of Glycemic Control  Latest Reference Range & Units 07/16/23 06:00 07/16/23 12:30 07/16/23 16:13 07/16/23 21:40 07/17/23 06:25  Glucose-Capillary 70 - 99 mg/dL 409 (H) 811 (H) 914 (H) 249 (H) 223 (H)  (H): Data is abnormally high Diabetes history: Type 2 DM Outpatient Diabetes medications: Novolog 0-15 units Q4H, Semglee 35 units BID, Metformin 1000 mg BID Current orders for Inpatient glycemic control: Semglee 20 units BID, Novolog 0-15 units TID  Inpatient Diabetes Program Recommendations:    Consider increasing Semglee to 24 units BID.  Thanks, Lujean Rave, MSN, RNC-OB Diabetes Coordinator [phone] (8a-5p)

## 2023-07-19 ENCOUNTER — Emergency Department (HOSPITAL_COMMUNITY): Payer: Medicare Other

## 2023-07-19 ENCOUNTER — Other Ambulatory Visit: Payer: Self-pay

## 2023-07-19 ENCOUNTER — Encounter (HOSPITAL_COMMUNITY): Payer: Self-pay | Admitting: Pharmacy Technician

## 2023-07-19 ENCOUNTER — Inpatient Hospital Stay (HOSPITAL_COMMUNITY)
Admission: EM | Admit: 2023-07-19 | Discharge: 2023-07-27 | DRG: 291 | Disposition: A | Discharge status: Skilled Nursing Facility | Payer: Medicare Other | Source: Skilled Nursing Facility | Attending: Internal Medicine | Admitting: Internal Medicine

## 2023-07-19 DIAGNOSIS — G8220 Paraplegia, unspecified: Secondary | ICD-10-CM | POA: Diagnosis present

## 2023-07-19 DIAGNOSIS — I110 Hypertensive heart disease with heart failure: Principal | ICD-10-CM | POA: Diagnosis present

## 2023-07-19 DIAGNOSIS — Z955 Presence of coronary angioplasty implant and graft: Secondary | ICD-10-CM

## 2023-07-19 DIAGNOSIS — Z7984 Long term (current) use of oral hypoglycemic drugs: Secondary | ICD-10-CM

## 2023-07-19 DIAGNOSIS — E781 Pure hyperglyceridemia: Secondary | ICD-10-CM | POA: Diagnosis present

## 2023-07-19 DIAGNOSIS — D638 Anemia in other chronic diseases classified elsewhere: Secondary | ICD-10-CM | POA: Diagnosis present

## 2023-07-19 DIAGNOSIS — I5033 Acute on chronic diastolic (congestive) heart failure: Secondary | ICD-10-CM | POA: Diagnosis present

## 2023-07-19 DIAGNOSIS — R001 Bradycardia, unspecified: Secondary | ICD-10-CM | POA: Diagnosis present

## 2023-07-19 DIAGNOSIS — N319 Neuromuscular dysfunction of bladder, unspecified: Secondary | ICD-10-CM | POA: Diagnosis present

## 2023-07-19 DIAGNOSIS — Z794 Long term (current) use of insulin: Secondary | ICD-10-CM

## 2023-07-19 DIAGNOSIS — R410 Disorientation, unspecified: Secondary | ICD-10-CM | POA: Diagnosis present

## 2023-07-19 DIAGNOSIS — F05 Delirium due to known physiological condition: Secondary | ICD-10-CM | POA: Diagnosis not present

## 2023-07-19 DIAGNOSIS — Z933 Colostomy status: Secondary | ICD-10-CM

## 2023-07-19 DIAGNOSIS — L89894 Pressure ulcer of other site, stage 4: Secondary | ICD-10-CM | POA: Diagnosis present

## 2023-07-19 DIAGNOSIS — L89154 Pressure ulcer of sacral region, stage 4: Secondary | ICD-10-CM | POA: Diagnosis present

## 2023-07-19 DIAGNOSIS — Z1152 Encounter for screening for COVID-19: Secondary | ICD-10-CM | POA: Diagnosis not present

## 2023-07-19 DIAGNOSIS — Z888 Allergy status to other drugs, medicaments and biological substances status: Secondary | ICD-10-CM

## 2023-07-19 DIAGNOSIS — E1169 Type 2 diabetes mellitus with other specified complication: Secondary | ICD-10-CM | POA: Diagnosis present

## 2023-07-19 DIAGNOSIS — K316 Fistula of stomach and duodenum: Secondary | ICD-10-CM | POA: Diagnosis present

## 2023-07-19 DIAGNOSIS — Z9049 Acquired absence of other specified parts of digestive tract: Secondary | ICD-10-CM

## 2023-07-19 DIAGNOSIS — L97519 Non-pressure chronic ulcer of other part of right foot with unspecified severity: Secondary | ICD-10-CM | POA: Diagnosis present

## 2023-07-19 DIAGNOSIS — Z960 Presence of urogenital implants: Secondary | ICD-10-CM | POA: Diagnosis present

## 2023-07-19 DIAGNOSIS — G4733 Obstructive sleep apnea (adult) (pediatric): Secondary | ICD-10-CM | POA: Diagnosis present

## 2023-07-19 DIAGNOSIS — Z87891 Personal history of nicotine dependence: Secondary | ICD-10-CM

## 2023-07-19 DIAGNOSIS — Z8739 Personal history of other diseases of the musculoskeletal system and connective tissue: Secondary | ICD-10-CM

## 2023-07-19 DIAGNOSIS — D649 Anemia, unspecified: Secondary | ICD-10-CM | POA: Diagnosis not present

## 2023-07-19 DIAGNOSIS — R031 Nonspecific low blood-pressure reading: Secondary | ICD-10-CM | POA: Diagnosis present

## 2023-07-19 DIAGNOSIS — I471 Supraventricular tachycardia, unspecified: Secondary | ICD-10-CM | POA: Diagnosis not present

## 2023-07-19 DIAGNOSIS — I5030 Unspecified diastolic (congestive) heart failure: Secondary | ICD-10-CM | POA: Diagnosis present

## 2023-07-19 DIAGNOSIS — F4321 Adjustment disorder with depressed mood: Secondary | ICD-10-CM | POA: Diagnosis present

## 2023-07-19 DIAGNOSIS — E1165 Type 2 diabetes mellitus with hyperglycemia: Secondary | ICD-10-CM | POA: Diagnosis present

## 2023-07-19 DIAGNOSIS — J90 Pleural effusion, not elsewhere classified: Secondary | ICD-10-CM | POA: Diagnosis not present

## 2023-07-19 DIAGNOSIS — I2510 Atherosclerotic heart disease of native coronary artery without angina pectoris: Secondary | ICD-10-CM | POA: Diagnosis present

## 2023-07-19 DIAGNOSIS — R54 Age-related physical debility: Secondary | ICD-10-CM | POA: Diagnosis present

## 2023-07-19 DIAGNOSIS — F341 Dysthymic disorder: Secondary | ICD-10-CM | POA: Diagnosis present

## 2023-07-19 DIAGNOSIS — Z801 Family history of malignant neoplasm of trachea, bronchus and lung: Secondary | ICD-10-CM

## 2023-07-19 DIAGNOSIS — G373 Acute transverse myelitis in demyelinating disease of central nervous system: Secondary | ICD-10-CM | POA: Diagnosis present

## 2023-07-19 DIAGNOSIS — I10 Essential (primary) hypertension: Secondary | ICD-10-CM | POA: Diagnosis not present

## 2023-07-19 DIAGNOSIS — E785 Hyperlipidemia, unspecified: Secondary | ICD-10-CM | POA: Diagnosis present

## 2023-07-19 DIAGNOSIS — M009 Pyogenic arthritis, unspecified: Secondary | ICD-10-CM | POA: Diagnosis present

## 2023-07-19 DIAGNOSIS — D72829 Elevated white blood cell count, unspecified: Secondary | ICD-10-CM | POA: Diagnosis not present

## 2023-07-19 DIAGNOSIS — Z515 Encounter for palliative care: Secondary | ICD-10-CM | POA: Diagnosis not present

## 2023-07-19 DIAGNOSIS — Z7189 Other specified counseling: Secondary | ICD-10-CM | POA: Diagnosis not present

## 2023-07-19 DIAGNOSIS — M6250 Muscle wasting and atrophy, not elsewhere classified, unspecified site: Secondary | ICD-10-CM | POA: Diagnosis present

## 2023-07-19 DIAGNOSIS — J9811 Atelectasis: Secondary | ICD-10-CM | POA: Diagnosis present

## 2023-07-19 DIAGNOSIS — M86171 Other acute osteomyelitis, right ankle and foot: Secondary | ICD-10-CM | POA: Diagnosis present

## 2023-07-19 DIAGNOSIS — J9600 Acute respiratory failure, unspecified whether with hypoxia or hypercapnia: Secondary | ICD-10-CM | POA: Diagnosis present

## 2023-07-19 DIAGNOSIS — E11621 Type 2 diabetes mellitus with foot ulcer: Secondary | ICD-10-CM | POA: Diagnosis present

## 2023-07-19 DIAGNOSIS — Z89421 Acquired absence of other right toe(s): Secondary | ICD-10-CM

## 2023-07-19 DIAGNOSIS — K219 Gastro-esophageal reflux disease without esophagitis: Secondary | ICD-10-CM | POA: Diagnosis not present

## 2023-07-19 DIAGNOSIS — Z79899 Other long term (current) drug therapy: Secondary | ICD-10-CM

## 2023-07-19 DIAGNOSIS — E782 Mixed hyperlipidemia: Secondary | ICD-10-CM | POA: Diagnosis not present

## 2023-07-19 DIAGNOSIS — Z8674 Personal history of sudden cardiac arrest: Secondary | ICD-10-CM

## 2023-07-19 DIAGNOSIS — J45909 Unspecified asthma, uncomplicated: Secondary | ICD-10-CM | POA: Diagnosis present

## 2023-07-19 DIAGNOSIS — Z860100 Personal history of colon polyps, unspecified: Secondary | ICD-10-CM

## 2023-07-19 DIAGNOSIS — Z85820 Personal history of malignant melanoma of skin: Secondary | ICD-10-CM

## 2023-07-19 DIAGNOSIS — M542 Cervicalgia: Secondary | ICD-10-CM | POA: Diagnosis present

## 2023-07-19 DIAGNOSIS — R680 Hypothermia, not associated with low environmental temperature: Secondary | ICD-10-CM | POA: Diagnosis present

## 2023-07-19 DIAGNOSIS — I252 Old myocardial infarction: Secondary | ICD-10-CM

## 2023-07-19 DIAGNOSIS — Z800 Family history of malignant neoplasm of digestive organs: Secondary | ICD-10-CM

## 2023-07-19 DIAGNOSIS — Z881 Allergy status to other antibiotic agents status: Secondary | ICD-10-CM

## 2023-07-19 DIAGNOSIS — Z825 Family history of asthma and other chronic lower respiratory diseases: Secondary | ICD-10-CM

## 2023-07-19 DIAGNOSIS — Z8249 Family history of ischemic heart disease and other diseases of the circulatory system: Secondary | ICD-10-CM

## 2023-07-19 LAB — RESP PANEL BY RT-PCR (RSV, FLU A&B, COVID)  RVPGX2
Influenza A by PCR: NEGATIVE
Influenza B by PCR: NEGATIVE
Resp Syncytial Virus by PCR: NEGATIVE
SARS Coronavirus 2 by RT PCR: NEGATIVE

## 2023-07-19 LAB — COMPREHENSIVE METABOLIC PANEL
ALT: 16 U/L (ref 0–44)
AST: 19 U/L (ref 15–41)
Albumin: 2.3 g/dL — ABNORMAL LOW (ref 3.5–5.0)
Alkaline Phosphatase: 67 U/L (ref 38–126)
Anion gap: 12 (ref 5–15)
BUN: 9 mg/dL (ref 8–23)
CO2: 26 mmol/L (ref 22–32)
Calcium: 8.6 mg/dL — ABNORMAL LOW (ref 8.9–10.3)
Chloride: 102 mmol/L (ref 98–111)
Creatinine, Ser: 0.6 mg/dL — ABNORMAL LOW (ref 0.61–1.24)
GFR, Estimated: >60 mL/min (ref >60)
Glucose, Bld: 142 mg/dL — ABNORMAL HIGH (ref 70–99)
Potassium: 3.6 mmol/L (ref 3.5–5.1)
Sodium: 140 mmol/L (ref 135–145)
Total Bilirubin: 0.4 mg/dL (ref 0.3–1.2)
Total Protein: 5.5 g/dL — ABNORMAL LOW (ref 6.5–8.1)

## 2023-07-19 LAB — I-STAT VENOUS BLOOD GAS, ED
Acid-Base Excess: 5 mmol/L — ABNORMAL HIGH (ref 0.0–2.0)
Bicarbonate: 28.9 mmol/L — ABNORMAL HIGH (ref 20.0–28.0)
Calcium, Ion: 1.08 mmol/L — ABNORMAL LOW (ref 1.15–1.40)
HCT: 30 % — ABNORMAL LOW (ref 39.0–52.0)
Hemoglobin: 10.2 g/dL — ABNORMAL LOW (ref 13.0–17.0)
O2 Saturation: 88 %
Potassium: 3.6 mmol/L (ref 3.5–5.1)
Sodium: 140 mmol/L (ref 135–145)
TCO2: 30 mmol/L (ref 22–32)
pCO2, Ven: 40.7 mm[Hg] — ABNORMAL LOW (ref 44–60)
pH, Ven: 7.459 — ABNORMAL HIGH (ref 7.25–7.43)
pO2, Ven: 52 mm[Hg] — ABNORMAL HIGH (ref 32–45)

## 2023-07-19 LAB — AMMONIA: Ammonia: 16 umol/L (ref 9–35)

## 2023-07-19 LAB — CBC WITH DIFFERENTIAL/PLATELET
Abs Immature Granulocytes: 0.1 10*3/uL — ABNORMAL HIGH (ref 0.00–0.07)
Basophils Absolute: 0.1 10*3/uL (ref 0.0–0.1)
Basophils Relative: 1 %
Eosinophils Absolute: 0.2 10*3/uL (ref 0.0–0.5)
Eosinophils Relative: 1 %
HCT: 32 % — ABNORMAL LOW (ref 39.0–52.0)
Hemoglobin: 9.4 g/dL — ABNORMAL LOW (ref 13.0–17.0)
Immature Granulocytes: 1 %
Lymphocytes Relative: 10 %
Lymphs Abs: 1.2 10*3/uL (ref 0.7–4.0)
MCH: 24.9 pg — ABNORMAL LOW (ref 26.0–34.0)
MCHC: 29.4 g/dL — ABNORMAL LOW (ref 30.0–36.0)
MCV: 84.9 fL (ref 80.0–100.0)
Monocytes Absolute: 0.7 10*3/uL (ref 0.1–1.0)
Monocytes Relative: 6 %
Neutro Abs: 9.5 10*3/uL — ABNORMAL HIGH (ref 1.7–7.7)
Neutrophils Relative %: 81 %
Platelets: 375 10*3/uL (ref 150–400)
RBC: 3.77 MIL/uL — ABNORMAL LOW (ref 4.22–5.81)
RDW: 16.9 % — ABNORMAL HIGH (ref 11.5–15.5)
WBC: 11.8 10*3/uL — ABNORMAL HIGH (ref 4.0–10.5)
nRBC: 0 % (ref 0.0–0.2)

## 2023-07-19 LAB — URINALYSIS, W/ REFLEX TO CULTURE (INFECTION SUSPECTED)
Bacteria, UA: NONE SEEN
Bilirubin Urine: NEGATIVE
Glucose, UA: NEGATIVE mg/dL
Hgb urine dipstick: NEGATIVE
Ketones, ur: 5 mg/dL — AB
Leukocytes,Ua: NEGATIVE
Nitrite: NEGATIVE
Protein, ur: 30 mg/dL — AB
Specific Gravity, Urine: >1.046 — ABNORMAL HIGH (ref 1.005–1.030)
pH: 8 (ref 5.0–8.0)

## 2023-07-19 LAB — PROTIME-INR
INR: 1.1 (ref 0.8–1.2)
Prothrombin Time: 14.6 s (ref 11.4–15.2)

## 2023-07-19 LAB — I-STAT CHEM 8, ED
BUN: 8 mg/dL (ref 8–23)
Calcium, Ion: 1.09 mmol/L — ABNORMAL LOW (ref 1.15–1.40)
Chloride: 100 mmol/L (ref 98–111)
Creatinine, Ser: 0.5 mg/dL — ABNORMAL LOW (ref 0.61–1.24)
Glucose, Bld: 141 mg/dL — ABNORMAL HIGH (ref 70–99)
HCT: 29 % — ABNORMAL LOW (ref 39.0–52.0)
Hemoglobin: 9.9 g/dL — ABNORMAL LOW (ref 13.0–17.0)
Potassium: 3.6 mmol/L (ref 3.5–5.1)
Sodium: 140 mmol/L (ref 135–145)
TCO2: 27 mmol/L (ref 22–32)

## 2023-07-19 LAB — BRAIN NATRIURETIC PEPTIDE: B Natriuretic Peptide: 136.4 pg/mL — ABNORMAL HIGH (ref 0.0–100.0)

## 2023-07-19 LAB — TROPONIN I (HIGH SENSITIVITY)
Troponin I (High Sensitivity): 4 ng/L (ref <18)
Troponin I (High Sensitivity): 4 ng/L (ref <18)

## 2023-07-19 LAB — GLUCOSE, CAPILLARY
Glucose-Capillary: 134 mg/dL — ABNORMAL HIGH (ref 70–99)
Glucose-Capillary: 241 mg/dL — ABNORMAL HIGH (ref 70–99)

## 2023-07-19 LAB — I-STAT CG4 LACTIC ACID, ED: Lactic Acid, Venous: 1.7 mmol/L (ref 0.5–1.9)

## 2023-07-19 LAB — PROCALCITONIN: Procalcitonin: <0.1 ng/mL

## 2023-07-19 MED ORDER — INSULIN ASPART 100 UNIT/ML IJ SOLN
0.0000 [IU] | Freq: Three times a day (TID) | INTRAMUSCULAR | Status: DC
  Administered 2023-07-20: 2 [IU] via SUBCUTANEOUS
  Administered 2023-07-20: 3 [IU] via SUBCUTANEOUS
  Administered 2023-07-21 (×2): 2 [IU] via SUBCUTANEOUS
  Administered 2023-07-21 – 2023-07-22 (×2): 3 [IU] via SUBCUTANEOUS
  Administered 2023-07-22: 5 [IU] via SUBCUTANEOUS
  Administered 2023-07-22: 2 [IU] via SUBCUTANEOUS
  Administered 2023-07-23 – 2023-07-24 (×3): 3 [IU] via SUBCUTANEOUS
  Administered 2023-07-25: 2 [IU] via SUBCUTANEOUS
  Administered 2023-07-25: 5 [IU] via SUBCUTANEOUS
  Administered 2023-07-25: 3 [IU] via SUBCUTANEOUS

## 2023-07-19 MED ORDER — SPIRONOLACTONE 25 MG PO TABS
25.0000 mg | ORAL_TABLET | Freq: Every day | ORAL | Status: DC
  Administered 2023-07-20: 25 mg via ORAL
  Filled 2023-07-19 (×2): qty 1

## 2023-07-19 MED ORDER — IOHEXOL 350 MG/ML SOLN: 75.0000 mL | Freq: Once | INTRAVENOUS | Status: DC | PRN

## 2023-07-19 MED ORDER — ACETAMINOPHEN 325 MG PO TABS
650.0000 mg | ORAL_TABLET | Freq: Four times a day (QID) | ORAL | Status: DC | PRN
  Administered 2023-07-22: 650 mg via ORAL
  Filled 2023-07-19: qty 2

## 2023-07-19 MED ORDER — IOHEXOL 350 MG/ML SOLN
75.0000 mL | Freq: Once | INTRAVENOUS | Status: AC | PRN
Start: 2023-07-19 — End: 2023-07-19
  Administered 2023-07-19: 75 mL via INTRAVENOUS

## 2023-07-19 MED ORDER — SPIRONOLACTONE 25 MG PO TABS: 25.0000 mg | ORAL_TABLET | Freq: Every day | ORAL | Status: DC

## 2023-07-19 MED ORDER — ONDANSETRON HCL 4 MG/2ML IJ SOLN
4.0000 mg | Freq: Once | INTRAMUSCULAR | Status: AC
  Administered 2023-07-19: 4 mg via INTRAVENOUS
  Filled 2023-07-19: qty 2

## 2023-07-19 MED ORDER — OXYCODONE HCL 5 MG PO TABS
5.0000 mg | ORAL_TABLET | Freq: Two times a day (BID) | ORAL | Status: DC | PRN
  Administered 2023-07-19 – 2023-07-25 (×5): 5 mg via ORAL
  Filled 2023-07-19 (×5): qty 1

## 2023-07-19 MED ORDER — VITAMIN C 500 MG PO TABS
500.0000 mg | ORAL_TABLET | Freq: Every day | ORAL | Status: DC
  Administered 2023-07-19 – 2023-07-27 (×9): 500 mg via ORAL
  Filled 2023-07-19 (×9): qty 1

## 2023-07-19 MED ORDER — POLYETHYL GLYCOL-PROPYL GLYCOL 0.4-0.3 % OP GEL: 1.0000 | Freq: Four times a day (QID) | OPHTHALMIC | Status: DC | PRN

## 2023-07-19 MED ORDER — CHLORHEXIDINE GLUCONATE CLOTH 2 % EX PADS
6.0000 | MEDICATED_PAD | Freq: Every day | CUTANEOUS | Status: DC
  Administered 2023-07-19 – 2023-07-27 (×9): 6 via TOPICAL

## 2023-07-19 MED ORDER — ALUM & MAG HYDROXIDE-SIMETH 200-200-20 MG/5ML PO SUSP
30.0000 mL | Freq: Four times a day (QID) | ORAL | Status: DC | PRN
  Administered 2023-07-19: 30 mL via ORAL
  Filled 2023-07-19: qty 30

## 2023-07-19 MED ORDER — TRAZODONE HCL 100 MG PO TABS
100.0000 mg | ORAL_TABLET | Freq: Every day | ORAL | Status: DC
  Administered 2023-07-19 – 2023-07-26 (×8): 100 mg via ORAL
  Filled 2023-07-19 (×8): qty 1

## 2023-07-19 MED ORDER — ALTEPLASE 2 MG IJ SOLR
2.0000 mg | Freq: Once | INTRAMUSCULAR | Status: AC
  Administered 2023-07-19: 2 mg
  Filled 2023-07-19: qty 2

## 2023-07-19 MED ORDER — ZINC SULFATE 220 (50 ZN) MG PO CAPS
220.0000 mg | ORAL_CAPSULE | Freq: Every day | ORAL | Status: DC
Start: 2023-07-19 — End: 2023-07-27
  Administered 2023-07-19 – 2023-07-27 (×9): 220 mg via ORAL
  Filled 2023-07-19 (×9): qty 1

## 2023-07-19 MED ORDER — NYSTATIN 100000 UNIT/GM EX POWD
1.0000 | Freq: Every day | CUTANEOUS | Status: DC
  Administered 2023-07-19 – 2023-07-27 (×9): 1 via TOPICAL
  Filled 2023-07-19: qty 15

## 2023-07-19 MED ORDER — GUAIFENESIN ER 600 MG PO TB12
600.0000 mg | ORAL_TABLET | Freq: Two times a day (BID) | ORAL | Status: DC
  Administered 2023-07-19 – 2023-07-27 (×16): 600 mg via ORAL
  Filled 2023-07-19 (×16): qty 1

## 2023-07-19 MED ORDER — PANTOPRAZOLE SODIUM 40 MG PO TBEC
40.0000 mg | DELAYED_RELEASE_TABLET | Freq: Every day | ORAL | Status: DC
  Administered 2023-07-19 – 2023-07-27 (×9): 40 mg via ORAL
  Filled 2023-07-19 (×9): qty 1

## 2023-07-19 MED ORDER — ATORVASTATIN CALCIUM 40 MG PO TABS
40.0000 mg | ORAL_TABLET | Freq: Every day | ORAL | Status: DC
  Administered 2023-07-19 – 2023-07-26 (×8): 40 mg via ORAL
  Filled 2023-07-19 (×8): qty 1

## 2023-07-19 MED ORDER — ORAL CARE MOUTH RINSE: 15.0000 mL | OROMUCOSAL | Status: DC | PRN

## 2023-07-19 MED ORDER — LORAZEPAM 0.5 MG PO TABS
0.5000 mg | ORAL_TABLET | Freq: Every day | ORAL | Status: DC | PRN
Start: 2023-07-19 — End: 2023-07-27
  Administered 2023-07-20 – 2023-07-25 (×3): 0.5 mg via ORAL
  Filled 2023-07-19 (×4): qty 1

## 2023-07-19 MED ORDER — LAMOTRIGINE 100 MG PO TABS
50.0000 mg | ORAL_TABLET | Freq: Two times a day (BID) | ORAL | Status: DC
  Administered 2023-07-19 – 2023-07-27 (×16): 50 mg via ORAL
  Filled 2023-07-19 (×16): qty 1

## 2023-07-19 MED ORDER — POLYVINYL ALCOHOL 1.4 % OP SOLN: 1.0000 [drp] | OPHTHALMIC | Status: DC | PRN

## 2023-07-19 MED ORDER — FUROSEMIDE 10 MG/ML IJ SOLN
40.0000 mg | Freq: Two times a day (BID) | INTRAMUSCULAR | Status: DC
  Administered 2023-07-19 – 2023-07-25 (×13): 40 mg via INTRAVENOUS
  Filled 2023-07-19 (×13): qty 4

## 2023-07-19 MED ORDER — PIPERACILLIN-TAZOBACTAM IV (FOR PTA / DISCHARGE USE ONLY): 3.3750 g | Freq: Four times a day (QID) | INTRAVENOUS | Status: DC

## 2023-07-19 MED ORDER — INSULIN GLARGINE-YFGN 100 UNIT/ML ~~LOC~~ SOLN
24.0000 [IU] | Freq: Two times a day (BID) | SUBCUTANEOUS | Status: DC
  Administered 2023-07-19 – 2023-07-27 (×16): 24 [IU] via SUBCUTANEOUS
  Filled 2023-07-19 (×17): qty 0.24

## 2023-07-19 MED ORDER — LOSARTAN POTASSIUM 50 MG PO TABS
75.0000 mg | ORAL_TABLET | Freq: Every day | ORAL | Status: DC
  Administered 2023-07-20: 75 mg via ORAL
  Filled 2023-07-19: qty 1

## 2023-07-19 MED ORDER — ALBUTEROL SULFATE (2.5 MG/3ML) 0.083% IN NEBU: 2.5000 mg | INHALATION_SOLUTION | Freq: Four times a day (QID) | RESPIRATORY_TRACT | Status: DC | PRN

## 2023-07-19 MED ORDER — PIPERACILLIN-TAZOBACTAM 3.375 G IVPB 30 MIN
3.3750 g | Freq: Four times a day (QID) | INTRAVENOUS | Status: DC
  Administered 2023-07-19 – 2023-07-21 (×9): 3.375 g via INTRAVENOUS
  Filled 2023-07-19 (×11): qty 50

## 2023-07-19 MED ORDER — SENNOSIDES-DOCUSATE SODIUM 8.6-50 MG PO TABS
1.0000 | ORAL_TABLET | Freq: Two times a day (BID) | ORAL | Status: DC
Start: 2023-07-19 — End: 2023-07-27
  Administered 2023-07-19 – 2023-07-27 (×14): 1 via ORAL
  Filled 2023-07-19 (×14): qty 1

## 2023-07-19 MED ORDER — FUROSEMIDE 10 MG/ML IJ SOLN
40.0000 mg | Freq: Once | INTRAMUSCULAR | Status: AC
  Administered 2023-07-19: 40 mg via INTRAVENOUS
  Filled 2023-07-19: qty 4

## 2023-07-19 MED ORDER — ENOXAPARIN SODIUM 40 MG/0.4ML IJ SOSY
40.0000 mg | PREFILLED_SYRINGE | INTRAMUSCULAR | Status: DC
  Administered 2023-07-19 – 2023-07-26 (×8): 40 mg via SUBCUTANEOUS
  Filled 2023-07-19 (×8): qty 0.4

## 2023-07-19 MED ORDER — POTASSIUM CHLORIDE CRYS ER 20 MEQ PO TBCR
20.0000 meq | EXTENDED_RELEASE_TABLET | Freq: Every day | ORAL | Status: DC
  Administered 2023-07-19 – 2023-07-27 (×9): 20 meq via ORAL
  Filled 2023-07-19 (×9): qty 1

## 2023-07-19 MED ORDER — ACETAMINOPHEN 650 MG RE SUPP
650.0000 mg | Freq: Four times a day (QID) | RECTAL | Status: DC | PRN
Start: 2023-07-19 — End: 2023-07-27

## 2023-07-19 MED ORDER — BACLOFEN 10 MG PO TABS
20.0000 mg | ORAL_TABLET | Freq: Three times a day (TID) | ORAL | Status: DC
  Administered 2023-07-19 – 2023-07-27 (×22): 20 mg via ORAL
  Filled 2023-07-19 (×25): qty 2

## 2023-07-19 MED ORDER — DULOXETINE HCL 30 MG PO CPEP
30.0000 mg | ORAL_CAPSULE | Freq: Every day | ORAL | Status: DC
  Administered 2023-07-19 – 2023-07-26 (×8): 30 mg via ORAL
  Filled 2023-07-19 (×8): qty 1

## 2023-07-19 NOTE — ED Provider Notes (Signed)
[place] EMERGENCY DEPARTMENT AT [facility] Provider Note   CSN: [id number] Arrival date & time: 07/19/23  6644     History  Chief Complaint  Patient presents with   Weakness    Brent [name]. is a 77 y.o. male.  Level 5 caveat.  History limited due to patient being uncomfortable.  He just tells me that he has pain everywhere.  Pain in his head and neck and throughout.  He feels nauseous. Has suprapucib catheter and ostomy. Hx of paraplegia, CAD,recent right foot infection and surgery, sacral ulcers. Overall, difficult to obtain a hx.   The history is provided by the patient and the EMS personnel.       Home Medications Prior to Admission medications   Medication Sig Start Date End Date Taking? Authorizing Provider  acetaminophen (TYLENOL) 500 MG tablet Take 2 tablets (1,000 mg total) by mouth every 6 (six) hours as needed for mild pain (or Fever >/= 101). 05/31/23  Yes Rhetta Mura, MD  atenolol (TENORMIN) 25 MG tablet Take 25 mg by mouth at bedtime.   Yes [provider]  atenolol (TENORMIN) 25 MG tablet Take 12.5 mg by mouth in the morning.   Yes [provider]  atorvastatin (LIPITOR) 40 MG tablet Take 1 tablet (40 mg total) by mouth at bedtime. 05/31/23  Yes Rhetta Mura, MD  baclofen (LIORESAL) 20 MG tablet Take 1 tablet (20 mg total) by mouth 3 (three) times daily. 06/06/23  Yes Amin, Ankit C, MD  benzonatate (TESSALON) 100 MG capsule Take 100 mg by mouth every 8 (eight) hours. 07/13/23 07/20/23 Yes [provider]  cholecalciferol (VITAMIN D3) 25 MCG (1000 UNIT) tablet Take 2,000 Units by mouth daily.   Yes [provider]  DULoxetine (CYMBALTA) 30 MG capsule Take 1 capsule (30 mg total) by mouth at bedtime. 05/31/23  Yes Rhetta Mura, MD  famotidine (PEPCID) 20 MG tablet Take 20 mg by mouth 2 (two) times daily.   Yes [provider]  furosemide (LASIX) 40 MG tablet Take 1 tablet (40 mg  total) by mouth daily. 07/18/23  Yes Meredeth Ide, MD  guaiFENesin (MUCINEX) 600 MG 12 hr tablet Take 600 mg by mouth 2 (two) times daily.   Yes [provider]  guaiFENesin-dextromethorphan (ROBITUSSIN DM) 100-10 MG/5ML syrup Take 10 mLs by mouth every 6 (six) hours as needed for cough.   Yes [provider]  insulin aspart (NOVOLOG) 100 UNIT/ML injection Inject 0-15 Units into the skin every 4 (four) hours. Sliding scale  CBG 70 - 120: 0 units: CBG 121 - 150: 2 units; CBG 151 - 200: 3 units; CBG 201 - 250: 5 units; CBG 251 - 300: 8 units;CBG 301 - 350: 11 units; CBG 351 - 400: 15 units; CBG > 400 : 15 units and notify MD Patient taking differently: Inject 0-15 Units into the skin See admin instructions. Inject 0-15 units before meals per sliding scale: 70-120 : 0 units 121-150 : 2 units 151-200 : 3 units 201-250 : 5 units 251-300 : 8 units 301-350 : 11 units 351-400 : 15 units > 400 : 15 units, notify MD 06/10/20  Yes Rai, Ripudeep K, MD  insulin glargine-yfgn (SEMGLEE, YFGN,) 100 UNIT/ML Pen Inject 35 Units into the skin 2 (two) times daily.   Yes [provider]  lamoTRIgine (LAMICTAL) 25 MG tablet Take 2 tablets (50 mg total) by mouth 2 (two) times daily. For 5 days. Started 02/18/23. 07/06/23  Yes Alekh,  Kshitiz, MD  LORazepam (ATIVAN) 0.5 MG tablet Take 1 tablet (0.5 mg total) by mouth daily as needed (panic attacks). 07/06/23  Yes Glade Lloyd, MD  losartan (COZAAR) 50 MG tablet Take 75 mg by mouth daily.   Yes [provider]  Menthol, Topical Analgesic, (BIOFREEZE) 4 % GEL Apply 1 application  topically 2 (two) times daily as needed (pain to back (upper and lower),arms and shoulders).   Yes [provider]  metFORMIN (GLUCOPHAGE) 1000 MG tablet Take 1,000 mg by mouth in the morning and at bedtime.   Yes [provider]  nystatin powder Apply 1 Application topically daily. Apply to peri-wound bed, sacrum and right ischium daily with  routine dressing.   Yes [provider]  omeprazole (PRILOSEC) 40 MG capsule Take 1 capsule (40 mg total) by mouth 2 (two) times daily. 30 minutes before breakfast/dinner 10/13/22  Yes Mansouraty, Netty Starring., MD  ondansetron (ZOFRAN-ODT) 4 MG disintegrating tablet Take 4 mg by mouth every 8 (eight) hours as needed for nausea or vomiting.   Yes [provider]  oxyCODONE (OXY IR/ROXICODONE) 5 MG immediate release tablet Take 1 tablet (5 mg total) by mouth 2 (two) times daily as needed for breakthrough pain. 07/17/23  Yes Lama, Sarina Ill, MD  piperacillin-tazobactam (ZOSYN) IVPB Inject 3.375 g into the vein every 6 (six) hours. Indication: diabetic foot infection/osteomyelitis  First Dose: Yes Last Day of Therapy: 08/15/2023 Labs - Once weekly:  CBC/D and BMP, Labs - Once weekly: ESR and CRP 07/06/23 08/15/23 Yes Alekh, Kshitiz, MD  Polyethyl Glycol-Propyl Glycol (GENTEAL TEARS SEVERE DAY/NIGHT) 0.4-0.3 % GEL ophthalmic gel Place 1 Application into both eyes 4 (four) times daily as needed (dry eyes).   Yes [provider]  polyethylene glycol (MIRALAX / GLYCOLAX) 17 g packet Take 17 g by mouth daily as needed for mild constipation. 07/06/23  Yes Glade Lloyd, MD  potassium chloride SA (KLOR-CON M) 20 MEQ tablet Take 1 tablet (20 mEq total) by mouth daily. Take along with Lasix 07/17/23  Yes Lama, Sarina Ill, MD  riTUXimab (RITUXAN IV) Inject 10 mg into the vein once. Between the 12th-13th of June, December.   Yes [provider]  senna-docusate (SENOKOT-S) 8.6-50 MG tablet Take 1 tablet by mouth 2 (two) times daily.   Yes [provider]  spironolactone (ALDACTONE) 25 MG tablet Take 25 mg by mouth daily.   Yes [provider]  traZODone (DESYREL) 100 MG tablet Take 100 mg by mouth at bedtime.   Yes [provider]  vitamin C (ASCORBIC ACID) 500 MG tablet Take 500 mg by mouth daily.   Yes [provider]  zinc sulfate 220 (50 Zn) MG  capsule Take 1 capsule (220 mg total) by mouth daily. 07/07/23  Yes Glade Lloyd, MD      Allergies    Cipro [ciprofloxacin hcl], Neurontin [gabapentin], and Levaquin [levofloxacin]    Review of Systems   Review of Systems  Physical Exam Updated Vital Signs BP (!) 162/51   Pulse (!) 51   Temp (!) 94.3 F (34.6 C) (Oral)   Resp 17   SpO2 96%  Physical Exam Vitals and nursing note reviewed.  Constitutional:      General: He is in acute distress.     Appearance: He is well-developed. He is ill-appearing.  HENT:     Head: Normocephalic and atraumatic.     Nose: Nose normal.     Mouth/Throat:     Mouth: Mucous membranes are  moist.  Eyes:     Extraocular Movements: Extraocular movements intact.     Conjunctiva/sclera: Conjunctivae normal.     Pupils: Pupils are equal, round, and reactive to light.     Comments: pale  Cardiovascular:     Rate and Rhythm: Normal rate and regular rhythm.     Pulses: Normal pulses.     Heart sounds: Normal heart sounds. No murmur heard. Pulmonary:     Effort: Pulmonary effort is normal. No respiratory distress.     Breath sounds: Normal breath sounds.  Abdominal:     General: Abdomen is flat.     Palpations: Abdomen is soft.     Tenderness: There is no abdominal tenderness.  Musculoskeletal:        General: No swelling.     Cervical back: Normal range of motion and neck supple.  Skin:    General: Skin is warm and dry.     Capillary Refill: Capillary refill takes less than 2 seconds.     Comments: Sacral ulcers appear clean dry and intact  Neurological:     General: No focal deficit present.     Mental Status: He is alert.     Comments: Hard of hearing, answers questions when heavily probed but not well  Psychiatric:        Mood and Affect: Mood normal.     ED Results / Procedures / Treatments   Labs (all labs ordered are listed, but only abnormal results are displayed) Labs Reviewed  COMPREHENSIVE METABOLIC PANEL - Abnormal;  Notable for the following components:      Result Value   Glucose, Bld 142 (*)    Creatinine, Ser 0.60 (*)    Calcium 8.6 (*)    Total Protein 5.5 (*)    Albumin 2.3 (*)    All other components within normal limits  CBC WITH DIFFERENTIAL/PLATELET - Abnormal; Notable for the following components:   WBC 11.8 (*)    RBC 3.77 (*)    Hemoglobin 9.4 (*)    HCT 32.0 (*)    MCH 24.9 (*)    MCHC 29.4 (*)    RDW 16.9 (*)    Neutro Abs 9.5 (*)    Abs Immature Granulocytes 0.10 (*)    All other components within normal limits  URINALYSIS, W/ REFLEX TO CULTURE (INFECTION SUSPECTED) - Abnormal; Notable for the following components:   Specific Gravity, Urine >1.046 (*)    Ketones, ur 5 (*)    Protein, ur 30 (*)    All other components within normal limits  BRAIN NATRIURETIC PEPTIDE - Abnormal; Notable for the following components:   B Natriuretic Peptide 136.4 (*)    All other components within normal limits  I-STAT CHEM 8, ED - Abnormal; Notable for the following components:   Creatinine, Ser 0.50 (*)    Glucose, Bld 141 (*)    Calcium, Ion 1.09 (*)    Hemoglobin 9.9 (*)    HCT 29.0 (*)    All other components within normal limits  I-STAT VENOUS BLOOD GAS, ED - Abnormal; Notable for the following components:   pH, Ven 7.459 (*)    pCO2, Ven 40.7 (*)    pO2, Ven 52 (*)    Bicarbonate 28.9 (*)    Acid-Base Excess 5.0 (*)    Calcium, Ion 1.08 (*)    HCT 30.0 (*)    Hemoglobin 10.2 (*)    All other components within normal limits  RESP PANEL BY RT-PCR (RSV, FLU  A&B, COVID)  [id number]  CULTURE, BLOOD (ROUTINE X 2)  CULTURE, BLOOD (ROUTINE X 2)  AMMONIA  PROTIME-INR  I-STAT CG4 LACTIC ACID, ED  TROPONIN I (HIGH SENSITIVITY)  TROPONIN I (HIGH SENSITIVITY)    EKG EKG Interpretation Date/Time:  Wednesday July 19 2023 09:00:58 EDT Ventricular Rate:  53 PR Interval:    QRS Duration:  107 QT Interval:  526 QTC Calculation: 494 R Axis:   7  Text Interpretation: sinus brady,  artifact Borderline T wave abnormalities Borderline prolonged QT interval Confirmed by Virgina Norfolk [phone]) on 07/19/2023 9:18:20 AM  Radiology CT ABDOMEN PELVIS W CONTRAST  Result Date: 07/19/2023 CLINICAL DATA:  Abdominal pain, acute, nonlocalized. EXAM: CT ABDOMEN AND PELVIS WITH CONTRAST TECHNIQUE: Multidetector CT imaging of the abdomen and pelvis was performed using the standard protocol following bolus administration of intravenous contrast. RADIATION DOSE REDUCTION: This exam was performed according to the departmental dose-optimization program which includes automated exposure control, adjustment of the mA and/or kV according to patient size and/or use of iterative reconstruction technique. CONTRAST:  75mL OMNIPAQUE IOHEXOL 350 MG/ML SOLN COMPARISON:  CT abdomen/pelvis dated 06/30/2023 FINDINGS: Lower chest: Please refer to the same-day CT chest for description of intrathoracic findings. Hepatobiliary: No focal liver abnormality is seen. Status post cholecystectomy. No biliary dilatation. Pancreas: Unremarkable. No pancreatic ductal dilatation or surrounding inflammatory changes. Spleen: Normal in size without focal abnormality. Adrenals/Urinary Tract: Adrenal glands are unremarkable. Stable appearance of the kidneys including a densely calcified cyst at the superior pole of the left kidney, for which no follow-up imaging is recommended. No renal calculi or hydronephrosis. The bladder is decompressed with a suprapubic catheter in place. Stomach/Bowel: Stable postoperative changes of the stomach related to prior gastrocolic fistula repair at the level of the gastric body. No evidence of recurrent fistula. The appendix is normal. Stable left lower quadrant colostomy. No evidence of obstruction. Colonic diverticulosis without evidence of acute diverticulitis. No abdominopelvic ascites. No intraperitoneal free air. Vascular/Lymphatic: Aortic atherosclerosis. No enlarged abdominal or pelvic lymph nodes.  Reproductive: Prostate is unremarkable. Other: Similar appearance of a right posterior ischial decubitus ulcer which extends to the level of the right ischial tuberosity with surrounding soft tissue thickening. Chronic appearing sclerotic changes of the underlying right ischial tuberosity without definite areas of acute osteolysis identified. Similar cutaneous thickening overlying the sacrum and left ischial tuberosity. Postoperative changes along the ventral abdominal wall. Musculoskeletal: No acute osseous abnormality. Stable fusion of the L4-L5 vertebral bodies. IMPRESSION: 1. No acute localizing findings in the abdomen or pelvis. 2. Stable postoperative changes of the stomach related to prior gastrocolic fistula repair without evidence of recurrent fistula. 3. Similar appearance of a right posterior ischial decubitus ulcer with chronic sclerotic changes of the underlying right ischial tuberosity. No definite acute osteolysis is identified. 4. Stable left lower quadrant colostomy. 5. Stable suprapubic catheter. 6.  Aortic Atherosclerosis (ICD10-I70.0). 7. Additional unchanged ancillary findings as described above. Electronically Signed   By: Hart Robinsons M.D.   On: 07/19/2023 13:22   CT Angio Chest PE W and/or Wo Contrast  Result Date: 07/19/2023 CLINICAL DATA:  Pulmonary embolism (PE) suspected, high prob. Weakness. Bradycardia. EXAM: CT ANGIOGRAPHY CHEST WITH CONTRAST TECHNIQUE: Multidetector CT imaging of the chest was performed using the standard protocol during bolus administration of intravenous contrast. Multiplanar CT image reconstructions and MIPs were obtained to evaluate the vascular anatomy. RADIATION DOSE REDUCTION: This exam was performed according to the departmental dose-optimization program which includes automated exposure control, adjustment of the mA and/or  kV according to patient size and/or use of iterative reconstruction technique. CONTRAST:  75mL OMNIPAQUE IOHEXOL 350 MG/ML SOLN  COMPARISON:  CTA chest dated November 01, 2021. FINDINGS: Cardiovascular: Satisfactory opacification of the pulmonary arteries to the segmental level. No evidence of pulmonary embolism. Heart size is borderline enlarged. Multivessel coronary artery calcifications. Atherosclerotic calcifications of the thoracic aorta and arch branch vessels. There is a small pericardial effusion. Right-sided PICC tip at the level of the superior cavoatrial junction. Mediastinum/Nodes: No enlarged mediastinal, hilar, or axillary lymph nodes. Thyroid gland, trachea, and esophagus demonstrate no significant findings. Lungs/Pleura: Moderate-sized left-greater-than-right pleural effusions with basilar atelectasis. Consolidative opacities in the posterior left lower lung base. No pneumothorax. Upper Abdomen: Please refer to the same-day CT abdomen/pelvis for description of intra-abdominal findings. Musculoskeletal: No acute osseous abnormality. No suspicious osseous lesion. Review of the MIP images confirms the above findings. IMPRESSION: 1. No evidence of acute pulmonary embolism. 2. Moderate-sized left-greater-than-right pleural effusions with basilar atelectasis. Consolidative opacities in the posterior left lower lung base may represent atelectasis, however, an underlying infiltrate or mass can not be excluded. 3. Small pericardial effusion. 4.  Aortic Atherosclerosis (ICD10-I70.0). Electronically Signed   By: Hart Robinsons M.D.   On: 07/19/2023 12:57   CT HEAD WO CONTRAST  Result Date: 07/19/2023 CLINICAL DATA:  Provided history: Mental status change, unknown cause. Polytrauma, blunt. Additional history provided: Generalized weakness, bradycardia, neck pain, headache, nausea, recent sepsis. EXAM: CT HEAD WITHOUT CONTRAST CT CERVICAL SPINE WITHOUT CONTRAST TECHNIQUE: Multidetector CT imaging of the head and cervical spine was performed following the standard protocol without intravenous contrast. Multiplanar CT image  reconstructions of the cervical spine were also generated. RADIATION DOSE REDUCTION: This exam was performed according to the departmental dose-optimization program which includes automated exposure control, adjustment of the mA and/or kV according to patient size and/or use of iterative reconstruction technique. COMPARISON:  Head CT 03/07/2023.  Cervical spine MRI 05/25/2020. FINDINGS: CT HEAD FINDINGS Brain: Generalized cerebral atrophy. Prominence perivascular spaces within the inferior basal ganglia, bilaterally. Apparent prominence of the retrocerebellar CSF space at midline and to the right, unchanged from the prior head CT of 03/07/2023. This may reflect a mega cisterna magna or an arachnoid cyst. There is no acute intracranial hemorrhage. No demarcated cortical infarct. No evidence of an intracranial mass. No midline shift. Vascular: No hyperdense vessel.  Atherosclerotic calcifications. Skull: No calvarial fracture or aggressive osseous lesion. Sinuses/Orbits: No mass or acute finding within the imaged orbits. Trace mucosal thickening within the right maxillary and bilateral ethmoid sinuses at the imaged levels. Other: Trace fluid within the right mastoid air cells. CT CERVICAL SPINE FINDINGS Alignment: No significant spondylolisthesis. Skull base and vertebrae: The basion-dental and atlanto-dental intervals are maintained.No evidence of acute fracture to the cervical spine. Soft tissues and spinal canal: No prevertebral fluid or swelling. No visible canal hematoma. Disc levels: Cervical spondylosis with multilevel disc space narrowing, disc bulges/central disc protrusions, uncovertebral hypertrophy and facet arthrosis. Disc space narrowing is greatest at C6-C7 and C7-T1 (moderate to advanced at these levels). Posterior disc osteophyte complex at C5-C6. Mild ossification of the posterior longitudinal ligament at the C2 level. No appreciable high-grade spinal canal stenosis. Multilevel bony neural foraminal  narrowing. Multilevel ventral osteophytes, most prominent at C5-C6 and C6-C7. Upper chest: Partially imaged bilateral pleural effusions. Bilaterally. IMPRESSION: CT head: 1. No evidence of an acute intracranial abnormality. 2. Cerebral atrophy. CT cervical spine: 1. No evidence of an acute fracture to the cervical spine. 2. Cervical spondylosis and ossification  of the posterior longitudinal ligament as described. 3. Partially imaged bilateral pleural effusions. Electronically Signed   By: Jackey Loge D.O.   On: 07/19/2023 12:38   CT CERVICAL SPINE WO CONTRAST  Result Date: 07/19/2023 CLINICAL DATA:  Provided history: Mental status change, unknown cause. Polytrauma, blunt. Additional history provided: Generalized weakness, bradycardia, neck pain, headache, nausea, recent sepsis. EXAM: CT HEAD WITHOUT CONTRAST CT CERVICAL SPINE WITHOUT CONTRAST TECHNIQUE: Multidetector CT imaging of the head and cervical spine was performed following the standard protocol without intravenous contrast. Multiplanar CT image reconstructions of the cervical spine were also generated. RADIATION DOSE REDUCTION: This exam was performed according to the departmental dose-optimization program which includes automated exposure control, adjustment of the mA and/or kV according to patient size and/or use of iterative reconstruction technique. COMPARISON:  Head CT 03/07/2023.  Cervical spine MRI 05/25/2020. FINDINGS: CT HEAD FINDINGS Brain: Generalized cerebral atrophy. Prominence perivascular spaces within the inferior basal ganglia, bilaterally. Apparent prominence of the retrocerebellar CSF space at midline and to the right, unchanged from the prior head CT of 03/07/2023. This may reflect a mega cisterna magna or an arachnoid cyst. There is no acute intracranial hemorrhage. No demarcated cortical infarct. No evidence of an intracranial mass. No midline shift. Vascular: No hyperdense vessel.  Atherosclerotic calcifications. Skull: No  calvarial fracture or aggressive osseous lesion. Sinuses/Orbits: No mass or acute finding within the imaged orbits. Trace mucosal thickening within the right maxillary and bilateral ethmoid sinuses at the imaged levels. Other: Trace fluid within the right mastoid air cells. CT CERVICAL SPINE FINDINGS Alignment: No significant spondylolisthesis. Skull base and vertebrae: The basion-dental and atlanto-dental intervals are maintained.No evidence of acute fracture to the cervical spine. Soft tissues and spinal canal: No prevertebral fluid or swelling. No visible canal hematoma. Disc levels: Cervical spondylosis with multilevel disc space narrowing, disc bulges/central disc protrusions, uncovertebral hypertrophy and facet arthrosis. Disc space narrowing is greatest at C6-C7 and C7-T1 (moderate to advanced at these levels). Posterior disc osteophyte complex at C5-C6. Mild ossification of the posterior longitudinal ligament at the C2 level. No appreciable high-grade spinal canal stenosis. Multilevel bony neural foraminal narrowing. Multilevel ventral osteophytes, most prominent at C5-C6 and C6-C7. Upper chest: Partially imaged bilateral pleural effusions. Bilaterally. IMPRESSION: CT head: 1. No evidence of an acute intracranial abnormality. 2. Cerebral atrophy. CT cervical spine: 1. No evidence of an acute fracture to the cervical spine. 2. Cervical spondylosis and ossification of the posterior longitudinal ligament as described. 3. Partially imaged bilateral pleural effusions. Electronically Signed   By: Jackey Loge D.O.   On: 07/19/2023 12:38   DG Chest Port 1 View  Result Date: 07/19/2023 CLINICAL DATA:  Altered mental status EXAM: PORTABLE CHEST 1 VIEW COMPARISON:  07/15/2023 x-ray FINDINGS: Right-sided PICC in place with tip at the SVC right atrial junction. Overlapping cardiac leads. Stable cardiopericardial silhouette with some vascular congestion and interstitial changes. Small effusions. No pneumothorax.  Degenerative changes. Calcified aorta. IMPRESSION: No significant interval change compared to previous. Stable appearance of the right-sided PICC Electronically Signed   By: Karen Kays M.D.   On: 07/19/2023 10:06    Procedures Procedures    Medications Ordered in ED Medications  iohexol (OMNIPAQUE) 350 MG/ML injection 75 mL (has no administration in time range)  ondansetron (ZOFRAN) injection 4 mg (4 mg Intravenous Given 07/19/23 0923)  alteplase (CATHFLO ACTIVASE) injection 2 mg (2 mg Intracatheter Given 07/19/23 1155)  iohexol (OMNIPAQUE) 350 MG/ML injection 75 mL (75 mLs Intravenous Contrast Given 07/19/23 1043)  furosemide (LASIX) injection 40 mg (40 mg Intravenous Given 07/19/23 1355)    ED Course/ Medical Decision Making/ A&P                                 Medical Decision Making Amount and/or Complexity of Data Reviewed Labs: ordered. Radiology: ordered.  Risk Prescription drug management. Decision regarding hospitalization.   [name]. is here with confusion.  Some generalized weakness.  Overall very difficult to get history from this patient as he is confused when he showed up.  He had bradycardia but EKG shows sinus bradycardia per my review interpretation.  He was a little hypothermic.  He is currently on IV Zosyn for osteomyelitis of the right foot.  He is a paraplegic.  He has heart failure.  Multiple complex medical problems.  Overall broad workup was done to look for differential of infectious process versus neurologic process versus pulmonary or cardiac process.  Overall per my review and interpretation of labs she does not have any significant leukocytosis or anemia or electrolyte abnormality.  Troponin and BNP are fairly at baseline.  COVID test negative.  Urinalysis negative for infection.  He had a CT scan of his head neck chest abdomen and pelvis and overall per radiology report there is really no acute findings except for he has large pleural effusions  that have seemingly progressively have gotten worse.  However on my reevaluation he is awake and alert pleasant his family is at the bedside.  I think he has been having maybe some delirium episodes between being in the hospital often this past month and being at his skilled nursing facility.  Overall I talk with pulmonology and they came down to evaluate the patient regarding his pleural effusions.  Ultimately they think that this is likely from the IV antibiotics that he is given.  Eksir salt load likely causing him to retain fluid more.  They do not want to do a thoracentesis at this time and they think that doing IV Lasix and admission would be more beneficial for the patient.  Patient was amenable to this plan.  I gave him a dose IV Lasix and patient to be admitted for further optimization. This chart was dictated using voice recognition software.  Despite best efforts to proofread,  errors can occur which can change the documentation meaning.          Final Clinical Impression(s) / ED Diagnoses Final diagnoses:  Confusion  Pleural effusion    Rx / DC Orders ED Discharge Orders     None         Virgina [place], DO 07/19/23 1451

## 2023-07-19 NOTE — Progress Notes (Signed)
Patient arrived to floor, wife and grandson at bedside. MD at bedside.

## 2023-07-19 NOTE — Consult Note (Signed)
NAME:  Brent Brunick., MRN:  [id number], DOB:  June 08, 1946, LOS: 0 ADMISSION DATE:  07/19/2023, CONSULTATION DATE:  10/30 REFERRING MD:  Dr. Lockie Mola, CHIEF COMPLAINT:  pleural effusions   History of Present Illness:  Patient is a 78 yo M w/ pertinent PMH quadriparesis/quadriplegia from transverse myelitis, HFpEF, hx of osteomyelitis of right fifth metatarsal and septic arthritis of fifth MTP joint on zosyn until 08/14/2023, GI fistula s/p wedge resection 05/2023, neurogenic bladder with suprapubic catheter, DMT2, htn, hld, asthma presents to [facility] w/ generalized weakness.   Patient recently hospitalized on 10/11-10/17 w/ right foot osteomyelitis. OR cultures growing. Providencia/Proteus/E faecalis/Bacteroides.  Scheduled to be on 6 weeks of IV Zosyn per ID until 08/14/2023.   On 10/25 patient readmitted w/ sob. CXR w/ increasing small pleural effusions. Patient given lasix and discharged on 10/28.   On 10/30 patient w/ generalized weakness. EMS called and noted to be bradycardic w/ HR 40-50s. She was complaining of neck pain, HA, nausea. Transferred to [facility]. On arrival patient hypothermic 94.3 and WBC 11.8 (7.6 4 days ago). LA 1.7. BP stable and HR 50s. Cultures obtained. Trop 4 and bnp 136. CXR 10/30 w/ improved pleural effusions compared to prior CXR on 10/26. CT chest showing moderate sized L>R pleural effusions w/ basilar atelectasis; consolidative opacities in LLL possible atelectasis. PCCM consulted.  Pertinent  Medical History   Past Medical History:  Diagnosis Date   Acute osteomyelitis of metatarsal bone of right foot (HCC) 07/01/2023   Allergy    Aortic atherosclerosis (HCC) 07/01/2023   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 angioplasty of his RCA in 1990. b. bare metal stent placed in the RCA in 2000 followed by rotational atherectomy shortly after for stent restenosis. c. last cath was in 2012 showing stable moderate diffuse CAD.  (70% mid LAD, 80% diagonal, 70% Ramus, 40% mid to distal RCA stent restenosis). d. Low risk nuc in 2015.   Diabetes mellitus    Diverticulosis    Elevated CK    Erectile dysfunction    Hemorrhoids    HTN (hypertension)    Hyperlipidemia    Hypertriglyceridemia    Malignant melanoma of left side of neck (HCC) 10/25/2018   Myocardial infarction (HCC)    Obesity    OSA (obstructive sleep apnea)    Persistent disorder of initiating or maintaining sleep    Personal history of colonic polyps 02/05/2003   Renal lesion 06/20/2016   Septic arthritis of foot (HCC) 07/01/2023   Stage IV decubitus ulcer (HCC) 06/30/2023     Significant Hospital Events: Including procedures, antibiotic start and stop dates in addition to other pertinent events     Interim History / Subjective:  As above  Objective   Blood pressure (!) 145/53, pulse (!) 52, temperature (!) 94.3 F (34.6 C), temperature source Oral, resp. rate 18, SpO2 99%.       No intake or output data in the 24 hours ending 07/19/23 1324 There were no vitals filed for this visit.  Examination: General: Chronically ill-appearing lying in bed in no acute distress HENT: Moist mucous membranes, atraumatic normocephalic Lungs: Diminished in bases, normal work of breathing on room air Cardiovascular: Borderline bradycardic, edema noted Abdomen: Nondistended, nontender MSK: No synovitis, no joint effusion  Neuro: Cranial nerves appear intact, voice is soft  Psych: Flat affect, normal mood  Resolved Hospital Problem list   N/a  Assessment & Plan:   Bilateral pleural effusions:  Present and progressive on serial CT scans dating back to September 2024.  Suspect worsened in the setting of recent hospitalization IV fluid load etc.  Recently hospitalized for CHF exacerbation.  Chest x-ray actually appears improved.  Suspect related to cardiogenic volume overload.  He is on room air at rest, breathing comfortably. -- Consider admission for  IV diuresis -- I see no role for thoracentesis at this time -- Recommend counseling on low-salt diet and optimizing oral medications, diuretics, prior to discharge.  PCCM will sign off  Best Practice (right click and "Reselect all SmartList Selections" daily)   N/a  Labs   CBC: Recent Labs  Lab 07/14/23 1210 07/15/23 0225 07/19/23 0920 07/19/23 0933  WBC 8.8 7.6 11.8*  --   NEUTROABS  --   --  9.5*  --   HGB 9.5* 9.2* 9.4* 10.2*  9.9*  HCT 31.5* 30.0* 32.0* 30.0*  29.0*  MCV 84.2 81.3 84.9  --   PLT 359 373 375  --     Basic Metabolic Panel: Recent Labs  Lab 07/14/23 1210 07/15/23 0225 07/16/23 0430 07/17/23 0235 07/19/23 0920 07/19/23 0933  NA 141 137 137 135 140 140  140  K 3.5 3.3* 3.3* 3.4* 3.6 3.6  3.6  CL 104 101 98 95* 102 100  CO2 27 26 30 31 26   --   GLUCOSE 112* 145* 157* 235* 142* 141*  BUN 6* 8 8 12 9 8   CREATININE 0.46* 0.56* 0.53* 0.65 0.60* 0.50*  CALCIUM 8.7* 8.5* 8.3* 8.4* 8.6*  --   MG  --  1.5* 1.5* 1.8  --   --    GFR: Estimated Creatinine Clearance: 91.9 mL/min (A) (by C-G formula based on SCr of 0.5 mg/dL (L)). Recent Labs  Lab 07/14/23 1210 07/14/23 1820 07/15/23 0225 07/19/23 0920 07/19/23 0933  PROCALCITON  --  <0.10  --   --   --   WBC 8.8  --  7.6 11.8*  --   LATICACIDVEN  --   --   --   --  1.7    Liver Function Tests: Recent Labs  Lab 07/14/23 1210 07/19/23 0920  AST 13* 19  ALT 12 16  ALKPHOS 64 67  BILITOT 0.4 0.4  PROT 5.6* 5.5*  ALBUMIN 2.1* 2.3*   No results for input(s): "LIPASE", "AMYLASE" in the last 168 hours. Recent Labs  Lab 07/19/23 0920  AMMONIA 16    ABG    Component Value Date/Time   PHART 7.405 03/15/2020 2305   PCO2ART 40.4 03/15/2020 2305   PO2ART 113 (H) 03/15/2020 2305   HCO3 28.9 (H) 07/19/2023 0933   TCO2 27 07/19/2023 0933   TCO2 30 07/19/2023 0933   O2SAT 88 07/19/2023 0933     Coagulation Profile: Recent Labs  Lab 07/19/23 0920  INR 1.1    Cardiac Enzymes: No  results for input(s): "CKTOTAL", "CKMB", "CKMBINDEX", "TROPONINI" in the last 168 hours.  HbA1C: Hgb A1c MFr Bld  Date/Time Value Ref Range Status  06/06/2023 02:36 AM 7.5 (H) 4.8 - 5.6 % Final    Comment:    (NOTE) Pre diabetes:          5.7%-6.4%  Diabetes:              >6.4%  Glycemic control for   <7.0% adults with diabetes   05/22/2023 03:44 AM 8.0 (H) 4.8 - 5.6 % Final    Comment:    (NOTE) Pre diabetes:  5.7%-6.4%  Diabetes:              >6.4%  Glycemic control for   <7.0% adults with diabetes     CBG: Recent Labs  Lab 07/16/23 1613 07/16/23 2140 07/17/23 0625 07/17/23 1255 07/17/23 1739  GLUCAP 212* 249* 223* 210* 208*    Review of Systems:   No chest pain no orthopnea or PND.  Comprehensive review of systems otherwise negative.    Past Medical History:  He,  has a past medical history of Acute osteomyelitis of metatarsal bone of right foot (HCC) (07/01/2023), Allergy, Aortic atherosclerosis (HCC) (07/01/2023), Benign neuroendocrine tumor of stomach (04/04/2022), CAD (coronary artery disease), Diabetes mellitus, Diverticulosis, Elevated CK, Erectile dysfunction, Hemorrhoids, HTN (hypertension), Hyperlipidemia, Hypertriglyceridemia, Malignant melanoma of left side of neck (HCC) (10/25/2018), Myocardial infarction (HCC), Obesity, OSA (obstructive sleep apnea), Persistent disorder of initiating or maintaining sleep, Personal history of colonic polyps (02/05/2003), Renal lesion (06/20/2016), Septic arthritis of foot (HCC) (07/01/2023), and Stage IV decubitus ulcer (HCC) (06/30/2023).   Surgical History:   Past Surgical History:  Procedure Laterality Date   AMPUTATION TOE Right 07/02/2023   Procedure: AMPUTATION TOE FIFTH RAY;  Surgeon: Netta Cedars, MD;  Location: [facility] ORS;  Service: Orthopedics;  Laterality: Right;   BIOPSY  03/26/2022   Procedure: BIOPSY;  Surgeon: Iva Boop, MD;  Location: [facility] ENDOSCOPY;  Service: Gastroenterology;;    CHOLECYSTECTOMY     CORONARY ANGIOPLASTY     CORONARY STENT PLACEMENT     stenting of the right coronary artery with followup rotational  atherectomy. (3 stents placed)   ENDOSCOPIC MUCOSAL RESECTION  10/13/2022   Procedure: ENDOSCOPIC MUCOSAL RESECTION;  Surgeon: Meridee Score Netty Starring., MD;  Location: [facility]COPY;  Service: Gastroenterology;;   ESOPHAGOGASTRODUODENOSCOPY (EGD) WITH PROPOFOL N/A 03/26/2022   Procedure: ESOPHAGOGASTRODUODENOSCOPY (EGD) WITH PROPOFOL;  Surgeon: Iva Boop, MD;  Location: [facility] ENDOSCOPY;  Service: Gastroenterology;  Laterality: N/A;   ESOPHAGOGASTRODUODENOSCOPY (EGD) WITH PROPOFOL N/A 07/11/2022   Procedure: ESOPHAGOGASTRODUODENOSCOPY (EGD) WITH PROPOFOL;  Surgeon: Meridee Score Netty Starring., MD;  Location: [facility] ENDOSCOPY;  Service: Gastroenterology;  Laterality: N/A;   ESOPHAGOGASTRODUODENOSCOPY (EGD) WITH PROPOFOL N/A 10/13/2022   Procedure: ESOPHAGOGASTRODUODENOSCOPY (EGD) WITH PROPOFOL;  Surgeon: Meridee Score Netty Starring., MD;  Location: [facility] ENDOSCOPY;  Service: Gastroenterology;  Laterality: N/A;   EUS N/A 10/13/2022   Procedure: UPPER ENDOSCOPIC ULTRASOUND (EUS) RADIAL;  Surgeon: Lemar Lofty., MD;  Location: [facility] ENDOSCOPY;  Service: Gastroenterology;  Laterality: N/A;   FINGER SURGERY     right   FOOT SURGERY     right   HEMOSTASIS CLIP PLACEMENT  07/11/2022   Procedure: HEMOSTASIS CLIP PLACEMENT;  Surgeon: Meridee Score Netty Starring., MD;  Location: [facility]OSCOPY;  Service: Gastroenterology;;  ovesco    HEMOSTASIS CLIP PLACEMENT  10/13/2022   Procedure: HEMOSTASIS CLIP PLACEMENT;  Surgeon: Lemar Lofty., MD;  Location: [facility] ENDOSCOPY;  Service: Gastroenterology;;   HOT HEMOSTASIS N/A 07/11/2022   Procedure: HOT HEMOSTASIS (ARGON PLASMA COAGULATION/BICAP);  Surgeon: Lemar Lofty., MD;  Location: [facility]OSCOPY;  Service: Gastroenterology;  Laterality: N/A;   HOT HEMOSTASIS N/A 10/13/2022   Procedure: HOT HEMOSTASIS (ARGON PLASMA  COAGULATION/BICAP);  Surgeon: Lemar Lofty., MD;  Location: [facility]COPY;  Service: Gastroenterology;  Laterality: N/A;   INGUINAL HERNIA REPAIR     right   IR FLUORO PROCEDURE UNLISTED  02/21/2023   IR GASTROSTOMY TUBE REMOVAL  02/10/2022   IR REPLACE G-TUBE SIMPLE WO FLUORO  03/14/2023   IR REPLC GASTRO/COLONIC TUBE PERCUT W/FLUORO  12/22/2022  IR REPLC GASTRO/COLONIC TUBE PERCUT W/FLUORO  01/26/2023   IR REPLC GASTRO/COLONIC TUBE PERCUT W/FLUORO  03/06/2023   IR REPLC GASTRO/COLONIC TUBE PERCUT W/FLUORO  04/27/2023   IRRIGATION AND DEBRIDEMENT ABSCESS N/A 06/04/2020   Procedure: IRRIGATION AND DEBRIDEMENT SACRAL WOUND;  Surgeon: Diamantina Monks, MD;  Location: [facility] OR;  Service: General;  Laterality: N/A;   LAPAROSCOPY N/A 06/04/2020   Procedure: LAPAROSCOPY ASSISTED COLOSTOMY, LAPAROSCOPY GASTROSTOMY;  Surgeon: Diamantina Monks, MD;  Location: [facility] OR;  Service: General;  Laterality: N/A;   LAPAROSCOPY N/A 05/24/2023   Procedure: LAPAROSCOPY DIAGNOSTIC;  Surgeon: Emelia Loron, MD;  Location: [facility] OR;  Service: General;  Laterality: N/A;   LAPAROTOMY N/A 05/24/2023   Procedure: WEDGED RESECTION OF STOMACH AND GASTROCUTANEOUS FISTULA;  Surgeon: Emelia Loron, MD;  Location: [facility] OR;  Service: General;  Laterality: N/A;   LYSIS OF ADHESION N/A 06/04/2020   Procedure: LYSIS OF ADHESION;  Surgeon: Diamantina Monks, MD;  Location: [facility] OR;  Service: General;  Laterality: N/A;   LYSIS OF ADHESION N/A 05/24/2023   Procedure: LYSIS OF ADHESION;  Surgeon: Emelia Loron, MD;  Location: [facility] OR;  Service: General;  Laterality: N/A;   melanoma removal     neck   ORTHOPEDIC SURGERY     foot right   POLYPECTOMY  10/13/2022   Procedure: POLYPECTOMY;  Surgeon: Lemar Lofty., MD;  Location: [facility] ENDOSCOPY;  Service: Gastroenterology;;   rotator cuff surg     Bil   STENT REMOVAL  10/13/2022   Procedure: CLIP REMOVAL;  Surgeon: Lemar Lofty., MD;  Location: [facility];  Service:  Gastroenterology;;   SUBMUCOSAL LIFTING INJECTION  10/13/2022   Procedure: SUBMUCOSAL LIFTING INJECTION;  Surgeon: Lemar Lofty., MD;  Location: [facility];  Service: Gastroenterology;;   SUBMUCOSAL TATTOO INJECTION  10/13/2022   Procedure: SUBMUCOSAL TATTOO INJECTION;  Surgeon: Lemar Lofty., MD;  Location: [facility] ENDOSCOPY;  Service: Gastroenterology;;     Social History:   reports that he quit smoking about 42 years ago. His smoking use included cigarettes. He started smoking about 72 years ago. He has a 45 pack-year smoking history. He has never used smokeless tobacco. He reports that he does not currently use alcohol after a past usage of about 7.0 - 14.0 standard drinks of alcohol per week. He reports that he does not use drugs.   Family History:  His family history includes COPD in his mother; Heart disease in his father and sister; Liver cancer in his father; Lung cancer in his father and mother; Obesity in his mother. There is no history of Colon cancer, Stomach cancer, Pancreatic cancer, Esophageal cancer, Inflammatory bowel disease, Liver disease, or Rectal cancer.   Allergies Allergies  Allergen Reactions   Cipro [Ciprofloxacin Hcl] Other (See Comments)    Hallucinations Allergy not listed on MAR   Neurontin [Gabapentin] Other (See Comments)    Unknown reaction   Levaquin [Levofloxacin] Rash     Home Medications  Prior to Admission medications   Medication Sig Start Date End Date Taking? Authorizing Provider  acetaminophen (TYLENOL) 500 MG tablet Take 2 tablets (1,000 mg total) by mouth every 6 (six) hours as needed for mild pain (or Fever >/= 101). 05/31/23  Yes Rhetta Mura, MD  atenolol (TENORMIN) 25 MG tablet Take 25 mg by mouth at bedtime.   Yes [provider]  atenolol (TENORMIN) 25 MG tablet Take 12.5 mg by mouth in the morning.   Yes [provider]  atorvastatin (LIPITOR) 40 MG  tablet Take 1 tablet (40 mg total) by mouth at  bedtime. 05/31/23  Yes Rhetta Mura, MD  baclofen (LIORESAL) 20 MG tablet Take 1 tablet (20 mg total) by mouth 3 (three) times daily. 06/06/23  Yes Amin, Ankit C, MD  benzonatate (TESSALON) 100 MG capsule Take 100 mg by mouth every 8 (eight) hours. 07/13/23 07/20/23 Yes [provider]  cholecalciferol (VITAMIN D3) 25 MCG (1000 UNIT) tablet Take 2,000 Units by mouth daily.   Yes [provider]  DULoxetine (CYMBALTA) 30 MG capsule Take 1 capsule (30 mg total) by mouth at bedtime. 05/31/23  Yes Rhetta Mura, MD  famotidine (PEPCID) 20 MG tablet Take 20 mg by mouth 2 (two) times daily.   Yes [provider]  furosemide (LASIX) 40 MG tablet Take 1 tablet (40 mg total) by mouth daily. 07/18/23  Yes Meredeth Ide, MD  guaiFENesin (MUCINEX) 600 MG 12 hr tablet Take 600 mg by mouth 2 (two) times daily.   Yes [provider]  guaiFENesin-dextromethorphan (ROBITUSSIN DM) 100-10 MG/5ML syrup Take 10 mLs by mouth every 6 (six) hours as needed for cough.   Yes [provider]  insulin aspart (NOVOLOG) 100 UNIT/ML injection Inject 0-15 Units into the skin every 4 (four) hours. Sliding scale  CBG 70 - 120: 0 units: CBG 121 - 150: 2 units; CBG 151 - 200: 3 units; CBG 201 - 250: 5 units; CBG 251 - 300: 8 units;CBG 301 - 350: 11 units; CBG 351 - 400: 15 units; CBG > 400 : 15 units and notify MD Patient taking differently: Inject 0-15 Units into the skin See admin instructions. Inject 0-15 units before meals per sliding scale: 70-120 : 0 units 121-150 : 2 units 151-200 : 3 units 201-250 : 5 units 251-300 : 8 units 301-350 : 11 units 351-400 : 15 units > 400 : 15 units, notify MD 06/10/20  Yes Rai, Ripudeep K, MD  insulin glargine-yfgn (SEMGLEE, YFGN,) 100 UNIT/ML Pen Inject 35 Units into the skin 2 (two) times daily.   Yes [provider]  lamoTRIgine (LAMICTAL) 25 MG tablet Take 2 tablets (50 mg total) by mouth 2 (two) times daily. For 5 days.  Started 02/18/23. 07/06/23  Yes Glade Lloyd, MD  LORazepam (ATIVAN) 0.5 MG tablet Take 1 tablet (0.5 mg total) by mouth daily as needed (panic attacks). 07/06/23  Yes Glade Lloyd, MD  losartan (COZAAR) 50 MG tablet Take 75 mg by mouth daily.   Yes [provider]  Menthol, Topical Analgesic, (BIOFREEZE) 4 % GEL Apply 1 application  topically 2 (two) times daily as needed (pain to back (upper and lower),arms and shoulders).   Yes [provider]  metFORMIN (GLUCOPHAGE) 1000 MG tablet Take 1,000 mg by mouth in the morning and at bedtime.   Yes [provider]  nystatin powder Apply 1 Application topically daily. Apply to peri-wound bed, sacrum and right ischium daily with routine dressing.   Yes [provider]  omeprazole (PRILOSEC) 40 MG capsule Take 1 capsule (40 mg total) by mouth 2 (two) times daily. 30 minutes before breakfast/dinner 10/13/22  Yes Mansouraty, Netty Starring., MD  ondansetron (ZOFRAN-ODT) 4 MG disintegrating tablet Take 4 mg by mouth every 8 (eight) hours as needed for nausea or vomiting.   Yes [provider]  oxyCODONE (OXY IR/ROXICODONE) 5 MG immediate release tablet Take 1 tablet (5 mg total) by mouth 2 (two) times daily as needed for breakthrough pain. 07/17/23  Yes Meredeth Ide, MD  piperacillin-tazobactam (ZOSYN) IVPB Inject 3.375 g into the vein every 6 (six) hours. Indication: diabetic foot infection/osteomyelitis  First Dose: Yes Last Day of Therapy: 08/15/2023 Labs - Once weekly:  CBC/D and BMP, Labs - Once weekly: ESR and CRP 07/06/23 08/15/23 Yes Alekh, Kshitiz, MD  Polyethyl Glycol-Propyl Glycol (GENTEAL TEARS SEVERE DAY/NIGHT) 0.4-0.3 % GEL ophthalmic gel Place 1 Application into both eyes 4 (four) times daily as needed (dry eyes).   Yes [provider]  polyethylene glycol (MIRALAX / GLYCOLAX) 17 g packet Take 17 g by mouth daily as needed for mild constipation. 07/06/23  Yes Glade Lloyd, MD  potassium  chloride SA (KLOR-CON M) 20 MEQ tablet Take 1 tablet (20 mEq total) by mouth daily. Take along with Lasix 07/17/23  Yes Lama, Sarina Ill, MD  riTUXimab (RITUXAN IV) Inject 10 mg into the vein once. Between the 12th-13th of June, December.   Yes [provider]  senna-docusate (SENOKOT-S) 8.6-50 MG tablet Take 1 tablet by mouth 2 (two) times daily.   Yes [provider]  spironolactone (ALDACTONE) 25 MG tablet Take 25 mg by mouth daily.   Yes [provider]  traZODone (DESYREL) 100 MG tablet Take 100 mg by mouth at bedtime.   Yes [provider]  vitamin C (ASCORBIC ACID) 500 MG tablet Take 500 mg by mouth daily.   Yes [provider]  zinc sulfate 220 (50 Zn) MG capsule Take 1 capsule (220 mg total) by mouth daily. 07/07/23  Yes Glade Lloyd, MD         Karren Burly, MD [facility] 07/19/2023, 1:24 PM  Please see [email or web address] for pager details.  From 7A-7P if no response, please call [phone]. After hours, please call ELink [phone].

## 2023-07-19 NOTE — ED Notes (Signed)
ED TO INPATIENT HANDOFF REPORT  ED Nurse Name and Phone #: [place], [phone]  S Name/Age/Gender Brent [name] 77 y.o. male Room/Bed: 031C/031C  Code Status   Code Status: Prior  Home/SNF/Other Nursing Home Patient oriented to: self Is this baseline? Yes   Triage Complete: Triage complete  Chief Complaint Pleural effusion [J90]  Triage Note Bib ems from [facility] with reports of generalized weakness and bradycardia onset today. Took morning dose of atenolol. Pt with HR 40-50's. VSS with ems. Pt pale on arrival complaining of neck pain, headache and nausea. Pt recently admitted for sepsis.    Allergies Allergies  Allergen Reactions   Cipro [Ciprofloxacin Hcl] Other (See Comments)    Hallucinations Allergy not listed on MAR   Neurontin [Gabapentin] Other (See Comments)    Unknown reaction   Levaquin [Levofloxacin] Rash    Level of Care/Admitting Diagnosis ED Disposition     ED Disposition  Admit   Condition  --   Comment  Hospital Area: [facility] [[id number]]  Level of Care: Telemetry Cardiac [103]  May place patient in observation at [facility] or [facility] if equivalent level of care is available:: No  Covid Evaluation: Asymptomatic - no recent exposure (last 10 days) testing not required  Diagnosis: Pleural effusion [242230]  Admitting Physician: Clydie Braun [[id number]]  Attending Physician: Clydie Braun [[id number]]          B Medical/Surgery History Past Medical History:  Diagnosis Date   Acute osteomyelitis of metatarsal bone of right foot (HCC) 07/01/2023   Allergy    Aortic atherosclerosis (HCC) 07/01/2023   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 angioplasty of his RCA in 1990. b. bare metal stent placed in the RCA in 2000 followed by rotational atherectomy shortly after for stent restenosis. c. last cath was in 2012 showing stable moderate diffuse CAD. (70% mid LAD, 80%  diagonal, 70% Ramus, 40% mid to distal RCA stent restenosis). d. Low risk nuc in 2015.   Diabetes mellitus    Diverticulosis    Elevated CK    Erectile dysfunction    Hemorrhoids    HTN (hypertension)    Hyperlipidemia    Hypertriglyceridemia    Malignant melanoma of left side of neck (HCC) 10/25/2018   Myocardial infarction (HCC)    Obesity    OSA (obstructive sleep apnea)    Persistent disorder of initiating or maintaining sleep    Personal history of colonic polyps 02/05/2003   Renal lesion 06/20/2016   Septic arthritis of foot (HCC) 07/01/2023   Stage IV decubitus ulcer (HCC) 06/30/2023   Past Surgical History:  Procedure Laterality Date   AMPUTATION TOE Right 07/02/2023   Procedure: AMPUTATION TOE FIFTH RAY;  Surgeon: Netta Cedars, MD;  Location: [facility];  Service: Orthopedics;  Laterality: Right;   BIOPSY  03/26/2022   Procedure: BIOPSY;  Surgeon: Iva Boop, MD;  Location: [facility] ENDOSCOPY;  Service: Gastroenterology;;   CHOLECYSTECTOMY     CORONARY ANGIOPLASTY     CORONARY STENT PLACEMENT     stenting of the right coronary artery with followup rotational  atherectomy. (3 stents placed)   ENDOSCOPIC MUCOSAL RESECTION  10/13/2022   Procedure: ENDOSCOPIC MUCOSAL RESECTION;  Surgeon: Lemar Lofty., MD;  Location: [facility] ENDOSCOPY;  Service: Gastroenterology;;   ESOPHAGOGASTRODUODENOSCOPY (EGD) WITH PROPOFOL N/A 03/26/2022   Procedure: ESOPHAGOGASTRODUODENOSCOPY (EGD) WITH PROPOFOL;  Surgeon: Iva Boop, MD;  Location: [facility] ENDOSCOPY;  Service: Gastroenterology;  Laterality: N/A;   ESOPHAGOGASTRODUODENOSCOPY (EGD) WITH PROPOFOL N/A 07/11/2022   Procedure: ESOPHAGOGASTRODUODENOSCOPY (EGD) WITH PROPOFOL;  Surgeon: Meridee Score Netty Starring., MD;  Location: [facility] ENDOSCOPY;  Service: Gastroenterology;  Laterality: N/A;   ESOPHAGOGASTRODUODENOSCOPY (EGD) WITH PROPOFOL N/A 10/13/2022   Procedure: ESOPHAGOGASTRODUODENOSCOPY (EGD) WITH PROPOFOL;  Surgeon: Meridee Score Netty Starring.,  MD;  Location: [facility]OSCOPY;  Service: Gastroenterology;  Laterality: N/A;   EUS N/A 10/13/2022   Procedure: UPPER ENDOSCOPIC ULTRASOUND (EUS) RADIAL;  Surgeon: Lemar Lofty., MD;  Location: [facility] ENDOSCOPY;  Service: Gastroenterology;  Laterality: N/A;   FINGER SURGERY     right   FOOT SURGERY     right   HEMOSTASIS CLIP PLACEMENT  07/11/2022   Procedure: HEMOSTASIS CLIP PLACEMENT;  Surgeon: Meridee Score Netty Starring., MD;  Location: [facility]OSCOPY;  Service: Gastroenterology;;  ovesco    HEMOSTASIS CLIP PLACEMENT  10/13/2022   Procedure: HEMOSTASIS CLIP PLACEMENT;  Surgeon: Lemar Lofty., MD;  Location: [facility];  Service: Gastroenterology;;   HOT HEMOSTASIS N/A 07/11/2022   Procedure: HOT HEMOSTASIS (ARGON PLASMA COAGULATION/BICAP);  Surgeon: Lemar Lofty., MD;  Location: [facility] ENDOSCOPY;  Service: Gastroenterology;  Laterality: N/A;   HOT HEMOSTASIS N/A 10/13/2022   Procedure: HOT HEMOSTASIS (ARGON PLASMA COAGULATION/BICAP);  Surgeon: Lemar Lofty., MD;  Location: [facility] ENDOSCOPY;  Service: Gastroenterology;  Laterality: N/A;   INGUINAL HERNIA REPAIR     right   IR FLUORO PROCEDURE UNLISTED  02/21/2023   IR GASTROSTOMY TUBE REMOVAL  02/10/2022   IR REPLACE G-TUBE SIMPLE WO FLUORO  03/14/2023   IR REPLC GASTRO/COLONIC TUBE PERCUT W/FLUORO  12/22/2022   IR REPLC GASTRO/COLONIC TUBE PERCUT W/FLUORO  01/26/2023   IR REPLC GASTRO/COLONIC TUBE PERCUT W/FLUORO  03/06/2023   IR REPLC GASTRO/COLONIC TUBE PERCUT W/FLUORO  04/27/2023   IRRIGATION AND DEBRIDEMENT ABSCESS N/A 06/04/2020   Procedure: IRRIGATION AND DEBRIDEMENT SACRAL WOUND;  Surgeon: Diamantina Monks, MD;  Location: [facility] OR;  Service: General;  Laterality: N/A;   LAPAROSCOPY N/A 06/04/2020   Procedure: LAPAROSCOPY ASSISTED COLOSTOMY, LAPAROSCOPY GASTROSTOMY;  Surgeon: Diamantina Monks, MD;  Location: [facility] OR;  Service: General;  Laterality: N/A;   LAPAROSCOPY N/A 05/24/2023   Procedure: LAPAROSCOPY DIAGNOSTIC;  Surgeon:  Emelia Loron, MD;  Location: [facility] OR;  Service: General;  Laterality: N/A;   LAPAROTOMY N/A 05/24/2023   Procedure: WEDGED RESECTION OF STOMACH AND GASTROCUTANEOUS FISTULA;  Surgeon: Emelia Loron, MD;  Location: [facility] OR;  Service: General;  Laterality: N/A;   LYSIS OF ADHESION N/A 06/04/2020   Procedure: LYSIS OF ADHESION;  Surgeon: Diamantina Monks, MD;  Location: [facility] OR;  Service: General;  Laterality: N/A;   LYSIS OF ADHESION N/A 05/24/2023   Procedure: LYSIS OF ADHESION;  Surgeon: Emelia Loron, MD;  Location: [facility] OR;  Service: General;  Laterality: N/A;   melanoma removal     neck   ORTHOPEDIC SURGERY     foot right   POLYPECTOMY  10/13/2022   Procedure: POLYPECTOMY;  Surgeon: Lemar Lofty., MD;  Location: [facility];  Service: Gastroenterology;;   rotator cuff surg     Bil   STENT REMOVAL  10/13/2022   Procedure: CLIP REMOVAL;  Surgeon: Lemar Lofty., MD;  Location: [facility]COPY;  Service: Gastroenterology;;   SUBMUCOSAL LIFTING INJECTION  10/13/2022   Procedure: SUBMUCOSAL LIFTING INJECTION;  Surgeon: Lemar Lofty., MD;  Location: [facility]OSCOPY;  Service: Gastroenterology;;   SUBMUCOSAL TATTOO INJECTION  10/13/2022   Procedure: SUBMUCOSAL TATTOO INJECTION;  Surgeon: Lemar Lofty., MD;  Location[facility];  Service: Gastroenterology;;     A IV Location/Drains/Wounds Patient Lines/Drains/Airways Status     Active Line/Drains/Airways     Name Placement date Placement time Site Days   Peripheral IV 07/19/23 20 G Posterior;Left Forearm 07/19/23  --  Forearm  less than 1   PICC Single Lumen 07/06/23 Right Cephalic 41 cm 1 cm 07/06/23  1610  Cephalic  13   Colostomy LLQ --  --  LLQ  --   Suprapubic Catheter Latex 20 Fr. 02/22/23  1550  Latex  147   Incision - 4 Ports Abdomen 1: Right 2: Umbilicus 3: Left 4: Left;Lateral 05/24/23  1416  -- 56   Pressure Injury 02/22/23 02/22/23  0800  -- 147   Pressure Injury 07/01/23 Buttocks  Bilateral Deep Tissue Pressure Injury - Purple or maroon localized area of discolored intact skin or blood-filled blister due to damage of underlying soft tissue from pressure and/or shear. Erythema and purple discolorat 07/01/23  0732  -- 18   Pressure Injury 07/01/23 Ischial tuberosity Posterior;Proximal;Right Stage 3 -  Full thickness tissue loss. Subcutaneous fat may be visible but bone, tendon or muscle are NOT exposed. pink  area with white/yellow center 07/01/23  0732  -- 18   Wound / Incision (Open or Dehisced) 05/21/23 Diabetic ulcer Foot Right black round wound 05/21/23  1701  Foot  59   Wound / Incision (Open or Dehisced) 07/01/23 Other (Comment) Leg Right;Posterior;Lateral Stage II Ulcer 07/01/23  0800  Leg  18            Intake/Output Last 24 hours No intake or output data in the 24 hours ending 07/19/23 1513  Labs/Imaging Results for orders placed or performed during the hospital encounter of 07/19/23 (from the past 48 hour(s))  Comprehensive metabolic panel     Status: Abnormal   Collection Time: 07/19/23  9:20 AM  Result Value Ref Range   Sodium 140 135 - 145 mmol/L   Potassium 3.6 3.5 - 5.1 mmol/L   Chloride 102 98 - 111 mmol/L   CO2 26 22 - 32 mmol/L   Glucose, Bld 142 (H) 70 - 99 mg/dL    Comment: Glucose reference range applies only to samples taken after fasting for at least 8 hours.   BUN 9 8 - 23 mg/dL   Creatinine, Ser 9.60 (L) 0.61 - 1.24 mg/dL   Calcium 8.6 (L) 8.9 - 10.3 mg/dL   Total Protein 5.5 (L) 6.5 - 8.1 g/dL   Albumin 2.3 (L) 3.5 - 5.0 g/dL   AST 19 15 - 41 U/L   ALT 16 0 - 44 U/L   Alkaline Phosphatase 67 38 - 126 U/L   Total Bilirubin 0.4 0.3 - 1.2 mg/dL   GFR, Estimated >45 >40 mL/min    Comment: (NOTE) Calculated using the CKD-EPI Creatinine Equation (2021)    Anion gap 12 5 - 15    Comment: Performed at [facility], [address], [place], [place] [address]  CBC with Differential/Platelet     Status: Abnormal   Collection Time:  07/19/23  9:20 AM  Result Value Ref Range   WBC 11.8 (H) 4.0 - 10.5 K/uL   RBC 3.77 (L) 4.22 - 5.81 MIL/uL   Hemoglobin 9.4 (L) 13.0 - 17.0 g/dL   HCT 14.7 (L) 82.9 - 56.2 %   MCV 84.9 80.0 - 100.0 fL   MCH 24.9 (L) 26.0 - 34.0 pg   MCHC 29.4 (L) 30.0 - 36.0  g/dL   RDW 86.5 (H) 78.4 - 69.6 %   Platelets 375 150 - 400 K/uL   nRBC 0.0 0.0 - 0.2 %   Neutrophils Relative % 81 %   Neutro Abs 9.5 (H) 1.7 - 7.7 K/uL   Lymphocytes Relative 10 %   Lymphs Abs 1.2 0.7 - 4.0 K/uL   Monocytes Relative 6 %   Monocytes Absolute 0.7 0.1 - 1.0 K/uL   Eosinophils Relative 1 %   Eosinophils Absolute 0.2 0.0 - 0.5 K/uL   Basophils Relative 1 %   Basophils Absolute 0.1 0.0 - 0.1 K/uL   Immature Granulocytes 1 %   Abs Immature Granulocytes 0.10 (H) 0.00 - 0.07 K/uL    Comment: Performed at [facility], [address], [place], [place] [address]  Ammonia     Status: None   Collection Time: 07/19/23  9:20 AM  Result Value Ref Range   Ammonia 16 9 - 35 umol/L    Comment: Performed at [facility], [address], [place], [place] [address]  Protime-INR     Status: None   Collection Time: 07/19/23  9:20 AM  Result Value Ref Range   Prothrombin Time 14.6 11.4 - 15.2 seconds   INR 1.1 0.8 - 1.2    Comment: (NOTE) INR goal varies based on device and disease states. Performed at [facility], [address] [place], [place] [address]   Troponin I (High Sensitivity)     Status: None   Collection Time: 07/19/23  9:20 AM  Result Value Ref Range   Troponin I (High Sensitivity) 4 <18 ng/L    Comment: (NOTE) Elevated high sensitivity troponin I (hsTnI) values and significant  changes across serial measurements may suggest ACS but many other  chronic and acute conditions are known to elevate hsTnI results.  Refer to the "Links" section for chest pain algorithms and additional  guidance. Performed at [facility], [address] [place], [place] [address]   Brain natriuretic  peptide     Status: Abnormal   Collection Time: 07/19/23  9:20 AM  Result Value Ref Range   B Natriuretic Peptide 136.4 (H) 0.0 - 100.0 pg/mL    Comment: Performed at [facility], [address], [place], [place] [address]  I-stat chem 8, ED (not at [facility], [facility] or [facility])     Status: Abnormal   Collection Time: 07/19/23  9:33 AM  Result Value Ref Range   Sodium 140 135 - 145 mmol/L   Potassium 3.6 3.5 - 5.1 mmol/L   Chloride 100 98 - 111 mmol/L   BUN 8 8 - 23 mg/dL   Creatinine, Ser 4.74 (L) 0.61 - 1.24 mg/dL   Glucose, Bld 259 (H) 70 - 99 mg/dL    Comment: Glucose reference range applies only to samples taken after fasting for at least 8 hours.   Calcium, Ion 1.09 (L) 1.15 - 1.40 mmol/L   TCO2 27 22 - 32 mmol/L   Hemoglobin 9.9 (L) 13.0 - 17.0 g/dL   HCT 56.3 (L) 87.5 - 64.3 %  I-Stat venous blood gas, ([facility] ED, [facility], [facility])     Status: Abnormal   Collection Time: 07/19/23  9:33 AM  Result Value Ref Range   pH, Ven 7.459 (H) 7.25 - 7.43   pCO2, Ven 40.7 (L) 44 - 60 mmHg   pO2, Ven 52 (H) 32 - 45 mmHg   Bicarbonate 28.9 (H) 20.0 - 28.0 mmol/L  TCO2 30 22 - 32 mmol/L   O2 Saturation 88 %   Acid-Base Excess 5.0 (H) 0.0 - 2.0 mmol/L   Sodium 140 135 - 145 mmol/L   Potassium 3.6 3.5 - 5.1 mmol/L   Calcium, Ion 1.08 (L) 1.15 - 1.40 mmol/L   HCT 30.0 (L) 39.0 - 52.0 %   Hemoglobin 10.2 (L) 13.0 - 17.0 g/dL   Sample type VENOUS   I-Stat Lactic Acid, ED     Status: None   Collection Time: 07/19/23  9:33 AM  Result Value Ref Range   Lactic Acid, Venous 1.7 0.5 - 1.9 mmol/L  Resp panel by RT-PCR (RSV, Flu A&B, Covid) Anterior Nasal Swab     Status: None   Collection Time: 07/19/23  9:45 AM   Specimen: Anterior Nasal Swab  Result Value Ref Range   SARS Coronavirus 2 by RT PCR NEGATIVE NEGATIVE   Influenza A by PCR NEGATIVE NEGATIVE   Influenza B by PCR NEGATIVE NEGATIVE    Comment: (NOTE) The Xpert Xpress SARS-CoV-2/FLU/RSV plus assay is intended as an aid in the diagnosis of  influenza from Nasopharyngeal swab specimens and should not be used as a sole basis for treatment. Nasal washings and aspirates are unacceptable for Xpert Xpress SARS-CoV-2/FLU/RSV testing.  Fact Sheet for Patients: BloggerCourse.com  Fact Sheet for Healthcare Providers: SeriousBroker.it  This test is not yet approved or cleared by the [place] FDA and has been authorized for detection and/or diagnosis of SARS-CoV-2 by FDA under an Emergency Use Authorization (EUA). This EUA will remain in effect (meaning this test can be used) for the duration of the COVID-19 declaration under Section 564(b)(1) of the Act, 21 U.S.C. section 360bbb-3(b)(1), unless the authorization is terminated or revoked.     Resp Syncytial Virus by PCR NEGATIVE NEGATIVE    Comment: (NOTE) Fact Sheet for Patients: BloggerCourse.com  Fact Sheet for Healthcare Providers: SeriousBroker.it  This test is not yet approved or cleared by the [place] FDA and has been authorized for detection and/or diagnosis of SARS-CoV-2 by FDA under an Emergency Use Authorization (EUA). This EUA will remain in effect (meaning this test can be used) for the duration of the COVID-19 declaration under Section 564(b)(1) of the Act, 21 U.S.C. section 360bbb-3(b)(1), unless the authorization is terminated or revoked.  Performed at [facility], [address] [place], [place] [address]   Urinalysis, w/ Reflex to Culture (Infection Suspected) -Urine, Clean Catch     Status: Abnormal   Collection Time: 07/19/23 11:30 AM  Result Value Ref Range   Specimen Source URINE, CLEAN CATCH    Color, Urine YELLOW YELLOW   APPearance CLEAR CLEAR   Specific Gravity, Urine >1.046 (H) 1.005 - 1.030   pH 8.0 5.0 - 8.0   Glucose, UA NEGATIVE NEGATIVE mg/dL   Hgb urine dipstick NEGATIVE NEGATIVE   Bilirubin Urine NEGATIVE NEGATIVE    Ketones, ur 5 (A) NEGATIVE mg/dL   Protein, ur 30 (A) NEGATIVE mg/dL   Nitrite NEGATIVE NEGATIVE   Leukocytes,Ua NEGATIVE NEGATIVE   RBC / HPF 0-5 0 - 5 RBC/hpf   WBC, UA 0-5 0 - 5 WBC/hpf    Comment:        Reflex urine culture not performed if WBC <=10, OR if Squamous epithelial cells >5. If Squamous epithelial cells >5 suggest recollection.    Bacteria, UA NONE SEEN NONE SEEN   Squamous Epithelial / HPF 0-5 0 - 5 /HPF    Comment: Performed at [facility], [address] [place], [place] [address]  Troponin I (High Sensitivity)     Status: None   Collection Time: 07/19/23 11:30 AM  Result Value Ref Range   Troponin I (High Sensitivity) 4 <18 ng/L    Comment: (NOTE) Elevated high sensitivity troponin I (hsTnI) values and significant  changes across serial measurements may suggest ACS but many other  chronic and acute conditions are known to elevate hsTnI results.  Refer to the "Links" section for chest pain algorithms and additional  guidance. Performed at [facility], [address], [place], [place] [address]    CT ABDOMEN PELVIS W CONTRAST  Result Date: 07/19/2023 CLINICAL DATA:  Abdominal pain, acute, nonlocalized. EXAM: CT ABDOMEN AND PELVIS WITH CONTRAST TECHNIQUE: Multidetector CT imaging of the abdomen and pelvis was performed using the standard protocol following bolus administration of intravenous contrast. RADIATION DOSE REDUCTION: This exam was performed according to the departmental dose-optimization program which includes automated exposure control, adjustment of the mA and/or kV according to patient size and/or use of iterative reconstruction technique. CONTRAST:  75mL OMNIPAQUE IOHEXOL 350 MG/ML SOLN COMPARISON:  CT abdomen/pelvis dated 06/30/2023 FINDINGS: Lower chest: Please refer to the same-day CT chest for description of intrathoracic findings. Hepatobiliary: No focal liver abnormality is seen. Status post cholecystectomy. No biliary dilatation.  Pancreas: Unremarkable. No pancreatic ductal dilatation or surrounding inflammatory changes. Spleen: Normal in size without focal abnormality. Adrenals/Urinary Tract: Adrenal glands are unremarkable. Stable appearance of the kidneys including a densely calcified cyst at the superior pole of the left kidney, for which no follow-up imaging is recommended. No renal calculi or hydronephrosis. The bladder is decompressed with a suprapubic catheter in place. Stomach/Bowel: Stable postoperative changes of the stomach related to prior gastrocolic fistula repair at the level of the gastric body. No evidence of recurrent fistula. The appendix is normal. Stable left lower quadrant colostomy. No evidence of obstruction. Colonic diverticulosis without evidence of acute diverticulitis. No abdominopelvic ascites. No intraperitoneal free air. Vascular/Lymphatic: Aortic atherosclerosis. No enlarged abdominal or pelvic lymph nodes. Reproductive: Prostate is unremarkable. Other: Similar appearance of a right posterior ischial decubitus ulcer which extends to the level of the right ischial tuberosity with surrounding soft tissue thickening. Chronic appearing sclerotic changes of the underlying right ischial tuberosity without definite areas of acute osteolysis identified. Similar cutaneous thickening overlying the sacrum and left ischial tuberosity. Postoperative changes along the ventral abdominal wall. Musculoskeletal: No acute osseous abnormality. Stable fusion of the L4-L5 vertebral bodies. IMPRESSION: 1. No acute localizing findings in the abdomen or pelvis. 2. Stable postoperative changes of the stomach related to prior gastrocolic fistula repair without evidence of recurrent fistula. 3. Similar appearance of a right posterior ischial decubitus ulcer with chronic sclerotic changes of the underlying right ischial tuberosity. No definite acute osteolysis is identified. 4. Stable left lower quadrant colostomy. 5. Stable suprapubic  catheter. 6.  Aortic Atherosclerosis (ICD10-I70.0). 7. Additional unchanged ancillary findings as described above. Electronically Signed   By: Hart Robinsons M.D.   On: 07/19/2023 13:22   CT Angio Chest PE W and/or Wo Contrast  Result Date: 07/19/2023 CLINICAL DATA:  Pulmonary embolism (PE) suspected, high prob. Weakness. Bradycardia. EXAM: CT ANGIOGRAPHY CHEST WITH CONTRAST TECHNIQUE: Multidetector CT imaging of the chest was performed using the standard protocol during bolus administration of intravenous contrast. Multiplanar CT image reconstructions and MIPs were obtained to evaluate the vascular anatomy. RADIATION DOSE REDUCTION: This exam was performed according to the departmental dose-optimization program which includes automated  exposure control, adjustment of the mA and/or kV according to patient size and/or use of iterative reconstruction technique. CONTRAST:  75mL OMNIPAQUE IOHEXOL 350 MG/ML SOLN COMPARISON:  CTA chest dated November 01, 2021. FINDINGS: Cardiovascular: Satisfactory opacification of the pulmonary arteries to the segmental level. No evidence of pulmonary embolism. Heart size is borderline enlarged. Multivessel coronary artery calcifications. Atherosclerotic calcifications of the thoracic aorta and arch branch vessels. There is a small pericardial effusion. Right-sided PICC tip at the level of the superior cavoatrial junction. Mediastinum/Nodes: No enlarged mediastinal, hilar, or axillary lymph nodes. Thyroid gland, trachea, and esophagus demonstrate no significant findings. Lungs/Pleura: Moderate-sized left-greater-than-right pleural effusions with basilar atelectasis. Consolidative opacities in the posterior left lower lung base. No pneumothorax. Upper Abdomen: Please refer to the same-day CT abdomen/pelvis for description of intra-abdominal findings. Musculoskeletal: No acute osseous abnormality. No suspicious osseous lesion. Review of the MIP images confirms the above findings.  IMPRESSION: 1. No evidence of acute pulmonary embolism. 2. Moderate-sized left-greater-than-right pleural effusions with basilar atelectasis. Consolidative opacities in the posterior left lower lung base may represent atelectasis, however, an underlying infiltrate or mass can not be excluded. 3. Small pericardial effusion. 4.  Aortic Atherosclerosis (ICD10-I70.0). Electronically Signed   By: Hart Robinsons M.D.   On: 07/19/2023 12:57   CT HEAD WO CONTRAST  Result Date: 07/19/2023 CLINICAL DATA:  Provided history: Mental status change, unknown cause. Polytrauma, blunt. Additional history provided: Generalized weakness, bradycardia, neck pain, headache, nausea, recent sepsis. EXAM: CT HEAD WITHOUT CONTRAST CT CERVICAL SPINE WITHOUT CONTRAST TECHNIQUE: Multidetector CT imaging of the head and cervical spine was performed following the standard protocol without intravenous contrast. Multiplanar CT image reconstructions of the cervical spine were also generated. RADIATION DOSE REDUCTION: This exam was performed according to the departmental dose-optimization program which includes automated exposure control, adjustment of the mA and/or kV according to patient size and/or use of iterative reconstruction technique. COMPARISON:  Head CT 03/07/2023.  Cervical spine MRI 05/25/2020. FINDINGS: CT HEAD FINDINGS Brain: Generalized cerebral atrophy. Prominence perivascular spaces within the inferior basal ganglia, bilaterally. Apparent prominence of the retrocerebellar CSF space at midline and to the right, unchanged from the prior head CT of 03/07/2023. This may reflect a mega cisterna magna or an arachnoid cyst. There is no acute intracranial hemorrhage. No demarcated cortical infarct. No evidence of an intracranial mass. No midline shift. Vascular: No hyperdense vessel.  Atherosclerotic calcifications. Skull: No calvarial fracture or aggressive osseous lesion. Sinuses/Orbits: No mass or acute finding within the imaged  orbits. Trace mucosal thickening within the right maxillary and bilateral ethmoid sinuses at the imaged levels. Other: Trace fluid within the right mastoid air cells. CT CERVICAL SPINE FINDINGS Alignment: No significant spondylolisthesis. Skull base and vertebrae: The basion-dental and atlanto-dental intervals are maintained.No evidence of acute fracture to the cervical spine. Soft tissues and spinal canal: No prevertebral fluid or swelling. No visible canal hematoma. Disc levels: Cervical spondylosis with multilevel disc space narrowing, disc bulges/central disc protrusions, uncovertebral hypertrophy and facet arthrosis. Disc space narrowing is greatest at C6-C7 and C7-T1 (moderate to advanced at these levels). Posterior disc osteophyte complex at C5-C6. Mild ossification of the posterior longitudinal ligament at the C2 level. No appreciable high-grade spinal canal stenosis. Multilevel bony neural foraminal narrowing. Multilevel ventral osteophytes, most prominent at C5-C6 and C6-C7. Upper chest: Partially imaged bilateral pleural effusions. Bilaterally. IMPRESSION: CT head: 1. No evidence of an acute intracranial abnormality. 2. Cerebral atrophy. CT cervical spine: 1. No evidence of an acute fracture to the cervical  spine. 2. Cervical spondylosis and ossification of the posterior longitudinal ligament as described. 3. Partially imaged bilateral pleural effusions. Electronically Signed   By: Jackey Loge D.O.   On: 07/19/2023 12:38   CT CERVICAL SPINE WO CONTRAST  Result Date: 07/19/2023 CLINICAL DATA:  Provided history: Mental status change, unknown cause. Polytrauma, blunt. Additional history provided: Generalized weakness, bradycardia, neck pain, headache, nausea, recent sepsis. EXAM: CT HEAD WITHOUT CONTRAST CT CERVICAL SPINE WITHOUT CONTRAST TECHNIQUE: Multidetector CT imaging of the head and cervical spine was performed following the standard protocol without intravenous contrast. Multiplanar CT image  reconstructions of the cervical spine were also generated. RADIATION DOSE REDUCTION: This exam was performed according to the departmental dose-optimization program which includes automated exposure control, adjustment of the mA and/or kV according to patient size and/or use of iterative reconstruction technique. COMPARISON:  Head CT 03/07/2023.  Cervical spine MRI 05/25/2020. FINDINGS: CT HEAD FINDINGS Brain: Generalized cerebral atrophy. Prominence perivascular spaces within the inferior basal ganglia, bilaterally. Apparent prominence of the retrocerebellar CSF space at midline and to the right, unchanged from the prior head CT of 03/07/2023. This may reflect a mega cisterna magna or an arachnoid cyst. There is no acute intracranial hemorrhage. No demarcated cortical infarct. No evidence of an intracranial mass. No midline shift. Vascular: No hyperdense vessel.  Atherosclerotic calcifications. Skull: No calvarial fracture or aggressive osseous lesion. Sinuses/Orbits: No mass or acute finding within the imaged orbits. Trace mucosal thickening within the right maxillary and bilateral ethmoid sinuses at the imaged levels. Other: Trace fluid within the right mastoid air cells. CT CERVICAL SPINE FINDINGS Alignment: No significant spondylolisthesis. Skull base and vertebrae: The basion-dental and atlanto-dental intervals are maintained.No evidence of acute fracture to the cervical spine. Soft tissues and spinal canal: No prevertebral fluid or swelling. No visible canal hematoma. Disc levels: Cervical spondylosis with multilevel disc space narrowing, disc bulges/central disc protrusions, uncovertebral hypertrophy and facet arthrosis. Disc space narrowing is greatest at C6-C7 and C7-T1 (moderate to advanced at these levels). Posterior disc osteophyte complex at C5-C6. Mild ossification of the posterior longitudinal ligament at the C2 level. No appreciable high-grade spinal canal stenosis. Multilevel bony neural foraminal  narrowing. Multilevel ventral osteophytes, most prominent at C5-C6 and C6-C7. Upper chest: Partially imaged bilateral pleural effusions. Bilaterally. IMPRESSION: CT head: 1. No evidence of an acute intracranial abnormality. 2. Cerebral atrophy. CT cervical spine: 1. No evidence of an acute fracture to the cervical spine. 2. Cervical spondylosis and ossification of the posterior longitudinal ligament as described. 3. Partially imaged bilateral pleural effusions. Electronically Signed   By: Jackey Loge D.O.   On: 07/19/2023 12:38   DG Chest Port 1 View  Result Date: 07/19/2023 CLINICAL DATA:  Altered mental status EXAM: PORTABLE CHEST 1 VIEW COMPARISON:  07/15/2023 x-ray FINDINGS: Right-sided PICC in place with tip at the SVC right atrial junction. Overlapping cardiac leads. Stable cardiopericardial silhouette with some vascular congestion and interstitial changes. Small effusions. No pneumothorax. Degenerative changes. Calcified aorta. IMPRESSION: No significant interval change compared to previous. Stable appearance of the right-sided PICC Electronically Signed   By: Karen Kays M.D.   On: 07/19/2023 10:06    Pending Labs Unresulted Labs (From admission, onward)     Start     Ordered   07/19/23 0855  Blood Culture (Routine X 2)  BLOOD CULTURE X 2,   R      07/19/23 0855            Vitals/Pain Today's Vitals   07/19/23  1230 07/19/23 1245 07/19/23 1330 07/19/23 1400  BP: (!) 134/51 (!) 145/53 (!) 121/50 (!) 162/51  Pulse:  (!) 52 (!) 57 (!) 51  Resp: 18 18 16 17   Temp:      TempSrc:      SpO2:  99% 95% 96%    Isolation Precautions No active isolations  Medications Medications  iohexol (OMNIPAQUE) 350 MG/ML injection 75 mL (has no administration in time range)  ondansetron (ZOFRAN) injection 4 mg (4 mg Intravenous Given 07/19/23 0923)  alteplase (CATHFLO ACTIVASE) injection 2 mg (2 mg Intracatheter Given 07/19/23 1155)  iohexol (OMNIPAQUE) 350 MG/ML injection 75 mL (75 mLs  Intravenous Contrast Given 07/19/23 1043)  furosemide (LASIX) injection 40 mg (40 mg Intravenous Given 07/19/23 1355)    Mobility non-ambulatory     Focused Assessments Weakness/ sepsis workup    R Recommendations: See Admitting Provider Note  Report given to:   Additional Notes: Family at bedside, answers questions but is confused   '

## 2023-07-19 NOTE — ED Triage Notes (Signed)
Bib ems from [facility] with reports of generalized weakness and bradycardia onset today. Took morning dose of atenolol. Pt with HR 40-50's. VSS with ems. Pt pale on arrival complaining of neck pain, headache and nausea. Pt recently admitted for sepsis.

## 2023-07-19 NOTE — H&P (Signed)
History and Physical    Patient: Brent [name]. AVW:[id number] DOB: [birth year] DOA: 07/19/2023 DOS: the patient was seen and examined on 07/19/2023 PCP: [name], MD  Patient coming from: SNF via EMS  Chief Complaint:  Chief Complaint  Patient presents with   Weakness   HPI: Brent [name]. is a 77 y.o. male with medical history significant of quadriparesis and quadriplegia due to transverse myelitis, GI fistula s/p post wedge resection 9/24, neurogenic bladder s/p suprapubic catheter, stage IV decubitus ulcer, insulin-dependent DM type II, asthma who presents with complaints of malaise.  History is obtained with the assistance of the patient's wife who is present at bedside.  Apparently the patient called out after feeling poorly and was noted to have heart rates as low as 46 stating that he felt like he was going to die for which the facility called EMS.  He just recently been hospitalized 10/11-10/17 for evaluation for sacral ulcer and right foot wound which revealed right foot osteomyelitis.  Patient was seen by orthopedics and underwent a right fifth ray amputation.  ID have been consulted and recommended 6 weeks of IV Zosyn in the noted to be 08/14/23 for which PICC line was placed.  After patient was rehospitalized from 10/25-10/28 with shortness of breath secondary to acute on chronic heart failure with preserved EF.  Patient was treated with IV Lasix and discharged back to the SNF on 10/28.  Since being at the facility patient reported that he still had a cough.  Patient wife notes that he has had intermittent confusion, but the Ativan does help.   Upon admission into the emergency department patient was noted to be hypothermic with pulse 45-69, blood pressures 74/51-162/51, and O2 saturation currently maintained on 2 L nasal cannula oxygen.  WBC elevated at 11.8, hemoglobin 9.4, glucose 142, BNP 136.4, high-sensitivity troponin negative x 2.  Chest x-ray noted stable  right sided PICC.  CT angiogram of the head and cervical spine did not note any acute abnormality.  CT angiogram of the chest abdomen pelvis noted moderate-sized left greater than right pleural effusion with basilar atelectasis, and consolidative opacities in the posterior left lung base.  Patient was given Lasix 40 mg IV x 1 dose and alteplase 2 mg IV.  Review of Systems: As mentioned in the history of present illness. All other systems reviewed and are negative. Past Medical History:  Diagnosis Date   Acute osteomyelitis of metatarsal bone of right foot (HCC) 07/01/2023   Allergy    Aortic atherosclerosis (HCC) 07/01/2023   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 angioplasty of his RCA in 1990. b. bare metal stent placed in the RCA in 2000 followed by rotational atherectomy shortly after for stent restenosis. c. last cath was in 2012 showing stable moderate diffuse CAD. (70% mid LAD, 80% diagonal, 70% Ramus, 40% mid to distal RCA stent restenosis). d. Low risk nuc in 2015.   Diabetes mellitus    Diverticulosis    Elevated CK    Erectile dysfunction    Hemorrhoids    HTN (hypertension)    Hyperlipidemia    Hypertriglyceridemia    Malignant melanoma of left side of neck (HCC) 10/25/2018   Myocardial infarction (HCC)    Obesity    OSA (obstructive sleep apnea)    Persistent disorder of initiating or maintaining sleep    Personal history of colonic polyps 02/05/2003   Renal lesion 06/20/2016  Septic arthritis of foot (HCC) 07/01/2023   Stage IV decubitus ulcer (HCC) 06/30/2023   Past Surgical History:  Procedure Laterality Date   AMPUTATION TOE Right 07/02/2023   Procedure: AMPUTATION TOE FIFTH RAY;  Surgeon: Netta Cedars, MD;  Location: [facility];  Service: Orthopedics;  Laterality: Right;   BIOPSY  03/26/2022   Procedure: BIOPSY;  Surgeon: Iva Boop, MD;  Location: [facility] ENDOSCOPY;  Service: Gastroenterology;;   CHOLECYSTECTOMY      CORONARY ANGIOPLASTY     CORONARY STENT PLACEMENT     stenting of the right coronary artery with followup rotational  atherectomy. (3 stents placed)   ENDOSCOPIC MUCOSAL RESECTION  10/13/2022   Procedure: ENDOSCOPIC MUCOSAL RESECTION;  Surgeon: Meridee Score Netty Starring., MD;  Location: [facility];  Service: Gastroenterology;;   ESOPHAGOGASTRODUODENOSCOPY (EGD) WITH PROPOFOL N/A 03/26/2022   Procedure: ESOPHAGOGASTRODUODENOSCOPY (EGD) WITH PROPOFOL;  Surgeon: Iva Boop, MD;  [facility];  Service: Gastroenterology;  Laterality: N/A;   ESOPHAGOGASTRODUODENOSCOPY (EGD) WITH PROPOFOL N/A 07/11/2022   Procedure: ESOPHAGOGASTRODUODENOSCOPY (EGD) WITH PROPOFOL;  Surgeon: Meridee Score Netty Starring., MD;  Location: [facility];  Service: Gastroenterology;  Laterality: N/A;   ESOPHAGOGASTRODUODENOSCOPY (EGD) WITH PROPOFOL N/A 10/13/2022   Procedure: ESOPHAGOGASTRODUODENOSCOPY (EGD) WITH PROPOFOL;  Surgeon: Meridee Score Netty Starring., MD;  Location: [facility];  Service: Gastroenterology;  Laterality: N/A;   EUS N/A 10/13/2022   Procedure: UPPER ENDOSCOPIC ULTRASOUND (EUS) RADIAL;  Surgeon: Lemar Lofty., MD;  Location: [facility];  Service: Gastroenterology;  Laterality: N/A;   FINGER SURGERY     right   FOOT SURGERY     right   HEMOSTASIS CLIP PLACEMENT  07/11/2022   Procedure: HEMOSTASIS CLIP PLACEMENT;  Surgeon: Meridee Score Netty Starring., MD;  Location: [facility];  Service: Gastroenterology;;  ovesco    HEMOSTASIS CLIP PLACEMENT  10/13/2022   Procedure: HEMOSTASIS CLIP PLACEMENT;  Surgeon: Lemar Lofty., MD;  Location: [facility];  Service: Gastroenterology;;   HOT HEMOSTASIS N/A 07/11/2022   Procedure: HOT HEMOSTASIS (ARGON PLASMA COAGULATION/BICAP);  Surgeon: Lemar Lofty., MD;  Location: [facility];  Service: Gastroenterology;  Laterality: N/A;   HOT HEMOSTASIS N/A 10/13/2022   Procedure: HOT HEMOSTASIS (ARGON PLASMA COAGULATION/BICAP);  Surgeon:  Lemar Lofty., MD;  Location: [facility];  Service: Gastroenterology;  Laterality: N/A;   INGUINAL HERNIA REPAIR     right   IR FLUORO PROCEDURE UNLISTED  02/21/2023   IR GASTROSTOMY TUBE REMOVAL  02/10/2022   IR REPLACE G-TUBE SIMPLE WO FLUORO  03/14/2023   IR REPLC GASTRO/COLONIC TUBE PERCUT W/FLUORO  12/22/2022   IR REPLC GASTRO/COLONIC TUBE PERCUT W/FLUORO  01/26/2023   IR REPLC GASTRO/COLONIC TUBE PERCUT W/FLUORO  03/06/2023   IR REPLC GASTRO/COLONIC TUBE PERCUT W/FLUORO  04/27/2023   IRRIGATION AND DEBRIDEMENT ABSCESS N/A 06/04/2020   Procedure: IRRIGATION AND DEBRIDEMENT SACRAL WOUND;  Surgeon: Diamantina Monks, MD;  Location: [facility] OR;  Service: General;  Laterality: N/A;   LAPAROSCOPY N/A 06/04/2020   Procedure: LAPAROSCOPY ASSISTED COLOSTOMY, LAPAROSCOPY GASTROSTOMY;  Surgeon: Diamantina Monks, MD;  Location: [facility] OR;  Service: General;  Laterality: N/A;   LAPAROSCOPY N/A 05/24/2023   Procedure: LAPAROSCOPY DIAGNOSTIC;  Surgeon: Emelia Loron, MD;  Location: [facility] OR;  Service: General;  Laterality: N/A;   LAPAROTOMY N/A 05/24/2023   Procedure: WEDGED RESECTION OF STOMACH AND GASTROCUTANEOUS FISTULA;  Surgeon: Emelia Loron, MD;  Location: [facility] OR;  Service: General;  Laterality: N/A;   LYSIS OF ADHESION N/A 06/04/2020   Procedure: LYSIS OF ADHESION;  Surgeon: Diamantina Monks,  MD;  Location: [facility] OR;  Service: General;  Laterality: N/A;   LYSIS OF ADHESION N/A 05/24/2023   Procedure: LYSIS OF ADHESION;  Surgeon: Emelia Loron, MD;  Location: [facility] OR;  Service: General;  Laterality: N/A;   melanoma removal     neck   ORTHOPEDIC SURGERY     foot right   POLYPECTOMY  10/13/2022   Procedure: POLYPECTOMY;  Surgeon: Lemar Lofty., MD;  Location: [facility];  Service: Gastroenterology;;   rotator cuff surg     Bil   STENT REMOVAL  10/13/2022   Procedure: CLIP REMOVAL;  Surgeon: Lemar Lofty., MD;  Location: [facility];  Service: Gastroenterology;;   Brooke Dare INJECTION  10/13/2022   Procedure: SUBMUCOSAL LIFTING INJECTION;  Surgeon: Lemar Lofty., MD;  Location: [facility];  Service: Gastroenterology;;   SUBMUCOSAL TATTOO INJECTION  10/13/2022   Procedure: SUBMUCOSAL TATTOO INJECTION;  Surgeon: Lemar Lofty., MD;  Location: [facility];  Service: Gastroenterology;;   Social History:  reports that he quit smoking about 42 years ago. His smoking use included cigarettes. He started smoking about 72 years ago. He has a 45 pack-year smoking history. He has never used smokeless tobacco. He reports that he does not currently use alcohol after a past usage of about 7.0 - 14.0 standard drinks of alcohol per week. He reports that he does not use drugs.  Allergies  Allergen Reactions   Cipro [Ciprofloxacin Hcl] Other (See Comments)    Hallucinations Allergy not listed on MAR   Neurontin [Gabapentin] Other (See Comments)    Unknown reaction   Levaquin [Levofloxacin] Rash    Family History  Problem Relation Age of Onset   Lung cancer Mother    COPD Mother    Obesity Mother    Liver cancer Father    Lung cancer Father    Heart disease Father    Heart disease Sister    Colon cancer Neg Hx    Stomach cancer Neg Hx    Pancreatic cancer Neg Hx    Esophageal cancer Neg Hx    Inflammatory bowel disease Neg Hx    Liver disease Neg Hx    Rectal cancer Neg Hx     Prior to Admission medications   Medication Sig Start Date End Date Taking? Authorizing Provider  acetaminophen (TYLENOL) 500 MG tablet Take 2 tablets (1,000 mg total) by mouth every 6 (six) hours as needed for mild pain (or Fever >/= 101). 05/31/23  Yes Rhetta Mura, MD  atenolol (TENORMIN) 25 MG tablet Take 25 mg by mouth at bedtime.   Yes [provider]  atenolol (TENORMIN) 25 MG tablet Take 12.5 mg by mouth in the morning.   Yes [provider]  atorvastatin (LIPITOR) 40 MG tablet Take 1 tablet (40 mg total) by mouth at bedtime. 05/31/23   Yes Rhetta Mura, MD  baclofen (LIORESAL) 20 MG tablet Take 1 tablet (20 mg total) by mouth 3 (three) times daily. 06/06/23  Yes Amin, Ankit C, MD  benzonatate (TESSALON) 100 MG capsule Take 100 mg by mouth every 8 (eight) hours. 07/13/23 07/20/23 Yes [provider]  cholecalciferol (VITAMIN D3) 25 MCG (1000 UNIT) tablet Take 2,000 Units by mouth daily.   Yes [provider]  DULoxetine (CYMBALTA) 30 MG capsule Take 1 capsule (30 mg total) by mouth at bedtime. 05/31/23  Yes Rhetta Mura, MD  famotidine (PEPCID) 20 MG tablet Take 20 mg by mouth 2 (two) times daily.   Yes [provider]  furosemide (LASIX) 40 MG tablet Take 1 tablet (40 mg total) by mouth daily. 07/18/23  Yes Meredeth Ide, MD  guaiFENesin (MUCINEX) 600 MG 12 hr tablet Take 600 mg by mouth 2 (two) times daily.   Yes [provider]  guaiFENesin-dextromethorphan (ROBITUSSIN DM) 100-10 MG/5ML syrup Take 10 mLs by mouth every 6 (six) hours as needed for cough.   Yes [provider]  insulin aspart (NOVOLOG) 100 UNIT/ML injection Inject 0-15 Units into the skin every 4 (four) hours. Sliding scale  CBG 70 - 120: 0 units: CBG 121 - 150: 2 units; CBG 151 - 200: 3 units; CBG 201 - 250: 5 units; CBG 251 - 300: 8 units;CBG 301 - 350: 11 units; CBG 351 - 400: 15 units; CBG > 400 : 15 units and notify MD Patient taking differently: Inject 0-15 Units into the skin See admin instructions. Inject 0-15 units before meals per sliding scale: 70-120 : 0 units 121-150 : 2 units 151-200 : 3 units 201-250 : 5 units 251-300 : 8 units 301-350 : 11 units 351-400 : 15 units > 400 : 15 units, notify MD 06/10/20  Yes Rai, Ripudeep K, MD  insulin glargine-yfgn (SEMGLEE, YFGN,) 100 UNIT/ML Pen Inject 35 Units into the skin 2 (two) times daily.   Yes [provider]  lamoTRIgine (LAMICTAL) 25 MG tablet Take 2 tablets (50 mg total) by mouth 2 (two) times daily. For 5 days. Started 02/18/23.  07/06/23  Yes Glade Lloyd, MD  LORazepam (ATIVAN) 0.5 MG tablet Take 1 tablet (0.5 mg total) by mouth daily as needed (panic attacks). 07/06/23  Yes Glade Lloyd, MD  losartan (COZAAR) 50 MG tablet Take 75 mg by mouth daily.   Yes [provider]  Menthol, Topical Analgesic, (BIOFREEZE) 4 % GEL Apply 1 application  topically 2 (two) times daily as needed (pain to back (upper and lower),arms and shoulders).   Yes [provider]  metFORMIN (GLUCOPHAGE) 1000 MG tablet Take 1,000 mg by mouth in the morning and at bedtime.   Yes [provider]  nystatin powder Apply 1 Application topically daily. Apply to peri-wound bed, sacrum and right ischium daily with routine dressing.   Yes [provider]  omeprazole (PRILOSEC) 40 MG capsule Take 1 capsule (40 mg total) by mouth 2 (two) times daily. 30 minutes before breakfast/dinner 10/13/22  Yes Mansouraty, Netty Starring., MD  ondansetron (ZOFRAN-ODT) 4 MG disintegrating tablet Take 4 mg by mouth every 8 (eight) hours as needed for nausea or vomiting.   Yes [provider]  oxyCODONE (OXY IR/ROXICODONE) 5 MG immediate release tablet Take 1 tablet (5 mg total) by mouth 2 (two) times daily as needed for breakthrough pain. 07/17/23  Yes Lama, Sarina Ill, MD  piperacillin-tazobactam (ZOSYN) IVPB Inject 3.375 g into the vein every 6 (six) hours. Indication: diabetic foot infection/osteomyelitis  First Dose: Yes Last Day of Therapy: 08/15/2023 Labs - Once weekly:  CBC/D and BMP, Labs - Once weekly: ESR and CRP 07/06/23 08/15/23 Yes Alekh, Kshitiz, MD  Polyethyl Glycol-Propyl Glycol (GENTEAL TEARS SEVERE DAY/NIGHT) 0.4-0.3 % GEL ophthalmic gel Place 1 Application into both eyes 4 (four) times daily as needed (dry eyes).   Yes [provider]  polyethylene glycol (MIRALAX / GLYCOLAX) 17 g packet Take 17 g by mouth daily as needed for mild constipation. 07/06/23  Yes Glade Lloyd, MD  potassium chloride SA (KLOR-CON  M) 20 MEQ tablet Take 1 tablet (20 mEq total) by mouth daily. Take  along with Lasix 07/17/23  Yes Lama, Sarina Ill, MD  riTUXimab (RITUXAN IV) Inject 10 mg into the vein once. Between the 12th-13th of June, December.   Yes [provider]  senna-docusate (SENOKOT-S) 8.6-50 MG tablet Take 1 tablet by mouth 2 (two) times daily.   Yes [provider]  spironolactone (ALDACTONE) 25 MG tablet Take 25 mg by mouth daily.   Yes [provider]  traZODone (DESYREL) 100 MG tablet Take 100 mg by mouth at bedtime.   Yes [provider]  vitamin C (ASCORBIC ACID) 500 MG tablet Take 500 mg by mouth daily.   Yes [provider]  zinc sulfate 220 (50 Zn) MG capsule Take 1 capsule (220 mg total) by mouth daily. 07/07/23  Yes Glade Lloyd, MD    Physical Exam: Vitals:   07/19/23 1230 07/19/23 1245 07/19/23 1330 07/19/23 1400  BP: (!) 134/51 (!) 145/53 (!) 121/50 (!) 162/51  Pulse:  (!) 52 (!) 57 (!) 51  Resp: 18 18 16 17   Temp:      TempSrc:      SpO2:  99% 95% 96%    Constitutional: Elderly male currently in some respiratory distress Eyes: PERRL, lids and conjunctivae normal ENMT: Mucous membranes are moist.  Fair dentition. Neck: normal, supple,  JVD Respiratory: Normal respiratory effort with expiratory wheezes appreciated. Cardiovascular: Regular rate and rhythm, no murmurs / rubs / gallops.  2+ pitting bilateral lower extremity edema.   PICC line in the right upper extremity. Abdomen: no tenderness, no masses palpated.  Suprapubic catheter in place.  Colostomy present of the left lower quadrant of the abdomen. Musculoskeletal: no clubbing / cyanosis.  Right fifth toe amputation stitches in place without signs of erythema. Skin: Stage IV sacral/ischial  ulcer present. Neurologic: CN 2-12 grossly intact.  Paraplegic.  Limited right upper extremity mobility.  Psychiatric: Normal mood.  Alert and oriented to person and place.  Data Reviewed:  Reviewed  labs, imaging, and pertinent records as documented.   Assessment and Plan:  Pleural effusion Heart failure with preserved ejection fraction Acute on chronic.  Patient reports continued cough.  O2 saturations were maintained on 2 L of nasal cannula oxygen.  CT scan of the chest noted moderate-sized left greater than right pleural effusion with basilar atelectasis, and consolidative opacities in the posterior left lung base. Marland Kitchen  BNP was elevated at 136.4.  Last echocardiogram 07/15/23 noted EF to be 60 to 65% with normal diastolic parameters.  Patient had been given Lasix 40 mg IV.  Lower suspicion for infection at this time as patient has been on antibiotics and white blood cell count within normal limits.. -Admit to a cardiac telemetry bed -Strict intake and output and daily weights -Continuous pulse oximetry with nasal cannula oxygen to maintain O2 saturations greater than 90%. -Add-on procalcitonin   -Continue Lasix to 40 mg IV twice daily -Follow-up echocardiogram in formally consult cardiology if significant change in heart function noted.   Bradycardia Transient hypotension Heart rates noted to be in the 40-50s upon admission with initial blood pressure reported to be as low as 74/51.  Blood pressures thereafter were maintained.  Home medication regimen includes atenolol 12.5 mg in a.m., atenolol 25 mg nightly, losartan 75 mg, and spironolactone 25 mg daily -Hold atenolol.  Consider adjustment of dose -Continue all other blood pressure medications as tolerated  Leukocytosis Acute.  WBC elevated at 11.8.  MRSA screening from last hospitalization on 10/25 was noted to be positive. -Follow-up blood cultures -  Continue Zosyn. Consider consult to ID if needed in a.m.  History of osteomyelitis of the right fifth metatarsal Septic arthritis of fifth MTP joint Patient s/p fifth ray amputation on 10/13 by orthopedics after MRI showed fifth MTP joint septic arthritis and osteomyelitis of the  fifth metatarsal and fifth proximal phalanx. OR cultures growing Providencia/Proteus/E faecalis/Bacteroides.  Scheduled to be on 6 weeks of IV Zosyn per ID until 08/14/2023.  Right upper extremity PICC in place. -Continue Zosyn   Diabetes mellitus type 2, with long-term use of insulin On admission glucose noted to be 112.  Last hemoglobin A1c was 7.5 on 06/06/2023.  Home medication regimen includes Semglee -Hypoglycemic protocols -Hold metformin -Pharmacy substitution of Semglee at 24 units twice daily -CBGs before every meal with moderate SSI -Adjust insulin regimen as medically appropriate.   Essential hypertension On admission blood pressures elevated up to 183/60. -Continue losartan and spironolactone   Anemia of chronic disease Hemoglobin 9.5 g/dL which appears slightly improved from prior hospitalization of 8.2 on 10/17. -Recheck CBC in a.m.  Stage IV decubitus ulcer Ischial decubitus ulcer Patient has chronic ulcers for which previous CT imaging imaging from 10/11 showed stable ulcer with no fluid collection . -Low-air-loss mattress replacement -Continue prior wound care orders from recent hospitalization as noted below: Cleanse entire sacrum/buttocks with Vashe [name] [phone]) wound cleanser, apply Vashe moistened gauze to open areas sacrum, buttocks and R ischium. Cover R ischial wound with dry gauze and silicone foam.  Coat surrounding skin of buttocks with Gerhardt's butt cream 3 times daily and as needed for soiling.  May cover with ABD pad (would not use silicone foam to sacrum/buttocks as this will hold moisture onto skin).  Paraplegia secondary to transverse myelitis Neurogenic bladder s/p suprapubic catheter -Assistance with feeding -Continue baclofen  History of gastrocolic fistula S/p colostomy -Continue colostomy care  Hyperlipidemia -Continue atorvastatin   Depression -Continue Cymbalta  GERD -Continue pharmacy substitution of Protonix     Advance  Care Planning:   Code Status: Full Code   Consults: PCCM  Family Communication: Wife updated at bedside  Severity of Illness: The appropriate patient status for this patient is INPATIENT. Inpatient status is judged to be reasonable and necessary in order to provide the required intensity of service to ensure the patient's safety. The patient's presenting symptoms, physical exam findings, and initial radiographic and laboratory data in the context of their chronic comorbidities is felt to place them at high risk for further clinical deterioration. Furthermore, it is not anticipated that the patient will be medically stable for discharge from the hospital within 2 midnights of admission.   * I certify that at the point of admission it is my clinical judgment that the patient will require inpatient hospital care spanning beyond 2 midnights from the point of admission due to high intensity of service, high risk for further deterioration and high frequency of surveillance required.*  Author: Clydie Braun, MD 07/19/2023 3:07 PM  For on call review [email or web address].

## 2023-07-19 NOTE — ED Notes (Signed)
Bair hugger applied.

## 2023-07-20 ENCOUNTER — Inpatient Hospital Stay (HOSPITAL_COMMUNITY): Payer: Medicare Other

## 2023-07-20 DIAGNOSIS — J90 Pleural effusion, not elsewhere classified: Secondary | ICD-10-CM | POA: Diagnosis not present

## 2023-07-20 LAB — GLUCOSE, CAPILLARY
Glucose-Capillary: 160 mg/dL — ABNORMAL HIGH (ref 70–99)
Glucose-Capillary: 109 mg/dL — ABNORMAL HIGH (ref 70–99)
Glucose-Capillary: 149 mg/dL — ABNORMAL HIGH (ref 70–99)
Glucose-Capillary: 193 mg/dL — ABNORMAL HIGH (ref 70–99)

## 2023-07-20 LAB — BASIC METABOLIC PANEL
Anion gap: 11 (ref 5–15)
BUN: 10 mg/dL (ref 8–23)
CO2: 30 mmol/L (ref 22–32)
Calcium: 8.6 mg/dL — ABNORMAL LOW (ref 8.9–10.3)
Chloride: 95 mmol/L — ABNORMAL LOW (ref 98–111)
Creatinine, Ser: 0.84 mg/dL (ref 0.61–1.24)
GFR, Estimated: >60 mL/min (ref >60)
Glucose, Bld: 161 mg/dL — ABNORMAL HIGH (ref 70–99)
Potassium: 3.4 mmol/L — ABNORMAL LOW (ref 3.5–5.1)
Sodium: 136 mmol/L (ref 135–145)

## 2023-07-20 LAB — TROPONIN I (HIGH SENSITIVITY)
Troponin I (High Sensitivity): 4 ng/L (ref <18)
Troponin I (High Sensitivity): 7 ng/L (ref <18)

## 2023-07-20 LAB — CBC
HCT: 30.8 % — ABNORMAL LOW (ref 39.0–52.0)
Hemoglobin: 9.1 g/dL — ABNORMAL LOW (ref 13.0–17.0)
MCH: 24.6 pg — ABNORMAL LOW (ref 26.0–34.0)
MCHC: 29.5 g/dL — ABNORMAL LOW (ref 30.0–36.0)
MCV: 83.2 fL (ref 80.0–100.0)
Platelets: 377 10*3/uL (ref 150–400)
RBC: 3.7 MIL/uL — ABNORMAL LOW (ref 4.22–5.81)
RDW: 17.1 % — ABNORMAL HIGH (ref 11.5–15.5)
WBC: 9.3 10*3/uL (ref 4.0–10.5)
nRBC: 0 % (ref 0.0–0.2)

## 2023-07-20 MED ORDER — GERHARDT'S BUTT CREAM
TOPICAL_CREAM | Freq: Three times a day (TID) | CUTANEOUS | Status: DC
  Filled 2023-07-20: qty 1

## 2023-07-20 MED ORDER — GERHARDT'S BUTT CREAM
1.0000 | TOPICAL_CREAM | Freq: Every day | CUTANEOUS | Status: DC
  Administered 2023-07-20: 1 via TOPICAL
  Filled 2023-07-20: qty 1

## 2023-07-20 MED ORDER — MEDIHONEY WOUND/BURN DRESSING EX PSTE
1.0000 | PASTE | Freq: Every day | CUTANEOUS | Status: DC
  Administered 2023-07-20 – 2023-07-27 (×8): 1 via TOPICAL
  Filled 2023-07-20 (×3): qty 44

## 2023-07-20 MED ORDER — POTASSIUM CHLORIDE CRYS ER 20 MEQ PO TBCR
40.0000 meq | EXTENDED_RELEASE_TABLET | ORAL | Status: AC
  Administered 2023-07-20 (×2): 40 meq via ORAL
  Filled 2023-07-20 (×2): qty 2

## 2023-07-20 NOTE — Consult Note (Addendum)
WOC Nurse Consult Note: this patient is well known to WOC team from previous admissions; last at [facility] DC 07/06/2023; had amputation R 5th toe 07/02/2023  Reason for Consult: wounds, ostomy  Wound type: 1.  Full thickness post amputation R 5th toe/R plantar foot  2.  Chronic Stage 4 Pressure Injury Sacrum and R ischium   Pressure Injury POA: Yes Measurement: 1.  R 5th toe/plantar foot 6 cm x 3 cm incision with intact sutures  2.  R ischium Stage 4 PI 3 cm x 2 cm x 1 cm 50% yellow slough 50% pink moist  3.  Sacrum Stage 4 PI 7 cm x 7 cm x 0.1 cm 50% pink moist 50% yellow  Wound bed: as above  Drainage (amount, consistency, odor) minimal tan drainage from ischium/sacrum  Periwound: erythema and chronic tissue damage to buttocks/coccyx/sacrum  Dressing procedure/placement/frequency:  Clean plantar foot intact sutures with NS, apply Xeroform to incision site daily, cover with dry gauze or silicone foam.  Clean Sacral and Ischial wounds with Vashe wound cleanser [name] [phone]), apply Medihoney to wound beds daily, cover with dry gauze.  Apply Gerhardt's Butt Cream to surrounding skin of sacrum/coccyx/buttocks 3 times daily and prn soiling. May cover with ABD pad or silicone foams whichever is preferred.   Patient should be placed on a low air loss mattress for pressure redistribution and moisture management.   WOC Nurse ostomy consult note; patient with established ostomy  Stoma type/location: LLQ colostomy  Stomal assessment/size: did not remove intact pouch at this visit, was 1 3/4" per last evaluation  Peristomal assessment:  not assessed  Treatment options for stomal/peristomal skin: 2" barrier ring  Output small amount of brown stool  Ostomy pouching:  2 piece 2 3/4" skin barrier [name] #2), 2 3/4" pouch [name] [phone]) and 2" barrier ring [name] [phone])    Education provided: none, patient resides at snf  Enrolled patient in [employer] DC program: no established ostomy   WOC  team will not follow this ostomy.  Orders placed with [name] numbers.   Thank you,    Priscella Mann MSN, RN-BC, [employer] [phone]

## 2023-07-20 NOTE — Progress Notes (Signed)
PROGRESS NOTE    Brent [name].  WFU:[id number] DOB: 1945/12/03 DOA: 07/19/2023 PCP: [name], MD  Chief Complaint  Patient presents with   Weakness    Brief Narrative:   Brent [name]. is Brent [name] 77 y.o. male with medical history significant of quadriparesis and quadriplegia due to transverse myelitis, GI fistula s/p post wedge resection 9/24, neurogenic bladder s/p suprapubic catheter, stage IV decubitus ulcer, insulin-dependent DM type II, asthma who presents with complaints of malaise.  History is obtained with the assistance of the patient's wife who is present at bedside.  Apparently the patient called out after feeling poorly and was noted to have heart rates as low as 46 stating that he felt like he was going to die for which the facility called EMS.  He just recently been hospitalized 10/11-10/17 for evaluation for sacral ulcer and right foot wound which revealed right foot osteomyelitis.  Patient was seen by orthopedics and underwent Dorothea Yow right fifth ray amputation.  ID have been consulted and recommended 6 weeks of IV Zosyn in the noted to be 08/14/23 for which PICC line was placed.  After patient was rehospitalized from 10/25-10/28 with shortness of breath secondary to acute on chronic heart failure with preserved EF.  Patient was treated with IV Lasix and discharged back to the SNF on 10/28.  Since being at the facility patient reported that he still had Marques Ericson cough.  Patient wife notes that he has had intermittent confusion, but the Ativan does help.     Upon admission into the emergency department patient was noted to be hypothermic with pulse 45-69, blood pressures 74/51-162/51, and O2 saturation currently maintained on 2 L nasal cannula oxygen.  WBC elevated at 11.8, hemoglobin 9.4, glucose 142, BNP 136.4, high-sensitivity troponin negative x 2.  Chest x-ray noted stable right sided PICC.  CT angiogram of the head and cervical spine did not note any acute abnormality.  CT  angiogram of the chest abdomen pelvis noted moderate-sized left greater than right pleural effusion with basilar atelectasis, and consolidative opacities in the posterior left lung base.  Patient was given Lasix 40 mg IV x 1 dose and alteplase 2 mg IV.  Assessment & Plan:   Principal Problem:   Pleural effusion Active Problems:   (HFpEF) heart failure with preserved ejection fraction (HCC)   History of osteomyelitis   Leukocytosis   Sinus bradycardia   Hyperlipidemia   DEPRESSION, SITUATIONAL, ACUTE   Essential hypertension   Paraplegia (HCC)   Controlled type 2 diabetes mellitus with hyperglycemia, with long-term current use of insulin (HCC)   Transverse myelitis (HCC)   Stage IV pressure ulcer of sacral region [facility])   External gastrointestinal fistula site infection   Normocytic anemia   GERD (gastroesophageal reflux disease)  Pleural effusion Heart failure with preserved ejection fraction Acute on chronic.  CT scan of the chest noted moderate-sized left greater than right pleural effusion with basilar atelectasis, and consolidative opacities in the posterior left lung base.  BNP was elevated at 136.4.  Procal <0.1 Last echocardiogram 07/15/23 noted EF to be 60 to 65% with normal diastolic parameters.   Appreciate pulm recs - admission for IV diuresis, no role for thora, low salt diet, optimizing oral meds prior to discharge Consider cards c/s (will hold off on repeat echo given one done recently) Continue lasix BID Strict I/O, daily weights  Bradycardia Transient hypotension Heart rates noted to be in the 40-50s upon admission with initial blood pressure reported  to be as low as 74/51.   Hold antihypertensives and AV nodal blockers (atenolol, losratan, spironolactone on hold) Huston Foley noted today as well as soft BP's, follow  Chest Discomfort Shortness of Breath Lightheadedness Due to above? EKG without concerning findings.  Trop negative.  CXR pending.  Treating volume  overload as above Additional w/u if symptoms persistent/not improving  Leukocytosis Improved, follow    History of osteomyelitis of the right fifth metatarsal Septic arthritis of fifth MTP joint Patient s/p fifth ray amputation on 10/13 by orthopedics after MRI showed fifth MTP joint septic arthritis and osteomyelitis of the fifth metatarsal and fifth proximal phalanx. OR cultures growing Providencia/Proteus/E faecalis/Bacteroides.  Scheduled to be on 6 weeks of IV Zosyn per ID until 08/14/2023.  Right upper extremity PICC in place. -Continue Zosyn   Diabetes mellitus type 2, with long-term use of insulin Last hemoglobin A1c was 7.5 on 06/06/2023.  Home medication regimen includes Semglee -Hold metformin -Pharmacy substitution of Semglee at 24 units twice daily -SSI    Essential hypertension -hold losartan and spironolactone (soft BP this AM noted) -follow with diuresis (blood pressures on low side today, will trend)   Anemia of chronic disease Trend Iron, b12, folate, ferritin  Stage IV decubitus ulcer Ischial decubitus ulcer Patient has chronic ulcers for which previous CT imaging imaging from 10/11 showed stable ulcer with no fluid collection . -Low-air-loss mattress replacement -appreciate wound care recs Clean plantar foot intact sutures with NS, apply Xeroform to incision site daily, cover with dry gauze or silicone foam.  Clean Sacral and Ischial wounds with Vashe wound cleanser [name] [phone]), apply Medihoney to wound beds daily, cover with dry gauze.  Apply Gerhardt's Butt Cream to surrounding skin of sacrum/coccyx/buttocks 3 times daily and prn soiling. May cover with ABD pad or silicone foams whichever is preferred.    Paraplegia secondary to transverse myelitis Neurogenic bladder s/p suprapubic catheter -Assistance with feeding -Continue baclofen   History of gastrocolic fistula S/p colostomy -Continue colostomy care   Hyperlipidemia -Continue atorvastatin    Depression -Continue Cymbalta   GERD -Continue pharmacy substitution of Protonix      DVT prophylaxis: lovenox Code Status: full Family Communication: none Disposition:   Status is: Inpatient Remains inpatient appropriate because: continued inpatient care   Consultants:  Pulmonary   Procedures:  none  Antimicrobials:  Anti-infectives (From admission, onward)    Start     Dose/Rate Route Frequency Ordered Stop   07/19/23 1800  piperacillin-tazobactam (ZOSYN) IVPB 3.375 g        3.375 g 100 mL/hr over 30 Minutes Intravenous Every 6 hours 07/19/23 1659 08/15/23 2359   07/19/23 1745  piperacillin-tazobactam (ZOSYN) IVPB  Status:  Discontinued       Note to Pharmacy: Indication: diabetic foot infection/osteomyelitis  First Dose: Yes Last Day of Therapy: 08/15/2023 Labs - Once weekly:  CBC/D and BMP, Labs - Once weekly: ESR and CRP     3.375 g Intravenous Every 6 hours 07/19/23 1652 07/19/23 1657       Subjective: C/o chest discomfort this morning LH and SOB   Objective: Vitals:   07/19/23 1958 07/20/23 0100 07/20/23 0355 07/20/23 0500  BP:  (!) 105/45 (!) 92/46   Pulse:   (!) 55   Resp:      Temp: 98.8 F (37.1 C)  98.4 F (36.9 C)   TempSrc: Oral  Axillary   SpO2:   92%   Weight:    94.2 kg  Height:  Intake/Output Summary (Last 24 hours) at 07/20/2023 1538 Last data filed at 07/20/2023 1502 Gross per 24 hour  Intake 545 ml  Output 3150 ml  Net -2605 ml   Filed Weights   07/20/23 0500  Weight: 94.2 kg    Examination:  General exam: Appears calm and comfortable  Respiratory system: unlabored, CTAB Cardiovascular system: RRR Gastrointestinal system: ostomy, suprapubic catheter - s/nt/nd Central nervous system: Bilateral LE weakness Extremities: bilateral edema, R firth toe amputation.    Data Reviewed: I have personally reviewed following labs and imaging studies  CBC: Recent Labs  Lab 07/14/23 1210 07/15/23 0225 07/19/23 0920  07/19/23 0933 07/20/23 0731  WBC 8.8 7.6 11.8*  --  9.3  NEUTROABS  --   --  9.5*  --   --   HGB 9.5* 9.2* 9.4* 10.2*  9.9* 9.1*  HCT 31.5* 30.0* 32.0* 30.0*  29.0* 30.8*  MCV 84.2 81.3 84.9  --  83.2  PLT 359 373 375  --  377    Basic Metabolic Panel: Recent Labs  Lab 07/15/23 0225 07/16/23 0430 07/17/23 0235 07/19/23 0920 07/19/23 0933 07/20/23 0731  NA 137 137 135 140 140  140 136  K 3.3* 3.3* 3.4* 3.6 3.6  3.6 3.4*  CL 101 98 95* 102 100 95*  CO2 26 30 31 26   --  30  GLUCOSE 145* 157* 235* 142* 141* 161*  BUN 8 8 12 9 8 10   CREATININE 0.56* 0.53* 0.65 0.60* 0.50* 0.84  CALCIUM 8.5* 8.3* 8.4* 8.6*  --  8.6*  MG 1.5* 1.5* 1.8  --   --   --     GFR: Estimated Creatinine Clearance: 81.9 mL/min (by C-G formula based on SCr of 0.84 mg/dL).  Liver Function Tests: Recent Labs  Lab 07/14/23 1210 07/19/23 0920  AST 13* 19  ALT 12 16  ALKPHOS 64 67  BILITOT 0.4 0.4  PROT 5.6* 5.5*  ALBUMIN 2.1* 2.3*    CBG: Recent Labs  Lab 07/17/23 1739 07/19/23 1709 07/19/23 2129 07/20/23 0744 07/20/23 1153  GLUCAP 208* 134* 241* 160* 109*     Recent Results (from the past 240 hour(s))  SARS Coronavirus 2 by RT PCR (hospital order, performed in [facility] hospital lab) *cepheid single result test* Anterior Nasal Swab     Status: None   Collection Time: 07/14/23 12:00 PM   Specimen: Anterior Nasal Swab  Result Value Ref Range Status   SARS Coronavirus 2 by RT PCR NEGATIVE NEGATIVE Final    Comment: Performed at [facility], [address] [place], [place] [address]  MRSA Next Gen by PCR, Nasal     Status: Abnormal   Collection Time: 07/14/23  6:09 PM   Specimen: Nasal Mucosa; Nasal Swab  Result Value Ref Range Status   MRSA by PCR Next Gen DETECTED (Godwin Tedesco) NOT DETECTED Final    Comment: RESULT CALLED TO, READ BACK BY AND VERIFIED WITH: RN LEE NITURADA ON 07/14/23 @ 2008 BY DRT (NOTE) The GeneXpert MRSA Assay (FDA approved for NASAL specimens only), is one  component of Jaquelinne Glendening comprehensive MRSA colonization surveillance program. It is not intended to diagnose MRSA infection nor to guide or monitor treatment for MRSA infections. Test performance is not FDA approved in patients less than 48 years old. Performed at [facility], [address] [place], [place] [address]   Blood Culture (Routine X 2)     Status: None (Preliminary result)   Collection Time: 07/19/23  8:55  AM   Specimen: BLOOD  Result Value Ref Range Status   Specimen Description BLOOD SITE NOT SPECIFIED  Final   Special Requests   Final    BOTTLES DRAWN AEROBIC ONLY Blood Culture adequate volume   Culture   Final    NO GROWTH < 24 HOURS Performed at [facility], [address], [place], [place] [address]    Report Status PENDING  Incomplete  Blood Culture (Routine X 2)     Status: None (Preliminary result)   Collection Time: 07/19/23  9:00 AM   Specimen: BLOOD  Result Value Ref Range Status   Specimen Description BLOOD SITE NOT SPECIFIED  Final   Special Requests   Final    BOTTLES DRAWN AEROBIC ONLY Blood Culture adequate volume   Culture   Final    NO GROWTH < 24 HOURS Performed at [facility], [address] [place], [place] [address]    Report Status PENDING  Incomplete  Resp panel by RT-PCR (RSV, Flu Zelene Barga&B, Covid) Anterior Nasal Swab     Status: None   Collection Time: 07/19/23  9:45 AM   Specimen: Anterior Nasal Swab  Result Value Ref Range Status   SARS Coronavirus 2 by RT PCR NEGATIVE NEGATIVE Final   Influenza Kiira Brach by PCR NEGATIVE NEGATIVE Final   Influenza B by PCR NEGATIVE NEGATIVE Final    Comment: (NOTE) The Xpert Xpress SARS-CoV-2/FLU/RSV plus assay is intended as an aid in the diagnosis of influenza from Nasopharyngeal swab specimens and should not be used as Dabid Godown sole basis for treatment. Nasal washings and aspirates are unacceptable for Xpert Xpress SARS-CoV-2/FLU/RSV testing.  Fact Sheet for  Patients: BloggerCourse.com  Fact Sheet for Healthcare Providers: SeriousBroker.it  This test is not yet approved or cleared by the [place] FDA and has been authorized for detection and/or diagnosis of SARS-CoV-2 by FDA under an Emergency Use Authorization (EUA). This EUA will remain in effect (meaning this test can be used) for the duration of the COVID-19 declaration under Section 564(b)(1) of the Act, 21 U.S.C. section 360bbb-3(b)(1), unless the authorization is terminated or revoked.     Resp Syncytial Virus by PCR NEGATIVE NEGATIVE Final    Comment: (NOTE) Fact Sheet for Patients: BloggerCourse.com  Fact Sheet for Healthcare Providers: SeriousBroker.it  This test is not yet approved or cleared by the [place] FDA and has been authorized for detection and/or diagnosis of SARS-CoV-2 by FDA under an Emergency Use Authorization (EUA). This EUA will remain in effect (meaning this test can be used) for the duration of the COVID-19 declaration under Section 564(b)(1) of the Act, 21 U.S.C. section 360bbb-3(b)(1), unless the authorization is terminated or revoked.  Performed at [facility], [address] [place], [place] [address]          Radiology Studies: CT ABDOMEN PELVIS W CONTRAST  Result Date: 07/19/2023 CLINICAL DATA:  Abdominal pain, acute, nonlocalized. EXAM: CT ABDOMEN AND PELVIS WITH CONTRAST TECHNIQUE: Multidetector CT imaging of the abdomen and pelvis was performed using the standard protocol following bolus administration of intravenous contrast. RADIATION DOSE REDUCTION: This exam was performed according to the departmental dose-optimization program which includes automated exposure control, adjustment of the mA and/or kV according to patient size and/or use of iterative reconstruction technique. CONTRAST:  75mL OMNIPAQUE IOHEXOL 350 MG/ML SOLN  COMPARISON:  CT abdomen/pelvis dated 06/30/2023 FINDINGS: Lower chest: Please refer to the same-day CT chest for description of intrathoracic findings. Hepatobiliary: No focal liver abnormality  is seen. Status post cholecystectomy. No biliary dilatation. Pancreas: Unremarkable. No pancreatic ductal dilatation or surrounding inflammatory changes. Spleen: Normal in size without focal abnormality. Adrenals/Urinary Tract: Adrenal glands are unremarkable. Stable appearance of the kidneys including Harli Engelken densely calcified cyst at the superior pole of the left kidney, for which no follow-up imaging is recommended. No renal calculi or hydronephrosis. The bladder is decompressed with Petula Rotolo suprapubic catheter in place. Stomach/Bowel: Stable postoperative changes of the stomach related to prior gastrocolic fistula repair at the level of the gastric body. No evidence of recurrent fistula. The appendix is normal. Stable left lower quadrant colostomy. No evidence of obstruction. Colonic diverticulosis without evidence of acute diverticulitis. No abdominopelvic ascites. No intraperitoneal free air. Vascular/Lymphatic: Aortic atherosclerosis. No enlarged abdominal or pelvic lymph nodes. Reproductive: Prostate is unremarkable. Other: Similar appearance of Hubbard Seldon right posterior ischial decubitus ulcer which extends to the level of the right ischial tuberosity with surrounding soft tissue thickening. Chronic appearing sclerotic changes of the underlying right ischial tuberosity without definite areas of acute osteolysis identified. Similar cutaneous thickening overlying the sacrum and left ischial tuberosity. Postoperative changes along the ventral abdominal wall. Musculoskeletal: No acute osseous abnormality. Stable fusion of the L4-L5 vertebral bodies. IMPRESSION: 1. No acute localizing findings in the abdomen or pelvis. 2. Stable postoperative changes of the stomach related to prior gastrocolic fistula repair without evidence of recurrent  fistula. 3. Similar appearance of Raymie Trani right posterior ischial decubitus ulcer with chronic sclerotic changes of the underlying right ischial tuberosity. No definite acute osteolysis is identified. 4. Stable left lower quadrant colostomy. 5. Stable suprapubic catheter. 6.  Aortic Atherosclerosis (ICD10-I70.0). 7. Additional unchanged ancillary findings as described above. Electronically Signed   By: Hart Robinsons M.D.   On: 07/19/2023 13:22   CT Angio Chest PE W and/or Wo Contrast  Result Date: 07/19/2023 CLINICAL DATA:  Pulmonary embolism (PE) suspected, high prob. Weakness. Bradycardia. EXAM: CT ANGIOGRAPHY CHEST WITH CONTRAST TECHNIQUE: Multidetector CT imaging of the chest was performed using the standard protocol during bolus administration of intravenous contrast. Multiplanar CT image reconstructions and MIPs were obtained to evaluate the vascular anatomy. RADIATION DOSE REDUCTION: This exam was performed according to the departmental dose-optimization program which includes automated exposure control, adjustment of the mA and/or kV according to patient size and/or use of iterative reconstruction technique. CONTRAST:  75mL OMNIPAQUE IOHEXOL 350 MG/ML SOLN COMPARISON:  CTA chest dated November 01, 2021. FINDINGS: Cardiovascular: Satisfactory opacification of the pulmonary arteries to the segmental level. No evidence of pulmonary embolism. Heart size is borderline enlarged. Multivessel coronary artery calcifications. Atherosclerotic calcifications of the thoracic aorta and arch branch vessels. There is Anahis Furgeson small pericardial effusion. Right-sided PICC tip at the level of the superior cavoatrial junction. Mediastinum/Nodes: No enlarged mediastinal, hilar, or axillary lymph nodes. Thyroid gland, trachea, and esophagus demonstrate no significant findings. Lungs/Pleura: Moderate-sized left-greater-than-right pleural effusions with basilar atelectasis. Consolidative opacities in the posterior left lower lung base.  No pneumothorax. Upper Abdomen: Please refer to the same-day CT abdomen/pelvis for description of intra-abdominal findings. Musculoskeletal: No acute osseous abnormality. No suspicious osseous lesion. Review of the MIP images confirms the above findings. IMPRESSION: 1. No evidence of acute pulmonary embolism. 2. Moderate-sized left-greater-than-right pleural effusions with basilar atelectasis. Consolidative opacities in the posterior left lower lung base may represent atelectasis, however, an underlying infiltrate or mass can not be excluded. 3. Small pericardial effusion. 4.  Aortic Atherosclerosis (ICD10-I70.0). Electronically Signed   By: Hart Robinsons M.D.   On: 07/19/2023 12:57  CT HEAD WO CONTRAST  Result Date: 07/19/2023 CLINICAL DATA:  Provided history: Mental status change, unknown cause. Polytrauma, blunt. Additional history provided: Generalized weakness, bradycardia, neck pain, headache, nausea, recent sepsis. EXAM: CT HEAD WITHOUT CONTRAST CT CERVICAL SPINE WITHOUT CONTRAST TECHNIQUE: Multidetector CT imaging of the head and cervical spine was performed following the standard protocol without intravenous contrast. Multiplanar CT image reconstructions of the cervical spine were also generated. RADIATION DOSE REDUCTION: This exam was performed according to the departmental dose-optimization program which includes automated exposure control, adjustment of the mA and/or kV according to patient size and/or use of iterative reconstruction technique. COMPARISON:  Head CT 03/07/2023.  Cervical spine MRI 05/25/2020. FINDINGS: CT HEAD FINDINGS Brain: Generalized cerebral atrophy. Prominence perivascular spaces within the inferior basal ganglia, bilaterally. Apparent prominence of the retrocerebellar CSF space at midline and to the right, unchanged from the prior head CT of 03/07/2023. This may reflect Haydon Kalmar mega cisterna magna or an arachnoid cyst. There is no acute intracranial hemorrhage. No demarcated  cortical infarct. No evidence of an intracranial mass. No midline shift. Vascular: No hyperdense vessel.  Atherosclerotic calcifications. Skull: No calvarial fracture or aggressive osseous lesion. Sinuses/Orbits: No mass or acute finding within the imaged orbits. Trace mucosal thickening within the right maxillary and bilateral ethmoid sinuses at the imaged levels. Other: Trace fluid within the right mastoid air cells. CT CERVICAL SPINE FINDINGS Alignment: No significant spondylolisthesis. Skull base and vertebrae: The basion-dental and atlanto-dental intervals are maintained.No evidence of acute fracture to the cervical spine. Soft tissues and spinal canal: No prevertebral fluid or swelling. No visible canal hematoma. Disc levels: Cervical spondylosis with multilevel disc space narrowing, disc bulges/central disc protrusions, uncovertebral hypertrophy and facet arthrosis. Disc space narrowing is greatest at C6-C7 and C7-T1 (moderate to advanced at these levels). Posterior disc osteophyte complex at C5-C6. Mild ossification of the posterior longitudinal ligament at the C2 level. No appreciable high-grade spinal canal stenosis. Multilevel bony neural foraminal narrowing. Multilevel ventral osteophytes, most prominent at C5-C6 and C6-C7. Upper chest: Partially imaged bilateral pleural effusions. Bilaterally. IMPRESSION: CT head: 1. No evidence of an acute intracranial abnormality. 2. Cerebral atrophy. CT cervical spine: 1. No evidence of an acute fracture to the cervical spine. 2. Cervical spondylosis and ossification of the posterior longitudinal ligament as described. 3. Partially imaged bilateral pleural effusions. Electronically Signed   By: Jackey Loge D.O.   On: 07/19/2023 12:38   CT CERVICAL SPINE WO CONTRAST  Result Date: 07/19/2023 CLINICAL DATA:  Provided history: Mental status change, unknown cause. Polytrauma, blunt. Additional history provided: Generalized weakness, bradycardia, neck pain, headache,  nausea, recent sepsis. EXAM: CT HEAD WITHOUT CONTRAST CT CERVICAL SPINE WITHOUT CONTRAST TECHNIQUE: Multidetector CT imaging of the head and cervical spine was performed following the standard protocol without intravenous contrast. Multiplanar CT image reconstructions of the cervical spine were also generated. RADIATION DOSE REDUCTION: This exam was performed according to the departmental dose-optimization program which includes automated exposure control, adjustment of the mA and/or kV according to patient size and/or use of iterative reconstruction technique. COMPARISON:  Head CT 03/07/2023.  Cervical spine MRI 05/25/2020. FINDINGS: CT HEAD FINDINGS Brain: Generalized cerebral atrophy. Prominence perivascular spaces within the inferior basal ganglia, bilaterally. Apparent prominence of the retrocerebellar CSF space at midline and to the right, unchanged from the prior head CT of 03/07/2023. This may reflect Marshun Duva mega cisterna magna or an arachnoid cyst. There is no acute intracranial hemorrhage. No demarcated cortical infarct. No evidence of an intracranial mass. No midline  shift. Vascular: No hyperdense vessel.  Atherosclerotic calcifications. Skull: No calvarial fracture or aggressive osseous lesion. Sinuses/Orbits: No mass or acute finding within the imaged orbits. Trace mucosal thickening within the right maxillary and bilateral ethmoid sinuses at the imaged levels. Other: Trace fluid within the right mastoid air cells. CT CERVICAL SPINE FINDINGS Alignment: No significant spondylolisthesis. Skull base and vertebrae: The basion-dental and atlanto-dental intervals are maintained.No evidence of acute fracture to the cervical spine. Soft tissues and spinal canal: No prevertebral fluid or swelling. No visible canal hematoma. Disc levels: Cervical spondylosis with multilevel disc space narrowing, disc bulges/central disc protrusions, uncovertebral hypertrophy and facet arthrosis. Disc space narrowing is greatest at  C6-C7 and C7-T1 (moderate to advanced at these levels). Posterior disc osteophyte complex at C5-C6. Mild ossification of the posterior longitudinal ligament at the C2 level. No appreciable high-grade spinal canal stenosis. Multilevel bony neural foraminal narrowing. Multilevel ventral osteophytes, most prominent at C5-C6 and C6-C7. Upper chest: Partially imaged bilateral pleural effusions. Bilaterally. IMPRESSION: CT head: 1. No evidence of an acute intracranial abnormality. 2. Cerebral atrophy. CT cervical spine: 1. No evidence of an acute fracture to the cervical spine. 2. Cervical spondylosis and ossification of the posterior longitudinal ligament as described. 3. Partially imaged bilateral pleural effusions. Electronically Signed   By: Jackey Loge D.O.   On: 07/19/2023 12:38   DG Chest Port 1 View  Result Date: 07/19/2023 CLINICAL DATA:  Altered mental status EXAM: PORTABLE CHEST 1 VIEW COMPARISON:  07/15/2023 x-ray FINDINGS: Right-sided PICC in place with tip at the SVC right atrial junction. Overlapping cardiac leads. Stable cardiopericardial silhouette with some vascular congestion and interstitial changes. Small effusions. No pneumothorax. Degenerative changes. Calcified aorta. IMPRESSION: No significant interval change compared to previous. Stable appearance of the right-sided PICC Electronically Signed   By: Karen Kays M.D.   On: 07/19/2023 10:06        Scheduled Meds:  ascorbic acid  500 mg Oral Daily   atorvastatin  40 mg Oral QHS   baclofen  20 mg Oral TID   Chlorhexidine Gluconate Cloth  6 each Topical Daily   DULoxetine  30 mg Oral QHS   enoxaparin (LOVENOX) injection  40 mg Subcutaneous Q24H   furosemide  40 mg Intravenous BID   Gerhardt's butt cream   Topical TID   guaiFENesin  600 mg Oral BID   insulin aspart  0-15 Units Subcutaneous TID WC   insulin glargine-yfgn  24 Units Subcutaneous BID   lamoTRIgine  50 mg Oral BID   leptospermum manuka honey  1 Application Topical  Daily   losartan  75 mg Oral Daily   nystatin  1 Application Topical Daily   pantoprazole  40 mg Oral Daily   potassium chloride SA  20 mEq Oral Daily   potassium chloride  40 mEq Oral Q4H   senna-docusate  1 tablet Oral BID   spironolactone  25 mg Oral Daily   traZODone  100 mg Oral QHS   zinc sulfate  220 mg Oral Daily   Continuous Infusions:  piperacillin-tazobactam 3.375 g (07/20/23 1208)     LOS: 1 day    Time spent: oer 30 min    Lacretia Nicks, MD [facility]   To contact the attending provider between 7A-7P or the covering provider during after hours 7P-7A, please log into the web site [email or web address] and access using universal [place] password for that web site. If you do not have the password, please call the hospital operator.  07/20/2023,  3:38 PM

## 2023-07-20 NOTE — Plan of Care (Signed)

## 2023-07-21 ENCOUNTER — Other Ambulatory Visit: Payer: Self-pay

## 2023-07-21 DIAGNOSIS — J90 Pleural effusion, not elsewhere classified: Secondary | ICD-10-CM | POA: Diagnosis not present

## 2023-07-21 LAB — FERRITIN: Ferritin: 95 ng/mL (ref 24–336)

## 2023-07-21 LAB — CBC
HCT: 33.6 % — ABNORMAL LOW (ref 39.0–52.0)
Hemoglobin: 10.2 g/dL — ABNORMAL LOW (ref 13.0–17.0)
MCH: 25.1 pg — ABNORMAL LOW (ref 26.0–34.0)
MCHC: 30.4 g/dL (ref 30.0–36.0)
MCV: 82.6 fL (ref 80.0–100.0)
Platelets: 396 10*3/uL (ref 150–400)
RBC: 4.07 MIL/uL — ABNORMAL LOW (ref 4.22–5.81)
RDW: 17 % — ABNORMAL HIGH (ref 11.5–15.5)
WBC: 9.6 10*3/uL (ref 4.0–10.5)
nRBC: 0 % (ref 0.0–0.2)

## 2023-07-21 LAB — BASIC METABOLIC PANEL
Anion gap: 12 (ref 5–15)
BUN: 8 mg/dL (ref 8–23)
CO2: 24 mmol/L (ref 22–32)
Calcium: 8.5 mg/dL — ABNORMAL LOW (ref 8.9–10.3)
Chloride: 103 mmol/L (ref 98–111)
Creatinine, Ser: 0.72 mg/dL (ref 0.61–1.24)
GFR, Estimated: >60 mL/min (ref >60)
Glucose, Bld: 159 mg/dL — ABNORMAL HIGH (ref 70–99)
Potassium: 4 mmol/L (ref 3.5–5.1)
Sodium: 139 mmol/L (ref 135–145)

## 2023-07-21 LAB — IRON AND TIBC
Iron: 20 ug/dL — ABNORMAL LOW (ref 45–182)
Saturation Ratios: 7 % — ABNORMAL LOW (ref 17.9–39.5)
TIBC: 291 ug/dL (ref 250–450)
UIBC: 271 ug/dL

## 2023-07-21 LAB — GLUCOSE, CAPILLARY
Glucose-Capillary: 164 mg/dL — ABNORMAL HIGH (ref 70–99)
Glucose-Capillary: 130 mg/dL — ABNORMAL HIGH (ref 70–99)
Glucose-Capillary: 147 mg/dL — ABNORMAL HIGH (ref 70–99)
Glucose-Capillary: 128 mg/dL — ABNORMAL HIGH (ref 70–99)

## 2023-07-21 LAB — VITAMIN B12: Vitamin B-12: 829 pg/mL (ref 180–914)

## 2023-07-21 LAB — FOLATE: Folate: 29.1 ng/mL (ref >5.9)

## 2023-07-21 LAB — BRAIN NATRIURETIC PEPTIDE: B Natriuretic Peptide: 73.6 pg/mL (ref 0.0–100.0)

## 2023-07-21 MED ORDER — PIPERACILLIN-TAZOBACTAM 3.375 G IVPB
3.3750 g | Freq: Three times a day (TID) | INTRAVENOUS | Status: DC
  Administered 2023-07-22 – 2023-07-26 (×15): 3.375 g via INTRAVENOUS
  Filled 2023-07-21 (×19): qty 50

## 2023-07-21 MED ORDER — FERROUS SULFATE 325 (65 FE) MG PO TABS
325.0000 mg | ORAL_TABLET | ORAL | Status: DC
  Administered 2023-07-23 – 2023-07-27 (×3): 325 mg via ORAL
  Filled 2023-07-21 (×6): qty 1

## 2023-07-21 NOTE — Progress Notes (Signed)
PHARMACY NOTE:  ANTIMICROBIAL RENAL DOSAGE ADJUSTMENT  Current antimicrobial regimen includes a mismatch between antimicrobial dosage and estimated renal function.  As per policy approved by the Pharmacy & Therapeutics and Medical Executive Committees, the antimicrobial dosage will be adjusted accordingly.  Current antimicrobial dosage:  piperacillin/tazobactam 3.375g q6hr over 30 min  Indication: osteomyelitis  Renal Function:  Estimated Creatinine Clearance: 86 mL/min (by C-G formula based on SCr of 0.72 mg/dL).     Antimicrobial dosage has been changed to:  3.375g q8hr Extended infusion  Additional comments: Change back to q6hr over 30 min at discharge for home infusion   Thank you for allowing pharmacy to be a part of this patient's care.  Leander Rams, [facility] 07/21/2023 9:27 PM

## 2023-07-21 NOTE — TOC Initial Note (Addendum)
Transition of Care (TOC) - Initial/Assessment Note    Patient Details  Name: Brent [name]. MRN: [id number] Date of Birth: Jan 04, 1946  Transition of [facility]) CM/SW Contact:    Delilah Shan, LCSWA Phone Number: 07/21/2023, 3:29 PM  Clinical Narrative:                   Patient is from [facility]. CSW LVM for patients spouse [name]. CSW awaiting call back. Patient is from [facility] long term. CSW spoke with [place] with [facility] who confirmed patient can dc back over tomorrow when medically ready.CSW will continue to follow and assist with patients dc planning needs.   Please contact Marcelino Freestone who is covering for [facility] at [facility] at [phone].     Patient Goals and CMS Choice            Expected Discharge Plan and Services                                              Prior Living Arrangements/Services                       Activities of Daily Living   ADL Screening (condition at time of admission) Independently performs ADLs?: No Does the patient have a NEW difficulty with bathing/dressing/toileting/self-feeding that is expected to last >3 days?: No Does the patient have a NEW difficulty with getting in/out of bed, walking, or climbing stairs that is expected to last >3 days?: No Does the patient have a NEW difficulty with communication that is expected to last >3 days?: No Is the patient deaf or have difficulty hearing?: No Does the patient have difficulty seeing, even when wearing glasses/contacts?: No Does the patient have difficulty concentrating, remembering, or making decisions?: No  Permission Sought/Granted                  Emotional Assessment              Admission diagnosis:  Confusion [R41.0] Pleural effusion [J90] (HFpEF) heart failure with preserved ejection fraction (HCC) [I50.30] Patient Active Problem List   Diagnosis Date Noted   Pleural effusion 07/19/2023   Leukocytosis 07/19/2023   Sinus  bradycardia 07/19/2023   (HFpEF) heart failure with preserved ejection fraction (HCC) 07/14/2023   History of osteomyelitis 07/14/2023   GERD (gastroesophageal reflux disease) 07/14/2023   Diabetic foot ulcer (HCC) 07/01/2023   Acute osteomyelitis of metatarsal bone of right foot (HCC) 07/01/2023   Septic arthritis of foot (HCC) 07/01/2023   Colostomy present (HCC) 07/01/2023   DM type 2 (diabetes mellitus, type 2) (HCC) 07/01/2023   Aortic atherosclerosis (HCC) 07/01/2023   Pressure ulcer 07/01/2023   Stage IV decubitus ulcer (HCC) 06/30/2023   Urine leukocytes 06/30/2023   SIRS (systemic inflammatory response syndrome) (HCC) 06/05/2023   Suprapubic catheter (HCC) 06/05/2023   Dehydration 02/21/2023   Sepsis (HCC) 02/21/2023   Acute metabolic encephalopathy 02/21/2023   Lactic acidosis 02/21/2023   Insulin dependent type 2 diabetes mellitus (HCC) 02/21/2023   Normocytic anemia 02/21/2023   Primary malignant neuroendocrine tumor of stomach (HCC) 05/27/2022   Preoperative examination 05/27/2022   Abnormal endoscopy of upper gastrointestinal tract 05/27/2022   Benign neuroendocrine tumor of stomach  04/04/2022   Mucosal abnormality of stomach    Chronic osteomyelitis of pelvic region, right (HCC) 03/25/2022  External gastrointestinal fistula site infection 03/25/2022   Complicated urinary tract infection    Catheter-associated urinary tract infection (HCC) 08/06/2021   Osteomyelitis of pelvic region, acute, right (HCC) 03/24/2021   Right ischial pressure sore 02/10/2021   Stage IV pressure ulcer of sacral region (HCC) 12/22/2020   Hypokalemia    Encounter for monitoring immunomodulating therapy 08/20/2020   High risk medication use 08/20/2020   Malnutrition of moderate degree 06/04/2020   Palliative care by specialist    Goals of care, counseling/discussion    FTT (failure to thrive) in adult 06/01/2020   Abdominal pain    Sacral ulcer, with necrosis of muscle (HCC) 04/09/2020    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HCC)    Transverse myelitis (HCC)    Labile blood pressure    Labile blood glucose    Neurogenic bladder    Neurogenic bowel    Transaminitis    Controlled type 2 diabetes mellitus with hyperglycemia, with long-term current use of insulin (HCC)    Quadriplegia (HCC)    Dyslipidemia    Paraplegia (HCC)    Coronary artery disease involving native coronary artery of native heart without angina pectoris    Steroid-induced hyperglycemia    Diabetic peripheral neuropathy (HCC)    Weakness 03/09/2020   Chest pain 03/08/2020   Right leg numbness    Malignant melanoma of left side of neck (HCC) 10/25/2018   Facial neuritis 10/25/2018   Myofascial pain syndrome 10/25/2018   Occipital neuralgia of left side 10/25/2018   Male hypogonadism 06/20/2016   Renal lesion 06/20/2016   Insomnia 08/07/2015   Enlarged prostate with lower urinary tract symptoms (LUTS) 11/07/2014   Balanitis 07/30/2012   Bladder neck obstruction 07/30/2012   Prostate nodule 06/18/2012   Recurrent nephrolithiasis 06/18/2012   Benign neoplasm of colon 08/29/2011   DEPRESSION, SITUATIONAL, ACUTE 01/23/2010   Essential hypertension 05/30/2009   Coronary atherosclerosis 05/30/2009   Obstructive sleep apnea 11/28/2008   OBESITY 09/23/2008   ERECTILE DYSFUNCTION 11/26/2007   Well controlled type 2 diabetes mellitus with peripheral circulatory disorder (HCC) 05/09/2007   Hyperlipidemia 05/09/2007   MYOCARDIAL INFARCTION, HX OF 05/09/2007   DIVERTICULOSIS, COLON 05/09/2007   PCP:  [name], MD Pharmacy:  No Pharmacies Listed    Social Determinants of Health (SDOH) Social History: SDOH Screenings   Food Insecurity: No Food Insecurity (07/19/2023)  Housing: Low Risk  (07/19/2023)  Transportation Needs: No Transportation Needs (07/19/2023)  Utilities: Not At Risk (07/19/2023)  Depression (PHQ2-9): Low Risk  (12/09/2020)  Financial Resource Strain: Low Risk  (03/06/2020)  Physical  Activity: Inactive (12/14/2018)  Social Connections: Unknown (12/14/2018)  Stress: No Stress Concern Present (12/14/2018)  Tobacco Use: Medium Risk (07/19/2023)   SDOH Interventions:     Readmission Risk Interventions    07/02/2023   12:31 PM  Readmission Risk Prevention Plan  Transportation Screening Complete  Medication Review (RN Care Manager) Complete  PCP or Specialist appointment within 3-5 days of discharge Not Complete  PCP/Specialist Appt Not Complete comments SNF resident  [facility] or Home Care Consult Complete  SW Recovery Care/Counseling Consult Complete  Palliative Care Screening Not Applicable  Skilled Nursing Facility Complete

## 2023-07-21 NOTE — Plan of Care (Signed)

## 2023-07-21 NOTE — Progress Notes (Addendum)
PROGRESS NOTE    Brent [name].  EXB:[id number] DOB: 03-06-1946 DOA: 07/19/2023 PCP: [name], MD  Chief Complaint  Patient presents with   Weakness    Brief Narrative:   Brent [name]. is Brent [name] 77 y.o. male with medical history significant of quadriparesis and quadriplegia due to transverse myelitis, GI fistula s/p post wedge resection 9/24, neurogenic bladder s/p suprapubic catheter, stage IV decubitus ulcer, insulin-dependent DM type II, asthma who presents with complaints of malaise.  History is obtained with the assistance of the patient's wife who is present at bedside.  Apparently the patient called out after feeling poorly and was noted to have heart rates as low as 46 stating that he felt like he was going to die for which the facility called EMS.  He just recently been hospitalized 10/11-10/17 for evaluation for sacral ulcer and right foot wound which revealed right foot osteomyelitis.  Patient was seen by orthopedics and underwent Brent [name] right fifth ray amputation.  ID have been consulted and recommended 6 weeks of IV Zosyn in the noted to be 08/14/23 for which PICC line was placed.  After patient was rehospitalized from 10/25-10/28 with shortness of breath secondary to acute on chronic heart failure with preserved EF.  Patient was treated with IV Lasix and discharged back to the SNF on 10/28.  Since being at the facility patient reported that he still had Brent [name] cough.  Patient wife notes that he has had intermittent confusion, but the Ativan does help.     Upon admission into the emergency department patient was noted to be hypothermic with pulse 45-69, blood pressures 74/51-162/51, and O2 saturation currently maintained on 2 L nasal cannula oxygen.  WBC elevated at 11.8, hemoglobin 9.4, glucose 142, BNP 136.4, high-sensitivity troponin negative x 2.  Chest x-ray noted stable right sided PICC.  CT angiogram of the head and cervical spine did not note any acute abnormality.  CT  angiogram of the chest abdomen pelvis noted moderate-sized left greater than right pleural effusion with basilar atelectasis, and consolidative opacities in the posterior left lung base.  Patient was given Lasix 40 mg IV x 1 dose and alteplase 2 mg IV.  Assessment & Plan:   Principal Problem:   Pleural effusion Active Problems:   (HFpEF) heart failure with preserved ejection fraction (HCC)   History of osteomyelitis   Leukocytosis   Sinus bradycardia   Hyperlipidemia   DEPRESSION, SITUATIONAL, ACUTE   Essential hypertension   Paraplegia (HCC)   Controlled type 2 diabetes mellitus with hyperglycemia, with long-term current use of insulin (HCC)   Transverse myelitis (HCC)   Stage IV pressure ulcer of sacral region [facility])   External gastrointestinal fistula site infection   Normocytic anemia   GERD (gastroesophageal reflux disease)  Pleural effusion Heart failure with preserved ejection fraction Acute on chronic.  CT scan of the chest noted moderate-sized left greater than right pleural effusion with basilar atelectasis, and consolidative opacities in the posterior left lung base.  BNP was elevated at 136.4.  Procal <0.1 Last echocardiogram 07/15/23 noted EF to be 60 to 65% with normal diastolic parameters.   Appreciate pulm recs - admission for IV diuresis, no role for thora, low salt diet, optimizing oral meds prior to discharge Consider cards c/s (will hold off on repeat echo given one done recently) Continue lasix BID Repeat CXR 11/2 Strict I/O, daily weights  Bradycardia Transient hypotension Heart rates noted to be in the 40-50s upon admission with initial  blood pressure reported to be as low as 74/51.   Hold antihypertensives and AV nodal blockers (atenolol, losratan, spironolactone on hold)  Chest Discomfort Shortness of Breath Lightheadedness Due to above? EKG without concerning findings.  Trop negative.  CXR 10/31 with improved aeration of both lungs, mild pulm  vascular congestion, L pleural effusion .  Treating volume overload as above Additional w/u if symptoms persistent/not improving  Leukocytosis Improved, follow    History of osteomyelitis of the right fifth metatarsal Septic arthritis of fifth MTP joint Patient s/p fifth ray amputation on 10/13 by orthopedics after MRI showed fifth MTP joint septic arthritis and osteomyelitis of the fifth metatarsal and fifth proximal phalanx. OR cultures growing Providencia/Proteus/E faecalis/Bacteroides.  Scheduled to be on 6 weeks of IV Zosyn per ID until 08/14/2023.  Pulled picc overnight, will need to be replaced. -Continue Zosyn   Diabetes mellitus type 2, with long-term use of insulin Last hemoglobin A1c was 7.5 on 06/06/2023.  Home medication regimen includes Semglee -Hold metformin -Pharmacy substitution of Semglee at 24 units twice daily -SSI    Essential hypertension -hold losartan and spironolactone (soft BP this AM noted) -follow with diuresis (blood pressures on low side today, will trend)   Anemia of chronic disease Trend Labs suggestive of iron def  Stage IV decubitus ulcer Ischial decubitus ulcer Patient has chronic ulcers for which previous CT imaging imaging from 10/11 showed stable ulcer with no fluid collection . -Low-air-loss mattress replacement -appreciate wound care recs Clean plantar foot intact sutures with NS, apply Xeroform to incision site daily, cover with dry gauze or silicone foam.  Clean Sacral and Ischial wounds with Vashe wound cleanser [name] [phone]), apply Medihoney to wound beds daily, cover with dry gauze.  Apply Gerhardt's Butt Cream to surrounding skin of sacrum/coccyx/buttocks 3 times daily and prn soiling. May cover with ABD pad or silicone foams whichever is preferred.    Paraplegia secondary to transverse myelitis Neurogenic bladder s/p suprapubic catheter -Assistance with feeding -Continue baclofen   History of gastrocolic fistula S/p  colostomy -Continue colostomy care   Hyperlipidemia -Continue atorvastatin   Depression -Continue Cymbalta   GERD -Continue pharmacy substitution of Protonix      DVT prophylaxis: lovenox Code Status: full Family Communication: wife Disposition:   Status is: Inpatient Remains inpatient appropriate because: continued inpatient care   Consultants:  Pulmonary   Procedures:  none  Antimicrobials:  Anti-infectives (From admission, onward)    Start     Dose/Rate Route Frequency Ordered Stop   07/19/23 1800  piperacillin-tazobactam (ZOSYN) IVPB 3.375 g        3.375 g 100 mL/hr over 30 Minutes Intravenous Every 6 hours 07/19/23 1659 08/15/23 2359   07/19/23 1745  piperacillin-tazobactam (ZOSYN) IVPB  Status:  Discontinued       Note to Pharmacy: Indication: diabetic foot infection/osteomyelitis  First Dose: Yes Last Day of Therapy: 08/15/2023 Labs - Once weekly:  CBC/D and BMP, Labs - Once weekly: ESR and CRP     3.375 g Intravenous Every 6 hours 07/19/23 1652 07/19/23 1657       Subjective: No new complaints He wants to get out of the hospital soon   Objective: Vitals:   07/20/23 0355 07/20/23 0500 07/20/23 1939 07/20/23 2241  BP: (!) 92/46   (!) 109/49  Pulse: (!) 55  64 69  Resp:    20  Temp: 98.4 F (36.9 C)  98 F (36.7 C) 98 F (36.7 C)  TempSrc: Axillary  Oral Oral  SpO2: 92%  95% 96%  Weight:  94.2 kg    Height:        Intake/Output Summary (Last 24 hours) at 07/21/2023 1526 Last data filed at 07/21/2023 0520 Gross per 24 hour  Intake 150 ml  Output 1500 ml  Net -1350 ml   Filed Weights   07/20/23 0500  Weight: 94.2 kg    Examination:  General: No acute distress. Cardiovascular:RRR Lungs: unlabored Abdomen: Soft, nontender, nondistended  Neurological: Alert and oriented 3. Moves all extremities 4 with equal strength. Cranial nerves II through XII grossly intact. Extremities: No clubbing or cyanosis. No edema.   Data Reviewed: I  have personally reviewed following labs and imaging studies  CBC: Recent Labs  Lab 07/15/23 0225 07/19/23 0920 07/19/23 0933 07/20/23 0731 07/21/23 0624  WBC 7.6 11.8*  --  9.3 9.6  NEUTROABS  --  9.5*  --   --   --   HGB 9.2* 9.4* 10.2*  9.9* 9.1* 10.2*  HCT 30.0* 32.0* 30.0*  29.0* 30.8* 33.6*  MCV 81.3 84.9  --  83.2 82.6  PLT 373 375  --  377 396    Basic Metabolic Panel: Recent Labs  Lab 07/15/23 0225 07/16/23 0430 07/17/23 0235 07/19/23 0920 07/19/23 0933 07/20/23 0731 07/21/23 0624  NA 137 137 135 140 140  140 136 139  K 3.3* 3.3* 3.4* 3.6 3.6  3.6 3.4* 4.0  CL 101 98 95* 102 100 95* 103  CO2 26 30 31 26   --  30 24  GLUCOSE 145* 157* 235* 142* 141* 161* 159*  BUN 8 8 12 9 8 10 8   CREATININE 0.56* 0.53* 0.65 0.60* 0.50* 0.84 0.72  CALCIUM 8.5* 8.3* 8.4* 8.6*  --  8.6* 8.5*  MG 1.5* 1.5* 1.8  --   --   --   --     GFR: Estimated Creatinine Clearance: 86 mL/min (by C-G formula based on SCr of 0.72 mg/dL).  Liver Function Tests: Recent Labs  Lab 07/19/23 0920  AST 19  ALT 16  ALKPHOS 67  BILITOT 0.4  PROT 5.5*  ALBUMIN 2.3*    CBG: Recent Labs  Lab 07/20/23 1153 07/20/23 1619 07/20/23 2120 07/21/23 0720 07/21/23 1214  GLUCAP 109* 149* 193* 164* 130*     Recent Results (from the past 240 hour(s))  SARS Coronavirus 2 by RT PCR (hospital order, performed in [facility] hospital lab) *cepheid single result test* Anterior Nasal Swab     Status: None   Collection Time: 07/14/23 12:00 PM   Specimen: Anterior Nasal Swab  Result Value Ref Range Status   SARS Coronavirus 2 by RT PCR NEGATIVE NEGATIVE Final    Comment: Performed at [facility], [address], [place], [place] [address]  MRSA Next Gen by PCR, Nasal     Status: Abnormal   Collection Time: 07/14/23  6:09 PM   Specimen: Nasal Mucosa; Nasal Swab  Result Value Ref Range Status   MRSA by PCR Next Gen DETECTED ([place]) NOT DETECTED Final    Comment: RESULT CALLED TO, READ BACK BY  AND VERIFIED WITH: RN LEE NITURADA ON 07/14/23 @ 2008 BY DRT (NOTE) The GeneXpert MRSA Assay (FDA approved for NASAL specimens only), is one component of Sofya Moustafa comprehensive MRSA colonization surveillance program. It is not intended to diagnose MRSA infection nor to guide or monitor treatment for MRSA infections. Test performance is not FDA approved in patients less than 13 years old. Performed at [facility],  [address], [place], [place] [address]   Blood Culture (Routine X 2)     Status: None (Preliminary result)   Collection Time: 07/19/23  8:55 AM   Specimen: BLOOD  Result Value Ref Range Status   Specimen Description BLOOD SITE NOT SPECIFIED  Final   Special Requests   Final    BOTTLES DRAWN AEROBIC ONLY Blood Culture adequate volume   Culture   Final    NO GROWTH 2 DAYS Performed at [facility], [address], [place], [place] [address]    Report Status PENDING  Incomplete  Blood Culture (Routine X 2)     Status: None (Preliminary result)   Collection Time: 07/19/23  9:00 AM   Specimen: BLOOD  Result Value Ref Range Status   Specimen Description BLOOD SITE NOT SPECIFIED  Final   Special Requests   Final    BOTTLES DRAWN AEROBIC ONLY Blood Culture adequate volume   Culture   Final    NO GROWTH 2 DAYS Performed at [facility], [address], [place], [place] [address]    Report Status PENDING  Incomplete  Resp panel by RT-PCR (RSV, Flu Kindall Swaby&B, Covid) Anterior Nasal Swab     Status: None   Collection Time: 07/19/23  9:45 AM   Specimen: Anterior Nasal Swab  Result Value Ref Range Status   SARS Coronavirus 2 by RT PCR NEGATIVE NEGATIVE Final   Influenza Toney Difatta by PCR NEGATIVE NEGATIVE Final   Influenza B by PCR NEGATIVE NEGATIVE Final    Comment: (NOTE) The Xpert Xpress SARS-CoV-2/FLU/RSV plus assay is intended as an aid in the diagnosis of influenza from Nasopharyngeal swab specimens and should not be used as Marquis Diles sole basis for treatment. Nasal washings  and aspirates are unacceptable for Xpert Xpress SARS-CoV-2/FLU/RSV testing.  Fact Sheet for Patients: BloggerCourse.com  Fact Sheet for Healthcare Providers: SeriousBroker.it  This test is not yet approved or cleared by the [place] FDA and has been authorized for detection and/or diagnosis of SARS-CoV-2 by FDA under an Emergency Use Authorization (EUA). This EUA will remain in effect (meaning this test can be used) for the duration of the COVID-19 declaration under Section 564(b)(1) of the Act, 21 U.S.C. section 360bbb-3(b)(1), unless the authorization is terminated or revoked.     Resp Syncytial Virus by PCR NEGATIVE NEGATIVE Final    Comment: (NOTE) Fact Sheet for Patients: BloggerCourse.com  Fact Sheet for Healthcare Providers: SeriousBroker.it  This test is not yet approved or cleared by the [place] FDA and has been authorized for detection and/or diagnosis of SARS-CoV-2 by FDA under an Emergency Use Authorization (EUA). This EUA will remain in effect (meaning this test can be used) for the duration of the COVID-19 declaration under Section 564(b)(1) of the Act, 21 U.S.C. section 360bbb-3(b)(1), unless the authorization is terminated or revoked.  Performed at [facility], [address], [place], [place] [address]          Radiology Studies: DG CHEST PORT 1 VIEW  Result Date: 07/20/2023 CLINICAL DATA:  Shortness of breath. EXAM: PORTABLE CHEST 1 VIEW COMPARISON:  Chest x-ray 10/30/4 FINDINGS: The heart is enlarged. Left pleural effusion basilar airspace disease is again noted. Aeration of both lungs is improved with some residual mild pulmonary vascular congestion. Right-sided PICC line is stable. IMPRESSION: 1. Improved aeration of both lungs with some residual mild pulmonary vascular congestion. 2. Persistent left pleural effusion and basilar  airspace disease. Electronically Signed   By:  Marin Roberts M.D.   On: 07/20/2023 17:02        Scheduled Meds:  ascorbic acid  500 mg Oral Daily   atorvastatin  40 mg Oral QHS   baclofen  20 mg Oral TID   Chlorhexidine Gluconate Cloth  6 each Topical Daily   DULoxetine  30 mg Oral QHS   enoxaparin (LOVENOX) injection  40 mg Subcutaneous Q24H   furosemide  40 mg Intravenous BID   Gerhardt's butt cream   Topical TID   guaiFENesin  600 mg Oral BID   insulin aspart  0-15 Units Subcutaneous TID WC   insulin glargine-yfgn  24 Units Subcutaneous BID   lamoTRIgine  50 mg Oral BID   leptospermum manuka honey  1 Application Topical Daily   nystatin  1 Application Topical Daily   pantoprazole  40 mg Oral Daily   potassium chloride SA  20 mEq Oral Daily   senna-docusate  1 tablet Oral BID   traZODone  100 mg Oral QHS   zinc sulfate  220 mg Oral Daily   Continuous Infusions:  piperacillin-tazobactam 3.375 g (07/21/23 1223)     LOS: 2 days    Time spent: oer 30 min    Lacretia Nicks, MD [facility]   To contact the attending provider between 7A-7P or the covering provider during after hours 7P-7A, please log into the web site [email or web address] and access using universal [place] password for that web site. If you do not have the password, please call the hospital operator.  07/21/2023, 3:26 PM

## 2023-07-21 NOTE — Progress Notes (Signed)
4- Paged Dr. Joneen Roach regarding patient pulling at tubes and lines. Patient had mitts on and was able to get mitts off and pull iv out with his teeth. Patient has no recollection of doing this and is refusing he pulled anything out. Bilateral wrist restraints ordered.

## 2023-07-21 NOTE — Progress Notes (Signed)
Pt removed PICC from RT upper arm along with gown and was witnessed pulling on suprapubic ca tether. Applied mitts, established IV access per IV team, replaced gown, covered suprapubic cath with dressing. pt resting in bed with eyes closed. Bed locked in lowest position. Call bell in reach.

## 2023-07-22 ENCOUNTER — Inpatient Hospital Stay (HOSPITAL_COMMUNITY): Payer: Medicare Other

## 2023-07-22 DIAGNOSIS — J90 Pleural effusion, not elsewhere classified: Secondary | ICD-10-CM | POA: Diagnosis not present

## 2023-07-22 LAB — COMPREHENSIVE METABOLIC PANEL
ALT: 20 U/L (ref 0–44)
AST: 22 U/L (ref 15–41)
Albumin: 2.6 g/dL — ABNORMAL LOW (ref 3.5–5.0)
Alkaline Phosphatase: 83 U/L (ref 38–126)
Anion gap: 12 (ref 5–15)
BUN: 12 mg/dL (ref 8–23)
CO2: 24 mmol/L (ref 22–32)
Calcium: 8.9 mg/dL (ref 8.9–10.3)
Chloride: 100 mmol/L (ref 98–111)
Creatinine, Ser: 0.78 mg/dL (ref 0.61–1.24)
GFR, Estimated: >60 mL/min (ref >60)
Glucose, Bld: 176 mg/dL — ABNORMAL HIGH (ref 70–99)
Potassium: 3.8 mmol/L (ref 3.5–5.1)
Sodium: 136 mmol/L (ref 135–145)
Total Bilirubin: 0.5 mg/dL (ref 0.3–1.2)
Total Protein: 6.3 g/dL — ABNORMAL LOW (ref 6.5–8.1)

## 2023-07-22 LAB — AMMONIA: Ammonia: 25 umol/L (ref 9–35)

## 2023-07-22 LAB — GLUCOSE, CAPILLARY
Glucose-Capillary: 155 mg/dL — ABNORMAL HIGH (ref 70–99)
Glucose-Capillary: 127 mg/dL — ABNORMAL HIGH (ref 70–99)
Glucose-Capillary: 201 mg/dL — ABNORMAL HIGH (ref 70–99)
Glucose-Capillary: 99 mg/dL (ref 70–99)

## 2023-07-22 LAB — BLOOD GAS, VENOUS
Acid-Base Excess: 6.4 mmol/L — ABNORMAL HIGH (ref 0.0–2.0)
Bicarbonate: 31.3 mmol/L — ABNORMAL HIGH (ref 20.0–28.0)
O2 Saturation: 78.7 %
Patient temperature: 36.8
pCO2, Ven: 45 mm[Hg] (ref 44–60)
pH, Ven: 7.45 — ABNORMAL HIGH (ref 7.25–7.43)
pO2, Ven: 47 mm[Hg] — ABNORMAL HIGH (ref 32–45)

## 2023-07-22 MED ORDER — BENZONATATE 100 MG PO CAPS: 100.0000 mg | ORAL_CAPSULE | Freq: Three times a day (TID) | ORAL | Status: DC | PRN

## 2023-07-22 MED ORDER — ATENOLOL 25 MG PO TABS
12.5000 mg | ORAL_TABLET | Freq: Every morning | ORAL | Status: DC
  Administered 2023-07-22 – 2023-07-27 (×5): 12.5 mg via ORAL
  Filled 2023-07-22 (×6): qty 1

## 2023-07-22 MED ORDER — ONDANSETRON HCL 4 MG/2ML IJ SOLN
4.0000 mg | Freq: Four times a day (QID) | INTRAMUSCULAR | Status: DC | PRN
  Administered 2023-07-24 – 2023-07-25 (×2): 4 mg via INTRAVENOUS
  Filled 2023-07-22 (×2): qty 2

## 2023-07-22 MED ORDER — QUETIAPINE FUMARATE 25 MG PO TABS
25.0000 mg | ORAL_TABLET | Freq: Every day | ORAL | Status: DC
  Administered 2023-07-22 – 2023-07-24 (×3): 25 mg via ORAL
  Filled 2023-07-22 (×3): qty 1

## 2023-07-22 NOTE — Progress Notes (Signed)
PROGRESS NOTE    Ravi Tuccillo.  GNF:[id number] DOB: [birth year] DOA: 07/19/2023 PCP: [name], MD  Chief Complaint  Patient presents with   Weakness    Brief Narrative:   Brent [name]. is Brent [name] 77 y.o. male with medical history significant of quadriparesis and quadriplegia due to transverse myelitis, GI fistula s/p post wedge resection 9/24, neurogenic bladder s/p suprapubic catheter, stage IV decubitus ulcer, insulin-dependent DM type II, asthma who presents with complaints of malaise.  History is obtained with the assistance of the patient's wife who is present at bedside.  Apparently the patient called out after feeling poorly and was noted to have heart rates as low as 46 stating that he felt like he was going to die for which the facility called EMS.  He just recently been hospitalized 10/11-10/17 for evaluation for sacral ulcer and right foot wound which revealed right foot osteomyelitis.  Patient was seen by orthopedics and underwent Carlin Mamone right fifth ray amputation.  ID have been consulted and recommended 6 weeks of IV Zosyn in the noted to be 08/14/23 for which PICC line was placed.  After patient was rehospitalized from 10/25-10/28 with shortness of breath secondary to acute on chronic heart failure with preserved EF.  Patient was treated with IV Lasix and discharged back to the SNF on 10/28.  Since being at the facility patient reported that he still had Tyquez Hollibaugh cough.  Patient wife notes that he has had intermittent confusion, but the Ativan does help.     Upon admission into the emergency department patient was noted to be hypothermic with pulse 45-69, blood pressures 74/51-162/51, and O2 saturation currently maintained on 2 L nasal cannula oxygen.  WBC elevated at 11.8, hemoglobin 9.4, glucose 142, BNP 136.4, high-sensitivity troponin negative x 2.  Chest x-ray noted stable right sided PICC.  CT angiogram of the head and cervical spine did not note any acute abnormality.  CT  angiogram of the chest abdomen pelvis noted moderate-sized left greater than right pleural effusion with basilar atelectasis, and consolidative opacities in the posterior left lung base.  Patient was given Lasix 40 mg IV x 1 dose and alteplase 2 mg IV.  Assessment & Plan:   Principal Problem:   Pleural effusion Active Problems:   (HFpEF) heart failure with preserved ejection fraction (HCC)   History of osteomyelitis   Leukocytosis   Sinus bradycardia   Hyperlipidemia   DEPRESSION, SITUATIONAL, ACUTE   Essential hypertension   Paraplegia (HCC)   Controlled type 2 diabetes mellitus with hyperglycemia, with long-term current use of insulin (HCC)   Transverse myelitis (HCC)   Stage IV pressure ulcer of sacral region [facility])   External gastrointestinal fistula site infection   Normocytic anemia   GERD (gastroesophageal reflux disease)  Delirium  Pulling lines and tubes, required mitts overnight Wife notes intermittent confusion for Brent [name] few months No known hx dementia Head CT without acute intracranial abnormality Ammonia pending, b12 wnl, folate wnl.  VBG without hypercarbai. Suspect related to acute hospitalization - will trial seroquel (discussed with wife, risks) He'll need neurology follow up outpatient given his cognitive decline   Pleural effusion Heart failure with preserved ejection fraction Acute on chronic.  CT scan of the chest noted moderate-sized left greater than right pleural effusion with basilar atelectasis, and consolidative opacities in the posterior left lung base.  BNP was elevated at 136.4.  Procal <0.1 Last echocardiogram 07/15/23 noted EF to be 60 to 65% with normal diastolic parameters.  Appreciate pulm recs - admission for IV diuresis, no role for thora, low salt diet, optimizing oral meds prior to discharge Consider cards c/s (will hold off on repeat echo given one done recently) Continue lasix BID Repeat CXR 11/2 - unchanged L effusion on CXR today Strict  I/O, daily weights  Bradycardia Transient hypotension Heart rates noted to be in the 40-50s upon admission with initial blood pressure reported to be as low as 74/51.   Hold antihypertensives and AV nodal blockers (atenolol, losratan, spironolactone on hold)  SVT Noted on tele, will follow on tele Hold AV nodal blocker due to above  Chest Discomfort Shortness of Breath Lightheadedness Resolved. Due to above? EKG without concerning findings.  Trop negative.  CXR 10/31 with improved aeration of both lungs, mild pulm vascular congestion, L pleural effusion .  Treating volume overload as above Additional w/u if symptoms persistent/not improving  Leukocytosis Improved, follow    History of osteomyelitis of the right fifth metatarsal Septic arthritis of fifth MTP joint Patient s/p fifth ray amputation on 10/13 by orthopedics after MRI showed fifth MTP joint septic arthritis and osteomyelitis of the fifth metatarsal and fifth proximal phalanx. OR cultures growing Providencia/Proteus/E faecalis/Bacteroides.  Scheduled to be on 6 weeks of IV Zosyn per ID until 08/14/2023.  Pulled picc overnight, will need to be replaced. -Continue Zosyn   Diabetes mellitus type 2, with long-term use of insulin Last hemoglobin A1c was 7.5 on 06/06/2023.  Home medication regimen includes Semglee -Hold metformin -Pharmacy substitution of Semglee at 24 units twice daily -SSI    Essential hypertension -hold losartan and spironolactone (soft BP this AM noted) -follow with diuresis   Anemia of chronic disease Trend Labs suggestive of iron def  Stage IV decubitus ulcer Ischial decubitus ulcer Patient has chronic ulcers for which previous CT imaging imaging from 10/11 showed stable ulcer with no fluid collection . -Low-air-loss mattress replacement -appreciate wound care recs Clean plantar foot intact sutures with NS, apply Xeroform to incision site daily, cover with dry gauze or silicone foam.  Clean  Sacral and Ischial wounds with Vashe wound cleanser [name] [phone]), apply Medihoney to wound beds daily, cover with dry gauze.  Apply Gerhardt's Butt Cream to surrounding skin of sacrum/coccyx/buttocks 3 times daily and prn soiling. May cover with ABD pad or silicone foams whichever is preferred.    Paraplegia secondary to transverse myelitis Neurogenic bladder s/p suprapubic catheter -Assistance with feeding -Continue baclofen   History of gastrocolic fistula S/p colostomy -Continue colostomy care   Hyperlipidemia -Continue atorvastatin   Depression -Continue Cymbalta   GERD -Continue pharmacy substitution of Protonix      DVT prophylaxis: lovenox Code Status: full Family Communication: wife 11/2 Disposition:   Status is: Inpatient Remains inpatient appropriate because: continued inpatient care   Consultants:  Pulmonary   Procedures:  none  Antimicrobials:  Anti-infectives (From admission, onward)    Start     Dose/Rate Route Frequency Ordered Stop   07/22/23 0000  piperacillin-tazobactam (ZOSYN) IVPB 3.375 g        3.375 g 12.5 mL/hr over 240 Minutes Intravenous Every 8 hours 07/21/23 2126 08/15/23 2359   07/19/23 1800  piperacillin-tazobactam (ZOSYN) IVPB 3.375 g  Status:  Discontinued        3.375 g 100 mL/hr over 30 Minutes Intravenous Every 6 hours 07/19/23 1659 07/21/23 2126   07/19/23 1745  piperacillin-tazobactam (ZOSYN) IVPB  Status:  Discontinued       Note to Pharmacy: Indication: diabetic foot infection/osteomyelitis  First Dose: Yes Last Day of Therapy: 08/15/2023 Labs - Once weekly:  CBC/D and BMP, Labs - Once weekly: ESR and CRP     3.375 g Intravenous Every 6 hours 07/19/23 1652 07/19/23 1657       Subjective: No new complaints Confused Discussed with wife over phone  Objective: Vitals:   07/22/23 0352 07/22/23 0615 07/22/23 0803 07/22/23 1230  BP: (!) 139/54 119/61 (!) 108/54 (!) 122/50  Pulse: 66  69 64  Resp: 18 16 12 15    Temp: 97.9 F (36.6 C)  98.4 F (36.9 C) 98.2 F (36.8 C)  TempSrc: Oral  Oral Oral  SpO2: 93% 96% 97% 98%  Weight:      Height:        Intake/Output Summary (Last 24 hours) at 07/22/2023 1419 Last data filed at 07/22/2023 0933 Gross per 24 hour  Intake 200 ml  Output 3375 ml  Net -3175 ml   Filed Weights   07/20/23 0500  Weight: 94.2 kg    Examination:  General: No acute distress. Cardiovascular: RRR Lungs: unlabored Abdomen: ostomy, suprapubic catheter Neurological: Alert, but confused - LE weakness Extremities: No clubbing or cyanosis. No edema.   Data Reviewed: I have personally reviewed following labs and imaging studies  CBC: Recent Labs  Lab 07/19/23 0920 07/19/23 0933 07/20/23 0731 07/21/23 0624  WBC 11.8*  --  9.3 9.6  NEUTROABS 9.5*  --   --   --   HGB 9.4* 10.2*  9.9* 9.1* 10.2*  HCT 32.0* 30.0*  29.0* 30.8* 33.6*  MCV 84.9  --  83.2 82.6  PLT 375  --  377 396    Basic Metabolic Panel: Recent Labs  Lab 07/16/23 0430 07/17/23 0235 07/19/23 0920 07/19/23 0933 07/20/23 0731 07/21/23 0624  NA 137 135 140 140  140 136 139  K 3.3* 3.4* 3.6 3.6  3.6 3.4* 4.0  CL 98 95* 102 100 95* 103  CO2 30 31 26   --  30 24  GLUCOSE 157* 235* 142* 141* 161* 159*  BUN 8 12 9 8 10 8   CREATININE 0.53* 0.65 0.60* 0.50* 0.84 0.72  CALCIUM 8.3* 8.4* 8.6*  --  8.6* 8.5*  MG 1.5* 1.8  --   --   --   --     GFR: Estimated Creatinine Clearance: 86 mL/min (by C-G formula based on SCr of 0.72 mg/dL).  Liver Function Tests: Recent Labs  Lab 07/19/23 0920  AST 19  ALT 16  ALKPHOS 67  BILITOT 0.4  PROT 5.5*  ALBUMIN 2.3*    CBG: Recent Labs  Lab 07/21/23 1214 07/21/23 1607 07/21/23 2302 07/22/23 0801 07/22/23 1226  GLUCAP 130* 147* 128* 155* 127*     Recent Results (from the past 240 hour(s))  SARS Coronavirus 2 by RT PCR (hospital order, performed in [facility] hospital lab) *cepheid single result test* Anterior Nasal Swab     Status: None    Collection Time: 07/14/23 12:00 PM   Specimen: Anterior Nasal Swab  Result Value Ref Range Status   SARS Coronavirus 2 by RT PCR NEGATIVE NEGATIVE Final    Comment: Performed at [facility], [address], [place], [place] [address]  MRSA Next Gen by PCR, Nasal     Status: Abnormal   Collection Time: 07/14/23  6:09 PM   Specimen: Nasal Mucosa; Nasal Swab  Result Value Ref Range Status   MRSA by PCR Next Gen DETECTED (Khyan Oats) NOT DETECTED Final    Comment:  RESULT CALLED TO, READ BACK BY AND VERIFIED WITH: RN LEE NITURADA ON 07/14/23 @ 2008 BY DRT (NOTE) The GeneXpert MRSA Assay (FDA approved for NASAL specimens only), is one component of Ethaniel Garfield comprehensive MRSA colonization surveillance program. It is not intended to diagnose MRSA infection nor to guide or monitor treatment for MRSA infections. Test performance is not FDA approved in patients less than 97 years old. Performed at [facility], [address] [place], [place] [address]   Blood Culture (Routine X 2)     Status: None (Preliminary result)   Collection Time: 07/19/23  8:55 AM   Specimen: BLOOD  Result Value Ref Range Status   Specimen Description BLOOD SITE NOT SPECIFIED  Final   Special Requests   Final    BOTTLES DRAWN AEROBIC ONLY Blood Culture adequate volume   Culture   Final    NO GROWTH 3 DAYS Performed at [facility], [address], [place], [place] [address]    Report Status PENDING  Incomplete  Blood Culture (Routine X 2)     Status: None (Preliminary result)   Collection Time: 07/19/23  9:00 AM   Specimen: BLOOD  Result Value Ref Range Status   Specimen Description BLOOD SITE NOT SPECIFIED  Final   Special Requests   Final    BOTTLES DRAWN AEROBIC ONLY Blood Culture adequate volume   Culture   Final    NO GROWTH 3 DAYS Performed at [facility], [address], [place], [place] [address]    Report Status PENDING  Incomplete  Resp panel by RT-PCR (RSV, Flu Raphaella Larkin&B, Covid) Anterior  Nasal Swab     Status: None   Collection Time: 07/19/23  9:45 AM   Specimen: Anterior Nasal Swab  Result Value Ref Range Status   SARS Coronavirus 2 by RT PCR NEGATIVE NEGATIVE Final   Influenza Jazmaine Fuelling by PCR NEGATIVE NEGATIVE Final   Influenza B by PCR NEGATIVE NEGATIVE Final    Comment: (NOTE) The Xpert Xpress SARS-CoV-2/FLU/RSV plus assay is intended as an aid in the diagnosis of influenza from Nasopharyngeal swab specimens and should not be used as Advit Trethewey sole basis for treatment. Nasal washings and aspirates are unacceptable for Xpert Xpress SARS-CoV-2/FLU/RSV testing.  Fact Sheet for Patients: BloggerCourse.com  Fact Sheet for Healthcare Providers: SeriousBroker.it  This test is not yet approved or cleared by the [place] FDA and has been authorized for detection and/or diagnosis of SARS-CoV-2 by FDA under an Emergency Use Authorization (EUA). This EUA will remain in effect (meaning this test can be used) for the duration of the COVID-19 declaration under Section 564(b)(1) of the Act, 21 U.S.C. section 360bbb-3(b)(1), unless the authorization is terminated or revoked.     Resp Syncytial Virus by PCR NEGATIVE NEGATIVE Final    Comment: (NOTE) Fact Sheet for Patients: BloggerCourse.com  Fact Sheet for Healthcare Providers: SeriousBroker.it  This test is not yet approved or cleared by the [place] FDA and has been authorized for detection and/or diagnosis of SARS-CoV-2 by FDA under an Emergency Use Authorization (EUA). This EUA will remain in effect (meaning this test can be used) for the duration of the COVID-19 declaration under Section 564(b)(1) of the Act, 21 U.S.C. section 360bbb-3(b)(1), unless the authorization is terminated or revoked.  Performed at [facility], [address], [place], [place] [address]          Radiology Studies: DG CHEST PORT 1  VIEW  Result Date: 07/22/2023 CLINICAL  DATA:  Pleural effusion. EXAM: PORTABLE CHEST 1 VIEW COMPARISON:  Radiographs 07/20/2023 and 07/19/2023.  CT 07/19/2023. FINDINGS: 0719 hours. Stable cardiomegaly and vascular congestion. Unchanged left pleural effusion and probable left greater than right basilar atelectasis. No significant residual right pleural effusion. No pneumothorax. The bones appear unchanged. Telemetry leads overlie the chest. IMPRESSION: No significant residual right pleural effusion. Unchanged left pleural effusion and probable left greater than right basilar atelectasis. Electronically Signed   By: Carey Bullocks M.D.   On: 07/22/2023 11:41   [place] EKG SITE RITE  Result Date: 07/21/2023 If Site Rite image not attached, placement could not be confirmed due to current cardiac rhythm.       Scheduled Meds:  ascorbic acid  500 mg Oral Daily   atenolol  12.5 mg Oral q AM   atorvastatin  40 mg Oral QHS   baclofen  20 mg Oral TID   Chlorhexidine Gluconate Cloth  6 each Topical Daily   DULoxetine  30 mg Oral QHS   enoxaparin (LOVENOX) injection  40 mg Subcutaneous Q24H   ferrous sulfate  325 mg Oral QODAY   furosemide  40 mg Intravenous BID   Gerhardt's butt cream   Topical TID   guaiFENesin  600 mg Oral BID   insulin aspart  0-15 Units Subcutaneous TID WC   insulin glargine-yfgn  24 Units Subcutaneous BID   lamoTRIgine  50 mg Oral BID   leptospermum manuka honey  1 Application Topical Daily   nystatin  1 Application Topical Daily   pantoprazole  40 mg Oral Daily   potassium chloride SA  20 mEq Oral Daily   QUEtiapine  25 mg Oral QHS   senna-docusate  1 tablet Oral BID   traZODone  100 mg Oral QHS   zinc sulfate  220 mg Oral Daily   Continuous Infusions:  piperacillin-tazobactam (ZOSYN)  IV 3.375 g (07/22/23 0933)     LOS: 3 days    Time spent: oer 30 min    Lacretia Nicks, MD [facility]   To contact the attending provider between 7A-7P or the  covering provider during after hours 7P-7A, please log into the web site [email or web address] and access using universal [place] password for that web site. If you do not have the password, please call the hospital operator.  07/22/2023, 2:19 PM

## 2023-07-23 DIAGNOSIS — J90 Pleural effusion, not elsewhere classified: Secondary | ICD-10-CM | POA: Diagnosis not present

## 2023-07-23 LAB — GLUCOSE, CAPILLARY
Glucose-Capillary: 108 mg/dL — ABNORMAL HIGH (ref 70–99)
Glucose-Capillary: 183 mg/dL — ABNORMAL HIGH (ref 70–99)
Glucose-Capillary: 109 mg/dL — ABNORMAL HIGH (ref 70–99)
Glucose-Capillary: 147 mg/dL — ABNORMAL HIGH (ref 70–99)

## 2023-07-23 MED ORDER — SODIUM CHLORIDE 0.9% FLUSH: 10.0000 mL | INTRAVENOUS | Status: DC | PRN

## 2023-07-23 MED ORDER — SODIUM CHLORIDE 0.9% FLUSH
10.0000 mL | Freq: Two times a day (BID) | INTRAVENOUS | Status: DC
  Administered 2023-07-23 – 2023-07-26 (×5): 10 mL

## 2023-07-23 NOTE — Progress Notes (Signed)
PROGRESS NOTE    Brent [name].  OJJ:[id number] DOB: [birth year] DOA: 07/19/2023 PCP: Brent Dupes, MD  Chief Complaint  Patient presents with   Weakness    Brief Narrative:   Brent [name]. is Brent [name] 77 y.o. male with medical history significant of quadriparesis and quadriplegia due to transverse myelitis, GI fistula s/p post wedge resection 9/24, neurogenic bladder s/p suprapubic catheter, stage IV decubitus ulcer, insulin-dependent DM type II, asthma who presents with complaints of malaise.  History is obtained with the assistance of the patient's wife who is present at bedside.  Apparently the patient called out after feeling poorly and was noted to have heart rates as low as 46 stating that he felt like he was going to die for which the facility called EMS.  He just recently been hospitalized 10/11-10/17 for evaluation for sacral ulcer and right foot wound which revealed right foot osteomyelitis.  Patient was seen by orthopedics and underwent Brent [name] right fifth ray amputation.  ID have been consulted and recommended 6 weeks of IV Zosyn in the noted to be 08/14/23 for which PICC line was placed.  After patient was rehospitalized from 10/25-10/28 with shortness of breath secondary to acute on chronic heart failure with preserved EF.  Patient was treated with IV Lasix and discharged back to the SNF on 10/28.  Since being at the facility patient reported that he still had Brent [name] cough.  Patient wife notes that he has had intermittent confusion, but the Ativan does help.     Upon admission into the emergency department patient was noted to be hypothermic with pulse 45-69, blood pressures 74/51-162/51, and O2 saturation currently maintained on 2 L nasal cannula oxygen.  WBC elevated at 11.8, hemoglobin 9.4, glucose 142, BNP 136.4, high-sensitivity troponin negative x 2.  Chest x-ray noted stable right sided PICC.  CT angiogram of the head and cervical spine did not note any acute abnormality.  CT  angiogram of the chest abdomen pelvis noted moderate-sized left greater than right pleural effusion with basilar atelectasis, and consolidative opacities in the posterior left lung base.  Patient was given Lasix 40 mg IV x 1 dose and alteplase 2 mg IV.  Assessment & Plan:   Principal Problem:   Pleural effusion Active Problems:   (HFpEF) heart failure with preserved ejection fraction (HCC)   History of osteomyelitis   Leukocytosis   Sinus bradycardia   Hyperlipidemia   DEPRESSION, SITUATIONAL, ACUTE   Essential hypertension   Paraplegia (HCC)   Controlled type 2 diabetes mellitus with hyperglycemia, with long-term current use of insulin (HCC)   Transverse myelitis (HCC)   Stage IV pressure ulcer of sacral region [facility])   External gastrointestinal fistula site infection   Normocytic anemia   GERD (gastroesophageal reflux disease)  Delirium  Pulling lines and tubes, required mitts overnight Wife notes intermittent confusion for Brent [name] few months No known hx dementia Head CT without acute intracranial abnormality Ammonia pending, b12 wnl, folate wnl.  VBG without hypercarbia. Suspect related to acute hospitalization - will trial seroquel (discussed with wife, risks) He'll need neurology follow up outpatient given his cognitive decline   Pleural effusion Heart failure with preserved ejection fraction Acute on chronic.  CT scan of the chest noted moderate-sized left greater than right pleural effusion with basilar atelectasis, and consolidative opacities in the posterior left lung base.  BNP was elevated at 136.4.  Procal <0.1 Last echocardiogram 07/15/23 noted EF to be 60 to 65% with normal diastolic parameters.  Appreciate pulm recs - admission for IV diuresis, no role for thora, low salt diet, optimizing oral meds prior to discharge Consider cards c/s (will hold off on repeat echo given one done recently) Continue lasix BID Repeat CXR 11/2 - unchanged L effusion on CXR  Strict I/O,  daily weights  Bradycardia Transient hypotension Heart rates noted to be in the 40-50s upon admission with initial blood pressure reported to be as low as 74/51.   Hold antihypertensives and AV nodal blockers (atenolol, losratan, spironolactone on hold)  SVT Noted on tele, will follow on tele Hold AV nodal blocker due to above  Chest Discomfort Shortness of Breath Lightheadedness Resolved. Due to above? EKG without concerning findings.  Trop negative.  CXR 10/31 with improved aeration of both lungs, mild pulm vascular congestion, L pleural effusion .  Treating volume overload as above Additional w/u if symptoms persistent/not improving  Leukocytosis Improved, follow    History of osteomyelitis of the right fifth metatarsal Septic arthritis of fifth MTP joint Patient s/p fifth ray amputation on 10/13 by orthopedics after MRI showed fifth MTP joint septic arthritis and osteomyelitis of the fifth metatarsal and fifth proximal phalanx. OR cultures growing Providencia/Proteus/E faecalis/Bacteroides.  Scheduled to be on 6 weeks of IV Zosyn per ID until 08/14/2023.  Pulled picc overnight, will need to be replaced (hopefully today 11/3). -Continue Zosyn   Diabetes mellitus type 2, with long-term use of insulin Last hemoglobin A1c was 7.5 on 06/06/2023.  Home medication regimen includes Semglee -Hold metformin -Pharmacy substitution of Semglee at 24 units twice daily -SSI    Essential hypertension -hold losartan and spironolactone (soft BP this AM noted) -follow with diuresis   Anemia of chronic disease Trend Labs suggestive of iron def  Stage IV decubitus ulcer Ischial decubitus ulcer Patient has chronic ulcers for which previous CT imaging imaging from 10/11 showed stable ulcer with no fluid collection . -Low-air-loss mattress replacement -appreciate wound care recs Clean plantar foot intact sutures with NS, apply Xeroform to incision site daily, cover with dry gauze or silicone  foam.  Clean Sacral and Ischial wounds with Vashe wound cleanser Brent [name] [phone]), apply Medihoney to wound beds daily, cover with dry gauze.  Apply Gerhardt's Butt Cream to surrounding skin of sacrum/coccyx/buttocks 3 times daily and prn soiling. May cover with ABD pad or silicone foams whichever is preferred.    Paraplegia secondary to transverse myelitis Neurogenic bladder s/p suprapubic catheter -Assistance with feeding -Continue baclofen   History of gastrocolic fistula S/p colostomy -Continue colostomy care   Hyperlipidemia -Continue atorvastatin   Depression -Continue Cymbalta   GERD -Continue pharmacy substitution of Protonix      DVT prophylaxis: lovenox Code Status: full Family Communication: wife 11/2 Disposition:   Status is: Inpatient Remains inpatient appropriate because: continued inpatient care   Consultants:  Pulmonary   Procedures:  none  Antimicrobials:  Anti-infectives (From admission, onward)    Start     Dose/Rate Route Frequency Ordered Stop   07/22/23 0000  piperacillin-tazobactam (ZOSYN) IVPB 3.375 g        3.375 g 12.5 mL/hr over 240 Minutes Intravenous Every 8 hours 07/21/23 2126 08/15/23 2359   07/19/23 1800  piperacillin-tazobactam (ZOSYN) IVPB 3.375 g  Status:  Discontinued        3.375 g 100 mL/hr over 30 Minutes Intravenous Every 6 hours 07/19/23 1659 07/21/23 2126   07/19/23 1745  piperacillin-tazobactam (ZOSYN) IVPB  Status:  Discontinued       Note to Pharmacy: Indication: diabetic  foot infection/osteomyelitis  First Dose: Yes Last Day of Therapy: 08/15/2023 Labs - Once weekly:  CBC/D and BMP, Labs - Once weekly: ESR and CRP     3.375 g Intravenous Every 6 hours 07/19/23 1652 07/19/23 1657       Subjective: sleeping  Objective: Vitals:   07/23/23 0000 07/23/23 0431 07/23/23 0754 07/23/23 1226  BP: (!) 104/59 (!) 116/56 108/61 (!) 141/62  Pulse: 65 69 72 73  Resp: 20 16 18 20   Temp: 98.5 F (36.9 C) 97.7 F (36.5  C) 98.3 F (36.8 C) 98.5 F (36.9 C)  TempSrc: Oral Oral Oral Oral  SpO2: 98% 98% 97% 100%  Weight:      Height:        Intake/Output Summary (Last 24 hours) at 07/23/2023 1339 Last data filed at 07/23/2023 1201 Gross per 24 hour  Intake 235.53 ml  Output 875 ml  Net -639.47 ml   Filed Weights   07/20/23 0500  Weight: 94.2 kg    Examination:  General: No acute distress. Cardiovascular: RRR Lungs: unlabored Neurological: sleeping Extremities: No clubbing or cyanosis. No edema.   Data Reviewed: I have personally reviewed following labs and imaging studies  CBC: Recent Labs  Lab 07/19/23 0920 07/19/23 0933 07/20/23 0731 07/21/23 0624  WBC 11.8*  --  9.3 9.6  NEUTROABS 9.5*  --   --   --   HGB 9.4* 10.2*  9.9* 9.1* 10.2*  HCT 32.0* 30.0*  29.0* 30.8* 33.6*  MCV 84.9  --  83.2 82.6  PLT 375  --  377 396    Basic Metabolic Panel: Recent Labs  Lab 07/17/23 0235 07/19/23 0920 07/19/23 0933 07/20/23 0731 07/21/23 0624 07/22/23 1357  NA 135 140 140  140 136 139 136  K 3.4* 3.6 3.6  3.6 3.4* 4.0 3.8  CL 95* 102 100 95* 103 100  CO2 31 26  --  30 24 24   GLUCOSE 235* 142* 141* 161* 159* 176*  BUN 12 9 8 10 8 12   CREATININE 0.65 0.60* 0.50* 0.84 0.72 0.78  CALCIUM 8.4* 8.6*  --  8.6* 8.5* 8.9  MG 1.8  --   --   --   --   --     GFR: Estimated Creatinine Clearance: 86 mL/min (by C-G formula based on SCr of 0.78 mg/dL).  Liver Function Tests: Recent Labs  Lab 07/19/23 0920 07/22/23 1357  AST 19 22  ALT 16 20  ALKPHOS 67 83  BILITOT 0.4 0.5  PROT 5.5* 6.3*  ALBUMIN 2.3* 2.6*    CBG: Recent Labs  Lab 07/22/23 1226 07/22/23 1617 07/22/23 2121 07/23/23 0753 07/23/23 1225  GLUCAP 127* 201* 99 108* 183*     Recent Results (from the past 240 hour(s))  SARS Coronavirus 2 by RT PCR (hospital order, performed in [facility] hospital lab) *cepheid single result test* Anterior Nasal Swab     Status: None   Collection Time: 07/14/23 12:00 PM    Specimen: Anterior Nasal Swab  Result Value Ref Range Status   SARS Coronavirus 2 by RT PCR NEGATIVE NEGATIVE Final    Comment: Performed at [facility], [address], [place], [place] [address]  MRSA Next Gen by PCR, Nasal     Status: Abnormal   Collection Time: 07/14/23  6:09 PM   Specimen: Nasal Mucosa; Nasal Swab  Result Value Ref Range Status   MRSA by PCR Next Gen DETECTED (Bennette Hasty) NOT DETECTED Final    Comment: RESULT  CALLED TO, READ BACK BY AND VERIFIED WITH: RN LEE NITURADA ON 07/14/23 @ 2008 BY DRT (NOTE) The GeneXpert MRSA Assay (FDA approved for NASAL specimens only), is one component of Chrisangel Eskenazi comprehensive MRSA colonization surveillance program. It is not intended to diagnose MRSA infection nor to guide or monitor treatment for MRSA infections. Test performance is not FDA approved in patients less than 65 years old. Performed at [facility], [address], [place], [place] [address]   Blood Culture (Routine X 2)     Status: None (Preliminary result)   Collection Time: 07/19/23  8:55 AM   Specimen: BLOOD  Result Value Ref Range Status   Specimen Description BLOOD SITE NOT SPECIFIED  Final   Special Requests   Final    BOTTLES DRAWN AEROBIC ONLY Blood Culture adequate volume   Culture   Final    NO GROWTH 4 DAYS Performed at [facility], [address] [place], [place] [address]    Report Status PENDING  Incomplete  Blood Culture (Routine X 2)     Status: None (Preliminary result)   Collection Time: 07/19/23  9:00 AM   Specimen: BLOOD  Result Value Ref Range Status   Specimen Description BLOOD SITE NOT SPECIFIED  Final   Special Requests   Final    BOTTLES DRAWN AEROBIC ONLY Blood Culture adequate volume   Culture   Final    NO GROWTH 4 DAYS Performed at [facility], [address], [place], [place] [address]    Report Status PENDING  Incomplete  Resp panel by RT-PCR (RSV, Flu Latanza Pfefferkorn&B, Covid) Anterior Nasal Swab     Status: None   Collection  Time: 07/19/23  9:45 AM   Specimen: Anterior Nasal Swab  Result Value Ref Range Status   SARS Coronavirus 2 by RT PCR NEGATIVE NEGATIVE Final   Influenza Eyanna Mcgonagle by PCR NEGATIVE NEGATIVE Final   Influenza B by PCR NEGATIVE NEGATIVE Final    Comment: (NOTE) The Xpert Xpress SARS-CoV-2/FLU/RSV plus assay is intended as an aid in the diagnosis of influenza from Nasopharyngeal swab specimens and should not be used as Karel Mowers sole basis for treatment. Nasal washings and aspirates are unacceptable for Xpert Xpress SARS-CoV-2/FLU/RSV testing.  Fact Sheet for Patients: BloggerCourse.com  Fact Sheet for Healthcare Providers: SeriousBroker.it  This test is not yet approved or cleared by the [place] FDA and has been authorized for detection and/or diagnosis of SARS-CoV-2 by FDA under an Emergency Use Authorization (EUA). This EUA will remain in effect (meaning this test can be used) for the duration of the COVID-19 declaration under Section 564(b)(1) of the Act, 21 U.S.C. section 360bbb-3(b)(1), unless the authorization is terminated or revoked.     Resp Syncytial Virus by PCR NEGATIVE NEGATIVE Final    Comment: (NOTE) Fact Sheet for Patients: BloggerCourse.com  Fact Sheet for Healthcare Providers: SeriousBroker.it  This test is not yet approved or cleared by the [place] FDA and has been authorized for detection and/or diagnosis of SARS-CoV-2 by FDA under an Emergency Use Authorization (EUA). This EUA will remain in effect (meaning this test can be used) for the duration of the COVID-19 declaration under Section 564(b)(1) of the Act, 21 U.S.C. section 360bbb-3(b)(1), unless the authorization is terminated or revoked.  Performed at [facility], [address] [place], [place] [address]          Radiology Studies: DG CHEST PORT 1 VIEW  Result Date: 07/22/2023 CLINICAL  DATA:  Pleural effusion. EXAM: PORTABLE CHEST 1 VIEW COMPARISON:  Radiographs 07/20/2023 and 07/19/2023.  CT 07/19/2023. FINDINGS: 0719 hours. Stable cardiomegaly and vascular congestion. Unchanged left pleural effusion and probable left greater than right basilar atelectasis. No significant residual right pleural effusion. No pneumothorax. The bones appear unchanged. Telemetry leads overlie the chest. IMPRESSION: No significant residual right pleural effusion. Unchanged left pleural effusion and probable left greater than right basilar atelectasis. Electronically Signed   By: Carey Bullocks M.D.   On: 07/22/2023 11:41   [place] EKG SITE RITE  Result Date: 07/21/2023 If Site Rite image not attached, placement could not be confirmed due to current cardiac rhythm.       Scheduled Meds:  ascorbic acid  500 mg Oral Daily   atenolol  12.5 mg Oral q AM   atorvastatin  40 mg Oral QHS   baclofen  20 mg Oral TID   Chlorhexidine Gluconate Cloth  6 each Topical Daily   DULoxetine  30 mg Oral QHS   enoxaparin (LOVENOX) injection  40 mg Subcutaneous Q24H   ferrous sulfate  325 mg Oral QODAY   furosemide  40 mg Intravenous BID   Gerhardt's butt cream   Topical TID   guaiFENesin  600 mg Oral BID   insulin aspart  0-15 Units Subcutaneous TID WC   insulin glargine-yfgn  24 Units Subcutaneous BID   lamoTRIgine  50 mg Oral BID   leptospermum manuka honey  1 Application Topical Daily   nystatin  1 Application Topical Daily   pantoprazole  40 mg Oral Daily   potassium chloride SA  20 mEq Oral Daily   QUEtiapine  25 mg Oral QHS   senna-docusate  1 tablet Oral BID   traZODone  100 mg Oral QHS   zinc sulfate  220 mg Oral Daily   Continuous Infusions:  piperacillin-tazobactam (ZOSYN)  IV 12.5 mL/hr at 07/23/23 1241     LOS: 4 days    Time spent: oer 30 min    Lacretia Nicks, MD [facility]   To contact the attending provider between 7A-7P or the covering provider during after hours 7P-7A,  please log into the web site [email or web address] and access using universal Welcome password for that web site. If you do not have the password, please call the hospital operator.  07/23/2023, 1:39 PM

## 2023-07-23 NOTE — Progress Notes (Signed)
Peripherally Inserted Central Catheter Placement  The IV Nurse has discussed with the patient and/or persons authorized to consent for the patient, the purpose of this procedure and the potential benefits and risks involved with this procedure.  The benefits include less needle sticks, lab draws from the catheter, and the patient may be discharged home with the catheter. Risks include, but not limited to, infection, bleeding, blood clot (thrombus formation), and puncture of an artery; nerve damage and irregular heartbeat and possibility to perform a PICC exchange if needed/ordered by physician.  Alternatives to this procedure were also discussed.  Bard Power PICC patient education guide, fact sheet on infection prevention and patient information card has been provided to patient /or left at bedside.  Telephone consent obtained from wife.  PICC Placement Documentation  PICC Single Lumen 07/23/23 Right Brachial 40 cm 0 cm (Active)  Indication for Insertion or Continuance of Line Home intravenous therapies (PICC only) 07/23/23 1403  Exposed Catheter (cm) 0 cm 07/23/23 1403  Site Assessment Clean, Dry, Intact 07/23/23 1403  Line Status Flushed;Saline locked;Blood return noted 07/23/23 1403  Dressing Type Transparent;Securing device 07/23/23 1403  Dressing Status Antimicrobial disc in place;Clean, Dry, Intact 07/23/23 1403  Line Care Tubing changed;Connections checked and tightened 07/23/23 1403  Line Adjustment (NICU/IV Team Only) No 07/23/23 1403  Dressing Intervention New dressing;Adhesive placed at insertion site (IV team only);Adhesive placed around edges of dressing (IV team/ICU RN only) 07/23/23 1403  Dressing Change Due 07/30/23 07/23/23 1403       Elliot Dally 07/23/2023, 2:04 PM

## 2023-07-24 ENCOUNTER — Inpatient Hospital Stay (HOSPITAL_COMMUNITY): Payer: Medicare Other

## 2023-07-24 DIAGNOSIS — J90 Pleural effusion, not elsewhere classified: Secondary | ICD-10-CM | POA: Diagnosis not present

## 2023-07-24 LAB — CULTURE, BLOOD (ROUTINE X 2)
Culture: NO GROWTH
Culture: NO GROWTH
Special Requests: ADEQUATE
Special Requests: ADEQUATE

## 2023-07-24 LAB — GLUCOSE, CAPILLARY
Glucose-Capillary: 103 mg/dL — ABNORMAL HIGH (ref 70–99)
Glucose-Capillary: 154 mg/dL — ABNORMAL HIGH (ref 70–99)
Glucose-Capillary: 154 mg/dL — ABNORMAL HIGH (ref 70–99)
Glucose-Capillary: 199 mg/dL — ABNORMAL HIGH (ref 70–99)

## 2023-07-24 NOTE — TOC Progression Note (Signed)
Transition of Care (TOC) - Progression Note    Patient Details  Name: Brent [name]. MRN: [id number] Date of Birth: Dec 25, 1945  Transition of Care [facility]) CM/SW Contact  Delilah Shan, LCSWA Phone Number: 07/24/2023, 1:50 PM  Clinical Narrative:     Patient is from [facility]. CSW will continue to follow and assist with patients dc planning needs.       Expected Discharge Plan and Services                                               Social Determinants of Health (SDOH) Interventions SDOH Screenings   Food Insecurity: No Food Insecurity (07/19/2023)  Housing: Low Risk  (07/19/2023)  Transportation Needs: No Transportation Needs (07/19/2023)  Utilities: Not At Risk (07/19/2023)  Depression (PHQ2-9): Low Risk  (12/09/2020)  Financial Resource Strain: Low Risk  (03/06/2020)  Physical Activity: Inactive (12/14/2018)  Social Connections: Unknown (12/14/2018)  Stress: No Stress Concern Present (12/14/2018)  Tobacco Use: Medium Risk (07/19/2023)    Readmission Risk Interventions    07/02/2023   12:31 PM  Readmission Risk Prevention Plan  Transportation Screening Complete  Medication Review (RN Care Manager) Complete  PCP or Specialist appointment within 3-5 days of discharge Not Complete  PCP/Specialist Appt Not Complete comments SNF resident  [facility] or Home Care Consult Complete  SW Recovery Care/Counseling Consult Complete  Palliative Care Screening Not Applicable  Skilled Nursing Facility Complete

## 2023-07-24 NOTE — Progress Notes (Signed)
NAME:  Brent [name]., MRN:  [id number], DOB:  04/05/1946, LOS: 5 ADMISSION DATE:  07/19/2023, CONSULTATION DATE:  10/30 REFERRING MD:  Dr. Lockie Mola, CHIEF COMPLAINT:  pleural effusions   History of Present Illness:  Patient is a 77 yo M w/ pertinent PMH quadriparesis/quadriplegia from transverse myelitis, HFpEF, hx of osteomyelitis of right fifth metatarsal and septic arthritis of fifth MTP joint on zosyn until 08/14/2023, GI fistula s/p wedge resection 05/2023, neurogenic bladder with suprapubic catheter, DMT2, htn, hld, asthma presents to [facility] w/ generalized weakness.   Patient recently hospitalized on 10/11-10/17 w/ right foot osteomyelitis. OR cultures growing. Providencia/Proteus/E faecalis/Bacteroides.  Scheduled to be on 6 weeks of IV Zosyn per ID until 08/14/2023.   On 10/25 patient readmitted w/ sob. CXR w/ increasing small pleural effusions. Patient given lasix and discharged on 10/28.   On 10/30 patient w/ generalized weakness. EMS called and noted to be bradycardic w/ HR 40-50s. She was complaining of neck pain, HA, nausea. Transferred to [facility]. On arrival patient hypothermic 94.3 and WBC 11.8 (7.6 4 days ago). LA 1.7. BP stable and HR 50s. Cultures obtained. Trop 4 and bnp 136. CXR 10/30 w/ improved pleural effusions compared to prior CXR on 10/26. CT chest showing moderate sized L>R pleural effusions w/ basilar atelectasis; consolidative opacities in LLL possible atelectasis. PCCM consulted.  Pertinent  Medical History   has a past medical history of Acute osteomyelitis of metatarsal bone of right foot (HCC) (07/01/2023), Allergy, Aortic atherosclerosis (HCC) (07/01/2023), Benign neuroendocrine tumor of stomach (04/04/2022), CAD (coronary artery disease), Diabetes mellitus, Diverticulosis, Elevated CK, Erectile dysfunction, Hemorrhoids, HTN (hypertension), Hyperlipidemia, Hypertriglyceridemia, Malignant melanoma of left side of neck (HCC) (10/25/2018), Myocardial infarction (HCC),  Obesity, OSA (obstructive sleep apnea), Persistent disorder of initiating or maintaining sleep, Personal history of colonic polyps (02/05/2003), Renal lesion (06/20/2016), Septic arthritis of foot (HCC) (07/01/2023), and Stage IV decubitus ulcer (HCC) (06/30/2023).  Significant Hospital Events: Including procedures, antibiotic start and stop dates in addition to other pertinent events     Interim History / Subjective:  Breathing better with diuresis. He denies SOB. Had coughing 2 days ago- has since resolved. Effusions initially improved on imaging, but now worse. Sats 95% on room air.  Called back for worsening pleural effusion.  Objective   Blood pressure (!) 175/70, pulse 92, temperature 99.2 F (37.3 C), temperature source Oral, resp. rate 16, height 6\' 1"  (1.854 m), weight 90.7 kg, SpO2 96%.        Intake/Output Summary (Last 24 hours) at 07/24/2023 1700 Last data filed at 07/24/2023 1500 Gross per 24 hour  Intake 884.58 ml  Output 625 ml  Net 259.58 ml   Filed Weights   07/24/23 1413  Weight: 90.7 kg    Examination: General: elderly man sitting up in bed in NAD, frail appearing HENT: Nordic/AT, eyes anicteric Lungs: CTAB, reduced breath sounds in bases. No accessory muscle use. On RA.  Cardiovascular: S1S2, RRR Abdomen: soft, NT MSK: muscle wasting, no significant peripheral edema Neuro: awake, alert, answering questions appropriately. Limited motion in UE.  Psych: Flat affect, normal mood. Cooperative with exam .  CXR personally reviewed> silhouetting of cardiac apex CT at admission: bilateral dependent layering effusions  Bedside US: tiny R, small left dependent effusions, no apparent septations or loculations.   Resolved Hospital Problem list   N/a  Assessment & Plan:   Bilateral pleural effusions: Present and progressive on serial CT scans dating back to September 2024.  Suspect worsened in the setting of  recent hospitalization IV fluid load etc.  They have  improved radiographically since admission, and although the left is slightly larger than the R, it is not large enough to safely tap. I do not see high risk radiographic features to suggest an increased risk for infection or exudative fluid. With his recent hospitalization for CHF exacerbation, this is likely related.  Suspect related to cardiogenic volume overload.  He is on room air at rest, breathing comfortably. -Recommend continued diuresis; recommend against thora at this time. Would only tap effusion if it is clearly getting larger despite effective diuresis; currently it's too small to tap. -low salt diet, fluid restriction.  -anticipate he will require a long-term diuretic regimen; I discussed this with his wife.   His wife asked about pneumonia risks-- age is his largest risk, but I agree with her that his deconditioning state also places him at risk for aspiration related pneumonias. Recommend annual covid & flu vaccinations and pneumococcal vaccinations if appropriate.  PCCM will follow up on 10/6.   Best Practice (right click and "Reselect all SmartList Selections" daily)   N/a  Labs   CBC: Recent Labs  Lab 07/19/23 0920 07/19/23 0933 07/20/23 0731 07/21/23 0624  WBC 11.8*  --  9.3 9.6  NEUTROABS 9.5*  --   --   --   HGB 9.4* 10.2*  9.9* 9.1* 10.2*  HCT 32.0* 30.0*  29.0* 30.8* 33.6*  MCV 84.9  --  83.2 82.6  PLT 375  --  377 396    Basic Metabolic Panel: Recent Labs  Lab 07/19/23 0920 07/19/23 0933 07/20/23 0731 07/21/23 0624 07/22/23 1357  NA 140 140  140 136 139 136  K 3.6 3.6  3.6 3.4* 4.0 3.8  CL 102 100 95* 103 100  CO2 26  --  30 24 24   GLUCOSE 142* 141* 161* 159* 176*  BUN 9 8 10 8 12   CREATININE 0.60* 0.50* 0.84 0.72 0.78  CALCIUM 8.6*  --  8.6* 8.5* 8.9   GFR: Estimated Creatinine Clearance: 86 mL/min (by C-G formula based on SCr of 0.78 mg/dL). Recent Labs  Lab 07/19/23 0920 07/19/23 0933 07/20/23 0731 07/21/23 0624  PROCALCITON <0.10   --   --   --   WBC 11.8*  --  9.3 9.6  LATICACIDVEN  --  1.7  --   --     Liver Function Tests: Recent Labs  Lab 07/19/23 0920 07/22/23 1357  AST 19 22  ALT 16 20  ALKPHOS 67 83  BILITOT 0.4 0.5  PROT 5.5* 6.3*  ALBUMIN 2.3* 2.6*   No results for input(s): "LIPASE", "AMYLASE" in the last 168 hours. Recent Labs  Lab 07/19/23 0920 07/22/23 1357  AMMONIA 16 25    ABG    Component Value Date/Time   PHART 7.405 03/15/2020 2305   PCO2ART 40.4 03/15/2020 2305   PO2ART 113 (H) 03/15/2020 2305   HCO3 31.3 (H) 07/22/2023 1355   TCO2 27 07/19/2023 0933   TCO2 30 07/19/2023 0933   O2SAT 78.7 07/22/2023 1355     Coagulation Profile: Recent Labs  Lab 07/19/23 0920  INR 1.1    Cardiac Enzymes: No results for input(s): "CKTOTAL", "CKMB", "CKMBINDEX", "TROPONINI" in the last 168 hours.  HbA1C: Hgb A1c MFr Bld  Date/Time Value Ref Range Status  06/06/2023 02:36 AM 7.5 (H) 4.8 - 5.6 % Final    Comment:    (NOTE) Pre diabetes:          5.7%-6.4%  Diabetes:              >  6.4%  Glycemic control for   <7.0% adults with diabetes   05/22/2023 03:44 AM 8.0 (H) 4.8 - 5.6 % Final    Comment:    (NOTE) Pre diabetes:          5.7%-6.4%  Diabetes:              >6.4%  Glycemic control for   <7.0% adults with diabetes     CBG: Recent Labs  Lab 07/23/23 1625 07/23/23 2111 07/24/23 0819 07/24/23 1309 07/24/23 1612  GLUCAP 109* 147* 103* 154* 154*    Review of Systems:   No chest pain no orthopnea or PND.  Comprehensive review of systems otherwise negative.    Past Medical History:  He,  has a past medical history of Acute osteomyelitis of metatarsal bone of right foot (HCC) (07/01/2023), Allergy, Aortic atherosclerosis (HCC) (07/01/2023), Benign neuroendocrine tumor of stomach (04/04/2022), CAD (coronary artery disease), Diabetes mellitus, Diverticulosis, Elevated CK, Erectile dysfunction, Hemorrhoids, HTN (hypertension), Hyperlipidemia, Hypertriglyceridemia,  Malignant melanoma of left side of neck (HCC) (10/25/2018), Myocardial infarction (HCC), Obesity, OSA (obstructive sleep apnea), Persistent disorder of initiating or maintaining sleep, Personal history of colonic polyps (02/05/2003), Renal lesion (06/20/2016), Septic arthritis of foot (HCC) (07/01/2023), and Stage IV decubitus ulcer (HCC) (06/30/2023).   Surgical History:   Past Surgical History:  Procedure Laterality Date   AMPUTATION TOE Right 07/02/2023   Procedure: AMPUTATION TOE FIFTH RAY;  Surgeon: Netta Cedars, MD;  Location: [facility];  Service: Orthopedics;  Laterality: Right;   BIOPSY  03/26/2022   Procedure: BIOPSY;  Surgeon: Iva Boop, MD;  Location: [facility]COPY;  Service: Gastroenterology;;   CHOLECYSTECTOMY     CORONARY ANGIOPLASTY     CORONARY STENT PLACEMENT     stenting of the right coronary artery with followup rotational  atherectomy. (3 stents placed)   ENDOSCOPIC MUCOSAL RESECTION  10/13/2022   Procedure: ENDOSCOPIC MUCOSAL RESECTION;  Surgeon: Meridee Score Netty Starring., MD;  Location: [facility];  Service: Gastroenterology;;   ESOPHAGOGASTRODUODENOSCOPY (EGD) WITH PROPOFOL N/A 03/26/2022   Procedure: ESOPHAGOGASTRODUODENOSCOPY (EGD) WITH PROPOFOL;  Surgeon: Iva Boop, MD;  Location: [facility] ENDOSCOPY;  Service: Gastroenterology;  Laterality: N/A;   ESOPHAGOGASTRODUODENOSCOPY (EGD) WITH PROPOFOL N/A 07/11/2022   Procedure: ESOPHAGOGASTRODUODENOSCOPY (EGD) WITH PROPOFOL;  Surgeon: Meridee Score Netty Starring., MD;  Location: [facility];  Service: Gastroenterology;  Laterality: N/A;   ESOPHAGOGASTRODUODENOSCOPY (EGD) WITH PROPOFOL N/A 10/13/2022   Procedure: ESOPHAGOGASTRODUODENOSCOPY (EGD) WITH PROPOFOL;  Surgeon: Meridee Score Netty Starring., MD;  Location: [facility];  Service: Gastroenterology;  Laterality: N/A;   EUS N/A 10/13/2022   Procedure: UPPER ENDOSCOPIC ULTRASOUND (EUS) RADIAL;  Surgeon: Lemar Lofty., MD;  Location: [facility];  Service:  Gastroenterology;  Laterality: N/A;   FINGER SURGERY     right   FOOT SURGERY     right   HEMOSTASIS CLIP PLACEMENT  07/11/2022   Procedure: HEMOSTASIS CLIP PLACEMENT;  Surgeon: Meridee Score Netty Starring., MD;  Location: [facility];  Service: Gastroenterology;;  ovesco    HEMOSTASIS CLIP PLACEMENT  10/13/2022   Procedure: HEMOSTASIS CLIP PLACEMENT;  Surgeon: Lemar Lofty., MD;  Location: [facility];  Service: Gastroenterology;;   HOT HEMOSTASIS N/A 07/11/2022   Procedure: HOT HEMOSTASIS (ARGON PLASMA COAGULATION/BICAP);  Surgeon: Lemar Lofty., MD;  Location: [facility];  Service: Gastroenterology;  Laterality: N/A;   HOT HEMOSTASIS N/A 10/13/2022   Procedure: HOT HEMOSTASIS (ARGON PLASMA COAGULATION/BICAP);  Surgeon: Lemar Lofty., MD;  Location: [facility];  Service: Gastroenterology;  Laterality: N/A;   INGUINAL HERNIA REPAIR  right   IR FLUORO PROCEDURE UNLISTED  02/21/2023   IR GASTROSTOMY TUBE REMOVAL  02/10/2022   IR REPLACE G-TUBE SIMPLE WO FLUORO  03/14/2023   IR REPLC GASTRO/COLONIC TUBE PERCUT W/FLUORO  12/22/2022   IR REPLC GASTRO/COLONIC TUBE PERCUT W/FLUORO  01/26/2023   IR REPLC GASTRO/COLONIC TUBE PERCUT W/FLUORO  03/06/2023   IR REPLC GASTRO/COLONIC TUBE PERCUT W/FLUORO  04/27/2023   IRRIGATION AND DEBRIDEMENT ABSCESS N/A 06/04/2020   Procedure: IRRIGATION AND DEBRIDEMENT SACRAL WOUND;  Surgeon: Diamantina Monks, MD;  Location: [facility] OR;  Service: General;  Laterality: N/A;   LAPAROSCOPY N/A 06/04/2020   Procedure: LAPAROSCOPY ASSISTED COLOSTOMY, LAPAROSCOPY GASTROSTOMY;  Surgeon: Diamantina Monks, MD;  Location: [facility] OR;  Service: General;  Laterality: N/A;   LAPAROSCOPY N/A 05/24/2023   Procedure: LAPAROSCOPY DIAGNOSTIC;  Surgeon: Emelia Loron, MD;  Location: [facility] OR;  Service: General;  Laterality: N/A;   LAPAROTOMY N/A 05/24/2023   Procedure: WEDGED RESECTION OF STOMACH AND GASTROCUTANEOUS FISTULA;  Surgeon: Emelia Loron, MD;  Location: [facility] OR;   Service: General;  Laterality: N/A;   LYSIS OF ADHESION N/A 06/04/2020   Procedure: LYSIS OF ADHESION;  Surgeon: Diamantina Monks, MD;  Location: [facility] OR;  Service: General;  Laterality: N/A;   LYSIS OF ADHESION N/A 05/24/2023   Procedure: LYSIS OF ADHESION;  Surgeon: Emelia Loron, MD;  Location: [facility] OR;  Service: General;  Laterality: N/A;   melanoma removal     neck   ORTHOPEDIC SURGERY     foot right   POLYPECTOMY  10/13/2022   Procedure: POLYPECTOMY;  Surgeon: Lemar Lofty., MD;  Location: [facility];  Service: Gastroenterology;;   rotator cuff surg     Bil   STENT REMOVAL  10/13/2022   Procedure: CLIP REMOVAL;  Surgeon: Lemar Lofty., MD;  Location: [facility];  Service: Gastroenterology;;   SUBMUCOSAL LIFTING INJECTION  10/13/2022   Procedure: SUBMUCOSAL LIFTING INJECTION;  Surgeon: Lemar Lofty., MD;  Location: [facility];  Service: Gastroenterology;;   SUBMUCOSAL TATTOO INJECTION  10/13/2022   Procedure: SUBMUCOSAL TATTOO INJECTION;  Surgeon: Lemar Lofty., MD;  Location: [facility];  Service: Gastroenterology;;     Social History:   reports that he quit smoking about 42 years ago. His smoking use included cigarettes. He started smoking about 72 years ago. He has a 45 pack-year smoking history. He has never used smokeless tobacco. He reports that he does not currently use alcohol after a past usage of about 7.0 - 14.0 standard drinks of alcohol per week. He reports that he does not use drugs.   Family History:  His family history includes COPD in his mother; Heart disease in his father and sister; Liver cancer in his father; Lung cancer in his father and mother; Obesity in his mother. There is no history of Colon cancer, Stomach cancer, Pancreatic cancer, Esophageal cancer, Inflammatory bowel disease, Liver disease, or Rectal cancer.   Allergies Allergies  Allergen Reactions   Cipro [Ciprofloxacin Hcl] Other (See Comments)     Hallucinations Allergy not listed on MAR   Neurontin [Gabapentin] Other (See Comments)    Unknown reaction   Levaquin [Levofloxacin] Rash     Home Medications  Prior to Admission medications   Medication Sig Start Date End Date Taking? Authorizing Provider  acetaminophen (TYLENOL) 500 MG tablet Take 2 tablets (1,000 mg total) by mouth every 6 (six) hours as needed for mild pain (or Fever >/= 101). 05/31/23  Yes Rhetta Mura, MD  atenolol (TENORMIN) 25  MG tablet Take 25 mg by mouth at bedtime.   Yes [provider]  atenolol (TENORMIN) 25 MG tablet Take 12.5 mg by mouth in the morning.   Yes [provider]  atorvastatin (LIPITOR) 40 MG tablet Take 1 tablet (40 mg total) by mouth at bedtime. 05/31/23  Yes Rhetta Mura, MD  baclofen (LIORESAL) 20 MG tablet Take 1 tablet (20 mg total) by mouth 3 (three) times daily. 06/06/23  Yes Amin, Ankit C, MD  benzonatate (TESSALON) 100 MG capsule Take 100 mg by mouth every 8 (eight) hours. 07/13/23 07/20/23 Yes [provider]  cholecalciferol (VITAMIN D3) 25 MCG (1000 UNIT) tablet Take 2,000 Units by mouth daily.   Yes [provider]  DULoxetine (CYMBALTA) 30 MG capsule Take 1 capsule (30 mg total) by mouth at bedtime. 05/31/23  Yes Rhetta Mura, MD  famotidine (PEPCID) 20 MG tablet Take 20 mg by mouth 2 (two) times daily.   Yes [provider]  furosemide (LASIX) 40 MG tablet Take 1 tablet (40 mg total) by mouth daily. 07/18/23  Yes Meredeth Ide, MD  guaiFENesin (MUCINEX) 600 MG 12 hr tablet Take 600 mg by mouth 2 (two) times daily.   Yes [provider]  guaiFENesin-dextromethorphan (ROBITUSSIN DM) 100-10 MG/5ML syrup Take 10 mLs by mouth every 6 (six) hours as needed for cough.   Yes [provider]  insulin aspart (NOVOLOG) 100 UNIT/ML injection Inject 0-15 Units into the skin every 4 (four) hours. Sliding scale  CBG 70 - 120: 0 units: CBG 121 - 150: 2 units; CBG 151 -  200: 3 units; CBG 201 - 250: 5 units; CBG 251 - 300: 8 units;CBG 301 - 350: 11 units; CBG 351 - 400: 15 units; CBG > 400 : 15 units and notify MD Patient taking differently: Inject 0-15 Units into the skin See admin instructions. Inject 0-15 units before meals per sliding scale: 70-120 : 0 units 121-150 : 2 units 151-200 : 3 units 201-250 : 5 units 251-300 : 8 units 301-350 : 11 units 351-400 : 15 units > 400 : 15 units, notify MD 06/10/20  Yes Rai, Ripudeep K, MD  insulin glargine-yfgn (SEMGLEE, YFGN,) 100 UNIT/ML Pen Inject 35 Units into the skin 2 (two) times daily.   Yes [provider]  lamoTRIgine (LAMICTAL) 25 MG tablet Take 2 tablets (50 mg total) by mouth 2 (two) times daily. For 5 days. Started 02/18/23. 07/06/23  Yes Glade Lloyd, MD  LORazepam (ATIVAN) 0.5 MG tablet Take 1 tablet (0.5 mg total) by mouth daily as needed (panic attacks). 07/06/23  Yes Glade Lloyd, MD  losartan (COZAAR) 50 MG tablet Take 75 mg by mouth daily.   Yes [provider]  Menthol, Topical Analgesic, (BIOFREEZE) 4 % GEL Apply 1 application  topically 2 (two) times daily as needed (pain to back (upper and lower),arms and shoulders).   Yes [provider]  metFORMIN (GLUCOPHAGE) 1000 MG tablet Take 1,000 mg by mouth in the morning and at bedtime.   Yes [provider]  nystatin powder Apply 1 Application topically daily. Apply to peri-wound bed, sacrum and right ischium daily with routine dressing.   Yes [provider]  omeprazole (PRILOSEC) 40 MG capsule Take 1 capsule (40 mg total) by mouth 2 (two) times daily. 30 minutes before breakfast/dinner 10/13/22  Yes Mansouraty, Netty Starring., MD  ondansetron (ZOFRAN-ODT) 4 MG disintegrating tablet Take 4 mg by mouth every 8 (eight) hours as needed for nausea or vomiting.  Yes [provider]  oxyCODONE (OXY IR/ROXICODONE) 5 MG immediate release tablet Take 1 tablet (5 mg total) by mouth 2 (two) times daily as  needed for breakthrough pain. 07/17/23  Yes Lama, Sarina Ill, MD  piperacillin-tazobactam (ZOSYN) IVPB Inject 3.375 g into the vein every 6 (six) hours. Indication: diabetic foot infection/osteomyelitis  First Dose: Yes Last Day of Therapy: 08/15/2023 Labs - Once weekly:  CBC/D and BMP, Labs - Once weekly: ESR and CRP 07/06/23 08/15/23 Yes Alekh, Kshitiz, MD  Polyethyl Glycol-Propyl Glycol (GENTEAL TEARS SEVERE DAY/NIGHT) 0.4-0.3 % GEL ophthalmic gel Place 1 Application into both eyes 4 (four) times daily as needed (dry eyes).   Yes [provider]  polyethylene glycol (MIRALAX / GLYCOLAX) 17 g packet Take 17 g by mouth daily as needed for mild constipation. 07/06/23  Yes Glade Lloyd, MD  potassium chloride SA (KLOR-CON M) 20 MEQ tablet Take 1 tablet (20 mEq total) by mouth daily. Take along with Lasix 07/17/23  Yes Lama, Sarina Ill, MD  riTUXimab (RITUXAN IV) Inject 10 mg into the vein once. Between the 12th-13th of June, December.   Yes [provider]  senna-docusate (SENOKOT-S) 8.6-50 MG tablet Take 1 tablet by mouth 2 (two) times daily.   Yes [provider]  spironolactone (ALDACTONE) 25 MG tablet Take 25 mg by mouth daily.   Yes [provider]  traZODone (DESYREL) 100 MG tablet Take 100 mg by mouth at bedtime.   Yes [provider]  vitamin C (ASCORBIC ACID) 500 MG tablet Take 500 mg by mouth daily.   Yes [provider]  zinc sulfate 220 (50 Zn) MG capsule Take 1 capsule (220 mg total) by mouth daily. 07/07/23  Yes Glade Lloyd, MD         Steffanie Dunn, DO 07/24/23 5:33 PM [facility]  For contact information, see Amion. If no response to pager, please call PCCM consult pager. After hours, 7PM- 7AM, please call Elink.   Marland Kitchen

## 2023-07-24 NOTE — Plan of Care (Signed)
  Problem: Education: Goal: Knowledge of General Education information will improve Description: Including pain rating scale, medication(s)/side effects and non-pharmacologic comfort measures Outcome: Progressing   Problem: Health Behavior/Discharge Planning: Goal: Ability to manage health-related needs will improve Outcome: Progressing   Problem: Clinical Measurements: Goal: Ability to maintain clinical measurements within normal limits will improve Outcome: Progressing Goal: Will remain free from infection Outcome: Progressing Goal: Diagnostic test results will improve Outcome: Progressing Goal: Respiratory complications will improve Outcome: Progressing Goal: Cardiovascular complication will be avoided Outcome: Progressing   Problem: Activity: Goal: Risk for activity intolerance will decrease Outcome: Progressing   Problem: Nutrition: Goal: Adequate nutrition will be maintained Outcome: Progressing   Problem: Coping: Goal: Level of anxiety will decrease Outcome: Progressing   Problem: Pain Management: Goal: General experience of comfort will improve Outcome: Progressing   Problem: Elimination: Goal: Will not experience complications related to urinary retention Outcome: Progressing   Problem: Safety: Goal: Ability to remain free from injury will improve Outcome: Progressing

## 2023-07-24 NOTE — Plan of Care (Signed)
  Problem: Education: Goal: Knowledge of General Education information will improve Description: Including pain rating scale, medication(s)/side effects and non-pharmacologic comfort measures Outcome: Progressing   Problem: Health Behavior/Discharge Planning: Goal: Ability to manage health-related needs will improve Outcome: Progressing   Problem: Clinical Measurements: Goal: Ability to maintain clinical measurements within normal limits will improve Outcome: Progressing Goal: Will remain free from infection Outcome: Progressing Goal: Diagnostic test results will improve Outcome: Progressing Goal: Respiratory complications will improve Outcome: Progressing Goal: Cardiovascular complication will be avoided Outcome: Progressing   Problem: Activity: Goal: Risk for activity intolerance will decrease Outcome: Progressing   Problem: Nutrition: Goal: Adequate nutrition will be maintained Outcome: Progressing   Problem: Coping: Goal: Level of anxiety will decrease Outcome: Progressing   Problem: Elimination: Goal: Will not experience complications related to urinary retention Outcome: Progressing   Problem: Pain Management: Goal: General experience of comfort will improve Outcome: Progressing   Problem: Safety: Goal: Ability to remain free from injury will improve Outcome: Progressing   Problem: Skin Integrity: Goal: Risk for impaired skin integrity will decrease Outcome: Progressing   Problem: Education: Goal: Ability to describe self-care measures that may prevent or decrease complications (Diabetes Survival Skills Education) will improve Outcome: Progressing Goal: Individualized Educational Video(s) Outcome: Progressing   Problem: Coping: Goal: Ability to adjust to condition or change in health will improve Outcome: Progressing   Problem: Fluid Volume: Goal: Ability to maintain a balanced intake and output will improve Outcome: Progressing   Problem: Health  Behavior/Discharge Planning: Goal: Ability to identify and utilize available resources and services will improve Outcome: Progressing Goal: Ability to manage health-related needs will improve Outcome: Progressing   Problem: Metabolic: Goal: Ability to maintain appropriate glucose levels will improve Outcome: Progressing   Problem: Nutritional: Goal: Maintenance of adequate nutrition will improve Outcome: Progressing Goal: Progress toward achieving an optimal weight will improve Outcome: Progressing   Problem: Skin Integrity: Goal: Risk for impaired skin integrity will decrease Outcome: Progressing   Problem: Tissue Perfusion: Goal: Adequacy of tissue perfusion will improve Outcome: Progressing   Problem: Safety: Goal: Non-violent Restraint(s) Outcome: Progressing

## 2023-07-24 NOTE — Care Management Important Message (Signed)
Important Message  Patient Details  Name: Brent [name]. MRN: [id number] Date of Birth: [birth year]   Important Message Given:  Yes - Medicare IM     Sherilyn Banker 07/24/2023, 10:00 AM

## 2023-07-24 NOTE — Progress Notes (Signed)
PROGRESS NOTE    Brent [name].  ACZ:[id number] DOB: November 17, 1945 DOA: 07/19/2023 PCP: Brent Dupes, MD  Chief Complaint  Patient presents with   Weakness    Brief Narrative:   Brent [name]. is Brent [name] 77 y.o. male with medical history significant of quadriparesis and quadriplegia due to transverse myelitis, GI fistula s/p post wedge resection 9/24, neurogenic bladder s/p suprapubic catheter, stage IV decubitus ulcer, insulin-dependent DM type II, asthma who presents with complaints of malaise.  History is obtained with the assistance of the patient's wife who is present at bedside.  Apparently the patient called out after feeling poorly and was noted to have heart rates as low as 46 stating that he felt like he was going to die for which the facility called EMS.  He just recently been hospitalized 10/11-10/17 for evaluation for sacral ulcer and right foot wound which revealed right foot osteomyelitis.  Patient was seen by orthopedics and underwent Brent [name] right fifth ray amputation.  ID have been consulted and recommended 6 weeks of IV Zosyn in the noted to be 08/14/23 for which PICC line was placed.  After patient was rehospitalized from 10/25-10/28 with shortness of breath secondary to acute on chronic heart failure with preserved EF.  Patient was treated with IV Lasix and discharged back to the SNF on 10/28.  Since being at the facility patient reported that he still had Brent [name] cough.  Patient wife notes that he has had intermittent confusion, but the Ativan does help.     Upon admission into the emergency department patient was noted to be hypothermic with pulse 45-69, blood pressures 74/51-162/51, and O2 saturation currently maintained on 2 L nasal cannula oxygen.  WBC elevated at 11.8, hemoglobin 9.4, glucose 142, BNP 136.4, high-sensitivity troponin negative x 2.  Chest x-ray noted stable right sided PICC.  CT angiogram of the head and cervical spine did not note any acute abnormality.  CT  angiogram of the chest abdomen pelvis noted moderate-sized left greater than right pleural effusion with basilar atelectasis, and consolidative opacities in the posterior left lung base.  Patient was given Lasix 40 mg IV x 1 dose and alteplase 2 mg IV.  Assessment & Plan:   Principal Problem:   Pleural effusion Active Problems:   (HFpEF) heart failure with preserved ejection fraction (HCC)   History of osteomyelitis   Leukocytosis   Sinus bradycardia   Hyperlipidemia   DEPRESSION, SITUATIONAL, ACUTE   Essential hypertension   Paraplegia (HCC)   Controlled type 2 diabetes mellitus with hyperglycemia, with long-term current use of insulin (HCC)   Transverse myelitis (HCC)   Stage IV pressure ulcer of sacral region [facility])   External gastrointestinal fistula site infection   Normocytic anemia   GERD (gastroesophageal reflux disease)  Delirium  Pulling lines and tubes, required mitts overnight Wife notes intermittent confusion for Brent [name] few months No known hx dementia Head CT without acute intracranial abnormality Ammonia pending, b12 wnl, folate wnl.  VBG without hypercarbia. Suspect related to acute hospitalization - will trial seroquel (discussed with wife, risks) He'll need neurology follow up outpatient given his cognitive decline   Pleural effusion Heart failure with preserved ejection fraction Acute on chronic.  CT scan of the chest noted moderate-sized left greater than right pleural effusion with basilar atelectasis, and consolidative opacities in the posterior left lung base.  BNP was elevated at 136.4.  Procal <0.1 Last echocardiogram 07/15/23 noted EF to be 60 to 65% with normal diastolic parameters.  Consider cards c/s (will hold off on repeat echo given one done recently) Continue lasix BID Repeat CXR 11/4 - layering L effusion, cardiomegaly with mild edema Appreciate pulm assistance - recommending against thora, rec continued med management with diuresis Strict I/O,  daily weights  Bradycardia Transient hypotension Heart rates noted to be in the 40-50s upon admission with initial blood pressure reported to be as low as 74/51.   Hold antihypertensives and AV nodal blockers (atenolol, losratan, spironolactone on hold)  SVT Noted on tele, will follow on tele Hold AV nodal blocker due to above  Chest Discomfort Shortness of Breath Lightheadedness Resolved. Due to above? EKG without concerning findings.  Trop negative.  CXR 10/31 with improved aeration of both lungs, mild pulm vascular congestion, L pleural effusion .  Treating volume overload as above Additional w/u if symptoms persistent/not improving  Leukocytosis Improved, follow    History of osteomyelitis of the right fifth metatarsal Septic arthritis of fifth MTP joint Patient s/p fifth ray amputation on 10/13 by orthopedics after MRI showed fifth MTP joint septic arthritis and osteomyelitis of the fifth metatarsal and fifth proximal phalanx. OR cultures growing Providencia/Proteus/E faecalis/Bacteroides.  Scheduled to be on 6 weeks of IV Zosyn per ID until 08/14/2023.  PICC replaced 11/3.  -Continue Zosyn   Diabetes mellitus type 2, with long-term use of insulin Last hemoglobin A1c was 7.5 on 06/06/2023.  Home medication regimen includes Semglee -Hold metformin -Pharmacy substitution of Semglee at 24 units twice daily -SSI    Essential hypertension -hold losartan and spironolactone (BP with wide fluctuations) -follow with diuresis   Anemia of chronic disease Trend Labs suggestive of iron def  Stage IV decubitus ulcer Ischial decubitus ulcer Patient has chronic ulcers for which previous CT imaging imaging from 10/11 showed stable ulcer with no fluid collection . -Low-air-loss mattress replacement -appreciate wound care recs Clean plantar foot intact sutures with NS, apply Xeroform to incision site daily, cover with dry gauze or silicone foam.  Clean Sacral and Ischial wounds with  Vashe wound cleanser [name] [phone]), apply Medihoney to wound beds daily, cover with dry gauze.  Apply Gerhardt's Butt Cream to surrounding skin of sacrum/coccyx/buttocks 3 times daily and prn soiling. May cover with ABD pad or silicone foams whichever is preferred.    Paraplegia secondary to transverse myelitis Neurogenic bladder s/p suprapubic catheter -Assistance with feeding -Continue baclofen -seems like he's overdue on suprapubic catheter exchange, will try to do this here before he discharges   History of gastrocolic fistula S/p colostomy -Continue colostomy care   Hyperlipidemia -Continue atorvastatin   Depression -Continue Cymbalta   GERD -Continue pharmacy substitution of Protonix      DVT prophylaxis: lovenox Code Status: full Family Communication: wife 11/3 Disposition:   Status is: Inpatient Remains inpatient appropriate because: continued inpatient care   Consultants:  Pulmonary   Procedures:  none  Antimicrobials:  Anti-infectives (From admission, onward)    Start     Dose/Rate Route Frequency Ordered Stop   07/22/23 0000  piperacillin-tazobactam (ZOSYN) IVPB 3.375 g        3.375 g 12.5 mL/hr over 240 Minutes Intravenous Every 8 hours 07/21/23 2126 08/15/23 2359   07/19/23 1800  piperacillin-tazobactam (ZOSYN) IVPB 3.375 g  Status:  Discontinued        3.375 g 100 mL/hr over 30 Minutes Intravenous Every 6 hours 07/19/23 1659 07/21/23 2126   07/19/23 1745  piperacillin-tazobactam (ZOSYN) IVPB  Status:  Discontinued       Note to Pharmacy:  Indication: diabetic foot infection/osteomyelitis  First Dose: Yes Last Day of Therapy: 08/15/2023 Labs - Once weekly:  CBC/D and BMP, Labs - Once weekly: ESR and CRP     3.375 g Intravenous Every 6 hours 07/19/23 1652 07/19/23 1657       Subjective: No complaints  Objective: Vitals:   07/24/23 1029 07/24/23 1311 07/24/23 1413 07/24/23 1614  BP: (!) 131/56 (!) 104/50  (!) 175/70  Pulse: 76 79  92   Resp: 16 16  16   Temp: 97.8 F (36.6 C) 98.4 F (36.9 C)  99.2 F (37.3 C)  TempSrc: Oral Oral  Oral  SpO2: 97% 95%  96%  Weight:   90.7 kg   Height:        Intake/Output Summary (Last 24 hours) at 07/24/2023 1740 Last data filed at 07/24/2023 1500 Gross per 24 hour  Intake 884.58 ml  Output 625 ml  Net 259.58 ml   Filed Weights   07/24/23 1413  Weight: 90.7 kg    Examination:  General: No acute distress. Cardiovascular: RRR Lungs: unlabored Neurological: Alert, but disoriented, lower extremity weakness Skin: stage 4 decub  Extremities: No clubbing or cyanosis. No edema.   Data Reviewed: I have personally reviewed following labs and imaging studies  CBC: Recent Labs  Lab 07/19/23 0920 07/19/23 0933 07/20/23 0731 07/21/23 0624  WBC 11.8*  --  9.3 9.6  NEUTROABS 9.5*  --   --   --   HGB 9.4* 10.2*  9.9* 9.1* 10.2*  HCT 32.0* 30.0*  29.0* 30.8* 33.6*  MCV 84.9  --  83.2 82.6  PLT 375  --  377 396    Basic Metabolic Panel: Recent Labs  Lab 07/19/23 0920 07/19/23 0933 07/20/23 0731 07/21/23 0624 07/22/23 1357  NA 140 140  140 136 139 136  K 3.6 3.6  3.6 3.4* 4.0 3.8  CL 102 100 95* 103 100  CO2 26  --  30 24 24   GLUCOSE 142* 141* 161* 159* 176*  BUN 9 8 10 8 12   CREATININE 0.60* 0.50* 0.84 0.72 0.78  CALCIUM 8.6*  --  8.6* 8.5* 8.9    GFR: Estimated Creatinine Clearance: 86 mL/min (by C-G formula based on SCr of 0.78 mg/dL).  Liver Function Tests: Recent Labs  Lab 07/19/23 0920 07/22/23 1357  AST 19 22  ALT 16 20  ALKPHOS 67 83  BILITOT 0.4 0.5  PROT 5.5* 6.3*  ALBUMIN 2.3* 2.6*    CBG: Recent Labs  Lab 07/23/23 1625 07/23/23 2111 07/24/23 0819 07/24/23 1309 07/24/23 1612  GLUCAP 109* 147* 103* 154* 154*     Recent Results (from the past 240 hour(s))  Blood Culture (Routine X 2)     Status: None   Collection Time: 07/19/23  8:55 AM   Specimen: BLOOD  Result Value Ref Range Status   Specimen Description BLOOD SITE NOT  SPECIFIED  Final   Special Requests   Final    BOTTLES DRAWN AEROBIC ONLY Blood Culture adequate volume   Culture   Final    NO GROWTH 5 DAYS Performed at [facility], [address] [place], [place] [address]    Report Status 07/24/2023 FINAL  Final  Blood Culture (Routine X 2)     Status: None   Collection Time: 07/19/23  9:00 AM   Specimen: BLOOD  Result Value Ref Range Status   Specimen Description BLOOD SITE NOT SPECIFIED  Final   Special Requests   Final  BOTTLES DRAWN AEROBIC ONLY Blood Culture adequate volume   Culture   Final    NO GROWTH 5 DAYS Performed at [facility], [address], [place], [place] [address]    Report Status 07/24/2023 FINAL  Final  Resp panel by RT-PCR (RSV, Flu Alexx Giambra&B, Covid) Anterior Nasal Swab     Status: None   Collection Time: 07/19/23  9:45 AM   Specimen: Anterior Nasal Swab  Result Value Ref Range Status   SARS Coronavirus 2 by RT PCR NEGATIVE NEGATIVE Final   Influenza Zlata Alcaide by PCR NEGATIVE NEGATIVE Final   Influenza B by PCR NEGATIVE NEGATIVE Final    Comment: (NOTE) The Xpert Xpress SARS-CoV-2/FLU/RSV plus assay is intended as an aid in the diagnosis of influenza from Nasopharyngeal swab specimens and should not be used as Lylah Lantis sole basis for treatment. Nasal washings and aspirates are unacceptable for Xpert Xpress SARS-CoV-2/FLU/RSV testing.  Fact Sheet for Patients: BloggerCourse.com  Fact Sheet for Healthcare Providers: SeriousBroker.it  This test is not yet approved or cleared by the [place] FDA and has been authorized for detection and/or diagnosis of SARS-CoV-2 by FDA under an Emergency Use Authorization (EUA). This EUA will remain in effect (meaning this test can be used) for the duration of the COVID-19 declaration under Section 564(b)(1) of the Act, 21 U.S.C. section 360bbb-3(b)(1), unless the authorization is terminated or revoked.     Resp Syncytial Virus  by PCR NEGATIVE NEGATIVE Final    Comment: (NOTE) Fact Sheet for Patients: BloggerCourse.com  Fact Sheet for Healthcare Providers: SeriousBroker.it  This test is not yet approved or cleared by the [place] FDA and has been authorized for detection and/or diagnosis of SARS-CoV-2 by FDA under an Emergency Use Authorization (EUA). This EUA will remain in effect (meaning this test can be used) for the duration of the COVID-19 declaration under Section 564(b)(1) of the Act, 21 U.S.C. section 360bbb-3(b)(1), unless the authorization is terminated or revoked.  Performed at [facility], [address], [place], [place] [address]          Radiology Studies: DG CHEST PORT 1 VIEW  Result Date: 07/24/2023 CLINICAL DATA:  142230 Pleural effusion 242230 EXAM: PORTABLE CHEST 1 VIEW COMPARISON:  CXR 07/22/23 FINDINGS: Right arm PICC with the tip at the cavoatrial junction. Layering left-sided pleural effusion, slightly increased from prior exam. Cardiomegaly. There are prominent bilateral interstitial opacities, favored to represent mild pulmonary edema. No radiographically apparent displaced rib fractures. Visualized upper abdomen is notable for surgical clips in the right upper quadrant. IMPRESSION: 1. Layering left-sided pleural effusion, slightly increased from prior exam. 2. Cardiomegaly with mild pulmonary edema. Electronically Signed   By: Lorenza Cambridge M.D.   On: 07/24/2023 12:18        Scheduled Meds:  ascorbic acid  500 mg Oral Daily   atenolol  12.5 mg Oral q AM   atorvastatin  40 mg Oral QHS   baclofen  20 mg Oral TID   Chlorhexidine Gluconate Cloth  6 each Topical Daily   DULoxetine  30 mg Oral QHS   enoxaparin (LOVENOX) injection  40 mg Subcutaneous Q24H   ferrous sulfate  325 mg Oral QODAY   furosemide  40 mg Intravenous BID   Gerhardt's butt cream   Topical TID   guaiFENesin  600 mg Oral BID   insulin aspart   0-15 Units Subcutaneous TID WC   insulin glargine-yfgn  24 Units Subcutaneous BID   lamoTRIgine  50 mg Oral  BID   leptospermum manuka honey  1 Application Topical Daily   nystatin  1 Application Topical Daily   pantoprazole  40 mg Oral Daily   potassium chloride SA  20 mEq Oral Daily   QUEtiapine  25 mg Oral QHS   senna-docusate  1 tablet Oral BID   sodium chloride flush  10-40 mL Intracatheter Q12H   traZODone  100 mg Oral QHS   zinc sulfate  220 mg Oral Daily   Continuous Infusions:  piperacillin-tazobactam (ZOSYN)  IV 3.375 g (07/24/23 1650)     LOS: 5 days    Time spent: oer 30 min    Lacretia Nicks, MD [facility]   To contact the attending provider between 7A-7P or the covering provider during after hours 7P-7A, please log into the web site [email or web address] and access using universal Caroline password for that web site. If you do not have the password, please call the hospital operator.  07/24/2023, 5:40 PM

## 2023-07-25 DIAGNOSIS — Z7189 Other specified counseling: Secondary | ICD-10-CM | POA: Diagnosis not present

## 2023-07-25 DIAGNOSIS — Z515 Encounter for palliative care: Secondary | ICD-10-CM | POA: Diagnosis not present

## 2023-07-25 DIAGNOSIS — J90 Pleural effusion, not elsewhere classified: Secondary | ICD-10-CM | POA: Diagnosis not present

## 2023-07-25 LAB — BASIC METABOLIC PANEL
Anion gap: 10 (ref 5–15)
BUN: 19 mg/dL (ref 8–23)
CO2: 28 mmol/L (ref 22–32)
Calcium: 8.5 mg/dL — ABNORMAL LOW (ref 8.9–10.3)
Chloride: 95 mmol/L — ABNORMAL LOW (ref 98–111)
Creatinine, Ser: 0.93 mg/dL (ref 0.61–1.24)
GFR, Estimated: >60 mL/min (ref >60)
Glucose, Bld: 281 mg/dL — ABNORMAL HIGH (ref 70–99)
Potassium: 3.3 mmol/L — ABNORMAL LOW (ref 3.5–5.1)
Sodium: 133 mmol/L — ABNORMAL LOW (ref 135–145)

## 2023-07-25 LAB — GLUCOSE, CAPILLARY
Glucose-Capillary: 143 mg/dL — ABNORMAL HIGH (ref 70–99)
Glucose-Capillary: 246 mg/dL — ABNORMAL HIGH (ref 70–99)
Glucose-Capillary: 208 mg/dL — ABNORMAL HIGH (ref 70–99)
Glucose-Capillary: 190 mg/dL — ABNORMAL HIGH (ref 70–99)
Glucose-Capillary: 204 mg/dL — ABNORMAL HIGH (ref 70–99)

## 2023-07-25 MED ORDER — POTASSIUM CHLORIDE 10 MEQ/100ML IV SOLN
10.0000 meq | INTRAVENOUS | Status: AC
  Administered 2023-07-26 (×4): 10 meq via INTRAVENOUS
  Filled 2023-07-25 (×4): qty 100

## 2023-07-25 MED ORDER — FUROSEMIDE 10 MG/ML IJ SOLN: 60.0000 mg | Freq: Two times a day (BID) | INTRAMUSCULAR | Status: DC

## 2023-07-25 MED ORDER — POTASSIUM CHLORIDE CRYS ER 20 MEQ PO TBCR
40.0000 meq | EXTENDED_RELEASE_TABLET | ORAL | Status: DC
  Filled 2023-07-25: qty 2

## 2023-07-25 MED ORDER — LIDOCAINE 5 % EX PTCH
1.0000 | MEDICATED_PATCH | CUTANEOUS | Status: DC
  Administered 2023-07-25: 1 via TRANSDERMAL
  Filled 2023-07-25: qty 1

## 2023-07-25 NOTE — Progress Notes (Signed)
PROGRESS NOTE    Brent [name].  QMV:[id number] DOB: 04-04-1946 DOA: 07/19/2023 PCP: [name], MD  Chief Complaint  Patient presents with   Weakness    Brief Narrative:   Brent [name]. is Brent [name] 77 y.o. male with medical history significant of quadriparesis and quadriplegia due to transverse myelitis, GI fistula s/p post wedge resection 9/24, neurogenic bladder s/p suprapubic catheter, stage IV decubitus ulcer, insulin-dependent DM type II, asthma who presents with complaints of malaise.  Apparently the patient called out after feeling poorly and was noted to have heart rates as low as 46 stating that he felt like he was going to die for which the facility called EMS.  He just recently been hospitalized 10/11-10/17 for evaluation for sacral ulcer and right foot wound which revealed right foot osteomyelitis.  Patient was seen by orthopedics and underwent Grazia Taffe right fifth ray amputation.  ID have been consulted and recommended 6 weeks of IV Zosyn in the noted to be 08/14/23 for which PICC line was placed.  After patient was rehospitalized from 10/25-10/28 with shortness of breath secondary to acute on chronic heart failure with preserved EF.  Patient was treated with IV Lasix and discharged back to the SNF on 10/28.  Since being at the facility patient reported that he still had Brent [name] cough.  Patient wife notes that he has had intermittent confusion, but the Ativan does help.   He's been diuresed.  CXR continues to show the L sided effusion.  Pulmonology was consulted and recommended continued diuresis.  Lasix has been increased to 60 mg BID to start 11/6.  His wife notes poor PO intake and generalized malaise.  Palliative care has been consulted.     Assessment & Plan:   Principal Problem:   Pleural effusion Active Problems:   (HFpEF) heart failure with preserved ejection fraction (HCC)   History of osteomyelitis   Leukocytosis   Sinus bradycardia   Hyperlipidemia   DEPRESSION,  SITUATIONAL, ACUTE   Essential hypertension   Paraplegia (HCC)   Controlled type 2 diabetes mellitus with hyperglycemia, with long-term current use of insulin (HCC)   Transverse myelitis (HCC)   Stage IV pressure ulcer of sacral region [facility])   External gastrointestinal fistula site infection   Normocytic anemia   GERD (gastroesophageal reflux disease)  Goals of Care Palliative c/s for discussion  Delirium  Pulling lines and tubes, required mitts overnight Wife notes intermittent confusion for Brent [name] few months No known hx dementia Head CT without acute intracranial abnormality Ammonia pending, b12 wnl, folate wnl.  VBG without hypercarbia. Suspect related to acute hospitalization -  improved with seroquel, now more sedated, will hold further antipsychotic, he's on trazodone, will continue that. He'll need neurology follow up outpatient given his cognitive decline   Pleural effusion Heart failure with preserved ejection fraction Acute on chronic.  CT scan of the chest noted moderate-sized left greater than right pleural effusion with basilar atelectasis, and consolidative opacities in the posterior left lung base.  BNP was elevated at 136.4.  Procal <0.1 Last echocardiogram 07/15/23 noted EF to be 60 to 65% with normal diastolic parameters.   Consider cards c/s (will hold off on repeat echo given one done recently) Continue lasix BID (will increase to 60 mg BID on 11/6 given lack of improvement in effusion to this point) Repeat CXR 11/4 - layering L effusion, cardiomegaly with mild edema Appreciate pulm assistance - recommending against thora, rec continued med management with diuresis Strict I/O (  net negative ~ 9 L), daily weights (weight down ~4 kg from admission)  Bradycardia Transient hypotension Heart rates noted to be in the 40-50s upon admission with initial blood pressure reported to be as low as 74/51.   Hold antihypertensives and AV nodal blockers (atenolol - atenolol now  restarted due to SVT as below) losartan, spironolactone on hold  SVT Noted on tele 11/2, will follow on tele Atenolol restarted at lower dose 11/2  Poor PO intake Noted by his wife, will c/s RD and monitor PO intake Per discussion with RN's, he ate welll this AM  Chest Discomfort Shortness of Breath Lightheadedness Resolved. Due to above? EKG without concerning findings.  Trop negative.  CXR 10/31 with improved aeration of both lungs, mild pulm vascular congestion, L pleural effusion .  Treating volume overload as above Additional w/u if symptoms persistent/not improving  Leukocytosis Improved, follow    History of osteomyelitis of the right fifth metatarsal Septic arthritis of fifth MTP joint Patient s/p fifth ray amputation on 10/13 by orthopedics after MRI showed fifth MTP joint septic arthritis and osteomyelitis of the fifth metatarsal and fifth proximal phalanx. OR cultures growing Providencia/Proteus/E faecalis/Bacteroides.  Scheduled to be on 6 weeks of IV Zosyn per ID until 08/14/2023.  PICC replaced 11/3.  -Continue Zosyn   Diabetes mellitus type 2, with long-term use of insulin Last hemoglobin A1c was 7.5 on 06/06/2023.  Home medication regimen includes Semglee -Hold metformin -Pharmacy substitution of Semglee at 24 units twice daily -SSI    Essential hypertension -hold losartan and spironolactone (BP generally ok today) -follow with diuresis   Anemia of chronic disease Trend Labs suggestive of iron def  Stage IV decubitus ulcer Ischial decubitus ulcer Patient has chronic ulcers for which previous CT imaging imaging from 10/11 showed stable ulcer with no fluid collection . -Low-air-loss mattress replacement -appreciate wound care recs Clean plantar foot intact sutures with NS, apply Xeroform to incision site daily, cover with dry gauze or silicone foam.  Clean Sacral and Ischial wounds with Vashe wound cleanser [name] [phone]), apply Medihoney to wound beds  daily, cover with dry gauze.  Apply Gerhardt's Butt Cream to surrounding skin of sacrum/coccyx/buttocks 3 times daily and prn soiling. May cover with ABD pad or silicone foams whichever is preferred.    Paraplegia secondary to transverse myelitis Neurogenic bladder s/p suprapubic catheter -Assistance with feeding -Continue baclofen -suprapubic catheter exchanged 11/5 (overdue per discussion with wife)   History of gastrocolic fistula S/p colostomy -Continue colostomy care   Hyperlipidemia -Continue atorvastatin   Depression -Continue Cymbalta   GERD -Continue pharmacy substitution of Protonix      DVT prophylaxis: lovenox Code Status: full Family Communication: wife 11/3 Disposition:   Status is: Inpatient Remains inpatient appropriate because: continued inpatient care   Consultants:  Pulmonary   Procedures:  none  Antimicrobials:  Anti-infectives (From admission, onward)    Start     Dose/Rate Route Frequency Ordered Stop   07/22/23 0000  piperacillin-tazobactam (ZOSYN) IVPB 3.375 g        3.375 g 12.5 mL/hr over 240 Minutes Intravenous Every 8 hours 07/21/23 2126 08/15/23 2359   07/19/23 1800  piperacillin-tazobactam (ZOSYN) IVPB 3.375 g  Status:  Discontinued        3.375 g 100 mL/hr over 30 Minutes Intravenous Every 6 hours 07/19/23 1659 07/21/23 2126   07/19/23 1745  piperacillin-tazobactam (ZOSYN) IVPB  Status:  Discontinued       Note to Pharmacy: Indication: diabetic foot infection/osteomyelitis  First  Dose: Yes Last Day of Therapy: 08/15/2023 Labs - Once weekly:  CBC/D and BMP, Labs - Once weekly: ESR and CRP     3.375 g Intravenous Every 6 hours 07/19/23 1652 07/19/23 1657       Subjective: No new complaints His wife is concerned about his PO intake and doesn't feel he's to his baseline  Objective: Vitals:   07/25/23 0633 07/25/23 0748 07/25/23 1226 07/25/23 1619  BP: 108/62 108/66 136/66 133/88  Pulse: 70 71 64 72  Resp:  12 16 16   Temp:       TempSrc:      SpO2:  100% 98% 99%  Weight:      Height:        Intake/Output Summary (Last 24 hours) at 07/25/2023 1906 Last data filed at 07/25/2023 1500 Gross per 24 hour  Intake 640 ml  Output 1150 ml  Net -510 ml   Filed Weights   07/24/23 1413 07/25/23 0349  Weight: 90.7 kg 90.6 kg    Examination:  General: No acute distress. Cardiovascular: RRR Lungs: unlabored Neurological: drowsy, lower extremity weakness Decub not examined today Extremities: No clubbing or cyanosis. No edema.   Data Reviewed: I have personally reviewed following labs and imaging studies  CBC: Recent Labs  Lab 07/19/23 0920 07/19/23 0933 07/20/23 0731 07/21/23 0624  WBC 11.8*  --  9.3 9.6  NEUTROABS 9.5*  --   --   --   HGB 9.4* 10.2*  9.9* 9.1* 10.2*  HCT 32.0* 30.0*  29.0* 30.8* 33.6*  MCV 84.9  --  83.2 82.6  PLT 375  --  377 396    Basic Metabolic Panel: Recent Labs  Lab 07/19/23 0920 07/19/23 0933 07/20/23 0731 07/21/23 0624 07/22/23 1357 07/25/23 1130  NA 140 140  140 136 139 136 133*  K 3.6 3.6  3.6 3.4* 4.0 3.8 3.3*  CL 102 100 95* 103 100 95*  CO2 26  --  30 24 24 28   GLUCOSE 142* 141* 161* 159* 176* 281*  BUN 9 8 10 8 12 19   CREATININE 0.60* 0.50* 0.84 0.72 0.78 0.93  CALCIUM 8.6*  --  8.6* 8.5* 8.9 8.5*    GFR: Estimated Creatinine Clearance: 74 mL/min (by C-G formula based on SCr of 0.93 mg/dL).  Liver Function Tests: Recent Labs  Lab 07/19/23 0920 07/22/23 1357  AST 19 22  ALT 16 20  ALKPHOS 67 83  BILITOT 0.4 0.5  PROT 5.5* 6.3*  ALBUMIN 2.3* 2.6*    CBG: Recent Labs  Lab 07/24/23 2055 07/25/23 0744 07/25/23 1224 07/25/23 1618 07/25/23 1740  GLUCAP 199* 143* 246* 208* 190*     Recent Results (from the past 240 hour(s))  Blood Culture (Routine X 2)     Status: None   Collection Time: 07/19/23  8:55 AM   Specimen: BLOOD  Result Value Ref Range Status   Specimen Description BLOOD SITE NOT SPECIFIED  Final   Special Requests    Final    BOTTLES DRAWN AEROBIC ONLY Blood Culture adequate volume   Culture   Final    NO GROWTH 5 DAYS Performed at [facility], [address], [place], [place] [address]    Report Status 07/24/2023 FINAL  Final  Blood Culture (Routine X 2)     Status: None   Collection Time: 07/19/23  9:00 AM   Specimen: BLOOD  Result Value Ref Range Status   Specimen Description BLOOD SITE NOT SPECIFIED  Final  Special Requests   Final    BOTTLES DRAWN AEROBIC ONLY Blood Culture adequate volume   Culture   Final    NO GROWTH 5 DAYS Performed at [facility], [address], [place], [place] [address]    Report Status 07/24/2023 FINAL  Final  Resp panel by RT-PCR (RSV, Flu Graycen Sadlon&B, Covid) Anterior Nasal Swab     Status: None   Collection Time: 07/19/23  9:45 AM   Specimen: Anterior Nasal Swab  Result Value Ref Range Status   SARS Coronavirus 2 by RT PCR NEGATIVE NEGATIVE Final   Influenza [place] by PCR NEGATIVE NEGATIVE Final   Influenza B by PCR NEGATIVE NEGATIVE Final    Comment: (NOTE) The Xpert Xpress SARS-CoV-2/FLU/RSV plus assay is intended as an aid in the diagnosis of influenza from Nasopharyngeal swab specimens and should not be used as [place] sole basis for treatment. Nasal washings and aspirates are unacceptable for Xpert Xpress SARS-CoV-2/FLU/RSV testing.  Fact Sheet for Patients: BloggerCourse.com  Fact Sheet for Healthcare Providers: SeriousBroker.it  This test is not yet approved or cleared by the [place] FDA and has been authorized for detection and/or diagnosis of SARS-CoV-2 by FDA under an Emergency Use Authorization (EUA). This EUA will remain in effect (meaning this test can be used) for the duration of the COVID-19 declaration under Section 564(b)(1) of the Act, 21 U.S.C. section 360bbb-3(b)(1), unless the authorization is terminated or revoked.     Resp Syncytial Virus by PCR NEGATIVE NEGATIVE Final     Comment: (NOTE) Fact Sheet for Patients: BloggerCourse.com  Fact Sheet for Healthcare Providers: SeriousBroker.it  This test is not yet approved or cleared by the [place] FDA and has been authorized for detection and/or diagnosis of SARS-CoV-2 by FDA under an Emergency Use Authorization (EUA). This EUA will remain in effect (meaning this test can be used) for the duration of the COVID-19 declaration under Section 564(b)(1) of the Act, 21 U.S.C. section 360bbb-3(b)(1), unless the authorization is terminated or revoked.  Performed at [facility], [address], [place], [place] [address]          Radiology Studies: DG CHEST PORT 1 VIEW  Result Date: 07/24/2023 CLINICAL DATA:  142230 Pleural effusion 242230 EXAM: PORTABLE CHEST 1 VIEW COMPARISON:  CXR 07/22/23 FINDINGS: Right arm PICC with the tip at the cavoatrial junction. Layering left-sided pleural effusion, slightly increased from prior exam. Cardiomegaly. There are prominent bilateral interstitial opacities, favored to represent mild pulmonary edema. No radiographically apparent displaced rib fractures. Visualized upper abdomen is notable for surgical clips in the right upper quadrant. IMPRESSION: 1. Layering left-sided pleural effusion, slightly increased from prior exam. 2. Cardiomegaly with mild pulmonary edema. Electronically Signed   By: Lorenza Cambridge M.D.   On: 07/24/2023 12:18        Scheduled Meds:  ascorbic acid  500 mg Oral Daily   atenolol  12.5 mg Oral q AM   atorvastatin  40 mg Oral QHS   baclofen  20 mg Oral TID   Chlorhexidine Gluconate Cloth  6 each Topical Daily   DULoxetine  30 mg Oral QHS   enoxaparin (LOVENOX) injection  40 mg Subcutaneous Q24H   ferrous sulfate  325 mg Oral QODAY   furosemide  40 mg Intravenous BID   Gerhardt's butt cream   Topical TID   guaiFENesin  600 mg Oral BID   insulin aspart  0-15 Units Subcutaneous TID WC    insulin glargine-yfgn  24 Units Subcutaneous  BID   lamoTRIgine  50 mg Oral BID   leptospermum manuka honey  1 Application Topical Daily   lidocaine  1 patch Transdermal Q24H   nystatin  1 Application Topical Daily   pantoprazole  40 mg Oral Daily   potassium chloride SA  20 mEq Oral Daily   senna-docusate  1 tablet Oral BID   sodium chloride flush  10-40 mL Intracatheter Q12H   traZODone  100 mg Oral QHS   zinc sulfate  220 mg Oral Daily   Continuous Infusions:  piperacillin-tazobactam (ZOSYN)  IV 3.375 g (07/25/23 1750)     LOS: 6 days    Time spent: oer 30 min    Lacretia Nicks, MD [facility]   To contact the attending provider between 7A-7P or the covering provider during after hours 7P-7A, please log into the web site [email or web address] and access using universal [place] password for that web site. If you do not have the password, please call the hospital operator.  07/25/2023, 7:06 PM

## 2023-07-25 NOTE — Consult Note (Cosign Needed Addendum)
Palliative Medicine Inpatient Consult Note  Consulting Provider: Zigmund Daniel., MD   Reason for consult:   Palliative Care Consult Services Palliative Medicine Consult  Reason for Consult? goal of care   07/25/2023  HPI:  Per intake H&P --> Brent [name]. is a 77 y.o. male with medical history significant of quadriparesis and quadriplegia due to transverse myelitis, GI fistula s/p post wedge resection 9/24, neurogenic bladder s/p suprapubic catheter, stage IV decubitus ulcer, insulin-dependent DM type II, asthma who presents with complaints of malaise. Patient admitted and treated for volume overload. Palliative care has been asked to get involved to assist with goals of care conversations.   Clinical Assessment/Goals of Care:  *Please note that this is a verbal dictation therefore any spelling or grammatical errors are due to the "Dragon Medical One" system interpretation.  I have reviewed medical records including EPIC notes, labs and imaging, received report from bedside RN, assessed the patient who is lying in bed complaining of some neck pain.     I met with Valma Cava" at bedside this afternoon to further discuss diagnosis prognosis, GOC, EOL wishes, disposition and options.   I introduced Palliative Medicine as specialized medical care for people living with serious illness. It focuses on providing relief from the symptoms and stress of a serious illness. The goal is to improve quality of life for both the patient and the family.  Medical History Review and Understanding:  A review of [name]'s past medical history inclusive of his quadriplegia, GI fistula, stage IV pressure injury, neurogenic bladder, type 2 diabetes, asthma, heart failure, and coronary artery disease was held.  Social History:  Brent [name] shares with me that he is from [facility] [place] [facility].  He and his spouse, [name] had been married for the past 54 years.  He had a son who sadly died at the age of  55 years old.  Both Brent [name] and his wife are raising their grandson who is 31 years old.  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  Brent [name] shares that he used to love playing golf and softball.  Brent [name] is a man of faith and practices within the [facility] denomination.  Functional and Nutritional State:  Brent [name] has been paraplegic for the last 3 years and has lived at [facility] during that time.  He shares with me that he is dependent for BADLs though is able to self-feed.  He has a good appetite.  Palliative Symptoms:  Neck Pain - Patient takes oxycodone which does cause some relief.   Advance Directives:  A detailed discussion was had today regarding advanced directives.  Patient shares that his surrogate decision maker is his wife, [name]. Patient shares that he does have a living will.  Code Status:  Concepts specific to code status, artifical feeding and hydration, continued IV antibiotics and rehospitalization was had.  The difference between a aggressive medical intervention path  and a palliative comfort care path for this patient at this time was had.   Encouraged patient/family to consider DNR/DNI status understanding evidenced based poor outcomes in similar hospitalized patient, as the cause of arrest is likely associated with advanced chronic/terminal illness rather than an easily reversible acute cardio-pulmonary event. I explained that DNR/DNI does not change the medical plan and it only comes into effect after a person has arrested (died).  It is a protective measure to keep [place] from harming the patient in their last moments of life.  Patient shares he suffered a "cardiac arrest" in 1995 and got three stents thereafter, Brent [name] feels strongly about getting resuscitative efforts. He is aware of the trauma and burden this may cause.  Brent [name] does specify that he would not want to live on a ventilator, would not desire a tracheostomy, and would not  desire a long term G-tube it this was the only measure contributing to continued life. I was honest in sharing   Discussion:  Brent [name] and I discussed his clinical decline over the past few months. I shared with him the chronic disease trajectory and how he appears to be fitting this in that he has one episode occur leading to hospitalization and then declines further. This is a pattern which continues. We reviewed the worry in elation to patients heart failure and probability of recurrent re-hospitalizations.  Patient is aware of the above though wishes to continue present care.  Goals at this time are for patient to eventually transition to his home. [facility] and is [facility] this can occur in the near future.   We discussed meeting with Brent [name] and his wife for further conversations in the oncoming day.   Discussed the importance of continued conversation with family and their  medical providers regarding overall plan of care and treatment options, ensuring decisions are within the context of the patients values and GOCs. ____________________ Addendum:  I called patients wife, [name]. We discussed the above. We plan to meet tomorrow at [facility] for further conversation.   Decision Maker: Kathi Der (Spouse): [phone] (Mobile)   SUMMARY OF RECOMMENDATIONS   FULL CODE - Would not want a tracheostomy, G-Tube, or long term artificial support  Open and honest conversations held in the setting of patients acute on chronic disease burden  Goal of patient is to transition to living back at home  Plan to meet with patient and his wife tomorrow at [facility]  Neck Pain: - Continue oxycodone - Start Lidoderm patches on 12 hours/off 12 hours - Heat application PRN  Ongoing PMT support  Code Status/Advance Care Planning: FULL CODE   Palliative Prophylaxis:  Aspiration, Bowel Regimen, Delirium Protocol, Frequent Pain Assessment, Oral Care, Palliative Wound Care,  and Turn Reposition  Additional Recommendations (Limitations, Scope, Preferences): Continue current care  Psycho-social/Spiritual:  Desire for further Chaplaincy support: Patient is a Restaurant manager, fast food Additional Recommendations: Education on chronic disease trajectory and resuscitation status   Prognosis: (+)6 admission, (+) multiple chronic co-morbidities. High 12 month mortality risk  Discharge Planning: Discharge to [facility] once medically optimized.  Vitals:   07/25/23 0748 07/25/23 1226  BP: 108/66 136/66  Pulse: 71 64  Resp: 12 16  Temp:    SpO2: 100% 98%    Intake/Output Summary (Last 24 hours) at 07/25/2023 1539 Last data filed at 07/25/2023 1500 Gross per 24 hour  Intake 640 ml  Output 1150 ml  Net -510 ml   Last Weight  Most recent update: 07/25/2023  4:21 AM    Weight  90.6 kg (199 lb 13.6 oz)            Gen:  Elderly Caucasian M chronically ill appearing HEENT: moist mucous membranes CV: Regular rate and rhythm  PULM: On 1LPM [place], breathing is even and nonlabored ABD: soft/nontender  EXT: No edema  Neuro: Alert and oriented x3   PPS: 30%   This conversation/these recommendations were discussed with patient primary care team, Dr. Lowell Guitar  Billing based on MDM: High  Problems  Addressed: One acute or chronic illness or injury that poses a threat to life or bodily function  Amount and/or Complexity of Data: Category 3:Discussion of management or test interpretation with external physician/other qualified health care professional/appropriate source (not separately reported)  Risks: Decision regarding hospitalization or escalation of hospital care and Decision not to resuscitate or to de-escalate care because of poor prognosis ______________________________________________________ Lamarr Lulas [facility] Palliative Medicine Team Team Cell Phone: [phone] Please utilize secure chat with additional questions, if there is no response within 30  minutes please call the above phone number  Palliative Medicine Team providers are available by phone from 7am to 7pm daily and can be reached through the team cell phone.  Should this patient require assistance outside of these hours, please call the patient's attending physician.

## 2023-07-25 NOTE — Plan of Care (Signed)
  Problem: Education: Goal: Knowledge of General Education information will improve Description: Including pain rating scale, medication(s)/side effects and non-pharmacologic comfort measures Outcome: Progressing   Problem: Health Behavior/Discharge Planning: Goal: Ability to manage health-related needs will improve Outcome: Progressing   Problem: Clinical Measurements: Goal: Ability to maintain clinical measurements within normal limits will improve Outcome: Progressing Goal: Will remain free from infection Outcome: Progressing Goal: Diagnostic test results will improve Outcome: Progressing Goal: Respiratory complications will improve Outcome: Progressing Goal: Cardiovascular complication will be avoided Outcome: Progressing   Problem: Activity: Goal: Risk for activity intolerance will decrease Outcome: Progressing   Problem: Nutrition: Goal: Adequate nutrition will be maintained Outcome: Progressing   Problem: Coping: Goal: Level of anxiety will decrease Outcome: Progressing   Problem: Elimination: Goal: Will not experience complications related to urinary retention Outcome: Progressing   Problem: Pain Management: Goal: General experience of comfort will improve Outcome: Progressing   Problem: Safety: Goal: Ability to remain free from injury will improve Outcome: Progressing   Problem: Skin Integrity: Goal: Risk for impaired skin integrity will decrease Outcome: Progressing   Problem: Education: Goal: Ability to describe self-care measures that may prevent or decrease complications (Diabetes Survival Skills Education) will improve Outcome: Progressing Goal: Individualized Educational Video(s) Outcome: Progressing   Problem: Coping: Goal: Ability to adjust to condition or change in health will improve Outcome: Progressing   Problem: Fluid Volume: Goal: Ability to maintain a balanced intake and output will improve Outcome: Progressing   Problem: Health  Behavior/Discharge Planning: Goal: Ability to identify and utilize available resources and services will improve Outcome: Progressing Goal: Ability to manage health-related needs will improve Outcome: Progressing   Problem: Metabolic: Goal: Ability to maintain appropriate glucose levels will improve Outcome: Progressing   Problem: Nutritional: Goal: Maintenance of adequate nutrition will improve Outcome: Progressing Goal: Progress toward achieving an optimal weight will improve Outcome: Progressing   Problem: Skin Integrity: Goal: Risk for impaired skin integrity will decrease Outcome: Progressing   Problem: Tissue Perfusion: Goal: Adequacy of tissue perfusion will improve Outcome: Progressing   Problem: Safety: Goal: Non-violent Restraint(s) Outcome: Progressing

## 2023-07-26 ENCOUNTER — Inpatient Hospital Stay (HOSPITAL_COMMUNITY): Payer: Medicare Other

## 2023-07-26 DIAGNOSIS — Z515 Encounter for palliative care: Secondary | ICD-10-CM | POA: Diagnosis not present

## 2023-07-26 DIAGNOSIS — R410 Disorientation, unspecified: Secondary | ICD-10-CM | POA: Diagnosis not present

## 2023-07-26 DIAGNOSIS — Z7189 Other specified counseling: Secondary | ICD-10-CM | POA: Diagnosis not present

## 2023-07-26 DIAGNOSIS — D72829 Elevated white blood cell count, unspecified: Secondary | ICD-10-CM | POA: Diagnosis not present

## 2023-07-26 DIAGNOSIS — Z8739 Personal history of other diseases of the musculoskeletal system and connective tissue: Secondary | ICD-10-CM | POA: Diagnosis not present

## 2023-07-26 DIAGNOSIS — J90 Pleural effusion, not elsewhere classified: Secondary | ICD-10-CM

## 2023-07-26 DIAGNOSIS — F05 Delirium due to known physiological condition: Secondary | ICD-10-CM

## 2023-07-26 LAB — GLUCOSE, CAPILLARY
Glucose-Capillary: 169 mg/dL — ABNORMAL HIGH (ref 70–99)
Glucose-Capillary: 189 mg/dL — ABNORMAL HIGH (ref 70–99)
Glucose-Capillary: 129 mg/dL — ABNORMAL HIGH (ref 70–99)
Glucose-Capillary: 105 mg/dL — ABNORMAL HIGH (ref 70–99)

## 2023-07-26 MED ORDER — INSULIN ASPART 100 UNIT/ML IJ SOLN
0.0000 [IU] | Freq: Three times a day (TID) | INTRAMUSCULAR | Status: DC
Start: 2023-07-26 — End: 2023-07-27
  Administered 2023-07-26: 3 [IU] via SUBCUTANEOUS
  Administered 2023-07-26 – 2023-07-27 (×2): 2 [IU] via SUBCUTANEOUS

## 2023-07-26 MED ORDER — ALBUMIN HUMAN 25 % IV SOLN
25.0000 g | Freq: Four times a day (QID) | INTRAVENOUS | Status: AC
  Administered 2023-07-26 – 2023-07-27 (×4): 25 g via INTRAVENOUS
  Filled 2023-07-26 (×4): qty 100

## 2023-07-26 MED ORDER — JUVEN PO PACK
1.0000 | PACK | Freq: Two times a day (BID) | ORAL | Status: DC
  Administered 2023-07-26 – 2023-07-27 (×2): 1 via ORAL
  Filled 2023-07-26 (×2): qty 1

## 2023-07-26 MED ORDER — ENSURE ENLIVE PO LIQD
237.0000 mL | Freq: Every day | ORAL | Status: DC
  Administered 2023-07-27: 237 mL via ORAL

## 2023-07-26 MED ORDER — INSULIN ASPART 100 UNIT/ML IJ SOLN
0.0000 [IU] | Freq: Every day | INTRAMUSCULAR | Status: DC
Start: 2023-07-26 — End: 2023-07-27

## 2023-07-26 MED ORDER — INSULIN ASPART 100 UNIT/ML IJ SOLN
4.0000 [IU] | Freq: Three times a day (TID) | INTRAMUSCULAR | Status: DC
  Administered 2023-07-26 – 2023-07-27 (×3): 4 [IU] via SUBCUTANEOUS

## 2023-07-26 MED ORDER — FUROSEMIDE 40 MG PO TABS
40.0000 mg | ORAL_TABLET | Freq: Every day | ORAL | Status: DC
  Administered 2023-07-26 – 2023-07-27 (×2): 40 mg via ORAL
  Filled 2023-07-26 (×2): qty 1

## 2023-07-26 MED ORDER — FUROSEMIDE 10 MG/ML IJ SOLN: 40.0000 mg | Freq: Two times a day (BID) | INTRAMUSCULAR | Status: DC

## 2023-07-26 MED ORDER — POTASSIUM CHLORIDE CRYS ER 20 MEQ PO TBCR
40.0000 meq | EXTENDED_RELEASE_TABLET | Freq: Two times a day (BID) | ORAL | Status: AC
  Administered 2023-07-26 (×2): 40 meq via ORAL
  Filled 2023-07-26 (×2): qty 2

## 2023-07-26 NOTE — Progress Notes (Addendum)
Initial Nutrition Assessment  DOCUMENTATION CODES:   Not applicable  INTERVENTION:   -Supplement meals with Ensure Enlive po daily, each supplement provides 350 kcal and 20 grams of protein and Magic cup BID with meals, each supplement provides 290 kcal and 9 grams of protein.  Note Magic Cup will not count towards fluid as thickens to pudding thickness after thawing.   -Provide -1 packet Juven BID, each packet provides 95 calories, 2.5 grams of protein (collagen), and 9.8 grams of carbohydrate (3 grams sugar); also contains 7 grams of L-arginine and L-glutamine, 300 mg vitamin C, 15 mg vitamin E, 1.2 mcg vitamin B-12, 9.5 mg zinc, 200 mg calcium, and 1.5 g  Calcium Beta-hydroxy-Beta-methylbutyrate to support wound healing  -Continue with vitamin C 500 mg daily and zinc sulfate 220 mg for micronutrient support to aide in wound healing.  -Consider to liberalize diet to regular if intakes trend <75%.    NUTRITION DIAGNOSIS:   Increased nutrient needs related to wound healing as evidenced by estimated needs.   GOAL:    Patient will meet greater than or equal to 90% of their needs    MONITOR:   PO intake, Labs, Supplement acceptance, Weight trends, Skin  REASON FOR ASSESSMENT:   Consult Assessment of nutrition requirement/status  ASSESSMENT: 77 y/o male admitted with pleural effusion.  PMH: quadriparesis and quadriplegia due to transverse myelitis, GI fistula S/P wedge resection 06/13/23, neurogenic bladder S/P suprapubic catheter, stage IV decubitus ulcer, insulin dependent DM type II, asthma, acute osteomyelitis of metatarsal bone of right foot, aortic atherosclerosis, CAD, angioplasty of right coronary artery followed by rotational atherectomy after for stent restenosis, diverticulosis, elevated CK, HTN, HLD, hypertriglyceridemia, malignant melanoma left side of neck, MI, obesity, OSA, persistent disorder of initiating or maintaining sleep, renal lesion, septic arthritis of foot,  toe amputation fifth ray, cholecystectomy, endoscopic mucosal resection, smoking hx, alcohol use-reports 7-14 standard drinks weekly..    Patient endorses a good appetite during hospitalization and at facility he resides,  consumes three meals daily on usual basis. and denies a special meal plan there. He is asking for a pizza at [facility] which was ordered for a time one time basis. Denies chewing or swallowing difficulty with food and or liquids. He requires feeding assist which he states he is getting adequate help. Intake recorded average 52% x 6 meals.   Per EMR, weight fluctuations noted-likely representing fluid and or scale differentials , 6% weight change past two months using data from 05/25/23 of 97.2 kg. Edema non-pitting generalized, LUE, RUE, RLE, LLE 11/5.  Medications reviewed and include vitamin C 500 mg daily, ferrous sulfate 325 mg every other day, lasix 40 mg daily, novolog 4 units & SS 3x daily with meals and SS at bedtime, insulin glargine-yfgn 24 units 2x daily, PPI, potassium chloride 40 mEq daily, senokot-S, zinc sulfate 220 mg daily, albumin continuous,   Labs: glucose 281, CBG 143-246 past 24 hrs, hgb A1C 7.5% (06/06/23), potassium 3.3   Intake/Output Summary (Last 24 hours) at 07/26/2023 1356 Last data filed at 07/26/2023 1230 Gross per 24 hour  Intake 911.85 ml  Output 1300 ml  Net -388.15 ml   Net IO Since Admission: -8,858.04 mL [07/26/23 1356]     NUTRITION - FOCUSED PHYSICAL EXAM: Note muscle depletion in lower extremities and hands likely reflect immobility due to quadriplegia.  Flowsheet Row Most Recent Value  Orbital Region No depletion  Upper Arm Region Mild depletion  Thoracic and Lumbar Region No depletion  Buccal Region Mild  depletion  Temple Region Mild depletion  Clavicle Bone Region Mild depletion  Clavicle and Acromion Bone Region No depletion  Scapular Bone Region Unable to assess  [patient was unable to reposition]  Dorsal Hand Moderate depletion   Patellar Region Mild depletion  Anterior Thigh Region Mild depletion  Posterior Calf Region Mild depletion  Hair Reviewed  Eyes Reviewed  Mouth Reviewed  Skin Reviewed  Nails Reviewed  [pallor nail beds]       Diet Order:   Diet Order             Diet heart healthy/carb modified Room service appropriate? Yes with Assist; Fluid consistency: Thin; Fluid restriction: 1200 mL Fluid  Diet effective now                   EDUCATION NEEDS:   Education needs have been addressed  Skin:  Skin Assessment: Skin Integrity Issues: Skin Integrity Issues:: Stage III, Other (Comment), Incisions DTI: bilateral buttock Stage III: ichial tuberosity Incisions: diabetic ulcer to right foot, abdominal  Last BM:  11/3, type 7-small  Height:   Ht Readings from Last 1 Encounters:  07/19/23 6\' 1"  (1.854 m)    Weight:   Wt Readings from Last 1 Encounters:  07/25/23 90.6 kg    Ideal Body Weight:  75 kg (adjustment of IBW by 10% to account for quadriplegia.)  BMI:  Body mass index is 26.37 kg/m.  Estimated Nutritional Needs:   Kcal:  1900-2250 kcal/day  Protein:  98-113 gm/day  Fluid:  1200 mL/day fluid restriction    Alvino Chapel, RDLD Clinical Dietitian See AMION for contact information

## 2023-07-26 NOTE — Progress Notes (Signed)
Palliative Medicine Inpatient Follow Up Note HPI: Brent [name]. is a 77 y.o. male with medical history significant of quadriparesis and quadriplegia due to transverse myelitis, GI fistula s/p post wedge resection 9/24, neurogenic bladder s/p suprapubic catheter, stage IV decubitus ulcer, insulin-dependent DM type II, asthma who presents with complaints of malaise. Patient admitted and treated for volume overload. Palliative care has been asked to get involved to assist with goals of care conversations.   Today's Discussion 07/26/2023  *Please note that this is a verbal dictation therefore any spelling or grammatical errors are due to the "[employer]agon [employer] One" system interpretation.  Chart reviewed inclusive of vital signs, progress notes, laboratory results, and diagnostic images.   I met with [name] at bedside this afternoon. He is alert to self and place though he is not completely clear on situation. He is in good spirits overall and shares with me that he is trying to order a pizza on his phone though he is not able to do so. I tried to help but his pass-code and email were not matching up.   [name] shares that he is feeling well today overall. He has improvement in his neck pain. He otherwise denies complaints other than desiring discharge.  I had a long conversation with patients wife, [name]. Created space and opportunity for her to explore thoughts feelings and fears regarding patients current medical situation. We discussed that at this time patient is at high risk for clinical decline. We reviewed that he is continuously readmitted to the hospital and has tremendous disease burden. I shared that these things are poor long term prognosticators. Patients wife does understand and acknowledge Chalmers Guest declining health state. [name] shares the hope for improvements overtime though recognizes this may not be a reality. She and I reviewed the plan she has in place to try to accomplish the wish of  getting [place] home in the near future.  I did discuss openly and honestly with [name] that if [name] were to continue to decline and lack capacity in relation to his health she would be his surrogate. [name] is aware of this.   Questions and concerns addressed/Palliative Support Provided.   Objective Assessment: Vital Signs Vitals:   07/26/23 0758 07/26/23 1209  BP: (!) 131/53 (!) 120/57  Pulse: 65 63  Resp: 16 16  Temp:    SpO2: 99% 99%    Intake/Output Summary (Last 24 hours) at 07/26/2023 1428 Last data filed at 07/26/2023 1230 Gross per 24 hour  Intake 911.85 ml  Output 1300 ml  Net -388.15 ml   Last Weight  Most recent update: 07/25/2023  4:21 AM    Weight  90.6 kg (199 lb 13.6 oz)            Gen:  Elderly Caucasian M chronically ill appearing HEENT: moist mucous membranes CV: Regular rate and rhythm  PULM: On RA, breathing is even and nonlabored ABD: soft/nontender  EXT: No edema  Neuro: Alert and oriented x3   SUMMARY OF RECOMMENDATIONS   FULL CODE - Would not want a tracheostomy, G-Tube, or long term artificial support   Open and honest conversations held in the setting of patients acute on chronic disease burden   Goal of patient is to transition to living back at home  Neck Pain --> Improved: - Continue oxycodone - Continue Lidoderm patches on 12 hours/off 12 hours - Heat application PRN   Ongoing PMT support  Billing based on MDM: High ______________________________________________________________________________________ Lamarr Lulas [facility] Palliative Medicine Team Team Cell Phone: [phone] Please utilize secure chat with additional questions, if there is no response within 30 minutes please call the above phone number  Palliative Medicine Team providers are available by phone from 7am to 7pm daily and can be reached through the team cell phone.  Should this patient require assistance outside of these hours, please call the patient's  attending physician.

## 2023-07-26 NOTE — Progress Notes (Signed)
NAME:  Dierre Crevier., MRN:  [id number], DOB:  21-Feb-1946, LOS: 7 ADMISSION DATE:  07/19/2023, CONSULTATION DATE:  10/30 REFERRING MD:  Dr. Lockie Mola, CHIEF COMPLAINT:  pleural effusions   History of Present Illness:  Patient is a 77 yo M w/ pertinent PMH quadriparesis/quadriplegia from transverse myelitis, HFpEF, hx of osteomyelitis of right fifth metatarsal and septic arthritis of fifth MTP joint on zosyn until 08/14/2023, GI fistula s/p wedge resection 05/2023, neurogenic bladder with suprapubic catheter, DMT2, htn, hld, asthma presents to [facility] w/ generalized weakness.   Patient recently hospitalized on 10/11-10/17 w/ right foot osteomyelitis. OR cultures growing. Providencia/Proteus/E faecalis/Bacteroides.  Scheduled to be on 6 weeks of IV Zosyn per ID until 08/14/2023.   On 10/25 patient readmitted w/ sob. CXR w/ increasing small pleural effusions. Patient given lasix and discharged on 10/28.   On 10/30 patient w/ generalized weakness. EMS called and noted to be bradycardic w/ HR 40-50s. She was complaining of neck pain, HA, nausea. Transferred to [facility]. On arrival patient hypothermic 94.3 and WBC 11.8 (7.6 4 days ago). LA 1.7. BP stable and HR 50s. Cultures obtained. Trop 4 and bnp 136. CXR 10/30 w/ improved pleural effusions compared to prior CXR on 10/26. CT chest showing moderate sized L>R pleural effusions w/ basilar atelectasis; consolidative opacities in LLL possible atelectasis. PCCM consulted.  Pertinent  Medical History   has a past medical history of Acute osteomyelitis of metatarsal bone of right foot (HCC) (07/01/2023), Allergy, Aortic atherosclerosis (HCC) (07/01/2023), Benign neuroendocrine tumor of stomach (04/04/2022), CAD (coronary artery disease), Diabetes mellitus, Diverticulosis, Elevated CK, Erectile dysfunction, Hemorrhoids, HTN (hypertension), Hyperlipidemia, Hypertriglyceridemia, Malignant melanoma of left side of neck (HCC) (10/25/2018), Myocardial infarction (HCC),  Obesity, OSA (obstructive sleep apnea), Persistent disorder of initiating or maintaining sleep, Personal history of colonic polyps (02/05/2003), Renal lesion (06/20/2016), Septic arthritis of foot (HCC) (07/01/2023), and Stage IV decubitus ulcer (HCC) (06/30/2023).  Significant Hospital Events: Including procedures, antibiotic start and stop dates in addition to other pertinent events     Interim History / Subjective:  No acute distress, checks x-ray ordered  Objective   Blood pressure (!) 131/53, pulse 65, temperature 98.9 F (37.2 C), temperature source Oral, resp. rate 16, height 6\' 1"  (1.854 m), weight 90.6 kg, SpO2 99%.        Intake/Output Summary (Last 24 hours) at 07/26/2023 0908 Last data filed at 07/26/2023 0438 Gross per 24 hour  Intake 371.85 ml  Output 600 ml  Net -228.15 ml   Filed Weights   07/24/23 1413 07/25/23 0349  Weight: 90.7 kg 90.6 kg    Examination: Elderly male somewhat confused but does answer questions He is quadriparetic No JVD or lymphadenopathy appreciated Speech is clear Creased breath sounds in the bases, he is currently on room air no acute distress Heart sounds are regular Abdomen soft nontender Extremities are warm        Resolved Hospital Problem list   N/a  Assessment & Plan:   Intake/Output Summary (Last 24 hours) at 07/26/2023 0908 Last data filed at 07/26/2023 0438 Gross per 24 hour  Intake 371.85 ml  Output 600 ml  Net -228.15 ml    Bilateral pleural effusions: Present and progressive on serial CT scans dating back to September 2024.  Suspect worsened in the setting of recent hospitalization IV fluid load etc.  They have improved radiographically since admission, and although the left is slightly larger than the R, it is not large enough to safely tap. I do not  see high risk radiographic features to suggest an increased risk for infection or exudative fluid. With his recent hospitalization for CHF exacerbation, this is  likely related.  Suspect related to cardiogenic volume overload.  He is on room air at rest, breathing comfortably.  Diuresis or euvolemia Chest x-ray ordered to evaluate pleural effusions He is not in any acute distress Continue low-salt diet and fluid restriction He has quadriparesis and decubitus are main driving factors at this time    Best Practice (right click and "Reselect all SmartList Selections" daily)   N/a  Labs   CBC: Recent Labs  Lab 07/20/23 0731 07/21/23 0624  WBC 9.3 9.6  HGB 9.1* 10.2*  HCT 30.8* 33.6*  MCV 83.2 82.6  PLT 377 396    Basic Metabolic Panel: Recent Labs  Lab 07/20/23 0731 07/21/23 0624 07/22/23 1357 07/25/23 1130  NA 136 139 136 133*  K 3.4* 4.0 3.8 3.3*  CL 95* 103 100 95*  CO2 30 24 24 28   GLUCOSE 161* 159* 176* 281*  BUN 10 8 12 19   CREATININE 0.84 0.72 0.78 0.93  CALCIUM 8.6* 8.5* 8.9 8.5*   GFR: Estimated Creatinine Clearance: 74 mL/min (by C-G formula based on SCr of 0.93 mg/dL). Recent Labs  Lab 07/20/23 0731 07/21/23 0624  WBC 9.3 9.6    Liver Function Tests: Recent Labs  Lab 07/22/23 1357  AST 22  ALT 20  ALKPHOS 83  BILITOT 0.5  PROT 6.3*  ALBUMIN 2.6*   No results for input(s): "LIPASE", "AMYLASE" in the last 168 hours. Recent Labs  Lab 07/22/23 1357  AMMONIA 25    ABG    Component Value Date/Time   PHART 7.405 03/15/2020 2305   PCO2ART 40.4 03/15/2020 2305   PO2ART 113 (H) 03/15/2020 2305   HCO3 31.3 (H) 07/22/2023 1355   TCO2 27 07/19/2023 0933   TCO2 30 07/19/2023 0933   O2SAT 78.7 07/22/2023 1355     Coagulation Profile: No results for input(s): "INR", "PROTIME" in the last 168 hours.   Cardiac Enzymes: No results for input(s): "CKTOTAL", "CKMB", "CKMBINDEX", "TROPONINI" in the last 168 hours.  HbA1C: Hgb A1c MFr Bld  Date/Time Value Ref Range Status  06/06/2023 02:36 AM 7.5 (H) 4.8 - 5.6 % Final    Comment:    (NOTE) Pre diabetes:          5.7%-6.4%  Diabetes:               >6.4%  Glycemic control for   <7.0% adults with diabetes   05/22/2023 03:44 AM 8.0 (H) 4.8 - 5.6 % Final    Comment:    (NOTE) Pre diabetes:          5.7%-6.4%  Diabetes:              >6.4%  Glycemic control for   <7.0% adults with diabetes     CBG: Recent Labs  Lab 07/25/23 1224 07/25/23 1618 07/25/23 1740 07/25/23 2058 07/26/23 0755  GLUCAP 246* 208* 190* 204* 169*    Steve Kegan Mckeithan ACNP Acute Care Nurse Practitioner [facility] Please consult Amion 07/26/2023, 9:08 AM  .

## 2023-07-26 NOTE — Progress Notes (Addendum)
[facility] PROGRESS NOTE    Progress Note  Brent [name].  ZOX:[id number] DOB: [birth year] DOA: 07/19/2023 PCP: [name], MD     Brief Narrative:   Brent Haegele. is an 77 y.o. 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  Then again readmitted and discharged from the hospital on 07/17/2023 secondary to acute diastolic heart failure as per wife he has had intermittent confusion, chest x-ray was done that showed left-sided effusion pulmonary was consulted recommended to continue IV Lasix.  Wife relates he continues to have poor oral intake palliative care was consulted.  Palliative care was consulted the patient remains a full code does not want tracheostomy G-tube long-term artificial support.   Assessment/Plan:   Goals of care: Per palliative care.  Acute confusional state/delirium: Patient had to be placed on mittens as he continues to have intermittent confusions for the last few months. CT of the head showed no acute findings B12 and folate are within normal.  Ammonia level 25. Suspect is related to acute hospitalization. Improved clinically Will need neurology follow-up as an outpatient for cognitive decline.  Chronic diastolic heart failure/pleural effusion: Lasix 60 was increased to twice a day due to lack of improvement of effusion. Pulm. was consulted recommended against thoracocentesis. they recommended manage of diuresis.  Sinus bradycardia/transient hypotension: [place] heart  rate was 40s and 50s with borderline blood pressure. Antihypertensive medications were held, atenolol has been resumed. Continue to hold losartan and Aldactone.  SVT: Noted on 07/22/2023, metoprolol restarted.  Poor oral intake: Per the wife started eating some.  Chest discomfort/shortness of breath/lightheadedness: Now resolved.  Treating his volume overload as above.  Leukocytosis: Improved.  History of osteomyelitis of the right fifth metatarsal/septic arthritis of fifth MTP joint: Currently on IV Zosyn for 6 weeks end date of 08/14/2023. PICC line was replaced on 07/23/2023 continue Zosyn.  Diabetes mellitus type 2 with long-term insulin: Last A1c of 7.5.,  Blood glucose trending up. Increase long-acting insulin and continue sliding scale. Holding metformin.  Essential hypertension: Blood pressure is trending up, continue Lasix and atenolol. Of the ARB and losartan  Normocytic anemia: Follow-up with PCP as an outpatient.  Stage IV sacral decubitus ulcer/ischial decubitus ulcer: Continue low air mattress. Wound care recommended: Clean plantar foot intact sutures with NS, apply Xeroform to incision site daily, cover with dry gauze or silicone foam.  Clean Sacral and Ischial wounds with Vashe wound cleanser [name] [phone]), apply Medihoney to wound beds daily, cover with dry gauze.  Apply Gerhardt's Butt Cream to surrounding skin of sacrum/coccyx/buttocks 3 times daily and prn soiling. May cover with ABD pad or silicone foams whichever is preferred. Paraplegia secondary to transverse myelitis/neurogenic bladder with super catheter: Continue baclofen. Suprapubic catheter exchange 07/25/2023.  History of gastrocolic fistula status post colostomy: Continue colostomy care.  Hyperlipidemia: Continue statins.  Depression:  Continue Cymbalta.  Sacral decubitus ulcer stage IV and heel ulcer present on admission: RN Pressure Injury Documentation: Pressure Injury 02/22/23  (Active)  02/22/23 0800  Location:   Location Orientation:   Staging:   Wound Description (Comments):  Present on Admission: No     Pressure Injury 07/01/23 Buttocks Bilateral Deep Tissue Pressure Injury - Purple or maroon localized area of discolored intact skin or blood-filled blister due to damage of underlying soft tissue from pressure and/or shear. Erythema and purple discolorat (Active)  07/01/23 0732  Location: Buttocks  Location Orientation: Bilateral  Staging: Deep Tissue Pressure Injury - Purple or maroon localized area of discolored intact skin or blood-filled blister due to damage of underlying soft tissue from pressure and/or shear.  Wound Description (Comments): Erythema and purple discoloration to entire sacrum/buttocks consistent with chronic tissue damage and healing Stage 4 Pressure Injury; scattered areas of full thickness skin loss that appear pink and moist  Present on Admission: Yes  Dressing Type Foam - Lift dressing to assess site every shift 07/24/23 2000     Pressure Injury 07/01/23 Ischial tuberosity Posterior;Proximal;Right Stage 3 -  Full thickness tissue loss. Subcutaneous fat may be visible but bone, tendon or muscle are NOT exposed. pink  area with white/yellow center (Active)  07/01/23 0732  Location: Ischial tuberosity  Location Orientation: Posterior;Proximal;Right  Staging: Stage 3 -  Full thickness tissue loss. Subcutaneous fat may be visible but bone, tendon or muscle are NOT exposed.  Wound Description (Comments): pink  area with white/yellow center  Present on Admission: Yes  Dressing Type Foam - Lift dressing to assess site every shift 07/25/23 0800    DVT prophylaxis: lovenox Family Communication:Wife Status is: Inpatient Remains inpatient appropriate because: I do not know why he is admitted    Code Status:     Code Status Orders  (From admission, onward)           Start     Ordered   07/19/23 1652  Full code  Continuous        Question:  By:  Answer:  Consent: discussion documented in EHR   07/19/23 1652           Code Status History     Date Active Date Inactive Code Status Order ID Comments User Context   07/14/2023 1514 07/18/2023 0307 Full Code [id number]  Clydie Braun, MD ED   06/30/2023 1720 07/06/2023 2348 Full Code [id number]  Nolberto Hanlon, MD ED   06/05/2023 2100 06/06/2023 2020 Full Code [id number]  Tereasa Coop, MD ED   05/21/2023 1528 06/01/2023 0403 Full Code [id number]  Leatha Gilding, MD ED   02/21/2023 0914 02/25/2023 1652 Full Code [id number]  Clydie Braun, MD ED   02/21/2023 0523 02/21/2023 0914 Full Code [id number]  Howerter, Chaney Born, DO ED   03/25/2022 2125 03/27/2022 0112 Full Code [id number]  Hillary Bow, DO ED   08/06/2021 0432 08/10/2021 2207 Full Code [id number]  Briscoe Deutscher, MD ED   12/21/2020 2252 12/26/2020 1730 Full Code [id number]  John Giovanni, MD ED   11/16/2020 1333 11/22/2020 2059 DNR [id number]  Jae Dire, MD ED   11/16/2020 1137 11/16/2020 1333 DNR [id number]  Bethann Berkshire, MD ED   06/01/2020 1517 06/11/2020 0407 Full Code [id number]  Emeline General, MD ED   03/25/2020 1501 04/24/2020 2250 Full Code [id number]  Charlton Amor, PA-C Inpatient   03/25/2020 1501 03/25/2020 1501 Full Code [id number]  Lynnae Prude Inpatient   03/08/2020 1744 03/25/2020 1457 Full Code [id number]  Emeline General, MD ED      Advance Directive Documentation    Flowsheet Row Most Recent Value  Type of Advance Directive Out of facility DNR (pink MOST  or yellow form)  Pre-existing out of facility DNR order (yellow form or pink MOST form) --  "MOST" Form in Place? --         IV Access:   Peripheral IV   Procedures and diagnostic studies:   DG CHEST PORT 1 VIEW  Result Date: 07/24/2023 CLINICAL DATA:  142230 Pleural effusion 242230 EXAM: PORTABLE CHEST 1 VIEW COMPARISON:  CXR 07/22/23 FINDINGS: Right arm PICC with the tip at the cavoatrial junction. Layering left-sided pleural effusion,  slightly increased from prior exam. Cardiomegaly. There are prominent bilateral interstitial opacities, favored to represent mild pulmonary edema. No radiographically apparent displaced rib fractures. Visualized upper abdomen is notable for surgical clips in the right upper quadrant. IMPRESSION: 1. Layering left-sided pleural effusion, slightly increased from prior exam. 2. Cardiomegaly with mild pulmonary edema. Electronically Signed   By: Lorenza Cambridge M.D.   On: 07/24/2023 12:18     Medical Consultants:   None.   Subjective:    Brent [name]. very argumentative this morning no complaints he relates he is hungry.  Objective:    Vitals:   07/25/23 0748 07/25/23 1226 07/25/23 1619 07/26/23 0650  BP: 108/66 136/66 133/88 (!) 165/64  Pulse: 71 64 72 66  Resp: 12 16 16    Temp:      TempSrc:      SpO2: 100% 98% 99%   Weight:      Height:       SpO2: 99 % O2 Flow Rate (L/min): 1 L/min   Intake/Output Summary (Last 24 hours) at 07/26/2023 0719 Last data filed at 07/26/2023 0438 Gross per 24 hour  Intake 851.85 ml  Output 600 ml  Net 251.85 ml   Filed Weights   07/24/23 1413 07/25/23 0349  Weight: 90.7 kg 90.6 kg    Exam: General exam: In no acute distress. Respiratory system: Good air movement and clear to auscultation. Cardiovascular system: S1 & S2 heard, RRR.  Gastrointestinal system: Abdomen is nondistended, soft and nontender.  Extremities: No pedal edema.   Data Reviewed:    Labs: Basic Metabolic Panel: Recent Labs  Lab 07/19/23 0920 07/19/23 0933 07/20/23 0731 07/21/23 0624 07/22/23 1357 07/25/23 1130  NA 140 140  140 136 139 136 133*  K 3.6 3.6  3.6 3.4* 4.0 3.8 3.3*  CL 102 100 95* 103 100 95*  CO2 26  --  30 24 24 28   GLUCOSE 142* 141* 161* 159* 176* 281*  BUN 9 8 10 8 12 19   CREATININE 0.60* 0.50* 0.84 0.72 0.78 0.93  CALCIUM 8.6*  --  8.6* 8.5* 8.9 8.5*   GFR Estimated Creatinine Clearance: 74 mL/min (by C-G formula based on SCr of  0.93 mg/dL). Liver Function Tests: Recent Labs  Lab 07/19/23 0920 07/22/23 1357  AST 19 22  ALT 16 20  ALKPHOS 67 83  BILITOT 0.4 0.5  PROT 5.5* 6.3*  ALBUMIN 2.3* 2.6*   No results for input(s): "LIPASE", "AMYLASE" in the last 168 hours. Recent Labs  Lab 07/19/23 0920 07/22/23 1357  AMMONIA 16 25   Coagulation profile Recent Labs  Lab 07/19/23 0920  INR 1.1   COVID-19 Labs  No results for input(s): "DDIMER", "FERRITIN", "LDH", "CRP" in the last 72 hours.  Lab Results  Component Value Date   SARSCOV2NAA NEGATIVE 07/19/2023   SARSCOV2NAA NEGATIVE 07/14/2023   SARSCOV2NAA POSITIVE (A) 11/01/2021   SARSCOV2NAA NEGATIVE 08/09/2021    CBC: Recent Labs  Lab 07/19/23 0920 07/19/23 0933 07/20/23 0731 07/21/23 1610  WBC 11.8*  --  9.3 9.6  NEUTROABS 9.5*  --   --   --   HGB 9.4* 10.2*  9.9* 9.1* 10.2*  HCT 32.0* 30.0*  29.0* 30.8* 33.6*  MCV 84.9  --  83.2 82.6  PLT 375  --  377 396   Cardiac Enzymes: No results for input(s): "CKTOTAL", "CKMB", "CKMBINDEX", "TROPONINI" in the last 168 hours. BNP (last 3 results) No results for input(s): "PROBNP" in the last 8760 hours. CBG: Recent Labs  Lab 07/25/23 0744 07/25/23 1224 07/25/23 1618 07/25/23 1740 07/25/23 2058  GLUCAP 143* 246* 208* 190* 204*   D-Dimer: No results for input(s): "DDIMER" in the last 72 hours. Hgb A1c: No results for input(s): "HGBA1C" in the last 72 hours. Lipid Profile: No results for input(s): "CHOL", "HDL", "LDLCALC", "TRIG", "CHOLHDL", "LDLDIRECT" in the last 72 hours. Thyroid function studies: No results for input(s): "TSH", "T4TOTAL", "T3FREE", "THYROIDAB" in the last 72 hours.  Invalid input(s): "FREET3" Anemia work up: No results for input(s): "VITAMINB12", "FOLATE", "FERRITIN", "TIBC", "IRON", "RETICCTPCT" in the last 72 hours. Sepsis Labs: Recent Labs  Lab 07/19/23 0920 07/19/23 0933 07/20/23 0731 07/21/23 0624  PROCALCITON <0.10  --   --   --   WBC 11.8*  --  9.3  9.6  LATICACIDVEN  --  1.7  --   --    Microbiology Recent Results (from the past 240 hour(s))  Blood Culture (Routine X 2)     Status: None   Collection Time: 07/19/23  8:55 AM   Specimen: BLOOD  Result Value Ref Range Status   Specimen Description BLOOD SITE NOT SPECIFIED  Final   Special Requests   Final    BOTTLES DRAWN AEROBIC ONLY Blood Culture adequate volume   Culture   Final    NO GROWTH 5 DAYS Performed at [facility], [address] [place], [place] [address]    Report Status 07/24/2023 FINAL  Final  Blood Culture (Routine X 2)     Status: None   Collection Time: 07/19/23  9:00 AM   Specimen: BLOOD  Result Value Ref Range Status   Specimen Description BLOOD SITE NOT SPECIFIED  Final   Special Requests   Final    BOTTLES DRAWN AEROBIC ONLY Blood Culture adequate volume   Culture   Final    NO GROWTH 5 DAYS Performed at [facility], [address], [place], [place] [address]    Report Status 07/24/2023 FINAL  Final  Resp panel by RT-PCR (RSV, Flu A&B, Covid) Anterior Nasal Swab     Status: None   Collection Time: 07/19/23  9:45 AM   Specimen: Anterior Nasal Swab  Result Value Ref Range Status   SARS Coronavirus 2 by RT PCR NEGATIVE NEGATIVE Final   Influenza A by PCR NEGATIVE NEGATIVE Final   Influenza B by PCR NEGATIVE NEGATIVE Final    Comment: (NOTE) The Xpert Xpress SARS-CoV-2/FLU/RSV plus assay is intended as an aid in the diagnosis of influenza from Nasopharyngeal swab specimens and should not be used as a sole basis for treatment. Nasal washings and aspirates are unacceptable for Xpert Xpress SARS-CoV-2/FLU/RSV testing.  Fact Sheet for Patients: BloggerCourse.com  Fact Sheet for Healthcare Providers: SeriousBroker.it  This test is not yet approved or cleared by the [place] FDA and has been authorized for detection and/or diagnosis of SARS-CoV-2 by FDA under an Emergency Use  Authorization (EUA). This EUA will remain in effect (meaning this test can be used) for  the duration of the COVID-19 declaration under Section 564(b)(1) of the Act, 21 U.S.C. section 360bbb-3(b)(1), unless the authorization is terminated or revoked.     Resp Syncytial Virus by PCR NEGATIVE NEGATIVE Final    Comment: (NOTE) Fact Sheet for Patients: BloggerCourse.com  Fact Sheet for Healthcare Providers: SeriousBroker.it  This test is not yet approved or cleared by the [place] FDA and has been authorized for detection and/or diagnosis of SARS-CoV-2 by FDA under an Emergency Use Authorization (EUA). This EUA will remain in effect (meaning this test can be used) for the duration of the COVID-19 declaration under Section 564(b)(1) of the Act, 21 U.S.C. section 360bbb-3(b)(1), unless the authorization is terminated or revoked.  Performed at [facility], [address], [place], [place] [address]      Medications:    ascorbic acid  500 mg Oral Daily   atenolol  12.5 mg Oral q AM   atorvastatin  40 mg Oral QHS   baclofen  20 mg Oral TID   Chlorhexidine Gluconate Cloth  6 each Topical Daily   DULoxetine  30 mg Oral QHS   enoxaparin (LOVENOX) injection  40 mg Subcutaneous Q24H   ferrous sulfate  325 mg Oral QODAY   furosemide  60 mg Intravenous BID   Gerhardt's butt cream   Topical TID   guaiFENesin  600 mg Oral BID   insulin aspart  0-15 Units Subcutaneous TID WC   insulin glargine-yfgn  24 Units Subcutaneous BID   lamoTRIgine  50 mg Oral BID   leptospermum manuka honey  1 Application Topical Daily   lidocaine  1 patch Transdermal Q24H   nystatin  1 Application Topical Daily   pantoprazole  40 mg Oral Daily   potassium chloride SA  20 mEq Oral Daily   senna-docusate  1 tablet Oral BID   sodium chloride flush  10-40 mL Intracatheter Q12H   traZODone  100 mg Oral QHS   zinc sulfate  220 mg Oral Daily    Continuous Infusions:  piperacillin-tazobactam (ZOSYN)  IV 3.375 g (07/25/23 1750)      LOS: 7 days   Marinda Elk  [facility]  07/26/2023, 7:19 AM

## 2023-07-27 ENCOUNTER — Other Ambulatory Visit: Payer: Self-pay

## 2023-07-27 DIAGNOSIS — F05 Delirium due to known physiological condition: Secondary | ICD-10-CM

## 2023-07-27 DIAGNOSIS — D72829 Elevated white blood cell count, unspecified: Secondary | ICD-10-CM | POA: Diagnosis not present

## 2023-07-27 DIAGNOSIS — J90 Pleural effusion, not elsewhere classified: Secondary | ICD-10-CM | POA: Diagnosis not present

## 2023-07-27 DIAGNOSIS — L89154 Pressure ulcer of sacral region, stage 4: Secondary | ICD-10-CM | POA: Diagnosis not present

## 2023-07-27 LAB — BASIC METABOLIC PANEL
Anion gap: 13 (ref 5–15)
BUN: 12 mg/dL (ref 8–23)
CO2: 26 mmol/L (ref 22–32)
Calcium: 9.1 mg/dL (ref 8.9–10.3)
Chloride: 98 mmol/L (ref 98–111)
Creatinine, Ser: 0.72 mg/dL (ref 0.61–1.24)
GFR, Estimated: >60 mL/min (ref >60)
Glucose, Bld: 152 mg/dL — ABNORMAL HIGH (ref 70–99)
Potassium: 4.3 mmol/L (ref 3.5–5.1)
Sodium: 137 mmol/L (ref 135–145)

## 2023-07-27 LAB — GLUCOSE, CAPILLARY: Glucose-Capillary: 127 mg/dL — ABNORMAL HIGH (ref 70–99)

## 2023-07-27 MED ORDER — OXYCODONE HCL 5 MG PO TABS: 5.0000 mg | ORAL_TABLET | Freq: Two times a day (BID) | ORAL | 0 refills | Status: DC | PRN

## 2023-07-27 MED ORDER — LORAZEPAM 0.5 MG PO TABS: 0.5000 mg | ORAL_TABLET | Freq: Every day | ORAL | 0 refills | Status: DC | PRN

## 2023-07-27 MED ORDER — FERROUS SULFATE 325 (65 FE) MG PO TABS: 325.0000 mg | ORAL_TABLET | ORAL | Status: AC

## 2023-07-27 MED ORDER — INSULIN GLARGINE-YFGN 100 UNIT/ML ~~LOC~~ SOPN: 24.0000 [IU] | PEN_INJECTOR | Freq: Two times a day (BID) | SUBCUTANEOUS | Status: AC

## 2023-07-27 NOTE — Plan of Care (Signed)

## 2023-07-27 NOTE — Discharge Summary (Signed)
Physician Discharge Summary  Brent [name]. ZOX:[id number] DOB: [birth year] DOA: 07/19/2023  PCP: [name], MD  Admit date: 07/19/2023 Discharge date: 07/27/2023  Admitted From: SNF Disposition:  SNF  Recommendations for Outpatient Follow-up:  Follow up with PCP in 1-2 weeks Please obtain BMP/CBC in one week Palliative care to follow the patient at facility try to move towards comfort measures and or move to hospice End date of antibiotics on 08/14/2023.  Home Health:No Equipment/Devices:None  Discharge Condition:Stable CODE STATUS:Full Diet recommendation: Heart Healthy  Brief/Interim Summary:  77 y.o. 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  Then again readmitted and discharged from the hospital on 07/17/2023 secondary to acute diastolic heart failure as per wife he has had intermittent confusion, chest x-ray was done that showed left-sided effusion pulmonary was consulted recommended to continue IV Lasix.  Wife relates he continues to have poor oral intake palliative care was consulted.  Palliative care was consulted the patient remains a full code does not want tracheostomy G-tube long-term artificial support.    Discharge Diagnoses:  Principal Problem:   Acute confusional state Active Problems:   (HFpEF) heart failure with preserved ejection fraction (HCC)   Pleural effusion   History of osteomyelitis   Leukocytosis   Sinus bradycardia   Hyperlipidemia   DEPRESSION, SITUATIONAL, ACUTE    Essential hypertension   Paraplegia (HCC)   Controlled type 2 diabetes mellitus with hyperglycemia, with long-term current use of insulin (HCC)   Transverse myelitis (HCC)   Stage IV pressure ulcer of sacral region [facility])   External gastrointestinal fistula site infection   Normocytic anemia   GERD (gastroesophageal reflux disease)   Bilateral pleural effusion  Goals of care: Plan of care met with family the patient does not want a tracheostomy G-tube for long-term long-term life support. But the family has unrealistic expectations. Plan of care to continue to work with family as an outpatient.  He remains a full code.  Acute confusional state: CT scan showed no acute findings B12 and folate were unremarkable ammonia levels 25. We suspect this is likely due to hospitalization. He has improved clinically will need to follow-up with neurology as an outpatient  Chronic diastolic heart failure: He will continue Lasix at facility Basic metabolic panel to be checked in 1 week.  Sinus bradycardia/transient hypotension: I rate in the 40s and 50 borderline. Atenolol was held. With heart rate started to trend up atenolol was resumed.  SVT: Noted on 11 10/21/2022 continue atenolol.  Poor oral intake: Noted it has improved throughout his hospital stay.  Chest discomfort/shortness of breath/lightheadedness: Likely due to volume overload now resolved.  Leukocytosis: Improved.  History of osteomyelitis of the right fifth metatarsal/septic arthritis of fifth MTP joint: Currently on IV Zosyn for 6 weeks end date of 08/14/2023. PICC line was replaced on 07/27/2023 continue Zosyn.   Diabetes mellitus type 2 with long-term insulin: Last A1c of 7.5.,  Blood glucose trending up. Continue long-acting insulin and metformin as an outpatient.   Essential hypertension: Blood pressure is trending up, continue Lasix and atenolol. He will go to  skilled nursing facility off [address].   Normocytic  anemia: Follow-up with PCP as an outpatient.   Stage IV sacral decubitus ulcer/ischial decubitus ulcer: Continue low air mattress. Wound care recommended: Clean plantar foot intact sutures with NS, apply Xeroform to incision site daily, cover with dry gauze or silicone foam.  Clean Sacral and Ischial wounds with Vashe wound cleanser [name] [phone]), apply Medihoney to wound beds daily, cover with dry gauze.  Apply Gerhardt's Butt Cream to surrounding skin of sacrum/coccyx/buttocks 3 times daily and prn soiling. May cover with ABD pad or silicone foams whichever is preferred. Paraplegia secondary to transverse myelitis/neurogenic bladder with super catheter: Continue baclofen. Suprapubic catheter exchange 07/25/2023.   History of gastrocolic fistula status post colostomy: Continue colostomy care.   Hyperlipidemia: Continue statins.   Depression:  Continue Cymbalta.   Discharge Instructions  Discharge Instructions     Call MD for:  difficulty breathing, headache or visual disturbances   Complete by: As directed    Call MD for:  extreme fatigue   Complete by: As directed    Call MD for:  hives   Complete by: As directed    Call MD for:  persistant dizziness or light-headedness   Complete by: As directed    Call MD for:  persistant nausea and vomiting   Complete by: As directed    Call MD for:  redness, tenderness, or signs of infection (pain, swelling, redness, odor or green/yellow discharge around incision site)   Complete by: As directed    Call MD for:  severe uncontrolled pain   Complete by: As directed    Call MD for:  temperature >100.4   Complete by: As directed    Diet - low sodium heart healthy   Complete by: As directed    Discharge instructions   Complete by: As directed    You were seen for a pleural effusion (fluid between the lung and chest wall).  You also had malaise and slow heart rate and low blood pressure.  We had the lung doctors evaluate the  effusion.  They recommended diuresis (medicines to remove the extra fluid).  At this point they don't recommend any procedures to drain the fluid.  I'll send you home on a higher dose of your lasix.  You should take 40 mg twice a day and repeat your labs within the week to ensure your electrolytes and kidney function remains stable.  You should get another chest x ray in a week or so.  You had delirium which is improved after we started seroquel.  I won't continue this at discharge, but it may be something to discuss if you have recurrent issues.  You were hypotensive (low blood pressure) on admission and also had a slow heart rate.  We've stopped or decreased your blood pressure medications.  You'll need to follow this up with your outpatient providers to see if anything needs to be adjusted.  We'll continue atenolol once a day.    Continue your zosyn at home as scheduled.    I've reduced your home basal dose of insulin, follow this with your pcp outpatient.   You'll need continued wound care for your decubitus ulcers as an outpatient.  Follow this closely with your facility.  We exchanged your suprapubic catheter prior to discharge.  You'll need to follow with your outpatient provider for additional exchanges.    Return for new, recurrent or worsening symptoms.  Please ask your PCP to request records from this hospitalization so  they know what was done and what the next steps will be.   Discharge wound care:   Complete by: As directed    1.  Clean plantar foot intact sutures with NS, apply Xeroform to incision site daily, cover with dry gauze or silicone foam.  2.         Clean Sacrum and Ischial wounds with Vashe wound cleanser [name] [phone]), apply Medihoney to wound beds daily, cover with dry gauze.  Apply Gerhardt's Butt Cream to surrounding skin of sacrum/coccyx/buttocks 3 times daily and prn soiling. May cover with ABD pad or silicone foams whichever is preferred.   Increase activity  slowly   Complete by: As directed       Allergies as of 07/27/2023       Reactions   Cipro [ciprofloxacin Hcl] Other (See Comments)   Hallucinations Allergy not listed on MAR   Neurontin [gabapentin] Other (See Comments)   Unknown reaction   Levaquin [levofloxacin] Rash        Medication List     STOP taking these medications    benzonatate 100 MG capsule Commonly known as: TESSALON   losartan 50 MG tablet Commonly known as: COZAAR   spironolactone 25 MG tablet Commonly known as: ALDACTONE       TAKE these medications    acetaminophen 500 MG tablet Commonly known as: TYLENOL Take 2 tablets (1,000 mg total) by mouth every 6 (six) hours as needed for mild pain (or Fever >/= 101).   ascorbic acid 500 MG tablet Commonly known as: VITAMIN C Take 500 mg by mouth daily.   atenolol 25 MG tablet Commonly known as: TENORMIN Take 12.5 mg by mouth in the morning. What changed: Another medication with the same name was removed. Continue taking this medication, and follow the directions you see here.   atorvastatin 40 MG tablet Commonly known as: LIPITOR Take 1 tablet (40 mg total) by mouth at bedtime.   baclofen 20 MG tablet Commonly known as: LIORESAL Take 1 tablet (20 mg total) by mouth 3 (three) times daily.   Biofreeze 4 % Gel Generic drug: Menthol (Topical Analgesic) Apply 1 application  topically 2 (two) times daily as needed (pain to back (upper and lower),arms and shoulders).   cholecalciferol 25 MCG (1000 UNIT) tablet Commonly known as: VITAMIN D3 Take 2,000 Units by mouth daily.   DULoxetine 30 MG capsule Commonly known as: CYMBALTA Take 1 capsule (30 mg total) by mouth at bedtime.   famotidine 20 MG tablet Commonly known as: PEPCID Take 20 mg by mouth 2 (two) times daily.   ferrous sulfate 325 (65 FE) MG tablet Take 1 tablet (325 mg total) by mouth every other day.   furosemide 40 MG tablet Commonly known as: LASIX Take 1 tablet (40 mg  total) by mouth daily.   GenTeal Tears Severe Day/Night 0.4-0.3 % Gel ophthalmic gel Generic drug: Polyethyl Glycol-Propyl Glycol Place 1 Application into both eyes 4 (four) times daily as needed (dry eyes).   guaiFENesin 600 MG 12 hr tablet Commonly known as: MUCINEX Take 600 mg by mouth 2 (two) times daily.   guaiFENesin-dextromethorphan 100-10 MG/5ML syrup Commonly known as: ROBITUSSIN DM Take 10 mLs by mouth every 6 (six) hours as needed for cough.   insulin aspart 100 UNIT/ML injection Commonly known as: novoLOG Inject 0-15 Units into the skin every 4 (four) hours. Sliding scale  CBG 70 - 120: 0 units: CBG 121 - 150: 2 units; CBG 151 - 200: 3 units;  CBG 201 - 250: 5 units; CBG 251 - 300: 8 units;CBG 301 - 350: 11 units; CBG 351 - 400: 15 units; CBG > 400 : 15 units and notify MD What changed:  when to take this additional instructions   insulin glargine-yfgn 100 UNIT/ML Pen Commonly known as: Semglee (yfgn) Inject 24 Units into the skin 2 (two) times daily. What changed: how much to take   lamoTRIgine 25 MG tablet Commonly known as: LAMICTAL Take 2 tablets (50 mg total) by mouth 2 (two) times daily. For 5 days. Started 02/18/23.   LORazepam 0.5 MG tablet Commonly known as: ATIVAN Take 1 tablet (0.5 mg total) by mouth daily as needed (panic attacks).   metFORMIN 1000 MG tablet Commonly known as: GLUCOPHAGE Take 1,000 mg by mouth in the morning and at bedtime.   nystatin powder Apply 1 Application topically daily. Apply to peri-wound bed, sacrum and right ischium daily with routine dressing.   omeprazole 40 MG capsule Commonly known as: PRILOSEC Take 1 capsule (40 mg total) by mouth 2 (two) times daily. 30 minutes before breakfast/dinner   ondansetron 4 MG disintegrating tablet Commonly known as: ZOFRAN-ODT Take 4 mg by mouth every 8 (eight) hours as needed for nausea or vomiting.   oxyCODONE 5 MG immediate release tablet Commonly known as: Oxy IR/ROXICODONE Take  1 tablet (5 mg total) by mouth 2 (two) times daily as needed for breakthrough pain.   piperacillin-tazobactam IVPB Commonly known as: ZOSYN Inject 3.375 g into the vein every 6 (six) hours. Indication: diabetic foot infection/osteomyelitis  First Dose: Yes Last Day of Therapy: 08/15/2023 Labs - Once weekly:  CBC/D and BMP, Labs - Once weekly: ESR and CRP   polyethylene glycol 17 g packet Commonly known as: MIRALAX / GLYCOLAX Take 17 g by mouth daily as needed for mild constipation.   potassium chloride SA 20 MEQ tablet Commonly known as: KLOR-CON M Take 1 tablet (20 mEq total) by mouth daily. Take along with Lasix   RITUXAN IV Inject 10 mg into the vein once. Between the 12th-13th of June, December.   senna-docusate 8.6-50 MG tablet Commonly known as: Senokot-S Take 1 tablet by mouth 2 (two) times daily.   traZODone 100 MG tablet Commonly known as: DESYREL Take 100 mg by mouth at bedtime.   zinc sulfate 220 (50 Zn) MG capsule Take 1 capsule (220 mg total) by mouth daily.               Discharge Care Instructions  (From admission, onward)           Start     Ordered   07/27/23 0000  Discharge wound care:       Comments: 1.  Clean plantar foot intact sutures with NS, apply Xeroform to incision site daily, cover with dry gauze or silicone foam.  2.         Clean Sacrum and Ischial wounds with Vashe wound cleanser [name] [phone]), apply Medihoney to wound beds daily, cover with dry gauze.  Apply Gerhardt's Butt Cream to surrounding skin of sacrum/coccyx/buttocks 3 times daily and prn soiling. May cover with ABD pad or silicone foams whichever is preferred.   07/27/23 0840            Allergies  Allergen Reactions   Cipro [Ciprofloxacin Hcl] Other (See Comments)    Hallucinations Allergy not listed on MAR   Neurontin [Gabapentin] Other (See Comments)    Unknown reaction   Levaquin [Levofloxacin] Rash    Consultations:  Pulmonary and critical  care   Procedures/Studies: [place] EKG SITE RITE  Result Date: 07/27/2023 If Site Rite image not attached, placement could not be confirmed due to current cardiac rhythm.  [place] EKG SITE RITE  Result Date: 07/27/2023 If Site Rite image not attached, placement could not be confirmed due to current cardiac rhythm.  DG CHEST PORT 1 VIEW  Result Date: 07/26/2023 CLINICAL DATA:  Pleural effusion EXAM: PORTABLE CHEST 1 VIEW COMPARISON:  Chest x-ray dated July 24, 2023 FINDINGS: Unchanged enlarged cardiac and mediastinal contours. Stable position of right arm PICC. Unchanged diffuse interstitial opacities. Small left pleural effusion. IMPRESSION: 1. Unchanged mild diffuse interstitial opacities, likely due to pulmonary edema. 2. Small left pleural effusion. Electronically Signed   By: Allegra Lai M.D.   On: 07/26/2023 13:25   DG CHEST PORT 1 VIEW  Result Date: 07/24/2023 CLINICAL DATA:  142230 Pleural effusion 242230 EXAM: PORTABLE CHEST 1 VIEW COMPARISON:  CXR 07/22/23 FINDINGS: Right arm PICC with the tip at the cavoatrial junction. Layering left-sided pleural effusion, slightly increased from prior exam. Cardiomegaly. There are prominent bilateral interstitial opacities, favored to represent mild pulmonary edema. No radiographically apparent displaced rib fractures. Visualized upper abdomen is notable for surgical clips in the right upper quadrant. IMPRESSION: 1. Layering left-sided pleural effusion, slightly increased from prior exam. 2. Cardiomegaly with mild pulmonary edema. Electronically Signed   By: Lorenza Cambridge M.D.   On: 07/24/2023 12:18   DG CHEST PORT 1 VIEW  Result Date: 07/22/2023 CLINICAL DATA:  Pleural effusion. EXAM: PORTABLE CHEST 1 VIEW COMPARISON:  Radiographs 07/20/2023 and 07/19/2023.  CT 07/19/2023. FINDINGS: 0719 hours. Stable cardiomegaly and vascular congestion. Unchanged left pleural effusion and probable left greater than right basilar atelectasis. No significant residual  right pleural effusion. No pneumothorax. The bones appear unchanged. Telemetry leads overlie the chest. IMPRESSION: No significant residual right pleural effusion. Unchanged left pleural effusion and probable left greater than right basilar atelectasis. Electronically Signed   By: Carey Bullocks M.D.   On: 07/22/2023 11:41   [place] EKG SITE RITE  Result Date: 07/21/2023 If Site Rite image not attached, placement could not be confirmed due to current cardiac rhythm.  DG CHEST PORT 1 VIEW  Result Date: 07/20/2023 CLINICAL DATA:  Shortness of breath. EXAM: PORTABLE CHEST 1 VIEW COMPARISON:  Chest x-ray 10/30/4 FINDINGS: The heart is enlarged. Left pleural effusion basilar airspace disease is again noted. Aeration of both lungs is improved with some residual mild pulmonary vascular congestion. Right-sided PICC line is stable. IMPRESSION: 1. Improved aeration of both lungs with some residual mild pulmonary vascular congestion. 2. Persistent left pleural effusion and basilar airspace disease. Electronically Signed   By: Marin Roberts M.D.   On: 07/20/2023 17:02   CT ABDOMEN PELVIS W CONTRAST  Result Date: 07/19/2023 CLINICAL DATA:  Abdominal pain, acute, nonlocalized. EXAM: CT ABDOMEN AND PELVIS WITH CONTRAST TECHNIQUE: Multidetector CT imaging of the abdomen and pelvis was performed using the standard protocol following bolus administration of intravenous contrast. RADIATION DOSE REDUCTION: This exam was performed according to the departmental dose-optimization program which includes automated exposure control, adjustment of the mA and/or kV according to patient size and/or use of iterative reconstruction technique. CONTRAST:  75mL OMNIPAQUE IOHEXOL 350 MG/ML SOLN COMPARISON:  CT abdomen/pelvis dated 06/30/2023 FINDINGS: Lower chest: Please refer to the same-day CT chest for description of intrathoracic findings. Hepatobiliary: No focal liver abnormality is seen. Status post cholecystectomy. No biliary  dilatation. Pancreas: Unremarkable. No pancreatic ductal dilatation or surrounding  inflammatory changes. Spleen: Normal in size without focal abnormality. Adrenals/Urinary Tract: Adrenal glands are unremarkable. Stable appearance of the kidneys including a densely calcified cyst at the superior pole of the left kidney, for which no follow-up imaging is recommended. No renal calculi or hydronephrosis. The bladder is decompressed with a suprapubic catheter in place. Stomach/Bowel: Stable postoperative changes of the stomach related to prior gastrocolic fistula repair at the level of the gastric body. No evidence of recurrent fistula. The appendix is normal. Stable left lower quadrant colostomy. No evidence of obstruction. Colonic diverticulosis without evidence of acute diverticulitis. No abdominopelvic ascites. No intraperitoneal free air. Vascular/Lymphatic: Aortic atherosclerosis. No enlarged abdominal or pelvic lymph nodes. Reproductive: Prostate is unremarkable. Other: Similar appearance of a right posterior ischial decubitus ulcer which extends to the level of the right ischial tuberosity with surrounding soft tissue thickening. Chronic appearing sclerotic changes of the underlying right ischial tuberosity without definite areas of acute osteolysis identified. Similar cutaneous thickening overlying the sacrum and left ischial tuberosity. Postoperative changes along the ventral abdominal wall. Musculoskeletal: No acute osseous abnormality. Stable fusion of the L4-L5 vertebral bodies. IMPRESSION: 1. No acute localizing findings in the abdomen or pelvis. 2. Stable postoperative changes of the stomach related to prior gastrocolic fistula repair without evidence of recurrent fistula. 3. Similar appearance of a right posterior ischial decubitus ulcer with chronic sclerotic changes of the underlying right ischial tuberosity. No definite acute osteolysis is identified. 4. Stable left lower quadrant colostomy. 5. Stable  suprapubic catheter. 6.  Aortic Atherosclerosis (ICD10-I70.0). 7. Additional unchanged ancillary findings as described above. Electronically Signed   By: Hart Robinsons M.D.   On: 07/19/2023 13:22   CT Angio Chest PE W and/or Wo Contrast  Result Date: 07/19/2023 CLINICAL DATA:  Pulmonary embolism (PE) suspected, high prob. Weakness. Bradycardia. EXAM: CT ANGIOGRAPHY CHEST WITH CONTRAST TECHNIQUE: Multidetector CT imaging of the chest was performed using the standard protocol during bolus administration of intravenous contrast. Multiplanar CT image reconstructions and MIPs were obtained to evaluate the vascular anatomy. RADIATION DOSE REDUCTION: This exam was performed according to the departmental dose-optimization program which includes automated exposure control, adjustment of the mA and/or kV according to patient size and/or use of iterative reconstruction technique. CONTRAST:  75mL OMNIPAQUE IOHEXOL 350 MG/ML SOLN COMPARISON:  CTA chest dated November 01, 2021. FINDINGS: Cardiovascular: Satisfactory opacification of the pulmonary arteries to the segmental level. No evidence of pulmonary embolism. Heart size is borderline enlarged. Multivessel coronary artery calcifications. Atherosclerotic calcifications of the thoracic aorta and arch branch vessels. There is a small pericardial effusion. Right-sided PICC tip at the level of the superior cavoatrial junction. Mediastinum/Nodes: No enlarged mediastinal, hilar, or axillary lymph nodes. Thyroid gland, trachea, and esophagus demonstrate no significant findings. Lungs/Pleura: Moderate-sized left-greater-than-right pleural effusions with basilar atelectasis. Consolidative opacities in the posterior left lower lung base. No pneumothorax. Upper Abdomen: Please refer to the same-day CT abdomen/pelvis for description of intra-abdominal findings. Musculoskeletal: No acute osseous abnormality. No suspicious osseous lesion. Review of the MIP images confirms the above  findings. IMPRESSION: 1. No evidence of acute pulmonary embolism. 2. Moderate-sized left-greater-than-right pleural effusions with basilar atelectasis. Consolidative opacities in the posterior left lower lung base may represent atelectasis, however, an underlying infiltrate or mass can not be excluded. 3. Small pericardial effusion. 4.  Aortic Atherosclerosis (ICD10-I70.0). Electronically Signed   By: Hart Robinsons M.D.   On: 07/19/2023 12:57   CT HEAD WO CONTRAST  Result Date: 07/19/2023 CLINICAL DATA:  Provided history: Mental status change,  unknown cause. Polytrauma, blunt. Additional history provided: Generalized weakness, bradycardia, neck pain, headache, nausea, recent sepsis. EXAM: CT HEAD WITHOUT CONTRAST CT CERVICAL SPINE WITHOUT CONTRAST TECHNIQUE: Multidetector CT imaging of the head and cervical spine was performed following the standard protocol without intravenous contrast. Multiplanar CT image reconstructions of the cervical spine were also generated. RADIATION DOSE REDUCTION: This exam was performed according to the departmental dose-optimization program which includes automated exposure control, adjustment of the mA and/or kV according to patient size and/or use of iterative reconstruction technique. COMPARISON:  Head CT 03/07/2023.  Cervical spine MRI 05/25/2020. FINDINGS: CT HEAD FINDINGS Brain: Generalized cerebral atrophy. Prominence perivascular spaces within the inferior basal ganglia, bilaterally. Apparent prominence of the retrocerebellar CSF space at midline and to the right, unchanged from the prior head CT of 03/07/2023. This may reflect a mega cisterna magna or an arachnoid cyst. There is no acute intracranial hemorrhage. No demarcated cortical infarct. No evidence of an intracranial mass. No midline shift. Vascular: No hyperdense vessel.  Atherosclerotic calcifications. Skull: No calvarial fracture or aggressive osseous lesion. Sinuses/Orbits: No mass or acute finding within the  imaged orbits. Trace mucosal thickening within the right maxillary and bilateral ethmoid sinuses at the imaged levels. Other: Trace fluid within the right mastoid air cells. CT CERVICAL SPINE FINDINGS Alignment: No significant spondylolisthesis. Skull base and vertebrae: The basion-dental and atlanto-dental intervals are maintained.No evidence of acute fracture to the cervical spine. Soft tissues and spinal canal: No prevertebral fluid or swelling. No visible canal hematoma. Disc levels: Cervical spondylosis with multilevel disc space narrowing, disc bulges/central disc protrusions, uncovertebral hypertrophy and facet arthrosis. Disc space narrowing is greatest at C6-C7 and C7-T1 (moderate to advanced at these levels). Posterior disc osteophyte complex at C5-C6. Mild ossification of the posterior longitudinal ligament at the C2 level. No appreciable high-grade spinal canal stenosis. Multilevel bony neural foraminal narrowing. Multilevel ventral osteophytes, most prominent at C5-C6 and C6-C7. Upper chest: Partially imaged bilateral pleural effusions. Bilaterally. IMPRESSION: CT head: 1. No evidence of an acute intracranial abnormality. 2. Cerebral atrophy. CT cervical spine: 1. No evidence of an acute fracture to the cervical spine. 2. Cervical spondylosis and ossification of the posterior longitudinal ligament as described. 3. Partially imaged bilateral pleural effusions. Electronically Signed   By: Jackey Loge D.O.   On: 07/19/2023 12:38   CT CERVICAL SPINE WO CONTRAST  Result Date: 07/19/2023 CLINICAL DATA:  Provided history: Mental status change, unknown cause. Polytrauma, blunt. Additional history provided: Generalized weakness, bradycardia, neck pain, headache, nausea, recent sepsis. EXAM: CT HEAD WITHOUT CONTRAST CT CERVICAL SPINE WITHOUT CONTRAST TECHNIQUE: Multidetector CT imaging of the head and cervical spine was performed following the standard protocol without intravenous contrast. Multiplanar CT  image reconstructions of the cervical spine were also generated. RADIATION DOSE REDUCTION: This exam was performed according to the departmental dose-optimization program which includes automated exposure control, adjustment of the mA and/or kV according to patient size and/or use of iterative reconstruction technique. COMPARISON:  Head CT 03/07/2023.  Cervical spine MRI 05/25/2020. FINDINGS: CT HEAD FINDINGS Brain: Generalized cerebral atrophy. Prominence perivascular spaces within the inferior basal ganglia, bilaterally. Apparent prominence of the retrocerebellar CSF space at midline and to the right, unchanged from the prior head CT of 03/07/2023. This may reflect a mega cisterna magna or an arachnoid cyst. There is no acute intracranial hemorrhage. No demarcated cortical infarct. No evidence of an intracranial mass. No midline shift. Vascular: No hyperdense vessel.  Atherosclerotic calcifications. Skull: No calvarial fracture or aggressive osseous lesion.  Sinuses/Orbits: No mass or acute finding within the imaged orbits. Trace mucosal thickening within the right maxillary and bilateral ethmoid sinuses at the imaged levels. Other: Trace fluid within the right mastoid air cells. CT CERVICAL SPINE FINDINGS Alignment: No significant spondylolisthesis. Skull base and vertebrae: The basion-dental and atlanto-dental intervals are maintained.No evidence of acute fracture to the cervical spine. Soft tissues and spinal canal: No prevertebral fluid or swelling. No visible canal hematoma. Disc levels: Cervical spondylosis with multilevel disc space narrowing, disc bulges/central disc protrusions, uncovertebral hypertrophy and facet arthrosis. Disc space narrowing is greatest at C6-C7 and C7-T1 (moderate to advanced at these levels). Posterior disc osteophyte complex at C5-C6. Mild ossification of the posterior longitudinal ligament at the C2 level. No appreciable high-grade spinal canal stenosis. Multilevel bony neural  foraminal narrowing. Multilevel ventral osteophytes, most prominent at C5-C6 and C6-C7. Upper chest: Partially imaged bilateral pleural effusions. Bilaterally. IMPRESSION: CT head: 1. No evidence of an acute intracranial abnormality. 2. Cerebral atrophy. CT cervical spine: 1. No evidence of an acute fracture to the cervical spine. 2. Cervical spondylosis and ossification of the posterior longitudinal ligament as described. 3. Partially imaged bilateral pleural effusions. Electronically Signed   By: Jackey Loge D.O.   On: 07/19/2023 12:38   DG Chest Port 1 View  Result Date: 07/19/2023 CLINICAL DATA:  Altered mental status EXAM: PORTABLE CHEST 1 VIEW COMPARISON:  07/15/2023 x-ray FINDINGS: Right-sided PICC in place with tip at the SVC right atrial junction. Overlapping cardiac leads. Stable cardiopericardial silhouette with some vascular congestion and interstitial changes. Small effusions. No pneumothorax. Degenerative changes. Calcified aorta. IMPRESSION: No significant interval change compared to previous. Stable appearance of the right-sided PICC Electronically Signed   By: Karen Kays M.D.   On: 07/19/2023 10:06   ECHOCARDIOGRAM COMPLETE  Result Date: 07/15/2023    ECHOCARDIOGRAM REPORT   Patient Name:   Brent [name]. Date of Exam: 07/15/2023 Medical Rec #:  [id number]           Height:       72.0 in Accession #:    [id number]          Weight:       223.3 lb Date of Birth:  09-21-1945          BSA:          2.233 m Patient Age:    1 years            BP:           133/93 mmHg Patient Gender: M                   HR:           53 bpm. Exam Location:  Inpatient Procedure: 2D Echo, Color Doppler and Cardiac Doppler Indications:    CHF  History:        Patient has prior history of Echocardiogram examinations, most                 recent 03/12/2020. Risk Factors:Dyslipidemia, Hypertension and                 Diabetes.  Sonographer:    Milbert Coulter Referring Phys: [id number] RONDELL A SMITH IMPRESSIONS   1. Left ventricular ejection fraction, by estimation, is 60 to 65%. The left ventricle has normal function. The left ventricle has no regional wall motion abnormalities. There is mild left ventricular hypertrophy. Left ventricular diastolic parameters were normal.  2. Right ventricular systolic  function is normal. The right ventricular size is normal. Tricuspid regurgitation signal is inadequate for assessing PA pressure.  3. A small pericardial effusion is present.  4. The mitral valve is normal in structure. Trivial mitral valve regurgitation. No evidence of mitral stenosis.  5. The aortic valve is tricuspid. Aortic valve regurgitation is not visualized. Aortic valve sclerosis is present, with no evidence of aortic valve stenosis.  6. The inferior vena cava is dilated in size with >50% respiratory variability, suggesting right atrial pressure of 8 mmHg. FINDINGS  Left Ventricle: Left ventricular ejection fraction, by estimation, is 60 to 65%. The left ventricle has normal function. The left ventricle has no regional wall motion abnormalities. The left ventricular internal cavity size was normal in size. There is  mild left ventricular hypertrophy. Left ventricular diastolic parameters were normal. Right Ventricle: The right ventricular size is normal. No increase in right ventricular wall thickness. Right ventricular systolic function is normal. Tricuspid regurgitation signal is inadequate for assessing PA pressure. Left Atrium: Left atrial size was normal in size. Right Atrium: Right atrial size was normal in size. Pericardium: A small pericardial effusion is present. Presence of epicardial fat layer. Mitral Valve: The mitral valve is normal in structure. Trivial mitral valve regurgitation. No evidence of mitral valve stenosis. Tricuspid Valve: The tricuspid valve is normal in structure. Tricuspid valve regurgitation is trivial. Aortic Valve: The aortic valve is tricuspid. Aortic valve regurgitation is not  visualized. Aortic valve sclerosis is present, with no evidence of aortic valve stenosis. Aortic valve mean gradient measures 4.0 mmHg. Aortic valve peak gradient measures 8.5  mmHg. Aortic valve area, by VTI measures 2.22 cm. Pulmonic Valve: The pulmonic valve was not well visualized. Pulmonic valve regurgitation is not visualized. Aorta: The aortic root and ascending aorta are structurally normal, with no evidence of dilitation. Venous: The inferior vena cava is dilated in size with greater than 50% respiratory variability, suggesting right atrial pressure of 8 mmHg. IAS/Shunts: The interatrial septum was not well visualized.  LEFT VENTRICLE PLAX 2D LVIDd:         5.10 cm   Diastology LVIDs:         3.20 cm   LV e' medial:    8.27 cm/s LV PW:         1.10 cm   LV E/e' medial:  7.2 LV IVS:        1.10 cm   LV e' lateral:   9.36 cm/s LVOT diam:     2.10 cm   LV E/e' lateral: 6.4 LV SV:         75 LV SV Index:   34 LVOT Area:     3.46 cm  RIGHT VENTRICLE RV Basal diam:  3.40 cm RV Mid diam:    2.40 cm RV S prime:     12.20 cm/s TAPSE (M-mode): 1.9 cm LEFT ATRIUM             Index        RIGHT ATRIUM           Index LA diam:        2.80 cm 1.25 cm/m   RA Area:     15.00 cm LA Vol (A2C):   65.3 ml 29.25 ml/m  RA Volume:   35.00 ml  15.68 ml/m LA Vol (A4C):   64.7 ml 28.98 ml/m LA Biplane Vol: 66.6 ml 29.83 ml/m  AORTIC VALVE AV Area (Vmax):    2.21 cm AV Area (Vmean):  2.20 cm AV Area (VTI):     2.22 cm AV Vmax:           146.00 cm/s AV Vmean:          96.300 cm/s AV VTI:            0.337 m AV Peak Grad:      8.5 mmHg AV Mean Grad:      4.0 mmHg LVOT Vmax:         93.30 cm/s LVOT Vmean:        61.300 cm/s LVOT VTI:          0.216 m LVOT/AV VTI ratio: 0.64  AORTA Ao Root diam: 3.40 cm Ao Asc diam:  3.20 cm MITRAL VALVE MV Area (PHT): 3.93 cm    SHUNTS MV Decel Time: 193 msec    Systemic VTI:  0.22 m MV E velocity: 59.60 cm/s  Systemic Diam: 2.10 cm MV A velocity: 69.30 cm/s MV E/A ratio:  0.86 Epifanio Lesches MD Electronically signed by Epifanio Lesches MD Signature Date/Time: 07/15/2023/2:04:49 PM    Final    DG CHEST PORT 1 VIEW  Result Date: 07/15/2023 CLINICAL DATA:  77 year old male with history of shortness of breath. EXAM: PORTABLE CHEST 1 VIEW COMPARISON:  Chest x-ray 07/14/2023. FINDINGS: There is a right upper extremity PICC with tip terminating in the superior cavoatrial junction. Lung volumes are low. Bibasilar opacities which may reflect areas of atelectasis and/or consolidation. Moderate bilateral pleural effusions. No pneumothorax. No evidence of pulmonary edema. Diffuse interstitial prominence and peribronchial cuffing. Heart size is mildly enlarged. The patient is rotated to the left on today's exam, resulting in distortion of the mediastinal contours and reduced diagnostic sensitivity and specificity for mediastinal pathology. Atherosclerotic calcifications are noted in the thoracic aorta. IMPRESSION: 1. Support apparatus, as above. 2. Persistent moderate bilateral pleural effusions with bibasilar areas of atelectasis and/or consolidation. 3. Cardiomegaly. 4. Aortic atherosclerosis. Electronically Signed   By: Trudie Reed M.D.   On: 07/15/2023 07:37   DG Chest Port 1 View  Result Date: 07/14/2023 CLINICAL DATA:  Cough and dyspnea EXAM: PORTABLE CHEST 1 VIEW COMPARISON:  07/04/2023 x-ray FINDINGS: Film is rotated to the left. Enlarged cardiopericardial silhouette with calcified aorta. There are some vascular congestion. Small pleural effusions with the adjacent opacities. No pneumothorax. Right-sided PICC with the tip along the central SVC. Overlapping cardiac leads. Degenerative changes of the spine. IMPRESSION: Enlarged cardiopericardial silhouette with vascular congestion. Increasing small pleural effusions. Adjacent opacities. Recommend follow-up. Right-sided PICC Electronically Signed   By: Karen Kays M.D.   On: 07/14/2023 13:05   [place] EKG SITE RITE  Result Date:  07/06/2023 If Site Rite image not attached, placement could not be confirmed due to current cardiac rhythm.  DG Shoulder Left Port  Result Date: 07/04/2023 CLINICAL DATA:  Left shoulder pain EXAM: LEFT SHOULDER COMPARISON:  None Available. FINDINGS: There is no evidence of fracture or dislocation. Mild-moderate osteoarthritic changes of the glenohumeral and acromioclavicular joints. Soft tissues are unremarkable. 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 No acute findings. Electronically Signed   By: Duanne Guess D.O.   On: 07/04/2023 15:34   DG Chest 1 View  Result Date: 07/04/2023 CLINICAL DATA:  141880 SOB (shortness of breath) 141880 [id number] Left shoulder pain 242339 92568 Pleuritic pain 52841 EXAM: CHEST  1 VIEW COMPARISON:  CXR 05/27/23 FINDINGS: Cardiomegaly. Likely a small left pleural effusion. There is a hazy opacity at the left lung base could represent atelectasis  or infection. No radiographically apparent displaced rib fracture. Visualized upper abdomen unremarkable. IMPRESSION: 1. Hazy opacity at the left lung base could represent atelectasis or infection. 2. Likely a small left pleural effusion. Electronically Signed   By: Lorenza Cambridge M.D.   On: 07/04/2023 14:25   VAS [place] ABI WITH/WO TBI  Result Date: 07/03/2023  LOWER EXTREMITY DOPPLER STUDY Patient Name:  Brent COUTTS.  Date of Exam:   07/03/2023 Medical Rec #: [id number]            Accession #:    [id number] Date of Birth: 09/02/1946           Patient Gender: M Patient Age:   28 years Exam Location:  [facility] Procedure:      VAS [place] ABI WITH/WO TBI Referring Phys: Brendia Sacks --------------------------------------------------------------------------------  Indications: Osteophyte, right foot [[id number]] High Risk Factors: Hypertension, hyperlipidemia, Diabetes.  Vascular Interventions: 07/02/2023 - Right AMPUTATION TOE FIFTH RAY. Limitations: Today's exam was limited due to an open  wound and bandages. Comparison Study: 03/08/2020 - Right: Resting right ankle-brachial index is                   within normal range. No                   evidence of significant right lower extremity arterial                   disease. The right                   toe-brachial index is normal.                    Left: Resting left ankle-brachial index is within normal                   range. No                   evidence of significant left lower extremity arterial disease.                   The left                   toe-brachial index is normal. Performing Technologist: Olen Cordial RVT  Examination Guidelines: A complete evaluation includes at minimum, Doppler waveform signals and systolic blood pressure reading at the level of bilateral brachial, anterior tibial, and posterior tibial arteries, when vessel segments are accessible. Bilateral testing is considered an integral part of a complete examination. Photoelectric Plethysmograph (PPG) waveforms and toe systolic pressure readings are included as required and additional duplex testing as needed. Limited examinations for reoccurring indications may be performed as noted.  ABI Findings: +---------+------------------+-----+----------+--------+ Right    Rt Pressure (mmHg)IndexWaveform  Comment  +---------+------------------+-----+----------+--------+ Brachial 157                    triphasic          +---------+------------------+-----+----------+--------+ PTA      149               0.95 monophasic         +---------+------------------+-----+----------+--------+ DP       151               0.96 monophasic         +---------+------------------+-----+----------+--------+ Shawna Clamp  0.48                    +---------+------------------+-----+----------+--------+ +---------+------------------+-----+-----------+-------+ Left     Lt Pressure (mmHg)IndexWaveform   Comment  +---------+------------------+-----+-----------+-------+ Brachial 144                    triphasic          +---------+------------------+-----+-----------+-------+ PTA      158               1.01 multiphasic        +---------+------------------+-----+-----------+-------+ DP       167               1.06 multiphasic        +---------+------------------+-----+-----------+-------+ Great Toe74                0.47                    +---------+------------------+-----+-----------+-------+ +-------+-----------+-----------+------------+------------+ ABI/TBIToday's ABIToday's TBIPrevious ABIPrevious TBI +-------+-----------+-----------+------------+------------+ Right  0.96       0.48       1.2         0.76         +-------+-----------+-----------+------------+------------+ Left   1.06       0.47       1.2         0.71         +-------+-----------+-----------+------------+------------+  Summary: Right: Resting right ankle-brachial index is within normal range, however the waveform is monophasic which can occur with proximal stenosis. The right toe-brachial index is abnormal. Left: Resting left ankle-brachial index is within normal range. The left toe-brachial index is abnormal. ABI's are likely falsely elevated due to medial calcification.  *See table(s) above for measurements and observations.  Electronically signed by Gerarda Fraction on 07/03/2023 at 10:56:56 AM.    Final    MR FOOT RIGHT W WO CONTRAST  Result Date: 07/01/2023 CLINICAL DATA:  Right foot ulcer. EXAM: MRI OF THE RIGHT FOREFOOT WITHOUT AND WITH CONTRAST TECHNIQUE: Multiplanar, multisequence MR imaging of the right foot was performed before and after the administration of intravenous contrast. CONTRAST:  10mL MAGNEVIST GADOPENTETATE DIMEGLUMINE 469.01 MG/ML IV SOLN COMPARISON:  Right foot x-rays from same day. FINDINGS: Bones/Joint/Cartilage Abnormal marrow edema and enhancement with corresponding decreased T1  marrow signal and associated bony destruction involving the distal half of the fifth metatarsal, as well as the fifth proximal phalanx. Portions of the fifth metatarsal do not enhance, consistent with osteonecrosis. Remaining marrow signal is normal. No fracture or dislocation. Mild degenerative changes. Large fifth MTP joint effusion. Ligaments The lateral collateral ligament of the fifth MTP joint is torn. Remaining toe collateral ligaments are intact. Intact Lisfranc ligament. Medial and lateral ankle ligaments are grossly intact. Muscles and Tendons No tenosynovitis. Increased T2 signal within and atrophy of the intrinsic muscles of the forefoot, nonspecific, but likely related to diabetic muscle changes. Soft tissue Lateral plantar foot ulcer at the base of the fifth metatarsal head, extending to bone. Small foci of adjacent subcutaneous emphysema. No rim enhancing fluid collection. Prominent dorsal foot soft tissue swelling without enhancement. No soft tissue mass. IMPRESSION: 1. Lateral plantar foot ulcer at the base of the fifth metatarsal head extending to bone, with underlying fifth MTP joint septic arthritis and osteomyelitis of the fifth metatarsal and fifth proximal phalanx. 2. Portions of the infected fifth metatarsal do not enhance, consistent with osteonecrosis. 3. No abscess. Electronically Signed   By: Teresita Madura  Derry M.D.   On: 07/01/2023 09:52   CT ABDOMEN PELVIS W CONTRAST  Result Date: 06/30/2023 CLINICAL DATA:  Sepsis. History of sacral and right ischial decubitus ulcers. 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 EXAM: CT ABDOMEN AND PELVIS WITH CONTRAST TECHNIQUE: Multidetector CT imaging of the abdomen and pelvis was performed using the standard protocol following bolus administration of intravenous contrast. RADIATION DOSE REDUCTION: This exam was performed according to the departmental dose-optimization program which includes automated exposure control,  adjustment of the mA and/or kV according to patient size and/or use of iterative reconstruction technique. CONTRAST:  OMNIPAQUE IOHEXOL 300 MG/ML  SOLN COMPARISON:  06/05/2023 FINDINGS: Lower chest: Stable bibasilar scarring. Hepatobiliary: No focal liver abnormality is seen. Status post cholecystectomy. No biliary dilatation. Pancreas: Unremarkable. No pancreatic ductal dilatation or surrounding inflammatory changes. Spleen: Normal in size without focal abnormality. Adrenals/Urinary Tract: Adrenal glands are unremarkable. Stable appearance of kidneys including stable densely calcified cyst of the upper pole of the left kidney and benign Bosniak 1 cyst of the medial left kidney. The bladder is decompressed by a stable suprapubic catheter. Stomach/Bowel: No bowel obstruction or ileus. Stable appearance of left lower quadrant colostomy. Stable appearance of stomach after repaired gastrocolic fistula at the level of the body of the stomach without evidence of recurrent fistula. No free intraperitoneal air. The appendix is normal. Vascular/Lymphatic: Stable atherosclerosis of the abdominal aorta and iliac arteries without aneurysm. No lymphadenopathy identified. Reproductive: Prostate is unremarkable. Other: Stable right-sided posterior ischial decubitus ulcer that nearly extends to the bony surface of the posterior ischial tuberosity. Chronic sclerosis of the ischial tuberosity. No fluid collections. Minimal skin thickening overlying the sacrum without significant ulceration. Musculoskeletal: No fractures. Stable fused appearance of the L4 and L5 vertebral bodies. IMPRESSION: 1. No acute findings in the abdomen or pelvis. 2. Stable appearance of the stomach after repaired gastrocolic fistula at the level of the body of the stomach without evidence of recurrent fistula. 3. Stable right-sided posterior ischial decubitus ulcer that nearly extends to the bony surface of the posterior ischial tuberosity. Chronic  sclerosis of the ischial tuberosity. No fluid collections. 4. Minimal skin thickening overlying the sacrum without significant ulceration. 5. Stable appearance of left lower quadrant colostomy. 6. Stable suprapubic catheter. 7. Stable densely calcified cyst of the upper pole of the left kidney and benign Bosniak 1 cyst of the medial left kidney. 8. Aortic atherosclerosis. Aortic Atherosclerosis (ICD10-I70.0). Electronically Signed   By: Irish Lack M.D.   On: 06/30/2023 16:04   DG Foot Complete Right  Result Date: 06/30/2023 CLINICAL DATA:  Plantar foot infection EXAM: RIGHT FOOT COMPLETE - 4 VIEW COMPARISON:  None Available. FINDINGS: Extensive soft tissue swelling about the foot. Tiny plantar and Achilles calcaneal spurs. Severe osteopenia. There is some soft tissue gas seen about the fifth digit. There is underlying bony erosive changes involving the head of fifth metatarsal. With patient's provided history and appearance an area of osteomyelitis is possible. No fracture or dislocation. IMPRESSION: Severe osteopenia and soft tissue swelling with soft tissue gas and thickening about the fifth digit near the fifth metatarsophalangeal joint with underlying erosive changes suggested along the fifth metatarsal head. An area of bone infection is possible. Further workup as clinically appropriate such as MRI or bone scan to further delineate Electronically Signed   By: Karen Kays M.D.   On: 06/30/2023 15:02   (Echo, Carotid, EGD, Colonoscopy, ERCP)    Subjective: No complaints  Discharge Exam: Vitals:  07/26/23 1956 07/27/23 0322  BP: (!) 134/56 (!) 138/53  Pulse: 74 70  Resp: 16 16  Temp: 98.3 F (36.8 C) 98.3 F (36.8 C)  SpO2:  96%   Vitals:   07/26/23 1209 07/26/23 1556 07/26/23 1956 07/27/23 0322  BP: (!) 120/57 (!) 149/68 (!) 134/56 (!) 138/53  Pulse: 63 82 74 70  Resp: 16 16 16 16   Temp:   98.3 F (36.8 C) 98.3 F (36.8 C)  TempSrc:   Oral Oral  SpO2: 99% 98%  96%  Weight:       Height:        General: Pt is alert, awake, not in acute distress Cardiovascular: RRR, S1/S2 +, no rubs, no gallops Respiratory: CTA bilaterally, no wheezing, no rhonchi Abdominal: Soft, NT, ND, bowel sounds + Extremities: no edema, no cyanosis    The results of significant diagnostics from this hospitalization (including imaging, microbiology, ancillary and laboratory) are listed below for reference.     Microbiology: Recent Results (from the past 240 hour(s))  Blood Culture (Routine X 2)     Status: None   Collection Time: 07/19/23  8:55 AM   Specimen: BLOOD  Result Value Ref Range Status   Specimen Description BLOOD SITE NOT SPECIFIED  Final   Special Requests   Final    BOTTLES DRAWN AEROBIC ONLY Blood Culture adequate volume   Culture   Final    NO GROWTH 5 DAYS Performed at [facility], [address], [place], [place] [address]    Report Status 07/24/2023 FINAL  Final  Blood Culture (Routine X 2)     Status: None   Collection Time: 07/19/23  9:00 AM   Specimen: BLOOD  Result Value Ref Range Status   Specimen Description BLOOD SITE NOT SPECIFIED  Final   Special Requests   Final    BOTTLES DRAWN AEROBIC ONLY Blood Culture adequate volume   Culture   Final    NO GROWTH 5 DAYS Performed at [facility], [address] [place], [place] [address]    Report Status 07/24/2023 FINAL  Final  Resp panel by RT-PCR (RSV, Flu A&B, Covid) Anterior Nasal Swab     Status: None   Collection Time: 07/19/23  9:45 AM   Specimen: Anterior Nasal Swab  Result Value Ref Range Status   SARS Coronavirus 2 by RT PCR NEGATIVE NEGATIVE Final   Influenza A by PCR NEGATIVE NEGATIVE Final   Influenza B by PCR NEGATIVE NEGATIVE Final    Comment: (NOTE) The Xpert Xpress SARS-CoV-2/FLU/RSV plus assay is intended as an aid in the diagnosis of influenza from Nasopharyngeal swab specimens and should not be used as a sole basis for treatment. Nasal washings and aspirates are  unacceptable for Xpert Xpress SARS-CoV-2/FLU/RSV testing.  Fact Sheet for Patients: BloggerCourse.com  Fact Sheet for Healthcare Providers: SeriousBroker.it  This test is not yet approved or cleared by the [place] FDA and has been authorized for detection and/or diagnosis of SARS-CoV-2 by FDA under an Emergency Use Authorization (EUA). This EUA will remain in effect (meaning this test can be used) for the duration of the COVID-19 declaration under Section 564(b)(1) of the Act, 21 U.S.C. section 360bbb-3(b)(1), unless the authorization is terminated or revoked.     Resp Syncytial Virus by PCR NEGATIVE NEGATIVE Final    Comment: (NOTE) Fact Sheet for Patients: BloggerCourse.com  Fact Sheet for Healthcare Providers: SeriousBroker.it  This test is not yet approved or cleared by the [place] FDA and has been authorized for detection and/or diagnosis of SARS-CoV-2 by FDA under an Emergency Use Authorization (EUA). This EUA will remain in effect (meaning this test can be used) for the duration of the COVID-19 declaration under Section 564(b)(1) of the Act, 21 U.S.C. section 360bbb-3(b)(1), unless the authorization is terminated or revoked.  Performed at [facility], [address] [place], [place] [address]      Labs: BNP (last 3 results) Recent Labs    07/14/23 1408 07/19/23 0920 07/21/23 0624  BNP 168.3* 136.4* 73.6   Basic Metabolic Panel: Recent Labs  Lab 07/21/23 0624 07/22/23 1357 07/25/23 1130 07/27/23 0457  NA 139 136 133* 137  K 4.0 3.8 3.3* 4.3  CL 103 100 95* 98  CO2 24 24 28 26   GLUCOSE 159* 176* 281* 152*  BUN 8 12 19 12   CREATININE 0.72 0.78 0.93 0.72  CALCIUM 8.5* 8.9 8.5* 9.1   Liver Function Tests: Recent Labs  Lab 07/22/23 1357  AST 22  ALT 20  ALKPHOS 83  BILITOT 0.5  PROT 6.3*  ALBUMIN 2.6*   No results for  input(s): "LIPASE", "AMYLASE" in the last 168 hours. Recent Labs  Lab 07/22/23 1357  AMMONIA 25   CBC: Recent Labs  Lab 07/21/23 0624  WBC 9.6  HGB 10.2*  HCT 33.6*  MCV 82.6  PLT 396   Cardiac Enzymes: No results for input(s): "CKTOTAL", "CKMB", "CKMBINDEX", "TROPONINI" in the last 168 hours. BNP: Invalid input(s): "POCBNP" CBG: Recent Labs  Lab 07/26/23 0755 07/26/23 1207 07/26/23 1555 07/26/23 2116 07/27/23 0734  GLUCAP 169* 189* 129* 105* 127*   D-Dimer No results for input(s): "DDIMER" in the last 72 hours. Hgb A1c No results for input(s): "HGBA1C" in the last 72 hours. Lipid Profile No results for input(s): "CHOL", "HDL", "LDLCALC", "TRIG", "CHOLHDL", "LDLDIRECT" in the last 72 hours. Thyroid function studies No results for input(s): "TSH", "T4TOTAL", "T3FREE", "THYROIDAB" in the last 72 hours.  Invalid input(s): "FREET3" Anemia work up No results for input(s): "VITAMINB12", "FOLATE", "FERRITIN", "TIBC", "IRON", "RETICCTPCT" in the last 72 hours. Urinalysis    Component Value Date/Time   COLORURINE YELLOW 07/19/2023 1130   APPEARANCEUR CLEAR 07/19/2023 1130   LABSPEC >1.046 (H) 07/19/2023 1130   PHURINE 8.0 07/19/2023 1130   GLUCOSEU NEGATIVE 07/19/2023 1130   HGBUR NEGATIVE 07/19/2023 1130   BILIRUBINUR NEGATIVE 07/19/2023 1130   BILIRUBINUR n 03/18/2015 0928   KETONESUR 5 (A) 07/19/2023 1130   PROTEINUR 30 (A) 07/19/2023 1130   UROBILINOGEN 2.0 03/18/2015 0928   UROBILINOGEN 1.0 07/16/2012 1345   NITRITE NEGATIVE 07/19/2023 1130   LEUKOCYTESUR NEGATIVE 07/19/2023 1130   Sepsis Labs Recent Labs  Lab 07/21/23 0624  WBC 9.6   Microbiology Recent Results (from the past 240 hour(s))  Blood Culture (Routine X 2)     Status: None   Collection Time: 07/19/23  8:55 AM   Specimen: BLOOD  Result Value Ref Range Status   Specimen Description BLOOD SITE NOT SPECIFIED  Final   Special Requests   Final    BOTTLES DRAWN AEROBIC ONLY Blood Culture  adequate volume   Culture   Final    NO GROWTH 5 DAYS Performed at [facility], [address] [place], [place] [address]    Report Status 07/24/2023 FINAL  Final  Blood Culture (Routine X 2)     Status: None   Collection Time: 07/19/23  9:00 AM   Specimen: BLOOD  Result Value Ref Range  Status   Specimen Description BLOOD SITE NOT SPECIFIED  Final   Special Requests   Final    BOTTLES DRAWN AEROBIC ONLY Blood Culture adequate volume   Culture   Final    NO GROWTH 5 DAYS Performed at [facility], [address] [place], [place] [address]    Report Status 07/24/2023 FINAL  Final  Resp panel by RT-PCR (RSV, Flu A&B, Covid) Anterior Nasal Swab     Status: None   Collection Time: 07/19/23  9:45 AM   Specimen: Anterior Nasal Swab  Result Value Ref Range Status   SARS Coronavirus 2 by RT PCR NEGATIVE NEGATIVE Final   Influenza A by PCR NEGATIVE NEGATIVE Final   Influenza B by PCR NEGATIVE NEGATIVE Final    Comment: (NOTE) The Xpert Xpress SARS-CoV-2/FLU/RSV plus assay is intended as an aid in the diagnosis of influenza from Nasopharyngeal swab specimens and should not be used as a sole basis for treatment. Nasal washings and aspirates are unacceptable for Xpert Xpress SARS-CoV-2/FLU/RSV testing.  Fact Sheet for Patients: BloggerCourse.com  Fact Sheet for Healthcare Providers: SeriousBroker.it  This test is not yet approved or cleared by the [place] FDA and has been authorized for detection and/or diagnosis of SARS-CoV-2 by FDA under an Emergency Use Authorization (EUA). This EUA will remain in effect (meaning this test can be used) for the duration of the COVID-19 declaration under Section 564(b)(1) of the Act, 21 U.S.C. section 360bbb-3(b)(1), unless the authorization is terminated or revoked.     Resp Syncytial Virus by PCR NEGATIVE NEGATIVE Final    Comment: (NOTE) Fact Sheet for  Patients: BloggerCourse.com  Fact Sheet for Healthcare Providers: SeriousBroker.it  This test is not yet approved or cleared by the [place] FDA and has been authorized for detection and/or diagnosis of SARS-CoV-2 by FDA under an Emergency Use Authorization (EUA). This EUA will remain in effect (meaning this test can be used) for the duration of the COVID-19 declaration under Section 564(b)(1) of the Act, 21 U.S.C. section 360bbb-3(b)(1), unless the authorization is terminated or revoked.  Performed at [facility], [address], [place], [place] [address]      Time coordinating discharge: Over 35 minutes  SIGNED:   Marinda Elk, MD  [facility] 07/27/2023, 8:40 AM Pager   If 7PM-7AM, please contact night-coverage [email or web address] Password [email or web address]

## 2023-07-27 NOTE — TOC Transition Note (Signed)
Transition of Care [facility]) - CM/SW Discharge Note   Patient Details  Name: Brent [name]. MRN: [id number] Date of Birth: [birth year]  [facility]) CM/SW Contact:  Delilah Shan, LCSWA Phone Number: 07/27/2023, 10:20 AM   Clinical Narrative:     Patient will DC to: [facility]  Anticipated DC date: 07/27/2023  Family notified: [name]  Transport by: Sharin Mons  ?  Per MD patient ready for DC to [facility] . RN, patient, patient's family, and facility notified of DC. Discharge Summary sent to facility. RN given number for report tele#[phone] RM# [phone]. DC packet on chart. Ambulance transport requested for patient.  CSW signing off.   Final next level of care: Skilled Nursing Facility Barriers to Discharge: No Barriers Identified   Patient Goals and CMS Choice   Choice offered to / list presented to : Patient, Spouse  Discharge Placement                Patient chooses bed at: [facility] Patient to be transferred to facility by: [facility] Name of family member notified: spouse [name] Patient and family notified of of transfer: 07/27/23  Discharge Plan and Services Additional resources added to the After Visit Summary for                                       Social Determinants of Health (SDOH) Interventions SDOH Screenings   Food Insecurity: No Food Insecurity (07/19/2023)  Housing: Low Risk  (07/19/2023)  Transportation Needs: No Transportation Needs (07/19/2023)  Utilities: Not At Risk (07/19/2023)  Depression (PHQ2-9): Low Risk  (12/09/2020)  Financial Resource Strain: Low Risk  (03/06/2020)  Physical Activity: Inactive (12/14/2018)  Social Connections: Unknown (12/14/2018)  Stress: No Stress Concern Present (12/14/2018)  Tobacco Use: Medium Risk (07/19/2023)     Readmission Risk Interventions    07/02/2023   12:31 PM  Readmission Risk Prevention Plan  Transportation Screening Complete  Medication Review (RN Care Manager)  Complete  PCP or Specialist appointment within 3-5 days of discharge Not Complete  PCP/Specialist Appt Not Complete comments SNF resident  [facility] or Home Care Consult Complete  SW Recovery Care/Counseling Consult Complete  Palliative Care Screening Not Applicable  Skilled Nursing Facility Complete

## 2023-07-27 NOTE — NC FL2 (Addendum)
[place] MEDICAID FL2 LEVEL OF CARE FORM     IDENTIFICATION  Patient Name: Brent [name]. Birthdate: 1946-08-23 Sex: male Admission Date (Current Location): 07/19/2023  [facility] and [place] Number:  Producer, television/film/video and Address:  [facility], [address], [place], [place] [address]      Provider Number: [id number]  Attending Physician Name and Address:  David Stall, Darin Engels, MD  Relative Name and Phone Number:  [name] (Spouse) [phone]    Current Level of Care: Hospital Recommended Level of Care: Skilled Nursing Facility Prior Approval Number:    Date Approved/Denied:   PASRR Number: [id number]  Discharge Plan: SNF    Current Diagnoses: Patient Active Problem List   Diagnosis Date Noted   Bilateral pleural effusion 07/26/2023   Acute confusional state 07/26/2023   Pleural effusion 07/19/2023   Leukocytosis 07/19/2023   Sinus bradycardia 07/19/2023   (HFpEF) heart failure with preserved ejection fraction (HCC) 07/14/2023   History of osteomyelitis 07/14/2023   GERD (gastroesophageal reflux disease) 07/14/2023   Diabetic foot ulcer (HCC) 07/01/2023   Acute osteomyelitis of metatarsal bone of right foot (HCC) 07/01/2023   Septic arthritis of foot (HCC) 07/01/2023   Colostomy present (HCC) 07/01/2023   DM type 2 (diabetes mellitus, type 2) (HCC) 07/01/2023   Aortic atherosclerosis (HCC) 07/01/2023   Pressure ulcer 07/01/2023   Stage IV decubitus ulcer (HCC) 06/30/2023   Urine leukocytes 06/30/2023   SIRS (systemic inflammatory response syndrome) (HCC) 06/05/2023   Suprapubic catheter (HCC) 06/05/2023   Dehydration 02/21/2023   Sepsis (HCC) 02/21/2023   Acute metabolic encephalopathy 02/21/2023   Lactic acidosis 02/21/2023   Insulin dependent type 2 diabetes mellitus (HCC) 02/21/2023   Normocytic anemia 02/21/2023   Primary malignant neuroendocrine tumor of stomach (HCC) 05/27/2022   Preoperative examination 05/27/2022    Abnormal endoscopy of upper gastrointestinal tract 05/27/2022   Benign neuroendocrine tumor of stomach  04/04/2022   Mucosal abnormality of stomach    Chronic osteomyelitis of pelvic region, right (HCC) 03/25/2022   External gastrointestinal fistula site infection 03/25/2022   Complicated urinary tract infection    Catheter-associated urinary tract infection (HCC) 08/06/2021   Osteomyelitis of pelvic region, acute, right (HCC) 03/24/2021   Right ischial pressure sore 02/10/2021   Stage IV pressure ulcer of sacral region (HCC) 12/22/2020   Hypokalemia    Encounter for monitoring immunomodulating therapy 08/20/2020   High risk medication use 08/20/2020   Malnutrition of moderate degree 06/04/2020   Palliative care by specialist    Goals of care, counseling/discussion    FTT (failure to thrive) in adult 06/01/2020   Abdominal pain    Sacral ulcer, with necrosis of muscle (HCC) 04/09/2020   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HCC)    Transverse myelitis (HCC)    Labile blood pressure    Labile blood glucose    Neurogenic bladder    Neurogenic bowel    Transaminitis    Controlled type 2 diabetes mellitus with hyperglycemia, with long-term current use of insulin (HCC)    Quadriplegia (HCC)    Dyslipidemia    Paraplegia (HCC)    Coronary artery disease involving native coronary artery of native heart without angina pectoris    Steroid-induced hyperglycemia    Diabetic peripheral neuropathy (HCC)    Weakness 03/09/2020   Chest pain 03/08/2020   Right leg numbness    Malignant melanoma of left side of neck (HCC) 10/25/2018   Facial neuritis 10/25/2018   Myofascial pain syndrome 10/25/2018  Occipital neuralgia of left side 10/25/2018   Male hypogonadism 06/20/2016   Renal lesion 06/20/2016   Insomnia 08/07/2015   Enlarged prostate with lower urinary tract symptoms (LUTS) 11/07/2014   Balanitis 07/30/2012   Bladder neck obstruction 07/30/2012   Prostate nodule 06/18/2012   Recurrent  nephrolithiasis 06/18/2012   Benign neoplasm of colon 08/29/2011   DEPRESSION, SITUATIONAL, ACUTE 01/23/2010   Essential hypertension 05/30/2009   Coronary atherosclerosis 05/30/2009   Obstructive sleep apnea 11/28/2008   OBESITY 09/23/2008   ERECTILE DYSFUNCTION 11/26/2007   Well controlled type 2 diabetes mellitus with peripheral circulatory disorder (HCC) 05/09/2007   Hyperlipidemia 05/09/2007   MYOCARDIAL INFARCTION, HX OF 05/09/2007   DIVERTICULOSIS, COLON 05/09/2007    Orientation RESPIRATION BLADDER Height & Weight     Self, Place, Time  Normal Continent (Suprapubic Catheter) Weight: 199 lb 13.6 oz (90.6 kg) Height:  6\' 1"  (185.4 cm)  BEHAVIORAL SYMPTOMS/MOOD NEUROLOGICAL BOWEL NUTRITION STATUS      Continent, Colostomy Diet (Please see discharge summary)  AMBULATORY STATUS COMMUNICATION OF NEEDS Skin   Total Care Verbally Other (Comment) (Ecchymosis,buttocks,Bil.,Erythema,coccyx,Bil.,Wound/Inc. LDAs,PI Buttocks,Bil.,deep tissue,PI Ischial tuberosity,post.,proximal,R,stage 3 Inc.closed,foot,R,Wound/Inc.open or dehis. arm,L,lower,post.,please see additional info)                       Personal Care Assistance Level of Assistance    Bathing Assistance: Maximum assistance Feeding Assistance: Maximum assistance  Dressing Assistance: Maximum assistance     Functional Limitations Info    Sight Info:  Financial trader) Hearing Info: Adequate Speech Info: Adequate    SPECIAL CARE FACTORS FREQUENCY                       Contractures Contractures Info: Not present    Additional Factors Info  Code Status, Allergies, Insulin Sliding Scale, Psychotropic Code Status Info: FULL Allergies Info: Cipro (ciprofloxacin Hcl),Neurontin (gabapentin),Levaquin (levofloxacin) Psychotropic Info: lamoTRIgine (LAMICTAL) tablet 50 mg 2 times daily,traZODone (DESYREL) tablet 100 mg daily at bedtime Insulin Sliding Scale Info: insulin aspart (novoLOG) injection 0-15 Units 3 times daily  with meals,insulin glargine-yfgn (SEMGLEE) injection 24 Units 2 times daily       Current Medications (07/27/2023):  This is the current hospital active medication list Current Facility-Administered Medications  Medication Dose Route Frequency Provider Last Rate Last Admin   acetaminophen (TYLENOL) tablet 650 mg  650 mg Oral Q6H PRN Madelyn Flavors A, MD   650 mg at 07/22/23 0018   Or   acetaminophen (TYLENOL) suppository 650 mg  650 mg Rectal Q6H PRN Madelyn Flavors A, MD       albuterol (PROVENTIL) (2.5 MG/3ML) 0.083% nebulizer solution 2.5 mg  2.5 mg Nebulization Q6H PRN Madelyn Flavors A, MD       alum & mag hydroxide-simeth (MAALOX/MYLANTA) 200-200-20 MG/5ML suspension 30 mL  30 mL Oral Q6H PRN Madelyn Flavors A, MD   30 mL at 07/19/23 1831   ascorbic acid (VITAMIN C) tablet 500 mg  500 mg Oral Daily Katrinka Blazing, Rondell A, MD   500 mg at 07/27/23 0824   atenolol (TENORMIN) tablet 12.5 mg  12.5 mg Oral q AM Crosley, Debby, MD   12.5 mg at 07/27/23 0825   atorvastatin (LIPITOR) tablet 40 mg  40 mg Oral QHS Smith, Rondell A, MD   40 mg at 07/26/23 2210   baclofen (LIORESAL) tablet 20 mg  20 mg Oral TID Madelyn Flavors A, MD   20 mg at 07/27/23 0825   benzonatate (TESSALON) capsule 100 mg  100 mg Oral TID PRN Zigmund Daniel., MD       Chlorhexidine Gluconate Cloth 2 % PADS 6 each  6 each Topical Daily Madelyn Flavors A, MD   6 each at 07/27/23 0827   DULoxetine (CYMBALTA) DR capsule 30 mg  30 mg Oral QHS Smith, Rondell A, MD   30 mg at 07/26/23 2210   enoxaparin (LOVENOX) injection 40 mg  40 mg Subcutaneous Q24H Smith, Rondell A, MD   40 mg at 07/26/23 1711   feeding supplement (ENSURE ENLIVE / ENSURE PLUS) liquid 237 mL  237 mL Oral Q breakfast Marinda Elk, MD   237 mL at 07/27/23 5366   ferrous sulfate tablet 325 mg  325 mg Oral Katherine Roan., MD   325 mg at 07/27/23 0825   furosemide (LASIX) tablet 40 mg  40 mg Oral Daily Marinda Elk, MD   40 mg at 07/27/23 4403    Gerhardt's butt cream   Topical TID Zigmund Daniel., MD   Given at 07/27/23 [phone]   guaiFENesin (MUCINEX) 12 hr tablet 600 mg  600 mg Oral BID Madelyn Flavors A, MD   600 mg at 07/27/23 0824   insulin aspart (novoLOG) injection 0-15 Units  0-15 Units Subcutaneous TID WC Marinda Elk, MD   2 Units at 07/27/23 5956   insulin aspart (novoLOG) injection 0-5 Units  0-5 Units Subcutaneous QHS Marinda Elk, MD       insulin aspart (novoLOG) injection 4 Units  4 Units Subcutaneous TID WC Marinda Elk, MD   4 Units at 07/27/23 0823   insulin glargine-yfgn (SEMGLEE) injection 24 Units  24 Units Subcutaneous BID Madelyn Flavors A, MD   24 Units at 07/27/23 0824   iohexol (OMNIPAQUE) 350 MG/ML injection 75 mL  75 mL Intravenous Once PRN Virgina Norfolk, DO       lamoTRIgine (LAMICTAL) tablet 50 mg  50 mg Oral BID Madelyn Flavors A, MD   50 mg at 07/27/23 0824   leptospermum manuka honey (MEDIHONEY) paste 1 Application  1 Application Topical Daily Zigmund Daniel., MD   1 Application at 07/27/23 0826   lidocaine (LIDODERM) 5 % 1 patch  1 patch Transdermal Q24H Ernie Avena, NP   1 patch at 07/25/23 1752   LORazepam (ATIVAN) tablet 0.5 mg  0.5 mg Oral Daily PRN Madelyn Flavors A, MD   0.5 mg at 07/25/23 2157   nutrition supplement (JUVEN) (JUVEN) powder packet 1 packet  1 packet Oral BID BM Marinda Elk, MD   1 packet at 07/27/23 3875   nystatin (MYCOSTATIN/NYSTOP) topical powder 1 Application  1 Application Topical Daily Madelyn Flavors A, MD   1 Application at 07/27/23 0822   ondansetron (ZOFRAN) injection 4 mg  4 mg Intravenous Q6H PRN Zigmund Daniel., MD   4 mg at 07/25/23 1247   Oral care mouth rinse  15 mL Mouth Rinse PRN Madelyn Flavors A, MD       oxyCODONE (Oxy IR/ROXICODONE) immediate release tablet 5 mg  5 mg Oral BID PRN Madelyn Flavors A, MD   5 mg at 07/25/23 2158   pantoprazole (PROTONIX) EC tablet 40 mg  40 mg Oral Daily Madelyn Flavors A, MD    40 mg at 07/27/23 0825   piperacillin-tazobactam (ZOSYN) IVPB 3.375 g  3.375 g Intravenous Q8H Leander Rams, RPH 12.5 mL/hr at 07/27/23 0805 Infusion Verify at 07/27/23 0805   polyvinyl  alcohol (LIQUIFILM TEARS) 1.4 % ophthalmic solution 1 drop  1 drop Both Eyes PRN Smith, Rondell A, MD       potassium chloride SA (KLOR-CON M) CR tablet 20 mEq  20 mEq Oral Daily Katrinka Blazing, Rondell A, MD   20 mEq at 07/27/23 0824   senna-docusate (Senokot-S) tablet 1 tablet  1 tablet Oral BID Madelyn Flavors A, MD   1 tablet at 07/27/23 0825   sodium chloride flush (NS) 0.9 % injection 10-40 mL  10-40 mL Intracatheter Q12H Zigmund Daniel., MD   10 mL at 07/26/23 0920   sodium chloride flush (NS) 0.9 % injection 10-40 mL  10-40 mL Intracatheter PRN Zigmund Daniel., MD       traZODone (DESYREL) tablet 100 mg  100 mg Oral QHS Smith, Rondell A, MD   100 mg at 07/26/23 2210   zinc sulfate capsule 220 mg  220 mg Oral Daily Madelyn Flavors A, MD   220 mg at 07/27/23 2130     Discharge Medications: Please see discharge summary for a list of discharge medications.  Relevant Imaging Results:  Relevant Lab Results:   Additional Information [id number], Wound/Incision open or dehisced,other, leg,R,posterior,lateral stage 2,ulcer  Delilah Shan, LCSWA

## 2023-07-27 NOTE — Progress Notes (Signed)
   Palliative Medicine Inpatient Follow Up Note HPI: Brent [name]. is a 77 y.o. male with medical history significant of quadriparesis and quadriplegia due to transverse myelitis, GI fistula s/p post wedge resection 9/24, neurogenic bladder s/p suprapubic catheter, stage IV decubitus ulcer, insulin-dependent DM type II, asthma who presents with complaints of malaise. Patient admitted and treated for volume overload. Palliative care has been asked to get involved to assist with goals of care conversations.   Today's Discussion 07/27/2023  *Please note that this is a verbal dictation therefore any spelling or grammatical errors are due to the "Dragon Medical One" system interpretation.  Chart reviewed inclusive of vital signs, progress notes, laboratory results, and diagnostic images.   I met with Brent [name] at bedside this morning. He is in good spirits. We discussed patients present condition(s) - heart failure and his improvement since hospitalization.   Plan for Brent [name] to transition to [facility] this morning.  Questions and concerns addressed/Palliative Support Provided.   Objective Assessment: Vital Signs Vitals:   07/26/23 1956 07/27/23 0322  BP: (!) 134/56 (!) 138/53  Pulse: 74 70  Resp: 16 16  Temp: 98.3 F (36.8 C) 98.3 F (36.8 C)  SpO2:  96%    Intake/Output Summary (Last 24 hours) at 07/27/2023 0737 Last data filed at 07/27/2023 0805 Gross per 24 hour  Intake 766.11 ml  Output 1350 ml  Net -583.89 ml   Last Weight  Most recent update: 07/25/2023  4:21 AM    Weight  90.6 kg (199 lb 13.6 oz)            Gen:  Elderly Caucasian M chronically ill appearing HEENT: moist mucous membranes CV: Regular rate and rhythm  PULM: On RA, breathing is even and nonlabored ABD: soft/nontender  EXT: No edema  Neuro: Alert and oriented x3   SUMMARY OF RECOMMENDATIONS   FULL CODE - Would not want a tracheostomy, G-Tube, or long term artificial support   Open and honest  conversations held in the setting of patients acute on chronic disease burden   Goal of patient is to transition to living back at home  Neck Pain --> Improved: - Continue oxycodone - Continue Lidoderm patches on 12 hours/off 12 hours - Heat application PRN   Plan for discharge today  Patient declined OP Palliative support  Time: 25 ______________________________________________________________________________________ Lamarr Lulas [place] Palliative Medicine Team Team Cell Phone: [phone] Please utilize secure chat with additional questions, if there is no response within 30 minutes please call the above phone number  Palliative Medicine Team providers are available by phone from 7am to 7pm daily and can be reached through the team cell phone.  Should this patient require assistance outside of these hours, please call the patient's attending physician.

## 2023-07-27 NOTE — Progress Notes (Signed)
Peripherally Inserted Central Catheter Placement  The IV Nurse has discussed with the patient and/or persons authorized to consent for the patient, the purpose of this procedure and the potential benefits and risks involved with this procedure.  The benefits include less needle sticks, lab draws from the catheter, and the patient may be discharged home with the catheter. Risks include, but not limited to, infection, bleeding, blood clot (thrombus formation), and puncture of an artery; nerve damage and irregular heartbeat and possibility to perform a PICC exchange if needed/ordered by physician.  Alternatives to this procedure were also discussed.  Bard Power PICC patient education guide, fact sheet on infection prevention and patient information card has been provided to patient /or left at bedside.    PICC Placement Documentation  PICC Single Lumen 07/27/23 Right Cephalic 40 cm 1 cm (Active)  Indication for Insertion or Continuance of Line Prolonged intravenous therapies 07/27/23 1208  Exposed Catheter (cm) 1 cm 07/27/23 1208  Site Assessment Clean, Dry, Intact 07/27/23 1208  Line Status Flushed;Blood return noted 07/27/23 1208  Dressing Type Transparent 07/27/23 1208  Dressing Status Antimicrobial disc in place 07/27/23 1208  Line Care Connections checked and tightened 07/27/23 1208  Dressing Intervention New dressing 07/27/23 1208  Dressing Change Due 08/03/23 07/27/23 1208       Audrie Gallus 07/27/2023, 12:10 PM

## 2023-07-30 ENCOUNTER — Encounter (HOSPITAL_COMMUNITY): Payer: Self-pay

## 2023-07-30 ENCOUNTER — Other Ambulatory Visit: Payer: Self-pay

## 2023-07-30 ENCOUNTER — Inpatient Hospital Stay (HOSPITAL_COMMUNITY)
Admission: EM | Admit: 2023-07-30 | Discharge: 2023-08-08 | DRG: 871 | Disposition: A | Discharge status: Skilled Nursing Facility | Payer: Medicare Other | Source: Skilled Nursing Facility | Attending: Internal Medicine | Admitting: Internal Medicine

## 2023-07-30 ENCOUNTER — Emergency Department (HOSPITAL_COMMUNITY): Payer: Medicare Other

## 2023-07-30 DIAGNOSIS — E876 Hypokalemia: Secondary | ICD-10-CM | POA: Diagnosis not present

## 2023-07-30 DIAGNOSIS — J9601 Acute respiratory failure with hypoxia: Secondary | ICD-10-CM | POA: Diagnosis present

## 2023-07-30 DIAGNOSIS — M86171 Other acute osteomyelitis, right ankle and foot: Secondary | ICD-10-CM | POA: Diagnosis present

## 2023-07-30 DIAGNOSIS — E781 Pure hyperglyceridemia: Secondary | ICD-10-CM | POA: Diagnosis present

## 2023-07-30 DIAGNOSIS — G373 Acute transverse myelitis in demyelinating disease of central nervous system: Secondary | ICD-10-CM | POA: Diagnosis present

## 2023-07-30 DIAGNOSIS — Z881 Allergy status to other antibiotic agents status: Secondary | ICD-10-CM

## 2023-07-30 DIAGNOSIS — Z9049 Acquired absence of other specified parts of digestive tract: Secondary | ICD-10-CM

## 2023-07-30 DIAGNOSIS — J9600 Acute respiratory failure, unspecified whether with hypoxia or hypercapnia: Secondary | ICD-10-CM

## 2023-07-30 DIAGNOSIS — J690 Pneumonitis due to inhalation of food and vomit: Secondary | ICD-10-CM | POA: Diagnosis present

## 2023-07-30 DIAGNOSIS — I2510 Atherosclerotic heart disease of native coronary artery without angina pectoris: Secondary | ICD-10-CM | POA: Diagnosis present

## 2023-07-30 DIAGNOSIS — F419 Anxiety disorder, unspecified: Secondary | ICD-10-CM | POA: Diagnosis present

## 2023-07-30 DIAGNOSIS — I5032 Chronic diastolic (congestive) heart failure: Secondary | ICD-10-CM | POA: Diagnosis present

## 2023-07-30 DIAGNOSIS — E1169 Type 2 diabetes mellitus with other specified complication: Secondary | ICD-10-CM | POA: Diagnosis present

## 2023-07-30 DIAGNOSIS — Z87891 Personal history of nicotine dependence: Secondary | ICD-10-CM

## 2023-07-30 DIAGNOSIS — R6520 Severe sepsis without septic shock: Secondary | ICD-10-CM | POA: Diagnosis present

## 2023-07-30 DIAGNOSIS — I959 Hypotension, unspecified: Secondary | ICD-10-CM | POA: Diagnosis present

## 2023-07-30 DIAGNOSIS — I110 Hypertensive heart disease with heart failure: Secondary | ICD-10-CM | POA: Diagnosis present

## 2023-07-30 DIAGNOSIS — A419 Sepsis, unspecified organism: Secondary | ICD-10-CM | POA: Diagnosis not present

## 2023-07-30 DIAGNOSIS — E1165 Type 2 diabetes mellitus with hyperglycemia: Secondary | ICD-10-CM | POA: Diagnosis not present

## 2023-07-30 DIAGNOSIS — Z1152 Encounter for screening for COVID-19: Secondary | ICD-10-CM

## 2023-07-30 DIAGNOSIS — I252 Old myocardial infarction: Secondary | ICD-10-CM

## 2023-07-30 DIAGNOSIS — L89154 Pressure ulcer of sacral region, stage 4: Secondary | ICD-10-CM | POA: Diagnosis present

## 2023-07-30 DIAGNOSIS — F32A Depression, unspecified: Secondary | ICD-10-CM | POA: Diagnosis present

## 2023-07-30 DIAGNOSIS — Z794 Long term (current) use of insulin: Secondary | ICD-10-CM

## 2023-07-30 DIAGNOSIS — N319 Neuromuscular dysfunction of bladder, unspecified: Secondary | ICD-10-CM | POA: Diagnosis present

## 2023-07-30 DIAGNOSIS — E11649 Type 2 diabetes mellitus with hypoglycemia without coma: Secondary | ICD-10-CM | POA: Diagnosis present

## 2023-07-30 DIAGNOSIS — G929 Unspecified toxic encephalopathy: Secondary | ICD-10-CM | POA: Diagnosis present

## 2023-07-30 DIAGNOSIS — Z85820 Personal history of malignant melanoma of skin: Secondary | ICD-10-CM

## 2023-07-30 DIAGNOSIS — D638 Anemia in other chronic diseases classified elsewhere: Secondary | ICD-10-CM | POA: Diagnosis present

## 2023-07-30 DIAGNOSIS — Z888 Allergy status to other drugs, medicaments and biological substances status: Secondary | ICD-10-CM

## 2023-07-30 DIAGNOSIS — I700 Atherosclerosis of aorta: Secondary | ICD-10-CM | POA: Diagnosis present

## 2023-07-30 DIAGNOSIS — Z860100 Personal history of colon polyps, unspecified: Secondary | ICD-10-CM

## 2023-07-30 DIAGNOSIS — G9340 Encephalopathy, unspecified: Secondary | ICD-10-CM | POA: Diagnosis not present

## 2023-07-30 DIAGNOSIS — Z7984 Long term (current) use of oral hypoglycemic drugs: Secondary | ICD-10-CM

## 2023-07-30 DIAGNOSIS — Z79899 Other long term (current) drug therapy: Secondary | ICD-10-CM

## 2023-07-30 DIAGNOSIS — R9431 Abnormal electrocardiogram [ECG] [EKG]: Secondary | ICD-10-CM | POA: Diagnosis present

## 2023-07-30 DIAGNOSIS — Z933 Colostomy status: Secondary | ICD-10-CM

## 2023-07-30 DIAGNOSIS — R4182 Altered mental status, unspecified: Principal | ICD-10-CM

## 2023-07-30 DIAGNOSIS — Z955 Presence of coronary angioplasty implant and graft: Secondary | ICD-10-CM

## 2023-07-30 DIAGNOSIS — Z8249 Family history of ischemic heart disease and other diseases of the circulatory system: Secondary | ICD-10-CM

## 2023-07-30 DIAGNOSIS — G4733 Obstructive sleep apnea (adult) (pediatric): Secondary | ICD-10-CM | POA: Diagnosis present

## 2023-07-30 DIAGNOSIS — Z781 Physical restraint status: Secondary | ICD-10-CM

## 2023-07-30 LAB — I-STAT ARTERIAL BLOOD GAS, ED
Acid-base deficit: 2 mmol/L (ref 0.0–2.0)
Bicarbonate: 21.9 mmol/L (ref 20.0–28.0)
Calcium, Ion: 1.24 mmol/L (ref 1.15–1.40)
HCT: 27 % — ABNORMAL LOW (ref 39.0–52.0)
Hemoglobin: 9.2 g/dL — ABNORMAL LOW (ref 13.0–17.0)
O2 Saturation: 100 %
Patient temperature: 95.5
Potassium: 3.5 mmol/L (ref 3.5–5.1)
Sodium: 139 mmol/L (ref 135–145)
TCO2: 23 mmol/L (ref 22–32)
pCO2 arterial: 32.5 mm[Hg] (ref 32–48)
pH, Arterial: 7.43 (ref 7.35–7.45)
pO2, Arterial: 246 mm[Hg] — ABNORMAL HIGH (ref 83–108)

## 2023-07-30 LAB — CBG MONITORING, ED: Glucose-Capillary: 122 mg/dL — ABNORMAL HIGH (ref 70–99)

## 2023-07-30 LAB — CBC WITH DIFFERENTIAL/PLATELET
Abs Immature Granulocytes: 0.09 10*3/uL — ABNORMAL HIGH (ref 0.00–0.07)
Basophils Absolute: 0.1 10*3/uL (ref 0.0–0.1)
Basophils Relative: 1 %
Eosinophils Absolute: 0.4 10*3/uL (ref 0.0–0.5)
Eosinophils Relative: 4 %
HCT: 32.4 % — ABNORMAL LOW (ref 39.0–52.0)
Hemoglobin: 9.4 g/dL — ABNORMAL LOW (ref 13.0–17.0)
Immature Granulocytes: 1 %
Lymphocytes Relative: 12 %
Lymphs Abs: 1.1 10*3/uL (ref 0.7–4.0)
MCH: 24.3 pg — ABNORMAL LOW (ref 26.0–34.0)
MCHC: 29 g/dL — ABNORMAL LOW (ref 30.0–36.0)
MCV: 83.7 fL (ref 80.0–100.0)
Monocytes Absolute: 0.6 10*3/uL (ref 0.1–1.0)
Monocytes Relative: 6 %
Neutro Abs: 6.9 10*3/uL (ref 1.7–7.7)
Neutrophils Relative %: 76 %
Platelets: 410 10*3/uL — ABNORMAL HIGH (ref 150–400)
RBC: 3.87 MIL/uL — ABNORMAL LOW (ref 4.22–5.81)
RDW: 16.4 % — ABNORMAL HIGH (ref 11.5–15.5)
WBC: 9 10*3/uL (ref 4.0–10.5)
nRBC: 0 % (ref 0.0–0.2)

## 2023-07-30 LAB — PROTIME-INR
INR: 1.1 (ref 0.8–1.2)
Prothrombin Time: 14.4 s (ref 11.4–15.2)

## 2023-07-30 LAB — COMPREHENSIVE METABOLIC PANEL
ALT: 50 U/L — ABNORMAL HIGH (ref 0–44)
AST: 54 U/L — ABNORMAL HIGH (ref 15–41)
Albumin: 2.7 g/dL — ABNORMAL LOW (ref 3.5–5.0)
Alkaline Phosphatase: 86 U/L (ref 38–126)
Anion gap: 12 (ref 5–15)
BUN: 13 mg/dL (ref 8–23)
CO2: 22 mmol/L (ref 22–32)
Calcium: 9.4 mg/dL (ref 8.9–10.3)
Chloride: 105 mmol/L (ref 98–111)
Creatinine, Ser: 0.65 mg/dL (ref 0.61–1.24)
GFR, Estimated: >60 mL/min (ref >60)
Glucose, Bld: 131 mg/dL — ABNORMAL HIGH (ref 70–99)
Potassium: 3.6 mmol/L (ref 3.5–5.1)
Sodium: 139 mmol/L (ref 135–145)
Total Bilirubin: 0.3 mg/dL (ref <1.2)
Total Protein: 6.1 g/dL — ABNORMAL LOW (ref 6.5–8.1)

## 2023-07-30 LAB — URINALYSIS, W/ REFLEX TO CULTURE (INFECTION SUSPECTED)
Bilirubin Urine: NEGATIVE
Glucose, UA: NEGATIVE mg/dL
Ketones, ur: NEGATIVE mg/dL
Nitrite: NEGATIVE
Protein, ur: 100 mg/dL — AB
RBC / HPF: >50 RBC/hpf (ref 0–5)
Specific Gravity, Urine: 1.018 (ref 1.005–1.030)
pH: 5 (ref 5.0–8.0)

## 2023-07-30 LAB — I-STAT VENOUS BLOOD GAS, ED
Acid-base deficit: 1 mmol/L (ref 0.0–2.0)
Bicarbonate: 25 mmol/L (ref 20.0–28.0)
Calcium, Ion: 1.2 mmol/L (ref 1.15–1.40)
HCT: 33 % — ABNORMAL LOW (ref 39.0–52.0)
Hemoglobin: 11.2 g/dL — ABNORMAL LOW (ref 13.0–17.0)
O2 Saturation: 99 %
Potassium: 3.6 mmol/L (ref 3.5–5.1)
Sodium: 139 mmol/L (ref 135–145)
TCO2: 26 mmol/L (ref 22–32)
pCO2, Ven: 47.4 mm[Hg] (ref 44–60)
pH, Ven: 7.33 (ref 7.25–7.43)
pO2, Ven: 155 mm[Hg] — ABNORMAL HIGH (ref 32–45)

## 2023-07-30 LAB — I-STAT CHEM 8, ED
BUN: 14 mg/dL (ref 8–23)
Calcium, Ion: 1.21 mmol/L (ref 1.15–1.40)
Chloride: 103 mmol/L (ref 98–111)
Creatinine, Ser: 0.6 mg/dL — ABNORMAL LOW (ref 0.61–1.24)
Glucose, Bld: 123 mg/dL — ABNORMAL HIGH (ref 70–99)
HCT: 32 % — ABNORMAL LOW (ref 39.0–52.0)
Hemoglobin: 10.9 g/dL — ABNORMAL LOW (ref 13.0–17.0)
Potassium: 3.6 mmol/L (ref 3.5–5.1)
Sodium: 139 mmol/L (ref 135–145)
TCO2: 25 mmol/L (ref 22–32)

## 2023-07-30 LAB — I-STAT CG4 LACTIC ACID, ED: Lactic Acid, Venous: 2.5 mmol/L (ref 0.5–1.9)

## 2023-07-30 LAB — RESP PANEL BY RT-PCR (RSV, FLU A&B, COVID)  RVPGX2
Influenza A by PCR: NEGATIVE
Influenza B by PCR: NEGATIVE
Resp Syncytial Virus by PCR: NEGATIVE
SARS Coronavirus 2 by RT PCR: NEGATIVE

## 2023-07-30 LAB — APTT: aPTT: 49 s — ABNORMAL HIGH (ref 24–36)

## 2023-07-30 MED ORDER — FENTANYL BOLUS VIA INFUSION
25.0000 ug | INTRAVENOUS | Status: DC | PRN
Start: 2023-07-30 — End: 2023-07-31

## 2023-07-30 MED ORDER — LACTATED RINGERS IV SOLN: INTRAVENOUS | Status: DC

## 2023-07-30 MED ORDER — VANCOMYCIN HCL IN DEXTROSE 1-5 GM/200ML-% IV SOLN: 1000.0000 mg | Freq: Once | INTRAVENOUS | Status: DC

## 2023-07-30 MED ORDER — FENTANYL 2500MCG IN NS 250ML (10MCG/ML) PREMIX INFUSION
25.0000 ug/h | INTRAVENOUS | Status: DC
  Administered 2023-07-30: 25 ug/h via INTRAVENOUS
  Filled 2023-07-30: qty 250

## 2023-07-30 MED ORDER — METRONIDAZOLE 500 MG/100ML IV SOLN
500.0000 mg | Freq: Once | INTRAVENOUS | Status: AC
  Administered 2023-07-30: 500 mg via INTRAVENOUS
  Filled 2023-07-30: qty 100

## 2023-07-30 MED ORDER — VANCOMYCIN HCL 2000 MG/400ML IV SOLN
2000.0000 mg | Freq: Once | INTRAVENOUS | Status: AC
  Administered 2023-07-30: 2000 mg via INTRAVENOUS
  Filled 2023-07-30: qty 400

## 2023-07-30 MED ORDER — IOHEXOL 350 MG/ML SOLN
75.0000 mL | Freq: Once | INTRAVENOUS | Status: AC | PRN
  Administered 2023-07-30: 75 mL via INTRAVENOUS

## 2023-07-30 MED ORDER — SODIUM CHLORIDE 0.9 % IV SOLN
250.0000 mL | INTRAVENOUS | Status: AC
  Administered 2023-07-30: 250 mL via INTRAVENOUS

## 2023-07-30 MED ORDER — ETOMIDATE 2 MG/ML IV SOLN
INTRAVENOUS | Status: DC | PRN
  Administered 2023-07-30: 20 mg via INTRAVENOUS

## 2023-07-30 MED ORDER — SODIUM CHLORIDE 0.9 % IV SOLN
2.0000 g | Freq: Once | INTRAVENOUS | Status: AC
  Administered 2023-07-30: 2 g via INTRAVENOUS
  Filled 2023-07-30: qty 12.5

## 2023-07-30 MED ORDER — ROCURONIUM BROMIDE 10 MG/ML (PF) SYRINGE
PREFILLED_SYRINGE | INTRAVENOUS | Status: DC | PRN
  Administered 2023-07-30: 100 mg via INTRAVENOUS

## 2023-07-30 MED ORDER — LACTATED RINGERS IV BOLUS (SEPSIS)
1000.0000 mL | Freq: Once | INTRAVENOUS | Status: AC
  Administered 2023-07-30: 1000 mL via INTRAVENOUS

## 2023-07-30 MED ORDER — SODIUM CHLORIDE 0.9 % IV BOLUS
1000.0000 mL | Freq: Once | INTRAVENOUS | Status: AC
  Administered 2023-07-30: 1000 mL via INTRAVENOUS

## 2023-07-30 MED ORDER — NOREPINEPHRINE 4 MG/250ML-% IV SOLN
INTRAVENOUS | Status: AC
  Administered 2023-07-30: 4 ug/min via INTRAVENOUS
  Filled 2023-07-30: qty 250

## 2023-07-30 MED ORDER — NOREPINEPHRINE 4 MG/250ML-% IV SOLN
2.0000 ug/min | INTRAVENOUS | Status: DC
  Administered 2023-07-30: 10 ug/min via INTRAVENOUS
  Administered 2023-07-31: 2 ug/min via INTRAVENOUS
  Filled 2023-07-30: qty 250

## 2023-07-30 MED ORDER — KETAMINE HCL 50 MG/5ML IJ SOSY
100.0000 mg | PREFILLED_SYRINGE | Freq: Once | INTRAMUSCULAR | Status: DC
  Filled 2023-07-30 (×2): qty 10

## 2023-07-30 NOTE — H&P (Addendum)
NAME:  Brent [name]., MRN:  [id number], DOB:  1946-02-03, LOS: 0 ADMISSION DATE:  07/30/2023, CONSULTATION DATE:  11/10 REFERRING MD:  Dr. Lynelle Doctor, CHIEF COMPLAINT:  AMS   History of Present Illness:  Patient is a 77 year old male with pertinent PMH quadriparesis/quadriplegia from transverse myelitis, HFpEF, hx of osteomyelitis of right fifth metatarsal and septic arthritis of fifth MTP joint on zosyn until 08/14/2023, GI fistula s/p wedge resection 05/2023, neurogenic bladder with suprapubic catheter, DMT2, htn, hld, asthma presents to [facility] ED on 11/10 with AMS.  Patient recently hospitalized on 10/11-10/17 w/ right foot osteomyelitis. OR cultures growing. Providencia/Proteus/E faecalis/Bacteroides. Scheduled to be on 6 weeks of IV Zosyn per ID until 08/14/2023.  Had a multiple admissions in October due to sob from pleural effusions treated from chf treated w/ lasix. Recently discharged on 11/7 to [facility].   On 11/10 patient admitted to [facility] with AMS.  Per EMS was agitated earlier in the morning but not giving any medications.  Patient later in the evening found poorly responsive and EMS called.  Noted to be bradycardic 50s and hypoxic requiring [place]coe. CBG 58 given dextrose.  Patient remained altered. Patient given IV fluids and atropine for bradycardia.  Transferred to [facility]. Temp 95 F.  Cultures obtained and started on broad-spectrum antibiotics.  Given unresponsiveness patient intubated for airway protection.  CXR with layering left-sided pleural effusion.  CT head no acute abnormality. ABG WNL. UA w/ small leukocytes. LA 2.5 and wbc 9. PCCM consulted for icu admission.   Pertinent  Medical History   Past Medical History:  Diagnosis Date   Acute osteomyelitis of metatarsal bone of right foot (HCC) 07/01/2023   Allergy    Aortic atherosclerosis (HCC) 07/01/2023   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 angioplasty of his RCA in  1990. b. bare metal stent placed in the RCA in 2000 followed by rotational atherectomy shortly after for stent restenosis. c. last cath was in 2012 showing stable moderate diffuse CAD. (70% mid LAD, 80% diagonal, 70% Ramus, 40% mid to distal RCA stent restenosis). d. Low risk nuc in 2015.   Diabetes mellitus    Diverticulosis    Elevated CK    Erectile dysfunction    Hemorrhoids    HTN (hypertension)    Hyperlipidemia    Hypertriglyceridemia    Malignant melanoma of left side of neck (HCC) 10/25/2018   Myocardial infarction (HCC)    Obesity    OSA (obstructive sleep apnea)    Persistent disorder of initiating or maintaining sleep    Personal history of colonic polyps 02/05/2003   Renal lesion 06/20/2016   Septic arthritis of foot (HCC) 07/01/2023   Stage IV decubitus ulcer (HCC) 06/30/2023     Significant Hospital Events: Including procedures, antibiotic start and stop dates in addition to other pertinent events   11/11 admit to [facility] w/ AMS requiring intubation for gcs 1  Interim History / Subjective:  See above  Objective   Blood pressure (!) 131/54, pulse (!) 59, temperature (!) 95.6 F (35.3 C), temperature source Temporal, resp. rate 17, height 6\' 1"  (1.854 m), weight 90.3 kg, SpO2 100%.    Vent Mode: PRVC FiO2 (%):  [40 %-60 %] 40 % Set Rate:  [18 bmp] 18 bmp Vt Set:  [550 mL-620 mL] 620 mL PEEP:  [5 cmH20] 5 cmH20 Plateau Pressure:  [17 cmH20-18 cmH20] 18 cmH20  No intake or output data  in the 24 hours ending 07/30/23 2348 Filed Weights   07/30/23 2204  Weight: 90.3 kg    Examination: General:  critically ill appearing on mech vent HEENT: MM pink/moist; ETT in place Neuro: unresponsive to pain; PERRL CV: s1s2, brady 50s, no m/r/g PULM:  dim clear BS bilaterally; on mech vent PRVC GI: soft, bsx4 active; ostomy bag in place Extremities: warm/dry, no edema  Skin: no rashes or lesions     Resolved Hospital Problem list     Assessment & Plan:  Acute  encephalopathy: patient was initially agitated outpt and unsure if received some ativan then later that evening became altered/unresponsive, hypoxic, bradycardic. CBG 60 for EMS given dextrose. Had some delirium in hospital last admission. Plan: -CT head no acute abnormality -UDS pending -check ammonia and tsh -limit sedating meds -hold sedation for RASS 0 to -1 -if patient remains encephalopathic consider obtaining MRI and/or EEG  Acute respiratory failure: intubated due to GCS 1 Recurrent Left Pleural Effusion Plan: -LTVV strategy with tidal volumes of 6-8 cc/kg ideal body weight -Wean PEEP/FiO2 for SpO2 >92% -VAP bundle in place -Daily SAT and SBT -PAD protocol in place -wean sedation for RASS goal 0 to -1 -hold on diuresis given hypotension -Follow intermittent CXR  Sepsis History of osteomyelitis of the right fifth metatarsal/septic arthritis of fifth MTP joint Possible UTI -Currently on IV Zosyn for 6 weeks end date of 08/14/2023. Plan: -continue zosyn -IV fluids given -trend LA -follow cultures  Chronic diastolic CHF Plan: -hold on diuresis for now with hypotension -daily weight; strict I/O's  Sinus bradycardia -last admission was bradycardic 40-50s Plan: -hold home antihypertensives -tele monitoring -check tsh -check mag  Prolonged qtc Plan: -trend and replete electrolytes as needed -tele monitoring -avoid qtc prolonging agents  Hx of HTN/HLD Plan: -hold anti-hypertensives w/ soft BP -statin  DMT2 Plan: -ssi and cbg monitoring  Chronic anemia Plan: -trend cbc  Paraplegia secondary to transverse myelitis Neurogenic bladder s/p suprapubic catheter Plan: -hold baclofen with ams  Stage IV decubitus ulcer POA Ischial decubitus ulcer POA Plan: -woc consult  Depression/anxiety Plan: -hold home meds  History of gastrocolic fistula S/p colostomy Plan: -Continue colostomy care  Best Practice (right click and "Reselect all SmartList  Selections" daily)   Diet/type: NPO w/ meds via tube DVT prophylaxis: LMWH GI prophylaxis: H2B Lines: N/A Foley:  Yes, and it is still needed; suprapubic catheter Code Status:  full code Last date of multidisciplinary goals of care discussion [11/11 spoke w/ wife [name] over phone]  Labs   CBC: Recent Labs  Lab 07/30/23 2207 07/30/23 2242 07/30/23 2303  WBC 9.0  --   --   NEUTROABS 6.9  --   --   HGB 9.4* 11.2*  10.9* 9.2*  HCT 32.4* 33.0*  32.0* 27.0*  MCV 83.7  --   --   PLT 410*  --   --     Basic Metabolic Panel: Recent Labs  Lab 07/25/23 1130 07/27/23 0457 07/30/23 2207 07/30/23 2242 07/30/23 2303  NA 133* 137 139 139  139 139  K 3.3* 4.3 3.6 3.6  3.6 3.5  CL 95* 98 105 103  --   CO2 28 26 22   --   --   GLUCOSE 281* 152* 131* 123*  --   BUN 19 12 13 14   --   CREATININE 0.93 0.72 0.65 0.60*  --   CALCIUM 8.5* 9.1 9.4  --   --    GFR: Estimated Creatinine Clearance: 86 mL/min (A) (  by C-G formula based on SCr of 0.6 mg/dL (L)). Recent Labs  Lab 07/30/23 2207 07/30/23 2242  WBC 9.0  --   LATICACIDVEN  --  2.5*    Liver Function Tests: Recent Labs  Lab 07/30/23 2207  AST 54*  ALT 50*  ALKPHOS 86  BILITOT 0.3  PROT 6.1*  ALBUMIN 2.7*   No results for input(s): "LIPASE", "AMYLASE" in the last 168 hours. No results for input(s): "AMMONIA" in the last 168 hours.  ABG    Component Value Date/Time   PHART 7.430 07/30/2023 2303   PCO2ART 32.5 07/30/2023 2303   PO2ART 246 (H) 07/30/2023 2303   HCO3 21.9 07/30/2023 2303   TCO2 23 07/30/2023 2303   ACIDBASEDEF 2.0 07/30/2023 2303   O2SAT 100 07/30/2023 2303     Coagulation Profile: Recent Labs  Lab 07/30/23 2207  INR 1.1    Cardiac Enzymes: No results for input(s): "CKTOTAL", "CKMB", "CKMBINDEX", "TROPONINI" in the last 168 hours.  HbA1C: Hgb A1c MFr Bld  Date/Time Value Ref Range Status  06/06/2023 02:36 AM 7.5 (H) 4.8 - 5.6 % Final    Comment:    (NOTE) Pre diabetes:           5.7%-6.4%  Diabetes:              >6.4%  Glycemic control for   <7.0% adults with diabetes   05/22/2023 03:44 AM 8.0 (H) 4.8 - 5.6 % Final    Comment:    (NOTE) Pre diabetes:          5.7%-6.4%  Diabetes:              >6.4%  Glycemic control for   <7.0% adults with diabetes     CBG: Recent Labs  Lab 07/26/23 1207 07/26/23 1555 07/26/23 2116 07/27/23 0734 07/30/23 2202  GLUCAP 189* 129* 105* 127* 122*    Review of Systems:   Patient is encephalopathic and/or intubated; therefore, history has been obtained from chart review.    Past Medical History:  He,  has a past medical history of Acute osteomyelitis of metatarsal bone of right foot (HCC) (07/01/2023), Allergy, Aortic atherosclerosis (HCC) (07/01/2023), Benign neuroendocrine tumor of stomach (04/04/2022), CAD (coronary artery disease), Diabetes mellitus, Diverticulosis, Elevated CK, Erectile dysfunction, Hemorrhoids, HTN (hypertension), Hyperlipidemia, Hypertriglyceridemia, Malignant melanoma of left side of neck (HCC) (10/25/2018), Myocardial infarction (HCC), Obesity, OSA (obstructive sleep apnea), Persistent disorder of initiating or maintaining sleep, Personal history of colonic polyps (02/05/2003), Renal lesion (06/20/2016), Septic arthritis of foot (HCC) (07/01/2023), and Stage IV decubitus ulcer (HCC) (06/30/2023).   Surgical History:   Past Surgical History:  Procedure Laterality Date   AMPUTATION TOE Right 07/02/2023   Procedure: AMPUTATION TOE FIFTH RAY;  Surgeon: Netta Cedars, MD;  Location: [facility] ORS;  Service: Orthopedics;  Laterality: Right;   BIOPSY  03/26/2022   Procedure: BIOPSY;  Surgeon: Iva Boop, MD;  Location: [facility] ENDOSCOPY;  Service: Gastroenterology;;   CHOLECYSTECTOMY     CORONARY ANGIOPLASTY     CORONARY STENT PLACEMENT     stenting of the right coronary artery with followup rotational  atherectomy. (3 stents placed)   ENDOSCOPIC MUCOSAL RESECTION  10/13/2022   Procedure: ENDOSCOPIC  MUCOSAL RESECTION;  Surgeon: Meridee Score Netty Starring., MD;  Location: [facility] ENDOSCOPY;  Service: Gastroenterology;;   ESOPHAGOGASTRODUODENOSCOPY (EGD) WITH PROPOFOL N/A 03/26/2022   Procedure: ESOPHAGOGASTRODUODENOSCOPY (EGD) WITH PROPOFOL;  Surgeon: Iva Boop, MD;  Location: [facility] ENDOSCOPY;  Service: Gastroenterology;  Laterality: N/A;   ESOPHAGOGASTRODUODENOSCOPY (EGD) WITH  PROPOFOL N/A 07/11/2022   Procedure: ESOPHAGOGASTRODUODENOSCOPY (EGD) WITH PROPOFOL;  Surgeon: Meridee Score Netty Starring., MD;  Location: [facility] ENDOSCOPY;  Service: Gastroenterology;  Laterality: N/A;   ESOPHAGOGASTRODUODENOSCOPY (EGD) WITH PROPOFOL N/A 10/13/2022   Procedure: ESOPHAGOGASTRODUODENOSCOPY (EGD) WITH PROPOFOL;  Surgeon: Meridee Score Netty Starring., MD;  Location: [facility] ENDOSCOPY;  Service: Gastroenterology;  Laterality: N/A;   EUS N/A 10/13/2022   Procedure: UPPER ENDOSCOPIC ULTRASOUND (EUS) RADIAL;  Surgeon: Lemar Lofty., MD;  Location: [facility] ENDOSCOPY;  Service: Gastroenterology;  Laterality: N/A;   FINGER SURGERY     right   FOOT SURGERY     right   HEMOSTASIS CLIP PLACEMENT  07/11/2022   Procedure: HEMOSTASIS CLIP PLACEMENT;  Surgeon: Meridee Score Netty Starring., MD;  Location: [facility] ENDOSCOPY;  Service: Gastroenterology;;  ovesco    HEMOSTASIS CLIP PLACEMENT  10/13/2022   Procedure: HEMOSTASIS CLIP PLACEMENT;  Surgeon: Lemar Lofty., MD;  Location: [facility] ENDOSCOPY;  Service: Gastroenterology;;   HOT HEMOSTASIS N/A 07/11/2022   Procedure: HOT HEMOSTASIS (ARGON PLASMA COAGULATION/BICAP);  Surgeon: Lemar Lofty., MD;  Location: [facility] ENDOSCOPY;  Service: Gastroenterology;  Laterality: N/A;   HOT HEMOSTASIS N/A 10/13/2022   Procedure: HOT HEMOSTASIS (ARGON PLASMA COAGULATION/BICAP);  Surgeon: Lemar Lofty., MD;  Location: [facility] ENDOSCOPY;  Service: Gastroenterology;  Laterality: N/A;   INGUINAL HERNIA REPAIR     right   IR FLUORO PROCEDURE UNLISTED  02/21/2023   IR GASTROSTOMY TUBE REMOVAL  02/10/2022    IR REPLACE G-TUBE SIMPLE WO FLUORO  03/14/2023   IR REPLC GASTRO/COLONIC TUBE PERCUT W/FLUORO  12/22/2022   IR REPLC GASTRO/COLONIC TUBE PERCUT W/FLUORO  01/26/2023   IR REPLC GASTRO/COLONIC TUBE PERCUT W/FLUORO  03/06/2023   IR REPLC GASTRO/COLONIC TUBE PERCUT W/FLUORO  04/27/2023   IRRIGATION AND DEBRIDEMENT ABSCESS N/A 06/04/2020   Procedure: IRRIGATION AND DEBRIDEMENT SACRAL WOUND;  Surgeon: Diamantina Monks, MD;  Location: [facility] OR;  Service: General;  Laterality: N/A;   LAPAROSCOPY N/A 06/04/2020   Procedure: LAPAROSCOPY ASSISTED COLOSTOMY, LAPAROSCOPY GASTROSTOMY;  Surgeon: Diamantina Monks, MD;  Location: [facility] OR;  Service: General;  Laterality: N/A;   LAPAROSCOPY N/A 05/24/2023   Procedure: LAPAROSCOPY DIAGNOSTIC;  Surgeon: Emelia Loron, MD;  Location: [facility] OR;  Service: General;  Laterality: N/A;   LAPAROTOMY N/A 05/24/2023   Procedure: WEDGED RESECTION OF STOMACH AND GASTROCUTANEOUS FISTULA;  Surgeon: Emelia Loron, MD;  Location: [facility] OR;  Service: General;  Laterality: N/A;   LYSIS OF ADHESION N/A 06/04/2020   Procedure: LYSIS OF ADHESION;  Surgeon: Diamantina Monks, MD;  Location: [facility] OR;  Service: General;  Laterality: N/A;   LYSIS OF ADHESION N/A 05/24/2023   Procedure: LYSIS OF ADHESION;  Surgeon: Emelia Loron, MD;  Location: [facility] OR;  Service: General;  Laterality: N/A;   melanoma removal     neck   ORTHOPEDIC SURGERY     foot right   POLYPECTOMY  10/13/2022   Procedure: POLYPECTOMY;  Surgeon: Lemar Lofty., MD;  Location: [facility] ENDOSCOPY;  Service: Gastroenterology;;   rotator cuff surg     Bil   STENT REMOVAL  10/13/2022   Procedure: CLIP REMOVAL;  Surgeon: Lemar Lofty., MD;  Location: [facility] ENDOSCOPY;  Service: Gastroenterology;;   SUBMUCOSAL LIFTING INJECTION  10/13/2022   Procedure: SUBMUCOSAL LIFTING INJECTION;  Surgeon: Lemar Lofty., MD;  Location: [facility]OSCOPY;  Service: Gastroenterology;;   SUBMUCOSAL TATTOO INJECTION  10/13/2022   Procedure:  SUBMUCOSAL TATTOO INJECTION;  Surgeon: Lemar Lofty., MD;  Location: [facility] ENDOSCOPY;  Service: Gastroenterology;;  Social History:   reports that he quit smoking about 42 years ago. His smoking use included cigarettes. He started smoking about 72 years ago. He has a 45 pack-year smoking history. He has never used smokeless tobacco. He reports that he does not currently use alcohol after a past usage of about 7.0 - 14.0 standard drinks of alcohol per week. He reports that he does not use drugs.   Family History:  His family history includes COPD in his mother; Heart disease in his father and sister; Liver cancer in his father; Lung cancer in his father and mother; Obesity in his mother. There is no history of Colon cancer, Stomach cancer, Pancreatic cancer, Esophageal cancer, Inflammatory bowel disease, Liver disease, or Rectal cancer.   Allergies Allergies  Allergen Reactions   Cipro [Ciprofloxacin Hcl] Other (See Comments)    Hallucinations Allergy not listed on MAR   Neurontin [Gabapentin] Other (See Comments)    Unknown reaction   Levaquin [Levofloxacin] Rash     Home Medications  Prior to Admission medications   Medication Sig Start Date End Date Taking? Authorizing Provider  acetaminophen (TYLENOL) 500 MG tablet Take 2 tablets (1,000 mg total) by mouth every 6 (six) hours as needed for mild pain (or Fever >/= 101). 05/31/23   Rhetta Mura, MD  atenolol (TENORMIN) 25 MG tablet Take 12.5 mg by mouth in the morning.    [provider]  atorvastatin (LIPITOR) 40 MG tablet Take 1 tablet (40 mg total) by mouth at bedtime. 05/31/23   Rhetta Mura, MD  baclofen (LIORESAL) 20 MG tablet Take 1 tablet (20 mg total) by mouth 3 (three) times daily. 06/06/23   Amin, Ankit C, MD  cholecalciferol (VITAMIN D3) 25 MCG (1000 UNIT) tablet Take 2,000 Units by mouth daily.    [provider]  DULoxetine (CYMBALTA) 30 MG capsule Take 1 capsule (30 mg total) by  mouth at bedtime. 05/31/23   Rhetta Mura, MD  famotidine (PEPCID) 20 MG tablet Take 20 mg by mouth 2 (two) times daily.    [provider]  ferrous sulfate 325 (65 FE) MG tablet Take 1 tablet (325 mg total) by mouth every other day. 07/27/23   Marinda Elk, MD  furosemide (LASIX) 40 MG tablet Take 1 tablet (40 mg total) by mouth daily. 07/18/23   Meredeth Ide, MD  guaiFENesin (MUCINEX) 600 MG 12 hr tablet Take 600 mg by mouth 2 (two) times daily.    [provider]  guaiFENesin-dextromethorphan (ROBITUSSIN DM) 100-10 MG/5ML syrup Take 10 mLs by mouth every 6 (six) hours as needed for cough.    [provider]  insulin aspart (NOVOLOG) 100 UNIT/ML injection Inject 0-15 Units into the skin every 4 (four) hours. Sliding scale  CBG 70 - 120: 0 units: CBG 121 - 150: 2 units; CBG 151 - 200: 3 units; CBG 201 - 250: 5 units; CBG 251 - 300: 8 units;CBG 301 - 350: 11 units; CBG 351 - 400: 15 units; CBG > 400 : 15 units and notify MD Patient taking differently: Inject 0-15 Units into the skin See admin instructions. Inject 0-15 units before meals per sliding scale: 70-120 : 0 units 121-150 : 2 units 151-200 : 3 units 201-250 : 5 units 251-300 : 8 units 301-350 : 11 units 351-400 : 15 units > 400 : 15 units, notify MD 06/10/20   Rai, Ripudeep K, MD  insulin glargine-yfgn (SEMGLEE, YFGN,) 100 UNIT/ML Pen Inject 24 Units into the skin 2 (two)  times daily. 07/27/23   Marinda Elk, MD  lamoTRIgine (LAMICTAL) 25 MG tablet Take 2 tablets (50 mg total) by mouth 2 (two) times daily. For 5 days. Started 02/18/23. 07/06/23   Glade Lloyd, MD  LORazepam (ATIVAN) 0.5 MG tablet Take 1 tablet (0.5 mg total) by mouth daily as needed (panic attacks). 07/27/23   Marinda Elk, MD  Menthol, Topical Analgesic, (BIOFREEZE) 4 % GEL Apply 1 application  topically 2 (two) times daily as needed (pain to back (upper and lower),arms and shoulders).    [provider]   metFORMIN (GLUCOPHAGE) 1000 MG tablet Take 1,000 mg by mouth in the morning and at bedtime.    [provider]  nystatin powder Apply 1 Application topically daily. Apply to peri-wound bed, sacrum and right ischium daily with routine dressing.    [provider]  omeprazole (PRILOSEC) 40 MG capsule Take 1 capsule (40 mg total) by mouth 2 (two) times daily. 30 minutes before breakfast/dinner 10/13/22   Mansouraty, Netty Starring., MD  ondansetron (ZOFRAN-ODT) 4 MG disintegrating tablet Take 4 mg by mouth every 8 (eight) hours as needed for nausea or vomiting.    [provider]  oxyCODONE (OXY IR/ROXICODONE) 5 MG immediate release tablet Take 1 tablet (5 mg total) by mouth 2 (two) times daily as needed for breakthrough pain. 07/27/23   Marinda Elk, MD  piperacillin-tazobactam (ZOSYN) IVPB Inject 3.375 g into the vein every 6 (six) hours. Indication: diabetic foot infection/osteomyelitis  First Dose: Yes Last Day of Therapy: 08/15/2023 Labs - Once weekly:  CBC/D and BMP, Labs - Once weekly: ESR and CRP 07/06/23 08/15/23  Glade Lloyd, MD  Polyethyl Glycol-Propyl Glycol (GENTEAL TEARS SEVERE DAY/NIGHT) 0.4-0.3 % GEL ophthalmic gel Place 1 Application into both eyes 4 (four) times daily as needed (dry eyes).    [provider]  polyethylene glycol (MIRALAX / GLYCOLAX) 17 g packet Take 17 g by mouth daily as needed for mild constipation. 07/06/23   Glade Lloyd, MD  potassium chloride SA (KLOR-CON M) 20 MEQ tablet Take 1 tablet (20 mEq total) by mouth daily. Take along with Lasix 07/17/23   Meredeth Ide, MD  riTUXimab (RITUXAN IV) Inject 10 mg into the vein once. Between the 12th-13th of June, December.    [provider]  senna-docusate (SENOKOT-S) 8.6-50 MG tablet Take 1 tablet by mouth 2 (two) times daily.    [provider]  traZODone (DESYREL) 100 MG tablet Take 100 mg by mouth at bedtime.    [provider]  vitamin C (ASCORBIC  ACID) 500 MG tablet Take 500 mg by mouth daily.    [provider]  zinc sulfate 220 (50 Zn) MG capsule Take 1 capsule (220 mg total) by mouth daily. 07/07/23   Glade Lloyd, MD     Critical care time: 45 minutes     JD Daryel [facility] 07/30/2023, 11:48 PM  Please see [email or web address] for pager details.  From 7A-7P if no response, please call [phone]. After hours, please call ELink [phone].

## 2023-07-30 NOTE — Progress Notes (Signed)
ED Pharmacy Antibiotic Sign Off An antibiotic consult was received from an ED provider for vancomycin/cefepime per pharmacy dosing for empiric sepsis coverage. A chart review was completed to assess appropriateness.   The following one time order(s) were placed:  Cefepime 2g IV once and vancomycin 2g IV once   Further antibiotic and/or antibiotic pharmacy consults should be ordered by the admitting provider if indicated.   Thank you for allowing pharmacy to participate in this patient's care,  Sherron Monday, PharmD, BCCCP Clinical Pharmacist  Phone: [phone] 07/30/2023 10:35 PM  Please check AMION for all [facility] Pharmacy phone numbers After 10:00 PM, call Main Pharmacy [phone]

## 2023-07-30 NOTE — ED Notes (Signed)
Wife Essa Handshoe [phone] would like an update asap

## 2023-07-30 NOTE — ED Provider Notes (Signed)
Patient has history of multiple medical problems including quadriplegia related to transverse myelitis.  Patient has history of neurogenic bladder decubitus ulcer diabetes osteomyelitis.  Patient has a colostomy.  He was just discharged from the hospital 3 days ago.  Patient was sent to the ED for evaluation of altered mental status.  Initially EMS received report the patient was agitated.  However when they arrived they noted the patient was unresponsive bradycardic and hypotensive.  He was treated with IV fluids and atropine. Physical Exam  BP (!) 141/82   Pulse 67   Temp (!) 95.1 F (35.1 C) (Temporal)   Resp 17   Ht 1.854 m (6\' 1" )   Wt 90.3 kg   SpO2 100%   BMI 26.25 kg/m   Physical Exam Constitutional:      Appearance: He is ill-appearing.  Cardiovascular:     Rate and Rhythm: Normal rate.  Pulmonary:     Effort: Pulmonary effort is normal.  Neurological:     Mental Status: He is unresponsive.     GCS: GCS eye subscore is 1. GCS verbal subscore is 1. GCS motor subscore is 4.     Procedures  .Critical Care  Performed by: Linwood Dibbles, MD Authorized by: Linwood Dibbles, MD   Critical care provider statement:    Critical care time (minutes):  30   Critical care was time spent personally by me on the following activities:  Development of treatment plan with patient or surrogate, discussions with consultants, evaluation of patient's response to treatment, examination of patient, ordering and review of laboratory studies, ordering and review of radiographic studies, ordering and performing treatments and interventions, pulse oximetry, re-evaluation of patient's condition and review of old charts   ED Course / MDM    Medical Decision Making Risk Prescription drug management.   Patient presented with acute altered mental status.  Patient initially hypotensive bradycardic.  Patient intubated for airway protection.  Sepsis protocol initiated.  Pressors IV fluids initiated.   I saw  and evaluated the patient, reviewed the resident's note and I agree with the findings and plan.        Linwood Dibbles, MD 07/30/23 2231

## 2023-07-30 NOTE — ED Triage Notes (Signed)
Arrives [facility] from [facility] for psych evaluation r/t aggression. Reported aggression throughout the day. Ordered was 0.5mg  Ativan but facility Pyxis was empty.   No staff visualized patient after shift change until notification made to 911.   Administered by paramedics: 10G D10,  1mg  Atropine,  100cc NS.   Initial cbg 58 ith improvement to 128 but remains unresponsive.   Tx abx right foot infection.   HR 48 Cbg 58 80/40

## 2023-07-30 NOTE — ED Provider Notes (Signed)
[place] EMERGENCY DEPARTMENT AT [facility] Provider Note   CSN: [id number] Arrival date & time: 07/30/23  2158     History  No chief complaint on file.   Brent [name]. is a 77 y.o. 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   LKN unknown. History is obtained from EMS and chart review as patient is unresponsive. Patient was allegedly agitated this morning but was not given any medications for agitation.  When he was checked on several hours later this evening he was poorly responsive in his room not responding to voice or pain.  He was breathing however and had a pulse.  EMS was called.  He was noted to be bradycardic in the 50s with hypoxia requiring nasal cannula for stabilization.  He was given 1 mg of atropine with improvement of heart rate hypoxia persisted.  Glucose was 58 with improvement to 128 after 10 grams of D10 was given.  Patient's mentation did not improve.   HPI     Home Medications Prior to Admission medications   Medication Sig Start Date End Date Taking? Authorizing Provider  acetaminophen (TYLENOL) 500 MG tablet Take 2 tablets (1,000 mg total) by mouth every 6 (six) hours as needed for mild pain (or Fever >/= 101). 05/31/23   Rhetta Mura, MD  atenolol (TENORMIN) 25 MG tablet Take 12.5 mg by mouth in the morning.    [provider]  atorvastatin (LIPITOR) 40 MG tablet Take 1 tablet (40 mg total) by mouth at bedtime. 05/31/23   Rhetta Mura, MD  baclofen (LIORESAL) 20 MG tablet Take 1 tablet (20 mg total) by mouth 3  (three) times daily. 06/06/23   Amin, Ankit C, MD  cholecalciferol (VITAMIN D3) 25 MCG (1000 UNIT) tablet Take 2,000 Units by mouth daily.    [provider]  DULoxetine (CYMBALTA) 30 MG capsule Take 1 capsule (30 mg total) by mouth at bedtime. 05/31/23   Rhetta Mura, MD  famotidine (PEPCID) 20 MG tablet Take 20 mg by mouth 2 (two) times daily.    [provider]  ferrous sulfate 325 (65 FE) MG tablet Take 1 tablet (325 mg total) by mouth every other day. 07/27/23   Marinda Elk, MD  furosemide (LASIX) 40 MG tablet Take 1 tablet (40 mg total) by mouth daily. 07/18/23   Meredeth Ide, MD  guaiFENesin (MUCINEX) 600 MG 12 hr tablet Take 600 mg by mouth 2 (two) times daily.    [provider]  guaiFENesin-dextromethorphan (ROBITUSSIN DM) 100-10 MG/5ML syrup Take 10 mLs by mouth every 6 (six) hours as needed for cough.    [provider]  insulin aspart (NOVOLOG) 100 UNIT/ML injection Inject 0-15 Units into the skin every 4 (four) hours. Sliding scale  CBG 70 - 120: 0 units: CBG 121 - 150: 2 units; CBG 151 - 200: 3 units; CBG 201 - 250: 5 units; CBG 251 - 300: 8 units;CBG 301 - 350: 11 units; CBG 351 - 400: 15 units; CBG > 400 : 15 units and notify MD Patient taking differently: Inject 0-15 Units into the skin See admin  instructions. Inject 0-15 units before meals per sliding scale: 70-120 : 0 units 121-150 : 2 units 151-200 : 3 units 201-250 : 5 units 251-300 : 8 units 301-350 : 11 units 351-400 : 15 units > 400 : 15 units, notify MD 06/10/20   Rai, Ripudeep K, MD  insulin glargine-yfgn (SEMGLEE, YFGN,) 100 UNIT/ML Pen Inject 24 Units into the skin 2 (two) times daily. 07/27/23   Marinda Elk, MD  lamoTRIgine (LAMICTAL) 25 MG tablet Take 2 tablets (50 mg total) by mouth 2 (two) times daily. For 5 days. Started 02/18/23. 07/06/23   Glade Lloyd, MD  LORazepam (ATIVAN) 0.5 MG tablet Take 1 tablet (0.5 mg total) by mouth daily as needed (panic  attacks). 07/27/23   Marinda Elk, MD  Menthol, Topical Analgesic, (BIOFREEZE) 4 % GEL Apply 1 application  topically 2 (two) times daily as needed (pain to back (upper and lower),arms and shoulders).    [provider]  metFORMIN (GLUCOPHAGE) 1000 MG tablet Take 1,000 mg by mouth in the morning and at bedtime.    [provider]  nystatin powder Apply 1 Application topically daily. Apply to peri-wound bed, sacrum and right ischium daily with routine dressing.    [provider]  omeprazole (PRILOSEC) 40 MG capsule Take 1 capsule (40 mg total) by mouth 2 (two) times daily. 30 minutes before breakfast/dinner 10/13/22   Mansouraty, Netty Starring., MD  ondansetron (ZOFRAN-ODT) 4 MG disintegrating tablet Take 4 mg by mouth every 8 (eight) hours as needed for nausea or vomiting.    [provider]  oxyCODONE (OXY IR/ROXICODONE) 5 MG immediate release tablet Take 1 tablet (5 mg total) by mouth 2 (two) times daily as needed for breakthrough pain. 07/27/23   Marinda Elk, MD  piperacillin-tazobactam (ZOSYN) IVPB Inject 3.375 g into the vein every 6 (six) hours. Indication: diabetic foot infection/osteomyelitis  First Dose: Yes Last Day of Therapy: 08/15/2023 Labs - Once weekly:  CBC/D and BMP, Labs - Once weekly: ESR and CRP 07/06/23 08/15/23  Glade Lloyd, MD  Polyethyl Glycol-Propyl Glycol (GENTEAL TEARS SEVERE DAY/NIGHT) 0.4-0.3 % GEL ophthalmic gel Place 1 Application into both eyes 4 (four) times daily as needed (dry eyes).    [provider]  polyethylene glycol (MIRALAX / GLYCOLAX) 17 g packet Take 17 g by mouth daily as needed for mild constipation. 07/06/23   Glade Lloyd, MD  potassium chloride SA (KLOR-CON M) 20 MEQ tablet Take 1 tablet (20 mEq total) by mouth daily. Take along with Lasix 07/17/23   Meredeth Ide, MD  riTUXimab (RITUXAN IV) Inject 10 mg into the vein once. Between the 12th-13th of June, December.    [provider]   senna-docusate (SENOKOT-S) 8.6-50 MG tablet Take 1 tablet by mouth 2 (two) times daily.    [provider]  traZODone (DESYREL) 100 MG tablet Take 100 mg by mouth at bedtime.    [provider]  vitamin C (ASCORBIC ACID) 500 MG tablet Take 500 mg by mouth daily.    [provider]  zinc sulfate 220 (50 Zn) MG capsule Take 1 capsule (220 mg total) by mouth daily. 07/07/23   Glade Lloyd, MD      Allergies    Cipro [ciprofloxacin hcl], Neurontin [gabapentin], and Levaquin [levofloxacin]    Review of Systems   Review of Systems per HPI above  Physical Exam Updated Vital Signs BP (!) 131/54   Pulse (!) 59   Temp (!) 95.6 F (35.3 C) (  Temporal)   Resp 17   Ht 6\' 1"  (1.854 m)   Wt 90.3 kg   SpO2 100%   BMI 26.25 kg/m  Physical Exam Constitutional:      Appearance: He is ill-appearing.     Comments: Eyes closed, poor mentation, not responsive to voice or pain  HENT:     Head: Normocephalic and atraumatic.     Nose: Nose normal.     Mouth/Throat:     Mouth: Mucous membranes are dry.  Eyes:     Pupils: Pupils are equal, round, and reactive to light.     Comments: Roving eye movements. No gaze paralysis  Cardiovascular:     Rate and Rhythm: Normal rate and regular rhythm.     Pulses: Normal pulses.     Heart sounds: Normal heart sounds.  Pulmonary:     Effort: Pulmonary effort is normal.     Breath sounds: Normal breath sounds.  Abdominal:     General: There is no distension.     Palpations: Abdomen is soft.     Tenderness: There is no abdominal tenderness. There is no guarding.     Comments: Colostomy in place, abdominal surgical wounds in place intact clean dry  Musculoskeletal:     Cervical back: Neck supple. No rigidity or tenderness.     Right lower leg: No edema.     Left lower leg: No edema.  Skin:    General: Skin is warm and dry.  Neurological:     Comments: GCS 6. Eyes closed. No speech. Withdraws lower extremities to pain but not  upper. No purposeful movements of head or neck. Does not follow commands     ED Results / Procedures / Treatments   Labs (all labs ordered are listed, but only abnormal results are displayed) Labs Reviewed  COMPREHENSIVE METABOLIC PANEL - Abnormal; Notable for the following components:      Result Value   Glucose, Bld 131 (*)    Total Protein 6.1 (*)    Albumin 2.7 (*)    AST 54 (*)    ALT 50 (*)    All other components within normal limits  CBC WITH DIFFERENTIAL/PLATELET - Abnormal; Notable for the following components:   RBC 3.87 (*)    Hemoglobin 9.4 (*)    HCT 32.4 (*)    MCH 24.3 (*)    MCHC 29.0 (*)    RDW 16.4 (*)    Platelets 410 (*)    Abs Immature Granulocytes 0.09 (*)    All other components within normal limits  APTT - Abnormal; Notable for the following components:   aPTT 49 (*)    All other components within normal limits  URINALYSIS, W/ REFLEX TO CULTURE (INFECTION SUSPECTED) - Abnormal; Notable for the following components:   APPearance HAZY (*)    Hgb urine dipstick MODERATE (*)    Protein, ur 100 (*)    Leukocytes,Ua SMALL (*)    Bacteria, UA RARE (*)    All other components within normal limits  CBG MONITORING, ED - Abnormal; Notable for the following components:   Glucose-Capillary 122 (*)    All other components within normal limits  I-STAT CG4 LACTIC ACID, ED - Abnormal; Notable for the following components:   Lactic Acid, Venous 2.5 (*)    All other components within normal limits  I-STAT VENOUS BLOOD GAS, ED - Abnormal; Notable for the following components:   pO2, Ven 155 (*)    HCT 33.0 (*)  Hemoglobin 11.2 (*)    All other components within normal limits  I-STAT CHEM 8, ED - Abnormal; Notable for the following components:   Creatinine, Ser 0.60 (*)    Glucose, Bld 123 (*)    Hemoglobin 10.9 (*)    HCT 32.0 (*)    All other components within normal limits  I-STAT ARTERIAL BLOOD GAS, ED - Abnormal; Notable for the following components:    pO2, Arterial 246 (*)    HCT 27.0 (*)    Hemoglobin 9.2 (*)    All other components within normal limits  RESP PANEL BY RT-PCR (RSV, FLU A&B, COVID)  RVPGX2  CULTURE, BLOOD (ROUTINE X 2)  CULTURE, BLOOD (ROUTINE X 2)  EXPECTORATED SPUTUM ASSESSMENT W GRAM STAIN, RFLX TO RESP C  PROTIME-INR  RAPID URINE DRUG SCREEN, HOSP PERFORMED    EKG EKG Interpretation Date/Time:  Sunday July 30 2023 22:02:04 EST Ventricular Rate:  71 PR Interval:  190 QRS Duration:  108 QT Interval:  464 QTC Calculation: 505 R Axis:   31  Text Interpretation: Sinus rhythm Borderline T wave abnormalities Prolonged QT interval , new since last tracing Confirmed by Linwood Dibbles [phone]) on 07/30/2023 10:32:40 PM  Radiology DG Chest Portable 1 View  Result Date: 07/30/2023 CLINICAL DATA:  Questionable sepsis EXAM: PORTABLE CHEST 1 VIEW COMPARISON:  07/26/2023 FINDINGS: Right PICC tip in the low SVC. Endotracheal tube in the intrathoracic trachea 6.1 cm from the carina. Subdiaphragmatic enteric tube. Stable cardiomediastinal silhouette. Aortic atherosclerotic calcification. Layering left pleural effusion. Retrocardiac atelectasis or pneumonia. No pneumothorax. IMPRESSION: Layering left pleural effusion. Retrocardiac atelectasis or pneumonia. Electronically Signed   By: Minerva Fester M.D.   On: 07/30/2023 23:40    Procedures Procedure Name: Intubation Date/Time: 07/30/2023 10:37 PM  Performed by: Karmen Stabs, MDPre-anesthesia Checklist: Patient identified, Patient being monitored, Timeout performed, Suction available and Emergency Drugs available Oxygen Delivery Method: Nasal cannula Preoxygenation: Pre-oxygenation with 100% oxygen Induction Type: Rapid sequence Grade View: Grade I Tube size: 7.5 mm Number of attempts: 1 Airway Equipment and Method: Video-laryngoscopy Placement Confirmation: ETT inserted through vocal cords under direct vision, Positive ETCO2, CO2 detector and Breath sounds checked- equal  and bilateral Secured at: 22 cm Tube secured with: ETT holder Dental Injury: Teeth and Oropharynx as per pre-operative assessment         Medications Ordered in ED Medications  fentaNYL in NS (5mcg/ml) infusion-PREMIX (25 mcg/hr Intravenous New Bag/Given 07/30/23 2232)  fentaNYL (SUBLIMAZE) bolus via infusion 25-100 mcg (has no administration in time range)  ketamine 50 mg in normal saline 5 mL (10 mg/mL) syringe (0 mg Intravenous Hold 07/30/23 2229)  etomidate (AMIDATE) injection (20 mg Intravenous Given 07/30/23 2212)  rocuronium (ZEMURON) injection (100 mg Intravenous Given 07/30/23 2212)  0.9 %  sodium chloride infusion ( Intravenous Restarted 07/30/23 2316)  norepinephrine (LEVOPHED) 4mg  in (0.016 mg/mL) premix infusion (2 mcg/min Intravenous Restarted 07/30/23 2315)  lactated ringers infusion ( Intravenous New Bag/Given 07/30/23 2237)  lactated ringers bolus 1,000 mL (0 mLs Intravenous Stopped 07/30/23 2339)    And  lactated ringers bolus 1,000 mL (0 mLs Intravenous Stopped 07/30/23 2339)    And  lactated ringers bolus 1,000 mL (1,000 mLs Intravenous New Bag/Given 07/30/23 2339)  metroNIDAZOLE (FLAGYL) IVPB 500 mg (500 mg Intravenous New Bag/Given 07/30/23 2249)  vancomycin (VANCOREADY) IVPB 2000 mg/400 mL (2,000 mg Intravenous New Bag/Given 07/30/23 2259)  sodium chloride 0.9 % bolus 1,000 mL (0 mLs Intravenous Stopped 07/30/23 2225)  ceFEPIme (MAXIPIME)  2 g in sodium chloride 0.9 % 100 mL IVPB (0 g Intravenous Stopped 07/30/23 2247)  iohexol (OMNIPAQUE) 350 MG/ML injection 75 mL (75 mLs Intravenous Contrast Given 07/30/23 2334)    ED Course/ Medical Decision Making/ A&P                                 Medical Decision Making Amount and/or Complexity of Data Reviewed Labs: ordered. Decision-making details documented in ED Course. Radiology: ordered and independent interpretation performed. Decision-making details documented in ED  Course. ECG/medicine tests: ordered and independent interpretation performed. Decision-making details documented in ED Course.  Risk Prescription drug management. Decision regarding hospitalization.   77 year old gentleman who presents for altered mental status.  He has several chronic medical comorbidities and has been in the hospital several times over the past few months.  He most recently was here for infection requiring long-term antibiotics and CHF exacerbation.  He is currently on Zosyn for osteomyelitis.  He presents from his facility for initially agitation followed by unresponsiveness with unknown last known normal and unknown time of onset of unresponsiveness.  He arrives with paperwork that confirms he is full code.   He is initially hypotensive 91/49, with normal HR and O2 sat but requiring [place] O2.  He is afebrile to 35.1, actually mildly hypothermic.  Her glucose was obtained here and is normal.  EKG was obtained and shows no overt ischemic changes, no overt hyperkalemic changes or other acute pathology.  No evidence of high degree heart block.   After IV access was obtained patient was intubated for airway protection and low GCS with etomidate and roc RSI.  ET tube placement confirmed with chest x-ray. OGT placed. He became more hypotensive after RSI meds pushed to 74/45.  Fluid resuscitation was started as well as levo.   Differential for his presentation is broad and includes sepsis due to several potential sources not limited to pulmonary, urinary, known osteomyelitis, sacral wound infection, CNS infection, line infection from his PICC, or other source.  Also includes metabolic derangements, ACS, stroke.  Broad workup for infectious metabolic etiologies ensued with CT head without contrast, sepsis/infectious labs, chest x-ray, as well as CT abdomen pelvis with IV contrast to investigate his sacral wound or for intra-abdominal infection.  He was treated for code sepsis protocol with 30  cc/kg fluid resuscitation and broad-spectrum antibiotics for undifferentiated source including Vanc, cefepime, flagyl after cultures obtained.   Labs show a normal blood gas with pH 7.43 on ABG with pCO2 of 32.5.  He does not have leukocytosis.  His hemoglobin is 9.4, relatively stable from prior.  His coags are normal.  Initial lactate 2.5.  Creatinine 0.65, normal BUN and electrolytes, AST and ALT in 50s remainder of LFTs are normal this is new from previous. UA small LE and rare bacteria but negative nitrite, not overwhelmingly positive for UTI; Ucx in process.   CTHWO and CT A/P in process when patient was admitted to ICU for further and definitive care.           Final Clinical Impression(s) / ED Diagnoses Final diagnoses:  Altered mental status, unspecified altered mental status type    Rx / DC Orders ED Discharge Orders     None         Karmen Stabs, MD 07/30/23 Criss Rosales    Linwood Dibbles, MD 08/01/23 [phone]

## 2023-07-30 NOTE — Sepsis Progress Note (Signed)
Elink following for sepsis protocol. 

## 2023-07-31 ENCOUNTER — Inpatient Hospital Stay (HOSPITAL_COMMUNITY): Payer: Medicare Other

## 2023-07-31 ENCOUNTER — Telehealth: Payer: Self-pay | Admitting: Internal Medicine

## 2023-07-31 DIAGNOSIS — L89154 Pressure ulcer of sacral region, stage 4: Secondary | ICD-10-CM | POA: Diagnosis present

## 2023-07-31 DIAGNOSIS — E781 Pure hyperglyceridemia: Secondary | ICD-10-CM | POA: Diagnosis present

## 2023-07-31 DIAGNOSIS — D638 Anemia in other chronic diseases classified elsewhere: Secondary | ICD-10-CM | POA: Diagnosis present

## 2023-07-31 DIAGNOSIS — I5032 Chronic diastolic (congestive) heart failure: Secondary | ICD-10-CM | POA: Diagnosis present

## 2023-07-31 DIAGNOSIS — E876 Hypokalemia: Secondary | ICD-10-CM | POA: Diagnosis not present

## 2023-07-31 DIAGNOSIS — Z781 Physical restraint status: Secondary | ICD-10-CM | POA: Diagnosis not present

## 2023-07-31 DIAGNOSIS — G9340 Encephalopathy, unspecified: Secondary | ICD-10-CM | POA: Diagnosis present

## 2023-07-31 DIAGNOSIS — Z933 Colostomy status: Secondary | ICD-10-CM | POA: Diagnosis not present

## 2023-07-31 DIAGNOSIS — R6520 Severe sepsis without septic shock: Secondary | ICD-10-CM | POA: Diagnosis present

## 2023-07-31 DIAGNOSIS — A419 Sepsis, unspecified organism: Secondary | ICD-10-CM | POA: Diagnosis present

## 2023-07-31 DIAGNOSIS — N319 Neuromuscular dysfunction of bladder, unspecified: Secondary | ICD-10-CM | POA: Diagnosis present

## 2023-07-31 DIAGNOSIS — J9601 Acute respiratory failure with hypoxia: Secondary | ICD-10-CM | POA: Diagnosis present

## 2023-07-31 DIAGNOSIS — E11649 Type 2 diabetes mellitus with hypoglycemia without coma: Secondary | ICD-10-CM | POA: Diagnosis present

## 2023-07-31 DIAGNOSIS — I700 Atherosclerosis of aorta: Secondary | ICD-10-CM | POA: Diagnosis present

## 2023-07-31 DIAGNOSIS — G929 Unspecified toxic encephalopathy: Secondary | ICD-10-CM | POA: Diagnosis present

## 2023-07-31 DIAGNOSIS — E1169 Type 2 diabetes mellitus with other specified complication: Secondary | ICD-10-CM | POA: Diagnosis present

## 2023-07-31 DIAGNOSIS — G373 Acute transverse myelitis in demyelinating disease of central nervous system: Secondary | ICD-10-CM | POA: Diagnosis present

## 2023-07-31 DIAGNOSIS — J9600 Acute respiratory failure, unspecified whether with hypoxia or hypercapnia: Secondary | ICD-10-CM

## 2023-07-31 DIAGNOSIS — E1165 Type 2 diabetes mellitus with hyperglycemia: Secondary | ICD-10-CM | POA: Diagnosis not present

## 2023-07-31 DIAGNOSIS — Z1152 Encounter for screening for COVID-19: Secondary | ICD-10-CM | POA: Diagnosis not present

## 2023-07-31 DIAGNOSIS — M86171 Other acute osteomyelitis, right ankle and foot: Secondary | ICD-10-CM | POA: Diagnosis present

## 2023-07-31 DIAGNOSIS — I110 Hypertensive heart disease with heart failure: Secondary | ICD-10-CM | POA: Diagnosis present

## 2023-07-31 DIAGNOSIS — Z794 Long term (current) use of insulin: Secondary | ICD-10-CM | POA: Diagnosis not present

## 2023-07-31 DIAGNOSIS — F32A Depression, unspecified: Secondary | ICD-10-CM | POA: Diagnosis present

## 2023-07-31 DIAGNOSIS — J690 Pneumonitis due to inhalation of food and vomit: Secondary | ICD-10-CM | POA: Diagnosis present

## 2023-07-31 LAB — GLUCOSE, CAPILLARY
Glucose-Capillary: 115 mg/dL — ABNORMAL HIGH (ref 70–99)
Glucose-Capillary: 147 mg/dL — ABNORMAL HIGH (ref 70–99)
Glucose-Capillary: 157 mg/dL — ABNORMAL HIGH (ref 70–99)
Glucose-Capillary: 80 mg/dL (ref 70–99)
Glucose-Capillary: 91 mg/dL (ref 70–99)
Glucose-Capillary: 93 mg/dL (ref 70–99)
Glucose-Capillary: 94 mg/dL (ref 70–99)
Glucose-Capillary: 94 mg/dL (ref 70–99)

## 2023-07-31 LAB — AMMONIA: Ammonia: 16 umol/L (ref 9–35)

## 2023-07-31 LAB — CBC
HCT: 30.8 % — ABNORMAL LOW (ref 39.0–52.0)
Hemoglobin: 9.5 g/dL — ABNORMAL LOW (ref 13.0–17.0)
MCH: 25.2 pg — ABNORMAL LOW (ref 26.0–34.0)
MCHC: 30.8 g/dL (ref 30.0–36.0)
MCV: 81.7 fL (ref 80.0–100.0)
Platelets: 552 10*3/uL — ABNORMAL HIGH (ref 150–400)
RBC: 3.77 MIL/uL — ABNORMAL LOW (ref 4.22–5.81)
RDW: 16.5 % — ABNORMAL HIGH (ref 11.5–15.5)
WBC: 10.6 10*3/uL — ABNORMAL HIGH (ref 4.0–10.5)
nRBC: 0 % (ref 0.0–0.2)

## 2023-07-31 LAB — POCT I-STAT 7, (LYTES, BLD GAS, ICA,H+H)
Acid-base deficit: 2 mmol/L (ref 0.0–2.0)
Bicarbonate: 22.2 mmol/L (ref 20.0–28.0)
Calcium, Ion: 1.26 mmol/L (ref 1.15–1.40)
HCT: 27 % — ABNORMAL LOW (ref 39.0–52.0)
Hemoglobin: 9.2 g/dL — ABNORMAL LOW (ref 13.0–17.0)
O2 Saturation: 96 %
Patient temperature: 98.9
Potassium: 3.6 mmol/L (ref 3.5–5.1)
Sodium: 139 mmol/L (ref 135–145)
TCO2: 23 mmol/L (ref 22–32)
pCO2 arterial: 34.5 mm[Hg] (ref 32–48)
pH, Arterial: 7.417 (ref 7.35–7.45)
pO2, Arterial: 80 mm[Hg] — ABNORMAL LOW (ref 83–108)

## 2023-07-31 LAB — BASIC METABOLIC PANEL
Anion gap: 10 (ref 5–15)
BUN: 13 mg/dL (ref 8–23)
CO2: 20 mmol/L — ABNORMAL LOW (ref 22–32)
Calcium: 9.1 mg/dL (ref 8.9–10.3)
Chloride: 108 mmol/L (ref 98–111)
Creatinine, Ser: 0.68 mg/dL (ref 0.61–1.24)
GFR, Estimated: >60 mL/min (ref >60)
Glucose, Bld: 89 mg/dL (ref 70–99)
Potassium: 4.2 mmol/L (ref 3.5–5.1)
Sodium: 138 mmol/L (ref 135–145)

## 2023-07-31 LAB — RAPID URINE DRUG SCREEN, HOSP PERFORMED
Amphetamines: NOT DETECTED
Barbiturates: NOT DETECTED
Benzodiazepines: NOT DETECTED
Cocaine: NOT DETECTED
Opiates: NOT DETECTED
Tetrahydrocannabinol: NOT DETECTED

## 2023-07-31 LAB — CORTISOL: Cortisol, Plasma: 6.7 ug/dL

## 2023-07-31 LAB — MAGNESIUM
Magnesium: 1.3 mg/dL — ABNORMAL LOW (ref 1.7–2.4)
Magnesium: 3 mg/dL — ABNORMAL HIGH (ref 1.7–2.4)
Magnesium: 2.4 mg/dL (ref 1.7–2.4)

## 2023-07-31 LAB — LACTIC ACID, PLASMA
Lactic Acid, Venous: 1.9 mmol/L (ref 0.5–1.9)
Lactic Acid, Venous: 1.4 mmol/L (ref 0.5–1.9)

## 2023-07-31 LAB — MRSA NEXT GEN BY PCR, NASAL: MRSA by PCR Next Gen: NOT DETECTED

## 2023-07-31 LAB — TSH: TSH: 3.155 u[IU]/mL (ref 0.350–4.500)

## 2023-07-31 MED ORDER — DOCUSATE SODIUM 100 MG PO CAPS: 100.0000 mg | ORAL_CAPSULE | Freq: Two times a day (BID) | ORAL | Status: DC | PRN

## 2023-07-31 MED ORDER — MAGNESIUM SULFATE 2 GM/50ML IV SOLN
2.0000 g | Freq: Once | INTRAVENOUS | Status: AC
  Administered 2023-07-31: 2 g via INTRAVENOUS
  Filled 2023-07-31: qty 50

## 2023-07-31 MED ORDER — CHLORHEXIDINE GLUCONATE CLOTH 2 % EX PADS
6.0000 | MEDICATED_PAD | Freq: Every day | CUTANEOUS | Status: DC
  Administered 2023-07-31 – 2023-08-08 (×9): 6 via TOPICAL

## 2023-07-31 MED ORDER — MAGNESIUM SULFATE 4 GM/100ML IV SOLN
4.0000 g | Freq: Once | INTRAVENOUS | Status: AC
  Administered 2023-07-31: 4 g via INTRAVENOUS
  Filled 2023-07-31: qty 100

## 2023-07-31 MED ORDER — ORAL CARE MOUTH RINSE: 15.0000 mL | OROMUCOSAL | Status: DC | PRN

## 2023-07-31 MED ORDER — FAMOTIDINE 20 MG PO TABS
20.0000 mg | ORAL_TABLET | Freq: Two times a day (BID) | ORAL | Status: DC
  Administered 2023-07-31 – 2023-08-08 (×16): 20 mg via ORAL
  Filled 2023-07-31 (×16): qty 1

## 2023-07-31 MED ORDER — ORAL CARE MOUTH RINSE
15.0000 mL | OROMUCOSAL | Status: DC | PRN
Start: 2023-07-31 — End: 2023-07-31

## 2023-07-31 MED ORDER — ENOXAPARIN SODIUM 40 MG/0.4ML IJ SOSY: 40.0000 mg | PREFILLED_SYRINGE | INTRAMUSCULAR | Status: DC

## 2023-07-31 MED ORDER — ATORVASTATIN CALCIUM 40 MG PO TABS
40.0000 mg | ORAL_TABLET | Freq: Every day | ORAL | Status: DC
Start: 2023-07-31 — End: 2023-07-31
  Administered 2023-07-31: 40 mg
  Filled 2023-07-31: qty 1

## 2023-07-31 MED ORDER — FENTANYL CITRATE PF 50 MCG/ML IJ SOSY
25.0000 ug | PREFILLED_SYRINGE | INTRAMUSCULAR | Status: DC | PRN
  Administered 2023-07-31: 50 ug via INTRAVENOUS

## 2023-07-31 MED ORDER — POTASSIUM CHLORIDE 20 MEQ PO PACK
40.0000 meq | PACK | Freq: Once | ORAL | Status: AC
  Administered 2023-07-31: 40 meq
  Filled 2023-07-31: qty 2

## 2023-07-31 MED ORDER — ATORVASTATIN CALCIUM 40 MG PO TABS
40.0000 mg | ORAL_TABLET | Freq: Every day | ORAL | Status: DC
  Administered 2023-07-31 – 2023-08-07 (×8): 40 mg via ORAL
  Filled 2023-07-31 (×8): qty 1

## 2023-07-31 MED ORDER — QUETIAPINE FUMARATE 25 MG PO TABS
50.0000 mg | ORAL_TABLET | Freq: Once | ORAL | Status: AC
  Administered 2023-07-31: 50 mg via ORAL
  Filled 2023-07-31: qty 2

## 2023-07-31 MED ORDER — POLYETHYLENE GLYCOL 3350 17 G PO PACK: 17.0000 g | PACK | Freq: Every day | ORAL | Status: DC | PRN

## 2023-07-31 MED ORDER — MEDIHONEY WOUND/BURN DRESSING EX PSTE
1.0000 | PASTE | Freq: Every day | CUTANEOUS | Status: DC
  Administered 2023-07-31 – 2023-08-07 (×8): 1 via TOPICAL
  Filled 2023-07-31 (×2): qty 44

## 2023-07-31 MED ORDER — ENOXAPARIN SODIUM 40 MG/0.4ML IJ SOSY
40.0000 mg | PREFILLED_SYRINGE | INTRAMUSCULAR | Status: DC
  Administered 2023-07-31 – 2023-08-08 (×9): 40 mg via SUBCUTANEOUS
  Filled 2023-07-31 (×9): qty 0.4

## 2023-07-31 MED ORDER — FAMOTIDINE 20 MG PO TABS
20.0000 mg | ORAL_TABLET | Freq: Two times a day (BID) | ORAL | Status: DC
  Administered 2023-07-31: 20 mg
  Filled 2023-07-31: qty 1

## 2023-07-31 MED ORDER — MIDAZOLAM HCL 2 MG/2ML IJ SOLN
2.0000 mg | INTRAMUSCULAR | Status: DC | PRN
  Administered 2023-07-31 (×3): 2 mg via INTRAVENOUS
  Filled 2023-07-31 (×4): qty 2

## 2023-07-31 MED ORDER — ORAL CARE MOUTH RINSE
15.0000 mL | OROMUCOSAL | Status: DC
  Administered 2023-07-31 – 2023-08-08 (×30): 15 mL via OROMUCOSAL

## 2023-07-31 MED ORDER — FENTANYL CITRATE PF 50 MCG/ML IJ SOSY: 25.0000 ug | PREFILLED_SYRINGE | INTRAMUSCULAR | Status: DC | PRN

## 2023-07-31 MED ORDER — IPRATROPIUM-ALBUTEROL 0.5-2.5 (3) MG/3ML IN SOLN: 3.0000 mL | RESPIRATORY_TRACT | Status: DC | PRN

## 2023-07-31 MED ORDER — FUROSEMIDE 10 MG/ML IJ SOLN
40.0000 mg | Freq: Two times a day (BID) | INTRAMUSCULAR | Status: DC
  Administered 2023-07-31 – 2023-08-07 (×15): 40 mg via INTRAVENOUS
  Filled 2023-07-31 (×15): qty 4

## 2023-07-31 MED ORDER — POLYETHYLENE GLYCOL 3350 17 G PO PACK
17.0000 g | PACK | Freq: Every day | ORAL | Status: DC
  Administered 2023-08-02 – 2023-08-08 (×7): 17 g via ORAL
  Filled 2023-07-31 (×8): qty 1

## 2023-07-31 MED ORDER — ORAL CARE MOUTH RINSE
15.0000 mL | OROMUCOSAL | Status: DC
  Administered 2023-07-31 (×4): 15 mL via OROMUCOSAL

## 2023-07-31 MED ORDER — MIDODRINE HCL 5 MG PO TABS
10.0000 mg | ORAL_TABLET | Freq: Three times a day (TID) | ORAL | Status: DC
  Administered 2023-07-31: 10 mg via ORAL
  Filled 2023-07-31: qty 2

## 2023-07-31 MED ORDER — POLYETHYLENE GLYCOL 3350 17 G PO PACK: 17.0000 g | PACK | Freq: Every day | ORAL | Status: DC

## 2023-07-31 MED ORDER — FREE WATER
100.0000 mL | Status: DC
  Administered 2023-07-31: 100 mL

## 2023-07-31 MED ORDER — GERHARDT'S BUTT CREAM
TOPICAL_CREAM | Freq: Three times a day (TID) | CUTANEOUS | Status: DC
  Administered 2023-07-31 – 2023-08-06 (×4): 1 via TOPICAL
  Filled 2023-07-31: qty 1

## 2023-07-31 MED ORDER — ACETAMINOPHEN 325 MG PO TABS
650.0000 mg | ORAL_TABLET | ORAL | Status: DC | PRN
Start: 2023-07-31 — End: 2023-08-02

## 2023-07-31 MED ORDER — HYDROCORTISONE SOD SUC (PF) 100 MG IJ SOLR
100.0000 mg | Freq: Two times a day (BID) | INTRAMUSCULAR | Status: DC
  Administered 2023-07-31 (×2): 100 mg via INTRAVENOUS
  Filled 2023-07-31 (×3): qty 2

## 2023-07-31 MED ORDER — FENTANYL 2500MCG IN NS 250ML (10MCG/ML) PREMIX INFUSION: 0.0000 ug/h | INTRAVENOUS | Status: DC

## 2023-07-31 MED ORDER — DOCUSATE SODIUM 50 MG/5ML PO LIQD: 100.0000 mg | Freq: Two times a day (BID) | ORAL | Status: DC | PRN

## 2023-07-31 MED ORDER — INSULIN ASPART 100 UNIT/ML IJ SOLN
0.0000 [IU] | INTRAMUSCULAR | Status: DC
  Administered 2023-07-31: 2 [IU] via SUBCUTANEOUS
  Administered 2023-07-31: 1 [IU] via SUBCUTANEOUS
  Administered 2023-08-01: 3 [IU] via SUBCUTANEOUS
  Administered 2023-08-01: 2 [IU] via SUBCUTANEOUS
  Administered 2023-08-01 (×2): 1 [IU] via SUBCUTANEOUS
  Administered 2023-08-01 – 2023-08-02 (×3): 2 [IU] via SUBCUTANEOUS
  Administered 2023-08-02: 5 [IU] via SUBCUTANEOUS
  Administered 2023-08-02: 3 [IU] via SUBCUTANEOUS
  Administered 2023-08-02: 2 [IU] via SUBCUTANEOUS
  Administered 2023-08-02: 3 [IU] via SUBCUTANEOUS
  Administered 2023-08-03: 7 [IU] via SUBCUTANEOUS
  Administered 2023-08-03: 3 [IU] via SUBCUTANEOUS
  Administered 2023-08-03: 2 [IU] via SUBCUTANEOUS
  Administered 2023-08-03: 1 [IU] via SUBCUTANEOUS
  Administered 2023-08-03: 5 [IU] via SUBCUTANEOUS

## 2023-07-31 MED ORDER — PIPERACILLIN-TAZOBACTAM 3.375 G IVPB
3.3750 g | Freq: Three times a day (TID) | INTRAVENOUS | Status: DC
  Administered 2023-07-31 – 2023-08-08 (×26): 3.375 g via INTRAVENOUS
  Filled 2023-07-31 (×27): qty 50

## 2023-07-31 NOTE — Consult Note (Addendum)
WOC Nurse Consult Note:  this patient is well known to WOC team from previous admissions; had amputation R 5th toe 07/02/2023  This consult is performed remotely utilizing EMR records; WOC team last saw these wounds in person 07/20/2023 Reason for Consult: wounds, ostomy  Wound type: 1.  Full thickness post amputation R 5th toe/R plantar foot  2.  Chronic Stage 4 Pressure Injury Sacrum and R ischium    Pressure Injury POA: Yes Measurement: see nursing flowsheet  Measurements 07/20/2023  1.  R 5th toe/plantar foot 6 cm x 3 cm incision with intact sutures  2.  R ischium Stage 4 PI 3 cm x 2 cm x 1 cm 50% yellow slough 50% pink moist 3.  Sacrum Stage 4 PI 7 cm x 7 cm x 0.1 cm 50% pink moist 50% yellow; noted some dark hemorrhagic/? Developing eschar superior to R ischial wound  Wound bed: as above  Drainage (amount, consistency, odor) minimal tan drainage from ischium/sacrum  Periwound: erythema and chronic tissue damage to buttocks/coccyx/sacrum  Dressing procedure/placement/frequency:  Clean plantar foot intact sutures with NS, apply Xeroform to incision site daily, cover with dry gauze  and kerlix or silicone foam.  Clean Sacral and Ischial wounds with Vashe wound cleanser [name] [phone]), apply Medihoney to wound beds daily, cover with dry gauze making sure to pack into any areas of depth. Apply Gerhardt's Butt Cream to surrounding skin of sacrum/coccyx/buttocks 3 times daily and prn soiling. May cover with ABD pad or silicone foams whichever is preferred.    Patient should remain on a low air loss mattress for pressure redistribution and moisture management throughout hospitalization.    WOC Nurse ostomy consult note; patient with established ostomy  Stoma type/location: LLQ colostomy  Stomal assessment/size: 1 3/4" per last evaluation  Peristomal assessment:  not assessed  Treatment options for stomal/peristomal skin: 2" barrier ring  Output small amount of brown stool  Ostomy pouching:   2 piece 2 3/4" skin barrier [name] #2), 2 3/4" pouch [name] [phone]) and 2" barrier ring [name] [phone])    Education provided: none, patient resides at snf  Enrolled patient in [facility] program: no established ostomy    WOC team will not follow this ostomy.  Orders placed with [name] numbers.    Thank you,     Priscella Mann MSN, RN-BC, [employer] [phone]

## 2023-07-31 NOTE — Progress Notes (Signed)
[facility] ADULT ICU REPLACEMENT PROTOCOL   The patient does apply for the [facility] Adult ICU Electrolyte Replacment Protocol based on the criteria listed below:   1.Exclusion criteria: TCTS, ECMO, Dialysis, and Myasthenia Gravis patients 2. Is GFR >/= 30 ml/min? Yes.    Patient's GFR today is >60 3. Is SCr </= 2? Yes.   Patient's SCr is 0.60 mg/dL 4. Did SCr increase >/= 0.5 in 24 hours? No. 5.Pt's weight >40kg  Yes.   6. Abnormal electrolyte(s): Mag 1.3  7. Electrolytes replaced per protocol 8.  Call MD STAT for K+ </= 2.5, Phos </= 1, or Mag </= 1 Physician:  Dr. Wallie Char, Lilia Argue 07/31/2023 2:18 AM

## 2023-07-31 NOTE — Progress Notes (Addendum)
eLink Physician-Brief Progress Note Patient Name: Brent [name]. DOB: 08/12/1946 MRN: [id number]   Date of Service  07/31/2023  HPI/Events of Note  Bed side RN, Kenard Gower asking to evaluate if he can be shifted to Progressive bed in 3W, to make room for Trauma patient in ED.  Chart, labs, notes reviewed Camera eval done also.  S/P extubation since AM. Mental status improved. VS stable. Not on pressors. Able to protect his airways.  Hx of septic arthritis on antibiotics. CHF/effusion. Olan for thoracentesis in AM.   eICU Interventions  Get ABG, if ok, would be stable to go to 3W.     Intervention Category Intermediate Interventions: Other:  Ranee Gosselin 07/31/2023, 9:54 PM  22:20  7.41/34/80/23.   Will place order to go to 3 W .

## 2023-07-31 NOTE — Progress Notes (Signed)
NAME:  Brent [name]., MRN:  [id number], DOB:  1945-12-04, LOS: 0 ADMISSION DATE:  07/30/2023, CONSULTATION DATE:  11/10 REFERRING MD:  Dr. Lynelle Doctor, CHIEF COMPLAINT:  AMS   History of Present Illness:  Patient is a 77 year old male with pertinent PMH quadriparesis/quadriplegia from transverse myelitis, HFpEF, hx of osteomyelitis of right fifth metatarsal and septic arthritis of fifth MTP joint on zosyn until 08/14/2023, GI fistula s/p wedge resection 05/2023, neurogenic bladder with suprapubic catheter, DMT2, htn, hld, asthma presents to [facility] ED on 11/10 with AMS.  Patient recently hospitalized on 10/11-10/17 w/ right foot osteomyelitis. OR cultures growing. Providencia/Proteus/E faecalis/Bacteroides. Scheduled to be on 6 weeks of IV Zosyn per ID until 08/14/2023.  Had a multiple admissions in October due to sob from pleural effusions treated from chf treated w/ lasix. Recently discharged on 11/7 to [facility].   On 11/10 patient admitted to [facility] with AMS.  Per EMS was agitated earlier in the morning but not giving any medications.  Patient later in the evening found poorly responsive and EMS called.  Noted to be bradycardic 50s and hypoxic requiring [place]. CBG 58 given dextrose.  Patient remained altered. Patient given IV fluids and atropine for bradycardia.  Transferred to [facility] Temp 95 F.  Cultures obtained and started on broad-spectrum antibiotics.  Given unresponsiveness patient intubated for airway protection.  CXR with layering left-sided pleural effusion.  CT head no acute abnormality. ABG WNL. UA w/ small leukocytes. LA 2.5 and wbc 9. PCCM consulted for icu admission.   Pertinent  Medical History   Past Medical History:  Diagnosis Date   Acute osteomyelitis of metatarsal bone of right foot (HCC) 07/01/2023   Allergy    Aortic atherosclerosis (HCC) 07/01/2023   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 angioplasty of his RCA in  1990. b. bare metal stent placed in the RCA in 2000 followed by rotational atherectomy shortly after for stent restenosis. c. last cath was in 2012 showing stable moderate diffuse CAD. (70% mid LAD, 80% diagonal, 70% Ramus, 40% mid to distal RCA stent restenosis). d. Low risk nuc in 2015.   Diabetes mellitus    Diverticulosis    Elevated CK    Erectile dysfunction    Hemorrhoids    HTN (hypertension)    Hyperlipidemia    Hypertriglyceridemia    Malignant melanoma of left side of neck (HCC) 10/25/2018   Myocardial infarction (HCC)    Obesity    OSA (obstructive sleep apnea)    Persistent disorder of initiating or maintaining sleep    Personal history of colonic polyps 02/05/2003   Renal lesion 06/20/2016   Septic arthritis of foot (HCC) 07/01/2023   Stage IV decubitus ulcer (HCC) 06/30/2023     Significant Hospital Events: Including procedures, antibiotic start and stop dates in addition to other pertinent events   11/11 admit to [facility] w/ AMS requiring intubation for gcs 1  Interim History / Subjective:  Patient was restarted back on vasopressor support with Levophed His temperature has normalized Now he is awake, following commands, tolerating spontaneous breathing trial Sedation was stopped  Objective   Blood pressure (!) 123/45, pulse (!) 54, temperature (!) 97.5 F (36.4 C), temperature source Oral, resp. rate 18, height 6\' 1"  (1.854 m), weight 92.4 kg, SpO2 100%.    Vent Mode: PRVC FiO2 (%):  [40 %-100 %] 40 % Set Rate:  [18 bmp] 18 bmp Vt Set:  [550  mL-620 mL] 620 mL PEEP:  [5 cmH20] 5 cmH20 Plateau Pressure:  [17 cmH20-20 cmH20] 20 cmH20   Intake/Output Summary (Last 24 hours) at 07/31/2023 0758 Last data filed at 07/31/2023 0700 Gross per 24 hour  Intake 918.32 ml  Output 400 ml  Net 518.32 ml   Filed Weights   07/30/23 2204 07/31/23 0700  Weight: 90.3 kg 92.4 kg    Examination: General: Crtitically ill-appearing elderly male, orally intubated HEENT:  [place]/AT, eyes anicteric.  ETT and OGT in place Neuro: Eyes open, following commands, moving all 4 extremities Chest: Bilateral fine crackles, reduced air entry on left base Heart: Regular rate and rhythm, no murmurs or gallops Abdomen: Soft, nondistended, bowel sounds present Skin: Stage IV sacral decubitus ulcer, POA  Labs and images reviewed  Resolved Hospital Problem list     Assessment & Plan:  Acute encephalopathy of unclear etiology Per report patient was agitated in the afternoon unsure if he received any medications for agitation followed by being unresponsive He may have relative adrenal insufficiency considering he was bradycardic, hypotensive, hypoglycemic and hypoxic Sedation was stopped, now he is following commands CT head was negative for acute findings TSH is normal Will hold off on MRI and EEG as patient is awake and following commands  Hold Lamictal, trazodone, benzodiazepines and Cymbalta  Possible relative adrenal insufficiency Check cortisol Started on hydrocortisone 100 mg every 12 hours  Acute respiratory failure with hypoxia Possible aspiration pneumonia Recurrent Left Pleural Effusion Continue lung protective ventilation VAP prevention bundle in place PAD protocol, sedation was stopped He is tolerating spontaneous breathing trial He is on antibiotic with Zosyn CT chest is suggestive of moderate left-sided pleural effusion, will try to do thoracentesis  Sepsis, POA History of osteomyelitis of the right fifth metatarsal/septic arthritis of fifth MTP joint Possible UTI Currently on IV Zosyn for 6 weeks end date of 08/14/2023. Continue zosyn Lactate has cleared Follow-up cultures  Diastolic heart failure was ruled out, echocardiogram last month showing normal EF with no diastolic dysfunction  Sinus bradycardia Upon mission patient was bradycardic with heart rate in 40s, he was hypothermic TSH is normal Checking cortisol As he woke up his heart rate  is in 70s to 80s  Hypomagnesemia Prolonged qtc Continue aggressive electrolyte replacement  HTN/HLD Continue statin Holding antihypertensive meds  DMT2 Last hemoglobin A1c was 7.8 Continue sliding scale insulin with CBG goal 140-180 Hold long-acting insulin for now  Chronic anemia H&H is stable Closely monitor and transfuse if less than 7  Paraplegia secondary to transverse myelitis Neurogenic bladder s/p suprapubic catheter Hold baclofen  Stage IV decubitus ulcer POA Ischial decubitus ulcer POA Wound care consulted  Depression/anxiety Hold Cymbalta, trazodone  History of gastrocolic fistula S/p colostomy Continue colostomy care  Best Practice (right click and "Reselect all SmartList Selections" daily)   Diet/type: NPO w/ meds via tube DVT prophylaxis: LMWH GI prophylaxis: H2B Lines: N/A Foley:  Yes, and it is still needed; suprapubic catheter Code Status:  full code Last date of multidisciplinary goals of care discussion [11/11 spoke w/ wife [name] over phone]  Labs   CBC: Recent Labs  Lab 07/30/23 2207 07/30/23 2242 07/30/23 2303 07/31/23 0700  WBC 9.0  --   --  10.6*  NEUTROABS 6.9  --   --   --   HGB 9.4* 11.2*  10.9* 9.2* 9.5*  HCT 32.4* 33.0*  32.0* 27.0* 30.8*  MCV 83.7  --   --  81.7  PLT 410*  --   --  552*    Basic Metabolic Panel: Recent Labs  Lab 07/25/23 1130 07/27/23 0457 07/30/23 2207 07/30/23 2242 07/30/23 2303 07/31/23 0100  NA 133* 137 139 139  139 139  --   K 3.3* 4.3 3.6 3.6  3.6 3.5  --   CL 95* 98 105 103  --   --   CO2 28 26 22   --   --   --   GLUCOSE 281* 152* 131* 123*  --   --   BUN 19 12 13 14   --   --   CREATININE 0.93 0.72 0.65 0.60*  --   --   CALCIUM 8.5* 9.1 9.4  --   --   --   MG  --   --   --   --   --  1.3*   GFR: Estimated Creatinine Clearance: 86 mL/min (A) (by C-G formula based on SCr of 0.6 mg/dL (L)). Recent Labs  Lab 07/30/23 2207 07/30/23 2242 07/31/23 0100 07/31/23 0700  WBC 9.0  --   --   10.6*  LATICACIDVEN  --  2.5* 1.9 1.4    Liver Function Tests: Recent Labs  Lab 07/30/23 2207  AST 54*  ALT 50*  ALKPHOS 86  BILITOT 0.3  PROT 6.1*  ALBUMIN 2.7*   No results for input(s): "LIPASE", "AMYLASE" in the last 168 hours. Recent Labs  Lab 07/31/23 0100  AMMONIA 16    ABG    Component Value Date/Time   PHART 7.430 07/30/2023 2303   PCO2ART 32.5 07/30/2023 2303   PO2ART 246 (H) 07/30/2023 2303   HCO3 21.9 07/30/2023 2303   TCO2 23 07/30/2023 2303   ACIDBASEDEF 2.0 07/30/2023 2303   O2SAT 100 07/30/2023 2303     Coagulation Profile: Recent Labs  Lab 07/30/23 2207  INR 1.1    Cardiac Enzymes: No results for input(s): "CKTOTAL", "CKMB", "CKMBINDEX", "TROPONINI" in the last 168 hours.  HbA1C: Hgb A1c MFr Bld  Date/Time Value Ref Range Status  06/06/2023 02:36 AM 7.5 (H) 4.8 - 5.6 % Final    Comment:    (NOTE) Pre diabetes:          5.7%-6.4%  Diabetes:              >6.4%  Glycemic control for   <7.0% adults with diabetes   05/22/2023 03:44 AM 8.0 (H) 4.8 - 5.6 % Final    Comment:    (NOTE) Pre diabetes:          5.7%-6.4%  Diabetes:              >6.4%  Glycemic control for   <7.0% adults with diabetes     CBG: Recent Labs  Lab 07/30/23 2202 07/31/23 0136 07/31/23 0239 07/31/23 0358 07/31/23 0727  GLUCAP 122* 94 80 91 94   The patient is critically ill due to acute encephalopathy/acute respiratory failure with hypoxia critical care was necessary to treat or prevent imminent or life-threatening deterioration.  Critical care was time spent personally by me on the following activities: development of treatment plan with patient and/or surrogate as well as nursing, discussions with consultants, evaluation of patient's response to treatment, examination of patient, obtaining history from patient or surrogate, ordering and performing treatments and interventions, ordering and review of laboratory studies, ordering and review of radiographic  studies, pulse oximetry, re-evaluation of patient's condition and participation in multidisciplinary rounds.   During this encounter critical care time was devoted to patient care services described in this note  for 35 minutes.     Cheri Fowler, MD [facility] See Amion for pager If no response to pager, please call [phone] until 7pm After 7pm, Please call E-link [phone]

## 2023-07-31 NOTE — ED Notes (Signed)
Total 1800cc NS given for fluid resuscitation. Stop per admission PA/ Pulmonology MD. Keep IV KVO.

## 2023-07-31 NOTE — Progress Notes (Addendum)
Pharmacy Antibiotic Note  Brent [name]. is a 77 y.o. male admitted on 07/30/2023 with osteomyelitis.  Pharmacy has been consulted for Zosyn dosing.  Pt has been on Zosyn as outpt with planned end date of 11/25.  Plan: Rec'd cefepime and metronidazole in ED. Zosyn 3.375g IV q8h (4-hour infusion).  Height: 6\' 1"  (185.4 cm) Weight: 90.3 kg (199 lb) IBW/kg (Calculated) : 79.9  Temp (24hrs), Avg:95.4 F (35.2 C), Min:95.1 F (35.1 C), Max:95.6 F (35.3 C)  Recent Labs  Lab 07/25/23 1130 07/27/23 0457 07/30/23 2207 07/30/23 2242  WBC  --   --  9.0  --   CREATININE 0.93 0.72 0.65 0.60*  LATICACIDVEN  --   --   --  2.5*    Estimated Creatinine Clearance: 86 mL/min (A) (by C-G formula based on SCr of 0.6 mg/dL (L)).    Allergies  Allergen Reactions   Cipro [Ciprofloxacin Hcl] Other (See Comments)    Hallucinations Allergy not listed on MAR   Neurontin [Gabapentin] Other (See Comments)    Unknown reaction   Levaquin [Levofloxacin] Rash     Thank you for allowing pharmacy to be a part of this patient's care.  Vernard Gambles, PharmD, BCPS  07/31/2023 12:48 AM

## 2023-07-31 NOTE — Progress Notes (Signed)
ETT advanced 3cm per MD without complications. Volumes are consistent. RT will followup as needed along with a CXR.

## 2023-07-31 NOTE — TOC Initial Note (Signed)
Transition of Care (TOC) - Initial/Assessment Note    Patient Details  Name: Brent [name]. MRN: [id number] Date of Birth: [birth year]  Transition of Care [facility]) CM/SW Contact:    Mearl Latin, LCSW Phone Number: 07/31/2023, 9:18 AM  Clinical Narrative:                 Patient was admitted from [facility] SNF long term care and was extubated today. CSW will continue to follow.     Expected Discharge Plan: Skilled Nursing Facility Barriers to Discharge: Continued Medical Work up   Patient Goals and CMS Choice Patient states their goals for this hospitalization and ongoing recovery are:: Return to SNF          Expected Discharge Plan and Services In-house Referral: Clinical Social Work   Post Acute Care Choice: Skilled Nursing Facility Living arrangements for the past 2 months: Skilled Nursing Facility                                      Prior Living Arrangements/Services Living arrangements for the past 2 months: Skilled Nursing Facility Lives with:: Facility Resident Patient language and need for interpreter reviewed:: Yes Do you feel safe going back to the place where you live?: Yes      Need for Family Participation in Patient Care: Yes (Comment) Care giver support system in place?: Yes (comment)   Criminal Activity/Legal Involvement Pertinent to Current Situation/Hospitalization: No - Comment as needed  Activities of Daily Living      Permission Sought/Granted Permission sought to share information with : Facility Medical sales representative, Family Supports Permission granted to share information with : No  Share Information with NAME: [name]  Permission granted to share info w AGENCY: [place]  Permission granted to share info w Relationship: Spouse  Permission granted to share info w Contact Information: [phone]  Emotional Assessment Appearance:: Appears stated age Attitude/Demeanor/Rapport: Unable to Assess Affect (typically observed):  Unable to Assess Orientation: : Oriented to Self Alcohol / Substance Use: Not Applicable Psych Involvement: No (comment)  Admission diagnosis:  Acute encephalopathy [G93.40] Altered mental status, unspecified altered mental status type [R41.82] Patient Active Problem List   Diagnosis Date Noted   Acute encephalopathy 07/31/2023   Acute respiratory failure (HCC) 07/31/2023   Bilateral pleural effusion 07/26/2023   Acute confusional state 07/26/2023   Pleural effusion 07/19/2023   Leukocytosis 07/19/2023   Sinus bradycardia 07/19/2023   (HFpEF) heart failure with preserved ejection fraction (HCC) 07/14/2023   History of osteomyelitis 07/14/2023   GERD (gastroesophageal reflux disease) 07/14/2023   Diabetic foot ulcer (HCC) 07/01/2023   Acute osteomyelitis of metatarsal bone of right foot (HCC) 07/01/2023   Septic arthritis of foot (HCC) 07/01/2023   Colostomy present (HCC) 07/01/2023   DM type 2 (diabetes mellitus, type 2) (HCC) 07/01/2023   Aortic atherosclerosis (HCC) 07/01/2023   Pressure ulcer 07/01/2023   Stage IV decubitus ulcer (HCC) 06/30/2023   Urine leukocytes 06/30/2023   SIRS (systemic inflammatory response syndrome) (HCC) 06/05/2023   Suprapubic catheter (HCC) 06/05/2023   Dehydration 02/21/2023   Sepsis (HCC) 02/21/2023   Acute metabolic encephalopathy 02/21/2023   Lactic acidosis 02/21/2023   Insulin dependent type 2 diabetes mellitus (HCC) 02/21/2023   Normocytic anemia 02/21/2023   Primary malignant neuroendocrine tumor of stomach (HCC) 05/27/2022   Preoperative examination 05/27/2022   Abnormal endoscopy of upper gastrointestinal tract 05/27/2022   Benign neuroendocrine  tumor of stomach  04/04/2022   Mucosal abnormality of stomach    Chronic osteomyelitis of pelvic region, right (HCC) 03/25/2022   External gastrointestinal fistula site infection 03/25/2022   Complicated urinary tract infection    Catheter-associated urinary tract infection (HCC) 08/06/2021    Osteomyelitis of pelvic region, acute, right (HCC) 03/24/2021   Right ischial pressure sore 02/10/2021   Stage IV pressure ulcer of sacral region (HCC) 12/22/2020   Hypokalemia    Encounter for monitoring immunomodulating therapy 08/20/2020   High risk medication use 08/20/2020   Malnutrition of moderate degree 06/04/2020   Palliative care by specialist    Goals of care, counseling/discussion    FTT (failure to thrive) in adult 06/01/2020   Abdominal pain    Sacral ulcer, with necrosis of muscle (HCC) 04/09/2020   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HCC)    Transverse myelitis (HCC)    Labile blood pressure    Labile blood glucose    Neurogenic bladder    Neurogenic bowel    Transaminitis    Controlled type 2 diabetes mellitus with hyperglycemia, with long-term current use of insulin (HCC)    Quadriplegia (HCC)    Dyslipidemia    Paraplegia (HCC)    Coronary artery disease involving native coronary artery of native heart without angina pectoris    Steroid-induced hyperglycemia    Diabetic peripheral neuropathy (HCC)    Weakness 03/09/2020   Chest pain 03/08/2020   Right leg numbness    Malignant melanoma of left side of neck (HCC) 10/25/2018   Facial neuritis 10/25/2018   Myofascial pain syndrome 10/25/2018   Occipital neuralgia of left side 10/25/2018   Male hypogonadism 06/20/2016   Renal lesion 06/20/2016   Insomnia 08/07/2015   Enlarged prostate with lower urinary tract symptoms (LUTS) 11/07/2014   Balanitis 07/30/2012   Bladder neck obstruction 07/30/2012   Prostate nodule 06/18/2012   Recurrent nephrolithiasis 06/18/2012   Benign neoplasm of colon 08/29/2011   DEPRESSION, SITUATIONAL, ACUTE 01/23/2010   Essential hypertension 05/30/2009   Coronary atherosclerosis 05/30/2009   Obstructive sleep apnea 11/28/2008   OBESITY 09/23/2008   ERECTILE DYSFUNCTION 11/26/2007   Well controlled type 2 diabetes mellitus with peripheral circulatory disorder (HCC) 05/09/2007    Hyperlipidemia 05/09/2007   MYOCARDIAL INFARCTION, HX OF 05/09/2007   DIVERTICULOSIS, COLON 05/09/2007   PCP:  [name], MD Pharmacy:  No Pharmacies Listed    Social Determinants of Health (SDOH) Social History: SDOH Screenings   Food Insecurity: No Food Insecurity (07/19/2023)  Housing: Low Risk  (07/19/2023)  Transportation Needs: No Transportation Needs (07/19/2023)  Utilities: Not At Risk (07/19/2023)  Depression (PHQ2-9): Low Risk  (12/09/2020)  Financial Resource Strain: Low Risk  (03/06/2020)  Physical Activity: Inactive (12/14/2018)  Social Connections: Unknown (12/14/2018)  Stress: No Stress Concern Present (12/14/2018)  Tobacco Use: Medium Risk (07/30/2023)   SDOH Interventions:     Readmission Risk Interventions    07/31/2023    9:17 AM 07/02/2023   12:31 PM  Readmission Risk Prevention Plan  Transportation Screening Complete Complete  Medication Review Oceanographer) Complete Complete  PCP or Specialist appointment within 3-5 days of discharge Complete Not Complete  PCP/Specialist Appt Not Complete comments  SNF resident  HRI or Home Care Consult Complete Complete  SW Recovery Care/Counseling Consult Complete Complete  Palliative Care Screening Not Applicable Not Applicable  Skilled Nursing Facility Complete Complete

## 2023-07-31 NOTE — Procedures (Signed)
Extubation Procedure Note  Patient Details:   Name: Brent [name]. DOB: [birth year] MRN: [id number]   Airway Documentation:    Vent end date: 07/31/23 Vent end time: 0912   Evaluation  O2 sats: stable throughout Complications: No apparent complications Patient did tolerate procedure well. Bilateral Breath Sounds: Clear, Diminished    Patient extubated per written order with RT and RN at bedside. Cuff leak heard prior to extubation, no stridor noted post. Patient was placed on 3L [place] for comfort, tolerating well at this time. Patient able to speak his name and was aware of time and place post extubation.       Yes  Kimberly Nieland Remonia Richter 07/31/2023, 9:16 AM

## 2023-07-31 NOTE — Progress Notes (Addendum)
eLink Physician-Brief Progress Note Patient Name: Brent [name]. DOB: 12-14-1945 MRN: [id number]   Date of Service  07/31/2023  HPI/Events of Note  77 year old male admitted from his rehab facility with altered mental status.  He was noted to be hypothermic, bradycardic, hypoglycemic and hypotensive.  He was intubated in the emergency department for airway protection.  Started on vasopressors and admitted to the ICU.  By the time I am seeing him, he is off vasopressors.  eICU Interventions  Patient's chart reviewed.  Pertinent labs and imaging studies reviewed.  Video assessment of patient done.  He is agitated and moving all extremities.  He is reaching for his endotracheal tube.  Ostomy in suprapubic catheter in place. Impression:  Acute toxic and metabolic encephalopathy Acute respiratory failure requiring intubation and mechanical ventilation Severe sepsis  Will order fentanyl infusion and as needed midazolam for vent synchrony.  Order restraints to prevent self extubation. Broad-spectrum antibiotics. Daily assessment for extubation. Case discussed with bedside nursing staff.     Intervention Category Evaluation Type: New Patient Evaluation  Carilyn Goodpasture 07/31/2023, 1:45 AM  Addendum: Patient remains on norepinephrine at a low dose of 3 mcg/min.  Blood pressure is 121/38.  Heart rate remains low but is in the 40s.  He has had no deterioration.  Will continue management without any changes. 5:13 AM

## 2023-07-31 NOTE — Telephone Encounter (Signed)
Shanda Bumps, Nurse from [facility], requested to cancel upcoming appointment due to patient's hospitalization. Facility declined rescheduling but wanted to make Dr. Thedore Mins aware of hospital stay due to PICC line.   Shanda Bumps of [facility] [phone]

## 2023-08-01 ENCOUNTER — Inpatient Hospital Stay: Payer: Medicare Other | Admitting: Internal Medicine

## 2023-08-01 DIAGNOSIS — G9340 Encephalopathy, unspecified: Secondary | ICD-10-CM | POA: Diagnosis not present

## 2023-08-01 DIAGNOSIS — J9601 Acute respiratory failure with hypoxia: Secondary | ICD-10-CM | POA: Diagnosis not present

## 2023-08-01 LAB — URINE CULTURE: Culture: NO GROWTH

## 2023-08-01 LAB — GLUCOSE, CAPILLARY
Glucose-Capillary: 133 mg/dL — ABNORMAL HIGH (ref 70–99)
Glucose-Capillary: 124 mg/dL — ABNORMAL HIGH (ref 70–99)
Glucose-Capillary: 169 mg/dL — ABNORMAL HIGH (ref 70–99)
Glucose-Capillary: 164 mg/dL — ABNORMAL HIGH (ref 70–99)
Glucose-Capillary: 209 mg/dL — ABNORMAL HIGH (ref 70–99)

## 2023-08-01 MED ORDER — LORAZEPAM 0.5 MG PO TABS
0.5000 mg | ORAL_TABLET | Freq: Two times a day (BID) | ORAL | Status: DC | PRN
  Administered 2023-08-01 – 2023-08-03 (×5): 0.5 mg via ORAL
  Filled 2023-08-01 (×5): qty 1

## 2023-08-01 MED ORDER — HYDROCORTISONE SOD SUC (PF) 100 MG IJ SOLR
50.0000 mg | Freq: Two times a day (BID) | INTRAMUSCULAR | Status: DC
Start: 2023-08-01 — End: 2023-08-03
  Administered 2023-08-01 – 2023-08-03 (×5): 50 mg via INTRAVENOUS
  Filled 2023-08-01 (×6): qty 1

## 2023-08-01 MED ORDER — OXYCODONE HCL 5 MG PO TABS
5.0000 mg | ORAL_TABLET | Freq: Two times a day (BID) | ORAL | Status: AC | PRN
  Administered 2023-08-01 – 2023-08-04 (×6): 5 mg via ORAL
  Filled 2023-08-01 (×6): qty 1

## 2023-08-01 MED ORDER — ONDANSETRON HCL 4 MG/2ML IJ SOLN
4.0000 mg | Freq: Three times a day (TID) | INTRAMUSCULAR | Status: DC | PRN
  Administered 2023-08-01 – 2023-08-07 (×4): 4 mg via INTRAVENOUS
  Filled 2023-08-01 (×4): qty 2

## 2023-08-01 NOTE — Plan of Care (Signed)
  Problem: Coping: Goal: Ability to adjust to condition or change in health will improve Outcome: Progressing   Problem: Fluid Volume: Goal: Ability to maintain a balanced intake and output will improve Outcome: Progressing   Problem: Metabolic: Goal: Ability to maintain appropriate glucose levels will improve Outcome: Progressing   Problem: Nutritional: Goal: Maintenance of adequate nutrition will improve Outcome: Progressing Goal: Progress toward achieving an optimal weight will improve Outcome: Progressing   Problem: Skin Integrity: Goal: Risk for impaired skin integrity will decrease Outcome: Progressing   Problem: Tissue Perfusion: Goal: Adequacy of tissue perfusion will improve Outcome: Progressing   Problem: Clinical Measurements: Goal: Respiratory complications will improve Outcome: Progressing Goal: Cardiovascular complication will be avoided Outcome: Progressing

## 2023-08-01 NOTE — Progress Notes (Signed)
Patient arrived to unit alert to self placed on wall monitor stated that he had no pain at this time.

## 2023-08-01 NOTE — Progress Notes (Signed)
PT Cancellation Note  Patient Details Name: Brent [name]. MRN: [id number] DOB: May 06, 1946   Cancelled Treatment:    Reason Eval/Treat Not Completed: PT screened, no needs identified, will sign off  Spoke with patient. He is a long-term care resident. He is lifted out of bed via hoyer lift and uses a power wheelchair that he can drive himself. He reports he is at baseline for his mobility. Agrees he has no acute changes for mobility or PT needs.    Jerolyn Center, PT Acute Rehabilitation Services  Office [phone]   Zena Amos 08/01/2023, 11:31 AM

## 2023-08-01 NOTE — Progress Notes (Signed)
NAME:  Brent Marlatt., MRN:  [id number], DOB:  10/19/1945, LOS: 1 ADMISSION DATE:  07/30/2023, CONSULTATION DATE:  11/10 REFERRING MD:  Dr. Lynelle Doctor, CHIEF COMPLAINT:  AMS   History of Present Illness:  Patient is a 77 year old male with pertinent PMH quadriparesis/quadriplegia from transverse myelitis, HFpEF, hx of osteomyelitis of right fifth metatarsal and septic arthritis of fifth MTP joint on zosyn until 08/14/2023, GI fistula s/p wedge resection 05/2023, neurogenic bladder with suprapubic catheter, DMT2, htn, hld, asthma presents to [facility] ED on 11/10 with AMS.  Patient recently hospitalized on 10/11-10/17 w/ right foot osteomyelitis. OR cultures growing. Providencia/Proteus/E faecalis/Bacteroides. Scheduled to be on 6 weeks of IV Zosyn per ID until 08/14/2023.  Had a multiple admissions in October due to sob from pleural effusions treated from chf treated w/ lasix. Recently discharged on 11/7 to [facility].   On 11/10 patient admitted to [facility] with AMS.  Per EMS was agitated earlier in the morning but not giving any medications.  Patient later in the evening found poorly responsive and EMS called.  Noted to be bradycardic 50s and hypoxic requiring [place]. CBG 58 given dextrose.  Patient remained altered. Patient given IV fluids and atropine for bradycardia.  Transferred to [facility]. Temp 95 F.  Cultures obtained and started on broad-spectrum antibiotics.  Given unresponsiveness patient intubated for airway protection.  CXR with layering left-sided pleural effusion.  CT head no acute abnormality. ABG WNL. UA w/ small leukocytes. LA 2.5 and wbc 9. PCCM consulted for icu admission.   Pertinent  Medical History   Past Medical History:  Diagnosis Date   Acute osteomyelitis of metatarsal bone of right foot (HCC) 07/01/2023   Allergy    Aortic atherosclerosis (HCC) 07/01/2023   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 angioplasty of his RCA in  1990. b. bare metal stent placed in the RCA in 2000 followed by rotational atherectomy shortly after for stent restenosis. c. last cath was in 2012 showing stable moderate diffuse CAD. (70% mid LAD, 80% diagonal, 70% Ramus, 40% mid to distal RCA stent restenosis). d. Low risk nuc in 2015.   Diabetes mellitus    Diverticulosis    Elevated CK    Erectile dysfunction    Hemorrhoids    HTN (hypertension)    Hyperlipidemia    Hypertriglyceridemia    Malignant melanoma of left side of neck (HCC) 10/25/2018   Myocardial infarction (HCC)    Obesity    OSA (obstructive sleep apnea)    Persistent disorder of initiating or maintaining sleep    Personal history of colonic polyps 02/05/2003   Renal lesion 06/20/2016   Septic arthritis of foot (HCC) 07/01/2023   Stage IV decubitus ulcer (HCC) 06/30/2023     Significant Hospital Events: Including procedures, antibiotic start and stop dates in addition to other pertinent events   11/11 admit to [facility] w/ AMS requiring intubation for gcs 1  Interim History / Subjective:  Patient was successfully extubated yesterday, currently on room air Mental status has improved Complaining of nausea this morning  Objective   Blood pressure (!) 148/52, pulse 73, temperature 97.9 F (36.6 C), temperature source Oral, resp. rate 18, height 6\' 1"  (1.854 m), weight 93 kg, SpO2 98%.        Intake/Output Summary (Last 24 hours) at 08/01/2023 1037 Last data filed at 08/01/2023 0000 Gross per 24 hour  Intake 219.75 ml  Output 1900 ml  Net -  1680.25 ml   Filed Weights   07/30/23 2204 07/31/23 0700 08/01/23 0716  Weight: 90.3 kg 92.4 kg 93 kg    Examination: Physical exam: General: Elderly male, lying on the bed HEENT: Max/AT, eyes anicteric.  moist mucus membranes Neuro: Alert, awake following commands Chest: Coarse breath sounds, no wheezes or rhonchi Heart: Bradycardic, regular rhythm, no murmurs or gallops Abdomen: Soft, nontender, nondistended, bowel  sounds present Skin: Stage IV sacral decubitus ulcer, POA  Labs and images reviewed  Resolved Hospital Problem list     Assessment & Plan:  Acute encephalopathy of unclear etiology but likely due to polypharmacy Per report patient was agitated at nursing home unsure if he received any medications for agitation followed by being unresponsive He may have relative adrenal insufficiency considering he was bradycardic, hypotensive, hypoglycemic and hypoxic His mental status is improved significantly CT head was negative for acute findings TSH is normal Hold Lamictal, trazodone, benzodiazepines and Cymbalta Resume slowly  Possible relative adrenal insufficiency This morning serum creatinine cortisol is low normal considering he was hypotensive despite that his cortisol level was low which suggest he has relative adrenal insufficiency Decrease hydrocortisone to 50 mg every 12 hours, then slowly taper, he may need to be on low-dose prednisone upon discharge  Acute respiratory failure with hypoxia Possible aspiration pneumonia Recurrent Left Pleural Effusion Patient was fully extubated yesterday Continue IV Zosyn Currently on room air Will hold off on thoracentesis considering patient is asymptomatic  Sepsis, POA History of osteomyelitis of the right fifth metatarsal/septic arthritis of fifth MTP joint Possible UTI Currently on IV Zosyn for 6 weeks end date of 08/14/2023. Lactate has cleared Cultures have been negative  Diastolic heart failure was ruled out, echocardiogram last month showing normal EF with no diastolic dysfunction  Sinus bradycardia Heart rate improved now in high 50s to 60s but is still bradycardic TSH is normal Checking cortisol As he woke up his heart rate is in 70s to 80s  Hypomagnesemia, improved Prolonged qtc Continue aggressive electrolyte replacement  HTN/HLD Continue statin Holding antihypertensive meds  DMT2 Last hemoglobin A1c was 7.8 Continue  sliding scale insulin with CBG goal 140-180 Hold long-acting insulin for now  Chronic anemia H&H is stable Closely monitor and transfuse if less than 7  Paraplegia secondary to transverse myelitis Neurogenic bladder s/p suprapubic catheter Hold baclofen  Stage IV decubitus ulcer POA Ischial decubitus ulcer POA Wound care consulted  Depression/anxiety Hold Cymbalta, trazodone  History of gastrocolic fistula S/p colostomy Continue colostomy care  Best Practice (right click and "Reselect all SmartList Selections" daily)   Diet/type: Consistent carbohydrate diet DVT prophylaxis: LMWH GI prophylaxis: H2B Lines: N/A Foley:  Yes, and it is still needed; suprapubic catheter Code Status:  full code Last date of multidisciplinary goals of care discussion [11/11 spoke w/ wife [name] over phone]  Labs   CBC: Recent Labs  Lab 07/30/23 2207 07/30/23 2242 07/30/23 2303 07/31/23 0700 07/31/23 2212  WBC 9.0  --   --  10.6*  --   NEUTROABS 6.9  --   --   --   --   HGB 9.4* 11.2*  10.9* 9.2* 9.5* 9.2*  HCT 32.4* 33.0*  32.0* 27.0* 30.8* 27.0*  MCV 83.7  --   --  81.7  --   PLT 410*  --   --  552*  --     Basic Metabolic Panel: Recent Labs  Lab 07/25/23 1130 07/27/23 0457 07/30/23 2207 07/30/23 2242 07/30/23 2303 07/31/23 0100 07/31/23 0700 07/31/23  2029 07/31/23 2212  NA 133* 137 139 139  139 139  --  138  --  139  K 3.3* 4.3 3.6 3.6  3.6 3.5  --  4.2  --  3.6  CL 95* 98 105 103  --   --  108  --   --   CO2 28 26 22   --   --   --  20*  --   --   GLUCOSE 281* 152* 131* 123*  --   --  89  --   --   BUN 19 12 13 14   --   --  13  --   --   CREATININE 0.93 0.72 0.65 0.60*  --   --  0.68  --   --   CALCIUM 8.5* 9.1 9.4  --   --   --  9.1  --   --   MG  --   --   --   --   --  1.3* 3.0* 2.4  --    GFR: Estimated Creatinine Clearance: 86 mL/min (by C-G formula based on SCr of 0.68 mg/dL). Recent Labs  Lab 07/30/23 2207 07/30/23 2242 07/31/23 0100 07/31/23 0700   WBC 9.0  --   --  10.6*  LATICACIDVEN  --  2.5* 1.9 1.4    Liver Function Tests: Recent Labs  Lab 07/30/23 2207  AST 54*  ALT 50*  ALKPHOS 86  BILITOT 0.3  PROT 6.1*  ALBUMIN 2.7*   No results for input(s): "LIPASE", "AMYLASE" in the last 168 hours. Recent Labs  Lab 07/31/23 0100  AMMONIA 16    ABG    Component Value Date/Time   PHART 7.417 07/31/2023 2212   PCO2ART 34.5 07/31/2023 2212   PO2ART 80 (L) 07/31/2023 2212   HCO3 22.2 07/31/2023 2212   TCO2 23 07/31/2023 2212   ACIDBASEDEF 2.0 07/31/2023 2212   O2SAT 96 07/31/2023 2212     Coagulation Profile: Recent Labs  Lab 07/30/23 2207  INR 1.1    Cardiac Enzymes: No results for input(s): "CKTOTAL", "CKMB", "CKMBINDEX", "TROPONINI" in the last 168 hours.  HbA1C: Hgb A1c MFr Bld  Date/Time Value Ref Range Status  06/06/2023 02:36 AM 7.5 (H) 4.8 - 5.6 % Final    Comment:    (NOTE) Pre diabetes:          5.7%-6.4%  Diabetes:              >6.4%  Glycemic control for   <7.0% adults with diabetes   05/22/2023 03:44 AM 8.0 (H) 4.8 - 5.6 % Final    Comment:    (NOTE) Pre diabetes:          5.7%-6.4%  Diabetes:              >6.4%  Glycemic control for   <7.0% adults with diabetes     CBG: Recent Labs  Lab 07/31/23 1556 07/31/23 1941 07/31/23 2343 08/01/23 0344 08/01/23 0743  GLUCAP 147* 157* 115* 133* 124*      Cheri Fowler, MD [facility] See Amion for pager If no response to pager, please call [phone] until 7pm After 7pm, Please call E-link [phone]

## 2023-08-02 DIAGNOSIS — G9340 Encephalopathy, unspecified: Secondary | ICD-10-CM | POA: Diagnosis not present

## 2023-08-02 LAB — GLUCOSE, CAPILLARY
Glucose-Capillary: 154 mg/dL — ABNORMAL HIGH (ref 70–99)
Glucose-Capillary: 171 mg/dL — ABNORMAL HIGH (ref 70–99)
Glucose-Capillary: 189 mg/dL — ABNORMAL HIGH (ref 70–99)
Glucose-Capillary: 210 mg/dL — ABNORMAL HIGH (ref 70–99)
Glucose-Capillary: 220 mg/dL — ABNORMAL HIGH (ref 70–99)
Glucose-Capillary: 276 mg/dL — ABNORMAL HIGH (ref 70–99)

## 2023-08-02 LAB — COMPREHENSIVE METABOLIC PANEL
ALT: 69 U/L — ABNORMAL HIGH (ref 0–44)
AST: 95 U/L — ABNORMAL HIGH (ref 15–41)
Albumin: 2.7 g/dL — ABNORMAL LOW (ref 3.5–5.0)
Alkaline Phosphatase: 102 U/L (ref 38–126)
Anion gap: 10 (ref 5–15)
BUN: 13 mg/dL (ref 8–23)
CO2: 27 mmol/L (ref 22–32)
Calcium: 9 mg/dL (ref 8.9–10.3)
Chloride: 102 mmol/L (ref 98–111)
Creatinine, Ser: 0.95 mg/dL (ref 0.61–1.24)
GFR, Estimated: >60 mL/min (ref >60)
Glucose, Bld: 241 mg/dL — ABNORMAL HIGH (ref 70–99)
Potassium: 3.2 mmol/L — ABNORMAL LOW (ref 3.5–5.1)
Sodium: 139 mmol/L (ref 135–145)
Total Bilirubin: 0.5 mg/dL (ref <1.2)
Total Protein: 6.1 g/dL — ABNORMAL LOW (ref 6.5–8.1)

## 2023-08-02 LAB — CBC WITH DIFFERENTIAL/PLATELET
Abs Immature Granulocytes: 0.06 10*3/uL (ref 0.00–0.07)
Basophils Absolute: 0 10*3/uL (ref 0.0–0.1)
Basophils Relative: 1 %
Eosinophils Absolute: 0 10*3/uL (ref 0.0–0.5)
Eosinophils Relative: 0 %
HCT: 31.9 % — ABNORMAL LOW (ref 39.0–52.0)
Hemoglobin: 9.6 g/dL — ABNORMAL LOW (ref 13.0–17.0)
Immature Granulocytes: 1 %
Lymphocytes Relative: 22 %
Lymphs Abs: 1.4 10*3/uL (ref 0.7–4.0)
MCH: 24.6 pg — ABNORMAL LOW (ref 26.0–34.0)
MCHC: 30.1 g/dL (ref 30.0–36.0)
MCV: 81.8 fL (ref 80.0–100.0)
Monocytes Absolute: 0.4 10*3/uL (ref 0.1–1.0)
Monocytes Relative: 6 %
Neutro Abs: 4.5 10*3/uL (ref 1.7–7.7)
Neutrophils Relative %: 70 %
Platelets: 375 10*3/uL (ref 150–400)
RBC: 3.9 MIL/uL — ABNORMAL LOW (ref 4.22–5.81)
RDW: 16.2 % — ABNORMAL HIGH (ref 11.5–15.5)
WBC: 6.4 10*3/uL (ref 4.0–10.5)
nRBC: 0 % (ref 0.0–0.2)

## 2023-08-02 LAB — MAGNESIUM: Magnesium: 1.7 mg/dL (ref 1.7–2.4)

## 2023-08-02 LAB — PHOSPHORUS: Phosphorus: 3.3 mg/dL (ref 2.5–4.6)

## 2023-08-02 MED ORDER — POTASSIUM CHLORIDE CRYS ER 20 MEQ PO TBCR
40.0000 meq | EXTENDED_RELEASE_TABLET | Freq: Once | ORAL | Status: AC
  Administered 2023-08-02: 40 meq via ORAL
  Filled 2023-08-02: qty 2

## 2023-08-02 MED ORDER — QUETIAPINE FUMARATE 25 MG PO TABS
25.0000 mg | ORAL_TABLET | Freq: Every day | ORAL | Status: DC
  Administered 2023-08-02: 25 mg via ORAL
  Filled 2023-08-02: qty 1

## 2023-08-02 MED ORDER — POLYETHYLENE GLYCOL 3350 17 G PO PACK: 17.0000 g | PACK | Freq: Every day | ORAL | Status: DC | PRN

## 2023-08-02 MED ORDER — ACETAMINOPHEN 325 MG PO TABS
650.0000 mg | ORAL_TABLET | ORAL | Status: DC | PRN
Start: 2023-08-02 — End: 2023-08-08
  Administered 2023-08-02 – 2023-08-06 (×5): 650 mg via ORAL
  Filled 2023-08-02 (×5): qty 2

## 2023-08-02 MED ORDER — DOCUSATE SODIUM 50 MG/5ML PO LIQD: 100.0000 mg | Freq: Two times a day (BID) | ORAL | Status: DC | PRN

## 2023-08-02 NOTE — Plan of Care (Signed)
  Problem: Education: Goal: Ability to describe self-care measures that may prevent or decrease complications (Diabetes Survival Skills Education) will improve Outcome: Progressing Goal: Individualized Educational Video(s) Outcome: Progressing   Problem: Coping: Goal: Ability to adjust to condition or change in health will improve Outcome: Progressing   Problem: Fluid Volume: Goal: Ability to maintain a balanced intake and output will improve Outcome: Progressing   Problem: Health Behavior/Discharge Planning: Goal: Ability to identify and utilize available resources and services will improve Outcome: Progressing Goal: Ability to manage health-related needs will improve Outcome: Progressing   Problem: Metabolic: Goal: Ability to maintain appropriate glucose levels will improve Outcome: Progressing   Problem: Nutritional: Goal: Maintenance of adequate nutrition will improve Outcome: Progressing Goal: Progress toward achieving an optimal weight will improve Outcome: Progressing   Problem: Skin Integrity: Goal: Risk for impaired skin integrity will decrease Outcome: Progressing   Problem: Tissue Perfusion: Goal: Adequacy of tissue perfusion will improve Outcome: Progressing   Problem: Safety: Goal: Non-violent Restraint(s) Outcome: Progressing   Problem: Education: Goal: Knowledge of General Education information will improve Description: Including pain rating scale, medication(s)/side effects and non-pharmacologic comfort measures Outcome: Progressing   Problem: Health Behavior/Discharge Planning: Goal: Ability to manage health-related needs will improve Outcome: Progressing   Problem: Clinical Measurements: Goal: Ability to maintain clinical measurements within normal limits will improve Outcome: Progressing Goal: Will remain free from infection Outcome: Progressing Goal: Diagnostic test results will improve Outcome: Progressing Goal: Respiratory complications  will improve Outcome: Progressing Goal: Cardiovascular complication will be avoided Outcome: Progressing   Problem: Activity: Goal: Risk for activity intolerance will decrease Outcome: Progressing   Problem: Nutrition: Goal: Adequate nutrition will be maintained Outcome: Progressing   Problem: Coping: Goal: Level of anxiety will decrease Outcome: Progressing   Problem: Elimination: Goal: Will not experience complications related to bowel motility Outcome: Progressing Goal: Will not experience complications related to urinary retention Outcome: Progressing   Problem: Pain Management: Goal: General experience of comfort will improve Outcome: Progressing   Problem: Safety: Goal: Ability to remain free from injury will improve Outcome: Progressing   Problem: Skin Integrity: Goal: Risk for impaired skin integrity will decrease Outcome: Progressing

## 2023-08-02 NOTE — Progress Notes (Addendum)
PROGRESS NOTE    Brent [name].  ZOX:[id number] DOB: 1945/12/22 DOA: 07/30/2023 PCP: [name], MD  No chief complaint on file.   Brief Narrative:   77 year old male with pertinent PMH quadriparesis/quadriplegia from transverse myelitis, HFpEF, hx of osteomyelitis of right fifth metatarsal and septic arthritis of fifth MTP joint on zosyn until 08/14/2023, GI fistula s/p wedge resection 05/2023, neurogenic bladder with suprapubic catheter, DMT2, htn, hld, asthma presents to [facility] ED on 11/10 with AMS.   Patient recently hospitalized on 10/11-10/17 w/ right foot osteomyelitis. OR cultures growing. Providencia/Proteus/E faecalis/Bacteroides. Scheduled to be on 6 weeks of IV Zosyn per ID until 08/14/2023.  Had Mitesh Rosendahl multiple admissions in October due to sob from pleural effusions treated from chf treated w/ lasix. Recently discharged on 11/7 to [facility].    On 11/10 patient admitted to [facility] with AMS.  Per EMS was agitated earlier in the morning but not giving any medications.  Patient later in the evening found poorly responsive and EMS called.  Noted to be bradycardic 50s and hypoxic requiring [place]. CBG 58 given dextrose.  Patient remained altered. Patient given IV fluids and atropine for bradycardia.  Transferred to [facility]. Temp 95 F.  Cultures obtained and started on broad-spectrum antibiotics.  Given unresponsiveness patient intubated for airway protection.  CXR with layering left-sided pleural effusion.  CT head no acute abnormality. ABG WNL. UA w/ small leukocytes. LA 2.5 and wbc 9. PCCM consulted for icu admission.  11/11 admit to [facility] w/ AMS requiring intubation for gcs 1  11/12 extubated 11/13 transferred to [facility]   Assessment & Plan:   Principal Problem:   Acute encephalopathy Active Problems:   Acute respiratory failure (HCC)  Acute encephalopathy of unclear etiology but likely due to polypharmacy Per report patient was agitated at nursing home unsure if he received  any medications for agitation followed by being unresponsive - will discuss additionally with wife for additional hx Concern from ICU regarding relative adrenal insufficiency considering he was bradycardic, hypotensive, hypoglycemic and hypoxic His mental status is improved significantly CT head was negative for acute findings TSH is normal Hold Lamictal, trazodone, benzodiazepines and Cymbalta Resume gradually, will discuss with wife   Possible relative adrenal insufficiency 11/11 cortisol 6.7, low normal Currently getting stress dose steroids  Will taper, consider repeat cortisol +/- stim testing outpatient    Acute respiratory failure with hypoxia Possible aspiration pneumonia Recurrent Left Pleural Effusion Heart Failure with Preserved EF Patient was fully extubated yesterday Continue IV Zosyn Currently on room air Will hold off on thoracentesis considering patient is asymptomatic Currently appears euvolemic, will monitor   Sepsis, POA History of osteomyelitis of the right fifth metatarsal/septic arthritis of fifth MTP joint Currently on IV Zosyn for 6 weeks end date of 08/14/2023. Lactate has cleared Cultures have been negative   Sinus bradycardia Improved, will monitor  TSH is normal   Hypokalemia Hypomagnesemia, improved Prolonged qtc Continue aggressive electrolyte replacement Repeat EKG   HTN/HLD Continue statin Holding antihypertensive meds   DMT2 Last hemoglobin A1c was 7.8 Continue sliding scale insulin with CBG goal 140-180 Hold long-acting insulin for now   Chronic anemia H&H is stable Closely monitor and transfuse if less than 7   Paraplegia secondary to transverse myelitis Neurogenic bladder s/p suprapubic catheter Hold baclofen   Stage IV decubitus ulcer POA Ischial decubitus ulcer POA Wound care consulted   Depression/anxiety Hold Cymbalta, trazodone   History of gastrocolic fistula S/p colostomy Continue colostomy care  DVT  prophylaxis: lovenox Code Status: full Family Communication: none Disposition:   Status is: Inpatient Remains inpatient appropriate because: need for continued inpatient care   Consultants:  PCCM   Procedures:  Intubation/extubation  Antimicrobials:  Anti-infectives (From admission, onward)    Start     Dose/Rate Route Frequency Ordered Stop   07/31/23 0600  piperacillin-tazobactam (ZOSYN) IVPB 3.375 g        3.375 g 12.5 mL/hr over 240 Minutes Intravenous Every 8 hours 07/31/23 0049     07/30/23 2245  vancomycin (VANCOREADY) IVPB 2000 mg/400 mL        2,000 mg 200 mL/hr over 120 Minutes Intravenous  Once 07/30/23 2233 07/31/23 0115   07/30/23 2230  ceFEPIme (MAXIPIME) 2 g in sodium chloride 0.9 % 100 mL IVPB        2 g 200 mL/hr over 30 Minutes Intravenous  Once 07/30/23 2228 07/30/23 2247   07/30/23 2230  metroNIDAZOLE (FLAGYL) IVPB 500 mg        500 mg 100 mL/hr over 60 Minutes Intravenous  Once 07/30/23 2228 07/31/23 0016   07/30/23 2230  vancomycin (VANCOCIN) IVPB 1000 mg/200 mL premix  Status:  Discontinued        1,000 mg 200 mL/hr over 60 Minutes Intravenous  Once 07/30/23 2228 07/30/23 2233       Subjective: No new complaints  Objective: Vitals:   08/02/23 0714 08/02/23 0820 08/02/23 1113 08/02/23 1519  BP:  (!) 165/67 (!) 154/56 (!) 154/66  Pulse:   63 74  Resp:  20    Temp:   98 F (36.7 C) 97.6 F (36.4 C)  TempSrc:    Oral  SpO2:  98% 98% 99%  Weight: 91 kg     Height:        Intake/Output Summary (Last 24 hours) at 08/02/2023 1710 Last data filed at 08/02/2023 1526 Gross per 24 hour  Intake 240 ml  Output 2595 ml  Net -2355 ml   Filed Weights   07/31/23 0700 08/01/23 0716 08/02/23 0714  Weight: 92.4 kg 93 kg 91 kg    Examination:  General exam: Appears calm and comfortable  Respiratory system: CTAB Cardiovascular system: RRR Gastrointestinal system: ostomy, suprapubic catheter - s/nt/nd Central nervous system: Alert and oriented.  Bilateral LE weakness Extremities: no LEE    Data Reviewed: I have personally reviewed following labs and imaging studies  CBC: Recent Labs  Lab 07/30/23 2207 07/30/23 2242 07/30/23 2303 07/31/23 0700 07/31/23 2212 08/02/23 0859  WBC 9.0  --   --  10.6*  --  6.4  NEUTROABS 6.9  --   --   --   --  4.5  HGB 9.4* 11.2*  10.9* 9.2* 9.5* 9.2* 9.6*  HCT 32.4* 33.0*  32.0* 27.0* 30.8* 27.0* 31.9*  MCV 83.7  --   --  81.7  --  81.8  PLT 410*  --   --  552*  --  375    Basic Metabolic Panel: Recent Labs  Lab 07/27/23 0457 07/30/23 2207 07/30/23 2242 07/30/23 2303 07/31/23 0100 07/31/23 0700 07/31/23 2029 07/31/23 2212 08/02/23 0859  NA 137 139 139  139 139  --  138  --  139 139  K 4.3 3.6 3.6  3.6 3.5  --  4.2  --  3.6 3.2*  CL 98 105 103  --   --  108  --   --  102  CO2 26 22  --   --   --  20*  --   --  27  GLUCOSE 152* 131* 123*  --   --  89  --   --  241*  BUN 12 13 14   --   --  13  --   --  13  CREATININE 0.72 0.65 0.60*  --   --  0.68  --   --  0.95  CALCIUM 9.1 9.4  --   --   --  9.1  --   --  9.0  MG  --   --   --   --  1.3* 3.0* 2.4  --  1.7  PHOS  --   --   --   --   --   --   --   --  3.3    GFR: Estimated Creatinine Clearance: 72.4 mL/min (by C-G formula based on SCr of 0.95 mg/dL).  Liver Function Tests: Recent Labs  Lab 07/30/23 2207 08/02/23 0859  AST 54* 95*  ALT 50* 69*  ALKPHOS 86 102  BILITOT 0.3 0.5  PROT 6.1* 6.1*  ALBUMIN 2.7* 2.7*    CBG: Recent Labs  Lab 08/01/23 2004 08/02/23 0045 08/02/23 0826 08/02/23 1220 08/02/23 1518  GLUCAP 209* 154* 189* 276* 210*     Recent Results (from the past 240 hour(s))  Blood Culture (routine x 2)     Status: None (Preliminary result)   Collection Time: 07/30/23 10:22 PM   Specimen: BLOOD LEFT HAND  Result Value Ref Range Status   Specimen Description BLOOD LEFT HAND  Final   Special Requests   Final    BOTTLES DRAWN AEROBIC AND ANAEROBIC Blood Culture adequate volume   Culture    Final    NO GROWTH 3 DAYS Performed at [facility], [address] [place], [place] [address]    Report Status PENDING  Incomplete  Resp panel by RT-PCR (RSV, Flu Lurline Caver&B, Covid) Anterior Nasal Swab     Status: None   Collection Time: 07/30/23 10:25 PM   Specimen: Anterior Nasal Swab  Result Value Ref Range Status   SARS Coronavirus 2 by RT PCR NEGATIVE NEGATIVE Final   Influenza Jennie Bolar by PCR NEGATIVE NEGATIVE Final   Influenza B by PCR NEGATIVE NEGATIVE Final    Comment: (NOTE) The Xpert Xpress SARS-CoV-2/FLU/RSV plus assay is intended as an aid in the diagnosis of influenza from Nasopharyngeal swab specimens and should not be used as Mikaeel Petrow sole basis for treatment. Nasal washings and aspirates are unacceptable for Xpert Xpress SARS-CoV-2/FLU/RSV testing.  Fact Sheet for Patients: BloggerCourse.com  Fact Sheet for Healthcare Providers: SeriousBroker.it  This test is not yet approved or cleared by the [place] FDA and has been authorized for detection and/or diagnosis of SARS-CoV-2 by FDA under an Emergency Use Authorization (EUA). This EUA will remain in effect (meaning this test can be used) for the duration of the COVID-19 declaration under Section 564(b)(1) of the Act, 21 U.S.C. section 360bbb-3(b)(1), unless the authorization is terminated or revoked.     Resp Syncytial Virus by PCR NEGATIVE NEGATIVE Final    Comment: (NOTE) Fact Sheet for Patients: BloggerCourse.com  Fact Sheet for Healthcare Providers: SeriousBroker.it  This test is not yet approved or cleared by the [place] FDA and has been authorized for detection and/or diagnosis of SARS-CoV-2 by FDA under an Emergency Use Authorization (EUA). This EUA will remain in effect (meaning this test can be used) for the duration of the COVID-19 declaration under Section 564(b)(1) of the Act,  21 U.S.C. section  360bbb-3(b)(1), unless the authorization is terminated or revoked.  Performed at [facility], [address] [place], [place] [address]   Blood Culture (routine x 2)     Status: None (Preliminary result)   Collection Time: 07/30/23 10:30 PM   Specimen: BLOOD  Result Value Ref Range Status   Specimen Description BLOOD LEFT ANTECUBITAL  Final   Special Requests   Final    BOTTLES DRAWN AEROBIC AND ANAEROBIC Blood Culture adequate volume   Culture   Final    NO GROWTH 3 DAYS Performed at [facility], [address] [place], [place] [address]    Report Status PENDING  Incomplete  Urine Culture (for pregnant, neutropenic or urologic patients or patients with an indwelling urinary catheter)     Status: None   Collection Time: 07/30/23 11:03 PM   Specimen: Urine, Suprapubic  Result Value Ref Range Status   Specimen Description URINE, SUPRAPUBIC  Final   Special Requests NONE  Final   Culture   Final    NO GROWTH Performed at [facility], [address], [place], [place] [address]    Report Status 08/01/2023 FINAL  Final  MRSA Next Gen by PCR, Nasal     Status: None   Collection Time: 07/31/23  1:00 AM   Specimen: Nasal Mucosa; Nasal Swab  Result Value Ref Range Status   MRSA by PCR Next Gen NOT DETECTED NOT DETECTED Final    Comment: (NOTE) The GeneXpert MRSA Assay (FDA approved for NASAL specimens only), is one component of Kaston Faughn comprehensive MRSA colonization surveillance program. It is not intended to diagnose MRSA infection nor to guide or monitor treatment for MRSA infections. Test performance is not FDA approved in patients less than 23 years old. Performed at [facility], [address], [place], [place] [address]          Radiology Studies: No results found.      Scheduled Meds:  atorvastatin  40 mg Oral QHS   Chlorhexidine Gluconate Cloth  6 each Topical Daily   enoxaparin (LOVENOX) injection  40 mg Subcutaneous Q24H    famotidine  20 mg Oral BID   furosemide  40 mg Intravenous Q12H   Gerhardt's butt cream   Topical TID   hydrocortisone sod succinate (SOLU-CORTEF) inj  50 mg Intravenous Q12H   insulin aspart  0-9 Units Subcutaneous Q4H   leptospermum manuka honey  1 Application Topical Daily   mouth rinse  15 mL Mouth Rinse 4 times per day   polyethylene glycol  17 g Oral Daily   Continuous Infusions:  piperacillin-tazobactam (ZOSYN)  IV 3.375 g (08/02/23 1338)     LOS: 2 days    Time spent: over 30 min    Lacretia Nicks, MD [facility]   To contact the attending provider between 7A-7P or the covering provider during after hours 7P-7A, please log into the web site [email or web address] and access using universal [place] password for that web site. If you do not have the password, please call the hospital operator.  08/02/2023, 5:10 PM

## 2023-08-03 DIAGNOSIS — G9340 Encephalopathy, unspecified: Secondary | ICD-10-CM | POA: Diagnosis not present

## 2023-08-03 LAB — COMPREHENSIVE METABOLIC PANEL
ALT: 87 U/L — ABNORMAL HIGH (ref 0–44)
AST: 108 U/L — ABNORMAL HIGH (ref 15–41)
Albumin: 2.6 g/dL — ABNORMAL LOW (ref 3.5–5.0)
Alkaline Phosphatase: 114 U/L (ref 38–126)
Anion gap: 10 (ref 5–15)
BUN: 15 mg/dL (ref 8–23)
CO2: 26 mmol/L (ref 22–32)
Calcium: 8.8 mg/dL — ABNORMAL LOW (ref 8.9–10.3)
Chloride: 98 mmol/L (ref 98–111)
Creatinine, Ser: 1.22 mg/dL (ref 0.61–1.24)
GFR, Estimated: >60 mL/min (ref >60)
Glucose, Bld: 274 mg/dL — ABNORMAL HIGH (ref 70–99)
Potassium: 3.2 mmol/L — ABNORMAL LOW (ref 3.5–5.1)
Sodium: 134 mmol/L — ABNORMAL LOW (ref 135–145)
Total Bilirubin: 0.4 mg/dL (ref <1.2)
Total Protein: 5.8 g/dL — ABNORMAL LOW (ref 6.5–8.1)

## 2023-08-03 LAB — CBC WITH DIFFERENTIAL/PLATELET
Abs Immature Granulocytes: 0.05 10*3/uL (ref 0.00–0.07)
Basophils Absolute: 0 10*3/uL (ref 0.0–0.1)
Basophils Relative: 1 %
Eosinophils Absolute: 0 10*3/uL (ref 0.0–0.5)
Eosinophils Relative: 0 %
HCT: 30 % — ABNORMAL LOW (ref 39.0–52.0)
Hemoglobin: 9 g/dL — ABNORMAL LOW (ref 13.0–17.0)
Immature Granulocytes: 1 %
Lymphocytes Relative: 18 %
Lymphs Abs: 1.1 10*3/uL (ref 0.7–4.0)
MCH: 24.3 pg — ABNORMAL LOW (ref 26.0–34.0)
MCHC: 30 g/dL (ref 30.0–36.0)
MCV: 80.9 fL (ref 80.0–100.0)
Monocytes Absolute: 0.4 10*3/uL (ref 0.1–1.0)
Monocytes Relative: 7 %
Neutro Abs: 4.5 10*3/uL (ref 1.7–7.7)
Neutrophils Relative %: 73 %
Platelets: 338 10*3/uL (ref 150–400)
RBC: 3.71 MIL/uL — ABNORMAL LOW (ref 4.22–5.81)
RDW: 15.9 % — ABNORMAL HIGH (ref 11.5–15.5)
WBC: 6.2 10*3/uL (ref 4.0–10.5)
nRBC: 0 % (ref 0.0–0.2)

## 2023-08-03 LAB — GLUCOSE, CAPILLARY
Glucose-Capillary: 131 mg/dL — ABNORMAL HIGH (ref 70–99)
Glucose-Capillary: 156 mg/dL — ABNORMAL HIGH (ref 70–99)
Glucose-Capillary: 205 mg/dL — ABNORMAL HIGH (ref 70–99)
Glucose-Capillary: 213 mg/dL — ABNORMAL HIGH (ref 70–99)
Glucose-Capillary: 262 mg/dL — ABNORMAL HIGH (ref 70–99)
Glucose-Capillary: 305 mg/dL — ABNORMAL HIGH (ref 70–99)

## 2023-08-03 LAB — MAGNESIUM: Magnesium: 1.6 mg/dL — ABNORMAL LOW (ref 1.7–2.4)

## 2023-08-03 LAB — PHOSPHORUS: Phosphorus: 3 mg/dL (ref 2.5–4.6)

## 2023-08-03 MED ORDER — QUETIAPINE FUMARATE 25 MG PO TABS
25.0000 mg | ORAL_TABLET | Freq: Two times a day (BID) | ORAL | Status: DC
  Administered 2023-08-03 – 2023-08-06 (×7): 25 mg via ORAL
  Filled 2023-08-03 (×7): qty 1

## 2023-08-03 MED ORDER — DULOXETINE HCL 30 MG PO CPEP
30.0000 mg | ORAL_CAPSULE | Freq: Every day | ORAL | Status: DC
Start: 2023-08-03 — End: 2023-08-08
  Administered 2023-08-03 – 2023-08-08 (×6): 30 mg via ORAL
  Filled 2023-08-03 (×6): qty 1

## 2023-08-03 MED ORDER — LAMOTRIGINE 25 MG PO TABS
50.0000 mg | ORAL_TABLET | Freq: Every day | ORAL | Status: DC
  Administered 2023-08-03: 50 mg via ORAL

## 2023-08-03 MED ORDER — INSULIN ASPART 100 UNIT/ML IJ SOLN
0.0000 [IU] | Freq: Every day | INTRAMUSCULAR | Status: DC
  Administered 2023-08-03: 2 [IU] via SUBCUTANEOUS
  Administered 2023-08-05: 3 [IU] via SUBCUTANEOUS

## 2023-08-03 MED ORDER — BACLOFEN 10 MG PO TABS
5.0000 mg | ORAL_TABLET | Freq: Three times a day (TID) | ORAL | Status: DC
  Administered 2023-08-03 – 2023-08-08 (×15): 5 mg via ORAL
  Filled 2023-08-03 (×15): qty 1

## 2023-08-03 MED ORDER — POTASSIUM CHLORIDE CRYS ER 20 MEQ PO TBCR
40.0000 meq | EXTENDED_RELEASE_TABLET | ORAL | Status: AC
  Administered 2023-08-03 (×2): 40 meq via ORAL
  Filled 2023-08-03 (×2): qty 2

## 2023-08-03 MED ORDER — LAMOTRIGINE 25 MG PO TABS
25.0000 mg | ORAL_TABLET | Freq: Two times a day (BID) | ORAL | Status: DC
  Administered 2023-08-06 – 2023-08-08 (×5): 25 mg via ORAL
  Filled 2023-08-03 (×5): qty 1

## 2023-08-03 MED ORDER — LAMOTRIGINE 25 MG PO TABS: 25.0000 mg | ORAL_TABLET | Freq: Every day | ORAL | Status: DC

## 2023-08-03 MED ORDER — LAMOTRIGINE 25 MG PO TABS: 50.0000 mg | ORAL_TABLET | Freq: Two times a day (BID) | ORAL | Status: DC

## 2023-08-03 MED ORDER — INSULIN ASPART 100 UNIT/ML IJ SOLN
0.0000 [IU] | Freq: Three times a day (TID) | INTRAMUSCULAR | Status: DC
  Administered 2023-08-04 (×2): 5 [IU] via SUBCUTANEOUS
  Administered 2023-08-04: 3 [IU] via SUBCUTANEOUS
  Administered 2023-08-05: 7 [IU] via SUBCUTANEOUS
  Administered 2023-08-05: 2 [IU] via SUBCUTANEOUS
  Administered 2023-08-05: 5 [IU] via SUBCUTANEOUS
  Administered 2023-08-06: 3 [IU] via SUBCUTANEOUS
  Administered 2023-08-06: 5 [IU] via SUBCUTANEOUS

## 2023-08-03 MED ORDER — LAMOTRIGINE 25 MG PO TABS
25.0000 mg | ORAL_TABLET | Freq: Every day | ORAL | Status: AC
  Administered 2023-08-03 – 2023-08-05 (×3): 25 mg via ORAL
  Filled 2023-08-03 (×3): qty 1

## 2023-08-03 MED ORDER — KETOROLAC TROMETHAMINE 15 MG/ML IJ SOLN
15.0000 mg | INTRAMUSCULAR | Status: AC
  Administered 2023-08-03: 15 mg via INTRAVENOUS
  Filled 2023-08-03: qty 1

## 2023-08-03 MED ORDER — MAGNESIUM SULFATE 4 GM/100ML IV SOLN
4.0000 g | Freq: Once | INTRAVENOUS | Status: AC
  Administered 2023-08-03: 4 g via INTRAVENOUS
  Filled 2023-08-03: qty 100

## 2023-08-03 MED ORDER — INSULIN ASPART 100 UNIT/ML IJ SOLN
3.0000 [IU] | Freq: Three times a day (TID) | INTRAMUSCULAR | Status: DC
  Administered 2023-08-04 – 2023-08-08 (×12): 3 [IU] via SUBCUTANEOUS

## 2023-08-03 NOTE — Inpatient Diabetes Management (Signed)
Inpatient Diabetes Program Recommendations  AACE/ADA: New Consensus Statement on Inpatient Glycemic Control (2015)  Target Ranges:  Prepandial:   less than 140 mg/dL      Peak postprandial:   less than 180 mg/dL (1-2 hours)      Critically ill patients:  140 - 180 mg/dL   Lab Results  Component Value Date   GLUCAP 131 (H) 08/03/2023   HGBA1C 7.5 (H) 06/06/2023    Review of Glycemic Control  Latest Reference Range & Units 08/02/23 08:26 08/02/23 12:20 08/02/23 15:18 08/02/23 20:09 08/03/23 00:00 08/03/23 03:26 08/03/23 07:54  Glucose-Capillary 70 - 99 mg/dL 604 (H) 540 (H) 981 (H) 220 (H) 213 (H) 305 (H) 131 (H)  (H): Data is abnormally high  Diabetes history: DM2 Outpatient Diabetes medications: Semglee 24 units BID, Novolog 0-15 units TID, Metformin 1000 mg  BID Current orders for Inpatient glycemic control: Novolog 0-9 units Q4H, Solumedrol 50 mg Q12H  Inpatient Diabetes Program Recommendations:    Novolog 3 units TID Novolog 0-9 units TID and 0-5 units at bedtime  Will continue to follow while inpatient.  Thank you, Dulce Sellar, MSN, CDCES Diabetes Coordinator Inpatient Diabetes Program [phone] (team pager from 8a-5p)

## 2023-08-03 NOTE — NC FL2 (Signed)
[place] MEDICAID FL2 LEVEL OF CARE FORM     IDENTIFICATION  Patient Name: Brent [name]. Birthdate: 01/02/1946 Sex: male Admission Date (Current Location): 07/30/2023  [facility] and [place] Number:  Producer, television/film/video and Address:  The . [facility], [address] [place], [place] [address]      Provider Number: [id number]  Attending Physician Name and Address:  Zigmund Daniel., *  Relative Name and Phone Number:       Current Level of Care: Hospital Recommended Level of Care: Skilled Nursing Facility Prior Approval Number:    Date Approved/Denied:   PASRR Number:    Discharge Plan: SNF    Current Diagnoses: Patient Active Problem List   Diagnosis Date Noted   Acute encephalopathy 07/31/2023   Acute respiratory failure (HCC) 07/31/2023   Bilateral pleural effusion 07/26/2023   Acute confusional state 07/26/2023   Pleural effusion 07/19/2023   Leukocytosis 07/19/2023   Sinus bradycardia 07/19/2023   (HFpEF) heart failure with preserved ejection fraction (HCC) 07/14/2023   History of osteomyelitis 07/14/2023   GERD (gastroesophageal reflux disease) 07/14/2023   Diabetic foot ulcer (HCC) 07/01/2023   Acute osteomyelitis of metatarsal bone of right foot (HCC) 07/01/2023   Septic arthritis of foot (HCC) 07/01/2023   Colostomy present (HCC) 07/01/2023   DM type 2 (diabetes mellitus, type 2) (HCC) 07/01/2023   Aortic atherosclerosis (HCC) 07/01/2023   Pressure ulcer 07/01/2023   Stage IV decubitus ulcer (HCC) 06/30/2023   Urine leukocytes 06/30/2023   SIRS (systemic inflammatory response syndrome) (HCC) 06/05/2023   Suprapubic catheter (HCC) 06/05/2023   Dehydration 02/21/2023   Sepsis (HCC) 02/21/2023   Acute metabolic encephalopathy 02/21/2023   Lactic acidosis 02/21/2023   Insulin dependent type 2 diabetes mellitus (HCC) 02/21/2023   Normocytic anemia 02/21/2023   Primary malignant neuroendocrine tumor of stomach (HCC)  05/27/2022   Preoperative examination 05/27/2022   Abnormal endoscopy of upper gastrointestinal tract 05/27/2022   Benign neuroendocrine tumor of stomach  04/04/2022   Mucosal abnormality of stomach    Chronic osteomyelitis of pelvic region, right (HCC) 03/25/2022   External gastrointestinal fistula site infection 03/25/2022   Complicated urinary tract infection    Catheter-associated urinary tract infection (HCC) 08/06/2021   Osteomyelitis of pelvic region, acute, right (HCC) 03/24/2021   Right ischial pressure sore 02/10/2021   Stage IV pressure ulcer of sacral region (HCC) 12/22/2020   Hypokalemia    Encounter for monitoring immunomodulating therapy 08/20/2020   High risk medication use 08/20/2020   Malnutrition of moderate degree 06/04/2020   Palliative care by specialist    Goals of care, counseling/discussion    FTT (failure to thrive) in adult 06/01/2020   Abdominal pain    Sacral ulcer, with necrosis of muscle (HCC) 04/09/2020   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HCC)    Transverse myelitis (HCC)    Labile blood pressure    Labile blood glucose    Neurogenic bladder    Neurogenic bowel    Transaminitis    Controlled type 2 diabetes mellitus with hyperglycemia, with long-term current use of insulin (HCC)    Quadriplegia (HCC)    Dyslipidemia    Paraplegia (HCC)    Coronary artery disease involving native coronary artery of native heart without angina pectoris    Steroid-induced hyperglycemia    Diabetic peripheral neuropathy (HCC)    Weakness 03/09/2020   Chest pain 03/08/2020   Right leg numbness    Malignant melanoma of left side of neck (HCC) 10/25/2018  Facial neuritis 10/25/2018   Myofascial pain syndrome 10/25/2018   Occipital neuralgia of left side 10/25/2018   Male hypogonadism 06/20/2016   Renal lesion 06/20/2016   Insomnia 08/07/2015   Enlarged prostate with lower urinary tract symptoms (LUTS) 11/07/2014   Balanitis 07/30/2012   Bladder neck obstruction 07/30/2012    Prostate nodule 06/18/2012   Recurrent nephrolithiasis 06/18/2012   Benign neoplasm of colon 08/29/2011   DEPRESSION, SITUATIONAL, ACUTE 01/23/2010   Essential hypertension 05/30/2009   Coronary atherosclerosis 05/30/2009   Obstructive sleep apnea 11/28/2008   OBESITY 09/23/2008   ERECTILE DYSFUNCTION 11/26/2007   Well controlled type 2 diabetes mellitus with peripheral circulatory disorder (HCC) 05/09/2007   Hyperlipidemia 05/09/2007   MYOCARDIAL INFARCTION, HX OF 05/09/2007   DIVERTICULOSIS, COLON 05/09/2007    Orientation RESPIRATION BLADDER Height & Weight     Place, Situation  Normal Continent Weight: 200 lb 2.8 oz (90.8 kg) Height:  6\' 1"  (185.4 cm)  BEHAVIORAL SYMPTOMS/MOOD NEUROLOGICAL BOWEL NUTRITION STATUS      Continent Diet (Carb Modified)  AMBULATORY STATUS COMMUNICATION OF NEEDS Skin   Total Care Verbally Surgical wounds, Skin abrasions (Ecchymosis,buttocks,Bil.,Erythema,coccyx,Bil.,Wound/Inc. LDAs,PI Buttocks,Bil.,deep tissue,PI Ischial tuberosity,post.,proximal,R,stage 3 Inc.closed,foot,R,Wound/Inc.open or dehis. arm,L,lower,post)                       Personal Care Assistance Level of Assistance    Bathing Assistance: Maximum assistance Feeding assistance: Independent Dressing Assistance: Maximum assistance     Functional Limitations Info    Sight Info: Impaired Hearing Info: Adequate Speech Info: Adequate    SPECIAL CARE FACTORS FREQUENCY                       Contractures      Additional Factors Info  Code Status, Allergies Code Status Info: Full Allergies Info: Cipro (Ciprofloxacin Hcl), Neurontin (Gabapentin), Levaquin (Levofloxacin Psychotropic Info: 25 MG Seroquel daily at bedtime         Current Medications (08/03/2023):  This is the current hospital active medication list Current Facility-Administered Medications  Medication Dose Route Frequency Provider Last Rate Last Admin   acetaminophen (TYLENOL) tablet 650 mg  650  mg Oral Q4H PRN Hammons, Kimberly B, RPH   650 mg at 08/02/23 2044   atorvastatin (LIPITOR) tablet 40 mg  40 mg Oral QHS Rexford Maus, RPH   40 mg at 08/02/23 2044   Chlorhexidine Gluconate Cloth 2 % PADS 6 each  6 each Topical Daily Kalman Shan, MD   6 each at 08/03/23 0955   docusate (COLACE) 50 MG/5ML liquid 100 mg  100 mg Oral BID PRN Hammons, Kimberly B, RPH       enoxaparin (LOVENOX) injection 40 mg  40 mg Subcutaneous Q24H Kalman Shan, MD   40 mg at 08/03/23 0953   famotidine (PEPCID) tablet 20 mg  20 mg Oral BID Rexford Maus, RPH   20 mg at 08/03/23 0953   furosemide (LASIX) injection 40 mg  40 mg Intravenous Q12H Cheri Fowler, MD   40 mg at 08/03/23 0856   Gerhardt's butt cream   Topical TID Kalman Shan, MD   1 Application at 08/03/23 0954   hydrocortisone sodium succinate (SOLU-CORTEF) 100 MG injection 50 mg  50 mg Intravenous Q12H Cheri Fowler, MD   50 mg at 08/03/23 0954   insulin aspart (novoLOG) injection 0-9 Units  0-9 Units Subcutaneous Q4H Pia Mau D, PA-C   1 Units at 08/03/23 0830   ipratropium-albuterol (DUONEB) 0.5-2.5 (3) MG/3ML nebulizer  solution 3 mL  3 mL Nebulization Q4H PRN Pia Mau D, PA-C       leptospermum manuka honey (MEDIHONEY) paste 1 Application  1 Application Topical Daily Kalman Shan, MD   1 Application at 08/03/23 0954   LORazepam (ATIVAN) tablet 0.5 mg  0.5 mg Oral BID PRN Migdalia Dk, MD   0.5 mg at 08/02/23 2355   magnesium sulfate IVPB 4 g 100 mL  4 g Intravenous Once Zigmund Daniel., MD 50 mL/hr at 08/03/23 0906 4 g at 08/03/23 0906   ondansetron (ZOFRAN) injection 4 mg  4 mg Intravenous Q8H PRN Cheri Fowler, MD   4 mg at 08/03/23 0429   Oral care mouth rinse  15 mL Mouth Rinse 4 times per day Cheri Fowler, MD   15 mL at 08/03/23 0857   Oral care mouth rinse  15 mL Mouth Rinse PRN Cheri Fowler, MD       oxyCODONE (Oxy IR/ROXICODONE) immediate release tablet 5 mg  5 mg Oral Q12H PRN Migdalia Dk, MD   5 mg at 08/03/23 0429   piperacillin-tazobactam (ZOSYN) IVPB 3.375 g  3.375 g Intravenous Q8H Juliette Mangle, RPH 12.5 mL/hr at 08/03/23 0622 3.375 g at 08/03/23 0622   polyethylene glycol (MIRALAX / GLYCOLAX) packet 17 g  17 g Oral Daily Rexford Maus, RPH   17 g at 08/03/23 0953   polyethylene glycol (MIRALAX / GLYCOLAX) packet 17 g  17 g Oral Daily PRN Hammons, Kimberly B, RPH       potassium chloride SA (KLOR-CON M) CR tablet 40 mEq  40 mEq Oral Q4H Zigmund Daniel., MD   40 mEq at 08/03/23 0857   QUEtiapine (SEROQUEL) tablet 25 mg  25 mg Oral QHS Zigmund Daniel., MD   25 mg at 08/02/23 2044     Discharge Medications: Please see discharge summary for a list of discharge medications.  Relevant Imaging Results:  Relevant Lab Results:   Additional Information [id number]  Fernande Treiber Felipa Em[place], Student-Social Work

## 2023-08-03 NOTE — Progress Notes (Signed)
PROGRESS NOTE    Brent [name].  BMW:[id number] DOB: 1946-09-05 DOA: 07/30/2023 PCP: [name], MD  No chief complaint on file.   Brief Narrative:   77 year old male with pertinent PMH quadriparesis/quadriplegia from transverse myelitis, HFpEF, hx of osteomyelitis of right fifth metatarsal and septic arthritis of fifth MTP joint on zosyn until 08/14/2023, GI fistula s/p wedge resection 05/2023, neurogenic bladder with suprapubic catheter, DMT2, htn, hld, asthma presents to [facility] ED on 11/10 with AMS.   Patient recently hospitalized on 10/11-10/17 w/ right foot osteomyelitis. OR cultures growing. Providencia/Proteus/E faecalis/Bacteroides. Scheduled to be on 6 weeks of IV Zosyn per ID until 08/14/2023.  Had Brent [name] multiple admissions in October due to sob from pleural effusions treated from chf treated w/ lasix. Recently discharged on 11/7 to [facility].    On 11/10 patient admitted to [facility] with AMS.  Per EMS was agitated earlier in the morning but not giving any medications.  Patient later in the evening found poorly responsive and EMS called.  Noted to be bradycardic 50s and hypoxic requiring [place]. CBG 58 given dextrose.  Patient remained altered. Patient given IV fluids and atropine for bradycardia.  Transferred to [facility]. Temp 95 F.  Cultures obtained and started on broad-spectrum antibiotics.  Given unresponsiveness patient intubated for airway protection.  CXR with layering left-sided pleural effusion.  CT head no acute abnormality. ABG WNL. UA w/ small leukocytes. LA 2.5 and wbc 9. PCCM consulted for icu admission.  11/11 admit to [facility] w/ AMS requiring intubation for gcs 1  11/12 extubated 11/13 transferred to [facility]   Assessment & Plan:   Principal Problem:   Acute encephalopathy Active Problems:   Acute respiratory failure (HCC)  Acute encephalopathy of unclear etiology but likely due to polypharmacy Delirium  Per report patient was agitated at nursing home unsure if  he received any medications for agitation followed by being unresponsive - will discuss additionally with wife for additional hx Concern from ICU regarding relative adrenal insufficiency considering he was bradycardic, hypotensive, hypoglycemic and hypoxic His mental status is improved significantly CT head was negative for acute findings TSH is normal Home meds are gradually being resumed Seroquel started yesterday, he has delirium (required restraints last night)  Resume gradually, will discuss with wife   Possible relative adrenal insufficiency 11/11 cortisol 6.7, low normal Currently getting stress dose steroids  Will taper to off, consider repeat cortisol +/- stim testing outpatient (vs here in [place] few days)   Acute respiratory failure with hypoxia Possible aspiration pneumonia Recurrent Left Pleural Effusion Heart Failure with Preserved EF Patient was fully extubated 11/12 Continue IV Zosyn Currently on room air Will hold off on thoracentesis considering patient is asymptomatic Continue BID IV lasix -> transition to oral lasix when able  Currently appears euvolemic, will monitor   Sepsis, POA History of osteomyelitis of the right fifth metatarsal/septic arthritis of fifth MTP joint Currently on IV Zosyn for 6 weeks end date of 08/14/2023. Lactate has cleared Cultures have been negative   Sinus bradycardia Improved, will monitor  TSH is normal Continue to hold atenolol    Hypokalemia Hypomagnesemia, improved Prolonged qtc Continue aggressive electrolyte replacement Repeat EKG   HTN/HLD Continue statin Fluctuating BP Holding antihypertensive meds   DMT2 Last hemoglobin A1c was 7.8 Mealtime + SSI  Hold long-acting insulin for now   Chronic anemia H&H is stable Closely monitor and transfuse if less than 7   Paraplegia secondary to transverse myelitis Neurogenic bladder s/p suprapubic  catheter Resume baclofen at lower dose, follow  Was started on lamictal for  dysesthesias, continue and monitor   Stage IV decubitus ulcer POA Ischial decubitus ulcer POA Wound care consulted   Depression/anxiety Resume Cymbalta, trazodone   History of gastrocolic fistula S/p colostomy Continue colostomy care     DVT prophylaxis: lovenox Code Status: full Family Communication: none Disposition:   Status is: Inpatient Remains inpatient appropriate because: need for continued inpatient care   Consultants:  PCCM   Procedures:  Intubation/extubation  Antimicrobials:  Anti-infectives (From admission, onward)    Start     Dose/Rate Route Frequency Ordered Stop   07/31/23 0600  piperacillin-tazobactam (ZOSYN) IVPB 3.375 g        3.375 g 12.5 mL/hr over 240 Minutes Intravenous Every 8 hours 07/31/23 0049     07/30/23 2245  vancomycin (VANCOREADY) IVPB 2000 mg/400 mL        2,000 mg 200 mL/hr over 120 Minutes Intravenous  Once 07/30/23 2233 07/31/23 0115   07/30/23 2230  ceFEPIme (MAXIPIME) 2 g in sodium chloride 0.9 % 100 mL IVPB        2 g 200 mL/hr over 30 Minutes Intravenous  Once 07/30/23 2228 07/30/23 2247   07/30/23 2230  metroNIDAZOLE (FLAGYL) IVPB 500 mg        500 mg 100 mL/hr over 60 Minutes Intravenous  Once 07/30/23 2228 07/31/23 0016   07/30/23 2230  vancomycin (VANCOCIN) IVPB 1000 mg/200 mL premix  Status:  Discontinued        1,000 mg 200 mL/hr over 60 Minutes Intravenous  Once 07/30/23 2228 07/30/23 2233       Subjective:  Talking about smoke and Brent [name] battery burning Brent [name]&O, but seems confused about events of this morning  Objective: Vitals:   08/03/23 0438 08/03/23 0757 08/03/23 1127 08/03/23 1527  BP:  (!) 163/80 (!) 169/67 134/61  Pulse:  61 73 71  Resp:  17 18 17   Temp:  97.9 F (36.6 C) 97.8 F (36.6 C) 98.4 F (36.9 C)  TempSrc:  Oral Oral Oral  SpO2:  100% 98% 98%  Weight: 90.8 kg     Height:        Intake/Output Summary (Last 24 hours) at 08/03/2023 1615 Last data filed at 08/03/2023 0955 Gross per 24 hour   Intake 578.4 ml  Output 620 ml  Net -41.6 ml   Filed Weights   08/01/23 0716 08/02/23 0714 08/03/23 0438  Weight: 93 kg 91 kg 90.8 kg    Examination:  General: No acute distress. Cardiovascular: RRR Lungs: unlabored Neurological: Alert and oriented to location, self, month, but seems confused - telling Daijanae Rafalski story about this morning that doesn't make much sense.  Bilateral LE weakness.  Extremities: No clubbing or cyanosis. No edema.   Data Reviewed: I have personally reviewed following labs and imaging studies  CBC: Recent Labs  Lab 07/30/23 2207 07/30/23 2242 07/30/23 2303 07/31/23 0700 07/31/23 2212 08/02/23 0859 08/03/23 0440  WBC 9.0  --   --  10.6*  --  6.4 6.2  NEUTROABS 6.9  --   --   --   --  4.5 4.5  HGB 9.4*   < > 9.2* 9.5* 9.2* 9.6* 9.0*  HCT 32.4*   < > 27.0* 30.8* 27.0* 31.9* 30.0*  MCV 83.7  --   --  81.7  --  81.8 80.9  PLT 410*  --   --  552*  --  375 338   < > =  values in this interval not displayed.    Basic Metabolic Panel: Recent Labs  Lab 07/30/23 2207 07/30/23 2242 07/30/23 2303 07/31/23 0100 07/31/23 0700 07/31/23 2029 07/31/23 2212 08/02/23 0859 08/03/23 0440  NA 139 139  139 139  --  138  --  139 139 134*  K 3.6 3.6  3.6 3.5  --  4.2  --  3.6 3.2* 3.2*  CL 105 103  --   --  108  --   --  102 98  CO2 22  --   --   --  20*  --   --  27 26  GLUCOSE 131* 123*  --   --  89  --   --  241* 274*  BUN 13 14  --   --  13  --   --  13 15  CREATININE 0.65 0.60*  --   --  0.68  --   --  0.95 1.22  CALCIUM 9.4  --   --   --  9.1  --   --  9.0 8.8*  MG  --   --   --  1.3* 3.0* 2.4  --  1.7 1.6*  PHOS  --   --   --   --   --   --   --  3.3 3.0    GFR: Estimated Creatinine Clearance: 56.4 mL/min (by C-G formula based on SCr of 1.22 mg/dL).  Liver Function Tests: Recent Labs  Lab 07/30/23 2207 08/02/23 0859 08/03/23 0440  AST 54* 95* 108*  ALT 50* 69* 87*  ALKPHOS 86 102 114  BILITOT 0.3 0.5 0.4  PROT 6.1* 6.1* 5.8*  ALBUMIN 2.7*  2.7* 2.6*    CBG: Recent Labs  Lab 08/03/23 0000 08/03/23 0326 08/03/23 0754 08/03/23 1124 08/03/23 1524  GLUCAP 213* 305* 131* 156* 262*     Recent Results (from the past 240 hour(s))  Blood Culture (routine x 2)     Status: None (Preliminary result)   Collection Time: 07/30/23 10:22 PM   Specimen: BLOOD LEFT HAND  Result Value Ref Range Status   Specimen Description BLOOD LEFT HAND  Final   Special Requests   Final    BOTTLES DRAWN AEROBIC AND ANAEROBIC Blood Culture adequate volume   Culture   Final    NO GROWTH 4 DAYS Performed at [facility], [address] [place], [place] [address]    Report Status PENDING  Incomplete  Resp panel by RT-PCR (RSV, Flu Chandlor Noecker&B, Covid) Anterior Nasal Swab     Status: None   Collection Time: 07/30/23 10:25 PM   Specimen: Anterior Nasal Swab  Result Value Ref Range Status   SARS Coronavirus 2 by RT PCR NEGATIVE NEGATIVE Final   Influenza Patina Spanier by PCR NEGATIVE NEGATIVE Final   Influenza B by PCR NEGATIVE NEGATIVE Final    Comment: (NOTE) The Xpert Xpress SARS-CoV-2/FLU/RSV plus assay is intended as an aid in the diagnosis of influenza from Nasopharyngeal swab specimens and should not be used as Dealva Lafoy sole basis for treatment. Nasal washings and aspirates are unacceptable for Xpert Xpress SARS-CoV-2/FLU/RSV testing.  Fact Sheet for Patients: BloggerCourse.com  Fact Sheet for Healthcare Providers: SeriousBroker.it  This test is not yet approved or cleared by the [place] FDA and has been authorized for detection and/or diagnosis of SARS-CoV-2 by FDA under an Emergency Use Authorization (EUA). This EUA will remain in effect (meaning this test can be used) for the duration of the COVID-19  declaration under Section 564(b)(1) of the Act, 21 U.S.C. section 360bbb-3(b)(1), unless the authorization is terminated or revoked.     Resp Syncytial Virus by PCR NEGATIVE NEGATIVE Final     Comment: (NOTE) Fact Sheet for Patients: BloggerCourse.com  Fact Sheet for Healthcare Providers: SeriousBroker.it  This test is not yet approved or cleared by the [place] FDA and has been authorized for detection and/or diagnosis of SARS-CoV-2 by FDA under an Emergency Use Authorization (EUA). This EUA will remain in effect (meaning this test can be used) for the duration of the COVID-19 declaration under Section 564(b)(1) of the Act, 21 U.S.C. section 360bbb-3(b)(1), unless the authorization is terminated or revoked.  Performed at [facility], [address] [place], [place] [address]   Blood Culture (routine x 2)     Status: None (Preliminary result)   Collection Time: 07/30/23 10:30 PM   Specimen: BLOOD  Result Value Ref Range Status   Specimen Description BLOOD LEFT ANTECUBITAL  Final   Special Requests   Final    BOTTLES DRAWN AEROBIC AND ANAEROBIC Blood Culture adequate volume   Culture   Final    NO GROWTH 4 DAYS Performed at [facility], [address] [place], [place] [address]    Report Status PENDING  Incomplete  Urine Culture (for pregnant, neutropenic or urologic patients or patients with an indwelling urinary catheter)     Status: None   Collection Time: 07/30/23 11:03 PM   Specimen: Urine, Suprapubic  Result Value Ref Range Status   Specimen Description URINE, SUPRAPUBIC  Final   Special Requests NONE  Final   Culture   Final    NO GROWTH Performed at [facility], [address], [place], [place] [address]    Report Status 08/01/2023 FINAL  Final  MRSA Next Gen by PCR, Nasal     Status: None   Collection Time: 07/31/23  1:00 AM   Specimen: Nasal Mucosa; Nasal Swab  Result Value Ref Range Status   MRSA by PCR Next Gen NOT DETECTED NOT DETECTED Final    Comment: (NOTE) The GeneXpert MRSA Assay (FDA approved for NASAL specimens only), is one component of Janavia Rottman comprehensive MRSA  colonization surveillance program. It is not intended to diagnose MRSA infection nor to guide or monitor treatment for MRSA infections. Test performance is not FDA approved in patients less than 6 years old. Performed at [facility], [address], [place], [place] [address]          Radiology Studies: No results found.      Scheduled Meds:  atorvastatin  40 mg Oral QHS   Chlorhexidine Gluconate Cloth  6 each Topical Daily   enoxaparin (LOVENOX) injection  40 mg Subcutaneous Q24H   famotidine  20 mg Oral BID   furosemide  40 mg Intravenous Q12H   Gerhardt's butt cream   Topical TID   hydrocortisone sod succinate (SOLU-CORTEF) inj  50 mg Intravenous Q12H   insulin aspart  0-9 Units Subcutaneous Q4H   lamoTRIgine  25 mg Oral QHS   Followed by   Melene Muller ON 08/06/2023] lamoTRIgine  25 mg Oral BID   Followed by   Melene Muller ON 08/09/2023] lamoTRIgine  25 mg Oral Daily   Followed by   Melene Muller ON 08/12/2023] lamoTRIgine  50 mg Oral BID   [START ON 08/09/2023] lamoTRIgine  50 mg Oral QHS   leptospermum manuka honey  1 Application Topical Daily   mouth rinse  15 mL Mouth Rinse 4 times per day   polyethylene glycol  17 g Oral Daily   QUEtiapine  25 mg Oral BID   Continuous Infusions:  piperacillin-tazobactam (ZOSYN)  IV 3.375 g (08/03/23 1331)     LOS: 3 days    Time spent: over 30 min    Lacretia Nicks, MD [facility]   To contact the attending provider between 7A-7P or the covering provider during after hours 7P-7A, please log into the web site [email or web address] and access using universal [place] password for that web site. If you do not have the password, please call the hospital operator.  08/03/2023, 4:15 PM

## 2023-08-03 NOTE — Progress Notes (Signed)
At 1915 patient was screaming fire, fire fire, upon walking in the room, he has pulled his PICC line out, tele wires, gown and reaching for foley. He was non redirectable, requesting for the police and fire department to be call.

## 2023-08-03 NOTE — Plan of Care (Signed)
  Problem: Education: Goal: Ability to describe self-care measures that may prevent or decrease complications (Diabetes Survival Skills Education) will improve Outcome: Progressing Goal: Individualized Educational Video(s) Outcome: Progressing   

## 2023-08-03 NOTE — Progress Notes (Signed)
At start of shift, pt yelling and screaming, restless calling for police and fire department. Attempted to reorient patient. Pt had pulled out  tele and PICC line( IV team notified). Will leave out PICC for now and DR. Powell plan to address in am and follow up with day RN.

## 2023-08-04 DIAGNOSIS — G9340 Encephalopathy, unspecified: Secondary | ICD-10-CM | POA: Diagnosis not present

## 2023-08-04 LAB — GLUCOSE, CAPILLARY
Glucose-Capillary: 217 mg/dL — ABNORMAL HIGH (ref 70–99)
Glucose-Capillary: 215 mg/dL — ABNORMAL HIGH (ref 70–99)
Glucose-Capillary: 296 mg/dL — ABNORMAL HIGH (ref 70–99)
Glucose-Capillary: 249 mg/dL — ABNORMAL HIGH (ref 70–99)
Glucose-Capillary: 175 mg/dL — ABNORMAL HIGH (ref 70–99)

## 2023-08-04 LAB — COMPREHENSIVE METABOLIC PANEL
ALT: 80 U/L — ABNORMAL HIGH (ref 0–44)
AST: 68 U/L — ABNORMAL HIGH (ref 15–41)
Albumin: 2.6 g/dL — ABNORMAL LOW (ref 3.5–5.0)
Alkaline Phosphatase: 108 U/L (ref 38–126)
Anion gap: 10 (ref 5–15)
BUN: 13 mg/dL (ref 8–23)
CO2: 25 mmol/L (ref 22–32)
Calcium: 8.7 mg/dL — ABNORMAL LOW (ref 8.9–10.3)
Chloride: 99 mmol/L (ref 98–111)
Creatinine, Ser: 0.83 mg/dL (ref 0.61–1.24)
GFR, Estimated: >60 mL/min (ref >60)
Glucose, Bld: 193 mg/dL — ABNORMAL HIGH (ref 70–99)
Potassium: 3.2 mmol/L — ABNORMAL LOW (ref 3.5–5.1)
Sodium: 134 mmol/L — ABNORMAL LOW (ref 135–145)
Total Bilirubin: 0.5 mg/dL (ref <1.2)
Total Protein: 5.7 g/dL — ABNORMAL LOW (ref 6.5–8.1)

## 2023-08-04 LAB — PHOSPHORUS: Phosphorus: 2 mg/dL — ABNORMAL LOW (ref 2.5–4.6)

## 2023-08-04 LAB — CULTURE, BLOOD (ROUTINE X 2)
Culture: NO GROWTH
Culture: NO GROWTH
Special Requests: ADEQUATE
Special Requests: ADEQUATE

## 2023-08-04 LAB — CBC WITH DIFFERENTIAL/PLATELET
Abs Immature Granulocytes: 0.05 10*3/uL (ref 0.00–0.07)
Basophils Absolute: 0 10*3/uL (ref 0.0–0.1)
Basophils Relative: 0 %
Eosinophils Absolute: 0.1 10*3/uL (ref 0.0–0.5)
Eosinophils Relative: 1 %
HCT: 31.1 % — ABNORMAL LOW (ref 39.0–52.0)
Hemoglobin: 9.4 g/dL — ABNORMAL LOW (ref 13.0–17.0)
Immature Granulocytes: 1 %
Lymphocytes Relative: 16 %
Lymphs Abs: 1.2 10*3/uL (ref 0.7–4.0)
MCH: 24.4 pg — ABNORMAL LOW (ref 26.0–34.0)
MCHC: 30.2 g/dL (ref 30.0–36.0)
MCV: 80.6 fL (ref 80.0–100.0)
Monocytes Absolute: 0.6 10*3/uL (ref 0.1–1.0)
Monocytes Relative: 8 %
Neutro Abs: 5.5 10*3/uL (ref 1.7–7.7)
Neutrophils Relative %: 74 %
Platelets: 302 10*3/uL (ref 150–400)
RBC: 3.86 MIL/uL — ABNORMAL LOW (ref 4.22–5.81)
RDW: 16.1 % — ABNORMAL HIGH (ref 11.5–15.5)
WBC: 7.5 10*3/uL (ref 4.0–10.5)
nRBC: 0 % (ref 0.0–0.2)

## 2023-08-04 LAB — MAGNESIUM: Magnesium: 1.8 mg/dL (ref 1.7–2.4)

## 2023-08-04 MED ORDER — POTASSIUM CHLORIDE CRYS ER 20 MEQ PO TBCR
40.0000 meq | EXTENDED_RELEASE_TABLET | ORAL | Status: AC
Start: 2023-08-04 — End: 2023-08-04
  Administered 2023-08-04 (×2): 40 meq via ORAL
  Filled 2023-08-04 (×2): qty 2

## 2023-08-04 MED ORDER — INSULIN GLARGINE-YFGN 100 UNIT/ML ~~LOC~~ SOLN
10.0000 [IU] | Freq: Every day | SUBCUTANEOUS | Status: DC
  Administered 2023-08-04 – 2023-08-06 (×3): 10 [IU] via SUBCUTANEOUS
  Filled 2023-08-04 (×3): qty 0.1

## 2023-08-04 NOTE — TOC Progression Note (Signed)
Transition of Care (TOC) - Progression Note    Patient Details  Name: Brent [name]. MRN: [id number] Date of Birth: Jan 30, 1946  Transition of Care [facility]) CM/SW Contact  Baldemar Lenis, [place] Phone Number: 08/04/2023, 2:13 PM  Clinical Narrative:   CSW following for return to SNF when medically stable and out of restraints. CSW to follow.    Expected Discharge Plan: Skilled Nursing Facility Barriers to Discharge: Continued Medical Work up, Facility will not accept until restraint criteria met  Expected Discharge Plan and Services In-house Referral: Clinical Social Work   Post Acute Care Choice: Skilled Nursing Facility Living arrangements for the past 2 months: Skilled Nursing Facility                                       Social Determinants of Health (SDOH) Interventions SDOH Screenings   Food Insecurity: No Food Insecurity (07/19/2023)  Housing: Low Risk  (07/19/2023)  Transportation Needs: No Transportation Needs (07/19/2023)  Utilities: Not At Risk (07/19/2023)  Depression (PHQ2-9): Low Risk  (12/09/2020)  Financial Resource Strain: Low Risk  (03/06/2020)  Physical Activity: Inactive (12/14/2018)  Social Connections: Unknown (12/14/2018)  Stress: No Stress Concern Present (12/14/2018)  Tobacco Use: Medium Risk (07/30/2023)    Readmission Risk Interventions    07/31/2023    9:17 AM 07/02/2023   12:31 PM  Readmission Risk Prevention Plan  Transportation Screening Complete Complete  Medication Review (RN Care Manager) Complete Complete  PCP or Specialist appointment within 3-5 days of discharge Complete Not Complete  PCP/Specialist Appt Not Complete comments  SNF resident  HRI or Home Care Consult Complete Complete  SW Recovery Care/Counseling Consult Complete Complete  Palliative Care Screening Not Applicable Not Applicable  Skilled Nursing Facility Complete Complete

## 2023-08-04 NOTE — Progress Notes (Signed)
PROGRESS NOTE    Brent [name].  KVQ:[id number] DOB: 01-15-1946 DOA: 07/30/2023 PCP: [name], MD  No chief complaint on file.   Brief Narrative:   77 year old male with pertinent PMH quadriparesis/quadriplegia from transverse myelitis, HFpEF, hx of osteomyelitis of right fifth metatarsal and septic arthritis of fifth MTP joint on zosyn until 08/14/2023, GI fistula s/p wedge resection 05/2023, neurogenic bladder with suprapubic catheter, DMT2, htn, hld, asthma presents to [facility] ED on 11/10 with AMS.   Patient recently hospitalized on 10/11-10/17 w/ right foot osteomyelitis. OR cultures growing. Providencia/Proteus/E faecalis/Bacteroides. Scheduled to be on 6 weeks of IV Zosyn per ID until 08/14/2023.  Had Brent [name] multiple admissions in October due to sob from pleural effusions treated from chf treated w/ lasix. Recently discharged on 11/7 to [facility].    On 11/10 patient admitted to [facility] with AMS.  Per EMS was agitated earlier in the morning but not giving any medications.  Patient later in the evening found poorly responsive and EMS called.  Noted to be bradycardic 50s and hypoxic requiring [place]. CBG 58 given dextrose.  Patient remained altered. Patient given IV fluids and atropine for bradycardia.  Transferred to [facility]. Temp 95 F.  Cultures obtained and started on broad-spectrum antibiotics.  Given unresponsiveness patient intubated for airway protection.  CXR with layering left-sided pleural effusion.  CT head no acute abnormality. ABG WNL. UA w/ small leukocytes. LA 2.5 and wbc 9. PCCM consulted for icu admission.  11/11 admit to [facility] w/ AMS requiring intubation for gcs 1  11/12 extubated 11/13 transferred to [facility]   Assessment & Plan:   Principal Problem:   Acute encephalopathy Active Problems:   Acute respiratory failure (HCC)  Acute encephalopathy of unclear etiology but likely due to polypharmacy Delirium  Per report patient was agitated at nursing home unsure if  he received any medications for agitation followed by being unresponsive - will discuss additionally with wife for additional hx Concern from ICU regarding relative adrenal insufficiency considering he was bradycardic, hypotensive, hypoglycemic and hypoxic His mental status is improved significantly CT head was negative for acute findings TSH is normal Continue seroquel, adjust as needed give his delirium (required restraints again last night)  Resuming home meds gradually   Possible relative adrenal insufficiency 11/11 cortisol 6.7, low normal Currently getting stress dose steroids  Will taper to off, consider repeat cortisol +/- stim testing outpatient (vs here in [name] few days)   Acute respiratory failure with hypoxia Possible aspiration pneumonia Recurrent Left Pleural Effusion Heart Failure with Preserved EF Patient was fully extubated 11/12 Continue IV Zosyn Currently on room air Will hold off on thoracentesis considering patient is asymptomatic Continue BID IV lasix -> transition to oral lasix when able  Currently appears euvolemic, will monitor   Sepsis, POA History of osteomyelitis of the right fifth metatarsal/septic arthritis of fifth MTP joint Currently on IV Zosyn for 6 weeks end date of 08/14/2023. Lactate has cleared Cultures have been negative   Sinus bradycardia Improved, will monitor  TSH is normal Continue to hold atenolol    Hypokalemia Hypomagnesemia Prolonged qtc Continue aggressive electrolyte replacement Repeat EKG with improved qtc   HTN/HLD Continue statin Fluctuating BP Holding antihypertensive meds   DMT2 Last hemoglobin A1c was 7.8 Mealtime + SSI  Hold long-acting insulin for now   Chronic anemia H&H is stable Closely monitor and transfuse if less than 7   Paraplegia secondary to transverse myelitis Neurogenic bladder s/p suprapubic catheter Resume baclofen  at lower dose, follow  Was started on lamictal for dysesthesias, continue and  monitor   Stage IV decubitus ulcer POA Ischial decubitus ulcer POA Wound care consulted   Depression/anxiety Resume Cymbalta, trazodone   History of gastrocolic fistula S/p colostomy Continue colostomy care     DVT prophylaxis: lovenox Code Status: full Family Communication: none Disposition:   Status is: Inpatient Remains inpatient appropriate because: need for continued inpatient care   Consultants:  PCCM   Procedures:  Intubation/extubation  Antimicrobials:  Anti-infectives (From admission, onward)    Start     Dose/Rate Route Frequency Ordered Stop   07/31/23 0600  piperacillin-tazobactam (ZOSYN) IVPB 3.375 g        3.375 g 12.5 mL/hr over 240 Minutes Intravenous Every 8 hours 07/31/23 0049     07/30/23 2245  vancomycin (VANCOREADY) IVPB 2000 mg/400 mL        2,000 mg 200 mL/hr over 120 Minutes Intravenous  Once 07/30/23 2233 07/31/23 0115   07/30/23 2230  ceFEPIme (MAXIPIME) 2 g in sodium chloride 0.9 % 100 mL IVPB        2 g 200 mL/hr over 30 Minutes Intravenous  Once 07/30/23 2228 07/30/23 2247   07/30/23 2230  metroNIDAZOLE (FLAGYL) IVPB 500 mg        500 mg 100 mL/hr over 60 Minutes Intravenous  Once 07/30/23 2228 07/31/23 0016   07/30/23 2230  vancomycin (VANCOCIN) IVPB 1000 mg/200 mL premix  Status:  Discontinued        1,000 mg 200 mL/hr over 60 Minutes Intravenous  Once 07/30/23 2228 07/30/23 2233       Subjective:  Sleepy, no complaints Thinks he's at SNF  Objective: Vitals:   08/03/23 2347 08/04/23 0408 08/04/23 1011 08/04/23 1229  BP: (!) 157/46 (!) 153/48 (!) 187/56 (!) 157/62  Pulse: 68 69 70 66  Resp: 18 18  19   Temp: 98 F (36.7 C) 98 F (36.7 C) 98.3 F (36.8 C) 97.8 F (36.6 C)  TempSrc: Oral  Oral Axillary  SpO2: 98% 98% 95% 96%  Weight:      Height:        Intake/Output Summary (Last 24 hours) at 08/04/2023 1537 Last data filed at 08/04/2023 1323 Gross per 24 hour  Intake 528.4 ml  Output 3500 ml  Net -2971.6 ml    Filed Weights   08/01/23 0716 08/02/23 0714 08/03/23 0438  Weight: 93 kg 91 kg 90.8 kg    Examination:  General: No acute distress. In restraints. Cardiovascular: RRR Lungs: unlabored Neurological: drowsy, Levada Bowersox little confused today, LE weakness Extremities: No clubbing or cyanosis. No edema.   Data Reviewed: I have personally reviewed following labs and imaging studies  CBC: Recent Labs  Lab 07/30/23 2207 07/30/23 2242 07/31/23 0700 07/31/23 2212 08/02/23 0859 08/03/23 0440 08/04/23 0443  WBC 9.0  --  10.6*  --  6.4 6.2 7.5  NEUTROABS 6.9  --   --   --  4.5 4.5 5.5  HGB 9.4*   < > 9.5* 9.2* 9.6* 9.0* 9.4*  HCT 32.4*   < > 30.8* 27.0* 31.9* 30.0* 31.1*  MCV 83.7  --  81.7  --  81.8 80.9 80.6  PLT 410*  --  552*  --  375 338 302   < > = values in this interval not displayed.    Basic Metabolic Panel: Recent Labs  Lab 07/30/23 2207 07/30/23 2242 07/30/23 2303 07/31/23 0700 07/31/23 2029 07/31/23 2212 08/02/23 0859 08/03/23 0440 08/04/23 0443  NA 139 139  139   < > 138  --  139 139 134* 134*  K 3.6 3.6  3.6   < > 4.2  --  3.6 3.2* 3.2* 3.2*  CL 105 103  --  108  --   --  102 98 99  CO2 22  --   --  20*  --   --  27 26 25   GLUCOSE 131* 123*  --  89  --   --  241* 274* 193*  BUN 13 14  --  13  --   --  13 15 13   CREATININE 0.65 0.60*  --  0.68  --   --  0.95 1.22 0.83  CALCIUM 9.4  --   --  9.1  --   --  9.0 8.8* 8.7*  MG  --   --    < > 3.0* 2.4  --  1.7 1.6* 1.8  PHOS  --   --   --   --   --   --  3.3 3.0 2.0*   < > = values in this interval not displayed.    GFR: Estimated Creatinine Clearance: 82.9 mL/min (by C-G formula based on SCr of 0.83 mg/dL).  Liver Function Tests: Recent Labs  Lab 07/30/23 2207 08/02/23 0859 08/03/23 0440 08/04/23 0443  AST 54* 95* 108* 68*  ALT 50* 69* 87* 80*  ALKPHOS 86 102 114 108  BILITOT 0.3 0.5 0.4 0.5  PROT 6.1* 6.1* 5.8* 5.7*  ALBUMIN 2.7* 2.7* 2.6* 2.6*    CBG: Recent Labs  Lab 08/03/23 1124  08/03/23 1524 08/03/23 2028 08/04/23 0627 08/04/23 0834  GLUCAP 156* 262* 205* 217* 215*     Recent Results (from the past 240 hour(s))  Blood Culture (routine x 2)     Status: None   Collection Time: 07/30/23 10:22 PM   Specimen: BLOOD LEFT HAND  Result Value Ref Range Status   Specimen Description BLOOD LEFT HAND  Final   Special Requests   Final    BOTTLES DRAWN AEROBIC AND ANAEROBIC Blood Culture adequate volume   Culture   Final    NO GROWTH 5 DAYS Performed at [facility], [address], [place], [place] [address]    Report Status 08/04/2023 FINAL  Final  Resp panel by RT-PCR (RSV, Flu Yehonatan Grandison&B, Covid) Anterior Nasal Swab     Status: None   Collection Time: 07/30/23 10:25 PM   Specimen: Anterior Nasal Swab  Result Value Ref Range Status   SARS Coronavirus 2 by RT PCR NEGATIVE NEGATIVE Final   Influenza Shamarie Call by PCR NEGATIVE NEGATIVE Final   Influenza B by PCR NEGATIVE NEGATIVE Final    Comment: (NOTE) The Xpert Xpress SARS-CoV-2/FLU/RSV plus assay is intended as an aid in the diagnosis of influenza from Nasopharyngeal swab specimens and should not be used as Seona Clemenson sole basis for treatment. Nasal washings and aspirates are unacceptable for Xpert Xpress SARS-CoV-2/FLU/RSV testing.  Fact Sheet for Patients: BloggerCourse.com  Fact Sheet for Healthcare Providers: SeriousBroker.it  This test is not yet approved or cleared by the [place] FDA and has been authorized for detection and/or diagnosis of SARS-CoV-2 by FDA under an Emergency Use Authorization (EUA). This EUA will remain in effect (meaning this test can be used) for the duration of the COVID-19 declaration under Section 564(b)(1) of the Act, 21 U.S.C. section 360bbb-3(b)(1), unless the authorization is terminated or revoked.     Resp Syncytial Virus by PCR NEGATIVE  NEGATIVE Final    Comment: (NOTE) Fact Sheet for  Patients: BloggerCourse.com  Fact Sheet for Healthcare Providers: SeriousBroker.it  This test is not yet approved or cleared by the [place] FDA and has been authorized for detection and/or diagnosis of SARS-CoV-2 by FDA under an Emergency Use Authorization (EUA). This EUA will remain in effect (meaning this test can be used) for the duration of the COVID-19 declaration under Section 564(b)(1) of the Act, 21 U.S.C. section 360bbb-3(b)(1), unless the authorization is terminated or revoked.  Performed at [facility], [address] [place], [place] [address]   Blood Culture (routine x 2)     Status: None   Collection Time: 07/30/23 10:30 PM   Specimen: BLOOD  Result Value Ref Range Status   Specimen Description BLOOD LEFT ANTECUBITAL  Final   Special Requests   Final    BOTTLES DRAWN AEROBIC AND ANAEROBIC Blood Culture adequate volume   Culture   Final    NO GROWTH 5 DAYS Performed at [facility], [address] [place], [place] [address]    Report Status 08/04/2023 FINAL  Final  Urine Culture (for pregnant, neutropenic or urologic patients or patients with an indwelling urinary catheter)     Status: None   Collection Time: 07/30/23 11:03 PM   Specimen: Urine, Suprapubic  Result Value Ref Range Status   Specimen Description URINE, SUPRAPUBIC  Final   Special Requests NONE  Final   Culture   Final    NO GROWTH Performed at [facility], [address] [place], [place] [address]    Report Status 08/01/2023 FINAL  Final  MRSA Next Gen by PCR, Nasal     Status: None   Collection Time: 07/31/23  1:00 AM   Specimen: Nasal Mucosa; Nasal Swab  Result Value Ref Range Status   MRSA by PCR Next Gen NOT DETECTED NOT DETECTED Final    Comment: (NOTE) The GeneXpert MRSA Assay (FDA approved for NASAL specimens only), is one component of Beverlie Kurihara comprehensive MRSA colonization surveillance program. It is not intended  to diagnose MRSA infection nor to guide or monitor treatment for MRSA infections. Test performance is not FDA approved in patients less than 67 years old. Performed at [facility], [address] [place], [place] [address]          Radiology Studies: No results found.      Scheduled Meds:  atorvastatin  40 mg Oral QHS   baclofen  5 mg Oral TID   Chlorhexidine Gluconate Cloth  6 each Topical Daily   DULoxetine  30 mg Oral Daily   enoxaparin (LOVENOX) injection  40 mg Subcutaneous Q24H   famotidine  20 mg Oral BID   furosemide  40 mg Intravenous Q12H   Gerhardt's butt cream   Topical TID   insulin aspart  0-5 Units Subcutaneous QHS   insulin aspart  0-9 Units Subcutaneous TID WC   insulin aspart  3 Units Subcutaneous TID WC   insulin glargine-yfgn  10 Units Subcutaneous Daily   lamoTRIgine  25 mg Oral QHS   Followed by   Melene Muller ON 08/06/2023] lamoTRIgine  25 mg Oral BID   Followed by   Melene Muller ON 08/09/2023] lamoTRIgine  25 mg Oral Daily   Followed by   Melene Muller ON 08/12/2023] lamoTRIgine  50 mg Oral BID   [START ON 08/09/2023] lamoTRIgine  50 mg Oral QHS   leptospermum manuka honey  1 Application Topical Daily  mouth rinse  15 mL Mouth Rinse 4 times per day   polyethylene glycol  17 g Oral Daily   QUEtiapine  25 mg Oral BID   Continuous Infusions:  piperacillin-tazobactam (ZOSYN)  IV 3.375 g (08/04/23 1354)     LOS: 4 days    Time spent: over 30 min    Lacretia Nicks, MD [facility]   To contact the attending provider between 7A-7P or the covering provider during after hours 7P-7A, please log into the web site [email or web address] and access using universal [place] password for that web site. If you do not have the password, please call the hospital operator.  08/04/2023, 3:37 PM

## 2023-08-04 NOTE — Plan of Care (Signed)
  Problem: Education: Goal: Ability to describe self-care measures that may prevent or decrease complications (Diabetes Survival Skills Education) will improve Outcome: Progressing Goal: Individualized Educational Video(s) Outcome: Progressing   

## 2023-08-04 NOTE — Plan of Care (Signed)
  Problem: Pain Management: Goal: General experience of comfort will improve Outcome: Progressing   Problem: Safety: Goal: Ability to remain free from injury will improve Outcome: Progressing   Problem: Skin Integrity: Goal: Risk for impaired skin integrity will decrease Outcome: Progressing

## 2023-08-05 DIAGNOSIS — G9340 Encephalopathy, unspecified: Secondary | ICD-10-CM | POA: Diagnosis not present

## 2023-08-05 LAB — GLUCOSE, CAPILLARY
Glucose-Capillary: 185 mg/dL — ABNORMAL HIGH (ref 70–99)
Glucose-Capillary: 199 mg/dL — ABNORMAL HIGH (ref 70–99)
Glucose-Capillary: 282 mg/dL — ABNORMAL HIGH (ref 70–99)
Glucose-Capillary: 336 mg/dL — ABNORMAL HIGH (ref 70–99)
Glucose-Capillary: 338 mg/dL — ABNORMAL HIGH (ref 70–99)
Glucose-Capillary: 279 mg/dL — ABNORMAL HIGH (ref 70–99)

## 2023-08-05 LAB — CBC WITH DIFFERENTIAL/PLATELET
Abs Immature Granulocytes: 0.06 10*3/uL (ref 0.00–0.07)
Basophils Absolute: 0.1 10*3/uL (ref 0.0–0.1)
Basophils Relative: 1 %
Eosinophils Absolute: 0.1 10*3/uL (ref 0.0–0.5)
Eosinophils Relative: 2 %
HCT: 33.2 % — ABNORMAL LOW (ref 39.0–52.0)
Hemoglobin: 9.8 g/dL — ABNORMAL LOW (ref 13.0–17.0)
Immature Granulocytes: 1 %
Lymphocytes Relative: 27 %
Lymphs Abs: 2.1 10*3/uL (ref 0.7–4.0)
MCH: 24.6 pg — ABNORMAL LOW (ref 26.0–34.0)
MCHC: 29.5 g/dL — ABNORMAL LOW (ref 30.0–36.0)
MCV: 83.4 fL (ref 80.0–100.0)
Monocytes Absolute: 0.7 10*3/uL (ref 0.1–1.0)
Monocytes Relative: 9 %
Neutro Abs: 4.9 10*3/uL (ref 1.7–7.7)
Neutrophils Relative %: 60 %
Platelets: 260 10*3/uL (ref 150–400)
RBC: 3.98 MIL/uL — ABNORMAL LOW (ref 4.22–5.81)
RDW: 16.5 % — ABNORMAL HIGH (ref 11.5–15.5)
WBC: 8.1 10*3/uL (ref 4.0–10.5)
nRBC: 0 % (ref 0.0–0.2)

## 2023-08-05 LAB — COMPREHENSIVE METABOLIC PANEL
ALT: 62 U/L — ABNORMAL HIGH (ref 0–44)
AST: 43 U/L — ABNORMAL HIGH (ref 15–41)
Albumin: 2.6 g/dL — ABNORMAL LOW (ref 3.5–5.0)
Alkaline Phosphatase: 106 U/L (ref 38–126)
Anion gap: 11 (ref 5–15)
BUN: 13 mg/dL (ref 8–23)
CO2: 24 mmol/L (ref 22–32)
Calcium: 8.5 mg/dL — ABNORMAL LOW (ref 8.9–10.3)
Chloride: 102 mmol/L (ref 98–111)
Creatinine, Ser: 0.74 mg/dL (ref 0.61–1.24)
GFR, Estimated: >60 mL/min (ref >60)
Glucose, Bld: 206 mg/dL — ABNORMAL HIGH (ref 70–99)
Potassium: 4.3 mmol/L (ref 3.5–5.1)
Sodium: 137 mmol/L (ref 135–145)
Total Bilirubin: 0.2 mg/dL (ref <1.2)
Total Protein: 5.5 g/dL — ABNORMAL LOW (ref 6.5–8.1)

## 2023-08-05 LAB — PHOSPHORUS: Phosphorus: 3 mg/dL (ref 2.5–4.6)

## 2023-08-05 LAB — MAGNESIUM: Magnesium: 1.6 mg/dL — ABNORMAL LOW (ref 1.7–2.4)

## 2023-08-05 MED ORDER — KETOROLAC TROMETHAMINE 15 MG/ML IJ SOLN
15.0000 mg | INTRAMUSCULAR | Status: AC
  Administered 2023-08-05: 15 mg via INTRAVENOUS
  Filled 2023-08-05: qty 1

## 2023-08-05 MED ORDER — MAGNESIUM SULFATE 4 GM/100ML IV SOLN
4.0000 g | Freq: Once | INTRAVENOUS | Status: AC
  Administered 2023-08-05: 4 g via INTRAVENOUS
  Filled 2023-08-05: qty 100

## 2023-08-05 NOTE — Progress Notes (Signed)
PROGRESS NOTE    Brent [name].  ZOX:[id number] DOB: 12-Mar-1946 DOA: 07/30/2023 PCP: [name], MD  No chief complaint on file.   Brief Narrative:   77 year old male with pertinent PMH quadriparesis/quadriplegia from transverse myelitis, HFpEF, hx of osteomyelitis of right fifth metatarsal and septic arthritis of fifth MTP joint on zosyn until 08/14/2023, GI fistula s/p wedge resection 05/2023, neurogenic bladder with suprapubic catheter, DMT2, htn, hld, asthma presents to [facility] ED on 11/10 with AMS.   Patient recently hospitalized on 10/11-10/17 w/ right foot osteomyelitis. OR cultures growing. Providencia/Proteus/E faecalis/Bacteroides. Scheduled to be on 6 weeks of IV Zosyn per ID until 08/14/2023.  Had Kazumi Lachney multiple admissions in October due to sob from pleural effusions treated from chf treated w/ lasix. Recently discharged on 11/7 to [facility].    On 11/10 patient admitted to [facility] with AMS.  Per EMS was agitated earlier in the morning but not giving any medications.  Patient later in the evening found poorly responsive and EMS called.  Noted to be bradycardic 50s and hypoxic requiring . CBG 58 given dextrose.  Patient remained altered. Patient given IV fluids and atropine for bradycardia.  Transferred to [facility]. Temp 95 F.  Cultures obtained and started on broad-spectrum antibiotics.  Given unresponsiveness patient intubated for airway protection.  CXR with layering left-sided pleural effusion.  CT head no acute abnormality. ABG WNL. UA w/ small leukocytes. LA 2.5 and wbc 9. PCCM consulted for icu admission.  11/11 admit to [facility] w/ AMS requiring intubation for gcs 1  11/12 extubated 11/13 transferred to [facility]   Assessment & Plan:   Principal Problem:   Acute encephalopathy Active Problems:   Acute respiratory failure (HCC)  Acute encephalopathy of unclear etiology but likely due to polypharmacy Delirium  Per report patient was agitated at nursing home unsure if  he received any medications for agitation followed by being unresponsive  Concern from ICU regarding relative adrenal insufficiency considering he was bradycardic, hypotensive, hypoglycemic and hypoxic His mental status is improved significantly CT head was negative for acute findings TSH is normal Continue seroquel, adjust as needed give his delirium (did ok without restraints last night) Resuming home meds gradually   Possible relative adrenal insufficiency 11/11 cortisol 6.7, low normal Currently getting stress dose steroids  Will taper to off, consider repeat cortisol +/- stim testing outpatient (vs here in [place] few days)   Acute respiratory failure with hypoxia Possible aspiration pneumonia Recurrent Left Pleural Effusion Heart Failure with Preserved EF Patient was fully extubated 11/12 Continue IV Zosyn Currently on room air Will hold off on thoracentesis considering patient is asymptomatic Continue BID IV lasix -> transition to oral lasix when able  Repeat CXR 11/17 AM Currently appears euvolemic, will monitor   Sepsis, POA History of osteomyelitis of the right fifth metatarsal/septic arthritis of fifth MTP joint Currently on IV Zosyn for 6 weeks end date of 08/14/2023. Lactate has cleared Cultures have been negative   Sinus bradycardia Improved, will monitor  TSH is normal Continue to hold atenolol    Hypokalemia Hypomagnesemia Prolonged qtc Continue aggressive electrolyte replacement Repeat EKG with improved qtc   HTN/HLD Continue statin Fluctuating BP Holding antihypertensive meds   DMT2 Last hemoglobin A1c was 7.8 Mealtime + SSI  Hold long-acting insulin for now   Chronic anemia H&H is stable Closely monitor and transfuse if less than 7   Paraplegia secondary to transverse myelitis Neurogenic bladder s/p suprapubic catheter Resume baclofen at lower dose, follow  Was started on lamictal for dysesthesias, continue and monitor   Stage IV decubitus  ulcer POA Ischial decubitus ulcer POA Wound care consulted   Depression/anxiety Resume Cymbalta, trazodone   History of gastrocolic fistula S/p colostomy Continue colostomy care     DVT prophylaxis: lovenox Code Status: full Family Communication: none Disposition:   Status is: Inpatient Remains inpatient appropriate because: need for continued inpatient care   Consultants:  PCCM   Procedures:  Intubation/extubation  Antimicrobials:  Anti-infectives (From admission, onward)    Start     Dose/Rate Route Frequency Ordered Stop   07/31/23 0600  piperacillin-tazobactam (ZOSYN) IVPB 3.375 g        3.375 g 12.5 mL/hr over 240 Minutes Intravenous Every 8 hours 07/31/23 0049     07/30/23 2245  vancomycin (VANCOREADY) IVPB 2000 mg/400 mL        2,000 mg 200 mL/hr over 120 Minutes Intravenous  Once 07/30/23 2233 07/31/23 0115   07/30/23 2230  ceFEPIme (MAXIPIME) 2 g in sodium chloride 0.9 % 100 mL IVPB        2 g 200 mL/hr over 30 Minutes Intravenous  Once 07/30/23 2228 07/30/23 2247   07/30/23 2230  metroNIDAZOLE (FLAGYL) IVPB 500 mg        500 mg 100 mL/hr over 60 Minutes Intravenous  Once 07/30/23 2228 07/31/23 0016   07/30/23 2230  vancomycin (VANCOCIN) IVPB 1000 mg/200 mL premix  Status:  Discontinued        1,000 mg 200 mL/hr over 60 Minutes Intravenous  Once 07/30/23 2228 07/30/23 2233       Subjective:  No complaints today Annalisse Minkoff&O  Objective: Vitals:   08/05/23 0356 08/05/23 0359 08/05/23 0834 08/05/23 1127  BP: (!) 151/63  (!) 167/65 (!) 137/55  Pulse: 64  (!) 58 73  Resp: 17  18 18   Temp: 97.8 F (36.6 C)  97.8 F (36.6 C) 97.9 F (36.6 C)  TempSrc: Oral  Oral Oral  SpO2: 100%  100% 99%  Weight:  90.7 kg    Height:        Intake/Output Summary (Last 24 hours) at 08/05/2023 1433 Last data filed at 08/05/2023 1338 Gross per 24 hour  Intake 299.95 ml  Output 2550 ml  Net -2250.05 ml   Filed Weights   08/02/23 0714 08/03/23 0438 08/05/23 0359   Weight: 91 kg 90.8 kg 90.7 kg    Examination:  General: No acute distress. Cardiovascular: RRR Lungs: unlabored Neurological: bilateral LE weakness Extremities: No clubbing or cyanosis. No edema.  Data Reviewed: I have personally reviewed following labs and imaging studies  CBC: Recent Labs  Lab 07/30/23 2207 07/30/23 2242 07/31/23 0700 07/31/23 2212 08/02/23 0859 08/03/23 0440 08/04/23 0443 08/05/23 0428  WBC 9.0  --  10.6*  --  6.4 6.2 7.5 8.1  NEUTROABS 6.9  --   --   --  4.5 4.5 5.5 4.9  HGB 9.4*   < > 9.5* 9.2* 9.6* 9.0* 9.4* 9.8*  HCT 32.4*   < > 30.8* 27.0* 31.9* 30.0* 31.1* 33.2*  MCV 83.7  --  81.7  --  81.8 80.9 80.6 83.4  PLT 410*  --  552*  --  375 338 302 260   < > = values in this interval not displayed.    Basic Metabolic Panel: Recent Labs  Lab 07/31/23 0700 07/31/23 2029 07/31/23 2212 08/02/23 0859 08/03/23 0440 08/04/23 0443 08/05/23 0428  NA 138  --  139 139 134* 134* 137  K  4.2  --  3.6 3.2* 3.2* 3.2* 4.3  CL 108  --   --  102 98 99 102  CO2 20*  --   --  27 26 25 24   GLUCOSE 89  --   --  241* 274* 193* 206*  BUN 13  --   --  13 15 13 13   CREATININE 0.68  --   --  0.95 1.22 0.83 0.74  CALCIUM 9.1  --   --  9.0 8.8* 8.7* 8.5*  MG 3.0* 2.4  --  1.7 1.6* 1.8 1.6*  PHOS  --   --   --  3.3 3.0 2.0* 3.0    GFR: Estimated Creatinine Clearance: 86 mL/min (by C-G formula based on SCr of 0.74 mg/dL).  Liver Function Tests: Recent Labs  Lab 07/30/23 2207 08/02/23 0859 08/03/23 0440 08/04/23 0443 08/05/23 0428  AST 54* 95* 108* 68* 43*  ALT 50* 69* 87* 80* 62*  ALKPHOS 86 102 114 108 106  BILITOT 0.3 0.5 0.4 0.5 0.2  PROT 6.1* 6.1* 5.8* 5.7* 5.5*  ALBUMIN 2.7* 2.7* 2.6* 2.6* 2.6*    CBG: Recent Labs  Lab 08/04/23 1720 08/04/23 2121 08/05/23 0627 08/05/23 0831 08/05/23 1125  GLUCAP 249* 175* 185* 199* 282*     Recent Results (from the past 240 hour(s))  Blood Culture (routine x 2)     Status: None   Collection Time:  07/30/23 10:22 PM   Specimen: BLOOD LEFT HAND  Result Value Ref Range Status   Specimen Description BLOOD LEFT HAND  Final   Special Requests   Final    BOTTLES DRAWN AEROBIC AND ANAEROBIC Blood Culture adequate volume   Culture   Final    NO GROWTH 5 DAYS Performed at [facility], [address], [place], [place] [address]    Report Status 08/04/2023 FINAL  Final  Resp panel by RT-PCR (RSV, Flu Charne Mcbrien&B, Covid) Anterior Nasal Swab     Status: None   Collection Time: 07/30/23 10:25 PM   Specimen: Anterior Nasal Swab  Result Value Ref Range Status   SARS Coronavirus 2 by RT PCR NEGATIVE NEGATIVE Final   Influenza Bee Marchiano by PCR NEGATIVE NEGATIVE Final   Influenza B by PCR NEGATIVE NEGATIVE Final    Comment: (NOTE) The Xpert Xpress SARS-CoV-2/FLU/RSV plus assay is intended as an aid in the diagnosis of influenza from Nasopharyngeal swab specimens and should not be used as Arlen Dupuis sole basis for treatment. Nasal washings and aspirates are unacceptable for Xpert Xpress SARS-CoV-2/FLU/RSV testing.  Fact Sheet for Patients: BloggerCourse.com  Fact Sheet for Healthcare Providers: SeriousBroker.it  This test is not yet approved or cleared by the [place] FDA and has been authorized for detection and/or diagnosis of SARS-CoV-2 by FDA under an Emergency Use Authorization (EUA). This EUA will remain in effect (meaning this test can be used) for the duration of the COVID-19 declaration under Section 564(b)(1) of the Act, 21 U.S.C. section 360bbb-3(b)(1), unless the authorization is terminated or revoked.     Resp Syncytial Virus by PCR NEGATIVE NEGATIVE Final    Comment: (NOTE) Fact Sheet for Patients: BloggerCourse.com  Fact Sheet for Healthcare Providers: SeriousBroker.it  This test is not yet approved or cleared by the [place] FDA and has been authorized for detection and/or  diagnosis of SARS-CoV-2 by FDA under an Emergency Use Authorization (EUA). This EUA will remain in effect (meaning this test can be used) for the duration of the COVID-19 declaration under Section 564(b)(1)  of the Act, 21 U.S.C. section 360bbb-3(b)(1), unless the authorization is terminated or revoked.  Performed at [facility], [address], [place], [place] [address]   Blood Culture (routine x 2)     Status: None   Collection Time: 07/30/23 10:30 PM   Specimen: BLOOD  Result Value Ref Range Status   Specimen Description BLOOD LEFT ANTECUBITAL  Final   Special Requests   Final    BOTTLES DRAWN AEROBIC AND ANAEROBIC Blood Culture adequate volume   Culture   Final    NO GROWTH 5 DAYS Performed at [facility], [address], [place], [place] [address]    Report Status 08/04/2023 FINAL  Final  Urine Culture (for pregnant, neutropenic or urologic patients or patients with an indwelling urinary catheter)     Status: None   Collection Time: 07/30/23 11:03 PM   Specimen: Urine, Suprapubic  Result Value Ref Range Status   Specimen Description URINE, SUPRAPUBIC  Final   Special Requests NONE  Final   Culture   Final    NO GROWTH Performed at [facility], [address], [place], [place] [address]    Report Status 08/01/2023 FINAL  Final  MRSA Next Gen by PCR, Nasal     Status: None   Collection Time: 07/31/23  1:00 AM   Specimen: Nasal Mucosa; Nasal Swab  Result Value Ref Range Status   MRSA by PCR Next Gen NOT DETECTED NOT DETECTED Final    Comment: (NOTE) The GeneXpert MRSA Assay (FDA approved for NASAL specimens only), is one component of Deliah Strehlow comprehensive MRSA colonization surveillance program. It is not intended to diagnose MRSA infection nor to guide or monitor treatment for MRSA infections. Test performance is not FDA approved in patients less than 55 years old. Performed at [facility], [address], [place], [place] [address]           Radiology Studies: No results found.      Scheduled Meds:  atorvastatin  40 mg Oral QHS   baclofen  5 mg Oral TID   Chlorhexidine Gluconate Cloth  6 each Topical Daily   DULoxetine  30 mg Oral Daily   enoxaparin (LOVENOX) injection  40 mg Subcutaneous Q24H   famotidine  20 mg Oral BID   furosemide  40 mg Intravenous Q12H   Gerhardt's butt cream   Topical TID   insulin aspart  0-5 Units Subcutaneous QHS   insulin aspart  0-9 Units Subcutaneous TID WC   insulin aspart  3 Units Subcutaneous TID WC   insulin glargine-yfgn  10 Units Subcutaneous Daily   lamoTRIgine  25 mg Oral QHS   Followed by   Melene Muller ON 08/06/2023] lamoTRIgine  25 mg Oral BID   Followed by   Melene Muller ON 08/09/2023] lamoTRIgine  25 mg Oral Daily   Followed by   Melene Muller ON 08/12/2023] lamoTRIgine  50 mg Oral BID   [START ON 08/09/2023] lamoTRIgine  50 mg Oral QHS   leptospermum manuka honey  1 Application Topical Daily   mouth rinse  15 mL Mouth Rinse 4 times per day   polyethylene glycol  17 g Oral Daily   QUEtiapine  25 mg Oral BID   Continuous Infusions:  piperacillin-tazobactam (ZOSYN)  IV 3.375 g (08/05/23 1315)     LOS: 5 days    Time spent: over 30 min    Lacretia Nicks, MD [facility]   To contact the attending provider between 7A-7P  or the covering provider during after hours 7P-7A, please log into the web site [email or web address] and access using universal [place] password for that web site. If you do not have the password, please call the hospital operator.  08/05/2023, 2:33 PM

## 2023-08-05 NOTE — Plan of Care (Signed)
  Problem: Coping: Goal: Ability to adjust to condition or change in health will improve Outcome: Progressing   Problem: Fluid Volume: Goal: Ability to maintain a balanced intake and output will improve Outcome: Progressing

## 2023-08-06 ENCOUNTER — Inpatient Hospital Stay (HOSPITAL_COMMUNITY): Payer: Medicare Other

## 2023-08-06 DIAGNOSIS — G9340 Encephalopathy, unspecified: Secondary | ICD-10-CM | POA: Diagnosis not present

## 2023-08-06 LAB — GLUCOSE, CAPILLARY
Glucose-Capillary: 236 mg/dL — ABNORMAL HIGH (ref 70–99)
Glucose-Capillary: 247 mg/dL — ABNORMAL HIGH (ref 70–99)
Glucose-Capillary: 263 mg/dL — ABNORMAL HIGH (ref 70–99)
Glucose-Capillary: 330 mg/dL — ABNORMAL HIGH (ref 70–99)
Glucose-Capillary: 338 mg/dL — ABNORMAL HIGH (ref 70–99)
Glucose-Capillary: 253 mg/dL — ABNORMAL HIGH (ref 70–99)

## 2023-08-06 LAB — CBC WITH DIFFERENTIAL/PLATELET
Abs Immature Granulocytes: 0.04 10*3/uL (ref 0.00–0.07)
Basophils Absolute: 0 10*3/uL (ref 0.0–0.1)
Basophils Relative: 1 %
Eosinophils Absolute: 0.1 10*3/uL (ref 0.0–0.5)
Eosinophils Relative: 2 %
HCT: 34 % — ABNORMAL LOW (ref 39.0–52.0)
Hemoglobin: 10.3 g/dL — ABNORMAL LOW (ref 13.0–17.0)
Immature Granulocytes: 1 %
Lymphocytes Relative: 22 %
Lymphs Abs: 1.4 10*3/uL (ref 0.7–4.0)
MCH: 25 pg — ABNORMAL LOW (ref 26.0–34.0)
MCHC: 30.3 g/dL (ref 30.0–36.0)
MCV: 82.5 fL (ref 80.0–100.0)
Monocytes Absolute: 0.6 10*3/uL (ref 0.1–1.0)
Monocytes Relative: 8 %
Neutro Abs: 4.4 10*3/uL (ref 1.7–7.7)
Neutrophils Relative %: 66 %
Platelets: 280 10*3/uL (ref 150–400)
RBC: 4.12 MIL/uL — ABNORMAL LOW (ref 4.22–5.81)
RDW: 16.7 % — ABNORMAL HIGH (ref 11.5–15.5)
WBC: 6.6 10*3/uL (ref 4.0–10.5)
nRBC: 0 % (ref 0.0–0.2)

## 2023-08-06 LAB — COMPREHENSIVE METABOLIC PANEL
ALT: 55 U/L — ABNORMAL HIGH (ref 0–44)
AST: 44 U/L — ABNORMAL HIGH (ref 15–41)
Albumin: 2.4 g/dL — ABNORMAL LOW (ref 3.5–5.0)
Alkaline Phosphatase: 129 U/L — ABNORMAL HIGH (ref 38–126)
Anion gap: 12 (ref 5–15)
BUN: 12 mg/dL (ref 8–23)
CO2: 28 mmol/L (ref 22–32)
Calcium: 8.7 mg/dL — ABNORMAL LOW (ref 8.9–10.3)
Chloride: 96 mmol/L — ABNORMAL LOW (ref 98–111)
Creatinine, Ser: 1.02 mg/dL (ref 0.61–1.24)
GFR, Estimated: >60 mL/min (ref >60)
Glucose, Bld: 331 mg/dL — ABNORMAL HIGH (ref 70–99)
Potassium: 3.6 mmol/L (ref 3.5–5.1)
Sodium: 136 mmol/L (ref 135–145)
Total Bilirubin: 0.5 mg/dL (ref <1.2)
Total Protein: 5.7 g/dL — ABNORMAL LOW (ref 6.5–8.1)

## 2023-08-06 MED ORDER — POTASSIUM CHLORIDE CRYS ER 20 MEQ PO TBCR
40.0000 meq | EXTENDED_RELEASE_TABLET | ORAL | Status: AC
  Administered 2023-08-06 (×2): 40 meq via ORAL
  Filled 2023-08-06 (×2): qty 2

## 2023-08-06 MED ORDER — QUETIAPINE FUMARATE 25 MG PO TABS
25.0000 mg | ORAL_TABLET | Freq: Every day | ORAL | Status: DC
  Administered 2023-08-06 – 2023-08-07 (×2): 25 mg via ORAL
  Filled 2023-08-06 (×2): qty 1

## 2023-08-06 MED ORDER — INSULIN ASPART 100 UNIT/ML IJ SOLN
0.0000 [IU] | Freq: Every day | INTRAMUSCULAR | Status: DC
  Administered 2023-08-06: 3 [IU] via SUBCUTANEOUS

## 2023-08-06 MED ORDER — COSYNTROPIN 0.25 MG IJ SOLR
0.2500 mg | Freq: Once | INTRAMUSCULAR | Status: AC
  Administered 2023-08-07: 0.25 mg via INTRAVENOUS
  Filled 2023-08-06: qty 0.25

## 2023-08-06 MED ORDER — INSULIN GLARGINE-YFGN 100 UNIT/ML ~~LOC~~ SOLN
10.0000 [IU] | Freq: Two times a day (BID) | SUBCUTANEOUS | Status: DC
  Administered 2023-08-06 – 2023-08-07 (×2): 10 [IU] via SUBCUTANEOUS
  Filled 2023-08-06 (×3): qty 0.1

## 2023-08-06 MED ORDER — INSULIN ASPART 100 UNIT/ML IJ SOLN
0.0000 [IU] | Freq: Three times a day (TID) | INTRAMUSCULAR | Status: DC
  Administered 2023-08-06: 11 [IU] via SUBCUTANEOUS
  Administered 2023-08-07 – 2023-08-08 (×4): 8 [IU] via SUBCUTANEOUS
  Administered 2023-08-08: 5 [IU] via SUBCUTANEOUS

## 2023-08-06 MED ORDER — INSULIN ASPART 100 UNIT/ML IJ SOLN: 0.0000 [IU] | Freq: Three times a day (TID) | INTRAMUSCULAR | Status: DC

## 2023-08-06 NOTE — Plan of Care (Signed)
  Problem: Health Behavior/Discharge Planning: Goal: Ability to manage health-related needs will improve Outcome: Progressing   

## 2023-08-06 NOTE — Progress Notes (Addendum)
PROGRESS NOTE    Brent [name].  ZOX:[id number] DOB: 1945-12-19 DOA: 07/30/2023 PCP: [name], MD  No chief complaint on file.   Brief Narrative:   77 year old male with pertinent PMH quadriparesis/quadriplegia from transverse myelitis, HFpEF, hx of osteomyelitis of right fifth metatarsal and septic arthritis of fifth MTP joint on zosyn until 08/14/2023, GI fistula s/p wedge resection 05/2023, neurogenic bladder with suprapubic catheter, DMT2, htn, hld, asthma presents to [facility] ED on 11/10 with AMS.   Patient recently hospitalized on 10/11-10/17 w/ right foot osteomyelitis. OR cultures growing. Providencia/Proteus/E faecalis/Bacteroides. Scheduled to be on 6 weeks of IV Zosyn per ID until 08/14/2023.  Had Se[facility] Witczak multiple admissions in October due to sob from pleural effusions treated from chf treated w/ lasix. Recently discharged on 11/7 to [facility].    On 11/10 patient admitted to [facility] with AMS.  Per EMS was agitated earlier in the morning but not giving any medications.  Patient later in the evening found poorly responsive and EMS called.  Noted to be bradycardic 50s and hypoxic requiring Foot of Ten. CBG 58 given dextrose.  Patient remained altered. Patient given IV fluids and atropine for bradycardia.  Transferred to [facility]. Temp 95 F.  Cultures obtained and started on broad-spectrum antibiotics.  Given unresponsiveness patient intubated for airway protection.  CXR with layering left-sided pleural effusion.  CT head no acute abnormality. ABG WNL. UA w/ small leukocytes. LA 2.5 and wbc 9. PCCM consulted for icu admission.  11/11 admit to [facility] w/ AMS requiring intubation for gcs 1  11/12 extubated 11/13 transferred to [facility]  11/13-15 issues with delirium, seroquel increased to 25 mg BID 11/17 CXR with large L effusion   Discharge pending further improvement.  Needs PICC replaced prior to discharge.   Assessment & Plan:   Principal Problem:   Acute encephalopathy Active  Problems:   Acute respiratory failure (HCC)  Acute encephalopathy of unclear etiology but likely due to polypharmacy Delirium  Per report patient was agitated at nursing home (unclear if he received any medications for agitation) followed by being unresponsive  Concern from ICU regarding relative adrenal insufficiency considering he was bradycardic, hypotensive, hypoglycemic and hypoxic His mental status is improved significantly Initially home meds were held (lamictal, ativan, oxycodone, trazodone, baclofen) -> these have now been gradually restarted CT head was negative for acute findings TSH is normal Continue seroquel, adjust as needed give his delirium (he's done better recently, not needing restraints)   Possible relative adrenal insufficiency 11/11 cortisol 6.7, low normal S/p stress dose steroids Steroids d/c'd on 11/14 Stim test 11/18   Acute respiratory failure with hypoxia Possible aspiration pneumonia Recurrent Left Pleural Effusion Heart Failure with Preserved EF Patient was fully extubated 11/12 Continue IV Zosyn Currently on room air Continue BID IV lasix -> transition to oral lasix when able  Repeat CXR 11/17 AM -> large L effusion -> this has been persistent despite BID IV lasix, I think reasonable to pursue thora now (wife agreeable) - follow thora labs Currently appears euvolemic, will monitor   Sepsis, POA History of osteomyelitis of the right fifth metatarsal/septic arthritis of fifth MTP joint Currently on IV Zosyn for 6 weeks end date of 08/14/2023. Lactate has cleared Cultures have been negative He pulled picc with delirium -> PICC will need to be replaced prior to discharge back to SNF   Sinus bradycardia Improved, will monitor  TSH is normal Continue to hold atenolol    Hypokalemia Hypomagnesemia Prolonged qtc Continue aggressive  electrolyte replacement Repeat EKG with improved qtc   HTN/HLD Continue statin Fluctuating BP Holding  antihypertensive meds   DMT2 Last hemoglobin A1c was 7.8 Mealtime + SSI  Consider discharging on lower dose of basal insulin given presentation with hypoglycemia -> titrating as tolerated   Chronic anemia H&H is stable Closely monitor and transfuse if less than 7   Elevated LFT's Noted, will follow  CT 11/10 without focal liver abnormality seen   Paraplegia secondary to transverse myelitis Neurogenic bladder s/p suprapubic catheter Resume baclofen at lower dose, follow (at discharge, would lower baclofen dose) Was started on lamictal for dysesthesias, continue and monitor (resuming and uptitrating per pharmacy)   Stage IV decubitus ulcer POA Ischial decubitus ulcer POA Wound care consulted   Depression/anxiety Resume Cymbalta, trazodone   History of gastrocolic fistula S/p colostomy Continue colostomy care     DVT prophylaxis: lovenox Code Status: full Family Communication: none Disposition:   Status is: Inpatient Remains inpatient appropriate because: need for continued inpatient care   Consultants:  PCCM   Procedures:  Intubation/extubation  Antimicrobials:  Anti-infectives (From admission, onward)    Start     Dose/Rate Route Frequency Ordered Stop   07/31/23 0600  piperacillin-tazobactam (ZOSYN) IVPB 3.375 g        3.375 g 12.5 mL/hr over 240 Minutes Intravenous Every 8 hours 07/31/23 0049     07/30/23 2245  vancomycin (VANCOREADY) IVPB 2000 mg/400 mL        2,000 mg 200 mL/hr over 120 Minutes Intravenous  Once 07/30/23 2233 07/31/23 0115   07/30/23 2230  ceFEPIme (MAXIPIME) 2 g in sodium chloride 0.9 % 100 mL IVPB        2 g 200 mL/hr over 30 Minutes Intravenous  Once 07/30/23 2228 07/30/23 2247   07/30/23 2230  metroNIDAZOLE (FLAGYL) IVPB 500 mg        500 mg 100 mL/hr over 60 Minutes Intravenous  Once 07/30/23 2228 07/31/23 0016   07/30/23 2230  vancomycin (VANCOCIN) IVPB 1000 mg/200 mL premix  Status:  Discontinued        1,000 mg 200 mL/hr  over 60 Minutes Intravenous  Once 07/30/23 2228 07/30/23 2233       Subjective:  No complaints  Objective: Vitals:   08/06/23 0436 08/06/23 0849 08/06/23 1326 08/06/23 1533  BP:  (!) 150/70 (!) 160/65 (!) 169/73  Pulse:  78 75 80  Resp:  (!) 21 17   Temp:  98.4 F (36.9 C) 98.9 F (37.2 C) 97.6 F (36.4 C)  TempSrc:  Oral Oral Oral  SpO2:  97% 95% 100%  Weight: 90 kg     Height:        Intake/Output Summary (Last 24 hours) at 08/06/2023 1621 Last data filed at 08/06/2023 1554 Gross per 24 hour  Intake 363.38 ml  Output 2200 ml  Net -1836.62 ml   Filed Weights   08/03/23 0438 08/05/23 0359 08/06/23 0436  Weight: 90.8 kg 90.7 kg 90 kg    Examination:  General: No acute distress. Cardiovascular: RRR Lungs: unlabored Neurological: Alert . Bilateral LE weakness. Extremities: No clubbing or cyanosis. No edema.   Data Reviewed: I have personally reviewed following labs and imaging studies  CBC: Recent Labs  Lab 08/02/23 0859 08/03/23 0440 08/04/23 0443 08/05/23 0428 08/06/23 1239  WBC 6.4 6.2 7.5 8.1 6.6  NEUTROABS 4.5 4.5 5.5 4.9 4.4  HGB 9.6* 9.0* 9.4* 9.8* 10.3*  HCT 31.9* 30.0* 31.1* 33.2* 34.0*  MCV 81.8 80.9 80.6  83.4 82.5  PLT 375 338 302 260 280    Basic Metabolic Panel: Recent Labs  Lab 07/31/23 2029 07/31/23 2212 08/02/23 0859 08/03/23 0440 08/04/23 0443 08/05/23 0428 08/06/23 1239  NA  --    < > 139 134* 134* 137 136  K  --    < > 3.2* 3.2* 3.2* 4.3 3.6  CL  --   --  102 98 99 102 96*  CO2  --   --  27 26 25 24 28   GLUCOSE  --   --  241* 274* 193* 206* 331*  BUN  --   --  13 15 13 13 12   CREATININE  --   --  0.95 1.22 0.83 0.74 1.02  CALCIUM  --   --  9.0 8.8* 8.7* 8.5* 8.7*  MG 2.4  --  1.7 1.6* 1.8 1.6*  --   PHOS  --   --  3.3 3.0 2.0* 3.0  --    < > = values in this interval not displayed.    GFR: Estimated Creatinine Clearance: 67.5 mL/min (by C-G formula based on SCr of 1.02 mg/dL).  Liver Function Tests: Recent Labs   Lab 08/02/23 0859 08/03/23 0440 08/04/23 0443 08/05/23 0428 08/06/23 1239  AST 95* 108* 68* 43* 44*  ALT 69* 87* 80* 62* 55*  ALKPHOS 102 114 108 106 129*  BILITOT 0.5 0.4 0.5 0.2 0.5  PROT 6.1* 5.8* 5.7* 5.5* 5.7*  ALBUMIN 2.7* 2.6* 2.6* 2.6* 2.4*    CBG: Recent Labs  Lab 08/05/23 2114 08/05/23 2253 08/06/23 0433 08/06/23 0615 08/06/23 1328  GLUCAP 338* 279* 236* 247* 263*     Recent Results (from the past 240 hour(s))  Blood Culture (routine x 2)     Status: None   Collection Time: 07/30/23 10:22 PM   Specimen: BLOOD LEFT HAND  Result Value Ref Range Status   Specimen Description BLOOD LEFT HAND  Final   Special Requests   Final    BOTTLES DRAWN AEROBIC AND ANAEROBIC Blood Culture adequate volume   Culture   Final    NO GROWTH 5 DAYS Performed at [facility], [address], [place], [place] [address]    Report Status 08/04/2023 FINAL  Final  Resp panel by RT-PCR (RSV, Flu Seven [place]&B, Covid) Anterior Nasal Swab     Status: None   Collection Time: 07/30/23 10:25 PM   Specimen: Anterior Nasal Swab  Result Value Ref Range Status   SARS Coronavirus 2 by RT PCR NEGATIVE NEGATIVE Final   Influenza Latavia Goga by PCR NEGATIVE NEGATIVE Final   Influenza B by PCR NEGATIVE NEGATIVE Final    Comment: (NOTE) The Xpert Xpress SARS-CoV-2/FLU/RSV plus assay is intended as an aid in the diagnosis of influenza from Nasopharyngeal swab specimens and should not be used as Darcey Cardy sole basis for treatment. Nasal washings and aspirates are unacceptable for Xpert Xpress SARS-CoV-2/FLU/RSV testing.  Fact Sheet for Patients: BloggerCourse.com  Fact Sheet for Healthcare Providers: SeriousBroker.it  This test is not yet approved or cleared by the [place] FDA and has been authorized for detection and/or diagnosis of SARS-CoV-2 by FDA under an Emergency Use Authorization (EUA). This EUA will remain in effect (meaning this test can be used)  for the duration of the COVID-19 declaration under Section 564(b)(1) of the Act, 21 U.S.C. section 360bbb-3(b)(1), unless the authorization is terminated or revoked.     Resp Syncytial Virus by PCR NEGATIVE NEGATIVE Final    Comment: (NOTE) Fact  Sheet for Patients: BloggerCourse.com  Fact Sheet for Healthcare Providers: SeriousBroker.it  This test is not yet approved or cleared by the [place] FDA and has been authorized for detection and/or diagnosis of SARS-CoV-2 by FDA under an Emergency Use Authorization (EUA). This EUA will remain in effect (meaning this test can be used) for the duration of the COVID-19 declaration under Section 564(b)(1) of the Act, 21 U.S.C. section 360bbb-3(b)(1), unless the authorization is terminated or revoked.  Performed at [facility], [address] [place], [place] [address]   Blood Culture (routine x 2)     Status: None   Collection Time: 07/30/23 10:30 PM   Specimen: BLOOD  Result Value Ref Range Status   Specimen Description BLOOD LEFT ANTECUBITAL  Final   Special Requests   Final    BOTTLES DRAWN AEROBIC AND ANAEROBIC Blood Culture adequate volume   Culture   Final    NO GROWTH 5 DAYS Performed at [facility], [address] [place], [place] [address]    Report Status 08/04/2023 FINAL  Final  Urine Culture (for pregnant, neutropenic or urologic patients or patients with an indwelling urinary catheter)     Status: None   Collection Time: 07/30/23 11:03 PM   Specimen: Urine, Suprapubic  Result Value Ref Range Status   Specimen Description URINE, SUPRAPUBIC  Final   Special Requests NONE  Final   Culture   Final    NO GROWTH Performed at [facility], [address], [place], [place] [address]    Report Status 08/01/2023 FINAL  Final  MRSA Next Gen by PCR, Nasal     Status: None   Collection Time: 07/31/23  1:00 AM   Specimen: Nasal Mucosa; Nasal Swab   Result Value Ref Range Status   MRSA by PCR Next Gen NOT DETECTED NOT DETECTED Final    Comment: (NOTE) The GeneXpert MRSA Assay (FDA approved for NASAL specimens only), is one component of Butch Otterson comprehensive MRSA colonization surveillance program. It is not intended to diagnose MRSA infection nor to guide or monitor treatment for MRSA infections. Test performance is not FDA approved in patients less than 57 years old. Performed at [facility], [address] [place], [place] [address]          Radiology Studies: DG CHEST PORT 1 VIEW  Result Date: 08/06/2023 CLINICAL DATA:  Effusion. EXAM: PORTABLE CHEST 1 VIEW COMPARISON:  One-view chest x-ray 07/31/2023 FINDINGS: The heart size is normal. Jessic Standifer large left pleural effusion is again noted. Mild edema is present. The right lung is otherwise clear. Degenerative changes are present at the shoulders. IMPRESSION: 1. Large left pleural effusion. 2. Mild edema. Electronically Signed   By: Marin Roberts M.D.   On: 08/06/2023 08:20        Scheduled Meds:  atorvastatin  40 mg Oral QHS   baclofen  5 mg Oral TID   Chlorhexidine Gluconate Cloth  6 each Topical Daily   DULoxetine  30 mg Oral Daily   enoxaparin (LOVENOX) injection  40 mg Subcutaneous Q24H   famotidine  20 mg Oral BID   furosemide  40 mg Intravenous Q12H   Gerhardt's butt cream   Topical TID   insulin aspart  0-5 Units Subcutaneous QHS   insulin aspart  0-9 Units Subcutaneous TID WC   insulin aspart  3 Units Subcutaneous TID WC   insulin glargine-yfgn  10 Units Subcutaneous Daily   lamoTRIgine  25 mg Oral  BID   Followed by   Melene Muller ON 08/09/2023] lamoTRIgine  25 mg Oral Daily   Followed by   Melene Muller ON 08/12/2023] lamoTRIgine  50 mg Oral BID   [START ON 08/09/2023] lamoTRIgine  50 mg Oral QHS   leptospermum manuka honey  1 Application Topical Daily   mouth rinse  15 mL Mouth Rinse 4 times per day   polyethylene glycol  17 g Oral Daily   QUEtiapine  25 mg  Oral BID   Continuous Infusions:  piperacillin-tazobactam (ZOSYN)  IV 3.375 g (08/06/23 1327)     LOS: 6 days    Time spent: over 30 min    Lacretia Nicks, MD [facility]   To contact the attending provider between 7A-7P or the covering provider during after hours 7P-7A, please log into the web site [email or web address] and access using universal [place] password for that web site. If you do not have the password, please call the hospital operator.  08/06/2023, 4:21 PM

## 2023-08-06 NOTE — Plan of Care (Signed)
  Problem: Education: Goal: Ability to describe self-care measures that may prevent or decrease complications (Diabetes Survival Skills Education) will improve Outcome: Progressing Goal: Individualized Educational Video(s) Outcome: Progressing   Problem: Coping: Goal: Ability to adjust to condition or change in health will improve Outcome: Progressing   Problem: Fluid Volume: Goal: Ability to maintain a balanced intake and output will improve Outcome: Progressing   Problem: Nutritional: Goal: Maintenance of adequate nutrition will improve Outcome: Progressing Goal: Progress toward achieving an optimal weight will improve Outcome: Progressing   Problem: Activity: Goal: Risk for activity intolerance will decrease Outcome: Progressing   Problem: Elimination: Goal: Will not experience complications related to bowel motility Outcome: Progressing Goal: Will not experience complications related to urinary retention Outcome: Progressing   Problem: Nutrition: Goal: Adequate nutrition will be maintained Outcome: Progressing   Problem: Safety: Goal: Ability to remain free from injury will improve Outcome: Progressing   Problem: Pain Management: Goal: General experience of comfort will improve Outcome: Progressing

## 2023-08-07 ENCOUNTER — Other Ambulatory Visit: Payer: Self-pay

## 2023-08-07 ENCOUNTER — Inpatient Hospital Stay (HOSPITAL_COMMUNITY): Payer: Medicare Other

## 2023-08-07 DIAGNOSIS — G9340 Encephalopathy, unspecified: Secondary | ICD-10-CM | POA: Diagnosis not present

## 2023-08-07 LAB — CBC WITH DIFFERENTIAL/PLATELET
Abs Immature Granulocytes: 0.05 10*3/uL (ref 0.00–0.07)
Basophils Absolute: 0.1 10*3/uL (ref 0.0–0.1)
Basophils Relative: 1 %
Eosinophils Absolute: 0.2 10*3/uL (ref 0.0–0.5)
Eosinophils Relative: 2 %
HCT: 34.1 % — ABNORMAL LOW (ref 39.0–52.0)
Hemoglobin: 10.4 g/dL — ABNORMAL LOW (ref 13.0–17.0)
Immature Granulocytes: 1 %
Lymphocytes Relative: 22 %
Lymphs Abs: 1.7 10*3/uL (ref 0.7–4.0)
MCH: 24.9 pg — ABNORMAL LOW (ref 26.0–34.0)
MCHC: 30.5 g/dL (ref 30.0–36.0)
MCV: 81.6 fL (ref 80.0–100.0)
Monocytes Absolute: 0.6 10*3/uL (ref 0.1–1.0)
Monocytes Relative: 9 %
Neutro Abs: 4.9 10*3/uL (ref 1.7–7.7)
Neutrophils Relative %: 65 %
Platelets: 240 10*3/uL (ref 150–400)
RBC: 4.18 MIL/uL — ABNORMAL LOW (ref 4.22–5.81)
RDW: 16.9 % — ABNORMAL HIGH (ref 11.5–15.5)
WBC: 7.5 10*3/uL (ref 4.0–10.5)
nRBC: 0 % (ref 0.0–0.2)

## 2023-08-07 LAB — COMPREHENSIVE METABOLIC PANEL
ALT: 53 U/L — ABNORMAL HIGH (ref 0–44)
AST: 39 U/L (ref 15–41)
Albumin: 2.7 g/dL — ABNORMAL LOW (ref 3.5–5.0)
Alkaline Phosphatase: 130 U/L — ABNORMAL HIGH (ref 38–126)
Anion gap: 12 (ref 5–15)
BUN: 11 mg/dL (ref 8–23)
CO2: 30 mmol/L (ref 22–32)
Calcium: 9.1 mg/dL (ref 8.9–10.3)
Chloride: 94 mmol/L — ABNORMAL LOW (ref 98–111)
Creatinine, Ser: 0.84 mg/dL (ref 0.61–1.24)
GFR, Estimated: >60 mL/min (ref >60)
Glucose, Bld: 282 mg/dL — ABNORMAL HIGH (ref 70–99)
Potassium: 4.2 mmol/L (ref 3.5–5.1)
Sodium: 136 mmol/L (ref 135–145)
Total Bilirubin: 0.5 mg/dL (ref <1.2)
Total Protein: 6.4 g/dL — ABNORMAL LOW (ref 6.5–8.1)

## 2023-08-07 LAB — MAGNESIUM: Magnesium: 1.7 mg/dL (ref 1.7–2.4)

## 2023-08-07 LAB — ACTH STIMULATION, 3 TIME POINTS
Cortisol, 30 Min: 23.4 ug/dL
Cortisol, 60 Min: 28.7 ug/dL
Cortisol, Base: 11.1 ug/dL

## 2023-08-07 LAB — GLUCOSE, CAPILLARY
Glucose-Capillary: 230 mg/dL — ABNORMAL HIGH (ref 70–99)
Glucose-Capillary: 255 mg/dL — ABNORMAL HIGH (ref 70–99)
Glucose-Capillary: 270 mg/dL — ABNORMAL HIGH (ref 70–99)
Glucose-Capillary: 273 mg/dL — ABNORMAL HIGH (ref 70–99)

## 2023-08-07 LAB — PHOSPHORUS: Phosphorus: 3 mg/dL (ref 2.5–4.6)

## 2023-08-07 LAB — LACTATE DEHYDROGENASE: LDH: 151 U/L (ref 98–192)

## 2023-08-07 MED ORDER — FUROSEMIDE 40 MG PO TABS
40.0000 mg | ORAL_TABLET | Freq: Every day | ORAL | Status: DC
  Administered 2023-08-08: 40 mg via ORAL
  Filled 2023-08-07: qty 1

## 2023-08-07 MED ORDER — SODIUM CHLORIDE 0.9% FLUSH
10.0000 mL | Freq: Two times a day (BID) | INTRAVENOUS | Status: DC
  Administered 2023-08-07 (×2): 10 mL

## 2023-08-07 MED ORDER — INSULIN GLARGINE-YFGN 100 UNIT/ML ~~LOC~~ SOLN
14.0000 [IU] | Freq: Two times a day (BID) | SUBCUTANEOUS | Status: DC
  Administered 2023-08-07 – 2023-08-08 (×2): 14 [IU] via SUBCUTANEOUS
  Filled 2023-08-07 (×3): qty 0.14

## 2023-08-07 MED ORDER — SODIUM CHLORIDE 0.9% FLUSH: 10.0000 mL | INTRAVENOUS | Status: DC | PRN

## 2023-08-07 NOTE — Progress Notes (Signed)
Telephone consent obtained for PICC by patient's wife Latavion Brittan. PICC team at bedside, patient receiving care- to be bathed at this time. Will return later for PICC placement.

## 2023-08-07 NOTE — Progress Notes (Signed)
Peripherally Inserted Central Catheter Placement  The IV Nurse has discussed with the patient and/or persons authorized to consent for the patient, the purpose of this procedure and the potential benefits and risks involved with this procedure.  The benefits include less needle sticks, lab draws from the catheter, and the patient may be discharged home with the catheter. Risks include, but not limited to, infection, bleeding, blood clot (thrombus formation), and puncture of an artery; nerve damage and irregular heartbeat and possibility to perform a PICC exchange if needed/ordered by physician.  Alternatives to this procedure were also discussed.  Bard Power PICC patient education guide, fact sheet on infection prevention and patient information card has been provided to patient /or left at bedside.    PICC Placement Documentation  PICC Single Lumen 08/07/23 Right Brachial 40 cm 0 cm (Active)  Indication for Insertion or Continuance of Line Home intravenous therapies (PICC only) 08/07/23 1337  Exposed Catheter (cm) 0 cm 08/07/23 1337  Site Assessment Clean, Dry, Intact 08/07/23 1337  Line Status Flushed;Blood return noted;Saline locked 08/07/23 1337  Dressing Type Transparent 08/07/23 1337  Dressing Status Antimicrobial disc in place 08/07/23 1337  Line Care Connections checked and tightened 08/07/23 1337  Line Adjustment (NICU/IV Team Only) No 08/07/23 1337  Dressing Intervention New dressing 08/07/23 1337  Dressing Change Due 08/14/23 08/07/23 1337       Audrie Gallus 08/07/2023, 1:39 PM

## 2023-08-07 NOTE — Progress Notes (Signed)
PROGRESS NOTE    Sugar Lulgjuraj.  WUJ:[id number] DOB: 08/06/1946 DOA: 07/30/2023 PCP: [name], MD    Brief Narrative:  77 year old male with pertinent PMH quadriparesis/quadriplegia from transverse myelitis, HFpEF, hx of osteomyelitis of right fifth metatarsal and septic arthritis of fifth MTP joint on zosyn until 08/14/2023, GI fistula s/p wedge resection 05/2023, neurogenic bladder with suprapubic catheter, DMT2, htn, hld, asthma presents to [facility] ED on 11/10 with AMS.   Patient recently hospitalized on 10/11-10/17 w/ right foot osteomyelitis. OR cultures with Providencia/Proteus/E faecalis/Bacteroides. Scheduled to be on 6 weeks of IV Zosyn per ID until 08/14/2023.  Had multiple admissions in October due to sob from pleural effusions secondary to CHF and treated w/ lasix. Recently discharged on 11/7 to [facility].    11/11 admit to [facility] w/ AMS requiring intubation for gcs 1  11/12 extubated 11/13 transferred to [facility]  11/13-15 issues with delirium, seroquel increased to 25 mg BID- now on 25 mg at bedtime.     Subjective: Patient seen in morning rounds.  He came back from radiology.  There was not enough fluid collection to safely do thoracentesis.  Patient denies any shortness of breath.  Denies any other complaints.  No other overnight events. Assessment & Plan:   Acute encephalopathy, likely secondary to polypharmacy and delirium: Apparently agitated at nursing home and found to be unresponsive afterwards.  Not known whether he was given him any medications. Suspected adrenal insufficiency due to presentation with bradycardia, hypotension and hypoglycemia.  Mental status has improved.  His medications were held that includes Lamictal, Ativan, oxycodone, trazodone and baclofen.  Gradually restarting. CT head was negative for acute findings.  TSH was normal. Mental status is gradually improving.  Now using Seroquel 25 mg at night only.  Acute respiratory failure  with hypoxemia, possible aspiration pneumonia, left pleural effusion.  Heart failure with preserved EF. Extubated to room air 11/12.  Continue IV Zosyn with previous plans to continue until 11/25.  He will need a PICC line. Currently on IV diuretics, responding well. Taken to thoracentesis, not enough fluid to drain.  Currently euvolemic.  Sepsis present on admission, history of osteomyelitis of the right foot: As previously planned IV Zosyn for 6 weeks, end date 11/25.  Repeat cultures has been negative.  Patient had pulled out his PICC line while delirious.  Will place PICC line back in today and anticipate discharge back to SNF tomorrow.  Suspected adrenal insufficiency: Fasting cortisol 6.7.  He was given a stress dose of steroids.  Steroids has been discontinued since 11/14.  Underwent ACTH stimulation test today, he has PICC cortisol of 28 that rules out adrenal insufficiency.    Type 2 diabetes, uncontrolled with hyperglycemia: On increasing dose of insulin today.  Will monitor and determine discharge doses.  Electrolytes: Replaced aggressively.  Paraplegia secondary to transverse myelitis, neurogenic bladder status post suprapubic catheter.  Lamictal, baclofen.  Supportive therapy.  On Cymbalta and trazodone. History of gastrocolic fistula status post colostomy: Continue colostomy care. Stage IV decubitus ulcer, ischial decubitus ulcer: Wound care following.   DVT prophylaxis: enoxaparin (LOVENOX) injection 40 mg Start: 07/31/23 1000   Code Status: Full code Family Communication: None at the bedside Disposition Plan: Status is: Inpatient Remains inpatient appropriate because: IV antibiotics     Consultants:  None  Procedures:  None  Antimicrobials:  Zosyn     Objective: Vitals:   08/07/23 0507 08/07/23 0818 08/07/23 1030 08/07/23 1205  BP:  (!) 172/58 Marland Kitchen)  153/77 (!) 144/88  Pulse:  74 83 88  Resp:  18 20 20   Temp:  98.5 F (36.9 C) 98.4 F (36.9 C) 98.4 F  (36.9 C)  TempSrc:  Oral Oral Oral  SpO2:  100% 100% 100%  Weight: 89.5 kg     Height:        Intake/Output Summary (Last 24 hours) at 08/07/2023 1409 Last data filed at 08/07/2023 1214 Gross per 24 hour  Intake 328.93 ml  Output 2150 ml  Net -1821.07 ml   Filed Weights   08/05/23 0359 08/06/23 0436 08/07/23 0507  Weight: 90.7 kg 90 kg 89.5 kg    Examination:  General exam: Appears comfortable.  Chronically sick looking but not in any distress. Respiratory system: No added sounds.  He has poor air entry at bases. Cardiovascular system: S1 & S2 heard,  Gastrointestinal system: Soft.  Nontender.  Colostomy present. Central nervous system: Alert and oriented.  Slow to respond. Weakness of both legs. Right foot with dressing intact.   Data Reviewed: I have personally reviewed following labs and imaging studies  CBC: Recent Labs  Lab 08/03/23 0440 08/04/23 0443 08/05/23 0428 08/06/23 1239 08/07/23 0518  WBC 6.2 7.5 8.1 6.6 7.5  NEUTROABS 4.5 5.5 4.9 4.4 4.9  HGB 9.0* 9.4* 9.8* 10.3* 10.4*  HCT 30.0* 31.1* 33.2* 34.0* 34.1*  MCV 80.9 80.6 83.4 82.5 81.6  PLT 338 302 260 280 240   Basic Metabolic Panel: Recent Labs  Lab 07/31/23 2029 07/31/23 2212 08/02/23 0859 08/03/23 0440 08/04/23 0443 08/05/23 0428 08/06/23 1239  NA  --    < > 139 134* 134* 137 136  K  --    < > 3.2* 3.2* 3.2* 4.3 3.6  CL  --   --  102 98 99 102 96*  CO2  --   --  27 26 25 24 28   GLUCOSE  --   --  241* 274* 193* 206* 331*  BUN  --   --  13 15 13 13 12   CREATININE  --   --  0.95 1.22 0.83 0.74 1.02  CALCIUM  --   --  9.0 8.8* 8.7* 8.5* 8.7*  MG 2.4  --  1.7 1.6* 1.8 1.6*  --   PHOS  --   --  3.3 3.0 2.0* 3.0  --    < > = values in this interval not displayed.   GFR: Estimated Creatinine Clearance: 67.5 mL/min (by C-G formula based on SCr of 1.02 mg/dL). Liver Function Tests: Recent Labs  Lab 08/02/23 0859 08/03/23 0440 08/04/23 0443 08/05/23 0428 08/06/23 1239  AST 95* 108*  68* 43* 44*  ALT 69* 87* 80* 62* 55*  ALKPHOS 102 114 108 106 129*  BILITOT 0.5 0.4 0.5 0.2 0.5  PROT 6.1* 5.8* 5.7* 5.5* 5.7*  ALBUMIN 2.7* 2.6* 2.6* 2.6* 2.4*   No results for input(s): "LIPASE", "AMYLASE" in the last 168 hours. No results for input(s): "AMMONIA" in the last 168 hours. Coagulation Profile: No results for input(s): "INR", "PROTIME" in the last 168 hours. Cardiac Enzymes: No results for input(s): "CKTOTAL", "CKMB", "CKMBINDEX", "TROPONINI" in the last 168 hours. BNP (last 3 results) No results for input(s): "PROBNP" in the last 8760 hours. HbA1C: No results for input(s): "HGBA1C" in the last 72 hours. CBG: Recent Labs  Lab 08/06/23 1759 08/06/23 2113 08/07/23 0637 08/07/23 0807 08/07/23 1203  GLUCAP 338* 253* 230* 255* 270*   Lipid Profile: No results for input(s): "CHOL", "HDL", "LDLCALC", "TRIG", "  CHOLHDL", "LDLDIRECT" in the last 72 hours. Thyroid Function Tests: No results for input(s): "TSH", "T4TOTAL", "FREET4", "T3FREE", "THYROIDAB" in the last 72 hours. Anemia Panel: No results for input(s): "VITAMINB12", "FOLATE", "FERRITIN", "TIBC", "IRON", "RETICCTPCT" in the last 72 hours. Sepsis Labs: No results for input(s): "PROCALCITON", "LATICACIDVEN" in the last 168 hours.  Recent Results (from the past 240 hour(s))  Blood Culture (routine x 2)     Status: None   Collection Time: 07/30/23 10:22 PM   Specimen: BLOOD LEFT HAND  Result Value Ref Range Status   Specimen Description BLOOD LEFT HAND  Final   Special Requests   Final    BOTTLES DRAWN AEROBIC AND ANAEROBIC Blood Culture adequate volume   Culture   Final    NO GROWTH 5 DAYS Performed at [facility], [address] [place], [place] [address]    Report Status 08/04/2023 FINAL  Final  Resp panel by RT-PCR (RSV, Flu A&B, Covid) Anterior Nasal Swab     Status: None   Collection Time: 07/30/23 10:25 PM   Specimen: Anterior Nasal Swab  Result Value Ref Range Status   SARS Coronavirus 2  by RT PCR NEGATIVE NEGATIVE Final   Influenza A by PCR NEGATIVE NEGATIVE Final   Influenza B by PCR NEGATIVE NEGATIVE Final    Comment: (NOTE) The Xpert Xpress SARS-CoV-2/FLU/RSV plus assay is intended as an aid in the diagnosis of influenza from Nasopharyngeal swab specimens and should not be used as a sole basis for treatment. Nasal washings and aspirates are unacceptable for Xpert Xpress SARS-CoV-2/FLU/RSV testing.  Fact Sheet for Patients: BloggerCourse.com  Fact Sheet for Healthcare Providers: SeriousBroker.it  This test is not yet approved or cleared by the [place] FDA and has been authorized for detection and/or diagnosis of SARS-CoV-2 by FDA under an Emergency Use Authorization (EUA). This EUA will remain in effect (meaning this test can be used) for the duration of the COVID-19 declaration under Section 564(b)(1) of the Act, 21 U.S.C. section 360bbb-3(b)(1), unless the authorization is terminated or revoked.     Resp Syncytial Virus by PCR NEGATIVE NEGATIVE Final    Comment: (NOTE) Fact Sheet for Patients: BloggerCourse.com  Fact Sheet for Healthcare Providers: SeriousBroker.it  This test is not yet approved or cleared by the [place] FDA and has been authorized for detection and/or diagnosis of SARS-CoV-2 by FDA under an Emergency Use Authorization (EUA). This EUA will remain in effect (meaning this test can be used) for the duration of the COVID-19 declaration under Section 564(b)(1) of the Act, 21 U.S.C. section 360bbb-3(b)(1), unless the authorization is terminated or revoked.  Performed at [facility], [address], [place], [place] [address]   Blood Culture (routine x 2)     Status: None   Collection Time: 07/30/23 10:30 PM   Specimen: BLOOD  Result Value Ref Range Status   Specimen Description BLOOD LEFT ANTECUBITAL  Final   Special  Requests   Final    BOTTLES DRAWN AEROBIC AND ANAEROBIC Blood Culture adequate volume   Culture   Final    NO GROWTH 5 DAYS Performed at [facility], [address] [place], [place] [address]    Report Status 08/04/2023 FINAL  Final  Urine Culture (for pregnant, neutropenic or urologic patients or patients with an indwelling urinary catheter)     Status: None   Collection Time: 07/30/23 11:03 PM   Specimen: Urine, Suprapubic  Result Value Ref Range Status   Specimen Description  URINE, SUPRAPUBIC  Final   Special Requests NONE  Final   Culture   Final    NO GROWTH Performed at [facility], [address], [place], [place] [address]    Report Status 08/01/2023 FINAL  Final  MRSA Next Gen by PCR, Nasal     Status: None   Collection Time: 07/31/23  1:00 AM   Specimen: Nasal Mucosa; Nasal Swab  Result Value Ref Range Status   MRSA by PCR Next Gen NOT DETECTED NOT DETECTED Final    Comment: (NOTE) The GeneXpert MRSA Assay (FDA approved for NASAL specimens only), is one component of a comprehensive MRSA colonization surveillance program. It is not intended to diagnose MRSA infection nor to guide or monitor treatment for MRSA infections. Test performance is not FDA approved in patients less than 88 years old. Performed at [facility], [address] [place], [place] [address]          Radiology Studies: IR US CHEST  Result Date: 08/07/2023 CLINICAL DATA:  Patient presents to interventional Radiology for evaluation for left thoracentesis. EXAM: CHEST ULTRASOUND COMPARISON:  None Available. FINDINGS: Limited ultrasound examination of the left lung field with patient in the right lateral decubitus position revealed scant pleural fluid. Fluid volume insufficient to safely perform thoracentesis. No procedure performed, images saved in epic. Image findings discussed with the patient. IMPRESSION: Scant left pleural fluid. No procedure performed. Ultrasound images  obtained by Alwyn Ren NP Electronically Signed   By: Irish Lack M.D.   On: 08/07/2023 12:29   [place] EKG SITE RITE  Result Date: 08/07/2023 If Site Rite image not attached, placement could not be confirmed due to current cardiac rhythm.  DG CHEST PORT 1 VIEW  Result Date: 08/06/2023 CLINICAL DATA:  Effusion. EXAM: PORTABLE CHEST 1 VIEW COMPARISON:  One-view chest x-ray 07/31/2023 FINDINGS: The heart size is normal. A large left pleural effusion is again noted. Mild edema is present. The right lung is otherwise clear. Degenerative changes are present at the shoulders. IMPRESSION: 1. Large left pleural effusion. 2. Mild edema. Electronically Signed   By: Marin Roberts M.D.   On: 08/06/2023 08:20        Scheduled Meds:  atorvastatin  40 mg Oral QHS   baclofen  5 mg Oral TID   Chlorhexidine Gluconate Cloth  6 each Topical Daily   DULoxetine  30 mg Oral Daily   enoxaparin (LOVENOX) injection  40 mg Subcutaneous Q24H   famotidine  20 mg Oral BID   furosemide  40 mg Intravenous Q12H   Gerhardt's butt cream   Topical TID   insulin aspart  0-15 Units Subcutaneous TID WC   insulin aspart  0-5 Units Subcutaneous QHS   insulin aspart  3 Units Subcutaneous TID WC   insulin glargine-yfgn  14 Units Subcutaneous BID   lamoTRIgine  25 mg Oral BID   Followed by   Melene Muller ON 08/09/2023] lamoTRIgine  25 mg Oral Daily   Followed by   Melene Muller ON 08/12/2023] lamoTRIgine  50 mg Oral BID   [START ON 08/09/2023] lamoTRIgine  50 mg Oral QHS   leptospermum manuka honey  1 Application Topical Daily   mouth rinse  15 mL Mouth Rinse 4 times per day   polyethylene glycol  17 g Oral Daily   QUEtiapine  25 mg Oral QHS   sodium chloride flush  10-40 mL Intracatheter Q12H   Continuous Infusions:  piperacillin-tazobactam (ZOSYN)  IV 3.375 g (08/07/23 1351)  LOS: 7 days    Time spent: 40 minutes    Dorcas Carrow, MD [facility]

## 2023-08-07 NOTE — Procedures (Signed)
Patient presented to IR today for thoracentesis evaluation. Limited ultrasound examination of the left lung field revealed scant pleural fluid. Fluid volume insufficient to safely perform thoracentesis. No procedure performed. Images obtained with patient in the right lateral decubitus position. Patient states he is unable to sit upright.   Images saved in Epic. Image findings discussed with patient.   Alwyn Ren, [place] [phone] 08/07/2023, 10:20 AM

## 2023-08-07 NOTE — Plan of Care (Signed)
  Problem: Education: Goal: Ability to describe self-care measures that may prevent or decrease complications (Diabetes Survival Skills Education) will improve Outcome: Progressing Goal: Individualized Educational Video(s) Outcome: Progressing   Problem: Skin Integrity: Goal: Risk for impaired skin integrity will decrease Outcome: Progressing   Problem: Nutritional: Goal: Maintenance of adequate nutrition will improve Outcome: Progressing Goal: Progress toward achieving an optimal weight will improve Outcome: Progressing   Problem: Activity: Goal: Risk for activity intolerance will decrease Outcome: Progressing   Problem: Safety: Goal: Ability to remain free from injury will improve Outcome: Progressing   Problem: Pain Management: Goal: General experience of comfort will improve Outcome: Progressing

## 2023-08-07 NOTE — Plan of Care (Signed)
  Problem: Education: Goal: Ability to describe self-care measures that may prevent or decrease complications (Diabetes Survival Skills Education) will improve Outcome: Progressing   Problem: Fluid Volume: Goal: Ability to maintain a balanced intake and output will improve Outcome: Progressing   Problem: Education: Goal: Knowledge of General Education information will improve Description: Including pain rating scale, medication(s)/side effects and non-pharmacologic comfort measures Outcome: Progressing   Problem: Clinical Measurements: Goal: Respiratory complications will improve Outcome: Progressing Goal: Cardiovascular complication will be avoided Outcome: Progressing   Problem: Activity: Goal: Risk for activity intolerance will decrease Outcome: Progressing   Problem: Coping: Goal: Level of anxiety will decrease Outcome: Progressing   Problem: Elimination: Goal: Will not experience complications related to bowel motility Outcome: Progressing

## 2023-08-07 NOTE — Inpatient Diabetes Management (Signed)
Inpatient Diabetes Program Recommendations  AACE/ADA: New Consensus Statement on Inpatient Glycemic Control (2015)  Target Ranges:  Prepandial:   less than 140 mg/dL      Peak postprandial:   less than 180 mg/dL (1-2 hours)      Critically ill patients:  140 - 180 mg/dL   Lab Results  Component Value Date   GLUCAP 270 (H) 08/07/2023   HGBA1C 7.5 (H) 06/06/2023    Review of Glycemic Control  Latest Reference Range & Units 08/06/23 06:15 08/06/23 13:28 08/06/23 16:16 08/06/23 17:59 08/06/23 21:13 08/07/23 06:37 08/07/23 08:07 08/07/23 12:03  Glucose-Capillary 70 - 99 mg/dL 469 (H) 629 (H) 528 (H) 338 (H) 253 (H) 230 (H) 255 (H) 270 (H)   Diabetes history: DM 2 Outpatient Diabetes medications: Novolog 0-15 units tid, Semglee 24 units bid, metformin 1000 mg bid Current orders for Inpatient glycemic control:  Semglee 10 units bid Novolog 0-15 units tid + hs Novolog 3 units tid meal coverage  Inpatient Diabetes Program Recommendations:    -  consider increasing Semglee to 14 units bid  Thanks,  Christena Deem RN, MSN, BC-ADM Inpatient Diabetes Coordinator Team Pager [phone] (8a-5p)

## 2023-08-07 NOTE — Progress Notes (Signed)
Patient off the unit to radiology for Procedure (Thoracocentesis)

## 2023-08-07 NOTE — Progress Notes (Signed)
Patient back to the unit, alert and oriented X3, vital signs stable, will continue to monitor

## 2023-08-08 DIAGNOSIS — G9340 Encephalopathy, unspecified: Secondary | ICD-10-CM | POA: Diagnosis not present

## 2023-08-08 LAB — GLUCOSE, CAPILLARY
Glucose-Capillary: 180 mg/dL — ABNORMAL HIGH (ref 70–99)
Glucose-Capillary: 208 mg/dL — ABNORMAL HIGH (ref 70–99)
Glucose-Capillary: 218 mg/dL — ABNORMAL HIGH (ref 70–99)
Glucose-Capillary: 259 mg/dL — ABNORMAL HIGH (ref 70–99)

## 2023-08-08 MED ORDER — LAMOTRIGINE 25 MG PO TABS: 50.0000 mg | ORAL_TABLET | Freq: Every day | ORAL | 0 refills | Status: DC

## 2023-08-08 MED ORDER — QUETIAPINE FUMARATE 25 MG PO TABS: 25.0000 mg | ORAL_TABLET | Freq: Every day | ORAL | 0 refills | Status: DC

## 2023-08-08 MED ORDER — LAMOTRIGINE 25 MG PO TABS: ORAL_TABLET | ORAL | 0 refills | Status: DC

## 2023-08-08 MED ORDER — BACLOFEN 5 MG PO TABS: 5.0000 mg | ORAL_TABLET | Freq: Three times a day (TID) | ORAL | Status: DC

## 2023-08-08 MED ORDER — LORAZEPAM 0.5 MG PO TABS: 0.5000 mg | ORAL_TABLET | Freq: Every day | ORAL | 0 refills | Status: DC | PRN

## 2023-08-08 NOTE — Plan of Care (Signed)
  Problem: Coping: Goal: Ability to adjust to condition or change in health will improve Outcome: Progressing   Problem: Fluid Volume: Goal: Ability to maintain a balanced intake and output will improve Outcome: Progressing   Problem: Nutritional: Goal: Maintenance of adequate nutrition will improve Outcome: Progressing   Problem: Skin Integrity: Goal: Risk for impaired skin integrity will decrease Outcome: Progressing   Problem: Tissue Perfusion: Goal: Adequacy of tissue perfusion will improve Outcome: Progressing   Problem: Clinical Measurements: Goal: Ability to maintain clinical measurements within normal limits will improve Outcome: Progressing Goal: Respiratory complications will improve Outcome: Progressing   Problem: Nutrition: Goal: Adequate nutrition will be maintained Outcome: Progressing   Problem: Elimination: Goal: Will not experience complications related to urinary retention Outcome: Progressing   Problem: Safety: Goal: Ability to remain free from injury will improve Outcome: Progressing

## 2023-08-08 NOTE — Discharge Summary (Signed)
Physician Discharge Summary  Brent [name]. ZOX:[id number] DOB: 09/03/1946 DOA: 07/30/2023  PCP: [name], MD  Admit date: 07/30/2023 Discharge date: 08/08/2023  Admitted From: Long-term nursing home Disposition: Long-term care nursing home  Recommendations for Outpatient Follow-up:  Follow up with PCP in 1-2 weeks Please obtain BMP/CBC in one week Remove PICC line after completing antibiotic therapy on 11/26   Discharge Condition: Stable CODE STATUS: Full code Diet recommendation: Regular diet, nutritional supplements, low-salt and low-carb  Discharge summary: 77 year old male with pertinent PMH quadriparesis/quadriplegia from transverse myelitis, HFpEF, hx of osteomyelitis of right fifth metatarsal and septic arthritis of fifth MTP joint on zosyn until 08/14/2023, GI fistula s/p wedge resection 05/2023, neurogenic bladder with suprapubic catheter, DMT2, htn, hld, asthma presented to [facility] ED on 11/10 with AMS.   Patient recently hospitalized on 10/11-10/17 w/ right foot osteomyelitis. OR cultures with Providencia/Proteus/E faecalis/Bacteroides. Scheduled to be on 6 weeks of IV Zosyn per ID until 08/14/2023.  Had multiple admissions in October due to sob from pleural effusions secondary to CHF and treated w/ lasix. Recently discharged on 11/7 to [facility] where he is a long-term resident since last 3 years.   11/11 admit to [facility] w/ AMS requiring intubation for gcs 1  11/12 extubated 11/13 transferred to [facility]  11/13-15 issues with delirium, seroquel increased to 25 mg BID- now on 25 mg at bedtime.  Symptoms are well-controlled.  Able to discharge back to long-term care today.     Assessment & plan of care:   Acute encephalopathy, likely secondary to polypharmacy and delirium:  Apparently agitated at nursing home and found to be unresponsive afterwards.  Not known whether he was given him any sedating medications. Initially suspected adrenal insufficiency due  to presentation with bradycardia, hypotension and hypoglycemia.  Mental status has improved.  His medications were initially held and gradually reintroduced.   Back on escalating dose of Lamictal, Ativan, oxycodone, trazodone and low-dose of baclofen.   CT head was negative for acute findings.  TSH was normal. Mental status is gradually improving.  Now using Seroquel 25 mg at night only.   Acute respiratory failure with hypoxemia, possible aspiration pneumonia, left pleural effusion.  Heart failure with preserved EF. Extubated to room air 11/12.  Continue IV Zosyn with previous plans to continue until 11/25.  He was given a PICC line. Also treated with IV diuretics.  Responded well.  Now euvolemic and on room air.  Back on Lasix 40 mg daily with potassium supplements.  Attempted thoracentesis, not enough fluid to drain.   Sepsis present on admission, history of osteomyelitis of the right foot: As previously planned IV Zosyn for 6 weeks, end date 11/25.  Repeat cultures has been negative.  Patient had pulled out his PICC line while delirious.  New PICC line was placed.  Discharging with IV Zosyn to continue until 11/25.  Please discontinue PICC line at the end of IV antibiotic therapy.  Right plantar surgical sutures present for 5 weeks, removed before discharge today.  Suspected adrenal insufficiency: Ruled out. Fasting cortisol 6.7.  He was given a stress dose of steroids.  Steroids has been discontinued since 11/14.  Underwent ACTH stimulation test, he has Peak cortisol of 28 that rules out adrenal insufficiency.     Type 2 diabetes, uncontrolled with hyperglycemia: Mental status has improved.  Appetite has improved.  Resume his usual dose of insulin.   Electrolytes: Replaced aggressively and add great.   Paraplegia secondary to transverse  myelitis, neurogenic bladder status post suprapubic catheter.  Lamictal, baclofen.  Supportive therapy.  On Cymbalta and trazodone.  History of gastrocolic  fistula status post colostomy: Continue colostomy care.  Adequate bowel regimen.  Stage IV decubitus ulcer, ischial decubitus ulcer: Wound care instructions attached.   Chronically sick but medically stable today.  Able to go back to his long-term nursing home today.   Discharge Diagnoses:  Principal Problem:   Acute encephalopathy Active Problems:   Acute respiratory failure [facility])    Discharge Instructions  Discharge Instructions     Diet - low sodium heart healthy   Complete by: As directed    Diet Carb Modified   Complete by: As directed    Discharge wound care:   Complete by: As directed    Wound care  Daily      Comments: 1.         Clean plantar foot intact sutures with NS, apply Xeroform to incision site daily, cover with dry gauze or silicone foam.  2.Clean Sacral and Ischial wounds with Vashe wound cleanser [name] [phone]), apply Medihoney to wound beds daily, cover with dry gauze making sure to pack into any areas of depth. Apply Gerhardt's Butt Cream to surrounding skin of sacrum/coccyx/buttocks 3 times daily and prn soiling. May cover with ABD pad or silicone foams whichever is preferred  1   Increase activity slowly   Complete by: As directed       Allergies as of 08/08/2023       Reactions   Cipro [ciprofloxacin Hcl] Other (See Comments)   Hallucinations Allergy not listed on MAR   Neurontin [gabapentin] Other (See Comments)   Unknown reaction   Levaquin [levofloxacin] Rash        Medication List     STOP taking these medications    omeprazole 40 MG capsule Commonly known as: PRILOSEC   oxyCODONE 5 MG immediate release tablet Commonly known as: Oxy IR/ROXICODONE       TAKE these medications    acetaminophen 500 MG tablet Commonly known as: TYLENOL Take 2 tablets (1,000 mg total) by mouth every 6 (six) hours as needed for mild pain (or Fever >/= 101).   ascorbic acid 500 MG tablet Commonly known as: VITAMIN C Take 500 mg by mouth  daily.   atenolol 25 MG tablet Commonly known as: TENORMIN Take 12.5 mg by mouth in the morning.   atorvastatin 40 MG tablet Commonly known as: LIPITOR Take 1 tablet (40 mg total) by mouth at bedtime.   Baclofen 5 MG Tabs Take 1 tablet (5 mg total) by mouth 3 (three) times daily. What changed:  medication strength how much to take   Biofreeze 4 % Gel Generic drug: Menthol (Topical Analgesic) Apply 1 application  topically 2 (two) times daily as needed (pain to back (upper and lower),arms and shoulders).   cholecalciferol 25 MCG (1000 UNIT) tablet Commonly known as: VITAMIN D3 Take 2,000 Units by mouth daily.   DULoxetine 30 MG capsule Commonly known as: CYMBALTA Take 1 capsule (30 mg total) by mouth at bedtime.   famotidine 20 MG tablet Commonly known as: PEPCID Take 20 mg by mouth 2 (two) times daily.   ferrous sulfate 325 (65 FE) MG tablet Take 1 tablet (325 mg total) by mouth every other day.   furosemide 40 MG tablet Commonly known as: LASIX Take 1 tablet (40 mg total) by mouth daily.   GenTeal Tears Severe Day/Night 0.4-0.3 % Gel ophthalmic gel Generic drug: Polyethyl  Glycol-Propyl Glycol Place 1 Application into both eyes 4 (four) times daily as needed (dry eyes).   guaiFENesin 600 MG 12 hr tablet Commonly known as: MUCINEX Take 600 mg by mouth 2 (two) times daily.   guaiFENesin-dextromethorphan 100-10 MG/5ML syrup Commonly known as: ROBITUSSIN DM Take 10 mLs by mouth every 6 (six) hours as needed for cough.   insulin aspart 100 UNIT/ML injection Commonly known as: novoLOG Inject 0-15 Units into the skin every 4 (four) hours. Sliding scale  CBG 70 - 120: 0 units: CBG 121 - 150: 2 units; CBG 151 - 200: 3 units; CBG 201 - 250: 5 units; CBG 251 - 300: 8 units;CBG 301 - 350: 11 units; CBG 351 - 400: 15 units; CBG > 400 : 15 units and notify MD What changed:  when to take this additional instructions   insulin glargine-yfgn 100 UNIT/ML Pen Commonly known  as: Semglee (yfgn) Inject 24 Units into the skin 2 (two) times daily.   lamoTRIgine 25 MG tablet Commonly known as: LAMICTAL Take 1 tablet (25 mg total) by mouth 2 (two) times daily for 1 day, THEN 1 tablet (25 mg total) daily for 3 days, THEN 2 tablets (50 mg total) 2 (two) times daily. Start taking on: August 08, 2023 What changed: See the new instructions.   lamoTRIgine 25 MG tablet Commonly known as: LAMICTAL Take 2 tablets (50 mg total) by mouth at bedtime for 3 days. Start taking on: August 09, 2023 What changed: You were already taking a medication with the same name, and this prescription was added. Make sure you understand how and when to take each.   LORazepam 0.5 MG tablet Commonly known as: ATIVAN Take 1 tablet (0.5 mg total) by mouth daily as needed (panic attacks).   metFORMIN 1000 MG tablet Commonly known as: GLUCOPHAGE Take 1,000 mg by mouth in the morning and at bedtime.   nystatin powder Apply 1 Application topically daily. Apply to peri-wound bed, sacrum and right ischium daily with routine dressing.   ondansetron 4 MG disintegrating tablet Commonly known as: ZOFRAN-ODT Take 4 mg by mouth every 8 (eight) hours as needed for nausea or vomiting.   piperacillin-tazobactam IVPB Commonly known as: ZOSYN Inject 3.375 g into the vein every 6 (six) hours. Indication: diabetic foot infection/osteomyelitis  First Dose: Yes Last Day of Therapy: 08/15/2023 Labs - Once weekly:  CBC/D and BMP, Labs - Once weekly: ESR and CRP   polyethylene glycol 17 g packet Commonly known as: MIRALAX / GLYCOLAX Take 17 g by mouth daily as needed for mild constipation.   potassium chloride SA 20 MEQ tablet Commonly known as: KLOR-CON M Take 1 tablet (20 mEq total) by mouth daily. Take along with Lasix   QUEtiapine 25 MG tablet Commonly known as: SEROQUEL Take 1 tablet (25 mg total) by mouth at bedtime.   RITUXAN IV Inject 10 mg into the vein once. Between the 12th-13th of  June, December.   senna-docusate 8.6-50 MG tablet Commonly known as: Senokot-S Take 1 tablet by mouth 2 (two) times daily.   traZODone 100 MG tablet Commonly known as: DESYREL Take 100 mg by mouth at bedtime.   zinc sulfate (50mg  elemental zinc) 220 (50 Zn) MG capsule Take 1 capsule (220 mg total) by mouth daily.               Discharge Care Instructions  (From admission, onward)           Start     Ordered  08/08/23 0000  Discharge wound care:       Comments: Wound care  Daily      Comments: 1.         Clean plantar foot intact sutures with NS, apply Xeroform to incision site daily, cover with dry gauze or silicone foam.  2.Clean Sacral and Ischial wounds with Vashe wound cleanser [name] [phone]), apply Medihoney to wound beds daily, cover with dry gauze making sure to pack into any areas of depth. Apply Gerhardt's Butt Cream to surrounding skin of sacrum/coccyx/buttocks 3 times daily and prn soiling. May cover with ABD pad or silicone foams whichever is preferred  1   08/08/23 0934            Contact information for after-discharge care     Destination     [facility] Preferred SNF .   Service: Skilled Nursing Contact information: [address] [place] [address] [phone]                    Allergies  Allergen Reactions   Cipro [Ciprofloxacin Hcl] Other (See Comments)    Hallucinations Allergy not listed on MAR   Neurontin [Gabapentin] Other (See Comments)    Unknown reaction   Levaquin [Levofloxacin] Rash    Consultations: Critical care   Procedures/Studies: IR US CHEST  Result Date: 08/07/2023 CLINICAL DATA:  Patient presents to interventional Radiology for evaluation for left thoracentesis. EXAM: CHEST ULTRASOUND COMPARISON:  None Available. FINDINGS: Limited ultrasound examination of the left lung field with patient in the right lateral decubitus position revealed scant pleural  fluid. Fluid volume insufficient to safely perform thoracentesis. No procedure performed, images saved in epic. Image findings discussed with the patient. IMPRESSION: Scant left pleural fluid. No procedure performed. Ultrasound images obtained by Alwyn Ren NP Electronically Signed   By: Irish Lack M.D.   On: 08/07/2023 12:29   [place] EKG SITE RITE  Result Date: 08/07/2023 If Site Rite image not attached, placement could not be confirmed due to current cardiac rhythm.  DG CHEST PORT 1 VIEW  Result Date: 08/06/2023 CLINICAL DATA:  Effusion. EXAM: PORTABLE CHEST 1 VIEW COMPARISON:  One-view chest x-ray 07/31/2023 FINDINGS: The heart size is normal. A large left pleural effusion is again noted. Mild edema is present. The right lung is otherwise clear. Degenerative changes are present at the shoulders. IMPRESSION: 1. Large left pleural effusion. 2. Mild edema. Electronically Signed   By: Marin Roberts M.D.   On: 08/06/2023 08:20   DG CHEST PORT 1 VIEW  Result Date: 07/31/2023 CLINICAL DATA:  Ventilator dependent. EXAM: PORTABLE CHEST 1 VIEW COMPARISON:  Chest radiograph 07/30/2023 FINDINGS: An endotracheal tube remains in place and terminates 4 cm above the carina. A right PICC remains in place and projects over the lower SVC. An enteric tube courses into the abdomen with tip not imaged. The cardiomediastinal silhouette is grossly unchanged allowing for leftward patient rotation on today's examination. A small left pleural effusion and left basilar atelectasis or consolidation are unchanged. The right lung remains grossly clear. No pneumothorax is identified. IMPRESSION: Unchanged small left pleural effusion and left basilar atelectasis or consolidation. Electronically Signed   By: Sebastian Ache M.D.   On: 07/31/2023 12:41   CT ABDOMEN PELVIS W CONTRAST  Result Date: 07/31/2023 CLINICAL DATA:  Sepsis. History of neurogenic bladder, quadriplegia, transverse myelitis, and decubitus ulcer  with osteomyelitis. EXAM: CT ABDOMEN AND PELVIS WITH CONTRAST TECHNIQUE: Multidetector CT imaging of the abdomen  and pelvis was performed using the standard protocol following bolus administration of intravenous contrast. RADIATION DOSE REDUCTION: This exam was performed according to the departmental dose-optimization program which includes automated exposure control, adjustment of the mA and/or kV according to patient size and/or use of iterative reconstruction technique. CONTRAST:  75mL OMNIPAQUE IOHEXOL 350 MG/ML SOLN COMPARISON:  07/19/2023. FINDINGS: Lower chest: There is a moderate left pleural effusion and small right pleural effusion with consolidation in the lower lobes bilaterally. The distal tip of an internal jugular central venous catheter terminates at the cavoatrial junction. There is a small pericardial effusion. Three-vessel coronary artery calcifications are noted. Hepatobiliary: No focal liver abnormality is seen. Status post cholecystectomy. No biliary dilatation. Pancreas: Unremarkable. No pancreatic ductal dilatation or surrounding inflammatory changes. Spleen: Normal in size without focal abnormality. Adrenals/Urinary Tract: The adrenal glands are within normal limits. Stable cortical calcification is noted in the upper pole of the left kidney. The kidneys enhance symmetrically. A cyst is noted in the left kidney. A subcentimeter hypodensity is present in the right kidney which is too small to further characterize. No renal calculus or hydronephrosis. Suprapubic catheter is noted in the urinary bladder. Stomach/Bowel: An enteric tube terminates in the stomach. Appendix appears normal. No evidence of bowel wall thickening, distention, or inflammatory changes. No free air or pneumatosis. Scattered diverticula are seen along the colon without evidence of diverticulitis. A left lower quadrant colostomy is present. Vascular/Lymphatic: Aortic atherosclerosis. No enlarged abdominal or pelvic lymph  nodes. Reproductive: Prostate is unremarkable. Other: No abdominopelvic ascites. Musculoskeletal: Degenerative changes are present in the thoracolumbar spine. Sacral decubitus ulcer and right posterior ischial ulcer with a associated bony deformities appear unchanged. IMPRESSION: 1. Moderate left pleural effusion and small right pleural effusion with atelectasis or infiltrate. 2. Small pericardial effusion with multi-vessel coronary artery calcifications. 3. Diverticulosis without diverticulitis. 4. Left lower quadrant colostomy. 5. Small pericardial effusion with multi-vessel coronary artery calcifications. 6. Stable sacral and right ischial decubitus ulcers with underlying bony changes. 7. Aortic atherosclerosis. Electronically Signed   By: Thornell Sartorius M.D.   On: 07/31/2023 00:06   CT Head Wo Contrast  Result Date: 07/30/2023 CLINICAL DATA:  Mental status change, unknown cause. History of transverse myelitis. EXAM: CT HEAD WITHOUT CONTRAST TECHNIQUE: Contiguous axial images were obtained from the base of the skull through the vertex without intravenous contrast. RADIATION DOSE REDUCTION: This exam was performed according to the departmental dose-optimization program which includes automated exposure control, adjustment of the mA and/or kV according to patient size and/or use of iterative reconstruction technique. COMPARISON:  07/19/2023. FINDINGS: Brain: No acute intracranial hemorrhage or midline shift. No extra-axial fluid collection is seen. Diffuse atrophy is noted. Periventricular white matter hypodensities are present bilaterally. A stable arachnoid cyst is noted in the posterior fossa. No hydrocephalus. Vascular: No hyperdense vessel or unexpected calcification. Skull: Normal. Negative for fracture or focal lesion. Sinuses/Orbits: Mucosal thickening is present in the ethmoid air cells. No acute orbital abnormality. Other: Enteric and endotracheal tubes are present. IMPRESSION: 1. No acute  intracranial process. 2. Atrophy with chronic microvascular ischemic changes. Electronically Signed   By: Thornell Sartorius M.D.   On: 07/30/2023 23:53   DG Chest Portable 1 View  Result Date: 07/30/2023 CLINICAL DATA:  Questionable sepsis EXAM: PORTABLE CHEST 1 VIEW COMPARISON:  07/26/2023 FINDINGS: Right PICC tip in the low SVC. Endotracheal tube in the intrathoracic trachea 6.1 cm from the carina. Subdiaphragmatic enteric tube. Stable cardiomediastinal silhouette. Aortic atherosclerotic calcification. Layering left pleural effusion. Retrocardiac  atelectasis or pneumonia. No pneumothorax. IMPRESSION: Layering left pleural effusion. Retrocardiac atelectasis or pneumonia. Electronically Signed   By: Minerva Fester M.D.   On: 07/30/2023 23:40   [place] EKG SITE RITE  Result Date: 07/27/2023 If Site Rite image not attached, placement could not be confirmed due to current cardiac rhythm.  [place] EKG SITE RITE  Result Date: 07/27/2023 If Site Rite image not attached, placement could not be confirmed due to current cardiac rhythm.  DG CHEST PORT 1 VIEW  Result Date: 07/26/2023 CLINICAL DATA:  Pleural effusion EXAM: PORTABLE CHEST 1 VIEW COMPARISON:  Chest x-ray dated July 24, 2023 FINDINGS: Unchanged enlarged cardiac and mediastinal contours. Stable position of right arm PICC. Unchanged diffuse interstitial opacities. Small left pleural effusion. IMPRESSION: 1. Unchanged mild diffuse interstitial opacities, likely due to pulmonary edema. 2. Small left pleural effusion. Electronically Signed   By: Allegra Lai M.D.   On: 07/26/2023 13:25   DG CHEST PORT 1 VIEW  Result Date: 07/24/2023 CLINICAL DATA:  142230 Pleural effusion 242230 EXAM: PORTABLE CHEST 1 VIEW COMPARISON:  CXR 07/22/23 FINDINGS: Right arm PICC with the tip at the cavoatrial junction. Layering left-sided pleural effusion, slightly increased from prior exam. Cardiomegaly. There are prominent bilateral interstitial opacities, favored to  represent mild pulmonary edema. No radiographically apparent displaced rib fractures. Visualized upper abdomen is notable for surgical clips in the right upper quadrant. IMPRESSION: 1. Layering left-sided pleural effusion, slightly increased from prior exam. 2. Cardiomegaly with mild pulmonary edema. Electronically Signed   By: Lorenza Cambridge M.D.   On: 07/24/2023 12:18   DG CHEST PORT 1 VIEW  Result Date: 07/22/2023 CLINICAL DATA:  Pleural effusion. EXAM: PORTABLE CHEST 1 VIEW COMPARISON:  Radiographs 07/20/2023 and 07/19/2023.  CT 07/19/2023. FINDINGS: 0719 hours. Stable cardiomegaly and vascular congestion. Unchanged left pleural effusion and probable left greater than right basilar atelectasis. No significant residual right pleural effusion. No pneumothorax. The bones appear unchanged. Telemetry leads overlie the chest. IMPRESSION: No significant residual right pleural effusion. Unchanged left pleural effusion and probable left greater than right basilar atelectasis. Electronically Signed   By: Carey Bullocks M.D.   On: 07/22/2023 11:41   [place] EKG SITE RITE  Result Date: 07/21/2023 If Site Rite image not attached, placement could not be confirmed due to current cardiac rhythm.  DG CHEST PORT 1 VIEW  Result Date: 07/20/2023 CLINICAL DATA:  Shortness of breath. EXAM: PORTABLE CHEST 1 VIEW COMPARISON:  Chest x-ray 10/30/4 FINDINGS: The heart is enlarged. Left pleural effusion basilar airspace disease is again noted. Aeration of both lungs is improved with some residual mild pulmonary vascular congestion. Right-sided PICC line is stable. IMPRESSION: 1. Improved aeration of both lungs with some residual mild pulmonary vascular congestion. 2. Persistent left pleural effusion and basilar airspace disease. Electronically Signed   By: Marin Roberts M.D.   On: 07/20/2023 17:02   CT ABDOMEN PELVIS W CONTRAST  Result Date: 07/19/2023 CLINICAL DATA:  Abdominal pain, acute, nonlocalized. EXAM: CT  ABDOMEN AND PELVIS WITH CONTRAST TECHNIQUE: Multidetector CT imaging of the abdomen and pelvis was performed using the standard protocol following bolus administration of intravenous contrast. RADIATION DOSE REDUCTION: This exam was performed according to the departmental dose-optimization program which includes automated exposure control, adjustment of the mA and/or kV according to patient size and/or use of iterative reconstruction technique. CONTRAST:  75mL OMNIPAQUE IOHEXOL 350 MG/ML SOLN COMPARISON:  CT abdomen/pelvis dated 06/30/2023 FINDINGS: Lower chest: Please refer to the same-day CT chest for description of intrathoracic  findings. Hepatobiliary: No focal liver abnormality is seen. Status post cholecystectomy. No biliary dilatation. Pancreas: Unremarkable. No pancreatic ductal dilatation or surrounding inflammatory changes. Spleen: Normal in size without focal abnormality. Adrenals/Urinary Tract: Adrenal glands are unremarkable. Stable appearance of the kidneys including a densely calcified cyst at the superior pole of the left kidney, for which no follow-up imaging is recommended. No renal calculi or hydronephrosis. The bladder is decompressed with a suprapubic catheter in place. Stomach/Bowel: Stable postoperative changes of the stomach related to prior gastrocolic fistula repair at the level of the gastric body. No evidence of recurrent fistula. The appendix is normal. Stable left lower quadrant colostomy. No evidence of obstruction. Colonic diverticulosis without evidence of acute diverticulitis. No abdominopelvic ascites. No intraperitoneal free air. Vascular/Lymphatic: Aortic atherosclerosis. No enlarged abdominal or pelvic lymph nodes. Reproductive: Prostate is unremarkable. Other: Similar appearance of a right posterior ischial decubitus ulcer which extends to the level of the right ischial tuberosity with surrounding soft tissue thickening. Chronic appearing sclerotic changes of the underlying  right ischial tuberosity without definite areas of acute osteolysis identified. Similar cutaneous thickening overlying the sacrum and left ischial tuberosity. Postoperative changes along the ventral abdominal wall. Musculoskeletal: No acute osseous abnormality. Stable fusion of the L4-L5 vertebral bodies. IMPRESSION: 1. No acute localizing findings in the abdomen or pelvis. 2. Stable postoperative changes of the stomach related to prior gastrocolic fistula repair without evidence of recurrent fistula. 3. Similar appearance of a right posterior ischial decubitus ulcer with chronic sclerotic changes of the underlying right ischial tuberosity. No definite acute osteolysis is identified. 4. Stable left lower quadrant colostomy. 5. Stable suprapubic catheter. 6.  Aortic Atherosclerosis (ICD10-I70.0). 7. Additional unchanged ancillary findings as described above. Electronically Signed   By: Hart Robinsons M.D.   On: 07/19/2023 13:22   CT Angio Chest PE W and/or Wo Contrast  Result Date: 07/19/2023 CLINICAL DATA:  Pulmonary embolism (PE) suspected, high prob. Weakness. Bradycardia. EXAM: CT ANGIOGRAPHY CHEST WITH CONTRAST TECHNIQUE: Multidetector CT imaging of the chest was performed using the standard protocol during bolus administration of intravenous contrast. Multiplanar CT image reconstructions and MIPs were obtained to evaluate the vascular anatomy. RADIATION DOSE REDUCTION: This exam was performed according to the departmental dose-optimization program which includes automated exposure control, adjustment of the mA and/or kV according to patient size and/or use of iterative reconstruction technique. CONTRAST:  75mL OMNIPAQUE IOHEXOL 350 MG/ML SOLN COMPARISON:  CTA chest dated November 01, 2021. FINDINGS: Cardiovascular: Satisfactory opacification of the pulmonary arteries to the segmental level. No evidence of pulmonary embolism. Heart size is borderline enlarged. Multivessel coronary artery calcifications.  Atherosclerotic calcifications of the thoracic aorta and arch branch vessels. There is a small pericardial effusion. Right-sided PICC tip at the level of the superior cavoatrial junction. Mediastinum/Nodes: No enlarged mediastinal, hilar, or axillary lymph nodes. Thyroid gland, trachea, and esophagus demonstrate no significant findings. Lungs/Pleura: Moderate-sized left-greater-than-right pleural effusions with basilar atelectasis. Consolidative opacities in the posterior left lower lung base. No pneumothorax. Upper Abdomen: Please refer to the same-day CT abdomen/pelvis for description of intra-abdominal findings. Musculoskeletal: No acute osseous abnormality. No suspicious osseous lesion. Review of the MIP images confirms the above findings. IMPRESSION: 1. No evidence of acute pulmonary embolism. 2. Moderate-sized left-greater-than-right pleural effusions with basilar atelectasis. Consolidative opacities in the posterior left lower lung base may represent atelectasis, however, an underlying infiltrate or mass can not be excluded. 3. Small pericardial effusion. 4.  Aortic Atherosclerosis (ICD10-I70.0). Electronically Signed   By: Maryan Char.D.  On: 07/19/2023 12:57   CT HEAD WO CONTRAST  Result Date: 07/19/2023 CLINICAL DATA:  Provided history: Mental status change, unknown cause. Polytrauma, blunt. Additional history provided: Generalized weakness, bradycardia, neck pain, headache, nausea, recent sepsis. EXAM: CT HEAD WITHOUT CONTRAST CT CERVICAL SPINE WITHOUT CONTRAST TECHNIQUE: Multidetector CT imaging of the head and cervical spine was performed following the standard protocol without intravenous contrast. Multiplanar CT image reconstructions of the cervical spine were also generated. RADIATION DOSE REDUCTION: This exam was performed according to the departmental dose-optimization program which includes automated exposure control, adjustment of the mA and/or kV according to patient size and/or use  of iterative reconstruction technique. COMPARISON:  Head CT 03/07/2023.  Cervical spine MRI 05/25/2020. FINDINGS: CT HEAD FINDINGS Brain: Generalized cerebral atrophy. Prominence perivascular spaces within the inferior basal ganglia, bilaterally. Apparent prominence of the retrocerebellar CSF space at midline and to the right, unchanged from the prior head CT of 03/07/2023. This may reflect a mega cisterna magna or an arachnoid cyst. There is no acute intracranial hemorrhage. No demarcated cortical infarct. No evidence of an intracranial mass. No midline shift. Vascular: No hyperdense vessel.  Atherosclerotic calcifications. Skull: No calvarial fracture or aggressive osseous lesion. Sinuses/Orbits: No mass or acute finding within the imaged orbits. Trace mucosal thickening within the right maxillary and bilateral ethmoid sinuses at the imaged levels. Other: Trace fluid within the right mastoid air cells. CT CERVICAL SPINE FINDINGS Alignment: No significant spondylolisthesis. Skull base and vertebrae: The basion-dental and atlanto-dental intervals are maintained.No evidence of acute fracture to the cervical spine. Soft tissues and spinal canal: No prevertebral fluid or swelling. No visible canal hematoma. Disc levels: Cervical spondylosis with multilevel disc space narrowing, disc bulges/central disc protrusions, uncovertebral hypertrophy and facet arthrosis. Disc space narrowing is greatest at C6-C7 and C7-T1 (moderate to advanced at these levels). Posterior disc osteophyte complex at C5-C6. Mild ossification of the posterior longitudinal ligament at the C2 level. No appreciable high-grade spinal canal stenosis. Multilevel bony neural foraminal narrowing. Multilevel ventral osteophytes, most prominent at C5-C6 and C6-C7. Upper chest: Partially imaged bilateral pleural effusions. Bilaterally. IMPRESSION: CT head: 1. No evidence of an acute intracranial abnormality. 2. Cerebral atrophy. CT cervical spine: 1. No  evidence of an acute fracture to the cervical spine. 2. Cervical spondylosis and ossification of the posterior longitudinal ligament as described. 3. Partially imaged bilateral pleural effusions. Electronically Signed   By: Jackey Loge D.O.   On: 07/19/2023 12:38   CT CERVICAL SPINE WO CONTRAST  Result Date: 07/19/2023 CLINICAL DATA:  Provided history: Mental status change, unknown cause. Polytrauma, blunt. Additional history provided: Generalized weakness, bradycardia, neck pain, headache, nausea, recent sepsis. EXAM: CT HEAD WITHOUT CONTRAST CT CERVICAL SPINE WITHOUT CONTRAST TECHNIQUE: Multidetector CT imaging of the head and cervical spine was performed following the standard protocol without intravenous contrast. Multiplanar CT image reconstructions of the cervical spine were also generated. RADIATION DOSE REDUCTION: This exam was performed according to the departmental dose-optimization program which includes automated exposure control, adjustment of the mA and/or kV according to patient size and/or use of iterative reconstruction technique. COMPARISON:  Head CT 03/07/2023.  Cervical spine MRI 05/25/2020. FINDINGS: CT HEAD FINDINGS Brain: Generalized cerebral atrophy. Prominence perivascular spaces within the inferior basal ganglia, bilaterally. Apparent prominence of the retrocerebellar CSF space at midline and to the right, unchanged from the prior head CT of 03/07/2023. This may reflect a mega cisterna magna or an arachnoid cyst. There is no acute intracranial hemorrhage. No demarcated cortical infarct. No evidence of  an intracranial mass. No midline shift. Vascular: No hyperdense vessel.  Atherosclerotic calcifications. Skull: No calvarial fracture or aggressive osseous lesion. Sinuses/Orbits: No mass or acute finding within the imaged orbits. Trace mucosal thickening within the right maxillary and bilateral ethmoid sinuses at the imaged levels. Other: Trace fluid within the right mastoid air cells.  CT CERVICAL SPINE FINDINGS Alignment: No significant spondylolisthesis. Skull base and vertebrae: The basion-dental and atlanto-dental intervals are maintained.No evidence of acute fracture to the cervical spine. Soft tissues and spinal canal: No prevertebral fluid or swelling. No visible canal hematoma. Disc levels: Cervical spondylosis with multilevel disc space narrowing, disc bulges/central disc protrusions, uncovertebral hypertrophy and facet arthrosis. Disc space narrowing is greatest at C6-C7 and C7-T1 (moderate to advanced at these levels). Posterior disc osteophyte complex at C5-C6. Mild ossification of the posterior longitudinal ligament at the C2 level. No appreciable high-grade spinal canal stenosis. Multilevel bony neural foraminal narrowing. Multilevel ventral osteophytes, most prominent at C5-C6 and C6-C7. Upper chest: Partially imaged bilateral pleural effusions. Bilaterally. IMPRESSION: CT head: 1. No evidence of an acute intracranial abnormality. 2. Cerebral atrophy. CT cervical spine: 1. No evidence of an acute fracture to the cervical spine. 2. Cervical spondylosis and ossification of the posterior longitudinal ligament as described. 3. Partially imaged bilateral pleural effusions. Electronically Signed   By: Jackey Loge D.O.   On: 07/19/2023 12:38   DG Chest Port 1 View  Result Date: 07/19/2023 CLINICAL DATA:  Altered mental status EXAM: PORTABLE CHEST 1 VIEW COMPARISON:  07/15/2023 x-ray FINDINGS: Right-sided PICC in place with tip at the SVC right atrial junction. Overlapping cardiac leads. Stable cardiopericardial silhouette with some vascular congestion and interstitial changes. Small effusions. No pneumothorax. Degenerative changes. Calcified aorta. IMPRESSION: No significant interval change compared to previous. Stable appearance of the right-sided PICC Electronically Signed   By: Karen Kays M.D.   On: 07/19/2023 10:06   ECHOCARDIOGRAM COMPLETE  Result Date: 07/15/2023     ECHOCARDIOGRAM REPORT   Patient Name:   Brent [name]. Date of Exam: 07/15/2023 Medical Rec #:  [id number]           Height:       72.0 in Accession #:    [id number]          Weight:       223.3 lb Date of Birth:  1945-10-01          BSA:          2.233 m Patient Age:    52 years            BP:           133/93 mmHg Patient Gender: M                   HR:           53 bpm. Exam Location:  Inpatient Procedure: 2D Echo, Color Doppler and Cardiac Doppler Indications:    CHF  History:        Patient has prior history of Echocardiogram examinations, most                 recent 03/12/2020. Risk Factors:Dyslipidemia, Hypertension and                 Diabetes.  Sonographer:    Milbert Coulter Referring Phys: [id number] RONDELL A SMITH IMPRESSIONS  1. Left ventricular ejection fraction, by estimation, is 60 to 65%. The left ventricle has normal function. The left ventricle has no  regional wall motion abnormalities. There is mild left ventricular hypertrophy. Left ventricular diastolic parameters were normal.  2. Right ventricular systolic function is normal. The right ventricular size is normal. Tricuspid regurgitation signal is inadequate for assessing PA pressure.  3. A small pericardial effusion is present.  4. The mitral valve is normal in structure. Trivial mitral valve regurgitation. No evidence of mitral stenosis.  5. The aortic valve is tricuspid. Aortic valve regurgitation is not visualized. Aortic valve sclerosis is present, with no evidence of aortic valve stenosis.  6. The inferior vena cava is dilated in size with >50% respiratory variability, suggesting right atrial pressure of 8 mmHg. FINDINGS  Left Ventricle: Left ventricular ejection fraction, by estimation, is 60 to 65%. The left ventricle has normal function. The left ventricle has no regional wall motion abnormalities. The left ventricular internal cavity size was normal in size. There is  mild left ventricular hypertrophy. Left ventricular diastolic  parameters were normal. Right Ventricle: The right ventricular size is normal. No increase in right ventricular wall thickness. Right ventricular systolic function is normal. Tricuspid regurgitation signal is inadequate for assessing PA pressure. Left Atrium: Left atrial size was normal in size. Right Atrium: Right atrial size was normal in size. Pericardium: A small pericardial effusion is present. Presence of epicardial fat layer. Mitral Valve: The mitral valve is normal in structure. Trivial mitral valve regurgitation. No evidence of mitral valve stenosis. Tricuspid Valve: The tricuspid valve is normal in structure. Tricuspid valve regurgitation is trivial. Aortic Valve: The aortic valve is tricuspid. Aortic valve regurgitation is not visualized. Aortic valve sclerosis is present, with no evidence of aortic valve stenosis. Aortic valve mean gradient measures 4.0 mmHg. Aortic valve peak gradient measures 8.5  mmHg. Aortic valve area, by VTI measures 2.22 cm. Pulmonic Valve: The pulmonic valve was not well visualized. Pulmonic valve regurgitation is not visualized. Aorta: The aortic root and ascending aorta are structurally normal, with no evidence of dilitation. Venous: The inferior vena cava is dilated in size with greater than 50% respiratory variability, suggesting right atrial pressure of 8 mmHg. IAS/Shunts: The interatrial septum was not well visualized.  LEFT VENTRICLE PLAX 2D LVIDd:         5.10 cm   Diastology LVIDs:         3.20 cm   LV e' medial:    8.27 cm/s LV PW:         1.10 cm   LV E/e' medial:  7.2 LV IVS:        1.10 cm   LV e' lateral:   9.36 cm/s LVOT diam:     2.10 cm   LV E/e' lateral: 6.4 LV SV:         75 LV SV Index:   34 LVOT Area:     3.46 cm  RIGHT VENTRICLE RV Basal diam:  3.40 cm RV Mid diam:    2.40 cm RV S prime:     12.20 cm/s TAPSE (M-mode): 1.9 cm LEFT ATRIUM             Index        RIGHT ATRIUM           Index LA diam:        2.80 cm 1.25 cm/m   RA Area:     15.00 cm LA Vol  (A2C):   65.3 ml 29.25 ml/m  RA Volume:   35.00 ml  15.68 ml/m LA Vol (A4C):   64.7 ml 28.98 ml/m  LA Biplane Vol: 66.6 ml 29.83 ml/m  AORTIC VALVE AV Area (Vmax):    2.21 cm AV Area (Vmean):   2.20 cm AV Area (VTI):     2.22 cm AV Vmax:           146.00 cm/s AV Vmean:          96.300 cm/s AV VTI:            0.337 m AV Peak Grad:      8.5 mmHg AV Mean Grad:      4.0 mmHg LVOT Vmax:         93.30 cm/s LVOT Vmean:        61.300 cm/s LVOT VTI:          0.216 m LVOT/AV VTI ratio: 0.64  AORTA Ao Root diam: 3.40 cm Ao Asc diam:  3.20 cm MITRAL VALVE MV Area (PHT): 3.93 cm    SHUNTS MV Decel Time: 193 msec    Systemic VTI:  0.22 m MV E velocity: 59.60 cm/s  Systemic Diam: 2.10 cm MV A velocity: 69.30 cm/s MV E/A ratio:  0.86 Epifanio Lesches MD Electronically signed by Epifanio Lesches MD Signature Date/Time: 07/15/2023/2:04:49 PM    Final    DG CHEST PORT 1 VIEW  Result Date: 07/15/2023 CLINICAL DATA:  77 year old male with history of shortness of breath. EXAM: PORTABLE CHEST 1 VIEW COMPARISON:  Chest x-ray 07/14/2023. FINDINGS: There is a right upper extremity PICC with tip terminating in the superior cavoatrial junction. Lung volumes are low. Bibasilar opacities which may reflect areas of atelectasis and/or consolidation. Moderate bilateral pleural effusions. No pneumothorax. No evidence of pulmonary edema. Diffuse interstitial prominence and peribronchial cuffing. Heart size is mildly enlarged. The patient is rotated to the left on today's exam, resulting in distortion of the mediastinal contours and reduced diagnostic sensitivity and specificity for mediastinal pathology. Atherosclerotic calcifications are noted in the thoracic aorta. IMPRESSION: 1. Support apparatus, as above. 2. Persistent moderate bilateral pleural effusions with bibasilar areas of atelectasis and/or consolidation. 3. Cardiomegaly. 4. Aortic atherosclerosis. Electronically Signed   By: Trudie Reed M.D.   On: 07/15/2023  07:37   DG Chest Port 1 View  Result Date: 07/14/2023 CLINICAL DATA:  Cough and dyspnea EXAM: PORTABLE CHEST 1 VIEW COMPARISON:  07/04/2023 x-ray FINDINGS: Film is rotated to the left. Enlarged cardiopericardial silhouette with calcified aorta. There are some vascular congestion. Small pleural effusions with the adjacent opacities. No pneumothorax. Right-sided PICC with the tip along the central SVC. Overlapping cardiac leads. Degenerative changes of the spine. IMPRESSION: Enlarged cardiopericardial silhouette with vascular congestion. Increasing small pleural effusions. Adjacent opacities. Recommend follow-up. Right-sided PICC Electronically Signed   By: Karen Kays M.D.   On: 07/14/2023 13:05   (Echo, Carotid, EGD, Colonoscopy, ERCP)    Subjective: Patient seen and examined.  Denies any complaints.  He is alert awake and oriented.  No other overnight events.  Patient is agreeable to transfer to SNF today.  Received a PICC line. Updated wife on the phone 11/18 evening.   Discharge Exam: Vitals:   08/08/23 0809 08/08/23 0900  BP: (!) 173/76   Pulse: 75   Resp: 15 20  Temp: 97.9 F (36.6 C)   SpO2: 98%    Vitals:   08/07/23 2346 08/08/23 0406 08/08/23 0809 08/08/23 0900  BP: (!) 162/72 (!) 161/93 (!) 173/76   Pulse: 79 71 75   Resp: 18 15 15 20   Temp: (!) 97.5 F (36.4 C) 97.7 F (36.5  C) 97.9 F (36.6 C)   TempSrc:  Axillary Oral   SpO2: 97% 95% 98%   Weight:      Height:        General: Pt is alert, awake, not in acute distress.  Frail and debilitated.  Chronically sick looking but not in any distress. Cardiovascular: RRR, S1/S2 +, no rubs, no gallops Respiratory: CTA bilaterally, no wheezing, no rhonchi, equal air entry. Abdominal: Soft, NT, ND, bowel sounds +, patient has left lower quadrant colostomy with formed stool.  Suprapubic catheter with clear urine. Extremities: no edema, no cyanosis  Patient has touch sensation both lower extremities.  He has no motor  power. Right plantar wound with incisions closed, silk sutures present that are removed before discharge.  Surgical date was 10/13.  Looks clean and dry.    The results of significant diagnostics from this hospitalization (including imaging, microbiology, ancillary and laboratory) are listed below for reference.     Microbiology: Recent Results (from the past 240 hour(s))  Blood Culture (routine x 2)     Status: None   Collection Time: 07/30/23 10:22 PM   Specimen: BLOOD LEFT HAND  Result Value Ref Range Status   Specimen Description BLOOD LEFT HAND  Final   Special Requests   Final    BOTTLES DRAWN AEROBIC AND ANAEROBIC Blood Culture adequate volume   Culture   Final    NO GROWTH 5 DAYS Performed at [facility], [address], [place], [place] [address]    Report Status 08/04/2023 FINAL  Final  Resp panel by RT-PCR (RSV, Flu A&B, Covid) Anterior Nasal Swab     Status: None   Collection Time: 07/30/23 10:25 PM   Specimen: Anterior Nasal Swab  Result Value Ref Range Status   SARS Coronavirus 2 by RT PCR NEGATIVE NEGATIVE Final   Influenza A by PCR NEGATIVE NEGATIVE Final   Influenza B by PCR NEGATIVE NEGATIVE Final    Comment: (NOTE) The Xpert Xpress SARS-CoV-2/FLU/RSV plus assay is intended as an aid in the diagnosis of influenza from Nasopharyngeal swab specimens and should not be used as a sole basis for treatment. Nasal washings and aspirates are unacceptable for Xpert Xpress SARS-CoV-2/FLU/RSV testing.  Fact Sheet for Patients: BloggerCourse.com  Fact Sheet for Healthcare Providers: SeriousBroker.it  This test is not yet approved or cleared by the [place] FDA and has been authorized for detection and/or diagnosis of SARS-CoV-2 by FDA under an Emergency Use Authorization (EUA). This EUA will remain in effect (meaning this test can be used) for the duration of the COVID-19 declaration under Section  564(b)(1) of the Act, 21 U.S.C. section 360bbb-3(b)(1), unless the authorization is terminated or revoked.     Resp Syncytial Virus by PCR NEGATIVE NEGATIVE Final    Comment: (NOTE) Fact Sheet for Patients: BloggerCourse.com  Fact Sheet for Healthcare Providers: SeriousBroker.it  This test is not yet approved or cleared by the [place] FDA and has been authorized for detection and/or diagnosis of SARS-CoV-2 by FDA under an Emergency Use Authorization (EUA). This EUA will remain in effect (meaning this test can be used) for the duration of the COVID-19 declaration under Section 564(b)(1) of the Act, 21 U.S.C. section 360bbb-3(b)(1), unless the authorization is terminated or revoked.  Performed at [facility], [address] [place], [place] [address]   Blood Culture (routine x 2)     Status: None   Collection Time: 07/30/23 10:30 PM   Specimen: BLOOD  Result Value  Ref Range Status   Specimen Description BLOOD LEFT ANTECUBITAL  Final   Special Requests   Final    BOTTLES DRAWN AEROBIC AND ANAEROBIC Blood Culture adequate volume   Culture   Final    NO GROWTH 5 DAYS Performed at [facility], [address] [place], [place] [address]    Report Status 08/04/2023 FINAL  Final  Urine Culture (for pregnant, neutropenic or urologic patients or patients with an indwelling urinary catheter)     Status: None   Collection Time: 07/30/23 11:03 PM   Specimen: Urine, Suprapubic  Result Value Ref Range Status   Specimen Description URINE, SUPRAPUBIC  Final   Special Requests NONE  Final   Culture   Final    NO GROWTH Performed at [facility], [address], [place], [place] [address]    Report Status 08/01/2023 FINAL  Final  MRSA Next Gen by PCR, Nasal     Status: None   Collection Time: 07/31/23  1:00 AM   Specimen: Nasal Mucosa; Nasal Swab  Result Value Ref Range Status   MRSA by PCR Next Gen NOT  DETECTED NOT DETECTED Final    Comment: (NOTE) The GeneXpert MRSA Assay (FDA approved for NASAL specimens only), is one component of a comprehensive MRSA colonization surveillance program. It is not intended to diagnose MRSA infection nor to guide or monitor treatment for MRSA infections. Test performance is not FDA approved in patients less than 84 years old. Performed at [facility], [address] [place], [place] [address]      Labs: BNP (last 3 results) Recent Labs    07/14/23 1408 07/19/23 0920 07/21/23 0624  BNP 168.3* 136.4* 73.6   Basic Metabolic Panel: Recent Labs  Lab 08/02/23 0859 08/03/23 0440 08/04/23 0443 08/05/23 0428 08/06/23 1239 08/07/23 1158  NA 139 134* 134* 137 136 136  K 3.2* 3.2* 3.2* 4.3 3.6 4.2  CL 102 98 99 102 96* 94*  CO2 27 26 25 24 28 30   GLUCOSE 241* 274* 193* 206* 331* 282*  BUN 13 15 13 13 12 11   CREATININE 0.95 1.22 0.83 0.74 1.02 0.84  CALCIUM 9.0 8.8* 8.7* 8.5* 8.7* 9.1  MG 1.7 1.6* 1.8 1.6*  --  1.7  PHOS 3.3 3.0 2.0* 3.0  --  3.0   Liver Function Tests: Recent Labs  Lab 08/03/23 0440 08/04/23 0443 08/05/23 0428 08/06/23 1239 08/07/23 1158  AST 108* 68* 43* 44* 39  ALT 87* 80* 62* 55* 53*  ALKPHOS 114 108 106 129* 130*  BILITOT 0.4 0.5 0.2 0.5 0.5  PROT 5.8* 5.7* 5.5* 5.7* 6.4*  ALBUMIN 2.6* 2.6* 2.6* 2.4* 2.7*   No results for input(s): "LIPASE", "AMYLASE" in the last 168 hours. No results for input(s): "AMMONIA" in the last 168 hours. CBC: Recent Labs  Lab 08/03/23 0440 08/04/23 0443 08/05/23 0428 08/06/23 1239 08/07/23 0518  WBC 6.2 7.5 8.1 6.6 7.5  NEUTROABS 4.5 5.5 4.9 4.4 4.9  HGB 9.0* 9.4* 9.8* 10.3* 10.4*  HCT 30.0* 31.1* 33.2* 34.0* 34.1*  MCV 80.9 80.6 83.4 82.5 81.6  PLT 338 302 260 280 240   Cardiac Enzymes: No results for input(s): "CKTOTAL", "CKMB", "CKMBINDEX", "TROPONINI" in the last 168 hours. BNP: Invalid input(s): "POCBNP" CBG: Recent Labs  Lab 08/07/23 1203 08/07/23 1612  08/07/23 2102 08/08/23 0622 08/08/23 0811  GLUCAP 270* 273* 180* 208* 218*   D-Dimer No results for input(s): "DDIMER" in the last 72 hours. Hgb A1c  No results for input(s): "HGBA1C" in the last 72 hours. Lipid Profile No results for input(s): "CHOL", "HDL", "LDLCALC", "TRIG", "CHOLHDL", "LDLDIRECT" in the last 72 hours. Thyroid function studies No results for input(s): "TSH", "T4TOTAL", "T3FREE", "THYROIDAB" in the last 72 hours.  Invalid input(s): "FREET3" Anemia work up No results for input(s): "VITAMINB12", "FOLATE", "FERRITIN", "TIBC", "IRON", "RETICCTPCT" in the last 72 hours. Urinalysis    Component Value Date/Time   COLORURINE YELLOW 07/30/2023 2303   APPEARANCEUR HAZY (A) 07/30/2023 2303   LABSPEC 1.018 07/30/2023 2303   PHURINE 5.0 07/30/2023 2303   GLUCOSEU NEGATIVE 07/30/2023 2303   HGBUR MODERATE (A) 07/30/2023 2303   BILIRUBINUR NEGATIVE 07/30/2023 2303   BILIRUBINUR n 03/18/2015 0928   KETONESUR NEGATIVE 07/30/2023 2303   PROTEINUR 100 (A) 07/30/2023 2303   UROBILINOGEN 2.0 03/18/2015 0928   UROBILINOGEN 1.0 07/16/2012 1345   NITRITE NEGATIVE 07/30/2023 2303   LEUKOCYTESUR SMALL (A) 07/30/2023 2303   Sepsis Labs Recent Labs  Lab 08/04/23 0443 08/05/23 0428 08/06/23 1239 08/07/23 0518  WBC 7.5 8.1 6.6 7.5   Microbiology Recent Results (from the past 240 hour(s))  Blood Culture (routine x 2)     Status: None   Collection Time: 07/30/23 10:22 PM   Specimen: BLOOD LEFT HAND  Result Value Ref Range Status   Specimen Description BLOOD LEFT HAND  Final   Special Requests   Final    BOTTLES DRAWN AEROBIC AND ANAEROBIC Blood Culture adequate volume   Culture   Final    NO GROWTH 5 DAYS Performed at [facility], [address] [place], [place] [address]    Report Status 08/04/2023 FINAL  Final  Resp panel by RT-PCR (RSV, Flu A&B, Covid) Anterior Nasal Swab     Status: None   Collection Time: 07/30/23 10:25 PM   Specimen: Anterior Nasal Swab   Result Value Ref Range Status   SARS Coronavirus 2 by RT PCR NEGATIVE NEGATIVE Final   Influenza A by PCR NEGATIVE NEGATIVE Final   Influenza B by PCR NEGATIVE NEGATIVE Final    Comment: (NOTE) The Xpert Xpress SARS-CoV-2/FLU/RSV plus assay is intended as an aid in the diagnosis of influenza from Nasopharyngeal swab specimens and should not be used as a sole basis for treatment. Nasal washings and aspirates are unacceptable for Xpert Xpress SARS-CoV-2/FLU/RSV testing.  Fact Sheet for Patients: BloggerCourse.com  Fact Sheet for Healthcare Providers: SeriousBroker.it  This test is not yet approved or cleared by the [place] FDA and has been authorized for detection and/or diagnosis of SARS-CoV-2 by FDA under an Emergency Use Authorization (EUA). This EUA will remain in effect (meaning this test can be used) for the duration of the COVID-19 declaration under Section 564(b)(1) of the Act, 21 U.S.C. section 360bbb-3(b)(1), unless the authorization is terminated or revoked.     Resp Syncytial Virus by PCR NEGATIVE NEGATIVE Final    Comment: (NOTE) Fact Sheet for Patients: BloggerCourse.com  Fact Sheet for Healthcare Providers: SeriousBroker.it  This test is not yet approved or cleared by the [place] FDA and has been authorized for detection and/or diagnosis of SARS-CoV-2 by FDA under an Emergency Use Authorization (EUA). This EUA will remain in effect (meaning this test can be used) for the duration of the COVID-19 declaration under Section 564(b)(1) of the Act, 21 U.S.C. section 360bbb-3(b)(1), unless the authorization is terminated or revoked.  Performed at [facility], [address], [place], [place] [address]   Blood Culture (routine x 2)  Status: None   Collection Time: 07/30/23 10:30 PM   Specimen: BLOOD  Result Value Ref Range Status   Specimen  Description BLOOD LEFT ANTECUBITAL  Final   Special Requests   Final    BOTTLES DRAWN AEROBIC AND ANAEROBIC Blood Culture adequate volume   Culture   Final    NO GROWTH 5 DAYS Performed at [facility], [address] [place], [place] [address]    Report Status 08/04/2023 FINAL  Final  Urine Culture (for pregnant, neutropenic or urologic patients or patients with an indwelling urinary catheter)     Status: None   Collection Time: 07/30/23 11:03 PM   Specimen: Urine, Suprapubic  Result Value Ref Range Status   Specimen Description URINE, SUPRAPUBIC  Final   Special Requests NONE  Final   Culture   Final    NO GROWTH Performed at [facility], [address] [place], [place] [address]    Report Status 08/01/2023 FINAL  Final  MRSA Next Gen by PCR, Nasal     Status: None   Collection Time: 07/31/23  1:00 AM   Specimen: Nasal Mucosa; Nasal Swab  Result Value Ref Range Status   MRSA by PCR Next Gen NOT DETECTED NOT DETECTED Final    Comment: (NOTE) The GeneXpert MRSA Assay (FDA approved for NASAL specimens only), is one component of a comprehensive MRSA colonization surveillance program. It is not intended to diagnose MRSA infection nor to guide or monitor treatment for MRSA infections. Test performance is not FDA approved in patients less than 37 years old. Performed at [facility], [address], [place], [place] [address]      Time coordinating discharge: 40 minutes  SIGNED:   Dorcas Carrow, MD  [facility] 08/08/2023, 10:56 AM

## 2023-08-08 NOTE — Progress Notes (Signed)
Attempted to return  call of patient's wife [name], voicemail box was full, will try again later.

## 2023-08-08 NOTE — Care Management Important Message (Signed)
Important Message  Patient Details  Name: Brent [name]. MRN: [id number] Date of Birth: [birth year]   Important Message Given:  Yes - Medicare IM Pphone number and the room number will mail a copy to the patient home address.   Adam Demary 08/08/2023, 2:43 PM

## 2023-08-08 NOTE — TOC Transition Note (Signed)
Transition of Care [facility]) - CM/SW Discharge Note   Patient Details  Name: Brent [name]. MRN: [id number] Date of Birth: Feb 08, 1946  [facility]) CM/SW Contact:  Baldemar Lenis, [place] Phone Number: 08/08/2023, 1:49 PM   Clinical Narrative:   Patient stable to return to [facility] today. CSW sent discharge information to [facility], confirmed they were ready for patient to admit. Spouse, [name], updated via phone, she is in agreement. Transport arranged with [facility] for next available.  Nurse to call report to [phone] Room 401P.    Final next level of care: Skilled Nursing Facility Barriers to Discharge: Barriers Resolved   Patient Goals and CMS Choice      Discharge Placement                Patient chooses bed at: [facility] Patient to be transferred to facility by: [facility] Name of family member notified: [name] Patient and family notified of of transfer: 08/08/23  Discharge Plan and Services Additional resources added to the After Visit Summary for   In-house Referral: Clinical Social Work   Post Acute Care Choice: Skilled Nursing Facility                               Social Determinants of Health (SDOH) Interventions SDOH Screenings   Food Insecurity: No Food Insecurity (08/07/2023)  Housing: Low Risk  (08/07/2023)  Transportation Needs: No Transportation Needs (08/07/2023)  Utilities: Not At Risk (08/07/2023)  Depression (PHQ2-9): Low Risk  (12/09/2020)  Financial Resource Strain: Low Risk  (03/06/2020)  Physical Activity: Inactive (12/14/2018)  Social Connections: Unknown (12/14/2018)  Stress: No Stress Concern Present (12/14/2018)  Tobacco Use: Medium Risk (07/30/2023)     Readmission Risk Interventions    07/31/2023    9:17 AM 07/02/2023   12:31 PM  Readmission Risk Prevention Plan  Transportation Screening Complete Complete  Medication Review Oceanographer) Complete Complete  PCP or Specialist appointment within 3-5 days  of discharge Complete Not Complete  PCP/Specialist Appt Not Complete comments  SNF resident  HRI or Home Care Consult Complete Complete  SW Recovery Care/Counseling Consult Complete Complete  Palliative Care Screening Not Applicable Not Applicable  Skilled Nursing Facility Complete Complete

## 2023-08-09 ENCOUNTER — Telehealth: Payer: Self-pay | Admitting: Gastroenterology

## 2023-08-09 NOTE — Telephone Encounter (Signed)
Inbound call from [place] from [facility] requesting a urgent appointment for patient. States patient is having brown and grey discharge from rectum. States it has been going on for several weeks. Requesting a call back at [phone]. Please advise, thank you.

## 2023-08-09 NOTE — Telephone Encounter (Signed)
Left message for Shanda Bumps at [facility] to call back.

## 2023-08-10 NOTE — Telephone Encounter (Signed)
Pt has a colostomy but is having brownish/gray discharge from his rectum. Pt also has a sacral wound and they are having issues keeping the area clean and dry with this drainage. Pt scheduled to see Quentin Mulling PA 08/21/23 at 1:30pm. Shanda Bumps aware of appt.

## 2023-08-10 NOTE — Telephone Encounter (Signed)
Left voicemail for Shanda Bumps to call back.

## 2023-08-11 NOTE — Telephone Encounter (Signed)
I have reviewed some notes and imaging reports from the patient's recent hospitalizations  I received the faxed photographs, which are poor quality black-and-white reproductions.  Nevertheless, evaluation and management of this poor man's severe sacral decubitus ulcerations is not a matter for investigation and management by gastroenterology.  I understand they are saying that he has a drainage from his rectum.  Given his overall clinical scenario, I still contend that that is very worrisome for the possibility of sacral osteomyelitis with fistula into his rectum.  If that is true, that is neither a diagnosis that I can make nor manage, especially here in our office.  On no occasion during hospitalizations was the inpatient GI service consulted on him.  The supervising physician at [facility]. [facility] facility needs to either consult the patient's orthopedic surgeon who managed his right foot osteomyelitis for some advice on this, speaks to his infectious disease consultants, get him hospitalized, or all of the above.  If Mr. Okray is hospitalized for any reason, our inpatient GI consult service can be notified to see him if it is felt that any endoscopic evaluation of his rectum is warranted.  His overall medical complexity and debility preclude any such evaluation in the outpatient setting.  He therefore does not need the upcoming office appointment scheduled with our APP.  - H. Myrtie Neither, MD

## 2023-08-11 NOTE — Telephone Encounter (Signed)
Left message for Jessica to call back.  

## 2023-08-11 NOTE — Telephone Encounter (Signed)
Left message for Shanda Bumps to call back. Appt cancelled.

## 2023-08-11 NOTE — Telephone Encounter (Signed)
Inbound call from [place] from [facility] advising she will send over patient drainage picture to [phone] for providers attn.

## 2023-08-11 NOTE — Telephone Encounter (Signed)
Spoke with Shanda Bumps (appointment coordinator with [facility]) regarding patient. Advised her that when we last spoke in October (phone note 06/29/23) that Dr. Myrtie Neither had recommended not scheduling an appointment with GI, and to follow up with Dr. Luciana Axe in I&D. 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 Dwain Sarna, not the sacral wound. She is going to fax pictures for Dr. Myrtie Neither to review to fax [phone]. Pt has been admitted to the hospital on several occasions. Advised her I would be in touch with any recommendations once Dr. Myrtie Neither has reviewed.

## 2023-08-15 NOTE — Telephone Encounter (Signed)
Spoke with Shanda Bumps & she was able to access Dr. Irving Burton note, and provided the information to RN/provider for patient. She's aware appointment has been cancelled.

## 2023-08-15 NOTE — Telephone Encounter (Signed)
Left message for Brent [name] to call back.

## 2023-08-21 ENCOUNTER — Ambulatory Visit: Payer: Medicare Other | Admitting: Physician Assistant

## 2023-10-18 ENCOUNTER — Telehealth: Payer: Self-pay

## 2023-10-18 NOTE — Telephone Encounter (Signed)
Scheduling received call from [facility] requesting pt be seen for rectal mass. Offered appt with an APP for 10/26/23 at 1:30pm but pt will be coming by nonemergent transport via stretcher and they only schedule appt from 9-12:30pm. Pt scheduled to see Dr. Myrtie Neither 10/31/22 at 10:20am. [facility] aware of appt and are faxing over notes for appt.

## 2023-10-18 NOTE — Telephone Encounter (Signed)
Marland Kitchen

## 2023-10-26 ENCOUNTER — Ambulatory Visit: Payer: Medicare Other | Admitting: Gastroenterology

## 2023-10-31 NOTE — Progress Notes (Unsigned)
[facility] Consult Note:  History: Brent [name]. 11/01/2023  Referring provider: [name], MD  Reason for consult/chief complaint: rectal mass (Pt states he is doing ok , t states that he is not nauseas now but that he is nauseas a lot  ) Request by long-term care facility to evaluate "rectal mass"   Subjective  Prior history: [name] is previously known to me from screening colonoscopy in May 2018, 2 subcentimeter tubular adenomas removed, left-sided diverticulosis, no recall recommended due to age. Later developed transverse myelitis and resultant paraplegia, neurogenic bladder, suprapubic catheter, left-sided colostomy I last saw in March 2024 for persistent gastrocutaneous fistula after previous gastrostomy removal.  This had unfortunately failed attempts at [facility] clip closure by Dr. Meridee Score.  Last EGD/EUS January 2024 also removed a subcentimeter gastric subepithelial well-differentiated NET.  Extensive gastric mucosal intestinal metaplasia was also seen. He ultimately needed gastric anterior wedge resection for closure of his persistent fistula done by Dr. Corliss Skains September 2024  He has had ongoing issues with sacral decubitus ulcer and right foot osteomyelitis requiring partial amputation in October 2024 (discharge summary and MRI report reviewed) He has had subsequent hospitalizations for altered mental status/encephalopathy and pleural effusion  Contacted by his long-term care facility November 2024 requesting stat consult for reports of brownish/gray discharge from his rectum with concern that this was stool despite having had a prior diverting colostomy.  My concern was that he might have sacral osteomyelitis fistulizing to the rectum, and recommend he be evaluated by orthopedics, ID and give further consideration to pelvic imaging.  (Phone notes with my recommendations on file in EMR) (See 2022 MRI pelvis report) _________________________  It  is not entirely clear to me what the clinical question is from his providers at the long-term care facility today.  He is on my schedule for "rectal mass", but unfortunately have no clinical or other provider notes from his caregivers at [facility] health. [name] is describing several months of nausea and loss of appetite, I think this coincides with his multiple hospitalizations, chronic osteomyelitis, antibiotic use and general debility.  No reported vomiting, unclear if his weight has been stable.  He does not have sensation in the pelvis and anorectal area, so he does not describe any pain in that area.  He believes his caregivers are concerned about a discharge from the rectum that is yellow and apparently concerning for possible.  He says sometimes it is green and loose.  This is presumably the reason for today's visit. On prior visits he has come in his wheelchair, but fortunately today he is here with 2 EMTs who brought him on a stretcher and were able to help me get him turned in position for a rectal exam.   ROS:  Review of Systems  Constitutional:  Positive for fatigue. Negative for appetite change and unexpected weight change.  HENT:  Negative for mouth sores and voice change.   Eyes:  Negative for pain and redness.  Respiratory:  Negative for cough and shortness of breath.   Cardiovascular:  Negative for chest pain and palpitations.  Genitourinary:  Negative for dysuria and hematuria.  Musculoskeletal:  Negative for arthralgias and myalgias.  Skin:  Negative for pallor and rash.       Decubitus ulcers  Neurological:  Negative for weakness and headaches.       Paraplegic  Hematological:  Negative for adenopathy.     Past Medical History: Past Medical History:  Diagnosis Date  Acute osteomyelitis of metatarsal bone of right foot (HCC) 07/01/2023   Allergy    Aortic atherosclerosis (HCC) 07/01/2023   Benign neuroendocrine tumor of stomach 04/04/2022   CAD (coronary artery  disease)    a. angioplasty of his RCA in 1990. b. bare metal stent placed in the RCA in 2000 followed by rotational atherectomy shortly after for stent restenosis. c. last cath was in 2012 showing stable moderate diffuse CAD. (70% mid LAD, 80% diagonal, 70% Ramus, 40% mid to distal RCA stent restenosis). d. Low risk nuc in 2015.   Diabetes mellitus    Diverticulosis    Elevated CK    Erectile dysfunction    Hemorrhoids    HTN (hypertension)    Hyperlipidemia    Hypertriglyceridemia    Malignant melanoma of left side of neck (HCC) 10/25/2018   Myocardial infarction (HCC)    Obesity    OSA (obstructive sleep apnea)    Persistent disorder of initiating or maintaining sleep    Personal history of colonic polyps 02/05/2003   Renal lesion 06/20/2016   Septic arthritis of foot (HCC) 07/01/2023   Stage IV decubitus ulcer (HCC) 06/30/2023     Past Surgical History: Past Surgical History:  Procedure Laterality Date   AMPUTATION TOE Right 07/02/2023   Procedure: AMPUTATION TOE FIFTH RAY;  Surgeon: Netta Cedars, MD;  Location: [facility];  Service: Orthopedics;  Laterality: Right;   BIOPSY  03/26/2022   Procedure: BIOPSY;  Surgeon: Iva Boop, MD;  Location: [facility] ENDOSCOPY;  Service: Gastroenterology;;   CHOLECYSTECTOMY     CORONARY ANGIOPLASTY     CORONARY STENT PLACEMENT     stenting of the right coronary artery with followup rotational  atherectomy. (3 stents placed)   ENDOSCOPIC MUCOSAL RESECTION  10/13/2022   Procedure: ENDOSCOPIC MUCOSAL RESECTION;  Surgeon: Meridee Score Netty Starring., MD;  Location: [facility];  Service: Gastroenterology;;   ESOPHAGOGASTRODUODENOSCOPY (EGD) WITH PROPOFOL N/A 03/26/2022   Procedure: ESOPHAGOGASTRODUODENOSCOPY (EGD) WITH PROPOFOL;  Surgeon: Iva Boop, MD;  Location: [facility] ENDOSCOPY;  Service: Gastroenterology;  Laterality: N/A;   ESOPHAGOGASTRODUODENOSCOPY (EGD) WITH PROPOFOL N/A 07/11/2022   Procedure: ESOPHAGOGASTRODUODENOSCOPY (EGD) WITH PROPOFOL;   Surgeon: Meridee Score Netty Starring., MD;  Location: [facility];  Service: Gastroenterology;  Laterality: N/A;   ESOPHAGOGASTRODUODENOSCOPY (EGD) WITH PROPOFOL N/A 10/13/2022   Procedure: ESOPHAGOGASTRODUODENOSCOPY (EGD) WITH PROPOFOL;  Surgeon: Meridee Score Netty Starring., MD;  Location: [facility];  Service: Gastroenterology;  Laterality: N/A;   EUS N/A 10/13/2022   Procedure: UPPER ENDOSCOPIC ULTRASOUND (EUS) RADIAL;  Surgeon: Lemar Lofty., MD;  Location: [facility];  Service: Gastroenterology;  Laterality: N/A;   FINGER SURGERY     right   FOOT SURGERY     right   HEMOSTASIS CLIP PLACEMENT  07/11/2022   Procedure: HEMOSTASIS CLIP PLACEMENT;  Surgeon: Meridee Score Netty Starring., MD;  Location: [facility];  Service: Gastroenterology;;  ovesco    HEMOSTASIS CLIP PLACEMENT  10/13/2022   Procedure: HEMOSTASIS CLIP PLACEMENT;  Surgeon: Lemar Lofty., MD;  Location: [facility];  Service: Gastroenterology;;   HOT HEMOSTASIS N/A 07/11/2022   Procedure: HOT HEMOSTASIS (ARGON PLASMA COAGULATION/BICAP);  Surgeon: Lemar Lofty., MD;  Location: [facility];  Service: Gastroenterology;  Laterality: N/A;   HOT HEMOSTASIS N/A 10/13/2022   Procedure: HOT HEMOSTASIS (ARGON PLASMA COAGULATION/BICAP);  Surgeon: Lemar Lofty., MD;  Location: [facility];  Service: Gastroenterology;  Laterality: N/A;   INGUINAL HERNIA REPAIR     right   IR FLUORO PROCEDURE UNLISTED  02/21/2023   IR GASTROSTOMY  TUBE REMOVAL  02/10/2022   IR REPLACE G-TUBE SIMPLE WO FLUORO  03/14/2023   IR REPLC GASTRO/COLONIC TUBE PERCUT W/FLUORO  12/22/2022   IR REPLC GASTRO/COLONIC TUBE PERCUT W/FLUORO  01/26/2023   IR REPLC GASTRO/COLONIC TUBE PERCUT W/FLUORO  03/06/2023   IR REPLC GASTRO/COLONIC TUBE PERCUT W/FLUORO  04/27/2023   IRRIGATION AND DEBRIDEMENT ABSCESS N/A 06/04/2020   Procedure: IRRIGATION AND DEBRIDEMENT SACRAL WOUND;  Surgeon: Diamantina Monks, MD;  Location: [facility] OR;  Service: General;  Laterality:  N/A;   LAPAROSCOPY N/A 06/04/2020   Procedure: LAPAROSCOPY ASSISTED COLOSTOMY, LAPAROSCOPY GASTROSTOMY;  Surgeon: Diamantina Monks, MD;  Location: [facility] OR;  Service: General;  Laterality: N/A;   LAPAROSCOPY N/A 05/24/2023   Procedure: LAPAROSCOPY DIAGNOSTIC;  Surgeon: Emelia Loron, MD;  Location: [facility] OR;  Service: General;  Laterality: N/A;   LAPAROTOMY N/A 05/24/2023   Procedure: WEDGED RESECTION OF STOMACH AND GASTROCUTANEOUS FISTULA;  Surgeon: Emelia Loron, MD;  Location: [facility] OR;  Service: General;  Laterality: N/A;   LYSIS OF ADHESION N/A 06/04/2020   Procedure: LYSIS OF ADHESION;  Surgeon: Diamantina Monks, MD;  Location: [facility] OR;  Service: General;  Laterality: N/A;   LYSIS OF ADHESION N/A 05/24/2023   Procedure: LYSIS OF ADHESION;  Surgeon: Emelia Loron, MD;  Location: [facility] OR;  Service: General;  Laterality: N/A;   melanoma removal     neck   ORTHOPEDIC SURGERY     foot right   POLYPECTOMY  10/13/2022   Procedure: POLYPECTOMY;  Surgeon: Mansouraty, Netty Starring., MD;  Location: [facility];  Service: Gastroenterology;;   rotator cuff surg     Bil   STENT REMOVAL  10/13/2022   Procedure: CLIP REMOVAL;  Surgeon: Lemar Lofty., MD;  Location: [facility];  Service: Gastroenterology;;   SUBMUCOSAL LIFTING INJECTION  10/13/2022   Procedure: SUBMUCOSAL LIFTING INJECTION;  Surgeon: Lemar Lofty., MD;  Location: [facility];  Service: Gastroenterology;;   SUBMUCOSAL TATTOO INJECTION  10/13/2022   Procedure: SUBMUCOSAL TATTOO INJECTION;  Surgeon: Lemar Lofty., MD;  Location: [facility];  Service: Gastroenterology;;     Family History: Family History  Problem Relation Age of Onset   Lung cancer Mother    COPD Mother    Obesity Mother    Liver cancer Father    Lung cancer Father    Heart disease Father    Heart disease Sister    Colon cancer Neg Hx    Stomach cancer Neg Hx    Pancreatic cancer Neg Hx    Esophageal cancer Neg Hx    Inflammatory  bowel disease Neg Hx    Liver disease Neg Hx    Rectal cancer Neg Hx     Social History: Social History   Socioeconomic History   Marital status: Married    Spouse name: Not on file   Number of children: 1   Years of education: Not on file   Highest education level: Not on file  Occupational History   Occupation: post-master    Comment: [place]  Tobacco Use   Smoking status: Former    Current packs/day: 0.00    Average packs/day: 1.5 packs/day for 30.0 years (45.0 ttl pk-yrs)    Types: Cigarettes    Start date: 09/19/1950    Quit date: 09/19/1980    Years since quitting: 43.1   Smokeless tobacco: Never  Vaping Use   Vaping status: Never Used  Substance and Sexual Activity   Alcohol use: Not Currently    Alcohol/week: 7.0 -  14.0 standard drinks of alcohol    Types: 7 - 14 Shots of liquor per week   Drug use: No   Sexual activity: Not Currently    Partners: Female  Other Topics Concern   Not on file  Social History Narrative   12/14/2018: Lives with wife and 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 (son had passed away several years ago)   Recevies much of his care through [place]   Enjoys golfing   Social Drivers of Corporate investment banker Strain: Low Risk  (03/06/2020)   Overall Financial Resource Strain (CARDIA)    Difficulty of Paying Living Expenses: Not hard at all  Food Insecurity: No Food Insecurity (08/07/2023)   Hunger Vital Sign    Worried About Running Out of Food in the Last Year: Never true    Ran Out of Food in the Last Year: Never true  Transportation Needs: No Transportation Needs (08/07/2023)   PRAPARE - Administrator, Civil Service (Medical): No    Lack of Transportation (Non-Medical): No  Physical Activity: Inactive (12/14/2018)   Exercise Vital Sign    Days of Exercise per Week: 0 days    Minutes of Exercise per Session: 0 min  Stress: No Stress Concern Present (12/14/2018)   [employer] of Occupational Health - Occupational Stress  Questionnaire    Feeling of Stress : Not at all  Social Connections: Unknown (12/14/2018)   Social Connection and Isolation Panel [NHANES]    Frequency of Communication with Friends and Family: Once a week    Frequency of Social Gatherings with Friends and Family: Never    Attends Religious Services: Not on Marketing executive or Organizations: Not on file    Attends Banker Meetings: Not on file    Marital Status: Married    Allergies: Allergies  Allergen Reactions   Cipro [Ciprofloxacin Hcl] Other (See Comments)    Hallucinations Allergy not listed on MAR   Neurontin [Gabapentin] Other (See Comments)    Unknown reaction   Levaquin [Levofloxacin] Rash    Outpatient Meds: Current Outpatient Medications  Medication Sig Dispense Refill   acetaminophen (TYLENOL) 500 MG tablet Take 2 tablets (1,000 mg total) by mouth every 6 (six) hours as needed for mild pain (or Fever >/= 101).     atenolol (TENORMIN) 25 MG tablet Take 12.5 mg by mouth in the morning.     atorvastatin (LIPITOR) 40 MG tablet Take 1 tablet (40 mg total) by mouth at bedtime.     baclofen 5 MG TABS Take 1 tablet (5 mg total) by mouth 3 (three) times daily.     cholecalciferol (VITAMIN D3) 25 MCG (1000 UNIT) tablet Take 2,000 Units by mouth daily.     DULoxetine (CYMBALTA) 30 MG capsule Take 1 capsule (30 mg total) by mouth at bedtime. 30 capsule 0   famotidine (PEPCID) 20 MG tablet Take 20 mg by mouth 2 (two) times daily.     ferrous sulfate 325 (65 FE) MG tablet Take 1 tablet (325 mg total) by mouth every other day.     furosemide (LASIX) 40 MG tablet Take 1 tablet (40 mg total) by mouth daily.     guaiFENesin (MUCINEX) 600 MG 12 hr tablet Take 600 mg by mouth 2 (two) times daily.     guaiFENesin-dextromethorphan (ROBITUSSIN DM) 100-10 MG/5ML syrup Take 10 mLs by mouth every 6 (six) hours as needed for cough.     insulin aspart (NOVOLOG) 100  UNIT/ML injection Inject 0-15 Units into the skin  every 4 (four) hours. Sliding scale  CBG 70 - 120: 0 units: CBG 121 - 150: 2 units; CBG 151 - 200: 3 units; CBG 201 - 250: 5 units; CBG 251 - 300: 8 units;CBG 301 - 350: 11 units; CBG 351 - 400: 15 units; CBG > 400 : 15 units and notify MD (Patient taking differently: Inject 0-15 Units into the skin See admin instructions. Inject 0-15 units before meals per sliding scale: 70-120 : 0 units 121-150 : 2 units 151-200 : 3 units 201-250 : 5 units 251-300 : 8 units 301-350 : 11 units 351-400 : 15 units > 400 : 15 units, notify MD) 10 mL 11   insulin glargine-yfgn (SEMGLEE, YFGN,) 100 UNIT/ML Pen Inject 24 Units into the skin 2 (two) times daily.     LORazepam (ATIVAN) 0.5 MG tablet Take 1 tablet (0.5 mg total) by mouth daily as needed (panic attacks). 5 tablet 0   Menthol, Topical Analgesic, (BIOFREEZE) 4 % GEL Apply 1 application  topically 2 (two) times daily as needed (pain to back (upper and lower),arms and shoulders).     metFORMIN (GLUCOPHAGE) 1000 MG tablet Take 1,000 mg by mouth in the morning and at bedtime.     nystatin powder Apply 1 Application topically daily. Apply to peri-wound bed, sacrum and right ischium daily with routine dressing.     ondansetron (ZOFRAN-ODT) 4 MG disintegrating tablet Take 4 mg by mouth every 8 (eight) hours as needed for nausea or vomiting.     Polyethyl Glycol-Propyl Glycol (GENTEAL TEARS SEVERE DAY/NIGHT) 0.4-0.3 % GEL ophthalmic gel Place 1 Application into both eyes 4 (four) times daily as needed (dry eyes).     polyethylene glycol (MIRALAX / GLYCOLAX) 17 g packet Take 17 g by mouth daily as needed for mild constipation.     potassium chloride SA (KLOR-CON M) 20 MEQ tablet Take 1 tablet (20 mEq total) by mouth daily. Take along with Lasix     riTUXimab (RITUXAN IV) Inject 10 mg into the vein once. Between the 12th-13th of June, December.     senna-docusate (SENOKOT-S) 8.6-50 MG tablet Take 1 tablet by mouth 2 (two) times daily.     traZODone (DESYREL) 100 MG  tablet Take 100 mg by mouth at bedtime.     vitamin C (ASCORBIC ACID) 500 MG tablet Take 500 mg by mouth daily.     zinc sulfate 220 (50 Zn) MG capsule Take 1 capsule (220 mg total) by mouth daily. 30 capsule 0   lamoTRIgine (LAMICTAL) 25 MG tablet Take 1 tablet (25 mg total) by mouth 2 (two) times daily for 1 day, THEN 1 tablet (25 mg total) daily for 3 days, THEN 2 tablets (50 mg total) 2 (two) times daily. 125 tablet 0   lamoTRIgine (LAMICTAL) 25 MG tablet Take 2 tablets (50 mg total) by mouth at bedtime for 3 days. 6 tablet 0   QUEtiapine (SEROQUEL) 25 MG tablet Take 1 tablet (25 mg total) by mouth at bedtime. 30 tablet 0   No current facility-administered medications for this visit.      ___________________________________________________________________ Objective   Exam:  BP 112/72   Pulse 97   Ht 6\' 1"  (1.854 m)   BMI 26.03 kg/m  Wt Readings from Last 3 Encounters:  08/07/23 197 lb 5 oz (89.5 kg)  07/25/23 199 lb 13.6 oz (90.6 kg)  07/17/23 213 lb 13.5 oz (97 kg)    General: Alert and  conversational, poor muscle mass, debilitated elderly man Eyes: sclera anicteric, no redness ENT: oral mucosa moist without lesions, no cervical or supraclavicular lymphadenopathy CV: Regular, heart sounds somewhat distant, Resp: clear to auscultation bilaterally, normal RR and effort noted GI: soft, suprapubic catheter, left-sided colostomy, no tenderness, with active bowel sounds. No guarding or palpable organomegaly noted. Extraocular muscles intact, symmetric upper extremity strength, speech fluent  When turned over the left side, he is found to have multiple deep sacral and buttock decubitus ulcers covered by wound dressings.  A couple of them have peeled away on the lower aspects so these ulcers are visible, and there full packing material.  I had to pull back 1 of these dressing so I could find his anal canal to perform a DRE.  He has decreased resting and no voluntary sphincter  tone. He is impacted with firm stool Labs:     Latest Ref Rng & Units 08/07/2023    5:18 AM 08/06/2023   12:39 PM 08/05/2023    4:28 AM  CBC  WBC 4.0 - 10.5 K/uL 7.5  6.6  8.1   Hemoglobin 13.0 - 17.0 g/dL 57.8  46.9  9.8   Hematocrit 39.0 - 52.0 % 34.1  34.0  33.2   Platelets 150 - 400 K/uL 240  280  260       Latest Ref Rng & Units 08/07/2023   11:58 AM 08/06/2023   12:39 PM 08/05/2023    4:28 AM  CMP  Glucose 70 - 99 mg/dL 629  528  413   BUN 8 - 23 mg/dL 11  12  13    Creatinine 0.61 - 1.24 mg/dL 2.44  0.10  2.72   Sodium 135 - 145 mmol/L 136  136  137   Potassium 3.5 - 5.1 mmol/L 4.2  3.6  4.3   Chloride 98 - 111 mmol/L 94  96  102   CO2 22 - 32 mmol/L 30  28  24    Calcium 8.9 - 10.3 mg/dL 9.1  8.7  8.5   Total Protein 6.5 - 8.1 g/dL 6.4  5.7  5.5   Total Bilirubin <1.2 mg/dL 0.5  0.5  0.2   Alkaline Phos 38 - 126 U/L 130  129  106   AST 15 - 41 U/L 39  44  43   ALT 0 - 44 U/L 53  55  62      Radiologic Studies:  CLINICAL DATA:  Paraplegia and chronic osteomyelitis.   EXAM: MRI PELVIS WITHOUT AND WITH CONTRAST   TECHNIQUE: Multiplanar multisequence MR imaging of the pelvis was performed both before and after administration of intravenous contrast.   CONTRAST:  10mL GADAVIST GADOBUTROL 1 MMOL/ML IV SOLN   COMPARISON:  CT pelvis 11/16/2020   FINDINGS: Urinary Tract: Foley catheter in the urinary bladder. Mild wall thickening and accentuated enhancement in the urinary bladder suggesting cystitis. Subtle accentuation of enhancement in the distal ureters, particularly the right, possibly reflecting mild ureteritis.   Bowel:  Sigmoid colon diverticulosis.   Vascular/Lymphatic: Small likely reactive common iliac and external iliac lymph nodes.   Reproductive:  Unremarkable   Other:  Trace free pelvic fluid, origin uncertain.   Musculoskeletal: Mildly accentuated T2 signal and enhancement with thinning of the bony structures in the lower sacrum at  about the S4 and S5 level, and potentially of the adjacent coccyx. There is abnormal enhancement and thinning of overlying subcutaneous tissues as shown for example on image 29 of series 10 along with  some mild progress presacral enhancement and edema likely from low-grade chronic osteomyelitis. There is a suggestion of trace right trochanteric bursitis potentially with complex elements given the fairly low T2 signal in this vicinity.   Decubitus ulcer along the right lower buttock region extends towards the right ischial tuberosity and may extend to the periosteal margin, but there is no abnormal bony enhancement to suggest current right ischial osteomyelitis.   Subcutaneous edema lateral to the hips and upper thigh region, right greater than left.   IMPRESSION: 1. Decubitus ulcer along the right lower buttock region extends towards the right ischial tuberosity and may extend to the periosteal margin, but there is no abnormal bony enhancement to suggest current right ischial osteomyelitis. 2. Accentuated T2 signal and enhancement in the lower sacrum at about the S4 and S5 level, and potentially of the adjacent coccyx. This is compatible with low-grade chronic osteomyelitis. Thinning and edema of the overlying cutaneous and subcutaneous structures. 3. Mild wall thickening and accentuated enhancement in the urinary bladder suggesting cystitis. 4. Subtle accentuation of enhancement in the distal ureters, particularly the right, possibly reflecting mild ureteritis. 5. Trace free pelvic fluid, origin uncertain. 6. Sigmoid colon diverticulosis.     Electronically Signed   By: Gaylyn Rong M.D.   On: 11/17/2020 16:44 __________________________  CTAP Nov 2024 during hospitalization:  CT ABDOMEN AND PELVIS WITH CONTRAST   TECHNIQUE: Multidetector CT imaging of the abdomen and pelvis was performed using the standard protocol following bolus administration of intravenous  contrast.   RADIATION DOSE REDUCTION: This exam was performed according to the departmental dose-optimization program which includes automated exposure control, adjustment of the mA and/or kV according to patient size and/or use of iterative reconstruction technique.   CONTRAST:  75mL OMNIPAQUE IOHEXOL 350 MG/ML SOLN   COMPARISON:  07/19/2023.   FINDINGS: Lower chest: There is a moderate left pleural effusion and small right pleural effusion with consolidation in the lower lobes bilaterally. The distal tip of an internal jugular central venous catheter terminates at the cavoatrial junction. There is a small pericardial effusion. Three-vessel coronary artery calcifications are noted.   Hepatobiliary: No focal liver abnormality is seen. Status post cholecystectomy. No biliary dilatation.   Pancreas: Unremarkable. No pancreatic ductal dilatation or surrounding inflammatory changes.   Spleen: Normal in size without focal abnormality.   Adrenals/Urinary Tract: The adrenal glands are within normal limits. Stable cortical calcification is noted in the upper pole of the left kidney. The kidneys enhance symmetrically. A cyst is noted in the left kidney. A subcentimeter hypodensity is present in the right kidney which is too small to further characterize. No renal calculus or hydronephrosis. Suprapubic catheter is noted in the urinary bladder.   Stomach/Bowel: An enteric tube terminates in the stomach. Appendix appears normal. No evidence of bowel wall thickening, distention, or inflammatory changes. No free air or pneumatosis. Scattered diverticula are seen along the colon without evidence of diverticulitis. A left lower quadrant colostomy is present.   Vascular/Lymphatic: Aortic atherosclerosis. No enlarged abdominal or pelvic lymph nodes.   Reproductive: Prostate is unremarkable.   Other: No abdominopelvic ascites.   Musculoskeletal: Degenerative changes are present in  the thoracolumbar spine. Sacral decubitus ulcer and right posterior ischial ulcer with a associated bony deformities appear unchanged.   IMPRESSION: 1. Moderate left pleural effusion and small right pleural effusion with atelectasis or infiltrate. 2. Small pericardial effusion with multi-vessel coronary artery calcifications. 3. Diverticulosis without diverticulitis. 4. Left lower quadrant colostomy. 5. Small pericardial effusion with  multi-vessel coronary artery calcifications. 6. Stable sacral and right ischial decubitus ulcers with underlying bony changes. 7. Aortic atherosclerosis.     Electronically Signed   By: Thornell Sartorius M.D.   On: 07/31/2023 00:06   Images personally reviewed - H. Danis.   Stool in the distal sigmoid and rectum - this is a loop sigmoid colostomy _________________________  Date: 06/04/2020   Procedure: laparoscopic loop sigmoid colostomy,  laparoscopic gastrostomy tube placement, lysis of adhesions (15 minutes), debridement of sacral decubitus ulcer (14cm x 14cm x 3cm)   Pre-op diagnosis: stage 4 sacral decubitus ulcer, dysphagia and failure to thrive Post-op diagnosis: same, sacral osteomyelitis   Indication and clinical history: The patient is a 78 y.o. year old male with stage 4 sacral decubitus ulcer, dysphagia, and failure to thrive    Surgeon: Diamantina Monks, MD Assistant: Janee Morn, MD   Anesthesiologist: Noreene Larsson, MD Anesthesia: General   Findings:  Specimen: aerobic and anaerobic cultures EBL: 25cc Drains/Implants: gastrostomy tube placement, bridge for colostomy   Disposition: PACU - hemodynamically stable.   Description of procedure: The patient was positioned supine on the operating room table. General anesthetic induction and intubation were uneventful. The abdomen was prepped and draped in the usual sterile fashion. Time-out was performed verifying correct patient, procedure, signature of informed consent, and administration of  pre-operative antibiotics.    A Veress needle was used at Palmer's point to insufflate the abdomen and an optiview technique used to enter the peritoneal cavity. A second 5mm port was placed in the right abdomen under direct visualization. After identifying an appropriate seating site, a loop of sigmoid colon was brought up to the abdominal wall. The ostomy site was created and the loop brought through the fascia. Next the abdomen was re-insufflated and the stomach identified and brought up to the abdominal wall. An additional 5mm port was placed under direct visualization to aid in lysis of adhesions for approximately 15 minutes. The incision at this port site was extended and the stomach brought through the abdominal wall. The stomach was sutured to the fascia at three points using 2-0 vicryl suture. A pursestring was placed and a gastrotomy created, through which the gastrostomy tube was placed. The pursestring was cinched down and the balloon inflated. The incision was closed with two 2-0 vicryl sutures in an interrupted fashion and the bumper secured. Three 3-0 nylon sutures were used to secure the bumper at three points. Next, a red rubber catheter was placed in the mesentery of the loop of colon as a bridge and the colostomy was matured. The bridge was sutured in place and an ostomy appliance placed. The port sites were closed with 4-0 monocryl suture and dressed with dermabond.    The patient was then repositioned to prone and the sacrum prepped and draped. There was obvious purulent drainage and aerobic and anaerobic cultures were sampled as specimens. The wound was debrided using a combination of sharp dissection, curettage, and electrocautery and the wound extended down into the bone. Next a pulse lavage was used to irrigate the wound. The wound was then packed wet to dry with two kerlix covered by an ABD pad as sterile dressing.    All sponge and instrument counts were correct at the conclusion of  the procedure. The patient was awakened from anesthesia, extubated uneventfully, and transported to the PACU in good condition. There were no complications.      Diamantina Monks, MD General and Trauma Surgery [facility] Surgery  Encounter Diagnoses  Name Primary?   Fecal impaction (HCC) Yes   Nausea in adult    His nausea appears due to his multiple chronic illnesses, generalized debility, perhaps due to known chronic osteomyelitis and frequent need for antibiotics for this. As needed antiemetic is reasonable for him, being sure that what ever is used does not cause drug interactions with his current polypharmacy.   Regarding the concern for "rectal mass", if that was the reason for today's visit.  [name] has a fecal impaction, and we do not have the facilities or capabilities to attend to that here in our office.  He is probably having some overflow incontinence that understandably would cause it difficult to keep the perianal/sacral/buttock area clean and thus complicate his wound care.  I did extensive chart review and documentation as noted above, and [name] has a loop sigmoid colostomy, which means that there is still continuity from his descending colon down to the distal sigmoid and the rectum, and on most recent CT imaging there is a large amount of stool in the rectosigmoid area.  Caregivers at his facility need to attend to this fecal impaction with probable manual disimpaction as well as use of treatment such as tapwater enema, glycerin suppository or what they see fit to do.  This should be done under the direction of his facility's supervising physician orders.  Even once that is attended to, this could occur again given his condition and the colostomy that he has.  Thus, consideration should be given to converting his loop sigmoid colostomy to a permanent end colostomy if that is felt to be technically feasible. To that end, I will forward my note to Dr. Bedelia Person at [facility]  who did his original gastrostomy and colostomy in 2021.  His providers at [facility] I can certainly communicate directly with Dr. Bedelia Person for further advice on that.  Lastly, as I had noted in prior phone notes when contacted by his facility, the severity and duration of his sacral osteomyelitis could conceivably cause fistulizing soft tissue process into the rectum,.  If there is ongoing clinical concern for that based on the observations of his providers, they should obtain a pelvic MRI.  Thank you for the courtesy of this consult.  Please call me with any questions or concerns.   50 minutes were spent on this encounter (including chart review, history/exam, counseling/coordination of care, and documentation) > 50% of that time was spent on counseling and coordination of care.   Charlie Pitter III  CC: Referring provider noted above

## 2023-11-01 ENCOUNTER — Encounter: Payer: Self-pay | Admitting: Gastroenterology

## 2023-11-01 ENCOUNTER — Ambulatory Visit: Payer: Medicare Other | Admitting: Gastroenterology

## 2023-11-01 VITALS — BP 112/72 | HR 97 | Ht 73.0 in

## 2023-11-01 DIAGNOSIS — R110 Nausea: Secondary | ICD-10-CM | POA: Diagnosis not present

## 2023-11-01 DIAGNOSIS — K5641 Fecal impaction: Secondary | ICD-10-CM | POA: Diagnosis not present

## 2023-11-17 ENCOUNTER — Encounter (HOSPITAL_COMMUNITY): Payer: Self-pay | Admitting: Internal Medicine

## 2023-11-24 ENCOUNTER — Other Ambulatory Visit (HOSPITAL_COMMUNITY): Payer: Self-pay | Admitting: Internal Medicine

## 2023-11-24 DIAGNOSIS — M4628 Osteomyelitis of vertebra, sacral and sacrococcygeal region: Secondary | ICD-10-CM

## 2023-11-24 DIAGNOSIS — M86659 Other chronic osteomyelitis, unspecified thigh: Secondary | ICD-10-CM

## 2023-11-24 DIAGNOSIS — M8618 Other acute osteomyelitis, other site: Secondary | ICD-10-CM

## 2023-12-01 ENCOUNTER — Ambulatory Visit (HOSPITAL_COMMUNITY)
Admission: RE | Admit: 2023-12-01 | Discharge: 2023-12-01 | Disposition: A | Discharge status: Home / Self Care | Source: Ambulatory Visit | Attending: Internal Medicine | Admitting: Internal Medicine

## 2023-12-01 DIAGNOSIS — M86659 Other chronic osteomyelitis, unspecified thigh: Secondary | ICD-10-CM

## 2023-12-01 DIAGNOSIS — M4628 Osteomyelitis of vertebra, sacral and sacrococcygeal region: Secondary | ICD-10-CM

## 2023-12-01 DIAGNOSIS — M8618 Other acute osteomyelitis, other site: Secondary | ICD-10-CM

## 2023-12-01 MED ORDER — GADOBUTROL 1 MMOL/ML IV SOLN
9.0000 mL | Freq: Once | INTRAVENOUS | Status: AC | PRN
  Administered 2023-12-01: 9 mL via INTRAVENOUS

## 2023-12-21 ENCOUNTER — Encounter: Payer: Self-pay | Admitting: Internal Medicine

## 2023-12-21 ENCOUNTER — Ambulatory Visit: Admitting: Internal Medicine

## 2023-12-21 ENCOUNTER — Ambulatory Visit (INDEPENDENT_AMBULATORY_CARE_PROVIDER_SITE_OTHER): Admitting: Internal Medicine

## 2023-12-21 ENCOUNTER — Other Ambulatory Visit: Payer: Self-pay

## 2023-12-21 VITALS — BP 127/75 | HR 85 | Resp 16 | Ht 73.0 in | Wt 197.3 lb

## 2023-12-21 DIAGNOSIS — L89154 Pressure ulcer of sacral region, stage 4: Secondary | ICD-10-CM

## 2023-12-21 NOTE — Progress Notes (Signed)
 [facility]  Reason for Consult:sacral ulcer/om Referring Provider: blake    Patient Active Problem List   Diagnosis Date Noted   Acute encephalopathy 07/31/2023   Acute respiratory failure (HCC) 07/31/2023   Bilateral pleural effusion 07/26/2023   Acute confusional state 07/26/2023   Pleural effusion 07/19/2023   Leukocytosis 07/19/2023   Sinus bradycardia 07/19/2023   (HFpEF) heart failure with preserved ejection fraction (HCC) 07/14/2023   History of osteomyelitis 07/14/2023   GERD (gastroesophageal reflux disease) 07/14/2023   Diabetic foot ulcer (HCC) 07/01/2023   Acute osteomyelitis of metatarsal bone of right foot (HCC) 07/01/2023   Septic arthritis of foot (HCC) 07/01/2023   Colostomy present (HCC) 07/01/2023   DM type 2 (diabetes mellitus, type 2) (HCC) 07/01/2023   Aortic atherosclerosis (HCC) 07/01/2023   Pressure ulcer 07/01/2023   Stage IV decubitus ulcer (HCC) 06/30/2023   Urine leukocytes 06/30/2023   SIRS (systemic inflammatory response syndrome) (HCC) 06/05/2023   Suprapubic catheter (HCC) 06/05/2023   Dehydration 02/21/2023   Sepsis (HCC) 02/21/2023   Acute metabolic encephalopathy 02/21/2023   Lactic acidosis 02/21/2023   Insulin dependent type 2 diabetes mellitus (HCC) 02/21/2023   Normocytic anemia 02/21/2023   Primary malignant neuroendocrine tumor of stomach (HCC) 05/27/2022   Preoperative examination 05/27/2022   Abnormal endoscopy of upper gastrointestinal tract 05/27/2022   Benign neuroendocrine tumor of stomach  04/04/2022   Mucosal abnormality of stomach    Chronic osteomyelitis of pelvic region, right (HCC) 03/25/2022   External gastrointestinal fistula site infection 03/25/2022   Complicated urinary tract infection    Catheter-associated urinary tract infection (HCC) 08/06/2021   Osteomyelitis of pelvic region, acute, right (HCC) 03/24/2021   Right ischial pressure sore 02/10/2021   Stage IV pressure  ulcer of sacral region (HCC) 12/22/2020   Hypokalemia    Encounter for monitoring immunomodulating therapy 08/20/2020   High risk medication use 08/20/2020   Malnutrition of moderate degree 06/04/2020   Palliative care by specialist    Goals of care, counseling/discussion    FTT (failure to thrive) in adult 06/01/2020   Abdominal pain    Sacral ulcer, with necrosis of muscle (HCC) 04/09/2020   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HCC)    Transverse myelitis (HCC)    Labile blood pressure    Labile blood glucose    Neurogenic bladder    Neurogenic bowel    Transaminitis    Controlled type 2 diabetes mellitus with hyperglycemia, with long-term current use of insulin (HCC)    Quadriplegia (HCC)    Dyslipidemia    Paraplegia (HCC)    Coronary artery disease involving native coronary artery of native heart without angina pectoris    Steroid-induced hyperglycemia    Diabetic peripheral neuropathy (HCC)    Weakness 03/09/2020   Chest pain 03/08/2020   Right leg numbness    Malignant melanoma of left side of neck (HCC) 10/25/2018   Facial neuritis 10/25/2018   Myofascial pain syndrome 10/25/2018   Occipital neuralgia of left side 10/25/2018   Male hypogonadism 06/20/2016   Renal lesion 06/20/2016   Insomnia 08/07/2015   Enlarged prostate with lower urinary tract symptoms (LUTS) 11/07/2014   Balanitis 07/30/2012   Bladder neck obstruction 07/30/2012   Prostate nodule 06/18/2012   Recurrent nephrolithiasis 06/18/2012   Benign neoplasm of colon 08/29/2011   DEPRESSION, SITUATIONAL, ACUTE 01/23/2010   Essential hypertension 05/30/2009   Coronary atherosclerosis 05/30/2009   Obstructive sleep apnea 11/28/2008   OBESITY 09/23/2008   ERECTILE  DYSFUNCTION 11/26/2007   Well controlled type 2 diabetes mellitus with peripheral circulatory disorder (HCC) 05/09/2007   Hyperlipidemia 05/09/2007   MYOCARDIAL INFARCTION, HX OF 05/09/2007   DIVERTICULOSIS, COLON 05/09/2007      HPI: Brent [name].  is a 78 y.o. male functional quad chronic sacral ulcer referred by nursing home for mri imaging showing sacral om   12/21/23 initial visit See a&p  Review of Systems: ROS All other ros negative      Past Medical History:  Diagnosis Date   Acute osteomyelitis of metatarsal bone of right foot (HCC) 07/01/2023   Allergy    Aortic atherosclerosis (HCC) 07/01/2023   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 angioplasty of his RCA in 1990. b. bare metal stent placed in the RCA in 2000 followed by rotational atherectomy shortly after for stent restenosis. c. last cath was in 2012 showing stable moderate diffuse CAD. (70% mid LAD, 80% diagonal, 70% Ramus, 40% mid to distal RCA stent restenosis). d. Low risk nuc in 2015.   Diabetes mellitus    Diverticulosis    Elevated CK    Erectile dysfunction    Hemorrhoids    HTN (hypertension)    Hyperlipidemia    Hypertriglyceridemia    Malignant melanoma of left side of neck (HCC) 10/25/2018   Myocardial infarction (HCC)    Obesity    OSA (obstructive sleep apnea)    Persistent disorder of initiating or maintaining sleep    Personal history of colonic polyps 02/05/2003   Renal lesion 06/20/2016   Septic arthritis of foot (HCC) 07/01/2023   Stage IV decubitus ulcer (HCC) 06/30/2023    Social History   Tobacco Use   Smoking status: Former    Current packs/day: 0.00    Average packs/day: 1.5 packs/day for 30.0 years (45.0 ttl pk-yrs)    Types: Cigarettes    Start date: 09/19/1950    Quit date: 09/19/1980    Years since quitting: 43.2   Smokeless tobacco: Never  Vaping Use   Vaping status: Never Used  Substance Use Topics   Alcohol use: Not Currently    Alcohol/week: 7.0 - 14.0 standard drinks of alcohol    Types: 7 - 14 Shots of liquor per week   Drug use: No    Family History  Problem Relation Age of Onset   Lung cancer Mother    COPD Mother    Obesity Mother    Liver cancer Father     Lung cancer Father    Heart disease Father    Heart disease Sister    Colon cancer Neg Hx    Stomach cancer Neg Hx    Pancreatic cancer Neg Hx    Esophageal cancer Neg Hx    Inflammatory bowel disease Neg Hx    Liver disease Neg Hx    Rectal cancer Neg Hx     Allergies  Allergen Reactions   Cipro [Ciprofloxacin Hcl] Other (See Comments)    Hallucinations Allergy not listed on MAR   Neurontin [Gabapentin] Other (See Comments)    Unknown reaction   Levaquin [Levofloxacin] Rash   Penicillins Hives and Rash    Tolerated Unasyn, Augmentin, cefepime and cephalexin    OBJECTIVE: Vitals:   12/21/23 0924  BP: 127/75  Pulse: 85  Resp: 16  SpO2: 100%  Weight: 197 lb 4.8 oz (89.5 kg)  Height: 6\' 1"  (1.854 m)   Body mass index is 26.03 kg/m.  Physical Exam  General/constitutional: no distress, pleasant HEENT: Normocephalic, PER, Conj Clear, EOMI, Oropharynx clear Neck supple CV: rrr no mrg Lungs: clear to auscultation, normal respiratory effort Abd: Soft, Nontender Ext: no edema Skin: No Rash Neuro: paraplegic; able to use arms but not decreased strengths in both hands MSK: no peripheral joint swelling/tenderness/warmth   Lab:  Microbiology:  Serology:  Imaging: Reviewed  12/01/23 mri pelvis 1. Deep bilateral ischial decubitus ulcers extending right down to the bone bilaterally. 2. Left-sided ischial osteomyelitis. 3. Abnormal T2 signal intensity and subsequent enhancement in the sacrum suggesting osteomyelitis. 4. Diffuse myositis involving the pelvic musculature without findings for pyomyositis. 5. No discrete soft tissue abscess. 6. No findings for septic arthritis involving the hips, pubic symphysis or SI joints.  Assessment/plan: Problem List Items Addressed This Visit   None Visit Diagnoses       Sacral decubitus ulcer, stage IV (HCC)    -  Primary         78 yo male hx melanoma scalp, neuromyelitis optica, dm2, gerd,  paraplegia/functional quad, nursing home resident referred by sn[facility] for imaging om of sacrum in setting chronic decub ulcer  Reviewed meds: Cymbalta Atenolol Atorvastatin Baclofen Flonase Iron supplement Glargine Lamotrigine Lasix Metformin Ondansetron Pepcid Rituximab Seroquel Trazodone  Patient has colostomy and suprapubic catheter for diversion of contaminated excretion; these were placed 4 years ago  Patient had infection of sacrum in the past one time   Patient is currently at baseline health. He gets wound care at the snf. No concern about wound care at this time   Explained to patient that while he may have chronic osteomyelitis, the treatment is likely to not work in terms of high relapse or just lack of microbiologic cure  He is on any antibiotics now  If he develop sepsis or local wound infection proven, then he should get 1-2 weeks of antibiotics appropriate for bacteremic syndrome of soft tissue infection   Of note, he is not sure why he is on rituximab. He doesn't have any rheumatologist or hem/oncologist care. Chart epic review suggest he is on that for neuromyelitis optica [facility] - advise him to f/u neurology to see if this needs to be continued   Continue wound care, frequent off loading, and maximizing nutrition Advise checking with primary care why patient is on rituximab        Follow-up: No follow-ups on file.  Raymondo Band, MD [facility] 12/21/2023, 9:32 AM

## 2023-12-21 NOTE — Patient Instructions (Addendum)
 Continue wound care and frequent every 1-2 hour turn; continues pulsatile airbed  No need to repeat any imaging of the pevlis; there will always be some abnormality changes in the bone on imaging   We only treat if you develop fever, chill, increased pus or frank local infection/abscess of the sacral ulcer. And we treat generally 1-2 weeks of antibiotics at a time  No need for id clinic follow up at this time   Please check with your neurologist to see if you still need to be on rituximab

## 2024-06-15 ENCOUNTER — Inpatient Hospital Stay (HOSPITAL_COMMUNITY)
Admission: EM | Admit: 2024-06-15 | Discharge: 2024-06-24 | DRG: 871 | Disposition: A | Discharge status: Skilled Nursing Facility | Source: Skilled Nursing Facility | Attending: Internal Medicine | Admitting: Internal Medicine

## 2024-06-15 ENCOUNTER — Emergency Department (HOSPITAL_COMMUNITY)

## 2024-06-15 ENCOUNTER — Encounter (HOSPITAL_COMMUNITY): Payer: Self-pay

## 2024-06-15 DIAGNOSIS — B964 Proteus (mirabilis) (morganii) as the cause of diseases classified elsewhere: Secondary | ICD-10-CM | POA: Diagnosis present

## 2024-06-15 DIAGNOSIS — E1169 Type 2 diabetes mellitus with other specified complication: Secondary | ICD-10-CM | POA: Diagnosis present

## 2024-06-15 DIAGNOSIS — E1165 Type 2 diabetes mellitus with hyperglycemia: Secondary | ICD-10-CM | POA: Diagnosis not present

## 2024-06-15 DIAGNOSIS — M86152 Other acute osteomyelitis, left femur: Secondary | ICD-10-CM | POA: Insufficient documentation

## 2024-06-15 DIAGNOSIS — E781 Pure hyperglyceridemia: Secondary | ICD-10-CM | POA: Diagnosis present

## 2024-06-15 DIAGNOSIS — E119 Type 2 diabetes mellitus without complications: Principal | ICD-10-CM

## 2024-06-15 DIAGNOSIS — Z23 Encounter for immunization: Secondary | ICD-10-CM | POA: Diagnosis present

## 2024-06-15 DIAGNOSIS — Z1152 Encounter for screening for COVID-19: Secondary | ICD-10-CM | POA: Diagnosis not present

## 2024-06-15 DIAGNOSIS — Y846 Urinary catheterization as the cause of abnormal reaction of the patient, or of later complication, without mention of misadventure at the time of the procedure: Secondary | ICD-10-CM | POA: Diagnosis present

## 2024-06-15 DIAGNOSIS — Z825 Family history of asthma and other chronic lower respiratory diseases: Secondary | ICD-10-CM

## 2024-06-15 DIAGNOSIS — T83518A Infection and inflammatory reaction due to other urinary catheter, initial encounter: Secondary | ICD-10-CM | POA: Diagnosis present

## 2024-06-15 DIAGNOSIS — A419 Sepsis, unspecified organism: Principal | ICD-10-CM | POA: Diagnosis present

## 2024-06-15 DIAGNOSIS — L089 Local infection of the skin and subcutaneous tissue, unspecified: Secondary | ICD-10-CM

## 2024-06-15 DIAGNOSIS — Z881 Allergy status to other antibiotic agents status: Secondary | ICD-10-CM

## 2024-06-15 DIAGNOSIS — L89154 Pressure ulcer of sacral region, stage 4: Secondary | ICD-10-CM | POA: Diagnosis present

## 2024-06-15 DIAGNOSIS — D509 Iron deficiency anemia, unspecified: Secondary | ICD-10-CM | POA: Diagnosis present

## 2024-06-15 DIAGNOSIS — N179 Acute kidney failure, unspecified: Principal | ICD-10-CM | POA: Diagnosis present

## 2024-06-15 DIAGNOSIS — K219 Gastro-esophageal reflux disease without esophagitis: Secondary | ICD-10-CM | POA: Diagnosis present

## 2024-06-15 DIAGNOSIS — Z7401 Bed confinement status: Secondary | ICD-10-CM

## 2024-06-15 DIAGNOSIS — Z8249 Family history of ischemic heart disease and other diseases of the circulatory system: Secondary | ICD-10-CM

## 2024-06-15 DIAGNOSIS — L89324 Pressure ulcer of left buttock, stage 4: Secondary | ICD-10-CM | POA: Diagnosis present

## 2024-06-15 DIAGNOSIS — L02416 Cutaneous abscess of left lower limb: Secondary | ICD-10-CM | POA: Diagnosis present

## 2024-06-15 DIAGNOSIS — R6520 Severe sepsis without septic shock: Secondary | ICD-10-CM | POA: Diagnosis present

## 2024-06-15 DIAGNOSIS — I110 Hypertensive heart disease with heart failure: Secondary | ICD-10-CM | POA: Diagnosis present

## 2024-06-15 DIAGNOSIS — Z87891 Personal history of nicotine dependence: Secondary | ICD-10-CM

## 2024-06-15 DIAGNOSIS — L89899 Pressure ulcer of other site, unspecified stage: Secondary | ICD-10-CM | POA: Diagnosis present

## 2024-06-15 DIAGNOSIS — I9581 Postprocedural hypotension: Secondary | ICD-10-CM | POA: Diagnosis not present

## 2024-06-15 DIAGNOSIS — Z955 Presence of coronary angioplasty implant and graft: Secondary | ICD-10-CM

## 2024-06-15 DIAGNOSIS — N390 Urinary tract infection, site not specified: Secondary | ICD-10-CM | POA: Diagnosis present

## 2024-06-15 DIAGNOSIS — L02419 Cutaneous abscess of limb, unspecified: Secondary | ICD-10-CM | POA: Diagnosis not present

## 2024-06-15 DIAGNOSIS — N319 Neuromuscular dysfunction of bladder, unspecified: Secondary | ICD-10-CM | POA: Diagnosis present

## 2024-06-15 DIAGNOSIS — Z880 Allergy status to penicillin: Secondary | ICD-10-CM

## 2024-06-15 DIAGNOSIS — M8618 Other acute osteomyelitis, other site: Secondary | ICD-10-CM | POA: Diagnosis present

## 2024-06-15 DIAGNOSIS — D72829 Elevated white blood cell count, unspecified: Secondary | ICD-10-CM | POA: Diagnosis not present

## 2024-06-15 DIAGNOSIS — D649 Anemia, unspecified: Secondary | ICD-10-CM | POA: Diagnosis present

## 2024-06-15 DIAGNOSIS — I5030 Unspecified diastolic (congestive) heart failure: Secondary | ICD-10-CM | POA: Diagnosis present

## 2024-06-15 DIAGNOSIS — R6510 Systemic inflammatory response syndrome (SIRS) of non-infectious origin without acute organ dysfunction: Secondary | ICD-10-CM

## 2024-06-15 DIAGNOSIS — D75838 Other thrombocytosis: Secondary | ICD-10-CM | POA: Diagnosis present

## 2024-06-15 DIAGNOSIS — G8250 Quadriplegia, unspecified: Secondary | ICD-10-CM | POA: Diagnosis present

## 2024-06-15 DIAGNOSIS — Z933 Colostomy status: Secondary | ICD-10-CM

## 2024-06-15 DIAGNOSIS — L89159 Pressure ulcer of sacral region, unspecified stage: Secondary | ICD-10-CM | POA: Diagnosis not present

## 2024-06-15 DIAGNOSIS — G373 Acute transverse myelitis in demyelinating disease of central nervous system: Secondary | ICD-10-CM | POA: Diagnosis present

## 2024-06-15 DIAGNOSIS — I5032 Chronic diastolic (congestive) heart failure: Secondary | ICD-10-CM | POA: Diagnosis present

## 2024-06-15 DIAGNOSIS — E876 Hypokalemia: Secondary | ICD-10-CM | POA: Diagnosis not present

## 2024-06-15 DIAGNOSIS — I252 Old myocardial infarction: Secondary | ICD-10-CM

## 2024-06-15 DIAGNOSIS — G4733 Obstructive sleep apnea (adult) (pediatric): Secondary | ICD-10-CM | POA: Diagnosis present

## 2024-06-15 DIAGNOSIS — Z4800 Encounter for change or removal of nonsurgical wound dressing: Secondary | ICD-10-CM

## 2024-06-15 DIAGNOSIS — R627 Adult failure to thrive: Secondary | ICD-10-CM | POA: Diagnosis present

## 2024-06-15 DIAGNOSIS — I10 Essential (primary) hypertension: Secondary | ICD-10-CM | POA: Diagnosis present

## 2024-06-15 DIAGNOSIS — Z85820 Personal history of malignant melanoma of skin: Secondary | ICD-10-CM

## 2024-06-15 DIAGNOSIS — G9341 Metabolic encephalopathy: Secondary | ICD-10-CM | POA: Diagnosis not present

## 2024-06-15 DIAGNOSIS — L89314 Pressure ulcer of right buttock, stage 4: Secondary | ICD-10-CM | POA: Diagnosis present

## 2024-06-15 DIAGNOSIS — Z888 Allergy status to other drugs, medicaments and biological substances status: Secondary | ICD-10-CM

## 2024-06-15 DIAGNOSIS — Z801 Family history of malignant neoplasm of trachea, bronchus and lung: Secondary | ICD-10-CM

## 2024-06-15 DIAGNOSIS — E785 Hyperlipidemia, unspecified: Secondary | ICD-10-CM | POA: Diagnosis present

## 2024-06-15 DIAGNOSIS — Z7984 Long term (current) use of oral hypoglycemic drugs: Secondary | ICD-10-CM

## 2024-06-15 DIAGNOSIS — M86651 Other chronic osteomyelitis, right thigh: Secondary | ICD-10-CM | POA: Diagnosis present

## 2024-06-15 DIAGNOSIS — I2510 Atherosclerotic heart disease of native coronary artery without angina pectoris: Secondary | ICD-10-CM | POA: Diagnosis not present

## 2024-06-15 DIAGNOSIS — Z800 Family history of malignant neoplasm of digestive organs: Secondary | ICD-10-CM

## 2024-06-15 DIAGNOSIS — Z860100 Personal history of colon polyps, unspecified: Secondary | ICD-10-CM

## 2024-06-15 DIAGNOSIS — M8668 Other chronic osteomyelitis, other site: Secondary | ICD-10-CM | POA: Diagnosis present

## 2024-06-15 DIAGNOSIS — Z79899 Other long term (current) drug therapy: Secondary | ICD-10-CM

## 2024-06-15 DIAGNOSIS — Z89421 Acquired absence of other right toe(s): Secondary | ICD-10-CM

## 2024-06-15 LAB — COMPREHENSIVE METABOLIC PANEL WITH GFR
ALT: 48 U/L — ABNORMAL HIGH (ref 0–44)
AST: 40 U/L (ref 15–41)
Albumin: 2.7 g/dL — ABNORMAL LOW (ref 3.5–5.0)
Alkaline Phosphatase: 157 U/L — ABNORMAL HIGH (ref 38–126)
Anion gap: 13 (ref 5–15)
BUN: 32 mg/dL — ABNORMAL HIGH (ref 8–23)
CO2: 20 mmol/L — ABNORMAL LOW (ref 22–32)
Calcium: 8.9 mg/dL (ref 8.9–10.3)
Chloride: 99 mmol/L (ref 98–111)
Creatinine, Ser: 1.41 mg/dL — ABNORMAL HIGH (ref 0.61–1.24)
GFR, Estimated: 51 mL/min — ABNORMAL LOW (ref >60)
Glucose, Bld: 153 mg/dL — ABNORMAL HIGH (ref 70–99)
Potassium: 4.2 mmol/L (ref 3.5–5.1)
Sodium: 131 mmol/L — ABNORMAL LOW (ref 135–145)
Total Bilirubin: 0.5 mg/dL (ref 0.0–1.2)
Total Protein: 6.7 g/dL (ref 6.5–8.1)

## 2024-06-15 LAB — CBC WITH DIFFERENTIAL/PLATELET
Abs Immature Granulocytes: 0.32 K/uL — ABNORMAL HIGH (ref 0.00–0.07)
Basophils Absolute: 0.1 K/uL (ref 0.0–0.1)
Basophils Relative: 0 %
Eosinophils Absolute: 0 K/uL (ref 0.0–0.5)
Eosinophils Relative: 0 %
HCT: 28.8 % — ABNORMAL LOW (ref 39.0–52.0)
Hemoglobin: 8.6 g/dL — ABNORMAL LOW (ref 13.0–17.0)
Immature Granulocytes: 1 %
Lymphocytes Relative: 9 %
Lymphs Abs: 2.1 K/uL (ref 0.7–4.0)
MCH: 23.4 pg — ABNORMAL LOW (ref 26.0–34.0)
MCHC: 29.9 g/dL — ABNORMAL LOW (ref 30.0–36.0)
MCV: 78.5 fL — ABNORMAL LOW (ref 80.0–100.0)
Monocytes Absolute: 1.3 K/uL — ABNORMAL HIGH (ref 0.1–1.0)
Monocytes Relative: 5 %
Neutro Abs: 20.1 K/uL — ABNORMAL HIGH (ref 1.7–7.7)
Neutrophils Relative %: 85 %
Platelets: 450 K/uL — ABNORMAL HIGH (ref 150–400)
RBC: 3.67 MIL/uL — ABNORMAL LOW (ref 4.22–5.81)
RDW: 17.4 % — ABNORMAL HIGH (ref 11.5–15.5)
WBC: 23.9 K/uL — ABNORMAL HIGH (ref 4.0–10.5)
nRBC: 0 % (ref 0.0–0.2)

## 2024-06-15 LAB — URINALYSIS, W/ REFLEX TO CULTURE (INFECTION SUSPECTED)
Glucose, UA: NEGATIVE mg/dL
Hgb urine dipstick: NEGATIVE
Ketones, ur: NEGATIVE mg/dL
Nitrite: NEGATIVE
Protein, ur: >=300 mg/dL — AB
Specific Gravity, Urine: 1.02 (ref 1.005–1.030)
WBC, UA: >50 WBC/hpf (ref 0–5)
pH: 8 (ref 5.0–8.0)

## 2024-06-15 LAB — I-STAT CG4 LACTIC ACID, ED: Lactic Acid, Venous: 1.8 mmol/L (ref 0.5–1.9)

## 2024-06-15 LAB — RESP PANEL BY RT-PCR (RSV, FLU A&B, COVID)  RVPGX2
Influenza A by PCR: NEGATIVE
Influenza B by PCR: NEGATIVE
Resp Syncytial Virus by PCR: NEGATIVE
SARS Coronavirus 2 by RT PCR: NEGATIVE

## 2024-06-15 LAB — CBG MONITORING, ED: Glucose-Capillary: 118 mg/dL — ABNORMAL HIGH (ref 70–99)

## 2024-06-15 LAB — PROCALCITONIN: Procalcitonin: 2.23 ng/mL

## 2024-06-15 LAB — SEDIMENTATION RATE: Sed Rate: 89 mm/h — ABNORMAL HIGH (ref 0–16)

## 2024-06-15 LAB — TSH: TSH: 2.68 u[IU]/mL (ref 0.350–4.500)

## 2024-06-15 LAB — PROTIME-INR
INR: 1.3 — ABNORMAL HIGH (ref 0.8–1.2)
Prothrombin Time: 17 s — ABNORMAL HIGH (ref 11.4–15.2)

## 2024-06-15 LAB — GLUCOSE, CAPILLARY: Glucose-Capillary: 118 mg/dL — ABNORMAL HIGH (ref 70–99)

## 2024-06-15 MED ORDER — ALBUTEROL SULFATE (2.5 MG/3ML) 0.083% IN NEBU: 2.5000 mg | INHALATION_SOLUTION | Freq: Two times a day (BID) | RESPIRATORY_TRACT | Status: DC | PRN

## 2024-06-15 MED ORDER — OXYCODONE HCL 5 MG PO TABS
5.0000 mg | ORAL_TABLET | Freq: Four times a day (QID) | ORAL | Status: DC | PRN
  Administered 2024-06-15 – 2024-06-21 (×7): 5 mg via ORAL
  Filled 2024-06-15 (×7): qty 1

## 2024-06-15 MED ORDER — MUSCLE RUB 10-15 % EX CREA: TOPICAL_CREAM | CUTANEOUS | Status: DC | PRN

## 2024-06-15 MED ORDER — BACLOFEN 10 MG PO TABS
5.0000 mg | ORAL_TABLET | Freq: Three times a day (TID) | ORAL | Status: DC
Start: 2024-06-15 — End: 2024-06-20
  Administered 2024-06-15 – 2024-06-20 (×13): 5 mg via ORAL
  Filled 2024-06-15 (×13): qty 1

## 2024-06-15 MED ORDER — SODIUM CHLORIDE 0.9 % IV SOLN
2.0000 g | Freq: Once | INTRAVENOUS | Status: AC
  Administered 2024-06-15: 2 g via INTRAVENOUS
  Filled 2024-06-15: qty 12.5

## 2024-06-15 MED ORDER — MENTHOL (TOPICAL ANALGESIC) 4 % EX GEL: 1.0000 | Freq: Two times a day (BID) | CUTANEOUS | Status: DC | PRN

## 2024-06-15 MED ORDER — AZELASTINE HCL 0.1 % NA SOLN
2.0000 | Freq: Every day | NASAL | Status: DC
  Administered 2024-06-16 – 2024-06-24 (×9): 2 via NASAL
  Filled 2024-06-15: qty 30

## 2024-06-15 MED ORDER — VANCOMYCIN HCL IN DEXTROSE 1-5 GM/200ML-% IV SOLN: 1000.0000 mg | INTRAVENOUS | Status: DC

## 2024-06-15 MED ORDER — SODIUM CHLORIDE 0.9 % IV SOLN: INTRAVENOUS | Status: AC

## 2024-06-15 MED ORDER — FAMOTIDINE 20 MG PO TABS
20.0000 mg | ORAL_TABLET | Freq: Two times a day (BID) | ORAL | Status: DC
  Administered 2024-06-15 – 2024-06-24 (×17): 20 mg via ORAL
  Filled 2024-06-15 (×18): qty 1

## 2024-06-15 MED ORDER — LORATADINE 10 MG PO TABS
10.0000 mg | ORAL_TABLET | Freq: Every day | ORAL | Status: DC
  Administered 2024-06-15 – 2024-06-24 (×8): 10 mg via ORAL
  Filled 2024-06-15 (×9): qty 1

## 2024-06-15 MED ORDER — SODIUM CHLORIDE 0.9 % IV SOLN
1.0000 g | Freq: Once | INTRAVENOUS | Status: DC
  Administered 2024-06-15: 1 g via INTRAVENOUS
  Filled 2024-06-15: qty 10

## 2024-06-15 MED ORDER — SODIUM CHLORIDE 0.9 % IV BOLUS
500.0000 mL | Freq: Once | INTRAVENOUS | Status: AC
Start: 2024-06-15 — End: 2024-06-15
  Administered 2024-06-15: 500 mL via INTRAVENOUS

## 2024-06-15 MED ORDER — ONDANSETRON HCL 4 MG PO TABS
4.0000 mg | ORAL_TABLET | Freq: Four times a day (QID) | ORAL | Status: DC | PRN
  Administered 2024-06-23: 4 mg via ORAL
  Filled 2024-06-15: qty 1

## 2024-06-15 MED ORDER — OLOPATADINE HCL 0.1 % OP SOLN
1.0000 [drp] | Freq: Two times a day (BID) | OPHTHALMIC | Status: DC
  Administered 2024-06-15 – 2024-06-24 (×18): 1 [drp] via OPHTHALMIC
  Filled 2024-06-15: qty 5

## 2024-06-15 MED ORDER — SENNOSIDES-DOCUSATE SODIUM 8.6-50 MG PO TABS
1.0000 | ORAL_TABLET | Freq: Two times a day (BID) | ORAL | Status: DC
  Administered 2024-06-15 – 2024-06-24 (×17): 1 via ORAL
  Filled 2024-06-15 (×18): qty 1

## 2024-06-15 MED ORDER — TRAZODONE HCL 100 MG PO TABS
100.0000 mg | ORAL_TABLET | Freq: Every day | ORAL | Status: DC
  Administered 2024-06-15 – 2024-06-23 (×9): 100 mg via ORAL
  Filled 2024-06-15 (×9): qty 1

## 2024-06-15 MED ORDER — VANCOMYCIN HCL IN DEXTROSE 1-5 GM/200ML-% IV SOLN
1000.0000 mg | Freq: Once | INTRAVENOUS | Status: AC
  Administered 2024-06-15: 1000 mg via INTRAVENOUS
  Filled 2024-06-15: qty 200

## 2024-06-15 MED ORDER — FLUTICASONE PROPIONATE 50 MCG/ACT NA SUSP
1.0000 | Freq: Every day | NASAL | Status: DC
  Administered 2024-06-15 – 2024-06-23 (×9): 1 via NASAL
  Filled 2024-06-15: qty 16

## 2024-06-15 MED ORDER — ACETAMINOPHEN 325 MG PO TABS
650.0000 mg | ORAL_TABLET | Freq: Four times a day (QID) | ORAL | Status: DC | PRN
  Administered 2024-06-21 – 2024-06-22 (×2): 650 mg via ORAL
  Filled 2024-06-15 (×2): qty 2

## 2024-06-15 MED ORDER — ENOXAPARIN SODIUM 40 MG/0.4ML IJ SOSY
40.0000 mg | PREFILLED_SYRINGE | Freq: Every day | INTRAMUSCULAR | Status: DC
Start: 2024-06-15 — End: 2024-06-24
  Administered 2024-06-15 – 2024-06-23 (×9): 40 mg via SUBCUTANEOUS
  Filled 2024-06-15 (×9): qty 0.4

## 2024-06-15 MED ORDER — ONDANSETRON HCL 4 MG/2ML IJ SOLN
4.0000 mg | Freq: Four times a day (QID) | INTRAMUSCULAR | Status: DC | PRN
  Administered 2024-06-18 – 2024-06-21 (×2): 4 mg via INTRAVENOUS
  Filled 2024-06-15: qty 2

## 2024-06-15 MED ORDER — ALBUTEROL SULFATE 1.25 MG/3ML IN NEBU: 1.0000 | INHALATION_SOLUTION | Freq: Two times a day (BID) | RESPIRATORY_TRACT | Status: DC | PRN

## 2024-06-15 MED ORDER — CARBOXYMETHYLCELLULOSE SODIUM 1 % OP SOLN: 1.0000 [drp] | Freq: Every morning | OPHTHALMIC | Status: DC

## 2024-06-15 MED ORDER — DULOXETINE HCL 30 MG PO CPEP
30.0000 mg | ORAL_CAPSULE | Freq: Every day | ORAL | Status: DC
Start: 2024-06-15 — End: 2024-06-24
  Administered 2024-06-15 – 2024-06-23 (×9): 30 mg via ORAL
  Filled 2024-06-15 (×9): qty 1

## 2024-06-15 MED ORDER — ATORVASTATIN CALCIUM 40 MG PO TABS
40.0000 mg | ORAL_TABLET | Freq: Every day | ORAL | Status: DC
  Administered 2024-06-15 – 2024-06-23 (×9): 40 mg via ORAL
  Filled 2024-06-15 (×9): qty 1

## 2024-06-15 MED ORDER — ACETAMINOPHEN 650 MG RE SUPP: 650.0000 mg | Freq: Four times a day (QID) | RECTAL | Status: DC | PRN

## 2024-06-15 MED ORDER — SODIUM CHLORIDE 0.9 % IV SOLN
2.0000 g | Freq: Two times a day (BID) | INTRAVENOUS | Status: DC
  Administered 2024-06-16: 2 g via INTRAVENOUS
  Filled 2024-06-15: qty 12.5

## 2024-06-15 MED ORDER — INSULIN ASPART 100 UNIT/ML IJ SOLN
0.0000 [IU] | Freq: Three times a day (TID) | INTRAMUSCULAR | Status: DC
  Administered 2024-06-16 – 2024-06-17 (×4): 2 [IU] via SUBCUTANEOUS
  Filled 2024-06-15: qty 0.09

## 2024-06-15 MED ORDER — POLYVINYL ALCOHOL 1.4 % OP SOLN
1.0000 [drp] | Freq: Every day | OPHTHALMIC | Status: DC
  Administered 2024-06-16 – 2024-06-24 (×9): 1 [drp] via OPHTHALMIC
  Filled 2024-06-15: qty 15

## 2024-06-15 MED ORDER — IOHEXOL 300 MG/ML  SOLN
100.0000 mL | Freq: Once | INTRAMUSCULAR | Status: AC | PRN
  Administered 2024-06-15: 100 mL via INTRAVENOUS

## 2024-06-15 MED ORDER — FERROUS SULFATE 325 (65 FE) MG PO TABS
325.0000 mg | ORAL_TABLET | Freq: Every day | ORAL | Status: DC
  Administered 2024-06-17 – 2024-06-24 (×7): 325 mg via ORAL
  Filled 2024-06-15 (×8): qty 1

## 2024-06-15 MED ORDER — SODIUM CHLORIDE 0.9 % IV BOLUS
500.0000 mL | Freq: Once | INTRAVENOUS | Status: AC
  Administered 2024-06-15: 500 mL via INTRAVENOUS

## 2024-06-15 MED ORDER — VITAMIN D 25 MCG (1000 UNIT) PO TABS
2000.0000 [IU] | ORAL_TABLET | Freq: Every day | ORAL | Status: DC
  Administered 2024-06-16 – 2024-06-24 (×7): 2000 [IU] via ORAL
  Filled 2024-06-15 (×9): qty 2

## 2024-06-15 MED ORDER — SODIUM CHLORIDE 0.9 % IV BOLUS
1000.0000 mL | Freq: Once | INTRAVENOUS | Status: AC
  Administered 2024-06-15: 1000 mL via INTRAVENOUS

## 2024-06-15 MED ORDER — LAMOTRIGINE 25 MG PO TABS
50.0000 mg | ORAL_TABLET | Freq: Two times a day (BID) | ORAL | Status: DC
Start: 2024-06-15 — End: 2024-06-24
  Administered 2024-06-15 – 2024-06-24 (×17): 50 mg via ORAL
  Filled 2024-06-15 (×18): qty 2

## 2024-06-15 NOTE — Assessment & Plan Note (Signed)
 Continue pepcid BID

## 2024-06-15 NOTE — ED Provider Notes (Signed)
 [place] EMERGENCY DEPARTMENT AT [facility] Provider Note   CSN: [id number] Arrival date & time: 06/15/24  1036     Patient presents with: Fatigue   Brent [name]. is a 78 y.o. male.   78 yo male with history of quadriparesis/quadriplegia from transverse myelitis, MI, HTN, HLD, malignant melanoma, stage IV decubitus ulcer, DM, with colostomy and indwelling suprapubic catheter, presents via EMS from [facility] rehab with concern for fatigue and decreased appetite for the past few days. Also concern for chronic wounds to legs and sacral ulcer (chronic, worsening, now with purulent drainage). Patient without complaint initially. Unsure if he ate breakfast. States the chronic wound in his sacrum is becoming more painful.        Prior to Admission medications   Medication Sig Start Date End Date Taking? Authorizing Provider  acetaminophen  (TYLENOL ) 500 MG tablet Take 2 tablets (1,000 mg total) by mouth every 6 (six) hours as needed for mild pain (or Fever >/= 101). 05/31/23   Samtani, Jai-Gurmukh, MD  atenolol  (TENORMIN ) 25 MG tablet Take 12.5 mg by mouth in the morning.    [provider]  atorvastatin  (LIPITOR) 40 MG tablet Take 1 tablet (40 mg total) by mouth at bedtime. 05/31/23   Samtani, Jai-Gurmukh, MD  baclofen  5 MG TABS Take 1 tablet (5 mg total) by mouth 3 (three) times daily. 08/08/23   Raenelle Coria, MD  cholecalciferol  (VITAMIN D3) 25 MCG (1000 UNIT) tablet Take 2,000 Units by mouth daily.    [provider]  DULoxetine  (CYMBALTA ) 30 MG capsule Take 1 capsule (30 mg total) by mouth at bedtime. 05/31/23   Samtani, Jai-Gurmukh, MD  famotidine  (PEPCID ) 20 MG tablet Take 20 mg by mouth 2 (two) times daily.    [provider]  ferrous sulfate  325 (65 FE) MG tablet Take 1 tablet (325 mg total) by mouth every other day. 07/27/23   Odell Celinda Balo, MD  furosemide  (LASIX ) 20 MG tablet Take 20 mg by mouth.    [provider]  guaiFENesin   (MUCINEX ) 600 MG 12 hr tablet Take 600 mg by mouth 2 (two) times daily.    [provider]  guaiFENesin -dextromethorphan (ROBITUSSIN DM) 100-10 MG/5ML syrup Take 10 mLs by mouth every 6 (six) hours as needed for cough.    [provider]  insulin  aspart (NOVOLOG ) 100 UNIT/ML injection Inject 0-15 Units into the skin every 4 (four) hours. Sliding scale  CBG 70 - 120: 0 units: CBG 121 - 150: 2 units; CBG 151 - 200: 3 units; CBG 201 - 250: 5 units; CBG 251 - 300: 8 units;CBG 301 - 350: 11 units; CBG 351 - 400: 15 units; CBG > 400 : 15 units and notify MD Patient taking differently: Inject 0-15 Units into the skin See admin instructions. Inject 0-15 units before meals per sliding scale: 70-120 : 0 units 121-150 : 2 units 151-200 : 3 units 201-250 : 5 units 251-300 : 8 units 301-350 : 11 units 351-400 : 15 units > 400 : 15 units, notify MD 06/10/20   Rai, Nydia POUR, MD  insulin  glargine-yfgn (SEMGLEE , YFGN,) 100 UNIT/ML Pen Inject 24 Units into the skin 2 (two) times daily. 07/27/23   Odell Celinda Balo, MD  lamoTRIgine  (LAMICTAL ) 25 MG tablet Take 1 tablet (25 mg total) by mouth 2 (two) times daily for 1 day, THEN 1 tablet (25 mg total) daily for 3 days, THEN 2 tablets (50 mg total) 2 (two) times daily. 08/08/23 09/11/23  Raenelle Coria, MD  lamoTRIgine  (LAMICTAL ) 25 MG tablet Take 2 tablets (50 mg total) by mouth at bedtime for 3 days. 08/09/23 08/12/23  Raenelle Coria, MD  loratadine  (CLARITIN ) 10 MG tablet Take 10 mg by mouth daily.    [provider]  LORazepam  (ATIVAN ) 0.5 MG tablet Take 1 tablet (0.5 mg total) by mouth daily as needed (panic attacks). Patient not taking: Reported on 12/21/2023 08/08/23   Raenelle Coria, MD  Menthol , Topical Analgesic, (BIOFREEZE) 4 % GEL Apply 1 application  topically 2 (two) times daily as needed (pain to back (upper and lower),arms and shoulders).    [provider]  metFORMIN  (GLUCOPHAGE ) 1000 MG tablet Take 1,000 mg by  mouth in the morning and at bedtime.    [provider]  nystatin  powder Apply 1 Application topically daily. Apply to peri-wound bed, sacrum and right ischium daily with routine dressing.    [provider]  ondansetron  (ZOFRAN -ODT) 4 MG disintegrating tablet Take 4 mg by mouth every 8 (eight) hours as needed for nausea or vomiting.    [provider]  phenol (CHLORASEPTIC) 1.4 % LIQD Use as directed 1 spray in the mouth or throat as needed for throat irritation / pain.    [provider]  Polyethyl Glycol-Propyl Glycol (GENTEAL TEARS SEVERE DAY/NIGHT) 0.4-0.3 % GEL ophthalmic gel Place 1 Application into both eyes 4 (four) times daily as needed (dry eyes).    [provider]  polyethylene glycol (MIRALAX  / GLYCOLAX ) 17 g packet Take 17 g by mouth daily as needed for mild constipation. 07/06/23   Cheryle Page, MD  potassium chloride  SA (KLOR-CON  M) 20 MEQ tablet Take 1 tablet (20 mEq total) by mouth daily. Take along with Lasix  07/17/23   Drusilla Sabas RAMAN, MD  QUEtiapine  (SEROQUEL ) 25 MG tablet Take 1 tablet (25 mg total) by mouth at bedtime. 08/08/23 12/21/23  Raenelle Coria, MD  riTUXimab  (RITUXAN  IV) Inject 10 mg into the vein once. Between the 12th-13th of June, December.    [provider]  senna-docusate (SENOKOT-S) 8.6-50 MG tablet Take 1 tablet by mouth 2 (two) times daily.    [provider]  traZODone  (DESYREL ) 100 MG tablet Take 100 mg by mouth at bedtime.    [provider]  vitamin C  (ASCORBIC ACID ) 500 MG tablet Take 500 mg by mouth daily.    [provider]  zinc  sulfate 220 (50 Zn) MG capsule Take 1 capsule (220 mg total) by mouth daily. 07/07/23   Cheryle Page, MD    Allergies: Cipro  [ciprofloxacin  hcl], Neurontin  [gabapentin ], Levaquin  [levofloxacin ], and Penicillins    Review of Systems Negative except as per HPI Updated Vital Signs BP (!) 113/51   Pulse 78   Temp 98.3 F (36.8 C) (Oral)   Resp  (!) 31   Ht 6' (1.829 m)   Wt 68 kg   SpO2 100%   BMI 20.34 kg/m   Physical Exam Vitals and nursing note reviewed.  Constitutional:      General: He is not in acute distress.    Appearance: He is well-developed. He is not diaphoretic.  HENT:     Head: Normocephalic and atraumatic.     Mouth/Throat:     Mouth: Mucous membranes are moist.  Eyes:     Pupils: Pupils are equal, round, and reactive to light.  Cardiovascular:     Rate and Rhythm: Normal rate and regular rhythm.     Heart sounds: Normal heart sounds.  Pulmonary:  Effort: Pulmonary effort is normal.     Breath sounds: Normal breath sounds.  Abdominal:     Palpations: Abdomen is soft.     Tenderness: There is no abdominal tenderness.     Comments: LLQ colostomy, suprapubic catheter  Decubitus ulcer with packing in place, tunneling, purulent drainage   Musculoskeletal:     Right lower leg: No edema.     Left lower leg: No edema.     Comments: Dressings to bilateral lower extremities removed, wounds photographed, dressings replaced- pressure injury to lateral bilateral legs without signs of infection.   Skin:    General: Skin is warm and dry.  Neurological:     Mental Status: He is alert and oriented to person, place, and time.  Psychiatric:        Behavior: Behavior normal.          (all labs ordered are listed, but only abnormal results are displayed) Labs Reviewed  COMPREHENSIVE METABOLIC PANEL WITH GFR - Abnormal; Notable for the following components:      Result Value   Sodium 131 (*)    CO2 20 (*)    Glucose, Bld 153 (*)    BUN 32 (*)    Creatinine, Ser 1.41 (*)    Albumin  2.7 (*)    ALT 48 (*)    Alkaline Phosphatase 157 (*)    GFR, Estimated 51 (*)    All other components within normal limits  CBC WITH DIFFERENTIAL/PLATELET - Abnormal; Notable for the following components:   WBC 23.9 (*)    RBC 3.67 (*)    Hemoglobin 8.6 (*)    HCT 28.8 (*)    MCV 78.5 (*)    MCH 23.4 (*)    MCHC  29.9 (*)    RDW 17.4 (*)    Platelets 450 (*)    Neutro Abs 20.1 (*)    Monocytes Absolute 1.3 (*)    Abs Immature Granulocytes 0.32 (*)    All other components within normal limits  PROTIME-INR - Abnormal; Notable for the following components:   Prothrombin Time 17.0 (*)    INR 1.3 (*)    All other components within normal limits  URINALYSIS, W/ REFLEX TO CULTURE (INFECTION SUSPECTED) - Abnormal; Notable for the following components:   Color, Urine AMBER (*)    APPearance TURBID (*)    Bilirubin Urine MODERATE (*)    Protein, ur >=300 (*)    Leukocytes,Ua MODERATE (*)    Bacteria, UA MANY (*)    All other components within normal limits  CBG MONITORING, ED - Abnormal; Notable for the following components:   Glucose-Capillary 118 (*)    All other components within normal limits  RESP PANEL BY RT-PCR (RSV, FLU A&B, COVID)  RVPGX2  CULTURE, BLOOD (ROUTINE X 2)  CULTURE, BLOOD (ROUTINE X 2)  URINE CULTURE  PROCALCITONIN  HEMOGLOBIN A1C  SEDIMENTATION RATE  C-REACTIVE PROTEIN  I-STAT CG4 LACTIC ACID, ED    EKG: None  Radiology: CT ABDOMEN PELVIS W CONTRAST Result Date: 06/15/2024 CLINICAL DATA:  Decubitus ulcer EXAM: CT ABDOMEN AND PELVIS WITH CONTRAST TECHNIQUE: Multidetector CT imaging of the abdomen and pelvis was performed using the standard protocol following bolus administration of intravenous contrast. RADIATION DOSE REDUCTION: This exam was performed according to the departmental dose-optimization program which includes automated exposure control, adjustment of the mA and/or kV according to patient size and/or use of iterative reconstruction technique. CONTRAST:  OMNIPAQUE  IOHEXOL  300 MG/ML  SOLN COMPARISON:  12/01/2023  common 07/30/2023 FINDINGS: Lower chest: Bibasilar scarring and fibrosis. No acute pleural or parenchymal lung disease. Hepatobiliary: Hepatic steatosis. No focal liver abnormality. Prior cholecystectomy. Pancreas: Unremarkable. No pancreatic ductal  dilatation or surrounding inflammatory changes. Spleen: Borderline splenomegaly, measuring 12.8 x 7.1 x 11.8 cm. No focal parenchymal abnormality. Adrenals/Urinary Tract: The adrenals are unremarkable. Coarse parenchymal calcifications are identified within the upper pole left kidney. There also calcifications dependent within a peripelvic upper pole left renal cyst. Other simple cysts are seen within the bilateral kidneys. No specific imaging follow-up is recommended. No obstructive uropathy within either kidney. The bladder is decompressed with a suprapubic catheter, limiting its evaluation. Stomach/Bowel: No bowel obstruction or ileus. There is a loop colostomy within the left lower quadrant. Normal appendix right lower quadrant. Distal colonic diverticulosis without diverticulitis. There is a large amount of retained stool within the rectal wall, unchanged since prior exam. Vascular/Lymphatic: Aortic atherosclerosis. No enlarged abdominal or pelvic lymph nodes. Reproductive: Prostate is unremarkable. Other: No free fluid or free intraperitoneal gas. No abdominal wall hernia. Musculoskeletal: Large decubitus ulcers are again seen overlying the bilateral ischial tuberosities. Cortical destruction on the left consistent with osteomyelitis is identified on prior MRI. Stable sclerotic changes of the right ischium likely reflect chronic osteomyelitis. On the left, new since prior studies, there is a 4.0 x 6.0 cm complex fluid collection within the posterior upper thigh just inferior to the decubitus ulcer, consistent with abscess. There is also a large sacral decubitus ulcer, though I do not see any bony destruction along the sacrum to suggest osteomyelitis at this time. Reconstructed images demonstrate no additional findings. IMPRESSION: 1. Large decubitus ulcers overlying the sacrum and bilateral ischial tuberosities as above. Findings compatible with acute osteomyelitis within the left ischium, and chronic  osteomyelitis within the right ischium. 2. New 4 x 6 cm complex fluid collection within the subcutaneous tissues posterior left thigh, immediately inferior to the left ischial decubitus ulcer, consistent with abscess. 3. Hepatic steatosis. 4. Borderline splenomegaly. 5. Distal colonic diverticulosis without diverticulitis. Stable loop colostomy left lower quadrant. 6. Stable indwelling suprapubic catheter. Electronically Signed   By: Ozell Daring M.D.   On: 06/15/2024 14:37   DG Chest Port 1 View Result Date: 06/15/2024 CLINICAL DATA:  Sacral and bilateral leg wounds. Clinical concern for sepsis. EXAM: PORTABLE CHEST 1 VIEW COMPARISON:  08/06/2023.  Chest CTA dated 07/19/2023. FINDINGS: Stable borderline enlarged cardiac silhouette. Clear lungs with normal vascularity. Diffuse osteopenia. IMPRESSION: No acute abnormality. Electronically Signed   By: Elspeth Bathe M.D.   On: 06/15/2024 12:06     Procedures   Medications Ordered in the ED  ceFEPIme  (MAXIPIME ) 2 g in sodium chloride  0.9 % 100 mL IVPB (2 g Intravenous New Bag/Given 06/15/24 1755)  insulin  aspart (novoLOG ) injection 0-9 Units ( Subcutaneous Not Given 06/15/24 1720)  ceFEPIme  (MAXIPIME ) 2 g in sodium chloride  0.9 % 100 mL IVPB (has no administration in time range)  vancomycin  (VANCOCIN ) IVPB 1000 mg/200 mL premix (has no administration in time range)  sodium chloride  0.9 % bolus 1,000 mL (0 mLs Intravenous Stopped 06/15/24 1340)  vancomycin  (VANCOCIN ) IVPB 1000 mg/200 mL premix (0 mg Intravenous Stopped 06/15/24 1601)  iohexol  (OMNIPAQUE ) 300 MG/ML solution 100 mL (100 mLs Intravenous Contrast Given 06/15/24 1409)  sodium chloride  0.9 % bolus 500 mL (0 mLs Intravenous Stopped 06/15/24 1755)  Medical Decision Making Amount and/or Complexity of Data Reviewed Labs: ordered. Radiology: ordered.  Risk Prescription drug management. Decision regarding hospitalization.   This patient presents to the ED  for concern of fatigue, worsening decubitus ulcer, this involves an extensive number of treatment options, and is a complaint that carries with it a high risk of complications and morbidity.  The differential diagnosis includes sepsis, UTI, osteomyelitis, intraabdominal infection    Co morbidities / Chronic conditions that complicate the patient evaluation  quadriparesis/quadriplegia from transverse myelitis, MI, HTN, HLD, malignant melanoma, stage IV decubitus ulcer, DM, with colostomy and indwelling suprapubic catheter   Additional history obtained:  Additional history obtained from EMR External records from outside source obtained and reviewed including EF 60-65% on echo dated 07/15/23   Lab Tests:  I Ordered, and personally interpreted labs.  The pertinent results include:  lactic acid normal. CBC with leukocytosis as 24k, hgb 8.6. CMP with AKI with Cr 1.4, previously 0.8. UA from indwelling catheter which was exchanged yesterday with moderate leukocytes and many bacteria, sent for culture. Resp panel negative for covid/flu/rsv.    Imaging Studies ordered:  I ordered imaging studies including CXR, CT a/p   I independently visualized and interpreted imaging which showed CXR without acute process. CT a/p with abscess to posterior left thigh.  I agree with the radiologist interpretation   Cardiac Monitoring: / EKG:  The patient was maintained on a cardiac monitor.  I personally viewed and interpreted the cardiac monitored which showed an underlying rhythm of: Sinus rhythm, rate 85   Problem List / ED Course / Critical interventions / Medication management  78 year old male brought in by EMS from facility with concern for fatigue and decreased appetite also concern for worsening sacral decubitus ulcer now with purulent drainage.  Patient does note more discomfort from his decubitus ulcer recently.  He is afebrile.  Wounds to lower legs were evaluated without dressings, does have  pressure wounds to lateral lower legs.  Dressings were replaced.  With assistance from patient's care team, patient was rolled onto his right side and his decubitus ulcer was evaluated.  He has significant pressure injury to his sacrum with a open area over the lower sacrum which has packing in place, the packing was removed and he had purulent drainage from the area.  Lactic acid normal.  WBC significantly elevated.  Patient's blood pressure is soft, improved with IV fluids.  CT scan obtained to further evaluate and shows a abscess to the posterior left thigh.  This was evaluated by general surgery who has noted the recommendations.  Case was discussed with hospitalist for admission.  Broad-spectrum antibiotics provided. I ordered medication including IVF, rocephin , vancomycin     Reevaluation of the patient after these medicines showed that the patient BP fluctuating, responds to fluids  I have reviewed the patients home medicines and have made adjustments as needed   Consultations Obtained:  I requested consultation with the general surgery- Dr. Ebbie,  and discussed lab and imaging findings as well as pertinent plan - they recommend: will see the patient. Case discussed with Dr. Waddell with [facility] who will consult for admission    Social Determinants of Health:  Lives at facility    Test / Admission - Considered:  Admit       Final diagnoses:  AKI (acute kidney injury)  Urinary tract infection without hematuria, site unspecified  SIRS (systemic inflammatory response syndrome) (HCC)  Thigh abscess  Pressure injury of sacral region, stage  4 [facility])    ED Discharge Orders     None          Beverley Leita DELENA DEVONNA 06/15/24 1809    Laurice Maude BROCKS, MD 06/16/24 [phone]

## 2024-06-15 NOTE — ED Triage Notes (Signed)
 BIB EMS from rehab facility for lethargy, lack of appetite. Bilateral lower leg wounds that are currently wrapped by facility, but concern for pus draining from some of the wounds. Also has concerning infection sacral wound. Has foley in place.

## 2024-06-15 NOTE — Assessment & Plan Note (Signed)
 Colostomy care

## 2024-06-15 NOTE — Assessment & Plan Note (Signed)
 Continue lipitor 40mg  daily.

## 2024-06-15 NOTE — H&P (Signed)
 History and Physical    Patient: Brent [name]. FMW:[id number] DOB: 01-10-1946 DOA: 06/15/2024 DOS: the patient was seen and examined on 06/15/2024 PCP: Feliciano Devoria LABOR, MD  Patient coming from: SNF - bedbound, quadriplegia. [facility]    Chief Complaint: worsening sacral decubitus ulcer and AMS   HPI: Brent [name]. is a 78 y.o. male with medical history significant of CAD s/p DES, T2DM, HTN, HLD, hx of melanoma, OSA, transverse myelitis, stage IV sacral decub ulcer and hx of chronic OM, colostomy and suprapubic catheter who presented to ED with concerns of worsening decubitus ulcer and complaints of fatigue. He has states he has had stage IV ulcers x 4 years and staff thought it was looking worse so brought him to ED. Now has purulent drainage. He states he was out of it at [facility] and was confused as well, but doing much better and back to his baseline mentally.   Wife also reports he has had poor PO intake over the past few weeks with nausea. He has not felt good for this time as well.   Denies any fever/chills, vision changes/headaches, chest pain or palpitations, shortness of breath, he has a chronic cough, no abdominal pain, N/V/D, dysuria or leg swelling.    He does not smoke or drink alcohol .   ER Course:  vitals: afebrile, bp: 104/51, HR: 82, RR: 20, oxygen: 93%RA Pertinent labs: wbc: 23.9, hgb: 8.6, sodium: 131, BUN: 32, creatinine: 1.4, ALT: 48, covid/flu/rsv negative. UA with proteinuria, moderate LE, >50 wbc, many bacteria  CXR: no acute finding CT abdomen/pelvis: 1. Large decubitus ulcers overlying the sacrum and bilateral ischial tuberosities as above. Findings compatible with acute osteomyelitis within the left ischium, and chronic osteomyelitis within the right ischium. 2. New 4 x 6 cm complex fluid collection within the subcutaneous tissues posterior left thigh, immediately inferior to the left ischial decubitus ulcer, consistent with abscess. 3.  Hepatic steatosis. 4. Borderline splenomegaly. 5. Distal colonic diverticulosis without diverticulitis. Stable loop colostomy left lower quadrant. 6. Stable indwelling suprapubic catheter. In ED: general surgery consulted. Given 1.5mL IVF bolus, cefepime  and vanc. BC obtained. [facility] asked to admit.    Review of Systems: As mentioned in the history of present illness. All other systems reviewed and are negative. Past Medical History:  Diagnosis Date   Acute osteomyelitis of metatarsal bone of right foot (HCC) 07/01/2023   Allergy     Aortic atherosclerosis 07/01/2023   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 angioplasty of his RCA in 1990. b. bare metal stent placed in the RCA in 2000 followed by rotational atherectomy shortly after for stent restenosis. c. last cath was in 2012 showing stable moderate diffuse CAD. (70% mid LAD, 80% diagonal, 70% Ramus, 40% mid to distal RCA stent restenosis). d. Low risk nuc in 2015.   Diabetes mellitus    Diverticulosis    Elevated CK    Erectile dysfunction    Hemorrhoids    HTN (hypertension)    Hyperlipidemia    Hypertriglyceridemia    Malignant melanoma of left side of neck (HCC) 10/25/2018   Myocardial infarction (HCC)    Obesity    OSA (obstructive sleep apnea)    Persistent disorder of initiating or maintaining sleep    Personal history of colonic polyps 02/05/2003   Renal lesion 06/20/2016   Septic arthritis of foot (HCC) 07/01/2023   Stage IV decubitus ulcer (HCC) 06/30/2023   Past Surgical  History:  Procedure Laterality Date   AMPUTATION TOE Right 07/02/2023   Procedure: AMPUTATION TOE FIFTH RAY;  Surgeon: Barton Drape, MD;  Location: [facility];  Service: Orthopedics;  Laterality: Right;   BIOPSY  03/26/2022   Procedure: BIOPSY;  Surgeon: Avram Lupita BRAVO, MD;  Location: [facility] ENDOSCOPY;  Service: Gastroenterology;;   CHOLECYSTECTOMY     CORONARY ANGIOPLASTY     CORONARY STENT PLACEMENT      stenting of the right coronary artery with followup rotational  atherectomy. (3 stents placed)   ENDOSCOPIC MUCOSAL RESECTION  10/13/2022   Procedure: ENDOSCOPIC MUCOSAL RESECTION;  Surgeon: Wilhelmenia Aloha Raddle., MD;  Location: [facility];  Service: Gastroenterology;;   ESOPHAGOGASTRODUODENOSCOPY (EGD) WITH PROPOFOL  N/A 03/26/2022   Procedure: ESOPHAGOGASTRODUODENOSCOPY (EGD) WITH PROPOFOL ;  Surgeon: Avram Lupita BRAVO, MD;  Location: [facility] ENDOSCOPY;  Service: Gastroenterology;  Laterality: N/A;   ESOPHAGOGASTRODUODENOSCOPY (EGD) WITH PROPOFOL  N/A 07/11/2022   Procedure: ESOPHAGOGASTRODUODENOSCOPY (EGD) WITH PROPOFOL ;  Surgeon: Wilhelmenia Aloha Raddle., MD;  Location: [facility];  Service: Gastroenterology;  Laterality: N/A;   ESOPHAGOGASTRODUODENOSCOPY (EGD) WITH PROPOFOL  N/A 10/13/2022   Procedure: ESOPHAGOGASTRODUODENOSCOPY (EGD) WITH PROPOFOL ;  Surgeon: Wilhelmenia Aloha Raddle., MD;  Location: [facility];  Service: Gastroenterology;  Laterality: N/A;   EUS N/A 10/13/2022   Procedure: UPPER ENDOSCOPIC ULTRASOUND (EUS) RADIAL;  Surgeon: Wilhelmenia Aloha Raddle., MD;  Location: [facility];  Service: Gastroenterology;  Laterality: N/A;   FINGER SURGERY     right   FOOT SURGERY     right   HEMOSTASIS CLIP PLACEMENT  07/11/2022   Procedure: HEMOSTASIS CLIP PLACEMENT;  Surgeon: Wilhelmenia Aloha Raddle., MD;  Location: [facility];  Service: Gastroenterology;;  ovesco    HEMOSTASIS CLIP PLACEMENT  10/13/2022   Procedure: HEMOSTASIS CLIP PLACEMENT;  Surgeon: Wilhelmenia Aloha Raddle., MD;  Location: [facility];  Service: Gastroenterology;;   HOT HEMOSTASIS N/A 07/11/2022   Procedure: HOT HEMOSTASIS (ARGON PLASMA COAGULATION/BICAP);  Surgeon: Wilhelmenia Aloha Raddle., MD;  Location: [facility];  Service: Gastroenterology;  Laterality: N/A;   HOT HEMOSTASIS N/A 10/13/2022   Procedure: HOT HEMOSTASIS (ARGON PLASMA COAGULATION/BICAP);  Surgeon: Wilhelmenia Aloha Raddle., MD;  Location: [facility];   Service: Gastroenterology;  Laterality: N/A;   INGUINAL HERNIA REPAIR     right   IR FLUORO PROCEDURE UNLISTED  02/21/2023   IR GASTROSTOMY TUBE REMOVAL  02/10/2022   IR REPLACE G-TUBE SIMPLE WO FLUORO  03/14/2023   IR REPLC GASTRO/COLONIC TUBE PERCUT W/FLUORO  12/22/2022   IR REPLC GASTRO/COLONIC TUBE PERCUT W/FLUORO  01/26/2023   IR REPLC GASTRO/COLONIC TUBE PERCUT W/FLUORO  03/06/2023   IR REPLC GASTRO/COLONIC TUBE PERCUT W/FLUORO  04/27/2023   IRRIGATION AND DEBRIDEMENT ABSCESS N/A 06/04/2020   Procedure: IRRIGATION AND DEBRIDEMENT SACRAL WOUND;  Surgeon: Paola Dreama SAILOR, MD;  Location: [facility] OR;  Service: General;  Laterality: N/A;   LAPAROSCOPY N/A 06/04/2020   Procedure: LAPAROSCOPY ASSISTED COLOSTOMY, LAPAROSCOPY GASTROSTOMY;  Surgeon: Paola Dreama SAILOR, MD;  Location: [facility] OR;  Service: General;  Laterality: N/A;   LAPAROSCOPY N/A 05/24/2023   Procedure: LAPAROSCOPY DIAGNOSTIC;  Surgeon: Ebbie Cough, MD;  Location[facility] OR;  Service: General;  Laterality: N/A;   LAPAROTOMY N/A 05/24/2023   Procedure: WEDGED RESECTION OF STOMACH AND GASTROCUTANEOUS FISTULA;  Surgeon: Ebbie Cough, MD;  Location[facility] OR;  Service: General;  Laterality: N/A;   LYSIS OF ADHESION N/A 06/04/2020   Procedure: LYSIS OF ADHESION;  Surgeon: Paola Dreama SAILOR, MD;  Location: [facility] OR;  Service: General;  Laterality: N/A;   LYSIS OF ADHESION N/A 05/24/2023  Procedure: LYSIS OF ADHESION;  Surgeon: Ebbie Cough, MD;  Location: [facility] OR;  Service: General;  Laterality: N/A;   melanoma removal     neck   ORTHOPEDIC SURGERY     foot right   POLYPECTOMY  10/13/2022   Procedure: POLYPECTOMY;  Surgeon: Wilhelmenia Aloha Raddle., MD;  Location: [facility];  Service: Gastroenterology;;   rotator cuff surg     Bil   STENT REMOVAL  10/13/2022   Procedure: CLIP REMOVAL;  Surgeon: Wilhelmenia Aloha Raddle., MD;  Location: [facility];  Service: Gastroenterology;;   ROBLEY MEYER INJECTION  10/13/2022   Procedure: SUBMUCOSAL  LIFTING INJECTION;  Surgeon: Wilhelmenia Aloha Raddle., MD;  Location: [facility];  Service: Gastroenterology;;   SUBMUCOSAL TATTOO INJECTION  10/13/2022   Procedure: SUBMUCOSAL TATTOO INJECTION;  Surgeon: Wilhelmenia Aloha Raddle., MD;  Location: [facility];  Service: Gastroenterology;;   Social History:  reports that he quit smoking about 43 years ago. His smoking use included cigarettes. He started smoking about 73 years ago. He has a 45 pack-year smoking history. He has never used smokeless tobacco. He reports that he does not currently use alcohol  after a past usage of about 7.0 - 14.0 standard drinks of alcohol  per week. He reports that he does not use drugs.  Allergies  Allergen Reactions   Cipro  [Ciprofloxacin  Hcl] Other (See Comments)    Hallucinations Allergy  not listed on MAR   Neurontin  [Gabapentin ] Other (See Comments)    Unknown reaction   Levaquin  [Levofloxacin ] Rash   Penicillins Hives and Rash    Tolerated Unasyn , Augmentin , cefepime  and cephalexin     Family History  Problem Relation Age of Onset   Lung cancer Mother    COPD Mother    Obesity Mother    Liver cancer Father    Lung cancer Father    Heart disease Father    Heart disease Sister    Colon cancer Neg Hx    Stomach cancer Neg Hx    Pancreatic cancer Neg Hx    Esophageal cancer Neg Hx    Inflammatory bowel disease Neg Hx    Liver disease Neg Hx    Rectal cancer Neg Hx     Prior to Admission medications   Medication Sig Start Date End Date Taking? Authorizing Provider  acetaminophen  (TYLENOL ) 500 MG tablet Take 2 tablets (1,000 mg total) by mouth every 6 (six) hours as needed for mild pain (or Fever >/= 101). 05/31/23   Samtani, Jai-Gurmukh, MD  atenolol  (TENORMIN ) 25 MG tablet Take 12.5 mg by mouth in the morning.    [provider]  atorvastatin  (LIPITOR) 40 MG tablet Take 1 tablet (40 mg total) by mouth at bedtime. 05/31/23   Samtani, Jai-Gurmukh, MD  baclofen  5 MG TABS Take 1 tablet (5 mg  total) by mouth 3 (three) times daily. 08/08/23   Raenelle Coria, MD  cholecalciferol  (VITAMIN D3) 25 MCG (1000 UNIT) tablet Take 2,000 Units by mouth daily.    [provider]  DULoxetine  (CYMBALTA ) 30 MG capsule Take 1 capsule (30 mg total) by mouth at bedtime. 05/31/23   Samtani, Jai-Gurmukh, MD  famotidine  (PEPCID ) 20 MG tablet Take 20 mg by mouth 2 (two) times daily.    [provider]  ferrous sulfate  325 (65 FE) MG tablet Take 1 tablet (325 mg total) by mouth every other day. 07/27/23   Odell Celinda Balo, MD  furosemide  (LASIX ) 20 MG tablet Take 20 mg by mouth.    [provider]  guaiFENesin  (MUCINEX )  600 MG 12 hr tablet Take 600 mg by mouth 2 (two) times daily.    [provider]  guaiFENesin -dextromethorphan (ROBITUSSIN DM) 100-10 MG/5ML syrup Take 10 mLs by mouth every 6 (six) hours as needed for cough.    [provider]  insulin  aspart (NOVOLOG ) 100 UNIT/ML injection Inject 0-15 Units into the skin every 4 (four) hours. Sliding scale  CBG 70 - 120: 0 units: CBG 121 - 150: 2 units; CBG 151 - 200: 3 units; CBG 201 - 250: 5 units; CBG 251 - 300: 8 units;CBG 301 - 350: 11 units; CBG 351 - 400: 15 units; CBG > 400 : 15 units and notify MD Patient taking differently: Inject 0-15 Units into the skin See admin instructions. Inject 0-15 units before meals per sliding scale: 70-120 : 0 units 121-150 : 2 units 151-200 : 3 units 201-250 : 5 units 251-300 : 8 units 301-350 : 11 units 351-400 : 15 units > 400 : 15 units, notify MD 06/10/20   Rai, Nydia POUR, MD  insulin  glargine-yfgn (SEMGLEE , YFGN,) 100 UNIT/ML Pen Inject 24 Units into the skin 2 (two) times daily. 07/27/23   Odell Celinda Balo, MD  lamoTRIgine  (LAMICTAL ) 25 MG tablet Take 1 tablet (25 mg total) by mouth 2 (two) times daily for 1 day, THEN 1 tablet (25 mg total) daily for 3 days, THEN 2 tablets (50 mg total) 2 (two) times daily. 08/08/23 09/11/23  Raenelle Coria, MD  lamoTRIgine   (LAMICTAL ) 25 MG tablet Take 2 tablets (50 mg total) by mouth at bedtime for 3 days. 08/09/23 08/12/23  Ghimire, Kuber, MD  loratadine  (CLARITIN ) 10 MG tablet Take 10 mg by mouth daily.    [provider]  LORazepam  (ATIVAN ) 0.5 MG tablet Take 1 tablet (0.5 mg total) by mouth daily as needed (panic attacks). Patient not taking: Reported on 12/21/2023 08/08/23   Ghimire, Kuber, MD  Menthol , Topical Analgesic, (BIOFREEZE) 4 % GEL Apply 1 application  topically 2 (two) times daily as needed (pain to back (upper and lower),arms and shoulders).    [provider]  metFORMIN  (GLUCOPHAGE ) 1000 MG tablet Take 1,000 mg by mouth in the morning and at bedtime.    [provider]  nystatin  powder Apply 1 Application topically daily. Apply to peri-wound bed, sacrum and right ischium daily with routine dressing.    [provider]  ondansetron  (ZOFRAN -ODT) 4 MG disintegrating tablet Take 4 mg by mouth every 8 (eight) hours as needed for nausea or vomiting.    [provider]  phenol (CHLORASEPTIC) 1.4 % LIQD Use as directed 1 spray in the mouth or throat as needed for throat irritation / pain.    [provider]  Polyethyl Glycol-Propyl Glycol (GENTEAL TEARS SEVERE DAY/NIGHT) 0.4-0.3 % GEL ophthalmic gel Place 1 Application into both eyes 4 (four) times daily as needed (dry eyes).    [provider]  polyethylene glycol (MIRALAX  / GLYCOLAX ) 17 g packet Take 17 g by mouth daily as needed for mild constipation. 07/06/23   Cheryle Page, MD  potassium chloride  SA (KLOR-CON  M) 20 MEQ tablet Take 1 tablet (20 mEq total) by mouth daily. Take along with Lasix  07/17/23   Drusilla Sabas RAMAN, MD  QUEtiapine  (SEROQUEL ) 25 MG tablet Take 1 tablet (25 mg total) by mouth at bedtime. 08/08/23 12/21/23  Raenelle Coria, MD  riTUXimab  (RITUXAN  IV) Inject 10 mg into the vein once. Between the 12th-13th of June, December.    [provider]  senna-docusate (SENOKOT-S)  8.6-50 MG tablet Take 1 tablet by mouth 2 (two) times daily.    [provider]  traZODone  (DESYREL ) 100 MG tablet Take 100 mg by mouth at bedtime.    [provider]  vitamin C  (ASCORBIC ACID ) 500 MG tablet Take 500 mg by mouth daily.    [provider]  zinc  sulfate 220 (50 Zn) MG capsule Take 1 capsule (220 mg total) by mouth daily. 07/07/23   Cheryle Page, MD    Physical Exam: Vitals:   06/15/24 1354 06/15/24 1553 06/15/24 1630 06/15/24 1830  BP: (!) 116/49 (!) 96/44 (!) 113/51 (!) 120/48  Pulse: 84 80 78 87  Resp: (!) 25 (!) 24 (!) 31 (!) 31  Temp:  98.3 F (36.8 C)    TempSrc:  Oral    SpO2: 93% 98% 100% 96%  Weight:      Height:       General:  Appears calm and comfortable and is in NAD Eyes:  PERRL, EOMI, normal lids, iris ENT:  grossly normal hearing, lips & tongue, mmm; appropriate dentition Neck:  no LAD, masses or thyromegaly; no carotid bruits Cardiovascular:  RRR, no m/r/g. No LE edema.  Respiratory:   CTA bilaterally with no wheezes/rales/rhonchi.  Normal respiratory effort. Abdomen:  soft, NT, ND, NABS. Suprapubic catheter and colostomy bag. No surrounding erythema.  Back:   normal alignment, no CVAT Skin:  no rash or induration seen on limited exam Musculoskeletal:  grossly normal tone BUE. Paralyzed LE.  Decubitus ulcer and draining abscess on left. Lower extremities with superficial wounds below:      Lower extremity:  No LE edema.  Limited foot exam with no ulcerations.  2+ distal pulses. Psychiatric:  grossly normal mood and affect, speech fluent and appropriate, AOx3 Neurologic:  CN 2-12 grossly intact,moves upper extremities. Decreased hand grip bilaterally.   Radiological Exams on Admission: Independently reviewed - see discussion in A/P where applicable  CT ABDOMEN PELVIS W CONTRAST Result Date: 06/15/2024 CLINICAL DATA:  Decubitus ulcer EXAM: CT ABDOMEN AND PELVIS WITH CONTRAST TECHNIQUE: Multidetector CT imaging of the  abdomen and pelvis was performed using the standard protocol following bolus administration of intravenous contrast. RADIATION DOSE REDUCTION: This exam was performed according to the departmental dose-optimization program which includes automated exposure control, adjustment of the mA and/or kV according to patient size and/or use of iterative reconstruction technique. CONTRAST:  OMNIPAQUE  IOHEXOL  300 MG/ML  SOLN COMPARISON:  12/01/2023 common 07/30/2023 FINDINGS: Lower chest: Bibasilar scarring and fibrosis. No acute pleural or parenchymal lung disease. Hepatobiliary: Hepatic steatosis. No focal liver abnormality. Prior cholecystectomy. Pancreas: Unremarkable. No pancreatic ductal dilatation or surrounding inflammatory changes. Spleen: Borderline splenomegaly, measuring 12.8 x 7.1 x 11.8 cm. No focal parenchymal abnormality. Adrenals/Urinary Tract: The adrenals are unremarkable. Coarse parenchymal calcifications are identified within the upper pole left kidney. There also calcifications dependent within a peripelvic upper pole left renal cyst. Other simple cysts are seen within the bilateral kidneys. No specific imaging follow-up is recommended. No obstructive uropathy within either kidney. The bladder is decompressed with a suprapubic catheter, limiting its evaluation. Stomach/Bowel: No bowel obstruction or ileus. There is a loop colostomy within the left lower quadrant. Normal appendix right lower quadrant. Distal colonic diverticulosis without diverticulitis. There is a large amount of retained stool within the rectal wall, unchanged since prior exam. Vascular/Lymphatic: Aortic atherosclerosis. No enlarged abdominal or pelvic lymph nodes. Reproductive: Prostate is unremarkable. Other: No free fluid or free intraperitoneal gas. No abdominal wall hernia. Musculoskeletal: Large  decubitus ulcers are again seen overlying the bilateral ischial tuberosities. Cortical destruction on the left consistent with  osteomyelitis is identified on prior MRI. Stable sclerotic changes of the right ischium likely reflect chronic osteomyelitis. On the left, new since prior studies, there is a 4.0 x 6.0 cm complex fluid collection within the posterior upper thigh just inferior to the decubitus ulcer, consistent with abscess. There is also a large sacral decubitus ulcer, though I do not see any bony destruction along the sacrum to suggest osteomyelitis at this time. Reconstructed images demonstrate no additional findings. IMPRESSION: 1. Large decubitus ulcers overlying the sacrum and bilateral ischial tuberosities as above. Findings compatible with acute osteomyelitis within the left ischium, and chronic osteomyelitis within the right ischium. 2. New 4 x 6 cm complex fluid collection within the subcutaneous tissues posterior left thigh, immediately inferior to the left ischial decubitus ulcer, consistent with abscess. 3. Hepatic steatosis. 4. Borderline splenomegaly. 5. Distal colonic diverticulosis without diverticulitis. Stable loop colostomy left lower quadrant. 6. Stable indwelling suprapubic catheter. Electronically Signed   By: Ozell Daring M.D.   On: 06/15/2024 14:37   DG Chest Port 1 View Result Date: 06/15/2024 CLINICAL DATA:  Sacral and bilateral leg wounds. Clinical concern for sepsis. EXAM: PORTABLE CHEST 1 VIEW COMPARISON:  08/06/2023.  Chest CTA dated 07/19/2023. FINDINGS: Stable borderline enlarged cardiac silhouette. Clear lungs with normal vascularity. Diffuse osteopenia. IMPRESSION: No acute abnormality. Electronically Signed   By: Elspeth Bathe M.D.   On: 06/15/2024 12:06    EKG: Independently reviewed.  NSR with rate 85; nonspecific ST changes with no evidence of acute ischemia   Labs on Admission: I have personally reviewed the available labs and imaging studies at the time of the admission.  Pertinent labs:   wbc: 23.9,  hgb: 8.6,  sodium: 131,  BUN: 32,  creatinine: 1.4,  ALT: 48, (baseline)   covid/flu/rsv negative.  UA with proteinuria, moderate LE, >50 wbc, many bacteria   Assessment and Plan: Principal Problem:   Severe sepsis secondary to acute OM of left ischium and posterior thigh abscess Active Problems:   AKI (acute kidney injury)   DM type 2 (diabetes mellitus, type 2) (HCC)   (HFpEF) heart failure with preserved ejection fraction (HCC)   Essential hypertension   Coronary artery disease involving native coronary artery of native heart without angina pectoris   Normocytic anemia   Neurogenic bladder   Hyperlipidemia   GERD (gastroesophageal reflux disease)   Colostomy present (HCC)   Chronic osteomyelitis of pelvic region, right (HCC)   Obstructive sleep apnea   Thigh abscess   Acute osteomyelitis involving pelvic region and thigh, left (HCC)    Assessment and Plan: * Severe sepsis secondary to acute OM of left ischium and posterior thigh abscess 78 year old male presenting to ED from SNF for worsening stage IV decubitus ulcer with purulent drainage and AMS found to be septic with leukocytosis of 23.9 and tachypnea with hypotension and AKI secondary to acute OM of left ischium and new left thigh abscess -admit to progressive -continue broad spectrum abx with cefepime  and vanc -give additional 500cc IVF bolus 30mg /kg. Continue IVF  -check inflammatory markers -follow PCT  -BC and urine Culture obtained  -UA also suspicious for infection, but ? Contaminated sample  -lactic acid wnl  -general surgery consulted. Abscess draining, no intervention at this time. Will continue to follow  -wound care  -consider ID consult, will need 1-2 weeks abx. Seen by ID in April 2025  AKI (acute kidney injury) -Creatinine baseline normal, .8 and now 1.4 -Likely combination of pre renal insult from hypoperfusion from hypotension and intra renal from sepsis/nephrotoxic medication  -received IVF, continue overnight -maintain MAP >65 -avoid nephrotoxic drugs -strict I/O   -trend    DM type 2 (diabetes mellitus, type 2) (HCC) Check A1C Hold metformin  Hold long acting. Has had poor PO intake over a few weeks. BS 118.  Sensitive SSI and accuchecks QAC/HS   (HFpEF) heart failure with preserved ejection fraction (HCC) Appears dry Echo in 06/2023 with normal EF and normal diastolic function.small pericardial effusion Hold lasix  today with AKI Strict I/O   Essential hypertension Hypotensive, hold anti HTN for now   Coronary artery disease involving native coronary artery of native heart without angina pectoris Stable, continue medical management   Normocytic anemia ? ACD with chronic OM Baseline hgb around 9-10, stable today Check iron  studies Trend   Neurogenic bladder Suprapubic catheter, continue foley care   Hyperlipidemia Continue lipitor 40mg  daily   GERD (gastroesophageal reflux disease) Continue pepcid  BID   Colostomy present (HCC) Colostomy care   Chronic osteomyelitis of pelvic region, right (HCC) Seen by ID in April of 2025    Obstructive sleep apnea States he no longer uses cpap at night Unsure who told him to stop using      Advance Care Planning:   Code Status: Full Code   Consults: general surgery, wound care   DVT Prophylaxis: lovenox    Family Communication: updated wife by phone; Cy Aran   Severity of Illness: The appropriate patient status for this patient is INPATIENT. Inpatient status is judged to be reasonable and necessary in order to provide the required intensity of service to ensure the patient's safety. The patient's presenting symptoms, physical exam findings, and initial radiographic and laboratory data in the context of their chronic comorbidities is felt to place them at high risk for further clinical deterioration. Furthermore, it is not anticipated that the patient will be medically stable for discharge from the hospital within 2 midnights of admission.   * I certify that at the point of  admission it is my clinical judgment that the patient will require inpatient hospital care spanning beyond 2 midnights from the point of admission due to high intensity of service, high risk for further deterioration and high frequency of surveillance required.*  Author: Isaiah Geralds, MD 06/15/2024 6:55 PM  For on call review [email or web address].

## 2024-06-15 NOTE — Consult Note (Addendum)
 Reason for Consult:sacral decub  Referring Physician: Dr Laurice [name] [name]. is an 78 y.o. male.  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  Has multiple medical issues as well and lives in [place].he has had fatigue and his wife states a cough. Apparently he was sent to ER for worsening decub ulcer.  His WBC is elevated and has a ct that shows known osteo, decub as well as a posterior left thigh abscess. I was asked to see him He is somewhat confused.  Past Medical History:  Diagnosis Date   Acute osteomyelitis of metatarsal bone of right foot (HCC) 07/01/2023   Allergy     Aortic atherosclerosis 07/01/2023   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 angioplasty of his RCA in 1990. b. bare metal stent placed in the RCA in 2000 followed by rotational atherectomy shortly after for stent restenosis. c. last cath was in 2012 showing stable moderate diffuse CAD. (70% mid LAD, 80% diagonal, 70% Ramus, 40% mid to distal RCA stent restenosis). d. Low risk nuc in 2015.   Diabetes mellitus    Diverticulosis    Elevated CK    Erectile dysfunction    Hemorrhoids    HTN (hypertension)    Hyperlipidemia    Hypertriglyceridemia    Malignant melanoma of left side of neck (HCC) 10/25/2018   Myocardial infarction (HCC)    Obesity    OSA (obstructive sleep apnea)    Persistent disorder of initiating or maintaining sleep    Personal history of colonic polyps 02/05/2003   Renal lesion 06/20/2016   Septic arthritis of foot (HCC) 07/01/2023   Stage IV decubitus ulcer (HCC) 06/30/2023    Past Surgical History:  Procedure Laterality Date   AMPUTATION TOE Right 07/02/2023   Procedure: AMPUTATION TOE FIFTH RAY;  Surgeon: Barton Drape, MD;  Location: [facility];  Service: Orthopedics;  Laterality: Right;   BIOPSY  03/26/2022   Procedure: BIOPSY;  Surgeon: Avram Lupita BRAVO, MD;  Location: [facility]  ENDOSCOPY;  Service: Gastroenterology;;   CHOLECYSTECTOMY     CORONARY ANGIOPLASTY     CORONARY STENT PLACEMENT     stenting of the right coronary artery with followup rotational  atherectomy. (3 stents placed)   ENDOSCOPIC MUCOSAL RESECTION  10/13/2022   Procedure: ENDOSCOPIC MUCOSAL RESECTION;  Surgeon: Wilhelmenia Aloha Mickey., MD;  Location: [facility];  Service: Gastroenterology;;   ESOPHAGOGASTRODUODENOSCOPY (EGD) WITH PROPOFOL  N/A 03/26/2022   Procedure: ESOPHAGOGASTRODUODENOSCOPY (EGD) WITH PROPOFOL ;  Surgeon: Avram Lupita BRAVO, MD;  Location: [facility] ENDOSCOPY;  Service: Gastroenterology;  Laterality: N/A;   ESOPHAGOGASTRODUODENOSCOPY (EGD) WITH PROPOFOL  N/A 07/11/2022   Procedure: ESOPHAGOGASTRODUODENOSCOPY (EGD) WITH PROPOFOL ;  Surgeon: Wilhelmenia Aloha Mickey., MD;  Location: [facility];  Service: Gastroenterology;  Laterality: N/A;   ESOPHAGOGASTRODUODENOSCOPY (EGD) WITH PROPOFOL  N/A 10/13/2022   Procedure: ESOPHAGOGASTRODUODENOSCOPY (EGD) WITH PROPOFOL ;  Surgeon: Wilhelmenia Aloha Mickey., MD;  Location: [facility];  Service: Gastroenterology;  Laterality: N/A;   EUS N/A 10/13/2022   Procedure: UPPER ENDOSCOPIC ULTRASOUND (EUS) RADIAL;  Surgeon: Wilhelmenia Aloha Mickey., MD;  Location: [facility];  Service: Gastroenterology;  Laterality: N/A;   FINGER SURGERY     right   FOOT SURGERY     right   HEMOSTASIS CLIP PLACEMENT  07/11/2022   Procedure: HEMOSTASIS CLIP PLACEMENT;  Surgeon: Wilhelmenia Aloha Mickey., MD;  Location: [facility];  Service: Gastroenterology;;  ovesco    HEMOSTASIS CLIP PLACEMENT  10/13/2022   Procedure: HEMOSTASIS CLIP PLACEMENT;  Surgeon: Wilhelmenia Aloha Raddle., MD;  Location: [facility];  Service: Gastroenterology;;   HOT HEMOSTASIS N/A 07/11/2022   Procedure: HOT HEMOSTASIS (ARGON PLASMA COAGULATION/BICAP);  Surgeon: Wilhelmenia Aloha Raddle., MD;  Location: [facility];  Service: Gastroenterology;  Laterality: N/A;   HOT HEMOSTASIS N/A 10/13/2022   Procedure:  HOT HEMOSTASIS (ARGON PLASMA COAGULATION/BICAP);  Surgeon: Wilhelmenia Aloha Raddle., MD;  Location: [facility];  Service: Gastroenterology;  Laterality: N/A;   INGUINAL HERNIA REPAIR     right   IR FLUORO PROCEDURE UNLISTED  02/21/2023   IR GASTROSTOMY TUBE REMOVAL  02/10/2022   IR REPLACE G-TUBE SIMPLE WO FLUORO  03/14/2023   IR REPLC GASTRO/COLONIC TUBE PERCUT W/FLUORO  12/22/2022   IR REPLC GASTRO/COLONIC TUBE PERCUT W/FLUORO  01/26/2023   IR REPLC GASTRO/COLONIC TUBE PERCUT W/FLUORO  03/06/2023   IR REPLC GASTRO/COLONIC TUBE PERCUT W/FLUORO  04/27/2023   IRRIGATION AND DEBRIDEMENT ABSCESS N/A 06/04/2020   Procedure: IRRIGATION AND DEBRIDEMENT SACRAL WOUND;  Surgeon: Paola Dreama SAILOR, MD;  Location: [facility] OR;  Service: General;  Laterality: N/A;   LAPAROSCOPY N/A 06/04/2020   Procedure: LAPAROSCOPY ASSISTED COLOSTOMY, LAPAROSCOPY GASTROSTOMY;  Surgeon: Paola Dreama SAILOR, MD;  Location: [facility] OR;  Service: General;  Laterality: N/A;   LAPAROSCOPY N/A 05/24/2023   Procedure: LAPAROSCOPY DIAGNOSTIC;  Surgeon: Ebbie Cough, MD;  Location: [facility] OR;  Service: General;  Laterality: N/A;   LAPAROTOMY N/A 05/24/2023   Procedure: WEDGED RESECTION OF STOMACH AND GASTROCUTANEOUS FISTULA;  Surgeon: Ebbie Cough, MD;  Location: [facility] OR;  Service: General;  Laterality: N/A;   LYSIS OF ADHESION N/A 06/04/2020   Procedure: LYSIS OF ADHESION;  Surgeon: Paola Dreama SAILOR, MD;  Location: [facility] OR;  Service: General;  Laterality: N/A;   LYSIS OF ADHESION N/A 05/24/2023   Procedure: LYSIS OF ADHESION;  Surgeon: Ebbie Cough, MD;  Location: [facility] OR;  Service: General;  Laterality: N/A;   melanoma removal     neck   ORTHOPEDIC SURGERY     foot right   POLYPECTOMY  10/13/2022   Procedure: POLYPECTOMY;  Surgeon: Mansouraty, Aloha Raddle., MD;  Location: [facility];  Service: Gastroenterology;;   rotator cuff surg     Bil   STENT REMOVAL  10/13/2022   Procedure: CLIP REMOVAL;  Surgeon: Wilhelmenia Aloha Raddle., MD;  Location: [facility]  END[facility];  Service: Gastroenterology;;   SUBMUCOSAL LIFTING INJECTION  10/13/2022   Procedure: SUBMUCOSAL LIFTING INJECTION;  Surgeon: Wilhelmenia Aloha Raddle., MD;  Location: [facility];  Service: Gastroenterology;;   SUBMUCOSAL TATTOO INJECTION  10/13/2022   Procedure: SUBMUCOSAL TATTOO INJECTION;  Surgeon: Wilhelmenia Aloha Raddle., MD;  Location: [facility];  Service: Gastroenterology;;    Family History  Problem Relation Age of Onset   Lung cancer Mother    COPD Mother    Obesity Mother    Liver cancer Father    Lung cancer Father    Heart disease Father    Heart disease Sister    Colon cancer Neg Hx    Stomach cancer Neg Hx    Pancreatic cancer Neg Hx    Esophageal cancer Neg Hx    Inflammatory bowel disease Neg Hx    Liver disease Neg Hx    Rectal cancer Neg Hx     Social History:  reports that he quit smoking about 43 years ago. His smoking use included cigarettes. He started smoking about 73 years ago. He has a 45 pack-year smoking history. He has never used smokeless tobacco. He  reports that he does not currently use alcohol  after a past usage of about 7.0 - 14.0 standard drinks of alcohol  per week. He reports that he does not use drugs.  Allergies:  Allergies  Allergen Reactions   Cipro  [Ciprofloxacin  Hcl] Other (See Comments)    Hallucinations Allergy  not listed on MAR   Neurontin  [Gabapentin ] Other (See Comments)    Unknown reaction   Levaquin  [Levofloxacin ] Rash   Penicillins Hives and Rash    Tolerated Unasyn , Augmentin , cefepime  and cephalexin     Medications: I have reviewed the patient's current medications.  Results for orders placed or performed during the hospital encounter of 06/15/24 (from the past 48 hours)  Comprehensive metabolic panel     Status: Abnormal   Collection Time: 06/15/24 12:24 PM  Result Value Ref Range   Sodium 131 (L) 135 - 145 mmol/L   Potassium 4.2 3.5 - 5.1 mmol/L   Chloride 99 98 - 111 mmol/L   CO2 20 (L) 22 - 32 mmol/L    Glucose, Bld 153 (H) 70 - 99 mg/dL    Comment: Glucose reference range applies only to samples taken after fasting for at least 8 hours.   BUN 32 (H) 8 - 23 mg/dL   Creatinine, Ser 8.58 (H) 0.61 - 1.24 mg/dL   Calcium  8.9 8.9 - 10.3 mg/dL   Total Protein 6.7 6.5 - 8.1 g/dL   Albumin  2.7 (L) 3.5 - 5.0 g/dL   AST 40 15 - 41 U/L   ALT 48 (H) 0 - 44 U/L   Alkaline Phosphatase 157 (H) 38 - 126 U/L   Total Bilirubin 0.5 0.0 - 1.2 mg/dL   GFR, Estimated 51 (L) >60 mL/min    Comment: (NOTE) Calculated using the CKD-EPI Creatinine Equation (2021)    Anion gap 13 5 - 15    Comment: Performed at [facility], [address], [place], [place] [address]  CBC with Differential     Status: Abnormal   Collection Time: 06/15/24 12:24 PM  Result Value Ref Range   WBC 23.9 (H) 4.0 - 10.5 K/uL   RBC 3.67 (L) 4.22 - 5.81 MIL/uL   Hemoglobin 8.6 (L) 13.0 - 17.0 g/dL   HCT 71.1 (L) 60.9 - 47.9 %   MCV 78.5 (L) 80.0 - 100.0 fL   MCH 23.4 (L) 26.0 - 34.0 pg   MCHC 29.9 (L) 30.0 - 36.0 g/dL   RDW 82.5 (H) 88.4 - 84.4 %   Platelets 450 (H) 150 - 400 K/uL   nRBC 0.0 0.0 - 0.2 %   Neutrophils Relative % 85 %   Neutro Abs 20.1 (H) 1.7 - 7.7 K/uL   Lymphocytes Relative 9 %   Lymphs Abs 2.1 0.7 - 4.0 K/uL   Monocytes Relative 5 %   Monocytes Absolute 1.3 (H) 0.1 - 1.0 K/uL   Eosinophils Relative 0 %   Eosinophils Absolute 0.0 0.0 - 0.5 K/uL   Basophils Relative 0 %   Basophils Absolute 0.1 0.0 - 0.1 K/uL   Immature Granulocytes 1 %   Abs Immature Granulocytes 0.32 (H) 0.00 - 0.07 K/uL    Comment: Performed at [facility], [address], [place], [place] [address]  Protime-INR     Status: Abnormal   Collection Time: 06/15/24 12:24 PM  Result Value Ref Range   Prothrombin Time 17.0 (H) 11.4 - 15.2 seconds   INR 1.3 (H) 0.8 - 1.2    Comment: (NOTE) INR goal varies based on  device and disease states. Performed at [facility], [address] [place], [place] [address]   Resp panel by RT-PCR (RSV, Flu A&B, Covid) Anterior Nasal Swab     Status: None   Collection Time: 06/15/24 12:24 PM   Specimen: Anterior Nasal Swab  Result Value Ref Range   SARS Coronavirus 2 by RT PCR NEGATIVE NEGATIVE    Comment: (NOTE) SARS-CoV-2 target nucleic acids are NOT DETECTED.  The SARS-CoV-2 RNA is generally detectable in upper respiratory specimens during the acute phase of infection. The lowest concentration of SARS-CoV-2 viral copies this assay can detect is 138 copies/mL. A negative result does not preclude SARS-Cov-2 infection and should not be used as the sole basis for treatment or other patient management decisions. A negative result may occur with  improper specimen collection/handling, submission of specimen other than nasopharyngeal swab, presence of viral mutation(s) within the areas targeted by this assay, and inadequate number of viral copies(<138 copies/mL). A negative result must be combined with clinical observations, patient history, and epidemiological information. The expected result is Negative.  Fact Sheet for Patients:  BloggerCourse.com  Fact Sheet for Healthcare Providers:  SeriousBroker.it  This test is no t yet approved or cleared by the [place]  FDA and  has been authorized for detection and/or diagnosis of SARS-CoV-2 by FDA under an Emergency Use Authorization (EUA). This EUA will remain  in effect (meaning this test can be used) for the duration of the COVID-19 declaration under Section 564(b)(1) of the Act, 21 U.S.C.section 360bbb-3(b)(1), unless the authorization is terminated  or revoked sooner.       Influenza A by PCR NEGATIVE NEGATIVE   Influenza B by PCR NEGATIVE NEGATIVE    Comment: (NOTE) The Xpert Xpress SARS-CoV-2/FLU/RSV plus assay is intended as an aid in the diagnosis of influenza from Nasopharyngeal swab specimens and should not be  used as a sole basis for treatment. Nasal washings and aspirates are unacceptable for Xpert Xpress SARS-CoV-2/FLU/RSV testing.  Fact Sheet for Patients: BloggerCourse.com  Fact Sheet for Healthcare Providers: SeriousBroker.it  This test is not yet approved or cleared by the [place]  FDA and has been authorized for detection and/or diagnosis of SARS-CoV-2 by FDA under an Emergency Use Authorization (EUA). This EUA will remain in effect (meaning this test can be used) for the duration of the COVID-19 declaration under Section 564(b)(1) of the Act, 21 U.S.C. section 360bbb-3(b)(1), unless the authorization is terminated or revoked.     Resp Syncytial Virus by PCR NEGATIVE NEGATIVE    Comment: (NOTE) Fact Sheet for Patients: BloggerCourse.com  Fact Sheet for Healthcare Providers: SeriousBroker.it  This test is not yet approved or cleared by the [place]  FDA and has been authorized for detection and/or diagnosis of SARS-CoV-2 by FDA under an Emergency Use Authorization (EUA). This EUA will remain in effect (meaning this test can be used) for the duration of the COVID-19 declaration under Section 564(b)(1) of the Act, 21 U.S.C. section 360bbb-3(b)(1), unless the authorization is terminated or revoked.  Performed at [facility], [address], [place], [place] [address]   Urinalysis, w/ Reflex to Culture (Infection Suspected) -Urine, Catheterized     Status: Abnormal   Collection Time: 06/15/24 12:25 PM  Result Value Ref Range   Specimen Source URINE, CATHETERIZED    Color, Urine AMBER (A) YELLOW    Comment: BIOCHEMICALS MAY BE AFFECTED BY COLOR   APPearance TURBID (A) CLEAR   Specific Gravity, Urine 1.020 1.005 - 1.030  pH 8.0 5.0 - 8.0   Glucose, UA NEGATIVE NEGATIVE mg/dL   Hgb urine dipstick NEGATIVE NEGATIVE   Bilirubin Urine MODERATE (A)  NEGATIVE   Ketones, ur NEGATIVE NEGATIVE mg/dL   Protein, ur >=699 (A) NEGATIVE mg/dL   Nitrite NEGATIVE NEGATIVE   Leukocytes,Ua MODERATE (A) NEGATIVE   RBC / HPF 0-5 0 - 5 RBC/hpf   WBC, UA >50 0 - 5 WBC/hpf    Comment:        Reflex urine culture not performed if WBC <=10, OR if Squamous epithelial cells >5. If Squamous epithelial cells >5 suggest recollection.    Bacteria, UA MANY (A) NONE SEEN   Squamous Epithelial / HPF 0-5 0 - 5 /HPF   WBC Clumps PRESENT    Mucus PRESENT     Comment: Performed at [facility], [address], [place], [place] [address]  I-Stat Lactic Acid, ED     Status: None   Collection Time: 06/15/24 12:48 PM  Result Value Ref Range   Lactic Acid, Venous 1.8 0.5 - 1.9 mmol/L    CT ABDOMEN PELVIS W CONTRAST Result Date: 06/15/2024 CLINICAL DATA:  Decubitus ulcer EXAM: CT ABDOMEN AND PELVIS WITH CONTRAST TECHNIQUE: Multidetector CT imaging of the abdomen and pelvis was performed using the standard protocol following bolus administration of intravenous contrast. RADIATION DOSE REDUCTION: This exam was performed according to the departmental dose-optimization program which includes automated exposure control, adjustment of the mA and/or kV according to patient size and/or use of iterative reconstruction technique. CONTRAST:  OMNIPAQUE  IOHEXOL  300 MG/ML  SOLN COMPARISON:  12/01/2023 common 07/30/2023 FINDINGS: Lower chest: Bibasilar scarring and fibrosis. No acute pleural or parenchymal lung disease. Hepatobiliary: Hepatic steatosis. No focal liver abnormality. Prior cholecystectomy. Pancreas: Unremarkable. No pancreatic ductal dilatation or surrounding inflammatory changes. Spleen: Borderline splenomegaly, measuring 12.8 x 7.1 x 11.8 cm. No focal parenchymal abnormality. Adrenals/Urinary Tract: The adrenals are unremarkable. Coarse parenchymal calcifications are identified within the upper pole left kidney. There also calcifications dependent  within a peripelvic upper pole left renal cyst. Other simple cysts are seen within the bilateral kidneys. No specific imaging follow-up is recommended. No obstructive uropathy within either kidney. The bladder is decompressed with a suprapubic catheter, limiting its evaluation. Stomach/Bowel: No bowel obstruction or ileus. There is a loop colostomy within the left lower quadrant. Normal appendix right lower quadrant. Distal colonic diverticulosis without diverticulitis. There is a large amount of retained stool within the rectal wall, unchanged since prior exam. Vascular/Lymphatic: Aortic atherosclerosis. No enlarged abdominal or pelvic lymph nodes. Reproductive: Prostate is unremarkable. Other: No free fluid or free intraperitoneal gas. No abdominal wall hernia. Musculoskeletal: Large decubitus ulcers are again seen overlying the bilateral ischial tuberosities. Cortical destruction on the left consistent with osteomyelitis is identified on prior MRI. Stable sclerotic changes of the right ischium likely reflect chronic osteomyelitis. On the left, new since prior studies, there is a 4.0 x 6.0 cm complex fluid collection within the posterior upper thigh just inferior to the decubitus ulcer, consistent with abscess. There is also a large sacral decubitus ulcer, though I do not see any bony destruction along the sacrum to suggest osteomyelitis at this time. Reconstructed images demonstrate no additional findings. IMPRESSION: 1. Large decubitus ulcers overlying the sacrum and bilateral ischial tuberosities as above. Findings compatible with acute osteomyelitis within the left ischium, and chronic osteomyelitis within the right ischium. 2. New 4 x 6 cm complex fluid collection within the subcutaneous tissues posterior left thigh, immediately inferior  to the left ischial decubitus ulcer, consistent with abscess. 3. Hepatic steatosis. 4. Borderline splenomegaly. 5. Distal colonic diverticulosis without diverticulitis.  Stable loop colostomy left lower quadrant. 6. Stable indwelling suprapubic catheter. Electronically Signed   By: Ozell Daring M.D.   On: 06/15/2024 14:37   DG Chest Port 1 View Result Date: 06/15/2024 CLINICAL DATA:  Sacral and bilateral leg wounds. Clinical concern for sepsis. EXAM: PORTABLE CHEST 1 VIEW COMPARISON:  08/06/2023.  Chest CTA dated 07/19/2023. FINDINGS: Stable borderline enlarged cardiac silhouette. Clear lungs with normal vascularity. Diffuse osteopenia. IMPRESSION: No acute abnormality. Electronically Signed   By: Elspeth Bathe M.D.   On: 06/15/2024 12:06    Review of Systems  Unable to perform ROS: Mental status change   Blood pressure (!) 116/49, pulse 84, temperature 98.3 F (36.8 C), temperature source Oral, resp. rate (!) 25, height 6' (1.829 m), weight 68 kg, SpO2 93%. Physical Exam Vitals reviewed.  Constitutional:      Appearance: He is ill-appearing.  Cardiovascular:     Rate and Rhythm: Normal rate and regular rhythm.  Abdominal:     General: There is no distension.     Palpations: Abdomen is soft.     Comments: Sp tube in place Colostomy functional  Musculoskeletal:     Comments: Thin skin over sacrum, decub ulcer Posterior left thigh/ischium there is opening draining purulence, no real erythema  Psychiatric:        Mood and Affect: Mood normal.     Assessment/Plan: Posterior left thigh abscess Sacral decubitus ulcer -I was able to probe the area of the ulcer. There was purulence present and I drained a fair amount of this. I then packed this and redressed the wound -I dont think this needs to go to the OR -recommend bid dressing changes, WOC to see him -abx -we will follow up Monday  I reviewed all labs, vitals, discussed this with his wife and him. Reviewed the ct scan with possible abscess  Donnice Bury 06/15/2024, 3:45 PM

## 2024-06-15 NOTE — Progress Notes (Signed)
 Pharmacy Antibiotic Note  Brent [name]. is a 78 y.o. male admitted on 06/15/2024 with wound infections.  Pharmacy has been consulted for Vanco, Cefepime  dosing.  Active Problem(s): lethargy, lack of appetite. BL LE wounds with pus draining. Also concerned for sacral wound infection  ID: LE and sacral wound infections Afebrile, WBC 23.9, Scr 1.41  Vanco 9/27>> Cefepime  9/27>>  Plan: Vanco 1g IV x 1 Vancomycin  1000 mg IV Q 24 hrs. Goal AUC 400-550. Expected AUC: 473 SCr used: 1.41  Cefepime  2g IV x 1 then 2g IV q12 hrs     Height: 6' (182.9 cm) Weight: 68 kg (150 lb) IBW/kg (Calculated) : 77.6  Temp (24hrs), Avg:98.3 F (36.8 C), Min:98.3 F (36.8 C), Max:98.3 F (36.8 C)  Recent Labs  Lab 06/15/24 1224 06/15/24 1248  WBC 23.9*  --   CREATININE 1.41*  --   LATICACIDVEN  --  1.8    Estimated Creatinine Clearance: 40.9 mL/min (A) (by C-G formula based on SCr of 1.41 mg/dL (H)).    Allergies  Allergen Reactions   Cipro  [Ciprofloxacin  Hcl] Other (See Comments)    Hallucinations Allergy  not listed on MAR   Neurontin  [Gabapentin ] Other (See Comments)    Unknown reaction   Levaquin  [Levofloxacin ] Rash   Penicillins Hives and Rash    Tolerated Unasyn , Augmentin , cefepime  and cephalexin     Brent [name] Karoline Marina, PharmD, BCPS Clinical Staff Pharmacist  Marina Camelia Karoline 06/15/2024 5:32 PM

## 2024-06-15 NOTE — Progress Notes (Signed)
 ED Pharmacy Antibiotic Sign Off An antibiotic consult was received from an ED provider for Vanco/Cefepime  per pharmacy dosing for decub ulcer. A chart review was completed to assess appropriateness.   The following one time order(s) were placed:  Vanco 1g IV x 1 Cefepime  2g IV x 1  Further antibiotic and/or antibiotic pharmacy consults should be ordered by the admitting provider if indicated.   Thank you for allowing pharmacy to be a part of this patient's care.   Casimir Salines [place], [facility]  Clinical Pharmacist 06/15/24 1:29 PM

## 2024-06-15 NOTE — Assessment & Plan Note (Signed)
 Hypotensive, hold anti HTN for now

## 2024-06-15 NOTE — Assessment & Plan Note (Signed)
 Appears dry Echo in 06/2023 with normal EF and normal diastolic function.small pericardial effusion Hold lasix  today with AKI Strict I/O

## 2024-06-15 NOTE — Assessment & Plan Note (Signed)
 Suprapubic catheter, continue foley care

## 2024-06-15 NOTE — Assessment & Plan Note (Addendum)
 States he no longer uses cpap at night Unsure who told him to stop using

## 2024-06-15 NOTE — Assessment & Plan Note (Signed)
?   ACD with chronic OM Baseline hgb around 9-10, stable today Check iron  studies Trend

## 2024-06-15 NOTE — Assessment & Plan Note (Addendum)
 Check A1C Hold metformin  Hold long acting. Has had poor PO intake over a few weeks. BS 118.  Sensitive SSI and accuchecks QAC/HS

## 2024-06-15 NOTE — Assessment & Plan Note (Signed)
Stable, continue medical management.

## 2024-06-15 NOTE — Assessment & Plan Note (Signed)
 Seen by ID in April of 2025

## 2024-06-15 NOTE — Assessment & Plan Note (Addendum)
-  Creatinine baseline normal, .8 and now 1.4 -Likely combination of pre renal insult from hypoperfusion from hypotension and intra renal from sepsis/nephrotoxic medication  -received IVF, continue overnight -maintain MAP >65 -avoid nephrotoxic drugs -strict I/O  -trend

## 2024-06-15 NOTE — Assessment & Plan Note (Addendum)
 78 year old male presenting [facility] ED from [facility] for worsening stage IV decubitus ulcer with purulent drainage and AMS found to be septic with leukocytosis of 23.9 and tachypnea with hypotension and AKI secondary to acute OM of left ischium and new left thigh abscess -admit to progressive -continue broad spectrum abx with cefepime  and vanc -give additional 500cc IVF bolus 30mg /kg. Continue IVF  -check inflammatory markers -follow PCT  -BC and urine Culture obtained  -UA also suspicious for infection, but ? Contaminated sample  -lactic acid wnl  -general surgery consulted. Abscess draining, no intervention at this time. Will continue to follow  -wound care  -consider ID consult, will need 1-2 weeks abx. Seen by ID in April 2025

## 2024-06-16 ENCOUNTER — Encounter (HOSPITAL_COMMUNITY): Payer: Self-pay | Admitting: Family Medicine

## 2024-06-16 ENCOUNTER — Other Ambulatory Visit: Payer: Self-pay

## 2024-06-16 DIAGNOSIS — G373 Acute transverse myelitis in demyelinating disease of central nervous system: Secondary | ICD-10-CM

## 2024-06-16 DIAGNOSIS — L02419 Cutaneous abscess of limb, unspecified: Secondary | ICD-10-CM

## 2024-06-16 DIAGNOSIS — L89154 Pressure ulcer of sacral region, stage 4: Secondary | ICD-10-CM | POA: Diagnosis not present

## 2024-06-16 DIAGNOSIS — N179 Acute kidney failure, unspecified: Secondary | ICD-10-CM | POA: Diagnosis not present

## 2024-06-16 DIAGNOSIS — N390 Urinary tract infection, site not specified: Secondary | ICD-10-CM

## 2024-06-16 DIAGNOSIS — A419 Sepsis, unspecified organism: Secondary | ICD-10-CM | POA: Diagnosis not present

## 2024-06-16 DIAGNOSIS — R6510 Systemic inflammatory response syndrome (SIRS) of non-infectious origin without acute organ dysfunction: Secondary | ICD-10-CM | POA: Diagnosis not present

## 2024-06-16 DIAGNOSIS — D72829 Elevated white blood cell count, unspecified: Secondary | ICD-10-CM | POA: Diagnosis not present

## 2024-06-16 DIAGNOSIS — L89159 Pressure ulcer of sacral region, unspecified stage: Secondary | ICD-10-CM | POA: Diagnosis not present

## 2024-06-16 DIAGNOSIS — L02416 Cutaneous abscess of left lower limb: Secondary | ICD-10-CM

## 2024-06-16 LAB — COMPREHENSIVE METABOLIC PANEL WITH GFR
ALT: 37 U/L (ref 0–44)
AST: 35 U/L (ref 15–41)
Albumin: 2.3 g/dL — ABNORMAL LOW (ref 3.5–5.0)
Alkaline Phosphatase: 159 U/L — ABNORMAL HIGH (ref 38–126)
Anion gap: 11 (ref 5–15)
BUN: 23 mg/dL (ref 8–23)
CO2: 19 mmol/L — ABNORMAL LOW (ref 22–32)
Calcium: 8.4 mg/dL — ABNORMAL LOW (ref 8.9–10.3)
Chloride: 105 mmol/L (ref 98–111)
Creatinine, Ser: 0.97 mg/dL (ref 0.61–1.24)
GFR, Estimated: >60 mL/min (ref >60)
Glucose, Bld: 105 mg/dL — ABNORMAL HIGH (ref 70–99)
Potassium: 3.7 mmol/L (ref 3.5–5.1)
Sodium: 135 mmol/L (ref 135–145)
Total Bilirubin: 0.2 mg/dL (ref 0.0–1.2)
Total Protein: 5.9 g/dL — ABNORMAL LOW (ref 6.5–8.1)

## 2024-06-16 LAB — FERRITIN: Ferritin: 198 ng/mL (ref 24–336)

## 2024-06-16 LAB — BLOOD CULTURE ID PANEL (REFLEXED) - BCID2

## 2024-06-16 LAB — CBC
HCT: 26.9 % — ABNORMAL LOW (ref 39.0–52.0)
Hemoglobin: 7.5 g/dL — ABNORMAL LOW (ref 13.0–17.0)
MCH: 22.3 pg — ABNORMAL LOW (ref 26.0–34.0)
MCHC: 27.9 g/dL — ABNORMAL LOW (ref 30.0–36.0)
MCV: 80.1 fL (ref 80.0–100.0)
Platelets: 384 K/uL (ref 150–400)
RBC: 3.36 MIL/uL — ABNORMAL LOW (ref 4.22–5.81)
RDW: 17.6 % — ABNORMAL HIGH (ref 11.5–15.5)
WBC: 17.2 K/uL — ABNORMAL HIGH (ref 4.0–10.5)
nRBC: 0 % (ref 0.0–0.2)

## 2024-06-16 LAB — GLUCOSE, CAPILLARY
Glucose-Capillary: 88 mg/dL (ref 70–99)
Glucose-Capillary: 161 mg/dL — ABNORMAL HIGH (ref 70–99)
Glucose-Capillary: 164 mg/dL — ABNORMAL HIGH (ref 70–99)
Glucose-Capillary: 168 mg/dL — ABNORMAL HIGH (ref 70–99)

## 2024-06-16 LAB — C-REACTIVE PROTEIN: CRP: 28.3 mg/dL — ABNORMAL HIGH (ref <1.0)

## 2024-06-16 LAB — VITAMIN B12: Vitamin B-12: 322 pg/mL (ref 180–914)

## 2024-06-16 LAB — IRON AND TIBC
Iron: 10 ug/dL — ABNORMAL LOW (ref 45–182)
Saturation Ratios: 6 % — ABNORMAL LOW (ref 17.9–39.5)
TIBC: 168 ug/dL — ABNORMAL LOW (ref 250–450)
UIBC: 158 ug/dL

## 2024-06-16 LAB — HEMOGLOBIN A1C
Hgb A1c MFr Bld: 7.1 % — ABNORMAL HIGH (ref 4.8–5.6)
Mean Plasma Glucose: 157.07 mg/dL

## 2024-06-16 LAB — MRSA NEXT GEN BY PCR, NASAL: MRSA by PCR Next Gen: DETECTED — AB

## 2024-06-16 MED ORDER — CHLORHEXIDINE GLUCONATE CLOTH 2 % EX PADS
6.0000 | MEDICATED_PAD | Freq: Every day | CUTANEOUS | Status: AC
  Administered 2024-06-16 – 2024-06-20 (×4): 6 via TOPICAL

## 2024-06-16 MED ORDER — MUPIROCIN 2 % EX OINT
1.0000 | TOPICAL_OINTMENT | Freq: Two times a day (BID) | CUTANEOUS | Status: AC
  Administered 2024-06-16 – 2024-06-20 (×10): 1 via NASAL
  Filled 2024-06-16 (×2): qty 22

## 2024-06-16 MED ORDER — SODIUM CHLORIDE 0.9 % IV SOLN: 2.0000 g | Freq: Three times a day (TID) | INTRAVENOUS | Status: DC

## 2024-06-16 MED ORDER — VANCOMYCIN HCL 1250 MG/250ML IV SOLN
1250.0000 mg | INTRAVENOUS | Status: DC
  Administered 2024-06-16 – 2024-06-19 (×4): 1250 mg via INTRAVENOUS
  Filled 2024-06-16 (×4): qty 250

## 2024-06-16 MED ORDER — SODIUM CHLORIDE 0.9 % IV SOLN
2.0000 g | Freq: Two times a day (BID) | INTRAVENOUS | Status: DC
  Administered 2024-06-16 – 2024-06-19 (×7): 2 g via INTRAVENOUS
  Filled 2024-06-16 (×7): qty 12.5

## 2024-06-16 NOTE — Progress Notes (Signed)
 Pharmacy Antibiotic Note  Brent [name]. is a 78 y.o. male admitted on 06/15/2024 with LE and sacral wound infections with purulent discharge.  PMH includes chronic osteomyelitis and suprapubic catheter.  Pharmacy has been consulted for Vanco, Cefepime  dosing.  Plan: Cefepime  2g IV q12h Increase to Vancomycin  1250 mg IV q24h  (Est AUC 489, SCr 1, TBW < IBW) Measure Vanc levels as needed.  Goal AUC = 400 - 550 Follow up renal function, culture results, and clinical course.     Height: 6' (182.9 cm) Weight: 68 kg (150 lb) IBW/kg (Calculated) : 77.6  Temp (24hrs), Avg:98.5 F (36.9 C), Min:98.3 F (36.8 C), Max:99.2 F (37.3 C)  Recent Labs  Lab 06/15/24 1224 06/15/24 1248 06/16/24 0438  WBC 23.9*  --  17.2*  CREATININE 1.41*  --  0.97  LATICACIDVEN  --  1.8  --     Estimated Creatinine Clearance: 59.4 mL/min (by C-G formula based on SCr of 0.97 mg/dL).    Allergies  Allergen Reactions   Cipro  [Ciprofloxacin  Hcl] Other (See Comments)    Hallucinations   Neurontin  [Gabapentin ] Other (See Comments)    Hallucinations   Levaquin  [Levofloxacin ] Rash   Penicillins Hives, Rash and Other (See Comments)    Tolerated Unasyn , Augmentin , cefepime  and cephalexin     Antimicrobials this admission:  9/27 Cefepime  >> 9/27 vancomycin  >>   Dose adjustments this admission:    Microbiology results:  9/27 Resp panel: neg covid, flu, rsv 9/27 BCx: 1/4 bottles GPC 9/27 BCID: staph species 9/27 UCx: >100k Preoteus mirabilis 9/28 MRSA PCR: detected   [name] Hasting PharmD, BCPS [facility] main pharmacy [phone] 06/16/2024 1:48 PM

## 2024-06-16 NOTE — Progress Notes (Signed)
 PHARMACY - PHYSICIAN COMMUNICATION CRITICAL VALUE ALERT - BLOOD CULTURE IDENTIFICATION (BCID)  Brent [name]. is an 78 y.o. male who presented to [facility] on 06/15/2024 with osteomyelitis.  Assessment:  BCID + Staph species (no methicillin resistance) in 1 out of 4 bottles; likely contaminant  Name of physician (or Provider) ContactedBETHA WENDI Cone, NP  Current antibiotics: Vancomycin  and Cefepime   Changes to prescribed antibiotics recommended:  Patient is on recommended antibiotics - No changes needed  Results for orders placed or performed during the hospital encounter of 06/15/24  Blood Culture ID Panel (Reflexed) (Collected: 06/15/2024 12:27 PM)  Result Value Ref Range   Enterococcus faecalis NOT DETECTED NOT DETECTED   Enterococcus Faecium NOT DETECTED NOT DETECTED   Listeria monocytogenes NOT DETECTED NOT DETECTED   Staphylococcus species DETECTED (A) NOT DETECTED   Staphylococcus aureus (BCID) NOT DETECTED NOT DETECTED   Staphylococcus epidermidis NOT DETECTED NOT DETECTED   Staphylococcus lugdunensis NOT DETECTED NOT DETECTED   Streptococcus species NOT DETECTED NOT DETECTED   Streptococcus agalactiae NOT DETECTED NOT DETECTED   Streptococcus pneumoniae NOT DETECTED NOT DETECTED   Streptococcus pyogenes NOT DETECTED NOT DETECTED   A.calcoaceticus-baumannii NOT DETECTED NOT DETECTED   Bacteroides fragilis NOT DETECTED NOT DETECTED   Enterobacterales NOT DETECTED NOT DETECTED   Enterobacter cloacae complex NOT DETECTED NOT DETECTED   Escherichia coli NOT DETECTED NOT DETECTED   Klebsiella aerogenes NOT DETECTED NOT DETECTED   Klebsiella oxytoca NOT DETECTED NOT DETECTED   Klebsiella pneumoniae NOT DETECTED NOT DETECTED   Proteus species NOT DETECTED NOT DETECTED   Salmonella species NOT DETECTED NOT DETECTED   Serratia marcescens NOT DETECTED NOT DETECTED   Haemophilus influenzae NOT DETECTED NOT DETECTED   Neisseria meningitidis NOT DETECTED NOT DETECTED    Pseudomonas aeruginosa NOT DETECTED NOT DETECTED   Stenotrophomonas maltophilia NOT DETECTED NOT DETECTED   Candida albicans NOT DETECTED NOT DETECTED   Candida auris NOT DETECTED NOT DETECTED   Candida glabrata NOT DETECTED NOT DETECTED   Candida krusei NOT DETECTED NOT DETECTED   Candida parapsilosis NOT DETECTED NOT DETECTED   Candida tropicalis NOT DETECTED NOT DETECTED   Cryptococcus neoformans/gattii NOT DETECTED NOT DETECTED    Arvin Gauss, PharmD 06/16/2024  6:43 AM

## 2024-06-16 NOTE — Progress Notes (Signed)
 Attempted to contact the patient's wife as the patient requested. Left voicemail for the patient's wife to call the hospital.

## 2024-06-16 NOTE — Plan of Care (Signed)

## 2024-06-16 NOTE — Consult Note (Signed)
 WOC Nurse Consult Note: Reason for Consult: stage IV decub ulcer with abscess  Incidentally noted to have multiple wounds Wound type: Full thickness ulceration LLE Bilateral ischial pressure injuries; Stage 4 Sacral Pressure Injury Stage 3  Left foot plantar surface wound Pressure Injury POA: Yes Measurement:see nursing flow sheet  Wound bed:see nursing flow sheet  Drainage (amount, consistency, odor)  Periwound: Dressing procedure/placement/frequency: Cleanse LE wounds with saline, cover with single layer of xeroform, top with foam.  Low air loss mattress for moisture management and pressure redistribution Cleanse ischial wounds and sacral wound with Vashe Soila [phone]), pat dry. Pack with Dakins 1/4% moist gauze, top with ABD pads, secure with tape. Change BID  Clean left foot wound with saline, pat dry. Cover with foam WOC Nurse has reviewed record and this patient has a positive xray or MRI for osteomyelitis, this is considered outside of the scope of practice for the WOC nurse Re-consult if only topical wound care needed after orthopedic or surgical evaluation I will address wounds with topical care needed within my scope pending surgical consultation.    Mansoor Hillyard [facility] MSN, RN, [place], CNS, MAINE [phone]

## 2024-06-16 NOTE — Consult Note (Signed)
 [facility]    Date of Admission:  06/15/2024     Reason for Consult: infected sacral decub    Referring Provider: Davia     Lines:  Peripheral iv's  Abx: 9/27-c vanc 9/27-c cefepime         Assessment: 78 yo male with transverse myelitis and hx gastrocutaneous fistula s/p resection of such, cad, dm2, obesity, chronic sacral decubitus ulcer of several years, melenoma, admitted 9/27 for wife concerning of worsening discharge, with brief confusion at his assisted living facility (normalized on admission)  Presented with leukocytosis, without fever/hemodynamic instability. Ct pelvis showed sacral ulcer associated 4x6 cm left sided posterior superior thigh abscess. Imaging suggestion om Surgery consulted deferring I&D  9/27 admission bcx 1 of 4 bottles grow coagulase negative staph likely contaminant   As for other cases of irreversible cause, chronic decub ulcer associated om, long term treatment of OM is not beneficial  An abscess would likely needs drainage to shorten duration of abx exposure, which is ideal for these cases as high risk for development of mdro. Would recommend 2 weeks of abx tx, with continued wound care/off loading/nutrition support. If persistent or early relapse in setting of abscess, then would recommend abscess debridement at that time   Plan: Tomorrow if bcx remains the same, would change abx to doxycyline and augmentin  and finish 2 week course of treatment Continue outpatient wound care, offloading, maximizing nutrition support If relapse within the next 3 months, and abscess remains the same he'll benefit from abscess debridement/drainage Maintain standard isolation  Discussed with team     ------------------------------------------------ Principal Problem:   Severe sepsis secondary to acute OM of left ischium and posterior thigh abscess Active Problems:   Hyperlipidemia   Obstructive sleep apnea   Essential  hypertension   Coronary artery disease involving native coronary artery of native heart without angina pectoris   AKI (acute kidney injury)   Neurogenic bladder   Chronic osteomyelitis of pelvic region, right (HCC)   Normocytic anemia   Colostomy present (HCC)   DM type 2 (diabetes mellitus, type 2) (HCC)   (HFpEF) heart failure with preserved ejection fraction (HCC)   GERD (gastroesophageal reflux disease)   Thigh abscess   Acute osteomyelitis involving pelvic region and thigh, left (HCC)    HPI: Brent [name]. is a 78 y.o. male hx transverse myelitis, admitted from home for wife being concerned about sacral decub with more purulent discharge  Hx via patient, chart  He said he has been feeling fine and that he is at baseline health  He is not entirely clear about wound care but stated he has home health nurse  He doesn't really know why he is in the hospital  Chart mention wife concerned about purulent discharge from sacral wound  He has no fever, chill, focal discomfort  On presentation: Afebrile Wbc 24 and reactive thrombocytosis Alkphos mildly elevated Ct pelvis suggest a sacral abscess and om changes Surgery saw deferring I&D Started on vanc/cefepime  Bcx 1 of 4 bottles coNs  No complaint     Family History  Problem Relation Age of Onset   Lung cancer Mother    COPD Mother    Obesity Mother    Liver cancer Father    Lung cancer Father    Heart disease Father    Heart disease Sister    Colon cancer Neg Hx    Stomach cancer Neg Hx    Pancreatic  cancer Neg Hx    Esophageal cancer Neg Hx    Inflammatory bowel disease Neg Hx    Liver disease Neg Hx    Rectal cancer Neg Hx     Social History   Tobacco Use   Smoking status: Former    Current packs/day: 0.00    Average packs/day: 1.5 packs/day for 30.0 years (45.0 ttl pk-yrs)    Types: Cigarettes    Start date: 09/19/1950    Quit date: 09/19/1980    Years since quitting: 43.7    Passive exposure:  Never   Smokeless tobacco: Never  Vaping Use   Vaping status: Never Used  Substance Use Topics   Alcohol  use: Not Currently    Alcohol /week: 7.0 - 14.0 standard drinks of alcohol     Types: 7 - 14 Shots of liquor per week   Drug use: No    Allergies  Allergen Reactions   Cipro  [Ciprofloxacin  Hcl] Other (See Comments)    Hallucinations   Neurontin  [Gabapentin ] Other (See Comments)    Hallucinations   Levaquin  [Levofloxacin ] Rash   Penicillins Hives, Rash and Other (See Comments)    Tolerated Unasyn , Augmentin , cefepime  and cephalexin     Review of Systems: ROS All Other ROS was negative, except mentioned above   Past Medical History:  Diagnosis Date   Acute osteomyelitis of metatarsal bone of right foot (HCC) 07/01/2023   Allergy     Aortic atherosclerosis 07/01/2023   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 angioplasty of his RCA in 1990. b. bare metal stent placed in the RCA in 2000 followed by rotational atherectomy shortly after for stent restenosis. c. last cath was in 2012 showing stable moderate diffuse CAD. (70% mid LAD, 80% diagonal, 70% Ramus, 40% mid to distal RCA stent restenosis). d. Low risk nuc in 2015.   Diabetes mellitus    Diverticulosis    Elevated CK    Erectile dysfunction    Hemorrhoids    HTN (hypertension)    Hyperlipidemia    Hypertriglyceridemia    Malignant melanoma of left side of neck (HCC) 10/25/2018   Myocardial infarction (HCC)    Obesity    OSA (obstructive sleep apnea)    Persistent disorder of initiating or maintaining sleep    Personal history of colonic polyps 02/05/2003   Renal lesion 06/20/2016   Septic arthritis of foot (HCC) 07/01/2023   Stage IV decubitus ulcer (HCC) 06/30/2023       Scheduled Meds:  artificial tears  1 drop Both Eyes Daily   atorvastatin   40 mg Oral QHS   azelastine  2 spray Each Nare Daily   baclofen   5 mg Oral TID   Chlorhexidine  Gluconate Cloth  6  each Topical Daily   cholecalciferol   2,000 Units Oral Daily   DULoxetine   30 mg Oral QHS   enoxaparin  (LOVENOX ) injection  40 mg Subcutaneous QHS   famotidine   20 mg Oral BID   ferrous sulfate   325 mg Oral Q breakfast   fluticasone   1 spray Each Nare QHS   insulin  aspart  0-9 Units Subcutaneous TID WC   lamoTRIgine   50 mg Oral BID   loratadine   10 mg Oral Daily   mupirocin  ointment  1 Application Nasal BID   olopatadine   1 drop Both Eyes BID   senna-docusate  1 tablet Oral BID   traZODone   100 mg Oral QHS   Continuous Infusions:  ceFEPime  (MAXIPIME ) IV 2 g (06/16/24  9375)   vancomycin      PRN Meds:.acetaminophen  **OR** acetaminophen , albuterol , Muscle Rub, ondansetron  **OR** ondansetron  (ZOFRAN ) IV, oxyCODONE    OBJECTIVE: Blood pressure 109/70, pulse 80, temperature 98.3 F (36.8 C), temperature source Oral, resp. rate 20, height 6' (1.829 m), weight 68 kg, SpO2 98%.  Physical Exam  General/constitutional: no distress, pleasant HEENT: Normocephalic, PER, Conj Clear, EOMI, Oropharynx clear Neck supple CV: rrr no mrg Lungs: clear to auscultation, normal respiratory effort Abd: Soft, Nontender Ext: no edema Neuro: paraplegic MSK/skin: picture from 9/27; purulent discharge from sacral wound; he does have decub/maceration in bilateral leg and a small eschar on the left forefoot as well without cellulitis changes        Lab Results Lab Results  Component Value Date   WBC 17.2 (H) 06/16/2024   HGB 7.5 (L) 06/16/2024   HCT 26.9 (L) 06/16/2024   MCV 80.1 06/16/2024   PLT 384 06/16/2024    Lab Results  Component Value Date   CREATININE 0.97 06/16/2024   BUN 23 06/16/2024   NA 135 06/16/2024   K 3.7 06/16/2024   CL 105 06/16/2024   CO2 19 (L) 06/16/2024    Lab Results  Component Value Date   ALT 37 06/16/2024   AST 35 06/16/2024   ALKPHOS 159 (H) 06/16/2024   BILITOT 0.2 06/16/2024      Microbiology: Recent Results (from the past 240 hours)  Resp panel by  RT-PCR (RSV, Flu A&B, Covid) Anterior Nasal Swab     Status: None   Collection Time: 06/15/24 12:24 PM   Specimen: Anterior Nasal Swab  Result Value Ref Range Status   SARS Coronavirus 2 by RT PCR NEGATIVE NEGATIVE Final    Comment: (NOTE) SARS-CoV-2 target nucleic acids are NOT DETECTED.  The SARS-CoV-2 RNA is generally detectable in upper respiratory specimens during the acute phase of infection. The lowest concentration of SARS-CoV-2 viral copies this assay can detect is 138 copies/mL. A negative result does not preclude SARS-Cov-2 infection and should not be used as the sole basis for treatment or other patient management decisions. A negative result may occur with  improper specimen collection/handling, submission of specimen other than nasopharyngeal swab, presence of viral mutation(s) within the areas targeted by this assay, and inadequate number of viral copies(<138 copies/mL). A negative result must be combined with clinical observations, patient history, and epidemiological information. The expected result is Negative.  Fact Sheet for Patients:  BloggerCourse.com  Fact Sheet for Healthcare Providers:  SeriousBroker.it  This test is no t yet approved or cleared by the [place]  FDA and  has been authorized for detection and/or diagnosis of SARS-CoV-2 by FDA under an Emergency Use Authorization (EUA). This EUA will remain  in effect (meaning this test can be used) for the duration of the COVID-19 declaration under Section 564(b)(1) of the Act, 21 U.S.C.section 360bbb-3(b)(1), unless the authorization is terminated  or revoked sooner.       Influenza A by PCR NEGATIVE NEGATIVE Final   Influenza B by PCR NEGATIVE NEGATIVE Final    Comment: (NOTE) The Xpert Xpress SARS-CoV-2/FLU/RSV plus assay is intended as an aid in the diagnosis of influenza from Nasopharyngeal swab specimens and should not be used as a sole basis  for treatment. Nasal washings and aspirates are unacceptable for Xpert Xpress SARS-CoV-2/FLU/RSV testing.  Fact Sheet for Patients: BloggerCourse.com  Fact Sheet for Healthcare Providers: SeriousBroker.it  This test is not yet approved or cleared by the [place]  FDA and has been authorized for  detection and/or diagnosis of SARS-CoV-2 by FDA under an Emergency Use Authorization (EUA). This EUA will remain in effect (meaning this test can be used) for the duration of the COVID-19 declaration under Section 564(b)(1) of the Act, 21 U.S.C. section 360bbb-3(b)(1), unless the authorization is terminated or revoked.     Resp Syncytial Virus by PCR NEGATIVE NEGATIVE Final    Comment: (NOTE) Fact Sheet for Patients: BloggerCourse.com  Fact Sheet for Healthcare Providers: SeriousBroker.it  This test is not yet approved or cleared by the [place]  FDA and has been authorized for detection and/or diagnosis of SARS-CoV-2 by FDA under an Emergency Use Authorization (EUA). This EUA will remain in effect (meaning this test can be used) for the duration of the COVID-19 declaration under Section 564(b)(1) of the Act, 21 U.S.C. section 360bbb-3(b)(1), unless the authorization is terminated or revoked.  Performed at [facility], [address] [place], [place] [address]   Blood Culture (routine x 2)     Status: None (Preliminary result)   Collection Time: 06/15/24 12:26 PM   Specimen: BLOOD LEFT FOREARM  Result Value Ref Range Status   Specimen Description   Final    BLOOD LEFT FOREARM Performed at [facility], [address], [place], [place] [address]    Special Requests   Final    BOTTLES DRAWN AEROBIC AND ANAEROBIC Blood Culture adequate volume Performed at [facility], [address], [place], [place] [address]    Culture PENDING   Incomplete   Report Status PENDING  Incomplete  Blood Culture (routine x 2)     Status: None (Preliminary result)   Collection Time: 06/15/24 12:27 PM   Specimen: BLOOD RIGHT FOREARM  Result Value Ref Range Status   Specimen Description   Final    BLOOD RIGHT FOREARM Performed at [facility], [address] [place], [place] [address]    Special Requests   Final    BOTTLES DRAWN AEROBIC AND ANAEROBIC Blood Culture adequate volume Performed at [facility], [address] [place], [place] [address]    Culture  Setup Time   Final    Organism ID to follow GRAM POSITIVE COCCI ANAEROBIC BOTTLE ONLY CRITICAL RESULT CALLED TO, READ BACK BY AND VERIFIED WITH: LEANNE POINDEXTER ON 06/16/24 AT 9370 QSD Performed at [facility], [address] [place], [place] [address]    Culture [facility] POSITIVE COCCI  Final   Report Status PENDING  Incomplete  Blood Culture ID Panel (Reflexed)     Status: Abnormal   Collection Time: 06/15/24 12:27 PM  Result Value Ref Range Status   Enterococcus faecalis NOT DETECTED NOT DETECTED Final   Enterococcus Faecium NOT DETECTED NOT DETECTED Final   Listeria monocytogenes NOT DETECTED NOT DETECTED Final   Staphylococcus species DETECTED (A) NOT DETECTED Final    Comment: CRITICAL RESULT CALLED TO, READ BACK BY AND VERIFIED WITH: LEANNE POINDEXTER ON 06/16/24 AT 9370 QSD    Staphylococcus aureus (BCID) NOT DETECTED NOT DETECTED Final   Staphylococcus epidermidis NOT DETECTED NOT DETECTED Final   Staphylococcus lugdunensis NOT DETECTED NOT DETECTED Final   Streptococcus species NOT DETECTED NOT DETECTED Final   Streptococcus agalactiae NOT DETECTED NOT DETECTED Final   Streptococcus pneumoniae NOT DETECTED NOT DETECTED Final   Streptococcus pyogenes NOT DETECTED NOT DETECTED Final   A.calcoaceticus-baumannii NOT DETECTED NOT DETECTED Final   Bacteroides fragilis NOT DETECTED NOT DETECTED Final   Enterobacterales NOT DETECTED  NOT DETECTED  Final   Enterobacter cloacae complex NOT DETECTED NOT DETECTED Final   Escherichia coli NOT DETECTED NOT DETECTED Final   Klebsiella aerogenes NOT DETECTED NOT DETECTED Final   Klebsiella oxytoca NOT DETECTED NOT DETECTED Final   Klebsiella pneumoniae NOT DETECTED NOT DETECTED Final   Proteus species NOT DETECTED NOT DETECTED Final   Salmonella species NOT DETECTED NOT DETECTED Final   Serratia marcescens NOT DETECTED NOT DETECTED Final   Haemophilus influenzae NOT DETECTED NOT DETECTED Final   Neisseria meningitidis NOT DETECTED NOT DETECTED Final   Pseudomonas aeruginosa NOT DETECTED NOT DETECTED Final   Stenotrophomonas maltophilia NOT DETECTED NOT DETECTED Final   Candida albicans NOT DETECTED NOT DETECTED Final   Candida auris NOT DETECTED NOT DETECTED Final   Candida glabrata NOT DETECTED NOT DETECTED Final   Candida krusei NOT DETECTED NOT DETECTED Final   Candida parapsilosis NOT DETECTED NOT DETECTED Final   Candida tropicalis NOT DETECTED NOT DETECTED Final   Cryptococcus neoformans/gattii NOT DETECTED NOT DETECTED Final    Comment: Performed at [facility], [address] [place], [place] [address]  MRSA Next Gen by PCR, Nasal     Status: Abnormal   Collection Time: 06/16/24  4:42 AM   Specimen: Nasal Mucosa; Nasal Swab  Result Value Ref Range Status   MRSA by PCR Next Gen DETECTED (A) NOT DETECTED Final    Comment: Results Called to: [employer], J RN AT 1107 ON 06/16/2024 BY PRUDY, K (NOTE) The GeneXpert MRSA Assay (FDA approved for NASAL specimens only), is one component of a comprehensive MRSA colonization surveillance program. It is not intended to diagnose MRSA infection nor to guide or monitor treatment for MRSA infections. Test performance is not FDA approved in patients less than 3 years old. Performed at [facility], [address] [place], [place] [address]      Serology:    Imaging: If present, new imagings  (plain films, ct scans, and mri) have been personally visualized and interpreted; radiology reports have been reviewed. Decision making incorporated into the Impression / Recommendations.  9/27 ct abd pelv with contrast 1. Large decubitus ulcers overlying the sacrum and bilateral ischial tuberosities as above. Findings compatible with acute osteomyelitis within the left ischium, and chronic osteomyelitis within the right ischium. 2. New 4 x 6 cm complex fluid collection within the subcutaneous tissues posterior left thigh, immediately inferior to the left ischial decubitus ulcer, consistent with abscess. 3. Hepatic steatosis. 4. Borderline splenomegaly. 5. Distal colonic diverticulosis without diverticulitis. Stable loop colostomy left lower quadrant. 6. Stable indwelling suprapubic catheter.    9/27 cxr No acute abnormality.     Constance ONEIDA Passer, MD [facility] [phone] pager    06/16/2024, 12:01 PM

## 2024-06-16 NOTE — Progress Notes (Addendum)
 [facility]                                                                              Brent [name], is a 78 y.o. male, DOB - [birth year], FMW:[id number] Admit date - 06/15/2024    Outpatient Primary MD for the patient is Brent [name] LABOR, MD  LOS - 1  days  Chief Complaint  Patient presents with   Fatigue       Brief summary   Patient is a 78 year old male with CAD  s/p DES, T2DM, HTN, HLD, hx of melanoma, OSA, transverse myelitis, stage IV sacral decub ulcer and hx of chronic OM, colostomy and suprapubic catheter who presented to ED with concerns of worsening decubitus ulcer and complaints of fatigue. He has states he has had stage IV ulcers x 4 years and staff thought it was looking worse so brought him to ED. Now has purulent drainage. He states he was out of it at [facility] and was confused as well, but doing much better and back to his baseline mentally.    Wife also reports he has had poor PO intake over the past few weeks with nausea. He has not felt good for this time as well.  CT abdomen/pelvis:  1. Large decubitus ulcers overlying the sacrum and bilateral ischial tuberosities as above. Findings compatible with acute osteomyelitis within the left ischium, and chronic osteomyelitis within the right ischium. 2. New 4 x 6 cm complex fluid collection within the subcutaneous tissues posterior left thigh, immediately inferior to the left ischial decubitus ulcer, consistent with abscess.  Assessment & Plan   Severe sepsis secondary to acute osteomyelitis of left ischium and posterior thigh abscess -Presented with worsening stage IV decubitus ulcer with purulent drainage, altered mental status, leukocytosis 23.9, tachypnea, hypotension, AKI - CT abdomen pelvis showed acute osteomyelitis in the left ischium and chronic osteomyelitis in the right ischium.  New 4x 6 cm complex fluid collection in the subcutaneous tissue posterior left thigh immediately inferior  to the left ischial decub ulcer consistent with abscess  - Placed on IV fluid hydration, IV vancomycin , cefepime  -General Surgery consulted, seen by Dr. Ebbie, did not feel surgery needed, recommended twice daily dressing changes and wound care and antibiotics -BC ID + staph, will consult infectious disease    AKI (acute kidney injury) -Creatinine baseline 0.8 on 08/07/2023.  Presented with creatinine of 1.41  - Received IV fluids, creatinine now normalized.  UTI -Follow urine culture and sensitivities,  -Continue IV cefepime    DM type 2 (diabetes mellitus, type 2) (HCC) -Hold metformin , - Hemoglobin A1c 7.1  CBG (last 3)  Recent Labs    06/15/24 1704 06/15/24 2223 06/16/24 0735  GLUCAP 118* 118* 88   Continue sliding scale insulin  while inpatient, had poor p.o. intake in the last few weeks    Chronic diastolic (HFpEF) heart failure with preserved ejection fraction (HCC) Echo in 06/2023 with normal EF and normal diastolic function.small pericardial effusion -Hold Lasix  today, poor p.o. intake in the last few days, received IV fluids in ED   Essential hypertension -Borderline BP, continue to hold Lasix , antihypertensives  Coronary artery disease involving native coronary artery of native heart without angina pectoris Stable, continue medical management    Normocytic anemia ? ACD with chronic OM Baseline hgb around 9-10 - Hemoglobin 7.5, -Anemia panel showed percent saturation ratio 6, iron  10, ferritin 198 - Transfuse if Hb less than 7   Neurogenic bladder Suprapubic catheter, continue foley care    Hyperlipidemia Continue lipitor 40mg  daily    GERD (gastroesophageal reflux disease) Continue pepcid  BID    Colostomy present (HCC) Colostomy care    Chronic osteomyelitis of pelvic region, right [facility]) Seen by ID in April of 2025      Obstructive sleep apnea States he no longer uses cpap at night Unsure who told him to stop using      Pressure  Injury Documentation: Wound 06/15/24 2300 Pressure Injury Buttocks Right;Left;Lower;Mid;Upper Stage 4 - Full thickness tissue loss with exposed bone, tendon or muscle. (Active)     Wound 06/15/24 2300 Pressure Injury Thigh Left;Posterior;Proximal (Active)  Wound care consulted  Estimated body mass index is 20.34 kg/m as calculated from the following:   Height as of this encounter: 6' (1.829 m).   Weight as of this encounter: 68 kg.  Code Status: Full CODE STATUS DVT Prophylaxis:  enoxaparin  (LOVENOX ) injection 40 mg Start: 06/15/24 2200   Level of Care: Level of care: Progressive Family Communication:  Disposition Plan:      Remains inpatient appropriate:      Procedures:    Consultants:   General Surgery Infectious disease  Antimicrobials:   Anti-infectives (From admission, onward)    Start     Dose/Rate Route Frequency Ordered Stop   06/16/24 1500  vancomycin  (VANCOCIN ) IVPB 1000 mg/200 mL premix        1,000 mg 200 mL/hr over 60 Minutes Intravenous Every 24 hours 06/15/24 1731     06/16/24 0600  ceFEPIme  (MAXIPIME ) 2 g in sodium chloride  0.9 % 100 mL IVPB        2 g 200 mL/hr over 30 Minutes Intravenous Every 12 hours 06/15/24 1730     06/15/24 1330  vancomycin  (VANCOCIN ) IVPB 1000 mg/200 mL premix        1,000 mg 200 mL/hr over 60 Minutes Intravenous  Once 06/15/24 1329 06/15/24 1601   06/15/24 1330  ceFEPIme  (MAXIPIME ) 2 g in sodium chloride  0.9 % 100 mL IVPB        2 g 200 mL/hr over 30 Minutes Intravenous  Once 06/15/24 1329 06/15/24 1825   06/15/24 1300  cefTRIAXone  (ROCEPHIN ) 1 g in sodium chloride  0.9 % 100 mL IVPB  Status:  Discontinued        1 g 200 mL/hr over 30 Minutes Intravenous  Once 06/15/24 1259 06/15/24 1340          Medications  artificial tears  1 drop Both Eyes Daily   atorvastatin   40 mg Oral QHS   azelastine  2 spray Each Nare Daily   baclofen   5 mg Oral TID   cholecalciferol   2,000 Units Oral Daily   DULoxetine   30 mg Oral QHS    enoxaparin  (LOVENOX ) injection  40 mg Subcutaneous QHS   famotidine   20 mg Oral BID   ferrous sulfate   325 mg Oral Q breakfast   fluticasone   1 spray Each Nare QHS   insulin  aspart  0-9 Units Subcutaneous TID WC   lamoTRIgine   50 mg Oral BID   loratadine   10 mg Oral Daily   olopatadine   1 drop Both Eyes BID  senna-docusate  1 tablet Oral BID   traZODone   100 mg Oral QHS      Subjective:   Brent [name] was seen and examined today.  No acute issues overnight.  No fever chills, chest pain or shortness of breath.  Alert and awake, no active nausea vomiting abdominal pain  Objective:   Vitals:   06/15/24 2210 06/16/24 0301 06/16/24 0631 06/16/24 1052  BP: (!) 108/50 (!) 108/52 (!) 107/51 109/70  Pulse: 88 79 79 80  Resp: 16 14  20   Temp: 98.7 F (37.1 C) 99.2 F (37.3 C) 98.3 F (36.8 C) 98.3 F (36.8 C)  TempSrc:  Oral Oral Oral  SpO2: 100% 96% 98% 98%  Weight:      Height:        Intake/Output Summary (Last 24 hours) at 06/16/2024 1102 Last data filed at 06/16/2024 1011 Gross per 24 hour  Intake 3126.53 ml  Output 1550 ml  Net 1576.53 ml     Wt Readings from Last 3 Encounters:  06/15/24 68 kg  12/21/23 89.5 kg  08/07/23 89.5 kg     Exam General: Alert and oriented x self, NAD, ill-appearing Cardiovascular: S1 S2 auscultated,  RRR Respiratory: Clear to auscultation bilaterally Gastrointestinal: Soft, nontender, nondistended, + bowel sounds Ext: no pedal edema bilaterally Neuro: No new deficits Skin: Dressing intact on sacral decub, bilateral lower extremity dressings intact Psych: Normal affect    LLE    RLE   Data Reviewed:  I have personally reviewed following labs    CBC Lab Results  Component Value Date   WBC 17.2 (H) 06/16/2024   RBC 3.36 (L) 06/16/2024   HGB 7.5 (L) 06/16/2024   HCT 26.9 (L) 06/16/2024   MCV 80.1 06/16/2024   MCH 22.3 (L) 06/16/2024   PLT 384 06/16/2024   MCHC 27.9 (L) 06/16/2024   RDW 17.6 (H) 06/16/2024    LYMPHSABS 2.1 06/15/2024   MONOABS 1.3 (H) 06/15/2024   EOSABS 0.0 06/15/2024   BASOSABS 0.1 06/15/2024     Last metabolic panel Lab Results  Component Value Date   NA 135 06/16/2024   K 3.7 06/16/2024   CL 105 06/16/2024   CO2 19 (L) 06/16/2024   BUN 23 06/16/2024   CREATININE 0.97 06/16/2024   GLUCOSE 105 (H) 06/16/2024   GFRNONAA >60 06/16/2024   GFRAA >60 06/10/2020   CALCIUM  8.4 (L) 06/16/2024   PHOS 3.0 08/07/2023   PROT 5.9 (L) 06/16/2024   ALBUMIN  2.3 (L) 06/16/2024   LABGLOB 2.0 04/11/2019   AGRATIO 2.3 (H) 04/11/2019   BILITOT 0.2 06/16/2024   ALKPHOS 159 (H) 06/16/2024   AST 35 06/16/2024   ALT 37 06/16/2024   ANIONGAP 11 06/16/2024    CBG (last 3)  Recent Labs    06/15/24 1704 06/15/24 2223 06/16/24 0735  GLUCAP 118* 118* 88      Coagulation Profile: Recent Labs  Lab 06/15/24 1224  INR 1.3*     Radiology Studies: I have personally reviewed the imaging studies  CT ABDOMEN PELVIS W CONTRAST Result Date: 06/15/2024 CLINICAL DATA:  Decubitus ulcer EXAM: CT ABDOMEN AND PELVIS WITH CONTRAST TECHNIQUE: Multidetector CT imaging of the abdomen and pelvis was performed using the standard protocol following bolus administration of intravenous contrast. RADIATION DOSE REDUCTION: This exam was performed according to the departmental dose-optimization program which includes automated exposure control, adjustment of the mA and/or kV according to patient size and/or use of iterative reconstruction technique. CONTRAST:  OMNIPAQUE  IOHEXOL  300 MG/ML  SOLN COMPARISON:  12/01/2023 common 07/30/2023 FINDINGS: Lower chest: Bibasilar scarring and fibrosis. No acute pleural or parenchymal lung disease. Hepatobiliary: Hepatic steatosis. No focal liver abnormality. Prior cholecystectomy. Pancreas: Unremarkable. No pancreatic ductal dilatation or surrounding inflammatory changes. Spleen: Borderline splenomegaly, measuring 12.8 x 7.1 x 11.8 cm. No focal parenchymal  abnormality. Adrenals/Urinary Tract: The adrenals are unremarkable. Coarse parenchymal calcifications are identified within the upper pole left kidney. There also calcifications dependent within a peripelvic upper pole left renal cyst. Other simple cysts are seen within the bilateral kidneys. No specific imaging follow-up is recommended. No obstructive uropathy within either kidney. The bladder is decompressed with a suprapubic catheter, limiting its evaluation. Stomach/Bowel: No bowel obstruction or ileus. There is a loop colostomy within the left lower quadrant. Normal appendix right lower quadrant. Distal colonic diverticulosis without diverticulitis. There is a large amount of retained stool within the rectal wall, unchanged since prior exam. Vascular/Lymphatic: Aortic atherosclerosis. No enlarged abdominal or pelvic lymph nodes. Reproductive: Prostate is unremarkable. Other: No free fluid or free intraperitoneal gas. No abdominal wall hernia. Musculoskeletal: Large decubitus ulcers are again seen overlying the bilateral ischial tuberosities. Cortical destruction on the left consistent with osteomyelitis is identified on prior MRI. Stable sclerotic changes of the right ischium likely reflect chronic osteomyelitis. On the left, new since prior studies, there is a 4.0 x 6.0 cm complex fluid collection within the posterior upper thigh just inferior to the decubitus ulcer, consistent with abscess. There is also a large sacral decubitus ulcer, though I do not see any bony destruction along the sacrum to suggest osteomyelitis at this time. Reconstructed images demonstrate no additional findings. IMPRESSION: 1. Large decubitus ulcers overlying the sacrum and bilateral ischial tuberosities as above. Findings compatible with acute osteomyelitis within the left ischium, and chronic osteomyelitis within the right ischium. 2. New 4 x 6 cm complex fluid collection within the subcutaneous tissues posterior left thigh,  immediately inferior to the left ischial decubitus ulcer, consistent with abscess. 3. Hepatic steatosis. 4. Borderline splenomegaly. 5. Distal colonic diverticulosis without diverticulitis. Stable loop colostomy left lower quadrant. 6. Stable indwelling suprapubic catheter. Electronically Signed   By: Ozell Daring M.D.   On: 06/15/2024 14:37   DG Chest Port 1 View Result Date: 06/15/2024 CLINICAL DATA:  Sacral and bilateral leg wounds. Clinical concern for sepsis. EXAM: PORTABLE CHEST 1 VIEW COMPARISON:  08/06/2023.  Chest CTA dated 07/19/2023. FINDINGS: Stable borderline enlarged cardiac silhouette. Clear lungs with normal vascularity. Diffuse osteopenia. IMPRESSION: No acute abnormality. Electronically Signed   By: Elspeth Bathe M.D.   On: 06/15/2024 12:06       Ustin Cruickshank M.D. [facility] 06/16/2024, 11:02 AM  Available via Epic secure chat 7am-7pm After 7 pm, please refer to night coverage provider listed on amion.

## 2024-06-17 DIAGNOSIS — A419 Sepsis, unspecified organism: Secondary | ICD-10-CM | POA: Diagnosis not present

## 2024-06-17 DIAGNOSIS — R6510 Systemic inflammatory response syndrome (SIRS) of non-infectious origin without acute organ dysfunction: Secondary | ICD-10-CM | POA: Diagnosis not present

## 2024-06-17 DIAGNOSIS — N179 Acute kidney failure, unspecified: Secondary | ICD-10-CM | POA: Diagnosis not present

## 2024-06-17 DIAGNOSIS — L89154 Pressure ulcer of sacral region, stage 4: Secondary | ICD-10-CM | POA: Diagnosis not present

## 2024-06-17 LAB — GLUCOSE, CAPILLARY
Glucose-Capillary: 192 mg/dL — ABNORMAL HIGH (ref 70–99)
Glucose-Capillary: 185 mg/dL — ABNORMAL HIGH (ref 70–99)
Glucose-Capillary: 227 mg/dL — ABNORMAL HIGH (ref 70–99)
Glucose-Capillary: 282 mg/dL — ABNORMAL HIGH (ref 70–99)

## 2024-06-17 MED ORDER — INFLUENZA VAC SPLIT HIGH-DOSE 0.5 ML IM SUSY
0.5000 mL | PREFILLED_SYRINGE | INTRAMUSCULAR | Status: AC
  Administered 2024-06-18: 0.5 mL via INTRAMUSCULAR
  Filled 2024-06-17: qty 0.5

## 2024-06-17 MED ORDER — DAKINS (1/4 STRENGTH) 0.125 % EX SOLN
Freq: Two times a day (BID) | CUTANEOUS | Status: AC
  Filled 2024-06-17: qty 473

## 2024-06-17 MED ORDER — INSULIN ASPART 100 UNIT/ML IJ SOLN
0.0000 [IU] | Freq: Every day | INTRAMUSCULAR | Status: DC
  Administered 2024-06-17 – 2024-06-20 (×3): 3 [IU] via SUBCUTANEOUS

## 2024-06-17 MED ORDER — INSULIN ASPART 100 UNIT/ML IJ SOLN
0.0000 [IU] | Freq: Three times a day (TID) | INTRAMUSCULAR | Status: DC
  Administered 2024-06-17 – 2024-06-18 (×2): 5 [IU] via SUBCUTANEOUS
  Administered 2024-06-18: 8 [IU] via SUBCUTANEOUS
  Administered 2024-06-20: 3 [IU] via SUBCUTANEOUS
  Administered 2024-06-20: 5 [IU] via SUBCUTANEOUS
  Administered 2024-06-20: 11 [IU] via SUBCUTANEOUS
  Administered 2024-06-21 (×3): 3 [IU] via SUBCUTANEOUS
  Administered 2024-06-22: 2 [IU] via SUBCUTANEOUS
  Administered 2024-06-22: 3 [IU] via SUBCUTANEOUS
  Administered 2024-06-22: 5 [IU] via SUBCUTANEOUS
  Administered 2024-06-23: 3 [IU] via SUBCUTANEOUS
  Administered 2024-06-24 (×2): 2 [IU] via SUBCUTANEOUS

## 2024-06-17 NOTE — Progress Notes (Signed)
 Progress Note     Subjective: No complaints. Sacral and ischial wounds evaluated at bedside with nursing assistance.  Objective: Vital signs in last 24 hours: Temp:  [97.7 F (36.5 C)-98.7 F (37.1 C)] 97.7 F (36.5 C) (09/29 0530) Pulse Rate:  [69-93] 69 (09/29 0530) BP: (99-113)/(43-57) 99/57 (09/29 0530) SpO2:  [97 %-99 %] 98 % (09/29 0530) Last BM Date : 06/16/24  Intake/Output from previous day: 09/28 0701 - 09/29 0700 In: 950 [P.O.:600; IV Piggyback:350] Out: 425 [Urine:425] Intake/Output this shift: No intake/output data recorded.  PE: General: pleasant, WD, chronically ill appearing male who is laying in bed in NAD Heart: regular, rate, and rhythm.   Lungs: Respiratory effort nonlabored Abd: soft, NT, ND, SP tube and colostomy present  Skin: sacral and ischial wounds as depicted below; sacrum with more chronic skin changes but largely clean, left ischial wound with purulent drainage and small tract that extends 8 cm inferiorly, no external cellulitis of left posterior thigh        Lab Results:  Recent Labs    06/15/24 1224 06/16/24 0438  WBC 23.9* 17.2*  HGB 8.6* 7.5*  HCT 28.8* 26.9*  PLT 450* 384   BMET Recent Labs    06/15/24 1224 06/16/24 0438  NA 131* 135  K 4.2 3.7  CL 99 105  CO2 20* 19*  GLUCOSE 153* 105*  BUN 32* 23  CREATININE 1.41* 0.97  CALCIUM  8.9 8.4*   PT/INR Recent Labs    06/15/24 1224  LABPROT 17.0*  INR 1.3*   CMP     Component Value Date/Time   NA 135 06/16/2024 0438   NA 140 04/11/2019 1028   K 3.7 06/16/2024 0438   CL 105 06/16/2024 0438   CO2 19 (L) 06/16/2024 0438   GLUCOSE 105 (H) 06/16/2024 0438   BUN 23 06/16/2024 0438   BUN 21 04/11/2019 1028   CREATININE 0.97 06/16/2024 0438   CREATININE 0.26 (L) 07/23/2020 1052   CALCIUM  8.4 (L) 06/16/2024 0438   PROT 5.9 (L) 06/16/2024 0438   PROT 6.6 01/20/2020 0754   ALBUMIN  2.3 (L) 06/16/2024 0438   ALBUMIN  4.2 03/10/2020 1757   AST 35 06/16/2024 0438    ALT 37 06/16/2024 0438   ALKPHOS 159 (H) 06/16/2024 0438   BILITOT 0.2 06/16/2024 0438   BILITOT 0.3 01/20/2020 0754   GFRNONAA >60 06/16/2024 0438   GFRNONAA 87 04/15/2016 1133   GFRAA >60 06/10/2020 0431   GFRAA >89 04/15/2016 1133   Lipase     Component Value Date/Time   LIPASE 36 06/05/2023 1226       Studies/Results: CT ABDOMEN PELVIS W CONTRAST Result Date: 06/15/2024 CLINICAL DATA:  Decubitus ulcer EXAM: CT ABDOMEN AND PELVIS WITH CONTRAST TECHNIQUE: Multidetector CT imaging of the abdomen and pelvis was performed using the standard protocol following bolus administration of intravenous contrast. RADIATION DOSE REDUCTION: This exam was performed according to the departmental dose-optimization program which includes automated exposure control, adjustment of the mA and/or kV according to patient size and/or use of iterative reconstruction technique. CONTRAST:  OMNIPAQUE  IOHEXOL  300 MG/ML  SOLN COMPARISON:  12/01/2023 common 07/30/2023 FINDINGS: Lower chest: Bibasilar scarring and fibrosis. No acute pleural or parenchymal lung disease. Hepatobiliary: Hepatic steatosis. No focal liver abnormality. Prior cholecystectomy. Pancreas: Unremarkable. No pancreatic ductal dilatation or surrounding inflammatory changes. Spleen: Borderline splenomegaly, measuring 12.8 x 7.1 x 11.8 cm. No focal parenchymal abnormality. Adrenals/Urinary Tract: The adrenals are unremarkable. Coarse parenchymal calcifications are identified within the upper pole  left kidney. There also calcifications dependent within a peripelvic upper pole left renal cyst. Other simple cysts are seen within the bilateral kidneys. No specific imaging follow-up is recommended. No obstructive uropathy within either kidney. The bladder is decompressed with a suprapubic catheter, limiting its evaluation. Stomach/Bowel: No bowel obstruction or ileus. There is a loop colostomy within the left lower quadrant. Normal appendix right lower  quadrant. Distal colonic diverticulosis without diverticulitis. There is a large amount of retained stool within the rectal wall, unchanged since prior exam. Vascular/Lymphatic: Aortic atherosclerosis. No enlarged abdominal or pelvic lymph nodes. Reproductive: Prostate is unremarkable. Other: No free fluid or free intraperitoneal gas. No abdominal wall hernia. Musculoskeletal: Large decubitus ulcers are again seen overlying the bilateral ischial tuberosities. Cortical destruction on the left consistent with osteomyelitis is identified on prior MRI. Stable sclerotic changes of the right ischium likely reflect chronic osteomyelitis. On the left, new since prior studies, there is a 4.0 x 6.0 cm complex fluid collection within the posterior upper thigh just inferior to the decubitus ulcer, consistent with abscess. There is also a large sacral decubitus ulcer, though I do not see any bony destruction along the sacrum to suggest osteomyelitis at this time. Reconstructed images demonstrate no additional findings. IMPRESSION: 1. Large decubitus ulcers overlying the sacrum and bilateral ischial tuberosities as above. Findings compatible with acute osteomyelitis within the left ischium, and chronic osteomyelitis within the right ischium. 2. New 4 x 6 cm complex fluid collection within the subcutaneous tissues posterior left thigh, immediately inferior to the left ischial decubitus ulcer, consistent with abscess. 3. Hepatic steatosis. 4. Borderline splenomegaly. 5. Distal colonic diverticulosis without diverticulitis. Stable loop colostomy left lower quadrant. 6. Stable indwelling suprapubic catheter. Electronically Signed   By: Ozell Daring M.D.   On: 06/15/2024 14:37   DG Chest Port 1 View Result Date: 06/15/2024 CLINICAL DATA:  Sacral and bilateral leg wounds. Clinical concern for sepsis. EXAM: PORTABLE CHEST 1 VIEW COMPARISON:  08/06/2023.  Chest CTA dated 07/19/2023. FINDINGS: Stable borderline enlarged cardiac  silhouette. Clear lungs with normal vascularity. Diffuse osteopenia. IMPRESSION: No acute abnormality. Electronically Signed   By: Elspeth Bathe M.D.   On: 06/15/2024 12:06    Anti-infectives: Anti-infectives (From admission, onward)    Start     Dose/Rate Route Frequency Ordered Stop   06/16/24 2000  ceFEPIme  (MAXIPIME ) 2 g in sodium chloride  0.9 % 100 mL IVPB        2 g 200 mL/hr over 30 Minutes Intravenous Every 12 hours 06/16/24 1408     06/16/24 1500  vancomycin  (VANCOCIN ) IVPB 1000 mg/200 mL premix  Status:  Discontinued        1,000 mg 200 mL/hr over 60 Minutes Intravenous Every 24 hours 06/15/24 1731 06/16/24 1353   06/16/24 1500  vancomycin  (VANCOREADY) IVPB 1250 mg/250 mL        1,250 mg 166.7 mL/hr over 90 Minutes Intravenous Every 24 hours 06/16/24 1353     06/16/24 1400  ceFEPIme  (MAXIPIME ) 2 g in sodium chloride  0.9 % 100 mL IVPB  Status:  Discontinued        2 g 200 mL/hr over 30 Minutes Intravenous Every 8 hours 06/16/24 1353 06/16/24 1408   06/16/24 0600  ceFEPIme  (MAXIPIME ) 2 g in sodium chloride  0.9 % 100 mL IVPB  Status:  Discontinued        2 g 200 mL/hr over 30 Minutes Intravenous Every 12 hours 06/15/24 1730 06/16/24 1353   06/15/24 1330  vancomycin  (VANCOCIN ) IVPB 1000  mg/200 mL premix        1,000 mg 200 mL/hr over 60 Minutes Intravenous  Once 06/15/24 1329 06/15/24 1601   06/15/24 1330  ceFEPIme  (MAXIPIME ) 2 g in sodium chloride  0.9 % 100 mL IVPB        2 g 200 mL/hr over 30 Minutes Intravenous  Once 06/15/24 1329 06/15/24 1825   06/15/24 1300  cefTRIAXone  (ROCEPHIN ) 1 g in sodium chloride  0.9 % 100 mL IVPB  Status:  Discontinued        1 g 200 mL/hr over 30 Minutes Intravenous  Once 06/15/24 1259 06/15/24 1340        Assessment/Plan  Posterior left thigh abscess Chronic sacral wound and ischial wounds with osteomyelitis  - CT 9/27 with 4x6 complex collection in left posterior thigh, still having significant purulent drainage from this and not really  able to pack into tract - remainder of wound is chronic and fairly clean - WBC 17K from 23K afebrile and HD stable  - would recommend OR to open up further and wash out and allow for better wound care 9/30  FEN: HH/CM diet, will make NPO after MN VTE: LMWH ID: [email or web address] /[email or web address]  - per TR[facility] -  CAD s/p DES T2DM HTN HLD Hx of melanoma OSA Transverse myelitis      LOS: 2 days   I reviewed Consultant ID notes, hospitalist notes, last 24 h vitals and pain scores, last 48 h intake and output, last 24 h labs and trends, and last 24 h imaging results.  This care required high  level of medical decision making.    Burnard JONELLE Louder, [facility] Surgery 06/17/2024, 11:31 AM Please see Amion for pager number during day hours 7:00am-4:30pm

## 2024-06-17 NOTE — TOC Initial Note (Addendum)
 Transition of Care (TOC) - Initial/Assessment Note    Patient Details  Name: Brent [name]. MRN: [id number] Date of Birth: 07/06/1946  Transition of Care [facility]) CM/SW Contact:    Bascom Service, RN Phone Number: 06/17/2024, 12:47 PM  Clinical Narrative: Confirmed from [facility]LTC per rep [place] for return. Left vm w/Jean(spouse).PTA  Sacrum/ischial wounds, supra pubic cath,colostomy. Continue to monitor for.                   Expected Discharge Plan: Long Term Nursing Home Barriers to Discharge: Continued Medical Work up   Patient Goals and CMS Choice Patient states their goals for this hospitalization and ongoing recovery are:: [facility] CMS Medicare.gov Compare Post Acute Care list provided to:: Patient Represenative (must comment) (Jean(spouse)) Choice offered to / list presented to : Spouse [place] ownership interest in [facility].provided to:: Spouse    Expected Discharge Plan and Services   Discharge Planning Services: CM Consult Post Acute Care Choice: Skilled Nursing Facility Living arrangements for the past 2 months: Skilled Nursing Facility                                      Prior Living Arrangements/Services Living arrangements for the past 2 months: Skilled Nursing Facility Lives with:: Facility Resident   Do you feel safe going back to the place where you live?: Yes               Activities of Daily Living   ADL Screening (condition at time of admission) Independently performs ADLs?: No Does the patient have a NEW difficulty with bathing/dressing/toileting/self-feeding that is expected to last >3 days?: No Does the patient have a NEW difficulty with getting in/out of bed, walking, or climbing stairs that is expected to last >3 days?: No Does the patient have a NEW difficulty with communication that is expected to last >3 days?: No Is the patient deaf or have difficulty hearing?: No Does the patient have difficulty  seeing, even when wearing glasses/contacts?: No Does the patient have difficulty concentrating, remembering, or making decisions?: No  Permission Sought/Granted Permission sought to share information with : Case Manager                Emotional Assessment              Admission diagnosis:  SIRS (systemic inflammatory response syndrome) (HCC) [R65.10] AKI (acute kidney injury) [N17.9] Thigh abscess [L02.419] Sepsis (HCC) [A41.9] Urinary tract infection without hematuria, site unspecified [N39.0] Pressure injury of sacral region, stage 4 (HCC) [L89.154] Patient Active Problem List   Diagnosis Date Noted   Thigh abscess 06/15/2024   Acute osteomyelitis involving pelvic region and thigh, left (HCC) 06/15/2024   Acute encephalopathy 07/31/2023   Acute respiratory failure (HCC) 07/31/2023   Bilateral pleural effusion 07/26/2023   Acute confusional state 07/26/2023   Pleural effusion 07/19/2023   Leukocytosis 07/19/2023   Sinus bradycardia 07/19/2023   (HFpEF) heart failure with preserved ejection fraction (HCC) 07/14/2023   History of osteomyelitis 07/14/2023   GERD (gastroesophageal reflux disease) 07/14/2023   Diabetic foot ulcer (HCC) 07/01/2023   Acute osteomyelitis of metatarsal bone of right foot (HCC) 07/01/2023   Septic arthritis of foot (HCC) 07/01/2023   Colostomy present (HCC) 07/01/2023   DM type 2 (diabetes mellitus, type 2) (HCC) 07/01/2023   Aortic atherosclerosis 07/01/2023   Pressure ulcer 07/01/2023   Stage IV  decubitus ulcer (HCC) 06/30/2023   Urine leukocytes 06/30/2023   SIRS (systemic inflammatory response syndrome) (HCC) 06/05/2023   Suprapubic catheter (HCC) 06/05/2023   Dehydration 02/21/2023   Severe sepsis secondary to acute OM of left ischium and posterior thigh abscess 02/21/2023   Acute metabolic encephalopathy 02/21/2023   Lactic acidosis 02/21/2023   Insulin  dependent type 2 diabetes mellitus (HCC) 02/21/2023   Normocytic anemia  02/21/2023   Primary malignant neuroendocrine tumor of stomach (HCC) 05/27/2022   Preoperative examination 05/27/2022   Abnormal endoscopy of upper gastrointestinal tract 05/27/2022   Benign neuroendocrine tumor of stomach  04/04/2022   Mucosal abnormality of stomach    Chronic osteomyelitis of pelvic region, right (HCC) 03/25/2022   External gastrointestinal fistula site infection 03/25/2022   Complicated urinary tract infection    Catheter-associated urinary tract infection 08/06/2021   Osteomyelitis of pelvic region, acute, right (HCC) 03/24/2021   Right ischial pressure sore 02/10/2021   Stage IV pressure ulcer of sacral region (HCC) 12/22/2020   Hypokalemia    Encounter for monitoring immunomodulating therapy 08/20/2020   High risk medication use 08/20/2020   Malnutrition of moderate degree 06/04/2020   Palliative care by specialist    Goals of care, counseling/discussion    FTT (failure to thrive) in adult 06/01/2020   Abdominal pain    Sacral ulcer, with necrosis of muscle (HCC) 04/09/2020   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HCC)    Transverse myelitis (HCC)    Labile blood pressure    Labile blood glucose    Neurogenic bladder    Neurogenic bowel    Transaminitis    Controlled type 2 diabetes mellitus with hyperglycemia, with long-term current use of insulin  (HCC)    Quadriplegia (HCC)    Dyslipidemia    Paraplegia (HCC)    Coronary artery disease involving native coronary artery of native heart without angina pectoris    Steroid-induced hyperglycemia    Diabetic peripheral neuropathy (HCC)    AKI (acute kidney injury)    Weakness 03/09/2020   Chest pain 03/08/2020   Right leg numbness    Malignant melanoma of left side of neck (HCC) 10/25/2018   Facial neuritis 10/25/2018   Myofascial pain syndrome 10/25/2018   Occipital neuralgia of left side 10/25/2018   Male hypogonadism 06/20/2016   Renal lesion 06/20/2016   Insomnia 08/07/2015   Enlarged prostate with lower urinary  tract symptoms (LUTS) 11/07/2014   Balanitis 07/30/2012   Bladder neck obstruction 07/30/2012   Prostate nodule 06/18/2012   Recurrent nephrolithiasis 06/18/2012   Benign neoplasm of colon 08/29/2011   DEPRESSION, SITUATIONAL, ACUTE 01/23/2010   Essential hypertension 05/30/2009   Coronary atherosclerosis 05/30/2009   Obstructive sleep apnea 11/28/2008   OBESITY 09/23/2008   ERECTILE DYSFUNCTION 11/26/2007   Well controlled type 2 diabetes mellitus with peripheral circulatory disorder (HCC) 05/09/2007   Hyperlipidemia 05/09/2007   MYOCARDIAL INFARCTION, HX OF 05/09/2007   DIVERTICULOSIS, COLON 05/09/2007   PCP:  Feliciano Devoria LABOR, MD Pharmacy:   [facility] [place], [place] - [address] [place] [address] [place] [address] Phone: [phone] Fax: [phone]     Social Drivers of Health (SDOH) Social History: SDOH Screenings   Food Insecurity: No Food Insecurity (06/15/2024)  Housing: Low Risk  (06/15/2024)  Transportation Needs: No Transportation Needs (06/15/2024)  Utilities: Not At Risk (06/15/2024)  Depression (PHQ2-9): Low Risk  (12/21/2023)  Financial Resource Strain: Low Risk  (03/06/2020)  Physical Activity: Inactive (12/14/2018)  Social Connections: Socially Isolated (06/15/2024)  Stress:  No Stress Concern Present (12/14/2018)  Tobacco Use: Medium Risk (06/16/2024)   SDOH Interventions:     Readmission Risk Interventions    07/31/2023    9:17 AM 07/02/2023   12:31 PM  Readmission Risk Prevention Plan  Transportation Screening Complete Complete  Medication Review (RN Care Manager) Complete Complete  PCP or Specialist appointment within 3-5 days of discharge Complete Not Complete  PCP/Specialist Appt Not Complete comments  SNF resident  HRI or Home Care Consult Complete Complete  SW Recovery Care/Counseling Consult Complete Complete  Palliative Care Screening Not Applicable Not Applicable  Skilled Nursing Facility Complete Complete

## 2024-06-17 NOTE — Progress Notes (Signed)
 [facility]                                                                              Brent [name], is a 78 y.o. male, DOB - Jul 01, 1946, FMW:[id number] Admit date - 06/15/2024    Outpatient Primary MD for the patient is Feliciano Devoria LABOR, MD  LOS - 2  days  Chief Complaint  Patient presents with   Fatigue       Brief summary   Patient is a 78 year old male with CAD  s/p DES, T2DM, HTN, HLD, hx of melanoma, OSA, transverse myelitis, stage IV sacral decub ulcer and hx of chronic OM, colostomy and suprapubic catheter who presented to ED with concerns of worsening decubitus ulcer and complaints of fatigue. He has states he has had stage IV ulcers x 4 years and staff thought it was looking worse so brought him to ED. Now has purulent drainage. He states he was out of it at [facility] and was confused as well, but doing much better and back to his baseline mentally.    Wife also reports he has had poor PO intake over the past few weeks with nausea. He has not felt good for this time as well.  CT abdomen/pelvis:  1. Large decubitus ulcers overlying the sacrum and bilateral ischial tuberosities as above. Findings compatible with acute osteomyelitis within the left ischium, and chronic osteomyelitis within the right ischium. 2. New 4 x 6 cm complex fluid collection within the subcutaneous tissues posterior left thigh, immediately inferior to the left ischial decubitus ulcer, consistent with abscess.  Assessment & Plan   Severe sepsis secondary to acute osteomyelitis of left ischium and posterior thigh abscess BCID staph -Presented with worsening stage IV decubitus ulcer with purulent drainage, altered mental status, leukocytosis 23.9, tachypnea, hypotension, AKI - CT abdomen pelvis showed acute osteomyelitis in the left ischium and chronic osteomyelitis in the right ischium.  New 4x 6 cm complex fluid collection in the subcutaneous tissue posterior left thigh  immediately inferior to the left ischial decub ulcer consistent with abscess  - Continue IV vancomycin , cefepime  - General Surgery following, plan for I&D tomorrow a.m., n.p.o. after midnight  - ID following    AKI (acute kidney injury) -Creatinine baseline 0.8 on 08/07/2023.  Presented with creatinine of 1.41  - Received IV fluids, creatinine now normalized.  UTI -Urine culture showed more than 100,000 colonies of Proteus mirabilis  -Continue IV cefepime    DM type 2 (diabetes mellitus, type 2) (HCC) -Hold metformin , - Hemoglobin A1c 7.1  CBG (last 3)  Recent Labs    06/16/24 2139 06/17/24 0741 06/17/24 1133  GLUCAP 168* 192* 185*   -Placed on sliding scale insulin , moderate    Chronic diastolic (HFpEF) heart failure with preserved ejection fraction (HCC) Echo in 06/2023 with normal EF and normal diastolic function.small pericardial effusion - Continue to hold Lasix  for now, poor p.o. intake in the last few days.     Essential hypertension -Borderline BP, continue to hold Lasix , antihypertensives    Coronary artery disease involving native coronary artery of native heart without angina pectoris Stable, continue medical management  Normocytic anemia ? ACD with chronic OM Baseline hgb around 9-10 - Hemoglobin 7.5, -Anemia panel showed percent saturation ratio 6, iron  10, ferritin 198 - Transfuse if Hb less than 7   Neurogenic bladder Suprapubic catheter, continue foley care    Hyperlipidemia Continue lipitor 40mg  daily    GERD (gastroesophageal reflux disease) Continue pepcid  BID    Colostomy present (HCC) Colostomy care    Chronic osteomyelitis of pelvic region, right [facility]) Seen by ID in April of 2025      Obstructive sleep apnea States he no longer uses cpap at night Unsure who told him to stop using      Pressure Injury Documentation: Wound 06/15/24 2300 Pressure Injury Buttocks Right;Left;Lower;Mid;Upper Stage 4 - Full thickness tissue loss with  exposed bone, tendon or muscle. (Active)     Wound 06/15/24 2300 Pressure Injury Thigh Left;Posterior;Proximal (Active)  Wound care consulted  Estimated body mass index is 20.34 kg/m as calculated from the following:   Height as of this encounter: 6' (1.829 m).   Weight as of this encounter: 68 kg.  Code Status: Full CODE STATUS DVT Prophylaxis:  enoxaparin  (LOVENOX ) injection 40 mg Start: 06/15/24 2200   Level of Care: Level of care: Progressive Family Communication:  Disposition Plan:      Remains inpatient appropriate:      Procedures:    Consultants:   General Surgery Infectious disease  Antimicrobials:   Anti-infectives (From admission, onward)    Start     Dose/Rate Route Frequency Ordered Stop   06/16/24 2000  ceFEPIme  (MAXIPIME ) 2 g in sodium chloride  0.9 % 100 mL IVPB        2 g 200 mL/hr over 30 Minutes Intravenous Every 12 hours 06/16/24 1408     06/16/24 1500  vancomycin  (VANCOCIN ) IVPB 1000 mg/200 mL premix  Status:  Discontinued        1,000 mg 200 mL/hr over 60 Minutes Intravenous Every 24 hours 06/15/24 1731 06/16/24 1353   06/16/24 1500  vancomycin  (VANCOREADY) IVPB 1250 mg/250 mL        1,250 mg 166.7 mL/hr over 90 Minutes Intravenous Every 24 hours 06/16/24 1353     06/16/24 1400  ceFEPIme  (MAXIPIME ) 2 g in sodium chloride  0.9 % 100 mL IVPB  Status:  Discontinued        2 g 200 mL/hr over 30 Minutes Intravenous Every 8 hours 06/16/24 1353 06/16/24 1408   06/16/24 0600  ceFEPIme  (MAXIPIME ) 2 g in sodium chloride  0.9 % 100 mL IVPB  Status:  Discontinued        2 g 200 mL/hr over 30 Minutes Intravenous Every 12 hours 06/15/24 1730 06/16/24 1353   06/15/24 1330  vancomycin  (VANCOCIN ) IVPB 1000 mg/200 mL premix        1,000 mg 200 mL/hr over 60 Minutes Intravenous  Once 06/15/24 1329 06/15/24 1601   06/15/24 1330  ceFEPIme  (MAXIPIME ) 2 g in sodium chloride  0.9 % 100 mL IVPB        2 g 200 mL/hr over 30 Minutes Intravenous  Once 06/15/24 1329 06/15/24  1825   06/15/24 1300  cefTRIAXone  (ROCEPHIN ) 1 g in sodium chloride  0.9 % 100 mL IVPB  Status:  Discontinued        1 g 200 mL/hr over 30 Minutes Intravenous  Once 06/15/24 1259 06/15/24 1340          Medications  artificial tears  1 drop Both Eyes Daily   atorvastatin   40 mg Oral QHS  azelastine  2 spray Each Nare Daily   baclofen   5 mg Oral TID   Chlorhexidine  Gluconate Cloth  6 each Topical Daily   cholecalciferol   2,000 Units Oral Daily   DULoxetine   30 mg Oral QHS   enoxaparin  (LOVENOX ) injection  40 mg Subcutaneous QHS   famotidine   20 mg Oral BID   ferrous sulfate   325 mg Oral Q breakfast   fluticasone   1 spray Each Nare QHS   [START ON 06/18/2024] Influenza vac split trivalent PF  0.5 mL Intramuscular Tomorrow-1000   insulin  aspart  0-9 Units Subcutaneous TID WC   lamoTRIgine   50 mg Oral BID   loratadine   10 mg Oral Daily   mupirocin  ointment  1 Application Nasal BID   olopatadine   1 drop Both Eyes BID   senna-docusate  1 tablet Oral BID   traZODone   100 mg Oral QHS      Subjective:   Brent [name] was seen and examined today.  No acute complaints, BP soft.  No fever chills chest pain or shortness of breath.  Alert and awake.   Objective:   Vitals:   06/16/24 1052 06/16/24 1320 06/16/24 1939 06/17/24 0530  BP: 109/70 (!) 113/43 (!) 106/48 (!) 99/57  Pulse: 80 93 92 69  Resp: 20     Temp: 98.3 F (36.8 C) 98.4 F (36.9 C) 98.7 F (37.1 C) 97.7 F (36.5 C)  TempSrc: Oral Oral    SpO2: 98% 99% 97% 98%  Weight:      Height:        Intake/Output Summary (Last 24 hours) at 06/17/2024 1418 Last data filed at 06/17/2024 1235 Gross per 24 hour  Intake 950 ml  Output 300 ml  Net 650 ml     Wt Readings from Last 3 Encounters:  06/15/24 68 kg  12/21/23 89.5 kg  08/07/23 89.5 kg   Physical Exam General: Alert and oriented x self, NAD Cardiovascular: S1 S2 clear, RRR.  Respiratory: CTAB, no wheezing, Gastrointestinal: Soft, nontender, nondistended,  NBS Ext: no pedal edema bilaterally Neuro: no new deficits Skin: Sacral decub, bilateral lower extremity dressings intact Psych: Somewhat lethargic       Data Reviewed:  I have personally reviewed following labs    CBC Lab Results  Component Value Date   WBC 17.2 (H) 06/16/2024   RBC 3.36 (L) 06/16/2024   HGB 7.5 (L) 06/16/2024   HCT 26.9 (L) 06/16/2024   MCV 80.1 06/16/2024   MCH 22.3 (L) 06/16/2024   PLT 384 06/16/2024   MCHC 27.9 (L) 06/16/2024   RDW 17.6 (H) 06/16/2024   LYMPHSABS 2.1 06/15/2024   MONOABS 1.3 (H) 06/15/2024   EOSABS 0.0 06/15/2024   BASOSABS 0.1 06/15/2024     Last metabolic panel Lab Results  Component Value Date   NA 135 06/16/2024   K 3.7 06/16/2024   CL 105 06/16/2024   CO2 19 (L) 06/16/2024   BUN 23 06/16/2024   CREATININE 0.97 06/16/2024   GLUCOSE 105 (H) 06/16/2024   GFRNONAA >60 06/16/2024   GFRAA >60 06/10/2020   CALCIUM  8.4 (L) 06/16/2024   PHOS 3.0 08/07/2023   PROT 5.9 (L) 06/16/2024   ALBUMIN  2.3 (L) 06/16/2024   LABGLOB 2.0 04/11/2019   AGRATIO 2.3 (H) 04/11/2019   BILITOT 0.2 06/16/2024   ALKPHOS 159 (H) 06/16/2024   AST 35 06/16/2024   ALT 37 06/16/2024   ANIONGAP 11 06/16/2024    CBG (last 3)  Recent Labs    06/16/24  2139 06/17/24 0741 06/17/24 1133  GLUCAP 168* 192* 185*      Coagulation Profile: Recent Labs  Lab 06/15/24 1224  INR 1.3*     Radiology Studies: I have personally reviewed the imaging studies  CT ABDOMEN PELVIS W CONTRAST Result Date: 06/15/2024 CLINICAL DATA:  Decubitus ulcer EXAM: CT ABDOMEN AND PELVIS WITH CONTRAST TECHNIQUE: Multidetector CT imaging of the abdomen and pelvis was performed using the standard protocol following bolus administration of intravenous contrast. RADIATION DOSE REDUCTION: This exam was performed according to the departmental dose-optimization program which includes automated exposure control, adjustment of the mA and/or kV according to patient size and/or use  of iterative reconstruction technique. CONTRAST:  OMNIPAQUE  IOHEXOL  300 MG/ML  SOLN COMPARISON:  12/01/2023 common 07/30/2023 FINDINGS: Lower chest: Bibasilar scarring and fibrosis. No acute pleural or parenchymal lung disease. Hepatobiliary: Hepatic steatosis. No focal liver abnormality. Prior cholecystectomy. Pancreas: Unremarkable. No pancreatic ductal dilatation or surrounding inflammatory changes. Spleen: Borderline splenomegaly, measuring 12.8 x 7.1 x 11.8 cm. No focal parenchymal abnormality. Adrenals/Urinary Tract: The adrenals are unremarkable. Coarse parenchymal calcifications are identified within the upper pole left kidney. There also calcifications dependent within a peripelvic upper pole left renal cyst. Other simple cysts are seen within the bilateral kidneys. No specific imaging follow-up is recommended. No obstructive uropathy within either kidney. The bladder is decompressed with a suprapubic catheter, limiting its evaluation. Stomach/Bowel: No bowel obstruction or ileus. There is a loop colostomy within the left lower quadrant. Normal appendix right lower quadrant. Distal colonic diverticulosis without diverticulitis. There is a large amount of retained stool within the rectal wall, unchanged since prior exam. Vascular/Lymphatic: Aortic atherosclerosis. No enlarged abdominal or pelvic lymph nodes. Reproductive: Prostate is unremarkable. Other: No free fluid or free intraperitoneal gas. No abdominal wall hernia. Musculoskeletal: Large decubitus ulcers are again seen overlying the bilateral ischial tuberosities. Cortical destruction on the left consistent with osteomyelitis is identified on prior MRI. Stable sclerotic changes of the right ischium likely reflect chronic osteomyelitis. On the left, new since prior studies, there is a 4.0 x 6.0 cm complex fluid collection within the posterior upper thigh just inferior to the decubitus ulcer, consistent with abscess. There is also a large sacral  decubitus ulcer, though I do not see any bony destruction along the sacrum to suggest osteomyelitis at this time. Reconstructed images demonstrate no additional findings. IMPRESSION: 1. Large decubitus ulcers overlying the sacrum and bilateral ischial tuberosities as above. Findings compatible with acute osteomyelitis within the left ischium, and chronic osteomyelitis within the right ischium. 2. New 4 x 6 cm complex fluid collection within the subcutaneous tissues posterior left thigh, immediately inferior to the left ischial decubitus ulcer, consistent with abscess. 3. Hepatic steatosis. 4. Borderline splenomegaly. 5. Distal colonic diverticulosis without diverticulitis. Stable loop colostomy left lower quadrant. 6. Stable indwelling suprapubic catheter. Electronically Signed   By: Ozell Daring M.D.   On: 06/15/2024 14:37       Lizandro Spellman M.D. [facility] 06/17/2024, 2:18 PM  Available via Epic secure chat 7am-7pm After 7 pm, please refer to night coverage provider listed on amion.

## 2024-06-17 NOTE — Plan of Care (Signed)
  Problem: Education: Goal: Ability to describe self-care measures that may prevent or decrease complications (Diabetes Survival Skills Education) will improve Outcome: Not Progressing   Problem: Coping: Goal: Ability to adjust to condition or change in health will improve Outcome: Not Progressing   Problem: Health Behavior/Discharge Planning: Goal: Ability to identify and utilize available resources and services will improve Outcome: Not Progressing Goal: Ability to manage health-related needs will improve Outcome: Not Progressing   Problem: Tissue Perfusion: Goal: Adequacy of tissue perfusion will improve Outcome: Not Progressing   Problem: Education: Goal: Knowledge of General Education information will improve Description: Including pain rating scale, medication(s)/side effects and non-pharmacologic comfort measures Outcome: Progressing   Problem: Clinical Measurements: Goal: Ability to maintain clinical measurements within normal limits will improve Outcome: Progressing   Problem: Activity: Goal: Risk for activity intolerance will decrease Outcome: Not Progressing   Problem: Skin Integrity: Goal: Risk for impaired skin integrity will decrease Outcome: Not Progressing

## 2024-06-17 NOTE — H&P (View-Only) (Signed)
 Progress Note     Subjective: No complaints. Sacral and ischial wounds evaluated at bedside with nursing assistance.  Objective: Vital signs in last 24 hours: Temp:  [97.7 F (36.5 C)-98.7 F (37.1 C)] 97.7 F (36.5 C) (09/29 0530) Pulse Rate:  [69-93] 69 (09/29 0530) BP: (99-113)/(43-57) 99/57 (09/29 0530) SpO2:  [97 %-99 %] 98 % (09/29 0530) Last BM Date : 06/16/24  Intake/Output from previous day: 09/28 0701 - 09/29 0700 In: 950 [P.O.:600; IV Piggyback:350] Out: 425 [Urine:425] Intake/Output this shift: No intake/output data recorded.  PE: General: pleasant, WD, chronically ill appearing male who is laying in bed in NAD Heart: regular, rate, and rhythm.   Lungs: Respiratory effort nonlabored Abd: soft, NT, ND, SP tube and colostomy present  Skin: sacral and ischial wounds as depicted below; sacrum with more chronic skin changes but largely clean, left ischial wound with purulent drainage and small tract that extends 8 cm inferiorly, no external cellulitis of left posterior thigh        Lab Results:  Recent Labs    06/15/24 1224 06/16/24 0438  WBC 23.9* 17.2*  HGB 8.6* 7.5*  HCT 28.8* 26.9*  PLT 450* 384   BMET Recent Labs    06/15/24 1224 06/16/24 0438  NA 131* 135  K 4.2 3.7  CL 99 105  CO2 20* 19*  GLUCOSE 153* 105*  BUN 32* 23  CREATININE 1.41* 0.97  CALCIUM  8.9 8.4*   PT/INR Recent Labs    06/15/24 1224  LABPROT 17.0*  INR 1.3*   CMP     Component Value Date/Time   NA 135 06/16/2024 0438   NA 140 04/11/2019 1028   K 3.7 06/16/2024 0438   CL 105 06/16/2024 0438   CO2 19 (L) 06/16/2024 0438   GLUCOSE 105 (H) 06/16/2024 0438   BUN 23 06/16/2024 0438   BUN 21 04/11/2019 1028   CREATININE 0.97 06/16/2024 0438   CREATININE 0.26 (L) 07/23/2020 1052   CALCIUM  8.4 (L) 06/16/2024 0438   PROT 5.9 (L) 06/16/2024 0438   PROT 6.6 01/20/2020 0754   ALBUMIN  2.3 (L) 06/16/2024 0438   ALBUMIN  4.2 03/10/2020 1757   AST 35 06/16/2024 0438    ALT 37 06/16/2024 0438   ALKPHOS 159 (H) 06/16/2024 0438   BILITOT 0.2 06/16/2024 0438   BILITOT 0.3 01/20/2020 0754   GFRNONAA >60 06/16/2024 0438   GFRNONAA 87 04/15/2016 1133   GFRAA >60 06/10/2020 0431   GFRAA >89 04/15/2016 1133   Lipase     Component Value Date/Time   LIPASE 36 06/05/2023 1226       Studies/Results: CT ABDOMEN PELVIS W CONTRAST Result Date: 06/15/2024 CLINICAL DATA:  Decubitus ulcer EXAM: CT ABDOMEN AND PELVIS WITH CONTRAST TECHNIQUE: Multidetector CT imaging of the abdomen and pelvis was performed using the standard protocol following bolus administration of intravenous contrast. RADIATION DOSE REDUCTION: This exam was performed according to the departmental dose-optimization program which includes automated exposure control, adjustment of the mA and/or kV according to patient size and/or use of iterative reconstruction technique. CONTRAST:  OMNIPAQUE  IOHEXOL  300 MG/ML  SOLN COMPARISON:  12/01/2023 common 07/30/2023 FINDINGS: Lower chest: Bibasilar scarring and fibrosis. No acute pleural or parenchymal lung disease. Hepatobiliary: Hepatic steatosis. No focal liver abnormality. Prior cholecystectomy. Pancreas: Unremarkable. No pancreatic ductal dilatation or surrounding inflammatory changes. Spleen: Borderline splenomegaly, measuring 12.8 x 7.1 x 11.8 cm. No focal parenchymal abnormality. Adrenals/Urinary Tract: The adrenals are unremarkable. Coarse parenchymal calcifications are identified within the upper pole  left kidney. There also calcifications dependent within a peripelvic upper pole left renal cyst. Other simple cysts are seen within the bilateral kidneys. No specific imaging follow-up is recommended. No obstructive uropathy within either kidney. The bladder is decompressed with a suprapubic catheter, limiting its evaluation. Stomach/Bowel: No bowel obstruction or ileus. There is a loop colostomy within the left lower quadrant. Normal appendix right lower  quadrant. Distal colonic diverticulosis without diverticulitis. There is a large amount of retained stool within the rectal wall, unchanged since prior exam. Vascular/Lymphatic: Aortic atherosclerosis. No enlarged abdominal or pelvic lymph nodes. Reproductive: Prostate is unremarkable. Other: No free fluid or free intraperitoneal gas. No abdominal wall hernia. Musculoskeletal: Large decubitus ulcers are again seen overlying the bilateral ischial tuberosities. Cortical destruction on the left consistent with osteomyelitis is identified on prior MRI. Stable sclerotic changes of the right ischium likely reflect chronic osteomyelitis. On the left, new since prior studies, there is a 4.0 x 6.0 cm complex fluid collection within the posterior upper thigh just inferior to the decubitus ulcer, consistent with abscess. There is also a large sacral decubitus ulcer, though I do not see any bony destruction along the sacrum to suggest osteomyelitis at this time. Reconstructed images demonstrate no additional findings. IMPRESSION: 1. Large decubitus ulcers overlying the sacrum and bilateral ischial tuberosities as above. Findings compatible with acute osteomyelitis within the left ischium, and chronic osteomyelitis within the right ischium. 2. New 4 x 6 cm complex fluid collection within the subcutaneous tissues posterior left thigh, immediately inferior to the left ischial decubitus ulcer, consistent with abscess. 3. Hepatic steatosis. 4. Borderline splenomegaly. 5. Distal colonic diverticulosis without diverticulitis. Stable loop colostomy left lower quadrant. 6. Stable indwelling suprapubic catheter. Electronically Signed   By: Ozell Daring M.D.   On: 06/15/2024 14:37   DG Chest Port 1 View Result Date: 06/15/2024 CLINICAL DATA:  Sacral and bilateral leg wounds. Clinical concern for sepsis. EXAM: PORTABLE CHEST 1 VIEW COMPARISON:  08/06/2023.  Chest CTA dated 07/19/2023. FINDINGS: Stable borderline enlarged cardiac  silhouette. Clear lungs with normal vascularity. Diffuse osteopenia. IMPRESSION: No acute abnormality. Electronically Signed   By: Elspeth Bathe M.D.   On: 06/15/2024 12:06    Anti-infectives: Anti-infectives (From admission, onward)    Start     Dose/Rate Route Frequency Ordered Stop   06/16/24 2000  ceFEPIme  (MAXIPIME ) 2 g in sodium chloride  0.9 % 100 mL IVPB        2 g 200 mL/hr over 30 Minutes Intravenous Every 12 hours 06/16/24 1408     06/16/24 1500  vancomycin  (VANCOCIN ) IVPB 1000 mg/200 mL premix  Status:  Discontinued        1,000 mg 200 mL/hr over 60 Minutes Intravenous Every 24 hours 06/15/24 1731 06/16/24 1353   06/16/24 1500  vancomycin  (VANCOREADY) IVPB 1250 mg/250 mL        1,250 mg 166.7 mL/hr over 90 Minutes Intravenous Every 24 hours 06/16/24 1353     06/16/24 1400  ceFEPIme  (MAXIPIME ) 2 g in sodium chloride  0.9 % 100 mL IVPB  Status:  Discontinued        2 g 200 mL/hr over 30 Minutes Intravenous Every 8 hours 06/16/24 1353 06/16/24 1408   06/16/24 0600  ceFEPIme  (MAXIPIME ) 2 g in sodium chloride  0.9 % 100 mL IVPB  Status:  Discontinued        2 g 200 mL/hr over 30 Minutes Intravenous Every 12 hours 06/15/24 1730 06/16/24 1353   06/15/24 1330  vancomycin  (VANCOCIN ) IVPB 1000  mg/200 mL premix        1,000 mg 200 mL/hr over 60 Minutes Intravenous  Once 06/15/24 1329 06/15/24 1601   06/15/24 1330  ceFEPIme  (MAXIPIME ) 2 g in sodium chloride  0.9 % 100 mL IVPB        2 g 200 mL/hr over 30 Minutes Intravenous  Once 06/15/24 1329 06/15/24 1825   06/15/24 1300  cefTRIAXone  (ROCEPHIN ) 1 g in sodium chloride  0.9 % 100 mL IVPB  Status:  Discontinued        1 g 200 mL/hr over 30 Minutes Intravenous  Once 06/15/24 1259 06/15/24 1340        Assessment/Plan  Posterior left thigh abscess Chronic sacral wound and ischial wounds with osteomyelitis  - CT 9/27 with 4x6 complex collection in left posterior thigh, still having significant purulent drainage from this and not really  able to pack into tract - remainder of wound is chronic and fairly clean - WBC 17K from 23K afebrile and HD stable  - would recommend OR to open up further and wash out and allow for better wound care 9/30  FEN: HH/CM diet, will make NPO after MN VTE: LMWH ID: cefepime /vanc  - per TRH -  CAD s/p DES T2DM HTN HLD Hx of melanoma OSA Transverse myelitis      LOS: 2 days   I reviewed Consultant ID notes, hospitalist notes, last 24 h vitals and pain scores, last 48 h intake and output, last 24 h labs and trends, and last 24 h imaging results.  This care required high  level of medical decision making.    Brent [name] Louder, [facility] Surgery 06/17/2024, 11:31 AM Please see Amion for pager number during day hours 7:00am-4:30pm

## 2024-06-17 NOTE — Consult Note (Addendum)
 WOC Nurse Consult Note: Reason for Consult: requested to assess bilateral leg wounds. Performed remote evaluation of photos and notes. Last WOC consult 09/28, assessed LLE, sacrum/bilateral ischial and L foot plantar ulcer. BLE leg Wound type: Full thickness. Pressure Injury POA: Yes  Please follow the instructions below, after evaluating photos and notes, the dressing are considered appropriate.  Dressing procedure/placement/frequency: Left and right shin: Cleanse LE wounds with saline, pat dry the peri-wound skin. Cover with single layer of xeroform daily, top with silicone foam dressing. The foam can stay up to 3 days if is not saturated, it is ok to lift the foam and reapply Xeroform.  WOC team will not plan to follow further. Please reconsult if further assistance is needed. Thank-you,  Lela Holm RN, CNS, [employer], MSN.  (Phone [phone])

## 2024-06-17 NOTE — Plan of Care (Addendum)
 Id brief note   Bcx negative Sacral ulcer related soft tissue infection/sepsis  Surgery plans I&D   -will see if any culture sent -clinically wbc improving on vanc/cefepime  -if cx sent will taylor abx and plan 7 day abx from the time of surgical I&D -discussed with primary team

## 2024-06-18 ENCOUNTER — Other Ambulatory Visit: Payer: Self-pay

## 2024-06-18 ENCOUNTER — Encounter (HOSPITAL_COMMUNITY): Payer: Self-pay | Admitting: Family Medicine

## 2024-06-18 ENCOUNTER — Inpatient Hospital Stay (HOSPITAL_COMMUNITY): Admitting: Certified Registered Nurse Anesthetist

## 2024-06-18 ENCOUNTER — Encounter (HOSPITAL_COMMUNITY)
Admission: EM | Disposition: A | Discharge status: Skilled Nursing Facility | Payer: Self-pay | Source: Skilled Nursing Facility | Attending: Internal Medicine

## 2024-06-18 DIAGNOSIS — L89154 Pressure ulcer of sacral region, stage 4: Secondary | ICD-10-CM | POA: Diagnosis not present

## 2024-06-18 DIAGNOSIS — L089 Local infection of the skin and subcutaneous tissue, unspecified: Secondary | ICD-10-CM | POA: Diagnosis not present

## 2024-06-18 DIAGNOSIS — I2510 Atherosclerotic heart disease of native coronary artery without angina pectoris: Secondary | ICD-10-CM | POA: Diagnosis not present

## 2024-06-18 DIAGNOSIS — R6510 Systemic inflammatory response syndrome (SIRS) of non-infectious origin without acute organ dysfunction: Secondary | ICD-10-CM | POA: Diagnosis not present

## 2024-06-18 DIAGNOSIS — L02416 Cutaneous abscess of left lower limb: Secondary | ICD-10-CM

## 2024-06-18 DIAGNOSIS — N179 Acute kidney failure, unspecified: Secondary | ICD-10-CM | POA: Diagnosis not present

## 2024-06-18 DIAGNOSIS — A419 Sepsis, unspecified organism: Secondary | ICD-10-CM | POA: Diagnosis not present

## 2024-06-18 LAB — RENAL FUNCTION PANEL
Albumin: 2.4 g/dL — ABNORMAL LOW (ref 3.5–5.0)
Anion gap: 11 (ref 5–15)
BUN: 27 mg/dL — ABNORMAL HIGH (ref 8–23)
CO2: 18 mmol/L — ABNORMAL LOW (ref 22–32)
Calcium: 8.7 mg/dL — ABNORMAL LOW (ref 8.9–10.3)
Chloride: 107 mmol/L (ref 98–111)
Creatinine, Ser: 0.63 mg/dL (ref 0.61–1.24)
GFR, Estimated: >60 mL/min (ref >60)
Glucose, Bld: 271 mg/dL — ABNORMAL HIGH (ref 70–99)
Phosphorus: 3.4 mg/dL (ref 2.5–4.6)
Potassium: 3.3 mmol/L — ABNORMAL LOW (ref 3.5–5.1)
Sodium: 136 mmol/L (ref 135–145)

## 2024-06-18 LAB — GLUCOSE, CAPILLARY
Glucose-Capillary: 241 mg/dL — ABNORMAL HIGH (ref 70–99)
Glucose-Capillary: 261 mg/dL — ABNORMAL HIGH (ref 70–99)
Glucose-Capillary: 222 mg/dL — ABNORMAL HIGH (ref 70–99)
Glucose-Capillary: 199 mg/dL — ABNORMAL HIGH (ref 70–99)
Glucose-Capillary: 211 mg/dL — ABNORMAL HIGH (ref 70–99)
Glucose-Capillary: 211 mg/dL — ABNORMAL HIGH (ref 70–99)

## 2024-06-18 LAB — CULTURE, BLOOD (ROUTINE X 2): Special Requests: ADEQUATE

## 2024-06-18 LAB — URINE CULTURE: Culture: >=100000 — AB

## 2024-06-18 LAB — CBC
HCT: 26.8 % — ABNORMAL LOW (ref 39.0–52.0)
HCT: 25.9 % — ABNORMAL LOW (ref 39.0–52.0)
Hemoglobin: 7.8 g/dL — ABNORMAL LOW (ref 13.0–17.0)
Hemoglobin: 7.2 g/dL — ABNORMAL LOW (ref 13.0–17.0)
MCH: 23.3 pg — ABNORMAL LOW (ref 26.0–34.0)
MCH: 22.6 pg — ABNORMAL LOW (ref 26.0–34.0)
MCHC: 29.1 g/dL — ABNORMAL LOW (ref 30.0–36.0)
MCHC: 27.8 g/dL — ABNORMAL LOW (ref 30.0–36.0)
MCV: 80 fL (ref 80.0–100.0)
MCV: 81.2 fL (ref 80.0–100.0)
Platelets: 359 K/uL (ref 150–400)
Platelets: 369 K/uL (ref 150–400)
RBC: 3.35 MIL/uL — ABNORMAL LOW (ref 4.22–5.81)
RBC: 3.19 MIL/uL — ABNORMAL LOW (ref 4.22–5.81)
RDW: 17.2 % — ABNORMAL HIGH (ref 11.5–15.5)
RDW: 17.2 % — ABNORMAL HIGH (ref 11.5–15.5)
WBC: 11.4 K/uL — ABNORMAL HIGH (ref 4.0–10.5)
WBC: 12.2 K/uL — ABNORMAL HIGH (ref 4.0–10.5)
nRBC: 0 % (ref 0.0–0.2)
nRBC: 0 % (ref 0.0–0.2)

## 2024-06-18 LAB — PREPARE RBC (CROSSMATCH)

## 2024-06-18 SURGERY — INCISION AND DRAINAGE, ABSCESS
Anesthesia: General | Laterality: Left

## 2024-06-18 MED ORDER — OXYCODONE HCL 5 MG/5ML PO SOLN: 5.0000 mg | Freq: Once | ORAL | Status: DC | PRN

## 2024-06-18 MED ORDER — LIDOCAINE HCL (PF) 2 % IJ SOLN
INTRAMUSCULAR | Status: DC | PRN
  Administered 2024-06-18: 60 mg via INTRADERMAL

## 2024-06-18 MED ORDER — ROCURONIUM BROMIDE 10 MG/ML (PF) SYRINGE
PREFILLED_SYRINGE | INTRAVENOUS | Status: AC
Start: 2024-06-18 — End: 2024-06-18
  Filled 2024-06-18: qty 10

## 2024-06-18 MED ORDER — PHENYLEPHRINE 80 MCG/ML (10ML) SYRINGE FOR IV PUSH (FOR BLOOD PRESSURE SUPPORT)
PREFILLED_SYRINGE | INTRAVENOUS | Status: AC
  Filled 2024-06-18: qty 10

## 2024-06-18 MED ORDER — INSULIN GLARGINE 100 UNIT/ML ~~LOC~~ SOLN
7.0000 [IU] | Freq: Every day | SUBCUTANEOUS | Status: DC
Start: 2024-06-18 — End: 2024-06-24
  Administered 2024-06-19 – 2024-06-23 (×6): 7 [IU] via SUBCUTANEOUS
  Filled 2024-06-18 (×7): qty 0.07

## 2024-06-18 MED ORDER — PROPOFOL 10 MG/ML IV BOLUS
INTRAVENOUS | Status: AC
  Filled 2024-06-18: qty 20

## 2024-06-18 MED ORDER — FENTANYL CITRATE (PF) 100 MCG/2ML IJ SOLN
INTRAMUSCULAR | Status: AC
  Filled 2024-06-18: qty 2

## 2024-06-18 MED ORDER — SODIUM CHLORIDE 0.9 % IV BOLUS
1000.0000 mL | Freq: Once | INTRAVENOUS | Status: AC
  Administered 2024-06-18: 1000 mL via INTRAVENOUS

## 2024-06-18 MED ORDER — PHENYLEPHRINE HCL (PRESSORS) 10 MG/ML IV SOLN
INTRAVENOUS | Status: AC
  Filled 2024-06-18: qty 1

## 2024-06-18 MED ORDER — ROCURONIUM BROMIDE 10 MG/ML (PF) SYRINGE
PREFILLED_SYRINGE | INTRAVENOUS | Status: DC | PRN
  Administered 2024-06-18: 50 mg via INTRAVENOUS

## 2024-06-18 MED ORDER — OXYCODONE HCL 5 MG PO TABS: 5.0000 mg | ORAL_TABLET | Freq: Once | ORAL | Status: DC | PRN

## 2024-06-18 MED ORDER — LACTATED RINGERS IV SOLN: INTRAVENOUS | Status: DC | PRN

## 2024-06-18 MED ORDER — FENTANYL CITRATE PF 50 MCG/ML IJ SOSY: 25.0000 ug | PREFILLED_SYRINGE | INTRAMUSCULAR | Status: DC | PRN

## 2024-06-18 MED ORDER — LIDOCAINE HCL (PF) 2 % IJ SOLN
INTRAMUSCULAR | Status: AC
  Filled 2024-06-18: qty 5

## 2024-06-18 MED ORDER — 0.9 % SODIUM CHLORIDE (POUR BTL) OPTIME
TOPICAL | Status: DC | PRN
Start: 2024-06-18 — End: 2024-06-18
  Administered 2024-06-18: 1000 mL

## 2024-06-18 MED ORDER — INSULIN ASPART 100 UNIT/ML IJ SOLN
0.0000 [IU] | INTRAMUSCULAR | Status: DC | PRN
  Administered 2024-06-18: 3 [IU] via SUBCUTANEOUS
  Filled 2024-06-18: qty 1

## 2024-06-18 MED ORDER — AMISULPRIDE (ANTIEMETIC) 5 MG/2ML IV SOLN: 10.0000 mg | Freq: Once | INTRAVENOUS | Status: DC | PRN

## 2024-06-18 MED ORDER — PHENYLEPHRINE HCL-NACL 20-0.9 MG/250ML-% IV SOLN
INTRAVENOUS | Status: DC | PRN
  Administered 2024-06-18: 40 ug/min via INTRAVENOUS

## 2024-06-18 MED ORDER — SUGAMMADEX SODIUM 200 MG/2ML IV SOLN
INTRAVENOUS | Status: DC | PRN
  Administered 2024-06-18: 200 mg via INTRAVENOUS

## 2024-06-18 MED ORDER — PROPOFOL 10 MG/ML IV BOLUS
INTRAVENOUS | Status: DC | PRN
  Administered 2024-06-18: 70 mg via INTRAVENOUS

## 2024-06-18 MED ORDER — MIDODRINE HCL 5 MG PO TABS
10.0000 mg | ORAL_TABLET | Freq: Three times a day (TID) | ORAL | Status: DC
  Administered 2024-06-18 – 2024-06-24 (×15): 10 mg via ORAL
  Filled 2024-06-18 (×17): qty 2

## 2024-06-18 MED ORDER — FENTANYL CITRATE (PF) 100 MCG/2ML IJ SOLN
INTRAMUSCULAR | Status: DC | PRN
  Administered 2024-06-18: 50 ug via INTRAVENOUS

## 2024-06-18 MED ORDER — ONDANSETRON HCL 4 MG/2ML IJ SOLN
INTRAMUSCULAR | Status: AC
  Filled 2024-06-18: qty 2

## 2024-06-18 MED ORDER — SODIUM CHLORIDE 0.9% IV SOLUTION: Freq: Once | INTRAVENOUS | Status: AC

## 2024-06-18 MED ORDER — SUGAMMADEX SODIUM 200 MG/2ML IV SOLN
INTRAVENOUS | Status: AC
  Filled 2024-06-18: qty 2

## 2024-06-18 MED ORDER — PHENYLEPHRINE 80 MCG/ML (10ML) SYRINGE FOR IV PUSH (FOR BLOOD PRESSURE SUPPORT)
PREFILLED_SYRINGE | INTRAVENOUS | Status: DC | PRN
  Administered 2024-06-18 (×2): 80 ug via INTRAVENOUS

## 2024-06-18 SURGICAL SUPPLY — 25 items
BAG COUNTER SPONGE SURGICOUNT (BAG) IMPLANT
BLADE SURG 15 STRL LF DISP TIS (BLADE) ×1 IMPLANT
BNDG GAUZE DERMACEA FLUFF 4 (GAUZE/BANDAGES/DRESSINGS) IMPLANT
CHLORAPREP W/TINT 26 (MISCELLANEOUS) ×1 IMPLANT
DRAPE LAPAROSCOPIC ABDOMINAL (DRAPES) IMPLANT
ELECT REM PT RETURN 15FT ADLT (MISCELLANEOUS) ×1 IMPLANT
GAUZE PAD ABD 8X10 STRL (GAUZE/BANDAGES/DRESSINGS) IMPLANT
GAUZE SPONGE 4X4 12PLY STRL (GAUZE/BANDAGES/DRESSINGS) IMPLANT
GLOVE BIO SURGEON STRL SZ7.5 (GLOVE) ×1 IMPLANT
GLOVE SURG SYN 7.5 PF PI (GLOVE) ×2 IMPLANT
KIT BASIN OR (CUSTOM PROCEDURE TRAY) ×1 IMPLANT
KIT TURNOVER KIT A (KITS) ×1 IMPLANT
NDL HYPO 25X1 1.5 SAFETY (NEEDLE) IMPLANT
NEEDLE HYPO 25X1 1.5 SAFETY (NEEDLE) IMPLANT
NS IRRIG 1000ML POUR BTL (IV SOLUTION) ×1 IMPLANT
PENCIL SMOKE EVACUATOR (MISCELLANEOUS) IMPLANT
SPIKE FLUID TRANSFER (MISCELLANEOUS) IMPLANT
SUT MNCRL AB 4-0 PS2 18 (SUTURE) IMPLANT
SUT VIC AB 3-0 SH 27XBRD (SUTURE) IMPLANT
SWAB COLLECTION DEVICE MRSA (MISCELLANEOUS) IMPLANT
SWAB CULTURE ESWAB REG 1ML (MISCELLANEOUS) IMPLANT
SYR BULB EAR ULCER 3OZ GRN STR (SYRINGE) ×1 IMPLANT
SYR CONTROL 10ML LL (SYRINGE) IMPLANT
TOWEL OR 17X26 10 PK STRL BLUE (TOWEL DISPOSABLE) ×1 IMPLANT
YANKAUER SUCT BULB TIP NO VENT (SUCTIONS) ×1 IMPLANT

## 2024-06-18 NOTE — Progress Notes (Signed)
 Patient was A&Ox4 during shift assessment. Later, he awoke disoriented, asking for his wife and son and unsure of his location. Patient was easily reoriented.

## 2024-06-18 NOTE — Progress Notes (Signed)
 Patient arrived to Short Stay oriented only to self, agitated and demanding to go to the OR.  RN attempted to reorient patient however he became increasingly agitated and yelling out.  Patient unable to state why he is having surgery.  RN called and left VM for wife to return call.  Patient would not allow RN to assess his PIV.  Dr. Niels and Dr. Eletha notified.  Wife arrive to Short Stay, patient became calm and cooperative.

## 2024-06-18 NOTE — Anesthesia Postprocedure Evaluation (Signed)
 Anesthesia Post Note  Patient: Brent [name].  Procedure(s) Performed: INCISIOIN AND DRAINAGE LEFT POSTERIOR THIGH ABSCESS (Left)     Patient location during evaluation: PACU Anesthesia Type: General Level of consciousness: awake and alert Pain management: pain level controlled Vital Signs Assessment: post-procedure vital signs reviewed and stable Respiratory status: spontaneous breathing, nonlabored ventilation, respiratory function stable and patient connected to nasal cannula oxygen Cardiovascular status: blood pressure returned to baseline and stable Postop Assessment: no apparent nausea or vomiting Anesthetic complications: no   No notable events documented.  Last Vitals:  Vitals:   06/18/24 1253 06/18/24 1410  BP: (!) 84/50 (!) 109/44  Pulse: 66 70  Resp: 14 16  Temp: (!) 35.5 C (!) 36.4 C  SpO2: 98% 99%    Last Pain:  Vitals:   06/18/24 1410  TempSrc: Axillary  PainSc:                  [name] L Kendell Sagraves

## 2024-06-18 NOTE — Anesthesia Procedure Notes (Signed)
 Procedure Name: Intubation Date/Time: 06/18/2024 10:07 AM  Performed by: Joshua Vernell BROCKS, CRNAPre-anesthesia Checklist: Patient identified, Emergency Drugs available, Suction available and Patient being monitored Patient Re-evaluated:Patient Re-evaluated prior to induction Oxygen Delivery Method: Circle system utilized Preoxygenation: Pre-oxygenation with 100% oxygen Induction Type: IV induction Ventilation: Mask ventilation without difficulty Laryngoscope Size: Mac and 4 Grade View: Grade I Tube type: Oral Tube size: 7.5 mm Number of attempts: 1 Airway Equipment and Method: Stylet Placement Confirmation: ETT inserted through vocal cords under direct vision, positive ETCO2 and breath sounds checked- equal and bilateral Secured at: 21 cm Tube secured with: Tape Dental Injury: Teeth and Oropharynx as per pre-operative assessment

## 2024-06-18 NOTE — Interval H&P Note (Signed)
 History and Physical Interval Note:  06/18/2024 9:12 AM  Brent [name].  has presented today for surgery, with the diagnosis of Left posterior thigh wound.  The various methods of treatment have been discussed with the patient and family. After consideration of risks, benefits and other options for treatment, the patient has consented to    Procedure(s) with comments: INCISION AND DRAINAGE, ABSCESS (Left) - Left posterior thigh as a surgical intervention.    The patient's history has been reviewed, patient examined, no change in status, stable for surgery.  I have reviewed the patient's chart and labs.  Questions were answered to the patient's satisfaction.    Krystal Spinner, MD [facility] practice Office: [phone]   Krystal Spinner

## 2024-06-18 NOTE — NC FL2 (Signed)
 [place]  MEDICAID FL2 LEVEL OF CARE FORM     IDENTIFICATION  Patient Name: Scout Gumbs. Birthdate: [birth year] Sex: male Admission Date (Current Location): 06/15/2024  [facility] and [place] Number:  Producer, television/film/video and Address:         Provider Number: [id number]  Attending Physician Name and Address:  Davia Nydia POUR, MD  Relative Name and Phone Number:  Kenry Daubert) [phone]    Current Level of Care: Hospital Recommended Level of Care: Nursing Facility Prior Approval Number:    Date Approved/Denied:   PASRR Number:    Discharge Plan: Other (Comment) ([facility] [facility])    Current Diagnoses: Patient Active Problem List   Diagnosis Date Noted   Thigh abscess 06/15/2024   Acute osteomyelitis involving pelvic region and thigh, left (HCC) 06/15/2024   Acute encephalopathy 07/31/2023   Acute respiratory failure (HCC) 07/31/2023   Bilateral pleural effusion 07/26/2023   Acute confusional state 07/26/2023   Pleural effusion 07/19/2023   Leukocytosis 07/19/2023   Sinus bradycardia 07/19/2023   (HFpEF) heart failure with preserved ejection fraction (HCC) 07/14/2023   History of osteomyelitis 07/14/2023   GERD (gastroesophageal reflux disease) 07/14/2023   Diabetic foot ulcer (HCC) 07/01/2023   Acute osteomyelitis of metatarsal bone of right foot (HCC) 07/01/2023   Septic arthritis of foot (HCC) 07/01/2023   Colostomy present (HCC) 07/01/2023   DM type 2 (diabetes mellitus, type 2) (HCC) 07/01/2023   Aortic atherosclerosis 07/01/2023   Pressure ulcer 07/01/2023   Stage IV decubitus ulcer (HCC) 06/30/2023   Urine leukocytes 06/30/2023   SIRS (systemic inflammatory response syndrome) (HCC) 06/05/2023   Suprapubic catheter (HCC) 06/05/2023   Dehydration 02/21/2023   Severe sepsis secondary to acute OM of left ischium and posterior thigh abscess 02/21/2023   Acute metabolic encephalopathy 02/21/2023   Lactic acidosis 02/21/2023   Insulin  dependent  type 2 diabetes mellitus (HCC) 02/21/2023   Normocytic anemia 02/21/2023   Primary malignant neuroendocrine tumor of stomach (HCC) 05/27/2022   Preoperative examination 05/27/2022   Abnormal endoscopy of upper gastrointestinal tract 05/27/2022   Benign neuroendocrine tumor of stomach  04/04/2022   Mucosal abnormality of stomach    Chronic osteomyelitis of pelvic region, right (HCC) 03/25/2022   External gastrointestinal fistula site infection 03/25/2022   Complicated urinary tract infection    Catheter-associated urinary tract infection 08/06/2021   Osteomyelitis of pelvic region, acute, right (HCC) 03/24/2021   Right ischial pressure sore 02/10/2021   Stage IV pressure ulcer of sacral region (HCC) 12/22/2020   Hypokalemia    Encounter for monitoring immunomodulating therapy 08/20/2020   High risk medication use 08/20/2020   Malnutrition of moderate degree 06/04/2020   Palliative care by specialist    Goals of care, counseling/discussion    FTT (failure to thrive) in adult 06/01/2020   Abdominal pain    Sacral ulcer, with necrosis of muscle (HCC) 04/09/2020   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HCC)    Transverse myelitis (HCC)    Labile blood pressure    Labile blood glucose    Neurogenic bladder    Neurogenic bowel    Transaminitis    Controlled type 2 diabetes mellitus with hyperglycemia, with long-term current use of insulin  (HCC)    Quadriplegia (HCC)    Dyslipidemia    Paraplegia (HCC)    Coronary artery disease involving native coronary artery of native heart without angina pectoris    Steroid-induced hyperglycemia    Diabetic peripheral neuropathy (HCC)    AKI (acute kidney injury)  Weakness 03/09/2020   Chest pain 03/08/2020   Right leg numbness    Malignant melanoma of left side of neck (HCC) 10/25/2018   Facial neuritis 10/25/2018   Myofascial pain syndrome 10/25/2018   Occipital neuralgia of left side 10/25/2018   Male hypogonadism 06/20/2016   Renal lesion 06/20/2016    Insomnia 08/07/2015   Enlarged prostate with lower urinary tract symptoms (LUTS) 11/07/2014   Balanitis 07/30/2012   Bladder neck obstruction 07/30/2012   Prostate nodule 06/18/2012   Recurrent nephrolithiasis 06/18/2012   Benign neoplasm of colon 08/29/2011   DEPRESSION, SITUATIONAL, ACUTE 01/23/2010   Essential hypertension 05/30/2009   Coronary atherosclerosis 05/30/2009   Obstructive sleep apnea 11/28/2008   OBESITY 09/23/2008   ERECTILE DYSFUNCTION 11/26/2007   Well controlled type 2 diabetes mellitus with peripheral circulatory disorder (HCC) 05/09/2007   Hyperlipidemia 05/09/2007   MYOCARDIAL INFARCTION, HX OF 05/09/2007   DIVERTICULOSIS, COLON 05/09/2007    Orientation RESPIRATION BLADDER Height & Weight     Self  Normal  (Supra pubic catheter) Weight: 68 kg Height:  6' (182.9 cm)  BEHAVIORAL SYMPTOMS/MOOD NEUROLOGICAL BOWEL NUTRITION STATUS      Incontinent Diet (CHO MOD)  AMBULATORY STATUS COMMUNICATION OF NEEDS Skin   Total Care Verbally Surgical wounds, Other (Comment) (L thigh abscess I&D on 06/18/24;pressure injuries)   PU Stage 2 Dressing: Daily                   Personal Care Assistance Level of Assistance  Bathing, Feeding, Dressing, Total care Bathing Assistance: Maximum assistance Feeding assistance: Limited assistance Dressing Assistance: Limited assistance Total Care Assistance: Maximum assistance   Functional Limitations Info  Sight, Hearing, Speech Sight Info: Impaired (eyeglasses) Hearing Info: Adequate Speech Info: Adequate    SPECIAL CARE FACTORS FREQUENCY                       Contractures Contractures Info: Not present    Additional Factors Info  Code Status, Allergies Code Status Info: Full Allergies Info: Cipro  (Ciprofloxacin  Hcl), Neurontin  (Gabapentin ), Levaquin  (Levofloxacin ), Penicillins           Current Medications (06/18/2024):  This is the current hospital active medication list Current Facility-Administered  Medications  Medication Dose Route Frequency Provider Last Rate Last Admin   0.9 %  sodium chloride  infusion (Manually program via Guardrails IV Fluids)   Intravenous Once Rai, Ripudeep K, MD       acetaminophen  (TYLENOL ) tablet 650 mg  650 mg Oral Q6H PRN Eletha Boas, MD       Or   acetaminophen  (TYLENOL ) suppository 650 mg  650 mg Rectal Q6H PRN Eletha Boas, MD       albuterol  (PROVENTIL ) (2.5 MG/3ML) 0.083% nebulizer solution 2.5 mg  2.5 mg Nebulization BID PRN Eletha Boas, MD       artificial tears ophthalmic solution 1 drop  1 drop Both Eyes Daily Eletha Boas, MD   1 drop at 06/18/24 1412   atorvastatin  (LIPITOR) tablet 40 mg  40 mg Oral QHS Gerkin, Todd, MD   40 mg at 06/17/24 2118   azelastine (ASTELIN) 0.1 % nasal spray 2 spray  2 spray Each Nare Daily Eletha Boas, MD   2 spray at 06/18/24 1413   baclofen  (LIORESAL ) tablet 5 mg  5 mg Oral TID Eletha Boas, MD   5 mg at 06/17/24 2118   ceFEPIme  (MAXIPIME ) 2 g in sodium chloride  0.9 % 100 mL IVPB  2 g Intravenous Q12H Eletha Boas, MD  200 mL/hr at 06/18/24 1419 2 g at 06/18/24 1419   Chlorhexidine  Gluconate Cloth 2 % PADS 6 each  6 each Topical Daily Eletha Boas, MD   6 each at 06/17/24 1000   cholecalciferol  (VITAMIN D3) 25 MCG (1000 UNIT) tablet 2,000 Units  2,000 Units Oral Daily Eletha Boas, MD   2,000 Units at 06/17/24 1008   DULoxetine  (CYMBALTA ) DR capsule 30 mg  30 mg Oral QHS Gerkin, Todd, MD   30 mg at 06/17/24 2117   enoxaparin  (LOVENOX ) injection 40 mg  40 mg Subcutaneous QHS Eletha Boas, MD   40 mg at 06/17/24 2118   famotidine  (PEPCID ) tablet 20 mg  20 mg Oral BID Eletha Boas, MD   20 mg at 06/18/24 1410   ferrous sulfate  tablet 325 mg  325 mg Oral Q breakfast Eletha Boas, MD   325 mg at 06/18/24 1410   fluticasone  (FLONASE ) 50 MCG/ACT nasal spray 1 spray  1 spray Each Nare QHS Eletha Boas, MD   1 spray at 06/17/24 2119   Influenza vac split trivalent PF (FLUZONE HIGH-DOSE) injection 0.5 mL  0.5 mL Intramuscular  Tomorrow-1000 Rai, Ripudeep K, MD       insulin  aspart (novoLOG ) injection 0-15 Units  0-15 Units Subcutaneous TID WC Eletha Boas, MD   8 Units at 06/18/24 9193   insulin  aspart (novoLOG ) injection 0-5 Units  0-5 Units Subcutaneous QHS Gerkin, Todd, MD   3 Units at 06/17/24 2139   insulin  glargine (LANTUS ) injection 7 Units  7 Units Subcutaneous QHS Rai, Ripudeep K, MD       lamoTRIgine  (LAMICTAL ) tablet 50 mg  50 mg Oral BID Gerkin, Todd, MD   50 mg at 06/18/24 1408   loratadine  (CLARITIN ) tablet 10 mg  10 mg Oral Daily Gerkin, Todd, MD   10 mg at 06/17/24 1008   midodrine  (PROAMATINE ) tablet 10 mg  10 mg Oral TID WC Rai, Ripudeep K, MD   10 mg at 06/18/24 1409   mupirocin  ointment (BACTROBAN ) 2 % 1 Application  1 Application Nasal BID Eletha Boas, MD   1 Application at 06/18/24 1413   Muscle Rub CREA   Topical PRN Eletha Boas, MD       olopatadine  (PATANOL) 0.1 % ophthalmic solution 1 drop  1 drop Both Eyes BID Eletha Boas, MD   1 drop at 06/18/24 1412   ondansetron  (ZOFRAN ) tablet 4 mg  4 mg Oral Q6H PRN Eletha Boas, MD       Or   ondansetron  (ZOFRAN ) injection 4 mg  4 mg Intravenous Q6H PRN Eletha Boas, MD   4 mg at 06/18/24 0950   oxyCODONE  (Oxy IR/ROXICODONE ) immediate release tablet 5 mg  5 mg Oral Q6H PRN Eletha Boas, MD   5 mg at 06/17/24 2117   senna-docusate (Senokot-S) tablet 1 tablet  1 tablet Oral BID Eletha Boas, MD   1 tablet at 06/18/24 1409   sodium hypochlorite (DAKIN'S 1/4 STRENGTH) topical solution   Irrigation Q12H Gerkin, Todd, MD   Given at 06/17/24 2111   traZODone  (DESYREL ) tablet 100 mg  100 mg Oral QHS Gerkin, Todd, MD   100 mg at 06/17/24 2117   vancomycin  (VANCOREADY) IVPB 1250 mg/250 mL  1,250 mg Intravenous Q24H Eletha Boas, MD 166.7 mL/hr at 06/17/24 1730 Infusion Verify at 06/17/24 1730     Discharge Medications: Please see discharge summary for a list of discharge medications.  Relevant Imaging Results:  Relevant Lab Results:   Additional  Information [id number]  [phone]  [employer] [employer], RN

## 2024-06-18 NOTE — Inpatient Diabetes Management (Signed)
 Inpatient Diabetes Program Recommendations  AACE/ADA: New Consensus Statement on Inpatient Glycemic Control (2015)  Target Ranges:  Prepandial:   less than 140 mg/dL      Peak postprandial:   less than 180 mg/dL (1-2 hours)      Critically ill patients:  140 - 180 mg/dL   Lab Results  Component Value Date   GLUCAP 199 (H) 06/18/2024   HGBA1C 7.1 (H) 06/15/2024    Review of Glycemic Control  Diabetes history: DM2 Outpatient Diabetes medications: Semglee  26 units BID, Novolog  6 units TID, metformin  500 mg BID Current orders for Inpatient glycemic control: Lantus  7 at bedtime, Novolog  0-15 TID with meals and 0-5 HS  HgbA1C - 7.1% Hypo this am - 38 mg/dL  Inpatient Diabetes Program Recommendations:    If post-prandials > 180 mg/dL, add Novolog  2 units TID with meals if eating > 50%.  Continue to follow.  Thank you. Shona Brandy, RD, LDN, CDCES Inpatient Diabetes Coordinator [phone]

## 2024-06-18 NOTE — Progress Notes (Signed)
 Attempted to contact patient's wife prior to surgery, as her presence is suggested in case the patient is unable to answer questions due to a recent disorientation episode. LVM for wife to contact the hospital.

## 2024-06-18 NOTE — Op Note (Signed)
 Operative Note  Pre-operative Diagnosis: Left thigh abscess, ischial decubitus ulcer  Post-operative Diagnosis: Same  Surgeon:  Krystal Spinner, MD  Assistant: None   Procedure: Wound exploration with incision and drainage and open packing of left posterior thigh abscess  Anesthesia: General  Estimated Blood Loss: Minimal  Drains: None; open packing with Kerlix gauze         Specimen: Aerobic and anaerobic cultures to laboratory  Indications: Patient is a 78 year old male with ischial decubitus ulcer on the left side with underlying osteomyelitis.  Patient has developed an abscess associated with the ischial decubitus ulcer.  He now comes to surgery for incision and drainage and open packing.  Procedure:  The patient was seen in the pre-op holding area. The risks, benefits, complications, treatment options, and expected outcomes were previously discussed with the patient. The patient agreed with the proposed plan and has signed the informed consent form.  The patient was brought to the operating room by the surgical team, identified as Brent [name]. and the procedure verified. A time out was completed and the above information confirmed.  Following administration of general anesthesia, the patient is moved from the hospital bed to the operating room table and placed in a right lateral decubitus position.  Patient is then prepped and draped in usual aseptic fashion.  After ascertaining that an adequate level of anesthesia been achieved, the decubitus ulcer is explored with a [name] clamp.  There is a tract from the base of the ulcer into an abscess cavity extending inferiorly in the posterior thigh.  Using the electrocautery the overlying skin is incised and the abscess cavity is widely opened.  Aerobic and anaerobic cultures are obtained and submitted to the laboratory.  Wound is explored digitally.  Incision is extended along the posterior aspect of the thigh in order to open the entire  abscess cavity.  It extends from the subcutaneous tissues down to the underlying musculature.  Abscess cavity measures approximately 6 cm x 4 cm x 4 cm in size.  Loculations were broken up.  Abscess is irrigated copiously with warm saline using an Asepto syringe.  Hemostasis is achieved with the electrocautery.  Wound is packed with saline moistened 4 inch Kerlix gauze packing.  Dry gauze dressings and an ABD pad are placed over the wound.  Patient is awakened from anesthesia and transported back to the recovery room in stable condition.  The patient tolerated the procedure well.   Krystal Spinner, MD [facility] Surgery Office: [phone]

## 2024-06-18 NOTE — Progress Notes (Signed)
   06/18/24 1253  Vitals  Temp (!) 95.9 F (35.5 C)  Temp Source Axillary  BP (!) 84/50  Pulse Rate 66  Resp 14  MEWS COLOR  MEWS Score Color Yellow  Oxygen Therapy  SpO2 98 %  O2 Device Room Air  MEWS Score  MEWS Temp 1  MEWS Systolic 1  MEWS Pulse 0  MEWS RR 0  MEWS LOC 1  MEWS Score 3   Notifed MD. Applied warm blankets and notified MD of temp and BP.

## 2024-06-18 NOTE — Transfer of Care (Signed)
 Immediate Anesthesia Transfer of Care Note  Patient: Brent [name].  Procedure(s) Performed: INCISIOIN AND DRAINAGE LEFT POSTERIOR THIGH ABSCESS (Left)  Patient Location: PACU  Anesthesia Type:General  Level of Consciousness: awake and alert   Airway & Oxygen Therapy: Patient Spontanous Breathing and Patient connected to face mask oxygen  Post-op Assessment: Report given to RN and Post -op Vital signs reviewed and stable  Post vital signs: Reviewed and stable  Last Vitals:  Vitals Value Taken Time  BP    Temp    Pulse 74 06/18/24 11:14  Resp 24 06/18/24 11:14  SpO2 100 % 06/18/24 11:14  Vitals shown include unfiled device data.  Last Pain:  Vitals:   06/18/24 0828  TempSrc: Oral  PainSc:       Patients Stated Pain Goal: 0 (06/17/24 0955)  Complications: No notable events documented.

## 2024-06-18 NOTE — Progress Notes (Signed)
 Patient's wife returned my call. She will make an attempt to be present at the hospital during pt's surgery. She gives permission to call her for questions if she is unable to make it.

## 2024-06-18 NOTE — Progress Notes (Signed)
 [facility]  Date of Admission:  06/15/2024       Lines:  Peripheral iv's   Abx: 9/27-c vanc 9/27-c cefepime                                                               Assessment: 78 yo male with transverse myelitis and hx gastrocutaneous fistula s/p resection of such, cad, dm2, obesity, chronic sacral decubitus ulcer of several years, melenoma, admitted 9/27 for wife concerning of worsening discharge, with brief confusion at his assisted living facility (normalized on admission)   Presented with leukocytosis, without fever/hemodynamic instability. Ct pelvis showed sacral ulcer associated 4x6 cm left sided posterior superior thigh abscess. Imaging suggestion om Surgery consulted deferring I&D   9/27 admission bcx 1 of 4 bottles grow coagulase negative staph likely contaminant    As for other cases of irreversible cause, chronic decub ulcer associated om, long term treatment of OM is not beneficial   An abscess would likely needs drainage to shorten duration of abx exposure, which is ideal for these cases as high risk for development of mdro. Would recommend 2 weeks of abx tx, with continued wound care/off loading/nutrition support. If persistent or early relapse in setting of abscess, then would recommend abscess debridement at that time  ----------- 9/30 id assessment Improving wbc Afebrile; hds Underwent surgical I&d of sacral abscess and post op brief hypotension resolved by the time I saw him on the floor   Discussed with primary team with the surgery I&d can just do 7 days more of abx starting 9/30. Team is concern about the post op bp issue and want to change tomorrow      Plan: Bcx had remained negative; post op hypotension not unexpected -- resolved He has been improving without I&D even and I wouldn't change recommendation of abx doxy/augmentin  of 7 days from 9/30 course; tomorrow transition to PO abx is reasonable Id will  sign off Maintain standard isolation precaution Discussed with primary team  Principal Problem:   Severe sepsis secondary to acute OM of left ischium and posterior thigh abscess Active Problems:   Hyperlipidemia   Obstructive sleep apnea   Essential hypertension   Coronary artery disease involving native coronary artery of native heart without angina pectoris   AKI (acute kidney injury)   Neurogenic bladder   Chronic osteomyelitis of pelvic region, right (HCC)   Normocytic anemia   Colostomy present (HCC)   DM type 2 (diabetes mellitus, type 2) (HCC)   (HFpEF) heart failure with preserved ejection fraction (HCC)   GERD (gastroesophageal reflux disease)   Thigh abscess   Acute osteomyelitis involving pelvic region and thigh, left (HCC)   Allergies  Allergen Reactions   Cipro  [Ciprofloxacin  Hcl] Other (See Comments)    Hallucinations   Neurontin  [Gabapentin ] Other (See Comments)    Hallucinations   Levaquin  Grady.Goldberg ] Rash   Penicillins Hives, Rash and Other (See Comments)    Tolerated Unasyn , Augmentin , cefepime  and cephalexin     Scheduled Meds:  sodium chloride    Intravenous Once   artificial tears  1 drop Both Eyes Daily   atorvastatin   40 mg Oral QHS   azelastine  2 spray Each Nare Daily   baclofen   5 mg  Oral TID   Chlorhexidine  Gluconate Cloth  6 each Topical Daily   cholecalciferol   2,000 Units Oral Daily   DULoxetine   30 mg Oral QHS   enoxaparin  (LOVENOX ) injection  40 mg Subcutaneous QHS   famotidine   20 mg Oral BID   ferrous sulfate   325 mg Oral Q breakfast   fluticasone   1 spray Each Nare QHS   Influenza vac split trivalent PF  0.5 mL Intramuscular Tomorrow-1000   insulin  aspart  0-15 Units Subcutaneous TID WC   insulin  aspart  0-5 Units Subcutaneous QHS   insulin  glargine  7 Units Subcutaneous QHS   lamoTRIgine   50 mg Oral BID   loratadine   10 mg Oral Daily   midodrine   10 mg Oral TID WC   mupirocin  ointment  1 Application Nasal BID   olopatadine   1  drop Both Eyes BID   senna-docusate  1 tablet Oral BID   sodium hypochlorite   Irrigation Q12H   traZODone   100 mg Oral QHS   Continuous Infusions:  ceFEPime  (MAXIPIME ) IV 2 g (06/18/24 1419)   vancomycin  1,250 mg (06/18/24 1627)   PRN Meds:.acetaminophen  **OR** acetaminophen , albuterol , Muscle Rub, ondansetron  **OR** ondansetron  (ZOFRAN ) IV, oxyCODONE    SUBJECTIVE: Patient got back to floor from I&D Brief hypotension Spoke with nursing staff, and the hemodynamic has normalized Patient no complaint  Family by bedside  Review of Systems: ROS All other ROS was negative, except mentioned above     OBJECTIVE: Vitals:   06/18/24 1145 06/18/24 1200 06/18/24 1253 06/18/24 1410  BP: 102/60 (!) 96/49 (!) 84/50 (!) 109/44  Pulse: 76 75 66 70  Resp: (!) 25 18 14 16   Temp:  (!) 97.5 F (36.4 C) (!) 95.9 F (35.5 C) (!) 97.5 F (36.4 C)  TempSrc:   Axillary Axillary  SpO2: 100% 99% 98% 99%  Weight:      Height:       Body mass index is 20.34 kg/m.  Physical Exam General/constitutional: no distress, pleasant HEENT: Normocephalic, PER CV: rrr no mrg Lungs: clear to auscultation, normal respiratory effort Abd: Soft, Nontender Ext: no edema Skin: No Rash Neuro: paraplegic  Lab Results Lab Results  Component Value Date   WBC 12.2 (H) 06/18/2024   HGB 7.2 (L) 06/18/2024   HCT 25.9 (L) 06/18/2024   MCV 81.2 06/18/2024   PLT 369 06/18/2024    Lab Results  Component Value Date   CREATININE 0.63 06/18/2024   BUN 27 (H) 06/18/2024   NA 136 06/18/2024   K 3.3 (L) 06/18/2024   CL 107 06/18/2024   CO2 18 (L) 06/18/2024    Lab Results  Component Value Date   ALT 37 06/16/2024   AST 35 06/16/2024   ALKPHOS 159 (H) 06/16/2024   BILITOT 0.2 06/16/2024      Microbiology: Recent Results (from the past 240 hours)  Resp panel by RT-PCR (RSV, Flu A&B, Covid) Anterior Nasal Swab     Status: None   Collection Time: 06/15/24 12:24 PM   Specimen: Anterior Nasal Swab   Result Value Ref Range Status   SARS Coronavirus 2 by RT PCR NEGATIVE NEGATIVE Final    Comment: (NOTE) SARS-CoV-2 target nucleic acids are NOT DETECTED.  The SARS-CoV-2 RNA is generally detectable in upper respiratory specimens during the acute phase of infection. The lowest concentration of SARS-CoV-2 viral copies this assay can detect is 138 copies/mL. A negative result does not preclude SARS-Cov-2 infection and should not be used as the sole basis for  treatment or other patient management decisions. A negative result may occur with  improper specimen collection/handling, submission of specimen other than nasopharyngeal swab, presence of viral mutation(s) within the areas targeted by this assay, and inadequate number of viral copies(<138 copies/mL). A negative result must be combined with clinical observations, patient history, and epidemiological information. The expected result is Negative.  Fact Sheet for Patients:  BloggerCourse.com  Fact Sheet for Healthcare Providers:  SeriousBroker.it  This test is no t yet approved or cleared by the [place]  FDA and  has been authorized for detection and/or diagnosis of SARS-CoV-2 by FDA under an Emergency Use Authorization (EUA). This EUA will remain  in effect (meaning this test can be used) for the duration of the COVID-19 declaration under Section 564(b)(1) of the Act, 21 U.S.C.section 360bbb-3(b)(1), unless the authorization is terminated  or revoked sooner.       Influenza A by PCR NEGATIVE NEGATIVE Final   Influenza B by PCR NEGATIVE NEGATIVE Final    Comment: (NOTE) The Xpert Xpress SARS-CoV-2/FLU/RSV plus assay is intended as an aid in the diagnosis of influenza from Nasopharyngeal swab specimens and should not be used as a sole basis for treatment. Nasal washings and aspirates are unacceptable for Xpert Xpress SARS-CoV-2/FLU/RSV testing.  Fact Sheet for  Patients: BloggerCourse.com  Fact Sheet for Healthcare Providers: SeriousBroker.it  This test is not yet approved or cleared by the [place]  FDA and has been authorized for detection and/or diagnosis of SARS-CoV-2 by FDA under an Emergency Use Authorization (EUA). This EUA will remain in effect (meaning this test can be used) for the duration of the COVID-19 declaration under Section 564(b)(1) of the Act, 21 U.S.C. section 360bbb-3(b)(1), unless the authorization is terminated or revoked.     Resp Syncytial Virus by PCR NEGATIVE NEGATIVE Final    Comment: (NOTE) Fact Sheet for Patients: BloggerCourse.com  Fact Sheet for Healthcare Providers: SeriousBroker.it  This test is not yet approved or cleared by the [place]  FDA and has been authorized for detection and/or diagnosis of SARS-CoV-2 by FDA under an Emergency Use Authorization (EUA). This EUA will remain in effect (meaning this test can be used) for the duration of the COVID-19 declaration under Section 564(b)(1) of the Act, 21 U.S.C. section 360bbb-3(b)(1), unless the authorization is terminated or revoked.  Performed at [facility], [address], [place], [place] [address]   Urine Culture     Status: Abnormal   Collection Time: 06/15/24 12:25 PM   Specimen: Urine, Random  Result Value Ref Range Status   Specimen Description   Final    URINE, RANDOM Performed at [facility], [address], [place], [place] [address]    Special Requests   Final    NONE Reflexed from [phone] Performed at [facility], [address] [place], [place] [address]    Culture >=100,000 COLONIES/mL PROTEUS MIRABILIS (A)  Final   Report Status 06/18/2024 FINAL  Final   Organism ID, Bacteria PROTEUS MIRABILIS (A)  Final      Susceptibility   Proteus mirabilis - MIC*     AMPICILLIN  <=2 SENSITIVE Sensitive     CEFAZOLIN  (URINE) Value in next row Sensitive      4 SENSITIVEThis is a modified FDA-approved test that has been validated and its performance characteristics determined by the reporting laboratory.  This laboratory is certified under the Clinical Laboratory Improvement Amendments CLIA as qualified to perform high complexity clinical laboratory testing.    CEFEPIME   Value in next row Sensitive      4 SENSITIVEThis is a modified FDA-approved test that has been validated and its performance characteristics determined by the reporting laboratory.  This laboratory is certified under the Clinical Laboratory Improvement Amendments CLIA as qualified to perform high complexity clinical laboratory testing.    ERTAPENEM Value in next row Sensitive      4 SENSITIVEThis is a modified FDA-approved test that has been validated and its performance characteristics determined by the reporting laboratory.  This laboratory is certified under the Clinical Laboratory Improvement Amendments CLIA as qualified to perform high complexity clinical laboratory testing.    CEFTRIAXONE  Value in next row Sensitive      4 SENSITIVEThis is a modified FDA-approved test that has been validated and its performance characteristics determined by the reporting laboratory.  This laboratory is certified under the Clinical Laboratory Improvement Amendments CLIA as qualified to perform high complexity clinical laboratory testing.    CIPROFLOXACIN  Value in next row Sensitive      4 SENSITIVEThis is a modified FDA-approved test that has been validated and its performance characteristics determined by the reporting laboratory.  This laboratory is certified under the Clinical Laboratory Improvement Amendments CLIA as qualified to perform high complexity clinical laboratory testing.    GENTAMICIN  Value in next row Sensitive      4 SENSITIVEThis is a modified FDA-approved test that has been validated and its  performance characteristics determined by the reporting laboratory.  This laboratory is certified under the Clinical Laboratory Improvement Amendments CLIA as qualified to perform high complexity clinical laboratory testing.    NITROFURANTOIN Value in next row Resistant      4 SENSITIVEThis is a modified FDA-approved test that has been validated and its performance characteristics determined by the reporting laboratory.  This laboratory is certified under the Clinical Laboratory Improvement Amendments CLIA as qualified to perform high complexity clinical laboratory testing.    TRIMETH /SULFA  Value in next row Resistant      4 SENSITIVEThis is a modified FDA-approved test that has been validated and its performance characteristics determined by the reporting laboratory.  This laboratory is certified under the Clinical Laboratory Improvement Amendments CLIA as qualified to perform high complexity clinical laboratory testing.    AMPICILLIN /SULBACTAM Value in next row Sensitive      4 SENSITIVEThis is a modified FDA-approved test that has been validated and its performance characteristics determined by the reporting laboratory.  This laboratory is certified under the Clinical Laboratory Improvement Amendments CLIA as qualified to perform high complexity clinical laboratory testing.    PIP/TAZO Value in next row Sensitive      <=4 SENSITIVEThis is a modified FDA-approved test that has been validated and its performance characteristics determined by the reporting laboratory.  This laboratory is certified under the Clinical Laboratory Improvement Amendments CLIA as qualified to perform high complexity clinical laboratory testing.    MEROPENEM  Value in next row Sensitive      <=4 SENSITIVEThis is a modified FDA-approved test that has been validated and its performance characteristics determined by the reporting laboratory.  This laboratory is certified under the Clinical Laboratory Improvement Amendments CLIA as  qualified to perform high complexity clinical laboratory testing.    * >=100,000 COLONIES/mL PROTEUS MIRABILIS  Blood Culture (routine x 2)     Status: None (Preliminary result)   Collection Time: 06/15/24 12:26 PM   Specimen: BLOOD LEFT FOREARM  Result Value Ref Range Status   Specimen Description   Final    BLOOD  LEFT FOREARM Performed at [facility], [address] [place], [place] [address]    Special Requests   Final    BOTTLES DRAWN AEROBIC AND ANAEROBIC Blood Culture adequate volume Performed at [facility], [address], [place], [place] [address]    Culture   Final    NO GROWTH 2 DAYS Performed at [facility], [address] [place], [place] [address]    Report Status PENDING  Incomplete  Blood Culture (routine x 2)     Status: Abnormal   Collection Time: 06/15/24 12:27 PM   Specimen: BLOOD RIGHT FOREARM  Result Value Ref Range Status   Specimen Description   Final    BLOOD RIGHT FOREARM Performed at [facility], [address], [place], [place] [address]    Special Requests   Final    BOTTLES DRAWN AEROBIC AND ANAEROBIC Blood Culture adequate volume Performed at [facility], [address], [place], [place] [address]    Culture  Setup Time   Final    GRAM POSITIVE COCCI ANAEROBIC BOTTLE ONLY CRITICAL RESULT CALLED TO, READ BACK BY AND VERIFIED WITH: LEANNE POINDEXTER ON 06/16/24 AT 0629 QSD    Culture (A)  Final    STAPHYLOCOCCUS WARNERI THE SIGNIFICANCE OF ISOLATING THIS ORGANISM FROM A SINGLE SET OF BLOOD CULTURES WHEN MULTIPLE SETS ARE DRAWN IS UNCERTAIN. PLEASE NOTIFY THE MICROBIOLOGY DEPARTMENT WITHIN ONE WEEK IF SPECIATION AND SENSITIVITIES ARE REQUIRED. Performed at [facility], [address] [place], [place] [address]    Report Status 06/18/2024 FINAL  Final  Blood Culture ID Panel (Reflexed)     Status: Abnormal   Collection Time: 06/15/24 12:27 PM  Result Value Ref Range Status    Enterococcus faecalis NOT DETECTED NOT DETECTED Final   Enterococcus Faecium NOT DETECTED NOT DETECTED Final   Listeria monocytogenes NOT DETECTED NOT DETECTED Final   Staphylococcus species DETECTED (A) NOT DETECTED Final    Comment: CRITICAL RESULT CALLED TO, READ BACK BY AND VERIFIED WITH: LEANNE POINDEXTER ON 06/16/24 AT 9370 QSD    Staphylococcus aureus (BCID) NOT DETECTED NOT DETECTED Final   Staphylococcus epidermidis NOT DETECTED NOT DETECTED Final   Staphylococcus lugdunensis NOT DETECTED NOT DETECTED Final   Streptococcus species NOT DETECTED NOT DETECTED Final   Streptococcus agalactiae NOT DETECTED NOT DETECTED Final   Streptococcus pneumoniae NOT DETECTED NOT DETECTED Final   Streptococcus pyogenes NOT DETECTED NOT DETECTED Final   A.calcoaceticus-baumannii NOT DETECTED NOT DETECTED Final   Bacteroides fragilis NOT DETECTED NOT DETECTED Final   Enterobacterales NOT DETECTED NOT DETECTED Final   Enterobacter cloacae complex NOT DETECTED NOT DETECTED Final   Escherichia coli NOT DETECTED NOT DETECTED Final   Klebsiella aerogenes NOT DETECTED NOT DETECTED Final   Klebsiella oxytoca NOT DETECTED NOT DETECTED Final   Klebsiella pneumoniae NOT DETECTED NOT DETECTED Final   Proteus species NOT DETECTED NOT DETECTED Final   Salmonella species NOT DETECTED NOT DETECTED Final   Serratia marcescens NOT DETECTED NOT DETECTED Final   Haemophilus influenzae NOT DETECTED NOT DETECTED Final   Neisseria meningitidis NOT DETECTED NOT DETECTED Final   Pseudomonas aeruginosa NOT DETECTED NOT DETECTED Final   Stenotrophomonas maltophilia NOT DETECTED NOT DETECTED Final   Candida albicans NOT DETECTED NOT DETECTED Final   Candida auris NOT DETECTED NOT DETECTED Final   Candida glabrata NOT DETECTED NOT DETECTED Final   Candida krusei NOT DETECTED NOT DETECTED Final   Candida parapsilosis NOT  DETECTED NOT DETECTED Final   Candida tropicalis NOT DETECTED NOT DETECTED Final   Cryptococcus  neoformans/gattii NOT DETECTED NOT DETECTED Final    Comment: Performed at [facility], [address] [place], [place] [address]  MRSA Next Gen by PCR, Nasal     Status: Abnormal   Collection Time: 06/16/24  4:42 AM   Specimen: Nasal Mucosa; Nasal Swab  Result Value Ref Range Status   MRSA by PCR Next Gen DETECTED (A) NOT DETECTED Final    Comment: Results Called to: [employer], J RN AT 1107 ON 06/16/2024 BY PRUDY, K (NOTE) The GeneXpert MRSA Assay (FDA approved for NASAL specimens only), is one component of a comprehensive MRSA colonization surveillance program. It is not intended to diagnose MRSA infection nor to guide or monitor treatment for MRSA infections. Test performance is not FDA approved in patients less than 78 years old. Performed at [facility], [address], [place], [place] [address]   Aerobic/Anaerobic Culture w Gram Stain (surgical/deep wound)     Status: None (Preliminary result)   Collection Time: 06/18/24 10:40 AM   Specimen: Wound; Abscess  Result Value Ref Range Status   Specimen Description   Final    WOUND Performed at [facility], [address], [place], [place] [address]    Special Requests   Final    LEFT POSTERIOR THIGH MASS Performed at [facility], [address], [place], [place] [address]    Gram Stain   Final    MODERATE WBC PRESENT, PREDOMINANTLY PMN RARE GRAM POSITIVE COCCI IN CLUSTERS RARE GRAM NEGATIVE RODS Performed at [facility], [address] [place], [place] [address]    Culture PENDING  Incomplete   Report Status PENDING  Incomplete     Serology:   Imaging: If present, new imagings (plain films, ct scans, and mri) have been personally visualized and interpreted; radiology reports have been reviewed. Decision making incorporated into the Impression / Recommendations.   Constance ONEIDA Passer, MD [facility] [phone] pager    06/18/2024, 5:45 PM

## 2024-06-18 NOTE — Progress Notes (Signed)
 [facility]                                                                              Brent [name], is a 78 y.o. male, DOB - July 17, 1946, FMW:[id number] Admit date - 06/15/2024    Outpatient Primary MD for the patient is Feliciano Devoria LABOR, MD  LOS - 3  days  Chief Complaint  Patient presents with   Fatigue       Brief summary   Patient is a 78 year old male with CAD  s/p DES, T2DM, HTN, HLD, hx of melanoma, OSA, transverse myelitis, stage IV sacral decub ulcer and hx of chronic OM, colostomy and suprapubic catheter who presented to ED with concerns of worsening decubitus ulcer and complaints of fatigue. He has states he has had stage IV ulcers x 4 years and staff thought it was looking worse so brought him to ED. Now has purulent drainage. He states he was out of it at [facility] and was confused as well, but doing much better and back to his baseline mentally.    Wife also reports he has had poor PO intake over the past few weeks with nausea. He has not felt good for this time as well.  CT abdomen/pelvis:  1. Large decubitus ulcers overlying the sacrum and bilateral ischial tuberosities as above. Findings compatible with acute osteomyelitis within the left ischium, and chronic osteomyelitis within the right ischium. 2. New 4 x 6 cm complex fluid collection within the subcutaneous tissues posterior left thigh, immediately inferior to the left ischial decubitus ulcer, consistent with abscess.  Assessment & Plan   Severe sepsis secondary to acute osteomyelitis of left ischium and posterior thigh abscess BCID staph -Presented with worsening stage IV decubitus ulcer with purulent drainage, altered mental status, leukocytosis 23.9, tachypnea, hypotension, AKI - CT abdomen pelvis showed acute osteomyelitis in the left ischium and chronic osteomyelitis in the right ischium.  New 4x 6 cm complex fluid collection in the subcutaneous tissue posterior left thigh  immediately inferior to the left ischial decub ulcer consistent with abscess  - Continue IV vancomycin , cefepime  - Neurosurgery following, status post I&D and wound exploration of the left posterior thigh abscess -Follow cultures, ID following -Postop, patient was hypotensive and hypothermic, received fluid bolus, placed on midodrine , hemoglobin 7.1 postop, ordered 1 unit packed RBCs   AKI (acute kidney injury) -Creatinine baseline 0.8 on 08/07/2023.  Presented with creatinine of 1.41  - Received IV fluids, creatinine now normalized.  UTI -Urine culture showed more than 100,000 colonies of Proteus mirabilis  -Continue IV cefepime    DM type 2 (diabetes mellitus, type 2) (HCC) -Hold metformin , - Hemoglobin A1c 7.1  CBG (last 3)  Recent Labs    06/18/24 0835 06/18/24 0937 06/18/24 1118  GLUCAP 261* 222* 199*   -Placed on sliding scale insulin , moderate - Added Semglee  7 units qhs    Chronic diastolic (HFpEF) heart failure with preserved ejection fraction (HCC) Echo in 06/2023 with normal EF and normal diastolic function.small pericardial effusion - Continue to hold Lasix  for now, poor p.o. intake in the last few days, borderline BP.     Essential  hypertension -Borderline BP, continue to hold Lasix , antihypertensives    Coronary artery disease involving native coronary artery of native heart without angina pectoris Stable, continue medical management    Acute on chronic normocytic anemia ? ACD with chronic OM Baseline hgb around 9-10 -Anemia panel showed percent saturation ratio 6, iron  10, ferritin 198 - Hemoglobin 7.1, postop, transfuse 1 unit packed RBCs   Neurogenic bladder Suprapubic catheter, continue foley care    Hyperlipidemia Continue lipitor 40mg  daily    GERD (gastroesophageal reflux disease) Continue pepcid  BID    Colostomy present (HCC) Colostomy care    Chronic osteomyelitis of pelvic region, right [facility]) Seen by ID in April of 2025       Obstructive sleep apnea States he no longer uses cpap at night Unsure who told him to stop using      Pressure Injury Documentation: Wound 06/15/24 2300 Pressure Injury Buttocks Right;Left;Lower;Mid;Upper Stage 4 - Full thickness tissue loss with exposed bone, tendon or muscle. (Active)     Wound 06/15/24 2300 Pressure Injury Thigh Left;Posterior;Proximal (Active)  Wound care consulted  Estimated body mass index is 20.34 kg/m as calculated from the following:   Height as of this encounter: 6' (1.829 m).   Weight as of this encounter: 68 kg.  Code Status: Full CODE STATUS DVT Prophylaxis:  enoxaparin  (LOVENOX ) injection 40 mg Start: 06/15/24 2200   Level of Care: Level of care: Progressive Family Communication:  Disposition Plan:      Remains inpatient appropriate:      Procedures:    Consultants:   General Surgery Infectious disease  Antimicrobials:   Anti-infectives (From admission, onward)    Start     Dose/Rate Route Frequency Ordered Stop   06/16/24 2000  ceFEPIme  (MAXIPIME ) 2 g in sodium chloride  0.9 % 100 mL IVPB        2 g 200 mL/hr over 30 Minutes Intravenous Every 12 hours 06/16/24 1408     06/16/24 1500  vancomycin  (VANCOCIN ) IVPB 1000 mg/200 mL premix  Status:  Discontinued        1,000 mg 200 mL/hr over 60 Minutes Intravenous Every 24 hours 06/15/24 1731 06/16/24 1353   06/16/24 1500  vancomycin  (VANCOREADY) IVPB 1250 mg/250 mL        1,250 mg 166.7 mL/hr over 90 Minutes Intravenous Every 24 hours 06/16/24 1353     06/16/24 1400  ceFEPIme  (MAXIPIME ) 2 g in sodium chloride  0.9 % 100 mL IVPB  Status:  Discontinued        2 g 200 mL/hr over 30 Minutes Intravenous Every 8 hours 06/16/24 1353 06/16/24 1408   06/16/24 0600  ceFEPIme  (MAXIPIME ) 2 g in sodium chloride  0.9 % 100 mL IVPB  Status:  Discontinued        2 g 200 mL/hr over 30 Minutes Intravenous Every 12 hours 06/15/24 1730 06/16/24 1353   06/15/24 1330  vancomycin  (VANCOCIN ) IVPB 1000 mg/200 mL  premix        1,000 mg 200 mL/hr over 60 Minutes Intravenous  Once 06/15/24 1329 06/15/24 1601   06/15/24 1330  ceFEPIme  (MAXIPIME ) 2 g in sodium chloride  0.9 % 100 mL IVPB        2 g 200 mL/hr over 30 Minutes Intravenous  Once 06/15/24 1329 06/15/24 1825   06/15/24 1300  cefTRIAXone  (ROCEPHIN ) 1 g in sodium chloride  0.9 % 100 mL IVPB  Status:  Discontinued        1 g 200 mL/hr over 30 Minutes Intravenous  Once  06/15/24 1259 06/15/24 1340          Medications  artificial tears  1 drop Both Eyes Daily   atorvastatin   40 mg Oral QHS   azelastine  2 spray Each Nare Daily   baclofen   5 mg Oral TID   Chlorhexidine  Gluconate Cloth  6 each Topical Daily   cholecalciferol   2,000 Units Oral Daily   DULoxetine   30 mg Oral QHS   enoxaparin  (LOVENOX ) injection  40 mg Subcutaneous QHS   famotidine   20 mg Oral BID   ferrous sulfate   325 mg Oral Q breakfast   fluticasone   1 spray Each Nare QHS   Influenza vac split trivalent PF  0.5 mL Intramuscular Tomorrow-1000   insulin  aspart  0-15 Units Subcutaneous TID WC   insulin  aspart  0-5 Units Subcutaneous QHS   lamoTRIgine   50 mg Oral BID   loratadine   10 mg Oral Daily   midodrine   10 mg Oral TID WC   mupirocin  ointment  1 Application Nasal BID   olopatadine   1 drop Both Eyes BID   senna-docusate  1 tablet Oral BID   sodium hypochlorite   Irrigation Q12H   traZODone   100 mg Oral QHS      Subjective:   Apolonio Cutting was seen and examined today. No acute issues this morning, BP was normal, no fever chills, chest pain or shortness of breath.  Alert and awake. Post op, BP soft, temp down to 95.9 F   Objective:   Vitals:   06/18/24 1145 06/18/24 1200 06/18/24 1253 06/18/24 1410  BP: 102/60 (!) 96/49 (!) 84/50 (!) 109/44  Pulse: 76 75 66 70  Resp: (!) 25 18 14 16   Temp:  (!) 97.5 F (36.4 C) (!) 95.9 F (35.5 C) (!) 97.5 F (36.4 C)  TempSrc:   Axillary Axillary  SpO2: 100% 99% 98% 99%  Weight:      Height:         Intake/Output Summary (Last 24 hours) at 06/18/2024 1424 Last data filed at 06/18/2024 9367 Gross per 24 hour  Intake 930.09 ml  Output 1500 ml  Net -569.91 ml     Wt Readings from Last 3 Encounters:  06/18/24 68 kg  12/21/23 89.5 kg  08/07/23 89.5 kg    Physical Exam General: Alert and oriented, NAD, pleasant Cardiovascular: S1 S2 clear, RRR.  Respiratory: CTAB, no wheezing Gastrointestinal: Soft, nontender, nondistended, NBS Ext: no pedal edema bilaterally Neuro: no new deficits Skin: Sacral decub Psych: Normal affect    Data Reviewed:  I have personally reviewed following labs    CBC Lab Results  Component Value Date   WBC 12.2 (H) 06/18/2024   RBC 3.19 (L) 06/18/2024   HGB 7.2 (L) 06/18/2024   HCT 25.9 (L) 06/18/2024   MCV 81.2 06/18/2024   MCH 22.6 (L) 06/18/2024   PLT 369 06/18/2024   MCHC 27.8 (L) 06/18/2024   RDW 17.2 (H) 06/18/2024   LYMPHSABS 2.1 06/15/2024   MONOABS 1.3 (H) 06/15/2024   EOSABS 0.0 06/15/2024   BASOSABS 0.1 06/15/2024     Last metabolic panel Lab Results  Component Value Date   NA 136 06/18/2024   K 3.3 (L) 06/18/2024   CL 107 06/18/2024   CO2 18 (L) 06/18/2024   BUN 27 (H) 06/18/2024   CREATININE 0.63 06/18/2024   GLUCOSE 271 (H) 06/18/2024   GFRNONAA >60 06/18/2024   GFRAA >60 06/10/2020   CALCIUM  8.7 (L) 06/18/2024   PHOS 3.4 06/18/2024  PROT 5.9 (L) 06/16/2024   ALBUMIN  2.4 (L) 06/18/2024   LABGLOB 2.0 04/11/2019   AGRATIO 2.3 (H) 04/11/2019   BILITOT 0.2 06/16/2024   ALKPHOS 159 (H) 06/16/2024   AST 35 06/16/2024   ALT 37 06/16/2024   ANIONGAP 11 06/18/2024    CBG (last 3)  Recent Labs    06/18/24 0835 06/18/24 0937 06/18/24 1118  GLUCAP 261* 222* 199*      Coagulation Profile: Recent Labs  Lab 06/15/24 1224  INR 1.3*     Radiology Studies: I have personally reviewed the imaging studies  No results found.      Nydia Distance M.D. [facility] 06/18/2024, 2:24 PM  Available via  Epic secure chat 7am-7pm After 7 pm, please refer to night coverage provider listed on amion.

## 2024-06-18 NOTE — Anesthesia Preprocedure Evaluation (Addendum)
 Anesthesia Evaluation  Patient identified by MRN, date of birth, ID band Patient confused    Reviewed: Allergy  & Precautions, NPO status , Patient's Chart, lab work & pertinent test results, reviewed documented beta blocker date and time   History of Anesthesia Complications (+) DIFFICULT AIRWAY and history of anesthetic complications (last airway note: grade 1 with miller)  Airway Mallampati: II  TM Distance: >3 FB Neck ROM: Full    Dental  (+) Dental Advisory Given, Chipped,    Pulmonary sleep apnea , former smoker   Pulmonary exam normal breath sounds clear to auscultation       Cardiovascular hypertension, Pt. on home beta blockers and Pt. on medications + CAD, + Past MI, + Cardiac Stents and + Peripheral Vascular Disease  Normal cardiovascular exam Rhythm:Regular Rate:Normal  TTE 2024  1. Left ventricular ejection fraction, by estimation, is 60 to 65%. The  left ventricle has normal function. The left ventricle has no regional  wall motion abnormalities. There is mild left ventricular hypertrophy.  Left ventricular diastolic parameters  were normal.   2. Right ventricular systolic function is normal. The right ventricular  size is normal. Tricuspid regurgitation signal is inadequate for assessing  PA pressure.   3. A small pericardial effusion is present.   4. The mitral valve is normal in structure. Trivial mitral valve  regurgitation. No evidence of mitral stenosis.   5. The aortic valve is tricuspid. Aortic valve regurgitation is not  visualized. Aortic valve sclerosis is present, with no evidence of aortic  valve stenosis.   6. The inferior vena cava is dilated in size with >50% respiratory  variability, suggesting right atrial pressure of 8 mmHg.      Neuro/Psych  Headaches PSYCHIATRIC DISORDERS  Depression     Neuromuscular disease (neuromyelitis optica: paraplegic, can move arms but cannot use hands)     GI/Hepatic Neg liver ROS,GERD  ,,  Endo/Other  diabetes, Type 2, Insulin  Dependent, Oral Hypoglycemic Agents    Renal/GU negative Renal ROS  negative genitourinary   Musculoskeletal  (+) Arthritis ,    Abdominal   Peds  Hematology negative hematology ROS (+)   Anesthesia Other Findings   Reproductive/Obstetrics                              Anesthesia Physical Anesthesia Plan  ASA: 3  Anesthesia Plan: General   Post-op Pain Management: Ofirmev  IV (intra-op)*   Induction: Intravenous  PONV Risk Score and Plan: 2 and Ondansetron  and Treatment may vary due to age or medical condition  Airway Management Planned: Oral ETT  Additional Equipment:   Intra-op Plan:   Post-operative Plan: Extubation in OR  Informed Consent: I have reviewed the patients History and Physical, chart, labs and discussed the procedure including the risks, benefits and alternatives for the proposed anesthesia with the patient or authorized representative who has indicated his/her understanding and acceptance.     Dental advisory given  Plan Discussed with: CRNA  Anesthesia Plan Comments:          Anesthesia Quick Evaluation

## 2024-06-19 ENCOUNTER — Encounter (HOSPITAL_COMMUNITY): Payer: Self-pay | Admitting: Surgery

## 2024-06-19 DIAGNOSIS — R6520 Severe sepsis without septic shock: Secondary | ICD-10-CM | POA: Diagnosis not present

## 2024-06-19 DIAGNOSIS — A419 Sepsis, unspecified organism: Secondary | ICD-10-CM | POA: Diagnosis not present

## 2024-06-19 LAB — RENAL FUNCTION PANEL
Albumin: 2.2 g/dL — ABNORMAL LOW (ref 3.5–5.0)
Anion gap: 10 (ref 5–15)
BUN: 19 mg/dL (ref 8–23)
CO2: 18 mmol/L — ABNORMAL LOW (ref 22–32)
Calcium: 8.5 mg/dL — ABNORMAL LOW (ref 8.9–10.3)
Chloride: 109 mmol/L (ref 98–111)
Creatinine, Ser: 0.56 mg/dL — ABNORMAL LOW (ref 0.61–1.24)
GFR, Estimated: >60 mL/min (ref >60)
Glucose, Bld: 189 mg/dL — ABNORMAL HIGH (ref 70–99)
Phosphorus: 2.9 mg/dL (ref 2.5–4.6)
Potassium: 3 mmol/L — ABNORMAL LOW (ref 3.5–5.1)
Sodium: 138 mmol/L (ref 135–145)

## 2024-06-19 LAB — CBC
HCT: 30.4 % — ABNORMAL LOW (ref 39.0–52.0)
Hemoglobin: 9.1 g/dL — ABNORMAL LOW (ref 13.0–17.0)
MCH: 24.7 pg — ABNORMAL LOW (ref 26.0–34.0)
MCHC: 29.9 g/dL — ABNORMAL LOW (ref 30.0–36.0)
MCV: 82.4 fL (ref 80.0–100.0)
Platelets: 394 K/uL (ref 150–400)
RBC: 3.69 MIL/uL — ABNORMAL LOW (ref 4.22–5.81)
RDW: 17.3 % — ABNORMAL HIGH (ref 11.5–15.5)
WBC: 11.9 K/uL — ABNORMAL HIGH (ref 4.0–10.5)
nRBC: 0 % (ref 0.0–0.2)

## 2024-06-19 LAB — GLUCOSE, CAPILLARY
Glucose-Capillary: 200 mg/dL — ABNORMAL HIGH (ref 70–99)
Glucose-Capillary: 203 mg/dL — ABNORMAL HIGH (ref 70–99)
Glucose-Capillary: 38 mg/dL — CL (ref 70–99)
Glucose-Capillary: 281 mg/dL — ABNORMAL HIGH (ref 70–99)
Glucose-Capillary: 321 mg/dL — ABNORMAL HIGH (ref 70–99)
Glucose-Capillary: 282 mg/dL — ABNORMAL HIGH (ref 70–99)
Glucose-Capillary: 270 mg/dL — ABNORMAL HIGH (ref 70–99)
Glucose-Capillary: 298 mg/dL — ABNORMAL HIGH (ref 70–99)

## 2024-06-19 LAB — TYPE AND SCREEN
ABO/RH(D): O POS
Antibody Screen: NEGATIVE
Unit division: 0

## 2024-06-19 LAB — BPAM RBC
Blood Product Expiration Date: 202511012359
ISSUE DATE / TIME: 202509301808
Unit Type and Rh: 5100

## 2024-06-19 MED ORDER — POTASSIUM CHLORIDE CRYS ER 20 MEQ PO TBCR
40.0000 meq | EXTENDED_RELEASE_TABLET | Freq: Once | ORAL | Status: AC
  Administered 2024-06-19: 40 meq via ORAL
  Filled 2024-06-19: qty 2

## 2024-06-19 MED ORDER — GLUCERNA SHAKE PO LIQD
237.0000 mL | Freq: Three times a day (TID) | ORAL | Status: DC
  Administered 2024-06-19 – 2024-06-23 (×9): 237 mL via ORAL
  Filled 2024-06-19 (×17): qty 237

## 2024-06-19 MED ORDER — LACTATED RINGERS IV SOLN: INTRAVENOUS | Status: AC

## 2024-06-19 MED ORDER — BOOST PLUS PO LIQD
237.0000 mL | Freq: Two times a day (BID) | ORAL | Status: DC
  Filled 2024-06-19: qty 237

## 2024-06-19 MED ORDER — ENSURE PLUS HIGH PROTEIN PO LIQD: 237.0000 mL | Freq: Two times a day (BID) | ORAL | Status: DC

## 2024-06-19 MED ORDER — DEXTROSE 50 % IV SOLN
INTRAVENOUS | Status: AC
  Filled 2024-06-19: qty 50

## 2024-06-19 MED ORDER — GLUCAGON HCL RDNA (DIAGNOSTIC) 1 MG IJ SOLR
INTRAMUSCULAR | Status: AC
  Filled 2024-06-19: qty 1

## 2024-06-19 MED ORDER — VITAMIN C 500 MG PO TABS
500.0000 mg | ORAL_TABLET | Freq: Every day | ORAL | Status: DC
  Administered 2024-06-19 – 2024-06-24 (×6): 500 mg via ORAL
  Filled 2024-06-19 (×6): qty 1

## 2024-06-19 MED ORDER — ZINC SULFATE 220 (50 ZN) MG PO CAPS
220.0000 mg | ORAL_CAPSULE | Freq: Every day | ORAL | Status: DC
  Administered 2024-06-19 – 2024-06-24 (×6): 220 mg via ORAL
  Filled 2024-06-19 (×7): qty 1

## 2024-06-19 MED ORDER — THIAMINE MONONITRATE 100 MG PO TABS
100.0000 mg | ORAL_TABLET | Freq: Every day | ORAL | Status: DC
  Administered 2024-06-19 – 2024-06-24 (×6): 100 mg via ORAL
  Filled 2024-06-19 (×6): qty 1

## 2024-06-19 NOTE — Progress Notes (Signed)
 Progress Note  1 Day Post-Op  Subjective: Patient reports that he feels better overall. Feels pain has improved.  ROS  All negative with the exception of above.  Objective: Vital signs in last 24 hours: Temp:  [97.5 F (36.4 C)-98 F (36.7 C)] 97.6 F (36.4 C) (10/01 0536) Pulse Rate:  [58-79] 79 (10/01 0905) Resp:  [14-20] 18 (10/01 0905) BP: (109-140)/(44-54) 140/54 (10/01 0905) SpO2:  [98 %-100 %] 100 % (10/01 0905) Last BM Date : 06/16/24  Intake/Output from previous day: 09/30 0701 - 10/01 0700 In: 1707 [I.V.:1000; Blood:440; IV Piggyback:267] Out: 300 [Urine:300] Intake/Output this shift: Total I/O In: 120 [P.O.:120] Out: -   PE: Nursing staff present and assisted during physical exam/dressing change General: Pleasant male that is chronically ill appearing who is laying in bed in NAD. HEENT: Head is normocephalic, atraumatic. Heart: HR normal. Lungs: Respiratory effort nonlabored. Abd: Soft, NT, ND. Sp tube present. Colostomy of LLQ. Skin: Wounds as pictured of left thigh wound and chronic sacral/ischial wounds. Packing removed from left thigh wound. Some drainage but no concern of significant purulent discharge. Area with some erythema, but overall improved. Soaked curlex with normal saline and left thigh wound repacked.   Neuro: Cranial nerves 2-12 grossly intact, sensation is normal throughout Psych: A&Ox3 with an appropriate affect.    Lab Results:  Recent Labs    06/18/24 1342 06/19/24 0458  WBC 12.2* 11.9*  HGB 7.2* 9.1*  HCT 25.9* 30.4*  PLT 369 394   BMET Recent Labs    06/18/24 0555 06/19/24 0458  NA 136 138  K 3.3* 3.0*  CL 107 109  CO2 18* 18*  GLUCOSE 271* 189*  BUN 27* 19  CREATININE 0.63 0.56*  CALCIUM  8.7* 8.5*   PT/INR No results for input(s): LABPROT, INR in the last 72 hours. CMP     Component Value Date/Time   NA 138 06/19/2024 0458   NA 140 04/11/2019 1028   K 3.0 (L) 06/19/2024 0458   CL 109 06/19/2024 0458    CO2 18 (L) 06/19/2024 0458   GLUCOSE 189 (H) 06/19/2024 0458   BUN 19 06/19/2024 0458   BUN 21 04/11/2019 1028   CREATININE 0.56 (L) 06/19/2024 0458   CREATININE 0.26 (L) 07/23/2020 1052   CALCIUM  8.5 (L) 06/19/2024 0458   PROT 5.9 (L) 06/16/2024 0438   PROT 6.6 01/20/2020 0754   ALBUMIN  2.2 (L) 06/19/2024 0458   ALBUMIN  4.2 03/10/2020 1757   AST 35 06/16/2024 0438   ALT 37 06/16/2024 0438   ALKPHOS 159 (H) 06/16/2024 0438   BILITOT 0.2 06/16/2024 0438   BILITOT 0.3 01/20/2020 0754   GFRNONAA >60 06/19/2024 0458   GFRNONAA 87 04/15/2016 1133   GFRAA >60 06/10/2020 0431   GFRAA >89 04/15/2016 1133   Lipase     Component Value Date/Time   LIPASE 36 06/05/2023 1226       Studies/Results: No results found.  Anti-infectives: Anti-infectives (From admission, onward)    Start     Dose/Rate Route Frequency Ordered Stop   06/16/24 2000  ceFEPIme  (MAXIPIME ) 2 g in sodium chloride  0.9 % 100 mL IVPB        2 g 200 mL/hr over 30 Minutes Intravenous Every 12 hours 06/16/24 1408     06/16/24 1500  vancomycin  (VANCOCIN ) IVPB 1000 mg/200 mL premix  Status:  Discontinued        1,000 mg 200 mL/hr over 60 Minutes Intravenous Every 24 hours 06/15/24 1731 06/16/24 1353  06/16/24 1500  vancomycin  (VANCOREADY) IVPB 1250 mg/250 mL        1,250 mg 166.7 mL/hr over 90 Minutes Intravenous Every 24 hours 06/16/24 1353     06/16/24 1400  ceFEPIme  (MAXIPIME ) 2 g in sodium chloride  0.9 % 100 mL IVPB  Status:  Discontinued        2 g 200 mL/hr over 30 Minutes Intravenous Every 8 hours 06/16/24 1353 06/16/24 1408   06/16/24 0600  ceFEPIme  (MAXIPIME ) 2 g in sodium chloride  0.9 % 100 mL IVPB  Status:  Discontinued        2 g 200 mL/hr over 30 Minutes Intravenous Every 12 hours 06/15/24 1730 06/16/24 1353   06/15/24 1330  vancomycin  (VANCOCIN ) IVPB 1000 mg/200 mL premix        1,000 mg 200 mL/hr over 60 Minutes Intravenous  Once 06/15/24 1329 06/15/24 1601   06/15/24 1330  ceFEPIme  (MAXIPIME )  2 g in sodium chloride  0.9 % 100 mL IVPB        2 g 200 mL/hr over 30 Minutes Intravenous  Once 06/15/24 1329 06/15/24 1825   06/15/24 1300  cefTRIAXone  (ROCEPHIN ) 1 g in sodium chloride  0.9 % 100 mL IVPB  Status:  Discontinued        1 g 200 mL/hr over 30 Minutes Intravenous  Once 06/15/24 1259 06/15/24 1340        Assessment/Plan POD1: S/P Wound exploration with incision and drainage and open packing of left posterior thigh abscess by Dr. Krystal Spinner on 06/18/2024 -Afebrile during encounter   -WBC 11.9 frp, 12.2 -HGB 9.1 from 7.2; Patient received RBC transfusion 06/18/2024. -Cx in preliminary state: Moderate WBC, predominantly PMN rare gram positive cocci in clusters rare gram neg rods; Rare proteus mirabilis susceptibilities to follow. -On exam, patient feels that wound pain has improved. Left posterior thigh wound with healthy tissue. No worsening concerns of area. Dressing exchanged with nursing staff and patient tolerated well.  -Recommend wet-to dry dressings BID or if soiled.  -Patient does not need outpatient follow up with our service. Would recommend wound care referral at discharge.  -Abx per I&D. -General surgery will sign off. Please call with further questions or concerns.   FEN: HH/carb mod; IVF per primary team VTE: Lovenox  ID: [email or web address] /[email or web address]     LOS: 4 days   I reviewed specialist notes, nursing notes, last 24 h vitals and pain scores, last 48 h intake and output, last 24 h labs and trends, and last 24 h imaging results.   Marjorie Carlyon Favre, [facility] Surgery 06/19/2024, 1:51 PM Please see Amion for pager number during day hours 7:00am-4:30pm

## 2024-06-19 NOTE — Progress Notes (Addendum)
 PROGRESS NOTE    Brent [name].  FMW:[id number] DOB: 11/15/1945 DOA: 06/15/2024 PCP: Feliciano Devoria LABOR, MD   Brief Narrative: 78 year old with past medical history significant for CAD status post DES, diabetes type 2, hypertension, hyperlipidemia, history of melanoma, OSA, transfer myelitis, stage IV sacral decubitus ulcer, history of chronic OM, colostomy and suprapubic catheter presents to the ED with concern of worsening decubitus ulcer and complaints of fatigue.  He has had a stage IV ulcers for 4-year, wound appears to be getting worse so he presented to the ED.  He was also noted to be confused at rehab.  Patient has had poor oral intake and nausea over the last few weeks.  CT abdomen and pelvis showed large decubitus ulcer overlying the sacrum and bilateral ischial tuberosity.  Finding compatible with acute osteomyelitis within the left ischium and chronic osteomyelitis within the right ischium.  New 4 x 6 cm complex fluid collection within the subcutaneous tissue posterior to the left thigh, immediately inferior to the left ischial decubitus ulcer consistent with abscess.  Surgery consulted and patient underwent wound exploration with incision and drainage and open packing of the left posterior thigh abscess on 9/30 by Dr. Krystal Bergamo   Assessment & Plan:   Principal Problem:   Severe sepsis secondary to acute OM of left ischium and posterior thigh abscess Active Problems:   AKI (acute kidney injury)   DM type 2 (diabetes mellitus, type 2) (HCC)   (HFpEF) heart failure with preserved ejection fraction (HCC)   Essential hypertension   Coronary artery disease involving native coronary artery of native heart without angina pectoris   Normocytic anemia   Neurogenic bladder   Hyperlipidemia   GERD (gastroesophageal reflux disease)   Colostomy present (HCC)   Chronic osteomyelitis of pelvic region, right (HCC)   Obstructive sleep apnea   Thigh abscess   Acute osteomyelitis  involving pelvic region and thigh, left (HCC)   Wound infection   1-Severe sepsis secondary to Acute osteomyelitis of the left ischium and posterior thigh abscess Staph warnery positive Blood culture, thought to be contaminate.  Stage IV right and left lower mid upper decubitus ulcer and left thigh pressure posterior abscess -Presented with altered mental status, leukocytosis, hypotension, AKI, tachypnea source of infection decubitus ulcer - Continue with IV antibiotics, patient refusing oral medication at times - Underwent wound exploration with incision and drainage and open packing active left posterior thigh abscess by surgery on 9/30. - Transition to oral Doxy and Augmentin  for 7 days when tolerating orals -Follow wound culture results:   AKI - Creatinine baseline 0.8.  Presented with a creatinine of 1.4 - Improved with fluids  Hypokalemia: Replace  UTI: Urine culture growing 100,000 colonies of Proteus mirabilis - On IV cefepime   Diabetes type 2: Hyperglycemia - Continue to hold metformin  -A1c 7.1 Sliding scale insulin  on Semglee  and  Chronic diastolic heart failure preserved ejection fraction - Holding Lasix  for oral intake. -Monitor on low rate IV fluids  Essential hypertension: - Hold Lasix  and antihypertensive  Coronary artery disease involving native coronary artery of native heart without angina pectoris - Stable  Acute on chronic normocytic anemia - Questionable ACD with chronic OM Baseline hemoglobin 9-10 Component  of iron  deficiency anemia Received 1 unit of packed red blood cell  Neurogenic bladder Suprapubic catheter, continue foley care    Hyperlipidemia Continue lipitor 40mg  daily    GERD (gastroesophageal reflux disease) Continue pepcid  BID    Colostomy present (HCC) Colostomy care  Chronic osteomyelitis of pelvic region, right [facility]) Seen by ID in April of 2025      Obstructive sleep apnea States he no longer uses cpap at night      See wound care documentation below Wound 06/15/24 2300 Pressure Injury Buttocks Right;Left;Lower;Mid;Upper Stage 4 - Full thickness tissue loss with exposed bone, tendon or muscle. (Active)     Wound 06/15/24 2300 Pressure Injury Thigh Left;Posterior;Proximal (Active)     Estimated body mass index is 20.34 kg/m as calculated from the following:   Height as of this encounter: 6' (1.829 m).   Weight as of this encounter: 68 kg.   DVT prophylaxis: Lovenox  Code Status: Full code Family Communication: Disposition Plan:  Status is: Inpatient Remains inpatient appropriate because: Management of wound infection    Consultants:  General surgery  Procedures:  none  Antimicrobials:    Subjective: He is alert, pleasantly confuse. Does not have appetitive, does not want to eat.    Objective: Vitals:   06/18/24 1831 06/18/24 1943 06/18/24 2120 06/19/24 0536  BP: (!) 124/45 (!) 120/47 (!) 123/46 (!) 131/51  Pulse:  70 69 (!) 58  Resp: 14 17 20 15   Temp: 97.8 F (36.6 C) 98 F (36.7 C) (!) 97.5 F (36.4 C) 97.6 F (36.4 C)  TempSrc: Oral     SpO2: 100% 100% 100% 100%  Weight:      Height:        Intake/Output Summary (Last 24 hours) at 06/19/2024 0735 Last data filed at 06/19/2024 0600 Gross per 24 hour  Intake 1707 ml  Output 300 ml  Net 1407 ml   Filed Weights   06/15/24 1221 06/18/24 0829  Weight: 68 kg 68 kg    Examination:  General exam: Appears calm and comfortable  Respiratory system: Clear to auscultation. Respiratory effort normal. Cardiovascular system: S1 & S2 heard, RRR. No JVD, murmurs, rubs, gallops or clicks. No pedal edema. Gastrointestinal system: Abdomen is nondistended, soft and nontender. Colostomy in place, supra-pubic catheter in place.  Central nervous system: Alert     Data Reviewed: I have personally reviewed following labs and imaging studies  CBC: Recent Labs  Lab 06/15/24 1224 06/16/24 0438 06/18/24 0555 06/18/24 1342  06/19/24 0458  WBC 23.9* 17.2* 11.4* 12.2* 11.9*  NEUTROABS 20.1*  --   --   --   --   HGB 8.6* 7.5* 7.8* 7.2* 9.1*  HCT 28.8* 26.9* 26.8* 25.9* 30.4*  MCV 78.5* 80.1 80.0 81.2 82.4  PLT 450* 384 359 369 394   Basic Metabolic Panel: Recent Labs  Lab 06/15/24 1224 06/16/24 0438 06/18/24 0555 06/19/24 0458  NA 131* 135 136 138  K 4.2 3.7 3.3* 3.0*  CL 99 105 107 109  CO2 20* 19* 18* 18*  GLUCOSE 153* 105* 271* 189*  BUN 32* 23 27* 19  CREATININE 1.41* 0.97 0.63 0.56*  CALCIUM  8.9 8.4* 8.7* 8.5*  PHOS  --   --  3.4 2.9   GFR: Estimated Creatinine Clearance: 72 mL/min (A) (by C-G formula based on SCr of 0.56 mg/dL (L)). Liver Function Tests: Recent Labs  Lab 06/15/24 1224 06/16/24 0438 06/18/24 0555 06/19/24 0458  AST 40 35  --   --   ALT 48* 37  --   --   ALKPHOS 157* 159*  --   --   BILITOT 0.5 0.2  --   --   PROT 6.7 5.9*  --   --   ALBUMIN  2.7* 2.3* 2.4* 2.2*  No results for input(s): LIPASE, AMYLASE in the last 168 hours. No results for input(s): AMMONIA in the last 168 hours. Coagulation Profile: Recent Labs  Lab 06/15/24 1224  INR 1.3*   Cardiac Enzymes: No results for input(s): CKTOTAL, CKMB, CKMBINDEX, TROPONINI in the last 168 hours. BNP (last 3 results) No results for input(s): PROBNP in the last 8760 hours. HbA1C: No results for input(s): HGBA1C in the last 72 hours. CBG: Recent Labs  Lab 06/18/24 0835 06/18/24 0937 06/18/24 1118 06/18/24 1632 06/18/24 1959  GLUCAP 261* 222* 199* 211* 211*   Lipid Profile: No results for input(s): CHOL, HDL, LDLCALC, TRIG, CHOLHDL, LDLDIRECT in the last 72 hours. Thyroid  Function Tests: No results for input(s): TSH, T4TOTAL, FREET4, T3FREE, THYROIDAB in the last 72 hours. Anemia Panel: No results for input(s): VITAMINB12, FOLATE, FERRITIN, TIBC, IRON , RETICCTPCT in the last 72 hours. Sepsis Labs: Recent Labs  Lab 06/15/24 1224 06/15/24 1248   PROCALCITON 2.23  --   LATICACIDVEN  --  1.8    Recent Results (from the past 240 hours)  Resp panel by RT-PCR (RSV, Flu A&B, Covid) Anterior Nasal Swab     Status: None   Collection Time: 06/15/24 12:24 PM   Specimen: Anterior Nasal Swab  Result Value Ref Range Status   SARS Coronavirus 2 by RT PCR NEGATIVE NEGATIVE Final    Comment: (NOTE) SARS-CoV-2 target nucleic acids are NOT DETECTED.  The SARS-CoV-2 RNA is generally detectable in upper respiratory specimens during the acute phase of infection. The lowest concentration of SARS-CoV-2 viral copies this assay can detect is 138 copies/mL. A negative result does not preclude SARS-Cov-2 infection and should not be used as the sole basis for treatment or other patient management decisions. A negative result may occur with  improper specimen collection/handling, submission of specimen other than nasopharyngeal swab, presence of viral mutation(s) within the areas targeted by this assay, and inadequate number of viral copies(<138 copies/mL). A negative result must be combined with clinical observations, patient history, and epidemiological information. The expected result is Negative.  Fact Sheet for Patients:  BloggerCourse.com  Fact Sheet for Healthcare Providers:  SeriousBroker.it  This test is no t yet approved or cleared by the [place]  FDA and  has been authorized for detection and/or diagnosis of SARS-CoV-2 by FDA under an Emergency Use Authorization (EUA). This EUA will remain  in effect (meaning this test can be used) for the duration of the COVID-19 declaration under Section 564(b)(1) of the Act, 21 U.S.C.section 360bbb-3(b)(1), unless the authorization is terminated  or revoked sooner.       Influenza A by PCR NEGATIVE NEGATIVE Final   Influenza B by PCR NEGATIVE NEGATIVE Final    Comment: (NOTE) The Xpert Xpress SARS-CoV-2/FLU/RSV plus assay is intended as an  aid in the diagnosis of influenza from Nasopharyngeal swab specimens and should not be used as a sole basis for treatment. Nasal washings and aspirates are unacceptable for Xpert Xpress SARS-CoV-2/FLU/RSV testing.  Fact Sheet for Patients: BloggerCourse.com  Fact Sheet for Healthcare Providers: SeriousBroker.it  This test is not yet approved or cleared by the [place]  FDA and has been authorized for detection and/or diagnosis of SARS-CoV-2 by FDA under an Emergency Use Authorization (EUA). This EUA will remain in effect (meaning this test can be used) for the duration of the COVID-19 declaration under Section 564(b)(1) of the Act, 21 U.S.C. section 360bbb-3(b)(1), unless the authorization is terminated or revoked.     Resp Syncytial Virus by PCR NEGATIVE NEGATIVE  Final    Comment: (NOTE) Fact Sheet for Patients: BloggerCourse.com  Fact Sheet for Healthcare Providers: SeriousBroker.it  This test is not yet approved or cleared by the [place]  FDA and has been authorized for detection and/or diagnosis of SARS-CoV-2 by FDA under an Emergency Use Authorization (EUA). This EUA will remain in effect (meaning this test can be used) for the duration of the COVID-19 declaration under Section 564(b)(1) of the Act, 21 U.S.C. section 360bbb-3(b)(1), unless the authorization is terminated or revoked.  Performed at [facility], [address], [place], [place] [address]   Urine Culture     Status: Abnormal   Collection Time: 06/15/24 12:25 PM   Specimen: Urine, Random  Result Value Ref Range Status   Specimen Description   Final    URINE, RANDOM Performed at [facility], [address], [place], [place] [address]    Special Requests   Final    NONE Reflexed from [id number] Performed at [facility], [address]  [place], [place] [address]    Culture >=100,000 COLONIES/mL PROTEUS MIRABILIS (A)  Final   Report Status 06/18/2024 FINAL  Final   Organism ID, Bacteria PROTEUS MIRABILIS (A)  Final      Susceptibility   Proteus mirabilis - MIC*    AMPICILLIN  <=2 SENSITIVE Sensitive     CEFAZOLIN  (URINE) Value in next row Sensitive      4 SENSITIVEThis is a modified FDA-approved test that has been validated and its performance characteristics determined by the reporting laboratory.  This laboratory is certified under the Clinical Laboratory Improvement Amendments CLIA as qualified to perform high complexity clinical laboratory testing.    CEFEPIME  Value in next row Sensitive      4 SENSITIVEThis is a modified FDA-approved test that has been validated and its performance characteristics determined by the reporting laboratory.  This laboratory is certified under the Clinical Laboratory Improvement Amendments CLIA as qualified to perform high complexity clinical laboratory testing.    ERTAPENEM Value in next row Sensitive      4 SENSITIVEThis is a modified FDA-approved test that has been validated and its performance characteristics determined by the reporting laboratory.  This laboratory is certified under the Clinical Laboratory Improvement Amendments CLIA as qualified to perform high complexity clinical laboratory testing.    CEFTRIAXONE  Value in next row Sensitive      4 SENSITIVEThis is a modified FDA-approved test that has been validated and its performance characteristics determined by the reporting laboratory.  This laboratory is certified under the Clinical Laboratory Improvement Amendments CLIA as qualified to perform high complexity clinical laboratory testing.    CIPROFLOXACIN  Value in next row Sensitive      4 SENSITIVEThis is a modified FDA-approved test that has been validated and its performance characteristics determined by the reporting laboratory.  This laboratory is certified under the Clinical Laboratory  Improvement Amendments CLIA as qualified to perform high complexity clinical laboratory testing.    GENTAMICIN  Value in next row Sensitive      4 SENSITIVEThis is a modified FDA-approved test that has been validated and its performance characteristics determined by the reporting laboratory.  This laboratory is certified under the Clinical Laboratory Improvement Amendments CLIA as qualified to perform high complexity clinical laboratory testing.    NITROFURANTOIN Value in next row Resistant      4 SENSITIVEThis is a modified FDA-approved test that has been validated and its performance characteristics determined by the reporting laboratory.  This laboratory  is certified under the Clinical Laboratory Improvement Amendments CLIA as qualified to perform high complexity clinical laboratory testing.    TRIMETH /SULFA  Value in next row Resistant      4 SENSITIVEThis is a modified FDA-approved test that has been validated and its performance characteristics determined by the reporting laboratory.  This laboratory is certified under the Clinical Laboratory Improvement Amendments CLIA as qualified to perform high complexity clinical laboratory testing.    AMPICILLIN /SULBACTAM Value in next row Sensitive      4 SENSITIVEThis is a modified FDA-approved test that has been validated and its performance characteristics determined by the reporting laboratory.  This laboratory is certified under the Clinical Laboratory Improvement Amendments CLIA as qualified to perform high complexity clinical laboratory testing.    PIP/TAZO Value in next row Sensitive      <=4 SENSITIVEThis is a modified FDA-approved test that has been validated and its performance characteristics determined by the reporting laboratory.  This laboratory is certified under the Clinical Laboratory Improvement Amendments CLIA as qualified to perform high complexity clinical laboratory testing.    MEROPENEM  Value in next row Sensitive      <=4 SENSITIVEThis  is a modified FDA-approved test that has been validated and its performance characteristics determined by the reporting laboratory.  This laboratory is certified under the Clinical Laboratory Improvement Amendments CLIA as qualified to perform high complexity clinical laboratory testing.    * >=100,000 COLONIES/mL PROTEUS MIRABILIS  Blood Culture (routine x 2)     Status: None (Preliminary result)   Collection Time: 06/15/24 12:26 PM   Specimen: BLOOD LEFT FOREARM  Result Value Ref Range Status   Specimen Description   Final    BLOOD LEFT FOREARM Performed at [facility], [address], [place], [place] [address]    Special Requests   Final    BOTTLES DRAWN AEROBIC AND ANAEROBIC Blood Culture adequate volume Performed at [facility], [address], [place], [place] [address]    Culture   Final    NO GROWTH 2 DAYS Performed at [facility], [address] [place], [place] [address]    Report Status PENDING  Incomplete  Blood Culture (routine x 2)     Status: Abnormal   Collection Time: 06/15/24 12:27 PM   Specimen: BLOOD RIGHT FOREARM  Result Value Ref Range Status   Specimen Description   Final    BLOOD RIGHT FOREARM Performed at [facility], [address], [place], [place] [address]    Special Requests   Final    BOTTLES DRAWN AEROBIC AND ANAEROBIC Blood Culture adequate volume Performed at [facility], [address] [place], [place] [address]    Culture  Setup Time   Final    GRAM POSITIVE COCCI ANAEROBIC BOTTLE ONLY CRITICAL RESULT CALLED TO, READ BACK BY AND VERIFIED WITH: LEANNE POINDEXTER ON 06/16/24 AT 0629 QSD    Culture (A)  Final    STAPHYLOCOCCUS WARNERI THE SIGNIFICANCE OF ISOLATING THIS ORGANISM FROM A SINGLE SET OF BLOOD CULTURES WHEN MULTIPLE SETS ARE DRAWN IS UNCERTAIN. PLEASE NOTIFY THE MICROBIOLOGY DEPARTMENT WITHIN ONE WEEK IF SPECIATION AND SENSITIVITIES ARE REQUIRED. Performed at [facility], [address], [place], [place] [address]    Report Status 06/18/2024 FINAL  Final  Blood Culture ID Panel (Reflexed)     Status: Abnormal   Collection Time: 06/15/24 12:27 PM  Result Value Ref Range Status   Enterococcus faecalis NOT DETECTED  NOT DETECTED Final   Enterococcus Faecium NOT DETECTED NOT DETECTED Final   Listeria monocytogenes NOT DETECTED NOT DETECTED Final   Staphylococcus species DETECTED (A) NOT DETECTED Final    Comment: CRITICAL RESULT CALLED TO, READ BACK BY AND VERIFIED WITH: LEANNE POINDEXTER ON 06/16/24 AT 9370 QSD    Staphylococcus aureus (BCID) NOT DETECTED NOT DETECTED Final   Staphylococcus epidermidis NOT DETECTED NOT DETECTED Final   Staphylococcus lugdunensis NOT DETECTED NOT DETECTED Final   Streptococcus species NOT DETECTED NOT DETECTED Final   Streptococcus agalactiae NOT DETECTED NOT DETECTED Final   Streptococcus pneumoniae NOT DETECTED NOT DETECTED Final   Streptococcus pyogenes NOT DETECTED NOT DETECTED Final   A.calcoaceticus-baumannii NOT DETECTED NOT DETECTED Final   Bacteroides fragilis NOT DETECTED NOT DETECTED Final   Enterobacterales NOT DETECTED NOT DETECTED Final   Enterobacter cloacae complex NOT DETECTED NOT DETECTED Final   Escherichia coli NOT DETECTED NOT DETECTED Final   Klebsiella aerogenes NOT DETECTED NOT DETECTED Final   Klebsiella oxytoca NOT DETECTED NOT DETECTED Final   Klebsiella pneumoniae NOT DETECTED NOT DETECTED Final   Proteus species NOT DETECTED NOT DETECTED Final   Salmonella species NOT DETECTED NOT DETECTED Final   Serratia marcescens NOT DETECTED NOT DETECTED Final   Haemophilus influenzae NOT DETECTED NOT DETECTED Final   Neisseria meningitidis NOT DETECTED NOT DETECTED Final   Pseudomonas aeruginosa NOT DETECTED NOT DETECTED Final   Stenotrophomonas maltophilia NOT DETECTED NOT DETECTED Final   Candida albicans NOT DETECTED NOT DETECTED Final   Candida auris NOT DETECTED NOT DETECTED Final    Candida glabrata NOT DETECTED NOT DETECTED Final   Candida krusei NOT DETECTED NOT DETECTED Final   Candida parapsilosis NOT DETECTED NOT DETECTED Final   Candida tropicalis NOT DETECTED NOT DETECTED Final   Cryptococcus neoformans/gattii NOT DETECTED NOT DETECTED Final    Comment: Performed at [facility], [address] [place], [place] [address]  MRSA Next Gen by PCR, Nasal     Status: Abnormal   Collection Time: 06/16/24  4:42 AM   Specimen: Nasal Mucosa; Nasal Swab  Result Value Ref Range Status   MRSA by PCR Next Gen DETECTED (A) NOT DETECTED Final    Comment: Results Called to: [employer], J RN AT 1107 ON 06/16/2024 BY PRUDY, K (NOTE) The GeneXpert MRSA Assay (FDA approved for NASAL specimens only), is one component of a comprehensive MRSA colonization surveillance program. It is not intended to diagnose MRSA infection nor to guide or monitor treatment for MRSA infections. Test performance is not FDA approved in patients less than 56 years old. Performed at [facility], [address] [place], [place] [address]   Aerobic/Anaerobic Culture w Gram Stain (surgical/deep wound)     Status: None (Preliminary result)   Collection Time: 06/18/24 10:40 AM   Specimen: Wound; Abscess  Result Value Ref Range Status   Specimen Description   Final    WOUND Performed at [facility], [address], [place], [place] [address]    Special Requests   Final    LEFT POSTERIOR THIGH MASS Performed at [facility], [address] [place], [place] [address]    Gram Stain   Final    MODERATE WBC PRESENT, PREDOMINANTLY PMN RARE GRAM POSITIVE COCCI IN CLUSTERS RARE GRAM NEGATIVE RODS Performed at [facility], [address] [place], [place] [address]    Culture PENDING  Incomplete   Report Status PENDING  Incomplete  Radiology Studies: No results found.      Scheduled Meds:  artificial tears  1  drop Both Eyes Daily   atorvastatin   40 mg Oral QHS   azelastine  2 spray Each Nare Daily   baclofen   5 mg Oral TID   Chlorhexidine  Gluconate Cloth  6 each Topical Daily   cholecalciferol   2,000 Units Oral Daily   DULoxetine   30 mg Oral QHS   enoxaparin  (LOVENOX ) injection  40 mg Subcutaneous QHS   famotidine   20 mg Oral BID   ferrous sulfate   325 mg Oral Q breakfast   fluticasone   1 spray Each Nare QHS   insulin  aspart  0-15 Units Subcutaneous TID WC   insulin  aspart  0-5 Units Subcutaneous QHS   insulin  glargine  7 Units Subcutaneous QHS   lamoTRIgine   50 mg Oral BID   loratadine   10 mg Oral Daily   midodrine   10 mg Oral TID WC   mupirocin  ointment  1 Application Nasal BID   olopatadine   1 drop Both Eyes BID   senna-docusate  1 tablet Oral BID   sodium hypochlorite   Irrigation Q12H   traZODone   100 mg Oral QHS   Continuous Infusions:  ceFEPime  (MAXIPIME ) IV 2 g (06/18/24 2128)   vancomycin  1,250 mg (06/18/24 1627)     LOS: 4 days    Time spent: 35 Minutes    Tamaiya Bump A Kellis Topete, MD [facility]   If 7PM-7AM, please contact night-coverage [email or web address]  06/19/2024, 7:35 AM

## 2024-06-20 DIAGNOSIS — A419 Sepsis, unspecified organism: Secondary | ICD-10-CM | POA: Diagnosis not present

## 2024-06-20 DIAGNOSIS — R6520 Severe sepsis without septic shock: Secondary | ICD-10-CM | POA: Diagnosis not present

## 2024-06-20 LAB — CBC
HCT: 29.8 % — ABNORMAL LOW (ref 39.0–52.0)
Hemoglobin: 8.7 g/dL — ABNORMAL LOW (ref 13.0–17.0)
MCH: 24.4 pg — ABNORMAL LOW (ref 26.0–34.0)
MCHC: 29.2 g/dL — ABNORMAL LOW (ref 30.0–36.0)
MCV: 83.7 fL (ref 80.0–100.0)
Platelets: 376 K/uL (ref 150–400)
RBC: 3.56 MIL/uL — ABNORMAL LOW (ref 4.22–5.81)
RDW: 17.9 % — ABNORMAL HIGH (ref 11.5–15.5)
WBC: 9.5 K/uL (ref 4.0–10.5)
nRBC: 0 % (ref 0.0–0.2)

## 2024-06-20 LAB — RENAL FUNCTION PANEL
Albumin: 2.1 g/dL — ABNORMAL LOW (ref 3.5–5.0)
Anion gap: 10 (ref 5–15)
BUN: 14 mg/dL (ref 8–23)
CO2: 17 mmol/L — ABNORMAL LOW (ref 22–32)
Calcium: 8.4 mg/dL — ABNORMAL LOW (ref 8.9–10.3)
Chloride: 111 mmol/L (ref 98–111)
Creatinine, Ser: 0.49 mg/dL — ABNORMAL LOW (ref 0.61–1.24)
GFR, Estimated: >60 mL/min (ref >60)
Glucose, Bld: 257 mg/dL — ABNORMAL HIGH (ref 70–99)
Phosphorus: 2.7 mg/dL (ref 2.5–4.6)
Potassium: 2.9 mmol/L — ABNORMAL LOW (ref 3.5–5.1)
Sodium: 137 mmol/L (ref 135–145)

## 2024-06-20 LAB — GLUCOSE, CAPILLARY
Glucose-Capillary: 255 mg/dL — ABNORMAL HIGH (ref 70–99)
Glucose-Capillary: 293 mg/dL — ABNORMAL HIGH (ref 70–99)
Glucose-Capillary: 320 mg/dL — ABNORMAL HIGH (ref 70–99)
Glucose-Capillary: 314 mg/dL — ABNORMAL HIGH (ref 70–99)
Glucose-Capillary: 254 mg/dL — ABNORMAL HIGH (ref 70–99)

## 2024-06-20 MED ORDER — SODIUM CHLORIDE 0.9 % IV SOLN
3.0000 g | Freq: Four times a day (QID) | INTRAVENOUS | Status: DC
  Administered 2024-06-20 – 2024-06-21 (×4): 3 g via INTRAVENOUS
  Filled 2024-06-20 (×5): qty 8

## 2024-06-20 MED ORDER — POTASSIUM CHLORIDE 10 MEQ/100ML IV SOLN
10.0000 meq | INTRAVENOUS | Status: AC
Start: 2024-06-20 — End: 2024-06-22
  Administered 2024-06-20 (×2): 10 meq via INTRAVENOUS
  Filled 2024-06-20 (×2): qty 100

## 2024-06-20 MED ORDER — NYSTATIN 100000 UNIT/ML MT SUSP
5.0000 mL | Freq: Four times a day (QID) | OROMUCOSAL | Status: DC
  Administered 2024-06-20 – 2024-06-23 (×12): 500000 [IU] via ORAL
  Filled 2024-06-20 (×13): qty 5

## 2024-06-20 MED ORDER — POTASSIUM CHLORIDE CRYS ER 20 MEQ PO TBCR
40.0000 meq | EXTENDED_RELEASE_TABLET | Freq: Once | ORAL | Status: AC
  Administered 2024-06-20: 40 meq via ORAL
  Filled 2024-06-20: qty 2

## 2024-06-20 MED ORDER — POTASSIUM CHLORIDE 10 MEQ/100ML IV SOLN
10.0000 meq | INTRAVENOUS | Status: AC
  Administered 2024-06-20 (×2): 10 meq via INTRAVENOUS
  Filled 2024-06-20: qty 100

## 2024-06-20 MED ORDER — SODIUM CHLORIDE 0.9 % IV SOLN
100.0000 mg | Freq: Two times a day (BID) | INTRAVENOUS | Status: DC
  Administered 2024-06-20 – 2024-06-21 (×3): 100 mg via INTRAVENOUS
  Filled 2024-06-20 (×3): qty 100

## 2024-06-20 MED ORDER — BACLOFEN 10 MG PO TABS: 5.0000 mg | ORAL_TABLET | Freq: Three times a day (TID) | ORAL | Status: DC | PRN

## 2024-06-20 MED ORDER — MEGESTROL ACETATE 40 MG PO TABS
40.0000 mg | ORAL_TABLET | Freq: Every day | ORAL | Status: DC
  Administered 2024-06-20 – 2024-06-24 (×5): 40 mg via ORAL
  Filled 2024-06-20 (×5): qty 1

## 2024-06-20 NOTE — TOC Progression Note (Addendum)
 Transition of Care (TOC) - Progression Note    Patient Details  Name: Brent [name]. MRN: [id number] Date of Birth: [birth year]  Transition of Care [facility]) CM/SW Contact  Jadin Kagel, Nathanel, RN Phone Number: 06/20/2024, 11:22 AM  Clinical Narrative:   Just spoke to spouse Brent [name] has many questions & wants to talk to attending about not eating, not taking pills, wound care,etc. I just informed facility of her wound care concerns on weekends-they may not have ability to provide wound care nurse on weekends.For return back to [facility] will inform the DON & wound care nurse-await outcome.Per facility-they have a WOCN on weekends.     Expected Discharge Plan: Long Term Nursing Home Barriers to Discharge: Continued Medical Work up               Expected Discharge Plan and Services   Discharge Planning Services: CM Consult Post Acute Care Choice: Skilled Nursing Facility Living arrangements for the past 2 months: Skilled Nursing Facility                                       Social Drivers of Health (SDOH) Interventions SDOH Screenings   Food Insecurity: No Food Insecurity (06/15/2024)  Housing: Low Risk  (06/15/2024)  Transportation Needs: No Transportation Needs (06/15/2024)  Utilities: Not At Risk (06/15/2024)  Depression (PHQ2-9): Low Risk  (12/21/2023)  Financial Resource Strain: Low Risk  (03/06/2020)  Physical Activity: Inactive (12/14/2018)  Social Connections: Socially Isolated (06/15/2024)  Stress: No Stress Concern Present (12/14/2018)  Tobacco Use: Medium Risk (06/18/2024)    Readmission Risk Interventions    07/31/2023    9:17 AM 07/02/2023   12:31 PM  Readmission Risk Prevention Plan  Transportation Screening Complete Complete  Medication Review (RN Care Manager) Complete Complete  PCP or Specialist appointment within 3-5 days of discharge Complete Not Complete  PCP/Specialist Appt Not Complete comments  SNF resident  HRI or Home Care  Consult Complete Complete  SW Recovery Care/Counseling Consult Complete Complete  Palliative Care Screening Not Applicable Not Applicable  Skilled Nursing Facility Complete Complete

## 2024-06-20 NOTE — Progress Notes (Signed)
 PROGRESS NOTE    Brent [name].  FMW:[id number] DOB: Sep 16, 1946 DOA: 06/15/2024 PCP: Feliciano Devoria LABOR, MD   Brief Narrative: 78 year old with past medical history significant for CAD status post DES, diabetes type 2, hypertension, hyperlipidemia, history of melanoma, OSA, transfer myelitis, stage IV sacral decubitus ulcer, history of chronic OM, colostomy and suprapubic catheter presents to the ED with concern of worsening decubitus ulcer and complaints of fatigue.  He has had a stage IV ulcers for 4-year, wound appears to be getting worse,  he presented to the ED.  He was also noted to be confused at rehab.  Patient has had poor oral intake and nausea over the last few weeks.  CT abdomen and pelvis showed large decubitus ulcer overlying the sacrum and bilateral ischial tuberosity.  Finding compatible with acute osteomyelitis within the left ischium and chronic osteomyelitis within the right ischium.  New 4 x 6 cm complex fluid collection within the subcutaneous tissue posterior to the left thigh, immediately inferior to the left ischial decubitus ulcer consistent with abscess.  Surgery consulted and patient underwent wound exploration with incision and drainage and open packing of the left posterior thigh abscess on 9/30 by Dr. Krystal Bergamo.     Assessment & Plan:   Principal Problem:   Severe sepsis secondary to acute OM of left ischium and posterior thigh abscess Active Problems:   AKI (acute kidney injury)   DM type 2 (diabetes mellitus, type 2) (HCC)   (HFpEF) heart failure with preserved ejection fraction (HCC)   Essential hypertension   Coronary artery disease involving native coronary artery of native heart without angina pectoris   Normocytic anemia   Neurogenic bladder   Hyperlipidemia   GERD (gastroesophageal reflux disease)   Colostomy present (HCC)   Chronic osteomyelitis of pelvic region, right (HCC)   Obstructive sleep apnea   Thigh abscess   Acute osteomyelitis  involving pelvic region and thigh, left (HCC)   Wound infection   1-Severe sepsis secondary to Acute osteomyelitis of the left ischium and posterior thigh abscess Staph warnery positive Blood culture, thought to be contaminate.  Stage IV right and left lower mid upper decubitus ulcer and left thigh pressure posterior abscess -Presented with altered mental status, leukocytosis, hypotension, AKI, tachypnea source of infection decubitus ulcer - Continue with IV antibiotics, patient refusing oral medication at times - Underwent wound exploration with incision and drainage and open packing active left posterior thigh abscess by surgery on 9/30. - Transition to oral Doxy and Augmentin  for 7 days when tolerating orals -Follow wound culture results: rare proteus mirabilis.   AKI - Creatinine baseline 0.8.  Presented with a creatinine of 1.4 - Improved with fluids. Received IV fluids.   Hypokalemia: Replete IV and oral.   UTI: Urine culture growing 100,000 colonies of Proteus mirabilis - On IV Unasyn    Diabetes type 2: Hyperglycemia - Continue to hold metformin  -A1c 7.1 Sliding scale insulin  on Semglee  and  Chronic diastolic heart failure preserved ejection fraction - Holding Lasix  for oral intake. -  Essential hypertension: - Hold Lasix  and antihypertensive  Coronary artery disease involving native coronary artery of native heart without angina pectoris - Stable  Acute on chronic normocytic anemia - Questionable ACD with chronic OM Baseline hemoglobin 9-10 Component  of iron  deficiency anemia Received 1 unit of packed red blood cell  Neurogenic bladder Suprapubic catheter, continue foley care    Hyperlipidemia Continue lipitor 40mg  daily    GERD (gastroesophageal reflux disease) Continue pepcid  BID  Colostomy present [facility]) Colostomy care    Chronic osteomyelitis of pelvic region, right [facility]) Seen by ID in April of 2025      Obstructive sleep apnea States he no  longer uses cpap at night    FTT; poor oral intake  In setting acute illness and delirium.  Continue Glucerna.  Star Megace.   Delirium; acute encephalopathy He has been more sleepy.  Suspect post surgery, anesthesia, infection process.  Will change baclofen  to PRN.   See wound care documentation below Wound 06/15/24 2300 Pressure Injury Buttocks Right;Left;Lower;Mid;Upper Stage 4 - Full thickness tissue loss with exposed bone, tendon or muscle. (Active)     Wound 06/15/24 2300 Pressure Injury Thigh Left;Posterior;Proximal (Active)     Estimated body mass index is 20.34 kg/m as calculated from the following:   Height as of this encounter: 6' (1.829 m).   Weight as of this encounter: 68 kg.   DVT prophylaxis: Lovenox  Code Status: Full code Family Communication: Wife updated.  Disposition Plan:  Status is: Inpatient Remains inpatient appropriate because: Management of wound infection    Consultants:  General surgery  Procedures:  none  Antimicrobials:    Subjective: Alert, denies pain. Report poor appetite     Objective: Vitals:   06/19/24 0536 06/19/24 0905 06/19/24 1933 06/20/24 0444  BP: (!) 131/51 (!) 140/54 (!) 140/70 (!) 102/51  Pulse: (!) 58 79 63 (!) 52  Resp: 15 18 15 17   Temp: 97.6 F (36.4 C)  98.8 F (37.1 C) 98 F (36.7 C)  TempSrc:      SpO2: 100% 100% 98% 99%  Weight:      Height:        Intake/Output Summary (Last 24 hours) at 06/20/2024 0737 Last data filed at 06/20/2024 0500 Gross per 24 hour  Intake 2085.55 ml  Output 1150 ml  Net 935.55 ml   Filed Weights   06/15/24 1221 06/18/24 0829  Weight: 68 kg 68 kg    Examination:  General exam: alert, on rounds.  Respiratory system: CTA Cardiovascular system: S 1, S 2 RRR Gastrointestinal system: Abdomen is nondistended, soft and nontender. Colostomy in place, supra-pubic catheter in place.  Central nervous system: Alert     Data Reviewed: I have personally reviewed  following labs and imaging studies  CBC: Recent Labs  Lab 06/15/24 1224 06/16/24 0438 06/18/24 0555 06/18/24 1342 06/19/24 0458 06/20/24 0601  WBC 23.9* 17.2* 11.4* 12.2* 11.9* 9.5  NEUTROABS 20.1*  --   --   --   --   --   HGB 8.6* 7.5* 7.8* 7.2* 9.1* 8.7*  HCT 28.8* 26.9* 26.8* 25.9* 30.4* 29.8*  MCV 78.5* 80.1 80.0 81.2 82.4 83.7  PLT 450* 384 359 369 394 376   Basic Metabolic Panel: Recent Labs  Lab 06/15/24 1224 06/16/24 0438 06/18/24 0555 06/19/24 0458 06/20/24 0601  NA 131* 135 136 138 137  K 4.2 3.7 3.3* 3.0* 2.9*  CL 99 105 107 109 111  CO2 20* 19* 18* 18* 17*  GLUCOSE 153* 105* 271* 189* 257*  BUN 32* 23 27* 19 14  CREATININE 1.41* 0.97 0.63 0.56* 0.49*  CALCIUM  8.9 8.4* 8.7* 8.5* 8.4*  PHOS  --   --  3.4 2.9 2.7   GFR: Estimated Creatinine Clearance: 72 mL/min (A) (by C-G formula based on SCr of 0.49 mg/dL (L)). Liver Function Tests: Recent Labs  Lab 06/15/24 1224 06/16/24 0438 06/18/24 0555 06/19/24 0458 06/20/24 0601  AST 40 35  --   --   --  ALT 48* 37  --   --   --   ALKPHOS 157* 159*  --   --   --   BILITOT 0.5 0.2  --   --   --   PROT 6.7 5.9*  --   --   --   ALBUMIN  2.7* 2.3* 2.4* 2.2* 2.1*   No results for input(s): LIPASE, AMYLASE in the last 168 hours. No results for input(s): AMMONIA in the last 168 hours. Coagulation Profile: Recent Labs  Lab 06/15/24 1224  INR 1.3*   Cardiac Enzymes: No results for input(s): CKTOTAL, CKMB, CKMBINDEX, TROPONINI in the last 168 hours. BNP (last 3 results) No results for input(s): PROBNP in the last 8760 hours. HbA1C: No results for input(s): HGBA1C in the last 72 hours. CBG: Recent Labs  Lab 06/19/24 1140 06/19/24 1414 06/19/24 1643 06/19/24 2005 06/20/24 0730  GLUCAP 321* 282* 270* 298* 255*   Lipid Profile: No results for input(s): CHOL, HDL, LDLCALC, TRIG, CHOLHDL, LDLDIRECT in the last 72 hours. Thyroid  Function Tests: No results for input(s):  TSH, T4TOTAL, FREET4, T3FREE, THYROIDAB in the last 72 hours. Anemia Panel: No results for input(s): VITAMINB12, FOLATE, FERRITIN, TIBC, IRON , RETICCTPCT in the last 72 hours. Sepsis Labs: Recent Labs  Lab 06/15/24 1224 06/15/24 1248  PROCALCITON 2.23  --   LATICACIDVEN  --  1.8    Recent Results (from the past 240 hours)  Resp panel by RT-PCR (RSV, Flu A&B, Covid) Anterior Nasal Swab     Status: None   Collection Time: 06/15/24 12:24 PM   Specimen: Anterior Nasal Swab  Result Value Ref Range Status   SARS Coronavirus 2 by RT PCR NEGATIVE NEGATIVE Final    Comment: (NOTE) SARS-CoV-2 target nucleic acids are NOT DETECTED.  The SARS-CoV-2 RNA is generally detectable in upper respiratory specimens during the acute phase of infection. The lowest concentration of SARS-CoV-2 viral copies this assay can detect is 138 copies/mL. A negative result does not preclude SARS-Cov-2 infection and should not be used as the sole basis for treatment or other patient management decisions. A negative result may occur with  improper specimen collection/handling, submission of specimen other than nasopharyngeal swab, presence of viral mutation(s) within the areas targeted by this assay, and inadequate number of viral copies(<138 copies/mL). A negative result must be combined with clinical observations, patient history, and epidemiological information. The expected result is Negative.  Fact Sheet for Patients:  BloggerCourse.com  Fact Sheet for Healthcare Providers:  SeriousBroker.it  This test is no t yet approved or cleared by the [place]  FDA and  has been authorized for detection and/or diagnosis of SARS-CoV-2 by FDA under an Emergency Use Authorization (EUA). This EUA will remain  in effect (meaning this test can be used) for the duration of the COVID-19 declaration under Section 564(b)(1) of the Act,  21 U.S.C.section 360bbb-3(b)(1), unless the authorization is terminated  or revoked sooner.       Influenza A by PCR NEGATIVE NEGATIVE Final   Influenza B by PCR NEGATIVE NEGATIVE Final    Comment: (NOTE) The Xpert Xpress SARS-CoV-2/FLU/RSV plus assay is intended as an aid in the diagnosis of influenza from Nasopharyngeal swab specimens and should not be used as a sole basis for treatment. Nasal washings and aspirates are unacceptable for Xpert Xpress SARS-CoV-2/FLU/RSV testing.  Fact Sheet for Patients: BloggerCourse.com  Fact Sheet for Healthcare Providers: SeriousBroker.it  This test is not yet approved or cleared by the [place]  FDA and has been authorized for  detection and/or diagnosis of SARS-CoV-2 by FDA under an Emergency Use Authorization (EUA). This EUA will remain in effect (meaning this test can be used) for the duration of the COVID-19 declaration under Section 564(b)(1) of the Act, 21 U.S.C. section 360bbb-3(b)(1), unless the authorization is terminated or revoked.     Resp Syncytial Virus by PCR NEGATIVE NEGATIVE Final    Comment: (NOTE) Fact Sheet for Patients: BloggerCourse.com  Fact Sheet for Healthcare Providers: SeriousBroker.it  This test is not yet approved or cleared by the [place]  FDA and has been authorized for detection and/or diagnosis of SARS-CoV-2 by FDA under an Emergency Use Authorization (EUA). This EUA will remain in effect (meaning this test can be used) for the duration of the COVID-19 declaration under Section 564(b)(1) of the Act, 21 U.S.C. section 360bbb-3(b)(1), unless the authorization is terminated or revoked.  Performed at [facility], [address], [place], [place] [address]   Urine Culture     Status: Abnormal   Collection Time: 06/15/24 12:25 PM   Specimen: Urine, Random  Result Value Ref  Range Status   Specimen Description   Final    URINE, RANDOM Performed at [facility], [address], [place], [place] [address]    Special Requests   Final    NONE Reflexed from [phone] Performed at [facility], [address] [place], [place] [address]    Culture >=100,000 COLONIES/mL PROTEUS MIRABILIS (A)  Final   Report Status 06/18/2024 FINAL  Final   Organism ID, Bacteria PROTEUS MIRABILIS (A)  Final      Susceptibility   Proteus mirabilis - MIC*    AMPICILLIN  <=2 SENSITIVE Sensitive     CEFAZOLIN  (URINE) Value in next row Sensitive      4 SENSITIVEThis is a modified FDA-approved test that has been validated and its performance characteristics determined by the reporting laboratory.  This laboratory is certified under the Clinical Laboratory Improvement Amendments CLIA as qualified to perform high complexity clinical laboratory testing.    CEFEPIME  Value in next row Sensitive      4 SENSITIVEThis is a modified FDA-approved test that has been validated and its performance characteristics determined by the reporting laboratory.  This laboratory is certified under the Clinical Laboratory Improvement Amendments CLIA as qualified to perform high complexity clinical laboratory testing.    ERTAPENEM Value in next row Sensitive      4 SENSITIVEThis is a modified FDA-approved test that has been validated and its performance characteristics determined by the reporting laboratory.  This laboratory is certified under the Clinical Laboratory Improvement Amendments CLIA as qualified to perform high complexity clinical laboratory testing.    CEFTRIAXONE  Value in next row Sensitive      4 SENSITIVEThis is a modified FDA-approved test that has been validated and its performance characteristics determined by the reporting laboratory.  This laboratory is certified under the Clinical Laboratory Improvement Amendments CLIA as qualified to perform high complexity  clinical laboratory testing.    CIPROFLOXACIN  Value in next row Sensitive      4 SENSITIVEThis is a modified FDA-approved test that has been validated and its performance characteristics determined by the reporting laboratory.  This laboratory is certified under the Clinical Laboratory Improvement Amendments CLIA as qualified to perform high complexity clinical laboratory testing.    GENTAMICIN  Value in next row Sensitive      4 SENSITIVEThis is a modified FDA-approved test that has been validated and its performance characteristics determined by the  reporting laboratory.  This laboratory is certified under the Clinical Laboratory Improvement Amendments CLIA as qualified to perform high complexity clinical laboratory testing.    NITROFURANTOIN Value in next row Resistant      4 SENSITIVEThis is a modified FDA-approved test that has been validated and its performance characteristics determined by the reporting laboratory.  This laboratory is certified under the Clinical Laboratory Improvement Amendments CLIA as qualified to perform high complexity clinical laboratory testing.    TRIMETH /SULFA  Value in next row Resistant      4 SENSITIVEThis is a modified FDA-approved test that has been validated and its performance characteristics determined by the reporting laboratory.  This laboratory is certified under the Clinical Laboratory Improvement Amendments CLIA as qualified to perform high complexity clinical laboratory testing.    AMPICILLIN /SULBACTAM Value in next row Sensitive      4 SENSITIVEThis is a modified FDA-approved test that has been validated and its performance characteristics determined by the reporting laboratory.  This laboratory is certified under the Clinical Laboratory Improvement Amendments CLIA as qualified to perform high complexity clinical laboratory testing.    PIP/TAZO Value in next row Sensitive      <=4 SENSITIVEThis is a modified FDA-approved test that has been validated and its  performance characteristics determined by the reporting laboratory.  This laboratory is certified under the Clinical Laboratory Improvement Amendments CLIA as qualified to perform high complexity clinical laboratory testing.    MEROPENEM  Value in next row Sensitive      <=4 SENSITIVEThis is a modified FDA-approved test that has been validated and its performance characteristics determined by the reporting laboratory.  This laboratory is certified under the Clinical Laboratory Improvement Amendments CLIA as qualified to perform high complexity clinical laboratory testing.    * >=100,000 COLONIES/mL PROTEUS MIRABILIS  Blood Culture (routine x 2)     Status: None (Preliminary result)   Collection Time: 06/15/24 12:26 PM   Specimen: BLOOD LEFT FOREARM  Result Value Ref Range Status   Specimen Description   Final    BLOOD LEFT FOREARM Performed at [facility], [address] [place], [place] [address]    Special Requests   Final    BOTTLES DRAWN AEROBIC AND ANAEROBIC Blood Culture adequate volume Performed at [facility], [address] [place], [place] [address]    Culture   Final    NO GROWTH 3 DAYS Performed at [facility], [address] [place], [place] [address]    Report Status PENDING  Incomplete  Blood Culture (routine x 2)     Status: Abnormal   Collection Time: 06/15/24 12:27 PM   Specimen: BLOOD RIGHT FOREARM  Result Value Ref Range Status   Specimen Description   Final    BLOOD RIGHT FOREARM Performed at [facility], [address], [place], [place] [address]    Special Requests   Final    BOTTLES DRAWN AEROBIC AND ANAEROBIC Blood Culture adequate volume Performed at [facility], [address], [place], [place] [address]    Culture  Setup Time   Final    GRAM POSITIVE COCCI ANAEROBIC BOTTLE ONLY CRITICAL RESULT CALLED TO, READ BACK BY AND VERIFIED WITH: LEANNE POINDEXTER ON 06/16/24 AT 0629 QSD    Culture  (A)  Final    STAPHYLOCOCCUS WARNERI THE SIGNIFICANCE OF ISOLATING THIS ORGANISM FROM A SINGLE SET OF BLOOD CULTURES WHEN MULTIPLE SETS ARE DRAWN IS UNCERTAIN. PLEASE NOTIFY THE MICROBIOLOGY DEPARTMENT WITHIN ONE  WEEK IF SPECIATION AND SENSITIVITIES ARE REQUIRED. Performed at [facility], [address] [place], [place] [address]    Report Status 06/18/2024 FINAL  Final  Blood Culture ID Panel (Reflexed)     Status: Abnormal   Collection Time: 06/15/24 12:27 PM  Result Value Ref Range Status   Enterococcus faecalis NOT DETECTED NOT DETECTED Final   Enterococcus Faecium NOT DETECTED NOT DETECTED Final   Listeria monocytogenes NOT DETECTED NOT DETECTED Final   Staphylococcus species DETECTED (A) NOT DETECTED Final    Comment: CRITICAL RESULT CALLED TO, READ BACK BY AND VERIFIED WITH: LEANNE POINDEXTER ON 06/16/24 AT 9370 QSD    Staphylococcus aureus (BCID) NOT DETECTED NOT DETECTED Final   Staphylococcus epidermidis NOT DETECTED NOT DETECTED Final   Staphylococcus lugdunensis NOT DETECTED NOT DETECTED Final   Streptococcus species NOT DETECTED NOT DETECTED Final   Streptococcus agalactiae NOT DETECTED NOT DETECTED Final   Streptococcus pneumoniae NOT DETECTED NOT DETECTED Final   Streptococcus pyogenes NOT DETECTED NOT DETECTED Final   A.calcoaceticus-baumannii NOT DETECTED NOT DETECTED Final   Bacteroides fragilis NOT DETECTED NOT DETECTED Final   Enterobacterales NOT DETECTED NOT DETECTED Final   Enterobacter cloacae complex NOT DETECTED NOT DETECTED Final   Escherichia coli NOT DETECTED NOT DETECTED Final   Klebsiella aerogenes NOT DETECTED NOT DETECTED Final   Klebsiella oxytoca NOT DETECTED NOT DETECTED Final   Klebsiella pneumoniae NOT DETECTED NOT DETECTED Final   Proteus species NOT DETECTED NOT DETECTED Final   Salmonella species NOT DETECTED NOT DETECTED Final   Serratia marcescens NOT DETECTED NOT DETECTED Final   Haemophilus influenzae NOT DETECTED NOT DETECTED  Final   Neisseria meningitidis NOT DETECTED NOT DETECTED Final   Pseudomonas aeruginosa NOT DETECTED NOT DETECTED Final   Stenotrophomonas maltophilia NOT DETECTED NOT DETECTED Final   Candida albicans NOT DETECTED NOT DETECTED Final   Candida auris NOT DETECTED NOT DETECTED Final   Candida glabrata NOT DETECTED NOT DETECTED Final   Candida krusei NOT DETECTED NOT DETECTED Final   Candida parapsilosis NOT DETECTED NOT DETECTED Final   Candida tropicalis NOT DETECTED NOT DETECTED Final   Cryptococcus neoformans/gattii NOT DETECTED NOT DETECTED Final    Comment: Performed at [facility], [address] [place], [place] [address]  MRSA Next Gen by PCR, Nasal     Status: Abnormal   Collection Time: 06/16/24  4:42 AM   Specimen: Nasal Mucosa; Nasal Swab  Result Value Ref Range Status   MRSA by PCR Next Gen DETECTED (A) NOT DETECTED Final    Comment: Results Called to: [employer], J RN AT 1107 ON 06/16/2024 BY PRUDY, K (NOTE) The GeneXpert MRSA Assay (FDA approved for NASAL specimens only), is one component of a comprehensive MRSA colonization surveillance program. It is not intended to diagnose MRSA infection nor to guide or monitor treatment for MRSA infections. Test performance is not FDA approved in patients less than 56 years old. Performed at [facility], [address], [place], [place] [address]   Aerobic/Anaerobic Culture w Gram Stain (surgical/deep wound)     Status: None (Preliminary result)   Collection Time: 06/18/24 10:40 AM   Specimen: Wound; Abscess  Result Value Ref Range Status   Specimen Description   Final    WOUND Performed at [facility], [address], [place], [place] [address]    Special Requests   Final    LEFT POSTERIOR THIGH MASS Performed at [facility], [address] [place], [place] [address]    Gram Stain   Final    MODERATE WBC PRESENT, PREDOMINANTLY PMN RARE GRAM POSITIVE  COCCI IN CLUSTERS RARE GRAM NEGATIVE RODS    Culture   Final    RARE PROTEUS MIRABILIS SUSCEPTIBILITIES TO FOLLOW Performed at [facility], [address] [place], [place] [address]    Report Status PENDING  Incomplete         Radiology Studies: No results found.      Scheduled Meds:  artificial tears  1 drop Both Eyes Daily   ascorbic acid   500 mg Oral Daily   atorvastatin   40 mg Oral QHS   azelastine  2 spray Each Nare Daily   baclofen   5 mg Oral TID   Chlorhexidine  Gluconate Cloth  6 each Topical Daily   cholecalciferol   2,000 Units Oral Daily   DULoxetine   30 mg Oral QHS   enoxaparin  (LOVENOX ) injection  40 mg Subcutaneous QHS   famotidine   20 mg Oral BID   feeding supplement (GLUCERNA SHAKE)  237 mL Oral TID BM   ferrous sulfate   325 mg Oral Q breakfast   fluticasone   1 spray Each Nare QHS   insulin  aspart  0-15 Units Subcutaneous TID WC   insulin  aspart  0-5 Units Subcutaneous QHS   insulin  glargine  7 Units Subcutaneous QHS   lamoTRIgine   50 mg Oral BID   loratadine   10 mg Oral Daily   midodrine   10 mg Oral TID WC   mupirocin  ointment  1 Application Nasal BID   olopatadine   1 drop Both Eyes BID   potassium chloride   40 mEq Oral Once   senna-docusate  1 tablet Oral BID   sodium hypochlorite   Irrigation Q12H   thiamine  100 mg Oral Daily   traZODone   100 mg Oral QHS   zinc  sulfate (50mg  elemental zinc )  220 mg Oral Daily   Continuous Infusions:  ceFEPime  (MAXIPIME ) IV 2 g (06/19/24 2208)   lactated ringers  75 mL/hr at 06/20/24 0617   potassium chloride      vancomycin  Stopped (06/19/24 1738)     LOS: 5 days    Time spent: 35 Minutes    Oanh Devivo A Adylynn Hertenstein, MD [facility]   If 7PM-7AM, please contact night-coverage [email or web address]  06/20/2024, 7:37 AM

## 2024-06-20 NOTE — Inpatient Diabetes Management (Signed)
 Inpatient Diabetes Program Recommendations  AACE/ADA: New Consensus Statement on Inpatient Glycemic Control (2015)  Target Ranges:  Prepandial:   less than 140 mg/dL      Peak postprandial:   less than 180 mg/dL (1-2 hours)      Critically ill patients:  140 - 180 mg/dL   Lab Results  Component Value Date   GLUCAP 293 (H) 06/20/2024   HGBA1C 7.1 (H) 06/15/2024    Review of Glycemic Control  Diabetes history: DM2 Outpatient Diabetes medications: Semglee  26 units BID, Novolog  6 units TID, metformin  500 mg BID  Current orders for Inpatient glycemic control:  Lantus  7 at bedtime, Novolog  0-15 TID with meals and 0-5 HS  Hypo of 38 yesterday am.  CBGs today: 255, 293  Inpatient Diabetes Program Recommendations:    Consider adding Novolog  2 units TID with meals if eating > 50%  Hesitant to go up on Lantus  since pt was hypo yesterday am at 38 mg/dL  Continue to follow.  Thank you. Shona Brandy, RD, LDN, CDCES Inpatient Diabetes Coordinator [phone]

## 2024-06-21 DIAGNOSIS — R6520 Severe sepsis without septic shock: Secondary | ICD-10-CM | POA: Diagnosis not present

## 2024-06-21 DIAGNOSIS — A419 Sepsis, unspecified organism: Secondary | ICD-10-CM | POA: Diagnosis not present

## 2024-06-21 LAB — CBC
HCT: 30 % — ABNORMAL LOW (ref 39.0–52.0)
Hemoglobin: 8.9 g/dL — ABNORMAL LOW (ref 13.0–17.0)
MCH: 24.7 pg — ABNORMAL LOW (ref 26.0–34.0)
MCHC: 29.7 g/dL — ABNORMAL LOW (ref 30.0–36.0)
MCV: 83.1 fL (ref 80.0–100.0)
Platelets: 351 K/uL (ref 150–400)
RBC: 3.61 MIL/uL — ABNORMAL LOW (ref 4.22–5.81)
RDW: 18.4 % — ABNORMAL HIGH (ref 11.5–15.5)
WBC: 11.5 K/uL — ABNORMAL HIGH (ref 4.0–10.5)
nRBC: 0 % (ref 0.0–0.2)

## 2024-06-21 LAB — GLUCOSE, CAPILLARY
Glucose-Capillary: 173 mg/dL — ABNORMAL HIGH (ref 70–99)
Glucose-Capillary: 156 mg/dL — ABNORMAL HIGH (ref 70–99)
Glucose-Capillary: 189 mg/dL — ABNORMAL HIGH (ref 70–99)
Glucose-Capillary: 174 mg/dL — ABNORMAL HIGH (ref 70–99)

## 2024-06-21 LAB — CULTURE, BLOOD (ROUTINE X 2)
Culture: NO GROWTH
Special Requests: ADEQUATE

## 2024-06-21 LAB — BASIC METABOLIC PANEL WITH GFR
Anion gap: 9 (ref 5–15)
BUN: 11 mg/dL (ref 8–23)
CO2: 19 mmol/L — ABNORMAL LOW (ref 22–32)
Calcium: 8.3 mg/dL — ABNORMAL LOW (ref 8.9–10.3)
Chloride: 113 mmol/L — ABNORMAL HIGH (ref 98–111)
Creatinine, Ser: 0.5 mg/dL — ABNORMAL LOW (ref 0.61–1.24)
GFR, Estimated: >60 mL/min (ref >60)
Glucose, Bld: 196 mg/dL — ABNORMAL HIGH (ref 70–99)
Potassium: 3.6 mmol/L (ref 3.5–5.1)
Sodium: 141 mmol/L (ref 135–145)

## 2024-06-21 MED ORDER — SODIUM CHLORIDE 0.9 % IV SOLN
2.0000 g | Freq: Three times a day (TID) | INTRAVENOUS | Status: DC
  Administered 2024-06-21 – 2024-06-22 (×3): 2 g via INTRAVENOUS
  Filled 2024-06-21 (×3): qty 12.5

## 2024-06-21 MED ORDER — SODIUM CHLORIDE 0.9 % IV SOLN
3.0000 g | Freq: Four times a day (QID) | INTRAVENOUS | Status: DC
  Administered 2024-06-21: 3 g via INTRAVENOUS
  Filled 2024-06-21 (×2): qty 8

## 2024-06-21 NOTE — Progress Notes (Signed)
 PROGRESS NOTE    Brent [name].  FMW:[id number] DOB: 18-Dec-1945 DOA: 06/15/2024 PCP: Feliciano Devoria LABOR, MD   Brief Narrative:  This 78 years old Male with PMH significant for CAD status post DES, diabetes type 2, hypertension, hyperlipidemia, history of melanoma, OSA, transverse myelitis, stage IV sacral decubitus ulcer, history of chronic OM, colostomy and suprapubic catheter presents to the ED with concerns of worsening decubitus ulcer and complaints of fatigue.  He has had stage IV ulcers for 4-years, wound appears to be getting worse,  He presented to the ED.  He was also noted to be confused at rehab.  Patient has had poor oral intake and nausea over the last few weeks.  CT abdomen and pelvis showed large decubitus ulcer overlying the sacrum and bilateral ischial tuberosity.  Finding compatible with acute osteomyelitis within the left ischium and chronic osteomyelitis within the right ischium.  New 4 x 6 cm complex fluid collection within the subcutaneous tissue posterior to the left thigh, immediately inferior to the left ischial decubitus ulcer consistent with abscess.   Surgery consulted and patient underwent wound exploration with incision and drainage and open packing of the left posterior thigh abscess on 9/30 by Dr. Krystal Led.    Assessment & Plan:   Principal Problem:   Severe sepsis secondary to acute OM of left ischium and posterior thigh abscess Active Problems:   AKI (acute kidney injury)   DM type 2 (diabetes mellitus, type 2) (HCC)   (HFpEF) heart failure with preserved ejection fraction (HCC)   Essential hypertension   Coronary artery disease involving native coronary artery of native heart without angina pectoris   Normocytic anemia   Neurogenic bladder   Hyperlipidemia   GERD (gastroesophageal reflux disease)   Colostomy present (HCC)   Chronic osteomyelitis of pelvic region, right (HCC)   Obstructive sleep apnea   Thigh abscess   Acute osteomyelitis  involving pelvic region and thigh, left (HCC)   Wound infection   Severe sepsis secondary to Acute osteomyelitis of the left ischium and posterior thigh abscess: Staph warneri + Blood culture, thought to be contaminate.  Stage IV right and left lower mid upper decubitus ulcer and left thigh pressure posterior abscess: Patient presented with altered mental status, leukocytosis, hypotension, AKI, tachypnea,  source of infection decubitus ulcer - Continue with IV antibiotics, patient refusing oral medication at times. - Underwent wound exploration with incision and drainage and open packing active left posterior thigh abscess by surgery on 9/30. - Plan : Transition  to oral Doxycycline  and Augmentin  for 7 days when tolerating orals. - Follow wound culture results: rare proteus mirabilis.    Acute kidney injury: > Resolved. - Creatinine baseline 0.8.  Presented with a creatinine of 1.4 - Improved with fluids. Received IV fluids.    Hypokalemia:  Replaced.  Resolved   UTI: Urine culture growing 100,000 colonies of Proteus mirabilis. Continue IV Unasyn  .   Diabetes type 2: Hyperglycemia: Continue to hold metformin  Hb A1c 7.1 Continue sliding scale and Semglee .   Chronic diastolic heart failure with preserved ejection fraction - Holding Lasix  for poor oral intake.   Essential hypertension: - Hold Lasix  and antihypertensives. - Resume when blood pressure is high.   Coronary artery disease involving native coronary artery of native heart without angina pectoris: - Stable.  Continue Lipitor.   Acute on chronic normocytic anemia Baseline hemoglobin 9-10 Component  of iron  deficiency anemia. Received 1 unit of packed red blood cell. Monitor H&H.   Neurogenic bladder:  Suprapubic catheter, continue foley care    Hyperlipidemia: Continue lipitor 40mg  daily    GERD (gastroesophageal reflux disease): Continue pepcid  BID .   Colostomy present [facility]) Colostomy care .   Chronic  osteomyelitis of pelvic region, right [facility]) Seen by ID in April of 2025    Obstructive sleep apnea: States he no longer uses cpap at night.     FTT; poor oral intake . In setting acute illness and delirium.  Continue Glucerna.  Star Megace.    Delirium; acute encephalopathy He has been more sleepy.  Suspect post surgery, anesthesia, infection process.  Will change baclofen  to PRN.  Mental status back to baseline.   See wound care documentation below Wound 06/15/24 2300 Pressure Injury Buttocks Right;Left;Lower;Mid;Upper Stage 4 - Full thickness tissue loss with exposed bone, tendon or muscle. (Active)     Wound 06/15/24 2300 Pressure Injury Thigh Left;Posterior;Proximal (Active)        Estimated body mass index is 20.34 kg/m as calculated from the following:   Height as of this encounter: 6' (1.829 m).   Weight as of this encounter: 68 kg.   DVT prophylaxis: Lovenox  Code Status: Full code Family Communication: No family at bed side Disposition Plan:    Status is: Inpatient Remains inpatient appropriate because: Severity of illness.    Consultants:  General Surgery  Procedures:S/P I& D  Antimicrobials:  Anti-infectives (From admission, onward)    Start     Dose/Rate Route Frequency Ordered Stop   06/21/24 1000  Ampicillin -Sulbactam (UNASYN ) 3 g in sodium chloride  0.9 % 100 mL IVPB        3 g 200 mL/hr over 30 Minutes Intravenous Every 6 hours 06/21/24 0822     06/20/24 1000  doxycycline  (VIBRAMYCIN ) 100 mg in sodium chloride  0.9 % 250 mL IVPB        100 mg 125 mL/hr over 120 Minutes Intravenous Every 12 hours 06/20/24 0840     06/20/24 0900  Ampicillin -Sulbactam (UNASYN ) 3 g in sodium chloride  0.9 % 100 mL IVPB  Status:  Discontinued        3 g 200 mL/hr over 30 Minutes Intravenous Every 6 hours 06/20/24 0840 06/21/24 0822   06/16/24 2000  ceFEPIme  (MAXIPIME ) 2 g in sodium chloride  0.9 % 100 mL IVPB  Status:  Discontinued        2 g 200 mL/hr over 30 Minutes  Intravenous Every 12 hours 06/16/24 1408 06/20/24 0840   06/16/24 1500  vancomycin  (VANCOCIN ) IVPB 1000 mg/200 mL premix  Status:  Discontinued        1,000 mg 200 mL/hr over 60 Minutes Intravenous Every 24 hours 06/15/24 1731 06/16/24 1353   06/16/24 1500  vancomycin  (VANCOREADY) IVPB 1250 mg/250 mL  Status:  Discontinued        1,250 mg 166.7 mL/hr over 90 Minutes Intravenous Every 24 hours 06/16/24 1353 06/20/24 0840   06/16/24 1400  ceFEPIme  (MAXIPIME ) 2 g in sodium chloride  0.9 % 100 mL IVPB  Status:  Discontinued        2 g 200 mL/hr over 30 Minutes Intravenous Every 8 hours 06/16/24 1353 06/16/24 1408   06/16/24 0600  ceFEPIme  (MAXIPIME ) 2 g in sodium chloride  0.9 % 100 mL IVPB  Status:  Discontinued        2 g 200 mL/hr over 30 Minutes Intravenous Every 12 hours 06/15/24 1730 06/16/24 1353   06/15/24 1330  vancomycin  (VANCOCIN ) IVPB 1000 mg/200 mL premix        1,000  mg 200 mL/hr over 60 Minutes Intravenous  Once 06/15/24 1329 06/15/24 1601   06/15/24 1330  ceFEPIme  (MAXIPIME ) 2 g in sodium chloride  0.9 % 100 mL IVPB        2 g 200 mL/hr over 30 Minutes Intravenous  Once 06/15/24 1329 06/15/24 1825   06/15/24 1300  cefTRIAXone  (ROCEPHIN ) 1 g in sodium chloride  0.9 % 100 mL IVPB  Status:  Discontinued        1 g 200 mL/hr over 30 Minutes Intravenous  Once 06/15/24 1259 06/15/24 1340      Subjective: Patient was seen and examined at bedside.  Overnight events noted. Patient was lying comfortably on the bed, reports he feels better and he is ready to be discharged back to Facility.  Objective: Vitals:   06/20/24 1330 06/20/24 1659 06/20/24 2123 06/21/24 0616  BP: (!) 156/62 (!) 144/58 (!) 149/48 (!) 132/58  Pulse: 77 64 65 (!) 57  Resp: 16   18  Temp: 97.6 F (36.4 C)  98.2 F (36.8 C) 98 F (36.7 C)  TempSrc: Oral  Oral   SpO2: 100%  100% 99%  Weight:      Height:        Intake/Output Summary (Last 24 hours) at 06/21/2024 1100 Last data filed at 06/21/2024  0500 Gross per 24 hour  Intake 2109.11 ml  Output 1335 ml  Net 774.11 ml   Filed Weights   06/15/24 1221 06/18/24 0829  Weight: 68 kg 68 kg    Examination:  General exam: Appears calm and comfortable, not in any acute distress, deconditioned. Respiratory system: Clear to auscultation. Respiratory effort normal.  RR 12 Cardiovascular system: S1 & S2 heard, RRR. No JVD, murmurs, rubs, gallops or clicks.  Gastrointestinal system: Abdomen is non distended, soft and non tender.  Normal bowel sounds heard. Central nervous system: Alert and oriented x 3. No focal neurological deficits. Extremities: No edema, no cyanosis, no clubbing. Skin: No rashes, lesions or ulcers Psychiatry: Judgement and insight appear normal. Mood & affect appropriate.     Data Reviewed: I have personally reviewed following labs and imaging studies  CBC: Recent Labs  Lab 06/15/24 1224 06/16/24 0438 06/18/24 0555 06/18/24 1342 06/19/24 0458 06/20/24 0601 06/21/24 0447  WBC 23.9*   < > 11.4* 12.2* 11.9* 9.5 11.5*  NEUTROABS 20.1*  --   --   --   --   --   --   HGB 8.6*   < > 7.8* 7.2* 9.1* 8.7* 8.9*  HCT 28.8*   < > 26.8* 25.9* 30.4* 29.8* 30.0*  MCV 78.5*   < > 80.0 81.2 82.4 83.7 83.1  PLT 450*   < > 359 369 394 376 351   < > = values in this interval not displayed.   Basic Metabolic Panel: Recent Labs  Lab 06/16/24 0438 06/18/24 0555 06/19/24 0458 06/20/24 0601 06/21/24 0447  NA 135 136 138 137 141  K 3.7 3.3* 3.0* 2.9* 3.6  CL 105 107 109 111 113*  CO2 19* 18* 18* 17* 19*  GLUCOSE 105* 271* 189* 257* 196*  BUN 23 27* 19 14 11   CREATININE 0.97 0.63 0.56* 0.49* 0.50*  CALCIUM  8.4* 8.7* 8.5* 8.4* 8.3*  PHOS  --  3.4 2.9 2.7  --    GFR: Estimated Creatinine Clearance: 72 mL/min (A) (by C-G formula based on SCr of 0.5 mg/dL (L)). Liver Function Tests: Recent Labs  Lab 06/15/24 1224 06/16/24 0438 06/18/24 0555 06/19/24 0458 06/20/24 0601  AST  40 35  --   --   --   ALT 48* 37  --    --   --   ALKPHOS 157* 159*  --   --   --   BILITOT 0.5 0.2  --   --   --   PROT 6.7 5.9*  --   --   --   ALBUMIN  2.7* 2.3* 2.4* 2.2* 2.1*   No results for input(s): LIPASE, AMYLASE in the last 168 hours. No results for input(s): AMMONIA in the last 168 hours. Coagulation Profile: Recent Labs  Lab 06/15/24 1224  INR 1.3*   Cardiac Enzymes: No results for input(s): CKTOTAL, CKMB, CKMBINDEX, TROPONINI in the last 168 hours. BNP (last 3 results) No results for input(s): PROBNP in the last 8760 hours. HbA1C: No results for input(s): HGBA1C in the last 72 hours. CBG: Recent Labs  Lab 06/20/24 1141 06/20/24 1532 06/20/24 1705 06/20/24 2120 06/21/24 0733  GLUCAP 293* 320* 314* 254* 173*   Lipid Profile: No results for input(s): CHOL, HDL, LDLCALC, TRIG, CHOLHDL, LDLDIRECT in the last 72 hours. Thyroid  Function Tests: No results for input(s): TSH, T4TOTAL, FREET4, T3FREE, THYROIDAB in the last 72 hours. Anemia Panel: No results for input(s): VITAMINB12, FOLATE, FERRITIN, TIBC, IRON , RETICCTPCT in the last 72 hours. Sepsis Labs: Recent Labs  Lab 06/15/24 1224 06/15/24 1248  PROCALCITON 2.23  --   LATICACIDVEN  --  1.8    Recent Results (from the past 240 hours)  Resp panel by RT-PCR (RSV, Flu A&B, Covid) Anterior Nasal Swab     Status: None   Collection Time: 06/15/24 12:24 PM   Specimen: Anterior Nasal Swab  Result Value Ref Range Status   SARS Coronavirus 2 by RT PCR NEGATIVE NEGATIVE Final    Comment: (NOTE) SARS-CoV-2 target nucleic acids are NOT DETECTED.  The SARS-CoV-2 RNA is generally detectable in upper respiratory specimens during the acute phase of infection. The lowest concentration of SARS-CoV-2 viral copies this assay can detect is 138 copies/mL. A negative result does not preclude SARS-Cov-2 infection and should not be used as the sole basis for treatment or other patient management decisions. A  negative result may occur with  improper specimen collection/handling, submission of specimen other than nasopharyngeal swab, presence of viral mutation(s) within the areas targeted by this assay, and inadequate number of viral copies(<138 copies/mL). A negative result must be combined with clinical observations, patient history, and epidemiological information. The expected result is Negative.  Fact Sheet for Patients:  BloggerCourse.com  Fact Sheet for Healthcare Providers:  SeriousBroker.it  This test is no t yet approved or cleared by the [place]  FDA and  has been authorized for detection and/or diagnosis of SARS-CoV-2 by FDA under an Emergency Use Authorization (EUA). This EUA will remain  in effect (meaning this test can be used) for the duration of the COVID-19 declaration under Section 564(b)(1) of the Act, 21 U.S.C.section 360bbb-3(b)(1), unless the authorization is terminated  or revoked sooner.       Influenza A by PCR NEGATIVE NEGATIVE Final   Influenza B by PCR NEGATIVE NEGATIVE Final    Comment: (NOTE) The Xpert Xpress SARS-CoV-2/FLU/RSV plus assay is intended as an aid in the diagnosis of influenza from Nasopharyngeal swab specimens and should not be used as a sole basis for treatment. Nasal washings and aspirates are unacceptable for Xpert Xpress SARS-CoV-2/FLU/RSV testing.  Fact Sheet for Patients: BloggerCourse.com  Fact Sheet for Healthcare Providers: SeriousBroker.it  This test is not yet approved  or cleared by the [place]  FDA and has been authorized for detection and/or diagnosis of SARS-CoV-2 by FDA under an Emergency Use Authorization (EUA). This EUA will remain in effect (meaning this test can be used) for the duration of the COVID-19 declaration under Section 564(b)(1) of the Act, 21 U.S.C. section 360bbb-3(b)(1), unless the authorization  is terminated or revoked.     Resp Syncytial Virus by PCR NEGATIVE NEGATIVE Final    Comment: (NOTE) Fact Sheet for Patients: BloggerCourse.com  Fact Sheet for Healthcare Providers: SeriousBroker.it  This test is not yet approved or cleared by the [place]  FDA and has been authorized for detection and/or diagnosis of SARS-CoV-2 by FDA under an Emergency Use Authorization (EUA). This EUA will remain in effect (meaning this test can be used) for the duration of the COVID-19 declaration under Section 564(b)(1) of the Act, 21 U.S.C. section 360bbb-3(b)(1), unless the authorization is terminated or revoked.  Performed at [facility], [address] [place], [place] [address]   Urine Culture     Status: Abnormal   Collection Time: 06/15/24 12:25 PM   Specimen: Urine, Random  Result Value Ref Range Status   Specimen Description   Final    URINE, RANDOM Performed at [facility], [address], [place], [place] [address]    Special Requests   Final    NONE Reflexed from [id number] Performed at [facility], [address] [place], [place] [address]    Culture >=100,000 COLONIES/mL PROTEUS MIRABILIS (A)  Final   Report Status 06/18/2024 FINAL  Final   Organism ID, Bacteria PROTEUS MIRABILIS (A)  Final      Susceptibility   Proteus mirabilis - MIC*    AMPICILLIN  <=2 SENSITIVE Sensitive     CEFAZOLIN  (URINE) Value in next row Sensitive      4 SENSITIVEThis is a modified FDA-approved test that has been validated and its performance characteristics determined by the reporting laboratory.  This laboratory is certified under the Clinical Laboratory Improvement Amendments CLIA as qualified to perform high complexity clinical laboratory testing.    CEFEPIME  Value in next row Sensitive      4 SENSITIVEThis is a modified FDA-approved test that has been validated and its performance  characteristics determined by the reporting laboratory.  This laboratory is certified under the Clinical Laboratory Improvement Amendments CLIA as qualified to perform high complexity clinical laboratory testing.    ERTAPENEM Value in next row Sensitive      4 SENSITIVEThis is a modified FDA-approved test that has been validated and its performance characteristics determined by the reporting laboratory.  This laboratory is certified under the Clinical Laboratory Improvement Amendments CLIA as qualified to perform high complexity clinical laboratory testing.    CEFTRIAXONE  Value in next row Sensitive      4 SENSITIVEThis is a modified FDA-approved test that has been validated and its performance characteristics determined by the reporting laboratory.  This laboratory is certified under the Clinical Laboratory Improvement Amendments CLIA as qualified to perform high complexity clinical laboratory testing.    CIPROFLOXACIN  Value in next row Sensitive      4 SENSITIVEThis is a modified FDA-approved test that has been validated and its performance characteristics determined by the reporting laboratory.  This laboratory is certified under the Clinical Laboratory Improvement Amendments CLIA as qualified to perform high complexity clinical laboratory testing.    GENTAMICIN  Value in next row Sensitive      4 SENSITIVEThis is a modified FDA-approved  test that has been validated and its performance characteristics determined by the reporting laboratory.  This laboratory is certified under the Clinical Laboratory Improvement Amendments CLIA as qualified to perform high complexity clinical laboratory testing.    NITROFURANTOIN Value in next row Resistant      4 SENSITIVEThis is a modified FDA-approved test that has been validated and its performance characteristics determined by the reporting laboratory.  This laboratory is certified under the Clinical Laboratory Improvement Amendments CLIA as qualified to perform high  complexity clinical laboratory testing.    TRIMETH /SULFA  Value in next row Resistant      4 SENSITIVEThis is a modified FDA-approved test that has been validated and its performance characteristics determined by the reporting laboratory.  This laboratory is certified under the Clinical Laboratory Improvement Amendments CLIA as qualified to perform high complexity clinical laboratory testing.    AMPICILLIN /SULBACTAM Value in next row Sensitive      4 SENSITIVEThis is a modified FDA-approved test that has been validated and its performance characteristics determined by the reporting laboratory.  This laboratory is certified under the Clinical Laboratory Improvement Amendments CLIA as qualified to perform high complexity clinical laboratory testing.    PIP/TAZO Value in next row Sensitive      <=4 SENSITIVEThis is a modified FDA-approved test that has been validated and its performance characteristics determined by the reporting laboratory.  This laboratory is certified under the Clinical Laboratory Improvement Amendments CLIA as qualified to perform high complexity clinical laboratory testing.    MEROPENEM  Value in next row Sensitive      <=4 SENSITIVEThis is a modified FDA-approved test that has been validated and its performance characteristics determined by the reporting laboratory.  This laboratory is certified under the Clinical Laboratory Improvement Amendments CLIA as qualified to perform high complexity clinical laboratory testing.    * >=100,000 COLONIES/mL PROTEUS MIRABILIS  Blood Culture (routine x 2)     Status: None   Collection Time: 06/15/24 12:26 PM   Specimen: BLOOD LEFT FOREARM  Result Value Ref Range Status   Specimen Description   Final    BLOOD LEFT FOREARM Performed at [facility], [address] [place], [place] [address]    Special Requests   Final    BOTTLES DRAWN AEROBIC AND ANAEROBIC Blood Culture adequate volume Performed at [facility], [address] [place], [place] [address]    Culture   Final    NO GROWTH 5 DAYS Performed at [facility], [address], [place], [place] [address]    Report Status 06/21/2024 FINAL  Final  Blood Culture (routine x 2)     Status: Abnormal   Collection Time: 06/15/24 12:27 PM   Specimen: BLOOD RIGHT FOREARM  Result Value Ref Range Status   Specimen Description   Final    BLOOD RIGHT FOREARM Performed at [facility], [address] [place], [place] [address]    Special Requests   Final    BOTTLES DRAWN AEROBIC AND ANAEROBIC Blood Culture adequate volume Performed at [facility], [address], [place], [place] [address]    Culture  Setup Time   Final    GRAM POSITIVE COCCI ANAEROBIC BOTTLE ONLY CRITICAL RESULT CALLED TO, READ BACK BY AND VERIFIED WITH: LEANNE POINDEXTER ON 06/16/24 AT 0629 QSD    Culture (A)  Final    STAPHYLOCOCCUS WARNERI THE SIGNIFICANCE OF ISOLATING THIS ORGANISM FROM A SINGLE SET OF BLOOD CULTURES WHEN MULTIPLE SETS  ARE DRAWN IS UNCERTAIN. PLEASE NOTIFY THE MICROBIOLOGY DEPARTMENT WITHIN ONE WEEK IF SPECIATION AND SENSITIVITIES ARE REQUIRED. Performed at [facility], [address] [place], [place] [address]    Report Status 06/18/2024 FINAL  Final  Blood Culture ID Panel (Reflexed)     Status: Abnormal   Collection Time: 06/15/24 12:27 PM  Result Value Ref Range Status   Enterococcus faecalis NOT DETECTED NOT DETECTED Final   Enterococcus Faecium NOT DETECTED NOT DETECTED Final   Listeria monocytogenes NOT DETECTED NOT DETECTED Final   Staphylococcus species DETECTED (A) NOT DETECTED Final    Comment: CRITICAL RESULT CALLED TO, READ BACK BY AND VERIFIED WITH: LEANNE POINDEXTER ON 06/16/24 AT 9370 QSD    Staphylococcus aureus (BCID) NOT DETECTED NOT DETECTED Final   Staphylococcus epidermidis NOT DETECTED NOT DETECTED Final   Staphylococcus lugdunensis NOT DETECTED NOT DETECTED Final   Streptococcus species NOT  DETECTED NOT DETECTED Final   Streptococcus agalactiae NOT DETECTED NOT DETECTED Final   Streptococcus pneumoniae NOT DETECTED NOT DETECTED Final   Streptococcus pyogenes NOT DETECTED NOT DETECTED Final   A.calcoaceticus-baumannii NOT DETECTED NOT DETECTED Final   Bacteroides fragilis NOT DETECTED NOT DETECTED Final   Enterobacterales NOT DETECTED NOT DETECTED Final   Enterobacter cloacae complex NOT DETECTED NOT DETECTED Final   Escherichia coli NOT DETECTED NOT DETECTED Final   Klebsiella aerogenes NOT DETECTED NOT DETECTED Final   Klebsiella oxytoca NOT DETECTED NOT DETECTED Final   Klebsiella pneumoniae NOT DETECTED NOT DETECTED Final   Proteus species NOT DETECTED NOT DETECTED Final   Salmonella species NOT DETECTED NOT DETECTED Final   Serratia marcescens NOT DETECTED NOT DETECTED Final   Haemophilus influenzae NOT DETECTED NOT DETECTED Final   Neisseria meningitidis NOT DETECTED NOT DETECTED Final   Pseudomonas aeruginosa NOT DETECTED NOT DETECTED Final   Stenotrophomonas maltophilia NOT DETECTED NOT DETECTED Final   Candida albicans NOT DETECTED NOT DETECTED Final   Candida auris NOT DETECTED NOT DETECTED Final   Candida glabrata NOT DETECTED NOT DETECTED Final   Candida krusei NOT DETECTED NOT DETECTED Final   Candida parapsilosis NOT DETECTED NOT DETECTED Final   Candida tropicalis NOT DETECTED NOT DETECTED Final   Cryptococcus neoformans/gattii NOT DETECTED NOT DETECTED Final    Comment: Performed at [facility], [address] [place], [place] [address]  MRSA Next Gen by PCR, Nasal     Status: Abnormal   Collection Time: 06/16/24  4:42 AM   Specimen: Nasal Mucosa; Nasal Swab  Result Value Ref Range Status   MRSA by PCR Next Gen DETECTED (A) NOT DETECTED Final    Comment: Results Called to: [employer], J RN AT 1107 ON 06/16/2024 BY PRUDY, K (NOTE) The GeneXpert MRSA Assay (FDA approved for NASAL specimens only), is one component of a comprehensive MRSA  colonization surveillance program. It is not intended to diagnose MRSA infection nor to guide or monitor treatment for MRSA infections. Test performance is not FDA approved in patients less than 59 years old. Performed at [facility], [address] [place], [place] [address]   Aerobic/Anaerobic Culture w Gram Stain (surgical/deep wound)     Status: None (Preliminary result)   Collection Time: 06/18/24 10:40 AM   Specimen: Wound; Abscess  Result Value Ref Range Status   Specimen Description   Final    WOUND Performed at [facility], [address] [place], [place] [address]    Special Requests   Final    LEFT  POSTERIOR THIGH MASS Performed at [facility], [address], [place], [place] [address]    Gram Stain   Final    MODERATE WBC PRESENT, PREDOMINANTLY PMN RARE GRAM POSITIVE COCCI IN CLUSTERS RARE GRAM NEGATIVE RODS    Culture   Final    RARE PROTEUS MIRABILIS RARE ESCHERICHIA COLI HOLDING FOR POSSIBLE ANAEROBE Performed at [facility], [address] [place], [place] [address]    Report Status PENDING  Incomplete   Organism ID, Bacteria PROTEUS MIRABILIS  Final   Organism ID, Bacteria ESCHERICHIA COLI  Final      Susceptibility   Escherichia coli - MIC*    AMPICILLIN  >=32 RESISTANT Resistant     CEFAZOLIN  (NON-URINE) 4 INTERMEDIATE Intermediate     CEFEPIME  <=0.12 SENSITIVE Sensitive     ERTAPENEM <=0.12 SENSITIVE Sensitive     CEFTRIAXONE  <=0.25 SENSITIVE Sensitive     CIPROFLOXACIN  >=4 RESISTANT Resistant     GENTAMICIN  <=1 SENSITIVE Sensitive     MEROPENEM  <=0.25 SENSITIVE Sensitive     TRIMETH /SULFA  <=20 SENSITIVE Sensitive     AMPICILLIN /SULBACTAM 16 INTERMEDIATE Intermediate     PIP/TAZO Value in next row Sensitive      <=4 SENSITIVEThis is a modified FDA-approved test that has been validated and its performance characteristics determined by the reporting laboratory.  This laboratory is  certified under the Clinical Laboratory Improvement Amendments CLIA as qualified to perform high complexity clinical laboratory testing.    * RARE ESCHERICHIA COLI   Proteus mirabilis - MIC*    AMPICILLIN  Value in next row Sensitive      <=4 SENSITIVEThis is a modified FDA-approved test that has been validated and its performance characteristics determined by the reporting laboratory.  This laboratory is certified under the Clinical Laboratory Improvement Amendments CLIA as qualified to perform high complexity clinical laboratory testing.    CEFAZOLIN  (NON-URINE) Value in next row Intermediate      <=4 SENSITIVEThis is a modified FDA-approved test that has been validated and its performance characteristics determined by the reporting laboratory.  This laboratory is certified under the Clinical Laboratory Improvement Amendments CLIA as qualified to perform high complexity clinical laboratory testing.    CEFEPIME  Value in next row Sensitive      <=4 SENSITIVEThis is a modified FDA-approved test that has been validated and its performance characteristics determined by the reporting laboratory.  This laboratory is certified under the Clinical Laboratory Improvement Amendments CLIA as qualified to perform high complexity clinical laboratory testing.    ERTAPENEM Value in next row Sensitive      <=4 SENSITIVEThis is a modified FDA-approved test that has been validated and its performance characteristics determined by the reporting laboratory.  This laboratory is certified under the Clinical Laboratory Improvement Amendments CLIA as qualified to perform high complexity clinical laboratory testing.    CEFTRIAXONE  Value in next row Sensitive      <=4 SENSITIVEThis is a modified FDA-approved test that has been validated and its performance characteristics determined by the reporting laboratory.  This laboratory is certified under the Clinical Laboratory Improvement Amendments CLIA as qualified to perform high  complexity clinical laboratory testing.    CIPROFLOXACIN  Value in next row Sensitive      <=4 SENSITIVEThis is a modified FDA-approved test that has been validated and its performance characteristics determined by the reporting laboratory.  This laboratory is certified under the Clinical Laboratory Improvement Amendments CLIA as qualified to perform high complexity clinical laboratory testing.    GENTAMICIN  Value  in next row Sensitive      <=4 SENSITIVEThis is a modified FDA-approved test that has been validated and its performance characteristics determined by the reporting laboratory.  This laboratory is certified under the Clinical Laboratory Improvement Amendments CLIA as qualified to perform high complexity clinical laboratory testing.    MEROPENEM  Value in next row Sensitive      <=4 SENSITIVEThis is a modified FDA-approved test that has been validated and its performance characteristics determined by the reporting laboratory.  This laboratory is certified under the Clinical Laboratory Improvement Amendments CLIA as qualified to perform high complexity clinical laboratory testing.    TRIMETH /SULFA  Value in next row Resistant      <=4 SENSITIVEThis is a modified FDA-approved test that has been validated and its performance characteristics determined by the reporting laboratory.  This laboratory is certified under the Clinical Laboratory Improvement Amendments CLIA as qualified to perform high complexity clinical laboratory testing.    AMPICILLIN /SULBACTAM Value in next row Sensitive      <=4 SENSITIVEThis is a modified FDA-approved test that has been validated and its performance characteristics determined by the reporting laboratory.  This laboratory is certified under the Clinical Laboratory Improvement Amendments CLIA as qualified to perform high complexity clinical laboratory testing.    PIP/TAZO Value in next row Sensitive      <=4 SENSITIVEThis is a modified FDA-approved test that has been  validated and its performance characteristics determined by the reporting laboratory.  This laboratory is certified under the Clinical Laboratory Improvement Amendments CLIA as qualified to perform high complexity clinical laboratory testing.    * RARE PROTEUS MIRABILIS    Radiology Studies: No results found.  Scheduled Meds:  artificial tears  1 drop Both Eyes Daily   ascorbic acid   500 mg Oral Daily   atorvastatin   40 mg Oral QHS   azelastine  2 spray Each Nare Daily   cholecalciferol   2,000 Units Oral Daily   DULoxetine   30 mg Oral QHS   enoxaparin  (LOVENOX ) injection  40 mg Subcutaneous QHS   famotidine   20 mg Oral BID   feeding supplement (GLUCERNA SHAKE)  237 mL Oral TID BM   ferrous sulfate   325 mg Oral Q breakfast   fluticasone   1 spray Each Nare QHS   insulin  aspart  0-15 Units Subcutaneous TID WC   insulin  aspart  0-5 Units Subcutaneous QHS   insulin  glargine  7 Units Subcutaneous QHS   lamoTRIgine   50 mg Oral BID   loratadine   10 mg Oral Daily   megestrol  40 mg Oral Daily   midodrine   10 mg Oral TID WC   nystatin   5 mL Oral QID   olopatadine   1 drop Both Eyes BID   senna-docusate  1 tablet Oral BID   thiamine  100 mg Oral Daily   traZODone   100 mg Oral QHS   zinc  sulfate (50mg  elemental zinc )  220 mg Oral Daily   Continuous Infusions:  ampicillin -sulbactam (UNASYN ) IV     doxycycline  (VIBRAMYCIN ) IV 100 mg (06/20/24 2337)     LOS: 6 days    Time spent: 50 mins    Darcel Dawley, MD [facility]   If 7PM-7AM, please contact night-coverage

## 2024-06-21 NOTE — Progress Notes (Signed)
 PT Cancellation Note  Patient Details Name: Brent [name]. MRN: [id number] DOB: 07/22/1946   Cancelled Treatment:    Reason Eval/Treat Not Completed: PT screened, no needs identified, will sign off (per OT pt is transferred via [facility] at baseline. PT signing off.)   Sylvan Delon Copp PT 06/21/2024  Acute Rehabilitation Services  Office [phone]

## 2024-06-21 NOTE — Evaluation (Signed)
 Occupational Therapy Evaluation and Discharge Patient Details Name: Brent [name]. MRN: [id number] DOB: 11/12/1945 Today's Date: 06/21/2024   History of Present Illness   78 yo male presents via EMS from [facility] with concern for fatigue and decreased appetite for the past few days, chronic wounds to legs and sacral ulcer (worsening, now with purulent drainage). Pt underwent an I & D of left posterior thigh abcess on 9/30. CT: known osteo, decub and posterior left thigh abscess. Found to have severe sepsis secondary to acute OM of left ischium and posterior thigh abscess. 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 His left heel at bony area is getting red--Dr. Leotis has given me the okay to order Prevalon boots for him. No further OT needs, we will sign off. No PT needs identified and I have made them aware on their PT log.     If plan is discharge home, recommend the following:   Two people to help with walking and/or transfers;A lot of help with bathing/dressing/bathroom;Assistance with feeding;Assist for transportation;Help with stairs or ramp for entrance;Direct supervision/assist for financial management;Direct supervision/assist for medications management     Functional Status Assessment   Patient has not had a recent decline in their functional status     Equipment Recommendations   None recommended by OT      Precautions/Restrictions   Precautions Precaution/Restrictions Comments: sacral and left posterior thigh abcess as well as Bil shin wounds Restrictions Weight Bearing Restrictions Per Provider Order:  No     Mobility Bed Mobility Overal bed mobility: Needs Assistance             General bed mobility comments: total A    Transfers Overall transfer level: Needs assistance                 General transfer comment: hoyer lift per pt at facility      Balance Overall balance assessment:  (unable)                                         ADL either performed or assessed with clinical judgement   ADL Overall ADL's : Needs assistance/impaired                                       General ADL Comments: total A. Talked with pt about a universal cuff for self feeding (he thinks he has tried one before). I feel he could self feed alot of his food if he used on of theses, but when asked pt if he was interested in trying one he reported it is too hard to feed himself.  He does have a point in as if he was not positioned properly and food was not positioned properly he would not be able to do this even with a universal cuff--and at SNF this cannot be guaranteed. Pt not interested in trying at this time.     Vision Baseline Vision/History: 1 Wears glasses Ability to  See in Adequate Light: 0 Adequate Patient Visual Report: No change from baseline              Pertinent Vitals/Pain Pain Assessment Pain Assessment: No/denies pain     Extremity/Trunk Assessment Upper Extremity Assessment Upper Extremity Assessment: Right hand dominant;RUE deficits/detail;LUE deficits/detail RUE Deficits / Details: Close to full AROM of everything except digits--these are in extension with very little AROM and minimal PROM RUE Coordination: decreased fine motor LUE Deficits / Details: Close to full AROM of everything except digits--these are in extension with very little AROM and minimal PROM LUE Coordination: decreased fine motor           Communication Communication Communication: No apparent difficulties   Cognition Arousal: Alert Behavior During  Therapy: WFL for tasks assessed/performed Cognition: Cognition impaired             OT - Cognition Comments: having a hard time telling me if he has heel off-loading boots at [facility] or not                 Following commands: Intact       Cueing   Cueing Techniques: Verbal cues              Home Living Family/patient expects to be discharged to:: Skilled nursing facility                                 Additional Comments: pt is resident at [facility]      Prior Functioning/Environment Prior Level of Function : Needs assist             Mobility Comments: requires hoyer lift, has power wheelchair ADLs Comments: total A at bed level    OT Problem List: Decreased range of motion        OT Goals(Current goals can be found in the care plan section)   Acute Rehab OT Goals Patient Stated Goal: for someone to continue to feed him instead of trying U-cuff         AM-PAC OT 6 Clicks Daily Activity     Outcome Measure Help from another person eating meals?: Total Help from another person taking care of personal grooming?: Total Help from another person toileting, which includes using toliet, bedpan, or urinal?: Total Help from another person bathing (including washing, rinsing, drying)?: Total Help from another person to put on and taking off regular upper body clothing?: Total Help from another person to put on and taking off regular lower body clothing?: Total 6 Click Score: 6   End of Session Nurse Communication:  (informed NT that pt was flat in bed with HOB ~10 degrees when I entered and does currently have a pillow under his left side (this was there when I entered))  Activity Tolerance: Patient tolerated treatment well Patient left: in bed (NT feeding patient)  OT Visit Diagnosis: Other abnormalities of gait and mobility (R26.89);Muscle weakness (generalized) (M62.81)                Time: 9197-9185 OT Time Calculation  (min): 12 min Charges:  OT General Charges $OT Visit: 1 Visit OT Evaluation $OT Eval Low Complexity: 1 Low  Donny BECKER OT Acute Rehabilitation Services Office [phone]    [place] Dorothyann Distel 06/21/2024, 8:28 AM

## 2024-06-22 DIAGNOSIS — R6520 Severe sepsis without septic shock: Secondary | ICD-10-CM | POA: Diagnosis not present

## 2024-06-22 DIAGNOSIS — A419 Sepsis, unspecified organism: Secondary | ICD-10-CM | POA: Diagnosis not present

## 2024-06-22 LAB — CBC
HCT: 30.2 % — ABNORMAL LOW (ref 39.0–52.0)
Hemoglobin: 8.9 g/dL — ABNORMAL LOW (ref 13.0–17.0)
MCH: 24 pg — ABNORMAL LOW (ref 26.0–34.0)
MCHC: 29.5 g/dL — ABNORMAL LOW (ref 30.0–36.0)
MCV: 81.4 fL (ref 80.0–100.0)
Platelets: 327 K/uL (ref 150–400)
RBC: 3.71 MIL/uL — ABNORMAL LOW (ref 4.22–5.81)
RDW: 18.9 % — ABNORMAL HIGH (ref 11.5–15.5)
WBC: 10 K/uL (ref 4.0–10.5)
nRBC: 0 % (ref 0.0–0.2)

## 2024-06-22 LAB — MAGNESIUM: Magnesium: 1.6 mg/dL — ABNORMAL LOW (ref 1.7–2.4)

## 2024-06-22 LAB — GLUCOSE, CAPILLARY
Glucose-Capillary: 171 mg/dL — ABNORMAL HIGH (ref 70–99)
Glucose-Capillary: 203 mg/dL — ABNORMAL HIGH (ref 70–99)
Glucose-Capillary: 146 mg/dL — ABNORMAL HIGH (ref 70–99)
Glucose-Capillary: 141 mg/dL — ABNORMAL HIGH (ref 70–99)

## 2024-06-22 LAB — BASIC METABOLIC PANEL WITH GFR
Anion gap: 7 (ref 5–15)
BUN: 9 mg/dL (ref 8–23)
CO2: 20 mmol/L — ABNORMAL LOW (ref 22–32)
Calcium: 8.3 mg/dL — ABNORMAL LOW (ref 8.9–10.3)
Chloride: 108 mmol/L (ref 98–111)
Creatinine, Ser: 0.44 mg/dL — ABNORMAL LOW (ref 0.61–1.24)
GFR, Estimated: >60 mL/min (ref >60)
Glucose, Bld: 173 mg/dL — ABNORMAL HIGH (ref 70–99)
Potassium: 3.4 mmol/L — ABNORMAL LOW (ref 3.5–5.1)
Sodium: 135 mmol/L (ref 135–145)

## 2024-06-22 LAB — PHOSPHORUS: Phosphorus: 1.9 mg/dL — ABNORMAL LOW (ref 2.5–4.6)

## 2024-06-22 MED ORDER — DOXYCYCLINE HYCLATE 100 MG PO TABS
100.0000 mg | ORAL_TABLET | Freq: Two times a day (BID) | ORAL | Status: DC
Start: 2024-06-22 — End: 2024-06-25
  Administered 2024-06-22 – 2024-06-24 (×5): 100 mg via ORAL
  Filled 2024-06-22 (×5): qty 1

## 2024-06-22 MED ORDER — AMOXICILLIN-POT CLAVULANATE 875-125 MG PO TABS
1.0000 | ORAL_TABLET | Freq: Two times a day (BID) | ORAL | Status: DC
Start: 2024-06-22 — End: 2024-06-25
  Administered 2024-06-22 – 2024-06-24 (×4): 1 via ORAL
  Filled 2024-06-22 (×4): qty 1

## 2024-06-22 NOTE — Progress Notes (Signed)
 PROGRESS NOTE    Brent [name].  FMW:[id number] DOB: Jan 27, 1946 DOA: 06/15/2024 PCP: Feliciano Devoria LABOR, MD   Brief Narrative:  This 78 years old Male with PMH significant for CAD status post DES, diabetes type 2, hypertension, hyperlipidemia, history of melanoma, OSA, transverse myelitis, stage IV sacral decubitus ulcer, history of chronic OM, colostomy and suprapubic catheter presents to the ED with concerns of worsening decubitus ulcer and complaints of fatigue.  He has had stage IV ulcers for 4-years, wound appears to be getting worse,  He presented to the ED.  He was also noted to be confused at rehab.  Patient has had poor oral intake and nausea over the last few weeks.  CT abdomen and pelvis showed large decubitus ulcer overlying the sacrum and bilateral ischial tuberosity.  Finding compatible with acute osteomyelitis within the left ischium and chronic osteomyelitis within the right ischium.  New 4 x 6 cm complex fluid collection within the subcutaneous tissue posterior to the left thigh, immediately inferior to the left ischial decubitus ulcer consistent with abscess.   Surgery consulted and patient underwent wound exploration with incision and drainage and open packing of the left posterior thigh abscess on 9/30 by Dr. Krystal Led.    Assessment & Plan:   Principal Problem:   Severe sepsis secondary to acute OM of left ischium and posterior thigh abscess Active Problems:   AKI (acute kidney injury)   DM type 2 (diabetes mellitus, type 2) (HCC)   (HFpEF) heart failure with preserved ejection fraction (HCC)   Essential hypertension   Coronary artery disease involving native coronary artery of native heart without angina pectoris   Normocytic anemia   Neurogenic bladder   Hyperlipidemia   GERD (gastroesophageal reflux disease)   Colostomy present (HCC)   Chronic osteomyelitis of pelvic region, right (HCC)   Obstructive sleep apnea   Thigh abscess   Acute osteomyelitis  involving pelvic region and thigh, left (HCC)   Wound infection   Severe sepsis secondary to Acute osteomyelitis of the left ischium and posterior thigh abscess: Staph warneri + Blood culture, thought to be contaminate.  Stage IV right and left lower mid upper decubitus ulcer and left thigh pressure posterior abscess: Patient presented with altered mental status, leukocytosis, hypotension, AKI, tachypnea,  source of infection decubitus ulcer - Continue with IV antibiotics, patient refusing oral medication at times. - Underwent wound exploration with incision and drainage and open packing active left posterior thigh abscess by surgery on 9/30. - Plan : Transition  to oral Doxycycline  and Augmentin  for 7 days when tolerating orals. - Follow wound culture results: rare proteus mirabilis.    Acute kidney injury: > Resolved. - Creatinine baseline 0.8.  Presented with a creatinine of 1.4 - Improved with fluids. Received IV fluids.    Hypokalemia:  Replaced.  Continue to monitor.   UTI: Urine culture growing 100,000 colonies of Proteus mirabilis. Continue IV cefepime .   Diabetes type 2: Hyperglycemia: Continue to hold metformin . Hb A1c 7.1 Continue sliding scale and Semglee .   Chronic diastolic heart failure with preserved ejection fraction: - Holding Lasix  for poor oral intake.   Essential hypertension: - Hold Lasix  and antihypertensives. - Resume when blood pressure is high.   Coronary artery disease involving native coronary artery of native heart without angina pectoris: - Stable.  Continue Lipitor.   Acute on chronic normocytic anemia Baseline hemoglobin 9-10 Component  of iron  deficiency anemia. Received 1 unit of packed red blood cell. Monitor H&H.   Neurogenic  bladder: Suprapubic catheter, continue foley care.   Hyperlipidemia: Continue lipitor 40mg  daily    GERD (gastroesophageal reflux disease): Continue pepcid  BID.   Colostomy present [facility]) Colostomy care  .   Chronic osteomyelitis of pelvic region, right [facility]) Seen by ID in April of 2025    Obstructive sleep apnea: States he no longer uses cpap at night.     FTT; poor oral intake . In setting acute illness and delirium.  Continue Glucerna.  Start Megace.    Delirium; acute encephalopathy He has been more sleepy.  Suspect post surgery, anesthesia, infection process.  Will change baclofen  to PRN.  Mental status back to baseline.   See wound care documentation below Wound 06/15/24 2300 Pressure Injury Buttocks Right;Left;Lower;Mid;Upper Stage 4 - Full thickness tissue loss with exposed bone, tendon or muscle. (Active)     Wound 06/15/24 2300 Pressure Injury Thigh Left;Posterior;Proximal (Active)        Estimated body mass index is 20.34 kg/m as calculated from the following:   Height as of this encounter: 6' (1.829 m).   Weight as of this encounter: 68 kg.   DVT prophylaxis: Lovenox  Code Status: Full code Family Communication: No family at bed side Disposition Plan:    Status is: Inpatient Remains inpatient appropriate because: Severity of illness.    Consultants:  General Surgery Infectious disease  Procedures:S/P I& D  Antimicrobials:  Anti-infectives (From admission, onward)    Start     Dose/Rate Route Frequency Ordered Stop   06/21/24 1600  ceFEPIme  (MAXIPIME ) 2 g in sodium chloride  0.9 % 100 mL IVPB        2 g 200 mL/hr over 30 Minutes Intravenous Every 8 hours 06/21/24 1507     06/21/24 1000  Ampicillin -Sulbactam (UNASYN ) 3 g in sodium chloride  0.9 % 100 mL IVPB  Status:  Discontinued        3 g 200 mL/hr over 30 Minutes Intravenous Every 6 hours 06/21/24 0822 06/21/24 1507   06/20/24 1000  doxycycline  (VIBRAMYCIN ) 100 mg in sodium chloride  0.9 % 250 mL IVPB  Status:  Discontinued        100 mg 125 mL/hr over 120 Minutes Intravenous Every 12 hours 06/20/24 0840 06/21/24 1507   06/20/24 0900  Ampicillin -Sulbactam (UNASYN ) 3 g in sodium chloride  0.9 % 100  mL IVPB  Status:  Discontinued        3 g 200 mL/hr over 30 Minutes Intravenous Every 6 hours 06/20/24 0840 06/21/24 0822   06/16/24 2000  ceFEPIme  (MAXIPIME ) 2 g in sodium chloride  0.9 % 100 mL IVPB  Status:  Discontinued        2 g 200 mL/hr over 30 Minutes Intravenous Every 12 hours 06/16/24 1408 06/20/24 0840   06/16/24 1500  vancomycin  (VANCOCIN ) IVPB 1000 mg/200 mL premix  Status:  Discontinued        1,000 mg 200 mL/hr over 60 Minutes Intravenous Every 24 hours 06/15/24 1731 06/16/24 1353   06/16/24 1500  vancomycin  (VANCOREADY) IVPB 1250 mg/250 mL  Status:  Discontinued        1,250 mg 166.7 mL/hr over 90 Minutes Intravenous Every 24 hours 06/16/24 1353 06/20/24 0840   06/16/24 1400  ceFEPIme  (MAXIPIME ) 2 g in sodium chloride  0.9 % 100 mL IVPB  Status:  Discontinued        2 g 200 mL/hr over 30 Minutes Intravenous Every 8 hours 06/16/24 1353 06/16/24 1408   06/16/24 0600  ceFEPIme  (MAXIPIME ) 2 g in sodium chloride  0.9 % 100  mL IVPB  Status:  Discontinued        2 g 200 mL/hr over 30 Minutes Intravenous Every 12 hours 06/15/24 1730 06/16/24 1353   06/15/24 1330  vancomycin  (VANCOCIN ) IVPB 1000 mg/200 mL premix        1,000 mg 200 mL/hr over 60 Minutes Intravenous  Once 06/15/24 1329 06/15/24 1601   06/15/24 1330  ceFEPIme  (MAXIPIME ) 2 g in sodium chloride  0.9 % 100 mL IVPB        2 g 200 mL/hr over 30 Minutes Intravenous  Once 06/15/24 1329 06/15/24 1825   06/15/24 1300  cefTRIAXone  (ROCEPHIN ) 1 g in sodium chloride  0.9 % 100 mL IVPB  Status:  Discontinued        1 g 200 mL/hr over 30 Minutes Intravenous  Once 06/15/24 1259 06/15/24 1340      Subjective: Patient was seen and examined at bedside. Overnight events noted. Patient was lying comfortably on the bed, reports he feels better and he is ready to be discharged back to Facility.  Objective: Vitals:   06/21/24 1351 06/21/24 1738 06/21/24 2108 06/22/24 0424  BP: (!) 180/67 (!) 148/56 (!) 141/58 (!) 131/55  Pulse: 66  63 64 62  Resp: 18 16 18 16   Temp: 98.8 F (37.1 C) 99.4 F (37.4 C) 98.9 F (37.2 C) 98.1 F (36.7 C)  TempSrc: Oral Oral  Oral  SpO2: 100% 100% 99% 97%  Weight:      Height:        Intake/Output Summary (Last 24 hours) at 06/22/2024 1211 Last data filed at 06/22/2024 0938 Gross per 24 hour  Intake 1060 ml  Output 1900 ml  Net -840 ml   Filed Weights   06/15/24 1221 06/18/24 0829  Weight: 68 kg 68 kg    Examination:  General exam: Appears calm and comfortable, not in any acute distress, deconditioned. Respiratory system: CTA Bilaterally. Respiratory effort normal.  RR 12 Cardiovascular system: S1 & S2 heard, RRR. No JVD, murmurs, rubs, gallops or clicks.  Gastrointestinal system: Abdomen is non distended, soft and non tender.  Normal bowel sounds heard. Central nervous system: Alert and oriented x 3. No focal neurological deficits. Extremities: No edema, no cyanosis, no clubbing. Skin: No rashes, lesions or ulcers Psychiatry: Judgement and insight appear normal. Mood & affect appropriate.     Data Reviewed: I have personally reviewed following labs and imaging studies  CBC: Recent Labs  Lab 06/15/24 1224 06/16/24 0438 06/18/24 1342 06/19/24 0458 06/20/24 0601 06/21/24 0447 06/22/24 0555  WBC 23.9*   < > 12.2* 11.9* 9.5 11.5* 10.0  NEUTROABS 20.1*  --   --   --   --   --   --   HGB 8.6*   < > 7.2* 9.1* 8.7* 8.9* 8.9*  HCT 28.8*   < > 25.9* 30.4* 29.8* 30.0* 30.2*  MCV 78.5*   < > 81.2 82.4 83.7 83.1 81.4  PLT 450*   < > 369 394 376 351 327   < > = values in this interval not displayed.   Basic Metabolic Panel: Recent Labs  Lab 06/18/24 0555 06/19/24 0458 06/20/24 0601 06/21/24 0447 06/22/24 0555  NA 136 138 137 141 135  K 3.3* 3.0* 2.9* 3.6 3.4*  CL 107 109 111 113* 108  CO2 18* 18* 17* 19* 20*  GLUCOSE 271* 189* 257* 196* 173*  BUN 27* 19 14 11 9   CREATININE 0.63 0.56* 0.49* 0.50* 0.44*  CALCIUM  8.7* 8.5* 8.4* 8.3* 8.3*  MG  --   --   --   --   1.6*  PHOS 3.4 2.9 2.7  --  1.9*   GFR: Estimated Creatinine Clearance: 72 mL/min (A) (by C-G formula based on SCr of 0.44 mg/dL (L)). Liver Function Tests: Recent Labs  Lab 06/15/24 1224 06/16/24 0438 06/18/24 0555 06/19/24 0458 06/20/24 0601  AST 40 35  --   --   --   ALT 48* 37  --   --   --   ALKPHOS 157* 159*  --   --   --   BILITOT 0.5 0.2  --   --   --   PROT 6.7 5.9*  --   --   --   ALBUMIN  2.7* 2.3* 2.4* 2.2* 2.1*   No results for input(s): LIPASE, AMYLASE in the last 168 hours. No results for input(s): AMMONIA in the last 168 hours. Coagulation Profile: Recent Labs  Lab 06/15/24 1224  INR 1.3*   Cardiac Enzymes: No results for input(s): CKTOTAL, CKMB, CKMBINDEX, TROPONINI in the last 168 hours. BNP (last 3 results) No results for input(s): PROBNP in the last 8760 hours. HbA1C: No results for input(s): HGBA1C in the last 72 hours. CBG: Recent Labs  Lab 06/21/24 0733 06/21/24 1201 06/21/24 1610 06/21/24 2107 06/22/24 0923  GLUCAP 173* 156* 189* 174* 171*   Lipid Profile: No results for input(s): CHOL, HDL, LDLCALC, TRIG, CHOLHDL, LDLDIRECT in the last 72 hours. Thyroid  Function Tests: No results for input(s): TSH, T4TOTAL, FREET4, T3FREE, THYROIDAB in the last 72 hours. Anemia Panel: No results for input(s): VITAMINB12, FOLATE, FERRITIN, TIBC, IRON , RETICCTPCT in the last 72 hours. Sepsis Labs: Recent Labs  Lab 06/15/24 1224 06/15/24 1248  PROCALCITON 2.23  --   LATICACIDVEN  --  1.8    Recent Results (from the past 240 hours)  Resp panel by RT-PCR (RSV, Flu A&B, Covid) Anterior Nasal Swab     Status: None   Collection Time: 06/15/24 12:24 PM   Specimen: Anterior Nasal Swab  Result Value Ref Range Status   SARS Coronavirus 2 by RT PCR NEGATIVE NEGATIVE Final    Comment: (NOTE) SARS-CoV-2 target nucleic acids are NOT DETECTED.  The SARS-CoV-2 RNA is generally detectable in upper  respiratory specimens during the acute phase of infection. The lowest concentration of SARS-CoV-2 viral copies this assay can detect is 138 copies/mL. A negative result does not preclude SARS-Cov-2 infection and should not be used as the sole basis for treatment or other patient management decisions. A negative result may occur with  improper specimen collection/handling, submission of specimen other than nasopharyngeal swab, presence of viral mutation(s) within the areas targeted by this assay, and inadequate number of viral copies(<138 copies/mL). A negative result must be combined with clinical observations, patient history, and epidemiological information. The expected result is Negative.  Fact Sheet for Patients:  BloggerCourse.com  Fact Sheet for Healthcare Providers:  SeriousBroker.it  This test is no t yet approved or cleared by the [place]  FDA and  has been authorized for detection and/or diagnosis of SARS-CoV-2 by FDA under an Emergency Use Authorization (EUA). This EUA will remain  in effect (meaning this test can be used) for the duration of the COVID-19 declaration under Section 564(b)(1) of the Act, 21 U.S.C.section 360bbb-3(b)(1), unless the authorization is terminated  or revoked sooner.       Influenza A by PCR NEGATIVE NEGATIVE Final   Influenza B by PCR NEGATIVE NEGATIVE Final    Comment: (NOTE) The  Xpert Xpress SARS-CoV-2/FLU/RSV plus assay is intended as an aid in the diagnosis of influenza from Nasopharyngeal swab specimens and should not be used as a sole basis for treatment. Nasal washings and aspirates are unacceptable for Xpert Xpress SARS-CoV-2/FLU/RSV testing.  Fact Sheet for Patients: BloggerCourse.com  Fact Sheet for Healthcare Providers: SeriousBroker.it  This test is not yet approved or cleared by the [place]  FDA and has been  authorized for detection and/or diagnosis of SARS-CoV-2 by FDA under an Emergency Use Authorization (EUA). This EUA will remain in effect (meaning this test can be used) for the duration of the COVID-19 declaration under Section 564(b)(1) of the Act, 21 U.S.C. section 360bbb-3(b)(1), unless the authorization is terminated or revoked.     Resp Syncytial Virus by PCR NEGATIVE NEGATIVE Final    Comment: (NOTE) Fact Sheet for Patients: BloggerCourse.com  Fact Sheet for Healthcare Providers: SeriousBroker.it  This test is not yet approved or cleared by the [place]  FDA and has been authorized for detection and/or diagnosis of SARS-CoV-2 by FDA under an Emergency Use Authorization (EUA). This EUA will remain in effect (meaning this test can be used) for the duration of the COVID-19 declaration under Section 564(b)(1) of the Act, 21 U.S.C. section 360bbb-3(b)(1), unless the authorization is terminated or revoked.  Performed at [facility], [address] [place], [place] [address]   Urine Culture     Status: Abnormal   Collection Time: 06/15/24 12:25 PM   Specimen: Urine, Random  Result Value Ref Range Status   Specimen Description   Final    URINE, RANDOM Performed at [facility], [address] [place], [place] [address]    Special Requests   Final    NONE Reflexed from [id number] Performed at [facility], [address], [place], [place] [address]    Culture >=100,000 COLONIES/mL PROTEUS MIRABILIS (A)  Final   Report Status 06/18/2024 FINAL  Final   Organism ID, Bacteria PROTEUS MIRABILIS (A)  Final      Susceptibility   Proteus mirabilis - MIC*    AMPICILLIN  <=2 SENSITIVE Sensitive     CEFAZOLIN  (URINE) Value in next row Sensitive      4 SENSITIVEThis is a modified FDA-approved test that has been validated and its performance characteristics determined by the  reporting laboratory.  This laboratory is certified under the Clinical Laboratory Improvement Amendments CLIA as qualified to perform high complexity clinical laboratory testing.    CEFEPIME  Value in next row Sensitive      4 SENSITIVEThis is a modified FDA-approved test that has been validated and its performance characteristics determined by the reporting laboratory.  This laboratory is certified under the Clinical Laboratory Improvement Amendments CLIA as qualified to perform high complexity clinical laboratory testing.    ERTAPENEM Value in next row Sensitive      4 SENSITIVEThis is a modified FDA-approved test that has been validated and its performance characteristics determined by the reporting laboratory.  This laboratory is certified under the Clinical Laboratory Improvement Amendments CLIA as qualified to perform high complexity clinical laboratory testing.    CEFTRIAXONE  Value in next row Sensitive      4 SENSITIVEThis is a modified FDA-approved test that has been validated and its performance characteristics determined by the reporting laboratory.  This laboratory is certified under the Clinical Laboratory Improvement Amendments CLIA as qualified to perform high complexity clinical laboratory testing.    CIPROFLOXACIN  Value in next row Sensitive  4 SENSITIVEThis is a modified FDA-approved test that has been validated and its performance characteristics determined by the reporting laboratory.  This laboratory is certified under the Clinical Laboratory Improvement Amendments CLIA as qualified to perform high complexity clinical laboratory testing.    GENTAMICIN  Value in next row Sensitive      4 SENSITIVEThis is a modified FDA-approved test that has been validated and its performance characteristics determined by the reporting laboratory.  This laboratory is certified under the Clinical Laboratory Improvement Amendments CLIA as qualified to perform high complexity clinical laboratory testing.     NITROFURANTOIN Value in next row Resistant      4 SENSITIVEThis is a modified FDA-approved test that has been validated and its performance characteristics determined by the reporting laboratory.  This laboratory is certified under the Clinical Laboratory Improvement Amendments CLIA as qualified to perform high complexity clinical laboratory testing.    TRIMETH /SULFA  Value in next row Resistant      4 SENSITIVEThis is a modified FDA-approved test that has been validated and its performance characteristics determined by the reporting laboratory.  This laboratory is certified under the Clinical Laboratory Improvement Amendments CLIA as qualified to perform high complexity clinical laboratory testing.    AMPICILLIN /SULBACTAM Value in next row Sensitive      4 SENSITIVEThis is a modified FDA-approved test that has been validated and its performance characteristics determined by the reporting laboratory.  This laboratory is certified under the Clinical Laboratory Improvement Amendments CLIA as qualified to perform high complexity clinical laboratory testing.    PIP/TAZO Value in next row Sensitive      <=4 SENSITIVEThis is a modified FDA-approved test that has been validated and its performance characteristics determined by the reporting laboratory.  This laboratory is certified under the Clinical Laboratory Improvement Amendments CLIA as qualified to perform high complexity clinical laboratory testing.    MEROPENEM  Value in next row Sensitive      <=4 SENSITIVEThis is a modified FDA-approved test that has been validated and its performance characteristics determined by the reporting laboratory.  This laboratory is certified under the Clinical Laboratory Improvement Amendments CLIA as qualified to perform high complexity clinical laboratory testing.    * >=100,000 COLONIES/mL PROTEUS MIRABILIS  Blood Culture (routine x 2)     Status: None   Collection Time: 06/15/24 12:26 PM   Specimen: BLOOD LEFT  FOREARM  Result Value Ref Range Status   Specimen Description   Final    BLOOD LEFT FOREARM Performed at [facility], [address], [place], [place] [address]    Special Requests   Final    BOTTLES DRAWN AEROBIC AND ANAEROBIC Blood Culture adequate volume Performed at [facility], [address], [place], [place] [address]    Culture   Final    NO GROWTH 5 DAYS Performed at [facility], [address], [place], [place] [address]    Report Status 06/21/2024 FINAL  Final  Blood Culture (routine x 2)     Status: Abnormal   Collection Time: 06/15/24 12:27 PM   Specimen: BLOOD RIGHT FOREARM  Result Value Ref Range Status   Specimen Description   Final    BLOOD RIGHT FOREARM Performed at [facility], [address] [place], [place] [address]    Special Requests   Final    BOTTLES DRAWN AEROBIC AND ANAEROBIC Blood Culture adequate volume Performed at [facility] [address], [place], [place] [address]  Culture  Setup Time   Final    GRAM POSITIVE COCCI ANAEROBIC BOTTLE ONLY CRITICAL RESULT CALLED TO, READ BACK BY AND VERIFIED WITH: LEANNE POINDEXTER ON 06/16/24 AT 0629 QSD    Culture (A)  Final    STAPHYLOCOCCUS WARNERI THE SIGNIFICANCE OF ISOLATING THIS ORGANISM FROM A SINGLE SET OF BLOOD CULTURES WHEN MULTIPLE SETS ARE DRAWN IS UNCERTAIN. PLEASE NOTIFY THE MICROBIOLOGY DEPARTMENT WITHIN ONE WEEK IF SPECIATION AND SENSITIVITIES ARE REQUIRED. Performed at [facility], [address], [place], [place] [address]    Report Status 06/18/2024 FINAL  Final  Blood Culture ID Panel (Reflexed)     Status: Abnormal   Collection Time: 06/15/24 12:27 PM  Result Value Ref Range Status   Enterococcus faecalis NOT DETECTED NOT DETECTED Final   Enterococcus Faecium NOT DETECTED NOT DETECTED Final   Listeria monocytogenes NOT DETECTED NOT DETECTED Final   Staphylococcus species DETECTED (A) NOT DETECTED Final    Comment:  CRITICAL RESULT CALLED TO, READ BACK BY AND VERIFIED WITH: LEANNE POINDEXTER ON 06/16/24 AT 9370 QSD    Staphylococcus aureus (BCID) NOT DETECTED NOT DETECTED Final   Staphylococcus epidermidis NOT DETECTED NOT DETECTED Final   Staphylococcus lugdunensis NOT DETECTED NOT DETECTED Final   Streptococcus species NOT DETECTED NOT DETECTED Final   Streptococcus agalactiae NOT DETECTED NOT DETECTED Final   Streptococcus pneumoniae NOT DETECTED NOT DETECTED Final   Streptococcus pyogenes NOT DETECTED NOT DETECTED Final   A.calcoaceticus-baumannii NOT DETECTED NOT DETECTED Final   Bacteroides fragilis NOT DETECTED NOT DETECTED Final   Enterobacterales NOT DETECTED NOT DETECTED Final   Enterobacter cloacae complex NOT DETECTED NOT DETECTED Final   Escherichia coli NOT DETECTED NOT DETECTED Final   Klebsiella aerogenes NOT DETECTED NOT DETECTED Final   Klebsiella oxytoca NOT DETECTED NOT DETECTED Final   Klebsiella pneumoniae NOT DETECTED NOT DETECTED Final   Proteus species NOT DETECTED NOT DETECTED Final   Salmonella species NOT DETECTED NOT DETECTED Final   Serratia marcescens NOT DETECTED NOT DETECTED Final   Haemophilus influenzae NOT DETECTED NOT DETECTED Final   Neisseria meningitidis NOT DETECTED NOT DETECTED Final   Pseudomonas aeruginosa NOT DETECTED NOT DETECTED Final   Stenotrophomonas maltophilia NOT DETECTED NOT DETECTED Final   Candida albicans NOT DETECTED NOT DETECTED Final   Candida auris NOT DETECTED NOT DETECTED Final   Candida glabrata NOT DETECTED NOT DETECTED Final   Candida krusei NOT DETECTED NOT DETECTED Final   Candida parapsilosis NOT DETECTED NOT DETECTED Final   Candida tropicalis NOT DETECTED NOT DETECTED Final   Cryptococcus neoformans/gattii NOT DETECTED NOT DETECTED Final    Comment: Performed at [facility], [address] [place], [place] [address]  MRSA Next Gen by PCR, Nasal     Status: Abnormal   Collection Time: 06/16/24  4:42 AM   Specimen:  Nasal Mucosa; Nasal Swab  Result Value Ref Range Status   MRSA by PCR Next Gen DETECTED (A) NOT DETECTED Final    Comment: Results Called to: [employer], J RN AT 1107 ON 06/16/2024 BY PRUDY, K (NOTE) The GeneXpert MRSA Assay (FDA approved for NASAL specimens only), is one component of a comprehensive MRSA colonization surveillance program. It is not intended to diagnose MRSA infection nor to guide or monitor treatment for MRSA infections. Test performance is not FDA approved in patients less than 54 years old. Performed at [facility], [address] [place], [place] [address]   Aerobic/Anaerobic Culture w Gram Stain (surgical/deep wound)  Status: None (Preliminary result)   Collection Time: 06/18/24 10:40 AM   Specimen: Wound; Abscess  Result Value Ref Range Status   Specimen Description   Final    WOUND Performed at [facility], [address] [place], [place] [address]    Special Requests   Final    LEFT POSTERIOR THIGH MASS Performed at [facility], [address], [place], [place] [address]    Gram Stain   Final    MODERATE WBC PRESENT, PREDOMINANTLY PMN RARE GRAM POSITIVE COCCI IN CLUSTERS RARE GRAM NEGATIVE RODS    Culture   Final    RARE PROTEUS MIRABILIS RARE ESCHERICHIA COLI HOLDING FOR POSSIBLE ANAEROBE Performed at [facility], [address] [place], [place] [address]    Report Status PENDING  Incomplete   Organism ID, Bacteria PROTEUS MIRABILIS  Final   Organism ID, Bacteria ESCHERICHIA COLI  Final      Susceptibility   Escherichia coli - MIC*    AMPICILLIN  >=32 RESISTANT Resistant     CEFAZOLIN  (NON-URINE) 4 INTERMEDIATE Intermediate     CEFEPIME  <=0.12 SENSITIVE Sensitive     ERTAPENEM <=0.12 SENSITIVE Sensitive     CEFTRIAXONE  <=0.25 SENSITIVE Sensitive     CIPROFLOXACIN  >=4 RESISTANT Resistant     GENTAMICIN  <=1 SENSITIVE Sensitive     MEROPENEM  <=0.25 SENSITIVE Sensitive      TRIMETH /SULFA  <=20 SENSITIVE Sensitive     AMPICILLIN /SULBACTAM 16 INTERMEDIATE Intermediate     PIP/TAZO Value in next row Sensitive      <=4 SENSITIVEThis is a modified FDA-approved test that has been validated and its performance characteristics determined by the reporting laboratory.  This laboratory is certified under the Clinical Laboratory Improvement Amendments CLIA as qualified to perform high complexity clinical laboratory testing.    * RARE ESCHERICHIA COLI   Proteus mirabilis - MIC*    AMPICILLIN  Value in next row Sensitive      <=4 SENSITIVEThis is a modified FDA-approved test that has been validated and its performance characteristics determined by the reporting laboratory.  This laboratory is certified under the Clinical Laboratory Improvement Amendments CLIA as qualified to perform high complexity clinical laboratory testing.    CEFAZOLIN  (NON-URINE) Value in next row Intermediate      <=4 SENSITIVEThis is a modified FDA-approved test that has been validated and its performance characteristics determined by the reporting laboratory.  This laboratory is certified under the Clinical Laboratory Improvement Amendments CLIA as qualified to perform high complexity clinical laboratory testing.    CEFEPIME  Value in next row Sensitive      <=4 SENSITIVEThis is a modified FDA-approved test that has been validated and its performance characteristics determined by the reporting laboratory.  This laboratory is certified under the Clinical Laboratory Improvement Amendments CLIA as qualified to perform high complexity clinical laboratory testing.    ERTAPENEM Value in next row Sensitive      <=4 SENSITIVEThis is a modified FDA-approved test that has been validated and its performance characteristics determined by the reporting laboratory.  This laboratory is certified under the Clinical Laboratory Improvement Amendments CLIA as qualified to perform high complexity clinical laboratory testing.     CEFTRIAXONE  Value in next row Sensitive      <=4 SENSITIVEThis is a modified FDA-approved test that has been validated and its performance characteristics determined by the reporting laboratory.  This laboratory is certified under the Clinical Laboratory Improvement Amendments CLIA as qualified to perform high complexity clinical laboratory testing.  CIPROFLOXACIN  Value in next row Sensitive      <=4 SENSITIVEThis is a modified FDA-approved test that has been validated and its performance characteristics determined by the reporting laboratory.  This laboratory is certified under the Clinical Laboratory Improvement Amendments CLIA as qualified to perform high complexity clinical laboratory testing.    GENTAMICIN  Value in next row Sensitive      <=4 SENSITIVEThis is a modified FDA-approved test that has been validated and its performance characteristics determined by the reporting laboratory.  This laboratory is certified under the Clinical Laboratory Improvement Amendments CLIA as qualified to perform high complexity clinical laboratory testing.    MEROPENEM  Value in next row Sensitive      <=4 SENSITIVEThis is a modified FDA-approved test that has been validated and its performance characteristics determined by the reporting laboratory.  This laboratory is certified under the Clinical Laboratory Improvement Amendments CLIA as qualified to perform high complexity clinical laboratory testing.    TRIMETH /SULFA  Value in next row Resistant      <=4 SENSITIVEThis is a modified FDA-approved test that has been validated and its performance characteristics determined by the reporting laboratory.  This laboratory is certified under the Clinical Laboratory Improvement Amendments CLIA as qualified to perform high complexity clinical laboratory testing.    AMPICILLIN /SULBACTAM Value in next row Sensitive      <=4 SENSITIVEThis is a modified FDA-approved test that has been validated and its performance  characteristics determined by the reporting laboratory.  This laboratory is certified under the Clinical Laboratory Improvement Amendments CLIA as qualified to perform high complexity clinical laboratory testing.    PIP/TAZO Value in next row Sensitive      <=4 SENSITIVEThis is a modified FDA-approved test that has been validated and its performance characteristics determined by the reporting laboratory.  This laboratory is certified under the Clinical Laboratory Improvement Amendments CLIA as qualified to perform high complexity clinical laboratory testing.    * RARE PROTEUS MIRABILIS    Radiology Studies: No results found.  Scheduled Meds:  artificial tears  1 drop Both Eyes Daily   ascorbic acid   500 mg Oral Daily   atorvastatin   40 mg Oral QHS   azelastine  2 spray Each Nare Daily   cholecalciferol   2,000 Units Oral Daily   DULoxetine   30 mg Oral QHS   enoxaparin  (LOVENOX ) injection  40 mg Subcutaneous QHS   famotidine   20 mg Oral BID   feeding supplement (GLUCERNA SHAKE)  237 mL Oral TID BM   ferrous sulfate   325 mg Oral Q breakfast   fluticasone   1 spray Each Nare QHS   insulin  aspart  0-15 Units Subcutaneous TID WC   insulin  aspart  0-5 Units Subcutaneous QHS   insulin  glargine  7 Units Subcutaneous QHS   lamoTRIgine   50 mg Oral BID   loratadine   10 mg Oral Daily   megestrol  40 mg Oral Daily   midodrine   10 mg Oral TID WC   nystatin   5 mL Oral QID   olopatadine   1 drop Both Eyes BID   senna-docusate  1 tablet Oral BID   thiamine  100 mg Oral Daily   traZODone   100 mg Oral QHS   zinc  sulfate (50mg  elemental zinc )  220 mg Oral Daily   Continuous Infusions:  ceFEPime  (MAXIPIME ) IV 2 g (06/22/24 0920)     LOS: 7 days    Time spent: 50 mins    Darcel Dawley, MD [facility]   If 7PM-7AM, please contact  night-coverage

## 2024-06-22 NOTE — Plan of Care (Signed)
  Problem: Clinical Measurements: Goal: Respiratory complications will improve Outcome: Progressing   Problem: Pain Managment: Goal: General experience of comfort will improve and/or be controlled Outcome: Progressing   Problem: Safety: Goal: Ability to remain free from injury will improve Outcome: Progressing   Problem: Education: Goal: Ability to describe self-care measures that may prevent or decrease complications (Diabetes Survival Skills Education) will improve Outcome: Not Progressing   Problem: Coping: Goal: Ability to adjust to condition or change in health will improve Outcome: Not Progressing   Problem: Fluid Volume: Goal: Ability to maintain a balanced intake and output will improve Outcome: Not Progressing   Problem: Metabolic: Goal: Ability to maintain appropriate glucose levels will improve Outcome: Not Progressing   Problem: Nutritional: Goal: Maintenance of adequate nutrition will improve Outcome: Not Progressing   Problem: Tissue Perfusion: Goal: Adequacy of tissue perfusion will improve Outcome: Not Progressing   Problem: Activity: Goal: Risk for activity intolerance will decrease Outcome: Not Progressing   Problem: Nutrition: Goal: Adequate nutrition will be maintained Outcome: Not Progressing   Problem: Coping: Goal: Level of anxiety will decrease Outcome: Not Progressing   Problem: Skin Integrity: Goal: Risk for impaired skin integrity will decrease Outcome: Not Progressing

## 2024-06-23 DIAGNOSIS — R6520 Severe sepsis without septic shock: Secondary | ICD-10-CM | POA: Diagnosis not present

## 2024-06-23 DIAGNOSIS — A419 Sepsis, unspecified organism: Secondary | ICD-10-CM | POA: Diagnosis not present

## 2024-06-23 LAB — CBC
HCT: 30.2 % — ABNORMAL LOW (ref 39.0–52.0)
Hemoglobin: 8.9 g/dL — ABNORMAL LOW (ref 13.0–17.0)
MCH: 23.9 pg — ABNORMAL LOW (ref 26.0–34.0)
MCHC: 29.5 g/dL — ABNORMAL LOW (ref 30.0–36.0)
MCV: 81 fL (ref 80.0–100.0)
Platelets: 289 K/uL (ref 150–400)
RBC: 3.73 MIL/uL — ABNORMAL LOW (ref 4.22–5.81)
RDW: 19.1 % — ABNORMAL HIGH (ref 11.5–15.5)
WBC: 9.8 K/uL (ref 4.0–10.5)
nRBC: 0 % (ref 0.0–0.2)

## 2024-06-23 LAB — AEROBIC/ANAEROBIC CULTURE W GRAM STAIN (SURGICAL/DEEP WOUND)

## 2024-06-23 LAB — COMPREHENSIVE METABOLIC PANEL WITH GFR
ALT: 19 U/L (ref 0–44)
AST: 24 U/L (ref 15–41)
Albumin: 2.3 g/dL — ABNORMAL LOW (ref 3.5–5.0)
Alkaline Phosphatase: 107 U/L (ref 38–126)
Anion gap: 7 (ref 5–15)
BUN: 9 mg/dL (ref 8–23)
CO2: 22 mmol/L (ref 22–32)
Calcium: 8.5 mg/dL — ABNORMAL LOW (ref 8.9–10.3)
Chloride: 107 mmol/L (ref 98–111)
Creatinine, Ser: 0.45 mg/dL — ABNORMAL LOW (ref 0.61–1.24)
GFR, Estimated: >60 mL/min (ref >60)
Glucose, Bld: 136 mg/dL — ABNORMAL HIGH (ref 70–99)
Potassium: 3.4 mmol/L — ABNORMAL LOW (ref 3.5–5.1)
Sodium: 136 mmol/L (ref 135–145)
Total Bilirubin: 0.2 mg/dL (ref 0.0–1.2)
Total Protein: 5.5 g/dL — ABNORMAL LOW (ref 6.5–8.1)

## 2024-06-23 LAB — MAGNESIUM: Magnesium: 1.7 mg/dL (ref 1.7–2.4)

## 2024-06-23 LAB — GLUCOSE, CAPILLARY
Glucose-Capillary: 134 mg/dL — ABNORMAL HIGH (ref 70–99)
Glucose-Capillary: 160 mg/dL — ABNORMAL HIGH (ref 70–99)
Glucose-Capillary: 136 mg/dL — ABNORMAL HIGH (ref 70–99)
Glucose-Capillary: 150 mg/dL — ABNORMAL HIGH (ref 70–99)

## 2024-06-23 LAB — PHOSPHORUS: Phosphorus: 2.9 mg/dL (ref 2.5–4.6)

## 2024-06-23 MED ORDER — POTASSIUM CHLORIDE CRYS ER 20 MEQ PO TBCR
40.0000 meq | EXTENDED_RELEASE_TABLET | Freq: Once | ORAL | Status: AC
  Administered 2024-06-23: 40 meq via ORAL
  Filled 2024-06-23: qty 2

## 2024-06-23 NOTE — Hospital Course (Addendum)
 [name]

## 2024-06-23 NOTE — Progress Notes (Addendum)
 PT Cancellation Note  Patient Details Name: Brent [name]. MRN: [id number] DOB: February 29, 1946   Cancelled Treatment:    Reason Eval/Treat Not Completed: PT screened, no needs identified. Please see OT/PT notes from 10/3. OOB<>chair with nursing using hoyer lift if pt wishes to mobilize. Per TOC note, pt is [facility] resident with plans to return. Will sign off.    Dannial SQUIBB, PT Acute Rehabilitation  Office: [phone]

## 2024-06-23 NOTE — Progress Notes (Signed)
 PROGRESS NOTE  Brent [name]. FMW:[id number] DOB: 1946-03-28 DOA: 06/15/2024 PCP: Feliciano Devoria LABOR, MD   LOS: 8 days   Brief narrative:  Patient is a 78 years old male with PMH significant for CAD status post DES, diabetes type 2, hypertension, hyperlipidemia, history of melanoma, OSA, transverse myelitis, stage IV sacral decubitus ulcer, history of chronic OM, colostomy and suprapubic catheter presented to the ED with concerns of worsening decubitus ulcer and fatigue.[name]  He has had stage IV ulcers for 4-years, wound appears to be getting worse, patient was also reported to be confused at the rehab facility with decreased oral intake and nausea for few weeks. In the ED, CT scan of the abdomen and pelvis showed large decubitus ulcer overlying the sacrum and bilateral ischial tuberosity.  Finding compatible with acute osteomyelitis within the left ischium and chronic osteomyelitis within the right ischium.  New 4 x 6 cm complex fluid collection within the subcutaneous tissue posterior to the left thigh, immediately inferior to the left ischial decubitus ulcer consistent with abscess.  General surgery was consulted and patient underwent wound exploration with incision and drainage and open packing of the left posterior thigh abscess on 9/30        Assessment/Plan: Principal Problem:   Severe sepsis secondary to acute OM of left ischium and posterior thigh abscess Active Problems:   AKI (acute kidney injury)   DM type 2 (diabetes mellitus, type 2) (HCC)   (HFpEF) heart failure with preserved ejection fraction (HCC)   Essential hypertension   Coronary artery disease involving native coronary artery of native heart without angina pectoris   Normocytic anemia   Neurogenic bladder   Hyperlipidemia   GERD (gastroesophageal reflux disease)   Colostomy present (HCC)   Chronic osteomyelitis of pelvic region, right (HCC)   Obstructive sleep apnea   Thigh abscess   Acute osteomyelitis involving  pelvic region and thigh, left (HCC)   Wound infection  Severe sepsis secondary to Acute osteomyelitis of the left ischium and posterior thigh abscess: Staph warneri + bacteremia-likely to be contaminant Stage IV right and left lower mid upper decubitus ulcer and left thigh pressure posterior abscess: Continue IV antibiotics.  Status post wound exploration with incision and drainage and open packing active left posterior thigh abscess by surgery on 9/30.  Plan is to transition to oral Doxycycline  and Augmentin  for 7 days   Acute kidney injury: > Resolved. Initial creatinine of 1.4.  Creatinine today at 3.4.   Hypokalemia:  Potassium today at 3.4.  Will replenish orally.   Proteus UTI: Urine culture growing 100,000 colonies of Proteus mirabilis.  Patient was initially on cefepime .  Currently on doxycycline  and Augmentin .   ' Diabetes type 2 with Hyperglycemia: Continue to hold metformin .  Last Hb A1c 7.1. Continue sliding scale and Semglee .  Latest POC glucose of 134   Chronic diastolic heart failure with preserved ejection fraction: Lasix  on hold due to poor oral intake.   Essential hypertension: Diuretics and antihypertensives on hold.  Reconsider by a.m. if needed.   Coronary artery disease involving native coronary artery of native heart without angina pectoris: No acute issues.  Continue Lipitor.   Acute on chronic normocytic anemia Baseline hemoglobin 9-10 Latest hemoglobin of 8.9.  Received 1 unit of packed RBC during hospitalization.   Neurogenic bladder: Status post suprapubic catheter, continue foley care.   Hyperlipidemia: Continue Lipitor   GERD (gastroesophageal reflux disease): Continue pepcid  BID.   Colostomy present Colostomy care .   Chronic osteomyelitis of  pelvic region, right Seen by ID in April of 2025    Obstructive sleep apnea: Not on CPAP    FTT; poor oral intake . Continue Glucerna and Megace  Delirium with acute metabolic  encephalopathy Improved.  Likely secondary to infection  Deconditioning debility.  Plan for skilled nursing facility on discharge  Wound 06/15/24 2300 Pressure Injury Buttocks Right;Left;Lower;Mid;Upper Stage 4 - Full thickness tissue loss with exposed bone, tendon or muscle. (Active)     Wound 06/15/24 2300 Pressure Injury Thigh Left;Posterior;Proximal (Active)      DVT prophylaxis: enoxaparin  (LOVENOX ) injection 40 mg Start: 06/15/24 2200   Disposition: Skilled nursing facility as per PT evaluation.  Medically stable for disposition  Status is: Inpatient Remains inpatient appropriate because: Need for skilled nursing facility placement    Code Status:     Code Status: Full Code  Family Communication: None at bedside  Consultants: General Surgery Infectious disease  Procedures: Incision and drainage  Anti-infectives:  Augmentin  and doxycycline   Anti-infectives (From admission, onward)    Start     Dose/Rate Route Frequency Ordered Stop   06/22/24 1600  amoxicillin -clavulanate (AUGMENTIN ) 875-125 MG per tablet 1 tablet        1 tablet Oral Every 12 hours 06/22/24 1400 06/25/24 0959   06/22/24 1415  doxycycline  (VIBRA -TABS) tablet 100 mg        100 mg Oral Every 12 hours 06/22/24 1400 06/25/24 0959   06/21/24 1600  ceFEPIme  (MAXIPIME ) 2 g in sodium chloride  0.9 % 100 mL IVPB  Status:  Discontinued        2 g 200 mL/hr over 30 Minutes Intravenous Every 8 hours 06/21/24 1507 06/22/24 1400   06/21/24 1000  Ampicillin -Sulbactam (UNASYN ) 3 g in sodium chloride  0.9 % 100 mL IVPB  Status:  Discontinued        3 g 200 mL/hr over 30 Minutes Intravenous Every 6 hours 06/21/24 0822 06/21/24 1507   06/20/24 1000  doxycycline  (VIBRAMYCIN ) 100 mg in sodium chloride  0.9 % 250 mL IVPB  Status:  Discontinued        100 mg 125 mL/hr over 120 Minutes Intravenous Every 12 hours 06/20/24 0840 06/21/24 1507   06/20/24 0900  Ampicillin -Sulbactam (UNASYN ) 3 g in sodium chloride  0.9 % 100  mL IVPB  Status:  Discontinued        3 g 200 mL/hr over 30 Minutes Intravenous Every 6 hours 06/20/24 0840 06/21/24 0822   06/16/24 2000  ceFEPIme  (MAXIPIME ) 2 g in sodium chloride  0.9 % 100 mL IVPB  Status:  Discontinued        2 g 200 mL/hr over 30 Minutes Intravenous Every 12 hours 06/16/24 1408 06/20/24 0840   06/16/24 1500  vancomycin  (VANCOCIN ) IVPB 1000 mg/200 mL premix  Status:  Discontinued        1,000 mg 200 mL/hr over 60 Minutes Intravenous Every 24 hours 06/15/24 1731 06/16/24 1353   06/16/24 1500  vancomycin  (VANCOREADY) IVPB 1250 mg/250 mL  Status:  Discontinued        1,250 mg 166.7 mL/hr over 90 Minutes Intravenous Every 24 hours 06/16/24 1353 06/20/24 0840   06/16/24 1400  ceFEPIme  (MAXIPIME ) 2 g in sodium chloride  0.9 % 100 mL IVPB  Status:  Discontinued        2 g 200 mL/hr over 30 Minutes Intravenous Every 8 hours 06/16/24 1353 06/16/24 1408   06/16/24 0600  ceFEPIme  (MAXIPIME ) 2 g in sodium chloride  0.9 % 100 mL IVPB  Status:  Discontinued  2 g 200 mL/hr over 30 Minutes Intravenous Every 12 hours 06/15/24 1730 06/16/24 1353   06/15/24 1330  vancomycin  (VANCOCIN ) IVPB 1000 mg/200 mL premix        1,000 mg 200 mL/hr over 60 Minutes Intravenous  Once 06/15/24 1329 06/15/24 1601   06/15/24 1330  ceFEPIme  (MAXIPIME ) 2 g in sodium chloride  0.9 % 100 mL IVPB        2 g 200 mL/hr over 30 Minutes Intravenous  Once 06/15/24 1329 06/15/24 1825   06/15/24 1300  cefTRIAXone  (ROCEPHIN ) 1 g in sodium chloride  0.9 % 100 mL IVPB  Status:  Discontinued        1 g 200 mL/hr over 30 Minutes Intravenous  Once 06/15/24 1259 06/15/24 1340        Subjective: Today, patient was seen and examined at bedside.  Patient denies any pain, nausea, vomiting, fever, chills or rigor.  Objective: Vitals:   06/22/24 2122 06/23/24 0438  BP: (!) 159/56 (!) 117/50  Pulse: (!) 58 60  Resp: 18 16  Temp: 98.2 F (36.8 C) 97.8 F (36.6 C)  SpO2: 100% 100%    Intake/Output Summary  (Last 24 hours) at 06/23/2024 1134 Last data filed at 06/23/2024 0900 Gross per 24 hour  Intake 830 ml  Output 150 ml  Net 680 ml   Filed Weights   06/15/24 1221 06/18/24 0829  Weight: 68 kg 68 kg   Body mass index is 20.34 kg/m.   Physical Exam: GENERAL: Patient is alert awake and oriented. Not in obvious distress. HENT: Mild pallor noted.  Pupils equally reactive to light. Oral mucosa is moist NECK: is supple, no gross swelling noted. CHEST: Clear to auscultation. No crackles or wheezes.  Diminished breath sounds bilaterally. CVS: S1 and S2 heard, no murmur. Regular rate and rhythm.  ABDOMEN: Soft, non-tender, bowel sounds are present.  Colostomy noted in the left lower quadrant.  Suprapubic catheter in place EXTREMITIES: Bilateral lower extremities with heel pads CNS: Cranial nerves are intact. No focal motor deficits. SKIN: warm and dry, pressure injury over the buttocks legs  Data Review: I have personally reviewed the following laboratory data and studies,  CBC: Recent Labs  Lab 06/19/24 0458 06/20/24 0601 06/21/24 0447 06/22/24 0555 06/23/24 0625  WBC 11.9* 9.5 11.5* 10.0 9.8  HGB 9.1* 8.7* 8.9* 8.9* 8.9*  HCT 30.4* 29.8* 30.0* 30.2* 30.2*  MCV 82.4 83.7 83.1 81.4 81.0  PLT 394 376 351 327 289   Basic Metabolic Panel: Recent Labs  Lab 06/18/24 0555 06/19/24 0458 06/20/24 0601 06/21/24 0447 06/22/24 0555 06/23/24 0625  NA 136 138 137 141 135 136  K 3.3* 3.0* 2.9* 3.6 3.4* 3.4*  CL 107 109 111 113* 108 107  CO2 18* 18* 17* 19* 20* 22  GLUCOSE 271* 189* 257* 196* 173* 136*  BUN 27* 19 14 11 9 9   CREATININE 0.63 0.56* 0.49* 0.50* 0.44* 0.45*  CALCIUM  8.7* 8.5* 8.4* 8.3* 8.3* 8.5*  MG  --   --   --   --  1.6* 1.7  PHOS 3.4 2.9 2.7  --  1.9* 2.9   Liver Function Tests: Recent Labs  Lab 06/18/24 0555 06/19/24 0458 06/20/24 0601 06/23/24 0625  AST  --   --   --  24  ALT  --   --   --  19  ALKPHOS  --   --   --  107  BILITOT  --   --   --  0.2  PROT   --   --   --  5.5*  ALBUMIN  2.4* 2.2* 2.1* 2.3*   No results for input(s): LIPASE, AMYLASE in the last 168 hours. No results for input(s): AMMONIA in the last 168 hours. Cardiac Enzymes: No results for input(s): CKTOTAL, CKMB, CKMBINDEX, TROPONINI in the last 168 hours. BNP (last 3 results) Recent Labs    07/14/23 1408 07/19/23 0920 07/21/23 0624  BNP 168.3* 136.4* 73.6    ProBNP (last 3 results) No results for input(s): PROBNP in the last 8760 hours.  CBG: Recent Labs  Lab 06/22/24 1214 06/22/24 1645 06/22/24 2122 06/23/24 0721 06/23/24 1106  GLUCAP 203* 146* 141* 134* 160*   Recent Results (from the past 240 hours)  Resp panel by RT-PCR (RSV, Flu A&B, Covid) Anterior Nasal Swab     Status: None   Collection Time: 06/15/24 12:24 PM   Specimen: Anterior Nasal Swab  Result Value Ref Range Status   SARS Coronavirus 2 by RT PCR NEGATIVE NEGATIVE Final    Comment: (NOTE) SARS-CoV-2 target nucleic acids are NOT DETECTED.  The SARS-CoV-2 RNA is generally detectable in upper respiratory specimens during the acute phase of infection. The lowest concentration of SARS-CoV-2 viral copies this assay can detect is 138 copies/mL. A negative result does not preclude SARS-Cov-2 infection and should not be used as the sole basis for treatment or other patient management decisions. A negative result may occur with  improper specimen collection/handling, submission of specimen other than nasopharyngeal swab, presence of viral mutation(s) within the areas targeted by this assay, and inadequate number of viral copies(<138 copies/mL). A negative result must be combined with clinical observations, patient history, and epidemiological information. The expected result is Negative.  Fact Sheet for Patients:  BloggerCourse.com  Fact Sheet for Healthcare Providers:  SeriousBroker.it  This test is no t yet approved or  cleared by the [place]  FDA and  has been authorized for detection and/or diagnosis of SARS-CoV-2 by FDA under an Emergency Use Authorization (EUA). This EUA will remain  in effect (meaning this test can be used) for the duration of the COVID-19 declaration under Section 564(b)(1) of the Act, 21 U.S.C.section 360bbb-3(b)(1), unless the authorization is terminated  or revoked sooner.       Influenza A by PCR NEGATIVE NEGATIVE Final   Influenza B by PCR NEGATIVE NEGATIVE Final    Comment: (NOTE) The Xpert Xpress SARS-CoV-2/FLU/RSV plus assay is intended as an aid in the diagnosis of influenza from Nasopharyngeal swab specimens and should not be used as a sole basis for treatment. Nasal washings and aspirates are unacceptable for Xpert Xpress SARS-CoV-2/FLU/RSV testing.  Fact Sheet for Patients: BloggerCourse.com  Fact Sheet for Healthcare Providers: SeriousBroker.it  This test is not yet approved or cleared by the [place]  FDA and has been authorized for detection and/or diagnosis of SARS-CoV-2 by FDA under an Emergency Use Authorization (EUA). This EUA will remain in effect (meaning this test can be used) for the duration of the COVID-19 declaration under Section 564(b)(1) of the Act, 21 U.S.C. section 360bbb-3(b)(1), unless the authorization is terminated or revoked.     Resp Syncytial Virus by PCR NEGATIVE NEGATIVE Final    Comment: (NOTE) Fact Sheet for Patients: BloggerCourse.com  Fact Sheet for Healthcare Providers: SeriousBroker.it  This test is not yet approved or cleared by the [place]  FDA and has been authorized for detection and/or diagnosis of SARS-CoV-2 by FDA under an Emergency Use Authorization (EUA). This EUA will remain in effect (meaning this test can be used) for the duration of the COVID-19  declaration under Section 564(b)(1) of the Act, 21  U.S.C. section 360bbb-3(b)(1), unless the authorization is terminated or revoked.  Performed at [facility], [address] [place], [place] [address]   Urine Culture     Status: Abnormal   Collection Time: 06/15/24 12:25 PM   Specimen: Urine, Random  Result Value Ref Range Status   Specimen Description   Final    URINE, RANDOM Performed at [facility], [address], [place], [place] [address]    Special Requests   Final    NONE Reflexed from [id number] Performed at [facility], [address] [place], [place] [address]    Culture >=100,000 COLONIES/mL PROTEUS MIRABILIS (A)  Final   Report Status 06/18/2024 FINAL  Final   Organism ID, Bacteria PROTEUS MIRABILIS (A)  Final      Susceptibility   Proteus mirabilis - MIC*    AMPICILLIN  <=2 SENSITIVE Sensitive     CEFAZOLIN  (URINE) Value in next row Sensitive      4 SENSITIVEThis is a modified FDA-approved test that has been validated and its performance characteristics determined by the reporting laboratory.  This laboratory is certified under the Clinical Laboratory Improvement Amendments CLIA as qualified to perform high complexity clinical laboratory testing.    CEFEPIME  Value in next row Sensitive      4 SENSITIVEThis is a modified FDA-approved test that has been validated and its performance characteristics determined by the reporting laboratory.  This laboratory is certified under the Clinical Laboratory Improvement Amendments CLIA as qualified to perform high complexity clinical laboratory testing.    ERTAPENEM Value in next row Sensitive      4 SENSITIVEThis is a modified FDA-approved test that has been validated and its performance characteristics determined by the reporting laboratory.  This laboratory is certified under the Clinical Laboratory Improvement Amendments CLIA as qualified to perform high complexity clinical laboratory testing.    CEFTRIAXONE  Value in next  row Sensitive      4 SENSITIVEThis is a modified FDA-approved test that has been validated and its performance characteristics determined by the reporting laboratory.  This laboratory is certified under the Clinical Laboratory Improvement Amendments CLIA as qualified to perform high complexity clinical laboratory testing.    CIPROFLOXACIN  Value in next row Sensitive      4 SENSITIVEThis is a modified FDA-approved test that has been validated and its performance characteristics determined by the reporting laboratory.  This laboratory is certified under the Clinical Laboratory Improvement Amendments CLIA as qualified to perform high complexity clinical laboratory testing.    GENTAMICIN  Value in next row Sensitive      4 SENSITIVEThis is a modified FDA-approved test that has been validated and its performance characteristics determined by the reporting laboratory.  This laboratory is certified under the Clinical Laboratory Improvement Amendments CLIA as qualified to perform high complexity clinical laboratory testing.    NITROFURANTOIN Value in next row Resistant      4 SENSITIVEThis is a modified FDA-approved test that has been validated and its performance characteristics determined by the reporting laboratory.  This laboratory is certified under the Clinical Laboratory Improvement Amendments CLIA as qualified to perform high complexity clinical laboratory testing.    TRIMETH /SULFA  Value in next row Resistant      4 SENSITIVEThis is a modified FDA-approved test that has been validated and its performance characteristics determined by the reporting laboratory.  This laboratory is certified under the Clinical Laboratory Improvement Amendments CLIA as qualified to perform  high complexity clinical laboratory testing.    AMPICILLIN /SULBACTAM Value in next row Sensitive      4 SENSITIVEThis is a modified FDA-approved test that has been validated and its performance characteristics determined by the reporting  laboratory.  This laboratory is certified under the Clinical Laboratory Improvement Amendments CLIA as qualified to perform high complexity clinical laboratory testing.    PIP/TAZO Value in next row Sensitive      <=4 SENSITIVEThis is a modified FDA-approved test that has been validated and its performance characteristics determined by the reporting laboratory.  This laboratory is certified under the Clinical Laboratory Improvement Amendments CLIA as qualified to perform high complexity clinical laboratory testing.    MEROPENEM  Value in next row Sensitive      <=4 SENSITIVEThis is a modified FDA-approved test that has been validated and its performance characteristics determined by the reporting laboratory.  This laboratory is certified under the Clinical Laboratory Improvement Amendments CLIA as qualified to perform high complexity clinical laboratory testing.    * >=100,000 COLONIES/mL PROTEUS MIRABILIS  Blood Culture (routine x 2)     Status: None   Collection Time: 06/15/24 12:26 PM   Specimen: BLOOD LEFT FOREARM  Result Value Ref Range Status   Specimen Description   Final    BLOOD LEFT FOREARM Performed at [facility], [address], [place], [place] [address]    Special Requests   Final    BOTTLES DRAWN AEROBIC AND ANAEROBIC Blood Culture adequate volume Performed at [facility], [address], [place], [place] [address]    Culture   Final    NO GROWTH 5 DAYS Performed at [facility], [address] [place], [place] [address]    Report Status 06/21/2024 FINAL  Final  Blood Culture (routine x 2)     Status: Abnormal   Collection Time: 06/15/24 12:27 PM   Specimen: BLOOD RIGHT FOREARM  Result Value Ref Range Status   Specimen Description   Final    BLOOD RIGHT FOREARM Performed at [facility], [address] [place], [place] [address]    Special Requests   Final    BOTTLES DRAWN AEROBIC AND ANAEROBIC Blood Culture adequate  volume Performed at [facility], [address], [place], [place] [address]    Culture  Setup Time   Final    GRAM POSITIVE COCCI ANAEROBIC BOTTLE ONLY CRITICAL RESULT CALLED TO, READ BACK BY AND VERIFIED WITH: LEANNE POINDEXTER ON 06/16/24 AT 0629 QSD    Culture (A)  Final    STAPHYLOCOCCUS WARNERI THE SIGNIFICANCE OF ISOLATING THIS ORGANISM FROM A SINGLE SET OF BLOOD CULTURES WHEN MULTIPLE SETS ARE DRAWN IS UNCERTAIN. PLEASE NOTIFY THE MICROBIOLOGY DEPARTMENT WITHIN ONE WEEK IF SPECIATION AND SENSITIVITIES ARE REQUIRED. Performed at [facility], [address] [place], [place] [address]    Report Status 06/18/2024 FINAL  Final  Blood Culture ID Panel (Reflexed)     Status: Abnormal   Collection Time: 06/15/24 12:27 PM  Result Value Ref Range Status   Enterococcus faecalis NOT DETECTED NOT DETECTED Final   Enterococcus Faecium NOT DETECTED NOT DETECTED Final   Listeria monocytogenes NOT DETECTED NOT DETECTED Final   Staphylococcus species DETECTED (A) NOT DETECTED Final    Comment: CRITICAL RESULT CALLED TO, READ BACK BY AND VERIFIED WITH: LEANNE POINDEXTER ON 06/16/24 AT 9370 QSD    Staphylococcus aureus (BCID) NOT DETECTED NOT DETECTED Final   Staphylococcus epidermidis NOT DETECTED NOT  DETECTED Final   Staphylococcus lugdunensis NOT DETECTED NOT DETECTED Final   Streptococcus species NOT DETECTED NOT DETECTED Final   Streptococcus agalactiae NOT DETECTED NOT DETECTED Final   Streptococcus pneumoniae NOT DETECTED NOT DETECTED Final   Streptococcus pyogenes NOT DETECTED NOT DETECTED Final   A.calcoaceticus-baumannii NOT DETECTED NOT DETECTED Final   Bacteroides fragilis NOT DETECTED NOT DETECTED Final   Enterobacterales NOT DETECTED NOT DETECTED Final   Enterobacter cloacae complex NOT DETECTED NOT DETECTED Final   Escherichia coli NOT DETECTED NOT DETECTED Final   Klebsiella aerogenes NOT DETECTED NOT DETECTED Final   Klebsiella oxytoca NOT DETECTED  NOT DETECTED Final   Klebsiella pneumoniae NOT DETECTED NOT DETECTED Final   Proteus species NOT DETECTED NOT DETECTED Final   Salmonella species NOT DETECTED NOT DETECTED Final   Serratia marcescens NOT DETECTED NOT DETECTED Final   Haemophilus influenzae NOT DETECTED NOT DETECTED Final   Neisseria meningitidis NOT DETECTED NOT DETECTED Final   Pseudomonas aeruginosa NOT DETECTED NOT DETECTED Final   Stenotrophomonas maltophilia NOT DETECTED NOT DETECTED Final   Candida albicans NOT DETECTED NOT DETECTED Final   Candida auris NOT DETECTED NOT DETECTED Final   Candida glabrata NOT DETECTED NOT DETECTED Final   Candida krusei NOT DETECTED NOT DETECTED Final   Candida parapsilosis NOT DETECTED NOT DETECTED Final   Candida tropicalis NOT DETECTED NOT DETECTED Final   Cryptococcus neoformans/gattii NOT DETECTED NOT DETECTED Final    Comment: Performed at [facility], [address] [place], [place] [address]  MRSA Next Gen by PCR, Nasal     Status: Abnormal   Collection Time: 06/16/24  4:42 AM   Specimen: Nasal Mucosa; Nasal Swab  Result Value Ref Range Status   MRSA by PCR Next Gen DETECTED (A) NOT DETECTED Final    Comment: Results Called to: [employer], J RN AT 1107 ON 06/16/2024 BY PRUDY, K (NOTE) The GeneXpert MRSA Assay (FDA approved for NASAL specimens only), is one component of a comprehensive MRSA colonization surveillance program. It is not intended to diagnose MRSA infection nor to guide or monitor treatment for MRSA infections. Test performance is not FDA approved in patients less than 38 years old. Performed at [facility], [address], [place], [place] [address]   Aerobic/Anaerobic Culture w Gram Stain (surgical/deep wound)     Status: None   Collection Time: 06/18/24 10:40 AM   Specimen: Wound; Abscess  Result Value Ref Range Status   Specimen Description   Final    WOUND Performed at [facility], [address], [place], [place] [address]    Special Requests   Final    LEFT POSTERIOR THIGH MASS Performed at [facility], [address], [place], [place] [address]    Gram Stain   Final    MODERATE WBC PRESENT, PREDOMINANTLY PMN RARE GRAM POSITIVE COCCI IN CLUSTERS RARE GRAM NEGATIVE RODS    Culture   Final    RARE PROTEUS MIRABILIS RARE ESCHERICHIA COLI FEW PREVOTELLA BUCCAE BETA LACTAMASE POSITIVE Performed at [facility], [address] [place], [place] [address]    Report Status 06/23/2024 FINAL  Final   Organism ID, Bacteria PROTEUS MIRABILIS  Final   Organism ID, Bacteria ESCHERICHIA COLI  Final      Susceptibility   Escherichia coli - MIC*    AMPICILLIN  >=32 RESISTANT Resistant     CEFAZOLIN  (NON-URINE) 4 INTERMEDIATE Intermediate     CEFEPIME  <=0.12 SENSITIVE Sensitive  ERTAPENEM <=0.12 SENSITIVE Sensitive     CEFTRIAXONE  <=0.25 SENSITIVE Sensitive     CIPROFLOXACIN  >=4 RESISTANT Resistant     GENTAMICIN  <=1 SENSITIVE Sensitive     MEROPENEM  <=0.25 SENSITIVE Sensitive     TRIMETH /SULFA  <=20 SENSITIVE Sensitive     AMPICILLIN /SULBACTAM 16 INTERMEDIATE Intermediate     PIP/TAZO Value in next row Sensitive      <=4 SENSITIVEThis is a modified FDA-approved test that has been validated and its performance characteristics determined by the reporting laboratory.  This laboratory is certified under the Clinical Laboratory Improvement Amendments CLIA as qualified to perform high complexity clinical laboratory testing.    * RARE ESCHERICHIA COLI   Proteus mirabilis - MIC*    AMPICILLIN  Value in next row Sensitive      <=4 SENSITIVEThis is a modified FDA-approved test that has been validated and its performance characteristics determined by the reporting laboratory.  This laboratory is certified under the Clinical Laboratory Improvement Amendments CLIA as qualified to perform high complexity clinical laboratory testing.    CEFAZOLIN  (NON-URINE) Value in next  row Intermediate      <=4 SENSITIVEThis is a modified FDA-approved test that has been validated and its performance characteristics determined by the reporting laboratory.  This laboratory is certified under the Clinical Laboratory Improvement Amendments CLIA as qualified to perform high complexity clinical laboratory testing.    CEFEPIME  Value in next row Sensitive      <=4 SENSITIVEThis is a modified FDA-approved test that has been validated and its performance characteristics determined by the reporting laboratory.  This laboratory is certified under the Clinical Laboratory Improvement Amendments CLIA as qualified to perform high complexity clinical laboratory testing.    ERTAPENEM Value in next row Sensitive      <=4 SENSITIVEThis is a modified FDA-approved test that has been validated and its performance characteristics determined by the reporting laboratory.  This laboratory is certified under the Clinical Laboratory Improvement Amendments CLIA as qualified to perform high complexity clinical laboratory testing.    CEFTRIAXONE  Value in next row Sensitive      <=4 SENSITIVEThis is a modified FDA-approved test that has been validated and its performance characteristics determined by the reporting laboratory.  This laboratory is certified under the Clinical Laboratory Improvement Amendments CLIA as qualified to perform high complexity clinical laboratory testing.    CIPROFLOXACIN  Value in next row Sensitive      <=4 SENSITIVEThis is a modified FDA-approved test that has been validated and its performance characteristics determined by the reporting laboratory.  This laboratory is certified under the Clinical Laboratory Improvement Amendments CLIA as qualified to perform high complexity clinical laboratory testing.    GENTAMICIN  Value in next row Sensitive      <=4 SENSITIVEThis is a modified FDA-approved test that has been validated and its performance characteristics determined by the reporting  laboratory.  This laboratory is certified under the Clinical Laboratory Improvement Amendments CLIA as qualified to perform high complexity clinical laboratory testing.    MEROPENEM  Value in next row Sensitive      <=4 SENSITIVEThis is a modified FDA-approved test that has been validated and its performance characteristics determined by the reporting laboratory.  This laboratory is certified under the Clinical Laboratory Improvement Amendments CLIA as qualified to perform high complexity clinical laboratory testing.    TRIMETH /SULFA  Value in next row Resistant      <=4 SENSITIVEThis is a modified FDA-approved test that has been validated and its performance characteristics determined by the reporting laboratory.  This  laboratory is certified under the Clinical Laboratory Improvement Amendments CLIA as qualified to perform high complexity clinical laboratory testing.    AMPICILLIN /SULBACTAM Value in next row Sensitive      <=4 SENSITIVEThis is a modified FDA-approved test that has been validated and its performance characteristics determined by the reporting laboratory.  This laboratory is certified under the Clinical Laboratory Improvement Amendments CLIA as qualified to perform high complexity clinical laboratory testing.    PIP/TAZO Value in next row Sensitive      <=4 SENSITIVEThis is a modified FDA-approved test that has been validated and its performance characteristics determined by the reporting laboratory.  This laboratory is certified under the Clinical Laboratory Improvement Amendments CLIA as qualified to perform high complexity clinical laboratory testing.    * RARE PROTEUS MIRABILIS     Studies: No results found.    Moesha Sarchet, MD  [facility] 06/23/2024  If 7PM-7AM, please contact night-coverage

## 2024-06-24 DIAGNOSIS — A419 Sepsis, unspecified organism: Secondary | ICD-10-CM | POA: Diagnosis not present

## 2024-06-24 DIAGNOSIS — R6520 Severe sepsis without septic shock: Secondary | ICD-10-CM | POA: Diagnosis not present

## 2024-06-24 LAB — BASIC METABOLIC PANEL WITH GFR
Anion gap: 7 (ref 5–15)
BUN: 9 mg/dL (ref 8–23)
CO2: 21 mmol/L — ABNORMAL LOW (ref 22–32)
Calcium: 8.3 mg/dL — ABNORMAL LOW (ref 8.9–10.3)
Chloride: 105 mmol/L (ref 98–111)
Creatinine, Ser: 0.44 mg/dL — ABNORMAL LOW (ref 0.61–1.24)
GFR, Estimated: >60 mL/min (ref >60)
Glucose, Bld: 152 mg/dL — ABNORMAL HIGH (ref 70–99)
Potassium: 3.8 mmol/L (ref 3.5–5.1)
Sodium: 133 mmol/L — ABNORMAL LOW (ref 135–145)

## 2024-06-24 LAB — GLUCOSE, CAPILLARY
Glucose-Capillary: 148 mg/dL — ABNORMAL HIGH (ref 70–99)
Glucose-Capillary: 123 mg/dL — ABNORMAL HIGH (ref 70–99)
Glucose-Capillary: 174 mg/dL — ABNORMAL HIGH (ref 70–99)

## 2024-06-24 MED ORDER — AMOXICILLIN-POT CLAVULANATE 875-125 MG PO TABS: 1.0000 | ORAL_TABLET | Freq: Two times a day (BID) | ORAL | Status: AC

## 2024-06-24 MED ORDER — VITAMIN B-1 100 MG PO TABS: 100.0000 mg | ORAL_TABLET | Freq: Every day | ORAL | Status: AC

## 2024-06-24 MED ORDER — DOXYCYCLINE HYCLATE 100 MG PO TABS: 100.0000 mg | ORAL_TABLET | Freq: Two times a day (BID) | ORAL | Status: AC

## 2024-06-24 MED ORDER — OXYCODONE HCL 5 MG PO TABS: 5.0000 mg | ORAL_TABLET | Freq: Four times a day (QID) | ORAL | 0 refills | Status: DC | PRN

## 2024-06-24 MED ORDER — POTASSIUM CHLORIDE CRYS ER 20 MEQ PO TBCR
40.0000 meq | EXTENDED_RELEASE_TABLET | Freq: Two times a day (BID) | ORAL | Status: DC
Start: 2024-06-24 — End: 2024-06-24
  Administered 2024-06-24: 40 meq via ORAL
  Filled 2024-06-24: qty 2

## 2024-06-24 MED ORDER — NYSTATIN 100000 UNIT/ML MT SUSP: 5.0000 mL | Freq: Four times a day (QID) | OROMUCOSAL | Status: AC

## 2024-06-24 MED ORDER — POLYETHYLENE GLYCOL 3350 17 G PO PACK: 17.0000 g | PACK | Freq: Every day | ORAL | Status: AC | PRN

## 2024-06-24 MED ORDER — GLUCERNA SHAKE PO LIQD: 237.0000 mL | Freq: Three times a day (TID) | ORAL | Status: DC

## 2024-06-24 NOTE — Plan of Care (Signed)
  Problem: Education: Goal: Ability to describe self-care measures that may prevent or decrease complications (Diabetes Survival Skills Education) will improve Outcome: Progressing   Problem: Education: Goal: Individualized Educational Video(s) Outcome: Progressing   Problem: Coping: Goal: Ability to adjust to condition or change in health will improve Outcome: Progressing

## 2024-06-24 NOTE — Progress Notes (Signed)
 Report called to [facility] at [phone]. Report given to Deanglo Leak.  All questions asked and answered.

## 2024-06-24 NOTE — Discharge Summary (Signed)
 Physician Discharge Summary  Brent [name]. FMW:[id number] DOB: [birth year] DOA: 06/15/2024  PCP: Feliciano Devoria LABOR, MD  Admit date: 06/15/2024 Discharge date: 06/24/2024  Admitted From: Skilled nursing facility  Discharge disposition: Skilled nursing facility   Recommendations for Outpatient Follow-Up:   Follow up with your primary care provider at the skilled nursing facility in 3 to 5 days Check CBC, BMP, magnesium  in the next visit   Discharge Diagnosis:   Principal Problem:   Severe sepsis secondary to acute OM of left ischium and posterior thigh abscess Active Problems:   AKI (acute kidney injury)   DM type 2 (diabetes mellitus, type 2) (HCC)   (HFpEF) heart failure with preserved ejection fraction (HCC)   Essential hypertension   Coronary artery disease involving native coronary artery of native heart without angina pectoris   Normocytic anemia   Neurogenic bladder   Hyperlipidemia   GERD (gastroesophageal reflux disease)   Colostomy present (HCC)   Chronic osteomyelitis of pelvic region, right (HCC)   Obstructive sleep apnea   Thigh abscess   Acute osteomyelitis involving pelvic region and thigh, left (HCC)   Wound infection   Discharge Condition: Improved.  Diet recommendation: Low sodium, heart healthy.  Carbohydrate-modified.    Wound care: See below  Code status: Full.   History of Present Illness:   Patient is a 78 years old male with PMH significant for CAD status post DES, diabetes type 2, hypertension, hyperlipidemia, history of melanoma, OSA, transverse myelitis, stage IV sacral decubitus ulcer, history of chronic OM, colostomy and suprapubic catheter presented to the ED with concerns of worsening decubitus ulcer and fatigue.Brent [name] He has had stage IV ulcers for 4-years, wound appears to be getting worse, patient was also reported to be confused at the rehab facility with decreased oral intake and nausea for few weeks. In the ED, CT scan of the  abdomen and pelvis showed large decubitus ulcer overlying the sacrum and bilateral ischial tuberosity. Finding compatible with acute osteomyelitis within the left ischium and chronic osteomyelitis within the right ischium. New 4 x 6 cm complex fluid collection within the subcutaneous tissue posterior to the left thigh, immediately inferior to the left ischial decubitus ulcer consistent with abscess. General surgery was consulted and patient underwent wound exploration with incision and drainage and open packing of the left posterior thigh abscess on 9/30    Hospital Course:   Following conditions were addressed during hospitalization as listed below,  Severe sepsis secondary to Acute osteomyelitis of the left ischium and posterior thigh abscess: Staph warneri + bacteremia-likely to be contaminant Stage IV right and left lower mid upper decubitus ulcer and left thigh pressure posterior abscess: Continue IV antibiotics.  Status post wound exploration with incision and drainage and open packing active left posterior thigh abscess by surgery on 9/30.  Plan is to transition to oral Doxycycline  and Augmentin  to complete a total of 7-day   Acute kidney injury: > Resolved. Initial creatinine of 1.4.  Creatinine today at 0.4   Hypokalemia:  Improved after replacement.  Latest potassium of 3.8   Proteus UTI: Urine culture growing 100,000 colonies of Proteus mirabilis.  Patient was initially on cefepime .  Patient received doxycycline  and Augmentin  and will continue to complete course..   ' Diabetes type 2 with Hyperglycemia: Improved.  Last Hb A1c 7.1.  Received sliding scale and Semglee .  Latest POC glucose of 1 23.  Will resume metformin  and diabetic diet on discharge.   Chronic diastolic heart failure with preserved ejection  fraction: Resume Lasix  on discharge per   Essential hypertension: Will resume antihypertensives on discharge.  Discontinue midodrine  while in the hospital   Coronary artery  disease involving native coronary artery of native heart without angina pectoris: No acute issues.  Continue Lipitor.   Acute on chronic normocytic anemia Baseline hemoglobin 9-10 Latest hemoglobin of 8.9.  Received 1 unit of packed RBC during hospitalization.   Neurogenic bladder: Status post suprapubic catheter, continue foley care.   Hyperlipidemia: Continue Lipitor   GERD (gastroesophageal reflux disease): Continue pepcid  BID.   Colostomy present Colostomy care .   Chronic osteomyelitis of pelvic region, right Seen by ID in April of 2025    Obstructive sleep apnea: Not on CPAP    FTT; poor oral intake . Continue Glucerna    Delirium with acute metabolic encephalopathy Improved.  Likely secondary to infection   Deconditioning debility.  Plan for skilled nursing facility on discharge   Wound 06/15/24 2300 Pressure Injury Buttocks Right;Left;Lower;Mid;Upper Stage 4 - Full thickness tissue loss with exposed bone, tendon or muscle. (Active)     Wound 06/15/24 2300 Pressure Injury Thigh Left;Posterior;Proximal (Active)     Disposition.  At this time, patient is stable for disposition to skilled nursing facility.  Medical Consultants:   General Surgery Infectious disease  Procedures:    Incision and drainage  Subjective:   Today, patient was seen and examined at bedside.  Inquiring about getting discharged.  Denies any nausea vomiting fever chills or rigor.  Discharge Exam:   Vitals:   06/24/24 0942 06/24/24 1259  BP: 121/69 (!) 160/67  Pulse:  79  Resp:    Temp:    SpO2:     Vitals:   06/23/24 2138 06/24/24 0440 06/24/24 0942 06/24/24 1259  BP: (!) 122/57 (!) 124/59 121/69 (!) 160/67  Pulse: 70 68  79  Resp: 16 18    Temp: 98 F (36.7 C) 97.8 F (36.6 C)    TempSrc:      SpO2: 98% 99%    Weight:      Height:        GENERAL: Patient is alert awake and oriented. Not in obvious distress. HENT: Mild pallor noted.  Pupils equally reactive to  light. Oral mucosa is moist NECK: is supple, no gross swelling noted. CHEST: Clear to auscultation. No crackles or wheezes.  Diminished breath sounds bilaterally. CVS: S1 and S2 heard, no murmur. Regular rate and rhythm.  ABDOMEN: Soft, non-tender, bowel sounds are present.  Colostomy noted in the left lower quadrant.  Suprapubic catheter in place EXTREMITIES: Bilateral lower extremities with heel pads CNS: Cranial nerves are intact. No focal motor deficits. SKIN: warm and dry, pressure injury over the buttocks, leg  The results of significant diagnostics from this hospitalization (including imaging, microbiology, ancillary and laboratory) are listed below for reference.     Diagnostic Studies:   CT ABDOMEN PELVIS W CONTRAST Result Date: 06/15/2024 CLINICAL DATA:  Decubitus ulcer EXAM: CT ABDOMEN AND PELVIS WITH CONTRAST TECHNIQUE: Multidetector CT imaging of the abdomen and pelvis was performed using the standard protocol following bolus administration of intravenous contrast. RADIATION DOSE REDUCTION: This exam was performed according to the departmental dose-optimization program which includes automated exposure control, adjustment of the mA and/or kV according to patient size and/or use of iterative reconstruction technique. CONTRAST:  OMNIPAQUE  IOHEXOL  300 MG/ML  SOLN COMPARISON:  12/01/2023 common 07/30/2023 FINDINGS: Lower chest: Bibasilar scarring and fibrosis. No acute pleural or parenchymal lung disease. Hepatobiliary: Hepatic steatosis. No  focal liver abnormality. Prior cholecystectomy. Pancreas: Unremarkable. No pancreatic ductal dilatation or surrounding inflammatory changes. Spleen: Borderline splenomegaly, measuring 12.8 x 7.1 x 11.8 cm. No focal parenchymal abnormality. Adrenals/Urinary Tract: The adrenals are unremarkable. Coarse parenchymal calcifications are identified within the upper pole left kidney. There also calcifications dependent within a peripelvic upper pole left  renal cyst. Other simple cysts are seen within the bilateral kidneys. No specific imaging follow-up is recommended. No obstructive uropathy within either kidney. The bladder is decompressed with a suprapubic catheter, limiting its evaluation. Stomach/Bowel: No bowel obstruction or ileus. There is a loop colostomy within the left lower quadrant. Normal appendix right lower quadrant. Distal colonic diverticulosis without diverticulitis. There is a large amount of retained stool within the rectal wall, unchanged since prior exam. Vascular/Lymphatic: Aortic atherosclerosis. No enlarged abdominal or pelvic lymph nodes. Reproductive: Prostate is unremarkable. Other: No free fluid or free intraperitoneal gas. No abdominal wall hernia. Musculoskeletal: Large decubitus ulcers are again seen overlying the bilateral ischial tuberosities. Cortical destruction on the left consistent with osteomyelitis is identified on prior MRI. Stable sclerotic changes of the right ischium likely reflect chronic osteomyelitis. On the left, new since prior studies, there is a 4.0 x 6.0 cm complex fluid collection within the posterior upper thigh just inferior to the decubitus ulcer, consistent with abscess. There is also a large sacral decubitus ulcer, though I do not see any bony destruction along the sacrum to suggest osteomyelitis at this time. Reconstructed images demonstrate no additional findings. IMPRESSION: 1. Large decubitus ulcers overlying the sacrum and bilateral ischial tuberosities as above. Findings compatible with acute osteomyelitis within the left ischium, and chronic osteomyelitis within the right ischium. 2. New 4 x 6 cm complex fluid collection within the subcutaneous tissues posterior left thigh, immediately inferior to the left ischial decubitus ulcer, consistent with abscess. 3. Hepatic steatosis. 4. Borderline splenomegaly. 5. Distal colonic diverticulosis without diverticulitis. Stable loop colostomy left lower  quadrant. 6. Stable indwelling suprapubic catheter. Electronically Signed   By: Ozell Daring M.D.   On: 06/15/2024 14:37   DG Chest Port 1 View Result Date: 06/15/2024 CLINICAL DATA:  Sacral and bilateral leg wounds. Clinical concern for sepsis. EXAM: PORTABLE CHEST 1 VIEW COMPARISON:  08/06/2023.  Chest CTA dated 07/19/2023. FINDINGS: Stable borderline enlarged cardiac silhouette. Clear lungs with normal vascularity. Diffuse osteopenia. IMPRESSION: No acute abnormality. Electronically Signed   By: Elspeth Bathe M.D.   On: 06/15/2024 12:06     Labs:   Basic Metabolic Panel: Recent Labs  Lab 06/18/24 0555 06/19/24 0458 06/20/24 0601 06/21/24 0447 06/22/24 0555 06/23/24 0625 06/24/24 0921  NA 136 138 137 141 135 136 133*  K 3.3* 3.0* 2.9* 3.6 3.4* 3.4* 3.8  CL 107 109 111 113* 108 107 105  CO2 18* 18* 17* 19* 20* 22 21*  GLUCOSE 271* 189* 257* 196* 173* 136* 152*  BUN 27* 19 14 11 9 9 9   CREATININE 0.63 0.56* 0.49* 0.50* 0.44* 0.45* 0.44*  CALCIUM  8.7* 8.5* 8.4* 8.3* 8.3* 8.5* 8.3*  MG  --   --   --   --  1.6* 1.7  --   PHOS 3.4 2.9 2.7  --  1.9* 2.9  --    GFR Estimated Creatinine Clearance: 72 mL/min (A) (by C-G formula based on SCr of 0.44 mg/dL (L)). Liver Function Tests: Recent Labs  Lab 06/18/24 0555 06/19/24 0458 06/20/24 0601 06/23/24 0625  AST  --   --   --  24  ALT  --   --   --  19  ALKPHOS  --   --   --  107  BILITOT  --   --   --  0.2  PROT  --   --   --  5.5*  ALBUMIN  2.4* 2.2* 2.1* 2.3*   No results for input(s): LIPASE, AMYLASE in the last 168 hours. No results for input(s): AMMONIA in the last 168 hours. Coagulation profile No results for input(s): INR, PROTIME in the last 168 hours.  CBC: Recent Labs  Lab 06/19/24 0458 06/20/24 0601 06/21/24 0447 06/22/24 0555 06/23/24 0625  WBC 11.9* 9.5 11.5* 10.0 9.8  HGB 9.1* 8.7* 8.9* 8.9* 8.9*  HCT 30.4* 29.8* 30.0* 30.2* 30.2*  MCV 82.4 83.7 83.1 81.4 81.0  PLT 394 376 351 327 289    Cardiac Enzymes: No results for input(s): CKTOTAL, CKMB, CKMBINDEX, TROPONINI in the last 168 hours. BNP: Invalid input(s): POCBNP CBG: Recent Labs  Lab 06/23/24 1106 06/23/24 1602 06/23/24 2138 06/24/24 0712 06/24/24 1109  GLUCAP 160* 136* 150* 148* 123*   D-Dimer No results for input(s): DDIMER in the last 72 hours. Hgb A1c No results for input(s): HGBA1C in the last 72 hours. Lipid Profile No results for input(s): CHOL, HDL, LDLCALC, TRIG, CHOLHDL, LDLDIRECT in the last 72 hours. Thyroid  function studies No results for input(s): TSH, T4TOTAL, T3FREE, THYROIDAB in the last 72 hours.  Invalid input(s): FREET3 Anemia work up No results for input(s): VITAMINB12, FOLATE, FERRITIN, TIBC, IRON , RETICCTPCT in the last 72 hours. Microbiology Recent Results (from the past 240 hours)  Resp panel by RT-PCR (RSV, Flu A&B, Covid) Anterior Nasal Swab     Status: None   Collection Time: 06/15/24 12:24 PM   Specimen: Anterior Nasal Swab  Result Value Ref Range Status   SARS Coronavirus 2 by RT PCR NEGATIVE NEGATIVE Final    Comment: (NOTE) SARS-CoV-2 target nucleic acids are NOT DETECTED.  The SARS-CoV-2 RNA is generally detectable in upper respiratory specimens during the acute phase of infection. The lowest concentration of SARS-CoV-2 viral copies this assay can detect is 138 copies/mL. A negative result does not preclude SARS-Cov-2 infection and should not be used as the sole basis for treatment or other patient management decisions. A negative result may occur with  improper specimen collection/handling, submission of specimen other than nasopharyngeal swab, presence of viral mutation(s) within the areas targeted by this assay, and inadequate number of viral copies(<138 copies/mL). A negative result must be combined with clinical observations, patient history, and epidemiological information. The expected result is  Negative.  Fact Sheet for Patients:  BloggerCourse.com  Fact Sheet for Healthcare Providers:  SeriousBroker.it  This test is no t yet approved or cleared by the [place]  FDA and  has been authorized for detection and/or diagnosis of SARS-CoV-2 by FDA under an Emergency Use Authorization (EUA). This EUA will remain  in effect (meaning this test can be used) for the duration of the COVID-19 declaration under Section 564(b)(1) of the Act, 21 U.S.C.section 360bbb-3(b)(1), unless the authorization is terminated  or revoked sooner.       Influenza A by PCR NEGATIVE NEGATIVE Final   Influenza B by PCR NEGATIVE NEGATIVE Final    Comment: (NOTE) The Xpert Xpress SARS-CoV-2/FLU/RSV plus assay is intended as an aid in the diagnosis of influenza from Nasopharyngeal swab specimens and should not be used as a sole basis for treatment. Nasal washings and aspirates are unacceptable for Xpert Xpress SARS-CoV-2/FLU/RSV testing.  Fact Sheet for Patients: BloggerCourse.com  Fact Sheet for Healthcare Providers:  SeriousBroker.it  This test is not yet approved or cleared by the [place]  FDA and has been authorized for detection and/or diagnosis of SARS-CoV-2 by FDA under an Emergency Use Authorization (EUA). This EUA will remain in effect (meaning this test can be used) for the duration of the COVID-19 declaration under Section 564(b)(1) of the Act, 21 U.S.C. section 360bbb-3(b)(1), unless the authorization is terminated or revoked.     Resp Syncytial Virus by PCR NEGATIVE NEGATIVE Final    Comment: (NOTE) Fact Sheet for Patients: BloggerCourse.com  Fact Sheet for Healthcare Providers: SeriousBroker.it  This test is not yet approved or cleared by the [place]  FDA and has been authorized for detection and/or diagnosis of  SARS-CoV-2 by FDA under an Emergency Use Authorization (EUA). This EUA will remain in effect (meaning this test can be used) for the duration of the COVID-19 declaration under Section 564(b)(1) of the Act, 21 U.S.C. section 360bbb-3(b)(1), unless the authorization is terminated or revoked.  Performed at [facility], [address], [place], [place] [address]   Urine Culture     Status: Abnormal   Collection Time: 06/15/24 12:25 PM   Specimen: Urine, Random  Result Value Ref Range Status   Specimen Description   Final    URINE, RANDOM Performed at [facility], [address], [place], [place] [address]    Special Requests   Final    NONE Reflexed from [id number] Performed at [facility], [address], [place], [place] [address]    Culture >=100,000 COLONIES/mL PROTEUS MIRABILIS (A)  Final   Report Status 06/18/2024 FINAL  Final   Organism ID, Bacteria PROTEUS MIRABILIS (A)  Final      Susceptibility   Proteus mirabilis - MIC*    AMPICILLIN  <=2 SENSITIVE Sensitive     CEFAZOLIN  (URINE) Value in next row Sensitive      4 SENSITIVEThis is a modified FDA-approved test that has been validated and its performance characteristics determined by the reporting laboratory.  This laboratory is certified under the Clinical Laboratory Improvement Amendments CLIA as qualified to perform high complexity clinical laboratory testing.    CEFEPIME  Value in next row Sensitive      4 SENSITIVEThis is a modified FDA-approved test that has been validated and its performance characteristics determined by the reporting laboratory.  This laboratory is certified under the Clinical Laboratory Improvement Amendments CLIA as qualified to perform high complexity clinical laboratory testing.    ERTAPENEM Value in next row Sensitive      4 SENSITIVEThis is a modified FDA-approved test that has been validated and its performance characteristics determined by the  reporting laboratory.  This laboratory is certified under the Clinical Laboratory Improvement Amendments CLIA as qualified to perform high complexity clinical laboratory testing.    CEFTRIAXONE  Value in next row Sensitive      4 SENSITIVEThis is a modified FDA-approved test that has been validated and its performance characteristics determined by the reporting laboratory.  This laboratory is certified under the Clinical Laboratory Improvement Amendments CLIA as qualified to perform high complexity clinical laboratory testing.    CIPROFLOXACIN  Value in next row Sensitive      4 SENSITIVEThis is a modified FDA-approved test that has been validated and its performance characteristics determined by the reporting laboratory.  This laboratory is certified under the Clinical Laboratory Improvement Amendments CLIA as qualified to perform high complexity clinical laboratory testing.    GENTAMICIN  Value in next row Sensitive  4 SENSITIVEThis is a modified FDA-approved test that has been validated and its performance characteristics determined by the reporting laboratory.  This laboratory is certified under the Clinical Laboratory Improvement Amendments CLIA as qualified to perform high complexity clinical laboratory testing.    NITROFURANTOIN Value in next row Resistant      4 SENSITIVEThis is a modified FDA-approved test that has been validated and its performance characteristics determined by the reporting laboratory.  This laboratory is certified under the Clinical Laboratory Improvement Amendments CLIA as qualified to perform high complexity clinical laboratory testing.    TRIMETH /SULFA  Value in next row Resistant      4 SENSITIVEThis is a modified FDA-approved test that has been validated and its performance characteristics determined by the reporting laboratory.  This laboratory is certified under the Clinical Laboratory Improvement Amendments CLIA as qualified to perform high complexity clinical  laboratory testing.    AMPICILLIN /SULBACTAM Value in next row Sensitive      4 SENSITIVEThis is a modified FDA-approved test that has been validated and its performance characteristics determined by the reporting laboratory.  This laboratory is certified under the Clinical Laboratory Improvement Amendments CLIA as qualified to perform high complexity clinical laboratory testing.    PIP/TAZO Value in next row Sensitive      <=4 SENSITIVEThis is a modified FDA-approved test that has been validated and its performance characteristics determined by the reporting laboratory.  This laboratory is certified under the Clinical Laboratory Improvement Amendments CLIA as qualified to perform high complexity clinical laboratory testing.    MEROPENEM  Value in next row Sensitive      <=4 SENSITIVEThis is a modified FDA-approved test that has been validated and its performance characteristics determined by the reporting laboratory.  This laboratory is certified under the Clinical Laboratory Improvement Amendments CLIA as qualified to perform high complexity clinical laboratory testing.    * >=100,000 COLONIES/mL PROTEUS MIRABILIS  Blood Culture (routine x 2)     Status: None   Collection Time: 06/15/24 12:26 PM   Specimen: BLOOD LEFT FOREARM  Result Value Ref Range Status   Specimen Description   Final    BLOOD LEFT FOREARM Performed at [facility], [address] [place], [place] [address]    Special Requests   Final    BOTTLES DRAWN AEROBIC AND ANAEROBIC Blood Culture adequate volume Performed at [facility], [address], [place], [place] [address]    Culture   Final    NO GROWTH 5 DAYS Performed at [facility], [address], [place], [place] [address]    Report Status 06/21/2024 FINAL  Final  Blood Culture (routine x 2)     Status: Abnormal   Collection Time: 06/15/24 12:27 PM   Specimen: BLOOD RIGHT FOREARM  Result Value Ref Range Status   Specimen Description    Final    BLOOD RIGHT FOREARM Performed at [facility], [address], [place], [place] [address]    Special Requests   Final    BOTTLES DRAWN AEROBIC AND ANAEROBIC Blood Culture adequate volume Performed at [facility], [address], [place], [place] [address]    Culture  Setup Time   Final    GRAM POSITIVE COCCI ANAEROBIC BOTTLE ONLY CRITICAL RESULT CALLED TO, READ BACK BY AND VERIFIED WITH: LEANNE POINDEXTER ON 06/16/24 AT 9370 QSD    Culture (A)  Final    STAPHYLOCOCCUS WARNERI THE SIGNIFICANCE OF ISOLATING THIS ORGANISM FROM A SINGLE SET  OF BLOOD CULTURES WHEN MULTIPLE SETS ARE DRAWN IS UNCERTAIN. PLEASE NOTIFY THE MICROBIOLOGY DEPARTMENT WITHIN ONE WEEK IF SPECIATION AND SENSITIVITIES ARE REQUIRED. Performed at [facility], [address] [place], [place] [address]    Report Status 06/18/2024 FINAL  Final  Blood Culture ID Panel (Reflexed)     Status: Abnormal   Collection Time: 06/15/24 12:27 PM  Result Value Ref Range Status   Enterococcus faecalis NOT DETECTED NOT DETECTED Final   Enterococcus Faecium NOT DETECTED NOT DETECTED Final   Listeria monocytogenes NOT DETECTED NOT DETECTED Final   Staphylococcus species DETECTED (A) NOT DETECTED Final    Comment: CRITICAL RESULT CALLED TO, READ BACK BY AND VERIFIED WITH: LEANNE POINDEXTER ON 06/16/24 AT 9370 QSD    Staphylococcus aureus (BCID) NOT DETECTED NOT DETECTED Final   Staphylococcus epidermidis NOT DETECTED NOT DETECTED Final   Staphylococcus lugdunensis NOT DETECTED NOT DETECTED Final   Streptococcus species NOT DETECTED NOT DETECTED Final   Streptococcus agalactiae NOT DETECTED NOT DETECTED Final   Streptococcus pneumoniae NOT DETECTED NOT DETECTED Final   Streptococcus pyogenes NOT DETECTED NOT DETECTED Final   A.calcoaceticus-baumannii NOT DETECTED NOT DETECTED Final   Bacteroides fragilis NOT DETECTED NOT DETECTED Final   Enterobacterales NOT DETECTED NOT DETECTED Final    Enterobacter cloacae complex NOT DETECTED NOT DETECTED Final   Escherichia coli NOT DETECTED NOT DETECTED Final   Klebsiella aerogenes NOT DETECTED NOT DETECTED Final   Klebsiella oxytoca NOT DETECTED NOT DETECTED Final   Klebsiella pneumoniae NOT DETECTED NOT DETECTED Final   Proteus species NOT DETECTED NOT DETECTED Final   Salmonella species NOT DETECTED NOT DETECTED Final   Serratia marcescens NOT DETECTED NOT DETECTED Final   Haemophilus influenzae NOT DETECTED NOT DETECTED Final   Neisseria meningitidis NOT DETECTED NOT DETECTED Final   Pseudomonas aeruginosa NOT DETECTED NOT DETECTED Final   Stenotrophomonas maltophilia NOT DETECTED NOT DETECTED Final   Candida albicans NOT DETECTED NOT DETECTED Final   Candida auris NOT DETECTED NOT DETECTED Final   Candida glabrata NOT DETECTED NOT DETECTED Final   Candida krusei NOT DETECTED NOT DETECTED Final   Candida parapsilosis NOT DETECTED NOT DETECTED Final   Candida tropicalis NOT DETECTED NOT DETECTED Final   Cryptococcus neoformans/gattii NOT DETECTED NOT DETECTED Final    Comment: Performed at [facility], [address] [place], [place] [address]  MRSA Next Gen by PCR, Nasal     Status: Abnormal   Collection Time: 06/16/24  4:42 AM   Specimen: Nasal Mucosa; Nasal Swab  Result Value Ref Range Status   MRSA by PCR Next Gen DETECTED (A) NOT DETECTED Final    Comment: Results Called to: [employer], J RN AT 1107 ON 06/16/2024 BY PRUDY, K (NOTE) The GeneXpert MRSA Assay (FDA approved for NASAL specimens only), is one component of a comprehensive MRSA colonization surveillance program. It is not intended to diagnose MRSA infection nor to guide or monitor treatment for MRSA infections. Test performance is not FDA approved in patients less than 20 years old. Performed at [facility], [address] [place], [place] [address]   Aerobic/Anaerobic Culture w Gram Stain (surgical/deep wound)     Status: None    Collection Time: 06/18/24 10:40 AM   Specimen: Wound; Abscess  Result Value Ref Range Status   Specimen Description   Final    WOUND Performed at [facility], [address], [place], [place] [address]    Special Requests   Final  LEFT POSTERIOR THIGH MASS Performed at [facility], [address], [place], [place] [address]    Gram Stain   Final    MODERATE WBC PRESENT, PREDOMINANTLY PMN RARE GRAM POSITIVE COCCI IN CLUSTERS RARE GRAM NEGATIVE RODS    Culture   Final    RARE PROTEUS MIRABILIS RARE ESCHERICHIA COLI FEW PREVOTELLA BUCCAE BETA LACTAMASE POSITIVE Performed at [facility], [address] [place], [place] [address]    Report Status 06/23/2024 FINAL  Final   Organism ID, Bacteria PROTEUS MIRABILIS  Final   Organism ID, Bacteria ESCHERICHIA COLI  Final      Susceptibility   Escherichia coli - MIC*    AMPICILLIN  >=32 RESISTANT Resistant     CEFAZOLIN  (NON-URINE) 4 INTERMEDIATE Intermediate     CEFEPIME  <=0.12 SENSITIVE Sensitive     ERTAPENEM <=0.12 SENSITIVE Sensitive     CEFTRIAXONE  <=0.25 SENSITIVE Sensitive     CIPROFLOXACIN  >=4 RESISTANT Resistant     GENTAMICIN  <=1 SENSITIVE Sensitive     MEROPENEM  <=0.25 SENSITIVE Sensitive     TRIMETH /SULFA  <=20 SENSITIVE Sensitive     AMPICILLIN /SULBACTAM 16 INTERMEDIATE Intermediate     PIP/TAZO Value in next row Sensitive      <=4 SENSITIVEThis is a modified FDA-approved test that has been validated and its performance characteristics determined by the reporting laboratory.  This laboratory is certified under the Clinical Laboratory Improvement Amendments CLIA as qualified to perform high complexity clinical laboratory testing.    * RARE ESCHERICHIA COLI   Proteus mirabilis - MIC*    AMPICILLIN  Value in next row Sensitive      <=4 SENSITIVEThis is a modified FDA-approved test that has been validated and its performance characteristics determined by the reporting  laboratory.  This laboratory is certified under the Clinical Laboratory Improvement Amendments CLIA as qualified to perform high complexity clinical laboratory testing.    CEFAZOLIN  (NON-URINE) Value in next row Intermediate      <=4 SENSITIVEThis is a modified FDA-approved test that has been validated and its performance characteristics determined by the reporting laboratory.  This laboratory is certified under the Clinical Laboratory Improvement Amendments CLIA as qualified to perform high complexity clinical laboratory testing.    CEFEPIME  Value in next row Sensitive      <=4 SENSITIVEThis is a modified FDA-approved test that has been validated and its performance characteristics determined by the reporting laboratory.  This laboratory is certified under the Clinical Laboratory Improvement Amendments CLIA as qualified to perform high complexity clinical laboratory testing.    ERTAPENEM Value in next row Sensitive      <=4 SENSITIVEThis is a modified FDA-approved test that has been validated and its performance characteristics determined by the reporting laboratory.  This laboratory is certified under the Clinical Laboratory Improvement Amendments CLIA as qualified to perform high complexity clinical laboratory testing.    CEFTRIAXONE  Value in next row Sensitive      <=4 SENSITIVEThis is a modified FDA-approved test that has been validated and its performance characteristics determined by the reporting laboratory.  This laboratory is certified under the Clinical Laboratory Improvement Amendments CLIA as qualified to perform high complexity clinical laboratory testing.    CIPROFLOXACIN  Value in next row Sensitive      <=4 SENSITIVEThis is a modified FDA-approved test that has been validated and its performance characteristics determined by the reporting laboratory.  This laboratory is certified under the Clinical Laboratory Improvement Amendments CLIA as qualified to perform high complexity clinical  laboratory testing.  GENTAMICIN  Value in next row Sensitive      <=4 SENSITIVEThis is a modified FDA-approved test that has been validated and its performance characteristics determined by the reporting laboratory.  This laboratory is certified under the Clinical Laboratory Improvement Amendments CLIA as qualified to perform high complexity clinical laboratory testing.    MEROPENEM  Value in next row Sensitive      <=4 SENSITIVEThis is a modified FDA-approved test that has been validated and its performance characteristics determined by the reporting laboratory.  This laboratory is certified under the Clinical Laboratory Improvement Amendments CLIA as qualified to perform high complexity clinical laboratory testing.    TRIMETH /SULFA  Value in next row Resistant      <=4 SENSITIVEThis is a modified FDA-approved test that has been validated and its performance characteristics determined by the reporting laboratory.  This laboratory is certified under the Clinical Laboratory Improvement Amendments CLIA as qualified to perform high complexity clinical laboratory testing.    AMPICILLIN /SULBACTAM Value in next row Sensitive      <=4 SENSITIVEThis is a modified FDA-approved test that has been validated and its performance characteristics determined by the reporting laboratory.  This laboratory is certified under the Clinical Laboratory Improvement Amendments CLIA as qualified to perform high complexity clinical laboratory testing.    PIP/TAZO Value in next row Sensitive      <=4 SENSITIVEThis is a modified FDA-approved test that has been validated and its performance characteristics determined by the reporting laboratory.  This laboratory is certified under the Clinical Laboratory Improvement Amendments CLIA as qualified to perform high complexity clinical laboratory testing.    * RARE PROTEUS MIRABILIS     Discharge Instructions:   Discharge Instructions     Call MD for:  severe uncontrolled pain    Complete by: As directed    Call MD for:  temperature >100.4   Complete by: As directed    Diet - low sodium heart healthy   Complete by: As directed    Diet Carb Modified   Complete by: As directed    Discharge instructions   Complete by: As directed    Follow-up with your primary care provider at the skilled nursing facility in 3 to 5 days check blood work at that time.  Continue wound care dressing.  Complete course of antibiotic.  Seek medical attention for worsening symptoms.   Discharge wound care:   Complete by: As directed    Wound care  Every shift      Comments: Left thigh wound - Please change wet-to-dry dressing BID or when soiled.  Wound care  Every other day    Comments: Left and right shin: Cleanse LE wounds with saline, pat dry the peri-wound skin. Cover with single layer of xeroform daily, top with silicone foam dressing. The foam can stay up to 3 days if is not saturated, it is ok to lift the foam and reapply Xeroform.   Wound care  Daily      Comments: Cleanse ischial wounds and sacral wound with Vashe Soila [phone]), pat dry. Pack with Dakins 1/4% moist gauze, top with ABD pads, secure with tape. Change BID   Increase activity slowly   Complete by: As directed       Allergies as of 06/24/2024       Reactions   Cipro  [ciprofloxacin  Hcl] Other (See Comments)   Hallucinations   Neurontin  [gabapentin ] Other (See Comments)   Hallucinations   Levaquin  [levofloxacin ] Rash   Penicillins Hives, Rash, Other (See Comments)  Tolerated Unasyn , Augmentin , cefepime  and cephalexin         Medication List     STOP taking these medications    Hycodan 5-1.5 MG/5ML syrup Generic drug: HYDROcodone  bit-homatropine   potassium chloride  SA 20 MEQ tablet Commonly known as: KLOR-CON  M       TAKE these medications    acetaminophen  500 MG tablet Commonly known as: TYLENOL  Take 2 tablets (1,000 mg total) by mouth every 6 (six) hours as needed for mild pain (or Fever  >/= 101).   albuterol  1.25 MG/3ML nebulizer solution Commonly known as: ACCUNEB  Take 1 ampule by nebulization 2 (two) times daily as needed (for coughing).   amoxicillin -clavulanate 875-125 MG tablet Commonly known as: AUGMENTIN  Take 1 tablet by mouth every 12 (twelve) hours for 2 days.   Artificial Tears 1 % ophthalmic solution Generic drug: carboxymethylcellulose Place 1 drop into both eyes in the morning.   ascorbic acid  500 MG tablet Commonly known as: VITAMIN C  Take 500 mg by mouth daily.   aspirin -acetaminophen -caffeine 250-250-65 MG tablet Commonly known as: EXCEDRIN MIGRAINE Take 2 tablets by mouth every 12 (twelve) hours as needed (for headaches).   atenolol  25 MG tablet Commonly known as: TENORMIN  Take 12.5 mg by mouth in the morning.   atorvastatin  40 MG tablet Commonly known as: LIPITOR Take 1 tablet (40 mg total) by mouth at bedtime.   azelastine 0.1 % nasal spray Commonly known as: ASTELIN Place 2 sprays into both nostrils in the morning.   Baclofen  5 MG Tabs Take 1 tablet (5 mg total) by mouth 3 (three) times daily.   Biofreeze 4 % Gel Generic drug: Menthol  (Topical Analgesic) Apply 1 application  topically 2 (two) times daily as needed (pain to back (upper and lower),arms and shoulders).   cholecalciferol  25 MCG (1000 UNIT) tablet Commonly known as: VITAMIN D3 Take 2,000 Units by mouth daily.   ciclopirox 8 % solution Commonly known as: PENLAC Apply 1 application  topically See admin instructions. Apply over affected nails and surrounding skin. Apply daily over previous coat. After seven (7) days, may remove with alcohol  and continue cycle.   doxycycline  100 MG tablet Commonly known as: VIBRA -TABS Take 1 tablet (100 mg total) by mouth every 12 (twelve) hours for 2 days.   DULoxetine  30 MG capsule Commonly known as: CYMBALTA  Take 1 capsule (30 mg total) by mouth at bedtime.   famotidine  20 MG tablet Commonly known as: PEPCID  Take 20 mg by  mouth 2 (two) times daily.   feeding supplement (GLUCERNA SHAKE) Liqd Take 237 mLs by mouth 3 (three) times daily between meals.   ferrous sulfate  325 (65 FE) MG tablet Take 1 tablet (325 mg total) by mouth every other day. What changed: when to take this   fluticasone  50 MCG/ACT nasal spray Commonly known as: FLONASE  Place 1 spray into both nostrils in the morning and at bedtime.   furosemide  20 MG tablet Commonly known as: LASIX  Take 20 mg by mouth in the morning.   guaiFENesin  600 MG 12 hr tablet Commonly known as: MUCINEX  Take 600 mg by mouth 2 (two) times daily.   guaiFENesin -dextromethorphan 100-10 MG/5ML syrup Commonly known as: ROBITUSSIN DM Take 10 mLs by mouth every 6 (six) hours as needed for cough.   insulin  aspart 100 UNIT/ML injection Commonly known as: novoLOG  Inject 0-15 Units into the skin every 4 (four) hours. Sliding scale  CBG 70 - 120: 0 units: CBG 121 - 150: 2 units; CBG 151 - 200: 3 units;  CBG 201 - 250: 5 units; CBG 251 - 300: 8 units;CBG 301 - 350: 11 units; CBG 351 - 400: 15 units; CBG > 400 : 15 units and notify MD What changed:  how much to take when to take this additional instructions   insulin  glargine-yfgn 100 UNIT/ML Pen Commonly known as: Semglee  (yfgn) Inject 24 Units into the skin 2 (two) times daily. What changed: how much to take   ipratropium-albuterol  0.5-2.5 (3) MG/3ML Soln Commonly known as: DUONEB Take 3 mLs by nebulization every 4 (four) hours as needed (as directed, prescriber).   lamoTRIgine  25 MG tablet Commonly known as: LAMICTAL  Take 50 mg by mouth 2 (two) times daily. What changed: Another medication with the same name was removed. Continue taking this medication, and follow the directions you see here.   loratadine  10 MG tablet Commonly known as: CLARITIN  Take 10 mg by mouth daily.   metFORMIN  500 MG 24 hr tablet Commonly known as: GLUCOPHAGE -XR Take 500 mg by mouth 2 (two) times daily before a meal.   nystatin   100000 UNIT/ML suspension Commonly known as: MYCOSTATIN  Take 5 mLs (500,000 Units total) by mouth 4 (four) times daily for 5 days.   ondansetron  4 MG disintegrating tablet Commonly known as: ZOFRAN -ODT Take 4 mg by mouth every 8 (eight) hours as needed for nausea or vomiting (dissolve orally).   oxyCODONE  5 MG immediate release tablet Commonly known as: Oxy IR/ROXICODONE  Take 1 tablet (5 mg total) by mouth every 6 (six) hours as needed for severe pain (pain score 7-10) or moderate pain (pain score 4-6) (for left shoulder pain). What changed: reasons to take this   Pataday  0.1 % ophthalmic solution Generic drug: olopatadine  Place 1 drop into both eyes 2 (two) times daily.   phenol 1.4 % Liqd Commonly known as: CHLORASEPTIC Use as directed 1 spray in the mouth or throat as needed for throat irritation / pain.   polyethylene glycol 17 g packet Commonly known as: MIRALAX  / GLYCOLAX  Take 17 g by mouth daily as needed for mild constipation or moderate constipation. What changed: reasons to take this   potassium chloride  10 MEQ tablet Commonly known as: KLOR-CON  Take 10 mEq by mouth daily.   RITUXAN  IV Inject 10 mg into the vein once. Between the 12th-13th of June, December.   senna-docusate 8.6-50 MG tablet Commonly known as: Senokot-S Take 1 tablet by mouth 2 (two) times daily.   thiamine 100 MG tablet Commonly known as: Vitamin B-1 Take 1 tablet (100 mg total) by mouth daily. Start taking on: June 25, 2024   traZODone  100 MG tablet Commonly known as: DESYREL  Take 100 mg by mouth at bedtime.   zinc  sulfate (50mg  elemental zinc ) 220 (50 Zn) MG capsule Take 1 capsule (220 mg total) by mouth daily.               Discharge Care Instructions  (From admission, onward)           Start     Ordered   06/24/24 0000  Discharge wound care:       Comments: Wound care  Every shift      Comments: Left thigh wound - Please change wet-to-dry dressing BID or when  soiled.  Wound care  Every other day    Comments: Left and right shin: Cleanse LE wounds with saline, pat dry the peri-wound skin. Cover with single layer of xeroform daily, top with silicone foam dressing. The foam can stay up to 3 days if  is not saturated, it is ok to lift the foam and reapply Xeroform.   Wound care  Daily      Comments: Cleanse ischial wounds and sacral wound with Vashe Soila [phone]), pat dry. Pack with Dakins 1/4% moist gauze, top with ABD pads, secure with tape. Change BID   06/24/24 1325            Contact information for after-discharge care     Destination     [facility] [place] .   Service: Skilled Nursing Contact information: [address] [place]  [address] [phone]                      Time coordinating discharge: 39 minutes  Signed:  Loise Esguerra  [facility] 06/24/2024, 1:25 PM

## 2024-06-24 NOTE — Progress Notes (Signed)
 PROGRESS NOTE  Brent [name]. FMW:[id number] DOB: 19-Feb-1946 DOA: 06/15/2024 PCP: Feliciano Devoria LABOR, MD   LOS: 9 days   Brief narrative:  Patient is a 78 years old male with PMH significant for CAD status post DES, diabetes type 2, hypertension, hyperlipidemia, history of melanoma, OSA, transverse myelitis, stage IV sacral decubitus ulcer, history of chronic OM, colostomy and suprapubic catheter presented to the ED with concerns of worsening decubitus ulcer and fatigue.[name]  He has had stage IV ulcers for 4-years, wound appears to be getting worse, patient was also reported to be confused at the rehab facility with decreased oral intake and nausea for few weeks. In the ED, CT scan of the abdomen and pelvis showed large decubitus ulcer overlying the sacrum and bilateral ischial tuberosity.  Finding compatible with acute osteomyelitis within the left ischium and chronic osteomyelitis within the right ischium.  New 4 x 6 cm complex fluid collection within the subcutaneous tissue posterior to the left thigh, immediately inferior to the left ischial decubitus ulcer consistent with abscess.  General surgery was consulted and patient underwent wound exploration with incision and drainage and open packing of the left posterior thigh abscess on 9/30        Assessment/Plan: Principal Problem:   Severe sepsis secondary to acute OM of left ischium and posterior thigh abscess Active Problems:   AKI (acute kidney injury)   DM type 2 (diabetes mellitus, type 2) (HCC)   (HFpEF) heart failure with preserved ejection fraction (HCC)   Essential hypertension   Coronary artery disease involving native coronary artery of native heart without angina pectoris   Normocytic anemia   Neurogenic bladder   Hyperlipidemia   GERD (gastroesophageal reflux disease)   Colostomy present (HCC)   Chronic osteomyelitis of pelvic region, right (HCC)   Obstructive sleep apnea   Thigh abscess   Acute osteomyelitis involving  pelvic region and thigh, left (HCC)   Wound infection  Severe sepsis secondary to Acute osteomyelitis of the left ischium and posterior thigh abscess: Staph warneri + bacteremia-likely to be contaminant Stage IV right and left lower mid upper decubitus ulcer and left thigh pressure posterior abscess: Continue IV antibiotics.  Status post wound exploration with incision and drainage and open packing active left posterior thigh abscess by surgery on 9/30.  Plan is to transition to oral Doxycycline  and Augmentin  for 7 days   Acute kidney injury: > Resolved. Initial creatinine of 1.4.  Creatinine today at 0.4   Hypokalemia:  Improved after replacement.  Latest potassium of 3.8   Proteus UTI: Urine culture growing 100,000 colonies of Proteus mirabilis.  Patient was initially on cefepime .  Currently on doxycycline  and Augmentin .   ' Diabetes type 2 with Hyperglycemia: Improved.  Continue to hold metformin .  Last Hb A1c 7.1. Continue sliding scale and Semglee .  Latest POC glucose of 1 23   Chronic diastolic heart failure with preserved ejection fraction: Lasix  on hold due to poor oral intake.  Currently compensated so we will continue to hold   Essential hypertension: Diuretics and antihypertensives on hold.  Was on midodrine .  Will discontinue for now.   Coronary artery disease involving native coronary artery of native heart without angina pectoris: No acute issues.  Continue Lipitor.   Acute on chronic normocytic anemia Baseline hemoglobin 9-10 Latest hemoglobin of 8.9.  Received 1 unit of packed RBC during hospitalization.   Neurogenic bladder: Status post suprapubic catheter, continue foley care.   Hyperlipidemia: Continue Lipitor   GERD (gastroesophageal reflux disease):  Continue pepcid  BID.   Colostomy present Colostomy care .   Chronic osteomyelitis of pelvic region, right Seen by ID in April of 2025    Obstructive sleep apnea: Not on CPAP    FTT; poor oral intake  . Continue Glucerna   Delirium with acute metabolic encephalopathy Improved.  Likely secondary to infection  Deconditioning debility.  Plan for skilled nursing facility on discharge  Wound 06/15/24 2300 Pressure Injury Buttocks Right;Left;Lower;Mid;Upper Stage 4 - Full thickness tissue loss with exposed bone, tendon or muscle. (Active)     Wound 06/15/24 2300 Pressure Injury Thigh Left;Posterior;Proximal (Active)      DVT prophylaxis: enoxaparin  (LOVENOX ) injection 40 mg Start: 06/15/24 2200   Disposition: Skilled nursing facility as per PT evaluation.  Medically stable for disposition  Status is: Inpatient Remains inpatient appropriate because: Need for skilled nursing facility placement    Code Status:     Code Status: Full Code  Family Communication: None at bedside  Consultants: General Surgery Infectious disease  Procedures: Incision and drainage  Anti-infectives:  Augmentin  and doxycycline   Anti-infectives (From admission, onward)    Start     Dose/Rate Route Frequency Ordered Stop   06/22/24 1600  amoxicillin -clavulanate (AUGMENTIN ) 875-125 MG per tablet 1 tablet        1 tablet Oral Every 12 hours 06/22/24 1400 06/25/24 0959   06/22/24 1415  doxycycline  (VIBRA -TABS) tablet 100 mg        100 mg Oral Every 12 hours 06/22/24 1400 06/25/24 0959   06/21/24 1600  ceFEPIme  (MAXIPIME ) 2 g in sodium chloride  0.9 % 100 mL IVPB  Status:  Discontinued        2 g 200 mL/hr over 30 Minutes Intravenous Every 8 hours 06/21/24 1507 06/22/24 1400   06/21/24 1000  Ampicillin -Sulbactam (UNASYN ) 3 g in sodium chloride  0.9 % 100 mL IVPB  Status:  Discontinued        3 g 200 mL/hr over 30 Minutes Intravenous Every 6 hours 06/21/24 0822 06/21/24 1507   06/20/24 1000  doxycycline  (VIBRAMYCIN ) 100 mg in sodium chloride  0.9 % 250 mL IVPB  Status:  Discontinued        100 mg 125 mL/hr over 120 Minutes Intravenous Every 12 hours 06/20/24 0840 06/21/24 1507   06/20/24 0900   Ampicillin -Sulbactam (UNASYN ) 3 g in sodium chloride  0.9 % 100 mL IVPB  Status:  Discontinued        3 g 200 mL/hr over 30 Minutes Intravenous Every 6 hours 06/20/24 0840 06/21/24 0822   06/16/24 2000  ceFEPIme  (MAXIPIME ) 2 g in sodium chloride  0.9 % 100 mL IVPB  Status:  Discontinued        2 g 200 mL/hr over 30 Minutes Intravenous Every 12 hours 06/16/24 1408 06/20/24 0840   06/16/24 1500  vancomycin  (VANCOCIN ) IVPB 1000 mg/200 mL premix  Status:  Discontinued        1,000 mg 200 mL/hr over 60 Minutes Intravenous Every 24 hours 06/15/24 1731 06/16/24 1353   06/16/24 1500  vancomycin  (VANCOREADY) IVPB 1250 mg/250 mL  Status:  Discontinued        1,250 mg 166.7 mL/hr over 90 Minutes Intravenous Every 24 hours 06/16/24 1353 06/20/24 0840   06/16/24 1400  ceFEPIme  (MAXIPIME ) 2 g in sodium chloride  0.9 % 100 mL IVPB  Status:  Discontinued        2 g 200 mL/hr over 30 Minutes Intravenous Every 8 hours 06/16/24 1353 06/16/24 1408   06/16/24 0600  ceFEPIme  (MAXIPIME ) 2  g in sodium chloride  0.9 % 100 mL IVPB  Status:  Discontinued        2 g 200 mL/hr over 30 Minutes Intravenous Every 12 hours 06/15/24 1730 06/16/24 1353   06/15/24 1330  vancomycin  (VANCOCIN ) IVPB 1000 mg/200 mL premix        1,000 mg 200 mL/hr over 60 Minutes Intravenous  Once 06/15/24 1329 06/15/24 1601   06/15/24 1330  ceFEPIme  (MAXIPIME ) 2 g in sodium chloride  0.9 % 100 mL IVPB        2 g 200 mL/hr over 30 Minutes Intravenous  Once 06/15/24 1329 06/15/24 1825   06/15/24 1300  cefTRIAXone  (ROCEPHIN ) 1 g in sodium chloride  0.9 % 100 mL IVPB  Status:  Discontinued        1 g 200 mL/hr over 30 Minutes Intravenous  Once 06/15/24 1259 06/15/24 1340        Subjective: Today, patient was seen and examined at bedside.  Patient inquiring about discharge.  Denies any nausea vomiting fever chills or rigor.  Objective: Vitals:   06/24/24 0942 06/24/24 1259  BP: 121/69 (!) 160/67  Pulse:  79  Resp:    Temp:    SpO2:       Intake/Output Summary (Last 24 hours) at 06/24/2024 1310 Last data filed at 06/24/2024 1240 Gross per 24 hour  Intake 600 ml  Output 1125 ml  Net -525 ml   Filed Weights   06/15/24 1221 06/18/24 0829  Weight: 68 kg 68 kg   Body mass index is 20.34 kg/m.   Physical Exam: GENERAL: Patient is alert awake and oriented. Not in obvious distress. HENT: Mild pallor noted.  Pupils equally reactive to light. Oral mucosa is moist NECK: is supple, no gross swelling noted. CHEST: Clear to auscultation. No crackles or wheezes.  Diminished breath sounds bilaterally. CVS: S1 and S2 heard, no murmur. Regular rate and rhythm.  ABDOMEN: Soft, non-tender, bowel sounds are present.  Colostomy noted in the left lower quadrant.  Suprapubic catheter in place EXTREMITIES: Bilateral lower extremities with heel pads CNS: Cranial nerves are intact. No focal motor deficits. SKIN: warm and dry, pressure injury over the buttocks, legs  Data Review: I have personally reviewed the following laboratory data and studies,  CBC: Recent Labs  Lab 06/19/24 0458 06/20/24 0601 06/21/24 0447 06/22/24 0555 06/23/24 0625  WBC 11.9* 9.5 11.5* 10.0 9.8  HGB 9.1* 8.7* 8.9* 8.9* 8.9*  HCT 30.4* 29.8* 30.0* 30.2* 30.2*  MCV 82.4 83.7 83.1 81.4 81.0  PLT 394 376 351 327 289   Basic Metabolic Panel: Recent Labs  Lab 06/18/24 0555 06/19/24 0458 06/20/24 0601 06/21/24 0447 06/22/24 0555 06/23/24 0625 06/24/24 0921  NA 136 138 137 141 135 136 133*  K 3.3* 3.0* 2.9* 3.6 3.4* 3.4* 3.8  CL 107 109 111 113* 108 107 105  CO2 18* 18* 17* 19* 20* 22 21*  GLUCOSE 271* 189* 257* 196* 173* 136* 152*  BUN 27* 19 14 11 9 9 9   CREATININE 0.63 0.56* 0.49* 0.50* 0.44* 0.45* 0.44*  CALCIUM  8.7* 8.5* 8.4* 8.3* 8.3* 8.5* 8.3*  MG  --   --   --   --  1.6* 1.7  --   PHOS 3.4 2.9 2.7  --  1.9* 2.9  --    Liver Function Tests: Recent Labs  Lab 06/18/24 0555 06/19/24 0458 06/20/24 0601 06/23/24 0625  AST  --   --   --   24  ALT  --   --   --  19  ALKPHOS  --   --   --  107  BILITOT  --   --   --  0.2  PROT  --   --   --  5.5*  ALBUMIN  2.4* 2.2* 2.1* 2.3*   No results for input(s): LIPASE, AMYLASE in the last 168 hours. No results for input(s): AMMONIA in the last 168 hours. Cardiac Enzymes: No results for input(s): CKTOTAL, CKMB, CKMBINDEX, TROPONINI in the last 168 hours. BNP (last 3 results) Recent Labs    07/14/23 1408 07/19/23 0920 07/21/23 0624  BNP 168.3* 136.4* 73.6    ProBNP (last 3 results) No results for input(s): PROBNP in the last 8760 hours.  CBG: Recent Labs  Lab 06/23/24 1106 06/23/24 1602 06/23/24 2138 06/24/24 0712 06/24/24 1109  GLUCAP 160* 136* 150* 148* 123*   Recent Results (from the past 240 hours)  Resp panel by RT-PCR (RSV, Flu A&B, Covid) Anterior Nasal Swab     Status: None   Collection Time: 06/15/24 12:24 PM   Specimen: Anterior Nasal Swab  Result Value Ref Range Status   SARS Coronavirus 2 by RT PCR NEGATIVE NEGATIVE Final    Comment: (NOTE) SARS-CoV-2 target nucleic acids are NOT DETECTED.  The SARS-CoV-2 RNA is generally detectable in upper respiratory specimens during the acute phase of infection. The lowest concentration of SARS-CoV-2 viral copies this assay can detect is 138 copies/mL. A negative result does not preclude SARS-Cov-2 infection and should not be used as the sole basis for treatment or other patient management decisions. A negative result may occur with  improper specimen collection/handling, submission of specimen other than nasopharyngeal swab, presence of viral mutation(s) within the areas targeted by this assay, and inadequate number of viral copies(<138 copies/mL). A negative result must be combined with clinical observations, patient history, and epidemiological information. The expected result is Negative.  Fact Sheet for Patients:  BloggerCourse.com  Fact Sheet for Healthcare  Providers:  SeriousBroker.it  This test is no t yet approved or cleared by the [place]  FDA and  has been authorized for detection and/or diagnosis of SARS-CoV-2 by FDA under an Emergency Use Authorization (EUA). This EUA will remain  in effect (meaning this test can be used) for the duration of the COVID-19 declaration under Section 564(b)(1) of the Act, 21 U.S.C.section 360bbb-3(b)(1), unless the authorization is terminated  or revoked sooner.       Influenza A by PCR NEGATIVE NEGATIVE Final   Influenza B by PCR NEGATIVE NEGATIVE Final    Comment: (NOTE) The Xpert Xpress SARS-CoV-2/FLU/RSV plus assay is intended as an aid in the diagnosis of influenza from Nasopharyngeal swab specimens and should not be used as a sole basis for treatment. Nasal washings and aspirates are unacceptable for Xpert Xpress SARS-CoV-2/FLU/RSV testing.  Fact Sheet for Patients: BloggerCourse.com  Fact Sheet for Healthcare Providers: SeriousBroker.it  This test is not yet approved or cleared by the [place]  FDA and has been authorized for detection and/or diagnosis of SARS-CoV-2 by FDA under an Emergency Use Authorization (EUA). This EUA will remain in effect (meaning this test can be used) for the duration of the COVID-19 declaration under Section 564(b)(1) of the Act, 21 U.S.C. section 360bbb-3(b)(1), unless the authorization is terminated or revoked.     Resp Syncytial Virus by PCR NEGATIVE NEGATIVE Final    Comment: (NOTE) Fact Sheet for Patients: BloggerCourse.com  Fact Sheet for Healthcare Providers: SeriousBroker.it  This test is not yet approved or cleared by the [place]  FDA  and has been authorized for detection and/or diagnosis of SARS-CoV-2 by FDA under an Emergency Use Authorization (EUA). This EUA will remain in effect (meaning this test can be  used) for the duration of the COVID-19 declaration under Section 564(b)(1) of the Act, 21 U.S.C. section 360bbb-3(b)(1), unless the authorization is terminated or revoked.  Performed at [facility], [address], [place], [place] [address]   Urine Culture     Status: Abnormal   Collection Time: 06/15/24 12:25 PM   Specimen: Urine, Random  Result Value Ref Range Status   Specimen Description   Final    URINE, RANDOM Performed at [facility], [address], [place], [place] [address]    Special Requests   Final    NONE Reflexed from [id number] Performed at [facility], [address], [place], [place] [address]    Culture >=100,000 COLONIES/mL PROTEUS MIRABILIS (A)  Final   Report Status 06/18/2024 FINAL  Final   Organism ID, Bacteria PROTEUS MIRABILIS (A)  Final      Susceptibility   Proteus mirabilis - MIC*    AMPICILLIN  <=2 SENSITIVE Sensitive     CEFAZOLIN  (URINE) Value in next row Sensitive      4 SENSITIVEThis is a modified FDA-approved test that has been validated and its performance characteristics determined by the reporting laboratory.  This laboratory is certified under the Clinical Laboratory Improvement Amendments CLIA as qualified to perform high complexity clinical laboratory testing.    CEFEPIME  Value in next row Sensitive      4 SENSITIVEThis is a modified FDA-approved test that has been validated and its performance characteristics determined by the reporting laboratory.  This laboratory is certified under the Clinical Laboratory Improvement Amendments CLIA as qualified to perform high complexity clinical laboratory testing.    ERTAPENEM Value in next row Sensitive      4 SENSITIVEThis is a modified FDA-approved test that has been validated and its performance characteristics determined by the reporting laboratory.  This laboratory is certified under the Clinical Laboratory Improvement Amendments CLIA as  qualified to perform high complexity clinical laboratory testing.    CEFTRIAXONE  Value in next row Sensitive      4 SENSITIVEThis is a modified FDA-approved test that has been validated and its performance characteristics determined by the reporting laboratory.  This laboratory is certified under the Clinical Laboratory Improvement Amendments CLIA as qualified to perform high complexity clinical laboratory testing.    CIPROFLOXACIN  Value in next row Sensitive      4 SENSITIVEThis is a modified FDA-approved test that has been validated and its performance characteristics determined by the reporting laboratory.  This laboratory is certified under the Clinical Laboratory Improvement Amendments CLIA as qualified to perform high complexity clinical laboratory testing.    GENTAMICIN  Value in next row Sensitive      4 SENSITIVEThis is a modified FDA-approved test that has been validated and its performance characteristics determined by the reporting laboratory.  This laboratory is certified under the Clinical Laboratory Improvement Amendments CLIA as qualified to perform high complexity clinical laboratory testing.    NITROFURANTOIN Value in next row Resistant      4 SENSITIVEThis is a modified FDA-approved test that has been validated and its performance characteristics determined by the reporting laboratory.  This laboratory is certified under the Clinical Laboratory Improvement Amendments CLIA as qualified to perform high complexity clinical laboratory testing.    TRIMETH /SULFA  Value in next row Resistant  4 SENSITIVEThis is a modified FDA-approved test that has been validated and its performance characteristics determined by the reporting laboratory.  This laboratory is certified under the Clinical Laboratory Improvement Amendments CLIA as qualified to perform high complexity clinical laboratory testing.    AMPICILLIN /SULBACTAM Value in next row Sensitive      4 SENSITIVEThis is a modified FDA-approved  test that has been validated and its performance characteristics determined by the reporting laboratory.  This laboratory is certified under the Clinical Laboratory Improvement Amendments CLIA as qualified to perform high complexity clinical laboratory testing.    PIP/TAZO Value in next row Sensitive      <=4 SENSITIVEThis is a modified FDA-approved test that has been validated and its performance characteristics determined by the reporting laboratory.  This laboratory is certified under the Clinical Laboratory Improvement Amendments CLIA as qualified to perform high complexity clinical laboratory testing.    MEROPENEM  Value in next row Sensitive      <=4 SENSITIVEThis is a modified FDA-approved test that has been validated and its performance characteristics determined by the reporting laboratory.  This laboratory is certified under the Clinical Laboratory Improvement Amendments CLIA as qualified to perform high complexity clinical laboratory testing.    * >=100,000 COLONIES/mL PROTEUS MIRABILIS  Blood Culture (routine x 2)     Status: None   Collection Time: 06/15/24 12:26 PM   Specimen: BLOOD LEFT FOREARM  Result Value Ref Range Status   Specimen Description   Final    BLOOD LEFT FOREARM Performed at [facility], [address] [place], [place] [address]    Special Requests   Final    BOTTLES DRAWN AEROBIC AND ANAEROBIC Blood Culture adequate volume Performed at [facility], [address], [place], [place] [address]    Culture   Final    NO GROWTH 5 DAYS Performed at [facility], [address], [place], [place] [address]    Report Status 06/21/2024 FINAL  Final  Blood Culture (routine x 2)     Status: Abnormal   Collection Time: 06/15/24 12:27 PM   Specimen: BLOOD RIGHT FOREARM  Result Value Ref Range Status   Specimen Description   Final    BLOOD RIGHT FOREARM Performed at [facility], [address] [place], [place] [address]    Special  Requests   Final    BOTTLES DRAWN AEROBIC AND ANAEROBIC Blood Culture adequate volume Performed at [facility], [address] [place], [place] [address]    Culture  Setup Time   Final    GRAM POSITIVE COCCI ANAEROBIC BOTTLE ONLY CRITICAL RESULT CALLED TO, READ BACK BY AND VERIFIED WITH: LEANNE POINDEXTER ON 06/16/24 AT 0629 QSD    Culture (A)  Final    STAPHYLOCOCCUS WARNERI THE SIGNIFICANCE OF ISOLATING THIS ORGANISM FROM A SINGLE SET OF BLOOD CULTURES WHEN MULTIPLE SETS ARE DRAWN IS UNCERTAIN. PLEASE NOTIFY THE MICROBIOLOGY DEPARTMENT WITHIN ONE WEEK IF SPECIATION AND SENSITIVITIES ARE REQUIRED. Performed at [facility], [address] [place], [place] [address]    Report Status 06/18/2024 FINAL  Final  Blood Culture ID Panel (Reflexed)     Status: Abnormal   Collection Time: 06/15/24 12:27 PM  Result Value Ref Range Status   Enterococcus faecalis NOT DETECTED NOT DETECTED Final   Enterococcus Faecium NOT DETECTED NOT DETECTED Final   Listeria monocytogenes NOT DETECTED NOT DETECTED Final   Staphylococcus species DETECTED (A) NOT DETECTED Final  Comment: CRITICAL RESULT CALLED TO, READ BACK BY AND VERIFIED WITH: LEANNE POINDEXTER ON 06/16/24 AT 9370 QSD    Staphylococcus aureus (BCID) NOT DETECTED NOT DETECTED Final   Staphylococcus epidermidis NOT DETECTED NOT DETECTED Final   Staphylococcus lugdunensis NOT DETECTED NOT DETECTED Final   Streptococcus species NOT DETECTED NOT DETECTED Final   Streptococcus agalactiae NOT DETECTED NOT DETECTED Final   Streptococcus pneumoniae NOT DETECTED NOT DETECTED Final   Streptococcus pyogenes NOT DETECTED NOT DETECTED Final   A.calcoaceticus-baumannii NOT DETECTED NOT DETECTED Final   Bacteroides fragilis NOT DETECTED NOT DETECTED Final   Enterobacterales NOT DETECTED NOT DETECTED Final   Enterobacter cloacae complex NOT DETECTED NOT DETECTED Final   Escherichia coli NOT DETECTED NOT DETECTED Final    Klebsiella aerogenes NOT DETECTED NOT DETECTED Final   Klebsiella oxytoca NOT DETECTED NOT DETECTED Final   Klebsiella pneumoniae NOT DETECTED NOT DETECTED Final   Proteus species NOT DETECTED NOT DETECTED Final   Salmonella species NOT DETECTED NOT DETECTED Final   Serratia marcescens NOT DETECTED NOT DETECTED Final   Haemophilus influenzae NOT DETECTED NOT DETECTED Final   Neisseria meningitidis NOT DETECTED NOT DETECTED Final   Pseudomonas aeruginosa NOT DETECTED NOT DETECTED Final   Stenotrophomonas maltophilia NOT DETECTED NOT DETECTED Final   Candida albicans NOT DETECTED NOT DETECTED Final   Candida auris NOT DETECTED NOT DETECTED Final   Candida glabrata NOT DETECTED NOT DETECTED Final   Candida krusei NOT DETECTED NOT DETECTED Final   Candida parapsilosis NOT DETECTED NOT DETECTED Final   Candida tropicalis NOT DETECTED NOT DETECTED Final   Cryptococcus neoformans/gattii NOT DETECTED NOT DETECTED Final    Comment: Performed at [facility], [address] [place], [place] [address]  MRSA Next Gen by PCR, Nasal     Status: Abnormal   Collection Time: 06/16/24  4:42 AM   Specimen: Nasal Mucosa; Nasal Swab  Result Value Ref Range Status   MRSA by PCR Next Gen DETECTED (A) NOT DETECTED Final    Comment: Results Called to: [employer], J RN AT 1107 ON 06/16/2024 BY PRUDY, K (NOTE) The GeneXpert MRSA Assay (FDA approved for NASAL specimens only), is one component of a comprehensive MRSA colonization surveillance program. It is not intended to diagnose MRSA infection nor to guide or monitor treatment for MRSA infections. Test performance is not FDA approved in patients less than 110 years old. Performed at [facility], [address], [place], [place] [address]   Aerobic/Anaerobic Culture w Gram Stain (surgical/deep wound)     Status: None   Collection Time: 06/18/24 10:40 AM   Specimen: Wound; Abscess  Result Value Ref Range Status   Specimen  Description   Final    WOUND Performed at [facility], [address], [place], [place] [address]    Special Requests   Final    LEFT POSTERIOR THIGH MASS Performed at [facility], [address] [place], [place] [address]    Gram Stain   Final    MODERATE WBC PRESENT, PREDOMINANTLY PMN RARE GRAM POSITIVE COCCI IN CLUSTERS RARE GRAM NEGATIVE RODS    Culture   Final    RARE PROTEUS MIRABILIS RARE ESCHERICHIA COLI FEW PREVOTELLA BUCCAE BETA LACTAMASE POSITIVE Performed at [facility], [address], [place], [place] [address]    Report Status 06/23/2024 FINAL  Final   Organism ID, Bacteria PROTEUS MIRABILIS  Final   Organism ID, Bacteria ESCHERICHIA COLI  Final  Susceptibility   Escherichia coli - MIC*    AMPICILLIN  >=32 RESISTANT Resistant     CEFAZOLIN  (NON-URINE) 4 INTERMEDIATE Intermediate     CEFEPIME  <=0.12 SENSITIVE Sensitive     ERTAPENEM <=0.12 SENSITIVE Sensitive     CEFTRIAXONE  <=0.25 SENSITIVE Sensitive     CIPROFLOXACIN  >=4 RESISTANT Resistant     GENTAMICIN  <=1 SENSITIVE Sensitive     MEROPENEM  <=0.25 SENSITIVE Sensitive     TRIMETH /SULFA  <=20 SENSITIVE Sensitive     AMPICILLIN /SULBACTAM 16 INTERMEDIATE Intermediate     PIP/TAZO Value in next row Sensitive      <=4 SENSITIVEThis is a modified FDA-approved test that has been validated and its performance characteristics determined by the reporting laboratory.  This laboratory is certified under the Clinical Laboratory Improvement Amendments CLIA as qualified to perform high complexity clinical laboratory testing.    * RARE ESCHERICHIA COLI   Proteus mirabilis - MIC*    AMPICILLIN  Value in next row Sensitive      <=4 SENSITIVEThis is a modified FDA-approved test that has been validated and its performance characteristics determined by the reporting laboratory.  This laboratory is certified under the Clinical Laboratory Improvement Amendments CLIA as qualified to  perform high complexity clinical laboratory testing.    CEFAZOLIN  (NON-URINE) Value in next row Intermediate      <=4 SENSITIVEThis is a modified FDA-approved test that has been validated and its performance characteristics determined by the reporting laboratory.  This laboratory is certified under the Clinical Laboratory Improvement Amendments CLIA as qualified to perform high complexity clinical laboratory testing.    CEFEPIME  Value in next row Sensitive      <=4 SENSITIVEThis is a modified FDA-approved test that has been validated and its performance characteristics determined by the reporting laboratory.  This laboratory is certified under the Clinical Laboratory Improvement Amendments CLIA as qualified to perform high complexity clinical laboratory testing.    ERTAPENEM Value in next row Sensitive      <=4 SENSITIVEThis is a modified FDA-approved test that has been validated and its performance characteristics determined by the reporting laboratory.  This laboratory is certified under the Clinical Laboratory Improvement Amendments CLIA as qualified to perform high complexity clinical laboratory testing.    CEFTRIAXONE  Value in next row Sensitive      <=4 SENSITIVEThis is a modified FDA-approved test that has been validated and its performance characteristics determined by the reporting laboratory.  This laboratory is certified under the Clinical Laboratory Improvement Amendments CLIA as qualified to perform high complexity clinical laboratory testing.    CIPROFLOXACIN  Value in next row Sensitive      <=4 SENSITIVEThis is a modified FDA-approved test that has been validated and its performance characteristics determined by the reporting laboratory.  This laboratory is certified under the Clinical Laboratory Improvement Amendments CLIA as qualified to perform high complexity clinical laboratory testing.    GENTAMICIN  Value in next row Sensitive      <=4 SENSITIVEThis is a modified FDA-approved test  that has been validated and its performance characteristics determined by the reporting laboratory.  This laboratory is certified under the Clinical Laboratory Improvement Amendments CLIA as qualified to perform high complexity clinical laboratory testing.    MEROPENEM  Value in next row Sensitive      <=4 SENSITIVEThis is a modified FDA-approved test that has been validated and its performance characteristics determined by the reporting laboratory.  This laboratory is certified under the Clinical Laboratory Improvement Amendments CLIA as qualified to perform high complexity clinical laboratory testing.  TRIMETH /SULFA  Value in next row Resistant      <=4 SENSITIVEThis is a modified FDA-approved test that has been validated and its performance characteristics determined by the reporting laboratory.  This laboratory is certified under the Clinical Laboratory Improvement Amendments CLIA as qualified to perform high complexity clinical laboratory testing.    AMPICILLIN /SULBACTAM Value in next row Sensitive      <=4 SENSITIVEThis is a modified FDA-approved test that has been validated and its performance characteristics determined by the reporting laboratory.  This laboratory is certified under the Clinical Laboratory Improvement Amendments CLIA as qualified to perform high complexity clinical laboratory testing.    PIP/TAZO Value in next row Sensitive      <=4 SENSITIVEThis is a modified FDA-approved test that has been validated and its performance characteristics determined by the reporting laboratory.  This laboratory is certified under the Clinical Laboratory Improvement Amendments CLIA as qualified to perform high complexity clinical laboratory testing.    * RARE PROTEUS MIRABILIS     Studies: No results found.    Hashir Deleeuw, MD  [facility] 06/24/2024  If 7PM-7AM, please contact night-coverage

## 2024-06-24 NOTE — TOC Transition Note (Addendum)
 Transition of Care [facility]) - Discharge Note   Patient Details  Name: Brent [name]. MRN: [id number] Date of Birth: July 11, 1946  Transition of Care [facility]) CM/SW Contact:  Bascom Service, RN Phone Number: 06/24/2024, 1:09 PM   Clinical Narrative: d/c back to [facility] rep [facility] aware. Jean(spouse) aware of return. PTAR @ d/c. -2:18p going to [facility] rm#[id number], report #[phone]. PTAR called. No further CM needs.       Final next level of care: Long Term Nursing Home Barriers to Discharge: No Barriers Identified   Patient Goals and CMS Choice Patient states their goals for this hospitalization and ongoing recovery are:: LTC return [facility] CMS Medicare.gov Compare Post Acute Care list provided to:: Patient Represenative (must comment) Choice offered to / list presented to : Spouse  ownership interest in [facility].provided to:: Spouse    Discharge Placement                       Discharge Plan and Services Additional resources added to the After Visit Summary for     Discharge Planning Services: CM Consult Post Acute Care Choice: Skilled Nursing Facility                               Social Drivers of Health (SDOH) Interventions SDOH Screenings   Food Insecurity: No Food Insecurity (06/15/2024)  Housing: Low Risk  (06/15/2024)  Transportation Needs: No Transportation Needs (06/15/2024)  Utilities: Not At Risk (06/15/2024)  Depression (PHQ2-9): Low Risk  (12/21/2023)  Financial Resource Strain: Low Risk  (03/06/2020)  Physical Activity: Inactive (12/14/2018)  Social Connections: Socially Isolated (06/15/2024)  Stress: No Stress Concern Present (12/14/2018)  Tobacco Use: Medium Risk (06/18/2024)     Readmission Risk Interventions    07/31/2023    9:17 AM 07/02/2023   12:31 PM  Readmission Risk Prevention Plan  Transportation Screening Complete Complete  Medication Review Oceanographer) Complete Complete  PCP or Specialist  appointment within 3-5 days of discharge Complete Not Complete  PCP/Specialist Appt Not Complete comments  SNF resident  HRI or Home Care Consult Complete Complete  SW Recovery Care/Counseling Consult Complete Complete  Palliative Care Screening Not Applicable Not Applicable  Skilled Nursing Facility Complete Complete

## 2024-06-24 NOTE — Progress Notes (Signed)
 Pt ready for discharge. PTAR at bedside and patient discharged off unit.

## 2024-08-24 ENCOUNTER — Other Ambulatory Visit: Payer: Self-pay

## 2024-08-24 ENCOUNTER — Emergency Department (HOSPITAL_COMMUNITY)
Admission: EM | Admit: 2024-08-24 | Discharge: 2024-08-24 | Disposition: A | Discharge status: Skilled Nursing Facility | Source: Skilled Nursing Facility | Attending: Emergency Medicine | Admitting: Emergency Medicine

## 2024-08-24 ENCOUNTER — Encounter (HOSPITAL_COMMUNITY): Payer: Self-pay

## 2024-08-24 DIAGNOSIS — Y732 Prosthetic and other implants, materials and accessory gastroenterology and urology devices associated with adverse incidents: Secondary | ICD-10-CM | POA: Insufficient documentation

## 2024-08-24 DIAGNOSIS — T83010A Breakdown (mechanical) of cystostomy catheter, initial encounter: Secondary | ICD-10-CM

## 2024-08-24 DIAGNOSIS — Z794 Long term (current) use of insulin: Secondary | ICD-10-CM | POA: Insufficient documentation

## 2024-08-24 DIAGNOSIS — Z7982 Long term (current) use of aspirin: Secondary | ICD-10-CM | POA: Insufficient documentation

## 2024-08-24 DIAGNOSIS — T83098A Other mechanical complication of other indwelling urethral catheter, initial encounter: Secondary | ICD-10-CM | POA: Insufficient documentation

## 2024-08-24 NOTE — ED Triage Notes (Signed)
 Pt BIBA from [facility]; Per EMS wound nurse noted no urine output in bag from suprapubic cath today only dark brown sediment. Bag was emptied yesterday, dark yellow urine;normal output.  Suprapubic cath. Placed on 07/25/24   Pt A&O; denies any pain. NAD. Pt Quadriplegic.

## 2024-08-24 NOTE — ED Provider Notes (Signed)
 [name]   CSN: [id number] Arrival date & time: 08/24/24  1439     Patient presents with: Urinary Retention   Brent [name]. is a 78 y.o. male.   HPI Patient is a 29 French suprapubic urinary catheter in place.  This was placed 11\6\25.  It was due to be changed on Monday.  Patient reports as of yesterday is started leaking around the catheter and not feeling the bag.  He denies any pain in the fever any chills or any other generalized symptoms.  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  He had recent hospitalization for sepsis due to osteomyelitis and ischial wound.  No acute complaints regarding this.    Prior to Admission medications   Medication Sig Start Date End Date Taking? Authorizing Provider  acetaminophen  (TYLENOL ) 500 MG tablet Take 2 tablets (1,000 mg total) by mouth every 6 (six) hours as needed for mild pain (or Fever >/= 101). 05/31/23   Samtani, Jai-Gurmukh, MD  albuterol  (ACCUNEB ) 1.25 MG/3ML nebulizer solution Take 1 ampule by nebulization 2 (two) times daily as needed (for coughing).    [provider]  ARTIFICIAL TEARS 1 % ophthalmic solution Place 1 drop into both eyes in the morning.    [provider]  aspirin -acetaminophen -caffeine (EXCEDRIN MIGRAINE) 250-250-65 MG tablet Take 2 tablets by mouth every 12 (twelve) hours as needed (for headaches).    [provider]  atenolol  (TENORMIN ) 25 MG tablet Take 12.5 mg by mouth in the morning.    [provider]  atorvastatin  (LIPITOR) 40 MG tablet Take 1 tablet (40 mg total) by mouth at bedtime. 05/31/23   Samtani, Jai-Gurmukh, MD  azelastine  (ASTELIN ) 0.1 % nasal spray Place 2 sprays into both nostrils in the morning.    [provider]  baclofen  5 MG TABS Take 1 tablet (5 mg total) by mouth 3 (three) times daily. 08/08/23   Raenelle Coria, MD  cholecalciferol  (VITAMIN D3)  25 MCG (1000 UNIT) tablet Take 2,000 Units by mouth daily.    [provider]  ciclopirox (PENLAC) 8 % solution Apply 1 application  topically See admin instructions. Apply over affected nails and surrounding skin. Apply daily over previous coat. After seven (7) days, may remove with alcohol  and continue cycle.    [provider]  DULoxetine  (CYMBALTA ) 30 MG capsule Take 1 capsule (30 mg total) by mouth at bedtime. 05/31/23   Samtani, Jai-Gurmukh, MD  famotidine  (PEPCID ) 20 MG tablet Take 20 mg by mouth 2 (two) times daily.    [provider]  feeding supplement, GLUCERNA SHAKE, (GLUCERNA SHAKE) LIQD Take 237 mLs by mouth 3 (three) times daily between meals. 06/24/24   Pokhrel, Vernal, MD  ferrous sulfate  325 (65 FE) MG tablet Take 1 tablet (325 mg total) by mouth every other day. Patient taking differently: Take 325 mg by mouth daily with breakfast. 07/27/23   Odell Celinda Balo, MD  fluticasone  (FLONASE ) 50 MCG/ACT nasal spray Place 1 spray into both nostrils in the morning and at bedtime.    [provider]  furosemide  (LASIX ) 20 MG tablet Take 20 mg by mouth in the morning.    [provider]  guaiFENesin  (MUCINEX ) 600 MG 12 hr tablet Take 600 mg by mouth 2 (two) times daily.    [provider]  guaiFENesin -dextromethorphan (ROBITUSSIN DM) 100-10 MG/5ML syrup Take 10 mLs by mouth every 6 (six) hours  as needed for cough.    [provider]  insulin  aspart (NOVOLOG ) 100 UNIT/ML injection Inject 0-15 Units into the skin every 4 (four) hours. Sliding scale  CBG 70 - 120: 0 units: CBG 121 - 150: 2 units; CBG 151 - 200: 3 units; CBG 201 - 250: 5 units; CBG 251 - 300: 8 units;CBG 301 - 350: 11 units; CBG 351 - 400: 15 units; CBG > 400 : 15 units and notify MD Patient taking differently: Inject 6 Units into the skin 3 (three) times daily with meals. 06/10/20   Rai, Nydia POUR, MD  insulin  glargine-yfgn (SEMGLEE , YFGN,) 100 UNIT/ML Pen Inject 24  Units into the skin 2 (two) times daily. Patient taking differently: Inject 26 Units into the skin 2 (two) times daily. 07/27/23   Odell Celinda Balo, MD  ipratropium-albuterol  (DUONEB) 0.5-2.5 (3) MG/3ML SOLN Take 3 mLs by nebulization every 4 (four) hours as needed (as directed, prescriber).    [provider]  lamoTRIgine  (LAMICTAL ) 25 MG tablet Take 50 mg by mouth 2 (two) times daily.    [provider]  loratadine  (CLARITIN ) 10 MG tablet Take 10 mg by mouth daily.    [provider]  Menthol , Topical Analgesic, (BIOFREEZE) 4 % GEL Apply 1 application  topically 2 (two) times daily as needed (pain to back (upper and lower),arms and shoulders).    [provider]  metFORMIN  (GLUCOPHAGE -XR) 500 MG 24 hr tablet Take 500 mg by mouth 2 (two) times daily before a meal.    [provider]  ondansetron  (ZOFRAN -ODT) 4 MG disintegrating tablet Take 4 mg by mouth every 8 (eight) hours as needed for nausea or vomiting (dissolve orally).    [provider]  oxyCODONE  (OXY IR/ROXICODONE ) 5 MG immediate release tablet Take 1 tablet (5 mg total) by mouth every 6 (six) hours as needed for severe pain (pain score 7-10) or moderate pain (pain score 4-6) (for left shoulder pain). 06/24/24 06/24/25  Pokhrel, Vernal, MD  PATADAY  0.1 % ophthalmic solution Place 1 drop into both eyes 2 (two) times daily.    [provider]  phenol (CHLORASEPTIC) 1.4 % LIQD Use as directed 1 spray in the mouth or throat as needed for throat irritation / pain.    [provider]  polyethylene glycol (MIRALAX  / GLYCOLAX ) 17 g packet Take 17 g by mouth daily as needed for mild constipation or moderate constipation. 06/24/24   Pokhrel, Vernal, MD  potassium chloride  (KLOR-CON ) 10 MEQ tablet Take 10 mEq by mouth daily.    [provider]  riTUXimab  (RITUXAN  IV) Inject 10 mg into the vein once. Between the 12th-13th of June, December.    [provider]   senna-docusate (SENOKOT-S) 8.6-50 MG tablet Take 1 tablet by mouth 2 (two) times daily.    [provider]  traZODone  (DESYREL ) 100 MG tablet Take 100 mg by mouth at bedtime.    [provider]  vitamin C  (ASCORBIC ACID ) 500 MG tablet Take 500 mg by mouth daily.    [provider]  zinc  sulfate 220 (50 Zn) MG capsule Take 1 capsule (220 mg total) by mouth daily. 07/07/23   Cheryle Page, MD    Allergies: Cipro  [ciprofloxacin  hcl], Neurontin  [gabapentin ], Levaquin  [levofloxacin ], and Penicillins    Review of Systems  Updated Vital Signs BP (!) 127/58 (BP Location: Right Arm)   Pulse 75   Temp 98 F (36.7 C) (Oral)   Resp 18   Ht 6' (1.829 m)  SpO2 99%   BMI 20.34 kg/m   Physical Exam Constitutional:      Comments: Patient is alert nontoxic.  Mental status clear.  No respiratory distress.  HENT:     Mouth/Throat:     Pharynx: Oropharynx is clear.  Pulmonary:     Effort: Pulmonary effort is normal.  Abdominal:     Comments: Patient has a suprapubic catheter in place with well-established granulation tissue.  No pain to pressure to palpation over the lower abdomen.  With pressure, some clear urine extrudes around the catheter.  No purulence or thick drainage.  Abdominal wall is in good condition without erythema.  Patient also has a colostomy bag in the left lower abdomen.  Neurological:     Comments: Patient is alert with clear mental status.  Pre-existing significant neurologic dysfunction with paraplegia.  Psychiatric:        Mood and Affect: Mood normal.     (all labs ordered are listed, but only abnormal results are displayed) Labs Reviewed - No data to display  EKG: None  Radiology: No results found.   Procedures  Patient a 20 French suprapubic catheter in place.  The 10 mL balloon was completely deflated and the catheter fell out without any resistance.  Clear urine draining from the ostomy.  Area was cleaned with Skin-Prep and  Surgilube applied for insertion of 18 French Foley catheter.  This was passed with no resistance.  10 mL balloon inflated and seated without difficulty.  New drainage bag was attached.  Patient tolerated well. Medications Ordered in the ED - No data to display                                  Medical Decision Making  Patient presents with leakage around his suprapubic catheter.  No other complaints.  Patient does not have pain fever or general illness at this time.  Consult: Reviewed with Dr. Nieves [facility].  Does not recommend increasing the size of the catheter, recommends going with a smaller catheter 18 French to allow ostomy to constrict rather than continue to dilate it.  Suprapubic catheter changed without difficulty as outlined in procedure note.  Patient does not have any other immediate issues.  Will plan for return to SNF.  Patient reports he has follow-up with [facility] on Monday whereupon the catheter can have recheck.  Patient was counseled he may continue to have some urine drainage around the catheter and to try to continue to keep the area dry and manage as per recheck recommendations with urology on Monday.     Final diagnoses:  Suprapubic catheter dysfunction, initial encounter    ED Discharge Orders     None          Armenta Canning, MD 08/24/24 1635

## 2024-08-24 NOTE — ED Notes (Signed)
 Report called to RN at [facility]

## 2024-08-24 NOTE — Discharge Instructions (Signed)
 1.  Your suprapubic catheter was changed from a 20 French to an 26 French catheter.  This is per the instructions of Dr. Nieves with [facility] with whom the matter was reviewed.  You may continue to see leakage around the catheter but the goal is to not expand the opening and allow it to heal to the smaller size.  You need to keep your follow-up appointment with [facility] on Monday to have this rechecked, and see how you are managing with it and if another change is needed.

## 2024-08-24 NOTE — ED Notes (Signed)
 PTAR called

## 2024-09-17 ENCOUNTER — Other Ambulatory Visit: Payer: Self-pay

## 2024-09-17 ENCOUNTER — Emergency Department (HOSPITAL_COMMUNITY)
Admission: EM | Admit: 2024-09-17 | Discharge: 2024-09-18 | Disposition: A | Discharge status: Skilled Nursing Facility | Source: Home / Self Care | Attending: Emergency Medicine | Admitting: Emergency Medicine

## 2024-09-17 DIAGNOSIS — Z794 Long term (current) use of insulin: Secondary | ICD-10-CM | POA: Diagnosis not present

## 2024-09-17 DIAGNOSIS — E119 Type 2 diabetes mellitus without complications: Secondary | ICD-10-CM | POA: Insufficient documentation

## 2024-09-17 DIAGNOSIS — N390 Urinary tract infection, site not specified: Secondary | ICD-10-CM

## 2024-09-17 DIAGNOSIS — I2510 Atherosclerotic heart disease of native coronary artery without angina pectoris: Secondary | ICD-10-CM | POA: Insufficient documentation

## 2024-09-17 DIAGNOSIS — Z7984 Long term (current) use of oral hypoglycemic drugs: Secondary | ICD-10-CM | POA: Diagnosis not present

## 2024-09-17 DIAGNOSIS — Z466 Encounter for fitting and adjustment of urinary device: Secondary | ICD-10-CM | POA: Insufficient documentation

## 2024-09-17 LAB — URINALYSIS, ROUTINE W REFLEX MICROSCOPIC
Bilirubin Urine: NEGATIVE
Glucose, UA: NEGATIVE mg/dL
Ketones, ur: NEGATIVE mg/dL
Nitrite: NEGATIVE
Protein, ur: 100 mg/dL — AB
RBC / HPF: >50 RBC/hpf (ref 0–5)
Specific Gravity, Urine: 1.015 (ref 1.005–1.030)
WBC, UA: >50 WBC/hpf (ref 0–5)
pH: 5 (ref 5.0–8.0)

## 2024-09-17 LAB — CBC WITH DIFFERENTIAL/PLATELET
Abs Immature Granulocytes: 0.04 K/uL (ref 0.00–0.07)
Basophils Absolute: 0.1 K/uL (ref 0.0–0.1)
Basophils Relative: 0 %
Eosinophils Absolute: 0.2 K/uL (ref 0.0–0.5)
Eosinophils Relative: 1 %
HCT: 31.5 % — ABNORMAL LOW (ref 39.0–52.0)
Hemoglobin: 9.1 g/dL — ABNORMAL LOW (ref 13.0–17.0)
Immature Granulocytes: 0 %
Lymphocytes Relative: 22 %
Lymphs Abs: 2.6 K/uL (ref 0.7–4.0)
MCH: 24.2 pg — ABNORMAL LOW (ref 26.0–34.0)
MCHC: 28.9 g/dL — ABNORMAL LOW (ref 30.0–36.0)
MCV: 83.8 fL (ref 80.0–100.0)
Monocytes Absolute: 0.8 K/uL (ref 0.1–1.0)
Monocytes Relative: 7 %
Neutro Abs: 8 K/uL — ABNORMAL HIGH (ref 1.7–7.7)
Neutrophils Relative %: 70 %
Platelets: 339 K/uL (ref 150–400)
RBC: 3.76 MIL/uL — ABNORMAL LOW (ref 4.22–5.81)
RDW: 16.9 % — ABNORMAL HIGH (ref 11.5–15.5)
WBC: 11.7 K/uL — ABNORMAL HIGH (ref 4.0–10.5)
nRBC: 0 % (ref 0.0–0.2)

## 2024-09-17 LAB — BASIC METABOLIC PANEL WITH GFR
Anion gap: 9 (ref 5–15)
BUN: 16 mg/dL (ref 8–23)
CO2: 24 mmol/L (ref 22–32)
Calcium: 9.2 mg/dL (ref 8.9–10.3)
Chloride: 101 mmol/L (ref 98–111)
Creatinine, Ser: 0.61 mg/dL (ref 0.61–1.24)
GFR, Estimated: >60 mL/min
Glucose, Bld: 149 mg/dL — ABNORMAL HIGH (ref 70–99)
Potassium: 5 mmol/L (ref 3.5–5.1)
Sodium: 134 mmol/L — ABNORMAL LOW (ref 135–145)

## 2024-09-17 NOTE — ED Provider Notes (Signed)
" °  Physical Exam  BP (!) 104/46 (BP Location: Right Arm)   Pulse 66   Temp 99.4 F (37.4 C) (Oral)   Resp 16   SpO2 97%   Physical Exam  Procedures  Procedures  ED Course / MDM    Medical Decision Making Amount and/or Complexity of Data Reviewed Labs: ordered.   Patient care assumed at shift handoff from previous provider.  Please see her note for full details.  In short, patient is an 78 year old male who presented with concerns about leakage around his suprapubic catheter.  Catheter was exchanged.  Urine noted in the catheter was purulent.  Plan to check all labs including UA to evaluate for need for antibiotics.   Mild leukocytosis with a white count of 11,700.  UA turbid in appearance with moderate hemoglobin, moderate leukocytes, many bacteria.  Plan to treat with course of Bactrim .  At this time I see no indication for admission or further emergent workup.  Patient stable for discharge back to his facility.    Brent [name] Brent [name] 09/18/24 RED Midge Golas, MD 09/18/24 0130  "

## 2024-09-17 NOTE — ED Triage Notes (Addendum)
 Patient BIBA coming from [facility], leaking suprapubic cath with dark blood in urine, hx of cath issues, no fevers, a/o x 4. Patient is alert and oriented x 4. Airway patent, respirations even and unlabored. Skin normal, warm and dry.

## 2024-09-17 NOTE — ED Provider Notes (Signed)
 " [place] [facility] AT [facility] Provider Note   CSN: [id number] Arrival date & time: 09/17/24  1630     Patient presents with: Hematuria   Brent Winthrop. is a 78 y.o. male.   78 year old male presents emergency room from facility with concern for leakage around his suprapubic catheter.  He is otherwise feeling well, no vomiting or fevers.       Prior to Admission medications  Medication Sig Start Date End Date Taking? Authorizing Provider  acetaminophen  (TYLENOL ) 500 MG tablet Take 2 tablets (1,000 mg total) by mouth every 6 (six) hours as needed for mild pain (or Fever >/= 101). 05/31/23   Samtani, Jai-Gurmukh, MD  albuterol  (ACCUNEB ) 1.25 MG/3ML nebulizer solution Take 1 ampule by nebulization 2 (two) times daily as needed (for coughing).    [provider]  ARTIFICIAL TEARS 1 % ophthalmic solution Place 1 drop into both eyes in the morning.    [provider]  aspirin -acetaminophen -caffeine (EXCEDRIN MIGRAINE) 250-250-65 MG tablet Take 2 tablets by mouth every 12 (twelve) hours as needed (for headaches).    [provider]  atenolol  (TENORMIN ) 25 MG tablet Take 12.5 mg by mouth in the morning.    [provider]  atorvastatin  (LIPITOR) 40 MG tablet Take 1 tablet (40 mg total) by mouth at bedtime. 05/31/23   Samtani, Jai-Gurmukh, MD  azelastine  (ASTELIN ) 0.1 % nasal spray Place 2 sprays into both nostrils in the morning.    [provider]  baclofen  5 MG TABS Take 1 tablet (5 mg total) by mouth 3 (three) times daily. 08/08/23   Raenelle Coria, MD  cholecalciferol  (VITAMIN D3) 25 MCG (1000 UNIT) tablet Take 2,000 Units by mouth daily.    [provider]  ciclopirox (PENLAC) 8 % solution Apply 1 application  topically See admin instructions. Apply over affected nails and surrounding skin. Apply daily over previous coat. After seven (7) days, may remove with alcohol  and continue cycle.    [provider]  DULoxetine  (CYMBALTA ) 30 MG capsule Take 1 capsule (30 mg total) by mouth at bedtime. 05/31/23   Samtani, Jai-Gurmukh, MD  famotidine  (PEPCID ) 20 MG tablet Take 20 mg by mouth 2 (two) times daily.    [provider]  feeding supplement, GLUCERNA SHAKE, (GLUCERNA SHAKE) LIQD Take 237 mLs by mouth 3 (three) times daily between meals. 06/24/24   Pokhrel, Vernal, MD  ferrous sulfate  325 (65 FE) MG tablet Take 1 tablet (325 mg total) by mouth every other day. Patient taking differently: Take 325 mg by mouth daily with breakfast. 07/27/23   Odell Celinda Balo, MD  fluticasone  (FLONASE ) 50 MCG/ACT nasal spray Place 1 spray into both nostrils in the morning and at bedtime.    [provider]  furosemide  (LASIX ) 20 MG tablet Take 20 mg by mouth in the morning.    [provider]  guaiFENesin  (MUCINEX ) 600 MG 12 hr tablet Take 600 mg by mouth 2 (two) times daily.    [provider]  guaiFENesin -dextromethorphan (ROBITUSSIN DM) 100-10 MG/5ML syrup Take 10 mLs by mouth every 6 (six) hours as needed for cough.    [provider]  insulin  aspart (NOVOLOG ) 100 UNIT/ML injection Inject 0-15 Units into the skin every 4 (four) hours. Sliding scale  CBG 70 - 120: 0 units: CBG 121 - 150: 2 units; CBG 151 - 200: 3 units; CBG 201 - 250: 5 units; CBG 251 - 300: 8 units;CBG 301 - 350: 11  units; CBG 351 - 400: 15 units; CBG > 400 : 15 units and notify MD Patient taking differently: Inject 6 Units into the skin 3 (three) times daily with meals. 06/10/20   Rai, Nydia POUR, MD  insulin  glargine-yfgn (SEMGLEE , YFGN,) 100 UNIT/ML Pen Inject 24 Units into the skin 2 (two) times daily. Patient taking differently: Inject 26 Units into the skin 2 (two) times daily. 07/27/23   Odell Celinda Balo, MD  ipratropium-albuterol  (DUONEB) 0.5-2.5 (3) MG/3ML SOLN Take 3 mLs by nebulization every 4 (four) hours as needed (as directed, prescriber).    [provider]   lamoTRIgine  (LAMICTAL ) 25 MG tablet Take 50 mg by mouth 2 (two) times daily.    [provider]  loratadine  (CLARITIN ) 10 MG tablet Take 10 mg by mouth daily.    [provider]  Menthol , Topical Analgesic, (BIOFREEZE) 4 % GEL Apply 1 application  topically 2 (two) times daily as needed (pain to back (upper and lower),arms and shoulders).    [provider]  metFORMIN  (GLUCOPHAGE -XR) 500 MG 24 hr tablet Take 500 mg by mouth 2 (two) times daily before a meal.    [provider]  ondansetron  (ZOFRAN -ODT) 4 MG disintegrating tablet Take 4 mg by mouth every 8 (eight) hours as needed for nausea or vomiting (dissolve orally).    [provider]  oxyCODONE  (OXY IR/ROXICODONE ) 5 MG immediate release tablet Take 1 tablet (5 mg total) by mouth every 6 (six) hours as needed for severe pain (pain score 7-10) or moderate pain (pain score 4-6) (for left shoulder pain). 06/24/24 06/24/25  Pokhrel, Laxman, MD  PATADAY  0.1 % ophthalmic solution Place 1 drop into both eyes 2 (two) times daily.    [provider]  phenol (CHLORASEPTIC) 1.4 % LIQD Use as directed 1 spray in the mouth or throat as needed for throat irritation / pain.    [provider]  polyethylene glycol (MIRALAX  / GLYCOLAX ) 17 g packet Take 17 g by mouth daily as needed for mild constipation or moderate constipation. 06/24/24   Pokhrel, Vernal, MD  potassium chloride  (KLOR-CON ) 10 MEQ tablet Take 10 mEq by mouth daily.    [provider]  riTUXimab  (RITUXAN  IV) Inject 10 mg into the vein once. Between the 12th-13th of June, December.    [provider]  senna-docusate (SENOKOT-S) 8.6-50 MG tablet Take 1 tablet by mouth 2 (two) times daily.    [provider]  traZODone  (DESYREL ) 100 MG tablet Take 100 mg by mouth at bedtime.    [provider]  vitamin C  (ASCORBIC ACID ) 500 MG tablet Take 500 mg by mouth daily.    [provider]  zinc  sulfate 220  (50 Zn) MG capsule Take 1 capsule (220 mg total) by mouth daily. 07/07/23   Cheryle Page, MD    Allergies: Cipro  [ciprofloxacin  hcl], Neurontin  [gabapentin ], Levaquin  [levofloxacin ], and Penicillins    Review of Systems Negative except as per HPI Updated Vital Signs There were no vitals taken for this visit.  Physical Exam Vitals and nursing note reviewed.  Constitutional:      General: He is not in acute distress.    Appearance: He is well-developed. He is not diaphoretic.  HENT:     Head: Normocephalic and atraumatic.  Pulmonary:     Effort: Pulmonary effort is normal.  Abdominal:     Palpations: Abdomen is soft.     Tenderness: There is no abdominal tenderness.     Comments: Colostomy bag with  brown stool output Suprapubic catheter in place, cloudy urine   Neurological:     Mental Status: He is alert and oriented to person, place, and time.  Psychiatric:        Behavior: Behavior normal.     (all labs ordered are listed, but only abnormal results are displayed) Labs Reviewed  CBC WITH DIFFERENTIAL/PLATELET  BASIC METABOLIC PANEL WITH GFR  URINALYSIS, ROUTINE W REFLEX MICROSCOPIC    EKG: None  Radiology: No results found.   Procedures   Suprapubic catheter exchanged without difficulty at bedside. Purulent urine present 19F   Medications Ordered in the ED - No data to display                                  Medical Decision Making Amount and/or Complexity of Data Reviewed Labs: ordered.   This patient presents to the ED for concern of suprapubic catheter leaking, this involves an extensive number of treatment options, and is a complaint that carries with it a high risk of complications and morbidity.  The differential diagnosis includes but not limited to catheter obstruction, UTI   Co morbidities / Chronic conditions that complicate the patient evaluation  DM, HLD, MI, quadriplegia, CAD, additional history as reviewed in chart    Additional  history obtained:  Additional history obtained from EMR External records from outside source obtained and reviewed including last culture on file dated 06/24/2024 with greater than 100,000 colonies Proteus   Lab Tests:  I Ordered, and personally interpreted labs.  The pertinent results include: CBC, BMP, UA are pending at time of signout to oncoming provider   Problem List / ED Course / Critical interventions / Medication management  78 year old male presents from facility with concern for leaking from catheter site.  Urine present in the catheter is purulent which he states is not typical for him.  He denies abdominal pain, fever, vomiting or other concerns.  Colostomy with soft stool output.  Suprapubic catheter was exchanged at bedside without difficulty.  Plan is to check labs, send urine.  Care signed out at change of shift pending vitals, labs. I have reviewed the patients home medicines and have made adjustments as needed   Social Determinants of Health:  Lives at facility   Test / Admission - Considered:  Disposition pending at time of signout to oncoming provider      Final diagnoses:  Indwelling catheter replaced    ED Discharge Orders     None          Brent [name] 09/17/24 2141  "

## 2024-09-18 MED ORDER — SULFAMETHOXAZOLE-TRIMETHOPRIM 800-160 MG PO TABS: 1.0000 | ORAL_TABLET | Freq: Two times a day (BID) | ORAL | 0 refills | Status: AC

## 2024-09-18 NOTE — ED Notes (Signed)
 [facility] called for transport.

## 2024-09-18 NOTE — ED Notes (Signed)
 Attempted to call report to [facility]. No answer at nursing station.

## 2024-09-18 NOTE — ED Notes (Signed)
 Multiple attempts made to give report to [facility].  No answer.  All paperwork sent with patient

## 2024-09-18 NOTE — ED Notes (Signed)
 Patient reported his blood sugar was dropping per personal glucose meter.  Given orange juice per request

## 2024-09-18 NOTE — Discharge Instructions (Signed)
 Your urine was concerning for signs of infection.  A urine culture has been sent for further analysis.  A prescription has been provided for an antibiotic.  Please take as directed.  Follow-up with your primary care team as needed.  Return to the emergency department if you develop any life-threatening symptoms.

## 2024-09-19 LAB — URINE CULTURE

## 2024-10-17 ENCOUNTER — Encounter (HOSPITAL_COMMUNITY): Payer: Self-pay

## 2024-10-17 ENCOUNTER — Inpatient Hospital Stay (HOSPITAL_COMMUNITY)
Admission: EM | Admit: 2024-10-17 | Discharge: 2024-10-21 | DRG: 313 | Disposition: A | Discharge status: Skilled Nursing Facility | Source: Skilled Nursing Facility | Attending: Internal Medicine | Admitting: Internal Medicine

## 2024-10-17 ENCOUNTER — Emergency Department (HOSPITAL_COMMUNITY)

## 2024-10-17 ENCOUNTER — Other Ambulatory Visit: Payer: Self-pay

## 2024-10-17 DIAGNOSIS — G8929 Other chronic pain: Secondary | ICD-10-CM | POA: Diagnosis present

## 2024-10-17 DIAGNOSIS — L98423 Non-pressure chronic ulcer of back with necrosis of muscle: Secondary | ICD-10-CM | POA: Diagnosis present

## 2024-10-17 DIAGNOSIS — Z794 Long term (current) use of insulin: Secondary | ICD-10-CM

## 2024-10-17 DIAGNOSIS — R079 Chest pain, unspecified: Secondary | ICD-10-CM | POA: Diagnosis not present

## 2024-10-17 DIAGNOSIS — I700 Atherosclerosis of aorta: Secondary | ICD-10-CM | POA: Diagnosis present

## 2024-10-17 DIAGNOSIS — Z9049 Acquired absence of other specified parts of digestive tract: Secondary | ICD-10-CM

## 2024-10-17 DIAGNOSIS — Z993 Dependence on wheelchair: Secondary | ICD-10-CM

## 2024-10-17 DIAGNOSIS — E781 Pure hyperglyceridemia: Secondary | ICD-10-CM | POA: Diagnosis present

## 2024-10-17 DIAGNOSIS — I10 Essential (primary) hypertension: Secondary | ICD-10-CM | POA: Diagnosis present

## 2024-10-17 DIAGNOSIS — R0789 Other chest pain: Principal | ICD-10-CM | POA: Diagnosis present

## 2024-10-17 DIAGNOSIS — I110 Hypertensive heart disease with heart failure: Secondary | ICD-10-CM | POA: Diagnosis present

## 2024-10-17 DIAGNOSIS — Z7982 Long term (current) use of aspirin: Secondary | ICD-10-CM

## 2024-10-17 DIAGNOSIS — D509 Iron deficiency anemia, unspecified: Secondary | ICD-10-CM | POA: Diagnosis present

## 2024-10-17 DIAGNOSIS — I5033 Acute on chronic diastolic (congestive) heart failure: Secondary | ICD-10-CM | POA: Diagnosis present

## 2024-10-17 DIAGNOSIS — Z955 Presence of coronary angioplasty implant and graft: Secondary | ICD-10-CM

## 2024-10-17 DIAGNOSIS — N390 Urinary tract infection, site not specified: Secondary | ICD-10-CM | POA: Diagnosis present

## 2024-10-17 DIAGNOSIS — D649 Anemia, unspecified: Secondary | ICD-10-CM | POA: Insufficient documentation

## 2024-10-17 DIAGNOSIS — Z880 Allergy status to penicillin: Secondary | ICD-10-CM

## 2024-10-17 DIAGNOSIS — Z8249 Family history of ischemic heart disease and other diseases of the circulatory system: Secondary | ICD-10-CM

## 2024-10-17 DIAGNOSIS — G360 Neuromyelitis optica [Devic]: Secondary | ICD-10-CM | POA: Diagnosis present

## 2024-10-17 DIAGNOSIS — D3A8 Other benign neuroendocrine tumors: Secondary | ICD-10-CM | POA: Diagnosis present

## 2024-10-17 DIAGNOSIS — Z888 Allergy status to other drugs, medicaments and biological substances status: Secondary | ICD-10-CM

## 2024-10-17 DIAGNOSIS — Z7984 Long term (current) use of oral hypoglycemic drugs: Secondary | ICD-10-CM

## 2024-10-17 DIAGNOSIS — Z881 Allergy status to other antibiotic agents status: Secondary | ICD-10-CM

## 2024-10-17 DIAGNOSIS — G4733 Obstructive sleep apnea (adult) (pediatric): Secondary | ICD-10-CM | POA: Diagnosis present

## 2024-10-17 DIAGNOSIS — K219 Gastro-esophageal reflux disease without esophagitis: Secondary | ICD-10-CM | POA: Diagnosis present

## 2024-10-17 DIAGNOSIS — I2510 Atherosclerotic heart disease of native coronary artery without angina pectoris: Secondary | ICD-10-CM | POA: Diagnosis present

## 2024-10-17 DIAGNOSIS — E1142 Type 2 diabetes mellitus with diabetic polyneuropathy: Secondary | ICD-10-CM | POA: Diagnosis present

## 2024-10-17 DIAGNOSIS — I252 Old myocardial infarction: Secondary | ICD-10-CM

## 2024-10-17 DIAGNOSIS — L89154 Pressure ulcer of sacral region, stage 4: Secondary | ICD-10-CM | POA: Diagnosis present

## 2024-10-17 DIAGNOSIS — E1165 Type 2 diabetes mellitus with hyperglycemia: Secondary | ICD-10-CM | POA: Diagnosis not present

## 2024-10-17 DIAGNOSIS — Z89421 Acquired absence of other right toe(s): Secondary | ICD-10-CM

## 2024-10-17 DIAGNOSIS — E785 Hyperlipidemia, unspecified: Secondary | ICD-10-CM | POA: Diagnosis present

## 2024-10-17 DIAGNOSIS — Z85820 Personal history of malignant melanoma of skin: Secondary | ICD-10-CM

## 2024-10-17 DIAGNOSIS — F32A Depression, unspecified: Secondary | ICD-10-CM | POA: Diagnosis present

## 2024-10-17 DIAGNOSIS — Z933 Colostomy status: Secondary | ICD-10-CM

## 2024-10-17 DIAGNOSIS — G8250 Quadriplegia, unspecified: Secondary | ICD-10-CM | POA: Diagnosis present

## 2024-10-17 DIAGNOSIS — G8220 Paraplegia, unspecified: Secondary | ICD-10-CM | POA: Diagnosis present

## 2024-10-17 DIAGNOSIS — Z604 Social exclusion and rejection: Secondary | ICD-10-CM | POA: Diagnosis present

## 2024-10-17 DIAGNOSIS — Z87891 Personal history of nicotine dependence: Secondary | ICD-10-CM

## 2024-10-17 DIAGNOSIS — Z79899 Other long term (current) drug therapy: Secondary | ICD-10-CM

## 2024-10-17 DIAGNOSIS — N319 Neuromuscular dysfunction of bladder, unspecified: Secondary | ICD-10-CM | POA: Diagnosis present

## 2024-10-17 LAB — CBC
HCT: 27.3 % — ABNORMAL LOW (ref 39.0–52.0)
Hemoglobin: 8.3 g/dL — ABNORMAL LOW (ref 13.0–17.0)
MCH: 24.9 pg — ABNORMAL LOW (ref 26.0–34.0)
MCHC: 30.4 g/dL (ref 30.0–36.0)
MCV: 81.7 fL (ref 80.0–100.0)
Platelets: 261 10*3/uL (ref 150–400)
RBC: 3.34 MIL/uL — ABNORMAL LOW (ref 4.22–5.81)
RDW: 17.7 % — ABNORMAL HIGH (ref 11.5–15.5)
WBC: 11.4 10*3/uL — ABNORMAL HIGH (ref 4.0–10.5)
nRBC: 0 % (ref 0.0–0.2)

## 2024-10-17 LAB — TROPONIN T, HIGH SENSITIVITY
Troponin T High Sensitivity: 68 ng/L — ABNORMAL HIGH (ref 0–19)
Troponin T High Sensitivity: 70 ng/L — ABNORMAL HIGH (ref 0–19)

## 2024-10-17 LAB — BASIC METABOLIC PANEL WITH GFR
Anion gap: 11 (ref 5–15)
BUN: 18 mg/dL (ref 8–23)
CO2: 23 mmol/L (ref 22–32)
Calcium: 8.4 mg/dL — ABNORMAL LOW (ref 8.9–10.3)
Chloride: 101 mmol/L (ref 98–111)
Creatinine, Ser: 0.59 mg/dL — ABNORMAL LOW (ref 0.61–1.24)
GFR, Estimated: >60 mL/min
Glucose, Bld: 147 mg/dL — ABNORMAL HIGH (ref 70–99)
Potassium: 4.5 mmol/L (ref 3.5–5.1)
Sodium: 135 mmol/L (ref 135–145)

## 2024-10-17 LAB — PRO BRAIN NATRIURETIC PEPTIDE: Pro Brain Natriuretic Peptide: 455 pg/mL — ABNORMAL HIGH

## 2024-10-17 MED ORDER — ZINC SULFATE 220 (50 ZN) MG PO CAPS
220.0000 mg | ORAL_CAPSULE | Freq: Every day | ORAL | Status: DC
  Administered 2024-10-18 – 2024-10-21 (×4): 220 mg via ORAL
  Filled 2024-10-17 (×4): qty 1

## 2024-10-17 MED ORDER — ATORVASTATIN CALCIUM 40 MG PO TABS
40.0000 mg | ORAL_TABLET | Freq: Every day | ORAL | Status: DC
  Administered 2024-10-18 – 2024-10-20 (×3): 40 mg via ORAL
  Filled 2024-10-17 (×3): qty 1

## 2024-10-17 MED ORDER — ENOXAPARIN SODIUM 40 MG/0.4ML IJ SOSY
40.0000 mg | PREFILLED_SYRINGE | INTRAMUSCULAR | Status: DC
  Administered 2024-10-18 – 2024-10-21 (×4): 40 mg via SUBCUTANEOUS
  Filled 2024-10-17 (×4): qty 0.4

## 2024-10-17 MED ORDER — OLOPATADINE HCL 0.1 % OP SOLN
1.0000 [drp] | Freq: Two times a day (BID) | OPHTHALMIC | Status: AC
  Administered 2024-10-18 – 2024-10-21 (×6): 1 [drp] via OPHTHALMIC
  Filled 2024-10-17: qty 5

## 2024-10-17 MED ORDER — OXYCODONE HCL 5 MG PO TABS
5.0000 mg | ORAL_TABLET | Freq: Four times a day (QID) | ORAL | Status: DC | PRN
  Administered 2024-10-17 – 2024-10-21 (×5): 5 mg via ORAL
  Filled 2024-10-17 (×5): qty 1

## 2024-10-17 MED ORDER — LAMOTRIGINE 100 MG PO TABS
50.0000 mg | ORAL_TABLET | Freq: Two times a day (BID) | ORAL | Status: DC
  Administered 2024-10-18 – 2024-10-21 (×8): 50 mg via ORAL
  Filled 2024-10-17 (×8): qty 1

## 2024-10-17 MED ORDER — INSULIN ASPART 100 UNIT/ML IJ SOLN
0.0000 [IU] | Freq: Three times a day (TID) | INTRAMUSCULAR | Status: DC
  Administered 2024-10-18: 2 [IU] via SUBCUTANEOUS
  Administered 2024-10-18: 1 [IU] via SUBCUTANEOUS
  Administered 2024-10-18: 2 [IU] via SUBCUTANEOUS
  Administered 2024-10-19 (×3): 1 [IU] via SUBCUTANEOUS
  Administered 2024-10-20 (×2): 3 [IU] via SUBCUTANEOUS
  Administered 2024-10-21: 2 [IU] via SUBCUTANEOUS
  Filled 2024-10-17: qty 1
  Filled 2024-10-17: qty 2
  Filled 2024-10-17: qty 3
  Filled 2024-10-17 (×2): qty 2
  Filled 2024-10-17 (×2): qty 1
  Filled 2024-10-17: qty 2

## 2024-10-17 MED ORDER — SENNOSIDES-DOCUSATE SODIUM 8.6-50 MG PO TABS
1.0000 | ORAL_TABLET | Freq: Two times a day (BID) | ORAL | Status: DC
  Administered 2024-10-18 – 2024-10-21 (×7): 1 via ORAL
  Filled 2024-10-17 (×7): qty 1

## 2024-10-17 MED ORDER — FAMOTIDINE 20 MG PO TABS
20.0000 mg | ORAL_TABLET | Freq: Two times a day (BID) | ORAL | Status: DC
  Administered 2024-10-18 – 2024-10-21 (×7): 20 mg via ORAL
  Filled 2024-10-17 (×7): qty 1

## 2024-10-17 MED ORDER — FUROSEMIDE 10 MG/ML IJ SOLN
20.0000 mg | Freq: Once | INTRAMUSCULAR | Status: AC
  Administered 2024-10-17: 20 mg via INTRAVENOUS
  Filled 2024-10-17: qty 2

## 2024-10-17 MED ORDER — POLYETHYLENE GLYCOL 3350 17 G PO PACK: 17.0000 g | PACK | Freq: Every day | ORAL | Status: DC | PRN

## 2024-10-17 MED ORDER — CARBOXYMETHYLCELLULOSE SODIUM 1 % OP SOLN: 1.0000 [drp] | Freq: Every day | OPHTHALMIC | Status: DC

## 2024-10-17 MED ORDER — INSULIN GLARGINE 100 UNIT/ML ~~LOC~~ SOLN
24.0000 [IU] | Freq: Two times a day (BID) | SUBCUTANEOUS | Status: DC
  Administered 2024-10-18 – 2024-10-21 (×7): 24 [IU] via SUBCUTANEOUS
  Filled 2024-10-17 (×9): qty 0.24

## 2024-10-17 MED ORDER — FUROSEMIDE 20 MG PO TABS
20.0000 mg | ORAL_TABLET | Freq: Every day | ORAL | Status: DC
  Administered 2024-10-18 – 2024-10-21 (×4): 20 mg via ORAL
  Filled 2024-10-17 (×4): qty 1

## 2024-10-17 MED ORDER — VITAMIN D 25 MCG (1000 UNIT) PO TABS
2000.0000 [IU] | ORAL_TABLET | Freq: Every day | ORAL | Status: DC
  Administered 2024-10-18 – 2024-10-21 (×4): 2000 [IU] via ORAL
  Filled 2024-10-17 (×4): qty 2

## 2024-10-17 MED ORDER — POTASSIUM CHLORIDE CRYS ER 10 MEQ PO TBCR
10.0000 meq | EXTENDED_RELEASE_TABLET | Freq: Every day | ORAL | Status: DC
  Administered 2024-10-18 – 2024-10-21 (×4): 10 meq via ORAL
  Filled 2024-10-17 (×4): qty 1

## 2024-10-17 MED ORDER — THIAMINE MONONITRATE 100 MG PO TABS
100.0000 mg | ORAL_TABLET | Freq: Every day | ORAL | Status: DC
  Administered 2024-10-18 – 2024-10-21 (×4): 100 mg via ORAL
  Filled 2024-10-17 (×4): qty 1

## 2024-10-17 MED ORDER — ATENOLOL 25 MG PO TABS
12.5000 mg | ORAL_TABLET | Freq: Every morning | ORAL | Status: DC
  Administered 2024-10-18 – 2024-10-21 (×3): 12.5 mg via ORAL
  Filled 2024-10-17 (×4): qty 1

## 2024-10-17 MED ORDER — DULOXETINE HCL 30 MG PO CPEP
30.0000 mg | ORAL_CAPSULE | Freq: Every day | ORAL | Status: DC
  Administered 2024-10-18 – 2024-10-20 (×4): 30 mg via ORAL
  Filled 2024-10-17 (×5): qty 1

## 2024-10-17 MED ORDER — RAMELTEON 8 MG PO TABS
8.0000 mg | ORAL_TABLET | Freq: Every day | ORAL | Status: DC
  Administered 2024-10-18 – 2024-10-20 (×4): 8 mg via ORAL
  Filled 2024-10-17 (×4): qty 1

## 2024-10-17 MED ORDER — SODIUM CHLORIDE 0.9 % IV BOLUS: 500.0000 mL | Freq: Once | INTRAVENOUS | Status: DC

## 2024-10-17 MED ORDER — TRAZODONE HCL 100 MG PO TABS
100.0000 mg | ORAL_TABLET | Freq: Every day | ORAL | Status: DC
  Administered 2024-10-18 – 2024-10-20 (×4): 100 mg via ORAL
  Filled 2024-10-17: qty 1
  Filled 2024-10-17: qty 2
  Filled 2024-10-17 (×3): qty 1

## 2024-10-17 MED ORDER — BACLOFEN 10 MG PO TABS
10.0000 mg | ORAL_TABLET | Freq: Three times a day (TID) | ORAL | Status: DC
  Administered 2024-10-18 – 2024-10-21 (×11): 10 mg via ORAL
  Filled 2024-10-17 (×11): qty 1

## 2024-10-17 MED ORDER — VITAMIN C 500 MG PO TABS
500.0000 mg | ORAL_TABLET | Freq: Every day | ORAL | Status: DC
  Administered 2024-10-18 – 2024-10-21 (×4): 500 mg via ORAL
  Filled 2024-10-17 (×4): qty 1

## 2024-10-17 NOTE — H&P (Signed)
 " History and Physical    Brent [name]. FMW:[id number] DOB: [birth year] DOA: 10/17/2024  Patient coming from: Skilled nursing facility.  Chief Complaint: Chest pain.  HPI: Brent [name]. is a 79 y.o. 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  Patient states he started having sudden onset of retrosternal chest pressure with radiation to left arm with some shortness of breath.  When he took sublingual nitroglycerin  the chest pain resolved.  ED Course: In the ER EKG was unremarkable.  Troponins were 60 and 70.  proBNP was 455.  Chest x-ray was showing mild pleural effusion and pulm edema Lasix  20 mg IV was given.  Cardiology was consulted.  Patient admitted for further management.  Patient's lab work show hemoglobin of 8.3 platelets 261 creatinine 0.5.  Blood glucose 147. Review of Systems: As per HPI, rest all negative.   Past Medical History:  Diagnosis Date   Acute osteomyelitis of metatarsal bone of right foot (HCC) 07/01/2023   Allergy     Aortic atherosclerosis 07/01/2023   Benign neuroendocrine tumor of stomach (HCC) 04/04/2022   CAD (coronary artery disease)    a. angioplasty of his RCA in 1990. b. bare metal stent placed in the RCA in 2000 followed by rotational atherectomy shortly after for stent restenosis. c. last cath was in 2012 showing stable moderate diffuse CAD. (70% mid LAD, 80% diagonal, 70% Ramus, 40% mid to distal RCA stent restenosis). d. Low risk nuc in 2015.   Diabetes mellitus    Diverticulosis    Elevated CK    Erectile dysfunction    Hemorrhoids    HTN (hypertension)    Hyperlipidemia    Hypertriglyceridemia    Malignant  melanoma of left side of neck (HCC) 10/25/2018   Myocardial infarction (HCC)    Obesity    OSA (obstructive sleep apnea)    Persistent disorder of initiating or maintaining sleep    Personal history of colonic polyps 02/05/2003   Renal lesion 06/20/2016   Septic arthritis of foot (HCC) 07/01/2023   Stage IV decubitus ulcer (HCC) 06/30/2023    Past Surgical History:  Procedure Laterality Date   AMPUTATION TOE Right 07/02/2023   Procedure: AMPUTATION TOE FIFTH RAY;  Surgeon: Barton Drape, MD;  Location: [facility];  Service: Orthopedics;  Laterality: Right;   BIOPSY  03/26/2022   Procedure: BIOPSY;  Surgeon: Avram Lupita BRAVO, MD;  Location: [facility] ENDOSCOPY;  Service: Gastroenterology;;   CHOLECYSTECTOMY     CORONARY ANGIOPLASTY     CORONARY STENT PLACEMENT     stenting of the right coronary artery with followup rotational  atherectomy. (3 stents placed)   ENDOSCOPIC MUCOSAL RESECTION  10/13/2022   Procedure: ENDOSCOPIC MUCOSAL RESECTION;  Surgeon: Wilhelmenia Aloha Mickey., MD;  Location: [facility];  Service: Gastroenterology;;   ESOPHAGOGASTRODUODENOSCOPY (EGD) WITH PROPOFOL  N/A 03/26/2022   Procedure: ESOPHAGOGASTRODUODENOSCOPY (EGD) WITH PROPOFOL ;  Surgeon: Avram Lupita BRAVO, MD;  Location: [facility] ENDOSCOPY;  Service: Gastroenterology;  Laterality: N/A;   ESOPHAGOGASTRODUODENOSCOPY (EGD) WITH PROPOFOL  N/A 07/11/2022   Procedure: ESOPHAGOGASTRODUODENOSCOPY (EGD) WITH PROPOFOL ;  Surgeon: Wilhelmenia Aloha Mickey., MD;  Location: [facility];  Service: Gastroenterology;  Laterality: N/A;   ESOPHAGOGASTRODUODENOSCOPY (EGD) WITH PROPOFOL  N/A 10/13/2022   Procedure: ESOPHAGOGASTRODUODENOSCOPY (EGD)  WITH PROPOFOL ;  Surgeon: Mansouraty, Aloha Raddle., MD;  Location: [facility];  Service: Gastroenterology;  Laterality: N/A;   EUS N/A 10/13/2022   Procedure: UPPER ENDOSCOPIC ULTRASOUND (EUS) RADIAL;  Surgeon: Wilhelmenia Aloha Raddle., MD;  Location: [facility];  Service: Gastroenterology;  Laterality: N/A;    FINGER SURGERY     right   FOOT SURGERY     right   HEMOSTASIS CLIP PLACEMENT  07/11/2022   Procedure: HEMOSTASIS CLIP PLACEMENT;  Surgeon: Wilhelmenia Aloha Raddle., MD;  Location: [facility];  Service: Gastroenterology;;  ovesco    HEMOSTASIS CLIP PLACEMENT  10/13/2022   Procedure: HEMOSTASIS CLIP PLACEMENT;  Surgeon: Wilhelmenia Aloha Raddle., MD;  Location: [facility];  Service: Gastroenterology;;   HOT HEMOSTASIS N/A 07/11/2022   Procedure: HOT HEMOSTASIS (ARGON PLASMA COAGULATION/BICAP);  Surgeon: Wilhelmenia Aloha Raddle., MD;  Location: [facility];  Service: Gastroenterology;  Laterality: N/A;   HOT HEMOSTASIS N/A 10/13/2022   Procedure: HOT HEMOSTASIS (ARGON PLASMA COAGULATION/BICAP);  Surgeon: Wilhelmenia Aloha Raddle., MD;  Location: [facility];  Service: Gastroenterology;  Laterality: N/A;   INCISION AND DRAINAGE ABSCESS Left 06/18/2024   Procedure: INCISIOIN AND DRAINAGE LEFT POSTERIOR THIGH ABSCESS;  Surgeon: Eletha Boas, MD;  Location: [facility];  Service: General;  Laterality: Left;  Left posterior thigh   INGUINAL HERNIA REPAIR     right   IR FLUORO PROCEDURE UNLISTED  02/21/2023   IR GASTROSTOMY TUBE REMOVAL  02/10/2022   IR REPLACE G-TUBE SIMPLE WO FLUORO  03/14/2023   IR REPLC GASTRO/COLONIC TUBE PERCUT W/FLUORO  12/22/2022   IR REPLC GASTRO/COLONIC TUBE PERCUT W/FLUORO  01/26/2023   IR REPLC GASTRO/COLONIC TUBE PERCUT W/FLUORO  03/06/2023   IR REPLC GASTRO/COLONIC TUBE PERCUT W/FLUORO  04/27/2023   IRRIGATION AND DEBRIDEMENT ABSCESS N/A 06/04/2020   Procedure: IRRIGATION AND DEBRIDEMENT SACRAL WOUND;  Surgeon: Paola Dreama SAILOR, MD;  Location: [facility] OR;  Service: General;  Laterality: N/A;   LAPAROSCOPY N/A 06/04/2020   Procedure: LAPAROSCOPY ASSISTED COLOSTOMY, LAPAROSCOPY GASTROSTOMY;  Surgeon: Paola Dreama SAILOR, MD;  Location: [facility] OR;  Service: General;  Laterality: N/A;   LAPAROSCOPY N/A 05/24/2023   Procedure: LAPAROSCOPY DIAGNOSTIC;  Surgeon: Ebbie Cough, MD;  Location: [facility] OR;   Service: General;  Laterality: N/A;   LAPAROTOMY N/A 05/24/2023   Procedure: WEDGED RESECTION OF STOMACH AND GASTROCUTANEOUS FISTULA;  Surgeon: Ebbie Cough, MD;  Location: [facility] OR;  Service: General;  Laterality: N/A;   LYSIS OF ADHESION N/A 06/04/2020   Procedure: LYSIS OF ADHESION;  Surgeon: Paola Dreama SAILOR, MD;  Location: [facility] OR;  Service: General;  Laterality: N/A;   LYSIS OF ADHESION N/A 05/24/2023   Procedure: LYSIS OF ADHESION;  Surgeon: Ebbie Cough, MD;  Location: [facility] OR;  Service: General;  Laterality: N/A;   melanoma removal     neck   ORTHOPEDIC SURGERY     foot right   POLYPECTOMY  10/13/2022   Procedure: POLYPECTOMY;  Surgeon: Wilhelmenia Aloha Raddle., MD;  Location: [facility];  Service: Gastroenterology;;   rotator cuff surg     Bil   STENT REMOVAL  10/13/2022   Procedure: CLIP REMOVAL;  Surgeon: Wilhelmenia Aloha Raddle., MD;  Location: [facility];  Service: Gastroenterology;;   SUBMUCOSAL LIFTING INJECTION  10/13/2022   Procedure: SUBMUCOSAL LIFTING INJECTION;  Surgeon: Wilhelmenia Aloha Raddle., MD;  Location: [facility];  Service: Gastroenterology;;   SUBMUCOSAL TATTOO INJECTION  10/13/2022   Procedure: SUBMUCOSAL TATTOO INJECTION;  Surgeon: Wilhelmenia Aloha Raddle., MD;  Location: [facility];  Service: Gastroenterology;;     reports  that he quit smoking about 44 years ago. His smoking use included cigarettes. He started smoking about 74 years ago. He has a 45 pack-year smoking history. He has never been exposed to tobacco smoke. He has never used smokeless tobacco. He reports that he does not currently use alcohol  after a past usage of about 7.0 - 14.0 standard drinks of alcohol  per week. He reports that he does not use drugs.  Allergies[1]  Family History  Problem Relation Age of Onset   Lung cancer Mother    COPD Mother    Obesity Mother    Liver cancer Father    Lung cancer Father    Heart disease Father    Heart disease Sister    Colon cancer Neg Hx     Stomach cancer Neg Hx    Pancreatic cancer Neg Hx    Esophageal cancer Neg Hx    Inflammatory bowel disease Neg Hx    Liver disease Neg Hx    Rectal cancer Neg Hx     Prior to Admission medications  Medication Sig Start Date End Date Taking? Authorizing Provider  acetaminophen  (TYLENOL ) 500 MG tablet Take 2 tablets (1,000 mg total) by mouth every 6 (six) hours as needed for mild pain (or Fever >/= 101). 05/31/23  Yes Samtani, Jai-Gurmukh, MD  acetic acid 0.25 % irrigation Irrigate with 1 Application as directed 2 (two) times daily. 10 ml   Yes [provider]  albuterol  (ACCUNEB ) 1.25 MG/3ML nebulizer solution Take 1 ampule by nebulization 2 (two) times daily as needed (for coughing).   Yes [provider]  ARTIFICIAL TEARS 1 % ophthalmic solution Place 1 drop into both eyes daily.   Yes [provider]  atenolol  (TENORMIN ) 25 MG tablet Take 12.5 mg by mouth in the morning.   Yes [provider]  atorvastatin  (LIPITOR) 40 MG tablet Take 1 tablet (40 mg total) by mouth at bedtime. 05/31/23  Yes Samtani, Jai-Gurmukh, MD  azelastine  (ASTELIN ) 0.1 % nasal spray Place 2 sprays into both nostrils daily.   Yes [provider]  baclofen  (LIORESAL ) 10 MG tablet Take 10 mg by mouth 3 (three) times daily.   Yes [provider]  calcium  carbonate (TUMS - DOSED IN MG ELEMENTAL CALCIUM ) 500 MG chewable tablet Chew 1,000 mg by mouth every 4 (four) hours as needed for indigestion.   Yes [provider]  cholecalciferol  (VITAMIN D3) 25 MCG (1000 UNIT) tablet Take 2,000 Units by mouth daily.   Yes [provider]  dextromethorphan (DELSYM) 30 MG/5ML liquid Take 60 mg by mouth 2 (two) times daily as needed for cough.   Yes [provider]  DULoxetine  (CYMBALTA ) 30 MG capsule Take 1 capsule (30 mg total) by mouth at bedtime. 05/31/23  Yes Samtani, Jai-Gurmukh, MD  famotidine  (PEPCID ) 20 MG tablet Take 20 mg by mouth 2 (two) times daily.    Yes [provider]  ferrous sulfate  325 (65 FE) MG tablet Take 1 tablet (325 mg total) by mouth every other day. Patient taking differently: Take 325 mg by mouth daily. 07/27/23  Yes Odell Celinda Balo, MD  fluticasone  (FLONASE ) 50 MCG/ACT nasal spray Place 1 spray into both nostrils in the morning and at bedtime.   Yes [provider]  furosemide  (LASIX ) 20 MG tablet Take 20 mg by mouth daily.   Yes [provider]  guaiFENesin  (MUCINEX ) 600 MG 12 hr tablet Take 600 mg by mouth 2 (two) times daily.   Yes [provider]  insulin  glargine-yfgn (SEMGLEE , YFGN,) 100 UNIT/ML Pen Inject 24 Units into the skin 2 (two) times daily. Patient taking differently: Inject 29 Units into the skin 2 (two) times daily. 07/27/23  Yes Odell Celinda Balo, MD  insulin  lispro (HUMALOG) 100 UNIT/ML injection Inject 0-12 Units into the skin 4 (four) times daily - after meals and at bedtime. 70-200 0 units  201-250 2 units  251-300 4 units  301-350 6 units  351-400 8 units  401-450 10 units  451-600 12 units   Yes [provider]  ipratropium-albuterol  (DUONEB) 0.5-2.5 (3) MG/3ML SOLN Take 3 mLs by nebulization every 4 (four) hours as needed (wheezing/sob).   Yes [provider]  lamoTRIgine  (LAMICTAL ) 25 MG tablet Take 50 mg by mouth 2 (two) times daily.   Yes [provider]  loratadine  (CLARITIN ) 10 MG tablet Take 10 mg by mouth daily.   Yes [provider]  Menthol , Topical Analgesic, (BIOFREEZE) 4 % GEL Apply 1 application  topically 2 (two) times daily as needed (pain to back, upper and lower arms, and shoulders).   Yes [provider]  metFORMIN  (GLUCOPHAGE -XR) 500 MG 24 hr tablet Take 500 mg by mouth 2 (two) times daily before a meal.   Yes [provider]  ondansetron  (ZOFRAN -ODT) 4 MG disintegrating tablet Take 4 mg by mouth every 8 (eight) hours as needed for nausea or vomiting.   Yes [provider]   oxyCODONE  (OXY IR/ROXICODONE ) 5 MG immediate release tablet Take 1 tablet (5 mg total) by mouth every 6 (six) hours as needed for severe pain (pain score 7-10) or moderate pain (pain score 4-6) (for left shoulder pain). 06/24/24 06/24/25 Yes Pokhrel, Laxman, MD  PATADAY  0.1 % ophthalmic solution Place 1 drop into both eyes 2 (two) times daily.   Yes [provider]  phenol (CHLORASEPTIC) 1.4 % LIQD Use as directed 2 sprays in the mouth or throat as needed for throat irritation / pain.   Yes [provider]  polyethylene glycol (MIRALAX  / GLYCOLAX ) 17 g packet Take 17 g by mouth daily as needed for mild constipation or moderate constipation. 06/24/24  Yes Pokhrel, Laxman, MD  potassium chloride  (KLOR-CON ) 10 MEQ tablet Take 10 mEq by mouth daily.   Yes [provider]  ramelteon  (ROZEREM ) 8 MG tablet Take 8 mg by mouth at bedtime.   Yes [provider]  riTUXimab  (RITUXAN  IV) Inject 10 mg into the vein once. Between the 12th-13th of June, December.   Yes [provider]  senna-docusate (SENOKOT-S) 8.6-50 MG tablet Take 1 tablet by mouth 2 (two) times daily.   Yes [provider]  thiamine  (VITAMIN B1) 100 MG tablet Take 100 mg by mouth daily.   Yes [provider]  traZODone  (DESYREL ) 100 MG tablet Take 100 mg by mouth at bedtime.   Yes [provider]  vitamin C  (ASCORBIC ACID ) 500 MG tablet Take 500 mg by mouth daily.   Yes [provider]  zinc  sulfate 220 (50 Zn) MG capsule Take 1 capsule (220 mg total) by mouth daily. 07/07/23  Yes Cheryle Page, MD  sulfamethoxazole -trimethoprim  (BACTRIM  DS) 800-160 MG tablet Take 1 tablet by mouth 2 (two) times daily. Patient not taking: Reported on 10/17/2024    [provider]    Physical Exam: Constitutional: Moderately built and nourished. Vitals:   10/17/24 1659 10/17/24 2031 10/17/24 2200 10/17/24 2335  BP: (!) 111/42 (!) 121/41 (!) 116/40   Pulse: 66 70 70  Resp: (!) 21 16 13    Temp: 98.7 F (37.1 C) 98.4 F (36.9 C)  98.1 F (36.7 C)  TempSrc: Oral Oral  Oral  SpO2: 100% 99% 97%   Weight:      Height:       Eyes: Anicteric no pallor. ENMT: No discharge from the ears eyes nose or mouth. Neck: No mass felt.  No neck rigidity. Respiratory: No rhonchi or crepitations. Cardiovascular: S1-S2 heard. Abdomen: Soft nontender bowel sound present.  Colostomy seen. Musculoskeletal: No edema. Skin: Sacral decubitus ulcer stage IV. Neurologic: Alert awake oriented time place and person.  Patient is quadriplegic from neuromyelitis optica. Psychiatric: Appears normal.  Normal affect.   Labs on Admission: I have personally reviewed following labs and imaging studies  CBC: Recent Labs  Lab 10/17/24 1144  WBC 11.4*  HGB 8.3*  HCT 27.3*  MCV 81.7  PLT 261   Basic Metabolic Panel: Recent Labs  Lab 10/17/24 1144  NA 135  K 4.5  CL 101  CO2 23  GLUCOSE 147*  BUN 18  CREATININE 0.59*  CALCIUM  8.4*   GFR: Estimated Creatinine Clearance: 80.8 mL/min (A) (by C-G formula based on SCr of 0.59 mg/dL (L)). Liver Function Tests: No results for input(s): AST, ALT, ALKPHOS, BILITOT, PROT, ALBUMIN  in the last 168 hours. No results for input(s): LIPASE, AMYLASE in the last 168 hours. No results for input(s): AMMONIA in the last 168 hours. Coagulation Profile: No results for input(s): INR, PROTIME in the last 168 hours. Cardiac Enzymes: No results for input(s): CKTOTAL, CKMB, CKMBINDEX, TROPONINI in the last 168 hours. BNP (last 3 results) Recent Labs    10/17/24 1452  PROBNP 455.0*   HbA1C: No results for input(s): HGBA1C in the last 72 hours. CBG: No results for input(s): GLUCAP in the last 168 hours. Lipid Profile: No results for input(s): CHOL, HDL, LDLCALC, TRIG, CHOLHDL, LDLDIRECT in the last 72 hours. Thyroid  Function Tests: No results for input(s): TSH, T4TOTAL, FREET4,  T3FREE, THYROIDAB in the last 72 hours. Anemia Panel: No results for input(s): VITAMINB12, FOLATE, FERRITIN, TIBC, IRON , RETICCTPCT in the last 72 hours. Urine analysis:    Component Value Date/Time   COLORURINE YELLOW 09/17/2024 2209   APPEARANCEUR TURBID (A) 09/17/2024 2209   LABSPEC 1.015 09/17/2024 2209   PHURINE 5.0 09/17/2024 2209   GLUCOSEU NEGATIVE 09/17/2024 2209   HGBUR MODERATE (A) 09/17/2024 2209   BILIRUBINUR NEGATIVE 09/17/2024 2209   BILIRUBINUR n 03/18/2015 0928   KETONESUR NEGATIVE 09/17/2024 2209   PROTEINUR 100 (A) 09/17/2024 2209   UROBILINOGEN 2.0 03/18/2015 0928   UROBILINOGEN 1.0 07/16/2012 1345   NITRITE NEGATIVE 09/17/2024 2209   LEUKOCYTESUR MODERATE (A) 09/17/2024 2209   Sepsis Labs: @LABRCNTIP (procalcitonin:4,lacticidven:4) )No results found for this or any previous visit (from the past 240 hours).   Radiological Exams on Admission: DG Chest 2 View Result Date: 10/17/2024 CLINICAL DATA:  Worsening chest pressure EXAM: DG CHEST 2V COMPARISON:  Chest radiograph dated 06/15/2024 FINDINGS: Normal lung volumes. Bilateral interstitial opacities. Bibasilar patchy opacities. No pneumothorax. Trace bilateral pleural effusions. Enlarged cardiomediastinal silhouette. No acute osseous abnormality. IMPRESSION: 1. Cardiomegaly with pulmonary edema and trace bilateral pleural effusions. 2. Bibasilar patchy opacities, likely atelectasis. Electronically Signed   By: Limin  Xu M.D.   On: 10/17/2024 12:41    EKG: Independently reviewed.  Normal sinus rhythm.  Assessment/Plan Principal Problem:   Chest pain Active Problems:   Paraplegia (HCC)   Controlled type 2 diabetes mellitus with hyperglycemia, with long-term current use of  insulin  (HCC)   Stage IV pressure ulcer of sacral region [facility])   Essential hypertension   Neurogenic bladder   Hyperlipidemia   GERD (gastroesophageal reflux disease)   Colostomy present (HCC)   Obstructive sleep apnea    Diabetic peripheral neuropathy (HCC)   Sacral ulcer, with necrosis of muscle (HCC)   Benign neuroendocrine tumor of stomach    Anemia    Chest pain with known history of CAD status post PCI last stress test done in June 2021 was unremarkable.  Troponins are elevated but flat.  Cardiology has been consulted.  Will continue with patient's antiplatelet agents statins and beta-blockers.  Check 2D echo and D-dimer. Acute CHF with chest x-ray showing pulmonary edema IV Lasix  was given.  Continue with Lasix  home dose for now.  Check 2D echo.  Follow intake output metabolic panel.  Last 2D echo done on October 2024 showed EF of 60 to 65%. Diabetes mellitus type 2 on insulin  glargine patient states he only takes 25 units in the morning.  Last hemoglobin A1c 4 months ago was 7.1. Chronic anemia follow CBC. History of depression on Cymbalta  trazodone  and Lamictal . Chronic pain on oxycodone  and baclofen . Sleep apnea on CPAP at bedtime. Neuromyelitis optica with quadriparesis colostomy and suprapubic catheter on Rituxan  follows with neurology.  DVT prophylaxis: Lovenox . Code Status: Full code. Family Communication: Cussed with patient. Disposition Plan: Monitored bed. Consults called: Cardiology. Admission status: Observation.         [1]  Allergies Allergen Reactions   Cipro  [Ciprofloxacin  Hcl] Other (See Comments)    Hallucinations   Neurontin  [Gabapentin ] Other (See Comments)    Hallucinations   Levaquin  [Levofloxacin ] Rash   Penicillins Hives, Rash and Other (See Comments)    Tolerated Unasyn , Augmentin , cefepime  and cephalexin    "

## 2024-10-17 NOTE — ED Notes (Signed)
 Provided pt with ice water , per PA

## 2024-10-17 NOTE — ED Provider Triage Note (Signed)
 Emergency Medicine Provider Triage Evaluation Note  Brent [name]. , a 79 y.o. male  was evaluated in triage.  Pt complains of chest tightness/pressure with radiation of left arm that started last night this morning.  Reports nonexertional, not exerting himself or stressed in the moment.  He was just laying in bed.  History of previous ACS, stents remotely.  He reports that he is not on any kind of antiplatelet or blood thinner at this point.  1 episode of nitro and baby aspirin  with EMS and reports that chest pain is resolved, but was bradycardic heart rate of 42 with soft blood pressure systolic 92 after arrival.  Does endorse some shortness of breath associate with the chest pain.  Review of Systems  Positive: Chest pain, shortness of breath Negative:   Physical Exam  BP (!) 99/40 (BP Location: Left Arm)   Pulse 64   Temp 98.6 F (37 C) (Oral)   Resp 19   Ht 6' (1.829 m)   Wt 81.6 kg   SpO2 96%   BMI 24.41 kg/m  Gen:   Awake, no distress   Resp:  Normal effort  MSK:   Moves extremities without difficulty  Other:    Medical Decision Making  Medically screening exam initiated at 11:16 AM.  Appropriate orders placed.  Brent JULIANNA [name]. was informed that the remainder of the evaluation will be completed by another provider, this initial triage assessment does not replace that evaluation, and the importance of remaining in the ED until their evaluation is complete.  Workup initiated in triage    Brent Sherlean DEL, PA-C 10/17/24 1120

## 2024-10-17 NOTE — ED Provider Notes (Signed)
 Received patient in signout from previous provider pending completion of lab work.  See his note.  In short, patient presents to ED for evaluation of chest pain that radiates to left arm that started last night while laying in bed. Pain resolved following NTG, ASA provided by EMS then has recurrence of 3/10 CP 30 minutes following arrival that resolved. Following NTG patient's HR decreased to 42bpm and BP to 92 systolic.  EKG NSR with LVH and inverted T waves in leads one and AVL similar to previous. No acute ischemic changes  Last saw cardiology on 04/10/2019 with PA Lonell Bring.  Was seen in the past by Dr. Morris and Dr. Verlin.  History of angioplasty of RCA in 1990 with stent placement. His last cath was in 2012 showing stable moderate diffuse CAD. (70% mid LAD, 80% diagonal, 70% Ramus, 40% mid to distal RCA stent restenosis).  Last echo 07/15/2023 notable for LVEF 60-65%  Troponin from 2024 and 2022 are WNL. Does not have chronically elevated troponin. Elevation may be d/t demand ischemia in setting of mildly elevated BNP, pulmonary edema, trace pleural effusions   Physical Exam  BP (!) 118/49   Pulse 66   Temp 98.6 F (37 C) (Oral)   Resp 16   Ht 6' (1.829 m)   Wt 81.6 kg   SpO2 100%   BMI 24.41 kg/m   Physical Exam Vitals and nursing note reviewed.  Constitutional:      General: He is not in acute distress.    Appearance: Normal appearance.  HENT:     Head: Normocephalic and atraumatic.  Eyes:     Conjunctiva/sclera: Conjunctivae normal.  Cardiovascular:     Rate and Rhythm: Normal rate.  Pulmonary:     Effort: Pulmonary effort is normal. No respiratory distress.  Musculoskeletal:     Right lower leg: Edema present.     Left lower leg: Edema present.  Skin:    Coloration: Skin is not jaundiced or pale.  Neurological:     Mental Status: He is alert and oriented to person, place, and time. Mental status is at baseline.    ED Course / MDM    Medical Decision  Making Amount and/or Complexity of Data Reviewed Labs: ordered. Radiology: ordered.  Risk Prescription drug management. Decision regarding hospitalization.   Due to flat, however new, elevation of troponin as well as trace effusion, pulmonary edema, mildly elevated BNP, did consult cardiology Azobou Tonleu and discussed ED workup, disposition.  They recommend overnight obs, serial troponin  Patient prescribed lasix  20mg  every day at home. Provided lasix  20mg  IV  Discussed ED workup, disposition with patient expressed understand with the plan.  All questions answered to his satisfaction.  He is agreeable to admission  Consulted hospitalist Dr. Franky. and discussed ED workup, disposition.  They accept patient for admission    Minnie Tinnie BRAVO, [place] 10/17/24 1955    Elnor Jayson LABOR, DO 10/19/24 1859

## 2024-10-17 NOTE — ED Triage Notes (Signed)
 Pt BIB [facility] d/t chest pressure that began last night & was worse this morning. Does have a MOST form & is full code, rates his CP 6/10 & was given 1 baby ASA & 1 nitroglycerin  tab he was then brady afterwards at 42 bpm & SBP of 92. After 250cc NS via 18g Rt FA PIV he was 108/48 & 66 bpm, 95% on RA & CBG 150.

## 2024-10-17 NOTE — ED Provider Notes (Signed)
 " [place] [facility] Provider Note   CSN: [id number] Arrival date & time: 10/17/24  1041     Patient presents with: Chest Pressure   Brent [name]. is a 79 y.o. male With past medical history significant for hypertension, diabetes, paraplegia, suprapubic catheter, neuroendocrine stomach tumor, colostomy who presents with complaint of chest tightness/pressure with radiation of left arm that started last night this morning.  Reports nonexertional, or stressed in the moment.  He was just laying in bed.  History of previous ACS, stents remotely.  He reports that he is not on any kind of antiplatelet or blood thinner at this point.  1 episode of nitro and baby aspirin  with EMS and reports that chest pain is resolved, but was bradycardic heart rate of 42 with soft blood pressure systolic 92 after arrival.  Does endorse some shortness of breath associate with the chest pain.  Reports some mild increased lower extremity swelling.  No known history of heart failure.  I initially evaluated the patient in triage by time he made back to the room around 30 minutes later he does endorse some very mild return of chest pressure around 3/10.     HPI     Prior to Admission medications  Medication Sig Start Date End Date Taking? Authorizing Provider  acetaminophen  (TYLENOL ) 500 MG tablet Take 2 tablets (1,000 mg total) by mouth every 6 (six) hours as needed for mild pain (or Fever >/= 101). 05/31/23  Yes Samtani, Jai-Gurmukh, MD  acetic acid 0.25 % irrigation Irrigate with 1 Application as directed 2 (two) times daily. 10 ml   Yes [provider]  albuterol  (ACCUNEB ) 1.25 MG/3ML nebulizer solution Take 1 ampule by nebulization 2 (two) times daily as needed (for coughing).   Yes [provider]  ARTIFICIAL TEARS 1 % ophthalmic solution Place 1 drop into both eyes daily.   Yes [provider]  atenolol  (TENORMIN ) 25 MG tablet Take 12.5 mg by mouth  in the morning.   Yes [provider]  atorvastatin  (LIPITOR) 40 MG tablet Take 1 tablet (40 mg total) by mouth at bedtime. 05/31/23  Yes Samtani, Jai-Gurmukh, MD  azelastine  (ASTELIN ) 0.1 % nasal spray Place 2 sprays into both nostrils daily.   Yes [provider]  baclofen  (LIORESAL ) 10 MG tablet Take 10 mg by mouth 3 (three) times daily.   Yes [provider]  calcium  carbonate (TUMS - DOSED IN MG ELEMENTAL CALCIUM ) 500 MG chewable tablet Chew 1,000 mg by mouth every 4 (four) hours as needed for indigestion.   Yes [provider]  cholecalciferol  (VITAMIN D3) 25 MCG (1000 UNIT) tablet Take 2,000 Units by mouth daily.   Yes [provider]  dextromethorphan (DELSYM) 30 MG/5ML liquid Take 60 mg by mouth 2 (two) times daily as needed for cough.   Yes [provider]  DULoxetine  (CYMBALTA ) 30 MG capsule Take 1 capsule (30 mg total) by mouth at bedtime. 05/31/23  Yes Samtani, Jai-Gurmukh, MD  famotidine  (PEPCID ) 20 MG tablet Take 20 mg by mouth 2 (two) times daily.   Yes [provider]  ferrous sulfate  325 (65 FE) MG tablet Take 1 tablet (325 mg total) by mouth every other day. Patient taking differently: Take 325 mg by mouth daily. 07/27/23  Yes Odell Celinda Balo, MD  fluticasone  (FLONASE ) 50 MCG/ACT nasal spray Place 1 spray into both nostrils in the morning and at bedtime.   Yes [provider]  furosemide  (  LASIX ) 20 MG tablet Take 20 mg by mouth daily.   Yes [provider]  guaiFENesin  (MUCINEX ) 600 MG 12 hr tablet Take 600 mg by mouth 2 (two) times daily.   Yes [provider]  insulin  glargine-yfgn (SEMGLEE , YFGN,) 100 UNIT/ML Pen Inject 24 Units into the skin 2 (two) times daily. Patient taking differently: Inject 29 Units into the skin 2 (two) times daily. 07/27/23  Yes Odell Celinda Balo, MD  insulin  lispro (HUMALOG) 100 UNIT/ML injection Inject 0-12 Units into the skin 4 (four) times daily - after  meals and at bedtime. 70-200 0 units  201-250 2 units  251-300 4 units  301-350 6 units  351-400 8 units  401-450 10 units  451-600 12 units   Yes [provider]  ipratropium-albuterol  (DUONEB) 0.5-2.5 (3) MG/3ML SOLN Take 3 mLs by nebulization every 4 (four) hours as needed (wheezing/sob).   Yes [provider]  lamoTRIgine  (LAMICTAL ) 25 MG tablet Take 50 mg by mouth 2 (two) times daily.   Yes [provider]  loratadine  (CLARITIN ) 10 MG tablet Take 10 mg by mouth daily.   Yes [provider]  Menthol , Topical Analgesic, (BIOFREEZE) 4 % GEL Apply 1 application  topically 2 (two) times daily as needed (pain to back, upper and lower arms, and shoulders).   Yes [provider]  metFORMIN  (GLUCOPHAGE -XR) 500 MG 24 hr tablet Take 500 mg by mouth 2 (two) times daily before a meal.   Yes [provider]  ondansetron  (ZOFRAN -ODT) 4 MG disintegrating tablet Take 4 mg by mouth every 8 (eight) hours as needed for nausea or vomiting.   Yes [provider]  oxyCODONE  (OXY IR/ROXICODONE ) 5 MG immediate release tablet Take 1 tablet (5 mg total) by mouth every 6 (six) hours as needed for severe pain (pain score 7-10) or moderate pain (pain score 4-6) (for left shoulder pain). 06/24/24 06/24/25 Yes Pokhrel, Laxman, MD  PATADAY  0.1 % ophthalmic solution Place 1 drop into both eyes 2 (two) times daily.   Yes [provider]  phenol (CHLORASEPTIC) 1.4 % LIQD Use as directed 2 sprays in the mouth or throat as needed for throat irritation / pain.   Yes [provider]  polyethylene glycol (MIRALAX  / GLYCOLAX ) 17 g packet Take 17 g by mouth daily as needed for mild constipation or moderate constipation. 06/24/24  Yes Pokhrel, Laxman, MD  potassium chloride  (KLOR-CON ) 10 MEQ tablet Take 10 mEq by mouth daily.   Yes [provider]  ramelteon  (ROZEREM ) 8 MG tablet Take 8 mg by mouth at bedtime.   Yes [provider]   riTUXimab  (RITUXAN  IV) Inject 10 mg into the vein once. Between the 12th-13th of June, December.   Yes [provider]  senna-docusate (SENOKOT-S) 8.6-50 MG tablet Take 1 tablet by mouth 2 (two) times daily.   Yes [provider]  thiamine  (VITAMIN B1) 100 MG tablet Take 100 mg by mouth daily.   Yes [provider]  traZODone  (DESYREL ) 100 MG tablet Take 100 mg by mouth at bedtime.   Yes [provider]  vitamin C  (ASCORBIC ACID ) 500 MG tablet Take 500 mg by mouth daily.   Yes [provider]  zinc  sulfate 220 (50 Zn) MG capsule Take 1 capsule (220 mg total) by mouth daily. 07/07/23  Yes Cheryle Page, MD  sulfamethoxazole -trimethoprim  (BACTRIM  DS) 800-160 MG tablet Take 1 tablet by mouth 2 (two) times daily. Patient not taking: Reported on 10/17/2024  [provider]    Allergies: Cipro  [ciprofloxacin  hcl], Neurontin  [gabapentin ], Levaquin  [levofloxacin ], and Penicillins    Review of Systems  All other systems reviewed and are negative.   Updated Vital Signs BP (!) 118/49   Pulse 66   Temp 98.6 F (37 C) (Oral)   Resp 16   Ht 6' (1.829 m)   Wt 81.6 kg   SpO2 100%   BMI 24.41 kg/m   Physical Exam Vitals and nursing note reviewed.  Constitutional:      General: He is not in acute distress.    Appearance: Normal appearance.  HENT:     Head: Normocephalic and atraumatic.  Eyes:     General:        Right eye: No discharge.        Left eye: No discharge.  Cardiovascular:     Rate and Rhythm: Normal rate and regular rhythm.     Heart sounds: No murmur heard.    No friction rub. No gallop.  Pulmonary:     Effort: Pulmonary effort is normal.     Breath sounds: Normal breath sounds.  Abdominal:     General: Bowel sounds are normal.     Palpations: Abdomen is soft.  Musculoskeletal:     Comments: Mild bilateral lower extremity edema, equal bilaterally  Skin:    General: Skin is warm and dry.     Capillary Refill:  Capillary refill takes less than 2 seconds.  Neurological:     Mental Status: He is alert and oriented to person, place, and time.  Psychiatric:        Mood and Affect: Mood normal.        Behavior: Behavior normal.     (all labs ordered are listed, but only abnormal results are displayed) Labs Reviewed  BASIC METABOLIC PANEL WITH GFR - Abnormal; Notable for the following components:      Result Value   Glucose, Bld 147 (*)    Creatinine, Ser 0.59 (*)    Calcium  8.4 (*)    All other components within normal limits  CBC - Abnormal; Notable for the following components:   WBC 11.4 (*)    RBC 3.34 (*)    Hemoglobin 8.3 (*)    HCT 27.3 (*)    MCH 24.9 (*)    RDW 17.7 (*)    All other components within normal limits  TROPONIN T, HIGH SENSITIVITY - Abnormal; Notable for the following components:   Troponin T High Sensitivity 68 (*)    All other components within normal limits  PRO BRAIN NATRIURETIC PEPTIDE  TROPONIN T, HIGH SENSITIVITY    EKG: EKG Interpretation Date/Time:  Thursday October 17 2024 10:58:33 EST Ventricular Rate:  64 PR Interval:  216 QRS Duration:  98 QT Interval:  458 QTC Calculation: 472 R Axis:   -22  Text Interpretation: Sinus rhythm with 1st degree A-V block Left ventricular hypertrophy with repolarization abnormality ( R in aVL , Cornell product ) Abnormal ECG No significant change since last tracing Confirmed by Emil Share [phone]) on 10/17/2024 12:36:54 PM  Radiology: DG Chest 2 View Result Date: 10/17/2024 CLINICAL DATA:  Worsening chest pressure EXAM: DG CHEST 2V COMPARISON:  Chest radiograph dated 06/15/2024 FINDINGS: Normal lung volumes. Bilateral interstitial opacities. Bibasilar patchy opacities. No pneumothorax. Trace bilateral pleural effusions. Enlarged cardiomediastinal silhouette. No acute osseous abnormality. IMPRESSION: 1. Cardiomegaly with pulmonary edema and trace bilateral pleural effusions. 2. Bibasilar patchy opacities, likely  atelectasis. Electronically Signed   By:  Limin  Xu M.D.   On: 10/17/2024 12:41     Procedures   Medications Ordered in the ED - No data to display                                  Medical Decision Making Amount and/or Complexity of Data Reviewed Labs: ordered. Radiology: ordered.   This patient is a 79 y.o. male  who presents to the ED for concern of chest pain.   Differential diagnoses prior to evaluation: The emergent differential diagnosis includes, but is not limited to,  ACS, AAS, PE, Mallory-Weiss, Boerhaave's, Pneumonia, acute bronchitis, asthma or COPD exacerbation, anxiety, MSK pain or traumatic injury to the chest, acid reflux versus other . This is not an exhaustive differential.   Past Medical History / Co-morbidities / Social History: hypertension, diabetes, paraplegia, suprapubic catheter, neuroendocrine stomach tumor, colostomy  Additional history: Chart reviewed. Pertinent results include: Reviewed lab work, imaging from previous emergency department visits, outpatient family medicine visits please  Physical Exam: Physical exam performed. The pertinent findings include: Initially borderline hypotensive, blood pressure 99/40, improved to 118/49 without intervention.  Vital signs otherwise stable.  Afebrile, stable oxygen saturation on room air.  He has some mild bilateral lower extremity edema.  Lab Tests/Imaging studies: I personally interpreted labs/imaging and the pertinent results include: CBC with mild leukocytosis, white blood cells 11.4, anemia, hemoglobin 8.3 is overall stable compared to baseline.  BMP overall unremarkable, mildly elevated nonfasting glucose at 147, mild hypocalcemia, 8.4.  His initial troponin is elevated at 68, pending BNP, delta troponin..  Plain from chest x-ray shows  1. Cardiomegaly with pulmonary edema and trace bilateral pleural  effusions.  2. Bibasilar patchy opacities, likely atelectasis.  I agree with the radiologist  interpretation.  Cardiac monitoring: EKG obtained and interpreted by myself and attending physician which shows: NSR, first degree AV block, LVH   3:05 PM Care of Hoa Deriso. transferred to PA Tinnie Matter and Dr. Elnor at the end of my shift as the patient will require reassessment once labs/imaging have resulted. Patient presentation, ED course, and plan of care discussed with review of all pertinent labs and imaging. Please see his/her note for further details regarding further ED course and disposition. Plan at time of handoff is repeat troponin, BNP, possible cardiology eval. This may be altered or completely changed at the discretion of the oncoming team pending results of further workup.   Final diagnoses:  None    ED Discharge Orders     None          Rosan Sherlean DEL, PA-C 10/17/24 1505    Emil Share, DO 10/17/24 1515  "

## 2024-10-18 ENCOUNTER — Inpatient Hospital Stay (HOSPITAL_COMMUNITY)

## 2024-10-18 DIAGNOSIS — I5033 Acute on chronic diastolic (congestive) heart failure: Secondary | ICD-10-CM

## 2024-10-18 DIAGNOSIS — I2510 Atherosclerotic heart disease of native coronary artery without angina pectoris: Secondary | ICD-10-CM

## 2024-10-18 DIAGNOSIS — R079 Chest pain, unspecified: Secondary | ICD-10-CM | POA: Diagnosis not present

## 2024-10-18 LAB — GLUCOSE, CAPILLARY
Glucose-Capillary: 128 mg/dL — ABNORMAL HIGH (ref 70–99)
Glucose-Capillary: 185 mg/dL — ABNORMAL HIGH (ref 70–99)

## 2024-10-18 LAB — ECHOCARDIOGRAM COMPLETE
Area-P 1/2: 2.95 cm2
Calc EF: 60.2 %
Height: 72 in
MV M vel: 4.6 m/s
MV Peak grad: 84.6 mmHg
S' Lateral: 3.6 cm
Single Plane A2C EF: 56.3 %
Single Plane A4C EF: 62.6 %
Weight: 2880 [oz_av]

## 2024-10-18 LAB — COMPREHENSIVE METABOLIC PANEL WITH GFR
ALT: 17 U/L (ref 0–44)
AST: 24 U/L (ref 15–41)
Albumin: 2.8 g/dL — ABNORMAL LOW (ref 3.5–5.0)
Alkaline Phosphatase: 108 U/L (ref 38–126)
Anion gap: 11 (ref 5–15)
BUN: 17 mg/dL (ref 8–23)
CO2: 25 mmol/L (ref 22–32)
Calcium: 8.7 mg/dL — ABNORMAL LOW (ref 8.9–10.3)
Chloride: 99 mmol/L (ref 98–111)
Creatinine, Ser: 0.66 mg/dL (ref 0.61–1.24)
GFR, Estimated: >60 mL/min
Glucose, Bld: 160 mg/dL — ABNORMAL HIGH (ref 70–99)
Potassium: 4.1 mmol/L (ref 3.5–5.1)
Sodium: 135 mmol/L (ref 135–145)
Total Bilirubin: 0.4 mg/dL (ref 0.0–1.2)
Total Protein: 6.9 g/dL (ref 6.5–8.1)

## 2024-10-18 LAB — CBC
HCT: 29.3 % — ABNORMAL LOW (ref 39.0–52.0)
Hemoglobin: 8.8 g/dL — ABNORMAL LOW (ref 13.0–17.0)
MCH: 24.2 pg — ABNORMAL LOW (ref 26.0–34.0)
MCHC: 30 g/dL (ref 30.0–36.0)
MCV: 80.7 fL (ref 80.0–100.0)
Platelets: 269 10*3/uL (ref 150–400)
RBC: 3.63 MIL/uL — ABNORMAL LOW (ref 4.22–5.81)
RDW: 17.9 % — ABNORMAL HIGH (ref 11.5–15.5)
WBC: 10.8 10*3/uL — ABNORMAL HIGH (ref 4.0–10.5)
nRBC: 0 % (ref 0.0–0.2)

## 2024-10-18 LAB — CBG MONITORING, ED
Glucose-Capillary: 161 mg/dL — ABNORMAL HIGH (ref 70–99)
Glucose-Capillary: 177 mg/dL — ABNORMAL HIGH (ref 70–99)

## 2024-10-18 MED ORDER — SODIUM CHLORIDE 0.9 % IV BOLUS
250.0000 mL | Freq: Once | INTRAVENOUS | Status: AC
  Administered 2024-10-18: 250 mL via INTRAVENOUS

## 2024-10-18 MED ORDER — SODIUM CHLORIDE 0.9 % IV SOLN
1.0000 g | INTRAVENOUS | Status: DC
  Administered 2024-10-18 – 2024-10-21 (×4): 1 g via INTRAVENOUS
  Filled 2024-10-18 (×4): qty 10

## 2024-10-18 MED ORDER — MORPHINE SULFATE (PF) 2 MG/ML IV SOLN
2.0000 mg | INTRAVENOUS | Status: DC | PRN
  Administered 2024-10-18: 2 mg via INTRAVENOUS
  Filled 2024-10-18: qty 1

## 2024-10-18 MED ORDER — ASPIRIN 81 MG PO TBEC
81.0000 mg | DELAYED_RELEASE_TABLET | Freq: Every day | ORAL | Status: DC
  Administered 2024-10-18 – 2024-10-21 (×4): 81 mg via ORAL
  Filled 2024-10-18 (×4): qty 1

## 2024-10-18 MED ORDER — POLYVINYL ALCOHOL 1.4 % OP SOLN: 1.0000 [drp] | OPHTHALMIC | Status: DC | PRN

## 2024-10-18 NOTE — Consult Note (Addendum)
 "  Cardiology Consultation   Patient ID: Brent [name]. MRN: [id number]; DOB: 07-03-1946  Admit date: 10/17/2024 Date of Consult: 10/18/2024  PCP:  Patient, No Pcp Per   [facility] Providers Cardiologist:  Brent Cash, MD        Patient Profile: Brent [name]. is a 79 y.o. male with a hx of CAD s/p PCI (angioplasty of RCA in 1990 and BMS to RCA in 2000 followed by rotational atherectomy shortly thereafter for stent restenosis), chronic HFpEF, HTN, HLD, type 2 diabetes mellitus, OSA, paraplegia/wheelchair-bound, chronic stage IV sacral decubitus ulcer who is being seen 10/18/2024 for the evaluation of chest pain at the request of Dr. Odell Castor.  History of Present Illness: Brent [name] has medical history as stated above. Of note, patient has history of CAD s/p PCI including angioplasty of RCA in 1990 and BMS to RCA in 2000 followed by rotational atherectomy shortly thereafter for stent restenosis.  Cardiac catheterization in 2012 showed stable moderate diffuse CAD (70% mid LAD, 80% diagonal, 70% ramus, 40% mid to distal RCA stent restenosis). Continues to do well on medical management. Stress Myoview  in 2015 showed hypertensive response to exercise, 1 mm ST segment depression in inferior and lateral leads with stress but no ischemia and felt to be overall low risk.  Previously followed by lipid clinic but has been lost to follow-up since July 2019. Has also been lost to follow-up to outpatient cardiology, last seen in July 2020 for routine follow-up at which time he was overall stable. Later admitted in July 2021 for chest pain. Low risk inpatient stress Myoview , EF 53%. Echo showed EF 60 to 65%, no RWMA, mild LVH, normal RV. Discharged on continued medical therapy for CAD. Admitted in October 2024 and in November 2024 with acute HFpEF, both times diuresed well and discharged on scheduled PO Lasix .  More recently, patient was admitted in September 2025 for severe sepsis  secondary to acute osteomyelitis of the left ischium and posterior thigh abscess revealed by CT. S/p wound exploration with I&D and open packing of left posterior thigh abscess on 06/18/24. Discharged to [facility]. Then seen in the ED twice in December 2025 for concerns regarding leakage around his suprapubic catheter. No indication for admission either time, discharged back to SNF in stable condition.  Presently, patient presented to the ED on 1/29 complaining of chest pain that radiates to his left arm which started last night while laying in bed. Pain resolved with NTG. ASA provided by EMS. He had recurrence of 3/10 severity chest pain approximately 30 minutes after arrival that resolved with NTG. Of note, HR down to 42 bpm and SBP to 92 mmHg following NTG administration. EKG showed sinus rhythm with first-degree AV block, LVH and TWI in leads I and aVL.  Hs-troponins mildly elevated with flat trend 68, 70. ProBNP unremarkable for age. CXR revealed cardiomegaly with pulmonary edema and trace bilateral pleural effusions.  Upon speaking to the patient today, he states that he is feeling better compared to when he first arrived to the ED. Denies any recurrence of chest pain since yesterday morning. He describes 7/10 pain in the substernal/epigastric region without radiation that comes on while he is asleep. Nonreproducible on palpation. Denies any other symptoms associated with these episodes. He cannot recall how long he has been having this pain but states that it occurs a couple times per week. He is also unsure of how long it lasts because he is able to  go back to sleep despite the pain and it resolves by the time he wakes up. Also reports dyspnea and swelling in his feet over the past week, both of which are new for him. No use of supplemental oxygen at baseline. Notes occasional acid reflux but no eructation or cough. Residing at [facility] and he is unable to tell me what medications he is taking.     Past Medical History:  Diagnosis Date   Acute osteomyelitis of metatarsal bone of right foot (HCC) 07/01/2023   Allergy     Aortic atherosclerosis 07/01/2023   Benign neuroendocrine tumor of stomach (HCC) 04/04/2022   CAD (coronary artery disease)    a. angioplasty of his RCA in 1990. b. bare metal stent placed in the RCA in 2000 followed by rotational atherectomy shortly after for stent restenosis. c. last cath was in 2012 showing stable moderate diffuse CAD. (70% mid LAD, 80% diagonal, 70% Ramus, 40% mid to distal RCA stent restenosis). d. Low risk nuc in 2015.   Diabetes mellitus    Diverticulosis    Elevated CK    Erectile dysfunction    Hemorrhoids    HTN (hypertension)    Hyperlipidemia    Hypertriglyceridemia    Malignant melanoma of left side of neck (HCC) 10/25/2018   Myocardial infarction (HCC)    Obesity    OSA (obstructive sleep apnea)    Persistent disorder of initiating or maintaining sleep    Personal history of colonic polyps 02/05/2003   Renal lesion 06/20/2016   Septic arthritis of foot (HCC) 07/01/2023   Stage IV decubitus ulcer (HCC) 06/30/2023    Past Surgical History:  Procedure Laterality Date   AMPUTATION TOE Right 07/02/2023   Procedure: AMPUTATION TOE FIFTH RAY;  Surgeon: Barton Drape, MD;  Location: [facility];  Service: Orthopedics;  Laterality: Right;   BIOPSY  03/26/2022   Procedure: BIOPSY;  Surgeon: Avram Lupita BRAVO, MD;  Location: [facility] ENDOSCOPY;  Service: Gastroenterology;;   CHOLECYSTECTOMY     CORONARY ANGIOPLASTY     CORONARY STENT PLACEMENT     stenting of the right coronary artery with followup rotational  atherectomy. (3 stents placed)   ENDOSCOPIC MUCOSAL RESECTION  10/13/2022   Procedure: ENDOSCOPIC MUCOSAL RESECTION;  Surgeon: Wilhelmenia Aloha Raddle., MD;  Location: [facility];  Service: Gastroenterology;;   ESOPHAGOGASTRODUODENOSCOPY (EGD) WITH PROPOFOL  N/A 03/26/2022   Procedure: ESOPHAGOGASTRODUODENOSCOPY (EGD) WITH PROPOFOL ;   Surgeon: Avram Lupita BRAVO, MD;  Location: [facility] ENDOSCOPY;  Service: Gastroenterology;  Laterality: N/A;   ESOPHAGOGASTRODUODENOSCOPY (EGD) WITH PROPOFOL  N/A 07/11/2022   Procedure: ESOPHAGOGASTRODUODENOSCOPY (EGD) WITH PROPOFOL ;  Surgeon: Wilhelmenia Aloha Raddle., MD;  Location: [facility];  Service: Gastroenterology;  Laterality: N/A;   ESOPHAGOGASTRODUODENOSCOPY (EGD) WITH PROPOFOL  N/A 10/13/2022   Procedure: ESOPHAGOGASTRODUODENOSCOPY (EGD) WITH PROPOFOL ;  Surgeon: Wilhelmenia Aloha Raddle., MD;  Location: [facility];  Service: Gastroenterology;  Laterality: N/A;   EUS N/A 10/13/2022   Procedure: UPPER ENDOSCOPIC ULTRASOUND (EUS) RADIAL;  Surgeon: Wilhelmenia Aloha Raddle., MD;  Location: [facility];  Service: Gastroenterology;  Laterality: N/A;   FINGER SURGERY     right   FOOT SURGERY     right   HEMOSTASIS CLIP PLACEMENT  07/11/2022   Procedure: HEMOSTASIS CLIP PLACEMENT;  Surgeon: Wilhelmenia Aloha Raddle., MD;  Location: [facility];  Service: Gastroenterology;;  ovesco    HEMOSTASIS CLIP PLACEMENT  10/13/2022   Procedure: HEMOSTASIS CLIP PLACEMENT;  Surgeon: Wilhelmenia Aloha Raddle., MD;  Location: [facility];  Service: Gastroenterology;;   HOT HEMOSTASIS N/A 07/11/2022  Procedure: HOT HEMOSTASIS (ARGON PLASMA COAGULATION/BICAP);  Surgeon: Wilhelmenia Aloha Raddle., MD;  Location: [facility];  Service: Gastroenterology;  Laterality: N/A;   HOT HEMOSTASIS N/A 10/13/2022   Procedure: HOT HEMOSTASIS (ARGON PLASMA COAGULATION/BICAP);  Surgeon: Wilhelmenia Aloha Raddle., MD;  Location: [facility];  Service: Gastroenterology;  Laterality: N/A;   INCISION AND DRAINAGE ABSCESS Left 06/18/2024   Procedure: INCISIOIN AND DRAINAGE LEFT POSTERIOR THIGH ABSCESS;  Surgeon: Eletha Boas, MD;  Location: [facility];  Service: General;  Laterality: Left;  Left posterior thigh   INGUINAL HERNIA REPAIR     right   IR FLUORO PROCEDURE UNLISTED  02/21/2023   IR GASTROSTOMY TUBE REMOVAL  02/10/2022   IR REPLACE G-TUBE  SIMPLE WO FLUORO  03/14/2023   IR REPLC GASTRO/COLONIC TUBE PERCUT W/FLUORO  12/22/2022   IR REPLC GASTRO/COLONIC TUBE PERCUT W/FLUORO  01/26/2023   IR REPLC GASTRO/COLONIC TUBE PERCUT W/FLUORO  03/06/2023   IR REPLC GASTRO/COLONIC TUBE PERCUT W/FLUORO  04/27/2023   IRRIGATION AND DEBRIDEMENT ABSCESS N/A 06/04/2020   Procedure: IRRIGATION AND DEBRIDEMENT SACRAL WOUND;  Surgeon: Paola Dreama SAILOR, MD;  Location: [facility] OR;  Service: General;  Laterality: N/A;   LAPAROSCOPY N/A 06/04/2020   Procedure: LAPAROSCOPY ASSISTED COLOSTOMY, LAPAROSCOPY GASTROSTOMY;  Surgeon: Paola Dreama SAILOR, MD;  Location: [facility] OR;  Service: General;  Laterality: N/A;   LAPAROSCOPY N/A 05/24/2023   Procedure: LAPAROSCOPY DIAGNOSTIC;  Surgeon: Ebbie Cough, MD;  Location: [facility] OR;  Service: General;  Laterality: N/A;   LAPAROTOMY N/A 05/24/2023   Procedure: WEDGED RESECTION OF STOMACH AND GASTROCUTANEOUS FISTULA;  Surgeon: Ebbie Cough, MD;  Location: [facility] OR;  Service: General;  Laterality: N/A;   LYSIS OF ADHESION N/A 06/04/2020   Procedure: LYSIS OF ADHESION;  Surgeon: Paola Dreama SAILOR, MD;  Location: [facility] OR;  Service: General;  Laterality: N/A;   LYSIS OF ADHESION N/A 05/24/2023   Procedure: LYSIS OF ADHESION;  Surgeon: Ebbie Cough, MD;  Location: [facility] OR;  Service: General;  Laterality: N/A;   melanoma removal     neck   ORTHOPEDIC SURGERY     foot right   POLYPECTOMY  10/13/2022   Procedure: POLYPECTOMY;  Surgeon: Wilhelmenia Aloha Raddle., MD;  Location: [facility];  Service: Gastroenterology;;   rotator cuff surg     Bil   STENT REMOVAL  10/13/2022   Procedure: CLIP REMOVAL;  Surgeon: Wilhelmenia Aloha Raddle., MD;  Location: [facility];  Service: Gastroenterology;;   SUBMUCOSAL LIFTING INJECTION  10/13/2022   Procedure: SUBMUCOSAL LIFTING INJECTION;  Surgeon: Wilhelmenia Aloha Raddle., MD;  Location: [facility];  Service: Gastroenterology;;   SUBMUCOSAL TATTOO INJECTION  10/13/2022   Procedure: SUBMUCOSAL TATTOO  INJECTION;  Surgeon: Wilhelmenia Aloha Raddle., MD;  Location: [facility];  Service: Gastroenterology;;       Scheduled Meds:  ascorbic acid   500 mg Oral Daily   aspirin  EC  81 mg Oral Daily   atenolol   12.5 mg Oral q AM   atorvastatin   40 mg Oral QHS   baclofen   10 mg Oral TID   carboxymethylcellulose  1 drop Both Eyes Daily   cholecalciferol   2,000 Units Oral Daily   DULoxetine   30 mg Oral QHS   enoxaparin  (LOVENOX ) injection  40 mg Subcutaneous Q24H   famotidine   20 mg Oral BID   furosemide   20 mg Oral Daily   insulin  aspart  0-9 Units Subcutaneous TID WC   insulin  glargine  24 Units Subcutaneous BID   lamoTRIgine   50 mg Oral BID   olopatadine   1  drop Both Eyes BID   potassium chloride   10 mEq Oral Daily   ramelteon   8 mg Oral QHS   senna-docusate  1 tablet Oral BID   thiamine   100 mg Oral Daily   traZODone   100 mg Oral QHS   zinc  sulfate (50mg  elemental zinc )  220 mg Oral Daily   Continuous Infusions:  cefTRIAXone  (ROCEPHIN )  IV Stopped (10/18/24 0944)   PRN Meds: oxyCODONE , polyethylene glycol  Allergies:   Allergies[1]  Social History:   Social History   Socioeconomic History   Marital status: Married    Spouse name: Not on file   Number of children: 1   Years of education: Not on file   Highest education level: Not on file  Occupational History   Occupation: post-master    Comment: [place]  Tobacco Use   Smoking status: Former    Current packs/day: 0.00    Average packs/day: 1.5 packs/day for 30.0 years (45.0 ttl pk-yrs)    Types: Cigarettes    Start date: 09/19/1950    Quit date: 09/19/1980    Years since quitting: 44.1    Passive exposure: Never   Smokeless tobacco: Never  Vaping Use   Vaping status: Never Used  Substance and Sexual Activity   Alcohol  use: Not Currently    Alcohol /week: 7.0 - 14.0 standard drinks of alcohol     Types: 7 - 14 Shots of liquor per week   Drug use: No   Sexual activity: Not Currently    Partners: Female  Other  Topics Concern   Not on file  Social History Narrative   12/14/2018: Lives with wife and 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 (son had passed away several years ago)   Recevies much of his care through [place]   Enjoys golfing   Social Drivers of Health   Tobacco Use: Medium Risk (10/17/2024)   Patient History    Smoking Tobacco Use: Former    Smokeless Tobacco Use: Never    Passive Exposure: Never  Programmer, Applications: Not on file  Food Insecurity: No Food Insecurity (10/18/2024)   Epic    Worried About Programme Researcher, Broadcasting/film/video in the Last Year: Never true    Ran Out of Food in the Last Year: Never true  Transportation Needs: No Transportation Needs (10/18/2024)   Epic    Lack of Transportation (Medical): No    Lack of Transportation (Non-Medical): No  Physical Activity: Not on file  Stress: Not on file  Social Connections: Socially Isolated (10/18/2024)   Social Connection and Isolation Panel    Frequency of Communication with Friends and Family: Never    Frequency of Social Gatherings with Friends and Family: Never    Attends Religious Services: Never    Database Administrator or Organizations: No    Attends Banker Meetings: Never    Marital Status: Married  Catering Manager Violence: Not At Risk (10/18/2024)   Epic    Fear of Current or Ex-Partner: No    Emotionally Abused: No    Physically Abused: No    Sexually Abused: No  Depression (PHQ2-9): Low Risk (12/21/2023)   Depression (PHQ2-9)    PHQ-2 Score: 0  Alcohol  Screen: Not on file  Housing: Low Risk (10/18/2024)   Epic    Unable to Pay for Housing in the Last Year: No    Number of Times Moved in the Last Year: 0    Homeless in the Last Year: No  Utilities: Not At Risk (  10/18/2024)   Epic    Threatened with loss of utilities: No  Health Literacy: Not on file    Family History:    Family History  Problem Relation Age of Onset   Lung cancer Mother    COPD Mother    Obesity Mother    Liver cancer Father    Lung  cancer Father    Heart disease Father    Heart disease Sister    Colon cancer Neg Hx    Stomach cancer Neg Hx    Pancreatic cancer Neg Hx    Esophageal cancer Neg Hx    Inflammatory bowel disease Neg Hx    Liver disease Neg Hx    Rectal cancer Neg Hx      ROS:  Please see the history of present illness.   All other ROS reviewed and negative.     Physical Exam/Data: Vitals:   10/18/24 1100 10/18/24 1101 10/18/24 1221 10/18/24 1300  BP: (!) 96/51  (!) 91/39 (!) 98/55  Pulse: 63 63 61 67  Resp: (!) 22 15 18 15   Temp:   98.6 F (37 C)   TempSrc:   Oral   SpO2: 97% 97% 98% 100%  Weight:      Height:       No intake or output data in the 24 hours ending 10/18/24 1313    10/17/2024   11:06 AM 06/18/2024    8:29 AM 06/15/2024   12:21 PM  Last 3 Weights  Weight (lbs) 180 lb 150 lb 150 lb  Weight (kg) 81.647 kg 68.04 kg 68.04 kg     Body mass index is 24.41 kg/m.  General: Chronically ill-appearing elderly male, resting in bed on room air, in no acute distress Neck: Low neck JVD at 15 degrees Vascular: Distal pulses 2+ bilaterally Cardiac: Normal S1, S2; RRR; no murmur  Lungs: Clear to auscultation bilaterally, no wheezing, rhonchi or rales  Abd: Soft, nontender Ext: 2+ pedal edema bilaterally, wrapped from knees to feet; s/p R fifth ray amputation Skin: Warm and dry  Neuro: No focal abnormalities noted Psych: Normal affect   EKG: The EKG was personally reviewed and demonstrates: Sinus rhythm with 1st degree AV block, TWI in leads I and aVL Telemetry: Telemetry was personally reviewed and demonstrates: Sinus rhythm, rates in the 60s  Relevant CV Studies:  Echo [07/15/23]: 1. Left ventricular ejection fraction, by estimation, is 60 to 65% . The left ventricle has normal function. The left ventricle has no regional wall motion abnormalities. There is mild left ventricular hypertrophy. Left ventricular diastolic parameters were normal. 2. Right ventricular systolic function  is normal. The right ventricular size is normal. Tricuspid regurgitation signal is inadequate for assessing PA pressure. 3. A small pericardial effusion is present. 4. The mitral valve is normal in structure. Trivial mitral valve regurgitation. No evidence of mitral stenosis. 5. The aortic valve is tricuspid. Aortic valve regurgitation is not visualized. Aortic valve sclerosis is present, with no evidence of aortic valve stenosis. 6. The inferior vena cava is dilated in size with > 50% respiratory variability, suggesting right atrial pressure of 8 mmHg.  Stress myoview  [03/09/20]: 1. No reversible ischemia or infarction. 2. Normal left ventricular wall motion. 3. Left ventricular ejection fraction 53% 4. Non invasive risk stratification*: Low   Laboratory Data: High Sensitivity Troponin:  No results for input(s): TROPONINIHS in the last 720 hours.  Recent Labs  Lab 10/17/24 1144 10/17/24 1452  TRNPT 68* 70*  Chemistry Recent Labs  Lab 10/17/24 1144 10/18/24 0307  NA 135 135  K 4.5 4.1  CL 101 99  CO2 23 25  GLUCOSE 147* 160*  BUN 18 17  CREATININE 0.59* 0.66  CALCIUM  8.4* 8.7*  GFRNONAA >60 >60  ANIONGAP 11 11    Recent Labs  Lab 10/18/24 0307  PROT 6.9  ALBUMIN  2.8*  AST 24  ALT 17  ALKPHOS 108  BILITOT 0.4   Lipids No results for input(s): CHOL, TRIG, HDL, LABVLDL, LDLCALC, CHOLHDL in the last 168 hours.  Hematology Recent Labs  Lab 10/17/24 1144 10/18/24 0307  WBC 11.4* 10.8*  RBC 3.34* 3.63*  HGB 8.3* 8.8*  HCT 27.3* 29.3*  MCV 81.7 80.7  MCH 24.9* 24.2*  MCHC 30.4 30.0  RDW 17.7* 17.9*  PLT 261 269   Thyroid  No results for input(s): TSH, FREET4 in the last 168 hours.  BNP Recent Labs  Lab 10/17/24 1452  PROBNP 455.0*    DDimer No results for input(s): DDIMER in the last 168 hours.  Radiology/Studies:  DG Chest 2 View Result Date: 10/17/2024 CLINICAL DATA:  Worsening chest pressure EXAM: DG CHEST 2V COMPARISON:  Chest  radiograph dated 06/15/2024 FINDINGS: Normal lung volumes. Bilateral interstitial opacities. Bibasilar patchy opacities. No pneumothorax. Trace bilateral pleural effusions. Enlarged cardiomediastinal silhouette. No acute osseous abnormality. IMPRESSION: 1. Cardiomegaly with pulmonary edema and trace bilateral pleural effusions. 2. Bibasilar patchy opacities, likely atelectasis. Electronically Signed   By: Limin  Xu M.D.   On: 10/17/2024 12:41     Assessment and Plan: Chest pain Elevated troponins CAD s/p PCI x2 More so substernal/epigastric pain, occurring only at night, without any associated symptoms Serial EKGs showed sinus rhythm with first-degree AV block, LVH and TWI in leads I and aVL Hs-tropnins elevated but flat 68, 70 CXR showed cardiomegaly with pulmonary edema and trace bilateral pleural effusions S/p angioplasty of RCA in 1990 and BMS to RCA in 2000 followed by rotational atherectomy shortly thereafter for stent restenosis Last ischemic eval with low risk stress Myoview  in 2021, EF 53% Most recent echo 06/2023 showed preserved EF, no RWMA Overall atypical chest pain with low suspicion for ACS, suspect elevated troponins due to demand ischemia in the setting of dyspnea and ADHF. Nonetheless, patient still of increased risk given CAD/PCI history and other risk factors. Ordered repeat echo. If echo with newly reduced EF or RWMA, can consider inpatient ischemic evaluation. If echo stable, can arrange outpatient ischemic evaluation with stress PET and outpatient cardiology follow-up Continue ASA, atorvastatin  40 mg daily  Pulmonary edema ?Acute on chronic HFpEF Home meds: Lasix  20 mg daily. Previously on losartan  and spironolactone  but both were discontinued at after 07/2023 hospitalization Reports new dyspnea and bilateral pedal edema x 1 week proBNP unremarkable for age CXR showed pulmonary edema and trace bilateral pleural effusions Most recent echo 06/2023 showed preserved EF,  mild LVH, normal RV, small pericardial effusion. Ordered repeat echo S/p IV Lasix  20 mg x1 dose in the ED. On Lasix  20 mg daily at SNF PTA. Possible third spacing component given chronic hypoalbuminemia No I/O documented in chart, 200 cc in Foley bag at time of exam GDMT: Switch from atenolol  to metoprolol  12.5 mg daily as BP allows Continue PO lasix  Continue strict I/Os, daily BMPs, daily weights  Per primary Possible UTI in the setting of neurogenic bladder Type 2 diabetes mellitus IDA Chronic pain OSA Neuromyelitis optica disorder Chronic stage IV sacral decubitus ulcer   Risk Assessment/Risk Scores:  New [place]  Heart Association (NYHA) Functional Class NYHA Class II   For questions or updates, please contact [facility] Please consult [email or web address] for contact info under      Signed, Owen MARLA Daniels, PA-C  10/18/2024 1:13 PM   Patient seen and examined.  Agree with above documentation.  Brent [name] is an 79 year old male with a history of CAD, chronic diastolic heart failure, T2DM, OSA, paraplegia, sacral decubitus ulcer who we are consulted by Dr. Odell Castor for evaluation of chest pain.  He has a history of CAD and underwent angioplasty of RCA in 1990, BMS to RCA in 2000 followed by rotational atherectomy for in-stent restenosis.  LHC in 2012 showed stable moderate diffuse CAD (70% mid LAD, 80% diagonal, 70% ramus, 40% mid to distal RCA stenosis).  He has been managed medically.  Myoview  in 2015 showed ST depressions but no ischemia on perfusion images, low risk study.  He has not followed with outpatient cardiology since 03/2019 but was admitted with chest pain 03/2020 and underwent inpatient stress Myoview , which was low risk.  Echo at that time showed EF 60 to 65%.  He was admitted 05/2024 with sepsis due to osteomyelitis of left ischium and posterior thigh abscess.  He presented to ED yesterday with chest pain.  However he reports it was actually more abdominal pain.  EKG  shows sinus rhythm, 1 mm ST depression in leads I, aVL, T wave inversions in leads I, aVL, rate 75.  Troponin 68 > 70.  On exam, patient is alert and oriented, regular rate and rhythm, no murmurs, diminished breath sounds, Ace wrap around both legs, no JVD  For his chest pain, given reports atypical pain with mildly elevated but flat troponins, no evidence of acute coronary syndrome.  Recommend echocardiogram.  If EF reduced, we will plan for inpatient cath.  If EF normal, will plan outpatient stress PET.  He received IV Lasix  on presentation, currently appears euvolemic except for lower extremity edema, suspect low albumin  contributing to this.  Would hold off on further IV diuresis.  Brent LITTIE Nanas, MD      [1]  Allergies Allergen Reactions   Cipro  [Ciprofloxacin  Hcl] Other (See Comments)    Hallucinations   Neurontin  [Gabapentin ] Other (See Comments)    Hallucinations   Levaquin  [Levofloxacin ] Rash   Penicillins Hives, Rash and Other (See Comments)    Tolerated Unasyn , Augmentin , cefepime  and cephalexin    "

## 2024-10-18 NOTE — Consult Note (Signed)
 WOC Nurse Consult Note: Reason for Consult: Stage IV sacral decubitus ulcer  Wound type: Bilateral ischial; Stage 4 Pressure Injuries Sacrum; Stage 4 Pressure Injuries  Pressure Injury POA: Yes Measurement:area on the sacrum is scattered; wound suggest measuring the entire area See nursing flow sheets  Wound bed:all wounds are clean, chronic (present since 2022) or longer, pale, some granulation  Drainage (amount, consistency, odor) heavy in photos; appears yellow/slightly blue green, will follow up with staff on this.  Periwound: macerated; I am sure this is related to the amount of drainage.  Dressing procedure/placement/frequency: Cleanse all wounds with Vashe (WD# [phone]), pat dry.  Cover open areas with Drawtex (WD# [phone]), pack ischial wounds with Vashe moist (not soaked) gauze.   Low air loss mattress in place for moisture management and pressure redistibution.   Patient is known to [facility] nursing team he also has a diverting ostomy and SP cath.  LLQ, colostomy 2 piece 2 3/4 skin barrier (WD# [phone]), 2 3/4 pouch (WD# [phone]) and 2 barrier ring (WD# [phone])    Re consult if needed, will not follow at this time. Thanks  Aylah Yeary [employer], RN,CWOCN, CNS, [employer] [phone]

## 2024-10-18 NOTE — ED Notes (Signed)
 Pt repositioned in bed. New mepilex dressings applied to sacral wounds.

## 2024-10-18 NOTE — Progress Notes (Signed)
 [facility] PROGRESS NOTE    Progress Note  Brent [name].  FMW:[id number] DOB: 03-29-1946 DOA: 10/17/2024 PCP: Patient, No Pcp Per     Brief Narrative:   Brent Betsch. is an 79 y.o. 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 Assessment/Plan:   Atypical chest pain His pain is more likely epigastric lower sternum pain. Cardiac biomarkers are flat, twelve-lead EKG T wave inversions in lead I and aVL no reciprocal changes of the lateral leads unremarkable. Agree with continuing beta-blocker and antiplatelet agent. 2D echo is pending.  Cardiology has been notified.  Pulmonary edema possibly due to acute HFrEF: Started on IV Lasix , I's and O's poorly recorded. Try to potassium greater than 4 magnesium  greater than 2.  Possible UTI in the setting of neurogenic bladder: Foley bag appears cloudy with a lot of sediment. Ahead and start him on IV Rocephin . Send urine culture.  Controlled type 2 diabetes mellitus with hyperglycemia, with long-term current use of insulin   Last hemoglobin A1c was 7.1. Continue long-acting insulin  plus sliding scale.  Iron  deficiency anemia: Hemoglobin of 8, MCV borderline RDW elevated. Will need iron  therapy as an outpatient.  History of depression: Continue trazodone , Cymbalta  and Lamictal .  Chronic pain: Continue baclofen  and oxycodone .  Obstructive sleep apnea: Continue CPAP at night.  Neuromyelitis optica disorder: With quadriparesis, colostomy and suprapubic catheter on Rituxan .   Essential hypertension  Obstructive sleep apnea Continue CPAP.  Sacral ulcer, with necrosis of muscle (HCC) stage IV present on admission: Turn patient every 2 hours. Consult wound care.  DVT  prophylaxis: lovenox  Family Communication:none Status is: Observation The patient remains OBS appropriate and will d/c before 2 midnights.    Code Status:     Code Status Orders  (From admission, onward)           Start     Ordered   10/17/24 2351  Full code  Continuous       Question:  By:  Answer:  Consent: discussion documented in EHR   10/17/24 2351           Code Status History     Date Active Date Inactive Code Status Order ID Comments User Context   06/15/2024 1832 06/24/2024 2255 Full Code [id number]  Waddell Rake, MD ED   07/31/2023 0022 08/08/2023 2022 Full Code [id number]  Emilio Norleen BIRCH, PA-C ED   07/19/2023 1652 07/27/2023 1858 Full Code [id number]  Claudene Maximino LABOR, MD Inpatient   07/14/2023 1514 07/18/2023 0307 Full Code [id number]  Claudene Maximino LABOR, MD ED   06/30/2023 1720 07/06/2023 2348 Full Code [id number]  Moody Alto, MD ED   06/05/2023 2100 06/06/2023 2020 Full Code [id number]  Lee Kingfisher, MD ED   05/21/2023 1528 06/01/2023 0403 Full Code [id number]  Trixie Nilda HERO, MD ED   02/21/2023 0914 02/25/2023 1652 Full Code [id number]  Claudene Maximino LABOR, MD ED   02/21/2023 0523 02/21/2023 0914 Full Code [id number]  Howerter, Eva NOVAK, DO ED   03/25/2022 2125 03/27/2022 0112 Full Code [id number]  Lonzell Emeline HERO, DO ED   08/06/2021 0432 08/10/2021 2207 Full Code [id number]  Charlton Evalene RAMAN, MD ED   12/21/2020 2252 12/26/2020 1730 Full Code [id number]  Alfornia Madison, MD ED   11/16/2020 1333 11/22/2020 2059 DNR  [id number]  Kriste Emeline BRAVO, MD ED   11/16/2020 1137 11/16/2020 1333 DNR [id number]  Suzette Pac, MD ED   06/01/2020 1517 06/11/2020 0407 Full Code [id number]  Laurita Cort DASEN, MD ED   03/25/2020 1501 04/24/2020 2250 Full Code [id number]  Pegge Toribio PARAS, PA-C Inpatient   03/25/2020 1501 03/25/2020 1501 Full Code [id number]  Pegge Toribio PARAS, PA-C Inpatient   03/08/2020 1744 03/25/2020 1457 Full Code [id number]  Laurita Cort DASEN, MD ED      Advance Directive Documentation    Flowsheet Row  Most Recent Value  Type of Advance Directive Out of facility DNR (pink MOST or yellow form)  Pre-existing out of facility DNR order (yellow form or pink MOST form) --  MOST Form in Place? --      IV Access:   Peripheral IV   Procedures and diagnostic studies:   DG Chest 2 View Result Date: 10/17/2024 CLINICAL DATA:  Worsening chest pressure EXAM: DG CHEST 2V COMPARISON:  Chest radiograph dated 06/15/2024 FINDINGS: Normal lung volumes. Bilateral interstitial opacities. Bibasilar patchy opacities. No pneumothorax. Trace bilateral pleural effusions. Enlarged cardiomediastinal silhouette. No acute osseous abnormality. IMPRESSION: 1. Cardiomegaly with pulmonary edema and trace bilateral pleural effusions. 2. Bibasilar patchy opacities, likely atelectasis. Electronically Signed   By: Limin  Xu M.D.   On: 10/17/2024 12:41     Medical Consultants:   None.   Subjective:    Brent [name]. relates more epigastric pain and chest pain and some suprapubic pain.  Foley back appears cloudy  Objective:    Vitals:   10/18/24 0045 10/18/24 0100 10/18/24 0300 10/18/24 0615  BP:  (!) 106/38 (!) 102/37 (!) 103/53  Pulse: 71 70 70 71  Resp: 17 12 13 17   Temp:    98.4 F (36.9 C)  TempSrc:    Oral  SpO2: 97% 99% 100% 99%  Weight:      Height:       SpO2: 99 %  No intake or output data in the 24 hours ending 10/18/24 0650 Filed Weights   10/17/24 1106  Weight: 81.6 kg    Exam: General exam: In no acute distress.  Laying comfortably in bed Respiratory system: Good air movement and clear to auscultation. Cardiovascular system: S1 & S2 heard, RRR. No JVD Gastrointestinal system: Abdomen is nondistended, soft and nontender.  Extremities: No pedal edema. Skin: No rashes, lesions or ulcers Psychiatry: Judgement and insight appear normal. Mood & affect appropriate.    Data Reviewed:    Labs: Basic Metabolic Panel: Recent Labs  Lab 10/17/24 1144 10/18/24 0307  NA 135 135   K 4.5 4.1  CL 101 99  CO2 23 25  GLUCOSE 147* 160*  BUN 18 17  CREATININE 0.59* 0.66  CALCIUM  8.4* 8.7*   GFR Estimated Creatinine Clearance: 80.8 mL/min (by C-G formula based on SCr of 0.66 mg/dL). Liver Function Tests: Recent Labs  Lab 10/18/24 0307  AST 24  ALT 17  ALKPHOS 108  BILITOT 0.4  PROT 6.9  ALBUMIN  2.8*   No results for input(s): LIPASE, AMYLASE in the last 168 hours. No results for input(s): AMMONIA in the last 168 hours. Coagulation profile No results for input(s): INR, PROTIME in the last 168 hours. COVID-19 Labs  No results for input(s): DDIMER, FERRITIN, LDH, CRP in the last 72 hours.  Lab Results  Component Value Date   SARSCOV2NAA NEGATIVE 06/15/2024   SARSCOV2NAA NEGATIVE 07/30/2023   SARSCOV2NAA NEGATIVE 07/19/2023   SARSCOV2NAA NEGATIVE 07/14/2023  CBC: Recent Labs  Lab 10/17/24 1144 10/18/24 0307  WBC 11.4* 10.8*  HGB 8.3* 8.8*  HCT 27.3* 29.3*  MCV 81.7 80.7  PLT 261 269   Cardiac Enzymes: No results for input(s): CKTOTAL, CKMB, CKMBINDEX, TROPONINI in the last 168 hours. BNP (last 3 results) Recent Labs    10/17/24 1452  PROBNP 455.0*   CBG: No results for input(s): GLUCAP in the last 168 hours. D-Dimer: No results for input(s): DDIMER in the last 72 hours. Hgb A1c: No results for input(s): HGBA1C in the last 72 hours. Lipid Profile: No results for input(s): CHOL, HDL, LDLCALC, TRIG, CHOLHDL, LDLDIRECT in the last 72 hours. Thyroid  function studies: No results for input(s): TSH, T4TOTAL, T3FREE, THYROIDAB in the last 72 hours.  Invalid input(s): FREET3 Anemia work up: No results for input(s): VITAMINB12, FOLATE, FERRITIN, TIBC, IRON , RETICCTPCT in the last 72 hours. Sepsis Labs: Recent Labs  Lab 10/17/24 1144 10/18/24 0307  WBC 11.4* 10.8*   Microbiology No results found for this or any previous visit (from the past 240 hours).   Medications:     ascorbic acid   500 mg Oral Daily   aspirin  EC  81 mg Oral Daily   atenolol   12.5 mg Oral q AM   atorvastatin   40 mg Oral QHS   baclofen   10 mg Oral TID   carboxymethylcellulose  1 drop Both Eyes Daily   cholecalciferol   2,000 Units Oral Daily   DULoxetine   30 mg Oral QHS   enoxaparin  (LOVENOX ) injection  40 mg Subcutaneous Q24H   famotidine   20 mg Oral BID   furosemide   20 mg Oral Daily   insulin  aspart  0-9 Units Subcutaneous TID WC   insulin  glargine  24 Units Subcutaneous BID   lamoTRIgine   50 mg Oral BID   olopatadine   1 drop Both Eyes BID   potassium chloride   10 mEq Oral Daily   ramelteon   8 mg Oral QHS   senna-docusate  1 tablet Oral BID   thiamine   100 mg Oral Daily   traZODone   100 mg Oral QHS   zinc  sulfate (50mg  elemental zinc )  220 mg Oral Daily   Continuous Infusions:    LOS: 0 days   Brent [name]  [facility]  10/18/2024, 6:50 AM

## 2024-10-18 NOTE — Progress Notes (Signed)
" °  Echocardiogram 2D Echocardiogram has been performed.  Koleen KANDICE Popper, RDCS 10/18/2024, 5:20 PM "

## 2024-10-19 ENCOUNTER — Other Ambulatory Visit: Payer: Self-pay | Admitting: Home Health

## 2024-10-19 DIAGNOSIS — R079 Chest pain, unspecified: Secondary | ICD-10-CM | POA: Diagnosis not present

## 2024-10-19 LAB — CBC
HCT: 26.7 % — ABNORMAL LOW (ref 39.0–52.0)
Hemoglobin: 8.2 g/dL — ABNORMAL LOW (ref 13.0–17.0)
MCH: 24.4 pg — ABNORMAL LOW (ref 26.0–34.0)
MCHC: 30.7 g/dL (ref 30.0–36.0)
MCV: 79.5 fL — ABNORMAL LOW (ref 80.0–100.0)
Platelets: 277 10*3/uL (ref 150–400)
RBC: 3.36 MIL/uL — ABNORMAL LOW (ref 4.22–5.81)
RDW: 17.5 % — ABNORMAL HIGH (ref 11.5–15.5)
WBC: 8.4 10*3/uL (ref 4.0–10.5)
nRBC: 0 % (ref 0.0–0.2)

## 2024-10-19 LAB — GLUCOSE, CAPILLARY
Glucose-Capillary: 132 mg/dL — ABNORMAL HIGH (ref 70–99)
Glucose-Capillary: 138 mg/dL — ABNORMAL HIGH (ref 70–99)
Glucose-Capillary: 135 mg/dL — ABNORMAL HIGH (ref 70–99)
Glucose-Capillary: 155 mg/dL — ABNORMAL HIGH (ref 70–99)

## 2024-10-19 LAB — BASIC METABOLIC PANEL WITH GFR
Anion gap: 10 (ref 5–15)
BUN: 19 mg/dL (ref 8–23)
CO2: 24 mmol/L (ref 22–32)
Calcium: 8.1 mg/dL — ABNORMAL LOW (ref 8.9–10.3)
Chloride: 100 mmol/L (ref 98–111)
Creatinine, Ser: 0.84 mg/dL (ref 0.61–1.24)
GFR, Estimated: >60 mL/min
Glucose, Bld: 169 mg/dL — ABNORMAL HIGH (ref 70–99)
Potassium: 3.9 mmol/L (ref 3.5–5.1)
Sodium: 134 mmol/L — ABNORMAL LOW (ref 135–145)

## 2024-10-19 LAB — MAGNESIUM: Magnesium: 1.7 mg/dL (ref 1.7–2.4)

## 2024-10-19 MED ORDER — ONDANSETRON HCL 4 MG/2ML IJ SOLN
4.0000 mg | Freq: Three times a day (TID) | INTRAMUSCULAR | Status: DC | PRN
  Administered 2024-10-19: 4 mg via INTRAVENOUS
  Filled 2024-10-19: qty 2

## 2024-10-19 NOTE — Progress Notes (Signed)
 Outpatient Cardiac stress PET ordered per Dr Kate request, patient has been consented by Dr Kate.

## 2024-10-19 NOTE — Plan of Care (Signed)

## 2024-10-19 NOTE — Progress Notes (Signed)
 [facility] PROGRESS NOTE    Progress Note  Brent [name].  FMW:[id number] DOB: 12-30-1945 DOA: 10/17/2024 PCP: Patient, No Pcp Per     Brief Narrative:   Jairen Goldfarb. is an 79 y.o. 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 Assessment/Plan:   Atypical chest pain His pain is more likely epigastric lower sternum pain. Cardiac biomarkers are flat, twelve-lead EKG T wave inversions in lead I and aVL no reciprocal changes of the lateral leads unremarkable. Agree with continuing beta-blocker and antiplatelet agent. 2D echo with EF of 55% unchanged from previous no regional wall motion abnormality. Can probably discharge home and have ischemic evaluation as an outpatient. Consult TOC to try to get him back to his skilled nursing facility.  Pulmonary edema possibly due to acute HFrEF: Started on IV Lasix , I's and O's poorly recorded. Try to potassium greater than 4 magnesium  greater than 2. Has been weaned to room air and transition to oral Lasix .  Possible UTI in the setting of neurogenic bladder: Foley bag appears cloudy with a lot of sediment. Continue IV Rocephin  send urinalysis.  Controlled type 2 diabetes mellitus with hyperglycemia, with long-term current use of insulin   Last hemoglobin A1c was 7.1. Continue long-acting insulin  plus sliding scale.  Iron  deficiency anemia: Hemoglobin of 8, MCV borderline RDW elevated. Will need iron  therapy as an outpatient.  History of depression: Continue trazodone , Cymbalta  and Lamictal .  Chronic pain: Continue baclofen  and oxycodone .  Obstructive sleep apnea: Continue CPAP at night.  Neuromyelitis optica disorder: With quadriparesis, colostomy and suprapubic catheter on  Rituxan .   Essential hypertension  Obstructive sleep apnea Continue CPAP.  Sacral ulcer, with necrosis of muscle (HCC) stage IV present on admission: Turn patient every 2 hours. Consult wound care.  DVT prophylaxis: lovenox  Family Communication:none Status is: Observation The patient remains OBS appropriate and will d/c before 2 midnights.    Code Status:     Code Status Orders  (From admission, onward)           Start     Ordered   10/17/24 2351  Full code  Continuous       Question:  By:  Answer:  Consent: discussion documented in EHR   10/17/24 2351           Code Status History     Date Active Date Inactive Code Status Order ID Comments User Context   06/15/2024 1832 06/24/2024 2255 Full Code [id number]  Waddell Rake, MD ED   07/31/2023 0022 08/08/2023 2022 Full Code [id number]  Emilio Norleen BIRCH, PA-C ED   07/19/2023 1652 07/27/2023 1858 Full Code [id number]  Claudene Maximino LABOR, MD Inpatient   07/14/2023 1514 07/18/2023 0307 Full Code [id number]  Claudene Maximino LABOR, MD ED   06/30/2023 1720 07/06/2023 2348 Full Code [id number]  Moody Alto, MD ED   06/05/2023 2100 06/06/2023 2020 Full Code [id number]  Lee Kingfisher, MD ED   05/21/2023 1528 06/01/2023 0403 Full Code [id number]  Trixie Nilda HERO, MD ED   02/21/2023 0914 02/25/2023 1652 Full Code [id number]  Claudene Maximino LABOR, MD ED   02/21/2023 0523 02/21/2023 0914 Full Code [id number]  Marcene Eva NOVAK, DO ED   03/25/2022 2125 03/27/2022 0112 Full Code [id number]  Lonzell Emeline HERO, DO ED  08/06/2021 0432 08/10/2021 2207 Full Code [id number]  Charlton Evalene RAMAN, MD ED   12/21/2020 2252 12/26/2020 1730 Full Code [id number]  Alfornia Madison, MD ED   11/16/2020 1333 11/22/2020 2059 DNR [id number]  Kriste Emeline BRAVO, MD ED   11/16/2020 1137 11/16/2020 1333 DNR [id number]  Suzette Pac, MD ED   06/01/2020 1517 06/11/2020 0407 Full Code [id number]  Laurita Cort DASEN, MD ED   03/25/2020 1501 04/24/2020 2250 Full Code [id number]  Pegge Toribio PARAS, PA-C Inpatient    03/25/2020 1501 03/25/2020 1501 Full Code [id number]  Pegge Toribio PARAS, PA-C Inpatient   03/08/2020 1744 03/25/2020 1457 Full Code [id number]  Laurita Cort DASEN, MD ED      Advance Directive Documentation    Flowsheet Row Most Recent Value  Type of Advance Directive Out of facility DNR (pink MOST or yellow form)  Pre-existing out of facility DNR order (yellow form or pink MOST form) --  MOST Form in Place? --      IV Access:   Peripheral IV   Procedures and diagnostic studies:   ECHOCARDIOGRAM COMPLETE Result Date: 10/18/2024    ECHOCARDIOGRAM REPORT   Patient Name:   Brent [name]. Date of Exam: 10/18/2024 Medical Rec #:  [id number]           Height:       72.0 in Accession #:    [id number]          Weight:       180.0 lb Date of Birth:  12-12-1945          BSA:          2.037 m Patient Age:    78 years            BP:           142/52 mmHg Patient Gender: M                   HR:           64 bpm. Exam Location:  Inpatient Procedure: 2D Echo, Cardiac Doppler and Color Doppler (Both Spectral and Color            Flow Doppler were utilized during procedure). Indications:    Congestive Heart Failure I50.9  History:        Patient has prior history of Echocardiogram examinations, most                 recent 07/15/2023. CAD; Risk Factors:Hypertension, Diabetes,                 Dyslipidemia, Sleep Apnea, GERD and Former Smoker.  Sonographer:    Koleen Popper RDCS Referring Phys: [id number] [facility] K LE IMPRESSIONS  1. Left ventricular ejection fraction, by estimation, is 55 to 60%. Left ventricular ejection fraction by 2D MOD biplane is 60.2 %. The left ventricle has normal function. The left ventricle has no regional wall motion abnormalities. There is mild asymmetric left ventricular hypertrophy of the basal-septal segment. Left ventricular diastolic parameters were normal.  2. Right ventricular systolic function is normal. The right ventricular size is normal. There is normal pulmonary artery systolic  pressure.  3. The mitral valve is normal in structure. Mild mitral valve regurgitation. No evidence of mitral stenosis.  4. The aortic valve is tricuspid. Aortic valve regurgitation is not visualized.  5. The inferior vena cava is normal in size with greater than 50% respiratory variability, suggesting right atrial pressure of 3 mmHg. Comparison(s): No significant change  from prior study. FINDINGS  Left Ventricle: Left ventricular ejection fraction, by estimation, is 55 to 60%. Left ventricular ejection fraction by 2D MOD biplane is 60.2 %. The left ventricle has normal function. The left ventricle has no regional wall motion abnormalities. The left ventricular internal cavity size was normal in size. There is mild asymmetric left ventricular hypertrophy of the basal-septal segment. Left ventricular diastolic parameters were normal. Right Ventricle: The right ventricular size is normal. No increase in right ventricular wall thickness. Right ventricular systolic function is normal. There is normal pulmonary artery systolic pressure. The tricuspid regurgitant velocity is 2.49 m/s, and  with an assumed right atrial pressure of 3 mmHg, the estimated right ventricular systolic pressure is 27.8 mmHg. Left Atrium: Left atrial size was normal in size. Right Atrium: Right atrial size was normal in size. Pericardium: There is no evidence of pericardial effusion. Mitral Valve: The mitral valve is normal in structure. Mild mitral valve regurgitation. No evidence of mitral valve stenosis. Tricuspid Valve: The tricuspid valve is normal in structure. Tricuspid valve regurgitation is mild . No evidence of tricuspid stenosis. Aortic Valve: The aortic valve is tricuspid. Aortic valve regurgitation is not visualized. Pulmonic Valve: The pulmonic valve was not well visualized. Pulmonic valve regurgitation is not visualized. No evidence of pulmonic stenosis. Aorta: The aortic root is normal in size and structure. Venous: The inferior  vena cava is normal in size with greater than 50% respiratory variability, suggesting right atrial pressure of 3 mmHg. IAS/Shunts: No atrial level shunt detected by color flow Doppler.  LEFT VENTRICLE PLAX 2D                        Biplane EF (MOD) LVIDd:         5.70 cm         LV Biplane EF:   Left LVIDs:         3.60 cm                          ventricular LV PW:         0.90 cm                          ejection LV IVS:        1.20 cm                          fraction by LVOT diam:     2.10 cm                          2D MOD LV SV:         65                               biplane is LV SV Index:   32                               60.2 %. LVOT Area:     3.46 cm                                Diastology  LV e' medial:    6.09 cm/s LV Volumes (MOD)               LV E/e' medial:  10.3 LV vol d, MOD    105.0 ml      LV e' lateral:   10.40 cm/s A2C:                           LV E/e' lateral: 6.0 LV vol d, MOD    114.0 ml A4C: LV vol s, MOD    45.9 ml A2C: LV vol s, MOD    42.6 ml A4C: LV SV MOD A2C:   59.1 ml LV SV MOD A4C:   114.0 ml LV SV MOD BP:    66.9 ml RIGHT VENTRICLE            IVC RV Basal diam:  3.70 cm    IVC diam: 1.70 cm RV S prime:     9.81 cm/s TAPSE (M-mode): 1.9 cm LEFT ATRIUM             Index        RIGHT ATRIUM           Index LA diam:        3.20 cm 1.57 cm/m   RA Area:     14.00 cm LA Vol (A2C):   55.9 ml 27.44 ml/m  RA Volume:   33.20 ml  16.30 ml/m LA Vol (A4C):   45.9 ml 22.53 ml/m LA Biplane Vol: 53.0 ml 26.02 ml/m  AORTIC VALVE LVOT Vmax:   81.70 cm/s LVOT Vmean:  53.800 cm/s LVOT VTI:    0.188 m  AORTA Ao Root diam: 3.00 cm MITRAL VALVE               TRICUSPID VALVE MV Area (PHT): 2.95 cm    TR Peak grad:   24.8 mmHg MV Decel Time: 257 msec    TR Vmax:        249.00 cm/s MR Peak grad: 84.6 mmHg MR Vmax:      460.00 cm/s  SHUNTS MV E velocity: 62.90 cm/s  Systemic VTI:  0.19 m MV A velocity: 62.50 cm/s  Systemic Diam: 2.10 cm MV E/A ratio:  1.01 Georganna Archer Electronically signed by Georganna Archer Signature Date/Time: 10/18/2024/6:45:09 PM    Final    DG Chest 2 View Result Date: 10/17/2024 CLINICAL DATA:  Worsening chest pressure EXAM: DG CHEST 2V COMPARISON:  Chest radiograph dated 06/15/2024 FINDINGS: Normal lung volumes. Bilateral interstitial opacities. Bibasilar patchy opacities. No pneumothorax. Trace bilateral pleural effusions. Enlarged cardiomediastinal silhouette. No acute osseous abnormality. IMPRESSION: 1. Cardiomegaly with pulmonary edema and trace bilateral pleural effusions. 2. Bibasilar patchy opacities, likely atelectasis. Electronically Signed   By: Limin  Xu M.D.   On: 10/17/2024 12:41     Medical Consultants:   None.   Subjective:    Brent [name]. no complaints this morning sleepy.  Objective:    Vitals:   10/18/24 2037 10/18/24 2349 10/19/24 0351 10/19/24 0736  BP: (!) 137/50 (!) 107/48 (!) 109/50 (!) 93/43  Pulse: 71 63 (!) 59   Resp: 18 20 14 14   Temp: 98.8 F (37.1 C) 98.7 F (37.1 C) 98.2 F (36.8 C) (!) 97.5 F (36.4 C)  TempSrc: Oral Oral Oral Oral  SpO2: 98% 93% 97% 99%  Weight:      Height:       SpO2: 99 %  Intake/Output Summary (Last 24 hours) at 10/19/2024 0816 Last data filed at 10/19/2024 0500 Gross per 24 hour  Intake 1313 ml  Output 650 ml  Net 663 ml   Filed Weights   10/17/24 1106  Weight: 81.6 kg    Exam: General exam: In no acute distress. Respiratory system: Good air movement and clear to auscultation. Cardiovascular system: S1 & S2 heard, RRR. No JVD. Gastrointestinal system: Abdomen is nondistended, soft and nontender.  Extremities: No pedal edema. Skin: No rashes, lesions or ulcers Psychiatry: Judgement and insight appear normal. Mood & affect appropriate.  Data Reviewed:    Labs: Basic Metabolic Panel: Recent Labs  Lab 10/17/24 1144 10/18/24 0307 10/19/24 0416  NA 135 135 134*  K 4.5 4.1 3.9  CL 101 99 100  CO2 23 25 24   GLUCOSE 147* 160*  169*  BUN 18 17 19   CREATININE 0.59* 0.66 0.84  CALCIUM  8.4* 8.7* 8.1*  MG  --   --  1.7   GFR Estimated Creatinine Clearance: 77 mL/min (by C-G formula based on SCr of 0.84 mg/dL). Liver Function Tests: Recent Labs  Lab 10/18/24 0307  AST 24  ALT 17  ALKPHOS 108  BILITOT 0.4  PROT 6.9  ALBUMIN  2.8*   No results for input(s): LIPASE, AMYLASE in the last 168 hours. No results for input(s): AMMONIA in the last 168 hours. Coagulation profile No results for input(s): INR, PROTIME in the last 168 hours. COVID-19 Labs  No results for input(s): DDIMER, FERRITIN, LDH, CRP in the last 72 hours.  Lab Results  Component Value Date   SARSCOV2NAA NEGATIVE 06/15/2024   SARSCOV2NAA NEGATIVE 07/30/2023   SARSCOV2NAA NEGATIVE 07/19/2023   SARSCOV2NAA NEGATIVE 07/14/2023    CBC: Recent Labs  Lab 10/17/24 1144 10/18/24 0307  WBC 11.4* 10.8*  HGB 8.3* 8.8*  HCT 27.3* 29.3*  MCV 81.7 80.7  PLT 261 269   Cardiac Enzymes: No results for input(s): CKTOTAL, CKMB, CKMBINDEX, TROPONINI in the last 168 hours. BNP (last 3 results) Recent Labs    10/17/24 1452  PROBNP 455.0*   CBG: Recent Labs  Lab 10/18/24 0750 10/18/24 1155 10/18/24 1715 10/18/24 2120 10/19/24 0737  GLUCAP 161* 177* 128* 185* 132*   D-Dimer: No results for input(s): DDIMER in the last 72 hours. Hgb A1c: No results for input(s): HGBA1C in the last 72 hours. Lipid Profile: No results for input(s): CHOL, HDL, LDLCALC, TRIG, CHOLHDL, LDLDIRECT in the last 72 hours. Thyroid  function studies: No results for input(s): TSH, T4TOTAL, T3FREE, THYROIDAB in the last 72 hours.  Invalid input(s): FREET3 Anemia work up: No results for input(s): VITAMINB12, FOLATE, FERRITIN, TIBC, IRON , RETICCTPCT in the last 72 hours. Sepsis Labs: Recent Labs  Lab 10/17/24 1144 10/18/24 0307  WBC 11.4* 10.8*   Microbiology No results found for this or any  previous visit (from the past 240 hours).   Medications:    ascorbic acid   500 mg Oral Daily   aspirin  EC  81 mg Oral Daily   atenolol   12.5 mg Oral q AM   atorvastatin   40 mg Oral QHS   baclofen   10 mg Oral TID   cholecalciferol   2,000 Units Oral Daily   DULoxetine   30 mg Oral QHS   enoxaparin  (LOVENOX ) injection  40 mg Subcutaneous Q24H   famotidine   20 mg Oral BID   furosemide   20 mg Oral Daily   insulin  aspart  0-9 Units Subcutaneous TID WC   insulin  glargine  24 Units Subcutaneous BID  lamoTRIgine   50 mg Oral BID   olopatadine   1 drop Both Eyes BID   potassium chloride   10 mEq Oral Daily   ramelteon   8 mg Oral QHS   senna-docusate  1 tablet Oral BID   thiamine   100 mg Oral Daily   traZODone   100 mg Oral QHS   zinc  sulfate (50mg  elemental zinc )  220 mg Oral Daily   Continuous Infusions:  cefTRIAXone  (ROCEPHIN )  IV 1 g (10/19/24 0805)      LOS: 1 day   Erle Odell Castor  [facility]  10/19/2024, 8:16 AM

## 2024-10-19 NOTE — Discharge Instructions (Signed)

## 2024-10-19 NOTE — Care Management (Signed)
 SDOH resources added to AVS

## 2024-10-20 ENCOUNTER — Encounter (HOSPITAL_COMMUNITY): Payer: Self-pay | Admitting: Internal Medicine

## 2024-10-20 ENCOUNTER — Other Ambulatory Visit (HOSPITAL_COMMUNITY): Payer: Self-pay

## 2024-10-20 DIAGNOSIS — Z794 Long term (current) use of insulin: Secondary | ICD-10-CM | POA: Diagnosis not present

## 2024-10-20 DIAGNOSIS — E1165 Type 2 diabetes mellitus with hyperglycemia: Secondary | ICD-10-CM | POA: Diagnosis not present

## 2024-10-20 DIAGNOSIS — L89154 Pressure ulcer of sacral region, stage 4: Secondary | ICD-10-CM

## 2024-10-20 DIAGNOSIS — R079 Chest pain, unspecified: Secondary | ICD-10-CM | POA: Diagnosis not present

## 2024-10-20 LAB — BASIC METABOLIC PANEL WITH GFR
Anion gap: 10 (ref 5–15)
BUN: 17 mg/dL (ref 8–23)
CO2: 21 mmol/L — ABNORMAL LOW (ref 22–32)
Calcium: 8.1 mg/dL — ABNORMAL LOW (ref 8.9–10.3)
Chloride: 101 mmol/L (ref 98–111)
Creatinine, Ser: 0.7 mg/dL (ref 0.61–1.24)
GFR, Estimated: >60 mL/min
Glucose, Bld: 163 mg/dL — ABNORMAL HIGH (ref 70–99)
Potassium: 4.2 mmol/L (ref 3.5–5.1)
Sodium: 133 mmol/L — ABNORMAL LOW (ref 135–145)

## 2024-10-20 LAB — GLUCOSE, CAPILLARY
Glucose-Capillary: 127 mg/dL — ABNORMAL HIGH (ref 70–99)
Glucose-Capillary: 211 mg/dL — ABNORMAL HIGH (ref 70–99)
Glucose-Capillary: 203 mg/dL — ABNORMAL HIGH (ref 70–99)
Glucose-Capillary: 310 mg/dL — ABNORMAL HIGH (ref 70–99)

## 2024-10-20 MED ORDER — CEFPODOXIME PROXETIL 200 MG PO TABS: 200.0000 mg | ORAL_TABLET | Freq: Two times a day (BID) | ORAL | Status: AC

## 2024-10-20 MED ORDER — OXYCODONE HCL 5 MG PO TABS: 5.0000 mg | ORAL_TABLET | Freq: Four times a day (QID) | ORAL | 0 refills | Status: AC | PRN

## 2024-10-20 MED ORDER — ASPIRIN 81 MG PO TBEC
81.0000 mg | DELAYED_RELEASE_TABLET | Freq: Every day | ORAL | 12 refills | Status: AC
  Filled 2024-10-20: qty 30, 30d supply, fill #0

## 2024-10-20 NOTE — Plan of Care (Signed)
" °  Problem: Education: Goal: Ability to describe self-care measures that may prevent or decrease complications (Diabetes Survival Skills Education) will improve 10/20/2024 0313 by Jorden Suzen LABOR, RN Outcome: Progressing 10/20/2024 0313 by Jorden Suzen LABOR, RN Outcome: Progressing Goal: Individualized Educational Video(s) 10/20/2024 0313 by Jorden Suzen LABOR, RN Outcome: Progressing 10/20/2024 0313 by Jorden Suzen LABOR, RN Outcome: Progressing   Problem: Coping: Goal: Ability to adjust to condition or change in health will improve 10/20/2024 0313 by Jorden Suzen LABOR, RN Outcome: Progressing 10/20/2024 0313 by Jorden Suzen LABOR, RN Outcome: Progressing   Problem: Fluid Volume: Goal: Ability to maintain a balanced intake and output will improve 10/20/2024 0313 by Jorden Suzen LABOR, RN Outcome: Progressing 10/20/2024 0313 by Jorden Suzen LABOR, RN Outcome: Progressing   Problem: Health Behavior/Discharge Planning: Goal: Ability to identify and utilize available resources and services will improve 10/20/2024 0313 by Jorden Suzen LABOR, RN Outcome: Progressing 10/20/2024 0313 by Jorden Suzen LABOR, RN Outcome: Progressing Goal: Ability to manage health-related needs will improve Outcome: Progressing   Problem: Metabolic: Goal: Ability to maintain appropriate glucose levels will improve Outcome: Progressing   Problem: Nutritional: Goal: Maintenance of adequate nutrition will improve Outcome: Progressing Goal: Progress toward achieving an optimal weight will improve Outcome: Progressing   Problem: Skin Integrity: Goal: Risk for impaired skin integrity will decrease Outcome: Progressing   Problem: Tissue Perfusion: Goal: Adequacy of tissue perfusion will improve Outcome: Progressing   Problem: Education: Goal: Knowledge of General Education information will improve Description: Including pain rating scale, medication(s)/side effects and non-pharmacologic comfort measures Outcome: Progressing    Problem: Health Behavior/Discharge Planning: Goal: Ability to manage health-related needs will improve Outcome: Progressing   Problem: Clinical Measurements: Goal: Ability to maintain clinical measurements within normal limits will improve Outcome: Progressing Goal: Will remain free from infection Outcome: Progressing Goal: Diagnostic test results will improve Outcome: Progressing Goal: Respiratory complications will improve Outcome: Progressing Goal: Cardiovascular complication will be avoided Outcome: Progressing   Problem: Activity: Goal: Risk for activity intolerance will decrease Outcome: Progressing   Problem: Nutrition: Goal: Adequate nutrition will be maintained Outcome: Progressing   Problem: Coping: Goal: Level of anxiety will decrease Outcome: Progressing   Problem: Elimination: Goal: Will not experience complications related to bowel motility Outcome: Progressing Goal: Will not experience complications related to urinary retention Outcome: Progressing   Problem: Pain Managment: Goal: General experience of comfort will improve and/or be controlled Outcome: Progressing   Problem: Safety: Goal: Ability to remain free from injury will improve Outcome: Progressing   Problem: Skin Integrity: Goal: Risk for impaired skin integrity will decrease Outcome: Progressing   "

## 2024-10-20 NOTE — Discharge Summary (Signed)
 Physician Discharge Summary  Brent [name]. FMW:[id number] DOB: [birth year] DOA: 10/17/2024  PCP: Patient, No Pcp Per  Admit date: 10/17/2024 Discharge date: 10/20/2024  Admitted From: SNF Disposition:  SNF   Recommendations for Outpatient Follow-up:  Follow up with PCP in 1-2 weeks   Home Health:No Equipment/Devices:None  Discharge Condition:Guarded CODE STATUS:Full Diet recommendation: Heart Healthy   Brief/Interim Summary:  79 y.o. 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   Discharge Diagnoses:  Principal Problem:   Chest pain Active Problems:   Paraplegia (HCC)   Controlled type 2 diabetes mellitus with hyperglycemia, with long-term current use of insulin  (HCC)   Stage IV pressure ulcer of sacral region [facility])   Essential hypertension   CAD in native artery   Neurogenic bladder   Hyperlipidemia   GERD (gastroesophageal reflux disease)   Colostomy present (HCC)   Obstructive sleep apnea   Diabetic peripheral neuropathy (HCC)   Sacral ulcer, with necrosis of muscle (HCC)   Benign neuroendocrine tumor of stomach    Anemia   Acute on chronic diastolic heart failure (HCC)  Atypical chest pain: More like epigastric and lower sternum, cardiac biomarkers are flat twelve-lead EKG showed T wave inversions in V1 and aVL. Cardiology was consulted recommended conservative management continue beta-blockers and antiplatelet therapy. He will probably need ischemic workup evaluation as an outpatient.  Pulmonary edema possibly due to acute HFrEF: Started on IV Lasix  there is about a liter transition to oral Lasix .  Possible UTI in the setting of neurogenic bladder. Started on IV Rocephin  will treat empirically, changed to oral Vantin   will continue as an outpatient.  Controlled diabetes mellitus type 2 with hyperglycemia: Adjustment to his medication continue current regimen.  Iron  deficiency anemia: Will start iron  therapy as an outpatient.  History of depression: Continue trazodone  Cymbalta  and Lamictal .  Chronic back pain: Continue baclofen  and oxycodone .  Started CPAP Nickel Continue CPAP at night.  Neuromyelitis optica disorder: With quadriparesis colostomy and suprapubic catheter. Continue Rituxan .  Essential hypertension: Change made to his medication.  Sacral decubitus ulcer with necrosis: Wound care was consulted recommended chemical debridement which will continue at facility.  Discharge Instructions  Discharge Instructions     Increase activity slowly   Complete by: As directed    No wound care   Complete by: As directed       Allergies as of 10/20/2024       Reactions   Cipro  [ciprofloxacin  Hcl] Other (See Comments)   Hallucinations   Neurontin  [gabapentin ] Other (See Comments)   Hallucinations   Levaquin  [levofloxacin ] Rash   Penicillins Hives, Rash, Other (See Comments)   Tolerated Unasyn , Augmentin , cefepime  and cephalexin         Medication List     STOP taking these medications    sulfamethoxazole -trimethoprim  800-160 MG tablet Commonly known as: BACTRIM  DS       TAKE these medications    acetaminophen  500 MG tablet Commonly known as: TYLENOL  Take 2 tablets (1,000 mg total) by mouth every 6 (six) hours as needed for mild pain (or Fever >/= 101).   acetic acid 0.25 % irrigation Irrigate with 1 Application as directed 2 (two) times daily. 10 ml   albuterol  1.25 MG/3ML nebulizer solution Commonly known as: ACCUNEB  Take 1 ampule by  nebulization 2 (two) times daily as needed (for coughing).   Artificial Tears 1 % ophthalmic solution Generic drug: carboxymethylcellulose Place 1 drop into both eyes daily.   ascorbic acid  500 MG tablet Commonly known as:  VITAMIN C  Take 500 mg by mouth daily.   aspirin  EC 81 MG tablet Take 1 tablet (81 mg total) by mouth daily. Swallow whole.   atenolol  25 MG tablet Commonly known as: TENORMIN  Take 12.5 mg by mouth in the morning.   atorvastatin  40 MG tablet Commonly known as: LIPITOR Take 1 tablet (40 mg total) by mouth at bedtime.   azelastine  0.1 % nasal spray Commonly known as: ASTELIN  Place 2 sprays into both nostrils daily.   baclofen  10 MG tablet Commonly known as: LIORESAL  Take 10 mg by mouth 3 (three) times daily.   Biofreeze 4 % Gel Generic drug: Menthol  (Topical Analgesic) Apply 1 application  topically 2 (two) times daily as needed (pain to back, upper and lower arms, and shoulders).   calcium  carbonate 500 MG chewable tablet Commonly known as: TUMS - dosed in mg elemental calcium  Chew 1,000 mg by mouth every 4 (four) hours as needed for indigestion.   cholecalciferol  25 MCG (1000 UNIT) tablet Commonly known as: VITAMIN D3 Take 2,000 Units by mouth daily.   Delsym 30 MG/5ML liquid Generic drug: dextromethorphan Take 60 mg by mouth 2 (two) times daily as needed for cough.   DULoxetine  30 MG capsule Commonly known as: CYMBALTA  Take 1 capsule (30 mg total) by mouth at bedtime.   famotidine  20 MG tablet Commonly known as: PEPCID  Take 20 mg by mouth 2 (two) times daily.   ferrous sulfate  325 (65 FE) MG tablet Take 1 tablet (325 mg total) by mouth every other day. What changed: when to take this   fluticasone  50 MCG/ACT nasal spray Commonly known as: FLONASE  Place 1 spray into both nostrils in the morning and at bedtime.   furosemide  20 MG tablet Commonly known as: LASIX  Take 20 mg by mouth daily.   guaiFENesin  600 MG 12 hr tablet Commonly known as: MUCINEX  Take 600 mg by mouth 2 (two) times daily.   HumaLOG 100 UNIT/ML injection Generic drug: insulin  lispro Inject 0-12 Units into the skin 4 (four) times daily - after meals and at bedtime. 70-200 0 units  201-250  2 units  251-300 4 units  301-350 6 units  351-400 8 units  401-450 10 units  451-600 12 units   insulin  glargine-yfgn 100 UNIT/ML Pen Commonly known as: Semglee  (yfgn) Inject 24 Units into the skin 2 (two) times daily. What changed: how much to take   ipratropium-albuterol  0.5-2.5 (3) MG/3ML Soln Commonly known as: DUONEB Take 3 mLs by nebulization every 4 (four) hours as needed (wheezing/sob).   lamoTRIgine  25 MG tablet Commonly known as: LAMICTAL  Take 50 mg by mouth 2 (two) times daily.   loratadine  10 MG tablet Commonly known as: CLARITIN  Take 10 mg by mouth daily.   metFORMIN  500 MG 24 hr tablet Commonly known as: GLUCOPHAGE -XR Take 500 mg by mouth 2 (two) times daily before a meal.   ondansetron  4 MG disintegrating tablet Commonly known as: ZOFRAN -ODT Take 4 mg by mouth every 8 (eight) hours as needed for nausea or vomiting.   oxyCODONE  5 MG immediate release tablet Commonly known as: Oxy IR/ROXICODONE  Take 1 tablet (5 mg total) by mouth every 6 (six) hours as needed for severe pain (pain score 7-10) or moderate pain (pain score 4-6) (for left shoulder pain).  Pataday  0.1 % ophthalmic solution Generic drug: olopatadine  Place 1 drop into both eyes 2 (two) times daily.   phenol 1.4 % Liqd Commonly known as: CHLORASEPTIC Use as directed 2 sprays in the mouth or throat as needed for throat irritation / pain.   polyethylene glycol 17 g packet Commonly known as: MIRALAX  / GLYCOLAX  Take 17 g by mouth daily as needed for mild constipation or moderate constipation.   potassium chloride  10 MEQ tablet Commonly known as: KLOR-CON  Take 10 mEq by mouth daily.   ramelteon  8 MG tablet Commonly known as: ROZEREM  Take 8 mg by mouth at bedtime.   RITUXAN  IV Inject 10 mg into the vein once. Between the 12th-13th of June, December.   senna-docusate 8.6-50 MG tablet Commonly known as: Senokot-S Take 1 tablet by mouth 2 (two) times daily.   thiamine  100 MG  tablet Commonly known as: VITAMIN B1 Take 100 mg by mouth daily.   traZODone  100 MG tablet Commonly known as: DESYREL  Take 100 mg by mouth at bedtime.   zinc  sulfate (50mg  elemental zinc ) 220 (50 Zn) MG capsule Take 1 capsule (220 mg total) by mouth daily.        Allergies[1]  Consultations: Cardiology   Procedures/Studies: ECHOCARDIOGRAM COMPLETE Result Date: 10/18/2024    ECHOCARDIOGRAM REPORT   Patient Name:   Brent [name]. Date of Exam: 10/18/2024 Medical Rec #:  [id number]           Height:       72.0 in Accession #:    [id number]          Weight:       180.0 lb Date of Birth:  [birth year]          BSA:          2.037 m Patient Age:    78 years            BP:           142/52 mmHg Patient Gender: M                   HR:           64 bpm. Exam Location:  Inpatient Procedure: 2D Echo, Cardiac Doppler and Color Doppler (Both Spectral and Color            Flow Doppler were utilized during procedure). Indications:    Congestive Heart Failure I50.9  History:        Patient has prior history of Echocardiogram examinations, most                 recent 07/15/2023. CAD; Risk Factors:Hypertension, Diabetes,                 Dyslipidemia, Sleep Apnea, GERD and Former Smoker.  Sonographer:    Koleen Popper RDCS Referring Phys: [id number] [facility] LE IMPRESSIONS  1. Left ventricular ejection fraction, by estimation, is 55 to 60%. Left ventricular ejection fraction by 2D MOD biplane is 60.2 %. The left ventricle has normal function. The left ventricle has no regional wall motion abnormalities. There is mild asymmetric left ventricular hypertrophy of the basal-septal segment. Left ventricular diastolic parameters were normal.  2. Right ventricular systolic function is normal. The right ventricular size is normal. There is normal pulmonary artery systolic pressure.  3. The mitral valve is normal in structure. Mild mitral valve regurgitation. No evidence of mitral stenosis.  4. The aortic valve is  tricuspid. Aortic valve regurgitation is not  visualized.  5. The inferior vena cava is normal in size with greater than 50% respiratory variability, suggesting right atrial pressure of 3 mmHg. Comparison(s): No significant change from prior study. FINDINGS  Left Ventricle: Left ventricular ejection fraction, by estimation, is 55 to 60%. Left ventricular ejection fraction by 2D MOD biplane is 60.2 %. The left ventricle has normal function. The left ventricle has no regional wall motion abnormalities. The left ventricular internal cavity size was normal in size. There is mild asymmetric left ventricular hypertrophy of the basal-septal segment. Left ventricular diastolic parameters were normal. Right Ventricle: The right ventricular size is normal. No increase in right ventricular wall thickness. Right ventricular systolic function is normal. There is normal pulmonary artery systolic pressure. The tricuspid regurgitant velocity is 2.49 m/s, and  with an assumed right atrial pressure of 3 mmHg, the estimated right ventricular systolic pressure is 27.8 mmHg. Left Atrium: Left atrial size was normal in size. Right Atrium: Right atrial size was normal in size. Pericardium: There is no evidence of pericardial effusion. Mitral Valve: The mitral valve is normal in structure. Mild mitral valve regurgitation. No evidence of mitral valve stenosis. Tricuspid Valve: The tricuspid valve is normal in structure. Tricuspid valve regurgitation is mild . No evidence of tricuspid stenosis. Aortic Valve: The aortic valve is tricuspid. Aortic valve regurgitation is not visualized. Pulmonic Valve: The pulmonic valve was not well visualized. Pulmonic valve regurgitation is not visualized. No evidence of pulmonic stenosis. Aorta: The aortic root is normal in size and structure. Venous: The inferior vena cava is normal in size with greater than 50% respiratory variability, suggesting right atrial pressure of 3 mmHg. IAS/Shunts: No atrial level  shunt detected by color flow Doppler.  LEFT VENTRICLE PLAX 2D                        Biplane EF (MOD) LVIDd:         5.70 cm         LV Biplane EF:   Left LVIDs:         3.60 cm                          ventricular LV PW:         0.90 cm                          ejection LV IVS:        1.20 cm                          fraction by LVOT diam:     2.10 cm                          2D MOD LV SV:         65                               biplane is LV SV Index:   32                               60.2 %. LVOT Area:     3.46 cm  Diastology                                LV e' medial:    6.09 cm/s LV Volumes (MOD)               LV E/e' medial:  10.3 LV vol d, MOD    105.0 ml      LV e' lateral:   10.40 cm/s A2C:                           LV E/e' lateral: 6.0 LV vol d, MOD    114.0 ml A4C: LV vol s, MOD    45.9 ml A2C: LV vol s, MOD    42.6 ml A4C: LV SV MOD A2C:   59.1 ml LV SV MOD A4C:   114.0 ml LV SV MOD BP:    66.9 ml RIGHT VENTRICLE            IVC RV Basal diam:  3.70 cm    IVC diam: 1.70 cm RV S prime:     9.81 cm/s TAPSE (M-mode): 1.9 cm LEFT ATRIUM             Index        RIGHT ATRIUM           Index LA diam:        3.20 cm 1.57 cm/m   RA Area:     14.00 cm LA Vol (A2C):   55.9 ml 27.44 ml/m  RA Volume:   33.20 ml  16.30 ml/m LA Vol (A4C):   45.9 ml 22.53 ml/m LA Biplane Vol: 53.0 ml 26.02 ml/m  AORTIC VALVE LVOT Vmax:   81.70 cm/s LVOT Vmean:  53.800 cm/s LVOT VTI:    0.188 m  AORTA Ao Root diam: 3.00 cm MITRAL VALVE               TRICUSPID VALVE MV Area (PHT): 2.95 cm    TR Peak grad:   24.8 mmHg MV Decel Time: 257 msec    TR Vmax:        249.00 cm/s MR Peak grad: 84.6 mmHg MR Vmax:      460.00 cm/s  SHUNTS MV E velocity: 62.90 cm/s  Systemic VTI:  0.19 m MV A velocity: 62.50 cm/s  Systemic Diam: 2.10 cm MV E/A ratio:  1.01 Georganna Archer Electronically signed by Georganna Archer Signature Date/Time: 10/18/2024/6:45:09 PM    Final    DG Chest 2 View Result Date:  10/17/2024 CLINICAL DATA:  Worsening chest pressure EXAM: DG CHEST 2V COMPARISON:  Chest radiograph dated 06/15/2024 FINDINGS: Normal lung volumes. Bilateral interstitial opacities. Bibasilar patchy opacities. No pneumothorax. Trace bilateral pleural effusions. Enlarged cardiomediastinal silhouette. No acute osseous abnormality. IMPRESSION: 1. Cardiomegaly with pulmonary edema and trace bilateral pleural effusions. 2. Bibasilar patchy opacities, likely atelectasis. Electronically Signed   By: Limin  Xu M.D.   On: 10/17/2024 12:41   (Echo, Carotid, EGD, Colonoscopy, ERCP)    Subjective: No complaints  Discharge Exam: Vitals:   10/19/24 2341 10/20/24 0446  BP: (!) 140/48 (!) 116/46  Pulse: 63 (!) 58  Resp: 15 (!) 21  Temp: 98 F (36.7 C) 97.8 F (36.6 C)  SpO2: 96% 95%   Vitals:   10/19/24 2010 10/19/24 2240 10/19/24 2341 10/20/24 0446  BP: 121/74 (!) 107/43 (!) 140/48 (!) 116/46  Pulse: 61 61 63 (!)  58  Resp: 13 (!) 23 15 (!) 21  Temp: 98.5 F (36.9 C)  98 F (36.7 C) 97.8 F (36.6 C)  TempSrc: Oral  Oral Oral  SpO2: 98% 99% 96% 95%  Weight:      Height:        General: Pt is alert, awake, not in acute distress Cardiovascular: RRR, S1/S2 +, no rubs, no gallops Respiratory: CTA bilaterally, no wheezing, no rhonchi Abdominal: Soft, NT, ND, bowel sounds + Extremities: no edema, no cyanosis    The results of significant diagnostics from this hospitalization (including imaging, microbiology, ancillary and laboratory) are listed below for reference.     Microbiology: No results found for this or any previous visit (from the past 240 hours).   Labs: BNP (last 3 results) No results for input(s): BNP in the last 8760 hours. Basic Metabolic Panel: Recent Labs  Lab 10/17/24 1144 10/18/24 0307 10/19/24 0416 10/20/24 0236  NA 135 135 134* 133*  K 4.5 4.1 3.9 4.2  CL 101 99 100 101  CO2 23 25 24  21*  GLUCOSE 147* 160* 169* 163*  BUN 18 17 19 17   CREATININE 0.59*  0.66 0.84 0.70  CALCIUM  8.4* 8.7* 8.1* 8.1*  MG  --   --  1.7  --    Liver Function Tests: Recent Labs  Lab 10/18/24 0307  AST 24  ALT 17  ALKPHOS 108  BILITOT 0.4  PROT 6.9  ALBUMIN  2.8*   No results for input(s): LIPASE, AMYLASE in the last 168 hours. No results for input(s): AMMONIA in the last 168 hours. CBC: Recent Labs  Lab 10/17/24 1144 10/18/24 0307 10/19/24 2114  WBC 11.4* 10.8* 8.4  HGB 8.3* 8.8* 8.2*  HCT 27.3* 29.3* 26.7*  MCV 81.7 80.7 79.5*  PLT 261 269 277   Cardiac Enzymes: No results for input(s): CKTOTAL, CKMB, CKMBINDEX, TROPONINI in the last 168 hours. BNP: Invalid input(s): POCBNP CBG: Recent Labs  Lab 10/19/24 0737 10/19/24 1120 10/19/24 1657 10/19/24 1956 10/20/24 0758  GLUCAP 132* 138* 135* 155* 127*   D-Dimer No results for input(s): DDIMER in the last 72 hours. Hgb A1c No results for input(s): HGBA1C in the last 72 hours. Lipid Profile No results for input(s): CHOL, HDL, LDLCALC, TRIG, CHOLHDL, LDLDIRECT in the last 72 hours. Thyroid  function studies No results for input(s): TSH, T4TOTAL, T3FREE, THYROIDAB in the last 72 hours.  Invalid input(s): FREET3 Anemia work up No results for input(s): VITAMINB12, FOLATE, FERRITIN, TIBC, IRON , RETICCTPCT in the last 72 hours. Urinalysis    Component Value Date/Time   COLORURINE YELLOW 09/17/2024 2209   APPEARANCEUR TURBID (A) 09/17/2024 2209   LABSPEC 1.015 09/17/2024 2209   PHURINE 5.0 09/17/2024 2209   GLUCOSEU NEGATIVE 09/17/2024 2209   HGBUR MODERATE (A) 09/17/2024 2209   BILIRUBINUR NEGATIVE 09/17/2024 2209   BILIRUBINUR n 03/18/2015 0928   KETONESUR NEGATIVE 09/17/2024 2209   PROTEINUR 100 (A) 09/17/2024 2209   UROBILINOGEN 2.0 03/18/2015 0928   UROBILINOGEN 1.0 07/16/2012 1345   NITRITE NEGATIVE 09/17/2024 2209   LEUKOCYTESUR MODERATE (A) 09/17/2024 2209   Sepsis Labs Recent Labs  Lab 10/17/24 1144 10/18/24 0307  10/19/24 2114  WBC 11.4* 10.8* 8.4   Microbiology No results found for this or any previous visit (from the past 240 hours).   Time coordinating discharge: Over 30 minutes  SIGNED:   Erle Odell Castor, MD  [facility] 10/20/2024, 8:40 AM Pager   If 7PM-7AM, please contact night-coverage [email or web address] Password [email or web address]     [  1]  Allergies Allergen Reactions   Cipro  [Ciprofloxacin  Hcl] Other (See Comments)    Hallucinations   Neurontin  [Gabapentin ] Other (See Comments)    Hallucinations   Levaquin  [Levofloxacin ] Rash   Penicillins Hives, Rash and Other (See Comments)    Tolerated Unasyn , Augmentin , cefepime  and cephalexin 

## 2024-10-20 NOTE — TOC Initial Note (Signed)
 Transition of Care (TOC) - Initial/Assessment Note    Patient Details  Name: Brent [name]. MRN: [id number] Date of Birth: 20-Apr-1946  Transition of Care [facility]) CM/SW Contact:    Olam FORBES Ally, LCSW Phone Number: 10/20/2024, 10:59 AM  Clinical Narrative:                 CSW spoke with patient wife- Cy to confirm that patient was for [facility] and the plan was for him to return. Patient is a LTC resident of [address]. CSW called [facility] to notify them that patient is medically ready for DC today. CSW had to leave voicemail.  ICM team will continue to assist with discharge planning needs.   Expected Discharge Plan: Skilled Nursing Facility Barriers to Discharge: No Barriers Identified   Patient Goals and CMS Choice            Expected Discharge Plan and Services       Living arrangements for the past 2 months: Skilled Nursing Facility Expected Discharge Date: 10/20/24                                    Prior Living Arrangements/Services Living arrangements for the past 2 months: Skilled Nursing Facility Lives with:: Facility Resident                   Activities of Daily Living   ADL Screening (condition at time of admission) Independently performs ADLs?: No Does the patient have a NEW difficulty with bathing/dressing/toileting/self-feeding that is expected to last >3 days?: No Does the patient have a NEW difficulty with getting in/out of bed, walking, or climbing stairs that is expected to last >3 days?: No Does the patient have a NEW difficulty with communication that is expected to last >3 days?: No Is the patient deaf or have difficulty hearing?: No Does the patient have difficulty seeing, even when wearing glasses/contacts?: No Does the patient have difficulty concentrating, remembering, or making decisions?: No  Permission Sought/Granted      Share Information with NAME: spouse- Cy     Permission granted to share info w  Relationship: Spouse  Permission granted to share info w Contact Information: [phone]  Emotional Assessment Appearance:: Other (Comment Required Attitude/Demeanor/Rapport: Unable to Assess Affect (typically observed): Unable to Assess Orientation: : Oriented to Self, Oriented to Place, Oriented to  Time      Admission diagnosis:  Chest pain [R07.9] Chest pain, unspecified type [R07.9] Patient Active Problem List   Diagnosis Date Noted   Acute on chronic diastolic heart failure (HCC) 10/18/2024   Anemia 10/17/2024   Wound infection 06/18/2024   Thigh abscess 06/15/2024   Acute osteomyelitis involving pelvic region and thigh, left (HCC) 06/15/2024   Acute encephalopathy 07/31/2023   Acute respiratory failure (HCC) 07/31/2023   Bilateral pleural effusion 07/26/2023   Acute confusional state 07/26/2023   Pleural effusion 07/19/2023   Leukocytosis 07/19/2023   Sinus bradycardia 07/19/2023   (HFpEF) heart failure with preserved ejection fraction (HCC) 07/14/2023   History of osteomyelitis 07/14/2023   GERD (gastroesophageal reflux disease) 07/14/2023   Diabetic foot ulcer (HCC) 07/01/2023   Acute osteomyelitis of metatarsal bone of right foot (HCC) 07/01/2023   Septic arthritis of foot (HCC) 07/01/2023   Colostomy present (HCC) 07/01/2023   DM type 2 (diabetes mellitus, type 2) (HCC) 07/01/2023   Aortic atherosclerosis 07/01/2023   Pressure ulcer  07/01/2023   Stage IV decubitus ulcer (HCC) 06/30/2023   Urine leukocytes 06/30/2023   SIRS (systemic inflammatory response syndrome) (HCC) 06/05/2023   Suprapubic catheter (HCC) 06/05/2023   Dehydration 02/21/2023   Severe sepsis secondary to acute OM of left ischium and posterior thigh abscess 02/21/2023   Acute metabolic encephalopathy 02/21/2023   Lactic acidosis 02/21/2023   Insulin  dependent type 2 diabetes mellitus (HCC) 02/21/2023   Normocytic anemia 02/21/2023   Primary malignant neuroendocrine tumor of stomach (HCC)  05/27/2022   Preoperative examination 05/27/2022   Abnormal endoscopy of upper gastrointestinal tract 05/27/2022   Benign neuroendocrine tumor of stomach  04/04/2022   Mucosal abnormality of stomach    Chronic osteomyelitis of pelvic region, right (HCC) 03/25/2022   External gastrointestinal fistula site infection 03/25/2022   Complicated urinary tract infection    Catheter-associated urinary tract infection 08/06/2021   Osteomyelitis of pelvic region, acute, right (HCC) 03/24/2021   Right ischial pressure sore 02/10/2021   Stage IV pressure ulcer of sacral region (HCC) 12/22/2020   Hypokalemia    Encounter for monitoring immunomodulating therapy 08/20/2020   High risk medication use 08/20/2020   Malnutrition of moderate degree 06/04/2020   Palliative care by specialist    Goals of care, counseling/discussion    FTT (failure to thrive) in adult 06/01/2020   Abdominal pain    Sacral ulcer, with necrosis of muscle (HCC) 04/09/2020   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HCC)    Transverse myelitis (HCC)    Labile blood pressure    Labile blood glucose    Neurogenic bladder    Neurogenic bowel    Transaminitis    Controlled type 2 diabetes mellitus with hyperglycemia, with long-term current use of insulin  (HCC)    Quadriplegia (HCC)    Dyslipidemia    Paraplegia (HCC)    CAD in native artery    Steroid-induced hyperglycemia    Diabetic peripheral neuropathy (HCC)    AKI (acute kidney injury)    Weakness 03/09/2020   Chest pain 03/08/2020   Right leg numbness    Malignant melanoma of left side of neck (HCC) 10/25/2018   Facial neuritis 10/25/2018   Myofascial pain syndrome 10/25/2018   Occipital neuralgia of left side 10/25/2018   Male hypogonadism 06/20/2016   Renal lesion 06/20/2016   Insomnia 08/07/2015   Enlarged prostate with lower urinary tract symptoms (LUTS) 11/07/2014   Balanitis 07/30/2012   Bladder neck obstruction 07/30/2012   Prostate nodule 06/18/2012   Recurrent  nephrolithiasis 06/18/2012   Benign neoplasm of colon 08/29/2011   DEPRESSION, SITUATIONAL, ACUTE 01/23/2010   Essential hypertension 05/30/2009   Coronary atherosclerosis 05/30/2009   Obstructive sleep apnea 11/28/2008   OBESITY 09/23/2008   ERECTILE DYSFUNCTION 11/26/2007   Well controlled type 2 diabetes mellitus with peripheral circulatory disorder (HCC) 05/09/2007   Hyperlipidemia 05/09/2007   MYOCARDIAL INFARCTION, HX OF 05/09/2007   DIVERTICULOSIS, COLON 05/09/2007   PCP:  Patient, No Pcp Per Pharmacy:  No Pharmacies Listed    Social Drivers of Health (SDOH) Social History: SDOH Screenings   Food Insecurity: No Food Insecurity (10/18/2024)  Housing: Low Risk (10/18/2024)  Transportation Needs: No Transportation Needs (10/18/2024)  Utilities: Not At Risk (10/18/2024)  Depression (PHQ2-9): Low Risk (12/21/2023)  Social Connections: Socially Isolated (10/18/2024)  Tobacco Use: Medium Risk (10/17/2024)   SDOH Interventions: Social Connections Interventions: Community Resources Provided, Inpatient TOC   Readmission Risk Interventions    07/31/2023    9:17 AM 07/02/2023   12:31 PM  Readmission Risk Prevention Plan  Transportation Screening Complete Complete  Medication Review Oceanographer) Complete Complete  PCP or Specialist appointment within 3-5 days of discharge Complete Not Complete  PCP/Specialist Appt Not Complete comments  SNF resident  [facility] or Home Care Consult Complete Complete  SW Recovery Care/Counseling Consult Complete Complete  Palliative Care Screening Not Applicable Not Applicable  Skilled Nursing Facility Complete Complete

## 2024-10-20 NOTE — Progress Notes (Signed)
 AVS is in packet and script

## 2024-10-21 DIAGNOSIS — L89154 Pressure ulcer of sacral region, stage 4: Secondary | ICD-10-CM | POA: Diagnosis not present

## 2024-10-21 DIAGNOSIS — Z794 Long term (current) use of insulin: Secondary | ICD-10-CM | POA: Diagnosis not present

## 2024-10-21 DIAGNOSIS — E1165 Type 2 diabetes mellitus with hyperglycemia: Secondary | ICD-10-CM | POA: Diagnosis not present

## 2024-10-21 DIAGNOSIS — R079 Chest pain, unspecified: Secondary | ICD-10-CM | POA: Diagnosis not present

## 2024-10-21 LAB — GLUCOSE, CAPILLARY: Glucose-Capillary: 154 mg/dL — ABNORMAL HIGH (ref 70–99)

## 2024-10-21 NOTE — Progress Notes (Signed)
 Given [facility] RN report. All questions answered.

## 2024-10-21 NOTE — Plan of Care (Signed)
" °  Problem: Education: Goal: Ability to describe self-care measures that may prevent or decrease complications (Diabetes Survival Skills Education) will improve Outcome: Progressing Goal: Individualized Educational Video(s) Outcome: Progressing   Problem: Skin Integrity: Goal: Risk for impaired skin integrity will decrease Outcome: Not Progressing   Problem: Nutrition: Goal: Adequate nutrition will be maintained Outcome: Progressing   "

## 2024-10-21 NOTE — TOC Progression Note (Signed)
 Transition of Care (TOC) - Progression Note    Patient Details  Name: Brent [name]. MRN: [id number] Date of Birth: Aug 01, 1946  [facility] CM/SW Contact  Isaiah Public, LCSWA Phone Number: 10/21/2024, 9:47 AM  Clinical Narrative:     Star with [facility] confirmed facility can accept patient today if medically ready.    Expected Discharge Plan: Skilled Nursing Facility Barriers to Discharge: No Barriers Identified               Expected Discharge Plan and Services       Living arrangements for the past 2 months: Skilled Nursing Facility Expected Discharge Date: 10/20/24                                     Social Drivers of Health (SDOH) Interventions SDOH Screenings   Food Insecurity: No Food Insecurity (10/18/2024)  Housing: Low Risk (10/18/2024)  Transportation Needs: No Transportation Needs (10/18/2024)  Utilities: Not At Risk (10/18/2024)  Depression (PHQ2-9): Low Risk (12/21/2023)  Social Connections: Socially Isolated (10/18/2024)  Tobacco Use: Medium Risk (10/20/2024)    Readmission Risk Interventions    07/31/2023    9:17 AM 07/02/2023   12:31 PM  Readmission Risk Prevention Plan  Transportation Screening Complete Complete  Medication Review (RN Care Manager) Complete Complete  PCP or Specialist appointment within 3-5 days of discharge Complete Not Complete  PCP/Specialist Appt Not Complete comments  SNF resident  HRI or Home Care Consult Complete Complete  SW Recovery Care/Counseling Consult Complete Complete  Palliative Care Screening Not Applicable Not Applicable  Skilled Nursing Facility Complete Complete

## 2024-10-22 ENCOUNTER — Telehealth: Payer: Self-pay | Admitting: *Deleted

## 2024-10-22 NOTE — Telephone Encounter (Signed)
-----   Message from Shirl Fruits, NP sent at 10/19/2024 10:52 AM EST ----- Regarding: stress PET Please arrange cardiac stress PET for this patient, per Dr Kate request.

## 2024-10-23 NOTE — Telephone Encounter (Signed)
 Spoke to patient.  Patient is aware his discharge  provider  Would like for him to have  a Cardiac Stress PET test.   He is aware radiology scheduling team will contact with the appointment .  RN  sent instruction for testing to patient via MyChart. Patient states he reviews his MyChart account.  Any question may call back or send a message by MyChart

## 2024-11-25 ENCOUNTER — Ambulatory Visit: Admitting: Nurse Practitioner
